# Patient Record
Sex: Male | Born: 1965 | Hispanic: No
Health system: Southern US, Community
[De-identification: ages and names within clinical notes are randomized; demographics above are authoritative.]

## PROBLEM LIST (undated history)

## (undated) DIAGNOSIS — F1910 Other psychoactive substance abuse, uncomplicated: Secondary | ICD-10-CM

## (undated) DIAGNOSIS — F319 Bipolar disorder, unspecified: Secondary | ICD-10-CM

## (undated) DIAGNOSIS — B1920 Unspecified viral hepatitis C without hepatic coma: Secondary | ICD-10-CM

## (undated) DIAGNOSIS — E039 Hypothyroidism, unspecified: Secondary | ICD-10-CM

## (undated) DIAGNOSIS — C73 Malignant neoplasm of thyroid gland: Secondary | ICD-10-CM

## (undated) DIAGNOSIS — E890 Postprocedural hypothyroidism: Secondary | ICD-10-CM

## (undated) HISTORY — DX: Other psychoactive substance abuse, uncomplicated: F19.10

## (undated) HISTORY — PX: ABDOMINAL SURGERY: SHX537

---

## 2000-12-03 ENCOUNTER — Ambulatory Visit (HOSPITAL_BASED_OUTPATIENT_CLINIC_OR_DEPARTMENT_OTHER): Admission: RE | Admit: 2000-12-03 | Discharge: 2000-12-03 | Payer: Self-pay | Admitting: Orthopedic Surgery

## 2006-07-18 ENCOUNTER — Ambulatory Visit: Payer: Self-pay | Admitting: Family Medicine

## 2006-07-25 ENCOUNTER — Ambulatory Visit: Payer: Self-pay | Admitting: Family Medicine

## 2006-08-11 ENCOUNTER — Ambulatory Visit: Payer: Self-pay | Admitting: Internal Medicine

## 2006-09-16 ENCOUNTER — Ambulatory Visit: Payer: Self-pay | Admitting: Gastroenterology

## 2006-11-06 ENCOUNTER — Ambulatory Visit: Payer: Self-pay | Admitting: Gastroenterology

## 2007-07-01 DIAGNOSIS — F319 Bipolar disorder, unspecified: Secondary | ICD-10-CM | POA: Insufficient documentation

## 2007-07-01 DIAGNOSIS — F329 Major depressive disorder, single episode, unspecified: Secondary | ICD-10-CM

## 2007-07-01 DIAGNOSIS — F411 Generalized anxiety disorder: Secondary | ICD-10-CM

## 2007-07-01 DIAGNOSIS — B1920 Unspecified viral hepatitis C without hepatic coma: Secondary | ICD-10-CM | POA: Insufficient documentation

## 2007-07-01 DIAGNOSIS — B1710 Acute hepatitis C without hepatic coma: Secondary | ICD-10-CM

## 2007-07-01 DIAGNOSIS — F4322 Adjustment disorder with anxiety: Secondary | ICD-10-CM | POA: Insufficient documentation

## 2009-05-19 ENCOUNTER — Emergency Department (HOSPITAL_COMMUNITY): Admission: EM | Admit: 2009-05-19 | Discharge: 2009-05-19 | Payer: Self-pay | Admitting: Emergency Medicine

## 2010-12-28 ENCOUNTER — Encounter: Payer: Self-pay | Admitting: Internal Medicine

## 2010-12-28 ENCOUNTER — Encounter (INDEPENDENT_AMBULATORY_CARE_PROVIDER_SITE_OTHER): Payer: Self-pay | Admitting: Internal Medicine

## 2011-01-15 NOTE — Assessment & Plan Note (Signed)
Summary: New Pt.    Vital Signs:  Patient profile:   45 year old male Height:      66.5 inches Weight:      168.25 pounds BMI:     26.85 Temp:     97.3 degrees F oral Pulse rate:   68 / minute Pulse rhythm:   regular Resp:     16 per minute BP sitting:   90 / 58  (left arm) Cuff size:   regular  Vitals Entered By: Hale Drone CMA (December 28, 2010 12:54 PM) CC: here to get established. Having right shoulder pain. Had an x-ray done last week. Needing dental referral.  Is Patient Diabetic? No Pain Assessment Patient in pain? yes     Location: right shoulder Intensity: 3 Type: aching/burning Onset of pain  Constant  Does patient need assistance? Functional Status Self care Ambulation Normal   CC:  here to get established. Having right shoulder pain. Had an x-ray done last week. Needing dental referral. .  History of Present Illness: Patient had to leave before could be seen by provider  Current Medications (verified): 1)  Wellbutrin Xl 150 Mg  Tb24 (Bupropion Hcl) .... Take 1 Tablet By Mouth Once A Day 2)  Xlapram 0.5mg  .... Take 1 Tablet By Mouth Two Times A Day 3)  Neurontin 300 Mg Caps (Gabapentin) .Marland Kitchen.. 1 Cap At Bedtime For 3 Days, Then 1 Twice A Day For 3 Days, Then 1 Cap 3 Times A Day [facility]) 4)  Trazodone Hcl 100 Mg Tabs (Trazodone Hcl) .... 1/2 To 1 Tab At Bedtime [facility]) 5)  Alprazolam 0.5 Mg Tabs (Alprazolam) .Marland Kitchen.. 1 Tab By Mouth Twice Daily [facility])  Allergies (verified): No Known Drug Allergies   Complete Medication List: 1)  Wellbutrin Xl 150 Mg Tb24 (Bupropion hcl) .... Take 1 tablet by mouth once a day 2)  Xlapram 0.5mg   .... Take 1 tablet by mouth two times a day 3)  Neurontin 300 Mg Caps (Gabapentin) .Marland Kitchen.. 1 cap at bedtime for 3 days, then 1 twice a day for 3 days, then 1 cap 3 times a day ([facility]) 4)  Trazodone Hcl 100 Mg Tabs (Trazodone hcl) .... 1/2 to 1 tab at bedtime ([facility]) 5)  Alprazolam 0.5 Mg  Tabs (Alprazolam) .Marland Kitchen.. 1 tab by mouth twice daily ([facility])    Not Administered:    Influenza Vaccine not given due to: declined
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
## 2011-04-05 NOTE — Assessment & Plan Note (Signed)
[facility]                           GASTROENTEROLOGY OFFICE NOTE   NAME:George Brady, George Brady                    MRN:          [id number]  DATE:08/11/2006                            DOB:          May 17, 1966    REASON FOR CONSULTATION:  Reflux disease.   HISTORY:  This is a 45 year old white male with recently diagnosed hepatitis  C and chronic problems with reflux disease and was referred through the  courtesy of Dr. Tawanna Cooler regarding the latter.  The patient reports a 15-year  history of problems with indigestion and heartburn.  More recently he has  had problems with early satiety over the past 6 months and about an 8-pound  weight loss.  He denies true dysphagia.  For his reflux, he is taking Pepcid  complete.  He states he has changed his diet as well as habits with some  improvement in symptoms.  He did fill a prescription for Prilosec but has  not begun the medication.   Review of Systems is unremarkable.  Rare problems with gas and constipation.  No melena or hematochezia.  He was recently establishing with Dr. Tawanna Cooler and  underwent blood work.  He was found to have abnormal hepatic transaminases  and requested hepatitis and HIV studies.  According to the patient, his  hepatitis C returned positive, and he is for additional evaluation.   Throughout the course of the interview, the patient reiterates that he does  not have health insurance and wishes not to incur any unnecessary costs  regarding his workup and treatment.  He also tells me that since changing  his eating habits, his symptoms have improved significant and wonders if he  might not just take some medication and forego any additional workup.   PAST MEDICAL HISTORY:  1. Recently diagnosed hepatitis C.  2. Anxiety and depression.  3. History of substance abuse.   PAST SURGICAL HISTORY:  1. Appendectomy.  2. Hernia repair.  3. Hemorrhoidectomy.   ALLERGIES:  No known drug  allergies.   CURRENT MEDICATIONS:  1. Wellbutrin XL 150 mg daily.  2. Alprazolam 0.5 mg b.i.d.  3. Motrin 600 mg b.i.d.  4. Multivitamins daily.  5. Fish oil daily.  6. Pepcid complete p.r.n.   FAMILY HISTORY:  No family history of gastrointestinal malignancy.   SOCIAL HISTORY:  The patient is single without children, lives with his  girlfriend.  He is a 12th grade graduate, self employed as a Music therapist.  He  smokes 1/2 pack of cigarettes per day, drinks a beer rarely, uses marijuana  daily, has used heroin in the past.   REVIEW OF SYSTEMS:  Per diagnostic evaluation form.   PHYSICAL EXAMINATION:  GENERAL: An anxious gentleman in no acute distress.  He is alert and oriented.  VITAL SIGNS: Blood pressure 100/60, heart rate 62 and regular, weight 156.6  pounds.  He is 5 feet 7-3/4 inches in height.  SKIN:  Exam reveals multiple tattoos on extremities.  HEENT:  Sclerae anicteric. Conjunctivae pink. Oral mucosa intact.  NECK:  No adenopathy.  LUNGS: Clear.  HEART: Regular.  ABDOMEN: Soft without tenderness,  mass, or hernia.  Good bowel sounds heard.  No organomegaly.  Prior surgical incision right lower quadrant is well  healed.  EXTREMITIES:  Without edema.   IMPRESSION:  1. Chronic gastroesophageal reflux disease.  GI symptoms of some concern      are early satiety and mild weight loss.  No true dysphagia.  He has not      initiated proton pump inhibitor therapy yet.  2. Recently diagnosed hepatitis C being evaluated with Dr. Tawanna Cooler.  3. History of depression and substance abuse.   RECOMMENDATIONS:  1. Initiate proton pump inhibitor therapy.  2. Discussion today regarding reflux disease and reflux precautions.      Literature provided.  3. Recommended upper endoscopy to exclude Barrett's esophagus, cancer, or      ulcer to account for some of his upper gastrointestinal symptoms.  The      patient did not wish to proceed due to cost concern.  We discussed      alternative  methods of diagnosis including upper GI series which may be      more cost effective but less sensitive.  He declined this as well and      really wanted to opt for empiric treatment of reflux disease with his      medication, ongoing adherence to reflux precautions, lifestyle      modifications, and address his issue of hepatitis with Dr. Tawanna Cooler.  I      respect his decision as he clearly understands underlying pathology      that could be overlooked.  I have given him additional literature      regarding Barrett's esophagus and upper endoscopy.  I have also      provided him with multiple samples of Prilosec 20 mg daily because of      his financial issues.   At this point, he will return to the care of Dr. Tawanna Cooler who I presume will  refer him onto the medical specialties clinic to address his hepatitis C.  He can feel free to return to this office at any time  should he be concerned about worsening or new symptoms or should he desire  screening endoscopy.                                   Wilhemina Bonito. Eda Keys., MD   JNP/MedQ  DD:  08/11/2006  DT:  08/13/2006  Job #:  [id number]   cc:   Tinnie Gens A. Tawanna Cooler, MD

## 2011-04-05 NOTE — Op Note (Signed)
[facility]  Patient:    George Brady, George Brady                    MRN: [id number] Proc. Date: 12/03/00 Attending:  Artist Pais. Mina Marble, M.D.                           Operative Report  PREOPERATIVE DIAGNOSIS:  Left index finger status post extensor tendon repair with retained suture and loss of terminal extension.  POSTOPERATIVE DIAGNOSIS:  Left index finger status post extensor tendon repair with retained suture and loss of terminal extension.  PROCEDURE:  Tenolysis extensor tendon left index finger as well as removal of suture left index finger.  SURGEON:  Artist Pais. Mina Marble, M.D.  ASSISTANT:  Junius Roads. Ireton, P.A.C.  ANESTHESIA:  General.  TOURNIQUET TIME:  30 minutes.  COMPLICATIONS:  None.  DRAINS:  None.  SPECIMENS:  None.  OPERATIVE REPORT:  Patient was taken to the operating room and after the induction of adequate general anesthesia the left upper extremity was prepped and draped in usual sterile fashion.  An Esmarch was used to exsanguinate the limb and the tourniquet was inflated to 250 mmHg.  At this point in time, using his old scar from his previous surgery, a large flap was elevated toward the ulnar side over the proximal interphalangeal joint where his extensor tendon repair had been performed.  Upon elevating the flap, there was significant amount of thick scar tissue over the extensor tendon and a sinus tract leading to the skin where some of the 4-0 Mersilene that had been used to repair his extensor tendon had worked its way toward the skin.  The sinus tract and the suture were carefully ellipsed out using the 15 blade and the sinus tract was dissected down to the repair site.  The remaining bits of 4-0 Mersilene were then carefully cut from the underlying extensor tendon.  At this point in time the tenolysis was performed using a 15 blade, excising the scar that was formed dorsally over the extensor tendon and then  over to the level of the PIP and NP joints subcutaneously.  Once this was done, hemostasis was achieved with bipolar cautery.  Irrigation was used to clean the wound and the wound was then closed with 5-0 nylon in a combination of simple and horizontal mattress sutures.  A sterile dressing of Xeroform, 4x4s, fluffs, and compressive dressing was applied.  The patient tolerated the procedure well, went to the recovery room in stable fashion. DD:  12/03/00 TD:  12/03/00 Job: [id number]

## 2011-10-22 ENCOUNTER — Emergency Department (HOSPITAL_BASED_OUTPATIENT_CLINIC_OR_DEPARTMENT_OTHER)
Admission: EM | Admit: 2011-10-22 | Discharge: 2011-10-22 | Disposition: A | Discharge status: Home / Self Care | Payer: Self-pay | Attending: Emergency Medicine | Admitting: Emergency Medicine

## 2011-10-22 DIAGNOSIS — F17200 Nicotine dependence, unspecified, uncomplicated: Secondary | ICD-10-CM | POA: Insufficient documentation

## 2011-10-22 DIAGNOSIS — K0381 Cracked tooth: Secondary | ICD-10-CM | POA: Insufficient documentation

## 2011-10-22 DIAGNOSIS — K089 Disorder of teeth and supporting structures, unspecified: Secondary | ICD-10-CM | POA: Insufficient documentation

## 2011-10-22 DIAGNOSIS — K0889 Other specified disorders of teeth and supporting structures: Secondary | ICD-10-CM

## 2011-10-22 MED ORDER — HYDROCODONE-ACETAMINOPHEN 5-325 MG PO TABS: 2.0000 | ORAL_TABLET | ORAL | Status: AC | PRN

## 2011-10-22 MED ORDER — PENICILLIN V POTASSIUM 500 MG PO TABS: 500.0000 mg | ORAL_TABLET | Freq: Four times a day (QID) | ORAL | Status: AC

## 2011-10-22 NOTE — ED Notes (Signed)
Pt reports chronic dental pain that worsened 2 days ago.

## 2011-10-22 NOTE — ED Provider Notes (Signed)
History     CSN: [id number] Arrival date & time: 10/22/2011  5:20 PM   First MD Initiated Contact with Patient 10/22/11 1738      Chief Complaint  Patient presents with  . Dental Pain    (Consider location/radiation/quality/duration/timing/severity/associated sxs/prior treatment) Patient is a 45 y.o. male presenting with tooth pain. The history is provided by the patient. No language interpreter was used.  Dental PainThe primary symptoms include mouth pain and dental injury. The symptoms began 2 days ago. The symptoms are worsening. The symptoms are new. The symptoms occur constantly.    History reviewed. No pertinent past medical history.  Past Surgical History  Procedure Date  . Abdominal surgery     No family history on file.  History  Substance Use Topics  . Smoking status: Current Everyday Smoker -- 0.5 packs/day  . Smokeless tobacco: Not on file  . Alcohol Use: Yes     occasional      Review of Systems  HENT: Positive for dental problem.   All other systems reviewed and are negative.    Allergies  Review of patient's allergies indicates no known allergies.  Home Medications   Current Outpatient Rx  Name Route Sig Dispense Refill  . BUPROPION HCL 100 MG PO TABS Oral Take by mouth 2 (two) times daily.        BP 103/42  Pulse 50  Temp(Src) 98.7 F (37.1 C) (Oral)  Resp 16  SpO2 98%  Physical Exam  Vitals reviewed. Constitutional: He appears well-developed and well-nourished.  HENT:  Head: Normocephalic.  Right Ear: External ear normal.  Left Ear: External ear normal.  Nose: Nose normal.  Mouth/Throat: Oropharynx is clear and moist.       Multiple broken teeth,    Eyes: Pupils are equal, round, and reactive to light.  Neck: Normal range of motion.  Cardiovascular: Normal rate and regular rhythm.   Pulmonary/Chest: Effort normal.  Abdominal: Soft.    ED Course  Procedures (including critical care time)  Labs Reviewed - No data to  display No results found.   No diagnosis found.    MDM  Pt given dental referral information        Langston Masker, [place] 10/22/11 4098

## 2011-10-23 NOTE — ED Provider Notes (Signed)
Medical screening examination/treatment/procedure(s) were performed by non-physician practitioner and as supervising physician I was immediately available for consultation/collaboration.   Nat Christen, MD 10/23/11 [phone]

## 2014-09-23 ENCOUNTER — Emergency Department (HOSPITAL_BASED_OUTPATIENT_CLINIC_OR_DEPARTMENT_OTHER): Payer: No Typology Code available for payment source

## 2014-09-23 ENCOUNTER — Emergency Department (HOSPITAL_BASED_OUTPATIENT_CLINIC_OR_DEPARTMENT_OTHER)
Admission: EM | Admit: 2014-09-23 | Discharge: 2014-09-23 | Disposition: A | Discharge status: Home / Self Care | Payer: No Typology Code available for payment source | Attending: Emergency Medicine | Admitting: Emergency Medicine

## 2014-09-23 ENCOUNTER — Encounter (HOSPITAL_BASED_OUTPATIENT_CLINIC_OR_DEPARTMENT_OTHER): Payer: Self-pay | Admitting: *Deleted

## 2014-09-23 DIAGNOSIS — F3110 Bipolar disorder, current episode manic without psychotic features, unspecified: Secondary | ICD-10-CM | POA: Insufficient documentation

## 2014-09-23 DIAGNOSIS — Y9389 Activity, other specified: Secondary | ICD-10-CM | POA: Insufficient documentation

## 2014-09-23 DIAGNOSIS — Y92410 Unspecified street and highway as the place of occurrence of the external cause: Secondary | ICD-10-CM | POA: Diagnosis not present

## 2014-09-23 DIAGNOSIS — M549 Dorsalgia, unspecified: Secondary | ICD-10-CM

## 2014-09-23 DIAGNOSIS — Z720 Tobacco use: Secondary | ICD-10-CM | POA: Insufficient documentation

## 2014-09-23 DIAGNOSIS — S3992XA Unspecified injury of lower back, initial encounter: Secondary | ICD-10-CM | POA: Insufficient documentation

## 2014-09-23 DIAGNOSIS — Z79899 Other long term (current) drug therapy: Secondary | ICD-10-CM | POA: Insufficient documentation

## 2014-09-23 HISTORY — DX: Bipolar disorder, unspecified: F31.9

## 2014-09-23 MED ORDER — IBUPROFEN 800 MG PO TABS
800.0000 mg | ORAL_TABLET | Freq: Once | ORAL | Status: AC
  Administered 2014-09-23: 800 mg via ORAL
  Filled 2014-09-23: qty 1

## 2014-09-23 MED ORDER — CYCLOBENZAPRINE HCL 10 MG PO TABS: 10.0000 mg | ORAL_TABLET | Freq: Two times a day (BID) | ORAL | Status: DC | PRN
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
## 2014-09-23 NOTE — ED Notes (Signed)
Unable to find pt in room. Pt left room to go outside and smoke he states when seen walking down hall by pharmacy.
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
## 2017-07-19 DIAGNOSIS — 419620001 Death: Secondary | SNOMED CT | POA: Diagnosis not present

## 2017-07-19 DEATH — deceased

## 2017-07-25 DIAGNOSIS — I10 Essential (primary) hypertension: Secondary | ICD-10-CM | POA: Diagnosis not present

## 2017-07-25 DIAGNOSIS — L908 Other atrophic disorders of skin: Secondary | ICD-10-CM | POA: Diagnosis not present

## 2017-07-25 DIAGNOSIS — H02834 Dermatochalasis of left upper eyelid: Secondary | ICD-10-CM | POA: Diagnosis not present

## 2017-07-25 DIAGNOSIS — H02831 Dermatochalasis of right upper eyelid: Secondary | ICD-10-CM | POA: Diagnosis not present

## 2017-08-18 DIAGNOSIS — 419620001 Death: Secondary | SNOMED CT | POA: Diagnosis not present

## 2017-08-18 DEATH — deceased

## 2017-09-18 DIAGNOSIS — 419620001 Death: Secondary | SNOMED CT | POA: Diagnosis not present

## 2017-09-18 DEATH — deceased

## 2017-10-18 DIAGNOSIS — 419620001 Death: Secondary | SNOMED CT | POA: Diagnosis not present

## 2017-10-18 DEATH — deceased

## 2017-11-18 DIAGNOSIS — 419620001 Death: Secondary | SNOMED CT | POA: Diagnosis not present

## 2017-11-18 DEATH — deceased

## 2017-12-12 DIAGNOSIS — J3089 Other allergic rhinitis: Secondary | ICD-10-CM | POA: Diagnosis not present

## 2017-12-12 DIAGNOSIS — J301 Allergic rhinitis due to pollen: Secondary | ICD-10-CM | POA: Diagnosis not present

## 2017-12-12 DIAGNOSIS — J3081 Allergic rhinitis due to animal (cat) (dog) hair and dander: Secondary | ICD-10-CM | POA: Diagnosis not present

## 2017-12-29 DIAGNOSIS — L97921 Non-pressure chronic ulcer of unspecified part of left lower leg limited to breakdown of skin: Secondary | ICD-10-CM | POA: Diagnosis not present

## 2017-12-29 DIAGNOSIS — I83228 Varicose veins of left lower extremity with both ulcer of other part of lower extremity and inflammation: Secondary | ICD-10-CM | POA: Diagnosis not present

## 2018-03-05 DIAGNOSIS — J3089 Other allergic rhinitis: Secondary | ICD-10-CM | POA: Diagnosis not present

## 2018-03-05 DIAGNOSIS — J301 Allergic rhinitis due to pollen: Secondary | ICD-10-CM | POA: Diagnosis not present

## 2018-03-05 DIAGNOSIS — J3081 Allergic rhinitis due to animal (cat) (dog) hair and dander: Secondary | ICD-10-CM | POA: Diagnosis not present

## 2018-03-16 DIAGNOSIS — H401123 Primary open-angle glaucoma, left eye, severe stage: Secondary | ICD-10-CM | POA: Diagnosis not present

## 2018-03-16 DIAGNOSIS — I10 Essential (primary) hypertension: Secondary | ICD-10-CM | POA: Diagnosis not present

## 2018-03-16 DIAGNOSIS — H2512 Age-related nuclear cataract, left eye: Secondary | ICD-10-CM | POA: Diagnosis not present

## 2018-03-18 DIAGNOSIS — K317 Polyp of stomach and duodenum: Secondary | ICD-10-CM | POA: Diagnosis not present

## 2018-03-30 DIAGNOSIS — I2510 Atherosclerotic heart disease of native coronary artery without angina pectoris: Secondary | ICD-10-CM | POA: Diagnosis not present

## 2018-03-30 DIAGNOSIS — I442 Atrioventricular block, complete: Secondary | ICD-10-CM | POA: Diagnosis not present

## 2018-03-30 DIAGNOSIS — I5023 Acute on chronic systolic (congestive) heart failure: Secondary | ICD-10-CM | POA: Diagnosis not present

## 2018-04-10 DIAGNOSIS — S8010XA Contusion of unspecified lower leg, initial encounter: Secondary | ICD-10-CM | POA: Diagnosis not present

## 2018-04-16 DIAGNOSIS — G40219 Localization-related (focal) (partial) symptomatic epilepsy and epileptic syndromes with complex partial seizures, intractable, without status epilepticus: Secondary | ICD-10-CM | POA: Diagnosis not present

## 2018-04-16 DIAGNOSIS — I2510 Atherosclerotic heart disease of native coronary artery without angina pectoris: Secondary | ICD-10-CM | POA: Diagnosis not present

## 2018-04-16 DIAGNOSIS — I481 Persistent atrial fibrillation: Secondary | ICD-10-CM | POA: Diagnosis not present

## 2018-04-16 DIAGNOSIS — E669 Obesity, unspecified: Secondary | ICD-10-CM | POA: Diagnosis not present

## 2018-04-16 DIAGNOSIS — I483 Typical atrial flutter: Secondary | ICD-10-CM | POA: Diagnosis not present

## 2018-04-21 DIAGNOSIS — I2510 Atherosclerotic heart disease of native coronary artery without angina pectoris: Secondary | ICD-10-CM | POA: Diagnosis not present

## 2018-04-21 DIAGNOSIS — I671 Cerebral aneurysm, nonruptured: Secondary | ICD-10-CM | POA: Diagnosis not present

## 2018-04-21 DIAGNOSIS — G40219 Localization-related (focal) (partial) symptomatic epilepsy and epileptic syndromes with complex partial seizures, intractable, without status epilepticus: Secondary | ICD-10-CM | POA: Diagnosis not present

## 2018-04-24 DIAGNOSIS — J3081 Allergic rhinitis due to animal (cat) (dog) hair and dander: Secondary | ICD-10-CM | POA: Diagnosis not present

## 2018-04-24 DIAGNOSIS — J3089 Other allergic rhinitis: Secondary | ICD-10-CM | POA: Diagnosis not present

## 2018-04-24 DIAGNOSIS — J301 Allergic rhinitis due to pollen: Secondary | ICD-10-CM | POA: Diagnosis not present

## 2018-04-27 DIAGNOSIS — H2511 Age-related nuclear cataract, right eye: Secondary | ICD-10-CM | POA: Diagnosis not present

## 2018-04-27 DIAGNOSIS — E1122 Type 2 diabetes mellitus with diabetic chronic kidney disease: Secondary | ICD-10-CM | POA: Diagnosis not present

## 2018-04-27 DIAGNOSIS — N182 Chronic kidney disease, stage 2 (mild): Secondary | ICD-10-CM | POA: Diagnosis not present

## 2018-04-27 DIAGNOSIS — Z7984 Long term (current) use of oral hypoglycemic drugs: Secondary | ICD-10-CM | POA: Diagnosis not present

## 2018-04-27 DIAGNOSIS — I10 Essential (primary) hypertension: Secondary | ICD-10-CM | POA: Diagnosis not present

## 2018-05-06 DIAGNOSIS — I480 Paroxysmal atrial fibrillation: Secondary | ICD-10-CM | POA: Diagnosis not present

## 2018-05-06 DIAGNOSIS — I5022 Chronic systolic (congestive) heart failure: Secondary | ICD-10-CM | POA: Diagnosis not present

## 2018-05-06 DIAGNOSIS — I110 Hypertensive heart disease with heart failure: Secondary | ICD-10-CM | POA: Diagnosis not present

## 2018-05-08 DIAGNOSIS — B9689 Other specified bacterial agents as the cause of diseases classified elsewhere: Secondary | ICD-10-CM | POA: Diagnosis not present

## 2018-05-08 DIAGNOSIS — Z944 Liver transplant status: Secondary | ICD-10-CM | POA: Diagnosis not present

## 2018-05-08 DIAGNOSIS — T380X5A Adverse effect of glucocorticoids and synthetic analogues, initial encounter: Secondary | ICD-10-CM | POA: Diagnosis not present

## 2018-05-14 DIAGNOSIS — M47897 Other spondylosis, lumbosacral region: Secondary | ICD-10-CM | POA: Diagnosis not present

## 2018-05-14 DIAGNOSIS — M48061 Spinal stenosis, lumbar region without neurogenic claudication: Secondary | ICD-10-CM | POA: Diagnosis not present

## 2018-05-14 DIAGNOSIS — J449 Chronic obstructive pulmonary disease, unspecified: Secondary | ICD-10-CM | POA: Diagnosis not present

## 2018-05-22 DIAGNOSIS — I272 Pulmonary hypertension, unspecified: Secondary | ICD-10-CM | POA: Diagnosis not present

## 2018-05-22 DIAGNOSIS — I110 Hypertensive heart disease with heart failure: Secondary | ICD-10-CM | POA: Diagnosis not present

## 2018-05-22 DIAGNOSIS — H268 Other specified cataract: Secondary | ICD-10-CM | POA: Diagnosis not present

## 2018-05-22 DIAGNOSIS — H52222 Regular astigmatism, left eye: Secondary | ICD-10-CM | POA: Diagnosis not present

## 2018-05-22 DIAGNOSIS — I509 Heart failure, unspecified: Secondary | ICD-10-CM | POA: Diagnosis not present

## 2018-05-26 DIAGNOSIS — T8453XD Infection and inflammatory reaction due to internal right knee prosthesis, subsequent encounter: Secondary | ICD-10-CM | POA: Diagnosis not present

## 2018-05-26 DIAGNOSIS — I5032 Chronic diastolic (congestive) heart failure: Secondary | ICD-10-CM | POA: Diagnosis not present

## 2018-05-26 DIAGNOSIS — M6281 Muscle weakness (generalized): Secondary | ICD-10-CM | POA: Diagnosis not present

## 2018-05-26 DIAGNOSIS — I959 Hypotension, unspecified: Secondary | ICD-10-CM | POA: Diagnosis not present

## 2018-05-26 DIAGNOSIS — I4891 Unspecified atrial fibrillation: Secondary | ICD-10-CM | POA: Diagnosis not present

## 2018-05-26 DIAGNOSIS — I25110 Atherosclerotic heart disease of native coronary artery with unstable angina pectoris: Secondary | ICD-10-CM | POA: Diagnosis not present

## 2018-05-26 DIAGNOSIS — E1165 Type 2 diabetes mellitus with hyperglycemia: Secondary | ICD-10-CM | POA: Diagnosis not present

## 2018-05-26 DIAGNOSIS — Z792 Long term (current) use of antibiotics: Secondary | ICD-10-CM | POA: Diagnosis not present

## 2018-05-26 DIAGNOSIS — D682 Hereditary deficiency of other clotting factors: Secondary | ICD-10-CM | POA: Diagnosis not present

## 2018-06-04 DIAGNOSIS — I10 Essential (primary) hypertension: Secondary | ICD-10-CM | POA: Diagnosis not present

## 2018-06-04 DIAGNOSIS — D125 Benign neoplasm of sigmoid colon: Secondary | ICD-10-CM | POA: Diagnosis not present

## 2018-06-04 DIAGNOSIS — K5730 Diverticulosis of large intestine without perforation or abscess without bleeding: Secondary | ICD-10-CM | POA: Diagnosis not present

## 2018-06-05 DIAGNOSIS — E039 Hypothyroidism, unspecified: Secondary | ICD-10-CM | POA: Diagnosis not present

## 2018-06-05 DIAGNOSIS — I2724 Chronic thromboembolic pulmonary hypertension: Secondary | ICD-10-CM | POA: Diagnosis not present

## 2018-06-05 DIAGNOSIS — G40219 Localization-related (focal) (partial) symptomatic epilepsy and epileptic syndromes with complex partial seizures, intractable, without status epilepticus: Secondary | ICD-10-CM | POA: Diagnosis not present

## 2018-06-05 DIAGNOSIS — G4733 Obstructive sleep apnea (adult) (pediatric): Secondary | ICD-10-CM | POA: Diagnosis not present

## 2018-06-05 DIAGNOSIS — R918 Other nonspecific abnormal finding of lung field: Secondary | ICD-10-CM | POA: Diagnosis not present

## 2018-06-08 DIAGNOSIS — I480 Paroxysmal atrial fibrillation: Secondary | ICD-10-CM | POA: Diagnosis not present

## 2018-06-10 DIAGNOSIS — I2510 Atherosclerotic heart disease of native coronary artery without angina pectoris: Secondary | ICD-10-CM | POA: Diagnosis not present

## 2018-06-10 DIAGNOSIS — I481 Persistent atrial fibrillation: Secondary | ICD-10-CM | POA: Diagnosis not present

## 2018-06-10 DIAGNOSIS — J449 Chronic obstructive pulmonary disease, unspecified: Secondary | ICD-10-CM | POA: Diagnosis not present

## 2018-06-23 DIAGNOSIS — I481 Persistent atrial fibrillation: Secondary | ICD-10-CM | POA: Diagnosis not present

## 2018-06-23 DIAGNOSIS — I2510 Atherosclerotic heart disease of native coronary artery without angina pectoris: Secondary | ICD-10-CM | POA: Diagnosis not present

## 2018-06-23 DIAGNOSIS — E785 Hyperlipidemia, unspecified: Secondary | ICD-10-CM | POA: Diagnosis not present

## 2018-07-06 DIAGNOSIS — C787 Secondary malignant neoplasm of liver and intrahepatic bile duct: Secondary | ICD-10-CM | POA: Diagnosis not present

## 2018-07-06 DIAGNOSIS — C3490 Malignant neoplasm of unspecified part of unspecified bronchus or lung: Secondary | ICD-10-CM | POA: Diagnosis not present

## 2018-07-07 DIAGNOSIS — K1321 Leukoplakia of oral mucosa, including tongue: Secondary | ICD-10-CM | POA: Diagnosis not present

## 2018-07-21 DIAGNOSIS — E785 Hyperlipidemia, unspecified: Secondary | ICD-10-CM | POA: Diagnosis not present

## 2018-07-21 DIAGNOSIS — I481 Persistent atrial fibrillation: Secondary | ICD-10-CM | POA: Diagnosis not present

## 2018-07-21 DIAGNOSIS — Z7901 Long term (current) use of anticoagulants: Secondary | ICD-10-CM | POA: Diagnosis not present

## 2018-07-22 DIAGNOSIS — J449 Chronic obstructive pulmonary disease, unspecified: Secondary | ICD-10-CM | POA: Diagnosis not present

## 2018-07-22 DIAGNOSIS — I471 Supraventricular tachycardia: Secondary | ICD-10-CM | POA: Diagnosis not present

## 2018-07-22 DIAGNOSIS — E782 Mixed hyperlipidemia: Secondary | ICD-10-CM | POA: Diagnosis not present

## 2018-07-22 DIAGNOSIS — I255 Ischemic cardiomyopathy: Secondary | ICD-10-CM | POA: Diagnosis not present

## 2018-08-06 DIAGNOSIS — K449 Diaphragmatic hernia without obstruction or gangrene: Secondary | ICD-10-CM | POA: Diagnosis not present

## 2018-08-06 DIAGNOSIS — K317 Polyp of stomach and duodenum: Secondary | ICD-10-CM | POA: Diagnosis not present

## 2018-08-06 DIAGNOSIS — K2940 Chronic atrophic gastritis without bleeding: Secondary | ICD-10-CM | POA: Diagnosis not present

## 2018-08-12 DIAGNOSIS — K5730 Diverticulosis of large intestine without perforation or abscess without bleeding: Secondary | ICD-10-CM | POA: Diagnosis not present

## 2018-08-12 DIAGNOSIS — K648 Other hemorrhoids: Secondary | ICD-10-CM | POA: Diagnosis not present

## 2018-08-12 DIAGNOSIS — K635 Polyp of colon: Secondary | ICD-10-CM | POA: Diagnosis not present

## 2018-08-24 DIAGNOSIS — H401423 Capsular glaucoma with pseudoexfoliation of lens, left eye, severe stage: Secondary | ICD-10-CM | POA: Diagnosis not present

## 2018-09-02 DIAGNOSIS — C3431 Malignant neoplasm of lower lobe, right bronchus or lung: Secondary | ICD-10-CM | POA: Diagnosis not present

## 2018-09-03 DIAGNOSIS — J449 Chronic obstructive pulmonary disease, unspecified: Secondary | ICD-10-CM | POA: Diagnosis not present

## 2018-09-10 DIAGNOSIS — K831 Obstruction of bile duct: Secondary | ICD-10-CM | POA: Diagnosis not present

## 2018-09-10 DIAGNOSIS — R17 Unspecified jaundice: Secondary | ICD-10-CM | POA: Diagnosis not present

## 2018-09-10 DIAGNOSIS — R935 Abnormal findings on diagnostic imaging of other abdominal regions, including retroperitoneum: Secondary | ICD-10-CM | POA: Diagnosis not present

## 2018-09-10 DIAGNOSIS — R932 Abnormal findings on diagnostic imaging of liver and biliary tract: Secondary | ICD-10-CM | POA: Diagnosis not present

## 2018-09-14 DIAGNOSIS — N201 Calculus of ureter: Secondary | ICD-10-CM | POA: Diagnosis not present

## 2018-09-14 DIAGNOSIS — I2510 Atherosclerotic heart disease of native coronary artery without angina pectoris: Secondary | ICD-10-CM | POA: Diagnosis not present

## 2018-09-16 DIAGNOSIS — I4819 Other persistent atrial fibrillation: Secondary | ICD-10-CM | POA: Diagnosis not present

## 2018-09-16 DIAGNOSIS — E785 Hyperlipidemia, unspecified: Secondary | ICD-10-CM | POA: Diagnosis not present

## 2018-09-16 DIAGNOSIS — I472 Ventricular tachycardia: Secondary | ICD-10-CM | POA: Diagnosis not present

## 2018-09-17 DIAGNOSIS — E119 Type 2 diabetes mellitus without complications: Secondary | ICD-10-CM | POA: Diagnosis not present

## 2018-09-17 DIAGNOSIS — I10 Essential (primary) hypertension: Secondary | ICD-10-CM | POA: Diagnosis not present

## 2018-09-17 DIAGNOSIS — I442 Atrioventricular block, complete: Secondary | ICD-10-CM | POA: Diagnosis not present

## 2018-09-18 DIAGNOSIS — K831 Obstruction of bile duct: Secondary | ICD-10-CM | POA: Diagnosis not present

## 2018-09-18 DIAGNOSIS — Z9689 Presence of other specified functional implants: Secondary | ICD-10-CM | POA: Diagnosis not present

## 2018-09-18 DIAGNOSIS — R933 Abnormal findings on diagnostic imaging of other parts of digestive tract: Secondary | ICD-10-CM | POA: Diagnosis not present

## 2018-09-18 DIAGNOSIS — K8689 Other specified diseases of pancreas: Secondary | ICD-10-CM | POA: Diagnosis not present

## 2018-09-24 DIAGNOSIS — I129 Hypertensive chronic kidney disease with stage 1 through stage 4 chronic kidney disease, or unspecified chronic kidney disease: Secondary | ICD-10-CM | POA: Diagnosis not present

## 2018-09-24 DIAGNOSIS — N189 Chronic kidney disease, unspecified: Secondary | ICD-10-CM | POA: Diagnosis not present

## 2018-09-24 DIAGNOSIS — I4811 Longstanding persistent atrial fibrillation: Secondary | ICD-10-CM | POA: Diagnosis not present

## 2018-09-24 DIAGNOSIS — I2510 Atherosclerotic heart disease of native coronary artery without angina pectoris: Secondary | ICD-10-CM | POA: Diagnosis not present

## 2018-09-25 DIAGNOSIS — Z9689 Presence of other specified functional implants: Secondary | ICD-10-CM | POA: Diagnosis not present

## 2018-09-25 DIAGNOSIS — R17 Unspecified jaundice: Secondary | ICD-10-CM | POA: Diagnosis not present

## 2018-09-30 DIAGNOSIS — E785 Hyperlipidemia, unspecified: Secondary | ICD-10-CM | POA: Diagnosis not present

## 2018-09-30 DIAGNOSIS — M5020 Other cervical disc displacement, unspecified cervical region: Secondary | ICD-10-CM | POA: Diagnosis not present

## 2018-10-04 DIAGNOSIS — R0989 Other specified symptoms and signs involving the circulatory and respiratory systems: Secondary | ICD-10-CM | POA: Diagnosis not present

## 2018-10-04 DIAGNOSIS — R51 Headache: Secondary | ICD-10-CM | POA: Diagnosis not present

## 2018-10-04 DIAGNOSIS — Z462 Encounter for fitting and adjustment of other devices related to nervous system and special senses: Secondary | ICD-10-CM | POA: Diagnosis not present

## 2018-10-19 DIAGNOSIS — I10 Essential (primary) hypertension: Secondary | ICD-10-CM | POA: Diagnosis not present

## 2018-10-19 DIAGNOSIS — H401113 Primary open-angle glaucoma, right eye, severe stage: Secondary | ICD-10-CM | POA: Diagnosis not present

## 2018-10-22 DIAGNOSIS — R5381 Other malaise: Secondary | ICD-10-CM | POA: Diagnosis not present

## 2018-10-22 DIAGNOSIS — W19XXXA Unspecified fall, initial encounter: Secondary | ICD-10-CM | POA: Diagnosis not present

## 2018-10-22 DIAGNOSIS — R279 Unspecified lack of coordination: Secondary | ICD-10-CM | POA: Diagnosis not present

## 2018-10-22 DIAGNOSIS — Z743 Need for continuous supervision: Secondary | ICD-10-CM | POA: Diagnosis not present

## 2018-11-04 DIAGNOSIS — R404 Transient alteration of awareness: Secondary | ICD-10-CM | POA: Diagnosis not present

## 2018-11-04 DIAGNOSIS — M2550 Pain in unspecified joint: Secondary | ICD-10-CM | POA: Diagnosis not present

## 2018-11-04 DIAGNOSIS — Z7401 Bed confinement status: Secondary | ICD-10-CM | POA: Diagnosis not present

## 2018-12-01 DIAGNOSIS — J449 Chronic obstructive pulmonary disease, unspecified: Secondary | ICD-10-CM | POA: Diagnosis not present

## 2018-12-05 DIAGNOSIS — M6281 Muscle weakness (generalized): Secondary | ICD-10-CM | POA: Diagnosis not present

## 2018-12-05 DIAGNOSIS — F411 Generalized anxiety disorder: Secondary | ICD-10-CM | POA: Diagnosis not present

## 2018-12-05 DIAGNOSIS — R269 Unspecified abnormalities of gait and mobility: Secondary | ICD-10-CM | POA: Diagnosis not present

## 2018-12-05 DIAGNOSIS — G20 Parkinson's disease: Secondary | ICD-10-CM | POA: Diagnosis not present

## 2018-12-05 DIAGNOSIS — I10 Essential (primary) hypertension: Secondary | ICD-10-CM | POA: Diagnosis not present

## 2018-12-05 DIAGNOSIS — R278 Other lack of coordination: Secondary | ICD-10-CM | POA: Diagnosis not present

## 2018-12-15 DIAGNOSIS — D631 Anemia in chronic kidney disease: Secondary | ICD-10-CM | POA: Diagnosis not present

## 2018-12-15 DIAGNOSIS — Z6827 Body mass index (BMI) 27.0-27.9, adult: Secondary | ICD-10-CM | POA: Diagnosis not present

## 2018-12-15 DIAGNOSIS — N2581 Secondary hyperparathyroidism of renal origin: Secondary | ICD-10-CM | POA: Diagnosis not present

## 2018-12-15 DIAGNOSIS — I129 Hypertensive chronic kidney disease with stage 1 through stage 4 chronic kidney disease, or unspecified chronic kidney disease: Secondary | ICD-10-CM | POA: Diagnosis not present

## 2018-12-15 DIAGNOSIS — Z1331 Encounter for screening for depression: Secondary | ICD-10-CM | POA: Diagnosis not present

## 2018-12-15 DIAGNOSIS — Z0000 Encounter for general adult medical examination without abnormal findings: Secondary | ICD-10-CM | POA: Diagnosis not present

## 2018-12-15 DIAGNOSIS — I4510 Unspecified right bundle-branch block: Secondary | ICD-10-CM | POA: Diagnosis not present

## 2018-12-15 DIAGNOSIS — Z1339 Encounter for screening examination for other mental health and behavioral disorders: Secondary | ICD-10-CM | POA: Diagnosis not present

## 2018-12-15 DIAGNOSIS — I10 Essential (primary) hypertension: Secondary | ICD-10-CM | POA: Diagnosis not present

## 2018-12-15 DIAGNOSIS — E559 Vitamin D deficiency, unspecified: Secondary | ICD-10-CM | POA: Diagnosis not present

## 2018-12-15 DIAGNOSIS — N184 Chronic kidney disease, stage 4 (severe): Secondary | ICD-10-CM | POA: Diagnosis not present

## 2018-12-22 DIAGNOSIS — Z0000 Encounter for general adult medical examination without abnormal findings: Secondary | ICD-10-CM | POA: Diagnosis not present

## 2018-12-22 DIAGNOSIS — L408 Other psoriasis: Secondary | ICD-10-CM | POA: Diagnosis not present

## 2018-12-22 DIAGNOSIS — Z1331 Encounter for screening for depression: Secondary | ICD-10-CM | POA: Diagnosis not present

## 2018-12-22 DIAGNOSIS — I10 Essential (primary) hypertension: Secondary | ICD-10-CM | POA: Diagnosis not present

## 2018-12-22 DIAGNOSIS — E7849 Other hyperlipidemia: Secondary | ICD-10-CM | POA: Diagnosis not present

## 2018-12-22 DIAGNOSIS — K900 Celiac disease: Secondary | ICD-10-CM | POA: Diagnosis not present

## 2018-12-22 DIAGNOSIS — J449 Chronic obstructive pulmonary disease, unspecified: Secondary | ICD-10-CM | POA: Diagnosis not present

## 2018-12-22 DIAGNOSIS — Z681 Body mass index (BMI) 19 or less, adult: Secondary | ICD-10-CM | POA: Diagnosis not present

## 2018-12-22 DIAGNOSIS — M859 Disorder of bone density and structure, unspecified: Secondary | ICD-10-CM | POA: Diagnosis not present

## 2018-12-22 DIAGNOSIS — N183 Chronic kidney disease, stage 3 (moderate): Secondary | ICD-10-CM | POA: Diagnosis not present

## 2018-12-22 DIAGNOSIS — I129 Hypertensive chronic kidney disease with stage 1 through stage 4 chronic kidney disease, or unspecified chronic kidney disease: Secondary | ICD-10-CM | POA: Diagnosis not present

## 2018-12-22 DIAGNOSIS — Z1339 Encounter for screening examination for other mental health and behavioral disorders: Secondary | ICD-10-CM | POA: Diagnosis not present

## 2018-12-23 DIAGNOSIS — R404 Transient alteration of awareness: Secondary | ICD-10-CM | POA: Diagnosis not present

## 2018-12-23 DIAGNOSIS — I469 Cardiac arrest, cause unspecified: Secondary | ICD-10-CM | POA: Diagnosis not present

## 2018-12-28 DIAGNOSIS — I872 Venous insufficiency (chronic) (peripheral): Secondary | ICD-10-CM | POA: Diagnosis not present

## 2018-12-28 DIAGNOSIS — Z6827 Body mass index (BMI) 27.0-27.9, adult: Secondary | ICD-10-CM | POA: Diagnosis not present

## 2018-12-28 DIAGNOSIS — N183 Chronic kidney disease, stage 3 (moderate): Secondary | ICD-10-CM | POA: Diagnosis not present

## 2018-12-28 DIAGNOSIS — Z1331 Encounter for screening for depression: Secondary | ICD-10-CM | POA: Diagnosis not present

## 2018-12-28 DIAGNOSIS — M1990 Unspecified osteoarthritis, unspecified site: Secondary | ICD-10-CM | POA: Diagnosis not present

## 2018-12-28 DIAGNOSIS — I129 Hypertensive chronic kidney disease with stage 1 through stage 4 chronic kidney disease, or unspecified chronic kidney disease: Secondary | ICD-10-CM | POA: Diagnosis not present

## 2018-12-28 DIAGNOSIS — Z1339 Encounter for screening examination for other mental health and behavioral disorders: Secondary | ICD-10-CM | POA: Diagnosis not present

## 2018-12-28 DIAGNOSIS — Z0000 Encounter for general adult medical examination without abnormal findings: Secondary | ICD-10-CM | POA: Diagnosis not present

## 2018-12-28 DIAGNOSIS — I8390 Asymptomatic varicose veins of unspecified lower extremity: Secondary | ICD-10-CM | POA: Diagnosis not present

## 2018-12-28 DIAGNOSIS — M419 Scoliosis, unspecified: Secondary | ICD-10-CM | POA: Diagnosis not present

## 2018-12-28 DIAGNOSIS — R5383 Other fatigue: Secondary | ICD-10-CM | POA: Diagnosis not present

## 2018-12-28 DIAGNOSIS — M545 Low back pain: Secondary | ICD-10-CM | POA: Diagnosis not present

## 2018-12-30 DIAGNOSIS — Z6824 Body mass index (BMI) 24.0-24.9, adult: Secondary | ICD-10-CM | POA: Diagnosis not present

## 2018-12-30 DIAGNOSIS — Z01419 Encounter for gynecological examination (general) (routine) without abnormal findings: Secondary | ICD-10-CM | POA: Diagnosis not present

## 2018-12-30 DIAGNOSIS — Z1211 Encounter for screening for malignant neoplasm of colon: Secondary | ICD-10-CM | POA: Diagnosis not present

## 2018-12-30 DIAGNOSIS — Z79890 Hormone replacement therapy (postmenopausal): Secondary | ICD-10-CM | POA: Diagnosis not present

## 2018-12-30 DIAGNOSIS — R928 Other abnormal and inconclusive findings on diagnostic imaging of breast: Secondary | ICD-10-CM | POA: Diagnosis not present

## 2018-12-30 DIAGNOSIS — Z09 Encounter for follow-up examination after completed treatment for conditions other than malignant neoplasm: Secondary | ICD-10-CM | POA: Diagnosis not present

## 2018-12-31 DIAGNOSIS — Z961 Presence of intraocular lens: Secondary | ICD-10-CM | POA: Diagnosis not present

## 2019-01-02 DIAGNOSIS — I739 Peripheral vascular disease, unspecified: Secondary | ICD-10-CM | POA: Diagnosis not present

## 2019-01-02 DIAGNOSIS — F0391 Unspecified dementia with behavioral disturbance: Secondary | ICD-10-CM | POA: Diagnosis not present

## 2019-01-02 DIAGNOSIS — R262 Difficulty in walking, not elsewhere classified: Secondary | ICD-10-CM | POA: Diagnosis not present

## 2019-01-02 DIAGNOSIS — E441 Mild protein-calorie malnutrition: Secondary | ICD-10-CM | POA: Diagnosis not present

## 2019-01-04 DIAGNOSIS — I499 Cardiac arrhythmia, unspecified: Secondary | ICD-10-CM | POA: Diagnosis not present

## 2019-01-04 DIAGNOSIS — R001 Bradycardia, unspecified: Secondary | ICD-10-CM | POA: Diagnosis not present

## 2019-01-04 DIAGNOSIS — R404 Transient alteration of awareness: Secondary | ICD-10-CM | POA: Diagnosis not present

## 2019-01-05 DIAGNOSIS — I499 Cardiac arrhythmia, unspecified: Secondary | ICD-10-CM | POA: Diagnosis not present

## 2019-01-05 DIAGNOSIS — R404 Transient alteration of awareness: Secondary | ICD-10-CM | POA: Diagnosis not present

## 2019-01-05 DIAGNOSIS — R001 Bradycardia, unspecified: Secondary | ICD-10-CM | POA: Diagnosis not present

## 2019-01-11 DIAGNOSIS — E538 Deficiency of other specified B group vitamins: Secondary | ICD-10-CM | POA: Diagnosis not present

## 2019-01-11 DIAGNOSIS — Z6822 Body mass index (BMI) 22.0-22.9, adult: Secondary | ICD-10-CM | POA: Diagnosis not present

## 2019-01-11 DIAGNOSIS — N401 Enlarged prostate with lower urinary tract symptoms: Secondary | ICD-10-CM | POA: Diagnosis not present

## 2019-01-11 DIAGNOSIS — Z1339 Encounter for screening examination for other mental health and behavioral disorders: Secondary | ICD-10-CM | POA: Diagnosis not present

## 2019-01-11 DIAGNOSIS — C189 Malignant neoplasm of colon, unspecified: Secondary | ICD-10-CM | POA: Diagnosis not present

## 2019-01-11 DIAGNOSIS — I10 Essential (primary) hypertension: Secondary | ICD-10-CM | POA: Diagnosis not present

## 2019-01-11 DIAGNOSIS — Z1331 Encounter for screening for depression: Secondary | ICD-10-CM | POA: Diagnosis not present

## 2019-01-11 DIAGNOSIS — D6489 Other specified anemias: Secondary | ICD-10-CM | POA: Diagnosis not present

## 2019-01-11 DIAGNOSIS — Z0000 Encounter for general adult medical examination without abnormal findings: Secondary | ICD-10-CM | POA: Diagnosis not present

## 2019-01-11 DIAGNOSIS — E7849 Other hyperlipidemia: Secondary | ICD-10-CM | POA: Diagnosis not present

## 2019-01-14 DIAGNOSIS — R079 Chest pain, unspecified: Secondary | ICD-10-CM | POA: Diagnosis not present

## 2019-01-16 DIAGNOSIS — S322XXA Fracture of coccyx, initial encounter for closed fracture: Secondary | ICD-10-CM | POA: Diagnosis not present

## 2019-01-16 DIAGNOSIS — S82421A Displaced transverse fracture of shaft of right fibula, initial encounter for closed fracture: Secondary | ICD-10-CM | POA: Diagnosis not present

## 2019-01-17 DIAGNOSIS — 419620001 Death: Secondary | SNOMED CT | POA: Diagnosis not present

## 2019-01-17 DEATH — deceased

## 2019-01-18 DIAGNOSIS — Z792 Long term (current) use of antibiotics: Secondary | ICD-10-CM | POA: Diagnosis not present

## 2019-01-18 DIAGNOSIS — Z791 Long term (current) use of non-steroidal anti-inflammatories (NSAID): Secondary | ICD-10-CM | POA: Diagnosis not present

## 2019-01-18 DIAGNOSIS — G20 Parkinson's disease: Secondary | ICD-10-CM | POA: Diagnosis not present

## 2019-01-18 DIAGNOSIS — G4709 Other insomnia: Secondary | ICD-10-CM | POA: Diagnosis not present

## 2019-01-18 DIAGNOSIS — Z96612 Presence of left artificial shoulder joint: Secondary | ICD-10-CM | POA: Diagnosis not present

## 2019-01-18 DIAGNOSIS — Z79891 Long term (current) use of opiate analgesic: Secondary | ICD-10-CM | POA: Diagnosis not present

## 2019-01-18 DIAGNOSIS — R471 Dysarthria and anarthria: Secondary | ICD-10-CM | POA: Diagnosis not present

## 2019-01-18 DIAGNOSIS — F419 Anxiety disorder, unspecified: Secondary | ICD-10-CM | POA: Diagnosis not present

## 2019-01-18 DIAGNOSIS — I10 Essential (primary) hypertension: Secondary | ICD-10-CM | POA: Diagnosis not present

## 2019-01-18 DIAGNOSIS — T8453XD Infection and inflammatory reaction due to internal right knee prosthesis, subsequent encounter: Secondary | ICD-10-CM | POA: Diagnosis not present

## 2019-01-18 DIAGNOSIS — Z87442 Personal history of urinary calculi: Secondary | ICD-10-CM | POA: Diagnosis not present

## 2019-01-18 DIAGNOSIS — H04123 Dry eye syndrome of bilateral lacrimal glands: Secondary | ICD-10-CM | POA: Diagnosis not present

## 2019-01-18 DIAGNOSIS — N3281 Overactive bladder: Secondary | ICD-10-CM | POA: Diagnosis not present

## 2019-01-18 DIAGNOSIS — Z96611 Presence of right artificial shoulder joint: Secondary | ICD-10-CM | POA: Diagnosis not present

## 2019-01-18 DIAGNOSIS — I129 Hypertensive chronic kidney disease with stage 1 through stage 4 chronic kidney disease, or unspecified chronic kidney disease: Secondary | ICD-10-CM | POA: Diagnosis not present

## 2019-01-18 DIAGNOSIS — M109 Gout, unspecified: Secondary | ICD-10-CM | POA: Diagnosis not present

## 2019-01-18 DIAGNOSIS — J309 Allergic rhinitis, unspecified: Secondary | ICD-10-CM | POA: Diagnosis not present

## 2019-01-18 DIAGNOSIS — B9689 Other specified bacterial agents as the cause of diseases classified elsewhere: Secondary | ICD-10-CM | POA: Diagnosis not present

## 2019-01-18 DIAGNOSIS — Z452 Encounter for adjustment and management of vascular access device: Secondary | ICD-10-CM | POA: Diagnosis not present

## 2019-01-18 DIAGNOSIS — Z96651 Presence of right artificial knee joint: Secondary | ICD-10-CM | POA: Diagnosis not present

## 2019-01-18 DIAGNOSIS — Z5181 Encounter for therapeutic drug level monitoring: Secondary | ICD-10-CM | POA: Diagnosis not present

## 2019-01-18 DIAGNOSIS — F329 Major depressive disorder, single episode, unspecified: Secondary | ICD-10-CM | POA: Diagnosis not present

## 2019-01-18 DIAGNOSIS — Z7982 Long term (current) use of aspirin: Secondary | ICD-10-CM | POA: Diagnosis not present

## 2019-01-18 DIAGNOSIS — K219 Gastro-esophageal reflux disease without esophagitis: Secondary | ICD-10-CM | POA: Diagnosis not present

## 2019-01-18 DIAGNOSIS — T8459XA Infection and inflammatory reaction due to other internal joint prosthesis, initial encounter: Secondary | ICD-10-CM | POA: Diagnosis not present

## 2019-01-18 DIAGNOSIS — Z8546 Personal history of malignant neoplasm of prostate: Secondary | ICD-10-CM | POA: Diagnosis not present

## 2019-01-18 DIAGNOSIS — N189 Chronic kidney disease, unspecified: Secondary | ICD-10-CM | POA: Diagnosis not present

## 2019-01-18 DIAGNOSIS — R32 Unspecified urinary incontinence: Secondary | ICD-10-CM | POA: Diagnosis not present

## 2019-01-18 DIAGNOSIS — M1990 Unspecified osteoarthritis, unspecified site: Secondary | ICD-10-CM | POA: Diagnosis not present

## 2019-01-18 DIAGNOSIS — Z96653 Presence of artificial knee joint, bilateral: Secondary | ICD-10-CM | POA: Diagnosis not present

## 2019-01-18 DIAGNOSIS — G2581 Restless legs syndrome: Secondary | ICD-10-CM | POA: Diagnosis not present

## 2019-01-19 DIAGNOSIS — Z5181 Encounter for therapeutic drug level monitoring: Secondary | ICD-10-CM | POA: Diagnosis not present

## 2019-01-19 DIAGNOSIS — A4101 Sepsis due to Methicillin susceptible Staphylococcus aureus: Secondary | ICD-10-CM | POA: Diagnosis not present

## 2019-01-19 DIAGNOSIS — F1320 Sedative, hypnotic or anxiolytic dependence, uncomplicated: Secondary | ICD-10-CM | POA: Diagnosis not present

## 2019-01-19 DIAGNOSIS — I731 Thromboangiitis obliterans [Buerger's disease]: Secondary | ICD-10-CM | POA: Diagnosis not present

## 2019-01-19 DIAGNOSIS — R339 Retention of urine, unspecified: Secondary | ICD-10-CM | POA: Diagnosis not present

## 2019-01-19 DIAGNOSIS — N136 Pyonephrosis: Secondary | ICD-10-CM | POA: Diagnosis not present

## 2019-01-19 DIAGNOSIS — M6281 Muscle weakness (generalized): Secondary | ICD-10-CM | POA: Diagnosis not present

## 2019-01-19 DIAGNOSIS — C786 Secondary malignant neoplasm of retroperitoneum and peritoneum: Secondary | ICD-10-CM | POA: Diagnosis not present

## 2019-01-19 DIAGNOSIS — J849 Interstitial pulmonary disease, unspecified: Secondary | ICD-10-CM | POA: Diagnosis not present

## 2019-01-19 DIAGNOSIS — R600 Localized edema: Secondary | ICD-10-CM | POA: Diagnosis not present

## 2019-01-19 DIAGNOSIS — Z9181 History of falling: Secondary | ICD-10-CM | POA: Diagnosis not present

## 2019-01-19 DIAGNOSIS — E1142 Type 2 diabetes mellitus with diabetic polyneuropathy: Secondary | ICD-10-CM | POA: Diagnosis not present

## 2019-01-19 DIAGNOSIS — G629 Polyneuropathy, unspecified: Secondary | ICD-10-CM | POA: Diagnosis not present

## 2019-01-19 DIAGNOSIS — I4510 Unspecified right bundle-branch block: Secondary | ICD-10-CM | POA: Diagnosis not present

## 2019-01-19 DIAGNOSIS — D61818 Other pancytopenia: Secondary | ICD-10-CM | POA: Diagnosis not present

## 2019-01-19 DIAGNOSIS — I081 Rheumatic disorders of both mitral and tricuspid valves: Secondary | ICD-10-CM | POA: Diagnosis not present

## 2019-01-19 DIAGNOSIS — R2681 Unsteadiness on feet: Secondary | ICD-10-CM | POA: Diagnosis not present

## 2019-01-19 DIAGNOSIS — Z7951 Long term (current) use of inhaled steroids: Secondary | ICD-10-CM | POA: Diagnosis not present

## 2019-01-19 DIAGNOSIS — E669 Obesity, unspecified: Secondary | ICD-10-CM | POA: Diagnosis not present

## 2019-01-19 DIAGNOSIS — I130 Hypertensive heart and chronic kidney disease with heart failure and stage 1 through stage 4 chronic kidney disease, or unspecified chronic kidney disease: Secondary | ICD-10-CM | POA: Diagnosis not present

## 2019-01-19 DIAGNOSIS — I69298 Other sequelae of other nontraumatic intracranial hemorrhage: Secondary | ICD-10-CM | POA: Diagnosis not present

## 2019-01-19 DIAGNOSIS — D709 Neutropenia, unspecified: Secondary | ICD-10-CM | POA: Diagnosis not present

## 2019-01-19 DIAGNOSIS — C50912 Malignant neoplasm of unspecified site of left female breast: Secondary | ICD-10-CM | POA: Diagnosis not present

## 2019-01-19 DIAGNOSIS — E1140 Type 2 diabetes mellitus with diabetic neuropathy, unspecified: Secondary | ICD-10-CM | POA: Diagnosis not present

## 2019-01-19 DIAGNOSIS — I252 Old myocardial infarction: Secondary | ICD-10-CM | POA: Diagnosis not present

## 2019-01-19 DIAGNOSIS — T8144XA Sepsis following a procedure, initial encounter: Secondary | ICD-10-CM | POA: Diagnosis not present

## 2019-01-19 DIAGNOSIS — S32402D Unspecified fracture of left acetabulum, subsequent encounter for fracture with routine healing: Secondary | ICD-10-CM | POA: Diagnosis not present

## 2019-01-19 DIAGNOSIS — C61 Malignant neoplasm of prostate: Secondary | ICD-10-CM | POA: Diagnosis not present

## 2019-01-19 DIAGNOSIS — K5792 Diverticulitis of intestine, part unspecified, without perforation or abscess without bleeding: Secondary | ICD-10-CM | POA: Diagnosis not present

## 2019-01-19 DIAGNOSIS — Z7952 Long term (current) use of systemic steroids: Secondary | ICD-10-CM | POA: Diagnosis not present

## 2019-01-19 DIAGNOSIS — R7303 Prediabetes: Secondary | ICD-10-CM | POA: Diagnosis not present

## 2019-01-19 DIAGNOSIS — C679 Malignant neoplasm of bladder, unspecified: Secondary | ICD-10-CM | POA: Diagnosis not present

## 2019-01-19 DIAGNOSIS — J449 Chronic obstructive pulmonary disease, unspecified: Secondary | ICD-10-CM | POA: Diagnosis not present

## 2019-01-19 DIAGNOSIS — I371 Nonrheumatic pulmonary valve insufficiency: Secondary | ICD-10-CM | POA: Diagnosis not present

## 2019-01-19 DIAGNOSIS — F419 Anxiety disorder, unspecified: Secondary | ICD-10-CM | POA: Diagnosis not present

## 2019-01-19 DIAGNOSIS — H409 Unspecified glaucoma: Secondary | ICD-10-CM | POA: Diagnosis not present

## 2019-01-19 DIAGNOSIS — J439 Emphysema, unspecified: Secondary | ICD-10-CM | POA: Diagnosis not present

## 2019-01-19 DIAGNOSIS — F329 Major depressive disorder, single episode, unspecified: Secondary | ICD-10-CM | POA: Diagnosis not present

## 2019-01-19 DIAGNOSIS — I5033 Acute on chronic diastolic (congestive) heart failure: Secondary | ICD-10-CM | POA: Diagnosis not present

## 2019-01-19 DIAGNOSIS — I110 Hypertensive heart disease with heart failure: Secondary | ICD-10-CM | POA: Diagnosis not present

## 2019-01-19 DIAGNOSIS — M109 Gout, unspecified: Secondary | ICD-10-CM | POA: Diagnosis not present

## 2019-01-19 DIAGNOSIS — R234 Changes in skin texture: Secondary | ICD-10-CM | POA: Diagnosis not present

## 2019-01-19 DIAGNOSIS — I5032 Chronic diastolic (congestive) heart failure: Secondary | ICD-10-CM | POA: Diagnosis not present

## 2019-01-19 DIAGNOSIS — Z48815 Encounter for surgical aftercare following surgery on the digestive system: Secondary | ICD-10-CM | POA: Diagnosis not present

## 2019-01-19 DIAGNOSIS — I444 Left anterior fascicular block: Secondary | ICD-10-CM | POA: Diagnosis not present

## 2019-01-19 DIAGNOSIS — C3412 Malignant neoplasm of upper lobe, left bronchus or lung: Secondary | ICD-10-CM | POA: Diagnosis not present

## 2019-01-19 DIAGNOSIS — Z955 Presence of coronary angioplasty implant and graft: Secondary | ICD-10-CM | POA: Diagnosis not present

## 2019-01-19 DIAGNOSIS — D630 Anemia in neoplastic disease: Secondary | ICD-10-CM | POA: Diagnosis not present

## 2019-01-19 DIAGNOSIS — Z8701 Personal history of pneumonia (recurrent): Secondary | ICD-10-CM | POA: Diagnosis not present

## 2019-01-19 DIAGNOSIS — N189 Chronic kidney disease, unspecified: Secondary | ICD-10-CM | POA: Diagnosis not present

## 2019-01-19 DIAGNOSIS — Z792 Long term (current) use of antibiotics: Secondary | ICD-10-CM | POA: Diagnosis not present

## 2019-01-19 DIAGNOSIS — Z87891 Personal history of nicotine dependence: Secondary | ICD-10-CM | POA: Diagnosis not present

## 2019-01-19 DIAGNOSIS — D6959 Other secondary thrombocytopenia: Secondary | ICD-10-CM | POA: Diagnosis not present

## 2019-01-19 DIAGNOSIS — J9611 Chronic respiratory failure with hypoxia: Secondary | ICD-10-CM | POA: Diagnosis not present

## 2019-01-19 DIAGNOSIS — E876 Hypokalemia: Secondary | ICD-10-CM | POA: Diagnosis not present

## 2019-01-19 DIAGNOSIS — B9562 Methicillin resistant Staphylococcus aureus infection as the cause of diseases classified elsewhere: Secondary | ICD-10-CM | POA: Diagnosis not present

## 2019-01-19 DIAGNOSIS — D649 Anemia, unspecified: Secondary | ICD-10-CM | POA: Diagnosis not present

## 2019-01-19 DIAGNOSIS — I2510 Atherosclerotic heart disease of native coronary artery without angina pectoris: Secondary | ICD-10-CM | POA: Diagnosis not present

## 2019-01-19 DIAGNOSIS — I951 Orthostatic hypotension: Secondary | ICD-10-CM | POA: Diagnosis not present

## 2019-01-19 DIAGNOSIS — K7460 Unspecified cirrhosis of liver: Secondary | ICD-10-CM | POA: Diagnosis not present

## 2019-01-19 DIAGNOSIS — Z7982 Long term (current) use of aspirin: Secondary | ICD-10-CM | POA: Diagnosis not present

## 2019-01-19 DIAGNOSIS — F39 Unspecified mood [affective] disorder: Secondary | ICD-10-CM | POA: Diagnosis not present

## 2019-01-19 DIAGNOSIS — R2689 Other abnormalities of gait and mobility: Secondary | ICD-10-CM | POA: Diagnosis not present

## 2019-01-19 DIAGNOSIS — G4733 Obstructive sleep apnea (adult) (pediatric): Secondary | ICD-10-CM | POA: Diagnosis not present

## 2019-01-19 DIAGNOSIS — Z483 Aftercare following surgery for neoplasm: Secondary | ICD-10-CM | POA: Diagnosis not present

## 2019-01-19 DIAGNOSIS — J90 Pleural effusion, not elsewhere classified: Secondary | ICD-10-CM | POA: Diagnosis not present

## 2019-01-19 DIAGNOSIS — M4145 Neuromuscular scoliosis, thoracolumbar region: Secondary | ICD-10-CM | POA: Diagnosis not present

## 2019-01-19 DIAGNOSIS — Z8679 Personal history of other diseases of the circulatory system: Secondary | ICD-10-CM | POA: Diagnosis not present

## 2019-01-19 DIAGNOSIS — I272 Pulmonary hypertension, unspecified: Secondary | ICD-10-CM | POA: Diagnosis not present

## 2019-01-19 DIAGNOSIS — Z9981 Dependence on supplemental oxygen: Secondary | ICD-10-CM | POA: Diagnosis not present

## 2019-01-19 DIAGNOSIS — T8131XA Disruption of external operation (surgical) wound, not elsewhere classified, initial encounter: Secondary | ICD-10-CM | POA: Diagnosis not present

## 2019-01-19 DIAGNOSIS — Z436 Encounter for attention to other artificial openings of urinary tract: Secondary | ICD-10-CM | POA: Diagnosis not present

## 2019-01-19 DIAGNOSIS — D696 Thrombocytopenia, unspecified: Secondary | ICD-10-CM | POA: Diagnosis not present

## 2019-01-19 DIAGNOSIS — E039 Hypothyroidism, unspecified: Secondary | ICD-10-CM | POA: Diagnosis not present

## 2019-01-19 DIAGNOSIS — I700 Atherosclerosis of aorta: Secondary | ICD-10-CM | POA: Diagnosis not present

## 2019-01-19 DIAGNOSIS — E785 Hyperlipidemia, unspecified: Secondary | ICD-10-CM | POA: Diagnosis not present

## 2019-01-19 DIAGNOSIS — M545 Low back pain: Secondary | ICD-10-CM | POA: Diagnosis not present

## 2019-01-19 DIAGNOSIS — I255 Ischemic cardiomyopathy: Secondary | ICD-10-CM | POA: Diagnosis not present

## 2019-01-19 DIAGNOSIS — G894 Chronic pain syndrome: Secondary | ICD-10-CM | POA: Diagnosis not present

## 2019-01-19 DIAGNOSIS — F17210 Nicotine dependence, cigarettes, uncomplicated: Secondary | ICD-10-CM | POA: Diagnosis not present

## 2019-01-19 DIAGNOSIS — H9190 Unspecified hearing loss, unspecified ear: Secondary | ICD-10-CM | POA: Diagnosis not present

## 2019-01-19 DIAGNOSIS — E854 Organ-limited amyloidosis: Secondary | ICD-10-CM | POA: Diagnosis not present

## 2019-01-19 DIAGNOSIS — R110 Nausea: Secondary | ICD-10-CM | POA: Diagnosis not present

## 2019-01-19 DIAGNOSIS — Z794 Long term (current) use of insulin: Secondary | ICD-10-CM | POA: Diagnosis not present

## 2019-01-19 DIAGNOSIS — C569 Malignant neoplasm of unspecified ovary: Secondary | ICD-10-CM | POA: Diagnosis not present

## 2019-01-19 DIAGNOSIS — Z7901 Long term (current) use of anticoagulants: Secondary | ICD-10-CM | POA: Diagnosis not present

## 2019-01-19 DIAGNOSIS — F0390 Unspecified dementia without behavioral disturbance: Secondary | ICD-10-CM | POA: Diagnosis not present

## 2019-01-19 DIAGNOSIS — F1120 Opioid dependence, uncomplicated: Secondary | ICD-10-CM | POA: Diagnosis not present

## 2019-01-19 DIAGNOSIS — I4891 Unspecified atrial fibrillation: Secondary | ICD-10-CM | POA: Diagnosis not present

## 2019-01-19 DIAGNOSIS — K7581 Nonalcoholic steatohepatitis (NASH): Secondary | ICD-10-CM | POA: Diagnosis not present

## 2019-01-19 DIAGNOSIS — Z452 Encounter for adjustment and management of vascular access device: Secondary | ICD-10-CM | POA: Diagnosis not present

## 2019-01-19 DIAGNOSIS — M5442 Lumbago with sciatica, left side: Secondary | ICD-10-CM | POA: Diagnosis not present

## 2019-01-19 DIAGNOSIS — H3530 Unspecified macular degeneration: Secondary | ICD-10-CM | POA: Diagnosis not present

## 2019-01-19 DIAGNOSIS — Z86711 Personal history of pulmonary embolism: Secondary | ICD-10-CM | POA: Diagnosis not present

## 2019-01-19 DIAGNOSIS — M1990 Unspecified osteoarthritis, unspecified site: Secondary | ICD-10-CM | POA: Diagnosis not present

## 2019-01-19 DIAGNOSIS — T8141XA Infection following a procedure, superficial incisional surgical site, initial encounter: Secondary | ICD-10-CM | POA: Diagnosis not present

## 2019-01-19 DIAGNOSIS — Z6835 Body mass index (BMI) 35.0-35.9, adult: Secondary | ICD-10-CM | POA: Diagnosis not present

## 2019-01-19 DIAGNOSIS — I10 Essential (primary) hypertension: Secondary | ICD-10-CM | POA: Diagnosis not present

## 2019-01-19 DIAGNOSIS — K219 Gastro-esophageal reflux disease without esophagitis: Secondary | ICD-10-CM | POA: Diagnosis not present

## 2019-01-19 DIAGNOSIS — D638 Anemia in other chronic diseases classified elsewhere: Secondary | ICD-10-CM | POA: Diagnosis not present

## 2019-01-20 DIAGNOSIS — R531 Weakness: Secondary | ICD-10-CM | POA: Diagnosis not present

## 2019-01-20 DIAGNOSIS — C7989 Secondary malignant neoplasm of other specified sites: Secondary | ICD-10-CM | POA: Diagnosis not present

## 2019-01-20 DIAGNOSIS — E785 Hyperlipidemia, unspecified: Secondary | ICD-10-CM | POA: Diagnosis not present

## 2019-01-20 DIAGNOSIS — E119 Type 2 diabetes mellitus without complications: Secondary | ICD-10-CM | POA: Diagnosis not present

## 2019-01-20 DIAGNOSIS — M5136 Other intervertebral disc degeneration, lumbar region: Secondary | ICD-10-CM | POA: Diagnosis not present

## 2019-01-20 DIAGNOSIS — I5020 Unspecified systolic (congestive) heart failure: Secondary | ICD-10-CM | POA: Diagnosis not present

## 2019-01-20 DIAGNOSIS — E1151 Type 2 diabetes mellitus with diabetic peripheral angiopathy without gangrene: Secondary | ICD-10-CM | POA: Diagnosis not present

## 2019-01-20 DIAGNOSIS — J441 Chronic obstructive pulmonary disease with (acute) exacerbation: Secondary | ICD-10-CM | POA: Diagnosis not present

## 2019-01-20 DIAGNOSIS — I480 Paroxysmal atrial fibrillation: Secondary | ICD-10-CM | POA: Diagnosis not present

## 2019-01-20 DIAGNOSIS — K219 Gastro-esophageal reflux disease without esophagitis: Secondary | ICD-10-CM | POA: Diagnosis not present

## 2019-01-20 DIAGNOSIS — N182 Chronic kidney disease, stage 2 (mild): Secondary | ICD-10-CM | POA: Diagnosis not present

## 2019-01-20 DIAGNOSIS — Z89422 Acquired absence of other left toe(s): Secondary | ICD-10-CM | POA: Diagnosis not present

## 2019-01-20 DIAGNOSIS — I495 Sick sinus syndrome: Secondary | ICD-10-CM | POA: Diagnosis not present

## 2019-01-20 DIAGNOSIS — Z8673 Personal history of transient ischemic attack (TIA), and cerebral infarction without residual deficits: Secondary | ICD-10-CM | POA: Diagnosis not present

## 2019-01-20 DIAGNOSIS — E1122 Type 2 diabetes mellitus with diabetic chronic kidney disease: Secondary | ICD-10-CM | POA: Diagnosis not present

## 2019-01-20 DIAGNOSIS — Z87891 Personal history of nicotine dependence: Secondary | ICD-10-CM | POA: Diagnosis not present

## 2019-01-20 DIAGNOSIS — Z7984 Long term (current) use of oral hypoglycemic drugs: Secondary | ICD-10-CM | POA: Diagnosis not present

## 2019-01-20 DIAGNOSIS — Z9981 Dependence on supplemental oxygen: Secondary | ICD-10-CM | POA: Diagnosis not present

## 2019-01-20 DIAGNOSIS — Z95810 Presence of automatic (implantable) cardiac defibrillator: Secondary | ICD-10-CM | POA: Diagnosis not present

## 2019-01-20 DIAGNOSIS — M5031 Other cervical disc degeneration,  high cervical region: Secondary | ICD-10-CM | POA: Diagnosis not present

## 2019-01-20 DIAGNOSIS — I130 Hypertensive heart and chronic kidney disease with heart failure and stage 1 through stage 4 chronic kidney disease, or unspecified chronic kidney disease: Secondary | ICD-10-CM | POA: Diagnosis not present

## 2019-01-20 DIAGNOSIS — Z7982 Long term (current) use of aspirin: Secondary | ICD-10-CM | POA: Diagnosis not present

## 2019-01-20 DIAGNOSIS — I509 Heart failure, unspecified: Secondary | ICD-10-CM | POA: Diagnosis not present

## 2019-01-20 DIAGNOSIS — Z6825 Body mass index (BMI) 25.0-25.9, adult: Secondary | ICD-10-CM | POA: Diagnosis not present

## 2019-01-20 DIAGNOSIS — I2510 Atherosclerotic heart disease of native coronary artery without angina pectoris: Secondary | ICD-10-CM | POA: Diagnosis not present

## 2019-01-20 DIAGNOSIS — Z7901 Long term (current) use of anticoagulants: Secondary | ICD-10-CM | POA: Diagnosis not present

## 2019-01-20 DIAGNOSIS — J439 Emphysema, unspecified: Secondary | ICD-10-CM | POA: Diagnosis not present

## 2019-01-20 DIAGNOSIS — I5033 Acute on chronic diastolic (congestive) heart failure: Secondary | ICD-10-CM | POA: Diagnosis not present

## 2019-01-20 DIAGNOSIS — I110 Hypertensive heart disease with heart failure: Secondary | ICD-10-CM | POA: Diagnosis not present

## 2019-01-20 DIAGNOSIS — F17210 Nicotine dependence, cigarettes, uncomplicated: Secondary | ICD-10-CM | POA: Diagnosis not present

## 2019-01-20 DIAGNOSIS — Z7902 Long term (current) use of antithrombotics/antiplatelets: Secondary | ICD-10-CM | POA: Diagnosis not present

## 2019-01-20 DIAGNOSIS — Z483 Aftercare following surgery for neoplasm: Secondary | ICD-10-CM | POA: Diagnosis not present

## 2019-01-20 DIAGNOSIS — I25118 Atherosclerotic heart disease of native coronary artery with other forms of angina pectoris: Secondary | ICD-10-CM | POA: Diagnosis not present

## 2019-01-20 DIAGNOSIS — F329 Major depressive disorder, single episode, unspecified: Secondary | ICD-10-CM | POA: Diagnosis not present

## 2019-01-20 DIAGNOSIS — Z950 Presence of cardiac pacemaker: Secondary | ICD-10-CM | POA: Diagnosis not present

## 2019-01-20 DIAGNOSIS — Z794 Long term (current) use of insulin: Secondary | ICD-10-CM | POA: Diagnosis not present

## 2019-01-20 DIAGNOSIS — Z8546 Personal history of malignant neoplasm of prostate: Secondary | ICD-10-CM | POA: Diagnosis not present

## 2019-01-20 DIAGNOSIS — I714 Abdominal aortic aneurysm, without rupture: Secondary | ICD-10-CM | POA: Diagnosis not present

## 2019-01-20 DIAGNOSIS — I252 Old myocardial infarction: Secondary | ICD-10-CM | POA: Diagnosis not present

## 2019-01-20 DIAGNOSIS — I5032 Chronic diastolic (congestive) heart failure: Secondary | ICD-10-CM | POA: Diagnosis not present

## 2019-01-20 DIAGNOSIS — M47813 Spondylosis without myelopathy or radiculopathy, cervicothoracic region: Secondary | ICD-10-CM | POA: Diagnosis not present

## 2019-01-20 DIAGNOSIS — F0150 Vascular dementia without behavioral disturbance: Secondary | ICD-10-CM | POA: Diagnosis not present

## 2019-01-20 DIAGNOSIS — Z9889 Other specified postprocedural states: Secondary | ICD-10-CM | POA: Diagnosis not present

## 2019-01-20 DIAGNOSIS — S32110D Nondisplaced Zone I fracture of sacrum, subsequent encounter for fracture with routine healing: Secondary | ICD-10-CM | POA: Diagnosis not present

## 2019-01-20 DIAGNOSIS — Z7951 Long term (current) use of inhaled steroids: Secondary | ICD-10-CM | POA: Diagnosis not present

## 2019-01-20 DIAGNOSIS — I872 Venous insufficiency (chronic) (peripheral): Secondary | ICD-10-CM | POA: Diagnosis not present

## 2019-01-20 DIAGNOSIS — N183 Chronic kidney disease, stage 3 (moderate): Secondary | ICD-10-CM | POA: Diagnosis not present

## 2019-01-20 DIAGNOSIS — Z5181 Encounter for therapeutic drug level monitoring: Secondary | ICD-10-CM | POA: Diagnosis not present

## 2019-01-20 DIAGNOSIS — M48061 Spinal stenosis, lumbar region without neurogenic claudication: Secondary | ICD-10-CM | POA: Diagnosis not present

## 2019-01-20 DIAGNOSIS — C679 Malignant neoplasm of bladder, unspecified: Secondary | ICD-10-CM | POA: Diagnosis not present

## 2019-01-20 DIAGNOSIS — S32511D Fracture of superior rim of right pubis, subsequent encounter for fracture with routine healing: Secondary | ICD-10-CM | POA: Diagnosis not present

## 2019-01-20 DIAGNOSIS — E663 Overweight: Secondary | ICD-10-CM | POA: Diagnosis not present

## 2019-01-20 DIAGNOSIS — N131 Hydronephrosis with ureteral stricture, not elsewhere classified: Secondary | ICD-10-CM | POA: Diagnosis not present

## 2019-01-20 DIAGNOSIS — I10 Essential (primary) hypertension: Secondary | ICD-10-CM | POA: Diagnosis not present

## 2019-01-20 DIAGNOSIS — M5442 Lumbago with sciatica, left side: Secondary | ICD-10-CM | POA: Diagnosis not present

## 2019-01-20 DIAGNOSIS — Z9181 History of falling: Secondary | ICD-10-CM | POA: Diagnosis not present

## 2019-01-20 DIAGNOSIS — Z436 Encounter for attention to other artificial openings of urinary tract: Secondary | ICD-10-CM | POA: Diagnosis not present

## 2019-01-20 DIAGNOSIS — F419 Anxiety disorder, unspecified: Secondary | ICD-10-CM | POA: Diagnosis not present

## 2019-01-20 DIAGNOSIS — Z4781 Encounter for orthopedic aftercare following surgical amputation: Secondary | ICD-10-CM | POA: Diagnosis not present

## 2019-01-20 DIAGNOSIS — R42 Dizziness and giddiness: Secondary | ICD-10-CM | POA: Diagnosis not present

## 2019-01-20 DIAGNOSIS — G8929 Other chronic pain: Secondary | ICD-10-CM | POA: Diagnosis not present

## 2019-01-21 DIAGNOSIS — I10 Essential (primary) hypertension: Secondary | ICD-10-CM | POA: Diagnosis not present

## 2019-01-21 DIAGNOSIS — J438 Other emphysema: Secondary | ICD-10-CM | POA: Diagnosis not present

## 2019-01-21 DIAGNOSIS — F988 Other specified behavioral and emotional disorders with onset usually occurring in childhood and adolescence: Secondary | ICD-10-CM | POA: Diagnosis not present

## 2019-01-21 DIAGNOSIS — M1990 Unspecified osteoarthritis, unspecified site: Secondary | ICD-10-CM | POA: Diagnosis not present

## 2019-01-21 DIAGNOSIS — I2510 Atherosclerotic heart disease of native coronary artery without angina pectoris: Secondary | ICD-10-CM | POA: Diagnosis not present

## 2019-01-21 DIAGNOSIS — F17210 Nicotine dependence, cigarettes, uncomplicated: Secondary | ICD-10-CM | POA: Diagnosis not present

## 2019-01-21 DIAGNOSIS — Z794 Long term (current) use of insulin: Secondary | ICD-10-CM | POA: Diagnosis not present

## 2019-01-21 DIAGNOSIS — Z96652 Presence of left artificial knee joint: Secondary | ICD-10-CM | POA: Diagnosis not present

## 2019-01-21 DIAGNOSIS — Z96642 Presence of left artificial hip joint: Secondary | ICD-10-CM | POA: Diagnosis not present

## 2019-01-21 DIAGNOSIS — Z79891 Long term (current) use of opiate analgesic: Secondary | ICD-10-CM | POA: Diagnosis not present

## 2019-01-21 DIAGNOSIS — Z7951 Long term (current) use of inhaled steroids: Secondary | ICD-10-CM | POA: Diagnosis not present

## 2019-01-21 DIAGNOSIS — E119 Type 2 diabetes mellitus without complications: Secondary | ICD-10-CM | POA: Diagnosis not present

## 2019-01-21 DIAGNOSIS — Z471 Aftercare following joint replacement surgery: Secondary | ICD-10-CM | POA: Diagnosis not present

## 2019-01-21 DIAGNOSIS — J45909 Unspecified asthma, uncomplicated: Secondary | ICD-10-CM | POA: Diagnosis not present

## 2019-01-21 DIAGNOSIS — Z87891 Personal history of nicotine dependence: Secondary | ICD-10-CM | POA: Diagnosis not present

## 2019-01-21 DIAGNOSIS — Z7982 Long term (current) use of aspirin: Secondary | ICD-10-CM | POA: Diagnosis not present

## 2019-01-21 DIAGNOSIS — G8918 Other acute postprocedural pain: Secondary | ICD-10-CM | POA: Diagnosis not present

## 2019-01-21 DIAGNOSIS — F329 Major depressive disorder, single episode, unspecified: Secondary | ICD-10-CM | POA: Diagnosis not present

## 2019-01-21 DIAGNOSIS — E785 Hyperlipidemia, unspecified: Secondary | ICD-10-CM | POA: Diagnosis not present

## 2019-01-21 DIAGNOSIS — Z96651 Presence of right artificial knee joint: Secondary | ICD-10-CM | POA: Diagnosis not present

## 2019-01-21 DIAGNOSIS — M48062 Spinal stenosis, lumbar region with neurogenic claudication: Secondary | ICD-10-CM | POA: Diagnosis not present

## 2019-01-21 DIAGNOSIS — Z981 Arthrodesis status: Secondary | ICD-10-CM | POA: Diagnosis not present

## 2019-01-21 DIAGNOSIS — Z4789 Encounter for other orthopedic aftercare: Secondary | ICD-10-CM | POA: Diagnosis not present

## 2019-01-22 DIAGNOSIS — I110 Hypertensive heart disease with heart failure: Secondary | ICD-10-CM | POA: Diagnosis not present

## 2019-01-22 DIAGNOSIS — M109 Gout, unspecified: Secondary | ICD-10-CM | POA: Diagnosis not present

## 2019-01-22 DIAGNOSIS — Z85810 Personal history of malignant neoplasm of tongue: Secondary | ICD-10-CM | POA: Diagnosis not present

## 2019-01-22 DIAGNOSIS — J432 Centrilobular emphysema: Secondary | ICD-10-CM | POA: Diagnosis not present

## 2019-01-22 DIAGNOSIS — Z9981 Dependence on supplemental oxygen: Secondary | ICD-10-CM | POA: Diagnosis not present

## 2019-01-22 DIAGNOSIS — K5730 Diverticulosis of large intestine without perforation or abscess without bleeding: Secondary | ICD-10-CM | POA: Diagnosis not present

## 2019-01-22 DIAGNOSIS — D500 Iron deficiency anemia secondary to blood loss (chronic): Secondary | ICD-10-CM | POA: Diagnosis not present

## 2019-01-22 DIAGNOSIS — M47816 Spondylosis without myelopathy or radiculopathy, lumbar region: Secondary | ICD-10-CM | POA: Diagnosis not present

## 2019-01-22 DIAGNOSIS — M48061 Spinal stenosis, lumbar region without neurogenic claudication: Secondary | ICD-10-CM | POA: Diagnosis not present

## 2019-01-22 DIAGNOSIS — G8929 Other chronic pain: Secondary | ICD-10-CM | POA: Diagnosis not present

## 2019-01-22 DIAGNOSIS — K769 Liver disease, unspecified: Secondary | ICD-10-CM | POA: Diagnosis not present

## 2019-01-22 DIAGNOSIS — Z86718 Personal history of other venous thrombosis and embolism: Secondary | ICD-10-CM | POA: Diagnosis not present

## 2019-01-22 DIAGNOSIS — Z452 Encounter for adjustment and management of vascular access device: Secondary | ICD-10-CM | POA: Diagnosis not present

## 2019-01-22 DIAGNOSIS — Z794 Long term (current) use of insulin: Secondary | ICD-10-CM | POA: Diagnosis not present

## 2019-01-22 DIAGNOSIS — G4733 Obstructive sleep apnea (adult) (pediatric): Secondary | ICD-10-CM | POA: Diagnosis not present

## 2019-01-22 DIAGNOSIS — J45909 Unspecified asthma, uncomplicated: Secondary | ICD-10-CM | POA: Diagnosis not present

## 2019-01-22 DIAGNOSIS — Z951 Presence of aortocoronary bypass graft: Secondary | ICD-10-CM | POA: Diagnosis not present

## 2019-01-22 DIAGNOSIS — M5117 Intervertebral disc disorders with radiculopathy, lumbosacral region: Secondary | ICD-10-CM | POA: Diagnosis not present

## 2019-01-22 DIAGNOSIS — C20 Malignant neoplasm of rectum: Secondary | ICD-10-CM | POA: Diagnosis not present

## 2019-01-22 DIAGNOSIS — J449 Chronic obstructive pulmonary disease, unspecified: Secondary | ICD-10-CM | POA: Diagnosis not present

## 2019-01-22 DIAGNOSIS — Z9181 History of falling: Secondary | ICD-10-CM | POA: Diagnosis not present

## 2019-01-22 DIAGNOSIS — Z79891 Long term (current) use of opiate analgesic: Secondary | ICD-10-CM | POA: Diagnosis not present

## 2019-01-22 DIAGNOSIS — I81 Portal vein thrombosis: Secondary | ICD-10-CM | POA: Diagnosis not present

## 2019-01-22 DIAGNOSIS — Z96642 Presence of left artificial hip joint: Secondary | ICD-10-CM | POA: Diagnosis not present

## 2019-01-22 DIAGNOSIS — D61818 Other pancytopenia: Secondary | ICD-10-CM | POA: Diagnosis not present

## 2019-01-22 DIAGNOSIS — G510 Bell's palsy: Secondary | ICD-10-CM | POA: Diagnosis not present

## 2019-01-22 DIAGNOSIS — Z7951 Long term (current) use of inhaled steroids: Secondary | ICD-10-CM | POA: Diagnosis not present

## 2019-01-22 DIAGNOSIS — M1990 Unspecified osteoarthritis, unspecified site: Secondary | ICD-10-CM | POA: Diagnosis not present

## 2019-01-22 DIAGNOSIS — G629 Polyneuropathy, unspecified: Secondary | ICD-10-CM | POA: Diagnosis not present

## 2019-01-22 DIAGNOSIS — I700 Atherosclerosis of aorta: Secondary | ICD-10-CM | POA: Diagnosis not present

## 2019-01-22 DIAGNOSIS — Z471 Aftercare following joint replacement surgery: Secondary | ICD-10-CM | POA: Diagnosis not present

## 2019-01-22 DIAGNOSIS — I5032 Chronic diastolic (congestive) heart failure: Secondary | ICD-10-CM | POA: Diagnosis not present

## 2019-01-22 DIAGNOSIS — I2510 Atherosclerotic heart disease of native coronary artery without angina pectoris: Secondary | ICD-10-CM | POA: Diagnosis not present

## 2019-01-22 DIAGNOSIS — G893 Neoplasm related pain (acute) (chronic): Secondary | ICD-10-CM | POA: Diagnosis not present

## 2019-01-22 DIAGNOSIS — F324 Major depressive disorder, single episode, in partial remission: Secondary | ICD-10-CM | POA: Diagnosis not present

## 2019-01-22 DIAGNOSIS — R911 Solitary pulmonary nodule: Secondary | ICD-10-CM | POA: Diagnosis not present

## 2019-01-22 DIAGNOSIS — E1151 Type 2 diabetes mellitus with diabetic peripheral angiopathy without gangrene: Secondary | ICD-10-CM | POA: Diagnosis not present

## 2019-01-22 DIAGNOSIS — I482 Chronic atrial fibrillation, unspecified: Secondary | ICD-10-CM | POA: Diagnosis not present

## 2019-01-22 DIAGNOSIS — I10 Essential (primary) hypertension: Secondary | ICD-10-CM | POA: Diagnosis not present

## 2019-01-22 DIAGNOSIS — Z7982 Long term (current) use of aspirin: Secondary | ICD-10-CM | POA: Diagnosis not present

## 2019-01-24 ENCOUNTER — Encounter (HOSPITAL_BASED_OUTPATIENT_CLINIC_OR_DEPARTMENT_OTHER): Payer: Self-pay | Admitting: Emergency Medicine

## 2019-01-24 ENCOUNTER — Other Ambulatory Visit: Payer: Self-pay

## 2019-01-24 ENCOUNTER — Emergency Department (HOSPITAL_BASED_OUTPATIENT_CLINIC_OR_DEPARTMENT_OTHER)
Admission: EM | Admit: 2019-01-24 | Discharge: 2019-01-24 | Disposition: A | Discharge status: Home / Self Care | Payer: Self-pay | Attending: Emergency Medicine | Admitting: Emergency Medicine

## 2019-01-24 DIAGNOSIS — F419 Anxiety disorder, unspecified: Secondary | ICD-10-CM | POA: Insufficient documentation

## 2019-01-24 DIAGNOSIS — F17200 Nicotine dependence, unspecified, uncomplicated: Secondary | ICD-10-CM | POA: Insufficient documentation

## 2019-01-24 DIAGNOSIS — K047 Periapical abscess without sinus: Secondary | ICD-10-CM | POA: Insufficient documentation

## 2019-01-24 DIAGNOSIS — F329 Major depressive disorder, single episode, unspecified: Secondary | ICD-10-CM | POA: Insufficient documentation

## 2019-01-24 DIAGNOSIS — Z79899 Other long term (current) drug therapy: Secondary | ICD-10-CM | POA: Insufficient documentation

## 2019-01-24 DIAGNOSIS — R404 Transient alteration of awareness: Secondary | ICD-10-CM | POA: Diagnosis not present

## 2019-01-24 MED ORDER — HYDROCODONE-ACETAMINOPHEN 5-325 MG PO TABS
2.0000 | ORAL_TABLET | Freq: Once | ORAL | Status: AC
  Administered 2019-01-24: 2 via ORAL
  Filled 2019-01-24: qty 2

## 2019-01-24 MED ORDER — CLINDAMYCIN HCL 300 MG PO CAPS: 300.0000 mg | ORAL_CAPSULE | Freq: Four times a day (QID) | ORAL | 0 refills | Status: AC

## 2019-01-24 MED ORDER — CLINDAMYCIN HCL 150 MG PO CAPS
300.0000 mg | ORAL_CAPSULE | Freq: Once | ORAL | Status: AC
  Administered 2019-01-24: 300 mg via ORAL
  Filled 2019-01-24: qty 2

## 2019-01-24 MED ORDER — HYDROCODONE-ACETAMINOPHEN 5-325 MG PO TABS: 1.0000 | ORAL_TABLET | Freq: Four times a day (QID) | ORAL | 0 refills | Status: DC | PRN

## 2019-01-24 NOTE — Discharge Instructions (Signed)
You can take Tylenol or Ibuprofen as directed for pain. You can alternate Tylenol and Ibuprofen every 4 hours. If you take Tylenol at 1pm, then you can take Ibuprofen at 5pm. Then you can take Tylenol again at 9pm.   Take pain medications as directed for break through pain. Do not drive or operate machinery while taking this medication.   Take antibiotics as directed. Please take all of your antibiotics until finished.  As we discussed, follow-up with 1 of the referred dentist listed in the paperwork.  Return to the emergency department tomorrow for reevaluation of your symptoms.  Return the emergency department for any worsening pain, redness or swelling of the jaw, fevers, inability to swallow your saliva, difficulty breathing, drooling, vomiting or any other worsening concerning symptoms.

## 2019-01-24 NOTE — ED Provider Notes (Signed)
[facility] EMERGENCY DEPARTMENT Provider Note   CSN: [id number] Arrival date & time: 01/24/19  4098    History   Chief Complaint Chief Complaint  Patient presents with  . Abscess    Dental    HPI George Brady is a 53 y.o. male who presents for evaluation of 3 days of facial swelling and pain. Symptoms have worsened over last 3 days.  He states that the face feels hot but he has not noticed any overlying erythema.  Patient states that he still able tolerate his secretions but does report difficulty eating secondary to pain.  Patient states he feels like the swelling has spread to his mouth, lips and tongue.  Patient states that he has been taking Goody's powder for symptoms.  He does not have a dentist he follows up with.  Patient denies any fevers, vomiting.     The history is provided by the patient.    Past Medical History:  Diagnosis Date  . Manic depressive disorder [facility])     Patient Active Problem List   Diagnosis Date Noted  . HEPATITIS C 07/01/2007  . ANXIETY 07/01/2007  . DEPRESSION 07/01/2007    Past Surgical History:  Procedure Laterality Date  . ABDOMINAL SURGERY          Home Medications    Prior to Admission medications   Medication Sig Start Date End Date Taking? Authorizing Provider  cariprazine (VRAYLAR) capsule Take by mouth.   Yes [provider]  gabapentin (NEURONTIN) 400 MG capsule Take 400 mg by mouth 3 (three) times daily.   Yes [provider]  lamoTRIgine (LAMICTAL) 100 MG tablet Take 100 mg by mouth daily.   Yes [provider]  traZODone (DESYREL) 100 MG tablet Take 100 mg by mouth at bedtime.   Yes [provider]  buPROPion (WELLBUTRIN) 100 MG tablet Take by mouth 2 (two) times daily.      [provider]  clindamycin (CLEOCIN) 300 MG capsule Take 1 capsule (300 mg total) by mouth every 6 (six) hours for 10 days. 01/24/19 02/03/19  Maxwell Caul, PA-C  cyclobenzaprine  (FLEXERIL) 10 MG tablet Take 1 tablet (10 mg total) by mouth 2 (two) times daily as needed for muscle spasms. 09/23/14   Margarita Grizzle, MD  HYDROcodone-acetaminophen (NORCO/VICODIN) 5-325 MG tablet Take 1-2 tablets by mouth every 6 (six) hours as needed. 01/24/19   Maxwell Caul, PA-C    Family History History reviewed. No pertinent family history.  Social History Social History   Tobacco Use  . Smoking status: Current Every Day Smoker    Packs/day: 0.50  . Smokeless tobacco: Never Used  Substance Use Topics  . Alcohol use: Yes    Comment: occasional  . Drug use: Never     Allergies   Patient has no known allergies.   Review of Systems Review of Systems  Constitutional: Negative for fever.  HENT: Positive for dental problem and facial swelling. Negative for drooling and trouble swallowing.   Respiratory: Negative for shortness of breath.   Gastrointestinal: Negative for vomiting.  All other systems reviewed and are negative.    Physical Exam Updated Vital Signs BP 130/83 (BP Location: Right Arm)   Pulse (!) 54   Temp (!) 97.5 F (36.4 C) (Oral)   Resp 18   Ht  (1.727 m)   Wt 68 kg   SpO2 99%   BMI 22.81 kg/m   Physical Exam Vitals signs and nursing note  reviewed.  Constitutional:      Appearance: He is well-developed.  HENT:     Head: Normocephalic and atraumatic.      Comments: Swelling is limited to right lower chin and into the right lower jaw.  No swelling noted to submandibular space.  No overlying warmth, erythema.    Mouth/Throat:     Mouth: No angioedema.     Dentition: Abnormal dentition. Gingival swelling and dental caries present. No dental abscesses.      Comments: Diffuse dental decay.  Gingival erythema and edema noted to the right lower dentition that starts beginning to spread to the submandibular space.  No oral angioedema of tongue or lips.  No obvious dental abscess.  No oral angioedema.  Airways patent, phonation is intact.  Uvula  is midline. Eyes:     General: No scleral icterus.       Right eye: No discharge.        Left eye: No discharge.     Conjunctiva/sclera: Conjunctivae normal.  Pulmonary:     Effort: Pulmonary effort is normal.  Skin:    General: Skin is warm and dry.  Neurological:     Mental Status: He is alert.  Psychiatric:        Speech: Speech normal.        Behavior: Behavior normal.      ED Treatments / Results  Labs (all labs ordered are listed, but only abnormal results are displayed) Labs Reviewed - No data to display  EKG None  Radiology No results found.  Procedures Procedures (including critical care time)  Medications Ordered in ED Medications  HYDROcodone-acetaminophen (NORCO/VICODIN) 5-325 MG per tablet 2 tablet (2 tablets Oral Given 01/24/19 1034)  clindamycin (CLEOCIN) capsule 300 mg (300 mg Oral Given 01/24/19 1035)     Initial Impression / Assessment and Plan / ED Course  I have reviewed the triage vital signs and the nursing notes.  Pertinent labs & imaging results that were available during my care of the patient were reviewed by me and considered in my medical decision making (see chart for details).        54 y.o. M who presents for evaluation of 3 days of dental abscess.  No fevers, vomiting, difficulty breathing, tolerating secretions.  Patient states he does not have a dentist. Patient is afebrile, non-toxic appearing, sitting comfortably on examination table. Vital signs reviewed and stable.  On exam, he has diffuse dental decay.  There is some gingival edema and erythema surrounding teeth numbers 2829.  No obvious area of fluctuance that would be amenable to I&D here in the ED.  He has some soft tissue swelling noted to the right lower jaw that extends into the chin.  No swelling noted to the neck, submandibular space.  No oral angioedema.  No swelling noted to the floor of mouth.  Suspect that this is periapical dental abscess.  History/physical exam not  concerning for Ludwig angina or peritonsillar abscess.  Will start patient on antibiotics.  Patient with no known drug allergies.  Patient given dental resource guide for follow-up.  Encourage follow-up within the next 24 hours or sooner if symptoms worsen. At this time, patient exhibits no emergent life-threatening condition that require further evaluation in ED or admission. Patient had ample opportunity for questions and discussion. All patient's questions were answered with full understanding. Strict return precautions discussed. Patient expresses understanding and agreement to plan.   Portions of this note were generated with Scientist, clinical (histocompatibility and immunogenetics). Dictation  errors may occur despite best attempts at proofreading.   Final Clinical Impressions(s) / ED Diagnoses   Final diagnoses:  Dental abscess    ED Discharge Orders         Ordered    clindamycin (CLEOCIN) 300 MG capsule  Every 6 hours     01/24/19 1038    HYDROcodone-acetaminophen (NORCO/VICODIN) 5-325 MG tablet  Every 6 hours PRN     01/24/19 1040           Maxwell Caul, PA-C 01/24/19 1100    Maia Plan, MD 01/24/19 2017

## 2019-01-24 NOTE — ED Triage Notes (Signed)
Patient states that he has had a dental abscess x 2 -3 days

## 2019-01-25 ENCOUNTER — Encounter (HOSPITAL_BASED_OUTPATIENT_CLINIC_OR_DEPARTMENT_OTHER): Payer: Self-pay | Admitting: *Deleted

## 2019-01-25 ENCOUNTER — Emergency Department (HOSPITAL_BASED_OUTPATIENT_CLINIC_OR_DEPARTMENT_OTHER)
Admission: EM | Admit: 2019-01-25 | Discharge: 2019-01-25 | Disposition: A | Discharge status: Home / Self Care | Payer: Self-pay | Attending: Emergency Medicine | Admitting: Emergency Medicine

## 2019-01-25 ENCOUNTER — Other Ambulatory Visit: Payer: Self-pay

## 2019-01-25 DIAGNOSIS — F17210 Nicotine dependence, cigarettes, uncomplicated: Secondary | ICD-10-CM | POA: Insufficient documentation

## 2019-01-25 DIAGNOSIS — K0889 Other specified disorders of teeth and supporting structures: Secondary | ICD-10-CM | POA: Insufficient documentation

## 2019-01-25 NOTE — ED Triage Notes (Signed)
Dental abscess recheck. States the swelling is improved but he will not be able to see a dentist.

## 2019-01-25 NOTE — ED Provider Notes (Signed)
Emergency Department Provider Note   I have reviewed the triage vital signs and the nursing notes.   HISTORY  Chief Complaint Dental Pain   HPI George Brady is a 53 y.o. male returns to the emergency department with dental swelling for reevaluation.  He was seen yesterday and evaluated and diagnosed with likely dental abscess.  He was given clindamycin which she has started taking and a list of dental resources.  He plans to make phone calls this morning in order to be seen by dentist locally.  Also discussed the dental school at [facility] as a possibility.  Patient has been taking his antibiotics and feels that the dental swelling is decreasing.  No shortness of breath, difficulty swallowing, or difficulty opening his mouth.    Past Medical History:  Diagnosis Date  . Manic depressive disorder [facility])     Patient Active Problem List   Diagnosis Date Noted  . HEPATITIS C 07/01/2007  . ANXIETY 07/01/2007  . DEPRESSION 07/01/2007    Past Surgical History:  Procedure Laterality Date  . ABDOMINAL SURGERY     Allergies Patient has no known allergies.  No family history on file.  Social History Social History   Tobacco Use  . Smoking status: Current Every Day Smoker    Packs/day: 0.50  . Smokeless tobacco: Never Used  Substance Use Topics  . Alcohol use: Yes    Comment: occasional  . Drug use: Never    Review of Systems  Constitutional: No fever/chills Eyes: No visual changes. ENT: No sore throat. Positive dental pain and swelling.  Cardiovascular: Denies chest pain. Respiratory: Denies shortness of breath. Gastrointestinal: No abdominal pain.  No nausea, no vomiting.  No diarrhea.  No constipation. Genitourinary: Negative for dysuria. Musculoskeletal: Negative for back pain. Skin: Negative for rash. Neurological: Negative for headaches, focal weakness or numbness.  10-point ROS otherwise  negative.  ____________________________________________   PHYSICAL EXAM:  VITAL SIGNS: ED Triage Vitals  Enc Vitals Group     BP 01/25/19 1116 119/88     Pulse Rate 01/25/19 1116 73     Resp 01/25/19 1116 18     Temp 01/25/19 1116 98.4 F (36.9 C)     Temp Source 01/25/19 1116 Oral     SpO2 01/25/19 1116 98 %     Weight 01/25/19 1114 150 lb 12.7 oz (68.4 kg)     Height 01/25/19 1114 5\' 8"  (1.727 m)     Pain Score 01/25/19 1113 2   Constitutional: Alert and oriented. Well appearing and in no acute distress. Eyes: Conjunctivae are normal.  Head: Atraumatic. Nose: No congestion/rhinnorhea. Mouth/Throat: Mucous membranes are moist.  Oropharynx non-erythematous. No trismus. Soft mandibular compartment. Mild mandibular swelling noted. No visible dental abscess. Well appearing and managing or secretions.  Neck: No stridor. Musculoskeletal: No gross deformities of extremities. Neurologic:  Normal speech and language. Skin:  Skin is warm, dry and intact. No rash noted.  ____________________________________________  RADIOLOGY  None ____________________________________________   PROCEDURES  Procedure(s) performed:   Procedures  None ____________________________________________   INITIAL IMPRESSION / ASSESSMENT AND PLAN / ED COURSE  Pertinent labs & imaging results that were available during my care of the patient were reviewed by me and considered in my medical decision making (see chart for details).  Patient returns to the emergency department for reevaluation at the request of the initial ED provider.  Swelling subjectively decreased according to patient.  He has mild area of swelling in the lower mandible without  concern for developing Ludwig's angina.  He has a list of dental resources from yesterday which she has brought with him.  He plans to call several dentists in the area who accept Medicaid.  Also discussed to the dental school at [facility].     ____________________________________________  FINAL CLINICAL IMPRESSION(S) / ED DIAGNOSES  Final diagnoses:  Pain, dental    Note:  This document was prepared using Dragon voice recognition software and may include unintentional dictation errors.  Alona Bene, MD Emergency Medicine    Savon Bordonaro, Arlyss Repress, MD 01/26/19 [phone]

## 2019-01-25 NOTE — Discharge Instructions (Signed)
Your swelling is looking better. Call the dentists listed on your paperwork from yesterday. Return to the ED with any new or worsening symptoms.

## 2019-01-27 DIAGNOSIS — I10 Essential (primary) hypertension: Secondary | ICD-10-CM | POA: Diagnosis not present

## 2019-01-27 DIAGNOSIS — M1612 Unilateral primary osteoarthritis, left hip: Secondary | ICD-10-CM | POA: Diagnosis not present

## 2019-01-27 DIAGNOSIS — Z7982 Long term (current) use of aspirin: Secondary | ICD-10-CM | POA: Diagnosis not present

## 2019-01-27 DIAGNOSIS — Z87891 Personal history of nicotine dependence: Secondary | ICD-10-CM | POA: Diagnosis not present

## 2019-01-27 DIAGNOSIS — Z471 Aftercare following joint replacement surgery: Secondary | ICD-10-CM | POA: Diagnosis not present

## 2019-01-27 DIAGNOSIS — Z96641 Presence of right artificial hip joint: Secondary | ICD-10-CM | POA: Diagnosis not present

## 2019-01-28 DIAGNOSIS — R4020 Unspecified coma: Secondary | ICD-10-CM | POA: Diagnosis not present

## 2019-01-28 DIAGNOSIS — R319 Hematuria, unspecified: Secondary | ICD-10-CM | POA: Diagnosis not present

## 2019-01-29 DIAGNOSIS — E11610 Type 2 diabetes mellitus with diabetic neuropathic arthropathy: Secondary | ICD-10-CM | POA: Diagnosis not present

## 2019-01-29 DIAGNOSIS — F17210 Nicotine dependence, cigarettes, uncomplicated: Secondary | ICD-10-CM | POA: Diagnosis not present

## 2019-01-29 DIAGNOSIS — E785 Hyperlipidemia, unspecified: Secondary | ICD-10-CM | POA: Diagnosis not present

## 2019-01-29 DIAGNOSIS — Z7901 Long term (current) use of anticoagulants: Secondary | ICD-10-CM | POA: Diagnosis not present

## 2019-01-29 DIAGNOSIS — E669 Obesity, unspecified: Secondary | ICD-10-CM | POA: Diagnosis not present

## 2019-01-29 DIAGNOSIS — M5416 Radiculopathy, lumbar region: Secondary | ICD-10-CM | POA: Diagnosis not present

## 2019-01-29 DIAGNOSIS — M5432 Sciatica, left side: Secondary | ICD-10-CM | POA: Diagnosis not present

## 2019-01-29 DIAGNOSIS — F419 Anxiety disorder, unspecified: Secondary | ICD-10-CM | POA: Diagnosis not present

## 2019-01-29 DIAGNOSIS — Z4789 Encounter for other orthopedic aftercare: Secondary | ICD-10-CM | POA: Diagnosis not present

## 2019-01-29 DIAGNOSIS — Z87891 Personal history of nicotine dependence: Secondary | ICD-10-CM | POA: Diagnosis not present

## 2019-01-29 DIAGNOSIS — Z86718 Personal history of other venous thrombosis and embolism: Secondary | ICD-10-CM | POA: Diagnosis not present

## 2019-01-29 DIAGNOSIS — D573 Sickle-cell trait: Secondary | ICD-10-CM | POA: Diagnosis not present

## 2019-01-29 DIAGNOSIS — I480 Paroxysmal atrial fibrillation: Secondary | ICD-10-CM | POA: Diagnosis not present

## 2019-01-29 DIAGNOSIS — Z96651 Presence of right artificial knee joint: Secondary | ICD-10-CM | POA: Diagnosis not present

## 2019-01-29 DIAGNOSIS — K922 Gastrointestinal hemorrhage, unspecified: Secondary | ICD-10-CM | POA: Diagnosis not present

## 2019-01-29 DIAGNOSIS — M069 Rheumatoid arthritis, unspecified: Secondary | ICD-10-CM | POA: Diagnosis not present

## 2019-01-29 DIAGNOSIS — D631 Anemia in chronic kidney disease: Secondary | ICD-10-CM | POA: Diagnosis not present

## 2019-01-29 DIAGNOSIS — R319 Hematuria, unspecified: Secondary | ICD-10-CM | POA: Diagnosis not present

## 2019-01-29 DIAGNOSIS — I872 Venous insufficiency (chronic) (peripheral): Secondary | ICD-10-CM | POA: Diagnosis not present

## 2019-01-29 DIAGNOSIS — R269 Unspecified abnormalities of gait and mobility: Secondary | ICD-10-CM | POA: Diagnosis not present

## 2019-01-29 DIAGNOSIS — E1151 Type 2 diabetes mellitus with diabetic peripheral angiopathy without gangrene: Secondary | ICD-10-CM | POA: Diagnosis not present

## 2019-01-29 DIAGNOSIS — Z7984 Long term (current) use of oral hypoglycemic drugs: Secondary | ICD-10-CM | POA: Diagnosis not present

## 2019-01-29 DIAGNOSIS — E1142 Type 2 diabetes mellitus with diabetic polyneuropathy: Secondary | ICD-10-CM | POA: Diagnosis not present

## 2019-01-29 DIAGNOSIS — N3000 Acute cystitis without hematuria: Secondary | ICD-10-CM | POA: Diagnosis not present

## 2019-01-29 DIAGNOSIS — E1122 Type 2 diabetes mellitus with diabetic chronic kidney disease: Secondary | ICD-10-CM | POA: Diagnosis not present

## 2019-01-29 DIAGNOSIS — M5431 Sciatica, right side: Secondary | ICD-10-CM | POA: Diagnosis not present

## 2019-01-29 DIAGNOSIS — E663 Overweight: Secondary | ICD-10-CM | POA: Diagnosis not present

## 2019-01-29 DIAGNOSIS — J9601 Acute respiratory failure with hypoxia: Secondary | ICD-10-CM | POA: Diagnosis not present

## 2019-01-29 DIAGNOSIS — I10 Essential (primary) hypertension: Secondary | ICD-10-CM | POA: Diagnosis not present

## 2019-01-29 DIAGNOSIS — I2510 Atherosclerotic heart disease of native coronary artery without angina pectoris: Secondary | ICD-10-CM | POA: Diagnosis not present

## 2019-01-29 DIAGNOSIS — J439 Emphysema, unspecified: Secondary | ICD-10-CM | POA: Diagnosis not present

## 2019-01-29 DIAGNOSIS — M1990 Unspecified osteoarthritis, unspecified site: Secondary | ICD-10-CM | POA: Diagnosis not present

## 2019-01-29 DIAGNOSIS — N183 Chronic kidney disease, stage 3 (moderate): Secondary | ICD-10-CM | POA: Diagnosis not present

## 2019-01-29 DIAGNOSIS — M1711 Unilateral primary osteoarthritis, right knee: Secondary | ICD-10-CM | POA: Diagnosis not present

## 2019-01-29 DIAGNOSIS — J189 Pneumonia, unspecified organism: Secondary | ICD-10-CM | POA: Diagnosis not present

## 2019-01-29 DIAGNOSIS — K219 Gastro-esophageal reflux disease without esophagitis: Secondary | ICD-10-CM | POA: Diagnosis not present

## 2019-01-29 DIAGNOSIS — K626 Ulcer of anus and rectum: Secondary | ICD-10-CM | POA: Diagnosis not present

## 2019-01-29 DIAGNOSIS — R4020 Unspecified coma: Secondary | ICD-10-CM | POA: Diagnosis not present

## 2019-01-29 DIAGNOSIS — I129 Hypertensive chronic kidney disease with stage 1 through stage 4 chronic kidney disease, or unspecified chronic kidney disease: Secondary | ICD-10-CM | POA: Diagnosis not present

## 2019-01-30 DIAGNOSIS — Z7982 Long term (current) use of aspirin: Secondary | ICD-10-CM | POA: Diagnosis not present

## 2019-01-30 DIAGNOSIS — I2510 Atherosclerotic heart disease of native coronary artery without angina pectoris: Secondary | ICD-10-CM | POA: Diagnosis not present

## 2019-01-30 DIAGNOSIS — M6281 Muscle weakness (generalized): Secondary | ICD-10-CM | POA: Diagnosis not present

## 2019-01-30 DIAGNOSIS — A4152 Sepsis due to Pseudomonas: Secondary | ICD-10-CM | POA: Diagnosis not present

## 2019-01-30 DIAGNOSIS — E039 Hypothyroidism, unspecified: Secondary | ICD-10-CM | POA: Diagnosis not present

## 2019-01-30 DIAGNOSIS — Z8673 Personal history of transient ischemic attack (TIA), and cerebral infarction without residual deficits: Secondary | ICD-10-CM | POA: Diagnosis not present

## 2019-01-30 DIAGNOSIS — N3281 Overactive bladder: Secondary | ICD-10-CM | POA: Diagnosis not present

## 2019-01-30 DIAGNOSIS — T8144XD Sepsis following a procedure, subsequent encounter: Secondary | ICD-10-CM | POA: Diagnosis not present

## 2019-01-30 DIAGNOSIS — R2689 Other abnormalities of gait and mobility: Secondary | ICD-10-CM | POA: Diagnosis not present

## 2019-01-30 DIAGNOSIS — Z96642 Presence of left artificial hip joint: Secondary | ICD-10-CM | POA: Diagnosis not present

## 2019-01-30 DIAGNOSIS — M4692 Unspecified inflammatory spondylopathy, cervical region: Secondary | ICD-10-CM | POA: Diagnosis not present

## 2019-01-30 DIAGNOSIS — D649 Anemia, unspecified: Secondary | ICD-10-CM | POA: Diagnosis not present

## 2019-01-30 DIAGNOSIS — M1990 Unspecified osteoarthritis, unspecified site: Secondary | ICD-10-CM | POA: Diagnosis not present

## 2019-01-30 DIAGNOSIS — Z9181 History of falling: Secondary | ICD-10-CM | POA: Diagnosis not present

## 2019-01-30 DIAGNOSIS — K219 Gastro-esophageal reflux disease without esophagitis: Secondary | ICD-10-CM | POA: Diagnosis not present

## 2019-01-30 DIAGNOSIS — R296 Repeated falls: Secondary | ICD-10-CM | POA: Diagnosis not present

## 2019-01-30 DIAGNOSIS — F0280 Dementia in other diseases classified elsewhere without behavioral disturbance: Secondary | ICD-10-CM | POA: Diagnosis not present

## 2019-01-30 DIAGNOSIS — G309 Alzheimer's disease, unspecified: Secondary | ICD-10-CM | POA: Diagnosis not present

## 2019-01-30 DIAGNOSIS — G609 Hereditary and idiopathic neuropathy, unspecified: Secondary | ICD-10-CM | POA: Diagnosis not present

## 2019-01-30 DIAGNOSIS — F339 Major depressive disorder, recurrent, unspecified: Secondary | ICD-10-CM | POA: Diagnosis not present

## 2019-01-30 DIAGNOSIS — I10 Essential (primary) hypertension: Secondary | ICD-10-CM | POA: Diagnosis not present

## 2019-01-30 DIAGNOSIS — N390 Urinary tract infection, site not specified: Secondary | ICD-10-CM | POA: Diagnosis not present

## 2019-01-30 DIAGNOSIS — N400 Enlarged prostate without lower urinary tract symptoms: Secondary | ICD-10-CM | POA: Diagnosis not present

## 2019-01-30 DIAGNOSIS — H9190 Unspecified hearing loss, unspecified ear: Secondary | ICD-10-CM | POA: Diagnosis not present

## 2019-02-01 DIAGNOSIS — R1312 Dysphagia, oropharyngeal phase: Secondary | ICD-10-CM | POA: Diagnosis not present

## 2019-02-01 DIAGNOSIS — E7800 Pure hypercholesterolemia, unspecified: Secondary | ICD-10-CM | POA: Diagnosis not present

## 2019-02-01 DIAGNOSIS — Z6824 Body mass index (BMI) 24.0-24.9, adult: Secondary | ICD-10-CM | POA: Diagnosis not present

## 2019-02-01 DIAGNOSIS — R000 Tachycardia, unspecified: Secondary | ICD-10-CM | POA: Diagnosis not present

## 2019-02-01 DIAGNOSIS — Z6826 Body mass index (BMI) 26.0-26.9, adult: Secondary | ICD-10-CM | POA: Diagnosis not present

## 2019-02-01 DIAGNOSIS — Z9181 History of falling: Secondary | ICD-10-CM | POA: Diagnosis not present

## 2019-02-01 DIAGNOSIS — I130 Hypertensive heart and chronic kidney disease with heart failure and stage 1 through stage 4 chronic kidney disease, or unspecified chronic kidney disease: Secondary | ICD-10-CM | POA: Diagnosis not present

## 2019-02-01 DIAGNOSIS — Z8553 Personal history of malignant neoplasm of renal pelvis: Secondary | ICD-10-CM | POA: Diagnosis not present

## 2019-02-01 DIAGNOSIS — F17220 Nicotine dependence, chewing tobacco, uncomplicated: Secondary | ICD-10-CM | POA: Diagnosis not present

## 2019-02-01 DIAGNOSIS — D500 Iron deficiency anemia secondary to blood loss (chronic): Secondary | ICD-10-CM | POA: Diagnosis not present

## 2019-02-01 DIAGNOSIS — C9110 Chronic lymphocytic leukemia of B-cell type not having achieved remission: Secondary | ICD-10-CM | POA: Diagnosis not present

## 2019-02-01 DIAGNOSIS — R339 Retention of urine, unspecified: Secondary | ICD-10-CM | POA: Diagnosis not present

## 2019-02-01 DIAGNOSIS — K5521 Angiodysplasia of colon with hemorrhage: Secondary | ICD-10-CM | POA: Diagnosis not present

## 2019-02-01 DIAGNOSIS — D692 Other nonthrombocytopenic purpura: Secondary | ICD-10-CM | POA: Diagnosis not present

## 2019-02-01 DIAGNOSIS — T730XXD Starvation, subsequent encounter: Secondary | ICD-10-CM | POA: Diagnosis not present

## 2019-02-01 DIAGNOSIS — J449 Chronic obstructive pulmonary disease, unspecified: Secondary | ICD-10-CM | POA: Diagnosis not present

## 2019-02-01 DIAGNOSIS — K5900 Constipation, unspecified: Secondary | ICD-10-CM | POA: Diagnosis not present

## 2019-02-01 DIAGNOSIS — Z8546 Personal history of malignant neoplasm of prostate: Secondary | ICD-10-CM | POA: Diagnosis not present

## 2019-02-01 DIAGNOSIS — M6281 Muscle weakness (generalized): Secondary | ICD-10-CM | POA: Diagnosis not present

## 2019-02-01 DIAGNOSIS — I482 Chronic atrial fibrillation, unspecified: Secondary | ICD-10-CM | POA: Diagnosis not present

## 2019-02-01 DIAGNOSIS — N183 Chronic kidney disease, stage 3 (moderate): Secondary | ICD-10-CM | POA: Diagnosis not present

## 2019-02-01 DIAGNOSIS — I69198 Other sequelae of nontraumatic intracerebral hemorrhage: Secondary | ICD-10-CM | POA: Diagnosis not present

## 2019-02-01 DIAGNOSIS — E7849 Other hyperlipidemia: Secondary | ICD-10-CM | POA: Diagnosis not present

## 2019-02-01 DIAGNOSIS — Z905 Acquired absence of kidney: Secondary | ICD-10-CM | POA: Diagnosis not present

## 2019-02-01 DIAGNOSIS — F05 Delirium due to known physiological condition: Secondary | ICD-10-CM | POA: Diagnosis not present

## 2019-02-01 DIAGNOSIS — I69118 Other symptoms and signs involving cognitive functions following nontraumatic intracerebral hemorrhage: Secondary | ICD-10-CM | POA: Diagnosis not present

## 2019-02-01 DIAGNOSIS — I69191 Dysphagia following nontraumatic intracerebral hemorrhage: Secondary | ICD-10-CM | POA: Diagnosis not present

## 2019-02-01 DIAGNOSIS — I5023 Acute on chronic systolic (congestive) heart failure: Secondary | ICD-10-CM | POA: Diagnosis not present

## 2019-02-03 DIAGNOSIS — F1020 Alcohol dependence, uncomplicated: Secondary | ICD-10-CM | POA: Diagnosis not present

## 2019-02-03 DIAGNOSIS — M19011 Primary osteoarthritis, right shoulder: Secondary | ICD-10-CM | POA: Diagnosis not present

## 2019-02-03 DIAGNOSIS — K219 Gastro-esophageal reflux disease without esophagitis: Secondary | ICD-10-CM | POA: Diagnosis not present

## 2019-02-03 DIAGNOSIS — I739 Peripheral vascular disease, unspecified: Secondary | ICD-10-CM | POA: Diagnosis not present

## 2019-02-03 DIAGNOSIS — I25810 Atherosclerosis of coronary artery bypass graft(s) without angina pectoris: Secondary | ICD-10-CM | POA: Diagnosis not present

## 2019-02-03 DIAGNOSIS — I252 Old myocardial infarction: Secondary | ICD-10-CM | POA: Diagnosis not present

## 2019-02-03 DIAGNOSIS — E782 Mixed hyperlipidemia: Secondary | ICD-10-CM | POA: Diagnosis not present

## 2019-02-03 DIAGNOSIS — J439 Emphysema, unspecified: Secondary | ICD-10-CM | POA: Diagnosis not present

## 2019-02-03 DIAGNOSIS — K852 Alcohol induced acute pancreatitis without necrosis or infection: Secondary | ICD-10-CM | POA: Diagnosis not present

## 2019-02-03 DIAGNOSIS — Z7982 Long term (current) use of aspirin: Secondary | ICD-10-CM | POA: Diagnosis not present

## 2019-02-03 DIAGNOSIS — Z8673 Personal history of transient ischemic attack (TIA), and cerebral infarction without residual deficits: Secondary | ICD-10-CM | POA: Diagnosis not present

## 2019-02-03 DIAGNOSIS — K5730 Diverticulosis of large intestine without perforation or abscess without bleeding: Secondary | ICD-10-CM | POA: Diagnosis not present

## 2019-02-03 DIAGNOSIS — G4733 Obstructive sleep apnea (adult) (pediatric): Secondary | ICD-10-CM | POA: Diagnosis not present

## 2019-02-03 DIAGNOSIS — I129 Hypertensive chronic kidney disease with stage 1 through stage 4 chronic kidney disease, or unspecified chronic kidney disease: Secondary | ICD-10-CM | POA: Diagnosis not present

## 2019-02-03 DIAGNOSIS — N189 Chronic kidney disease, unspecified: Secondary | ICD-10-CM | POA: Diagnosis not present

## 2019-02-03 DIAGNOSIS — Z95820 Peripheral vascular angioplasty status with implants and grafts: Secondary | ICD-10-CM | POA: Diagnosis not present

## 2019-02-03 DIAGNOSIS — M4800 Spinal stenosis, site unspecified: Secondary | ICD-10-CM | POA: Diagnosis not present

## 2019-02-03 DIAGNOSIS — M19012 Primary osteoarthritis, left shoulder: Secondary | ICD-10-CM | POA: Diagnosis not present

## 2019-02-03 DIAGNOSIS — F17210 Nicotine dependence, cigarettes, uncomplicated: Secondary | ICD-10-CM | POA: Diagnosis not present

## 2019-02-04 DIAGNOSIS — Z471 Aftercare following joint replacement surgery: Secondary | ICD-10-CM | POA: Diagnosis not present

## 2019-02-04 DIAGNOSIS — Z96611 Presence of right artificial shoulder joint: Secondary | ICD-10-CM | POA: Diagnosis not present

## 2019-02-04 DIAGNOSIS — Z7982 Long term (current) use of aspirin: Secondary | ICD-10-CM | POA: Diagnosis not present

## 2019-02-04 DIAGNOSIS — M75121 Complete rotator cuff tear or rupture of right shoulder, not specified as traumatic: Secondary | ICD-10-CM | POA: Diagnosis not present

## 2019-02-04 DIAGNOSIS — Z79891 Long term (current) use of opiate analgesic: Secondary | ICD-10-CM | POA: Diagnosis not present

## 2019-02-04 DIAGNOSIS — C61 Malignant neoplasm of prostate: Secondary | ICD-10-CM | POA: Diagnosis not present

## 2019-02-04 DIAGNOSIS — H9192 Unspecified hearing loss, left ear: Secondary | ICD-10-CM | POA: Diagnosis not present

## 2019-02-11 DIAGNOSIS — S82202A Unspecified fracture of shaft of left tibia, initial encounter for closed fracture: Secondary | ICD-10-CM | POA: Diagnosis not present

## 2019-02-13 DIAGNOSIS — E1122 Type 2 diabetes mellitus with diabetic chronic kidney disease: Secondary | ICD-10-CM | POA: Diagnosis not present

## 2019-02-13 DIAGNOSIS — E11319 Type 2 diabetes mellitus with unspecified diabetic retinopathy without macular edema: Secondary | ICD-10-CM | POA: Diagnosis not present

## 2019-02-13 DIAGNOSIS — N189 Chronic kidney disease, unspecified: Secondary | ICD-10-CM | POA: Diagnosis not present

## 2019-02-13 DIAGNOSIS — E1142 Type 2 diabetes mellitus with diabetic polyneuropathy: Secondary | ICD-10-CM | POA: Diagnosis not present

## 2019-02-13 DIAGNOSIS — I2510 Atherosclerotic heart disease of native coronary artery without angina pectoris: Secondary | ICD-10-CM | POA: Diagnosis not present

## 2019-02-13 DIAGNOSIS — G4733 Obstructive sleep apnea (adult) (pediatric): Secondary | ICD-10-CM | POA: Diagnosis not present

## 2019-02-13 DIAGNOSIS — Z9181 History of falling: Secondary | ICD-10-CM | POA: Diagnosis not present

## 2019-02-13 DIAGNOSIS — S32021D Stable burst fracture of second lumbar vertebra, subsequent encounter for fracture with routine healing: Secondary | ICD-10-CM | POA: Diagnosis not present

## 2019-02-13 DIAGNOSIS — J449 Chronic obstructive pulmonary disease, unspecified: Secondary | ICD-10-CM | POA: Diagnosis not present

## 2019-02-13 DIAGNOSIS — G250 Essential tremor: Secondary | ICD-10-CM | POA: Diagnosis not present

## 2019-02-13 DIAGNOSIS — Z794 Long term (current) use of insulin: Secondary | ICD-10-CM | POA: Diagnosis not present

## 2019-02-13 DIAGNOSIS — G8929 Other chronic pain: Secondary | ICD-10-CM | POA: Diagnosis not present

## 2019-02-13 DIAGNOSIS — Z7982 Long term (current) use of aspirin: Secondary | ICD-10-CM | POA: Diagnosis not present

## 2019-02-13 DIAGNOSIS — M1990 Unspecified osteoarthritis, unspecified site: Secondary | ICD-10-CM | POA: Diagnosis not present

## 2019-02-13 DIAGNOSIS — E785 Hyperlipidemia, unspecified: Secondary | ICD-10-CM | POA: Diagnosis not present

## 2019-02-13 DIAGNOSIS — M797 Fibromyalgia: Secondary | ICD-10-CM | POA: Diagnosis not present

## 2019-02-17 DIAGNOSIS — 419620001 Death: Secondary | SNOMED CT | POA: Diagnosis not present

## 2019-02-17 DEATH — deceased

## 2019-02-19 DIAGNOSIS — D500 Iron deficiency anemia secondary to blood loss (chronic): Secondary | ICD-10-CM | POA: Diagnosis not present

## 2019-02-19 DIAGNOSIS — R41841 Cognitive communication deficit: Secondary | ICD-10-CM | POA: Diagnosis not present

## 2019-02-19 DIAGNOSIS — Z483 Aftercare following surgery for neoplasm: Secondary | ICD-10-CM | POA: Diagnosis not present

## 2019-02-19 DIAGNOSIS — Z433 Encounter for attention to colostomy: Secondary | ICD-10-CM | POA: Diagnosis not present

## 2019-02-19 DIAGNOSIS — R262 Difficulty in walking, not elsewhere classified: Secondary | ICD-10-CM | POA: Diagnosis not present

## 2019-02-19 DIAGNOSIS — R279 Unspecified lack of coordination: Secondary | ICD-10-CM | POA: Diagnosis not present

## 2019-02-19 DIAGNOSIS — K5900 Constipation, unspecified: Secondary | ICD-10-CM | POA: Diagnosis not present

## 2019-02-19 DIAGNOSIS — E119 Type 2 diabetes mellitus without complications: Secondary | ICD-10-CM | POA: Diagnosis not present

## 2019-02-19 DIAGNOSIS — G3184 Mild cognitive impairment, so stated: Secondary | ICD-10-CM | POA: Diagnosis not present

## 2019-02-19 DIAGNOSIS — M6281 Muscle weakness (generalized): Secondary | ICD-10-CM | POA: Diagnosis not present

## 2019-02-19 DIAGNOSIS — E43 Unspecified severe protein-calorie malnutrition: Secondary | ICD-10-CM | POA: Diagnosis not present

## 2019-02-19 DIAGNOSIS — D6851 Activated protein C resistance: Secondary | ICD-10-CM | POA: Diagnosis not present

## 2019-02-19 DIAGNOSIS — S7012XD Contusion of left thigh, subsequent encounter: Secondary | ICD-10-CM | POA: Diagnosis not present

## 2019-02-19 DIAGNOSIS — E039 Hypothyroidism, unspecified: Secondary | ICD-10-CM | POA: Diagnosis not present

## 2019-02-19 DIAGNOSIS — C20 Malignant neoplasm of rectum: Secondary | ICD-10-CM | POA: Diagnosis not present

## 2019-02-23 DIAGNOSIS — Z452 Encounter for adjustment and management of vascular access device: Secondary | ICD-10-CM | POA: Diagnosis not present

## 2019-02-23 DIAGNOSIS — Z1211 Encounter for screening for malignant neoplasm of colon: Secondary | ICD-10-CM | POA: Diagnosis not present

## 2019-02-23 DIAGNOSIS — Z299 Encounter for prophylactic measures, unspecified: Secondary | ICD-10-CM | POA: Diagnosis not present

## 2019-02-23 DIAGNOSIS — Z6828 Body mass index (BMI) 28.0-28.9, adult: Secondary | ICD-10-CM | POA: Diagnosis not present

## 2019-02-23 DIAGNOSIS — D649 Anemia, unspecified: Secondary | ICD-10-CM | POA: Diagnosis not present

## 2019-02-23 DIAGNOSIS — L97811 Non-pressure chronic ulcer of other part of right lower leg limited to breakdown of skin: Secondary | ICD-10-CM | POA: Diagnosis not present

## 2019-02-23 DIAGNOSIS — Z1339 Encounter for screening examination for other mental health and behavioral disorders: Secondary | ICD-10-CM | POA: Diagnosis not present

## 2019-02-23 DIAGNOSIS — B9562 Methicillin resistant Staphylococcus aureus infection as the cause of diseases classified elsewhere: Secondary | ICD-10-CM | POA: Diagnosis not present

## 2019-02-23 DIAGNOSIS — I739 Peripheral vascular disease, unspecified: Secondary | ICD-10-CM | POA: Diagnosis not present

## 2019-02-23 DIAGNOSIS — I87311 Chronic venous hypertension (idiopathic) with ulcer of right lower extremity: Secondary | ICD-10-CM | POA: Diagnosis not present

## 2019-02-23 DIAGNOSIS — Z1331 Encounter for screening for depression: Secondary | ICD-10-CM | POA: Diagnosis not present

## 2019-02-23 DIAGNOSIS — R5383 Other fatigue: Secondary | ICD-10-CM | POA: Diagnosis not present

## 2019-02-23 DIAGNOSIS — Z0000 Encounter for general adult medical examination without abnormal findings: Secondary | ICD-10-CM | POA: Diagnosis not present

## 2019-02-23 DIAGNOSIS — B9689 Other specified bacterial agents as the cause of diseases classified elsewhere: Secondary | ICD-10-CM | POA: Diagnosis not present

## 2019-02-23 DIAGNOSIS — I10 Essential (primary) hypertension: Secondary | ICD-10-CM | POA: Diagnosis not present

## 2019-02-23 DIAGNOSIS — Z7189 Other specified counseling: Secondary | ICD-10-CM | POA: Diagnosis not present

## 2019-02-23 DIAGNOSIS — L03115 Cellulitis of right lower limb: Secondary | ICD-10-CM | POA: Diagnosis not present

## 2019-02-23 DIAGNOSIS — G629 Polyneuropathy, unspecified: Secondary | ICD-10-CM | POA: Diagnosis not present

## 2019-02-24 DIAGNOSIS — R197 Diarrhea, unspecified: Secondary | ICD-10-CM | POA: Diagnosis not present

## 2019-02-26 DIAGNOSIS — R002 Palpitations: Secondary | ICD-10-CM | POA: Diagnosis not present

## 2019-03-02 DIAGNOSIS — M1711 Unilateral primary osteoarthritis, right knee: Secondary | ICD-10-CM | POA: Diagnosis not present

## 2019-03-02 DIAGNOSIS — S82101A Unspecified fracture of upper end of right tibia, initial encounter for closed fracture: Secondary | ICD-10-CM | POA: Diagnosis not present

## 2019-03-02 DIAGNOSIS — J301 Allergic rhinitis due to pollen: Secondary | ICD-10-CM | POA: Diagnosis not present

## 2019-03-02 DIAGNOSIS — J3089 Other allergic rhinitis: Secondary | ICD-10-CM | POA: Diagnosis not present

## 2019-03-02 DIAGNOSIS — J3081 Allergic rhinitis due to animal (cat) (dog) hair and dander: Secondary | ICD-10-CM | POA: Diagnosis not present

## 2019-03-04 DIAGNOSIS — R350 Frequency of micturition: Secondary | ICD-10-CM | POA: Diagnosis not present

## 2019-03-06 DIAGNOSIS — M19071 Primary osteoarthritis, right ankle and foot: Secondary | ICD-10-CM | POA: Diagnosis not present

## 2019-03-06 DIAGNOSIS — Q525 Fusion of labia: Secondary | ICD-10-CM | POA: Diagnosis not present

## 2019-03-11 DIAGNOSIS — J449 Chronic obstructive pulmonary disease, unspecified: Secondary | ICD-10-CM | POA: Diagnosis not present

## 2019-03-12 ENCOUNTER — Encounter (HOSPITAL_BASED_OUTPATIENT_CLINIC_OR_DEPARTMENT_OTHER): Payer: Self-pay | Admitting: Adult Health

## 2019-03-12 ENCOUNTER — Other Ambulatory Visit: Payer: Self-pay

## 2019-03-12 ENCOUNTER — Emergency Department (HOSPITAL_BASED_OUTPATIENT_CLINIC_OR_DEPARTMENT_OTHER)
Admission: EM | Admit: 2019-03-12 | Discharge: 2019-03-12 | Disposition: A | Discharge status: Home / Self Care | Payer: Self-pay | Attending: Emergency Medicine | Admitting: Emergency Medicine

## 2019-03-12 DIAGNOSIS — N390 Urinary tract infection, site not specified: Secondary | ICD-10-CM | POA: Insufficient documentation

## 2019-03-12 DIAGNOSIS — Z79899 Other long term (current) drug therapy: Secondary | ICD-10-CM | POA: Insufficient documentation

## 2019-03-12 DIAGNOSIS — F17210 Nicotine dependence, cigarettes, uncomplicated: Secondary | ICD-10-CM | POA: Insufficient documentation

## 2019-03-12 LAB — CBC WITH DIFFERENTIAL/PLATELET
Abs Immature Granulocytes: 0.01 10*3/uL (ref 0.00–0.07)
Basophils Absolute: 0.1 10*3/uL (ref 0.0–0.1)
Basophils Relative: 1 %
Eosinophils Absolute: 0 10*3/uL (ref 0.0–0.5)
Eosinophils Relative: 0 %
HCT: 42.1 % (ref 39.0–52.0)
Hemoglobin: 14 g/dL (ref 13.0–17.0)
Immature Granulocytes: 0 %
Lymphocytes Relative: 36 %
Lymphs Abs: 2.5 10*3/uL (ref 0.7–4.0)
MCH: 30.4 pg (ref 26.0–34.0)
MCHC: 33.3 g/dL (ref 30.0–36.0)
MCV: 91.3 fL (ref 80.0–100.0)
Monocytes Absolute: 0.7 10*3/uL (ref 0.1–1.0)
Monocytes Relative: 10 %
Neutro Abs: 3.6 10*3/uL (ref 1.7–7.7)
Neutrophils Relative %: 53 %
Platelets: 277 10*3/uL (ref 150–400)
RBC: 4.61 MIL/uL (ref 4.22–5.81)
RDW: 13.3 % (ref 11.5–15.5)
WBC: 6.8 10*3/uL (ref 4.0–10.5)
nRBC: 0 % (ref 0.0–0.2)

## 2019-03-12 LAB — URINALYSIS, ROUTINE W REFLEX MICROSCOPIC
Bilirubin Urine: NEGATIVE
Glucose, UA: NEGATIVE mg/dL
Ketones, ur: NEGATIVE mg/dL
Nitrite: POSITIVE — AB
Protein, ur: NEGATIVE mg/dL
Specific Gravity, Urine: 1.02 (ref 1.005–1.030)
pH: 6.5 (ref 5.0–8.0)

## 2019-03-12 LAB — BASIC METABOLIC PANEL
Anion gap: 6 (ref 5–15)
BUN: 11 mg/dL (ref 6–20)
CO2: 22 mmol/L (ref 22–32)
Calcium: 8.6 mg/dL — ABNORMAL LOW (ref 8.9–10.3)
Chloride: 104 mmol/L (ref 98–111)
Creatinine, Ser: 0.77 mg/dL (ref 0.61–1.24)
GFR calc Af Amer: >60 mL/min (ref >60)
GFR calc non Af Amer: >60 mL/min (ref >60)
Glucose, Bld: 116 mg/dL — ABNORMAL HIGH (ref 70–99)
Potassium: 4.1 mmol/L (ref 3.5–5.1)
Sodium: 132 mmol/L — ABNORMAL LOW (ref 135–145)

## 2019-03-12 LAB — URINALYSIS, MICROSCOPIC (REFLEX): WBC, UA: >50 WBC/hpf (ref 0–5)

## 2019-03-12 MED ORDER — NITROFURANTOIN MONOHYD MACRO 100 MG PO CAPS: 100.0000 mg | ORAL_CAPSULE | Freq: Two times a day (BID) | ORAL | 0 refills | Status: DC

## 2019-03-12 MED FILL — NITROFURANTOIN MONO-MCR 100: 100 | 7 days supply | Qty: 14 | Fill #0

## 2019-03-12 NOTE — ED Provider Notes (Signed)
[facility] EMERGENCY DEPARTMENT Provider Note   CSN: [id number] Arrival date & time: 03/12/19  1203    History   Chief Complaint Chief Complaint  Patient presents with  . Hematuria    HPI George Brady is a 53 y.o. male.     Patient presents the emergency department today with complaint of dysuria, suprapubic pain, hematuria, foul odor to his urine.  No history of urinary problems or kidney stones.  Patient states that several days ago he developed a very sharp pain across his bilateral lower back that felt like someone was punching him.  He went home and had some shaking chills and noted his urine was light pink.  He did not check his temperature.  He got up the next morning and felt better but symptoms returned at night.  Since then his pain has resolved however he continues to have the symptoms listed above.  He denies any penile discharge.  He states that he has been sexually active with one partner over the past 9 years and is not concerned about sexually transmitted infection.  No nausea, vomiting.  No current flank pain or back pain.  No radiation to the lower extremities or weakness in the lower legs.     Past Medical History:  Diagnosis Date  . Manic depressive disorder [facility])     Patient Active Problem List   Diagnosis Date Noted  . HEPATITIS C 07/01/2007  . ANXIETY 07/01/2007  . DEPRESSION 07/01/2007    Past Surgical History:  Procedure Laterality Date  . ABDOMINAL SURGERY          Home Medications    Prior to Admission medications   Medication Sig Start Date End Date Taking? Authorizing Provider  buPROPion (WELLBUTRIN) 100 MG tablet Take by mouth 2 (two) times daily.      [provider]  cariprazine (VRAYLAR) capsule Take by mouth.    [provider]  cyclobenzaprine (FLEXERIL) 10 MG tablet Take 1 tablet (10 mg total) by mouth 2 (two) times daily as needed for muscle spasms. 09/23/14   Margarita Grizzle, MD  gabapentin  (NEURONTIN) 400 MG capsule Take 400 mg by mouth 3 (three) times daily.    [provider]  HYDROcodone-acetaminophen (NORCO/VICODIN) 5-325 MG tablet Take 1-2 tablets by mouth every 6 (six) hours as needed. 01/24/19   Maxwell Caul, PA-C  lamoTRIgine (LAMICTAL) 100 MG tablet Take 100 mg by mouth daily.    [provider]  traZODone (DESYREL) 100 MG tablet Take 100 mg by mouth at bedtime.    [provider]    Family History History reviewed. No pertinent family history.  Social History Social History   Tobacco Use  . Smoking status: Current Every Day Smoker    Packs/day: 0.50  . Smokeless tobacco: Never Used  Substance Use Topics  . Alcohol use: Yes    Comment: occasional  . Drug use: Never     Allergies   Patient has no known allergies.   Review of Systems Review of Systems  Constitutional: Positive for chills. Negative for fever.  HENT: Negative for rhinorrhea and sore throat.   Eyes: Negative for redness.  Respiratory: Negative for cough.   Cardiovascular: Negative for chest pain.  Gastrointestinal: Positive for abdominal pain. Negative for diarrhea, nausea and vomiting.  Genitourinary: Positive for hematuria. Negative for decreased urine volume, discharge, dysuria, flank pain, frequency and urgency.  Musculoskeletal: Positive for back pain. Negative for myalgias.  Skin: Negative for rash.  Neurological: Negative for headaches.     Physical Exam Updated Vital Signs BP 134/85 (BP Location: Right Arm)   Pulse 92   Resp 18   SpO2 98%   Physical Exam Vitals signs and nursing note reviewed.  Constitutional:      Appearance: He is well-developed.  HENT:     Head: Normocephalic and atraumatic.  Eyes:     General:        Right eye: No discharge.        Left eye: No discharge.     Conjunctiva/sclera: Conjunctivae normal.  Neck:     Musculoskeletal: Normal range of motion and neck supple.  Cardiovascular:     Rate and Rhythm: Normal  rate and regular rhythm.     Heart sounds: Normal heart sounds.  Pulmonary:     Effort: Pulmonary effort is normal.     Breath sounds: Normal breath sounds.  Abdominal:     Palpations: Abdomen is soft.     Tenderness: There is abdominal tenderness. There is no right CVA tenderness or left CVA tenderness.     Comments: Mild suprapubic pain.  Skin:    General: Skin is warm and dry.  Neurological:     Mental Status: He is alert.      ED Treatments / Results  Labs (all labs ordered are listed, but only abnormal results are displayed) Labs Reviewed  BASIC METABOLIC PANEL - Abnormal; Notable for the following components:      Result Value   Sodium 132 (*)    Glucose, Bld 116 (*)    Calcium 8.6 (*)    All other components within normal limits  URINALYSIS, ROUTINE W REFLEX MICROSCOPIC - Abnormal; Notable for the following components:   APPearance CLOUDY (*)    Hgb urine dipstick LARGE (*)    Nitrite POSITIVE (*)    Leukocytes,Ua LARGE (*)    All other components within normal limits  URINALYSIS, MICROSCOPIC (REFLEX) - Abnormal; Notable for the following components:   Bacteria, UA MANY (*)    All other components within normal limits  URINE CULTURE  CBC WITH DIFFERENTIAL/PLATELET    EKG None  Radiology No results found.  Procedures Procedures (including critical care time)  Medications Ordered in ED Medications - No data to display   Initial Impression / Assessment and Plan / ED Course  I have reviewed the triage vital signs and the nursing notes.  Pertinent labs & imaging results that were available during my care of the patient were reviewed by me and considered in my medical decision making (see chart for details).        Patient seen and examined. Work-up initiated.    Vital signs reviewed and are as follows: BP 134/85 (BP Location: Right Arm)   Pulse 92   Temp 98.3 F (36.8 C) (Oral)   Resp 18   SpO2 98%   1:00 PM urine test consistent with  infection.  Patient has greater than 50 white blood cells and positive nitrite.  Culture pending.  Blood tests are reassuring.  Will start on Macrobid.  Patient has not had any persistent fevers or chills and I have a lower suspicion for acute bacterial prostatitis.  I would prefer to avoid fluoroquinolones in this clinical presentation and patient will be given Macrobid twice daily x7 days.  We discussed signs and symptoms of pyelonephritis and signs and symptoms to return including severe abdominal pain or flank pain, fevers, vomiting.  Final Clinical Impressions(s) / ED Diagnoses  Final diagnoses:  Lower urinary tract infection    Patient with signs and symptoms of cystitis.  No convincing symptoms of pyelonephritis.  Lab work-up is reassuring.  Culture pending.  Low risk sexual history.  Will start on Macrobid and await urine cultures.  ED Discharge Orders         Ordered    nitrofurantoin, macrocrystal-monohydrate, (MACROBID) 100 MG capsule  2 times daily     03/12/19 1258           Renne Crigler, PA-C 03/12/19 1301    Geoffery Lyons, MD 03/12/19 1442

## 2019-03-12 NOTE — ED Triage Notes (Signed)
Pt reports that his lower back began to hurt a few days ago and yesterday he had hematuria and chills.

## 2019-03-12 NOTE — Discharge Instructions (Signed)
Please read and follow all provided instructions.  Your diagnoses today include:  1. Lower urinary tract infection     Tests performed today include:  Urine test - suggests that you have an infection in your bladder  Urine culture - pending results  Blood count and electrolytes - look good  Vital signs. See below for your results today.   Medications prescribed:   Macrobid - antibiotic for urinary tract infection  You have been prescribed an antibiotic medicine: take the entire course of medicine even if you are feeling better. Stopping early can cause the antibiotic not to work.  Home care instructions:  Follow any educational materials contained in this packet.  Follow-up instructions: Please follow-up with your primary care provider in 3 days if symptoms are not resolved for further evaluation of your symptoms.  Return instructions:   Please return to the Emergency Department if you experience worsening symptoms.   Return with fever, worsening pain, persistent vomiting, worsening pain in your back.   Please return if you have any other emergent concerns.  Additional Information:  Your vital signs today were: BP 134/85 (BP Location: Right Arm)    Pulse 92    Temp 98.3 F (36.8 C) (Oral)    Resp 18    SpO2 98%  If your blood pressure (BP) was elevated above 135/85 this visit, please have this repeated by your doctor within one month. --------------

## 2019-03-14 LAB — URINE CULTURE: Culture: >=100000 — AB

## 2019-03-15 ENCOUNTER — Telehealth: Payer: Self-pay | Admitting: *Deleted

## 2019-03-15 NOTE — Telephone Encounter (Signed)
Post ED Visit - Positive Culture Follow-up  Culture report reviewed by antimicrobial stewardship pharmacist: [facility] []  Enzo Bi, Pharm.D. []  Celedonio Miyamoto, [address].D., BCPS AQ-ID []  Garvin Fila, Pharm.D., BCPS []  Georgina Pillion, Pharm.D., BCPS []  [place], [address].D., BCPS, AAHIVP []  Estella Husk, Pharm.D., BCPS, AAHIVP [x]  Lysle Pearl, PharmD, BCPS []  Phillips Climes, PharmD, BCPS []  Agapito Games, PharmD, BCPS []  Verlan Friends, PharmD []  Mervyn Gay, PharmD, BCPS []  Vinnie Level, PharmD  [facility] []  Len Childs, PharmD []  Greer Pickerel, PharmD []  Adalberto Cole, PharmD []  Perlie Gold, Rph []  Lonell Face) Jean Rosenthal, PharmD []  Earl Many, PharmD []  Junita Push, PharmD []  Dorna Leitz, PharmD []  Terrilee Files, PharmD []  Lynann Beaver, PharmD []  Keturah Barre, PharmD []  Loralee Pacas, PharmD []  Bernadene Person, PharmD   Positive urine culture Treated with Nitrofurantoin Monohyd Macro, organism sensitive to the same and no further patient follow-up is required at this time.  Virl Axe [facility] 03/15/2019, 12:42 PM

## 2019-03-16 DIAGNOSIS — D122 Benign neoplasm of ascending colon: Secondary | ICD-10-CM | POA: Diagnosis not present

## 2019-03-16 DIAGNOSIS — K5140 Inflammatory polyps of colon without complications: Secondary | ICD-10-CM | POA: Diagnosis not present

## 2019-03-17 DIAGNOSIS — S72002A Fracture of unspecified part of neck of left femur, initial encounter for closed fracture: Secondary | ICD-10-CM | POA: Diagnosis not present

## 2019-03-17 DIAGNOSIS — L570 Actinic keratosis: Secondary | ICD-10-CM | POA: Diagnosis not present

## 2019-03-17 DIAGNOSIS — L814 Other melanin hyperpigmentation: Secondary | ICD-10-CM | POA: Diagnosis not present

## 2019-03-17 DIAGNOSIS — J449 Chronic obstructive pulmonary disease, unspecified: Secondary | ICD-10-CM | POA: Diagnosis not present

## 2019-03-17 DIAGNOSIS — E039 Hypothyroidism, unspecified: Secondary | ICD-10-CM | POA: Diagnosis not present

## 2019-03-19 DIAGNOSIS — M7138 Other bursal cyst, other site: Secondary | ICD-10-CM | POA: Diagnosis not present

## 2019-03-19 DIAGNOSIS — J449 Chronic obstructive pulmonary disease, unspecified: Secondary | ICD-10-CM | POA: Diagnosis not present

## 2019-03-19 DIAGNOSIS — F329 Major depressive disorder, single episode, unspecified: Secondary | ICD-10-CM | POA: Diagnosis not present

## 2019-03-19 DIAGNOSIS — M5416 Radiculopathy, lumbar region: Secondary | ICD-10-CM | POA: Diagnosis not present

## 2019-03-20 ENCOUNTER — Emergency Department (HOSPITAL_BASED_OUTPATIENT_CLINIC_OR_DEPARTMENT_OTHER)
Admission: EM | Admit: 2019-03-20 | Discharge: 2019-03-20 | Disposition: A | Discharge status: Home / Self Care | Payer: Self-pay | Attending: Emergency Medicine | Admitting: Emergency Medicine

## 2019-03-20 ENCOUNTER — Other Ambulatory Visit: Payer: Self-pay

## 2019-03-20 ENCOUNTER — Emergency Department (HOSPITAL_BASED_OUTPATIENT_CLINIC_OR_DEPARTMENT_OTHER): Payer: Self-pay

## 2019-03-20 ENCOUNTER — Encounter (HOSPITAL_BASED_OUTPATIENT_CLINIC_OR_DEPARTMENT_OTHER): Payer: Self-pay | Admitting: Emergency Medicine

## 2019-03-20 DIAGNOSIS — B9620 Unspecified Escherichia coli [E. coli] as the cause of diseases classified elsewhere: Secondary | ICD-10-CM | POA: Insufficient documentation

## 2019-03-20 DIAGNOSIS — F17210 Nicotine dependence, cigarettes, uncomplicated: Secondary | ICD-10-CM | POA: Insufficient documentation

## 2019-03-20 DIAGNOSIS — B182 Chronic viral hepatitis C: Secondary | ICD-10-CM | POA: Insufficient documentation

## 2019-03-20 DIAGNOSIS — N390 Urinary tract infection, site not specified: Secondary | ICD-10-CM | POA: Insufficient documentation

## 2019-03-20 DIAGNOSIS — Z79899 Other long term (current) drug therapy: Secondary | ICD-10-CM | POA: Insufficient documentation

## 2019-03-20 DIAGNOSIS — R109 Unspecified abdominal pain: Secondary | ICD-10-CM

## 2019-03-20 HISTORY — DX: Unspecified viral hepatitis C without hepatic coma: B19.20

## 2019-03-20 LAB — CBC WITH DIFFERENTIAL/PLATELET
Abs Immature Granulocytes: 0.04 10*3/uL (ref 0.00–0.07)
Basophils Absolute: 0.1 10*3/uL (ref 0.0–0.1)
Basophils Relative: 1 %
Eosinophils Absolute: 0.1 10*3/uL (ref 0.0–0.5)
Eosinophils Relative: 1 %
HCT: 42.5 % (ref 39.0–52.0)
Hemoglobin: 14.2 g/dL (ref 13.0–17.0)
Immature Granulocytes: 0 %
Lymphocytes Relative: 25 %
Lymphs Abs: 2.9 10*3/uL (ref 0.7–4.0)
MCH: 30.6 pg (ref 26.0–34.0)
MCHC: 33.4 g/dL (ref 30.0–36.0)
MCV: 91.6 fL (ref 80.0–100.0)
Monocytes Absolute: 0.9 10*3/uL (ref 0.1–1.0)
Monocytes Relative: 8 %
Neutro Abs: 7.7 10*3/uL (ref 1.7–7.7)
Neutrophils Relative %: 65 %
Platelets: 313 10*3/uL (ref 150–400)
RBC: 4.64 MIL/uL (ref 4.22–5.81)
RDW: 13.3 % (ref 11.5–15.5)
WBC: 11.8 10*3/uL — ABNORMAL HIGH (ref 4.0–10.5)
nRBC: 0 % (ref 0.0–0.2)

## 2019-03-20 LAB — COMPREHENSIVE METABOLIC PANEL
ALT: 79 U/L — ABNORMAL HIGH (ref 0–44)
AST: 57 U/L — ABNORMAL HIGH (ref 15–41)
Albumin: 4 g/dL (ref 3.5–5.0)
Alkaline Phosphatase: 106 U/L (ref 38–126)
Anion gap: 7 (ref 5–15)
BUN: 7 mg/dL (ref 6–20)
CO2: 25 mmol/L (ref 22–32)
Calcium: 9.2 mg/dL (ref 8.9–10.3)
Chloride: 101 mmol/L (ref 98–111)
Creatinine, Ser: 0.83 mg/dL (ref 0.61–1.24)
GFR calc Af Amer: >60 mL/min (ref >60)
GFR calc non Af Amer: >60 mL/min (ref >60)
Glucose, Bld: 94 mg/dL (ref 70–99)
Potassium: 4.3 mmol/L (ref 3.5–5.1)
Sodium: 133 mmol/L — ABNORMAL LOW (ref 135–145)
Total Bilirubin: 0.5 mg/dL (ref 0.3–1.2)
Total Protein: 8.4 g/dL — ABNORMAL HIGH (ref 6.5–8.1)

## 2019-03-20 LAB — URINALYSIS, MICROSCOPIC (REFLEX): WBC, UA: >50 WBC/hpf (ref 0–5)

## 2019-03-20 LAB — URINALYSIS, ROUTINE W REFLEX MICROSCOPIC
Bilirubin Urine: NEGATIVE
Glucose, UA: NEGATIVE mg/dL
Ketones, ur: NEGATIVE mg/dL
Nitrite: NEGATIVE
Protein, ur: NEGATIVE mg/dL
Specific Gravity, Urine: 1.01 (ref 1.005–1.030)
pH: 7 (ref 5.0–8.0)

## 2019-03-20 MED ORDER — CIPROFLOXACIN IN D5W 400 MG/200ML IV SOLN
400.0000 mg | Freq: Once | INTRAVENOUS | Status: AC
  Administered 2019-03-20: 400 mg via INTRAVENOUS
  Filled 2019-03-20: qty 200

## 2019-03-20 MED ORDER — NAPROXEN 250 MG PO TABS
500.0000 mg | ORAL_TABLET | Freq: Once | ORAL | Status: AC
  Administered 2019-03-20: 500 mg via ORAL
  Filled 2019-03-20: qty 2

## 2019-03-20 MED ORDER — CIPROFLOXACIN HCL 500 MG PO TABS: 500.0000 mg | ORAL_TABLET | Freq: Two times a day (BID) | ORAL | 0 refills | Status: AC

## 2019-03-20 MED ORDER — SODIUM CHLORIDE 0.9 % IV SOLN
INTRAVENOUS | Status: DC | PRN
  Administered 2019-03-20: 12:00:00 via INTRAVENOUS

## 2019-03-20 MED ORDER — ACETAMINOPHEN 500 MG PO TABS
1000.0000 mg | ORAL_TABLET | Freq: Once | ORAL | Status: AC
  Administered 2019-03-20: 1000 mg via ORAL
  Filled 2019-03-20: qty 2

## 2019-03-20 NOTE — Discharge Instructions (Addendum)
You were seen in the ED for persistent urinary symptoms and left flank pain.  Urinalysis continues to look infected.  Labs, kidney function, liver function and CT otherwise looked okay.  On exam you did not have pain in the prostate.  We will treat you for resistant UTI and possibly early kidney infection.  The antibiotic will also cover prostatitis.  Take ciprofloxacin for the next 14 days.  You can use naproxen every 12 hours and acetaminophen 500 to 1000 mg every 8 hours for pain control.  Return to the ED for fever greater than 100, vomiting, worsening abdominal or flank pain, difficulty with urination or unable to void, rectal pain.  Follow-up with urology in the next 2 weeks for persistent symptoms.  Go to the health department for management of your hepatitis C.  You should use a condom with every sexual encounter as Hep C can be transmitted via blood or bodily fluids to others

## 2019-03-20 NOTE — ED Notes (Signed)
Pt verbalized understanding of dc instructions.

## 2019-03-20 NOTE — ED Provider Notes (Signed)
[facility] EMERGENCY DEPARTMENT Provider Note   CSN: [id number] Arrival date & time: 03/20/19  4098    History   Chief Complaint Chief Complaint  Patient presents with  . Hematuria    HPI George Brady is a 53 y.o. male with history of remote opioid IV drug use, untreated hepatitis C diagnosed 2010 presents for persistent hematuria.  Associated with dysuria, frequency, left flank pain that is constant feeling like somebody punched him.  He is urinating blood clots.  He was seen in the ED on 4/24 and had positive nitrite UTI, he was discharged with Macrobid which she finished 2 days ago.  Initially felt like his symptoms were improving but 2 days ago they returned and now more severe with new left flank pain.  The pain is mild and he has not required any OTC medications for it.  Non-positional nonradiating.  He is in a long-term monogamous relationship with his wife and is "very" sexually active without condom use.  He is not concerned about STD.  His last prostate exam was 12 years ago and was told it was normal.  No changes to urine stream, urgency, leaking of urine. Of note, patient is very concerned about medical costs of today's visit.  He denies associated fever, nausea, vomiting, abdominal pain, penile discharge, testicular pain, rectal pain, perineal pain.  No anal receptive intercourse.  He has never had a UTI or kidney infection or renal stone in the past.  HPI  Past Medical History:  Diagnosis Date  . Hepatitis C   . Manic depressive disorder [facility])     Patient Active Problem List   Diagnosis Date Noted  . HEPATITIS C 07/01/2007  . ANXIETY 07/01/2007  . DEPRESSION 07/01/2007    Past Surgical History:  Procedure Laterality Date  . ABDOMINAL SURGERY          Home Medications    Prior to Admission medications   Medication Sig Start Date End Date Taking? Authorizing Provider  buPROPion (WELLBUTRIN) 100 MG tablet Take by mouth 2 (two) times daily.       [provider]  cariprazine (VRAYLAR) capsule Take by mouth.    [provider]  ciprofloxacin (CIPRO) 500 MG tablet Take 1 tablet (500 mg total) by mouth every 12 (twelve) hours for 14 days. 03/20/19 04/03/19  Liberty Handy, PA-C  cyclobenzaprine (FLEXERIL) 10 MG tablet Take 1 tablet (10 mg total) by mouth 2 (two) times daily as needed for muscle spasms. 09/23/14   Margarita Grizzle, MD  gabapentin (NEURONTIN) 400 MG capsule Take 400 mg by mouth 3 (three) times daily.    [provider]  HYDROcodone-acetaminophen (NORCO/VICODIN) 5-325 MG tablet Take 1-2 tablets by mouth every 6 (six) hours as needed. 01/24/19   Maxwell Caul, PA-C  lamoTRIgine (LAMICTAL) 100 MG tablet Take 100 mg by mouth daily.    [provider]  nitrofurantoin, macrocrystal-monohydrate, (MACROBID) 100 MG capsule Take 1 capsule (100 mg total) by mouth 2 (two) times daily. 03/12/19   Renne Crigler, PA-C  traZODone (DESYREL) 100 MG tablet Take 100 mg by mouth at bedtime.    [provider]    Family History No family history on file.  Social History Social History   Tobacco Use  . Smoking status: Current Every Day Smoker    Packs/day: 0.50  . Smokeless tobacco: Never Used  Substance Use Topics  . Alcohol use: Yes    Comment: occasional  . Drug use: Never  Allergies   Patient has no known allergies.   Review of Systems Review of Systems  Genitourinary: Positive for difficulty urinating, dysuria, flank pain and hematuria.  All other systems reviewed and are negative.    Physical Exam Updated Vital Signs BP 112/67 (BP Location: Right Arm)   Pulse 72   Temp 97.8 F (36.6 C) (Oral)   Resp 16   Ht 5\' 9"  (1.753 m)   Wt 68 kg   SpO2 99%   BMI 22.15 kg/m   Physical Exam Vitals signs and nursing note reviewed.  Constitutional:      Appearance: He is well-developed.     Comments: Non toxic.  HENT:     Head: Normocephalic and atraumatic.     Nose: Nose  normal.  Eyes:     Conjunctiva/sclera: Conjunctivae normal.  Neck:     Musculoskeletal: Normal range of motion.  Cardiovascular:     Rate and Rhythm: Normal rate and regular rhythm.     Heart sounds: Normal heart sounds.  Pulmonary:     Effort: Pulmonary effort is normal.     Breath sounds: Normal breath sounds.  Abdominal:     General: Bowel sounds are normal.     Palpations: Abdomen is soft.     Tenderness: There is abdominal tenderness. There is left CVA tenderness.     Comments: No G/R/R. No suprapubic tenderness. Negative Murphy's and McBurney's  Genitourinary:    Prostate: Normal.     Rectum: Normal.     Comments: Perianal skin normal.  No external hemorrhoids. Prostate is small without tenderness, prominence, fluctuance or obvious nodularity, mild discomfort with DRE.  Musculoskeletal: Normal range of motion.  Skin:    General: Skin is warm and dry.     Capillary Refill: Capillary refill takes less than 2 seconds.  Neurological:     Mental Status: He is alert.  Psychiatric:        Behavior: Behavior normal.      ED Treatments / Results  Labs (all labs ordered are listed, but only abnormal results are displayed) Labs Reviewed  URINALYSIS, ROUTINE W REFLEX MICROSCOPIC - Abnormal; Notable for the following components:      Result Value   Color, Urine STRAW (*)    APPearance CLOUDY (*)    Hgb urine dipstick LARGE (*)    Leukocytes,Ua LARGE (*)    All other components within normal limits  CBC WITH DIFFERENTIAL/PLATELET - Abnormal; Notable for the following components:   WBC 11.8 (*)    All other components within normal limits  COMPREHENSIVE METABOLIC PANEL - Abnormal; Notable for the following components:   Sodium 133 (*)    Total Protein 8.4 (*)    AST 57 (*)    ALT 79 (*)    All other components within normal limits  URINALYSIS, MICROSCOPIC (REFLEX) - Abnormal; Notable for the following components:   Bacteria, UA MANY (*)    All other components within  normal limits  URINE CULTURE  GC/CHLAMYDIA PROBE AMP ([facility]    EKG None  Radiology Ct Renal Stone Study  Result Date: 03/20/2019 CLINICAL DATA:  Left flank pain with gross hematuria. Refractory UTI symptoms. EXAM: CT ABDOMEN AND PELVIS WITHOUT CONTRAST TECHNIQUE: Multidetector CT imaging of the abdomen and pelvis was performed following the standard protocol without IV contrast. COMPARISON:  None. FINDINGS: Lower chest: Lung bases are clear. Hepatobiliary: Normal appearance of the liver. There appears to be a small decompressed gallbladder. No significant biliary  dilatation. Pancreas: Limited evaluation without gross abnormality. Spleen: Normal in size without focal abnormality. Adrenals/Urinary Tract: Normal adrenal glands. Normal appearance of both kidneys without stones or hydronephrosis. Moderate distention of the urinary bladder. Stomach/Bowel: The lack of oral contrast and minimal intra-abdominal fat limits evaluation of the bowel structures. There is no evidence to suggest bowel obstruction or inflammation. An appendix is not visualized. Vascular/Lymphatic: Atherosclerotic calcifications in the aorta and iliac arteries without aneurysm. No abdominopelvic lymphadenopathy. Reproductive: Prostate is unremarkable. Other: No ascites.  Negative for free air. Musculoskeletal: Old fracture of the right twelfth rib. Small foci of lucency in the posterior right ilium is probably an incidental finding. IMPRESSION: 1. Negative for kidney stones or hydronephrosis. 2. No acute abnormality in the abdomen or pelvis. 3. Moderate distention of the urinary bladder. Electronically Signed   By: Richarda Overlie M.D.   On: 03/20/2019 11:20    Procedures Procedures (including critical care time)  Medications Ordered in ED Medications  ciprofloxacin (CIPRO) IVPB 400 mg (400 mg Intravenous New Bag/Given 03/20/19 1144)  0.9 %  sodium chloride infusion ( Intravenous New Bag/Given 03/20/19 1142)   acetaminophen (TYLENOL) tablet 1,000 mg (1,000 mg Oral Given 03/20/19 1135)  naproxen (NAPROSYN) tablet 500 mg (500 mg Oral Given 03/20/19 1135)     Initial Impression / Assessment and Plan / ED Course  I have reviewed the triage vital signs and the nursing notes.  Pertinent labs & imaging results that were available during my care of the patient were reviewed by me and considered in my medical decision making (see chart for details).  Clinical Course as of Mar 20 1215  Sat Mar 20, 2019  1043 Bacteria, UA(!): MANY [CG]  1043 WBC, UA: >50 [CG]  1043 Leukocytes,Ua(!): LARGE [CG]  1043 Hgb urine dipstick(!): LARGE [CG]  1126 IMPRESSION: 1. Negative for kidney stones or hydronephrosis. 2. No acute abnormality in the abdomen or pelvis. 3. Moderate distention of the urinary bladder.  CT Renal Stone Study [CG]  1210 RBC / HPF: 6-10 [CG]  1211 WBC(!): 11.8 [CG]  1211 AST(!): 57 [CG]  1211 ALT(!): 79 [CG]  1211 Creatinine: 0.83 [CG]    Clinical Course User Index [CG] Liberty Handy, PA-C      Urine culture on 4/24 grew >100 K E.coli, sensitive to macrobid.  New onset L CVAT and persistent UTI now ddx includes pyelonephritis vs infected renal stone.  Less likely STD, prostatitis given symptoms. Pt ademanatly declined urethral swab for STD, will add gc/chlamydia to urine sample. Labs, CT renal pending. Consider rectal exam to evaluate prostate if work up is not revealing.     1210: Work up remarkable for UA concerning for ongoing infection despite abx.  Minimal leukocytosis.  Minimal LFT elevation in setting of hep C untreated.  CT shows distended bladder. Clinically patient has no suprapubic tenderness, distention. DRE unremarkable making acute prostatitis unlikely.  Will give NSAIDs and ciprofloxain in ED to cover early pyelonephritis and prostatitis.   Final Clinical Impressions(s) / ED Diagnoses   Dc with ciprofloxacin, naproxen, tylenol to cover early pyelonephritis and less likely  chronic prostatitis.  No signs of urinary retention, SIRS, renal stone.  STD testing pending.  Encouraged f/u with urology in 1 week if symptoms persist.  He has asymptomatic hep C having unprotected sexual intercourse with wife.  Discussed risks of this. Limited income, encouraged f/u with health department for treatment of hep C. Return precautions given.  Final diagnoses:  Recurrent urinary tract infection  E.  coli UTI  Left flank pain  Chronic hepatitis C without hepatic coma [facility])    ED Discharge Orders         Ordered    ciprofloxacin (CIPRO) 500 MG tablet  Every 12 hours     03/20/19 1216           Jerrell Mylar 03/20/19 1216    Terrilee Files, MD 03/20/19 1843

## 2019-03-20 NOTE — ED Notes (Signed)
ED Provider at bedside. 

## 2019-03-20 NOTE — ED Triage Notes (Addendum)
Hematuria this am, eval x 1 week ago for flank pain. States has been taking the antibiotics prescribed

## 2019-03-22 LAB — GC/CHLAMYDIA PROBE AMP (~~LOC~~) NOT AT ARMC
Chlamydia: NEGATIVE
Neisseria Gonorrhea: NEGATIVE

## 2019-03-22 LAB — URINE CULTURE: Culture: >=100000 — AB

## 2019-03-23 ENCOUNTER — Telehealth: Payer: Self-pay

## 2019-03-23 DIAGNOSIS — R079 Chest pain, unspecified: Secondary | ICD-10-CM | POA: Diagnosis not present

## 2019-03-23 NOTE — Telephone Encounter (Signed)
Post ED Visit - Positive Culture Follow-up  Culture report reviewed by antimicrobial stewardship pharmacist: [facility] []  Enzo Bi, Pharm.D. []  Celedonio Miyamoto, Pharm.D., BCPS AQ-ID []  Garvin Fila, Pharm.D., BCPS []  Georgina Pillion, Pharm.D., BCPS []  [place], [address].D., BCPS, AAHIVP []  Estella Husk, Pharm.D., BCPS, AAHIVP []  Lysle Pearl, PharmD, BCPS []  Phillips Climes, PharmD, BCPS []  Agapito Games, PharmD, BCPS []  Verlan Friends, PharmD []  Mervyn Gay, PharmD, BCPS []  Vinnie Level, PharmD [facility] []  Len Childs, PharmD []  Greer Pickerel, PharmD []  Adalberto Cole, PharmD []  Perlie Gold, Rph []  Lonell Face) Jean Rosenthal, PharmD []  Earl Many, PharmD []  Junita Push, PharmD []  Dorna Leitz, PharmD []  Terrilee Files, PharmD []  Lynann Beaver, PharmD []  Keturah Barre, PharmD []  Loralee Pacas, PharmD []  Bernadene Person, PharmD   Positive urine culture Treated with Cipro, organism sensitive to the same and no further patient follow-up is required at this time.  Jerry Caras 03/23/2019, 8:43 AM

## 2019-03-29 DIAGNOSIS — H903 Sensorineural hearing loss, bilateral: Secondary | ICD-10-CM | POA: Diagnosis not present

## 2019-04-02 DIAGNOSIS — R279 Unspecified lack of coordination: Secondary | ICD-10-CM | POA: Diagnosis not present

## 2019-04-02 DIAGNOSIS — R109 Unspecified abdominal pain: Secondary | ICD-10-CM | POA: Diagnosis not present

## 2019-04-02 DIAGNOSIS — Z743 Need for continuous supervision: Secondary | ICD-10-CM | POA: Diagnosis not present

## 2019-04-02 DIAGNOSIS — R5381 Other malaise: Secondary | ICD-10-CM | POA: Diagnosis not present

## 2019-04-07 DIAGNOSIS — Z471 Aftercare following joint replacement surgery: Secondary | ICD-10-CM | POA: Diagnosis not present

## 2019-04-07 DIAGNOSIS — Z96642 Presence of left artificial hip joint: Secondary | ICD-10-CM | POA: Diagnosis not present

## 2019-04-07 DIAGNOSIS — J45909 Unspecified asthma, uncomplicated: Secondary | ICD-10-CM | POA: Diagnosis not present

## 2019-04-08 DIAGNOSIS — J449 Chronic obstructive pulmonary disease, unspecified: Secondary | ICD-10-CM | POA: Diagnosis not present

## 2019-04-09 DIAGNOSIS — L0390 Cellulitis, unspecified: Secondary | ICD-10-CM | POA: Diagnosis not present

## 2019-04-09 DIAGNOSIS — I110 Hypertensive heart disease with heart failure: Secondary | ICD-10-CM | POA: Diagnosis not present

## 2019-04-09 DIAGNOSIS — E785 Hyperlipidemia, unspecified: Secondary | ICD-10-CM | POA: Diagnosis not present

## 2019-04-09 DIAGNOSIS — J3081 Allergic rhinitis due to animal (cat) (dog) hair and dander: Secondary | ICD-10-CM | POA: Diagnosis not present

## 2019-04-09 DIAGNOSIS — E11628 Type 2 diabetes mellitus with other skin complications: Secondary | ICD-10-CM | POA: Diagnosis not present

## 2019-04-09 DIAGNOSIS — M549 Dorsalgia, unspecified: Secondary | ICD-10-CM | POA: Diagnosis not present

## 2019-04-09 DIAGNOSIS — I509 Heart failure, unspecified: Secondary | ICD-10-CM | POA: Diagnosis not present

## 2019-04-09 DIAGNOSIS — J3089 Other allergic rhinitis: Secondary | ICD-10-CM | POA: Diagnosis not present

## 2019-04-09 DIAGNOSIS — I2510 Atherosclerotic heart disease of native coronary artery without angina pectoris: Secondary | ICD-10-CM | POA: Diagnosis not present

## 2019-04-09 DIAGNOSIS — J301 Allergic rhinitis due to pollen: Secondary | ICD-10-CM | POA: Diagnosis not present

## 2019-04-13 DIAGNOSIS — R972 Elevated prostate specific antigen [PSA]: Secondary | ICD-10-CM | POA: Diagnosis not present

## 2019-04-14 DIAGNOSIS — F209 Schizophrenia, unspecified: Secondary | ICD-10-CM | POA: Diagnosis not present

## 2019-04-16 DIAGNOSIS — Z7984 Long term (current) use of oral hypoglycemic drugs: Secondary | ICD-10-CM | POA: Diagnosis not present

## 2019-04-16 DIAGNOSIS — E1165 Type 2 diabetes mellitus with hyperglycemia: Secondary | ICD-10-CM | POA: Diagnosis not present

## 2019-04-16 DIAGNOSIS — F419 Anxiety disorder, unspecified: Secondary | ICD-10-CM | POA: Diagnosis not present

## 2019-04-16 DIAGNOSIS — I2510 Atherosclerotic heart disease of native coronary artery without angina pectoris: Secondary | ICD-10-CM | POA: Diagnosis not present

## 2019-04-16 DIAGNOSIS — K589 Irritable bowel syndrome without diarrhea: Secondary | ICD-10-CM | POA: Diagnosis not present

## 2019-04-16 DIAGNOSIS — M5412 Radiculopathy, cervical region: Secondary | ICD-10-CM | POA: Diagnosis not present

## 2019-04-16 DIAGNOSIS — F329 Major depressive disorder, single episode, unspecified: Secondary | ICD-10-CM | POA: Diagnosis not present

## 2019-04-16 DIAGNOSIS — I10 Essential (primary) hypertension: Secondary | ICD-10-CM | POA: Diagnosis not present

## 2019-04-16 DIAGNOSIS — Z20828 Contact with and (suspected) exposure to other viral communicable diseases: Secondary | ICD-10-CM | POA: Diagnosis not present

## 2019-04-16 DIAGNOSIS — E785 Hyperlipidemia, unspecified: Secondary | ICD-10-CM | POA: Diagnosis not present

## 2019-04-19 DIAGNOSIS — I10 Essential (primary) hypertension: Secondary | ICD-10-CM | POA: Diagnosis not present

## 2019-04-19 DIAGNOSIS — E1121 Type 2 diabetes mellitus with diabetic nephropathy: Secondary | ICD-10-CM | POA: Diagnosis not present

## 2019-04-19 DIAGNOSIS — E782 Mixed hyperlipidemia: Secondary | ICD-10-CM | POA: Diagnosis not present

## 2019-04-19 DIAGNOSIS — E1140 Type 2 diabetes mellitus with diabetic neuropathy, unspecified: Secondary | ICD-10-CM | POA: Diagnosis not present

## 2019-04-19 DIAGNOSIS — B351 Tinea unguium: Secondary | ICD-10-CM | POA: Diagnosis not present

## 2019-04-19 DIAGNOSIS — M79674 Pain in right toe(s): Secondary | ICD-10-CM | POA: Diagnosis not present

## 2019-04-19 DIAGNOSIS — M79675 Pain in left toe(s): Secondary | ICD-10-CM | POA: Diagnosis not present

## 2019-04-19 DIAGNOSIS — L851 Acquired keratosis [keratoderma] palmaris et plantaris: Secondary | ICD-10-CM | POA: Diagnosis not present

## 2019-04-19 DIAGNOSIS — I739 Peripheral vascular disease, unspecified: Secondary | ICD-10-CM | POA: Diagnosis not present

## 2019-04-19 DIAGNOSIS — Z20828 Contact with and (suspected) exposure to other viral communicable diseases: Secondary | ICD-10-CM | POA: Diagnosis not present

## 2019-04-19 DIAGNOSIS — E1165 Type 2 diabetes mellitus with hyperglycemia: Secondary | ICD-10-CM | POA: Diagnosis not present

## 2019-04-20 DIAGNOSIS — E669 Obesity, unspecified: Secondary | ICD-10-CM | POA: Diagnosis not present

## 2019-04-20 DIAGNOSIS — I130 Hypertensive heart and chronic kidney disease with heart failure and stage 1 through stage 4 chronic kidney disease, or unspecified chronic kidney disease: Secondary | ICD-10-CM | POA: Diagnosis not present

## 2019-04-20 DIAGNOSIS — Z87891 Personal history of nicotine dependence: Secondary | ICD-10-CM | POA: Diagnosis not present

## 2019-04-20 DIAGNOSIS — H43811 Vitreous degeneration, right eye: Secondary | ICD-10-CM | POA: Diagnosis not present

## 2019-04-20 DIAGNOSIS — E1151 Type 2 diabetes mellitus with diabetic peripheral angiopathy without gangrene: Secondary | ICD-10-CM | POA: Diagnosis not present

## 2019-04-20 DIAGNOSIS — K219 Gastro-esophageal reflux disease without esophagitis: Secondary | ICD-10-CM | POA: Diagnosis not present

## 2019-04-20 DIAGNOSIS — I5032 Chronic diastolic (congestive) heart failure: Secondary | ICD-10-CM | POA: Diagnosis not present

## 2019-04-20 DIAGNOSIS — F419 Anxiety disorder, unspecified: Secondary | ICD-10-CM | POA: Diagnosis not present

## 2019-04-20 DIAGNOSIS — E1122 Type 2 diabetes mellitus with diabetic chronic kidney disease: Secondary | ICD-10-CM | POA: Diagnosis not present

## 2019-04-20 DIAGNOSIS — N183 Chronic kidney disease, stage 3 (moderate): Secondary | ICD-10-CM | POA: Diagnosis not present

## 2019-04-20 DIAGNOSIS — I2510 Atherosclerotic heart disease of native coronary artery without angina pectoris: Secondary | ICD-10-CM | POA: Diagnosis not present

## 2019-04-20 DIAGNOSIS — F329 Major depressive disorder, single episode, unspecified: Secondary | ICD-10-CM | POA: Diagnosis not present

## 2019-04-20 DIAGNOSIS — Z9981 Dependence on supplemental oxygen: Secondary | ICD-10-CM | POA: Diagnosis not present

## 2019-04-20 DIAGNOSIS — Z6841 Body Mass Index (BMI) 40.0 and over, adult: Secondary | ICD-10-CM | POA: Diagnosis not present

## 2019-04-20 DIAGNOSIS — Z85038 Personal history of other malignant neoplasm of large intestine: Secondary | ICD-10-CM | POA: Diagnosis not present

## 2019-04-20 DIAGNOSIS — D631 Anemia in chronic kidney disease: Secondary | ICD-10-CM | POA: Diagnosis not present

## 2019-04-20 DIAGNOSIS — Z9049 Acquired absence of other specified parts of digestive tract: Secondary | ICD-10-CM | POA: Diagnosis not present

## 2019-04-20 DIAGNOSIS — Z466 Encounter for fitting and adjustment of urinary device: Secondary | ICD-10-CM | POA: Diagnosis not present

## 2019-04-20 DIAGNOSIS — Z96653 Presence of artificial knee joint, bilateral: Secondary | ICD-10-CM | POA: Diagnosis not present

## 2019-04-20 DIAGNOSIS — M810 Age-related osteoporosis without current pathological fracture: Secondary | ICD-10-CM | POA: Diagnosis not present

## 2019-04-20 DIAGNOSIS — J449 Chronic obstructive pulmonary disease, unspecified: Secondary | ICD-10-CM | POA: Diagnosis not present

## 2019-04-21 DIAGNOSIS — F3181 Bipolar II disorder: Secondary | ICD-10-CM | POA: Diagnosis not present

## 2019-04-22 DIAGNOSIS — Z952 Presence of prosthetic heart valve: Secondary | ICD-10-CM | POA: Diagnosis not present

## 2019-04-22 DIAGNOSIS — L97521 Non-pressure chronic ulcer of other part of left foot limited to breakdown of skin: Secondary | ICD-10-CM | POA: Diagnosis not present

## 2019-04-22 DIAGNOSIS — I872 Venous insufficiency (chronic) (peripheral): Secondary | ICD-10-CM | POA: Diagnosis not present

## 2019-04-22 DIAGNOSIS — H6123 Impacted cerumen, bilateral: Secondary | ICD-10-CM | POA: Diagnosis not present

## 2019-04-22 DIAGNOSIS — E039 Hypothyroidism, unspecified: Secondary | ICD-10-CM | POA: Diagnosis not present

## 2019-04-22 DIAGNOSIS — E785 Hyperlipidemia, unspecified: Secondary | ICD-10-CM | POA: Diagnosis not present

## 2019-04-22 DIAGNOSIS — Z7901 Long term (current) use of anticoagulants: Secondary | ICD-10-CM | POA: Diagnosis not present

## 2019-04-22 DIAGNOSIS — Z87891 Personal history of nicotine dependence: Secondary | ICD-10-CM | POA: Diagnosis not present

## 2019-04-22 DIAGNOSIS — L03116 Cellulitis of left lower limb: Secondary | ICD-10-CM | POA: Diagnosis not present

## 2019-04-23 DIAGNOSIS — E0590 Thyrotoxicosis, unspecified without thyrotoxic crisis or storm: Secondary | ICD-10-CM | POA: Diagnosis not present

## 2019-04-27 DIAGNOSIS — Z20828 Contact with and (suspected) exposure to other viral communicable diseases: Secondary | ICD-10-CM | POA: Diagnosis not present

## 2019-04-29 DIAGNOSIS — N184 Chronic kidney disease, stage 4 (severe): Secondary | ICD-10-CM | POA: Diagnosis not present

## 2019-04-29 DIAGNOSIS — Z789 Other specified health status: Secondary | ICD-10-CM | POA: Diagnosis not present

## 2019-04-29 DIAGNOSIS — I5032 Chronic diastolic (congestive) heart failure: Secondary | ICD-10-CM | POA: Diagnosis not present

## 2019-04-29 DIAGNOSIS — I4891 Unspecified atrial fibrillation: Secondary | ICD-10-CM | POA: Diagnosis not present

## 2019-04-30 DIAGNOSIS — Z20828 Contact with and (suspected) exposure to other viral communicable diseases: Secondary | ICD-10-CM | POA: Diagnosis not present

## 2019-05-04 DIAGNOSIS — K8689 Other specified diseases of pancreas: Secondary | ICD-10-CM | POA: Diagnosis not present

## 2019-05-04 DIAGNOSIS — K760 Fatty (change of) liver, not elsewhere classified: Secondary | ICD-10-CM | POA: Diagnosis not present

## 2019-05-04 DIAGNOSIS — K4020 Bilateral inguinal hernia, without obstruction or gangrene, not specified as recurrent: Secondary | ICD-10-CM | POA: Diagnosis not present

## 2019-05-04 DIAGNOSIS — N400 Enlarged prostate without lower urinary tract symptoms: Secondary | ICD-10-CM | POA: Diagnosis not present

## 2019-05-04 DIAGNOSIS — K5730 Diverticulosis of large intestine without perforation or abscess without bleeding: Secondary | ICD-10-CM | POA: Diagnosis not present

## 2019-05-06 DIAGNOSIS — J449 Chronic obstructive pulmonary disease, unspecified: Secondary | ICD-10-CM | POA: Diagnosis not present

## 2019-05-07 DIAGNOSIS — R0989 Other specified symptoms and signs involving the circulatory and respiratory systems: Secondary | ICD-10-CM | POA: Diagnosis not present

## 2019-05-07 DIAGNOSIS — I517 Cardiomegaly: Secondary | ICD-10-CM | POA: Diagnosis not present

## 2019-05-08 DIAGNOSIS — G894 Chronic pain syndrome: Secondary | ICD-10-CM | POA: Diagnosis not present

## 2019-05-08 DIAGNOSIS — R0602 Shortness of breath: Secondary | ICD-10-CM | POA: Diagnosis not present

## 2019-05-08 DIAGNOSIS — Z96652 Presence of left artificial knee joint: Secondary | ICD-10-CM | POA: Diagnosis not present

## 2019-05-08 DIAGNOSIS — M1712 Unilateral primary osteoarthritis, left knee: Secondary | ICD-10-CM | POA: Diagnosis not present

## 2019-05-08 DIAGNOSIS — R531 Weakness: Secondary | ICD-10-CM | POA: Diagnosis not present

## 2019-05-08 DIAGNOSIS — C50919 Malignant neoplasm of unspecified site of unspecified female breast: Secondary | ICD-10-CM | POA: Diagnosis not present

## 2019-05-10 DIAGNOSIS — I495 Sick sinus syndrome: Secondary | ICD-10-CM | POA: Diagnosis not present

## 2019-05-10 DIAGNOSIS — I2510 Atherosclerotic heart disease of native coronary artery without angina pectoris: Secondary | ICD-10-CM | POA: Diagnosis not present

## 2019-05-10 DIAGNOSIS — I255 Ischemic cardiomyopathy: Secondary | ICD-10-CM | POA: Diagnosis not present

## 2019-05-10 DIAGNOSIS — I110 Hypertensive heart disease with heart failure: Secondary | ICD-10-CM | POA: Diagnosis not present

## 2019-05-10 DIAGNOSIS — I472 Ventricular tachycardia: Secondary | ICD-10-CM | POA: Diagnosis not present

## 2019-05-10 DIAGNOSIS — D649 Anemia, unspecified: Secondary | ICD-10-CM | POA: Diagnosis not present

## 2019-05-10 DIAGNOSIS — I480 Paroxysmal atrial fibrillation: Secondary | ICD-10-CM | POA: Diagnosis not present

## 2019-05-10 DIAGNOSIS — I10 Essential (primary) hypertension: Secondary | ICD-10-CM | POA: Diagnosis not present

## 2019-05-10 DIAGNOSIS — Z7901 Long term (current) use of anticoagulants: Secondary | ICD-10-CM | POA: Diagnosis not present

## 2019-05-10 DIAGNOSIS — E785 Hyperlipidemia, unspecified: Secondary | ICD-10-CM | POA: Diagnosis not present

## 2019-05-10 DIAGNOSIS — I5023 Acute on chronic systolic (congestive) heart failure: Secondary | ICD-10-CM | POA: Diagnosis not present

## 2019-05-10 DIAGNOSIS — E119 Type 2 diabetes mellitus without complications: Secondary | ICD-10-CM | POA: Diagnosis not present

## 2019-05-10 DIAGNOSIS — I253 Aneurysm of heart: Secondary | ICD-10-CM | POA: Diagnosis not present

## 2019-05-10 DIAGNOSIS — Z79899 Other long term (current) drug therapy: Secondary | ICD-10-CM | POA: Diagnosis not present

## 2019-05-10 DIAGNOSIS — F17210 Nicotine dependence, cigarettes, uncomplicated: Secondary | ICD-10-CM | POA: Diagnosis not present

## 2019-05-10 DIAGNOSIS — R296 Repeated falls: Secondary | ICD-10-CM | POA: Diagnosis not present

## 2019-05-11 DIAGNOSIS — M1990 Unspecified osteoarthritis, unspecified site: Secondary | ICD-10-CM | POA: Diagnosis not present

## 2019-05-11 DIAGNOSIS — Z7409 Other reduced mobility: Secondary | ICD-10-CM | POA: Diagnosis not present

## 2019-05-13 DIAGNOSIS — J449 Chronic obstructive pulmonary disease, unspecified: Secondary | ICD-10-CM | POA: Diagnosis not present

## 2019-05-17 DIAGNOSIS — H02403 Unspecified ptosis of bilateral eyelids: Secondary | ICD-10-CM | POA: Diagnosis not present

## 2019-05-17 DIAGNOSIS — H02831 Dermatochalasis of right upper eyelid: Secondary | ICD-10-CM | POA: Diagnosis not present

## 2019-05-17 DIAGNOSIS — H02834 Dermatochalasis of left upper eyelid: Secondary | ICD-10-CM | POA: Diagnosis not present

## 2019-05-18 DIAGNOSIS — S12100A Unspecified displaced fracture of second cervical vertebra, initial encounter for closed fracture: Secondary | ICD-10-CM | POA: Diagnosis not present

## 2019-05-18 DIAGNOSIS — I10 Essential (primary) hypertension: Secondary | ICD-10-CM | POA: Diagnosis not present

## 2019-05-18 DIAGNOSIS — D7589 Other specified diseases of blood and blood-forming organs: Secondary | ICD-10-CM | POA: Diagnosis not present

## 2019-05-18 DIAGNOSIS — K635 Polyp of colon: Secondary | ICD-10-CM | POA: Diagnosis not present

## 2019-05-18 DIAGNOSIS — F0391 Unspecified dementia with behavioral disturbance: Secondary | ICD-10-CM | POA: Diagnosis not present

## 2019-05-18 DIAGNOSIS — I4891 Unspecified atrial fibrillation: Secondary | ICD-10-CM | POA: Diagnosis not present

## 2019-05-19 DIAGNOSIS — R262 Difficulty in walking, not elsewhere classified: Secondary | ICD-10-CM | POA: Diagnosis not present

## 2019-05-19 DIAGNOSIS — I4891 Unspecified atrial fibrillation: Secondary | ICD-10-CM | POA: Diagnosis not present

## 2019-05-19 DIAGNOSIS — F329 Major depressive disorder, single episode, unspecified: Secondary | ICD-10-CM | POA: Diagnosis not present

## 2019-05-19 DIAGNOSIS — F0391 Unspecified dementia with behavioral disturbance: Secondary | ICD-10-CM | POA: Diagnosis not present

## 2019-05-19 DIAGNOSIS — K589 Irritable bowel syndrome without diarrhea: Secondary | ICD-10-CM | POA: Diagnosis not present

## 2019-05-19 DIAGNOSIS — R633 Feeding difficulties: Secondary | ICD-10-CM | POA: Diagnosis not present

## 2019-05-19 DIAGNOSIS — I890 Lymphedema, not elsewhere classified: Secondary | ICD-10-CM | POA: Diagnosis not present

## 2019-05-19 DIAGNOSIS — N3281 Overactive bladder: Secondary | ICD-10-CM | POA: Diagnosis not present

## 2019-05-19 DIAGNOSIS — R0609 Other forms of dyspnea: Secondary | ICD-10-CM | POA: Diagnosis not present

## 2019-05-19 DIAGNOSIS — M1990 Unspecified osteoarthritis, unspecified site: Secondary | ICD-10-CM | POA: Diagnosis not present

## 2019-05-19 DIAGNOSIS — R278 Other lack of coordination: Secondary | ICD-10-CM | POA: Diagnosis not present

## 2019-05-19 DIAGNOSIS — E119 Type 2 diabetes mellitus without complications: Secondary | ICD-10-CM | POA: Diagnosis not present

## 2019-05-19 DIAGNOSIS — Z794 Long term (current) use of insulin: Secondary | ICD-10-CM | POA: Diagnosis not present

## 2019-05-19 DIAGNOSIS — R260 Ataxic gait: Secondary | ICD-10-CM | POA: Diagnosis not present

## 2019-05-19 DIAGNOSIS — G8929 Other chronic pain: Secondary | ICD-10-CM | POA: Diagnosis not present

## 2019-05-19 DIAGNOSIS — R531 Weakness: Secondary | ICD-10-CM | POA: Diagnosis not present

## 2019-05-19 DIAGNOSIS — C7931 Secondary malignant neoplasm of brain: Secondary | ICD-10-CM | POA: Diagnosis not present

## 2019-05-19 DIAGNOSIS — M4322 Fusion of spine, cervical region: Secondary | ICD-10-CM | POA: Diagnosis not present

## 2019-05-19 DIAGNOSIS — N2889 Other specified disorders of kidney and ureter: Secondary | ICD-10-CM | POA: Diagnosis not present

## 2019-05-19 DIAGNOSIS — G992 Myelopathy in diseases classified elsewhere: Secondary | ICD-10-CM | POA: Diagnosis not present

## 2019-05-19 DIAGNOSIS — M4802 Spinal stenosis, cervical region: Secondary | ICD-10-CM | POA: Diagnosis not present

## 2019-05-19 DIAGNOSIS — S12100A Unspecified displaced fracture of second cervical vertebra, initial encounter for closed fracture: Secondary | ICD-10-CM | POA: Diagnosis not present

## 2019-05-19 DIAGNOSIS — I10 Essential (primary) hypertension: Secondary | ICD-10-CM | POA: Diagnosis not present

## 2019-05-19 DIAGNOSIS — K219 Gastro-esophageal reflux disease without esophagitis: Secondary | ICD-10-CM | POA: Diagnosis not present

## 2019-05-20 DIAGNOSIS — D122 Benign neoplasm of ascending colon: Secondary | ICD-10-CM | POA: Diagnosis not present

## 2019-05-20 DIAGNOSIS — D61818 Other pancytopenia: Secondary | ICD-10-CM | POA: Diagnosis not present

## 2019-05-20 DIAGNOSIS — D123 Benign neoplasm of transverse colon: Secondary | ICD-10-CM | POA: Diagnosis not present

## 2019-05-20 DIAGNOSIS — R7303 Prediabetes: Secondary | ICD-10-CM | POA: Diagnosis not present

## 2019-05-20 DIAGNOSIS — D127 Benign neoplasm of rectosigmoid junction: Secondary | ICD-10-CM | POA: Diagnosis not present

## 2019-05-20 DIAGNOSIS — K8080 Other cholelithiasis without obstruction: Secondary | ICD-10-CM | POA: Diagnosis not present

## 2019-05-20 DIAGNOSIS — N186 End stage renal disease: Secondary | ICD-10-CM | POA: Diagnosis not present

## 2019-05-20 DIAGNOSIS — D0371 Melanoma in situ of right lower limb, including hip: Secondary | ICD-10-CM | POA: Diagnosis not present

## 2019-05-20 DIAGNOSIS — E038 Other specified hypothyroidism: Secondary | ICD-10-CM | POA: Diagnosis not present

## 2019-05-20 DIAGNOSIS — D124 Benign neoplasm of descending colon: Secondary | ICD-10-CM | POA: Diagnosis not present

## 2019-05-20 DIAGNOSIS — L905 Scar conditions and fibrosis of skin: Secondary | ICD-10-CM | POA: Diagnosis not present

## 2019-05-20 DIAGNOSIS — I10 Essential (primary) hypertension: Secondary | ICD-10-CM | POA: Diagnosis not present

## 2019-05-20 DIAGNOSIS — N2581 Secondary hyperparathyroidism of renal origin: Secondary | ICD-10-CM | POA: Diagnosis not present

## 2019-05-21 DIAGNOSIS — Z20828 Contact with and (suspected) exposure to other viral communicable diseases: Secondary | ICD-10-CM | POA: Diagnosis not present

## 2019-05-24 DIAGNOSIS — M546 Pain in thoracic spine: Secondary | ICD-10-CM | POA: Diagnosis not present

## 2019-05-24 DIAGNOSIS — C4442 Squamous cell carcinoma of skin of scalp and neck: Secondary | ICD-10-CM | POA: Diagnosis not present

## 2019-05-24 DIAGNOSIS — L570 Actinic keratosis: Secondary | ICD-10-CM | POA: Diagnosis not present

## 2019-05-24 DIAGNOSIS — D044 Carcinoma in situ of skin of scalp and neck: Secondary | ICD-10-CM | POA: Diagnosis not present

## 2019-05-25 DIAGNOSIS — J449 Chronic obstructive pulmonary disease, unspecified: Secondary | ICD-10-CM | POA: Diagnosis not present

## 2019-05-25 DIAGNOSIS — C44529 Squamous cell carcinoma of skin of other part of trunk: Secondary | ICD-10-CM | POA: Diagnosis not present

## 2019-05-25 DIAGNOSIS — C44519 Basal cell carcinoma of skin of other part of trunk: Secondary | ICD-10-CM | POA: Diagnosis not present

## 2019-05-25 DIAGNOSIS — C44319 Basal cell carcinoma of skin of other parts of face: Secondary | ICD-10-CM | POA: Diagnosis not present

## 2019-05-26 DIAGNOSIS — G4700 Insomnia, unspecified: Secondary | ICD-10-CM | POA: Diagnosis not present

## 2019-05-26 DIAGNOSIS — M79675 Pain in left toe(s): Secondary | ICD-10-CM | POA: Diagnosis not present

## 2019-05-26 DIAGNOSIS — B078 Other viral warts: Secondary | ICD-10-CM | POA: Diagnosis not present

## 2019-05-26 DIAGNOSIS — D128 Benign neoplasm of rectum: Secondary | ICD-10-CM | POA: Diagnosis not present

## 2019-05-26 DIAGNOSIS — D0439 Carcinoma in situ of skin of other parts of face: Secondary | ICD-10-CM | POA: Diagnosis not present

## 2019-05-26 DIAGNOSIS — L700 Acne vulgaris: Secondary | ICD-10-CM | POA: Diagnosis not present

## 2019-05-26 DIAGNOSIS — M79674 Pain in right toe(s): Secondary | ICD-10-CM | POA: Diagnosis not present

## 2019-05-26 DIAGNOSIS — J449 Chronic obstructive pulmonary disease, unspecified: Secondary | ICD-10-CM | POA: Diagnosis not present

## 2019-05-26 DIAGNOSIS — B351 Tinea unguium: Secondary | ICD-10-CM | POA: Diagnosis not present

## 2019-05-26 DIAGNOSIS — L919 Hypertrophic disorder of the skin, unspecified: Secondary | ICD-10-CM | POA: Diagnosis not present

## 2019-05-27 DIAGNOSIS — D2210 Melanocytic nevi of unspecified eyelid, including canthus: Secondary | ICD-10-CM | POA: Diagnosis not present

## 2019-05-27 DIAGNOSIS — L720 Epidermal cyst: Secondary | ICD-10-CM | POA: Diagnosis not present

## 2019-05-28 DIAGNOSIS — N891 Moderate vaginal dysplasia: Secondary | ICD-10-CM | POA: Diagnosis not present

## 2019-05-30 DIAGNOSIS — I69322 Dysarthria following cerebral infarction: Secondary | ICD-10-CM | POA: Diagnosis not present

## 2019-05-30 DIAGNOSIS — Z7901 Long term (current) use of anticoagulants: Secondary | ICD-10-CM | POA: Diagnosis not present

## 2019-05-30 DIAGNOSIS — I69311 Memory deficit following cerebral infarction: Secondary | ICD-10-CM | POA: Diagnosis not present

## 2019-05-30 DIAGNOSIS — I10 Essential (primary) hypertension: Secondary | ICD-10-CM | POA: Diagnosis not present

## 2019-05-30 DIAGNOSIS — Z945 Skin transplant status: Secondary | ICD-10-CM | POA: Diagnosis not present

## 2019-05-30 DIAGNOSIS — M62838 Other muscle spasm: Secondary | ICD-10-CM | POA: Diagnosis not present

## 2019-05-30 DIAGNOSIS — I69351 Hemiplegia and hemiparesis following cerebral infarction affecting right dominant side: Secondary | ICD-10-CM | POA: Diagnosis not present

## 2019-05-30 DIAGNOSIS — G40909 Epilepsy, unspecified, not intractable, without status epilepticus: Secondary | ICD-10-CM | POA: Diagnosis not present

## 2019-05-30 DIAGNOSIS — F17210 Nicotine dependence, cigarettes, uncomplicated: Secondary | ICD-10-CM | POA: Diagnosis not present

## 2019-05-30 DIAGNOSIS — E119 Type 2 diabetes mellitus without complications: Secondary | ICD-10-CM | POA: Diagnosis not present

## 2019-05-30 DIAGNOSIS — E785 Hyperlipidemia, unspecified: Secondary | ICD-10-CM | POA: Diagnosis not present

## 2019-05-30 DIAGNOSIS — I69320 Aphasia following cerebral infarction: Secondary | ICD-10-CM | POA: Diagnosis not present

## 2019-05-31 DIAGNOSIS — L820 Inflamed seborrheic keratosis: Secondary | ICD-10-CM | POA: Diagnosis not present

## 2019-05-31 DIAGNOSIS — C44622 Squamous cell carcinoma of skin of right upper limb, including shoulder: Secondary | ICD-10-CM | POA: Diagnosis not present

## 2019-05-31 DIAGNOSIS — Z779 Other contact with and (suspected) exposures hazardous to health: Secondary | ICD-10-CM | POA: Diagnosis not present

## 2019-06-02 DIAGNOSIS — G894 Chronic pain syndrome: Secondary | ICD-10-CM | POA: Diagnosis not present

## 2019-06-04 DIAGNOSIS — I10 Essential (primary) hypertension: Secondary | ICD-10-CM | POA: Diagnosis not present

## 2019-06-04 DIAGNOSIS — Z125 Encounter for screening for malignant neoplasm of prostate: Secondary | ICD-10-CM | POA: Diagnosis not present

## 2019-06-04 DIAGNOSIS — Z13220 Encounter for screening for lipoid disorders: Secondary | ICD-10-CM | POA: Diagnosis not present

## 2019-06-06 DIAGNOSIS — R0902 Hypoxemia: Secondary | ICD-10-CM | POA: Diagnosis not present

## 2019-06-06 DIAGNOSIS — R42 Dizziness and giddiness: Secondary | ICD-10-CM | POA: Diagnosis not present

## 2019-06-06 DIAGNOSIS — I10 Essential (primary) hypertension: Secondary | ICD-10-CM | POA: Diagnosis not present

## 2019-06-06 DIAGNOSIS — R29810 Facial weakness: Secondary | ICD-10-CM | POA: Diagnosis not present

## 2019-06-06 DIAGNOSIS — R531 Weakness: Secondary | ICD-10-CM | POA: Diagnosis not present

## 2019-06-07 DIAGNOSIS — F0280 Dementia in other diseases classified elsewhere without behavioral disturbance: Secondary | ICD-10-CM | POA: Diagnosis not present

## 2019-06-07 DIAGNOSIS — E039 Hypothyroidism, unspecified: Secondary | ICD-10-CM | POA: Diagnosis not present

## 2019-06-07 DIAGNOSIS — I739 Peripheral vascular disease, unspecified: Secondary | ICD-10-CM | POA: Diagnosis not present

## 2019-06-07 DIAGNOSIS — E7849 Other hyperlipidemia: Secondary | ICD-10-CM | POA: Diagnosis not present

## 2019-06-07 DIAGNOSIS — G311 Senile degeneration of brain, not elsewhere classified: Secondary | ICD-10-CM | POA: Diagnosis not present

## 2019-06-07 DIAGNOSIS — R600 Localized edema: Secondary | ICD-10-CM | POA: Diagnosis not present

## 2019-06-07 DIAGNOSIS — G4089 Other seizures: Secondary | ICD-10-CM | POA: Diagnosis not present

## 2019-06-07 DIAGNOSIS — D638 Anemia in other chronic diseases classified elsewhere: Secondary | ICD-10-CM | POA: Diagnosis not present

## 2019-06-07 DIAGNOSIS — R1907 Generalized intra-abdominal and pelvic swelling, mass and lump: Secondary | ICD-10-CM | POA: Diagnosis not present

## 2019-06-07 DIAGNOSIS — E119 Type 2 diabetes mellitus without complications: Secondary | ICD-10-CM | POA: Diagnosis not present

## 2019-06-07 DIAGNOSIS — K7460 Unspecified cirrhosis of liver: Secondary | ICD-10-CM | POA: Diagnosis not present

## 2019-06-07 DIAGNOSIS — R112 Nausea with vomiting, unspecified: Secondary | ICD-10-CM | POA: Diagnosis not present

## 2019-06-08 DIAGNOSIS — D696 Thrombocytopenia, unspecified: Secondary | ICD-10-CM | POA: Diagnosis not present

## 2019-06-08 DIAGNOSIS — W19XXXD Unspecified fall, subsequent encounter: Secondary | ICD-10-CM | POA: Diagnosis not present

## 2019-06-08 DIAGNOSIS — C787 Secondary malignant neoplasm of liver and intrahepatic bile duct: Secondary | ICD-10-CM | POA: Diagnosis not present

## 2019-06-08 DIAGNOSIS — Z8542 Personal history of malignant neoplasm of other parts of uterus: Secondary | ICD-10-CM | POA: Diagnosis not present

## 2019-06-08 DIAGNOSIS — Z7189 Other specified counseling: Secondary | ICD-10-CM | POA: Diagnosis not present

## 2019-06-08 DIAGNOSIS — K819 Cholecystitis, unspecified: Secondary | ICD-10-CM | POA: Diagnosis not present

## 2019-06-08 DIAGNOSIS — L97521 Non-pressure chronic ulcer of other part of left foot limited to breakdown of skin: Secondary | ICD-10-CM | POA: Diagnosis not present

## 2019-06-08 DIAGNOSIS — R918 Other nonspecific abnormal finding of lung field: Secondary | ICD-10-CM | POA: Diagnosis not present

## 2019-06-08 DIAGNOSIS — J449 Chronic obstructive pulmonary disease, unspecified: Secondary | ICD-10-CM | POA: Diagnosis not present

## 2019-06-08 DIAGNOSIS — Z0000 Encounter for general adult medical examination without abnormal findings: Secondary | ICD-10-CM | POA: Diagnosis not present

## 2019-06-08 DIAGNOSIS — I159 Secondary hypertension, unspecified: Secondary | ICD-10-CM | POA: Diagnosis not present

## 2019-06-08 DIAGNOSIS — R1907 Generalized intra-abdominal and pelvic swelling, mass and lump: Secondary | ICD-10-CM | POA: Diagnosis not present

## 2019-06-08 DIAGNOSIS — K7460 Unspecified cirrhosis of liver: Secondary | ICD-10-CM | POA: Diagnosis not present

## 2019-06-08 DIAGNOSIS — Z20828 Contact with and (suspected) exposure to other viral communicable diseases: Secondary | ICD-10-CM | POA: Diagnosis not present

## 2019-06-08 DIAGNOSIS — C7951 Secondary malignant neoplasm of bone: Secondary | ICD-10-CM | POA: Diagnosis not present

## 2019-06-08 DIAGNOSIS — M064 Inflammatory polyarthropathy: Secondary | ICD-10-CM | POA: Diagnosis not present

## 2019-06-08 DIAGNOSIS — R112 Nausea with vomiting, unspecified: Secondary | ICD-10-CM | POA: Diagnosis not present

## 2019-06-08 DIAGNOSIS — G629 Polyneuropathy, unspecified: Secondary | ICD-10-CM | POA: Diagnosis not present

## 2019-06-08 DIAGNOSIS — Z9181 History of falling: Secondary | ICD-10-CM | POA: Diagnosis not present

## 2019-06-08 DIAGNOSIS — E039 Hypothyroidism, unspecified: Secondary | ICD-10-CM | POA: Diagnosis not present

## 2019-06-08 DIAGNOSIS — E7800 Pure hypercholesterolemia, unspecified: Secondary | ICD-10-CM | POA: Diagnosis not present

## 2019-06-08 DIAGNOSIS — M1990 Unspecified osteoarthritis, unspecified site: Secondary | ICD-10-CM | POA: Diagnosis not present

## 2019-06-08 DIAGNOSIS — G893 Neoplasm related pain (acute) (chronic): Secondary | ICD-10-CM | POA: Diagnosis not present

## 2019-06-08 DIAGNOSIS — R600 Localized edema: Secondary | ICD-10-CM | POA: Diagnosis not present

## 2019-06-08 DIAGNOSIS — I890 Lymphedema, not elsewhere classified: Secondary | ICD-10-CM | POA: Diagnosis not present

## 2019-06-09 DIAGNOSIS — E7800 Pure hypercholesterolemia, unspecified: Secondary | ICD-10-CM | POA: Diagnosis not present

## 2019-06-09 DIAGNOSIS — E1165 Type 2 diabetes mellitus with hyperglycemia: Secondary | ICD-10-CM | POA: Diagnosis not present

## 2019-06-09 DIAGNOSIS — R55 Syncope and collapse: Secondary | ICD-10-CM | POA: Diagnosis not present

## 2019-06-09 DIAGNOSIS — I10 Essential (primary) hypertension: Secondary | ICD-10-CM | POA: Diagnosis not present

## 2019-06-09 DIAGNOSIS — Z79899 Other long term (current) drug therapy: Secondary | ICD-10-CM | POA: Diagnosis not present

## 2019-06-09 DIAGNOSIS — G459 Transient cerebral ischemic attack, unspecified: Secondary | ICD-10-CM | POA: Diagnosis not present

## 2019-06-09 DIAGNOSIS — E039 Hypothyroidism, unspecified: Secondary | ICD-10-CM | POA: Diagnosis not present

## 2019-06-11 DIAGNOSIS — B9681 Helicobacter pylori [H. pylori] as the cause of diseases classified elsewhere: Secondary | ICD-10-CM | POA: Diagnosis not present

## 2019-06-11 DIAGNOSIS — K2951 Unspecified chronic gastritis with bleeding: Secondary | ICD-10-CM | POA: Diagnosis not present

## 2019-06-12 DIAGNOSIS — Z1159 Encounter for screening for other viral diseases: Secondary | ICD-10-CM | POA: Diagnosis not present

## 2019-06-12 DIAGNOSIS — B379 Candidiasis, unspecified: Secondary | ICD-10-CM | POA: Diagnosis not present

## 2019-06-12 DIAGNOSIS — N390 Urinary tract infection, site not specified: Secondary | ICD-10-CM | POA: Diagnosis not present

## 2019-06-12 DIAGNOSIS — A499 Bacterial infection, unspecified: Secondary | ICD-10-CM | POA: Diagnosis not present

## 2019-06-12 DIAGNOSIS — R399 Unspecified symptoms and signs involving the genitourinary system: Secondary | ICD-10-CM | POA: Diagnosis not present

## 2019-06-13 DIAGNOSIS — S20219A Contusion of unspecified front wall of thorax, initial encounter: Secondary | ICD-10-CM | POA: Diagnosis not present

## 2019-06-13 DIAGNOSIS — F10921 Alcohol use, unspecified with intoxication delirium: Secondary | ICD-10-CM | POA: Diagnosis not present

## 2019-06-13 DIAGNOSIS — R410 Disorientation, unspecified: Secondary | ICD-10-CM | POA: Diagnosis not present

## 2019-06-15 DIAGNOSIS — M129 Arthropathy, unspecified: Secondary | ICD-10-CM | POA: Diagnosis not present

## 2019-06-15 DIAGNOSIS — G8929 Other chronic pain: Secondary | ICD-10-CM | POA: Diagnosis not present

## 2019-06-15 DIAGNOSIS — Z03818 Encounter for observation for suspected exposure to other biological agents ruled out: Secondary | ICD-10-CM | POA: Diagnosis not present

## 2019-06-15 DIAGNOSIS — M545 Low back pain: Secondary | ICD-10-CM | POA: Diagnosis not present

## 2019-06-15 DIAGNOSIS — M542 Cervicalgia: Secondary | ICD-10-CM | POA: Diagnosis not present

## 2019-06-16 DIAGNOSIS — K275 Chronic or unspecified peptic ulcer, site unspecified, with perforation: Secondary | ICD-10-CM | POA: Diagnosis not present

## 2019-06-16 DIAGNOSIS — K631 Perforation of intestine (nontraumatic): Secondary | ICD-10-CM | POA: Diagnosis not present

## 2019-06-16 DIAGNOSIS — C3492 Malignant neoplasm of unspecified part of left bronchus or lung: Secondary | ICD-10-CM | POA: Diagnosis not present

## 2019-06-16 DIAGNOSIS — S22080S Wedge compression fracture of T11-T12 vertebra, sequela: Secondary | ICD-10-CM | POA: Diagnosis not present

## 2019-06-16 DIAGNOSIS — R4182 Altered mental status, unspecified: Secondary | ICD-10-CM | POA: Diagnosis not present

## 2019-06-16 DIAGNOSIS — R530 Neoplastic (malignant) related fatigue: Secondary | ICD-10-CM | POA: Diagnosis not present

## 2019-06-16 DIAGNOSIS — R7989 Other specified abnormal findings of blood chemistry: Secondary | ICD-10-CM | POA: Diagnosis not present

## 2019-06-18 DIAGNOSIS — I10 Essential (primary) hypertension: Secondary | ICD-10-CM | POA: Diagnosis not present

## 2019-06-18 DIAGNOSIS — I200 Unstable angina: Secondary | ICD-10-CM | POA: Diagnosis not present

## 2019-06-18 DIAGNOSIS — I739 Peripheral vascular disease, unspecified: Secondary | ICD-10-CM | POA: Diagnosis not present

## 2019-06-18 DIAGNOSIS — I2510 Atherosclerotic heart disease of native coronary artery without angina pectoris: Secondary | ICD-10-CM | POA: Diagnosis not present

## 2019-06-19 DIAGNOSIS — M1990 Unspecified osteoarthritis, unspecified site: Secondary | ICD-10-CM | POA: Diagnosis not present

## 2019-06-19 DIAGNOSIS — K5903 Drug induced constipation: Secondary | ICD-10-CM | POA: Diagnosis not present

## 2019-06-19 DIAGNOSIS — F329 Major depressive disorder, single episode, unspecified: Secondary | ICD-10-CM | POA: Diagnosis not present

## 2019-06-19 DIAGNOSIS — F0280 Dementia in other diseases classified elsewhere without behavioral disturbance: Secondary | ICD-10-CM | POA: Diagnosis not present

## 2019-06-19 DIAGNOSIS — I739 Peripheral vascular disease, unspecified: Secondary | ICD-10-CM | POA: Diagnosis not present

## 2019-06-19 DIAGNOSIS — G4733 Obstructive sleep apnea (adult) (pediatric): Secondary | ICD-10-CM | POA: Diagnosis not present

## 2019-06-19 DIAGNOSIS — T402X5D Adverse effect of other opioids, subsequent encounter: Secondary | ICD-10-CM | POA: Diagnosis not present

## 2019-06-19 DIAGNOSIS — N390 Urinary tract infection, site not specified: Secondary | ICD-10-CM | POA: Diagnosis not present

## 2019-06-19 DIAGNOSIS — R2689 Other abnormalities of gait and mobility: Secondary | ICD-10-CM | POA: Diagnosis not present

## 2019-06-19 DIAGNOSIS — Z9181 History of falling: Secondary | ICD-10-CM | POA: Diagnosis not present

## 2019-06-19 DIAGNOSIS — I2510 Atherosclerotic heart disease of native coronary artery without angina pectoris: Secondary | ICD-10-CM | POA: Diagnosis not present

## 2019-06-19 DIAGNOSIS — G309 Alzheimer's disease, unspecified: Secondary | ICD-10-CM | POA: Diagnosis not present

## 2019-06-19 DIAGNOSIS — Z8673 Personal history of transient ischemic attack (TIA), and cerebral infarction without residual deficits: Secondary | ICD-10-CM | POA: Diagnosis not present

## 2019-06-19 DIAGNOSIS — R41841 Cognitive communication deficit: Secondary | ICD-10-CM | POA: Diagnosis not present

## 2019-06-19 DIAGNOSIS — M6281 Muscle weakness (generalized): Secondary | ICD-10-CM | POA: Diagnosis not present

## 2019-06-19 DIAGNOSIS — I10 Essential (primary) hypertension: Secondary | ICD-10-CM | POA: Diagnosis not present

## 2019-06-19 DIAGNOSIS — F0150 Vascular dementia without behavioral disturbance: Secondary | ICD-10-CM | POA: Diagnosis not present

## 2019-06-19 DIAGNOSIS — M80051D Age-related osteoporosis with current pathological fracture, right femur, subsequent encounter for fracture with routine healing: Secondary | ICD-10-CM | POA: Diagnosis not present

## 2019-06-19 DIAGNOSIS — Z7982 Long term (current) use of aspirin: Secondary | ICD-10-CM | POA: Diagnosis not present

## 2019-06-19 DIAGNOSIS — R339 Retention of urine, unspecified: Secondary | ICD-10-CM | POA: Diagnosis not present

## 2019-06-19 DIAGNOSIS — F418 Other specified anxiety disorders: Secondary | ICD-10-CM | POA: Diagnosis not present

## 2019-06-19 DIAGNOSIS — I200 Unstable angina: Secondary | ICD-10-CM | POA: Diagnosis not present

## 2019-06-20 DIAGNOSIS — D692 Other nonthrombocytopenic purpura: Secondary | ICD-10-CM | POA: Diagnosis not present

## 2019-06-20 DIAGNOSIS — K449 Diaphragmatic hernia without obstruction or gangrene: Secondary | ICD-10-CM | POA: Diagnosis not present

## 2019-06-20 DIAGNOSIS — G8929 Other chronic pain: Secondary | ICD-10-CM | POA: Diagnosis not present

## 2019-06-20 DIAGNOSIS — J189 Pneumonia, unspecified organism: Secondary | ICD-10-CM | POA: Diagnosis not present

## 2019-06-20 DIAGNOSIS — K2980 Duodenitis without bleeding: Secondary | ICD-10-CM | POA: Diagnosis not present

## 2019-06-20 DIAGNOSIS — I4891 Unspecified atrial fibrillation: Secondary | ICD-10-CM | POA: Diagnosis not present

## 2019-06-20 DIAGNOSIS — E039 Hypothyroidism, unspecified: Secondary | ICD-10-CM | POA: Diagnosis not present

## 2019-06-20 DIAGNOSIS — A419 Sepsis, unspecified organism: Secondary | ICD-10-CM | POA: Diagnosis not present

## 2019-06-20 DIAGNOSIS — N390 Urinary tract infection, site not specified: Secondary | ICD-10-CM | POA: Diagnosis not present

## 2019-06-20 DIAGNOSIS — E1122 Type 2 diabetes mellitus with diabetic chronic kidney disease: Secondary | ICD-10-CM | POA: Diagnosis not present

## 2019-06-20 DIAGNOSIS — K5720 Diverticulitis of large intestine with perforation and abscess without bleeding: Secondary | ICD-10-CM | POA: Diagnosis not present

## 2019-06-20 DIAGNOSIS — M1990 Unspecified osteoarthritis, unspecified site: Secondary | ICD-10-CM | POA: Diagnosis not present

## 2019-06-20 DIAGNOSIS — I10 Essential (primary) hypertension: Secondary | ICD-10-CM | POA: Diagnosis not present

## 2019-06-20 DIAGNOSIS — F0280 Dementia in other diseases classified elsewhere without behavioral disturbance: Secondary | ICD-10-CM | POA: Diagnosis not present

## 2019-06-20 DIAGNOSIS — N183 Chronic kidney disease, stage 3 (moderate): Secondary | ICD-10-CM | POA: Diagnosis not present

## 2019-06-20 DIAGNOSIS — D510 Vitamin B12 deficiency anemia due to intrinsic factor deficiency: Secondary | ICD-10-CM | POA: Diagnosis not present

## 2019-06-20 DIAGNOSIS — K589 Irritable bowel syndrome without diarrhea: Secondary | ICD-10-CM | POA: Diagnosis not present

## 2019-06-21 DIAGNOSIS — G9340 Encephalopathy, unspecified: Secondary | ICD-10-CM | POA: Diagnosis not present

## 2019-06-21 DIAGNOSIS — J189 Pneumonia, unspecified organism: Secondary | ICD-10-CM | POA: Diagnosis not present

## 2019-06-21 DIAGNOSIS — N390 Urinary tract infection, site not specified: Secondary | ICD-10-CM | POA: Diagnosis not present

## 2019-06-21 DIAGNOSIS — A419 Sepsis, unspecified organism: Secondary | ICD-10-CM | POA: Diagnosis not present

## 2019-06-21 DIAGNOSIS — L89159 Pressure ulcer of sacral region, unspecified stage: Secondary | ICD-10-CM | POA: Diagnosis not present

## 2019-06-22 DIAGNOSIS — J449 Chronic obstructive pulmonary disease, unspecified: Secondary | ICD-10-CM | POA: Diagnosis not present

## 2019-06-22 DIAGNOSIS — L89159 Pressure ulcer of sacral region, unspecified stage: Secondary | ICD-10-CM | POA: Diagnosis not present

## 2019-06-22 DIAGNOSIS — N390 Urinary tract infection, site not specified: Secondary | ICD-10-CM | POA: Diagnosis not present

## 2019-06-22 DIAGNOSIS — D229 Melanocytic nevi, unspecified: Secondary | ICD-10-CM | POA: Diagnosis not present

## 2019-06-22 DIAGNOSIS — J189 Pneumonia, unspecified organism: Secondary | ICD-10-CM | POA: Diagnosis not present

## 2019-06-22 DIAGNOSIS — G9340 Encephalopathy, unspecified: Secondary | ICD-10-CM | POA: Diagnosis not present

## 2019-06-22 DIAGNOSIS — A419 Sepsis, unspecified organism: Secondary | ICD-10-CM | POA: Diagnosis not present

## 2019-06-22 DIAGNOSIS — R42 Dizziness and giddiness: Secondary | ICD-10-CM | POA: Diagnosis not present

## 2019-06-23 DIAGNOSIS — N390 Urinary tract infection, site not specified: Secondary | ICD-10-CM | POA: Diagnosis not present

## 2019-06-23 DIAGNOSIS — G9340 Encephalopathy, unspecified: Secondary | ICD-10-CM | POA: Diagnosis not present

## 2019-06-23 DIAGNOSIS — A419 Sepsis, unspecified organism: Secondary | ICD-10-CM | POA: Diagnosis not present

## 2019-06-23 DIAGNOSIS — J189 Pneumonia, unspecified organism: Secondary | ICD-10-CM | POA: Diagnosis not present

## 2019-06-23 DIAGNOSIS — L89159 Pressure ulcer of sacral region, unspecified stage: Secondary | ICD-10-CM | POA: Diagnosis not present

## 2019-06-24 DIAGNOSIS — K7469 Other cirrhosis of liver: Secondary | ICD-10-CM | POA: Diagnosis not present

## 2019-06-24 DIAGNOSIS — D509 Iron deficiency anemia, unspecified: Secondary | ICD-10-CM | POA: Diagnosis not present

## 2019-06-24 DIAGNOSIS — L89159 Pressure ulcer of sacral region, unspecified stage: Secondary | ICD-10-CM | POA: Diagnosis not present

## 2019-06-24 DIAGNOSIS — I700 Atherosclerosis of aorta: Secondary | ICD-10-CM | POA: Diagnosis not present

## 2019-06-24 DIAGNOSIS — G9340 Encephalopathy, unspecified: Secondary | ICD-10-CM | POA: Diagnosis not present

## 2019-06-24 DIAGNOSIS — F0281 Dementia in other diseases classified elsewhere with behavioral disturbance: Secondary | ICD-10-CM | POA: Diagnosis not present

## 2019-06-24 DIAGNOSIS — C61 Malignant neoplasm of prostate: Secondary | ICD-10-CM | POA: Diagnosis not present

## 2019-06-24 DIAGNOSIS — J189 Pneumonia, unspecified organism: Secondary | ICD-10-CM | POA: Diagnosis not present

## 2019-06-24 DIAGNOSIS — I456 Pre-excitation syndrome: Secondary | ICD-10-CM | POA: Diagnosis not present

## 2019-06-24 DIAGNOSIS — E538 Deficiency of other specified B group vitamins: Secondary | ICD-10-CM | POA: Diagnosis not present

## 2019-06-24 DIAGNOSIS — C3431 Malignant neoplasm of lower lobe, right bronchus or lung: Secondary | ICD-10-CM | POA: Diagnosis not present

## 2019-06-24 DIAGNOSIS — R634 Abnormal weight loss: Secondary | ICD-10-CM | POA: Diagnosis not present

## 2019-06-24 DIAGNOSIS — E43 Unspecified severe protein-calorie malnutrition: Secondary | ICD-10-CM | POA: Diagnosis not present

## 2019-06-24 DIAGNOSIS — M810 Age-related osteoporosis without current pathological fracture: Secondary | ICD-10-CM | POA: Diagnosis not present

## 2019-06-24 DIAGNOSIS — R627 Adult failure to thrive: Secondary | ICD-10-CM | POA: Diagnosis not present

## 2019-06-24 DIAGNOSIS — G309 Alzheimer's disease, unspecified: Secondary | ICD-10-CM | POA: Diagnosis not present

## 2019-06-24 DIAGNOSIS — A419 Sepsis, unspecified organism: Secondary | ICD-10-CM | POA: Diagnosis not present

## 2019-06-24 DIAGNOSIS — E7800 Pure hypercholesterolemia, unspecified: Secondary | ICD-10-CM | POA: Diagnosis not present

## 2019-06-24 DIAGNOSIS — I119 Hypertensive heart disease without heart failure: Secondary | ICD-10-CM | POA: Diagnosis not present

## 2019-06-24 DIAGNOSIS — F0151 Vascular dementia with behavioral disturbance: Secondary | ICD-10-CM | POA: Diagnosis not present

## 2019-06-24 DIAGNOSIS — N390 Urinary tract infection, site not specified: Secondary | ICD-10-CM | POA: Diagnosis not present

## 2019-06-24 DIAGNOSIS — K5909 Other constipation: Secondary | ICD-10-CM | POA: Diagnosis not present

## 2019-06-25 DIAGNOSIS — J9611 Chronic respiratory failure with hypoxia: Secondary | ICD-10-CM | POA: Diagnosis not present

## 2019-06-25 DIAGNOSIS — G4733 Obstructive sleep apnea (adult) (pediatric): Secondary | ICD-10-CM | POA: Diagnosis not present

## 2019-06-25 DIAGNOSIS — J441 Chronic obstructive pulmonary disease with (acute) exacerbation: Secondary | ICD-10-CM | POA: Diagnosis not present

## 2019-06-25 DIAGNOSIS — E119 Type 2 diabetes mellitus without complications: Secondary | ICD-10-CM | POA: Diagnosis not present

## 2019-06-25 DIAGNOSIS — C3492 Malignant neoplasm of unspecified part of left bronchus or lung: Secondary | ICD-10-CM | POA: Diagnosis not present

## 2019-06-25 DIAGNOSIS — J9612 Chronic respiratory failure with hypercapnia: Secondary | ICD-10-CM | POA: Diagnosis not present

## 2019-06-25 DIAGNOSIS — N390 Urinary tract infection, site not specified: Secondary | ICD-10-CM | POA: Diagnosis not present

## 2019-06-25 DIAGNOSIS — I4891 Unspecified atrial fibrillation: Secondary | ICD-10-CM | POA: Diagnosis not present

## 2019-06-25 DIAGNOSIS — G9340 Encephalopathy, unspecified: Secondary | ICD-10-CM | POA: Diagnosis not present

## 2019-06-25 DIAGNOSIS — J189 Pneumonia, unspecified organism: Secondary | ICD-10-CM | POA: Diagnosis not present

## 2019-06-25 DIAGNOSIS — K5090 Crohn's disease, unspecified, without complications: Secondary | ICD-10-CM | POA: Diagnosis not present

## 2019-06-25 DIAGNOSIS — K219 Gastro-esophageal reflux disease without esophagitis: Secondary | ICD-10-CM | POA: Diagnosis not present

## 2019-06-25 DIAGNOSIS — I509 Heart failure, unspecified: Secondary | ICD-10-CM | POA: Diagnosis not present

## 2019-06-25 DIAGNOSIS — A419 Sepsis, unspecified organism: Secondary | ICD-10-CM | POA: Diagnosis not present

## 2019-06-25 DIAGNOSIS — I110 Hypertensive heart disease with heart failure: Secondary | ICD-10-CM | POA: Diagnosis not present

## 2019-06-25 DIAGNOSIS — Z6836 Body mass index (BMI) 36.0-36.9, adult: Secondary | ICD-10-CM | POA: Diagnosis not present

## 2019-06-25 DIAGNOSIS — L89159 Pressure ulcer of sacral region, unspecified stage: Secondary | ICD-10-CM | POA: Diagnosis not present

## 2019-06-26 DIAGNOSIS — E119 Type 2 diabetes mellitus without complications: Secondary | ICD-10-CM | POA: Diagnosis not present

## 2019-06-26 DIAGNOSIS — G4733 Obstructive sleep apnea (adult) (pediatric): Secondary | ICD-10-CM | POA: Diagnosis not present

## 2019-06-26 DIAGNOSIS — J9612 Chronic respiratory failure with hypercapnia: Secondary | ICD-10-CM | POA: Diagnosis not present

## 2019-06-26 DIAGNOSIS — K219 Gastro-esophageal reflux disease without esophagitis: Secondary | ICD-10-CM | POA: Diagnosis not present

## 2019-06-26 DIAGNOSIS — I110 Hypertensive heart disease with heart failure: Secondary | ICD-10-CM | POA: Diagnosis not present

## 2019-06-26 DIAGNOSIS — G9340 Encephalopathy, unspecified: Secondary | ICD-10-CM | POA: Diagnosis not present

## 2019-06-26 DIAGNOSIS — I4891 Unspecified atrial fibrillation: Secondary | ICD-10-CM | POA: Diagnosis not present

## 2019-06-26 DIAGNOSIS — Z6836 Body mass index (BMI) 36.0-36.9, adult: Secondary | ICD-10-CM | POA: Diagnosis not present

## 2019-06-26 DIAGNOSIS — C3492 Malignant neoplasm of unspecified part of left bronchus or lung: Secondary | ICD-10-CM | POA: Diagnosis not present

## 2019-06-26 DIAGNOSIS — J9611 Chronic respiratory failure with hypoxia: Secondary | ICD-10-CM | POA: Diagnosis not present

## 2019-06-26 DIAGNOSIS — I509 Heart failure, unspecified: Secondary | ICD-10-CM | POA: Diagnosis not present

## 2019-06-26 DIAGNOSIS — J189 Pneumonia, unspecified organism: Secondary | ICD-10-CM | POA: Diagnosis not present

## 2019-06-26 DIAGNOSIS — J441 Chronic obstructive pulmonary disease with (acute) exacerbation: Secondary | ICD-10-CM | POA: Diagnosis not present

## 2019-06-26 DIAGNOSIS — A419 Sepsis, unspecified organism: Secondary | ICD-10-CM | POA: Diagnosis not present

## 2019-06-26 DIAGNOSIS — K5090 Crohn's disease, unspecified, without complications: Secondary | ICD-10-CM | POA: Diagnosis not present

## 2019-06-26 DIAGNOSIS — L89159 Pressure ulcer of sacral region, unspecified stage: Secondary | ICD-10-CM | POA: Diagnosis not present

## 2019-06-27 DIAGNOSIS — A419 Sepsis, unspecified organism: Secondary | ICD-10-CM | POA: Diagnosis not present

## 2019-06-27 DIAGNOSIS — J189 Pneumonia, unspecified organism: Secondary | ICD-10-CM | POA: Diagnosis not present

## 2019-06-27 DIAGNOSIS — L89159 Pressure ulcer of sacral region, unspecified stage: Secondary | ICD-10-CM | POA: Diagnosis not present

## 2019-06-27 DIAGNOSIS — G9340 Encephalopathy, unspecified: Secondary | ICD-10-CM | POA: Diagnosis not present

## 2019-06-28 DIAGNOSIS — C3492 Malignant neoplasm of unspecified part of left bronchus or lung: Secondary | ICD-10-CM | POA: Diagnosis not present

## 2019-06-28 DIAGNOSIS — E039 Hypothyroidism, unspecified: Secondary | ICD-10-CM | POA: Diagnosis not present

## 2019-06-28 DIAGNOSIS — K449 Diaphragmatic hernia without obstruction or gangrene: Secondary | ICD-10-CM | POA: Diagnosis not present

## 2019-06-28 DIAGNOSIS — E1122 Type 2 diabetes mellitus with diabetic chronic kidney disease: Secondary | ICD-10-CM | POA: Diagnosis not present

## 2019-06-28 DIAGNOSIS — K219 Gastro-esophageal reflux disease without esophagitis: Secondary | ICD-10-CM | POA: Diagnosis not present

## 2019-06-28 DIAGNOSIS — J9612 Chronic respiratory failure with hypercapnia: Secondary | ICD-10-CM | POA: Diagnosis not present

## 2019-06-28 DIAGNOSIS — I10 Essential (primary) hypertension: Secondary | ICD-10-CM | POA: Diagnosis not present

## 2019-06-28 DIAGNOSIS — J9611 Chronic respiratory failure with hypoxia: Secondary | ICD-10-CM | POA: Diagnosis not present

## 2019-06-28 DIAGNOSIS — K5090 Crohn's disease, unspecified, without complications: Secondary | ICD-10-CM | POA: Diagnosis not present

## 2019-06-28 DIAGNOSIS — G8929 Other chronic pain: Secondary | ICD-10-CM | POA: Diagnosis not present

## 2019-06-28 DIAGNOSIS — G4733 Obstructive sleep apnea (adult) (pediatric): Secondary | ICD-10-CM | POA: Diagnosis not present

## 2019-06-28 DIAGNOSIS — E119 Type 2 diabetes mellitus without complications: Secondary | ICD-10-CM | POA: Diagnosis not present

## 2019-06-28 DIAGNOSIS — I110 Hypertensive heart disease with heart failure: Secondary | ICD-10-CM | POA: Diagnosis not present

## 2019-06-28 DIAGNOSIS — K2980 Duodenitis without bleeding: Secondary | ICD-10-CM | POA: Diagnosis not present

## 2019-06-28 DIAGNOSIS — D692 Other nonthrombocytopenic purpura: Secondary | ICD-10-CM | POA: Diagnosis not present

## 2019-06-28 DIAGNOSIS — F0280 Dementia in other diseases classified elsewhere without behavioral disturbance: Secondary | ICD-10-CM | POA: Diagnosis not present

## 2019-06-28 DIAGNOSIS — K589 Irritable bowel syndrome without diarrhea: Secondary | ICD-10-CM | POA: Diagnosis not present

## 2019-06-28 DIAGNOSIS — D510 Vitamin B12 deficiency anemia due to intrinsic factor deficiency: Secondary | ICD-10-CM | POA: Diagnosis not present

## 2019-06-28 DIAGNOSIS — M1990 Unspecified osteoarthritis, unspecified site: Secondary | ICD-10-CM | POA: Diagnosis not present

## 2019-06-28 DIAGNOSIS — J441 Chronic obstructive pulmonary disease with (acute) exacerbation: Secondary | ICD-10-CM | POA: Diagnosis not present

## 2019-06-28 DIAGNOSIS — N183 Chronic kidney disease, stage 3 (moderate): Secondary | ICD-10-CM | POA: Diagnosis not present

## 2019-06-28 DIAGNOSIS — I4891 Unspecified atrial fibrillation: Secondary | ICD-10-CM | POA: Diagnosis not present

## 2019-06-28 DIAGNOSIS — I509 Heart failure, unspecified: Secondary | ICD-10-CM | POA: Diagnosis not present

## 2019-06-28 DIAGNOSIS — Z6836 Body mass index (BMI) 36.0-36.9, adult: Secondary | ICD-10-CM | POA: Diagnosis not present

## 2019-06-28 DIAGNOSIS — K5720 Diverticulitis of large intestine with perforation and abscess without bleeding: Secondary | ICD-10-CM | POA: Diagnosis not present

## 2019-06-29 DIAGNOSIS — R233 Spontaneous ecchymoses: Secondary | ICD-10-CM | POA: Diagnosis not present

## 2019-06-29 DIAGNOSIS — F419 Anxiety disorder, unspecified: Secondary | ICD-10-CM | POA: Diagnosis not present

## 2019-06-29 DIAGNOSIS — N760 Acute vaginitis: Secondary | ICD-10-CM | POA: Diagnosis not present

## 2019-06-29 DIAGNOSIS — I10 Essential (primary) hypertension: Secondary | ICD-10-CM | POA: Diagnosis not present

## 2019-07-01 DIAGNOSIS — G309 Alzheimer's disease, unspecified: Secondary | ICD-10-CM | POA: Diagnosis not present

## 2019-07-01 DIAGNOSIS — F0281 Dementia in other diseases classified elsewhere with behavioral disturbance: Secondary | ICD-10-CM | POA: Diagnosis not present

## 2019-07-01 DIAGNOSIS — D509 Iron deficiency anemia, unspecified: Secondary | ICD-10-CM | POA: Diagnosis not present

## 2019-07-01 DIAGNOSIS — F0151 Vascular dementia with behavioral disturbance: Secondary | ICD-10-CM | POA: Diagnosis not present

## 2019-07-01 DIAGNOSIS — E7800 Pure hypercholesterolemia, unspecified: Secondary | ICD-10-CM | POA: Diagnosis not present

## 2019-07-01 DIAGNOSIS — C61 Malignant neoplasm of prostate: Secondary | ICD-10-CM | POA: Diagnosis not present

## 2019-07-01 DIAGNOSIS — K5909 Other constipation: Secondary | ICD-10-CM | POA: Diagnosis not present

## 2019-07-01 DIAGNOSIS — M810 Age-related osteoporosis without current pathological fracture: Secondary | ICD-10-CM | POA: Diagnosis not present

## 2019-07-01 DIAGNOSIS — R634 Abnormal weight loss: Secondary | ICD-10-CM | POA: Diagnosis not present

## 2019-07-01 DIAGNOSIS — E538 Deficiency of other specified B group vitamins: Secondary | ICD-10-CM | POA: Diagnosis not present

## 2019-07-01 DIAGNOSIS — I456 Pre-excitation syndrome: Secondary | ICD-10-CM | POA: Diagnosis not present

## 2019-07-01 DIAGNOSIS — I700 Atherosclerosis of aorta: Secondary | ICD-10-CM | POA: Diagnosis not present

## 2019-07-05 DIAGNOSIS — R531 Weakness: Secondary | ICD-10-CM | POA: Diagnosis not present

## 2019-07-05 DIAGNOSIS — N3941 Urge incontinence: Secondary | ICD-10-CM | POA: Diagnosis not present

## 2019-07-05 DIAGNOSIS — Z0000 Encounter for general adult medical examination without abnormal findings: Secondary | ICD-10-CM | POA: Diagnosis not present

## 2019-07-05 DIAGNOSIS — E1165 Type 2 diabetes mellitus with hyperglycemia: Secondary | ICD-10-CM | POA: Diagnosis not present

## 2019-07-05 DIAGNOSIS — Z1331 Encounter for screening for depression: Secondary | ICD-10-CM | POA: Diagnosis not present

## 2019-07-05 DIAGNOSIS — Z1211 Encounter for screening for malignant neoplasm of colon: Secondary | ICD-10-CM | POA: Diagnosis not present

## 2019-07-05 DIAGNOSIS — Z1339 Encounter for screening examination for other mental health and behavioral disorders: Secondary | ICD-10-CM | POA: Diagnosis not present

## 2019-07-05 DIAGNOSIS — R7303 Prediabetes: Secondary | ICD-10-CM | POA: Diagnosis not present

## 2019-07-05 DIAGNOSIS — R5383 Other fatigue: Secondary | ICD-10-CM | POA: Diagnosis not present

## 2019-07-05 DIAGNOSIS — E7800 Pure hypercholesterolemia, unspecified: Secondary | ICD-10-CM | POA: Diagnosis not present

## 2019-07-05 DIAGNOSIS — J449 Chronic obstructive pulmonary disease, unspecified: Secondary | ICD-10-CM | POA: Diagnosis not present

## 2019-07-05 DIAGNOSIS — Z79899 Other long term (current) drug therapy: Secondary | ICD-10-CM | POA: Diagnosis not present

## 2019-07-05 DIAGNOSIS — Z7189 Other specified counseling: Secondary | ICD-10-CM | POA: Diagnosis not present

## 2019-07-05 DIAGNOSIS — Z299 Encounter for prophylactic measures, unspecified: Secondary | ICD-10-CM | POA: Diagnosis not present

## 2019-07-05 DIAGNOSIS — R0602 Shortness of breath: Secondary | ICD-10-CM | POA: Diagnosis not present

## 2019-07-05 DIAGNOSIS — N201 Calculus of ureter: Secondary | ICD-10-CM | POA: Diagnosis not present

## 2019-07-05 DIAGNOSIS — Z6832 Body mass index (BMI) 32.0-32.9, adult: Secondary | ICD-10-CM | POA: Diagnosis not present

## 2019-07-05 DIAGNOSIS — R4020 Unspecified coma: Secondary | ICD-10-CM | POA: Diagnosis not present

## 2019-07-06 DIAGNOSIS — D485 Neoplasm of uncertain behavior of skin: Secondary | ICD-10-CM | POA: Diagnosis not present

## 2019-07-06 DIAGNOSIS — Z5181 Encounter for therapeutic drug level monitoring: Secondary | ICD-10-CM | POA: Diagnosis not present

## 2019-07-06 DIAGNOSIS — L821 Other seborrheic keratosis: Secondary | ICD-10-CM | POA: Diagnosis not present

## 2019-07-06 DIAGNOSIS — M19042 Primary osteoarthritis, left hand: Secondary | ICD-10-CM | POA: Diagnosis not present

## 2019-07-06 DIAGNOSIS — B9561 Methicillin susceptible Staphylococcus aureus infection as the cause of diseases classified elsewhere: Secondary | ICD-10-CM | POA: Diagnosis not present

## 2019-07-06 DIAGNOSIS — M48061 Spinal stenosis, lumbar region without neurogenic claudication: Secondary | ICD-10-CM | POA: Diagnosis not present

## 2019-07-06 DIAGNOSIS — Z6841 Body Mass Index (BMI) 40.0 and over, adult: Secondary | ICD-10-CM | POA: Diagnosis not present

## 2019-07-06 DIAGNOSIS — C44622 Squamous cell carcinoma of skin of right upper limb, including shoulder: Secondary | ICD-10-CM | POA: Diagnosis not present

## 2019-07-06 DIAGNOSIS — E1165 Type 2 diabetes mellitus with hyperglycemia: Secondary | ICD-10-CM | POA: Diagnosis not present

## 2019-07-06 DIAGNOSIS — C44722 Squamous cell carcinoma of skin of right lower limb, including hip: Secondary | ICD-10-CM | POA: Diagnosis not present

## 2019-07-06 DIAGNOSIS — M4626 Osteomyelitis of vertebra, lumbar region: Secondary | ICD-10-CM | POA: Diagnosis not present

## 2019-07-06 DIAGNOSIS — I10 Essential (primary) hypertension: Secondary | ICD-10-CM | POA: Diagnosis not present

## 2019-07-06 DIAGNOSIS — Z85828 Personal history of other malignant neoplasm of skin: Secondary | ICD-10-CM | POA: Diagnosis not present

## 2019-07-06 DIAGNOSIS — E785 Hyperlipidemia, unspecified: Secondary | ICD-10-CM | POA: Diagnosis not present

## 2019-07-06 DIAGNOSIS — E1169 Type 2 diabetes mellitus with other specified complication: Secondary | ICD-10-CM | POA: Diagnosis not present

## 2019-07-06 DIAGNOSIS — Z87891 Personal history of nicotine dependence: Secondary | ICD-10-CM | POA: Diagnosis not present

## 2019-07-06 DIAGNOSIS — M19041 Primary osteoarthritis, right hand: Secondary | ICD-10-CM | POA: Diagnosis not present

## 2019-07-06 DIAGNOSIS — L570 Actinic keratosis: Secondary | ICD-10-CM | POA: Diagnosis not present

## 2019-07-06 DIAGNOSIS — I509 Heart failure, unspecified: Secondary | ICD-10-CM | POA: Diagnosis not present

## 2019-07-06 DIAGNOSIS — Z792 Long term (current) use of antibiotics: Secondary | ICD-10-CM | POA: Diagnosis not present

## 2019-07-06 DIAGNOSIS — Z299 Encounter for prophylactic measures, unspecified: Secondary | ICD-10-CM | POA: Diagnosis not present

## 2019-07-06 DIAGNOSIS — Z452 Encounter for adjustment and management of vascular access device: Secondary | ICD-10-CM | POA: Diagnosis not present

## 2019-07-06 DIAGNOSIS — I4891 Unspecified atrial fibrillation: Secondary | ICD-10-CM | POA: Diagnosis not present

## 2019-07-06 DIAGNOSIS — M4726 Other spondylosis with radiculopathy, lumbar region: Secondary | ICD-10-CM | POA: Diagnosis not present

## 2019-07-06 DIAGNOSIS — D0471 Carcinoma in situ of skin of right lower limb, including hip: Secondary | ICD-10-CM | POA: Diagnosis not present

## 2019-07-07 DIAGNOSIS — N390 Urinary tract infection, site not specified: Secondary | ICD-10-CM | POA: Diagnosis not present

## 2019-07-07 DIAGNOSIS — I4891 Unspecified atrial fibrillation: Secondary | ICD-10-CM | POA: Diagnosis not present

## 2019-07-07 DIAGNOSIS — Z6836 Body mass index (BMI) 36.0-36.9, adult: Secondary | ICD-10-CM | POA: Diagnosis not present

## 2019-07-07 DIAGNOSIS — C3492 Malignant neoplasm of unspecified part of left bronchus or lung: Secondary | ICD-10-CM | POA: Diagnosis not present

## 2019-07-07 DIAGNOSIS — C61 Malignant neoplasm of prostate: Secondary | ICD-10-CM | POA: Diagnosis not present

## 2019-07-07 DIAGNOSIS — I700 Atherosclerosis of aorta: Secondary | ICD-10-CM | POA: Diagnosis not present

## 2019-07-07 DIAGNOSIS — K5909 Other constipation: Secondary | ICD-10-CM | POA: Diagnosis not present

## 2019-07-07 DIAGNOSIS — I456 Pre-excitation syndrome: Secondary | ICD-10-CM | POA: Diagnosis not present

## 2019-07-07 DIAGNOSIS — E538 Deficiency of other specified B group vitamins: Secondary | ICD-10-CM | POA: Diagnosis not present

## 2019-07-07 DIAGNOSIS — G4733 Obstructive sleep apnea (adult) (pediatric): Secondary | ICD-10-CM | POA: Diagnosis not present

## 2019-07-07 DIAGNOSIS — F0281 Dementia in other diseases classified elsewhere with behavioral disturbance: Secondary | ICD-10-CM | POA: Diagnosis not present

## 2019-07-07 DIAGNOSIS — I509 Heart failure, unspecified: Secondary | ICD-10-CM | POA: Diagnosis not present

## 2019-07-07 DIAGNOSIS — J441 Chronic obstructive pulmonary disease with (acute) exacerbation: Secondary | ICD-10-CM | POA: Diagnosis not present

## 2019-07-07 DIAGNOSIS — F0151 Vascular dementia with behavioral disturbance: Secondary | ICD-10-CM | POA: Diagnosis not present

## 2019-07-07 DIAGNOSIS — K219 Gastro-esophageal reflux disease without esophagitis: Secondary | ICD-10-CM | POA: Diagnosis not present

## 2019-07-07 DIAGNOSIS — Z1231 Encounter for screening mammogram for malignant neoplasm of breast: Secondary | ICD-10-CM | POA: Diagnosis not present

## 2019-07-07 DIAGNOSIS — D509 Iron deficiency anemia, unspecified: Secondary | ICD-10-CM | POA: Diagnosis not present

## 2019-07-07 DIAGNOSIS — E7800 Pure hypercholesterolemia, unspecified: Secondary | ICD-10-CM | POA: Diagnosis not present

## 2019-07-07 DIAGNOSIS — G309 Alzheimer's disease, unspecified: Secondary | ICD-10-CM | POA: Diagnosis not present

## 2019-07-07 DIAGNOSIS — E119 Type 2 diabetes mellitus without complications: Secondary | ICD-10-CM | POA: Diagnosis not present

## 2019-07-07 DIAGNOSIS — K5090 Crohn's disease, unspecified, without complications: Secondary | ICD-10-CM | POA: Diagnosis not present

## 2019-07-07 DIAGNOSIS — Z803 Family history of malignant neoplasm of breast: Secondary | ICD-10-CM | POA: Diagnosis not present

## 2019-07-07 DIAGNOSIS — S2249XA Multiple fractures of ribs, unspecified side, initial encounter for closed fracture: Secondary | ICD-10-CM | POA: Diagnosis not present

## 2019-07-07 DIAGNOSIS — E039 Hypothyroidism, unspecified: Secondary | ICD-10-CM | POA: Diagnosis not present

## 2019-07-07 DIAGNOSIS — J9612 Chronic respiratory failure with hypercapnia: Secondary | ICD-10-CM | POA: Diagnosis not present

## 2019-07-07 DIAGNOSIS — M810 Age-related osteoporosis without current pathological fracture: Secondary | ICD-10-CM | POA: Diagnosis not present

## 2019-07-07 DIAGNOSIS — R634 Abnormal weight loss: Secondary | ICD-10-CM | POA: Diagnosis not present

## 2019-07-07 DIAGNOSIS — Z20828 Contact with and (suspected) exposure to other viral communicable diseases: Secondary | ICD-10-CM | POA: Diagnosis not present

## 2019-07-07 DIAGNOSIS — J9611 Chronic respiratory failure with hypoxia: Secondary | ICD-10-CM | POA: Diagnosis not present

## 2019-07-07 DIAGNOSIS — I110 Hypertensive heart disease with heart failure: Secondary | ICD-10-CM | POA: Diagnosis not present

## 2019-07-08 DIAGNOSIS — Z743 Need for continuous supervision: Secondary | ICD-10-CM | POA: Diagnosis not present

## 2019-07-08 DIAGNOSIS — S2249XA Multiple fractures of ribs, unspecified side, initial encounter for closed fracture: Secondary | ICD-10-CM | POA: Diagnosis not present

## 2019-07-08 DIAGNOSIS — Z79899 Other long term (current) drug therapy: Secondary | ICD-10-CM | POA: Diagnosis not present

## 2019-07-08 DIAGNOSIS — E039 Hypothyroidism, unspecified: Secondary | ICD-10-CM | POA: Diagnosis not present

## 2019-07-08 DIAGNOSIS — R4182 Altered mental status, unspecified: Secondary | ICD-10-CM | POA: Diagnosis not present

## 2019-07-08 DIAGNOSIS — E559 Vitamin D deficiency, unspecified: Secondary | ICD-10-CM | POA: Diagnosis not present

## 2019-07-08 DIAGNOSIS — E785 Hyperlipidemia, unspecified: Secondary | ICD-10-CM | POA: Diagnosis not present

## 2019-07-08 DIAGNOSIS — I959 Hypotension, unspecified: Secondary | ICD-10-CM | POA: Diagnosis not present

## 2019-07-08 DIAGNOSIS — R279 Unspecified lack of coordination: Secondary | ICD-10-CM | POA: Diagnosis not present

## 2019-07-08 DIAGNOSIS — N390 Urinary tract infection, site not specified: Secondary | ICD-10-CM | POA: Diagnosis not present

## 2019-07-08 DIAGNOSIS — R52 Pain, unspecified: Secondary | ICD-10-CM | POA: Diagnosis not present

## 2019-07-08 DIAGNOSIS — D649 Anemia, unspecified: Secondary | ICD-10-CM | POA: Diagnosis not present

## 2019-07-08 DIAGNOSIS — D509 Iron deficiency anemia, unspecified: Secondary | ICD-10-CM | POA: Diagnosis not present

## 2019-07-09 DIAGNOSIS — I129 Hypertensive chronic kidney disease with stage 1 through stage 4 chronic kidney disease, or unspecified chronic kidney disease: Secondary | ICD-10-CM | POA: Diagnosis not present

## 2019-07-09 DIAGNOSIS — E039 Hypothyroidism, unspecified: Secondary | ICD-10-CM | POA: Diagnosis not present

## 2019-07-09 DIAGNOSIS — M1A30X0 Chronic gout due to renal impairment, unspecified site, without tophus (tophi): Secondary | ICD-10-CM | POA: Diagnosis not present

## 2019-07-09 DIAGNOSIS — N400 Enlarged prostate without lower urinary tract symptoms: Secondary | ICD-10-CM | POA: Diagnosis not present

## 2019-07-09 DIAGNOSIS — R1310 Dysphagia, unspecified: Secondary | ICD-10-CM | POA: Diagnosis not present

## 2019-07-09 DIAGNOSIS — M25811 Other specified joint disorders, right shoulder: Secondary | ICD-10-CM | POA: Diagnosis not present

## 2019-07-09 DIAGNOSIS — E782 Mixed hyperlipidemia: Secondary | ICD-10-CM | POA: Diagnosis not present

## 2019-07-09 DIAGNOSIS — I69318 Other symptoms and signs involving cognitive functions following cerebral infarction: Secondary | ICD-10-CM | POA: Diagnosis not present

## 2019-07-09 DIAGNOSIS — D509 Iron deficiency anemia, unspecified: Secondary | ICD-10-CM | POA: Diagnosis not present

## 2019-07-09 DIAGNOSIS — E785 Hyperlipidemia, unspecified: Secondary | ICD-10-CM | POA: Diagnosis not present

## 2019-07-09 DIAGNOSIS — I10 Essential (primary) hypertension: Secondary | ICD-10-CM | POA: Diagnosis not present

## 2019-07-09 DIAGNOSIS — E1122 Type 2 diabetes mellitus with diabetic chronic kidney disease: Secondary | ICD-10-CM | POA: Diagnosis not present

## 2019-07-09 DIAGNOSIS — M109 Gout, unspecified: Secondary | ICD-10-CM | POA: Diagnosis not present

## 2019-07-09 DIAGNOSIS — J439 Emphysema, unspecified: Secondary | ICD-10-CM | POA: Diagnosis not present

## 2019-07-09 DIAGNOSIS — G4733 Obstructive sleep apnea (adult) (pediatric): Secondary | ICD-10-CM | POA: Diagnosis not present

## 2019-07-09 DIAGNOSIS — N390 Urinary tract infection, site not specified: Secondary | ICD-10-CM | POA: Diagnosis not present

## 2019-07-09 DIAGNOSIS — K219 Gastro-esophageal reflux disease without esophagitis: Secondary | ICD-10-CM | POA: Diagnosis not present

## 2019-07-09 DIAGNOSIS — I2510 Atherosclerotic heart disease of native coronary artery without angina pectoris: Secondary | ICD-10-CM | POA: Diagnosis not present

## 2019-07-09 DIAGNOSIS — M75101 Unspecified rotator cuff tear or rupture of right shoulder, not specified as traumatic: Secondary | ICD-10-CM | POA: Diagnosis not present

## 2019-07-09 DIAGNOSIS — S43431A Superior glenoid labrum lesion of right shoulder, initial encounter: Secondary | ICD-10-CM | POA: Diagnosis not present

## 2019-07-09 DIAGNOSIS — Z87891 Personal history of nicotine dependence: Secondary | ICD-10-CM | POA: Diagnosis not present

## 2019-07-09 DIAGNOSIS — N183 Chronic kidney disease, stage 3 (moderate): Secondary | ICD-10-CM | POA: Diagnosis not present

## 2019-07-09 DIAGNOSIS — E119 Type 2 diabetes mellitus without complications: Secondary | ICD-10-CM | POA: Diagnosis not present

## 2019-07-09 DIAGNOSIS — M7521 Bicipital tendinitis, right shoulder: Secondary | ICD-10-CM | POA: Diagnosis not present

## 2019-07-09 DIAGNOSIS — I350 Nonrheumatic aortic (valve) stenosis: Secondary | ICD-10-CM | POA: Diagnosis not present

## 2019-07-09 DIAGNOSIS — I69391 Dysphagia following cerebral infarction: Secondary | ICD-10-CM | POA: Diagnosis not present

## 2019-07-09 DIAGNOSIS — S2249XA Multiple fractures of ribs, unspecified side, initial encounter for closed fracture: Secondary | ICD-10-CM | POA: Diagnosis not present

## 2019-07-09 DIAGNOSIS — F0150 Vascular dementia without behavioral disturbance: Secondary | ICD-10-CM | POA: Diagnosis not present

## 2019-07-10 DIAGNOSIS — I10 Essential (primary) hypertension: Secondary | ICD-10-CM | POA: Diagnosis not present

## 2019-07-12 DIAGNOSIS — B9561 Methicillin susceptible Staphylococcus aureus infection as the cause of diseases classified elsewhere: Secondary | ICD-10-CM | POA: Diagnosis not present

## 2019-07-12 DIAGNOSIS — M4726 Other spondylosis with radiculopathy, lumbar region: Secondary | ICD-10-CM | POA: Diagnosis not present

## 2019-07-12 DIAGNOSIS — M4626 Osteomyelitis of vertebra, lumbar region: Secondary | ICD-10-CM | POA: Diagnosis not present

## 2019-07-12 DIAGNOSIS — Z452 Encounter for adjustment and management of vascular access device: Secondary | ICD-10-CM | POA: Diagnosis not present

## 2019-07-12 DIAGNOSIS — M19042 Primary osteoarthritis, left hand: Secondary | ICD-10-CM | POA: Diagnosis not present

## 2019-07-12 DIAGNOSIS — M48061 Spinal stenosis, lumbar region without neurogenic claudication: Secondary | ICD-10-CM | POA: Diagnosis not present

## 2019-07-12 DIAGNOSIS — E1169 Type 2 diabetes mellitus with other specified complication: Secondary | ICD-10-CM | POA: Diagnosis not present

## 2019-07-12 DIAGNOSIS — Z87891 Personal history of nicotine dependence: Secondary | ICD-10-CM | POA: Diagnosis not present

## 2019-07-12 DIAGNOSIS — I10 Essential (primary) hypertension: Secondary | ICD-10-CM | POA: Diagnosis not present

## 2019-07-12 DIAGNOSIS — M19041 Primary osteoarthritis, right hand: Secondary | ICD-10-CM | POA: Diagnosis not present

## 2019-07-12 DIAGNOSIS — E785 Hyperlipidemia, unspecified: Secondary | ICD-10-CM | POA: Diagnosis not present

## 2019-07-12 DIAGNOSIS — Z5181 Encounter for therapeutic drug level monitoring: Secondary | ICD-10-CM | POA: Diagnosis not present

## 2019-07-12 DIAGNOSIS — Z1231 Encounter for screening mammogram for malignant neoplasm of breast: Secondary | ICD-10-CM | POA: Diagnosis not present

## 2019-07-12 DIAGNOSIS — Z792 Long term (current) use of antibiotics: Secondary | ICD-10-CM | POA: Diagnosis not present

## 2019-07-12 DIAGNOSIS — K449 Diaphragmatic hernia without obstruction or gangrene: Secondary | ICD-10-CM | POA: Diagnosis not present

## 2019-07-12 DIAGNOSIS — K5730 Diverticulosis of large intestine without perforation or abscess without bleeding: Secondary | ICD-10-CM | POA: Diagnosis not present

## 2019-07-13 DIAGNOSIS — M1711 Unilateral primary osteoarthritis, right knee: Secondary | ICD-10-CM | POA: Diagnosis not present

## 2019-07-13 DIAGNOSIS — Z96651 Presence of right artificial knee joint: Secondary | ICD-10-CM | POA: Diagnosis not present

## 2019-07-14 DIAGNOSIS — L98412 Non-pressure chronic ulcer of buttock with fat layer exposed: Secondary | ICD-10-CM | POA: Diagnosis not present

## 2019-07-14 DIAGNOSIS — I482 Chronic atrial fibrillation, unspecified: Secondary | ICD-10-CM | POA: Diagnosis not present

## 2019-07-14 DIAGNOSIS — D631 Anemia in chronic kidney disease: Secondary | ICD-10-CM | POA: Diagnosis not present

## 2019-07-14 DIAGNOSIS — E039 Hypothyroidism, unspecified: Secondary | ICD-10-CM | POA: Diagnosis not present

## 2019-07-14 DIAGNOSIS — E669 Obesity, unspecified: Secondary | ICD-10-CM | POA: Diagnosis not present

## 2019-07-14 DIAGNOSIS — I69391 Dysphagia following cerebral infarction: Secondary | ICD-10-CM | POA: Diagnosis not present

## 2019-07-14 DIAGNOSIS — Z794 Long term (current) use of insulin: Secondary | ICD-10-CM | POA: Diagnosis not present

## 2019-07-14 DIAGNOSIS — E8809 Other disorders of plasma-protein metabolism, not elsewhere classified: Secondary | ICD-10-CM | POA: Diagnosis not present

## 2019-07-14 DIAGNOSIS — M5030 Other cervical disc degeneration, unspecified cervical region: Secondary | ICD-10-CM | POA: Diagnosis not present

## 2019-07-14 DIAGNOSIS — J439 Emphysema, unspecified: Secondary | ICD-10-CM | POA: Diagnosis not present

## 2019-07-14 DIAGNOSIS — E785 Hyperlipidemia, unspecified: Secondary | ICD-10-CM | POA: Diagnosis not present

## 2019-07-14 DIAGNOSIS — K5900 Constipation, unspecified: Secondary | ICD-10-CM | POA: Diagnosis not present

## 2019-07-14 DIAGNOSIS — N183 Chronic kidney disease, stage 3 (moderate): Secondary | ICD-10-CM | POA: Diagnosis not present

## 2019-07-14 DIAGNOSIS — L0231 Cutaneous abscess of buttock: Secondary | ICD-10-CM | POA: Diagnosis not present

## 2019-07-14 DIAGNOSIS — M10061 Idiopathic gout, right knee: Secondary | ICD-10-CM | POA: Diagnosis not present

## 2019-07-14 DIAGNOSIS — E43 Unspecified severe protein-calorie malnutrition: Secondary | ICD-10-CM | POA: Diagnosis not present

## 2019-07-14 DIAGNOSIS — E1122 Type 2 diabetes mellitus with diabetic chronic kidney disease: Secondary | ICD-10-CM | POA: Diagnosis not present

## 2019-07-14 DIAGNOSIS — M1A30X0 Chronic gout due to renal impairment, unspecified site, without tophus (tophi): Secondary | ICD-10-CM | POA: Diagnosis not present

## 2019-07-14 DIAGNOSIS — N400 Enlarged prostate without lower urinary tract symptoms: Secondary | ICD-10-CM | POA: Diagnosis not present

## 2019-07-14 DIAGNOSIS — K219 Gastro-esophageal reflux disease without esophagitis: Secondary | ICD-10-CM | POA: Diagnosis not present

## 2019-07-14 DIAGNOSIS — R1310 Dysphagia, unspecified: Secondary | ICD-10-CM | POA: Diagnosis not present

## 2019-07-14 DIAGNOSIS — I69318 Other symptoms and signs involving cognitive functions following cerebral infarction: Secondary | ICD-10-CM | POA: Diagnosis not present

## 2019-07-14 DIAGNOSIS — N184 Chronic kidney disease, stage 4 (severe): Secondary | ICD-10-CM | POA: Diagnosis not present

## 2019-07-14 DIAGNOSIS — I129 Hypertensive chronic kidney disease with stage 1 through stage 4 chronic kidney disease, or unspecified chronic kidney disease: Secondary | ICD-10-CM | POA: Diagnosis not present

## 2019-07-14 DIAGNOSIS — I350 Nonrheumatic aortic (valve) stenosis: Secondary | ICD-10-CM | POA: Diagnosis not present

## 2019-07-14 DIAGNOSIS — F0150 Vascular dementia without behavioral disturbance: Secondary | ICD-10-CM | POA: Diagnosis not present

## 2019-07-14 DIAGNOSIS — I2510 Atherosclerotic heart disease of native coronary artery without angina pectoris: Secondary | ICD-10-CM | POA: Diagnosis not present

## 2019-07-15 DIAGNOSIS — H18413 Arcus senilis, bilateral: Secondary | ICD-10-CM | POA: Diagnosis not present

## 2019-07-15 DIAGNOSIS — Z961 Presence of intraocular lens: Secondary | ICD-10-CM | POA: Diagnosis not present

## 2019-07-15 DIAGNOSIS — I712 Thoracic aortic aneurysm, without rupture: Secondary | ICD-10-CM | POA: Diagnosis not present

## 2019-07-15 DIAGNOSIS — R531 Weakness: Secondary | ICD-10-CM | POA: Diagnosis not present

## 2019-07-15 DIAGNOSIS — M62838 Other muscle spasm: Secondary | ICD-10-CM | POA: Diagnosis not present

## 2019-07-15 DIAGNOSIS — H26491 Other secondary cataract, right eye: Secondary | ICD-10-CM | POA: Diagnosis not present

## 2019-07-15 DIAGNOSIS — H02831 Dermatochalasis of right upper eyelid: Secondary | ICD-10-CM | POA: Diagnosis not present

## 2019-07-16 DIAGNOSIS — Z1159 Encounter for screening for other viral diseases: Secondary | ICD-10-CM | POA: Diagnosis not present

## 2019-07-16 DIAGNOSIS — F411 Generalized anxiety disorder: Secondary | ICD-10-CM | POA: Diagnosis not present

## 2019-07-16 DIAGNOSIS — M47817 Spondylosis without myelopathy or radiculopathy, lumbosacral region: Secondary | ICD-10-CM | POA: Diagnosis not present

## 2019-07-16 DIAGNOSIS — M47812 Spondylosis without myelopathy or radiculopathy, cervical region: Secondary | ICD-10-CM | POA: Diagnosis not present

## 2019-07-16 DIAGNOSIS — M542 Cervicalgia: Secondary | ICD-10-CM | POA: Diagnosis not present

## 2019-07-16 DIAGNOSIS — J952 Acute pulmonary insufficiency following nonthoracic surgery: Secondary | ICD-10-CM | POA: Diagnosis not present

## 2019-07-16 DIAGNOSIS — M961 Postlaminectomy syndrome, not elsewhere classified: Secondary | ICD-10-CM | POA: Diagnosis not present

## 2019-07-16 DIAGNOSIS — I714 Abdominal aortic aneurysm, without rupture: Secondary | ICD-10-CM | POA: Diagnosis not present

## 2019-07-16 DIAGNOSIS — F331 Major depressive disorder, recurrent, moderate: Secondary | ICD-10-CM | POA: Diagnosis not present

## 2019-07-16 DIAGNOSIS — M47816 Spondylosis without myelopathy or radiculopathy, lumbar region: Secondary | ICD-10-CM | POA: Diagnosis not present

## 2019-07-17 DIAGNOSIS — R4182 Altered mental status, unspecified: Secondary | ICD-10-CM | POA: Diagnosis not present

## 2019-07-17 DIAGNOSIS — R530 Neoplastic (malignant) related fatigue: Secondary | ICD-10-CM | POA: Diagnosis not present

## 2019-07-17 DIAGNOSIS — C3492 Malignant neoplasm of unspecified part of left bronchus or lung: Secondary | ICD-10-CM | POA: Diagnosis not present

## 2019-07-17 DIAGNOSIS — S22080S Wedge compression fracture of T11-T12 vertebra, sequela: Secondary | ICD-10-CM | POA: Diagnosis not present

## 2019-07-18 DIAGNOSIS — K219 Gastro-esophageal reflux disease without esophagitis: Secondary | ICD-10-CM | POA: Diagnosis not present

## 2019-07-18 DIAGNOSIS — F329 Major depressive disorder, single episode, unspecified: Secondary | ICD-10-CM | POA: Diagnosis not present

## 2019-07-18 DIAGNOSIS — N401 Enlarged prostate with lower urinary tract symptoms: Secondary | ICD-10-CM | POA: Diagnosis not present

## 2019-07-18 DIAGNOSIS — E43 Unspecified severe protein-calorie malnutrition: Secondary | ICD-10-CM | POA: Diagnosis not present

## 2019-07-18 DIAGNOSIS — B9620 Unspecified Escherichia coli [E. coli] as the cause of diseases classified elsewhere: Secondary | ICD-10-CM | POA: Diagnosis not present

## 2019-07-18 DIAGNOSIS — R338 Other retention of urine: Secondary | ICD-10-CM | POA: Diagnosis not present

## 2019-07-18 DIAGNOSIS — N390 Urinary tract infection, site not specified: Secondary | ICD-10-CM | POA: Diagnosis not present

## 2019-07-18 DIAGNOSIS — L89152 Pressure ulcer of sacral region, stage 2: Secondary | ICD-10-CM | POA: Diagnosis not present

## 2019-07-18 DIAGNOSIS — J449 Chronic obstructive pulmonary disease, unspecified: Secondary | ICD-10-CM | POA: Diagnosis not present

## 2019-07-18 DIAGNOSIS — E785 Hyperlipidemia, unspecified: Secondary | ICD-10-CM | POA: Diagnosis not present

## 2019-07-18 DIAGNOSIS — I714 Abdominal aortic aneurysm, without rupture: Secondary | ICD-10-CM | POA: Diagnosis not present

## 2019-07-18 DIAGNOSIS — F0390 Unspecified dementia without behavioral disturbance: Secondary | ICD-10-CM | POA: Diagnosis not present

## 2019-07-18 DIAGNOSIS — D638 Anemia in other chronic diseases classified elsewhere: Secondary | ICD-10-CM | POA: Diagnosis not present

## 2019-07-19 DIAGNOSIS — E785 Hyperlipidemia, unspecified: Secondary | ICD-10-CM | POA: Diagnosis not present

## 2019-07-19 DIAGNOSIS — E7800 Pure hypercholesterolemia, unspecified: Secondary | ICD-10-CM | POA: Diagnosis not present

## 2019-07-19 DIAGNOSIS — Z792 Long term (current) use of antibiotics: Secondary | ICD-10-CM | POA: Diagnosis not present

## 2019-07-19 DIAGNOSIS — M19041 Primary osteoarthritis, right hand: Secondary | ICD-10-CM | POA: Diagnosis not present

## 2019-07-19 DIAGNOSIS — Z136 Encounter for screening for cardiovascular disorders: Secondary | ICD-10-CM | POA: Diagnosis not present

## 2019-07-19 DIAGNOSIS — Z452 Encounter for adjustment and management of vascular access device: Secondary | ICD-10-CM | POA: Diagnosis not present

## 2019-07-19 DIAGNOSIS — R7303 Prediabetes: Secondary | ICD-10-CM | POA: Diagnosis not present

## 2019-07-19 DIAGNOSIS — M810 Age-related osteoporosis without current pathological fracture: Secondary | ICD-10-CM | POA: Diagnosis not present

## 2019-07-19 DIAGNOSIS — H903 Sensorineural hearing loss, bilateral: Secondary | ICD-10-CM | POA: Diagnosis not present

## 2019-07-19 DIAGNOSIS — Z79899 Other long term (current) drug therapy: Secondary | ICD-10-CM | POA: Diagnosis not present

## 2019-07-19 DIAGNOSIS — M48061 Spinal stenosis, lumbar region without neurogenic claudication: Secondary | ICD-10-CM | POA: Diagnosis not present

## 2019-07-19 DIAGNOSIS — I10 Essential (primary) hypertension: Secondary | ICD-10-CM | POA: Diagnosis not present

## 2019-07-19 DIAGNOSIS — D649 Anemia, unspecified: Secondary | ICD-10-CM | POA: Diagnosis not present

## 2019-07-19 DIAGNOSIS — M19042 Primary osteoarthritis, left hand: Secondary | ICD-10-CM | POA: Diagnosis not present

## 2019-07-19 DIAGNOSIS — E1169 Type 2 diabetes mellitus with other specified complication: Secondary | ICD-10-CM | POA: Diagnosis not present

## 2019-07-19 DIAGNOSIS — B9561 Methicillin susceptible Staphylococcus aureus infection as the cause of diseases classified elsewhere: Secondary | ICD-10-CM | POA: Diagnosis not present

## 2019-07-19 DIAGNOSIS — Z5181 Encounter for therapeutic drug level monitoring: Secondary | ICD-10-CM | POA: Diagnosis not present

## 2019-07-19 DIAGNOSIS — Z1159 Encounter for screening for other viral diseases: Secondary | ICD-10-CM | POA: Diagnosis not present

## 2019-07-19 DIAGNOSIS — Z87891 Personal history of nicotine dependence: Secondary | ICD-10-CM | POA: Diagnosis not present

## 2019-07-19 DIAGNOSIS — M4626 Osteomyelitis of vertebra, lumbar region: Secondary | ICD-10-CM | POA: Diagnosis not present

## 2019-07-19 DIAGNOSIS — M4726 Other spondylosis with radiculopathy, lumbar region: Secondary | ICD-10-CM | POA: Diagnosis not present

## 2019-07-20 DIAGNOSIS — S32010A Wedge compression fracture of first lumbar vertebra, initial encounter for closed fracture: Secondary | ICD-10-CM | POA: Diagnosis not present

## 2019-07-20 DIAGNOSIS — M1A30X0 Chronic gout due to renal impairment, unspecified site, without tophus (tophi): Secondary | ICD-10-CM | POA: Diagnosis not present

## 2019-07-20 DIAGNOSIS — F17210 Nicotine dependence, cigarettes, uncomplicated: Secondary | ICD-10-CM | POA: Diagnosis not present

## 2019-07-20 DIAGNOSIS — M6281 Muscle weakness (generalized): Secondary | ICD-10-CM | POA: Diagnosis not present

## 2019-07-20 DIAGNOSIS — S7001XA Contusion of right hip, initial encounter: Secondary | ICD-10-CM | POA: Diagnosis not present

## 2019-07-20 DIAGNOSIS — Z4800 Encounter for change or removal of nonsurgical wound dressing: Secondary | ICD-10-CM | POA: Diagnosis not present

## 2019-07-20 DIAGNOSIS — I2510 Atherosclerotic heart disease of native coronary artery without angina pectoris: Secondary | ICD-10-CM | POA: Diagnosis not present

## 2019-07-20 DIAGNOSIS — I2699 Other pulmonary embolism without acute cor pulmonale: Secondary | ICD-10-CM | POA: Diagnosis not present

## 2019-07-20 DIAGNOSIS — Z87891 Personal history of nicotine dependence: Secondary | ICD-10-CM | POA: Diagnosis not present

## 2019-07-20 DIAGNOSIS — S32401D Unspecified fracture of right acetabulum, subsequent encounter for fracture with routine healing: Secondary | ICD-10-CM | POA: Diagnosis not present

## 2019-07-20 DIAGNOSIS — Z7901 Long term (current) use of anticoagulants: Secondary | ICD-10-CM | POA: Diagnosis not present

## 2019-07-20 DIAGNOSIS — I69391 Dysphagia following cerebral infarction: Secondary | ICD-10-CM | POA: Diagnosis not present

## 2019-07-20 DIAGNOSIS — I4891 Unspecified atrial fibrillation: Secondary | ICD-10-CM | POA: Diagnosis not present

## 2019-07-20 DIAGNOSIS — R41841 Cognitive communication deficit: Secondary | ICD-10-CM | POA: Diagnosis not present

## 2019-07-20 DIAGNOSIS — I119 Hypertensive heart disease without heart failure: Secondary | ICD-10-CM | POA: Diagnosis not present

## 2019-07-20 DIAGNOSIS — Z7984 Long term (current) use of oral hypoglycemic drugs: Secondary | ICD-10-CM | POA: Diagnosis not present

## 2019-07-20 DIAGNOSIS — R278 Other lack of coordination: Secondary | ICD-10-CM | POA: Diagnosis not present

## 2019-07-20 DIAGNOSIS — S22080A Wedge compression fracture of T11-T12 vertebra, initial encounter for closed fracture: Secondary | ICD-10-CM | POA: Diagnosis not present

## 2019-07-20 DIAGNOSIS — E119 Type 2 diabetes mellitus without complications: Secondary | ICD-10-CM | POA: Diagnosis not present

## 2019-07-20 DIAGNOSIS — F419 Anxiety disorder, unspecified: Secondary | ICD-10-CM | POA: Diagnosis not present

## 2019-07-20 DIAGNOSIS — C3490 Malignant neoplasm of unspecified part of unspecified bronchus or lung: Secondary | ICD-10-CM | POA: Diagnosis not present

## 2019-07-20 DIAGNOSIS — I25110 Atherosclerotic heart disease of native coronary artery with unstable angina pectoris: Secondary | ICD-10-CM | POA: Diagnosis not present

## 2019-07-20 DIAGNOSIS — N400 Enlarged prostate without lower urinary tract symptoms: Secondary | ICD-10-CM | POA: Diagnosis not present

## 2019-07-20 DIAGNOSIS — S22089D Unspecified fracture of T11-T12 vertebra, subsequent encounter for fracture with routine healing: Secondary | ICD-10-CM | POA: Diagnosis not present

## 2019-07-20 DIAGNOSIS — S32020A Wedge compression fracture of second lumbar vertebra, initial encounter for closed fracture: Secondary | ICD-10-CM | POA: Diagnosis not present

## 2019-07-20 DIAGNOSIS — E785 Hyperlipidemia, unspecified: Secondary | ICD-10-CM | POA: Diagnosis not present

## 2019-07-20 DIAGNOSIS — N183 Chronic kidney disease, stage 3 (moderate): Secondary | ICD-10-CM | POA: Diagnosis not present

## 2019-07-20 DIAGNOSIS — F329 Major depressive disorder, single episode, unspecified: Secondary | ICD-10-CM | POA: Diagnosis not present

## 2019-07-20 DIAGNOSIS — E1122 Type 2 diabetes mellitus with diabetic chronic kidney disease: Secondary | ICD-10-CM | POA: Diagnosis not present

## 2019-07-20 DIAGNOSIS — C7951 Secondary malignant neoplasm of bone: Secondary | ICD-10-CM | POA: Diagnosis not present

## 2019-07-20 DIAGNOSIS — I350 Nonrheumatic aortic (valve) stenosis: Secondary | ICD-10-CM | POA: Diagnosis not present

## 2019-07-20 DIAGNOSIS — R1310 Dysphagia, unspecified: Secondary | ICD-10-CM | POA: Diagnosis not present

## 2019-07-20 DIAGNOSIS — I69318 Other symptoms and signs involving cognitive functions following cerebral infarction: Secondary | ICD-10-CM | POA: Diagnosis not present

## 2019-07-20 DIAGNOSIS — J439 Emphysema, unspecified: Secondary | ICD-10-CM | POA: Diagnosis not present

## 2019-07-20 DIAGNOSIS — F0150 Vascular dementia without behavioral disturbance: Secondary | ICD-10-CM | POA: Diagnosis not present

## 2019-07-20 DIAGNOSIS — F0390 Unspecified dementia without behavioral disturbance: Secondary | ICD-10-CM | POA: Diagnosis not present

## 2019-07-20 DIAGNOSIS — I129 Hypertensive chronic kidney disease with stage 1 through stage 4 chronic kidney disease, or unspecified chronic kidney disease: Secondary | ICD-10-CM | POA: Diagnosis not present

## 2019-07-20 DIAGNOSIS — Z79899 Other long term (current) drug therapy: Secondary | ICD-10-CM | POA: Diagnosis not present

## 2019-07-20 DIAGNOSIS — K219 Gastro-esophageal reflux disease without esophagitis: Secondary | ICD-10-CM | POA: Diagnosis not present

## 2019-07-20 DIAGNOSIS — E079 Disorder of thyroid, unspecified: Secondary | ICD-10-CM | POA: Diagnosis not present

## 2019-07-21 DIAGNOSIS — E7800 Pure hypercholesterolemia, unspecified: Secondary | ICD-10-CM | POA: Diagnosis not present

## 2019-07-21 DIAGNOSIS — R351 Nocturia: Secondary | ICD-10-CM | POA: Diagnosis not present

## 2019-07-21 DIAGNOSIS — Z471 Aftercare following joint replacement surgery: Secondary | ICD-10-CM | POA: Diagnosis not present

## 2019-07-21 DIAGNOSIS — E781 Pure hyperglyceridemia: Secondary | ICD-10-CM | POA: Diagnosis not present

## 2019-07-21 DIAGNOSIS — M5441 Lumbago with sciatica, right side: Secondary | ICD-10-CM | POA: Diagnosis not present

## 2019-07-21 DIAGNOSIS — G8929 Other chronic pain: Secondary | ICD-10-CM | POA: Diagnosis not present

## 2019-07-21 DIAGNOSIS — E559 Vitamin D deficiency, unspecified: Secondary | ICD-10-CM | POA: Diagnosis not present

## 2019-07-21 DIAGNOSIS — D509 Iron deficiency anemia, unspecified: Secondary | ICD-10-CM | POA: Diagnosis not present

## 2019-07-21 DIAGNOSIS — I714 Abdominal aortic aneurysm, without rupture: Secondary | ICD-10-CM | POA: Diagnosis not present

## 2019-07-21 DIAGNOSIS — E785 Hyperlipidemia, unspecified: Secondary | ICD-10-CM | POA: Diagnosis not present

## 2019-07-21 DIAGNOSIS — K279 Peptic ulcer, site unspecified, unspecified as acute or chronic, without hemorrhage or perforation: Secondary | ICD-10-CM | POA: Diagnosis not present

## 2019-07-21 DIAGNOSIS — N401 Enlarged prostate with lower urinary tract symptoms: Secondary | ICD-10-CM | POA: Diagnosis not present

## 2019-07-21 DIAGNOSIS — I480 Paroxysmal atrial fibrillation: Secondary | ICD-10-CM | POA: Diagnosis not present

## 2019-07-21 DIAGNOSIS — J952 Acute pulmonary insufficiency following nonthoracic surgery: Secondary | ICD-10-CM | POA: Diagnosis not present

## 2019-07-21 DIAGNOSIS — I119 Hypertensive heart disease without heart failure: Secondary | ICD-10-CM | POA: Diagnosis not present

## 2019-07-21 DIAGNOSIS — I2510 Atherosclerotic heart disease of native coronary artery without angina pectoris: Secondary | ICD-10-CM | POA: Diagnosis not present

## 2019-07-21 DIAGNOSIS — M5442 Lumbago with sciatica, left side: Secondary | ICD-10-CM | POA: Diagnosis not present

## 2019-07-21 DIAGNOSIS — M109 Gout, unspecified: Secondary | ICD-10-CM | POA: Diagnosis not present

## 2019-07-22 DIAGNOSIS — R52 Pain, unspecified: Secondary | ICD-10-CM | POA: Diagnosis not present

## 2019-07-22 DIAGNOSIS — M545 Low back pain: Secondary | ICD-10-CM | POA: Diagnosis not present

## 2019-07-22 DIAGNOSIS — R279 Unspecified lack of coordination: Secondary | ICD-10-CM | POA: Diagnosis not present

## 2019-07-22 DIAGNOSIS — Z743 Need for continuous supervision: Secondary | ICD-10-CM | POA: Diagnosis not present

## 2019-07-22 DIAGNOSIS — F331 Major depressive disorder, recurrent, moderate: Secondary | ICD-10-CM | POA: Diagnosis not present

## 2019-07-22 DIAGNOSIS — Z79891 Long term (current) use of opiate analgesic: Secondary | ICD-10-CM | POA: Diagnosis not present

## 2019-07-22 DIAGNOSIS — F411 Generalized anxiety disorder: Secondary | ICD-10-CM | POA: Diagnosis not present

## 2019-07-22 DIAGNOSIS — R410 Disorientation, unspecified: Secondary | ICD-10-CM | POA: Diagnosis not present

## 2019-07-23 DIAGNOSIS — R6889 Other general symptoms and signs: Secondary | ICD-10-CM | POA: Diagnosis not present

## 2019-07-23 DIAGNOSIS — N200 Calculus of kidney: Secondary | ICD-10-CM | POA: Diagnosis not present

## 2019-07-23 DIAGNOSIS — G20 Parkinson's disease: Secondary | ICD-10-CM | POA: Diagnosis not present

## 2019-07-23 DIAGNOSIS — Z8673 Personal history of transient ischemic attack (TIA), and cerebral infarction without residual deficits: Secondary | ICD-10-CM | POA: Diagnosis not present

## 2019-07-25 DIAGNOSIS — D510 Vitamin B12 deficiency anemia due to intrinsic factor deficiency: Secondary | ICD-10-CM | POA: Diagnosis not present

## 2019-07-25 DIAGNOSIS — E039 Hypothyroidism, unspecified: Secondary | ICD-10-CM | POA: Diagnosis not present

## 2019-07-25 DIAGNOSIS — K449 Diaphragmatic hernia without obstruction or gangrene: Secondary | ICD-10-CM | POA: Diagnosis not present

## 2019-07-25 DIAGNOSIS — K589 Irritable bowel syndrome without diarrhea: Secondary | ICD-10-CM | POA: Diagnosis not present

## 2019-07-25 DIAGNOSIS — D692 Other nonthrombocytopenic purpura: Secondary | ICD-10-CM | POA: Diagnosis not present

## 2019-07-25 DIAGNOSIS — Z789 Other specified health status: Secondary | ICD-10-CM | POA: Diagnosis not present

## 2019-07-25 DIAGNOSIS — N390 Urinary tract infection, site not specified: Secondary | ICD-10-CM | POA: Diagnosis not present

## 2019-07-25 DIAGNOSIS — I4891 Unspecified atrial fibrillation: Secondary | ICD-10-CM | POA: Diagnosis not present

## 2019-07-25 DIAGNOSIS — N2889 Other specified disorders of kidney and ureter: Secondary | ICD-10-CM | POA: Diagnosis not present

## 2019-07-25 DIAGNOSIS — K2980 Duodenitis without bleeding: Secondary | ICD-10-CM | POA: Diagnosis not present

## 2019-07-25 DIAGNOSIS — G8929 Other chronic pain: Secondary | ICD-10-CM | POA: Diagnosis not present

## 2019-07-25 DIAGNOSIS — I10 Essential (primary) hypertension: Secondary | ICD-10-CM | POA: Diagnosis not present

## 2019-07-25 DIAGNOSIS — F0280 Dementia in other diseases classified elsewhere without behavioral disturbance: Secondary | ICD-10-CM | POA: Diagnosis not present

## 2019-07-25 DIAGNOSIS — E1122 Type 2 diabetes mellitus with diabetic chronic kidney disease: Secondary | ICD-10-CM | POA: Diagnosis not present

## 2019-07-25 DIAGNOSIS — M1990 Unspecified osteoarthritis, unspecified site: Secondary | ICD-10-CM | POA: Diagnosis not present

## 2019-07-25 DIAGNOSIS — N183 Chronic kidney disease, stage 3 (moderate): Secondary | ICD-10-CM | POA: Diagnosis not present

## 2019-07-25 DIAGNOSIS — K5720 Diverticulitis of large intestine with perforation and abscess without bleeding: Secondary | ICD-10-CM | POA: Diagnosis not present

## 2019-07-26 DIAGNOSIS — M19072 Primary osteoarthritis, left ankle and foot: Secondary | ICD-10-CM | POA: Diagnosis not present

## 2019-07-26 DIAGNOSIS — Z789 Other specified health status: Secondary | ICD-10-CM | POA: Diagnosis not present

## 2019-07-26 DIAGNOSIS — I4891 Unspecified atrial fibrillation: Secondary | ICD-10-CM | POA: Diagnosis not present

## 2019-07-26 DIAGNOSIS — M79675 Pain in left toe(s): Secondary | ICD-10-CM | POA: Diagnosis not present

## 2019-07-26 DIAGNOSIS — M1990 Unspecified osteoarthritis, unspecified site: Secondary | ICD-10-CM | POA: Diagnosis not present

## 2019-07-26 DIAGNOSIS — N2889 Other specified disorders of kidney and ureter: Secondary | ICD-10-CM | POA: Diagnosis not present

## 2019-07-26 DIAGNOSIS — N390 Urinary tract infection, site not specified: Secondary | ICD-10-CM | POA: Diagnosis not present

## 2019-07-27 DIAGNOSIS — Z79899 Other long term (current) drug therapy: Secondary | ICD-10-CM | POA: Diagnosis not present

## 2019-07-27 DIAGNOSIS — N289 Disorder of kidney and ureter, unspecified: Secondary | ICD-10-CM | POA: Diagnosis not present

## 2019-07-27 DIAGNOSIS — K279 Peptic ulcer, site unspecified, unspecified as acute or chronic, without hemorrhage or perforation: Secondary | ICD-10-CM | POA: Diagnosis not present

## 2019-07-27 DIAGNOSIS — Z5181 Encounter for therapeutic drug level monitoring: Secondary | ICD-10-CM | POA: Diagnosis not present

## 2019-07-27 DIAGNOSIS — M4626 Osteomyelitis of vertebra, lumbar region: Secondary | ICD-10-CM | POA: Diagnosis not present

## 2019-07-27 DIAGNOSIS — E876 Hypokalemia: Secondary | ICD-10-CM | POA: Diagnosis not present

## 2019-07-27 DIAGNOSIS — N390 Urinary tract infection, site not specified: Secondary | ICD-10-CM | POA: Diagnosis not present

## 2019-07-27 DIAGNOSIS — J9601 Acute respiratory failure with hypoxia: Secondary | ICD-10-CM | POA: Diagnosis not present

## 2019-07-27 DIAGNOSIS — M542 Cervicalgia: Secondary | ICD-10-CM | POA: Diagnosis not present

## 2019-07-27 DIAGNOSIS — M48061 Spinal stenosis, lumbar region without neurogenic claudication: Secondary | ICD-10-CM | POA: Diagnosis not present

## 2019-07-27 DIAGNOSIS — Z792 Long term (current) use of antibiotics: Secondary | ICD-10-CM | POA: Diagnosis not present

## 2019-07-27 DIAGNOSIS — G894 Chronic pain syndrome: Secondary | ICD-10-CM | POA: Diagnosis not present

## 2019-07-27 DIAGNOSIS — I4891 Unspecified atrial fibrillation: Secondary | ICD-10-CM | POA: Diagnosis not present

## 2019-07-27 DIAGNOSIS — D509 Iron deficiency anemia, unspecified: Secondary | ICD-10-CM | POA: Diagnosis not present

## 2019-07-27 DIAGNOSIS — I480 Paroxysmal atrial fibrillation: Secondary | ICD-10-CM | POA: Diagnosis not present

## 2019-07-27 DIAGNOSIS — M545 Low back pain: Secondary | ICD-10-CM | POA: Diagnosis not present

## 2019-07-27 DIAGNOSIS — E7800 Pure hypercholesterolemia, unspecified: Secondary | ICD-10-CM | POA: Diagnosis not present

## 2019-07-27 DIAGNOSIS — I119 Hypertensive heart disease without heart failure: Secondary | ICD-10-CM | POA: Diagnosis not present

## 2019-07-27 DIAGNOSIS — Z789 Other specified health status: Secondary | ICD-10-CM | POA: Diagnosis not present

## 2019-07-27 DIAGNOSIS — N401 Enlarged prostate with lower urinary tract symptoms: Secondary | ICD-10-CM | POA: Diagnosis not present

## 2019-07-27 DIAGNOSIS — Z87891 Personal history of nicotine dependence: Secondary | ICD-10-CM | POA: Diagnosis not present

## 2019-07-27 DIAGNOSIS — R351 Nocturia: Secondary | ICD-10-CM | POA: Diagnosis not present

## 2019-07-27 DIAGNOSIS — M4726 Other spondylosis with radiculopathy, lumbar region: Secondary | ICD-10-CM | POA: Diagnosis not present

## 2019-07-27 DIAGNOSIS — M129 Arthropathy, unspecified: Secondary | ICD-10-CM | POA: Diagnosis not present

## 2019-07-27 DIAGNOSIS — M19042 Primary osteoarthritis, left hand: Secondary | ICD-10-CM | POA: Diagnosis not present

## 2019-07-27 DIAGNOSIS — E1169 Type 2 diabetes mellitus with other specified complication: Secondary | ICD-10-CM | POA: Diagnosis not present

## 2019-07-27 DIAGNOSIS — E063 Autoimmune thyroiditis: Secondary | ICD-10-CM | POA: Diagnosis not present

## 2019-07-27 DIAGNOSIS — M109 Gout, unspecified: Secondary | ICD-10-CM | POA: Diagnosis not present

## 2019-07-27 DIAGNOSIS — Z452 Encounter for adjustment and management of vascular access device: Secondary | ICD-10-CM | POA: Diagnosis not present

## 2019-07-27 DIAGNOSIS — E559 Vitamin D deficiency, unspecified: Secondary | ICD-10-CM | POA: Diagnosis not present

## 2019-07-27 DIAGNOSIS — E781 Pure hyperglyceridemia: Secondary | ICD-10-CM | POA: Diagnosis not present

## 2019-07-27 DIAGNOSIS — B9561 Methicillin susceptible Staphylococcus aureus infection as the cause of diseases classified elsewhere: Secondary | ICD-10-CM | POA: Diagnosis not present

## 2019-07-27 DIAGNOSIS — I10 Essential (primary) hypertension: Secondary | ICD-10-CM | POA: Diagnosis not present

## 2019-07-27 DIAGNOSIS — U071 COVID-19: Secondary | ICD-10-CM | POA: Diagnosis not present

## 2019-07-27 DIAGNOSIS — G8929 Other chronic pain: Secondary | ICD-10-CM | POA: Diagnosis not present

## 2019-07-27 DIAGNOSIS — M19041 Primary osteoarthritis, right hand: Secondary | ICD-10-CM | POA: Diagnosis not present

## 2019-07-27 DIAGNOSIS — E871 Hypo-osmolality and hyponatremia: Secondary | ICD-10-CM | POA: Diagnosis not present

## 2019-07-27 DIAGNOSIS — Z1159 Encounter for screening for other viral diseases: Secondary | ICD-10-CM | POA: Diagnosis not present

## 2019-07-27 DIAGNOSIS — R809 Proteinuria, unspecified: Secondary | ICD-10-CM | POA: Diagnosis not present

## 2019-07-27 DIAGNOSIS — I2510 Atherosclerotic heart disease of native coronary artery without angina pectoris: Secondary | ICD-10-CM | POA: Diagnosis not present

## 2019-07-27 DIAGNOSIS — N2889 Other specified disorders of kidney and ureter: Secondary | ICD-10-CM | POA: Diagnosis not present

## 2019-07-27 DIAGNOSIS — E785 Hyperlipidemia, unspecified: Secondary | ICD-10-CM | POA: Diagnosis not present

## 2019-07-27 DIAGNOSIS — Z471 Aftercare following joint replacement surgery: Secondary | ICD-10-CM | POA: Diagnosis not present

## 2019-07-28 DIAGNOSIS — E876 Hypokalemia: Secondary | ICD-10-CM | POA: Diagnosis not present

## 2019-07-28 DIAGNOSIS — Z720 Tobacco use: Secondary | ICD-10-CM | POA: Diagnosis not present

## 2019-07-28 DIAGNOSIS — E119 Type 2 diabetes mellitus without complications: Secondary | ICD-10-CM | POA: Diagnosis not present

## 2019-07-28 DIAGNOSIS — Z789 Other specified health status: Secondary | ICD-10-CM | POA: Diagnosis not present

## 2019-07-28 DIAGNOSIS — K529 Noninfective gastroenteritis and colitis, unspecified: Secondary | ICD-10-CM | POA: Diagnosis not present

## 2019-07-28 DIAGNOSIS — C61 Malignant neoplasm of prostate: Secondary | ICD-10-CM | POA: Diagnosis not present

## 2019-07-28 DIAGNOSIS — N289 Disorder of kidney and ureter, unspecified: Secondary | ICD-10-CM | POA: Diagnosis not present

## 2019-07-28 DIAGNOSIS — F0281 Dementia in other diseases classified elsewhere with behavioral disturbance: Secondary | ICD-10-CM | POA: Diagnosis not present

## 2019-07-28 DIAGNOSIS — E7800 Pure hypercholesterolemia, unspecified: Secondary | ICD-10-CM | POA: Diagnosis not present

## 2019-07-28 DIAGNOSIS — E538 Deficiency of other specified B group vitamins: Secondary | ICD-10-CM | POA: Diagnosis not present

## 2019-07-28 DIAGNOSIS — I10 Essential (primary) hypertension: Secondary | ICD-10-CM | POA: Diagnosis not present

## 2019-07-28 DIAGNOSIS — R918 Other nonspecific abnormal finding of lung field: Secondary | ICD-10-CM | POA: Diagnosis not present

## 2019-07-28 DIAGNOSIS — I700 Atherosclerosis of aorta: Secondary | ICD-10-CM | POA: Diagnosis not present

## 2019-07-28 DIAGNOSIS — F0151 Vascular dementia with behavioral disturbance: Secondary | ICD-10-CM | POA: Diagnosis not present

## 2019-07-28 DIAGNOSIS — I456 Pre-excitation syndrome: Secondary | ICD-10-CM | POA: Diagnosis not present

## 2019-07-28 DIAGNOSIS — E039 Hypothyroidism, unspecified: Secondary | ICD-10-CM | POA: Diagnosis not present

## 2019-07-28 DIAGNOSIS — I2510 Atherosclerotic heart disease of native coronary artery without angina pectoris: Secondary | ICD-10-CM | POA: Diagnosis not present

## 2019-07-28 DIAGNOSIS — N189 Chronic kidney disease, unspecified: Secondary | ICD-10-CM | POA: Diagnosis not present

## 2019-07-28 DIAGNOSIS — I130 Hypertensive heart and chronic kidney disease with heart failure and stage 1 through stage 4 chronic kidney disease, or unspecified chronic kidney disease: Secondary | ICD-10-CM | POA: Diagnosis not present

## 2019-07-28 DIAGNOSIS — N2889 Other specified disorders of kidney and ureter: Secondary | ICD-10-CM | POA: Diagnosis not present

## 2019-07-28 DIAGNOSIS — J9611 Chronic respiratory failure with hypoxia: Secondary | ICD-10-CM | POA: Diagnosis not present

## 2019-07-28 DIAGNOSIS — U071 COVID-19: Secondary | ICD-10-CM | POA: Diagnosis not present

## 2019-07-28 DIAGNOSIS — J449 Chronic obstructive pulmonary disease, unspecified: Secondary | ICD-10-CM | POA: Diagnosis not present

## 2019-07-28 DIAGNOSIS — K5909 Other constipation: Secondary | ICD-10-CM | POA: Diagnosis not present

## 2019-07-28 DIAGNOSIS — R251 Tremor, unspecified: Secondary | ICD-10-CM | POA: Diagnosis not present

## 2019-07-28 DIAGNOSIS — J9601 Acute respiratory failure with hypoxia: Secondary | ICD-10-CM | POA: Diagnosis not present

## 2019-07-28 DIAGNOSIS — L988 Other specified disorders of the skin and subcutaneous tissue: Secondary | ICD-10-CM | POA: Diagnosis not present

## 2019-07-28 DIAGNOSIS — N390 Urinary tract infection, site not specified: Secondary | ICD-10-CM | POA: Diagnosis not present

## 2019-07-28 DIAGNOSIS — G309 Alzheimer's disease, unspecified: Secondary | ICD-10-CM | POA: Diagnosis not present

## 2019-07-28 DIAGNOSIS — D509 Iron deficiency anemia, unspecified: Secondary | ICD-10-CM | POA: Diagnosis not present

## 2019-07-28 DIAGNOSIS — R6889 Other general symptoms and signs: Secondary | ICD-10-CM | POA: Diagnosis not present

## 2019-07-28 DIAGNOSIS — M810 Age-related osteoporosis without current pathological fracture: Secondary | ICD-10-CM | POA: Diagnosis not present

## 2019-07-28 DIAGNOSIS — M1990 Unspecified osteoarthritis, unspecified site: Secondary | ICD-10-CM | POA: Diagnosis not present

## 2019-07-28 DIAGNOSIS — E871 Hypo-osmolality and hyponatremia: Secondary | ICD-10-CM | POA: Diagnosis not present

## 2019-07-28 DIAGNOSIS — I4891 Unspecified atrial fibrillation: Secondary | ICD-10-CM | POA: Diagnosis not present

## 2019-07-28 DIAGNOSIS — E785 Hyperlipidemia, unspecified: Secondary | ICD-10-CM | POA: Diagnosis not present

## 2019-07-28 DIAGNOSIS — Z9981 Dependence on supplemental oxygen: Secondary | ICD-10-CM | POA: Diagnosis not present

## 2019-07-28 DIAGNOSIS — I480 Paroxysmal atrial fibrillation: Secondary | ICD-10-CM | POA: Diagnosis not present

## 2019-07-28 DIAGNOSIS — R634 Abnormal weight loss: Secondary | ICD-10-CM | POA: Diagnosis not present

## 2019-07-28 DIAGNOSIS — I5022 Chronic systolic (congestive) heart failure: Secondary | ICD-10-CM | POA: Diagnosis not present

## 2019-07-29 DIAGNOSIS — J9601 Acute respiratory failure with hypoxia: Secondary | ICD-10-CM | POA: Diagnosis not present

## 2019-07-29 DIAGNOSIS — I69351 Hemiplegia and hemiparesis following cerebral infarction affecting right dominant side: Secondary | ICD-10-CM | POA: Diagnosis not present

## 2019-07-29 DIAGNOSIS — N289 Disorder of kidney and ureter, unspecified: Secondary | ICD-10-CM | POA: Diagnosis not present

## 2019-07-29 DIAGNOSIS — Z1211 Encounter for screening for malignant neoplasm of colon: Secondary | ICD-10-CM | POA: Diagnosis not present

## 2019-07-29 DIAGNOSIS — Z299 Encounter for prophylactic measures, unspecified: Secondary | ICD-10-CM | POA: Diagnosis not present

## 2019-07-29 DIAGNOSIS — I10 Essential (primary) hypertension: Secondary | ICD-10-CM | POA: Diagnosis not present

## 2019-07-29 DIAGNOSIS — Z1331 Encounter for screening for depression: Secondary | ICD-10-CM | POA: Diagnosis not present

## 2019-07-29 DIAGNOSIS — K529 Noninfective gastroenteritis and colitis, unspecified: Secondary | ICD-10-CM | POA: Diagnosis not present

## 2019-07-29 DIAGNOSIS — F17210 Nicotine dependence, cigarettes, uncomplicated: Secondary | ICD-10-CM | POA: Diagnosis not present

## 2019-07-29 DIAGNOSIS — U071 COVID-19: Secondary | ICD-10-CM | POA: Diagnosis not present

## 2019-07-29 DIAGNOSIS — G40909 Epilepsy, unspecified, not intractable, without status epilepticus: Secondary | ICD-10-CM | POA: Diagnosis not present

## 2019-07-29 DIAGNOSIS — E785 Hyperlipidemia, unspecified: Secondary | ICD-10-CM | POA: Diagnosis not present

## 2019-07-29 DIAGNOSIS — I69320 Aphasia following cerebral infarction: Secondary | ICD-10-CM | POA: Diagnosis not present

## 2019-07-29 DIAGNOSIS — J449 Chronic obstructive pulmonary disease, unspecified: Secondary | ICD-10-CM | POA: Diagnosis not present

## 2019-07-29 DIAGNOSIS — Z720 Tobacco use: Secondary | ICD-10-CM | POA: Diagnosis not present

## 2019-07-29 DIAGNOSIS — S43491A Other sprain of right shoulder joint, initial encounter: Secondary | ICD-10-CM | POA: Diagnosis not present

## 2019-07-29 DIAGNOSIS — Z0000 Encounter for general adult medical examination without abnormal findings: Secondary | ICD-10-CM | POA: Diagnosis not present

## 2019-07-29 DIAGNOSIS — I69322 Dysarthria following cerebral infarction: Secondary | ICD-10-CM | POA: Diagnosis not present

## 2019-07-29 DIAGNOSIS — Z1339 Encounter for screening examination for other mental health and behavioral disorders: Secondary | ICD-10-CM | POA: Diagnosis not present

## 2019-07-29 DIAGNOSIS — Z7189 Other specified counseling: Secondary | ICD-10-CM | POA: Diagnosis not present

## 2019-07-29 DIAGNOSIS — Z945 Skin transplant status: Secondary | ICD-10-CM | POA: Diagnosis not present

## 2019-07-29 DIAGNOSIS — Z7901 Long term (current) use of anticoagulants: Secondary | ICD-10-CM | POA: Diagnosis not present

## 2019-07-29 DIAGNOSIS — E119 Type 2 diabetes mellitus without complications: Secondary | ICD-10-CM | POA: Diagnosis not present

## 2019-07-29 DIAGNOSIS — M62838 Other muscle spasm: Secondary | ICD-10-CM | POA: Diagnosis not present

## 2019-07-29 DIAGNOSIS — I69311 Memory deficit following cerebral infarction: Secondary | ICD-10-CM | POA: Diagnosis not present

## 2019-07-29 DIAGNOSIS — Z6832 Body mass index (BMI) 32.0-32.9, adult: Secondary | ICD-10-CM | POA: Diagnosis not present

## 2019-07-30 DIAGNOSIS — N289 Disorder of kidney and ureter, unspecified: Secondary | ICD-10-CM | POA: Diagnosis not present

## 2019-07-30 DIAGNOSIS — M1712 Unilateral primary osteoarthritis, left knee: Secondary | ICD-10-CM | POA: Diagnosis not present

## 2019-07-30 DIAGNOSIS — I10 Essential (primary) hypertension: Secondary | ICD-10-CM | POA: Diagnosis not present

## 2019-07-30 DIAGNOSIS — S72001A Fracture of unspecified part of neck of right femur, initial encounter for closed fracture: Secondary | ICD-10-CM | POA: Diagnosis not present

## 2019-07-30 DIAGNOSIS — I2510 Atherosclerotic heart disease of native coronary artery without angina pectoris: Secondary | ICD-10-CM | POA: Diagnosis not present

## 2019-07-30 DIAGNOSIS — J9601 Acute respiratory failure with hypoxia: Secondary | ICD-10-CM | POA: Diagnosis not present

## 2019-07-30 DIAGNOSIS — E119 Type 2 diabetes mellitus without complications: Secondary | ICD-10-CM | POA: Diagnosis not present

## 2019-07-30 DIAGNOSIS — U071 COVID-19: Secondary | ICD-10-CM | POA: Diagnosis not present

## 2019-07-31 DIAGNOSIS — R739 Hyperglycemia, unspecified: Secondary | ICD-10-CM | POA: Diagnosis not present

## 2019-07-31 DIAGNOSIS — S72001A Fracture of unspecified part of neck of right femur, initial encounter for closed fracture: Secondary | ICD-10-CM | POA: Diagnosis not present

## 2019-07-31 DIAGNOSIS — E119 Type 2 diabetes mellitus without complications: Secondary | ICD-10-CM | POA: Diagnosis not present

## 2019-07-31 DIAGNOSIS — I2510 Atherosclerotic heart disease of native coronary artery without angina pectoris: Secondary | ICD-10-CM | POA: Diagnosis not present

## 2019-07-31 DIAGNOSIS — I10 Essential (primary) hypertension: Secondary | ICD-10-CM | POA: Diagnosis not present

## 2019-07-31 DIAGNOSIS — J9601 Acute respiratory failure with hypoxia: Secondary | ICD-10-CM | POA: Diagnosis not present

## 2019-07-31 DIAGNOSIS — K5732 Diverticulitis of large intestine without perforation or abscess without bleeding: Secondary | ICD-10-CM | POA: Diagnosis not present

## 2019-07-31 DIAGNOSIS — N390 Urinary tract infection, site not specified: Secondary | ICD-10-CM | POA: Diagnosis not present

## 2019-07-31 DIAGNOSIS — U071 COVID-19: Secondary | ICD-10-CM | POA: Diagnosis not present

## 2019-07-31 DIAGNOSIS — F419 Anxiety disorder, unspecified: Secondary | ICD-10-CM | POA: Diagnosis not present

## 2019-08-01 DIAGNOSIS — R279 Unspecified lack of coordination: Secondary | ICD-10-CM | POA: Diagnosis not present

## 2019-08-01 DIAGNOSIS — I10 Essential (primary) hypertension: Secondary | ICD-10-CM | POA: Diagnosis not present

## 2019-08-01 DIAGNOSIS — R739 Hyperglycemia, unspecified: Secondary | ICD-10-CM | POA: Diagnosis not present

## 2019-08-01 DIAGNOSIS — Z743 Need for continuous supervision: Secondary | ICD-10-CM | POA: Diagnosis not present

## 2019-08-01 DIAGNOSIS — S72001A Fracture of unspecified part of neck of right femur, initial encounter for closed fracture: Secondary | ICD-10-CM | POA: Diagnosis not present

## 2019-08-01 DIAGNOSIS — F419 Anxiety disorder, unspecified: Secondary | ICD-10-CM | POA: Diagnosis not present

## 2019-08-01 DIAGNOSIS — E119 Type 2 diabetes mellitus without complications: Secondary | ICD-10-CM | POA: Diagnosis not present

## 2019-08-01 DIAGNOSIS — I2510 Atherosclerotic heart disease of native coronary artery without angina pectoris: Secondary | ICD-10-CM | POA: Diagnosis not present

## 2019-08-01 DIAGNOSIS — U071 COVID-19: Secondary | ICD-10-CM | POA: Diagnosis not present

## 2019-08-01 DIAGNOSIS — J9601 Acute respiratory failure with hypoxia: Secondary | ICD-10-CM | POA: Diagnosis not present

## 2019-08-02 DIAGNOSIS — M19041 Primary osteoarthritis, right hand: Secondary | ICD-10-CM | POA: Diagnosis not present

## 2019-08-02 DIAGNOSIS — Z87891 Personal history of nicotine dependence: Secondary | ICD-10-CM | POA: Diagnosis not present

## 2019-08-02 DIAGNOSIS — U071 COVID-19: Secondary | ICD-10-CM | POA: Diagnosis not present

## 2019-08-02 DIAGNOSIS — Z5181 Encounter for therapeutic drug level monitoring: Secondary | ICD-10-CM | POA: Diagnosis not present

## 2019-08-02 DIAGNOSIS — M4626 Osteomyelitis of vertebra, lumbar region: Secondary | ICD-10-CM | POA: Diagnosis not present

## 2019-08-02 DIAGNOSIS — M4726 Other spondylosis with radiculopathy, lumbar region: Secondary | ICD-10-CM | POA: Diagnosis not present

## 2019-08-02 DIAGNOSIS — I10 Essential (primary) hypertension: Secondary | ICD-10-CM | POA: Diagnosis not present

## 2019-08-02 DIAGNOSIS — Z452 Encounter for adjustment and management of vascular access device: Secondary | ICD-10-CM | POA: Diagnosis not present

## 2019-08-02 DIAGNOSIS — J9601 Acute respiratory failure with hypoxia: Secondary | ICD-10-CM | POA: Diagnosis not present

## 2019-08-02 DIAGNOSIS — E1169 Type 2 diabetes mellitus with other specified complication: Secondary | ICD-10-CM | POA: Diagnosis not present

## 2019-08-02 DIAGNOSIS — I743 Embolism and thrombosis of arteries of the lower extremities: Secondary | ICD-10-CM | POA: Diagnosis not present

## 2019-08-02 DIAGNOSIS — Z792 Long term (current) use of antibiotics: Secondary | ICD-10-CM | POA: Diagnosis not present

## 2019-08-02 DIAGNOSIS — M19042 Primary osteoarthritis, left hand: Secondary | ICD-10-CM | POA: Diagnosis not present

## 2019-08-02 DIAGNOSIS — E785 Hyperlipidemia, unspecified: Secondary | ICD-10-CM | POA: Diagnosis not present

## 2019-08-02 DIAGNOSIS — I6602 Occlusion and stenosis of left middle cerebral artery: Secondary | ICD-10-CM | POA: Diagnosis not present

## 2019-08-02 DIAGNOSIS — I998 Other disorder of circulatory system: Secondary | ICD-10-CM | POA: Diagnosis not present

## 2019-08-02 DIAGNOSIS — M48061 Spinal stenosis, lumbar region without neurogenic claudication: Secondary | ICD-10-CM | POA: Diagnosis not present

## 2019-08-02 DIAGNOSIS — B9561 Methicillin susceptible Staphylococcus aureus infection as the cause of diseases classified elsewhere: Secondary | ICD-10-CM | POA: Diagnosis not present

## 2019-08-02 DIAGNOSIS — I9789 Other postprocedural complications and disorders of the circulatory system, not elsewhere classified: Secondary | ICD-10-CM | POA: Diagnosis not present

## 2019-08-02 DIAGNOSIS — Z933 Colostomy status: Secondary | ICD-10-CM | POA: Diagnosis not present

## 2019-08-03 DIAGNOSIS — M545 Low back pain: Secondary | ICD-10-CM | POA: Diagnosis not present

## 2019-08-03 DIAGNOSIS — J018 Other acute sinusitis: Secondary | ICD-10-CM | POA: Diagnosis not present

## 2019-08-03 DIAGNOSIS — M542 Cervicalgia: Secondary | ICD-10-CM | POA: Diagnosis not present

## 2019-08-03 DIAGNOSIS — R51 Headache: Secondary | ICD-10-CM | POA: Diagnosis not present

## 2019-08-03 DIAGNOSIS — M5137 Other intervertebral disc degeneration, lumbosacral region: Secondary | ICD-10-CM | POA: Diagnosis not present

## 2019-08-03 DIAGNOSIS — Z79891 Long term (current) use of opiate analgesic: Secondary | ICD-10-CM | POA: Diagnosis not present

## 2019-08-03 DIAGNOSIS — R29818 Other symptoms and signs involving the nervous system: Secondary | ICD-10-CM | POA: Diagnosis not present

## 2019-08-03 DIAGNOSIS — G8929 Other chronic pain: Secondary | ICD-10-CM | POA: Diagnosis not present

## 2019-08-03 DIAGNOSIS — I4891 Unspecified atrial fibrillation: Secondary | ICD-10-CM | POA: Diagnosis not present

## 2019-08-03 DIAGNOSIS — R079 Chest pain, unspecified: Secondary | ICD-10-CM | POA: Diagnosis not present

## 2019-08-04 DIAGNOSIS — D500 Iron deficiency anemia secondary to blood loss (chronic): Secondary | ICD-10-CM | POA: Diagnosis not present

## 2019-08-04 DIAGNOSIS — K5904 Chronic idiopathic constipation: Secondary | ICD-10-CM | POA: Diagnosis not present

## 2019-08-04 DIAGNOSIS — I119 Hypertensive heart disease without heart failure: Secondary | ICD-10-CM | POA: Diagnosis not present

## 2019-08-04 DIAGNOSIS — M810 Age-related osteoporosis without current pathological fracture: Secondary | ICD-10-CM | POA: Diagnosis not present

## 2019-08-04 DIAGNOSIS — C7931 Secondary malignant neoplasm of brain: Secondary | ICD-10-CM | POA: Diagnosis not present

## 2019-08-04 DIAGNOSIS — D509 Iron deficiency anemia, unspecified: Secondary | ICD-10-CM | POA: Diagnosis not present

## 2019-08-04 DIAGNOSIS — F331 Major depressive disorder, recurrent, moderate: Secondary | ICD-10-CM | POA: Diagnosis not present

## 2019-08-04 DIAGNOSIS — I480 Paroxysmal atrial fibrillation: Secondary | ICD-10-CM | POA: Diagnosis not present

## 2019-08-04 DIAGNOSIS — R351 Nocturia: Secondary | ICD-10-CM | POA: Diagnosis not present

## 2019-08-04 DIAGNOSIS — C3492 Malignant neoplasm of unspecified part of left bronchus or lung: Secondary | ICD-10-CM | POA: Diagnosis not present

## 2019-08-04 DIAGNOSIS — I2510 Atherosclerotic heart disease of native coronary artery without angina pectoris: Secondary | ICD-10-CM | POA: Diagnosis not present

## 2019-08-04 DIAGNOSIS — R000 Tachycardia, unspecified: Secondary | ICD-10-CM | POA: Diagnosis not present

## 2019-08-04 DIAGNOSIS — J4531 Mild persistent asthma with (acute) exacerbation: Secondary | ICD-10-CM | POA: Diagnosis not present

## 2019-08-04 DIAGNOSIS — M62838 Other muscle spasm: Secondary | ICD-10-CM | POA: Diagnosis not present

## 2019-08-04 DIAGNOSIS — Z961 Presence of intraocular lens: Secondary | ICD-10-CM | POA: Diagnosis not present

## 2019-08-04 DIAGNOSIS — E7800 Pure hypercholesterolemia, unspecified: Secondary | ICD-10-CM | POA: Diagnosis not present

## 2019-08-04 DIAGNOSIS — E781 Pure hyperglyceridemia: Secondary | ICD-10-CM | POA: Diagnosis not present

## 2019-08-04 DIAGNOSIS — E222 Syndrome of inappropriate secretion of antidiuretic hormone: Secondary | ICD-10-CM | POA: Diagnosis not present

## 2019-08-04 DIAGNOSIS — H43313 Vitreous membranes and strands, bilateral: Secondary | ICD-10-CM | POA: Diagnosis not present

## 2019-08-04 DIAGNOSIS — Z471 Aftercare following joint replacement surgery: Secondary | ICD-10-CM | POA: Diagnosis not present

## 2019-08-04 DIAGNOSIS — E559 Vitamin D deficiency, unspecified: Secondary | ICD-10-CM | POA: Diagnosis not present

## 2019-08-04 DIAGNOSIS — R51 Headache: Secondary | ICD-10-CM | POA: Diagnosis not present

## 2019-08-04 DIAGNOSIS — J439 Emphysema, unspecified: Secondary | ICD-10-CM | POA: Diagnosis not present

## 2019-08-04 DIAGNOSIS — E785 Hyperlipidemia, unspecified: Secondary | ICD-10-CM | POA: Diagnosis not present

## 2019-08-04 DIAGNOSIS — R0902 Hypoxemia: Secondary | ICD-10-CM | POA: Diagnosis not present

## 2019-08-04 DIAGNOSIS — H2512 Age-related nuclear cataract, left eye: Secondary | ICD-10-CM | POA: Diagnosis not present

## 2019-08-04 DIAGNOSIS — G500 Trigeminal neuralgia: Secondary | ICD-10-CM | POA: Diagnosis not present

## 2019-08-04 DIAGNOSIS — F411 Generalized anxiety disorder: Secondary | ICD-10-CM | POA: Diagnosis not present

## 2019-08-04 DIAGNOSIS — K279 Peptic ulcer, site unspecified, unspecified as acute or chronic, without hemorrhage or perforation: Secondary | ICD-10-CM | POA: Diagnosis not present

## 2019-08-04 DIAGNOSIS — M109 Gout, unspecified: Secondary | ICD-10-CM | POA: Diagnosis not present

## 2019-08-04 DIAGNOSIS — K449 Diaphragmatic hernia without obstruction or gangrene: Secondary | ICD-10-CM | POA: Diagnosis not present

## 2019-08-04 DIAGNOSIS — N401 Enlarged prostate with lower urinary tract symptoms: Secondary | ICD-10-CM | POA: Diagnosis not present

## 2019-08-04 DIAGNOSIS — M40209 Unspecified kyphosis, site unspecified: Secondary | ICD-10-CM | POA: Diagnosis not present

## 2019-08-05 DIAGNOSIS — J9621 Acute and chronic respiratory failure with hypoxia: Secondary | ICD-10-CM | POA: Diagnosis not present

## 2019-08-05 DIAGNOSIS — N189 Chronic kidney disease, unspecified: Secondary | ICD-10-CM | POA: Diagnosis not present

## 2019-08-05 DIAGNOSIS — N400 Enlarged prostate without lower urinary tract symptoms: Secondary | ICD-10-CM | POA: Diagnosis not present

## 2019-08-05 DIAGNOSIS — G4733 Obstructive sleep apnea (adult) (pediatric): Secondary | ICD-10-CM | POA: Diagnosis not present

## 2019-08-05 DIAGNOSIS — E1151 Type 2 diabetes mellitus with diabetic peripheral angiopathy without gangrene: Secondary | ICD-10-CM | POA: Diagnosis not present

## 2019-08-05 DIAGNOSIS — E039 Hypothyroidism, unspecified: Secondary | ICD-10-CM | POA: Diagnosis not present

## 2019-08-05 DIAGNOSIS — Z743 Need for continuous supervision: Secondary | ICD-10-CM | POA: Diagnosis not present

## 2019-08-05 DIAGNOSIS — C3491 Malignant neoplasm of unspecified part of right bronchus or lung: Secondary | ICD-10-CM | POA: Diagnosis not present

## 2019-08-05 DIAGNOSIS — J439 Emphysema, unspecified: Secondary | ICD-10-CM | POA: Diagnosis not present

## 2019-08-05 DIAGNOSIS — Z1211 Encounter for screening for malignant neoplasm of colon: Secondary | ICD-10-CM | POA: Diagnosis not present

## 2019-08-05 DIAGNOSIS — J90 Pleural effusion, not elsewhere classified: Secondary | ICD-10-CM | POA: Diagnosis not present

## 2019-08-05 DIAGNOSIS — R531 Weakness: Secondary | ICD-10-CM | POA: Diagnosis not present

## 2019-08-05 DIAGNOSIS — R918 Other nonspecific abnormal finding of lung field: Secondary | ICD-10-CM | POA: Diagnosis not present

## 2019-08-05 DIAGNOSIS — M170 Bilateral primary osteoarthritis of knee: Secondary | ICD-10-CM | POA: Diagnosis not present

## 2019-08-05 DIAGNOSIS — I480 Paroxysmal atrial fibrillation: Secondary | ICD-10-CM | POA: Diagnosis not present

## 2019-08-05 DIAGNOSIS — Z01419 Encounter for gynecological examination (general) (routine) without abnormal findings: Secondary | ICD-10-CM | POA: Diagnosis not present

## 2019-08-05 DIAGNOSIS — I110 Hypertensive heart disease with heart failure: Secondary | ICD-10-CM | POA: Diagnosis not present

## 2019-08-05 DIAGNOSIS — H04123 Dry eye syndrome of bilateral lacrimal glands: Secondary | ICD-10-CM | POA: Diagnosis not present

## 2019-08-05 DIAGNOSIS — M1990 Unspecified osteoarthritis, unspecified site: Secondary | ICD-10-CM | POA: Diagnosis not present

## 2019-08-05 DIAGNOSIS — J9611 Chronic respiratory failure with hypoxia: Secondary | ICD-10-CM | POA: Diagnosis not present

## 2019-08-05 DIAGNOSIS — I509 Heart failure, unspecified: Secondary | ICD-10-CM | POA: Diagnosis not present

## 2019-08-05 DIAGNOSIS — I5022 Chronic systolic (congestive) heart failure: Secondary | ICD-10-CM | POA: Diagnosis not present

## 2019-08-05 DIAGNOSIS — R6889 Other general symptoms and signs: Secondary | ICD-10-CM | POA: Diagnosis not present

## 2019-08-05 DIAGNOSIS — Z23 Encounter for immunization: Secondary | ICD-10-CM | POA: Diagnosis not present

## 2019-08-05 DIAGNOSIS — G2581 Restless legs syndrome: Secondary | ICD-10-CM | POA: Diagnosis not present

## 2019-08-05 DIAGNOSIS — E785 Hyperlipidemia, unspecified: Secondary | ICD-10-CM | POA: Diagnosis not present

## 2019-08-05 DIAGNOSIS — C7951 Secondary malignant neoplasm of bone: Secondary | ICD-10-CM | POA: Diagnosis not present

## 2019-08-05 DIAGNOSIS — Z9981 Dependence on supplemental oxygen: Secondary | ICD-10-CM | POA: Diagnosis not present

## 2019-08-05 DIAGNOSIS — H43812 Vitreous degeneration, left eye: Secondary | ICD-10-CM | POA: Diagnosis not present

## 2019-08-05 DIAGNOSIS — I4891 Unspecified atrial fibrillation: Secondary | ICD-10-CM | POA: Diagnosis not present

## 2019-08-05 DIAGNOSIS — J449 Chronic obstructive pulmonary disease, unspecified: Secondary | ICD-10-CM | POA: Diagnosis not present

## 2019-08-05 DIAGNOSIS — D3500 Benign neoplasm of unspecified adrenal gland: Secondary | ICD-10-CM | POA: Diagnosis not present

## 2019-08-05 DIAGNOSIS — I2510 Atherosclerotic heart disease of native coronary artery without angina pectoris: Secondary | ICD-10-CM | POA: Diagnosis not present

## 2019-08-05 DIAGNOSIS — D472 Monoclonal gammopathy: Secondary | ICD-10-CM | POA: Diagnosis not present

## 2019-08-05 DIAGNOSIS — R251 Tremor, unspecified: Secondary | ICD-10-CM | POA: Diagnosis not present

## 2019-08-05 DIAGNOSIS — F17200 Nicotine dependence, unspecified, uncomplicated: Secondary | ICD-10-CM | POA: Diagnosis not present

## 2019-08-05 DIAGNOSIS — K219 Gastro-esophageal reflux disease without esophagitis: Secondary | ICD-10-CM | POA: Diagnosis not present

## 2019-08-05 DIAGNOSIS — I130 Hypertensive heart and chronic kidney disease with heart failure and stage 1 through stage 4 chronic kidney disease, or unspecified chronic kidney disease: Secondary | ICD-10-CM | POA: Diagnosis not present

## 2019-08-05 DIAGNOSIS — E782 Mixed hyperlipidemia: Secondary | ICD-10-CM | POA: Diagnosis not present

## 2019-08-06 DIAGNOSIS — I350 Nonrheumatic aortic (valve) stenosis: Secondary | ICD-10-CM | POA: Diagnosis not present

## 2019-08-06 DIAGNOSIS — N400 Enlarged prostate without lower urinary tract symptoms: Secondary | ICD-10-CM | POA: Diagnosis not present

## 2019-08-06 DIAGNOSIS — D510 Vitamin B12 deficiency anemia due to intrinsic factor deficiency: Secondary | ICD-10-CM | POA: Diagnosis not present

## 2019-08-06 DIAGNOSIS — I69391 Dysphagia following cerebral infarction: Secondary | ICD-10-CM | POA: Diagnosis not present

## 2019-08-06 DIAGNOSIS — K449 Diaphragmatic hernia without obstruction or gangrene: Secondary | ICD-10-CM | POA: Diagnosis not present

## 2019-08-06 DIAGNOSIS — M1990 Unspecified osteoarthritis, unspecified site: Secondary | ICD-10-CM | POA: Diagnosis not present

## 2019-08-06 DIAGNOSIS — J439 Emphysema, unspecified: Secondary | ICD-10-CM | POA: Diagnosis not present

## 2019-08-06 DIAGNOSIS — R1310 Dysphagia, unspecified: Secondary | ICD-10-CM | POA: Diagnosis not present

## 2019-08-06 DIAGNOSIS — E785 Hyperlipidemia, unspecified: Secondary | ICD-10-CM | POA: Diagnosis not present

## 2019-08-06 DIAGNOSIS — I129 Hypertensive chronic kidney disease with stage 1 through stage 4 chronic kidney disease, or unspecified chronic kidney disease: Secondary | ICD-10-CM | POA: Diagnosis not present

## 2019-08-06 DIAGNOSIS — G8929 Other chronic pain: Secondary | ICD-10-CM | POA: Diagnosis not present

## 2019-08-06 DIAGNOSIS — K5720 Diverticulitis of large intestine with perforation and abscess without bleeding: Secondary | ICD-10-CM | POA: Diagnosis not present

## 2019-08-06 DIAGNOSIS — F0280 Dementia in other diseases classified elsewhere without behavioral disturbance: Secondary | ICD-10-CM | POA: Diagnosis not present

## 2019-08-06 DIAGNOSIS — N183 Chronic kidney disease, stage 3 (moderate): Secondary | ICD-10-CM | POA: Diagnosis not present

## 2019-08-06 DIAGNOSIS — M1A30X0 Chronic gout due to renal impairment, unspecified site, without tophus (tophi): Secondary | ICD-10-CM | POA: Diagnosis not present

## 2019-08-06 DIAGNOSIS — R6889 Other general symptoms and signs: Secondary | ICD-10-CM | POA: Diagnosis not present

## 2019-08-06 DIAGNOSIS — D692 Other nonthrombocytopenic purpura: Secondary | ICD-10-CM | POA: Diagnosis not present

## 2019-08-06 DIAGNOSIS — E039 Hypothyroidism, unspecified: Secondary | ICD-10-CM | POA: Diagnosis not present

## 2019-08-06 DIAGNOSIS — I69318 Other symptoms and signs involving cognitive functions following cerebral infarction: Secondary | ICD-10-CM | POA: Diagnosis not present

## 2019-08-06 DIAGNOSIS — I2510 Atherosclerotic heart disease of native coronary artery without angina pectoris: Secondary | ICD-10-CM | POA: Diagnosis not present

## 2019-08-06 DIAGNOSIS — E1122 Type 2 diabetes mellitus with diabetic chronic kidney disease: Secondary | ICD-10-CM | POA: Diagnosis not present

## 2019-08-06 DIAGNOSIS — K2980 Duodenitis without bleeding: Secondary | ICD-10-CM | POA: Diagnosis not present

## 2019-08-06 DIAGNOSIS — F0150 Vascular dementia without behavioral disturbance: Secondary | ICD-10-CM | POA: Diagnosis not present

## 2019-08-06 DIAGNOSIS — K589 Irritable bowel syndrome without diarrhea: Secondary | ICD-10-CM | POA: Diagnosis not present

## 2019-08-06 DIAGNOSIS — I10 Essential (primary) hypertension: Secondary | ICD-10-CM | POA: Diagnosis not present

## 2019-08-09 DIAGNOSIS — M545 Low back pain: Secondary | ICD-10-CM | POA: Diagnosis not present

## 2019-08-09 DIAGNOSIS — N400 Enlarged prostate without lower urinary tract symptoms: Secondary | ICD-10-CM | POA: Diagnosis not present

## 2019-08-09 DIAGNOSIS — K449 Diaphragmatic hernia without obstruction or gangrene: Secondary | ICD-10-CM | POA: Diagnosis not present

## 2019-08-09 DIAGNOSIS — K5720 Diverticulitis of large intestine with perforation and abscess without bleeding: Secondary | ICD-10-CM | POA: Diagnosis not present

## 2019-08-09 DIAGNOSIS — I129 Hypertensive chronic kidney disease with stage 1 through stage 4 chronic kidney disease, or unspecified chronic kidney disease: Secondary | ICD-10-CM | POA: Diagnosis not present

## 2019-08-09 DIAGNOSIS — I2692 Saddle embolus of pulmonary artery without acute cor pulmonale: Secondary | ICD-10-CM | POA: Diagnosis not present

## 2019-08-09 DIAGNOSIS — I82411 Acute embolism and thrombosis of right femoral vein: Secondary | ICD-10-CM | POA: Diagnosis not present

## 2019-08-09 DIAGNOSIS — D692 Other nonthrombocytopenic purpura: Secondary | ICD-10-CM | POA: Diagnosis not present

## 2019-08-09 DIAGNOSIS — G20 Parkinson's disease: Secondary | ICD-10-CM | POA: Diagnosis not present

## 2019-08-09 DIAGNOSIS — D510 Vitamin B12 deficiency anemia due to intrinsic factor deficiency: Secondary | ICD-10-CM | POA: Diagnosis not present

## 2019-08-09 DIAGNOSIS — R413 Other amnesia: Secondary | ICD-10-CM | POA: Diagnosis not present

## 2019-08-09 DIAGNOSIS — E039 Hypothyroidism, unspecified: Secondary | ICD-10-CM | POA: Diagnosis not present

## 2019-08-09 DIAGNOSIS — K2980 Duodenitis without bleeding: Secondary | ICD-10-CM | POA: Diagnosis not present

## 2019-08-09 DIAGNOSIS — G629 Polyneuropathy, unspecified: Secondary | ICD-10-CM | POA: Diagnosis not present

## 2019-08-09 DIAGNOSIS — E669 Obesity, unspecified: Secondary | ICD-10-CM | POA: Diagnosis not present

## 2019-08-09 DIAGNOSIS — R270 Ataxia, unspecified: Secondary | ICD-10-CM | POA: Diagnosis not present

## 2019-08-09 DIAGNOSIS — G8929 Other chronic pain: Secondary | ICD-10-CM | POA: Diagnosis not present

## 2019-08-09 DIAGNOSIS — N183 Chronic kidney disease, stage 3 (moderate): Secondary | ICD-10-CM | POA: Diagnosis not present

## 2019-08-09 DIAGNOSIS — M1990 Unspecified osteoarthritis, unspecified site: Secondary | ICD-10-CM | POA: Diagnosis not present

## 2019-08-09 DIAGNOSIS — R6889 Other general symptoms and signs: Secondary | ICD-10-CM | POA: Diagnosis not present

## 2019-08-09 DIAGNOSIS — F0280 Dementia in other diseases classified elsewhere without behavioral disturbance: Secondary | ICD-10-CM | POA: Diagnosis not present

## 2019-08-09 DIAGNOSIS — K589 Irritable bowel syndrome without diarrhea: Secondary | ICD-10-CM | POA: Diagnosis not present

## 2019-08-09 DIAGNOSIS — I10 Essential (primary) hypertension: Secondary | ICD-10-CM | POA: Diagnosis not present

## 2019-08-09 DIAGNOSIS — E1122 Type 2 diabetes mellitus with diabetic chronic kidney disease: Secondary | ICD-10-CM | POA: Diagnosis not present

## 2019-08-09 DIAGNOSIS — Z7901 Long term (current) use of anticoagulants: Secondary | ICD-10-CM | POA: Diagnosis not present

## 2019-08-10 DIAGNOSIS — L570 Actinic keratosis: Secondary | ICD-10-CM | POA: Diagnosis not present

## 2019-08-10 DIAGNOSIS — E1159 Type 2 diabetes mellitus with other circulatory complications: Secondary | ICD-10-CM | POA: Diagnosis not present

## 2019-08-10 DIAGNOSIS — M47812 Spondylosis without myelopathy or radiculopathy, cervical region: Secondary | ICD-10-CM | POA: Diagnosis not present

## 2019-08-10 DIAGNOSIS — K7460 Unspecified cirrhosis of liver: Secondary | ICD-10-CM | POA: Diagnosis not present

## 2019-08-10 DIAGNOSIS — R55 Syncope and collapse: Secondary | ICD-10-CM | POA: Diagnosis not present

## 2019-08-10 DIAGNOSIS — I480 Paroxysmal atrial fibrillation: Secondary | ICD-10-CM | POA: Diagnosis not present

## 2019-08-10 DIAGNOSIS — R6889 Other general symptoms and signs: Secondary | ICD-10-CM | POA: Diagnosis not present

## 2019-08-10 DIAGNOSIS — I5032 Chronic diastolic (congestive) heart failure: Secondary | ICD-10-CM | POA: Diagnosis not present

## 2019-08-10 DIAGNOSIS — M4802 Spinal stenosis, cervical region: Secondary | ICD-10-CM | POA: Diagnosis not present

## 2019-08-10 DIAGNOSIS — R509 Fever, unspecified: Secondary | ICD-10-CM | POA: Diagnosis not present

## 2019-08-10 DIAGNOSIS — K5720 Diverticulitis of large intestine with perforation and abscess without bleeding: Secondary | ICD-10-CM | POA: Diagnosis not present

## 2019-08-10 DIAGNOSIS — R140 Abdominal distension (gaseous): Secondary | ICD-10-CM | POA: Diagnosis not present

## 2019-08-10 DIAGNOSIS — I10 Essential (primary) hypertension: Secondary | ICD-10-CM | POA: Diagnosis not present

## 2019-08-10 DIAGNOSIS — M5021 Other cervical disc displacement,  high cervical region: Secondary | ICD-10-CM | POA: Diagnosis not present

## 2019-08-10 DIAGNOSIS — M5033 Other cervical disc degeneration, cervicothoracic region: Secondary | ICD-10-CM | POA: Diagnosis not present

## 2019-08-11 DIAGNOSIS — I10 Essential (primary) hypertension: Secondary | ICD-10-CM | POA: Diagnosis not present

## 2019-08-11 DIAGNOSIS — Z87891 Personal history of nicotine dependence: Secondary | ICD-10-CM | POA: Diagnosis not present

## 2019-08-11 DIAGNOSIS — M19042 Primary osteoarthritis, left hand: Secondary | ICD-10-CM | POA: Diagnosis not present

## 2019-08-11 DIAGNOSIS — F0280 Dementia in other diseases classified elsewhere without behavioral disturbance: Secondary | ICD-10-CM | POA: Diagnosis not present

## 2019-08-11 DIAGNOSIS — B9561 Methicillin susceptible Staphylococcus aureus infection as the cause of diseases classified elsewhere: Secondary | ICD-10-CM | POA: Diagnosis not present

## 2019-08-11 DIAGNOSIS — M1990 Unspecified osteoarthritis, unspecified site: Secondary | ICD-10-CM | POA: Diagnosis not present

## 2019-08-11 DIAGNOSIS — C3490 Malignant neoplasm of unspecified part of unspecified bronchus or lung: Secondary | ICD-10-CM | POA: Diagnosis not present

## 2019-08-11 DIAGNOSIS — Z452 Encounter for adjustment and management of vascular access device: Secondary | ICD-10-CM | POA: Diagnosis not present

## 2019-08-11 DIAGNOSIS — E872 Acidosis: Secondary | ICD-10-CM | POA: Diagnosis not present

## 2019-08-11 DIAGNOSIS — K5720 Diverticulitis of large intestine with perforation and abscess without bleeding: Secondary | ICD-10-CM | POA: Diagnosis not present

## 2019-08-11 DIAGNOSIS — K589 Irritable bowel syndrome without diarrhea: Secondary | ICD-10-CM | POA: Diagnosis not present

## 2019-08-11 DIAGNOSIS — E039 Hypothyroidism, unspecified: Secondary | ICD-10-CM | POA: Diagnosis not present

## 2019-08-11 DIAGNOSIS — K2980 Duodenitis without bleeding: Secondary | ICD-10-CM | POA: Diagnosis not present

## 2019-08-11 DIAGNOSIS — R6889 Other general symptoms and signs: Secondary | ICD-10-CM | POA: Diagnosis not present

## 2019-08-11 DIAGNOSIS — M1711 Unilateral primary osteoarthritis, right knee: Secondary | ICD-10-CM | POA: Diagnosis not present

## 2019-08-11 DIAGNOSIS — M4626 Osteomyelitis of vertebra, lumbar region: Secondary | ICD-10-CM | POA: Diagnosis not present

## 2019-08-11 DIAGNOSIS — Z5181 Encounter for therapeutic drug level monitoring: Secondary | ICD-10-CM | POA: Diagnosis not present

## 2019-08-11 DIAGNOSIS — M48061 Spinal stenosis, lumbar region without neurogenic claudication: Secondary | ICD-10-CM | POA: Diagnosis not present

## 2019-08-11 DIAGNOSIS — Z1231 Encounter for screening mammogram for malignant neoplasm of breast: Secondary | ICD-10-CM | POA: Diagnosis not present

## 2019-08-11 DIAGNOSIS — E119 Type 2 diabetes mellitus without complications: Secondary | ICD-10-CM | POA: Diagnosis not present

## 2019-08-11 DIAGNOSIS — N183 Chronic kidney disease, stage 3 (moderate): Secondary | ICD-10-CM | POA: Diagnosis not present

## 2019-08-11 DIAGNOSIS — F1010 Alcohol abuse, uncomplicated: Secondary | ICD-10-CM | POA: Diagnosis not present

## 2019-08-11 DIAGNOSIS — D510 Vitamin B12 deficiency anemia due to intrinsic factor deficiency: Secondary | ICD-10-CM | POA: Diagnosis not present

## 2019-08-11 DIAGNOSIS — E1122 Type 2 diabetes mellitus with diabetic chronic kidney disease: Secondary | ICD-10-CM | POA: Diagnosis not present

## 2019-08-11 DIAGNOSIS — D61818 Other pancytopenia: Secondary | ICD-10-CM | POA: Diagnosis not present

## 2019-08-11 DIAGNOSIS — Z792 Long term (current) use of antibiotics: Secondary | ICD-10-CM | POA: Diagnosis not present

## 2019-08-11 DIAGNOSIS — K7460 Unspecified cirrhosis of liver: Secondary | ICD-10-CM | POA: Diagnosis not present

## 2019-08-11 DIAGNOSIS — A419 Sepsis, unspecified organism: Secondary | ICD-10-CM | POA: Diagnosis not present

## 2019-08-11 DIAGNOSIS — E785 Hyperlipidemia, unspecified: Secondary | ICD-10-CM | POA: Diagnosis not present

## 2019-08-11 DIAGNOSIS — D649 Anemia, unspecified: Secondary | ICD-10-CM | POA: Diagnosis not present

## 2019-08-11 DIAGNOSIS — M19041 Primary osteoarthritis, right hand: Secondary | ICD-10-CM | POA: Diagnosis not present

## 2019-08-11 DIAGNOSIS — G8929 Other chronic pain: Secondary | ICD-10-CM | POA: Diagnosis not present

## 2019-08-11 DIAGNOSIS — D692 Other nonthrombocytopenic purpura: Secondary | ICD-10-CM | POA: Diagnosis not present

## 2019-08-11 DIAGNOSIS — R748 Abnormal levels of other serum enzymes: Secondary | ICD-10-CM | POA: Diagnosis not present

## 2019-08-11 DIAGNOSIS — E1169 Type 2 diabetes mellitus with other specified complication: Secondary | ICD-10-CM | POA: Diagnosis not present

## 2019-08-11 DIAGNOSIS — K449 Diaphragmatic hernia without obstruction or gangrene: Secondary | ICD-10-CM | POA: Diagnosis not present

## 2019-08-11 DIAGNOSIS — M4726 Other spondylosis with radiculopathy, lumbar region: Secondary | ICD-10-CM | POA: Diagnosis not present

## 2019-08-11 DIAGNOSIS — Z96651 Presence of right artificial knee joint: Secondary | ICD-10-CM | POA: Diagnosis not present

## 2019-08-12 DIAGNOSIS — H2513 Age-related nuclear cataract, bilateral: Secondary | ICD-10-CM | POA: Diagnosis not present

## 2019-08-12 DIAGNOSIS — A419 Sepsis, unspecified organism: Secondary | ICD-10-CM | POA: Diagnosis not present

## 2019-08-12 DIAGNOSIS — H52223 Regular astigmatism, bilateral: Secondary | ICD-10-CM | POA: Diagnosis not present

## 2019-08-12 DIAGNOSIS — R748 Abnormal levels of other serum enzymes: Secondary | ICD-10-CM | POA: Diagnosis not present

## 2019-08-12 DIAGNOSIS — H524 Presbyopia: Secondary | ICD-10-CM | POA: Diagnosis not present

## 2019-08-12 DIAGNOSIS — R768 Other specified abnormal immunological findings in serum: Secondary | ICD-10-CM | POA: Diagnosis not present

## 2019-08-12 DIAGNOSIS — D61818 Other pancytopenia: Secondary | ICD-10-CM | POA: Diagnosis not present

## 2019-08-12 DIAGNOSIS — M170 Bilateral primary osteoarthritis of knee: Secondary | ICD-10-CM | POA: Diagnosis not present

## 2019-08-12 DIAGNOSIS — H5213 Myopia, bilateral: Secondary | ICD-10-CM | POA: Diagnosis not present

## 2019-08-12 DIAGNOSIS — L820 Inflamed seborrheic keratosis: Secondary | ICD-10-CM | POA: Diagnosis not present

## 2019-08-12 DIAGNOSIS — C3490 Malignant neoplasm of unspecified part of unspecified bronchus or lung: Secondary | ICD-10-CM | POA: Diagnosis not present

## 2019-08-12 DIAGNOSIS — I82409 Acute embolism and thrombosis of unspecified deep veins of unspecified lower extremity: Secondary | ICD-10-CM | POA: Diagnosis not present

## 2019-08-12 DIAGNOSIS — H25013 Cortical age-related cataract, bilateral: Secondary | ICD-10-CM | POA: Diagnosis not present

## 2019-08-13 DIAGNOSIS — R6889 Other general symptoms and signs: Secondary | ICD-10-CM | POA: Diagnosis not present

## 2019-08-13 DIAGNOSIS — M1990 Unspecified osteoarthritis, unspecified site: Secondary | ICD-10-CM | POA: Diagnosis not present

## 2019-08-13 DIAGNOSIS — A419 Sepsis, unspecified organism: Secondary | ICD-10-CM | POA: Diagnosis not present

## 2019-08-13 DIAGNOSIS — D61818 Other pancytopenia: Secondary | ICD-10-CM | POA: Diagnosis not present

## 2019-08-13 DIAGNOSIS — G8222 Paraplegia, incomplete: Secondary | ICD-10-CM | POA: Diagnosis not present

## 2019-08-13 DIAGNOSIS — Z9861 Coronary angioplasty status: Secondary | ICD-10-CM | POA: Diagnosis not present

## 2019-08-13 DIAGNOSIS — Z0000 Encounter for general adult medical examination without abnormal findings: Secondary | ICD-10-CM | POA: Diagnosis not present

## 2019-08-13 DIAGNOSIS — R748 Abnormal levels of other serum enzymes: Secondary | ICD-10-CM | POA: Diagnosis not present

## 2019-08-13 DIAGNOSIS — E7800 Pure hypercholesterolemia, unspecified: Secondary | ICD-10-CM | POA: Diagnosis not present

## 2019-08-13 DIAGNOSIS — R569 Unspecified convulsions: Secondary | ICD-10-CM | POA: Diagnosis not present

## 2019-08-13 DIAGNOSIS — G809 Cerebral palsy, unspecified: Secondary | ICD-10-CM | POA: Diagnosis not present

## 2019-08-13 DIAGNOSIS — Z5181 Encounter for therapeutic drug level monitoring: Secondary | ICD-10-CM | POA: Diagnosis not present

## 2019-08-13 DIAGNOSIS — Z993 Dependence on wheelchair: Secondary | ICD-10-CM | POA: Diagnosis not present

## 2019-08-13 DIAGNOSIS — I119 Hypertensive heart disease without heart failure: Secondary | ICD-10-CM | POA: Diagnosis not present

## 2019-08-13 DIAGNOSIS — C3490 Malignant neoplasm of unspecified part of unspecified bronchus or lung: Secondary | ICD-10-CM | POA: Diagnosis not present

## 2019-08-13 DIAGNOSIS — K219 Gastro-esophageal reflux disease without esophagitis: Secondary | ICD-10-CM | POA: Diagnosis not present

## 2019-08-14 DIAGNOSIS — N179 Acute kidney failure, unspecified: Secondary | ICD-10-CM | POA: Diagnosis not present

## 2019-08-14 DIAGNOSIS — D61818 Other pancytopenia: Secondary | ICD-10-CM | POA: Diagnosis not present

## 2019-08-14 DIAGNOSIS — H538 Other visual disturbances: Secondary | ICD-10-CM | POA: Diagnosis not present

## 2019-08-14 DIAGNOSIS — D638 Anemia in other chronic diseases classified elsewhere: Secondary | ICD-10-CM | POA: Diagnosis not present

## 2019-08-14 DIAGNOSIS — I10 Essential (primary) hypertension: Secondary | ICD-10-CM | POA: Diagnosis not present

## 2019-08-14 DIAGNOSIS — I714 Abdominal aortic aneurysm, without rupture: Secondary | ICD-10-CM | POA: Diagnosis not present

## 2019-08-14 DIAGNOSIS — M5489 Other dorsalgia: Secondary | ICD-10-CM | POA: Diagnosis not present

## 2019-08-14 DIAGNOSIS — C3490 Malignant neoplasm of unspecified part of unspecified bronchus or lung: Secondary | ICD-10-CM | POA: Diagnosis not present

## 2019-08-14 DIAGNOSIS — R338 Other retention of urine: Secondary | ICD-10-CM | POA: Diagnosis not present

## 2019-08-14 DIAGNOSIS — M545 Low back pain: Secondary | ICD-10-CM | POA: Diagnosis not present

## 2019-08-14 DIAGNOSIS — E785 Hyperlipidemia, unspecified: Secondary | ICD-10-CM | POA: Diagnosis not present

## 2019-08-14 DIAGNOSIS — K219 Gastro-esophageal reflux disease without esophagitis: Secondary | ICD-10-CM | POA: Diagnosis not present

## 2019-08-14 DIAGNOSIS — A419 Sepsis, unspecified organism: Secondary | ICD-10-CM | POA: Diagnosis not present

## 2019-08-14 DIAGNOSIS — R51 Headache: Secondary | ICD-10-CM | POA: Diagnosis not present

## 2019-08-14 DIAGNOSIS — R42 Dizziness and giddiness: Secondary | ICD-10-CM | POA: Diagnosis not present

## 2019-08-14 DIAGNOSIS — L89152 Pressure ulcer of sacral region, stage 2: Secondary | ICD-10-CM | POA: Diagnosis not present

## 2019-08-14 DIAGNOSIS — F329 Major depressive disorder, single episode, unspecified: Secondary | ICD-10-CM | POA: Diagnosis not present

## 2019-08-14 DIAGNOSIS — R200 Anesthesia of skin: Secondary | ICD-10-CM | POA: Diagnosis not present

## 2019-08-14 DIAGNOSIS — B9620 Unspecified Escherichia coli [E. coli] as the cause of diseases classified elsewhere: Secondary | ICD-10-CM | POA: Diagnosis not present

## 2019-08-14 DIAGNOSIS — R58 Hemorrhage, not elsewhere classified: Secondary | ICD-10-CM | POA: Diagnosis not present

## 2019-08-14 DIAGNOSIS — F0390 Unspecified dementia without behavioral disturbance: Secondary | ICD-10-CM | POA: Diagnosis not present

## 2019-08-14 DIAGNOSIS — E43 Unspecified severe protein-calorie malnutrition: Secondary | ICD-10-CM | POA: Diagnosis not present

## 2019-08-14 DIAGNOSIS — J449 Chronic obstructive pulmonary disease, unspecified: Secondary | ICD-10-CM | POA: Diagnosis not present

## 2019-08-14 DIAGNOSIS — N401 Enlarged prostate with lower urinary tract symptoms: Secondary | ICD-10-CM | POA: Diagnosis not present

## 2019-08-14 DIAGNOSIS — N390 Urinary tract infection, site not specified: Secondary | ICD-10-CM | POA: Diagnosis not present

## 2019-08-14 DIAGNOSIS — R748 Abnormal levels of other serum enzymes: Secondary | ICD-10-CM | POA: Diagnosis not present

## 2019-08-15 DIAGNOSIS — E119 Type 2 diabetes mellitus without complications: Secondary | ICD-10-CM | POA: Diagnosis not present

## 2019-08-15 DIAGNOSIS — Z9181 History of falling: Secondary | ICD-10-CM | POA: Diagnosis not present

## 2019-08-15 DIAGNOSIS — Z7982 Long term (current) use of aspirin: Secondary | ICD-10-CM | POA: Diagnosis not present

## 2019-08-15 DIAGNOSIS — M1A0710 Idiopathic chronic gout, right ankle and foot, without tophus (tophi): Secondary | ICD-10-CM | POA: Diagnosis not present

## 2019-08-15 DIAGNOSIS — E11621 Type 2 diabetes mellitus with foot ulcer: Secondary | ICD-10-CM | POA: Diagnosis not present

## 2019-08-15 DIAGNOSIS — E785 Hyperlipidemia, unspecified: Secondary | ICD-10-CM | POA: Diagnosis not present

## 2019-08-15 DIAGNOSIS — D61818 Other pancytopenia: Secondary | ICD-10-CM | POA: Diagnosis not present

## 2019-08-15 DIAGNOSIS — W19XXXA Unspecified fall, initial encounter: Secondary | ICD-10-CM | POA: Diagnosis not present

## 2019-08-15 DIAGNOSIS — E1142 Type 2 diabetes mellitus with diabetic polyneuropathy: Secondary | ICD-10-CM | POA: Diagnosis not present

## 2019-08-15 DIAGNOSIS — R4182 Altered mental status, unspecified: Secondary | ICD-10-CM | POA: Diagnosis not present

## 2019-08-15 DIAGNOSIS — I10 Essential (primary) hypertension: Secondary | ICD-10-CM | POA: Diagnosis not present

## 2019-08-15 DIAGNOSIS — I2510 Atherosclerotic heart disease of native coronary artery without angina pectoris: Secondary | ICD-10-CM | POA: Diagnosis not present

## 2019-08-15 DIAGNOSIS — L089 Local infection of the skin and subcutaneous tissue, unspecified: Secondary | ICD-10-CM | POA: Diagnosis not present

## 2019-08-15 DIAGNOSIS — Z87891 Personal history of nicotine dependence: Secondary | ICD-10-CM | POA: Diagnosis not present

## 2019-08-15 DIAGNOSIS — R748 Abnormal levels of other serum enzymes: Secondary | ICD-10-CM | POA: Diagnosis not present

## 2019-08-15 DIAGNOSIS — D649 Anemia, unspecified: Secondary | ICD-10-CM | POA: Diagnosis not present

## 2019-08-15 DIAGNOSIS — K219 Gastro-esophageal reflux disease without esophagitis: Secondary | ICD-10-CM | POA: Diagnosis not present

## 2019-08-15 DIAGNOSIS — L97521 Non-pressure chronic ulcer of other part of left foot limited to breakdown of skin: Secondary | ICD-10-CM | POA: Diagnosis not present

## 2019-08-15 DIAGNOSIS — A419 Sepsis, unspecified organism: Secondary | ICD-10-CM | POA: Diagnosis not present

## 2019-08-15 DIAGNOSIS — C3490 Malignant neoplasm of unspecified part of unspecified bronchus or lung: Secondary | ICD-10-CM | POA: Diagnosis not present

## 2019-08-16 DIAGNOSIS — F329 Major depressive disorder, single episode, unspecified: Secondary | ICD-10-CM | POA: Diagnosis not present

## 2019-08-16 DIAGNOSIS — D638 Anemia in other chronic diseases classified elsewhere: Secondary | ICD-10-CM | POA: Diagnosis not present

## 2019-08-16 DIAGNOSIS — G8194 Hemiplegia, unspecified affecting left nondominant side: Secondary | ICD-10-CM | POA: Diagnosis not present

## 2019-08-16 DIAGNOSIS — M1990 Unspecified osteoarthritis, unspecified site: Secondary | ICD-10-CM | POA: Diagnosis not present

## 2019-08-16 DIAGNOSIS — K449 Diaphragmatic hernia without obstruction or gangrene: Secondary | ICD-10-CM | POA: Diagnosis not present

## 2019-08-16 DIAGNOSIS — H52203 Unspecified astigmatism, bilateral: Secondary | ICD-10-CM | POA: Diagnosis not present

## 2019-08-16 DIAGNOSIS — E43 Unspecified severe protein-calorie malnutrition: Secondary | ICD-10-CM | POA: Diagnosis not present

## 2019-08-16 DIAGNOSIS — R1314 Dysphagia, pharyngoesophageal phase: Secondary | ICD-10-CM | POA: Diagnosis not present

## 2019-08-16 DIAGNOSIS — H43813 Vitreous degeneration, bilateral: Secondary | ICD-10-CM | POA: Diagnosis not present

## 2019-08-16 DIAGNOSIS — M21372 Foot drop, left foot: Secondary | ICD-10-CM | POA: Diagnosis not present

## 2019-08-16 DIAGNOSIS — Z79891 Long term (current) use of opiate analgesic: Secondary | ICD-10-CM | POA: Diagnosis not present

## 2019-08-16 DIAGNOSIS — Q796 Ehlers-Danlos syndrome, unspecified: Secondary | ICD-10-CM | POA: Diagnosis not present

## 2019-08-16 DIAGNOSIS — D649 Anemia, unspecified: Secondary | ICD-10-CM | POA: Diagnosis not present

## 2019-08-16 DIAGNOSIS — J449 Chronic obstructive pulmonary disease, unspecified: Secondary | ICD-10-CM | POA: Diagnosis not present

## 2019-08-16 DIAGNOSIS — E039 Hypothyroidism, unspecified: Secondary | ICD-10-CM | POA: Diagnosis not present

## 2019-08-16 DIAGNOSIS — B9620 Unspecified Escherichia coli [E. coli] as the cause of diseases classified elsewhere: Secondary | ICD-10-CM | POA: Diagnosis not present

## 2019-08-16 DIAGNOSIS — K219 Gastro-esophageal reflux disease without esophagitis: Secondary | ICD-10-CM | POA: Diagnosis not present

## 2019-08-16 DIAGNOSIS — L89152 Pressure ulcer of sacral region, stage 2: Secondary | ICD-10-CM | POA: Diagnosis not present

## 2019-08-16 DIAGNOSIS — I7300 Raynaud's syndrome without gangrene: Secondary | ICD-10-CM | POA: Diagnosis not present

## 2019-08-16 DIAGNOSIS — Z9181 History of falling: Secondary | ICD-10-CM | POA: Diagnosis not present

## 2019-08-16 DIAGNOSIS — M47816 Spondylosis without myelopathy or radiculopathy, lumbar region: Secondary | ICD-10-CM | POA: Diagnosis not present

## 2019-08-16 DIAGNOSIS — I482 Chronic atrial fibrillation, unspecified: Secondary | ICD-10-CM | POA: Diagnosis not present

## 2019-08-16 DIAGNOSIS — I714 Abdominal aortic aneurysm, without rupture: Secondary | ICD-10-CM | POA: Diagnosis not present

## 2019-08-16 DIAGNOSIS — Z1159 Encounter for screening for other viral diseases: Secondary | ICD-10-CM | POA: Diagnosis not present

## 2019-08-16 DIAGNOSIS — N390 Urinary tract infection, site not specified: Secondary | ICD-10-CM | POA: Diagnosis not present

## 2019-08-16 DIAGNOSIS — R4182 Altered mental status, unspecified: Secondary | ICD-10-CM | POA: Diagnosis not present

## 2019-08-16 DIAGNOSIS — C3432 Malignant neoplasm of lower lobe, left bronchus or lung: Secondary | ICD-10-CM | POA: Diagnosis not present

## 2019-08-16 DIAGNOSIS — N183 Chronic kidney disease, stage 3 (moderate): Secondary | ICD-10-CM | POA: Diagnosis not present

## 2019-08-16 DIAGNOSIS — E785 Hyperlipidemia, unspecified: Secondary | ICD-10-CM | POA: Diagnosis not present

## 2019-08-16 DIAGNOSIS — W19XXXA Unspecified fall, initial encounter: Secondary | ICD-10-CM | POA: Diagnosis not present

## 2019-08-16 DIAGNOSIS — H524 Presbyopia: Secondary | ICD-10-CM | POA: Diagnosis not present

## 2019-08-16 DIAGNOSIS — K651 Peritoneal abscess: Secondary | ICD-10-CM | POA: Diagnosis not present

## 2019-08-16 DIAGNOSIS — C159 Malignant neoplasm of esophagus, unspecified: Secondary | ICD-10-CM | POA: Diagnosis not present

## 2019-08-16 DIAGNOSIS — R32 Unspecified urinary incontinence: Secondary | ICD-10-CM | POA: Diagnosis not present

## 2019-08-16 DIAGNOSIS — E1122 Type 2 diabetes mellitus with diabetic chronic kidney disease: Secondary | ICD-10-CM | POA: Diagnosis not present

## 2019-08-16 DIAGNOSIS — Z87440 Personal history of urinary (tract) infections: Secondary | ICD-10-CM | POA: Diagnosis not present

## 2019-08-16 DIAGNOSIS — S72452D Displaced supracondylar fracture without intracondylar extension of lower end of left femur, subsequent encounter for closed fracture with routine healing: Secondary | ICD-10-CM | POA: Diagnosis not present

## 2019-08-16 DIAGNOSIS — M6281 Muscle weakness (generalized): Secondary | ICD-10-CM | POA: Diagnosis not present

## 2019-08-16 DIAGNOSIS — D510 Vitamin B12 deficiency anemia due to intrinsic factor deficiency: Secondary | ICD-10-CM | POA: Diagnosis not present

## 2019-08-16 DIAGNOSIS — D692 Other nonthrombocytopenic purpura: Secondary | ICD-10-CM | POA: Diagnosis not present

## 2019-08-16 DIAGNOSIS — F0390 Unspecified dementia without behavioral disturbance: Secondary | ICD-10-CM | POA: Diagnosis not present

## 2019-08-16 DIAGNOSIS — K2980 Duodenitis without bleeding: Secondary | ICD-10-CM | POA: Diagnosis not present

## 2019-08-16 DIAGNOSIS — D62 Acute posthemorrhagic anemia: Secondary | ICD-10-CM | POA: Diagnosis not present

## 2019-08-16 DIAGNOSIS — F0280 Dementia in other diseases classified elsewhere without behavioral disturbance: Secondary | ICD-10-CM | POA: Diagnosis not present

## 2019-08-16 DIAGNOSIS — I10 Essential (primary) hypertension: Secondary | ICD-10-CM | POA: Diagnosis not present

## 2019-08-16 DIAGNOSIS — H5213 Myopia, bilateral: Secondary | ICD-10-CM | POA: Diagnosis not present

## 2019-08-16 DIAGNOSIS — N401 Enlarged prostate with lower urinary tract symptoms: Secondary | ICD-10-CM | POA: Diagnosis not present

## 2019-08-16 DIAGNOSIS — M542 Cervicalgia: Secondary | ICD-10-CM | POA: Diagnosis not present

## 2019-08-16 DIAGNOSIS — R338 Other retention of urine: Secondary | ICD-10-CM | POA: Diagnosis not present

## 2019-08-16 DIAGNOSIS — Z87891 Personal history of nicotine dependence: Secondary | ICD-10-CM | POA: Diagnosis not present

## 2019-08-16 DIAGNOSIS — G8929 Other chronic pain: Secondary | ICD-10-CM | POA: Diagnosis not present

## 2019-08-16 DIAGNOSIS — K5720 Diverticulitis of large intestine with perforation and abscess without bleeding: Secondary | ICD-10-CM | POA: Diagnosis not present

## 2019-08-16 DIAGNOSIS — E119 Type 2 diabetes mellitus without complications: Secondary | ICD-10-CM | POA: Diagnosis not present

## 2019-08-16 DIAGNOSIS — H43393 Other vitreous opacities, bilateral: Secondary | ICD-10-CM | POA: Diagnosis not present

## 2019-08-16 DIAGNOSIS — M9712XD Periprosthetic fracture around internal prosthetic left knee joint, subsequent encounter: Secondary | ICD-10-CM | POA: Diagnosis not present

## 2019-08-16 DIAGNOSIS — M47812 Spondylosis without myelopathy or radiculopathy, cervical region: Secondary | ICD-10-CM | POA: Diagnosis not present

## 2019-08-16 DIAGNOSIS — M961 Postlaminectomy syndrome, not elsewhere classified: Secondary | ICD-10-CM | POA: Diagnosis not present

## 2019-08-16 DIAGNOSIS — S14109S Unspecified injury at unspecified level of cervical spinal cord, sequela: Secondary | ICD-10-CM | POA: Diagnosis not present

## 2019-08-16 DIAGNOSIS — Z8711 Personal history of peptic ulcer disease: Secondary | ICD-10-CM | POA: Diagnosis not present

## 2019-08-16 DIAGNOSIS — Z7982 Long term (current) use of aspirin: Secondary | ICD-10-CM | POA: Diagnosis not present

## 2019-08-16 DIAGNOSIS — K922 Gastrointestinal hemorrhage, unspecified: Secondary | ICD-10-CM | POA: Diagnosis not present

## 2019-08-16 DIAGNOSIS — K224 Dyskinesia of esophagus: Secondary | ICD-10-CM | POA: Diagnosis not present

## 2019-08-16 DIAGNOSIS — K589 Irritable bowel syndrome without diarrhea: Secondary | ICD-10-CM | POA: Diagnosis not present

## 2019-08-16 DIAGNOSIS — H2513 Age-related nuclear cataract, bilateral: Secondary | ICD-10-CM | POA: Diagnosis not present

## 2019-08-16 DIAGNOSIS — H25012 Cortical age-related cataract, left eye: Secondary | ICD-10-CM | POA: Diagnosis not present

## 2019-08-17 DIAGNOSIS — S0990XA Unspecified injury of head, initial encounter: Secondary | ICD-10-CM | POA: Diagnosis not present

## 2019-08-17 DIAGNOSIS — R530 Neoplastic (malignant) related fatigue: Secondary | ICD-10-CM | POA: Diagnosis not present

## 2019-08-17 DIAGNOSIS — E785 Hyperlipidemia, unspecified: Secondary | ICD-10-CM | POA: Diagnosis not present

## 2019-08-17 DIAGNOSIS — B9561 Methicillin susceptible Staphylococcus aureus infection as the cause of diseases classified elsewhere: Secondary | ICD-10-CM | POA: Diagnosis not present

## 2019-08-17 DIAGNOSIS — M4726 Other spondylosis with radiculopathy, lumbar region: Secondary | ICD-10-CM | POA: Diagnosis not present

## 2019-08-17 DIAGNOSIS — Z5181 Encounter for therapeutic drug level monitoring: Secondary | ICD-10-CM | POA: Diagnosis not present

## 2019-08-17 DIAGNOSIS — I82402 Acute embolism and thrombosis of unspecified deep veins of left lower extremity: Secondary | ICD-10-CM | POA: Diagnosis not present

## 2019-08-17 DIAGNOSIS — M4626 Osteomyelitis of vertebra, lumbar region: Secondary | ICD-10-CM | POA: Diagnosis not present

## 2019-08-17 DIAGNOSIS — R4182 Altered mental status, unspecified: Secondary | ICD-10-CM | POA: Diagnosis not present

## 2019-08-17 DIAGNOSIS — R609 Edema, unspecified: Secondary | ICD-10-CM | POA: Diagnosis not present

## 2019-08-17 DIAGNOSIS — I890 Lymphedema, not elsewhere classified: Secondary | ICD-10-CM | POA: Diagnosis not present

## 2019-08-17 DIAGNOSIS — C3492 Malignant neoplasm of unspecified part of left bronchus or lung: Secondary | ICD-10-CM | POA: Diagnosis not present

## 2019-08-17 DIAGNOSIS — E1169 Type 2 diabetes mellitus with other specified complication: Secondary | ICD-10-CM | POA: Diagnosis not present

## 2019-08-17 DIAGNOSIS — M79606 Pain in leg, unspecified: Secondary | ICD-10-CM | POA: Diagnosis not present

## 2019-08-17 DIAGNOSIS — Z452 Encounter for adjustment and management of vascular access device: Secondary | ICD-10-CM | POA: Diagnosis not present

## 2019-08-17 DIAGNOSIS — R51 Headache: Secondary | ICD-10-CM | POA: Diagnosis not present

## 2019-08-17 DIAGNOSIS — M19041 Primary osteoarthritis, right hand: Secondary | ICD-10-CM | POA: Diagnosis not present

## 2019-08-17 DIAGNOSIS — I10 Essential (primary) hypertension: Secondary | ICD-10-CM | POA: Diagnosis not present

## 2019-08-17 DIAGNOSIS — S22080S Wedge compression fracture of T11-T12 vertebra, sequela: Secondary | ICD-10-CM | POA: Diagnosis not present

## 2019-08-17 DIAGNOSIS — M48061 Spinal stenosis, lumbar region without neurogenic claudication: Secondary | ICD-10-CM | POA: Diagnosis not present

## 2019-08-17 DIAGNOSIS — Z792 Long term (current) use of antibiotics: Secondary | ICD-10-CM | POA: Diagnosis not present

## 2019-08-17 DIAGNOSIS — Z87891 Personal history of nicotine dependence: Secondary | ICD-10-CM | POA: Diagnosis not present

## 2019-08-17 DIAGNOSIS — M19042 Primary osteoarthritis, left hand: Secondary | ICD-10-CM | POA: Diagnosis not present

## 2019-08-18 DIAGNOSIS — J449 Chronic obstructive pulmonary disease, unspecified: Secondary | ICD-10-CM | POA: Diagnosis not present

## 2019-08-18 DIAGNOSIS — E11621 Type 2 diabetes mellitus with foot ulcer: Secondary | ICD-10-CM | POA: Diagnosis not present

## 2019-08-18 DIAGNOSIS — I69351 Hemiplegia and hemiparesis following cerebral infarction affecting right dominant side: Secondary | ICD-10-CM | POA: Diagnosis not present

## 2019-08-18 DIAGNOSIS — E1142 Type 2 diabetes mellitus with diabetic polyneuropathy: Secondary | ICD-10-CM | POA: Diagnosis not present

## 2019-08-18 DIAGNOSIS — R05 Cough: Secondary | ICD-10-CM | POA: Diagnosis not present

## 2019-08-18 DIAGNOSIS — M1A0710 Idiopathic chronic gout, right ankle and foot, without tophus (tophi): Secondary | ICD-10-CM | POA: Diagnosis not present

## 2019-08-18 DIAGNOSIS — M4802 Spinal stenosis, cervical region: Secondary | ICD-10-CM | POA: Diagnosis not present

## 2019-08-18 DIAGNOSIS — E785 Hyperlipidemia, unspecified: Secondary | ICD-10-CM | POA: Diagnosis not present

## 2019-08-18 DIAGNOSIS — M5030 Other cervical disc degeneration, unspecified cervical region: Secondary | ICD-10-CM | POA: Diagnosis not present

## 2019-08-18 DIAGNOSIS — Z9181 History of falling: Secondary | ICD-10-CM | POA: Diagnosis not present

## 2019-08-18 DIAGNOSIS — Z853 Personal history of malignant neoplasm of breast: Secondary | ICD-10-CM | POA: Diagnosis not present

## 2019-08-18 DIAGNOSIS — Z7982 Long term (current) use of aspirin: Secondary | ICD-10-CM | POA: Diagnosis not present

## 2019-08-18 DIAGNOSIS — I2510 Atherosclerotic heart disease of native coronary artery without angina pectoris: Secondary | ICD-10-CM | POA: Diagnosis not present

## 2019-08-18 DIAGNOSIS — D649 Anemia, unspecified: Secondary | ICD-10-CM | POA: Diagnosis not present

## 2019-08-18 DIAGNOSIS — K279 Peptic ulcer, site unspecified, unspecified as acute or chronic, without hemorrhage or perforation: Secondary | ICD-10-CM | POA: Diagnosis not present

## 2019-08-18 DIAGNOSIS — K219 Gastro-esophageal reflux disease without esophagitis: Secondary | ICD-10-CM | POA: Diagnosis not present

## 2019-08-18 DIAGNOSIS — E782 Mixed hyperlipidemia: Secondary | ICD-10-CM | POA: Diagnosis not present

## 2019-08-18 DIAGNOSIS — M170 Bilateral primary osteoarthritis of knee: Secondary | ICD-10-CM | POA: Diagnosis not present

## 2019-08-18 DIAGNOSIS — Z9189 Other specified personal risk factors, not elsewhere classified: Secondary | ICD-10-CM | POA: Diagnosis not present

## 2019-08-18 DIAGNOSIS — Z87891 Personal history of nicotine dependence: Secondary | ICD-10-CM | POA: Diagnosis not present

## 2019-08-18 DIAGNOSIS — L089 Local infection of the skin and subcutaneous tissue, unspecified: Secondary | ICD-10-CM | POA: Diagnosis not present

## 2019-08-18 DIAGNOSIS — I10 Essential (primary) hypertension: Secondary | ICD-10-CM | POA: Diagnosis not present

## 2019-08-18 DIAGNOSIS — L97521 Non-pressure chronic ulcer of other part of left foot limited to breakdown of skin: Secondary | ICD-10-CM | POA: Diagnosis not present

## 2019-08-18 DIAGNOSIS — F329 Major depressive disorder, single episode, unspecified: Secondary | ICD-10-CM | POA: Diagnosis not present

## 2019-08-18 DIAGNOSIS — Z9012 Acquired absence of left breast and nipple: Secondary | ICD-10-CM | POA: Diagnosis not present

## 2019-08-18 DIAGNOSIS — D1803 Hemangioma of intra-abdominal structures: Secondary | ICD-10-CM | POA: Diagnosis not present

## 2019-08-19 DIAGNOSIS — K5904 Chronic idiopathic constipation: Secondary | ICD-10-CM | POA: Diagnosis not present

## 2019-08-19 DIAGNOSIS — F329 Major depressive disorder, single episode, unspecified: Secondary | ICD-10-CM | POA: Diagnosis not present

## 2019-08-19 DIAGNOSIS — M542 Cervicalgia: Secondary | ICD-10-CM | POA: Diagnosis not present

## 2019-08-19 DIAGNOSIS — I272 Pulmonary hypertension, unspecified: Secondary | ICD-10-CM | POA: Diagnosis not present

## 2019-08-19 DIAGNOSIS — R339 Retention of urine, unspecified: Secondary | ICD-10-CM | POA: Diagnosis not present

## 2019-08-19 DIAGNOSIS — R338 Other retention of urine: Secondary | ICD-10-CM | POA: Diagnosis not present

## 2019-08-19 DIAGNOSIS — M62838 Other muscle spasm: Secondary | ICD-10-CM | POA: Diagnosis not present

## 2019-08-19 DIAGNOSIS — Z1211 Encounter for screening for malignant neoplasm of colon: Secondary | ICD-10-CM | POA: Diagnosis not present

## 2019-08-19 DIAGNOSIS — E1165 Type 2 diabetes mellitus with hyperglycemia: Secondary | ICD-10-CM | POA: Diagnosis not present

## 2019-08-19 DIAGNOSIS — I5033 Acute on chronic diastolic (congestive) heart failure: Secondary | ICD-10-CM | POA: Diagnosis not present

## 2019-08-19 DIAGNOSIS — N189 Chronic kidney disease, unspecified: Secondary | ICD-10-CM | POA: Diagnosis not present

## 2019-08-19 DIAGNOSIS — Z1331 Encounter for screening for depression: Secondary | ICD-10-CM | POA: Diagnosis not present

## 2019-08-19 DIAGNOSIS — M545 Low back pain: Secondary | ICD-10-CM | POA: Diagnosis not present

## 2019-08-19 DIAGNOSIS — J181 Lobar pneumonia, unspecified organism: Secondary | ICD-10-CM | POA: Diagnosis not present

## 2019-08-19 DIAGNOSIS — M40209 Unspecified kyphosis, site unspecified: Secondary | ICD-10-CM | POA: Diagnosis not present

## 2019-08-19 DIAGNOSIS — J90 Pleural effusion, not elsewhere classified: Secondary | ICD-10-CM | POA: Diagnosis not present

## 2019-08-19 DIAGNOSIS — I480 Paroxysmal atrial fibrillation: Secondary | ICD-10-CM | POA: Diagnosis not present

## 2019-08-19 DIAGNOSIS — F0280 Dementia in other diseases classified elsewhere without behavioral disturbance: Secondary | ICD-10-CM | POA: Diagnosis not present

## 2019-08-19 DIAGNOSIS — I714 Abdominal aortic aneurysm, without rupture: Secondary | ICD-10-CM | POA: Diagnosis not present

## 2019-08-19 DIAGNOSIS — F0390 Unspecified dementia without behavioral disturbance: Secondary | ICD-10-CM | POA: Diagnosis not present

## 2019-08-19 DIAGNOSIS — G8194 Hemiplegia, unspecified affecting left nondominant side: Secondary | ICD-10-CM | POA: Diagnosis not present

## 2019-08-19 DIAGNOSIS — R000 Tachycardia, unspecified: Secondary | ICD-10-CM | POA: Diagnosis not present

## 2019-08-19 DIAGNOSIS — M21372 Foot drop, left foot: Secondary | ICD-10-CM | POA: Diagnosis not present

## 2019-08-19 DIAGNOSIS — Z794 Long term (current) use of insulin: Secondary | ICD-10-CM | POA: Diagnosis not present

## 2019-08-19 DIAGNOSIS — D638 Anemia in other chronic diseases classified elsewhere: Secondary | ICD-10-CM | POA: Diagnosis not present

## 2019-08-19 DIAGNOSIS — I69318 Other symptoms and signs involving cognitive functions following cerebral infarction: Secondary | ICD-10-CM | POA: Diagnosis not present

## 2019-08-19 DIAGNOSIS — M5416 Radiculopathy, lumbar region: Secondary | ICD-10-CM | POA: Diagnosis not present

## 2019-08-19 DIAGNOSIS — C3432 Malignant neoplasm of lower lobe, left bronchus or lung: Secondary | ICD-10-CM | POA: Diagnosis not present

## 2019-08-19 DIAGNOSIS — M170 Bilateral primary osteoarthritis of knee: Secondary | ICD-10-CM | POA: Diagnosis not present

## 2019-08-19 DIAGNOSIS — R531 Weakness: Secondary | ICD-10-CM | POA: Diagnosis not present

## 2019-08-19 DIAGNOSIS — R899 Unspecified abnormal finding in specimens from other organs, systems and tissues: Secondary | ICD-10-CM | POA: Diagnosis not present

## 2019-08-19 DIAGNOSIS — Z48815 Encounter for surgical aftercare following surgery on the digestive system: Secondary | ICD-10-CM | POA: Diagnosis not present

## 2019-08-19 DIAGNOSIS — J9811 Atelectasis: Secondary | ICD-10-CM | POA: Diagnosis not present

## 2019-08-19 DIAGNOSIS — I2510 Atherosclerotic heart disease of native coronary artery without angina pectoris: Secondary | ICD-10-CM | POA: Diagnosis not present

## 2019-08-19 DIAGNOSIS — N400 Enlarged prostate without lower urinary tract symptoms: Secondary | ICD-10-CM | POA: Diagnosis not present

## 2019-08-19 DIAGNOSIS — Z1339 Encounter for screening examination for other mental health and behavioral disorders: Secondary | ICD-10-CM | POA: Diagnosis not present

## 2019-08-19 DIAGNOSIS — Z299 Encounter for prophylactic measures, unspecified: Secondary | ICD-10-CM | POA: Diagnosis not present

## 2019-08-19 DIAGNOSIS — Z6826 Body mass index (BMI) 26.0-26.9, adult: Secondary | ICD-10-CM | POA: Diagnosis not present

## 2019-08-19 DIAGNOSIS — D500 Iron deficiency anemia secondary to blood loss (chronic): Secondary | ICD-10-CM | POA: Diagnosis not present

## 2019-08-19 DIAGNOSIS — Z515 Encounter for palliative care: Secondary | ICD-10-CM | POA: Diagnosis not present

## 2019-08-19 DIAGNOSIS — N390 Urinary tract infection, site not specified: Secondary | ICD-10-CM | POA: Diagnosis not present

## 2019-08-19 DIAGNOSIS — I129 Hypertensive chronic kidney disease with stage 1 through stage 4 chronic kidney disease, or unspecified chronic kidney disease: Secondary | ICD-10-CM | POA: Diagnosis not present

## 2019-08-19 DIAGNOSIS — I69391 Dysphagia following cerebral infarction: Secondary | ICD-10-CM | POA: Diagnosis not present

## 2019-08-19 DIAGNOSIS — M06041 Rheumatoid arthritis without rheumatoid factor, right hand: Secondary | ICD-10-CM | POA: Diagnosis not present

## 2019-08-19 DIAGNOSIS — K5720 Diverticulitis of large intestine with perforation and abscess without bleeding: Secondary | ICD-10-CM | POA: Diagnosis not present

## 2019-08-19 DIAGNOSIS — K449 Diaphragmatic hernia without obstruction or gangrene: Secondary | ICD-10-CM | POA: Diagnosis not present

## 2019-08-19 DIAGNOSIS — K222 Esophageal obstruction: Secondary | ICD-10-CM | POA: Diagnosis not present

## 2019-08-19 DIAGNOSIS — G8929 Other chronic pain: Secondary | ICD-10-CM | POA: Diagnosis not present

## 2019-08-19 DIAGNOSIS — J9611 Chronic respiratory failure with hypoxia: Secondary | ICD-10-CM | POA: Diagnosis not present

## 2019-08-19 DIAGNOSIS — M9712XD Periprosthetic fracture around internal prosthetic left knee joint, subsequent encounter: Secondary | ICD-10-CM | POA: Diagnosis not present

## 2019-08-19 DIAGNOSIS — M1990 Unspecified osteoarthritis, unspecified site: Secondary | ICD-10-CM | POA: Diagnosis not present

## 2019-08-19 DIAGNOSIS — L89152 Pressure ulcer of sacral region, stage 2: Secondary | ICD-10-CM | POA: Diagnosis not present

## 2019-08-19 DIAGNOSIS — C159 Malignant neoplasm of esophagus, unspecified: Secondary | ICD-10-CM | POA: Diagnosis not present

## 2019-08-19 DIAGNOSIS — E1122 Type 2 diabetes mellitus with diabetic chronic kidney disease: Secondary | ICD-10-CM | POA: Diagnosis not present

## 2019-08-19 DIAGNOSIS — N401 Enlarged prostate with lower urinary tract symptoms: Secondary | ICD-10-CM | POA: Diagnosis not present

## 2019-08-19 DIAGNOSIS — D631 Anemia in chronic kidney disease: Secondary | ICD-10-CM | POA: Diagnosis not present

## 2019-08-19 DIAGNOSIS — C7931 Secondary malignant neoplasm of brain: Secondary | ICD-10-CM | POA: Diagnosis not present

## 2019-08-19 DIAGNOSIS — J449 Chronic obstructive pulmonary disease, unspecified: Secondary | ICD-10-CM | POA: Diagnosis not present

## 2019-08-19 DIAGNOSIS — J439 Emphysema, unspecified: Secondary | ICD-10-CM | POA: Diagnosis not present

## 2019-08-19 DIAGNOSIS — G309 Alzheimer's disease, unspecified: Secondary | ICD-10-CM | POA: Diagnosis not present

## 2019-08-19 DIAGNOSIS — G500 Trigeminal neuralgia: Secondary | ICD-10-CM | POA: Diagnosis not present

## 2019-08-19 DIAGNOSIS — M810 Age-related osteoporosis without current pathological fracture: Secondary | ICD-10-CM | POA: Diagnosis not present

## 2019-08-19 DIAGNOSIS — E222 Syndrome of inappropriate secretion of antidiuretic hormone: Secondary | ICD-10-CM | POA: Diagnosis not present

## 2019-08-19 DIAGNOSIS — M1A30X0 Chronic gout due to renal impairment, unspecified site, without tophus (tophi): Secondary | ICD-10-CM | POA: Diagnosis not present

## 2019-08-19 DIAGNOSIS — Z0000 Encounter for general adult medical examination without abnormal findings: Secondary | ICD-10-CM | POA: Diagnosis not present

## 2019-08-19 DIAGNOSIS — Z9181 History of falling: Secondary | ICD-10-CM | POA: Diagnosis not present

## 2019-08-19 DIAGNOSIS — M6281 Muscle weakness (generalized): Secondary | ICD-10-CM | POA: Diagnosis not present

## 2019-08-19 DIAGNOSIS — Z23 Encounter for immunization: Secondary | ICD-10-CM | POA: Diagnosis not present

## 2019-08-19 DIAGNOSIS — N183 Chronic kidney disease, stage 3 unspecified: Secondary | ICD-10-CM | POA: Diagnosis not present

## 2019-08-19 DIAGNOSIS — E039 Hypothyroidism, unspecified: Secondary | ICD-10-CM | POA: Diagnosis not present

## 2019-08-19 DIAGNOSIS — M79604 Pain in right leg: Secondary | ICD-10-CM | POA: Diagnosis not present

## 2019-08-19 DIAGNOSIS — M21941 Unspecified acquired deformity of hand, right hand: Secondary | ICD-10-CM | POA: Diagnosis not present

## 2019-08-19 DIAGNOSIS — I350 Nonrheumatic aortic (valve) stenosis: Secondary | ICD-10-CM | POA: Diagnosis not present

## 2019-08-19 DIAGNOSIS — E559 Vitamin D deficiency, unspecified: Secondary | ICD-10-CM | POA: Diagnosis not present

## 2019-08-19 DIAGNOSIS — K219 Gastro-esophageal reflux disease without esophagitis: Secondary | ICD-10-CM | POA: Diagnosis not present

## 2019-08-19 DIAGNOSIS — I10 Essential (primary) hypertension: Secondary | ICD-10-CM | POA: Diagnosis not present

## 2019-08-19 DIAGNOSIS — G894 Chronic pain syndrome: Secondary | ICD-10-CM | POA: Diagnosis not present

## 2019-08-19 DIAGNOSIS — E7800 Pure hypercholesterolemia, unspecified: Secondary | ICD-10-CM | POA: Diagnosis not present

## 2019-08-19 DIAGNOSIS — R0902 Hypoxemia: Secondary | ICD-10-CM | POA: Diagnosis not present

## 2019-08-19 DIAGNOSIS — D649 Anemia, unspecified: Secondary | ICD-10-CM | POA: Diagnosis not present

## 2019-08-19 DIAGNOSIS — Z7982 Long term (current) use of aspirin: Secondary | ICD-10-CM | POA: Diagnosis not present

## 2019-08-19 DIAGNOSIS — R809 Proteinuria, unspecified: Secondary | ICD-10-CM | POA: Diagnosis not present

## 2019-08-19 DIAGNOSIS — E43 Unspecified severe protein-calorie malnutrition: Secondary | ICD-10-CM | POA: Diagnosis not present

## 2019-08-19 DIAGNOSIS — S14109S Unspecified injury at unspecified level of cervical spinal cord, sequela: Secondary | ICD-10-CM | POA: Diagnosis not present

## 2019-08-19 DIAGNOSIS — B9620 Unspecified Escherichia coli [E. coli] as the cause of diseases classified elsewhere: Secondary | ICD-10-CM | POA: Diagnosis not present

## 2019-08-19 DIAGNOSIS — R1310 Dysphagia, unspecified: Secondary | ICD-10-CM | POA: Diagnosis not present

## 2019-08-19 DIAGNOSIS — S72452D Displaced supracondylar fracture without intracondylar extension of lower end of left femur, subsequent encounter for closed fracture with routine healing: Secondary | ICD-10-CM | POA: Diagnosis not present

## 2019-08-19 DIAGNOSIS — F0150 Vascular dementia without behavioral disturbance: Secondary | ICD-10-CM | POA: Diagnosis not present

## 2019-08-19 DIAGNOSIS — C3492 Malignant neoplasm of unspecified part of left bronchus or lung: Secondary | ICD-10-CM | POA: Diagnosis not present

## 2019-08-19 DIAGNOSIS — E119 Type 2 diabetes mellitus without complications: Secondary | ICD-10-CM | POA: Diagnosis not present

## 2019-08-19 DIAGNOSIS — Z7189 Other specified counseling: Secondary | ICD-10-CM | POA: Diagnosis not present

## 2019-08-19 DIAGNOSIS — E785 Hyperlipidemia, unspecified: Secondary | ICD-10-CM | POA: Diagnosis not present

## 2019-08-20 DIAGNOSIS — F1499 Cocaine use, unspecified with unspecified cocaine-induced disorder: Secondary | ICD-10-CM | POA: Insufficient documentation

## 2019-08-20 DIAGNOSIS — F1199 Opioid use, unspecified with unspecified opioid-induced disorder: Secondary | ICD-10-CM | POA: Insufficient documentation

## 2019-08-20 DIAGNOSIS — F319 Bipolar disorder, unspecified: Secondary | ICD-10-CM | POA: Insufficient documentation

## 2019-08-20 DIAGNOSIS — F3132 Bipolar disorder, current episode depressed, moderate: Secondary | ICD-10-CM | POA: Insufficient documentation

## 2019-08-20 DIAGNOSIS — F17200 Nicotine dependence, unspecified, uncomplicated: Secondary | ICD-10-CM | POA: Insufficient documentation

## 2019-08-20 DIAGNOSIS — J9611 Chronic respiratory failure with hypoxia: Secondary | ICD-10-CM | POA: Diagnosis not present

## 2019-08-20 DIAGNOSIS — Z79899 Other long term (current) drug therapy: Secondary | ICD-10-CM | POA: Diagnosis not present

## 2019-08-20 DIAGNOSIS — I272 Pulmonary hypertension, unspecified: Secondary | ICD-10-CM | POA: Diagnosis not present

## 2019-08-20 DIAGNOSIS — I10 Essential (primary) hypertension: Secondary | ICD-10-CM | POA: Diagnosis not present

## 2019-08-20 DIAGNOSIS — I252 Old myocardial infarction: Secondary | ICD-10-CM | POA: Diagnosis not present

## 2019-08-20 DIAGNOSIS — I482 Chronic atrial fibrillation, unspecified: Secondary | ICD-10-CM | POA: Diagnosis not present

## 2019-08-20 DIAGNOSIS — Z9181 History of falling: Secondary | ICD-10-CM | POA: Diagnosis not present

## 2019-08-20 DIAGNOSIS — N183 Chronic kidney disease, stage 3 unspecified: Secondary | ICD-10-CM | POA: Diagnosis not present

## 2019-08-20 DIAGNOSIS — I130 Hypertensive heart and chronic kidney disease with heart failure and stage 1 through stage 4 chronic kidney disease, or unspecified chronic kidney disease: Secondary | ICD-10-CM | POA: Diagnosis not present

## 2019-08-20 DIAGNOSIS — E1122 Type 2 diabetes mellitus with diabetic chronic kidney disease: Secondary | ICD-10-CM | POA: Diagnosis not present

## 2019-08-20 DIAGNOSIS — D649 Anemia, unspecified: Secondary | ICD-10-CM | POA: Diagnosis not present

## 2019-08-20 DIAGNOSIS — F329 Major depressive disorder, single episode, unspecified: Secondary | ICD-10-CM | POA: Diagnosis not present

## 2019-08-20 DIAGNOSIS — R188 Other ascites: Secondary | ICD-10-CM | POA: Diagnosis not present

## 2019-08-20 DIAGNOSIS — Z7982 Long term (current) use of aspirin: Secondary | ICD-10-CM | POA: Diagnosis not present

## 2019-08-20 DIAGNOSIS — J90 Pleural effusion, not elsewhere classified: Secondary | ICD-10-CM | POA: Diagnosis not present

## 2019-08-20 DIAGNOSIS — J440 Chronic obstructive pulmonary disease with acute lower respiratory infection: Secondary | ICD-10-CM | POA: Diagnosis not present

## 2019-08-20 DIAGNOSIS — F0391 Unspecified dementia with behavioral disturbance: Secondary | ICD-10-CM | POA: Diagnosis not present

## 2019-08-20 DIAGNOSIS — N179 Acute kidney failure, unspecified: Secondary | ICD-10-CM | POA: Diagnosis not present

## 2019-08-20 DIAGNOSIS — J189 Pneumonia, unspecified organism: Secondary | ICD-10-CM | POA: Diagnosis not present

## 2019-08-20 DIAGNOSIS — I25119 Atherosclerotic heart disease of native coronary artery with unspecified angina pectoris: Secondary | ICD-10-CM | POA: Diagnosis not present

## 2019-08-20 DIAGNOSIS — H02052 Trichiasis without entropian right lower eyelid: Secondary | ICD-10-CM | POA: Diagnosis not present

## 2019-08-20 DIAGNOSIS — I5042 Chronic combined systolic (congestive) and diastolic (congestive) heart failure: Secondary | ICD-10-CM | POA: Diagnosis not present

## 2019-08-20 DIAGNOSIS — F410 Panic disorder [episodic paroxysmal anxiety] without agoraphobia: Secondary | ICD-10-CM | POA: Diagnosis not present

## 2019-08-20 DIAGNOSIS — L03116 Cellulitis of left lower limb: Secondary | ICD-10-CM | POA: Diagnosis not present

## 2019-08-20 DIAGNOSIS — G9341 Metabolic encephalopathy: Secondary | ICD-10-CM | POA: Diagnosis not present

## 2019-08-20 DIAGNOSIS — J690 Pneumonitis due to inhalation of food and vomit: Secondary | ICD-10-CM | POA: Diagnosis not present

## 2019-08-20 DIAGNOSIS — N189 Chronic kidney disease, unspecified: Secondary | ICD-10-CM | POA: Diagnosis not present

## 2019-08-20 DIAGNOSIS — E876 Hypokalemia: Secondary | ICD-10-CM | POA: Diagnosis not present

## 2019-08-20 DIAGNOSIS — I5033 Acute on chronic diastolic (congestive) heart failure: Secondary | ICD-10-CM | POA: Diagnosis not present

## 2019-08-20 DIAGNOSIS — H9190 Unspecified hearing loss, unspecified ear: Secondary | ICD-10-CM | POA: Diagnosis not present

## 2019-08-20 DIAGNOSIS — N390 Urinary tract infection, site not specified: Secondary | ICD-10-CM | POA: Diagnosis not present

## 2019-08-21 DIAGNOSIS — I272 Pulmonary hypertension, unspecified: Secondary | ICD-10-CM | POA: Diagnosis not present

## 2019-08-21 DIAGNOSIS — I10 Essential (primary) hypertension: Secondary | ICD-10-CM | POA: Diagnosis not present

## 2019-08-21 DIAGNOSIS — J9611 Chronic respiratory failure with hypoxia: Secondary | ICD-10-CM | POA: Diagnosis not present

## 2019-08-21 DIAGNOSIS — I5033 Acute on chronic diastolic (congestive) heart failure: Secondary | ICD-10-CM | POA: Diagnosis not present

## 2019-08-22 DIAGNOSIS — E1151 Type 2 diabetes mellitus with diabetic peripheral angiopathy without gangrene: Secondary | ICD-10-CM | POA: Diagnosis not present

## 2019-08-22 DIAGNOSIS — R519 Headache, unspecified: Secondary | ICD-10-CM | POA: Diagnosis not present

## 2019-08-22 DIAGNOSIS — Z79899 Other long term (current) drug therapy: Secondary | ICD-10-CM | POA: Diagnosis not present

## 2019-08-22 DIAGNOSIS — J3489 Other specified disorders of nose and nasal sinuses: Secondary | ICD-10-CM | POA: Diagnosis not present

## 2019-08-22 DIAGNOSIS — H9201 Otalgia, right ear: Secondary | ICD-10-CM | POA: Diagnosis not present

## 2019-08-22 DIAGNOSIS — E1169 Type 2 diabetes mellitus with other specified complication: Secondary | ICD-10-CM | POA: Diagnosis not present

## 2019-08-22 DIAGNOSIS — E1142 Type 2 diabetes mellitus with diabetic polyneuropathy: Secondary | ICD-10-CM | POA: Diagnosis not present

## 2019-08-22 DIAGNOSIS — Z7901 Long term (current) use of anticoagulants: Secondary | ICD-10-CM | POA: Diagnosis not present

## 2019-08-22 DIAGNOSIS — E1122 Type 2 diabetes mellitus with diabetic chronic kidney disease: Secondary | ICD-10-CM | POA: Diagnosis not present

## 2019-08-22 DIAGNOSIS — I5032 Chronic diastolic (congestive) heart failure: Secondary | ICD-10-CM | POA: Diagnosis not present

## 2019-08-22 DIAGNOSIS — E785 Hyperlipidemia, unspecified: Secondary | ICD-10-CM | POA: Diagnosis not present

## 2019-08-22 DIAGNOSIS — R200 Anesthesia of skin: Secondary | ICD-10-CM | POA: Diagnosis not present

## 2019-08-22 DIAGNOSIS — R5381 Other malaise: Secondary | ICD-10-CM | POA: Diagnosis not present

## 2019-08-22 DIAGNOSIS — N1832 Chronic kidney disease, stage 3b: Secondary | ICD-10-CM | POA: Diagnosis not present

## 2019-08-22 DIAGNOSIS — I130 Hypertensive heart and chronic kidney disease with heart failure and stage 1 through stage 4 chronic kidney disease, or unspecified chronic kidney disease: Secondary | ICD-10-CM | POA: Diagnosis not present

## 2019-08-22 DIAGNOSIS — Z6841 Body Mass Index (BMI) 40.0 and over, adult: Secondary | ICD-10-CM | POA: Diagnosis not present

## 2019-08-22 DIAGNOSIS — I2510 Atherosclerotic heart disease of native coronary artery without angina pectoris: Secondary | ICD-10-CM | POA: Diagnosis not present

## 2019-08-22 DIAGNOSIS — Z87891 Personal history of nicotine dependence: Secondary | ICD-10-CM | POA: Diagnosis not present

## 2019-08-22 DIAGNOSIS — I6782 Cerebral ischemia: Secondary | ICD-10-CM | POA: Diagnosis not present

## 2019-08-23 DIAGNOSIS — E559 Vitamin D deficiency, unspecified: Secondary | ICD-10-CM | POA: Diagnosis not present

## 2019-08-23 DIAGNOSIS — E782 Mixed hyperlipidemia: Secondary | ICD-10-CM | POA: Diagnosis not present

## 2019-08-23 DIAGNOSIS — Z720 Tobacco use: Secondary | ICD-10-CM | POA: Diagnosis not present

## 2019-08-23 DIAGNOSIS — R531 Weakness: Secondary | ICD-10-CM | POA: Diagnosis not present

## 2019-08-23 DIAGNOSIS — J449 Chronic obstructive pulmonary disease, unspecified: Secondary | ICD-10-CM | POA: Diagnosis not present

## 2019-08-23 DIAGNOSIS — D649 Anemia, unspecified: Secondary | ICD-10-CM | POA: Diagnosis not present

## 2019-08-23 DIAGNOSIS — E119 Type 2 diabetes mellitus without complications: Secondary | ICD-10-CM | POA: Diagnosis not present

## 2019-08-23 DIAGNOSIS — M8580 Other specified disorders of bone density and structure, unspecified site: Secondary | ICD-10-CM | POA: Diagnosis not present

## 2019-08-23 DIAGNOSIS — Z1231 Encounter for screening mammogram for malignant neoplasm of breast: Secondary | ICD-10-CM | POA: Diagnosis not present

## 2019-08-23 DIAGNOSIS — N179 Acute kidney failure, unspecified: Secondary | ICD-10-CM | POA: Diagnosis not present

## 2019-08-23 DIAGNOSIS — Z20828 Contact with and (suspected) exposure to other viral communicable diseases: Secondary | ICD-10-CM | POA: Diagnosis not present

## 2019-08-24 DIAGNOSIS — L97521 Non-pressure chronic ulcer of other part of left foot limited to breakdown of skin: Secondary | ICD-10-CM | POA: Diagnosis not present

## 2019-08-24 DIAGNOSIS — M150 Primary generalized (osteo)arthritis: Secondary | ICD-10-CM | POA: Diagnosis not present

## 2019-08-24 DIAGNOSIS — R531 Weakness: Secondary | ICD-10-CM | POA: Diagnosis not present

## 2019-08-24 DIAGNOSIS — E785 Hyperlipidemia, unspecified: Secondary | ICD-10-CM | POA: Diagnosis not present

## 2019-08-24 DIAGNOSIS — S82142D Displaced bicondylar fracture of left tibia, subsequent encounter for closed fracture with routine healing: Secondary | ICD-10-CM | POA: Diagnosis not present

## 2019-08-24 DIAGNOSIS — M1711 Unilateral primary osteoarthritis, right knee: Secondary | ICD-10-CM | POA: Diagnosis not present

## 2019-08-24 DIAGNOSIS — R262 Difficulty in walking, not elsewhere classified: Secondary | ICD-10-CM | POA: Diagnosis not present

## 2019-08-24 DIAGNOSIS — L089 Local infection of the skin and subcutaneous tissue, unspecified: Secondary | ICD-10-CM | POA: Diagnosis not present

## 2019-08-24 DIAGNOSIS — D649 Anemia, unspecified: Secondary | ICD-10-CM | POA: Diagnosis not present

## 2019-08-24 DIAGNOSIS — M479 Spondylosis, unspecified: Secondary | ICD-10-CM | POA: Diagnosis not present

## 2019-08-24 DIAGNOSIS — S72002A Fracture of unspecified part of neck of left femur, initial encounter for closed fracture: Secondary | ICD-10-CM | POA: Diagnosis not present

## 2019-08-24 DIAGNOSIS — R778 Other specified abnormalities of plasma proteins: Secondary | ICD-10-CM | POA: Diagnosis not present

## 2019-08-24 DIAGNOSIS — Z9181 History of falling: Secondary | ICD-10-CM | POA: Diagnosis not present

## 2019-08-24 DIAGNOSIS — Z7982 Long term (current) use of aspirin: Secondary | ICD-10-CM | POA: Diagnosis not present

## 2019-08-24 DIAGNOSIS — K219 Gastro-esophageal reflux disease without esophagitis: Secondary | ICD-10-CM | POA: Diagnosis not present

## 2019-08-24 DIAGNOSIS — E11621 Type 2 diabetes mellitus with foot ulcer: Secondary | ICD-10-CM | POA: Diagnosis not present

## 2019-08-24 DIAGNOSIS — M4800 Spinal stenosis, site unspecified: Secondary | ICD-10-CM | POA: Diagnosis not present

## 2019-08-24 DIAGNOSIS — I4891 Unspecified atrial fibrillation: Secondary | ICD-10-CM | POA: Diagnosis not present

## 2019-08-24 DIAGNOSIS — N189 Chronic kidney disease, unspecified: Secondary | ICD-10-CM | POA: Diagnosis not present

## 2019-08-24 DIAGNOSIS — I10 Essential (primary) hypertension: Secondary | ICD-10-CM | POA: Diagnosis not present

## 2019-08-24 DIAGNOSIS — N179 Acute kidney failure, unspecified: Secondary | ICD-10-CM | POA: Diagnosis not present

## 2019-08-24 DIAGNOSIS — E860 Dehydration: Secondary | ICD-10-CM | POA: Diagnosis not present

## 2019-08-24 DIAGNOSIS — Z20828 Contact with and (suspected) exposure to other viral communicable diseases: Secondary | ICD-10-CM | POA: Diagnosis not present

## 2019-08-24 DIAGNOSIS — M1A0710 Idiopathic chronic gout, right ankle and foot, without tophus (tophi): Secondary | ICD-10-CM | POA: Diagnosis not present

## 2019-08-24 DIAGNOSIS — E1142 Type 2 diabetes mellitus with diabetic polyneuropathy: Secondary | ICD-10-CM | POA: Diagnosis not present

## 2019-08-24 DIAGNOSIS — N390 Urinary tract infection, site not specified: Secondary | ICD-10-CM | POA: Diagnosis not present

## 2019-08-24 DIAGNOSIS — M419 Scoliosis, unspecified: Secondary | ICD-10-CM | POA: Diagnosis not present

## 2019-08-24 DIAGNOSIS — J479 Bronchiectasis, uncomplicated: Secondary | ICD-10-CM | POA: Diagnosis not present

## 2019-08-24 DIAGNOSIS — Z87891 Personal history of nicotine dependence: Secondary | ICD-10-CM | POA: Diagnosis not present

## 2019-08-24 DIAGNOSIS — E119 Type 2 diabetes mellitus without complications: Secondary | ICD-10-CM | POA: Diagnosis not present

## 2019-08-24 DIAGNOSIS — I2510 Atherosclerotic heart disease of native coronary artery without angina pectoris: Secondary | ICD-10-CM | POA: Diagnosis not present

## 2019-08-25 DIAGNOSIS — Z7189 Other specified counseling: Secondary | ICD-10-CM | POA: Diagnosis not present

## 2019-08-25 DIAGNOSIS — C50911 Malignant neoplasm of unspecified site of right female breast: Secondary | ICD-10-CM | POA: Diagnosis not present

## 2019-08-25 DIAGNOSIS — Z6829 Body mass index (BMI) 29.0-29.9, adult: Secondary | ICD-10-CM | POA: Diagnosis not present

## 2019-08-25 DIAGNOSIS — R262 Difficulty in walking, not elsewhere classified: Secondary | ICD-10-CM | POA: Diagnosis not present

## 2019-08-25 DIAGNOSIS — N390 Urinary tract infection, site not specified: Secondary | ICD-10-CM | POA: Diagnosis not present

## 2019-08-25 DIAGNOSIS — I5030 Unspecified diastolic (congestive) heart failure: Secondary | ICD-10-CM | POA: Diagnosis not present

## 2019-08-25 DIAGNOSIS — M170 Bilateral primary osteoarthritis of knee: Secondary | ICD-10-CM | POA: Diagnosis not present

## 2019-08-25 DIAGNOSIS — R778 Other specified abnormalities of plasma proteins: Secondary | ICD-10-CM | POA: Diagnosis not present

## 2019-08-25 DIAGNOSIS — S72002A Fracture of unspecified part of neck of left femur, initial encounter for closed fracture: Secondary | ICD-10-CM | POA: Diagnosis not present

## 2019-08-25 DIAGNOSIS — Z0000 Encounter for general adult medical examination without abnormal findings: Secondary | ICD-10-CM | POA: Diagnosis not present

## 2019-08-25 DIAGNOSIS — E860 Dehydration: Secondary | ICD-10-CM | POA: Diagnosis not present

## 2019-08-25 DIAGNOSIS — D649 Anemia, unspecified: Secondary | ICD-10-CM | POA: Diagnosis not present

## 2019-08-25 DIAGNOSIS — K921 Melena: Secondary | ICD-10-CM | POA: Diagnosis not present

## 2019-08-25 DIAGNOSIS — R5383 Other fatigue: Secondary | ICD-10-CM | POA: Diagnosis not present

## 2019-08-25 DIAGNOSIS — Z20828 Contact with and (suspected) exposure to other viral communicable diseases: Secondary | ICD-10-CM | POA: Diagnosis not present

## 2019-08-25 DIAGNOSIS — F419 Anxiety disorder, unspecified: Secondary | ICD-10-CM | POA: Diagnosis not present

## 2019-08-25 DIAGNOSIS — Z299 Encounter for prophylactic measures, unspecified: Secondary | ICD-10-CM | POA: Diagnosis not present

## 2019-08-25 DIAGNOSIS — I4891 Unspecified atrial fibrillation: Secondary | ICD-10-CM | POA: Diagnosis not present

## 2019-08-25 DIAGNOSIS — Z1331 Encounter for screening for depression: Secondary | ICD-10-CM | POA: Diagnosis not present

## 2019-08-25 DIAGNOSIS — Z1339 Encounter for screening examination for other mental health and behavioral disorders: Secondary | ICD-10-CM | POA: Diagnosis not present

## 2019-08-25 DIAGNOSIS — E119 Type 2 diabetes mellitus without complications: Secondary | ICD-10-CM | POA: Diagnosis not present

## 2019-08-25 DIAGNOSIS — E7800 Pure hypercholesterolemia, unspecified: Secondary | ICD-10-CM | POA: Diagnosis not present

## 2019-08-25 DIAGNOSIS — G4733 Obstructive sleep apnea (adult) (pediatric): Secondary | ICD-10-CM | POA: Diagnosis not present

## 2019-08-25 DIAGNOSIS — I10 Essential (primary) hypertension: Secondary | ICD-10-CM | POA: Diagnosis not present

## 2019-08-26 DIAGNOSIS — J9610 Chronic respiratory failure, unspecified whether with hypoxia or hypercapnia: Secondary | ICD-10-CM | POA: Diagnosis not present

## 2019-08-26 DIAGNOSIS — Z6830 Body mass index (BMI) 30.0-30.9, adult: Secondary | ICD-10-CM | POA: Diagnosis not present

## 2019-08-26 DIAGNOSIS — Z1331 Encounter for screening for depression: Secondary | ICD-10-CM | POA: Diagnosis not present

## 2019-08-26 DIAGNOSIS — M545 Low back pain: Secondary | ICD-10-CM | POA: Diagnosis not present

## 2019-08-26 DIAGNOSIS — Z0000 Encounter for general adult medical examination without abnormal findings: Secondary | ICD-10-CM | POA: Diagnosis not present

## 2019-08-26 DIAGNOSIS — R339 Retention of urine, unspecified: Secondary | ICD-10-CM | POA: Diagnosis not present

## 2019-08-26 DIAGNOSIS — M80031D Age-related osteoporosis with current pathological fracture, right forearm, subsequent encounter for fracture with routine healing: Secondary | ICD-10-CM | POA: Diagnosis not present

## 2019-08-26 DIAGNOSIS — N401 Enlarged prostate with lower urinary tract symptoms: Secondary | ICD-10-CM | POA: Diagnosis not present

## 2019-08-26 DIAGNOSIS — J189 Pneumonia, unspecified organism: Secondary | ICD-10-CM | POA: Diagnosis not present

## 2019-08-26 DIAGNOSIS — H5203 Hypermetropia, bilateral: Secondary | ICD-10-CM | POA: Diagnosis not present

## 2019-08-26 DIAGNOSIS — Z299 Encounter for prophylactic measures, unspecified: Secondary | ICD-10-CM | POA: Diagnosis not present

## 2019-08-26 DIAGNOSIS — Z9181 History of falling: Secondary | ICD-10-CM | POA: Diagnosis not present

## 2019-08-26 DIAGNOSIS — E7800 Pure hypercholesterolemia, unspecified: Secondary | ICD-10-CM | POA: Diagnosis not present

## 2019-08-26 DIAGNOSIS — I10 Essential (primary) hypertension: Secondary | ICD-10-CM | POA: Diagnosis not present

## 2019-08-26 DIAGNOSIS — G4700 Insomnia, unspecified: Secondary | ICD-10-CM | POA: Diagnosis not present

## 2019-08-26 DIAGNOSIS — M1990 Unspecified osteoarthritis, unspecified site: Secondary | ICD-10-CM | POA: Diagnosis not present

## 2019-08-26 DIAGNOSIS — R5383 Other fatigue: Secondary | ICD-10-CM | POA: Diagnosis not present

## 2019-08-26 DIAGNOSIS — S72001A Fracture of unspecified part of neck of right femur, initial encounter for closed fracture: Secondary | ICD-10-CM | POA: Diagnosis not present

## 2019-08-26 DIAGNOSIS — R350 Frequency of micturition: Secondary | ICD-10-CM | POA: Diagnosis not present

## 2019-08-26 DIAGNOSIS — Z20828 Contact with and (suspected) exposure to other viral communicable diseases: Secondary | ICD-10-CM | POA: Diagnosis not present

## 2019-08-26 DIAGNOSIS — H524 Presbyopia: Secondary | ICD-10-CM | POA: Diagnosis not present

## 2019-08-26 DIAGNOSIS — S32010A Wedge compression fracture of first lumbar vertebra, initial encounter for closed fracture: Secondary | ICD-10-CM | POA: Diagnosis not present

## 2019-08-26 DIAGNOSIS — J432 Centrilobular emphysema: Secondary | ICD-10-CM | POA: Diagnosis not present

## 2019-08-26 DIAGNOSIS — Z7189 Other specified counseling: Secondary | ICD-10-CM | POA: Diagnosis not present

## 2019-08-26 DIAGNOSIS — E1122 Type 2 diabetes mellitus with diabetic chronic kidney disease: Secondary | ICD-10-CM | POA: Diagnosis not present

## 2019-08-26 DIAGNOSIS — D62 Acute posthemorrhagic anemia: Secondary | ICD-10-CM | POA: Diagnosis not present

## 2019-08-26 DIAGNOSIS — J449 Chronic obstructive pulmonary disease, unspecified: Secondary | ICD-10-CM | POA: Diagnosis not present

## 2019-08-26 DIAGNOSIS — F411 Generalized anxiety disorder: Secondary | ICD-10-CM | POA: Diagnosis not present

## 2019-08-26 DIAGNOSIS — Z1339 Encounter for screening examination for other mental health and behavioral disorders: Secondary | ICD-10-CM | POA: Diagnosis not present

## 2019-08-26 DIAGNOSIS — F331 Major depressive disorder, recurrent, moderate: Secondary | ICD-10-CM | POA: Diagnosis not present

## 2019-08-26 DIAGNOSIS — K5730 Diverticulosis of large intestine without perforation or abscess without bleeding: Secondary | ICD-10-CM | POA: Diagnosis not present

## 2019-08-26 DIAGNOSIS — Z87891 Personal history of nicotine dependence: Secondary | ICD-10-CM | POA: Diagnosis not present

## 2019-08-26 DIAGNOSIS — M6281 Muscle weakness (generalized): Secondary | ICD-10-CM | POA: Diagnosis not present

## 2019-08-26 DIAGNOSIS — N183 Chronic kidney disease, stage 3 unspecified: Secondary | ICD-10-CM | POA: Diagnosis not present

## 2019-08-26 DIAGNOSIS — K219 Gastro-esophageal reflux disease without esophagitis: Secondary | ICD-10-CM | POA: Diagnosis not present

## 2019-08-26 DIAGNOSIS — C099 Malignant neoplasm of tonsil, unspecified: Secondary | ICD-10-CM | POA: Diagnosis not present

## 2019-08-26 DIAGNOSIS — Z9350 Unspecified cystostomy status: Secondary | ICD-10-CM | POA: Diagnosis not present

## 2019-08-26 DIAGNOSIS — G4733 Obstructive sleep apnea (adult) (pediatric): Secondary | ICD-10-CM | POA: Diagnosis not present

## 2019-08-26 DIAGNOSIS — G250 Essential tremor: Secondary | ICD-10-CM | POA: Diagnosis not present

## 2019-08-26 DIAGNOSIS — D510 Vitamin B12 deficiency anemia due to intrinsic factor deficiency: Secondary | ICD-10-CM | POA: Diagnosis not present

## 2019-08-26 DIAGNOSIS — I129 Hypertensive chronic kidney disease with stage 1 through stage 4 chronic kidney disease, or unspecified chronic kidney disease: Secondary | ICD-10-CM | POA: Diagnosis not present

## 2019-08-26 DIAGNOSIS — I4891 Unspecified atrial fibrillation: Secondary | ICD-10-CM | POA: Diagnosis not present

## 2019-08-26 DIAGNOSIS — E876 Hypokalemia: Secondary | ICD-10-CM | POA: Diagnosis not present

## 2019-08-26 DIAGNOSIS — Z79899 Other long term (current) drug therapy: Secondary | ICD-10-CM | POA: Diagnosis not present

## 2019-08-26 DIAGNOSIS — Z1211 Encounter for screening for malignant neoplasm of colon: Secondary | ICD-10-CM | POA: Diagnosis not present

## 2019-08-26 DIAGNOSIS — E039 Hypothyroidism, unspecified: Secondary | ICD-10-CM | POA: Diagnosis not present

## 2019-08-27 DIAGNOSIS — N183 Chronic kidney disease, stage 3 unspecified: Secondary | ICD-10-CM | POA: Diagnosis not present

## 2019-08-27 DIAGNOSIS — I2510 Atherosclerotic heart disease of native coronary artery without angina pectoris: Secondary | ICD-10-CM | POA: Diagnosis not present

## 2019-08-27 DIAGNOSIS — S32010A Wedge compression fracture of first lumbar vertebra, initial encounter for closed fracture: Secondary | ICD-10-CM | POA: Diagnosis not present

## 2019-08-27 DIAGNOSIS — Z9181 History of falling: Secondary | ICD-10-CM | POA: Diagnosis not present

## 2019-08-27 DIAGNOSIS — M1611 Unilateral primary osteoarthritis, right hip: Secondary | ICD-10-CM | POA: Diagnosis not present

## 2019-08-27 DIAGNOSIS — Z20828 Contact with and (suspected) exposure to other viral communicable diseases: Secondary | ICD-10-CM | POA: Diagnosis not present

## 2019-08-27 DIAGNOSIS — R1310 Dysphagia, unspecified: Secondary | ICD-10-CM | POA: Diagnosis not present

## 2019-08-27 DIAGNOSIS — D519 Vitamin B12 deficiency anemia, unspecified: Secondary | ICD-10-CM | POA: Diagnosis not present

## 2019-08-27 DIAGNOSIS — C7951 Secondary malignant neoplasm of bone: Secondary | ICD-10-CM | POA: Diagnosis not present

## 2019-08-27 DIAGNOSIS — E871 Hypo-osmolality and hyponatremia: Secondary | ICD-10-CM | POA: Diagnosis not present

## 2019-08-27 DIAGNOSIS — R7303 Prediabetes: Secondary | ICD-10-CM | POA: Diagnosis not present

## 2019-08-27 DIAGNOSIS — I110 Hypertensive heart disease with heart failure: Secondary | ICD-10-CM | POA: Diagnosis not present

## 2019-08-27 DIAGNOSIS — G20 Parkinson's disease: Secondary | ICD-10-CM | POA: Diagnosis not present

## 2019-08-27 DIAGNOSIS — D62 Acute posthemorrhagic anemia: Secondary | ICD-10-CM | POA: Diagnosis not present

## 2019-08-27 DIAGNOSIS — I10 Essential (primary) hypertension: Secondary | ICD-10-CM | POA: Diagnosis not present

## 2019-08-27 DIAGNOSIS — F419 Anxiety disorder, unspecified: Secondary | ICD-10-CM | POA: Diagnosis not present

## 2019-08-27 DIAGNOSIS — I493 Ventricular premature depolarization: Secondary | ICD-10-CM | POA: Diagnosis not present

## 2019-08-27 DIAGNOSIS — I255 Ischemic cardiomyopathy: Secondary | ICD-10-CM | POA: Diagnosis not present

## 2019-08-27 DIAGNOSIS — Z981 Arthrodesis status: Secondary | ICD-10-CM | POA: Diagnosis not present

## 2019-08-27 DIAGNOSIS — Z125 Encounter for screening for malignant neoplasm of prostate: Secondary | ICD-10-CM | POA: Diagnosis not present

## 2019-08-27 DIAGNOSIS — E559 Vitamin D deficiency, unspecified: Secondary | ICD-10-CM | POA: Diagnosis not present

## 2019-08-27 DIAGNOSIS — J9601 Acute respiratory failure with hypoxia: Secondary | ICD-10-CM | POA: Diagnosis not present

## 2019-08-27 DIAGNOSIS — R7301 Impaired fasting glucose: Secondary | ICD-10-CM | POA: Diagnosis not present

## 2019-08-27 DIAGNOSIS — S72001A Fracture of unspecified part of neck of right femur, initial encounter for closed fracture: Secondary | ICD-10-CM | POA: Diagnosis not present

## 2019-08-27 DIAGNOSIS — I5021 Acute systolic (congestive) heart failure: Secondary | ICD-10-CM | POA: Diagnosis not present

## 2019-08-27 DIAGNOSIS — H18593 Other hereditary corneal dystrophies, bilateral: Secondary | ICD-10-CM | POA: Diagnosis not present

## 2019-08-27 DIAGNOSIS — E782 Mixed hyperlipidemia: Secondary | ICD-10-CM | POA: Diagnosis not present

## 2019-08-27 DIAGNOSIS — Z7951 Long term (current) use of inhaled steroids: Secondary | ICD-10-CM | POA: Diagnosis not present

## 2019-08-27 DIAGNOSIS — M069 Rheumatoid arthritis, unspecified: Secondary | ICD-10-CM | POA: Diagnosis not present

## 2019-08-27 DIAGNOSIS — H401331 Pigmentary glaucoma, bilateral, mild stage: Secondary | ICD-10-CM | POA: Diagnosis not present

## 2019-08-27 DIAGNOSIS — E785 Hyperlipidemia, unspecified: Secondary | ICD-10-CM | POA: Diagnosis not present

## 2019-08-27 DIAGNOSIS — I4891 Unspecified atrial fibrillation: Secondary | ICD-10-CM | POA: Diagnosis not present

## 2019-08-27 DIAGNOSIS — C61 Malignant neoplasm of prostate: Secondary | ICD-10-CM | POA: Diagnosis not present

## 2019-08-27 DIAGNOSIS — N401 Enlarged prostate with lower urinary tract symptoms: Secondary | ICD-10-CM | POA: Diagnosis not present

## 2019-08-27 DIAGNOSIS — M533 Sacrococcygeal disorders, not elsewhere classified: Secondary | ICD-10-CM | POA: Diagnosis not present

## 2019-08-27 DIAGNOSIS — Z1329 Encounter for screening for other suspected endocrine disorder: Secondary | ICD-10-CM | POA: Diagnosis not present

## 2019-08-27 DIAGNOSIS — M5416 Radiculopathy, lumbar region: Secondary | ICD-10-CM | POA: Diagnosis not present

## 2019-08-27 DIAGNOSIS — E119 Type 2 diabetes mellitus without complications: Secondary | ICD-10-CM | POA: Diagnosis not present

## 2019-08-27 DIAGNOSIS — J189 Pneumonia, unspecified organism: Secondary | ICD-10-CM | POA: Diagnosis not present

## 2019-08-27 DIAGNOSIS — Z7984 Long term (current) use of oral hypoglycemic drugs: Secondary | ICD-10-CM | POA: Diagnosis not present

## 2019-08-27 DIAGNOSIS — E1140 Type 2 diabetes mellitus with diabetic neuropathy, unspecified: Secondary | ICD-10-CM | POA: Diagnosis not present

## 2019-08-27 DIAGNOSIS — Z7982 Long term (current) use of aspirin: Secondary | ICD-10-CM | POA: Diagnosis not present

## 2019-08-27 DIAGNOSIS — Z79899 Other long term (current) drug therapy: Secondary | ICD-10-CM | POA: Diagnosis not present

## 2019-08-27 DIAGNOSIS — H04123 Dry eye syndrome of bilateral lacrimal glands: Secondary | ICD-10-CM | POA: Diagnosis not present

## 2019-08-28 DIAGNOSIS — J189 Pneumonia, unspecified organism: Secondary | ICD-10-CM | POA: Diagnosis not present

## 2019-08-28 DIAGNOSIS — Z471 Aftercare following joint replacement surgery: Secondary | ICD-10-CM | POA: Diagnosis not present

## 2019-08-28 DIAGNOSIS — E1165 Type 2 diabetes mellitus with hyperglycemia: Secondary | ICD-10-CM | POA: Diagnosis not present

## 2019-08-28 DIAGNOSIS — F0280 Dementia in other diseases classified elsewhere without behavioral disturbance: Secondary | ICD-10-CM | POA: Diagnosis not present

## 2019-08-28 DIAGNOSIS — H9190 Unspecified hearing loss, unspecified ear: Secondary | ICD-10-CM | POA: Diagnosis not present

## 2019-08-28 DIAGNOSIS — K567 Ileus, unspecified: Secondary | ICD-10-CM | POA: Diagnosis not present

## 2019-08-28 DIAGNOSIS — E1169 Type 2 diabetes mellitus with other specified complication: Secondary | ICD-10-CM | POA: Diagnosis not present

## 2019-08-28 DIAGNOSIS — Z5309 Procedure and treatment not carried out because of other contraindication: Secondary | ICD-10-CM | POA: Diagnosis not present

## 2019-08-28 DIAGNOSIS — E8770 Fluid overload, unspecified: Secondary | ICD-10-CM | POA: Diagnosis not present

## 2019-08-28 DIAGNOSIS — Z79899 Other long term (current) drug therapy: Secondary | ICD-10-CM | POA: Diagnosis not present

## 2019-08-28 DIAGNOSIS — K660 Peritoneal adhesions (postprocedural) (postinfection): Secondary | ICD-10-CM | POA: Diagnosis not present

## 2019-08-28 DIAGNOSIS — K219 Gastro-esophageal reflux disease without esophagitis: Secondary | ICD-10-CM | POA: Diagnosis not present

## 2019-08-28 DIAGNOSIS — Z01812 Encounter for preprocedural laboratory examination: Secondary | ICD-10-CM | POA: Diagnosis not present

## 2019-08-28 DIAGNOSIS — I480 Paroxysmal atrial fibrillation: Secondary | ICD-10-CM | POA: Diagnosis not present

## 2019-08-28 DIAGNOSIS — G309 Alzheimer's disease, unspecified: Secondary | ICD-10-CM | POA: Diagnosis not present

## 2019-08-28 DIAGNOSIS — Z9181 History of falling: Secondary | ICD-10-CM | POA: Diagnosis not present

## 2019-08-28 DIAGNOSIS — K651 Peritoneal abscess: Secondary | ICD-10-CM | POA: Diagnosis not present

## 2019-08-28 DIAGNOSIS — Z8546 Personal history of malignant neoplasm of prostate: Secondary | ICD-10-CM | POA: Diagnosis not present

## 2019-08-28 DIAGNOSIS — E785 Hyperlipidemia, unspecified: Secondary | ICD-10-CM | POA: Diagnosis not present

## 2019-08-28 DIAGNOSIS — Z96651 Presence of right artificial knee joint: Secondary | ICD-10-CM | POA: Diagnosis not present

## 2019-08-28 DIAGNOSIS — K668 Other specified disorders of peritoneum: Secondary | ICD-10-CM | POA: Diagnosis not present

## 2019-08-28 DIAGNOSIS — R1310 Dysphagia, unspecified: Secondary | ICD-10-CM | POA: Diagnosis not present

## 2019-08-28 DIAGNOSIS — B954 Other streptococcus as the cause of diseases classified elsewhere: Secondary | ICD-10-CM | POA: Diagnosis not present

## 2019-08-28 DIAGNOSIS — K630 Abscess of intestine: Secondary | ICD-10-CM | POA: Diagnosis not present

## 2019-08-28 DIAGNOSIS — Z7982 Long term (current) use of aspirin: Secondary | ICD-10-CM | POA: Diagnosis not present

## 2019-08-28 DIAGNOSIS — M545 Low back pain: Secondary | ICD-10-CM | POA: Diagnosis not present

## 2019-08-28 DIAGNOSIS — E039 Hypothyroidism, unspecified: Secondary | ICD-10-CM | POA: Diagnosis not present

## 2019-08-28 DIAGNOSIS — Z85038 Personal history of other malignant neoplasm of large intestine: Secondary | ICD-10-CM | POA: Diagnosis not present

## 2019-08-28 DIAGNOSIS — Z20828 Contact with and (suspected) exposure to other viral communicable diseases: Secondary | ICD-10-CM | POA: Diagnosis not present

## 2019-08-28 DIAGNOSIS — S32010A Wedge compression fracture of first lumbar vertebra, initial encounter for closed fracture: Secondary | ICD-10-CM | POA: Diagnosis not present

## 2019-08-28 DIAGNOSIS — I4891 Unspecified atrial fibrillation: Secondary | ICD-10-CM | POA: Diagnosis not present

## 2019-08-28 DIAGNOSIS — R7881 Bacteremia: Secondary | ICD-10-CM | POA: Diagnosis not present

## 2019-08-28 DIAGNOSIS — I10 Essential (primary) hypertension: Secondary | ICD-10-CM | POA: Diagnosis not present

## 2019-08-28 DIAGNOSIS — F329 Major depressive disorder, single episode, unspecified: Secondary | ICD-10-CM | POA: Diagnosis not present

## 2019-08-28 DIAGNOSIS — K429 Umbilical hernia without obstruction or gangrene: Secondary | ICD-10-CM | POA: Diagnosis not present

## 2019-08-28 DIAGNOSIS — K631 Perforation of intestine (nontraumatic): Secondary | ICD-10-CM | POA: Diagnosis not present

## 2019-08-28 DIAGNOSIS — Z9049 Acquired absence of other specified parts of digestive tract: Secondary | ICD-10-CM | POA: Diagnosis not present

## 2019-08-29 DIAGNOSIS — R000 Tachycardia, unspecified: Secondary | ICD-10-CM | POA: Diagnosis not present

## 2019-08-29 DIAGNOSIS — J189 Pneumonia, unspecified organism: Secondary | ICD-10-CM | POA: Diagnosis not present

## 2019-08-29 DIAGNOSIS — R1310 Dysphagia, unspecified: Secondary | ICD-10-CM | POA: Diagnosis not present

## 2019-08-29 DIAGNOSIS — M7110 Other infective bursitis, unspecified site: Secondary | ICD-10-CM | POA: Diagnosis not present

## 2019-08-29 DIAGNOSIS — E876 Hypokalemia: Secondary | ICD-10-CM | POA: Diagnosis not present

## 2019-08-29 DIAGNOSIS — S32010A Wedge compression fracture of first lumbar vertebra, initial encounter for closed fracture: Secondary | ICD-10-CM | POA: Diagnosis not present

## 2019-08-29 DIAGNOSIS — A419 Sepsis, unspecified organism: Secondary | ICD-10-CM | POA: Diagnosis not present

## 2019-08-29 DIAGNOSIS — I10 Essential (primary) hypertension: Secondary | ICD-10-CM | POA: Diagnosis not present

## 2019-08-29 DIAGNOSIS — N3001 Acute cystitis with hematuria: Secondary | ICD-10-CM | POA: Diagnosis not present

## 2019-08-29 DIAGNOSIS — J449 Chronic obstructive pulmonary disease, unspecified: Secondary | ICD-10-CM | POA: Diagnosis not present

## 2019-08-29 DIAGNOSIS — I4891 Unspecified atrial fibrillation: Secondary | ICD-10-CM | POA: Diagnosis not present

## 2019-08-29 DIAGNOSIS — I5032 Chronic diastolic (congestive) heart failure: Secondary | ICD-10-CM | POA: Diagnosis not present

## 2019-08-30 DIAGNOSIS — Z20828 Contact with and (suspected) exposure to other viral communicable diseases: Secondary | ICD-10-CM | POA: Diagnosis not present

## 2019-08-30 DIAGNOSIS — E876 Hypokalemia: Secondary | ICD-10-CM | POA: Diagnosis not present

## 2019-08-30 DIAGNOSIS — I4891 Unspecified atrial fibrillation: Secondary | ICD-10-CM | POA: Diagnosis not present

## 2019-08-30 DIAGNOSIS — M7110 Other infective bursitis, unspecified site: Secondary | ICD-10-CM | POA: Diagnosis not present

## 2019-08-30 DIAGNOSIS — I10 Essential (primary) hypertension: Secondary | ICD-10-CM | POA: Diagnosis not present

## 2019-08-30 DIAGNOSIS — J189 Pneumonia, unspecified organism: Secondary | ICD-10-CM | POA: Diagnosis not present

## 2019-08-30 DIAGNOSIS — S32010A Wedge compression fracture of first lumbar vertebra, initial encounter for closed fracture: Secondary | ICD-10-CM | POA: Diagnosis not present

## 2019-08-30 DIAGNOSIS — S82191S Other fracture of upper end of right tibia, sequela: Secondary | ICD-10-CM | POA: Diagnosis not present

## 2019-08-30 DIAGNOSIS — I5032 Chronic diastolic (congestive) heart failure: Secondary | ICD-10-CM | POA: Diagnosis not present

## 2019-08-30 DIAGNOSIS — J449 Chronic obstructive pulmonary disease, unspecified: Secondary | ICD-10-CM | POA: Diagnosis not present

## 2019-08-30 DIAGNOSIS — Z4789 Encounter for other orthopedic aftercare: Secondary | ICD-10-CM | POA: Diagnosis not present

## 2019-08-30 DIAGNOSIS — R1310 Dysphagia, unspecified: Secondary | ICD-10-CM | POA: Diagnosis not present

## 2019-08-30 DIAGNOSIS — M6281 Muscle weakness (generalized): Secondary | ICD-10-CM | POA: Diagnosis not present

## 2019-08-30 DIAGNOSIS — A419 Sepsis, unspecified organism: Secondary | ICD-10-CM | POA: Diagnosis not present

## 2019-08-30 DIAGNOSIS — S82491S Other fracture of shaft of right fibula, sequela: Secondary | ICD-10-CM | POA: Diagnosis not present

## 2019-08-31 DIAGNOSIS — Z79899 Other long term (current) drug therapy: Secondary | ICD-10-CM | POA: Diagnosis not present

## 2019-08-31 DIAGNOSIS — Z6822 Body mass index (BMI) 22.0-22.9, adult: Secondary | ICD-10-CM | POA: Diagnosis not present

## 2019-08-31 DIAGNOSIS — I482 Chronic atrial fibrillation, unspecified: Secondary | ICD-10-CM | POA: Diagnosis not present

## 2019-08-31 DIAGNOSIS — S32010A Wedge compression fracture of first lumbar vertebra, initial encounter for closed fracture: Secondary | ICD-10-CM | POA: Diagnosis not present

## 2019-08-31 DIAGNOSIS — Z1331 Encounter for screening for depression: Secondary | ICD-10-CM | POA: Diagnosis not present

## 2019-08-31 DIAGNOSIS — B965 Pseudomonas (aeruginosa) (mallei) (pseudomallei) as the cause of diseases classified elsewhere: Secondary | ICD-10-CM | POA: Diagnosis not present

## 2019-08-31 DIAGNOSIS — I129 Hypertensive chronic kidney disease with stage 1 through stage 4 chronic kidney disease, or unspecified chronic kidney disease: Secondary | ICD-10-CM | POA: Diagnosis not present

## 2019-08-31 DIAGNOSIS — F322 Major depressive disorder, single episode, severe without psychotic features: Secondary | ICD-10-CM | POA: Diagnosis not present

## 2019-08-31 DIAGNOSIS — J9601 Acute respiratory failure with hypoxia: Secondary | ICD-10-CM | POA: Diagnosis not present

## 2019-08-31 DIAGNOSIS — I493 Ventricular premature depolarization: Secondary | ICD-10-CM | POA: Diagnosis not present

## 2019-08-31 DIAGNOSIS — E876 Hypokalemia: Secondary | ICD-10-CM | POA: Diagnosis not present

## 2019-08-31 DIAGNOSIS — N401 Enlarged prostate with lower urinary tract symptoms: Secondary | ICD-10-CM | POA: Diagnosis not present

## 2019-08-31 DIAGNOSIS — Z1339 Encounter for screening examination for other mental health and behavioral disorders: Secondary | ICD-10-CM | POA: Diagnosis not present

## 2019-08-31 DIAGNOSIS — N200 Calculus of kidney: Secondary | ICD-10-CM | POA: Diagnosis not present

## 2019-08-31 DIAGNOSIS — R338 Other retention of urine: Secondary | ICD-10-CM | POA: Diagnosis not present

## 2019-08-31 DIAGNOSIS — R310 Gross hematuria: Secondary | ICD-10-CM | POA: Diagnosis not present

## 2019-08-31 DIAGNOSIS — F0280 Dementia in other diseases classified elsewhere without behavioral disturbance: Secondary | ICD-10-CM | POA: Diagnosis not present

## 2019-08-31 DIAGNOSIS — R5383 Other fatigue: Secondary | ICD-10-CM | POA: Diagnosis not present

## 2019-08-31 DIAGNOSIS — I4891 Unspecified atrial fibrillation: Secondary | ICD-10-CM | POA: Diagnosis not present

## 2019-08-31 DIAGNOSIS — A419 Sepsis, unspecified organism: Secondary | ICD-10-CM | POA: Diagnosis not present

## 2019-08-31 DIAGNOSIS — R002 Palpitations: Secondary | ICD-10-CM | POA: Diagnosis not present

## 2019-08-31 DIAGNOSIS — U071 COVID-19: Secondary | ICD-10-CM | POA: Diagnosis not present

## 2019-08-31 DIAGNOSIS — Z0000 Encounter for general adult medical examination without abnormal findings: Secondary | ICD-10-CM | POA: Diagnosis not present

## 2019-08-31 DIAGNOSIS — N189 Chronic kidney disease, unspecified: Secondary | ICD-10-CM | POA: Diagnosis not present

## 2019-08-31 DIAGNOSIS — J449 Chronic obstructive pulmonary disease, unspecified: Secondary | ICD-10-CM | POA: Diagnosis not present

## 2019-08-31 DIAGNOSIS — I10 Essential (primary) hypertension: Secondary | ICD-10-CM | POA: Diagnosis not present

## 2019-08-31 DIAGNOSIS — N390 Urinary tract infection, site not specified: Secondary | ICD-10-CM | POA: Diagnosis not present

## 2019-08-31 DIAGNOSIS — Z23 Encounter for immunization: Secondary | ICD-10-CM | POA: Diagnosis not present

## 2019-08-31 DIAGNOSIS — Z299 Encounter for prophylactic measures, unspecified: Secondary | ICD-10-CM | POA: Diagnosis not present

## 2019-08-31 DIAGNOSIS — Z7189 Other specified counseling: Secondary | ICD-10-CM | POA: Diagnosis not present

## 2019-08-31 DIAGNOSIS — E782 Mixed hyperlipidemia: Secondary | ICD-10-CM | POA: Diagnosis not present

## 2019-08-31 DIAGNOSIS — E785 Hyperlipidemia, unspecified: Secondary | ICD-10-CM | POA: Diagnosis not present

## 2019-08-31 DIAGNOSIS — R1310 Dysphagia, unspecified: Secondary | ICD-10-CM | POA: Diagnosis not present

## 2019-08-31 DIAGNOSIS — G20 Parkinson's disease: Secondary | ICD-10-CM | POA: Diagnosis not present

## 2019-08-31 DIAGNOSIS — I5032 Chronic diastolic (congestive) heart failure: Secondary | ICD-10-CM | POA: Diagnosis not present

## 2019-08-31 DIAGNOSIS — E871 Hypo-osmolality and hyponatremia: Secondary | ICD-10-CM | POA: Diagnosis not present

## 2019-08-31 DIAGNOSIS — J189 Pneumonia, unspecified organism: Secondary | ICD-10-CM | POA: Diagnosis not present

## 2019-08-31 DIAGNOSIS — E119 Type 2 diabetes mellitus without complications: Secondary | ICD-10-CM | POA: Diagnosis not present

## 2019-08-31 DIAGNOSIS — N179 Acute kidney failure, unspecified: Secondary | ICD-10-CM | POA: Diagnosis not present

## 2019-08-31 DIAGNOSIS — Z20828 Contact with and (suspected) exposure to other viral communicable diseases: Secondary | ICD-10-CM | POA: Diagnosis not present

## 2019-08-31 DIAGNOSIS — K5090 Crohn's disease, unspecified, without complications: Secondary | ICD-10-CM | POA: Diagnosis not present

## 2019-08-31 DIAGNOSIS — M7110 Other infective bursitis, unspecified site: Secondary | ICD-10-CM | POA: Diagnosis not present

## 2019-08-31 DIAGNOSIS — Z1211 Encounter for screening for malignant neoplasm of colon: Secondary | ICD-10-CM | POA: Diagnosis not present

## 2019-09-01 DIAGNOSIS — E559 Vitamin D deficiency, unspecified: Secondary | ICD-10-CM | POA: Diagnosis not present

## 2019-09-01 DIAGNOSIS — J9610 Chronic respiratory failure, unspecified whether with hypoxia or hypercapnia: Secondary | ICD-10-CM | POA: Diagnosis not present

## 2019-09-01 DIAGNOSIS — M80031D Age-related osteoporosis with current pathological fracture, right forearm, subsequent encounter for fracture with routine healing: Secondary | ICD-10-CM | POA: Diagnosis not present

## 2019-09-01 DIAGNOSIS — I509 Heart failure, unspecified: Secondary | ICD-10-CM | POA: Diagnosis not present

## 2019-09-01 DIAGNOSIS — E119 Type 2 diabetes mellitus without complications: Secondary | ICD-10-CM | POA: Diagnosis not present

## 2019-09-01 DIAGNOSIS — I4891 Unspecified atrial fibrillation: Secondary | ICD-10-CM | POA: Diagnosis not present

## 2019-09-01 DIAGNOSIS — G4733 Obstructive sleep apnea (adult) (pediatric): Secondary | ICD-10-CM | POA: Diagnosis not present

## 2019-09-01 DIAGNOSIS — M1990 Unspecified osteoarthritis, unspecified site: Secondary | ICD-10-CM | POA: Diagnosis not present

## 2019-09-01 DIAGNOSIS — R809 Proteinuria, unspecified: Secondary | ICD-10-CM | POA: Diagnosis not present

## 2019-09-01 DIAGNOSIS — N200 Calculus of kidney: Secondary | ICD-10-CM | POA: Diagnosis not present

## 2019-09-01 DIAGNOSIS — N390 Urinary tract infection, site not specified: Secondary | ICD-10-CM | POA: Diagnosis not present

## 2019-09-01 DIAGNOSIS — M170 Bilateral primary osteoarthritis of knee: Secondary | ICD-10-CM | POA: Diagnosis not present

## 2019-09-01 DIAGNOSIS — K625 Hemorrhage of anus and rectum: Secondary | ICD-10-CM | POA: Diagnosis not present

## 2019-09-01 DIAGNOSIS — J432 Centrilobular emphysema: Secondary | ICD-10-CM | POA: Diagnosis not present

## 2019-09-01 DIAGNOSIS — J189 Pneumonia, unspecified organism: Secondary | ICD-10-CM | POA: Diagnosis not present

## 2019-09-01 DIAGNOSIS — M7110 Other infective bursitis, unspecified site: Secondary | ICD-10-CM | POA: Diagnosis not present

## 2019-09-01 DIAGNOSIS — G4700 Insomnia, unspecified: Secondary | ICD-10-CM | POA: Diagnosis not present

## 2019-09-01 DIAGNOSIS — Z87891 Personal history of nicotine dependence: Secondary | ICD-10-CM | POA: Diagnosis not present

## 2019-09-01 DIAGNOSIS — G250 Essential tremor: Secondary | ICD-10-CM | POA: Diagnosis not present

## 2019-09-01 DIAGNOSIS — K219 Gastro-esophageal reflux disease without esophagitis: Secondary | ICD-10-CM | POA: Diagnosis not present

## 2019-09-01 DIAGNOSIS — R1310 Dysphagia, unspecified: Secondary | ICD-10-CM | POA: Diagnosis not present

## 2019-09-01 DIAGNOSIS — K5730 Diverticulosis of large intestine without perforation or abscess without bleeding: Secondary | ICD-10-CM | POA: Diagnosis not present

## 2019-09-01 DIAGNOSIS — S32010A Wedge compression fracture of first lumbar vertebra, initial encounter for closed fracture: Secondary | ICD-10-CM | POA: Diagnosis not present

## 2019-09-01 DIAGNOSIS — I10 Essential (primary) hypertension: Secondary | ICD-10-CM | POA: Diagnosis not present

## 2019-09-01 DIAGNOSIS — Z20828 Contact with and (suspected) exposure to other viral communicable diseases: Secondary | ICD-10-CM | POA: Diagnosis not present

## 2019-09-01 DIAGNOSIS — N182 Chronic kidney disease, stage 2 (mild): Secondary | ICD-10-CM | POA: Diagnosis not present

## 2019-09-01 DIAGNOSIS — Z9181 History of falling: Secondary | ICD-10-CM | POA: Diagnosis not present

## 2019-09-01 DIAGNOSIS — Z79899 Other long term (current) drug therapy: Secondary | ICD-10-CM | POA: Diagnosis not present

## 2019-09-02 DIAGNOSIS — I10 Essential (primary) hypertension: Secondary | ICD-10-CM | POA: Diagnosis not present

## 2019-09-02 DIAGNOSIS — J449 Chronic obstructive pulmonary disease, unspecified: Secondary | ICD-10-CM | POA: Diagnosis not present

## 2019-09-02 DIAGNOSIS — Z0000 Encounter for general adult medical examination without abnormal findings: Secondary | ICD-10-CM | POA: Diagnosis not present

## 2019-09-02 DIAGNOSIS — F329 Major depressive disorder, single episode, unspecified: Secondary | ICD-10-CM | POA: Diagnosis not present

## 2019-09-02 DIAGNOSIS — E039 Hypothyroidism, unspecified: Secondary | ICD-10-CM | POA: Diagnosis not present

## 2019-09-02 DIAGNOSIS — G8929 Other chronic pain: Secondary | ICD-10-CM | POA: Diagnosis not present

## 2019-09-02 DIAGNOSIS — G894 Chronic pain syndrome: Secondary | ICD-10-CM | POA: Diagnosis not present

## 2019-09-02 DIAGNOSIS — Z681 Body mass index (BMI) 19 or less, adult: Secondary | ICD-10-CM | POA: Diagnosis not present

## 2019-09-02 DIAGNOSIS — M79671 Pain in right foot: Secondary | ICD-10-CM | POA: Diagnosis not present

## 2019-09-02 DIAGNOSIS — M542 Cervicalgia: Secondary | ICD-10-CM | POA: Diagnosis not present

## 2019-09-02 DIAGNOSIS — Z299 Encounter for prophylactic measures, unspecified: Secondary | ICD-10-CM | POA: Diagnosis not present

## 2019-09-02 DIAGNOSIS — F419 Anxiety disorder, unspecified: Secondary | ICD-10-CM | POA: Diagnosis not present

## 2019-09-02 DIAGNOSIS — C50911 Malignant neoplasm of unspecified site of right female breast: Secondary | ICD-10-CM | POA: Diagnosis not present

## 2019-09-02 DIAGNOSIS — J9601 Acute respiratory failure with hypoxia: Secondary | ICD-10-CM | POA: Diagnosis not present

## 2019-09-02 DIAGNOSIS — Z1211 Encounter for screening for malignant neoplasm of colon: Secondary | ICD-10-CM | POA: Diagnosis not present

## 2019-09-02 DIAGNOSIS — D649 Anemia, unspecified: Secondary | ICD-10-CM | POA: Diagnosis not present

## 2019-09-02 DIAGNOSIS — E785 Hyperlipidemia, unspecified: Secondary | ICD-10-CM | POA: Diagnosis not present

## 2019-09-02 DIAGNOSIS — E7800 Pure hypercholesterolemia, unspecified: Secondary | ICD-10-CM | POA: Diagnosis not present

## 2019-09-02 DIAGNOSIS — Z7189 Other specified counseling: Secondary | ICD-10-CM | POA: Diagnosis not present

## 2019-09-02 DIAGNOSIS — M67471 Ganglion, right ankle and foot: Secondary | ICD-10-CM | POA: Diagnosis not present

## 2019-09-02 DIAGNOSIS — M5136 Other intervertebral disc degeneration, lumbar region: Secondary | ICD-10-CM | POA: Diagnosis not present

## 2019-09-02 DIAGNOSIS — M5137 Other intervertebral disc degeneration, lumbosacral region: Secondary | ICD-10-CM | POA: Diagnosis not present

## 2019-09-02 DIAGNOSIS — Z1339 Encounter for screening examination for other mental health and behavioral disorders: Secondary | ICD-10-CM | POA: Diagnosis not present

## 2019-09-02 DIAGNOSIS — I509 Heart failure, unspecified: Secondary | ICD-10-CM | POA: Diagnosis not present

## 2019-09-02 DIAGNOSIS — Z7982 Long term (current) use of aspirin: Secondary | ICD-10-CM | POA: Diagnosis not present

## 2019-09-02 DIAGNOSIS — Z1331 Encounter for screening for depression: Secondary | ICD-10-CM | POA: Diagnosis not present

## 2019-09-02 DIAGNOSIS — I5032 Chronic diastolic (congestive) heart failure: Secondary | ICD-10-CM | POA: Diagnosis not present

## 2019-09-02 DIAGNOSIS — U071 COVID-19: Secondary | ICD-10-CM | POA: Diagnosis not present

## 2019-09-02 DIAGNOSIS — Z87891 Personal history of nicotine dependence: Secondary | ICD-10-CM | POA: Diagnosis not present

## 2019-09-02 DIAGNOSIS — I63411 Cerebral infarction due to embolism of right middle cerebral artery: Secondary | ICD-10-CM | POA: Diagnosis not present

## 2019-09-02 DIAGNOSIS — I110 Hypertensive heart disease with heart failure: Secondary | ICD-10-CM | POA: Diagnosis not present

## 2019-09-02 DIAGNOSIS — G629 Polyneuropathy, unspecified: Secondary | ICD-10-CM | POA: Diagnosis not present

## 2019-09-02 DIAGNOSIS — R778 Other specified abnormalities of plasma proteins: Secondary | ICD-10-CM | POA: Diagnosis not present

## 2019-09-02 DIAGNOSIS — M71112 Other infective bursitis, left shoulder: Secondary | ICD-10-CM | POA: Diagnosis not present

## 2019-09-02 DIAGNOSIS — H8111 Benign paroxysmal vertigo, right ear: Secondary | ICD-10-CM | POA: Diagnosis not present

## 2019-09-02 DIAGNOSIS — E876 Hypokalemia: Secondary | ICD-10-CM | POA: Diagnosis not present

## 2019-09-02 DIAGNOSIS — K625 Hemorrhage of anus and rectum: Secondary | ICD-10-CM | POA: Diagnosis not present

## 2019-09-02 DIAGNOSIS — E119 Type 2 diabetes mellitus without complications: Secondary | ICD-10-CM | POA: Diagnosis not present

## 2019-09-02 DIAGNOSIS — Z20828 Contact with and (suspected) exposure to other viral communicable diseases: Secondary | ICD-10-CM | POA: Diagnosis not present

## 2019-09-02 DIAGNOSIS — I639 Cerebral infarction, unspecified: Secondary | ICD-10-CM | POA: Diagnosis not present

## 2019-09-02 DIAGNOSIS — G4733 Obstructive sleep apnea (adult) (pediatric): Secondary | ICD-10-CM | POA: Diagnosis not present

## 2019-09-02 DIAGNOSIS — Z6826 Body mass index (BMI) 26.0-26.9, adult: Secondary | ICD-10-CM | POA: Diagnosis not present

## 2019-09-02 DIAGNOSIS — Z7952 Long term (current) use of systemic steroids: Secondary | ICD-10-CM | POA: Diagnosis not present

## 2019-09-02 DIAGNOSIS — Z79891 Long term (current) use of opiate analgesic: Secondary | ICD-10-CM | POA: Diagnosis not present

## 2019-09-03 DIAGNOSIS — Z515 Encounter for palliative care: Secondary | ICD-10-CM | POA: Diagnosis not present

## 2019-09-03 DIAGNOSIS — Z9012 Acquired absence of left breast and nipple: Secondary | ICD-10-CM | POA: Diagnosis not present

## 2019-09-03 DIAGNOSIS — I69351 Hemiplegia and hemiparesis following cerebral infarction affecting right dominant side: Secondary | ICD-10-CM | POA: Diagnosis not present

## 2019-09-03 DIAGNOSIS — Z87891 Personal history of nicotine dependence: Secondary | ICD-10-CM | POA: Diagnosis not present

## 2019-09-03 DIAGNOSIS — J3089 Other allergic rhinitis: Secondary | ICD-10-CM | POA: Diagnosis not present

## 2019-09-03 DIAGNOSIS — D1803 Hemangioma of intra-abdominal structures: Secondary | ICD-10-CM | POA: Diagnosis not present

## 2019-09-03 DIAGNOSIS — J3081 Allergic rhinitis due to animal (cat) (dog) hair and dander: Secondary | ICD-10-CM | POA: Diagnosis not present

## 2019-09-03 DIAGNOSIS — U071 COVID-19: Secondary | ICD-10-CM | POA: Diagnosis not present

## 2019-09-03 DIAGNOSIS — M1711 Unilateral primary osteoarthritis, right knee: Secondary | ICD-10-CM | POA: Diagnosis not present

## 2019-09-03 DIAGNOSIS — K279 Peptic ulcer, site unspecified, unspecified as acute or chronic, without hemorrhage or perforation: Secondary | ICD-10-CM | POA: Diagnosis not present

## 2019-09-03 DIAGNOSIS — E782 Mixed hyperlipidemia: Secondary | ICD-10-CM | POA: Diagnosis not present

## 2019-09-03 DIAGNOSIS — M5030 Other cervical disc degeneration, unspecified cervical region: Secondary | ICD-10-CM | POA: Diagnosis not present

## 2019-09-03 DIAGNOSIS — J301 Allergic rhinitis due to pollen: Secondary | ICD-10-CM | POA: Diagnosis not present

## 2019-09-03 DIAGNOSIS — Z9181 History of falling: Secondary | ICD-10-CM | POA: Diagnosis not present

## 2019-09-03 DIAGNOSIS — J9601 Acute respiratory failure with hypoxia: Secondary | ICD-10-CM | POA: Diagnosis not present

## 2019-09-03 DIAGNOSIS — F329 Major depressive disorder, single episode, unspecified: Secondary | ICD-10-CM | POA: Diagnosis not present

## 2019-09-03 DIAGNOSIS — G4733 Obstructive sleep apnea (adult) (pediatric): Secondary | ICD-10-CM | POA: Diagnosis not present

## 2019-09-03 DIAGNOSIS — S8001XD Contusion of right knee, subsequent encounter: Secondary | ICD-10-CM | POA: Diagnosis not present

## 2019-09-03 DIAGNOSIS — I10 Essential (primary) hypertension: Secondary | ICD-10-CM | POA: Diagnosis not present

## 2019-09-03 DIAGNOSIS — Z20828 Contact with and (suspected) exposure to other viral communicable diseases: Secondary | ICD-10-CM | POA: Diagnosis not present

## 2019-09-03 DIAGNOSIS — E785 Hyperlipidemia, unspecified: Secondary | ICD-10-CM | POA: Diagnosis not present

## 2019-09-03 DIAGNOSIS — M4802 Spinal stenosis, cervical region: Secondary | ICD-10-CM | POA: Diagnosis not present

## 2019-09-03 DIAGNOSIS — S83241A Other tear of medial meniscus, current injury, right knee, initial encounter: Secondary | ICD-10-CM | POA: Diagnosis not present

## 2019-09-03 DIAGNOSIS — R778 Other specified abnormalities of plasma proteins: Secondary | ICD-10-CM | POA: Diagnosis not present

## 2019-09-03 DIAGNOSIS — Z853 Personal history of malignant neoplasm of breast: Secondary | ICD-10-CM | POA: Diagnosis not present

## 2019-09-04 DIAGNOSIS — M869 Osteomyelitis, unspecified: Secondary | ICD-10-CM | POA: Diagnosis not present

## 2019-09-04 DIAGNOSIS — I6523 Occlusion and stenosis of bilateral carotid arteries: Secondary | ICD-10-CM | POA: Diagnosis not present

## 2019-09-04 DIAGNOSIS — I10 Essential (primary) hypertension: Secondary | ICD-10-CM | POA: Diagnosis not present

## 2019-09-04 DIAGNOSIS — I2510 Atherosclerotic heart disease of native coronary artery without angina pectoris: Secondary | ICD-10-CM | POA: Diagnosis not present

## 2019-09-04 DIAGNOSIS — R030 Elevated blood-pressure reading, without diagnosis of hypertension: Secondary | ICD-10-CM | POA: Diagnosis not present

## 2019-09-04 DIAGNOSIS — R778 Other specified abnormalities of plasma proteins: Secondary | ICD-10-CM | POA: Diagnosis not present

## 2019-09-04 DIAGNOSIS — L89159 Pressure ulcer of sacral region, unspecified stage: Secondary | ICD-10-CM | POA: Diagnosis not present

## 2019-09-04 DIAGNOSIS — E0590 Thyrotoxicosis, unspecified without thyrotoxic crisis or storm: Secondary | ICD-10-CM | POA: Diagnosis not present

## 2019-09-04 DIAGNOSIS — N179 Acute kidney failure, unspecified: Secondary | ICD-10-CM | POA: Diagnosis not present

## 2019-09-04 DIAGNOSIS — C4922 Malignant neoplasm of connective and soft tissue of left lower limb, including hip: Secondary | ICD-10-CM | POA: Diagnosis not present

## 2019-09-04 DIAGNOSIS — E785 Hyperlipidemia, unspecified: Secondary | ICD-10-CM | POA: Diagnosis not present

## 2019-09-04 DIAGNOSIS — I252 Old myocardial infarction: Secondary | ICD-10-CM | POA: Diagnosis not present

## 2019-09-04 DIAGNOSIS — Z452 Encounter for adjustment and management of vascular access device: Secondary | ICD-10-CM | POA: Diagnosis not present

## 2019-09-04 DIAGNOSIS — Z792 Long term (current) use of antibiotics: Secondary | ICD-10-CM | POA: Diagnosis not present

## 2019-09-04 DIAGNOSIS — J449 Chronic obstructive pulmonary disease, unspecified: Secondary | ICD-10-CM | POA: Diagnosis not present

## 2019-09-04 DIAGNOSIS — R9431 Abnormal electrocardiogram [ECG] [EKG]: Secondary | ICD-10-CM | POA: Diagnosis not present

## 2019-09-04 DIAGNOSIS — K5190 Ulcerative colitis, unspecified, without complications: Secondary | ICD-10-CM | POA: Diagnosis not present

## 2019-09-04 DIAGNOSIS — R531 Weakness: Secondary | ICD-10-CM | POA: Diagnosis not present

## 2019-09-04 DIAGNOSIS — U071 COVID-19: Secondary | ICD-10-CM | POA: Diagnosis not present

## 2019-09-04 DIAGNOSIS — T8452XA Infection and inflammatory reaction due to internal left hip prosthesis, initial encounter: Secondary | ICD-10-CM | POA: Diagnosis not present

## 2019-09-04 DIAGNOSIS — J9601 Acute respiratory failure with hypoxia: Secondary | ICD-10-CM | POA: Diagnosis not present

## 2019-09-04 DIAGNOSIS — D500 Iron deficiency anemia secondary to blood loss (chronic): Secondary | ICD-10-CM | POA: Diagnosis not present

## 2019-09-04 DIAGNOSIS — Z5181 Encounter for therapeutic drug level monitoring: Secondary | ICD-10-CM | POA: Diagnosis not present

## 2019-09-05 DIAGNOSIS — L89159 Pressure ulcer of sacral region, unspecified stage: Secondary | ICD-10-CM | POA: Diagnosis not present

## 2019-09-05 DIAGNOSIS — N179 Acute kidney failure, unspecified: Secondary | ICD-10-CM | POA: Diagnosis not present

## 2019-09-05 DIAGNOSIS — I10 Essential (primary) hypertension: Secondary | ICD-10-CM | POA: Diagnosis not present

## 2019-09-05 DIAGNOSIS — R531 Weakness: Secondary | ICD-10-CM | POA: Diagnosis not present

## 2019-09-06 DIAGNOSIS — M25562 Pain in left knee: Secondary | ICD-10-CM | POA: Diagnosis not present

## 2019-09-06 DIAGNOSIS — I6523 Occlusion and stenosis of bilateral carotid arteries: Secondary | ICD-10-CM | POA: Diagnosis not present

## 2019-09-06 DIAGNOSIS — A409 Streptococcal sepsis, unspecified: Secondary | ICD-10-CM | POA: Diagnosis not present

## 2019-09-06 DIAGNOSIS — Z96653 Presence of artificial knee joint, bilateral: Secondary | ICD-10-CM | POA: Diagnosis not present

## 2019-09-06 DIAGNOSIS — M25561 Pain in right knee: Secondary | ICD-10-CM | POA: Diagnosis not present

## 2019-09-06 DIAGNOSIS — Z7901 Long term (current) use of anticoagulants: Secondary | ICD-10-CM | POA: Diagnosis not present

## 2019-09-06 DIAGNOSIS — Z789 Other specified health status: Secondary | ICD-10-CM | POA: Diagnosis not present

## 2019-09-06 DIAGNOSIS — Z803 Family history of malignant neoplasm of breast: Secondary | ICD-10-CM | POA: Diagnosis not present

## 2019-09-06 DIAGNOSIS — K219 Gastro-esophageal reflux disease without esophagitis: Secondary | ICD-10-CM | POA: Diagnosis not present

## 2019-09-06 DIAGNOSIS — G9341 Metabolic encephalopathy: Secondary | ICD-10-CM | POA: Diagnosis not present

## 2019-09-06 DIAGNOSIS — Z96611 Presence of right artificial shoulder joint: Secondary | ICD-10-CM | POA: Diagnosis not present

## 2019-09-06 DIAGNOSIS — C772 Secondary and unspecified malignant neoplasm of intra-abdominal lymph nodes: Secondary | ICD-10-CM | POA: Diagnosis not present

## 2019-09-06 DIAGNOSIS — R531 Weakness: Secondary | ICD-10-CM | POA: Diagnosis not present

## 2019-09-06 DIAGNOSIS — Z8546 Personal history of malignant neoplasm of prostate: Secondary | ICD-10-CM | POA: Diagnosis not present

## 2019-09-06 DIAGNOSIS — Z8042 Family history of malignant neoplasm of prostate: Secondary | ICD-10-CM | POA: Diagnosis not present

## 2019-09-06 DIAGNOSIS — Z86718 Personal history of other venous thrombosis and embolism: Secondary | ICD-10-CM | POA: Diagnosis not present

## 2019-09-06 DIAGNOSIS — I492 Junctional premature depolarization: Secondary | ICD-10-CM | POA: Diagnosis not present

## 2019-09-06 DIAGNOSIS — K2100 Gastro-esophageal reflux disease with esophagitis, without bleeding: Secondary | ICD-10-CM | POA: Diagnosis not present

## 2019-09-06 DIAGNOSIS — Z95828 Presence of other vascular implants and grafts: Secondary | ICD-10-CM | POA: Diagnosis not present

## 2019-09-06 DIAGNOSIS — D62 Acute posthemorrhagic anemia: Secondary | ICD-10-CM | POA: Diagnosis not present

## 2019-09-06 DIAGNOSIS — D6859 Other primary thrombophilia: Secondary | ICD-10-CM | POA: Diagnosis not present

## 2019-09-06 DIAGNOSIS — L89159 Pressure ulcer of sacral region, unspecified stage: Secondary | ICD-10-CM | POA: Diagnosis not present

## 2019-09-06 DIAGNOSIS — Z885 Allergy status to narcotic agent status: Secondary | ICD-10-CM | POA: Diagnosis not present

## 2019-09-06 DIAGNOSIS — E785 Hyperlipidemia, unspecified: Secondary | ICD-10-CM | POA: Diagnosis not present

## 2019-09-06 DIAGNOSIS — R627 Adult failure to thrive: Secondary | ICD-10-CM | POA: Diagnosis not present

## 2019-09-06 DIAGNOSIS — I10 Essential (primary) hypertension: Secondary | ICD-10-CM | POA: Diagnosis not present

## 2019-09-06 DIAGNOSIS — N183 Chronic kidney disease, stage 3 unspecified: Secondary | ICD-10-CM | POA: Diagnosis not present

## 2019-09-06 DIAGNOSIS — C187 Malignant neoplasm of sigmoid colon: Secondary | ICD-10-CM | POA: Diagnosis not present

## 2019-09-06 DIAGNOSIS — M1990 Unspecified osteoarthritis, unspecified site: Secondary | ICD-10-CM | POA: Diagnosis not present

## 2019-09-06 DIAGNOSIS — Z87891 Personal history of nicotine dependence: Secondary | ICD-10-CM | POA: Diagnosis not present

## 2019-09-06 DIAGNOSIS — I4581 Long QT syndrome: Secondary | ICD-10-CM | POA: Diagnosis not present

## 2019-09-06 DIAGNOSIS — Z881 Allergy status to other antibiotic agents status: Secondary | ICD-10-CM | POA: Diagnosis not present

## 2019-09-06 DIAGNOSIS — N179 Acute kidney failure, unspecified: Secondary | ICD-10-CM | POA: Diagnosis not present

## 2019-09-06 DIAGNOSIS — M810 Age-related osteoporosis without current pathological fracture: Secondary | ICD-10-CM | POA: Diagnosis not present

## 2019-09-06 DIAGNOSIS — Z20828 Contact with and (suspected) exposure to other viral communicable diseases: Secondary | ICD-10-CM | POA: Diagnosis not present

## 2019-09-06 DIAGNOSIS — Z483 Aftercare following surgery for neoplasm: Secondary | ICD-10-CM | POA: Diagnosis not present

## 2019-09-06 DIAGNOSIS — Z452 Encounter for adjustment and management of vascular access device: Secondary | ICD-10-CM | POA: Diagnosis not present

## 2019-09-06 DIAGNOSIS — Z923 Personal history of irradiation: Secondary | ICD-10-CM | POA: Diagnosis not present

## 2019-09-06 DIAGNOSIS — M069 Rheumatoid arthritis, unspecified: Secondary | ICD-10-CM | POA: Diagnosis not present

## 2019-09-07 DIAGNOSIS — Z90710 Acquired absence of both cervix and uterus: Secondary | ICD-10-CM | POA: Diagnosis not present

## 2019-09-07 DIAGNOSIS — M549 Dorsalgia, unspecified: Secondary | ICD-10-CM | POA: Diagnosis not present

## 2019-09-07 DIAGNOSIS — R531 Weakness: Secondary | ICD-10-CM | POA: Diagnosis not present

## 2019-09-07 DIAGNOSIS — Z780 Asymptomatic menopausal state: Secondary | ICD-10-CM | POA: Diagnosis not present

## 2019-09-07 DIAGNOSIS — N390 Urinary tract infection, site not specified: Secondary | ICD-10-CM | POA: Diagnosis not present

## 2019-09-07 DIAGNOSIS — I110 Hypertensive heart disease with heart failure: Secondary | ICD-10-CM | POA: Diagnosis not present

## 2019-09-07 DIAGNOSIS — Z9189 Other specified personal risk factors, not elsewhere classified: Secondary | ICD-10-CM | POA: Diagnosis not present

## 2019-09-07 DIAGNOSIS — R627 Adult failure to thrive: Secondary | ICD-10-CM | POA: Diagnosis not present

## 2019-09-07 DIAGNOSIS — J9610 Chronic respiratory failure, unspecified whether with hypoxia or hypercapnia: Secondary | ICD-10-CM | POA: Diagnosis not present

## 2019-09-07 DIAGNOSIS — Z01419 Encounter for gynecological examination (general) (routine) without abnormal findings: Secondary | ICD-10-CM | POA: Diagnosis not present

## 2019-09-07 DIAGNOSIS — G4733 Obstructive sleep apnea (adult) (pediatric): Secondary | ICD-10-CM | POA: Diagnosis not present

## 2019-09-07 DIAGNOSIS — Z9981 Dependence on supplemental oxygen: Secondary | ICD-10-CM | POA: Diagnosis not present

## 2019-09-07 DIAGNOSIS — L89159 Pressure ulcer of sacral region, unspecified stage: Secondary | ICD-10-CM | POA: Diagnosis not present

## 2019-09-07 DIAGNOSIS — B9620 Unspecified Escherichia coli [E. coli] as the cause of diseases classified elsewhere: Secondary | ICD-10-CM | POA: Diagnosis not present

## 2019-09-07 DIAGNOSIS — M1990 Unspecified osteoarthritis, unspecified site: Secondary | ICD-10-CM | POA: Diagnosis not present

## 2019-09-07 DIAGNOSIS — M170 Bilateral primary osteoarthritis of knee: Secondary | ICD-10-CM | POA: Diagnosis not present

## 2019-09-07 DIAGNOSIS — L89152 Pressure ulcer of sacral region, stage 2: Secondary | ICD-10-CM | POA: Diagnosis not present

## 2019-09-07 DIAGNOSIS — M25551 Pain in right hip: Secondary | ICD-10-CM | POA: Diagnosis not present

## 2019-09-07 DIAGNOSIS — J449 Chronic obstructive pulmonary disease, unspecified: Secondary | ICD-10-CM | POA: Diagnosis not present

## 2019-09-07 DIAGNOSIS — N179 Acute kidney failure, unspecified: Secondary | ICD-10-CM | POA: Diagnosis not present

## 2019-09-07 DIAGNOSIS — C787 Secondary malignant neoplasm of liver and intrahepatic bile duct: Secondary | ICD-10-CM | POA: Diagnosis not present

## 2019-09-07 DIAGNOSIS — I5040 Unspecified combined systolic (congestive) and diastolic (congestive) heart failure: Secondary | ICD-10-CM | POA: Diagnosis not present

## 2019-09-07 DIAGNOSIS — I10 Essential (primary) hypertension: Secondary | ICD-10-CM | POA: Diagnosis not present

## 2019-09-07 DIAGNOSIS — R338 Other retention of urine: Secondary | ICD-10-CM | POA: Diagnosis not present

## 2019-09-07 DIAGNOSIS — Z803 Family history of malignant neoplasm of breast: Secondary | ICD-10-CM | POA: Diagnosis not present

## 2019-09-07 DIAGNOSIS — J441 Chronic obstructive pulmonary disease with (acute) exacerbation: Secondary | ICD-10-CM | POA: Diagnosis not present

## 2019-09-07 DIAGNOSIS — F0390 Unspecified dementia without behavioral disturbance: Secondary | ICD-10-CM | POA: Diagnosis not present

## 2019-09-07 DIAGNOSIS — I714 Abdominal aortic aneurysm, without rupture: Secondary | ICD-10-CM | POA: Diagnosis not present

## 2019-09-07 DIAGNOSIS — Z8701 Personal history of pneumonia (recurrent): Secondary | ICD-10-CM | POA: Diagnosis not present

## 2019-09-07 DIAGNOSIS — N401 Enlarged prostate with lower urinary tract symptoms: Secondary | ICD-10-CM | POA: Diagnosis not present

## 2019-09-07 DIAGNOSIS — H9190 Unspecified hearing loss, unspecified ear: Secondary | ICD-10-CM | POA: Diagnosis not present

## 2019-09-07 DIAGNOSIS — D638 Anemia in other chronic diseases classified elsewhere: Secondary | ICD-10-CM | POA: Diagnosis not present

## 2019-09-07 DIAGNOSIS — N8111 Cystocele, midline: Secondary | ICD-10-CM | POA: Diagnosis not present

## 2019-09-07 DIAGNOSIS — K219 Gastro-esophageal reflux disease without esophagitis: Secondary | ICD-10-CM | POA: Diagnosis not present

## 2019-09-07 DIAGNOSIS — K5900 Constipation, unspecified: Secondary | ICD-10-CM | POA: Diagnosis not present

## 2019-09-07 DIAGNOSIS — Z23 Encounter for immunization: Secondary | ICD-10-CM | POA: Diagnosis not present

## 2019-09-07 DIAGNOSIS — R911 Solitary pulmonary nodule: Secondary | ICD-10-CM | POA: Diagnosis not present

## 2019-09-07 DIAGNOSIS — E43 Unspecified severe protein-calorie malnutrition: Secondary | ICD-10-CM | POA: Diagnosis not present

## 2019-09-07 DIAGNOSIS — F329 Major depressive disorder, single episode, unspecified: Secondary | ICD-10-CM | POA: Diagnosis not present

## 2019-09-07 DIAGNOSIS — I509 Heart failure, unspecified: Secondary | ICD-10-CM | POA: Diagnosis not present

## 2019-09-07 DIAGNOSIS — N952 Postmenopausal atrophic vaginitis: Secondary | ICD-10-CM | POA: Diagnosis not present

## 2019-09-07 DIAGNOSIS — Z03818 Encounter for observation for suspected exposure to other biological agents ruled out: Secondary | ICD-10-CM | POA: Diagnosis not present

## 2019-09-07 DIAGNOSIS — G8929 Other chronic pain: Secondary | ICD-10-CM | POA: Diagnosis not present

## 2019-09-07 DIAGNOSIS — F419 Anxiety disorder, unspecified: Secondary | ICD-10-CM | POA: Diagnosis not present

## 2019-09-07 DIAGNOSIS — M19012 Primary osteoarthritis, left shoulder: Secondary | ICD-10-CM | POA: Diagnosis not present

## 2019-09-07 DIAGNOSIS — J019 Acute sinusitis, unspecified: Secondary | ICD-10-CM | POA: Diagnosis not present

## 2019-09-07 DIAGNOSIS — E876 Hypokalemia: Secondary | ICD-10-CM | POA: Diagnosis not present

## 2019-09-07 DIAGNOSIS — E785 Hyperlipidemia, unspecified: Secondary | ICD-10-CM | POA: Diagnosis not present

## 2019-09-08 DIAGNOSIS — M1990 Unspecified osteoarthritis, unspecified site: Secondary | ICD-10-CM | POA: Diagnosis not present

## 2019-09-08 DIAGNOSIS — I10 Essential (primary) hypertension: Secondary | ICD-10-CM | POA: Diagnosis not present

## 2019-09-08 DIAGNOSIS — Z9181 History of falling: Secondary | ICD-10-CM | POA: Diagnosis not present

## 2019-09-08 DIAGNOSIS — G250 Essential tremor: Secondary | ICD-10-CM | POA: Diagnosis not present

## 2019-09-08 DIAGNOSIS — K5730 Diverticulosis of large intestine without perforation or abscess without bleeding: Secondary | ICD-10-CM | POA: Diagnosis not present

## 2019-09-08 DIAGNOSIS — E1165 Type 2 diabetes mellitus with hyperglycemia: Secondary | ICD-10-CM | POA: Diagnosis not present

## 2019-09-08 DIAGNOSIS — G4700 Insomnia, unspecified: Secondary | ICD-10-CM | POA: Diagnosis not present

## 2019-09-08 DIAGNOSIS — G4733 Obstructive sleep apnea (adult) (pediatric): Secondary | ICD-10-CM | POA: Diagnosis not present

## 2019-09-08 DIAGNOSIS — N2889 Other specified disorders of kidney and ureter: Secondary | ICD-10-CM | POA: Diagnosis not present

## 2019-09-08 DIAGNOSIS — M80031D Age-related osteoporosis with current pathological fracture, right forearm, subsequent encounter for fracture with routine healing: Secondary | ICD-10-CM | POA: Diagnosis not present

## 2019-09-08 DIAGNOSIS — C73 Malignant neoplasm of thyroid gland: Secondary | ICD-10-CM | POA: Diagnosis not present

## 2019-09-08 DIAGNOSIS — K219 Gastro-esophageal reflux disease without esophagitis: Secondary | ICD-10-CM | POA: Diagnosis not present

## 2019-09-08 DIAGNOSIS — N183 Chronic kidney disease, stage 3 unspecified: Secondary | ICD-10-CM | POA: Diagnosis not present

## 2019-09-08 DIAGNOSIS — R030 Elevated blood-pressure reading, without diagnosis of hypertension: Secondary | ICD-10-CM | POA: Diagnosis not present

## 2019-09-08 DIAGNOSIS — H5203 Hypermetropia, bilateral: Secondary | ICD-10-CM | POA: Diagnosis not present

## 2019-09-08 DIAGNOSIS — R531 Weakness: Secondary | ICD-10-CM | POA: Diagnosis not present

## 2019-09-08 DIAGNOSIS — R0683 Snoring: Secondary | ICD-10-CM | POA: Diagnosis not present

## 2019-09-08 DIAGNOSIS — N2581 Secondary hyperparathyroidism of renal origin: Secondary | ICD-10-CM | POA: Diagnosis not present

## 2019-09-08 DIAGNOSIS — Z299 Encounter for prophylactic measures, unspecified: Secondary | ICD-10-CM | POA: Diagnosis not present

## 2019-09-08 DIAGNOSIS — Z23 Encounter for immunization: Secondary | ICD-10-CM | POA: Diagnosis not present

## 2019-09-08 DIAGNOSIS — Z791 Long term (current) use of non-steroidal anti-inflammatories (NSAID): Secondary | ICD-10-CM | POA: Diagnosis not present

## 2019-09-08 DIAGNOSIS — H60549 Acute eczematoid otitis externa, unspecified ear: Secondary | ICD-10-CM | POA: Diagnosis not present

## 2019-09-08 DIAGNOSIS — J9610 Chronic respiratory failure, unspecified whether with hypoxia or hypercapnia: Secondary | ICD-10-CM | POA: Diagnosis not present

## 2019-09-08 DIAGNOSIS — I480 Paroxysmal atrial fibrillation: Secondary | ICD-10-CM | POA: Diagnosis not present

## 2019-09-08 DIAGNOSIS — E7800 Pure hypercholesterolemia, unspecified: Secondary | ICD-10-CM | POA: Diagnosis not present

## 2019-09-08 DIAGNOSIS — Z87891 Personal history of nicotine dependence: Secondary | ICD-10-CM | POA: Diagnosis not present

## 2019-09-08 DIAGNOSIS — L89159 Pressure ulcer of sacral region, unspecified stage: Secondary | ICD-10-CM | POA: Diagnosis not present

## 2019-09-08 DIAGNOSIS — Z1339 Encounter for screening examination for other mental health and behavioral disorders: Secondary | ICD-10-CM | POA: Diagnosis not present

## 2019-09-08 DIAGNOSIS — Z7189 Other specified counseling: Secondary | ICD-10-CM | POA: Diagnosis not present

## 2019-09-08 DIAGNOSIS — F411 Generalized anxiety disorder: Secondary | ICD-10-CM | POA: Diagnosis not present

## 2019-09-08 DIAGNOSIS — F17210 Nicotine dependence, cigarettes, uncomplicated: Secondary | ICD-10-CM | POA: Diagnosis not present

## 2019-09-08 DIAGNOSIS — U071 COVID-19: Secondary | ICD-10-CM | POA: Diagnosis not present

## 2019-09-08 DIAGNOSIS — Z6822 Body mass index (BMI) 22.0-22.9, adult: Secondary | ICD-10-CM | POA: Diagnosis not present

## 2019-09-08 DIAGNOSIS — J309 Allergic rhinitis, unspecified: Secondary | ICD-10-CM | POA: Diagnosis not present

## 2019-09-08 DIAGNOSIS — E890 Postprocedural hypothyroidism: Secondary | ICD-10-CM | POA: Diagnosis not present

## 2019-09-08 DIAGNOSIS — N179 Acute kidney failure, unspecified: Secondary | ICD-10-CM | POA: Diagnosis not present

## 2019-09-08 DIAGNOSIS — Z0000 Encounter for general adult medical examination without abnormal findings: Secondary | ICD-10-CM | POA: Diagnosis not present

## 2019-09-08 DIAGNOSIS — Z1331 Encounter for screening for depression: Secondary | ICD-10-CM | POA: Diagnosis not present

## 2019-09-08 DIAGNOSIS — Z79899 Other long term (current) drug therapy: Secondary | ICD-10-CM | POA: Diagnosis not present

## 2019-09-08 DIAGNOSIS — J432 Centrilobular emphysema: Secondary | ICD-10-CM | POA: Diagnosis not present

## 2019-09-08 DIAGNOSIS — R627 Adult failure to thrive: Secondary | ICD-10-CM | POA: Diagnosis not present

## 2019-09-08 DIAGNOSIS — R5383 Other fatigue: Secondary | ICD-10-CM | POA: Diagnosis not present

## 2019-09-08 DIAGNOSIS — Z6820 Body mass index (BMI) 20.0-20.9, adult: Secondary | ICD-10-CM | POA: Diagnosis not present

## 2019-09-08 DIAGNOSIS — H3561 Retinal hemorrhage, right eye: Secondary | ICD-10-CM | POA: Diagnosis not present

## 2019-09-08 DIAGNOSIS — Z6841 Body Mass Index (BMI) 40.0 and over, adult: Secondary | ICD-10-CM | POA: Diagnosis not present

## 2019-09-09 DIAGNOSIS — Z7189 Other specified counseling: Secondary | ICD-10-CM | POA: Diagnosis not present

## 2019-09-09 DIAGNOSIS — R55 Syncope and collapse: Secondary | ICD-10-CM | POA: Diagnosis not present

## 2019-09-09 DIAGNOSIS — E1122 Type 2 diabetes mellitus with diabetic chronic kidney disease: Secondary | ICD-10-CM | POA: Diagnosis not present

## 2019-09-09 DIAGNOSIS — N401 Enlarged prostate with lower urinary tract symptoms: Secondary | ICD-10-CM | POA: Diagnosis not present

## 2019-09-09 DIAGNOSIS — I129 Hypertensive chronic kidney disease with stage 1 through stage 4 chronic kidney disease, or unspecified chronic kidney disease: Secondary | ICD-10-CM | POA: Diagnosis not present

## 2019-09-09 DIAGNOSIS — E7800 Pure hypercholesterolemia, unspecified: Secondary | ICD-10-CM | POA: Diagnosis not present

## 2019-09-09 DIAGNOSIS — M349 Systemic sclerosis, unspecified: Secondary | ICD-10-CM | POA: Diagnosis not present

## 2019-09-09 DIAGNOSIS — Z299 Encounter for prophylactic measures, unspecified: Secondary | ICD-10-CM | POA: Diagnosis not present

## 2019-09-09 DIAGNOSIS — R1310 Dysphagia, unspecified: Secondary | ICD-10-CM | POA: Diagnosis not present

## 2019-09-09 DIAGNOSIS — M3481 Systemic sclerosis with lung involvement: Secondary | ICD-10-CM | POA: Diagnosis not present

## 2019-09-09 DIAGNOSIS — G4733 Obstructive sleep apnea (adult) (pediatric): Secondary | ICD-10-CM | POA: Diagnosis not present

## 2019-09-09 DIAGNOSIS — I2609 Other pulmonary embolism with acute cor pulmonale: Secondary | ICD-10-CM | POA: Diagnosis not present

## 2019-09-09 DIAGNOSIS — Z471 Aftercare following joint replacement surgery: Secondary | ICD-10-CM | POA: Diagnosis not present

## 2019-09-09 DIAGNOSIS — Z1159 Encounter for screening for other viral diseases: Secondary | ICD-10-CM | POA: Diagnosis not present

## 2019-09-09 DIAGNOSIS — R011 Cardiac murmur, unspecified: Secondary | ICD-10-CM | POA: Diagnosis not present

## 2019-09-09 DIAGNOSIS — Z9181 History of falling: Secondary | ICD-10-CM | POA: Diagnosis not present

## 2019-09-09 DIAGNOSIS — M7061 Trochanteric bursitis, right hip: Secondary | ICD-10-CM | POA: Diagnosis not present

## 2019-09-09 DIAGNOSIS — I10 Essential (primary) hypertension: Secondary | ICD-10-CM | POA: Diagnosis not present

## 2019-09-09 DIAGNOSIS — Z0000 Encounter for general adult medical examination without abnormal findings: Secondary | ICD-10-CM | POA: Diagnosis not present

## 2019-09-09 DIAGNOSIS — R0609 Other forms of dyspnea: Secondary | ICD-10-CM | POA: Diagnosis not present

## 2019-09-09 DIAGNOSIS — Z23 Encounter for immunization: Secondary | ICD-10-CM | POA: Diagnosis not present

## 2019-09-09 DIAGNOSIS — M1611 Unilateral primary osteoarthritis, right hip: Secondary | ICD-10-CM | POA: Diagnosis not present

## 2019-09-09 DIAGNOSIS — K219 Gastro-esophageal reflux disease without esophagitis: Secondary | ICD-10-CM | POA: Diagnosis not present

## 2019-09-09 DIAGNOSIS — S72455D Nondisplaced supracondylar fracture without intracondylar extension of lower end of left femur, subsequent encounter for closed fracture with routine healing: Secondary | ICD-10-CM | POA: Diagnosis not present

## 2019-09-09 DIAGNOSIS — R531 Weakness: Secondary | ICD-10-CM | POA: Diagnosis not present

## 2019-09-09 DIAGNOSIS — I2510 Atherosclerotic heart disease of native coronary artery without angina pectoris: Secondary | ICD-10-CM | POA: Diagnosis not present

## 2019-09-09 DIAGNOSIS — I7300 Raynaud's syndrome without gangrene: Secondary | ICD-10-CM | POA: Diagnosis not present

## 2019-09-09 DIAGNOSIS — R627 Adult failure to thrive: Secondary | ICD-10-CM | POA: Diagnosis not present

## 2019-09-09 DIAGNOSIS — Z7982 Long term (current) use of aspirin: Secondary | ICD-10-CM | POA: Diagnosis not present

## 2019-09-09 DIAGNOSIS — Z6835 Body mass index (BMI) 35.0-35.9, adult: Secondary | ICD-10-CM | POA: Diagnosis not present

## 2019-09-09 DIAGNOSIS — J9601 Acute respiratory failure with hypoxia: Secondary | ICD-10-CM | POA: Diagnosis not present

## 2019-09-09 DIAGNOSIS — E1159 Type 2 diabetes mellitus with other circulatory complications: Secondary | ICD-10-CM | POA: Diagnosis not present

## 2019-09-09 DIAGNOSIS — E785 Hyperlipidemia, unspecified: Secondary | ICD-10-CM | POA: Diagnosis not present

## 2019-09-09 DIAGNOSIS — E039 Hypothyroidism, unspecified: Secondary | ICD-10-CM | POA: Diagnosis not present

## 2019-09-09 DIAGNOSIS — Z1339 Encounter for screening examination for other mental health and behavioral disorders: Secondary | ICD-10-CM | POA: Diagnosis not present

## 2019-09-09 DIAGNOSIS — Z6827 Body mass index (BMI) 27.0-27.9, adult: Secondary | ICD-10-CM | POA: Diagnosis not present

## 2019-09-09 DIAGNOSIS — Z1331 Encounter for screening for depression: Secondary | ICD-10-CM | POA: Diagnosis not present

## 2019-09-09 DIAGNOSIS — M545 Low back pain: Secondary | ICD-10-CM | POA: Diagnosis not present

## 2019-09-09 DIAGNOSIS — H9190 Unspecified hearing loss, unspecified ear: Secondary | ICD-10-CM | POA: Diagnosis not present

## 2019-09-09 DIAGNOSIS — J9611 Chronic respiratory failure with hypoxia: Secondary | ICD-10-CM | POA: Diagnosis not present

## 2019-09-09 DIAGNOSIS — R5383 Other fatigue: Secondary | ICD-10-CM | POA: Diagnosis not present

## 2019-09-09 DIAGNOSIS — I5032 Chronic diastolic (congestive) heart failure: Secondary | ICD-10-CM | POA: Diagnosis not present

## 2019-09-09 DIAGNOSIS — J9610 Chronic respiratory failure, unspecified whether with hypoxia or hypercapnia: Secondary | ICD-10-CM | POA: Diagnosis not present

## 2019-09-09 DIAGNOSIS — L89159 Pressure ulcer of sacral region, unspecified stage: Secondary | ICD-10-CM | POA: Diagnosis not present

## 2019-09-09 DIAGNOSIS — Z1211 Encounter for screening for malignant neoplasm of colon: Secondary | ICD-10-CM | POA: Diagnosis not present

## 2019-09-09 DIAGNOSIS — G4731 Primary central sleep apnea: Secondary | ICD-10-CM | POA: Diagnosis not present

## 2019-09-09 DIAGNOSIS — Z96651 Presence of right artificial knee joint: Secondary | ICD-10-CM | POA: Diagnosis not present

## 2019-09-09 DIAGNOSIS — N179 Acute kidney failure, unspecified: Secondary | ICD-10-CM | POA: Diagnosis not present

## 2019-09-09 DIAGNOSIS — J449 Chronic obstructive pulmonary disease, unspecified: Secondary | ICD-10-CM | POA: Diagnosis not present

## 2019-09-09 DIAGNOSIS — E782 Mixed hyperlipidemia: Secondary | ICD-10-CM | POA: Diagnosis not present

## 2019-09-09 DIAGNOSIS — M341 CR(E)ST syndrome: Secondary | ICD-10-CM | POA: Diagnosis not present

## 2019-09-10 DIAGNOSIS — J324 Chronic pansinusitis: Secondary | ICD-10-CM | POA: Diagnosis not present

## 2019-09-10 DIAGNOSIS — N179 Acute kidney failure, unspecified: Secondary | ICD-10-CM | POA: Diagnosis not present

## 2019-09-10 DIAGNOSIS — L89159 Pressure ulcer of sacral region, unspecified stage: Secondary | ICD-10-CM | POA: Diagnosis not present

## 2019-09-10 DIAGNOSIS — R531 Weakness: Secondary | ICD-10-CM | POA: Diagnosis not present

## 2019-09-10 DIAGNOSIS — C3490 Malignant neoplasm of unspecified part of unspecified bronchus or lung: Secondary | ICD-10-CM | POA: Diagnosis not present

## 2019-09-10 DIAGNOSIS — J189 Pneumonia, unspecified organism: Secondary | ICD-10-CM | POA: Diagnosis not present

## 2019-09-10 DIAGNOSIS — R627 Adult failure to thrive: Secondary | ICD-10-CM | POA: Diagnosis not present

## 2019-09-10 DIAGNOSIS — G4733 Obstructive sleep apnea (adult) (pediatric): Secondary | ICD-10-CM | POA: Diagnosis not present

## 2019-09-10 DIAGNOSIS — J441 Chronic obstructive pulmonary disease with (acute) exacerbation: Secondary | ICD-10-CM | POA: Diagnosis not present

## 2019-09-10 DIAGNOSIS — I10 Essential (primary) hypertension: Secondary | ICD-10-CM | POA: Diagnosis not present

## 2019-09-11 DIAGNOSIS — Z7902 Long term (current) use of antithrombotics/antiplatelets: Secondary | ICD-10-CM | POA: Diagnosis not present

## 2019-09-11 DIAGNOSIS — E1122 Type 2 diabetes mellitus with diabetic chronic kidney disease: Secondary | ICD-10-CM | POA: Diagnosis not present

## 2019-09-11 DIAGNOSIS — E869 Volume depletion, unspecified: Secondary | ICD-10-CM | POA: Diagnosis not present

## 2019-09-11 DIAGNOSIS — Z66 Do not resuscitate: Secondary | ICD-10-CM | POA: Diagnosis not present

## 2019-09-11 DIAGNOSIS — E1165 Type 2 diabetes mellitus with hyperglycemia: Secondary | ICD-10-CM | POA: Diagnosis not present

## 2019-09-11 DIAGNOSIS — E46 Unspecified protein-calorie malnutrition: Secondary | ICD-10-CM | POA: Diagnosis not present

## 2019-09-11 DIAGNOSIS — I69354 Hemiplegia and hemiparesis following cerebral infarction affecting left non-dominant side: Secondary | ICD-10-CM | POA: Diagnosis not present

## 2019-09-11 DIAGNOSIS — C3490 Malignant neoplasm of unspecified part of unspecified bronchus or lung: Secondary | ICD-10-CM | POA: Diagnosis not present

## 2019-09-11 DIAGNOSIS — J189 Pneumonia, unspecified organism: Secondary | ICD-10-CM | POA: Diagnosis not present

## 2019-09-11 DIAGNOSIS — L89159 Pressure ulcer of sacral region, unspecified stage: Secondary | ICD-10-CM | POA: Diagnosis not present

## 2019-09-11 DIAGNOSIS — E785 Hyperlipidemia, unspecified: Secondary | ICD-10-CM | POA: Diagnosis not present

## 2019-09-11 DIAGNOSIS — N183 Chronic kidney disease, stage 3 unspecified: Secondary | ICD-10-CM | POA: Diagnosis not present

## 2019-09-11 DIAGNOSIS — K219 Gastro-esophageal reflux disease without esophagitis: Secondary | ICD-10-CM | POA: Diagnosis not present

## 2019-09-11 DIAGNOSIS — E039 Hypothyroidism, unspecified: Secondary | ICD-10-CM | POA: Diagnosis not present

## 2019-09-11 DIAGNOSIS — I129 Hypertensive chronic kidney disease with stage 1 through stage 4 chronic kidney disease, or unspecified chronic kidney disease: Secondary | ICD-10-CM | POA: Diagnosis not present

## 2019-09-11 DIAGNOSIS — Z515 Encounter for palliative care: Secondary | ICD-10-CM | POA: Diagnosis not present

## 2019-09-11 DIAGNOSIS — R531 Weakness: Secondary | ICD-10-CM | POA: Diagnosis not present

## 2019-09-11 DIAGNOSIS — S72435A Nondisplaced fracture of medial condyle of left femur, initial encounter for closed fracture: Secondary | ICD-10-CM | POA: Diagnosis not present

## 2019-09-11 DIAGNOSIS — Z794 Long term (current) use of insulin: Secondary | ICD-10-CM | POA: Diagnosis not present

## 2019-09-11 DIAGNOSIS — I10 Essential (primary) hypertension: Secondary | ICD-10-CM | POA: Diagnosis not present

## 2019-09-11 DIAGNOSIS — J441 Chronic obstructive pulmonary disease with (acute) exacerbation: Secondary | ICD-10-CM | POA: Diagnosis not present

## 2019-09-11 DIAGNOSIS — Z993 Dependence on wheelchair: Secondary | ICD-10-CM | POA: Diagnosis not present

## 2019-09-11 DIAGNOSIS — Z7982 Long term (current) use of aspirin: Secondary | ICD-10-CM | POA: Diagnosis not present

## 2019-09-11 DIAGNOSIS — U071 COVID-19: Secondary | ICD-10-CM | POA: Diagnosis not present

## 2019-09-11 DIAGNOSIS — E871 Hypo-osmolality and hyponatremia: Secondary | ICD-10-CM | POA: Diagnosis not present

## 2019-09-11 DIAGNOSIS — E876 Hypokalemia: Secondary | ICD-10-CM | POA: Diagnosis not present

## 2019-09-11 DIAGNOSIS — I69322 Dysarthria following cerebral infarction: Secondary | ICD-10-CM | POA: Diagnosis not present

## 2019-09-11 DIAGNOSIS — N179 Acute kidney failure, unspecified: Secondary | ICD-10-CM | POA: Diagnosis not present

## 2019-09-11 DIAGNOSIS — I469 Cardiac arrest, cause unspecified: Secondary | ICD-10-CM | POA: Diagnosis not present

## 2019-09-11 DIAGNOSIS — Z20828 Contact with and (suspected) exposure to other viral communicable diseases: Secondary | ICD-10-CM | POA: Diagnosis not present

## 2019-09-11 DIAGNOSIS — R627 Adult failure to thrive: Secondary | ICD-10-CM | POA: Diagnosis not present

## 2019-09-11 DIAGNOSIS — E1151 Type 2 diabetes mellitus with diabetic peripheral angiopathy without gangrene: Secondary | ICD-10-CM | POA: Diagnosis not present

## 2019-09-11 DIAGNOSIS — S12000A Unspecified displaced fracture of first cervical vertebra, initial encounter for closed fracture: Secondary | ICD-10-CM | POA: Diagnosis not present

## 2019-09-12 DIAGNOSIS — I471 Supraventricular tachycardia: Secondary | ICD-10-CM | POA: Diagnosis not present

## 2019-09-13 DIAGNOSIS — E1151 Type 2 diabetes mellitus with diabetic peripheral angiopathy without gangrene: Secondary | ICD-10-CM | POA: Diagnosis not present

## 2019-09-13 DIAGNOSIS — H353221 Exudative age-related macular degeneration, left eye, with active choroidal neovascularization: Secondary | ICD-10-CM | POA: Diagnosis not present

## 2019-09-13 DIAGNOSIS — N179 Acute kidney failure, unspecified: Secondary | ICD-10-CM | POA: Diagnosis not present

## 2019-09-13 DIAGNOSIS — E11628 Type 2 diabetes mellitus with other skin complications: Secondary | ICD-10-CM | POA: Diagnosis not present

## 2019-09-13 DIAGNOSIS — R0600 Dyspnea, unspecified: Secondary | ICD-10-CM | POA: Diagnosis not present

## 2019-09-13 DIAGNOSIS — L89152 Pressure ulcer of sacral region, stage 2: Secondary | ICD-10-CM | POA: Diagnosis not present

## 2019-09-13 DIAGNOSIS — L89620 Pressure ulcer of left heel, unstageable: Secondary | ICD-10-CM | POA: Diagnosis not present

## 2019-09-13 DIAGNOSIS — M12811 Other specific arthropathies, not elsewhere classified, right shoulder: Secondary | ICD-10-CM | POA: Diagnosis not present

## 2019-09-13 DIAGNOSIS — J449 Chronic obstructive pulmonary disease, unspecified: Secondary | ICD-10-CM | POA: Diagnosis not present

## 2019-09-13 DIAGNOSIS — I872 Venous insufficiency (chronic) (peripheral): Secondary | ICD-10-CM | POA: Diagnosis not present

## 2019-09-13 DIAGNOSIS — R0902 Hypoxemia: Secondary | ICD-10-CM | POA: Diagnosis not present

## 2019-09-13 DIAGNOSIS — I5032 Chronic diastolic (congestive) heart failure: Secondary | ICD-10-CM | POA: Diagnosis not present

## 2019-09-13 DIAGNOSIS — Z4800 Encounter for change or removal of nonsurgical wound dressing: Secondary | ICD-10-CM | POA: Diagnosis not present

## 2019-09-13 DIAGNOSIS — E1122 Type 2 diabetes mellitus with diabetic chronic kidney disease: Secondary | ICD-10-CM | POA: Diagnosis not present

## 2019-09-13 DIAGNOSIS — C3492 Malignant neoplasm of unspecified part of left bronchus or lung: Secondary | ICD-10-CM | POA: Diagnosis not present

## 2019-09-13 DIAGNOSIS — I5043 Acute on chronic combined systolic (congestive) and diastolic (congestive) heart failure: Secondary | ICD-10-CM | POA: Diagnosis not present

## 2019-09-13 DIAGNOSIS — K921 Melena: Secondary | ICD-10-CM | POA: Diagnosis not present

## 2019-09-13 DIAGNOSIS — E039 Hypothyroidism, unspecified: Secondary | ICD-10-CM | POA: Diagnosis not present

## 2019-09-13 DIAGNOSIS — E119 Type 2 diabetes mellitus without complications: Secondary | ICD-10-CM | POA: Diagnosis not present

## 2019-09-13 DIAGNOSIS — E1165 Type 2 diabetes mellitus with hyperglycemia: Secondary | ICD-10-CM | POA: Diagnosis not present

## 2019-09-13 DIAGNOSIS — I771 Stricture of artery: Secondary | ICD-10-CM | POA: Diagnosis not present

## 2019-09-13 DIAGNOSIS — I130 Hypertensive heart and chronic kidney disease with heart failure and stage 1 through stage 4 chronic kidney disease, or unspecified chronic kidney disease: Secondary | ICD-10-CM | POA: Diagnosis not present

## 2019-09-13 DIAGNOSIS — L97829 Non-pressure chronic ulcer of other part of left lower leg with unspecified severity: Secondary | ICD-10-CM | POA: Diagnosis not present

## 2019-09-13 DIAGNOSIS — C189 Malignant neoplasm of colon, unspecified: Secondary | ICD-10-CM | POA: Diagnosis not present

## 2019-09-13 DIAGNOSIS — N183 Chronic kidney disease, stage 3 unspecified: Secondary | ICD-10-CM | POA: Diagnosis not present

## 2019-09-13 DIAGNOSIS — H353112 Nonexudative age-related macular degeneration, right eye, intermediate dry stage: Secondary | ICD-10-CM | POA: Diagnosis not present

## 2019-09-14 DIAGNOSIS — I714 Abdominal aortic aneurysm, without rupture: Secondary | ICD-10-CM | POA: Diagnosis not present

## 2019-09-14 DIAGNOSIS — E7800 Pure hypercholesterolemia, unspecified: Secondary | ICD-10-CM | POA: Diagnosis not present

## 2019-09-14 DIAGNOSIS — S20212D Contusion of left front wall of thorax, subsequent encounter: Secondary | ICD-10-CM | POA: Diagnosis not present

## 2019-09-14 DIAGNOSIS — E876 Hypokalemia: Secondary | ICD-10-CM | POA: Diagnosis not present

## 2019-09-14 DIAGNOSIS — B9620 Unspecified Escherichia coli [E. coli] as the cause of diseases classified elsewhere: Secondary | ICD-10-CM | POA: Diagnosis not present

## 2019-09-14 DIAGNOSIS — N189 Chronic kidney disease, unspecified: Secondary | ICD-10-CM | POA: Diagnosis not present

## 2019-09-14 DIAGNOSIS — D638 Anemia in other chronic diseases classified elsewhere: Secondary | ICD-10-CM | POA: Diagnosis not present

## 2019-09-14 DIAGNOSIS — E559 Vitamin D deficiency, unspecified: Secondary | ICD-10-CM | POA: Diagnosis not present

## 2019-09-14 DIAGNOSIS — H02403 Unspecified ptosis of bilateral eyelids: Secondary | ICD-10-CM | POA: Diagnosis not present

## 2019-09-14 DIAGNOSIS — I480 Paroxysmal atrial fibrillation: Secondary | ICD-10-CM | POA: Diagnosis not present

## 2019-09-14 DIAGNOSIS — I130 Hypertensive heart and chronic kidney disease with heart failure and stage 1 through stage 4 chronic kidney disease, or unspecified chronic kidney disease: Secondary | ICD-10-CM | POA: Diagnosis not present

## 2019-09-14 DIAGNOSIS — I5032 Chronic diastolic (congestive) heart failure: Secondary | ICD-10-CM | POA: Diagnosis not present

## 2019-09-14 DIAGNOSIS — B373 Candidiasis of vulva and vagina: Secondary | ICD-10-CM | POA: Diagnosis not present

## 2019-09-14 DIAGNOSIS — H35352 Cystoid macular degeneration, left eye: Secondary | ICD-10-CM | POA: Diagnosis not present

## 2019-09-14 DIAGNOSIS — F329 Major depressive disorder, single episode, unspecified: Secondary | ICD-10-CM | POA: Diagnosis not present

## 2019-09-14 DIAGNOSIS — Z8673 Personal history of transient ischemic attack (TIA), and cerebral infarction without residual deficits: Secondary | ICD-10-CM | POA: Diagnosis not present

## 2019-09-14 DIAGNOSIS — C642 Malignant neoplasm of left kidney, except renal pelvis: Secondary | ICD-10-CM | POA: Diagnosis not present

## 2019-09-14 DIAGNOSIS — Z933 Colostomy status: Secondary | ICD-10-CM | POA: Diagnosis not present

## 2019-09-14 DIAGNOSIS — Z87891 Personal history of nicotine dependence: Secondary | ICD-10-CM | POA: Diagnosis not present

## 2019-09-14 DIAGNOSIS — M5090 Cervical disc disorder, unspecified, unspecified cervical region: Secondary | ICD-10-CM | POA: Diagnosis not present

## 2019-09-14 DIAGNOSIS — G4733 Obstructive sleep apnea (adult) (pediatric): Secondary | ICD-10-CM | POA: Diagnosis not present

## 2019-09-14 DIAGNOSIS — M170 Bilateral primary osteoarthritis of knee: Secondary | ICD-10-CM | POA: Diagnosis not present

## 2019-09-14 DIAGNOSIS — M1990 Unspecified osteoarthritis, unspecified site: Secondary | ICD-10-CM | POA: Diagnosis not present

## 2019-09-14 DIAGNOSIS — J449 Chronic obstructive pulmonary disease, unspecified: Secondary | ICD-10-CM | POA: Diagnosis not present

## 2019-09-14 DIAGNOSIS — Z20828 Contact with and (suspected) exposure to other viral communicable diseases: Secondary | ICD-10-CM | POA: Diagnosis not present

## 2019-09-14 DIAGNOSIS — M7541 Impingement syndrome of right shoulder: Secondary | ICD-10-CM | POA: Diagnosis not present

## 2019-09-14 DIAGNOSIS — L89152 Pressure ulcer of sacral region, stage 2: Secondary | ICD-10-CM | POA: Diagnosis not present

## 2019-09-14 DIAGNOSIS — K8681 Exocrine pancreatic insufficiency: Secondary | ICD-10-CM | POA: Diagnosis not present

## 2019-09-14 DIAGNOSIS — G250 Essential tremor: Secondary | ICD-10-CM | POA: Diagnosis not present

## 2019-09-14 DIAGNOSIS — I110 Hypertensive heart disease with heart failure: Secondary | ICD-10-CM | POA: Diagnosis not present

## 2019-09-14 DIAGNOSIS — R250 Abnormal head movements: Secondary | ICD-10-CM | POA: Diagnosis not present

## 2019-09-14 DIAGNOSIS — M12811 Other specific arthropathies, not elsewhere classified, right shoulder: Secondary | ICD-10-CM | POA: Diagnosis not present

## 2019-09-14 DIAGNOSIS — M47817 Spondylosis without myelopathy or radiculopathy, lumbosacral region: Secondary | ICD-10-CM | POA: Diagnosis not present

## 2019-09-14 DIAGNOSIS — I2510 Atherosclerotic heart disease of native coronary artery without angina pectoris: Secondary | ICD-10-CM | POA: Diagnosis not present

## 2019-09-14 DIAGNOSIS — N400 Enlarged prostate without lower urinary tract symptoms: Secondary | ICD-10-CM | POA: Diagnosis not present

## 2019-09-14 DIAGNOSIS — C7889 Secondary malignant neoplasm of other digestive organs: Secondary | ICD-10-CM | POA: Diagnosis not present

## 2019-09-14 DIAGNOSIS — Z952 Presence of prosthetic heart valve: Secondary | ICD-10-CM | POA: Diagnosis not present

## 2019-09-14 DIAGNOSIS — I10 Essential (primary) hypertension: Secondary | ICD-10-CM | POA: Diagnosis not present

## 2019-09-14 DIAGNOSIS — N390 Urinary tract infection, site not specified: Secondary | ICD-10-CM | POA: Diagnosis not present

## 2019-09-14 DIAGNOSIS — E43 Unspecified severe protein-calorie malnutrition: Secondary | ICD-10-CM | POA: Diagnosis not present

## 2019-09-14 DIAGNOSIS — Z7902 Long term (current) use of antithrombotics/antiplatelets: Secondary | ICD-10-CM | POA: Diagnosis not present

## 2019-09-14 DIAGNOSIS — R55 Syncope and collapse: Secondary | ICD-10-CM | POA: Diagnosis not present

## 2019-09-14 DIAGNOSIS — E273 Drug-induced adrenocortical insufficiency: Secondary | ICD-10-CM | POA: Diagnosis not present

## 2019-09-14 DIAGNOSIS — G8929 Other chronic pain: Secondary | ICD-10-CM | POA: Diagnosis not present

## 2019-09-14 DIAGNOSIS — Z9181 History of falling: Secondary | ICD-10-CM | POA: Diagnosis not present

## 2019-09-14 DIAGNOSIS — E785 Hyperlipidemia, unspecified: Secondary | ICD-10-CM | POA: Diagnosis not present

## 2019-09-14 DIAGNOSIS — H33012 Retinal detachment with single break, left eye: Secondary | ICD-10-CM | POA: Diagnosis not present

## 2019-09-14 DIAGNOSIS — M961 Postlaminectomy syndrome, not elsewhere classified: Secondary | ICD-10-CM | POA: Diagnosis not present

## 2019-09-14 DIAGNOSIS — Z794 Long term (current) use of insulin: Secondary | ICD-10-CM | POA: Diagnosis not present

## 2019-09-14 DIAGNOSIS — N401 Enlarged prostate with lower urinary tract symptoms: Secondary | ICD-10-CM | POA: Diagnosis not present

## 2019-09-14 DIAGNOSIS — E1165 Type 2 diabetes mellitus with hyperglycemia: Secondary | ICD-10-CM | POA: Diagnosis not present

## 2019-09-14 DIAGNOSIS — K219 Gastro-esophageal reflux disease without esophagitis: Secondary | ICD-10-CM | POA: Diagnosis not present

## 2019-09-14 DIAGNOSIS — Z1231 Encounter for screening mammogram for malignant neoplasm of breast: Secondary | ICD-10-CM | POA: Diagnosis not present

## 2019-09-14 DIAGNOSIS — K861 Other chronic pancreatitis: Secondary | ICD-10-CM | POA: Diagnosis not present

## 2019-09-14 DIAGNOSIS — I5030 Unspecified diastolic (congestive) heart failure: Secondary | ICD-10-CM | POA: Diagnosis not present

## 2019-09-14 DIAGNOSIS — R600 Localized edema: Secondary | ICD-10-CM | POA: Diagnosis not present

## 2019-09-14 DIAGNOSIS — M5412 Radiculopathy, cervical region: Secondary | ICD-10-CM | POA: Diagnosis not present

## 2019-09-14 DIAGNOSIS — Z8546 Personal history of malignant neoplasm of prostate: Secondary | ICD-10-CM | POA: Diagnosis not present

## 2019-09-14 DIAGNOSIS — F0390 Unspecified dementia without behavioral disturbance: Secondary | ICD-10-CM | POA: Diagnosis not present

## 2019-09-14 DIAGNOSIS — G7000 Myasthenia gravis without (acute) exacerbation: Secondary | ICD-10-CM | POA: Diagnosis not present

## 2019-09-14 DIAGNOSIS — E0500 Thyrotoxicosis with diffuse goiter without thyrotoxic crisis or storm: Secondary | ICD-10-CM | POA: Diagnosis not present

## 2019-09-14 DIAGNOSIS — T380X5D Adverse effect of glucocorticoids and synthetic analogues, subsequent encounter: Secondary | ICD-10-CM | POA: Diagnosis not present

## 2019-09-14 DIAGNOSIS — M069 Rheumatoid arthritis, unspecified: Secondary | ICD-10-CM | POA: Diagnosis not present

## 2019-09-14 DIAGNOSIS — M47812 Spondylosis without myelopathy or radiculopathy, cervical region: Secondary | ICD-10-CM | POA: Diagnosis not present

## 2019-09-14 DIAGNOSIS — E042 Nontoxic multinodular goiter: Secondary | ICD-10-CM | POA: Diagnosis not present

## 2019-09-14 DIAGNOSIS — R338 Other retention of urine: Secondary | ICD-10-CM | POA: Diagnosis not present

## 2019-09-14 DIAGNOSIS — M797 Fibromyalgia: Secondary | ICD-10-CM | POA: Diagnosis not present

## 2019-09-14 DIAGNOSIS — Z1159 Encounter for screening for other viral diseases: Secondary | ICD-10-CM | POA: Diagnosis not present

## 2019-09-14 DIAGNOSIS — R001 Bradycardia, unspecified: Secondary | ICD-10-CM | POA: Diagnosis not present

## 2019-09-15 DIAGNOSIS — M25512 Pain in left shoulder: Secondary | ICD-10-CM | POA: Diagnosis not present

## 2019-09-15 DIAGNOSIS — R0781 Pleurodynia: Secondary | ICD-10-CM | POA: Diagnosis not present

## 2019-09-15 DIAGNOSIS — Z299 Encounter for prophylactic measures, unspecified: Secondary | ICD-10-CM | POA: Diagnosis not present

## 2019-09-15 DIAGNOSIS — R5383 Other fatigue: Secondary | ICD-10-CM | POA: Diagnosis not present

## 2019-09-15 DIAGNOSIS — E7800 Pure hypercholesterolemia, unspecified: Secondary | ICD-10-CM | POA: Diagnosis not present

## 2019-09-15 DIAGNOSIS — E039 Hypothyroidism, unspecified: Secondary | ICD-10-CM | POA: Diagnosis not present

## 2019-09-15 DIAGNOSIS — Z6824 Body mass index (BMI) 24.0-24.9, adult: Secondary | ICD-10-CM | POA: Diagnosis not present

## 2019-09-15 DIAGNOSIS — Z7189 Other specified counseling: Secondary | ICD-10-CM | POA: Diagnosis not present

## 2019-09-15 DIAGNOSIS — N390 Urinary tract infection, site not specified: Secondary | ICD-10-CM | POA: Diagnosis not present

## 2019-09-15 DIAGNOSIS — E43 Unspecified severe protein-calorie malnutrition: Secondary | ICD-10-CM | POA: Diagnosis not present

## 2019-09-15 DIAGNOSIS — J449 Chronic obstructive pulmonary disease, unspecified: Secondary | ICD-10-CM | POA: Diagnosis not present

## 2019-09-15 DIAGNOSIS — G4733 Obstructive sleep apnea (adult) (pediatric): Secondary | ICD-10-CM | POA: Diagnosis not present

## 2019-09-15 DIAGNOSIS — I10 Essential (primary) hypertension: Secondary | ICD-10-CM | POA: Diagnosis not present

## 2019-09-15 DIAGNOSIS — E559 Vitamin D deficiency, unspecified: Secondary | ICD-10-CM | POA: Diagnosis not present

## 2019-09-15 DIAGNOSIS — E538 Deficiency of other specified B group vitamins: Secondary | ICD-10-CM | POA: Diagnosis not present

## 2019-09-15 DIAGNOSIS — Z0000 Encounter for general adult medical examination without abnormal findings: Secondary | ICD-10-CM | POA: Diagnosis not present

## 2019-09-15 DIAGNOSIS — R338 Other retention of urine: Secondary | ICD-10-CM | POA: Diagnosis not present

## 2019-09-15 DIAGNOSIS — I714 Abdominal aortic aneurysm, without rupture: Secondary | ICD-10-CM | POA: Diagnosis not present

## 2019-09-15 DIAGNOSIS — D638 Anemia in other chronic diseases classified elsewhere: Secondary | ICD-10-CM | POA: Diagnosis not present

## 2019-09-15 DIAGNOSIS — Z9889 Other specified postprocedural states: Secondary | ICD-10-CM | POA: Diagnosis not present

## 2019-09-15 DIAGNOSIS — Z1211 Encounter for screening for malignant neoplasm of colon: Secondary | ICD-10-CM | POA: Diagnosis not present

## 2019-09-15 DIAGNOSIS — Z1339 Encounter for screening examination for other mental health and behavioral disorders: Secondary | ICD-10-CM | POA: Diagnosis not present

## 2019-09-15 DIAGNOSIS — E7210 Disorders of sulfur-bearing amino-acid metabolism, unspecified: Secondary | ICD-10-CM | POA: Diagnosis not present

## 2019-09-15 DIAGNOSIS — N401 Enlarged prostate with lower urinary tract symptoms: Secondary | ICD-10-CM | POA: Diagnosis not present

## 2019-09-15 DIAGNOSIS — Z1331 Encounter for screening for depression: Secondary | ICD-10-CM | POA: Diagnosis not present

## 2019-09-15 DIAGNOSIS — L89152 Pressure ulcer of sacral region, stage 2: Secondary | ICD-10-CM | POA: Diagnosis not present

## 2019-09-15 DIAGNOSIS — K219 Gastro-esophageal reflux disease without esophagitis: Secondary | ICD-10-CM | POA: Diagnosis not present

## 2019-09-15 DIAGNOSIS — M810 Age-related osteoporosis without current pathological fracture: Secondary | ICD-10-CM | POA: Diagnosis not present

## 2019-09-15 DIAGNOSIS — B9620 Unspecified Escherichia coli [E. coli] as the cause of diseases classified elsewhere: Secondary | ICD-10-CM | POA: Diagnosis not present

## 2019-09-15 DIAGNOSIS — F329 Major depressive disorder, single episode, unspecified: Secondary | ICD-10-CM | POA: Diagnosis not present

## 2019-09-15 DIAGNOSIS — F0390 Unspecified dementia without behavioral disturbance: Secondary | ICD-10-CM | POA: Diagnosis not present

## 2019-09-15 DIAGNOSIS — N951 Menopausal and female climacteric states: Secondary | ICD-10-CM | POA: Diagnosis not present

## 2019-09-15 DIAGNOSIS — E279 Disorder of adrenal gland, unspecified: Secondary | ICD-10-CM | POA: Diagnosis not present

## 2019-09-15 DIAGNOSIS — E785 Hyperlipidemia, unspecified: Secondary | ICD-10-CM | POA: Diagnosis not present

## 2019-09-16 DIAGNOSIS — R530 Neoplastic (malignant) related fatigue: Secondary | ICD-10-CM | POA: Diagnosis not present

## 2019-09-16 DIAGNOSIS — M1990 Unspecified osteoarthritis, unspecified site: Secondary | ICD-10-CM | POA: Diagnosis not present

## 2019-09-16 DIAGNOSIS — G8194 Hemiplegia, unspecified affecting left nondominant side: Secondary | ICD-10-CM | POA: Diagnosis not present

## 2019-09-16 DIAGNOSIS — M21372 Foot drop, left foot: Secondary | ICD-10-CM | POA: Diagnosis not present

## 2019-09-16 DIAGNOSIS — C3431 Malignant neoplasm of lower lobe, right bronchus or lung: Secondary | ICD-10-CM | POA: Diagnosis not present

## 2019-09-16 DIAGNOSIS — C3492 Malignant neoplasm of unspecified part of left bronchus or lung: Secondary | ICD-10-CM | POA: Diagnosis not present

## 2019-09-16 DIAGNOSIS — D649 Anemia, unspecified: Secondary | ICD-10-CM | POA: Diagnosis not present

## 2019-09-16 DIAGNOSIS — Z20828 Contact with and (suspected) exposure to other viral communicable diseases: Secondary | ICD-10-CM | POA: Diagnosis not present

## 2019-09-16 DIAGNOSIS — S72452D Displaced supracondylar fracture without intracondylar extension of lower end of left femur, subsequent encounter for closed fracture with routine healing: Secondary | ICD-10-CM | POA: Diagnosis not present

## 2019-09-16 DIAGNOSIS — R4182 Altered mental status, unspecified: Secondary | ICD-10-CM | POA: Diagnosis not present

## 2019-09-16 DIAGNOSIS — S22080S Wedge compression fracture of T11-T12 vertebra, sequela: Secondary | ICD-10-CM | POA: Diagnosis not present

## 2019-09-16 DIAGNOSIS — J449 Chronic obstructive pulmonary disease, unspecified: Secondary | ICD-10-CM | POA: Diagnosis not present

## 2019-09-16 DIAGNOSIS — I10 Essential (primary) hypertension: Secondary | ICD-10-CM | POA: Diagnosis not present

## 2019-09-16 DIAGNOSIS — G4733 Obstructive sleep apnea (adult) (pediatric): Secondary | ICD-10-CM | POA: Diagnosis not present

## 2019-09-16 DIAGNOSIS — Z9181 History of falling: Secondary | ICD-10-CM | POA: Diagnosis not present

## 2019-09-16 DIAGNOSIS — S14109S Unspecified injury at unspecified level of cervical spinal cord, sequela: Secondary | ICD-10-CM | POA: Diagnosis not present

## 2019-09-16 DIAGNOSIS — C159 Malignant neoplasm of esophagus, unspecified: Secondary | ICD-10-CM | POA: Diagnosis not present

## 2019-09-16 DIAGNOSIS — C3432 Malignant neoplasm of lower lobe, left bronchus or lung: Secondary | ICD-10-CM | POA: Diagnosis not present

## 2019-09-16 DIAGNOSIS — M6281 Muscle weakness (generalized): Secondary | ICD-10-CM | POA: Diagnosis not present

## 2019-09-16 DIAGNOSIS — K219 Gastro-esophageal reflux disease without esophagitis: Secondary | ICD-10-CM | POA: Diagnosis not present

## 2019-09-16 DIAGNOSIS — Z483 Aftercare following surgery for neoplasm: Secondary | ICD-10-CM | POA: Diagnosis not present

## 2019-09-16 DIAGNOSIS — F331 Major depressive disorder, recurrent, moderate: Secondary | ICD-10-CM | POA: Diagnosis not present

## 2019-09-16 DIAGNOSIS — F17210 Nicotine dependence, cigarettes, uncomplicated: Secondary | ICD-10-CM | POA: Diagnosis not present

## 2019-09-16 DIAGNOSIS — R82998 Other abnormal findings in urine: Secondary | ICD-10-CM | POA: Diagnosis not present

## 2019-09-16 DIAGNOSIS — M9712XD Periprosthetic fracture around internal prosthetic left knee joint, subsequent encounter: Secondary | ICD-10-CM | POA: Diagnosis not present

## 2019-09-17 DIAGNOSIS — Z87891 Personal history of nicotine dependence: Secondary | ICD-10-CM | POA: Diagnosis not present

## 2019-09-17 DIAGNOSIS — I10 Essential (primary) hypertension: Secondary | ICD-10-CM | POA: Diagnosis not present

## 2019-09-17 DIAGNOSIS — G250 Essential tremor: Secondary | ICD-10-CM | POA: Diagnosis not present

## 2019-09-17 DIAGNOSIS — G4700 Insomnia, unspecified: Secondary | ICD-10-CM | POA: Diagnosis not present

## 2019-09-17 DIAGNOSIS — K219 Gastro-esophageal reflux disease without esophagitis: Secondary | ICD-10-CM | POA: Diagnosis not present

## 2019-09-17 DIAGNOSIS — Z9181 History of falling: Secondary | ICD-10-CM | POA: Diagnosis not present

## 2019-09-17 DIAGNOSIS — M1990 Unspecified osteoarthritis, unspecified site: Secondary | ICD-10-CM | POA: Diagnosis not present

## 2019-09-17 DIAGNOSIS — M80031D Age-related osteoporosis with current pathological fracture, right forearm, subsequent encounter for fracture with routine healing: Secondary | ICD-10-CM | POA: Diagnosis not present

## 2019-09-17 DIAGNOSIS — K5730 Diverticulosis of large intestine without perforation or abscess without bleeding: Secondary | ICD-10-CM | POA: Diagnosis not present

## 2019-09-17 DIAGNOSIS — G4733 Obstructive sleep apnea (adult) (pediatric): Secondary | ICD-10-CM | POA: Diagnosis not present

## 2019-09-17 DIAGNOSIS — J432 Centrilobular emphysema: Secondary | ICD-10-CM | POA: Diagnosis not present

## 2019-09-17 DIAGNOSIS — M25562 Pain in left knee: Secondary | ICD-10-CM | POA: Diagnosis not present

## 2019-09-17 DIAGNOSIS — J9610 Chronic respiratory failure, unspecified whether with hypoxia or hypercapnia: Secondary | ICD-10-CM | POA: Diagnosis not present

## 2019-09-18 DIAGNOSIS — I340 Nonrheumatic mitral (valve) insufficiency: Secondary | ICD-10-CM | POA: Diagnosis not present

## 2019-09-18 DIAGNOSIS — I70213 Atherosclerosis of native arteries of extremities with intermittent claudication, bilateral legs: Secondary | ICD-10-CM | POA: Diagnosis not present

## 2019-09-18 DIAGNOSIS — N183 Chronic kidney disease, stage 3 unspecified: Secondary | ICD-10-CM | POA: Diagnosis not present

## 2019-09-18 DIAGNOSIS — E1122 Type 2 diabetes mellitus with diabetic chronic kidney disease: Secondary | ICD-10-CM | POA: Diagnosis not present

## 2019-09-18 DIAGNOSIS — E785 Hyperlipidemia, unspecified: Secondary | ICD-10-CM | POA: Diagnosis not present

## 2019-09-18 DIAGNOSIS — I252 Old myocardial infarction: Secondary | ICD-10-CM | POA: Diagnosis not present

## 2019-09-18 DIAGNOSIS — I130 Hypertensive heart and chronic kidney disease with heart failure and stage 1 through stage 4 chronic kidney disease, or unspecified chronic kidney disease: Secondary | ICD-10-CM | POA: Diagnosis not present

## 2019-09-18 DIAGNOSIS — F329 Major depressive disorder, single episode, unspecified: Secondary | ICD-10-CM | POA: Diagnosis not present

## 2019-09-18 DIAGNOSIS — I5022 Chronic systolic (congestive) heart failure: Secondary | ICD-10-CM | POA: Diagnosis not present

## 2019-09-18 DIAGNOSIS — I472 Ventricular tachycardia: Secondary | ICD-10-CM | POA: Diagnosis not present

## 2019-09-18 DIAGNOSIS — I2510 Atherosclerotic heart disease of native coronary artery without angina pectoris: Secondary | ICD-10-CM | POA: Diagnosis not present

## 2019-09-18 DIAGNOSIS — E1151 Type 2 diabetes mellitus with diabetic peripheral angiopathy without gangrene: Secondary | ICD-10-CM | POA: Diagnosis not present

## 2019-09-18 DIAGNOSIS — J449 Chronic obstructive pulmonary disease, unspecified: Secondary | ICD-10-CM | POA: Diagnosis not present

## 2019-09-19 DIAGNOSIS — M549 Dorsalgia, unspecified: Secondary | ICD-10-CM | POA: Diagnosis not present

## 2019-09-19 DIAGNOSIS — G7000 Myasthenia gravis without (acute) exacerbation: Secondary | ICD-10-CM | POA: Diagnosis not present

## 2019-09-19 DIAGNOSIS — Z6841 Body Mass Index (BMI) 40.0 and over, adult: Secondary | ICD-10-CM | POA: Diagnosis not present

## 2019-09-19 DIAGNOSIS — N200 Calculus of kidney: Secondary | ICD-10-CM | POA: Diagnosis not present

## 2019-09-19 DIAGNOSIS — A409 Streptococcal sepsis, unspecified: Secondary | ICD-10-CM | POA: Diagnosis not present

## 2019-09-19 DIAGNOSIS — G20 Parkinson's disease: Secondary | ICD-10-CM | POA: Diagnosis not present

## 2019-09-19 DIAGNOSIS — J449 Chronic obstructive pulmonary disease, unspecified: Secondary | ICD-10-CM | POA: Diagnosis not present

## 2019-09-19 DIAGNOSIS — I480 Paroxysmal atrial fibrillation: Secondary | ICD-10-CM | POA: Diagnosis not present

## 2019-09-19 DIAGNOSIS — I350 Nonrheumatic aortic (valve) stenosis: Secondary | ICD-10-CM | POA: Diagnosis not present

## 2019-09-19 DIAGNOSIS — Z7952 Long term (current) use of systemic steroids: Secondary | ICD-10-CM | POA: Diagnosis not present

## 2019-09-19 DIAGNOSIS — I890 Lymphedema, not elsewhere classified: Secondary | ICD-10-CM | POA: Diagnosis not present

## 2019-09-19 DIAGNOSIS — H02403 Unspecified ptosis of bilateral eyelids: Secondary | ICD-10-CM | POA: Diagnosis not present

## 2019-09-19 DIAGNOSIS — Z7982 Long term (current) use of aspirin: Secondary | ICD-10-CM | POA: Diagnosis not present

## 2019-09-19 DIAGNOSIS — I509 Heart failure, unspecified: Secondary | ICD-10-CM | POA: Diagnosis not present

## 2019-09-19 DIAGNOSIS — R339 Retention of urine, unspecified: Secondary | ICD-10-CM | POA: Diagnosis not present

## 2019-09-19 DIAGNOSIS — I130 Hypertensive heart and chronic kidney disease with heart failure and stage 1 through stage 4 chronic kidney disease, or unspecified chronic kidney disease: Secondary | ICD-10-CM | POA: Diagnosis not present

## 2019-09-19 DIAGNOSIS — I10 Essential (primary) hypertension: Secondary | ICD-10-CM | POA: Diagnosis not present

## 2019-09-19 DIAGNOSIS — K861 Other chronic pancreatitis: Secondary | ICD-10-CM | POA: Diagnosis not present

## 2019-09-19 DIAGNOSIS — Z7984 Long term (current) use of oral hypoglycemic drugs: Secondary | ICD-10-CM | POA: Diagnosis not present

## 2019-09-19 DIAGNOSIS — I272 Pulmonary hypertension, unspecified: Secondary | ICD-10-CM | POA: Diagnosis not present

## 2019-09-19 DIAGNOSIS — J9621 Acute and chronic respiratory failure with hypoxia: Secondary | ICD-10-CM | POA: Diagnosis not present

## 2019-09-19 DIAGNOSIS — I2510 Atherosclerotic heart disease of native coronary artery without angina pectoris: Secondary | ICD-10-CM | POA: Diagnosis not present

## 2019-09-19 DIAGNOSIS — Z9181 History of falling: Secondary | ICD-10-CM | POA: Diagnosis not present

## 2019-09-19 DIAGNOSIS — I110 Hypertensive heart disease with heart failure: Secondary | ICD-10-CM | POA: Diagnosis not present

## 2019-09-19 DIAGNOSIS — J189 Pneumonia, unspecified organism: Secondary | ICD-10-CM | POA: Diagnosis not present

## 2019-09-19 DIAGNOSIS — I129 Hypertensive chronic kidney disease with stage 1 through stage 4 chronic kidney disease, or unspecified chronic kidney disease: Secondary | ICD-10-CM | POA: Diagnosis not present

## 2019-09-19 DIAGNOSIS — I132 Hypertensive heart and chronic kidney disease with heart failure and with stage 5 chronic kidney disease, or end stage renal disease: Secondary | ICD-10-CM | POA: Diagnosis not present

## 2019-09-19 DIAGNOSIS — D649 Anemia, unspecified: Secondary | ICD-10-CM | POA: Diagnosis not present

## 2019-09-19 DIAGNOSIS — T380X5D Adverse effect of glucocorticoids and synthetic analogues, subsequent encounter: Secondary | ICD-10-CM | POA: Diagnosis not present

## 2019-09-19 DIAGNOSIS — N186 End stage renal disease: Secondary | ICD-10-CM | POA: Diagnosis not present

## 2019-09-19 DIAGNOSIS — E119 Type 2 diabetes mellitus without complications: Secondary | ICD-10-CM | POA: Diagnosis not present

## 2019-09-19 DIAGNOSIS — Z452 Encounter for adjustment and management of vascular access device: Secondary | ICD-10-CM | POA: Diagnosis not present

## 2019-09-19 DIAGNOSIS — M5136 Other intervertebral disc degeneration, lumbar region: Secondary | ICD-10-CM | POA: Diagnosis not present

## 2019-09-19 DIAGNOSIS — I5030 Unspecified diastolic (congestive) heart failure: Secondary | ICD-10-CM | POA: Diagnosis not present

## 2019-09-19 DIAGNOSIS — I82402 Acute embolism and thrombosis of unspecified deep veins of left lower extremity: Secondary | ICD-10-CM | POA: Diagnosis not present

## 2019-09-19 DIAGNOSIS — I482 Chronic atrial fibrillation, unspecified: Secondary | ICD-10-CM | POA: Diagnosis not present

## 2019-09-19 DIAGNOSIS — Z87891 Personal history of nicotine dependence: Secondary | ICD-10-CM | POA: Diagnosis not present

## 2019-09-19 DIAGNOSIS — M109 Gout, unspecified: Secondary | ICD-10-CM | POA: Diagnosis not present

## 2019-09-19 DIAGNOSIS — E1151 Type 2 diabetes mellitus with diabetic peripheral angiopathy without gangrene: Secondary | ICD-10-CM | POA: Diagnosis not present

## 2019-09-19 DIAGNOSIS — I5032 Chronic diastolic (congestive) heart failure: Secondary | ICD-10-CM | POA: Diagnosis not present

## 2019-09-19 DIAGNOSIS — C642 Malignant neoplasm of left kidney, except renal pelvis: Secondary | ICD-10-CM | POA: Diagnosis not present

## 2019-09-19 DIAGNOSIS — R250 Abnormal head movements: Secondary | ICD-10-CM | POA: Diagnosis not present

## 2019-09-19 DIAGNOSIS — B965 Pseudomonas (aeruginosa) (mallei) (pseudomallei) as the cause of diseases classified elsewhere: Secondary | ICD-10-CM | POA: Diagnosis not present

## 2019-09-19 DIAGNOSIS — U071 COVID-19: Secondary | ICD-10-CM | POA: Diagnosis not present

## 2019-09-19 DIAGNOSIS — Z79891 Long term (current) use of opiate analgesic: Secondary | ICD-10-CM | POA: Diagnosis not present

## 2019-09-19 DIAGNOSIS — J440 Chronic obstructive pulmonary disease with acute lower respiratory infection: Secondary | ICD-10-CM | POA: Diagnosis not present

## 2019-09-19 DIAGNOSIS — B9620 Unspecified Escherichia coli [E. coli] as the cause of diseases classified elsewhere: Secondary | ICD-10-CM | POA: Diagnosis not present

## 2019-09-19 DIAGNOSIS — E1122 Type 2 diabetes mellitus with diabetic chronic kidney disease: Secondary | ICD-10-CM | POA: Diagnosis not present

## 2019-09-19 DIAGNOSIS — E0500 Thyrotoxicosis with diffuse goiter without thyrotoxic crisis or storm: Secondary | ICD-10-CM | POA: Diagnosis not present

## 2019-09-19 DIAGNOSIS — C3431 Malignant neoplasm of lower lobe, right bronchus or lung: Secondary | ICD-10-CM | POA: Diagnosis not present

## 2019-09-19 DIAGNOSIS — E042 Nontoxic multinodular goiter: Secondary | ICD-10-CM | POA: Diagnosis not present

## 2019-09-19 DIAGNOSIS — E785 Hyperlipidemia, unspecified: Secondary | ICD-10-CM | POA: Diagnosis not present

## 2019-09-19 DIAGNOSIS — J441 Chronic obstructive pulmonary disease with (acute) exacerbation: Secondary | ICD-10-CM | POA: Diagnosis not present

## 2019-09-19 DIAGNOSIS — I5033 Acute on chronic diastolic (congestive) heart failure: Secondary | ICD-10-CM | POA: Diagnosis not present

## 2019-09-19 DIAGNOSIS — C7889 Secondary malignant neoplasm of other digestive organs: Secondary | ICD-10-CM | POA: Diagnosis not present

## 2019-09-19 DIAGNOSIS — E1140 Type 2 diabetes mellitus with diabetic neuropathy, unspecified: Secondary | ICD-10-CM | POA: Diagnosis not present

## 2019-09-19 DIAGNOSIS — R310 Gross hematuria: Secondary | ICD-10-CM | POA: Diagnosis not present

## 2019-09-19 DIAGNOSIS — Z483 Aftercare following surgery for neoplasm: Secondary | ICD-10-CM | POA: Diagnosis not present

## 2019-09-19 DIAGNOSIS — D6859 Other primary thrombophilia: Secondary | ICD-10-CM | POA: Diagnosis not present

## 2019-09-19 DIAGNOSIS — I4581 Long QT syndrome: Secondary | ICD-10-CM | POA: Diagnosis not present

## 2019-09-19 DIAGNOSIS — Z48815 Encounter for surgical aftercare following surgery on the digestive system: Secondary | ICD-10-CM | POA: Diagnosis not present

## 2019-09-19 DIAGNOSIS — E273 Drug-induced adrenocortical insufficiency: Secondary | ICD-10-CM | POA: Diagnosis not present

## 2019-09-19 DIAGNOSIS — N189 Chronic kidney disease, unspecified: Secondary | ICD-10-CM | POA: Diagnosis not present

## 2019-09-19 DIAGNOSIS — C187 Malignant neoplasm of sigmoid colon: Secondary | ICD-10-CM | POA: Diagnosis not present

## 2019-09-19 DIAGNOSIS — J9601 Acute respiratory failure with hypoxia: Secondary | ICD-10-CM | POA: Diagnosis not present

## 2019-09-19 DIAGNOSIS — K219 Gastro-esophageal reflux disease without esophagitis: Secondary | ICD-10-CM | POA: Diagnosis not present

## 2019-09-19 DIAGNOSIS — F0280 Dementia in other diseases classified elsewhere without behavioral disturbance: Secondary | ICD-10-CM | POA: Diagnosis not present

## 2019-09-19 DIAGNOSIS — C189 Malignant neoplasm of colon, unspecified: Secondary | ICD-10-CM | POA: Diagnosis not present

## 2019-09-19 DIAGNOSIS — M86171 Other acute osteomyelitis, right ankle and foot: Secondary | ICD-10-CM | POA: Diagnosis not present

## 2019-09-19 DIAGNOSIS — G8929 Other chronic pain: Secondary | ICD-10-CM | POA: Diagnosis not present

## 2019-09-19 DIAGNOSIS — D510 Vitamin B12 deficiency anemia due to intrinsic factor deficiency: Secondary | ICD-10-CM | POA: Diagnosis not present

## 2019-09-19 DIAGNOSIS — D631 Anemia in chronic kidney disease: Secondary | ICD-10-CM | POA: Diagnosis not present

## 2019-09-19 DIAGNOSIS — I872 Venous insufficiency (chronic) (peripheral): Secondary | ICD-10-CM | POA: Diagnosis not present

## 2019-09-19 DIAGNOSIS — J849 Interstitial pulmonary disease, unspecified: Secondary | ICD-10-CM | POA: Diagnosis not present

## 2019-09-19 DIAGNOSIS — Z7951 Long term (current) use of inhaled steroids: Secondary | ICD-10-CM | POA: Diagnosis not present

## 2019-09-19 DIAGNOSIS — F418 Other specified anxiety disorders: Secondary | ICD-10-CM | POA: Diagnosis not present

## 2019-09-19 DIAGNOSIS — G629 Polyneuropathy, unspecified: Secondary | ICD-10-CM | POA: Diagnosis not present

## 2019-09-19 DIAGNOSIS — E1121 Type 2 diabetes mellitus with diabetic nephropathy: Secondary | ICD-10-CM | POA: Diagnosis not present

## 2019-09-19 DIAGNOSIS — D696 Thrombocytopenia, unspecified: Secondary | ICD-10-CM | POA: Diagnosis not present

## 2019-09-19 DIAGNOSIS — H9190 Unspecified hearing loss, unspecified ear: Secondary | ICD-10-CM | POA: Diagnosis not present

## 2019-09-19 DIAGNOSIS — F419 Anxiety disorder, unspecified: Secondary | ICD-10-CM | POA: Diagnosis not present

## 2019-09-19 DIAGNOSIS — F329 Major depressive disorder, single episode, unspecified: Secondary | ICD-10-CM | POA: Diagnosis not present

## 2019-09-19 DIAGNOSIS — D469 Myelodysplastic syndrome, unspecified: Secondary | ICD-10-CM | POA: Diagnosis not present

## 2019-09-19 DIAGNOSIS — Z4800 Encounter for change or removal of nonsurgical wound dressing: Secondary | ICD-10-CM | POA: Diagnosis not present

## 2019-09-19 DIAGNOSIS — M1A9XX0 Chronic gout, unspecified, without tophus (tophi): Secondary | ICD-10-CM | POA: Diagnosis not present

## 2019-09-19 DIAGNOSIS — G9341 Metabolic encephalopathy: Secondary | ICD-10-CM | POA: Diagnosis not present

## 2019-09-19 DIAGNOSIS — K8681 Exocrine pancreatic insufficiency: Secondary | ICD-10-CM | POA: Diagnosis not present

## 2019-09-19 DIAGNOSIS — R338 Other retention of urine: Secondary | ICD-10-CM | POA: Diagnosis not present

## 2019-09-19 DIAGNOSIS — M1990 Unspecified osteoarthritis, unspecified site: Secondary | ICD-10-CM | POA: Diagnosis not present

## 2019-09-19 DIAGNOSIS — N179 Acute kidney failure, unspecified: Secondary | ICD-10-CM | POA: Diagnosis not present

## 2019-09-19 DIAGNOSIS — K388 Other specified diseases of appendix: Secondary | ICD-10-CM | POA: Diagnosis not present

## 2019-09-19 DIAGNOSIS — M71112 Other infective bursitis, left shoulder: Secondary | ICD-10-CM | POA: Diagnosis not present

## 2019-09-19 DIAGNOSIS — N401 Enlarged prostate with lower urinary tract symptoms: Secondary | ICD-10-CM | POA: Diagnosis not present

## 2019-09-19 DIAGNOSIS — F17210 Nicotine dependence, cigarettes, uncomplicated: Secondary | ICD-10-CM | POA: Diagnosis not present

## 2019-09-19 DIAGNOSIS — N1832 Chronic kidney disease, stage 3b: Secondary | ICD-10-CM | POA: Diagnosis not present

## 2019-09-19 DIAGNOSIS — G4730 Sleep apnea, unspecified: Secondary | ICD-10-CM | POA: Diagnosis not present

## 2019-09-19 DIAGNOSIS — K5720 Diverticulitis of large intestine with perforation and abscess without bleeding: Secondary | ICD-10-CM | POA: Diagnosis not present

## 2019-09-19 DIAGNOSIS — I492 Junctional premature depolarization: Secondary | ICD-10-CM | POA: Diagnosis not present

## 2019-09-19 DIAGNOSIS — E039 Hypothyroidism, unspecified: Secondary | ICD-10-CM | POA: Diagnosis not present

## 2019-09-19 DIAGNOSIS — L03115 Cellulitis of right lower limb: Secondary | ICD-10-CM | POA: Diagnosis not present

## 2019-09-19 DIAGNOSIS — E876 Hypokalemia: Secondary | ICD-10-CM | POA: Diagnosis not present

## 2019-09-19 DIAGNOSIS — H548 Legal blindness, as defined in USA: Secondary | ICD-10-CM | POA: Diagnosis not present

## 2019-09-19 DIAGNOSIS — Z8701 Personal history of pneumonia (recurrent): Secondary | ICD-10-CM | POA: Diagnosis not present

## 2019-09-19 DIAGNOSIS — N390 Urinary tract infection, site not specified: Secondary | ICD-10-CM | POA: Diagnosis not present

## 2019-09-19 DIAGNOSIS — N183 Chronic kidney disease, stage 3 unspecified: Secondary | ICD-10-CM | POA: Diagnosis not present

## 2019-09-19 DIAGNOSIS — I471 Supraventricular tachycardia: Secondary | ICD-10-CM | POA: Diagnosis not present

## 2019-09-19 DIAGNOSIS — J9622 Acute and chronic respiratory failure with hypercapnia: Secondary | ICD-10-CM | POA: Diagnosis not present

## 2019-09-19 DIAGNOSIS — K222 Esophageal obstruction: Secondary | ICD-10-CM | POA: Diagnosis not present

## 2019-09-19 DIAGNOSIS — K5900 Constipation, unspecified: Secondary | ICD-10-CM | POA: Diagnosis not present

## 2019-09-19 DIAGNOSIS — K921 Melena: Secondary | ICD-10-CM | POA: Diagnosis not present

## 2019-09-19 DIAGNOSIS — E1165 Type 2 diabetes mellitus with hyperglycemia: Secondary | ICD-10-CM | POA: Diagnosis not present

## 2019-09-19 DIAGNOSIS — Z9981 Dependence on supplemental oxygen: Secondary | ICD-10-CM | POA: Diagnosis not present

## 2019-09-19 DIAGNOSIS — L97829 Non-pressure chronic ulcer of other part of left lower leg with unspecified severity: Secondary | ICD-10-CM | POA: Diagnosis not present

## 2019-09-19 DIAGNOSIS — I6523 Occlusion and stenosis of bilateral carotid arteries: Secondary | ICD-10-CM | POA: Diagnosis not present

## 2019-09-20 DIAGNOSIS — E785 Hyperlipidemia, unspecified: Secondary | ICD-10-CM | POA: Diagnosis not present

## 2019-09-20 DIAGNOSIS — M6281 Muscle weakness (generalized): Secondary | ICD-10-CM | POA: Diagnosis not present

## 2019-09-20 DIAGNOSIS — Z961 Presence of intraocular lens: Secondary | ICD-10-CM | POA: Diagnosis not present

## 2019-09-20 DIAGNOSIS — E43 Unspecified severe protein-calorie malnutrition: Secondary | ICD-10-CM | POA: Diagnosis not present

## 2019-09-20 DIAGNOSIS — Z96611 Presence of right artificial shoulder joint: Secondary | ICD-10-CM | POA: Diagnosis not present

## 2019-09-20 DIAGNOSIS — J449 Chronic obstructive pulmonary disease, unspecified: Secondary | ICD-10-CM | POA: Diagnosis not present

## 2019-09-20 DIAGNOSIS — F329 Major depressive disorder, single episode, unspecified: Secondary | ICD-10-CM | POA: Diagnosis not present

## 2019-09-20 DIAGNOSIS — M80031D Age-related osteoporosis with current pathological fracture, right forearm, subsequent encounter for fracture with routine healing: Secondary | ICD-10-CM | POA: Diagnosis not present

## 2019-09-20 DIAGNOSIS — G4700 Insomnia, unspecified: Secondary | ICD-10-CM | POA: Diagnosis not present

## 2019-09-20 DIAGNOSIS — M1990 Unspecified osteoarthritis, unspecified site: Secondary | ICD-10-CM | POA: Diagnosis not present

## 2019-09-20 DIAGNOSIS — K5730 Diverticulosis of large intestine without perforation or abscess without bleeding: Secondary | ICD-10-CM | POA: Diagnosis not present

## 2019-09-20 DIAGNOSIS — F0391 Unspecified dementia with behavioral disturbance: Secondary | ICD-10-CM | POA: Diagnosis not present

## 2019-09-20 DIAGNOSIS — R338 Other retention of urine: Secondary | ICD-10-CM | POA: Diagnosis not present

## 2019-09-20 DIAGNOSIS — M12811 Other specific arthropathies, not elsewhere classified, right shoulder: Secondary | ICD-10-CM | POA: Diagnosis not present

## 2019-09-20 DIAGNOSIS — D638 Anemia in other chronic diseases classified elsewhere: Secondary | ICD-10-CM | POA: Diagnosis not present

## 2019-09-20 DIAGNOSIS — R2689 Other abnormalities of gait and mobility: Secondary | ICD-10-CM | POA: Diagnosis not present

## 2019-09-20 DIAGNOSIS — Z0000 Encounter for general adult medical examination without abnormal findings: Secondary | ICD-10-CM | POA: Diagnosis not present

## 2019-09-20 DIAGNOSIS — M86472 Chronic osteomyelitis with draining sinus, left ankle and foot: Secondary | ICD-10-CM | POA: Diagnosis not present

## 2019-09-20 DIAGNOSIS — Z471 Aftercare following joint replacement surgery: Secondary | ICD-10-CM | POA: Diagnosis not present

## 2019-09-20 DIAGNOSIS — M25511 Pain in right shoulder: Secondary | ICD-10-CM | POA: Diagnosis not present

## 2019-09-20 DIAGNOSIS — L89152 Pressure ulcer of sacral region, stage 2: Secondary | ICD-10-CM | POA: Diagnosis not present

## 2019-09-20 DIAGNOSIS — G4733 Obstructive sleep apnea (adult) (pediatric): Secondary | ICD-10-CM | POA: Diagnosis not present

## 2019-09-20 DIAGNOSIS — N401 Enlarged prostate with lower urinary tract symptoms: Secondary | ICD-10-CM | POA: Diagnosis not present

## 2019-09-20 DIAGNOSIS — J9610 Chronic respiratory failure, unspecified whether with hypoxia or hypercapnia: Secondary | ICD-10-CM | POA: Diagnosis not present

## 2019-09-20 DIAGNOSIS — J432 Centrilobular emphysema: Secondary | ICD-10-CM | POA: Diagnosis not present

## 2019-09-20 DIAGNOSIS — R2681 Unsteadiness on feet: Secondary | ICD-10-CM | POA: Diagnosis not present

## 2019-09-20 DIAGNOSIS — K219 Gastro-esophageal reflux disease without esophagitis: Secondary | ICD-10-CM | POA: Diagnosis not present

## 2019-09-20 DIAGNOSIS — Z466 Encounter for fitting and adjustment of urinary device: Secondary | ICD-10-CM | POA: Diagnosis not present

## 2019-09-20 DIAGNOSIS — I714 Abdominal aortic aneurysm, without rupture: Secondary | ICD-10-CM | POA: Diagnosis not present

## 2019-09-20 DIAGNOSIS — I951 Orthostatic hypotension: Secondary | ICD-10-CM | POA: Diagnosis not present

## 2019-09-20 DIAGNOSIS — G250 Essential tremor: Secondary | ICD-10-CM | POA: Diagnosis not present

## 2019-09-20 DIAGNOSIS — I10 Essential (primary) hypertension: Secondary | ICD-10-CM | POA: Diagnosis not present

## 2019-09-20 DIAGNOSIS — M86272 Subacute osteomyelitis, left ankle and foot: Secondary | ICD-10-CM | POA: Diagnosis not present

## 2019-09-20 DIAGNOSIS — Z87891 Personal history of nicotine dependence: Secondary | ICD-10-CM | POA: Diagnosis not present

## 2019-09-20 DIAGNOSIS — Z9181 History of falling: Secondary | ICD-10-CM | POA: Diagnosis not present

## 2019-09-21 DIAGNOSIS — Z23 Encounter for immunization: Secondary | ICD-10-CM | POA: Diagnosis not present

## 2019-09-21 DIAGNOSIS — Z96652 Presence of left artificial knee joint: Secondary | ICD-10-CM | POA: Diagnosis not present

## 2019-09-21 DIAGNOSIS — I110 Hypertensive heart disease with heart failure: Secondary | ICD-10-CM | POA: Diagnosis not present

## 2019-09-21 DIAGNOSIS — S52532D Colles' fracture of left radius, subsequent encounter for closed fracture with routine healing: Secondary | ICD-10-CM | POA: Diagnosis not present

## 2019-09-21 DIAGNOSIS — I340 Nonrheumatic mitral (valve) insufficiency: Secondary | ICD-10-CM | POA: Diagnosis not present

## 2019-09-21 DIAGNOSIS — F4310 Post-traumatic stress disorder, unspecified: Secondary | ICD-10-CM | POA: Diagnosis not present

## 2019-09-21 DIAGNOSIS — F419 Anxiety disorder, unspecified: Secondary | ICD-10-CM | POA: Diagnosis not present

## 2019-09-21 DIAGNOSIS — N183 Chronic kidney disease, stage 3 unspecified: Secondary | ICD-10-CM | POA: Diagnosis not present

## 2019-09-21 DIAGNOSIS — Z8546 Personal history of malignant neoplasm of prostate: Secondary | ICD-10-CM | POA: Diagnosis not present

## 2019-09-21 DIAGNOSIS — N400 Enlarged prostate without lower urinary tract symptoms: Secondary | ICD-10-CM | POA: Diagnosis not present

## 2019-09-21 DIAGNOSIS — E890 Postprocedural hypothyroidism: Secondary | ICD-10-CM | POA: Diagnosis not present

## 2019-09-21 DIAGNOSIS — E1165 Type 2 diabetes mellitus with hyperglycemia: Secondary | ICD-10-CM | POA: Diagnosis not present

## 2019-09-21 DIAGNOSIS — Z7901 Long term (current) use of anticoagulants: Secondary | ICD-10-CM | POA: Diagnosis not present

## 2019-09-21 DIAGNOSIS — I472 Ventricular tachycardia: Secondary | ICD-10-CM | POA: Diagnosis not present

## 2019-09-21 DIAGNOSIS — C3431 Malignant neoplasm of lower lobe, right bronchus or lung: Secondary | ICD-10-CM | POA: Diagnosis not present

## 2019-09-21 DIAGNOSIS — Z8673 Personal history of transient ischemic attack (TIA), and cerebral infarction without residual deficits: Secondary | ICD-10-CM | POA: Diagnosis not present

## 2019-09-21 DIAGNOSIS — Z952 Presence of prosthetic heart valve: Secondary | ICD-10-CM | POA: Diagnosis not present

## 2019-09-21 DIAGNOSIS — J440 Chronic obstructive pulmonary disease with acute lower respiratory infection: Secondary | ICD-10-CM | POA: Diagnosis not present

## 2019-09-21 DIAGNOSIS — Z7902 Long term (current) use of antithrombotics/antiplatelets: Secondary | ICD-10-CM | POA: Diagnosis not present

## 2019-09-21 DIAGNOSIS — I480 Paroxysmal atrial fibrillation: Secondary | ICD-10-CM | POA: Diagnosis not present

## 2019-09-21 DIAGNOSIS — E785 Hyperlipidemia, unspecified: Secondary | ICD-10-CM | POA: Diagnosis not present

## 2019-09-21 DIAGNOSIS — R32 Unspecified urinary incontinence: Secondary | ICD-10-CM | POA: Diagnosis not present

## 2019-09-21 DIAGNOSIS — Z794 Long term (current) use of insulin: Secondary | ICD-10-CM | POA: Diagnosis not present

## 2019-09-21 DIAGNOSIS — I700 Atherosclerosis of aorta: Secondary | ICD-10-CM | POA: Diagnosis not present

## 2019-09-21 DIAGNOSIS — J449 Chronic obstructive pulmonary disease, unspecified: Secondary | ICD-10-CM | POA: Diagnosis not present

## 2019-09-21 DIAGNOSIS — F17210 Nicotine dependence, cigarettes, uncomplicated: Secondary | ICD-10-CM | POA: Diagnosis not present

## 2019-09-21 DIAGNOSIS — G4733 Obstructive sleep apnea (adult) (pediatric): Secondary | ICD-10-CM | POA: Diagnosis not present

## 2019-09-21 DIAGNOSIS — Z9181 History of falling: Secondary | ICD-10-CM | POA: Diagnosis not present

## 2019-09-21 DIAGNOSIS — I5022 Chronic systolic (congestive) heart failure: Secondary | ICD-10-CM | POA: Diagnosis not present

## 2019-09-21 DIAGNOSIS — R002 Palpitations: Secondary | ICD-10-CM | POA: Diagnosis not present

## 2019-09-21 DIAGNOSIS — E1151 Type 2 diabetes mellitus with diabetic peripheral angiopathy without gangrene: Secondary | ICD-10-CM | POA: Diagnosis not present

## 2019-09-21 DIAGNOSIS — M1990 Unspecified osteoarthritis, unspecified site: Secondary | ICD-10-CM | POA: Diagnosis not present

## 2019-09-21 DIAGNOSIS — Z96651 Presence of right artificial knee joint: Secondary | ICD-10-CM | POA: Diagnosis not present

## 2019-09-21 DIAGNOSIS — Z87891 Personal history of nicotine dependence: Secondary | ICD-10-CM | POA: Diagnosis not present

## 2019-09-21 DIAGNOSIS — F329 Major depressive disorder, single episode, unspecified: Secondary | ICD-10-CM | POA: Diagnosis not present

## 2019-09-21 DIAGNOSIS — J4520 Mild intermittent asthma, uncomplicated: Secondary | ICD-10-CM | POA: Diagnosis not present

## 2019-09-21 DIAGNOSIS — I252 Old myocardial infarction: Secondary | ICD-10-CM | POA: Diagnosis not present

## 2019-09-21 DIAGNOSIS — I5032 Chronic diastolic (congestive) heart failure: Secondary | ICD-10-CM | POA: Diagnosis not present

## 2019-09-21 DIAGNOSIS — Z7951 Long term (current) use of inhaled steroids: Secondary | ICD-10-CM | POA: Diagnosis not present

## 2019-09-21 DIAGNOSIS — E1122 Type 2 diabetes mellitus with diabetic chronic kidney disease: Secondary | ICD-10-CM | POA: Diagnosis not present

## 2019-09-21 DIAGNOSIS — I70213 Atherosclerosis of native arteries of extremities with intermittent claudication, bilateral legs: Secondary | ICD-10-CM | POA: Diagnosis not present

## 2019-09-21 DIAGNOSIS — Z483 Aftercare following surgery for neoplasm: Secondary | ICD-10-CM | POA: Diagnosis not present

## 2019-09-21 DIAGNOSIS — Z887 Allergy status to serum and vaccine status: Secondary | ICD-10-CM | POA: Diagnosis not present

## 2019-09-21 DIAGNOSIS — J181 Lobar pneumonia, unspecified organism: Secondary | ICD-10-CM | POA: Diagnosis not present

## 2019-09-21 DIAGNOSIS — J302 Other seasonal allergic rhinitis: Secondary | ICD-10-CM | POA: Diagnosis not present

## 2019-09-21 DIAGNOSIS — M1711 Unilateral primary osteoarthritis, right knee: Secondary | ICD-10-CM | POA: Diagnosis not present

## 2019-09-21 DIAGNOSIS — F331 Major depressive disorder, recurrent, moderate: Secondary | ICD-10-CM | POA: Diagnosis not present

## 2019-09-21 DIAGNOSIS — R7309 Other abnormal glucose: Secondary | ICD-10-CM | POA: Diagnosis not present

## 2019-09-21 DIAGNOSIS — I10 Essential (primary) hypertension: Secondary | ICD-10-CM | POA: Diagnosis not present

## 2019-09-21 DIAGNOSIS — I2510 Atherosclerotic heart disease of native coronary artery without angina pectoris: Secondary | ICD-10-CM | POA: Diagnosis not present

## 2019-09-21 DIAGNOSIS — M1712 Unilateral primary osteoarthritis, left knee: Secondary | ICD-10-CM | POA: Diagnosis not present

## 2019-09-21 DIAGNOSIS — Z9981 Dependence on supplemental oxygen: Secondary | ICD-10-CM | POA: Diagnosis not present

## 2019-09-21 DIAGNOSIS — I130 Hypertensive heart and chronic kidney disease with heart failure and stage 1 through stage 4 chronic kidney disease, or unspecified chronic kidney disease: Secondary | ICD-10-CM | POA: Diagnosis not present

## 2019-09-22 DIAGNOSIS — F0390 Unspecified dementia without behavioral disturbance: Secondary | ICD-10-CM | POA: Diagnosis not present

## 2019-09-22 DIAGNOSIS — C450 Mesothelioma of pleura: Secondary | ICD-10-CM | POA: Diagnosis not present

## 2019-09-22 DIAGNOSIS — Z792 Long term (current) use of antibiotics: Secondary | ICD-10-CM | POA: Diagnosis not present

## 2019-09-22 DIAGNOSIS — J432 Centrilobular emphysema: Secondary | ICD-10-CM | POA: Diagnosis not present

## 2019-09-22 DIAGNOSIS — I10 Essential (primary) hypertension: Secondary | ICD-10-CM | POA: Diagnosis not present

## 2019-09-22 DIAGNOSIS — R569 Unspecified convulsions: Secondary | ICD-10-CM | POA: Diagnosis not present

## 2019-09-22 DIAGNOSIS — J9610 Chronic respiratory failure, unspecified whether with hypoxia or hypercapnia: Secondary | ICD-10-CM | POA: Diagnosis not present

## 2019-09-22 DIAGNOSIS — Z452 Encounter for adjustment and management of vascular access device: Secondary | ICD-10-CM | POA: Diagnosis not present

## 2019-09-22 DIAGNOSIS — Z7982 Long term (current) use of aspirin: Secondary | ICD-10-CM | POA: Diagnosis not present

## 2019-09-22 DIAGNOSIS — G1220 Motor neuron disease, unspecified: Secondary | ICD-10-CM | POA: Diagnosis not present

## 2019-09-22 DIAGNOSIS — E0590 Thyrotoxicosis, unspecified without thyrotoxic crisis or storm: Secondary | ICD-10-CM | POA: Diagnosis not present

## 2019-09-22 DIAGNOSIS — M170 Bilateral primary osteoarthritis of knee: Secondary | ICD-10-CM | POA: Diagnosis not present

## 2019-09-22 DIAGNOSIS — Z85850 Personal history of malignant neoplasm of thyroid: Secondary | ICD-10-CM | POA: Diagnosis not present

## 2019-09-22 DIAGNOSIS — M6281 Muscle weakness (generalized): Secondary | ICD-10-CM | POA: Diagnosis not present

## 2019-09-22 DIAGNOSIS — Z5181 Encounter for therapeutic drug level monitoring: Secondary | ICD-10-CM | POA: Diagnosis not present

## 2019-09-22 DIAGNOSIS — T8452XA Infection and inflammatory reaction due to internal left hip prosthesis, initial encounter: Secondary | ICD-10-CM | POA: Diagnosis not present

## 2019-09-22 DIAGNOSIS — Z87891 Personal history of nicotine dependence: Secondary | ICD-10-CM | POA: Diagnosis not present

## 2019-09-22 DIAGNOSIS — G4733 Obstructive sleep apnea (adult) (pediatric): Secondary | ICD-10-CM | POA: Diagnosis not present

## 2019-09-22 DIAGNOSIS — K5190 Ulcerative colitis, unspecified, without complications: Secondary | ICD-10-CM | POA: Diagnosis not present

## 2019-09-22 DIAGNOSIS — Z9181 History of falling: Secondary | ICD-10-CM | POA: Diagnosis not present

## 2019-09-22 DIAGNOSIS — E039 Hypothyroidism, unspecified: Secondary | ICD-10-CM | POA: Diagnosis not present

## 2019-09-22 DIAGNOSIS — Z8673 Personal history of transient ischemic attack (TIA), and cerebral infarction without residual deficits: Secondary | ICD-10-CM | POA: Diagnosis not present

## 2019-09-22 DIAGNOSIS — I6523 Occlusion and stenosis of bilateral carotid arteries: Secondary | ICD-10-CM | POA: Diagnosis not present

## 2019-09-22 DIAGNOSIS — M25512 Pain in left shoulder: Secondary | ICD-10-CM | POA: Diagnosis not present

## 2019-09-22 DIAGNOSIS — M1990 Unspecified osteoarthritis, unspecified site: Secondary | ICD-10-CM | POA: Diagnosis not present

## 2019-09-22 DIAGNOSIS — M869 Osteomyelitis, unspecified: Secondary | ICD-10-CM | POA: Diagnosis not present

## 2019-09-22 DIAGNOSIS — R2681 Unsteadiness on feet: Secondary | ICD-10-CM | POA: Diagnosis not present

## 2019-09-22 DIAGNOSIS — Z79899 Other long term (current) drug therapy: Secondary | ICD-10-CM | POA: Diagnosis not present

## 2019-09-22 DIAGNOSIS — E119 Type 2 diabetes mellitus without complications: Secondary | ICD-10-CM | POA: Diagnosis not present

## 2019-09-22 DIAGNOSIS — I2510 Atherosclerotic heart disease of native coronary artery without angina pectoris: Secondary | ICD-10-CM | POA: Diagnosis not present

## 2019-09-22 DIAGNOSIS — K5730 Diverticulosis of large intestine without perforation or abscess without bleeding: Secondary | ICD-10-CM | POA: Diagnosis not present

## 2019-09-22 DIAGNOSIS — C4922 Malignant neoplasm of connective and soft tissue of left lower limb, including hip: Secondary | ICD-10-CM | POA: Diagnosis not present

## 2019-09-22 DIAGNOSIS — G4700 Insomnia, unspecified: Secondary | ICD-10-CM | POA: Diagnosis not present

## 2019-09-22 DIAGNOSIS — K219 Gastro-esophageal reflux disease without esophagitis: Secondary | ICD-10-CM | POA: Diagnosis not present

## 2019-09-22 DIAGNOSIS — I252 Old myocardial infarction: Secondary | ICD-10-CM | POA: Diagnosis not present

## 2019-09-22 DIAGNOSIS — G250 Essential tremor: Secondary | ICD-10-CM | POA: Diagnosis not present

## 2019-09-22 DIAGNOSIS — M80031D Age-related osteoporosis with current pathological fracture, right forearm, subsequent encounter for fracture with routine healing: Secondary | ICD-10-CM | POA: Diagnosis not present

## 2019-09-22 DIAGNOSIS — D500 Iron deficiency anemia secondary to blood loss (chronic): Secondary | ICD-10-CM | POA: Diagnosis not present

## 2019-09-23 DIAGNOSIS — U071 COVID-19: Secondary | ICD-10-CM | POA: Diagnosis not present

## 2019-09-23 DIAGNOSIS — F329 Major depressive disorder, single episode, unspecified: Secondary | ICD-10-CM | POA: Diagnosis not present

## 2019-09-23 DIAGNOSIS — R4189 Other symptoms and signs involving cognitive functions and awareness: Secondary | ICD-10-CM | POA: Diagnosis not present

## 2019-09-23 DIAGNOSIS — Z9181 History of falling: Secondary | ICD-10-CM | POA: Diagnosis not present

## 2019-09-23 DIAGNOSIS — R001 Bradycardia, unspecified: Secondary | ICD-10-CM | POA: Diagnosis not present

## 2019-09-23 DIAGNOSIS — I5030 Unspecified diastolic (congestive) heart failure: Secondary | ICD-10-CM | POA: Diagnosis not present

## 2019-09-23 DIAGNOSIS — E876 Hypokalemia: Secondary | ICD-10-CM | POA: Diagnosis not present

## 2019-09-23 DIAGNOSIS — E0590 Thyrotoxicosis, unspecified without thyrotoxic crisis or storm: Secondary | ICD-10-CM | POA: Diagnosis not present

## 2019-09-23 DIAGNOSIS — G250 Essential tremor: Secondary | ICD-10-CM | POA: Diagnosis not present

## 2019-09-23 DIAGNOSIS — G4733 Obstructive sleep apnea (adult) (pediatric): Secondary | ICD-10-CM | POA: Diagnosis not present

## 2019-09-23 DIAGNOSIS — M069 Rheumatoid arthritis, unspecified: Secondary | ICD-10-CM | POA: Diagnosis not present

## 2019-09-23 DIAGNOSIS — N323 Diverticulum of bladder: Secondary | ICD-10-CM | POA: Diagnosis not present

## 2019-09-23 DIAGNOSIS — I2510 Atherosclerotic heart disease of native coronary artery without angina pectoris: Secondary | ICD-10-CM | POA: Diagnosis not present

## 2019-09-23 DIAGNOSIS — Z720 Tobacco use: Secondary | ICD-10-CM | POA: Diagnosis not present

## 2019-09-23 DIAGNOSIS — S72001A Fracture of unspecified part of neck of right femur, initial encounter for closed fracture: Secondary | ICD-10-CM | POA: Diagnosis not present

## 2019-09-23 DIAGNOSIS — N189 Chronic kidney disease, unspecified: Secondary | ICD-10-CM | POA: Diagnosis not present

## 2019-09-23 DIAGNOSIS — G4719 Other hypersomnia: Secondary | ICD-10-CM | POA: Diagnosis not present

## 2019-09-23 DIAGNOSIS — M545 Low back pain: Secondary | ICD-10-CM | POA: Diagnosis not present

## 2019-09-23 DIAGNOSIS — E668 Other obesity: Secondary | ICD-10-CM | POA: Diagnosis not present

## 2019-09-23 DIAGNOSIS — M47812 Spondylosis without myelopathy or radiculopathy, cervical region: Secondary | ICD-10-CM | POA: Diagnosis not present

## 2019-09-23 DIAGNOSIS — I10 Essential (primary) hypertension: Secondary | ICD-10-CM | POA: Diagnosis not present

## 2019-09-23 DIAGNOSIS — Z23 Encounter for immunization: Secondary | ICD-10-CM | POA: Diagnosis not present

## 2019-09-23 DIAGNOSIS — G8929 Other chronic pain: Secondary | ICD-10-CM | POA: Diagnosis not present

## 2019-09-23 DIAGNOSIS — J449 Chronic obstructive pulmonary disease, unspecified: Secondary | ICD-10-CM | POA: Diagnosis not present

## 2019-09-23 DIAGNOSIS — G4734 Idiopathic sleep related nonobstructive alveolar hypoventilation: Secondary | ICD-10-CM | POA: Diagnosis not present

## 2019-09-23 DIAGNOSIS — M4802 Spinal stenosis, cervical region: Secondary | ICD-10-CM | POA: Diagnosis not present

## 2019-09-23 DIAGNOSIS — I69351 Hemiplegia and hemiparesis following cerebral infarction affecting right dominant side: Secondary | ICD-10-CM | POA: Diagnosis not present

## 2019-09-23 DIAGNOSIS — I5032 Chronic diastolic (congestive) heart failure: Secondary | ICD-10-CM | POA: Diagnosis not present

## 2019-09-23 DIAGNOSIS — F418 Other specified anxiety disorders: Secondary | ICD-10-CM | POA: Diagnosis not present

## 2019-09-23 DIAGNOSIS — R195 Other fecal abnormalities: Secondary | ICD-10-CM | POA: Diagnosis not present

## 2019-09-23 DIAGNOSIS — Z87891 Personal history of nicotine dependence: Secondary | ICD-10-CM | POA: Diagnosis not present

## 2019-09-23 DIAGNOSIS — K279 Peptic ulcer, site unspecified, unspecified as acute or chronic, without hemorrhage or perforation: Secondary | ICD-10-CM | POA: Diagnosis not present

## 2019-09-23 DIAGNOSIS — R55 Syncope and collapse: Secondary | ICD-10-CM | POA: Diagnosis not present

## 2019-09-23 DIAGNOSIS — D1803 Hemangioma of intra-abdominal structures: Secondary | ICD-10-CM | POA: Diagnosis not present

## 2019-09-23 DIAGNOSIS — Z853 Personal history of malignant neoplasm of breast: Secondary | ICD-10-CM | POA: Diagnosis not present

## 2019-09-23 DIAGNOSIS — M2550 Pain in unspecified joint: Secondary | ICD-10-CM | POA: Diagnosis not present

## 2019-09-23 DIAGNOSIS — N3000 Acute cystitis without hematuria: Secondary | ICD-10-CM | POA: Diagnosis not present

## 2019-09-23 DIAGNOSIS — I130 Hypertensive heart and chronic kidney disease with heart failure and stage 1 through stage 4 chronic kidney disease, or unspecified chronic kidney disease: Secondary | ICD-10-CM | POA: Diagnosis not present

## 2019-09-23 DIAGNOSIS — R0602 Shortness of breath: Secondary | ICD-10-CM | POA: Diagnosis not present

## 2019-09-23 DIAGNOSIS — C50411 Malignant neoplasm of upper-outer quadrant of right female breast: Secondary | ICD-10-CM | POA: Diagnosis not present

## 2019-09-23 DIAGNOSIS — E782 Mixed hyperlipidemia: Secondary | ICD-10-CM | POA: Diagnosis not present

## 2019-09-23 DIAGNOSIS — Z7401 Bed confinement status: Secondary | ICD-10-CM | POA: Diagnosis not present

## 2019-09-23 DIAGNOSIS — Z9012 Acquired absence of left breast and nipple: Secondary | ICD-10-CM | POA: Diagnosis not present

## 2019-09-23 DIAGNOSIS — E039 Hypothyroidism, unspecified: Secondary | ICD-10-CM | POA: Diagnosis not present

## 2019-09-23 DIAGNOSIS — M5030 Other cervical disc degeneration, unspecified cervical region: Secondary | ICD-10-CM | POA: Diagnosis not present

## 2019-09-23 DIAGNOSIS — S72042A Displaced fracture of base of neck of left femur, initial encounter for closed fracture: Secondary | ICD-10-CM | POA: Diagnosis not present

## 2019-09-23 DIAGNOSIS — Z0000 Encounter for general adult medical examination without abnormal findings: Secondary | ICD-10-CM | POA: Diagnosis not present

## 2019-09-23 DIAGNOSIS — M797 Fibromyalgia: Secondary | ICD-10-CM | POA: Diagnosis not present

## 2019-09-24 DIAGNOSIS — E785 Hyperlipidemia, unspecified: Secondary | ICD-10-CM | POA: Diagnosis not present

## 2019-09-24 DIAGNOSIS — C3431 Malignant neoplasm of lower lobe, right bronchus or lung: Secondary | ICD-10-CM | POA: Diagnosis not present

## 2019-09-24 DIAGNOSIS — Z466 Encounter for fitting and adjustment of urinary device: Secondary | ICD-10-CM | POA: Diagnosis not present

## 2019-09-24 DIAGNOSIS — R2681 Unsteadiness on feet: Secondary | ICD-10-CM | POA: Diagnosis not present

## 2019-09-24 DIAGNOSIS — I10 Essential (primary) hypertension: Secondary | ICD-10-CM | POA: Diagnosis not present

## 2019-09-24 DIAGNOSIS — E1151 Type 2 diabetes mellitus with diabetic peripheral angiopathy without gangrene: Secondary | ICD-10-CM | POA: Diagnosis not present

## 2019-09-24 DIAGNOSIS — M5116 Intervertebral disc disorders with radiculopathy, lumbar region: Secondary | ICD-10-CM | POA: Diagnosis not present

## 2019-09-24 DIAGNOSIS — Z483 Aftercare following surgery for neoplasm: Secondary | ICD-10-CM | POA: Diagnosis not present

## 2019-09-24 DIAGNOSIS — M9712XD Periprosthetic fracture around internal prosthetic left knee joint, subsequent encounter: Secondary | ICD-10-CM | POA: Diagnosis not present

## 2019-09-24 DIAGNOSIS — I083 Combined rheumatic disorders of mitral, aortic and tricuspid valves: Secondary | ICD-10-CM | POA: Diagnosis not present

## 2019-09-24 DIAGNOSIS — I2510 Atherosclerotic heart disease of native coronary artery without angina pectoris: Secondary | ICD-10-CM | POA: Diagnosis not present

## 2019-09-24 DIAGNOSIS — Z20828 Contact with and (suspected) exposure to other viral communicable diseases: Secondary | ICD-10-CM | POA: Diagnosis not present

## 2019-09-24 DIAGNOSIS — K219 Gastro-esophageal reflux disease without esophagitis: Secondary | ICD-10-CM | POA: Diagnosis not present

## 2019-09-24 DIAGNOSIS — F332 Major depressive disorder, recurrent severe without psychotic features: Secondary | ICD-10-CM | POA: Diagnosis not present

## 2019-09-24 DIAGNOSIS — N8111 Cystocele, midline: Secondary | ICD-10-CM | POA: Diagnosis not present

## 2019-09-24 DIAGNOSIS — C159 Malignant neoplasm of esophagus, unspecified: Secondary | ICD-10-CM | POA: Diagnosis not present

## 2019-09-24 DIAGNOSIS — Z03818 Encounter for observation for suspected exposure to other biological agents ruled out: Secondary | ICD-10-CM | POA: Diagnosis not present

## 2019-09-24 DIAGNOSIS — G4733 Obstructive sleep apnea (adult) (pediatric): Secondary | ICD-10-CM | POA: Diagnosis not present

## 2019-09-24 DIAGNOSIS — C3432 Malignant neoplasm of lower lobe, left bronchus or lung: Secondary | ICD-10-CM | POA: Diagnosis not present

## 2019-09-24 DIAGNOSIS — S14109S Unspecified injury at unspecified level of cervical spinal cord, sequela: Secondary | ICD-10-CM | POA: Diagnosis not present

## 2019-09-24 DIAGNOSIS — R3914 Feeling of incomplete bladder emptying: Secondary | ICD-10-CM | POA: Diagnosis not present

## 2019-09-24 DIAGNOSIS — M1990 Unspecified osteoarthritis, unspecified site: Secondary | ICD-10-CM | POA: Diagnosis not present

## 2019-09-24 DIAGNOSIS — I480 Paroxysmal atrial fibrillation: Secondary | ICD-10-CM | POA: Diagnosis not present

## 2019-09-24 DIAGNOSIS — I890 Lymphedema, not elsewhere classified: Secondary | ICD-10-CM | POA: Diagnosis not present

## 2019-09-24 DIAGNOSIS — R339 Retention of urine, unspecified: Secondary | ICD-10-CM | POA: Diagnosis not present

## 2019-09-24 DIAGNOSIS — J449 Chronic obstructive pulmonary disease, unspecified: Secondary | ICD-10-CM | POA: Diagnosis not present

## 2019-09-24 DIAGNOSIS — D649 Anemia, unspecified: Secondary | ICD-10-CM | POA: Diagnosis not present

## 2019-09-24 DIAGNOSIS — F17210 Nicotine dependence, cigarettes, uncomplicated: Secondary | ICD-10-CM | POA: Diagnosis not present

## 2019-09-24 DIAGNOSIS — S72452D Displaced supracondylar fracture without intracondylar extension of lower end of left femur, subsequent encounter for closed fracture with routine healing: Secondary | ICD-10-CM | POA: Diagnosis not present

## 2019-09-24 DIAGNOSIS — K5790 Diverticulosis of intestine, part unspecified, without perforation or abscess without bleeding: Secondary | ICD-10-CM | POA: Diagnosis not present

## 2019-09-24 DIAGNOSIS — Z9181 History of falling: Secondary | ICD-10-CM | POA: Diagnosis not present

## 2019-09-24 DIAGNOSIS — M6281 Muscle weakness (generalized): Secondary | ICD-10-CM | POA: Diagnosis not present

## 2019-09-24 DIAGNOSIS — G8918 Other acute postprocedural pain: Secondary | ICD-10-CM | POA: Diagnosis not present

## 2019-09-24 DIAGNOSIS — M21372 Foot drop, left foot: Secondary | ICD-10-CM | POA: Diagnosis not present

## 2019-09-24 DIAGNOSIS — G8194 Hemiplegia, unspecified affecting left nondominant side: Secondary | ICD-10-CM | POA: Diagnosis not present

## 2019-09-24 DIAGNOSIS — Z125 Encounter for screening for malignant neoplasm of prostate: Secondary | ICD-10-CM | POA: Diagnosis not present

## 2019-09-24 DIAGNOSIS — E1142 Type 2 diabetes mellitus with diabetic polyneuropathy: Secondary | ICD-10-CM | POA: Diagnosis not present

## 2019-09-26 DIAGNOSIS — I10 Essential (primary) hypertension: Secondary | ICD-10-CM | POA: Diagnosis not present

## 2019-09-26 DIAGNOSIS — G459 Transient cerebral ischemic attack, unspecified: Secondary | ICD-10-CM | POA: Diagnosis not present

## 2019-09-26 DIAGNOSIS — M6281 Muscle weakness (generalized): Secondary | ICD-10-CM | POA: Diagnosis not present

## 2019-09-26 DIAGNOSIS — R2689 Other abnormalities of gait and mobility: Secondary | ICD-10-CM | POA: Diagnosis not present

## 2019-09-27 DIAGNOSIS — M810 Age-related osteoporosis without current pathological fracture: Secondary | ICD-10-CM | POA: Diagnosis not present

## 2019-09-27 DIAGNOSIS — E119 Type 2 diabetes mellitus without complications: Secondary | ICD-10-CM | POA: Diagnosis not present

## 2019-09-27 DIAGNOSIS — R001 Bradycardia, unspecified: Secondary | ICD-10-CM | POA: Diagnosis not present

## 2019-09-27 DIAGNOSIS — R531 Weakness: Secondary | ICD-10-CM | POA: Diagnosis not present

## 2019-09-27 DIAGNOSIS — M12811 Other specific arthropathies, not elsewhere classified, right shoulder: Secondary | ICD-10-CM | POA: Diagnosis not present

## 2019-09-27 DIAGNOSIS — R0600 Dyspnea, unspecified: Secondary | ICD-10-CM | POA: Diagnosis not present

## 2019-09-27 DIAGNOSIS — M62838 Other muscle spasm: Secondary | ICD-10-CM | POA: Diagnosis not present

## 2019-09-27 DIAGNOSIS — I350 Nonrheumatic aortic (valve) stenosis: Secondary | ICD-10-CM | POA: Diagnosis not present

## 2019-09-27 DIAGNOSIS — L89312 Pressure ulcer of right buttock, stage 2: Secondary | ICD-10-CM | POA: Diagnosis not present

## 2019-09-27 DIAGNOSIS — I110 Hypertensive heart disease with heart failure: Secondary | ICD-10-CM | POA: Diagnosis not present

## 2019-09-27 DIAGNOSIS — I5032 Chronic diastolic (congestive) heart failure: Secondary | ICD-10-CM | POA: Diagnosis not present

## 2019-09-27 DIAGNOSIS — M109 Gout, unspecified: Secondary | ICD-10-CM | POA: Diagnosis not present

## 2019-09-27 DIAGNOSIS — N390 Urinary tract infection, site not specified: Secondary | ICD-10-CM | POA: Diagnosis not present

## 2019-09-27 DIAGNOSIS — I4821 Permanent atrial fibrillation: Secondary | ICD-10-CM | POA: Diagnosis not present

## 2019-09-27 DIAGNOSIS — J449 Chronic obstructive pulmonary disease, unspecified: Secondary | ICD-10-CM | POA: Diagnosis not present

## 2019-09-27 DIAGNOSIS — G4733 Obstructive sleep apnea (adult) (pediatric): Secondary | ICD-10-CM | POA: Diagnosis not present

## 2019-09-27 DIAGNOSIS — M25561 Pain in right knee: Secondary | ICD-10-CM | POA: Diagnosis not present

## 2019-09-27 DIAGNOSIS — Z96651 Presence of right artificial knee joint: Secondary | ICD-10-CM | POA: Diagnosis not present

## 2019-09-27 DIAGNOSIS — K5902 Outlet dysfunction constipation: Secondary | ICD-10-CM | POA: Diagnosis not present

## 2019-09-27 DIAGNOSIS — F332 Major depressive disorder, recurrent severe without psychotic features: Secondary | ICD-10-CM | POA: Diagnosis not present

## 2019-09-27 DIAGNOSIS — M545 Low back pain: Secondary | ICD-10-CM | POA: Diagnosis not present

## 2019-09-27 DIAGNOSIS — G8929 Other chronic pain: Secondary | ICD-10-CM | POA: Diagnosis not present

## 2019-09-27 DIAGNOSIS — Z4800 Encounter for change or removal of nonsurgical wound dressing: Secondary | ICD-10-CM | POA: Diagnosis not present

## 2019-09-27 DIAGNOSIS — M6289 Other specified disorders of muscle: Secondary | ICD-10-CM | POA: Diagnosis not present

## 2019-09-27 DIAGNOSIS — M1990 Unspecified osteoarthritis, unspecified site: Secondary | ICD-10-CM | POA: Diagnosis not present

## 2019-09-28 DIAGNOSIS — F329 Major depressive disorder, single episode, unspecified: Secondary | ICD-10-CM | POA: Diagnosis not present

## 2019-09-28 DIAGNOSIS — F419 Anxiety disorder, unspecified: Secondary | ICD-10-CM | POA: Diagnosis not present

## 2019-09-28 DIAGNOSIS — M7061 Trochanteric bursitis, right hip: Secondary | ICD-10-CM | POA: Diagnosis not present

## 2019-09-28 DIAGNOSIS — G8384 Todd's paralysis (postepileptic): Secondary | ICD-10-CM | POA: Diagnosis not present

## 2019-09-28 DIAGNOSIS — R2681 Unsteadiness on feet: Secondary | ICD-10-CM | POA: Diagnosis not present

## 2019-09-28 DIAGNOSIS — M4800 Spinal stenosis, site unspecified: Secondary | ICD-10-CM | POA: Diagnosis not present

## 2019-09-28 DIAGNOSIS — M12811 Other specific arthropathies, not elsewhere classified, right shoulder: Secondary | ICD-10-CM | POA: Diagnosis not present

## 2019-09-28 DIAGNOSIS — E663 Overweight: Secondary | ICD-10-CM | POA: Diagnosis not present

## 2019-09-28 DIAGNOSIS — M7582 Other shoulder lesions, left shoulder: Secondary | ICD-10-CM | POA: Diagnosis not present

## 2019-09-28 DIAGNOSIS — I25119 Atherosclerotic heart disease of native coronary artery with unspecified angina pectoris: Secondary | ICD-10-CM | POA: Diagnosis not present

## 2019-09-28 DIAGNOSIS — M5030 Other cervical disc degeneration, unspecified cervical region: Secondary | ICD-10-CM | POA: Diagnosis not present

## 2019-09-28 DIAGNOSIS — I10 Essential (primary) hypertension: Secondary | ICD-10-CM | POA: Diagnosis not present

## 2019-09-28 DIAGNOSIS — D649 Anemia, unspecified: Secondary | ICD-10-CM | POA: Diagnosis not present

## 2019-09-28 DIAGNOSIS — M5412 Radiculopathy, cervical region: Secondary | ICD-10-CM | POA: Diagnosis not present

## 2019-09-28 DIAGNOSIS — M4802 Spinal stenosis, cervical region: Secondary | ICD-10-CM | POA: Diagnosis not present

## 2019-09-28 DIAGNOSIS — Z853 Personal history of malignant neoplasm of breast: Secondary | ICD-10-CM | POA: Diagnosis not present

## 2019-09-28 DIAGNOSIS — E782 Mixed hyperlipidemia: Secondary | ICD-10-CM | POA: Diagnosis not present

## 2019-09-28 DIAGNOSIS — F0280 Dementia in other diseases classified elsewhere without behavioral disturbance: Secondary | ICD-10-CM | POA: Diagnosis not present

## 2019-09-28 DIAGNOSIS — Z9012 Acquired absence of left breast and nipple: Secondary | ICD-10-CM | POA: Diagnosis not present

## 2019-09-28 DIAGNOSIS — G629 Polyneuropathy, unspecified: Secondary | ICD-10-CM | POA: Diagnosis not present

## 2019-09-28 DIAGNOSIS — E039 Hypothyroidism, unspecified: Secondary | ICD-10-CM | POA: Diagnosis not present

## 2019-09-28 DIAGNOSIS — M7542 Impingement syndrome of left shoulder: Secondary | ICD-10-CM | POA: Diagnosis not present

## 2019-09-28 DIAGNOSIS — R7989 Other specified abnormal findings of blood chemistry: Secondary | ICD-10-CM | POA: Diagnosis not present

## 2019-09-28 DIAGNOSIS — Z20828 Contact with and (suspected) exposure to other viral communicable diseases: Secondary | ICD-10-CM | POA: Diagnosis not present

## 2019-09-28 DIAGNOSIS — Z9181 History of falling: Secondary | ICD-10-CM | POA: Diagnosis not present

## 2019-09-28 DIAGNOSIS — I700 Atherosclerosis of aorta: Secondary | ICD-10-CM | POA: Diagnosis not present

## 2019-09-28 DIAGNOSIS — M6281 Muscle weakness (generalized): Secondary | ICD-10-CM | POA: Diagnosis not present

## 2019-09-28 DIAGNOSIS — Z833 Family history of diabetes mellitus: Secondary | ICD-10-CM | POA: Diagnosis not present

## 2019-09-28 DIAGNOSIS — N3000 Acute cystitis without hematuria: Secondary | ICD-10-CM | POA: Diagnosis not present

## 2019-09-28 DIAGNOSIS — G4733 Obstructive sleep apnea (adult) (pediatric): Secondary | ICD-10-CM | POA: Diagnosis not present

## 2019-09-28 DIAGNOSIS — E538 Deficiency of other specified B group vitamins: Secondary | ICD-10-CM | POA: Diagnosis not present

## 2019-09-28 DIAGNOSIS — G301 Alzheimer's disease with late onset: Secondary | ICD-10-CM | POA: Diagnosis not present

## 2019-09-28 DIAGNOSIS — Z7982 Long term (current) use of aspirin: Secondary | ICD-10-CM | POA: Diagnosis not present

## 2019-09-28 DIAGNOSIS — D1803 Hemangioma of intra-abdominal structures: Secondary | ICD-10-CM | POA: Diagnosis not present

## 2019-09-28 DIAGNOSIS — N179 Acute kidney failure, unspecified: Secondary | ICD-10-CM | POA: Diagnosis not present

## 2019-09-28 DIAGNOSIS — E119 Type 2 diabetes mellitus without complications: Secondary | ICD-10-CM | POA: Diagnosis not present

## 2019-09-28 DIAGNOSIS — B820 Intestinal helminthiasis, unspecified: Secondary | ICD-10-CM | POA: Diagnosis not present

## 2019-09-28 DIAGNOSIS — E079 Disorder of thyroid, unspecified: Secondary | ICD-10-CM | POA: Diagnosis not present

## 2019-09-28 DIAGNOSIS — Z79890 Hormone replacement therapy (postmenopausal): Secondary | ICD-10-CM | POA: Diagnosis not present

## 2019-09-28 DIAGNOSIS — R110 Nausea: Secondary | ICD-10-CM | POA: Diagnosis not present

## 2019-09-28 DIAGNOSIS — Z800 Family history of malignant neoplasm of digestive organs: Secondary | ICD-10-CM | POA: Diagnosis not present

## 2019-09-28 DIAGNOSIS — M1990 Unspecified osteoarthritis, unspecified site: Secondary | ICD-10-CM | POA: Diagnosis not present

## 2019-09-28 DIAGNOSIS — G250 Essential tremor: Secondary | ICD-10-CM | POA: Diagnosis not present

## 2019-09-28 DIAGNOSIS — D539 Nutritional anemia, unspecified: Secondary | ICD-10-CM | POA: Diagnosis not present

## 2019-09-28 DIAGNOSIS — Z6829 Body mass index (BMI) 29.0-29.9, adult: Secondary | ICD-10-CM | POA: Diagnosis not present

## 2019-09-28 DIAGNOSIS — R7303 Prediabetes: Secondary | ICD-10-CM | POA: Diagnosis not present

## 2019-09-28 DIAGNOSIS — K279 Peptic ulcer, site unspecified, unspecified as acute or chronic, without hemorrhage or perforation: Secondary | ICD-10-CM | POA: Diagnosis not present

## 2019-09-28 DIAGNOSIS — R918 Other nonspecific abnormal finding of lung field: Secondary | ICD-10-CM | POA: Diagnosis not present

## 2019-09-28 DIAGNOSIS — F332 Major depressive disorder, recurrent severe without psychotic features: Secondary | ICD-10-CM | POA: Diagnosis not present

## 2019-09-28 DIAGNOSIS — E871 Hypo-osmolality and hyponatremia: Secondary | ICD-10-CM | POA: Diagnosis not present

## 2019-09-28 DIAGNOSIS — Z87891 Personal history of nicotine dependence: Secondary | ICD-10-CM | POA: Diagnosis not present

## 2019-09-28 DIAGNOSIS — R457 State of emotional shock and stress, unspecified: Secondary | ICD-10-CM | POA: Diagnosis not present

## 2019-09-28 DIAGNOSIS — J449 Chronic obstructive pulmonary disease, unspecified: Secondary | ICD-10-CM | POA: Diagnosis not present

## 2019-09-28 DIAGNOSIS — I69351 Hemiplegia and hemiparesis following cerebral infarction affecting right dominant side: Secondary | ICD-10-CM | POA: Diagnosis not present

## 2019-09-28 DIAGNOSIS — E785 Hyperlipidemia, unspecified: Secondary | ICD-10-CM | POA: Diagnosis not present

## 2019-09-28 DIAGNOSIS — G43909 Migraine, unspecified, not intractable, without status migrainosus: Secondary | ICD-10-CM | POA: Diagnosis not present

## 2019-09-28 DIAGNOSIS — G40409 Other generalized epilepsy and epileptic syndromes, not intractable, without status epilepticus: Secondary | ICD-10-CM | POA: Diagnosis not present

## 2019-09-28 DIAGNOSIS — Z8249 Family history of ischemic heart disease and other diseases of the circulatory system: Secondary | ICD-10-CM | POA: Diagnosis not present

## 2019-09-28 DIAGNOSIS — Z79899 Other long term (current) drug therapy: Secondary | ICD-10-CM | POA: Diagnosis not present

## 2019-09-29 DIAGNOSIS — Z1231 Encounter for screening mammogram for malignant neoplasm of breast: Secondary | ICD-10-CM | POA: Diagnosis not present

## 2019-09-29 DIAGNOSIS — K635 Polyp of colon: Secondary | ICD-10-CM | POA: Diagnosis not present

## 2019-09-29 DIAGNOSIS — R809 Proteinuria, unspecified: Secondary | ICD-10-CM | POA: Diagnosis not present

## 2019-09-29 DIAGNOSIS — E1129 Type 2 diabetes mellitus with other diabetic kidney complication: Secondary | ICD-10-CM | POA: Diagnosis not present

## 2019-09-29 DIAGNOSIS — E559 Vitamin D deficiency, unspecified: Secondary | ICD-10-CM | POA: Diagnosis not present

## 2019-09-29 DIAGNOSIS — E1122 Type 2 diabetes mellitus with diabetic chronic kidney disease: Secondary | ICD-10-CM | POA: Diagnosis not present

## 2019-09-29 DIAGNOSIS — R7303 Prediabetes: Secondary | ICD-10-CM | POA: Diagnosis not present

## 2019-09-29 DIAGNOSIS — E7800 Pure hypercholesterolemia, unspecified: Secondary | ICD-10-CM | POA: Diagnosis not present

## 2019-09-29 DIAGNOSIS — G43009 Migraine without aura, not intractable, without status migrainosus: Secondary | ICD-10-CM | POA: Diagnosis not present

## 2019-09-29 DIAGNOSIS — E038 Other specified hypothyroidism: Secondary | ICD-10-CM | POA: Diagnosis not present

## 2019-09-29 DIAGNOSIS — E875 Hyperkalemia: Secondary | ICD-10-CM | POA: Diagnosis not present

## 2019-09-29 DIAGNOSIS — D132 Benign neoplasm of duodenum: Secondary | ICD-10-CM | POA: Diagnosis not present

## 2019-09-29 DIAGNOSIS — E785 Hyperlipidemia, unspecified: Secondary | ICD-10-CM | POA: Diagnosis not present

## 2019-09-29 DIAGNOSIS — Z20828 Contact with and (suspected) exposure to other viral communicable diseases: Secondary | ICD-10-CM | POA: Diagnosis not present

## 2019-09-29 DIAGNOSIS — E282 Polycystic ovarian syndrome: Secondary | ICD-10-CM | POA: Diagnosis not present

## 2019-09-29 DIAGNOSIS — D126 Benign neoplasm of colon, unspecified: Secondary | ICD-10-CM | POA: Diagnosis not present

## 2019-09-29 DIAGNOSIS — R1310 Dysphagia, unspecified: Secondary | ICD-10-CM | POA: Diagnosis not present

## 2019-09-29 DIAGNOSIS — K2970 Gastritis, unspecified, without bleeding: Secondary | ICD-10-CM | POA: Diagnosis not present

## 2019-09-29 DIAGNOSIS — R5382 Chronic fatigue, unspecified: Secondary | ICD-10-CM | POA: Diagnosis not present

## 2019-09-29 DIAGNOSIS — Q402 Other specified congenital malformations of stomach: Secondary | ICD-10-CM | POA: Diagnosis not present

## 2019-09-29 DIAGNOSIS — E782 Mixed hyperlipidemia: Secondary | ICD-10-CM | POA: Diagnosis not present

## 2019-09-29 DIAGNOSIS — D649 Anemia, unspecified: Secondary | ICD-10-CM | POA: Diagnosis not present

## 2019-09-29 DIAGNOSIS — I10 Essential (primary) hypertension: Secondary | ICD-10-CM | POA: Diagnosis not present

## 2019-09-29 DIAGNOSIS — D72828 Other elevated white blood cell count: Secondary | ICD-10-CM | POA: Diagnosis not present

## 2019-09-29 DIAGNOSIS — E041 Nontoxic single thyroid nodule: Secondary | ICD-10-CM | POA: Diagnosis not present

## 2019-09-29 DIAGNOSIS — Z79899 Other long term (current) drug therapy: Secondary | ICD-10-CM | POA: Diagnosis not present

## 2019-09-29 DIAGNOSIS — K5140 Inflammatory polyps of colon without complications: Secondary | ICD-10-CM | POA: Diagnosis not present

## 2019-09-29 DIAGNOSIS — Z1159 Encounter for screening for other viral diseases: Secondary | ICD-10-CM | POA: Diagnosis not present

## 2019-09-29 DIAGNOSIS — E039 Hypothyroidism, unspecified: Secondary | ICD-10-CM | POA: Diagnosis not present

## 2019-09-29 DIAGNOSIS — E119 Type 2 diabetes mellitus without complications: Secondary | ICD-10-CM | POA: Diagnosis not present

## 2019-09-29 DIAGNOSIS — L089 Local infection of the skin and subcutaneous tissue, unspecified: Secondary | ICD-10-CM | POA: Diagnosis not present

## 2019-09-29 DIAGNOSIS — R2681 Unsteadiness on feet: Secondary | ICD-10-CM | POA: Diagnosis not present

## 2019-09-29 DIAGNOSIS — M6281 Muscle weakness (generalized): Secondary | ICD-10-CM | POA: Diagnosis not present

## 2019-09-29 DIAGNOSIS — F332 Major depressive disorder, recurrent severe without psychotic features: Secondary | ICD-10-CM | POA: Diagnosis not present

## 2019-09-29 DIAGNOSIS — Z1389 Encounter for screening for other disorder: Secondary | ICD-10-CM | POA: Diagnosis not present

## 2019-09-29 DIAGNOSIS — G4733 Obstructive sleep apnea (adult) (pediatric): Secondary | ICD-10-CM | POA: Diagnosis not present

## 2019-09-29 DIAGNOSIS — K5730 Diverticulosis of large intestine without perforation or abscess without bleeding: Secondary | ICD-10-CM | POA: Diagnosis not present

## 2019-09-29 DIAGNOSIS — Z7689 Persons encountering health services in other specified circumstances: Secondary | ICD-10-CM | POA: Diagnosis not present

## 2019-09-29 DIAGNOSIS — E063 Autoimmune thyroiditis: Secondary | ICD-10-CM | POA: Diagnosis not present

## 2019-09-29 DIAGNOSIS — I129 Hypertensive chronic kidney disease with stage 1 through stage 4 chronic kidney disease, or unspecified chronic kidney disease: Secondary | ICD-10-CM | POA: Diagnosis not present

## 2019-09-29 DIAGNOSIS — G40309 Generalized idiopathic epilepsy and epileptic syndromes, not intractable, without status epilepticus: Secondary | ICD-10-CM | POA: Diagnosis not present

## 2019-09-29 DIAGNOSIS — S0993XA Unspecified injury of face, initial encounter: Secondary | ICD-10-CM | POA: Diagnosis not present

## 2019-09-29 DIAGNOSIS — D638 Anemia in other chronic diseases classified elsewhere: Secondary | ICD-10-CM | POA: Diagnosis not present

## 2019-09-29 DIAGNOSIS — S0083XA Contusion of other part of head, initial encounter: Secondary | ICD-10-CM | POA: Diagnosis not present

## 2019-09-30 DIAGNOSIS — F17210 Nicotine dependence, cigarettes, uncomplicated: Secondary | ICD-10-CM | POA: Diagnosis not present

## 2019-09-30 DIAGNOSIS — N400 Enlarged prostate without lower urinary tract symptoms: Secondary | ICD-10-CM | POA: Diagnosis not present

## 2019-09-30 DIAGNOSIS — Z4789 Encounter for other orthopedic aftercare: Secondary | ICD-10-CM | POA: Diagnosis not present

## 2019-09-30 DIAGNOSIS — I2510 Atherosclerotic heart disease of native coronary artery without angina pectoris: Secondary | ICD-10-CM | POA: Diagnosis not present

## 2019-09-30 DIAGNOSIS — N200 Calculus of kidney: Secondary | ICD-10-CM | POA: Diagnosis not present

## 2019-09-30 DIAGNOSIS — E1165 Type 2 diabetes mellitus with hyperglycemia: Secondary | ICD-10-CM | POA: Diagnosis not present

## 2019-09-30 DIAGNOSIS — N401 Enlarged prostate with lower urinary tract symptoms: Secondary | ICD-10-CM | POA: Diagnosis not present

## 2019-09-30 DIAGNOSIS — T8189XA Other complications of procedures, not elsewhere classified, initial encounter: Secondary | ICD-10-CM | POA: Diagnosis not present

## 2019-09-30 DIAGNOSIS — N390 Urinary tract infection, site not specified: Secondary | ICD-10-CM | POA: Diagnosis not present

## 2019-09-30 DIAGNOSIS — I10 Essential (primary) hypertension: Secondary | ICD-10-CM | POA: Diagnosis not present

## 2019-09-30 DIAGNOSIS — R8290 Unspecified abnormal findings in urine: Secondary | ICD-10-CM | POA: Diagnosis not present

## 2019-09-30 DIAGNOSIS — F332 Major depressive disorder, recurrent severe without psychotic features: Secondary | ICD-10-CM | POA: Diagnosis not present

## 2019-09-30 DIAGNOSIS — R2233 Localized swelling, mass and lump, upper limb, bilateral: Secondary | ICD-10-CM | POA: Diagnosis not present

## 2019-09-30 DIAGNOSIS — Z7689 Persons encountering health services in other specified circumstances: Secondary | ICD-10-CM | POA: Diagnosis not present

## 2019-09-30 DIAGNOSIS — E785 Hyperlipidemia, unspecified: Secondary | ICD-10-CM | POA: Diagnosis not present

## 2019-09-30 DIAGNOSIS — M810 Age-related osteoporosis without current pathological fracture: Secondary | ICD-10-CM | POA: Diagnosis not present

## 2019-09-30 DIAGNOSIS — R351 Nocturia: Secondary | ICD-10-CM | POA: Diagnosis not present

## 2019-09-30 DIAGNOSIS — Z125 Encounter for screening for malignant neoplasm of prostate: Secondary | ICD-10-CM | POA: Diagnosis not present

## 2019-09-30 DIAGNOSIS — G4733 Obstructive sleep apnea (adult) (pediatric): Secondary | ICD-10-CM | POA: Diagnosis not present

## 2019-09-30 DIAGNOSIS — N5201 Erectile dysfunction due to arterial insufficiency: Secondary | ICD-10-CM | POA: Diagnosis not present

## 2019-09-30 DIAGNOSIS — I110 Hypertensive heart disease with heart failure: Secondary | ICD-10-CM | POA: Diagnosis not present

## 2019-09-30 DIAGNOSIS — R7871 Abnormal lead level in blood: Secondary | ICD-10-CM | POA: Diagnosis not present

## 2019-09-30 DIAGNOSIS — J849 Interstitial pulmonary disease, unspecified: Secondary | ICD-10-CM | POA: Diagnosis not present

## 2019-09-30 DIAGNOSIS — I5032 Chronic diastolic (congestive) heart failure: Secondary | ICD-10-CM | POA: Diagnosis not present

## 2019-09-30 DIAGNOSIS — L02429 Furuncle of limb, unspecified: Secondary | ICD-10-CM | POA: Diagnosis not present

## 2019-09-30 DIAGNOSIS — Z7902 Long term (current) use of antithrombotics/antiplatelets: Secondary | ICD-10-CM | POA: Diagnosis not present

## 2019-09-30 DIAGNOSIS — Z8546 Personal history of malignant neoplasm of prostate: Secondary | ICD-10-CM | POA: Diagnosis not present

## 2019-09-30 DIAGNOSIS — S82491S Other fracture of shaft of right fibula, sequela: Secondary | ICD-10-CM | POA: Diagnosis not present

## 2019-09-30 DIAGNOSIS — J449 Chronic obstructive pulmonary disease, unspecified: Secondary | ICD-10-CM | POA: Diagnosis not present

## 2019-09-30 DIAGNOSIS — Z952 Presence of prosthetic heart valve: Secondary | ICD-10-CM | POA: Diagnosis not present

## 2019-09-30 DIAGNOSIS — Z794 Long term (current) use of insulin: Secondary | ICD-10-CM | POA: Diagnosis not present

## 2019-09-30 DIAGNOSIS — Z8673 Personal history of transient ischemic attack (TIA), and cerebral infarction without residual deficits: Secondary | ICD-10-CM | POA: Diagnosis not present

## 2019-09-30 DIAGNOSIS — S82191S Other fracture of upper end of right tibia, sequela: Secondary | ICD-10-CM | POA: Diagnosis not present

## 2019-09-30 DIAGNOSIS — Z87891 Personal history of nicotine dependence: Secondary | ICD-10-CM | POA: Diagnosis not present

## 2019-09-30 DIAGNOSIS — N3000 Acute cystitis without hematuria: Secondary | ICD-10-CM | POA: Diagnosis not present

## 2019-09-30 DIAGNOSIS — M6281 Muscle weakness (generalized): Secondary | ICD-10-CM | POA: Diagnosis not present

## 2019-09-30 DIAGNOSIS — M1990 Unspecified osteoarthritis, unspecified site: Secondary | ICD-10-CM | POA: Diagnosis not present

## 2019-10-01 DIAGNOSIS — F17210 Nicotine dependence, cigarettes, uncomplicated: Secondary | ICD-10-CM | POA: Diagnosis not present

## 2019-10-01 DIAGNOSIS — Z20828 Contact with and (suspected) exposure to other viral communicable diseases: Secondary | ICD-10-CM | POA: Diagnosis not present

## 2019-10-01 DIAGNOSIS — E785 Hyperlipidemia, unspecified: Secondary | ICD-10-CM | POA: Diagnosis not present

## 2019-10-01 DIAGNOSIS — E1129 Type 2 diabetes mellitus with other diabetic kidney complication: Secondary | ICD-10-CM | POA: Diagnosis not present

## 2019-10-01 DIAGNOSIS — F332 Major depressive disorder, recurrent severe without psychotic features: Secondary | ICD-10-CM | POA: Diagnosis not present

## 2019-10-01 DIAGNOSIS — R300 Dysuria: Secondary | ICD-10-CM | POA: Diagnosis not present

## 2019-10-01 DIAGNOSIS — M2550 Pain in unspecified joint: Secondary | ICD-10-CM | POA: Diagnosis not present

## 2019-10-01 DIAGNOSIS — M5137 Other intervertebral disc degeneration, lumbosacral region: Secondary | ICD-10-CM | POA: Diagnosis not present

## 2019-10-01 DIAGNOSIS — R404 Transient alteration of awareness: Secondary | ICD-10-CM | POA: Diagnosis not present

## 2019-10-01 DIAGNOSIS — Z23 Encounter for immunization: Secondary | ICD-10-CM | POA: Diagnosis not present

## 2019-10-01 DIAGNOSIS — F419 Anxiety disorder, unspecified: Secondary | ICD-10-CM | POA: Diagnosis not present

## 2019-10-01 DIAGNOSIS — R456 Violent behavior: Secondary | ICD-10-CM | POA: Diagnosis not present

## 2019-10-01 DIAGNOSIS — I2510 Atherosclerotic heart disease of native coronary artery without angina pectoris: Secondary | ICD-10-CM | POA: Diagnosis not present

## 2019-10-01 DIAGNOSIS — R079 Chest pain, unspecified: Secondary | ICD-10-CM | POA: Diagnosis not present

## 2019-10-01 DIAGNOSIS — R2681 Unsteadiness on feet: Secondary | ICD-10-CM | POA: Diagnosis not present

## 2019-10-01 DIAGNOSIS — G4733 Obstructive sleep apnea (adult) (pediatric): Secondary | ICD-10-CM | POA: Diagnosis not present

## 2019-10-01 DIAGNOSIS — G894 Chronic pain syndrome: Secondary | ICD-10-CM | POA: Diagnosis not present

## 2019-10-01 DIAGNOSIS — J019 Acute sinusitis, unspecified: Secondary | ICD-10-CM | POA: Diagnosis not present

## 2019-10-01 DIAGNOSIS — M5126 Other intervertebral disc displacement, lumbar region: Secondary | ICD-10-CM | POA: Diagnosis not present

## 2019-10-01 DIAGNOSIS — Z6824 Body mass index (BMI) 24.0-24.9, adult: Secondary | ICD-10-CM | POA: Diagnosis not present

## 2019-10-01 DIAGNOSIS — F329 Major depressive disorder, single episode, unspecified: Secondary | ICD-10-CM | POA: Diagnosis not present

## 2019-10-01 DIAGNOSIS — R202 Paresthesia of skin: Secondary | ICD-10-CM | POA: Diagnosis not present

## 2019-10-01 DIAGNOSIS — G893 Neoplasm related pain (acute) (chronic): Secondary | ICD-10-CM | POA: Diagnosis not present

## 2019-10-01 DIAGNOSIS — Z743 Need for continuous supervision: Secondary | ICD-10-CM | POA: Diagnosis not present

## 2019-10-01 DIAGNOSIS — W19XXXA Unspecified fall, initial encounter: Secondary | ICD-10-CM | POA: Diagnosis not present

## 2019-10-01 DIAGNOSIS — R58 Hemorrhage, not elsewhere classified: Secondary | ICD-10-CM | POA: Diagnosis not present

## 2019-10-01 DIAGNOSIS — D649 Anemia, unspecified: Secondary | ICD-10-CM | POA: Diagnosis not present

## 2019-10-01 DIAGNOSIS — I480 Paroxysmal atrial fibrillation: Secondary | ICD-10-CM | POA: Diagnosis not present

## 2019-10-01 DIAGNOSIS — R0981 Nasal congestion: Secondary | ICD-10-CM | POA: Diagnosis not present

## 2019-10-01 DIAGNOSIS — Z9484 Stem cells transplant status: Secondary | ICD-10-CM | POA: Diagnosis not present

## 2019-10-01 DIAGNOSIS — E118 Type 2 diabetes mellitus with unspecified complications: Secondary | ICD-10-CM | POA: Diagnosis not present

## 2019-10-01 DIAGNOSIS — I350 Nonrheumatic aortic (valve) stenosis: Secondary | ICD-10-CM | POA: Diagnosis not present

## 2019-10-01 DIAGNOSIS — Z720 Tobacco use: Secondary | ICD-10-CM | POA: Diagnosis not present

## 2019-10-01 DIAGNOSIS — M797 Fibromyalgia: Secondary | ICD-10-CM | POA: Diagnosis not present

## 2019-10-01 DIAGNOSIS — E039 Hypothyroidism, unspecified: Secondary | ICD-10-CM | POA: Diagnosis not present

## 2019-10-01 DIAGNOSIS — E519 Thiamine deficiency, unspecified: Secondary | ICD-10-CM | POA: Diagnosis not present

## 2019-10-01 DIAGNOSIS — Z794 Long term (current) use of insulin: Secondary | ICD-10-CM | POA: Diagnosis not present

## 2019-10-01 DIAGNOSIS — H6123 Impacted cerumen, bilateral: Secondary | ICD-10-CM | POA: Diagnosis not present

## 2019-10-01 DIAGNOSIS — I499 Cardiac arrhythmia, unspecified: Secondary | ICD-10-CM | POA: Diagnosis not present

## 2019-10-01 DIAGNOSIS — E782 Mixed hyperlipidemia: Secondary | ICD-10-CM | POA: Diagnosis not present

## 2019-10-01 DIAGNOSIS — M542 Cervicalgia: Secondary | ICD-10-CM | POA: Diagnosis not present

## 2019-10-01 DIAGNOSIS — I959 Hypotension, unspecified: Secondary | ICD-10-CM | POA: Diagnosis not present

## 2019-10-01 DIAGNOSIS — H6121 Impacted cerumen, right ear: Secondary | ICD-10-CM | POA: Diagnosis not present

## 2019-10-01 DIAGNOSIS — H6122 Impacted cerumen, left ear: Secondary | ICD-10-CM | POA: Diagnosis not present

## 2019-10-01 DIAGNOSIS — Z09 Encounter for follow-up examination after completed treatment for conditions other than malignant neoplasm: Secondary | ICD-10-CM | POA: Diagnosis not present

## 2019-10-01 DIAGNOSIS — D509 Iron deficiency anemia, unspecified: Secondary | ICD-10-CM | POA: Diagnosis not present

## 2019-10-01 DIAGNOSIS — R1310 Dysphagia, unspecified: Secondary | ICD-10-CM | POA: Diagnosis not present

## 2019-10-01 DIAGNOSIS — M6281 Muscle weakness (generalized): Secondary | ICD-10-CM | POA: Diagnosis not present

## 2019-10-01 DIAGNOSIS — R200 Anesthesia of skin: Secondary | ICD-10-CM | POA: Diagnosis not present

## 2019-10-01 DIAGNOSIS — E559 Vitamin D deficiency, unspecified: Secondary | ICD-10-CM | POA: Diagnosis not present

## 2019-10-01 DIAGNOSIS — F331 Major depressive disorder, recurrent, moderate: Secondary | ICD-10-CM | POA: Diagnosis not present

## 2019-10-01 DIAGNOSIS — E119 Type 2 diabetes mellitus without complications: Secondary | ICD-10-CM | POA: Diagnosis not present

## 2019-10-01 DIAGNOSIS — E291 Testicular hypofunction: Secondary | ICD-10-CM | POA: Diagnosis not present

## 2019-10-01 DIAGNOSIS — R05 Cough: Secondary | ICD-10-CM | POA: Diagnosis not present

## 2019-10-01 DIAGNOSIS — I110 Hypertensive heart disease with heart failure: Secondary | ICD-10-CM | POA: Diagnosis not present

## 2019-10-01 DIAGNOSIS — I5033 Acute on chronic diastolic (congestive) heart failure: Secondary | ICD-10-CM | POA: Diagnosis not present

## 2019-10-01 DIAGNOSIS — E531 Pyridoxine deficiency: Secondary | ICD-10-CM | POA: Diagnosis not present

## 2019-10-01 DIAGNOSIS — M2650 Dentofacial functional abnormalities, unspecified: Secondary | ICD-10-CM | POA: Diagnosis not present

## 2019-10-01 DIAGNOSIS — I6522 Occlusion and stenosis of left carotid artery: Secondary | ICD-10-CM | POA: Diagnosis not present

## 2019-10-01 DIAGNOSIS — S36892A Contusion of other intra-abdominal organs, initial encounter: Secondary | ICD-10-CM | POA: Diagnosis not present

## 2019-10-01 DIAGNOSIS — R5383 Other fatigue: Secondary | ICD-10-CM | POA: Diagnosis not present

## 2019-10-01 DIAGNOSIS — Z0000 Encounter for general adult medical examination without abnormal findings: Secondary | ICD-10-CM | POA: Diagnosis not present

## 2019-10-01 DIAGNOSIS — R279 Unspecified lack of coordination: Secondary | ICD-10-CM | POA: Diagnosis not present

## 2019-10-01 DIAGNOSIS — E038 Other specified hypothyroidism: Secondary | ICD-10-CM | POA: Diagnosis not present

## 2019-10-01 DIAGNOSIS — Z79899 Other long term (current) drug therapy: Secondary | ICD-10-CM | POA: Diagnosis not present

## 2019-10-01 DIAGNOSIS — R531 Weakness: Secondary | ICD-10-CM | POA: Diagnosis not present

## 2019-10-01 DIAGNOSIS — R7301 Impaired fasting glucose: Secondary | ICD-10-CM | POA: Diagnosis not present

## 2019-10-01 DIAGNOSIS — C9002 Multiple myeloma in relapse: Secondary | ICD-10-CM | POA: Diagnosis not present

## 2019-10-02 DIAGNOSIS — M19041 Primary osteoarthritis, right hand: Secondary | ICD-10-CM | POA: Diagnosis not present

## 2019-10-02 DIAGNOSIS — B349 Viral infection, unspecified: Secondary | ICD-10-CM | POA: Diagnosis not present

## 2019-10-02 DIAGNOSIS — N390 Urinary tract infection, site not specified: Secondary | ICD-10-CM | POA: Diagnosis not present

## 2019-10-02 DIAGNOSIS — F0390 Unspecified dementia without behavioral disturbance: Secondary | ICD-10-CM | POA: Diagnosis not present

## 2019-10-02 DIAGNOSIS — Z1159 Encounter for screening for other viral diseases: Secondary | ICD-10-CM | POA: Diagnosis not present

## 2019-10-02 DIAGNOSIS — Z20828 Contact with and (suspected) exposure to other viral communicable diseases: Secondary | ICD-10-CM | POA: Diagnosis not present

## 2019-10-03 DIAGNOSIS — R05 Cough: Secondary | ICD-10-CM | POA: Diagnosis not present

## 2019-10-03 DIAGNOSIS — Z20828 Contact with and (suspected) exposure to other viral communicable diseases: Secondary | ICD-10-CM | POA: Diagnosis not present

## 2019-10-03 DIAGNOSIS — R531 Weakness: Secondary | ICD-10-CM | POA: Diagnosis not present

## 2019-10-03 DIAGNOSIS — M549 Dorsalgia, unspecified: Secondary | ICD-10-CM | POA: Diagnosis not present

## 2019-10-03 DIAGNOSIS — S29012A Strain of muscle and tendon of back wall of thorax, initial encounter: Secondary | ICD-10-CM | POA: Diagnosis not present

## 2019-10-03 DIAGNOSIS — R0689 Other abnormalities of breathing: Secondary | ICD-10-CM | POA: Diagnosis not present

## 2019-10-04 DIAGNOSIS — S32414A Nondisplaced fracture of anterior wall of right acetabulum, initial encounter for closed fracture: Secondary | ICD-10-CM | POA: Diagnosis not present

## 2019-10-04 DIAGNOSIS — E559 Vitamin D deficiency, unspecified: Secondary | ICD-10-CM | POA: Diagnosis not present

## 2019-10-04 DIAGNOSIS — E291 Testicular hypofunction: Secondary | ICD-10-CM | POA: Diagnosis not present

## 2019-10-04 DIAGNOSIS — E7800 Pure hypercholesterolemia, unspecified: Secondary | ICD-10-CM | POA: Diagnosis not present

## 2019-10-04 DIAGNOSIS — R2681 Unsteadiness on feet: Secondary | ICD-10-CM | POA: Diagnosis not present

## 2019-10-04 DIAGNOSIS — M064 Inflammatory polyarthropathy: Secondary | ICD-10-CM | POA: Diagnosis not present

## 2019-10-04 DIAGNOSIS — E1122 Type 2 diabetes mellitus with diabetic chronic kidney disease: Secondary | ICD-10-CM | POA: Diagnosis not present

## 2019-10-04 DIAGNOSIS — R972 Elevated prostate specific antigen [PSA]: Secondary | ICD-10-CM | POA: Diagnosis not present

## 2019-10-04 DIAGNOSIS — R739 Hyperglycemia, unspecified: Secondary | ICD-10-CM | POA: Diagnosis not present

## 2019-10-04 DIAGNOSIS — E7849 Other hyperlipidemia: Secondary | ICD-10-CM | POA: Diagnosis not present

## 2019-10-04 DIAGNOSIS — E892 Postprocedural hypoparathyroidism: Secondary | ICD-10-CM | POA: Diagnosis not present

## 2019-10-04 DIAGNOSIS — F332 Major depressive disorder, recurrent severe without psychotic features: Secondary | ICD-10-CM | POA: Diagnosis not present

## 2019-10-04 DIAGNOSIS — I69041 Monoplegia of lower limb following nontraumatic subarachnoid hemorrhage affecting right dominant side: Secondary | ICD-10-CM | POA: Diagnosis not present

## 2019-10-04 DIAGNOSIS — M6281 Muscle weakness (generalized): Secondary | ICD-10-CM | POA: Diagnosis not present

## 2019-10-04 DIAGNOSIS — Z1159 Encounter for screening for other viral diseases: Secondary | ICD-10-CM | POA: Diagnosis not present

## 2019-10-04 DIAGNOSIS — Z0000 Encounter for general adult medical examination without abnormal findings: Secondary | ICD-10-CM | POA: Diagnosis not present

## 2019-10-04 DIAGNOSIS — Q208 Other congenital malformations of cardiac chambers and connections: Secondary | ICD-10-CM | POA: Diagnosis not present

## 2019-10-04 DIAGNOSIS — C569 Malignant neoplasm of unspecified ovary: Secondary | ICD-10-CM | POA: Diagnosis not present

## 2019-10-04 DIAGNOSIS — R82998 Other abnormal findings in urine: Secondary | ICD-10-CM | POA: Diagnosis not present

## 2019-10-04 DIAGNOSIS — R230 Cyanosis: Secondary | ICD-10-CM | POA: Diagnosis not present

## 2019-10-04 DIAGNOSIS — M79642 Pain in left hand: Secondary | ICD-10-CM | POA: Diagnosis not present

## 2019-10-04 DIAGNOSIS — M545 Low back pain: Secondary | ICD-10-CM | POA: Diagnosis not present

## 2019-10-04 DIAGNOSIS — D473 Essential (hemorrhagic) thrombocythemia: Secondary | ICD-10-CM | POA: Diagnosis not present

## 2019-10-04 DIAGNOSIS — A78 Q fever: Secondary | ICD-10-CM | POA: Diagnosis not present

## 2019-10-04 DIAGNOSIS — C61 Malignant neoplasm of prostate: Secondary | ICD-10-CM | POA: Diagnosis not present

## 2019-10-04 DIAGNOSIS — R1032 Left lower quadrant pain: Secondary | ICD-10-CM | POA: Diagnosis not present

## 2019-10-04 DIAGNOSIS — S42294S Other nondisplaced fracture of upper end of right humerus, sequela: Secondary | ICD-10-CM | POA: Diagnosis not present

## 2019-10-04 DIAGNOSIS — Z8673 Personal history of transient ischemic attack (TIA), and cerebral infarction without residual deficits: Secondary | ICD-10-CM | POA: Diagnosis not present

## 2019-10-04 DIAGNOSIS — N182 Chronic kidney disease, stage 2 (mild): Secondary | ICD-10-CM | POA: Diagnosis not present

## 2019-10-04 DIAGNOSIS — N009 Acute nephritic syndrome with unspecified morphologic changes: Secondary | ICD-10-CM | POA: Diagnosis not present

## 2019-10-04 DIAGNOSIS — R600 Localized edema: Secondary | ICD-10-CM | POA: Diagnosis not present

## 2019-10-04 DIAGNOSIS — M79641 Pain in right hand: Secondary | ICD-10-CM | POA: Diagnosis not present

## 2019-10-04 DIAGNOSIS — M0609 Rheumatoid arthritis without rheumatoid factor, multiple sites: Secondary | ICD-10-CM | POA: Diagnosis not present

## 2019-10-04 DIAGNOSIS — M2550 Pain in unspecified joint: Secondary | ICD-10-CM | POA: Diagnosis not present

## 2019-10-04 DIAGNOSIS — E8881 Metabolic syndrome: Secondary | ICD-10-CM | POA: Diagnosis not present

## 2019-10-04 DIAGNOSIS — E0590 Thyrotoxicosis, unspecified without thyrotoxic crisis or storm: Secondary | ICD-10-CM | POA: Diagnosis not present

## 2019-10-04 DIAGNOSIS — R0602 Shortness of breath: Secondary | ICD-10-CM | POA: Diagnosis not present

## 2019-10-04 DIAGNOSIS — D472 Monoclonal gammopathy: Secondary | ICD-10-CM | POA: Diagnosis not present

## 2019-10-04 DIAGNOSIS — N183 Chronic kidney disease, stage 3 unspecified: Secondary | ICD-10-CM | POA: Diagnosis not present

## 2019-10-04 DIAGNOSIS — J320 Chronic maxillary sinusitis: Secondary | ICD-10-CM | POA: Diagnosis not present

## 2019-10-04 DIAGNOSIS — R519 Headache, unspecified: Secondary | ICD-10-CM | POA: Diagnosis not present

## 2019-10-04 DIAGNOSIS — Q224 Congenital tricuspid stenosis: Secondary | ICD-10-CM | POA: Diagnosis not present

## 2019-10-04 DIAGNOSIS — R5383 Other fatigue: Secondary | ICD-10-CM | POA: Diagnosis not present

## 2019-10-04 DIAGNOSIS — M353 Polymyalgia rheumatica: Secondary | ICD-10-CM | POA: Diagnosis not present

## 2019-10-04 DIAGNOSIS — R001 Bradycardia, unspecified: Secondary | ICD-10-CM | POA: Diagnosis not present

## 2019-10-04 DIAGNOSIS — G4730 Sleep apnea, unspecified: Secondary | ICD-10-CM | POA: Diagnosis not present

## 2019-10-04 DIAGNOSIS — M810 Age-related osteoporosis without current pathological fracture: Secondary | ICD-10-CM | POA: Diagnosis not present

## 2019-10-04 DIAGNOSIS — L89312 Pressure ulcer of right buttock, stage 2: Secondary | ICD-10-CM | POA: Diagnosis not present

## 2019-10-04 DIAGNOSIS — R531 Weakness: Secondary | ICD-10-CM | POA: Diagnosis not present

## 2019-10-04 DIAGNOSIS — I480 Paroxysmal atrial fibrillation: Secondary | ICD-10-CM | POA: Diagnosis not present

## 2019-10-04 DIAGNOSIS — R05 Cough: Secondary | ICD-10-CM | POA: Diagnosis not present

## 2019-10-04 DIAGNOSIS — I4891 Unspecified atrial fibrillation: Secondary | ICD-10-CM | POA: Diagnosis not present

## 2019-10-04 DIAGNOSIS — Z87440 Personal history of urinary (tract) infections: Secondary | ICD-10-CM | POA: Diagnosis not present

## 2019-10-04 DIAGNOSIS — N3001 Acute cystitis with hematuria: Secondary | ICD-10-CM | POA: Diagnosis not present

## 2019-10-04 DIAGNOSIS — G8929 Other chronic pain: Secondary | ICD-10-CM | POA: Diagnosis not present

## 2019-10-04 DIAGNOSIS — K2100 Gastro-esophageal reflux disease with esophagitis, without bleeding: Secondary | ICD-10-CM | POA: Diagnosis not present

## 2019-10-04 DIAGNOSIS — Z9889 Other specified postprocedural states: Secondary | ICD-10-CM | POA: Diagnosis not present

## 2019-10-04 DIAGNOSIS — R945 Abnormal results of liver function studies: Secondary | ICD-10-CM | POA: Diagnosis not present

## 2019-10-04 DIAGNOSIS — I5022 Chronic systolic (congestive) heart failure: Secondary | ICD-10-CM | POA: Diagnosis not present

## 2019-10-04 DIAGNOSIS — R0981 Nasal congestion: Secondary | ICD-10-CM | POA: Diagnosis not present

## 2019-10-04 DIAGNOSIS — Z23 Encounter for immunization: Secondary | ICD-10-CM | POA: Diagnosis not present

## 2019-10-04 DIAGNOSIS — E039 Hypothyroidism, unspecified: Secondary | ICD-10-CM | POA: Diagnosis not present

## 2019-10-04 DIAGNOSIS — R0600 Dyspnea, unspecified: Secondary | ICD-10-CM | POA: Diagnosis not present

## 2019-10-04 DIAGNOSIS — J449 Chronic obstructive pulmonary disease, unspecified: Secondary | ICD-10-CM | POA: Diagnosis not present

## 2019-10-04 DIAGNOSIS — M791 Myalgia, unspecified site: Secondary | ICD-10-CM | POA: Diagnosis not present

## 2019-10-04 DIAGNOSIS — D631 Anemia in chronic kidney disease: Secondary | ICD-10-CM | POA: Diagnosis not present

## 2019-10-04 DIAGNOSIS — I350 Nonrheumatic aortic (valve) stenosis: Secondary | ICD-10-CM | POA: Diagnosis not present

## 2019-10-04 DIAGNOSIS — E782 Mixed hyperlipidemia: Secondary | ICD-10-CM | POA: Diagnosis not present

## 2019-10-04 DIAGNOSIS — N401 Enlarged prostate with lower urinary tract symptoms: Secondary | ICD-10-CM | POA: Diagnosis not present

## 2019-10-04 DIAGNOSIS — R7303 Prediabetes: Secondary | ICD-10-CM | POA: Diagnosis not present

## 2019-10-04 DIAGNOSIS — I10 Essential (primary) hypertension: Secondary | ICD-10-CM | POA: Diagnosis not present

## 2019-10-04 DIAGNOSIS — D649 Anemia, unspecified: Secondary | ICD-10-CM | POA: Diagnosis not present

## 2019-10-04 DIAGNOSIS — J849 Interstitial pulmonary disease, unspecified: Secondary | ICD-10-CM | POA: Diagnosis not present

## 2019-10-04 DIAGNOSIS — I2510 Atherosclerotic heart disease of native coronary artery without angina pectoris: Secondary | ICD-10-CM | POA: Diagnosis not present

## 2019-10-04 DIAGNOSIS — N390 Urinary tract infection, site not specified: Secondary | ICD-10-CM | POA: Diagnosis not present

## 2019-10-04 DIAGNOSIS — Z1211 Encounter for screening for malignant neoplasm of colon: Secondary | ICD-10-CM | POA: Diagnosis not present

## 2019-10-04 DIAGNOSIS — R7989 Other specified abnormal findings of blood chemistry: Secondary | ICD-10-CM | POA: Diagnosis not present

## 2019-10-04 DIAGNOSIS — M25511 Pain in right shoulder: Secondary | ICD-10-CM | POA: Diagnosis not present

## 2019-10-04 DIAGNOSIS — I951 Orthostatic hypotension: Secondary | ICD-10-CM | POA: Diagnosis not present

## 2019-10-04 DIAGNOSIS — I4821 Permanent atrial fibrillation: Secondary | ICD-10-CM | POA: Diagnosis not present

## 2019-10-04 DIAGNOSIS — J069 Acute upper respiratory infection, unspecified: Secondary | ICD-10-CM | POA: Diagnosis not present

## 2019-10-04 DIAGNOSIS — Z79899 Other long term (current) drug therapy: Secondary | ICD-10-CM | POA: Diagnosis not present

## 2019-10-04 DIAGNOSIS — J3489 Other specified disorders of nose and nasal sinuses: Secondary | ICD-10-CM | POA: Diagnosis not present

## 2019-10-04 DIAGNOSIS — E46 Unspecified protein-calorie malnutrition: Secondary | ICD-10-CM | POA: Diagnosis not present

## 2019-10-04 DIAGNOSIS — R319 Hematuria, unspecified: Secondary | ICD-10-CM | POA: Diagnosis not present

## 2019-10-04 DIAGNOSIS — N189 Chronic kidney disease, unspecified: Secondary | ICD-10-CM | POA: Diagnosis not present

## 2019-10-04 DIAGNOSIS — R197 Diarrhea, unspecified: Secondary | ICD-10-CM | POA: Diagnosis not present

## 2019-10-04 DIAGNOSIS — E119 Type 2 diabetes mellitus without complications: Secondary | ICD-10-CM | POA: Diagnosis not present

## 2019-10-04 DIAGNOSIS — I69098 Other sequelae following nontraumatic subarachnoid hemorrhage: Secondary | ICD-10-CM | POA: Diagnosis not present

## 2019-10-04 DIAGNOSIS — E785 Hyperlipidemia, unspecified: Secondary | ICD-10-CM | POA: Diagnosis not present

## 2019-10-04 DIAGNOSIS — D72823 Leukemoid reaction: Secondary | ICD-10-CM | POA: Diagnosis not present

## 2019-10-04 DIAGNOSIS — R7309 Other abnormal glucose: Secondary | ICD-10-CM | POA: Diagnosis not present

## 2019-10-04 DIAGNOSIS — E8809 Other disorders of plasma-protein metabolism, not elsewhere classified: Secondary | ICD-10-CM | POA: Diagnosis not present

## 2019-10-04 DIAGNOSIS — M1990 Unspecified osteoarthritis, unspecified site: Secondary | ICD-10-CM | POA: Diagnosis not present

## 2019-10-04 DIAGNOSIS — Z7901 Long term (current) use of anticoagulants: Secondary | ICD-10-CM | POA: Diagnosis not present

## 2019-10-04 DIAGNOSIS — N289 Disorder of kidney and ureter, unspecified: Secondary | ICD-10-CM | POA: Diagnosis not present

## 2019-10-04 DIAGNOSIS — K811 Chronic cholecystitis: Secondary | ICD-10-CM | POA: Diagnosis not present

## 2019-10-04 DIAGNOSIS — R8290 Unspecified abnormal findings in urine: Secondary | ICD-10-CM | POA: Diagnosis not present

## 2019-10-04 DIAGNOSIS — I5032 Chronic diastolic (congestive) heart failure: Secondary | ICD-10-CM | POA: Diagnosis not present

## 2019-10-04 DIAGNOSIS — D400 Neoplasm of uncertain behavior of prostate: Secondary | ICD-10-CM | POA: Diagnosis not present

## 2019-10-04 DIAGNOSIS — I509 Heart failure, unspecified: Secondary | ICD-10-CM | POA: Diagnosis not present

## 2019-10-04 DIAGNOSIS — Z5181 Encounter for therapeutic drug level monitoring: Secondary | ICD-10-CM | POA: Diagnosis not present

## 2019-10-04 DIAGNOSIS — Z712 Person consulting for explanation of examination or test findings: Secondary | ICD-10-CM | POA: Diagnosis not present

## 2019-10-04 DIAGNOSIS — Z952 Presence of prosthetic heart valve: Secondary | ICD-10-CM | POA: Diagnosis not present

## 2019-10-04 DIAGNOSIS — D62 Acute posthemorrhagic anemia: Secondary | ICD-10-CM | POA: Diagnosis not present

## 2019-10-04 DIAGNOSIS — M109 Gout, unspecified: Secondary | ICD-10-CM | POA: Diagnosis not present

## 2019-10-04 DIAGNOSIS — R6889 Other general symptoms and signs: Secondary | ICD-10-CM | POA: Diagnosis not present

## 2019-10-04 DIAGNOSIS — N83209 Unspecified ovarian cyst, unspecified side: Secondary | ICD-10-CM | POA: Diagnosis not present

## 2019-10-04 DIAGNOSIS — D519 Vitamin B12 deficiency anemia, unspecified: Secondary | ICD-10-CM | POA: Diagnosis not present

## 2019-10-04 DIAGNOSIS — I482 Chronic atrial fibrillation, unspecified: Secondary | ICD-10-CM | POA: Diagnosis not present

## 2019-10-04 DIAGNOSIS — I25810 Atherosclerosis of coronary artery bypass graft(s) without angina pectoris: Secondary | ICD-10-CM | POA: Diagnosis not present

## 2019-10-04 DIAGNOSIS — K769 Liver disease, unspecified: Secondary | ICD-10-CM | POA: Diagnosis not present

## 2019-10-04 DIAGNOSIS — R300 Dysuria: Secondary | ICD-10-CM | POA: Diagnosis not present

## 2019-10-04 DIAGNOSIS — I493 Ventricular premature depolarization: Secondary | ICD-10-CM | POA: Diagnosis not present

## 2019-10-04 DIAGNOSIS — Z4800 Encounter for change or removal of nonsurgical wound dressing: Secondary | ICD-10-CM | POA: Diagnosis not present

## 2019-10-04 DIAGNOSIS — Z20828 Contact with and (suspected) exposure to other viral communicable diseases: Secondary | ICD-10-CM | POA: Diagnosis not present

## 2019-10-04 DIAGNOSIS — E038 Other specified hypothyroidism: Secondary | ICD-10-CM | POA: Diagnosis not present

## 2019-10-04 DIAGNOSIS — K5732 Diverticulitis of large intestine without perforation or abscess without bleeding: Secondary | ICD-10-CM | POA: Diagnosis not present

## 2019-10-04 DIAGNOSIS — Z20818 Contact with and (suspected) exposure to other bacterial communicable diseases: Secondary | ICD-10-CM | POA: Diagnosis not present

## 2019-10-04 DIAGNOSIS — N1831 Chronic kidney disease, stage 3a: Secondary | ICD-10-CM | POA: Diagnosis not present

## 2019-10-04 DIAGNOSIS — I110 Hypertensive heart disease with heart failure: Secondary | ICD-10-CM | POA: Diagnosis not present

## 2019-10-04 DIAGNOSIS — R944 Abnormal results of kidney function studies: Secondary | ICD-10-CM | POA: Diagnosis not present

## 2019-10-04 DIAGNOSIS — I69018 Other symptoms and signs involving cognitive functions following nontraumatic subarachnoid hemorrhage: Secondary | ICD-10-CM | POA: Diagnosis not present

## 2019-10-05 DIAGNOSIS — N898 Other specified noninflammatory disorders of vagina: Secondary | ICD-10-CM | POA: Diagnosis not present

## 2019-10-05 DIAGNOSIS — J301 Allergic rhinitis due to pollen: Secondary | ICD-10-CM | POA: Diagnosis not present

## 2019-10-05 DIAGNOSIS — Z1159 Encounter for screening for other viral diseases: Secondary | ICD-10-CM | POA: Diagnosis not present

## 2019-10-05 DIAGNOSIS — Z79891 Long term (current) use of opiate analgesic: Secondary | ICD-10-CM | POA: Diagnosis not present

## 2019-10-05 DIAGNOSIS — G894 Chronic pain syndrome: Secondary | ICD-10-CM | POA: Diagnosis not present

## 2019-10-05 DIAGNOSIS — R001 Bradycardia, unspecified: Secondary | ICD-10-CM | POA: Diagnosis not present

## 2019-10-05 DIAGNOSIS — R519 Headache, unspecified: Secondary | ICD-10-CM | POA: Diagnosis not present

## 2019-10-05 DIAGNOSIS — D693 Immune thrombocytopenic purpura: Secondary | ICD-10-CM | POA: Diagnosis not present

## 2019-10-05 DIAGNOSIS — M1990 Unspecified osteoarthritis, unspecified site: Secondary | ICD-10-CM | POA: Diagnosis not present

## 2019-10-05 DIAGNOSIS — I5033 Acute on chronic diastolic (congestive) heart failure: Secondary | ICD-10-CM | POA: Diagnosis not present

## 2019-10-05 DIAGNOSIS — K219 Gastro-esophageal reflux disease without esophagitis: Secondary | ICD-10-CM | POA: Diagnosis not present

## 2019-10-05 DIAGNOSIS — T68XXXA Hypothermia, initial encounter: Secondary | ICD-10-CM | POA: Diagnosis not present

## 2019-10-05 DIAGNOSIS — I6529 Occlusion and stenosis of unspecified carotid artery: Secondary | ICD-10-CM | POA: Diagnosis not present

## 2019-10-05 DIAGNOSIS — Z136 Encounter for screening for cardiovascular disorders: Secondary | ICD-10-CM | POA: Diagnosis not present

## 2019-10-05 DIAGNOSIS — R05 Cough: Secondary | ICD-10-CM | POA: Diagnosis not present

## 2019-10-05 DIAGNOSIS — E039 Hypothyroidism, unspecified: Secondary | ICD-10-CM | POA: Diagnosis not present

## 2019-10-05 DIAGNOSIS — I350 Nonrheumatic aortic (valve) stenosis: Secondary | ICD-10-CM | POA: Diagnosis not present

## 2019-10-05 DIAGNOSIS — R1013 Epigastric pain: Secondary | ICD-10-CM | POA: Diagnosis not present

## 2019-10-05 DIAGNOSIS — L03116 Cellulitis of left lower limb: Secondary | ICD-10-CM | POA: Diagnosis not present

## 2019-10-05 DIAGNOSIS — I4891 Unspecified atrial fibrillation: Secondary | ICD-10-CM | POA: Diagnosis not present

## 2019-10-05 DIAGNOSIS — G4733 Obstructive sleep apnea (adult) (pediatric): Secondary | ICD-10-CM | POA: Diagnosis not present

## 2019-10-05 DIAGNOSIS — R7302 Impaired glucose tolerance (oral): Secondary | ICD-10-CM | POA: Diagnosis not present

## 2019-10-05 DIAGNOSIS — N289 Disorder of kidney and ureter, unspecified: Secondary | ICD-10-CM | POA: Diagnosis not present

## 2019-10-05 DIAGNOSIS — H539 Unspecified visual disturbance: Secondary | ICD-10-CM | POA: Diagnosis not present

## 2019-10-05 DIAGNOSIS — Z7401 Bed confinement status: Secondary | ICD-10-CM | POA: Diagnosis not present

## 2019-10-05 DIAGNOSIS — Z113 Encounter for screening for infections with a predominantly sexual mode of transmission: Secondary | ICD-10-CM | POA: Diagnosis not present

## 2019-10-05 DIAGNOSIS — R3915 Urgency of urination: Secondary | ICD-10-CM | POA: Diagnosis not present

## 2019-10-05 DIAGNOSIS — D72829 Elevated white blood cell count, unspecified: Secondary | ICD-10-CM | POA: Diagnosis not present

## 2019-10-05 DIAGNOSIS — R188 Other ascites: Secondary | ICD-10-CM | POA: Diagnosis not present

## 2019-10-05 DIAGNOSIS — J449 Chronic obstructive pulmonary disease, unspecified: Secondary | ICD-10-CM | POA: Diagnosis not present

## 2019-10-05 DIAGNOSIS — M2550 Pain in unspecified joint: Secondary | ICD-10-CM | POA: Diagnosis not present

## 2019-10-05 DIAGNOSIS — Z1329 Encounter for screening for other suspected endocrine disorder: Secondary | ICD-10-CM | POA: Diagnosis not present

## 2019-10-05 DIAGNOSIS — E538 Deficiency of other specified B group vitamins: Secondary | ICD-10-CM | POA: Diagnosis not present

## 2019-10-05 DIAGNOSIS — N3000 Acute cystitis without hematuria: Secondary | ICD-10-CM | POA: Diagnosis not present

## 2019-10-05 DIAGNOSIS — Z741 Need for assistance with personal care: Secondary | ICD-10-CM | POA: Diagnosis not present

## 2019-10-05 DIAGNOSIS — I340 Nonrheumatic mitral (valve) insufficiency: Secondary | ICD-10-CM | POA: Diagnosis not present

## 2019-10-05 DIAGNOSIS — K769 Liver disease, unspecified: Secondary | ICD-10-CM | POA: Diagnosis not present

## 2019-10-05 DIAGNOSIS — R509 Fever, unspecified: Secondary | ICD-10-CM | POA: Diagnosis not present

## 2019-10-05 DIAGNOSIS — E1142 Type 2 diabetes mellitus with diabetic polyneuropathy: Secondary | ICD-10-CM | POA: Diagnosis not present

## 2019-10-05 DIAGNOSIS — R002 Palpitations: Secondary | ICD-10-CM | POA: Diagnosis not present

## 2019-10-05 DIAGNOSIS — K7460 Unspecified cirrhosis of liver: Secondary | ICD-10-CM | POA: Diagnosis not present

## 2019-10-05 DIAGNOSIS — E559 Vitamin D deficiency, unspecified: Secondary | ICD-10-CM | POA: Diagnosis not present

## 2019-10-05 DIAGNOSIS — Z96652 Presence of left artificial knee joint: Secondary | ICD-10-CM | POA: Diagnosis not present

## 2019-10-05 DIAGNOSIS — R8290 Unspecified abnormal findings in urine: Secondary | ICD-10-CM | POA: Diagnosis not present

## 2019-10-05 DIAGNOSIS — R809 Proteinuria, unspecified: Secondary | ICD-10-CM | POA: Diagnosis not present

## 2019-10-05 DIAGNOSIS — D509 Iron deficiency anemia, unspecified: Secondary | ICD-10-CM | POA: Diagnosis not present

## 2019-10-05 DIAGNOSIS — I5020 Unspecified systolic (congestive) heart failure: Secondary | ICD-10-CM | POA: Diagnosis not present

## 2019-10-05 DIAGNOSIS — M19072 Primary osteoarthritis, left ankle and foot: Secondary | ICD-10-CM | POA: Diagnosis not present

## 2019-10-05 DIAGNOSIS — M129 Arthropathy, unspecified: Secondary | ICD-10-CM | POA: Diagnosis not present

## 2019-10-05 DIAGNOSIS — R82998 Other abnormal findings in urine: Secondary | ICD-10-CM | POA: Diagnosis not present

## 2019-10-05 DIAGNOSIS — Z0000 Encounter for general adult medical examination without abnormal findings: Secondary | ICD-10-CM | POA: Diagnosis not present

## 2019-10-05 DIAGNOSIS — C7B8 Other secondary neuroendocrine tumors: Secondary | ICD-10-CM | POA: Diagnosis not present

## 2019-10-05 DIAGNOSIS — M109 Gout, unspecified: Secondary | ICD-10-CM | POA: Diagnosis not present

## 2019-10-05 DIAGNOSIS — Z9989 Dependence on other enabling machines and devices: Secondary | ICD-10-CM | POA: Diagnosis not present

## 2019-10-05 DIAGNOSIS — M7701 Medial epicondylitis, right elbow: Secondary | ICD-10-CM | POA: Diagnosis not present

## 2019-10-05 DIAGNOSIS — R2681 Unsteadiness on feet: Secondary | ICD-10-CM | POA: Diagnosis not present

## 2019-10-05 DIAGNOSIS — R531 Weakness: Secondary | ICD-10-CM | POA: Diagnosis not present

## 2019-10-05 DIAGNOSIS — Z9189 Other specified personal risk factors, not elsewhere classified: Secondary | ICD-10-CM | POA: Diagnosis not present

## 2019-10-05 DIAGNOSIS — F332 Major depressive disorder, recurrent severe without psychotic features: Secondary | ICD-10-CM | POA: Diagnosis not present

## 2019-10-05 DIAGNOSIS — U071 COVID-19: Secondary | ICD-10-CM | POA: Diagnosis not present

## 2019-10-05 DIAGNOSIS — D696 Thrombocytopenia, unspecified: Secondary | ICD-10-CM | POA: Diagnosis not present

## 2019-10-05 DIAGNOSIS — R972 Elevated prostate specific antigen [PSA]: Secondary | ICD-10-CM | POA: Diagnosis not present

## 2019-10-05 DIAGNOSIS — I509 Heart failure, unspecified: Secondary | ICD-10-CM | POA: Diagnosis not present

## 2019-10-05 DIAGNOSIS — D61818 Other pancytopenia: Secondary | ICD-10-CM | POA: Diagnosis not present

## 2019-10-05 DIAGNOSIS — D519 Vitamin B12 deficiency anemia, unspecified: Secondary | ICD-10-CM | POA: Diagnosis not present

## 2019-10-05 DIAGNOSIS — K754 Autoimmune hepatitis: Secondary | ICD-10-CM | POA: Diagnosis not present

## 2019-10-05 DIAGNOSIS — M0579 Rheumatoid arthritis with rheumatoid factor of multiple sites without organ or systems involvement: Secondary | ICD-10-CM | POA: Diagnosis not present

## 2019-10-05 DIAGNOSIS — M5410 Radiculopathy, site unspecified: Secondary | ICD-10-CM | POA: Diagnosis not present

## 2019-10-05 DIAGNOSIS — E663 Overweight: Secondary | ICD-10-CM | POA: Diagnosis not present

## 2019-10-05 DIAGNOSIS — N390 Urinary tract infection, site not specified: Secondary | ICD-10-CM | POA: Diagnosis not present

## 2019-10-05 DIAGNOSIS — R1310 Dysphagia, unspecified: Secondary | ICD-10-CM | POA: Diagnosis not present

## 2019-10-05 DIAGNOSIS — M19041 Primary osteoarthritis, right hand: Secondary | ICD-10-CM | POA: Diagnosis not present

## 2019-10-05 DIAGNOSIS — Z951 Presence of aortocoronary bypass graft: Secondary | ICD-10-CM | POA: Diagnosis not present

## 2019-10-05 DIAGNOSIS — K7030 Alcoholic cirrhosis of liver without ascites: Secondary | ICD-10-CM | POA: Diagnosis not present

## 2019-10-05 DIAGNOSIS — Z5181 Encounter for therapeutic drug level monitoring: Secondary | ICD-10-CM | POA: Diagnosis not present

## 2019-10-05 DIAGNOSIS — Z13220 Encounter for screening for lipoid disorders: Secondary | ICD-10-CM | POA: Diagnosis not present

## 2019-10-05 DIAGNOSIS — M3390 Dermatopolymyositis, unspecified, organ involvement unspecified: Secondary | ICD-10-CM | POA: Diagnosis not present

## 2019-10-05 DIAGNOSIS — E785 Hyperlipidemia, unspecified: Secondary | ICD-10-CM | POA: Diagnosis not present

## 2019-10-05 DIAGNOSIS — R4182 Altered mental status, unspecified: Secondary | ICD-10-CM | POA: Diagnosis not present

## 2019-10-05 DIAGNOSIS — E1143 Type 2 diabetes mellitus with diabetic autonomic (poly)neuropathy: Secondary | ICD-10-CM | POA: Diagnosis not present

## 2019-10-05 DIAGNOSIS — R7301 Impaired fasting glucose: Secondary | ICD-10-CM | POA: Diagnosis not present

## 2019-10-05 DIAGNOSIS — M1380 Other specified arthritis, unspecified site: Secondary | ICD-10-CM | POA: Diagnosis not present

## 2019-10-05 DIAGNOSIS — Z6824 Body mass index (BMI) 24.0-24.9, adult: Secondary | ICD-10-CM | POA: Diagnosis not present

## 2019-10-05 DIAGNOSIS — R600 Localized edema: Secondary | ICD-10-CM | POA: Diagnosis not present

## 2019-10-05 DIAGNOSIS — Z87448 Personal history of other diseases of urinary system: Secondary | ICD-10-CM | POA: Diagnosis not present

## 2019-10-05 DIAGNOSIS — M19071 Primary osteoarthritis, right ankle and foot: Secondary | ICD-10-CM | POA: Diagnosis not present

## 2019-10-05 DIAGNOSIS — B349 Viral infection, unspecified: Secondary | ICD-10-CM | POA: Diagnosis not present

## 2019-10-05 DIAGNOSIS — I70242 Atherosclerosis of native arteries of left leg with ulceration of calf: Secondary | ICD-10-CM | POA: Diagnosis not present

## 2019-10-05 DIAGNOSIS — Z85068 Personal history of other malignant neoplasm of small intestine: Secondary | ICD-10-CM | POA: Diagnosis not present

## 2019-10-05 DIAGNOSIS — J9601 Acute respiratory failure with hypoxia: Secondary | ICD-10-CM | POA: Diagnosis not present

## 2019-10-05 DIAGNOSIS — E038 Other specified hypothyroidism: Secondary | ICD-10-CM | POA: Diagnosis not present

## 2019-10-05 DIAGNOSIS — I2510 Atherosclerotic heart disease of native coronary artery without angina pectoris: Secondary | ICD-10-CM | POA: Diagnosis not present

## 2019-10-05 DIAGNOSIS — Z79899 Other long term (current) drug therapy: Secondary | ICD-10-CM | POA: Diagnosis not present

## 2019-10-05 DIAGNOSIS — R7989 Other specified abnormal findings of blood chemistry: Secondary | ICD-10-CM | POA: Diagnosis not present

## 2019-10-05 DIAGNOSIS — R197 Diarrhea, unspecified: Secondary | ICD-10-CM | POA: Diagnosis not present

## 2019-10-05 DIAGNOSIS — L03115 Cellulitis of right lower limb: Secondary | ICD-10-CM | POA: Diagnosis not present

## 2019-10-05 DIAGNOSIS — M810 Age-related osteoporosis without current pathological fracture: Secondary | ICD-10-CM | POA: Diagnosis not present

## 2019-10-05 DIAGNOSIS — Z1211 Encounter for screening for malignant neoplasm of colon: Secondary | ICD-10-CM | POA: Diagnosis not present

## 2019-10-05 DIAGNOSIS — R911 Solitary pulmonary nodule: Secondary | ICD-10-CM | POA: Diagnosis not present

## 2019-10-05 DIAGNOSIS — R5383 Other fatigue: Secondary | ICD-10-CM | POA: Diagnosis not present

## 2019-10-05 DIAGNOSIS — R300 Dysuria: Secondary | ICD-10-CM | POA: Diagnosis not present

## 2019-10-05 DIAGNOSIS — N183 Chronic kidney disease, stage 3 unspecified: Secondary | ICD-10-CM | POA: Diagnosis not present

## 2019-10-05 DIAGNOSIS — I2699 Other pulmonary embolism without acute cor pulmonale: Secondary | ICD-10-CM | POA: Diagnosis not present

## 2019-10-05 DIAGNOSIS — E1165 Type 2 diabetes mellitus with hyperglycemia: Secondary | ICD-10-CM | POA: Diagnosis not present

## 2019-10-05 DIAGNOSIS — R7401 Elevation of levels of liver transaminase levels: Secondary | ICD-10-CM | POA: Diagnosis not present

## 2019-10-05 DIAGNOSIS — M0609 Rheumatoid arthritis without rheumatoid factor, multiple sites: Secondary | ICD-10-CM | POA: Diagnosis not present

## 2019-10-05 DIAGNOSIS — D649 Anemia, unspecified: Secondary | ICD-10-CM | POA: Diagnosis not present

## 2019-10-05 DIAGNOSIS — E119 Type 2 diabetes mellitus without complications: Secondary | ICD-10-CM | POA: Diagnosis not present

## 2019-10-05 DIAGNOSIS — C439 Malignant melanoma of skin, unspecified: Secondary | ICD-10-CM | POA: Diagnosis not present

## 2019-10-05 DIAGNOSIS — J431 Panlobular emphysema: Secondary | ICD-10-CM | POA: Diagnosis not present

## 2019-10-05 DIAGNOSIS — Z7189 Other specified counseling: Secondary | ICD-10-CM | POA: Diagnosis not present

## 2019-10-05 DIAGNOSIS — Z20828 Contact with and (suspected) exposure to other viral communicable diseases: Secondary | ICD-10-CM | POA: Diagnosis not present

## 2019-10-05 DIAGNOSIS — E782 Mixed hyperlipidemia: Secondary | ICD-10-CM | POA: Diagnosis not present

## 2019-10-05 DIAGNOSIS — Z125 Encounter for screening for malignant neoplasm of prostate: Secondary | ICD-10-CM | POA: Diagnosis not present

## 2019-10-05 DIAGNOSIS — R0602 Shortness of breath: Secondary | ICD-10-CM | POA: Diagnosis not present

## 2019-10-05 DIAGNOSIS — R52 Pain, unspecified: Secondary | ICD-10-CM | POA: Diagnosis not present

## 2019-10-05 DIAGNOSIS — Z6828 Body mass index (BMI) 28.0-28.9, adult: Secondary | ICD-10-CM | POA: Diagnosis not present

## 2019-10-05 DIAGNOSIS — Z23 Encounter for immunization: Secondary | ICD-10-CM | POA: Diagnosis not present

## 2019-10-05 DIAGNOSIS — R739 Hyperglycemia, unspecified: Secondary | ICD-10-CM | POA: Diagnosis not present

## 2019-10-05 DIAGNOSIS — E1169 Type 2 diabetes mellitus with other specified complication: Secondary | ICD-10-CM | POA: Diagnosis not present

## 2019-10-05 DIAGNOSIS — F209 Schizophrenia, unspecified: Secondary | ICD-10-CM | POA: Diagnosis not present

## 2019-10-05 DIAGNOSIS — K7031 Alcoholic cirrhosis of liver with ascites: Secondary | ICD-10-CM | POA: Diagnosis not present

## 2019-10-05 DIAGNOSIS — M6281 Muscle weakness (generalized): Secondary | ICD-10-CM | POA: Diagnosis not present

## 2019-10-05 DIAGNOSIS — M19042 Primary osteoarthritis, left hand: Secondary | ICD-10-CM | POA: Diagnosis not present

## 2019-10-05 DIAGNOSIS — I10 Essential (primary) hypertension: Secondary | ICD-10-CM | POA: Diagnosis not present

## 2019-10-05 DIAGNOSIS — Z9181 History of falling: Secondary | ICD-10-CM | POA: Diagnosis not present

## 2019-10-05 DIAGNOSIS — R350 Frequency of micturition: Secondary | ICD-10-CM | POA: Diagnosis not present

## 2019-10-05 DIAGNOSIS — R7303 Prediabetes: Secondary | ICD-10-CM | POA: Diagnosis not present

## 2019-10-05 DIAGNOSIS — Z8659 Personal history of other mental and behavioral disorders: Secondary | ICD-10-CM | POA: Diagnosis not present

## 2019-10-05 DIAGNOSIS — K589 Irritable bowel syndrome without diarrhea: Secondary | ICD-10-CM | POA: Diagnosis not present

## 2019-10-05 DIAGNOSIS — M1712 Unilateral primary osteoarthritis, left knee: Secondary | ICD-10-CM | POA: Diagnosis not present

## 2019-10-05 DIAGNOSIS — E7800 Pure hypercholesterolemia, unspecified: Secondary | ICD-10-CM | POA: Diagnosis not present

## 2019-10-05 DIAGNOSIS — Z8719 Personal history of other diseases of the digestive system: Secondary | ICD-10-CM | POA: Diagnosis not present

## 2019-10-05 DIAGNOSIS — E669 Obesity, unspecified: Secondary | ICD-10-CM | POA: Diagnosis not present

## 2019-10-05 DIAGNOSIS — M47816 Spondylosis without myelopathy or radiculopathy, lumbar region: Secondary | ICD-10-CM | POA: Diagnosis not present

## 2019-10-05 DIAGNOSIS — Z01810 Encounter for preprocedural cardiovascular examination: Secondary | ICD-10-CM | POA: Diagnosis not present

## 2019-10-05 DIAGNOSIS — F0390 Unspecified dementia without behavioral disturbance: Secondary | ICD-10-CM | POA: Diagnosis not present

## 2019-10-05 DIAGNOSIS — S72142D Displaced intertrochanteric fracture of left femur, subsequent encounter for closed fracture with routine healing: Secondary | ICD-10-CM | POA: Diagnosis not present

## 2019-10-05 DIAGNOSIS — E1122 Type 2 diabetes mellitus with diabetic chronic kidney disease: Secondary | ICD-10-CM | POA: Diagnosis not present

## 2019-10-05 DIAGNOSIS — R293 Abnormal posture: Secondary | ICD-10-CM | POA: Diagnosis not present

## 2019-10-05 DIAGNOSIS — Z8673 Personal history of transient ischemic attack (TIA), and cerebral infarction without residual deficits: Secondary | ICD-10-CM | POA: Diagnosis not present

## 2019-10-05 DIAGNOSIS — G9009 Other idiopathic peripheral autonomic neuropathy: Secondary | ICD-10-CM | POA: Diagnosis not present

## 2019-10-05 DIAGNOSIS — I480 Paroxysmal atrial fibrillation: Secondary | ICD-10-CM | POA: Diagnosis not present

## 2019-10-05 DIAGNOSIS — R0600 Dyspnea, unspecified: Secondary | ICD-10-CM | POA: Diagnosis not present

## 2019-10-05 DIAGNOSIS — M47812 Spondylosis without myelopathy or radiculopathy, cervical region: Secondary | ICD-10-CM | POA: Diagnosis not present

## 2019-10-06 DIAGNOSIS — Z8262 Family history of osteoporosis: Secondary | ICD-10-CM | POA: Diagnosis not present

## 2019-10-06 DIAGNOSIS — M8580 Other specified disorders of bone density and structure, unspecified site: Secondary | ICD-10-CM | POA: Diagnosis not present

## 2019-10-06 DIAGNOSIS — E782 Mixed hyperlipidemia: Secondary | ICD-10-CM | POA: Diagnosis not present

## 2019-10-06 DIAGNOSIS — Z0000 Encounter for general adult medical examination without abnormal findings: Secondary | ICD-10-CM | POA: Diagnosis not present

## 2019-10-06 DIAGNOSIS — F329 Major depressive disorder, single episode, unspecified: Secondary | ICD-10-CM | POA: Diagnosis not present

## 2019-10-06 DIAGNOSIS — M4316 Spondylolisthesis, lumbar region: Secondary | ICD-10-CM | POA: Diagnosis not present

## 2019-10-06 DIAGNOSIS — G629 Polyneuropathy, unspecified: Secondary | ICD-10-CM | POA: Diagnosis not present

## 2019-10-06 DIAGNOSIS — M7542 Impingement syndrome of left shoulder: Secondary | ICD-10-CM | POA: Diagnosis not present

## 2019-10-06 DIAGNOSIS — I429 Cardiomyopathy, unspecified: Secondary | ICD-10-CM | POA: Diagnosis not present

## 2019-10-06 DIAGNOSIS — E519 Thiamine deficiency, unspecified: Secondary | ICD-10-CM | POA: Diagnosis not present

## 2019-10-06 DIAGNOSIS — Z8673 Personal history of transient ischemic attack (TIA), and cerebral infarction without residual deficits: Secondary | ICD-10-CM | POA: Diagnosis not present

## 2019-10-06 DIAGNOSIS — M7582 Other shoulder lesions, left shoulder: Secondary | ICD-10-CM | POA: Diagnosis not present

## 2019-10-06 DIAGNOSIS — Z1159 Encounter for screening for other viral diseases: Secondary | ICD-10-CM | POA: Diagnosis not present

## 2019-10-06 DIAGNOSIS — R0602 Shortness of breath: Secondary | ICD-10-CM | POA: Diagnosis not present

## 2019-10-06 DIAGNOSIS — M3500 Sicca syndrome, unspecified: Secondary | ICD-10-CM | POA: Diagnosis not present

## 2019-10-06 DIAGNOSIS — E7800 Pure hypercholesterolemia, unspecified: Secondary | ICD-10-CM | POA: Diagnosis not present

## 2019-10-06 DIAGNOSIS — F332 Major depressive disorder, recurrent severe without psychotic features: Secondary | ICD-10-CM | POA: Diagnosis not present

## 2019-10-06 DIAGNOSIS — Z7689 Persons encountering health services in other specified circumstances: Secondary | ICD-10-CM | POA: Diagnosis not present

## 2019-10-06 DIAGNOSIS — D72819 Decreased white blood cell count, unspecified: Secondary | ICD-10-CM | POA: Diagnosis not present

## 2019-10-06 DIAGNOSIS — E786 Lipoprotein deficiency: Secondary | ICD-10-CM | POA: Diagnosis not present

## 2019-10-06 DIAGNOSIS — M48061 Spinal stenosis, lumbar region without neurogenic claudication: Secondary | ICD-10-CM | POA: Diagnosis not present

## 2019-10-06 DIAGNOSIS — N1832 Chronic kidney disease, stage 3b: Secondary | ICD-10-CM | POA: Diagnosis not present

## 2019-10-06 DIAGNOSIS — D696 Thrombocytopenia, unspecified: Secondary | ICD-10-CM | POA: Diagnosis not present

## 2019-10-06 DIAGNOSIS — M129 Arthropathy, unspecified: Secondary | ICD-10-CM | POA: Diagnosis not present

## 2019-10-06 DIAGNOSIS — Z6829 Body mass index (BMI) 29.0-29.9, adult: Secondary | ICD-10-CM | POA: Diagnosis not present

## 2019-10-06 DIAGNOSIS — R200 Anesthesia of skin: Secondary | ICD-10-CM | POA: Diagnosis not present

## 2019-10-06 DIAGNOSIS — I420 Dilated cardiomyopathy: Secondary | ICD-10-CM | POA: Diagnosis not present

## 2019-10-06 DIAGNOSIS — Z96652 Presence of left artificial knee joint: Secondary | ICD-10-CM | POA: Diagnosis not present

## 2019-10-06 DIAGNOSIS — I5032 Chronic diastolic (congestive) heart failure: Secondary | ICD-10-CM | POA: Diagnosis not present

## 2019-10-06 DIAGNOSIS — Z794 Long term (current) use of insulin: Secondary | ICD-10-CM | POA: Diagnosis not present

## 2019-10-06 DIAGNOSIS — R7303 Prediabetes: Secondary | ICD-10-CM | POA: Diagnosis not present

## 2019-10-06 DIAGNOSIS — E1142 Type 2 diabetes mellitus with diabetic polyneuropathy: Secondary | ICD-10-CM | POA: Diagnosis not present

## 2019-10-06 DIAGNOSIS — R5383 Other fatigue: Secondary | ICD-10-CM | POA: Diagnosis not present

## 2019-10-06 DIAGNOSIS — Z01818 Encounter for other preprocedural examination: Secondary | ICD-10-CM | POA: Diagnosis not present

## 2019-10-06 DIAGNOSIS — M069 Rheumatoid arthritis, unspecified: Secondary | ICD-10-CM | POA: Diagnosis not present

## 2019-10-06 DIAGNOSIS — N183 Chronic kidney disease, stage 3 unspecified: Secondary | ICD-10-CM | POA: Diagnosis not present

## 2019-10-06 DIAGNOSIS — E038 Other specified hypothyroidism: Secondary | ICD-10-CM | POA: Diagnosis not present

## 2019-10-06 DIAGNOSIS — Z951 Presence of aortocoronary bypass graft: Secondary | ICD-10-CM | POA: Diagnosis not present

## 2019-10-06 DIAGNOSIS — F331 Major depressive disorder, recurrent, moderate: Secondary | ICD-10-CM | POA: Diagnosis not present

## 2019-10-06 DIAGNOSIS — I110 Hypertensive heart disease with heart failure: Secondary | ICD-10-CM | POA: Diagnosis not present

## 2019-10-06 DIAGNOSIS — N179 Acute kidney failure, unspecified: Secondary | ICD-10-CM | POA: Diagnosis not present

## 2019-10-06 DIAGNOSIS — L02429 Furuncle of limb, unspecified: Secondary | ICD-10-CM | POA: Diagnosis not present

## 2019-10-06 DIAGNOSIS — I509 Heart failure, unspecified: Secondary | ICD-10-CM | POA: Diagnosis not present

## 2019-10-06 DIAGNOSIS — N200 Calculus of kidney: Secondary | ICD-10-CM | POA: Diagnosis not present

## 2019-10-06 DIAGNOSIS — M532X6 Spinal instabilities, lumbar region: Secondary | ICD-10-CM | POA: Diagnosis not present

## 2019-10-06 DIAGNOSIS — M7061 Trochanteric bursitis, right hip: Secondary | ICD-10-CM | POA: Diagnosis not present

## 2019-10-06 DIAGNOSIS — S12500D Unspecified displaced fracture of sixth cervical vertebra, subsequent encounter for fracture with routine healing: Secondary | ICD-10-CM | POA: Diagnosis not present

## 2019-10-06 DIAGNOSIS — D500 Iron deficiency anemia secondary to blood loss (chronic): Secondary | ICD-10-CM | POA: Diagnosis not present

## 2019-10-06 DIAGNOSIS — R748 Abnormal levels of other serum enzymes: Secondary | ICD-10-CM | POA: Diagnosis not present

## 2019-10-06 DIAGNOSIS — Z03818 Encounter for observation for suspected exposure to other biological agents ruled out: Secondary | ICD-10-CM | POA: Diagnosis not present

## 2019-10-06 DIAGNOSIS — E531 Pyridoxine deficiency: Secondary | ICD-10-CM | POA: Diagnosis not present

## 2019-10-06 DIAGNOSIS — M816 Localized osteoporosis [Lequesne]: Secondary | ICD-10-CM | POA: Diagnosis not present

## 2019-10-06 DIAGNOSIS — R2233 Localized swelling, mass and lump, upper limb, bilateral: Secondary | ICD-10-CM | POA: Diagnosis not present

## 2019-10-06 DIAGNOSIS — E039 Hypothyroidism, unspecified: Secondary | ICD-10-CM | POA: Diagnosis not present

## 2019-10-06 DIAGNOSIS — Z8679 Personal history of other diseases of the circulatory system: Secondary | ICD-10-CM | POA: Diagnosis not present

## 2019-10-06 DIAGNOSIS — D649 Anemia, unspecified: Secondary | ICD-10-CM | POA: Diagnosis not present

## 2019-10-06 DIAGNOSIS — N3001 Acute cystitis with hematuria: Secondary | ICD-10-CM | POA: Diagnosis not present

## 2019-10-06 DIAGNOSIS — E663 Overweight: Secondary | ICD-10-CM | POA: Diagnosis not present

## 2019-10-06 DIAGNOSIS — E1165 Type 2 diabetes mellitus with hyperglycemia: Secondary | ICD-10-CM | POA: Diagnosis not present

## 2019-10-06 DIAGNOSIS — D509 Iron deficiency anemia, unspecified: Secondary | ICD-10-CM | POA: Diagnosis not present

## 2019-10-06 DIAGNOSIS — M353 Polymyalgia rheumatica: Secondary | ICD-10-CM | POA: Diagnosis not present

## 2019-10-06 DIAGNOSIS — R202 Paresthesia of skin: Secondary | ICD-10-CM | POA: Diagnosis not present

## 2019-10-06 DIAGNOSIS — Z114 Encounter for screening for human immunodeficiency virus [HIV]: Secondary | ICD-10-CM | POA: Diagnosis not present

## 2019-10-06 DIAGNOSIS — L0101 Non-bullous impetigo: Secondary | ICD-10-CM | POA: Diagnosis not present

## 2019-10-06 DIAGNOSIS — S2232XD Fracture of one rib, left side, subsequent encounter for fracture with routine healing: Secondary | ICD-10-CM | POA: Diagnosis not present

## 2019-10-06 DIAGNOSIS — R399 Unspecified symptoms and signs involving the genitourinary system: Secondary | ICD-10-CM | POA: Diagnosis not present

## 2019-10-06 DIAGNOSIS — I25119 Atherosclerotic heart disease of native coronary artery with unspecified angina pectoris: Secondary | ICD-10-CM | POA: Diagnosis not present

## 2019-10-06 DIAGNOSIS — E1129 Type 2 diabetes mellitus with other diabetic kidney complication: Secondary | ICD-10-CM | POA: Diagnosis not present

## 2019-10-06 DIAGNOSIS — E785 Hyperlipidemia, unspecified: Secondary | ICD-10-CM | POA: Diagnosis not present

## 2019-10-06 DIAGNOSIS — S15102D Unspecified injury of left vertebral artery, subsequent encounter: Secondary | ICD-10-CM | POA: Diagnosis not present

## 2019-10-06 DIAGNOSIS — M1990 Unspecified osteoarthritis, unspecified site: Secondary | ICD-10-CM | POA: Diagnosis not present

## 2019-10-06 DIAGNOSIS — Z20828 Contact with and (suspected) exposure to other viral communicable diseases: Secondary | ICD-10-CM | POA: Diagnosis not present

## 2019-10-06 DIAGNOSIS — G2581 Restless legs syndrome: Secondary | ICD-10-CM | POA: Diagnosis not present

## 2019-10-06 DIAGNOSIS — J029 Acute pharyngitis, unspecified: Secondary | ICD-10-CM | POA: Diagnosis not present

## 2019-10-06 DIAGNOSIS — J439 Emphysema, unspecified: Secondary | ICD-10-CM | POA: Diagnosis not present

## 2019-10-06 DIAGNOSIS — F419 Anxiety disorder, unspecified: Secondary | ICD-10-CM | POA: Diagnosis not present

## 2019-10-06 DIAGNOSIS — E079 Disorder of thyroid, unspecified: Secondary | ICD-10-CM | POA: Diagnosis not present

## 2019-10-06 DIAGNOSIS — Z7952 Long term (current) use of systemic steroids: Secondary | ICD-10-CM | POA: Diagnosis not present

## 2019-10-06 DIAGNOSIS — R7301 Impaired fasting glucose: Secondary | ICD-10-CM | POA: Diagnosis not present

## 2019-10-06 DIAGNOSIS — Z79899 Other long term (current) drug therapy: Secondary | ICD-10-CM | POA: Diagnosis not present

## 2019-10-06 DIAGNOSIS — E119 Type 2 diabetes mellitus without complications: Secondary | ICD-10-CM | POA: Diagnosis not present

## 2019-10-06 DIAGNOSIS — I10 Essential (primary) hypertension: Secondary | ICD-10-CM | POA: Diagnosis not present

## 2019-10-06 DIAGNOSIS — N390 Urinary tract infection, site not specified: Secondary | ICD-10-CM | POA: Diagnosis not present

## 2019-10-06 DIAGNOSIS — M810 Age-related osteoporosis without current pathological fracture: Secondary | ICD-10-CM | POA: Diagnosis not present

## 2019-10-06 DIAGNOSIS — I270 Primary pulmonary hypertension: Secondary | ICD-10-CM | POA: Diagnosis not present

## 2019-10-06 DIAGNOSIS — E1169 Type 2 diabetes mellitus with other specified complication: Secondary | ICD-10-CM | POA: Diagnosis not present

## 2019-10-06 DIAGNOSIS — R188 Other ascites: Secondary | ICD-10-CM | POA: Diagnosis not present

## 2019-10-06 DIAGNOSIS — M4854XD Collapsed vertebra, not elsewhere classified, thoracic region, subsequent encounter for fracture with routine healing: Secondary | ICD-10-CM | POA: Diagnosis not present

## 2019-10-06 DIAGNOSIS — Z952 Presence of prosthetic heart valve: Secondary | ICD-10-CM | POA: Diagnosis not present

## 2019-10-06 DIAGNOSIS — N400 Enlarged prostate without lower urinary tract symptoms: Secondary | ICD-10-CM | POA: Diagnosis not present

## 2019-10-06 DIAGNOSIS — M4800 Spinal stenosis, site unspecified: Secondary | ICD-10-CM | POA: Diagnosis not present

## 2019-10-06 DIAGNOSIS — M545 Low back pain: Secondary | ICD-10-CM | POA: Diagnosis not present

## 2019-10-06 DIAGNOSIS — S12600D Unspecified displaced fracture of seventh cervical vertebra, subsequent encounter for fracture with routine healing: Secondary | ICD-10-CM | POA: Diagnosis not present

## 2019-10-06 DIAGNOSIS — C3491 Malignant neoplasm of unspecified part of right bronchus or lung: Secondary | ICD-10-CM | POA: Diagnosis not present

## 2019-10-06 DIAGNOSIS — R252 Cramp and spasm: Secondary | ICD-10-CM | POA: Diagnosis not present

## 2019-10-06 DIAGNOSIS — R29890 Loss of height: Secondary | ICD-10-CM | POA: Diagnosis not present

## 2019-10-06 DIAGNOSIS — I350 Nonrheumatic aortic (valve) stenosis: Secondary | ICD-10-CM | POA: Diagnosis not present

## 2019-10-06 DIAGNOSIS — R82998 Other abnormal findings in urine: Secondary | ICD-10-CM | POA: Diagnosis not present

## 2019-10-06 DIAGNOSIS — L658 Other specified nonscarring hair loss: Secondary | ICD-10-CM | POA: Diagnosis not present

## 2019-10-06 DIAGNOSIS — J441 Chronic obstructive pulmonary disease with (acute) exacerbation: Secondary | ICD-10-CM | POA: Diagnosis not present

## 2019-10-06 DIAGNOSIS — R8290 Unspecified abnormal findings in urine: Secondary | ICD-10-CM | POA: Diagnosis not present

## 2019-10-06 DIAGNOSIS — Z7982 Long term (current) use of aspirin: Secondary | ICD-10-CM | POA: Diagnosis not present

## 2019-10-06 DIAGNOSIS — E875 Hyperkalemia: Secondary | ICD-10-CM | POA: Diagnosis not present

## 2019-10-06 DIAGNOSIS — E559 Vitamin D deficiency, unspecified: Secondary | ICD-10-CM | POA: Diagnosis not present

## 2019-10-06 DIAGNOSIS — R799 Abnormal finding of blood chemistry, unspecified: Secondary | ICD-10-CM | POA: Diagnosis not present

## 2019-10-06 DIAGNOSIS — R946 Abnormal results of thyroid function studies: Secondary | ICD-10-CM | POA: Diagnosis not present

## 2019-10-06 DIAGNOSIS — U071 COVID-19: Secondary | ICD-10-CM | POA: Diagnosis not present

## 2019-10-06 DIAGNOSIS — Z981 Arthrodesis status: Secondary | ICD-10-CM | POA: Diagnosis not present

## 2019-10-07 DIAGNOSIS — F419 Anxiety disorder, unspecified: Secondary | ICD-10-CM | POA: Diagnosis not present

## 2019-10-07 DIAGNOSIS — R0782 Intercostal pain: Secondary | ICD-10-CM | POA: Diagnosis not present

## 2019-10-07 DIAGNOSIS — I723 Aneurysm of iliac artery: Secondary | ICD-10-CM | POA: Diagnosis not present

## 2019-10-07 DIAGNOSIS — K5730 Diverticulosis of large intestine without perforation or abscess without bleeding: Secondary | ICD-10-CM | POA: Diagnosis not present

## 2019-10-07 DIAGNOSIS — Z885 Allergy status to narcotic agent status: Secondary | ICD-10-CM | POA: Diagnosis not present

## 2019-10-07 DIAGNOSIS — I2510 Atherosclerotic heart disease of native coronary artery without angina pectoris: Secondary | ICD-10-CM | POA: Diagnosis not present

## 2019-10-07 DIAGNOSIS — R0989 Other specified symptoms and signs involving the circulatory and respiratory systems: Secondary | ICD-10-CM | POA: Diagnosis not present

## 2019-10-07 DIAGNOSIS — G4700 Insomnia, unspecified: Secondary | ICD-10-CM | POA: Diagnosis not present

## 2019-10-07 DIAGNOSIS — F411 Generalized anxiety disorder: Secondary | ICD-10-CM | POA: Diagnosis not present

## 2019-10-07 DIAGNOSIS — Z13220 Encounter for screening for lipoid disorders: Secondary | ICD-10-CM | POA: Diagnosis not present

## 2019-10-07 DIAGNOSIS — E7800 Pure hypercholesterolemia, unspecified: Secondary | ICD-10-CM | POA: Diagnosis not present

## 2019-10-07 DIAGNOSIS — M310 Hypersensitivity angiitis: Secondary | ICD-10-CM | POA: Diagnosis not present

## 2019-10-07 DIAGNOSIS — Z03818 Encounter for observation for suspected exposure to other biological agents ruled out: Secondary | ICD-10-CM | POA: Diagnosis not present

## 2019-10-07 DIAGNOSIS — M545 Low back pain: Secondary | ICD-10-CM | POA: Diagnosis not present

## 2019-10-07 DIAGNOSIS — R82998 Other abnormal findings in urine: Secondary | ICD-10-CM | POA: Diagnosis not present

## 2019-10-07 DIAGNOSIS — E1140 Type 2 diabetes mellitus with diabetic neuropathy, unspecified: Secondary | ICD-10-CM | POA: Diagnosis not present

## 2019-10-07 DIAGNOSIS — R197 Diarrhea, unspecified: Secondary | ICD-10-CM | POA: Diagnosis not present

## 2019-10-07 DIAGNOSIS — E038 Other specified hypothyroidism: Secondary | ICD-10-CM | POA: Diagnosis not present

## 2019-10-07 DIAGNOSIS — M8589 Other specified disorders of bone density and structure, multiple sites: Secondary | ICD-10-CM | POA: Diagnosis not present

## 2019-10-07 DIAGNOSIS — B029 Zoster without complications: Secondary | ICD-10-CM | POA: Diagnosis not present

## 2019-10-07 DIAGNOSIS — E1142 Type 2 diabetes mellitus with diabetic polyneuropathy: Secondary | ICD-10-CM | POA: Diagnosis not present

## 2019-10-07 DIAGNOSIS — R0600 Dyspnea, unspecified: Secondary | ICD-10-CM | POA: Diagnosis not present

## 2019-10-07 DIAGNOSIS — K862 Cyst of pancreas: Secondary | ICD-10-CM | POA: Diagnosis not present

## 2019-10-07 DIAGNOSIS — E1169 Type 2 diabetes mellitus with other specified complication: Secondary | ICD-10-CM | POA: Diagnosis not present

## 2019-10-07 DIAGNOSIS — M5416 Radiculopathy, lumbar region: Secondary | ICD-10-CM | POA: Diagnosis not present

## 2019-10-07 DIAGNOSIS — G40009 Localization-related (focal) (partial) idiopathic epilepsy and epileptic syndromes with seizures of localized onset, not intractable, without status epilepticus: Secondary | ICD-10-CM | POA: Diagnosis not present

## 2019-10-07 DIAGNOSIS — E1129 Type 2 diabetes mellitus with other diabetic kidney complication: Secondary | ICD-10-CM | POA: Diagnosis not present

## 2019-10-07 DIAGNOSIS — M25552 Pain in left hip: Secondary | ICD-10-CM | POA: Diagnosis not present

## 2019-10-07 DIAGNOSIS — Z880 Allergy status to penicillin: Secondary | ICD-10-CM | POA: Diagnosis not present

## 2019-10-07 DIAGNOSIS — M797 Fibromyalgia: Secondary | ICD-10-CM | POA: Diagnosis not present

## 2019-10-07 DIAGNOSIS — K30 Functional dyspepsia: Secondary | ICD-10-CM | POA: Diagnosis not present

## 2019-10-07 DIAGNOSIS — H1133 Conjunctival hemorrhage, bilateral: Secondary | ICD-10-CM | POA: Diagnosis not present

## 2019-10-07 DIAGNOSIS — M1990 Unspecified osteoarthritis, unspecified site: Secondary | ICD-10-CM | POA: Diagnosis not present

## 2019-10-07 DIAGNOSIS — I482 Chronic atrial fibrillation, unspecified: Secondary | ICD-10-CM | POA: Diagnosis not present

## 2019-10-07 DIAGNOSIS — M79672 Pain in left foot: Secondary | ICD-10-CM | POA: Diagnosis not present

## 2019-10-07 DIAGNOSIS — E559 Vitamin D deficiency, unspecified: Secondary | ICD-10-CM | POA: Diagnosis not present

## 2019-10-07 DIAGNOSIS — Z20828 Contact with and (suspected) exposure to other viral communicable diseases: Secondary | ICD-10-CM | POA: Diagnosis not present

## 2019-10-07 DIAGNOSIS — E291 Testicular hypofunction: Secondary | ICD-10-CM | POA: Diagnosis not present

## 2019-10-07 DIAGNOSIS — K219 Gastro-esophageal reflux disease without esophagitis: Secondary | ICD-10-CM | POA: Diagnosis not present

## 2019-10-07 DIAGNOSIS — Z125 Encounter for screening for malignant neoplasm of prostate: Secondary | ICD-10-CM | POA: Diagnosis not present

## 2019-10-07 DIAGNOSIS — E119 Type 2 diabetes mellitus without complications: Secondary | ICD-10-CM | POA: Diagnosis not present

## 2019-10-07 DIAGNOSIS — Z7901 Long term (current) use of anticoagulants: Secondary | ICD-10-CM | POA: Diagnosis not present

## 2019-10-07 DIAGNOSIS — B182 Chronic viral hepatitis C: Secondary | ICD-10-CM | POA: Diagnosis not present

## 2019-10-07 DIAGNOSIS — G44319 Acute post-traumatic headache, not intractable: Secondary | ICD-10-CM | POA: Diagnosis not present

## 2019-10-07 DIAGNOSIS — Z8249 Family history of ischemic heart disease and other diseases of the circulatory system: Secondary | ICD-10-CM | POA: Diagnosis not present

## 2019-10-07 DIAGNOSIS — J441 Chronic obstructive pulmonary disease with (acute) exacerbation: Secondary | ICD-10-CM | POA: Diagnosis not present

## 2019-10-07 DIAGNOSIS — M25562 Pain in left knee: Secondary | ICD-10-CM | POA: Diagnosis not present

## 2019-10-07 DIAGNOSIS — N184 Chronic kidney disease, stage 4 (severe): Secondary | ICD-10-CM | POA: Diagnosis not present

## 2019-10-07 DIAGNOSIS — Z48815 Encounter for surgical aftercare following surgery on the digestive system: Secondary | ICD-10-CM | POA: Diagnosis not present

## 2019-10-07 DIAGNOSIS — R6883 Chills (without fever): Secondary | ICD-10-CM | POA: Diagnosis not present

## 2019-10-07 DIAGNOSIS — R05 Cough: Secondary | ICD-10-CM | POA: Diagnosis not present

## 2019-10-07 DIAGNOSIS — R7301 Impaired fasting glucose: Secondary | ICD-10-CM | POA: Diagnosis not present

## 2019-10-07 DIAGNOSIS — M5116 Intervertebral disc disorders with radiculopathy, lumbar region: Secondary | ICD-10-CM | POA: Diagnosis not present

## 2019-10-07 DIAGNOSIS — N261 Atrophy of kidney (terminal): Secondary | ICD-10-CM | POA: Diagnosis not present

## 2019-10-07 DIAGNOSIS — D510 Vitamin B12 deficiency anemia due to intrinsic factor deficiency: Secondary | ICD-10-CM | POA: Diagnosis not present

## 2019-10-07 DIAGNOSIS — R519 Headache, unspecified: Secondary | ICD-10-CM | POA: Diagnosis not present

## 2019-10-07 DIAGNOSIS — Z8719 Personal history of other diseases of the digestive system: Secondary | ICD-10-CM | POA: Diagnosis not present

## 2019-10-07 DIAGNOSIS — Z789 Other specified health status: Secondary | ICD-10-CM | POA: Diagnosis not present

## 2019-10-07 DIAGNOSIS — M129 Arthropathy, unspecified: Secondary | ICD-10-CM | POA: Diagnosis not present

## 2019-10-07 DIAGNOSIS — K5651 Intestinal adhesions [bands], with partial obstruction: Secondary | ICD-10-CM | POA: Diagnosis not present

## 2019-10-07 DIAGNOSIS — G3184 Mild cognitive impairment, so stated: Secondary | ICD-10-CM | POA: Diagnosis not present

## 2019-10-07 DIAGNOSIS — E785 Hyperlipidemia, unspecified: Secondary | ICD-10-CM | POA: Diagnosis not present

## 2019-10-07 DIAGNOSIS — E079 Disorder of thyroid, unspecified: Secondary | ICD-10-CM | POA: Diagnosis not present

## 2019-10-07 DIAGNOSIS — Z6822 Body mass index (BMI) 22.0-22.9, adult: Secondary | ICD-10-CM | POA: Diagnosis not present

## 2019-10-07 DIAGNOSIS — M25551 Pain in right hip: Secondary | ICD-10-CM | POA: Diagnosis not present

## 2019-10-07 DIAGNOSIS — F410 Panic disorder [episodic paroxysmal anxiety] without agoraphobia: Secondary | ICD-10-CM | POA: Diagnosis not present

## 2019-10-07 DIAGNOSIS — M2550 Pain in unspecified joint: Secondary | ICD-10-CM | POA: Diagnosis not present

## 2019-10-07 DIAGNOSIS — F518 Other sleep disorders not due to a substance or known physiological condition: Secondary | ICD-10-CM | POA: Diagnosis not present

## 2019-10-07 DIAGNOSIS — N1832 Chronic kidney disease, stage 3b: Secondary | ICD-10-CM | POA: Diagnosis not present

## 2019-10-07 DIAGNOSIS — I10 Essential (primary) hypertension: Secondary | ICD-10-CM | POA: Diagnosis not present

## 2019-10-07 DIAGNOSIS — R7881 Bacteremia: Secondary | ICD-10-CM | POA: Diagnosis not present

## 2019-10-07 DIAGNOSIS — E8881 Metabolic syndrome: Secondary | ICD-10-CM | POA: Diagnosis not present

## 2019-10-07 DIAGNOSIS — R109 Unspecified abdominal pain: Secondary | ICD-10-CM | POA: Diagnosis not present

## 2019-10-07 DIAGNOSIS — R300 Dysuria: Secondary | ICD-10-CM | POA: Diagnosis not present

## 2019-10-07 DIAGNOSIS — M25561 Pain in right knee: Secondary | ICD-10-CM | POA: Diagnosis not present

## 2019-10-07 DIAGNOSIS — I700 Atherosclerosis of aorta: Secondary | ICD-10-CM | POA: Diagnosis not present

## 2019-10-07 DIAGNOSIS — I4891 Unspecified atrial fibrillation: Secondary | ICD-10-CM | POA: Diagnosis not present

## 2019-10-07 DIAGNOSIS — K279 Peptic ulcer, site unspecified, unspecified as acute or chronic, without hemorrhage or perforation: Secondary | ICD-10-CM | POA: Diagnosis not present

## 2019-10-07 DIAGNOSIS — R5383 Other fatigue: Secondary | ICD-10-CM | POA: Diagnosis not present

## 2019-10-07 DIAGNOSIS — Z1211 Encounter for screening for malignant neoplasm of colon: Secondary | ICD-10-CM | POA: Diagnosis not present

## 2019-10-07 DIAGNOSIS — N183 Chronic kidney disease, stage 3 unspecified: Secondary | ICD-10-CM | POA: Diagnosis not present

## 2019-10-07 DIAGNOSIS — Z6821 Body mass index (BMI) 21.0-21.9, adult: Secondary | ICD-10-CM | POA: Diagnosis not present

## 2019-10-07 DIAGNOSIS — K863 Pseudocyst of pancreas: Secondary | ICD-10-CM | POA: Diagnosis not present

## 2019-10-07 DIAGNOSIS — E1165 Type 2 diabetes mellitus with hyperglycemia: Secondary | ICD-10-CM | POA: Diagnosis not present

## 2019-10-07 DIAGNOSIS — I69398 Other sequelae of cerebral infarction: Secondary | ICD-10-CM | POA: Diagnosis not present

## 2019-10-07 DIAGNOSIS — M810 Age-related osteoporosis without current pathological fracture: Secondary | ICD-10-CM | POA: Diagnosis not present

## 2019-10-07 DIAGNOSIS — F332 Major depressive disorder, recurrent severe without psychotic features: Secondary | ICD-10-CM | POA: Diagnosis not present

## 2019-10-07 DIAGNOSIS — Z792 Long term (current) use of antibiotics: Secondary | ICD-10-CM | POA: Diagnosis not present

## 2019-10-07 DIAGNOSIS — F331 Major depressive disorder, recurrent, moderate: Secondary | ICD-10-CM | POA: Diagnosis not present

## 2019-10-07 DIAGNOSIS — Z1212 Encounter for screening for malignant neoplasm of rectum: Secondary | ICD-10-CM | POA: Diagnosis not present

## 2019-10-07 DIAGNOSIS — E1122 Type 2 diabetes mellitus with diabetic chronic kidney disease: Secondary | ICD-10-CM | POA: Diagnosis not present

## 2019-10-07 DIAGNOSIS — E039 Hypothyroidism, unspecified: Secondary | ICD-10-CM | POA: Diagnosis not present

## 2019-10-07 DIAGNOSIS — Z111 Encounter for screening for respiratory tuberculosis: Secondary | ICD-10-CM | POA: Diagnosis not present

## 2019-10-07 DIAGNOSIS — Z7982 Long term (current) use of aspirin: Secondary | ICD-10-CM | POA: Diagnosis not present

## 2019-10-07 DIAGNOSIS — H9190 Unspecified hearing loss, unspecified ear: Secondary | ICD-10-CM | POA: Diagnosis not present

## 2019-10-07 DIAGNOSIS — R110 Nausea: Secondary | ICD-10-CM | POA: Diagnosis not present

## 2019-10-07 DIAGNOSIS — Z8042 Family history of malignant neoplasm of prostate: Secondary | ICD-10-CM | POA: Diagnosis not present

## 2019-10-07 DIAGNOSIS — Z452 Encounter for adjustment and management of vascular access device: Secondary | ICD-10-CM | POA: Diagnosis not present

## 2019-10-07 DIAGNOSIS — E783 Hyperchylomicronemia: Secondary | ICD-10-CM | POA: Diagnosis not present

## 2019-10-07 DIAGNOSIS — G35 Multiple sclerosis: Secondary | ICD-10-CM | POA: Diagnosis not present

## 2019-10-07 DIAGNOSIS — B9561 Methicillin susceptible Staphylococcus aureus infection as the cause of diseases classified elsewhere: Secondary | ICD-10-CM | POA: Diagnosis not present

## 2019-10-07 DIAGNOSIS — M25572 Pain in left ankle and joints of left foot: Secondary | ICD-10-CM | POA: Diagnosis not present

## 2019-10-07 DIAGNOSIS — M6281 Muscle weakness (generalized): Secondary | ICD-10-CM | POA: Diagnosis not present

## 2019-10-07 DIAGNOSIS — E782 Mixed hyperlipidemia: Secondary | ICD-10-CM | POA: Diagnosis not present

## 2019-10-07 DIAGNOSIS — Z131 Encounter for screening for diabetes mellitus: Secondary | ICD-10-CM | POA: Diagnosis not present

## 2019-10-07 DIAGNOSIS — E0590 Thyrotoxicosis, unspecified without thyrotoxic crisis or storm: Secondary | ICD-10-CM | POA: Diagnosis not present

## 2019-10-07 DIAGNOSIS — R7303 Prediabetes: Secondary | ICD-10-CM | POA: Diagnosis not present

## 2019-10-07 DIAGNOSIS — R269 Unspecified abnormalities of gait and mobility: Secondary | ICD-10-CM | POA: Diagnosis not present

## 2019-10-07 DIAGNOSIS — F0280 Dementia in other diseases classified elsewhere without behavioral disturbance: Secondary | ICD-10-CM | POA: Diagnosis not present

## 2019-10-07 DIAGNOSIS — D3000 Benign neoplasm of unspecified kidney: Secondary | ICD-10-CM | POA: Diagnosis not present

## 2019-10-07 DIAGNOSIS — S72012D Unspecified intracapsular fracture of left femur, subsequent encounter for closed fracture with routine healing: Secondary | ICD-10-CM | POA: Diagnosis not present

## 2019-10-07 DIAGNOSIS — I252 Old myocardial infarction: Secondary | ICD-10-CM | POA: Diagnosis not present

## 2019-10-07 DIAGNOSIS — D509 Iron deficiency anemia, unspecified: Secondary | ICD-10-CM | POA: Diagnosis not present

## 2019-10-07 DIAGNOSIS — Z0000 Encounter for general adult medical examination without abnormal findings: Secondary | ICD-10-CM | POA: Diagnosis not present

## 2019-10-07 DIAGNOSIS — G300 Alzheimer's disease with early onset: Secondary | ICD-10-CM | POA: Diagnosis not present

## 2019-10-07 DIAGNOSIS — M0609 Rheumatoid arthritis without rheumatoid factor, multiple sites: Secondary | ICD-10-CM | POA: Diagnosis not present

## 2019-10-07 DIAGNOSIS — S2232XD Fracture of one rib, left side, subsequent encounter for fracture with routine healing: Secondary | ICD-10-CM | POA: Diagnosis not present

## 2019-10-07 DIAGNOSIS — Z8673 Personal history of transient ischemic attack (TIA), and cerebral infarction without residual deficits: Secondary | ICD-10-CM | POA: Diagnosis not present

## 2019-10-07 DIAGNOSIS — I83019 Varicose veins of right lower extremity with ulcer of unspecified site: Secondary | ICD-10-CM | POA: Diagnosis not present

## 2019-10-07 DIAGNOSIS — R7989 Other specified abnormal findings of blood chemistry: Secondary | ICD-10-CM | POA: Diagnosis not present

## 2019-10-07 DIAGNOSIS — Z8041 Family history of malignant neoplasm of ovary: Secondary | ICD-10-CM | POA: Diagnosis not present

## 2019-10-07 DIAGNOSIS — N289 Disorder of kidney and ureter, unspecified: Secondary | ICD-10-CM | POA: Diagnosis not present

## 2019-10-07 DIAGNOSIS — K8020 Calculus of gallbladder without cholecystitis without obstruction: Secondary | ICD-10-CM | POA: Diagnosis not present

## 2019-10-07 DIAGNOSIS — R399 Unspecified symptoms and signs involving the genitourinary system: Secondary | ICD-10-CM | POA: Diagnosis not present

## 2019-10-07 DIAGNOSIS — Z0289 Encounter for other administrative examinations: Secondary | ICD-10-CM | POA: Diagnosis not present

## 2019-10-07 DIAGNOSIS — N390 Urinary tract infection, site not specified: Secondary | ICD-10-CM | POA: Diagnosis not present

## 2019-10-07 DIAGNOSIS — I69354 Hemiplegia and hemiparesis following cerebral infarction affecting left non-dominant side: Secondary | ICD-10-CM | POA: Diagnosis not present

## 2019-10-07 DIAGNOSIS — M48061 Spinal stenosis, lumbar region without neurogenic claudication: Secondary | ICD-10-CM | POA: Diagnosis not present

## 2019-10-07 DIAGNOSIS — I714 Abdominal aortic aneurysm, without rupture: Secondary | ICD-10-CM | POA: Diagnosis not present

## 2019-10-07 DIAGNOSIS — Z79899 Other long term (current) drug therapy: Secondary | ICD-10-CM | POA: Diagnosis not present

## 2019-10-07 DIAGNOSIS — M0579 Rheumatoid arthritis with rheumatoid factor of multiple sites without organ or systems involvement: Secondary | ICD-10-CM | POA: Diagnosis not present

## 2019-10-07 DIAGNOSIS — Z841 Family history of disorders of kidney and ureter: Secondary | ICD-10-CM | POA: Diagnosis not present

## 2019-10-07 DIAGNOSIS — E10649 Type 1 diabetes mellitus with hypoglycemia without coma: Secondary | ICD-10-CM | POA: Diagnosis not present

## 2019-10-07 DIAGNOSIS — G8929 Other chronic pain: Secondary | ICD-10-CM | POA: Diagnosis not present

## 2019-10-07 DIAGNOSIS — B0229 Other postherpetic nervous system involvement: Secondary | ICD-10-CM | POA: Diagnosis not present

## 2019-10-07 DIAGNOSIS — Z881 Allergy status to other antibiotic agents status: Secondary | ICD-10-CM | POA: Diagnosis not present

## 2019-10-07 DIAGNOSIS — I4819 Other persistent atrial fibrillation: Secondary | ICD-10-CM | POA: Diagnosis not present

## 2019-10-07 DIAGNOSIS — M5430 Sciatica, unspecified side: Secondary | ICD-10-CM | POA: Diagnosis not present

## 2019-10-07 DIAGNOSIS — E538 Deficiency of other specified B group vitamins: Secondary | ICD-10-CM | POA: Diagnosis not present

## 2019-10-07 DIAGNOSIS — R2681 Unsteadiness on feet: Secondary | ICD-10-CM | POA: Diagnosis not present

## 2019-10-07 DIAGNOSIS — I350 Nonrheumatic aortic (valve) stenosis: Secondary | ICD-10-CM | POA: Diagnosis not present

## 2019-10-07 DIAGNOSIS — E1151 Type 2 diabetes mellitus with diabetic peripheral angiopathy without gangrene: Secondary | ICD-10-CM | POA: Diagnosis not present

## 2019-10-07 DIAGNOSIS — I119 Hypertensive heart disease without heart failure: Secondary | ICD-10-CM | POA: Diagnosis not present

## 2019-10-07 DIAGNOSIS — I480 Paroxysmal atrial fibrillation: Secondary | ICD-10-CM | POA: Diagnosis not present

## 2019-10-07 DIAGNOSIS — R079 Chest pain, unspecified: Secondary | ICD-10-CM | POA: Diagnosis not present

## 2019-10-07 DIAGNOSIS — G9009 Other idiopathic peripheral autonomic neuropathy: Secondary | ICD-10-CM | POA: Diagnosis not present

## 2019-10-08 DIAGNOSIS — R310 Gross hematuria: Secondary | ICD-10-CM | POA: Diagnosis not present

## 2019-10-08 DIAGNOSIS — M48062 Spinal stenosis, lumbar region with neurogenic claudication: Secondary | ICD-10-CM | POA: Diagnosis not present

## 2019-10-08 DIAGNOSIS — F3341 Major depressive disorder, recurrent, in partial remission: Secondary | ICD-10-CM | POA: Diagnosis not present

## 2019-10-08 DIAGNOSIS — I129 Hypertensive chronic kidney disease with stage 1 through stage 4 chronic kidney disease, or unspecified chronic kidney disease: Secondary | ICD-10-CM | POA: Diagnosis not present

## 2019-10-08 DIAGNOSIS — E038 Other specified hypothyroidism: Secondary | ICD-10-CM | POA: Diagnosis not present

## 2019-10-08 DIAGNOSIS — R300 Dysuria: Secondary | ICD-10-CM | POA: Diagnosis not present

## 2019-10-08 DIAGNOSIS — I359 Nonrheumatic aortic valve disorder, unspecified: Secondary | ICD-10-CM | POA: Diagnosis not present

## 2019-10-08 DIAGNOSIS — D518 Other vitamin B12 deficiency anemias: Secondary | ICD-10-CM | POA: Diagnosis not present

## 2019-10-08 DIAGNOSIS — Z125 Encounter for screening for malignant neoplasm of prostate: Secondary | ICD-10-CM | POA: Diagnosis not present

## 2019-10-08 DIAGNOSIS — E559 Vitamin D deficiency, unspecified: Secondary | ICD-10-CM | POA: Diagnosis not present

## 2019-10-08 DIAGNOSIS — K219 Gastro-esophageal reflux disease without esophagitis: Secondary | ICD-10-CM | POA: Diagnosis not present

## 2019-10-08 DIAGNOSIS — R6889 Other general symptoms and signs: Secondary | ICD-10-CM | POA: Diagnosis not present

## 2019-10-08 DIAGNOSIS — E669 Obesity, unspecified: Secondary | ICD-10-CM | POA: Diagnosis not present

## 2019-10-08 DIAGNOSIS — Z8546 Personal history of malignant neoplasm of prostate: Secondary | ICD-10-CM | POA: Diagnosis not present

## 2019-10-08 DIAGNOSIS — Z1159 Encounter for screening for other viral diseases: Secondary | ICD-10-CM | POA: Diagnosis not present

## 2019-10-08 DIAGNOSIS — G250 Essential tremor: Secondary | ICD-10-CM | POA: Diagnosis not present

## 2019-10-08 DIAGNOSIS — R1084 Generalized abdominal pain: Secondary | ICD-10-CM | POA: Diagnosis not present

## 2019-10-08 DIAGNOSIS — R7303 Prediabetes: Secondary | ICD-10-CM | POA: Diagnosis not present

## 2019-10-08 DIAGNOSIS — E7849 Other hyperlipidemia: Secondary | ICD-10-CM | POA: Diagnosis not present

## 2019-10-08 DIAGNOSIS — J432 Centrilobular emphysema: Secondary | ICD-10-CM | POA: Diagnosis not present

## 2019-10-08 DIAGNOSIS — E782 Mixed hyperlipidemia: Secondary | ICD-10-CM | POA: Diagnosis not present

## 2019-10-08 DIAGNOSIS — Z961 Presence of intraocular lens: Secondary | ICD-10-CM | POA: Diagnosis not present

## 2019-10-08 DIAGNOSIS — I5040 Unspecified combined systolic (congestive) and diastolic (congestive) heart failure: Secondary | ICD-10-CM | POA: Diagnosis not present

## 2019-10-08 DIAGNOSIS — I2510 Atherosclerotic heart disease of native coronary artery without angina pectoris: Secondary | ICD-10-CM | POA: Diagnosis not present

## 2019-10-08 DIAGNOSIS — Z1329 Encounter for screening for other suspected endocrine disorder: Secondary | ICD-10-CM | POA: Diagnosis not present

## 2019-10-08 DIAGNOSIS — E43 Unspecified severe protein-calorie malnutrition: Secondary | ICD-10-CM | POA: Diagnosis not present

## 2019-10-08 DIAGNOSIS — K5730 Diverticulosis of large intestine without perforation or abscess without bleeding: Secondary | ICD-10-CM | POA: Diagnosis not present

## 2019-10-08 DIAGNOSIS — R768 Other specified abnormal immunological findings in serum: Secondary | ICD-10-CM | POA: Diagnosis not present

## 2019-10-08 DIAGNOSIS — D638 Anemia in other chronic diseases classified elsewhere: Secondary | ICD-10-CM | POA: Diagnosis not present

## 2019-10-08 DIAGNOSIS — S81801A Unspecified open wound, right lower leg, initial encounter: Secondary | ICD-10-CM | POA: Diagnosis not present

## 2019-10-08 DIAGNOSIS — R350 Frequency of micturition: Secondary | ICD-10-CM | POA: Diagnosis not present

## 2019-10-08 DIAGNOSIS — M359 Systemic involvement of connective tissue, unspecified: Secondary | ICD-10-CM | POA: Diagnosis not present

## 2019-10-08 DIAGNOSIS — G629 Polyneuropathy, unspecified: Secondary | ICD-10-CM | POA: Diagnosis not present

## 2019-10-08 DIAGNOSIS — R338 Other retention of urine: Secondary | ICD-10-CM | POA: Diagnosis not present

## 2019-10-08 DIAGNOSIS — I10 Essential (primary) hypertension: Secondary | ICD-10-CM | POA: Diagnosis not present

## 2019-10-08 DIAGNOSIS — M5030 Other cervical disc degeneration, unspecified cervical region: Secondary | ICD-10-CM | POA: Diagnosis not present

## 2019-10-08 DIAGNOSIS — D62 Acute posthemorrhagic anemia: Secondary | ICD-10-CM | POA: Diagnosis not present

## 2019-10-08 DIAGNOSIS — M5417 Radiculopathy, lumbosacral region: Secondary | ICD-10-CM | POA: Diagnosis not present

## 2019-10-08 DIAGNOSIS — M059 Rheumatoid arthritis with rheumatoid factor, unspecified: Secondary | ICD-10-CM | POA: Diagnosis not present

## 2019-10-08 DIAGNOSIS — I714 Abdominal aortic aneurysm, without rupture: Secondary | ICD-10-CM | POA: Diagnosis not present

## 2019-10-08 DIAGNOSIS — R5381 Other malaise: Secondary | ICD-10-CM | POA: Diagnosis not present

## 2019-10-08 DIAGNOSIS — R7301 Impaired fasting glucose: Secondary | ICD-10-CM | POA: Diagnosis not present

## 2019-10-08 DIAGNOSIS — G4700 Insomnia, unspecified: Secondary | ICD-10-CM | POA: Diagnosis not present

## 2019-10-08 DIAGNOSIS — Z0000 Encounter for general adult medical examination without abnormal findings: Secondary | ICD-10-CM | POA: Diagnosis not present

## 2019-10-08 DIAGNOSIS — N184 Chronic kidney disease, stage 4 (severe): Secondary | ICD-10-CM | POA: Diagnosis not present

## 2019-10-08 DIAGNOSIS — N189 Chronic kidney disease, unspecified: Secondary | ICD-10-CM | POA: Diagnosis not present

## 2019-10-08 DIAGNOSIS — G4733 Obstructive sleep apnea (adult) (pediatric): Secondary | ICD-10-CM | POA: Diagnosis not present

## 2019-10-08 DIAGNOSIS — Z79899 Other long term (current) drug therapy: Secondary | ICD-10-CM | POA: Diagnosis not present

## 2019-10-08 DIAGNOSIS — Z7689 Persons encountering health services in other specified circumstances: Secondary | ICD-10-CM | POA: Diagnosis not present

## 2019-10-08 DIAGNOSIS — Z466 Encounter for fitting and adjustment of urinary device: Secondary | ICD-10-CM | POA: Diagnosis not present

## 2019-10-08 DIAGNOSIS — L89152 Pressure ulcer of sacral region, stage 2: Secondary | ICD-10-CM | POA: Diagnosis not present

## 2019-10-08 DIAGNOSIS — Z5181 Encounter for therapeutic drug level monitoring: Secondary | ICD-10-CM | POA: Diagnosis not present

## 2019-10-08 DIAGNOSIS — M1990 Unspecified osteoarthritis, unspecified site: Secondary | ICD-10-CM | POA: Diagnosis not present

## 2019-10-08 DIAGNOSIS — D509 Iron deficiency anemia, unspecified: Secondary | ICD-10-CM | POA: Diagnosis not present

## 2019-10-08 DIAGNOSIS — Z13220 Encounter for screening for lipoid disorders: Secondary | ICD-10-CM | POA: Diagnosis not present

## 2019-10-08 DIAGNOSIS — R931 Abnormal findings on diagnostic imaging of heart and coronary circulation: Secondary | ICD-10-CM | POA: Diagnosis not present

## 2019-10-08 DIAGNOSIS — J9610 Chronic respiratory failure, unspecified whether with hypoxia or hypercapnia: Secondary | ICD-10-CM | POA: Diagnosis not present

## 2019-10-08 DIAGNOSIS — E1169 Type 2 diabetes mellitus with other specified complication: Secondary | ICD-10-CM | POA: Diagnosis not present

## 2019-10-08 DIAGNOSIS — R600 Localized edema: Secondary | ICD-10-CM | POA: Diagnosis not present

## 2019-10-08 DIAGNOSIS — Z87891 Personal history of nicotine dependence: Secondary | ICD-10-CM | POA: Diagnosis not present

## 2019-10-08 DIAGNOSIS — E0590 Thyrotoxicosis, unspecified without thyrotoxic crisis or storm: Secondary | ICD-10-CM | POA: Diagnosis not present

## 2019-10-08 DIAGNOSIS — K7200 Acute and subacute hepatic failure without coma: Secondary | ICD-10-CM | POA: Diagnosis not present

## 2019-10-08 DIAGNOSIS — M5416 Radiculopathy, lumbar region: Secondary | ICD-10-CM | POA: Diagnosis not present

## 2019-10-08 DIAGNOSIS — I252 Old myocardial infarction: Secondary | ICD-10-CM | POA: Diagnosis not present

## 2019-10-08 DIAGNOSIS — R0789 Other chest pain: Secondary | ICD-10-CM | POA: Diagnosis not present

## 2019-10-08 DIAGNOSIS — M109 Gout, unspecified: Secondary | ICD-10-CM | POA: Diagnosis not present

## 2019-10-08 DIAGNOSIS — J449 Chronic obstructive pulmonary disease, unspecified: Secondary | ICD-10-CM | POA: Diagnosis not present

## 2019-10-08 DIAGNOSIS — E063 Autoimmune thyroiditis: Secondary | ICD-10-CM | POA: Diagnosis not present

## 2019-10-08 DIAGNOSIS — Z20828 Contact with and (suspected) exposure to other viral communicable diseases: Secondary | ICD-10-CM | POA: Diagnosis not present

## 2019-10-08 DIAGNOSIS — Z1211 Encounter for screening for malignant neoplasm of colon: Secondary | ICD-10-CM | POA: Diagnosis not present

## 2019-10-08 DIAGNOSIS — E291 Testicular hypofunction: Secondary | ICD-10-CM | POA: Diagnosis not present

## 2019-10-08 DIAGNOSIS — E785 Hyperlipidemia, unspecified: Secondary | ICD-10-CM | POA: Diagnosis not present

## 2019-10-08 DIAGNOSIS — M791 Myalgia, unspecified site: Secondary | ICD-10-CM | POA: Diagnosis not present

## 2019-10-08 DIAGNOSIS — E1165 Type 2 diabetes mellitus with hyperglycemia: Secondary | ICD-10-CM | POA: Diagnosis not present

## 2019-10-08 DIAGNOSIS — F329 Major depressive disorder, single episode, unspecified: Secondary | ICD-10-CM | POA: Diagnosis not present

## 2019-10-08 DIAGNOSIS — F332 Major depressive disorder, recurrent severe without psychotic features: Secondary | ICD-10-CM | POA: Diagnosis not present

## 2019-10-08 DIAGNOSIS — L509 Urticaria, unspecified: Secondary | ICD-10-CM | POA: Diagnosis not present

## 2019-10-08 DIAGNOSIS — K743 Primary biliary cirrhosis: Secondary | ICD-10-CM | POA: Diagnosis not present

## 2019-10-08 DIAGNOSIS — Z9181 History of falling: Secondary | ICD-10-CM | POA: Diagnosis not present

## 2019-10-08 DIAGNOSIS — E611 Iron deficiency: Secondary | ICD-10-CM | POA: Diagnosis not present

## 2019-10-08 DIAGNOSIS — E1140 Type 2 diabetes mellitus with diabetic neuropathy, unspecified: Secondary | ICD-10-CM | POA: Diagnosis not present

## 2019-10-08 DIAGNOSIS — E7800 Pure hypercholesterolemia, unspecified: Secondary | ICD-10-CM | POA: Diagnosis not present

## 2019-10-08 DIAGNOSIS — J42 Unspecified chronic bronchitis: Secondary | ICD-10-CM | POA: Diagnosis not present

## 2019-10-08 DIAGNOSIS — M7062 Trochanteric bursitis, left hip: Secondary | ICD-10-CM | POA: Diagnosis not present

## 2019-10-08 DIAGNOSIS — E871 Hypo-osmolality and hyponatremia: Secondary | ICD-10-CM | POA: Diagnosis not present

## 2019-10-08 DIAGNOSIS — N401 Enlarged prostate with lower urinary tract symptoms: Secondary | ICD-10-CM | POA: Diagnosis not present

## 2019-10-08 DIAGNOSIS — M80031D Age-related osteoporosis with current pathological fracture, right forearm, subsequent encounter for fracture with routine healing: Secondary | ICD-10-CM | POA: Diagnosis not present

## 2019-10-08 DIAGNOSIS — D751 Secondary polycythemia: Secondary | ICD-10-CM | POA: Diagnosis not present

## 2019-10-08 DIAGNOSIS — Z6834 Body mass index (BMI) 34.0-34.9, adult: Secondary | ICD-10-CM | POA: Diagnosis not present

## 2019-10-08 DIAGNOSIS — E1121 Type 2 diabetes mellitus with diabetic nephropathy: Secondary | ICD-10-CM | POA: Diagnosis not present

## 2019-10-08 DIAGNOSIS — M5136 Other intervertebral disc degeneration, lumbar region: Secondary | ICD-10-CM | POA: Diagnosis not present

## 2019-10-08 DIAGNOSIS — E890 Postprocedural hypothyroidism: Secondary | ICD-10-CM | POA: Diagnosis not present

## 2019-10-08 DIAGNOSIS — I951 Orthostatic hypotension: Secondary | ICD-10-CM | POA: Diagnosis not present

## 2019-10-08 DIAGNOSIS — M810 Age-related osteoporosis without current pathological fracture: Secondary | ICD-10-CM | POA: Diagnosis not present

## 2019-10-08 DIAGNOSIS — E119 Type 2 diabetes mellitus without complications: Secondary | ICD-10-CM | POA: Diagnosis not present

## 2019-10-08 DIAGNOSIS — M5412 Radiculopathy, cervical region: Secondary | ICD-10-CM | POA: Diagnosis not present

## 2019-10-08 DIAGNOSIS — R3914 Feeling of incomplete bladder emptying: Secondary | ICD-10-CM | POA: Diagnosis not present

## 2019-10-08 DIAGNOSIS — D649 Anemia, unspecified: Secondary | ICD-10-CM | POA: Diagnosis not present

## 2019-10-08 DIAGNOSIS — F0391 Unspecified dementia with behavioral disturbance: Secondary | ICD-10-CM | POA: Diagnosis not present

## 2019-10-08 DIAGNOSIS — L659 Nonscarring hair loss, unspecified: Secondary | ICD-10-CM | POA: Diagnosis not present

## 2019-10-08 DIAGNOSIS — E118 Type 2 diabetes mellitus with unspecified complications: Secondary | ICD-10-CM | POA: Diagnosis not present

## 2019-10-08 DIAGNOSIS — R001 Bradycardia, unspecified: Secondary | ICD-10-CM | POA: Diagnosis not present

## 2019-10-08 DIAGNOSIS — N183 Chronic kidney disease, stage 3 unspecified: Secondary | ICD-10-CM | POA: Diagnosis not present

## 2019-10-08 DIAGNOSIS — R8290 Unspecified abnormal findings in urine: Secondary | ICD-10-CM | POA: Diagnosis not present

## 2019-10-08 DIAGNOSIS — M0579 Rheumatoid arthritis with rheumatoid factor of multiple sites without organ or systems involvement: Secondary | ICD-10-CM | POA: Diagnosis not present

## 2019-10-09 DIAGNOSIS — Z96653 Presence of artificial knee joint, bilateral: Secondary | ICD-10-CM | POA: Diagnosis not present

## 2019-10-09 DIAGNOSIS — Z981 Arthrodesis status: Secondary | ICD-10-CM | POA: Diagnosis not present

## 2019-10-09 DIAGNOSIS — L409 Psoriasis, unspecified: Secondary | ICD-10-CM | POA: Diagnosis not present

## 2019-10-09 DIAGNOSIS — D696 Thrombocytopenia, unspecified: Secondary | ICD-10-CM | POA: Diagnosis not present

## 2019-10-09 DIAGNOSIS — G43919 Migraine, unspecified, intractable, without status migrainosus: Secondary | ICD-10-CM | POA: Diagnosis not present

## 2019-10-09 DIAGNOSIS — Z825 Family history of asthma and other chronic lower respiratory diseases: Secondary | ICD-10-CM | POA: Diagnosis not present

## 2019-10-09 DIAGNOSIS — Z9981 Dependence on supplemental oxygen: Secondary | ICD-10-CM | POA: Diagnosis not present

## 2019-10-09 DIAGNOSIS — J9621 Acute and chronic respiratory failure with hypoxia: Secondary | ICD-10-CM | POA: Diagnosis not present

## 2019-10-09 DIAGNOSIS — Z96612 Presence of left artificial shoulder joint: Secondary | ICD-10-CM | POA: Diagnosis not present

## 2019-10-09 DIAGNOSIS — R945 Abnormal results of liver function studies: Secondary | ICD-10-CM | POA: Diagnosis not present

## 2019-10-09 DIAGNOSIS — G4733 Obstructive sleep apnea (adult) (pediatric): Secondary | ICD-10-CM | POA: Diagnosis not present

## 2019-10-09 DIAGNOSIS — E871 Hypo-osmolality and hyponatremia: Secondary | ICD-10-CM | POA: Diagnosis not present

## 2019-10-09 DIAGNOSIS — F329 Major depressive disorder, single episode, unspecified: Secondary | ICD-10-CM | POA: Diagnosis not present

## 2019-10-09 DIAGNOSIS — R05 Cough: Secondary | ICD-10-CM | POA: Diagnosis not present

## 2019-10-09 DIAGNOSIS — Z79899 Other long term (current) drug therapy: Secondary | ICD-10-CM | POA: Diagnosis not present

## 2019-10-09 DIAGNOSIS — F419 Anxiety disorder, unspecified: Secondary | ICD-10-CM | POA: Diagnosis not present

## 2019-10-09 DIAGNOSIS — D72819 Decreased white blood cell count, unspecified: Secondary | ICD-10-CM | POA: Diagnosis not present

## 2019-10-09 DIAGNOSIS — U071 COVID-19: Secondary | ICD-10-CM | POA: Diagnosis not present

## 2019-10-09 DIAGNOSIS — K219 Gastro-esophageal reflux disease without esophagitis: Secondary | ICD-10-CM | POA: Diagnosis not present

## 2019-10-09 DIAGNOSIS — Z96611 Presence of right artificial shoulder joint: Secondary | ICD-10-CM | POA: Diagnosis not present

## 2019-10-09 DIAGNOSIS — J439 Emphysema, unspecified: Secondary | ICD-10-CM | POA: Diagnosis not present

## 2019-10-09 DIAGNOSIS — E861 Hypovolemia: Secondary | ICD-10-CM | POA: Diagnosis not present

## 2019-10-09 DIAGNOSIS — Z87891 Personal history of nicotine dependence: Secondary | ICD-10-CM | POA: Diagnosis not present

## 2019-10-09 DIAGNOSIS — J1289 Other viral pneumonia: Secondary | ICD-10-CM | POA: Diagnosis not present

## 2019-10-09 DIAGNOSIS — R7401 Elevation of levels of liver transaminase levels: Secondary | ICD-10-CM | POA: Diagnosis not present

## 2019-10-10 DIAGNOSIS — I2510 Atherosclerotic heart disease of native coronary artery without angina pectoris: Secondary | ICD-10-CM | POA: Diagnosis not present

## 2019-10-10 DIAGNOSIS — F988 Other specified behavioral and emotional disorders with onset usually occurring in childhood and adolescence: Secondary | ICD-10-CM | POA: Diagnosis not present

## 2019-10-10 DIAGNOSIS — R7303 Prediabetes: Secondary | ICD-10-CM | POA: Diagnosis not present

## 2019-10-10 DIAGNOSIS — M150 Primary generalized (osteo)arthritis: Secondary | ICD-10-CM | POA: Diagnosis not present

## 2019-10-10 DIAGNOSIS — M5416 Radiculopathy, lumbar region: Secondary | ICD-10-CM | POA: Diagnosis not present

## 2019-10-10 DIAGNOSIS — M353 Polymyalgia rheumatica: Secondary | ICD-10-CM | POA: Diagnosis not present

## 2019-10-10 DIAGNOSIS — E785 Hyperlipidemia, unspecified: Secondary | ICD-10-CM | POA: Diagnosis not present

## 2019-10-10 DIAGNOSIS — G7281 Critical illness myopathy: Secondary | ICD-10-CM | POA: Diagnosis not present

## 2019-10-10 DIAGNOSIS — E46 Unspecified protein-calorie malnutrition: Secondary | ICD-10-CM | POA: Diagnosis not present

## 2019-10-10 DIAGNOSIS — I252 Old myocardial infarction: Secondary | ICD-10-CM | POA: Diagnosis not present

## 2019-10-10 DIAGNOSIS — F411 Generalized anxiety disorder: Secondary | ICD-10-CM | POA: Diagnosis not present

## 2019-10-10 DIAGNOSIS — I10 Essential (primary) hypertension: Secondary | ICD-10-CM | POA: Diagnosis not present

## 2019-10-10 DIAGNOSIS — Z20828 Contact with and (suspected) exposure to other viral communicable diseases: Secondary | ICD-10-CM | POA: Diagnosis not present

## 2019-10-10 DIAGNOSIS — J9611 Chronic respiratory failure with hypoxia: Secondary | ICD-10-CM | POA: Diagnosis not present

## 2019-10-11 DIAGNOSIS — M19019 Primary osteoarthritis, unspecified shoulder: Secondary | ICD-10-CM | POA: Diagnosis not present

## 2019-10-11 DIAGNOSIS — M7050 Other bursitis of knee, unspecified knee: Secondary | ICD-10-CM | POA: Diagnosis not present

## 2019-10-11 DIAGNOSIS — D126 Benign neoplasm of colon, unspecified: Secondary | ICD-10-CM | POA: Diagnosis not present

## 2019-10-11 DIAGNOSIS — Z0000 Encounter for general adult medical examination without abnormal findings: Secondary | ICD-10-CM | POA: Diagnosis not present

## 2019-10-11 DIAGNOSIS — M79675 Pain in left toe(s): Secondary | ICD-10-CM | POA: Diagnosis not present

## 2019-10-11 DIAGNOSIS — M79674 Pain in right toe(s): Secondary | ICD-10-CM | POA: Diagnosis not present

## 2019-10-11 DIAGNOSIS — M170 Bilateral primary osteoarthritis of knee: Secondary | ICD-10-CM | POA: Diagnosis not present

## 2019-10-11 DIAGNOSIS — K5902 Outlet dysfunction constipation: Secondary | ICD-10-CM | POA: Diagnosis not present

## 2019-10-11 DIAGNOSIS — R451 Restlessness and agitation: Secondary | ICD-10-CM | POA: Diagnosis not present

## 2019-10-11 DIAGNOSIS — M6289 Other specified disorders of muscle: Secondary | ICD-10-CM | POA: Diagnosis not present

## 2019-10-11 DIAGNOSIS — E789 Disorder of lipoprotein metabolism, unspecified: Secondary | ICD-10-CM | POA: Diagnosis not present

## 2019-10-11 DIAGNOSIS — D649 Anemia, unspecified: Secondary | ICD-10-CM | POA: Diagnosis not present

## 2019-10-11 DIAGNOSIS — N184 Chronic kidney disease, stage 4 (severe): Secondary | ICD-10-CM | POA: Diagnosis not present

## 2019-10-11 DIAGNOSIS — Z743 Need for continuous supervision: Secondary | ICD-10-CM | POA: Diagnosis not present

## 2019-10-11 DIAGNOSIS — F0391 Unspecified dementia with behavioral disturbance: Secondary | ICD-10-CM | POA: Diagnosis not present

## 2019-10-11 DIAGNOSIS — Z791 Long term (current) use of non-steroidal anti-inflammatories (NSAID): Secondary | ICD-10-CM | POA: Diagnosis not present

## 2019-10-11 DIAGNOSIS — Z6841 Body Mass Index (BMI) 40.0 and over, adult: Secondary | ICD-10-CM | POA: Diagnosis not present

## 2019-10-11 DIAGNOSIS — E1165 Type 2 diabetes mellitus with hyperglycemia: Secondary | ICD-10-CM | POA: Diagnosis not present

## 2019-10-11 DIAGNOSIS — E039 Hypothyroidism, unspecified: Secondary | ICD-10-CM | POA: Diagnosis not present

## 2019-10-11 DIAGNOSIS — B9681 Helicobacter pylori [H. pylori] as the cause of diseases classified elsewhere: Secondary | ICD-10-CM | POA: Diagnosis not present

## 2019-10-11 DIAGNOSIS — K279 Peptic ulcer, site unspecified, unspecified as acute or chronic, without hemorrhage or perforation: Secondary | ICD-10-CM | POA: Diagnosis not present

## 2019-10-11 DIAGNOSIS — J4520 Mild intermittent asthma, uncomplicated: Secondary | ICD-10-CM | POA: Diagnosis not present

## 2019-10-11 DIAGNOSIS — S9031XA Contusion of right foot, initial encounter: Secondary | ICD-10-CM | POA: Diagnosis not present

## 2019-10-11 DIAGNOSIS — Z7984 Long term (current) use of oral hypoglycemic drugs: Secondary | ICD-10-CM | POA: Diagnosis not present

## 2019-10-11 DIAGNOSIS — I5032 Chronic diastolic (congestive) heart failure: Secondary | ICD-10-CM | POA: Diagnosis not present

## 2019-10-11 DIAGNOSIS — E785 Hyperlipidemia, unspecified: Secondary | ICD-10-CM | POA: Diagnosis not present

## 2019-10-11 DIAGNOSIS — Z87440 Personal history of urinary (tract) infections: Secondary | ICD-10-CM | POA: Diagnosis not present

## 2019-10-11 DIAGNOSIS — N816 Rectocele: Secondary | ICD-10-CM | POA: Diagnosis not present

## 2019-10-11 DIAGNOSIS — I482 Chronic atrial fibrillation, unspecified: Secondary | ICD-10-CM | POA: Diagnosis not present

## 2019-10-11 DIAGNOSIS — R319 Hematuria, unspecified: Secondary | ICD-10-CM | POA: Diagnosis not present

## 2019-10-11 DIAGNOSIS — F329 Major depressive disorder, single episode, unspecified: Secondary | ICD-10-CM | POA: Diagnosis not present

## 2019-10-11 DIAGNOSIS — F411 Generalized anxiety disorder: Secondary | ICD-10-CM | POA: Diagnosis not present

## 2019-10-11 DIAGNOSIS — Z23 Encounter for immunization: Secondary | ICD-10-CM | POA: Diagnosis not present

## 2019-10-11 DIAGNOSIS — U071 COVID-19: Secondary | ICD-10-CM | POA: Diagnosis not present

## 2019-10-11 DIAGNOSIS — N201 Calculus of ureter: Secondary | ICD-10-CM | POA: Diagnosis not present

## 2019-10-11 DIAGNOSIS — N183 Chronic kidney disease, stage 3 unspecified: Secondary | ICD-10-CM | POA: Diagnosis not present

## 2019-10-11 DIAGNOSIS — E119 Type 2 diabetes mellitus without complications: Secondary | ICD-10-CM | POA: Diagnosis not present

## 2019-10-11 DIAGNOSIS — M545 Low back pain: Secondary | ICD-10-CM | POA: Diagnosis not present

## 2019-10-11 DIAGNOSIS — I5033 Acute on chronic diastolic (congestive) heart failure: Secondary | ICD-10-CM | POA: Diagnosis not present

## 2019-10-11 DIAGNOSIS — G3184 Mild cognitive impairment, so stated: Secondary | ICD-10-CM | POA: Diagnosis not present

## 2019-10-11 DIAGNOSIS — R279 Unspecified lack of coordination: Secondary | ICD-10-CM | POA: Diagnosis not present

## 2019-10-11 DIAGNOSIS — J1289 Other viral pneumonia: Secondary | ICD-10-CM | POA: Diagnosis not present

## 2019-10-11 DIAGNOSIS — Z7901 Long term (current) use of anticoagulants: Secondary | ICD-10-CM | POA: Diagnosis not present

## 2019-10-11 DIAGNOSIS — F332 Major depressive disorder, recurrent severe without psychotic features: Secondary | ICD-10-CM | POA: Diagnosis not present

## 2019-10-11 DIAGNOSIS — M1711 Unilateral primary osteoarthritis, right knee: Secondary | ICD-10-CM | POA: Diagnosis not present

## 2019-10-11 DIAGNOSIS — N3941 Urge incontinence: Secondary | ICD-10-CM | POA: Diagnosis not present

## 2019-10-11 DIAGNOSIS — I130 Hypertensive heart and chronic kidney disease with heart failure and stage 1 through stage 4 chronic kidney disease, or unspecified chronic kidney disease: Secondary | ICD-10-CM | POA: Diagnosis not present

## 2019-10-11 DIAGNOSIS — J9601 Acute respiratory failure with hypoxia: Secondary | ICD-10-CM | POA: Diagnosis not present

## 2019-10-11 DIAGNOSIS — E11649 Type 2 diabetes mellitus with hypoglycemia without coma: Secondary | ICD-10-CM | POA: Diagnosis not present

## 2019-10-11 DIAGNOSIS — G4733 Obstructive sleep apnea (adult) (pediatric): Secondary | ICD-10-CM | POA: Diagnosis not present

## 2019-10-11 DIAGNOSIS — Z5181 Encounter for therapeutic drug level monitoring: Secondary | ICD-10-CM | POA: Diagnosis not present

## 2019-10-11 DIAGNOSIS — I10 Essential (primary) hypertension: Secondary | ICD-10-CM | POA: Diagnosis not present

## 2019-10-11 DIAGNOSIS — B351 Tinea unguium: Secondary | ICD-10-CM | POA: Diagnosis not present

## 2019-10-11 DIAGNOSIS — Z79899 Other long term (current) drug therapy: Secondary | ICD-10-CM | POA: Diagnosis not present

## 2019-10-11 DIAGNOSIS — Z833 Family history of diabetes mellitus: Secondary | ICD-10-CM | POA: Diagnosis not present

## 2019-10-11 DIAGNOSIS — R2681 Unsteadiness on feet: Secondary | ICD-10-CM | POA: Diagnosis not present

## 2019-10-11 DIAGNOSIS — M1712 Unilateral primary osteoarthritis, left knee: Secondary | ICD-10-CM | POA: Diagnosis not present

## 2019-10-11 DIAGNOSIS — R350 Frequency of micturition: Secondary | ICD-10-CM | POA: Diagnosis not present

## 2019-10-11 DIAGNOSIS — R5381 Other malaise: Secondary | ICD-10-CM | POA: Diagnosis not present

## 2019-10-11 DIAGNOSIS — N1831 Chronic kidney disease, stage 3a: Secondary | ICD-10-CM | POA: Diagnosis not present

## 2019-10-11 DIAGNOSIS — K623 Rectal prolapse: Secondary | ICD-10-CM | POA: Diagnosis not present

## 2019-10-11 DIAGNOSIS — M6281 Muscle weakness (generalized): Secondary | ICD-10-CM | POA: Diagnosis not present

## 2019-10-11 DIAGNOSIS — I890 Lymphedema, not elsewhere classified: Secondary | ICD-10-CM | POA: Diagnosis not present

## 2019-10-12 DIAGNOSIS — Z96651 Presence of right artificial knee joint: Secondary | ICD-10-CM | POA: Diagnosis not present

## 2019-10-12 DIAGNOSIS — L851 Acquired keratosis [keratoderma] palmaris et plantaris: Secondary | ICD-10-CM | POA: Diagnosis not present

## 2019-10-12 DIAGNOSIS — F329 Major depressive disorder, single episode, unspecified: Secondary | ICD-10-CM | POA: Diagnosis not present

## 2019-10-12 DIAGNOSIS — I951 Orthostatic hypotension: Secondary | ICD-10-CM | POA: Diagnosis not present

## 2019-10-12 DIAGNOSIS — E1143 Type 2 diabetes mellitus with diabetic autonomic (poly)neuropathy: Secondary | ICD-10-CM | POA: Diagnosis not present

## 2019-10-12 DIAGNOSIS — E1142 Type 2 diabetes mellitus with diabetic polyneuropathy: Secondary | ICD-10-CM | POA: Diagnosis not present

## 2019-10-12 DIAGNOSIS — F17210 Nicotine dependence, cigarettes, uncomplicated: Secondary | ICD-10-CM | POA: Diagnosis not present

## 2019-10-12 DIAGNOSIS — I480 Paroxysmal atrial fibrillation: Secondary | ICD-10-CM | POA: Diagnosis not present

## 2019-10-12 DIAGNOSIS — I10 Essential (primary) hypertension: Secondary | ICD-10-CM | POA: Diagnosis not present

## 2019-10-12 DIAGNOSIS — M1711 Unilateral primary osteoarthritis, right knee: Secondary | ICD-10-CM | POA: Diagnosis not present

## 2019-10-12 DIAGNOSIS — E43 Unspecified severe protein-calorie malnutrition: Secondary | ICD-10-CM | POA: Diagnosis not present

## 2019-10-12 DIAGNOSIS — Z5181 Encounter for therapeutic drug level monitoring: Secondary | ICD-10-CM | POA: Diagnosis not present

## 2019-10-12 DIAGNOSIS — S72142D Displaced intertrochanteric fracture of left femur, subsequent encounter for closed fracture with routine healing: Secondary | ICD-10-CM | POA: Diagnosis not present

## 2019-10-12 DIAGNOSIS — D638 Anemia in other chronic diseases classified elsewhere: Secondary | ICD-10-CM | POA: Diagnosis not present

## 2019-10-12 DIAGNOSIS — K3184 Gastroparesis: Secondary | ICD-10-CM | POA: Diagnosis not present

## 2019-10-12 DIAGNOSIS — L97521 Non-pressure chronic ulcer of other part of left foot limited to breakdown of skin: Secondary | ICD-10-CM | POA: Diagnosis not present

## 2019-10-12 DIAGNOSIS — I350 Nonrheumatic aortic (valve) stenosis: Secondary | ICD-10-CM | POA: Diagnosis not present

## 2019-10-12 DIAGNOSIS — G894 Chronic pain syndrome: Secondary | ICD-10-CM | POA: Diagnosis not present

## 2019-10-12 DIAGNOSIS — J181 Lobar pneumonia, unspecified organism: Secondary | ICD-10-CM | POA: Diagnosis not present

## 2019-10-12 DIAGNOSIS — Z466 Encounter for fitting and adjustment of urinary device: Secondary | ICD-10-CM | POA: Diagnosis not present

## 2019-10-12 DIAGNOSIS — K2270 Barrett's esophagus without dysplasia: Secondary | ICD-10-CM | POA: Diagnosis not present

## 2019-10-12 DIAGNOSIS — E785 Hyperlipidemia, unspecified: Secondary | ICD-10-CM | POA: Diagnosis not present

## 2019-10-12 DIAGNOSIS — I5032 Chronic diastolic (congestive) heart failure: Secondary | ICD-10-CM | POA: Diagnosis not present

## 2019-10-12 DIAGNOSIS — R319 Hematuria, unspecified: Secondary | ICD-10-CM | POA: Diagnosis not present

## 2019-10-12 DIAGNOSIS — R338 Other retention of urine: Secondary | ICD-10-CM | POA: Diagnosis not present

## 2019-10-12 DIAGNOSIS — B351 Tinea unguium: Secondary | ICD-10-CM | POA: Diagnosis not present

## 2019-10-12 DIAGNOSIS — F0391 Unspecified dementia with behavioral disturbance: Secondary | ICD-10-CM | POA: Diagnosis not present

## 2019-10-12 DIAGNOSIS — F332 Major depressive disorder, recurrent severe without psychotic features: Secondary | ICD-10-CM | POA: Diagnosis not present

## 2019-10-12 DIAGNOSIS — M6281 Muscle weakness (generalized): Secondary | ICD-10-CM | POA: Diagnosis not present

## 2019-10-12 DIAGNOSIS — L89152 Pressure ulcer of sacral region, stage 2: Secondary | ICD-10-CM | POA: Diagnosis not present

## 2019-10-12 DIAGNOSIS — I714 Abdominal aortic aneurysm, without rupture: Secondary | ICD-10-CM | POA: Diagnosis not present

## 2019-10-12 DIAGNOSIS — R2681 Unsteadiness on feet: Secondary | ICD-10-CM | POA: Diagnosis not present

## 2019-10-12 DIAGNOSIS — K219 Gastro-esophageal reflux disease without esophagitis: Secondary | ICD-10-CM | POA: Diagnosis not present

## 2019-10-12 DIAGNOSIS — J449 Chronic obstructive pulmonary disease, unspecified: Secondary | ICD-10-CM | POA: Diagnosis not present

## 2019-10-12 DIAGNOSIS — F411 Generalized anxiety disorder: Secondary | ICD-10-CM | POA: Diagnosis not present

## 2019-10-12 DIAGNOSIS — N401 Enlarged prostate with lower urinary tract symptoms: Secondary | ICD-10-CM | POA: Diagnosis not present

## 2019-10-12 DIAGNOSIS — K769 Liver disease, unspecified: Secondary | ICD-10-CM | POA: Diagnosis not present

## 2019-10-12 DIAGNOSIS — E1100 Type 2 diabetes mellitus with hyperosmolarity without nonketotic hyperglycemic-hyperosmolar coma (NKHHC): Secondary | ICD-10-CM | POA: Diagnosis not present

## 2019-10-12 DIAGNOSIS — N390 Urinary tract infection, site not specified: Secondary | ICD-10-CM | POA: Diagnosis not present

## 2019-10-12 DIAGNOSIS — G4733 Obstructive sleep apnea (adult) (pediatric): Secondary | ICD-10-CM | POA: Diagnosis not present

## 2019-10-12 DIAGNOSIS — S81802A Unspecified open wound, left lower leg, initial encounter: Secondary | ICD-10-CM | POA: Diagnosis not present

## 2019-10-12 DIAGNOSIS — Z20828 Contact with and (suspected) exposure to other viral communicable diseases: Secondary | ICD-10-CM | POA: Diagnosis not present

## 2019-10-12 DIAGNOSIS — N939 Abnormal uterine and vaginal bleeding, unspecified: Secondary | ICD-10-CM | POA: Diagnosis not present

## 2019-10-12 DIAGNOSIS — I2510 Atherosclerotic heart disease of native coronary artery without angina pectoris: Secondary | ICD-10-CM | POA: Diagnosis not present

## 2019-10-12 DIAGNOSIS — Z79891 Long term (current) use of opiate analgesic: Secondary | ICD-10-CM | POA: Diagnosis not present

## 2019-10-12 DIAGNOSIS — Z9181 History of falling: Secondary | ICD-10-CM | POA: Diagnosis not present

## 2019-10-12 DIAGNOSIS — Z125 Encounter for screening for malignant neoplasm of prostate: Secondary | ICD-10-CM | POA: Diagnosis not present

## 2019-10-13 DIAGNOSIS — Z01818 Encounter for other preprocedural examination: Secondary | ICD-10-CM | POA: Diagnosis not present

## 2019-10-13 DIAGNOSIS — M25562 Pain in left knee: Secondary | ICD-10-CM | POA: Diagnosis not present

## 2019-10-13 DIAGNOSIS — I252 Old myocardial infarction: Secondary | ICD-10-CM | POA: Diagnosis not present

## 2019-10-13 DIAGNOSIS — S80211D Abrasion, right knee, subsequent encounter: Secondary | ICD-10-CM | POA: Diagnosis not present

## 2019-10-13 DIAGNOSIS — I5032 Chronic diastolic (congestive) heart failure: Secondary | ICD-10-CM | POA: Diagnosis not present

## 2019-10-13 DIAGNOSIS — Z01810 Encounter for preprocedural cardiovascular examination: Secondary | ICD-10-CM | POA: Diagnosis not present

## 2019-10-13 DIAGNOSIS — R269 Unspecified abnormalities of gait and mobility: Secondary | ICD-10-CM | POA: Diagnosis not present

## 2019-10-13 DIAGNOSIS — G20 Parkinson's disease: Secondary | ICD-10-CM | POA: Diagnosis not present

## 2019-10-13 DIAGNOSIS — F0280 Dementia in other diseases classified elsewhere without behavioral disturbance: Secondary | ICD-10-CM | POA: Diagnosis not present

## 2019-10-13 DIAGNOSIS — I2510 Atherosclerotic heart disease of native coronary artery without angina pectoris: Secondary | ICD-10-CM | POA: Diagnosis not present

## 2019-10-13 DIAGNOSIS — E559 Vitamin D deficiency, unspecified: Secondary | ICD-10-CM | POA: Diagnosis not present

## 2019-10-13 DIAGNOSIS — N183 Chronic kidney disease, stage 3 unspecified: Secondary | ICD-10-CM | POA: Diagnosis not present

## 2019-10-13 DIAGNOSIS — N179 Acute kidney failure, unspecified: Secondary | ICD-10-CM | POA: Diagnosis not present

## 2019-10-13 DIAGNOSIS — S72012D Unspecified intracapsular fracture of left femur, subsequent encounter for closed fracture with routine healing: Secondary | ICD-10-CM | POA: Diagnosis not present

## 2019-10-13 DIAGNOSIS — K5730 Diverticulosis of large intestine without perforation or abscess without bleeding: Secondary | ICD-10-CM | POA: Diagnosis not present

## 2019-10-13 DIAGNOSIS — I4819 Other persistent atrial fibrillation: Secondary | ICD-10-CM | POA: Diagnosis not present

## 2019-10-13 DIAGNOSIS — I1310 Hypertensive heart and chronic kidney disease without heart failure, with stage 1 through stage 4 chronic kidney disease, or unspecified chronic kidney disease: Secondary | ICD-10-CM | POA: Diagnosis not present

## 2019-10-13 DIAGNOSIS — Z961 Presence of intraocular lens: Secondary | ICD-10-CM | POA: Diagnosis not present

## 2019-10-13 DIAGNOSIS — R809 Proteinuria, unspecified: Secondary | ICD-10-CM | POA: Diagnosis not present

## 2019-10-13 DIAGNOSIS — Z79899 Other long term (current) drug therapy: Secondary | ICD-10-CM | POA: Diagnosis not present

## 2019-10-13 DIAGNOSIS — E1129 Type 2 diabetes mellitus with other diabetic kidney complication: Secondary | ICD-10-CM | POA: Diagnosis not present

## 2019-10-13 DIAGNOSIS — E782 Mixed hyperlipidemia: Secondary | ICD-10-CM | POA: Diagnosis not present

## 2019-10-13 DIAGNOSIS — I69354 Hemiplegia and hemiparesis following cerebral infarction affecting left non-dominant side: Secondary | ICD-10-CM | POA: Diagnosis not present

## 2019-10-13 DIAGNOSIS — M545 Low back pain: Secondary | ICD-10-CM | POA: Diagnosis not present

## 2019-10-13 DIAGNOSIS — G894 Chronic pain syndrome: Secondary | ICD-10-CM | POA: Diagnosis not present

## 2019-10-13 DIAGNOSIS — I10 Essential (primary) hypertension: Secondary | ICD-10-CM | POA: Diagnosis not present

## 2019-10-13 DIAGNOSIS — E1151 Type 2 diabetes mellitus with diabetic peripheral angiopathy without gangrene: Secondary | ICD-10-CM | POA: Diagnosis not present

## 2019-10-13 DIAGNOSIS — Z20828 Contact with and (suspected) exposure to other viral communicable diseases: Secondary | ICD-10-CM | POA: Diagnosis not present

## 2019-10-13 DIAGNOSIS — Z8546 Personal history of malignant neoplasm of prostate: Secondary | ICD-10-CM | POA: Diagnosis not present

## 2019-10-13 DIAGNOSIS — D3000 Benign neoplasm of unspecified kidney: Secondary | ICD-10-CM | POA: Diagnosis not present

## 2019-10-13 DIAGNOSIS — J449 Chronic obstructive pulmonary disease, unspecified: Secondary | ICD-10-CM | POA: Diagnosis not present

## 2019-10-13 DIAGNOSIS — I69398 Other sequelae of cerebral infarction: Secondary | ICD-10-CM | POA: Diagnosis not present

## 2019-10-13 DIAGNOSIS — A419 Sepsis, unspecified organism: Secondary | ICD-10-CM | POA: Diagnosis not present

## 2019-10-13 DIAGNOSIS — G300 Alzheimer's disease with early onset: Secondary | ICD-10-CM | POA: Diagnosis not present

## 2019-10-13 DIAGNOSIS — R54 Age-related physical debility: Secondary | ICD-10-CM | POA: Diagnosis not present

## 2019-10-13 DIAGNOSIS — M1712 Unilateral primary osteoarthritis, left knee: Secondary | ICD-10-CM | POA: Diagnosis not present

## 2019-10-13 DIAGNOSIS — E1122 Type 2 diabetes mellitus with diabetic chronic kidney disease: Secondary | ICD-10-CM | POA: Diagnosis not present

## 2019-10-13 DIAGNOSIS — F0281 Dementia in other diseases classified elsewhere with behavioral disturbance: Secondary | ICD-10-CM | POA: Diagnosis not present

## 2019-10-13 DIAGNOSIS — Z1159 Encounter for screening for other viral diseases: Secondary | ICD-10-CM | POA: Diagnosis not present

## 2019-10-13 DIAGNOSIS — J188 Other pneumonia, unspecified organism: Secondary | ICD-10-CM | POA: Diagnosis not present

## 2019-10-13 DIAGNOSIS — H903 Sensorineural hearing loss, bilateral: Secondary | ICD-10-CM | POA: Diagnosis not present

## 2019-10-13 DIAGNOSIS — E1142 Type 2 diabetes mellitus with diabetic polyneuropathy: Secondary | ICD-10-CM | POA: Diagnosis not present

## 2019-10-13 DIAGNOSIS — E1165 Type 2 diabetes mellitus with hyperglycemia: Secondary | ICD-10-CM | POA: Diagnosis not present

## 2019-10-14 DIAGNOSIS — E119 Type 2 diabetes mellitus without complications: Secondary | ICD-10-CM | POA: Diagnosis not present

## 2019-10-14 DIAGNOSIS — E7849 Other hyperlipidemia: Secondary | ICD-10-CM | POA: Diagnosis not present

## 2019-10-14 DIAGNOSIS — K219 Gastro-esophageal reflux disease without esophagitis: Secondary | ICD-10-CM | POA: Diagnosis not present

## 2019-10-14 DIAGNOSIS — M1990 Unspecified osteoarthritis, unspecified site: Secondary | ICD-10-CM | POA: Diagnosis not present

## 2019-10-14 DIAGNOSIS — M1030 Gout due to renal impairment, unspecified site: Secondary | ICD-10-CM | POA: Diagnosis not present

## 2019-10-14 DIAGNOSIS — G4730 Sleep apnea, unspecified: Secondary | ICD-10-CM | POA: Diagnosis not present

## 2019-10-14 DIAGNOSIS — Z1211 Encounter for screening for malignant neoplasm of colon: Secondary | ICD-10-CM | POA: Diagnosis not present

## 2019-10-14 DIAGNOSIS — I10 Essential (primary) hypertension: Secondary | ICD-10-CM | POA: Diagnosis not present

## 2019-10-14 DIAGNOSIS — J42 Unspecified chronic bronchitis: Secondary | ICD-10-CM | POA: Diagnosis not present

## 2019-10-14 DIAGNOSIS — K5732 Diverticulitis of large intestine without perforation or abscess without bleeding: Secondary | ICD-10-CM | POA: Diagnosis not present

## 2019-10-14 DIAGNOSIS — D509 Iron deficiency anemia, unspecified: Secondary | ICD-10-CM | POA: Diagnosis not present

## 2019-10-14 DIAGNOSIS — T8131XA Disruption of external operation (surgical) wound, not elsewhere classified, initial encounter: Secondary | ICD-10-CM | POA: Diagnosis not present

## 2019-10-14 DIAGNOSIS — I129 Hypertensive chronic kidney disease with stage 1 through stage 4 chronic kidney disease, or unspecified chronic kidney disease: Secondary | ICD-10-CM | POA: Diagnosis not present

## 2019-10-14 DIAGNOSIS — I4821 Permanent atrial fibrillation: Secondary | ICD-10-CM | POA: Diagnosis not present

## 2019-10-14 DIAGNOSIS — E1142 Type 2 diabetes mellitus with diabetic polyneuropathy: Secondary | ICD-10-CM | POA: Diagnosis not present

## 2019-10-14 DIAGNOSIS — I890 Lymphedema, not elsewhere classified: Secondary | ICD-10-CM | POA: Diagnosis not present

## 2019-10-14 DIAGNOSIS — L97211 Non-pressure chronic ulcer of right calf limited to breakdown of skin: Secondary | ICD-10-CM | POA: Diagnosis not present

## 2019-10-14 DIAGNOSIS — I83012 Varicose veins of right lower extremity with ulcer of calf: Secondary | ICD-10-CM | POA: Diagnosis not present

## 2019-10-14 DIAGNOSIS — K5901 Slow transit constipation: Secondary | ICD-10-CM | POA: Diagnosis not present

## 2019-10-14 DIAGNOSIS — E785 Hyperlipidemia, unspecified: Secondary | ICD-10-CM | POA: Diagnosis not present

## 2019-10-14 DIAGNOSIS — I872 Venous insufficiency (chronic) (peripheral): Secondary | ICD-10-CM | POA: Diagnosis not present

## 2019-10-14 DIAGNOSIS — N189 Chronic kidney disease, unspecified: Secondary | ICD-10-CM | POA: Diagnosis not present

## 2019-10-14 DIAGNOSIS — M109 Gout, unspecified: Secondary | ICD-10-CM | POA: Diagnosis not present

## 2019-10-14 DIAGNOSIS — E1151 Type 2 diabetes mellitus with diabetic peripheral angiopathy without gangrene: Secondary | ICD-10-CM | POA: Diagnosis not present

## 2019-10-14 DIAGNOSIS — E1122 Type 2 diabetes mellitus with diabetic chronic kidney disease: Secondary | ICD-10-CM | POA: Diagnosis not present

## 2019-10-14 DIAGNOSIS — G43909 Migraine, unspecified, not intractable, without status migrainosus: Secondary | ICD-10-CM | POA: Diagnosis not present

## 2019-10-15 DIAGNOSIS — M961 Postlaminectomy syndrome, not elsewhere classified: Secondary | ICD-10-CM | POA: Diagnosis not present

## 2019-10-15 DIAGNOSIS — S12500D Unspecified displaced fracture of sixth cervical vertebra, subsequent encounter for fracture with routine healing: Secondary | ICD-10-CM | POA: Diagnosis not present

## 2019-10-15 DIAGNOSIS — S12600D Unspecified displaced fracture of seventh cervical vertebra, subsequent encounter for fracture with routine healing: Secondary | ICD-10-CM | POA: Diagnosis not present

## 2019-10-15 DIAGNOSIS — M47812 Spondylosis without myelopathy or radiculopathy, cervical region: Secondary | ICD-10-CM | POA: Diagnosis not present

## 2019-10-15 DIAGNOSIS — M542 Cervicalgia: Secondary | ICD-10-CM | POA: Diagnosis not present

## 2019-10-15 DIAGNOSIS — F331 Major depressive disorder, recurrent, moderate: Secondary | ICD-10-CM | POA: Diagnosis not present

## 2019-10-15 DIAGNOSIS — M47817 Spondylosis without myelopathy or radiculopathy, lumbosacral region: Secondary | ICD-10-CM | POA: Diagnosis not present

## 2019-10-15 DIAGNOSIS — I350 Nonrheumatic aortic (valve) stenosis: Secondary | ICD-10-CM | POA: Diagnosis not present

## 2019-10-15 DIAGNOSIS — Z20828 Contact with and (suspected) exposure to other viral communicable diseases: Secondary | ICD-10-CM | POA: Diagnosis not present

## 2019-10-15 DIAGNOSIS — F419 Anxiety disorder, unspecified: Secondary | ICD-10-CM | POA: Diagnosis not present

## 2019-10-15 DIAGNOSIS — M8580 Other specified disorders of bone density and structure, unspecified site: Secondary | ICD-10-CM | POA: Diagnosis not present

## 2019-10-15 DIAGNOSIS — R945 Abnormal results of liver function studies: Secondary | ICD-10-CM | POA: Diagnosis not present

## 2019-10-15 DIAGNOSIS — S2232XD Fracture of one rib, left side, subsequent encounter for fracture with routine healing: Secondary | ICD-10-CM | POA: Diagnosis not present

## 2019-10-15 DIAGNOSIS — J690 Pneumonitis due to inhalation of food and vomit: Secondary | ICD-10-CM | POA: Diagnosis not present

## 2019-10-15 DIAGNOSIS — I429 Cardiomyopathy, unspecified: Secondary | ICD-10-CM | POA: Diagnosis not present

## 2019-10-15 DIAGNOSIS — D649 Anemia, unspecified: Secondary | ICD-10-CM | POA: Diagnosis not present

## 2019-10-15 DIAGNOSIS — M4854XD Collapsed vertebra, not elsewhere classified, thoracic region, subsequent encounter for fracture with routine healing: Secondary | ICD-10-CM | POA: Diagnosis not present

## 2019-10-15 DIAGNOSIS — Z952 Presence of prosthetic heart valve: Secondary | ICD-10-CM | POA: Diagnosis not present

## 2019-10-15 DIAGNOSIS — E782 Mixed hyperlipidemia: Secondary | ICD-10-CM | POA: Diagnosis not present

## 2019-10-15 DIAGNOSIS — S15102D Unspecified injury of left vertebral artery, subsequent encounter: Secondary | ICD-10-CM | POA: Diagnosis not present

## 2019-10-16 DIAGNOSIS — Z1211 Encounter for screening for malignant neoplasm of colon: Secondary | ICD-10-CM | POA: Diagnosis not present

## 2019-10-16 DIAGNOSIS — Z20828 Contact with and (suspected) exposure to other viral communicable diseases: Secondary | ICD-10-CM | POA: Diagnosis not present

## 2019-10-17 DIAGNOSIS — E785 Hyperlipidemia, unspecified: Secondary | ICD-10-CM | POA: Diagnosis not present

## 2019-10-17 DIAGNOSIS — I890 Lymphedema, not elsewhere classified: Secondary | ICD-10-CM | POA: Diagnosis not present

## 2019-10-17 DIAGNOSIS — C3492 Malignant neoplasm of unspecified part of left bronchus or lung: Secondary | ICD-10-CM | POA: Diagnosis not present

## 2019-10-17 DIAGNOSIS — R609 Edema, unspecified: Secondary | ICD-10-CM | POA: Diagnosis not present

## 2019-10-17 DIAGNOSIS — N1831 Chronic kidney disease, stage 3a: Secondary | ICD-10-CM | POA: Diagnosis not present

## 2019-10-17 DIAGNOSIS — I129 Hypertensive chronic kidney disease with stage 1 through stage 4 chronic kidney disease, or unspecified chronic kidney disease: Secondary | ICD-10-CM | POA: Diagnosis not present

## 2019-10-17 DIAGNOSIS — Z20828 Contact with and (suspected) exposure to other viral communicable diseases: Secondary | ICD-10-CM | POA: Diagnosis not present

## 2019-10-17 DIAGNOSIS — R530 Neoplastic (malignant) related fatigue: Secondary | ICD-10-CM | POA: Diagnosis not present

## 2019-10-17 DIAGNOSIS — R4182 Altered mental status, unspecified: Secondary | ICD-10-CM | POA: Diagnosis not present

## 2019-10-17 DIAGNOSIS — Z87891 Personal history of nicotine dependence: Secondary | ICD-10-CM | POA: Diagnosis not present

## 2019-10-17 DIAGNOSIS — K589 Irritable bowel syndrome without diarrhea: Secondary | ICD-10-CM | POA: Diagnosis not present

## 2019-10-17 DIAGNOSIS — Z7901 Long term (current) use of anticoagulants: Secondary | ICD-10-CM | POA: Diagnosis not present

## 2019-10-17 DIAGNOSIS — E039 Hypothyroidism, unspecified: Secondary | ICD-10-CM | POA: Diagnosis not present

## 2019-10-17 DIAGNOSIS — Z79899 Other long term (current) drug therapy: Secondary | ICD-10-CM | POA: Diagnosis not present

## 2019-10-17 DIAGNOSIS — M109 Gout, unspecified: Secondary | ICD-10-CM | POA: Diagnosis not present

## 2019-10-17 DIAGNOSIS — L439 Lichen planus, unspecified: Secondary | ICD-10-CM | POA: Diagnosis not present

## 2019-10-17 DIAGNOSIS — S22080S Wedge compression fracture of T11-T12 vertebra, sequela: Secondary | ICD-10-CM | POA: Diagnosis not present

## 2019-10-17 DIAGNOSIS — R55 Syncope and collapse: Secondary | ICD-10-CM | POA: Diagnosis not present

## 2019-10-17 DIAGNOSIS — F0390 Unspecified dementia without behavioral disturbance: Secondary | ICD-10-CM | POA: Diagnosis not present

## 2019-10-17 DIAGNOSIS — K219 Gastro-esophageal reflux disease without esophagitis: Secondary | ICD-10-CM | POA: Diagnosis not present

## 2019-10-17 DIAGNOSIS — F329 Major depressive disorder, single episode, unspecified: Secondary | ICD-10-CM | POA: Diagnosis not present

## 2019-10-17 DIAGNOSIS — R001 Bradycardia, unspecified: Secondary | ICD-10-CM | POA: Diagnosis not present

## 2019-10-17 DIAGNOSIS — R0789 Other chest pain: Secondary | ICD-10-CM | POA: Diagnosis not present

## 2019-10-17 DIAGNOSIS — I2510 Atherosclerotic heart disease of native coronary artery without angina pectoris: Secondary | ICD-10-CM | POA: Diagnosis not present

## 2019-10-17 DIAGNOSIS — I4891 Unspecified atrial fibrillation: Secondary | ICD-10-CM | POA: Diagnosis not present

## 2019-10-17 DIAGNOSIS — I340 Nonrheumatic mitral (valve) insufficiency: Secondary | ICD-10-CM | POA: Diagnosis not present

## 2019-10-17 DIAGNOSIS — I700 Atherosclerosis of aorta: Secondary | ICD-10-CM | POA: Diagnosis not present

## 2019-10-17 DIAGNOSIS — M79606 Pain in leg, unspecified: Secondary | ICD-10-CM | POA: Diagnosis not present

## 2019-10-17 DIAGNOSIS — I82402 Acute embolism and thrombosis of unspecified deep veins of left lower extremity: Secondary | ICD-10-CM | POA: Diagnosis not present

## 2019-10-17 DIAGNOSIS — M1990 Unspecified osteoarthritis, unspecified site: Secondary | ICD-10-CM | POA: Diagnosis not present

## 2019-10-17 DIAGNOSIS — I872 Venous insufficiency (chronic) (peripheral): Secondary | ICD-10-CM | POA: Diagnosis not present

## 2019-10-17 DIAGNOSIS — N3592 Unspecified urethral stricture, female: Secondary | ICD-10-CM | POA: Diagnosis not present

## 2019-10-18 DIAGNOSIS — F0390 Unspecified dementia without behavioral disturbance: Secondary | ICD-10-CM | POA: Diagnosis not present

## 2019-10-18 DIAGNOSIS — R319 Hematuria, unspecified: Secondary | ICD-10-CM | POA: Diagnosis not present

## 2019-10-18 DIAGNOSIS — N390 Urinary tract infection, site not specified: Secondary | ICD-10-CM | POA: Diagnosis not present

## 2019-10-18 DIAGNOSIS — F329 Major depressive disorder, single episode, unspecified: Secondary | ICD-10-CM | POA: Diagnosis not present

## 2019-10-18 DIAGNOSIS — E7800 Pure hypercholesterolemia, unspecified: Secondary | ICD-10-CM | POA: Diagnosis not present

## 2019-10-18 DIAGNOSIS — Z941 Heart transplant status: Secondary | ICD-10-CM | POA: Diagnosis not present

## 2019-10-18 DIAGNOSIS — I129 Hypertensive chronic kidney disease with stage 1 through stage 4 chronic kidney disease, or unspecified chronic kidney disease: Secondary | ICD-10-CM | POA: Diagnosis not present

## 2019-10-18 DIAGNOSIS — N189 Chronic kidney disease, unspecified: Secondary | ICD-10-CM | POA: Diagnosis not present

## 2019-10-18 DIAGNOSIS — N3592 Unspecified urethral stricture, female: Secondary | ICD-10-CM | POA: Diagnosis not present

## 2019-10-18 DIAGNOSIS — I2699 Other pulmonary embolism without acute cor pulmonale: Secondary | ICD-10-CM | POA: Diagnosis not present

## 2019-10-18 DIAGNOSIS — R7881 Bacteremia: Secondary | ICD-10-CM | POA: Diagnosis not present

## 2019-10-18 DIAGNOSIS — E039 Hypothyroidism, unspecified: Secondary | ICD-10-CM | POA: Diagnosis not present

## 2019-10-18 DIAGNOSIS — R001 Bradycardia, unspecified: Secondary | ICD-10-CM | POA: Diagnosis not present

## 2019-10-18 DIAGNOSIS — L439 Lichen planus, unspecified: Secondary | ICD-10-CM | POA: Diagnosis not present

## 2019-10-18 DIAGNOSIS — R55 Syncope and collapse: Secondary | ICD-10-CM | POA: Diagnosis not present

## 2019-10-18 DIAGNOSIS — Z7901 Long term (current) use of anticoagulants: Secondary | ICD-10-CM | POA: Diagnosis not present

## 2019-10-18 DIAGNOSIS — I9589 Other hypotension: Secondary | ICD-10-CM | POA: Diagnosis not present

## 2019-10-18 DIAGNOSIS — D649 Anemia, unspecified: Secondary | ICD-10-CM | POA: Diagnosis not present

## 2019-10-18 DIAGNOSIS — M6281 Muscle weakness (generalized): Secondary | ICD-10-CM | POA: Diagnosis not present

## 2019-10-18 DIAGNOSIS — E119 Type 2 diabetes mellitus without complications: Secondary | ICD-10-CM | POA: Diagnosis not present

## 2019-10-18 DIAGNOSIS — E782 Mixed hyperlipidemia: Secondary | ICD-10-CM | POA: Diagnosis not present

## 2019-10-18 DIAGNOSIS — Z20828 Contact with and (suspected) exposure to other viral communicable diseases: Secondary | ICD-10-CM | POA: Diagnosis not present

## 2019-10-18 DIAGNOSIS — N1831 Chronic kidney disease, stage 3a: Secondary | ICD-10-CM | POA: Diagnosis not present

## 2019-10-18 DIAGNOSIS — R451 Restlessness and agitation: Secondary | ICD-10-CM | POA: Diagnosis not present

## 2019-10-18 DIAGNOSIS — E559 Vitamin D deficiency, unspecified: Secondary | ICD-10-CM | POA: Diagnosis not present

## 2019-10-18 DIAGNOSIS — E860 Dehydration: Secondary | ICD-10-CM | POA: Diagnosis not present

## 2019-10-18 DIAGNOSIS — E785 Hyperlipidemia, unspecified: Secondary | ICD-10-CM | POA: Diagnosis not present

## 2019-10-18 DIAGNOSIS — R2681 Unsteadiness on feet: Secondary | ICD-10-CM | POA: Diagnosis not present

## 2019-10-18 DIAGNOSIS — D518 Other vitamin B12 deficiency anemias: Secondary | ICD-10-CM | POA: Diagnosis not present

## 2019-10-18 DIAGNOSIS — I872 Venous insufficiency (chronic) (peripheral): Secondary | ICD-10-CM | POA: Diagnosis not present

## 2019-10-18 DIAGNOSIS — R300 Dysuria: Secondary | ICD-10-CM | POA: Diagnosis not present

## 2019-10-19 DIAGNOSIS — S72142D Displaced intertrochanteric fracture of left femur, subsequent encounter for closed fracture with routine healing: Secondary | ICD-10-CM | POA: Diagnosis not present

## 2019-10-19 DIAGNOSIS — Z5181 Encounter for therapeutic drug level monitoring: Secondary | ICD-10-CM | POA: Diagnosis not present

## 2019-10-19 DIAGNOSIS — R1310 Dysphagia, unspecified: Secondary | ICD-10-CM | POA: Diagnosis not present

## 2019-10-19 DIAGNOSIS — K5730 Diverticulosis of large intestine without perforation or abscess without bleeding: Secondary | ICD-10-CM | POA: Diagnosis not present

## 2019-10-19 DIAGNOSIS — M869 Osteomyelitis, unspecified: Secondary | ICD-10-CM | POA: Diagnosis not present

## 2019-10-19 DIAGNOSIS — I110 Hypertensive heart disease with heart failure: Secondary | ICD-10-CM | POA: Diagnosis not present

## 2019-10-19 DIAGNOSIS — I482 Chronic atrial fibrillation, unspecified: Secondary | ICD-10-CM | POA: Diagnosis not present

## 2019-10-19 DIAGNOSIS — E1142 Type 2 diabetes mellitus with diabetic polyneuropathy: Secondary | ICD-10-CM | POA: Diagnosis not present

## 2019-10-19 DIAGNOSIS — Z79899 Other long term (current) drug therapy: Secondary | ICD-10-CM | POA: Diagnosis not present

## 2019-10-19 DIAGNOSIS — I69098 Other sequelae following nontraumatic subarachnoid hemorrhage: Secondary | ICD-10-CM | POA: Diagnosis not present

## 2019-10-19 DIAGNOSIS — E43 Unspecified severe protein-calorie malnutrition: Secondary | ICD-10-CM | POA: Diagnosis not present

## 2019-10-19 DIAGNOSIS — I132 Hypertensive heart and chronic kidney disease with heart failure and with stage 5 chronic kidney disease, or end stage renal disease: Secondary | ICD-10-CM | POA: Diagnosis not present

## 2019-10-19 DIAGNOSIS — N179 Acute kidney failure, unspecified: Secondary | ICD-10-CM | POA: Diagnosis not present

## 2019-10-19 DIAGNOSIS — I69018 Other symptoms and signs involving cognitive functions following nontraumatic subarachnoid hemorrhage: Secondary | ICD-10-CM | POA: Diagnosis not present

## 2019-10-19 DIAGNOSIS — R21 Rash and other nonspecific skin eruption: Secondary | ICD-10-CM | POA: Diagnosis not present

## 2019-10-19 DIAGNOSIS — M1380 Other specified arthritis, unspecified site: Secondary | ICD-10-CM | POA: Diagnosis not present

## 2019-10-19 DIAGNOSIS — N186 End stage renal disease: Secondary | ICD-10-CM | POA: Diagnosis not present

## 2019-10-19 DIAGNOSIS — N401 Enlarged prostate with lower urinary tract symptoms: Secondary | ICD-10-CM | POA: Diagnosis not present

## 2019-10-19 DIAGNOSIS — E7800 Pure hypercholesterolemia, unspecified: Secondary | ICD-10-CM | POA: Diagnosis not present

## 2019-10-19 DIAGNOSIS — D62 Acute posthemorrhagic anemia: Secondary | ICD-10-CM | POA: Diagnosis not present

## 2019-10-19 DIAGNOSIS — K388 Other specified diseases of appendix: Secondary | ICD-10-CM | POA: Diagnosis not present

## 2019-10-19 DIAGNOSIS — G629 Polyneuropathy, unspecified: Secondary | ICD-10-CM | POA: Diagnosis not present

## 2019-10-19 DIAGNOSIS — N1831 Chronic kidney disease, stage 3a: Secondary | ICD-10-CM | POA: Diagnosis not present

## 2019-10-19 DIAGNOSIS — M109 Gout, unspecified: Secondary | ICD-10-CM | POA: Diagnosis not present

## 2019-10-19 DIAGNOSIS — D1803 Hemangioma of intra-abdominal structures: Secondary | ICD-10-CM | POA: Diagnosis not present

## 2019-10-19 DIAGNOSIS — S72012D Unspecified intracapsular fracture of left femur, subsequent encounter for closed fracture with routine healing: Secondary | ICD-10-CM | POA: Diagnosis not present

## 2019-10-19 DIAGNOSIS — R7303 Prediabetes: Secondary | ICD-10-CM | POA: Diagnosis not present

## 2019-10-19 DIAGNOSIS — N390 Urinary tract infection, site not specified: Secondary | ICD-10-CM | POA: Diagnosis not present

## 2019-10-19 DIAGNOSIS — D649 Anemia, unspecified: Secondary | ICD-10-CM | POA: Diagnosis not present

## 2019-10-19 DIAGNOSIS — A419 Sepsis, unspecified organism: Secondary | ICD-10-CM | POA: Diagnosis not present

## 2019-10-19 DIAGNOSIS — L89152 Pressure ulcer of sacral region, stage 2: Secondary | ICD-10-CM | POA: Diagnosis not present

## 2019-10-19 DIAGNOSIS — J9610 Chronic respiratory failure, unspecified whether with hypoxia or hypercapnia: Secondary | ICD-10-CM | POA: Diagnosis not present

## 2019-10-19 DIAGNOSIS — I69351 Hemiplegia and hemiparesis following cerebral infarction affecting right dominant side: Secondary | ICD-10-CM | POA: Diagnosis not present

## 2019-10-19 DIAGNOSIS — I5033 Acute on chronic diastolic (congestive) heart failure: Secondary | ICD-10-CM | POA: Diagnosis not present

## 2019-10-19 DIAGNOSIS — H6691 Otitis media, unspecified, right ear: Secondary | ICD-10-CM | POA: Diagnosis not present

## 2019-10-19 DIAGNOSIS — Z952 Presence of prosthetic heart valve: Secondary | ICD-10-CM | POA: Diagnosis not present

## 2019-10-19 DIAGNOSIS — K279 Peptic ulcer, site unspecified, unspecified as acute or chronic, without hemorrhage or perforation: Secondary | ICD-10-CM | POA: Diagnosis not present

## 2019-10-19 DIAGNOSIS — G40824 Epileptic spasms, intractable, without status epilepticus: Secondary | ICD-10-CM | POA: Diagnosis not present

## 2019-10-19 DIAGNOSIS — E663 Overweight: Secondary | ICD-10-CM | POA: Diagnosis not present

## 2019-10-19 DIAGNOSIS — Z9012 Acquired absence of left breast and nipple: Secondary | ICD-10-CM | POA: Diagnosis not present

## 2019-10-19 DIAGNOSIS — R531 Weakness: Secondary | ICD-10-CM | POA: Diagnosis not present

## 2019-10-19 DIAGNOSIS — J302 Other seasonal allergic rhinitis: Secondary | ICD-10-CM | POA: Diagnosis not present

## 2019-10-19 DIAGNOSIS — M810 Age-related osteoporosis without current pathological fracture: Secondary | ICD-10-CM | POA: Diagnosis not present

## 2019-10-19 DIAGNOSIS — Z794 Long term (current) use of insulin: Secondary | ICD-10-CM | POA: Diagnosis not present

## 2019-10-19 DIAGNOSIS — R339 Retention of urine, unspecified: Secondary | ICD-10-CM | POA: Diagnosis not present

## 2019-10-19 DIAGNOSIS — L03115 Cellulitis of right lower limb: Secondary | ICD-10-CM | POA: Diagnosis not present

## 2019-10-19 DIAGNOSIS — E8809 Other disorders of plasma-protein metabolism, not elsewhere classified: Secondary | ICD-10-CM | POA: Diagnosis not present

## 2019-10-19 DIAGNOSIS — F064 Anxiety disorder due to known physiological condition: Secondary | ICD-10-CM | POA: Diagnosis not present

## 2019-10-19 DIAGNOSIS — Z853 Personal history of malignant neoplasm of breast: Secondary | ICD-10-CM | POA: Diagnosis not present

## 2019-10-19 DIAGNOSIS — C189 Malignant neoplasm of colon, unspecified: Secondary | ICD-10-CM | POA: Diagnosis not present

## 2019-10-19 DIAGNOSIS — I714 Abdominal aortic aneurysm, without rupture: Secondary | ICD-10-CM | POA: Diagnosis not present

## 2019-10-19 DIAGNOSIS — Z955 Presence of coronary angioplasty implant and graft: Secondary | ICD-10-CM | POA: Diagnosis not present

## 2019-10-19 DIAGNOSIS — J189 Pneumonia, unspecified organism: Secondary | ICD-10-CM | POA: Diagnosis not present

## 2019-10-19 DIAGNOSIS — K5901 Slow transit constipation: Secondary | ICD-10-CM | POA: Diagnosis not present

## 2019-10-19 DIAGNOSIS — K5790 Diverticulosis of intestine, part unspecified, without perforation or abscess without bleeding: Secondary | ICD-10-CM | POA: Diagnosis not present

## 2019-10-19 DIAGNOSIS — Z741 Need for assistance with personal care: Secondary | ICD-10-CM | POA: Diagnosis not present

## 2019-10-19 DIAGNOSIS — Z7952 Long term (current) use of systemic steroids: Secondary | ICD-10-CM | POA: Diagnosis not present

## 2019-10-19 DIAGNOSIS — I429 Cardiomyopathy, unspecified: Secondary | ICD-10-CM | POA: Diagnosis not present

## 2019-10-19 DIAGNOSIS — M7542 Impingement syndrome of left shoulder: Secondary | ICD-10-CM | POA: Diagnosis not present

## 2019-10-19 DIAGNOSIS — G4700 Insomnia, unspecified: Secondary | ICD-10-CM | POA: Diagnosis not present

## 2019-10-19 DIAGNOSIS — M1030 Gout due to renal impairment, unspecified site: Secondary | ICD-10-CM | POA: Diagnosis not present

## 2019-10-19 DIAGNOSIS — I82402 Acute embolism and thrombosis of unspecified deep veins of left lower extremity: Secondary | ICD-10-CM | POA: Diagnosis not present

## 2019-10-19 DIAGNOSIS — K5732 Diverticulitis of large intestine without perforation or abscess without bleeding: Secondary | ICD-10-CM | POA: Diagnosis not present

## 2019-10-19 DIAGNOSIS — I471 Supraventricular tachycardia: Secondary | ICD-10-CM | POA: Diagnosis not present

## 2019-10-19 DIAGNOSIS — E782 Mixed hyperlipidemia: Secondary | ICD-10-CM | POA: Diagnosis not present

## 2019-10-19 DIAGNOSIS — M47812 Spondylosis without myelopathy or radiculopathy, cervical region: Secondary | ICD-10-CM | POA: Diagnosis not present

## 2019-10-19 DIAGNOSIS — Z9181 History of falling: Secondary | ICD-10-CM | POA: Diagnosis not present

## 2019-10-19 DIAGNOSIS — L97829 Non-pressure chronic ulcer of other part of left lower leg with unspecified severity: Secondary | ICD-10-CM | POA: Diagnosis not present

## 2019-10-19 DIAGNOSIS — I252 Old myocardial infarction: Secondary | ICD-10-CM | POA: Diagnosis not present

## 2019-10-19 DIAGNOSIS — I890 Lymphedema, not elsewhere classified: Secondary | ICD-10-CM | POA: Diagnosis not present

## 2019-10-19 DIAGNOSIS — R338 Other retention of urine: Secondary | ICD-10-CM | POA: Diagnosis not present

## 2019-10-19 DIAGNOSIS — I509 Heart failure, unspecified: Secondary | ICD-10-CM | POA: Diagnosis not present

## 2019-10-19 DIAGNOSIS — I69041 Monoplegia of lower limb following nontraumatic subarachnoid hemorrhage affecting right dominant side: Secondary | ICD-10-CM | POA: Diagnosis not present

## 2019-10-19 DIAGNOSIS — Z7982 Long term (current) use of aspirin: Secondary | ICD-10-CM | POA: Diagnosis not present

## 2019-10-19 DIAGNOSIS — F0391 Unspecified dementia with behavioral disturbance: Secondary | ICD-10-CM | POA: Diagnosis not present

## 2019-10-19 DIAGNOSIS — G3184 Mild cognitive impairment, so stated: Secondary | ICD-10-CM | POA: Diagnosis not present

## 2019-10-19 DIAGNOSIS — S42302D Unspecified fracture of shaft of humerus, left arm, subsequent encounter for fracture with routine healing: Secondary | ICD-10-CM | POA: Diagnosis not present

## 2019-10-19 DIAGNOSIS — E876 Hypokalemia: Secondary | ICD-10-CM | POA: Diagnosis not present

## 2019-10-19 DIAGNOSIS — I1310 Hypertensive heart and chronic kidney disease without heart failure, with stage 1 through stage 4 chronic kidney disease, or unspecified chronic kidney disease: Secondary | ICD-10-CM | POA: Diagnosis not present

## 2019-10-19 DIAGNOSIS — D72823 Leukemoid reaction: Secondary | ICD-10-CM | POA: Diagnosis not present

## 2019-10-19 DIAGNOSIS — I83012 Varicose veins of right lower extremity with ulcer of calf: Secondary | ICD-10-CM | POA: Diagnosis not present

## 2019-10-19 DIAGNOSIS — M7582 Other shoulder lesions, left shoulder: Secondary | ICD-10-CM | POA: Diagnosis not present

## 2019-10-19 DIAGNOSIS — G300 Alzheimer's disease with early onset: Secondary | ICD-10-CM | POA: Diagnosis not present

## 2019-10-19 DIAGNOSIS — M4800 Spinal stenosis, site unspecified: Secondary | ICD-10-CM | POA: Diagnosis not present

## 2019-10-19 DIAGNOSIS — I10 Essential (primary) hypertension: Secondary | ICD-10-CM | POA: Diagnosis not present

## 2019-10-19 DIAGNOSIS — E7849 Other hyperlipidemia: Secondary | ICD-10-CM | POA: Diagnosis not present

## 2019-10-19 DIAGNOSIS — F419 Anxiety disorder, unspecified: Secondary | ICD-10-CM | POA: Diagnosis not present

## 2019-10-19 DIAGNOSIS — N8111 Cystocele, midline: Secondary | ICD-10-CM | POA: Diagnosis not present

## 2019-10-19 DIAGNOSIS — T603X4S Toxic effect of herbicides and fungicides, undetermined, sequela: Secondary | ICD-10-CM | POA: Diagnosis not present

## 2019-10-19 DIAGNOSIS — M6281 Muscle weakness (generalized): Secondary | ICD-10-CM | POA: Diagnosis not present

## 2019-10-19 DIAGNOSIS — Z1159 Encounter for screening for other viral diseases: Secondary | ICD-10-CM | POA: Diagnosis not present

## 2019-10-19 DIAGNOSIS — I4821 Permanent atrial fibrillation: Secondary | ICD-10-CM | POA: Diagnosis not present

## 2019-10-19 DIAGNOSIS — N3090 Cystitis, unspecified without hematuria: Secondary | ICD-10-CM | POA: Diagnosis not present

## 2019-10-19 DIAGNOSIS — I69398 Other sequelae of cerebral infarction: Secondary | ICD-10-CM | POA: Diagnosis not present

## 2019-10-19 DIAGNOSIS — G43909 Migraine, unspecified, not intractable, without status migrainosus: Secondary | ICD-10-CM | POA: Diagnosis not present

## 2019-10-19 DIAGNOSIS — I872 Venous insufficiency (chronic) (peripheral): Secondary | ICD-10-CM | POA: Diagnosis not present

## 2019-10-19 DIAGNOSIS — E1169 Type 2 diabetes mellitus with other specified complication: Secondary | ICD-10-CM | POA: Diagnosis not present

## 2019-10-19 DIAGNOSIS — Z7984 Long term (current) use of oral hypoglycemic drugs: Secondary | ICD-10-CM | POA: Diagnosis not present

## 2019-10-19 DIAGNOSIS — Z7951 Long term (current) use of inhaled steroids: Secondary | ICD-10-CM | POA: Diagnosis not present

## 2019-10-19 DIAGNOSIS — G250 Essential tremor: Secondary | ICD-10-CM | POA: Diagnosis not present

## 2019-10-19 DIAGNOSIS — L97211 Non-pressure chronic ulcer of right calf limited to breakdown of skin: Secondary | ICD-10-CM | POA: Diagnosis not present

## 2019-10-19 DIAGNOSIS — M8580 Other specified disorders of bone density and structure, unspecified site: Secondary | ICD-10-CM | POA: Diagnosis not present

## 2019-10-19 DIAGNOSIS — S12600D Unspecified displaced fracture of seventh cervical vertebra, subsequent encounter for fracture with routine healing: Secondary | ICD-10-CM | POA: Diagnosis not present

## 2019-10-19 DIAGNOSIS — E1151 Type 2 diabetes mellitus with diabetic peripheral angiopathy without gangrene: Secondary | ICD-10-CM | POA: Diagnosis not present

## 2019-10-19 DIAGNOSIS — E569 Vitamin deficiency, unspecified: Secondary | ICD-10-CM | POA: Diagnosis not present

## 2019-10-19 DIAGNOSIS — I5032 Chronic diastolic (congestive) heart failure: Secondary | ICD-10-CM | POA: Diagnosis not present

## 2019-10-19 DIAGNOSIS — D509 Iron deficiency anemia, unspecified: Secondary | ICD-10-CM | POA: Diagnosis not present

## 2019-10-19 DIAGNOSIS — F329 Major depressive disorder, single episode, unspecified: Secondary | ICD-10-CM | POA: Diagnosis not present

## 2019-10-19 DIAGNOSIS — Z87440 Personal history of urinary (tract) infections: Secondary | ICD-10-CM | POA: Diagnosis not present

## 2019-10-19 DIAGNOSIS — F0280 Dementia in other diseases classified elsewhere without behavioral disturbance: Secondary | ICD-10-CM | POA: Diagnosis not present

## 2019-10-19 DIAGNOSIS — M7061 Trochanteric bursitis, right hip: Secondary | ICD-10-CM | POA: Diagnosis not present

## 2019-10-19 DIAGNOSIS — M4802 Spinal stenosis, cervical region: Secondary | ICD-10-CM | POA: Diagnosis not present

## 2019-10-19 DIAGNOSIS — M71112 Other infective bursitis, left shoulder: Secondary | ICD-10-CM | POA: Diagnosis not present

## 2019-10-19 DIAGNOSIS — H548 Legal blindness, as defined in USA: Secondary | ICD-10-CM | POA: Diagnosis not present

## 2019-10-19 DIAGNOSIS — M1A9XX0 Chronic gout, unspecified, without tophus (tophi): Secondary | ICD-10-CM | POA: Diagnosis not present

## 2019-10-19 DIAGNOSIS — C3412 Malignant neoplasm of upper lobe, left bronchus or lung: Secondary | ICD-10-CM | POA: Diagnosis not present

## 2019-10-19 DIAGNOSIS — M150 Primary generalized (osteo)arthritis: Secondary | ICD-10-CM | POA: Diagnosis not present

## 2019-10-19 DIAGNOSIS — R293 Abnormal posture: Secondary | ICD-10-CM | POA: Diagnosis not present

## 2019-10-19 DIAGNOSIS — M86171 Other acute osteomyelitis, right ankle and foot: Secondary | ICD-10-CM | POA: Diagnosis not present

## 2019-10-19 DIAGNOSIS — M797 Fibromyalgia: Secondary | ICD-10-CM | POA: Diagnosis not present

## 2019-10-19 DIAGNOSIS — C7931 Secondary malignant neoplasm of brain: Secondary | ICD-10-CM | POA: Diagnosis not present

## 2019-10-19 DIAGNOSIS — Z20828 Contact with and (suspected) exposure to other viral communicable diseases: Secondary | ICD-10-CM | POA: Diagnosis not present

## 2019-10-19 DIAGNOSIS — F331 Major depressive disorder, recurrent, moderate: Secondary | ICD-10-CM | POA: Diagnosis not present

## 2019-10-19 DIAGNOSIS — K2100 Gastro-esophageal reflux disease with esophagitis, without bleeding: Secondary | ICD-10-CM | POA: Diagnosis not present

## 2019-10-19 DIAGNOSIS — D631 Anemia in chronic kidney disease: Secondary | ICD-10-CM | POA: Diagnosis not present

## 2019-10-19 DIAGNOSIS — D510 Vitamin B12 deficiency anemia due to intrinsic factor deficiency: Secondary | ICD-10-CM | POA: Diagnosis not present

## 2019-10-19 DIAGNOSIS — G20 Parkinson's disease: Secondary | ICD-10-CM | POA: Diagnosis not present

## 2019-10-19 DIAGNOSIS — G309 Alzheimer's disease, unspecified: Secondary | ICD-10-CM | POA: Diagnosis not present

## 2019-10-19 DIAGNOSIS — E1140 Type 2 diabetes mellitus with diabetic neuropathy, unspecified: Secondary | ICD-10-CM | POA: Diagnosis not present

## 2019-10-19 DIAGNOSIS — T8131XA Disruption of external operation (surgical) wound, not elsewhere classified, initial encounter: Secondary | ICD-10-CM | POA: Diagnosis not present

## 2019-10-19 DIAGNOSIS — E079 Disorder of thyroid, unspecified: Secondary | ICD-10-CM | POA: Diagnosis not present

## 2019-10-19 DIAGNOSIS — K219 Gastro-esophageal reflux disease without esophagitis: Secondary | ICD-10-CM | POA: Diagnosis not present

## 2019-10-19 DIAGNOSIS — H04129 Dry eye syndrome of unspecified lacrimal gland: Secondary | ICD-10-CM | POA: Diagnosis not present

## 2019-10-19 DIAGNOSIS — M069 Rheumatoid arthritis, unspecified: Secondary | ICD-10-CM | POA: Diagnosis not present

## 2019-10-19 DIAGNOSIS — I083 Combined rheumatic disorders of mitral, aortic and tricuspid valves: Secondary | ICD-10-CM | POA: Diagnosis not present

## 2019-10-19 DIAGNOSIS — I129 Hypertensive chronic kidney disease with stage 1 through stage 4 chronic kidney disease, or unspecified chronic kidney disease: Secondary | ICD-10-CM | POA: Diagnosis not present

## 2019-10-19 DIAGNOSIS — M5116 Intervertebral disc disorders with radiculopathy, lumbar region: Secondary | ICD-10-CM | POA: Diagnosis not present

## 2019-10-19 DIAGNOSIS — I951 Orthostatic hypotension: Secondary | ICD-10-CM | POA: Diagnosis not present

## 2019-10-19 DIAGNOSIS — R52 Pain, unspecified: Secondary | ICD-10-CM | POA: Diagnosis not present

## 2019-10-19 DIAGNOSIS — Z466 Encounter for fitting and adjustment of urinary device: Secondary | ICD-10-CM | POA: Diagnosis not present

## 2019-10-19 DIAGNOSIS — J188 Other pneumonia, unspecified organism: Secondary | ICD-10-CM | POA: Diagnosis not present

## 2019-10-19 DIAGNOSIS — Z87891 Personal history of nicotine dependence: Secondary | ICD-10-CM | POA: Diagnosis not present

## 2019-10-19 DIAGNOSIS — S80211D Abrasion, right knee, subsequent encounter: Secondary | ICD-10-CM | POA: Diagnosis not present

## 2019-10-19 DIAGNOSIS — E039 Hypothyroidism, unspecified: Secondary | ICD-10-CM | POA: Diagnosis not present

## 2019-10-19 DIAGNOSIS — E1122 Type 2 diabetes mellitus with diabetic chronic kidney disease: Secondary | ICD-10-CM | POA: Diagnosis not present

## 2019-10-19 DIAGNOSIS — N183 Chronic kidney disease, stage 3 unspecified: Secondary | ICD-10-CM | POA: Diagnosis not present

## 2019-10-19 DIAGNOSIS — Z6841 Body Mass Index (BMI) 40.0 and over, adult: Secondary | ICD-10-CM | POA: Diagnosis not present

## 2019-10-19 DIAGNOSIS — M1990 Unspecified osteoarthritis, unspecified site: Secondary | ICD-10-CM | POA: Diagnosis not present

## 2019-10-19 DIAGNOSIS — J42 Unspecified chronic bronchitis: Secondary | ICD-10-CM | POA: Diagnosis not present

## 2019-10-19 DIAGNOSIS — K5900 Constipation, unspecified: Secondary | ICD-10-CM | POA: Diagnosis not present

## 2019-10-19 DIAGNOSIS — E11649 Type 2 diabetes mellitus with hypoglycemia without coma: Secondary | ICD-10-CM | POA: Diagnosis not present

## 2019-10-19 DIAGNOSIS — I480 Paroxysmal atrial fibrillation: Secondary | ICD-10-CM | POA: Diagnosis not present

## 2019-10-19 DIAGNOSIS — G4733 Obstructive sleep apnea (adult) (pediatric): Secondary | ICD-10-CM | POA: Diagnosis not present

## 2019-10-19 DIAGNOSIS — F988 Other specified behavioral and emotional disorders with onset usually occurring in childhood and adolescence: Secondary | ICD-10-CM | POA: Diagnosis not present

## 2019-10-19 DIAGNOSIS — F0281 Dementia in other diseases classified elsewhere with behavioral disturbance: Secondary | ICD-10-CM | POA: Diagnosis not present

## 2019-10-19 DIAGNOSIS — I120 Hypertensive chronic kidney disease with stage 5 chronic kidney disease or end stage renal disease: Secondary | ICD-10-CM | POA: Diagnosis not present

## 2019-10-19 DIAGNOSIS — C3491 Malignant neoplasm of unspecified part of right bronchus or lung: Secondary | ICD-10-CM | POA: Diagnosis not present

## 2019-10-19 DIAGNOSIS — N189 Chronic kidney disease, unspecified: Secondary | ICD-10-CM | POA: Diagnosis not present

## 2019-10-19 DIAGNOSIS — L22 Diaper dermatitis: Secondary | ICD-10-CM | POA: Diagnosis not present

## 2019-10-19 DIAGNOSIS — L97519 Non-pressure chronic ulcer of other part of right foot with unspecified severity: Secondary | ICD-10-CM | POA: Diagnosis not present

## 2019-10-19 DIAGNOSIS — B9620 Unspecified Escherichia coli [E. coli] as the cause of diseases classified elsewhere: Secondary | ICD-10-CM | POA: Diagnosis not present

## 2019-10-19 DIAGNOSIS — E46 Unspecified protein-calorie malnutrition: Secondary | ICD-10-CM | POA: Diagnosis not present

## 2019-10-19 DIAGNOSIS — Z03818 Encounter for observation for suspected exposure to other biological agents ruled out: Secondary | ICD-10-CM | POA: Diagnosis not present

## 2019-10-19 DIAGNOSIS — Z7901 Long term (current) use of anticoagulants: Secondary | ICD-10-CM | POA: Diagnosis not present

## 2019-10-19 DIAGNOSIS — I69354 Hemiplegia and hemiparesis following cerebral infarction affecting left non-dominant side: Secondary | ICD-10-CM | POA: Diagnosis not present

## 2019-10-19 DIAGNOSIS — G7281 Critical illness myopathy: Secondary | ICD-10-CM | POA: Diagnosis not present

## 2019-10-19 DIAGNOSIS — R3914 Feeling of incomplete bladder emptying: Secondary | ICD-10-CM | POA: Diagnosis not present

## 2019-10-19 DIAGNOSIS — E119 Type 2 diabetes mellitus without complications: Secondary | ICD-10-CM | POA: Diagnosis not present

## 2019-10-19 DIAGNOSIS — N138 Other obstructive and reflux uropathy: Secondary | ICD-10-CM | POA: Diagnosis not present

## 2019-10-19 DIAGNOSIS — F3289 Other specified depressive episodes: Secondary | ICD-10-CM | POA: Diagnosis not present

## 2019-10-19 DIAGNOSIS — L723 Sebaceous cyst: Secondary | ICD-10-CM | POA: Diagnosis not present

## 2019-10-19 DIAGNOSIS — S52532D Colles' fracture of left radius, subsequent encounter for closed fracture with routine healing: Secondary | ICD-10-CM | POA: Diagnosis not present

## 2019-10-19 DIAGNOSIS — S32028D Other fracture of second lumbar vertebra, subsequent encounter for fracture with routine healing: Secondary | ICD-10-CM | POA: Diagnosis not present

## 2019-10-19 DIAGNOSIS — D3000 Benign neoplasm of unspecified kidney: Secondary | ICD-10-CM | POA: Diagnosis not present

## 2019-10-19 DIAGNOSIS — I350 Nonrheumatic aortic (valve) stenosis: Secondary | ICD-10-CM | POA: Diagnosis not present

## 2019-10-19 DIAGNOSIS — D638 Anemia in other chronic diseases classified elsewhere: Secondary | ICD-10-CM | POA: Diagnosis not present

## 2019-10-19 DIAGNOSIS — R269 Unspecified abnormalities of gait and mobility: Secondary | ICD-10-CM | POA: Diagnosis not present

## 2019-10-19 DIAGNOSIS — I4891 Unspecified atrial fibrillation: Secondary | ICD-10-CM | POA: Diagnosis not present

## 2019-10-19 DIAGNOSIS — H3530 Unspecified macular degeneration: Secondary | ICD-10-CM | POA: Diagnosis not present

## 2019-10-19 DIAGNOSIS — M170 Bilateral primary osteoarthritis of knee: Secondary | ICD-10-CM | POA: Diagnosis not present

## 2019-10-19 DIAGNOSIS — I130 Hypertensive heart and chronic kidney disease with heart failure and stage 1 through stage 4 chronic kidney disease, or unspecified chronic kidney disease: Secondary | ICD-10-CM | POA: Diagnosis not present

## 2019-10-19 DIAGNOSIS — J449 Chronic obstructive pulmonary disease, unspecified: Secondary | ICD-10-CM | POA: Diagnosis not present

## 2019-10-19 DIAGNOSIS — F411 Generalized anxiety disorder: Secondary | ICD-10-CM | POA: Diagnosis not present

## 2019-10-19 DIAGNOSIS — S2232XD Fracture of one rib, left side, subsequent encounter for fracture with routine healing: Secondary | ICD-10-CM | POA: Diagnosis not present

## 2019-10-19 DIAGNOSIS — L720 Epidermal cyst: Secondary | ICD-10-CM | POA: Diagnosis not present

## 2019-10-19 DIAGNOSIS — M5136 Other intervertebral disc degeneration, lumbar region: Secondary | ICD-10-CM | POA: Diagnosis not present

## 2019-10-19 DIAGNOSIS — R110 Nausea: Secondary | ICD-10-CM | POA: Diagnosis not present

## 2019-10-19 DIAGNOSIS — S32018D Other fracture of first lumbar vertebra, subsequent encounter for fracture with routine healing: Secondary | ICD-10-CM | POA: Diagnosis not present

## 2019-10-19 DIAGNOSIS — J432 Centrilobular emphysema: Secondary | ICD-10-CM | POA: Diagnosis not present

## 2019-10-19 DIAGNOSIS — G4730 Sleep apnea, unspecified: Secondary | ICD-10-CM | POA: Diagnosis not present

## 2019-10-19 DIAGNOSIS — S15102D Unspecified injury of left vertebral artery, subsequent encounter: Secondary | ICD-10-CM | POA: Diagnosis not present

## 2019-10-19 DIAGNOSIS — R569 Unspecified convulsions: Secondary | ICD-10-CM | POA: Diagnosis not present

## 2019-10-19 DIAGNOSIS — Z6829 Body mass index (BMI) 29.0-29.9, adult: Secondary | ICD-10-CM | POA: Diagnosis not present

## 2019-10-19 DIAGNOSIS — Z4800 Encounter for change or removal of nonsurgical wound dressing: Secondary | ICD-10-CM | POA: Diagnosis not present

## 2019-10-19 DIAGNOSIS — R278 Other lack of coordination: Secondary | ICD-10-CM | POA: Diagnosis not present

## 2019-10-19 DIAGNOSIS — M545 Low back pain: Secondary | ICD-10-CM | POA: Diagnosis not present

## 2019-10-19 DIAGNOSIS — K921 Melena: Secondary | ICD-10-CM | POA: Diagnosis not present

## 2019-10-19 DIAGNOSIS — I2510 Atherosclerotic heart disease of native coronary artery without angina pectoris: Secondary | ICD-10-CM | POA: Diagnosis not present

## 2019-10-19 DIAGNOSIS — M353 Polymyalgia rheumatica: Secondary | ICD-10-CM | POA: Diagnosis not present

## 2019-10-19 DIAGNOSIS — M19019 Primary osteoarthritis, unspecified shoulder: Secondary | ICD-10-CM | POA: Diagnosis not present

## 2019-10-19 DIAGNOSIS — M5416 Radiculopathy, lumbar region: Secondary | ICD-10-CM | POA: Diagnosis not present

## 2019-10-19 DIAGNOSIS — M5030 Other cervical disc degeneration, unspecified cervical region: Secondary | ICD-10-CM | POA: Diagnosis not present

## 2019-10-19 DIAGNOSIS — E785 Hyperlipidemia, unspecified: Secondary | ICD-10-CM | POA: Diagnosis not present

## 2019-10-19 DIAGNOSIS — Z79891 Long term (current) use of opiate analgesic: Secondary | ICD-10-CM | POA: Diagnosis not present

## 2019-10-19 DIAGNOSIS — M47817 Spondylosis without myelopathy or radiculopathy, lumbosacral region: Secondary | ICD-10-CM | POA: Diagnosis not present

## 2019-10-19 DIAGNOSIS — E11621 Type 2 diabetes mellitus with foot ulcer: Secondary | ICD-10-CM | POA: Diagnosis not present

## 2019-10-19 DIAGNOSIS — J849 Interstitial pulmonary disease, unspecified: Secondary | ICD-10-CM | POA: Diagnosis not present

## 2019-10-19 DIAGNOSIS — S12500D Unspecified displaced fracture of sixth cervical vertebra, subsequent encounter for fracture with routine healing: Secondary | ICD-10-CM | POA: Diagnosis not present

## 2019-10-19 DIAGNOSIS — G8929 Other chronic pain: Secondary | ICD-10-CM | POA: Diagnosis not present

## 2019-10-19 DIAGNOSIS — R2681 Unsteadiness on feet: Secondary | ICD-10-CM | POA: Diagnosis not present

## 2019-10-19 DIAGNOSIS — M4854XD Collapsed vertebra, not elsewhere classified, thoracic region, subsequent encounter for fracture with routine healing: Secondary | ICD-10-CM | POA: Diagnosis not present

## 2019-10-19 DIAGNOSIS — E1165 Type 2 diabetes mellitus with hyperglycemia: Secondary | ICD-10-CM | POA: Diagnosis not present

## 2019-10-19 DIAGNOSIS — J9611 Chronic respiratory failure with hypoxia: Secondary | ICD-10-CM | POA: Diagnosis not present

## 2019-10-19 DIAGNOSIS — M80031D Age-related osteoporosis with current pathological fracture, right forearm, subsequent encounter for fracture with routine healing: Secondary | ICD-10-CM | POA: Diagnosis not present

## 2019-10-20 DIAGNOSIS — M6281 Muscle weakness (generalized): Secondary | ICD-10-CM | POA: Diagnosis not present

## 2019-10-20 DIAGNOSIS — J9383 Other pneumothorax: Secondary | ICD-10-CM | POA: Diagnosis not present

## 2019-10-20 DIAGNOSIS — Z299 Encounter for prophylactic measures, unspecified: Secondary | ICD-10-CM | POA: Diagnosis not present

## 2019-10-20 DIAGNOSIS — E039 Hypothyroidism, unspecified: Secondary | ICD-10-CM | POA: Diagnosis not present

## 2019-10-20 DIAGNOSIS — I110 Hypertensive heart disease with heart failure: Secondary | ICD-10-CM | POA: Diagnosis not present

## 2019-10-20 DIAGNOSIS — G8929 Other chronic pain: Secondary | ICD-10-CM | POA: Diagnosis not present

## 2019-10-20 DIAGNOSIS — I6930 Unspecified sequelae of cerebral infarction: Secondary | ICD-10-CM | POA: Diagnosis not present

## 2019-10-20 DIAGNOSIS — J441 Chronic obstructive pulmonary disease with (acute) exacerbation: Secondary | ICD-10-CM | POA: Diagnosis not present

## 2019-10-20 DIAGNOSIS — M1990 Unspecified osteoarthritis, unspecified site: Secondary | ICD-10-CM | POA: Diagnosis not present

## 2019-10-20 DIAGNOSIS — I350 Nonrheumatic aortic (valve) stenosis: Secondary | ICD-10-CM | POA: Diagnosis not present

## 2019-10-20 DIAGNOSIS — Z7189 Other specified counseling: Secondary | ICD-10-CM | POA: Diagnosis not present

## 2019-10-20 DIAGNOSIS — E291 Testicular hypofunction: Secondary | ICD-10-CM | POA: Diagnosis not present

## 2019-10-20 DIAGNOSIS — R54 Age-related physical debility: Secondary | ICD-10-CM | POA: Diagnosis not present

## 2019-10-20 DIAGNOSIS — L89312 Pressure ulcer of right buttock, stage 2: Secondary | ICD-10-CM | POA: Diagnosis not present

## 2019-10-20 DIAGNOSIS — R918 Other nonspecific abnormal finding of lung field: Secondary | ICD-10-CM | POA: Diagnosis not present

## 2019-10-20 DIAGNOSIS — I69354 Hemiplegia and hemiparesis following cerebral infarction affecting left non-dominant side: Secondary | ICD-10-CM | POA: Diagnosis not present

## 2019-10-20 DIAGNOSIS — J9612 Chronic respiratory failure with hypercapnia: Secondary | ICD-10-CM | POA: Diagnosis not present

## 2019-10-20 DIAGNOSIS — R5383 Other fatigue: Secondary | ICD-10-CM | POA: Diagnosis not present

## 2019-10-20 DIAGNOSIS — R001 Bradycardia, unspecified: Secondary | ICD-10-CM | POA: Diagnosis not present

## 2019-10-20 DIAGNOSIS — M1712 Unilateral primary osteoarthritis, left knee: Secondary | ICD-10-CM | POA: Diagnosis not present

## 2019-10-20 DIAGNOSIS — Z20828 Contact with and (suspected) exposure to other viral communicable diseases: Secondary | ICD-10-CM | POA: Diagnosis not present

## 2019-10-20 DIAGNOSIS — E1159 Type 2 diabetes mellitus with other circulatory complications: Secondary | ICD-10-CM | POA: Diagnosis not present

## 2019-10-20 DIAGNOSIS — R1312 Dysphagia, oropharyngeal phase: Secondary | ICD-10-CM | POA: Diagnosis not present

## 2019-10-20 DIAGNOSIS — R21 Rash and other nonspecific skin eruption: Secondary | ICD-10-CM | POA: Diagnosis not present

## 2019-10-20 DIAGNOSIS — I10 Essential (primary) hypertension: Secondary | ICD-10-CM | POA: Diagnosis not present

## 2019-10-20 DIAGNOSIS — M545 Low back pain: Secondary | ICD-10-CM | POA: Diagnosis not present

## 2019-10-20 DIAGNOSIS — I4821 Permanent atrial fibrillation: Secondary | ICD-10-CM | POA: Diagnosis not present

## 2019-10-20 DIAGNOSIS — E559 Vitamin D deficiency, unspecified: Secondary | ICD-10-CM | POA: Diagnosis not present

## 2019-10-20 DIAGNOSIS — I5032 Chronic diastolic (congestive) heart failure: Secondary | ICD-10-CM | POA: Diagnosis not present

## 2019-10-20 DIAGNOSIS — Z4800 Encounter for change or removal of nonsurgical wound dressing: Secondary | ICD-10-CM | POA: Diagnosis not present

## 2019-10-20 DIAGNOSIS — E785 Hyperlipidemia, unspecified: Secondary | ICD-10-CM | POA: Diagnosis not present

## 2019-10-20 DIAGNOSIS — M109 Gout, unspecified: Secondary | ICD-10-CM | POA: Diagnosis not present

## 2019-10-20 DIAGNOSIS — G8194 Hemiplegia, unspecified affecting left nondominant side: Secondary | ICD-10-CM | POA: Diagnosis not present

## 2019-10-20 DIAGNOSIS — Z1339 Encounter for screening examination for other mental health and behavioral disorders: Secondary | ICD-10-CM | POA: Diagnosis not present

## 2019-10-20 DIAGNOSIS — I69954 Hemiplegia and hemiparesis following unspecified cerebrovascular disease affecting left non-dominant side: Secondary | ICD-10-CM | POA: Diagnosis not present

## 2019-10-20 DIAGNOSIS — L89620 Pressure ulcer of left heel, unstageable: Secondary | ICD-10-CM | POA: Diagnosis not present

## 2019-10-20 DIAGNOSIS — E1165 Type 2 diabetes mellitus with hyperglycemia: Secondary | ICD-10-CM | POA: Diagnosis not present

## 2019-10-20 DIAGNOSIS — Z6829 Body mass index (BMI) 29.0-29.9, adult: Secondary | ICD-10-CM | POA: Diagnosis not present

## 2019-10-20 DIAGNOSIS — M810 Age-related osteoporosis without current pathological fracture: Secondary | ICD-10-CM | POA: Diagnosis not present

## 2019-10-20 DIAGNOSIS — L89152 Pressure ulcer of sacral region, stage 2: Secondary | ICD-10-CM | POA: Diagnosis not present

## 2019-10-20 DIAGNOSIS — E0500 Thyrotoxicosis with diffuse goiter without thyrotoxic crisis or storm: Secondary | ICD-10-CM | POA: Diagnosis not present

## 2019-10-20 DIAGNOSIS — Z0000 Encounter for general adult medical examination without abnormal findings: Secondary | ICD-10-CM | POA: Diagnosis not present

## 2019-10-20 DIAGNOSIS — E7800 Pure hypercholesterolemia, unspecified: Secondary | ICD-10-CM | POA: Diagnosis not present

## 2019-10-20 DIAGNOSIS — R0602 Shortness of breath: Secondary | ICD-10-CM | POA: Diagnosis not present

## 2019-10-20 DIAGNOSIS — J449 Chronic obstructive pulmonary disease, unspecified: Secondary | ICD-10-CM | POA: Diagnosis not present

## 2019-10-20 DIAGNOSIS — L89610 Pressure ulcer of right heel, unstageable: Secondary | ICD-10-CM | POA: Diagnosis not present

## 2019-10-20 DIAGNOSIS — Z1211 Encounter for screening for malignant neoplasm of colon: Secondary | ICD-10-CM | POA: Diagnosis not present

## 2019-10-20 DIAGNOSIS — Z1331 Encounter for screening for depression: Secondary | ICD-10-CM | POA: Diagnosis not present

## 2019-10-20 DIAGNOSIS — E890 Postprocedural hypothyroidism: Secondary | ICD-10-CM | POA: Diagnosis not present

## 2019-10-20 DIAGNOSIS — M0589 Other rheumatoid arthritis with rheumatoid factor of multiple sites: Secondary | ICD-10-CM | POA: Diagnosis not present

## 2019-10-20 DIAGNOSIS — J9611 Chronic respiratory failure with hypoxia: Secondary | ICD-10-CM | POA: Diagnosis not present

## 2019-10-21 DIAGNOSIS — E1122 Type 2 diabetes mellitus with diabetic chronic kidney disease: Secondary | ICD-10-CM | POA: Diagnosis not present

## 2019-10-21 DIAGNOSIS — M1990 Unspecified osteoarthritis, unspecified site: Secondary | ICD-10-CM | POA: Diagnosis not present

## 2019-10-21 DIAGNOSIS — M6281 Muscle weakness (generalized): Secondary | ICD-10-CM | POA: Diagnosis not present

## 2019-10-21 DIAGNOSIS — L820 Inflamed seborrheic keratosis: Secondary | ICD-10-CM | POA: Diagnosis not present

## 2019-10-21 DIAGNOSIS — Z4789 Encounter for other orthopedic aftercare: Secondary | ICD-10-CM | POA: Diagnosis not present

## 2019-10-21 DIAGNOSIS — E782 Mixed hyperlipidemia: Secondary | ICD-10-CM | POA: Diagnosis not present

## 2019-10-21 DIAGNOSIS — M4316 Spondylolisthesis, lumbar region: Secondary | ICD-10-CM | POA: Diagnosis not present

## 2019-10-21 DIAGNOSIS — I25118 Atherosclerotic heart disease of native coronary artery with other forms of angina pectoris: Secondary | ICD-10-CM | POA: Diagnosis not present

## 2019-10-21 DIAGNOSIS — K589 Irritable bowel syndrome without diarrhea: Secondary | ICD-10-CM | POA: Diagnosis not present

## 2019-10-21 DIAGNOSIS — Z96652 Presence of left artificial knee joint: Secondary | ICD-10-CM | POA: Diagnosis not present

## 2019-10-21 DIAGNOSIS — I480 Paroxysmal atrial fibrillation: Secondary | ICD-10-CM | POA: Diagnosis not present

## 2019-10-21 DIAGNOSIS — Z299 Encounter for prophylactic measures, unspecified: Secondary | ICD-10-CM | POA: Diagnosis not present

## 2019-10-21 DIAGNOSIS — Z0000 Encounter for general adult medical examination without abnormal findings: Secondary | ICD-10-CM | POA: Diagnosis not present

## 2019-10-21 DIAGNOSIS — I10 Essential (primary) hypertension: Secondary | ICD-10-CM | POA: Diagnosis not present

## 2019-10-21 DIAGNOSIS — K7469 Other cirrhosis of liver: Secondary | ICD-10-CM | POA: Diagnosis not present

## 2019-10-21 DIAGNOSIS — Z1339 Encounter for screening examination for other mental health and behavioral disorders: Secondary | ICD-10-CM | POA: Diagnosis not present

## 2019-10-21 DIAGNOSIS — N390 Urinary tract infection, site not specified: Secondary | ICD-10-CM | POA: Diagnosis not present

## 2019-10-21 DIAGNOSIS — N189 Chronic kidney disease, unspecified: Secondary | ICD-10-CM | POA: Diagnosis not present

## 2019-10-21 DIAGNOSIS — Z1331 Encounter for screening for depression: Secondary | ICD-10-CM | POA: Diagnosis not present

## 2019-10-21 DIAGNOSIS — R112 Nausea with vomiting, unspecified: Secondary | ICD-10-CM | POA: Diagnosis not present

## 2019-10-21 DIAGNOSIS — E1121 Type 2 diabetes mellitus with diabetic nephropathy: Secondary | ICD-10-CM | POA: Diagnosis not present

## 2019-10-21 DIAGNOSIS — R05 Cough: Secondary | ICD-10-CM | POA: Diagnosis not present

## 2019-10-21 DIAGNOSIS — M109 Gout, unspecified: Secondary | ICD-10-CM | POA: Diagnosis not present

## 2019-10-21 DIAGNOSIS — I129 Hypertensive chronic kidney disease with stage 1 through stage 4 chronic kidney disease, or unspecified chronic kidney disease: Secondary | ICD-10-CM | POA: Diagnosis not present

## 2019-10-21 DIAGNOSIS — Z1231 Encounter for screening mammogram for malignant neoplasm of breast: Secondary | ICD-10-CM | POA: Diagnosis not present

## 2019-10-21 DIAGNOSIS — N1832 Chronic kidney disease, stage 3b: Secondary | ICD-10-CM | POA: Diagnosis not present

## 2019-10-21 DIAGNOSIS — K2270 Barrett's esophagus without dysplasia: Secondary | ICD-10-CM | POA: Diagnosis not present

## 2019-10-21 DIAGNOSIS — R5381 Other malaise: Secondary | ICD-10-CM | POA: Diagnosis not present

## 2019-10-21 DIAGNOSIS — K5730 Diverticulosis of large intestine without perforation or abscess without bleeding: Secondary | ICD-10-CM | POA: Diagnosis not present

## 2019-10-21 DIAGNOSIS — L538 Other specified erythematous conditions: Secondary | ICD-10-CM | POA: Diagnosis not present

## 2019-10-21 DIAGNOSIS — Z1211 Encounter for screening for malignant neoplasm of colon: Secondary | ICD-10-CM | POA: Diagnosis not present

## 2019-10-21 DIAGNOSIS — H209 Unspecified iridocyclitis: Secondary | ICD-10-CM | POA: Diagnosis not present

## 2019-10-21 DIAGNOSIS — L570 Actinic keratosis: Secondary | ICD-10-CM | POA: Diagnosis not present

## 2019-10-21 DIAGNOSIS — R278 Other lack of coordination: Secondary | ICD-10-CM | POA: Diagnosis not present

## 2019-10-21 DIAGNOSIS — R1033 Periumbilical pain: Secondary | ICD-10-CM | POA: Diagnosis not present

## 2019-10-21 DIAGNOSIS — M797 Fibromyalgia: Secondary | ICD-10-CM | POA: Diagnosis not present

## 2019-10-21 DIAGNOSIS — E7800 Pure hypercholesterolemia, unspecified: Secondary | ICD-10-CM | POA: Diagnosis not present

## 2019-10-21 DIAGNOSIS — M47816 Spondylosis without myelopathy or radiculopathy, lumbar region: Secondary | ICD-10-CM | POA: Diagnosis not present

## 2019-10-21 DIAGNOSIS — R5383 Other fatigue: Secondary | ICD-10-CM | POA: Diagnosis not present

## 2019-10-21 DIAGNOSIS — G894 Chronic pain syndrome: Secondary | ICD-10-CM | POA: Diagnosis not present

## 2019-10-21 DIAGNOSIS — E039 Hypothyroidism, unspecified: Secondary | ICD-10-CM | POA: Diagnosis not present

## 2019-10-21 DIAGNOSIS — D235 Other benign neoplasm of skin of trunk: Secondary | ICD-10-CM | POA: Diagnosis not present

## 2019-10-21 DIAGNOSIS — L728 Other follicular cysts of the skin and subcutaneous tissue: Secondary | ICD-10-CM | POA: Diagnosis not present

## 2019-10-21 DIAGNOSIS — Z6832 Body mass index (BMI) 32.0-32.9, adult: Secondary | ICD-10-CM | POA: Diagnosis not present

## 2019-10-21 DIAGNOSIS — R531 Weakness: Secondary | ICD-10-CM | POA: Diagnosis not present

## 2019-10-21 DIAGNOSIS — G4733 Obstructive sleep apnea (adult) (pediatric): Secondary | ICD-10-CM | POA: Diagnosis not present

## 2019-10-21 DIAGNOSIS — Z7189 Other specified counseling: Secondary | ICD-10-CM | POA: Diagnosis not present

## 2019-10-21 DIAGNOSIS — R188 Other ascites: Secondary | ICD-10-CM | POA: Diagnosis not present

## 2019-10-21 DIAGNOSIS — R0602 Shortness of breath: Secondary | ICD-10-CM | POA: Diagnosis not present

## 2019-10-21 DIAGNOSIS — E1165 Type 2 diabetes mellitus with hyperglycemia: Secondary | ICD-10-CM | POA: Diagnosis not present

## 2019-10-21 DIAGNOSIS — Z6827 Body mass index (BMI) 27.0-27.9, adult: Secondary | ICD-10-CM | POA: Diagnosis not present

## 2019-10-21 DIAGNOSIS — J209 Acute bronchitis, unspecified: Secondary | ICD-10-CM | POA: Diagnosis not present

## 2019-10-22 DIAGNOSIS — F17210 Nicotine dependence, cigarettes, uncomplicated: Secondary | ICD-10-CM | POA: Diagnosis not present

## 2019-10-22 DIAGNOSIS — Z20828 Contact with and (suspected) exposure to other viral communicable diseases: Secondary | ICD-10-CM | POA: Diagnosis not present

## 2019-10-22 DIAGNOSIS — Z79899 Other long term (current) drug therapy: Secondary | ICD-10-CM | POA: Diagnosis not present

## 2019-10-22 DIAGNOSIS — B0082 Herpes simplex myelitis: Secondary | ICD-10-CM | POA: Diagnosis not present

## 2019-10-22 DIAGNOSIS — G894 Chronic pain syndrome: Secondary | ICD-10-CM | POA: Diagnosis not present

## 2019-10-22 DIAGNOSIS — M1812 Unilateral primary osteoarthritis of first carpometacarpal joint, left hand: Secondary | ICD-10-CM | POA: Diagnosis not present

## 2019-10-22 DIAGNOSIS — N390 Urinary tract infection, site not specified: Secondary | ICD-10-CM | POA: Diagnosis not present

## 2019-10-22 DIAGNOSIS — R519 Headache, unspecified: Secondary | ICD-10-CM | POA: Diagnosis not present

## 2019-10-22 DIAGNOSIS — M1811 Unilateral primary osteoarthritis of first carpometacarpal joint, right hand: Secondary | ICD-10-CM | POA: Diagnosis not present

## 2019-10-22 DIAGNOSIS — E876 Hypokalemia: Secondary | ICD-10-CM | POA: Diagnosis not present

## 2019-10-22 DIAGNOSIS — M79642 Pain in left hand: Secondary | ICD-10-CM | POA: Diagnosis not present

## 2019-10-22 DIAGNOSIS — M549 Dorsalgia, unspecified: Secondary | ICD-10-CM | POA: Diagnosis not present

## 2019-10-22 DIAGNOSIS — Z1339 Encounter for screening examination for other mental health and behavioral disorders: Secondary | ICD-10-CM | POA: Diagnosis not present

## 2019-10-22 DIAGNOSIS — R569 Unspecified convulsions: Secondary | ICD-10-CM | POA: Diagnosis not present

## 2019-10-22 DIAGNOSIS — I129 Hypertensive chronic kidney disease with stage 1 through stage 4 chronic kidney disease, or unspecified chronic kidney disease: Secondary | ICD-10-CM | POA: Diagnosis not present

## 2019-10-22 DIAGNOSIS — R590 Localized enlarged lymph nodes: Secondary | ICD-10-CM | POA: Diagnosis not present

## 2019-10-22 DIAGNOSIS — R002 Palpitations: Secondary | ICD-10-CM | POA: Diagnosis not present

## 2019-10-22 DIAGNOSIS — I10 Essential (primary) hypertension: Secondary | ICD-10-CM | POA: Diagnosis not present

## 2019-10-22 DIAGNOSIS — G629 Polyneuropathy, unspecified: Secondary | ICD-10-CM | POA: Diagnosis not present

## 2019-10-22 DIAGNOSIS — F419 Anxiety disorder, unspecified: Secondary | ICD-10-CM | POA: Diagnosis not present

## 2019-10-22 DIAGNOSIS — Z0000 Encounter for general adult medical examination without abnormal findings: Secondary | ICD-10-CM | POA: Diagnosis not present

## 2019-10-22 DIAGNOSIS — I4891 Unspecified atrial fibrillation: Secondary | ICD-10-CM | POA: Diagnosis not present

## 2019-10-22 DIAGNOSIS — Z1211 Encounter for screening for malignant neoplasm of colon: Secondary | ICD-10-CM | POA: Diagnosis not present

## 2019-10-22 DIAGNOSIS — Z9181 History of falling: Secondary | ICD-10-CM | POA: Diagnosis not present

## 2019-10-22 DIAGNOSIS — R6520 Severe sepsis without septic shock: Secondary | ICD-10-CM | POA: Diagnosis not present

## 2019-10-22 DIAGNOSIS — Z7189 Other specified counseling: Secondary | ICD-10-CM | POA: Diagnosis not present

## 2019-10-22 DIAGNOSIS — Z299 Encounter for prophylactic measures, unspecified: Secondary | ICD-10-CM | POA: Diagnosis not present

## 2019-10-22 DIAGNOSIS — Z6828 Body mass index (BMI) 28.0-28.9, adult: Secondary | ICD-10-CM | POA: Diagnosis not present

## 2019-10-22 DIAGNOSIS — K5792 Diverticulitis of intestine, part unspecified, without perforation or abscess without bleeding: Secondary | ICD-10-CM | POA: Diagnosis not present

## 2019-10-22 DIAGNOSIS — R269 Unspecified abnormalities of gait and mobility: Secondary | ICD-10-CM | POA: Diagnosis not present

## 2019-10-22 DIAGNOSIS — D649 Anemia, unspecified: Secondary | ICD-10-CM | POA: Diagnosis not present

## 2019-10-22 DIAGNOSIS — E1159 Type 2 diabetes mellitus with other circulatory complications: Secondary | ICD-10-CM | POA: Diagnosis not present

## 2019-10-22 DIAGNOSIS — R404 Transient alteration of awareness: Secondary | ICD-10-CM | POA: Diagnosis not present

## 2019-10-22 DIAGNOSIS — G8929 Other chronic pain: Secondary | ICD-10-CM | POA: Diagnosis not present

## 2019-10-22 DIAGNOSIS — R4182 Altered mental status, unspecified: Secondary | ICD-10-CM | POA: Diagnosis not present

## 2019-10-22 DIAGNOSIS — G43009 Migraine without aura, not intractable, without status migrainosus: Secondary | ICD-10-CM | POA: Diagnosis not present

## 2019-10-22 DIAGNOSIS — R561 Post traumatic seizures: Secondary | ICD-10-CM | POA: Diagnosis not present

## 2019-10-22 DIAGNOSIS — Z743 Need for continuous supervision: Secondary | ICD-10-CM | POA: Diagnosis not present

## 2019-10-22 DIAGNOSIS — J9601 Acute respiratory failure with hypoxia: Secondary | ICD-10-CM | POA: Diagnosis not present

## 2019-10-22 DIAGNOSIS — A419 Sepsis, unspecified organism: Secondary | ICD-10-CM | POA: Diagnosis not present

## 2019-10-22 DIAGNOSIS — R531 Weakness: Secondary | ICD-10-CM | POA: Diagnosis not present

## 2019-10-22 DIAGNOSIS — E782 Mixed hyperlipidemia: Secondary | ICD-10-CM | POA: Diagnosis not present

## 2019-10-22 DIAGNOSIS — M79641 Pain in right hand: Secondary | ICD-10-CM | POA: Diagnosis not present

## 2019-10-22 DIAGNOSIS — M65312 Trigger thumb, left thumb: Secondary | ICD-10-CM | POA: Diagnosis not present

## 2019-10-22 DIAGNOSIS — Z1331 Encounter for screening for depression: Secondary | ICD-10-CM | POA: Diagnosis not present

## 2019-10-22 DIAGNOSIS — R279 Unspecified lack of coordination: Secondary | ICD-10-CM | POA: Diagnosis not present

## 2019-10-23 DIAGNOSIS — U071 COVID-19: Secondary | ICD-10-CM | POA: Diagnosis not present

## 2019-10-23 DIAGNOSIS — I2694 Multiple subsegmental pulmonary emboli without acute cor pulmonale: Secondary | ICD-10-CM | POA: Diagnosis not present

## 2019-10-23 DIAGNOSIS — Z8701 Personal history of pneumonia (recurrent): Secondary | ICD-10-CM | POA: Diagnosis not present

## 2019-10-23 DIAGNOSIS — M1990 Unspecified osteoarthritis, unspecified site: Secondary | ICD-10-CM | POA: Diagnosis not present

## 2019-10-23 DIAGNOSIS — Z6830 Body mass index (BMI) 30.0-30.9, adult: Secondary | ICD-10-CM | POA: Diagnosis not present

## 2019-10-23 DIAGNOSIS — R569 Unspecified convulsions: Secondary | ICD-10-CM | POA: Diagnosis not present

## 2019-10-23 DIAGNOSIS — E876 Hypokalemia: Secondary | ICD-10-CM | POA: Diagnosis not present

## 2019-10-23 DIAGNOSIS — Z885 Allergy status to narcotic agent status: Secondary | ICD-10-CM | POA: Diagnosis not present

## 2019-10-23 DIAGNOSIS — Z66 Do not resuscitate: Secondary | ICD-10-CM | POA: Diagnosis not present

## 2019-10-23 DIAGNOSIS — E785 Hyperlipidemia, unspecified: Secondary | ICD-10-CM | POA: Diagnosis not present

## 2019-10-23 DIAGNOSIS — J1289 Other viral pneumonia: Secondary | ICD-10-CM | POA: Diagnosis not present

## 2019-10-23 DIAGNOSIS — Z79890 Hormone replacement therapy (postmenopausal): Secondary | ICD-10-CM | POA: Diagnosis not present

## 2019-10-23 DIAGNOSIS — E669 Obesity, unspecified: Secondary | ICD-10-CM | POA: Diagnosis not present

## 2019-10-23 DIAGNOSIS — E039 Hypothyroidism, unspecified: Secondary | ICD-10-CM | POA: Diagnosis not present

## 2019-10-23 DIAGNOSIS — Z79899 Other long term (current) drug therapy: Secondary | ICD-10-CM | POA: Diagnosis not present

## 2019-10-23 DIAGNOSIS — J9601 Acute respiratory failure with hypoxia: Secondary | ICD-10-CM | POA: Diagnosis not present

## 2019-10-23 DIAGNOSIS — I119 Hypertensive heart disease without heart failure: Secondary | ICD-10-CM | POA: Diagnosis not present

## 2019-10-24 DIAGNOSIS — I130 Hypertensive heart and chronic kidney disease with heart failure and stage 1 through stage 4 chronic kidney disease, or unspecified chronic kidney disease: Secondary | ICD-10-CM | POA: Diagnosis not present

## 2019-10-24 DIAGNOSIS — J1289 Other viral pneumonia: Secondary | ICD-10-CM | POA: Diagnosis not present

## 2019-10-24 DIAGNOSIS — H5704 Mydriasis: Secondary | ICD-10-CM | POA: Diagnosis not present

## 2019-10-24 DIAGNOSIS — Z9181 History of falling: Secondary | ICD-10-CM | POA: Diagnosis not present

## 2019-10-24 DIAGNOSIS — U071 COVID-19: Secondary | ICD-10-CM | POA: Diagnosis not present

## 2019-10-24 DIAGNOSIS — I480 Paroxysmal atrial fibrillation: Secondary | ICD-10-CM | POA: Diagnosis not present

## 2019-10-24 DIAGNOSIS — Z79891 Long term (current) use of opiate analgesic: Secondary | ICD-10-CM | POA: Diagnosis not present

## 2019-10-24 DIAGNOSIS — J9621 Acute and chronic respiratory failure with hypoxia: Secondary | ICD-10-CM | POA: Diagnosis not present

## 2019-10-24 DIAGNOSIS — E038 Other specified hypothyroidism: Secondary | ICD-10-CM | POA: Diagnosis not present

## 2019-10-24 DIAGNOSIS — I2510 Atherosclerotic heart disease of native coronary artery without angina pectoris: Secondary | ICD-10-CM | POA: Diagnosis not present

## 2019-10-24 DIAGNOSIS — I509 Heart failure, unspecified: Secondary | ICD-10-CM | POA: Diagnosis not present

## 2019-10-24 DIAGNOSIS — G8929 Other chronic pain: Secondary | ICD-10-CM | POA: Diagnosis not present

## 2019-10-24 DIAGNOSIS — J449 Chronic obstructive pulmonary disease, unspecified: Secondary | ICD-10-CM | POA: Diagnosis not present

## 2019-10-24 DIAGNOSIS — M6281 Muscle weakness (generalized): Secondary | ICD-10-CM | POA: Diagnosis not present

## 2019-10-24 DIAGNOSIS — M1A30X0 Chronic gout due to renal impairment, unspecified site, without tophus (tophi): Secondary | ICD-10-CM | POA: Diagnosis not present

## 2019-10-24 DIAGNOSIS — N1832 Chronic kidney disease, stage 3b: Secondary | ICD-10-CM | POA: Diagnosis not present

## 2019-10-24 DIAGNOSIS — D631 Anemia in chronic kidney disease: Secondary | ICD-10-CM | POA: Diagnosis not present

## 2019-10-24 DIAGNOSIS — I5032 Chronic diastolic (congestive) heart failure: Secondary | ICD-10-CM | POA: Diagnosis not present

## 2019-10-24 DIAGNOSIS — E1122 Type 2 diabetes mellitus with diabetic chronic kidney disease: Secondary | ICD-10-CM | POA: Diagnosis not present

## 2019-10-24 DIAGNOSIS — R270 Ataxia, unspecified: Secondary | ICD-10-CM | POA: Diagnosis not present

## 2019-10-24 DIAGNOSIS — Z791 Long term (current) use of non-steroidal anti-inflammatories (NSAID): Secondary | ICD-10-CM | POA: Diagnosis not present

## 2019-10-24 DIAGNOSIS — E1151 Type 2 diabetes mellitus with diabetic peripheral angiopathy without gangrene: Secondary | ICD-10-CM | POA: Diagnosis not present

## 2019-10-24 DIAGNOSIS — J439 Emphysema, unspecified: Secondary | ICD-10-CM | POA: Diagnosis not present

## 2019-10-24 DIAGNOSIS — N183 Chronic kidney disease, stage 3 unspecified: Secondary | ICD-10-CM | POA: Diagnosis not present

## 2019-10-24 DIAGNOSIS — M810 Age-related osteoporosis without current pathological fracture: Secondary | ICD-10-CM | POA: Diagnosis not present

## 2019-10-25 DIAGNOSIS — G40009 Localization-related (focal) (partial) idiopathic epilepsy and epileptic syndromes with seizures of localized onset, not intractable, without status epilepticus: Secondary | ICD-10-CM | POA: Diagnosis not present

## 2019-10-25 DIAGNOSIS — E785 Hyperlipidemia, unspecified: Secondary | ICD-10-CM | POA: Diagnosis not present

## 2019-10-25 DIAGNOSIS — I350 Nonrheumatic aortic (valve) stenosis: Secondary | ICD-10-CM | POA: Diagnosis not present

## 2019-10-25 DIAGNOSIS — E039 Hypothyroidism, unspecified: Secondary | ICD-10-CM | POA: Diagnosis not present

## 2019-10-25 DIAGNOSIS — F329 Major depressive disorder, single episode, unspecified: Secondary | ICD-10-CM | POA: Diagnosis not present

## 2019-10-25 DIAGNOSIS — K219 Gastro-esophageal reflux disease without esophagitis: Secondary | ICD-10-CM | POA: Diagnosis not present

## 2019-10-25 DIAGNOSIS — I509 Heart failure, unspecified: Secondary | ICD-10-CM | POA: Diagnosis not present

## 2019-10-25 DIAGNOSIS — G5701 Lesion of sciatic nerve, right lower limb: Secondary | ICD-10-CM | POA: Diagnosis not present

## 2019-10-25 DIAGNOSIS — Z48815 Encounter for surgical aftercare following surgery on the digestive system: Secondary | ICD-10-CM | POA: Diagnosis not present

## 2019-10-25 DIAGNOSIS — I130 Hypertensive heart and chronic kidney disease with heart failure and stage 1 through stage 4 chronic kidney disease, or unspecified chronic kidney disease: Secondary | ICD-10-CM | POA: Diagnosis not present

## 2019-10-25 DIAGNOSIS — M1990 Unspecified osteoarthritis, unspecified site: Secondary | ICD-10-CM | POA: Diagnosis not present

## 2019-10-25 DIAGNOSIS — K5651 Intestinal adhesions [bands], with partial obstruction: Secondary | ICD-10-CM | POA: Diagnosis not present

## 2019-10-25 DIAGNOSIS — D509 Iron deficiency anemia, unspecified: Secondary | ICD-10-CM | POA: Diagnosis not present

## 2019-10-25 DIAGNOSIS — Z452 Encounter for adjustment and management of vascular access device: Secondary | ICD-10-CM | POA: Diagnosis not present

## 2019-10-25 DIAGNOSIS — Z7984 Long term (current) use of oral hypoglycemic drugs: Secondary | ICD-10-CM | POA: Diagnosis not present

## 2019-10-25 DIAGNOSIS — M150 Primary generalized (osteo)arthritis: Secondary | ICD-10-CM | POA: Diagnosis not present

## 2019-10-25 DIAGNOSIS — N179 Acute kidney failure, unspecified: Secondary | ICD-10-CM | POA: Diagnosis not present

## 2019-10-25 DIAGNOSIS — Z9181 History of falling: Secondary | ICD-10-CM | POA: Diagnosis not present

## 2019-10-25 DIAGNOSIS — I69351 Hemiplegia and hemiparesis following cerebral infarction affecting right dominant side: Secondary | ICD-10-CM | POA: Diagnosis not present

## 2019-10-25 DIAGNOSIS — R4182 Altered mental status, unspecified: Secondary | ICD-10-CM | POA: Diagnosis not present

## 2019-10-25 DIAGNOSIS — I129 Hypertensive chronic kidney disease with stage 1 through stage 4 chronic kidney disease, or unspecified chronic kidney disease: Secondary | ICD-10-CM | POA: Diagnosis not present

## 2019-10-25 DIAGNOSIS — R471 Dysarthria and anarthria: Secondary | ICD-10-CM | POA: Diagnosis not present

## 2019-10-25 DIAGNOSIS — Z79891 Long term (current) use of opiate analgesic: Secondary | ICD-10-CM | POA: Diagnosis not present

## 2019-10-25 DIAGNOSIS — Z7982 Long term (current) use of aspirin: Secondary | ICD-10-CM | POA: Diagnosis not present

## 2019-10-25 DIAGNOSIS — I2510 Atherosclerotic heart disease of native coronary artery without angina pectoris: Secondary | ICD-10-CM | POA: Diagnosis not present

## 2019-10-25 DIAGNOSIS — B9561 Methicillin susceptible Staphylococcus aureus infection as the cause of diseases classified elsewhere: Secondary | ICD-10-CM | POA: Diagnosis not present

## 2019-10-25 DIAGNOSIS — I252 Old myocardial infarction: Secondary | ICD-10-CM | POA: Diagnosis not present

## 2019-10-25 DIAGNOSIS — M797 Fibromyalgia: Secondary | ICD-10-CM | POA: Diagnosis not present

## 2019-10-25 DIAGNOSIS — R7881 Bacteremia: Secondary | ICD-10-CM | POA: Diagnosis not present

## 2019-10-25 DIAGNOSIS — E1122 Type 2 diabetes mellitus with diabetic chronic kidney disease: Secondary | ICD-10-CM | POA: Diagnosis not present

## 2019-10-25 DIAGNOSIS — E7800 Pure hypercholesterolemia, unspecified: Secondary | ICD-10-CM | POA: Diagnosis not present

## 2019-10-25 DIAGNOSIS — Z03818 Encounter for observation for suspected exposure to other biological agents ruled out: Secondary | ICD-10-CM | POA: Diagnosis not present

## 2019-10-25 DIAGNOSIS — N182 Chronic kidney disease, stage 2 (mild): Secondary | ICD-10-CM | POA: Diagnosis not present

## 2019-10-25 DIAGNOSIS — E119 Type 2 diabetes mellitus without complications: Secondary | ICD-10-CM | POA: Diagnosis not present

## 2019-10-25 DIAGNOSIS — Z792 Long term (current) use of antibiotics: Secondary | ICD-10-CM | POA: Diagnosis not present

## 2019-10-25 DIAGNOSIS — Z20828 Contact with and (suspected) exposure to other viral communicable diseases: Secondary | ICD-10-CM | POA: Diagnosis not present

## 2019-10-25 DIAGNOSIS — N189 Chronic kidney disease, unspecified: Secondary | ICD-10-CM | POA: Diagnosis not present

## 2019-10-25 DIAGNOSIS — K279 Peptic ulcer, site unspecified, unspecified as acute or chronic, without hemorrhage or perforation: Secondary | ICD-10-CM | POA: Diagnosis not present

## 2019-10-25 DIAGNOSIS — I723 Aneurysm of iliac artery: Secondary | ICD-10-CM | POA: Diagnosis not present

## 2019-10-25 DIAGNOSIS — F419 Anxiety disorder, unspecified: Secondary | ICD-10-CM | POA: Diagnosis not present

## 2019-10-25 DIAGNOSIS — N261 Atrophy of kidney (terminal): Secondary | ICD-10-CM | POA: Diagnosis not present

## 2019-10-25 DIAGNOSIS — E1142 Type 2 diabetes mellitus with diabetic polyneuropathy: Secondary | ICD-10-CM | POA: Diagnosis not present

## 2019-10-25 DIAGNOSIS — E1151 Type 2 diabetes mellitus with diabetic peripheral angiopathy without gangrene: Secondary | ICD-10-CM | POA: Diagnosis not present

## 2019-10-25 DIAGNOSIS — I714 Abdominal aortic aneurysm, without rupture: Secondary | ICD-10-CM | POA: Diagnosis not present

## 2019-10-25 DIAGNOSIS — M5116 Intervertebral disc disorders with radiculopathy, lumbar region: Secondary | ICD-10-CM | POA: Diagnosis not present

## 2019-10-25 DIAGNOSIS — K8020 Calculus of gallbladder without cholecystitis without obstruction: Secondary | ICD-10-CM | POA: Diagnosis not present

## 2019-10-25 DIAGNOSIS — R509 Fever, unspecified: Secondary | ICD-10-CM | POA: Diagnosis not present

## 2019-10-25 DIAGNOSIS — Z96651 Presence of right artificial knee joint: Secondary | ICD-10-CM | POA: Diagnosis not present

## 2019-10-25 DIAGNOSIS — I10 Essential (primary) hypertension: Secondary | ICD-10-CM | POA: Diagnosis not present

## 2019-10-26 DIAGNOSIS — E1121 Type 2 diabetes mellitus with diabetic nephropathy: Secondary | ICD-10-CM | POA: Diagnosis not present

## 2019-10-26 DIAGNOSIS — J470 Bronchiectasis with acute lower respiratory infection: Secondary | ICD-10-CM | POA: Diagnosis not present

## 2019-10-26 DIAGNOSIS — J439 Emphysema, unspecified: Secondary | ICD-10-CM | POA: Diagnosis not present

## 2019-10-26 DIAGNOSIS — R278 Other lack of coordination: Secondary | ICD-10-CM | POA: Diagnosis not present

## 2019-10-26 DIAGNOSIS — M129 Arthropathy, unspecified: Secondary | ICD-10-CM | POA: Diagnosis not present

## 2019-10-26 DIAGNOSIS — Z1331 Encounter for screening for depression: Secondary | ICD-10-CM | POA: Diagnosis not present

## 2019-10-26 DIAGNOSIS — I10 Essential (primary) hypertension: Secondary | ICD-10-CM | POA: Diagnosis not present

## 2019-10-26 DIAGNOSIS — I4821 Permanent atrial fibrillation: Secondary | ICD-10-CM | POA: Diagnosis not present

## 2019-10-26 DIAGNOSIS — R1013 Epigastric pain: Secondary | ICD-10-CM | POA: Diagnosis not present

## 2019-10-26 DIAGNOSIS — R188 Other ascites: Secondary | ICD-10-CM | POA: Diagnosis not present

## 2019-10-26 DIAGNOSIS — F419 Anxiety disorder, unspecified: Secondary | ICD-10-CM | POA: Diagnosis not present

## 2019-10-26 DIAGNOSIS — J45909 Unspecified asthma, uncomplicated: Secondary | ICD-10-CM | POA: Diagnosis not present

## 2019-10-26 DIAGNOSIS — Z20828 Contact with and (suspected) exposure to other viral communicable diseases: Secondary | ICD-10-CM | POA: Diagnosis not present

## 2019-10-26 DIAGNOSIS — K7469 Other cirrhosis of liver: Secondary | ICD-10-CM | POA: Diagnosis not present

## 2019-10-26 DIAGNOSIS — J80 Acute respiratory distress syndrome: Secondary | ICD-10-CM | POA: Diagnosis not present

## 2019-10-26 DIAGNOSIS — L03116 Cellulitis of left lower limb: Secondary | ICD-10-CM | POA: Diagnosis not present

## 2019-10-26 DIAGNOSIS — I4891 Unspecified atrial fibrillation: Secondary | ICD-10-CM | POA: Diagnosis not present

## 2019-10-26 DIAGNOSIS — G8929 Other chronic pain: Secondary | ICD-10-CM | POA: Diagnosis not present

## 2019-10-26 DIAGNOSIS — Z7952 Long term (current) use of systemic steroids: Secondary | ICD-10-CM | POA: Diagnosis not present

## 2019-10-26 DIAGNOSIS — Z96651 Presence of right artificial knee joint: Secondary | ICD-10-CM | POA: Diagnosis not present

## 2019-10-26 DIAGNOSIS — Z1159 Encounter for screening for other viral diseases: Secondary | ICD-10-CM | POA: Diagnosis not present

## 2019-10-26 DIAGNOSIS — E876 Hypokalemia: Secondary | ICD-10-CM | POA: Diagnosis not present

## 2019-10-26 DIAGNOSIS — E7849 Other hyperlipidemia: Secondary | ICD-10-CM | POA: Diagnosis not present

## 2019-10-26 DIAGNOSIS — C189 Malignant neoplasm of colon, unspecified: Secondary | ICD-10-CM | POA: Diagnosis not present

## 2019-10-26 DIAGNOSIS — Z9181 History of falling: Secondary | ICD-10-CM | POA: Diagnosis not present

## 2019-10-26 DIAGNOSIS — S32010A Wedge compression fracture of first lumbar vertebra, initial encounter for closed fracture: Secondary | ICD-10-CM | POA: Diagnosis not present

## 2019-10-26 DIAGNOSIS — E871 Hypo-osmolality and hyponatremia: Secondary | ICD-10-CM | POA: Diagnosis not present

## 2019-10-26 DIAGNOSIS — E538 Deficiency of other specified B group vitamins: Secondary | ICD-10-CM | POA: Diagnosis not present

## 2019-10-26 DIAGNOSIS — Z9981 Dependence on supplemental oxygen: Secondary | ICD-10-CM | POA: Diagnosis not present

## 2019-10-26 DIAGNOSIS — J189 Pneumonia, unspecified organism: Secondary | ICD-10-CM | POA: Diagnosis not present

## 2019-10-26 DIAGNOSIS — I5022 Chronic systolic (congestive) heart failure: Secondary | ICD-10-CM | POA: Diagnosis not present

## 2019-10-26 DIAGNOSIS — E7800 Pure hypercholesterolemia, unspecified: Secondary | ICD-10-CM | POA: Diagnosis not present

## 2019-10-26 DIAGNOSIS — I110 Hypertensive heart disease with heart failure: Secondary | ICD-10-CM | POA: Diagnosis not present

## 2019-10-26 DIAGNOSIS — Z1339 Encounter for screening examination for other mental health and behavioral disorders: Secondary | ICD-10-CM | POA: Diagnosis not present

## 2019-10-26 DIAGNOSIS — Z79899 Other long term (current) drug therapy: Secondary | ICD-10-CM | POA: Diagnosis not present

## 2019-10-26 DIAGNOSIS — N184 Chronic kidney disease, stage 4 (severe): Secondary | ICD-10-CM | POA: Diagnosis not present

## 2019-10-26 DIAGNOSIS — M5136 Other intervertebral disc degeneration, lumbar region: Secondary | ICD-10-CM | POA: Diagnosis not present

## 2019-10-26 DIAGNOSIS — I509 Heart failure, unspecified: Secondary | ICD-10-CM | POA: Diagnosis not present

## 2019-10-26 DIAGNOSIS — D631 Anemia in chronic kidney disease: Secondary | ICD-10-CM | POA: Diagnosis not present

## 2019-10-26 DIAGNOSIS — J1289 Other viral pneumonia: Secondary | ICD-10-CM | POA: Diagnosis not present

## 2019-10-26 DIAGNOSIS — I129 Hypertensive chronic kidney disease with stage 1 through stage 4 chronic kidney disease, or unspecified chronic kidney disease: Secondary | ICD-10-CM | POA: Diagnosis not present

## 2019-10-26 DIAGNOSIS — I2510 Atherosclerotic heart disease of native coronary artery without angina pectoris: Secondary | ICD-10-CM | POA: Diagnosis not present

## 2019-10-26 DIAGNOSIS — I252 Old myocardial infarction: Secondary | ICD-10-CM | POA: Diagnosis not present

## 2019-10-26 DIAGNOSIS — E1142 Type 2 diabetes mellitus with diabetic polyneuropathy: Secondary | ICD-10-CM | POA: Diagnosis not present

## 2019-10-26 DIAGNOSIS — Z794 Long term (current) use of insulin: Secondary | ICD-10-CM | POA: Diagnosis not present

## 2019-10-26 DIAGNOSIS — J8410 Pulmonary fibrosis, unspecified: Secondary | ICD-10-CM | POA: Diagnosis not present

## 2019-10-26 DIAGNOSIS — Z96652 Presence of left artificial knee joint: Secondary | ICD-10-CM | POA: Diagnosis not present

## 2019-10-26 DIAGNOSIS — J849 Interstitial pulmonary disease, unspecified: Secondary | ICD-10-CM | POA: Diagnosis not present

## 2019-10-26 DIAGNOSIS — I495 Sick sinus syndrome: Secondary | ICD-10-CM | POA: Diagnosis not present

## 2019-10-26 DIAGNOSIS — L251 Unspecified contact dermatitis due to drugs in contact with skin: Secondary | ICD-10-CM | POA: Diagnosis not present

## 2019-10-26 DIAGNOSIS — M1712 Unilateral primary osteoarthritis, left knee: Secondary | ICD-10-CM | POA: Diagnosis not present

## 2019-10-26 DIAGNOSIS — I428 Other cardiomyopathies: Secondary | ICD-10-CM | POA: Diagnosis not present

## 2019-10-26 DIAGNOSIS — E785 Hyperlipidemia, unspecified: Secondary | ICD-10-CM | POA: Diagnosis not present

## 2019-10-26 DIAGNOSIS — Z7982 Long term (current) use of aspirin: Secondary | ICD-10-CM | POA: Diagnosis not present

## 2019-10-26 DIAGNOSIS — Z792 Long term (current) use of antibiotics: Secondary | ICD-10-CM | POA: Diagnosis not present

## 2019-10-26 DIAGNOSIS — U071 COVID-19: Secondary | ICD-10-CM | POA: Diagnosis not present

## 2019-10-26 DIAGNOSIS — I959 Hypotension, unspecified: Secondary | ICD-10-CM | POA: Diagnosis not present

## 2019-10-26 DIAGNOSIS — N1831 Chronic kidney disease, stage 3a: Secondary | ICD-10-CM | POA: Diagnosis not present

## 2019-10-26 DIAGNOSIS — Z299 Encounter for prophylactic measures, unspecified: Secondary | ICD-10-CM | POA: Diagnosis not present

## 2019-10-26 DIAGNOSIS — Z6825 Body mass index (BMI) 25.0-25.9, adult: Secondary | ICD-10-CM | POA: Diagnosis not present

## 2019-10-26 DIAGNOSIS — S91002D Unspecified open wound, left ankle, subsequent encounter: Secondary | ICD-10-CM | POA: Diagnosis not present

## 2019-10-26 DIAGNOSIS — Z800 Family history of malignant neoplasm of digestive organs: Secondary | ICD-10-CM | POA: Diagnosis not present

## 2019-10-26 DIAGNOSIS — E559 Vitamin D deficiency, unspecified: Secondary | ICD-10-CM | POA: Diagnosis not present

## 2019-10-26 DIAGNOSIS — N183 Chronic kidney disease, stage 3 unspecified: Secondary | ICD-10-CM | POA: Diagnosis not present

## 2019-10-26 DIAGNOSIS — N400 Enlarged prostate without lower urinary tract symptoms: Secondary | ICD-10-CM | POA: Diagnosis not present

## 2019-10-26 DIAGNOSIS — I214 Non-ST elevation (NSTEMI) myocardial infarction: Secondary | ICD-10-CM | POA: Diagnosis not present

## 2019-10-26 DIAGNOSIS — Z7189 Other specified counseling: Secondary | ICD-10-CM | POA: Diagnosis not present

## 2019-10-26 DIAGNOSIS — M1711 Unilateral primary osteoarthritis, right knee: Secondary | ICD-10-CM | POA: Diagnosis not present

## 2019-10-26 DIAGNOSIS — L7211 Pilar cyst: Secondary | ICD-10-CM | POA: Diagnosis not present

## 2019-10-26 DIAGNOSIS — K219 Gastro-esophageal reflux disease without esophagitis: Secondary | ICD-10-CM | POA: Diagnosis not present

## 2019-10-26 DIAGNOSIS — I130 Hypertensive heart and chronic kidney disease with heart failure and stage 1 through stage 4 chronic kidney disease, or unspecified chronic kidney disease: Secondary | ICD-10-CM | POA: Diagnosis not present

## 2019-10-26 DIAGNOSIS — E1122 Type 2 diabetes mellitus with diabetic chronic kidney disease: Secondary | ICD-10-CM | POA: Diagnosis not present

## 2019-10-26 DIAGNOSIS — Z0000 Encounter for general adult medical examination without abnormal findings: Secondary | ICD-10-CM | POA: Diagnosis not present

## 2019-10-26 DIAGNOSIS — D229 Melanocytic nevi, unspecified: Secondary | ICD-10-CM | POA: Diagnosis not present

## 2019-10-26 DIAGNOSIS — Z1211 Encounter for screening for malignant neoplasm of colon: Secondary | ICD-10-CM | POA: Diagnosis not present

## 2019-10-26 DIAGNOSIS — J918 Pleural effusion in other conditions classified elsewhere: Secondary | ICD-10-CM | POA: Diagnosis not present

## 2019-10-26 DIAGNOSIS — R1310 Dysphagia, unspecified: Secondary | ICD-10-CM | POA: Diagnosis not present

## 2019-10-26 DIAGNOSIS — Z5111 Encounter for antineoplastic chemotherapy: Secondary | ICD-10-CM | POA: Diagnosis not present

## 2019-10-26 DIAGNOSIS — L853 Xerosis cutis: Secondary | ICD-10-CM | POA: Diagnosis not present

## 2019-10-26 DIAGNOSIS — Z952 Presence of prosthetic heart valve: Secondary | ICD-10-CM | POA: Diagnosis not present

## 2019-10-26 DIAGNOSIS — E039 Hypothyroidism, unspecified: Secondary | ICD-10-CM | POA: Diagnosis not present

## 2019-10-26 DIAGNOSIS — M6281 Muscle weakness (generalized): Secondary | ICD-10-CM | POA: Diagnosis not present

## 2019-10-26 DIAGNOSIS — Z7901 Long term (current) use of anticoagulants: Secondary | ICD-10-CM | POA: Diagnosis not present

## 2019-10-26 DIAGNOSIS — L821 Other seborrheic keratosis: Secondary | ICD-10-CM | POA: Diagnosis not present

## 2019-10-26 DIAGNOSIS — J449 Chronic obstructive pulmonary disease, unspecified: Secondary | ICD-10-CM | POA: Diagnosis not present

## 2019-10-27 DIAGNOSIS — Z79899 Other long term (current) drug therapy: Secondary | ICD-10-CM | POA: Diagnosis not present

## 2019-10-27 DIAGNOSIS — E1122 Type 2 diabetes mellitus with diabetic chronic kidney disease: Secondary | ICD-10-CM | POA: Diagnosis not present

## 2019-10-27 DIAGNOSIS — K31819 Angiodysplasia of stomach and duodenum without bleeding: Secondary | ICD-10-CM | POA: Diagnosis not present

## 2019-10-27 DIAGNOSIS — E1140 Type 2 diabetes mellitus with diabetic neuropathy, unspecified: Secondary | ICD-10-CM | POA: Diagnosis not present

## 2019-10-27 DIAGNOSIS — M1030 Gout due to renal impairment, unspecified site: Secondary | ICD-10-CM | POA: Diagnosis not present

## 2019-10-27 DIAGNOSIS — I252 Old myocardial infarction: Secondary | ICD-10-CM | POA: Diagnosis not present

## 2019-10-27 DIAGNOSIS — I2510 Atherosclerotic heart disease of native coronary artery without angina pectoris: Secondary | ICD-10-CM | POA: Diagnosis not present

## 2019-10-27 DIAGNOSIS — E7800 Pure hypercholesterolemia, unspecified: Secondary | ICD-10-CM | POA: Diagnosis not present

## 2019-10-27 DIAGNOSIS — J449 Chronic obstructive pulmonary disease, unspecified: Secondary | ICD-10-CM | POA: Diagnosis not present

## 2019-10-27 DIAGNOSIS — M1990 Unspecified osteoarthritis, unspecified site: Secondary | ICD-10-CM | POA: Diagnosis not present

## 2019-10-27 DIAGNOSIS — M353 Polymyalgia rheumatica: Secondary | ICD-10-CM | POA: Diagnosis not present

## 2019-10-27 DIAGNOSIS — F322 Major depressive disorder, single episode, severe without psychotic features: Secondary | ICD-10-CM | POA: Diagnosis not present

## 2019-10-27 DIAGNOSIS — E785 Hyperlipidemia, unspecified: Secondary | ICD-10-CM | POA: Diagnosis not present

## 2019-10-27 DIAGNOSIS — F419 Anxiety disorder, unspecified: Secondary | ICD-10-CM | POA: Diagnosis not present

## 2019-10-27 DIAGNOSIS — E1159 Type 2 diabetes mellitus with other circulatory complications: Secondary | ICD-10-CM | POA: Diagnosis not present

## 2019-10-27 DIAGNOSIS — M1710 Unilateral primary osteoarthritis, unspecified knee: Secondary | ICD-10-CM | POA: Diagnosis not present

## 2019-10-27 DIAGNOSIS — G894 Chronic pain syndrome: Secondary | ICD-10-CM | POA: Diagnosis not present

## 2019-10-27 DIAGNOSIS — I10 Essential (primary) hypertension: Secondary | ICD-10-CM | POA: Diagnosis not present

## 2019-10-27 DIAGNOSIS — E039 Hypothyroidism, unspecified: Secondary | ICD-10-CM | POA: Diagnosis not present

## 2019-10-27 DIAGNOSIS — D61818 Other pancytopenia: Secondary | ICD-10-CM | POA: Diagnosis not present

## 2019-10-27 DIAGNOSIS — J690 Pneumonitis due to inhalation of food and vomit: Secondary | ICD-10-CM | POA: Diagnosis not present

## 2019-10-27 DIAGNOSIS — Z9181 History of falling: Secondary | ICD-10-CM | POA: Diagnosis not present

## 2019-10-27 DIAGNOSIS — E1169 Type 2 diabetes mellitus with other specified complication: Secondary | ICD-10-CM | POA: Diagnosis not present

## 2019-10-27 DIAGNOSIS — N184 Chronic kidney disease, stage 4 (severe): Secondary | ICD-10-CM | POA: Diagnosis not present

## 2019-10-27 DIAGNOSIS — R296 Repeated falls: Secondary | ICD-10-CM | POA: Diagnosis not present

## 2019-10-27 DIAGNOSIS — Z951 Presence of aortocoronary bypass graft: Secondary | ICD-10-CM | POA: Diagnosis not present

## 2019-10-27 DIAGNOSIS — I130 Hypertensive heart and chronic kidney disease with heart failure and stage 1 through stage 4 chronic kidney disease, or unspecified chronic kidney disease: Secondary | ICD-10-CM | POA: Diagnosis not present

## 2019-10-27 DIAGNOSIS — N183 Chronic kidney disease, stage 3 unspecified: Secondary | ICD-10-CM | POA: Diagnosis not present

## 2019-10-27 DIAGNOSIS — N289 Disorder of kidney and ureter, unspecified: Secondary | ICD-10-CM | POA: Diagnosis not present

## 2019-10-27 DIAGNOSIS — M810 Age-related osteoporosis without current pathological fracture: Secondary | ICD-10-CM | POA: Diagnosis not present

## 2019-10-27 DIAGNOSIS — Z794 Long term (current) use of insulin: Secondary | ICD-10-CM | POA: Diagnosis not present

## 2019-10-27 DIAGNOSIS — T8131XD Disruption of external operation (surgical) wound, not elsewhere classified, subsequent encounter: Secondary | ICD-10-CM | POA: Diagnosis not present

## 2019-10-27 DIAGNOSIS — Z1211 Encounter for screening for malignant neoplasm of colon: Secondary | ICD-10-CM | POA: Diagnosis not present

## 2019-10-27 DIAGNOSIS — M25551 Pain in right hip: Secondary | ICD-10-CM | POA: Diagnosis not present

## 2019-10-27 DIAGNOSIS — F0281 Dementia in other diseases classified elsewhere with behavioral disturbance: Secondary | ICD-10-CM | POA: Diagnosis not present

## 2019-10-27 DIAGNOSIS — J9601 Acute respiratory failure with hypoxia: Secondary | ICD-10-CM | POA: Diagnosis not present

## 2019-10-27 DIAGNOSIS — G20 Parkinson's disease: Secondary | ICD-10-CM | POA: Diagnosis not present

## 2019-10-27 DIAGNOSIS — Z125 Encounter for screening for malignant neoplasm of prostate: Secondary | ICD-10-CM | POA: Diagnosis not present

## 2019-10-27 DIAGNOSIS — K529 Noninfective gastroenteritis and colitis, unspecified: Secondary | ICD-10-CM | POA: Diagnosis not present

## 2019-10-27 DIAGNOSIS — I5032 Chronic diastolic (congestive) heart failure: Secondary | ICD-10-CM | POA: Diagnosis not present

## 2019-10-27 DIAGNOSIS — Z87891 Personal history of nicotine dependence: Secondary | ICD-10-CM | POA: Diagnosis not present

## 2019-10-27 DIAGNOSIS — E119 Type 2 diabetes mellitus without complications: Secondary | ICD-10-CM | POA: Diagnosis not present

## 2019-10-27 DIAGNOSIS — F329 Major depressive disorder, single episode, unspecified: Secondary | ICD-10-CM | POA: Diagnosis not present

## 2019-10-27 DIAGNOSIS — G4733 Obstructive sleep apnea (adult) (pediatric): Secondary | ICD-10-CM | POA: Diagnosis not present

## 2019-10-27 DIAGNOSIS — R441 Visual hallucinations: Secondary | ICD-10-CM | POA: Diagnosis not present

## 2019-10-27 DIAGNOSIS — Z86718 Personal history of other venous thrombosis and embolism: Secondary | ICD-10-CM | POA: Diagnosis not present

## 2019-10-27 DIAGNOSIS — Z96659 Presence of unspecified artificial knee joint: Secondary | ICD-10-CM | POA: Diagnosis not present

## 2019-10-27 DIAGNOSIS — S21209A Unspecified open wound of unspecified back wall of thorax without penetration into thoracic cavity, initial encounter: Secondary | ICD-10-CM | POA: Diagnosis not present

## 2019-10-28 DIAGNOSIS — R05 Cough: Secondary | ICD-10-CM | POA: Diagnosis not present

## 2019-10-28 DIAGNOSIS — R0982 Postnasal drip: Secondary | ICD-10-CM | POA: Diagnosis not present

## 2019-10-28 DIAGNOSIS — Z7189 Other specified counseling: Secondary | ICD-10-CM | POA: Diagnosis not present

## 2019-10-28 DIAGNOSIS — D649 Anemia, unspecified: Secondary | ICD-10-CM | POA: Diagnosis not present

## 2019-10-28 DIAGNOSIS — L400 Psoriasis vulgaris: Secondary | ICD-10-CM | POA: Diagnosis not present

## 2019-10-28 DIAGNOSIS — K219 Gastro-esophageal reflux disease without esophagitis: Secondary | ICD-10-CM | POA: Diagnosis not present

## 2019-10-28 DIAGNOSIS — Z6838 Body mass index (BMI) 38.0-38.9, adult: Secondary | ICD-10-CM | POA: Diagnosis not present

## 2019-10-28 DIAGNOSIS — N3946 Mixed incontinence: Secondary | ICD-10-CM | POA: Diagnosis not present

## 2019-10-28 DIAGNOSIS — G92 Toxic encephalopathy: Secondary | ICD-10-CM | POA: Diagnosis not present

## 2019-10-28 DIAGNOSIS — R300 Dysuria: Secondary | ICD-10-CM | POA: Diagnosis not present

## 2019-10-28 DIAGNOSIS — E7849 Other hyperlipidemia: Secondary | ICD-10-CM | POA: Diagnosis not present

## 2019-10-28 DIAGNOSIS — F419 Anxiety disorder, unspecified: Secondary | ICD-10-CM | POA: Diagnosis not present

## 2019-10-28 DIAGNOSIS — C678 Malignant neoplasm of overlapping sites of bladder: Secondary | ICD-10-CM | POA: Diagnosis not present

## 2019-10-28 DIAGNOSIS — J449 Chronic obstructive pulmonary disease, unspecified: Secondary | ICD-10-CM | POA: Diagnosis not present

## 2019-10-28 DIAGNOSIS — M25561 Pain in right knee: Secondary | ICD-10-CM | POA: Diagnosis not present

## 2019-10-28 DIAGNOSIS — Z1211 Encounter for screening for malignant neoplasm of colon: Secondary | ICD-10-CM | POA: Diagnosis not present

## 2019-10-28 DIAGNOSIS — I10 Essential (primary) hypertension: Secondary | ICD-10-CM | POA: Diagnosis not present

## 2019-10-28 DIAGNOSIS — R278 Other lack of coordination: Secondary | ICD-10-CM | POA: Diagnosis not present

## 2019-10-28 DIAGNOSIS — E119 Type 2 diabetes mellitus without complications: Secondary | ICD-10-CM | POA: Diagnosis not present

## 2019-10-28 DIAGNOSIS — R531 Weakness: Secondary | ICD-10-CM | POA: Diagnosis not present

## 2019-10-28 DIAGNOSIS — D509 Iron deficiency anemia, unspecified: Secondary | ICD-10-CM | POA: Diagnosis not present

## 2019-10-28 DIAGNOSIS — Z299 Encounter for prophylactic measures, unspecified: Secondary | ICD-10-CM | POA: Diagnosis not present

## 2019-10-28 DIAGNOSIS — M1711 Unilateral primary osteoarthritis, right knee: Secondary | ICD-10-CM | POA: Diagnosis not present

## 2019-10-28 DIAGNOSIS — D696 Thrombocytopenia, unspecified: Secondary | ICD-10-CM | POA: Diagnosis not present

## 2019-10-28 DIAGNOSIS — N390 Urinary tract infection, site not specified: Secondary | ICD-10-CM | POA: Diagnosis not present

## 2019-10-28 DIAGNOSIS — Z1331 Encounter for screening for depression: Secondary | ICD-10-CM | POA: Diagnosis not present

## 2019-10-28 DIAGNOSIS — U071 COVID-19: Secondary | ICD-10-CM | POA: Diagnosis not present

## 2019-10-28 DIAGNOSIS — I700 Atherosclerosis of aorta: Secondary | ICD-10-CM | POA: Diagnosis not present

## 2019-10-28 DIAGNOSIS — Z96651 Presence of right artificial knee joint: Secondary | ICD-10-CM | POA: Diagnosis not present

## 2019-10-28 DIAGNOSIS — M6281 Muscle weakness (generalized): Secondary | ICD-10-CM | POA: Diagnosis not present

## 2019-10-28 DIAGNOSIS — H40013 Open angle with borderline findings, low risk, bilateral: Secondary | ICD-10-CM | POA: Diagnosis not present

## 2019-10-28 DIAGNOSIS — E1142 Type 2 diabetes mellitus with diabetic polyneuropathy: Secondary | ICD-10-CM | POA: Diagnosis not present

## 2019-10-28 DIAGNOSIS — Z1339 Encounter for screening examination for other mental health and behavioral disorders: Secondary | ICD-10-CM | POA: Diagnosis not present

## 2019-10-28 DIAGNOSIS — J9601 Acute respiratory failure with hypoxia: Secondary | ICD-10-CM | POA: Diagnosis not present

## 2019-10-28 DIAGNOSIS — E1143 Type 2 diabetes mellitus with diabetic autonomic (poly)neuropathy: Secondary | ICD-10-CM | POA: Diagnosis not present

## 2019-10-28 DIAGNOSIS — K3184 Gastroparesis: Secondary | ICD-10-CM | POA: Diagnosis not present

## 2019-10-28 DIAGNOSIS — E1165 Type 2 diabetes mellitus with hyperglycemia: Secondary | ICD-10-CM | POA: Diagnosis not present

## 2019-10-28 DIAGNOSIS — Z0000 Encounter for general adult medical examination without abnormal findings: Secondary | ICD-10-CM | POA: Diagnosis not present

## 2019-10-28 DIAGNOSIS — I480 Paroxysmal atrial fibrillation: Secondary | ICD-10-CM | POA: Diagnosis not present

## 2019-10-28 DIAGNOSIS — E039 Hypothyroidism, unspecified: Secondary | ICD-10-CM | POA: Diagnosis not present

## 2019-10-28 DIAGNOSIS — N179 Acute kidney failure, unspecified: Secondary | ICD-10-CM | POA: Diagnosis not present

## 2019-10-29 DIAGNOSIS — M2042 Other hammer toe(s) (acquired), left foot: Secondary | ICD-10-CM | POA: Diagnosis not present

## 2019-10-29 DIAGNOSIS — N183 Chronic kidney disease, stage 3 unspecified: Secondary | ICD-10-CM | POA: Diagnosis not present

## 2019-10-29 DIAGNOSIS — E785 Hyperlipidemia, unspecified: Secondary | ICD-10-CM | POA: Diagnosis not present

## 2019-10-29 DIAGNOSIS — E871 Hypo-osmolality and hyponatremia: Secondary | ICD-10-CM | POA: Diagnosis not present

## 2019-10-29 DIAGNOSIS — G894 Chronic pain syndrome: Secondary | ICD-10-CM | POA: Diagnosis not present

## 2019-10-29 DIAGNOSIS — K2270 Barrett's esophagus without dysplasia: Secondary | ICD-10-CM | POA: Diagnosis not present

## 2019-10-29 DIAGNOSIS — I10 Essential (primary) hypertension: Secondary | ICD-10-CM | POA: Diagnosis not present

## 2019-10-29 DIAGNOSIS — E1100 Type 2 diabetes mellitus with hyperosmolarity without nonketotic hyperglycemic-hyperosmolar coma (NKHHC): Secondary | ICD-10-CM | POA: Diagnosis not present

## 2019-10-29 DIAGNOSIS — E1143 Type 2 diabetes mellitus with diabetic autonomic (poly)neuropathy: Secondary | ICD-10-CM | POA: Diagnosis not present

## 2019-10-29 DIAGNOSIS — I69344 Monoplegia of lower limb following cerebral infarction affecting left non-dominant side: Secondary | ICD-10-CM | POA: Diagnosis not present

## 2019-10-29 DIAGNOSIS — K3184 Gastroparesis: Secondary | ICD-10-CM | POA: Diagnosis not present

## 2019-10-29 DIAGNOSIS — Z87891 Personal history of nicotine dependence: Secondary | ICD-10-CM | POA: Diagnosis not present

## 2019-10-29 DIAGNOSIS — M170 Bilateral primary osteoarthritis of knee: Secondary | ICD-10-CM | POA: Diagnosis not present

## 2019-10-29 DIAGNOSIS — F411 Generalized anxiety disorder: Secondary | ICD-10-CM | POA: Diagnosis not present

## 2019-10-29 DIAGNOSIS — F17210 Nicotine dependence, cigarettes, uncomplicated: Secondary | ICD-10-CM | POA: Diagnosis not present

## 2019-10-29 DIAGNOSIS — M2041 Other hammer toe(s) (acquired), right foot: Secondary | ICD-10-CM | POA: Diagnosis not present

## 2019-10-29 DIAGNOSIS — I5023 Acute on chronic systolic (congestive) heart failure: Secondary | ICD-10-CM | POA: Diagnosis not present

## 2019-10-29 DIAGNOSIS — Z7982 Long term (current) use of aspirin: Secondary | ICD-10-CM | POA: Diagnosis not present

## 2019-10-29 DIAGNOSIS — K219 Gastro-esophageal reflux disease without esophagitis: Secondary | ICD-10-CM | POA: Diagnosis not present

## 2019-10-29 DIAGNOSIS — M542 Cervicalgia: Secondary | ICD-10-CM | POA: Diagnosis not present

## 2019-10-29 DIAGNOSIS — R42 Dizziness and giddiness: Secondary | ICD-10-CM | POA: Diagnosis not present

## 2019-10-29 DIAGNOSIS — H409 Unspecified glaucoma: Secondary | ICD-10-CM | POA: Diagnosis not present

## 2019-10-29 DIAGNOSIS — Z79899 Other long term (current) drug therapy: Secondary | ICD-10-CM | POA: Diagnosis not present

## 2019-10-29 DIAGNOSIS — E1142 Type 2 diabetes mellitus with diabetic polyneuropathy: Secondary | ICD-10-CM | POA: Diagnosis not present

## 2019-10-29 DIAGNOSIS — Z9181 History of falling: Secondary | ICD-10-CM | POA: Diagnosis not present

## 2019-10-29 DIAGNOSIS — F329 Major depressive disorder, single episode, unspecified: Secondary | ICD-10-CM | POA: Diagnosis not present

## 2019-10-29 DIAGNOSIS — Z1159 Encounter for screening for other viral diseases: Secondary | ICD-10-CM | POA: Diagnosis not present

## 2019-10-29 DIAGNOSIS — G8929 Other chronic pain: Secondary | ICD-10-CM | POA: Diagnosis not present

## 2019-10-29 DIAGNOSIS — K769 Liver disease, unspecified: Secondary | ICD-10-CM | POA: Diagnosis not present

## 2019-10-30 DIAGNOSIS — I10 Essential (primary) hypertension: Secondary | ICD-10-CM | POA: Diagnosis not present

## 2019-10-30 DIAGNOSIS — U071 COVID-19: Secondary | ICD-10-CM | POA: Diagnosis not present

## 2019-10-30 DIAGNOSIS — F419 Anxiety disorder, unspecified: Secondary | ICD-10-CM | POA: Diagnosis not present

## 2019-10-30 DIAGNOSIS — G2581 Restless legs syndrome: Secondary | ICD-10-CM | POA: Diagnosis not present

## 2019-10-30 DIAGNOSIS — M519 Unspecified thoracic, thoracolumbar and lumbosacral intervertebral disc disorder: Secondary | ICD-10-CM | POA: Diagnosis not present

## 2019-10-30 DIAGNOSIS — G4733 Obstructive sleep apnea (adult) (pediatric): Secondary | ICD-10-CM | POA: Diagnosis not present

## 2019-10-30 DIAGNOSIS — I69398 Other sequelae of cerebral infarction: Secondary | ICD-10-CM | POA: Diagnosis not present

## 2019-10-30 DIAGNOSIS — M2550 Pain in unspecified joint: Secondary | ICD-10-CM | POA: Diagnosis not present

## 2019-10-30 DIAGNOSIS — I5040 Unspecified combined systolic (congestive) and diastolic (congestive) heart failure: Secondary | ICD-10-CM | POA: Diagnosis not present

## 2019-10-30 DIAGNOSIS — G629 Polyneuropathy, unspecified: Secondary | ICD-10-CM | POA: Diagnosis not present

## 2019-10-30 DIAGNOSIS — J449 Chronic obstructive pulmonary disease, unspecified: Secondary | ICD-10-CM | POA: Diagnosis not present

## 2019-10-30 DIAGNOSIS — H9190 Unspecified hearing loss, unspecified ear: Secondary | ICD-10-CM | POA: Diagnosis not present

## 2019-10-30 DIAGNOSIS — Z7401 Bed confinement status: Secondary | ICD-10-CM | POA: Diagnosis not present

## 2019-10-30 DIAGNOSIS — Z20828 Contact with and (suspected) exposure to other viral communicable diseases: Secondary | ICD-10-CM | POA: Diagnosis not present

## 2019-10-30 DIAGNOSIS — R32 Unspecified urinary incontinence: Secondary | ICD-10-CM | POA: Diagnosis not present

## 2019-10-30 DIAGNOSIS — R519 Headache, unspecified: Secondary | ICD-10-CM | POA: Diagnosis not present

## 2019-10-30 DIAGNOSIS — I255 Ischemic cardiomyopathy: Secondary | ICD-10-CM | POA: Diagnosis not present

## 2019-10-30 DIAGNOSIS — N182 Chronic kidney disease, stage 2 (mild): Secondary | ICD-10-CM | POA: Diagnosis not present

## 2019-10-30 DIAGNOSIS — M8580 Other specified disorders of bone density and structure, unspecified site: Secondary | ICD-10-CM | POA: Diagnosis not present

## 2019-10-30 DIAGNOSIS — E785 Hyperlipidemia, unspecified: Secondary | ICD-10-CM | POA: Diagnosis not present

## 2019-10-30 DIAGNOSIS — I2510 Atherosclerotic heart disease of native coronary artery without angina pectoris: Secondary | ICD-10-CM | POA: Diagnosis not present

## 2019-10-30 DIAGNOSIS — K219 Gastro-esophageal reflux disease without esophagitis: Secondary | ICD-10-CM | POA: Diagnosis not present

## 2019-10-30 DIAGNOSIS — I469 Cardiac arrest, cause unspecified: Secondary | ICD-10-CM | POA: Diagnosis not present

## 2019-10-30 DIAGNOSIS — R1310 Dysphagia, unspecified: Secondary | ICD-10-CM | POA: Diagnosis not present

## 2019-10-30 DIAGNOSIS — I110 Hypertensive heart disease with heart failure: Secondary | ICD-10-CM | POA: Diagnosis not present

## 2019-10-30 DIAGNOSIS — R2689 Other abnormalities of gait and mobility: Secondary | ICD-10-CM | POA: Diagnosis not present

## 2019-10-30 DIAGNOSIS — I4891 Unspecified atrial fibrillation: Secondary | ICD-10-CM | POA: Diagnosis not present

## 2019-10-30 DIAGNOSIS — R296 Repeated falls: Secondary | ICD-10-CM | POA: Diagnosis not present

## 2019-10-30 DIAGNOSIS — R52 Pain, unspecified: Secondary | ICD-10-CM | POA: Diagnosis not present

## 2019-10-30 DIAGNOSIS — E782 Mixed hyperlipidemia: Secondary | ICD-10-CM | POA: Diagnosis not present

## 2019-10-30 DIAGNOSIS — E1122 Type 2 diabetes mellitus with diabetic chronic kidney disease: Secondary | ICD-10-CM | POA: Diagnosis not present

## 2019-10-30 DIAGNOSIS — F331 Major depressive disorder, recurrent, moderate: Secondary | ICD-10-CM | POA: Diagnosis not present

## 2019-10-31 DIAGNOSIS — N390 Urinary tract infection, site not specified: Secondary | ICD-10-CM | POA: Diagnosis not present

## 2019-10-31 DIAGNOSIS — R300 Dysuria: Secondary | ICD-10-CM | POA: Diagnosis not present

## 2019-11-01 DIAGNOSIS — I5032 Chronic diastolic (congestive) heart failure: Secondary | ICD-10-CM | POA: Diagnosis not present

## 2019-11-01 DIAGNOSIS — J449 Chronic obstructive pulmonary disease, unspecified: Secondary | ICD-10-CM | POA: Diagnosis not present

## 2019-11-01 DIAGNOSIS — E876 Hypokalemia: Secondary | ICD-10-CM | POA: Diagnosis not present

## 2019-11-01 DIAGNOSIS — Z7982 Long term (current) use of aspirin: Secondary | ICD-10-CM | POA: Diagnosis not present

## 2019-11-01 DIAGNOSIS — R319 Hematuria, unspecified: Secondary | ICD-10-CM | POA: Diagnosis not present

## 2019-11-01 DIAGNOSIS — S99921A Unspecified injury of right foot, initial encounter: Secondary | ICD-10-CM | POA: Diagnosis not present

## 2019-11-01 DIAGNOSIS — M5136 Other intervertebral disc degeneration, lumbar region: Secondary | ICD-10-CM | POA: Diagnosis not present

## 2019-11-01 DIAGNOSIS — I110 Hypertensive heart disease with heart failure: Secondary | ICD-10-CM | POA: Diagnosis not present

## 2019-11-01 DIAGNOSIS — R0602 Shortness of breath: Secondary | ICD-10-CM | POA: Diagnosis not present

## 2019-11-01 DIAGNOSIS — R05 Cough: Secondary | ICD-10-CM | POA: Diagnosis not present

## 2019-11-01 DIAGNOSIS — D649 Anemia, unspecified: Secondary | ICD-10-CM | POA: Diagnosis not present

## 2019-11-01 DIAGNOSIS — I10 Essential (primary) hypertension: Secondary | ICD-10-CM | POA: Diagnosis not present

## 2019-11-01 DIAGNOSIS — M2550 Pain in unspecified joint: Secondary | ICD-10-CM | POA: Diagnosis not present

## 2019-11-01 DIAGNOSIS — D320 Benign neoplasm of cerebral meninges: Secondary | ICD-10-CM | POA: Diagnosis not present

## 2019-11-01 DIAGNOSIS — J019 Acute sinusitis, unspecified: Secondary | ICD-10-CM | POA: Diagnosis not present

## 2019-11-01 DIAGNOSIS — Z79891 Long term (current) use of opiate analgesic: Secondary | ICD-10-CM | POA: Diagnosis not present

## 2019-11-01 DIAGNOSIS — D492 Neoplasm of unspecified behavior of bone, soft tissue, and skin: Secondary | ICD-10-CM | POA: Diagnosis not present

## 2019-11-01 DIAGNOSIS — R972 Elevated prostate specific antigen [PSA]: Secondary | ICD-10-CM | POA: Diagnosis not present

## 2019-11-01 DIAGNOSIS — E039 Hypothyroidism, unspecified: Secondary | ICD-10-CM | POA: Diagnosis not present

## 2019-11-01 DIAGNOSIS — G629 Polyneuropathy, unspecified: Secondary | ICD-10-CM | POA: Diagnosis not present

## 2019-11-01 DIAGNOSIS — C7A090 Malignant carcinoid tumor of the bronchus and lung: Secondary | ICD-10-CM | POA: Diagnosis not present

## 2019-11-01 DIAGNOSIS — Z9181 History of falling: Secondary | ICD-10-CM | POA: Diagnosis not present

## 2019-11-01 DIAGNOSIS — R5383 Other fatigue: Secondary | ICD-10-CM | POA: Diagnosis not present

## 2019-11-01 DIAGNOSIS — E7849 Other hyperlipidemia: Secondary | ICD-10-CM | POA: Diagnosis not present

## 2019-11-01 DIAGNOSIS — R3911 Hesitancy of micturition: Secondary | ICD-10-CM | POA: Diagnosis not present

## 2019-11-01 DIAGNOSIS — Z7952 Long term (current) use of systemic steroids: Secondary | ICD-10-CM | POA: Diagnosis not present

## 2019-11-01 DIAGNOSIS — N390 Urinary tract infection, site not specified: Secondary | ICD-10-CM | POA: Diagnosis not present

## 2019-11-01 DIAGNOSIS — Z79899 Other long term (current) drug therapy: Secondary | ICD-10-CM | POA: Diagnosis not present

## 2019-11-01 DIAGNOSIS — D696 Thrombocytopenia, unspecified: Secondary | ICD-10-CM | POA: Diagnosis not present

## 2019-11-01 DIAGNOSIS — Z87891 Personal history of nicotine dependence: Secondary | ICD-10-CM | POA: Diagnosis not present

## 2019-11-02 DIAGNOSIS — G459 Transient cerebral ischemic attack, unspecified: Secondary | ICD-10-CM | POA: Diagnosis not present

## 2019-11-02 DIAGNOSIS — M6281 Muscle weakness (generalized): Secondary | ICD-10-CM | POA: Diagnosis not present

## 2019-11-02 DIAGNOSIS — S2231XD Fracture of one rib, right side, subsequent encounter for fracture with routine healing: Secondary | ICD-10-CM | POA: Diagnosis not present

## 2019-11-02 DIAGNOSIS — I6529 Occlusion and stenosis of unspecified carotid artery: Secondary | ICD-10-CM | POA: Diagnosis not present

## 2019-11-02 DIAGNOSIS — F419 Anxiety disorder, unspecified: Secondary | ICD-10-CM | POA: Diagnosis not present

## 2019-11-02 DIAGNOSIS — Z96651 Presence of right artificial knee joint: Secondary | ICD-10-CM | POA: Diagnosis not present

## 2019-11-02 DIAGNOSIS — I10 Essential (primary) hypertension: Secondary | ICD-10-CM | POA: Diagnosis not present

## 2019-11-02 DIAGNOSIS — I4821 Permanent atrial fibrillation: Secondary | ICD-10-CM | POA: Diagnosis not present

## 2019-11-02 DIAGNOSIS — I2510 Atherosclerotic heart disease of native coronary artery without angina pectoris: Secondary | ICD-10-CM | POA: Diagnosis not present

## 2019-11-02 DIAGNOSIS — I442 Atrioventricular block, complete: Secondary | ICD-10-CM | POA: Diagnosis not present

## 2019-11-02 DIAGNOSIS — M1990 Unspecified osteoarthritis, unspecified site: Secondary | ICD-10-CM | POA: Diagnosis not present

## 2019-11-02 DIAGNOSIS — E1165 Type 2 diabetes mellitus with hyperglycemia: Secondary | ICD-10-CM | POA: Diagnosis not present

## 2019-11-02 DIAGNOSIS — J432 Centrilobular emphysema: Secondary | ICD-10-CM | POA: Diagnosis not present

## 2019-11-02 DIAGNOSIS — N401 Enlarged prostate with lower urinary tract symptoms: Secondary | ICD-10-CM | POA: Diagnosis not present

## 2019-11-02 DIAGNOSIS — E119 Type 2 diabetes mellitus without complications: Secondary | ICD-10-CM | POA: Diagnosis not present

## 2019-11-02 DIAGNOSIS — G250 Essential tremor: Secondary | ICD-10-CM | POA: Diagnosis not present

## 2019-11-02 DIAGNOSIS — M7702 Medial epicondylitis, left elbow: Secondary | ICD-10-CM | POA: Diagnosis not present

## 2019-11-02 DIAGNOSIS — K219 Gastro-esophageal reflux disease without esophagitis: Secondary | ICD-10-CM | POA: Diagnosis not present

## 2019-11-02 DIAGNOSIS — E079 Disorder of thyroid, unspecified: Secondary | ICD-10-CM | POA: Diagnosis not present

## 2019-11-02 DIAGNOSIS — R278 Other lack of coordination: Secondary | ICD-10-CM | POA: Diagnosis not present

## 2019-11-02 DIAGNOSIS — I110 Hypertensive heart disease with heart failure: Secondary | ICD-10-CM | POA: Diagnosis not present

## 2019-11-02 DIAGNOSIS — G5622 Lesion of ulnar nerve, left upper limb: Secondary | ICD-10-CM | POA: Diagnosis not present

## 2019-11-02 DIAGNOSIS — I2721 Secondary pulmonary arterial hypertension: Secondary | ICD-10-CM | POA: Diagnosis not present

## 2019-11-02 DIAGNOSIS — I482 Chronic atrial fibrillation, unspecified: Secondary | ICD-10-CM | POA: Diagnosis not present

## 2019-11-02 DIAGNOSIS — G4733 Obstructive sleep apnea (adult) (pediatric): Secondary | ICD-10-CM | POA: Diagnosis not present

## 2019-11-02 DIAGNOSIS — I5022 Chronic systolic (congestive) heart failure: Secondary | ICD-10-CM | POA: Diagnosis not present

## 2019-11-02 DIAGNOSIS — I700 Atherosclerosis of aorta: Secondary | ICD-10-CM | POA: Diagnosis not present

## 2019-11-02 DIAGNOSIS — E7849 Other hyperlipidemia: Secondary | ICD-10-CM | POA: Diagnosis not present

## 2019-11-02 DIAGNOSIS — M1711 Unilateral primary osteoarthritis, right knee: Secondary | ICD-10-CM | POA: Diagnosis not present

## 2019-11-02 DIAGNOSIS — I509 Heart failure, unspecified: Secondary | ICD-10-CM | POA: Diagnosis not present

## 2019-11-02 DIAGNOSIS — T827XXD Infection and inflammatory reaction due to other cardiac and vascular devices, implants and grafts, subsequent encounter: Secondary | ICD-10-CM | POA: Diagnosis not present

## 2019-11-02 DIAGNOSIS — I428 Other cardiomyopathies: Secondary | ICD-10-CM | POA: Diagnosis not present

## 2019-11-03 DIAGNOSIS — Z7189 Other specified counseling: Secondary | ICD-10-CM | POA: Diagnosis not present

## 2019-11-03 DIAGNOSIS — Z1331 Encounter for screening for depression: Secondary | ICD-10-CM | POA: Diagnosis not present

## 2019-11-03 DIAGNOSIS — R42 Dizziness and giddiness: Secondary | ICD-10-CM | POA: Diagnosis not present

## 2019-11-03 DIAGNOSIS — M1712 Unilateral primary osteoarthritis, left knee: Secondary | ICD-10-CM | POA: Diagnosis not present

## 2019-11-03 DIAGNOSIS — Z1211 Encounter for screening for malignant neoplasm of colon: Secondary | ICD-10-CM | POA: Diagnosis not present

## 2019-11-03 DIAGNOSIS — E559 Vitamin D deficiency, unspecified: Secondary | ICD-10-CM | POA: Diagnosis not present

## 2019-11-03 DIAGNOSIS — Z20828 Contact with and (suspected) exposure to other viral communicable diseases: Secondary | ICD-10-CM | POA: Diagnosis not present

## 2019-11-03 DIAGNOSIS — Z6841 Body Mass Index (BMI) 40.0 and over, adult: Secondary | ICD-10-CM | POA: Diagnosis not present

## 2019-11-03 DIAGNOSIS — E7800 Pure hypercholesterolemia, unspecified: Secondary | ICD-10-CM | POA: Diagnosis not present

## 2019-11-03 DIAGNOSIS — Z1339 Encounter for screening examination for other mental health and behavioral disorders: Secondary | ICD-10-CM | POA: Diagnosis not present

## 2019-11-03 DIAGNOSIS — R0789 Other chest pain: Secondary | ICD-10-CM | POA: Diagnosis not present

## 2019-11-03 DIAGNOSIS — Z299 Encounter for prophylactic measures, unspecified: Secondary | ICD-10-CM | POA: Diagnosis not present

## 2019-11-03 DIAGNOSIS — S299XXA Unspecified injury of thorax, initial encounter: Secondary | ICD-10-CM | POA: Diagnosis not present

## 2019-11-03 DIAGNOSIS — J449 Chronic obstructive pulmonary disease, unspecified: Secondary | ICD-10-CM | POA: Diagnosis not present

## 2019-11-03 DIAGNOSIS — C9001 Multiple myeloma in remission: Secondary | ICD-10-CM | POA: Diagnosis not present

## 2019-11-03 DIAGNOSIS — Z0000 Encounter for general adult medical examination without abnormal findings: Secondary | ICD-10-CM | POA: Diagnosis not present

## 2019-11-03 DIAGNOSIS — R5383 Other fatigue: Secondary | ICD-10-CM | POA: Diagnosis not present

## 2019-11-03 DIAGNOSIS — Z6828 Body mass index (BMI) 28.0-28.9, adult: Secondary | ICD-10-CM | POA: Diagnosis not present

## 2019-11-03 DIAGNOSIS — H9312 Tinnitus, left ear: Secondary | ICD-10-CM | POA: Diagnosis not present

## 2019-11-03 DIAGNOSIS — N186 End stage renal disease: Secondary | ICD-10-CM | POA: Diagnosis not present

## 2019-11-04 DIAGNOSIS — M79604 Pain in right leg: Secondary | ICD-10-CM | POA: Diagnosis not present

## 2019-11-04 DIAGNOSIS — M79605 Pain in left leg: Secondary | ICD-10-CM | POA: Diagnosis not present

## 2019-11-04 DIAGNOSIS — J1289 Other viral pneumonia: Secondary | ICD-10-CM | POA: Diagnosis not present

## 2019-11-04 DIAGNOSIS — I10 Essential (primary) hypertension: Secondary | ICD-10-CM | POA: Diagnosis not present

## 2019-11-04 DIAGNOSIS — R5383 Other fatigue: Secondary | ICD-10-CM | POA: Diagnosis not present

## 2019-11-04 DIAGNOSIS — Z7189 Other specified counseling: Secondary | ICD-10-CM | POA: Diagnosis not present

## 2019-11-04 DIAGNOSIS — E119 Type 2 diabetes mellitus without complications: Secondary | ICD-10-CM | POA: Diagnosis not present

## 2019-11-04 DIAGNOSIS — J309 Allergic rhinitis, unspecified: Secondary | ICD-10-CM | POA: Diagnosis not present

## 2019-11-04 DIAGNOSIS — R9431 Abnormal electrocardiogram [ECG] [EKG]: Secondary | ICD-10-CM | POA: Diagnosis not present

## 2019-11-04 DIAGNOSIS — K7689 Other specified diseases of liver: Secondary | ICD-10-CM | POA: Diagnosis not present

## 2019-11-04 DIAGNOSIS — I77810 Thoracic aortic ectasia: Secondary | ICD-10-CM | POA: Diagnosis not present

## 2019-11-04 DIAGNOSIS — Z79899 Other long term (current) drug therapy: Secondary | ICD-10-CM | POA: Diagnosis not present

## 2019-11-04 DIAGNOSIS — I252 Old myocardial infarction: Secondary | ICD-10-CM | POA: Diagnosis not present

## 2019-11-04 DIAGNOSIS — Z01810 Encounter for preprocedural cardiovascular examination: Secondary | ICD-10-CM | POA: Diagnosis not present

## 2019-11-04 DIAGNOSIS — Z1211 Encounter for screening for malignant neoplasm of colon: Secondary | ICD-10-CM | POA: Diagnosis not present

## 2019-11-04 DIAGNOSIS — R319 Hematuria, unspecified: Secondary | ICD-10-CM | POA: Diagnosis not present

## 2019-11-04 DIAGNOSIS — C44712 Basal cell carcinoma of skin of right lower limb, including hip: Secondary | ICD-10-CM | POA: Diagnosis not present

## 2019-11-04 DIAGNOSIS — M1711 Unilateral primary osteoarthritis, right knee: Secondary | ICD-10-CM | POA: Diagnosis not present

## 2019-11-04 DIAGNOSIS — I639 Cerebral infarction, unspecified: Secondary | ICD-10-CM | POA: Diagnosis not present

## 2019-11-04 DIAGNOSIS — F419 Anxiety disorder, unspecified: Secondary | ICD-10-CM | POA: Diagnosis not present

## 2019-11-04 DIAGNOSIS — C44719 Basal cell carcinoma of skin of left lower limb, including hip: Secondary | ICD-10-CM | POA: Diagnosis not present

## 2019-11-04 DIAGNOSIS — T8484XA Pain due to internal orthopedic prosthetic devices, implants and grafts, initial encounter: Secondary | ICD-10-CM | POA: Diagnosis not present

## 2019-11-04 DIAGNOSIS — C44519 Basal cell carcinoma of skin of other part of trunk: Secondary | ICD-10-CM | POA: Diagnosis not present

## 2019-11-04 DIAGNOSIS — E785 Hyperlipidemia, unspecified: Secondary | ICD-10-CM | POA: Diagnosis not present

## 2019-11-04 DIAGNOSIS — I5023 Acute on chronic systolic (congestive) heart failure: Secondary | ICD-10-CM | POA: Diagnosis not present

## 2019-11-04 DIAGNOSIS — I2510 Atherosclerotic heart disease of native coronary artery without angina pectoris: Secondary | ICD-10-CM | POA: Diagnosis not present

## 2019-11-04 DIAGNOSIS — R1030 Lower abdominal pain, unspecified: Secondary | ICD-10-CM | POA: Diagnosis not present

## 2019-11-04 DIAGNOSIS — Z1339 Encounter for screening examination for other mental health and behavioral disorders: Secondary | ICD-10-CM | POA: Diagnosis not present

## 2019-11-04 DIAGNOSIS — Z1331 Encounter for screening for depression: Secondary | ICD-10-CM | POA: Diagnosis not present

## 2019-11-04 DIAGNOSIS — Z20828 Contact with and (suspected) exposure to other viral communicable diseases: Secondary | ICD-10-CM | POA: Diagnosis not present

## 2019-11-04 DIAGNOSIS — E669 Obesity, unspecified: Secondary | ICD-10-CM | POA: Diagnosis not present

## 2019-11-04 DIAGNOSIS — Z9181 History of falling: Secondary | ICD-10-CM | POA: Diagnosis not present

## 2019-11-04 DIAGNOSIS — U071 COVID-19: Secondary | ICD-10-CM | POA: Diagnosis not present

## 2019-11-04 DIAGNOSIS — Z0000 Encounter for general adult medical examination without abnormal findings: Secondary | ICD-10-CM | POA: Diagnosis not present

## 2019-11-04 DIAGNOSIS — Z955 Presence of coronary angioplasty implant and graft: Secondary | ICD-10-CM | POA: Diagnosis not present

## 2019-11-04 DIAGNOSIS — M5030 Other cervical disc degeneration, unspecified cervical region: Secondary | ICD-10-CM | POA: Diagnosis not present

## 2019-11-04 DIAGNOSIS — I509 Heart failure, unspecified: Secondary | ICD-10-CM | POA: Diagnosis not present

## 2019-11-04 DIAGNOSIS — Z6830 Body mass index (BMI) 30.0-30.9, adult: Secondary | ICD-10-CM | POA: Diagnosis not present

## 2019-11-04 DIAGNOSIS — N390 Urinary tract infection, site not specified: Secondary | ICD-10-CM | POA: Diagnosis not present

## 2019-11-04 DIAGNOSIS — N179 Acute kidney failure, unspecified: Secondary | ICD-10-CM | POA: Diagnosis not present

## 2019-11-04 DIAGNOSIS — M545 Low back pain: Secondary | ICD-10-CM | POA: Diagnosis not present

## 2019-11-04 DIAGNOSIS — M519 Unspecified thoracic, thoracolumbar and lumbosacral intervertebral disc disorder: Secondary | ICD-10-CM | POA: Diagnosis not present

## 2019-11-04 DIAGNOSIS — A419 Sepsis, unspecified organism: Secondary | ICD-10-CM | POA: Diagnosis not present

## 2019-11-04 DIAGNOSIS — Z6827 Body mass index (BMI) 27.0-27.9, adult: Secondary | ICD-10-CM | POA: Diagnosis not present

## 2019-11-04 DIAGNOSIS — K449 Diaphragmatic hernia without obstruction or gangrene: Secondary | ICD-10-CM | POA: Diagnosis not present

## 2019-11-04 DIAGNOSIS — Z7952 Long term (current) use of systemic steroids: Secondary | ICD-10-CM | POA: Diagnosis not present

## 2019-11-04 DIAGNOSIS — Z01812 Encounter for preprocedural laboratory examination: Secondary | ICD-10-CM | POA: Diagnosis not present

## 2019-11-04 DIAGNOSIS — I779 Disorder of arteries and arterioles, unspecified: Secondary | ICD-10-CM | POA: Diagnosis not present

## 2019-11-04 DIAGNOSIS — Z299 Encounter for prophylactic measures, unspecified: Secondary | ICD-10-CM | POA: Diagnosis not present

## 2019-11-04 DIAGNOSIS — R350 Frequency of micturition: Secondary | ICD-10-CM | POA: Diagnosis not present

## 2019-11-04 DIAGNOSIS — R278 Other lack of coordination: Secondary | ICD-10-CM | POA: Diagnosis not present

## 2019-11-04 DIAGNOSIS — M6281 Muscle weakness (generalized): Secondary | ICD-10-CM | POA: Diagnosis not present

## 2019-11-04 DIAGNOSIS — I4891 Unspecified atrial fibrillation: Secondary | ICD-10-CM | POA: Diagnosis not present

## 2019-11-04 DIAGNOSIS — R102 Pelvic and perineal pain: Secondary | ICD-10-CM | POA: Diagnosis not present

## 2019-11-04 DIAGNOSIS — I739 Peripheral vascular disease, unspecified: Secondary | ICD-10-CM | POA: Diagnosis not present

## 2019-11-04 DIAGNOSIS — I700 Atherosclerosis of aorta: Secondary | ICD-10-CM | POA: Diagnosis not present

## 2019-11-04 DIAGNOSIS — Z96651 Presence of right artificial knee joint: Secondary | ICD-10-CM | POA: Diagnosis not present

## 2019-11-04 DIAGNOSIS — J9601 Acute respiratory failure with hypoxia: Secondary | ICD-10-CM | POA: Diagnosis not present

## 2019-11-04 DIAGNOSIS — Z6829 Body mass index (BMI) 29.0-29.9, adult: Secondary | ICD-10-CM | POA: Diagnosis not present

## 2019-11-05 DIAGNOSIS — Z7952 Long term (current) use of systemic steroids: Secondary | ICD-10-CM | POA: Diagnosis not present

## 2019-11-05 DIAGNOSIS — R279 Unspecified lack of coordination: Secondary | ICD-10-CM | POA: Diagnosis not present

## 2019-11-05 DIAGNOSIS — Z9981 Dependence on supplemental oxygen: Secondary | ICD-10-CM | POA: Diagnosis not present

## 2019-11-05 DIAGNOSIS — Z0000 Encounter for general adult medical examination without abnormal findings: Secondary | ICD-10-CM | POA: Diagnosis not present

## 2019-11-05 DIAGNOSIS — Z7984 Long term (current) use of oral hypoglycemic drugs: Secondary | ICD-10-CM | POA: Diagnosis not present

## 2019-11-05 DIAGNOSIS — Z743 Need for continuous supervision: Secondary | ICD-10-CM | POA: Diagnosis not present

## 2019-11-05 DIAGNOSIS — Z7982 Long term (current) use of aspirin: Secondary | ICD-10-CM | POA: Diagnosis not present

## 2019-11-05 DIAGNOSIS — Z9181 History of falling: Secondary | ICD-10-CM | POA: Diagnosis not present

## 2019-11-05 DIAGNOSIS — J1289 Other viral pneumonia: Secondary | ICD-10-CM | POA: Diagnosis not present

## 2019-11-05 DIAGNOSIS — J80 Acute respiratory distress syndrome: Secondary | ICD-10-CM | POA: Diagnosis not present

## 2019-11-05 DIAGNOSIS — I129 Hypertensive chronic kidney disease with stage 1 through stage 4 chronic kidney disease, or unspecified chronic kidney disease: Secondary | ICD-10-CM | POA: Diagnosis not present

## 2019-11-05 DIAGNOSIS — G7109 Other specified muscular dystrophies: Secondary | ICD-10-CM | POA: Diagnosis not present

## 2019-11-05 DIAGNOSIS — N400 Enlarged prostate without lower urinary tract symptoms: Secondary | ICD-10-CM | POA: Diagnosis not present

## 2019-11-05 DIAGNOSIS — R404 Transient alteration of awareness: Secondary | ICD-10-CM | POA: Diagnosis not present

## 2019-11-05 DIAGNOSIS — I509 Heart failure, unspecified: Secondary | ICD-10-CM | POA: Diagnosis not present

## 2019-11-05 DIAGNOSIS — N814 Uterovaginal prolapse, unspecified: Secondary | ICD-10-CM | POA: Diagnosis not present

## 2019-11-05 DIAGNOSIS — R942 Abnormal results of pulmonary function studies: Secondary | ICD-10-CM | POA: Diagnosis not present

## 2019-11-05 DIAGNOSIS — J8410 Pulmonary fibrosis, unspecified: Secondary | ICD-10-CM | POA: Diagnosis not present

## 2019-11-05 DIAGNOSIS — U071 COVID-19: Secondary | ICD-10-CM | POA: Diagnosis not present

## 2019-11-05 DIAGNOSIS — R296 Repeated falls: Secondary | ICD-10-CM | POA: Diagnosis not present

## 2019-11-05 DIAGNOSIS — N179 Acute kidney failure, unspecified: Secondary | ICD-10-CM | POA: Diagnosis not present

## 2019-11-05 DIAGNOSIS — J470 Bronchiectasis with acute lower respiratory infection: Secondary | ICD-10-CM | POA: Diagnosis not present

## 2019-11-05 DIAGNOSIS — E038 Other specified hypothyroidism: Secondary | ICD-10-CM | POA: Diagnosis not present

## 2019-11-05 DIAGNOSIS — Z1211 Encounter for screening for malignant neoplasm of colon: Secondary | ICD-10-CM | POA: Diagnosis not present

## 2019-11-05 DIAGNOSIS — Z7189 Other specified counseling: Secondary | ICD-10-CM | POA: Diagnosis not present

## 2019-11-05 DIAGNOSIS — Z792 Long term (current) use of antibiotics: Secondary | ICD-10-CM | POA: Diagnosis not present

## 2019-11-05 DIAGNOSIS — Z1389 Encounter for screening for other disorder: Secondary | ICD-10-CM | POA: Diagnosis not present

## 2019-11-05 DIAGNOSIS — D649 Anemia, unspecified: Secondary | ICD-10-CM | POA: Diagnosis not present

## 2019-11-05 DIAGNOSIS — I959 Hypotension, unspecified: Secondary | ICD-10-CM | POA: Diagnosis not present

## 2019-11-05 DIAGNOSIS — R0789 Other chest pain: Secondary | ICD-10-CM | POA: Diagnosis not present

## 2019-11-05 DIAGNOSIS — R5383 Other fatigue: Secondary | ICD-10-CM | POA: Diagnosis not present

## 2019-11-05 DIAGNOSIS — Z6839 Body mass index (BMI) 39.0-39.9, adult: Secondary | ICD-10-CM | POA: Diagnosis not present

## 2019-11-05 DIAGNOSIS — E1122 Type 2 diabetes mellitus with diabetic chronic kidney disease: Secondary | ICD-10-CM | POA: Diagnosis not present

## 2019-11-05 DIAGNOSIS — R6889 Other general symptoms and signs: Secondary | ICD-10-CM | POA: Diagnosis not present

## 2019-11-05 DIAGNOSIS — E119 Type 2 diabetes mellitus without complications: Secondary | ICD-10-CM | POA: Diagnosis not present

## 2019-11-05 DIAGNOSIS — N183 Chronic kidney disease, stage 3 unspecified: Secondary | ICD-10-CM | POA: Diagnosis not present

## 2019-11-05 DIAGNOSIS — I4891 Unspecified atrial fibrillation: Secondary | ICD-10-CM | POA: Diagnosis not present

## 2019-11-05 DIAGNOSIS — D279 Benign neoplasm of unspecified ovary: Secondary | ICD-10-CM | POA: Diagnosis not present

## 2019-11-05 DIAGNOSIS — E785 Hyperlipidemia, unspecified: Secondary | ICD-10-CM | POA: Diagnosis not present

## 2019-11-05 DIAGNOSIS — A419 Sepsis, unspecified organism: Secondary | ICD-10-CM | POA: Diagnosis not present

## 2019-11-05 DIAGNOSIS — Z1339 Encounter for screening examination for other mental health and behavioral disorders: Secondary | ICD-10-CM | POA: Diagnosis not present

## 2019-11-05 DIAGNOSIS — J439 Emphysema, unspecified: Secondary | ICD-10-CM | POA: Diagnosis not present

## 2019-11-05 DIAGNOSIS — Z794 Long term (current) use of insulin: Secondary | ICD-10-CM | POA: Diagnosis not present

## 2019-11-05 DIAGNOSIS — I10 Essential (primary) hypertension: Secondary | ICD-10-CM | POA: Diagnosis not present

## 2019-11-05 DIAGNOSIS — R8271 Bacteriuria: Secondary | ICD-10-CM | POA: Diagnosis not present

## 2019-11-05 DIAGNOSIS — G709 Myoneural disorder, unspecified: Secondary | ICD-10-CM | POA: Diagnosis not present

## 2019-11-05 DIAGNOSIS — I214 Non-ST elevation (NSTEMI) myocardial infarction: Secondary | ICD-10-CM | POA: Diagnosis not present

## 2019-11-05 DIAGNOSIS — N390 Urinary tract infection, site not specified: Secondary | ICD-10-CM | POA: Diagnosis not present

## 2019-11-05 DIAGNOSIS — I110 Hypertensive heart disease with heart failure: Secondary | ICD-10-CM | POA: Diagnosis not present

## 2019-11-05 DIAGNOSIS — S0003XD Contusion of scalp, subsequent encounter: Secondary | ICD-10-CM | POA: Diagnosis not present

## 2019-11-05 DIAGNOSIS — G4736 Sleep related hypoventilation in conditions classified elsewhere: Secondary | ICD-10-CM | POA: Diagnosis not present

## 2019-11-05 DIAGNOSIS — Z299 Encounter for prophylactic measures, unspecified: Secondary | ICD-10-CM | POA: Diagnosis not present

## 2019-11-05 DIAGNOSIS — Z7901 Long term (current) use of anticoagulants: Secondary | ICD-10-CM | POA: Diagnosis not present

## 2019-11-05 DIAGNOSIS — E1142 Type 2 diabetes mellitus with diabetic polyneuropathy: Secondary | ICD-10-CM | POA: Diagnosis not present

## 2019-11-05 DIAGNOSIS — E1165 Type 2 diabetes mellitus with hyperglycemia: Secondary | ICD-10-CM | POA: Diagnosis not present

## 2019-11-05 DIAGNOSIS — Z1331 Encounter for screening for depression: Secondary | ICD-10-CM | POA: Diagnosis not present

## 2019-11-05 DIAGNOSIS — K219 Gastro-esophageal reflux disease without esophagitis: Secondary | ICD-10-CM | POA: Diagnosis not present

## 2019-11-05 DIAGNOSIS — Z7689 Persons encountering health services in other specified circumstances: Secondary | ICD-10-CM | POA: Diagnosis not present

## 2019-11-05 DIAGNOSIS — Z6833 Body mass index (BMI) 33.0-33.9, adult: Secondary | ICD-10-CM | POA: Diagnosis not present

## 2019-11-05 DIAGNOSIS — E7800 Pure hypercholesterolemia, unspecified: Secondary | ICD-10-CM | POA: Diagnosis not present

## 2019-11-06 DIAGNOSIS — I4821 Permanent atrial fibrillation: Secondary | ICD-10-CM | POA: Diagnosis not present

## 2019-11-06 DIAGNOSIS — I10 Essential (primary) hypertension: Secondary | ICD-10-CM | POA: Diagnosis not present

## 2019-11-06 DIAGNOSIS — Z79899 Other long term (current) drug therapy: Secondary | ICD-10-CM | POA: Diagnosis not present

## 2019-11-06 DIAGNOSIS — M25561 Pain in right knee: Secondary | ICD-10-CM | POA: Diagnosis not present

## 2019-11-06 DIAGNOSIS — J449 Chronic obstructive pulmonary disease, unspecified: Secondary | ICD-10-CM | POA: Diagnosis not present

## 2019-11-06 DIAGNOSIS — N183 Chronic kidney disease, stage 3 unspecified: Secondary | ICD-10-CM | POA: Diagnosis not present

## 2019-11-06 DIAGNOSIS — Z888 Allergy status to other drugs, medicaments and biological substances status: Secondary | ICD-10-CM | POA: Diagnosis not present

## 2019-11-06 DIAGNOSIS — Z8543 Personal history of malignant neoplasm of ovary: Secondary | ICD-10-CM | POA: Diagnosis not present

## 2019-11-06 DIAGNOSIS — I129 Hypertensive chronic kidney disease with stage 1 through stage 4 chronic kidney disease, or unspecified chronic kidney disease: Secondary | ICD-10-CM | POA: Diagnosis not present

## 2019-11-06 DIAGNOSIS — N3289 Other specified disorders of bladder: Secondary | ICD-10-CM | POA: Diagnosis not present

## 2019-11-06 DIAGNOSIS — S2241XD Multiple fractures of ribs, right side, subsequent encounter for fracture with routine healing: Secondary | ICD-10-CM | POA: Diagnosis not present

## 2019-11-06 DIAGNOSIS — Z886 Allergy status to analgesic agent status: Secondary | ICD-10-CM | POA: Diagnosis not present

## 2019-11-06 DIAGNOSIS — Z79891 Long term (current) use of opiate analgesic: Secondary | ICD-10-CM | POA: Diagnosis not present

## 2019-11-06 DIAGNOSIS — E1165 Type 2 diabetes mellitus with hyperglycemia: Secondary | ICD-10-CM | POA: Diagnosis not present

## 2019-11-06 DIAGNOSIS — D461 Refractory anemia with ring sideroblasts: Secondary | ICD-10-CM | POA: Diagnosis not present

## 2019-11-06 DIAGNOSIS — Z7901 Long term (current) use of anticoagulants: Secondary | ICD-10-CM | POA: Diagnosis not present

## 2019-11-06 DIAGNOSIS — I350 Nonrheumatic aortic (valve) stenosis: Secondary | ICD-10-CM | POA: Diagnosis not present

## 2019-11-06 DIAGNOSIS — E039 Hypothyroidism, unspecified: Secondary | ICD-10-CM | POA: Diagnosis not present

## 2019-11-06 DIAGNOSIS — H3530 Unspecified macular degeneration: Secondary | ICD-10-CM | POA: Diagnosis not present

## 2019-11-06 DIAGNOSIS — I2723 Pulmonary hypertension due to lung diseases and hypoxia: Secondary | ICD-10-CM | POA: Diagnosis not present

## 2019-11-06 DIAGNOSIS — N189 Chronic kidney disease, unspecified: Secondary | ICD-10-CM | POA: Diagnosis not present

## 2019-11-06 DIAGNOSIS — J9601 Acute respiratory failure with hypoxia: Secondary | ICD-10-CM | POA: Diagnosis not present

## 2019-11-06 DIAGNOSIS — M25562 Pain in left knee: Secondary | ICD-10-CM | POA: Diagnosis not present

## 2019-11-06 DIAGNOSIS — N179 Acute kidney failure, unspecified: Secondary | ICD-10-CM | POA: Diagnosis not present

## 2019-11-06 DIAGNOSIS — I5033 Acute on chronic diastolic (congestive) heart failure: Secondary | ICD-10-CM | POA: Diagnosis not present

## 2019-11-06 DIAGNOSIS — I2510 Atherosclerotic heart disease of native coronary artery without angina pectoris: Secondary | ICD-10-CM | POA: Diagnosis not present

## 2019-11-06 DIAGNOSIS — T462X5S Adverse effect of other antidysrhythmic drugs, sequela: Secondary | ICD-10-CM | POA: Diagnosis not present

## 2019-11-06 DIAGNOSIS — H905 Unspecified sensorineural hearing loss: Secondary | ICD-10-CM | POA: Diagnosis not present

## 2019-11-06 DIAGNOSIS — E1151 Type 2 diabetes mellitus with diabetic peripheral angiopathy without gangrene: Secondary | ICD-10-CM | POA: Diagnosis not present

## 2019-11-06 DIAGNOSIS — L97519 Non-pressure chronic ulcer of other part of right foot with unspecified severity: Secondary | ICD-10-CM | POA: Diagnosis not present

## 2019-11-06 DIAGNOSIS — Z20828 Contact with and (suspected) exposure to other viral communicable diseases: Secondary | ICD-10-CM | POA: Diagnosis not present

## 2019-11-06 DIAGNOSIS — M1990 Unspecified osteoarthritis, unspecified site: Secondary | ICD-10-CM | POA: Diagnosis not present

## 2019-11-06 DIAGNOSIS — I4891 Unspecified atrial fibrillation: Secondary | ICD-10-CM | POA: Diagnosis not present

## 2019-11-06 DIAGNOSIS — E785 Hyperlipidemia, unspecified: Secondary | ICD-10-CM | POA: Diagnosis not present

## 2019-11-06 DIAGNOSIS — M4800 Spinal stenosis, site unspecified: Secondary | ICD-10-CM | POA: Diagnosis not present

## 2019-11-06 DIAGNOSIS — Z9181 History of falling: Secondary | ICD-10-CM | POA: Diagnosis not present

## 2019-11-06 DIAGNOSIS — J849 Interstitial pulmonary disease, unspecified: Secondary | ICD-10-CM | POA: Diagnosis not present

## 2019-11-06 DIAGNOSIS — K219 Gastro-esophageal reflux disease without esophagitis: Secondary | ICD-10-CM | POA: Diagnosis not present

## 2019-11-06 DIAGNOSIS — Z96641 Presence of right artificial hip joint: Secondary | ICD-10-CM | POA: Diagnosis not present

## 2019-11-06 DIAGNOSIS — J703 Chronic drug-induced interstitial lung disorders: Secondary | ICD-10-CM | POA: Diagnosis not present

## 2019-11-06 DIAGNOSIS — Z789 Other specified health status: Secondary | ICD-10-CM | POA: Diagnosis not present

## 2019-11-06 DIAGNOSIS — E11621 Type 2 diabetes mellitus with foot ulcer: Secondary | ICD-10-CM | POA: Diagnosis not present

## 2019-11-06 DIAGNOSIS — M810 Age-related osteoporosis without current pathological fracture: Secondary | ICD-10-CM | POA: Diagnosis not present

## 2019-11-06 DIAGNOSIS — A419 Sepsis, unspecified organism: Secondary | ICD-10-CM | POA: Diagnosis not present

## 2019-11-06 DIAGNOSIS — D126 Benign neoplasm of colon, unspecified: Secondary | ICD-10-CM | POA: Diagnosis not present

## 2019-11-06 DIAGNOSIS — J9600 Acute respiratory failure, unspecified whether with hypoxia or hypercapnia: Secondary | ICD-10-CM | POA: Diagnosis not present

## 2019-11-06 DIAGNOSIS — E1122 Type 2 diabetes mellitus with diabetic chronic kidney disease: Secondary | ICD-10-CM | POA: Diagnosis not present

## 2019-11-06 DIAGNOSIS — I214 Non-ST elevation (NSTEMI) myocardial infarction: Secondary | ICD-10-CM | POA: Diagnosis not present

## 2019-11-06 DIAGNOSIS — Z794 Long term (current) use of insulin: Secondary | ICD-10-CM | POA: Diagnosis not present

## 2019-11-06 DIAGNOSIS — E119 Type 2 diabetes mellitus without complications: Secondary | ICD-10-CM | POA: Diagnosis not present

## 2019-11-06 DIAGNOSIS — E559 Vitamin D deficiency, unspecified: Secondary | ICD-10-CM | POA: Diagnosis not present

## 2019-11-07 DIAGNOSIS — Z9181 History of falling: Secondary | ICD-10-CM | POA: Diagnosis not present

## 2019-11-07 DIAGNOSIS — N179 Acute kidney failure, unspecified: Secondary | ICD-10-CM | POA: Diagnosis not present

## 2019-11-07 DIAGNOSIS — Z7982 Long term (current) use of aspirin: Secondary | ICD-10-CM | POA: Diagnosis not present

## 2019-11-07 DIAGNOSIS — I5040 Unspecified combined systolic (congestive) and diastolic (congestive) heart failure: Secondary | ICD-10-CM | POA: Diagnosis not present

## 2019-11-07 DIAGNOSIS — Z471 Aftercare following joint replacement surgery: Secondary | ICD-10-CM | POA: Diagnosis not present

## 2019-11-07 DIAGNOSIS — Z79891 Long term (current) use of opiate analgesic: Secondary | ICD-10-CM | POA: Diagnosis not present

## 2019-11-07 DIAGNOSIS — Z96652 Presence of left artificial knee joint: Secondary | ICD-10-CM | POA: Diagnosis not present

## 2019-11-07 DIAGNOSIS — I10 Essential (primary) hypertension: Secondary | ICD-10-CM | POA: Diagnosis not present

## 2019-11-07 DIAGNOSIS — A419 Sepsis, unspecified organism: Secondary | ICD-10-CM | POA: Diagnosis not present

## 2019-11-07 DIAGNOSIS — N189 Chronic kidney disease, unspecified: Secondary | ICD-10-CM | POA: Diagnosis not present

## 2019-11-07 DIAGNOSIS — I214 Non-ST elevation (NSTEMI) myocardial infarction: Secondary | ICD-10-CM | POA: Diagnosis not present

## 2019-11-07 DIAGNOSIS — E119 Type 2 diabetes mellitus without complications: Secondary | ICD-10-CM | POA: Diagnosis not present

## 2019-11-07 DIAGNOSIS — N3289 Other specified disorders of bladder: Secondary | ICD-10-CM | POA: Diagnosis not present

## 2019-11-08 DIAGNOSIS — I723 Aneurysm of iliac artery: Secondary | ICD-10-CM | POA: Diagnosis not present

## 2019-11-08 DIAGNOSIS — I714 Abdominal aortic aneurysm, without rupture: Secondary | ICD-10-CM | POA: Diagnosis not present

## 2019-11-08 DIAGNOSIS — L719 Rosacea, unspecified: Secondary | ICD-10-CM | POA: Diagnosis not present

## 2019-11-08 DIAGNOSIS — G4733 Obstructive sleep apnea (adult) (pediatric): Secondary | ICD-10-CM | POA: Diagnosis not present

## 2019-11-08 DIAGNOSIS — E785 Hyperlipidemia, unspecified: Secondary | ICD-10-CM | POA: Diagnosis not present

## 2019-11-08 DIAGNOSIS — B9561 Methicillin susceptible Staphylococcus aureus infection as the cause of diseases classified elsewhere: Secondary | ICD-10-CM | POA: Diagnosis not present

## 2019-11-08 DIAGNOSIS — G40009 Localization-related (focal) (partial) idiopathic epilepsy and epileptic syndromes with seizures of localized onset, not intractable, without status epilepticus: Secondary | ICD-10-CM | POA: Diagnosis not present

## 2019-11-08 DIAGNOSIS — R928 Other abnormal and inconclusive findings on diagnostic imaging of breast: Secondary | ICD-10-CM | POA: Diagnosis not present

## 2019-11-08 DIAGNOSIS — N261 Atrophy of kidney (terminal): Secondary | ICD-10-CM | POA: Diagnosis not present

## 2019-11-08 DIAGNOSIS — K219 Gastro-esophageal reflux disease without esophagitis: Secondary | ICD-10-CM | POA: Diagnosis not present

## 2019-11-08 DIAGNOSIS — F320 Major depressive disorder, single episode, mild: Secondary | ICD-10-CM | POA: Diagnosis not present

## 2019-11-08 DIAGNOSIS — N179 Acute kidney failure, unspecified: Secondary | ICD-10-CM | POA: Diagnosis not present

## 2019-11-08 DIAGNOSIS — I252 Old myocardial infarction: Secondary | ICD-10-CM | POA: Diagnosis not present

## 2019-11-08 DIAGNOSIS — K5651 Intestinal adhesions [bands], with partial obstruction: Secondary | ICD-10-CM | POA: Diagnosis not present

## 2019-11-08 DIAGNOSIS — I2699 Other pulmonary embolism without acute cor pulmonale: Secondary | ICD-10-CM | POA: Diagnosis not present

## 2019-11-08 DIAGNOSIS — Z7982 Long term (current) use of aspirin: Secondary | ICD-10-CM | POA: Diagnosis not present

## 2019-11-08 DIAGNOSIS — E559 Vitamin D deficiency, unspecified: Secondary | ICD-10-CM | POA: Diagnosis not present

## 2019-11-08 DIAGNOSIS — M5116 Intervertebral disc disorders with radiculopathy, lumbar region: Secondary | ICD-10-CM | POA: Diagnosis not present

## 2019-11-08 DIAGNOSIS — F419 Anxiety disorder, unspecified: Secondary | ICD-10-CM | POA: Diagnosis not present

## 2019-11-08 DIAGNOSIS — M1990 Unspecified osteoarthritis, unspecified site: Secondary | ICD-10-CM | POA: Diagnosis not present

## 2019-11-08 DIAGNOSIS — S21209A Unspecified open wound of unspecified back wall of thorax without penetration into thoracic cavity, initial encounter: Secondary | ICD-10-CM | POA: Diagnosis not present

## 2019-11-08 DIAGNOSIS — N401 Enlarged prostate with lower urinary tract symptoms: Secondary | ICD-10-CM | POA: Diagnosis not present

## 2019-11-08 DIAGNOSIS — K279 Peptic ulcer, site unspecified, unspecified as acute or chronic, without hemorrhage or perforation: Secondary | ICD-10-CM | POA: Diagnosis not present

## 2019-11-08 DIAGNOSIS — I10 Essential (primary) hypertension: Secondary | ICD-10-CM | POA: Diagnosis not present

## 2019-11-08 DIAGNOSIS — T8131XD Disruption of external operation (surgical) wound, not elsewhere classified, subsequent encounter: Secondary | ICD-10-CM | POA: Diagnosis not present

## 2019-11-08 DIAGNOSIS — U071 COVID-19: Secondary | ICD-10-CM | POA: Diagnosis not present

## 2019-11-08 DIAGNOSIS — Z792 Long term (current) use of antibiotics: Secondary | ICD-10-CM | POA: Diagnosis not present

## 2019-11-08 DIAGNOSIS — M069 Rheumatoid arthritis, unspecified: Secondary | ICD-10-CM | POA: Diagnosis not present

## 2019-11-08 DIAGNOSIS — R7881 Bacteremia: Secondary | ICD-10-CM | POA: Diagnosis not present

## 2019-11-08 DIAGNOSIS — Z452 Encounter for adjustment and management of vascular access device: Secondary | ICD-10-CM | POA: Diagnosis not present

## 2019-11-08 DIAGNOSIS — M1711 Unilateral primary osteoarthritis, right knee: Secondary | ICD-10-CM | POA: Diagnosis not present

## 2019-11-08 DIAGNOSIS — M2550 Pain in unspecified joint: Secondary | ICD-10-CM | POA: Diagnosis not present

## 2019-11-08 DIAGNOSIS — Z9189 Other specified personal risk factors, not elsewhere classified: Secondary | ICD-10-CM | POA: Diagnosis not present

## 2019-11-08 DIAGNOSIS — A419 Sepsis, unspecified organism: Secondary | ICD-10-CM | POA: Diagnosis not present

## 2019-11-08 DIAGNOSIS — Z743 Need for continuous supervision: Secondary | ICD-10-CM | POA: Diagnosis not present

## 2019-11-08 DIAGNOSIS — R279 Unspecified lack of coordination: Secondary | ICD-10-CM | POA: Diagnosis not present

## 2019-11-08 DIAGNOSIS — E7800 Pure hypercholesterolemia, unspecified: Secondary | ICD-10-CM | POA: Diagnosis not present

## 2019-11-08 DIAGNOSIS — G8114 Spastic hemiplegia affecting left nondominant side: Secondary | ICD-10-CM | POA: Diagnosis not present

## 2019-11-08 DIAGNOSIS — N3946 Mixed incontinence: Secondary | ICD-10-CM | POA: Diagnosis not present

## 2019-11-08 DIAGNOSIS — Z03818 Encounter for observation for suspected exposure to other biological agents ruled out: Secondary | ICD-10-CM | POA: Diagnosis not present

## 2019-11-08 DIAGNOSIS — M1712 Unilateral primary osteoarthritis, left knee: Secondary | ICD-10-CM | POA: Diagnosis not present

## 2019-11-08 DIAGNOSIS — M797 Fibromyalgia: Secondary | ICD-10-CM | POA: Diagnosis not present

## 2019-11-08 DIAGNOSIS — S52572P Other intraarticular fracture of lower end of left radius, subsequent encounter for closed fracture with malunion: Secondary | ICD-10-CM | POA: Diagnosis not present

## 2019-11-08 DIAGNOSIS — Z96652 Presence of left artificial knee joint: Secondary | ICD-10-CM | POA: Diagnosis not present

## 2019-11-08 DIAGNOSIS — I2510 Atherosclerotic heart disease of native coronary artery without angina pectoris: Secondary | ICD-10-CM | POA: Diagnosis not present

## 2019-11-08 DIAGNOSIS — Z48815 Encounter for surgical aftercare following surgery on the digestive system: Secondary | ICD-10-CM | POA: Diagnosis not present

## 2019-11-08 DIAGNOSIS — F0390 Unspecified dementia without behavioral disturbance: Secondary | ICD-10-CM | POA: Diagnosis not present

## 2019-11-08 DIAGNOSIS — R5381 Other malaise: Secondary | ICD-10-CM | POA: Diagnosis not present

## 2019-11-08 DIAGNOSIS — E119 Type 2 diabetes mellitus without complications: Secondary | ICD-10-CM | POA: Diagnosis not present

## 2019-11-08 DIAGNOSIS — Z20828 Contact with and (suspected) exposure to other viral communicable diseases: Secondary | ICD-10-CM | POA: Diagnosis not present

## 2019-11-08 DIAGNOSIS — K8020 Calculus of gallbladder without cholecystitis without obstruction: Secondary | ICD-10-CM | POA: Diagnosis not present

## 2019-11-08 DIAGNOSIS — I350 Nonrheumatic aortic (valve) stenosis: Secondary | ICD-10-CM | POA: Diagnosis not present

## 2019-11-09 DIAGNOSIS — K7581 Nonalcoholic steatohepatitis (NASH): Secondary | ICD-10-CM | POA: Diagnosis not present

## 2019-11-09 DIAGNOSIS — K7469 Other cirrhosis of liver: Secondary | ICD-10-CM | POA: Diagnosis not present

## 2019-11-09 DIAGNOSIS — R42 Dizziness and giddiness: Secondary | ICD-10-CM | POA: Diagnosis not present

## 2019-11-09 DIAGNOSIS — L218 Other seborrheic dermatitis: Secondary | ICD-10-CM | POA: Diagnosis not present

## 2019-11-09 DIAGNOSIS — F329 Major depressive disorder, single episode, unspecified: Secondary | ICD-10-CM | POA: Diagnosis not present

## 2019-11-09 DIAGNOSIS — D631 Anemia in chronic kidney disease: Secondary | ICD-10-CM | POA: Diagnosis not present

## 2019-11-09 DIAGNOSIS — E1169 Type 2 diabetes mellitus with other specified complication: Secondary | ICD-10-CM | POA: Diagnosis not present

## 2019-11-09 DIAGNOSIS — Z87891 Personal history of nicotine dependence: Secondary | ICD-10-CM | POA: Diagnosis not present

## 2019-11-09 DIAGNOSIS — I8500 Esophageal varices without bleeding: Secondary | ICD-10-CM | POA: Diagnosis not present

## 2019-11-09 DIAGNOSIS — Z20828 Contact with and (suspected) exposure to other viral communicable diseases: Secondary | ICD-10-CM | POA: Diagnosis not present

## 2019-11-09 DIAGNOSIS — J9611 Chronic respiratory failure with hypoxia: Secondary | ICD-10-CM | POA: Diagnosis not present

## 2019-11-09 DIAGNOSIS — Z0000 Encounter for general adult medical examination without abnormal findings: Secondary | ICD-10-CM | POA: Diagnosis not present

## 2019-11-09 DIAGNOSIS — F17210 Nicotine dependence, cigarettes, uncomplicated: Secondary | ICD-10-CM | POA: Diagnosis not present

## 2019-11-09 DIAGNOSIS — R350 Frequency of micturition: Secondary | ICD-10-CM | POA: Diagnosis not present

## 2019-11-09 DIAGNOSIS — Z299 Encounter for prophylactic measures, unspecified: Secondary | ICD-10-CM | POA: Diagnosis not present

## 2019-11-09 DIAGNOSIS — K219 Gastro-esophageal reflux disease without esophagitis: Secondary | ICD-10-CM | POA: Diagnosis not present

## 2019-11-09 DIAGNOSIS — Z1331 Encounter for screening for depression: Secondary | ICD-10-CM | POA: Diagnosis not present

## 2019-11-09 DIAGNOSIS — E039 Hypothyroidism, unspecified: Secondary | ICD-10-CM | POA: Diagnosis not present

## 2019-11-09 DIAGNOSIS — M5136 Other intervertebral disc degeneration, lumbar region: Secondary | ICD-10-CM | POA: Diagnosis not present

## 2019-11-09 DIAGNOSIS — R200 Anesthesia of skin: Secondary | ICD-10-CM | POA: Diagnosis not present

## 2019-11-09 DIAGNOSIS — Z1339 Encounter for screening examination for other mental health and behavioral disorders: Secondary | ICD-10-CM | POA: Diagnosis not present

## 2019-11-09 DIAGNOSIS — Z7982 Long term (current) use of aspirin: Secondary | ICD-10-CM | POA: Diagnosis not present

## 2019-11-09 DIAGNOSIS — U071 COVID-19: Secondary | ICD-10-CM | POA: Diagnosis not present

## 2019-11-09 DIAGNOSIS — F419 Anxiety disorder, unspecified: Secondary | ICD-10-CM | POA: Diagnosis not present

## 2019-11-09 DIAGNOSIS — I132 Hypertensive heart and chronic kidney disease with heart failure and with stage 5 chronic kidney disease, or end stage renal disease: Secondary | ICD-10-CM | POA: Diagnosis not present

## 2019-11-09 DIAGNOSIS — M9904 Segmental and somatic dysfunction of sacral region: Secondary | ICD-10-CM | POA: Diagnosis not present

## 2019-11-09 DIAGNOSIS — J4520 Mild intermittent asthma, uncomplicated: Secondary | ICD-10-CM | POA: Diagnosis not present

## 2019-11-09 DIAGNOSIS — Z79899 Other long term (current) drug therapy: Secondary | ICD-10-CM | POA: Diagnosis not present

## 2019-11-09 DIAGNOSIS — Z6827 Body mass index (BMI) 27.0-27.9, adult: Secondary | ICD-10-CM | POA: Diagnosis not present

## 2019-11-09 DIAGNOSIS — Z9181 History of falling: Secondary | ICD-10-CM | POA: Diagnosis not present

## 2019-11-09 DIAGNOSIS — E871 Hypo-osmolality and hyponatremia: Secondary | ICD-10-CM | POA: Diagnosis not present

## 2019-11-09 DIAGNOSIS — I5030 Unspecified diastolic (congestive) heart failure: Secondary | ICD-10-CM | POA: Diagnosis not present

## 2019-11-09 DIAGNOSIS — Z6825 Body mass index (BMI) 25.0-25.9, adult: Secondary | ICD-10-CM | POA: Diagnosis not present

## 2019-11-09 DIAGNOSIS — M5137 Other intervertebral disc degeneration, lumbosacral region: Secondary | ICD-10-CM | POA: Diagnosis not present

## 2019-11-09 DIAGNOSIS — I340 Nonrheumatic mitral (valve) insufficiency: Secondary | ICD-10-CM | POA: Diagnosis not present

## 2019-11-09 DIAGNOSIS — L853 Xerosis cutis: Secondary | ICD-10-CM | POA: Diagnosis not present

## 2019-11-09 DIAGNOSIS — R278 Other lack of coordination: Secondary | ICD-10-CM | POA: Diagnosis not present

## 2019-11-09 DIAGNOSIS — Z6833 Body mass index (BMI) 33.0-33.9, adult: Secondary | ICD-10-CM | POA: Diagnosis not present

## 2019-11-09 DIAGNOSIS — R7309 Other abnormal glucose: Secondary | ICD-10-CM | POA: Diagnosis not present

## 2019-11-09 DIAGNOSIS — J189 Pneumonia, unspecified organism: Secondary | ICD-10-CM | POA: Diagnosis not present

## 2019-11-09 DIAGNOSIS — R5383 Other fatigue: Secondary | ICD-10-CM | POA: Diagnosis not present

## 2019-11-09 DIAGNOSIS — I69344 Monoplegia of lower limb following cerebral infarction affecting left non-dominant side: Secondary | ICD-10-CM | POA: Diagnosis not present

## 2019-11-09 DIAGNOSIS — Z1211 Encounter for screening for malignant neoplasm of colon: Secondary | ICD-10-CM | POA: Diagnosis not present

## 2019-11-09 DIAGNOSIS — E119 Type 2 diabetes mellitus without complications: Secondary | ICD-10-CM | POA: Diagnosis not present

## 2019-11-09 DIAGNOSIS — E1122 Type 2 diabetes mellitus with diabetic chronic kidney disease: Secondary | ICD-10-CM | POA: Diagnosis not present

## 2019-11-09 DIAGNOSIS — B0089 Other herpesviral infection: Secondary | ICD-10-CM | POA: Diagnosis not present

## 2019-11-09 DIAGNOSIS — M47817 Spondylosis without myelopathy or radiculopathy, lumbosacral region: Secondary | ICD-10-CM | POA: Diagnosis not present

## 2019-11-09 DIAGNOSIS — Z03818 Encounter for observation for suspected exposure to other biological agents ruled out: Secondary | ICD-10-CM | POA: Diagnosis not present

## 2019-11-09 DIAGNOSIS — I447 Left bundle-branch block, unspecified: Secondary | ICD-10-CM | POA: Diagnosis not present

## 2019-11-09 DIAGNOSIS — M1990 Unspecified osteoarthritis, unspecified site: Secondary | ICD-10-CM | POA: Diagnosis not present

## 2019-11-09 DIAGNOSIS — E559 Vitamin D deficiency, unspecified: Secondary | ICD-10-CM | POA: Diagnosis not present

## 2019-11-09 DIAGNOSIS — M65862 Other synovitis and tenosynovitis, left lower leg: Secondary | ICD-10-CM | POA: Diagnosis not present

## 2019-11-09 DIAGNOSIS — I10 Essential (primary) hypertension: Secondary | ICD-10-CM | POA: Diagnosis not present

## 2019-11-09 DIAGNOSIS — H409 Unspecified glaucoma: Secondary | ICD-10-CM | POA: Diagnosis not present

## 2019-11-09 DIAGNOSIS — Z2821 Immunization not carried out because of patient refusal: Secondary | ICD-10-CM | POA: Diagnosis not present

## 2019-11-09 DIAGNOSIS — S62502A Fracture of unspecified phalanx of left thumb, initial encounter for closed fracture: Secondary | ICD-10-CM | POA: Diagnosis not present

## 2019-11-09 DIAGNOSIS — E7800 Pure hypercholesterolemia, unspecified: Secondary | ICD-10-CM | POA: Diagnosis not present

## 2019-11-09 DIAGNOSIS — K7290 Hepatic failure, unspecified without coma: Secondary | ICD-10-CM | POA: Diagnosis not present

## 2019-11-09 DIAGNOSIS — I480 Paroxysmal atrial fibrillation: Secondary | ICD-10-CM | POA: Diagnosis not present

## 2019-11-09 DIAGNOSIS — N185 Chronic kidney disease, stage 5: Secondary | ICD-10-CM | POA: Diagnosis not present

## 2019-11-09 DIAGNOSIS — Z7189 Other specified counseling: Secondary | ICD-10-CM | POA: Diagnosis not present

## 2019-11-09 DIAGNOSIS — Z6823 Body mass index (BMI) 23.0-23.9, adult: Secondary | ICD-10-CM | POA: Diagnosis not present

## 2019-11-09 DIAGNOSIS — M4316 Spondylolisthesis, lumbar region: Secondary | ICD-10-CM | POA: Diagnosis not present

## 2019-11-09 DIAGNOSIS — M1712 Unilateral primary osteoarthritis, left knee: Secondary | ICD-10-CM | POA: Diagnosis not present

## 2019-11-09 DIAGNOSIS — G4733 Obstructive sleep apnea (adult) (pediatric): Secondary | ICD-10-CM | POA: Diagnosis not present

## 2019-11-09 DIAGNOSIS — E785 Hyperlipidemia, unspecified: Secondary | ICD-10-CM | POA: Diagnosis not present

## 2019-11-09 DIAGNOSIS — J069 Acute upper respiratory infection, unspecified: Secondary | ICD-10-CM | POA: Diagnosis not present

## 2019-11-09 DIAGNOSIS — E1165 Type 2 diabetes mellitus with hyperglycemia: Secondary | ICD-10-CM | POA: Diagnosis not present

## 2019-11-09 DIAGNOSIS — J019 Acute sinusitis, unspecified: Secondary | ICD-10-CM | POA: Diagnosis not present

## 2019-11-09 DIAGNOSIS — M9903 Segmental and somatic dysfunction of lumbar region: Secondary | ICD-10-CM | POA: Diagnosis not present

## 2019-11-09 DIAGNOSIS — E876 Hypokalemia: Secondary | ICD-10-CM | POA: Diagnosis not present

## 2019-11-09 DIAGNOSIS — D649 Anemia, unspecified: Secondary | ICD-10-CM | POA: Diagnosis not present

## 2019-11-09 DIAGNOSIS — M6281 Muscle weakness (generalized): Secondary | ICD-10-CM | POA: Diagnosis not present

## 2019-11-10 DIAGNOSIS — Z1339 Encounter for screening examination for other mental health and behavioral disorders: Secondary | ICD-10-CM | POA: Diagnosis not present

## 2019-11-10 DIAGNOSIS — Z1211 Encounter for screening for malignant neoplasm of colon: Secondary | ICD-10-CM | POA: Diagnosis not present

## 2019-11-10 DIAGNOSIS — Z0000 Encounter for general adult medical examination without abnormal findings: Secondary | ICD-10-CM | POA: Diagnosis not present

## 2019-11-10 DIAGNOSIS — N390 Urinary tract infection, site not specified: Secondary | ICD-10-CM | POA: Diagnosis not present

## 2019-11-10 DIAGNOSIS — E7800 Pure hypercholesterolemia, unspecified: Secondary | ICD-10-CM | POA: Diagnosis not present

## 2019-11-10 DIAGNOSIS — Z952 Presence of prosthetic heart valve: Secondary | ICD-10-CM | POA: Diagnosis not present

## 2019-11-10 DIAGNOSIS — I4891 Unspecified atrial fibrillation: Secondary | ICD-10-CM | POA: Diagnosis not present

## 2019-11-10 DIAGNOSIS — Z7901 Long term (current) use of anticoagulants: Secondary | ICD-10-CM | POA: Diagnosis not present

## 2019-11-10 DIAGNOSIS — K56609 Unspecified intestinal obstruction, unspecified as to partial versus complete obstruction: Secondary | ICD-10-CM | POA: Diagnosis not present

## 2019-11-10 DIAGNOSIS — Z20822 Contact with and (suspected) exposure to covid-19: Secondary | ICD-10-CM | POA: Diagnosis not present

## 2019-11-10 DIAGNOSIS — E876 Hypokalemia: Secondary | ICD-10-CM | POA: Diagnosis not present

## 2019-11-10 DIAGNOSIS — E559 Vitamin D deficiency, unspecified: Secondary | ICD-10-CM | POA: Diagnosis not present

## 2019-11-10 DIAGNOSIS — M1812 Unilateral primary osteoarthritis of first carpometacarpal joint, left hand: Secondary | ICD-10-CM | POA: Diagnosis not present

## 2019-11-10 DIAGNOSIS — Z299 Encounter for prophylactic measures, unspecified: Secondary | ICD-10-CM | POA: Diagnosis not present

## 2019-11-10 DIAGNOSIS — K36 Other appendicitis: Secondary | ICD-10-CM | POA: Diagnosis not present

## 2019-11-10 DIAGNOSIS — E872 Acidosis: Secondary | ICD-10-CM | POA: Diagnosis not present

## 2019-11-10 DIAGNOSIS — E669 Obesity, unspecified: Secondary | ICD-10-CM | POA: Diagnosis not present

## 2019-11-10 DIAGNOSIS — Z1159 Encounter for screening for other viral diseases: Secondary | ICD-10-CM | POA: Diagnosis not present

## 2019-11-10 DIAGNOSIS — M419 Scoliosis, unspecified: Secondary | ICD-10-CM | POA: Diagnosis not present

## 2019-11-10 DIAGNOSIS — M7918 Myalgia, other site: Secondary | ICD-10-CM | POA: Diagnosis not present

## 2019-11-10 DIAGNOSIS — M545 Low back pain: Secondary | ICD-10-CM | POA: Diagnosis not present

## 2019-11-10 DIAGNOSIS — R062 Wheezing: Secondary | ICD-10-CM | POA: Diagnosis not present

## 2019-11-10 DIAGNOSIS — Z6825 Body mass index (BMI) 25.0-25.9, adult: Secondary | ICD-10-CM | POA: Diagnosis not present

## 2019-11-10 DIAGNOSIS — A419 Sepsis, unspecified organism: Secondary | ICD-10-CM | POA: Diagnosis not present

## 2019-11-10 DIAGNOSIS — N179 Acute kidney failure, unspecified: Secondary | ICD-10-CM | POA: Diagnosis not present

## 2019-11-10 DIAGNOSIS — Z79899 Other long term (current) drug therapy: Secondary | ICD-10-CM | POA: Diagnosis not present

## 2019-11-10 DIAGNOSIS — M542 Cervicalgia: Secondary | ICD-10-CM | POA: Diagnosis not present

## 2019-11-10 DIAGNOSIS — I10 Essential (primary) hypertension: Secondary | ICD-10-CM | POA: Diagnosis not present

## 2019-11-10 DIAGNOSIS — M25559 Pain in unspecified hip: Secondary | ICD-10-CM | POA: Diagnosis not present

## 2019-11-10 DIAGNOSIS — D649 Anemia, unspecified: Secondary | ICD-10-CM | POA: Diagnosis not present

## 2019-11-10 DIAGNOSIS — Z20828 Contact with and (suspected) exposure to other viral communicable diseases: Secondary | ICD-10-CM | POA: Diagnosis not present

## 2019-11-10 DIAGNOSIS — Z6831 Body mass index (BMI) 31.0-31.9, adult: Secondary | ICD-10-CM | POA: Diagnosis not present

## 2019-11-10 DIAGNOSIS — E785 Hyperlipidemia, unspecified: Secondary | ICD-10-CM | POA: Diagnosis not present

## 2019-11-10 DIAGNOSIS — G8929 Other chronic pain: Secondary | ICD-10-CM | POA: Diagnosis not present

## 2019-11-10 DIAGNOSIS — R5383 Other fatigue: Secondary | ICD-10-CM | POA: Diagnosis not present

## 2019-11-10 DIAGNOSIS — Z7189 Other specified counseling: Secondary | ICD-10-CM | POA: Diagnosis not present

## 2019-11-10 DIAGNOSIS — Z1331 Encounter for screening for depression: Secondary | ICD-10-CM | POA: Diagnosis not present

## 2019-11-10 DIAGNOSIS — Z951 Presence of aortocoronary bypass graft: Secondary | ICD-10-CM | POA: Diagnosis not present

## 2019-11-10 DIAGNOSIS — I5041 Acute combined systolic (congestive) and diastolic (congestive) heart failure: Secondary | ICD-10-CM | POA: Diagnosis not present

## 2019-11-10 DIAGNOSIS — M810 Age-related osteoporosis without current pathological fracture: Secondary | ICD-10-CM | POA: Diagnosis not present

## 2019-11-10 DIAGNOSIS — I38 Endocarditis, valve unspecified: Secondary | ICD-10-CM | POA: Diagnosis not present

## 2019-11-10 DIAGNOSIS — E119 Type 2 diabetes mellitus without complications: Secondary | ICD-10-CM | POA: Diagnosis not present

## 2019-11-10 DIAGNOSIS — K7689 Other specified diseases of liver: Secondary | ICD-10-CM | POA: Diagnosis not present

## 2019-11-12 DIAGNOSIS — N183 Chronic kidney disease, stage 3 unspecified: Secondary | ICD-10-CM | POA: Diagnosis not present

## 2019-11-12 DIAGNOSIS — Z8673 Personal history of transient ischemic attack (TIA), and cerebral infarction without residual deficits: Secondary | ICD-10-CM | POA: Diagnosis not present

## 2019-11-12 DIAGNOSIS — E1165 Type 2 diabetes mellitus with hyperglycemia: Secondary | ICD-10-CM | POA: Diagnosis not present

## 2019-11-12 DIAGNOSIS — N179 Acute kidney failure, unspecified: Secondary | ICD-10-CM | POA: Diagnosis not present

## 2019-11-12 DIAGNOSIS — E1122 Type 2 diabetes mellitus with diabetic chronic kidney disease: Secondary | ICD-10-CM | POA: Diagnosis not present

## 2019-11-12 DIAGNOSIS — I872 Venous insufficiency (chronic) (peripheral): Secondary | ICD-10-CM | POA: Diagnosis not present

## 2019-11-12 DIAGNOSIS — K921 Melena: Secondary | ICD-10-CM | POA: Diagnosis not present

## 2019-11-12 DIAGNOSIS — I130 Hypertensive heart and chronic kidney disease with heart failure and stage 1 through stage 4 chronic kidney disease, or unspecified chronic kidney disease: Secondary | ICD-10-CM | POA: Diagnosis not present

## 2019-11-12 DIAGNOSIS — I5032 Chronic diastolic (congestive) heart failure: Secondary | ICD-10-CM | POA: Diagnosis not present

## 2019-11-12 DIAGNOSIS — E1151 Type 2 diabetes mellitus with diabetic peripheral angiopathy without gangrene: Secondary | ICD-10-CM | POA: Diagnosis not present

## 2019-11-12 DIAGNOSIS — E039 Hypothyroidism, unspecified: Secondary | ICD-10-CM | POA: Diagnosis not present

## 2019-11-12 DIAGNOSIS — E785 Hyperlipidemia, unspecified: Secondary | ICD-10-CM | POA: Diagnosis not present

## 2019-11-12 DIAGNOSIS — C189 Malignant neoplasm of colon, unspecified: Secondary | ICD-10-CM | POA: Diagnosis not present

## 2019-11-13 DIAGNOSIS — S0990XA Unspecified injury of head, initial encounter: Secondary | ICD-10-CM | POA: Diagnosis not present

## 2019-11-13 DIAGNOSIS — I255 Ischemic cardiomyopathy: Secondary | ICD-10-CM | POA: Diagnosis not present

## 2019-11-13 DIAGNOSIS — J1282 Pneumonia due to coronavirus disease 2019: Secondary | ICD-10-CM | POA: Diagnosis not present

## 2019-11-13 DIAGNOSIS — A4189 Other specified sepsis: Secondary | ICD-10-CM | POA: Diagnosis not present

## 2019-11-13 DIAGNOSIS — Z7982 Long term (current) use of aspirin: Secondary | ICD-10-CM | POA: Diagnosis not present

## 2019-11-13 DIAGNOSIS — S022XXA Fracture of nasal bones, initial encounter for closed fracture: Secondary | ICD-10-CM | POA: Diagnosis not present

## 2019-11-13 DIAGNOSIS — E782 Mixed hyperlipidemia: Secondary | ICD-10-CM | POA: Diagnosis not present

## 2019-11-13 DIAGNOSIS — K219 Gastro-esophageal reflux disease without esophagitis: Secondary | ICD-10-CM | POA: Diagnosis not present

## 2019-11-13 DIAGNOSIS — Z781 Physical restraint status: Secondary | ICD-10-CM | POA: Diagnosis not present

## 2019-11-13 DIAGNOSIS — E1165 Type 2 diabetes mellitus with hyperglycemia: Secondary | ICD-10-CM | POA: Diagnosis not present

## 2019-11-13 DIAGNOSIS — E861 Hypovolemia: Secondary | ICD-10-CM | POA: Diagnosis not present

## 2019-11-13 DIAGNOSIS — Z79899 Other long term (current) drug therapy: Secondary | ICD-10-CM | POA: Diagnosis not present

## 2019-11-13 DIAGNOSIS — I10 Essential (primary) hypertension: Secondary | ICD-10-CM | POA: Diagnosis not present

## 2019-11-13 DIAGNOSIS — W182XXA Fall in (into) shower or empty bathtub, initial encounter: Secondary | ICD-10-CM | POA: Diagnosis not present

## 2019-11-13 DIAGNOSIS — G9341 Metabolic encephalopathy: Secondary | ICD-10-CM | POA: Diagnosis not present

## 2019-11-13 DIAGNOSIS — U071 COVID-19: Secondary | ICD-10-CM | POA: Diagnosis not present

## 2019-11-13 DIAGNOSIS — Z794 Long term (current) use of insulin: Secondary | ICD-10-CM | POA: Diagnosis not present

## 2019-11-13 DIAGNOSIS — E871 Hypo-osmolality and hyponatremia: Secondary | ICD-10-CM | POA: Diagnosis not present

## 2019-11-13 DIAGNOSIS — I2510 Atherosclerotic heart disease of native coronary artery without angina pectoris: Secondary | ICD-10-CM | POA: Diagnosis not present

## 2019-11-13 DIAGNOSIS — E1140 Type 2 diabetes mellitus with diabetic neuropathy, unspecified: Secondary | ICD-10-CM | POA: Diagnosis not present

## 2019-11-13 DIAGNOSIS — B9620 Unspecified Escherichia coli [E. coli] as the cause of diseases classified elsewhere: Secondary | ICD-10-CM | POA: Diagnosis not present

## 2019-11-13 DIAGNOSIS — E876 Hypokalemia: Secondary | ICD-10-CM | POA: Diagnosis not present

## 2019-11-13 DIAGNOSIS — J9601 Acute respiratory failure with hypoxia: Secondary | ICD-10-CM | POA: Diagnosis not present

## 2019-11-13 DIAGNOSIS — R6520 Severe sepsis without septic shock: Secondary | ICD-10-CM | POA: Diagnosis not present

## 2019-11-13 DIAGNOSIS — N390 Urinary tract infection, site not specified: Secondary | ICD-10-CM | POA: Diagnosis not present

## 2019-11-14 DIAGNOSIS — R578 Other shock: Secondary | ICD-10-CM | POA: Diagnosis not present

## 2019-11-14 DIAGNOSIS — R350 Frequency of micturition: Secondary | ICD-10-CM | POA: Diagnosis not present

## 2019-11-14 DIAGNOSIS — U071 COVID-19: Secondary | ICD-10-CM | POA: Diagnosis not present

## 2019-11-15 DIAGNOSIS — Z0000 Encounter for general adult medical examination without abnormal findings: Secondary | ICD-10-CM | POA: Diagnosis not present

## 2019-11-15 DIAGNOSIS — R5383 Other fatigue: Secondary | ICD-10-CM | POA: Diagnosis not present

## 2019-11-15 DIAGNOSIS — Z6837 Body mass index (BMI) 37.0-37.9, adult: Secondary | ICD-10-CM | POA: Diagnosis not present

## 2019-11-15 DIAGNOSIS — Z1339 Encounter for screening examination for other mental health and behavioral disorders: Secondary | ICD-10-CM | POA: Diagnosis not present

## 2019-11-15 DIAGNOSIS — E7800 Pure hypercholesterolemia, unspecified: Secondary | ICD-10-CM | POA: Diagnosis not present

## 2019-11-15 DIAGNOSIS — Z1159 Encounter for screening for other viral diseases: Secondary | ICD-10-CM | POA: Diagnosis not present

## 2019-11-15 DIAGNOSIS — I10 Essential (primary) hypertension: Secondary | ICD-10-CM | POA: Diagnosis not present

## 2019-11-15 DIAGNOSIS — G4700 Insomnia, unspecified: Secondary | ICD-10-CM | POA: Diagnosis not present

## 2019-11-15 DIAGNOSIS — U071 COVID-19: Secondary | ICD-10-CM | POA: Diagnosis not present

## 2019-11-15 DIAGNOSIS — R278 Other lack of coordination: Secondary | ICD-10-CM | POA: Diagnosis not present

## 2019-11-15 DIAGNOSIS — G4733 Obstructive sleep apnea (adult) (pediatric): Secondary | ICD-10-CM | POA: Diagnosis not present

## 2019-11-15 DIAGNOSIS — Z1211 Encounter for screening for malignant neoplasm of colon: Secondary | ICD-10-CM | POA: Diagnosis not present

## 2019-11-15 DIAGNOSIS — M316 Other giant cell arteritis: Secondary | ICD-10-CM | POA: Diagnosis not present

## 2019-11-15 DIAGNOSIS — C50412 Malignant neoplasm of upper-outer quadrant of left female breast: Secondary | ICD-10-CM | POA: Diagnosis not present

## 2019-11-15 DIAGNOSIS — Z2821 Immunization not carried out because of patient refusal: Secondary | ICD-10-CM | POA: Diagnosis not present

## 2019-11-15 DIAGNOSIS — Z9189 Other specified personal risk factors, not elsewhere classified: Secondary | ICD-10-CM | POA: Diagnosis not present

## 2019-11-15 DIAGNOSIS — Z6827 Body mass index (BMI) 27.0-27.9, adult: Secondary | ICD-10-CM | POA: Diagnosis not present

## 2019-11-15 DIAGNOSIS — Z20828 Contact with and (suspected) exposure to other viral communicable diseases: Secondary | ICD-10-CM | POA: Diagnosis not present

## 2019-11-15 DIAGNOSIS — Z299 Encounter for prophylactic measures, unspecified: Secondary | ICD-10-CM | POA: Diagnosis not present

## 2019-11-15 DIAGNOSIS — Z1331 Encounter for screening for depression: Secondary | ICD-10-CM | POA: Diagnosis not present

## 2019-11-15 DIAGNOSIS — Z7189 Other specified counseling: Secondary | ICD-10-CM | POA: Diagnosis not present

## 2019-11-15 DIAGNOSIS — M6281 Muscle weakness (generalized): Secondary | ICD-10-CM | POA: Diagnosis not present

## 2019-11-15 DIAGNOSIS — Z6832 Body mass index (BMI) 32.0-32.9, adult: Secondary | ICD-10-CM | POA: Diagnosis not present

## 2019-11-15 DIAGNOSIS — Z03818 Encounter for observation for suspected exposure to other biological agents ruled out: Secondary | ICD-10-CM | POA: Diagnosis not present

## 2019-11-16 DIAGNOSIS — E876 Hypokalemia: Secondary | ICD-10-CM | POA: Diagnosis not present

## 2019-11-16 DIAGNOSIS — Z6841 Body Mass Index (BMI) 40.0 and over, adult: Secondary | ICD-10-CM | POA: Diagnosis not present

## 2019-11-16 DIAGNOSIS — E860 Dehydration: Secondary | ICD-10-CM | POA: Diagnosis not present

## 2019-11-16 DIAGNOSIS — Z6835 Body mass index (BMI) 35.0-35.9, adult: Secondary | ICD-10-CM | POA: Diagnosis not present

## 2019-11-16 DIAGNOSIS — Z9981 Dependence on supplemental oxygen: Secondary | ICD-10-CM | POA: Diagnosis not present

## 2019-11-16 DIAGNOSIS — E1122 Type 2 diabetes mellitus with diabetic chronic kidney disease: Secondary | ICD-10-CM | POA: Diagnosis not present

## 2019-11-16 DIAGNOSIS — Z7189 Other specified counseling: Secondary | ICD-10-CM | POA: Diagnosis not present

## 2019-11-16 DIAGNOSIS — Z1339 Encounter for screening examination for other mental health and behavioral disorders: Secondary | ICD-10-CM | POA: Diagnosis not present

## 2019-11-16 DIAGNOSIS — Z79899 Other long term (current) drug therapy: Secondary | ICD-10-CM | POA: Diagnosis not present

## 2019-11-16 DIAGNOSIS — R4182 Altered mental status, unspecified: Secondary | ICD-10-CM | POA: Diagnosis not present

## 2019-11-16 DIAGNOSIS — E1165 Type 2 diabetes mellitus with hyperglycemia: Secondary | ICD-10-CM | POA: Diagnosis not present

## 2019-11-16 DIAGNOSIS — R2681 Unsteadiness on feet: Secondary | ICD-10-CM | POA: Diagnosis not present

## 2019-11-16 DIAGNOSIS — E039 Hypothyroidism, unspecified: Secondary | ICD-10-CM | POA: Diagnosis not present

## 2019-11-16 DIAGNOSIS — I5022 Chronic systolic (congestive) heart failure: Secondary | ICD-10-CM | POA: Diagnosis not present

## 2019-11-16 DIAGNOSIS — F329 Major depressive disorder, single episode, unspecified: Secondary | ICD-10-CM | POA: Diagnosis not present

## 2019-11-16 DIAGNOSIS — E7800 Pure hypercholesterolemia, unspecified: Secondary | ICD-10-CM | POA: Diagnosis not present

## 2019-11-16 DIAGNOSIS — I10 Essential (primary) hypertension: Secondary | ICD-10-CM | POA: Diagnosis not present

## 2019-11-16 DIAGNOSIS — Z1211 Encounter for screening for malignant neoplasm of colon: Secondary | ICD-10-CM | POA: Diagnosis not present

## 2019-11-16 DIAGNOSIS — F419 Anxiety disorder, unspecified: Secondary | ICD-10-CM | POA: Diagnosis not present

## 2019-11-16 DIAGNOSIS — J9383 Other pneumothorax: Secondary | ICD-10-CM | POA: Diagnosis not present

## 2019-11-16 DIAGNOSIS — C3492 Malignant neoplasm of unspecified part of left bronchus or lung: Secondary | ICD-10-CM | POA: Diagnosis not present

## 2019-11-16 DIAGNOSIS — E872 Acidosis: Secondary | ICD-10-CM | POA: Diagnosis not present

## 2019-11-16 DIAGNOSIS — U071 COVID-19: Secondary | ICD-10-CM | POA: Diagnosis not present

## 2019-11-16 DIAGNOSIS — J440 Chronic obstructive pulmonary disease with acute lower respiratory infection: Secondary | ICD-10-CM | POA: Diagnosis not present

## 2019-11-16 DIAGNOSIS — J9612 Chronic respiratory failure with hypercapnia: Secondary | ICD-10-CM | POA: Diagnosis not present

## 2019-11-16 DIAGNOSIS — T500X6A Underdosing of mineralocorticoids and their antagonists, initial encounter: Secondary | ICD-10-CM | POA: Diagnosis not present

## 2019-11-16 DIAGNOSIS — R609 Edema, unspecified: Secondary | ICD-10-CM | POA: Diagnosis not present

## 2019-11-16 DIAGNOSIS — A4189 Other specified sepsis: Secondary | ICD-10-CM | POA: Diagnosis not present

## 2019-11-16 DIAGNOSIS — Z8249 Family history of ischemic heart disease and other diseases of the circulatory system: Secondary | ICD-10-CM | POA: Diagnosis not present

## 2019-11-16 DIAGNOSIS — C50412 Malignant neoplasm of upper-outer quadrant of left female breast: Secondary | ICD-10-CM | POA: Diagnosis not present

## 2019-11-16 DIAGNOSIS — Z1331 Encounter for screening for depression: Secondary | ICD-10-CM | POA: Diagnosis not present

## 2019-11-16 DIAGNOSIS — I639 Cerebral infarction, unspecified: Secondary | ICD-10-CM | POA: Diagnosis not present

## 2019-11-16 DIAGNOSIS — R6889 Other general symptoms and signs: Secondary | ICD-10-CM | POA: Diagnosis not present

## 2019-11-16 DIAGNOSIS — R5383 Other fatigue: Secondary | ICD-10-CM | POA: Diagnosis not present

## 2019-11-16 DIAGNOSIS — Z20828 Contact with and (suspected) exposure to other viral communicable diseases: Secondary | ICD-10-CM | POA: Diagnosis not present

## 2019-11-16 DIAGNOSIS — I5032 Chronic diastolic (congestive) heart failure: Secondary | ICD-10-CM | POA: Diagnosis not present

## 2019-11-16 DIAGNOSIS — R0789 Other chest pain: Secondary | ICD-10-CM | POA: Diagnosis not present

## 2019-11-16 DIAGNOSIS — M1990 Unspecified osteoarthritis, unspecified site: Secondary | ICD-10-CM | POA: Diagnosis not present

## 2019-11-16 DIAGNOSIS — J1289 Other viral pneumonia: Secondary | ICD-10-CM | POA: Diagnosis not present

## 2019-11-16 DIAGNOSIS — M47817 Spondylosis without myelopathy or radiculopathy, lumbosacral region: Secondary | ICD-10-CM | POA: Diagnosis not present

## 2019-11-16 DIAGNOSIS — I480 Paroxysmal atrial fibrillation: Secondary | ICD-10-CM | POA: Diagnosis not present

## 2019-11-16 DIAGNOSIS — M5137 Other intervertebral disc degeneration, lumbosacral region: Secondary | ICD-10-CM | POA: Diagnosis not present

## 2019-11-16 DIAGNOSIS — Z0000 Encounter for general adult medical examination without abnormal findings: Secondary | ICD-10-CM | POA: Diagnosis not present

## 2019-11-16 DIAGNOSIS — E559 Vitamin D deficiency, unspecified: Secondary | ICD-10-CM | POA: Diagnosis not present

## 2019-11-16 DIAGNOSIS — Z800 Family history of malignant neoplasm of digestive organs: Secondary | ICD-10-CM | POA: Diagnosis not present

## 2019-11-16 DIAGNOSIS — Z9114 Patient's other noncompliance with medication regimen: Secondary | ICD-10-CM | POA: Diagnosis not present

## 2019-11-16 DIAGNOSIS — E869 Volume depletion, unspecified: Secondary | ICD-10-CM | POA: Diagnosis not present

## 2019-11-16 DIAGNOSIS — N139 Obstructive and reflux uropathy, unspecified: Secondary | ICD-10-CM | POA: Diagnosis not present

## 2019-11-16 DIAGNOSIS — I959 Hypotension, unspecified: Secondary | ICD-10-CM | POA: Diagnosis not present

## 2019-11-16 DIAGNOSIS — R6520 Severe sepsis without septic shock: Secondary | ICD-10-CM | POA: Diagnosis not present

## 2019-11-16 DIAGNOSIS — R32 Unspecified urinary incontinence: Secondary | ICD-10-CM | POA: Diagnosis not present

## 2019-11-16 DIAGNOSIS — I2694 Multiple subsegmental pulmonary emboli without acute cor pulmonale: Secondary | ICD-10-CM | POA: Diagnosis not present

## 2019-11-16 DIAGNOSIS — I482 Chronic atrial fibrillation, unspecified: Secondary | ICD-10-CM | POA: Diagnosis not present

## 2019-11-16 DIAGNOSIS — Z20822 Contact with and (suspected) exposure to covid-19: Secondary | ICD-10-CM | POA: Diagnosis not present

## 2019-11-16 DIAGNOSIS — N186 End stage renal disease: Secondary | ICD-10-CM | POA: Diagnosis not present

## 2019-11-16 DIAGNOSIS — I890 Lymphedema, not elsewhere classified: Secondary | ICD-10-CM | POA: Diagnosis not present

## 2019-11-16 DIAGNOSIS — N1831 Chronic kidney disease, stage 3a: Secondary | ICD-10-CM | POA: Diagnosis not present

## 2019-11-16 DIAGNOSIS — Z96651 Presence of right artificial knee joint: Secondary | ICD-10-CM | POA: Diagnosis not present

## 2019-11-16 DIAGNOSIS — J449 Chronic obstructive pulmonary disease, unspecified: Secondary | ICD-10-CM | POA: Diagnosis not present

## 2019-11-16 DIAGNOSIS — N179 Acute kidney failure, unspecified: Secondary | ICD-10-CM | POA: Diagnosis not present

## 2019-11-16 DIAGNOSIS — Z6827 Body mass index (BMI) 27.0-27.9, adult: Secondary | ICD-10-CM | POA: Diagnosis not present

## 2019-11-16 DIAGNOSIS — M1712 Unilateral primary osteoarthritis, left knee: Secondary | ICD-10-CM | POA: Diagnosis not present

## 2019-11-16 DIAGNOSIS — M5136 Other intervertebral disc degeneration, lumbar region: Secondary | ICD-10-CM | POA: Diagnosis not present

## 2019-11-16 DIAGNOSIS — E669 Obesity, unspecified: Secondary | ICD-10-CM | POA: Diagnosis not present

## 2019-11-16 DIAGNOSIS — J9601 Acute respiratory failure with hypoxia: Secondary | ICD-10-CM | POA: Diagnosis not present

## 2019-11-16 DIAGNOSIS — Z7901 Long term (current) use of anticoagulants: Secondary | ICD-10-CM | POA: Diagnosis not present

## 2019-11-16 DIAGNOSIS — L89310 Pressure ulcer of right buttock, unstageable: Secondary | ICD-10-CM | POA: Diagnosis not present

## 2019-11-16 DIAGNOSIS — M1711 Unilateral primary osteoarthritis, right knee: Secondary | ICD-10-CM | POA: Diagnosis not present

## 2019-11-16 DIAGNOSIS — M9904 Segmental and somatic dysfunction of sacral region: Secondary | ICD-10-CM | POA: Diagnosis not present

## 2019-11-16 DIAGNOSIS — I110 Hypertensive heart disease with heart failure: Secondary | ICD-10-CM | POA: Diagnosis not present

## 2019-11-16 DIAGNOSIS — R0902 Hypoxemia: Secondary | ICD-10-CM | POA: Diagnosis not present

## 2019-11-16 DIAGNOSIS — M6281 Muscle weakness (generalized): Secondary | ICD-10-CM | POA: Diagnosis not present

## 2019-11-16 DIAGNOSIS — R278 Other lack of coordination: Secondary | ICD-10-CM | POA: Diagnosis not present

## 2019-11-16 DIAGNOSIS — M4316 Spondylolisthesis, lumbar region: Secondary | ICD-10-CM | POA: Diagnosis not present

## 2019-11-16 DIAGNOSIS — R6881 Early satiety: Secondary | ICD-10-CM | POA: Diagnosis not present

## 2019-11-16 DIAGNOSIS — N189 Chronic kidney disease, unspecified: Secondary | ICD-10-CM | POA: Diagnosis not present

## 2019-11-16 DIAGNOSIS — I130 Hypertensive heart and chronic kidney disease with heart failure and stage 1 through stage 4 chronic kidney disease, or unspecified chronic kidney disease: Secondary | ICD-10-CM | POA: Diagnosis not present

## 2019-11-16 DIAGNOSIS — E785 Hyperlipidemia, unspecified: Secondary | ICD-10-CM | POA: Diagnosis not present

## 2019-11-16 DIAGNOSIS — M9903 Segmental and somatic dysfunction of lumbar region: Secondary | ICD-10-CM | POA: Diagnosis not present

## 2019-11-16 DIAGNOSIS — S22080S Wedge compression fracture of T11-T12 vertebra, sequela: Secondary | ICD-10-CM | POA: Diagnosis not present

## 2019-11-16 DIAGNOSIS — Z299 Encounter for prophylactic measures, unspecified: Secondary | ICD-10-CM | POA: Diagnosis not present

## 2019-11-16 DIAGNOSIS — Z96652 Presence of left artificial knee joint: Secondary | ICD-10-CM | POA: Diagnosis not present

## 2019-11-16 DIAGNOSIS — I5042 Chronic combined systolic (congestive) and diastolic (congestive) heart failure: Secondary | ICD-10-CM | POA: Diagnosis not present

## 2019-11-16 DIAGNOSIS — M797 Fibromyalgia: Secondary | ICD-10-CM | POA: Diagnosis not present

## 2019-11-16 DIAGNOSIS — I132 Hypertensive heart and chronic kidney disease with heart failure and with stage 5 chronic kidney disease, or end stage renal disease: Secondary | ICD-10-CM | POA: Diagnosis not present

## 2019-11-16 DIAGNOSIS — F17210 Nicotine dependence, cigarettes, uncomplicated: Secondary | ICD-10-CM | POA: Diagnosis not present

## 2019-11-16 DIAGNOSIS — Z66 Do not resuscitate: Secondary | ICD-10-CM | POA: Diagnosis not present

## 2019-11-16 DIAGNOSIS — M79606 Pain in leg, unspecified: Secondary | ICD-10-CM | POA: Diagnosis not present

## 2019-11-16 DIAGNOSIS — J1282 Pneumonia due to coronavirus disease 2019: Secondary | ICD-10-CM | POA: Diagnosis not present

## 2019-11-16 DIAGNOSIS — I4891 Unspecified atrial fibrillation: Secondary | ICD-10-CM | POA: Diagnosis not present

## 2019-11-16 DIAGNOSIS — I82402 Acute embolism and thrombosis of unspecified deep veins of left lower extremity: Secondary | ICD-10-CM | POA: Diagnosis not present

## 2019-11-16 DIAGNOSIS — R530 Neoplastic (malignant) related fatigue: Secondary | ICD-10-CM | POA: Diagnosis not present

## 2019-11-16 DIAGNOSIS — D649 Anemia, unspecified: Secondary | ICD-10-CM | POA: Diagnosis not present

## 2019-11-16 DIAGNOSIS — G4733 Obstructive sleep apnea (adult) (pediatric): Secondary | ICD-10-CM | POA: Diagnosis not present

## 2019-11-16 DIAGNOSIS — E119 Type 2 diabetes mellitus without complications: Secondary | ICD-10-CM | POA: Diagnosis not present

## 2019-11-16 DIAGNOSIS — K5900 Constipation, unspecified: Secondary | ICD-10-CM | POA: Diagnosis not present

## 2019-11-16 DIAGNOSIS — I252 Old myocardial infarction: Secondary | ICD-10-CM | POA: Diagnosis not present

## 2019-11-16 DIAGNOSIS — J9611 Chronic respiratory failure with hypoxia: Secondary | ICD-10-CM | POA: Diagnosis not present

## 2019-11-16 DIAGNOSIS — R0602 Shortness of breath: Secondary | ICD-10-CM | POA: Diagnosis not present

## 2019-11-16 DIAGNOSIS — Z91128 Patient's intentional underdosing of medication regimen for other reason: Secondary | ICD-10-CM | POA: Diagnosis not present

## 2019-11-16 DIAGNOSIS — I5021 Acute systolic (congestive) heart failure: Secondary | ICD-10-CM | POA: Diagnosis not present

## 2019-11-16 DIAGNOSIS — I69311 Memory deficit following cerebral infarction: Secondary | ICD-10-CM | POA: Diagnosis not present

## 2019-11-16 DIAGNOSIS — J9621 Acute and chronic respiratory failure with hypoxia: Secondary | ICD-10-CM | POA: Diagnosis not present

## 2019-11-17 DIAGNOSIS — I248 Other forms of acute ischemic heart disease: Secondary | ICD-10-CM | POA: Diagnosis not present

## 2019-11-17 DIAGNOSIS — K264 Chronic or unspecified duodenal ulcer with hemorrhage: Secondary | ICD-10-CM | POA: Diagnosis not present

## 2019-11-17 DIAGNOSIS — I82403 Acute embolism and thrombosis of unspecified deep veins of lower extremity, bilateral: Secondary | ICD-10-CM | POA: Diagnosis not present

## 2019-11-17 DIAGNOSIS — M1711 Unilateral primary osteoarthritis, right knee: Secondary | ICD-10-CM | POA: Diagnosis not present

## 2019-11-17 DIAGNOSIS — J45909 Unspecified asthma, uncomplicated: Secondary | ICD-10-CM | POA: Diagnosis not present

## 2019-11-17 DIAGNOSIS — E1121 Type 2 diabetes mellitus with diabetic nephropathy: Secondary | ICD-10-CM | POA: Diagnosis not present

## 2019-11-17 DIAGNOSIS — I2699 Other pulmonary embolism without acute cor pulmonale: Secondary | ICD-10-CM | POA: Diagnosis not present

## 2019-11-17 DIAGNOSIS — I2510 Atherosclerotic heart disease of native coronary artery without angina pectoris: Secondary | ICD-10-CM | POA: Diagnosis not present

## 2019-11-17 DIAGNOSIS — N179 Acute kidney failure, unspecified: Secondary | ICD-10-CM | POA: Diagnosis not present

## 2019-11-17 DIAGNOSIS — Z20828 Contact with and (suspected) exposure to other viral communicable diseases: Secondary | ICD-10-CM | POA: Diagnosis not present

## 2019-11-17 DIAGNOSIS — J9621 Acute and chronic respiratory failure with hypoxia: Secondary | ICD-10-CM | POA: Diagnosis not present

## 2019-11-17 DIAGNOSIS — I5043 Acute on chronic combined systolic (congestive) and diastolic (congestive) heart failure: Secondary | ICD-10-CM | POA: Diagnosis not present

## 2019-11-17 DIAGNOSIS — M1990 Unspecified osteoarthritis, unspecified site: Secondary | ICD-10-CM | POA: Diagnosis not present

## 2019-11-17 DIAGNOSIS — E876 Hypokalemia: Secondary | ICD-10-CM | POA: Diagnosis not present

## 2019-11-17 DIAGNOSIS — R52 Pain, unspecified: Secondary | ICD-10-CM | POA: Diagnosis not present

## 2019-11-17 DIAGNOSIS — I110 Hypertensive heart disease with heart failure: Secondary | ICD-10-CM | POA: Diagnosis not present

## 2019-11-17 DIAGNOSIS — Z96651 Presence of right artificial knee joint: Secondary | ICD-10-CM | POA: Diagnosis not present

## 2019-11-17 DIAGNOSIS — L570 Actinic keratosis: Secondary | ICD-10-CM | POA: Diagnosis not present

## 2019-11-17 DIAGNOSIS — D62 Acute posthemorrhagic anemia: Secondary | ICD-10-CM | POA: Diagnosis not present

## 2019-11-17 DIAGNOSIS — M109 Gout, unspecified: Secondary | ICD-10-CM | POA: Diagnosis not present

## 2019-11-17 DIAGNOSIS — I252 Old myocardial infarction: Secondary | ICD-10-CM | POA: Diagnosis not present

## 2019-11-17 DIAGNOSIS — I5021 Acute systolic (congestive) heart failure: Secondary | ICD-10-CM | POA: Diagnosis not present

## 2019-11-17 DIAGNOSIS — J181 Lobar pneumonia, unspecified organism: Secondary | ICD-10-CM | POA: Diagnosis not present

## 2019-11-17 DIAGNOSIS — A419 Sepsis, unspecified organism: Secondary | ICD-10-CM | POA: Diagnosis not present

## 2019-11-17 DIAGNOSIS — Z48812 Encounter for surgical aftercare following surgery on the circulatory system: Secondary | ICD-10-CM | POA: Diagnosis not present

## 2019-11-17 DIAGNOSIS — J189 Pneumonia, unspecified organism: Secondary | ICD-10-CM | POA: Diagnosis not present

## 2019-11-17 DIAGNOSIS — J9601 Acute respiratory failure with hypoxia: Secondary | ICD-10-CM | POA: Diagnosis not present

## 2019-11-17 DIAGNOSIS — J8489 Other specified interstitial pulmonary diseases: Secondary | ICD-10-CM | POA: Diagnosis not present

## 2019-11-17 DIAGNOSIS — E1169 Type 2 diabetes mellitus with other specified complication: Secondary | ICD-10-CM | POA: Diagnosis not present

## 2019-11-17 DIAGNOSIS — I739 Peripheral vascular disease, unspecified: Secondary | ICD-10-CM | POA: Diagnosis not present

## 2019-11-17 DIAGNOSIS — J690 Pneumonitis due to inhalation of food and vomit: Secondary | ICD-10-CM | POA: Diagnosis not present

## 2019-11-17 DIAGNOSIS — E119 Type 2 diabetes mellitus without complications: Secondary | ICD-10-CM | POA: Diagnosis not present

## 2019-11-17 DIAGNOSIS — I255 Ischemic cardiomyopathy: Secondary | ICD-10-CM | POA: Diagnosis not present

## 2019-11-17 DIAGNOSIS — M62838 Other muscle spasm: Secondary | ICD-10-CM | POA: Diagnosis not present

## 2019-11-17 DIAGNOSIS — E785 Hyperlipidemia, unspecified: Secondary | ICD-10-CM | POA: Diagnosis not present

## 2019-11-17 DIAGNOSIS — D649 Anemia, unspecified: Secondary | ICD-10-CM | POA: Diagnosis not present

## 2019-11-17 DIAGNOSIS — Z79891 Long term (current) use of opiate analgesic: Secondary | ICD-10-CM | POA: Diagnosis not present

## 2019-11-17 DIAGNOSIS — R6520 Severe sepsis without septic shock: Secondary | ICD-10-CM | POA: Diagnosis not present

## 2019-11-17 DIAGNOSIS — C3492 Malignant neoplasm of unspecified part of left bronchus or lung: Secondary | ICD-10-CM | POA: Diagnosis not present

## 2019-11-17 DIAGNOSIS — F419 Anxiety disorder, unspecified: Secondary | ICD-10-CM | POA: Diagnosis not present

## 2019-11-17 DIAGNOSIS — E872 Acidosis: Secondary | ICD-10-CM | POA: Diagnosis not present

## 2019-11-17 DIAGNOSIS — K922 Gastrointestinal hemorrhage, unspecified: Secondary | ICD-10-CM | POA: Diagnosis not present

## 2019-11-17 DIAGNOSIS — R6510 Systemic inflammatory response syndrome (SIRS) of non-infectious origin without acute organ dysfunction: Secondary | ICD-10-CM | POA: Diagnosis not present

## 2019-11-17 DIAGNOSIS — U071 COVID-19: Secondary | ICD-10-CM | POA: Diagnosis not present

## 2019-11-17 DIAGNOSIS — I272 Pulmonary hypertension, unspecified: Secondary | ICD-10-CM | POA: Diagnosis not present

## 2019-11-17 DIAGNOSIS — T17908S Unspecified foreign body in respiratory tract, part unspecified causing other injury, sequela: Secondary | ICD-10-CM | POA: Diagnosis not present

## 2019-11-17 DIAGNOSIS — Z03818 Encounter for observation for suspected exposure to other biological agents ruled out: Secondary | ICD-10-CM | POA: Diagnosis not present

## 2019-11-17 DIAGNOSIS — M542 Cervicalgia: Secondary | ICD-10-CM | POA: Diagnosis not present

## 2019-11-17 DIAGNOSIS — N1831 Chronic kidney disease, stage 3a: Secondary | ICD-10-CM | POA: Diagnosis not present

## 2019-11-17 DIAGNOSIS — I959 Hypotension, unspecified: Secondary | ICD-10-CM | POA: Diagnosis not present

## 2019-11-18 DIAGNOSIS — M6281 Muscle weakness (generalized): Secondary | ICD-10-CM | POA: Diagnosis not present

## 2019-11-18 DIAGNOSIS — M7502 Adhesive capsulitis of left shoulder: Secondary | ICD-10-CM | POA: Diagnosis not present

## 2019-11-18 DIAGNOSIS — G8929 Other chronic pain: Secondary | ICD-10-CM | POA: Diagnosis not present

## 2019-11-18 DIAGNOSIS — F329 Major depressive disorder, single episode, unspecified: Secondary | ICD-10-CM | POA: Diagnosis not present

## 2019-11-18 DIAGNOSIS — Z515 Encounter for palliative care: Secondary | ICD-10-CM | POA: Diagnosis not present

## 2019-11-18 DIAGNOSIS — J9601 Acute respiratory failure with hypoxia: Secondary | ICD-10-CM | POA: Diagnosis not present

## 2019-11-18 DIAGNOSIS — K922 Gastrointestinal hemorrhage, unspecified: Secondary | ICD-10-CM | POA: Diagnosis not present

## 2019-11-18 DIAGNOSIS — R188 Other ascites: Secondary | ICD-10-CM | POA: Diagnosis not present

## 2019-11-18 DIAGNOSIS — R5381 Other malaise: Secondary | ICD-10-CM | POA: Diagnosis not present

## 2019-11-18 DIAGNOSIS — M11262 Other chondrocalcinosis, left knee: Secondary | ICD-10-CM | POA: Diagnosis not present

## 2019-11-18 DIAGNOSIS — M129 Arthropathy, unspecified: Secondary | ICD-10-CM | POA: Diagnosis not present

## 2019-11-18 DIAGNOSIS — F419 Anxiety disorder, unspecified: Secondary | ICD-10-CM | POA: Diagnosis not present

## 2019-11-18 DIAGNOSIS — F3189 Other bipolar disorder: Secondary | ICD-10-CM | POA: Diagnosis not present

## 2019-11-18 DIAGNOSIS — R278 Other lack of coordination: Secondary | ICD-10-CM | POA: Diagnosis not present

## 2019-11-18 DIAGNOSIS — A419 Sepsis, unspecified organism: Secondary | ICD-10-CM | POA: Diagnosis not present

## 2019-11-18 DIAGNOSIS — Z20828 Contact with and (suspected) exposure to other viral communicable diseases: Secondary | ICD-10-CM | POA: Diagnosis not present

## 2019-11-18 DIAGNOSIS — R5383 Other fatigue: Secondary | ICD-10-CM | POA: Diagnosis not present

## 2019-11-18 DIAGNOSIS — M170 Bilateral primary osteoarthritis of knee: Secondary | ICD-10-CM | POA: Diagnosis not present

## 2019-11-18 DIAGNOSIS — E785 Hyperlipidemia, unspecified: Secondary | ICD-10-CM | POA: Diagnosis not present

## 2019-11-18 DIAGNOSIS — I5021 Acute systolic (congestive) heart failure: Secondary | ICD-10-CM | POA: Diagnosis not present

## 2019-11-18 DIAGNOSIS — R55 Syncope and collapse: Secondary | ICD-10-CM | POA: Diagnosis not present

## 2019-11-18 DIAGNOSIS — Y92009 Unspecified place in unspecified non-institutional (private) residence as the place of occurrence of the external cause: Secondary | ICD-10-CM | POA: Diagnosis not present

## 2019-11-18 DIAGNOSIS — J309 Allergic rhinitis, unspecified: Secondary | ICD-10-CM | POA: Diagnosis not present

## 2019-11-18 DIAGNOSIS — R0602 Shortness of breath: Secondary | ICD-10-CM | POA: Diagnosis not present

## 2019-11-18 DIAGNOSIS — E1151 Type 2 diabetes mellitus with diabetic peripheral angiopathy without gangrene: Secondary | ICD-10-CM | POA: Diagnosis not present

## 2019-11-18 DIAGNOSIS — I87323 Chronic venous hypertension (idiopathic) with inflammation of bilateral lower extremity: Secondary | ICD-10-CM | POA: Diagnosis not present

## 2019-11-18 DIAGNOSIS — L89156 Pressure-induced deep tissue damage of sacral region: Secondary | ICD-10-CM | POA: Diagnosis not present

## 2019-11-18 DIAGNOSIS — I82220 Acute embolism and thrombosis of inferior vena cava: Secondary | ICD-10-CM | POA: Diagnosis not present

## 2019-11-18 DIAGNOSIS — C3492 Malignant neoplasm of unspecified part of left bronchus or lung: Secondary | ICD-10-CM | POA: Diagnosis not present

## 2019-11-18 DIAGNOSIS — R519 Headache, unspecified: Secondary | ICD-10-CM | POA: Diagnosis not present

## 2019-11-18 DIAGNOSIS — T17908S Unspecified foreign body in respiratory tract, part unspecified causing other injury, sequela: Secondary | ICD-10-CM | POA: Diagnosis not present

## 2019-11-18 DIAGNOSIS — U071 COVID-19: Secondary | ICD-10-CM | POA: Diagnosis not present

## 2019-11-18 DIAGNOSIS — W19XXXA Unspecified fall, initial encounter: Secondary | ICD-10-CM | POA: Diagnosis not present

## 2019-11-18 DIAGNOSIS — J90 Pleural effusion, not elsewhere classified: Secondary | ICD-10-CM | POA: Diagnosis not present

## 2019-11-18 DIAGNOSIS — J45909 Unspecified asthma, uncomplicated: Secondary | ICD-10-CM | POA: Diagnosis not present

## 2019-11-18 DIAGNOSIS — R0600 Dyspnea, unspecified: Secondary | ICD-10-CM | POA: Diagnosis not present

## 2019-11-18 DIAGNOSIS — M1990 Unspecified osteoarthritis, unspecified site: Secondary | ICD-10-CM | POA: Diagnosis not present

## 2019-11-18 DIAGNOSIS — E119 Type 2 diabetes mellitus without complications: Secondary | ICD-10-CM | POA: Diagnosis not present

## 2019-11-18 DIAGNOSIS — J9621 Acute and chronic respiratory failure with hypoxia: Secondary | ICD-10-CM | POA: Diagnosis not present

## 2019-11-18 DIAGNOSIS — I2699 Other pulmonary embolism without acute cor pulmonale: Secondary | ICD-10-CM | POA: Diagnosis not present

## 2019-11-18 DIAGNOSIS — K7460 Unspecified cirrhosis of liver: Secondary | ICD-10-CM | POA: Diagnosis not present

## 2019-11-18 DIAGNOSIS — R404 Transient alteration of awareness: Secondary | ICD-10-CM | POA: Diagnosis not present

## 2019-11-18 DIAGNOSIS — E1165 Type 2 diabetes mellitus with hyperglycemia: Secondary | ICD-10-CM | POA: Diagnosis not present

## 2019-11-18 DIAGNOSIS — R627 Adult failure to thrive: Secondary | ICD-10-CM | POA: Diagnosis not present

## 2019-11-18 DIAGNOSIS — M810 Age-related osteoporosis without current pathological fracture: Secondary | ICD-10-CM | POA: Diagnosis not present

## 2019-11-18 DIAGNOSIS — E872 Acidosis: Secondary | ICD-10-CM | POA: Diagnosis not present

## 2019-11-18 DIAGNOSIS — I82431 Acute embolism and thrombosis of right popliteal vein: Secondary | ICD-10-CM | POA: Diagnosis not present

## 2019-11-18 DIAGNOSIS — I129 Hypertensive chronic kidney disease with stage 1 through stage 4 chronic kidney disease, or unspecified chronic kidney disease: Secondary | ICD-10-CM | POA: Diagnosis not present

## 2019-11-18 DIAGNOSIS — R05 Cough: Secondary | ICD-10-CM | POA: Diagnosis not present

## 2019-11-18 DIAGNOSIS — M25561 Pain in right knee: Secondary | ICD-10-CM | POA: Diagnosis not present

## 2019-11-18 DIAGNOSIS — I82403 Acute embolism and thrombosis of unspecified deep veins of lower extremity, bilateral: Secondary | ICD-10-CM | POA: Diagnosis not present

## 2019-11-18 DIAGNOSIS — F0280 Dementia in other diseases classified elsewhere without behavioral disturbance: Secondary | ICD-10-CM | POA: Diagnosis not present

## 2019-11-18 DIAGNOSIS — I82421 Acute embolism and thrombosis of right iliac vein: Secondary | ICD-10-CM | POA: Diagnosis not present

## 2019-11-18 DIAGNOSIS — R6520 Severe sepsis without septic shock: Secondary | ICD-10-CM | POA: Diagnosis not present

## 2019-11-18 DIAGNOSIS — E782 Mixed hyperlipidemia: Secondary | ICD-10-CM | POA: Diagnosis not present

## 2019-11-18 DIAGNOSIS — I739 Peripheral vascular disease, unspecified: Secondary | ICD-10-CM | POA: Diagnosis not present

## 2019-11-18 DIAGNOSIS — J690 Pneumonitis due to inhalation of food and vomit: Secondary | ICD-10-CM | POA: Diagnosis not present

## 2019-11-18 DIAGNOSIS — N179 Acute kidney failure, unspecified: Secondary | ICD-10-CM | POA: Diagnosis not present

## 2019-11-18 DIAGNOSIS — R069 Unspecified abnormalities of breathing: Secondary | ICD-10-CM | POA: Diagnosis not present

## 2019-11-18 DIAGNOSIS — M791 Myalgia, unspecified site: Secondary | ICD-10-CM | POA: Diagnosis not present

## 2019-11-18 DIAGNOSIS — Z7189 Other specified counseling: Secondary | ICD-10-CM | POA: Diagnosis not present

## 2019-11-18 DIAGNOSIS — E1140 Type 2 diabetes mellitus with diabetic neuropathy, unspecified: Secondary | ICD-10-CM | POA: Diagnosis not present

## 2019-11-18 DIAGNOSIS — K219 Gastro-esophageal reflux disease without esophagitis: Secondary | ICD-10-CM | POA: Diagnosis not present

## 2019-11-18 DIAGNOSIS — G301 Alzheimer's disease with late onset: Secondary | ICD-10-CM | POA: Diagnosis not present

## 2019-11-18 DIAGNOSIS — E1159 Type 2 diabetes mellitus with other circulatory complications: Secondary | ICD-10-CM | POA: Diagnosis not present

## 2019-11-18 DIAGNOSIS — I248 Other forms of acute ischemic heart disease: Secondary | ICD-10-CM | POA: Diagnosis not present

## 2019-11-18 DIAGNOSIS — M7501 Adhesive capsulitis of right shoulder: Secondary | ICD-10-CM | POA: Diagnosis not present

## 2019-11-18 DIAGNOSIS — I82432 Acute embolism and thrombosis of left popliteal vein: Secondary | ICD-10-CM | POA: Diagnosis not present

## 2019-11-18 DIAGNOSIS — J8489 Other specified interstitial pulmonary diseases: Secondary | ICD-10-CM | POA: Diagnosis not present

## 2019-11-18 DIAGNOSIS — Z9889 Other specified postprocedural states: Secondary | ICD-10-CM | POA: Diagnosis not present

## 2019-11-18 DIAGNOSIS — J189 Pneumonia, unspecified organism: Secondary | ICD-10-CM | POA: Diagnosis not present

## 2019-11-18 DIAGNOSIS — G4730 Sleep apnea, unspecified: Secondary | ICD-10-CM | POA: Diagnosis not present

## 2019-11-18 DIAGNOSIS — R6510 Systemic inflammatory response syndrome (SIRS) of non-infectious origin without acute organ dysfunction: Secondary | ICD-10-CM | POA: Diagnosis not present

## 2019-11-18 DIAGNOSIS — I4891 Unspecified atrial fibrillation: Secondary | ICD-10-CM | POA: Diagnosis not present

## 2019-11-18 DIAGNOSIS — D631 Anemia in chronic kidney disease: Secondary | ICD-10-CM | POA: Diagnosis not present

## 2019-11-18 DIAGNOSIS — D62 Acute posthemorrhagic anemia: Secondary | ICD-10-CM | POA: Diagnosis not present

## 2019-11-18 DIAGNOSIS — M2550 Pain in unspecified joint: Secondary | ICD-10-CM | POA: Diagnosis not present

## 2019-11-18 DIAGNOSIS — E876 Hypokalemia: Secondary | ICD-10-CM | POA: Diagnosis not present

## 2019-11-18 DIAGNOSIS — R0902 Hypoxemia: Secondary | ICD-10-CM | POA: Diagnosis not present

## 2019-11-18 DIAGNOSIS — I10 Essential (primary) hypertension: Secondary | ICD-10-CM | POA: Diagnosis not present

## 2019-11-18 DIAGNOSIS — J181 Lobar pneumonia, unspecified organism: Secondary | ICD-10-CM | POA: Diagnosis not present

## 2019-11-18 DIAGNOSIS — N3001 Acute cystitis with hematuria: Secondary | ICD-10-CM | POA: Diagnosis not present

## 2019-11-18 DIAGNOSIS — I82422 Acute embolism and thrombosis of left iliac vein: Secondary | ICD-10-CM | POA: Diagnosis not present

## 2019-11-18 DIAGNOSIS — J449 Chronic obstructive pulmonary disease, unspecified: Secondary | ICD-10-CM | POA: Diagnosis not present

## 2019-11-18 DIAGNOSIS — N183 Chronic kidney disease, stage 3 unspecified: Secondary | ICD-10-CM | POA: Diagnosis not present

## 2019-11-18 DIAGNOSIS — Z7401 Bed confinement status: Secondary | ICD-10-CM | POA: Diagnosis not present

## 2019-11-19 DIAGNOSIS — J432 Centrilobular emphysema: Secondary | ICD-10-CM | POA: Diagnosis not present

## 2019-11-19 DIAGNOSIS — R5381 Other malaise: Secondary | ICD-10-CM | POA: Diagnosis not present

## 2019-11-19 DIAGNOSIS — R42 Dizziness and giddiness: Secondary | ICD-10-CM | POA: Diagnosis not present

## 2019-11-19 DIAGNOSIS — F4321 Adjustment disorder with depressed mood: Secondary | ICD-10-CM | POA: Diagnosis not present

## 2019-11-19 DIAGNOSIS — L03116 Cellulitis of left lower limb: Secondary | ICD-10-CM | POA: Diagnosis not present

## 2019-11-19 DIAGNOSIS — I739 Peripheral vascular disease, unspecified: Secondary | ICD-10-CM | POA: Diagnosis not present

## 2019-11-19 DIAGNOSIS — R278 Other lack of coordination: Secondary | ICD-10-CM | POA: Diagnosis not present

## 2019-11-19 DIAGNOSIS — M1712 Unilateral primary osteoarthritis, left knee: Secondary | ICD-10-CM | POA: Diagnosis not present

## 2019-11-19 DIAGNOSIS — I5033 Acute on chronic diastolic (congestive) heart failure: Secondary | ICD-10-CM | POA: Diagnosis not present

## 2019-11-19 DIAGNOSIS — F0391 Unspecified dementia with behavioral disturbance: Secondary | ICD-10-CM | POA: Diagnosis not present

## 2019-11-19 DIAGNOSIS — I495 Sick sinus syndrome: Secondary | ICD-10-CM | POA: Diagnosis not present

## 2019-11-19 DIAGNOSIS — S2241XD Multiple fractures of ribs, right side, subsequent encounter for fracture with routine healing: Secondary | ICD-10-CM | POA: Diagnosis not present

## 2019-11-19 DIAGNOSIS — M76819 Anterior tibial syndrome, unspecified leg: Secondary | ICD-10-CM | POA: Diagnosis not present

## 2019-11-19 DIAGNOSIS — K264 Chronic or unspecified duodenal ulcer with hemorrhage: Secondary | ICD-10-CM | POA: Diagnosis not present

## 2019-11-19 DIAGNOSIS — Z7982 Long term (current) use of aspirin: Secondary | ICD-10-CM | POA: Diagnosis not present

## 2019-11-19 DIAGNOSIS — N3944 Nocturnal enuresis: Secondary | ICD-10-CM | POA: Diagnosis not present

## 2019-11-19 DIAGNOSIS — E46 Unspecified protein-calorie malnutrition: Secondary | ICD-10-CM | POA: Diagnosis not present

## 2019-11-19 DIAGNOSIS — I5022 Chronic systolic (congestive) heart failure: Secondary | ICD-10-CM | POA: Diagnosis not present

## 2019-11-19 DIAGNOSIS — M5136 Other intervertebral disc degeneration, lumbar region: Secondary | ICD-10-CM | POA: Diagnosis not present

## 2019-11-19 DIAGNOSIS — I442 Atrioventricular block, complete: Secondary | ICD-10-CM | POA: Diagnosis not present

## 2019-11-19 DIAGNOSIS — Z7952 Long term (current) use of systemic steroids: Secondary | ICD-10-CM | POA: Diagnosis not present

## 2019-11-19 DIAGNOSIS — Z86718 Personal history of other venous thrombosis and embolism: Secondary | ICD-10-CM | POA: Diagnosis not present

## 2019-11-19 DIAGNOSIS — H409 Unspecified glaucoma: Secondary | ICD-10-CM | POA: Diagnosis not present

## 2019-11-19 DIAGNOSIS — R2689 Other abnormalities of gait and mobility: Secondary | ICD-10-CM | POA: Diagnosis not present

## 2019-11-19 DIAGNOSIS — N3001 Acute cystitis with hematuria: Secondary | ICD-10-CM | POA: Diagnosis not present

## 2019-11-19 DIAGNOSIS — R0600 Dyspnea, unspecified: Secondary | ICD-10-CM | POA: Diagnosis not present

## 2019-11-19 DIAGNOSIS — I959 Hypotension, unspecified: Secondary | ICD-10-CM | POA: Diagnosis not present

## 2019-11-19 DIAGNOSIS — J9600 Acute respiratory failure, unspecified whether with hypoxia or hypercapnia: Secondary | ICD-10-CM | POA: Diagnosis not present

## 2019-11-19 DIAGNOSIS — Z992 Dependence on renal dialysis: Secondary | ICD-10-CM | POA: Diagnosis not present

## 2019-11-19 DIAGNOSIS — M1711 Unilateral primary osteoarthritis, right knee: Secondary | ICD-10-CM | POA: Diagnosis not present

## 2019-11-19 DIAGNOSIS — J690 Pneumonitis due to inhalation of food and vomit: Secondary | ICD-10-CM | POA: Diagnosis not present

## 2019-11-19 DIAGNOSIS — R197 Diarrhea, unspecified: Secondary | ICD-10-CM | POA: Diagnosis not present

## 2019-11-19 DIAGNOSIS — Z791 Long term (current) use of non-steroidal anti-inflammatories (NSAID): Secondary | ICD-10-CM | POA: Diagnosis not present

## 2019-11-19 DIAGNOSIS — M069 Rheumatoid arthritis, unspecified: Secondary | ICD-10-CM | POA: Diagnosis not present

## 2019-11-19 DIAGNOSIS — E1122 Type 2 diabetes mellitus with diabetic chronic kidney disease: Secondary | ICD-10-CM | POA: Diagnosis not present

## 2019-11-19 DIAGNOSIS — E872 Acidosis: Secondary | ICD-10-CM | POA: Diagnosis not present

## 2019-11-19 DIAGNOSIS — I5040 Unspecified combined systolic (congestive) and diastolic (congestive) heart failure: Secondary | ICD-10-CM | POA: Diagnosis not present

## 2019-11-19 DIAGNOSIS — E1142 Type 2 diabetes mellitus with diabetic polyneuropathy: Secondary | ICD-10-CM | POA: Diagnosis not present

## 2019-11-19 DIAGNOSIS — I447 Left bundle-branch block, unspecified: Secondary | ICD-10-CM | POA: Diagnosis not present

## 2019-11-19 DIAGNOSIS — J181 Lobar pneumonia, unspecified organism: Secondary | ICD-10-CM | POA: Diagnosis not present

## 2019-11-19 DIAGNOSIS — G43909 Migraine, unspecified, not intractable, without status migrainosus: Secondary | ICD-10-CM | POA: Diagnosis not present

## 2019-11-19 DIAGNOSIS — R0902 Hypoxemia: Secondary | ICD-10-CM | POA: Diagnosis not present

## 2019-11-19 DIAGNOSIS — J42 Unspecified chronic bronchitis: Secondary | ICD-10-CM | POA: Diagnosis not present

## 2019-11-19 DIAGNOSIS — C3491 Malignant neoplasm of unspecified part of right bronchus or lung: Secondary | ICD-10-CM | POA: Diagnosis not present

## 2019-11-19 DIAGNOSIS — Z87891 Personal history of nicotine dependence: Secondary | ICD-10-CM | POA: Diagnosis not present

## 2019-11-19 DIAGNOSIS — N1832 Chronic kidney disease, stage 3b: Secondary | ICD-10-CM | POA: Diagnosis not present

## 2019-11-19 DIAGNOSIS — N179 Acute kidney failure, unspecified: Secondary | ICD-10-CM | POA: Diagnosis not present

## 2019-11-19 DIAGNOSIS — I5030 Unspecified diastolic (congestive) heart failure: Secondary | ICD-10-CM | POA: Diagnosis not present

## 2019-11-19 DIAGNOSIS — N184 Chronic kidney disease, stage 4 (severe): Secondary | ICD-10-CM | POA: Diagnosis not present

## 2019-11-19 DIAGNOSIS — Z794 Long term (current) use of insulin: Secondary | ICD-10-CM | POA: Diagnosis not present

## 2019-11-19 DIAGNOSIS — J439 Emphysema, unspecified: Secondary | ICD-10-CM | POA: Diagnosis not present

## 2019-11-19 DIAGNOSIS — K7460 Unspecified cirrhosis of liver: Secondary | ICD-10-CM | POA: Diagnosis not present

## 2019-11-19 DIAGNOSIS — H5704 Mydriasis: Secondary | ICD-10-CM | POA: Diagnosis not present

## 2019-11-19 DIAGNOSIS — G629 Polyneuropathy, unspecified: Secondary | ICD-10-CM | POA: Diagnosis not present

## 2019-11-19 DIAGNOSIS — R7303 Prediabetes: Secondary | ICD-10-CM | POA: Diagnosis not present

## 2019-11-19 DIAGNOSIS — Z8673 Personal history of transient ischemic attack (TIA), and cerebral infarction without residual deficits: Secondary | ICD-10-CM | POA: Diagnosis not present

## 2019-11-19 DIAGNOSIS — E038 Other specified hypothyroidism: Secondary | ICD-10-CM | POA: Diagnosis not present

## 2019-11-19 DIAGNOSIS — E162 Hypoglycemia, unspecified: Secondary | ICD-10-CM | POA: Diagnosis not present

## 2019-11-19 DIAGNOSIS — T827XXD Infection and inflammatory reaction due to other cardiac and vascular devices, implants and grafts, subsequent encounter: Secondary | ICD-10-CM | POA: Diagnosis not present

## 2019-11-19 DIAGNOSIS — L89310 Pressure ulcer of right buttock, unstageable: Secondary | ICD-10-CM | POA: Diagnosis not present

## 2019-11-19 DIAGNOSIS — D62 Acute posthemorrhagic anemia: Secondary | ICD-10-CM | POA: Diagnosis not present

## 2019-11-19 DIAGNOSIS — I5043 Acute on chronic combined systolic (congestive) and diastolic (congestive) heart failure: Secondary | ICD-10-CM | POA: Diagnosis not present

## 2019-11-19 DIAGNOSIS — Z79899 Other long term (current) drug therapy: Secondary | ICD-10-CM | POA: Diagnosis not present

## 2019-11-19 DIAGNOSIS — E669 Obesity, unspecified: Secondary | ICD-10-CM | POA: Diagnosis not present

## 2019-11-19 DIAGNOSIS — I69398 Other sequelae of cerebral infarction: Secondary | ICD-10-CM | POA: Diagnosis not present

## 2019-11-19 DIAGNOSIS — K56609 Unspecified intestinal obstruction, unspecified as to partial versus complete obstruction: Secondary | ICD-10-CM | POA: Diagnosis not present

## 2019-11-19 DIAGNOSIS — I130 Hypertensive heart and chronic kidney disease with heart failure and stage 1 through stage 4 chronic kidney disease, or unspecified chronic kidney disease: Secondary | ICD-10-CM | POA: Diagnosis not present

## 2019-11-19 DIAGNOSIS — I2699 Other pulmonary embolism without acute cor pulmonale: Secondary | ICD-10-CM | POA: Diagnosis not present

## 2019-11-19 DIAGNOSIS — M150 Primary generalized (osteo)arthritis: Secondary | ICD-10-CM | POA: Diagnosis not present

## 2019-11-19 DIAGNOSIS — J4520 Mild intermittent asthma, uncomplicated: Secondary | ICD-10-CM | POA: Diagnosis not present

## 2019-11-19 DIAGNOSIS — Z20822 Contact with and (suspected) exposure to covid-19: Secondary | ICD-10-CM | POA: Diagnosis not present

## 2019-11-19 DIAGNOSIS — I2510 Atherosclerotic heart disease of native coronary artery without angina pectoris: Secondary | ICD-10-CM | POA: Diagnosis not present

## 2019-11-19 DIAGNOSIS — I132 Hypertensive heart and chronic kidney disease with heart failure and with stage 5 chronic kidney disease, or end stage renal disease: Secondary | ICD-10-CM | POA: Diagnosis not present

## 2019-11-19 DIAGNOSIS — Z743 Need for continuous supervision: Secondary | ICD-10-CM | POA: Diagnosis not present

## 2019-11-19 DIAGNOSIS — R339 Retention of urine, unspecified: Secondary | ICD-10-CM | POA: Diagnosis not present

## 2019-11-19 DIAGNOSIS — I272 Pulmonary hypertension, unspecified: Secondary | ICD-10-CM | POA: Diagnosis not present

## 2019-11-19 DIAGNOSIS — I10 Essential (primary) hypertension: Secondary | ICD-10-CM | POA: Diagnosis not present

## 2019-11-19 DIAGNOSIS — R188 Other ascites: Secondary | ICD-10-CM | POA: Diagnosis not present

## 2019-11-19 DIAGNOSIS — C189 Malignant neoplasm of colon, unspecified: Secondary | ICD-10-CM | POA: Diagnosis not present

## 2019-11-19 DIAGNOSIS — D509 Iron deficiency anemia, unspecified: Secondary | ICD-10-CM | POA: Diagnosis not present

## 2019-11-19 DIAGNOSIS — I69344 Monoplegia of lower limb following cerebral infarction affecting left non-dominant side: Secondary | ICD-10-CM | POA: Diagnosis not present

## 2019-11-19 DIAGNOSIS — J9601 Acute respiratory failure with hypoxia: Secondary | ICD-10-CM | POA: Diagnosis not present

## 2019-11-19 DIAGNOSIS — I700 Atherosclerosis of aorta: Secondary | ICD-10-CM | POA: Diagnosis not present

## 2019-11-19 DIAGNOSIS — Z853 Personal history of malignant neoplasm of breast: Secondary | ICD-10-CM | POA: Diagnosis not present

## 2019-11-19 DIAGNOSIS — J449 Chronic obstructive pulmonary disease, unspecified: Secondary | ICD-10-CM | POA: Diagnosis not present

## 2019-11-19 DIAGNOSIS — G7281 Critical illness myopathy: Secondary | ICD-10-CM | POA: Diagnosis not present

## 2019-11-19 DIAGNOSIS — E782 Mixed hyperlipidemia: Secondary | ICD-10-CM | POA: Diagnosis not present

## 2019-11-19 DIAGNOSIS — I4821 Permanent atrial fibrillation: Secondary | ICD-10-CM | POA: Diagnosis not present

## 2019-11-19 DIAGNOSIS — I82403 Acute embolism and thrombosis of unspecified deep veins of lower extremity, bilateral: Secondary | ICD-10-CM | POA: Diagnosis not present

## 2019-11-19 DIAGNOSIS — K5901 Slow transit constipation: Secondary | ICD-10-CM | POA: Diagnosis not present

## 2019-11-19 DIAGNOSIS — M797 Fibromyalgia: Secondary | ICD-10-CM | POA: Diagnosis not present

## 2019-11-19 DIAGNOSIS — F331 Major depressive disorder, recurrent, moderate: Secondary | ICD-10-CM | POA: Diagnosis not present

## 2019-11-19 DIAGNOSIS — R45851 Suicidal ideations: Secondary | ICD-10-CM | POA: Diagnosis not present

## 2019-11-19 DIAGNOSIS — K31819 Angiodysplasia of stomach and duodenum without bleeding: Secondary | ICD-10-CM | POA: Diagnosis not present

## 2019-11-19 DIAGNOSIS — R269 Unspecified abnormalities of gait and mobility: Secondary | ICD-10-CM | POA: Diagnosis not present

## 2019-11-19 DIAGNOSIS — M62552 Muscle wasting and atrophy, not elsewhere classified, left thigh: Secondary | ICD-10-CM | POA: Diagnosis not present

## 2019-11-19 DIAGNOSIS — J9611 Chronic respiratory failure with hypoxia: Secondary | ICD-10-CM | POA: Diagnosis not present

## 2019-11-19 DIAGNOSIS — R6520 Severe sepsis without septic shock: Secondary | ICD-10-CM | POA: Diagnosis not present

## 2019-11-19 DIAGNOSIS — I340 Nonrheumatic mitral (valve) insufficiency: Secondary | ICD-10-CM | POA: Diagnosis not present

## 2019-11-19 DIAGNOSIS — Z96652 Presence of left artificial knee joint: Secondary | ICD-10-CM | POA: Diagnosis not present

## 2019-11-19 DIAGNOSIS — N170 Acute kidney failure with tubular necrosis: Secondary | ICD-10-CM | POA: Diagnosis not present

## 2019-11-19 DIAGNOSIS — F322 Major depressive disorder, single episode, severe without psychotic features: Secondary | ICD-10-CM | POA: Diagnosis not present

## 2019-11-19 DIAGNOSIS — M4802 Spinal stenosis, cervical region: Secondary | ICD-10-CM | POA: Diagnosis not present

## 2019-11-19 DIAGNOSIS — I110 Hypertensive heart disease with heart failure: Secondary | ICD-10-CM | POA: Diagnosis not present

## 2019-11-19 DIAGNOSIS — Z9012 Acquired absence of left breast and nipple: Secondary | ICD-10-CM | POA: Diagnosis not present

## 2019-11-19 DIAGNOSIS — Z9181 History of falling: Secondary | ICD-10-CM | POA: Diagnosis not present

## 2019-11-19 DIAGNOSIS — I129 Hypertensive chronic kidney disease with stage 1 through stage 4 chronic kidney disease, or unspecified chronic kidney disease: Secondary | ICD-10-CM | POA: Diagnosis not present

## 2019-11-19 DIAGNOSIS — E876 Hypokalemia: Secondary | ICD-10-CM | POA: Diagnosis not present

## 2019-11-19 DIAGNOSIS — N185 Chronic kidney disease, stage 5: Secondary | ICD-10-CM | POA: Diagnosis not present

## 2019-11-19 DIAGNOSIS — D3000 Benign neoplasm of unspecified kidney: Secondary | ICD-10-CM | POA: Diagnosis not present

## 2019-11-19 DIAGNOSIS — E7849 Other hyperlipidemia: Secondary | ICD-10-CM | POA: Diagnosis not present

## 2019-11-19 DIAGNOSIS — S72012D Unspecified intracapsular fracture of left femur, subsequent encounter for closed fracture with routine healing: Secondary | ICD-10-CM | POA: Diagnosis not present

## 2019-11-19 DIAGNOSIS — M5416 Radiculopathy, lumbar region: Secondary | ICD-10-CM | POA: Diagnosis not present

## 2019-11-19 DIAGNOSIS — D1803 Hemangioma of intra-abdominal structures: Secondary | ICD-10-CM | POA: Diagnosis not present

## 2019-11-19 DIAGNOSIS — D631 Anemia in chronic kidney disease: Secondary | ICD-10-CM | POA: Diagnosis not present

## 2019-11-19 DIAGNOSIS — J1289 Other viral pneumonia: Secondary | ICD-10-CM | POA: Diagnosis not present

## 2019-11-19 DIAGNOSIS — E1140 Type 2 diabetes mellitus with diabetic neuropathy, unspecified: Secondary | ICD-10-CM | POA: Diagnosis not present

## 2019-11-19 DIAGNOSIS — M5116 Intervertebral disc disorders with radiculopathy, lumbar region: Secondary | ICD-10-CM | POA: Diagnosis not present

## 2019-11-19 DIAGNOSIS — I872 Venous insufficiency (chronic) (peripheral): Secondary | ICD-10-CM | POA: Diagnosis not present

## 2019-11-19 DIAGNOSIS — K922 Gastrointestinal hemorrhage, unspecified: Secondary | ICD-10-CM | POA: Diagnosis not present

## 2019-11-19 DIAGNOSIS — F988 Other specified behavioral and emotional disorders with onset usually occurring in childhood and adolescence: Secondary | ICD-10-CM | POA: Diagnosis not present

## 2019-11-19 DIAGNOSIS — A419 Sepsis, unspecified organism: Secondary | ICD-10-CM | POA: Diagnosis not present

## 2019-11-19 DIAGNOSIS — D126 Benign neoplasm of colon, unspecified: Secondary | ICD-10-CM | POA: Diagnosis not present

## 2019-11-19 DIAGNOSIS — G8929 Other chronic pain: Secondary | ICD-10-CM | POA: Diagnosis not present

## 2019-11-19 DIAGNOSIS — K219 Gastro-esophageal reflux disease without esophagitis: Secondary | ICD-10-CM | POA: Diagnosis not present

## 2019-11-19 DIAGNOSIS — Z466 Encounter for fitting and adjustment of urinary device: Secondary | ICD-10-CM | POA: Diagnosis not present

## 2019-11-19 DIAGNOSIS — Z23 Encounter for immunization: Secondary | ICD-10-CM | POA: Diagnosis not present

## 2019-11-19 DIAGNOSIS — E1165 Type 2 diabetes mellitus with hyperglycemia: Secondary | ICD-10-CM | POA: Diagnosis not present

## 2019-11-19 DIAGNOSIS — K5732 Diverticulitis of large intestine without perforation or abscess without bleeding: Secondary | ICD-10-CM | POA: Diagnosis not present

## 2019-11-19 DIAGNOSIS — E871 Hypo-osmolality and hyponatremia: Secondary | ICD-10-CM | POA: Diagnosis not present

## 2019-11-19 DIAGNOSIS — M1990 Unspecified osteoarthritis, unspecified site: Secondary | ICD-10-CM | POA: Diagnosis not present

## 2019-11-19 DIAGNOSIS — E119 Type 2 diabetes mellitus without complications: Secondary | ICD-10-CM | POA: Diagnosis not present

## 2019-11-19 DIAGNOSIS — J90 Pleural effusion, not elsewhere classified: Secondary | ICD-10-CM | POA: Diagnosis not present

## 2019-11-19 DIAGNOSIS — I5032 Chronic diastolic (congestive) heart failure: Secondary | ICD-10-CM | POA: Diagnosis not present

## 2019-11-19 DIAGNOSIS — R270 Ataxia, unspecified: Secondary | ICD-10-CM | POA: Diagnosis not present

## 2019-11-19 DIAGNOSIS — G2581 Restless legs syndrome: Secondary | ICD-10-CM | POA: Diagnosis not present

## 2019-11-19 DIAGNOSIS — I509 Heart failure, unspecified: Secondary | ICD-10-CM | POA: Diagnosis not present

## 2019-11-19 DIAGNOSIS — N8111 Cystocele, midline: Secondary | ICD-10-CM | POA: Diagnosis not present

## 2019-11-19 DIAGNOSIS — R3914 Feeling of incomplete bladder emptying: Secondary | ICD-10-CM | POA: Diagnosis not present

## 2019-11-19 DIAGNOSIS — R2681 Unsteadiness on feet: Secondary | ICD-10-CM | POA: Diagnosis not present

## 2019-11-19 DIAGNOSIS — K279 Peptic ulcer, site unspecified, unspecified as acute or chronic, without hemorrhage or perforation: Secondary | ICD-10-CM | POA: Diagnosis not present

## 2019-11-19 DIAGNOSIS — J45909 Unspecified asthma, uncomplicated: Secondary | ICD-10-CM | POA: Diagnosis not present

## 2019-11-19 DIAGNOSIS — I083 Combined rheumatic disorders of mitral, aortic and tricuspid valves: Secondary | ICD-10-CM | POA: Diagnosis not present

## 2019-11-19 DIAGNOSIS — I69351 Hemiplegia and hemiparesis following cerebral infarction affecting right dominant side: Secondary | ICD-10-CM | POA: Diagnosis not present

## 2019-11-19 DIAGNOSIS — I69354 Hemiplegia and hemiparesis following cerebral infarction affecting left non-dominant side: Secondary | ICD-10-CM | POA: Diagnosis not present

## 2019-11-19 DIAGNOSIS — M353 Polymyalgia rheumatica: Secondary | ICD-10-CM | POA: Diagnosis not present

## 2019-11-19 DIAGNOSIS — R296 Repeated falls: Secondary | ICD-10-CM | POA: Diagnosis not present

## 2019-11-19 DIAGNOSIS — M1030 Gout due to renal impairment, unspecified site: Secondary | ICD-10-CM | POA: Diagnosis not present

## 2019-11-19 DIAGNOSIS — Z9889 Other specified postprocedural states: Secondary | ICD-10-CM | POA: Diagnosis not present

## 2019-11-19 DIAGNOSIS — J9621 Acute and chronic respiratory failure with hypoxia: Secondary | ICD-10-CM | POA: Diagnosis not present

## 2019-11-19 DIAGNOSIS — F329 Major depressive disorder, single episode, unspecified: Secondary | ICD-10-CM | POA: Diagnosis not present

## 2019-11-19 DIAGNOSIS — K5790 Diverticulosis of intestine, part unspecified, without perforation or abscess without bleeding: Secondary | ICD-10-CM | POA: Diagnosis not present

## 2019-11-19 DIAGNOSIS — M1A30X0 Chronic gout due to renal impairment, unspecified site, without tophus (tophi): Secondary | ICD-10-CM | POA: Diagnosis not present

## 2019-11-19 DIAGNOSIS — G894 Chronic pain syndrome: Secondary | ICD-10-CM | POA: Diagnosis not present

## 2019-11-19 DIAGNOSIS — G300 Alzheimer's disease with early onset: Secondary | ICD-10-CM | POA: Diagnosis not present

## 2019-11-19 DIAGNOSIS — E1151 Type 2 diabetes mellitus with diabetic peripheral angiopathy without gangrene: Secondary | ICD-10-CM | POA: Diagnosis not present

## 2019-11-19 DIAGNOSIS — R293 Abnormal posture: Secondary | ICD-10-CM | POA: Diagnosis not present

## 2019-11-19 DIAGNOSIS — N182 Chronic kidney disease, stage 2 (mild): Secondary | ICD-10-CM | POA: Diagnosis not present

## 2019-11-19 DIAGNOSIS — I480 Paroxysmal atrial fibrillation: Secondary | ICD-10-CM | POA: Diagnosis not present

## 2019-11-19 DIAGNOSIS — F0280 Dementia in other diseases classified elsewhere without behavioral disturbance: Secondary | ICD-10-CM | POA: Diagnosis not present

## 2019-11-19 DIAGNOSIS — U071 COVID-19: Secondary | ICD-10-CM | POA: Diagnosis not present

## 2019-11-19 DIAGNOSIS — Z79891 Long term (current) use of opiate analgesic: Secondary | ICD-10-CM | POA: Diagnosis not present

## 2019-11-19 DIAGNOSIS — M109 Gout, unspecified: Secondary | ICD-10-CM | POA: Diagnosis not present

## 2019-11-19 DIAGNOSIS — M5030 Other cervical disc degeneration, unspecified cervical region: Secondary | ICD-10-CM | POA: Diagnosis not present

## 2019-11-19 DIAGNOSIS — M810 Age-related osteoporosis without current pathological fracture: Secondary | ICD-10-CM | POA: Diagnosis not present

## 2019-11-19 DIAGNOSIS — R1310 Dysphagia, unspecified: Secondary | ICD-10-CM | POA: Diagnosis not present

## 2019-11-19 DIAGNOSIS — E1169 Type 2 diabetes mellitus with other specified complication: Secondary | ICD-10-CM | POA: Diagnosis not present

## 2019-11-19 DIAGNOSIS — J189 Pneumonia, unspecified organism: Secondary | ICD-10-CM | POA: Diagnosis not present

## 2019-11-19 DIAGNOSIS — K5730 Diverticulosis of large intestine without perforation or abscess without bleeding: Secondary | ICD-10-CM | POA: Diagnosis not present

## 2019-11-19 DIAGNOSIS — R1013 Epigastric pain: Secondary | ICD-10-CM | POA: Diagnosis not present

## 2019-11-19 DIAGNOSIS — J849 Interstitial pulmonary disease, unspecified: Secondary | ICD-10-CM | POA: Diagnosis not present

## 2019-11-19 DIAGNOSIS — R6510 Systemic inflammatory response syndrome (SIRS) of non-infectious origin without acute organ dysfunction: Secondary | ICD-10-CM | POA: Diagnosis not present

## 2019-11-19 DIAGNOSIS — G4730 Sleep apnea, unspecified: Secondary | ICD-10-CM | POA: Diagnosis not present

## 2019-11-19 DIAGNOSIS — E039 Hypothyroidism, unspecified: Secondary | ICD-10-CM | POA: Diagnosis not present

## 2019-11-19 DIAGNOSIS — K529 Noninfective gastroenteritis and colitis, unspecified: Secondary | ICD-10-CM | POA: Diagnosis not present

## 2019-11-19 DIAGNOSIS — W19XXXA Unspecified fall, initial encounter: Secondary | ICD-10-CM | POA: Diagnosis not present

## 2019-11-19 DIAGNOSIS — M1710 Unilateral primary osteoarthritis, unspecified knee: Secondary | ICD-10-CM | POA: Diagnosis not present

## 2019-11-19 DIAGNOSIS — T8131XA Disruption of external operation (surgical) wound, not elsewhere classified, initial encounter: Secondary | ICD-10-CM | POA: Diagnosis not present

## 2019-11-19 DIAGNOSIS — E0842 Diabetes mellitus due to underlying condition with diabetic polyneuropathy: Secondary | ICD-10-CM | POA: Diagnosis not present

## 2019-11-19 DIAGNOSIS — E785 Hyperlipidemia, unspecified: Secondary | ICD-10-CM | POA: Diagnosis not present

## 2019-11-19 DIAGNOSIS — R0689 Other abnormalities of breathing: Secondary | ICD-10-CM | POA: Diagnosis not present

## 2019-11-19 DIAGNOSIS — Z96651 Presence of right artificial knee joint: Secondary | ICD-10-CM | POA: Diagnosis not present

## 2019-11-19 DIAGNOSIS — I4891 Unspecified atrial fibrillation: Secondary | ICD-10-CM | POA: Diagnosis not present

## 2019-11-19 DIAGNOSIS — N2581 Secondary hyperparathyroidism of renal origin: Secondary | ICD-10-CM | POA: Diagnosis not present

## 2019-11-19 DIAGNOSIS — I252 Old myocardial infarction: Secondary | ICD-10-CM | POA: Diagnosis not present

## 2019-11-19 DIAGNOSIS — K5900 Constipation, unspecified: Secondary | ICD-10-CM | POA: Diagnosis not present

## 2019-11-19 DIAGNOSIS — N183 Chronic kidney disease, stage 3 unspecified: Secondary | ICD-10-CM | POA: Diagnosis not present

## 2019-11-19 DIAGNOSIS — R471 Dysarthria and anarthria: Secondary | ICD-10-CM | POA: Diagnosis not present

## 2019-11-19 DIAGNOSIS — F419 Anxiety disorder, unspecified: Secondary | ICD-10-CM | POA: Diagnosis not present

## 2019-11-19 DIAGNOSIS — M25551 Pain in right hip: Secondary | ICD-10-CM | POA: Diagnosis not present

## 2019-11-19 DIAGNOSIS — I2721 Secondary pulmonary arterial hypertension: Secondary | ICD-10-CM | POA: Diagnosis not present

## 2019-11-19 DIAGNOSIS — Z48812 Encounter for surgical aftercare following surgery on the circulatory system: Secondary | ICD-10-CM | POA: Diagnosis not present

## 2019-11-19 DIAGNOSIS — N186 End stage renal disease: Secondary | ICD-10-CM | POA: Diagnosis not present

## 2019-11-19 DIAGNOSIS — G4733 Obstructive sleep apnea (adult) (pediatric): Secondary | ICD-10-CM | POA: Diagnosis not present

## 2019-11-19 DIAGNOSIS — M6281 Muscle weakness (generalized): Secondary | ICD-10-CM | POA: Diagnosis not present

## 2019-11-19 DIAGNOSIS — G301 Alzheimer's disease with late onset: Secondary | ICD-10-CM | POA: Diagnosis not present

## 2019-11-19 DIAGNOSIS — E079 Disorder of thyroid, unspecified: Secondary | ICD-10-CM | POA: Diagnosis not present

## 2019-11-19 DIAGNOSIS — I255 Ischemic cardiomyopathy: Secondary | ICD-10-CM | POA: Diagnosis not present

## 2019-11-19 DIAGNOSIS — I428 Other cardiomyopathies: Secondary | ICD-10-CM | POA: Diagnosis not present

## 2019-11-19 DIAGNOSIS — D649 Anemia, unspecified: Secondary | ICD-10-CM | POA: Diagnosis not present

## 2019-11-19 DIAGNOSIS — F411 Generalized anxiety disorder: Secondary | ICD-10-CM | POA: Diagnosis not present

## 2019-11-19 DIAGNOSIS — Z471 Aftercare following joint replacement surgery: Secondary | ICD-10-CM | POA: Diagnosis not present

## 2019-11-19 DIAGNOSIS — D61818 Other pancytopenia: Secondary | ICD-10-CM | POA: Diagnosis not present

## 2019-11-19 DIAGNOSIS — K921 Melena: Secondary | ICD-10-CM | POA: Diagnosis not present

## 2019-11-20 DIAGNOSIS — E860 Dehydration: Secondary | ICD-10-CM | POA: Diagnosis not present

## 2019-11-20 DIAGNOSIS — D62 Acute posthemorrhagic anemia: Secondary | ICD-10-CM | POA: Diagnosis not present

## 2019-11-20 DIAGNOSIS — F17210 Nicotine dependence, cigarettes, uncomplicated: Secondary | ICD-10-CM | POA: Diagnosis not present

## 2019-11-20 DIAGNOSIS — N186 End stage renal disease: Secondary | ICD-10-CM | POA: Diagnosis not present

## 2019-11-20 DIAGNOSIS — E669 Obesity, unspecified: Secondary | ICD-10-CM | POA: Diagnosis not present

## 2019-11-20 DIAGNOSIS — R6510 Systemic inflammatory response syndrome (SIRS) of non-infectious origin without acute organ dysfunction: Secondary | ICD-10-CM | POA: Diagnosis not present

## 2019-11-20 DIAGNOSIS — Z853 Personal history of malignant neoplasm of breast: Secondary | ICD-10-CM | POA: Diagnosis not present

## 2019-11-20 DIAGNOSIS — D689 Coagulation defect, unspecified: Secondary | ICD-10-CM | POA: Diagnosis not present

## 2019-11-20 DIAGNOSIS — N179 Acute kidney failure, unspecified: Secondary | ICD-10-CM | POA: Diagnosis not present

## 2019-11-20 DIAGNOSIS — Z1211 Encounter for screening for malignant neoplasm of colon: Secondary | ICD-10-CM | POA: Diagnosis not present

## 2019-11-20 DIAGNOSIS — U071 COVID-19: Secondary | ICD-10-CM | POA: Diagnosis not present

## 2019-11-20 DIAGNOSIS — Z794 Long term (current) use of insulin: Secondary | ICD-10-CM | POA: Diagnosis not present

## 2019-11-20 DIAGNOSIS — Z20828 Contact with and (suspected) exposure to other viral communicable diseases: Secondary | ICD-10-CM | POA: Diagnosis not present

## 2019-11-20 DIAGNOSIS — K7460 Unspecified cirrhosis of liver: Secondary | ICD-10-CM | POA: Diagnosis not present

## 2019-11-20 DIAGNOSIS — F0280 Dementia in other diseases classified elsewhere without behavioral disturbance: Secondary | ICD-10-CM | POA: Diagnosis not present

## 2019-11-20 DIAGNOSIS — N3000 Acute cystitis without hematuria: Secondary | ICD-10-CM | POA: Diagnosis not present

## 2019-11-20 DIAGNOSIS — E876 Hypokalemia: Secondary | ICD-10-CM | POA: Diagnosis not present

## 2019-11-20 DIAGNOSIS — J9601 Acute respiratory failure with hypoxia: Secondary | ICD-10-CM | POA: Diagnosis not present

## 2019-11-20 DIAGNOSIS — Z7982 Long term (current) use of aspirin: Secondary | ICD-10-CM | POA: Diagnosis not present

## 2019-11-20 DIAGNOSIS — D649 Anemia, unspecified: Secondary | ICD-10-CM | POA: Diagnosis not present

## 2019-11-20 DIAGNOSIS — M5136 Other intervertebral disc degeneration, lumbar region: Secondary | ICD-10-CM | POA: Diagnosis not present

## 2019-11-20 DIAGNOSIS — Z23 Encounter for immunization: Secondary | ICD-10-CM | POA: Diagnosis not present

## 2019-11-20 DIAGNOSIS — E039 Hypothyroidism, unspecified: Secondary | ICD-10-CM | POA: Diagnosis not present

## 2019-11-20 DIAGNOSIS — K8309 Other cholangitis: Secondary | ICD-10-CM | POA: Diagnosis not present

## 2019-11-20 DIAGNOSIS — I82403 Acute embolism and thrombosis of unspecified deep veins of lower extremity, bilateral: Secondary | ICD-10-CM | POA: Diagnosis not present

## 2019-11-20 DIAGNOSIS — E162 Hypoglycemia, unspecified: Secondary | ICD-10-CM | POA: Diagnosis not present

## 2019-11-20 DIAGNOSIS — I480 Paroxysmal atrial fibrillation: Secondary | ICD-10-CM | POA: Diagnosis not present

## 2019-11-20 DIAGNOSIS — K56609 Unspecified intestinal obstruction, unspecified as to partial versus complete obstruction: Secondary | ICD-10-CM | POA: Diagnosis not present

## 2019-11-20 DIAGNOSIS — E559 Vitamin D deficiency, unspecified: Secondary | ICD-10-CM | POA: Diagnosis not present

## 2019-11-20 DIAGNOSIS — I4891 Unspecified atrial fibrillation: Secondary | ICD-10-CM | POA: Diagnosis not present

## 2019-11-20 DIAGNOSIS — E871 Hypo-osmolality and hyponatremia: Secondary | ICD-10-CM | POA: Diagnosis not present

## 2019-11-20 DIAGNOSIS — Z9889 Other specified postprocedural states: Secondary | ICD-10-CM | POA: Diagnosis not present

## 2019-11-20 DIAGNOSIS — I6389 Other cerebral infarction: Secondary | ICD-10-CM | POA: Diagnosis not present

## 2019-11-20 DIAGNOSIS — E872 Acidosis: Secondary | ICD-10-CM | POA: Diagnosis not present

## 2019-11-20 DIAGNOSIS — Z9181 History of falling: Secondary | ICD-10-CM | POA: Diagnosis not present

## 2019-11-20 DIAGNOSIS — G3189 Other specified degenerative diseases of nervous system: Secondary | ICD-10-CM | POA: Diagnosis not present

## 2019-11-20 DIAGNOSIS — Z87891 Personal history of nicotine dependence: Secondary | ICD-10-CM | POA: Diagnosis not present

## 2019-11-20 DIAGNOSIS — R9082 White matter disease, unspecified: Secondary | ICD-10-CM | POA: Diagnosis not present

## 2019-11-20 DIAGNOSIS — R1310 Dysphagia, unspecified: Secondary | ICD-10-CM | POA: Diagnosis not present

## 2019-11-20 DIAGNOSIS — J181 Lobar pneumonia, unspecified organism: Secondary | ICD-10-CM | POA: Diagnosis not present

## 2019-11-20 DIAGNOSIS — N189 Chronic kidney disease, unspecified: Secondary | ICD-10-CM | POA: Diagnosis not present

## 2019-11-20 DIAGNOSIS — R197 Diarrhea, unspecified: Secondary | ICD-10-CM | POA: Diagnosis not present

## 2019-11-20 DIAGNOSIS — E1169 Type 2 diabetes mellitus with other specified complication: Secondary | ICD-10-CM | POA: Diagnosis not present

## 2019-11-20 DIAGNOSIS — N2581 Secondary hyperparathyroidism of renal origin: Secondary | ICD-10-CM | POA: Diagnosis not present

## 2019-11-20 DIAGNOSIS — A4150 Gram-negative sepsis, unspecified: Secondary | ICD-10-CM | POA: Diagnosis not present

## 2019-11-20 DIAGNOSIS — A419 Sepsis, unspecified organism: Secondary | ICD-10-CM | POA: Diagnosis not present

## 2019-11-20 DIAGNOSIS — J90 Pleural effusion, not elsewhere classified: Secondary | ICD-10-CM | POA: Diagnosis not present

## 2019-11-20 DIAGNOSIS — J9621 Acute and chronic respiratory failure with hypoxia: Secondary | ICD-10-CM | POA: Diagnosis not present

## 2019-11-20 DIAGNOSIS — Z471 Aftercare following joint replacement surgery: Secondary | ICD-10-CM | POA: Diagnosis not present

## 2019-11-20 DIAGNOSIS — N170 Acute kidney failure with tubular necrosis: Secondary | ICD-10-CM | POA: Diagnosis not present

## 2019-11-20 DIAGNOSIS — J189 Pneumonia, unspecified organism: Secondary | ICD-10-CM | POA: Diagnosis not present

## 2019-11-20 DIAGNOSIS — Z992 Dependence on renal dialysis: Secondary | ICD-10-CM | POA: Diagnosis not present

## 2019-11-20 DIAGNOSIS — I63512 Cerebral infarction due to unspecified occlusion or stenosis of left middle cerebral artery: Secondary | ICD-10-CM | POA: Diagnosis not present

## 2019-11-20 DIAGNOSIS — J690 Pneumonitis due to inhalation of food and vomit: Secondary | ICD-10-CM | POA: Diagnosis not present

## 2019-11-20 DIAGNOSIS — I2699 Other pulmonary embolism without acute cor pulmonale: Secondary | ICD-10-CM | POA: Diagnosis not present

## 2019-11-20 DIAGNOSIS — R6520 Severe sepsis without septic shock: Secondary | ICD-10-CM | POA: Diagnosis not present

## 2019-11-20 DIAGNOSIS — N1832 Chronic kidney disease, stage 3b: Secondary | ICD-10-CM | POA: Diagnosis not present

## 2019-11-20 DIAGNOSIS — Z03818 Encounter for observation for suspected exposure to other biological agents ruled out: Secondary | ICD-10-CM | POA: Diagnosis not present

## 2019-11-20 DIAGNOSIS — I10 Essential (primary) hypertension: Secondary | ICD-10-CM | POA: Diagnosis not present

## 2019-11-20 DIAGNOSIS — Z8249 Family history of ischemic heart disease and other diseases of the circulatory system: Secondary | ICD-10-CM | POA: Diagnosis not present

## 2019-11-20 DIAGNOSIS — E1140 Type 2 diabetes mellitus with diabetic neuropathy, unspecified: Secondary | ICD-10-CM | POA: Diagnosis not present

## 2019-11-20 DIAGNOSIS — D631 Anemia in chronic kidney disease: Secondary | ICD-10-CM | POA: Diagnosis not present

## 2019-11-20 DIAGNOSIS — J45909 Unspecified asthma, uncomplicated: Secondary | ICD-10-CM | POA: Diagnosis not present

## 2019-11-20 DIAGNOSIS — R0600 Dyspnea, unspecified: Secondary | ICD-10-CM | POA: Diagnosis not present

## 2019-11-20 DIAGNOSIS — Z8781 Personal history of (healed) traumatic fracture: Secondary | ICD-10-CM | POA: Diagnosis not present

## 2019-11-20 DIAGNOSIS — Z79890 Hormone replacement therapy (postmenopausal): Secondary | ICD-10-CM | POA: Diagnosis not present

## 2019-11-20 DIAGNOSIS — I5033 Acute on chronic diastolic (congestive) heart failure: Secondary | ICD-10-CM | POA: Diagnosis not present

## 2019-11-20 DIAGNOSIS — R911 Solitary pulmonary nodule: Secondary | ICD-10-CM | POA: Diagnosis not present

## 2019-11-20 DIAGNOSIS — F419 Anxiety disorder, unspecified: Secondary | ICD-10-CM | POA: Diagnosis not present

## 2019-11-20 DIAGNOSIS — G301 Alzheimer's disease with late onset: Secondary | ICD-10-CM | POA: Diagnosis not present

## 2019-11-20 DIAGNOSIS — M810 Age-related osteoporosis without current pathological fracture: Secondary | ICD-10-CM | POA: Diagnosis not present

## 2019-11-20 DIAGNOSIS — I509 Heart failure, unspecified: Secondary | ICD-10-CM | POA: Diagnosis not present

## 2019-11-20 DIAGNOSIS — E119 Type 2 diabetes mellitus without complications: Secondary | ICD-10-CM | POA: Diagnosis not present

## 2019-11-20 DIAGNOSIS — D509 Iron deficiency anemia, unspecified: Secondary | ICD-10-CM | POA: Diagnosis not present

## 2019-11-20 DIAGNOSIS — K922 Gastrointestinal hemorrhage, unspecified: Secondary | ICD-10-CM | POA: Diagnosis not present

## 2019-11-20 DIAGNOSIS — H748X2 Other specified disorders of left middle ear and mastoid: Secondary | ICD-10-CM | POA: Diagnosis not present

## 2019-11-20 DIAGNOSIS — R188 Other ascites: Secondary | ICD-10-CM | POA: Diagnosis not present

## 2019-11-20 DIAGNOSIS — Z9189 Other specified personal risk factors, not elsewhere classified: Secondary | ICD-10-CM | POA: Diagnosis not present

## 2019-11-20 DIAGNOSIS — R079 Chest pain, unspecified: Secondary | ICD-10-CM | POA: Diagnosis not present

## 2019-11-20 DIAGNOSIS — K529 Noninfective gastroenteritis and colitis, unspecified: Secondary | ICD-10-CM | POA: Diagnosis not present

## 2019-11-20 DIAGNOSIS — I63233 Cerebral infarction due to unspecified occlusion or stenosis of bilateral carotid arteries: Secondary | ICD-10-CM | POA: Diagnosis not present

## 2019-11-20 DIAGNOSIS — Z96641 Presence of right artificial hip joint: Secondary | ICD-10-CM | POA: Diagnosis not present

## 2019-11-20 DIAGNOSIS — Z20822 Contact with and (suspected) exposure to covid-19: Secondary | ICD-10-CM | POA: Diagnosis not present

## 2019-11-20 DIAGNOSIS — G9341 Metabolic encephalopathy: Secondary | ICD-10-CM | POA: Diagnosis not present

## 2019-11-20 DIAGNOSIS — R0902 Hypoxemia: Secondary | ICD-10-CM | POA: Diagnosis not present

## 2019-11-20 DIAGNOSIS — N3001 Acute cystitis with hematuria: Secondary | ICD-10-CM | POA: Diagnosis not present

## 2019-11-21 DIAGNOSIS — R9082 White matter disease, unspecified: Secondary | ICD-10-CM | POA: Diagnosis not present

## 2019-11-21 DIAGNOSIS — R6510 Systemic inflammatory response syndrome (SIRS) of non-infectious origin without acute organ dysfunction: Secondary | ICD-10-CM | POA: Diagnosis not present

## 2019-11-21 DIAGNOSIS — J181 Lobar pneumonia, unspecified organism: Secondary | ICD-10-CM | POA: Diagnosis not present

## 2019-11-21 DIAGNOSIS — I2699 Other pulmonary embolism without acute cor pulmonale: Secondary | ICD-10-CM | POA: Diagnosis not present

## 2019-11-21 DIAGNOSIS — R1310 Dysphagia, unspecified: Secondary | ICD-10-CM | POA: Diagnosis not present

## 2019-11-21 DIAGNOSIS — I63512 Cerebral infarction due to unspecified occlusion or stenosis of left middle cerebral artery: Secondary | ICD-10-CM | POA: Diagnosis not present

## 2019-11-21 DIAGNOSIS — I4891 Unspecified atrial fibrillation: Secondary | ICD-10-CM | POA: Diagnosis not present

## 2019-11-21 DIAGNOSIS — G301 Alzheimer's disease with late onset: Secondary | ICD-10-CM | POA: Diagnosis not present

## 2019-11-21 DIAGNOSIS — E871 Hypo-osmolality and hyponatremia: Secondary | ICD-10-CM | POA: Diagnosis not present

## 2019-11-21 DIAGNOSIS — Z9889 Other specified postprocedural states: Secondary | ICD-10-CM | POA: Diagnosis not present

## 2019-11-21 DIAGNOSIS — K7460 Unspecified cirrhosis of liver: Secondary | ICD-10-CM | POA: Diagnosis not present

## 2019-11-21 DIAGNOSIS — R188 Other ascites: Secondary | ICD-10-CM | POA: Diagnosis not present

## 2019-11-21 DIAGNOSIS — I63233 Cerebral infarction due to unspecified occlusion or stenosis of bilateral carotid arteries: Secondary | ICD-10-CM | POA: Diagnosis not present

## 2019-11-21 DIAGNOSIS — N179 Acute kidney failure, unspecified: Secondary | ICD-10-CM | POA: Diagnosis not present

## 2019-11-21 DIAGNOSIS — A419 Sepsis, unspecified organism: Secondary | ICD-10-CM | POA: Diagnosis not present

## 2019-11-21 DIAGNOSIS — I82403 Acute embolism and thrombosis of unspecified deep veins of lower extremity, bilateral: Secondary | ICD-10-CM | POA: Diagnosis not present

## 2019-11-21 DIAGNOSIS — N3001 Acute cystitis with hematuria: Secondary | ICD-10-CM | POA: Diagnosis not present

## 2019-11-21 DIAGNOSIS — R6520 Severe sepsis without septic shock: Secondary | ICD-10-CM | POA: Diagnosis not present

## 2019-11-21 DIAGNOSIS — E162 Hypoglycemia, unspecified: Secondary | ICD-10-CM | POA: Diagnosis not present

## 2019-11-21 DIAGNOSIS — Z23 Encounter for immunization: Secondary | ICD-10-CM | POA: Diagnosis not present

## 2019-11-21 DIAGNOSIS — E119 Type 2 diabetes mellitus without complications: Secondary | ICD-10-CM | POA: Diagnosis not present

## 2019-11-21 DIAGNOSIS — J45909 Unspecified asthma, uncomplicated: Secondary | ICD-10-CM | POA: Diagnosis not present

## 2019-11-21 DIAGNOSIS — G459 Transient cerebral ischemic attack, unspecified: Secondary | ICD-10-CM | POA: Diagnosis not present

## 2019-11-21 DIAGNOSIS — K56609 Unspecified intestinal obstruction, unspecified as to partial versus complete obstruction: Secondary | ICD-10-CM | POA: Diagnosis not present

## 2019-11-21 DIAGNOSIS — I5033 Acute on chronic diastolic (congestive) heart failure: Secondary | ICD-10-CM | POA: Diagnosis not present

## 2019-11-21 DIAGNOSIS — N1832 Chronic kidney disease, stage 3b: Secondary | ICD-10-CM | POA: Diagnosis not present

## 2019-11-21 DIAGNOSIS — J90 Pleural effusion, not elsewhere classified: Secondary | ICD-10-CM | POA: Diagnosis not present

## 2019-11-21 DIAGNOSIS — D62 Acute posthemorrhagic anemia: Secondary | ICD-10-CM | POA: Diagnosis not present

## 2019-11-21 DIAGNOSIS — K529 Noninfective gastroenteritis and colitis, unspecified: Secondary | ICD-10-CM | POA: Diagnosis not present

## 2019-11-21 DIAGNOSIS — I480 Paroxysmal atrial fibrillation: Secondary | ICD-10-CM | POA: Diagnosis not present

## 2019-11-21 DIAGNOSIS — I6389 Other cerebral infarction: Secondary | ICD-10-CM | POA: Diagnosis not present

## 2019-11-21 DIAGNOSIS — E872 Acidosis: Secondary | ICD-10-CM | POA: Diagnosis not present

## 2019-11-21 DIAGNOSIS — J441 Chronic obstructive pulmonary disease with (acute) exacerbation: Secondary | ICD-10-CM | POA: Diagnosis not present

## 2019-11-21 DIAGNOSIS — Z8619 Personal history of other infectious and parasitic diseases: Secondary | ICD-10-CM | POA: Diagnosis not present

## 2019-11-21 DIAGNOSIS — R0600 Dyspnea, unspecified: Secondary | ICD-10-CM | POA: Diagnosis not present

## 2019-11-21 DIAGNOSIS — R197 Diarrhea, unspecified: Secondary | ICD-10-CM | POA: Diagnosis not present

## 2019-11-21 DIAGNOSIS — I214 Non-ST elevation (NSTEMI) myocardial infarction: Secondary | ICD-10-CM | POA: Diagnosis not present

## 2019-11-21 DIAGNOSIS — F0280 Dementia in other diseases classified elsewhere without behavioral disturbance: Secondary | ICD-10-CM | POA: Diagnosis not present

## 2019-11-21 DIAGNOSIS — E876 Hypokalemia: Secondary | ICD-10-CM | POA: Diagnosis not present

## 2019-11-21 DIAGNOSIS — F419 Anxiety disorder, unspecified: Secondary | ICD-10-CM | POA: Diagnosis not present

## 2019-11-21 DIAGNOSIS — T82855A Stenosis of coronary artery stent, initial encounter: Secondary | ICD-10-CM | POA: Diagnosis not present

## 2019-11-21 DIAGNOSIS — I5043 Acute on chronic combined systolic (congestive) and diastolic (congestive) heart failure: Secondary | ICD-10-CM | POA: Diagnosis not present

## 2019-11-21 DIAGNOSIS — Z20822 Contact with and (suspected) exposure to covid-19: Secondary | ICD-10-CM | POA: Diagnosis not present

## 2019-11-21 DIAGNOSIS — J9621 Acute and chronic respiratory failure with hypoxia: Secondary | ICD-10-CM | POA: Diagnosis not present

## 2019-11-21 DIAGNOSIS — D689 Coagulation defect, unspecified: Secondary | ICD-10-CM | POA: Diagnosis not present

## 2019-11-21 DIAGNOSIS — J189 Pneumonia, unspecified organism: Secondary | ICD-10-CM | POA: Diagnosis not present

## 2019-11-21 DIAGNOSIS — J9811 Atelectasis: Secondary | ICD-10-CM | POA: Diagnosis not present

## 2019-11-21 DIAGNOSIS — R0902 Hypoxemia: Secondary | ICD-10-CM | POA: Diagnosis not present

## 2019-11-21 DIAGNOSIS — H748X2 Other specified disorders of left middle ear and mastoid: Secondary | ICD-10-CM | POA: Diagnosis not present

## 2019-11-21 DIAGNOSIS — N170 Acute kidney failure with tubular necrosis: Secondary | ICD-10-CM | POA: Diagnosis not present

## 2019-11-21 DIAGNOSIS — G3189 Other specified degenerative diseases of nervous system: Secondary | ICD-10-CM | POA: Diagnosis not present

## 2019-11-22 DIAGNOSIS — E113293 Type 2 diabetes mellitus with mild nonproliferative diabetic retinopathy without macular edema, bilateral: Secondary | ICD-10-CM | POA: Diagnosis not present

## 2019-11-22 DIAGNOSIS — N170 Acute kidney failure with tubular necrosis: Secondary | ICD-10-CM | POA: Diagnosis not present

## 2019-11-22 DIAGNOSIS — E039 Hypothyroidism, unspecified: Secondary | ICD-10-CM | POA: Diagnosis not present

## 2019-11-22 DIAGNOSIS — N400 Enlarged prostate without lower urinary tract symptoms: Secondary | ICD-10-CM | POA: Diagnosis not present

## 2019-11-22 DIAGNOSIS — I4811 Longstanding persistent atrial fibrillation: Secondary | ICD-10-CM | POA: Diagnosis not present

## 2019-11-22 DIAGNOSIS — R0902 Hypoxemia: Secondary | ICD-10-CM | POA: Diagnosis not present

## 2019-11-22 DIAGNOSIS — J302 Other seasonal allergic rhinitis: Secondary | ICD-10-CM | POA: Diagnosis not present

## 2019-11-22 DIAGNOSIS — E162 Hypoglycemia, unspecified: Secondary | ICD-10-CM | POA: Diagnosis not present

## 2019-11-22 DIAGNOSIS — R0602 Shortness of breath: Secondary | ICD-10-CM | POA: Diagnosis not present

## 2019-11-22 DIAGNOSIS — E1165 Type 2 diabetes mellitus with hyperglycemia: Secondary | ICD-10-CM | POA: Diagnosis not present

## 2019-11-22 DIAGNOSIS — E871 Hypo-osmolality and hyponatremia: Secondary | ICD-10-CM | POA: Diagnosis not present

## 2019-11-22 DIAGNOSIS — I480 Paroxysmal atrial fibrillation: Secondary | ICD-10-CM | POA: Diagnosis not present

## 2019-11-22 DIAGNOSIS — Z7189 Other specified counseling: Secondary | ICD-10-CM | POA: Diagnosis not present

## 2019-11-22 DIAGNOSIS — E1122 Type 2 diabetes mellitus with diabetic chronic kidney disease: Secondary | ICD-10-CM | POA: Diagnosis not present

## 2019-11-22 DIAGNOSIS — Z1211 Encounter for screening for malignant neoplasm of colon: Secondary | ICD-10-CM | POA: Diagnosis not present

## 2019-11-22 DIAGNOSIS — A419 Sepsis, unspecified organism: Secondary | ICD-10-CM | POA: Diagnosis not present

## 2019-11-22 DIAGNOSIS — Z9189 Other specified personal risk factors, not elsewhere classified: Secondary | ICD-10-CM | POA: Diagnosis not present

## 2019-11-22 DIAGNOSIS — Z20822 Contact with and (suspected) exposure to covid-19: Secondary | ICD-10-CM | POA: Diagnosis not present

## 2019-11-22 DIAGNOSIS — E669 Obesity, unspecified: Secondary | ICD-10-CM | POA: Diagnosis not present

## 2019-11-22 DIAGNOSIS — I4891 Unspecified atrial fibrillation: Secondary | ICD-10-CM | POA: Diagnosis not present

## 2019-11-22 DIAGNOSIS — Z9889 Other specified postprocedural states: Secondary | ICD-10-CM | POA: Diagnosis not present

## 2019-11-22 DIAGNOSIS — I959 Hypotension, unspecified: Secondary | ICD-10-CM | POA: Diagnosis not present

## 2019-11-22 DIAGNOSIS — M1990 Unspecified osteoarthritis, unspecified site: Secondary | ICD-10-CM | POA: Diagnosis not present

## 2019-11-22 DIAGNOSIS — K7460 Unspecified cirrhosis of liver: Secondary | ICD-10-CM | POA: Diagnosis not present

## 2019-11-22 DIAGNOSIS — K529 Noninfective gastroenteritis and colitis, unspecified: Secondary | ICD-10-CM | POA: Diagnosis not present

## 2019-11-22 DIAGNOSIS — C4442 Squamous cell carcinoma of skin of scalp and neck: Secondary | ICD-10-CM | POA: Diagnosis not present

## 2019-11-22 DIAGNOSIS — J9601 Acute respiratory failure with hypoxia: Secondary | ICD-10-CM | POA: Diagnosis not present

## 2019-11-22 DIAGNOSIS — S61412D Laceration without foreign body of left hand, subsequent encounter: Secondary | ICD-10-CM | POA: Diagnosis not present

## 2019-11-22 DIAGNOSIS — M6281 Muscle weakness (generalized): Secondary | ICD-10-CM | POA: Diagnosis not present

## 2019-11-22 DIAGNOSIS — E785 Hyperlipidemia, unspecified: Secondary | ICD-10-CM | POA: Diagnosis not present

## 2019-11-22 DIAGNOSIS — J90 Pleural effusion, not elsewhere classified: Secondary | ICD-10-CM | POA: Diagnosis not present

## 2019-11-22 DIAGNOSIS — D509 Iron deficiency anemia, unspecified: Secondary | ICD-10-CM | POA: Diagnosis not present

## 2019-11-22 DIAGNOSIS — M5137 Other intervertebral disc degeneration, lumbosacral region: Secondary | ICD-10-CM | POA: Diagnosis not present

## 2019-11-22 DIAGNOSIS — Z1159 Encounter for screening for other viral diseases: Secondary | ICD-10-CM | POA: Diagnosis not present

## 2019-11-22 DIAGNOSIS — N183 Chronic kidney disease, stage 3 unspecified: Secondary | ICD-10-CM | POA: Diagnosis not present

## 2019-11-22 DIAGNOSIS — I70245 Atherosclerosis of native arteries of left leg with ulceration of other part of foot: Secondary | ICD-10-CM | POA: Diagnosis not present

## 2019-11-22 DIAGNOSIS — N189 Chronic kidney disease, unspecified: Secondary | ICD-10-CM | POA: Diagnosis not present

## 2019-11-22 DIAGNOSIS — E872 Acidosis: Secondary | ICD-10-CM | POA: Diagnosis not present

## 2019-11-22 DIAGNOSIS — D46A Refractory cytopenia with multilineage dysplasia: Secondary | ICD-10-CM | POA: Diagnosis not present

## 2019-11-22 DIAGNOSIS — S91101S Unspecified open wound of right great toe without damage to nail, sequela: Secondary | ICD-10-CM | POA: Diagnosis not present

## 2019-11-22 DIAGNOSIS — H04129 Dry eye syndrome of unspecified lacrimal gland: Secondary | ICD-10-CM | POA: Diagnosis not present

## 2019-11-22 DIAGNOSIS — U071 COVID-19: Secondary | ICD-10-CM | POA: Diagnosis not present

## 2019-11-22 DIAGNOSIS — M4316 Spondylolisthesis, lumbar region: Secondary | ICD-10-CM | POA: Diagnosis not present

## 2019-11-22 DIAGNOSIS — N390 Urinary tract infection, site not specified: Secondary | ICD-10-CM | POA: Diagnosis not present

## 2019-11-22 DIAGNOSIS — I313 Pericardial effusion (noninflammatory): Secondary | ICD-10-CM | POA: Diagnosis not present

## 2019-11-22 DIAGNOSIS — R410 Disorientation, unspecified: Secondary | ICD-10-CM | POA: Diagnosis not present

## 2019-11-22 DIAGNOSIS — H43811 Vitreous degeneration, right eye: Secondary | ICD-10-CM | POA: Diagnosis not present

## 2019-11-22 DIAGNOSIS — R52 Pain, unspecified: Secondary | ICD-10-CM | POA: Diagnosis not present

## 2019-11-22 DIAGNOSIS — J181 Lobar pneumonia, unspecified organism: Secondary | ICD-10-CM | POA: Diagnosis not present

## 2019-11-22 DIAGNOSIS — R1310 Dysphagia, unspecified: Secondary | ICD-10-CM | POA: Diagnosis not present

## 2019-11-22 DIAGNOSIS — M2550 Pain in unspecified joint: Secondary | ICD-10-CM | POA: Diagnosis not present

## 2019-11-22 DIAGNOSIS — R300 Dysuria: Secondary | ICD-10-CM | POA: Diagnosis not present

## 2019-11-22 DIAGNOSIS — N2889 Other specified disorders of kidney and ureter: Secondary | ICD-10-CM | POA: Diagnosis not present

## 2019-11-22 DIAGNOSIS — M5136 Other intervertebral disc degeneration, lumbar region: Secondary | ICD-10-CM | POA: Diagnosis not present

## 2019-11-22 DIAGNOSIS — I739 Peripheral vascular disease, unspecified: Secondary | ICD-10-CM | POA: Diagnosis not present

## 2019-11-22 DIAGNOSIS — I2510 Atherosclerotic heart disease of native coronary artery without angina pectoris: Secondary | ICD-10-CM | POA: Diagnosis not present

## 2019-11-22 DIAGNOSIS — F419 Anxiety disorder, unspecified: Secondary | ICD-10-CM | POA: Diagnosis not present

## 2019-11-22 DIAGNOSIS — K589 Irritable bowel syndrome without diarrhea: Secondary | ICD-10-CM | POA: Diagnosis not present

## 2019-11-22 DIAGNOSIS — R188 Other ascites: Secondary | ICD-10-CM | POA: Diagnosis not present

## 2019-11-22 DIAGNOSIS — Z515 Encounter for palliative care: Secondary | ICD-10-CM | POA: Diagnosis not present

## 2019-11-22 DIAGNOSIS — M9904 Segmental and somatic dysfunction of sacral region: Secondary | ICD-10-CM | POA: Diagnosis not present

## 2019-11-22 DIAGNOSIS — E6609 Other obesity due to excess calories: Secondary | ICD-10-CM | POA: Diagnosis not present

## 2019-11-22 DIAGNOSIS — K7011 Alcoholic hepatitis with ascites: Secondary | ICD-10-CM | POA: Diagnosis not present

## 2019-11-22 DIAGNOSIS — M9905 Segmental and somatic dysfunction of pelvic region: Secondary | ICD-10-CM | POA: Diagnosis not present

## 2019-11-22 DIAGNOSIS — Z79899 Other long term (current) drug therapy: Secondary | ICD-10-CM | POA: Diagnosis not present

## 2019-11-22 DIAGNOSIS — Z7401 Bed confinement status: Secondary | ICD-10-CM | POA: Diagnosis not present

## 2019-11-22 DIAGNOSIS — M9903 Segmental and somatic dysfunction of lumbar region: Secondary | ICD-10-CM | POA: Diagnosis not present

## 2019-11-22 DIAGNOSIS — I10 Essential (primary) hypertension: Secondary | ICD-10-CM | POA: Diagnosis not present

## 2019-11-22 DIAGNOSIS — N1832 Chronic kidney disease, stage 3b: Secondary | ICD-10-CM | POA: Diagnosis not present

## 2019-11-22 DIAGNOSIS — I69351 Hemiplegia and hemiparesis following cerebral infarction affecting right dominant side: Secondary | ICD-10-CM | POA: Diagnosis not present

## 2019-11-22 DIAGNOSIS — Z4931 Encounter for adequacy testing for hemodialysis: Secondary | ICD-10-CM | POA: Diagnosis not present

## 2019-11-22 DIAGNOSIS — E7800 Pure hypercholesterolemia, unspecified: Secondary | ICD-10-CM | POA: Diagnosis not present

## 2019-11-22 DIAGNOSIS — D649 Anemia, unspecified: Secondary | ICD-10-CM | POA: Diagnosis not present

## 2019-11-22 DIAGNOSIS — R6510 Systemic inflammatory response syndrome (SIRS) of non-infectious origin without acute organ dysfunction: Secondary | ICD-10-CM | POA: Diagnosis not present

## 2019-11-22 DIAGNOSIS — I2699 Other pulmonary embolism without acute cor pulmonale: Secondary | ICD-10-CM | POA: Diagnosis not present

## 2019-11-22 DIAGNOSIS — E1151 Type 2 diabetes mellitus with diabetic peripheral angiopathy without gangrene: Secondary | ICD-10-CM | POA: Diagnosis not present

## 2019-11-22 DIAGNOSIS — N179 Acute kidney failure, unspecified: Secondary | ICD-10-CM | POA: Diagnosis not present

## 2019-11-22 DIAGNOSIS — R6520 Severe sepsis without septic shock: Secondary | ICD-10-CM | POA: Diagnosis not present

## 2019-11-22 DIAGNOSIS — M62551 Muscle wasting and atrophy, not elsewhere classified, right thigh: Secondary | ICD-10-CM | POA: Diagnosis not present

## 2019-11-22 DIAGNOSIS — D689 Coagulation defect, unspecified: Secondary | ICD-10-CM | POA: Diagnosis not present

## 2019-11-22 DIAGNOSIS — I129 Hypertensive chronic kidney disease with stage 1 through stage 4 chronic kidney disease, or unspecified chronic kidney disease: Secondary | ICD-10-CM | POA: Diagnosis not present

## 2019-11-22 DIAGNOSIS — M792 Neuralgia and neuritis, unspecified: Secondary | ICD-10-CM | POA: Diagnosis not present

## 2019-11-22 DIAGNOSIS — D500 Iron deficiency anemia secondary to blood loss (chronic): Secondary | ICD-10-CM | POA: Diagnosis not present

## 2019-11-22 DIAGNOSIS — R069 Unspecified abnormalities of breathing: Secondary | ICD-10-CM | POA: Diagnosis not present

## 2019-11-22 DIAGNOSIS — I25119 Atherosclerotic heart disease of native coronary artery with unspecified angina pectoris: Secondary | ICD-10-CM | POA: Diagnosis not present

## 2019-11-22 DIAGNOSIS — S0181XD Laceration without foreign body of other part of head, subsequent encounter: Secondary | ICD-10-CM | POA: Diagnosis not present

## 2019-11-22 DIAGNOSIS — Z2089 Contact with and (suspected) exposure to other communicable diseases: Secondary | ICD-10-CM | POA: Diagnosis not present

## 2019-11-22 DIAGNOSIS — Z8673 Personal history of transient ischemic attack (TIA), and cerebral infarction without residual deficits: Secondary | ICD-10-CM | POA: Diagnosis not present

## 2019-11-22 DIAGNOSIS — Z86711 Personal history of pulmonary embolism: Secondary | ICD-10-CM | POA: Diagnosis not present

## 2019-11-22 DIAGNOSIS — M79605 Pain in left leg: Secondary | ICD-10-CM | POA: Diagnosis not present

## 2019-11-22 DIAGNOSIS — E876 Hypokalemia: Secondary | ICD-10-CM | POA: Diagnosis not present

## 2019-11-22 DIAGNOSIS — F1020 Alcohol dependence, uncomplicated: Secondary | ICD-10-CM | POA: Diagnosis not present

## 2019-11-22 DIAGNOSIS — Z6833 Body mass index (BMI) 33.0-33.9, adult: Secondary | ICD-10-CM | POA: Diagnosis not present

## 2019-11-22 DIAGNOSIS — M62830 Muscle spasm of back: Secondary | ICD-10-CM | POA: Diagnosis not present

## 2019-11-22 DIAGNOSIS — R0600 Dyspnea, unspecified: Secondary | ICD-10-CM | POA: Diagnosis not present

## 2019-11-22 DIAGNOSIS — I440 Atrioventricular block, first degree: Secondary | ICD-10-CM | POA: Diagnosis not present

## 2019-11-22 DIAGNOSIS — G43001 Migraine without aura, not intractable, with status migrainosus: Secondary | ICD-10-CM | POA: Diagnosis not present

## 2019-11-22 DIAGNOSIS — D631 Anemia in chronic kidney disease: Secondary | ICD-10-CM | POA: Diagnosis not present

## 2019-11-22 DIAGNOSIS — F0280 Dementia in other diseases classified elsewhere without behavioral disturbance: Secondary | ICD-10-CM | POA: Diagnosis not present

## 2019-11-22 DIAGNOSIS — I201 Angina pectoris with documented spasm: Secondary | ICD-10-CM | POA: Diagnosis not present

## 2019-11-22 DIAGNOSIS — J1282 Pneumonia due to coronavirus disease 2019: Secondary | ICD-10-CM | POA: Diagnosis not present

## 2019-11-22 DIAGNOSIS — J441 Chronic obstructive pulmonary disease with (acute) exacerbation: Secondary | ICD-10-CM | POA: Diagnosis not present

## 2019-11-22 DIAGNOSIS — I700 Atherosclerosis of aorta: Secondary | ICD-10-CM | POA: Diagnosis not present

## 2019-11-22 DIAGNOSIS — K2980 Duodenitis without bleeding: Secondary | ICD-10-CM | POA: Diagnosis not present

## 2019-11-22 DIAGNOSIS — M47816 Spondylosis without myelopathy or radiculopathy, lumbar region: Secondary | ICD-10-CM | POA: Diagnosis not present

## 2019-11-22 DIAGNOSIS — J189 Pneumonia, unspecified organism: Secondary | ICD-10-CM | POA: Diagnosis not present

## 2019-11-22 DIAGNOSIS — R197 Diarrhea, unspecified: Secondary | ICD-10-CM | POA: Diagnosis not present

## 2019-11-22 DIAGNOSIS — Z87891 Personal history of nicotine dependence: Secondary | ICD-10-CM | POA: Diagnosis not present

## 2019-11-22 DIAGNOSIS — R634 Abnormal weight loss: Secondary | ICD-10-CM | POA: Diagnosis not present

## 2019-11-22 DIAGNOSIS — W19XXXD Unspecified fall, subsequent encounter: Secondary | ICD-10-CM | POA: Diagnosis not present

## 2019-11-22 DIAGNOSIS — G301 Alzheimer's disease with late onset: Secondary | ICD-10-CM | POA: Diagnosis not present

## 2019-11-22 DIAGNOSIS — N2581 Secondary hyperparathyroidism of renal origin: Secondary | ICD-10-CM | POA: Diagnosis not present

## 2019-11-22 DIAGNOSIS — R2681 Unsteadiness on feet: Secondary | ICD-10-CM | POA: Diagnosis not present

## 2019-11-22 DIAGNOSIS — H40023 Open angle with borderline findings, high risk, bilateral: Secondary | ICD-10-CM | POA: Diagnosis not present

## 2019-11-22 DIAGNOSIS — K3189 Other diseases of stomach and duodenum: Secondary | ICD-10-CM | POA: Diagnosis not present

## 2019-11-22 DIAGNOSIS — F17210 Nicotine dependence, cigarettes, uncomplicated: Secondary | ICD-10-CM | POA: Diagnosis not present

## 2019-11-22 DIAGNOSIS — F3130 Bipolar disorder, current episode depressed, mild or moderate severity, unspecified: Secondary | ICD-10-CM | POA: Diagnosis not present

## 2019-11-22 DIAGNOSIS — D696 Thrombocytopenia, unspecified: Secondary | ICD-10-CM | POA: Diagnosis not present

## 2019-11-22 DIAGNOSIS — I82403 Acute embolism and thrombosis of unspecified deep veins of lower extremity, bilateral: Secondary | ICD-10-CM | POA: Diagnosis not present

## 2019-11-22 DIAGNOSIS — F29 Unspecified psychosis not due to a substance or known physiological condition: Secondary | ICD-10-CM | POA: Diagnosis not present

## 2019-11-22 DIAGNOSIS — M25572 Pain in left ankle and joints of left foot: Secondary | ICD-10-CM | POA: Diagnosis not present

## 2019-11-22 DIAGNOSIS — Z5111 Encounter for antineoplastic chemotherapy: Secondary | ICD-10-CM | POA: Diagnosis not present

## 2019-11-22 DIAGNOSIS — L739 Follicular disorder, unspecified: Secondary | ICD-10-CM | POA: Diagnosis not present

## 2019-11-22 DIAGNOSIS — N3001 Acute cystitis with hematuria: Secondary | ICD-10-CM | POA: Diagnosis not present

## 2019-11-22 DIAGNOSIS — Z89612 Acquired absence of left leg above knee: Secondary | ICD-10-CM | POA: Diagnosis not present

## 2019-11-22 DIAGNOSIS — R05 Cough: Secondary | ICD-10-CM | POA: Diagnosis not present

## 2019-11-22 DIAGNOSIS — Z7982 Long term (current) use of aspirin: Secondary | ICD-10-CM | POA: Diagnosis not present

## 2019-11-22 DIAGNOSIS — R41841 Cognitive communication deficit: Secondary | ICD-10-CM | POA: Diagnosis not present

## 2019-11-22 DIAGNOSIS — K766 Portal hypertension: Secondary | ICD-10-CM | POA: Diagnosis not present

## 2019-11-22 DIAGNOSIS — M62552 Muscle wasting and atrophy, not elsewhere classified, left thigh: Secondary | ICD-10-CM | POA: Diagnosis not present

## 2019-11-22 DIAGNOSIS — G629 Polyneuropathy, unspecified: Secondary | ICD-10-CM | POA: Diagnosis not present

## 2019-11-22 DIAGNOSIS — F319 Bipolar disorder, unspecified: Secondary | ICD-10-CM | POA: Diagnosis not present

## 2019-11-22 DIAGNOSIS — Z992 Dependence on renal dialysis: Secondary | ICD-10-CM | POA: Diagnosis not present

## 2019-11-22 DIAGNOSIS — I998 Other disorder of circulatory system: Secondary | ICD-10-CM | POA: Diagnosis not present

## 2019-11-22 DIAGNOSIS — Z6825 Body mass index (BMI) 25.0-25.9, adult: Secondary | ICD-10-CM | POA: Diagnosis not present

## 2019-11-22 DIAGNOSIS — Z85828 Personal history of other malignant neoplasm of skin: Secondary | ICD-10-CM | POA: Diagnosis not present

## 2019-11-22 DIAGNOSIS — R82998 Other abnormal findings in urine: Secondary | ICD-10-CM | POA: Diagnosis not present

## 2019-11-22 DIAGNOSIS — F1010 Alcohol abuse, uncomplicated: Secondary | ICD-10-CM | POA: Diagnosis not present

## 2019-11-22 DIAGNOSIS — E119 Type 2 diabetes mellitus without complications: Secondary | ICD-10-CM | POA: Diagnosis not present

## 2019-11-22 DIAGNOSIS — F329 Major depressive disorder, single episode, unspecified: Secondary | ICD-10-CM | POA: Diagnosis not present

## 2019-11-22 DIAGNOSIS — R404 Transient alteration of awareness: Secondary | ICD-10-CM | POA: Diagnosis not present

## 2019-11-22 DIAGNOSIS — I5033 Acute on chronic diastolic (congestive) heart failure: Secondary | ICD-10-CM | POA: Diagnosis not present

## 2019-11-22 DIAGNOSIS — Z20828 Contact with and (suspected) exposure to other viral communicable diseases: Secondary | ICD-10-CM | POA: Diagnosis not present

## 2019-11-22 DIAGNOSIS — I70248 Atherosclerosis of native arteries of left leg with ulceration of other part of lower left leg: Secondary | ICD-10-CM | POA: Diagnosis not present

## 2019-11-22 DIAGNOSIS — K219 Gastro-esophageal reflux disease without esophagitis: Secondary | ICD-10-CM | POA: Diagnosis not present

## 2019-11-22 DIAGNOSIS — J45909 Unspecified asthma, uncomplicated: Secondary | ICD-10-CM | POA: Diagnosis not present

## 2019-11-22 DIAGNOSIS — L821 Other seborrheic keratosis: Secondary | ICD-10-CM | POA: Diagnosis not present

## 2019-11-22 DIAGNOSIS — N186 End stage renal disease: Secondary | ICD-10-CM | POA: Diagnosis not present

## 2019-11-22 DIAGNOSIS — L820 Inflamed seborrheic keratosis: Secondary | ICD-10-CM | POA: Diagnosis not present

## 2019-11-23 DIAGNOSIS — N179 Acute kidney failure, unspecified: Secondary | ICD-10-CM | POA: Diagnosis not present

## 2019-11-23 DIAGNOSIS — G301 Alzheimer's disease with late onset: Secondary | ICD-10-CM | POA: Diagnosis not present

## 2019-11-23 DIAGNOSIS — R5383 Other fatigue: Secondary | ICD-10-CM | POA: Diagnosis not present

## 2019-11-23 DIAGNOSIS — R2681 Unsteadiness on feet: Secondary | ICD-10-CM | POA: Diagnosis not present

## 2019-11-23 DIAGNOSIS — M6281 Muscle weakness (generalized): Secondary | ICD-10-CM | POA: Diagnosis not present

## 2019-11-23 DIAGNOSIS — R6520 Severe sepsis without septic shock: Secondary | ICD-10-CM | POA: Diagnosis not present

## 2019-11-23 DIAGNOSIS — Z48813 Encounter for surgical aftercare following surgery on the respiratory system: Secondary | ICD-10-CM | POA: Diagnosis not present

## 2019-11-23 DIAGNOSIS — K7460 Unspecified cirrhosis of liver: Secondary | ICD-10-CM | POA: Diagnosis not present

## 2019-11-23 DIAGNOSIS — E870 Hyperosmolality and hypernatremia: Secondary | ICD-10-CM | POA: Diagnosis not present

## 2019-11-23 DIAGNOSIS — I87322 Chronic venous hypertension (idiopathic) with inflammation of left lower extremity: Secondary | ICD-10-CM | POA: Diagnosis not present

## 2019-11-23 DIAGNOSIS — K219 Gastro-esophageal reflux disease without esophagitis: Secondary | ICD-10-CM | POA: Diagnosis not present

## 2019-11-23 DIAGNOSIS — I2699 Other pulmonary embolism without acute cor pulmonale: Secondary | ICD-10-CM | POA: Diagnosis not present

## 2019-11-23 DIAGNOSIS — M654 Radial styloid tenosynovitis [de Quervain]: Secondary | ICD-10-CM | POA: Diagnosis not present

## 2019-11-23 DIAGNOSIS — Z20822 Contact with and (suspected) exposure to covid-19: Secondary | ICD-10-CM | POA: Diagnosis not present

## 2019-11-23 DIAGNOSIS — C3492 Malignant neoplasm of unspecified part of left bronchus or lung: Secondary | ICD-10-CM | POA: Diagnosis not present

## 2019-11-23 DIAGNOSIS — T83192A Other mechanical complication of urinary stent, initial encounter: Secondary | ICD-10-CM | POA: Diagnosis not present

## 2019-11-23 DIAGNOSIS — J45909 Unspecified asthma, uncomplicated: Secondary | ICD-10-CM | POA: Diagnosis not present

## 2019-11-23 DIAGNOSIS — I87323 Chronic venous hypertension (idiopathic) with inflammation of bilateral lower extremity: Secondary | ICD-10-CM | POA: Diagnosis not present

## 2019-11-23 DIAGNOSIS — I480 Paroxysmal atrial fibrillation: Secondary | ICD-10-CM | POA: Diagnosis not present

## 2019-11-23 DIAGNOSIS — J189 Pneumonia, unspecified organism: Secondary | ICD-10-CM | POA: Diagnosis not present

## 2019-11-23 DIAGNOSIS — E559 Vitamin D deficiency, unspecified: Secondary | ICD-10-CM | POA: Diagnosis not present

## 2019-11-23 DIAGNOSIS — I129 Hypertensive chronic kidney disease with stage 1 through stage 4 chronic kidney disease, or unspecified chronic kidney disease: Secondary | ICD-10-CM | POA: Diagnosis not present

## 2019-11-23 DIAGNOSIS — Z7689 Persons encountering health services in other specified circumstances: Secondary | ICD-10-CM | POA: Diagnosis not present

## 2019-11-23 DIAGNOSIS — Z86010 Personal history of colonic polyps: Secondary | ICD-10-CM | POA: Diagnosis not present

## 2019-11-23 DIAGNOSIS — G20 Parkinson's disease: Secondary | ICD-10-CM | POA: Diagnosis not present

## 2019-11-23 DIAGNOSIS — R3915 Urgency of urination: Secondary | ICD-10-CM | POA: Diagnosis not present

## 2019-11-23 DIAGNOSIS — I2510 Atherosclerotic heart disease of native coronary artery without angina pectoris: Secondary | ICD-10-CM | POA: Diagnosis not present

## 2019-11-23 DIAGNOSIS — D649 Anemia, unspecified: Secondary | ICD-10-CM | POA: Diagnosis not present

## 2019-11-23 DIAGNOSIS — R809 Proteinuria, unspecified: Secondary | ICD-10-CM | POA: Diagnosis not present

## 2019-11-23 DIAGNOSIS — M064 Inflammatory polyarthropathy: Secondary | ICD-10-CM | POA: Diagnosis not present

## 2019-11-23 DIAGNOSIS — K529 Noninfective gastroenteritis and colitis, unspecified: Secondary | ICD-10-CM | POA: Diagnosis not present

## 2019-11-23 DIAGNOSIS — Z9889 Other specified postprocedural states: Secondary | ICD-10-CM | POA: Diagnosis not present

## 2019-11-23 DIAGNOSIS — J9601 Acute respiratory failure with hypoxia: Secondary | ICD-10-CM | POA: Diagnosis not present

## 2019-11-23 DIAGNOSIS — R42 Dizziness and giddiness: Secondary | ICD-10-CM | POA: Diagnosis not present

## 2019-11-23 DIAGNOSIS — R82998 Other abnormal findings in urine: Secondary | ICD-10-CM | POA: Diagnosis not present

## 2019-11-23 DIAGNOSIS — F0280 Dementia in other diseases classified elsewhere without behavioral disturbance: Secondary | ICD-10-CM | POA: Diagnosis not present

## 2019-11-23 DIAGNOSIS — C61 Malignant neoplasm of prostate: Secondary | ICD-10-CM | POA: Diagnosis not present

## 2019-11-23 DIAGNOSIS — R2689 Other abnormalities of gait and mobility: Secondary | ICD-10-CM | POA: Diagnosis not present

## 2019-11-23 DIAGNOSIS — I872 Venous insufficiency (chronic) (peripheral): Secondary | ICD-10-CM | POA: Diagnosis not present

## 2019-11-23 DIAGNOSIS — R6510 Systemic inflammatory response syndrome (SIRS) of non-infectious origin without acute organ dysfunction: Secondary | ICD-10-CM | POA: Diagnosis not present

## 2019-11-23 DIAGNOSIS — J449 Chronic obstructive pulmonary disease, unspecified: Secondary | ICD-10-CM | POA: Diagnosis not present

## 2019-11-23 DIAGNOSIS — R188 Other ascites: Secondary | ICD-10-CM | POA: Diagnosis not present

## 2019-11-23 DIAGNOSIS — Z20828 Contact with and (suspected) exposure to other viral communicable diseases: Secondary | ICD-10-CM | POA: Diagnosis not present

## 2019-11-23 DIAGNOSIS — J1289 Other viral pneumonia: Secondary | ICD-10-CM | POA: Diagnosis not present

## 2019-11-23 DIAGNOSIS — I2583 Coronary atherosclerosis due to lipid rich plaque: Secondary | ICD-10-CM | POA: Diagnosis not present

## 2019-11-23 DIAGNOSIS — D473 Essential (hemorrhagic) thrombocythemia: Secondary | ICD-10-CM | POA: Diagnosis not present

## 2019-11-23 DIAGNOSIS — Z7389 Other problems related to life management difficulty: Secondary | ICD-10-CM | POA: Diagnosis not present

## 2019-11-23 DIAGNOSIS — J181 Lobar pneumonia, unspecified organism: Secondary | ICD-10-CM | POA: Diagnosis not present

## 2019-11-23 DIAGNOSIS — E1142 Type 2 diabetes mellitus with diabetic polyneuropathy: Secondary | ICD-10-CM | POA: Diagnosis not present

## 2019-11-23 DIAGNOSIS — E876 Hypokalemia: Secondary | ICD-10-CM | POA: Diagnosis not present

## 2019-11-23 DIAGNOSIS — R630 Anorexia: Secondary | ICD-10-CM | POA: Diagnosis not present

## 2019-11-23 DIAGNOSIS — Z7189 Other specified counseling: Secondary | ICD-10-CM | POA: Diagnosis not present

## 2019-11-23 DIAGNOSIS — M5417 Radiculopathy, lumbosacral region: Secondary | ICD-10-CM | POA: Diagnosis not present

## 2019-11-23 DIAGNOSIS — N170 Acute kidney failure with tubular necrosis: Secondary | ICD-10-CM | POA: Diagnosis not present

## 2019-11-23 DIAGNOSIS — M9903 Segmental and somatic dysfunction of lumbar region: Secondary | ICD-10-CM | POA: Diagnosis not present

## 2019-11-23 DIAGNOSIS — G9341 Metabolic encephalopathy: Secondary | ICD-10-CM | POA: Diagnosis not present

## 2019-11-23 DIAGNOSIS — F332 Major depressive disorder, recurrent severe without psychotic features: Secondary | ICD-10-CM | POA: Diagnosis not present

## 2019-11-23 DIAGNOSIS — R0600 Dyspnea, unspecified: Secondary | ICD-10-CM | POA: Diagnosis not present

## 2019-11-23 DIAGNOSIS — I422 Other hypertrophic cardiomyopathy: Secondary | ICD-10-CM | POA: Diagnosis not present

## 2019-11-23 DIAGNOSIS — R002 Palpitations: Secondary | ICD-10-CM | POA: Diagnosis not present

## 2019-11-23 DIAGNOSIS — Z7901 Long term (current) use of anticoagulants: Secondary | ICD-10-CM | POA: Diagnosis not present

## 2019-11-23 DIAGNOSIS — H9311 Tinnitus, right ear: Secondary | ICD-10-CM | POA: Diagnosis not present

## 2019-11-23 DIAGNOSIS — A419 Sepsis, unspecified organism: Secondary | ICD-10-CM | POA: Diagnosis not present

## 2019-11-23 DIAGNOSIS — R972 Elevated prostate specific antigen [PSA]: Secondary | ICD-10-CM | POA: Diagnosis not present

## 2019-11-23 DIAGNOSIS — R4701 Aphasia: Secondary | ICD-10-CM | POA: Diagnosis not present

## 2019-11-23 DIAGNOSIS — N202 Calculus of kidney with calculus of ureter: Secondary | ICD-10-CM | POA: Diagnosis not present

## 2019-11-23 DIAGNOSIS — Z515 Encounter for palliative care: Secondary | ICD-10-CM | POA: Diagnosis not present

## 2019-11-23 DIAGNOSIS — I10 Essential (primary) hypertension: Secondary | ICD-10-CM | POA: Diagnosis not present

## 2019-11-23 DIAGNOSIS — J90 Pleural effusion, not elsewhere classified: Secondary | ICD-10-CM | POA: Diagnosis not present

## 2019-11-23 DIAGNOSIS — R079 Chest pain, unspecified: Secondary | ICD-10-CM | POA: Diagnosis not present

## 2019-11-23 DIAGNOSIS — N1832 Chronic kidney disease, stage 3b: Secondary | ICD-10-CM | POA: Diagnosis not present

## 2019-11-23 DIAGNOSIS — E1110 Type 2 diabetes mellitus with ketoacidosis without coma: Secondary | ICD-10-CM | POA: Diagnosis not present

## 2019-11-23 DIAGNOSIS — I82403 Acute embolism and thrombosis of unspecified deep veins of lower extremity, bilateral: Secondary | ICD-10-CM | POA: Diagnosis not present

## 2019-11-23 DIAGNOSIS — E871 Hypo-osmolality and hyponatremia: Secondary | ICD-10-CM | POA: Diagnosis not present

## 2019-11-23 DIAGNOSIS — M12811 Other specific arthropathies, not elsewhere classified, right shoulder: Secondary | ICD-10-CM | POA: Diagnosis not present

## 2019-11-23 DIAGNOSIS — F419 Anxiety disorder, unspecified: Secondary | ICD-10-CM | POA: Diagnosis not present

## 2019-11-23 DIAGNOSIS — R1312 Dysphagia, oropharyngeal phase: Secondary | ICD-10-CM | POA: Diagnosis not present

## 2019-11-23 DIAGNOSIS — M25512 Pain in left shoulder: Secondary | ICD-10-CM | POA: Diagnosis not present

## 2019-11-23 DIAGNOSIS — N1831 Chronic kidney disease, stage 3a: Secondary | ICD-10-CM | POA: Diagnosis not present

## 2019-11-23 DIAGNOSIS — J329 Chronic sinusitis, unspecified: Secondary | ICD-10-CM | POA: Diagnosis not present

## 2019-11-23 DIAGNOSIS — E1169 Type 2 diabetes mellitus with other specified complication: Secondary | ICD-10-CM | POA: Diagnosis not present

## 2019-11-23 DIAGNOSIS — Z87891 Personal history of nicotine dependence: Secondary | ICD-10-CM | POA: Diagnosis not present

## 2019-11-23 DIAGNOSIS — N3001 Acute cystitis with hematuria: Secondary | ICD-10-CM | POA: Diagnosis not present

## 2019-11-23 DIAGNOSIS — D631 Anemia in chronic kidney disease: Secondary | ICD-10-CM | POA: Diagnosis not present

## 2019-11-23 DIAGNOSIS — I87321 Chronic venous hypertension (idiopathic) with inflammation of right lower extremity: Secondary | ICD-10-CM | POA: Diagnosis not present

## 2019-11-23 DIAGNOSIS — E538 Deficiency of other specified B group vitamins: Secondary | ICD-10-CM | POA: Diagnosis not present

## 2019-11-23 DIAGNOSIS — R519 Headache, unspecified: Secondary | ICD-10-CM | POA: Diagnosis not present

## 2019-11-23 DIAGNOSIS — N189 Chronic kidney disease, unspecified: Secondary | ICD-10-CM | POA: Diagnosis not present

## 2019-11-23 DIAGNOSIS — E872 Acidosis: Secondary | ICD-10-CM | POA: Diagnosis not present

## 2019-11-23 DIAGNOSIS — Z96611 Presence of right artificial shoulder joint: Secondary | ICD-10-CM | POA: Diagnosis not present

## 2019-11-23 DIAGNOSIS — U071 COVID-19: Secondary | ICD-10-CM | POA: Diagnosis not present

## 2019-11-23 DIAGNOSIS — E162 Hypoglycemia, unspecified: Secondary | ICD-10-CM | POA: Diagnosis not present

## 2019-11-23 DIAGNOSIS — R05 Cough: Secondary | ICD-10-CM | POA: Diagnosis not present

## 2019-11-23 DIAGNOSIS — Z79899 Other long term (current) drug therapy: Secondary | ICD-10-CM | POA: Diagnosis not present

## 2019-11-23 DIAGNOSIS — M159 Polyosteoarthritis, unspecified: Secondary | ICD-10-CM | POA: Diagnosis not present

## 2019-11-23 DIAGNOSIS — Z87442 Personal history of urinary calculi: Secondary | ICD-10-CM | POA: Diagnosis not present

## 2019-11-23 DIAGNOSIS — Z471 Aftercare following joint replacement surgery: Secondary | ICD-10-CM | POA: Diagnosis not present

## 2019-11-23 DIAGNOSIS — M545 Low back pain: Secondary | ICD-10-CM | POA: Diagnosis not present

## 2019-11-23 DIAGNOSIS — M9904 Segmental and somatic dysfunction of sacral region: Secondary | ICD-10-CM | POA: Diagnosis not present

## 2019-11-23 DIAGNOSIS — R1314 Dysphagia, pharyngoesophageal phase: Secondary | ICD-10-CM | POA: Diagnosis not present

## 2019-11-23 DIAGNOSIS — H40013 Open angle with borderline findings, low risk, bilateral: Secondary | ICD-10-CM | POA: Diagnosis not present

## 2019-11-23 DIAGNOSIS — I110 Hypertensive heart disease with heart failure: Secondary | ICD-10-CM | POA: Diagnosis not present

## 2019-11-23 DIAGNOSIS — E119 Type 2 diabetes mellitus without complications: Secondary | ICD-10-CM | POA: Diagnosis not present

## 2019-11-23 DIAGNOSIS — I5033 Acute on chronic diastolic (congestive) heart failure: Secondary | ICD-10-CM | POA: Diagnosis not present

## 2019-11-23 DIAGNOSIS — R0902 Hypoxemia: Secondary | ICD-10-CM | POA: Diagnosis not present

## 2019-11-23 DIAGNOSIS — M25511 Pain in right shoulder: Secondary | ICD-10-CM | POA: Diagnosis not present

## 2019-11-23 DIAGNOSIS — R1310 Dysphagia, unspecified: Secondary | ICD-10-CM | POA: Diagnosis not present

## 2019-11-23 DIAGNOSIS — I4891 Unspecified atrial fibrillation: Secondary | ICD-10-CM | POA: Diagnosis not present

## 2019-11-23 DIAGNOSIS — D62 Acute posthemorrhagic anemia: Secondary | ICD-10-CM | POA: Diagnosis not present

## 2019-11-23 DIAGNOSIS — R197 Diarrhea, unspecified: Secondary | ICD-10-CM | POA: Diagnosis not present

## 2019-11-23 DIAGNOSIS — M1009 Idiopathic gout, multiple sites: Secondary | ICD-10-CM | POA: Diagnosis not present

## 2019-11-23 DIAGNOSIS — G8929 Other chronic pain: Secondary | ICD-10-CM | POA: Diagnosis not present

## 2019-11-24 DIAGNOSIS — M47816 Spondylosis without myelopathy or radiculopathy, lumbar region: Secondary | ICD-10-CM | POA: Diagnosis not present

## 2019-11-24 DIAGNOSIS — K649 Unspecified hemorrhoids: Secondary | ICD-10-CM | POA: Diagnosis not present

## 2019-11-24 DIAGNOSIS — Z950 Presence of cardiac pacemaker: Secondary | ICD-10-CM | POA: Diagnosis not present

## 2019-11-24 DIAGNOSIS — M62830 Muscle spasm of back: Secondary | ICD-10-CM | POA: Diagnosis not present

## 2019-11-24 DIAGNOSIS — M5137 Other intervertebral disc degeneration, lumbosacral region: Secondary | ICD-10-CM | POA: Diagnosis not present

## 2019-11-24 DIAGNOSIS — E559 Vitamin D deficiency, unspecified: Secondary | ICD-10-CM | POA: Diagnosis not present

## 2019-11-24 DIAGNOSIS — N3000 Acute cystitis without hematuria: Secondary | ICD-10-CM | POA: Diagnosis not present

## 2019-11-24 DIAGNOSIS — R1312 Dysphagia, oropharyngeal phase: Secondary | ICD-10-CM | POA: Diagnosis not present

## 2019-11-24 DIAGNOSIS — C61 Malignant neoplasm of prostate: Secondary | ICD-10-CM | POA: Diagnosis not present

## 2019-11-24 DIAGNOSIS — I480 Paroxysmal atrial fibrillation: Secondary | ICD-10-CM | POA: Diagnosis not present

## 2019-11-24 DIAGNOSIS — E042 Nontoxic multinodular goiter: Secondary | ICD-10-CM | POA: Diagnosis not present

## 2019-11-24 DIAGNOSIS — Z9189 Other specified personal risk factors, not elsewhere classified: Secondary | ICD-10-CM | POA: Diagnosis not present

## 2019-11-24 DIAGNOSIS — Z515 Encounter for palliative care: Secondary | ICD-10-CM | POA: Diagnosis not present

## 2019-11-24 DIAGNOSIS — C3492 Malignant neoplasm of unspecified part of left bronchus or lung: Secondary | ICD-10-CM | POA: Diagnosis not present

## 2019-11-24 DIAGNOSIS — R351 Nocturia: Secondary | ICD-10-CM | POA: Diagnosis not present

## 2019-11-24 DIAGNOSIS — I739 Peripheral vascular disease, unspecified: Secondary | ICD-10-CM | POA: Diagnosis not present

## 2019-11-24 DIAGNOSIS — Z9181 History of falling: Secondary | ICD-10-CM | POA: Diagnosis not present

## 2019-11-24 DIAGNOSIS — Z23 Encounter for immunization: Secondary | ICD-10-CM | POA: Diagnosis not present

## 2019-11-24 DIAGNOSIS — Z03818 Encounter for observation for suspected exposure to other biological agents ruled out: Secondary | ICD-10-CM | POA: Diagnosis not present

## 2019-11-24 DIAGNOSIS — R41841 Cognitive communication deficit: Secondary | ICD-10-CM | POA: Diagnosis not present

## 2019-11-24 DIAGNOSIS — H409 Unspecified glaucoma: Secondary | ICD-10-CM | POA: Diagnosis not present

## 2019-11-24 DIAGNOSIS — Z20828 Contact with and (suspected) exposure to other viral communicable diseases: Secondary | ICD-10-CM | POA: Diagnosis not present

## 2019-11-24 DIAGNOSIS — I361 Nonrheumatic tricuspid (valve) insufficiency: Secondary | ICD-10-CM | POA: Diagnosis not present

## 2019-11-24 DIAGNOSIS — R310 Gross hematuria: Secondary | ICD-10-CM | POA: Diagnosis not present

## 2019-11-24 DIAGNOSIS — G4489 Other headache syndrome: Secondary | ICD-10-CM | POA: Diagnosis not present

## 2019-11-24 DIAGNOSIS — R410 Disorientation, unspecified: Secondary | ICD-10-CM | POA: Diagnosis not present

## 2019-11-24 DIAGNOSIS — I272 Pulmonary hypertension, unspecified: Secondary | ICD-10-CM | POA: Diagnosis not present

## 2019-11-24 DIAGNOSIS — M25571 Pain in right ankle and joints of right foot: Secondary | ICD-10-CM | POA: Diagnosis not present

## 2019-11-24 DIAGNOSIS — Z1211 Encounter for screening for malignant neoplasm of colon: Secondary | ICD-10-CM | POA: Diagnosis not present

## 2019-11-24 DIAGNOSIS — E079 Disorder of thyroid, unspecified: Secondary | ICD-10-CM | POA: Diagnosis not present

## 2019-11-24 DIAGNOSIS — U071 COVID-19: Secondary | ICD-10-CM | POA: Diagnosis not present

## 2019-11-24 DIAGNOSIS — M533 Sacrococcygeal disorders, not elsewhere classified: Secondary | ICD-10-CM | POA: Diagnosis not present

## 2019-11-24 DIAGNOSIS — I10 Essential (primary) hypertension: Secondary | ICD-10-CM | POA: Diagnosis not present

## 2019-11-24 DIAGNOSIS — N401 Enlarged prostate with lower urinary tract symptoms: Secondary | ICD-10-CM | POA: Diagnosis not present

## 2019-11-24 DIAGNOSIS — E785 Hyperlipidemia, unspecified: Secondary | ICD-10-CM | POA: Diagnosis not present

## 2019-11-24 DIAGNOSIS — E119 Type 2 diabetes mellitus without complications: Secondary | ICD-10-CM | POA: Diagnosis not present

## 2019-11-24 DIAGNOSIS — R319 Hematuria, unspecified: Secondary | ICD-10-CM | POA: Diagnosis not present

## 2019-11-24 DIAGNOSIS — M9903 Segmental and somatic dysfunction of lumbar region: Secondary | ICD-10-CM | POA: Diagnosis not present

## 2019-11-24 DIAGNOSIS — Z8249 Family history of ischemic heart disease and other diseases of the circulatory system: Secondary | ICD-10-CM | POA: Diagnosis not present

## 2019-11-24 DIAGNOSIS — I4891 Unspecified atrial fibrillation: Secondary | ICD-10-CM | POA: Diagnosis not present

## 2019-11-24 DIAGNOSIS — K219 Gastro-esophageal reflux disease without esophagitis: Secondary | ICD-10-CM | POA: Diagnosis not present

## 2019-11-24 DIAGNOSIS — D125 Benign neoplasm of sigmoid colon: Secondary | ICD-10-CM | POA: Diagnosis not present

## 2019-11-24 DIAGNOSIS — M488X6 Other specified spondylopathies, lumbar region: Secondary | ICD-10-CM | POA: Diagnosis not present

## 2019-11-24 DIAGNOSIS — M48062 Spinal stenosis, lumbar region with neurogenic claudication: Secondary | ICD-10-CM | POA: Diagnosis not present

## 2019-11-24 DIAGNOSIS — N3090 Cystitis, unspecified without hematuria: Secondary | ICD-10-CM | POA: Diagnosis not present

## 2019-11-24 DIAGNOSIS — M9904 Segmental and somatic dysfunction of sacral region: Secondary | ICD-10-CM | POA: Diagnosis not present

## 2019-11-24 DIAGNOSIS — M9905 Segmental and somatic dysfunction of pelvic region: Secondary | ICD-10-CM | POA: Diagnosis not present

## 2019-11-24 DIAGNOSIS — Z79891 Long term (current) use of opiate analgesic: Secondary | ICD-10-CM | POA: Diagnosis not present

## 2019-11-24 DIAGNOSIS — M1000 Idiopathic gout, unspecified site: Secondary | ICD-10-CM | POA: Diagnosis not present

## 2019-11-24 DIAGNOSIS — G4700 Insomnia, unspecified: Secondary | ICD-10-CM | POA: Diagnosis not present

## 2019-11-24 DIAGNOSIS — Z20822 Contact with and (suspected) exposure to covid-19: Secondary | ICD-10-CM | POA: Diagnosis not present

## 2019-11-24 DIAGNOSIS — M7061 Trochanteric bursitis, right hip: Secondary | ICD-10-CM | POA: Diagnosis not present

## 2019-11-24 DIAGNOSIS — J1282 Pneumonia due to coronavirus disease 2019: Secondary | ICD-10-CM | POA: Diagnosis not present

## 2019-11-24 DIAGNOSIS — M4316 Spondylolisthesis, lumbar region: Secondary | ICD-10-CM | POA: Diagnosis not present

## 2019-11-24 DIAGNOSIS — E876 Hypokalemia: Secondary | ICD-10-CM | POA: Diagnosis not present

## 2019-11-24 DIAGNOSIS — R002 Palpitations: Secondary | ICD-10-CM | POA: Diagnosis not present

## 2019-11-24 DIAGNOSIS — G9341 Metabolic encephalopathy: Secondary | ICD-10-CM | POA: Diagnosis not present

## 2019-11-24 DIAGNOSIS — J9691 Respiratory failure, unspecified with hypoxia: Secondary | ICD-10-CM | POA: Diagnosis not present

## 2019-11-24 DIAGNOSIS — R0600 Dyspnea, unspecified: Secondary | ICD-10-CM | POA: Diagnosis not present

## 2019-11-24 DIAGNOSIS — N1832 Chronic kidney disease, stage 3b: Secondary | ICD-10-CM | POA: Diagnosis not present

## 2019-11-24 DIAGNOSIS — M5136 Other intervertebral disc degeneration, lumbar region: Secondary | ICD-10-CM | POA: Diagnosis not present

## 2019-11-24 DIAGNOSIS — S022XXA Fracture of nasal bones, initial encounter for closed fracture: Secondary | ICD-10-CM | POA: Diagnosis not present

## 2019-11-24 DIAGNOSIS — M810 Age-related osteoporosis without current pathological fracture: Secondary | ICD-10-CM | POA: Diagnosis not present

## 2019-11-24 DIAGNOSIS — A419 Sepsis, unspecified organism: Secondary | ICD-10-CM | POA: Diagnosis not present

## 2019-11-24 DIAGNOSIS — J189 Pneumonia, unspecified organism: Secondary | ICD-10-CM | POA: Diagnosis not present

## 2019-11-24 DIAGNOSIS — M6281 Muscle weakness (generalized): Secondary | ICD-10-CM | POA: Diagnosis not present

## 2019-11-24 DIAGNOSIS — J849 Interstitial pulmonary disease, unspecified: Secondary | ICD-10-CM | POA: Diagnosis not present

## 2019-11-24 DIAGNOSIS — R972 Elevated prostate specific antigen [PSA]: Secondary | ICD-10-CM | POA: Diagnosis not present

## 2019-11-24 DIAGNOSIS — Z8546 Personal history of malignant neoplasm of prostate: Secondary | ICD-10-CM | POA: Diagnosis not present

## 2019-11-24 DIAGNOSIS — B20 Human immunodeficiency virus [HIV] disease: Secondary | ICD-10-CM | POA: Diagnosis not present

## 2019-11-24 DIAGNOSIS — N183 Chronic kidney disease, stage 3 unspecified: Secondary | ICD-10-CM | POA: Diagnosis not present

## 2019-11-24 DIAGNOSIS — Z7189 Other specified counseling: Secondary | ICD-10-CM | POA: Diagnosis not present

## 2019-11-24 DIAGNOSIS — F419 Anxiety disorder, unspecified: Secondary | ICD-10-CM | POA: Diagnosis not present

## 2019-11-24 DIAGNOSIS — C44319 Basal cell carcinoma of skin of other parts of face: Secondary | ICD-10-CM | POA: Diagnosis not present

## 2019-11-24 DIAGNOSIS — I70219 Atherosclerosis of native arteries of extremities with intermittent claudication, unspecified extremity: Secondary | ICD-10-CM | POA: Diagnosis not present

## 2019-11-24 DIAGNOSIS — E0520 Thyrotoxicosis with toxic multinodular goiter without thyrotoxic crisis or storm: Secondary | ICD-10-CM | POA: Diagnosis not present

## 2019-11-24 DIAGNOSIS — R2681 Unsteadiness on feet: Secondary | ICD-10-CM | POA: Diagnosis not present

## 2019-11-24 DIAGNOSIS — M79671 Pain in right foot: Secondary | ICD-10-CM | POA: Diagnosis not present

## 2019-11-24 DIAGNOSIS — I5033 Acute on chronic diastolic (congestive) heart failure: Secondary | ICD-10-CM | POA: Diagnosis not present

## 2019-11-24 DIAGNOSIS — R2689 Other abnormalities of gait and mobility: Secondary | ICD-10-CM | POA: Diagnosis not present

## 2019-11-24 DIAGNOSIS — E1110 Type 2 diabetes mellitus with ketoacidosis without coma: Secondary | ICD-10-CM | POA: Diagnosis not present

## 2019-11-24 DIAGNOSIS — J9601 Acute respiratory failure with hypoxia: Secondary | ICD-10-CM | POA: Diagnosis not present

## 2019-11-25 DIAGNOSIS — Z1329 Encounter for screening for other suspected endocrine disorder: Secondary | ICD-10-CM | POA: Diagnosis not present

## 2019-11-25 DIAGNOSIS — E113291 Type 2 diabetes mellitus with mild nonproliferative diabetic retinopathy without macular edema, right eye: Secondary | ICD-10-CM | POA: Diagnosis not present

## 2019-11-25 DIAGNOSIS — Z794 Long term (current) use of insulin: Secondary | ICD-10-CM | POA: Diagnosis not present

## 2019-11-25 DIAGNOSIS — Z7902 Long term (current) use of antithrombotics/antiplatelets: Secondary | ICD-10-CM | POA: Diagnosis not present

## 2019-11-25 DIAGNOSIS — D649 Anemia, unspecified: Secondary | ICD-10-CM | POA: Diagnosis not present

## 2019-11-25 DIAGNOSIS — R300 Dysuria: Secondary | ICD-10-CM | POA: Diagnosis not present

## 2019-11-25 DIAGNOSIS — G8929 Other chronic pain: Secondary | ICD-10-CM | POA: Diagnosis not present

## 2019-11-25 DIAGNOSIS — N390 Urinary tract infection, site not specified: Secondary | ICD-10-CM | POA: Diagnosis not present

## 2019-11-25 DIAGNOSIS — T82855A Stenosis of coronary artery stent, initial encounter: Secondary | ICD-10-CM | POA: Diagnosis not present

## 2019-11-25 DIAGNOSIS — E1142 Type 2 diabetes mellitus with diabetic polyneuropathy: Secondary | ICD-10-CM | POA: Diagnosis not present

## 2019-11-25 DIAGNOSIS — Z23 Encounter for immunization: Secondary | ICD-10-CM | POA: Diagnosis not present

## 2019-11-25 DIAGNOSIS — I5043 Acute on chronic combined systolic (congestive) and diastolic (congestive) heart failure: Secondary | ICD-10-CM | POA: Diagnosis not present

## 2019-11-25 DIAGNOSIS — D62 Acute posthemorrhagic anemia: Secondary | ICD-10-CM | POA: Diagnosis not present

## 2019-11-25 DIAGNOSIS — I2510 Atherosclerotic heart disease of native coronary artery without angina pectoris: Secondary | ICD-10-CM | POA: Diagnosis not present

## 2019-11-25 DIAGNOSIS — I214 Non-ST elevation (NSTEMI) myocardial infarction: Secondary | ICD-10-CM | POA: Diagnosis not present

## 2019-11-25 DIAGNOSIS — E890 Postprocedural hypothyroidism: Secondary | ICD-10-CM | POA: Diagnosis not present

## 2019-11-25 DIAGNOSIS — R9439 Abnormal result of other cardiovascular function study: Secondary | ICD-10-CM | POA: Diagnosis not present

## 2019-11-25 DIAGNOSIS — R2681 Unsteadiness on feet: Secondary | ICD-10-CM | POA: Diagnosis not present

## 2019-11-25 DIAGNOSIS — R600 Localized edema: Secondary | ICD-10-CM | POA: Diagnosis not present

## 2019-11-25 DIAGNOSIS — Z79891 Long term (current) use of opiate analgesic: Secondary | ICD-10-CM | POA: Diagnosis not present

## 2019-11-25 DIAGNOSIS — R338 Other retention of urine: Secondary | ICD-10-CM | POA: Diagnosis not present

## 2019-11-25 DIAGNOSIS — G20 Parkinson's disease: Secondary | ICD-10-CM | POA: Diagnosis not present

## 2019-11-25 DIAGNOSIS — G459 Transient cerebral ischemic attack, unspecified: Secondary | ICD-10-CM | POA: Diagnosis not present

## 2019-11-25 DIAGNOSIS — N200 Calculus of kidney: Secondary | ICD-10-CM | POA: Diagnosis not present

## 2019-11-25 DIAGNOSIS — E1122 Type 2 diabetes mellitus with diabetic chronic kidney disease: Secondary | ICD-10-CM | POA: Diagnosis not present

## 2019-11-25 DIAGNOSIS — J9621 Acute and chronic respiratory failure with hypoxia: Secondary | ICD-10-CM | POA: Diagnosis not present

## 2019-11-25 DIAGNOSIS — N3000 Acute cystitis without hematuria: Secondary | ICD-10-CM | POA: Diagnosis not present

## 2019-11-25 DIAGNOSIS — J849 Interstitial pulmonary disease, unspecified: Secondary | ICD-10-CM | POA: Diagnosis not present

## 2019-11-25 DIAGNOSIS — T1491XA Suicide attempt, initial encounter: Secondary | ICD-10-CM | POA: Diagnosis not present

## 2019-11-25 DIAGNOSIS — M79604 Pain in right leg: Secondary | ICD-10-CM | POA: Diagnosis not present

## 2019-11-25 DIAGNOSIS — J9602 Acute respiratory failure with hypercapnia: Secondary | ICD-10-CM | POA: Diagnosis not present

## 2019-11-25 DIAGNOSIS — E871 Hypo-osmolality and hyponatremia: Secondary | ICD-10-CM | POA: Diagnosis not present

## 2019-11-25 DIAGNOSIS — E875 Hyperkalemia: Secondary | ICD-10-CM | POA: Diagnosis not present

## 2019-11-25 DIAGNOSIS — I129 Hypertensive chronic kidney disease with stage 1 through stage 4 chronic kidney disease, or unspecified chronic kidney disease: Secondary | ICD-10-CM | POA: Diagnosis not present

## 2019-11-25 DIAGNOSIS — Z5181 Encounter for therapeutic drug level monitoring: Secondary | ICD-10-CM | POA: Diagnosis not present

## 2019-11-25 DIAGNOSIS — N1832 Chronic kidney disease, stage 3b: Secondary | ICD-10-CM | POA: Diagnosis not present

## 2019-11-25 DIAGNOSIS — M5136 Other intervertebral disc degeneration, lumbar region: Secondary | ICD-10-CM | POA: Diagnosis not present

## 2019-11-25 DIAGNOSIS — I252 Old myocardial infarction: Secondary | ICD-10-CM | POA: Diagnosis not present

## 2019-11-25 DIAGNOSIS — M47816 Spondylosis without myelopathy or radiculopathy, lumbar region: Secondary | ICD-10-CM | POA: Diagnosis not present

## 2019-11-25 DIAGNOSIS — M129 Arthropathy, unspecified: Secondary | ICD-10-CM | POA: Diagnosis not present

## 2019-11-25 DIAGNOSIS — R2689 Other abnormalities of gait and mobility: Secondary | ICD-10-CM | POA: Diagnosis not present

## 2019-11-25 DIAGNOSIS — G894 Chronic pain syndrome: Secondary | ICD-10-CM | POA: Diagnosis not present

## 2019-11-25 DIAGNOSIS — Z20828 Contact with and (suspected) exposure to other viral communicable diseases: Secondary | ICD-10-CM | POA: Diagnosis not present

## 2019-11-25 DIAGNOSIS — R399 Unspecified symptoms and signs involving the genitourinary system: Secondary | ICD-10-CM | POA: Diagnosis not present

## 2019-11-25 DIAGNOSIS — R42 Dizziness and giddiness: Secondary | ICD-10-CM | POA: Diagnosis not present

## 2019-11-25 DIAGNOSIS — K912 Postsurgical malabsorption, not elsewhere classified: Secondary | ICD-10-CM | POA: Diagnosis not present

## 2019-11-25 DIAGNOSIS — D689 Coagulation defect, unspecified: Secondary | ICD-10-CM | POA: Diagnosis not present

## 2019-11-25 DIAGNOSIS — Z1389 Encounter for screening for other disorder: Secondary | ICD-10-CM | POA: Diagnosis not present

## 2019-11-25 DIAGNOSIS — F419 Anxiety disorder, unspecified: Secondary | ICD-10-CM | POA: Diagnosis not present

## 2019-11-25 DIAGNOSIS — R3129 Other microscopic hematuria: Secondary | ICD-10-CM | POA: Diagnosis not present

## 2019-11-25 DIAGNOSIS — M1711 Unilateral primary osteoarthritis, right knee: Secondary | ICD-10-CM | POA: Diagnosis not present

## 2019-11-25 DIAGNOSIS — E119 Type 2 diabetes mellitus without complications: Secondary | ICD-10-CM | POA: Diagnosis not present

## 2019-11-25 DIAGNOSIS — M160 Bilateral primary osteoarthritis of hip: Secondary | ICD-10-CM | POA: Diagnosis not present

## 2019-11-25 DIAGNOSIS — D509 Iron deficiency anemia, unspecified: Secondary | ICD-10-CM | POA: Diagnosis not present

## 2019-11-25 DIAGNOSIS — K861 Other chronic pancreatitis: Secondary | ICD-10-CM | POA: Diagnosis not present

## 2019-11-25 DIAGNOSIS — G301 Alzheimer's disease with late onset: Secondary | ICD-10-CM | POA: Diagnosis not present

## 2019-11-25 DIAGNOSIS — R45851 Suicidal ideations: Secondary | ICD-10-CM | POA: Diagnosis not present

## 2019-11-25 DIAGNOSIS — I119 Hypertensive heart disease without heart failure: Secondary | ICD-10-CM | POA: Diagnosis not present

## 2019-11-25 DIAGNOSIS — Z20822 Contact with and (suspected) exposure to covid-19: Secondary | ICD-10-CM | POA: Diagnosis not present

## 2019-11-25 DIAGNOSIS — Z91038 Other insect allergy status: Secondary | ICD-10-CM | POA: Diagnosis not present

## 2019-11-25 DIAGNOSIS — R390 Extravasation of urine: Secondary | ICD-10-CM | POA: Diagnosis not present

## 2019-11-25 DIAGNOSIS — R972 Elevated prostate specific antigen [PSA]: Secondary | ICD-10-CM | POA: Diagnosis not present

## 2019-11-25 DIAGNOSIS — W19XXXA Unspecified fall, initial encounter: Secondary | ICD-10-CM | POA: Diagnosis not present

## 2019-11-25 DIAGNOSIS — Z7982 Long term (current) use of aspirin: Secondary | ICD-10-CM | POA: Diagnosis not present

## 2019-11-25 DIAGNOSIS — E559 Vitamin D deficiency, unspecified: Secondary | ICD-10-CM | POA: Diagnosis not present

## 2019-11-25 DIAGNOSIS — M6281 Muscle weakness (generalized): Secondary | ICD-10-CM | POA: Diagnosis not present

## 2019-11-25 DIAGNOSIS — I10 Essential (primary) hypertension: Secondary | ICD-10-CM | POA: Diagnosis not present

## 2019-11-25 DIAGNOSIS — Z8619 Personal history of other infectious and parasitic diseases: Secondary | ICD-10-CM | POA: Diagnosis not present

## 2019-11-25 DIAGNOSIS — C679 Malignant neoplasm of bladder, unspecified: Secondary | ICD-10-CM | POA: Diagnosis not present

## 2019-11-25 DIAGNOSIS — E1021 Type 1 diabetes mellitus with diabetic nephropathy: Secondary | ICD-10-CM | POA: Diagnosis not present

## 2019-11-25 DIAGNOSIS — N183 Chronic kidney disease, stage 3 unspecified: Secondary | ICD-10-CM | POA: Diagnosis not present

## 2019-11-25 DIAGNOSIS — F333 Major depressive disorder, recurrent, severe with psychotic symptoms: Secondary | ICD-10-CM | POA: Diagnosis not present

## 2019-11-25 DIAGNOSIS — R8290 Unspecified abnormal findings in urine: Secondary | ICD-10-CM | POA: Diagnosis not present

## 2019-11-25 DIAGNOSIS — E876 Hypokalemia: Secondary | ICD-10-CM | POA: Diagnosis not present

## 2019-11-25 DIAGNOSIS — Z0000 Encounter for general adult medical examination without abnormal findings: Secondary | ICD-10-CM | POA: Diagnosis not present

## 2019-11-25 DIAGNOSIS — U071 COVID-19: Secondary | ICD-10-CM | POA: Diagnosis not present

## 2019-11-25 DIAGNOSIS — J441 Chronic obstructive pulmonary disease with (acute) exacerbation: Secondary | ICD-10-CM | POA: Diagnosis not present

## 2019-11-25 DIAGNOSIS — J9811 Atelectasis: Secondary | ICD-10-CM | POA: Diagnosis not present

## 2019-11-25 DIAGNOSIS — J189 Pneumonia, unspecified organism: Secondary | ICD-10-CM | POA: Diagnosis not present

## 2019-11-25 DIAGNOSIS — R809 Proteinuria, unspecified: Secondary | ICD-10-CM | POA: Diagnosis not present

## 2019-11-25 DIAGNOSIS — R739 Hyperglycemia, unspecified: Secondary | ICD-10-CM | POA: Diagnosis not present

## 2019-11-25 DIAGNOSIS — M62551 Muscle wasting and atrophy, not elsewhere classified, right thigh: Secondary | ICD-10-CM | POA: Diagnosis not present

## 2019-11-25 DIAGNOSIS — G3281 Cerebellar ataxia in diseases classified elsewhere: Secondary | ICD-10-CM | POA: Diagnosis not present

## 2019-11-25 DIAGNOSIS — Z79899 Other long term (current) drug therapy: Secondary | ICD-10-CM | POA: Diagnosis not present

## 2019-11-25 DIAGNOSIS — E6609 Other obesity due to excess calories: Secondary | ICD-10-CM | POA: Diagnosis not present

## 2019-11-25 DIAGNOSIS — N3946 Mixed incontinence: Secondary | ICD-10-CM | POA: Diagnosis not present

## 2019-11-25 DIAGNOSIS — E210 Primary hyperparathyroidism: Secondary | ICD-10-CM | POA: Diagnosis not present

## 2019-11-25 DIAGNOSIS — M25512 Pain in left shoulder: Secondary | ICD-10-CM | POA: Diagnosis not present

## 2019-11-25 DIAGNOSIS — J9601 Acute respiratory failure with hypoxia: Secondary | ICD-10-CM | POA: Diagnosis not present

## 2019-11-25 DIAGNOSIS — Z8673 Personal history of transient ischemic attack (TIA), and cerebral infarction without residual deficits: Secondary | ICD-10-CM | POA: Diagnosis not present

## 2019-11-25 DIAGNOSIS — Z7984 Long term (current) use of oral hypoglycemic drugs: Secondary | ICD-10-CM | POA: Diagnosis not present

## 2019-11-25 DIAGNOSIS — N343 Urethral syndrome, unspecified: Secondary | ICD-10-CM | POA: Diagnosis not present

## 2019-11-25 DIAGNOSIS — N3001 Acute cystitis with hematuria: Secondary | ICD-10-CM | POA: Diagnosis not present

## 2019-11-25 DIAGNOSIS — G4733 Obstructive sleep apnea (adult) (pediatric): Secondary | ICD-10-CM | POA: Diagnosis not present

## 2019-11-25 DIAGNOSIS — K219 Gastro-esophageal reflux disease without esophagitis: Secondary | ICD-10-CM | POA: Diagnosis not present

## 2019-11-25 DIAGNOSIS — Z1272 Encounter for screening for malignant neoplasm of vagina: Secondary | ICD-10-CM | POA: Diagnosis not present

## 2019-11-25 DIAGNOSIS — Z131 Encounter for screening for diabetes mellitus: Secondary | ICD-10-CM | POA: Diagnosis not present

## 2019-11-25 DIAGNOSIS — R7989 Other specified abnormal findings of blood chemistry: Secondary | ICD-10-CM | POA: Diagnosis not present

## 2019-11-25 DIAGNOSIS — Z1211 Encounter for screening for malignant neoplasm of colon: Secondary | ICD-10-CM | POA: Diagnosis not present

## 2019-11-25 DIAGNOSIS — F0280 Dementia in other diseases classified elsewhere without behavioral disturbance: Secondary | ICD-10-CM | POA: Diagnosis not present

## 2019-11-25 DIAGNOSIS — N1831 Chronic kidney disease, stage 3a: Secondary | ICD-10-CM | POA: Diagnosis not present

## 2019-11-25 DIAGNOSIS — I1310 Hypertensive heart and chronic kidney disease without heart failure, with stage 1 through stage 4 chronic kidney disease, or unspecified chronic kidney disease: Secondary | ICD-10-CM | POA: Diagnosis not present

## 2019-11-25 DIAGNOSIS — Z9181 History of falling: Secondary | ICD-10-CM | POA: Diagnosis not present

## 2019-11-25 DIAGNOSIS — M1990 Unspecified osteoarthritis, unspecified site: Secondary | ICD-10-CM | POA: Diagnosis not present

## 2019-11-25 DIAGNOSIS — N179 Acute kidney failure, unspecified: Secondary | ICD-10-CM | POA: Diagnosis not present

## 2019-11-25 DIAGNOSIS — M62552 Muscle wasting and atrophy, not elsewhere classified, left thigh: Secondary | ICD-10-CM | POA: Diagnosis not present

## 2019-11-26 DIAGNOSIS — Z9981 Dependence on supplemental oxygen: Secondary | ICD-10-CM | POA: Diagnosis not present

## 2019-11-26 DIAGNOSIS — D509 Iron deficiency anemia, unspecified: Secondary | ICD-10-CM | POA: Diagnosis not present

## 2019-11-26 DIAGNOSIS — Z6828 Body mass index (BMI) 28.0-28.9, adult: Secondary | ICD-10-CM | POA: Diagnosis not present

## 2019-11-26 DIAGNOSIS — E1159 Type 2 diabetes mellitus with other circulatory complications: Secondary | ICD-10-CM | POA: Diagnosis not present

## 2019-11-26 DIAGNOSIS — E113512 Type 2 diabetes mellitus with proliferative diabetic retinopathy with macular edema, left eye: Secondary | ICD-10-CM | POA: Diagnosis not present

## 2019-11-26 DIAGNOSIS — R339 Retention of urine, unspecified: Secondary | ICD-10-CM | POA: Diagnosis not present

## 2019-11-26 DIAGNOSIS — R8290 Unspecified abnormal findings in urine: Secondary | ICD-10-CM | POA: Diagnosis not present

## 2019-11-26 DIAGNOSIS — E10319 Type 1 diabetes mellitus with unspecified diabetic retinopathy without macular edema: Secondary | ICD-10-CM | POA: Diagnosis not present

## 2019-11-26 DIAGNOSIS — H401331 Pigmentary glaucoma, bilateral, mild stage: Secondary | ICD-10-CM | POA: Diagnosis not present

## 2019-11-26 DIAGNOSIS — H3582 Retinal ischemia: Secondary | ICD-10-CM | POA: Diagnosis not present

## 2019-11-26 DIAGNOSIS — H35373 Puckering of macula, bilateral: Secondary | ICD-10-CM | POA: Diagnosis not present

## 2019-11-26 DIAGNOSIS — J342 Deviated nasal septum: Secondary | ICD-10-CM | POA: Diagnosis not present

## 2019-11-26 DIAGNOSIS — G459 Transient cerebral ischemic attack, unspecified: Secondary | ICD-10-CM | POA: Diagnosis not present

## 2019-11-26 DIAGNOSIS — S065X0D Traumatic subdural hemorrhage without loss of consciousness, subsequent encounter: Secondary | ICD-10-CM | POA: Diagnosis not present

## 2019-11-26 DIAGNOSIS — E1022 Type 1 diabetes mellitus with diabetic chronic kidney disease: Secondary | ICD-10-CM | POA: Diagnosis not present

## 2019-11-26 DIAGNOSIS — R7303 Prediabetes: Secondary | ICD-10-CM | POA: Diagnosis not present

## 2019-11-26 DIAGNOSIS — N419 Inflammatory disease of prostate, unspecified: Secondary | ICD-10-CM | POA: Diagnosis not present

## 2019-11-26 DIAGNOSIS — E669 Obesity, unspecified: Secondary | ICD-10-CM | POA: Diagnosis not present

## 2019-11-26 DIAGNOSIS — M7918 Myalgia, other site: Secondary | ICD-10-CM | POA: Diagnosis not present

## 2019-11-26 DIAGNOSIS — E1065 Type 1 diabetes mellitus with hyperglycemia: Secondary | ICD-10-CM | POA: Diagnosis not present

## 2019-11-26 DIAGNOSIS — I480 Paroxysmal atrial fibrillation: Secondary | ICD-10-CM | POA: Diagnosis not present

## 2019-11-26 DIAGNOSIS — Z961 Presence of intraocular lens: Secondary | ICD-10-CM | POA: Diagnosis not present

## 2019-11-26 DIAGNOSIS — I110 Hypertensive heart disease with heart failure: Secondary | ICD-10-CM | POA: Diagnosis not present

## 2019-11-26 DIAGNOSIS — U071 COVID-19: Secondary | ICD-10-CM | POA: Diagnosis not present

## 2019-11-26 DIAGNOSIS — I872 Venous insufficiency (chronic) (peripheral): Secondary | ICD-10-CM | POA: Diagnosis not present

## 2019-11-26 DIAGNOSIS — Z86718 Personal history of other venous thrombosis and embolism: Secondary | ICD-10-CM | POA: Diagnosis not present

## 2019-11-26 DIAGNOSIS — Z20822 Contact with and (suspected) exposure to covid-19: Secondary | ICD-10-CM | POA: Diagnosis not present

## 2019-11-26 DIAGNOSIS — M545 Low back pain: Secondary | ICD-10-CM | POA: Diagnosis not present

## 2019-11-26 DIAGNOSIS — I739 Peripheral vascular disease, unspecified: Secondary | ICD-10-CM | POA: Diagnosis not present

## 2019-11-26 DIAGNOSIS — C61 Malignant neoplasm of prostate: Secondary | ICD-10-CM | POA: Diagnosis not present

## 2019-11-26 DIAGNOSIS — R300 Dysuria: Secondary | ICD-10-CM | POA: Diagnosis not present

## 2019-11-26 DIAGNOSIS — Z6831 Body mass index (BMI) 31.0-31.9, adult: Secondary | ICD-10-CM | POA: Diagnosis not present

## 2019-11-26 DIAGNOSIS — Z87891 Personal history of nicotine dependence: Secondary | ICD-10-CM | POA: Diagnosis not present

## 2019-11-26 DIAGNOSIS — J9601 Acute respiratory failure with hypoxia: Secondary | ICD-10-CM | POA: Diagnosis not present

## 2019-11-26 DIAGNOSIS — E039 Hypothyroidism, unspecified: Secondary | ICD-10-CM | POA: Diagnosis not present

## 2019-11-26 DIAGNOSIS — Z951 Presence of aortocoronary bypass graft: Secondary | ICD-10-CM | POA: Diagnosis not present

## 2019-11-26 DIAGNOSIS — F0280 Dementia in other diseases classified elsewhere without behavioral disturbance: Secondary | ICD-10-CM | POA: Diagnosis not present

## 2019-11-26 DIAGNOSIS — E785 Hyperlipidemia, unspecified: Secondary | ICD-10-CM | POA: Diagnosis not present

## 2019-11-26 DIAGNOSIS — B9620 Unspecified Escherichia coli [E. coli] as the cause of diseases classified elsewhere: Secondary | ICD-10-CM | POA: Diagnosis not present

## 2019-11-26 DIAGNOSIS — Z7984 Long term (current) use of oral hypoglycemic drugs: Secondary | ICD-10-CM | POA: Diagnosis not present

## 2019-11-26 DIAGNOSIS — I129 Hypertensive chronic kidney disease with stage 1 through stage 4 chronic kidney disease, or unspecified chronic kidney disease: Secondary | ICD-10-CM | POA: Diagnosis not present

## 2019-11-26 DIAGNOSIS — L97522 Non-pressure chronic ulcer of other part of left foot with fat layer exposed: Secondary | ICD-10-CM | POA: Diagnosis not present

## 2019-11-26 DIAGNOSIS — S42031D Displaced fracture of lateral end of right clavicle, subsequent encounter for fracture with routine healing: Secondary | ICD-10-CM | POA: Diagnosis not present

## 2019-11-26 DIAGNOSIS — N400 Enlarged prostate without lower urinary tract symptoms: Secondary | ICD-10-CM | POA: Diagnosis not present

## 2019-11-26 DIAGNOSIS — R109 Unspecified abdominal pain: Secondary | ICD-10-CM | POA: Diagnosis not present

## 2019-11-26 DIAGNOSIS — E113511 Type 2 diabetes mellitus with proliferative diabetic retinopathy with macular edema, right eye: Secondary | ICD-10-CM | POA: Diagnosis not present

## 2019-11-26 DIAGNOSIS — E118 Type 2 diabetes mellitus with unspecified complications: Secondary | ICD-10-CM | POA: Diagnosis not present

## 2019-11-26 DIAGNOSIS — E875 Hyperkalemia: Secondary | ICD-10-CM | POA: Diagnosis not present

## 2019-11-26 DIAGNOSIS — R296 Repeated falls: Secondary | ICD-10-CM | POA: Diagnosis not present

## 2019-11-26 DIAGNOSIS — Z87442 Personal history of urinary calculi: Secondary | ICD-10-CM | POA: Diagnosis not present

## 2019-11-26 DIAGNOSIS — I2510 Atherosclerotic heart disease of native coronary artery without angina pectoris: Secondary | ICD-10-CM | POA: Diagnosis not present

## 2019-11-26 DIAGNOSIS — E1122 Type 2 diabetes mellitus with diabetic chronic kidney disease: Secondary | ICD-10-CM | POA: Diagnosis not present

## 2019-11-26 DIAGNOSIS — E876 Hypokalemia: Secondary | ICD-10-CM | POA: Diagnosis not present

## 2019-11-26 DIAGNOSIS — R6889 Other general symptoms and signs: Secondary | ICD-10-CM | POA: Diagnosis not present

## 2019-11-26 DIAGNOSIS — E1165 Type 2 diabetes mellitus with hyperglycemia: Secondary | ICD-10-CM | POA: Diagnosis not present

## 2019-11-26 DIAGNOSIS — J440 Chronic obstructive pulmonary disease with acute lower respiratory infection: Secondary | ICD-10-CM | POA: Diagnosis not present

## 2019-11-26 DIAGNOSIS — E890 Postprocedural hypothyroidism: Secondary | ICD-10-CM | POA: Diagnosis not present

## 2019-11-26 DIAGNOSIS — G4733 Obstructive sleep apnea (adult) (pediatric): Secondary | ICD-10-CM | POA: Diagnosis not present

## 2019-11-26 DIAGNOSIS — G8929 Other chronic pain: Secondary | ICD-10-CM | POA: Diagnosis not present

## 2019-11-26 DIAGNOSIS — S82142D Displaced bicondylar fracture of left tibia, subsequent encounter for closed fracture with routine healing: Secondary | ICD-10-CM | POA: Diagnosis not present

## 2019-11-26 DIAGNOSIS — M0579 Rheumatoid arthritis with rheumatoid factor of multiple sites without organ or systems involvement: Secondary | ICD-10-CM | POA: Diagnosis not present

## 2019-11-26 DIAGNOSIS — J3489 Other specified disorders of nose and nasal sinuses: Secondary | ICD-10-CM | POA: Diagnosis not present

## 2019-11-26 DIAGNOSIS — Z6836 Body mass index (BMI) 36.0-36.9, adult: Secondary | ICD-10-CM | POA: Diagnosis not present

## 2019-11-26 DIAGNOSIS — L03116 Cellulitis of left lower limb: Secondary | ICD-10-CM | POA: Diagnosis not present

## 2019-11-26 DIAGNOSIS — R509 Fever, unspecified: Secondary | ICD-10-CM | POA: Diagnosis not present

## 2019-11-26 DIAGNOSIS — R2681 Unsteadiness on feet: Secondary | ICD-10-CM | POA: Diagnosis not present

## 2019-11-26 DIAGNOSIS — M6281 Muscle weakness (generalized): Secondary | ICD-10-CM | POA: Diagnosis not present

## 2019-11-26 DIAGNOSIS — E8809 Other disorders of plasma-protein metabolism, not elsewhere classified: Secondary | ICD-10-CM | POA: Diagnosis not present

## 2019-11-26 DIAGNOSIS — S42021D Displaced fracture of shaft of right clavicle, subsequent encounter for fracture with routine healing: Secondary | ICD-10-CM | POA: Diagnosis not present

## 2019-11-26 DIAGNOSIS — E1143 Type 2 diabetes mellitus with diabetic autonomic (poly)neuropathy: Secondary | ICD-10-CM | POA: Diagnosis not present

## 2019-11-26 DIAGNOSIS — N390 Urinary tract infection, site not specified: Secondary | ICD-10-CM | POA: Diagnosis not present

## 2019-11-26 DIAGNOSIS — Z79899 Other long term (current) drug therapy: Secondary | ICD-10-CM | POA: Diagnosis not present

## 2019-11-26 DIAGNOSIS — I10 Essential (primary) hypertension: Secondary | ICD-10-CM | POA: Diagnosis not present

## 2019-11-26 DIAGNOSIS — D62 Acute posthemorrhagic anemia: Secondary | ICD-10-CM | POA: Diagnosis not present

## 2019-11-26 DIAGNOSIS — H35033 Hypertensive retinopathy, bilateral: Secondary | ICD-10-CM | POA: Diagnosis not present

## 2019-11-26 DIAGNOSIS — Z9181 History of falling: Secondary | ICD-10-CM | POA: Diagnosis not present

## 2019-11-26 DIAGNOSIS — N179 Acute kidney failure, unspecified: Secondary | ICD-10-CM | POA: Diagnosis not present

## 2019-11-26 DIAGNOSIS — M1990 Unspecified osteoarthritis, unspecified site: Secondary | ICD-10-CM | POA: Diagnosis not present

## 2019-11-26 DIAGNOSIS — Z8673 Personal history of transient ischemic attack (TIA), and cerebral infarction without residual deficits: Secondary | ICD-10-CM | POA: Diagnosis not present

## 2019-11-26 DIAGNOSIS — D649 Anemia, unspecified: Secondary | ICD-10-CM | POA: Diagnosis not present

## 2019-11-26 DIAGNOSIS — F419 Anxiety disorder, unspecified: Secondary | ICD-10-CM | POA: Diagnosis not present

## 2019-11-26 DIAGNOSIS — Z9641 Presence of insulin pump (external) (internal): Secondary | ICD-10-CM | POA: Diagnosis not present

## 2019-11-26 DIAGNOSIS — Z20828 Contact with and (suspected) exposure to other viral communicable diseases: Secondary | ICD-10-CM | POA: Diagnosis not present

## 2019-11-26 DIAGNOSIS — H04123 Dry eye syndrome of bilateral lacrimal glands: Secondary | ICD-10-CM | POA: Diagnosis not present

## 2019-11-26 DIAGNOSIS — R002 Palpitations: Secondary | ICD-10-CM | POA: Diagnosis not present

## 2019-11-26 DIAGNOSIS — N401 Enlarged prostate with lower urinary tract symptoms: Secondary | ICD-10-CM | POA: Diagnosis not present

## 2019-11-26 DIAGNOSIS — R233 Spontaneous ecchymoses: Secondary | ICD-10-CM | POA: Diagnosis not present

## 2019-11-26 DIAGNOSIS — J9811 Atelectasis: Secondary | ICD-10-CM | POA: Diagnosis not present

## 2019-11-26 DIAGNOSIS — S8012XD Contusion of left lower leg, subsequent encounter: Secondary | ICD-10-CM | POA: Diagnosis not present

## 2019-11-26 DIAGNOSIS — G2581 Restless legs syndrome: Secondary | ICD-10-CM | POA: Diagnosis not present

## 2019-11-26 DIAGNOSIS — F329 Major depressive disorder, single episode, unspecified: Secondary | ICD-10-CM | POA: Diagnosis not present

## 2019-11-26 DIAGNOSIS — E559 Vitamin D deficiency, unspecified: Secondary | ICD-10-CM | POA: Diagnosis not present

## 2019-11-26 DIAGNOSIS — Z794 Long term (current) use of insulin: Secondary | ICD-10-CM | POA: Diagnosis not present

## 2019-11-26 DIAGNOSIS — E1169 Type 2 diabetes mellitus with other specified complication: Secondary | ICD-10-CM | POA: Diagnosis not present

## 2019-11-26 DIAGNOSIS — J1282 Pneumonia due to coronavirus disease 2019: Secondary | ICD-10-CM | POA: Diagnosis not present

## 2019-11-26 DIAGNOSIS — Z96612 Presence of left artificial shoulder joint: Secondary | ICD-10-CM | POA: Diagnosis not present

## 2019-11-26 DIAGNOSIS — E538 Deficiency of other specified B group vitamins: Secondary | ICD-10-CM | POA: Diagnosis not present

## 2019-11-26 DIAGNOSIS — R413 Other amnesia: Secondary | ICD-10-CM | POA: Diagnosis not present

## 2019-11-26 DIAGNOSIS — G43109 Migraine with aura, not intractable, without status migrainosus: Secondary | ICD-10-CM | POA: Diagnosis not present

## 2019-11-26 DIAGNOSIS — R9721 Rising PSA following treatment for malignant neoplasm of prostate: Secondary | ICD-10-CM | POA: Diagnosis not present

## 2019-11-26 DIAGNOSIS — I700 Atherosclerosis of aorta: Secondary | ICD-10-CM | POA: Diagnosis not present

## 2019-11-26 DIAGNOSIS — E1142 Type 2 diabetes mellitus with diabetic polyneuropathy: Secondary | ICD-10-CM | POA: Diagnosis not present

## 2019-11-26 DIAGNOSIS — G894 Chronic pain syndrome: Secondary | ICD-10-CM | POA: Diagnosis not present

## 2019-11-26 DIAGNOSIS — R197 Diarrhea, unspecified: Secondary | ICD-10-CM | POA: Diagnosis not present

## 2019-11-26 DIAGNOSIS — R0602 Shortness of breath: Secondary | ICD-10-CM | POA: Diagnosis not present

## 2019-11-26 DIAGNOSIS — Z96653 Presence of artificial knee joint, bilateral: Secondary | ICD-10-CM | POA: Diagnosis not present

## 2019-11-26 DIAGNOSIS — R82998 Other abnormal findings in urine: Secondary | ICD-10-CM | POA: Diagnosis not present

## 2019-11-26 DIAGNOSIS — E519 Thiamine deficiency, unspecified: Secondary | ICD-10-CM | POA: Diagnosis not present

## 2019-11-26 DIAGNOSIS — K219 Gastro-esophageal reflux disease without esophagitis: Secondary | ICD-10-CM | POA: Diagnosis not present

## 2019-11-26 DIAGNOSIS — Z7982 Long term (current) use of aspirin: Secondary | ICD-10-CM | POA: Diagnosis not present

## 2019-11-26 DIAGNOSIS — E668 Other obesity: Secondary | ICD-10-CM | POA: Diagnosis not present

## 2019-11-26 DIAGNOSIS — Z125 Encounter for screening for malignant neoplasm of prostate: Secondary | ICD-10-CM | POA: Diagnosis not present

## 2019-11-26 DIAGNOSIS — R4701 Aphasia: Secondary | ICD-10-CM | POA: Diagnosis not present

## 2019-11-26 DIAGNOSIS — H31093 Other chorioretinal scars, bilateral: Secondary | ICD-10-CM | POA: Diagnosis not present

## 2019-11-26 DIAGNOSIS — N183 Chronic kidney disease, stage 3 unspecified: Secondary | ICD-10-CM | POA: Diagnosis not present

## 2019-11-26 DIAGNOSIS — N3090 Cystitis, unspecified without hematuria: Secondary | ICD-10-CM | POA: Diagnosis not present

## 2019-11-26 DIAGNOSIS — Z6826 Body mass index (BMI) 26.0-26.9, adult: Secondary | ICD-10-CM | POA: Diagnosis not present

## 2019-11-26 DIAGNOSIS — A4189 Other specified sepsis: Secondary | ICD-10-CM | POA: Diagnosis not present

## 2019-11-26 DIAGNOSIS — E782 Mixed hyperlipidemia: Secondary | ICD-10-CM | POA: Diagnosis not present

## 2019-11-26 DIAGNOSIS — M1712 Unilateral primary osteoarthritis, left knee: Secondary | ICD-10-CM | POA: Diagnosis not present

## 2019-11-27 DIAGNOSIS — N390 Urinary tract infection, site not specified: Secondary | ICD-10-CM | POA: Diagnosis not present

## 2019-11-27 DIAGNOSIS — J9621 Acute and chronic respiratory failure with hypoxia: Secondary | ICD-10-CM | POA: Diagnosis not present

## 2019-11-27 DIAGNOSIS — E870 Hyperosmolality and hypernatremia: Secondary | ICD-10-CM | POA: Diagnosis not present

## 2019-11-27 DIAGNOSIS — F0280 Dementia in other diseases classified elsewhere without behavioral disturbance: Secondary | ICD-10-CM | POA: Diagnosis not present

## 2019-11-27 DIAGNOSIS — R6520 Severe sepsis without septic shock: Secondary | ICD-10-CM | POA: Diagnosis not present

## 2019-11-27 DIAGNOSIS — Z8673 Personal history of transient ischemic attack (TIA), and cerebral infarction without residual deficits: Secondary | ICD-10-CM | POA: Diagnosis not present

## 2019-11-27 DIAGNOSIS — U071 COVID-19: Secondary | ICD-10-CM | POA: Diagnosis not present

## 2019-11-27 DIAGNOSIS — E860 Dehydration: Secondary | ICD-10-CM | POA: Diagnosis not present

## 2019-11-27 DIAGNOSIS — R300 Dysuria: Secondary | ICD-10-CM | POA: Diagnosis not present

## 2019-11-27 DIAGNOSIS — E876 Hypokalemia: Secondary | ICD-10-CM | POA: Diagnosis not present

## 2019-11-27 DIAGNOSIS — M79671 Pain in right foot: Secondary | ICD-10-CM | POA: Diagnosis not present

## 2019-11-27 DIAGNOSIS — N179 Acute kidney failure, unspecified: Secondary | ICD-10-CM | POA: Diagnosis not present

## 2019-11-27 DIAGNOSIS — Z825 Family history of asthma and other chronic lower respiratory diseases: Secondary | ICD-10-CM | POA: Diagnosis not present

## 2019-11-27 DIAGNOSIS — Z79899 Other long term (current) drug therapy: Secondary | ICD-10-CM | POA: Diagnosis not present

## 2019-11-27 DIAGNOSIS — A4189 Other specified sepsis: Secondary | ICD-10-CM | POA: Diagnosis not present

## 2019-11-27 DIAGNOSIS — E039 Hypothyroidism, unspecified: Secondary | ICD-10-CM | POA: Diagnosis not present

## 2019-11-27 DIAGNOSIS — Z03818 Encounter for observation for suspected exposure to other biological agents ruled out: Secondary | ICD-10-CM | POA: Diagnosis not present

## 2019-11-27 DIAGNOSIS — F0390 Unspecified dementia without behavioral disturbance: Secondary | ICD-10-CM | POA: Diagnosis not present

## 2019-11-27 DIAGNOSIS — Z79890 Hormone replacement therapy (postmenopausal): Secondary | ICD-10-CM | POA: Diagnosis not present

## 2019-11-27 DIAGNOSIS — E785 Hyperlipidemia, unspecified: Secondary | ICD-10-CM | POA: Diagnosis not present

## 2019-11-27 DIAGNOSIS — G9341 Metabolic encephalopathy: Secondary | ICD-10-CM | POA: Diagnosis not present

## 2019-11-27 DIAGNOSIS — Z20828 Contact with and (suspected) exposure to other viral communicable diseases: Secondary | ICD-10-CM | POA: Diagnosis not present

## 2019-11-27 DIAGNOSIS — Z66 Do not resuscitate: Secondary | ICD-10-CM | POA: Diagnosis not present

## 2019-11-27 DIAGNOSIS — Z7901 Long term (current) use of anticoagulants: Secondary | ICD-10-CM | POA: Diagnosis not present

## 2019-11-27 DIAGNOSIS — Z515 Encounter for palliative care: Secondary | ICD-10-CM | POA: Diagnosis not present

## 2019-11-27 DIAGNOSIS — R627 Adult failure to thrive: Secondary | ICD-10-CM | POA: Diagnosis not present

## 2019-11-27 DIAGNOSIS — M79672 Pain in left foot: Secondary | ICD-10-CM | POA: Diagnosis not present

## 2019-11-27 DIAGNOSIS — Z8546 Personal history of malignant neoplasm of prostate: Secondary | ICD-10-CM | POA: Diagnosis not present

## 2019-11-27 DIAGNOSIS — R1310 Dysphagia, unspecified: Secondary | ICD-10-CM | POA: Diagnosis not present

## 2019-11-27 DIAGNOSIS — J1282 Pneumonia due to coronavirus disease 2019: Secondary | ICD-10-CM | POA: Diagnosis not present

## 2019-11-27 DIAGNOSIS — Z86718 Personal history of other venous thrombosis and embolism: Secondary | ICD-10-CM | POA: Diagnosis not present

## 2019-11-28 DIAGNOSIS — M1990 Unspecified osteoarthritis, unspecified site: Secondary | ICD-10-CM | POA: Diagnosis not present

## 2019-11-28 DIAGNOSIS — E7800 Pure hypercholesterolemia, unspecified: Secondary | ICD-10-CM | POA: Diagnosis not present

## 2019-11-28 DIAGNOSIS — Z743 Need for continuous supervision: Secondary | ICD-10-CM | POA: Diagnosis not present

## 2019-11-28 DIAGNOSIS — R279 Unspecified lack of coordination: Secondary | ICD-10-CM | POA: Diagnosis not present

## 2019-11-28 DIAGNOSIS — I447 Left bundle-branch block, unspecified: Secondary | ICD-10-CM | POA: Diagnosis not present

## 2019-11-28 DIAGNOSIS — M810 Age-related osteoporosis without current pathological fracture: Secondary | ICD-10-CM | POA: Diagnosis not present

## 2019-11-28 DIAGNOSIS — E876 Hypokalemia: Secondary | ICD-10-CM | POA: Diagnosis not present

## 2019-11-28 DIAGNOSIS — R239 Unspecified skin changes: Secondary | ICD-10-CM | POA: Diagnosis not present

## 2019-11-28 DIAGNOSIS — E119 Type 2 diabetes mellitus without complications: Secondary | ICD-10-CM | POA: Diagnosis not present

## 2019-11-28 DIAGNOSIS — N6019 Diffuse cystic mastopathy of unspecified breast: Secondary | ICD-10-CM | POA: Diagnosis not present

## 2019-11-28 DIAGNOSIS — R0981 Nasal congestion: Secondary | ICD-10-CM | POA: Diagnosis not present

## 2019-11-28 DIAGNOSIS — R0902 Hypoxemia: Secondary | ICD-10-CM | POA: Diagnosis not present

## 2019-11-28 DIAGNOSIS — J45998 Other asthma: Secondary | ICD-10-CM | POA: Diagnosis not present

## 2019-11-28 DIAGNOSIS — I10 Essential (primary) hypertension: Secondary | ICD-10-CM | POA: Diagnosis not present

## 2019-11-28 DIAGNOSIS — U071 COVID-19: Secondary | ICD-10-CM | POA: Diagnosis not present

## 2019-11-28 DIAGNOSIS — R4020 Unspecified coma: Secondary | ICD-10-CM | POA: Diagnosis not present

## 2019-11-28 DIAGNOSIS — F329 Major depressive disorder, single episode, unspecified: Secondary | ICD-10-CM | POA: Diagnosis not present

## 2019-11-28 DIAGNOSIS — R8290 Unspecified abnormal findings in urine: Secondary | ICD-10-CM | POA: Diagnosis not present

## 2019-11-28 DIAGNOSIS — Z20822 Contact with and (suspected) exposure to covid-19: Secondary | ICD-10-CM | POA: Diagnosis not present

## 2019-11-28 DIAGNOSIS — E781 Pure hyperglyceridemia: Secondary | ICD-10-CM | POA: Diagnosis not present

## 2019-11-29 DIAGNOSIS — K760 Fatty (change of) liver, not elsewhere classified: Secondary | ICD-10-CM | POA: Diagnosis not present

## 2019-11-29 DIAGNOSIS — R0602 Shortness of breath: Secondary | ICD-10-CM | POA: Diagnosis not present

## 2019-11-29 DIAGNOSIS — D519 Vitamin B12 deficiency anemia, unspecified: Secondary | ICD-10-CM | POA: Diagnosis not present

## 2019-11-29 DIAGNOSIS — Z905 Acquired absence of kidney: Secondary | ICD-10-CM | POA: Diagnosis not present

## 2019-11-29 DIAGNOSIS — M5440 Lumbago with sciatica, unspecified side: Secondary | ICD-10-CM | POA: Diagnosis not present

## 2019-11-29 DIAGNOSIS — J449 Chronic obstructive pulmonary disease, unspecified: Secondary | ICD-10-CM | POA: Diagnosis not present

## 2019-11-29 DIAGNOSIS — I5022 Chronic systolic (congestive) heart failure: Secondary | ICD-10-CM | POA: Diagnosis not present

## 2019-11-29 DIAGNOSIS — R42 Dizziness and giddiness: Secondary | ICD-10-CM | POA: Diagnosis not present

## 2019-11-29 DIAGNOSIS — Z125 Encounter for screening for malignant neoplasm of prostate: Secondary | ICD-10-CM | POA: Diagnosis not present

## 2019-11-29 DIAGNOSIS — Z8679 Personal history of other diseases of the circulatory system: Secondary | ICD-10-CM | POA: Diagnosis not present

## 2019-11-29 DIAGNOSIS — I25119 Atherosclerotic heart disease of native coronary artery with unspecified angina pectoris: Secondary | ICD-10-CM | POA: Diagnosis not present

## 2019-11-29 DIAGNOSIS — J159 Unspecified bacterial pneumonia: Secondary | ICD-10-CM | POA: Diagnosis not present

## 2019-11-29 DIAGNOSIS — E209 Hypoparathyroidism, unspecified: Secondary | ICD-10-CM | POA: Diagnosis not present

## 2019-11-29 DIAGNOSIS — Z5181 Encounter for therapeutic drug level monitoring: Secondary | ICD-10-CM | POA: Diagnosis not present

## 2019-11-29 DIAGNOSIS — C61 Malignant neoplasm of prostate: Secondary | ICD-10-CM | POA: Diagnosis not present

## 2019-11-29 DIAGNOSIS — Z981 Arthrodesis status: Secondary | ICD-10-CM | POA: Diagnosis not present

## 2019-11-29 DIAGNOSIS — R310 Gross hematuria: Secondary | ICD-10-CM | POA: Diagnosis not present

## 2019-11-29 DIAGNOSIS — Z0001 Encounter for general adult medical examination with abnormal findings: Secondary | ICD-10-CM | POA: Diagnosis not present

## 2019-11-29 DIAGNOSIS — R2681 Unsteadiness on feet: Secondary | ICD-10-CM | POA: Diagnosis not present

## 2019-11-29 DIAGNOSIS — Z6827 Body mass index (BMI) 27.0-27.9, adult: Secondary | ICD-10-CM | POA: Diagnosis not present

## 2019-11-29 DIAGNOSIS — R82998 Other abnormal findings in urine: Secondary | ICD-10-CM | POA: Diagnosis not present

## 2019-11-29 DIAGNOSIS — U071 COVID-19: Secondary | ICD-10-CM | POA: Diagnosis not present

## 2019-11-29 DIAGNOSIS — I358 Other nonrheumatic aortic valve disorders: Secondary | ICD-10-CM | POA: Diagnosis not present

## 2019-11-29 DIAGNOSIS — Z882 Allergy status to sulfonamides status: Secondary | ICD-10-CM | POA: Diagnosis not present

## 2019-11-29 DIAGNOSIS — G930 Cerebral cysts: Secondary | ICD-10-CM | POA: Diagnosis not present

## 2019-11-29 DIAGNOSIS — H40011 Open angle with borderline findings, low risk, right eye: Secondary | ICD-10-CM | POA: Diagnosis not present

## 2019-11-29 DIAGNOSIS — M47816 Spondylosis without myelopathy or radiculopathy, lumbar region: Secondary | ICD-10-CM | POA: Diagnosis not present

## 2019-11-29 DIAGNOSIS — F17210 Nicotine dependence, cigarettes, uncomplicated: Secondary | ICD-10-CM | POA: Diagnosis not present

## 2019-11-29 DIAGNOSIS — N1832 Chronic kidney disease, stage 3b: Secondary | ICD-10-CM | POA: Diagnosis not present

## 2019-11-29 DIAGNOSIS — E785 Hyperlipidemia, unspecified: Secondary | ICD-10-CM | POA: Diagnosis not present

## 2019-11-29 DIAGNOSIS — E039 Hypothyroidism, unspecified: Secondary | ICD-10-CM | POA: Diagnosis not present

## 2019-11-29 DIAGNOSIS — N390 Urinary tract infection, site not specified: Secondary | ICD-10-CM | POA: Diagnosis not present

## 2019-11-29 DIAGNOSIS — E538 Deficiency of other specified B group vitamins: Secondary | ICD-10-CM | POA: Diagnosis not present

## 2019-11-29 DIAGNOSIS — Z124 Encounter for screening for malignant neoplasm of cervix: Secondary | ICD-10-CM | POA: Diagnosis not present

## 2019-11-29 DIAGNOSIS — E509 Vitamin A deficiency, unspecified: Secondary | ICD-10-CM | POA: Diagnosis not present

## 2019-11-29 DIAGNOSIS — R05 Cough: Secondary | ICD-10-CM | POA: Diagnosis not present

## 2019-11-29 DIAGNOSIS — Z951 Presence of aortocoronary bypass graft: Secondary | ICD-10-CM | POA: Diagnosis not present

## 2019-11-29 DIAGNOSIS — F419 Anxiety disorder, unspecified: Secondary | ICD-10-CM | POA: Diagnosis not present

## 2019-11-29 DIAGNOSIS — E569 Vitamin deficiency, unspecified: Secondary | ICD-10-CM | POA: Diagnosis not present

## 2019-11-29 DIAGNOSIS — I9589 Other hypotension: Secondary | ICD-10-CM | POA: Diagnosis not present

## 2019-11-29 DIAGNOSIS — N201 Calculus of ureter: Secondary | ICD-10-CM | POA: Diagnosis not present

## 2019-11-29 DIAGNOSIS — C7931 Secondary malignant neoplasm of brain: Secondary | ICD-10-CM | POA: Diagnosis not present

## 2019-11-29 DIAGNOSIS — N3090 Cystitis, unspecified without hematuria: Secondary | ICD-10-CM | POA: Diagnosis not present

## 2019-11-29 DIAGNOSIS — N3001 Acute cystitis with hematuria: Secondary | ICD-10-CM | POA: Diagnosis not present

## 2019-11-29 DIAGNOSIS — M5137 Other intervertebral disc degeneration, lumbosacral region: Secondary | ICD-10-CM | POA: Diagnosis not present

## 2019-11-29 DIAGNOSIS — Z7689 Persons encountering health services in other specified circumstances: Secondary | ICD-10-CM | POA: Diagnosis not present

## 2019-11-29 DIAGNOSIS — M4620 Osteomyelitis of vertebra, site unspecified: Secondary | ICD-10-CM | POA: Diagnosis not present

## 2019-11-29 DIAGNOSIS — M5136 Other intervertebral disc degeneration, lumbar region: Secondary | ICD-10-CM | POA: Diagnosis not present

## 2019-11-29 DIAGNOSIS — M1990 Unspecified osteoarthritis, unspecified site: Secondary | ICD-10-CM | POA: Diagnosis not present

## 2019-11-29 DIAGNOSIS — R0902 Hypoxemia: Secondary | ICD-10-CM | POA: Diagnosis not present

## 2019-11-29 DIAGNOSIS — N3000 Acute cystitis without hematuria: Secondary | ICD-10-CM | POA: Diagnosis not present

## 2019-11-29 DIAGNOSIS — G936 Cerebral edema: Secondary | ICD-10-CM | POA: Diagnosis not present

## 2019-11-29 DIAGNOSIS — M2550 Pain in unspecified joint: Secondary | ICD-10-CM | POA: Diagnosis not present

## 2019-11-29 DIAGNOSIS — E063 Autoimmune thyroiditis: Secondary | ICD-10-CM | POA: Diagnosis not present

## 2019-11-29 DIAGNOSIS — R8290 Unspecified abnormal findings in urine: Secondary | ICD-10-CM | POA: Diagnosis not present

## 2019-11-29 DIAGNOSIS — J80 Acute respiratory distress syndrome: Secondary | ICD-10-CM | POA: Diagnosis not present

## 2019-11-29 DIAGNOSIS — R509 Fever, unspecified: Secondary | ICD-10-CM | POA: Diagnosis not present

## 2019-11-29 DIAGNOSIS — E669 Obesity, unspecified: Secondary | ICD-10-CM | POA: Diagnosis not present

## 2019-11-29 DIAGNOSIS — M5020 Other cervical disc displacement, unspecified cervical region: Secondary | ICD-10-CM | POA: Diagnosis not present

## 2019-11-29 DIAGNOSIS — C7800 Secondary malignant neoplasm of unspecified lung: Secondary | ICD-10-CM | POA: Diagnosis not present

## 2019-11-29 DIAGNOSIS — D696 Thrombocytopenia, unspecified: Secondary | ICD-10-CM | POA: Diagnosis not present

## 2019-11-29 DIAGNOSIS — J939 Pneumothorax, unspecified: Secondary | ICD-10-CM | POA: Diagnosis not present

## 2019-11-29 DIAGNOSIS — D126 Benign neoplasm of colon, unspecified: Secondary | ICD-10-CM | POA: Diagnosis not present

## 2019-11-29 DIAGNOSIS — D518 Other vitamin B12 deficiency anemias: Secondary | ICD-10-CM | POA: Diagnosis not present

## 2019-11-29 DIAGNOSIS — L97522 Non-pressure chronic ulcer of other part of left foot with fat layer exposed: Secondary | ICD-10-CM | POA: Diagnosis not present

## 2019-11-29 DIAGNOSIS — E1121 Type 2 diabetes mellitus with diabetic nephropathy: Secondary | ICD-10-CM | POA: Diagnosis not present

## 2019-11-29 DIAGNOSIS — Z96651 Presence of right artificial knee joint: Secondary | ICD-10-CM | POA: Diagnosis not present

## 2019-11-29 DIAGNOSIS — E162 Hypoglycemia, unspecified: Secondary | ICD-10-CM | POA: Diagnosis not present

## 2019-11-29 DIAGNOSIS — I472 Ventricular tachycardia: Secondary | ICD-10-CM | POA: Diagnosis not present

## 2019-11-29 DIAGNOSIS — E872 Acidosis: Secondary | ICD-10-CM | POA: Diagnosis not present

## 2019-11-29 DIAGNOSIS — D7589 Other specified diseases of blood and blood-forming organs: Secondary | ICD-10-CM | POA: Diagnosis not present

## 2019-11-29 DIAGNOSIS — Z515 Encounter for palliative care: Secondary | ICD-10-CM | POA: Diagnosis not present

## 2019-11-29 DIAGNOSIS — F10239 Alcohol dependence with withdrawal, unspecified: Secondary | ICD-10-CM | POA: Diagnosis not present

## 2019-11-29 DIAGNOSIS — E1140 Type 2 diabetes mellitus with diabetic neuropathy, unspecified: Secondary | ICD-10-CM | POA: Diagnosis not present

## 2019-11-29 DIAGNOSIS — Z8739 Personal history of other diseases of the musculoskeletal system and connective tissue: Secondary | ICD-10-CM | POA: Diagnosis not present

## 2019-11-29 DIAGNOSIS — K582 Mixed irritable bowel syndrome: Secondary | ICD-10-CM | POA: Diagnosis not present

## 2019-11-29 DIAGNOSIS — D649 Anemia, unspecified: Secondary | ICD-10-CM | POA: Diagnosis not present

## 2019-11-29 DIAGNOSIS — G894 Chronic pain syndrome: Secondary | ICD-10-CM | POA: Diagnosis not present

## 2019-11-29 DIAGNOSIS — R2689 Other abnormalities of gait and mobility: Secondary | ICD-10-CM | POA: Diagnosis not present

## 2019-11-29 DIAGNOSIS — Z888 Allergy status to other drugs, medicaments and biological substances status: Secondary | ICD-10-CM | POA: Diagnosis not present

## 2019-11-29 DIAGNOSIS — R7303 Prediabetes: Secondary | ICD-10-CM | POA: Diagnosis not present

## 2019-11-29 DIAGNOSIS — K5080 Crohn's disease of both small and large intestine without complications: Secondary | ICD-10-CM | POA: Diagnosis not present

## 2019-11-29 DIAGNOSIS — E781 Pure hyperglyceridemia: Secondary | ICD-10-CM | POA: Diagnosis not present

## 2019-11-29 DIAGNOSIS — E119 Type 2 diabetes mellitus without complications: Secondary | ICD-10-CM | POA: Diagnosis not present

## 2019-11-29 DIAGNOSIS — E7212 Methylenetetrahydrofolate reductase deficiency: Secondary | ICD-10-CM | POA: Diagnosis not present

## 2019-11-29 DIAGNOSIS — F329 Major depressive disorder, single episode, unspecified: Secondary | ICD-10-CM | POA: Diagnosis not present

## 2019-11-29 DIAGNOSIS — E279 Disorder of adrenal gland, unspecified: Secondary | ICD-10-CM | POA: Diagnosis not present

## 2019-11-29 DIAGNOSIS — F1121 Opioid dependence, in remission: Secondary | ICD-10-CM | POA: Diagnosis not present

## 2019-11-29 DIAGNOSIS — G5601 Carpal tunnel syndrome, right upper limb: Secondary | ICD-10-CM | POA: Diagnosis not present

## 2019-11-29 DIAGNOSIS — N400 Enlarged prostate without lower urinary tract symptoms: Secondary | ICD-10-CM | POA: Diagnosis not present

## 2019-11-29 DIAGNOSIS — E1142 Type 2 diabetes mellitus with diabetic polyneuropathy: Secondary | ICD-10-CM | POA: Diagnosis not present

## 2019-11-29 DIAGNOSIS — M5134 Other intervertebral disc degeneration, thoracic region: Secondary | ICD-10-CM | POA: Diagnosis not present

## 2019-11-29 DIAGNOSIS — R87610 Atypical squamous cells of undetermined significance on cytologic smear of cervix (ASC-US): Secondary | ICD-10-CM | POA: Diagnosis not present

## 2019-11-29 DIAGNOSIS — J029 Acute pharyngitis, unspecified: Secondary | ICD-10-CM | POA: Diagnosis not present

## 2019-11-29 DIAGNOSIS — Q245 Malformation of coronary vessels: Secondary | ICD-10-CM | POA: Diagnosis not present

## 2019-11-29 DIAGNOSIS — R9721 Rising PSA following treatment for malignant neoplasm of prostate: Secondary | ICD-10-CM | POA: Diagnosis not present

## 2019-11-29 DIAGNOSIS — E559 Vitamin D deficiency, unspecified: Secondary | ICD-10-CM | POA: Diagnosis not present

## 2019-11-29 DIAGNOSIS — R972 Elevated prostate specific antigen [PSA]: Secondary | ICD-10-CM | POA: Diagnosis not present

## 2019-11-29 DIAGNOSIS — Z03818 Encounter for observation for suspected exposure to other biological agents ruled out: Secondary | ICD-10-CM | POA: Diagnosis not present

## 2019-11-29 DIAGNOSIS — N170 Acute kidney failure with tubular necrosis: Secondary | ICD-10-CM | POA: Diagnosis not present

## 2019-11-29 DIAGNOSIS — H43811 Vitreous degeneration, right eye: Secondary | ICD-10-CM | POA: Diagnosis not present

## 2019-11-29 DIAGNOSIS — M7918 Myalgia, other site: Secondary | ICD-10-CM | POA: Diagnosis not present

## 2019-11-29 DIAGNOSIS — Z113 Encounter for screening for infections with a predominantly sexual mode of transmission: Secondary | ICD-10-CM | POA: Diagnosis not present

## 2019-11-29 DIAGNOSIS — M47814 Spondylosis without myelopathy or radiculopathy, thoracic region: Secondary | ICD-10-CM | POA: Diagnosis not present

## 2019-11-29 DIAGNOSIS — Z87891 Personal history of nicotine dependence: Secondary | ICD-10-CM | POA: Diagnosis not present

## 2019-11-29 DIAGNOSIS — Z7984 Long term (current) use of oral hypoglycemic drugs: Secondary | ICD-10-CM | POA: Diagnosis not present

## 2019-11-29 DIAGNOSIS — R600 Localized edema: Secondary | ICD-10-CM | POA: Diagnosis not present

## 2019-11-29 DIAGNOSIS — E7800 Pure hypercholesterolemia, unspecified: Secondary | ICD-10-CM | POA: Diagnosis not present

## 2019-11-29 DIAGNOSIS — I2510 Atherosclerotic heart disease of native coronary artery without angina pectoris: Secondary | ICD-10-CM | POA: Diagnosis not present

## 2019-11-29 DIAGNOSIS — I480 Paroxysmal atrial fibrillation: Secondary | ICD-10-CM | POA: Diagnosis not present

## 2019-11-29 DIAGNOSIS — E1122 Type 2 diabetes mellitus with diabetic chronic kidney disease: Secondary | ICD-10-CM | POA: Diagnosis not present

## 2019-11-29 DIAGNOSIS — D61818 Other pancytopenia: Secondary | ICD-10-CM | POA: Diagnosis not present

## 2019-11-29 DIAGNOSIS — G959 Disease of spinal cord, unspecified: Secondary | ICD-10-CM | POA: Diagnosis not present

## 2019-11-29 DIAGNOSIS — R5382 Chronic fatigue, unspecified: Secondary | ICD-10-CM | POA: Diagnosis not present

## 2019-11-29 DIAGNOSIS — N401 Enlarged prostate with lower urinary tract symptoms: Secondary | ICD-10-CM | POA: Diagnosis not present

## 2019-11-29 DIAGNOSIS — M5412 Radiculopathy, cervical region: Secondary | ICD-10-CM | POA: Diagnosis not present

## 2019-11-29 DIAGNOSIS — Z79891 Long term (current) use of opiate analgesic: Secondary | ICD-10-CM | POA: Diagnosis not present

## 2019-11-29 DIAGNOSIS — Z9181 History of falling: Secondary | ICD-10-CM | POA: Diagnosis not present

## 2019-11-29 DIAGNOSIS — R350 Frequency of micturition: Secondary | ICD-10-CM | POA: Diagnosis not present

## 2019-11-29 DIAGNOSIS — I119 Hypertensive heart disease without heart failure: Secondary | ICD-10-CM | POA: Diagnosis not present

## 2019-11-29 DIAGNOSIS — G40909 Epilepsy, unspecified, not intractable, without status epilepticus: Secondary | ICD-10-CM | POA: Diagnosis not present

## 2019-11-29 DIAGNOSIS — Z79899 Other long term (current) drug therapy: Secondary | ICD-10-CM | POA: Diagnosis not present

## 2019-11-29 DIAGNOSIS — J1282 Pneumonia due to coronavirus disease 2019: Secondary | ICD-10-CM | POA: Diagnosis not present

## 2019-11-29 DIAGNOSIS — E11649 Type 2 diabetes mellitus with hypoglycemia without coma: Secondary | ICD-10-CM | POA: Diagnosis not present

## 2019-11-29 DIAGNOSIS — R413 Other amnesia: Secondary | ICD-10-CM | POA: Diagnosis not present

## 2019-11-29 DIAGNOSIS — I471 Supraventricular tachycardia: Secondary | ICD-10-CM | POA: Diagnosis not present

## 2019-11-29 DIAGNOSIS — R7301 Impaired fasting glucose: Secondary | ICD-10-CM | POA: Diagnosis not present

## 2019-11-29 DIAGNOSIS — G92 Toxic encephalopathy: Secondary | ICD-10-CM | POA: Diagnosis not present

## 2019-11-29 DIAGNOSIS — N138 Other obstructive and reflux uropathy: Secondary | ICD-10-CM | POA: Diagnosis not present

## 2019-11-29 DIAGNOSIS — R799 Abnormal finding of blood chemistry, unspecified: Secondary | ICD-10-CM | POA: Diagnosis not present

## 2019-11-29 DIAGNOSIS — A4189 Other specified sepsis: Secondary | ICD-10-CM | POA: Diagnosis not present

## 2019-11-29 DIAGNOSIS — D509 Iron deficiency anemia, unspecified: Secondary | ICD-10-CM | POA: Diagnosis not present

## 2019-11-29 DIAGNOSIS — M50123 Cervical disc disorder at C6-C7 level with radiculopathy: Secondary | ICD-10-CM | POA: Diagnosis not present

## 2019-11-29 DIAGNOSIS — Z13220 Encounter for screening for lipoid disorders: Secondary | ICD-10-CM | POA: Diagnosis not present

## 2019-11-29 DIAGNOSIS — Z87442 Personal history of urinary calculi: Secondary | ICD-10-CM | POA: Diagnosis not present

## 2019-11-29 DIAGNOSIS — C50412 Malignant neoplasm of upper-outer quadrant of left female breast: Secondary | ICD-10-CM | POA: Diagnosis not present

## 2019-11-29 DIAGNOSIS — M109 Gout, unspecified: Secondary | ICD-10-CM | POA: Diagnosis not present

## 2019-11-29 DIAGNOSIS — L97521 Non-pressure chronic ulcer of other part of left foot limited to breakdown of skin: Secondary | ICD-10-CM | POA: Diagnosis not present

## 2019-11-29 DIAGNOSIS — Z85528 Personal history of other malignant neoplasm of kidney: Secondary | ICD-10-CM | POA: Diagnosis not present

## 2019-11-29 DIAGNOSIS — R8271 Bacteriuria: Secondary | ICD-10-CM | POA: Diagnosis not present

## 2019-11-29 DIAGNOSIS — M6281 Muscle weakness (generalized): Secondary | ICD-10-CM | POA: Diagnosis not present

## 2019-11-29 DIAGNOSIS — Z1211 Encounter for screening for malignant neoplasm of colon: Secondary | ICD-10-CM | POA: Diagnosis not present

## 2019-11-29 DIAGNOSIS — N3020 Other chronic cystitis without hematuria: Secondary | ICD-10-CM | POA: Diagnosis not present

## 2019-11-29 DIAGNOSIS — R7982 Elevated C-reactive protein (CRP): Secondary | ICD-10-CM | POA: Diagnosis not present

## 2019-11-29 DIAGNOSIS — Z95810 Presence of automatic (implantable) cardiac defibrillator: Secondary | ICD-10-CM | POA: Diagnosis not present

## 2019-11-29 DIAGNOSIS — I70211 Atherosclerosis of native arteries of extremities with intermittent claudication, right leg: Secondary | ICD-10-CM | POA: Diagnosis not present

## 2019-11-29 DIAGNOSIS — Z6821 Body mass index (BMI) 21.0-21.9, adult: Secondary | ICD-10-CM | POA: Diagnosis not present

## 2019-11-29 DIAGNOSIS — Z20822 Contact with and (suspected) exposure to covid-19: Secondary | ICD-10-CM | POA: Diagnosis not present

## 2019-11-29 DIAGNOSIS — N1831 Chronic kidney disease, stage 3a: Secondary | ICD-10-CM | POA: Diagnosis not present

## 2019-11-29 DIAGNOSIS — Z66 Do not resuscitate: Secondary | ICD-10-CM | POA: Diagnosis not present

## 2019-11-29 DIAGNOSIS — D538 Other specified nutritional anemias: Secondary | ICD-10-CM | POA: Diagnosis not present

## 2019-11-29 DIAGNOSIS — Z9189 Other specified personal risk factors, not elsewhere classified: Secondary | ICD-10-CM | POA: Diagnosis not present

## 2019-11-29 DIAGNOSIS — N5201 Erectile dysfunction due to arterial insufficiency: Secondary | ICD-10-CM | POA: Diagnosis not present

## 2019-11-29 DIAGNOSIS — Z9889 Other specified postprocedural states: Secondary | ICD-10-CM | POA: Diagnosis not present

## 2019-11-29 DIAGNOSIS — G2581 Restless legs syndrome: Secondary | ICD-10-CM | POA: Diagnosis not present

## 2019-11-29 DIAGNOSIS — K219 Gastro-esophageal reflux disease without esophagitis: Secondary | ICD-10-CM | POA: Diagnosis not present

## 2019-11-29 DIAGNOSIS — I739 Peripheral vascular disease, unspecified: Secondary | ICD-10-CM | POA: Diagnosis not present

## 2019-11-29 DIAGNOSIS — G250 Essential tremor: Secondary | ICD-10-CM | POA: Diagnosis not present

## 2019-11-29 DIAGNOSIS — R5383 Other fatigue: Secondary | ICD-10-CM | POA: Diagnosis not present

## 2019-11-29 DIAGNOSIS — I129 Hypertensive chronic kidney disease with stage 1 through stage 4 chronic kidney disease, or unspecified chronic kidney disease: Secondary | ICD-10-CM | POA: Diagnosis not present

## 2019-11-29 DIAGNOSIS — Z8249 Family history of ischemic heart disease and other diseases of the circulatory system: Secondary | ICD-10-CM | POA: Diagnosis not present

## 2019-11-29 DIAGNOSIS — I130 Hypertensive heart and chronic kidney disease with heart failure and stage 1 through stage 4 chronic kidney disease, or unspecified chronic kidney disease: Secondary | ICD-10-CM | POA: Diagnosis not present

## 2019-11-29 DIAGNOSIS — Z6828 Body mass index (BMI) 28.0-28.9, adult: Secondary | ICD-10-CM | POA: Diagnosis not present

## 2019-11-29 DIAGNOSIS — E663 Overweight: Secondary | ICD-10-CM | POA: Diagnosis not present

## 2019-11-29 DIAGNOSIS — G5621 Lesion of ulnar nerve, right upper limb: Secondary | ICD-10-CM | POA: Diagnosis not present

## 2019-11-29 DIAGNOSIS — E539 Vitamin B deficiency, unspecified: Secondary | ICD-10-CM | POA: Diagnosis not present

## 2019-11-29 DIAGNOSIS — R293 Abnormal posture: Secondary | ICD-10-CM | POA: Diagnosis not present

## 2019-11-29 DIAGNOSIS — I6381 Other cerebral infarction due to occlusion or stenosis of small artery: Secondary | ICD-10-CM | POA: Diagnosis not present

## 2019-11-29 DIAGNOSIS — F1010 Alcohol abuse, uncomplicated: Secondary | ICD-10-CM | POA: Diagnosis not present

## 2019-11-29 DIAGNOSIS — R1030 Lower abdominal pain, unspecified: Secondary | ICD-10-CM | POA: Diagnosis not present

## 2019-11-29 DIAGNOSIS — Z720 Tobacco use: Secondary | ICD-10-CM | POA: Diagnosis not present

## 2019-11-29 DIAGNOSIS — E1165 Type 2 diabetes mellitus with hyperglycemia: Secondary | ICD-10-CM | POA: Diagnosis not present

## 2019-11-29 DIAGNOSIS — Z20828 Contact with and (suspected) exposure to other viral communicable diseases: Secondary | ICD-10-CM | POA: Diagnosis not present

## 2019-11-29 DIAGNOSIS — I4891 Unspecified atrial fibrillation: Secondary | ICD-10-CM | POA: Diagnosis not present

## 2019-11-29 DIAGNOSIS — I255 Ischemic cardiomyopathy: Secondary | ICD-10-CM | POA: Diagnosis not present

## 2019-11-29 DIAGNOSIS — K743 Primary biliary cirrhosis: Secondary | ICD-10-CM | POA: Diagnosis not present

## 2019-11-29 DIAGNOSIS — N2889 Other specified disorders of kidney and ureter: Secondary | ICD-10-CM | POA: Diagnosis not present

## 2019-11-29 DIAGNOSIS — E782 Mixed hyperlipidemia: Secondary | ICD-10-CM | POA: Diagnosis not present

## 2019-11-29 DIAGNOSIS — H25813 Combined forms of age-related cataract, bilateral: Secondary | ICD-10-CM | POA: Diagnosis not present

## 2019-11-29 DIAGNOSIS — Z8673 Personal history of transient ischemic attack (TIA), and cerebral infarction without residual deficits: Secondary | ICD-10-CM | POA: Diagnosis not present

## 2019-11-29 DIAGNOSIS — E118 Type 2 diabetes mellitus with unspecified complications: Secondary | ICD-10-CM | POA: Diagnosis not present

## 2019-11-29 DIAGNOSIS — R011 Cardiac murmur, unspecified: Secondary | ICD-10-CM | POA: Diagnosis not present

## 2019-11-29 DIAGNOSIS — M21622 Bunionette of left foot: Secondary | ICD-10-CM | POA: Diagnosis not present

## 2019-11-29 DIAGNOSIS — R339 Retention of urine, unspecified: Secondary | ICD-10-CM | POA: Diagnosis not present

## 2019-11-29 DIAGNOSIS — R100 Acute abdomen: Secondary | ICD-10-CM | POA: Diagnosis not present

## 2019-11-29 DIAGNOSIS — M48061 Spinal stenosis, lumbar region without neurogenic claudication: Secondary | ICD-10-CM | POA: Diagnosis not present

## 2019-11-29 DIAGNOSIS — R7989 Other specified abnormal findings of blood chemistry: Secondary | ICD-10-CM | POA: Diagnosis not present

## 2019-11-29 DIAGNOSIS — F4310 Post-traumatic stress disorder, unspecified: Secondary | ICD-10-CM | POA: Diagnosis not present

## 2019-11-29 DIAGNOSIS — E0590 Thyrotoxicosis, unspecified without thyrotoxic crisis or storm: Secondary | ICD-10-CM | POA: Diagnosis not present

## 2019-11-29 DIAGNOSIS — S72001S Fracture of unspecified part of neck of right femur, sequela: Secondary | ICD-10-CM | POA: Diagnosis not present

## 2019-11-29 DIAGNOSIS — G931 Anoxic brain damage, not elsewhere classified: Secondary | ICD-10-CM | POA: Diagnosis not present

## 2019-11-29 DIAGNOSIS — Z1389 Encounter for screening for other disorder: Secondary | ICD-10-CM | POA: Diagnosis not present

## 2019-11-29 DIAGNOSIS — Z1159 Encounter for screening for other viral diseases: Secondary | ICD-10-CM | POA: Diagnosis not present

## 2019-11-29 DIAGNOSIS — R296 Repeated falls: Secondary | ICD-10-CM | POA: Diagnosis not present

## 2019-11-29 DIAGNOSIS — Z1152 Encounter for screening for COVID-19: Secondary | ICD-10-CM | POA: Diagnosis not present

## 2019-11-29 DIAGNOSIS — D071 Carcinoma in situ of vulva: Secondary | ICD-10-CM | POA: Diagnosis not present

## 2019-11-29 DIAGNOSIS — Z01812 Encounter for preprocedural laboratory examination: Secondary | ICD-10-CM | POA: Diagnosis not present

## 2019-11-29 DIAGNOSIS — E860 Dehydration: Secondary | ICD-10-CM | POA: Diagnosis not present

## 2019-11-29 DIAGNOSIS — Z96612 Presence of left artificial shoulder joint: Secondary | ICD-10-CM | POA: Diagnosis not present

## 2019-11-29 DIAGNOSIS — R319 Hematuria, unspecified: Secondary | ICD-10-CM | POA: Diagnosis not present

## 2019-11-29 DIAGNOSIS — Z0000 Encounter for general adult medical examination without abnormal findings: Secondary | ICD-10-CM | POA: Diagnosis not present

## 2019-11-29 DIAGNOSIS — M545 Low back pain: Secondary | ICD-10-CM | POA: Diagnosis not present

## 2019-11-29 DIAGNOSIS — I10 Essential (primary) hypertension: Secondary | ICD-10-CM | POA: Diagnosis not present

## 2019-11-29 DIAGNOSIS — Z9049 Acquired absence of other specified parts of digestive tract: Secondary | ICD-10-CM | POA: Diagnosis not present

## 2019-11-29 DIAGNOSIS — B351 Tinea unguium: Secondary | ICD-10-CM | POA: Diagnosis not present

## 2019-11-29 DIAGNOSIS — N951 Menopausal and female climacteric states: Secondary | ICD-10-CM | POA: Diagnosis not present

## 2019-11-29 DIAGNOSIS — M810 Age-related osteoporosis without current pathological fracture: Secondary | ICD-10-CM | POA: Diagnosis not present

## 2019-11-30 DIAGNOSIS — E1165 Type 2 diabetes mellitus with hyperglycemia: Secondary | ICD-10-CM | POA: Diagnosis not present

## 2019-11-30 DIAGNOSIS — R070 Pain in throat: Secondary | ICD-10-CM | POA: Diagnosis not present

## 2019-11-30 DIAGNOSIS — Z6838 Body mass index (BMI) 38.0-38.9, adult: Secondary | ICD-10-CM | POA: Diagnosis not present

## 2019-11-30 DIAGNOSIS — G459 Transient cerebral ischemic attack, unspecified: Secondary | ICD-10-CM | POA: Diagnosis not present

## 2019-11-30 DIAGNOSIS — R05 Cough: Secondary | ICD-10-CM | POA: Diagnosis not present

## 2019-11-30 DIAGNOSIS — I4891 Unspecified atrial fibrillation: Secondary | ICD-10-CM | POA: Diagnosis not present

## 2019-11-30 DIAGNOSIS — I472 Ventricular tachycardia: Secondary | ICD-10-CM | POA: Diagnosis not present

## 2019-11-30 DIAGNOSIS — N183 Chronic kidney disease, stage 3 unspecified: Secondary | ICD-10-CM | POA: Diagnosis not present

## 2019-11-30 DIAGNOSIS — I10 Essential (primary) hypertension: Secondary | ICD-10-CM | POA: Diagnosis not present

## 2019-11-30 DIAGNOSIS — Z131 Encounter for screening for diabetes mellitus: Secondary | ICD-10-CM | POA: Diagnosis not present

## 2019-11-30 DIAGNOSIS — J9600 Acute respiratory failure, unspecified whether with hypoxia or hypercapnia: Secondary | ICD-10-CM | POA: Diagnosis not present

## 2019-11-30 DIAGNOSIS — R509 Fever, unspecified: Secondary | ICD-10-CM | POA: Diagnosis not present

## 2019-11-30 DIAGNOSIS — I5032 Chronic diastolic (congestive) heart failure: Secondary | ICD-10-CM | POA: Diagnosis not present

## 2019-11-30 DIAGNOSIS — M797 Fibromyalgia: Secondary | ICD-10-CM | POA: Diagnosis not present

## 2019-11-30 DIAGNOSIS — Z20828 Contact with and (suspected) exposure to other viral communicable diseases: Secondary | ICD-10-CM | POA: Diagnosis not present

## 2019-11-30 DIAGNOSIS — M25559 Pain in unspecified hip: Secondary | ICD-10-CM | POA: Diagnosis not present

## 2019-11-30 DIAGNOSIS — E1122 Type 2 diabetes mellitus with diabetic chronic kidney disease: Secondary | ICD-10-CM | POA: Diagnosis not present

## 2019-11-30 DIAGNOSIS — L720 Epidermal cyst: Secondary | ICD-10-CM | POA: Diagnosis not present

## 2019-11-30 DIAGNOSIS — Z741 Need for assistance with personal care: Secondary | ICD-10-CM | POA: Diagnosis not present

## 2019-11-30 DIAGNOSIS — M359 Systemic involvement of connective tissue, unspecified: Secondary | ICD-10-CM | POA: Diagnosis not present

## 2019-11-30 DIAGNOSIS — Z79899 Other long term (current) drug therapy: Secondary | ICD-10-CM | POA: Diagnosis not present

## 2019-11-30 DIAGNOSIS — E1169 Type 2 diabetes mellitus with other specified complication: Secondary | ICD-10-CM | POA: Diagnosis not present

## 2019-11-30 DIAGNOSIS — Z1389 Encounter for screening for other disorder: Secondary | ICD-10-CM | POA: Diagnosis not present

## 2019-11-30 DIAGNOSIS — E7849 Other hyperlipidemia: Secondary | ICD-10-CM | POA: Diagnosis not present

## 2019-11-30 DIAGNOSIS — K625 Hemorrhage of anus and rectum: Secondary | ICD-10-CM | POA: Diagnosis not present

## 2019-11-30 DIAGNOSIS — Z125 Encounter for screening for malignant neoplasm of prostate: Secondary | ICD-10-CM | POA: Diagnosis not present

## 2019-11-30 DIAGNOSIS — R1310 Dysphagia, unspecified: Secondary | ICD-10-CM | POA: Diagnosis not present

## 2019-11-30 DIAGNOSIS — R062 Wheezing: Secondary | ICD-10-CM | POA: Diagnosis not present

## 2019-11-30 DIAGNOSIS — M353 Polymyalgia rheumatica: Secondary | ICD-10-CM | POA: Diagnosis not present

## 2019-11-30 DIAGNOSIS — Z953 Presence of xenogenic heart valve: Secondary | ICD-10-CM | POA: Diagnosis not present

## 2019-11-30 DIAGNOSIS — R7303 Prediabetes: Secondary | ICD-10-CM | POA: Diagnosis not present

## 2019-11-30 DIAGNOSIS — I129 Hypertensive chronic kidney disease with stage 1 through stage 4 chronic kidney disease, or unspecified chronic kidney disease: Secondary | ICD-10-CM | POA: Diagnosis not present

## 2019-11-30 DIAGNOSIS — E559 Vitamin D deficiency, unspecified: Secondary | ICD-10-CM | POA: Diagnosis not present

## 2019-11-30 DIAGNOSIS — R5383 Other fatigue: Secondary | ICD-10-CM | POA: Diagnosis not present

## 2019-11-30 DIAGNOSIS — Z7952 Long term (current) use of systemic steroids: Secondary | ICD-10-CM | POA: Diagnosis not present

## 2019-11-30 DIAGNOSIS — Z1211 Encounter for screening for malignant neoplasm of colon: Secondary | ICD-10-CM | POA: Diagnosis not present

## 2019-11-30 DIAGNOSIS — I428 Other cardiomyopathies: Secondary | ICD-10-CM | POA: Diagnosis not present

## 2019-11-30 DIAGNOSIS — N179 Acute kidney failure, unspecified: Secondary | ICD-10-CM | POA: Diagnosis not present

## 2019-11-30 DIAGNOSIS — R0602 Shortness of breath: Secondary | ICD-10-CM | POA: Diagnosis not present

## 2019-11-30 DIAGNOSIS — R748 Abnormal levels of other serum enzymes: Secondary | ICD-10-CM | POA: Diagnosis not present

## 2019-11-30 DIAGNOSIS — N181 Chronic kidney disease, stage 1: Secondary | ICD-10-CM | POA: Diagnosis not present

## 2019-11-30 DIAGNOSIS — E039 Hypothyroidism, unspecified: Secondary | ICD-10-CM | POA: Diagnosis not present

## 2019-11-30 DIAGNOSIS — I420 Dilated cardiomyopathy: Secondary | ICD-10-CM | POA: Diagnosis not present

## 2019-11-30 DIAGNOSIS — R972 Elevated prostate specific antigen [PSA]: Secondary | ICD-10-CM | POA: Diagnosis not present

## 2019-11-30 DIAGNOSIS — F0390 Unspecified dementia without behavioral disturbance: Secondary | ICD-10-CM | POA: Diagnosis not present

## 2019-11-30 DIAGNOSIS — Z0000 Encounter for general adult medical examination without abnormal findings: Secondary | ICD-10-CM | POA: Diagnosis not present

## 2019-11-30 DIAGNOSIS — Z01818 Encounter for other preprocedural examination: Secondary | ICD-10-CM | POA: Diagnosis not present

## 2019-11-30 DIAGNOSIS — R8290 Unspecified abnormal findings in urine: Secondary | ICD-10-CM | POA: Diagnosis not present

## 2019-11-30 DIAGNOSIS — S22039D Unspecified fracture of third thoracic vertebra, subsequent encounter for fracture with routine healing: Secondary | ICD-10-CM | POA: Diagnosis not present

## 2019-11-30 DIAGNOSIS — E1129 Type 2 diabetes mellitus with other diabetic kidney complication: Secondary | ICD-10-CM | POA: Diagnosis not present

## 2019-11-30 DIAGNOSIS — E785 Hyperlipidemia, unspecified: Secondary | ICD-10-CM | POA: Diagnosis not present

## 2019-11-30 DIAGNOSIS — R110 Nausea: Secondary | ICD-10-CM | POA: Diagnosis not present

## 2019-11-30 DIAGNOSIS — E1159 Type 2 diabetes mellitus with other circulatory complications: Secondary | ICD-10-CM | POA: Diagnosis not present

## 2019-11-30 DIAGNOSIS — M109 Gout, unspecified: Secondary | ICD-10-CM | POA: Diagnosis not present

## 2019-11-30 DIAGNOSIS — I4819 Other persistent atrial fibrillation: Secondary | ICD-10-CM | POA: Diagnosis not present

## 2019-11-30 DIAGNOSIS — G4733 Obstructive sleep apnea (adult) (pediatric): Secondary | ICD-10-CM | POA: Diagnosis not present

## 2019-11-30 DIAGNOSIS — R768 Other specified abnormal immunological findings in serum: Secondary | ICD-10-CM | POA: Diagnosis not present

## 2019-11-30 DIAGNOSIS — K5730 Diverticulosis of large intestine without perforation or abscess without bleeding: Secondary | ICD-10-CM | POA: Diagnosis not present

## 2019-11-30 DIAGNOSIS — I5022 Chronic systolic (congestive) heart failure: Secondary | ICD-10-CM | POA: Diagnosis not present

## 2019-11-30 DIAGNOSIS — D400 Neoplasm of uncertain behavior of prostate: Secondary | ICD-10-CM | POA: Diagnosis not present

## 2019-11-30 DIAGNOSIS — R2681 Unsteadiness on feet: Secondary | ICD-10-CM | POA: Diagnosis not present

## 2019-11-30 DIAGNOSIS — N3000 Acute cystitis without hematuria: Secondary | ICD-10-CM | POA: Diagnosis not present

## 2019-11-30 DIAGNOSIS — R300 Dysuria: Secondary | ICD-10-CM | POA: Diagnosis not present

## 2019-11-30 DIAGNOSIS — I4892 Unspecified atrial flutter: Secondary | ICD-10-CM | POA: Diagnosis not present

## 2019-11-30 DIAGNOSIS — M7060 Trochanteric bursitis, unspecified hip: Secondary | ICD-10-CM | POA: Diagnosis not present

## 2019-11-30 DIAGNOSIS — M25551 Pain in right hip: Secondary | ICD-10-CM | POA: Diagnosis not present

## 2019-11-30 DIAGNOSIS — F325 Major depressive disorder, single episode, in full remission: Secondary | ICD-10-CM | POA: Diagnosis not present

## 2019-11-30 DIAGNOSIS — R1110 Vomiting, unspecified: Secondary | ICD-10-CM | POA: Diagnosis not present

## 2019-11-30 DIAGNOSIS — R82998 Other abnormal findings in urine: Secondary | ICD-10-CM | POA: Diagnosis not present

## 2019-11-30 DIAGNOSIS — R0902 Hypoxemia: Secondary | ICD-10-CM | POA: Diagnosis not present

## 2019-11-30 DIAGNOSIS — E6609 Other obesity due to excess calories: Secondary | ICD-10-CM | POA: Diagnosis not present

## 2019-11-30 DIAGNOSIS — C73 Malignant neoplasm of thyroid gland: Secondary | ICD-10-CM | POA: Diagnosis not present

## 2019-11-30 DIAGNOSIS — E119 Type 2 diabetes mellitus without complications: Secondary | ICD-10-CM | POA: Diagnosis not present

## 2019-11-30 DIAGNOSIS — Z20822 Contact with and (suspected) exposure to covid-19: Secondary | ICD-10-CM | POA: Diagnosis not present

## 2019-11-30 DIAGNOSIS — M5417 Radiculopathy, lumbosacral region: Secondary | ICD-10-CM | POA: Diagnosis not present

## 2019-11-30 DIAGNOSIS — R931 Abnormal findings on diagnostic imaging of heart and coronary circulation: Secondary | ICD-10-CM | POA: Diagnosis not present

## 2019-11-30 DIAGNOSIS — Z87891 Personal history of nicotine dependence: Secondary | ICD-10-CM | POA: Diagnosis not present

## 2019-11-30 DIAGNOSIS — M5137 Other intervertebral disc degeneration, lumbosacral region: Secondary | ICD-10-CM | POA: Diagnosis not present

## 2019-11-30 DIAGNOSIS — G7000 Myasthenia gravis without (acute) exacerbation: Secondary | ICD-10-CM | POA: Diagnosis not present

## 2019-11-30 DIAGNOSIS — R3915 Urgency of urination: Secondary | ICD-10-CM | POA: Diagnosis not present

## 2019-11-30 DIAGNOSIS — Z951 Presence of aortocoronary bypass graft: Secondary | ICD-10-CM | POA: Diagnosis not present

## 2019-11-30 DIAGNOSIS — N184 Chronic kidney disease, stage 4 (severe): Secondary | ICD-10-CM | POA: Diagnosis not present

## 2019-11-30 DIAGNOSIS — Z7689 Persons encountering health services in other specified circumstances: Secondary | ICD-10-CM | POA: Diagnosis not present

## 2019-11-30 DIAGNOSIS — D571 Sickle-cell disease without crisis: Secondary | ICD-10-CM | POA: Diagnosis not present

## 2019-11-30 DIAGNOSIS — Z79891 Long term (current) use of opiate analgesic: Secondary | ICD-10-CM | POA: Diagnosis not present

## 2019-11-30 DIAGNOSIS — Z8614 Personal history of Methicillin resistant Staphylococcus aureus infection: Secondary | ICD-10-CM | POA: Diagnosis not present

## 2019-11-30 DIAGNOSIS — G35 Multiple sclerosis: Secondary | ICD-10-CM | POA: Diagnosis not present

## 2019-11-30 DIAGNOSIS — M9904 Segmental and somatic dysfunction of sacral region: Secondary | ICD-10-CM | POA: Diagnosis not present

## 2019-11-30 DIAGNOSIS — N951 Menopausal and female climacteric states: Secondary | ICD-10-CM | POA: Diagnosis not present

## 2019-11-30 DIAGNOSIS — M6281 Muscle weakness (generalized): Secondary | ICD-10-CM | POA: Diagnosis not present

## 2019-11-30 DIAGNOSIS — K219 Gastro-esophageal reflux disease without esophagitis: Secondary | ICD-10-CM | POA: Diagnosis not present

## 2019-11-30 DIAGNOSIS — Z85038 Personal history of other malignant neoplasm of large intestine: Secondary | ICD-10-CM | POA: Diagnosis not present

## 2019-11-30 DIAGNOSIS — M545 Low back pain: Secondary | ICD-10-CM | POA: Diagnosis not present

## 2019-11-30 DIAGNOSIS — Z8546 Personal history of malignant neoplasm of prostate: Secondary | ICD-10-CM | POA: Diagnosis not present

## 2019-11-30 DIAGNOSIS — E7800 Pure hypercholesterolemia, unspecified: Secondary | ICD-10-CM | POA: Diagnosis not present

## 2019-11-30 DIAGNOSIS — I130 Hypertensive heart and chronic kidney disease with heart failure and stage 1 through stage 4 chronic kidney disease, or unspecified chronic kidney disease: Secondary | ICD-10-CM | POA: Diagnosis not present

## 2019-11-30 DIAGNOSIS — W57XXXA Bitten or stung by nonvenomous insect and other nonvenomous arthropods, initial encounter: Secondary | ICD-10-CM | POA: Diagnosis not present

## 2019-11-30 DIAGNOSIS — M5136 Other intervertebral disc degeneration, lumbar region: Secondary | ICD-10-CM | POA: Diagnosis not present

## 2019-11-30 DIAGNOSIS — R109 Unspecified abdominal pain: Secondary | ICD-10-CM | POA: Diagnosis not present

## 2019-11-30 DIAGNOSIS — G40209 Localization-related (focal) (partial) symptomatic epilepsy and epileptic syndromes with complex partial seizures, not intractable, without status epilepticus: Secondary | ICD-10-CM | POA: Diagnosis not present

## 2019-11-30 DIAGNOSIS — M0600 Rheumatoid arthritis without rheumatoid factor, unspecified site: Secondary | ICD-10-CM | POA: Diagnosis not present

## 2019-11-30 DIAGNOSIS — Z1329 Encounter for screening for other suspected endocrine disorder: Secondary | ICD-10-CM | POA: Diagnosis not present

## 2019-11-30 DIAGNOSIS — N189 Chronic kidney disease, unspecified: Secondary | ICD-10-CM | POA: Diagnosis not present

## 2019-11-30 DIAGNOSIS — Z79890 Hormone replacement therapy (postmenopausal): Secondary | ICD-10-CM | POA: Diagnosis not present

## 2019-11-30 DIAGNOSIS — R197 Diarrhea, unspecified: Secondary | ICD-10-CM | POA: Diagnosis not present

## 2019-11-30 DIAGNOSIS — D751 Secondary polycythemia: Secondary | ICD-10-CM | POA: Diagnosis not present

## 2019-11-30 DIAGNOSIS — N3281 Overactive bladder: Secondary | ICD-10-CM | POA: Diagnosis not present

## 2019-11-30 DIAGNOSIS — R7989 Other specified abnormal findings of blood chemistry: Secondary | ICD-10-CM | POA: Diagnosis not present

## 2019-11-30 DIAGNOSIS — M9903 Segmental and somatic dysfunction of lumbar region: Secondary | ICD-10-CM | POA: Diagnosis not present

## 2019-11-30 DIAGNOSIS — E291 Testicular hypofunction: Secondary | ICD-10-CM | POA: Diagnosis not present

## 2019-11-30 DIAGNOSIS — Z01812 Encounter for preprocedural laboratory examination: Secondary | ICD-10-CM | POA: Diagnosis not present

## 2019-11-30 DIAGNOSIS — I25119 Atherosclerotic heart disease of native coronary artery with unspecified angina pectoris: Secondary | ICD-10-CM | POA: Diagnosis not present

## 2019-11-30 DIAGNOSIS — E669 Obesity, unspecified: Secondary | ICD-10-CM | POA: Diagnosis not present

## 2019-11-30 DIAGNOSIS — Z87440 Personal history of urinary (tract) infections: Secondary | ICD-10-CM | POA: Diagnosis not present

## 2019-11-30 DIAGNOSIS — R0981 Nasal congestion: Secondary | ICD-10-CM | POA: Diagnosis not present

## 2019-11-30 DIAGNOSIS — I7300 Raynaud's syndrome without gangrene: Secondary | ICD-10-CM | POA: Diagnosis not present

## 2019-11-30 DIAGNOSIS — R6889 Other general symptoms and signs: Secondary | ICD-10-CM | POA: Diagnosis not present

## 2019-11-30 DIAGNOSIS — C61 Malignant neoplasm of prostate: Secondary | ICD-10-CM | POA: Diagnosis not present

## 2019-11-30 DIAGNOSIS — J45909 Unspecified asthma, uncomplicated: Secondary | ICD-10-CM | POA: Diagnosis not present

## 2019-11-30 DIAGNOSIS — W010XXD Fall on same level from slipping, tripping and stumbling without subsequent striking against object, subsequent encounter: Secondary | ICD-10-CM | POA: Diagnosis not present

## 2019-11-30 DIAGNOSIS — M47817 Spondylosis without myelopathy or radiculopathy, lumbosacral region: Secondary | ICD-10-CM | POA: Diagnosis not present

## 2019-11-30 DIAGNOSIS — R609 Edema, unspecified: Secondary | ICD-10-CM | POA: Diagnosis not present

## 2019-11-30 DIAGNOSIS — M25561 Pain in right knee: Secondary | ICD-10-CM | POA: Diagnosis not present

## 2019-11-30 DIAGNOSIS — R809 Proteinuria, unspecified: Secondary | ICD-10-CM | POA: Diagnosis not present

## 2019-11-30 DIAGNOSIS — U071 COVID-19: Secondary | ICD-10-CM | POA: Diagnosis not present

## 2019-11-30 DIAGNOSIS — I480 Paroxysmal atrial fibrillation: Secondary | ICD-10-CM | POA: Diagnosis not present

## 2019-11-30 DIAGNOSIS — S30861A Insect bite (nonvenomous) of abdominal wall, initial encounter: Secondary | ICD-10-CM | POA: Diagnosis not present

## 2019-11-30 DIAGNOSIS — Z1159 Encounter for screening for other viral diseases: Secondary | ICD-10-CM | POA: Diagnosis not present

## 2019-11-30 DIAGNOSIS — Z13220 Encounter for screening for lipoid disorders: Secondary | ICD-10-CM | POA: Diagnosis not present

## 2019-11-30 DIAGNOSIS — M4316 Spondylolisthesis, lumbar region: Secondary | ICD-10-CM | POA: Diagnosis not present

## 2019-11-30 DIAGNOSIS — S3210XD Unspecified fracture of sacrum, subsequent encounter for fracture with routine healing: Secondary | ICD-10-CM | POA: Diagnosis not present

## 2019-11-30 DIAGNOSIS — L02211 Cutaneous abscess of abdominal wall: Secondary | ICD-10-CM | POA: Diagnosis not present

## 2019-11-30 DIAGNOSIS — J189 Pneumonia, unspecified organism: Secondary | ICD-10-CM | POA: Diagnosis not present

## 2019-11-30 DIAGNOSIS — R7302 Impaired glucose tolerance (oral): Secondary | ICD-10-CM | POA: Diagnosis not present

## 2019-11-30 DIAGNOSIS — N3001 Acute cystitis with hematuria: Secondary | ICD-10-CM | POA: Diagnosis not present

## 2019-11-30 DIAGNOSIS — S066X0D Traumatic subarachnoid hemorrhage without loss of consciousness, subsequent encounter: Secondary | ICD-10-CM | POA: Diagnosis not present

## 2019-11-30 DIAGNOSIS — R55 Syncope and collapse: Secondary | ICD-10-CM | POA: Diagnosis not present

## 2019-11-30 DIAGNOSIS — R52 Pain, unspecified: Secondary | ICD-10-CM | POA: Diagnosis not present

## 2019-11-30 DIAGNOSIS — E782 Mixed hyperlipidemia: Secondary | ICD-10-CM | POA: Diagnosis not present

## 2019-11-30 DIAGNOSIS — Z8639 Personal history of other endocrine, nutritional and metabolic disease: Secondary | ICD-10-CM | POA: Diagnosis not present

## 2019-11-30 DIAGNOSIS — Z7901 Long term (current) use of anticoagulants: Secondary | ICD-10-CM | POA: Diagnosis not present

## 2019-11-30 DIAGNOSIS — Z8616 Personal history of COVID-19: Secondary | ICD-10-CM | POA: Diagnosis not present

## 2019-11-30 DIAGNOSIS — N529 Male erectile dysfunction, unspecified: Secondary | ICD-10-CM | POA: Diagnosis not present

## 2019-11-30 DIAGNOSIS — N401 Enlarged prostate with lower urinary tract symptoms: Secondary | ICD-10-CM | POA: Diagnosis not present

## 2019-12-01 DIAGNOSIS — N1832 Chronic kidney disease, stage 3b: Secondary | ICD-10-CM | POA: Diagnosis not present

## 2019-12-01 DIAGNOSIS — N390 Urinary tract infection, site not specified: Secondary | ICD-10-CM | POA: Diagnosis not present

## 2019-12-01 DIAGNOSIS — E039 Hypothyroidism, unspecified: Secondary | ICD-10-CM | POA: Diagnosis not present

## 2019-12-01 DIAGNOSIS — Z1329 Encounter for screening for other suspected endocrine disorder: Secondary | ICD-10-CM | POA: Diagnosis not present

## 2019-12-01 DIAGNOSIS — N401 Enlarged prostate with lower urinary tract symptoms: Secondary | ICD-10-CM | POA: Diagnosis not present

## 2019-12-01 DIAGNOSIS — Z0001 Encounter for general adult medical examination with abnormal findings: Secondary | ICD-10-CM | POA: Diagnosis not present

## 2019-12-01 DIAGNOSIS — R7309 Other abnormal glucose: Secondary | ICD-10-CM | POA: Diagnosis not present

## 2019-12-01 DIAGNOSIS — D122 Benign neoplasm of ascending colon: Secondary | ICD-10-CM | POA: Diagnosis not present

## 2019-12-01 DIAGNOSIS — R310 Gross hematuria: Secondary | ICD-10-CM | POA: Diagnosis not present

## 2019-12-01 DIAGNOSIS — I482 Chronic atrial fibrillation, unspecified: Secondary | ICD-10-CM | POA: Diagnosis not present

## 2019-12-01 DIAGNOSIS — Z944 Liver transplant status: Secondary | ICD-10-CM | POA: Diagnosis not present

## 2019-12-01 DIAGNOSIS — R7989 Other specified abnormal findings of blood chemistry: Secondary | ICD-10-CM | POA: Diagnosis not present

## 2019-12-01 DIAGNOSIS — F315 Bipolar disorder, current episode depressed, severe, with psychotic features: Secondary | ICD-10-CM | POA: Diagnosis not present

## 2019-12-01 DIAGNOSIS — R82998 Other abnormal findings in urine: Secondary | ICD-10-CM | POA: Diagnosis not present

## 2019-12-01 DIAGNOSIS — N182 Chronic kidney disease, stage 2 (mild): Secondary | ICD-10-CM | POA: Diagnosis not present

## 2019-12-01 DIAGNOSIS — R2681 Unsteadiness on feet: Secondary | ICD-10-CM | POA: Diagnosis not present

## 2019-12-01 DIAGNOSIS — C61 Malignant neoplasm of prostate: Secondary | ICD-10-CM | POA: Diagnosis not present

## 2019-12-01 DIAGNOSIS — Z1211 Encounter for screening for malignant neoplasm of colon: Secondary | ICD-10-CM | POA: Diagnosis not present

## 2019-12-01 DIAGNOSIS — G479 Sleep disorder, unspecified: Secondary | ICD-10-CM | POA: Diagnosis not present

## 2019-12-01 DIAGNOSIS — R509 Fever, unspecified: Secondary | ICD-10-CM | POA: Diagnosis not present

## 2019-12-01 DIAGNOSIS — M8580 Other specified disorders of bone density and structure, unspecified site: Secondary | ICD-10-CM | POA: Diagnosis not present

## 2019-12-01 DIAGNOSIS — M0579 Rheumatoid arthritis with rheumatoid factor of multiple sites without organ or systems involvement: Secondary | ICD-10-CM | POA: Diagnosis not present

## 2019-12-01 DIAGNOSIS — D509 Iron deficiency anemia, unspecified: Secondary | ICD-10-CM | POA: Diagnosis not present

## 2019-12-01 DIAGNOSIS — R42 Dizziness and giddiness: Secondary | ICD-10-CM | POA: Diagnosis not present

## 2019-12-01 DIAGNOSIS — R7303 Prediabetes: Secondary | ICD-10-CM | POA: Diagnosis not present

## 2019-12-01 DIAGNOSIS — E213 Hyperparathyroidism, unspecified: Secondary | ICD-10-CM | POA: Diagnosis not present

## 2019-12-01 DIAGNOSIS — Z950 Presence of cardiac pacemaker: Secondary | ICD-10-CM | POA: Diagnosis not present

## 2019-12-01 DIAGNOSIS — G894 Chronic pain syndrome: Secondary | ICD-10-CM | POA: Diagnosis not present

## 2019-12-01 DIAGNOSIS — H9209 Otalgia, unspecified ear: Secondary | ICD-10-CM | POA: Diagnosis not present

## 2019-12-01 DIAGNOSIS — R739 Hyperglycemia, unspecified: Secondary | ICD-10-CM | POA: Diagnosis not present

## 2019-12-01 DIAGNOSIS — M21372 Foot drop, left foot: Secondary | ICD-10-CM | POA: Diagnosis not present

## 2019-12-01 DIAGNOSIS — I110 Hypertensive heart disease with heart failure: Secondary | ICD-10-CM | POA: Diagnosis not present

## 2019-12-01 DIAGNOSIS — D72820 Lymphocytosis (symptomatic): Secondary | ICD-10-CM | POA: Diagnosis not present

## 2019-12-01 DIAGNOSIS — R0902 Hypoxemia: Secondary | ICD-10-CM | POA: Diagnosis not present

## 2019-12-01 DIAGNOSIS — N184 Chronic kidney disease, stage 4 (severe): Secondary | ICD-10-CM | POA: Diagnosis not present

## 2019-12-01 DIAGNOSIS — E034 Atrophy of thyroid (acquired): Secondary | ICD-10-CM | POA: Diagnosis not present

## 2019-12-01 DIAGNOSIS — D692 Other nonthrombocytopenic purpura: Secondary | ICD-10-CM | POA: Diagnosis not present

## 2019-12-01 DIAGNOSIS — R944 Abnormal results of kidney function studies: Secondary | ICD-10-CM | POA: Diagnosis not present

## 2019-12-01 DIAGNOSIS — E1142 Type 2 diabetes mellitus with diabetic polyneuropathy: Secondary | ICD-10-CM | POA: Diagnosis not present

## 2019-12-01 DIAGNOSIS — Z96652 Presence of left artificial knee joint: Secondary | ICD-10-CM | POA: Diagnosis not present

## 2019-12-01 DIAGNOSIS — E538 Deficiency of other specified B group vitamins: Secondary | ICD-10-CM | POA: Diagnosis not present

## 2019-12-01 DIAGNOSIS — D649 Anemia, unspecified: Secondary | ICD-10-CM | POA: Diagnosis not present

## 2019-12-01 DIAGNOSIS — Z298 Encounter for other specified prophylactic measures: Secondary | ICD-10-CM | POA: Diagnosis not present

## 2019-12-01 DIAGNOSIS — M955 Acquired deformity of pelvis: Secondary | ICD-10-CM | POA: Diagnosis not present

## 2019-12-01 DIAGNOSIS — E782 Mixed hyperlipidemia: Secondary | ICD-10-CM | POA: Diagnosis not present

## 2019-12-01 DIAGNOSIS — R109 Unspecified abdominal pain: Secondary | ICD-10-CM | POA: Diagnosis not present

## 2019-12-01 DIAGNOSIS — K219 Gastro-esophageal reflux disease without esophagitis: Secondary | ICD-10-CM | POA: Diagnosis not present

## 2019-12-01 DIAGNOSIS — R319 Hematuria, unspecified: Secondary | ICD-10-CM | POA: Diagnosis not present

## 2019-12-01 DIAGNOSIS — E663 Overweight: Secondary | ICD-10-CM | POA: Diagnosis not present

## 2019-12-01 DIAGNOSIS — E7849 Other hyperlipidemia: Secondary | ICD-10-CM | POA: Diagnosis not present

## 2019-12-01 DIAGNOSIS — Z23 Encounter for immunization: Secondary | ICD-10-CM | POA: Diagnosis not present

## 2019-12-01 DIAGNOSIS — Q8909 Congenital malformations of spleen: Secondary | ICD-10-CM | POA: Diagnosis not present

## 2019-12-01 DIAGNOSIS — E559 Vitamin D deficiency, unspecified: Secondary | ICD-10-CM | POA: Diagnosis not present

## 2019-12-01 DIAGNOSIS — E781 Pure hyperglyceridemia: Secondary | ICD-10-CM | POA: Diagnosis not present

## 2019-12-01 DIAGNOSIS — M6281 Muscle weakness (generalized): Secondary | ICD-10-CM | POA: Diagnosis not present

## 2019-12-01 DIAGNOSIS — I69354 Hemiplegia and hemiparesis following cerebral infarction affecting left non-dominant side: Secondary | ICD-10-CM | POA: Diagnosis not present

## 2019-12-01 DIAGNOSIS — B9721 SARS-associated coronavirus as the cause of diseases classified elsewhere: Secondary | ICD-10-CM | POA: Diagnosis not present

## 2019-12-01 DIAGNOSIS — E6609 Other obesity due to excess calories: Secondary | ICD-10-CM | POA: Diagnosis not present

## 2019-12-01 DIAGNOSIS — M9905 Segmental and somatic dysfunction of pelvic region: Secondary | ICD-10-CM | POA: Diagnosis not present

## 2019-12-01 DIAGNOSIS — M47816 Spondylosis without myelopathy or radiculopathy, lumbar region: Secondary | ICD-10-CM | POA: Diagnosis not present

## 2019-12-01 DIAGNOSIS — I2510 Atherosclerotic heart disease of native coronary artery without angina pectoris: Secondary | ICD-10-CM | POA: Diagnosis not present

## 2019-12-01 DIAGNOSIS — M1711 Unilateral primary osteoarthritis, right knee: Secondary | ICD-10-CM | POA: Diagnosis not present

## 2019-12-01 DIAGNOSIS — R809 Proteinuria, unspecified: Secondary | ICD-10-CM | POA: Diagnosis not present

## 2019-12-01 DIAGNOSIS — H0102A Squamous blepharitis right eye, upper and lower eyelids: Secondary | ICD-10-CM | POA: Diagnosis not present

## 2019-12-01 DIAGNOSIS — M25519 Pain in unspecified shoulder: Secondary | ICD-10-CM | POA: Diagnosis not present

## 2019-12-01 DIAGNOSIS — M069 Rheumatoid arthritis, unspecified: Secondary | ICD-10-CM | POA: Diagnosis not present

## 2019-12-01 DIAGNOSIS — K9089 Other intestinal malabsorption: Secondary | ICD-10-CM | POA: Diagnosis not present

## 2019-12-01 DIAGNOSIS — I252 Old myocardial infarction: Secondary | ICD-10-CM | POA: Diagnosis not present

## 2019-12-01 DIAGNOSIS — E1165 Type 2 diabetes mellitus with hyperglycemia: Secondary | ICD-10-CM | POA: Diagnosis not present

## 2019-12-01 DIAGNOSIS — H0102B Squamous blepharitis left eye, upper and lower eyelids: Secondary | ICD-10-CM | POA: Diagnosis not present

## 2019-12-01 DIAGNOSIS — U071 COVID-19: Secondary | ICD-10-CM | POA: Diagnosis not present

## 2019-12-01 DIAGNOSIS — H04123 Dry eye syndrome of bilateral lacrimal glands: Secondary | ICD-10-CM | POA: Diagnosis not present

## 2019-12-01 DIAGNOSIS — I129 Hypertensive chronic kidney disease with stage 1 through stage 4 chronic kidney disease, or unspecified chronic kidney disease: Secondary | ICD-10-CM | POA: Diagnosis not present

## 2019-12-01 DIAGNOSIS — D631 Anemia in chronic kidney disease: Secondary | ICD-10-CM | POA: Diagnosis not present

## 2019-12-01 DIAGNOSIS — G250 Essential tremor: Secondary | ICD-10-CM | POA: Diagnosis not present

## 2019-12-01 DIAGNOSIS — J3489 Other specified disorders of nose and nasal sinuses: Secondary | ICD-10-CM | POA: Diagnosis not present

## 2019-12-01 DIAGNOSIS — I493 Ventricular premature depolarization: Secondary | ICD-10-CM | POA: Diagnosis not present

## 2019-12-01 DIAGNOSIS — G2581 Restless legs syndrome: Secondary | ICD-10-CM | POA: Diagnosis not present

## 2019-12-01 DIAGNOSIS — M1712 Unilateral primary osteoarthritis, left knee: Secondary | ICD-10-CM | POA: Diagnosis not present

## 2019-12-01 DIAGNOSIS — G629 Polyneuropathy, unspecified: Secondary | ICD-10-CM | POA: Diagnosis not present

## 2019-12-01 DIAGNOSIS — E890 Postprocedural hypothyroidism: Secondary | ICD-10-CM | POA: Diagnosis not present

## 2019-12-01 DIAGNOSIS — M531 Cervicobrachial syndrome: Secondary | ICD-10-CM | POA: Diagnosis not present

## 2019-12-01 DIAGNOSIS — R5383 Other fatigue: Secondary | ICD-10-CM | POA: Diagnosis not present

## 2019-12-01 DIAGNOSIS — L0291 Cutaneous abscess, unspecified: Secondary | ICD-10-CM | POA: Diagnosis not present

## 2019-12-01 DIAGNOSIS — R945 Abnormal results of liver function studies: Secondary | ICD-10-CM | POA: Diagnosis not present

## 2019-12-01 DIAGNOSIS — Z20822 Contact with and (suspected) exposure to covid-19: Secondary | ICD-10-CM | POA: Diagnosis not present

## 2019-12-01 DIAGNOSIS — M810 Age-related osteoporosis without current pathological fracture: Secondary | ICD-10-CM | POA: Diagnosis not present

## 2019-12-01 DIAGNOSIS — B0052 Herpesviral keratitis: Secondary | ICD-10-CM | POA: Diagnosis not present

## 2019-12-01 DIAGNOSIS — I429 Cardiomyopathy, unspecified: Secondary | ICD-10-CM | POA: Diagnosis not present

## 2019-12-01 DIAGNOSIS — Z20828 Contact with and (suspected) exposure to other viral communicable diseases: Secondary | ICD-10-CM | POA: Diagnosis not present

## 2019-12-01 DIAGNOSIS — Z6839 Body mass index (BMI) 39.0-39.9, adult: Secondary | ICD-10-CM | POA: Diagnosis not present

## 2019-12-01 DIAGNOSIS — M79606 Pain in leg, unspecified: Secondary | ICD-10-CM | POA: Diagnosis not present

## 2019-12-01 DIAGNOSIS — Z6830 Body mass index (BMI) 30.0-30.9, adult: Secondary | ICD-10-CM | POA: Diagnosis not present

## 2019-12-01 DIAGNOSIS — F330 Major depressive disorder, recurrent, mild: Secondary | ICD-10-CM | POA: Diagnosis not present

## 2019-12-01 DIAGNOSIS — E291 Testicular hypofunction: Secondary | ICD-10-CM | POA: Diagnosis not present

## 2019-12-01 DIAGNOSIS — I5020 Unspecified systolic (congestive) heart failure: Secondary | ICD-10-CM | POA: Diagnosis not present

## 2019-12-01 DIAGNOSIS — H2511 Age-related nuclear cataract, right eye: Secondary | ICD-10-CM | POA: Diagnosis not present

## 2019-12-01 DIAGNOSIS — R6889 Other general symptoms and signs: Secondary | ICD-10-CM | POA: Diagnosis not present

## 2019-12-01 DIAGNOSIS — F418 Other specified anxiety disorders: Secondary | ICD-10-CM | POA: Diagnosis not present

## 2019-12-01 DIAGNOSIS — E7800 Pure hypercholesterolemia, unspecified: Secondary | ICD-10-CM | POA: Diagnosis not present

## 2019-12-01 DIAGNOSIS — Z1231 Encounter for screening mammogram for malignant neoplasm of breast: Secondary | ICD-10-CM | POA: Diagnosis not present

## 2019-12-01 DIAGNOSIS — F0281 Dementia in other diseases classified elsewhere with behavioral disturbance: Secondary | ICD-10-CM | POA: Diagnosis not present

## 2019-12-01 DIAGNOSIS — I10 Essential (primary) hypertension: Secondary | ICD-10-CM | POA: Diagnosis not present

## 2019-12-01 DIAGNOSIS — M9903 Segmental and somatic dysfunction of lumbar region: Secondary | ICD-10-CM | POA: Diagnosis not present

## 2019-12-01 DIAGNOSIS — Z1159 Encounter for screening for other viral diseases: Secondary | ICD-10-CM | POA: Diagnosis not present

## 2019-12-01 DIAGNOSIS — E119 Type 2 diabetes mellitus without complications: Secondary | ICD-10-CM | POA: Diagnosis not present

## 2019-12-01 DIAGNOSIS — M2042 Other hammer toe(s) (acquired), left foot: Secondary | ICD-10-CM | POA: Diagnosis not present

## 2019-12-01 DIAGNOSIS — N183 Chronic kidney disease, stage 3 unspecified: Secondary | ICD-10-CM | POA: Diagnosis not present

## 2019-12-01 DIAGNOSIS — Z1389 Encounter for screening for other disorder: Secondary | ICD-10-CM | POA: Diagnosis not present

## 2019-12-01 DIAGNOSIS — N4231 Prostatic intraepithelial neoplasia: Secondary | ICD-10-CM | POA: Diagnosis not present

## 2019-12-01 DIAGNOSIS — I272 Pulmonary hypertension, unspecified: Secondary | ICD-10-CM | POA: Diagnosis not present

## 2019-12-01 DIAGNOSIS — E669 Obesity, unspecified: Secondary | ICD-10-CM | POA: Diagnosis not present

## 2019-12-01 DIAGNOSIS — N3001 Acute cystitis with hematuria: Secondary | ICD-10-CM | POA: Diagnosis not present

## 2019-12-01 DIAGNOSIS — L03116 Cellulitis of left lower limb: Secondary | ICD-10-CM | POA: Diagnosis not present

## 2019-12-01 DIAGNOSIS — R7301 Impaired fasting glucose: Secondary | ICD-10-CM | POA: Diagnosis not present

## 2019-12-01 DIAGNOSIS — I255 Ischemic cardiomyopathy: Secondary | ICD-10-CM | POA: Diagnosis not present

## 2019-12-01 DIAGNOSIS — M5442 Lumbago with sciatica, left side: Secondary | ICD-10-CM | POA: Diagnosis not present

## 2019-12-01 DIAGNOSIS — I700 Atherosclerosis of aorta: Secondary | ICD-10-CM | POA: Diagnosis not present

## 2019-12-01 DIAGNOSIS — Z7689 Persons encountering health services in other specified circumstances: Secondary | ICD-10-CM | POA: Diagnosis not present

## 2019-12-01 DIAGNOSIS — N1831 Chronic kidney disease, stage 3a: Secondary | ICD-10-CM | POA: Diagnosis not present

## 2019-12-01 DIAGNOSIS — R972 Elevated prostate specific antigen [PSA]: Secondary | ICD-10-CM | POA: Diagnosis not present

## 2019-12-01 DIAGNOSIS — Z125 Encounter for screening for malignant neoplasm of prostate: Secondary | ICD-10-CM | POA: Diagnosis not present

## 2019-12-01 DIAGNOSIS — D849 Immunodeficiency, unspecified: Secondary | ICD-10-CM | POA: Diagnosis not present

## 2019-12-01 DIAGNOSIS — Z7901 Long term (current) use of anticoagulants: Secondary | ICD-10-CM | POA: Diagnosis not present

## 2019-12-01 DIAGNOSIS — Z6831 Body mass index (BMI) 31.0-31.9, adult: Secondary | ICD-10-CM | POA: Diagnosis not present

## 2019-12-01 DIAGNOSIS — Z681 Body mass index (BMI) 19 or less, adult: Secondary | ICD-10-CM | POA: Diagnosis not present

## 2019-12-01 DIAGNOSIS — I5042 Chronic combined systolic (congestive) and diastolic (congestive) heart failure: Secondary | ICD-10-CM | POA: Diagnosis not present

## 2019-12-01 DIAGNOSIS — E1122 Type 2 diabetes mellitus with diabetic chronic kidney disease: Secondary | ICD-10-CM | POA: Diagnosis not present

## 2019-12-01 DIAGNOSIS — I208 Other forms of angina pectoris: Secondary | ICD-10-CM | POA: Diagnosis not present

## 2019-12-01 DIAGNOSIS — N950 Postmenopausal bleeding: Secondary | ICD-10-CM | POA: Diagnosis not present

## 2019-12-01 DIAGNOSIS — E1140 Type 2 diabetes mellitus with diabetic neuropathy, unspecified: Secondary | ICD-10-CM | POA: Diagnosis not present

## 2019-12-01 DIAGNOSIS — Z79899 Other long term (current) drug therapy: Secondary | ICD-10-CM | POA: Diagnosis not present

## 2019-12-01 DIAGNOSIS — R8271 Bacteriuria: Secondary | ICD-10-CM | POA: Diagnosis not present

## 2019-12-01 DIAGNOSIS — H2512 Age-related nuclear cataract, left eye: Secondary | ICD-10-CM | POA: Diagnosis not present

## 2019-12-01 DIAGNOSIS — I480 Paroxysmal atrial fibrillation: Secondary | ICD-10-CM | POA: Diagnosis not present

## 2019-12-01 DIAGNOSIS — R7302 Impaired glucose tolerance (oral): Secondary | ICD-10-CM | POA: Diagnosis not present

## 2019-12-01 DIAGNOSIS — R05 Cough: Secondary | ICD-10-CM | POA: Diagnosis not present

## 2019-12-01 DIAGNOSIS — M2041 Other hammer toe(s) (acquired), right foot: Secondary | ICD-10-CM | POA: Diagnosis not present

## 2019-12-01 DIAGNOSIS — G63 Polyneuropathy in diseases classified elsewhere: Secondary | ICD-10-CM | POA: Diagnosis not present

## 2019-12-01 DIAGNOSIS — M5416 Radiculopathy, lumbar region: Secondary | ICD-10-CM | POA: Diagnosis not present

## 2019-12-01 DIAGNOSIS — D638 Anemia in other chronic diseases classified elsewhere: Secondary | ICD-10-CM | POA: Diagnosis not present

## 2019-12-01 DIAGNOSIS — N400 Enlarged prostate without lower urinary tract symptoms: Secondary | ICD-10-CM | POA: Diagnosis not present

## 2019-12-01 DIAGNOSIS — Z6826 Body mass index (BMI) 26.0-26.9, adult: Secondary | ICD-10-CM | POA: Diagnosis not present

## 2019-12-01 DIAGNOSIS — M542 Cervicalgia: Secondary | ICD-10-CM | POA: Diagnosis not present

## 2019-12-01 DIAGNOSIS — R350 Frequency of micturition: Secondary | ICD-10-CM | POA: Diagnosis not present

## 2019-12-01 DIAGNOSIS — E785 Hyperlipidemia, unspecified: Secondary | ICD-10-CM | POA: Diagnosis not present

## 2019-12-01 DIAGNOSIS — I5032 Chronic diastolic (congestive) heart failure: Secondary | ICD-10-CM | POA: Diagnosis not present

## 2019-12-01 DIAGNOSIS — Z01812 Encounter for preprocedural laboratory examination: Secondary | ICD-10-CM | POA: Diagnosis not present

## 2019-12-01 DIAGNOSIS — I2583 Coronary atherosclerosis due to lipid rich plaque: Secondary | ICD-10-CM | POA: Diagnosis not present

## 2019-12-01 DIAGNOSIS — R002 Palpitations: Secondary | ICD-10-CM | POA: Diagnosis not present

## 2019-12-01 DIAGNOSIS — H1033 Unspecified acute conjunctivitis, bilateral: Secondary | ICD-10-CM | POA: Diagnosis not present

## 2019-12-01 DIAGNOSIS — R300 Dysuria: Secondary | ICD-10-CM | POA: Diagnosis not present

## 2019-12-01 DIAGNOSIS — I4819 Other persistent atrial fibrillation: Secondary | ICD-10-CM | POA: Diagnosis not present

## 2019-12-01 DIAGNOSIS — M9901 Segmental and somatic dysfunction of cervical region: Secondary | ICD-10-CM | POA: Diagnosis not present

## 2019-12-01 DIAGNOSIS — S0083XA Contusion of other part of head, initial encounter: Secondary | ICD-10-CM | POA: Diagnosis not present

## 2019-12-01 DIAGNOSIS — M79603 Pain in arm, unspecified: Secondary | ICD-10-CM | POA: Diagnosis not present

## 2019-12-01 DIAGNOSIS — I4821 Permanent atrial fibrillation: Secondary | ICD-10-CM | POA: Diagnosis not present

## 2019-12-01 DIAGNOSIS — H40012 Open angle with borderline findings, low risk, left eye: Secondary | ICD-10-CM | POA: Diagnosis not present

## 2019-12-01 DIAGNOSIS — I7101 Dissection of thoracic aorta: Secondary | ICD-10-CM | POA: Diagnosis not present

## 2019-12-01 DIAGNOSIS — R590 Localized enlarged lymph nodes: Secondary | ICD-10-CM | POA: Diagnosis not present

## 2019-12-01 DIAGNOSIS — M129 Arthropathy, unspecified: Secondary | ICD-10-CM | POA: Diagnosis not present

## 2019-12-01 DIAGNOSIS — N3289 Other specified disorders of bladder: Secondary | ICD-10-CM | POA: Diagnosis not present

## 2019-12-01 DIAGNOSIS — E1169 Type 2 diabetes mellitus with other specified complication: Secondary | ICD-10-CM | POA: Diagnosis not present

## 2019-12-01 DIAGNOSIS — R519 Headache, unspecified: Secondary | ICD-10-CM | POA: Diagnosis not present

## 2019-12-01 DIAGNOSIS — L0100 Impetigo, unspecified: Secondary | ICD-10-CM | POA: Diagnosis not present

## 2019-12-02 DIAGNOSIS — H15832 Staphyloma posticum, left eye: Secondary | ICD-10-CM | POA: Diagnosis not present

## 2019-12-02 DIAGNOSIS — H35453 Secondary pigmentary degeneration, bilateral: Secondary | ICD-10-CM | POA: Diagnosis not present

## 2019-12-02 DIAGNOSIS — R933 Abnormal findings on diagnostic imaging of other parts of digestive tract: Secondary | ICD-10-CM | POA: Diagnosis not present

## 2019-12-02 DIAGNOSIS — Z515 Encounter for palliative care: Secondary | ICD-10-CM | POA: Diagnosis not present

## 2019-12-02 DIAGNOSIS — Z8616 Personal history of COVID-19: Secondary | ICD-10-CM | POA: Diagnosis not present

## 2019-12-02 DIAGNOSIS — E039 Hypothyroidism, unspecified: Secondary | ICD-10-CM | POA: Diagnosis not present

## 2019-12-02 DIAGNOSIS — G20 Parkinson's disease: Secondary | ICD-10-CM | POA: Diagnosis not present

## 2019-12-02 DIAGNOSIS — N3000 Acute cystitis without hematuria: Secondary | ICD-10-CM | POA: Diagnosis not present

## 2019-12-02 DIAGNOSIS — R2681 Unsteadiness on feet: Secondary | ICD-10-CM | POA: Diagnosis not present

## 2019-12-02 DIAGNOSIS — H9201 Otalgia, right ear: Secondary | ICD-10-CM | POA: Diagnosis not present

## 2019-12-02 DIAGNOSIS — H40013 Open angle with borderline findings, low risk, bilateral: Secondary | ICD-10-CM | POA: Diagnosis not present

## 2019-12-02 DIAGNOSIS — Z131 Encounter for screening for diabetes mellitus: Secondary | ICD-10-CM | POA: Diagnosis not present

## 2019-12-02 DIAGNOSIS — R05 Cough: Secondary | ICD-10-CM | POA: Diagnosis not present

## 2019-12-02 DIAGNOSIS — R7401 Elevation of levels of liver transaminase levels: Secondary | ICD-10-CM | POA: Diagnosis not present

## 2019-12-02 DIAGNOSIS — R188 Other ascites: Secondary | ICD-10-CM | POA: Diagnosis not present

## 2019-12-02 DIAGNOSIS — R451 Restlessness and agitation: Secondary | ICD-10-CM | POA: Diagnosis not present

## 2019-12-02 DIAGNOSIS — I70213 Atherosclerosis of native arteries of extremities with intermittent claudication, bilateral legs: Secondary | ICD-10-CM | POA: Diagnosis not present

## 2019-12-02 DIAGNOSIS — Z952 Presence of prosthetic heart valve: Secondary | ICD-10-CM | POA: Diagnosis not present

## 2019-12-02 DIAGNOSIS — R7989 Other specified abnormal findings of blood chemistry: Secondary | ICD-10-CM | POA: Diagnosis not present

## 2019-12-02 DIAGNOSIS — K754 Autoimmune hepatitis: Secondary | ICD-10-CM | POA: Diagnosis not present

## 2019-12-02 DIAGNOSIS — Z1159 Encounter for screening for other viral diseases: Secondary | ICD-10-CM | POA: Diagnosis not present

## 2019-12-02 DIAGNOSIS — N390 Urinary tract infection, site not specified: Secondary | ICD-10-CM | POA: Diagnosis not present

## 2019-12-02 DIAGNOSIS — M129 Arthropathy, unspecified: Secondary | ICD-10-CM | POA: Diagnosis not present

## 2019-12-02 DIAGNOSIS — N401 Enlarged prostate with lower urinary tract symptoms: Secondary | ICD-10-CM | POA: Diagnosis not present

## 2019-12-02 DIAGNOSIS — E860 Dehydration: Secondary | ICD-10-CM | POA: Diagnosis not present

## 2019-12-02 DIAGNOSIS — M961 Postlaminectomy syndrome, not elsewhere classified: Secondary | ICD-10-CM | POA: Diagnosis not present

## 2019-12-02 DIAGNOSIS — M160 Bilateral primary osteoarthritis of hip: Secondary | ICD-10-CM | POA: Diagnosis not present

## 2019-12-02 DIAGNOSIS — R339 Retention of urine, unspecified: Secondary | ICD-10-CM | POA: Diagnosis not present

## 2019-12-02 DIAGNOSIS — M5030 Other cervical disc degeneration, unspecified cervical region: Secondary | ICD-10-CM | POA: Diagnosis not present

## 2019-12-02 DIAGNOSIS — A0472 Enterocolitis due to Clostridium difficile, not specified as recurrent: Secondary | ICD-10-CM | POA: Diagnosis not present

## 2019-12-02 DIAGNOSIS — J984 Other disorders of lung: Secondary | ICD-10-CM | POA: Diagnosis not present

## 2019-12-02 DIAGNOSIS — Z79899 Other long term (current) drug therapy: Secondary | ICD-10-CM | POA: Diagnosis not present

## 2019-12-02 DIAGNOSIS — E890 Postprocedural hypothyroidism: Secondary | ICD-10-CM | POA: Diagnosis not present

## 2019-12-02 DIAGNOSIS — G9341 Metabolic encephalopathy: Secondary | ICD-10-CM | POA: Diagnosis not present

## 2019-12-02 DIAGNOSIS — M47812 Spondylosis without myelopathy or radiculopathy, cervical region: Secondary | ICD-10-CM | POA: Diagnosis not present

## 2019-12-02 DIAGNOSIS — H2522 Age-related cataract, morgagnian type, left eye: Secondary | ICD-10-CM | POA: Diagnosis not present

## 2019-12-02 DIAGNOSIS — R0981 Nasal congestion: Secondary | ICD-10-CM | POA: Diagnosis not present

## 2019-12-02 DIAGNOSIS — D7589 Other specified diseases of blood and blood-forming organs: Secondary | ICD-10-CM | POA: Diagnosis not present

## 2019-12-02 DIAGNOSIS — R600 Localized edema: Secondary | ICD-10-CM | POA: Diagnosis not present

## 2019-12-02 DIAGNOSIS — D509 Iron deficiency anemia, unspecified: Secondary | ICD-10-CM | POA: Diagnosis not present

## 2019-12-02 DIAGNOSIS — E870 Hyperosmolality and hypernatremia: Secondary | ICD-10-CM | POA: Diagnosis not present

## 2019-12-02 DIAGNOSIS — J189 Pneumonia, unspecified organism: Secondary | ICD-10-CM | POA: Diagnosis not present

## 2019-12-02 DIAGNOSIS — Z87891 Personal history of nicotine dependence: Secondary | ICD-10-CM | POA: Diagnosis not present

## 2019-12-02 DIAGNOSIS — I10 Essential (primary) hypertension: Secondary | ICD-10-CM | POA: Diagnosis not present

## 2019-12-02 DIAGNOSIS — F321 Major depressive disorder, single episode, moderate: Secondary | ICD-10-CM | POA: Diagnosis not present

## 2019-12-02 DIAGNOSIS — Z0000 Encounter for general adult medical examination without abnormal findings: Secondary | ICD-10-CM | POA: Diagnosis not present

## 2019-12-02 DIAGNOSIS — M810 Age-related osteoporosis without current pathological fracture: Secondary | ICD-10-CM | POA: Diagnosis not present

## 2019-12-02 DIAGNOSIS — E1169 Type 2 diabetes mellitus with other specified complication: Secondary | ICD-10-CM | POA: Diagnosis not present

## 2019-12-02 DIAGNOSIS — G4761 Periodic limb movement disorder: Secondary | ICD-10-CM | POA: Diagnosis not present

## 2019-12-02 DIAGNOSIS — C61 Malignant neoplasm of prostate: Secondary | ICD-10-CM | POA: Diagnosis not present

## 2019-12-02 DIAGNOSIS — T83511A Infection and inflammatory reaction due to indwelling urethral catheter, initial encounter: Secondary | ICD-10-CM | POA: Diagnosis not present

## 2019-12-02 DIAGNOSIS — M955 Acquired deformity of pelvis: Secondary | ICD-10-CM | POA: Diagnosis not present

## 2019-12-02 DIAGNOSIS — J449 Chronic obstructive pulmonary disease, unspecified: Secondary | ICD-10-CM | POA: Diagnosis not present

## 2019-12-02 DIAGNOSIS — H3561 Retinal hemorrhage, right eye: Secondary | ICD-10-CM | POA: Diagnosis not present

## 2019-12-02 DIAGNOSIS — S22000A Wedge compression fracture of unspecified thoracic vertebra, initial encounter for closed fracture: Secondary | ICD-10-CM | POA: Diagnosis not present

## 2019-12-02 DIAGNOSIS — R0602 Shortness of breath: Secondary | ICD-10-CM | POA: Diagnosis not present

## 2019-12-02 DIAGNOSIS — D090 Carcinoma in situ of bladder: Secondary | ICD-10-CM | POA: Diagnosis not present

## 2019-12-02 DIAGNOSIS — M9903 Segmental and somatic dysfunction of lumbar region: Secondary | ICD-10-CM | POA: Diagnosis not present

## 2019-12-02 DIAGNOSIS — M720 Palmar fascial fibromatosis [Dupuytren]: Secondary | ICD-10-CM | POA: Diagnosis not present

## 2019-12-02 DIAGNOSIS — I5022 Chronic systolic (congestive) heart failure: Secondary | ICD-10-CM | POA: Diagnosis not present

## 2019-12-02 DIAGNOSIS — H25811 Combined forms of age-related cataract, right eye: Secondary | ICD-10-CM | POA: Diagnosis not present

## 2019-12-02 DIAGNOSIS — N3020 Other chronic cystitis without hematuria: Secondary | ICD-10-CM | POA: Diagnosis not present

## 2019-12-02 DIAGNOSIS — J440 Chronic obstructive pulmonary disease with acute lower respiratory infection: Secondary | ICD-10-CM | POA: Diagnosis not present

## 2019-12-02 DIAGNOSIS — E785 Hyperlipidemia, unspecified: Secondary | ICD-10-CM | POA: Diagnosis not present

## 2019-12-02 DIAGNOSIS — E1165 Type 2 diabetes mellitus with hyperglycemia: Secondary | ICD-10-CM | POA: Diagnosis not present

## 2019-12-02 DIAGNOSIS — K2100 Gastro-esophageal reflux disease with esophagitis, without bleeding: Secondary | ICD-10-CM | POA: Diagnosis not present

## 2019-12-02 DIAGNOSIS — R1084 Generalized abdominal pain: Secondary | ICD-10-CM | POA: Diagnosis not present

## 2019-12-02 DIAGNOSIS — G894 Chronic pain syndrome: Secondary | ICD-10-CM | POA: Diagnosis not present

## 2019-12-02 DIAGNOSIS — E782 Mixed hyperlipidemia: Secondary | ICD-10-CM | POA: Diagnosis not present

## 2019-12-02 DIAGNOSIS — K7460 Unspecified cirrhosis of liver: Secondary | ICD-10-CM | POA: Diagnosis not present

## 2019-12-02 DIAGNOSIS — E1159 Type 2 diabetes mellitus with other circulatory complications: Secondary | ICD-10-CM | POA: Diagnosis not present

## 2019-12-02 DIAGNOSIS — R4182 Altered mental status, unspecified: Secondary | ICD-10-CM | POA: Diagnosis not present

## 2019-12-02 DIAGNOSIS — N179 Acute kidney failure, unspecified: Secondary | ICD-10-CM | POA: Diagnosis not present

## 2019-12-02 DIAGNOSIS — M1990 Unspecified osteoarthritis, unspecified site: Secondary | ICD-10-CM | POA: Diagnosis not present

## 2019-12-02 DIAGNOSIS — E118 Type 2 diabetes mellitus with unspecified complications: Secondary | ICD-10-CM | POA: Diagnosis not present

## 2019-12-02 DIAGNOSIS — M1711 Unilateral primary osteoarthritis, right knee: Secondary | ICD-10-CM | POA: Diagnosis not present

## 2019-12-02 DIAGNOSIS — F05 Delirium due to known physiological condition: Secondary | ICD-10-CM | POA: Diagnosis not present

## 2019-12-02 DIAGNOSIS — D696 Thrombocytopenia, unspecified: Secondary | ICD-10-CM | POA: Diagnosis not present

## 2019-12-02 DIAGNOSIS — N183 Chronic kidney disease, stage 3 unspecified: Secondary | ICD-10-CM | POA: Diagnosis not present

## 2019-12-02 DIAGNOSIS — F0390 Unspecified dementia without behavioral disturbance: Secondary | ICD-10-CM | POA: Diagnosis not present

## 2019-12-02 DIAGNOSIS — Z79891 Long term (current) use of opiate analgesic: Secondary | ICD-10-CM | POA: Diagnosis not present

## 2019-12-02 DIAGNOSIS — Z01810 Encounter for preprocedural cardiovascular examination: Secondary | ICD-10-CM | POA: Diagnosis not present

## 2019-12-02 DIAGNOSIS — M9905 Segmental and somatic dysfunction of pelvic region: Secondary | ICD-10-CM | POA: Diagnosis not present

## 2019-12-02 DIAGNOSIS — R531 Weakness: Secondary | ICD-10-CM | POA: Diagnosis not present

## 2019-12-02 DIAGNOSIS — M549 Dorsalgia, unspecified: Secondary | ICD-10-CM | POA: Diagnosis not present

## 2019-12-02 DIAGNOSIS — Z7689 Persons encountering health services in other specified circumstances: Secondary | ICD-10-CM | POA: Diagnosis not present

## 2019-12-02 DIAGNOSIS — K5100 Ulcerative (chronic) pancolitis without complications: Secondary | ICD-10-CM | POA: Diagnosis not present

## 2019-12-02 DIAGNOSIS — K219 Gastro-esophageal reflux disease without esophagitis: Secondary | ICD-10-CM | POA: Diagnosis not present

## 2019-12-02 DIAGNOSIS — M064 Inflammatory polyarthropathy: Secondary | ICD-10-CM | POA: Diagnosis not present

## 2019-12-02 DIAGNOSIS — E559 Vitamin D deficiency, unspecified: Secondary | ICD-10-CM | POA: Diagnosis not present

## 2019-12-02 DIAGNOSIS — Z125 Encounter for screening for malignant neoplasm of prostate: Secondary | ICD-10-CM | POA: Diagnosis not present

## 2019-12-02 DIAGNOSIS — E119 Type 2 diabetes mellitus without complications: Secondary | ICD-10-CM | POA: Diagnosis not present

## 2019-12-02 DIAGNOSIS — H353121 Nonexudative age-related macular degeneration, left eye, early dry stage: Secondary | ICD-10-CM | POA: Diagnosis not present

## 2019-12-02 DIAGNOSIS — E1122 Type 2 diabetes mellitus with diabetic chronic kidney disease: Secondary | ICD-10-CM | POA: Diagnosis not present

## 2019-12-02 DIAGNOSIS — Z794 Long term (current) use of insulin: Secondary | ICD-10-CM | POA: Diagnosis not present

## 2019-12-02 DIAGNOSIS — E7800 Pure hypercholesterolemia, unspecified: Secondary | ICD-10-CM | POA: Diagnosis not present

## 2019-12-02 DIAGNOSIS — R2689 Other abnormalities of gait and mobility: Secondary | ICD-10-CM | POA: Diagnosis not present

## 2019-12-02 DIAGNOSIS — H35363 Drusen (degenerative) of macula, bilateral: Secondary | ICD-10-CM | POA: Diagnosis not present

## 2019-12-02 DIAGNOSIS — M1009 Idiopathic gout, multiple sites: Secondary | ICD-10-CM | POA: Diagnosis not present

## 2019-12-02 DIAGNOSIS — F3131 Bipolar disorder, current episode depressed, mild: Secondary | ICD-10-CM | POA: Diagnosis not present

## 2019-12-02 DIAGNOSIS — E038 Other specified hypothyroidism: Secondary | ICD-10-CM | POA: Diagnosis not present

## 2019-12-02 DIAGNOSIS — R6520 Severe sepsis without septic shock: Secondary | ICD-10-CM | POA: Diagnosis not present

## 2019-12-02 DIAGNOSIS — I480 Paroxysmal atrial fibrillation: Secondary | ICD-10-CM | POA: Diagnosis not present

## 2019-12-02 DIAGNOSIS — M47816 Spondylosis without myelopathy or radiculopathy, lumbar region: Secondary | ICD-10-CM | POA: Diagnosis not present

## 2019-12-02 DIAGNOSIS — E538 Deficiency of other specified B group vitamins: Secondary | ICD-10-CM | POA: Diagnosis not present

## 2019-12-02 DIAGNOSIS — Z961 Presence of intraocular lens: Secondary | ICD-10-CM | POA: Diagnosis not present

## 2019-12-02 DIAGNOSIS — Q120 Congenital cataract: Secondary | ICD-10-CM | POA: Diagnosis not present

## 2019-12-02 DIAGNOSIS — I5032 Chronic diastolic (congestive) heart failure: Secondary | ICD-10-CM | POA: Diagnosis not present

## 2019-12-02 DIAGNOSIS — M9901 Segmental and somatic dysfunction of cervical region: Secondary | ICD-10-CM | POA: Diagnosis not present

## 2019-12-02 DIAGNOSIS — H353211 Exudative age-related macular degeneration, right eye, with active choroidal neovascularization: Secondary | ICD-10-CM | POA: Diagnosis not present

## 2019-12-02 DIAGNOSIS — R0902 Hypoxemia: Secondary | ICD-10-CM | POA: Diagnosis not present

## 2019-12-02 DIAGNOSIS — Z03818 Encounter for observation for suspected exposure to other biological agents ruled out: Secondary | ICD-10-CM | POA: Diagnosis not present

## 2019-12-02 DIAGNOSIS — Z1231 Encounter for screening mammogram for malignant neoplasm of breast: Secondary | ICD-10-CM | POA: Diagnosis not present

## 2019-12-02 DIAGNOSIS — Z1331 Encounter for screening for depression: Secondary | ICD-10-CM | POA: Diagnosis not present

## 2019-12-02 DIAGNOSIS — R972 Elevated prostate specific antigen [PSA]: Secondary | ICD-10-CM | POA: Diagnosis not present

## 2019-12-02 DIAGNOSIS — M4802 Spinal stenosis, cervical region: Secondary | ICD-10-CM | POA: Diagnosis not present

## 2019-12-02 DIAGNOSIS — Z139 Encounter for screening, unspecified: Secondary | ICD-10-CM | POA: Diagnosis not present

## 2019-12-02 DIAGNOSIS — U071 COVID-19: Secondary | ICD-10-CM | POA: Diagnosis not present

## 2019-12-02 DIAGNOSIS — A419 Sepsis, unspecified organism: Secondary | ICD-10-CM | POA: Diagnosis not present

## 2019-12-02 DIAGNOSIS — R300 Dysuria: Secondary | ICD-10-CM | POA: Diagnosis not present

## 2019-12-02 DIAGNOSIS — D649 Anemia, unspecified: Secondary | ICD-10-CM | POA: Diagnosis not present

## 2019-12-02 DIAGNOSIS — K3184 Gastroparesis: Secondary | ICD-10-CM | POA: Diagnosis not present

## 2019-12-02 DIAGNOSIS — E0590 Thyrotoxicosis, unspecified without thyrotoxic crisis or storm: Secondary | ICD-10-CM | POA: Diagnosis not present

## 2019-12-02 DIAGNOSIS — B9689 Other specified bacterial agents as the cause of diseases classified elsewhere: Secondary | ICD-10-CM | POA: Diagnosis not present

## 2019-12-02 DIAGNOSIS — L4050 Arthropathic psoriasis, unspecified: Secondary | ICD-10-CM | POA: Diagnosis not present

## 2019-12-02 DIAGNOSIS — Z20828 Contact with and (suspected) exposure to other viral communicable diseases: Secondary | ICD-10-CM | POA: Diagnosis not present

## 2019-12-02 DIAGNOSIS — R0600 Dyspnea, unspecified: Secondary | ICD-10-CM | POA: Diagnosis not present

## 2019-12-02 DIAGNOSIS — J9611 Chronic respiratory failure with hypoxia: Secondary | ICD-10-CM | POA: Diagnosis not present

## 2019-12-02 DIAGNOSIS — Z7189 Other specified counseling: Secondary | ICD-10-CM | POA: Diagnosis not present

## 2019-12-02 DIAGNOSIS — G629 Polyneuropathy, unspecified: Secondary | ICD-10-CM | POA: Diagnosis not present

## 2019-12-02 DIAGNOSIS — Z0001 Encounter for general adult medical examination with abnormal findings: Secondary | ICD-10-CM | POA: Diagnosis not present

## 2019-12-02 DIAGNOSIS — E669 Obesity, unspecified: Secondary | ICD-10-CM | POA: Diagnosis not present

## 2019-12-02 DIAGNOSIS — M531 Cervicobrachial syndrome: Secondary | ICD-10-CM | POA: Diagnosis not present

## 2019-12-02 DIAGNOSIS — C73 Malignant neoplasm of thyroid gland: Secondary | ICD-10-CM | POA: Diagnosis not present

## 2019-12-02 DIAGNOSIS — I129 Hypertensive chronic kidney disease with stage 1 through stage 4 chronic kidney disease, or unspecified chronic kidney disease: Secondary | ICD-10-CM | POA: Diagnosis not present

## 2019-12-02 DIAGNOSIS — R82998 Other abnormal findings in urine: Secondary | ICD-10-CM | POA: Diagnosis not present

## 2019-12-02 DIAGNOSIS — M25572 Pain in left ankle and joints of left foot: Secondary | ICD-10-CM | POA: Diagnosis not present

## 2019-12-02 DIAGNOSIS — H401113 Primary open-angle glaucoma, right eye, severe stage: Secondary | ICD-10-CM | POA: Diagnosis not present

## 2019-12-02 DIAGNOSIS — R262 Difficulty in walking, not elsewhere classified: Secondary | ICD-10-CM | POA: Diagnosis not present

## 2019-12-02 DIAGNOSIS — R000 Tachycardia, unspecified: Secondary | ICD-10-CM | POA: Diagnosis not present

## 2019-12-02 DIAGNOSIS — Z7952 Long term (current) use of systemic steroids: Secondary | ICD-10-CM | POA: Diagnosis not present

## 2019-12-02 DIAGNOSIS — M6281 Muscle weakness (generalized): Secondary | ICD-10-CM | POA: Diagnosis not present

## 2019-12-02 DIAGNOSIS — R739 Hyperglycemia, unspecified: Secondary | ICD-10-CM | POA: Diagnosis not present

## 2019-12-02 DIAGNOSIS — M5442 Lumbago with sciatica, left side: Secondary | ICD-10-CM | POA: Diagnosis not present

## 2019-12-02 DIAGNOSIS — E0789 Other specified disorders of thyroid: Secondary | ICD-10-CM | POA: Diagnosis not present

## 2019-12-02 DIAGNOSIS — M5136 Other intervertebral disc degeneration, lumbar region: Secondary | ICD-10-CM | POA: Diagnosis not present

## 2019-12-02 DIAGNOSIS — Z20822 Contact with and (suspected) exposure to covid-19: Secondary | ICD-10-CM | POA: Diagnosis not present

## 2019-12-02 DIAGNOSIS — M79671 Pain in right foot: Secondary | ICD-10-CM | POA: Diagnosis not present

## 2019-12-02 DIAGNOSIS — H43392 Other vitreous opacities, left eye: Secondary | ICD-10-CM | POA: Diagnosis not present

## 2019-12-02 DIAGNOSIS — Z85828 Personal history of other malignant neoplasm of skin: Secondary | ICD-10-CM | POA: Diagnosis not present

## 2019-12-02 DIAGNOSIS — L409 Psoriasis, unspecified: Secondary | ICD-10-CM | POA: Diagnosis not present

## 2019-12-03 DIAGNOSIS — N3281 Overactive bladder: Secondary | ICD-10-CM | POA: Diagnosis not present

## 2019-12-03 DIAGNOSIS — L821 Other seborrheic keratosis: Secondary | ICD-10-CM | POA: Diagnosis not present

## 2019-12-03 DIAGNOSIS — M542 Cervicalgia: Secondary | ICD-10-CM | POA: Diagnosis not present

## 2019-12-03 DIAGNOSIS — Z1211 Encounter for screening for malignant neoplasm of colon: Secondary | ICD-10-CM | POA: Diagnosis not present

## 2019-12-03 DIAGNOSIS — C159 Malignant neoplasm of esophagus, unspecified: Secondary | ICD-10-CM | POA: Diagnosis not present

## 2019-12-03 DIAGNOSIS — M2550 Pain in unspecified joint: Secondary | ICD-10-CM | POA: Diagnosis not present

## 2019-12-03 DIAGNOSIS — M0630 Rheumatoid nodule, unspecified site: Secondary | ICD-10-CM | POA: Diagnosis not present

## 2019-12-03 DIAGNOSIS — N189 Chronic kidney disease, unspecified: Secondary | ICD-10-CM | POA: Diagnosis not present

## 2019-12-03 DIAGNOSIS — Z515 Encounter for palliative care: Secondary | ICD-10-CM | POA: Diagnosis not present

## 2019-12-03 DIAGNOSIS — J019 Acute sinusitis, unspecified: Secondary | ICD-10-CM | POA: Diagnosis not present

## 2019-12-03 DIAGNOSIS — I495 Sick sinus syndrome: Secondary | ICD-10-CM | POA: Diagnosis not present

## 2019-12-03 DIAGNOSIS — Z993 Dependence on wheelchair: Secondary | ICD-10-CM | POA: Diagnosis not present

## 2019-12-03 DIAGNOSIS — J9602 Acute respiratory failure with hypercapnia: Secondary | ICD-10-CM | POA: Diagnosis not present

## 2019-12-03 DIAGNOSIS — R300 Dysuria: Secondary | ICD-10-CM | POA: Diagnosis not present

## 2019-12-03 DIAGNOSIS — H33021 Retinal detachment with multiple breaks, right eye: Secondary | ICD-10-CM | POA: Diagnosis not present

## 2019-12-03 DIAGNOSIS — E611 Iron deficiency: Secondary | ICD-10-CM | POA: Diagnosis not present

## 2019-12-03 DIAGNOSIS — H40013 Open angle with borderline findings, low risk, bilateral: Secondary | ICD-10-CM | POA: Diagnosis not present

## 2019-12-03 DIAGNOSIS — G893 Neoplasm related pain (acute) (chronic): Secondary | ICD-10-CM | POA: Diagnosis not present

## 2019-12-03 DIAGNOSIS — R4182 Altered mental status, unspecified: Secondary | ICD-10-CM | POA: Diagnosis not present

## 2019-12-03 DIAGNOSIS — K285 Chronic or unspecified gastrojejunal ulcer with perforation: Secondary | ICD-10-CM | POA: Diagnosis not present

## 2019-12-03 DIAGNOSIS — C9002 Multiple myeloma in relapse: Secondary | ICD-10-CM | POA: Diagnosis not present

## 2019-12-03 DIAGNOSIS — N179 Acute kidney failure, unspecified: Secondary | ICD-10-CM | POA: Diagnosis not present

## 2019-12-03 DIAGNOSIS — E785 Hyperlipidemia, unspecified: Secondary | ICD-10-CM | POA: Diagnosis not present

## 2019-12-03 DIAGNOSIS — S81802D Unspecified open wound, left lower leg, subsequent encounter: Secondary | ICD-10-CM | POA: Diagnosis not present

## 2019-12-03 DIAGNOSIS — R8290 Unspecified abnormal findings in urine: Secondary | ICD-10-CM | POA: Diagnosis not present

## 2019-12-03 DIAGNOSIS — M1712 Unilateral primary osteoarthritis, left knee: Secondary | ICD-10-CM | POA: Diagnosis not present

## 2019-12-03 DIAGNOSIS — R680 Hypothermia, not associated with low environmental temperature: Secondary | ICD-10-CM | POA: Diagnosis not present

## 2019-12-03 DIAGNOSIS — I10 Essential (primary) hypertension: Secondary | ICD-10-CM | POA: Diagnosis not present

## 2019-12-03 DIAGNOSIS — F329 Major depressive disorder, single episode, unspecified: Secondary | ICD-10-CM | POA: Diagnosis not present

## 2019-12-03 DIAGNOSIS — D485 Neoplasm of uncertain behavior of skin: Secondary | ICD-10-CM | POA: Diagnosis not present

## 2019-12-03 DIAGNOSIS — M6281 Muscle weakness (generalized): Secondary | ICD-10-CM | POA: Diagnosis not present

## 2019-12-03 DIAGNOSIS — D1801 Hemangioma of skin and subcutaneous tissue: Secondary | ICD-10-CM | POA: Diagnosis not present

## 2019-12-03 DIAGNOSIS — Z6839 Body mass index (BMI) 39.0-39.9, adult: Secondary | ICD-10-CM | POA: Diagnosis not present

## 2019-12-03 DIAGNOSIS — Z6827 Body mass index (BMI) 27.0-27.9, adult: Secondary | ICD-10-CM | POA: Diagnosis not present

## 2019-12-03 DIAGNOSIS — M0579 Rheumatoid arthritis with rheumatoid factor of multiple sites without organ or systems involvement: Secondary | ICD-10-CM | POA: Diagnosis not present

## 2019-12-03 DIAGNOSIS — I129 Hypertensive chronic kidney disease with stage 1 through stage 4 chronic kidney disease, or unspecified chronic kidney disease: Secondary | ICD-10-CM | POA: Diagnosis not present

## 2019-12-03 DIAGNOSIS — E1169 Type 2 diabetes mellitus with other specified complication: Secondary | ICD-10-CM | POA: Diagnosis not present

## 2019-12-03 DIAGNOSIS — N390 Urinary tract infection, site not specified: Secondary | ICD-10-CM | POA: Diagnosis not present

## 2019-12-03 DIAGNOSIS — M5414 Radiculopathy, thoracic region: Secondary | ICD-10-CM | POA: Diagnosis not present

## 2019-12-03 DIAGNOSIS — E119 Type 2 diabetes mellitus without complications: Secondary | ICD-10-CM | POA: Diagnosis not present

## 2019-12-03 DIAGNOSIS — R0902 Hypoxemia: Secondary | ICD-10-CM | POA: Diagnosis not present

## 2019-12-03 DIAGNOSIS — R6521 Severe sepsis with septic shock: Secondary | ICD-10-CM | POA: Diagnosis not present

## 2019-12-03 DIAGNOSIS — Z03818 Encounter for observation for suspected exposure to other biological agents ruled out: Secondary | ICD-10-CM | POA: Diagnosis not present

## 2019-12-03 DIAGNOSIS — A419 Sepsis, unspecified organism: Secondary | ICD-10-CM | POA: Diagnosis not present

## 2019-12-03 DIAGNOSIS — I2510 Atherosclerotic heart disease of native coronary artery without angina pectoris: Secondary | ICD-10-CM | POA: Diagnosis not present

## 2019-12-03 DIAGNOSIS — R2681 Unsteadiness on feet: Secondary | ICD-10-CM | POA: Diagnosis not present

## 2019-12-03 DIAGNOSIS — D6859 Other primary thrombophilia: Secondary | ICD-10-CM | POA: Diagnosis not present

## 2019-12-03 DIAGNOSIS — F419 Anxiety disorder, unspecified: Secondary | ICD-10-CM | POA: Diagnosis not present

## 2019-12-03 DIAGNOSIS — E038 Other specified hypothyroidism: Secondary | ICD-10-CM | POA: Diagnosis not present

## 2019-12-03 DIAGNOSIS — I214 Non-ST elevation (NSTEMI) myocardial infarction: Secondary | ICD-10-CM | POA: Diagnosis not present

## 2019-12-03 DIAGNOSIS — E7849 Other hyperlipidemia: Secondary | ICD-10-CM | POA: Diagnosis not present

## 2019-12-03 DIAGNOSIS — R05 Cough: Secondary | ICD-10-CM | POA: Diagnosis not present

## 2019-12-03 DIAGNOSIS — Z9841 Cataract extraction status, right eye: Secondary | ICD-10-CM | POA: Diagnosis not present

## 2019-12-03 DIAGNOSIS — E039 Hypothyroidism, unspecified: Secondary | ICD-10-CM | POA: Diagnosis not present

## 2019-12-03 DIAGNOSIS — R100 Acute abdomen: Secondary | ICD-10-CM | POA: Diagnosis not present

## 2019-12-03 DIAGNOSIS — N200 Calculus of kidney: Secondary | ICD-10-CM | POA: Diagnosis not present

## 2019-12-03 DIAGNOSIS — Z950 Presence of cardiac pacemaker: Secondary | ICD-10-CM | POA: Diagnosis not present

## 2019-12-03 DIAGNOSIS — M059 Rheumatoid arthritis with rheumatoid factor, unspecified: Secondary | ICD-10-CM | POA: Diagnosis not present

## 2019-12-03 DIAGNOSIS — F0391 Unspecified dementia with behavioral disturbance: Secondary | ICD-10-CM | POA: Diagnosis not present

## 2019-12-03 DIAGNOSIS — E871 Hypo-osmolality and hyponatremia: Secondary | ICD-10-CM | POA: Diagnosis not present

## 2019-12-03 DIAGNOSIS — R7309 Other abnormal glucose: Secondary | ICD-10-CM | POA: Diagnosis not present

## 2019-12-03 DIAGNOSIS — E1129 Type 2 diabetes mellitus with other diabetic kidney complication: Secondary | ICD-10-CM | POA: Diagnosis not present

## 2019-12-03 DIAGNOSIS — U071 COVID-19: Secondary | ICD-10-CM | POA: Diagnosis not present

## 2019-12-03 DIAGNOSIS — K668 Other specified disorders of peritoneum: Secondary | ICD-10-CM | POA: Diagnosis not present

## 2019-12-03 DIAGNOSIS — M25551 Pain in right hip: Secondary | ICD-10-CM | POA: Diagnosis not present

## 2019-12-03 DIAGNOSIS — G4730 Sleep apnea, unspecified: Secondary | ICD-10-CM | POA: Diagnosis not present

## 2019-12-03 DIAGNOSIS — G8929 Other chronic pain: Secondary | ICD-10-CM | POA: Diagnosis not present

## 2019-12-03 DIAGNOSIS — E781 Pure hyperglyceridemia: Secondary | ICD-10-CM | POA: Diagnosis not present

## 2019-12-03 DIAGNOSIS — D518 Other vitamin B12 deficiency anemias: Secondary | ICD-10-CM | POA: Diagnosis not present

## 2019-12-03 DIAGNOSIS — J9601 Acute respiratory failure with hypoxia: Secondary | ICD-10-CM | POA: Diagnosis not present

## 2019-12-03 DIAGNOSIS — Z9989 Dependence on other enabling machines and devices: Secondary | ICD-10-CM | POA: Diagnosis not present

## 2019-12-03 DIAGNOSIS — I248 Other forms of acute ischemic heart disease: Secondary | ICD-10-CM | POA: Diagnosis not present

## 2019-12-03 DIAGNOSIS — Z79891 Long term (current) use of opiate analgesic: Secondary | ICD-10-CM | POA: Diagnosis not present

## 2019-12-03 DIAGNOSIS — R7303 Prediabetes: Secondary | ICD-10-CM | POA: Diagnosis not present

## 2019-12-03 DIAGNOSIS — L570 Actinic keratosis: Secondary | ICD-10-CM | POA: Diagnosis not present

## 2019-12-03 DIAGNOSIS — D539 Nutritional anemia, unspecified: Secondary | ICD-10-CM | POA: Diagnosis not present

## 2019-12-03 DIAGNOSIS — R197 Diarrhea, unspecified: Secondary | ICD-10-CM | POA: Diagnosis not present

## 2019-12-03 DIAGNOSIS — N1832 Chronic kidney disease, stage 3b: Secondary | ICD-10-CM | POA: Diagnosis not present

## 2019-12-03 DIAGNOSIS — Z0000 Encounter for general adult medical examination without abnormal findings: Secondary | ICD-10-CM | POA: Diagnosis not present

## 2019-12-03 DIAGNOSIS — E041 Nontoxic single thyroid nodule: Secondary | ICD-10-CM | POA: Diagnosis not present

## 2019-12-03 DIAGNOSIS — F1911 Other psychoactive substance abuse, in remission: Secondary | ICD-10-CM | POA: Diagnosis not present

## 2019-12-03 DIAGNOSIS — R768 Other specified abnormal immunological findings in serum: Secondary | ICD-10-CM | POA: Diagnosis not present

## 2019-12-03 DIAGNOSIS — B9689 Other specified bacterial agents as the cause of diseases classified elsewhere: Secondary | ICD-10-CM | POA: Diagnosis not present

## 2019-12-03 DIAGNOSIS — F17210 Nicotine dependence, cigarettes, uncomplicated: Secondary | ICD-10-CM | POA: Diagnosis not present

## 2019-12-03 DIAGNOSIS — M549 Dorsalgia, unspecified: Secondary | ICD-10-CM | POA: Diagnosis not present

## 2019-12-03 DIAGNOSIS — K219 Gastro-esophageal reflux disease without esophagitis: Secondary | ICD-10-CM | POA: Diagnosis not present

## 2019-12-03 DIAGNOSIS — Z09 Encounter for follow-up examination after completed treatment for conditions other than malignant neoplasm: Secondary | ICD-10-CM | POA: Diagnosis not present

## 2019-12-03 DIAGNOSIS — I252 Old myocardial infarction: Secondary | ICD-10-CM | POA: Diagnosis not present

## 2019-12-03 DIAGNOSIS — R82998 Other abnormal findings in urine: Secondary | ICD-10-CM | POA: Diagnosis not present

## 2019-12-03 DIAGNOSIS — E440 Moderate protein-calorie malnutrition: Secondary | ICD-10-CM | POA: Diagnosis not present

## 2019-12-03 DIAGNOSIS — E782 Mixed hyperlipidemia: Secondary | ICD-10-CM | POA: Diagnosis not present

## 2019-12-03 DIAGNOSIS — R809 Proteinuria, unspecified: Secondary | ICD-10-CM | POA: Diagnosis not present

## 2019-12-03 DIAGNOSIS — E034 Atrophy of thyroid (acquired): Secondary | ICD-10-CM | POA: Diagnosis not present

## 2019-12-03 DIAGNOSIS — R350 Frequency of micturition: Secondary | ICD-10-CM | POA: Diagnosis not present

## 2019-12-03 DIAGNOSIS — I509 Heart failure, unspecified: Secondary | ICD-10-CM | POA: Diagnosis not present

## 2019-12-03 DIAGNOSIS — M5134 Other intervertebral disc degeneration, thoracic region: Secondary | ICD-10-CM | POA: Diagnosis not present

## 2019-12-03 DIAGNOSIS — Z20828 Contact with and (suspected) exposure to other viral communicable diseases: Secondary | ICD-10-CM | POA: Diagnosis not present

## 2019-12-03 DIAGNOSIS — N3941 Urge incontinence: Secondary | ICD-10-CM | POA: Diagnosis not present

## 2019-12-03 DIAGNOSIS — E668 Other obesity: Secondary | ICD-10-CM | POA: Diagnosis not present

## 2019-12-03 DIAGNOSIS — R5383 Other fatigue: Secondary | ICD-10-CM | POA: Diagnosis not present

## 2019-12-03 DIAGNOSIS — Z13220 Encounter for screening for lipoid disorders: Secondary | ICD-10-CM | POA: Diagnosis not present

## 2019-12-03 DIAGNOSIS — E559 Vitamin D deficiency, unspecified: Secondary | ICD-10-CM | POA: Diagnosis not present

## 2019-12-03 DIAGNOSIS — D519 Vitamin B12 deficiency anemia, unspecified: Secondary | ICD-10-CM | POA: Diagnosis not present

## 2019-12-03 DIAGNOSIS — I4891 Unspecified atrial fibrillation: Secondary | ICD-10-CM | POA: Diagnosis not present

## 2019-12-03 DIAGNOSIS — R7301 Impaired fasting glucose: Secondary | ICD-10-CM | POA: Diagnosis not present

## 2019-12-03 DIAGNOSIS — Z9181 History of falling: Secondary | ICD-10-CM | POA: Diagnosis not present

## 2019-12-03 DIAGNOSIS — Z2821 Immunization not carried out because of patient refusal: Secondary | ICD-10-CM | POA: Diagnosis not present

## 2019-12-03 DIAGNOSIS — Z6841 Body Mass Index (BMI) 40.0 and over, adult: Secondary | ICD-10-CM | POA: Diagnosis not present

## 2019-12-03 DIAGNOSIS — E1122 Type 2 diabetes mellitus with diabetic chronic kidney disease: Secondary | ICD-10-CM | POA: Diagnosis not present

## 2019-12-03 DIAGNOSIS — M316 Other giant cell arteritis: Secondary | ICD-10-CM | POA: Diagnosis not present

## 2019-12-03 DIAGNOSIS — Z79899 Other long term (current) drug therapy: Secondary | ICD-10-CM | POA: Diagnosis not present

## 2019-12-03 DIAGNOSIS — Z7689 Persons encountering health services in other specified circumstances: Secondary | ICD-10-CM | POA: Diagnosis not present

## 2019-12-03 DIAGNOSIS — N3001 Acute cystitis with hematuria: Secondary | ICD-10-CM | POA: Diagnosis not present

## 2019-12-03 DIAGNOSIS — Z66 Do not resuscitate: Secondary | ICD-10-CM | POA: Diagnosis not present

## 2019-12-03 DIAGNOSIS — J1282 Pneumonia due to coronavirus disease 2019: Secondary | ICD-10-CM | POA: Diagnosis not present

## 2019-12-03 DIAGNOSIS — E872 Acidosis: Secondary | ICD-10-CM | POA: Diagnosis not present

## 2019-12-04 DIAGNOSIS — U071 COVID-19: Secondary | ICD-10-CM | POA: Diagnosis not present

## 2019-12-04 DIAGNOSIS — D6859 Other primary thrombophilia: Secondary | ICD-10-CM | POA: Diagnosis not present

## 2019-12-04 DIAGNOSIS — Z20828 Contact with and (suspected) exposure to other viral communicable diseases: Secondary | ICD-10-CM | POA: Diagnosis not present

## 2019-12-04 DIAGNOSIS — A0472 Enterocolitis due to Clostridium difficile, not specified as recurrent: Secondary | ICD-10-CM | POA: Diagnosis not present

## 2019-12-04 DIAGNOSIS — J9601 Acute respiratory failure with hypoxia: Secondary | ICD-10-CM | POA: Diagnosis not present

## 2019-12-04 DIAGNOSIS — D649 Anemia, unspecified: Secondary | ICD-10-CM | POA: Diagnosis not present

## 2019-12-04 DIAGNOSIS — R531 Weakness: Secondary | ICD-10-CM | POA: Diagnosis not present

## 2019-12-04 DIAGNOSIS — J449 Chronic obstructive pulmonary disease, unspecified: Secondary | ICD-10-CM | POA: Diagnosis not present

## 2019-12-04 DIAGNOSIS — Z9981 Dependence on supplemental oxygen: Secondary | ICD-10-CM | POA: Diagnosis not present

## 2019-12-04 DIAGNOSIS — S72002A Fracture of unspecified part of neck of left femur, initial encounter for closed fracture: Secondary | ICD-10-CM | POA: Diagnosis not present

## 2019-12-04 DIAGNOSIS — I10 Essential (primary) hypertension: Secondary | ICD-10-CM | POA: Diagnosis not present

## 2019-12-04 DIAGNOSIS — Z03818 Encounter for observation for suspected exposure to other biological agents ruled out: Secondary | ICD-10-CM | POA: Diagnosis not present

## 2019-12-04 DIAGNOSIS — E785 Hyperlipidemia, unspecified: Secondary | ICD-10-CM | POA: Diagnosis not present

## 2019-12-05 DIAGNOSIS — J189 Pneumonia, unspecified organism: Secondary | ICD-10-CM | POA: Diagnosis not present

## 2019-12-05 DIAGNOSIS — R0602 Shortness of breath: Secondary | ICD-10-CM | POA: Diagnosis not present

## 2019-12-05 DIAGNOSIS — Z20822 Contact with and (suspected) exposure to covid-19: Secondary | ICD-10-CM | POA: Diagnosis not present

## 2019-12-05 DIAGNOSIS — I959 Hypotension, unspecified: Secondary | ICD-10-CM | POA: Diagnosis not present

## 2019-12-05 DIAGNOSIS — R300 Dysuria: Secondary | ICD-10-CM | POA: Diagnosis not present

## 2019-12-05 DIAGNOSIS — M791 Myalgia, unspecified site: Secondary | ICD-10-CM | POA: Diagnosis not present

## 2019-12-05 DIAGNOSIS — R519 Headache, unspecified: Secondary | ICD-10-CM | POA: Diagnosis not present

## 2019-12-05 DIAGNOSIS — G9340 Encephalopathy, unspecified: Secondary | ICD-10-CM | POA: Diagnosis not present

## 2019-12-05 DIAGNOSIS — I509 Heart failure, unspecified: Secondary | ICD-10-CM | POA: Diagnosis not present

## 2019-12-05 DIAGNOSIS — J9601 Acute respiratory failure with hypoxia: Secondary | ICD-10-CM | POA: Diagnosis not present

## 2019-12-05 DIAGNOSIS — Z20828 Contact with and (suspected) exposure to other viral communicable diseases: Secondary | ICD-10-CM | POA: Diagnosis not present

## 2019-12-05 DIAGNOSIS — Z1211 Encounter for screening for malignant neoplasm of colon: Secondary | ICD-10-CM | POA: Diagnosis not present

## 2019-12-05 DIAGNOSIS — R197 Diarrhea, unspecified: Secondary | ICD-10-CM | POA: Diagnosis not present

## 2019-12-05 DIAGNOSIS — I4891 Unspecified atrial fibrillation: Secondary | ICD-10-CM | POA: Diagnosis not present

## 2019-12-06 DIAGNOSIS — Z20822 Contact with and (suspected) exposure to covid-19: Secondary | ICD-10-CM | POA: Diagnosis not present

## 2019-12-06 DIAGNOSIS — N201 Calculus of ureter: Secondary | ICD-10-CM | POA: Diagnosis not present

## 2019-12-06 DIAGNOSIS — H9201 Otalgia, right ear: Secondary | ICD-10-CM | POA: Diagnosis not present

## 2019-12-06 DIAGNOSIS — H35363 Drusen (degenerative) of macula, bilateral: Secondary | ICD-10-CM | POA: Diagnosis not present

## 2019-12-06 DIAGNOSIS — I130 Hypertensive heart and chronic kidney disease with heart failure and stage 1 through stage 4 chronic kidney disease, or unspecified chronic kidney disease: Secondary | ICD-10-CM | POA: Diagnosis not present

## 2019-12-06 DIAGNOSIS — F1120 Opioid dependence, uncomplicated: Secondary | ICD-10-CM | POA: Diagnosis not present

## 2019-12-06 DIAGNOSIS — U071 COVID-19: Secondary | ICD-10-CM | POA: Diagnosis not present

## 2019-12-06 DIAGNOSIS — M5387 Other specified dorsopathies, lumbosacral region: Secondary | ICD-10-CM | POA: Diagnosis not present

## 2019-12-06 DIAGNOSIS — M6281 Muscle weakness (generalized): Secondary | ICD-10-CM | POA: Diagnosis not present

## 2019-12-06 DIAGNOSIS — R5382 Chronic fatigue, unspecified: Secondary | ICD-10-CM | POA: Diagnosis not present

## 2019-12-06 DIAGNOSIS — N289 Disorder of kidney and ureter, unspecified: Secondary | ICD-10-CM | POA: Diagnosis not present

## 2019-12-06 DIAGNOSIS — R070 Pain in throat: Secondary | ICD-10-CM | POA: Diagnosis not present

## 2019-12-06 DIAGNOSIS — R112 Nausea with vomiting, unspecified: Secondary | ICD-10-CM | POA: Diagnosis not present

## 2019-12-06 DIAGNOSIS — I4891 Unspecified atrial fibrillation: Secondary | ICD-10-CM | POA: Diagnosis not present

## 2019-12-06 DIAGNOSIS — M797 Fibromyalgia: Secondary | ICD-10-CM | POA: Diagnosis not present

## 2019-12-06 DIAGNOSIS — Z09 Encounter for follow-up examination after completed treatment for conditions other than malignant neoplasm: Secondary | ICD-10-CM | POA: Diagnosis not present

## 2019-12-06 DIAGNOSIS — Z7952 Long term (current) use of systemic steroids: Secondary | ICD-10-CM | POA: Diagnosis not present

## 2019-12-06 DIAGNOSIS — E663 Overweight: Secondary | ICD-10-CM | POA: Diagnosis not present

## 2019-12-06 DIAGNOSIS — R634 Abnormal weight loss: Secondary | ICD-10-CM | POA: Diagnosis not present

## 2019-12-06 DIAGNOSIS — H25812 Combined forms of age-related cataract, left eye: Secondary | ICD-10-CM | POA: Diagnosis not present

## 2019-12-06 DIAGNOSIS — H353211 Exudative age-related macular degeneration, right eye, with active choroidal neovascularization: Secondary | ICD-10-CM | POA: Diagnosis not present

## 2019-12-06 DIAGNOSIS — N390 Urinary tract infection, site not specified: Secondary | ICD-10-CM | POA: Diagnosis not present

## 2019-12-06 DIAGNOSIS — Z6835 Body mass index (BMI) 35.0-35.9, adult: Secondary | ICD-10-CM | POA: Diagnosis not present

## 2019-12-06 DIAGNOSIS — Q675 Congenital deformity of spine: Secondary | ICD-10-CM | POA: Diagnosis not present

## 2019-12-06 DIAGNOSIS — E1165 Type 2 diabetes mellitus with hyperglycemia: Secondary | ICD-10-CM | POA: Diagnosis not present

## 2019-12-06 DIAGNOSIS — R197 Diarrhea, unspecified: Secondary | ICD-10-CM | POA: Diagnosis not present

## 2019-12-06 DIAGNOSIS — Z9189 Other specified personal risk factors, not elsewhere classified: Secondary | ICD-10-CM | POA: Diagnosis not present

## 2019-12-06 DIAGNOSIS — E669 Obesity, unspecified: Secondary | ICD-10-CM | POA: Diagnosis not present

## 2019-12-06 DIAGNOSIS — N951 Menopausal and female climacteric states: Secondary | ICD-10-CM | POA: Diagnosis not present

## 2019-12-06 DIAGNOSIS — I2699 Other pulmonary embolism without acute cor pulmonale: Secondary | ICD-10-CM | POA: Diagnosis not present

## 2019-12-06 DIAGNOSIS — Z0000 Encounter for general adult medical examination without abnormal findings: Secondary | ICD-10-CM | POA: Diagnosis not present

## 2019-12-06 DIAGNOSIS — F1320 Sedative, hypnotic or anxiolytic dependence, uncomplicated: Secondary | ICD-10-CM | POA: Diagnosis not present

## 2019-12-06 DIAGNOSIS — M0609 Rheumatoid arthritis without rheumatoid factor, multiple sites: Secondary | ICD-10-CM | POA: Diagnosis not present

## 2019-12-06 DIAGNOSIS — Z125 Encounter for screening for malignant neoplasm of prostate: Secondary | ICD-10-CM | POA: Diagnosis not present

## 2019-12-06 DIAGNOSIS — R279 Unspecified lack of coordination: Secondary | ICD-10-CM | POA: Diagnosis not present

## 2019-12-06 DIAGNOSIS — N1832 Chronic kidney disease, stage 3b: Secondary | ICD-10-CM | POA: Diagnosis not present

## 2019-12-06 DIAGNOSIS — G933 Postviral fatigue syndrome: Secondary | ICD-10-CM | POA: Diagnosis not present

## 2019-12-06 DIAGNOSIS — R42 Dizziness and giddiness: Secondary | ICD-10-CM | POA: Diagnosis not present

## 2019-12-06 DIAGNOSIS — R718 Other abnormality of red blood cells: Secondary | ICD-10-CM | POA: Diagnosis not present

## 2019-12-06 DIAGNOSIS — M25552 Pain in left hip: Secondary | ICD-10-CM | POA: Diagnosis not present

## 2019-12-06 DIAGNOSIS — E890 Postprocedural hypothyroidism: Secondary | ICD-10-CM | POA: Diagnosis not present

## 2019-12-06 DIAGNOSIS — Z1212 Encounter for screening for malignant neoplasm of rectum: Secondary | ICD-10-CM | POA: Diagnosis not present

## 2019-12-06 DIAGNOSIS — G894 Chronic pain syndrome: Secondary | ICD-10-CM | POA: Diagnosis not present

## 2019-12-06 DIAGNOSIS — Z79899 Other long term (current) drug therapy: Secondary | ICD-10-CM | POA: Diagnosis not present

## 2019-12-06 DIAGNOSIS — Z7401 Bed confinement status: Secondary | ICD-10-CM | POA: Diagnosis not present

## 2019-12-06 DIAGNOSIS — S72012D Unspecified intracapsular fracture of left femur, subsequent encounter for closed fracture with routine healing: Secondary | ICD-10-CM | POA: Diagnosis not present

## 2019-12-06 DIAGNOSIS — N952 Postmenopausal atrophic vaginitis: Secondary | ICD-10-CM | POA: Diagnosis not present

## 2019-12-06 DIAGNOSIS — N730 Acute parametritis and pelvic cellulitis: Secondary | ICD-10-CM | POA: Diagnosis not present

## 2019-12-06 DIAGNOSIS — K760 Fatty (change of) liver, not elsewhere classified: Secondary | ICD-10-CM | POA: Diagnosis not present

## 2019-12-06 DIAGNOSIS — R972 Elevated prostate specific antigen [PSA]: Secondary | ICD-10-CM | POA: Diagnosis not present

## 2019-12-06 DIAGNOSIS — M7989 Other specified soft tissue disorders: Secondary | ICD-10-CM | POA: Diagnosis not present

## 2019-12-06 DIAGNOSIS — N1831 Chronic kidney disease, stage 3a: Secondary | ICD-10-CM | POA: Diagnosis not present

## 2019-12-06 DIAGNOSIS — D122 Benign neoplasm of ascending colon: Secondary | ICD-10-CM | POA: Diagnosis not present

## 2019-12-06 DIAGNOSIS — M25551 Pain in right hip: Secondary | ICD-10-CM | POA: Diagnosis not present

## 2019-12-06 DIAGNOSIS — N179 Acute kidney failure, unspecified: Secondary | ICD-10-CM | POA: Diagnosis not present

## 2019-12-06 DIAGNOSIS — R739 Hyperglycemia, unspecified: Secondary | ICD-10-CM | POA: Diagnosis not present

## 2019-12-06 DIAGNOSIS — Z0001 Encounter for general adult medical examination with abnormal findings: Secondary | ICD-10-CM | POA: Diagnosis not present

## 2019-12-06 DIAGNOSIS — H43813 Vitreous degeneration, bilateral: Secondary | ICD-10-CM | POA: Diagnosis not present

## 2019-12-06 DIAGNOSIS — N3021 Other chronic cystitis with hematuria: Secondary | ICD-10-CM | POA: Diagnosis not present

## 2019-12-06 DIAGNOSIS — H40051 Ocular hypertension, right eye: Secondary | ICD-10-CM | POA: Diagnosis not present

## 2019-12-06 DIAGNOSIS — K921 Melena: Secondary | ICD-10-CM | POA: Diagnosis not present

## 2019-12-06 DIAGNOSIS — I69398 Other sequelae of cerebral infarction: Secondary | ICD-10-CM | POA: Diagnosis not present

## 2019-12-06 DIAGNOSIS — M329 Systemic lupus erythematosus, unspecified: Secondary | ICD-10-CM | POA: Diagnosis not present

## 2019-12-06 DIAGNOSIS — M1990 Unspecified osteoarthritis, unspecified site: Secondary | ICD-10-CM | POA: Diagnosis not present

## 2019-12-06 DIAGNOSIS — F900 Attention-deficit hyperactivity disorder, predominantly inattentive type: Secondary | ICD-10-CM | POA: Diagnosis not present

## 2019-12-06 DIAGNOSIS — I10 Essential (primary) hypertension: Secondary | ICD-10-CM | POA: Diagnosis not present

## 2019-12-06 DIAGNOSIS — E1122 Type 2 diabetes mellitus with diabetic chronic kidney disease: Secondary | ICD-10-CM | POA: Diagnosis not present

## 2019-12-06 DIAGNOSIS — M9902 Segmental and somatic dysfunction of thoracic region: Secondary | ICD-10-CM | POA: Diagnosis not present

## 2019-12-06 DIAGNOSIS — M5090 Cervical disc disorder, unspecified, unspecified cervical region: Secondary | ICD-10-CM | POA: Diagnosis not present

## 2019-12-06 DIAGNOSIS — M79642 Pain in left hand: Secondary | ICD-10-CM | POA: Diagnosis not present

## 2019-12-06 DIAGNOSIS — K219 Gastro-esophageal reflux disease without esophagitis: Secondary | ICD-10-CM | POA: Diagnosis not present

## 2019-12-06 DIAGNOSIS — H34831 Tributary (branch) retinal vein occlusion, right eye, with macular edema: Secondary | ICD-10-CM | POA: Diagnosis not present

## 2019-12-06 DIAGNOSIS — Z96641 Presence of right artificial hip joint: Secondary | ICD-10-CM | POA: Diagnosis not present

## 2019-12-06 DIAGNOSIS — M129 Arthropathy, unspecified: Secondary | ICD-10-CM | POA: Diagnosis not present

## 2019-12-06 DIAGNOSIS — Z9229 Personal history of other drug therapy: Secondary | ICD-10-CM | POA: Diagnosis not present

## 2019-12-06 DIAGNOSIS — Z124 Encounter for screening for malignant neoplasm of cervix: Secondary | ICD-10-CM | POA: Diagnosis not present

## 2019-12-06 DIAGNOSIS — F429 Obsessive-compulsive disorder, unspecified: Secondary | ICD-10-CM | POA: Diagnosis not present

## 2019-12-06 DIAGNOSIS — I2510 Atherosclerotic heart disease of native coronary artery without angina pectoris: Secondary | ICD-10-CM | POA: Diagnosis not present

## 2019-12-06 DIAGNOSIS — Z6823 Body mass index (BMI) 23.0-23.9, adult: Secondary | ICD-10-CM | POA: Diagnosis not present

## 2019-12-06 DIAGNOSIS — R8290 Unspecified abnormal findings in urine: Secondary | ICD-10-CM | POA: Diagnosis not present

## 2019-12-06 DIAGNOSIS — F411 Generalized anxiety disorder: Secondary | ICD-10-CM | POA: Diagnosis not present

## 2019-12-06 DIAGNOSIS — M9904 Segmental and somatic dysfunction of sacral region: Secondary | ICD-10-CM | POA: Diagnosis not present

## 2019-12-06 DIAGNOSIS — D509 Iron deficiency anemia, unspecified: Secondary | ICD-10-CM | POA: Diagnosis not present

## 2019-12-06 DIAGNOSIS — M5416 Radiculopathy, lumbar region: Secondary | ICD-10-CM | POA: Diagnosis not present

## 2019-12-06 DIAGNOSIS — F4312 Post-traumatic stress disorder, chronic: Secondary | ICD-10-CM | POA: Diagnosis not present

## 2019-12-06 DIAGNOSIS — R109 Unspecified abdominal pain: Secondary | ICD-10-CM | POA: Diagnosis not present

## 2019-12-06 DIAGNOSIS — E860 Dehydration: Secondary | ICD-10-CM | POA: Diagnosis not present

## 2019-12-06 DIAGNOSIS — H35371 Puckering of macula, right eye: Secondary | ICD-10-CM | POA: Diagnosis not present

## 2019-12-06 DIAGNOSIS — E042 Nontoxic multinodular goiter: Secondary | ICD-10-CM | POA: Diagnosis not present

## 2019-12-06 DIAGNOSIS — E119 Type 2 diabetes mellitus without complications: Secondary | ICD-10-CM | POA: Diagnosis not present

## 2019-12-06 DIAGNOSIS — E6609 Other obesity due to excess calories: Secondary | ICD-10-CM | POA: Diagnosis not present

## 2019-12-06 DIAGNOSIS — M5412 Radiculopathy, cervical region: Secondary | ICD-10-CM | POA: Diagnosis not present

## 2019-12-06 DIAGNOSIS — R319 Hematuria, unspecified: Secondary | ICD-10-CM | POA: Diagnosis not present

## 2019-12-06 DIAGNOSIS — R269 Unspecified abnormalities of gait and mobility: Secondary | ICD-10-CM | POA: Diagnosis not present

## 2019-12-06 DIAGNOSIS — Z923 Personal history of irradiation: Secondary | ICD-10-CM | POA: Diagnosis not present

## 2019-12-06 DIAGNOSIS — E785 Hyperlipidemia, unspecified: Secondary | ICD-10-CM | POA: Diagnosis not present

## 2019-12-06 DIAGNOSIS — N184 Chronic kidney disease, stage 4 (severe): Secondary | ICD-10-CM | POA: Diagnosis not present

## 2019-12-06 DIAGNOSIS — E7800 Pure hypercholesterolemia, unspecified: Secondary | ICD-10-CM | POA: Diagnosis not present

## 2019-12-06 DIAGNOSIS — J441 Chronic obstructive pulmonary disease with (acute) exacerbation: Secondary | ICD-10-CM | POA: Diagnosis not present

## 2019-12-06 DIAGNOSIS — F419 Anxiety disorder, unspecified: Secondary | ICD-10-CM | POA: Diagnosis not present

## 2019-12-06 DIAGNOSIS — Z9981 Dependence on supplemental oxygen: Secondary | ICD-10-CM | POA: Diagnosis not present

## 2019-12-06 DIAGNOSIS — E1169 Type 2 diabetes mellitus with other specified complication: Secondary | ICD-10-CM | POA: Diagnosis not present

## 2019-12-06 DIAGNOSIS — E1151 Type 2 diabetes mellitus with diabetic peripheral angiopathy without gangrene: Secondary | ICD-10-CM | POA: Diagnosis not present

## 2019-12-06 DIAGNOSIS — D518 Other vitamin B12 deficiency anemias: Secondary | ICD-10-CM | POA: Diagnosis not present

## 2019-12-06 DIAGNOSIS — N183 Chronic kidney disease, stage 3 unspecified: Secondary | ICD-10-CM | POA: Diagnosis not present

## 2019-12-06 DIAGNOSIS — I959 Hypotension, unspecified: Secondary | ICD-10-CM | POA: Diagnosis not present

## 2019-12-06 DIAGNOSIS — E049 Nontoxic goiter, unspecified: Secondary | ICD-10-CM | POA: Diagnosis not present

## 2019-12-06 DIAGNOSIS — D631 Anemia in chronic kidney disease: Secondary | ICD-10-CM | POA: Diagnosis not present

## 2019-12-06 DIAGNOSIS — D3000 Benign neoplasm of unspecified kidney: Secondary | ICD-10-CM | POA: Diagnosis not present

## 2019-12-06 DIAGNOSIS — F0280 Dementia in other diseases classified elsewhere without behavioral disturbance: Secondary | ICD-10-CM | POA: Diagnosis not present

## 2019-12-06 DIAGNOSIS — R7989 Other specified abnormal findings of blood chemistry: Secondary | ICD-10-CM | POA: Diagnosis not present

## 2019-12-06 DIAGNOSIS — J029 Acute pharyngitis, unspecified: Secondary | ICD-10-CM | POA: Diagnosis not present

## 2019-12-06 DIAGNOSIS — N401 Enlarged prostate with lower urinary tract symptoms: Secondary | ICD-10-CM | POA: Diagnosis not present

## 2019-12-06 DIAGNOSIS — H35372 Puckering of macula, left eye: Secondary | ICD-10-CM | POA: Diagnosis not present

## 2019-12-06 DIAGNOSIS — Z86711 Personal history of pulmonary embolism: Secondary | ICD-10-CM | POA: Diagnosis not present

## 2019-12-06 DIAGNOSIS — Z8546 Personal history of malignant neoplasm of prostate: Secondary | ICD-10-CM | POA: Diagnosis not present

## 2019-12-06 DIAGNOSIS — M531 Cervicobrachial syndrome: Secondary | ICD-10-CM | POA: Diagnosis not present

## 2019-12-06 DIAGNOSIS — H33191 Other retinoschisis and retinal cysts, right eye: Secondary | ICD-10-CM | POA: Diagnosis not present

## 2019-12-06 DIAGNOSIS — J440 Chronic obstructive pulmonary disease with acute lower respiratory infection: Secondary | ICD-10-CM | POA: Diagnosis not present

## 2019-12-06 DIAGNOSIS — Z13220 Encounter for screening for lipoid disorders: Secondary | ICD-10-CM | POA: Diagnosis not present

## 2019-12-06 DIAGNOSIS — E0590 Thyrotoxicosis, unspecified without thyrotoxic crisis or storm: Secondary | ICD-10-CM | POA: Diagnosis not present

## 2019-12-06 DIAGNOSIS — F331 Major depressive disorder, recurrent, moderate: Secondary | ICD-10-CM | POA: Diagnosis not present

## 2019-12-06 DIAGNOSIS — Z743 Need for continuous supervision: Secondary | ICD-10-CM | POA: Diagnosis not present

## 2019-12-06 DIAGNOSIS — L659 Nonscarring hair loss, unspecified: Secondary | ICD-10-CM | POA: Diagnosis not present

## 2019-12-06 DIAGNOSIS — F3173 Bipolar disorder, in partial remission, most recent episode manic: Secondary | ICD-10-CM | POA: Diagnosis not present

## 2019-12-06 DIAGNOSIS — H6011 Cellulitis of right external ear: Secondary | ICD-10-CM | POA: Diagnosis not present

## 2019-12-06 DIAGNOSIS — G300 Alzheimer's disease with early onset: Secondary | ICD-10-CM | POA: Diagnosis not present

## 2019-12-06 DIAGNOSIS — R7303 Prediabetes: Secondary | ICD-10-CM | POA: Diagnosis not present

## 2019-12-06 DIAGNOSIS — I69354 Hemiplegia and hemiparesis following cerebral infarction affecting left non-dominant side: Secondary | ICD-10-CM | POA: Diagnosis not present

## 2019-12-06 DIAGNOSIS — R300 Dysuria: Secondary | ICD-10-CM | POA: Diagnosis not present

## 2019-12-06 DIAGNOSIS — M7582 Other shoulder lesions, left shoulder: Secondary | ICD-10-CM | POA: Diagnosis not present

## 2019-12-06 DIAGNOSIS — J9601 Acute respiratory failure with hypoxia: Secondary | ICD-10-CM | POA: Diagnosis not present

## 2019-12-06 DIAGNOSIS — M545 Low back pain: Secondary | ICD-10-CM | POA: Diagnosis not present

## 2019-12-06 DIAGNOSIS — M19012 Primary osteoarthritis, left shoulder: Secondary | ICD-10-CM | POA: Diagnosis not present

## 2019-12-06 DIAGNOSIS — R5383 Other fatigue: Secondary | ICD-10-CM | POA: Diagnosis not present

## 2019-12-06 DIAGNOSIS — N2581 Secondary hyperparathyroidism of renal origin: Secondary | ICD-10-CM | POA: Diagnosis not present

## 2019-12-06 DIAGNOSIS — D513 Other dietary vitamin B12 deficiency anemia: Secondary | ICD-10-CM | POA: Diagnosis not present

## 2019-12-06 DIAGNOSIS — C61 Malignant neoplasm of prostate: Secondary | ICD-10-CM | POA: Diagnosis not present

## 2019-12-06 DIAGNOSIS — I998 Other disorder of circulatory system: Secondary | ICD-10-CM | POA: Diagnosis not present

## 2019-12-06 DIAGNOSIS — M5030 Other cervical disc degeneration, unspecified cervical region: Secondary | ICD-10-CM | POA: Diagnosis not present

## 2019-12-06 DIAGNOSIS — R2681 Unsteadiness on feet: Secondary | ICD-10-CM | POA: Diagnosis not present

## 2019-12-06 DIAGNOSIS — E1142 Type 2 diabetes mellitus with diabetic polyneuropathy: Secondary | ICD-10-CM | POA: Diagnosis not present

## 2019-12-06 DIAGNOSIS — D72829 Elevated white blood cell count, unspecified: Secondary | ICD-10-CM | POA: Diagnosis not present

## 2019-12-06 DIAGNOSIS — Z7689 Persons encountering health services in other specified circumstances: Secondary | ICD-10-CM | POA: Diagnosis not present

## 2019-12-06 DIAGNOSIS — M2550 Pain in unspecified joint: Secondary | ICD-10-CM | POA: Diagnosis not present

## 2019-12-06 DIAGNOSIS — R82998 Other abnormal findings in urine: Secondary | ICD-10-CM | POA: Diagnosis not present

## 2019-12-06 DIAGNOSIS — G4700 Insomnia, unspecified: Secondary | ICD-10-CM | POA: Diagnosis not present

## 2019-12-06 DIAGNOSIS — Z8719 Personal history of other diseases of the digestive system: Secondary | ICD-10-CM | POA: Diagnosis not present

## 2019-12-06 DIAGNOSIS — M8580 Other specified disorders of bone density and structure, unspecified site: Secondary | ICD-10-CM | POA: Diagnosis not present

## 2019-12-06 DIAGNOSIS — E876 Hypokalemia: Secondary | ICD-10-CM | POA: Diagnosis not present

## 2019-12-06 DIAGNOSIS — K626 Ulcer of anus and rectum: Secondary | ICD-10-CM | POA: Diagnosis not present

## 2019-12-06 DIAGNOSIS — Z8619 Personal history of other infectious and parasitic diseases: Secondary | ICD-10-CM | POA: Diagnosis not present

## 2019-12-06 DIAGNOSIS — Z96612 Presence of left artificial shoulder joint: Secondary | ICD-10-CM | POA: Diagnosis not present

## 2019-12-06 DIAGNOSIS — F3113 Bipolar disorder, current episode manic without psychotic features, severe: Secondary | ICD-10-CM | POA: Diagnosis not present

## 2019-12-06 DIAGNOSIS — Z6826 Body mass index (BMI) 26.0-26.9, adult: Secondary | ICD-10-CM | POA: Diagnosis not present

## 2019-12-06 DIAGNOSIS — D6851 Activated protein C resistance: Secondary | ICD-10-CM | POA: Diagnosis not present

## 2019-12-06 DIAGNOSIS — I82412 Acute embolism and thrombosis of left femoral vein: Secondary | ICD-10-CM | POA: Diagnosis not present

## 2019-12-06 DIAGNOSIS — M9903 Segmental and somatic dysfunction of lumbar region: Secondary | ICD-10-CM | POA: Diagnosis not present

## 2019-12-06 DIAGNOSIS — E538 Deficiency of other specified B group vitamins: Secondary | ICD-10-CM | POA: Diagnosis not present

## 2019-12-06 DIAGNOSIS — E559 Vitamin D deficiency, unspecified: Secondary | ICD-10-CM | POA: Diagnosis not present

## 2019-12-06 DIAGNOSIS — E039 Hypothyroidism, unspecified: Secondary | ICD-10-CM | POA: Diagnosis not present

## 2019-12-06 DIAGNOSIS — R000 Tachycardia, unspecified: Secondary | ICD-10-CM | POA: Diagnosis not present

## 2019-12-06 DIAGNOSIS — M75112 Incomplete rotator cuff tear or rupture of left shoulder, not specified as traumatic: Secondary | ICD-10-CM | POA: Diagnosis not present

## 2019-12-06 DIAGNOSIS — M818 Other osteoporosis without current pathological fracture: Secondary | ICD-10-CM | POA: Diagnosis not present

## 2019-12-06 DIAGNOSIS — M9901 Segmental and somatic dysfunction of cervical region: Secondary | ICD-10-CM | POA: Diagnosis not present

## 2019-12-06 DIAGNOSIS — Z0184 Encounter for antibody response examination: Secondary | ICD-10-CM | POA: Diagnosis not present

## 2019-12-06 DIAGNOSIS — R0600 Dyspnea, unspecified: Secondary | ICD-10-CM | POA: Diagnosis not present

## 2019-12-06 DIAGNOSIS — Z1159 Encounter for screening for other viral diseases: Secondary | ICD-10-CM | POA: Diagnosis not present

## 2019-12-06 DIAGNOSIS — R350 Frequency of micturition: Secondary | ICD-10-CM | POA: Diagnosis not present

## 2019-12-06 DIAGNOSIS — E1121 Type 2 diabetes mellitus with diabetic nephropathy: Secondary | ICD-10-CM | POA: Diagnosis not present

## 2019-12-06 DIAGNOSIS — M47816 Spondylosis without myelopathy or radiculopathy, lumbar region: Secondary | ICD-10-CM | POA: Diagnosis not present

## 2019-12-06 DIAGNOSIS — Z5181 Encounter for therapeutic drug level monitoring: Secondary | ICD-10-CM | POA: Diagnosis not present

## 2019-12-06 DIAGNOSIS — E1129 Type 2 diabetes mellitus with other diabetic kidney complication: Secondary | ICD-10-CM | POA: Diagnosis not present

## 2019-12-06 DIAGNOSIS — Z79891 Long term (current) use of opiate analgesic: Secondary | ICD-10-CM | POA: Diagnosis not present

## 2019-12-06 DIAGNOSIS — R351 Nocturia: Secondary | ICD-10-CM | POA: Diagnosis not present

## 2019-12-06 DIAGNOSIS — Z981 Arthrodesis status: Secondary | ICD-10-CM | POA: Diagnosis not present

## 2019-12-06 DIAGNOSIS — Z23 Encounter for immunization: Secondary | ICD-10-CM | POA: Diagnosis not present

## 2019-12-06 DIAGNOSIS — N3001 Acute cystitis with hematuria: Secondary | ICD-10-CM | POA: Diagnosis not present

## 2019-12-06 DIAGNOSIS — M546 Pain in thoracic spine: Secondary | ICD-10-CM | POA: Diagnosis not present

## 2019-12-06 DIAGNOSIS — I5020 Unspecified systolic (congestive) heart failure: Secondary | ICD-10-CM | POA: Diagnosis not present

## 2019-12-06 DIAGNOSIS — D6859 Other primary thrombophilia: Secondary | ICD-10-CM | POA: Diagnosis not present

## 2019-12-06 DIAGNOSIS — R945 Abnormal results of liver function studies: Secondary | ICD-10-CM | POA: Diagnosis not present

## 2019-12-06 DIAGNOSIS — E782 Mixed hyperlipidemia: Secondary | ICD-10-CM | POA: Diagnosis not present

## 2019-12-06 DIAGNOSIS — R05 Cough: Secondary | ICD-10-CM | POA: Diagnosis not present

## 2019-12-06 DIAGNOSIS — H2511 Age-related nuclear cataract, right eye: Secondary | ICD-10-CM | POA: Diagnosis not present

## 2019-12-06 DIAGNOSIS — C50412 Malignant neoplasm of upper-outer quadrant of left female breast: Secondary | ICD-10-CM | POA: Diagnosis not present

## 2019-12-06 DIAGNOSIS — D124 Benign neoplasm of descending colon: Secondary | ICD-10-CM | POA: Diagnosis not present

## 2019-12-06 DIAGNOSIS — M791 Myalgia, unspecified site: Secondary | ICD-10-CM | POA: Diagnosis not present

## 2019-12-06 DIAGNOSIS — Z9079 Acquired absence of other genital organ(s): Secondary | ICD-10-CM | POA: Diagnosis not present

## 2019-12-06 DIAGNOSIS — M5136 Other intervertebral disc degeneration, lumbar region: Secondary | ICD-10-CM | POA: Diagnosis not present

## 2019-12-06 DIAGNOSIS — M47812 Spondylosis without myelopathy or radiculopathy, cervical region: Secondary | ICD-10-CM | POA: Diagnosis not present

## 2019-12-06 DIAGNOSIS — D649 Anemia, unspecified: Secondary | ICD-10-CM | POA: Diagnosis not present

## 2019-12-06 DIAGNOSIS — R609 Edema, unspecified: Secondary | ICD-10-CM | POA: Diagnosis not present

## 2019-12-06 DIAGNOSIS — K627 Radiation proctitis: Secondary | ICD-10-CM | POA: Diagnosis not present

## 2019-12-06 DIAGNOSIS — G4733 Obstructive sleep apnea (adult) (pediatric): Secondary | ICD-10-CM | POA: Diagnosis not present

## 2019-12-06 DIAGNOSIS — M48061 Spinal stenosis, lumbar region without neurogenic claudication: Secondary | ICD-10-CM | POA: Diagnosis not present

## 2019-12-06 DIAGNOSIS — M353 Polymyalgia rheumatica: Secondary | ICD-10-CM | POA: Diagnosis not present

## 2019-12-06 DIAGNOSIS — Z9889 Other specified postprocedural states: Secondary | ICD-10-CM | POA: Diagnosis not present

## 2019-12-06 DIAGNOSIS — K5100 Ulcerative (chronic) pancolitis without complications: Secondary | ICD-10-CM | POA: Diagnosis not present

## 2019-12-06 DIAGNOSIS — Z1389 Encounter for screening for other disorder: Secondary | ICD-10-CM | POA: Diagnosis not present

## 2019-12-06 DIAGNOSIS — Z20828 Contact with and (suspected) exposure to other viral communicable diseases: Secondary | ICD-10-CM | POA: Diagnosis not present

## 2019-12-06 DIAGNOSIS — J449 Chronic obstructive pulmonary disease, unspecified: Secondary | ICD-10-CM | POA: Diagnosis not present

## 2019-12-06 DIAGNOSIS — M4004 Postural kyphosis, thoracic region: Secondary | ICD-10-CM | POA: Diagnosis not present

## 2019-12-06 DIAGNOSIS — M79672 Pain in left foot: Secondary | ICD-10-CM | POA: Diagnosis not present

## 2019-12-07 DIAGNOSIS — H9190 Unspecified hearing loss, unspecified ear: Secondary | ICD-10-CM | POA: Diagnosis not present

## 2019-12-07 DIAGNOSIS — M810 Age-related osteoporosis without current pathological fracture: Secondary | ICD-10-CM | POA: Diagnosis not present

## 2019-12-07 DIAGNOSIS — C61 Malignant neoplasm of prostate: Secondary | ICD-10-CM | POA: Diagnosis not present

## 2019-12-07 DIAGNOSIS — L03115 Cellulitis of right lower limb: Secondary | ICD-10-CM | POA: Diagnosis not present

## 2019-12-07 DIAGNOSIS — I4891 Unspecified atrial fibrillation: Secondary | ICD-10-CM | POA: Diagnosis not present

## 2019-12-07 DIAGNOSIS — R918 Other nonspecific abnormal finding of lung field: Secondary | ICD-10-CM | POA: Diagnosis not present

## 2019-12-07 DIAGNOSIS — G459 Transient cerebral ischemic attack, unspecified: Secondary | ICD-10-CM | POA: Diagnosis not present

## 2019-12-07 DIAGNOSIS — D593 Hemolytic-uremic syndrome: Secondary | ICD-10-CM | POA: Diagnosis not present

## 2019-12-07 DIAGNOSIS — I872 Venous insufficiency (chronic) (peripheral): Secondary | ICD-10-CM | POA: Diagnosis not present

## 2019-12-07 DIAGNOSIS — I110 Hypertensive heart disease with heart failure: Secondary | ICD-10-CM | POA: Diagnosis not present

## 2019-12-07 DIAGNOSIS — Z13220 Encounter for screening for lipoid disorders: Secondary | ICD-10-CM | POA: Diagnosis not present

## 2019-12-07 DIAGNOSIS — E1169 Type 2 diabetes mellitus with other specified complication: Secondary | ICD-10-CM | POA: Diagnosis not present

## 2019-12-07 DIAGNOSIS — R7301 Impaired fasting glucose: Secondary | ICD-10-CM | POA: Diagnosis not present

## 2019-12-07 DIAGNOSIS — Z6841 Body Mass Index (BMI) 40.0 and over, adult: Secondary | ICD-10-CM | POA: Diagnosis not present

## 2019-12-07 DIAGNOSIS — F419 Anxiety disorder, unspecified: Secondary | ICD-10-CM | POA: Diagnosis not present

## 2019-12-07 DIAGNOSIS — L680 Hirsutism: Secondary | ICD-10-CM | POA: Diagnosis not present

## 2019-12-07 DIAGNOSIS — E1165 Type 2 diabetes mellitus with hyperglycemia: Secondary | ICD-10-CM | POA: Diagnosis not present

## 2019-12-07 DIAGNOSIS — E1140 Type 2 diabetes mellitus with diabetic neuropathy, unspecified: Secondary | ICD-10-CM | POA: Diagnosis not present

## 2019-12-07 DIAGNOSIS — C182 Malignant neoplasm of ascending colon: Secondary | ICD-10-CM | POA: Diagnosis not present

## 2019-12-07 DIAGNOSIS — I739 Peripheral vascular disease, unspecified: Secondary | ICD-10-CM | POA: Diagnosis not present

## 2019-12-07 DIAGNOSIS — E669 Obesity, unspecified: Secondary | ICD-10-CM | POA: Diagnosis not present

## 2019-12-07 DIAGNOSIS — R82998 Other abnormal findings in urine: Secondary | ICD-10-CM | POA: Diagnosis not present

## 2019-12-07 DIAGNOSIS — R498 Other voice and resonance disorders: Secondary | ICD-10-CM | POA: Diagnosis not present

## 2019-12-07 DIAGNOSIS — M109 Gout, unspecified: Secondary | ICD-10-CM | POA: Diagnosis not present

## 2019-12-07 DIAGNOSIS — R5382 Chronic fatigue, unspecified: Secondary | ICD-10-CM | POA: Diagnosis not present

## 2019-12-07 DIAGNOSIS — D582 Other hemoglobinopathies: Secondary | ICD-10-CM | POA: Diagnosis not present

## 2019-12-07 DIAGNOSIS — Z03818 Encounter for observation for suspected exposure to other biological agents ruled out: Secondary | ICD-10-CM | POA: Diagnosis not present

## 2019-12-07 DIAGNOSIS — R0602 Shortness of breath: Secondary | ICD-10-CM | POA: Diagnosis not present

## 2019-12-07 DIAGNOSIS — E876 Hypokalemia: Secondary | ICD-10-CM | POA: Diagnosis not present

## 2019-12-07 DIAGNOSIS — T7840XA Allergy, unspecified, initial encounter: Secondary | ICD-10-CM | POA: Diagnosis not present

## 2019-12-07 DIAGNOSIS — D689 Coagulation defect, unspecified: Secondary | ICD-10-CM | POA: Diagnosis not present

## 2019-12-07 DIAGNOSIS — H26493 Other secondary cataract, bilateral: Secondary | ICD-10-CM | POA: Diagnosis not present

## 2019-12-07 DIAGNOSIS — D071 Carcinoma in situ of vulva: Secondary | ICD-10-CM | POA: Diagnosis not present

## 2019-12-07 DIAGNOSIS — R42 Dizziness and giddiness: Secondary | ICD-10-CM | POA: Diagnosis not present

## 2019-12-07 DIAGNOSIS — Z0184 Encounter for antibody response examination: Secondary | ICD-10-CM | POA: Diagnosis not present

## 2019-12-07 DIAGNOSIS — Z944 Liver transplant status: Secondary | ICD-10-CM | POA: Diagnosis not present

## 2019-12-07 DIAGNOSIS — G9341 Metabolic encephalopathy: Secondary | ICD-10-CM | POA: Diagnosis not present

## 2019-12-07 DIAGNOSIS — R5381 Other malaise: Secondary | ICD-10-CM | POA: Diagnosis not present

## 2019-12-07 DIAGNOSIS — E871 Hypo-osmolality and hyponatremia: Secondary | ICD-10-CM | POA: Diagnosis not present

## 2019-12-07 DIAGNOSIS — I482 Chronic atrial fibrillation, unspecified: Secondary | ICD-10-CM | POA: Diagnosis not present

## 2019-12-07 DIAGNOSIS — Z96651 Presence of right artificial knee joint: Secondary | ICD-10-CM | POA: Diagnosis not present

## 2019-12-07 DIAGNOSIS — R1011 Right upper quadrant pain: Secondary | ICD-10-CM | POA: Diagnosis not present

## 2019-12-07 DIAGNOSIS — Z6825 Body mass index (BMI) 25.0-25.9, adult: Secondary | ICD-10-CM | POA: Diagnosis not present

## 2019-12-07 DIAGNOSIS — J989 Respiratory disorder, unspecified: Secondary | ICD-10-CM | POA: Diagnosis not present

## 2019-12-07 DIAGNOSIS — I2584 Coronary atherosclerosis due to calcified coronary lesion: Secondary | ICD-10-CM | POA: Diagnosis not present

## 2019-12-07 DIAGNOSIS — M79662 Pain in left lower leg: Secondary | ICD-10-CM | POA: Diagnosis not present

## 2019-12-07 DIAGNOSIS — L03116 Cellulitis of left lower limb: Secondary | ICD-10-CM | POA: Diagnosis not present

## 2019-12-07 DIAGNOSIS — D519 Vitamin B12 deficiency anemia, unspecified: Secondary | ICD-10-CM | POA: Diagnosis not present

## 2019-12-07 DIAGNOSIS — M5417 Radiculopathy, lumbosacral region: Secondary | ICD-10-CM | POA: Diagnosis not present

## 2019-12-07 DIAGNOSIS — E785 Hyperlipidemia, unspecified: Secondary | ICD-10-CM | POA: Diagnosis not present

## 2019-12-07 DIAGNOSIS — Z6839 Body mass index (BMI) 39.0-39.9, adult: Secondary | ICD-10-CM | POA: Diagnosis not present

## 2019-12-07 DIAGNOSIS — N183 Chronic kidney disease, stage 3 unspecified: Secondary | ICD-10-CM | POA: Diagnosis not present

## 2019-12-07 DIAGNOSIS — R972 Elevated prostate specific antigen [PSA]: Secondary | ICD-10-CM | POA: Diagnosis not present

## 2019-12-07 DIAGNOSIS — E538 Deficiency of other specified B group vitamins: Secondary | ICD-10-CM | POA: Diagnosis not present

## 2019-12-07 DIAGNOSIS — E860 Dehydration: Secondary | ICD-10-CM | POA: Diagnosis not present

## 2019-12-07 DIAGNOSIS — M6281 Muscle weakness (generalized): Secondary | ICD-10-CM | POA: Diagnosis not present

## 2019-12-07 DIAGNOSIS — J449 Chronic obstructive pulmonary disease, unspecified: Secondary | ICD-10-CM | POA: Diagnosis not present

## 2019-12-07 DIAGNOSIS — D62 Acute posthemorrhagic anemia: Secondary | ICD-10-CM | POA: Diagnosis not present

## 2019-12-07 DIAGNOSIS — Z23 Encounter for immunization: Secondary | ICD-10-CM | POA: Diagnosis not present

## 2019-12-07 DIAGNOSIS — J9621 Acute and chronic respiratory failure with hypoxia: Secondary | ICD-10-CM | POA: Diagnosis not present

## 2019-12-07 DIAGNOSIS — R7989 Other specified abnormal findings of blood chemistry: Secondary | ICD-10-CM | POA: Diagnosis not present

## 2019-12-07 DIAGNOSIS — N179 Acute kidney failure, unspecified: Secondary | ICD-10-CM | POA: Diagnosis not present

## 2019-12-07 DIAGNOSIS — Z7901 Long term (current) use of anticoagulants: Secondary | ICD-10-CM | POA: Diagnosis not present

## 2019-12-07 DIAGNOSIS — N939 Abnormal uterine and vaginal bleeding, unspecified: Secondary | ICD-10-CM | POA: Diagnosis not present

## 2019-12-07 DIAGNOSIS — Z1159 Encounter for screening for other viral diseases: Secondary | ICD-10-CM | POA: Diagnosis not present

## 2019-12-07 DIAGNOSIS — S7223XA Displaced subtrochanteric fracture of unspecified femur, initial encounter for closed fracture: Secondary | ICD-10-CM | POA: Diagnosis not present

## 2019-12-07 DIAGNOSIS — N184 Chronic kidney disease, stage 4 (severe): Secondary | ICD-10-CM | POA: Diagnosis not present

## 2019-12-07 DIAGNOSIS — Z13228 Encounter for screening for other metabolic disorders: Secondary | ICD-10-CM | POA: Diagnosis not present

## 2019-12-07 DIAGNOSIS — Z951 Presence of aortocoronary bypass graft: Secondary | ICD-10-CM | POA: Diagnosis not present

## 2019-12-07 DIAGNOSIS — M9904 Segmental and somatic dysfunction of sacral region: Secondary | ICD-10-CM | POA: Diagnosis not present

## 2019-12-07 DIAGNOSIS — L4050 Arthropathic psoriasis, unspecified: Secondary | ICD-10-CM | POA: Diagnosis not present

## 2019-12-07 DIAGNOSIS — K5900 Constipation, unspecified: Secondary | ICD-10-CM | POA: Diagnosis not present

## 2019-12-07 DIAGNOSIS — N538 Other male sexual dysfunction: Secondary | ICD-10-CM | POA: Diagnosis not present

## 2019-12-07 DIAGNOSIS — Z20828 Contact with and (suspected) exposure to other viral communicable diseases: Secondary | ICD-10-CM | POA: Diagnosis not present

## 2019-12-07 DIAGNOSIS — I200 Unstable angina: Secondary | ICD-10-CM | POA: Diagnosis not present

## 2019-12-07 DIAGNOSIS — U071 COVID-19: Secondary | ICD-10-CM | POA: Diagnosis not present

## 2019-12-07 DIAGNOSIS — M0579 Rheumatoid arthritis with rheumatoid factor of multiple sites without organ or systems involvement: Secondary | ICD-10-CM | POA: Diagnosis not present

## 2019-12-07 DIAGNOSIS — E039 Hypothyroidism, unspecified: Secondary | ICD-10-CM | POA: Diagnosis not present

## 2019-12-07 DIAGNOSIS — J441 Chronic obstructive pulmonary disease with (acute) exacerbation: Secondary | ICD-10-CM | POA: Diagnosis not present

## 2019-12-07 DIAGNOSIS — I2699 Other pulmonary embolism without acute cor pulmonale: Secondary | ICD-10-CM | POA: Diagnosis not present

## 2019-12-07 DIAGNOSIS — R3915 Urgency of urination: Secondary | ICD-10-CM | POA: Diagnosis not present

## 2019-12-07 DIAGNOSIS — D6959 Other secondary thrombocytopenia: Secondary | ICD-10-CM | POA: Diagnosis not present

## 2019-12-07 DIAGNOSIS — Z48298 Encounter for aftercare following other organ transplant: Secondary | ICD-10-CM | POA: Diagnosis not present

## 2019-12-07 DIAGNOSIS — R8281 Pyuria: Secondary | ICD-10-CM | POA: Diagnosis not present

## 2019-12-07 DIAGNOSIS — I5043 Acute on chronic combined systolic (congestive) and diastolic (congestive) heart failure: Secondary | ICD-10-CM | POA: Diagnosis not present

## 2019-12-07 DIAGNOSIS — M47816 Spondylosis without myelopathy or radiculopathy, lumbar region: Secondary | ICD-10-CM | POA: Diagnosis not present

## 2019-12-07 DIAGNOSIS — Z91030 Bee allergy status: Secondary | ICD-10-CM | POA: Diagnosis not present

## 2019-12-07 DIAGNOSIS — Z131 Encounter for screening for diabetes mellitus: Secondary | ICD-10-CM | POA: Diagnosis not present

## 2019-12-07 DIAGNOSIS — M9903 Segmental and somatic dysfunction of lumbar region: Secondary | ICD-10-CM | POA: Diagnosis not present

## 2019-12-07 DIAGNOSIS — E210 Primary hyperparathyroidism: Secondary | ICD-10-CM | POA: Diagnosis not present

## 2019-12-07 DIAGNOSIS — M25562 Pain in left knee: Secondary | ICD-10-CM | POA: Diagnosis not present

## 2019-12-07 DIAGNOSIS — N951 Menopausal and female climacteric states: Secondary | ICD-10-CM | POA: Diagnosis not present

## 2019-12-07 DIAGNOSIS — M4307 Spondylolysis, lumbosacral region: Secondary | ICD-10-CM | POA: Diagnosis not present

## 2019-12-07 DIAGNOSIS — R351 Nocturia: Secondary | ICD-10-CM | POA: Diagnosis not present

## 2019-12-07 DIAGNOSIS — E049 Nontoxic goiter, unspecified: Secondary | ICD-10-CM | POA: Diagnosis not present

## 2019-12-07 DIAGNOSIS — E559 Vitamin D deficiency, unspecified: Secondary | ICD-10-CM | POA: Diagnosis not present

## 2019-12-07 DIAGNOSIS — N893 Dysplasia of vagina, unspecified: Secondary | ICD-10-CM | POA: Diagnosis not present

## 2019-12-07 DIAGNOSIS — F909 Attention-deficit hyperactivity disorder, unspecified type: Secondary | ICD-10-CM | POA: Diagnosis not present

## 2019-12-07 DIAGNOSIS — R413 Other amnesia: Secondary | ICD-10-CM | POA: Diagnosis not present

## 2019-12-07 DIAGNOSIS — J189 Pneumonia, unspecified organism: Secondary | ICD-10-CM | POA: Diagnosis not present

## 2019-12-07 DIAGNOSIS — N3020 Other chronic cystitis without hematuria: Secondary | ICD-10-CM | POA: Diagnosis not present

## 2019-12-07 DIAGNOSIS — R531 Weakness: Secondary | ICD-10-CM | POA: Diagnosis not present

## 2019-12-07 DIAGNOSIS — Z1212 Encounter for screening for malignant neoplasm of rectum: Secondary | ICD-10-CM | POA: Diagnosis not present

## 2019-12-07 DIAGNOSIS — J9601 Acute respiratory failure with hypoxia: Secondary | ICD-10-CM | POA: Diagnosis not present

## 2019-12-07 DIAGNOSIS — R579 Shock, unspecified: Secondary | ICD-10-CM | POA: Diagnosis not present

## 2019-12-07 DIAGNOSIS — N189 Chronic kidney disease, unspecified: Secondary | ICD-10-CM | POA: Diagnosis not present

## 2019-12-07 DIAGNOSIS — R2681 Unsteadiness on feet: Secondary | ICD-10-CM | POA: Diagnosis not present

## 2019-12-07 DIAGNOSIS — R300 Dysuria: Secondary | ICD-10-CM | POA: Diagnosis not present

## 2019-12-07 DIAGNOSIS — R8290 Unspecified abnormal findings in urine: Secondary | ICD-10-CM | POA: Diagnosis not present

## 2019-12-07 DIAGNOSIS — D509 Iron deficiency anemia, unspecified: Secondary | ICD-10-CM | POA: Diagnosis not present

## 2019-12-07 DIAGNOSIS — J1282 Pneumonia due to coronavirus disease 2019: Secondary | ICD-10-CM | POA: Diagnosis not present

## 2019-12-07 DIAGNOSIS — T82855A Stenosis of coronary artery stent, initial encounter: Secondary | ICD-10-CM | POA: Diagnosis not present

## 2019-12-07 DIAGNOSIS — N3946 Mixed incontinence: Secondary | ICD-10-CM | POA: Diagnosis not present

## 2019-12-07 DIAGNOSIS — E46 Unspecified protein-calorie malnutrition: Secondary | ICD-10-CM | POA: Diagnosis not present

## 2019-12-07 DIAGNOSIS — R609 Edema, unspecified: Secondary | ICD-10-CM | POA: Diagnosis not present

## 2019-12-07 DIAGNOSIS — R030 Elevated blood-pressure reading, without diagnosis of hypertension: Secondary | ICD-10-CM | POA: Diagnosis not present

## 2019-12-07 DIAGNOSIS — D7589 Other specified diseases of blood and blood-forming organs: Secondary | ICD-10-CM | POA: Diagnosis not present

## 2019-12-07 DIAGNOSIS — I5022 Chronic systolic (congestive) heart failure: Secondary | ICD-10-CM | POA: Diagnosis not present

## 2019-12-07 DIAGNOSIS — Z789 Other specified health status: Secondary | ICD-10-CM | POA: Diagnosis not present

## 2019-12-07 DIAGNOSIS — I129 Hypertensive chronic kidney disease with stage 1 through stage 4 chronic kidney disease, or unspecified chronic kidney disease: Secondary | ICD-10-CM | POA: Diagnosis not present

## 2019-12-07 DIAGNOSIS — Z1389 Encounter for screening for other disorder: Secondary | ICD-10-CM | POA: Diagnosis not present

## 2019-12-07 DIAGNOSIS — E663 Overweight: Secondary | ICD-10-CM | POA: Diagnosis not present

## 2019-12-07 DIAGNOSIS — D649 Anemia, unspecified: Secondary | ICD-10-CM | POA: Diagnosis not present

## 2019-12-07 DIAGNOSIS — N3000 Acute cystitis without hematuria: Secondary | ICD-10-CM | POA: Diagnosis not present

## 2019-12-07 DIAGNOSIS — M1711 Unilateral primary osteoarthritis, right knee: Secondary | ICD-10-CM | POA: Diagnosis not present

## 2019-12-07 DIAGNOSIS — Z87440 Personal history of urinary (tract) infections: Secondary | ICD-10-CM | POA: Diagnosis not present

## 2019-12-07 DIAGNOSIS — N390 Urinary tract infection, site not specified: Secondary | ICD-10-CM | POA: Diagnosis not present

## 2019-12-07 DIAGNOSIS — I25118 Atherosclerotic heart disease of native coronary artery with other forms of angina pectoris: Secondary | ICD-10-CM | POA: Diagnosis not present

## 2019-12-07 DIAGNOSIS — I252 Old myocardial infarction: Secondary | ICD-10-CM | POA: Diagnosis not present

## 2019-12-07 DIAGNOSIS — N289 Disorder of kidney and ureter, unspecified: Secondary | ICD-10-CM | POA: Diagnosis not present

## 2019-12-07 DIAGNOSIS — F0390 Unspecified dementia without behavioral disturbance: Secondary | ICD-10-CM | POA: Diagnosis not present

## 2019-12-07 DIAGNOSIS — I25810 Atherosclerosis of coronary artery bypass graft(s) without angina pectoris: Secondary | ICD-10-CM | POA: Diagnosis not present

## 2019-12-07 DIAGNOSIS — S86911A Strain of unspecified muscle(s) and tendon(s) at lower leg level, right leg, initial encounter: Secondary | ICD-10-CM | POA: Diagnosis not present

## 2019-12-07 DIAGNOSIS — S72141A Displaced intertrochanteric fracture of right femur, initial encounter for closed fracture: Secondary | ICD-10-CM | POA: Diagnosis not present

## 2019-12-07 DIAGNOSIS — Z79899 Other long term (current) drug therapy: Secondary | ICD-10-CM | POA: Diagnosis not present

## 2019-12-07 DIAGNOSIS — E118 Type 2 diabetes mellitus with unspecified complications: Secondary | ICD-10-CM | POA: Diagnosis not present

## 2019-12-07 DIAGNOSIS — N1832 Chronic kidney disease, stage 3b: Secondary | ICD-10-CM | POA: Diagnosis not present

## 2019-12-07 DIAGNOSIS — I2510 Atherosclerotic heart disease of native coronary artery without angina pectoris: Secondary | ICD-10-CM | POA: Diagnosis not present

## 2019-12-07 DIAGNOSIS — Z9181 History of falling: Secondary | ICD-10-CM | POA: Diagnosis not present

## 2019-12-07 DIAGNOSIS — N138 Other obstructive and reflux uropathy: Secondary | ICD-10-CM | POA: Diagnosis not present

## 2019-12-07 DIAGNOSIS — Z9289 Personal history of other medical treatment: Secondary | ICD-10-CM | POA: Diagnosis not present

## 2019-12-07 DIAGNOSIS — Z01818 Encounter for other preprocedural examination: Secondary | ICD-10-CM | POA: Diagnosis not present

## 2019-12-07 DIAGNOSIS — J9811 Atelectasis: Secondary | ICD-10-CM | POA: Diagnosis not present

## 2019-12-07 DIAGNOSIS — T797XXA Traumatic subcutaneous emphysema, initial encounter: Secondary | ICD-10-CM | POA: Diagnosis not present

## 2019-12-07 DIAGNOSIS — N2581 Secondary hyperparathyroidism of renal origin: Secondary | ICD-10-CM | POA: Diagnosis not present

## 2019-12-07 DIAGNOSIS — I214 Non-ST elevation (NSTEMI) myocardial infarction: Secondary | ICD-10-CM | POA: Diagnosis not present

## 2019-12-07 DIAGNOSIS — A0472 Enterocolitis due to Clostridium difficile, not specified as recurrent: Secondary | ICD-10-CM | POA: Diagnosis not present

## 2019-12-07 DIAGNOSIS — Z9981 Dependence on supplemental oxygen: Secondary | ICD-10-CM | POA: Diagnosis not present

## 2019-12-07 DIAGNOSIS — R87610 Atypical squamous cells of undetermined significance on cytologic smear of cervix (ASC-US): Secondary | ICD-10-CM | POA: Diagnosis not present

## 2019-12-07 DIAGNOSIS — D61818 Other pancytopenia: Secondary | ICD-10-CM | POA: Diagnosis not present

## 2019-12-07 DIAGNOSIS — N201 Calculus of ureter: Secondary | ICD-10-CM | POA: Diagnosis not present

## 2019-12-07 DIAGNOSIS — E1122 Type 2 diabetes mellitus with diabetic chronic kidney disease: Secondary | ICD-10-CM | POA: Diagnosis not present

## 2019-12-07 DIAGNOSIS — R1013 Epigastric pain: Secondary | ICD-10-CM | POA: Diagnosis not present

## 2019-12-07 DIAGNOSIS — E782 Mixed hyperlipidemia: Secondary | ICD-10-CM | POA: Diagnosis not present

## 2019-12-07 DIAGNOSIS — T7802XD Anaphylactic reaction due to shellfish (crustaceans), subsequent encounter: Secondary | ICD-10-CM | POA: Diagnosis not present

## 2019-12-07 DIAGNOSIS — J9383 Other pneumothorax: Secondary | ICD-10-CM | POA: Diagnosis not present

## 2019-12-07 DIAGNOSIS — W19XXXA Unspecified fall, initial encounter: Secondary | ICD-10-CM | POA: Diagnosis not present

## 2019-12-07 DIAGNOSIS — N182 Chronic kidney disease, stage 2 (mild): Secondary | ICD-10-CM | POA: Diagnosis not present

## 2019-12-07 DIAGNOSIS — I4892 Unspecified atrial flutter: Secondary | ICD-10-CM | POA: Diagnosis not present

## 2019-12-07 DIAGNOSIS — R739 Hyperglycemia, unspecified: Secondary | ICD-10-CM | POA: Diagnosis not present

## 2019-12-07 DIAGNOSIS — R109 Unspecified abdominal pain: Secondary | ICD-10-CM | POA: Diagnosis not present

## 2019-12-07 DIAGNOSIS — D6489 Other specified anemias: Secondary | ICD-10-CM | POA: Diagnosis not present

## 2019-12-07 DIAGNOSIS — Z7689 Persons encountering health services in other specified circumstances: Secondary | ICD-10-CM | POA: Diagnosis not present

## 2019-12-07 DIAGNOSIS — E875 Hyperkalemia: Secondary | ICD-10-CM | POA: Diagnosis not present

## 2019-12-07 DIAGNOSIS — Z8619 Personal history of other infectious and parasitic diseases: Secondary | ICD-10-CM | POA: Diagnosis not present

## 2019-12-07 DIAGNOSIS — S8991XA Unspecified injury of right lower leg, initial encounter: Secondary | ICD-10-CM | POA: Diagnosis not present

## 2019-12-07 DIAGNOSIS — R601 Generalized edema: Secondary | ICD-10-CM | POA: Diagnosis not present

## 2019-12-07 DIAGNOSIS — Z940 Kidney transplant status: Secondary | ICD-10-CM | POA: Diagnosis not present

## 2019-12-07 DIAGNOSIS — Z1211 Encounter for screening for malignant neoplasm of colon: Secondary | ICD-10-CM | POA: Diagnosis not present

## 2019-12-07 DIAGNOSIS — G6289 Other specified polyneuropathies: Secondary | ICD-10-CM | POA: Diagnosis not present

## 2019-12-07 DIAGNOSIS — M545 Low back pain: Secondary | ICD-10-CM | POA: Diagnosis not present

## 2019-12-07 DIAGNOSIS — Z0000 Encounter for general adult medical examination without abnormal findings: Secondary | ICD-10-CM | POA: Diagnosis not present

## 2019-12-07 DIAGNOSIS — R809 Proteinuria, unspecified: Secondary | ICD-10-CM | POA: Diagnosis not present

## 2019-12-07 DIAGNOSIS — I480 Paroxysmal atrial fibrillation: Secondary | ICD-10-CM | POA: Diagnosis not present

## 2019-12-07 DIAGNOSIS — F064 Anxiety disorder due to known physiological condition: Secondary | ICD-10-CM | POA: Diagnosis not present

## 2019-12-07 DIAGNOSIS — R0600 Dyspnea, unspecified: Secondary | ICD-10-CM | POA: Diagnosis not present

## 2019-12-07 DIAGNOSIS — F341 Dysthymic disorder: Secondary | ICD-10-CM | POA: Diagnosis not present

## 2019-12-07 DIAGNOSIS — E7800 Pure hypercholesterolemia, unspecified: Secondary | ICD-10-CM | POA: Diagnosis not present

## 2019-12-07 DIAGNOSIS — T84011A Broken internal left hip prosthesis, initial encounter: Secondary | ICD-10-CM | POA: Diagnosis not present

## 2019-12-07 DIAGNOSIS — D631 Anemia in chronic kidney disease: Secondary | ICD-10-CM | POA: Diagnosis not present

## 2019-12-07 DIAGNOSIS — M25561 Pain in right knee: Secondary | ICD-10-CM | POA: Diagnosis not present

## 2019-12-07 DIAGNOSIS — I10 Essential (primary) hypertension: Secondary | ICD-10-CM | POA: Diagnosis not present

## 2019-12-07 DIAGNOSIS — K219 Gastro-esophageal reflux disease without esophagitis: Secondary | ICD-10-CM | POA: Diagnosis not present

## 2019-12-07 DIAGNOSIS — G2581 Restless legs syndrome: Secondary | ICD-10-CM | POA: Diagnosis not present

## 2019-12-07 DIAGNOSIS — F3340 Major depressive disorder, recurrent, in remission, unspecified: Secondary | ICD-10-CM | POA: Diagnosis not present

## 2019-12-07 DIAGNOSIS — F329 Major depressive disorder, single episode, unspecified: Secondary | ICD-10-CM | POA: Diagnosis not present

## 2019-12-07 DIAGNOSIS — E119 Type 2 diabetes mellitus without complications: Secondary | ICD-10-CM | POA: Diagnosis not present

## 2019-12-07 DIAGNOSIS — N401 Enlarged prostate with lower urinary tract symptoms: Secondary | ICD-10-CM | POA: Diagnosis not present

## 2019-12-07 DIAGNOSIS — Z20822 Contact with and (suspected) exposure to covid-19: Secondary | ICD-10-CM | POA: Diagnosis not present

## 2019-12-07 DIAGNOSIS — L239 Allergic contact dermatitis, unspecified cause: Secondary | ICD-10-CM | POA: Diagnosis not present

## 2019-12-07 DIAGNOSIS — K8020 Calculus of gallbladder without cholecystitis without obstruction: Secondary | ICD-10-CM | POA: Diagnosis not present

## 2019-12-07 DIAGNOSIS — M179 Osteoarthritis of knee, unspecified: Secondary | ICD-10-CM | POA: Diagnosis not present

## 2019-12-07 DIAGNOSIS — K580 Irritable bowel syndrome with diarrhea: Secondary | ICD-10-CM | POA: Diagnosis not present

## 2019-12-07 DIAGNOSIS — A4101 Sepsis due to Methicillin susceptible Staphylococcus aureus: Secondary | ICD-10-CM | POA: Diagnosis not present

## 2019-12-07 DIAGNOSIS — N3001 Acute cystitis with hematuria: Secondary | ICD-10-CM | POA: Diagnosis not present

## 2019-12-07 DIAGNOSIS — M0609 Rheumatoid arthritis without rheumatoid factor, multiple sites: Secondary | ICD-10-CM | POA: Diagnosis not present

## 2019-12-08 DIAGNOSIS — R8281 Pyuria: Secondary | ICD-10-CM | POA: Diagnosis not present

## 2019-12-08 DIAGNOSIS — R972 Elevated prostate specific antigen [PSA]: Secondary | ICD-10-CM | POA: Diagnosis not present

## 2019-12-08 DIAGNOSIS — Z853 Personal history of malignant neoplasm of breast: Secondary | ICD-10-CM | POA: Diagnosis not present

## 2019-12-08 DIAGNOSIS — R197 Diarrhea, unspecified: Secondary | ICD-10-CM | POA: Diagnosis not present

## 2019-12-08 DIAGNOSIS — E781 Pure hyperglyceridemia: Secondary | ICD-10-CM | POA: Diagnosis not present

## 2019-12-08 DIAGNOSIS — E669 Obesity, unspecified: Secondary | ICD-10-CM | POA: Diagnosis not present

## 2019-12-08 DIAGNOSIS — R739 Hyperglycemia, unspecified: Secondary | ICD-10-CM | POA: Diagnosis not present

## 2019-12-08 DIAGNOSIS — D120 Benign neoplasm of cecum: Secondary | ICD-10-CM | POA: Diagnosis not present

## 2019-12-08 DIAGNOSIS — Z794 Long term (current) use of insulin: Secondary | ICD-10-CM | POA: Diagnosis not present

## 2019-12-08 DIAGNOSIS — M129 Arthropathy, unspecified: Secondary | ICD-10-CM | POA: Diagnosis not present

## 2019-12-08 DIAGNOSIS — Z1159 Encounter for screening for other viral diseases: Secondary | ICD-10-CM | POA: Diagnosis not present

## 2019-12-08 DIAGNOSIS — M1711 Unilateral primary osteoarthritis, right knee: Secondary | ICD-10-CM | POA: Diagnosis not present

## 2019-12-08 DIAGNOSIS — B079 Viral wart, unspecified: Secondary | ICD-10-CM | POA: Diagnosis not present

## 2019-12-08 DIAGNOSIS — M6281 Muscle weakness (generalized): Secondary | ICD-10-CM | POA: Diagnosis not present

## 2019-12-08 DIAGNOSIS — R413 Other amnesia: Secondary | ICD-10-CM | POA: Diagnosis not present

## 2019-12-08 DIAGNOSIS — Z952 Presence of prosthetic heart valve: Secondary | ICD-10-CM | POA: Diagnosis not present

## 2019-12-08 DIAGNOSIS — M9901 Segmental and somatic dysfunction of cervical region: Secondary | ICD-10-CM | POA: Diagnosis not present

## 2019-12-08 DIAGNOSIS — E291 Testicular hypofunction: Secondary | ICD-10-CM | POA: Diagnosis not present

## 2019-12-08 DIAGNOSIS — R0602 Shortness of breath: Secondary | ICD-10-CM | POA: Diagnosis not present

## 2019-12-08 DIAGNOSIS — D0462 Carcinoma in situ of skin of left upper limb, including shoulder: Secondary | ICD-10-CM | POA: Diagnosis not present

## 2019-12-08 DIAGNOSIS — E785 Hyperlipidemia, unspecified: Secondary | ICD-10-CM | POA: Diagnosis not present

## 2019-12-08 DIAGNOSIS — M80052A Age-related osteoporosis with current pathological fracture, left femur, initial encounter for fracture: Secondary | ICD-10-CM | POA: Diagnosis not present

## 2019-12-08 DIAGNOSIS — D519 Vitamin B12 deficiency anemia, unspecified: Secondary | ICD-10-CM | POA: Diagnosis not present

## 2019-12-08 DIAGNOSIS — E1122 Type 2 diabetes mellitus with diabetic chronic kidney disease: Secondary | ICD-10-CM | POA: Diagnosis not present

## 2019-12-08 DIAGNOSIS — R635 Abnormal weight gain: Secondary | ICD-10-CM | POA: Diagnosis not present

## 2019-12-08 DIAGNOSIS — Z8546 Personal history of malignant neoplasm of prostate: Secondary | ICD-10-CM | POA: Diagnosis not present

## 2019-12-08 DIAGNOSIS — Z4789 Encounter for other orthopedic aftercare: Secondary | ICD-10-CM | POA: Diagnosis not present

## 2019-12-08 DIAGNOSIS — I313 Pericardial effusion (noninflammatory): Secondary | ICD-10-CM | POA: Diagnosis not present

## 2019-12-08 DIAGNOSIS — R531 Weakness: Secondary | ICD-10-CM | POA: Diagnosis not present

## 2019-12-08 DIAGNOSIS — R768 Other specified abnormal immunological findings in serum: Secondary | ICD-10-CM | POA: Diagnosis not present

## 2019-12-08 DIAGNOSIS — M0609 Rheumatoid arthritis without rheumatoid factor, multiple sites: Secondary | ICD-10-CM | POA: Diagnosis not present

## 2019-12-08 DIAGNOSIS — I6523 Occlusion and stenosis of bilateral carotid arteries: Secondary | ICD-10-CM | POA: Diagnosis not present

## 2019-12-08 DIAGNOSIS — M0589 Other rheumatoid arthritis with rheumatoid factor of multiple sites: Secondary | ICD-10-CM | POA: Diagnosis not present

## 2019-12-08 DIAGNOSIS — R310 Gross hematuria: Secondary | ICD-10-CM | POA: Diagnosis not present

## 2019-12-08 DIAGNOSIS — E119 Type 2 diabetes mellitus without complications: Secondary | ICD-10-CM | POA: Diagnosis not present

## 2019-12-08 DIAGNOSIS — G4733 Obstructive sleep apnea (adult) (pediatric): Secondary | ICD-10-CM | POA: Diagnosis not present

## 2019-12-08 DIAGNOSIS — M9905 Segmental and somatic dysfunction of pelvic region: Secondary | ICD-10-CM | POA: Diagnosis not present

## 2019-12-08 DIAGNOSIS — N500 Atrophy of testis: Secondary | ICD-10-CM | POA: Diagnosis not present

## 2019-12-08 DIAGNOSIS — R300 Dysuria: Secondary | ICD-10-CM | POA: Diagnosis not present

## 2019-12-08 DIAGNOSIS — F0280 Dementia in other diseases classified elsewhere without behavioral disturbance: Secondary | ICD-10-CM | POA: Diagnosis not present

## 2019-12-08 DIAGNOSIS — N183 Chronic kidney disease, stage 3 unspecified: Secondary | ICD-10-CM | POA: Diagnosis not present

## 2019-12-08 DIAGNOSIS — H04123 Dry eye syndrome of bilateral lacrimal glands: Secondary | ICD-10-CM | POA: Diagnosis not present

## 2019-12-08 DIAGNOSIS — R350 Frequency of micturition: Secondary | ICD-10-CM | POA: Diagnosis not present

## 2019-12-08 DIAGNOSIS — Z1321 Encounter for screening for nutritional disorder: Secondary | ICD-10-CM | POA: Diagnosis not present

## 2019-12-08 DIAGNOSIS — K2100 Gastro-esophageal reflux disease with esophagitis, without bleeding: Secondary | ICD-10-CM | POA: Diagnosis not present

## 2019-12-08 DIAGNOSIS — G894 Chronic pain syndrome: Secondary | ICD-10-CM | POA: Diagnosis not present

## 2019-12-08 DIAGNOSIS — N182 Chronic kidney disease, stage 2 (mild): Secondary | ICD-10-CM | POA: Diagnosis not present

## 2019-12-08 DIAGNOSIS — Z0001 Encounter for general adult medical examination with abnormal findings: Secondary | ICD-10-CM | POA: Diagnosis not present

## 2019-12-08 DIAGNOSIS — M7702 Medial epicondylitis, left elbow: Secondary | ICD-10-CM | POA: Diagnosis not present

## 2019-12-08 DIAGNOSIS — I83019 Varicose veins of right lower extremity with ulcer of unspecified site: Secondary | ICD-10-CM | POA: Diagnosis not present

## 2019-12-08 DIAGNOSIS — Z131 Encounter for screening for diabetes mellitus: Secondary | ICD-10-CM | POA: Diagnosis not present

## 2019-12-08 DIAGNOSIS — R41841 Cognitive communication deficit: Secondary | ICD-10-CM | POA: Diagnosis not present

## 2019-12-08 DIAGNOSIS — I872 Venous insufficiency (chronic) (peripheral): Secondary | ICD-10-CM | POA: Diagnosis not present

## 2019-12-08 DIAGNOSIS — M1712 Unilateral primary osteoarthritis, left knee: Secondary | ICD-10-CM | POA: Diagnosis not present

## 2019-12-08 DIAGNOSIS — R8290 Unspecified abnormal findings in urine: Secondary | ICD-10-CM | POA: Diagnosis not present

## 2019-12-08 DIAGNOSIS — H35453 Secondary pigmentary degeneration, bilateral: Secondary | ICD-10-CM | POA: Diagnosis not present

## 2019-12-08 DIAGNOSIS — H353211 Exudative age-related macular degeneration, right eye, with active choroidal neovascularization: Secondary | ICD-10-CM | POA: Diagnosis not present

## 2019-12-08 DIAGNOSIS — E1121 Type 2 diabetes mellitus with diabetic nephropathy: Secondary | ICD-10-CM | POA: Diagnosis not present

## 2019-12-08 DIAGNOSIS — H35363 Drusen (degenerative) of macula, bilateral: Secondary | ICD-10-CM | POA: Diagnosis not present

## 2019-12-08 DIAGNOSIS — H353122 Nonexudative age-related macular degeneration, left eye, intermediate dry stage: Secondary | ICD-10-CM | POA: Diagnosis not present

## 2019-12-08 DIAGNOSIS — E1142 Type 2 diabetes mellitus with diabetic polyneuropathy: Secondary | ICD-10-CM | POA: Diagnosis not present

## 2019-12-08 DIAGNOSIS — I779 Disorder of arteries and arterioles, unspecified: Secondary | ICD-10-CM | POA: Diagnosis not present

## 2019-12-08 DIAGNOSIS — M955 Acquired deformity of pelvis: Secondary | ICD-10-CM | POA: Diagnosis not present

## 2019-12-08 DIAGNOSIS — G9340 Encephalopathy, unspecified: Secondary | ICD-10-CM | POA: Diagnosis not present

## 2019-12-08 DIAGNOSIS — Z7689 Persons encountering health services in other specified circumstances: Secondary | ICD-10-CM | POA: Diagnosis not present

## 2019-12-08 DIAGNOSIS — M5442 Lumbago with sciatica, left side: Secondary | ICD-10-CM | POA: Diagnosis not present

## 2019-12-08 DIAGNOSIS — D649 Anemia, unspecified: Secondary | ICD-10-CM | POA: Diagnosis not present

## 2019-12-08 DIAGNOSIS — N184 Chronic kidney disease, stage 4 (severe): Secondary | ICD-10-CM | POA: Diagnosis not present

## 2019-12-08 DIAGNOSIS — I10 Essential (primary) hypertension: Secondary | ICD-10-CM | POA: Diagnosis not present

## 2019-12-08 DIAGNOSIS — J479 Bronchiectasis, uncomplicated: Secondary | ICD-10-CM | POA: Diagnosis not present

## 2019-12-08 DIAGNOSIS — R82998 Other abnormal findings in urine: Secondary | ICD-10-CM | POA: Diagnosis not present

## 2019-12-08 DIAGNOSIS — I300 Acute nonspecific idiopathic pericarditis: Secondary | ICD-10-CM | POA: Diagnosis not present

## 2019-12-08 DIAGNOSIS — R7303 Prediabetes: Secondary | ICD-10-CM | POA: Diagnosis not present

## 2019-12-08 DIAGNOSIS — E782 Mixed hyperlipidemia: Secondary | ICD-10-CM | POA: Diagnosis not present

## 2019-12-08 DIAGNOSIS — Z1329 Encounter for screening for other suspected endocrine disorder: Secondary | ICD-10-CM | POA: Diagnosis not present

## 2019-12-08 DIAGNOSIS — L4050 Arthropathic psoriasis, unspecified: Secondary | ICD-10-CM | POA: Diagnosis not present

## 2019-12-08 DIAGNOSIS — E1169 Type 2 diabetes mellitus with other specified complication: Secondary | ICD-10-CM | POA: Diagnosis not present

## 2019-12-08 DIAGNOSIS — I700 Atherosclerosis of aorta: Secondary | ICD-10-CM | POA: Diagnosis not present

## 2019-12-08 DIAGNOSIS — I351 Nonrheumatic aortic (valve) insufficiency: Secondary | ICD-10-CM | POA: Diagnosis not present

## 2019-12-08 DIAGNOSIS — F419 Anxiety disorder, unspecified: Secondary | ICD-10-CM | POA: Diagnosis not present

## 2019-12-08 DIAGNOSIS — Z79899 Other long term (current) drug therapy: Secondary | ICD-10-CM | POA: Diagnosis not present

## 2019-12-08 DIAGNOSIS — R946 Abnormal results of thyroid function studies: Secondary | ICD-10-CM | POA: Diagnosis not present

## 2019-12-08 DIAGNOSIS — S90511A Abrasion, right ankle, initial encounter: Secondary | ICD-10-CM | POA: Diagnosis not present

## 2019-12-08 DIAGNOSIS — R7982 Elevated C-reactive protein (CRP): Secondary | ICD-10-CM | POA: Diagnosis not present

## 2019-12-08 DIAGNOSIS — R1013 Epigastric pain: Secondary | ICD-10-CM | POA: Diagnosis not present

## 2019-12-08 DIAGNOSIS — M069 Rheumatoid arthritis, unspecified: Secondary | ICD-10-CM | POA: Diagnosis not present

## 2019-12-08 DIAGNOSIS — F4310 Post-traumatic stress disorder, unspecified: Secondary | ICD-10-CM | POA: Diagnosis not present

## 2019-12-08 DIAGNOSIS — J449 Chronic obstructive pulmonary disease, unspecified: Secondary | ICD-10-CM | POA: Diagnosis not present

## 2019-12-08 DIAGNOSIS — I2510 Atherosclerotic heart disease of native coronary artery without angina pectoris: Secondary | ICD-10-CM | POA: Diagnosis not present

## 2019-12-08 DIAGNOSIS — R5383 Other fatigue: Secondary | ICD-10-CM | POA: Diagnosis not present

## 2019-12-08 DIAGNOSIS — H25813 Combined forms of age-related cataract, bilateral: Secondary | ICD-10-CM | POA: Diagnosis not present

## 2019-12-08 DIAGNOSIS — Z125 Encounter for screening for malignant neoplasm of prostate: Secondary | ICD-10-CM | POA: Diagnosis not present

## 2019-12-08 DIAGNOSIS — I350 Nonrheumatic aortic (valve) stenosis: Secondary | ICD-10-CM | POA: Diagnosis not present

## 2019-12-08 DIAGNOSIS — Z7189 Other specified counseling: Secondary | ICD-10-CM | POA: Diagnosis not present

## 2019-12-08 DIAGNOSIS — I4891 Unspecified atrial fibrillation: Secondary | ICD-10-CM | POA: Diagnosis not present

## 2019-12-08 DIAGNOSIS — R2681 Unsteadiness on feet: Secondary | ICD-10-CM | POA: Diagnosis not present

## 2019-12-08 DIAGNOSIS — R7301 Impaired fasting glucose: Secondary | ICD-10-CM | POA: Diagnosis not present

## 2019-12-08 DIAGNOSIS — I4510 Unspecified right bundle-branch block: Secondary | ICD-10-CM | POA: Diagnosis not present

## 2019-12-08 DIAGNOSIS — M9903 Segmental and somatic dysfunction of lumbar region: Secondary | ICD-10-CM | POA: Diagnosis not present

## 2019-12-08 DIAGNOSIS — K219 Gastro-esophageal reflux disease without esophagitis: Secondary | ICD-10-CM | POA: Diagnosis not present

## 2019-12-08 DIAGNOSIS — C61 Malignant neoplasm of prostate: Secondary | ICD-10-CM | POA: Diagnosis not present

## 2019-12-08 DIAGNOSIS — L989 Disorder of the skin and subcutaneous tissue, unspecified: Secondary | ICD-10-CM | POA: Diagnosis not present

## 2019-12-08 DIAGNOSIS — R42 Dizziness and giddiness: Secondary | ICD-10-CM | POA: Diagnosis not present

## 2019-12-08 DIAGNOSIS — Z13220 Encounter for screening for lipoid disorders: Secondary | ICD-10-CM | POA: Diagnosis not present

## 2019-12-08 DIAGNOSIS — A0472 Enterocolitis due to Clostridium difficile, not specified as recurrent: Secondary | ICD-10-CM | POA: Diagnosis not present

## 2019-12-08 DIAGNOSIS — N1832 Chronic kidney disease, stage 3b: Secondary | ICD-10-CM | POA: Diagnosis not present

## 2019-12-08 DIAGNOSIS — D509 Iron deficiency anemia, unspecified: Secondary | ICD-10-CM | POA: Diagnosis not present

## 2019-12-08 DIAGNOSIS — Z6826 Body mass index (BMI) 26.0-26.9, adult: Secondary | ICD-10-CM | POA: Diagnosis not present

## 2019-12-08 DIAGNOSIS — D131 Benign neoplasm of stomach: Secondary | ICD-10-CM | POA: Diagnosis not present

## 2019-12-08 DIAGNOSIS — D500 Iron deficiency anemia secondary to blood loss (chronic): Secondary | ICD-10-CM | POA: Diagnosis not present

## 2019-12-08 DIAGNOSIS — J432 Centrilobular emphysema: Secondary | ICD-10-CM | POA: Diagnosis not present

## 2019-12-08 DIAGNOSIS — R5381 Other malaise: Secondary | ICD-10-CM | POA: Diagnosis not present

## 2019-12-08 DIAGNOSIS — I5040 Unspecified combined systolic (congestive) and diastolic (congestive) heart failure: Secondary | ICD-10-CM | POA: Diagnosis not present

## 2019-12-08 DIAGNOSIS — E559 Vitamin D deficiency, unspecified: Secondary | ICD-10-CM | POA: Diagnosis not present

## 2019-12-08 DIAGNOSIS — E1165 Type 2 diabetes mellitus with hyperglycemia: Secondary | ICD-10-CM | POA: Diagnosis not present

## 2019-12-08 DIAGNOSIS — G629 Polyneuropathy, unspecified: Secondary | ICD-10-CM | POA: Diagnosis not present

## 2019-12-08 DIAGNOSIS — R202 Paresthesia of skin: Secondary | ICD-10-CM | POA: Diagnosis not present

## 2019-12-08 DIAGNOSIS — M531 Cervicobrachial syndrome: Secondary | ICD-10-CM | POA: Diagnosis not present

## 2019-12-08 DIAGNOSIS — E7849 Other hyperlipidemia: Secondary | ICD-10-CM | POA: Diagnosis not present

## 2019-12-08 DIAGNOSIS — J90 Pleural effusion, not elsewhere classified: Secondary | ICD-10-CM | POA: Diagnosis not present

## 2019-12-08 DIAGNOSIS — M109 Gout, unspecified: Secondary | ICD-10-CM | POA: Diagnosis not present

## 2019-12-08 DIAGNOSIS — E569 Vitamin deficiency, unspecified: Secondary | ICD-10-CM | POA: Diagnosis not present

## 2019-12-08 DIAGNOSIS — R072 Precordial pain: Secondary | ICD-10-CM | POA: Diagnosis not present

## 2019-12-08 DIAGNOSIS — Z1389 Encounter for screening for other disorder: Secondary | ICD-10-CM | POA: Diagnosis not present

## 2019-12-08 DIAGNOSIS — R112 Nausea with vomiting, unspecified: Secondary | ICD-10-CM | POA: Diagnosis not present

## 2019-12-08 DIAGNOSIS — E538 Deficiency of other specified B group vitamins: Secondary | ICD-10-CM | POA: Diagnosis not present

## 2019-12-08 DIAGNOSIS — U071 COVID-19: Secondary | ICD-10-CM | POA: Diagnosis not present

## 2019-12-08 DIAGNOSIS — D171 Benign lipomatous neoplasm of skin and subcutaneous tissue of trunk: Secondary | ICD-10-CM | POA: Diagnosis not present

## 2019-12-08 DIAGNOSIS — K625 Hemorrhage of anus and rectum: Secondary | ICD-10-CM | POA: Diagnosis not present

## 2019-12-08 DIAGNOSIS — S72002D Fracture of unspecified part of neck of left femur, subsequent encounter for closed fracture with routine healing: Secondary | ICD-10-CM | POA: Diagnosis not present

## 2019-12-08 DIAGNOSIS — Z6823 Body mass index (BMI) 23.0-23.9, adult: Secondary | ICD-10-CM | POA: Diagnosis not present

## 2019-12-08 DIAGNOSIS — K5100 Ulcerative (chronic) pancolitis without complications: Secondary | ICD-10-CM | POA: Diagnosis not present

## 2019-12-08 DIAGNOSIS — E7800 Pure hypercholesterolemia, unspecified: Secondary | ICD-10-CM | POA: Diagnosis not present

## 2019-12-08 DIAGNOSIS — Z0000 Encounter for general adult medical examination without abnormal findings: Secondary | ICD-10-CM | POA: Diagnosis not present

## 2019-12-08 DIAGNOSIS — Z1331 Encounter for screening for depression: Secondary | ICD-10-CM | POA: Diagnosis not present

## 2019-12-08 DIAGNOSIS — D123 Benign neoplasm of transverse colon: Secondary | ICD-10-CM | POA: Diagnosis not present

## 2019-12-08 DIAGNOSIS — R05 Cough: Secondary | ICD-10-CM | POA: Diagnosis not present

## 2019-12-08 DIAGNOSIS — I25119 Atherosclerotic heart disease of native coronary artery with unspecified angina pectoris: Secondary | ICD-10-CM | POA: Diagnosis not present

## 2019-12-08 DIAGNOSIS — E039 Hypothyroidism, unspecified: Secondary | ICD-10-CM | POA: Diagnosis not present

## 2019-12-08 DIAGNOSIS — I119 Hypertensive heart disease without heart failure: Secondary | ICD-10-CM | POA: Diagnosis not present

## 2019-12-08 DIAGNOSIS — N289 Disorder of kidney and ureter, unspecified: Secondary | ICD-10-CM | POA: Diagnosis not present

## 2019-12-08 DIAGNOSIS — E063 Autoimmune thyroiditis: Secondary | ICD-10-CM | POA: Diagnosis not present

## 2019-12-08 DIAGNOSIS — E663 Overweight: Secondary | ICD-10-CM | POA: Diagnosis not present

## 2019-12-08 DIAGNOSIS — C50311 Malignant neoplasm of lower-inner quadrant of right female breast: Secondary | ICD-10-CM | POA: Diagnosis not present

## 2019-12-08 DIAGNOSIS — D0439 Carcinoma in situ of skin of other parts of face: Secondary | ICD-10-CM | POA: Diagnosis not present

## 2019-12-08 DIAGNOSIS — Z836 Family history of other diseases of the respiratory system: Secondary | ICD-10-CM | POA: Diagnosis not present

## 2019-12-08 DIAGNOSIS — I480 Paroxysmal atrial fibrillation: Secondary | ICD-10-CM | POA: Diagnosis not present

## 2019-12-08 DIAGNOSIS — M810 Age-related osteoporosis without current pathological fracture: Secondary | ICD-10-CM | POA: Diagnosis not present

## 2019-12-09 DIAGNOSIS — H532 Diplopia: Secondary | ICD-10-CM | POA: Diagnosis not present

## 2019-12-09 DIAGNOSIS — R748 Abnormal levels of other serum enzymes: Secondary | ICD-10-CM | POA: Diagnosis not present

## 2019-12-09 DIAGNOSIS — M25551 Pain in right hip: Secondary | ICD-10-CM | POA: Diagnosis not present

## 2019-12-09 DIAGNOSIS — M109 Gout, unspecified: Secondary | ICD-10-CM | POA: Diagnosis not present

## 2019-12-09 DIAGNOSIS — Z85038 Personal history of other malignant neoplasm of large intestine: Secondary | ICD-10-CM | POA: Diagnosis not present

## 2019-12-09 DIAGNOSIS — Z111 Encounter for screening for respiratory tuberculosis: Secondary | ICD-10-CM | POA: Diagnosis not present

## 2019-12-09 DIAGNOSIS — L8990 Pressure ulcer of unspecified site, unspecified stage: Secondary | ICD-10-CM | POA: Diagnosis not present

## 2019-12-09 DIAGNOSIS — R8290 Unspecified abnormal findings in urine: Secondary | ICD-10-CM | POA: Diagnosis not present

## 2019-12-09 DIAGNOSIS — F411 Generalized anxiety disorder: Secondary | ICD-10-CM | POA: Diagnosis not present

## 2019-12-09 DIAGNOSIS — N183 Chronic kidney disease, stage 3 unspecified: Secondary | ICD-10-CM | POA: Diagnosis not present

## 2019-12-09 DIAGNOSIS — Z0000 Encounter for general adult medical examination without abnormal findings: Secondary | ICD-10-CM | POA: Diagnosis not present

## 2019-12-09 DIAGNOSIS — E875 Hyperkalemia: Secondary | ICD-10-CM | POA: Diagnosis not present

## 2019-12-09 DIAGNOSIS — M6281 Muscle weakness (generalized): Secondary | ICD-10-CM | POA: Diagnosis not present

## 2019-12-09 DIAGNOSIS — R3915 Urgency of urination: Secondary | ICD-10-CM | POA: Diagnosis not present

## 2019-12-09 DIAGNOSIS — N189 Chronic kidney disease, unspecified: Secondary | ICD-10-CM | POA: Diagnosis not present

## 2019-12-09 DIAGNOSIS — T148XXA Other injury of unspecified body region, initial encounter: Secondary | ICD-10-CM | POA: Diagnosis not present

## 2019-12-09 DIAGNOSIS — Z01419 Encounter for gynecological examination (general) (routine) without abnormal findings: Secondary | ICD-10-CM | POA: Diagnosis not present

## 2019-12-09 DIAGNOSIS — R0789 Other chest pain: Secondary | ICD-10-CM | POA: Diagnosis not present

## 2019-12-09 DIAGNOSIS — M1990 Unspecified osteoarthritis, unspecified site: Secondary | ICD-10-CM | POA: Diagnosis not present

## 2019-12-09 DIAGNOSIS — N184 Chronic kidney disease, stage 4 (severe): Secondary | ICD-10-CM | POA: Diagnosis not present

## 2019-12-09 DIAGNOSIS — M545 Low back pain: Secondary | ICD-10-CM | POA: Diagnosis not present

## 2019-12-09 DIAGNOSIS — I5033 Acute on chronic diastolic (congestive) heart failure: Secondary | ICD-10-CM | POA: Diagnosis not present

## 2019-12-09 DIAGNOSIS — Z794 Long term (current) use of insulin: Secondary | ICD-10-CM | POA: Diagnosis not present

## 2019-12-09 DIAGNOSIS — E039 Hypothyroidism, unspecified: Secondary | ICD-10-CM | POA: Diagnosis not present

## 2019-12-09 DIAGNOSIS — E7800 Pure hypercholesterolemia, unspecified: Secondary | ICD-10-CM | POA: Diagnosis not present

## 2019-12-09 DIAGNOSIS — I129 Hypertensive chronic kidney disease with stage 1 through stage 4 chronic kidney disease, or unspecified chronic kidney disease: Secondary | ICD-10-CM | POA: Diagnosis not present

## 2019-12-09 DIAGNOSIS — G4761 Periodic limb movement disorder: Secondary | ICD-10-CM | POA: Diagnosis not present

## 2019-12-09 DIAGNOSIS — E559 Vitamin D deficiency, unspecified: Secondary | ICD-10-CM | POA: Diagnosis not present

## 2019-12-09 DIAGNOSIS — Z940 Kidney transplant status: Secondary | ICD-10-CM | POA: Diagnosis not present

## 2019-12-09 DIAGNOSIS — E1140 Type 2 diabetes mellitus with diabetic neuropathy, unspecified: Secondary | ICD-10-CM | POA: Diagnosis not present

## 2019-12-09 DIAGNOSIS — I420 Dilated cardiomyopathy: Secondary | ICD-10-CM | POA: Diagnosis not present

## 2019-12-09 DIAGNOSIS — N3000 Acute cystitis without hematuria: Secondary | ICD-10-CM | POA: Diagnosis not present

## 2019-12-09 DIAGNOSIS — R2681 Unsteadiness on feet: Secondary | ICD-10-CM | POA: Diagnosis not present

## 2019-12-09 DIAGNOSIS — L89312 Pressure ulcer of right buttock, stage 2: Secondary | ICD-10-CM | POA: Diagnosis not present

## 2019-12-09 DIAGNOSIS — R609 Edema, unspecified: Secondary | ICD-10-CM | POA: Diagnosis not present

## 2019-12-09 DIAGNOSIS — R1013 Epigastric pain: Secondary | ICD-10-CM | POA: Diagnosis not present

## 2019-12-09 DIAGNOSIS — E876 Hypokalemia: Secondary | ICD-10-CM | POA: Diagnosis not present

## 2019-12-09 DIAGNOSIS — R0902 Hypoxemia: Secondary | ICD-10-CM | POA: Diagnosis not present

## 2019-12-09 DIAGNOSIS — I10 Essential (primary) hypertension: Secondary | ICD-10-CM | POA: Diagnosis not present

## 2019-12-09 DIAGNOSIS — R32 Unspecified urinary incontinence: Secondary | ICD-10-CM | POA: Diagnosis not present

## 2019-12-09 DIAGNOSIS — R5381 Other malaise: Secondary | ICD-10-CM | POA: Diagnosis not present

## 2019-12-09 DIAGNOSIS — M19042 Primary osteoarthritis, left hand: Secondary | ICD-10-CM | POA: Diagnosis not present

## 2019-12-09 DIAGNOSIS — N186 End stage renal disease: Secondary | ICD-10-CM | POA: Diagnosis not present

## 2019-12-09 DIAGNOSIS — R079 Chest pain, unspecified: Secondary | ICD-10-CM | POA: Diagnosis not present

## 2019-12-09 DIAGNOSIS — Z125 Encounter for screening for malignant neoplasm of prostate: Secondary | ICD-10-CM | POA: Diagnosis not present

## 2019-12-09 DIAGNOSIS — E1129 Type 2 diabetes mellitus with other diabetic kidney complication: Secondary | ICD-10-CM | POA: Diagnosis not present

## 2019-12-09 DIAGNOSIS — G4733 Obstructive sleep apnea (adult) (pediatric): Secondary | ICD-10-CM | POA: Diagnosis not present

## 2019-12-09 DIAGNOSIS — E119 Type 2 diabetes mellitus without complications: Secondary | ICD-10-CM | POA: Diagnosis not present

## 2019-12-09 DIAGNOSIS — E211 Secondary hyperparathyroidism, not elsewhere classified: Secondary | ICD-10-CM | POA: Diagnosis not present

## 2019-12-09 DIAGNOSIS — M159 Polyosteoarthritis, unspecified: Secondary | ICD-10-CM | POA: Diagnosis not present

## 2019-12-09 DIAGNOSIS — D123 Benign neoplasm of transverse colon: Secondary | ICD-10-CM | POA: Diagnosis not present

## 2019-12-09 DIAGNOSIS — E038 Other specified hypothyroidism: Secondary | ICD-10-CM | POA: Diagnosis not present

## 2019-12-09 DIAGNOSIS — M19041 Primary osteoarthritis, right hand: Secondary | ICD-10-CM | POA: Diagnosis not present

## 2019-12-09 DIAGNOSIS — R180 Malignant ascites: Secondary | ICD-10-CM | POA: Diagnosis not present

## 2019-12-09 DIAGNOSIS — R350 Frequency of micturition: Secondary | ICD-10-CM | POA: Diagnosis not present

## 2019-12-09 DIAGNOSIS — I25118 Atherosclerotic heart disease of native coronary artery with other forms of angina pectoris: Secondary | ICD-10-CM | POA: Diagnosis not present

## 2019-12-09 DIAGNOSIS — D638 Anemia in other chronic diseases classified elsewhere: Secondary | ICD-10-CM | POA: Diagnosis not present

## 2019-12-09 DIAGNOSIS — N185 Chronic kidney disease, stage 5: Secondary | ICD-10-CM | POA: Diagnosis not present

## 2019-12-09 DIAGNOSIS — R112 Nausea with vomiting, unspecified: Secondary | ICD-10-CM | POA: Diagnosis not present

## 2019-12-09 DIAGNOSIS — E538 Deficiency of other specified B group vitamins: Secondary | ICD-10-CM | POA: Diagnosis not present

## 2019-12-09 DIAGNOSIS — N390 Urinary tract infection, site not specified: Secondary | ICD-10-CM | POA: Diagnosis not present

## 2019-12-09 DIAGNOSIS — R809 Proteinuria, unspecified: Secondary | ICD-10-CM | POA: Diagnosis not present

## 2019-12-09 DIAGNOSIS — E568 Deficiency of other vitamins: Secondary | ICD-10-CM | POA: Diagnosis not present

## 2019-12-09 DIAGNOSIS — E1149 Type 2 diabetes mellitus with other diabetic neurological complication: Secondary | ICD-10-CM | POA: Diagnosis not present

## 2019-12-09 DIAGNOSIS — I2510 Atherosclerotic heart disease of native coronary artery without angina pectoris: Secondary | ICD-10-CM | POA: Diagnosis not present

## 2019-12-09 DIAGNOSIS — C250 Malignant neoplasm of head of pancreas: Secondary | ICD-10-CM | POA: Diagnosis not present

## 2019-12-09 DIAGNOSIS — E7801 Familial hypercholesterolemia: Secondary | ICD-10-CM | POA: Diagnosis not present

## 2019-12-09 DIAGNOSIS — R7989 Other specified abnormal findings of blood chemistry: Secondary | ICD-10-CM | POA: Diagnosis not present

## 2019-12-09 DIAGNOSIS — D72829 Elevated white blood cell count, unspecified: Secondary | ICD-10-CM | POA: Diagnosis not present

## 2019-12-09 DIAGNOSIS — C61 Malignant neoplasm of prostate: Secondary | ICD-10-CM | POA: Diagnosis not present

## 2019-12-09 DIAGNOSIS — C9110 Chronic lymphocytic leukemia of B-cell type not having achieved remission: Secondary | ICD-10-CM | POA: Diagnosis not present

## 2019-12-09 DIAGNOSIS — Z79899 Other long term (current) drug therapy: Secondary | ICD-10-CM | POA: Diagnosis not present

## 2019-12-09 DIAGNOSIS — R5382 Chronic fatigue, unspecified: Secondary | ICD-10-CM | POA: Diagnosis not present

## 2019-12-09 DIAGNOSIS — R399 Unspecified symptoms and signs involving the genitourinary system: Secondary | ICD-10-CM | POA: Diagnosis not present

## 2019-12-09 DIAGNOSIS — N3020 Other chronic cystitis without hematuria: Secondary | ICD-10-CM | POA: Diagnosis not present

## 2019-12-09 DIAGNOSIS — I5032 Chronic diastolic (congestive) heart failure: Secondary | ICD-10-CM | POA: Diagnosis not present

## 2019-12-09 DIAGNOSIS — R0600 Dyspnea, unspecified: Secondary | ICD-10-CM | POA: Diagnosis not present

## 2019-12-09 DIAGNOSIS — E871 Hypo-osmolality and hyponatremia: Secondary | ICD-10-CM | POA: Diagnosis not present

## 2019-12-09 DIAGNOSIS — R7303 Prediabetes: Secondary | ICD-10-CM | POA: Diagnosis not present

## 2019-12-09 DIAGNOSIS — M79605 Pain in left leg: Secondary | ICD-10-CM | POA: Diagnosis not present

## 2019-12-09 DIAGNOSIS — R5383 Other fatigue: Secondary | ICD-10-CM | POA: Diagnosis not present

## 2019-12-09 DIAGNOSIS — Z5181 Encounter for therapeutic drug level monitoring: Secondary | ICD-10-CM | POA: Diagnosis not present

## 2019-12-09 DIAGNOSIS — K2100 Gastro-esophageal reflux disease with esophagitis, without bleeding: Secondary | ICD-10-CM | POA: Diagnosis not present

## 2019-12-09 DIAGNOSIS — N2581 Secondary hyperparathyroidism of renal origin: Secondary | ICD-10-CM | POA: Diagnosis not present

## 2019-12-09 DIAGNOSIS — M25511 Pain in right shoulder: Secondary | ICD-10-CM | POA: Diagnosis not present

## 2019-12-09 DIAGNOSIS — G894 Chronic pain syndrome: Secondary | ICD-10-CM | POA: Diagnosis not present

## 2019-12-09 DIAGNOSIS — D539 Nutritional anemia, unspecified: Secondary | ICD-10-CM | POA: Diagnosis not present

## 2019-12-09 DIAGNOSIS — F1310 Sedative, hypnotic or anxiolytic abuse, uncomplicated: Secondary | ICD-10-CM | POA: Diagnosis not present

## 2019-12-09 DIAGNOSIS — J90 Pleural effusion, not elsewhere classified: Secondary | ICD-10-CM | POA: Diagnosis not present

## 2019-12-09 DIAGNOSIS — M179 Osteoarthritis of knee, unspecified: Secondary | ICD-10-CM | POA: Diagnosis not present

## 2019-12-09 DIAGNOSIS — R070 Pain in throat: Secondary | ICD-10-CM | POA: Diagnosis not present

## 2019-12-09 DIAGNOSIS — Z79891 Long term (current) use of opiate analgesic: Secondary | ICD-10-CM | POA: Diagnosis not present

## 2019-12-09 DIAGNOSIS — E785 Hyperlipidemia, unspecified: Secondary | ICD-10-CM | POA: Diagnosis not present

## 2019-12-09 DIAGNOSIS — E1169 Type 2 diabetes mellitus with other specified complication: Secondary | ICD-10-CM | POA: Diagnosis not present

## 2019-12-09 DIAGNOSIS — H539 Unspecified visual disturbance: Secondary | ICD-10-CM | POA: Diagnosis not present

## 2019-12-09 DIAGNOSIS — R05 Cough: Secondary | ICD-10-CM | POA: Diagnosis not present

## 2019-12-09 DIAGNOSIS — M810 Age-related osteoporosis without current pathological fracture: Secondary | ICD-10-CM | POA: Diagnosis not present

## 2019-12-09 DIAGNOSIS — R1312 Dysphagia, oropharyngeal phase: Secondary | ICD-10-CM | POA: Diagnosis not present

## 2019-12-09 DIAGNOSIS — F0390 Unspecified dementia without behavioral disturbance: Secondary | ICD-10-CM | POA: Diagnosis not present

## 2019-12-09 DIAGNOSIS — Z20822 Contact with and (suspected) exposure to covid-19: Secondary | ICD-10-CM | POA: Diagnosis not present

## 2019-12-09 DIAGNOSIS — R29898 Other symptoms and signs involving the musculoskeletal system: Secondary | ICD-10-CM | POA: Diagnosis not present

## 2019-12-09 DIAGNOSIS — E639 Nutritional deficiency, unspecified: Secondary | ICD-10-CM | POA: Diagnosis not present

## 2019-12-09 DIAGNOSIS — M2550 Pain in unspecified joint: Secondary | ICD-10-CM | POA: Diagnosis not present

## 2019-12-09 DIAGNOSIS — M0589 Other rheumatoid arthritis with rheumatoid factor of multiple sites: Secondary | ICD-10-CM | POA: Diagnosis not present

## 2019-12-09 DIAGNOSIS — R1011 Right upper quadrant pain: Secondary | ICD-10-CM | POA: Diagnosis not present

## 2019-12-09 DIAGNOSIS — M0579 Rheumatoid arthritis with rheumatoid factor of multiple sites without organ or systems involvement: Secondary | ICD-10-CM | POA: Diagnosis not present

## 2019-12-09 DIAGNOSIS — E782 Mixed hyperlipidemia: Secondary | ICD-10-CM | POA: Diagnosis not present

## 2019-12-09 DIAGNOSIS — E1122 Type 2 diabetes mellitus with diabetic chronic kidney disease: Secondary | ICD-10-CM | POA: Diagnosis not present

## 2019-12-09 DIAGNOSIS — Z20828 Contact with and (suspected) exposure to other viral communicable diseases: Secondary | ICD-10-CM | POA: Diagnosis not present

## 2019-12-09 DIAGNOSIS — R3989 Other symptoms and signs involving the genitourinary system: Secondary | ICD-10-CM | POA: Diagnosis not present

## 2019-12-09 DIAGNOSIS — Z1211 Encounter for screening for malignant neoplasm of colon: Secondary | ICD-10-CM | POA: Diagnosis not present

## 2019-12-09 DIAGNOSIS — R7301 Impaired fasting glucose: Secondary | ICD-10-CM | POA: Diagnosis not present

## 2019-12-09 DIAGNOSIS — R972 Elevated prostate specific antigen [PSA]: Secondary | ICD-10-CM | POA: Diagnosis not present

## 2019-12-09 DIAGNOSIS — E1165 Type 2 diabetes mellitus with hyperglycemia: Secondary | ICD-10-CM | POA: Diagnosis not present

## 2019-12-09 DIAGNOSIS — J449 Chronic obstructive pulmonary disease, unspecified: Secondary | ICD-10-CM | POA: Diagnosis not present

## 2019-12-09 DIAGNOSIS — K219 Gastro-esophageal reflux disease without esophagitis: Secondary | ICD-10-CM | POA: Diagnosis not present

## 2019-12-09 DIAGNOSIS — E7849 Other hyperlipidemia: Secondary | ICD-10-CM | POA: Diagnosis not present

## 2019-12-09 DIAGNOSIS — I4891 Unspecified atrial fibrillation: Secondary | ICD-10-CM | POA: Diagnosis not present

## 2019-12-09 DIAGNOSIS — S72001D Fracture of unspecified part of neck of right femur, subsequent encounter for closed fracture with routine healing: Secondary | ICD-10-CM | POA: Diagnosis not present

## 2019-12-09 DIAGNOSIS — R293 Abnormal posture: Secondary | ICD-10-CM | POA: Diagnosis not present

## 2019-12-10 DIAGNOSIS — Z87440 Personal history of urinary (tract) infections: Secondary | ICD-10-CM | POA: Diagnosis not present

## 2019-12-10 DIAGNOSIS — I10 Essential (primary) hypertension: Secondary | ICD-10-CM | POA: Diagnosis not present

## 2019-12-10 DIAGNOSIS — Z20822 Contact with and (suspected) exposure to covid-19: Secondary | ICD-10-CM | POA: Diagnosis not present

## 2019-12-10 DIAGNOSIS — R55 Syncope and collapse: Secondary | ICD-10-CM | POA: Diagnosis not present

## 2019-12-10 DIAGNOSIS — J439 Emphysema, unspecified: Secondary | ICD-10-CM | POA: Diagnosis not present

## 2019-12-10 DIAGNOSIS — R8290 Unspecified abnormal findings in urine: Secondary | ICD-10-CM | POA: Diagnosis not present

## 2019-12-10 DIAGNOSIS — R82998 Other abnormal findings in urine: Secondary | ICD-10-CM | POA: Diagnosis not present

## 2019-12-10 DIAGNOSIS — E669 Obesity, unspecified: Secondary | ICD-10-CM | POA: Diagnosis not present

## 2019-12-10 DIAGNOSIS — R310 Gross hematuria: Secondary | ICD-10-CM | POA: Diagnosis not present

## 2019-12-10 DIAGNOSIS — Z96652 Presence of left artificial knee joint: Secondary | ICD-10-CM | POA: Diagnosis not present

## 2019-12-10 DIAGNOSIS — E871 Hypo-osmolality and hyponatremia: Secondary | ICD-10-CM | POA: Diagnosis not present

## 2019-12-10 DIAGNOSIS — Z6832 Body mass index (BMI) 32.0-32.9, adult: Secondary | ICD-10-CM | POA: Diagnosis not present

## 2019-12-10 DIAGNOSIS — B9562 Methicillin resistant Staphylococcus aureus infection as the cause of diseases classified elsewhere: Secondary | ICD-10-CM | POA: Diagnosis not present

## 2019-12-10 DIAGNOSIS — M47816 Spondylosis without myelopathy or radiculopathy, lumbar region: Secondary | ICD-10-CM | POA: Diagnosis not present

## 2019-12-10 DIAGNOSIS — Z0000 Encounter for general adult medical examination without abnormal findings: Secondary | ICD-10-CM | POA: Diagnosis not present

## 2019-12-10 DIAGNOSIS — R519 Headache, unspecified: Secondary | ICD-10-CM | POA: Diagnosis not present

## 2019-12-10 DIAGNOSIS — Z471 Aftercare following joint replacement surgery: Secondary | ICD-10-CM | POA: Diagnosis not present

## 2019-12-10 DIAGNOSIS — M9903 Segmental and somatic dysfunction of lumbar region: Secondary | ICD-10-CM | POA: Diagnosis not present

## 2019-12-10 DIAGNOSIS — E039 Hypothyroidism, unspecified: Secondary | ICD-10-CM | POA: Diagnosis not present

## 2019-12-10 DIAGNOSIS — M9905 Segmental and somatic dysfunction of pelvic region: Secondary | ICD-10-CM | POA: Diagnosis not present

## 2019-12-10 DIAGNOSIS — E781 Pure hyperglyceridemia: Secondary | ICD-10-CM | POA: Diagnosis not present

## 2019-12-10 DIAGNOSIS — E663 Overweight: Secondary | ICD-10-CM | POA: Diagnosis not present

## 2019-12-10 DIAGNOSIS — E210 Primary hyperparathyroidism: Secondary | ICD-10-CM | POA: Diagnosis not present

## 2019-12-10 DIAGNOSIS — B961 Klebsiella pneumoniae [K. pneumoniae] as the cause of diseases classified elsewhere: Secondary | ICD-10-CM | POA: Diagnosis not present

## 2019-12-10 DIAGNOSIS — T8383XA Hemorrhage of genitourinary prosthetic devices, implants and grafts, initial encounter: Secondary | ICD-10-CM | POA: Diagnosis not present

## 2019-12-10 DIAGNOSIS — E349 Endocrine disorder, unspecified: Secondary | ICD-10-CM | POA: Diagnosis not present

## 2019-12-10 DIAGNOSIS — R7303 Prediabetes: Secondary | ICD-10-CM | POA: Diagnosis not present

## 2019-12-10 DIAGNOSIS — C07 Malignant neoplasm of parotid gland: Secondary | ICD-10-CM | POA: Diagnosis not present

## 2019-12-10 DIAGNOSIS — S46312A Strain of muscle, fascia and tendon of triceps, left arm, initial encounter: Secondary | ICD-10-CM | POA: Diagnosis not present

## 2019-12-10 DIAGNOSIS — E1143 Type 2 diabetes mellitus with diabetic autonomic (poly)neuropathy: Secondary | ICD-10-CM | POA: Diagnosis not present

## 2019-12-10 DIAGNOSIS — N183 Chronic kidney disease, stage 3 unspecified: Secondary | ICD-10-CM | POA: Diagnosis not present

## 2019-12-10 DIAGNOSIS — E6609 Other obesity due to excess calories: Secondary | ICD-10-CM | POA: Diagnosis not present

## 2019-12-10 DIAGNOSIS — M6281 Muscle weakness (generalized): Secondary | ICD-10-CM | POA: Diagnosis not present

## 2019-12-10 DIAGNOSIS — C9210 Chronic myeloid leukemia, BCR/ABL-positive, not having achieved remission: Secondary | ICD-10-CM | POA: Diagnosis not present

## 2019-12-10 DIAGNOSIS — L0390 Cellulitis, unspecified: Secondary | ICD-10-CM | POA: Diagnosis not present

## 2019-12-10 DIAGNOSIS — E785 Hyperlipidemia, unspecified: Secondary | ICD-10-CM | POA: Diagnosis not present

## 2019-12-10 DIAGNOSIS — M5442 Lumbago with sciatica, left side: Secondary | ICD-10-CM | POA: Diagnosis not present

## 2019-12-10 DIAGNOSIS — D751 Secondary polycythemia: Secondary | ICD-10-CM | POA: Diagnosis not present

## 2019-12-10 DIAGNOSIS — Z6835 Body mass index (BMI) 35.0-35.9, adult: Secondary | ICD-10-CM | POA: Diagnosis not present

## 2019-12-10 DIAGNOSIS — Z6826 Body mass index (BMI) 26.0-26.9, adult: Secondary | ICD-10-CM | POA: Diagnosis not present

## 2019-12-10 DIAGNOSIS — M955 Acquired deformity of pelvis: Secondary | ICD-10-CM | POA: Diagnosis not present

## 2019-12-10 DIAGNOSIS — M129 Arthropathy, unspecified: Secondary | ICD-10-CM | POA: Diagnosis not present

## 2019-12-10 DIAGNOSIS — N390 Urinary tract infection, site not specified: Secondary | ICD-10-CM | POA: Diagnosis not present

## 2019-12-10 DIAGNOSIS — M109 Gout, unspecified: Secondary | ICD-10-CM | POA: Diagnosis not present

## 2019-12-10 DIAGNOSIS — E7849 Other hyperlipidemia: Secondary | ICD-10-CM | POA: Diagnosis not present

## 2019-12-10 DIAGNOSIS — I4892 Unspecified atrial flutter: Secondary | ICD-10-CM | POA: Diagnosis not present

## 2019-12-10 DIAGNOSIS — D62 Acute posthemorrhagic anemia: Secondary | ICD-10-CM | POA: Diagnosis not present

## 2019-12-10 DIAGNOSIS — M791 Myalgia, unspecified site: Secondary | ICD-10-CM | POA: Diagnosis not present

## 2019-12-10 DIAGNOSIS — M79669 Pain in unspecified lower leg: Secondary | ICD-10-CM | POA: Diagnosis not present

## 2019-12-10 DIAGNOSIS — I4821 Permanent atrial fibrillation: Secondary | ICD-10-CM | POA: Diagnosis not present

## 2019-12-10 DIAGNOSIS — E1129 Type 2 diabetes mellitus with other diabetic kidney complication: Secondary | ICD-10-CM | POA: Diagnosis not present

## 2019-12-10 DIAGNOSIS — Z1159 Encounter for screening for other viral diseases: Secondary | ICD-10-CM | POA: Diagnosis not present

## 2019-12-10 DIAGNOSIS — Z08 Encounter for follow-up examination after completed treatment for malignant neoplasm: Secondary | ICD-10-CM | POA: Diagnosis not present

## 2019-12-10 DIAGNOSIS — M1990 Unspecified osteoarthritis, unspecified site: Secondary | ICD-10-CM | POA: Diagnosis not present

## 2019-12-10 DIAGNOSIS — R079 Chest pain, unspecified: Secondary | ICD-10-CM | POA: Diagnosis not present

## 2019-12-10 DIAGNOSIS — Z22322 Carrier or suspected carrier of Methicillin resistant Staphylococcus aureus: Secondary | ICD-10-CM | POA: Diagnosis not present

## 2019-12-10 DIAGNOSIS — I6529 Occlusion and stenosis of unspecified carotid artery: Secondary | ICD-10-CM | POA: Diagnosis not present

## 2019-12-10 DIAGNOSIS — D696 Thrombocytopenia, unspecified: Secondary | ICD-10-CM | POA: Diagnosis not present

## 2019-12-10 DIAGNOSIS — Z1211 Encounter for screening for malignant neoplasm of colon: Secondary | ICD-10-CM | POA: Diagnosis not present

## 2019-12-10 DIAGNOSIS — R300 Dysuria: Secondary | ICD-10-CM | POA: Diagnosis not present

## 2019-12-10 DIAGNOSIS — J9601 Acute respiratory failure with hypoxia: Secondary | ICD-10-CM | POA: Diagnosis not present

## 2019-12-10 DIAGNOSIS — R9439 Abnormal result of other cardiovascular function study: Secondary | ICD-10-CM | POA: Diagnosis not present

## 2019-12-10 DIAGNOSIS — F411 Generalized anxiety disorder: Secondary | ICD-10-CM | POA: Diagnosis not present

## 2019-12-10 DIAGNOSIS — M8589 Other specified disorders of bone density and structure, multiple sites: Secondary | ICD-10-CM | POA: Diagnosis not present

## 2019-12-10 DIAGNOSIS — Z6830 Body mass index (BMI) 30.0-30.9, adult: Secondary | ICD-10-CM | POA: Diagnosis not present

## 2019-12-10 DIAGNOSIS — D649 Anemia, unspecified: Secondary | ICD-10-CM | POA: Diagnosis not present

## 2019-12-10 DIAGNOSIS — H43813 Vitreous degeneration, bilateral: Secondary | ICD-10-CM | POA: Diagnosis not present

## 2019-12-10 DIAGNOSIS — N3289 Other specified disorders of bladder: Secondary | ICD-10-CM | POA: Diagnosis not present

## 2019-12-10 DIAGNOSIS — N320 Bladder-neck obstruction: Secondary | ICD-10-CM | POA: Diagnosis not present

## 2019-12-10 DIAGNOSIS — Z7189 Other specified counseling: Secondary | ICD-10-CM | POA: Diagnosis not present

## 2019-12-10 DIAGNOSIS — E875 Hyperkalemia: Secondary | ICD-10-CM | POA: Diagnosis not present

## 2019-12-10 DIAGNOSIS — D518 Other vitamin B12 deficiency anemias: Secondary | ICD-10-CM | POA: Diagnosis not present

## 2019-12-10 DIAGNOSIS — Z8616 Personal history of COVID-19: Secondary | ICD-10-CM | POA: Diagnosis not present

## 2019-12-10 DIAGNOSIS — R928 Other abnormal and inconclusive findings on diagnostic imaging of breast: Secondary | ICD-10-CM | POA: Diagnosis not present

## 2019-12-10 DIAGNOSIS — R4701 Aphasia: Secondary | ICD-10-CM | POA: Diagnosis not present

## 2019-12-10 DIAGNOSIS — R972 Elevated prostate specific antigen [PSA]: Secondary | ICD-10-CM | POA: Diagnosis not present

## 2019-12-10 DIAGNOSIS — Z87898 Personal history of other specified conditions: Secondary | ICD-10-CM | POA: Diagnosis not present

## 2019-12-10 DIAGNOSIS — Z79899 Other long term (current) drug therapy: Secondary | ICD-10-CM | POA: Diagnosis not present

## 2019-12-10 DIAGNOSIS — Z6841 Body Mass Index (BMI) 40.0 and over, adult: Secondary | ICD-10-CM | POA: Diagnosis not present

## 2019-12-10 DIAGNOSIS — H26491 Other secondary cataract, right eye: Secondary | ICD-10-CM | POA: Diagnosis not present

## 2019-12-10 DIAGNOSIS — L97512 Non-pressure chronic ulcer of other part of right foot with fat layer exposed: Secondary | ICD-10-CM | POA: Diagnosis not present

## 2019-12-10 DIAGNOSIS — I480 Paroxysmal atrial fibrillation: Secondary | ICD-10-CM | POA: Diagnosis not present

## 2019-12-10 DIAGNOSIS — D519 Vitamin B12 deficiency anemia, unspecified: Secondary | ICD-10-CM | POA: Diagnosis not present

## 2019-12-10 DIAGNOSIS — K648 Other hemorrhoids: Secondary | ICD-10-CM | POA: Diagnosis not present

## 2019-12-10 DIAGNOSIS — Z794 Long term (current) use of insulin: Secondary | ICD-10-CM | POA: Diagnosis not present

## 2019-12-10 DIAGNOSIS — E0590 Thyrotoxicosis, unspecified without thyrotoxic crisis or storm: Secondary | ICD-10-CM | POA: Diagnosis not present

## 2019-12-10 DIAGNOSIS — J019 Acute sinusitis, unspecified: Secondary | ICD-10-CM | POA: Diagnosis not present

## 2019-12-10 DIAGNOSIS — D0511 Intraductal carcinoma in situ of right breast: Secondary | ICD-10-CM | POA: Diagnosis not present

## 2019-12-10 DIAGNOSIS — R319 Hematuria, unspecified: Secondary | ICD-10-CM | POA: Diagnosis not present

## 2019-12-10 DIAGNOSIS — K430 Incisional hernia with obstruction, without gangrene: Secondary | ICD-10-CM | POA: Diagnosis not present

## 2019-12-10 DIAGNOSIS — M19049 Primary osteoarthritis, unspecified hand: Secondary | ICD-10-CM | POA: Diagnosis not present

## 2019-12-10 DIAGNOSIS — N400 Enlarged prostate without lower urinary tract symptoms: Secondary | ICD-10-CM | POA: Diagnosis not present

## 2019-12-10 DIAGNOSIS — I2510 Atherosclerotic heart disease of native coronary artery without angina pectoris: Secondary | ICD-10-CM | POA: Diagnosis not present

## 2019-12-10 DIAGNOSIS — I350 Nonrheumatic aortic (valve) stenosis: Secondary | ICD-10-CM | POA: Diagnosis not present

## 2019-12-10 DIAGNOSIS — I252 Old myocardial infarction: Secondary | ICD-10-CM | POA: Diagnosis not present

## 2019-12-10 DIAGNOSIS — E291 Testicular hypofunction: Secondary | ICD-10-CM | POA: Diagnosis not present

## 2019-12-10 DIAGNOSIS — N182 Chronic kidney disease, stage 2 (mild): Secondary | ICD-10-CM | POA: Diagnosis not present

## 2019-12-10 DIAGNOSIS — H9319 Tinnitus, unspecified ear: Secondary | ICD-10-CM | POA: Diagnosis not present

## 2019-12-10 DIAGNOSIS — I5020 Unspecified systolic (congestive) heart failure: Secondary | ICD-10-CM | POA: Diagnosis not present

## 2019-12-10 DIAGNOSIS — M810 Age-related osteoporosis without current pathological fracture: Secondary | ICD-10-CM | POA: Diagnosis not present

## 2019-12-10 DIAGNOSIS — J45909 Unspecified asthma, uncomplicated: Secondary | ICD-10-CM | POA: Diagnosis not present

## 2019-12-10 DIAGNOSIS — N1831 Chronic kidney disease, stage 3a: Secondary | ICD-10-CM | POA: Diagnosis not present

## 2019-12-10 DIAGNOSIS — Z85048 Personal history of other malignant neoplasm of rectum, rectosigmoid junction, and anus: Secondary | ICD-10-CM | POA: Diagnosis not present

## 2019-12-10 DIAGNOSIS — I4891 Unspecified atrial fibrillation: Secondary | ICD-10-CM | POA: Diagnosis not present

## 2019-12-10 DIAGNOSIS — Z0001 Encounter for general adult medical examination with abnormal findings: Secondary | ICD-10-CM | POA: Diagnosis not present

## 2019-12-10 DIAGNOSIS — C661 Malignant neoplasm of right ureter: Secondary | ICD-10-CM | POA: Diagnosis not present

## 2019-12-10 DIAGNOSIS — E782 Mixed hyperlipidemia: Secondary | ICD-10-CM | POA: Diagnosis not present

## 2019-12-10 DIAGNOSIS — E569 Vitamin deficiency, unspecified: Secondary | ICD-10-CM | POA: Diagnosis not present

## 2019-12-10 DIAGNOSIS — R7989 Other specified abnormal findings of blood chemistry: Secondary | ICD-10-CM | POA: Diagnosis not present

## 2019-12-10 DIAGNOSIS — Z9181 History of falling: Secondary | ICD-10-CM | POA: Diagnosis not present

## 2019-12-10 DIAGNOSIS — R946 Abnormal results of thyroid function studies: Secondary | ICD-10-CM | POA: Diagnosis not present

## 2019-12-10 DIAGNOSIS — M545 Low back pain: Secondary | ICD-10-CM | POA: Diagnosis not present

## 2019-12-10 DIAGNOSIS — N9089 Other specified noninflammatory disorders of vulva and perineum: Secondary | ICD-10-CM | POA: Diagnosis not present

## 2019-12-10 DIAGNOSIS — R5383 Other fatigue: Secondary | ICD-10-CM | POA: Diagnosis not present

## 2019-12-10 DIAGNOSIS — E038 Other specified hypothyroidism: Secondary | ICD-10-CM | POA: Diagnosis not present

## 2019-12-10 DIAGNOSIS — M9901 Segmental and somatic dysfunction of cervical region: Secondary | ICD-10-CM | POA: Diagnosis not present

## 2019-12-10 DIAGNOSIS — E7800 Pure hypercholesterolemia, unspecified: Secondary | ICD-10-CM | POA: Diagnosis not present

## 2019-12-10 DIAGNOSIS — L259 Unspecified contact dermatitis, unspecified cause: Secondary | ICD-10-CM | POA: Diagnosis not present

## 2019-12-10 DIAGNOSIS — Z66 Do not resuscitate: Secondary | ICD-10-CM | POA: Diagnosis not present

## 2019-12-10 DIAGNOSIS — R600 Localized edema: Secondary | ICD-10-CM | POA: Diagnosis not present

## 2019-12-10 DIAGNOSIS — M5116 Intervertebral disc disorders with radiculopathy, lumbar region: Secondary | ICD-10-CM | POA: Diagnosis not present

## 2019-12-10 DIAGNOSIS — F329 Major depressive disorder, single episode, unspecified: Secondary | ICD-10-CM | POA: Diagnosis not present

## 2019-12-10 DIAGNOSIS — Z96651 Presence of right artificial knee joint: Secondary | ICD-10-CM | POA: Diagnosis not present

## 2019-12-10 DIAGNOSIS — J069 Acute upper respiratory infection, unspecified: Secondary | ICD-10-CM | POA: Diagnosis not present

## 2019-12-10 DIAGNOSIS — I129 Hypertensive chronic kidney disease with stage 1 through stage 4 chronic kidney disease, or unspecified chronic kidney disease: Secondary | ICD-10-CM | POA: Diagnosis not present

## 2019-12-10 DIAGNOSIS — K219 Gastro-esophageal reflux disease without esophagitis: Secondary | ICD-10-CM | POA: Diagnosis not present

## 2019-12-10 DIAGNOSIS — M1712 Unilateral primary osteoarthritis, left knee: Secondary | ICD-10-CM | POA: Diagnosis not present

## 2019-12-10 DIAGNOSIS — Z87891 Personal history of nicotine dependence: Secondary | ICD-10-CM | POA: Diagnosis not present

## 2019-12-10 DIAGNOSIS — N179 Acute kidney failure, unspecified: Secondary | ICD-10-CM | POA: Diagnosis not present

## 2019-12-10 DIAGNOSIS — Z952 Presence of prosthetic heart valve: Secondary | ICD-10-CM | POA: Diagnosis not present

## 2019-12-10 DIAGNOSIS — N5201 Erectile dysfunction due to arterial insufficiency: Secondary | ICD-10-CM | POA: Diagnosis not present

## 2019-12-10 DIAGNOSIS — E876 Hypokalemia: Secondary | ICD-10-CM | POA: Diagnosis not present

## 2019-12-10 DIAGNOSIS — M531 Cervicobrachial syndrome: Secondary | ICD-10-CM | POA: Diagnosis not present

## 2019-12-10 DIAGNOSIS — Z7689 Persons encountering health services in other specified circumstances: Secondary | ICD-10-CM | POA: Diagnosis not present

## 2019-12-10 DIAGNOSIS — Z7901 Long term (current) use of anticoagulants: Secondary | ICD-10-CM | POA: Diagnosis not present

## 2019-12-10 DIAGNOSIS — L03115 Cellulitis of right lower limb: Secondary | ICD-10-CM | POA: Diagnosis not present

## 2019-12-10 DIAGNOSIS — R7301 Impaired fasting glucose: Secondary | ICD-10-CM | POA: Diagnosis not present

## 2019-12-10 DIAGNOSIS — I25119 Atherosclerotic heart disease of native coronary artery with unspecified angina pectoris: Secondary | ICD-10-CM | POA: Diagnosis not present

## 2019-12-10 DIAGNOSIS — E559 Vitamin D deficiency, unspecified: Secondary | ICD-10-CM | POA: Diagnosis not present

## 2019-12-10 DIAGNOSIS — R935 Abnormal findings on diagnostic imaging of other abdominal regions, including retroperitoneum: Secondary | ICD-10-CM | POA: Diagnosis not present

## 2019-12-10 DIAGNOSIS — R002 Palpitations: Secondary | ICD-10-CM | POA: Diagnosis not present

## 2019-12-10 DIAGNOSIS — J189 Pneumonia, unspecified organism: Secondary | ICD-10-CM | POA: Diagnosis not present

## 2019-12-10 DIAGNOSIS — R739 Hyperglycemia, unspecified: Secondary | ICD-10-CM | POA: Diagnosis not present

## 2019-12-10 DIAGNOSIS — J060 Acute laryngopharyngitis: Secondary | ICD-10-CM | POA: Diagnosis not present

## 2019-12-10 DIAGNOSIS — I482 Chronic atrial fibrillation, unspecified: Secondary | ICD-10-CM | POA: Diagnosis not present

## 2019-12-10 DIAGNOSIS — N189 Chronic kidney disease, unspecified: Secondary | ICD-10-CM | POA: Diagnosis not present

## 2019-12-10 DIAGNOSIS — I209 Angina pectoris, unspecified: Secondary | ICD-10-CM | POA: Diagnosis not present

## 2019-12-10 DIAGNOSIS — N939 Abnormal uterine and vaginal bleeding, unspecified: Secondary | ICD-10-CM | POA: Diagnosis not present

## 2019-12-10 DIAGNOSIS — R42 Dizziness and giddiness: Secondary | ICD-10-CM | POA: Diagnosis not present

## 2019-12-10 DIAGNOSIS — K5651 Intestinal adhesions [bands], with partial obstruction: Secondary | ICD-10-CM | POA: Diagnosis not present

## 2019-12-10 DIAGNOSIS — R2681 Unsteadiness on feet: Secondary | ICD-10-CM | POA: Diagnosis not present

## 2019-12-10 DIAGNOSIS — C9002 Multiple myeloma in relapse: Secondary | ICD-10-CM | POA: Diagnosis not present

## 2019-12-10 DIAGNOSIS — E119 Type 2 diabetes mellitus without complications: Secondary | ICD-10-CM | POA: Diagnosis not present

## 2019-12-10 DIAGNOSIS — R1013 Epigastric pain: Secondary | ICD-10-CM | POA: Diagnosis not present

## 2019-12-10 DIAGNOSIS — M25569 Pain in unspecified knee: Secondary | ICD-10-CM | POA: Diagnosis not present

## 2019-12-11 DIAGNOSIS — I452 Bifascicular block: Secondary | ICD-10-CM | POA: Diagnosis not present

## 2019-12-11 DIAGNOSIS — M549 Dorsalgia, unspecified: Secondary | ICD-10-CM | POA: Diagnosis not present

## 2019-12-11 DIAGNOSIS — R918 Other nonspecific abnormal finding of lung field: Secondary | ICD-10-CM | POA: Diagnosis not present

## 2019-12-11 DIAGNOSIS — R519 Headache, unspecified: Secondary | ICD-10-CM | POA: Diagnosis not present

## 2019-12-11 DIAGNOSIS — I2109 ST elevation (STEMI) myocardial infarction involving other coronary artery of anterior wall: Secondary | ICD-10-CM | POA: Diagnosis not present

## 2019-12-11 DIAGNOSIS — J9811 Atelectasis: Secondary | ICD-10-CM | POA: Diagnosis not present

## 2019-12-11 DIAGNOSIS — N281 Cyst of kidney, acquired: Secondary | ICD-10-CM | POA: Diagnosis not present

## 2019-12-11 DIAGNOSIS — Z5321 Procedure and treatment not carried out due to patient leaving prior to being seen by health care provider: Secondary | ICD-10-CM | POA: Diagnosis not present

## 2019-12-11 DIAGNOSIS — R300 Dysuria: Secondary | ICD-10-CM | POA: Diagnosis not present

## 2019-12-11 DIAGNOSIS — R531 Weakness: Secondary | ICD-10-CM | POA: Diagnosis not present

## 2019-12-11 DIAGNOSIS — I2119 ST elevation (STEMI) myocardial infarction involving other coronary artery of inferior wall: Secondary | ICD-10-CM | POA: Diagnosis not present

## 2019-12-11 DIAGNOSIS — K449 Diaphragmatic hernia without obstruction or gangrene: Secondary | ICD-10-CM | POA: Diagnosis not present

## 2019-12-11 DIAGNOSIS — I4510 Unspecified right bundle-branch block: Secondary | ICD-10-CM | POA: Diagnosis not present

## 2019-12-11 DIAGNOSIS — K8020 Calculus of gallbladder without cholecystitis without obstruction: Secondary | ICD-10-CM | POA: Diagnosis not present

## 2019-12-11 DIAGNOSIS — R109 Unspecified abdominal pain: Secondary | ICD-10-CM | POA: Diagnosis not present

## 2019-12-11 DIAGNOSIS — I444 Left anterior fascicular block: Secondary | ICD-10-CM | POA: Diagnosis not present

## 2019-12-11 DIAGNOSIS — R4182 Altered mental status, unspecified: Secondary | ICD-10-CM | POA: Diagnosis not present

## 2019-12-11 DIAGNOSIS — H11121 Conjunctival concretions, right eye: Secondary | ICD-10-CM | POA: Diagnosis not present

## 2019-12-11 DIAGNOSIS — R3989 Other symptoms and signs involving the genitourinary system: Secondary | ICD-10-CM | POA: Diagnosis not present

## 2019-12-12 DIAGNOSIS — R519 Headache, unspecified: Secondary | ICD-10-CM | POA: Diagnosis not present

## 2019-12-12 DIAGNOSIS — Z20822 Contact with and (suspected) exposure to covid-19: Secondary | ICD-10-CM | POA: Diagnosis not present

## 2019-12-12 DIAGNOSIS — J479 Bronchiectasis, uncomplicated: Secondary | ICD-10-CM | POA: Diagnosis not present

## 2019-12-13 DIAGNOSIS — Z86718 Personal history of other venous thrombosis and embolism: Secondary | ICD-10-CM | POA: Diagnosis not present

## 2019-12-13 DIAGNOSIS — L97521 Non-pressure chronic ulcer of other part of left foot limited to breakdown of skin: Secondary | ICD-10-CM | POA: Diagnosis not present

## 2019-12-13 DIAGNOSIS — J1282 Pneumonia due to coronavirus disease 2019: Secondary | ICD-10-CM | POA: Diagnosis not present

## 2019-12-13 DIAGNOSIS — R413 Other amnesia: Secondary | ICD-10-CM | POA: Diagnosis not present

## 2019-12-13 DIAGNOSIS — E211 Secondary hyperparathyroidism, not elsewhere classified: Secondary | ICD-10-CM | POA: Diagnosis not present

## 2019-12-13 DIAGNOSIS — E559 Vitamin D deficiency, unspecified: Secondary | ICD-10-CM | POA: Diagnosis not present

## 2019-12-13 DIAGNOSIS — H5213 Myopia, bilateral: Secondary | ICD-10-CM | POA: Diagnosis not present

## 2019-12-13 DIAGNOSIS — I4819 Other persistent atrial fibrillation: Secondary | ICD-10-CM | POA: Diagnosis not present

## 2019-12-13 DIAGNOSIS — M7989 Other specified soft tissue disorders: Secondary | ICD-10-CM | POA: Diagnosis not present

## 2019-12-13 DIAGNOSIS — E782 Mixed hyperlipidemia: Secondary | ICD-10-CM | POA: Diagnosis not present

## 2019-12-13 DIAGNOSIS — Z96611 Presence of right artificial shoulder joint: Secondary | ICD-10-CM | POA: Diagnosis not present

## 2019-12-13 DIAGNOSIS — K5901 Slow transit constipation: Secondary | ICD-10-CM | POA: Diagnosis not present

## 2019-12-13 DIAGNOSIS — M4620 Osteomyelitis of vertebra, site unspecified: Secondary | ICD-10-CM | POA: Diagnosis not present

## 2019-12-13 DIAGNOSIS — H52223 Regular astigmatism, bilateral: Secondary | ICD-10-CM | POA: Diagnosis not present

## 2019-12-13 DIAGNOSIS — I651 Occlusion and stenosis of basilar artery: Secondary | ICD-10-CM | POA: Diagnosis not present

## 2019-12-13 DIAGNOSIS — R739 Hyperglycemia, unspecified: Secondary | ICD-10-CM | POA: Diagnosis not present

## 2019-12-13 DIAGNOSIS — D631 Anemia in chronic kidney disease: Secondary | ICD-10-CM | POA: Diagnosis not present

## 2019-12-13 DIAGNOSIS — E663 Overweight: Secondary | ICD-10-CM | POA: Diagnosis not present

## 2019-12-13 DIAGNOSIS — M810 Age-related osteoporosis without current pathological fracture: Secondary | ICD-10-CM | POA: Diagnosis not present

## 2019-12-13 DIAGNOSIS — Z9989 Dependence on other enabling machines and devices: Secondary | ICD-10-CM | POA: Diagnosis not present

## 2019-12-13 DIAGNOSIS — D649 Anemia, unspecified: Secondary | ICD-10-CM | POA: Diagnosis not present

## 2019-12-13 DIAGNOSIS — J9621 Acute and chronic respiratory failure with hypoxia: Secondary | ICD-10-CM | POA: Diagnosis not present

## 2019-12-13 DIAGNOSIS — Z1159 Encounter for screening for other viral diseases: Secondary | ICD-10-CM | POA: Diagnosis not present

## 2019-12-13 DIAGNOSIS — M109 Gout, unspecified: Secondary | ICD-10-CM | POA: Diagnosis not present

## 2019-12-13 DIAGNOSIS — S3992XA Unspecified injury of lower back, initial encounter: Secondary | ICD-10-CM | POA: Diagnosis not present

## 2019-12-13 DIAGNOSIS — E875 Hyperkalemia: Secondary | ICD-10-CM | POA: Diagnosis not present

## 2019-12-13 DIAGNOSIS — H2511 Age-related nuclear cataract, right eye: Secondary | ICD-10-CM | POA: Diagnosis not present

## 2019-12-13 DIAGNOSIS — J42 Unspecified chronic bronchitis: Secondary | ICD-10-CM | POA: Diagnosis not present

## 2019-12-13 DIAGNOSIS — I25110 Atherosclerotic heart disease of native coronary artery with unstable angina pectoris: Secondary | ICD-10-CM | POA: Diagnosis not present

## 2019-12-13 DIAGNOSIS — M47812 Spondylosis without myelopathy or radiculopathy, cervical region: Secondary | ICD-10-CM | POA: Diagnosis not present

## 2019-12-13 DIAGNOSIS — N1330 Unspecified hydronephrosis: Secondary | ICD-10-CM | POA: Diagnosis not present

## 2019-12-13 DIAGNOSIS — Z1329 Encounter for screening for other suspected endocrine disorder: Secondary | ICD-10-CM | POA: Diagnosis not present

## 2019-12-13 DIAGNOSIS — Z96653 Presence of artificial knee joint, bilateral: Secondary | ICD-10-CM | POA: Diagnosis not present

## 2019-12-13 DIAGNOSIS — Z9114 Patient's other noncompliance with medication regimen: Secondary | ICD-10-CM | POA: Diagnosis not present

## 2019-12-13 DIAGNOSIS — Z23 Encounter for immunization: Secondary | ICD-10-CM | POA: Diagnosis not present

## 2019-12-13 DIAGNOSIS — M76821 Posterior tibial tendinitis, right leg: Secondary | ICD-10-CM | POA: Diagnosis not present

## 2019-12-13 DIAGNOSIS — T8189XA Other complications of procedures, not elsewhere classified, initial encounter: Secondary | ICD-10-CM | POA: Diagnosis not present

## 2019-12-13 DIAGNOSIS — N3090 Cystitis, unspecified without hematuria: Secondary | ICD-10-CM | POA: Diagnosis not present

## 2019-12-13 DIAGNOSIS — R3129 Other microscopic hematuria: Secondary | ICD-10-CM | POA: Diagnosis not present

## 2019-12-13 DIAGNOSIS — E789 Disorder of lipoprotein metabolism, unspecified: Secondary | ICD-10-CM | POA: Diagnosis not present

## 2019-12-13 DIAGNOSIS — Z885 Allergy status to narcotic agent status: Secondary | ICD-10-CM | POA: Diagnosis not present

## 2019-12-13 DIAGNOSIS — R627 Adult failure to thrive: Secondary | ICD-10-CM | POA: Diagnosis not present

## 2019-12-13 DIAGNOSIS — I25118 Atherosclerotic heart disease of native coronary artery with other forms of angina pectoris: Secondary | ICD-10-CM | POA: Diagnosis not present

## 2019-12-13 DIAGNOSIS — E038 Other specified hypothyroidism: Secondary | ICD-10-CM | POA: Diagnosis not present

## 2019-12-13 DIAGNOSIS — R600 Localized edema: Secondary | ICD-10-CM | POA: Diagnosis not present

## 2019-12-13 DIAGNOSIS — N390 Urinary tract infection, site not specified: Secondary | ICD-10-CM | POA: Diagnosis not present

## 2019-12-13 DIAGNOSIS — N183 Chronic kidney disease, stage 3 unspecified: Secondary | ICD-10-CM | POA: Diagnosis not present

## 2019-12-13 DIAGNOSIS — E1140 Type 2 diabetes mellitus with diabetic neuropathy, unspecified: Secondary | ICD-10-CM | POA: Diagnosis not present

## 2019-12-13 DIAGNOSIS — R319 Hematuria, unspecified: Secondary | ICD-10-CM | POA: Diagnosis not present

## 2019-12-13 DIAGNOSIS — M1712 Unilateral primary osteoarthritis, left knee: Secondary | ICD-10-CM | POA: Diagnosis not present

## 2019-12-13 DIAGNOSIS — E210 Primary hyperparathyroidism: Secondary | ICD-10-CM | POA: Diagnosis not present

## 2019-12-13 DIAGNOSIS — G4730 Sleep apnea, unspecified: Secondary | ICD-10-CM | POA: Diagnosis not present

## 2019-12-13 DIAGNOSIS — G4733 Obstructive sleep apnea (adult) (pediatric): Secondary | ICD-10-CM | POA: Diagnosis not present

## 2019-12-13 DIAGNOSIS — R82998 Other abnormal findings in urine: Secondary | ICD-10-CM | POA: Diagnosis not present

## 2019-12-13 DIAGNOSIS — E119 Type 2 diabetes mellitus without complications: Secondary | ICD-10-CM | POA: Diagnosis not present

## 2019-12-13 DIAGNOSIS — K5904 Chronic idiopathic constipation: Secondary | ICD-10-CM | POA: Diagnosis not present

## 2019-12-13 DIAGNOSIS — M1711 Unilateral primary osteoarthritis, right knee: Secondary | ICD-10-CM | POA: Diagnosis not present

## 2019-12-13 DIAGNOSIS — D6859 Other primary thrombophilia: Secondary | ICD-10-CM | POA: Diagnosis not present

## 2019-12-13 DIAGNOSIS — N1832 Chronic kidney disease, stage 3b: Secondary | ICD-10-CM | POA: Diagnosis not present

## 2019-12-13 DIAGNOSIS — J9622 Acute and chronic respiratory failure with hypercapnia: Secondary | ICD-10-CM | POA: Diagnosis not present

## 2019-12-13 DIAGNOSIS — F329 Major depressive disorder, single episode, unspecified: Secondary | ICD-10-CM | POA: Diagnosis not present

## 2019-12-13 DIAGNOSIS — D630 Anemia in neoplastic disease: Secondary | ICD-10-CM | POA: Diagnosis not present

## 2019-12-13 DIAGNOSIS — M1000 Idiopathic gout, unspecified site: Secondary | ICD-10-CM | POA: Diagnosis not present

## 2019-12-13 DIAGNOSIS — M2550 Pain in unspecified joint: Secondary | ICD-10-CM | POA: Diagnosis not present

## 2019-12-13 DIAGNOSIS — R197 Diarrhea, unspecified: Secondary | ICD-10-CM | POA: Diagnosis not present

## 2019-12-13 DIAGNOSIS — E872 Acidosis: Secondary | ICD-10-CM | POA: Diagnosis not present

## 2019-12-13 DIAGNOSIS — Z01810 Encounter for preprocedural cardiovascular examination: Secondary | ICD-10-CM | POA: Diagnosis not present

## 2019-12-13 DIAGNOSIS — J189 Pneumonia, unspecified organism: Secondary | ICD-10-CM | POA: Diagnosis not present

## 2019-12-13 DIAGNOSIS — M8580 Other specified disorders of bone density and structure, unspecified site: Secondary | ICD-10-CM | POA: Diagnosis not present

## 2019-12-13 DIAGNOSIS — Z720 Tobacco use: Secondary | ICD-10-CM | POA: Diagnosis not present

## 2019-12-13 DIAGNOSIS — D518 Other vitamin B12 deficiency anemias: Secondary | ICD-10-CM | POA: Diagnosis not present

## 2019-12-13 DIAGNOSIS — Z8616 Personal history of COVID-19: Secondary | ICD-10-CM | POA: Diagnosis not present

## 2019-12-13 DIAGNOSIS — T380X5A Adverse effect of glucocorticoids and synthetic analogues, initial encounter: Secondary | ICD-10-CM | POA: Diagnosis not present

## 2019-12-13 DIAGNOSIS — Z888 Allergy status to other drugs, medicaments and biological substances status: Secondary | ICD-10-CM | POA: Diagnosis not present

## 2019-12-13 DIAGNOSIS — I5033 Acute on chronic diastolic (congestive) heart failure: Secondary | ICD-10-CM | POA: Diagnosis not present

## 2019-12-13 DIAGNOSIS — E1129 Type 2 diabetes mellitus with other diabetic kidney complication: Secondary | ICD-10-CM | POA: Diagnosis not present

## 2019-12-13 DIAGNOSIS — R768 Other specified abnormal immunological findings in serum: Secondary | ICD-10-CM | POA: Diagnosis not present

## 2019-12-13 DIAGNOSIS — N3020 Other chronic cystitis without hematuria: Secondary | ICD-10-CM | POA: Diagnosis not present

## 2019-12-13 DIAGNOSIS — E871 Hypo-osmolality and hyponatremia: Secondary | ICD-10-CM | POA: Diagnosis not present

## 2019-12-13 DIAGNOSIS — I482 Chronic atrial fibrillation, unspecified: Secondary | ICD-10-CM | POA: Diagnosis not present

## 2019-12-13 DIAGNOSIS — I5032 Chronic diastolic (congestive) heart failure: Secondary | ICD-10-CM | POA: Diagnosis not present

## 2019-12-13 DIAGNOSIS — Z8739 Personal history of other diseases of the musculoskeletal system and connective tissue: Secondary | ICD-10-CM | POA: Diagnosis not present

## 2019-12-13 DIAGNOSIS — R042 Hemoptysis: Secondary | ICD-10-CM | POA: Diagnosis not present

## 2019-12-13 DIAGNOSIS — F0150 Vascular dementia without behavioral disturbance: Secondary | ICD-10-CM | POA: Diagnosis not present

## 2019-12-13 DIAGNOSIS — Z6826 Body mass index (BMI) 26.0-26.9, adult: Secondary | ICD-10-CM | POA: Diagnosis not present

## 2019-12-13 DIAGNOSIS — N182 Chronic kidney disease, stage 2 (mild): Secondary | ICD-10-CM | POA: Diagnosis not present

## 2019-12-13 DIAGNOSIS — R011 Cardiac murmur, unspecified: Secondary | ICD-10-CM | POA: Diagnosis not present

## 2019-12-13 DIAGNOSIS — R918 Other nonspecific abnormal finding of lung field: Secondary | ICD-10-CM | POA: Diagnosis not present

## 2019-12-13 DIAGNOSIS — K5900 Constipation, unspecified: Secondary | ICD-10-CM | POA: Diagnosis not present

## 2019-12-13 DIAGNOSIS — J40 Bronchitis, not specified as acute or chronic: Secondary | ICD-10-CM | POA: Diagnosis not present

## 2019-12-13 DIAGNOSIS — N281 Cyst of kidney, acquired: Secondary | ICD-10-CM | POA: Diagnosis not present

## 2019-12-13 DIAGNOSIS — Z6830 Body mass index (BMI) 30.0-30.9, adult: Secondary | ICD-10-CM | POA: Diagnosis not present

## 2019-12-13 DIAGNOSIS — K120 Recurrent oral aphthae: Secondary | ICD-10-CM | POA: Diagnosis not present

## 2019-12-13 DIAGNOSIS — T501X6A Underdosing of loop [high-ceiling] diuretics, initial encounter: Secondary | ICD-10-CM | POA: Diagnosis not present

## 2019-12-13 DIAGNOSIS — U071 COVID-19: Secondary | ICD-10-CM | POA: Diagnosis not present

## 2019-12-13 DIAGNOSIS — G931 Anoxic brain damage, not elsewhere classified: Secondary | ICD-10-CM | POA: Diagnosis not present

## 2019-12-13 DIAGNOSIS — M25561 Pain in right knee: Secondary | ICD-10-CM | POA: Diagnosis not present

## 2019-12-13 DIAGNOSIS — Z712 Person consulting for explanation of examination or test findings: Secondary | ICD-10-CM | POA: Diagnosis not present

## 2019-12-13 DIAGNOSIS — K641 Second degree hemorrhoids: Secondary | ICD-10-CM | POA: Diagnosis not present

## 2019-12-13 DIAGNOSIS — M549 Dorsalgia, unspecified: Secondary | ICD-10-CM | POA: Diagnosis not present

## 2019-12-13 DIAGNOSIS — N186 End stage renal disease: Secondary | ICD-10-CM | POA: Diagnosis not present

## 2019-12-13 DIAGNOSIS — I700 Atherosclerosis of aorta: Secondary | ICD-10-CM | POA: Diagnosis not present

## 2019-12-13 DIAGNOSIS — E1121 Type 2 diabetes mellitus with diabetic nephropathy: Secondary | ICD-10-CM | POA: Diagnosis not present

## 2019-12-13 DIAGNOSIS — L89310 Pressure ulcer of right buttock, unstageable: Secondary | ICD-10-CM | POA: Diagnosis not present

## 2019-12-13 DIAGNOSIS — G43909 Migraine, unspecified, not intractable, without status migrainosus: Secondary | ICD-10-CM | POA: Diagnosis not present

## 2019-12-13 DIAGNOSIS — E1159 Type 2 diabetes mellitus with other circulatory complications: Secondary | ICD-10-CM | POA: Diagnosis not present

## 2019-12-13 DIAGNOSIS — Z940 Kidney transplant status: Secondary | ICD-10-CM | POA: Diagnosis not present

## 2019-12-13 DIAGNOSIS — E1165 Type 2 diabetes mellitus with hyperglycemia: Secondary | ICD-10-CM | POA: Diagnosis not present

## 2019-12-13 DIAGNOSIS — I252 Old myocardial infarction: Secondary | ICD-10-CM | POA: Diagnosis not present

## 2019-12-13 DIAGNOSIS — Z96651 Presence of right artificial knee joint: Secondary | ICD-10-CM | POA: Diagnosis not present

## 2019-12-13 DIAGNOSIS — N184 Chronic kidney disease, stage 4 (severe): Secondary | ICD-10-CM | POA: Diagnosis not present

## 2019-12-13 DIAGNOSIS — R2681 Unsteadiness on feet: Secondary | ICD-10-CM | POA: Diagnosis not present

## 2019-12-13 DIAGNOSIS — E7849 Other hyperlipidemia: Secondary | ICD-10-CM | POA: Diagnosis not present

## 2019-12-13 DIAGNOSIS — M898X9 Other specified disorders of bone, unspecified site: Secondary | ICD-10-CM | POA: Diagnosis not present

## 2019-12-13 DIAGNOSIS — M25562 Pain in left knee: Secondary | ICD-10-CM | POA: Diagnosis not present

## 2019-12-13 DIAGNOSIS — M47816 Spondylosis without myelopathy or radiculopathy, lumbar region: Secondary | ICD-10-CM | POA: Diagnosis not present

## 2019-12-13 DIAGNOSIS — R6884 Jaw pain: Secondary | ICD-10-CM | POA: Diagnosis not present

## 2019-12-13 DIAGNOSIS — K5732 Diverticulitis of large intestine without perforation or abscess without bleeding: Secondary | ICD-10-CM | POA: Diagnosis not present

## 2019-12-13 DIAGNOSIS — K219 Gastro-esophageal reflux disease without esophagitis: Secondary | ICD-10-CM | POA: Diagnosis not present

## 2019-12-13 DIAGNOSIS — E876 Hypokalemia: Secondary | ICD-10-CM | POA: Diagnosis not present

## 2019-12-13 DIAGNOSIS — D500 Iron deficiency anemia secondary to blood loss (chronic): Secondary | ICD-10-CM | POA: Diagnosis not present

## 2019-12-13 DIAGNOSIS — E7800 Pure hypercholesterolemia, unspecified: Secondary | ICD-10-CM | POA: Diagnosis not present

## 2019-12-13 DIAGNOSIS — J441 Chronic obstructive pulmonary disease with (acute) exacerbation: Secondary | ICD-10-CM | POA: Diagnosis not present

## 2019-12-13 DIAGNOSIS — R21 Rash and other nonspecific skin eruption: Secondary | ICD-10-CM | POA: Diagnosis not present

## 2019-12-13 DIAGNOSIS — R0989 Other specified symptoms and signs involving the circulatory and respiratory systems: Secondary | ICD-10-CM | POA: Diagnosis not present

## 2019-12-13 DIAGNOSIS — R410 Disorientation, unspecified: Secondary | ICD-10-CM | POA: Diagnosis not present

## 2019-12-13 DIAGNOSIS — M0589 Other rheumatoid arthritis with rheumatoid factor of multiple sites: Secondary | ICD-10-CM | POA: Diagnosis not present

## 2019-12-13 DIAGNOSIS — Z6833 Body mass index (BMI) 33.0-33.9, adult: Secondary | ICD-10-CM | POA: Diagnosis not present

## 2019-12-13 DIAGNOSIS — Z8546 Personal history of malignant neoplasm of prostate: Secondary | ICD-10-CM | POA: Diagnosis not present

## 2019-12-13 DIAGNOSIS — Z20828 Contact with and (suspected) exposure to other viral communicable diseases: Secondary | ICD-10-CM | POA: Diagnosis not present

## 2019-12-13 DIAGNOSIS — R509 Fever, unspecified: Secondary | ICD-10-CM | POA: Diagnosis not present

## 2019-12-13 DIAGNOSIS — I132 Hypertensive heart and chronic kidney disease with heart failure and with stage 5 chronic kidney disease, or end stage renal disease: Secondary | ICD-10-CM | POA: Diagnosis not present

## 2019-12-13 DIAGNOSIS — E441 Mild protein-calorie malnutrition: Secondary | ICD-10-CM | POA: Diagnosis not present

## 2019-12-13 DIAGNOSIS — G9009 Other idiopathic peripheral autonomic neuropathy: Secondary | ICD-10-CM | POA: Diagnosis not present

## 2019-12-13 DIAGNOSIS — C7A090 Malignant carcinoid tumor of the bronchus and lung: Secondary | ICD-10-CM | POA: Diagnosis not present

## 2019-12-13 DIAGNOSIS — M797 Fibromyalgia: Secondary | ICD-10-CM | POA: Diagnosis not present

## 2019-12-13 DIAGNOSIS — E890 Postprocedural hypothyroidism: Secondary | ICD-10-CM | POA: Diagnosis not present

## 2019-12-13 DIAGNOSIS — N200 Calculus of kidney: Secondary | ICD-10-CM | POA: Diagnosis not present

## 2019-12-13 DIAGNOSIS — D519 Vitamin B12 deficiency anemia, unspecified: Secondary | ICD-10-CM | POA: Diagnosis not present

## 2019-12-13 DIAGNOSIS — C61 Malignant neoplasm of prostate: Secondary | ICD-10-CM | POA: Diagnosis not present

## 2019-12-13 DIAGNOSIS — K582 Mixed irritable bowel syndrome: Secondary | ICD-10-CM | POA: Diagnosis not present

## 2019-12-13 DIAGNOSIS — Z1389 Encounter for screening for other disorder: Secondary | ICD-10-CM | POA: Diagnosis not present

## 2019-12-13 DIAGNOSIS — I499 Cardiac arrhythmia, unspecified: Secondary | ICD-10-CM | POA: Diagnosis not present

## 2019-12-13 DIAGNOSIS — A4189 Other specified sepsis: Secondary | ICD-10-CM | POA: Diagnosis not present

## 2019-12-13 DIAGNOSIS — Z79899 Other long term (current) drug therapy: Secondary | ICD-10-CM | POA: Diagnosis not present

## 2019-12-13 DIAGNOSIS — Z90721 Acquired absence of ovaries, unilateral: Secondary | ICD-10-CM | POA: Diagnosis not present

## 2019-12-13 DIAGNOSIS — M1990 Unspecified osteoarthritis, unspecified site: Secondary | ICD-10-CM | POA: Diagnosis not present

## 2019-12-13 DIAGNOSIS — N189 Chronic kidney disease, unspecified: Secondary | ICD-10-CM | POA: Diagnosis not present

## 2019-12-13 DIAGNOSIS — N3943 Post-void dribbling: Secondary | ICD-10-CM | POA: Diagnosis not present

## 2019-12-13 DIAGNOSIS — D638 Anemia in other chronic diseases classified elsewhere: Secondary | ICD-10-CM | POA: Diagnosis not present

## 2019-12-13 DIAGNOSIS — Z7952 Long term (current) use of systemic steroids: Secondary | ICD-10-CM | POA: Diagnosis not present

## 2019-12-13 DIAGNOSIS — S299XXA Unspecified injury of thorax, initial encounter: Secondary | ICD-10-CM | POA: Diagnosis not present

## 2019-12-13 DIAGNOSIS — K5730 Diverticulosis of large intestine without perforation or abscess without bleeding: Secondary | ICD-10-CM | POA: Diagnosis not present

## 2019-12-13 DIAGNOSIS — Z0001 Encounter for general adult medical examination with abnormal findings: Secondary | ICD-10-CM | POA: Diagnosis not present

## 2019-12-13 DIAGNOSIS — Z09 Encounter for follow-up examination after completed treatment for conditions other than malignant neoplasm: Secondary | ICD-10-CM | POA: Diagnosis not present

## 2019-12-13 DIAGNOSIS — R944 Abnormal results of kidney function studies: Secondary | ICD-10-CM | POA: Diagnosis not present

## 2019-12-13 DIAGNOSIS — N1831 Chronic kidney disease, stage 3a: Secondary | ICD-10-CM | POA: Diagnosis not present

## 2019-12-13 DIAGNOSIS — Z96652 Presence of left artificial knee joint: Secondary | ICD-10-CM | POA: Diagnosis not present

## 2019-12-13 DIAGNOSIS — R7989 Other specified abnormal findings of blood chemistry: Secondary | ICD-10-CM | POA: Diagnosis not present

## 2019-12-13 DIAGNOSIS — J440 Chronic obstructive pulmonary disease with acute lower respiratory infection: Secondary | ICD-10-CM | POA: Diagnosis not present

## 2019-12-13 DIAGNOSIS — C9110 Chronic lymphocytic leukemia of B-cell type not having achieved remission: Secondary | ICD-10-CM | POA: Diagnosis not present

## 2019-12-13 DIAGNOSIS — M8588 Other specified disorders of bone density and structure, other site: Secondary | ICD-10-CM | POA: Diagnosis not present

## 2019-12-13 DIAGNOSIS — D696 Thrombocytopenia, unspecified: Secondary | ICD-10-CM | POA: Diagnosis not present

## 2019-12-13 DIAGNOSIS — N3001 Acute cystitis with hematuria: Secondary | ICD-10-CM | POA: Diagnosis not present

## 2019-12-13 DIAGNOSIS — I110 Hypertensive heart disease with heart failure: Secondary | ICD-10-CM | POA: Diagnosis not present

## 2019-12-13 DIAGNOSIS — I739 Peripheral vascular disease, unspecified: Secondary | ICD-10-CM | POA: Diagnosis not present

## 2019-12-13 DIAGNOSIS — I9589 Other hypotension: Secondary | ICD-10-CM | POA: Diagnosis not present

## 2019-12-13 DIAGNOSIS — I493 Ventricular premature depolarization: Secondary | ICD-10-CM | POA: Diagnosis not present

## 2019-12-13 DIAGNOSIS — T8131XA Disruption of external operation (surgical) wound, not elsewhere classified, initial encounter: Secondary | ICD-10-CM | POA: Diagnosis not present

## 2019-12-13 DIAGNOSIS — C50412 Malignant neoplasm of upper-outer quadrant of left female breast: Secondary | ICD-10-CM | POA: Diagnosis not present

## 2019-12-13 DIAGNOSIS — D530 Protein deficiency anemia: Secondary | ICD-10-CM | POA: Diagnosis not present

## 2019-12-13 DIAGNOSIS — R5383 Other fatigue: Secondary | ICD-10-CM | POA: Diagnosis not present

## 2019-12-13 DIAGNOSIS — J449 Chronic obstructive pulmonary disease, unspecified: Secondary | ICD-10-CM | POA: Diagnosis not present

## 2019-12-13 DIAGNOSIS — N179 Acute kidney failure, unspecified: Secondary | ICD-10-CM | POA: Diagnosis not present

## 2019-12-13 DIAGNOSIS — G4700 Insomnia, unspecified: Secondary | ICD-10-CM | POA: Diagnosis not present

## 2019-12-13 DIAGNOSIS — Z20822 Contact with and (suspected) exposure to covid-19: Secondary | ICD-10-CM | POA: Diagnosis not present

## 2019-12-13 DIAGNOSIS — R42 Dizziness and giddiness: Secondary | ICD-10-CM | POA: Diagnosis not present

## 2019-12-13 DIAGNOSIS — D509 Iron deficiency anemia, unspecified: Secondary | ICD-10-CM | POA: Diagnosis not present

## 2019-12-13 DIAGNOSIS — K633 Ulcer of intestine: Secondary | ICD-10-CM | POA: Diagnosis not present

## 2019-12-13 DIAGNOSIS — Z0189 Encounter for other specified special examinations: Secondary | ICD-10-CM | POA: Diagnosis not present

## 2019-12-13 DIAGNOSIS — J309 Allergic rhinitis, unspecified: Secondary | ICD-10-CM | POA: Diagnosis not present

## 2019-12-13 DIAGNOSIS — I10 Essential (primary) hypertension: Secondary | ICD-10-CM | POA: Diagnosis not present

## 2019-12-13 DIAGNOSIS — I272 Pulmonary hypertension, unspecified: Secondary | ICD-10-CM | POA: Diagnosis not present

## 2019-12-13 DIAGNOSIS — I2510 Atherosclerotic heart disease of native coronary artery without angina pectoris: Secondary | ICD-10-CM | POA: Diagnosis not present

## 2019-12-13 DIAGNOSIS — N400 Enlarged prostate without lower urinary tract symptoms: Secondary | ICD-10-CM | POA: Diagnosis not present

## 2019-12-13 DIAGNOSIS — K5720 Diverticulitis of large intestine with perforation and abscess without bleeding: Secondary | ICD-10-CM | POA: Diagnosis not present

## 2019-12-13 DIAGNOSIS — Z881 Allergy status to other antibiotic agents status: Secondary | ICD-10-CM | POA: Diagnosis not present

## 2019-12-13 DIAGNOSIS — R972 Elevated prostate specific antigen [PSA]: Secondary | ICD-10-CM | POA: Diagnosis not present

## 2019-12-13 DIAGNOSIS — Z1321 Encounter for screening for nutritional disorder: Secondary | ICD-10-CM | POA: Diagnosis not present

## 2019-12-13 DIAGNOSIS — E1122 Type 2 diabetes mellitus with diabetic chronic kidney disease: Secondary | ICD-10-CM | POA: Diagnosis not present

## 2019-12-13 DIAGNOSIS — N401 Enlarged prostate with lower urinary tract symptoms: Secondary | ICD-10-CM | POA: Diagnosis not present

## 2019-12-13 DIAGNOSIS — Z6829 Body mass index (BMI) 29.0-29.9, adult: Secondary | ICD-10-CM | POA: Diagnosis not present

## 2019-12-13 DIAGNOSIS — Z87891 Personal history of nicotine dependence: Secondary | ICD-10-CM | POA: Diagnosis not present

## 2019-12-13 DIAGNOSIS — I6350 Cerebral infarction due to unspecified occlusion or stenosis of unspecified cerebral artery: Secondary | ICD-10-CM | POA: Diagnosis not present

## 2019-12-13 DIAGNOSIS — E039 Hypothyroidism, unspecified: Secondary | ICD-10-CM | POA: Diagnosis not present

## 2019-12-13 DIAGNOSIS — Z136 Encounter for screening for cardiovascular disorders: Secondary | ICD-10-CM | POA: Diagnosis not present

## 2019-12-13 DIAGNOSIS — R7302 Impaired glucose tolerance (oral): Secondary | ICD-10-CM | POA: Diagnosis not present

## 2019-12-13 DIAGNOSIS — I498 Other specified cardiac arrhythmias: Secondary | ICD-10-CM | POA: Diagnosis not present

## 2019-12-13 DIAGNOSIS — R809 Proteinuria, unspecified: Secondary | ICD-10-CM | POA: Diagnosis not present

## 2019-12-13 DIAGNOSIS — I4891 Unspecified atrial fibrillation: Secondary | ICD-10-CM | POA: Diagnosis not present

## 2019-12-13 DIAGNOSIS — R4782 Fluency disorder in conditions classified elsewhere: Secondary | ICD-10-CM | POA: Diagnosis not present

## 2019-12-13 DIAGNOSIS — R7301 Impaired fasting glucose: Secondary | ICD-10-CM | POA: Diagnosis not present

## 2019-12-13 DIAGNOSIS — R519 Headache, unspecified: Secondary | ICD-10-CM | POA: Diagnosis not present

## 2019-12-13 DIAGNOSIS — I480 Paroxysmal atrial fibrillation: Secondary | ICD-10-CM | POA: Diagnosis not present

## 2019-12-13 DIAGNOSIS — K2100 Gastro-esophageal reflux disease with esophagitis, without bleeding: Secondary | ICD-10-CM | POA: Diagnosis not present

## 2019-12-13 DIAGNOSIS — K529 Noninfective gastroenteritis and colitis, unspecified: Secondary | ICD-10-CM | POA: Diagnosis not present

## 2019-12-13 DIAGNOSIS — R7303 Prediabetes: Secondary | ICD-10-CM | POA: Diagnosis not present

## 2019-12-13 DIAGNOSIS — K521 Toxic gastroenteritis and colitis: Secondary | ICD-10-CM | POA: Diagnosis not present

## 2019-12-13 DIAGNOSIS — Z0000 Encounter for general adult medical examination without abnormal findings: Secondary | ICD-10-CM | POA: Diagnosis not present

## 2019-12-13 DIAGNOSIS — I129 Hypertensive chronic kidney disease with stage 1 through stage 4 chronic kidney disease, or unspecified chronic kidney disease: Secondary | ICD-10-CM | POA: Diagnosis not present

## 2019-12-13 DIAGNOSIS — H524 Presbyopia: Secondary | ICD-10-CM | POA: Diagnosis not present

## 2019-12-13 DIAGNOSIS — M542 Cervicalgia: Secondary | ICD-10-CM | POA: Diagnosis not present

## 2019-12-13 DIAGNOSIS — Z125 Encounter for screening for malignant neoplasm of prostate: Secondary | ICD-10-CM | POA: Diagnosis not present

## 2019-12-13 DIAGNOSIS — M6281 Muscle weakness (generalized): Secondary | ICD-10-CM | POA: Diagnosis not present

## 2019-12-13 DIAGNOSIS — R7309 Other abnormal glucose: Secondary | ICD-10-CM | POA: Diagnosis not present

## 2019-12-13 DIAGNOSIS — F0280 Dementia in other diseases classified elsewhere without behavioral disturbance: Secondary | ICD-10-CM | POA: Diagnosis not present

## 2019-12-13 DIAGNOSIS — E1169 Type 2 diabetes mellitus with other specified complication: Secondary | ICD-10-CM | POA: Diagnosis not present

## 2019-12-13 DIAGNOSIS — Z08 Encounter for follow-up examination after completed treatment for malignant neoplasm: Secondary | ICD-10-CM | POA: Diagnosis not present

## 2019-12-13 DIAGNOSIS — Z6832 Body mass index (BMI) 32.0-32.9, adult: Secondary | ICD-10-CM | POA: Diagnosis not present

## 2019-12-13 DIAGNOSIS — R05 Cough: Secondary | ICD-10-CM | POA: Diagnosis not present

## 2019-12-13 DIAGNOSIS — Z13220 Encounter for screening for lipoid disorders: Secondary | ICD-10-CM | POA: Diagnosis not present

## 2019-12-13 DIAGNOSIS — C187 Malignant neoplasm of sigmoid colon: Secondary | ICD-10-CM | POA: Diagnosis not present

## 2019-12-13 DIAGNOSIS — M170 Bilateral primary osteoarthritis of knee: Secondary | ICD-10-CM | POA: Diagnosis not present

## 2019-12-13 DIAGNOSIS — Z853 Personal history of malignant neoplasm of breast: Secondary | ICD-10-CM | POA: Diagnosis not present

## 2019-12-13 DIAGNOSIS — R6520 Severe sepsis without septic shock: Secondary | ICD-10-CM | POA: Diagnosis not present

## 2019-12-13 DIAGNOSIS — M961 Postlaminectomy syndrome, not elsewhere classified: Secondary | ICD-10-CM | POA: Diagnosis not present

## 2019-12-13 DIAGNOSIS — N23 Unspecified renal colic: Secondary | ICD-10-CM | POA: Diagnosis not present

## 2019-12-13 DIAGNOSIS — E860 Dehydration: Secondary | ICD-10-CM | POA: Diagnosis not present

## 2019-12-13 DIAGNOSIS — F419 Anxiety disorder, unspecified: Secondary | ICD-10-CM | POA: Diagnosis not present

## 2019-12-13 DIAGNOSIS — E785 Hyperlipidemia, unspecified: Secondary | ICD-10-CM | POA: Diagnosis not present

## 2019-12-13 DIAGNOSIS — Z7982 Long term (current) use of aspirin: Secondary | ICD-10-CM | POA: Diagnosis not present

## 2019-12-14 DIAGNOSIS — J9601 Acute respiratory failure with hypoxia: Secondary | ICD-10-CM | POA: Diagnosis not present

## 2019-12-14 DIAGNOSIS — I5032 Chronic diastolic (congestive) heart failure: Secondary | ICD-10-CM | POA: Diagnosis not present

## 2019-12-14 DIAGNOSIS — Z125 Encounter for screening for malignant neoplasm of prostate: Secondary | ICD-10-CM | POA: Diagnosis not present

## 2019-12-14 DIAGNOSIS — Z0001 Encounter for general adult medical examination with abnormal findings: Secondary | ICD-10-CM | POA: Diagnosis not present

## 2019-12-14 DIAGNOSIS — E1149 Type 2 diabetes mellitus with other diabetic neurological complication: Secondary | ICD-10-CM | POA: Diagnosis not present

## 2019-12-14 DIAGNOSIS — E11610 Type 2 diabetes mellitus with diabetic neuropathic arthropathy: Secondary | ICD-10-CM | POA: Diagnosis not present

## 2019-12-14 DIAGNOSIS — R350 Frequency of micturition: Secondary | ICD-10-CM | POA: Diagnosis not present

## 2019-12-14 DIAGNOSIS — N1831 Chronic kidney disease, stage 3a: Secondary | ICD-10-CM | POA: Diagnosis not present

## 2019-12-14 DIAGNOSIS — R338 Other retention of urine: Secondary | ICD-10-CM | POA: Diagnosis not present

## 2019-12-14 DIAGNOSIS — H25812 Combined forms of age-related cataract, left eye: Secondary | ICD-10-CM | POA: Diagnosis not present

## 2019-12-14 DIAGNOSIS — E1169 Type 2 diabetes mellitus with other specified complication: Secondary | ICD-10-CM | POA: Diagnosis not present

## 2019-12-14 DIAGNOSIS — L400 Psoriasis vulgaris: Secondary | ICD-10-CM | POA: Diagnosis not present

## 2019-12-14 DIAGNOSIS — K219 Gastro-esophageal reflux disease without esophagitis: Secondary | ICD-10-CM | POA: Diagnosis not present

## 2019-12-14 DIAGNOSIS — I10 Essential (primary) hypertension: Secondary | ICD-10-CM | POA: Diagnosis not present

## 2019-12-14 DIAGNOSIS — Z6824 Body mass index (BMI) 24.0-24.9, adult: Secondary | ICD-10-CM | POA: Diagnosis not present

## 2019-12-14 DIAGNOSIS — Z8349 Family history of other endocrine, nutritional and metabolic diseases: Secondary | ICD-10-CM | POA: Diagnosis not present

## 2019-12-14 DIAGNOSIS — J189 Pneumonia, unspecified organism: Secondary | ICD-10-CM | POA: Diagnosis not present

## 2019-12-14 DIAGNOSIS — I213 ST elevation (STEMI) myocardial infarction of unspecified site: Secondary | ICD-10-CM | POA: Diagnosis not present

## 2019-12-14 DIAGNOSIS — E538 Deficiency of other specified B group vitamins: Secondary | ICD-10-CM | POA: Diagnosis not present

## 2019-12-14 DIAGNOSIS — M9902 Segmental and somatic dysfunction of thoracic region: Secondary | ICD-10-CM | POA: Diagnosis not present

## 2019-12-14 DIAGNOSIS — Z5181 Encounter for therapeutic drug level monitoring: Secondary | ICD-10-CM | POA: Diagnosis not present

## 2019-12-14 DIAGNOSIS — Z1389 Encounter for screening for other disorder: Secondary | ICD-10-CM | POA: Diagnosis not present

## 2019-12-14 DIAGNOSIS — Z03818 Encounter for observation for suspected exposure to other biological agents ruled out: Secondary | ICD-10-CM | POA: Diagnosis not present

## 2019-12-14 DIAGNOSIS — Z951 Presence of aortocoronary bypass graft: Secondary | ICD-10-CM | POA: Diagnosis not present

## 2019-12-14 DIAGNOSIS — M129 Arthropathy, unspecified: Secondary | ICD-10-CM | POA: Diagnosis not present

## 2019-12-14 DIAGNOSIS — Z8673 Personal history of transient ischemic attack (TIA), and cerebral infarction without residual deficits: Secondary | ICD-10-CM | POA: Diagnosis not present

## 2019-12-14 DIAGNOSIS — F324 Major depressive disorder, single episode, in partial remission: Secondary | ICD-10-CM | POA: Diagnosis not present

## 2019-12-14 DIAGNOSIS — N189 Chronic kidney disease, unspecified: Secondary | ICD-10-CM | POA: Diagnosis not present

## 2019-12-14 DIAGNOSIS — E663 Overweight: Secondary | ICD-10-CM | POA: Diagnosis not present

## 2019-12-14 DIAGNOSIS — E781 Pure hyperglyceridemia: Secondary | ICD-10-CM | POA: Diagnosis not present

## 2019-12-14 DIAGNOSIS — R399 Unspecified symptoms and signs involving the genitourinary system: Secondary | ICD-10-CM | POA: Diagnosis not present

## 2019-12-14 DIAGNOSIS — M9903 Segmental and somatic dysfunction of lumbar region: Secondary | ICD-10-CM | POA: Diagnosis not present

## 2019-12-14 DIAGNOSIS — Z1159 Encounter for screening for other viral diseases: Secondary | ICD-10-CM | POA: Diagnosis not present

## 2019-12-14 DIAGNOSIS — D539 Nutritional anemia, unspecified: Secondary | ICD-10-CM | POA: Diagnosis not present

## 2019-12-14 DIAGNOSIS — R635 Abnormal weight gain: Secondary | ICD-10-CM | POA: Diagnosis not present

## 2019-12-14 DIAGNOSIS — M955 Acquired deformity of pelvis: Secondary | ICD-10-CM | POA: Diagnosis not present

## 2019-12-14 DIAGNOSIS — M064 Inflammatory polyarthropathy: Secondary | ICD-10-CM | POA: Diagnosis not present

## 2019-12-14 DIAGNOSIS — E039 Hypothyroidism, unspecified: Secondary | ICD-10-CM | POA: Diagnosis not present

## 2019-12-14 DIAGNOSIS — I480 Paroxysmal atrial fibrillation: Secondary | ICD-10-CM | POA: Diagnosis not present

## 2019-12-14 DIAGNOSIS — M810 Age-related osteoporosis without current pathological fracture: Secondary | ICD-10-CM | POA: Diagnosis not present

## 2019-12-14 DIAGNOSIS — K5190 Ulcerative colitis, unspecified, without complications: Secondary | ICD-10-CM | POA: Diagnosis not present

## 2019-12-14 DIAGNOSIS — M9901 Segmental and somatic dysfunction of cervical region: Secondary | ICD-10-CM | POA: Diagnosis not present

## 2019-12-14 DIAGNOSIS — R87610 Atypical squamous cells of undetermined significance on cytologic smear of cervix (ASC-US): Secondary | ICD-10-CM | POA: Diagnosis not present

## 2019-12-14 DIAGNOSIS — I2510 Atherosclerotic heart disease of native coronary artery without angina pectoris: Secondary | ICD-10-CM | POA: Diagnosis not present

## 2019-12-14 DIAGNOSIS — R600 Localized edema: Secondary | ICD-10-CM | POA: Diagnosis not present

## 2019-12-14 DIAGNOSIS — R799 Abnormal finding of blood chemistry, unspecified: Secondary | ICD-10-CM | POA: Diagnosis not present

## 2019-12-14 DIAGNOSIS — R7303 Prediabetes: Secondary | ICD-10-CM | POA: Diagnosis not present

## 2019-12-14 DIAGNOSIS — R079 Chest pain, unspecified: Secondary | ICD-10-CM | POA: Diagnosis not present

## 2019-12-14 DIAGNOSIS — N183 Chronic kidney disease, stage 3 unspecified: Secondary | ICD-10-CM | POA: Diagnosis not present

## 2019-12-14 DIAGNOSIS — R519 Headache, unspecified: Secondary | ICD-10-CM | POA: Diagnosis not present

## 2019-12-14 DIAGNOSIS — R4189 Other symptoms and signs involving cognitive functions and awareness: Secondary | ICD-10-CM | POA: Diagnosis not present

## 2019-12-14 DIAGNOSIS — D519 Vitamin B12 deficiency anemia, unspecified: Secondary | ICD-10-CM | POA: Diagnosis not present

## 2019-12-14 DIAGNOSIS — D649 Anemia, unspecified: Secondary | ICD-10-CM | POA: Diagnosis not present

## 2019-12-14 DIAGNOSIS — K2970 Gastritis, unspecified, without bleeding: Secondary | ICD-10-CM | POA: Diagnosis not present

## 2019-12-14 DIAGNOSIS — E782 Mixed hyperlipidemia: Secondary | ICD-10-CM | POA: Diagnosis not present

## 2019-12-14 DIAGNOSIS — E1142 Type 2 diabetes mellitus with diabetic polyneuropathy: Secondary | ICD-10-CM | POA: Diagnosis not present

## 2019-12-14 DIAGNOSIS — I252 Old myocardial infarction: Secondary | ICD-10-CM | POA: Diagnosis not present

## 2019-12-14 DIAGNOSIS — E7800 Pure hypercholesterolemia, unspecified: Secondary | ICD-10-CM | POA: Diagnosis not present

## 2019-12-14 DIAGNOSIS — N1832 Chronic kidney disease, stage 3b: Secondary | ICD-10-CM | POA: Diagnosis not present

## 2019-12-14 DIAGNOSIS — E519 Thiamine deficiency, unspecified: Secondary | ICD-10-CM | POA: Diagnosis not present

## 2019-12-14 DIAGNOSIS — N898 Other specified noninflammatory disorders of vagina: Secondary | ICD-10-CM | POA: Diagnosis not present

## 2019-12-14 DIAGNOSIS — E1121 Type 2 diabetes mellitus with diabetic nephropathy: Secondary | ICD-10-CM | POA: Diagnosis not present

## 2019-12-14 DIAGNOSIS — D62 Acute posthemorrhagic anemia: Secondary | ICD-10-CM | POA: Diagnosis not present

## 2019-12-14 DIAGNOSIS — C61 Malignant neoplasm of prostate: Secondary | ICD-10-CM | POA: Diagnosis not present

## 2019-12-14 DIAGNOSIS — D631 Anemia in chronic kidney disease: Secondary | ICD-10-CM | POA: Diagnosis not present

## 2019-12-14 DIAGNOSIS — C50911 Malignant neoplasm of unspecified site of right female breast: Secondary | ICD-10-CM | POA: Diagnosis not present

## 2019-12-14 DIAGNOSIS — I5033 Acute on chronic diastolic (congestive) heart failure: Secondary | ICD-10-CM | POA: Diagnosis not present

## 2019-12-14 DIAGNOSIS — I110 Hypertensive heart disease with heart failure: Secondary | ICD-10-CM | POA: Diagnosis not present

## 2019-12-14 DIAGNOSIS — R319 Hematuria, unspecified: Secondary | ICD-10-CM | POA: Diagnosis not present

## 2019-12-14 DIAGNOSIS — M109 Gout, unspecified: Secondary | ICD-10-CM | POA: Diagnosis not present

## 2019-12-14 DIAGNOSIS — R3915 Urgency of urination: Secondary | ICD-10-CM | POA: Diagnosis not present

## 2019-12-14 DIAGNOSIS — R7301 Impaired fasting glucose: Secondary | ICD-10-CM | POA: Diagnosis not present

## 2019-12-14 DIAGNOSIS — M5417 Radiculopathy, lumbosacral region: Secondary | ICD-10-CM | POA: Diagnosis not present

## 2019-12-14 DIAGNOSIS — I499 Cardiac arrhythmia, unspecified: Secondary | ICD-10-CM | POA: Diagnosis not present

## 2019-12-14 DIAGNOSIS — I255 Ischemic cardiomyopathy: Secondary | ICD-10-CM | POA: Diagnosis not present

## 2019-12-14 DIAGNOSIS — Z79899 Other long term (current) drug therapy: Secondary | ICD-10-CM | POA: Diagnosis not present

## 2019-12-14 DIAGNOSIS — I130 Hypertensive heart and chronic kidney disease with heart failure and stage 1 through stage 4 chronic kidney disease, or unspecified chronic kidney disease: Secondary | ICD-10-CM | POA: Diagnosis not present

## 2019-12-14 DIAGNOSIS — I959 Hypotension, unspecified: Secondary | ICD-10-CM | POA: Diagnosis not present

## 2019-12-14 DIAGNOSIS — Z131 Encounter for screening for diabetes mellitus: Secondary | ICD-10-CM | POA: Diagnosis not present

## 2019-12-14 DIAGNOSIS — F332 Major depressive disorder, recurrent severe without psychotic features: Secondary | ICD-10-CM | POA: Diagnosis not present

## 2019-12-14 DIAGNOSIS — Z808 Family history of malignant neoplasm of other organs or systems: Secondary | ICD-10-CM | POA: Diagnosis not present

## 2019-12-14 DIAGNOSIS — I5022 Chronic systolic (congestive) heart failure: Secondary | ICD-10-CM | POA: Diagnosis not present

## 2019-12-14 DIAGNOSIS — E785 Hyperlipidemia, unspecified: Secondary | ICD-10-CM | POA: Diagnosis not present

## 2019-12-14 DIAGNOSIS — G40909 Epilepsy, unspecified, not intractable, without status epilepticus: Secondary | ICD-10-CM | POA: Diagnosis not present

## 2019-12-14 DIAGNOSIS — R2681 Unsteadiness on feet: Secondary | ICD-10-CM | POA: Diagnosis not present

## 2019-12-14 DIAGNOSIS — Z9229 Personal history of other drug therapy: Secondary | ICD-10-CM | POA: Diagnosis not present

## 2019-12-14 DIAGNOSIS — M316 Other giant cell arteritis: Secondary | ICD-10-CM | POA: Diagnosis not present

## 2019-12-14 DIAGNOSIS — M9905 Segmental and somatic dysfunction of pelvic region: Secondary | ICD-10-CM | POA: Diagnosis not present

## 2019-12-14 DIAGNOSIS — Z20828 Contact with and (suspected) exposure to other viral communicable diseases: Secondary | ICD-10-CM | POA: Diagnosis not present

## 2019-12-14 DIAGNOSIS — I4892 Unspecified atrial flutter: Secondary | ICD-10-CM | POA: Diagnosis not present

## 2019-12-14 DIAGNOSIS — M531 Cervicobrachial syndrome: Secondary | ICD-10-CM | POA: Diagnosis not present

## 2019-12-14 DIAGNOSIS — D509 Iron deficiency anemia, unspecified: Secondary | ICD-10-CM | POA: Diagnosis not present

## 2019-12-14 DIAGNOSIS — Z01419 Encounter for gynecological examination (general) (routine) without abnormal findings: Secondary | ICD-10-CM | POA: Diagnosis not present

## 2019-12-14 DIAGNOSIS — Z1152 Encounter for screening for COVID-19: Secondary | ICD-10-CM | POA: Diagnosis not present

## 2019-12-14 DIAGNOSIS — Z7982 Long term (current) use of aspirin: Secondary | ICD-10-CM | POA: Diagnosis not present

## 2019-12-14 DIAGNOSIS — N3090 Cystitis, unspecified without hematuria: Secondary | ICD-10-CM | POA: Diagnosis not present

## 2019-12-14 DIAGNOSIS — Z20822 Contact with and (suspected) exposure to covid-19: Secondary | ICD-10-CM | POA: Diagnosis not present

## 2019-12-14 DIAGNOSIS — M6281 Muscle weakness (generalized): Secondary | ICD-10-CM | POA: Diagnosis not present

## 2019-12-14 DIAGNOSIS — F419 Anxiety disorder, unspecified: Secondary | ICD-10-CM | POA: Diagnosis not present

## 2019-12-14 DIAGNOSIS — J449 Chronic obstructive pulmonary disease, unspecified: Secondary | ICD-10-CM | POA: Diagnosis not present

## 2019-12-14 DIAGNOSIS — Z794 Long term (current) use of insulin: Secondary | ICD-10-CM | POA: Diagnosis not present

## 2019-12-14 DIAGNOSIS — M0579 Rheumatoid arthritis with rheumatoid factor of multiple sites without organ or systems involvement: Secondary | ICD-10-CM | POA: Diagnosis not present

## 2019-12-14 DIAGNOSIS — G35 Multiple sclerosis: Secondary | ICD-10-CM | POA: Diagnosis not present

## 2019-12-14 DIAGNOSIS — E119 Type 2 diabetes mellitus without complications: Secondary | ICD-10-CM | POA: Diagnosis not present

## 2019-12-14 DIAGNOSIS — Z803 Family history of malignant neoplasm of breast: Secondary | ICD-10-CM | POA: Diagnosis not present

## 2019-12-14 DIAGNOSIS — Z8711 Personal history of peptic ulcer disease: Secondary | ICD-10-CM | POA: Diagnosis not present

## 2019-12-14 DIAGNOSIS — E875 Hyperkalemia: Secondary | ICD-10-CM | POA: Diagnosis not present

## 2019-12-14 DIAGNOSIS — N2581 Secondary hyperparathyroidism of renal origin: Secondary | ICD-10-CM | POA: Diagnosis not present

## 2019-12-14 DIAGNOSIS — M9904 Segmental and somatic dysfunction of sacral region: Secondary | ICD-10-CM | POA: Diagnosis not present

## 2019-12-14 DIAGNOSIS — Z1329 Encounter for screening for other suspected endocrine disorder: Secondary | ICD-10-CM | POA: Diagnosis not present

## 2019-12-14 DIAGNOSIS — R0789 Other chest pain: Secondary | ICD-10-CM | POA: Diagnosis not present

## 2019-12-14 DIAGNOSIS — N390 Urinary tract infection, site not specified: Secondary | ICD-10-CM | POA: Diagnosis not present

## 2019-12-14 DIAGNOSIS — E1165 Type 2 diabetes mellitus with hyperglycemia: Secondary | ICD-10-CM | POA: Diagnosis not present

## 2019-12-14 DIAGNOSIS — R209 Unspecified disturbances of skin sensation: Secondary | ICD-10-CM | POA: Diagnosis not present

## 2019-12-14 DIAGNOSIS — E1129 Type 2 diabetes mellitus with other diabetic kidney complication: Secondary | ICD-10-CM | POA: Diagnosis not present

## 2019-12-14 DIAGNOSIS — M80851A Other osteoporosis with current pathological fracture, right femur, initial encounter for fracture: Secondary | ICD-10-CM | POA: Diagnosis not present

## 2019-12-14 DIAGNOSIS — R32 Unspecified urinary incontinence: Secondary | ICD-10-CM | POA: Diagnosis not present

## 2019-12-14 DIAGNOSIS — R52 Pain, unspecified: Secondary | ICD-10-CM | POA: Diagnosis not present

## 2019-12-14 DIAGNOSIS — R809 Proteinuria, unspecified: Secondary | ICD-10-CM | POA: Diagnosis not present

## 2019-12-14 DIAGNOSIS — E118 Type 2 diabetes mellitus with unspecified complications: Secondary | ICD-10-CM | POA: Diagnosis not present

## 2019-12-14 DIAGNOSIS — R197 Diarrhea, unspecified: Secondary | ICD-10-CM | POA: Diagnosis not present

## 2019-12-14 DIAGNOSIS — E871 Hypo-osmolality and hyponatremia: Secondary | ICD-10-CM | POA: Diagnosis not present

## 2019-12-14 DIAGNOSIS — E1159 Type 2 diabetes mellitus with other circulatory complications: Secondary | ICD-10-CM | POA: Diagnosis not present

## 2019-12-14 DIAGNOSIS — M25561 Pain in right knee: Secondary | ICD-10-CM | POA: Diagnosis not present

## 2019-12-14 DIAGNOSIS — Z9181 History of falling: Secondary | ICD-10-CM | POA: Diagnosis not present

## 2019-12-14 DIAGNOSIS — L02414 Cutaneous abscess of left upper limb: Secondary | ICD-10-CM | POA: Diagnosis not present

## 2019-12-14 DIAGNOSIS — R300 Dysuria: Secondary | ICD-10-CM | POA: Diagnosis not present

## 2019-12-14 DIAGNOSIS — M545 Low back pain: Secondary | ICD-10-CM | POA: Diagnosis not present

## 2019-12-14 DIAGNOSIS — F0390 Unspecified dementia without behavioral disturbance: Secondary | ICD-10-CM | POA: Diagnosis not present

## 2019-12-14 DIAGNOSIS — G4733 Obstructive sleep apnea (adult) (pediatric): Secondary | ICD-10-CM | POA: Diagnosis not present

## 2019-12-14 DIAGNOSIS — C73 Malignant neoplasm of thyroid gland: Secondary | ICD-10-CM | POA: Diagnosis not present

## 2019-12-14 DIAGNOSIS — N182 Chronic kidney disease, stage 2 (mild): Secondary | ICD-10-CM | POA: Diagnosis not present

## 2019-12-14 DIAGNOSIS — K625 Hemorrhage of anus and rectum: Secondary | ICD-10-CM | POA: Diagnosis not present

## 2019-12-14 DIAGNOSIS — E559 Vitamin D deficiency, unspecified: Secondary | ICD-10-CM | POA: Diagnosis not present

## 2019-12-14 DIAGNOSIS — R259 Unspecified abnormal involuntary movements: Secondary | ICD-10-CM | POA: Diagnosis not present

## 2019-12-14 DIAGNOSIS — E1140 Type 2 diabetes mellitus with diabetic neuropathy, unspecified: Secondary | ICD-10-CM | POA: Diagnosis not present

## 2019-12-14 DIAGNOSIS — Z7901 Long term (current) use of anticoagulants: Secondary | ICD-10-CM | POA: Diagnosis not present

## 2019-12-14 DIAGNOSIS — M5442 Lumbago with sciatica, left side: Secondary | ICD-10-CM | POA: Diagnosis not present

## 2019-12-14 DIAGNOSIS — Z8249 Family history of ischemic heart disease and other diseases of the circulatory system: Secondary | ICD-10-CM | POA: Diagnosis not present

## 2019-12-14 DIAGNOSIS — I6381 Other cerebral infarction due to occlusion or stenosis of small artery: Secondary | ICD-10-CM | POA: Diagnosis not present

## 2019-12-14 DIAGNOSIS — R82998 Other abnormal findings in urine: Secondary | ICD-10-CM | POA: Diagnosis not present

## 2019-12-14 DIAGNOSIS — W19XXXA Unspecified fall, initial encounter: Secondary | ICD-10-CM | POA: Diagnosis not present

## 2019-12-14 DIAGNOSIS — Z136 Encounter for screening for cardiovascular disorders: Secondary | ICD-10-CM | POA: Diagnosis not present

## 2019-12-14 DIAGNOSIS — R972 Elevated prostate specific antigen [PSA]: Secondary | ICD-10-CM | POA: Diagnosis not present

## 2019-12-14 DIAGNOSIS — Z833 Family history of diabetes mellitus: Secondary | ICD-10-CM | POA: Diagnosis not present

## 2019-12-14 DIAGNOSIS — R05 Cough: Secondary | ICD-10-CM | POA: Diagnosis not present

## 2019-12-14 DIAGNOSIS — Z13220 Encounter for screening for lipoid disorders: Secondary | ICD-10-CM | POA: Diagnosis not present

## 2019-12-14 DIAGNOSIS — I639 Cerebral infarction, unspecified: Secondary | ICD-10-CM | POA: Diagnosis not present

## 2019-12-14 DIAGNOSIS — Z87891 Personal history of nicotine dependence: Secondary | ICD-10-CM | POA: Diagnosis not present

## 2019-12-14 DIAGNOSIS — S066X9A Traumatic subarachnoid hemorrhage with loss of consciousness of unspecified duration, initial encounter: Secondary | ICD-10-CM | POA: Diagnosis not present

## 2019-12-14 DIAGNOSIS — J988 Other specified respiratory disorders: Secondary | ICD-10-CM | POA: Diagnosis not present

## 2019-12-14 DIAGNOSIS — N200 Calculus of kidney: Secondary | ICD-10-CM | POA: Diagnosis not present

## 2019-12-14 DIAGNOSIS — R5383 Other fatigue: Secondary | ICD-10-CM | POA: Diagnosis not present

## 2019-12-14 DIAGNOSIS — R1013 Epigastric pain: Secondary | ICD-10-CM | POA: Diagnosis not present

## 2019-12-14 DIAGNOSIS — M8589 Other specified disorders of bone density and structure, multiple sites: Secondary | ICD-10-CM | POA: Diagnosis not present

## 2019-12-14 DIAGNOSIS — Z113 Encounter for screening for infections with a predominantly sexual mode of transmission: Secondary | ICD-10-CM | POA: Diagnosis not present

## 2019-12-15 DIAGNOSIS — M0609 Rheumatoid arthritis without rheumatoid factor, multiple sites: Secondary | ICD-10-CM | POA: Diagnosis not present

## 2019-12-15 DIAGNOSIS — Z87898 Personal history of other specified conditions: Secondary | ICD-10-CM | POA: Diagnosis not present

## 2019-12-15 DIAGNOSIS — Z6832 Body mass index (BMI) 32.0-32.9, adult: Secondary | ICD-10-CM | POA: Diagnosis not present

## 2019-12-15 DIAGNOSIS — H15831 Staphyloma posticum, right eye: Secondary | ICD-10-CM | POA: Diagnosis not present

## 2019-12-15 DIAGNOSIS — E119 Type 2 diabetes mellitus without complications: Secondary | ICD-10-CM | POA: Diagnosis not present

## 2019-12-15 DIAGNOSIS — Z1389 Encounter for screening for other disorder: Secondary | ICD-10-CM | POA: Diagnosis not present

## 2019-12-15 DIAGNOSIS — Z87891 Personal history of nicotine dependence: Secondary | ICD-10-CM | POA: Diagnosis not present

## 2019-12-15 DIAGNOSIS — M1712 Unilateral primary osteoarthritis, left knee: Secondary | ICD-10-CM | POA: Diagnosis not present

## 2019-12-15 DIAGNOSIS — H35372 Puckering of macula, left eye: Secondary | ICD-10-CM | POA: Diagnosis not present

## 2019-12-15 DIAGNOSIS — Z794 Long term (current) use of insulin: Secondary | ICD-10-CM | POA: Diagnosis not present

## 2019-12-15 DIAGNOSIS — R0602 Shortness of breath: Secondary | ICD-10-CM | POA: Diagnosis not present

## 2019-12-15 DIAGNOSIS — K219 Gastro-esophageal reflux disease without esophagitis: Secondary | ICD-10-CM | POA: Diagnosis not present

## 2019-12-15 DIAGNOSIS — R972 Elevated prostate specific antigen [PSA]: Secondary | ICD-10-CM | POA: Diagnosis not present

## 2019-12-15 DIAGNOSIS — N529 Male erectile dysfunction, unspecified: Secondary | ICD-10-CM | POA: Diagnosis not present

## 2019-12-15 DIAGNOSIS — Z0001 Encounter for general adult medical examination with abnormal findings: Secondary | ICD-10-CM | POA: Diagnosis not present

## 2019-12-15 DIAGNOSIS — E890 Postprocedural hypothyroidism: Secondary | ICD-10-CM | POA: Diagnosis not present

## 2019-12-15 DIAGNOSIS — N1831 Chronic kidney disease, stage 3a: Secondary | ICD-10-CM | POA: Diagnosis not present

## 2019-12-15 DIAGNOSIS — E291 Testicular hypofunction: Secondary | ICD-10-CM | POA: Diagnosis not present

## 2019-12-15 DIAGNOSIS — Z9189 Other specified personal risk factors, not elsewhere classified: Secondary | ICD-10-CM | POA: Diagnosis not present

## 2019-12-15 DIAGNOSIS — Z961 Presence of intraocular lens: Secondary | ICD-10-CM | POA: Diagnosis not present

## 2019-12-15 DIAGNOSIS — E6609 Other obesity due to excess calories: Secondary | ICD-10-CM | POA: Diagnosis not present

## 2019-12-15 DIAGNOSIS — R079 Chest pain, unspecified: Secondary | ICD-10-CM | POA: Diagnosis not present

## 2019-12-15 DIAGNOSIS — H04123 Dry eye syndrome of bilateral lacrimal glands: Secondary | ICD-10-CM | POA: Diagnosis not present

## 2019-12-15 DIAGNOSIS — H35371 Puckering of macula, right eye: Secondary | ICD-10-CM | POA: Diagnosis not present

## 2019-12-15 DIAGNOSIS — R569 Unspecified convulsions: Secondary | ICD-10-CM | POA: Diagnosis not present

## 2019-12-15 DIAGNOSIS — E785 Hyperlipidemia, unspecified: Secondary | ICD-10-CM | POA: Diagnosis not present

## 2019-12-15 DIAGNOSIS — Z79891 Long term (current) use of opiate analgesic: Secondary | ICD-10-CM | POA: Diagnosis not present

## 2019-12-15 DIAGNOSIS — Q165 Congenital malformation of inner ear: Secondary | ICD-10-CM | POA: Diagnosis not present

## 2019-12-15 DIAGNOSIS — E7841 Elevated Lipoprotein(a): Secondary | ICD-10-CM | POA: Diagnosis not present

## 2019-12-15 DIAGNOSIS — I341 Nonrheumatic mitral (valve) prolapse: Secondary | ICD-10-CM | POA: Diagnosis not present

## 2019-12-15 DIAGNOSIS — H353112 Nonexudative age-related macular degeneration, right eye, intermediate dry stage: Secondary | ICD-10-CM | POA: Diagnosis not present

## 2019-12-15 DIAGNOSIS — H4421 Degenerative myopia, right eye: Secondary | ICD-10-CM | POA: Diagnosis not present

## 2019-12-15 DIAGNOSIS — R0789 Other chest pain: Secondary | ICD-10-CM | POA: Diagnosis not present

## 2019-12-15 DIAGNOSIS — Z950 Presence of cardiac pacemaker: Secondary | ICD-10-CM | POA: Diagnosis not present

## 2019-12-15 DIAGNOSIS — R300 Dysuria: Secondary | ICD-10-CM | POA: Diagnosis not present

## 2019-12-15 DIAGNOSIS — F1120 Opioid dependence, uncomplicated: Secondary | ICD-10-CM | POA: Diagnosis not present

## 2019-12-15 DIAGNOSIS — M62838 Other muscle spasm: Secondary | ICD-10-CM | POA: Diagnosis not present

## 2019-12-15 DIAGNOSIS — I25110 Atherosclerotic heart disease of native coronary artery with unstable angina pectoris: Secondary | ICD-10-CM | POA: Diagnosis not present

## 2019-12-15 DIAGNOSIS — Z5181 Encounter for therapeutic drug level monitoring: Secondary | ICD-10-CM | POA: Diagnosis not present

## 2019-12-15 DIAGNOSIS — R948 Abnormal results of function studies of other organs and systems: Secondary | ICD-10-CM | POA: Diagnosis not present

## 2019-12-15 DIAGNOSIS — M1711 Unilateral primary osteoarthritis, right knee: Secondary | ICD-10-CM | POA: Diagnosis not present

## 2019-12-15 DIAGNOSIS — E038 Other specified hypothyroidism: Secondary | ICD-10-CM | POA: Diagnosis not present

## 2019-12-15 DIAGNOSIS — R5381 Other malaise: Secondary | ICD-10-CM | POA: Diagnosis not present

## 2019-12-15 DIAGNOSIS — L570 Actinic keratosis: Secondary | ICD-10-CM | POA: Diagnosis not present

## 2019-12-15 DIAGNOSIS — R509 Fever, unspecified: Secondary | ICD-10-CM | POA: Diagnosis not present

## 2019-12-15 DIAGNOSIS — Z7689 Persons encountering health services in other specified circumstances: Secondary | ICD-10-CM | POA: Diagnosis not present

## 2019-12-15 DIAGNOSIS — I10 Essential (primary) hypertension: Secondary | ICD-10-CM | POA: Diagnosis not present

## 2019-12-15 DIAGNOSIS — J449 Chronic obstructive pulmonary disease, unspecified: Secondary | ICD-10-CM | POA: Diagnosis not present

## 2019-12-15 DIAGNOSIS — R195 Other fecal abnormalities: Secondary | ICD-10-CM | POA: Diagnosis not present

## 2019-12-15 DIAGNOSIS — R399 Unspecified symptoms and signs involving the genitourinary system: Secondary | ICD-10-CM | POA: Diagnosis not present

## 2019-12-15 DIAGNOSIS — R748 Abnormal levels of other serum enzymes: Secondary | ICD-10-CM | POA: Diagnosis not present

## 2019-12-15 DIAGNOSIS — R069 Unspecified abnormalities of breathing: Secondary | ICD-10-CM | POA: Diagnosis not present

## 2019-12-15 DIAGNOSIS — Q825 Congenital non-neoplastic nevus: Secondary | ICD-10-CM | POA: Diagnosis not present

## 2019-12-15 DIAGNOSIS — D529 Folate deficiency anemia, unspecified: Secondary | ICD-10-CM | POA: Diagnosis not present

## 2019-12-15 DIAGNOSIS — E7800 Pure hypercholesterolemia, unspecified: Secondary | ICD-10-CM | POA: Diagnosis not present

## 2019-12-15 DIAGNOSIS — Z20822 Contact with and (suspected) exposure to covid-19: Secondary | ICD-10-CM | POA: Diagnosis not present

## 2019-12-15 DIAGNOSIS — M9904 Segmental and somatic dysfunction of sacral region: Secondary | ICD-10-CM | POA: Diagnosis not present

## 2019-12-15 DIAGNOSIS — D649 Anemia, unspecified: Secondary | ICD-10-CM | POA: Diagnosis not present

## 2019-12-15 DIAGNOSIS — I493 Ventricular premature depolarization: Secondary | ICD-10-CM | POA: Diagnosis not present

## 2019-12-15 DIAGNOSIS — M47817 Spondylosis without myelopathy or radiculopathy, lumbosacral region: Secondary | ICD-10-CM | POA: Diagnosis not present

## 2019-12-15 DIAGNOSIS — E782 Mixed hyperlipidemia: Secondary | ICD-10-CM | POA: Diagnosis not present

## 2019-12-15 DIAGNOSIS — E1151 Type 2 diabetes mellitus with diabetic peripheral angiopathy without gangrene: Secondary | ICD-10-CM | POA: Diagnosis not present

## 2019-12-15 DIAGNOSIS — D519 Vitamin B12 deficiency anemia, unspecified: Secondary | ICD-10-CM | POA: Diagnosis not present

## 2019-12-15 DIAGNOSIS — M791 Myalgia, unspecified site: Secondary | ICD-10-CM | POA: Diagnosis not present

## 2019-12-15 DIAGNOSIS — R0689 Other abnormalities of breathing: Secondary | ICD-10-CM | POA: Diagnosis not present

## 2019-12-15 DIAGNOSIS — N184 Chronic kidney disease, stage 4 (severe): Secondary | ICD-10-CM | POA: Diagnosis not present

## 2019-12-15 DIAGNOSIS — E118 Type 2 diabetes mellitus with unspecified complications: Secondary | ICD-10-CM | POA: Diagnosis not present

## 2019-12-15 DIAGNOSIS — H35363 Drusen (degenerative) of macula, bilateral: Secondary | ICD-10-CM | POA: Diagnosis not present

## 2019-12-15 DIAGNOSIS — M9903 Segmental and somatic dysfunction of lumbar region: Secondary | ICD-10-CM | POA: Diagnosis not present

## 2019-12-15 DIAGNOSIS — K8020 Calculus of gallbladder without cholecystitis without obstruction: Secondary | ICD-10-CM | POA: Diagnosis not present

## 2019-12-15 DIAGNOSIS — Z20828 Contact with and (suspected) exposure to other viral communicable diseases: Secondary | ICD-10-CM | POA: Diagnosis not present

## 2019-12-15 DIAGNOSIS — R82998 Other abnormal findings in urine: Secondary | ICD-10-CM | POA: Diagnosis not present

## 2019-12-15 DIAGNOSIS — E1169 Type 2 diabetes mellitus with other specified complication: Secondary | ICD-10-CM | POA: Diagnosis not present

## 2019-12-15 DIAGNOSIS — M542 Cervicalgia: Secondary | ICD-10-CM | POA: Diagnosis not present

## 2019-12-15 DIAGNOSIS — I2510 Atherosclerotic heart disease of native coronary artery without angina pectoris: Secondary | ICD-10-CM | POA: Diagnosis not present

## 2019-12-15 DIAGNOSIS — G43009 Migraine without aura, not intractable, without status migrainosus: Secondary | ICD-10-CM | POA: Diagnosis not present

## 2019-12-15 DIAGNOSIS — Z6829 Body mass index (BMI) 29.0-29.9, adult: Secondary | ICD-10-CM | POA: Diagnosis not present

## 2019-12-15 DIAGNOSIS — E0590 Thyrotoxicosis, unspecified without thyrotoxic crisis or storm: Secondary | ICD-10-CM | POA: Diagnosis not present

## 2019-12-15 DIAGNOSIS — D0422 Carcinoma in situ of skin of left ear and external auricular canal: Secondary | ICD-10-CM | POA: Diagnosis not present

## 2019-12-15 DIAGNOSIS — N1330 Unspecified hydronephrosis: Secondary | ICD-10-CM | POA: Diagnosis not present

## 2019-12-15 DIAGNOSIS — I4891 Unspecified atrial fibrillation: Secondary | ICD-10-CM | POA: Diagnosis not present

## 2019-12-15 DIAGNOSIS — D225 Melanocytic nevi of trunk: Secondary | ICD-10-CM | POA: Diagnosis not present

## 2019-12-15 DIAGNOSIS — K2970 Gastritis, unspecified, without bleeding: Secondary | ICD-10-CM | POA: Diagnosis not present

## 2019-12-15 DIAGNOSIS — R0989 Other specified symptoms and signs involving the circulatory and respiratory systems: Secondary | ICD-10-CM | POA: Diagnosis not present

## 2019-12-15 DIAGNOSIS — Z1159 Encounter for screening for other viral diseases: Secondary | ICD-10-CM | POA: Diagnosis not present

## 2019-12-15 DIAGNOSIS — Z886 Allergy status to analgesic agent status: Secondary | ICD-10-CM | POA: Diagnosis not present

## 2019-12-15 DIAGNOSIS — Z01419 Encounter for gynecological examination (general) (routine) without abnormal findings: Secondary | ICD-10-CM | POA: Diagnosis not present

## 2019-12-15 DIAGNOSIS — M5136 Other intervertebral disc degeneration, lumbar region: Secondary | ICD-10-CM | POA: Diagnosis not present

## 2019-12-15 DIAGNOSIS — I129 Hypertensive chronic kidney disease with stage 1 through stage 4 chronic kidney disease, or unspecified chronic kidney disease: Secondary | ICD-10-CM | POA: Diagnosis not present

## 2019-12-15 DIAGNOSIS — H30032 Focal chorioretinal inflammation, peripheral, left eye: Secondary | ICD-10-CM | POA: Diagnosis not present

## 2019-12-15 DIAGNOSIS — Z6826 Body mass index (BMI) 26.0-26.9, adult: Secondary | ICD-10-CM | POA: Diagnosis not present

## 2019-12-15 DIAGNOSIS — B078 Other viral warts: Secondary | ICD-10-CM | POA: Diagnosis not present

## 2019-12-15 DIAGNOSIS — F3173 Bipolar disorder, in partial remission, most recent episode manic: Secondary | ICD-10-CM | POA: Diagnosis not present

## 2019-12-15 DIAGNOSIS — Z85850 Personal history of malignant neoplasm of thyroid: Secondary | ICD-10-CM | POA: Diagnosis not present

## 2019-12-15 DIAGNOSIS — D2262 Melanocytic nevi of left upper limb, including shoulder: Secondary | ICD-10-CM | POA: Diagnosis not present

## 2019-12-15 DIAGNOSIS — Z8551 Personal history of malignant neoplasm of bladder: Secondary | ICD-10-CM | POA: Diagnosis not present

## 2019-12-15 DIAGNOSIS — Z0000 Encounter for general adult medical examination without abnormal findings: Secondary | ICD-10-CM | POA: Diagnosis not present

## 2019-12-15 DIAGNOSIS — D518 Other vitamin B12 deficiency anemias: Secondary | ICD-10-CM | POA: Diagnosis not present

## 2019-12-15 DIAGNOSIS — R05 Cough: Secondary | ICD-10-CM | POA: Diagnosis not present

## 2019-12-15 DIAGNOSIS — M25511 Pain in right shoulder: Secondary | ICD-10-CM | POA: Diagnosis not present

## 2019-12-15 DIAGNOSIS — N390 Urinary tract infection, site not specified: Secondary | ICD-10-CM | POA: Diagnosis not present

## 2019-12-15 DIAGNOSIS — N183 Chronic kidney disease, stage 3 unspecified: Secondary | ICD-10-CM | POA: Diagnosis not present

## 2019-12-15 DIAGNOSIS — E559 Vitamin D deficiency, unspecified: Secondary | ICD-10-CM | POA: Diagnosis not present

## 2019-12-15 DIAGNOSIS — E663 Overweight: Secondary | ICD-10-CM | POA: Diagnosis not present

## 2019-12-15 DIAGNOSIS — D120 Benign neoplasm of cecum: Secondary | ICD-10-CM | POA: Diagnosis not present

## 2019-12-15 DIAGNOSIS — J3489 Other specified disorders of nose and nasal sinuses: Secondary | ICD-10-CM | POA: Diagnosis not present

## 2019-12-15 DIAGNOSIS — E042 Nontoxic multinodular goiter: Secondary | ICD-10-CM | POA: Diagnosis not present

## 2019-12-15 DIAGNOSIS — R001 Bradycardia, unspecified: Secondary | ICD-10-CM | POA: Diagnosis not present

## 2019-12-15 DIAGNOSIS — F1020 Alcohol dependence, uncomplicated: Secondary | ICD-10-CM | POA: Diagnosis not present

## 2019-12-15 DIAGNOSIS — Z125 Encounter for screening for malignant neoplasm of prostate: Secondary | ICD-10-CM | POA: Diagnosis not present

## 2019-12-15 DIAGNOSIS — D3702 Neoplasm of uncertain behavior of tongue: Secondary | ICD-10-CM | POA: Diagnosis not present

## 2019-12-15 DIAGNOSIS — L858 Other specified epidermal thickening: Secondary | ICD-10-CM | POA: Diagnosis not present

## 2019-12-15 DIAGNOSIS — I700 Atherosclerosis of aorta: Secondary | ICD-10-CM | POA: Diagnosis not present

## 2019-12-15 DIAGNOSIS — D485 Neoplasm of uncertain behavior of skin: Secondary | ICD-10-CM | POA: Diagnosis not present

## 2019-12-15 DIAGNOSIS — L918 Other hypertrophic disorders of the skin: Secondary | ICD-10-CM | POA: Diagnosis not present

## 2019-12-15 DIAGNOSIS — L821 Other seborrheic keratosis: Secondary | ICD-10-CM | POA: Diagnosis not present

## 2019-12-15 DIAGNOSIS — I781 Nevus, non-neoplastic: Secondary | ICD-10-CM | POA: Diagnosis not present

## 2019-12-15 DIAGNOSIS — E039 Hypothyroidism, unspecified: Secondary | ICD-10-CM | POA: Diagnosis not present

## 2019-12-15 DIAGNOSIS — I4819 Other persistent atrial fibrillation: Secondary | ICD-10-CM | POA: Diagnosis not present

## 2019-12-15 DIAGNOSIS — N200 Calculus of kidney: Secondary | ICD-10-CM | POA: Diagnosis not present

## 2019-12-15 DIAGNOSIS — R002 Palpitations: Secondary | ICD-10-CM | POA: Diagnosis not present

## 2019-12-15 DIAGNOSIS — G4730 Sleep apnea, unspecified: Secondary | ICD-10-CM | POA: Diagnosis not present

## 2019-12-15 DIAGNOSIS — R8290 Unspecified abnormal findings in urine: Secondary | ICD-10-CM | POA: Diagnosis not present

## 2019-12-15 DIAGNOSIS — N189 Chronic kidney disease, unspecified: Secondary | ICD-10-CM | POA: Diagnosis not present

## 2019-12-15 DIAGNOSIS — M4316 Spondylolisthesis, lumbar region: Secondary | ICD-10-CM | POA: Diagnosis not present

## 2019-12-15 DIAGNOSIS — H353121 Nonexudative age-related macular degeneration, left eye, early dry stage: Secondary | ICD-10-CM | POA: Diagnosis not present

## 2019-12-15 DIAGNOSIS — K5730 Diverticulosis of large intestine without perforation or abscess without bleeding: Secondary | ICD-10-CM | POA: Diagnosis not present

## 2019-12-15 DIAGNOSIS — Z9049 Acquired absence of other specified parts of digestive tract: Secondary | ICD-10-CM | POA: Diagnosis not present

## 2019-12-15 DIAGNOSIS — E049 Nontoxic goiter, unspecified: Secondary | ICD-10-CM | POA: Diagnosis not present

## 2019-12-15 DIAGNOSIS — Z79899 Other long term (current) drug therapy: Secondary | ICD-10-CM | POA: Diagnosis not present

## 2019-12-15 DIAGNOSIS — M0579 Rheumatoid arthritis with rheumatoid factor of multiple sites without organ or systems involvement: Secondary | ICD-10-CM | POA: Diagnosis not present

## 2019-12-15 DIAGNOSIS — R197 Diarrhea, unspecified: Secondary | ICD-10-CM | POA: Diagnosis not present

## 2019-12-15 DIAGNOSIS — N179 Acute kidney failure, unspecified: Secondary | ICD-10-CM | POA: Diagnosis not present

## 2019-12-15 DIAGNOSIS — Z7901 Long term (current) use of anticoagulants: Secondary | ICD-10-CM | POA: Diagnosis not present

## 2019-12-15 DIAGNOSIS — H35453 Secondary pigmentary degeneration, bilateral: Secondary | ICD-10-CM | POA: Diagnosis not present

## 2019-12-15 DIAGNOSIS — R5383 Other fatigue: Secondary | ICD-10-CM | POA: Diagnosis not present

## 2019-12-15 DIAGNOSIS — M1990 Unspecified osteoarthritis, unspecified site: Secondary | ICD-10-CM | POA: Diagnosis not present

## 2019-12-15 DIAGNOSIS — H26223 Cataract secondary to ocular disorders (degenerative) (inflammatory), bilateral: Secondary | ICD-10-CM | POA: Diagnosis not present

## 2019-12-15 DIAGNOSIS — Z1211 Encounter for screening for malignant neoplasm of colon: Secondary | ICD-10-CM | POA: Diagnosis not present

## 2019-12-15 DIAGNOSIS — R9431 Abnormal electrocardiogram [ECG] [EKG]: Secondary | ICD-10-CM | POA: Diagnosis not present

## 2019-12-15 DIAGNOSIS — I480 Paroxysmal atrial fibrillation: Secondary | ICD-10-CM | POA: Diagnosis not present

## 2019-12-15 DIAGNOSIS — Z96612 Presence of left artificial shoulder joint: Secondary | ICD-10-CM | POA: Diagnosis not present

## 2019-12-15 DIAGNOSIS — D128 Benign neoplasm of rectum: Secondary | ICD-10-CM | POA: Diagnosis not present

## 2019-12-15 DIAGNOSIS — R519 Headache, unspecified: Secondary | ICD-10-CM | POA: Diagnosis not present

## 2019-12-15 DIAGNOSIS — E1165 Type 2 diabetes mellitus with hyperglycemia: Secondary | ICD-10-CM | POA: Diagnosis not present

## 2019-12-15 DIAGNOSIS — R739 Hyperglycemia, unspecified: Secondary | ICD-10-CM | POA: Diagnosis not present

## 2019-12-15 DIAGNOSIS — J4520 Mild intermittent asthma, uncomplicated: Secondary | ICD-10-CM | POA: Diagnosis not present

## 2019-12-15 DIAGNOSIS — Z03818 Encounter for observation for suspected exposure to other biological agents ruled out: Secondary | ICD-10-CM | POA: Diagnosis not present

## 2019-12-15 DIAGNOSIS — E1122 Type 2 diabetes mellitus with diabetic chronic kidney disease: Secondary | ICD-10-CM | POA: Diagnosis not present

## 2019-12-15 DIAGNOSIS — R7301 Impaired fasting glucose: Secondary | ICD-10-CM | POA: Diagnosis not present

## 2019-12-15 DIAGNOSIS — M5137 Other intervertebral disc degeneration, lumbosacral region: Secondary | ICD-10-CM | POA: Diagnosis not present

## 2019-12-16 DIAGNOSIS — R7889 Finding of other specified substances, not normally found in blood: Secondary | ICD-10-CM | POA: Diagnosis not present

## 2019-12-16 DIAGNOSIS — I4821 Permanent atrial fibrillation: Secondary | ICD-10-CM | POA: Diagnosis not present

## 2019-12-16 DIAGNOSIS — Z01812 Encounter for preprocedural laboratory examination: Secondary | ICD-10-CM | POA: Diagnosis not present

## 2019-12-16 DIAGNOSIS — Z96651 Presence of right artificial knee joint: Secondary | ICD-10-CM | POA: Diagnosis not present

## 2019-12-16 DIAGNOSIS — E0590 Thyrotoxicosis, unspecified without thyrotoxic crisis or storm: Secondary | ICD-10-CM | POA: Diagnosis not present

## 2019-12-16 DIAGNOSIS — N1832 Chronic kidney disease, stage 3b: Secondary | ICD-10-CM | POA: Diagnosis not present

## 2019-12-16 DIAGNOSIS — I480 Paroxysmal atrial fibrillation: Secondary | ICD-10-CM | POA: Diagnosis not present

## 2019-12-16 DIAGNOSIS — R7302 Impaired glucose tolerance (oral): Secondary | ICD-10-CM | POA: Diagnosis not present

## 2019-12-16 DIAGNOSIS — Z1211 Encounter for screening for malignant neoplasm of colon: Secondary | ICD-10-CM | POA: Diagnosis not present

## 2019-12-16 DIAGNOSIS — F17210 Nicotine dependence, cigarettes, uncomplicated: Secondary | ICD-10-CM | POA: Diagnosis not present

## 2019-12-16 DIAGNOSIS — R05 Cough: Secondary | ICD-10-CM | POA: Diagnosis not present

## 2019-12-16 DIAGNOSIS — M960 Pseudarthrosis after fusion or arthrodesis: Secondary | ICD-10-CM | POA: Diagnosis not present

## 2019-12-16 DIAGNOSIS — E870 Hyperosmolality and hypernatremia: Secondary | ICD-10-CM | POA: Diagnosis not present

## 2019-12-16 DIAGNOSIS — R6881 Early satiety: Secondary | ICD-10-CM | POA: Diagnosis not present

## 2019-12-16 DIAGNOSIS — I4901 Ventricular fibrillation: Secondary | ICD-10-CM | POA: Diagnosis not present

## 2019-12-16 DIAGNOSIS — Z7401 Bed confinement status: Secondary | ICD-10-CM | POA: Diagnosis not present

## 2019-12-16 DIAGNOSIS — M129 Arthropathy, unspecified: Secondary | ICD-10-CM | POA: Diagnosis not present

## 2019-12-16 DIAGNOSIS — N182 Chronic kidney disease, stage 2 (mild): Secondary | ICD-10-CM | POA: Diagnosis not present

## 2019-12-16 DIAGNOSIS — G309 Alzheimer's disease, unspecified: Secondary | ICD-10-CM | POA: Diagnosis not present

## 2019-12-16 DIAGNOSIS — Z6832 Body mass index (BMI) 32.0-32.9, adult: Secondary | ICD-10-CM | POA: Diagnosis not present

## 2019-12-16 DIAGNOSIS — Z794 Long term (current) use of insulin: Secondary | ICD-10-CM | POA: Diagnosis not present

## 2019-12-16 DIAGNOSIS — E519 Thiamine deficiency, unspecified: Secondary | ICD-10-CM | POA: Diagnosis not present

## 2019-12-16 DIAGNOSIS — R7989 Other specified abnormal findings of blood chemistry: Secondary | ICD-10-CM | POA: Diagnosis not present

## 2019-12-16 DIAGNOSIS — R0902 Hypoxemia: Secondary | ICD-10-CM | POA: Diagnosis not present

## 2019-12-16 DIAGNOSIS — I468 Cardiac arrest due to other underlying condition: Secondary | ICD-10-CM | POA: Diagnosis not present

## 2019-12-16 DIAGNOSIS — M9901 Segmental and somatic dysfunction of cervical region: Secondary | ICD-10-CM | POA: Diagnosis not present

## 2019-12-16 DIAGNOSIS — E890 Postprocedural hypothyroidism: Secondary | ICD-10-CM | POA: Diagnosis not present

## 2019-12-16 DIAGNOSIS — R300 Dysuria: Secondary | ICD-10-CM | POA: Diagnosis not present

## 2019-12-16 DIAGNOSIS — R82998 Other abnormal findings in urine: Secondary | ICD-10-CM | POA: Diagnosis not present

## 2019-12-16 DIAGNOSIS — I482 Chronic atrial fibrillation, unspecified: Secondary | ICD-10-CM | POA: Diagnosis not present

## 2019-12-16 DIAGNOSIS — D519 Vitamin B12 deficiency anemia, unspecified: Secondary | ICD-10-CM | POA: Diagnosis not present

## 2019-12-16 DIAGNOSIS — R6883 Chills (without fever): Secondary | ICD-10-CM | POA: Diagnosis not present

## 2019-12-16 DIAGNOSIS — N179 Acute kidney failure, unspecified: Secondary | ICD-10-CM | POA: Diagnosis not present

## 2019-12-16 DIAGNOSIS — Z8041 Family history of malignant neoplasm of ovary: Secondary | ICD-10-CM | POA: Diagnosis not present

## 2019-12-16 DIAGNOSIS — M955 Acquired deformity of pelvis: Secondary | ICD-10-CM | POA: Diagnosis not present

## 2019-12-16 DIAGNOSIS — E872 Acidosis: Secondary | ICD-10-CM | POA: Diagnosis not present

## 2019-12-16 DIAGNOSIS — R402432 Glasgow coma scale score 3-8, at arrival to emergency department: Secondary | ICD-10-CM | POA: Diagnosis not present

## 2019-12-16 DIAGNOSIS — I959 Hypotension, unspecified: Secondary | ICD-10-CM | POA: Diagnosis not present

## 2019-12-16 DIAGNOSIS — D7589 Other specified diseases of blood and blood-forming organs: Secondary | ICD-10-CM | POA: Diagnosis not present

## 2019-12-16 DIAGNOSIS — I2510 Atherosclerotic heart disease of native coronary artery without angina pectoris: Secondary | ICD-10-CM | POA: Diagnosis not present

## 2019-12-16 DIAGNOSIS — M1990 Unspecified osteoarthritis, unspecified site: Secondary | ICD-10-CM | POA: Diagnosis not present

## 2019-12-16 DIAGNOSIS — E538 Deficiency of other specified B group vitamins: Secondary | ICD-10-CM | POA: Diagnosis not present

## 2019-12-16 DIAGNOSIS — N289 Disorder of kidney and ureter, unspecified: Secondary | ICD-10-CM | POA: Diagnosis not present

## 2019-12-16 DIAGNOSIS — E291 Testicular hypofunction: Secondary | ICD-10-CM | POA: Diagnosis not present

## 2019-12-16 DIAGNOSIS — Z823 Family history of stroke: Secondary | ICD-10-CM | POA: Diagnosis not present

## 2019-12-16 DIAGNOSIS — E041 Nontoxic single thyroid nodule: Secondary | ICD-10-CM | POA: Diagnosis not present

## 2019-12-16 DIAGNOSIS — E7800 Pure hypercholesterolemia, unspecified: Secondary | ICD-10-CM | POA: Diagnosis not present

## 2019-12-16 DIAGNOSIS — E559 Vitamin D deficiency, unspecified: Secondary | ICD-10-CM | POA: Diagnosis not present

## 2019-12-16 DIAGNOSIS — E1122 Type 2 diabetes mellitus with diabetic chronic kidney disease: Secondary | ICD-10-CM | POA: Diagnosis not present

## 2019-12-16 DIAGNOSIS — E1169 Type 2 diabetes mellitus with other specified complication: Secondary | ICD-10-CM | POA: Diagnosis not present

## 2019-12-16 DIAGNOSIS — R309 Painful micturition, unspecified: Secondary | ICD-10-CM | POA: Diagnosis not present

## 2019-12-16 DIAGNOSIS — Z6823 Body mass index (BMI) 23.0-23.9, adult: Secondary | ICD-10-CM | POA: Diagnosis not present

## 2019-12-16 DIAGNOSIS — K219 Gastro-esophageal reflux disease without esophagitis: Secondary | ICD-10-CM | POA: Diagnosis not present

## 2019-12-16 DIAGNOSIS — R5383 Other fatigue: Secondary | ICD-10-CM | POA: Diagnosis not present

## 2019-12-16 DIAGNOSIS — Z79899 Other long term (current) drug therapy: Secondary | ICD-10-CM | POA: Diagnosis not present

## 2019-12-16 DIAGNOSIS — R7303 Prediabetes: Secondary | ICD-10-CM | POA: Diagnosis not present

## 2019-12-16 DIAGNOSIS — M545 Low back pain: Secondary | ICD-10-CM | POA: Diagnosis not present

## 2019-12-16 DIAGNOSIS — R768 Other specified abnormal immunological findings in serum: Secondary | ICD-10-CM | POA: Diagnosis not present

## 2019-12-16 DIAGNOSIS — C61 Malignant neoplasm of prostate: Secondary | ICD-10-CM | POA: Diagnosis not present

## 2019-12-16 DIAGNOSIS — T8489XA Other specified complication of internal orthopedic prosthetic devices, implants and grafts, initial encounter: Secondary | ICD-10-CM | POA: Diagnosis not present

## 2019-12-16 DIAGNOSIS — Z1389 Encounter for screening for other disorder: Secondary | ICD-10-CM | POA: Diagnosis not present

## 2019-12-16 DIAGNOSIS — Z1152 Encounter for screening for COVID-19: Secondary | ICD-10-CM | POA: Diagnosis not present

## 2019-12-16 DIAGNOSIS — Z1159 Encounter for screening for other viral diseases: Secondary | ICD-10-CM | POA: Diagnosis not present

## 2019-12-16 DIAGNOSIS — N6082 Other benign mammary dysplasias of left breast: Secondary | ICD-10-CM | POA: Diagnosis not present

## 2019-12-16 DIAGNOSIS — K922 Gastrointestinal hemorrhage, unspecified: Secondary | ICD-10-CM | POA: Diagnosis not present

## 2019-12-16 DIAGNOSIS — C73 Malignant neoplasm of thyroid gland: Secondary | ICD-10-CM | POA: Diagnosis not present

## 2019-12-16 DIAGNOSIS — M531 Cervicobrachial syndrome: Secondary | ICD-10-CM | POA: Diagnosis not present

## 2019-12-16 DIAGNOSIS — Z95810 Presence of automatic (implantable) cardiac defibrillator: Secondary | ICD-10-CM | POA: Diagnosis not present

## 2019-12-16 DIAGNOSIS — E782 Mixed hyperlipidemia: Secondary | ICD-10-CM | POA: Diagnosis not present

## 2019-12-16 DIAGNOSIS — Z125 Encounter for screening for malignant neoplasm of prostate: Secondary | ICD-10-CM | POA: Diagnosis not present

## 2019-12-16 DIAGNOSIS — E063 Autoimmune thyroiditis: Secondary | ICD-10-CM | POA: Diagnosis not present

## 2019-12-16 DIAGNOSIS — I5032 Chronic diastolic (congestive) heart failure: Secondary | ICD-10-CM | POA: Diagnosis not present

## 2019-12-16 DIAGNOSIS — G3184 Mild cognitive impairment, so stated: Secondary | ICD-10-CM | POA: Diagnosis not present

## 2019-12-16 DIAGNOSIS — Z23 Encounter for immunization: Secondary | ICD-10-CM | POA: Diagnosis not present

## 2019-12-16 DIAGNOSIS — I428 Other cardiomyopathies: Secondary | ICD-10-CM | POA: Diagnosis not present

## 2019-12-16 DIAGNOSIS — M109 Gout, unspecified: Secondary | ICD-10-CM | POA: Diagnosis not present

## 2019-12-16 DIAGNOSIS — R413 Other amnesia: Secondary | ICD-10-CM | POA: Diagnosis not present

## 2019-12-16 DIAGNOSIS — M9903 Segmental and somatic dysfunction of lumbar region: Secondary | ICD-10-CM | POA: Diagnosis not present

## 2019-12-16 DIAGNOSIS — E038 Other specified hypothyroidism: Secondary | ICD-10-CM | POA: Diagnosis not present

## 2019-12-16 DIAGNOSIS — L659 Nonscarring hair loss, unspecified: Secondary | ICD-10-CM | POA: Diagnosis not present

## 2019-12-16 DIAGNOSIS — R8290 Unspecified abnormal findings in urine: Secondary | ICD-10-CM | POA: Diagnosis not present

## 2019-12-16 DIAGNOSIS — E11319 Type 2 diabetes mellitus with unspecified diabetic retinopathy without macular edema: Secondary | ICD-10-CM | POA: Diagnosis not present

## 2019-12-16 DIAGNOSIS — E039 Hypothyroidism, unspecified: Secondary | ICD-10-CM | POA: Diagnosis not present

## 2019-12-16 DIAGNOSIS — M9905 Segmental and somatic dysfunction of pelvic region: Secondary | ICD-10-CM | POA: Diagnosis not present

## 2019-12-16 DIAGNOSIS — G4701 Insomnia due to medical condition: Secondary | ICD-10-CM | POA: Diagnosis not present

## 2019-12-16 DIAGNOSIS — Z20822 Contact with and (suspected) exposure to covid-19: Secondary | ICD-10-CM | POA: Diagnosis not present

## 2019-12-16 DIAGNOSIS — E118 Type 2 diabetes mellitus with unspecified complications: Secondary | ICD-10-CM | POA: Diagnosis not present

## 2019-12-16 DIAGNOSIS — U071 COVID-19: Secondary | ICD-10-CM | POA: Diagnosis not present

## 2019-12-16 DIAGNOSIS — E860 Dehydration: Secondary | ICD-10-CM | POA: Diagnosis not present

## 2019-12-16 DIAGNOSIS — Z0000 Encounter for general adult medical examination without abnormal findings: Secondary | ICD-10-CM | POA: Diagnosis not present

## 2019-12-16 DIAGNOSIS — N183 Chronic kidney disease, stage 3 unspecified: Secondary | ICD-10-CM | POA: Diagnosis not present

## 2019-12-16 DIAGNOSIS — Z8619 Personal history of other infectious and parasitic diseases: Secondary | ICD-10-CM | POA: Diagnosis not present

## 2019-12-16 DIAGNOSIS — M810 Age-related osteoporosis without current pathological fracture: Secondary | ICD-10-CM | POA: Diagnosis not present

## 2019-12-16 DIAGNOSIS — R0600 Dyspnea, unspecified: Secondary | ICD-10-CM | POA: Diagnosis not present

## 2019-12-16 DIAGNOSIS — E1165 Type 2 diabetes mellitus with hyperglycemia: Secondary | ICD-10-CM | POA: Diagnosis not present

## 2019-12-16 DIAGNOSIS — M818 Other osteoporosis without current pathological fracture: Secondary | ICD-10-CM | POA: Diagnosis not present

## 2019-12-16 DIAGNOSIS — I129 Hypertensive chronic kidney disease with stage 1 through stage 4 chronic kidney disease, or unspecified chronic kidney disease: Secondary | ICD-10-CM | POA: Diagnosis not present

## 2019-12-16 DIAGNOSIS — R570 Cardiogenic shock: Secondary | ICD-10-CM | POA: Diagnosis not present

## 2019-12-16 DIAGNOSIS — J029 Acute pharyngitis, unspecified: Secondary | ICD-10-CM | POA: Diagnosis not present

## 2019-12-16 DIAGNOSIS — R531 Weakness: Secondary | ICD-10-CM | POA: Diagnosis not present

## 2019-12-16 DIAGNOSIS — R310 Gross hematuria: Secondary | ICD-10-CM | POA: Diagnosis not present

## 2019-12-16 DIAGNOSIS — I495 Sick sinus syndrome: Secondary | ICD-10-CM | POA: Diagnosis not present

## 2019-12-16 DIAGNOSIS — R3129 Other microscopic hematuria: Secondary | ICD-10-CM | POA: Diagnosis not present

## 2019-12-16 DIAGNOSIS — R319 Hematuria, unspecified: Secondary | ICD-10-CM | POA: Diagnosis not present

## 2019-12-16 DIAGNOSIS — I998 Other disorder of circulatory system: Secondary | ICD-10-CM | POA: Diagnosis not present

## 2019-12-16 DIAGNOSIS — N400 Enlarged prostate without lower urinary tract symptoms: Secondary | ICD-10-CM | POA: Diagnosis not present

## 2019-12-16 DIAGNOSIS — M2550 Pain in unspecified joint: Secondary | ICD-10-CM | POA: Diagnosis not present

## 2019-12-16 DIAGNOSIS — R5382 Chronic fatigue, unspecified: Secondary | ICD-10-CM | POA: Diagnosis not present

## 2019-12-16 DIAGNOSIS — Z0001 Encounter for general adult medical examination with abnormal findings: Secondary | ICD-10-CM | POA: Diagnosis not present

## 2019-12-16 DIAGNOSIS — M5442 Lumbago with sciatica, left side: Secondary | ICD-10-CM | POA: Diagnosis not present

## 2019-12-16 DIAGNOSIS — F0281 Dementia in other diseases classified elsewhere with behavioral disturbance: Secondary | ICD-10-CM | POA: Diagnosis not present

## 2019-12-16 DIAGNOSIS — N184 Chronic kidney disease, stage 4 (severe): Secondary | ICD-10-CM | POA: Diagnosis not present

## 2019-12-16 DIAGNOSIS — Z124 Encounter for screening for malignant neoplasm of cervix: Secondary | ICD-10-CM | POA: Diagnosis not present

## 2019-12-16 DIAGNOSIS — R519 Headache, unspecified: Secondary | ICD-10-CM | POA: Diagnosis not present

## 2019-12-16 DIAGNOSIS — N950 Postmenopausal bleeding: Secondary | ICD-10-CM | POA: Diagnosis not present

## 2019-12-16 DIAGNOSIS — I4891 Unspecified atrial fibrillation: Secondary | ICD-10-CM | POA: Diagnosis not present

## 2019-12-16 DIAGNOSIS — R809 Proteinuria, unspecified: Secondary | ICD-10-CM | POA: Diagnosis not present

## 2019-12-16 DIAGNOSIS — R7401 Elevation of levels of liver transaminase levels: Secondary | ICD-10-CM | POA: Diagnosis not present

## 2019-12-16 DIAGNOSIS — E119 Type 2 diabetes mellitus without complications: Secondary | ICD-10-CM | POA: Diagnosis not present

## 2019-12-16 DIAGNOSIS — I10 Essential (primary) hypertension: Secondary | ICD-10-CM | POA: Diagnosis not present

## 2019-12-16 DIAGNOSIS — Z20828 Contact with and (suspected) exposure to other viral communicable diseases: Secondary | ICD-10-CM | POA: Diagnosis not present

## 2019-12-16 DIAGNOSIS — Z6822 Body mass index (BMI) 22.0-22.9, adult: Secondary | ICD-10-CM | POA: Diagnosis not present

## 2019-12-16 DIAGNOSIS — M79672 Pain in left foot: Secondary | ICD-10-CM | POA: Diagnosis not present

## 2019-12-16 DIAGNOSIS — R002 Palpitations: Secondary | ICD-10-CM | POA: Diagnosis not present

## 2019-12-16 DIAGNOSIS — N840 Polyp of corpus uteri: Secondary | ICD-10-CM | POA: Diagnosis not present

## 2019-12-16 DIAGNOSIS — Z981 Arthrodesis status: Secondary | ICD-10-CM | POA: Diagnosis not present

## 2019-12-16 DIAGNOSIS — E785 Hyperlipidemia, unspecified: Secondary | ICD-10-CM | POA: Diagnosis not present

## 2019-12-16 DIAGNOSIS — M1711 Unilateral primary osteoarthritis, right knee: Secondary | ICD-10-CM | POA: Diagnosis not present

## 2019-12-16 DIAGNOSIS — D689 Coagulation defect, unspecified: Secondary | ICD-10-CM | POA: Diagnosis not present

## 2019-12-16 DIAGNOSIS — R739 Hyperglycemia, unspecified: Secondary | ICD-10-CM | POA: Diagnosis not present

## 2019-12-17 DIAGNOSIS — N3001 Acute cystitis with hematuria: Secondary | ICD-10-CM | POA: Diagnosis not present

## 2019-12-17 DIAGNOSIS — Z791 Long term (current) use of non-steroidal anti-inflammatories (NSAID): Secondary | ICD-10-CM | POA: Diagnosis not present

## 2019-12-17 DIAGNOSIS — H5704 Mydriasis: Secondary | ICD-10-CM | POA: Diagnosis not present

## 2019-12-17 DIAGNOSIS — R8290 Unspecified abnormal findings in urine: Secondary | ICD-10-CM | POA: Diagnosis not present

## 2019-12-17 DIAGNOSIS — E038 Other specified hypothyroidism: Secondary | ICD-10-CM | POA: Diagnosis not present

## 2019-12-17 DIAGNOSIS — R350 Frequency of micturition: Secondary | ICD-10-CM | POA: Diagnosis not present

## 2019-12-17 DIAGNOSIS — I129 Hypertensive chronic kidney disease with stage 1 through stage 4 chronic kidney disease, or unspecified chronic kidney disease: Secondary | ICD-10-CM | POA: Diagnosis not present

## 2019-12-17 DIAGNOSIS — H25813 Combined forms of age-related cataract, bilateral: Secondary | ICD-10-CM | POA: Diagnosis not present

## 2019-12-17 DIAGNOSIS — Z13220 Encounter for screening for lipoid disorders: Secondary | ICD-10-CM | POA: Diagnosis not present

## 2019-12-17 DIAGNOSIS — E7800 Pure hypercholesterolemia, unspecified: Secondary | ICD-10-CM | POA: Diagnosis not present

## 2019-12-17 DIAGNOSIS — R270 Ataxia, unspecified: Secondary | ICD-10-CM | POA: Diagnosis not present

## 2019-12-17 DIAGNOSIS — Z130 Encounter for screening for diseases of the blood and blood-forming organs and certain disorders involving the immune mechanism: Secondary | ICD-10-CM | POA: Diagnosis not present

## 2019-12-17 DIAGNOSIS — E875 Hyperkalemia: Secondary | ICD-10-CM | POA: Diagnosis not present

## 2019-12-17 DIAGNOSIS — R972 Elevated prostate specific antigen [PSA]: Secondary | ICD-10-CM | POA: Diagnosis not present

## 2019-12-17 DIAGNOSIS — Z79891 Long term (current) use of opiate analgesic: Secondary | ICD-10-CM | POA: Diagnosis not present

## 2019-12-17 DIAGNOSIS — Z13228 Encounter for screening for other metabolic disorders: Secondary | ICD-10-CM | POA: Diagnosis not present

## 2019-12-17 DIAGNOSIS — R300 Dysuria: Secondary | ICD-10-CM | POA: Diagnosis not present

## 2019-12-17 DIAGNOSIS — C9002 Multiple myeloma in relapse: Secondary | ICD-10-CM | POA: Diagnosis not present

## 2019-12-17 DIAGNOSIS — N179 Acute kidney failure, unspecified: Secondary | ICD-10-CM | POA: Diagnosis not present

## 2019-12-17 DIAGNOSIS — I4819 Other persistent atrial fibrillation: Secondary | ICD-10-CM | POA: Diagnosis not present

## 2019-12-17 DIAGNOSIS — C50412 Malignant neoplasm of upper-outer quadrant of left female breast: Secondary | ICD-10-CM | POA: Diagnosis not present

## 2019-12-17 DIAGNOSIS — E1140 Type 2 diabetes mellitus with diabetic neuropathy, unspecified: Secondary | ICD-10-CM | POA: Diagnosis not present

## 2019-12-17 DIAGNOSIS — M4316 Spondylolisthesis, lumbar region: Secondary | ICD-10-CM | POA: Diagnosis not present

## 2019-12-17 DIAGNOSIS — L03116 Cellulitis of left lower limb: Secondary | ICD-10-CM | POA: Diagnosis not present

## 2019-12-17 DIAGNOSIS — H53413 Scotoma involving central area, bilateral: Secondary | ICD-10-CM | POA: Diagnosis not present

## 2019-12-17 DIAGNOSIS — R739 Hyperglycemia, unspecified: Secondary | ICD-10-CM | POA: Diagnosis not present

## 2019-12-17 DIAGNOSIS — M1711 Unilateral primary osteoarthritis, right knee: Secondary | ICD-10-CM | POA: Diagnosis not present

## 2019-12-17 DIAGNOSIS — I480 Paroxysmal atrial fibrillation: Secondary | ICD-10-CM | POA: Diagnosis not present

## 2019-12-17 DIAGNOSIS — J449 Chronic obstructive pulmonary disease, unspecified: Secondary | ICD-10-CM | POA: Diagnosis not present

## 2019-12-17 DIAGNOSIS — K219 Gastro-esophageal reflux disease without esophagitis: Secondary | ICD-10-CM | POA: Diagnosis not present

## 2019-12-17 DIAGNOSIS — E119 Type 2 diabetes mellitus without complications: Secondary | ICD-10-CM | POA: Diagnosis not present

## 2019-12-17 DIAGNOSIS — Z9181 History of falling: Secondary | ICD-10-CM | POA: Diagnosis not present

## 2019-12-17 DIAGNOSIS — R197 Diarrhea, unspecified: Secondary | ICD-10-CM | POA: Diagnosis not present

## 2019-12-17 DIAGNOSIS — R609 Edema, unspecified: Secondary | ICD-10-CM | POA: Diagnosis not present

## 2019-12-17 DIAGNOSIS — Z124 Encounter for screening for malignant neoplasm of cervix: Secondary | ICD-10-CM | POA: Diagnosis not present

## 2019-12-17 DIAGNOSIS — G8929 Other chronic pain: Secondary | ICD-10-CM | POA: Diagnosis not present

## 2019-12-17 DIAGNOSIS — I499 Cardiac arrhythmia, unspecified: Secondary | ICD-10-CM | POA: Diagnosis not present

## 2019-12-17 DIAGNOSIS — Z0000 Encounter for general adult medical examination without abnormal findings: Secondary | ICD-10-CM | POA: Diagnosis not present

## 2019-12-17 DIAGNOSIS — M859 Disorder of bone density and structure, unspecified: Secondary | ICD-10-CM | POA: Diagnosis not present

## 2019-12-17 DIAGNOSIS — M25562 Pain in left knee: Secondary | ICD-10-CM | POA: Diagnosis not present

## 2019-12-17 DIAGNOSIS — R0600 Dyspnea, unspecified: Secondary | ICD-10-CM | POA: Diagnosis not present

## 2019-12-17 DIAGNOSIS — G894 Chronic pain syndrome: Secondary | ICD-10-CM | POA: Diagnosis not present

## 2019-12-17 DIAGNOSIS — E782 Mixed hyperlipidemia: Secondary | ICD-10-CM | POA: Diagnosis not present

## 2019-12-17 DIAGNOSIS — M6281 Muscle weakness (generalized): Secondary | ICD-10-CM | POA: Diagnosis not present

## 2019-12-17 DIAGNOSIS — H31093 Other chorioretinal scars, bilateral: Secondary | ICD-10-CM | POA: Diagnosis not present

## 2019-12-17 DIAGNOSIS — H35363 Drusen (degenerative) of macula, bilateral: Secondary | ICD-10-CM | POA: Diagnosis not present

## 2019-12-17 DIAGNOSIS — E559 Vitamin D deficiency, unspecified: Secondary | ICD-10-CM | POA: Diagnosis not present

## 2019-12-17 DIAGNOSIS — R0989 Other specified symptoms and signs involving the circulatory and respiratory systems: Secondary | ICD-10-CM | POA: Diagnosis not present

## 2019-12-17 DIAGNOSIS — L659 Nonscarring hair loss, unspecified: Secondary | ICD-10-CM | POA: Diagnosis not present

## 2019-12-17 DIAGNOSIS — C4491 Basal cell carcinoma of skin, unspecified: Secondary | ICD-10-CM | POA: Diagnosis not present

## 2019-12-17 DIAGNOSIS — R944 Abnormal results of kidney function studies: Secondary | ICD-10-CM | POA: Diagnosis not present

## 2019-12-17 DIAGNOSIS — M129 Arthropathy, unspecified: Secondary | ICD-10-CM | POA: Diagnosis not present

## 2019-12-17 DIAGNOSIS — E1142 Type 2 diabetes mellitus with diabetic polyneuropathy: Secondary | ICD-10-CM | POA: Diagnosis not present

## 2019-12-17 DIAGNOSIS — Z20828 Contact with and (suspected) exposure to other viral communicable diseases: Secondary | ICD-10-CM | POA: Diagnosis not present

## 2019-12-17 DIAGNOSIS — I440 Atrioventricular block, first degree: Secondary | ICD-10-CM | POA: Diagnosis not present

## 2019-12-17 DIAGNOSIS — M7062 Trochanteric bursitis, left hip: Secondary | ICD-10-CM | POA: Diagnosis not present

## 2019-12-17 DIAGNOSIS — I350 Nonrheumatic aortic (valve) stenosis: Secondary | ICD-10-CM | POA: Diagnosis not present

## 2019-12-17 DIAGNOSIS — N390 Urinary tract infection, site not specified: Secondary | ICD-10-CM | POA: Diagnosis not present

## 2019-12-17 DIAGNOSIS — M25551 Pain in right hip: Secondary | ICD-10-CM | POA: Diagnosis not present

## 2019-12-17 DIAGNOSIS — I130 Hypertensive heart and chronic kidney disease with heart failure and stage 1 through stage 4 chronic kidney disease, or unspecified chronic kidney disease: Secondary | ICD-10-CM | POA: Diagnosis not present

## 2019-12-17 DIAGNOSIS — Z20822 Contact with and (suspected) exposure to covid-19: Secondary | ICD-10-CM | POA: Diagnosis not present

## 2019-12-17 DIAGNOSIS — D500 Iron deficiency anemia secondary to blood loss (chronic): Secondary | ICD-10-CM | POA: Diagnosis not present

## 2019-12-17 DIAGNOSIS — H35342 Macular cyst, hole, or pseudohole, left eye: Secondary | ICD-10-CM | POA: Diagnosis not present

## 2019-12-17 DIAGNOSIS — E1159 Type 2 diabetes mellitus with other circulatory complications: Secondary | ICD-10-CM | POA: Diagnosis not present

## 2019-12-17 DIAGNOSIS — J439 Emphysema, unspecified: Secondary | ICD-10-CM | POA: Diagnosis not present

## 2019-12-17 DIAGNOSIS — N189 Chronic kidney disease, unspecified: Secondary | ICD-10-CM | POA: Diagnosis not present

## 2019-12-17 DIAGNOSIS — Z7689 Persons encountering health services in other specified circumstances: Secondary | ICD-10-CM | POA: Diagnosis not present

## 2019-12-17 DIAGNOSIS — D518 Other vitamin B12 deficiency anemias: Secondary | ICD-10-CM | POA: Diagnosis not present

## 2019-12-17 DIAGNOSIS — E7801 Familial hypercholesterolemia: Secondary | ICD-10-CM | POA: Diagnosis not present

## 2019-12-17 DIAGNOSIS — E039 Hypothyroidism, unspecified: Secondary | ICD-10-CM | POA: Diagnosis not present

## 2019-12-17 DIAGNOSIS — C61 Malignant neoplasm of prostate: Secondary | ICD-10-CM | POA: Diagnosis not present

## 2019-12-17 DIAGNOSIS — N2581 Secondary hyperparathyroidism of renal origin: Secondary | ICD-10-CM | POA: Diagnosis not present

## 2019-12-17 DIAGNOSIS — R634 Abnormal weight loss: Secondary | ICD-10-CM | POA: Diagnosis not present

## 2019-12-17 DIAGNOSIS — I509 Heart failure, unspecified: Secondary | ICD-10-CM | POA: Diagnosis not present

## 2019-12-17 DIAGNOSIS — R002 Palpitations: Secondary | ICD-10-CM | POA: Diagnosis not present

## 2019-12-17 DIAGNOSIS — N051 Unspecified nephritic syndrome with focal and segmental glomerular lesions: Secondary | ICD-10-CM | POA: Diagnosis not present

## 2019-12-17 DIAGNOSIS — N401 Enlarged prostate with lower urinary tract symptoms: Secondary | ICD-10-CM | POA: Diagnosis not present

## 2019-12-17 DIAGNOSIS — F329 Major depressive disorder, single episode, unspecified: Secondary | ICD-10-CM | POA: Diagnosis not present

## 2019-12-17 DIAGNOSIS — R5383 Other fatigue: Secondary | ICD-10-CM | POA: Diagnosis not present

## 2019-12-17 DIAGNOSIS — M25541 Pain in joints of right hand: Secondary | ICD-10-CM | POA: Diagnosis not present

## 2019-12-17 DIAGNOSIS — E785 Hyperlipidemia, unspecified: Secondary | ICD-10-CM | POA: Diagnosis not present

## 2019-12-17 DIAGNOSIS — Z125 Encounter for screening for malignant neoplasm of prostate: Secondary | ICD-10-CM | POA: Diagnosis not present

## 2019-12-17 DIAGNOSIS — R7301 Impaired fasting glucose: Secondary | ICD-10-CM | POA: Diagnosis not present

## 2019-12-17 DIAGNOSIS — G3184 Mild cognitive impairment, so stated: Secondary | ICD-10-CM | POA: Diagnosis not present

## 2019-12-17 DIAGNOSIS — Z1329 Encounter for screening for other suspected endocrine disorder: Secondary | ICD-10-CM | POA: Diagnosis not present

## 2019-12-17 DIAGNOSIS — E1169 Type 2 diabetes mellitus with other specified complication: Secondary | ICD-10-CM | POA: Diagnosis not present

## 2019-12-17 DIAGNOSIS — U071 COVID-19: Secondary | ICD-10-CM | POA: Diagnosis not present

## 2019-12-17 DIAGNOSIS — Z79899 Other long term (current) drug therapy: Secondary | ICD-10-CM | POA: Diagnosis not present

## 2019-12-17 DIAGNOSIS — M5136 Other intervertebral disc degeneration, lumbar region: Secondary | ICD-10-CM | POA: Diagnosis not present

## 2019-12-17 DIAGNOSIS — I10 Essential (primary) hypertension: Secondary | ICD-10-CM | POA: Diagnosis not present

## 2019-12-17 DIAGNOSIS — D72820 Lymphocytosis (symptomatic): Secondary | ICD-10-CM | POA: Diagnosis not present

## 2019-12-17 DIAGNOSIS — M1712 Unilateral primary osteoarthritis, left knee: Secondary | ICD-10-CM | POA: Diagnosis not present

## 2019-12-17 DIAGNOSIS — M7061 Trochanteric bursitis, right hip: Secondary | ICD-10-CM | POA: Diagnosis not present

## 2019-12-17 DIAGNOSIS — M545 Low back pain: Secondary | ICD-10-CM | POA: Diagnosis not present

## 2019-12-17 DIAGNOSIS — K5150 Left sided colitis without complications: Secondary | ICD-10-CM | POA: Diagnosis not present

## 2019-12-17 DIAGNOSIS — Z6841 Body Mass Index (BMI) 40.0 and over, adult: Secondary | ICD-10-CM | POA: Diagnosis not present

## 2019-12-17 DIAGNOSIS — N1832 Chronic kidney disease, stage 3b: Secondary | ICD-10-CM | POA: Diagnosis not present

## 2019-12-17 DIAGNOSIS — R7303 Prediabetes: Secondary | ICD-10-CM | POA: Diagnosis not present

## 2019-12-17 DIAGNOSIS — Z6836 Body mass index (BMI) 36.0-36.9, adult: Secondary | ICD-10-CM | POA: Diagnosis not present

## 2019-12-17 DIAGNOSIS — Z1331 Encounter for screening for depression: Secondary | ICD-10-CM | POA: Diagnosis not present

## 2019-12-17 DIAGNOSIS — I25118 Atherosclerotic heart disease of native coronary artery with other forms of angina pectoris: Secondary | ICD-10-CM | POA: Diagnosis not present

## 2019-12-17 DIAGNOSIS — M810 Age-related osteoporosis without current pathological fracture: Secondary | ICD-10-CM | POA: Diagnosis not present

## 2019-12-17 DIAGNOSIS — R82998 Other abnormal findings in urine: Secondary | ICD-10-CM | POA: Diagnosis not present

## 2019-12-17 DIAGNOSIS — I2510 Atherosclerotic heart disease of native coronary artery without angina pectoris: Secondary | ICD-10-CM | POA: Diagnosis not present

## 2019-12-17 DIAGNOSIS — Z7251 High risk heterosexual behavior: Secondary | ICD-10-CM | POA: Diagnosis not present

## 2019-12-17 DIAGNOSIS — Z1211 Encounter for screening for malignant neoplasm of colon: Secondary | ICD-10-CM | POA: Diagnosis not present

## 2019-12-17 DIAGNOSIS — R799 Abnormal finding of blood chemistry, unspecified: Secondary | ICD-10-CM | POA: Diagnosis not present

## 2019-12-18 DIAGNOSIS — R519 Headache, unspecified: Secondary | ICD-10-CM | POA: Diagnosis not present

## 2019-12-18 DIAGNOSIS — R05 Cough: Secondary | ICD-10-CM | POA: Diagnosis not present

## 2019-12-18 DIAGNOSIS — E782 Mixed hyperlipidemia: Secondary | ICD-10-CM | POA: Diagnosis not present

## 2019-12-18 DIAGNOSIS — R0602 Shortness of breath: Secondary | ICD-10-CM | POA: Diagnosis not present

## 2019-12-18 DIAGNOSIS — I10 Essential (primary) hypertension: Secondary | ICD-10-CM | POA: Diagnosis not present

## 2019-12-18 DIAGNOSIS — Z20828 Contact with and (suspected) exposure to other viral communicable diseases: Secondary | ICD-10-CM | POA: Diagnosis not present

## 2019-12-18 DIAGNOSIS — M109 Gout, unspecified: Secondary | ICD-10-CM | POA: Diagnosis not present

## 2019-12-18 DIAGNOSIS — J0140 Acute pansinusitis, unspecified: Secondary | ICD-10-CM | POA: Diagnosis not present

## 2019-12-18 DIAGNOSIS — R1031 Right lower quadrant pain: Secondary | ICD-10-CM | POA: Diagnosis not present

## 2019-12-19 DIAGNOSIS — I480 Paroxysmal atrial fibrillation: Secondary | ICD-10-CM | POA: Diagnosis not present

## 2019-12-19 DIAGNOSIS — R509 Fever, unspecified: Secondary | ICD-10-CM | POA: Diagnosis not present

## 2019-12-19 DIAGNOSIS — F0280 Dementia in other diseases classified elsewhere without behavioral disturbance: Secondary | ICD-10-CM | POA: Diagnosis not present

## 2019-12-19 DIAGNOSIS — I509 Heart failure, unspecified: Secondary | ICD-10-CM | POA: Diagnosis not present

## 2019-12-19 DIAGNOSIS — Z880 Allergy status to penicillin: Secondary | ICD-10-CM | POA: Diagnosis not present

## 2019-12-19 DIAGNOSIS — R0981 Nasal congestion: Secondary | ICD-10-CM | POA: Diagnosis not present

## 2019-12-19 DIAGNOSIS — K5900 Constipation, unspecified: Secondary | ICD-10-CM | POA: Diagnosis not present

## 2019-12-19 DIAGNOSIS — Z713 Dietary counseling and surveillance: Secondary | ICD-10-CM | POA: Diagnosis not present

## 2019-12-19 DIAGNOSIS — F419 Anxiety disorder, unspecified: Secondary | ICD-10-CM | POA: Diagnosis not present

## 2019-12-19 DIAGNOSIS — E1165 Type 2 diabetes mellitus with hyperglycemia: Secondary | ICD-10-CM | POA: Diagnosis not present

## 2019-12-19 DIAGNOSIS — I10 Essential (primary) hypertension: Secondary | ICD-10-CM | POA: Diagnosis not present

## 2019-12-19 DIAGNOSIS — I129 Hypertensive chronic kidney disease with stage 1 through stage 4 chronic kidney disease, or unspecified chronic kidney disease: Secondary | ICD-10-CM | POA: Diagnosis not present

## 2019-12-19 DIAGNOSIS — Z7401 Bed confinement status: Secondary | ICD-10-CM | POA: Diagnosis not present

## 2019-12-19 DIAGNOSIS — G459 Transient cerebral ischemic attack, unspecified: Secondary | ICD-10-CM | POA: Diagnosis not present

## 2019-12-19 DIAGNOSIS — Z66 Do not resuscitate: Secondary | ICD-10-CM | POA: Diagnosis not present

## 2019-12-19 DIAGNOSIS — U071 COVID-19: Secondary | ICD-10-CM | POA: Diagnosis not present

## 2019-12-19 DIAGNOSIS — F1520 Other stimulant dependence, uncomplicated: Secondary | ICD-10-CM | POA: Diagnosis not present

## 2019-12-19 DIAGNOSIS — N183 Chronic kidney disease, stage 3 unspecified: Secondary | ICD-10-CM | POA: Diagnosis not present

## 2019-12-19 DIAGNOSIS — J329 Chronic sinusitis, unspecified: Secondary | ICD-10-CM | POA: Diagnosis not present

## 2019-12-19 DIAGNOSIS — N1831 Chronic kidney disease, stage 3a: Secondary | ICD-10-CM | POA: Diagnosis not present

## 2019-12-19 DIAGNOSIS — Z993 Dependence on wheelchair: Secondary | ICD-10-CM | POA: Diagnosis not present

## 2019-12-19 DIAGNOSIS — I4891 Unspecified atrial fibrillation: Secondary | ICD-10-CM | POA: Diagnosis not present

## 2019-12-19 DIAGNOSIS — Z85828 Personal history of other malignant neoplasm of skin: Secondary | ICD-10-CM | POA: Diagnosis not present

## 2019-12-19 DIAGNOSIS — E11628 Type 2 diabetes mellitus with other skin complications: Secondary | ICD-10-CM | POA: Diagnosis not present

## 2019-12-19 DIAGNOSIS — R1310 Dysphagia, unspecified: Secondary | ICD-10-CM | POA: Diagnosis not present

## 2019-12-19 DIAGNOSIS — I5181 Takotsubo syndrome: Secondary | ICD-10-CM | POA: Diagnosis not present

## 2019-12-19 DIAGNOSIS — Z8673 Personal history of transient ischemic attack (TIA), and cerebral infarction without residual deficits: Secondary | ICD-10-CM | POA: Diagnosis not present

## 2019-12-19 DIAGNOSIS — M1009 Idiopathic gout, multiple sites: Secondary | ICD-10-CM | POA: Diagnosis not present

## 2019-12-19 DIAGNOSIS — I248 Other forms of acute ischemic heart disease: Secondary | ICD-10-CM | POA: Diagnosis not present

## 2019-12-19 DIAGNOSIS — E7800 Pure hypercholesterolemia, unspecified: Secondary | ICD-10-CM | POA: Diagnosis not present

## 2019-12-19 DIAGNOSIS — F0150 Vascular dementia without behavioral disturbance: Secondary | ICD-10-CM | POA: Diagnosis not present

## 2019-12-19 DIAGNOSIS — Z79899 Other long term (current) drug therapy: Secondary | ICD-10-CM | POA: Diagnosis not present

## 2019-12-19 DIAGNOSIS — E785 Hyperlipidemia, unspecified: Secondary | ICD-10-CM | POA: Diagnosis not present

## 2019-12-19 DIAGNOSIS — I422 Other hypertrophic cardiomyopathy: Secondary | ICD-10-CM | POA: Diagnosis not present

## 2019-12-19 DIAGNOSIS — E876 Hypokalemia: Secondary | ICD-10-CM | POA: Diagnosis not present

## 2019-12-19 DIAGNOSIS — J9601 Acute respiratory failure with hypoxia: Secondary | ICD-10-CM | POA: Diagnosis not present

## 2019-12-19 DIAGNOSIS — A4189 Other specified sepsis: Secondary | ICD-10-CM | POA: Diagnosis not present

## 2019-12-19 DIAGNOSIS — N390 Urinary tract infection, site not specified: Secondary | ICD-10-CM | POA: Diagnosis not present

## 2019-12-19 DIAGNOSIS — F329 Major depressive disorder, single episode, unspecified: Secondary | ICD-10-CM | POA: Diagnosis not present

## 2019-12-19 DIAGNOSIS — E1169 Type 2 diabetes mellitus with other specified complication: Secondary | ICD-10-CM | POA: Diagnosis not present

## 2019-12-19 DIAGNOSIS — G309 Alzheimer's disease, unspecified: Secondary | ICD-10-CM | POA: Diagnosis not present

## 2019-12-20 DIAGNOSIS — Z791 Long term (current) use of non-steroidal anti-inflammatories (NSAID): Secondary | ICD-10-CM | POA: Diagnosis not present

## 2019-12-20 DIAGNOSIS — E1129 Type 2 diabetes mellitus with other diabetic kidney complication: Secondary | ICD-10-CM | POA: Diagnosis not present

## 2019-12-20 DIAGNOSIS — R55 Syncope and collapse: Secondary | ICD-10-CM | POA: Diagnosis not present

## 2019-12-20 DIAGNOSIS — I714 Abdominal aortic aneurysm, without rupture: Secondary | ICD-10-CM | POA: Diagnosis not present

## 2019-12-20 DIAGNOSIS — R531 Weakness: Secondary | ICD-10-CM | POA: Diagnosis not present

## 2019-12-20 DIAGNOSIS — R748 Abnormal levels of other serum enzymes: Secondary | ICD-10-CM | POA: Diagnosis not present

## 2019-12-20 DIAGNOSIS — R3914 Feeling of incomplete bladder emptying: Secondary | ICD-10-CM | POA: Diagnosis not present

## 2019-12-20 DIAGNOSIS — Z20828 Contact with and (suspected) exposure to other viral communicable diseases: Secondary | ICD-10-CM | POA: Diagnosis not present

## 2019-12-20 DIAGNOSIS — M5442 Lumbago with sciatica, left side: Secondary | ICD-10-CM | POA: Diagnosis not present

## 2019-12-20 DIAGNOSIS — N1831 Chronic kidney disease, stage 3a: Secondary | ICD-10-CM | POA: Diagnosis not present

## 2019-12-20 DIAGNOSIS — N186 End stage renal disease: Secondary | ICD-10-CM | POA: Diagnosis not present

## 2019-12-20 DIAGNOSIS — R82998 Other abnormal findings in urine: Secondary | ICD-10-CM | POA: Diagnosis not present

## 2019-12-20 DIAGNOSIS — M47816 Spondylosis without myelopathy or radiculopathy, lumbar region: Secondary | ICD-10-CM | POA: Diagnosis not present

## 2019-12-20 DIAGNOSIS — M47817 Spondylosis without myelopathy or radiculopathy, lumbosacral region: Secondary | ICD-10-CM | POA: Diagnosis not present

## 2019-12-20 DIAGNOSIS — Z952 Presence of prosthetic heart valve: Secondary | ICD-10-CM | POA: Diagnosis not present

## 2019-12-20 DIAGNOSIS — F419 Anxiety disorder, unspecified: Secondary | ICD-10-CM | POA: Diagnosis not present

## 2019-12-20 DIAGNOSIS — R270 Ataxia, unspecified: Secondary | ICD-10-CM | POA: Diagnosis not present

## 2019-12-20 DIAGNOSIS — D696 Thrombocytopenia, unspecified: Secondary | ICD-10-CM | POA: Diagnosis not present

## 2019-12-20 DIAGNOSIS — G40909 Epilepsy, unspecified, not intractable, without status epilepticus: Secondary | ICD-10-CM | POA: Diagnosis not present

## 2019-12-20 DIAGNOSIS — I129 Hypertensive chronic kidney disease with stage 1 through stage 4 chronic kidney disease, or unspecified chronic kidney disease: Secondary | ICD-10-CM | POA: Diagnosis not present

## 2019-12-20 DIAGNOSIS — J449 Chronic obstructive pulmonary disease, unspecified: Secondary | ICD-10-CM | POA: Diagnosis not present

## 2019-12-20 DIAGNOSIS — L97919 Non-pressure chronic ulcer of unspecified part of right lower leg with unspecified severity: Secondary | ICD-10-CM | POA: Diagnosis not present

## 2019-12-20 DIAGNOSIS — Z298 Encounter for other specified prophylactic measures: Secondary | ICD-10-CM | POA: Diagnosis not present

## 2019-12-20 DIAGNOSIS — N2581 Secondary hyperparathyroidism of renal origin: Secondary | ICD-10-CM | POA: Diagnosis not present

## 2019-12-20 DIAGNOSIS — H548 Legal blindness, as defined in USA: Secondary | ICD-10-CM | POA: Diagnosis not present

## 2019-12-20 DIAGNOSIS — F0280 Dementia in other diseases classified elsewhere without behavioral disturbance: Secondary | ICD-10-CM | POA: Diagnosis not present

## 2019-12-20 DIAGNOSIS — I472 Ventricular tachycardia: Secondary | ICD-10-CM | POA: Diagnosis not present

## 2019-12-20 DIAGNOSIS — E785 Hyperlipidemia, unspecified: Secondary | ICD-10-CM | POA: Diagnosis not present

## 2019-12-20 DIAGNOSIS — Z7982 Long term (current) use of aspirin: Secondary | ICD-10-CM | POA: Diagnosis not present

## 2019-12-20 DIAGNOSIS — I10 Essential (primary) hypertension: Secondary | ICD-10-CM | POA: Diagnosis not present

## 2019-12-20 DIAGNOSIS — Z125 Encounter for screening for malignant neoplasm of prostate: Secondary | ICD-10-CM | POA: Diagnosis not present

## 2019-12-20 DIAGNOSIS — E1121 Type 2 diabetes mellitus with diabetic nephropathy: Secondary | ICD-10-CM | POA: Diagnosis not present

## 2019-12-20 DIAGNOSIS — K219 Gastro-esophageal reflux disease without esophagitis: Secondary | ICD-10-CM | POA: Diagnosis not present

## 2019-12-20 DIAGNOSIS — R3981 Functional urinary incontinence: Secondary | ICD-10-CM | POA: Diagnosis not present

## 2019-12-20 DIAGNOSIS — R296 Repeated falls: Secondary | ICD-10-CM | POA: Diagnosis not present

## 2019-12-20 DIAGNOSIS — Z992 Dependence on renal dialysis: Secondary | ICD-10-CM | POA: Diagnosis not present

## 2019-12-20 DIAGNOSIS — K5792 Diverticulitis of intestine, part unspecified, without perforation or abscess without bleeding: Secondary | ICD-10-CM | POA: Diagnosis not present

## 2019-12-20 DIAGNOSIS — L89320 Pressure ulcer of left buttock, unstageable: Secondary | ICD-10-CM | POA: Diagnosis not present

## 2019-12-20 DIAGNOSIS — L89312 Pressure ulcer of right buttock, stage 2: Secondary | ICD-10-CM | POA: Diagnosis not present

## 2019-12-20 DIAGNOSIS — M9901 Segmental and somatic dysfunction of cervical region: Secondary | ICD-10-CM | POA: Diagnosis not present

## 2019-12-20 DIAGNOSIS — Z6828 Body mass index (BMI) 28.0-28.9, adult: Secondary | ICD-10-CM | POA: Diagnosis not present

## 2019-12-20 DIAGNOSIS — E3450 Androgen insensitivity syndrome, unspecified: Secondary | ICD-10-CM | POA: Diagnosis not present

## 2019-12-20 DIAGNOSIS — G43909 Migraine, unspecified, not intractable, without status migrainosus: Secondary | ICD-10-CM | POA: Diagnosis not present

## 2019-12-20 DIAGNOSIS — I4811 Longstanding persistent atrial fibrillation: Secondary | ICD-10-CM | POA: Diagnosis not present

## 2019-12-20 DIAGNOSIS — F0390 Unspecified dementia without behavioral disturbance: Secondary | ICD-10-CM | POA: Diagnosis not present

## 2019-12-20 DIAGNOSIS — C50412 Malignant neoplasm of upper-outer quadrant of left female breast: Secondary | ICD-10-CM | POA: Diagnosis not present

## 2019-12-20 DIAGNOSIS — M5136 Other intervertebral disc degeneration, lumbar region: Secondary | ICD-10-CM | POA: Diagnosis not present

## 2019-12-20 DIAGNOSIS — M810 Age-related osteoporosis without current pathological fracture: Secondary | ICD-10-CM | POA: Diagnosis not present

## 2019-12-20 DIAGNOSIS — E1159 Type 2 diabetes mellitus with other circulatory complications: Secondary | ICD-10-CM | POA: Diagnosis not present

## 2019-12-20 DIAGNOSIS — Z96612 Presence of left artificial shoulder joint: Secondary | ICD-10-CM | POA: Diagnosis not present

## 2019-12-20 DIAGNOSIS — R87612 Low grade squamous intraepithelial lesion on cytologic smear of cervix (LGSIL): Secondary | ICD-10-CM | POA: Diagnosis not present

## 2019-12-20 DIAGNOSIS — M869 Osteomyelitis, unspecified: Secondary | ICD-10-CM | POA: Diagnosis not present

## 2019-12-20 DIAGNOSIS — K5900 Constipation, unspecified: Secondary | ICD-10-CM | POA: Diagnosis not present

## 2019-12-20 DIAGNOSIS — E871 Hypo-osmolality and hyponatremia: Secondary | ICD-10-CM | POA: Diagnosis not present

## 2019-12-20 DIAGNOSIS — R42 Dizziness and giddiness: Secondary | ICD-10-CM | POA: Diagnosis not present

## 2019-12-20 DIAGNOSIS — M1711 Unilateral primary osteoarthritis, right knee: Secondary | ICD-10-CM | POA: Diagnosis not present

## 2019-12-20 DIAGNOSIS — G894 Chronic pain syndrome: Secondary | ICD-10-CM | POA: Diagnosis not present

## 2019-12-20 DIAGNOSIS — Z4781 Encounter for orthopedic aftercare following surgical amputation: Secondary | ICD-10-CM | POA: Diagnosis not present

## 2019-12-20 DIAGNOSIS — D72829 Elevated white blood cell count, unspecified: Secondary | ICD-10-CM | POA: Diagnosis not present

## 2019-12-20 DIAGNOSIS — I69328 Other speech and language deficits following cerebral infarction: Secondary | ICD-10-CM | POA: Diagnosis not present

## 2019-12-20 DIAGNOSIS — I5032 Chronic diastolic (congestive) heart failure: Secondary | ICD-10-CM | POA: Diagnosis not present

## 2019-12-20 DIAGNOSIS — I25119 Atherosclerotic heart disease of native coronary artery with unspecified angina pectoris: Secondary | ICD-10-CM | POA: Diagnosis not present

## 2019-12-20 DIAGNOSIS — N184 Chronic kidney disease, stage 4 (severe): Secondary | ICD-10-CM | POA: Diagnosis not present

## 2019-12-20 DIAGNOSIS — E8881 Metabolic syndrome: Secondary | ICD-10-CM | POA: Diagnosis not present

## 2019-12-20 DIAGNOSIS — G3184 Mild cognitive impairment, so stated: Secondary | ICD-10-CM | POA: Diagnosis not present

## 2019-12-20 DIAGNOSIS — F17210 Nicotine dependence, cigarettes, uncomplicated: Secondary | ICD-10-CM | POA: Diagnosis not present

## 2019-12-20 DIAGNOSIS — F17200 Nicotine dependence, unspecified, uncomplicated: Secondary | ICD-10-CM | POA: Diagnosis not present

## 2019-12-20 DIAGNOSIS — R413 Other amnesia: Secondary | ICD-10-CM | POA: Diagnosis not present

## 2019-12-20 DIAGNOSIS — I132 Hypertensive heart and chronic kidney disease with heart failure and with stage 5 chronic kidney disease, or end stage renal disease: Secondary | ICD-10-CM | POA: Diagnosis not present

## 2019-12-20 DIAGNOSIS — G459 Transient cerebral ischemic attack, unspecified: Secondary | ICD-10-CM | POA: Diagnosis not present

## 2019-12-20 DIAGNOSIS — K921 Melena: Secondary | ICD-10-CM | POA: Diagnosis not present

## 2019-12-20 DIAGNOSIS — R799 Abnormal finding of blood chemistry, unspecified: Secondary | ICD-10-CM | POA: Diagnosis not present

## 2019-12-20 DIAGNOSIS — Z1389 Encounter for screening for other disorder: Secondary | ICD-10-CM | POA: Diagnosis not present

## 2019-12-20 DIAGNOSIS — I69351 Hemiplegia and hemiparesis following cerebral infarction affecting right dominant side: Secondary | ICD-10-CM | POA: Diagnosis not present

## 2019-12-20 DIAGNOSIS — R079 Chest pain, unspecified: Secondary | ICD-10-CM | POA: Diagnosis not present

## 2019-12-20 DIAGNOSIS — E7849 Other hyperlipidemia: Secondary | ICD-10-CM | POA: Diagnosis not present

## 2019-12-20 DIAGNOSIS — S72012D Unspecified intracapsular fracture of left femur, subsequent encounter for closed fracture with routine healing: Secondary | ICD-10-CM | POA: Diagnosis not present

## 2019-12-20 DIAGNOSIS — Z794 Long term (current) use of insulin: Secondary | ICD-10-CM | POA: Diagnosis not present

## 2019-12-20 DIAGNOSIS — I824Z2 Acute embolism and thrombosis of unspecified deep veins of left distal lower extremity: Secondary | ICD-10-CM | POA: Diagnosis not present

## 2019-12-20 DIAGNOSIS — R471 Dysarthria and anarthria: Secondary | ICD-10-CM | POA: Diagnosis not present

## 2019-12-20 DIAGNOSIS — S81802D Unspecified open wound, left lower leg, subsequent encounter: Secondary | ICD-10-CM | POA: Diagnosis not present

## 2019-12-20 DIAGNOSIS — G300 Alzheimer's disease with early onset: Secondary | ICD-10-CM | POA: Diagnosis not present

## 2019-12-20 DIAGNOSIS — I130 Hypertensive heart and chronic kidney disease with heart failure and stage 1 through stage 4 chronic kidney disease, or unspecified chronic kidney disease: Secondary | ICD-10-CM | POA: Diagnosis not present

## 2019-12-20 DIAGNOSIS — E1142 Type 2 diabetes mellitus with diabetic polyneuropathy: Secondary | ICD-10-CM | POA: Diagnosis not present

## 2019-12-20 DIAGNOSIS — Z13220 Encounter for screening for lipoid disorders: Secondary | ICD-10-CM | POA: Diagnosis not present

## 2019-12-20 DIAGNOSIS — G9341 Metabolic encephalopathy: Secondary | ICD-10-CM | POA: Diagnosis not present

## 2019-12-20 DIAGNOSIS — K5790 Diverticulosis of intestine, part unspecified, without perforation or abscess without bleeding: Secondary | ICD-10-CM | POA: Diagnosis not present

## 2019-12-20 DIAGNOSIS — L89150 Pressure ulcer of sacral region, unstageable: Secondary | ICD-10-CM | POA: Diagnosis not present

## 2019-12-20 DIAGNOSIS — E860 Dehydration: Secondary | ICD-10-CM | POA: Diagnosis not present

## 2019-12-20 DIAGNOSIS — E1169 Type 2 diabetes mellitus with other specified complication: Secondary | ICD-10-CM | POA: Diagnosis not present

## 2019-12-20 DIAGNOSIS — I252 Old myocardial infarction: Secondary | ICD-10-CM | POA: Diagnosis not present

## 2019-12-20 DIAGNOSIS — M0600 Rheumatoid arthritis without rheumatoid factor, unspecified site: Secondary | ICD-10-CM | POA: Diagnosis not present

## 2019-12-20 DIAGNOSIS — N8111 Cystocele, midline: Secondary | ICD-10-CM | POA: Diagnosis not present

## 2019-12-20 DIAGNOSIS — D849 Immunodeficiency, unspecified: Secondary | ICD-10-CM | POA: Diagnosis not present

## 2019-12-20 DIAGNOSIS — Z7689 Persons encountering health services in other specified circumstances: Secondary | ICD-10-CM | POA: Diagnosis not present

## 2019-12-20 DIAGNOSIS — M48062 Spinal stenosis, lumbar region with neurogenic claudication: Secondary | ICD-10-CM | POA: Diagnosis not present

## 2019-12-20 DIAGNOSIS — R600 Localized edema: Secondary | ICD-10-CM | POA: Diagnosis not present

## 2019-12-20 DIAGNOSIS — Z1159 Encounter for screening for other viral diseases: Secondary | ICD-10-CM | POA: Diagnosis not present

## 2019-12-20 DIAGNOSIS — I480 Paroxysmal atrial fibrillation: Secondary | ICD-10-CM | POA: Diagnosis not present

## 2019-12-20 DIAGNOSIS — J45909 Unspecified asthma, uncomplicated: Secondary | ICD-10-CM | POA: Diagnosis not present

## 2019-12-20 DIAGNOSIS — D649 Anemia, unspecified: Secondary | ICD-10-CM | POA: Diagnosis not present

## 2019-12-20 DIAGNOSIS — Z20822 Contact with and (suspected) exposure to covid-19: Secondary | ICD-10-CM | POA: Diagnosis not present

## 2019-12-20 DIAGNOSIS — M0579 Rheumatoid arthritis with rheumatoid factor of multiple sites without organ or systems involvement: Secondary | ICD-10-CM | POA: Diagnosis not present

## 2019-12-20 DIAGNOSIS — Z7189 Other specified counseling: Secondary | ICD-10-CM | POA: Diagnosis not present

## 2019-12-20 DIAGNOSIS — R2689 Other abnormalities of gait and mobility: Secondary | ICD-10-CM | POA: Diagnosis not present

## 2019-12-20 DIAGNOSIS — E1122 Type 2 diabetes mellitus with diabetic chronic kidney disease: Secondary | ICD-10-CM | POA: Diagnosis not present

## 2019-12-20 DIAGNOSIS — M5116 Intervertebral disc disorders with radiculopathy, lumbar region: Secondary | ICD-10-CM | POA: Diagnosis not present

## 2019-12-20 DIAGNOSIS — L89152 Pressure ulcer of sacral region, stage 2: Secondary | ICD-10-CM | POA: Diagnosis not present

## 2019-12-20 DIAGNOSIS — Z7984 Long term (current) use of oral hypoglycemic drugs: Secondary | ICD-10-CM | POA: Diagnosis not present

## 2019-12-20 DIAGNOSIS — R339 Retention of urine, unspecified: Secondary | ICD-10-CM | POA: Diagnosis not present

## 2019-12-20 DIAGNOSIS — I872 Venous insufficiency (chronic) (peripheral): Secondary | ICD-10-CM | POA: Diagnosis not present

## 2019-12-20 DIAGNOSIS — R7303 Prediabetes: Secondary | ICD-10-CM | POA: Diagnosis not present

## 2019-12-20 DIAGNOSIS — D539 Nutritional anemia, unspecified: Secondary | ICD-10-CM | POA: Diagnosis not present

## 2019-12-20 DIAGNOSIS — I4891 Unspecified atrial fibrillation: Secondary | ICD-10-CM | POA: Diagnosis not present

## 2019-12-20 DIAGNOSIS — M9903 Segmental and somatic dysfunction of lumbar region: Secondary | ICD-10-CM | POA: Diagnosis not present

## 2019-12-20 DIAGNOSIS — N390 Urinary tract infection, site not specified: Secondary | ICD-10-CM | POA: Diagnosis not present

## 2019-12-20 DIAGNOSIS — I4821 Permanent atrial fibrillation: Secondary | ICD-10-CM | POA: Diagnosis not present

## 2019-12-20 DIAGNOSIS — R7989 Other specified abnormal findings of blood chemistry: Secondary | ICD-10-CM | POA: Diagnosis not present

## 2019-12-20 DIAGNOSIS — M5416 Radiculopathy, lumbar region: Secondary | ICD-10-CM | POA: Diagnosis not present

## 2019-12-20 DIAGNOSIS — C3490 Malignant neoplasm of unspecified part of unspecified bronchus or lung: Secondary | ICD-10-CM | POA: Diagnosis not present

## 2019-12-20 DIAGNOSIS — Z1231 Encounter for screening mammogram for malignant neoplasm of breast: Secondary | ICD-10-CM | POA: Diagnosis not present

## 2019-12-20 DIAGNOSIS — N1832 Chronic kidney disease, stage 3b: Secondary | ICD-10-CM | POA: Diagnosis not present

## 2019-12-20 DIAGNOSIS — Z9981 Dependence on supplemental oxygen: Secondary | ICD-10-CM | POA: Diagnosis not present

## 2019-12-20 DIAGNOSIS — M5134 Other intervertebral disc degeneration, thoracic region: Secondary | ICD-10-CM | POA: Diagnosis not present

## 2019-12-20 DIAGNOSIS — I5040 Unspecified combined systolic (congestive) and diastolic (congestive) heart failure: Secondary | ICD-10-CM | POA: Diagnosis not present

## 2019-12-20 DIAGNOSIS — R739 Hyperglycemia, unspecified: Secondary | ICD-10-CM | POA: Diagnosis not present

## 2019-12-20 DIAGNOSIS — Z0000 Encounter for general adult medical examination without abnormal findings: Secondary | ICD-10-CM | POA: Diagnosis not present

## 2019-12-20 DIAGNOSIS — G4700 Insomnia, unspecified: Secondary | ICD-10-CM | POA: Diagnosis not present

## 2019-12-20 DIAGNOSIS — L03116 Cellulitis of left lower limb: Secondary | ICD-10-CM | POA: Diagnosis not present

## 2019-12-20 DIAGNOSIS — I428 Other cardiomyopathies: Secondary | ICD-10-CM | POA: Diagnosis not present

## 2019-12-20 DIAGNOSIS — M8580 Other specified disorders of bone density and structure, unspecified site: Secondary | ICD-10-CM | POA: Diagnosis not present

## 2019-12-20 DIAGNOSIS — E1165 Type 2 diabetes mellitus with hyperglycemia: Secondary | ICD-10-CM | POA: Diagnosis not present

## 2019-12-20 DIAGNOSIS — Z9181 History of falling: Secondary | ICD-10-CM | POA: Diagnosis not present

## 2019-12-20 DIAGNOSIS — Z8673 Personal history of transient ischemic attack (TIA), and cerebral infarction without residual deficits: Secondary | ICD-10-CM | POA: Diagnosis not present

## 2019-12-20 DIAGNOSIS — N400 Enlarged prostate without lower urinary tract symptoms: Secondary | ICD-10-CM | POA: Diagnosis not present

## 2019-12-20 DIAGNOSIS — Z951 Presence of aortocoronary bypass graft: Secondary | ICD-10-CM | POA: Diagnosis not present

## 2019-12-20 DIAGNOSIS — L89310 Pressure ulcer of right buttock, unstageable: Secondary | ICD-10-CM | POA: Diagnosis not present

## 2019-12-20 DIAGNOSIS — R41841 Cognitive communication deficit: Secondary | ICD-10-CM | POA: Diagnosis not present

## 2019-12-20 DIAGNOSIS — Z955 Presence of coronary angioplasty implant and graft: Secondary | ICD-10-CM | POA: Diagnosis not present

## 2019-12-20 DIAGNOSIS — Z113 Encounter for screening for infections with a predominantly sexual mode of transmission: Secondary | ICD-10-CM | POA: Diagnosis not present

## 2019-12-20 DIAGNOSIS — J42 Unspecified chronic bronchitis: Secondary | ICD-10-CM | POA: Diagnosis not present

## 2019-12-20 DIAGNOSIS — I5042 Chronic combined systolic (congestive) and diastolic (congestive) heart failure: Secondary | ICD-10-CM | POA: Diagnosis not present

## 2019-12-20 DIAGNOSIS — D509 Iron deficiency anemia, unspecified: Secondary | ICD-10-CM | POA: Diagnosis not present

## 2019-12-20 DIAGNOSIS — Z23 Encounter for immunization: Secondary | ICD-10-CM | POA: Diagnosis not present

## 2019-12-20 DIAGNOSIS — E079 Disorder of thyroid, unspecified: Secondary | ICD-10-CM | POA: Diagnosis not present

## 2019-12-20 DIAGNOSIS — R609 Edema, unspecified: Secondary | ICD-10-CM | POA: Diagnosis not present

## 2019-12-20 DIAGNOSIS — E119 Type 2 diabetes mellitus without complications: Secondary | ICD-10-CM | POA: Diagnosis not present

## 2019-12-20 DIAGNOSIS — R7309 Other abnormal glucose: Secondary | ICD-10-CM | POA: Diagnosis not present

## 2019-12-20 DIAGNOSIS — I2510 Atherosclerotic heart disease of native coronary artery without angina pectoris: Secondary | ICD-10-CM | POA: Diagnosis not present

## 2019-12-20 DIAGNOSIS — I340 Nonrheumatic mitral (valve) insufficiency: Secondary | ICD-10-CM | POA: Diagnosis not present

## 2019-12-20 DIAGNOSIS — D631 Anemia in chronic kidney disease: Secondary | ICD-10-CM | POA: Diagnosis not present

## 2019-12-20 DIAGNOSIS — B351 Tinea unguium: Secondary | ICD-10-CM | POA: Diagnosis not present

## 2019-12-20 DIAGNOSIS — R300 Dysuria: Secondary | ICD-10-CM | POA: Diagnosis not present

## 2019-12-20 DIAGNOSIS — R1310 Dysphagia, unspecified: Secondary | ICD-10-CM | POA: Diagnosis not present

## 2019-12-20 DIAGNOSIS — C61 Malignant neoplasm of prostate: Secondary | ICD-10-CM | POA: Diagnosis not present

## 2019-12-20 DIAGNOSIS — Z944 Liver transplant status: Secondary | ICD-10-CM | POA: Diagnosis not present

## 2019-12-20 DIAGNOSIS — E782 Mixed hyperlipidemia: Secondary | ICD-10-CM | POA: Diagnosis not present

## 2019-12-20 DIAGNOSIS — G4730 Sleep apnea, unspecified: Secondary | ICD-10-CM | POA: Diagnosis not present

## 2019-12-20 DIAGNOSIS — M9905 Segmental and somatic dysfunction of pelvic region: Secondary | ICD-10-CM | POA: Diagnosis not present

## 2019-12-20 DIAGNOSIS — R5383 Other fatigue: Secondary | ICD-10-CM | POA: Diagnosis not present

## 2019-12-20 DIAGNOSIS — N3946 Mixed incontinence: Secondary | ICD-10-CM | POA: Diagnosis not present

## 2019-12-20 DIAGNOSIS — R2681 Unsteadiness on feet: Secondary | ICD-10-CM | POA: Diagnosis not present

## 2019-12-20 DIAGNOSIS — E663 Overweight: Secondary | ICD-10-CM | POA: Diagnosis not present

## 2019-12-20 DIAGNOSIS — J849 Interstitial pulmonary disease, unspecified: Secondary | ICD-10-CM | POA: Diagnosis not present

## 2019-12-20 DIAGNOSIS — R52 Pain, unspecified: Secondary | ICD-10-CM | POA: Diagnosis not present

## 2019-12-20 DIAGNOSIS — E1140 Type 2 diabetes mellitus with diabetic neuropathy, unspecified: Secondary | ICD-10-CM | POA: Diagnosis not present

## 2019-12-20 DIAGNOSIS — I495 Sick sinus syndrome: Secondary | ICD-10-CM | POA: Diagnosis not present

## 2019-12-20 DIAGNOSIS — I69398 Other sequelae of cerebral infarction: Secondary | ICD-10-CM | POA: Diagnosis not present

## 2019-12-20 DIAGNOSIS — R4701 Aphasia: Secondary | ICD-10-CM | POA: Diagnosis not present

## 2019-12-20 DIAGNOSIS — F1120 Opioid dependence, uncomplicated: Secondary | ICD-10-CM | POA: Diagnosis not present

## 2019-12-20 DIAGNOSIS — M961 Postlaminectomy syndrome, not elsewhere classified: Secondary | ICD-10-CM | POA: Diagnosis not present

## 2019-12-20 DIAGNOSIS — E1151 Type 2 diabetes mellitus with diabetic peripheral angiopathy without gangrene: Secondary | ICD-10-CM | POA: Diagnosis not present

## 2019-12-20 DIAGNOSIS — I69354 Hemiplegia and hemiparesis following cerebral infarction affecting left non-dominant side: Secondary | ICD-10-CM | POA: Diagnosis not present

## 2019-12-20 DIAGNOSIS — E11319 Type 2 diabetes mellitus with unspecified diabetic retinopathy without macular edema: Secondary | ICD-10-CM | POA: Diagnosis not present

## 2019-12-20 DIAGNOSIS — Z01812 Encounter for preprocedural laboratory examination: Secondary | ICD-10-CM | POA: Diagnosis not present

## 2019-12-20 DIAGNOSIS — Z0001 Encounter for general adult medical examination with abnormal findings: Secondary | ICD-10-CM | POA: Diagnosis not present

## 2019-12-20 DIAGNOSIS — R1013 Epigastric pain: Secondary | ICD-10-CM | POA: Diagnosis not present

## 2019-12-20 DIAGNOSIS — G629 Polyneuropathy, unspecified: Secondary | ICD-10-CM | POA: Diagnosis not present

## 2019-12-20 DIAGNOSIS — G4733 Obstructive sleep apnea (adult) (pediatric): Secondary | ICD-10-CM | POA: Diagnosis not present

## 2019-12-20 DIAGNOSIS — R293 Abnormal posture: Secondary | ICD-10-CM | POA: Diagnosis not present

## 2019-12-20 DIAGNOSIS — I69928 Other speech and language deficits following unspecified cerebrovascular disease: Secondary | ICD-10-CM | POA: Diagnosis not present

## 2019-12-20 DIAGNOSIS — M064 Inflammatory polyarthropathy: Secondary | ICD-10-CM | POA: Diagnosis not present

## 2019-12-20 DIAGNOSIS — Z95810 Presence of automatic (implantable) cardiac defibrillator: Secondary | ICD-10-CM | POA: Diagnosis not present

## 2019-12-20 DIAGNOSIS — Z6829 Body mass index (BMI) 29.0-29.9, adult: Secondary | ICD-10-CM | POA: Diagnosis not present

## 2019-12-20 DIAGNOSIS — G8929 Other chronic pain: Secondary | ICD-10-CM | POA: Diagnosis not present

## 2019-12-20 DIAGNOSIS — L03115 Cellulitis of right lower limb: Secondary | ICD-10-CM | POA: Diagnosis not present

## 2019-12-20 DIAGNOSIS — Z87891 Personal history of nicotine dependence: Secondary | ICD-10-CM | POA: Diagnosis not present

## 2019-12-20 DIAGNOSIS — N179 Acute kidney failure, unspecified: Secondary | ICD-10-CM | POA: Diagnosis not present

## 2019-12-20 DIAGNOSIS — F5101 Primary insomnia: Secondary | ICD-10-CM | POA: Diagnosis not present

## 2019-12-20 DIAGNOSIS — Z0189 Encounter for other specified special examinations: Secondary | ICD-10-CM | POA: Diagnosis not present

## 2019-12-20 DIAGNOSIS — M6281 Muscle weakness (generalized): Secondary | ICD-10-CM | POA: Diagnosis not present

## 2019-12-20 DIAGNOSIS — M792 Neuralgia and neuritis, unspecified: Secondary | ICD-10-CM | POA: Diagnosis not present

## 2019-12-20 DIAGNOSIS — R5382 Chronic fatigue, unspecified: Secondary | ICD-10-CM | POA: Diagnosis not present

## 2019-12-20 DIAGNOSIS — D689 Coagulation defect, unspecified: Secondary | ICD-10-CM | POA: Diagnosis not present

## 2019-12-20 DIAGNOSIS — T8131XA Disruption of external operation (surgical) wound, not elsewhere classified, initial encounter: Secondary | ICD-10-CM | POA: Diagnosis not present

## 2019-12-20 DIAGNOSIS — I110 Hypertensive heart disease with heart failure: Secondary | ICD-10-CM | POA: Diagnosis not present

## 2019-12-20 DIAGNOSIS — R790 Abnormal level of blood mineral: Secondary | ICD-10-CM | POA: Diagnosis not present

## 2019-12-20 DIAGNOSIS — M160 Bilateral primary osteoarthritis of hip: Secondary | ICD-10-CM | POA: Diagnosis not present

## 2019-12-20 DIAGNOSIS — R972 Elevated prostate specific antigen [PSA]: Secondary | ICD-10-CM | POA: Diagnosis not present

## 2019-12-20 DIAGNOSIS — M955 Acquired deformity of pelvis: Secondary | ICD-10-CM | POA: Diagnosis not present

## 2019-12-20 DIAGNOSIS — E038 Other specified hypothyroidism: Secondary | ICD-10-CM | POA: Diagnosis not present

## 2019-12-20 DIAGNOSIS — F331 Major depressive disorder, recurrent, moderate: Secondary | ICD-10-CM | POA: Diagnosis not present

## 2019-12-20 DIAGNOSIS — M797 Fibromyalgia: Secondary | ICD-10-CM | POA: Diagnosis not present

## 2019-12-20 DIAGNOSIS — M109 Gout, unspecified: Secondary | ICD-10-CM | POA: Diagnosis not present

## 2019-12-20 DIAGNOSIS — I4819 Other persistent atrial fibrillation: Secondary | ICD-10-CM | POA: Diagnosis not present

## 2019-12-20 DIAGNOSIS — Z471 Aftercare following joint replacement surgery: Secondary | ICD-10-CM | POA: Diagnosis not present

## 2019-12-20 DIAGNOSIS — R3129 Other microscopic hematuria: Secondary | ICD-10-CM | POA: Diagnosis not present

## 2019-12-20 DIAGNOSIS — F329 Major depressive disorder, single episode, unspecified: Secondary | ICD-10-CM | POA: Diagnosis not present

## 2019-12-20 DIAGNOSIS — I509 Heart failure, unspecified: Secondary | ICD-10-CM | POA: Diagnosis not present

## 2019-12-20 DIAGNOSIS — E559 Vitamin D deficiency, unspecified: Secondary | ICD-10-CM | POA: Diagnosis not present

## 2019-12-20 DIAGNOSIS — M0609 Rheumatoid arthritis without rheumatoid factor, multiple sites: Secondary | ICD-10-CM | POA: Diagnosis not present

## 2019-12-20 DIAGNOSIS — E876 Hypokalemia: Secondary | ICD-10-CM | POA: Diagnosis not present

## 2019-12-20 DIAGNOSIS — Z7901 Long term (current) use of anticoagulants: Secondary | ICD-10-CM | POA: Diagnosis not present

## 2019-12-20 DIAGNOSIS — N182 Chronic kidney disease, stage 2 (mild): Secondary | ICD-10-CM | POA: Diagnosis not present

## 2019-12-20 DIAGNOSIS — M1990 Unspecified osteoarthritis, unspecified site: Secondary | ICD-10-CM | POA: Diagnosis not present

## 2019-12-20 DIAGNOSIS — K2100 Gastro-esophageal reflux disease with esophagitis, without bleeding: Secondary | ICD-10-CM | POA: Diagnosis not present

## 2019-12-20 DIAGNOSIS — J439 Emphysema, unspecified: Secondary | ICD-10-CM | POA: Diagnosis not present

## 2019-12-20 DIAGNOSIS — Z96651 Presence of right artificial knee joint: Secondary | ICD-10-CM | POA: Diagnosis not present

## 2019-12-20 DIAGNOSIS — N529 Male erectile dysfunction, unspecified: Secondary | ICD-10-CM | POA: Diagnosis not present

## 2019-12-20 DIAGNOSIS — I441 Atrioventricular block, second degree: Secondary | ICD-10-CM | POA: Diagnosis not present

## 2019-12-20 DIAGNOSIS — H5704 Mydriasis: Secondary | ICD-10-CM | POA: Diagnosis not present

## 2019-12-20 DIAGNOSIS — J9601 Acute respiratory failure with hypoxia: Secondary | ICD-10-CM | POA: Diagnosis not present

## 2019-12-20 DIAGNOSIS — E890 Postprocedural hypothyroidism: Secondary | ICD-10-CM | POA: Diagnosis not present

## 2019-12-20 DIAGNOSIS — M1712 Unilateral primary osteoarthritis, left knee: Secondary | ICD-10-CM | POA: Diagnosis not present

## 2019-12-20 DIAGNOSIS — R8290 Unspecified abnormal findings in urine: Secondary | ICD-10-CM | POA: Diagnosis not present

## 2019-12-20 DIAGNOSIS — A4901 Methicillin susceptible Staphylococcus aureus infection, unspecified site: Secondary | ICD-10-CM | POA: Diagnosis not present

## 2019-12-20 DIAGNOSIS — U071 COVID-19: Secondary | ICD-10-CM | POA: Diagnosis not present

## 2019-12-20 DIAGNOSIS — H9190 Unspecified hearing loss, unspecified ear: Secondary | ICD-10-CM | POA: Diagnosis not present

## 2019-12-20 DIAGNOSIS — M531 Cervicobrachial syndrome: Secondary | ICD-10-CM | POA: Diagnosis not present

## 2019-12-20 DIAGNOSIS — M545 Low back pain: Secondary | ICD-10-CM | POA: Diagnosis not present

## 2019-12-20 DIAGNOSIS — R269 Unspecified abnormalities of gait and mobility: Secondary | ICD-10-CM | POA: Diagnosis not present

## 2019-12-20 DIAGNOSIS — C9110 Chronic lymphocytic leukemia of B-cell type not having achieved remission: Secondary | ICD-10-CM | POA: Diagnosis not present

## 2019-12-20 DIAGNOSIS — J1282 Pneumonia due to coronavirus disease 2019: Secondary | ICD-10-CM | POA: Diagnosis not present

## 2019-12-20 DIAGNOSIS — E7800 Pure hypercholesterolemia, unspecified: Secondary | ICD-10-CM | POA: Diagnosis not present

## 2019-12-20 DIAGNOSIS — Z79899 Other long term (current) drug therapy: Secondary | ICD-10-CM | POA: Diagnosis not present

## 2019-12-20 DIAGNOSIS — N183 Chronic kidney disease, stage 3 unspecified: Secondary | ICD-10-CM | POA: Diagnosis not present

## 2019-12-20 DIAGNOSIS — Z466 Encounter for fitting and adjustment of urinary device: Secondary | ICD-10-CM | POA: Diagnosis not present

## 2019-12-20 DIAGNOSIS — K5901 Slow transit constipation: Secondary | ICD-10-CM | POA: Diagnosis not present

## 2019-12-20 DIAGNOSIS — S31821D Laceration without foreign body of left buttock, subsequent encounter: Secondary | ICD-10-CM | POA: Diagnosis not present

## 2019-12-20 DIAGNOSIS — C787 Secondary malignant neoplasm of liver and intrahepatic bile duct: Secondary | ICD-10-CM | POA: Diagnosis not present

## 2019-12-20 DIAGNOSIS — M150 Primary generalized (osteo)arthritis: Secondary | ICD-10-CM | POA: Diagnosis not present

## 2019-12-20 DIAGNOSIS — I5022 Chronic systolic (congestive) heart failure: Secondary | ICD-10-CM | POA: Diagnosis not present

## 2019-12-20 DIAGNOSIS — Z8546 Personal history of malignant neoplasm of prostate: Secondary | ICD-10-CM | POA: Diagnosis not present

## 2019-12-20 DIAGNOSIS — I639 Cerebral infarction, unspecified: Secondary | ICD-10-CM | POA: Diagnosis not present

## 2019-12-20 DIAGNOSIS — D3000 Benign neoplasm of unspecified kidney: Secondary | ICD-10-CM | POA: Diagnosis not present

## 2019-12-20 DIAGNOSIS — K5732 Diverticulitis of large intestine without perforation or abscess without bleeding: Secondary | ICD-10-CM | POA: Diagnosis not present

## 2019-12-20 DIAGNOSIS — Z9861 Coronary angioplasty status: Secondary | ICD-10-CM | POA: Diagnosis not present

## 2019-12-20 DIAGNOSIS — Z6826 Body mass index (BMI) 26.0-26.9, adult: Secondary | ICD-10-CM | POA: Diagnosis not present

## 2019-12-20 DIAGNOSIS — I739 Peripheral vascular disease, unspecified: Secondary | ICD-10-CM | POA: Diagnosis not present

## 2019-12-20 DIAGNOSIS — G9009 Other idiopathic peripheral autonomic neuropathy: Secondary | ICD-10-CM | POA: Diagnosis not present

## 2019-12-20 DIAGNOSIS — I2119 ST elevation (STEMI) myocardial infarction involving other coronary artery of inferior wall: Secondary | ICD-10-CM | POA: Diagnosis not present

## 2019-12-20 DIAGNOSIS — E118 Type 2 diabetes mellitus with unspecified complications: Secondary | ICD-10-CM | POA: Diagnosis not present

## 2019-12-20 DIAGNOSIS — I083 Combined rheumatic disorders of mitral, aortic and tricuspid valves: Secondary | ICD-10-CM | POA: Diagnosis not present

## 2019-12-20 DIAGNOSIS — E039 Hypothyroidism, unspecified: Secondary | ICD-10-CM | POA: Diagnosis not present

## 2019-12-20 DIAGNOSIS — C189 Malignant neoplasm of colon, unspecified: Secondary | ICD-10-CM | POA: Diagnosis not present

## 2019-12-20 DIAGNOSIS — K859 Acute pancreatitis without necrosis or infection, unspecified: Secondary | ICD-10-CM | POA: Diagnosis not present

## 2019-12-20 DIAGNOSIS — I255 Ischemic cardiomyopathy: Secondary | ICD-10-CM | POA: Diagnosis not present

## 2019-12-20 DIAGNOSIS — M47812 Spondylosis without myelopathy or radiculopathy, cervical region: Secondary | ICD-10-CM | POA: Diagnosis not present

## 2019-12-20 DIAGNOSIS — R0902 Hypoxemia: Secondary | ICD-10-CM | POA: Diagnosis not present

## 2019-12-20 DIAGNOSIS — K769 Liver disease, unspecified: Secondary | ICD-10-CM | POA: Diagnosis not present

## 2019-12-20 DIAGNOSIS — Z79891 Long term (current) use of opiate analgesic: Secondary | ICD-10-CM | POA: Diagnosis not present

## 2019-12-21 DIAGNOSIS — Z741 Need for assistance with personal care: Secondary | ICD-10-CM | POA: Diagnosis not present

## 2019-12-21 DIAGNOSIS — R519 Headache, unspecified: Secondary | ICD-10-CM | POA: Diagnosis not present

## 2019-12-21 DIAGNOSIS — J209 Acute bronchitis, unspecified: Secondary | ICD-10-CM | POA: Diagnosis not present

## 2019-12-21 DIAGNOSIS — I272 Pulmonary hypertension, unspecified: Secondary | ICD-10-CM | POA: Diagnosis not present

## 2019-12-21 DIAGNOSIS — E538 Deficiency of other specified B group vitamins: Secondary | ICD-10-CM | POA: Diagnosis not present

## 2019-12-21 DIAGNOSIS — D487 Neoplasm of uncertain behavior of other specified sites: Secondary | ICD-10-CM | POA: Diagnosis not present

## 2019-12-21 DIAGNOSIS — G4700 Insomnia, unspecified: Secondary | ICD-10-CM | POA: Diagnosis not present

## 2019-12-21 DIAGNOSIS — R413 Other amnesia: Secondary | ICD-10-CM | POA: Diagnosis not present

## 2019-12-21 DIAGNOSIS — E7849 Other hyperlipidemia: Secondary | ICD-10-CM | POA: Diagnosis not present

## 2019-12-21 DIAGNOSIS — I455 Other specified heart block: Secondary | ICD-10-CM | POA: Diagnosis not present

## 2019-12-21 DIAGNOSIS — R0600 Dyspnea, unspecified: Secondary | ICD-10-CM | POA: Diagnosis not present

## 2019-12-21 DIAGNOSIS — M792 Neuralgia and neuritis, unspecified: Secondary | ICD-10-CM | POA: Diagnosis not present

## 2019-12-21 DIAGNOSIS — D500 Iron deficiency anemia secondary to blood loss (chronic): Secondary | ICD-10-CM | POA: Diagnosis not present

## 2019-12-21 DIAGNOSIS — E079 Disorder of thyroid, unspecified: Secondary | ICD-10-CM | POA: Diagnosis not present

## 2019-12-21 DIAGNOSIS — G9341 Metabolic encephalopathy: Secondary | ICD-10-CM | POA: Diagnosis not present

## 2019-12-21 DIAGNOSIS — R5383 Other fatigue: Secondary | ICD-10-CM | POA: Diagnosis not present

## 2019-12-21 DIAGNOSIS — L409 Psoriasis, unspecified: Secondary | ICD-10-CM | POA: Diagnosis not present

## 2019-12-21 DIAGNOSIS — Z853 Personal history of malignant neoplasm of breast: Secondary | ICD-10-CM | POA: Diagnosis not present

## 2019-12-21 DIAGNOSIS — R350 Frequency of micturition: Secondary | ICD-10-CM | POA: Diagnosis not present

## 2019-12-21 DIAGNOSIS — S91101S Unspecified open wound of right great toe without damage to nail, sequela: Secondary | ICD-10-CM | POA: Diagnosis not present

## 2019-12-21 DIAGNOSIS — M170 Bilateral primary osteoarthritis of knee: Secondary | ICD-10-CM | POA: Diagnosis not present

## 2019-12-21 DIAGNOSIS — Z125 Encounter for screening for malignant neoplasm of prostate: Secondary | ICD-10-CM | POA: Diagnosis not present

## 2019-12-21 DIAGNOSIS — E875 Hyperkalemia: Secondary | ICD-10-CM | POA: Diagnosis not present

## 2019-12-21 DIAGNOSIS — M1A00X0 Idiopathic chronic gout, unspecified site, without tophus (tophi): Secondary | ICD-10-CM | POA: Diagnosis not present

## 2019-12-21 DIAGNOSIS — Z0289 Encounter for other administrative examinations: Secondary | ICD-10-CM | POA: Diagnosis not present

## 2019-12-21 DIAGNOSIS — F419 Anxiety disorder, unspecified: Secondary | ICD-10-CM | POA: Diagnosis not present

## 2019-12-21 DIAGNOSIS — I071 Rheumatic tricuspid insufficiency: Secondary | ICD-10-CM | POA: Diagnosis not present

## 2019-12-21 DIAGNOSIS — R42 Dizziness and giddiness: Secondary | ICD-10-CM | POA: Diagnosis not present

## 2019-12-21 DIAGNOSIS — E1169 Type 2 diabetes mellitus with other specified complication: Secondary | ICD-10-CM | POA: Diagnosis not present

## 2019-12-21 DIAGNOSIS — B965 Pseudomonas (aeruginosa) (mallei) (pseudomallei) as the cause of diseases classified elsewhere: Secondary | ICD-10-CM | POA: Diagnosis not present

## 2019-12-21 DIAGNOSIS — I509 Heart failure, unspecified: Secondary | ICD-10-CM | POA: Diagnosis not present

## 2019-12-21 DIAGNOSIS — R7302 Impaired glucose tolerance (oral): Secondary | ICD-10-CM | POA: Diagnosis not present

## 2019-12-21 DIAGNOSIS — R590 Localized enlarged lymph nodes: Secondary | ICD-10-CM | POA: Diagnosis not present

## 2019-12-21 DIAGNOSIS — Z87440 Personal history of urinary (tract) infections: Secondary | ICD-10-CM | POA: Diagnosis not present

## 2019-12-21 DIAGNOSIS — R300 Dysuria: Secondary | ICD-10-CM | POA: Diagnosis not present

## 2019-12-21 DIAGNOSIS — I4891 Unspecified atrial fibrillation: Secondary | ICD-10-CM | POA: Diagnosis not present

## 2019-12-21 DIAGNOSIS — R311 Benign essential microscopic hematuria: Secondary | ICD-10-CM | POA: Diagnosis not present

## 2019-12-21 DIAGNOSIS — I671 Cerebral aneurysm, nonruptured: Secondary | ICD-10-CM | POA: Diagnosis not present

## 2019-12-21 DIAGNOSIS — R1110 Vomiting, unspecified: Secondary | ICD-10-CM | POA: Diagnosis not present

## 2019-12-21 DIAGNOSIS — N182 Chronic kidney disease, stage 2 (mild): Secondary | ICD-10-CM | POA: Diagnosis not present

## 2019-12-21 DIAGNOSIS — E782 Mixed hyperlipidemia: Secondary | ICD-10-CM | POA: Diagnosis not present

## 2019-12-21 DIAGNOSIS — N2581 Secondary hyperparathyroidism of renal origin: Secondary | ICD-10-CM | POA: Diagnosis not present

## 2019-12-21 DIAGNOSIS — R5382 Chronic fatigue, unspecified: Secondary | ICD-10-CM | POA: Diagnosis not present

## 2019-12-21 DIAGNOSIS — M25631 Stiffness of right wrist, not elsewhere classified: Secondary | ICD-10-CM | POA: Diagnosis not present

## 2019-12-21 DIAGNOSIS — Z20828 Contact with and (suspected) exposure to other viral communicable diseases: Secondary | ICD-10-CM | POA: Diagnosis not present

## 2019-12-21 DIAGNOSIS — Z1331 Encounter for screening for depression: Secondary | ICD-10-CM | POA: Diagnosis not present

## 2019-12-21 DIAGNOSIS — M75121 Complete rotator cuff tear or rupture of right shoulder, not specified as traumatic: Secondary | ICD-10-CM | POA: Diagnosis not present

## 2019-12-21 DIAGNOSIS — R931 Abnormal findings on diagnostic imaging of heart and coronary circulation: Secondary | ICD-10-CM | POA: Diagnosis not present

## 2019-12-21 DIAGNOSIS — J1282 Pneumonia due to coronavirus disease 2019: Secondary | ICD-10-CM | POA: Diagnosis not present

## 2019-12-21 DIAGNOSIS — E663 Overweight: Secondary | ICD-10-CM | POA: Diagnosis not present

## 2019-12-21 DIAGNOSIS — M79602 Pain in left arm: Secondary | ICD-10-CM | POA: Diagnosis not present

## 2019-12-21 DIAGNOSIS — R310 Gross hematuria: Secondary | ICD-10-CM | POA: Diagnosis not present

## 2019-12-21 DIAGNOSIS — I1310 Hypertensive heart and chronic kidney disease without heart failure, with stage 1 through stage 4 chronic kidney disease, or unspecified chronic kidney disease: Secondary | ICD-10-CM | POA: Diagnosis not present

## 2019-12-21 DIAGNOSIS — N189 Chronic kidney disease, unspecified: Secondary | ICD-10-CM | POA: Diagnosis not present

## 2019-12-21 DIAGNOSIS — Z01812 Encounter for preprocedural laboratory examination: Secondary | ICD-10-CM | POA: Diagnosis not present

## 2019-12-21 DIAGNOSIS — D519 Vitamin B12 deficiency anemia, unspecified: Secondary | ICD-10-CM | POA: Diagnosis not present

## 2019-12-21 DIAGNOSIS — J9601 Acute respiratory failure with hypoxia: Secondary | ICD-10-CM | POA: Diagnosis not present

## 2019-12-21 DIAGNOSIS — J45998 Other asthma: Secondary | ICD-10-CM | POA: Diagnosis not present

## 2019-12-21 DIAGNOSIS — G7289 Other specified myopathies: Secondary | ICD-10-CM | POA: Diagnosis not present

## 2019-12-21 DIAGNOSIS — I110 Hypertensive heart disease with heart failure: Secondary | ICD-10-CM | POA: Diagnosis not present

## 2019-12-21 DIAGNOSIS — N184 Chronic kidney disease, stage 4 (severe): Secondary | ICD-10-CM | POA: Diagnosis not present

## 2019-12-21 DIAGNOSIS — R399 Unspecified symptoms and signs involving the genitourinary system: Secondary | ICD-10-CM | POA: Diagnosis not present

## 2019-12-21 DIAGNOSIS — E1122 Type 2 diabetes mellitus with diabetic chronic kidney disease: Secondary | ICD-10-CM | POA: Diagnosis not present

## 2019-12-21 DIAGNOSIS — E039 Hypothyroidism, unspecified: Secondary | ICD-10-CM | POA: Diagnosis not present

## 2019-12-21 DIAGNOSIS — Z7902 Long term (current) use of antithrombotics/antiplatelets: Secondary | ICD-10-CM | POA: Diagnosis not present

## 2019-12-21 DIAGNOSIS — I4819 Other persistent atrial fibrillation: Secondary | ICD-10-CM | POA: Diagnosis not present

## 2019-12-21 DIAGNOSIS — M1990 Unspecified osteoarthritis, unspecified site: Secondary | ICD-10-CM | POA: Diagnosis not present

## 2019-12-21 DIAGNOSIS — E669 Obesity, unspecified: Secondary | ICD-10-CM | POA: Diagnosis not present

## 2019-12-21 DIAGNOSIS — F330 Major depressive disorder, recurrent, mild: Secondary | ICD-10-CM | POA: Diagnosis not present

## 2019-12-21 DIAGNOSIS — R3915 Urgency of urination: Secondary | ICD-10-CM | POA: Diagnosis not present

## 2019-12-21 DIAGNOSIS — F09 Unspecified mental disorder due to known physiological condition: Secondary | ICD-10-CM | POA: Diagnosis not present

## 2019-12-21 DIAGNOSIS — Z992 Dependence on renal dialysis: Secondary | ICD-10-CM | POA: Diagnosis not present

## 2019-12-21 DIAGNOSIS — M545 Low back pain: Secondary | ICD-10-CM | POA: Diagnosis not present

## 2019-12-21 DIAGNOSIS — Z87898 Personal history of other specified conditions: Secondary | ICD-10-CM | POA: Diagnosis not present

## 2019-12-21 DIAGNOSIS — Z20822 Contact with and (suspected) exposure to covid-19: Secondary | ICD-10-CM | POA: Diagnosis not present

## 2019-12-21 DIAGNOSIS — J302 Other seasonal allergic rhinitis: Secondary | ICD-10-CM | POA: Diagnosis not present

## 2019-12-21 DIAGNOSIS — D539 Nutritional anemia, unspecified: Secondary | ICD-10-CM | POA: Diagnosis not present

## 2019-12-21 DIAGNOSIS — Z993 Dependence on wheelchair: Secondary | ICD-10-CM | POA: Diagnosis not present

## 2019-12-21 DIAGNOSIS — M0589 Other rheumatoid arthritis with rheumatoid factor of multiple sites: Secondary | ICD-10-CM | POA: Diagnosis not present

## 2019-12-21 DIAGNOSIS — S42293D Other displaced fracture of upper end of unspecified humerus, subsequent encounter for fracture with routine healing: Secondary | ICD-10-CM | POA: Diagnosis not present

## 2019-12-21 DIAGNOSIS — E559 Vitamin D deficiency, unspecified: Secondary | ICD-10-CM | POA: Diagnosis not present

## 2019-12-21 DIAGNOSIS — R011 Cardiac murmur, unspecified: Secondary | ICD-10-CM | POA: Diagnosis not present

## 2019-12-21 DIAGNOSIS — C7911 Secondary malignant neoplasm of bladder: Secondary | ICD-10-CM | POA: Diagnosis not present

## 2019-12-21 DIAGNOSIS — I480 Paroxysmal atrial fibrillation: Secondary | ICD-10-CM | POA: Diagnosis not present

## 2019-12-21 DIAGNOSIS — Z1152 Encounter for screening for COVID-19: Secondary | ICD-10-CM | POA: Diagnosis not present

## 2019-12-21 DIAGNOSIS — M0579 Rheumatoid arthritis with rheumatoid factor of multiple sites without organ or systems involvement: Secondary | ICD-10-CM | POA: Diagnosis not present

## 2019-12-21 DIAGNOSIS — H04129 Dry eye syndrome of unspecified lacrimal gland: Secondary | ICD-10-CM | POA: Diagnosis not present

## 2019-12-21 DIAGNOSIS — Z8639 Personal history of other endocrine, nutritional and metabolic disease: Secondary | ICD-10-CM | POA: Diagnosis not present

## 2019-12-21 DIAGNOSIS — Z2089 Contact with and (suspected) exposure to other communicable diseases: Secondary | ICD-10-CM | POA: Diagnosis not present

## 2019-12-21 DIAGNOSIS — I739 Peripheral vascular disease, unspecified: Secondary | ICD-10-CM | POA: Diagnosis not present

## 2019-12-21 DIAGNOSIS — Z1159 Encounter for screening for other viral diseases: Secondary | ICD-10-CM | POA: Diagnosis not present

## 2019-12-21 DIAGNOSIS — N3941 Urge incontinence: Secondary | ICD-10-CM | POA: Diagnosis not present

## 2019-12-21 DIAGNOSIS — I2729 Other secondary pulmonary hypertension: Secondary | ICD-10-CM | POA: Diagnosis not present

## 2019-12-21 DIAGNOSIS — Z96653 Presence of artificial knee joint, bilateral: Secondary | ICD-10-CM | POA: Diagnosis not present

## 2019-12-21 DIAGNOSIS — R293 Abnormal posture: Secondary | ICD-10-CM | POA: Diagnosis not present

## 2019-12-21 DIAGNOSIS — Z6827 Body mass index (BMI) 27.0-27.9, adult: Secondary | ICD-10-CM | POA: Diagnosis not present

## 2019-12-21 DIAGNOSIS — G4733 Obstructive sleep apnea (adult) (pediatric): Secondary | ICD-10-CM | POA: Diagnosis not present

## 2019-12-21 DIAGNOSIS — E1151 Type 2 diabetes mellitus with diabetic peripheral angiopathy without gangrene: Secondary | ICD-10-CM | POA: Diagnosis not present

## 2019-12-21 DIAGNOSIS — N183 Chronic kidney disease, stage 3 unspecified: Secondary | ICD-10-CM | POA: Diagnosis not present

## 2019-12-21 DIAGNOSIS — I700 Atherosclerosis of aorta: Secondary | ICD-10-CM | POA: Diagnosis not present

## 2019-12-21 DIAGNOSIS — N1832 Chronic kidney disease, stage 3b: Secondary | ICD-10-CM | POA: Diagnosis not present

## 2019-12-21 DIAGNOSIS — E1149 Type 2 diabetes mellitus with other diabetic neurological complication: Secondary | ICD-10-CM | POA: Diagnosis not present

## 2019-12-21 DIAGNOSIS — F322 Major depressive disorder, single episode, severe without psychotic features: Secondary | ICD-10-CM | POA: Diagnosis not present

## 2019-12-21 DIAGNOSIS — Z7952 Long term (current) use of systemic steroids: Secondary | ICD-10-CM | POA: Diagnosis not present

## 2019-12-21 DIAGNOSIS — R51 Headache: Secondary | ICD-10-CM | POA: Diagnosis not present

## 2019-12-21 DIAGNOSIS — T8249XA Other complication of vascular dialysis catheter, initial encounter: Secondary | ICD-10-CM | POA: Diagnosis not present

## 2019-12-21 DIAGNOSIS — E860 Dehydration: Secondary | ICD-10-CM | POA: Diagnosis not present

## 2019-12-21 DIAGNOSIS — R41841 Cognitive communication deficit: Secondary | ICD-10-CM | POA: Diagnosis not present

## 2019-12-21 DIAGNOSIS — R4701 Aphasia: Secondary | ICD-10-CM | POA: Diagnosis not present

## 2019-12-21 DIAGNOSIS — R7303 Prediabetes: Secondary | ICD-10-CM | POA: Diagnosis not present

## 2019-12-21 DIAGNOSIS — J9691 Respiratory failure, unspecified with hypoxia: Secondary | ICD-10-CM | POA: Diagnosis not present

## 2019-12-21 DIAGNOSIS — Z9049 Acquired absence of other specified parts of digestive tract: Secondary | ICD-10-CM | POA: Diagnosis not present

## 2019-12-21 DIAGNOSIS — N186 End stage renal disease: Secondary | ICD-10-CM | POA: Diagnosis not present

## 2019-12-21 DIAGNOSIS — N390 Urinary tract infection, site not specified: Secondary | ICD-10-CM | POA: Diagnosis not present

## 2019-12-21 DIAGNOSIS — N3281 Overactive bladder: Secondary | ICD-10-CM | POA: Diagnosis not present

## 2019-12-21 DIAGNOSIS — K219 Gastro-esophageal reflux disease without esophagitis: Secondary | ICD-10-CM | POA: Diagnosis not present

## 2019-12-21 DIAGNOSIS — Z452 Encounter for adjustment and management of vascular access device: Secondary | ICD-10-CM | POA: Diagnosis not present

## 2019-12-21 DIAGNOSIS — D751 Secondary polycythemia: Secondary | ICD-10-CM | POA: Diagnosis not present

## 2019-12-21 DIAGNOSIS — I5020 Unspecified systolic (congestive) heart failure: Secondary | ICD-10-CM | POA: Diagnosis not present

## 2019-12-21 DIAGNOSIS — K5900 Constipation, unspecified: Secondary | ICD-10-CM | POA: Diagnosis not present

## 2019-12-21 DIAGNOSIS — Z4781 Encounter for orthopedic aftercare following surgical amputation: Secondary | ICD-10-CM | POA: Diagnosis not present

## 2019-12-21 DIAGNOSIS — Z0000 Encounter for general adult medical examination without abnormal findings: Secondary | ICD-10-CM | POA: Diagnosis not present

## 2019-12-21 DIAGNOSIS — Z0001 Encounter for general adult medical examination with abnormal findings: Secondary | ICD-10-CM | POA: Diagnosis not present

## 2019-12-21 DIAGNOSIS — R609 Edema, unspecified: Secondary | ICD-10-CM | POA: Diagnosis not present

## 2019-12-21 DIAGNOSIS — R1032 Left lower quadrant pain: Secondary | ICD-10-CM | POA: Diagnosis not present

## 2019-12-21 DIAGNOSIS — Z825 Family history of asthma and other chronic lower respiratory diseases: Secondary | ICD-10-CM | POA: Diagnosis not present

## 2019-12-21 DIAGNOSIS — I214 Non-ST elevation (NSTEMI) myocardial infarction: Secondary | ICD-10-CM | POA: Diagnosis not present

## 2019-12-21 DIAGNOSIS — R110 Nausea: Secondary | ICD-10-CM | POA: Diagnosis not present

## 2019-12-21 DIAGNOSIS — K449 Diaphragmatic hernia without obstruction or gangrene: Secondary | ICD-10-CM | POA: Diagnosis not present

## 2019-12-21 DIAGNOSIS — R05 Cough: Secondary | ICD-10-CM | POA: Diagnosis not present

## 2019-12-21 DIAGNOSIS — R2681 Unsteadiness on feet: Secondary | ICD-10-CM | POA: Diagnosis not present

## 2019-12-21 DIAGNOSIS — M6281 Muscle weakness (generalized): Secondary | ICD-10-CM | POA: Diagnosis not present

## 2019-12-21 DIAGNOSIS — Z7951 Long term (current) use of inhaled steroids: Secondary | ICD-10-CM | POA: Diagnosis not present

## 2019-12-21 DIAGNOSIS — R1310 Dysphagia, unspecified: Secondary | ICD-10-CM | POA: Diagnosis not present

## 2019-12-21 DIAGNOSIS — Z96652 Presence of left artificial knee joint: Secondary | ICD-10-CM | POA: Diagnosis not present

## 2019-12-21 DIAGNOSIS — E871 Hypo-osmolality and hyponatremia: Secondary | ICD-10-CM | POA: Diagnosis not present

## 2019-12-21 DIAGNOSIS — N3001 Acute cystitis with hematuria: Secondary | ICD-10-CM | POA: Diagnosis not present

## 2019-12-21 DIAGNOSIS — K2100 Gastro-esophageal reflux disease with esophagitis, without bleeding: Secondary | ICD-10-CM | POA: Diagnosis not present

## 2019-12-21 DIAGNOSIS — D6489 Other specified anemias: Secondary | ICD-10-CM | POA: Diagnosis not present

## 2019-12-21 DIAGNOSIS — Z89612 Acquired absence of left leg above knee: Secondary | ICD-10-CM | POA: Diagnosis not present

## 2019-12-21 DIAGNOSIS — F1010 Alcohol abuse, uncomplicated: Secondary | ICD-10-CM | POA: Diagnosis not present

## 2019-12-21 DIAGNOSIS — E119 Type 2 diabetes mellitus without complications: Secondary | ICD-10-CM | POA: Diagnosis not present

## 2019-12-21 DIAGNOSIS — Z800 Family history of malignant neoplasm of digestive organs: Secondary | ICD-10-CM | POA: Diagnosis not present

## 2019-12-21 DIAGNOSIS — R634 Abnormal weight loss: Secondary | ICD-10-CM | POA: Diagnosis not present

## 2019-12-21 DIAGNOSIS — C61 Malignant neoplasm of prostate: Secondary | ICD-10-CM | POA: Diagnosis not present

## 2019-12-21 DIAGNOSIS — Z7982 Long term (current) use of aspirin: Secondary | ICD-10-CM | POA: Diagnosis not present

## 2019-12-21 DIAGNOSIS — Z79891 Long term (current) use of opiate analgesic: Secondary | ICD-10-CM | POA: Diagnosis not present

## 2019-12-21 DIAGNOSIS — I5033 Acute on chronic diastolic (congestive) heart failure: Secondary | ICD-10-CM | POA: Diagnosis not present

## 2019-12-21 DIAGNOSIS — N401 Enlarged prostate with lower urinary tract symptoms: Secondary | ICD-10-CM | POA: Diagnosis not present

## 2019-12-21 DIAGNOSIS — Z950 Presence of cardiac pacemaker: Secondary | ICD-10-CM | POA: Diagnosis not present

## 2019-12-21 DIAGNOSIS — H9319 Tinnitus, unspecified ear: Secondary | ICD-10-CM | POA: Diagnosis not present

## 2019-12-21 DIAGNOSIS — G8929 Other chronic pain: Secondary | ICD-10-CM | POA: Diagnosis not present

## 2019-12-21 DIAGNOSIS — M25512 Pain in left shoulder: Secondary | ICD-10-CM | POA: Diagnosis not present

## 2019-12-21 DIAGNOSIS — G2581 Restless legs syndrome: Secondary | ICD-10-CM | POA: Diagnosis not present

## 2019-12-21 DIAGNOSIS — N429 Disorder of prostate, unspecified: Secondary | ICD-10-CM | POA: Diagnosis not present

## 2019-12-21 DIAGNOSIS — E1140 Type 2 diabetes mellitus with diabetic neuropathy, unspecified: Secondary | ICD-10-CM | POA: Diagnosis not present

## 2019-12-21 DIAGNOSIS — R7989 Other specified abnormal findings of blood chemistry: Secondary | ICD-10-CM | POA: Diagnosis not present

## 2019-12-21 DIAGNOSIS — M353 Polymyalgia rheumatica: Secondary | ICD-10-CM | POA: Diagnosis not present

## 2019-12-21 DIAGNOSIS — R918 Other nonspecific abnormal finding of lung field: Secondary | ICD-10-CM | POA: Diagnosis not present

## 2019-12-21 DIAGNOSIS — G35 Multiple sclerosis: Secondary | ICD-10-CM | POA: Diagnosis not present

## 2019-12-21 DIAGNOSIS — Z79899 Other long term (current) drug therapy: Secondary | ICD-10-CM | POA: Diagnosis not present

## 2019-12-21 DIAGNOSIS — M818 Other osteoporosis without current pathological fracture: Secondary | ICD-10-CM | POA: Diagnosis not present

## 2019-12-21 DIAGNOSIS — I50810 Right heart failure, unspecified: Secondary | ICD-10-CM | POA: Diagnosis not present

## 2019-12-21 DIAGNOSIS — Z1211 Encounter for screening for malignant neoplasm of colon: Secondary | ICD-10-CM | POA: Diagnosis not present

## 2019-12-21 DIAGNOSIS — Z23 Encounter for immunization: Secondary | ICD-10-CM | POA: Diagnosis not present

## 2019-12-21 DIAGNOSIS — Z8673 Personal history of transient ischemic attack (TIA), and cerebral infarction without residual deficits: Secondary | ICD-10-CM | POA: Diagnosis not present

## 2019-12-21 DIAGNOSIS — M4310 Spondylolisthesis, site unspecified: Secondary | ICD-10-CM | POA: Diagnosis not present

## 2019-12-21 DIAGNOSIS — M25611 Stiffness of right shoulder, not elsewhere classified: Secondary | ICD-10-CM | POA: Diagnosis not present

## 2019-12-21 DIAGNOSIS — Z7189 Other specified counseling: Secondary | ICD-10-CM | POA: Diagnosis not present

## 2019-12-21 DIAGNOSIS — Z9981 Dependence on supplemental oxygen: Secondary | ICD-10-CM | POA: Diagnosis not present

## 2019-12-21 DIAGNOSIS — F1021 Alcohol dependence, in remission: Secondary | ICD-10-CM | POA: Diagnosis not present

## 2019-12-21 DIAGNOSIS — I252 Old myocardial infarction: Secondary | ICD-10-CM | POA: Diagnosis not present

## 2019-12-21 DIAGNOSIS — E1165 Type 2 diabetes mellitus with hyperglycemia: Secondary | ICD-10-CM | POA: Diagnosis not present

## 2019-12-21 DIAGNOSIS — R262 Difficulty in walking, not elsewhere classified: Secondary | ICD-10-CM | POA: Diagnosis not present

## 2019-12-21 DIAGNOSIS — R7301 Impaired fasting glucose: Secondary | ICD-10-CM | POA: Diagnosis not present

## 2019-12-21 DIAGNOSIS — D509 Iron deficiency anemia, unspecified: Secondary | ICD-10-CM | POA: Diagnosis not present

## 2019-12-21 DIAGNOSIS — Z8249 Family history of ischemic heart disease and other diseases of the circulatory system: Secondary | ICD-10-CM | POA: Diagnosis not present

## 2019-12-21 DIAGNOSIS — R000 Tachycardia, unspecified: Secondary | ICD-10-CM | POA: Diagnosis not present

## 2019-12-21 DIAGNOSIS — R944 Abnormal results of kidney function studies: Secondary | ICD-10-CM | POA: Diagnosis not present

## 2019-12-21 DIAGNOSIS — Z792 Long term (current) use of antibiotics: Secondary | ICD-10-CM | POA: Diagnosis not present

## 2019-12-21 DIAGNOSIS — Z87891 Personal history of nicotine dependence: Secondary | ICD-10-CM | POA: Diagnosis not present

## 2019-12-21 DIAGNOSIS — G8221 Paraplegia, complete: Secondary | ICD-10-CM | POA: Diagnosis not present

## 2019-12-21 DIAGNOSIS — E7800 Pure hypercholesterolemia, unspecified: Secondary | ICD-10-CM | POA: Diagnosis not present

## 2019-12-21 DIAGNOSIS — I10 Essential (primary) hypertension: Secondary | ICD-10-CM | POA: Diagnosis not present

## 2019-12-21 DIAGNOSIS — Z9181 History of falling: Secondary | ICD-10-CM | POA: Diagnosis not present

## 2019-12-21 DIAGNOSIS — F4310 Post-traumatic stress disorder, unspecified: Secondary | ICD-10-CM | POA: Diagnosis not present

## 2019-12-21 DIAGNOSIS — C50912 Malignant neoplasm of unspecified site of left female breast: Secondary | ICD-10-CM | POA: Diagnosis not present

## 2019-12-21 DIAGNOSIS — K529 Noninfective gastroenteritis and colitis, unspecified: Secondary | ICD-10-CM | POA: Diagnosis not present

## 2019-12-21 DIAGNOSIS — R278 Other lack of coordination: Secondary | ICD-10-CM | POA: Diagnosis not present

## 2019-12-21 DIAGNOSIS — E876 Hypokalemia: Secondary | ICD-10-CM | POA: Diagnosis not present

## 2019-12-21 DIAGNOSIS — F329 Major depressive disorder, single episode, unspecified: Secondary | ICD-10-CM | POA: Diagnosis not present

## 2019-12-21 DIAGNOSIS — I25119 Atherosclerotic heart disease of native coronary artery with unspecified angina pectoris: Secondary | ICD-10-CM | POA: Diagnosis not present

## 2019-12-21 DIAGNOSIS — Z5181 Encounter for therapeutic drug level monitoring: Secondary | ICD-10-CM | POA: Diagnosis not present

## 2019-12-21 DIAGNOSIS — E785 Hyperlipidemia, unspecified: Secondary | ICD-10-CM | POA: Diagnosis not present

## 2019-12-21 DIAGNOSIS — I495 Sick sinus syndrome: Secondary | ICD-10-CM | POA: Diagnosis not present

## 2019-12-21 DIAGNOSIS — R201 Hypoesthesia of skin: Secondary | ICD-10-CM | POA: Diagnosis not present

## 2019-12-21 DIAGNOSIS — R197 Diarrhea, unspecified: Secondary | ICD-10-CM | POA: Diagnosis not present

## 2019-12-21 DIAGNOSIS — E1049 Type 1 diabetes mellitus with other diabetic neurological complication: Secondary | ICD-10-CM | POA: Diagnosis not present

## 2019-12-21 DIAGNOSIS — R1031 Right lower quadrant pain: Secondary | ICD-10-CM | POA: Diagnosis not present

## 2019-12-21 DIAGNOSIS — R739 Hyperglycemia, unspecified: Secondary | ICD-10-CM | POA: Diagnosis not present

## 2019-12-21 DIAGNOSIS — F332 Major depressive disorder, recurrent severe without psychotic features: Secondary | ICD-10-CM | POA: Diagnosis not present

## 2019-12-21 DIAGNOSIS — I2510 Atherosclerotic heart disease of native coronary artery without angina pectoris: Secondary | ICD-10-CM | POA: Diagnosis not present

## 2019-12-21 DIAGNOSIS — I69959 Hemiplegia and hemiparesis following unspecified cerebrovascular disease affecting unspecified side: Secondary | ICD-10-CM | POA: Diagnosis not present

## 2019-12-21 DIAGNOSIS — M25661 Stiffness of right knee, not elsewhere classified: Secondary | ICD-10-CM | POA: Diagnosis not present

## 2019-12-21 DIAGNOSIS — R809 Proteinuria, unspecified: Secondary | ICD-10-CM | POA: Diagnosis not present

## 2019-12-21 DIAGNOSIS — U071 COVID-19: Secondary | ICD-10-CM | POA: Diagnosis not present

## 2019-12-21 DIAGNOSIS — I4821 Permanent atrial fibrillation: Secondary | ICD-10-CM | POA: Diagnosis not present

## 2019-12-21 DIAGNOSIS — R41840 Attention and concentration deficit: Secondary | ICD-10-CM | POA: Diagnosis not present

## 2019-12-21 DIAGNOSIS — N179 Acute kidney failure, unspecified: Secondary | ICD-10-CM | POA: Diagnosis not present

## 2019-12-21 DIAGNOSIS — F1120 Opioid dependence, uncomplicated: Secondary | ICD-10-CM | POA: Diagnosis not present

## 2019-12-21 DIAGNOSIS — D631 Anemia in chronic kidney disease: Secondary | ICD-10-CM | POA: Diagnosis not present

## 2019-12-21 DIAGNOSIS — Z85828 Personal history of other malignant neoplasm of skin: Secondary | ICD-10-CM | POA: Diagnosis not present

## 2019-12-21 DIAGNOSIS — Z791 Long term (current) use of non-steroidal anti-inflammatories (NSAID): Secondary | ICD-10-CM | POA: Diagnosis not present

## 2019-12-21 DIAGNOSIS — K523 Indeterminate colitis: Secondary | ICD-10-CM | POA: Diagnosis not present

## 2019-12-21 DIAGNOSIS — D696 Thrombocytopenia, unspecified: Secondary | ICD-10-CM | POA: Diagnosis not present

## 2019-12-21 DIAGNOSIS — M79605 Pain in left leg: Secondary | ICD-10-CM | POA: Diagnosis not present

## 2019-12-21 DIAGNOSIS — I129 Hypertensive chronic kidney disease with stage 1 through stage 4 chronic kidney disease, or unspecified chronic kidney disease: Secondary | ICD-10-CM | POA: Diagnosis not present

## 2019-12-21 DIAGNOSIS — D638 Anemia in other chronic diseases classified elsewhere: Secondary | ICD-10-CM | POA: Diagnosis not present

## 2019-12-21 DIAGNOSIS — I0981 Rheumatic heart failure: Secondary | ICD-10-CM | POA: Diagnosis not present

## 2019-12-21 DIAGNOSIS — I2694 Multiple subsegmental pulmonary emboli without acute cor pulmonale: Secondary | ICD-10-CM | POA: Diagnosis not present

## 2019-12-21 DIAGNOSIS — M810 Age-related osteoporosis without current pathological fracture: Secondary | ICD-10-CM | POA: Diagnosis not present

## 2019-12-21 DIAGNOSIS — J80 Acute respiratory distress syndrome: Secondary | ICD-10-CM | POA: Diagnosis not present

## 2019-12-21 DIAGNOSIS — N400 Enlarged prostate without lower urinary tract symptoms: Secondary | ICD-10-CM | POA: Diagnosis not present

## 2019-12-21 DIAGNOSIS — R636 Underweight: Secondary | ICD-10-CM | POA: Diagnosis not present

## 2019-12-21 DIAGNOSIS — R600 Localized edema: Secondary | ICD-10-CM | POA: Diagnosis not present

## 2019-12-21 DIAGNOSIS — Z130 Encounter for screening for diseases of the blood and blood-forming organs and certain disorders involving the immune mechanism: Secondary | ICD-10-CM | POA: Diagnosis not present

## 2019-12-21 DIAGNOSIS — R82998 Other abnormal findings in urine: Secondary | ICD-10-CM | POA: Diagnosis not present

## 2019-12-21 DIAGNOSIS — J449 Chronic obstructive pulmonary disease, unspecified: Secondary | ICD-10-CM | POA: Diagnosis not present

## 2019-12-21 DIAGNOSIS — R2689 Other abnormalities of gait and mobility: Secondary | ICD-10-CM | POA: Diagnosis not present

## 2019-12-21 DIAGNOSIS — Z823 Family history of stroke: Secondary | ICD-10-CM | POA: Diagnosis not present

## 2019-12-21 DIAGNOSIS — J019 Acute sinusitis, unspecified: Secondary | ICD-10-CM | POA: Diagnosis not present

## 2019-12-21 DIAGNOSIS — I998 Other disorder of circulatory system: Secondary | ICD-10-CM | POA: Diagnosis not present

## 2019-12-21 DIAGNOSIS — J9621 Acute and chronic respiratory failure with hypoxia: Secondary | ICD-10-CM | POA: Diagnosis not present

## 2019-12-21 DIAGNOSIS — R29898 Other symptoms and signs involving the musculoskeletal system: Secondary | ICD-10-CM | POA: Diagnosis not present

## 2019-12-21 DIAGNOSIS — I639 Cerebral infarction, unspecified: Secondary | ICD-10-CM | POA: Diagnosis not present

## 2019-12-21 DIAGNOSIS — R0609 Other forms of dyspnea: Secondary | ICD-10-CM | POA: Diagnosis not present

## 2019-12-21 DIAGNOSIS — M1712 Unilateral primary osteoarthritis, left knee: Secondary | ICD-10-CM | POA: Diagnosis not present

## 2019-12-21 DIAGNOSIS — D649 Anemia, unspecified: Secondary | ICD-10-CM | POA: Diagnosis not present

## 2019-12-21 DIAGNOSIS — I4811 Longstanding persistent atrial fibrillation: Secondary | ICD-10-CM | POA: Diagnosis not present

## 2019-12-21 DIAGNOSIS — R0602 Shortness of breath: Secondary | ICD-10-CM | POA: Diagnosis not present

## 2019-12-21 DIAGNOSIS — S72001D Fracture of unspecified part of neck of right femur, subsequent encounter for closed fracture with routine healing: Secondary | ICD-10-CM | POA: Diagnosis not present

## 2019-12-21 DIAGNOSIS — D689 Coagulation defect, unspecified: Secondary | ICD-10-CM | POA: Diagnosis not present

## 2019-12-21 DIAGNOSIS — M109 Gout, unspecified: Secondary | ICD-10-CM | POA: Diagnosis not present

## 2019-12-21 DIAGNOSIS — K30 Functional dyspepsia: Secondary | ICD-10-CM | POA: Diagnosis not present

## 2019-12-21 DIAGNOSIS — Z6838 Body mass index (BMI) 38.0-38.9, adult: Secondary | ICD-10-CM | POA: Diagnosis not present

## 2019-12-21 DIAGNOSIS — C7951 Secondary malignant neoplasm of bone: Secondary | ICD-10-CM | POA: Diagnosis not present

## 2019-12-21 DIAGNOSIS — R55 Syncope and collapse: Secondary | ICD-10-CM | POA: Diagnosis not present

## 2019-12-21 DIAGNOSIS — E1129 Type 2 diabetes mellitus with other diabetic kidney complication: Secondary | ICD-10-CM | POA: Diagnosis not present

## 2019-12-21 DIAGNOSIS — Z955 Presence of coronary angioplasty implant and graft: Secondary | ICD-10-CM | POA: Diagnosis not present

## 2019-12-21 DIAGNOSIS — R279 Unspecified lack of coordination: Secondary | ICD-10-CM | POA: Diagnosis not present

## 2019-12-21 DIAGNOSIS — Z1389 Encounter for screening for other disorder: Secondary | ICD-10-CM | POA: Diagnosis not present

## 2019-12-21 DIAGNOSIS — Z6841 Body Mass Index (BMI) 40.0 and over, adult: Secondary | ICD-10-CM | POA: Diagnosis not present

## 2019-12-22 DIAGNOSIS — H25013 Cortical age-related cataract, bilateral: Secondary | ICD-10-CM | POA: Diagnosis not present

## 2019-12-22 DIAGNOSIS — Z20828 Contact with and (suspected) exposure to other viral communicable diseases: Secondary | ICD-10-CM | POA: Diagnosis not present

## 2019-12-22 DIAGNOSIS — H43811 Vitreous degeneration, right eye: Secondary | ICD-10-CM | POA: Diagnosis not present

## 2019-12-22 DIAGNOSIS — N3090 Cystitis, unspecified without hematuria: Secondary | ICD-10-CM | POA: Diagnosis not present

## 2019-12-22 DIAGNOSIS — M1711 Unilateral primary osteoarthritis, right knee: Secondary | ICD-10-CM | POA: Diagnosis not present

## 2019-12-22 DIAGNOSIS — E559 Vitamin D deficiency, unspecified: Secondary | ICD-10-CM | POA: Diagnosis not present

## 2019-12-22 DIAGNOSIS — D696 Thrombocytopenia, unspecified: Secondary | ICD-10-CM | POA: Diagnosis not present

## 2019-12-22 DIAGNOSIS — I6522 Occlusion and stenosis of left carotid artery: Secondary | ICD-10-CM | POA: Diagnosis not present

## 2019-12-22 DIAGNOSIS — D649 Anemia, unspecified: Secondary | ICD-10-CM | POA: Diagnosis not present

## 2019-12-22 DIAGNOSIS — R0602 Shortness of breath: Secondary | ICD-10-CM | POA: Diagnosis not present

## 2019-12-22 DIAGNOSIS — M1710 Unilateral primary osteoarthritis, unspecified knee: Secondary | ICD-10-CM | POA: Diagnosis not present

## 2019-12-22 DIAGNOSIS — R410 Disorientation, unspecified: Secondary | ICD-10-CM | POA: Diagnosis not present

## 2019-12-22 DIAGNOSIS — M9902 Segmental and somatic dysfunction of thoracic region: Secondary | ICD-10-CM | POA: Diagnosis not present

## 2019-12-22 DIAGNOSIS — E1122 Type 2 diabetes mellitus with diabetic chronic kidney disease: Secondary | ICD-10-CM | POA: Diagnosis not present

## 2019-12-22 DIAGNOSIS — E7849 Other hyperlipidemia: Secondary | ICD-10-CM | POA: Diagnosis not present

## 2019-12-22 DIAGNOSIS — F17210 Nicotine dependence, cigarettes, uncomplicated: Secondary | ICD-10-CM | POA: Diagnosis not present

## 2019-12-22 DIAGNOSIS — J069 Acute upper respiratory infection, unspecified: Secondary | ICD-10-CM | POA: Diagnosis not present

## 2019-12-22 DIAGNOSIS — J449 Chronic obstructive pulmonary disease, unspecified: Secondary | ICD-10-CM | POA: Diagnosis not present

## 2019-12-22 DIAGNOSIS — Z79891 Long term (current) use of opiate analgesic: Secondary | ICD-10-CM | POA: Diagnosis not present

## 2019-12-22 DIAGNOSIS — R6889 Other general symptoms and signs: Secondary | ICD-10-CM | POA: Diagnosis not present

## 2019-12-22 DIAGNOSIS — M546 Pain in thoracic spine: Secondary | ICD-10-CM | POA: Diagnosis not present

## 2019-12-22 DIAGNOSIS — N2581 Secondary hyperparathyroidism of renal origin: Secondary | ICD-10-CM | POA: Diagnosis not present

## 2019-12-22 DIAGNOSIS — H01132 Eczematous dermatitis of right lower eyelid: Secondary | ICD-10-CM | POA: Diagnosis not present

## 2019-12-22 DIAGNOSIS — M0609 Rheumatoid arthritis without rheumatoid factor, multiple sites: Secondary | ICD-10-CM | POA: Diagnosis not present

## 2019-12-22 DIAGNOSIS — L98411 Non-pressure chronic ulcer of buttock limited to breakdown of skin: Secondary | ICD-10-CM | POA: Diagnosis not present

## 2019-12-22 DIAGNOSIS — I4819 Other persistent atrial fibrillation: Secondary | ICD-10-CM | POA: Diagnosis not present

## 2019-12-22 DIAGNOSIS — N200 Calculus of kidney: Secondary | ICD-10-CM | POA: Diagnosis not present

## 2019-12-22 DIAGNOSIS — M818 Other osteoporosis without current pathological fracture: Secondary | ICD-10-CM | POA: Diagnosis not present

## 2019-12-22 DIAGNOSIS — K5900 Constipation, unspecified: Secondary | ICD-10-CM | POA: Diagnosis not present

## 2019-12-22 DIAGNOSIS — E663 Overweight: Secondary | ICD-10-CM | POA: Diagnosis not present

## 2019-12-22 DIAGNOSIS — M069 Rheumatoid arthritis, unspecified: Secondary | ICD-10-CM | POA: Diagnosis not present

## 2019-12-22 DIAGNOSIS — Z794 Long term (current) use of insulin: Secondary | ICD-10-CM | POA: Diagnosis not present

## 2019-12-22 DIAGNOSIS — Z8679 Personal history of other diseases of the circulatory system: Secondary | ICD-10-CM | POA: Diagnosis not present

## 2019-12-22 DIAGNOSIS — I2510 Atherosclerotic heart disease of native coronary artery without angina pectoris: Secondary | ICD-10-CM | POA: Diagnosis not present

## 2019-12-22 DIAGNOSIS — H52223 Regular astigmatism, bilateral: Secondary | ICD-10-CM | POA: Diagnosis not present

## 2019-12-22 DIAGNOSIS — R300 Dysuria: Secondary | ICD-10-CM | POA: Diagnosis not present

## 2019-12-22 DIAGNOSIS — H2513 Age-related nuclear cataract, bilateral: Secondary | ICD-10-CM | POA: Diagnosis not present

## 2019-12-22 DIAGNOSIS — R799 Abnormal finding of blood chemistry, unspecified: Secondary | ICD-10-CM | POA: Diagnosis not present

## 2019-12-22 DIAGNOSIS — H401231 Low-tension glaucoma, bilateral, mild stage: Secondary | ICD-10-CM | POA: Diagnosis not present

## 2019-12-22 DIAGNOSIS — Z9181 History of falling: Secondary | ICD-10-CM | POA: Diagnosis not present

## 2019-12-22 DIAGNOSIS — B0052 Herpesviral keratitis: Secondary | ICD-10-CM | POA: Diagnosis not present

## 2019-12-22 DIAGNOSIS — I5023 Acute on chronic systolic (congestive) heart failure: Secondary | ICD-10-CM | POA: Diagnosis not present

## 2019-12-22 DIAGNOSIS — H04123 Dry eye syndrome of bilateral lacrimal glands: Secondary | ICD-10-CM | POA: Diagnosis not present

## 2019-12-22 DIAGNOSIS — Z6833 Body mass index (BMI) 33.0-33.9, adult: Secondary | ICD-10-CM | POA: Diagnosis not present

## 2019-12-22 DIAGNOSIS — Z09 Encounter for follow-up examination after completed treatment for conditions other than malignant neoplasm: Secondary | ICD-10-CM | POA: Diagnosis not present

## 2019-12-22 DIAGNOSIS — M25562 Pain in left knee: Secondary | ICD-10-CM | POA: Diagnosis not present

## 2019-12-22 DIAGNOSIS — I4891 Unspecified atrial fibrillation: Secondary | ICD-10-CM | POA: Diagnosis not present

## 2019-12-22 DIAGNOSIS — L570 Actinic keratosis: Secondary | ICD-10-CM | POA: Diagnosis not present

## 2019-12-22 DIAGNOSIS — N052 Unspecified nephritic syndrome with diffuse membranous glomerulonephritis: Secondary | ICD-10-CM | POA: Diagnosis not present

## 2019-12-22 DIAGNOSIS — U071 COVID-19: Secondary | ICD-10-CM | POA: Diagnosis not present

## 2019-12-22 DIAGNOSIS — E790 Hyperuricemia without signs of inflammatory arthritis and tophaceous disease: Secondary | ICD-10-CM | POA: Diagnosis not present

## 2019-12-22 DIAGNOSIS — E876 Hypokalemia: Secondary | ICD-10-CM | POA: Diagnosis not present

## 2019-12-22 DIAGNOSIS — I441 Atrioventricular block, second degree: Secondary | ICD-10-CM | POA: Diagnosis not present

## 2019-12-22 DIAGNOSIS — R1904 Left lower quadrant abdominal swelling, mass and lump: Secondary | ICD-10-CM | POA: Diagnosis not present

## 2019-12-22 DIAGNOSIS — M109 Gout, unspecified: Secondary | ICD-10-CM | POA: Diagnosis not present

## 2019-12-22 DIAGNOSIS — R2689 Other abnormalities of gait and mobility: Secondary | ICD-10-CM | POA: Diagnosis not present

## 2019-12-22 DIAGNOSIS — R531 Weakness: Secondary | ICD-10-CM | POA: Diagnosis not present

## 2019-12-22 DIAGNOSIS — Z0001 Encounter for general adult medical examination with abnormal findings: Secondary | ICD-10-CM | POA: Diagnosis not present

## 2019-12-22 DIAGNOSIS — D508 Other iron deficiency anemias: Secondary | ICD-10-CM | POA: Diagnosis not present

## 2019-12-22 DIAGNOSIS — E118 Type 2 diabetes mellitus with unspecified complications: Secondary | ICD-10-CM | POA: Diagnosis not present

## 2019-12-22 DIAGNOSIS — R739 Hyperglycemia, unspecified: Secondary | ICD-10-CM | POA: Diagnosis not present

## 2019-12-22 DIAGNOSIS — I651 Occlusion and stenosis of basilar artery: Secondary | ICD-10-CM | POA: Diagnosis not present

## 2019-12-22 DIAGNOSIS — Z9010 Acquired absence of unspecified breast and nipple: Secondary | ICD-10-CM | POA: Diagnosis not present

## 2019-12-22 DIAGNOSIS — N183 Chronic kidney disease, stage 3 unspecified: Secondary | ICD-10-CM | POA: Diagnosis not present

## 2019-12-22 DIAGNOSIS — R072 Precordial pain: Secondary | ICD-10-CM | POA: Diagnosis not present

## 2019-12-22 DIAGNOSIS — I700 Atherosclerosis of aorta: Secondary | ICD-10-CM | POA: Diagnosis not present

## 2019-12-22 DIAGNOSIS — R41841 Cognitive communication deficit: Secondary | ICD-10-CM | POA: Diagnosis not present

## 2019-12-22 DIAGNOSIS — F419 Anxiety disorder, unspecified: Secondary | ICD-10-CM | POA: Diagnosis not present

## 2019-12-22 DIAGNOSIS — I739 Peripheral vascular disease, unspecified: Secondary | ICD-10-CM | POA: Diagnosis not present

## 2019-12-22 DIAGNOSIS — Z7984 Long term (current) use of oral hypoglycemic drugs: Secondary | ICD-10-CM | POA: Diagnosis not present

## 2019-12-22 DIAGNOSIS — K219 Gastro-esophageal reflux disease without esophagitis: Secondary | ICD-10-CM | POA: Diagnosis not present

## 2019-12-22 DIAGNOSIS — R82998 Other abnormal findings in urine: Secondary | ICD-10-CM | POA: Diagnosis not present

## 2019-12-22 DIAGNOSIS — L821 Other seborrheic keratosis: Secondary | ICD-10-CM | POA: Diagnosis not present

## 2019-12-22 DIAGNOSIS — M7989 Other specified soft tissue disorders: Secondary | ICD-10-CM | POA: Diagnosis not present

## 2019-12-22 DIAGNOSIS — Z961 Presence of intraocular lens: Secondary | ICD-10-CM | POA: Diagnosis not present

## 2019-12-22 DIAGNOSIS — C73 Malignant neoplasm of thyroid gland: Secondary | ICD-10-CM | POA: Diagnosis not present

## 2019-12-22 DIAGNOSIS — C069 Malignant neoplasm of mouth, unspecified: Secondary | ICD-10-CM | POA: Diagnosis not present

## 2019-12-22 DIAGNOSIS — I25119 Atherosclerotic heart disease of native coronary artery with unspecified angina pectoris: Secondary | ICD-10-CM | POA: Diagnosis not present

## 2019-12-22 DIAGNOSIS — I472 Ventricular tachycardia: Secondary | ICD-10-CM | POA: Diagnosis not present

## 2019-12-22 DIAGNOSIS — I110 Hypertensive heart disease with heart failure: Secondary | ICD-10-CM | POA: Diagnosis not present

## 2019-12-22 DIAGNOSIS — N400 Enlarged prostate without lower urinary tract symptoms: Secondary | ICD-10-CM | POA: Diagnosis not present

## 2019-12-22 DIAGNOSIS — R0981 Nasal congestion: Secondary | ICD-10-CM | POA: Diagnosis not present

## 2019-12-22 DIAGNOSIS — M791 Myalgia, unspecified site: Secondary | ICD-10-CM | POA: Diagnosis not present

## 2019-12-22 DIAGNOSIS — R5382 Chronic fatigue, unspecified: Secondary | ICD-10-CM | POA: Diagnosis not present

## 2019-12-22 DIAGNOSIS — R002 Palpitations: Secondary | ICD-10-CM | POA: Diagnosis not present

## 2019-12-22 DIAGNOSIS — M955 Acquired deformity of pelvis: Secondary | ICD-10-CM | POA: Diagnosis not present

## 2019-12-22 DIAGNOSIS — M150 Primary generalized (osteo)arthritis: Secondary | ICD-10-CM | POA: Diagnosis not present

## 2019-12-22 DIAGNOSIS — H5203 Hypermetropia, bilateral: Secondary | ICD-10-CM | POA: Diagnosis not present

## 2019-12-22 DIAGNOSIS — R519 Headache, unspecified: Secondary | ICD-10-CM | POA: Diagnosis not present

## 2019-12-22 DIAGNOSIS — I313 Pericardial effusion (noninflammatory): Secondary | ICD-10-CM | POA: Diagnosis not present

## 2019-12-22 DIAGNOSIS — N1832 Chronic kidney disease, stage 3b: Secondary | ICD-10-CM | POA: Diagnosis not present

## 2019-12-22 DIAGNOSIS — E538 Deficiency of other specified B group vitamins: Secondary | ICD-10-CM | POA: Diagnosis not present

## 2019-12-22 DIAGNOSIS — E1121 Type 2 diabetes mellitus with diabetic nephropathy: Secondary | ICD-10-CM | POA: Diagnosis not present

## 2019-12-22 DIAGNOSIS — Z7982 Long term (current) use of aspirin: Secondary | ICD-10-CM | POA: Diagnosis not present

## 2019-12-22 DIAGNOSIS — E781 Pure hyperglyceridemia: Secondary | ICD-10-CM | POA: Diagnosis not present

## 2019-12-22 DIAGNOSIS — I447 Left bundle-branch block, unspecified: Secondary | ICD-10-CM | POA: Diagnosis not present

## 2019-12-22 DIAGNOSIS — I5033 Acute on chronic diastolic (congestive) heart failure: Secondary | ICD-10-CM | POA: Diagnosis not present

## 2019-12-22 DIAGNOSIS — G441 Vascular headache, not elsewhere classified: Secondary | ICD-10-CM | POA: Diagnosis not present

## 2019-12-22 DIAGNOSIS — W19XXXD Unspecified fall, subsequent encounter: Secondary | ICD-10-CM | POA: Diagnosis not present

## 2019-12-22 DIAGNOSIS — M5441 Lumbago with sciatica, right side: Secondary | ICD-10-CM | POA: Diagnosis not present

## 2019-12-22 DIAGNOSIS — J90 Pleural effusion, not elsewhere classified: Secondary | ICD-10-CM | POA: Diagnosis not present

## 2019-12-22 DIAGNOSIS — M810 Age-related osteoporosis without current pathological fracture: Secondary | ICD-10-CM | POA: Diagnosis not present

## 2019-12-22 DIAGNOSIS — Z0000 Encounter for general adult medical examination without abnormal findings: Secondary | ICD-10-CM | POA: Diagnosis not present

## 2019-12-22 DIAGNOSIS — E119 Type 2 diabetes mellitus without complications: Secondary | ICD-10-CM | POA: Diagnosis not present

## 2019-12-22 DIAGNOSIS — E875 Hyperkalemia: Secondary | ICD-10-CM | POA: Diagnosis not present

## 2019-12-22 DIAGNOSIS — H5713 Ocular pain, bilateral: Secondary | ICD-10-CM | POA: Diagnosis not present

## 2019-12-22 DIAGNOSIS — F17220 Nicotine dependence, chewing tobacco, uncomplicated: Secondary | ICD-10-CM | POA: Diagnosis not present

## 2019-12-22 DIAGNOSIS — G4730 Sleep apnea, unspecified: Secondary | ICD-10-CM | POA: Diagnosis not present

## 2019-12-22 DIAGNOSIS — R9721 Rising PSA following treatment for malignant neoplasm of prostate: Secondary | ICD-10-CM | POA: Diagnosis not present

## 2019-12-22 DIAGNOSIS — Z1389 Encounter for screening for other disorder: Secondary | ICD-10-CM | POA: Diagnosis not present

## 2019-12-22 DIAGNOSIS — M79645 Pain in left finger(s): Secondary | ICD-10-CM | POA: Diagnosis not present

## 2019-12-22 DIAGNOSIS — R351 Nocturia: Secondary | ICD-10-CM | POA: Diagnosis not present

## 2019-12-22 DIAGNOSIS — G35 Multiple sclerosis: Secondary | ICD-10-CM | POA: Diagnosis not present

## 2019-12-22 DIAGNOSIS — M1812 Unilateral primary osteoarthritis of first carpometacarpal joint, left hand: Secondary | ICD-10-CM | POA: Diagnosis not present

## 2019-12-22 DIAGNOSIS — I77819 Aortic ectasia, unspecified site: Secondary | ICD-10-CM | POA: Diagnosis not present

## 2019-12-22 DIAGNOSIS — E7800 Pure hypercholesterolemia, unspecified: Secondary | ICD-10-CM | POA: Diagnosis not present

## 2019-12-22 DIAGNOSIS — Z20822 Contact with and (suspected) exposure to covid-19: Secondary | ICD-10-CM | POA: Diagnosis not present

## 2019-12-22 DIAGNOSIS — Z21 Asymptomatic human immunodeficiency virus [HIV] infection status: Secondary | ICD-10-CM | POA: Diagnosis not present

## 2019-12-22 DIAGNOSIS — R413 Other amnesia: Secondary | ICD-10-CM | POA: Diagnosis not present

## 2019-12-22 DIAGNOSIS — I480 Paroxysmal atrial fibrillation: Secondary | ICD-10-CM | POA: Diagnosis not present

## 2019-12-22 DIAGNOSIS — R7303 Prediabetes: Secondary | ICD-10-CM | POA: Diagnosis not present

## 2019-12-22 DIAGNOSIS — H524 Presbyopia: Secondary | ICD-10-CM | POA: Diagnosis not present

## 2019-12-22 DIAGNOSIS — E291 Testicular hypofunction: Secondary | ICD-10-CM | POA: Diagnosis not present

## 2019-12-22 DIAGNOSIS — H539 Unspecified visual disturbance: Secondary | ICD-10-CM | POA: Diagnosis not present

## 2019-12-22 DIAGNOSIS — M1A00X0 Idiopathic chronic gout, unspecified site, without tophus (tophi): Secondary | ICD-10-CM | POA: Diagnosis not present

## 2019-12-22 DIAGNOSIS — M9905 Segmental and somatic dysfunction of pelvic region: Secondary | ICD-10-CM | POA: Diagnosis not present

## 2019-12-22 DIAGNOSIS — E039 Hypothyroidism, unspecified: Secondary | ICD-10-CM | POA: Diagnosis not present

## 2019-12-22 DIAGNOSIS — R069 Unspecified abnormalities of breathing: Secondary | ICD-10-CM | POA: Diagnosis not present

## 2019-12-22 DIAGNOSIS — J939 Pneumothorax, unspecified: Secondary | ICD-10-CM | POA: Diagnosis not present

## 2019-12-22 DIAGNOSIS — R5381 Other malaise: Secondary | ICD-10-CM | POA: Diagnosis not present

## 2019-12-22 DIAGNOSIS — R350 Frequency of micturition: Secondary | ICD-10-CM | POA: Diagnosis not present

## 2019-12-22 DIAGNOSIS — I509 Heart failure, unspecified: Secondary | ICD-10-CM | POA: Diagnosis not present

## 2019-12-22 DIAGNOSIS — F411 Generalized anxiety disorder: Secondary | ICD-10-CM | POA: Diagnosis not present

## 2019-12-22 DIAGNOSIS — Z4800 Encounter for change or removal of nonsurgical wound dressing: Secondary | ICD-10-CM | POA: Diagnosis not present

## 2019-12-22 DIAGNOSIS — H81399 Other peripheral vertigo, unspecified ear: Secondary | ICD-10-CM | POA: Diagnosis not present

## 2019-12-22 DIAGNOSIS — Z7689 Persons encountering health services in other specified circumstances: Secondary | ICD-10-CM | POA: Diagnosis not present

## 2019-12-22 DIAGNOSIS — N319 Neuromuscular dysfunction of bladder, unspecified: Secondary | ICD-10-CM | POA: Diagnosis not present

## 2019-12-22 DIAGNOSIS — I10 Essential (primary) hypertension: Secondary | ICD-10-CM | POA: Diagnosis not present

## 2019-12-22 DIAGNOSIS — J9601 Acute respiratory failure with hypoxia: Secondary | ICD-10-CM | POA: Diagnosis not present

## 2019-12-22 DIAGNOSIS — I5032 Chronic diastolic (congestive) heart failure: Secondary | ICD-10-CM | POA: Diagnosis not present

## 2019-12-22 DIAGNOSIS — Z853 Personal history of malignant neoplasm of breast: Secondary | ICD-10-CM | POA: Diagnosis not present

## 2019-12-22 DIAGNOSIS — E782 Mixed hyperlipidemia: Secondary | ICD-10-CM | POA: Diagnosis not present

## 2019-12-22 DIAGNOSIS — R972 Elevated prostate specific antigen [PSA]: Secondary | ICD-10-CM | POA: Diagnosis not present

## 2019-12-22 DIAGNOSIS — E088 Diabetes mellitus due to underlying condition with unspecified complications: Secondary | ICD-10-CM | POA: Diagnosis not present

## 2019-12-22 DIAGNOSIS — Z1331 Encounter for screening for depression: Secondary | ICD-10-CM | POA: Diagnosis not present

## 2019-12-22 DIAGNOSIS — N390 Urinary tract infection, site not specified: Secondary | ICD-10-CM | POA: Diagnosis not present

## 2019-12-22 DIAGNOSIS — H34831 Tributary (branch) retinal vein occlusion, right eye, with macular edema: Secondary | ICD-10-CM | POA: Diagnosis not present

## 2019-12-22 DIAGNOSIS — Z86718 Personal history of other venous thrombosis and embolism: Secondary | ICD-10-CM | POA: Diagnosis not present

## 2019-12-22 DIAGNOSIS — I300 Acute nonspecific idiopathic pericarditis: Secondary | ICD-10-CM | POA: Diagnosis not present

## 2019-12-22 DIAGNOSIS — R05 Cough: Secondary | ICD-10-CM | POA: Diagnosis not present

## 2019-12-22 DIAGNOSIS — M9903 Segmental and somatic dysfunction of lumbar region: Secondary | ICD-10-CM | POA: Diagnosis not present

## 2019-12-22 DIAGNOSIS — Z79899 Other long term (current) drug therapy: Secondary | ICD-10-CM | POA: Diagnosis not present

## 2019-12-22 DIAGNOSIS — I4811 Longstanding persistent atrial fibrillation: Secondary | ICD-10-CM | POA: Diagnosis not present

## 2019-12-22 DIAGNOSIS — Z125 Encounter for screening for malignant neoplasm of prostate: Secondary | ICD-10-CM | POA: Diagnosis not present

## 2019-12-22 DIAGNOSIS — Z136 Encounter for screening for cardiovascular disorders: Secondary | ICD-10-CM | POA: Diagnosis not present

## 2019-12-22 DIAGNOSIS — Z13220 Encounter for screening for lipoid disorders: Secondary | ICD-10-CM | POA: Diagnosis not present

## 2019-12-22 DIAGNOSIS — M797 Fibromyalgia: Secondary | ICD-10-CM | POA: Diagnosis not present

## 2019-12-22 DIAGNOSIS — E049 Nontoxic goiter, unspecified: Secondary | ICD-10-CM | POA: Diagnosis not present

## 2019-12-22 DIAGNOSIS — Z1159 Encounter for screening for other viral diseases: Secondary | ICD-10-CM | POA: Diagnosis not present

## 2019-12-22 DIAGNOSIS — R51 Headache with orthostatic component, not elsewhere classified: Secondary | ICD-10-CM | POA: Diagnosis not present

## 2019-12-22 DIAGNOSIS — I255 Ischemic cardiomyopathy: Secondary | ICD-10-CM | POA: Diagnosis not present

## 2019-12-22 DIAGNOSIS — R7302 Impaired glucose tolerance (oral): Secondary | ICD-10-CM | POA: Diagnosis not present

## 2019-12-22 DIAGNOSIS — I421 Obstructive hypertrophic cardiomyopathy: Secondary | ICD-10-CM | POA: Diagnosis not present

## 2019-12-22 DIAGNOSIS — Z7901 Long term (current) use of anticoagulants: Secondary | ICD-10-CM | POA: Diagnosis not present

## 2019-12-22 DIAGNOSIS — N189 Chronic kidney disease, unspecified: Secondary | ICD-10-CM | POA: Diagnosis not present

## 2019-12-22 DIAGNOSIS — E1142 Type 2 diabetes mellitus with diabetic polyneuropathy: Secondary | ICD-10-CM | POA: Diagnosis not present

## 2019-12-22 DIAGNOSIS — R768 Other specified abnormal immunological findings in serum: Secondary | ICD-10-CM | POA: Diagnosis not present

## 2019-12-22 DIAGNOSIS — I714 Abdominal aortic aneurysm, without rupture: Secondary | ICD-10-CM | POA: Diagnosis not present

## 2019-12-22 DIAGNOSIS — I5022 Chronic systolic (congestive) heart failure: Secondary | ICD-10-CM | POA: Diagnosis not present

## 2019-12-22 DIAGNOSIS — I130 Hypertensive heart and chronic kidney disease with heart failure and stage 1 through stage 4 chronic kidney disease, or unspecified chronic kidney disease: Secondary | ICD-10-CM | POA: Diagnosis not present

## 2019-12-22 DIAGNOSIS — H35033 Hypertensive retinopathy, bilateral: Secondary | ICD-10-CM | POA: Diagnosis not present

## 2019-12-22 DIAGNOSIS — F4310 Post-traumatic stress disorder, unspecified: Secondary | ICD-10-CM | POA: Diagnosis not present

## 2019-12-22 DIAGNOSIS — D509 Iron deficiency anemia, unspecified: Secondary | ICD-10-CM | POA: Diagnosis not present

## 2019-12-22 DIAGNOSIS — R55 Syncope and collapse: Secondary | ICD-10-CM | POA: Diagnosis not present

## 2019-12-22 DIAGNOSIS — R0789 Other chest pain: Secondary | ICD-10-CM | POA: Diagnosis not present

## 2019-12-22 DIAGNOSIS — E1165 Type 2 diabetes mellitus with hyperglycemia: Secondary | ICD-10-CM | POA: Diagnosis not present

## 2019-12-22 DIAGNOSIS — H3582 Retinal ischemia: Secondary | ICD-10-CM | POA: Diagnosis not present

## 2019-12-22 DIAGNOSIS — G8929 Other chronic pain: Secondary | ICD-10-CM | POA: Diagnosis not present

## 2019-12-22 DIAGNOSIS — M7540 Impingement syndrome of unspecified shoulder: Secondary | ICD-10-CM | POA: Diagnosis not present

## 2019-12-22 DIAGNOSIS — I5042 Chronic combined systolic (congestive) and diastolic (congestive) heart failure: Secondary | ICD-10-CM | POA: Diagnosis not present

## 2019-12-22 DIAGNOSIS — I129 Hypertensive chronic kidney disease with stage 1 through stage 4 chronic kidney disease, or unspecified chronic kidney disease: Secondary | ICD-10-CM | POA: Diagnosis not present

## 2019-12-22 DIAGNOSIS — I779 Disorder of arteries and arterioles, unspecified: Secondary | ICD-10-CM | POA: Diagnosis not present

## 2019-12-22 DIAGNOSIS — R079 Chest pain, unspecified: Secondary | ICD-10-CM | POA: Diagnosis not present

## 2019-12-22 DIAGNOSIS — M80022D Age-related osteoporosis with current pathological fracture, left humerus, subsequent encounter for fracture with routine healing: Secondary | ICD-10-CM | POA: Diagnosis not present

## 2019-12-22 DIAGNOSIS — Z01818 Encounter for other preprocedural examination: Secondary | ICD-10-CM | POA: Diagnosis not present

## 2019-12-22 DIAGNOSIS — Z1211 Encounter for screening for malignant neoplasm of colon: Secondary | ICD-10-CM | POA: Diagnosis not present

## 2019-12-22 DIAGNOSIS — Z951 Presence of aortocoronary bypass graft: Secondary | ICD-10-CM | POA: Diagnosis not present

## 2019-12-22 DIAGNOSIS — R339 Retention of urine, unspecified: Secondary | ICD-10-CM | POA: Diagnosis not present

## 2019-12-22 DIAGNOSIS — R5383 Other fatigue: Secondary | ICD-10-CM | POA: Diagnosis not present

## 2019-12-22 DIAGNOSIS — E785 Hyperlipidemia, unspecified: Secondary | ICD-10-CM | POA: Diagnosis not present

## 2019-12-22 DIAGNOSIS — I6350 Cerebral infarction due to unspecified occlusion or stenosis of unspecified cerebral artery: Secondary | ICD-10-CM | POA: Diagnosis not present

## 2019-12-22 DIAGNOSIS — Z953 Presence of xenogenic heart valve: Secondary | ICD-10-CM | POA: Diagnosis not present

## 2019-12-22 DIAGNOSIS — H5213 Myopia, bilateral: Secondary | ICD-10-CM | POA: Diagnosis not present

## 2019-12-22 DIAGNOSIS — F5101 Primary insomnia: Secondary | ICD-10-CM | POA: Diagnosis not present

## 2019-12-22 DIAGNOSIS — M0579 Rheumatoid arthritis with rheumatoid factor of multiple sites without organ or systems involvement: Secondary | ICD-10-CM | POA: Diagnosis not present

## 2019-12-23 DIAGNOSIS — E088 Diabetes mellitus due to underlying condition with unspecified complications: Secondary | ICD-10-CM | POA: Diagnosis not present

## 2019-12-23 DIAGNOSIS — I425 Other restrictive cardiomyopathy: Secondary | ICD-10-CM | POA: Diagnosis not present

## 2019-12-23 DIAGNOSIS — M545 Low back pain: Secondary | ICD-10-CM | POA: Diagnosis not present

## 2019-12-23 DIAGNOSIS — I499 Cardiac arrhythmia, unspecified: Secondary | ICD-10-CM | POA: Diagnosis not present

## 2019-12-23 DIAGNOSIS — I110 Hypertensive heart disease with heart failure: Secondary | ICD-10-CM | POA: Diagnosis not present

## 2019-12-23 DIAGNOSIS — E876 Hypokalemia: Secondary | ICD-10-CM | POA: Diagnosis not present

## 2019-12-23 DIAGNOSIS — R109 Unspecified abdominal pain: Secondary | ICD-10-CM | POA: Diagnosis not present

## 2019-12-23 DIAGNOSIS — Z01810 Encounter for preprocedural cardiovascular examination: Secondary | ICD-10-CM | POA: Diagnosis not present

## 2019-12-23 DIAGNOSIS — R0600 Dyspnea, unspecified: Secondary | ICD-10-CM | POA: Diagnosis not present

## 2019-12-23 DIAGNOSIS — I2729 Other secondary pulmonary hypertension: Secondary | ICD-10-CM | POA: Diagnosis not present

## 2019-12-23 DIAGNOSIS — C61 Malignant neoplasm of prostate: Secondary | ICD-10-CM | POA: Diagnosis not present

## 2019-12-23 DIAGNOSIS — D519 Vitamin B12 deficiency anemia, unspecified: Secondary | ICD-10-CM | POA: Diagnosis not present

## 2019-12-23 DIAGNOSIS — H269 Unspecified cataract: Secondary | ICD-10-CM | POA: Diagnosis not present

## 2019-12-23 DIAGNOSIS — R944 Abnormal results of kidney function studies: Secondary | ICD-10-CM | POA: Diagnosis not present

## 2019-12-23 DIAGNOSIS — R300 Dysuria: Secondary | ICD-10-CM | POA: Diagnosis not present

## 2019-12-23 DIAGNOSIS — M9903 Segmental and somatic dysfunction of lumbar region: Secondary | ICD-10-CM | POA: Diagnosis not present

## 2019-12-23 DIAGNOSIS — E871 Hypo-osmolality and hyponatremia: Secondary | ICD-10-CM | POA: Diagnosis not present

## 2019-12-23 DIAGNOSIS — E1129 Type 2 diabetes mellitus with other diabetic kidney complication: Secondary | ICD-10-CM | POA: Diagnosis not present

## 2019-12-23 DIAGNOSIS — E611 Iron deficiency: Secondary | ICD-10-CM | POA: Diagnosis not present

## 2019-12-23 DIAGNOSIS — Z7901 Long term (current) use of anticoagulants: Secondary | ICD-10-CM | POA: Diagnosis not present

## 2019-12-23 DIAGNOSIS — L03115 Cellulitis of right lower limb: Secondary | ICD-10-CM | POA: Diagnosis not present

## 2019-12-23 DIAGNOSIS — Z951 Presence of aortocoronary bypass graft: Secondary | ICD-10-CM | POA: Diagnosis not present

## 2019-12-23 DIAGNOSIS — Z6832 Body mass index (BMI) 32.0-32.9, adult: Secondary | ICD-10-CM | POA: Diagnosis not present

## 2019-12-23 DIAGNOSIS — E890 Postprocedural hypothyroidism: Secondary | ICD-10-CM | POA: Diagnosis not present

## 2019-12-23 DIAGNOSIS — D485 Neoplasm of uncertain behavior of skin: Secondary | ICD-10-CM | POA: Diagnosis not present

## 2019-12-23 DIAGNOSIS — E039 Hypothyroidism, unspecified: Secondary | ICD-10-CM | POA: Diagnosis not present

## 2019-12-23 DIAGNOSIS — D72824 Basophilia: Secondary | ICD-10-CM | POA: Diagnosis not present

## 2019-12-23 DIAGNOSIS — I43 Cardiomyopathy in diseases classified elsewhere: Secondary | ICD-10-CM | POA: Diagnosis not present

## 2019-12-23 DIAGNOSIS — M810 Age-related osteoporosis without current pathological fracture: Secondary | ICD-10-CM | POA: Diagnosis not present

## 2019-12-23 DIAGNOSIS — Z7984 Long term (current) use of oral hypoglycemic drugs: Secondary | ICD-10-CM | POA: Diagnosis not present

## 2019-12-23 DIAGNOSIS — F324 Major depressive disorder, single episode, in partial remission: Secondary | ICD-10-CM | POA: Diagnosis not present

## 2019-12-23 DIAGNOSIS — G894 Chronic pain syndrome: Secondary | ICD-10-CM | POA: Diagnosis not present

## 2019-12-23 DIAGNOSIS — E7800 Pure hypercholesterolemia, unspecified: Secondary | ICD-10-CM | POA: Diagnosis not present

## 2019-12-23 DIAGNOSIS — G4733 Obstructive sleep apnea (adult) (pediatric): Secondary | ICD-10-CM | POA: Diagnosis not present

## 2019-12-23 DIAGNOSIS — E042 Nontoxic multinodular goiter: Secondary | ICD-10-CM | POA: Diagnosis not present

## 2019-12-23 DIAGNOSIS — M1711 Unilateral primary osteoarthritis, right knee: Secondary | ICD-10-CM | POA: Diagnosis not present

## 2019-12-23 DIAGNOSIS — E785 Hyperlipidemia, unspecified: Secondary | ICD-10-CM | POA: Diagnosis not present

## 2019-12-23 DIAGNOSIS — F419 Anxiety disorder, unspecified: Secondary | ICD-10-CM | POA: Diagnosis not present

## 2019-12-23 DIAGNOSIS — N200 Calculus of kidney: Secondary | ICD-10-CM | POA: Diagnosis not present

## 2019-12-23 DIAGNOSIS — Z9981 Dependence on supplemental oxygen: Secondary | ICD-10-CM | POA: Diagnosis not present

## 2019-12-23 DIAGNOSIS — M5442 Lumbago with sciatica, left side: Secondary | ICD-10-CM | POA: Diagnosis not present

## 2019-12-23 DIAGNOSIS — R7303 Prediabetes: Secondary | ICD-10-CM | POA: Diagnosis not present

## 2019-12-23 DIAGNOSIS — R5383 Other fatigue: Secondary | ICD-10-CM | POA: Diagnosis not present

## 2019-12-23 DIAGNOSIS — I5032 Chronic diastolic (congestive) heart failure: Secondary | ICD-10-CM | POA: Diagnosis not present

## 2019-12-23 DIAGNOSIS — Z515 Encounter for palliative care: Secondary | ICD-10-CM | POA: Diagnosis not present

## 2019-12-23 DIAGNOSIS — E113291 Type 2 diabetes mellitus with mild nonproliferative diabetic retinopathy without macular edema, right eye: Secondary | ICD-10-CM | POA: Diagnosis not present

## 2019-12-23 DIAGNOSIS — F424 Excoriation (skin-picking) disorder: Secondary | ICD-10-CM | POA: Diagnosis not present

## 2019-12-23 DIAGNOSIS — Z0000 Encounter for general adult medical examination without abnormal findings: Secondary | ICD-10-CM | POA: Diagnosis not present

## 2019-12-23 DIAGNOSIS — I722 Aneurysm of renal artery: Secondary | ICD-10-CM | POA: Diagnosis not present

## 2019-12-23 DIAGNOSIS — E1169 Type 2 diabetes mellitus with other specified complication: Secondary | ICD-10-CM | POA: Diagnosis not present

## 2019-12-23 DIAGNOSIS — M955 Acquired deformity of pelvis: Secondary | ICD-10-CM | POA: Diagnosis not present

## 2019-12-23 DIAGNOSIS — N401 Enlarged prostate with lower urinary tract symptoms: Secondary | ICD-10-CM | POA: Diagnosis not present

## 2019-12-23 DIAGNOSIS — E211 Secondary hyperparathyroidism, not elsewhere classified: Secondary | ICD-10-CM | POA: Diagnosis not present

## 2019-12-23 DIAGNOSIS — I480 Paroxysmal atrial fibrillation: Secondary | ICD-10-CM | POA: Diagnosis not present

## 2019-12-23 DIAGNOSIS — Z03818 Encounter for observation for suspected exposure to other biological agents ruled out: Secondary | ICD-10-CM | POA: Diagnosis not present

## 2019-12-23 DIAGNOSIS — Z6831 Body mass index (BMI) 31.0-31.9, adult: Secondary | ICD-10-CM | POA: Diagnosis not present

## 2019-12-23 DIAGNOSIS — R5381 Other malaise: Secondary | ICD-10-CM | POA: Diagnosis not present

## 2019-12-23 DIAGNOSIS — N390 Urinary tract infection, site not specified: Secondary | ICD-10-CM | POA: Diagnosis not present

## 2019-12-23 DIAGNOSIS — H2513 Age-related nuclear cataract, bilateral: Secondary | ICD-10-CM | POA: Diagnosis not present

## 2019-12-23 DIAGNOSIS — I639 Cerebral infarction, unspecified: Secondary | ICD-10-CM | POA: Diagnosis not present

## 2019-12-23 DIAGNOSIS — J439 Emphysema, unspecified: Secondary | ICD-10-CM | POA: Diagnosis not present

## 2019-12-23 DIAGNOSIS — C250 Malignant neoplasm of head of pancreas: Secondary | ICD-10-CM | POA: Diagnosis not present

## 2019-12-23 DIAGNOSIS — G3184 Mild cognitive impairment, so stated: Secondary | ICD-10-CM | POA: Diagnosis not present

## 2019-12-23 DIAGNOSIS — F411 Generalized anxiety disorder: Secondary | ICD-10-CM | POA: Diagnosis not present

## 2019-12-23 DIAGNOSIS — R399 Unspecified symptoms and signs involving the genitourinary system: Secondary | ICD-10-CM | POA: Diagnosis not present

## 2019-12-23 DIAGNOSIS — Z9889 Other specified postprocedural states: Secondary | ICD-10-CM | POA: Diagnosis not present

## 2019-12-23 DIAGNOSIS — H40033 Anatomical narrow angle, bilateral: Secondary | ICD-10-CM | POA: Diagnosis not present

## 2019-12-23 DIAGNOSIS — F338 Other recurrent depressive disorders: Secondary | ICD-10-CM | POA: Diagnosis not present

## 2019-12-23 DIAGNOSIS — E441 Mild protein-calorie malnutrition: Secondary | ICD-10-CM | POA: Diagnosis not present

## 2019-12-23 DIAGNOSIS — I2510 Atherosclerotic heart disease of native coronary artery without angina pectoris: Secondary | ICD-10-CM | POA: Diagnosis not present

## 2019-12-23 DIAGNOSIS — M531 Cervicobrachial syndrome: Secondary | ICD-10-CM | POA: Diagnosis not present

## 2019-12-23 DIAGNOSIS — L97919 Non-pressure chronic ulcer of unspecified part of right lower leg with unspecified severity: Secondary | ICD-10-CM | POA: Diagnosis not present

## 2019-12-23 DIAGNOSIS — C9210 Chronic myeloid leukemia, BCR/ABL-positive, not having achieved remission: Secondary | ICD-10-CM | POA: Diagnosis not present

## 2019-12-23 DIAGNOSIS — Z48812 Encounter for surgical aftercare following surgery on the circulatory system: Secondary | ICD-10-CM | POA: Diagnosis not present

## 2019-12-23 DIAGNOSIS — M109 Gout, unspecified: Secondary | ICD-10-CM | POA: Diagnosis not present

## 2019-12-23 DIAGNOSIS — Z9181 History of falling: Secondary | ICD-10-CM | POA: Diagnosis not present

## 2019-12-23 DIAGNOSIS — I10 Essential (primary) hypertension: Secondary | ICD-10-CM | POA: Diagnosis not present

## 2019-12-23 DIAGNOSIS — B948 Sequelae of other specified infectious and parasitic diseases: Secondary | ICD-10-CM | POA: Diagnosis not present

## 2019-12-23 DIAGNOSIS — R809 Proteinuria, unspecified: Secondary | ICD-10-CM | POA: Diagnosis not present

## 2019-12-23 DIAGNOSIS — E034 Atrophy of thyroid (acquired): Secondary | ICD-10-CM | POA: Diagnosis not present

## 2019-12-23 DIAGNOSIS — Z79891 Long term (current) use of opiate analgesic: Secondary | ICD-10-CM | POA: Diagnosis not present

## 2019-12-23 DIAGNOSIS — Z8673 Personal history of transient ischemic attack (TIA), and cerebral infarction without residual deficits: Secondary | ICD-10-CM | POA: Diagnosis not present

## 2019-12-23 DIAGNOSIS — F0281 Dementia in other diseases classified elsewhere with behavioral disturbance: Secondary | ICD-10-CM | POA: Diagnosis not present

## 2019-12-23 DIAGNOSIS — K219 Gastro-esophageal reflux disease without esophagitis: Secondary | ICD-10-CM | POA: Diagnosis not present

## 2019-12-23 DIAGNOSIS — Z20822 Contact with and (suspected) exposure to covid-19: Secondary | ICD-10-CM | POA: Diagnosis not present

## 2019-12-23 DIAGNOSIS — Z79899 Other long term (current) drug therapy: Secondary | ICD-10-CM | POA: Diagnosis not present

## 2019-12-23 DIAGNOSIS — R6889 Other general symptoms and signs: Secondary | ICD-10-CM | POA: Diagnosis not present

## 2019-12-23 DIAGNOSIS — N184 Chronic kidney disease, stage 4 (severe): Secondary | ICD-10-CM | POA: Diagnosis not present

## 2019-12-23 DIAGNOSIS — E1121 Type 2 diabetes mellitus with diabetic nephropathy: Secondary | ICD-10-CM | POA: Diagnosis not present

## 2019-12-23 DIAGNOSIS — R739 Hyperglycemia, unspecified: Secondary | ICD-10-CM | POA: Diagnosis not present

## 2019-12-23 DIAGNOSIS — E854 Organ-limited amyloidosis: Secondary | ICD-10-CM | POA: Diagnosis not present

## 2019-12-23 DIAGNOSIS — E538 Deficiency of other specified B group vitamins: Secondary | ICD-10-CM | POA: Diagnosis not present

## 2019-12-23 DIAGNOSIS — I700 Atherosclerosis of aorta: Secondary | ICD-10-CM | POA: Diagnosis not present

## 2019-12-23 DIAGNOSIS — E559 Vitamin D deficiency, unspecified: Secondary | ICD-10-CM | POA: Diagnosis not present

## 2019-12-23 DIAGNOSIS — R7302 Impaired glucose tolerance (oral): Secondary | ICD-10-CM | POA: Diagnosis not present

## 2019-12-23 DIAGNOSIS — E8881 Metabolic syndrome: Secondary | ICD-10-CM | POA: Diagnosis not present

## 2019-12-23 DIAGNOSIS — E1122 Type 2 diabetes mellitus with diabetic chronic kidney disease: Secondary | ICD-10-CM | POA: Diagnosis not present

## 2019-12-23 DIAGNOSIS — F319 Bipolar disorder, unspecified: Secondary | ICD-10-CM | POA: Diagnosis not present

## 2019-12-23 DIAGNOSIS — Z1329 Encounter for screening for other suspected endocrine disorder: Secondary | ICD-10-CM | POA: Diagnosis not present

## 2019-12-23 DIAGNOSIS — D631 Anemia in chronic kidney disease: Secondary | ICD-10-CM | POA: Diagnosis not present

## 2019-12-23 DIAGNOSIS — I6529 Occlusion and stenosis of unspecified carotid artery: Secondary | ICD-10-CM | POA: Diagnosis not present

## 2019-12-23 DIAGNOSIS — Z1159 Encounter for screening for other viral diseases: Secondary | ICD-10-CM | POA: Diagnosis not present

## 2019-12-23 DIAGNOSIS — D649 Anemia, unspecified: Secondary | ICD-10-CM | POA: Diagnosis not present

## 2019-12-23 DIAGNOSIS — M47816 Spondylosis without myelopathy or radiculopathy, lumbar region: Secondary | ICD-10-CM | POA: Diagnosis not present

## 2019-12-23 DIAGNOSIS — E119 Type 2 diabetes mellitus without complications: Secondary | ICD-10-CM | POA: Diagnosis not present

## 2019-12-23 DIAGNOSIS — E1165 Type 2 diabetes mellitus with hyperglycemia: Secondary | ICD-10-CM | POA: Diagnosis not present

## 2019-12-23 DIAGNOSIS — N189 Chronic kidney disease, unspecified: Secondary | ICD-10-CM | POA: Diagnosis not present

## 2019-12-23 DIAGNOSIS — R05 Cough: Secondary | ICD-10-CM | POA: Diagnosis not present

## 2019-12-23 DIAGNOSIS — M47812 Spondylosis without myelopathy or radiculopathy, cervical region: Secondary | ICD-10-CM | POA: Diagnosis not present

## 2019-12-23 DIAGNOSIS — N1832 Chronic kidney disease, stage 3b: Secondary | ICD-10-CM | POA: Diagnosis not present

## 2019-12-23 DIAGNOSIS — Z6841 Body Mass Index (BMI) 40.0 and over, adult: Secondary | ICD-10-CM | POA: Diagnosis not present

## 2019-12-23 DIAGNOSIS — R972 Elevated prostate specific antigen [PSA]: Secondary | ICD-10-CM | POA: Diagnosis not present

## 2019-12-23 DIAGNOSIS — E43 Unspecified severe protein-calorie malnutrition: Secondary | ICD-10-CM | POA: Diagnosis not present

## 2019-12-23 DIAGNOSIS — Z136 Encounter for screening for cardiovascular disorders: Secondary | ICD-10-CM | POA: Diagnosis not present

## 2019-12-23 DIAGNOSIS — M353 Polymyalgia rheumatica: Secondary | ICD-10-CM | POA: Diagnosis not present

## 2019-12-23 DIAGNOSIS — E1141 Type 2 diabetes mellitus with diabetic mononeuropathy: Secondary | ICD-10-CM | POA: Diagnosis not present

## 2019-12-23 DIAGNOSIS — M0579 Rheumatoid arthritis with rheumatoid factor of multiple sites without organ or systems involvement: Secondary | ICD-10-CM | POA: Diagnosis not present

## 2019-12-23 DIAGNOSIS — Z1389 Encounter for screening for other disorder: Secondary | ICD-10-CM | POA: Diagnosis not present

## 2019-12-23 DIAGNOSIS — Z45018 Encounter for adjustment and management of other part of cardiac pacemaker: Secondary | ICD-10-CM | POA: Diagnosis not present

## 2019-12-23 DIAGNOSIS — L97929 Non-pressure chronic ulcer of unspecified part of left lower leg with unspecified severity: Secondary | ICD-10-CM | POA: Diagnosis not present

## 2019-12-23 DIAGNOSIS — I2699 Other pulmonary embolism without acute cor pulmonale: Secondary | ICD-10-CM | POA: Diagnosis not present

## 2019-12-23 DIAGNOSIS — J9621 Acute and chronic respiratory failure with hypoxia: Secondary | ICD-10-CM | POA: Diagnosis not present

## 2019-12-23 DIAGNOSIS — R7301 Impaired fasting glucose: Secondary | ICD-10-CM | POA: Diagnosis not present

## 2019-12-23 DIAGNOSIS — N179 Acute kidney failure, unspecified: Secondary | ICD-10-CM | POA: Diagnosis not present

## 2019-12-23 DIAGNOSIS — F329 Major depressive disorder, single episode, unspecified: Secondary | ICD-10-CM | POA: Diagnosis not present

## 2019-12-23 DIAGNOSIS — E038 Other specified hypothyroidism: Secondary | ICD-10-CM | POA: Diagnosis not present

## 2019-12-23 DIAGNOSIS — R899 Unspecified abnormal finding in specimens from other organs, systems and tissues: Secondary | ICD-10-CM | POA: Diagnosis not present

## 2019-12-23 DIAGNOSIS — M170 Bilateral primary osteoarthritis of knee: Secondary | ICD-10-CM | POA: Diagnosis not present

## 2019-12-23 DIAGNOSIS — Z8639 Personal history of other endocrine, nutritional and metabolic disease: Secondary | ICD-10-CM | POA: Diagnosis not present

## 2019-12-23 DIAGNOSIS — E782 Mixed hyperlipidemia: Secondary | ICD-10-CM | POA: Diagnosis not present

## 2019-12-23 DIAGNOSIS — N1330 Unspecified hydronephrosis: Secondary | ICD-10-CM | POA: Diagnosis not present

## 2019-12-23 DIAGNOSIS — I5041 Acute combined systolic (congestive) and diastolic (congestive) heart failure: Secondary | ICD-10-CM | POA: Diagnosis not present

## 2019-12-23 DIAGNOSIS — M1990 Unspecified osteoarthritis, unspecified site: Secondary | ICD-10-CM | POA: Diagnosis not present

## 2019-12-23 DIAGNOSIS — H5704 Mydriasis: Secondary | ICD-10-CM | POA: Diagnosis not present

## 2019-12-23 DIAGNOSIS — I25110 Atherosclerotic heart disease of native coronary artery with unstable angina pectoris: Secondary | ICD-10-CM | POA: Diagnosis not present

## 2019-12-23 DIAGNOSIS — M5136 Other intervertebral disc degeneration, lumbar region: Secondary | ICD-10-CM | POA: Diagnosis not present

## 2019-12-23 DIAGNOSIS — M9901 Segmental and somatic dysfunction of cervical region: Secondary | ICD-10-CM | POA: Diagnosis not present

## 2019-12-23 DIAGNOSIS — J440 Chronic obstructive pulmonary disease with acute lower respiratory infection: Secondary | ICD-10-CM | POA: Diagnosis not present

## 2019-12-23 DIAGNOSIS — J1282 Pneumonia due to coronavirus disease 2019: Secondary | ICD-10-CM | POA: Diagnosis not present

## 2019-12-23 DIAGNOSIS — R635 Abnormal weight gain: Secondary | ICD-10-CM | POA: Diagnosis not present

## 2019-12-23 DIAGNOSIS — E1159 Type 2 diabetes mellitus with other circulatory complications: Secondary | ICD-10-CM | POA: Diagnosis not present

## 2019-12-23 DIAGNOSIS — J45909 Unspecified asthma, uncomplicated: Secondary | ICD-10-CM | POA: Diagnosis not present

## 2019-12-23 DIAGNOSIS — F39 Unspecified mood [affective] disorder: Secondary | ICD-10-CM | POA: Diagnosis not present

## 2019-12-23 DIAGNOSIS — E519 Thiamine deficiency, unspecified: Secondary | ICD-10-CM | POA: Diagnosis not present

## 2019-12-23 DIAGNOSIS — Z87891 Personal history of nicotine dependence: Secondary | ICD-10-CM | POA: Diagnosis not present

## 2019-12-23 DIAGNOSIS — J9601 Acute respiratory failure with hypoxia: Secondary | ICD-10-CM | POA: Diagnosis not present

## 2019-12-23 DIAGNOSIS — E872 Acidosis: Secondary | ICD-10-CM | POA: Diagnosis not present

## 2019-12-23 DIAGNOSIS — R45851 Suicidal ideations: Secondary | ICD-10-CM | POA: Diagnosis not present

## 2019-12-23 DIAGNOSIS — I509 Heart failure, unspecified: Secondary | ICD-10-CM | POA: Diagnosis not present

## 2019-12-23 DIAGNOSIS — J449 Chronic obstructive pulmonary disease, unspecified: Secondary | ICD-10-CM | POA: Diagnosis not present

## 2019-12-23 DIAGNOSIS — N400 Enlarged prostate without lower urinary tract symptoms: Secondary | ICD-10-CM | POA: Diagnosis not present

## 2019-12-23 DIAGNOSIS — E1149 Type 2 diabetes mellitus with other diabetic neurological complication: Secondary | ICD-10-CM | POA: Diagnosis not present

## 2019-12-23 DIAGNOSIS — I4891 Unspecified atrial fibrillation: Secondary | ICD-10-CM | POA: Diagnosis not present

## 2019-12-23 DIAGNOSIS — I472 Ventricular tachycardia: Secondary | ICD-10-CM | POA: Diagnosis not present

## 2019-12-23 DIAGNOSIS — Z125 Encounter for screening for malignant neoplasm of prostate: Secondary | ICD-10-CM | POA: Diagnosis not present

## 2019-12-23 DIAGNOSIS — Z5181 Encounter for therapeutic drug level monitoring: Secondary | ICD-10-CM | POA: Diagnosis not present

## 2019-12-23 DIAGNOSIS — M9905 Segmental and somatic dysfunction of pelvic region: Secondary | ICD-10-CM | POA: Diagnosis not present

## 2019-12-23 DIAGNOSIS — E291 Testicular hypofunction: Secondary | ICD-10-CM | POA: Diagnosis not present

## 2019-12-23 DIAGNOSIS — G8929 Other chronic pain: Secondary | ICD-10-CM | POA: Diagnosis not present

## 2019-12-23 DIAGNOSIS — M5430 Sciatica, unspecified side: Secondary | ICD-10-CM | POA: Diagnosis not present

## 2019-12-23 DIAGNOSIS — I130 Hypertensive heart and chronic kidney disease with heart failure and stage 1 through stage 4 chronic kidney disease, or unspecified chronic kidney disease: Secondary | ICD-10-CM | POA: Diagnosis not present

## 2019-12-23 DIAGNOSIS — D509 Iron deficiency anemia, unspecified: Secondary | ICD-10-CM | POA: Diagnosis not present

## 2019-12-23 DIAGNOSIS — E0821 Diabetes mellitus due to underlying condition with diabetic nephropathy: Secondary | ICD-10-CM | POA: Diagnosis not present

## 2019-12-23 DIAGNOSIS — G629 Polyneuropathy, unspecified: Secondary | ICD-10-CM | POA: Diagnosis not present

## 2019-12-23 DIAGNOSIS — Z6830 Body mass index (BMI) 30.0-30.9, adult: Secondary | ICD-10-CM | POA: Diagnosis not present

## 2019-12-23 DIAGNOSIS — S32509D Unspecified fracture of unspecified pubis, subsequent encounter for fracture with routine healing: Secondary | ICD-10-CM | POA: Diagnosis not present

## 2019-12-23 DIAGNOSIS — R531 Weakness: Secondary | ICD-10-CM | POA: Diagnosis not present

## 2019-12-23 DIAGNOSIS — F321 Major depressive disorder, single episode, moderate: Secondary | ICD-10-CM | POA: Diagnosis not present

## 2019-12-23 DIAGNOSIS — E063 Autoimmune thyroiditis: Secondary | ICD-10-CM | POA: Diagnosis not present

## 2019-12-23 DIAGNOSIS — M794 Hypertrophy of (infrapatellar) fat pad: Secondary | ICD-10-CM | POA: Diagnosis not present

## 2019-12-23 DIAGNOSIS — L570 Actinic keratosis: Secondary | ICD-10-CM | POA: Diagnosis not present

## 2019-12-23 DIAGNOSIS — R002 Palpitations: Secondary | ICD-10-CM | POA: Diagnosis not present

## 2019-12-23 DIAGNOSIS — I5031 Acute diastolic (congestive) heart failure: Secondary | ICD-10-CM | POA: Diagnosis not present

## 2019-12-23 DIAGNOSIS — Z9114 Patient's other noncompliance with medication regimen: Secondary | ICD-10-CM | POA: Diagnosis not present

## 2019-12-23 DIAGNOSIS — M4800 Spinal stenosis, site unspecified: Secondary | ICD-10-CM | POA: Diagnosis not present

## 2019-12-23 DIAGNOSIS — K439 Ventral hernia without obstruction or gangrene: Secondary | ICD-10-CM | POA: Diagnosis not present

## 2019-12-23 DIAGNOSIS — Z20828 Contact with and (suspected) exposure to other viral communicable diseases: Secondary | ICD-10-CM | POA: Diagnosis not present

## 2019-12-23 DIAGNOSIS — E7849 Other hyperlipidemia: Secondary | ICD-10-CM | POA: Diagnosis not present

## 2019-12-23 DIAGNOSIS — R64 Cachexia: Secondary | ICD-10-CM | POA: Diagnosis not present

## 2019-12-23 DIAGNOSIS — E118 Type 2 diabetes mellitus with unspecified complications: Secondary | ICD-10-CM | POA: Diagnosis not present

## 2019-12-23 DIAGNOSIS — Z66 Do not resuscitate: Secondary | ICD-10-CM | POA: Diagnosis not present

## 2019-12-23 DIAGNOSIS — R519 Headache, unspecified: Secondary | ICD-10-CM | POA: Diagnosis not present

## 2019-12-23 DIAGNOSIS — M19019 Primary osteoarthritis, unspecified shoulder: Secondary | ICD-10-CM | POA: Diagnosis not present

## 2019-12-23 DIAGNOSIS — M2550 Pain in unspecified joint: Secondary | ICD-10-CM | POA: Diagnosis not present

## 2019-12-23 DIAGNOSIS — R079 Chest pain, unspecified: Secondary | ICD-10-CM | POA: Diagnosis not present

## 2019-12-23 DIAGNOSIS — Z7902 Long term (current) use of antithrombotics/antiplatelets: Secondary | ICD-10-CM | POA: Diagnosis not present

## 2019-12-23 DIAGNOSIS — I482 Chronic atrial fibrillation, unspecified: Secondary | ICD-10-CM | POA: Diagnosis not present

## 2019-12-23 DIAGNOSIS — N183 Chronic kidney disease, stage 3 unspecified: Secondary | ICD-10-CM | POA: Diagnosis not present

## 2019-12-23 DIAGNOSIS — U071 COVID-19: Secondary | ICD-10-CM | POA: Diagnosis not present

## 2019-12-23 DIAGNOSIS — N170 Acute kidney failure with tubular necrosis: Secondary | ICD-10-CM | POA: Diagnosis not present

## 2019-12-23 DIAGNOSIS — J8283 Eosinophilic asthma: Secondary | ICD-10-CM | POA: Diagnosis not present

## 2019-12-23 DIAGNOSIS — C9000 Multiple myeloma not having achieved remission: Secondary | ICD-10-CM | POA: Diagnosis not present

## 2019-12-23 DIAGNOSIS — L03116 Cellulitis of left lower limb: Secondary | ICD-10-CM | POA: Diagnosis not present

## 2019-12-23 DIAGNOSIS — Z13220 Encounter for screening for lipoid disorders: Secondary | ICD-10-CM | POA: Diagnosis not present

## 2019-12-23 DIAGNOSIS — E669 Obesity, unspecified: Secondary | ICD-10-CM | POA: Diagnosis not present

## 2019-12-23 DIAGNOSIS — N1831 Chronic kidney disease, stage 3a: Secondary | ICD-10-CM | POA: Diagnosis not present

## 2019-12-24 DIAGNOSIS — E039 Hypothyroidism, unspecified: Secondary | ICD-10-CM | POA: Diagnosis not present

## 2019-12-24 DIAGNOSIS — R5383 Other fatigue: Secondary | ICD-10-CM | POA: Diagnosis not present

## 2019-12-24 DIAGNOSIS — N390 Urinary tract infection, site not specified: Secondary | ICD-10-CM | POA: Diagnosis not present

## 2019-12-24 DIAGNOSIS — N400 Enlarged prostate without lower urinary tract symptoms: Secondary | ICD-10-CM | POA: Diagnosis not present

## 2019-12-24 DIAGNOSIS — G4733 Obstructive sleep apnea (adult) (pediatric): Secondary | ICD-10-CM | POA: Diagnosis not present

## 2019-12-24 DIAGNOSIS — R82998 Other abnormal findings in urine: Secondary | ICD-10-CM | POA: Diagnosis not present

## 2019-12-24 DIAGNOSIS — M79601 Pain in right arm: Secondary | ICD-10-CM | POA: Diagnosis not present

## 2019-12-24 DIAGNOSIS — F419 Anxiety disorder, unspecified: Secondary | ICD-10-CM | POA: Diagnosis not present

## 2019-12-24 DIAGNOSIS — I4891 Unspecified atrial fibrillation: Secondary | ICD-10-CM | POA: Diagnosis not present

## 2019-12-24 DIAGNOSIS — E119 Type 2 diabetes mellitus without complications: Secondary | ICD-10-CM | POA: Diagnosis not present

## 2019-12-24 DIAGNOSIS — I2699 Other pulmonary embolism without acute cor pulmonale: Secondary | ICD-10-CM | POA: Diagnosis not present

## 2019-12-24 DIAGNOSIS — J9601 Acute respiratory failure with hypoxia: Secondary | ICD-10-CM | POA: Diagnosis not present

## 2019-12-24 DIAGNOSIS — G255 Other chorea: Secondary | ICD-10-CM | POA: Diagnosis not present

## 2019-12-24 DIAGNOSIS — Z6820 Body mass index (BMI) 20.0-20.9, adult: Secondary | ICD-10-CM | POA: Diagnosis not present

## 2019-12-24 DIAGNOSIS — R7303 Prediabetes: Secondary | ICD-10-CM | POA: Diagnosis not present

## 2019-12-24 DIAGNOSIS — I129 Hypertensive chronic kidney disease with stage 1 through stage 4 chronic kidney disease, or unspecified chronic kidney disease: Secondary | ICD-10-CM | POA: Diagnosis not present

## 2019-12-24 DIAGNOSIS — E7800 Pure hypercholesterolemia, unspecified: Secondary | ICD-10-CM | POA: Diagnosis not present

## 2019-12-24 DIAGNOSIS — I2510 Atherosclerotic heart disease of native coronary artery without angina pectoris: Secondary | ICD-10-CM | POA: Diagnosis not present

## 2019-12-24 DIAGNOSIS — G894 Chronic pain syndrome: Secondary | ICD-10-CM | POA: Diagnosis not present

## 2019-12-24 DIAGNOSIS — L309 Dermatitis, unspecified: Secondary | ICD-10-CM | POA: Diagnosis not present

## 2019-12-24 DIAGNOSIS — E1122 Type 2 diabetes mellitus with diabetic chronic kidney disease: Secondary | ICD-10-CM | POA: Diagnosis not present

## 2019-12-24 DIAGNOSIS — Z0189 Encounter for other specified special examinations: Secondary | ICD-10-CM | POA: Diagnosis not present

## 2019-12-24 DIAGNOSIS — N189 Chronic kidney disease, unspecified: Secondary | ICD-10-CM | POA: Diagnosis not present

## 2019-12-24 DIAGNOSIS — Z48815 Encounter for surgical aftercare following surgery on the digestive system: Secondary | ICD-10-CM | POA: Diagnosis not present

## 2019-12-24 DIAGNOSIS — D649 Anemia, unspecified: Secondary | ICD-10-CM | POA: Diagnosis not present

## 2019-12-24 DIAGNOSIS — Z20828 Contact with and (suspected) exposure to other viral communicable diseases: Secondary | ICD-10-CM | POA: Diagnosis not present

## 2019-12-24 DIAGNOSIS — I272 Pulmonary hypertension, unspecified: Secondary | ICD-10-CM | POA: Diagnosis not present

## 2019-12-24 DIAGNOSIS — Z7984 Long term (current) use of oral hypoglycemic drugs: Secondary | ICD-10-CM | POA: Diagnosis not present

## 2019-12-24 DIAGNOSIS — Z125 Encounter for screening for malignant neoplasm of prostate: Secondary | ICD-10-CM | POA: Diagnosis not present

## 2019-12-24 DIAGNOSIS — M150 Primary generalized (osteo)arthritis: Secondary | ICD-10-CM | POA: Diagnosis not present

## 2019-12-24 DIAGNOSIS — J449 Chronic obstructive pulmonary disease, unspecified: Secondary | ICD-10-CM | POA: Diagnosis not present

## 2019-12-24 DIAGNOSIS — U071 COVID-19: Secondary | ICD-10-CM | POA: Diagnosis not present

## 2019-12-24 DIAGNOSIS — I110 Hypertensive heart disease with heart failure: Secondary | ICD-10-CM | POA: Diagnosis not present

## 2019-12-24 DIAGNOSIS — D509 Iron deficiency anemia, unspecified: Secondary | ICD-10-CM | POA: Diagnosis not present

## 2019-12-24 DIAGNOSIS — Z7901 Long term (current) use of anticoagulants: Secondary | ICD-10-CM | POA: Diagnosis not present

## 2019-12-24 DIAGNOSIS — I480 Paroxysmal atrial fibrillation: Secondary | ICD-10-CM | POA: Diagnosis not present

## 2019-12-24 DIAGNOSIS — E1142 Type 2 diabetes mellitus with diabetic polyneuropathy: Secondary | ICD-10-CM | POA: Diagnosis not present

## 2019-12-24 DIAGNOSIS — R42 Dizziness and giddiness: Secondary | ICD-10-CM | POA: Diagnosis not present

## 2019-12-24 DIAGNOSIS — C50412 Malignant neoplasm of upper-outer quadrant of left female breast: Secondary | ICD-10-CM | POA: Diagnosis not present

## 2019-12-24 DIAGNOSIS — B9681 Helicobacter pylori [H. pylori] as the cause of diseases classified elsewhere: Secondary | ICD-10-CM | POA: Diagnosis not present

## 2019-12-24 DIAGNOSIS — H40033 Anatomical narrow angle, bilateral: Secondary | ICD-10-CM | POA: Diagnosis not present

## 2019-12-24 DIAGNOSIS — Z0000 Encounter for general adult medical examination without abnormal findings: Secondary | ICD-10-CM | POA: Diagnosis not present

## 2019-12-24 DIAGNOSIS — E1151 Type 2 diabetes mellitus with diabetic peripheral angiopathy without gangrene: Secondary | ICD-10-CM | POA: Diagnosis not present

## 2019-12-24 DIAGNOSIS — M1990 Unspecified osteoarthritis, unspecified site: Secondary | ICD-10-CM | POA: Diagnosis not present

## 2019-12-24 DIAGNOSIS — Z03818 Encounter for observation for suspected exposure to other biological agents ruled out: Secondary | ICD-10-CM | POA: Diagnosis not present

## 2019-12-24 DIAGNOSIS — Z933 Colostomy status: Secondary | ICD-10-CM | POA: Diagnosis not present

## 2019-12-24 DIAGNOSIS — S8265XA Nondisplaced fracture of lateral malleolus of left fibula, initial encounter for closed fracture: Secondary | ICD-10-CM | POA: Diagnosis not present

## 2019-12-24 DIAGNOSIS — G309 Alzheimer's disease, unspecified: Secondary | ICD-10-CM | POA: Diagnosis not present

## 2019-12-24 DIAGNOSIS — M0570 Rheumatoid arthritis with rheumatoid factor of unspecified site without organ or systems involvement: Secondary | ICD-10-CM | POA: Diagnosis not present

## 2019-12-24 DIAGNOSIS — E782 Mixed hyperlipidemia: Secondary | ICD-10-CM | POA: Diagnosis not present

## 2019-12-24 DIAGNOSIS — I4819 Other persistent atrial fibrillation: Secondary | ICD-10-CM | POA: Diagnosis not present

## 2019-12-24 DIAGNOSIS — F329 Major depressive disorder, single episode, unspecified: Secondary | ICD-10-CM | POA: Diagnosis not present

## 2019-12-24 DIAGNOSIS — F324 Major depressive disorder, single episode, in partial remission: Secondary | ICD-10-CM | POA: Diagnosis not present

## 2019-12-24 DIAGNOSIS — E109 Type 1 diabetes mellitus without complications: Secondary | ICD-10-CM | POA: Diagnosis not present

## 2019-12-24 DIAGNOSIS — Z23 Encounter for immunization: Secondary | ICD-10-CM | POA: Diagnosis not present

## 2019-12-24 DIAGNOSIS — K5732 Diverticulitis of large intestine without perforation or abscess without bleeding: Secondary | ICD-10-CM | POA: Diagnosis not present

## 2019-12-24 DIAGNOSIS — I340 Nonrheumatic mitral (valve) insufficiency: Secondary | ICD-10-CM | POA: Diagnosis not present

## 2019-12-24 DIAGNOSIS — M810 Age-related osteoporosis without current pathological fracture: Secondary | ICD-10-CM | POA: Diagnosis not present

## 2019-12-24 DIAGNOSIS — I482 Chronic atrial fibrillation, unspecified: Secondary | ICD-10-CM | POA: Diagnosis not present

## 2019-12-24 DIAGNOSIS — Z87891 Personal history of nicotine dependence: Secondary | ICD-10-CM | POA: Diagnosis not present

## 2019-12-24 DIAGNOSIS — I69351 Hemiplegia and hemiparesis following cerebral infarction affecting right dominant side: Secondary | ICD-10-CM | POA: Diagnosis not present

## 2019-12-24 DIAGNOSIS — I25118 Atherosclerotic heart disease of native coronary artery with other forms of angina pectoris: Secondary | ICD-10-CM | POA: Diagnosis not present

## 2019-12-24 DIAGNOSIS — I701 Atherosclerosis of renal artery: Secondary | ICD-10-CM | POA: Diagnosis not present

## 2019-12-24 DIAGNOSIS — N184 Chronic kidney disease, stage 4 (severe): Secondary | ICD-10-CM | POA: Diagnosis not present

## 2019-12-24 DIAGNOSIS — K50014 Crohn's disease of small intestine with abscess: Secondary | ICD-10-CM | POA: Diagnosis not present

## 2019-12-24 DIAGNOSIS — D6481 Anemia due to antineoplastic chemotherapy: Secondary | ICD-10-CM | POA: Diagnosis not present

## 2019-12-24 DIAGNOSIS — I509 Heart failure, unspecified: Secondary | ICD-10-CM | POA: Diagnosis not present

## 2019-12-24 DIAGNOSIS — K2270 Barrett's esophagus without dysplasia: Secondary | ICD-10-CM | POA: Diagnosis not present

## 2019-12-24 DIAGNOSIS — R471 Dysarthria and anarthria: Secondary | ICD-10-CM | POA: Diagnosis not present

## 2019-12-24 DIAGNOSIS — R52 Pain, unspecified: Secondary | ICD-10-CM | POA: Diagnosis not present

## 2019-12-24 DIAGNOSIS — R140 Abdominal distension (gaseous): Secondary | ICD-10-CM | POA: Diagnosis not present

## 2019-12-24 DIAGNOSIS — R1084 Generalized abdominal pain: Secondary | ICD-10-CM | POA: Diagnosis not present

## 2019-12-24 DIAGNOSIS — R079 Chest pain, unspecified: Secondary | ICD-10-CM | POA: Diagnosis not present

## 2019-12-24 DIAGNOSIS — Z8616 Personal history of COVID-19: Secondary | ICD-10-CM | POA: Diagnosis not present

## 2019-12-24 DIAGNOSIS — I10 Essential (primary) hypertension: Secondary | ICD-10-CM | POA: Diagnosis not present

## 2019-12-24 DIAGNOSIS — S0083XA Contusion of other part of head, initial encounter: Secondary | ICD-10-CM | POA: Diagnosis not present

## 2019-12-24 DIAGNOSIS — N521 Erectile dysfunction due to diseases classified elsewhere: Secondary | ICD-10-CM | POA: Diagnosis not present

## 2019-12-24 DIAGNOSIS — K219 Gastro-esophageal reflux disease without esophagitis: Secondary | ICD-10-CM | POA: Diagnosis not present

## 2019-12-24 DIAGNOSIS — D6959 Other secondary thrombocytopenia: Secondary | ICD-10-CM | POA: Diagnosis not present

## 2019-12-24 DIAGNOSIS — E785 Hyperlipidemia, unspecified: Secondary | ICD-10-CM | POA: Diagnosis not present

## 2019-12-24 DIAGNOSIS — R002 Palpitations: Secondary | ICD-10-CM | POA: Diagnosis not present

## 2019-12-24 DIAGNOSIS — Z79899 Other long term (current) drug therapy: Secondary | ICD-10-CM | POA: Diagnosis not present

## 2019-12-24 DIAGNOSIS — N401 Enlarged prostate with lower urinary tract symptoms: Secondary | ICD-10-CM | POA: Diagnosis not present

## 2019-12-24 DIAGNOSIS — J4520 Mild intermittent asthma, uncomplicated: Secondary | ICD-10-CM | POA: Diagnosis not present

## 2019-12-24 DIAGNOSIS — N179 Acute kidney failure, unspecified: Secondary | ICD-10-CM | POA: Diagnosis not present

## 2019-12-24 DIAGNOSIS — F0280 Dementia in other diseases classified elsewhere without behavioral disturbance: Secondary | ICD-10-CM | POA: Diagnosis not present

## 2019-12-24 DIAGNOSIS — N182 Chronic kidney disease, stage 2 (mild): Secondary | ICD-10-CM | POA: Diagnosis not present

## 2019-12-24 DIAGNOSIS — C679 Malignant neoplasm of bladder, unspecified: Secondary | ICD-10-CM | POA: Diagnosis not present

## 2019-12-24 DIAGNOSIS — M25562 Pain in left knee: Secondary | ICD-10-CM | POA: Diagnosis not present

## 2019-12-25 DIAGNOSIS — M4800 Spinal stenosis, site unspecified: Secondary | ICD-10-CM | POA: Diagnosis not present

## 2019-12-25 DIAGNOSIS — E876 Hypokalemia: Secondary | ICD-10-CM | POA: Diagnosis not present

## 2019-12-25 DIAGNOSIS — I428 Other cardiomyopathies: Secondary | ICD-10-CM | POA: Diagnosis not present

## 2019-12-25 DIAGNOSIS — I495 Sick sinus syndrome: Secondary | ICD-10-CM | POA: Diagnosis not present

## 2019-12-25 DIAGNOSIS — E039 Hypothyroidism, unspecified: Secondary | ICD-10-CM | POA: Diagnosis not present

## 2019-12-25 DIAGNOSIS — N319 Neuromuscular dysfunction of bladder, unspecified: Secondary | ICD-10-CM | POA: Diagnosis not present

## 2019-12-25 DIAGNOSIS — I2699 Other pulmonary embolism without acute cor pulmonale: Secondary | ICD-10-CM | POA: Diagnosis not present

## 2019-12-25 DIAGNOSIS — M1711 Unilateral primary osteoarthritis, right knee: Secondary | ICD-10-CM | POA: Diagnosis not present

## 2019-12-25 DIAGNOSIS — Z4801 Encounter for change or removal of surgical wound dressing: Secondary | ICD-10-CM | POA: Diagnosis not present

## 2019-12-25 DIAGNOSIS — M545 Low back pain: Secondary | ICD-10-CM | POA: Diagnosis not present

## 2019-12-25 DIAGNOSIS — D649 Anemia, unspecified: Secondary | ICD-10-CM | POA: Diagnosis not present

## 2019-12-25 DIAGNOSIS — E46 Unspecified protein-calorie malnutrition: Secondary | ICD-10-CM | POA: Diagnosis not present

## 2019-12-25 DIAGNOSIS — R7401 Elevation of levels of liver transaminase levels: Secondary | ICD-10-CM | POA: Diagnosis not present

## 2019-12-25 DIAGNOSIS — E538 Deficiency of other specified B group vitamins: Secondary | ICD-10-CM | POA: Diagnosis not present

## 2019-12-25 DIAGNOSIS — I110 Hypertensive heart disease with heart failure: Secondary | ICD-10-CM | POA: Diagnosis not present

## 2019-12-25 DIAGNOSIS — J849 Interstitial pulmonary disease, unspecified: Secondary | ICD-10-CM | POA: Diagnosis not present

## 2019-12-25 DIAGNOSIS — L03116 Cellulitis of left lower limb: Secondary | ICD-10-CM | POA: Diagnosis not present

## 2019-12-25 DIAGNOSIS — S42111D Displaced fracture of body of scapula, right shoulder, subsequent encounter for fracture with routine healing: Secondary | ICD-10-CM | POA: Diagnosis not present

## 2019-12-25 DIAGNOSIS — R7989 Other specified abnormal findings of blood chemistry: Secondary | ICD-10-CM | POA: Diagnosis not present

## 2019-12-25 DIAGNOSIS — G8222 Paraplegia, incomplete: Secondary | ICD-10-CM | POA: Diagnosis not present

## 2019-12-25 DIAGNOSIS — J9601 Acute respiratory failure with hypoxia: Secondary | ICD-10-CM | POA: Diagnosis not present

## 2019-12-25 DIAGNOSIS — U071 COVID-19: Secondary | ICD-10-CM | POA: Diagnosis not present

## 2019-12-25 DIAGNOSIS — N179 Acute kidney failure, unspecified: Secondary | ICD-10-CM | POA: Diagnosis not present

## 2019-12-25 DIAGNOSIS — F330 Major depressive disorder, recurrent, mild: Secondary | ICD-10-CM | POA: Diagnosis not present

## 2019-12-25 DIAGNOSIS — G8929 Other chronic pain: Secondary | ICD-10-CM | POA: Diagnosis not present

## 2019-12-25 DIAGNOSIS — I2510 Atherosclerotic heart disease of native coronary artery without angina pectoris: Secondary | ICD-10-CM | POA: Diagnosis not present

## 2019-12-25 DIAGNOSIS — Z4789 Encounter for other orthopedic aftercare: Secondary | ICD-10-CM | POA: Diagnosis not present

## 2019-12-25 DIAGNOSIS — S81802D Unspecified open wound, left lower leg, subsequent encounter: Secondary | ICD-10-CM | POA: Diagnosis not present

## 2019-12-25 DIAGNOSIS — I10 Essential (primary) hypertension: Secondary | ICD-10-CM | POA: Diagnosis not present

## 2019-12-25 DIAGNOSIS — Z79899 Other long term (current) drug therapy: Secondary | ICD-10-CM | POA: Diagnosis not present

## 2019-12-25 DIAGNOSIS — E559 Vitamin D deficiency, unspecified: Secondary | ICD-10-CM | POA: Diagnosis not present

## 2019-12-25 DIAGNOSIS — G959 Disease of spinal cord, unspecified: Secondary | ICD-10-CM | POA: Diagnosis not present

## 2019-12-25 DIAGNOSIS — I5022 Chronic systolic (congestive) heart failure: Secondary | ICD-10-CM | POA: Diagnosis not present

## 2019-12-25 DIAGNOSIS — E871 Hypo-osmolality and hyponatremia: Secondary | ICD-10-CM | POA: Diagnosis not present

## 2019-12-25 DIAGNOSIS — D62 Acute posthemorrhagic anemia: Secondary | ICD-10-CM | POA: Diagnosis not present

## 2019-12-25 DIAGNOSIS — J449 Chronic obstructive pulmonary disease, unspecified: Secondary | ICD-10-CM | POA: Diagnosis not present

## 2019-12-25 DIAGNOSIS — I4821 Permanent atrial fibrillation: Secondary | ICD-10-CM | POA: Diagnosis not present

## 2019-12-26 DIAGNOSIS — I340 Nonrheumatic mitral (valve) insufficiency: Secondary | ICD-10-CM | POA: Diagnosis not present

## 2019-12-26 DIAGNOSIS — R002 Palpitations: Secondary | ICD-10-CM | POA: Diagnosis not present

## 2019-12-26 DIAGNOSIS — M545 Low back pain: Secondary | ICD-10-CM | POA: Diagnosis not present

## 2019-12-26 DIAGNOSIS — I2699 Other pulmonary embolism without acute cor pulmonale: Secondary | ICD-10-CM | POA: Diagnosis not present

## 2019-12-26 DIAGNOSIS — R5383 Other fatigue: Secondary | ICD-10-CM | POA: Diagnosis not present

## 2019-12-26 DIAGNOSIS — Z743 Need for continuous supervision: Secondary | ICD-10-CM | POA: Diagnosis not present

## 2019-12-26 DIAGNOSIS — G8252 Quadriplegia, C1-C4 incomplete: Secondary | ICD-10-CM | POA: Diagnosis not present

## 2019-12-26 DIAGNOSIS — I4821 Permanent atrial fibrillation: Secondary | ICD-10-CM | POA: Diagnosis not present

## 2019-12-26 DIAGNOSIS — W19XXXA Unspecified fall, initial encounter: Secondary | ICD-10-CM | POA: Diagnosis not present

## 2019-12-26 DIAGNOSIS — D510 Vitamin B12 deficiency anemia due to intrinsic factor deficiency: Secondary | ICD-10-CM | POA: Diagnosis not present

## 2019-12-26 DIAGNOSIS — E039 Hypothyroidism, unspecified: Secondary | ICD-10-CM | POA: Diagnosis not present

## 2019-12-26 DIAGNOSIS — E871 Hypo-osmolality and hyponatremia: Secondary | ICD-10-CM | POA: Diagnosis not present

## 2019-12-26 DIAGNOSIS — I5021 Acute systolic (congestive) heart failure: Secondary | ICD-10-CM | POA: Diagnosis not present

## 2019-12-26 DIAGNOSIS — I499 Cardiac arrhythmia, unspecified: Secondary | ICD-10-CM | POA: Diagnosis not present

## 2019-12-26 DIAGNOSIS — Z79899 Other long term (current) drug therapy: Secondary | ICD-10-CM | POA: Diagnosis not present

## 2019-12-26 DIAGNOSIS — R05 Cough: Secondary | ICD-10-CM | POA: Diagnosis not present

## 2019-12-26 DIAGNOSIS — S14102S Unspecified injury at C2 level of cervical spinal cord, sequela: Secondary | ICD-10-CM | POA: Diagnosis not present

## 2019-12-26 DIAGNOSIS — R55 Syncope and collapse: Secondary | ICD-10-CM | POA: Diagnosis not present

## 2019-12-26 DIAGNOSIS — R0600 Dyspnea, unspecified: Secondary | ICD-10-CM | POA: Diagnosis not present

## 2019-12-26 DIAGNOSIS — K5904 Chronic idiopathic constipation: Secondary | ICD-10-CM | POA: Diagnosis not present

## 2019-12-26 DIAGNOSIS — Z9181 History of falling: Secondary | ICD-10-CM | POA: Diagnosis not present

## 2019-12-26 DIAGNOSIS — U071 COVID-19: Secondary | ICD-10-CM | POA: Diagnosis not present

## 2019-12-26 DIAGNOSIS — R279 Unspecified lack of coordination: Secondary | ICD-10-CM | POA: Diagnosis not present

## 2019-12-26 DIAGNOSIS — Z466 Encounter for fitting and adjustment of urinary device: Secondary | ICD-10-CM | POA: Diagnosis not present

## 2019-12-26 DIAGNOSIS — R7989 Other specified abnormal findings of blood chemistry: Secondary | ICD-10-CM | POA: Diagnosis not present

## 2019-12-26 DIAGNOSIS — Z993 Dependence on wheelchair: Secondary | ICD-10-CM | POA: Diagnosis not present

## 2019-12-26 DIAGNOSIS — I420 Dilated cardiomyopathy: Secondary | ICD-10-CM | POA: Diagnosis not present

## 2019-12-26 DIAGNOSIS — N179 Acute kidney failure, unspecified: Secondary | ICD-10-CM | POA: Diagnosis not present

## 2019-12-26 DIAGNOSIS — Z7952 Long term (current) use of systemic steroids: Secondary | ICD-10-CM | POA: Diagnosis not present

## 2019-12-26 DIAGNOSIS — H6591 Unspecified nonsuppurative otitis media, right ear: Secondary | ICD-10-CM | POA: Diagnosis not present

## 2019-12-26 DIAGNOSIS — G4733 Obstructive sleep apnea (adult) (pediatric): Secondary | ICD-10-CM | POA: Diagnosis not present

## 2019-12-26 DIAGNOSIS — R001 Bradycardia, unspecified: Secondary | ICD-10-CM | POA: Diagnosis not present

## 2019-12-26 DIAGNOSIS — I10 Essential (primary) hypertension: Secondary | ICD-10-CM | POA: Diagnosis not present

## 2019-12-26 DIAGNOSIS — R0902 Hypoxemia: Secondary | ICD-10-CM | POA: Diagnosis not present

## 2019-12-26 DIAGNOSIS — H00012 Hordeolum externum right lower eyelid: Secondary | ICD-10-CM | POA: Diagnosis not present

## 2019-12-26 DIAGNOSIS — H5789 Other specified disorders of eye and adnexa: Secondary | ICD-10-CM | POA: Diagnosis not present

## 2019-12-26 DIAGNOSIS — J9601 Acute respiratory failure with hypoxia: Secondary | ICD-10-CM | POA: Diagnosis not present

## 2019-12-27 DIAGNOSIS — E7800 Pure hypercholesterolemia, unspecified: Secondary | ICD-10-CM | POA: Diagnosis not present

## 2019-12-27 DIAGNOSIS — E785 Hyperlipidemia, unspecified: Secondary | ICD-10-CM | POA: Diagnosis not present

## 2019-12-27 DIAGNOSIS — C182 Malignant neoplasm of ascending colon: Secondary | ICD-10-CM | POA: Diagnosis not present

## 2019-12-27 DIAGNOSIS — C184 Malignant neoplasm of transverse colon: Secondary | ICD-10-CM | POA: Diagnosis not present

## 2019-12-27 DIAGNOSIS — M1990 Unspecified osteoarthritis, unspecified site: Secondary | ICD-10-CM | POA: Diagnosis not present

## 2019-12-27 DIAGNOSIS — Z01818 Encounter for other preprocedural examination: Secondary | ICD-10-CM | POA: Diagnosis not present

## 2019-12-27 DIAGNOSIS — E1165 Type 2 diabetes mellitus with hyperglycemia: Secondary | ICD-10-CM | POA: Diagnosis not present

## 2019-12-27 DIAGNOSIS — E782 Mixed hyperlipidemia: Secondary | ICD-10-CM | POA: Diagnosis not present

## 2019-12-27 DIAGNOSIS — I959 Hypotension, unspecified: Secondary | ICD-10-CM | POA: Diagnosis not present

## 2019-12-27 DIAGNOSIS — M1712 Unilateral primary osteoarthritis, left knee: Secondary | ICD-10-CM | POA: Diagnosis not present

## 2019-12-27 DIAGNOSIS — I340 Nonrheumatic mitral (valve) insufficiency: Secondary | ICD-10-CM | POA: Diagnosis not present

## 2019-12-27 DIAGNOSIS — I480 Paroxysmal atrial fibrillation: Secondary | ICD-10-CM | POA: Diagnosis not present

## 2019-12-27 DIAGNOSIS — Z794 Long term (current) use of insulin: Secondary | ICD-10-CM | POA: Diagnosis not present

## 2019-12-27 DIAGNOSIS — I2510 Atherosclerotic heart disease of native coronary artery without angina pectoris: Secondary | ICD-10-CM | POA: Diagnosis not present

## 2019-12-27 DIAGNOSIS — Z119 Encounter for screening for infectious and parasitic diseases, unspecified: Secondary | ICD-10-CM | POA: Diagnosis not present

## 2019-12-27 DIAGNOSIS — K660 Peritoneal adhesions (postprocedural) (postinfection): Secondary | ICD-10-CM | POA: Diagnosis not present

## 2019-12-27 DIAGNOSIS — I495 Sick sinus syndrome: Secondary | ICD-10-CM | POA: Diagnosis not present

## 2019-12-27 DIAGNOSIS — E559 Vitamin D deficiency, unspecified: Secondary | ICD-10-CM | POA: Diagnosis not present

## 2019-12-27 DIAGNOSIS — R040 Epistaxis: Secondary | ICD-10-CM | POA: Diagnosis not present

## 2019-12-27 DIAGNOSIS — J4530 Mild persistent asthma, uncomplicated: Secondary | ICD-10-CM | POA: Diagnosis not present

## 2019-12-27 DIAGNOSIS — N401 Enlarged prostate with lower urinary tract symptoms: Secondary | ICD-10-CM | POA: Diagnosis not present

## 2019-12-27 DIAGNOSIS — Z4789 Encounter for other orthopedic aftercare: Secondary | ICD-10-CM | POA: Diagnosis not present

## 2019-12-27 DIAGNOSIS — D649 Anemia, unspecified: Secondary | ICD-10-CM | POA: Diagnosis not present

## 2019-12-27 DIAGNOSIS — I509 Heart failure, unspecified: Secondary | ICD-10-CM | POA: Diagnosis not present

## 2019-12-27 DIAGNOSIS — M1711 Unilateral primary osteoarthritis, right knee: Secondary | ICD-10-CM | POA: Diagnosis not present

## 2019-12-27 DIAGNOSIS — D6959 Other secondary thrombocytopenia: Secondary | ICD-10-CM | POA: Diagnosis not present

## 2019-12-27 DIAGNOSIS — Z5111 Encounter for antineoplastic chemotherapy: Secondary | ICD-10-CM | POA: Diagnosis not present

## 2019-12-27 DIAGNOSIS — H25812 Combined forms of age-related cataract, left eye: Secondary | ICD-10-CM | POA: Diagnosis not present

## 2019-12-27 DIAGNOSIS — H2511 Age-related nuclear cataract, right eye: Secondary | ICD-10-CM | POA: Diagnosis not present

## 2019-12-27 DIAGNOSIS — E669 Obesity, unspecified: Secondary | ICD-10-CM | POA: Diagnosis not present

## 2019-12-27 DIAGNOSIS — H25811 Combined forms of age-related cataract, right eye: Secondary | ICD-10-CM | POA: Diagnosis not present

## 2019-12-27 DIAGNOSIS — I4821 Permanent atrial fibrillation: Secondary | ICD-10-CM | POA: Diagnosis not present

## 2019-12-27 DIAGNOSIS — Z5181 Encounter for therapeutic drug level monitoring: Secondary | ICD-10-CM | POA: Diagnosis not present

## 2019-12-27 DIAGNOSIS — I420 Dilated cardiomyopathy: Secondary | ICD-10-CM | POA: Diagnosis not present

## 2019-12-27 DIAGNOSIS — N179 Acute kidney failure, unspecified: Secondary | ICD-10-CM | POA: Diagnosis not present

## 2019-12-27 DIAGNOSIS — I25810 Atherosclerosis of coronary artery bypass graft(s) without angina pectoris: Secondary | ICD-10-CM | POA: Diagnosis not present

## 2019-12-27 DIAGNOSIS — C50911 Malignant neoplasm of unspecified site of right female breast: Secondary | ICD-10-CM | POA: Diagnosis not present

## 2019-12-27 DIAGNOSIS — F419 Anxiety disorder, unspecified: Secondary | ICD-10-CM | POA: Diagnosis not present

## 2019-12-27 DIAGNOSIS — Z9884 Bariatric surgery status: Secondary | ICD-10-CM | POA: Diagnosis not present

## 2019-12-27 DIAGNOSIS — F329 Major depressive disorder, single episode, unspecified: Secondary | ICD-10-CM | POA: Diagnosis not present

## 2019-12-27 DIAGNOSIS — M9903 Segmental and somatic dysfunction of lumbar region: Secondary | ICD-10-CM | POA: Diagnosis not present

## 2019-12-27 DIAGNOSIS — Z471 Aftercare following joint replacement surgery: Secondary | ICD-10-CM | POA: Diagnosis not present

## 2019-12-27 DIAGNOSIS — R000 Tachycardia, unspecified: Secondary | ICD-10-CM | POA: Diagnosis not present

## 2019-12-27 DIAGNOSIS — E871 Hypo-osmolality and hyponatremia: Secondary | ICD-10-CM | POA: Diagnosis not present

## 2019-12-27 DIAGNOSIS — E1142 Type 2 diabetes mellitus with diabetic polyneuropathy: Secondary | ICD-10-CM | POA: Diagnosis not present

## 2019-12-27 DIAGNOSIS — E113293 Type 2 diabetes mellitus with mild nonproliferative diabetic retinopathy without macular edema, bilateral: Secondary | ICD-10-CM | POA: Diagnosis not present

## 2019-12-27 DIAGNOSIS — C50412 Malignant neoplasm of upper-outer quadrant of left female breast: Secondary | ICD-10-CM | POA: Diagnosis not present

## 2019-12-27 DIAGNOSIS — I2699 Other pulmonary embolism without acute cor pulmonale: Secondary | ICD-10-CM | POA: Diagnosis not present

## 2019-12-27 DIAGNOSIS — T451X5A Adverse effect of antineoplastic and immunosuppressive drugs, initial encounter: Secondary | ICD-10-CM | POA: Diagnosis not present

## 2019-12-27 DIAGNOSIS — R300 Dysuria: Secondary | ICD-10-CM | POA: Diagnosis not present

## 2019-12-27 DIAGNOSIS — I10 Essential (primary) hypertension: Secondary | ICD-10-CM | POA: Diagnosis not present

## 2019-12-27 DIAGNOSIS — Z006 Encounter for examination for normal comparison and control in clinical research program: Secondary | ICD-10-CM | POA: Diagnosis not present

## 2019-12-27 DIAGNOSIS — N200 Calculus of kidney: Secondary | ICD-10-CM | POA: Diagnosis not present

## 2019-12-27 DIAGNOSIS — M9902 Segmental and somatic dysfunction of thoracic region: Secondary | ICD-10-CM | POA: Diagnosis not present

## 2019-12-27 DIAGNOSIS — M546 Pain in thoracic spine: Secondary | ICD-10-CM | POA: Diagnosis not present

## 2019-12-27 DIAGNOSIS — N760 Acute vaginitis: Secondary | ICD-10-CM | POA: Diagnosis not present

## 2019-12-27 DIAGNOSIS — Z7901 Long term (current) use of anticoagulants: Secondary | ICD-10-CM | POA: Diagnosis not present

## 2019-12-27 DIAGNOSIS — M5441 Lumbago with sciatica, right side: Secondary | ICD-10-CM | POA: Diagnosis not present

## 2019-12-27 DIAGNOSIS — Z79899 Other long term (current) drug therapy: Secondary | ICD-10-CM | POA: Diagnosis not present

## 2019-12-27 DIAGNOSIS — J449 Chronic obstructive pulmonary disease, unspecified: Secondary | ICD-10-CM | POA: Diagnosis not present

## 2019-12-27 DIAGNOSIS — Z961 Presence of intraocular lens: Secondary | ICD-10-CM | POA: Diagnosis not present

## 2019-12-27 DIAGNOSIS — F0390 Unspecified dementia without behavioral disturbance: Secondary | ICD-10-CM | POA: Diagnosis not present

## 2019-12-27 DIAGNOSIS — H35033 Hypertensive retinopathy, bilateral: Secondary | ICD-10-CM | POA: Diagnosis not present

## 2019-12-27 DIAGNOSIS — J9601 Acute respiratory failure with hypoxia: Secondary | ICD-10-CM | POA: Diagnosis not present

## 2019-12-27 DIAGNOSIS — K219 Gastro-esophageal reflux disease without esophagitis: Secondary | ICD-10-CM | POA: Diagnosis not present

## 2019-12-27 DIAGNOSIS — R7989 Other specified abnormal findings of blood chemistry: Secondary | ICD-10-CM | POA: Diagnosis not present

## 2019-12-27 DIAGNOSIS — M955 Acquired deformity of pelvis: Secondary | ICD-10-CM | POA: Diagnosis not present

## 2019-12-27 DIAGNOSIS — R972 Elevated prostate specific antigen [PSA]: Secondary | ICD-10-CM | POA: Diagnosis not present

## 2019-12-27 DIAGNOSIS — R21 Rash and other nonspecific skin eruption: Secondary | ICD-10-CM | POA: Diagnosis not present

## 2019-12-27 DIAGNOSIS — E119 Type 2 diabetes mellitus without complications: Secondary | ICD-10-CM | POA: Diagnosis not present

## 2019-12-27 DIAGNOSIS — R079 Chest pain, unspecified: Secondary | ICD-10-CM | POA: Diagnosis not present

## 2019-12-27 DIAGNOSIS — Z6841 Body Mass Index (BMI) 40.0 and over, adult: Secondary | ICD-10-CM | POA: Diagnosis not present

## 2019-12-27 DIAGNOSIS — M9905 Segmental and somatic dysfunction of pelvic region: Secondary | ICD-10-CM | POA: Diagnosis not present

## 2019-12-27 DIAGNOSIS — U071 COVID-19: Secondary | ICD-10-CM | POA: Diagnosis not present

## 2019-12-27 DIAGNOSIS — I5021 Acute systolic (congestive) heart failure: Secondary | ICD-10-CM | POA: Diagnosis not present

## 2019-12-27 DIAGNOSIS — Z950 Presence of cardiac pacemaker: Secondary | ICD-10-CM | POA: Diagnosis not present

## 2019-12-27 DIAGNOSIS — I081 Rheumatic disorders of both mitral and tricuspid valves: Secondary | ICD-10-CM | POA: Diagnosis not present

## 2019-12-27 DIAGNOSIS — E213 Hyperparathyroidism, unspecified: Secondary | ICD-10-CM | POA: Diagnosis not present

## 2019-12-27 DIAGNOSIS — G4733 Obstructive sleep apnea (adult) (pediatric): Secondary | ICD-10-CM | POA: Diagnosis not present

## 2019-12-27 DIAGNOSIS — S41111D Laceration without foreign body of right upper arm, subsequent encounter: Secondary | ICD-10-CM | POA: Diagnosis not present

## 2019-12-27 DIAGNOSIS — I4891 Unspecified atrial fibrillation: Secondary | ICD-10-CM | POA: Diagnosis not present

## 2019-12-28 DIAGNOSIS — Z951 Presence of aortocoronary bypass graft: Secondary | ICD-10-CM | POA: Diagnosis not present

## 2019-12-28 DIAGNOSIS — I6381 Other cerebral infarction due to occlusion or stenosis of small artery: Secondary | ICD-10-CM | POA: Diagnosis not present

## 2019-12-28 DIAGNOSIS — N1330 Unspecified hydronephrosis: Secondary | ICD-10-CM | POA: Diagnosis not present

## 2019-12-28 DIAGNOSIS — Z9889 Other specified postprocedural states: Secondary | ICD-10-CM | POA: Diagnosis not present

## 2019-12-28 DIAGNOSIS — Z6841 Body Mass Index (BMI) 40.0 and over, adult: Secondary | ICD-10-CM | POA: Diagnosis not present

## 2019-12-28 DIAGNOSIS — E782 Mixed hyperlipidemia: Secondary | ICD-10-CM | POA: Diagnosis not present

## 2019-12-28 DIAGNOSIS — M4802 Spinal stenosis, cervical region: Secondary | ICD-10-CM | POA: Diagnosis not present

## 2019-12-28 DIAGNOSIS — Z4824 Encounter for aftercare following lung transplant: Secondary | ICD-10-CM | POA: Diagnosis not present

## 2019-12-28 DIAGNOSIS — G8929 Other chronic pain: Secondary | ICD-10-CM | POA: Diagnosis not present

## 2019-12-28 DIAGNOSIS — I4510 Unspecified right bundle-branch block: Secondary | ICD-10-CM | POA: Diagnosis not present

## 2019-12-28 DIAGNOSIS — Z9861 Coronary angioplasty status: Secondary | ICD-10-CM | POA: Diagnosis not present

## 2019-12-28 DIAGNOSIS — I10 Essential (primary) hypertension: Secondary | ICD-10-CM | POA: Diagnosis not present

## 2019-12-28 DIAGNOSIS — M810 Age-related osteoporosis without current pathological fracture: Secondary | ICD-10-CM | POA: Diagnosis not present

## 2019-12-28 DIAGNOSIS — Z1159 Encounter for screening for other viral diseases: Secondary | ICD-10-CM | POA: Diagnosis not present

## 2019-12-28 DIAGNOSIS — J439 Emphysema, unspecified: Secondary | ICD-10-CM | POA: Diagnosis not present

## 2019-12-28 DIAGNOSIS — R0981 Nasal congestion: Secondary | ICD-10-CM | POA: Diagnosis not present

## 2019-12-28 DIAGNOSIS — K219 Gastro-esophageal reflux disease without esophagitis: Secondary | ICD-10-CM | POA: Diagnosis not present

## 2019-12-28 DIAGNOSIS — H9190 Unspecified hearing loss, unspecified ear: Secondary | ICD-10-CM | POA: Diagnosis not present

## 2019-12-28 DIAGNOSIS — R482 Apraxia: Secondary | ICD-10-CM | POA: Diagnosis not present

## 2019-12-28 DIAGNOSIS — Z8616 Personal history of COVID-19: Secondary | ICD-10-CM | POA: Diagnosis not present

## 2019-12-28 DIAGNOSIS — E785 Hyperlipidemia, unspecified: Secondary | ICD-10-CM | POA: Diagnosis not present

## 2019-12-28 DIAGNOSIS — E291 Testicular hypofunction: Secondary | ICD-10-CM | POA: Diagnosis not present

## 2019-12-28 DIAGNOSIS — R402142 Coma scale, eyes open, spontaneous, at arrival to emergency department: Secondary | ICD-10-CM | POA: Diagnosis not present

## 2019-12-28 DIAGNOSIS — I519 Heart disease, unspecified: Secondary | ICD-10-CM | POA: Diagnosis not present

## 2019-12-28 DIAGNOSIS — M79642 Pain in left hand: Secondary | ICD-10-CM | POA: Diagnosis not present

## 2019-12-28 DIAGNOSIS — D0439 Carcinoma in situ of skin of other parts of face: Secondary | ICD-10-CM | POA: Diagnosis not present

## 2019-12-28 DIAGNOSIS — Z7189 Other specified counseling: Secondary | ICD-10-CM | POA: Diagnosis not present

## 2019-12-28 DIAGNOSIS — M47817 Spondylosis without myelopathy or radiculopathy, lumbosacral region: Secondary | ICD-10-CM | POA: Diagnosis not present

## 2019-12-28 DIAGNOSIS — Z20828 Contact with and (suspected) exposure to other viral communicable diseases: Secondary | ICD-10-CM | POA: Diagnosis not present

## 2019-12-28 DIAGNOSIS — B354 Tinea corporis: Secondary | ICD-10-CM | POA: Diagnosis not present

## 2019-12-28 DIAGNOSIS — F419 Anxiety disorder, unspecified: Secondary | ICD-10-CM | POA: Diagnosis not present

## 2019-12-28 DIAGNOSIS — Z955 Presence of coronary angioplasty implant and graft: Secondary | ICD-10-CM | POA: Diagnosis not present

## 2019-12-28 DIAGNOSIS — Z79899 Other long term (current) drug therapy: Secondary | ICD-10-CM | POA: Diagnosis not present

## 2019-12-28 DIAGNOSIS — M13132 Monoarthritis, not elsewhere classified, left wrist: Secondary | ICD-10-CM | POA: Diagnosis not present

## 2019-12-28 DIAGNOSIS — N401 Enlarged prostate with lower urinary tract symptoms: Secondary | ICD-10-CM | POA: Diagnosis not present

## 2019-12-28 DIAGNOSIS — G4734 Idiopathic sleep related nonobstructive alveolar hypoventilation: Secondary | ICD-10-CM | POA: Diagnosis not present

## 2019-12-28 DIAGNOSIS — M5442 Lumbago with sciatica, left side: Secondary | ICD-10-CM | POA: Diagnosis not present

## 2019-12-28 DIAGNOSIS — I700 Atherosclerosis of aorta: Secondary | ICD-10-CM | POA: Diagnosis not present

## 2019-12-28 DIAGNOSIS — F329 Major depressive disorder, single episode, unspecified: Secondary | ICD-10-CM | POA: Diagnosis not present

## 2019-12-28 DIAGNOSIS — I4891 Unspecified atrial fibrillation: Secondary | ICD-10-CM | POA: Diagnosis not present

## 2019-12-28 DIAGNOSIS — N184 Chronic kidney disease, stage 4 (severe): Secondary | ICD-10-CM | POA: Diagnosis not present

## 2019-12-28 DIAGNOSIS — J984 Other disorders of lung: Secondary | ICD-10-CM | POA: Diagnosis not present

## 2019-12-28 DIAGNOSIS — M5136 Other intervertebral disc degeneration, lumbar region: Secondary | ICD-10-CM | POA: Diagnosis not present

## 2019-12-28 DIAGNOSIS — J9611 Chronic respiratory failure with hypoxia: Secondary | ICD-10-CM | POA: Diagnosis not present

## 2019-12-28 DIAGNOSIS — M1990 Unspecified osteoarthritis, unspecified site: Secondary | ICD-10-CM | POA: Diagnosis not present

## 2019-12-28 DIAGNOSIS — L03116 Cellulitis of left lower limb: Secondary | ICD-10-CM | POA: Diagnosis not present

## 2019-12-28 DIAGNOSIS — Z20822 Contact with and (suspected) exposure to covid-19: Secondary | ICD-10-CM | POA: Diagnosis not present

## 2019-12-28 DIAGNOSIS — D801 Nonfamilial hypogammaglobulinemia: Secondary | ICD-10-CM | POA: Diagnosis not present

## 2019-12-28 DIAGNOSIS — I209 Angina pectoris, unspecified: Secondary | ICD-10-CM | POA: Diagnosis not present

## 2019-12-28 DIAGNOSIS — I739 Peripheral vascular disease, unspecified: Secondary | ICD-10-CM | POA: Diagnosis not present

## 2019-12-28 DIAGNOSIS — J449 Chronic obstructive pulmonary disease, unspecified: Secondary | ICD-10-CM | POA: Diagnosis not present

## 2019-12-28 DIAGNOSIS — I110 Hypertensive heart disease with heart failure: Secondary | ICD-10-CM | POA: Diagnosis not present

## 2019-12-28 DIAGNOSIS — E7800 Pure hypercholesterolemia, unspecified: Secondary | ICD-10-CM | POA: Diagnosis not present

## 2019-12-28 DIAGNOSIS — E1029 Type 1 diabetes mellitus with other diabetic kidney complication: Secondary | ICD-10-CM | POA: Diagnosis not present

## 2019-12-28 DIAGNOSIS — I5021 Acute systolic (congestive) heart failure: Secondary | ICD-10-CM | POA: Diagnosis not present

## 2019-12-28 DIAGNOSIS — G20 Parkinson's disease: Secondary | ICD-10-CM | POA: Diagnosis not present

## 2019-12-28 DIAGNOSIS — Z01411 Encounter for gynecological examination (general) (routine) with abnormal findings: Secondary | ICD-10-CM | POA: Diagnosis not present

## 2019-12-28 DIAGNOSIS — N189 Chronic kidney disease, unspecified: Secondary | ICD-10-CM | POA: Diagnosis not present

## 2019-12-28 DIAGNOSIS — I872 Venous insufficiency (chronic) (peripheral): Secondary | ICD-10-CM | POA: Diagnosis not present

## 2019-12-28 DIAGNOSIS — R29703 NIHSS score 3: Secondary | ICD-10-CM | POA: Diagnosis not present

## 2019-12-28 DIAGNOSIS — I69322 Dysarthria following cerebral infarction: Secondary | ICD-10-CM | POA: Diagnosis not present

## 2019-12-28 DIAGNOSIS — Z789 Other specified health status: Secondary | ICD-10-CM | POA: Diagnosis not present

## 2019-12-28 DIAGNOSIS — Z8543 Personal history of malignant neoplasm of ovary: Secondary | ICD-10-CM | POA: Diagnosis not present

## 2019-12-28 DIAGNOSIS — I272 Pulmonary hypertension, unspecified: Secondary | ICD-10-CM | POA: Diagnosis not present

## 2019-12-28 DIAGNOSIS — E119 Type 2 diabetes mellitus without complications: Secondary | ICD-10-CM | POA: Diagnosis not present

## 2019-12-28 DIAGNOSIS — M50022 Cervical disc disorder at C5-C6 level with myelopathy: Secondary | ICD-10-CM | POA: Diagnosis not present

## 2019-12-28 DIAGNOSIS — F17210 Nicotine dependence, cigarettes, uncomplicated: Secondary | ICD-10-CM | POA: Diagnosis not present

## 2019-12-28 DIAGNOSIS — G8191 Hemiplegia, unspecified affecting right dominant side: Secondary | ICD-10-CM | POA: Diagnosis not present

## 2019-12-28 DIAGNOSIS — I447 Left bundle-branch block, unspecified: Secondary | ICD-10-CM | POA: Diagnosis not present

## 2019-12-28 DIAGNOSIS — I482 Chronic atrial fibrillation, unspecified: Secondary | ICD-10-CM | POA: Diagnosis not present

## 2019-12-28 DIAGNOSIS — M955 Acquired deformity of pelvis: Secondary | ICD-10-CM | POA: Diagnosis not present

## 2019-12-28 DIAGNOSIS — C61 Malignant neoplasm of prostate: Secondary | ICD-10-CM | POA: Diagnosis not present

## 2019-12-28 DIAGNOSIS — F0390 Unspecified dementia without behavioral disturbance: Secondary | ICD-10-CM | POA: Diagnosis not present

## 2019-12-28 DIAGNOSIS — Z7902 Long term (current) use of antithrombotics/antiplatelets: Secondary | ICD-10-CM | POA: Diagnosis not present

## 2019-12-28 DIAGNOSIS — I5042 Chronic combined systolic (congestive) and diastolic (congestive) heart failure: Secondary | ICD-10-CM | POA: Diagnosis not present

## 2019-12-28 DIAGNOSIS — R402252 Coma scale, best verbal response, oriented, at arrival to emergency department: Secondary | ICD-10-CM | POA: Diagnosis not present

## 2019-12-28 DIAGNOSIS — R7989 Other specified abnormal findings of blood chemistry: Secondary | ICD-10-CM | POA: Diagnosis not present

## 2019-12-28 DIAGNOSIS — Z6829 Body mass index (BMI) 29.0-29.9, adult: Secondary | ICD-10-CM | POA: Diagnosis not present

## 2019-12-28 DIAGNOSIS — R918 Other nonspecific abnormal finding of lung field: Secondary | ICD-10-CM | POA: Diagnosis not present

## 2019-12-28 DIAGNOSIS — I129 Hypertensive chronic kidney disease with stage 1 through stage 4 chronic kidney disease, or unspecified chronic kidney disease: Secondary | ICD-10-CM | POA: Diagnosis not present

## 2019-12-28 DIAGNOSIS — N8110 Cystocele, unspecified: Secondary | ICD-10-CM | POA: Diagnosis not present

## 2019-12-28 DIAGNOSIS — M9902 Segmental and somatic dysfunction of thoracic region: Secondary | ICD-10-CM | POA: Diagnosis not present

## 2019-12-28 DIAGNOSIS — M7071 Other bursitis of hip, right hip: Secondary | ICD-10-CM | POA: Diagnosis not present

## 2019-12-28 DIAGNOSIS — D692 Other nonthrombocytopenic purpura: Secondary | ICD-10-CM | POA: Diagnosis not present

## 2019-12-28 DIAGNOSIS — E1140 Type 2 diabetes mellitus with diabetic neuropathy, unspecified: Secondary | ICD-10-CM | POA: Diagnosis not present

## 2019-12-28 DIAGNOSIS — N1831 Chronic kidney disease, stage 3a: Secondary | ICD-10-CM | POA: Diagnosis not present

## 2019-12-28 DIAGNOSIS — Z6822 Body mass index (BMI) 22.0-22.9, adult: Secondary | ICD-10-CM | POA: Diagnosis not present

## 2019-12-28 DIAGNOSIS — M545 Low back pain: Secondary | ICD-10-CM | POA: Diagnosis not present

## 2019-12-28 DIAGNOSIS — M5417 Radiculopathy, lumbosacral region: Secondary | ICD-10-CM | POA: Diagnosis not present

## 2019-12-28 DIAGNOSIS — S065X9A Traumatic subdural hemorrhage with loss of consciousness of unspecified duration, initial encounter: Secondary | ICD-10-CM | POA: Diagnosis not present

## 2019-12-28 DIAGNOSIS — R600 Localized edema: Secondary | ICD-10-CM | POA: Diagnosis not present

## 2019-12-28 DIAGNOSIS — N179 Acute kidney failure, unspecified: Secondary | ICD-10-CM | POA: Diagnosis not present

## 2019-12-28 DIAGNOSIS — R7302 Impaired glucose tolerance (oral): Secondary | ICD-10-CM | POA: Diagnosis not present

## 2019-12-28 DIAGNOSIS — Z950 Presence of cardiac pacemaker: Secondary | ICD-10-CM | POA: Diagnosis not present

## 2019-12-28 DIAGNOSIS — Z79890 Hormone replacement therapy (postmenopausal): Secondary | ICD-10-CM | POA: Diagnosis not present

## 2019-12-28 DIAGNOSIS — I420 Dilated cardiomyopathy: Secondary | ICD-10-CM | POA: Diagnosis not present

## 2019-12-28 DIAGNOSIS — M6281 Muscle weakness (generalized): Secondary | ICD-10-CM | POA: Diagnosis not present

## 2019-12-28 DIAGNOSIS — I130 Hypertensive heart and chronic kidney disease with heart failure and stage 1 through stage 4 chronic kidney disease, or unspecified chronic kidney disease: Secondary | ICD-10-CM | POA: Diagnosis not present

## 2019-12-28 DIAGNOSIS — I160 Hypertensive urgency: Secondary | ICD-10-CM | POA: Diagnosis not present

## 2019-12-28 DIAGNOSIS — M531 Cervicobrachial syndrome: Secondary | ICD-10-CM | POA: Diagnosis not present

## 2019-12-28 DIAGNOSIS — R270 Ataxia, unspecified: Secondary | ICD-10-CM | POA: Diagnosis not present

## 2019-12-28 DIAGNOSIS — B0229 Other postherpetic nervous system involvement: Secondary | ICD-10-CM | POA: Diagnosis not present

## 2019-12-28 DIAGNOSIS — J929 Pleural plaque without asbestos: Secondary | ICD-10-CM | POA: Diagnosis not present

## 2019-12-28 DIAGNOSIS — E1122 Type 2 diabetes mellitus with diabetic chronic kidney disease: Secondary | ICD-10-CM | POA: Diagnosis not present

## 2019-12-28 DIAGNOSIS — M79673 Pain in unspecified foot: Secondary | ICD-10-CM | POA: Diagnosis not present

## 2019-12-28 DIAGNOSIS — I4821 Permanent atrial fibrillation: Secondary | ICD-10-CM | POA: Diagnosis not present

## 2019-12-28 DIAGNOSIS — Z5181 Encounter for therapeutic drug level monitoring: Secondary | ICD-10-CM | POA: Diagnosis not present

## 2019-12-28 DIAGNOSIS — I2699 Other pulmonary embolism without acute cor pulmonale: Secondary | ICD-10-CM | POA: Diagnosis not present

## 2019-12-28 DIAGNOSIS — R7301 Impaired fasting glucose: Secondary | ICD-10-CM | POA: Diagnosis not present

## 2019-12-28 DIAGNOSIS — M9903 Segmental and somatic dysfunction of lumbar region: Secondary | ICD-10-CM | POA: Diagnosis not present

## 2019-12-28 DIAGNOSIS — M542 Cervicalgia: Secondary | ICD-10-CM | POA: Diagnosis not present

## 2019-12-28 DIAGNOSIS — M9901 Segmental and somatic dysfunction of cervical region: Secondary | ICD-10-CM | POA: Diagnosis not present

## 2019-12-28 DIAGNOSIS — N183 Chronic kidney disease, stage 3 unspecified: Secondary | ICD-10-CM | POA: Diagnosis not present

## 2019-12-28 DIAGNOSIS — F5101 Primary insomnia: Secondary | ICD-10-CM | POA: Diagnosis not present

## 2019-12-28 DIAGNOSIS — R402362 Coma scale, best motor response, obeys commands, at arrival to emergency department: Secondary | ICD-10-CM | POA: Diagnosis not present

## 2019-12-28 DIAGNOSIS — I69351 Hemiplegia and hemiparesis following cerebral infarction affecting right dominant side: Secondary | ICD-10-CM | POA: Diagnosis not present

## 2019-12-28 DIAGNOSIS — Z7982 Long term (current) use of aspirin: Secondary | ICD-10-CM | POA: Diagnosis not present

## 2019-12-28 DIAGNOSIS — M5137 Other intervertebral disc degeneration, lumbosacral region: Secondary | ICD-10-CM | POA: Diagnosis not present

## 2019-12-28 DIAGNOSIS — J40 Bronchitis, not specified as acute or chronic: Secondary | ICD-10-CM | POA: Diagnosis not present

## 2019-12-28 DIAGNOSIS — N611 Abscess of the breast and nipple: Secondary | ICD-10-CM | POA: Diagnosis not present

## 2019-12-28 DIAGNOSIS — Z882 Allergy status to sulfonamides status: Secondary | ICD-10-CM | POA: Diagnosis not present

## 2019-12-28 DIAGNOSIS — M9905 Segmental and somatic dysfunction of pelvic region: Secondary | ICD-10-CM | POA: Diagnosis not present

## 2019-12-28 DIAGNOSIS — E039 Hypothyroidism, unspecified: Secondary | ICD-10-CM | POA: Diagnosis not present

## 2019-12-28 DIAGNOSIS — I255 Ischemic cardiomyopathy: Secondary | ICD-10-CM | POA: Diagnosis not present

## 2019-12-28 DIAGNOSIS — R197 Diarrhea, unspecified: Secondary | ICD-10-CM | POA: Diagnosis not present

## 2019-12-28 DIAGNOSIS — Z8551 Personal history of malignant neoplasm of bladder: Secondary | ICD-10-CM | POA: Diagnosis not present

## 2019-12-28 DIAGNOSIS — E781 Pure hyperglyceridemia: Secondary | ICD-10-CM | POA: Diagnosis not present

## 2019-12-28 DIAGNOSIS — I2510 Atherosclerotic heart disease of native coronary artery without angina pectoris: Secondary | ICD-10-CM | POA: Diagnosis not present

## 2019-12-28 DIAGNOSIS — E871 Hypo-osmolality and hyponatremia: Secondary | ICD-10-CM | POA: Diagnosis not present

## 2019-12-28 DIAGNOSIS — M50121 Cervical disc disorder at C4-C5 level with radiculopathy: Secondary | ICD-10-CM | POA: Diagnosis not present

## 2019-12-28 DIAGNOSIS — Z0000 Encounter for general adult medical examination without abnormal findings: Secondary | ICD-10-CM | POA: Diagnosis not present

## 2019-12-28 DIAGNOSIS — I495 Sick sinus syndrome: Secondary | ICD-10-CM | POA: Diagnosis not present

## 2019-12-28 DIAGNOSIS — Z8249 Family history of ischemic heart disease and other diseases of the circulatory system: Secondary | ICD-10-CM | POA: Diagnosis not present

## 2019-12-28 DIAGNOSIS — Z7901 Long term (current) use of anticoagulants: Secondary | ICD-10-CM | POA: Diagnosis not present

## 2019-12-28 DIAGNOSIS — K5900 Constipation, unspecified: Secondary | ICD-10-CM | POA: Diagnosis not present

## 2019-12-28 DIAGNOSIS — I480 Paroxysmal atrial fibrillation: Secondary | ICD-10-CM | POA: Diagnosis not present

## 2019-12-28 DIAGNOSIS — C44319 Basal cell carcinoma of skin of other parts of face: Secondary | ICD-10-CM | POA: Diagnosis not present

## 2019-12-28 DIAGNOSIS — I509 Heart failure, unspecified: Secondary | ICD-10-CM | POA: Diagnosis not present

## 2019-12-28 DIAGNOSIS — E1040 Type 1 diabetes mellitus with diabetic neuropathy, unspecified: Secondary | ICD-10-CM | POA: Diagnosis not present

## 2019-12-28 DIAGNOSIS — Z952 Presence of prosthetic heart valve: Secondary | ICD-10-CM | POA: Diagnosis not present

## 2019-12-28 DIAGNOSIS — T501X5A Adverse effect of loop [high-ceiling] diuretics, initial encounter: Secondary | ICD-10-CM | POA: Diagnosis not present

## 2019-12-28 DIAGNOSIS — U071 COVID-19: Secondary | ICD-10-CM | POA: Diagnosis not present

## 2019-12-28 DIAGNOSIS — R471 Dysarthria and anarthria: Secondary | ICD-10-CM | POA: Diagnosis not present

## 2019-12-28 DIAGNOSIS — Z8673 Personal history of transient ischemic attack (TIA), and cerebral infarction without residual deficits: Secondary | ICD-10-CM | POA: Diagnosis not present

## 2019-12-28 DIAGNOSIS — M9904 Segmental and somatic dysfunction of sacral region: Secondary | ICD-10-CM | POA: Diagnosis not present

## 2019-12-28 DIAGNOSIS — Z9981 Dependence on supplemental oxygen: Secondary | ICD-10-CM | POA: Diagnosis not present

## 2019-12-28 DIAGNOSIS — Z791 Long term (current) use of non-steroidal anti-inflammatories (NSAID): Secondary | ICD-10-CM | POA: Diagnosis not present

## 2019-12-28 DIAGNOSIS — M4316 Spondylolisthesis, lumbar region: Secondary | ICD-10-CM | POA: Diagnosis not present

## 2019-12-28 DIAGNOSIS — N3941 Urge incontinence: Secondary | ICD-10-CM | POA: Diagnosis not present

## 2019-12-28 DIAGNOSIS — Z7952 Long term (current) use of systemic steroids: Secondary | ICD-10-CM | POA: Diagnosis not present

## 2019-12-28 DIAGNOSIS — I340 Nonrheumatic mitral (valve) insufficiency: Secondary | ICD-10-CM | POA: Diagnosis not present

## 2019-12-28 DIAGNOSIS — Z87891 Personal history of nicotine dependence: Secondary | ICD-10-CM | POA: Diagnosis not present

## 2019-12-28 DIAGNOSIS — R030 Elevated blood-pressure reading, without diagnosis of hypertension: Secondary | ICD-10-CM | POA: Diagnosis not present

## 2019-12-28 DIAGNOSIS — M546 Pain in thoracic spine: Secondary | ICD-10-CM | POA: Diagnosis not present

## 2019-12-28 DIAGNOSIS — J9601 Acute respiratory failure with hypoxia: Secondary | ICD-10-CM | POA: Diagnosis not present

## 2019-12-28 DIAGNOSIS — M5441 Lumbago with sciatica, right side: Secondary | ICD-10-CM | POA: Diagnosis not present

## 2019-12-28 DIAGNOSIS — G4700 Insomnia, unspecified: Secondary | ICD-10-CM | POA: Diagnosis not present

## 2019-12-28 DIAGNOSIS — Z803 Family history of malignant neoplasm of breast: Secondary | ICD-10-CM | POA: Diagnosis not present

## 2019-12-29 DIAGNOSIS — J9601 Acute respiratory failure with hypoxia: Secondary | ICD-10-CM | POA: Diagnosis not present

## 2019-12-29 DIAGNOSIS — R002 Palpitations: Secondary | ICD-10-CM | POA: Diagnosis not present

## 2019-12-29 DIAGNOSIS — C9110 Chronic lymphocytic leukemia of B-cell type not having achieved remission: Secondary | ICD-10-CM | POA: Diagnosis not present

## 2019-12-29 DIAGNOSIS — E871 Hypo-osmolality and hyponatremia: Secondary | ICD-10-CM | POA: Diagnosis not present

## 2019-12-29 DIAGNOSIS — Z791 Long term (current) use of non-steroidal anti-inflammatories (NSAID): Secondary | ICD-10-CM | POA: Diagnosis not present

## 2019-12-29 DIAGNOSIS — E1142 Type 2 diabetes mellitus with diabetic polyneuropathy: Secondary | ICD-10-CM | POA: Diagnosis not present

## 2019-12-29 DIAGNOSIS — R918 Other nonspecific abnormal finding of lung field: Secondary | ICD-10-CM | POA: Diagnosis not present

## 2019-12-29 DIAGNOSIS — I714 Abdominal aortic aneurysm, without rupture: Secondary | ICD-10-CM | POA: Diagnosis not present

## 2019-12-29 DIAGNOSIS — E785 Hyperlipidemia, unspecified: Secondary | ICD-10-CM | POA: Diagnosis not present

## 2019-12-29 DIAGNOSIS — E782 Mixed hyperlipidemia: Secondary | ICD-10-CM | POA: Diagnosis not present

## 2019-12-29 DIAGNOSIS — N183 Chronic kidney disease, stage 3 unspecified: Secondary | ICD-10-CM | POA: Diagnosis not present

## 2019-12-29 DIAGNOSIS — M13862 Other specified arthritis, left knee: Secondary | ICD-10-CM | POA: Diagnosis not present

## 2019-12-29 DIAGNOSIS — R5383 Other fatigue: Secondary | ICD-10-CM | POA: Diagnosis not present

## 2019-12-29 DIAGNOSIS — I25119 Atherosclerotic heart disease of native coronary artery with unspecified angina pectoris: Secondary | ICD-10-CM | POA: Diagnosis not present

## 2019-12-29 DIAGNOSIS — K7011 Alcoholic hepatitis with ascites: Secondary | ICD-10-CM | POA: Diagnosis not present

## 2019-12-29 DIAGNOSIS — M810 Age-related osteoporosis without current pathological fracture: Secondary | ICD-10-CM | POA: Diagnosis not present

## 2019-12-29 DIAGNOSIS — I2510 Atherosclerotic heart disease of native coronary artery without angina pectoris: Secondary | ICD-10-CM | POA: Diagnosis not present

## 2019-12-29 DIAGNOSIS — Z5111 Encounter for antineoplastic chemotherapy: Secondary | ICD-10-CM | POA: Diagnosis not present

## 2019-12-29 DIAGNOSIS — I480 Paroxysmal atrial fibrillation: Secondary | ICD-10-CM | POA: Diagnosis not present

## 2019-12-29 DIAGNOSIS — R0902 Hypoxemia: Secondary | ICD-10-CM | POA: Diagnosis not present

## 2019-12-29 DIAGNOSIS — E875 Hyperkalemia: Secondary | ICD-10-CM | POA: Diagnosis not present

## 2019-12-29 DIAGNOSIS — E119 Type 2 diabetes mellitus without complications: Secondary | ICD-10-CM | POA: Diagnosis not present

## 2019-12-29 DIAGNOSIS — M1612 Unilateral primary osteoarthritis, left hip: Secondary | ICD-10-CM | POA: Diagnosis not present

## 2019-12-29 DIAGNOSIS — Z01818 Encounter for other preprocedural examination: Secondary | ICD-10-CM | POA: Diagnosis not present

## 2019-12-29 DIAGNOSIS — I5032 Chronic diastolic (congestive) heart failure: Secondary | ICD-10-CM | POA: Diagnosis not present

## 2019-12-29 DIAGNOSIS — R634 Abnormal weight loss: Secondary | ICD-10-CM | POA: Diagnosis not present

## 2019-12-29 DIAGNOSIS — F17218 Nicotine dependence, cigarettes, with other nicotine-induced disorders: Secondary | ICD-10-CM | POA: Diagnosis not present

## 2019-12-29 DIAGNOSIS — D649 Anemia, unspecified: Secondary | ICD-10-CM | POA: Diagnosis not present

## 2019-12-29 DIAGNOSIS — M533 Sacrococcygeal disorders, not elsewhere classified: Secondary | ICD-10-CM | POA: Diagnosis not present

## 2019-12-29 DIAGNOSIS — Z96641 Presence of right artificial hip joint: Secondary | ICD-10-CM | POA: Diagnosis not present

## 2019-12-29 DIAGNOSIS — Z8673 Personal history of transient ischemic attack (TIA), and cerebral infarction without residual deficits: Secondary | ICD-10-CM | POA: Diagnosis not present

## 2019-12-29 DIAGNOSIS — R188 Other ascites: Secondary | ICD-10-CM | POA: Diagnosis not present

## 2019-12-29 DIAGNOSIS — J849 Interstitial pulmonary disease, unspecified: Secondary | ICD-10-CM | POA: Diagnosis not present

## 2019-12-29 DIAGNOSIS — I5021 Acute systolic (congestive) heart failure: Secondary | ICD-10-CM | POA: Diagnosis not present

## 2019-12-29 DIAGNOSIS — Z23 Encounter for immunization: Secondary | ICD-10-CM | POA: Diagnosis not present

## 2019-12-29 DIAGNOSIS — M542 Cervicalgia: Secondary | ICD-10-CM | POA: Diagnosis not present

## 2019-12-29 DIAGNOSIS — M25571 Pain in right ankle and joints of right foot: Secondary | ICD-10-CM | POA: Diagnosis not present

## 2019-12-29 DIAGNOSIS — N189 Chronic kidney disease, unspecified: Secondary | ICD-10-CM | POA: Diagnosis not present

## 2019-12-29 DIAGNOSIS — G4733 Obstructive sleep apnea (adult) (pediatric): Secondary | ICD-10-CM | POA: Diagnosis not present

## 2019-12-29 DIAGNOSIS — R29810 Facial weakness: Secondary | ICD-10-CM | POA: Diagnosis not present

## 2019-12-29 DIAGNOSIS — Z0001 Encounter for general adult medical examination with abnormal findings: Secondary | ICD-10-CM | POA: Diagnosis not present

## 2019-12-29 DIAGNOSIS — M48061 Spinal stenosis, lumbar region without neurogenic claudication: Secondary | ICD-10-CM | POA: Diagnosis not present

## 2019-12-29 DIAGNOSIS — C7972 Secondary malignant neoplasm of left adrenal gland: Secondary | ICD-10-CM | POA: Diagnosis not present

## 2019-12-29 DIAGNOSIS — I340 Nonrheumatic mitral (valve) insufficiency: Secondary | ICD-10-CM | POA: Diagnosis not present

## 2019-12-29 DIAGNOSIS — Z87891 Personal history of nicotine dependence: Secondary | ICD-10-CM | POA: Diagnosis not present

## 2019-12-29 DIAGNOSIS — I739 Peripheral vascular disease, unspecified: Secondary | ICD-10-CM | POA: Diagnosis not present

## 2019-12-29 DIAGNOSIS — E559 Vitamin D deficiency, unspecified: Secondary | ICD-10-CM | POA: Diagnosis not present

## 2019-12-29 DIAGNOSIS — I420 Dilated cardiomyopathy: Secondary | ICD-10-CM | POA: Diagnosis not present

## 2019-12-29 DIAGNOSIS — R5381 Other malaise: Secondary | ICD-10-CM | POA: Diagnosis not present

## 2019-12-29 DIAGNOSIS — M170 Bilateral primary osteoarthritis of knee: Secondary | ICD-10-CM | POA: Diagnosis not present

## 2019-12-29 DIAGNOSIS — Z7401 Bed confinement status: Secondary | ICD-10-CM | POA: Diagnosis not present

## 2019-12-29 DIAGNOSIS — F319 Bipolar disorder, unspecified: Secondary | ICD-10-CM | POA: Diagnosis not present

## 2019-12-29 DIAGNOSIS — U071 COVID-19: Secondary | ICD-10-CM | POA: Diagnosis not present

## 2019-12-29 DIAGNOSIS — C61 Malignant neoplasm of prostate: Secondary | ICD-10-CM | POA: Diagnosis not present

## 2019-12-29 DIAGNOSIS — E1165 Type 2 diabetes mellitus with hyperglycemia: Secondary | ICD-10-CM | POA: Diagnosis not present

## 2019-12-29 DIAGNOSIS — R8290 Unspecified abnormal findings in urine: Secondary | ICD-10-CM | POA: Diagnosis not present

## 2019-12-29 DIAGNOSIS — R4020 Unspecified coma: Secondary | ICD-10-CM | POA: Diagnosis not present

## 2019-12-29 DIAGNOSIS — K529 Noninfective gastroenteritis and colitis, unspecified: Secondary | ICD-10-CM | POA: Diagnosis not present

## 2019-12-29 DIAGNOSIS — I129 Hypertensive chronic kidney disease with stage 1 through stage 4 chronic kidney disease, or unspecified chronic kidney disease: Secondary | ICD-10-CM | POA: Diagnosis not present

## 2019-12-29 DIAGNOSIS — R7989 Other specified abnormal findings of blood chemistry: Secondary | ICD-10-CM | POA: Diagnosis not present

## 2019-12-29 DIAGNOSIS — I341 Nonrheumatic mitral (valve) prolapse: Secondary | ICD-10-CM | POA: Diagnosis not present

## 2019-12-29 DIAGNOSIS — K0889 Other specified disorders of teeth and supporting structures: Secondary | ICD-10-CM | POA: Diagnosis not present

## 2019-12-29 DIAGNOSIS — R531 Weakness: Secondary | ICD-10-CM | POA: Diagnosis not present

## 2019-12-29 DIAGNOSIS — R0600 Dyspnea, unspecified: Secondary | ICD-10-CM | POA: Diagnosis not present

## 2019-12-29 DIAGNOSIS — R591 Generalized enlarged lymph nodes: Secondary | ICD-10-CM | POA: Diagnosis not present

## 2019-12-29 DIAGNOSIS — S93491A Sprain of other ligament of right ankle, initial encounter: Secondary | ICD-10-CM | POA: Diagnosis not present

## 2019-12-29 DIAGNOSIS — I10 Essential (primary) hypertension: Secondary | ICD-10-CM | POA: Diagnosis not present

## 2019-12-29 DIAGNOSIS — I209 Angina pectoris, unspecified: Secondary | ICD-10-CM | POA: Diagnosis not present

## 2019-12-29 DIAGNOSIS — E860 Dehydration: Secondary | ICD-10-CM | POA: Diagnosis not present

## 2019-12-29 DIAGNOSIS — Z9849 Cataract extraction status, unspecified eye: Secondary | ICD-10-CM | POA: Diagnosis not present

## 2019-12-29 DIAGNOSIS — I4891 Unspecified atrial fibrillation: Secondary | ICD-10-CM | POA: Diagnosis not present

## 2019-12-29 DIAGNOSIS — E7800 Pure hypercholesterolemia, unspecified: Secondary | ICD-10-CM | POA: Diagnosis not present

## 2019-12-29 DIAGNOSIS — Z5181 Encounter for therapeutic drug level monitoring: Secondary | ICD-10-CM | POA: Diagnosis not present

## 2019-12-29 DIAGNOSIS — Z9181 History of falling: Secondary | ICD-10-CM | POA: Diagnosis not present

## 2019-12-29 DIAGNOSIS — Z86711 Personal history of pulmonary embolism: Secondary | ICD-10-CM | POA: Diagnosis not present

## 2019-12-29 DIAGNOSIS — G610 Guillain-Barre syndrome: Secondary | ICD-10-CM | POA: Diagnosis not present

## 2019-12-29 DIAGNOSIS — Z7902 Long term (current) use of antithrombotics/antiplatelets: Secondary | ICD-10-CM | POA: Diagnosis not present

## 2019-12-29 DIAGNOSIS — K641 Second degree hemorrhoids: Secondary | ICD-10-CM | POA: Diagnosis not present

## 2019-12-29 DIAGNOSIS — I4821 Permanent atrial fibrillation: Secondary | ICD-10-CM | POA: Diagnosis not present

## 2019-12-29 DIAGNOSIS — L309 Dermatitis, unspecified: Secondary | ICD-10-CM | POA: Diagnosis not present

## 2019-12-29 DIAGNOSIS — F4310 Post-traumatic stress disorder, unspecified: Secondary | ICD-10-CM | POA: Diagnosis not present

## 2019-12-29 DIAGNOSIS — Z720 Tobacco use: Secondary | ICD-10-CM | POA: Diagnosis not present

## 2019-12-29 DIAGNOSIS — R339 Retention of urine, unspecified: Secondary | ICD-10-CM | POA: Diagnosis not present

## 2019-12-29 DIAGNOSIS — I5022 Chronic systolic (congestive) heart failure: Secondary | ICD-10-CM | POA: Diagnosis not present

## 2019-12-29 DIAGNOSIS — K6289 Other specified diseases of anus and rectum: Secondary | ICD-10-CM | POA: Diagnosis not present

## 2019-12-29 DIAGNOSIS — M6281 Muscle weakness (generalized): Secondary | ICD-10-CM | POA: Diagnosis not present

## 2019-12-29 DIAGNOSIS — C187 Malignant neoplasm of sigmoid colon: Secondary | ICD-10-CM | POA: Diagnosis not present

## 2019-12-29 DIAGNOSIS — R52 Pain, unspecified: Secondary | ICD-10-CM | POA: Diagnosis not present

## 2019-12-29 DIAGNOSIS — M2550 Pain in unspecified joint: Secondary | ICD-10-CM | POA: Diagnosis not present

## 2019-12-29 DIAGNOSIS — F17210 Nicotine dependence, cigarettes, uncomplicated: Secondary | ICD-10-CM | POA: Diagnosis not present

## 2019-12-29 DIAGNOSIS — J939 Pneumothorax, unspecified: Secondary | ICD-10-CM | POA: Diagnosis not present

## 2019-12-29 DIAGNOSIS — K219 Gastro-esophageal reflux disease without esophagitis: Secondary | ICD-10-CM | POA: Diagnosis not present

## 2019-12-29 DIAGNOSIS — C3412 Malignant neoplasm of upper lobe, left bronchus or lung: Secondary | ICD-10-CM | POA: Diagnosis not present

## 2019-12-29 DIAGNOSIS — Z792 Long term (current) use of antibiotics: Secondary | ICD-10-CM | POA: Diagnosis not present

## 2019-12-29 DIAGNOSIS — I214 Non-ST elevation (NSTEMI) myocardial infarction: Secondary | ICD-10-CM | POA: Diagnosis not present

## 2019-12-29 DIAGNOSIS — Z79899 Other long term (current) drug therapy: Secondary | ICD-10-CM | POA: Diagnosis not present

## 2019-12-29 DIAGNOSIS — G9009 Other idiopathic peripheral autonomic neuropathy: Secondary | ICD-10-CM | POA: Diagnosis not present

## 2019-12-29 DIAGNOSIS — L97509 Non-pressure chronic ulcer of other part of unspecified foot with unspecified severity: Secondary | ICD-10-CM | POA: Diagnosis not present

## 2019-12-29 DIAGNOSIS — R310 Gross hematuria: Secondary | ICD-10-CM | POA: Diagnosis not present

## 2019-12-29 DIAGNOSIS — F325 Major depressive disorder, single episode, in full remission: Secondary | ICD-10-CM | POA: Diagnosis not present

## 2019-12-29 DIAGNOSIS — R6884 Jaw pain: Secondary | ICD-10-CM | POA: Diagnosis not present

## 2019-12-29 DIAGNOSIS — D6859 Other primary thrombophilia: Secondary | ICD-10-CM | POA: Diagnosis not present

## 2019-12-29 DIAGNOSIS — Z5112 Encounter for antineoplastic immunotherapy: Secondary | ICD-10-CM | POA: Diagnosis not present

## 2019-12-29 DIAGNOSIS — Z9049 Acquired absence of other specified parts of digestive tract: Secondary | ICD-10-CM | POA: Diagnosis not present

## 2019-12-29 DIAGNOSIS — N390 Urinary tract infection, site not specified: Secondary | ICD-10-CM | POA: Diagnosis not present

## 2019-12-29 DIAGNOSIS — E1121 Type 2 diabetes mellitus with diabetic nephropathy: Secondary | ICD-10-CM | POA: Diagnosis not present

## 2019-12-29 DIAGNOSIS — K4091 Unilateral inguinal hernia, without obstruction or gangrene, recurrent: Secondary | ICD-10-CM | POA: Diagnosis not present

## 2019-12-29 DIAGNOSIS — R7301 Impaired fasting glucose: Secondary | ICD-10-CM | POA: Diagnosis not present

## 2019-12-29 DIAGNOSIS — E7849 Other hyperlipidemia: Secondary | ICD-10-CM | POA: Diagnosis not present

## 2019-12-29 DIAGNOSIS — I259 Chronic ischemic heart disease, unspecified: Secondary | ICD-10-CM | POA: Diagnosis not present

## 2019-12-29 DIAGNOSIS — E291 Testicular hypofunction: Secondary | ICD-10-CM | POA: Diagnosis not present

## 2019-12-29 DIAGNOSIS — E1151 Type 2 diabetes mellitus with diabetic peripheral angiopathy without gangrene: Secondary | ICD-10-CM | POA: Diagnosis not present

## 2019-12-29 DIAGNOSIS — D696 Thrombocytopenia, unspecified: Secondary | ICD-10-CM | POA: Diagnosis not present

## 2019-12-29 DIAGNOSIS — M1990 Unspecified osteoarthritis, unspecified site: Secondary | ICD-10-CM | POA: Diagnosis not present

## 2019-12-29 DIAGNOSIS — F329 Major depressive disorder, single episode, unspecified: Secondary | ICD-10-CM | POA: Diagnosis not present

## 2019-12-30 DIAGNOSIS — M546 Pain in thoracic spine: Secondary | ICD-10-CM | POA: Diagnosis not present

## 2019-12-30 DIAGNOSIS — M1710 Unilateral primary osteoarthritis, unspecified knee: Secondary | ICD-10-CM | POA: Diagnosis not present

## 2019-12-30 DIAGNOSIS — M9903 Segmental and somatic dysfunction of lumbar region: Secondary | ICD-10-CM | POA: Diagnosis not present

## 2019-12-30 DIAGNOSIS — G609 Hereditary and idiopathic neuropathy, unspecified: Secondary | ICD-10-CM | POA: Diagnosis not present

## 2019-12-30 DIAGNOSIS — J939 Pneumothorax, unspecified: Secondary | ICD-10-CM | POA: Diagnosis not present

## 2019-12-30 DIAGNOSIS — M9905 Segmental and somatic dysfunction of pelvic region: Secondary | ICD-10-CM | POA: Diagnosis not present

## 2019-12-30 DIAGNOSIS — M5412 Radiculopathy, cervical region: Secondary | ICD-10-CM | POA: Diagnosis not present

## 2019-12-30 DIAGNOSIS — G959 Disease of spinal cord, unspecified: Secondary | ICD-10-CM | POA: Diagnosis not present

## 2019-12-30 DIAGNOSIS — M955 Acquired deformity of pelvis: Secondary | ICD-10-CM | POA: Diagnosis not present

## 2019-12-30 DIAGNOSIS — N83202 Unspecified ovarian cyst, left side: Secondary | ICD-10-CM | POA: Diagnosis not present

## 2019-12-30 DIAGNOSIS — Z7689 Persons encountering health services in other specified circumstances: Secondary | ICD-10-CM | POA: Diagnosis not present

## 2019-12-30 DIAGNOSIS — I130 Hypertensive heart and chronic kidney disease with heart failure and stage 1 through stage 4 chronic kidney disease, or unspecified chronic kidney disease: Secondary | ICD-10-CM | POA: Diagnosis not present

## 2019-12-30 DIAGNOSIS — M1712 Unilateral primary osteoarthritis, left knee: Secondary | ICD-10-CM | POA: Diagnosis not present

## 2019-12-30 DIAGNOSIS — R112 Nausea with vomiting, unspecified: Secondary | ICD-10-CM | POA: Diagnosis not present

## 2019-12-30 DIAGNOSIS — M953 Acquired deformity of neck: Secondary | ICD-10-CM | POA: Diagnosis not present

## 2019-12-30 DIAGNOSIS — I0981 Rheumatic heart failure: Secondary | ICD-10-CM | POA: Diagnosis not present

## 2019-12-30 DIAGNOSIS — R634 Abnormal weight loss: Secondary | ICD-10-CM | POA: Diagnosis not present

## 2019-12-30 DIAGNOSIS — K219 Gastro-esophageal reflux disease without esophagitis: Secondary | ICD-10-CM | POA: Diagnosis not present

## 2019-12-30 DIAGNOSIS — D689 Coagulation defect, unspecified: Secondary | ICD-10-CM | POA: Diagnosis not present

## 2019-12-30 DIAGNOSIS — E1122 Type 2 diabetes mellitus with diabetic chronic kidney disease: Secondary | ICD-10-CM | POA: Diagnosis not present

## 2019-12-30 DIAGNOSIS — E039 Hypothyroidism, unspecified: Secondary | ICD-10-CM | POA: Diagnosis not present

## 2019-12-30 DIAGNOSIS — M545 Low back pain: Secondary | ICD-10-CM | POA: Diagnosis not present

## 2019-12-30 DIAGNOSIS — R0902 Hypoxemia: Secondary | ICD-10-CM | POA: Diagnosis not present

## 2019-12-30 DIAGNOSIS — R200 Anesthesia of skin: Secondary | ICD-10-CM | POA: Diagnosis not present

## 2019-12-30 DIAGNOSIS — I2510 Atherosclerotic heart disease of native coronary artery without angina pectoris: Secondary | ICD-10-CM | POA: Diagnosis not present

## 2019-12-30 DIAGNOSIS — M4802 Spinal stenosis, cervical region: Secondary | ICD-10-CM | POA: Diagnosis not present

## 2019-12-30 DIAGNOSIS — E1169 Type 2 diabetes mellitus with other specified complication: Secondary | ICD-10-CM | POA: Diagnosis not present

## 2019-12-30 DIAGNOSIS — M79676 Pain in unspecified toe(s): Secondary | ICD-10-CM | POA: Diagnosis not present

## 2019-12-30 DIAGNOSIS — M9902 Segmental and somatic dysfunction of thoracic region: Secondary | ICD-10-CM | POA: Diagnosis not present

## 2019-12-30 DIAGNOSIS — M4147 Neuromuscular scoliosis, lumbosacral region: Secondary | ICD-10-CM | POA: Diagnosis not present

## 2019-12-30 DIAGNOSIS — M12811 Other specific arthropathies, not elsewhere classified, right shoulder: Secondary | ICD-10-CM | POA: Diagnosis not present

## 2019-12-30 DIAGNOSIS — N189 Chronic kidney disease, unspecified: Secondary | ICD-10-CM | POA: Diagnosis not present

## 2019-12-30 DIAGNOSIS — L89620 Pressure ulcer of left heel, unstageable: Secondary | ICD-10-CM | POA: Diagnosis not present

## 2019-12-30 DIAGNOSIS — Z87442 Personal history of urinary calculi: Secondary | ICD-10-CM | POA: Diagnosis not present

## 2019-12-30 DIAGNOSIS — R252 Cramp and spasm: Secondary | ICD-10-CM | POA: Diagnosis not present

## 2019-12-30 DIAGNOSIS — M47816 Spondylosis without myelopathy or radiculopathy, lumbar region: Secondary | ICD-10-CM | POA: Diagnosis not present

## 2019-12-30 DIAGNOSIS — D500 Iron deficiency anemia secondary to blood loss (chronic): Secondary | ICD-10-CM | POA: Diagnosis not present

## 2019-12-30 DIAGNOSIS — L989 Disorder of the skin and subcutaneous tissue, unspecified: Secondary | ICD-10-CM | POA: Diagnosis not present

## 2019-12-30 DIAGNOSIS — G309 Alzheimer's disease, unspecified: Secondary | ICD-10-CM | POA: Diagnosis not present

## 2019-12-30 DIAGNOSIS — Z79899 Other long term (current) drug therapy: Secondary | ICD-10-CM | POA: Diagnosis not present

## 2019-12-30 DIAGNOSIS — K51819 Other ulcerative colitis with unspecified complications: Secondary | ICD-10-CM | POA: Diagnosis not present

## 2019-12-30 DIAGNOSIS — M4807 Spinal stenosis, lumbosacral region: Secondary | ICD-10-CM | POA: Diagnosis not present

## 2019-12-30 DIAGNOSIS — I4821 Permanent atrial fibrillation: Secondary | ICD-10-CM | POA: Diagnosis not present

## 2019-12-30 DIAGNOSIS — G459 Transient cerebral ischemic attack, unspecified: Secondary | ICD-10-CM | POA: Diagnosis not present

## 2019-12-30 DIAGNOSIS — I428 Other cardiomyopathies: Secondary | ICD-10-CM | POA: Diagnosis not present

## 2019-12-30 DIAGNOSIS — M960 Pseudarthrosis after fusion or arthrodesis: Secondary | ICD-10-CM | POA: Diagnosis not present

## 2019-12-30 DIAGNOSIS — K259 Gastric ulcer, unspecified as acute or chronic, without hemorrhage or perforation: Secondary | ICD-10-CM | POA: Diagnosis not present

## 2019-12-30 DIAGNOSIS — M810 Age-related osteoporosis without current pathological fracture: Secondary | ICD-10-CM | POA: Diagnosis not present

## 2019-12-30 DIAGNOSIS — K922 Gastrointestinal hemorrhage, unspecified: Secondary | ICD-10-CM | POA: Diagnosis not present

## 2019-12-30 DIAGNOSIS — I5042 Chronic combined systolic (congestive) and diastolic (congestive) heart failure: Secondary | ICD-10-CM | POA: Diagnosis not present

## 2019-12-30 DIAGNOSIS — N186 End stage renal disease: Secondary | ICD-10-CM | POA: Diagnosis not present

## 2019-12-30 DIAGNOSIS — M5441 Lumbago with sciatica, right side: Secondary | ICD-10-CM | POA: Diagnosis not present

## 2019-12-30 DIAGNOSIS — K047 Periapical abscess without sinus: Secondary | ICD-10-CM | POA: Diagnosis not present

## 2019-12-30 DIAGNOSIS — E559 Vitamin D deficiency, unspecified: Secondary | ICD-10-CM | POA: Diagnosis not present

## 2019-12-30 DIAGNOSIS — K920 Hematemesis: Secondary | ICD-10-CM | POA: Diagnosis not present

## 2019-12-30 DIAGNOSIS — M79605 Pain in left leg: Secondary | ICD-10-CM | POA: Diagnosis not present

## 2019-12-30 DIAGNOSIS — F419 Anxiety disorder, unspecified: Secondary | ICD-10-CM | POA: Diagnosis not present

## 2019-12-30 DIAGNOSIS — I340 Nonrheumatic mitral (valve) insufficiency: Secondary | ICD-10-CM | POA: Diagnosis not present

## 2019-12-30 DIAGNOSIS — M47812 Spondylosis without myelopathy or radiculopathy, cervical region: Secondary | ICD-10-CM | POA: Diagnosis not present

## 2019-12-30 DIAGNOSIS — C61 Malignant neoplasm of prostate: Secondary | ICD-10-CM | POA: Diagnosis not present

## 2019-12-30 DIAGNOSIS — Z992 Dependence on renal dialysis: Secondary | ICD-10-CM | POA: Diagnosis not present

## 2019-12-30 DIAGNOSIS — N183 Chronic kidney disease, stage 3 unspecified: Secondary | ICD-10-CM | POA: Diagnosis not present

## 2019-12-30 DIAGNOSIS — L89150 Pressure ulcer of sacral region, unstageable: Secondary | ICD-10-CM | POA: Diagnosis not present

## 2019-12-30 DIAGNOSIS — R300 Dysuria: Secondary | ICD-10-CM | POA: Diagnosis not present

## 2019-12-30 DIAGNOSIS — F0390 Unspecified dementia without behavioral disturbance: Secondary | ICD-10-CM | POA: Diagnosis not present

## 2019-12-30 DIAGNOSIS — M5430 Sciatica, unspecified side: Secondary | ICD-10-CM | POA: Diagnosis not present

## 2019-12-30 DIAGNOSIS — G4709 Other insomnia: Secondary | ICD-10-CM | POA: Diagnosis not present

## 2019-12-30 DIAGNOSIS — I10 Essential (primary) hypertension: Secondary | ICD-10-CM | POA: Diagnosis not present

## 2019-12-30 DIAGNOSIS — E782 Mixed hyperlipidemia: Secondary | ICD-10-CM | POA: Diagnosis not present

## 2019-12-30 DIAGNOSIS — I4891 Unspecified atrial fibrillation: Secondary | ICD-10-CM | POA: Diagnosis not present

## 2019-12-30 DIAGNOSIS — K50919 Crohn's disease, unspecified, with unspecified complications: Secondary | ICD-10-CM | POA: Diagnosis not present

## 2019-12-30 DIAGNOSIS — Z20822 Contact with and (suspected) exposure to covid-19: Secondary | ICD-10-CM | POA: Diagnosis not present

## 2019-12-30 DIAGNOSIS — I129 Hypertensive chronic kidney disease with stage 1 through stage 4 chronic kidney disease, or unspecified chronic kidney disease: Secondary | ICD-10-CM | POA: Diagnosis not present

## 2019-12-30 DIAGNOSIS — M40292 Other kyphosis, cervical region: Secondary | ICD-10-CM | POA: Diagnosis not present

## 2019-12-30 DIAGNOSIS — I447 Left bundle-branch block, unspecified: Secondary | ICD-10-CM | POA: Diagnosis not present

## 2019-12-30 DIAGNOSIS — Z96651 Presence of right artificial knee joint: Secondary | ICD-10-CM | POA: Diagnosis not present

## 2019-12-30 DIAGNOSIS — F17201 Nicotine dependence, unspecified, in remission: Secondary | ICD-10-CM | POA: Diagnosis not present

## 2019-12-30 DIAGNOSIS — R809 Proteinuria, unspecified: Secondary | ICD-10-CM | POA: Diagnosis not present

## 2019-12-30 DIAGNOSIS — Z87440 Personal history of urinary (tract) infections: Secondary | ICD-10-CM | POA: Diagnosis not present

## 2019-12-30 DIAGNOSIS — F4321 Adjustment disorder with depressed mood: Secondary | ICD-10-CM | POA: Diagnosis not present

## 2019-12-30 DIAGNOSIS — K921 Melena: Secondary | ICD-10-CM | POA: Diagnosis not present

## 2019-12-30 DIAGNOSIS — G894 Chronic pain syndrome: Secondary | ICD-10-CM | POA: Diagnosis not present

## 2019-12-30 DIAGNOSIS — N179 Acute kidney failure, unspecified: Secondary | ICD-10-CM | POA: Diagnosis not present

## 2019-12-30 DIAGNOSIS — B351 Tinea unguium: Secondary | ICD-10-CM | POA: Diagnosis not present

## 2019-12-30 DIAGNOSIS — D692 Other nonthrombocytopenic purpura: Secondary | ICD-10-CM | POA: Diagnosis not present

## 2019-12-30 DIAGNOSIS — R5383 Other fatigue: Secondary | ICD-10-CM | POA: Diagnosis not present

## 2019-12-30 DIAGNOSIS — N83201 Unspecified ovarian cyst, right side: Secondary | ICD-10-CM | POA: Diagnosis not present

## 2019-12-30 DIAGNOSIS — S83231A Complex tear of medial meniscus, current injury, right knee, initial encounter: Secondary | ICD-10-CM | POA: Diagnosis not present

## 2019-12-30 DIAGNOSIS — E7800 Pure hypercholesterolemia, unspecified: Secondary | ICD-10-CM | POA: Diagnosis not present

## 2019-12-30 DIAGNOSIS — L89610 Pressure ulcer of right heel, unstageable: Secondary | ICD-10-CM | POA: Diagnosis not present

## 2019-12-30 DIAGNOSIS — M961 Postlaminectomy syndrome, not elsewhere classified: Secondary | ICD-10-CM | POA: Diagnosis not present

## 2019-12-30 DIAGNOSIS — R32 Unspecified urinary incontinence: Secondary | ICD-10-CM | POA: Diagnosis not present

## 2019-12-30 DIAGNOSIS — N2581 Secondary hyperparathyroidism of renal origin: Secondary | ICD-10-CM | POA: Diagnosis not present

## 2019-12-30 DIAGNOSIS — M5030 Other cervical disc degeneration, unspecified cervical region: Secondary | ICD-10-CM | POA: Diagnosis not present

## 2019-12-30 DIAGNOSIS — Z0000 Encounter for general adult medical examination without abnormal findings: Secondary | ICD-10-CM | POA: Diagnosis not present

## 2019-12-30 DIAGNOSIS — M1711 Unilateral primary osteoarthritis, right knee: Secondary | ICD-10-CM | POA: Diagnosis not present

## 2019-12-30 DIAGNOSIS — M160 Bilateral primary osteoarthritis of hip: Secondary | ICD-10-CM | POA: Diagnosis not present

## 2019-12-30 DIAGNOSIS — I5021 Acute systolic (congestive) heart failure: Secondary | ICD-10-CM | POA: Diagnosis not present

## 2019-12-30 DIAGNOSIS — R42 Dizziness and giddiness: Secondary | ICD-10-CM | POA: Diagnosis not present

## 2019-12-30 DIAGNOSIS — I420 Dilated cardiomyopathy: Secondary | ICD-10-CM | POA: Diagnosis not present

## 2019-12-30 DIAGNOSIS — K254 Chronic or unspecified gastric ulcer with hemorrhage: Secondary | ICD-10-CM | POA: Diagnosis not present

## 2019-12-30 DIAGNOSIS — I69351 Hemiplegia and hemiparesis following cerebral infarction affecting right dominant side: Secondary | ICD-10-CM | POA: Diagnosis not present

## 2019-12-30 DIAGNOSIS — M79641 Pain in right hand: Secondary | ICD-10-CM | POA: Diagnosis not present

## 2019-12-30 DIAGNOSIS — Z79891 Long term (current) use of opiate analgesic: Secondary | ICD-10-CM | POA: Diagnosis not present

## 2019-12-30 DIAGNOSIS — J449 Chronic obstructive pulmonary disease, unspecified: Secondary | ICD-10-CM | POA: Diagnosis not present

## 2019-12-30 DIAGNOSIS — R519 Headache, unspecified: Secondary | ICD-10-CM | POA: Diagnosis not present

## 2019-12-30 DIAGNOSIS — U071 COVID-19: Secondary | ICD-10-CM | POA: Diagnosis not present

## 2019-12-30 DIAGNOSIS — Z1231 Encounter for screening mammogram for malignant neoplasm of breast: Secondary | ICD-10-CM | POA: Diagnosis not present

## 2019-12-30 DIAGNOSIS — Z1389 Encounter for screening for other disorder: Secondary | ICD-10-CM | POA: Diagnosis not present

## 2019-12-30 DIAGNOSIS — J9601 Acute respiratory failure with hypoxia: Secondary | ICD-10-CM | POA: Diagnosis not present

## 2019-12-30 DIAGNOSIS — H35033 Hypertensive retinopathy, bilateral: Secondary | ICD-10-CM | POA: Diagnosis not present

## 2019-12-30 DIAGNOSIS — M5136 Other intervertebral disc degeneration, lumbar region: Secondary | ICD-10-CM | POA: Diagnosis not present

## 2019-12-31 DIAGNOSIS — Z961 Presence of intraocular lens: Secondary | ICD-10-CM | POA: Diagnosis not present

## 2019-12-31 DIAGNOSIS — I4891 Unspecified atrial fibrillation: Secondary | ICD-10-CM | POA: Diagnosis not present

## 2019-12-31 DIAGNOSIS — Z0000 Encounter for general adult medical examination without abnormal findings: Secondary | ICD-10-CM | POA: Diagnosis not present

## 2019-12-31 DIAGNOSIS — U071 COVID-19: Secondary | ICD-10-CM | POA: Diagnosis not present

## 2019-12-31 DIAGNOSIS — G20 Parkinson's disease: Secondary | ICD-10-CM | POA: Diagnosis not present

## 2019-12-31 DIAGNOSIS — I69322 Dysarthria following cerebral infarction: Secondary | ICD-10-CM | POA: Diagnosis not present

## 2019-12-31 DIAGNOSIS — E1140 Type 2 diabetes mellitus with diabetic neuropathy, unspecified: Secondary | ICD-10-CM | POA: Diagnosis not present

## 2019-12-31 DIAGNOSIS — Z853 Personal history of malignant neoplasm of breast: Secondary | ICD-10-CM | POA: Diagnosis not present

## 2019-12-31 DIAGNOSIS — G4700 Insomnia, unspecified: Secondary | ICD-10-CM | POA: Diagnosis not present

## 2019-12-31 DIAGNOSIS — M19039 Primary osteoarthritis, unspecified wrist: Secondary | ICD-10-CM | POA: Diagnosis not present

## 2019-12-31 DIAGNOSIS — H35033 Hypertensive retinopathy, bilateral: Secondary | ICD-10-CM | POA: Diagnosis not present

## 2019-12-31 DIAGNOSIS — I5040 Unspecified combined systolic (congestive) and diastolic (congestive) heart failure: Secondary | ICD-10-CM | POA: Diagnosis not present

## 2019-12-31 DIAGNOSIS — E782 Mixed hyperlipidemia: Secondary | ICD-10-CM | POA: Diagnosis not present

## 2019-12-31 DIAGNOSIS — F332 Major depressive disorder, recurrent severe without psychotic features: Secondary | ICD-10-CM | POA: Diagnosis not present

## 2019-12-31 DIAGNOSIS — Z01812 Encounter for preprocedural laboratory examination: Secondary | ICD-10-CM | POA: Diagnosis not present

## 2019-12-31 DIAGNOSIS — D631 Anemia in chronic kidney disease: Secondary | ICD-10-CM | POA: Diagnosis not present

## 2019-12-31 DIAGNOSIS — Z79899 Other long term (current) drug therapy: Secondary | ICD-10-CM | POA: Diagnosis not present

## 2019-12-31 DIAGNOSIS — M955 Acquired deformity of pelvis: Secondary | ICD-10-CM | POA: Diagnosis not present

## 2019-12-31 DIAGNOSIS — E118 Type 2 diabetes mellitus with unspecified complications: Secondary | ICD-10-CM | POA: Diagnosis not present

## 2019-12-31 DIAGNOSIS — J45909 Unspecified asthma, uncomplicated: Secondary | ICD-10-CM | POA: Diagnosis not present

## 2019-12-31 DIAGNOSIS — H18413 Arcus senilis, bilateral: Secondary | ICD-10-CM | POA: Diagnosis not present

## 2019-12-31 DIAGNOSIS — Z950 Presence of cardiac pacemaker: Secondary | ICD-10-CM | POA: Diagnosis not present

## 2019-12-31 DIAGNOSIS — Z8546 Personal history of malignant neoplasm of prostate: Secondary | ICD-10-CM | POA: Diagnosis not present

## 2019-12-31 DIAGNOSIS — M25552 Pain in left hip: Secondary | ICD-10-CM | POA: Diagnosis not present

## 2019-12-31 DIAGNOSIS — E559 Vitamin D deficiency, unspecified: Secondary | ICD-10-CM | POA: Diagnosis not present

## 2019-12-31 DIAGNOSIS — I739 Peripheral vascular disease, unspecified: Secondary | ICD-10-CM | POA: Diagnosis not present

## 2019-12-31 DIAGNOSIS — M5442 Lumbago with sciatica, left side: Secondary | ICD-10-CM | POA: Diagnosis not present

## 2019-12-31 DIAGNOSIS — J441 Chronic obstructive pulmonary disease with (acute) exacerbation: Secondary | ICD-10-CM | POA: Diagnosis not present

## 2019-12-31 DIAGNOSIS — Z01818 Encounter for other preprocedural examination: Secondary | ICD-10-CM | POA: Diagnosis not present

## 2019-12-31 DIAGNOSIS — H11823 Conjunctivochalasis, bilateral: Secondary | ICD-10-CM | POA: Diagnosis not present

## 2019-12-31 DIAGNOSIS — Z139 Encounter for screening, unspecified: Secondary | ICD-10-CM | POA: Diagnosis not present

## 2019-12-31 DIAGNOSIS — J9601 Acute respiratory failure with hypoxia: Secondary | ICD-10-CM | POA: Diagnosis not present

## 2019-12-31 DIAGNOSIS — Z7902 Long term (current) use of antithrombotics/antiplatelets: Secondary | ICD-10-CM | POA: Diagnosis not present

## 2019-12-31 DIAGNOSIS — J449 Chronic obstructive pulmonary disease, unspecified: Secondary | ICD-10-CM | POA: Diagnosis not present

## 2019-12-31 DIAGNOSIS — I10 Essential (primary) hypertension: Secondary | ICD-10-CM | POA: Diagnosis not present

## 2019-12-31 DIAGNOSIS — E7800 Pure hypercholesterolemia, unspecified: Secondary | ICD-10-CM | POA: Diagnosis not present

## 2019-12-31 DIAGNOSIS — E1159 Type 2 diabetes mellitus with other circulatory complications: Secondary | ICD-10-CM | POA: Diagnosis not present

## 2019-12-31 DIAGNOSIS — M9901 Segmental and somatic dysfunction of cervical region: Secondary | ICD-10-CM | POA: Diagnosis not present

## 2019-12-31 DIAGNOSIS — M531 Cervicobrachial syndrome: Secondary | ICD-10-CM | POA: Diagnosis not present

## 2019-12-31 DIAGNOSIS — Z6822 Body mass index (BMI) 22.0-22.9, adult: Secondary | ICD-10-CM | POA: Diagnosis not present

## 2019-12-31 DIAGNOSIS — E1165 Type 2 diabetes mellitus with hyperglycemia: Secondary | ICD-10-CM | POA: Diagnosis not present

## 2019-12-31 DIAGNOSIS — I5023 Acute on chronic systolic (congestive) heart failure: Secondary | ICD-10-CM | POA: Diagnosis not present

## 2019-12-31 DIAGNOSIS — I420 Dilated cardiomyopathy: Secondary | ICD-10-CM | POA: Diagnosis not present

## 2019-12-31 DIAGNOSIS — N400 Enlarged prostate without lower urinary tract symptoms: Secondary | ICD-10-CM | POA: Diagnosis not present

## 2019-12-31 DIAGNOSIS — H02834 Dermatochalasis of left upper eyelid: Secondary | ICD-10-CM | POA: Diagnosis not present

## 2019-12-31 DIAGNOSIS — R270 Ataxia, unspecified: Secondary | ICD-10-CM | POA: Diagnosis not present

## 2019-12-31 DIAGNOSIS — I5021 Acute systolic (congestive) heart failure: Secondary | ICD-10-CM | POA: Diagnosis not present

## 2019-12-31 DIAGNOSIS — F319 Bipolar disorder, unspecified: Secondary | ICD-10-CM | POA: Diagnosis not present

## 2019-12-31 DIAGNOSIS — J3489 Other specified disorders of nose and nasal sinuses: Secondary | ICD-10-CM | POA: Diagnosis not present

## 2019-12-31 DIAGNOSIS — E119 Type 2 diabetes mellitus without complications: Secondary | ICD-10-CM | POA: Diagnosis not present

## 2019-12-31 DIAGNOSIS — K5900 Constipation, unspecified: Secondary | ICD-10-CM | POA: Diagnosis not present

## 2019-12-31 DIAGNOSIS — D500 Iron deficiency anemia secondary to blood loss (chronic): Secondary | ICD-10-CM | POA: Diagnosis not present

## 2019-12-31 DIAGNOSIS — M9903 Segmental and somatic dysfunction of lumbar region: Secondary | ICD-10-CM | POA: Diagnosis not present

## 2019-12-31 DIAGNOSIS — N179 Acute kidney failure, unspecified: Secondary | ICD-10-CM | POA: Diagnosis not present

## 2019-12-31 DIAGNOSIS — Z20822 Contact with and (suspected) exposure to covid-19: Secondary | ICD-10-CM | POA: Diagnosis not present

## 2019-12-31 DIAGNOSIS — R809 Proteinuria, unspecified: Secondary | ICD-10-CM | POA: Diagnosis not present

## 2019-12-31 DIAGNOSIS — Z125 Encounter for screening for malignant neoplasm of prostate: Secondary | ICD-10-CM | POA: Diagnosis not present

## 2019-12-31 DIAGNOSIS — Z7982 Long term (current) use of aspirin: Secondary | ICD-10-CM | POA: Diagnosis not present

## 2019-12-31 DIAGNOSIS — I480 Paroxysmal atrial fibrillation: Secondary | ICD-10-CM | POA: Diagnosis not present

## 2019-12-31 DIAGNOSIS — E1142 Type 2 diabetes mellitus with diabetic polyneuropathy: Secondary | ICD-10-CM | POA: Diagnosis not present

## 2019-12-31 DIAGNOSIS — F325 Major depressive disorder, single episode, in full remission: Secondary | ICD-10-CM | POA: Diagnosis not present

## 2019-12-31 DIAGNOSIS — E1169 Type 2 diabetes mellitus with other specified complication: Secondary | ICD-10-CM | POA: Diagnosis not present

## 2019-12-31 DIAGNOSIS — R482 Apraxia: Secondary | ICD-10-CM | POA: Diagnosis not present

## 2019-12-31 DIAGNOSIS — H903 Sensorineural hearing loss, bilateral: Secondary | ICD-10-CM | POA: Diagnosis not present

## 2019-12-31 DIAGNOSIS — N3941 Urge incontinence: Secondary | ICD-10-CM | POA: Diagnosis not present

## 2019-12-31 DIAGNOSIS — D638 Anemia in other chronic diseases classified elsewhere: Secondary | ICD-10-CM | POA: Diagnosis not present

## 2019-12-31 DIAGNOSIS — T8189XA Other complications of procedures, not elsewhere classified, initial encounter: Secondary | ICD-10-CM | POA: Diagnosis not present

## 2019-12-31 DIAGNOSIS — K7460 Unspecified cirrhosis of liver: Secondary | ICD-10-CM | POA: Diagnosis not present

## 2019-12-31 DIAGNOSIS — M9905 Segmental and somatic dysfunction of pelvic region: Secondary | ICD-10-CM | POA: Diagnosis not present

## 2019-12-31 DIAGNOSIS — M79605 Pain in left leg: Secondary | ICD-10-CM | POA: Diagnosis not present

## 2019-12-31 DIAGNOSIS — D649 Anemia, unspecified: Secondary | ICD-10-CM | POA: Diagnosis not present

## 2019-12-31 DIAGNOSIS — R945 Abnormal results of liver function studies: Secondary | ICD-10-CM | POA: Diagnosis not present

## 2019-12-31 DIAGNOSIS — M1711 Unilateral primary osteoarthritis, right knee: Secondary | ICD-10-CM | POA: Diagnosis not present

## 2019-12-31 DIAGNOSIS — J439 Emphysema, unspecified: Secondary | ICD-10-CM | POA: Diagnosis not present

## 2019-12-31 DIAGNOSIS — H25812 Combined forms of age-related cataract, left eye: Secondary | ICD-10-CM | POA: Diagnosis not present

## 2019-12-31 DIAGNOSIS — E785 Hyperlipidemia, unspecified: Secondary | ICD-10-CM | POA: Diagnosis not present

## 2019-12-31 DIAGNOSIS — I340 Nonrheumatic mitral (valve) insufficiency: Secondary | ICD-10-CM | POA: Diagnosis not present

## 2019-12-31 DIAGNOSIS — I69351 Hemiplegia and hemiparesis following cerebral infarction affecting right dominant side: Secondary | ICD-10-CM | POA: Diagnosis not present

## 2019-12-31 DIAGNOSIS — M47812 Spondylosis without myelopathy or radiculopathy, cervical region: Secondary | ICD-10-CM | POA: Diagnosis not present

## 2019-12-31 DIAGNOSIS — Z8543 Personal history of malignant neoplasm of ovary: Secondary | ICD-10-CM | POA: Diagnosis not present

## 2019-12-31 DIAGNOSIS — H40012 Open angle with borderline findings, low risk, left eye: Secondary | ICD-10-CM | POA: Diagnosis not present

## 2019-12-31 DIAGNOSIS — M316 Other giant cell arteritis: Secondary | ICD-10-CM | POA: Diagnosis not present

## 2019-12-31 DIAGNOSIS — N1831 Chronic kidney disease, stage 3a: Secondary | ICD-10-CM | POA: Diagnosis not present

## 2019-12-31 DIAGNOSIS — J939 Pneumothorax, unspecified: Secondary | ICD-10-CM | POA: Diagnosis not present

## 2019-12-31 DIAGNOSIS — H0288B Meibomian gland dysfunction left eye, upper and lower eyelids: Secondary | ICD-10-CM | POA: Diagnosis not present

## 2019-12-31 DIAGNOSIS — F424 Excoriation (skin-picking) disorder: Secondary | ICD-10-CM | POA: Diagnosis not present

## 2019-12-31 DIAGNOSIS — Z20828 Contact with and (suspected) exposure to other viral communicable diseases: Secondary | ICD-10-CM | POA: Diagnosis not present

## 2019-12-31 DIAGNOSIS — R399 Unspecified symptoms and signs involving the genitourinary system: Secondary | ICD-10-CM | POA: Diagnosis not present

## 2019-12-31 DIAGNOSIS — M8580 Other specified disorders of bone density and structure, unspecified site: Secondary | ICD-10-CM | POA: Diagnosis not present

## 2019-12-31 DIAGNOSIS — J189 Pneumonia, unspecified organism: Secondary | ICD-10-CM | POA: Diagnosis not present

## 2019-12-31 DIAGNOSIS — H0288A Meibomian gland dysfunction right eye, upper and lower eyelids: Secondary | ICD-10-CM | POA: Diagnosis not present

## 2019-12-31 DIAGNOSIS — Z8616 Personal history of COVID-19: Secondary | ICD-10-CM | POA: Diagnosis not present

## 2019-12-31 DIAGNOSIS — R0902 Hypoxemia: Secondary | ICD-10-CM | POA: Diagnosis not present

## 2019-12-31 DIAGNOSIS — E039 Hypothyroidism, unspecified: Secondary | ICD-10-CM | POA: Diagnosis not present

## 2019-12-31 DIAGNOSIS — G720 Drug-induced myopathy: Secondary | ICD-10-CM | POA: Diagnosis not present

## 2019-12-31 DIAGNOSIS — C61 Malignant neoplasm of prostate: Secondary | ICD-10-CM | POA: Diagnosis not present

## 2019-12-31 DIAGNOSIS — K254 Chronic or unspecified gastric ulcer with hemorrhage: Secondary | ICD-10-CM | POA: Diagnosis not present

## 2019-12-31 DIAGNOSIS — R570 Cardiogenic shock: Secondary | ICD-10-CM | POA: Diagnosis not present

## 2019-12-31 DIAGNOSIS — G4733 Obstructive sleep apnea (adult) (pediatric): Secondary | ICD-10-CM | POA: Diagnosis not present

## 2019-12-31 DIAGNOSIS — I4821 Permanent atrial fibrillation: Secondary | ICD-10-CM | POA: Diagnosis not present

## 2019-12-31 DIAGNOSIS — H02831 Dermatochalasis of right upper eyelid: Secondary | ICD-10-CM | POA: Diagnosis not present

## 2019-12-31 DIAGNOSIS — M189 Osteoarthritis of first carpometacarpal joint, unspecified: Secondary | ICD-10-CM | POA: Diagnosis not present

## 2019-12-31 DIAGNOSIS — K921 Melena: Secondary | ICD-10-CM | POA: Diagnosis not present

## 2019-12-31 DIAGNOSIS — I2510 Atherosclerotic heart disease of native coronary artery without angina pectoris: Secondary | ICD-10-CM | POA: Diagnosis not present

## 2019-12-31 DIAGNOSIS — H16223 Keratoconjunctivitis sicca, not specified as Sjogren's, bilateral: Secondary | ICD-10-CM | POA: Diagnosis not present

## 2019-12-31 DIAGNOSIS — N200 Calculus of kidney: Secondary | ICD-10-CM | POA: Diagnosis not present

## 2020-01-01 DIAGNOSIS — J939 Pneumothorax, unspecified: Secondary | ICD-10-CM | POA: Diagnosis not present

## 2020-01-01 DIAGNOSIS — K219 Gastro-esophageal reflux disease without esophagitis: Secondary | ICD-10-CM | POA: Diagnosis not present

## 2020-01-01 DIAGNOSIS — Z794 Long term (current) use of insulin: Secondary | ICD-10-CM | POA: Diagnosis not present

## 2020-01-01 DIAGNOSIS — M109 Gout, unspecified: Secondary | ICD-10-CM | POA: Diagnosis not present

## 2020-01-01 DIAGNOSIS — U071 COVID-19: Secondary | ICD-10-CM | POA: Diagnosis not present

## 2020-01-01 DIAGNOSIS — I2111 ST elevation (STEMI) myocardial infarction involving right coronary artery: Secondary | ICD-10-CM | POA: Diagnosis not present

## 2020-01-01 DIAGNOSIS — N179 Acute kidney failure, unspecified: Secondary | ICD-10-CM | POA: Diagnosis not present

## 2020-01-01 DIAGNOSIS — I82409 Acute embolism and thrombosis of unspecified deep veins of unspecified lower extremity: Secondary | ICD-10-CM | POA: Diagnosis not present

## 2020-01-01 DIAGNOSIS — I5023 Acute on chronic systolic (congestive) heart failure: Secondary | ICD-10-CM | POA: Diagnosis not present

## 2020-01-01 DIAGNOSIS — K922 Gastrointestinal hemorrhage, unspecified: Secondary | ICD-10-CM | POA: Diagnosis not present

## 2020-01-01 DIAGNOSIS — F1011 Alcohol abuse, in remission: Secondary | ICD-10-CM | POA: Diagnosis not present

## 2020-01-01 DIAGNOSIS — I10 Essential (primary) hypertension: Secondary | ICD-10-CM | POA: Diagnosis not present

## 2020-01-01 DIAGNOSIS — Z7901 Long term (current) use of anticoagulants: Secondary | ICD-10-CM | POA: Diagnosis not present

## 2020-01-01 DIAGNOSIS — I4891 Unspecified atrial fibrillation: Secondary | ICD-10-CM | POA: Diagnosis not present

## 2020-01-01 DIAGNOSIS — R404 Transient alteration of awareness: Secondary | ICD-10-CM | POA: Diagnosis not present

## 2020-01-01 DIAGNOSIS — Z79899 Other long term (current) drug therapy: Secondary | ICD-10-CM | POA: Diagnosis not present

## 2020-01-01 DIAGNOSIS — J9601 Acute respiratory failure with hypoxia: Secondary | ICD-10-CM | POA: Diagnosis not present

## 2020-01-01 DIAGNOSIS — R1312 Dysphagia, oropharyngeal phase: Secondary | ICD-10-CM | POA: Diagnosis not present

## 2020-01-01 DIAGNOSIS — R2681 Unsteadiness on feet: Secondary | ICD-10-CM | POA: Diagnosis not present

## 2020-01-01 DIAGNOSIS — R6889 Other general symptoms and signs: Secondary | ICD-10-CM | POA: Diagnosis not present

## 2020-01-01 DIAGNOSIS — E46 Unspecified protein-calorie malnutrition: Secondary | ICD-10-CM | POA: Diagnosis not present

## 2020-01-01 DIAGNOSIS — F419 Anxiety disorder, unspecified: Secondary | ICD-10-CM | POA: Diagnosis not present

## 2020-01-01 DIAGNOSIS — L97919 Non-pressure chronic ulcer of unspecified part of right lower leg with unspecified severity: Secondary | ICD-10-CM | POA: Diagnosis not present

## 2020-01-01 DIAGNOSIS — M6281 Muscle weakness (generalized): Secondary | ICD-10-CM | POA: Diagnosis not present

## 2020-01-01 DIAGNOSIS — I071 Rheumatic tricuspid insufficiency: Secondary | ICD-10-CM | POA: Diagnosis not present

## 2020-01-01 DIAGNOSIS — I495 Sick sinus syndrome: Secondary | ICD-10-CM | POA: Diagnosis not present

## 2020-01-01 DIAGNOSIS — I447 Left bundle-branch block, unspecified: Secondary | ICD-10-CM | POA: Diagnosis not present

## 2020-01-01 DIAGNOSIS — I2510 Atherosclerotic heart disease of native coronary artery without angina pectoris: Secondary | ICD-10-CM | POA: Diagnosis not present

## 2020-01-01 DIAGNOSIS — R531 Weakness: Secondary | ICD-10-CM | POA: Diagnosis not present

## 2020-01-01 DIAGNOSIS — I499 Cardiac arrhythmia, unspecified: Secondary | ICD-10-CM | POA: Diagnosis not present

## 2020-01-01 DIAGNOSIS — R0902 Hypoxemia: Secondary | ICD-10-CM | POA: Diagnosis not present

## 2020-01-01 DIAGNOSIS — S72142A Displaced intertrochanteric fracture of left femur, initial encounter for closed fracture: Secondary | ICD-10-CM | POA: Diagnosis not present

## 2020-01-01 DIAGNOSIS — E1122 Type 2 diabetes mellitus with diabetic chronic kidney disease: Secondary | ICD-10-CM | POA: Diagnosis not present

## 2020-01-01 DIAGNOSIS — R4182 Altered mental status, unspecified: Secondary | ICD-10-CM | POA: Diagnosis not present

## 2020-01-01 DIAGNOSIS — I671 Cerebral aneurysm, nonruptured: Secondary | ICD-10-CM | POA: Diagnosis not present

## 2020-01-01 DIAGNOSIS — E039 Hypothyroidism, unspecified: Secondary | ICD-10-CM | POA: Diagnosis not present

## 2020-01-01 DIAGNOSIS — J449 Chronic obstructive pulmonary disease, unspecified: Secondary | ICD-10-CM | POA: Diagnosis not present

## 2020-01-01 DIAGNOSIS — I130 Hypertensive heart and chronic kidney disease with heart failure and stage 1 through stage 4 chronic kidney disease, or unspecified chronic kidney disease: Secondary | ICD-10-CM | POA: Diagnosis not present

## 2020-01-01 DIAGNOSIS — E1140 Type 2 diabetes mellitus with diabetic neuropathy, unspecified: Secondary | ICD-10-CM | POA: Diagnosis not present

## 2020-01-01 DIAGNOSIS — I50810 Right heart failure, unspecified: Secondary | ICD-10-CM | POA: Diagnosis not present

## 2020-01-01 DIAGNOSIS — D649 Anemia, unspecified: Secondary | ICD-10-CM | POA: Diagnosis not present

## 2020-01-01 DIAGNOSIS — F4323 Adjustment disorder with mixed anxiety and depressed mood: Secondary | ICD-10-CM | POA: Diagnosis not present

## 2020-01-01 DIAGNOSIS — Z7982 Long term (current) use of aspirin: Secondary | ICD-10-CM | POA: Diagnosis not present

## 2020-01-01 DIAGNOSIS — R197 Diarrhea, unspecified: Secondary | ICD-10-CM | POA: Diagnosis not present

## 2020-01-01 DIAGNOSIS — I714 Abdominal aortic aneurysm, without rupture: Secondary | ICD-10-CM | POA: Diagnosis not present

## 2020-01-01 DIAGNOSIS — F329 Major depressive disorder, single episode, unspecified: Secondary | ICD-10-CM | POA: Diagnosis not present

## 2020-01-01 DIAGNOSIS — D638 Anemia in other chronic diseases classified elsewhere: Secondary | ICD-10-CM | POA: Diagnosis not present

## 2020-01-01 DIAGNOSIS — N390 Urinary tract infection, site not specified: Secondary | ICD-10-CM | POA: Diagnosis not present

## 2020-01-01 DIAGNOSIS — M4626 Osteomyelitis of vertebra, lumbar region: Secondary | ICD-10-CM | POA: Diagnosis not present

## 2020-01-01 DIAGNOSIS — N186 End stage renal disease: Secondary | ICD-10-CM | POA: Diagnosis not present

## 2020-01-02 DIAGNOSIS — K922 Gastrointestinal hemorrhage, unspecified: Secondary | ICD-10-CM | POA: Diagnosis not present

## 2020-01-02 DIAGNOSIS — M1990 Unspecified osteoarthritis, unspecified site: Secondary | ICD-10-CM | POA: Diagnosis not present

## 2020-01-02 DIAGNOSIS — R58 Hemorrhage, not elsewhere classified: Secondary | ICD-10-CM | POA: Diagnosis not present

## 2020-01-02 DIAGNOSIS — Z20822 Contact with and (suspected) exposure to covid-19: Secondary | ICD-10-CM | POA: Diagnosis not present

## 2020-01-02 DIAGNOSIS — J9601 Acute respiratory failure with hypoxia: Secondary | ICD-10-CM | POA: Diagnosis not present

## 2020-01-02 DIAGNOSIS — G629 Polyneuropathy, unspecified: Secondary | ICD-10-CM | POA: Diagnosis not present

## 2020-01-02 DIAGNOSIS — D869 Sarcoidosis, unspecified: Secondary | ICD-10-CM | POA: Diagnosis not present

## 2020-01-02 DIAGNOSIS — R42 Dizziness and giddiness: Secondary | ICD-10-CM | POA: Diagnosis not present

## 2020-01-02 DIAGNOSIS — I82409 Acute embolism and thrombosis of unspecified deep veins of unspecified lower extremity: Secondary | ICD-10-CM | POA: Diagnosis not present

## 2020-01-02 DIAGNOSIS — E119 Type 2 diabetes mellitus without complications: Secondary | ICD-10-CM | POA: Diagnosis not present

## 2020-01-02 DIAGNOSIS — K529 Noninfective gastroenteritis and colitis, unspecified: Secondary | ICD-10-CM | POA: Diagnosis not present

## 2020-01-02 DIAGNOSIS — J939 Pneumothorax, unspecified: Secondary | ICD-10-CM | POA: Diagnosis not present

## 2020-01-02 DIAGNOSIS — I10 Essential (primary) hypertension: Secondary | ICD-10-CM | POA: Diagnosis not present

## 2020-01-02 DIAGNOSIS — I9589 Other hypotension: Secondary | ICD-10-CM | POA: Diagnosis not present

## 2020-01-02 DIAGNOSIS — Z20828 Contact with and (suspected) exposure to other viral communicable diseases: Secondary | ICD-10-CM | POA: Diagnosis not present

## 2020-01-02 DIAGNOSIS — G8190 Hemiplegia, unspecified affecting unspecified side: Secondary | ICD-10-CM | POA: Diagnosis not present

## 2020-01-02 DIAGNOSIS — R0902 Hypoxemia: Secondary | ICD-10-CM | POA: Diagnosis not present

## 2020-01-02 DIAGNOSIS — K219 Gastro-esophageal reflux disease without esophagitis: Secondary | ICD-10-CM | POA: Diagnosis not present

## 2020-01-02 DIAGNOSIS — I2510 Atherosclerotic heart disease of native coronary artery without angina pectoris: Secondary | ICD-10-CM | POA: Diagnosis not present

## 2020-01-02 DIAGNOSIS — M79603 Pain in arm, unspecified: Secondary | ICD-10-CM | POA: Diagnosis not present

## 2020-01-02 DIAGNOSIS — Z681 Body mass index (BMI) 19 or less, adult: Secondary | ICD-10-CM | POA: Diagnosis not present

## 2020-01-02 DIAGNOSIS — R2689 Other abnormalities of gait and mobility: Secondary | ICD-10-CM | POA: Diagnosis not present

## 2020-01-02 DIAGNOSIS — R442 Other hallucinations: Secondary | ICD-10-CM | POA: Diagnosis not present

## 2020-01-02 DIAGNOSIS — Z209 Contact with and (suspected) exposure to unspecified communicable disease: Secondary | ICD-10-CM | POA: Diagnosis not present

## 2020-01-02 DIAGNOSIS — R000 Tachycardia, unspecified: Secondary | ICD-10-CM | POA: Diagnosis not present

## 2020-01-02 DIAGNOSIS — R64 Cachexia: Secondary | ICD-10-CM | POA: Diagnosis not present

## 2020-01-02 DIAGNOSIS — E271 Primary adrenocortical insufficiency: Secondary | ICD-10-CM | POA: Diagnosis not present

## 2020-01-02 DIAGNOSIS — G4700 Insomnia, unspecified: Secondary | ICD-10-CM | POA: Diagnosis not present

## 2020-01-02 DIAGNOSIS — R52 Pain, unspecified: Secondary | ICD-10-CM | POA: Diagnosis not present

## 2020-01-02 DIAGNOSIS — E860 Dehydration: Secondary | ICD-10-CM | POA: Diagnosis not present

## 2020-01-02 DIAGNOSIS — G894 Chronic pain syndrome: Secondary | ICD-10-CM | POA: Diagnosis not present

## 2020-01-02 DIAGNOSIS — I50810 Right heart failure, unspecified: Secondary | ICD-10-CM | POA: Diagnosis not present

## 2020-01-02 DIAGNOSIS — E871 Hypo-osmolality and hyponatremia: Secondary | ICD-10-CM | POA: Diagnosis not present

## 2020-01-02 DIAGNOSIS — U071 COVID-19: Secondary | ICD-10-CM | POA: Diagnosis not present

## 2020-01-02 DIAGNOSIS — I4510 Unspecified right bundle-branch block: Secondary | ICD-10-CM | POA: Diagnosis not present

## 2020-01-02 DIAGNOSIS — E876 Hypokalemia: Secondary | ICD-10-CM | POA: Diagnosis not present

## 2020-01-03 DIAGNOSIS — Z0001 Encounter for general adult medical examination with abnormal findings: Secondary | ICD-10-CM | POA: Diagnosis not present

## 2020-01-03 DIAGNOSIS — N1832 Chronic kidney disease, stage 3b: Secondary | ICD-10-CM | POA: Diagnosis not present

## 2020-01-03 DIAGNOSIS — E0842 Diabetes mellitus due to underlying condition with diabetic polyneuropathy: Secondary | ICD-10-CM | POA: Diagnosis not present

## 2020-01-03 DIAGNOSIS — J939 Pneumothorax, unspecified: Secondary | ICD-10-CM | POA: Diagnosis not present

## 2020-01-03 DIAGNOSIS — Z9889 Other specified postprocedural states: Secondary | ICD-10-CM | POA: Diagnosis not present

## 2020-01-03 DIAGNOSIS — M9903 Segmental and somatic dysfunction of lumbar region: Secondary | ICD-10-CM | POA: Diagnosis not present

## 2020-01-03 DIAGNOSIS — E1142 Type 2 diabetes mellitus with diabetic polyneuropathy: Secondary | ICD-10-CM | POA: Diagnosis not present

## 2020-01-03 DIAGNOSIS — K317 Polyp of stomach and duodenum: Secondary | ICD-10-CM | POA: Diagnosis not present

## 2020-01-03 DIAGNOSIS — G8921 Chronic pain due to trauma: Secondary | ICD-10-CM | POA: Diagnosis not present

## 2020-01-03 DIAGNOSIS — Z79899 Other long term (current) drug therapy: Secondary | ICD-10-CM | POA: Diagnosis not present

## 2020-01-03 DIAGNOSIS — I340 Nonrheumatic mitral (valve) insufficiency: Secondary | ICD-10-CM | POA: Diagnosis not present

## 2020-01-03 DIAGNOSIS — N8110 Cystocele, unspecified: Secondary | ICD-10-CM | POA: Diagnosis not present

## 2020-01-03 DIAGNOSIS — R399 Unspecified symptoms and signs involving the genitourinary system: Secondary | ICD-10-CM | POA: Diagnosis not present

## 2020-01-03 DIAGNOSIS — E119 Type 2 diabetes mellitus without complications: Secondary | ICD-10-CM | POA: Diagnosis not present

## 2020-01-03 DIAGNOSIS — I429 Cardiomyopathy, unspecified: Secondary | ICD-10-CM | POA: Diagnosis not present

## 2020-01-03 DIAGNOSIS — J9601 Acute respiratory failure with hypoxia: Secondary | ICD-10-CM | POA: Diagnosis not present

## 2020-01-03 DIAGNOSIS — J1282 Pneumonia due to coronavirus disease 2019: Secondary | ICD-10-CM | POA: Diagnosis not present

## 2020-01-03 DIAGNOSIS — E782 Mixed hyperlipidemia: Secondary | ICD-10-CM | POA: Diagnosis not present

## 2020-01-03 DIAGNOSIS — H8112 Benign paroxysmal vertigo, left ear: Secondary | ICD-10-CM | POA: Diagnosis not present

## 2020-01-03 DIAGNOSIS — E1159 Type 2 diabetes mellitus with other circulatory complications: Secondary | ICD-10-CM | POA: Diagnosis not present

## 2020-01-03 DIAGNOSIS — M5134 Other intervertebral disc degeneration, thoracic region: Secondary | ICD-10-CM | POA: Diagnosis not present

## 2020-01-03 DIAGNOSIS — I10 Essential (primary) hypertension: Secondary | ICD-10-CM | POA: Diagnosis not present

## 2020-01-03 DIAGNOSIS — R404 Transient alteration of awareness: Secondary | ICD-10-CM | POA: Diagnosis not present

## 2020-01-03 DIAGNOSIS — Q675 Congenital deformity of spine: Secondary | ICD-10-CM | POA: Diagnosis not present

## 2020-01-03 DIAGNOSIS — R8290 Unspecified abnormal findings in urine: Secondary | ICD-10-CM | POA: Diagnosis not present

## 2020-01-03 DIAGNOSIS — F419 Anxiety disorder, unspecified: Secondary | ICD-10-CM | POA: Diagnosis not present

## 2020-01-03 DIAGNOSIS — E039 Hypothyroidism, unspecified: Secondary | ICD-10-CM | POA: Diagnosis not present

## 2020-01-03 DIAGNOSIS — I5042 Chronic combined systolic (congestive) and diastolic (congestive) heart failure: Secondary | ICD-10-CM | POA: Diagnosis not present

## 2020-01-03 DIAGNOSIS — I428 Other cardiomyopathies: Secondary | ICD-10-CM | POA: Diagnosis not present

## 2020-01-03 DIAGNOSIS — E1165 Type 2 diabetes mellitus with hyperglycemia: Secondary | ICD-10-CM | POA: Diagnosis not present

## 2020-01-03 DIAGNOSIS — E1169 Type 2 diabetes mellitus with other specified complication: Secondary | ICD-10-CM | POA: Diagnosis not present

## 2020-01-03 DIAGNOSIS — D649 Anemia, unspecified: Secondary | ICD-10-CM | POA: Diagnosis not present

## 2020-01-03 DIAGNOSIS — Z1389 Encounter for screening for other disorder: Secondary | ICD-10-CM | POA: Diagnosis not present

## 2020-01-03 DIAGNOSIS — E7800 Pure hypercholesterolemia, unspecified: Secondary | ICD-10-CM | POA: Diagnosis not present

## 2020-01-03 DIAGNOSIS — R32 Unspecified urinary incontinence: Secondary | ICD-10-CM | POA: Diagnosis not present

## 2020-01-03 DIAGNOSIS — G214 Vascular parkinsonism: Secondary | ICD-10-CM | POA: Diagnosis not present

## 2020-01-03 DIAGNOSIS — K0510 Chronic gingivitis, plaque induced: Secondary | ICD-10-CM | POA: Diagnosis not present

## 2020-01-03 DIAGNOSIS — Z23 Encounter for immunization: Secondary | ICD-10-CM | POA: Diagnosis not present

## 2020-01-03 DIAGNOSIS — F3341 Major depressive disorder, recurrent, in partial remission: Secondary | ICD-10-CM | POA: Diagnosis not present

## 2020-01-03 DIAGNOSIS — M7662 Achilles tendinitis, left leg: Secondary | ICD-10-CM | POA: Diagnosis not present

## 2020-01-03 DIAGNOSIS — Z716 Tobacco abuse counseling: Secondary | ICD-10-CM | POA: Diagnosis not present

## 2020-01-03 DIAGNOSIS — Z743 Need for continuous supervision: Secondary | ICD-10-CM | POA: Diagnosis not present

## 2020-01-03 DIAGNOSIS — Z6826 Body mass index (BMI) 26.0-26.9, adult: Secondary | ICD-10-CM | POA: Diagnosis not present

## 2020-01-03 DIAGNOSIS — R319 Hematuria, unspecified: Secondary | ICD-10-CM | POA: Diagnosis not present

## 2020-01-03 DIAGNOSIS — D509 Iron deficiency anemia, unspecified: Secondary | ICD-10-CM | POA: Diagnosis not present

## 2020-01-03 DIAGNOSIS — G9341 Metabolic encephalopathy: Secondary | ICD-10-CM | POA: Diagnosis not present

## 2020-01-03 DIAGNOSIS — E663 Overweight: Secondary | ICD-10-CM | POA: Diagnosis not present

## 2020-01-03 DIAGNOSIS — M9905 Segmental and somatic dysfunction of pelvic region: Secondary | ICD-10-CM | POA: Diagnosis not present

## 2020-01-03 DIAGNOSIS — Z125 Encounter for screening for malignant neoplasm of prostate: Secondary | ICD-10-CM | POA: Diagnosis not present

## 2020-01-03 DIAGNOSIS — Z7901 Long term (current) use of anticoagulants: Secondary | ICD-10-CM | POA: Diagnosis not present

## 2020-01-03 DIAGNOSIS — M720 Palmar fascial fibromatosis [Dupuytren]: Secondary | ICD-10-CM | POA: Diagnosis not present

## 2020-01-03 DIAGNOSIS — E669 Obesity, unspecified: Secondary | ICD-10-CM | POA: Diagnosis not present

## 2020-01-03 DIAGNOSIS — M1712 Unilateral primary osteoarthritis, left knee: Secondary | ICD-10-CM | POA: Diagnosis not present

## 2020-01-03 DIAGNOSIS — R972 Elevated prostate specific antigen [PSA]: Secondary | ICD-10-CM | POA: Diagnosis not present

## 2020-01-03 DIAGNOSIS — I420 Dilated cardiomyopathy: Secondary | ICD-10-CM | POA: Diagnosis not present

## 2020-01-03 DIAGNOSIS — K7290 Hepatic failure, unspecified without coma: Secondary | ICD-10-CM | POA: Diagnosis not present

## 2020-01-03 DIAGNOSIS — R531 Weakness: Secondary | ICD-10-CM | POA: Diagnosis not present

## 2020-01-03 DIAGNOSIS — T8453XS Infection and inflammatory reaction due to internal right knee prosthesis, sequela: Secondary | ICD-10-CM | POA: Diagnosis not present

## 2020-01-03 DIAGNOSIS — I4821 Permanent atrial fibrillation: Secondary | ICD-10-CM | POA: Diagnosis not present

## 2020-01-03 DIAGNOSIS — E1151 Type 2 diabetes mellitus with diabetic peripheral angiopathy without gangrene: Secondary | ICD-10-CM | POA: Diagnosis not present

## 2020-01-03 DIAGNOSIS — R5382 Chronic fatigue, unspecified: Secondary | ICD-10-CM | POA: Diagnosis not present

## 2020-01-03 DIAGNOSIS — J90 Pleural effusion, not elsewhere classified: Secondary | ICD-10-CM | POA: Diagnosis not present

## 2020-01-03 DIAGNOSIS — T8453XA Infection and inflammatory reaction due to internal right knee prosthesis, initial encounter: Secondary | ICD-10-CM | POA: Diagnosis not present

## 2020-01-03 DIAGNOSIS — M546 Pain in thoracic spine: Secondary | ICD-10-CM | POA: Diagnosis not present

## 2020-01-03 DIAGNOSIS — M955 Acquired deformity of pelvis: Secondary | ICD-10-CM | POA: Diagnosis not present

## 2020-01-03 DIAGNOSIS — J449 Chronic obstructive pulmonary disease, unspecified: Secondary | ICD-10-CM | POA: Diagnosis not present

## 2020-01-03 DIAGNOSIS — M5136 Other intervertebral disc degeneration, lumbar region: Secondary | ICD-10-CM | POA: Diagnosis not present

## 2020-01-03 DIAGNOSIS — S62101S Fracture of unspecified carpal bone, right wrist, sequela: Secondary | ICD-10-CM | POA: Diagnosis not present

## 2020-01-03 DIAGNOSIS — M47816 Spondylosis without myelopathy or radiculopathy, lumbar region: Secondary | ICD-10-CM | POA: Diagnosis not present

## 2020-01-03 DIAGNOSIS — H524 Presbyopia: Secondary | ICD-10-CM | POA: Diagnosis not present

## 2020-01-03 DIAGNOSIS — I2510 Atherosclerotic heart disease of native coronary artery without angina pectoris: Secondary | ICD-10-CM | POA: Diagnosis not present

## 2020-01-03 DIAGNOSIS — M170 Bilateral primary osteoarthritis of knee: Secondary | ICD-10-CM | POA: Diagnosis not present

## 2020-01-03 DIAGNOSIS — R0789 Other chest pain: Secondary | ICD-10-CM | POA: Diagnosis not present

## 2020-01-03 DIAGNOSIS — M79671 Pain in right foot: Secondary | ICD-10-CM | POA: Diagnosis not present

## 2020-01-03 DIAGNOSIS — M5412 Radiculopathy, cervical region: Secondary | ICD-10-CM | POA: Diagnosis not present

## 2020-01-03 DIAGNOSIS — F54 Psychological and behavioral factors associated with disorders or diseases classified elsewhere: Secondary | ICD-10-CM | POA: Diagnosis not present

## 2020-01-03 DIAGNOSIS — N390 Urinary tract infection, site not specified: Secondary | ICD-10-CM | POA: Diagnosis not present

## 2020-01-03 DIAGNOSIS — M5441 Lumbago with sciatica, right side: Secondary | ICD-10-CM | POA: Diagnosis not present

## 2020-01-03 DIAGNOSIS — Z8659 Personal history of other mental and behavioral disorders: Secondary | ICD-10-CM | POA: Diagnosis not present

## 2020-01-03 DIAGNOSIS — U071 COVID-19: Secondary | ICD-10-CM | POA: Diagnosis not present

## 2020-01-03 DIAGNOSIS — E113291 Type 2 diabetes mellitus with mild nonproliferative diabetic retinopathy without macular edema, right eye: Secondary | ICD-10-CM | POA: Diagnosis not present

## 2020-01-03 DIAGNOSIS — K219 Gastro-esophageal reflux disease without esophagitis: Secondary | ICD-10-CM | POA: Diagnosis not present

## 2020-01-03 DIAGNOSIS — I712 Thoracic aortic aneurysm, without rupture: Secondary | ICD-10-CM | POA: Diagnosis not present

## 2020-01-03 DIAGNOSIS — M47814 Spondylosis without myelopathy or radiculopathy, thoracic region: Secondary | ICD-10-CM | POA: Diagnosis not present

## 2020-01-03 DIAGNOSIS — R7301 Impaired fasting glucose: Secondary | ICD-10-CM | POA: Diagnosis not present

## 2020-01-03 DIAGNOSIS — Z0000 Encounter for general adult medical examination without abnormal findings: Secondary | ICD-10-CM | POA: Diagnosis not present

## 2020-01-03 DIAGNOSIS — R54 Age-related physical debility: Secondary | ICD-10-CM | POA: Diagnosis not present

## 2020-01-03 DIAGNOSIS — G894 Chronic pain syndrome: Secondary | ICD-10-CM | POA: Diagnosis not present

## 2020-01-03 DIAGNOSIS — I5043 Acute on chronic combined systolic (congestive) and diastolic (congestive) heart failure: Secondary | ICD-10-CM | POA: Diagnosis not present

## 2020-01-03 DIAGNOSIS — F325 Major depressive disorder, single episode, in full remission: Secondary | ICD-10-CM | POA: Diagnosis not present

## 2020-01-03 DIAGNOSIS — R739 Hyperglycemia, unspecified: Secondary | ICD-10-CM | POA: Diagnosis not present

## 2020-01-03 DIAGNOSIS — E1122 Type 2 diabetes mellitus with diabetic chronic kidney disease: Secondary | ICD-10-CM | POA: Diagnosis not present

## 2020-01-03 DIAGNOSIS — G300 Alzheimer's disease with early onset: Secondary | ICD-10-CM | POA: Diagnosis not present

## 2020-01-03 DIAGNOSIS — C61 Malignant neoplasm of prostate: Secondary | ICD-10-CM | POA: Diagnosis not present

## 2020-01-03 DIAGNOSIS — M47812 Spondylosis without myelopathy or radiculopathy, cervical region: Secondary | ICD-10-CM | POA: Diagnosis not present

## 2020-01-03 DIAGNOSIS — F411 Generalized anxiety disorder: Secondary | ICD-10-CM | POA: Diagnosis not present

## 2020-01-03 DIAGNOSIS — M48061 Spinal stenosis, lumbar region without neurogenic claudication: Secondary | ICD-10-CM | POA: Diagnosis not present

## 2020-01-03 DIAGNOSIS — N400 Enlarged prostate without lower urinary tract symptoms: Secondary | ICD-10-CM | POA: Diagnosis not present

## 2020-01-03 DIAGNOSIS — M9902 Segmental and somatic dysfunction of thoracic region: Secondary | ICD-10-CM | POA: Diagnosis not present

## 2020-01-03 DIAGNOSIS — M5416 Radiculopathy, lumbar region: Secondary | ICD-10-CM | POA: Diagnosis not present

## 2020-01-03 DIAGNOSIS — Z87891 Personal history of nicotine dependence: Secondary | ICD-10-CM | POA: Diagnosis not present

## 2020-01-03 DIAGNOSIS — N182 Chronic kidney disease, stage 2 (mild): Secondary | ICD-10-CM | POA: Diagnosis not present

## 2020-01-03 DIAGNOSIS — M5090 Cervical disc disorder, unspecified, unspecified cervical region: Secondary | ICD-10-CM | POA: Diagnosis not present

## 2020-01-03 DIAGNOSIS — Z79891 Long term (current) use of opiate analgesic: Secondary | ICD-10-CM | POA: Diagnosis not present

## 2020-01-03 DIAGNOSIS — I5021 Acute systolic (congestive) heart failure: Secondary | ICD-10-CM | POA: Diagnosis not present

## 2020-01-03 DIAGNOSIS — H353130 Nonexudative age-related macular degeneration, bilateral, stage unspecified: Secondary | ICD-10-CM | POA: Diagnosis not present

## 2020-01-03 DIAGNOSIS — Z794 Long term (current) use of insulin: Secondary | ICD-10-CM | POA: Diagnosis not present

## 2020-01-03 DIAGNOSIS — N179 Acute kidney failure, unspecified: Secondary | ICD-10-CM | POA: Diagnosis not present

## 2020-01-03 DIAGNOSIS — E785 Hyperlipidemia, unspecified: Secondary | ICD-10-CM | POA: Diagnosis not present

## 2020-01-03 DIAGNOSIS — I252 Old myocardial infarction: Secondary | ICD-10-CM | POA: Diagnosis not present

## 2020-01-03 DIAGNOSIS — I480 Paroxysmal atrial fibrillation: Secondary | ICD-10-CM | POA: Diagnosis not present

## 2020-01-03 DIAGNOSIS — Z1159 Encounter for screening for other viral diseases: Secondary | ICD-10-CM | POA: Diagnosis not present

## 2020-01-03 DIAGNOSIS — R0902 Hypoxemia: Secondary | ICD-10-CM | POA: Diagnosis not present

## 2020-01-03 DIAGNOSIS — Z20822 Contact with and (suspected) exposure to covid-19: Secondary | ICD-10-CM | POA: Diagnosis not present

## 2020-01-03 DIAGNOSIS — M1990 Unspecified osteoarthritis, unspecified site: Secondary | ICD-10-CM | POA: Diagnosis not present

## 2020-01-03 DIAGNOSIS — R279 Unspecified lack of coordination: Secondary | ICD-10-CM | POA: Diagnosis not present

## 2020-01-03 DIAGNOSIS — R0982 Postnasal drip: Secondary | ICD-10-CM | POA: Diagnosis not present

## 2020-01-03 DIAGNOSIS — C50412 Malignant neoplasm of upper-outer quadrant of left female breast: Secondary | ICD-10-CM | POA: Diagnosis not present

## 2020-01-03 DIAGNOSIS — Z5321 Procedure and treatment not carried out due to patient leaving prior to being seen by health care provider: Secondary | ICD-10-CM | POA: Diagnosis not present

## 2020-01-03 DIAGNOSIS — F17200 Nicotine dependence, unspecified, uncomplicated: Secondary | ICD-10-CM | POA: Diagnosis not present

## 2020-01-03 DIAGNOSIS — Z713 Dietary counseling and surveillance: Secondary | ICD-10-CM | POA: Diagnosis not present

## 2020-01-03 DIAGNOSIS — N184 Chronic kidney disease, stage 4 (severe): Secondary | ICD-10-CM | POA: Diagnosis not present

## 2020-01-03 DIAGNOSIS — E0590 Thyrotoxicosis, unspecified without thyrotoxic crisis or storm: Secondary | ICD-10-CM | POA: Diagnosis not present

## 2020-01-03 DIAGNOSIS — N183 Chronic kidney disease, stage 3 unspecified: Secondary | ICD-10-CM | POA: Diagnosis not present

## 2020-01-03 DIAGNOSIS — I130 Hypertensive heart and chronic kidney disease with heart failure and stage 1 through stage 4 chronic kidney disease, or unspecified chronic kidney disease: Secondary | ICD-10-CM | POA: Diagnosis not present

## 2020-01-03 DIAGNOSIS — Z720 Tobacco use: Secondary | ICD-10-CM | POA: Diagnosis not present

## 2020-01-03 DIAGNOSIS — F0151 Vascular dementia with behavioral disturbance: Secondary | ICD-10-CM | POA: Diagnosis not present

## 2020-01-03 DIAGNOSIS — M25511 Pain in right shoulder: Secondary | ICD-10-CM | POA: Diagnosis not present

## 2020-01-04 DIAGNOSIS — Z6841 Body Mass Index (BMI) 40.0 and over, adult: Secondary | ICD-10-CM | POA: Diagnosis not present

## 2020-01-04 DIAGNOSIS — R079 Chest pain, unspecified: Secondary | ICD-10-CM | POA: Diagnosis not present

## 2020-01-04 DIAGNOSIS — Z833 Family history of diabetes mellitus: Secondary | ICD-10-CM | POA: Diagnosis not present

## 2020-01-04 DIAGNOSIS — R52 Pain, unspecified: Secondary | ICD-10-CM | POA: Diagnosis not present

## 2020-01-04 DIAGNOSIS — Z1331 Encounter for screening for depression: Secondary | ICD-10-CM | POA: Diagnosis not present

## 2020-01-04 DIAGNOSIS — E039 Hypothyroidism, unspecified: Secondary | ICD-10-CM | POA: Diagnosis not present

## 2020-01-04 DIAGNOSIS — R110 Nausea: Secondary | ICD-10-CM | POA: Diagnosis not present

## 2020-01-04 DIAGNOSIS — Z20828 Contact with and (suspected) exposure to other viral communicable diseases: Secondary | ICD-10-CM | POA: Diagnosis not present

## 2020-01-04 DIAGNOSIS — M2578 Osteophyte, vertebrae: Secondary | ICD-10-CM | POA: Diagnosis not present

## 2020-01-04 DIAGNOSIS — E1151 Type 2 diabetes mellitus with diabetic peripheral angiopathy without gangrene: Secondary | ICD-10-CM | POA: Diagnosis not present

## 2020-01-04 DIAGNOSIS — M5116 Intervertebral disc disorders with radiculopathy, lumbar region: Secondary | ICD-10-CM | POA: Diagnosis not present

## 2020-01-04 DIAGNOSIS — F419 Anxiety disorder, unspecified: Secondary | ICD-10-CM | POA: Diagnosis not present

## 2020-01-04 DIAGNOSIS — Z7982 Long term (current) use of aspirin: Secondary | ICD-10-CM | POA: Diagnosis not present

## 2020-01-04 DIAGNOSIS — Z6832 Body mass index (BMI) 32.0-32.9, adult: Secondary | ICD-10-CM | POA: Diagnosis not present

## 2020-01-04 DIAGNOSIS — S0993XA Unspecified injury of face, initial encounter: Secondary | ICD-10-CM | POA: Diagnosis not present

## 2020-01-04 DIAGNOSIS — R2681 Unsteadiness on feet: Secondary | ICD-10-CM | POA: Diagnosis not present

## 2020-01-04 DIAGNOSIS — K219 Gastro-esophageal reflux disease without esophagitis: Secondary | ICD-10-CM | POA: Diagnosis not present

## 2020-01-04 DIAGNOSIS — I083 Combined rheumatic disorders of mitral, aortic and tricuspid valves: Secondary | ICD-10-CM | POA: Diagnosis not present

## 2020-01-04 DIAGNOSIS — I5022 Chronic systolic (congestive) heart failure: Secondary | ICD-10-CM | POA: Diagnosis not present

## 2020-01-04 DIAGNOSIS — Z1339 Encounter for screening examination for other mental health and behavioral disorders: Secondary | ICD-10-CM | POA: Diagnosis not present

## 2020-01-04 DIAGNOSIS — F325 Major depressive disorder, single episode, in full remission: Secondary | ICD-10-CM | POA: Diagnosis not present

## 2020-01-04 DIAGNOSIS — Z79891 Long term (current) use of opiate analgesic: Secondary | ICD-10-CM | POA: Diagnosis not present

## 2020-01-04 DIAGNOSIS — J189 Pneumonia, unspecified organism: Secondary | ICD-10-CM | POA: Diagnosis not present

## 2020-01-04 DIAGNOSIS — R3914 Feeling of incomplete bladder emptying: Secondary | ICD-10-CM | POA: Diagnosis not present

## 2020-01-04 DIAGNOSIS — M439 Deforming dorsopathy, unspecified: Secondary | ICD-10-CM | POA: Diagnosis not present

## 2020-01-04 DIAGNOSIS — C3491 Malignant neoplasm of unspecified part of right bronchus or lung: Secondary | ICD-10-CM | POA: Diagnosis not present

## 2020-01-04 DIAGNOSIS — R300 Dysuria: Secondary | ICD-10-CM | POA: Diagnosis not present

## 2020-01-04 DIAGNOSIS — D62 Acute posthemorrhagic anemia: Secondary | ICD-10-CM | POA: Diagnosis not present

## 2020-01-04 DIAGNOSIS — Z5321 Procedure and treatment not carried out due to patient leaving prior to being seen by health care provider: Secondary | ICD-10-CM | POA: Diagnosis not present

## 2020-01-04 DIAGNOSIS — I509 Heart failure, unspecified: Secondary | ICD-10-CM | POA: Diagnosis not present

## 2020-01-04 DIAGNOSIS — E119 Type 2 diabetes mellitus without complications: Secondary | ICD-10-CM | POA: Diagnosis not present

## 2020-01-04 DIAGNOSIS — R404 Transient alteration of awareness: Secondary | ICD-10-CM | POA: Diagnosis not present

## 2020-01-04 DIAGNOSIS — Z96653 Presence of artificial knee joint, bilateral: Secondary | ICD-10-CM | POA: Diagnosis not present

## 2020-01-04 DIAGNOSIS — D6869 Other thrombophilia: Secondary | ICD-10-CM | POA: Diagnosis not present

## 2020-01-04 DIAGNOSIS — I63511 Cerebral infarction due to unspecified occlusion or stenosis of right middle cerebral artery: Secondary | ICD-10-CM | POA: Diagnosis not present

## 2020-01-04 DIAGNOSIS — M4180 Other forms of scoliosis, site unspecified: Secondary | ICD-10-CM | POA: Diagnosis not present

## 2020-01-04 DIAGNOSIS — Z0001 Encounter for general adult medical examination with abnormal findings: Secondary | ICD-10-CM | POA: Diagnosis not present

## 2020-01-04 DIAGNOSIS — R471 Dysarthria and anarthria: Secondary | ICD-10-CM | POA: Diagnosis not present

## 2020-01-04 DIAGNOSIS — S0990XA Unspecified injury of head, initial encounter: Secondary | ICD-10-CM | POA: Diagnosis not present

## 2020-01-04 DIAGNOSIS — M542 Cervicalgia: Secondary | ICD-10-CM | POA: Diagnosis not present

## 2020-01-04 DIAGNOSIS — R509 Fever, unspecified: Secondary | ICD-10-CM | POA: Diagnosis not present

## 2020-01-04 DIAGNOSIS — M9905 Segmental and somatic dysfunction of pelvic region: Secondary | ICD-10-CM | POA: Diagnosis not present

## 2020-01-04 DIAGNOSIS — Z79899 Other long term (current) drug therapy: Secondary | ICD-10-CM | POA: Diagnosis not present

## 2020-01-04 DIAGNOSIS — G8194 Hemiplegia, unspecified affecting left nondominant side: Secondary | ICD-10-CM | POA: Diagnosis not present

## 2020-01-04 DIAGNOSIS — R040 Epistaxis: Secondary | ICD-10-CM | POA: Diagnosis not present

## 2020-01-04 DIAGNOSIS — E118 Type 2 diabetes mellitus with unspecified complications: Secondary | ICD-10-CM | POA: Diagnosis not present

## 2020-01-04 DIAGNOSIS — S90511A Abrasion, right ankle, initial encounter: Secondary | ICD-10-CM | POA: Diagnosis not present

## 2020-01-04 DIAGNOSIS — R296 Repeated falls: Secondary | ICD-10-CM | POA: Diagnosis not present

## 2020-01-04 DIAGNOSIS — N390 Urinary tract infection, site not specified: Secondary | ICD-10-CM | POA: Diagnosis not present

## 2020-01-04 DIAGNOSIS — M5136 Other intervertebral disc degeneration, lumbar region: Secondary | ICD-10-CM | POA: Diagnosis not present

## 2020-01-04 DIAGNOSIS — M5412 Radiculopathy, cervical region: Secondary | ICD-10-CM | POA: Diagnosis not present

## 2020-01-04 DIAGNOSIS — R0902 Hypoxemia: Secondary | ICD-10-CM | POA: Diagnosis not present

## 2020-01-04 DIAGNOSIS — Z86711 Personal history of pulmonary embolism: Secondary | ICD-10-CM | POA: Diagnosis not present

## 2020-01-04 DIAGNOSIS — N400 Enlarged prostate without lower urinary tract symptoms: Secondary | ICD-10-CM | POA: Diagnosis not present

## 2020-01-04 DIAGNOSIS — Z125 Encounter for screening for malignant neoplasm of prostate: Secondary | ICD-10-CM | POA: Diagnosis not present

## 2020-01-04 DIAGNOSIS — D696 Thrombocytopenia, unspecified: Secondary | ICD-10-CM | POA: Diagnosis not present

## 2020-01-04 DIAGNOSIS — N182 Chronic kidney disease, stage 2 (mild): Secondary | ICD-10-CM | POA: Diagnosis not present

## 2020-01-04 DIAGNOSIS — M1712 Unilateral primary osteoarthritis, left knee: Secondary | ICD-10-CM | POA: Diagnosis not present

## 2020-01-04 DIAGNOSIS — Z809 Family history of malignant neoplasm, unspecified: Secondary | ICD-10-CM | POA: Diagnosis not present

## 2020-01-04 DIAGNOSIS — Z87891 Personal history of nicotine dependence: Secondary | ICD-10-CM | POA: Diagnosis not present

## 2020-01-04 DIAGNOSIS — I2699 Other pulmonary embolism without acute cor pulmonale: Secondary | ICD-10-CM | POA: Diagnosis not present

## 2020-01-04 DIAGNOSIS — M25551 Pain in right hip: Secondary | ICD-10-CM | POA: Diagnosis not present

## 2020-01-04 DIAGNOSIS — R0602 Shortness of breath: Secondary | ICD-10-CM | POA: Diagnosis not present

## 2020-01-04 DIAGNOSIS — Z8776 Personal history of (corrected) congenital malformations of integument, limbs and musculoskeletal system: Secondary | ICD-10-CM | POA: Diagnosis not present

## 2020-01-04 DIAGNOSIS — G600 Hereditary motor and sensory neuropathy: Secondary | ICD-10-CM | POA: Diagnosis not present

## 2020-01-04 DIAGNOSIS — E871 Hypo-osmolality and hyponatremia: Secondary | ICD-10-CM | POA: Diagnosis not present

## 2020-01-04 DIAGNOSIS — J939 Pneumothorax, unspecified: Secondary | ICD-10-CM | POA: Diagnosis not present

## 2020-01-04 DIAGNOSIS — I340 Nonrheumatic mitral (valve) insufficiency: Secondary | ICD-10-CM | POA: Diagnosis not present

## 2020-01-04 DIAGNOSIS — I501 Left ventricular failure: Secondary | ICD-10-CM | POA: Diagnosis not present

## 2020-01-04 DIAGNOSIS — S8001XD Contusion of right knee, subsequent encounter: Secondary | ICD-10-CM | POA: Diagnosis not present

## 2020-01-04 DIAGNOSIS — I10 Essential (primary) hypertension: Secondary | ICD-10-CM | POA: Diagnosis not present

## 2020-01-04 DIAGNOSIS — Z794 Long term (current) use of insulin: Secondary | ICD-10-CM | POA: Diagnosis not present

## 2020-01-04 DIAGNOSIS — M79642 Pain in left hand: Secondary | ICD-10-CM | POA: Diagnosis not present

## 2020-01-04 DIAGNOSIS — H6123 Impacted cerumen, bilateral: Secondary | ICD-10-CM | POA: Diagnosis not present

## 2020-01-04 DIAGNOSIS — I472 Ventricular tachycardia: Secondary | ICD-10-CM | POA: Diagnosis not present

## 2020-01-04 DIAGNOSIS — I672 Cerebral atherosclerosis: Secondary | ICD-10-CM | POA: Diagnosis not present

## 2020-01-04 DIAGNOSIS — M6281 Muscle weakness (generalized): Secondary | ICD-10-CM | POA: Diagnosis not present

## 2020-01-04 DIAGNOSIS — I4821 Permanent atrial fibrillation: Secondary | ICD-10-CM | POA: Diagnosis not present

## 2020-01-04 DIAGNOSIS — I4811 Longstanding persistent atrial fibrillation: Secondary | ICD-10-CM | POA: Diagnosis not present

## 2020-01-04 DIAGNOSIS — M1711 Unilateral primary osteoarthritis, right knee: Secondary | ICD-10-CM | POA: Diagnosis not present

## 2020-01-04 DIAGNOSIS — M4316 Spondylolisthesis, lumbar region: Secondary | ICD-10-CM | POA: Diagnosis not present

## 2020-01-04 DIAGNOSIS — G4733 Obstructive sleep apnea (adult) (pediatric): Secondary | ICD-10-CM | POA: Diagnosis not present

## 2020-01-04 DIAGNOSIS — K5010 Crohn's disease of large intestine without complications: Secondary | ICD-10-CM | POA: Diagnosis not present

## 2020-01-04 DIAGNOSIS — M9904 Segmental and somatic dysfunction of sacral region: Secondary | ICD-10-CM | POA: Diagnosis not present

## 2020-01-04 DIAGNOSIS — K5790 Diverticulosis of intestine, part unspecified, without perforation or abscess without bleeding: Secondary | ICD-10-CM | POA: Diagnosis not present

## 2020-01-04 DIAGNOSIS — M545 Low back pain: Secondary | ICD-10-CM | POA: Diagnosis not present

## 2020-01-04 DIAGNOSIS — Z20822 Contact with and (suspected) exposure to covid-19: Secondary | ICD-10-CM | POA: Diagnosis not present

## 2020-01-04 DIAGNOSIS — I5030 Unspecified diastolic (congestive) heart failure: Secondary | ICD-10-CM | POA: Diagnosis not present

## 2020-01-04 DIAGNOSIS — C3492 Malignant neoplasm of unspecified part of left bronchus or lung: Secondary | ICD-10-CM | POA: Diagnosis not present

## 2020-01-04 DIAGNOSIS — M797 Fibromyalgia: Secondary | ICD-10-CM | POA: Diagnosis not present

## 2020-01-04 DIAGNOSIS — C61 Malignant neoplasm of prostate: Secondary | ICD-10-CM | POA: Diagnosis not present

## 2020-01-04 DIAGNOSIS — M5417 Radiculopathy, lumbosacral region: Secondary | ICD-10-CM | POA: Diagnosis not present

## 2020-01-04 DIAGNOSIS — Z96643 Presence of artificial hip joint, bilateral: Secondary | ICD-10-CM | POA: Diagnosis not present

## 2020-01-04 DIAGNOSIS — M76821 Posterior tibial tendinitis, right leg: Secondary | ICD-10-CM | POA: Diagnosis not present

## 2020-01-04 DIAGNOSIS — E7800 Pure hypercholesterolemia, unspecified: Secondary | ICD-10-CM | POA: Diagnosis not present

## 2020-01-04 DIAGNOSIS — I5021 Acute systolic (congestive) heart failure: Secondary | ICD-10-CM | POA: Diagnosis not present

## 2020-01-04 DIAGNOSIS — M47816 Spondylosis without myelopathy or radiculopathy, lumbar region: Secondary | ICD-10-CM | POA: Diagnosis not present

## 2020-01-04 DIAGNOSIS — M549 Dorsalgia, unspecified: Secondary | ICD-10-CM | POA: Diagnosis not present

## 2020-01-04 DIAGNOSIS — F988 Other specified behavioral and emotional disorders with onset usually occurring in childhood and adolescence: Secondary | ICD-10-CM | POA: Diagnosis not present

## 2020-01-04 DIAGNOSIS — I5042 Chronic combined systolic (congestive) and diastolic (congestive) heart failure: Secondary | ICD-10-CM | POA: Diagnosis not present

## 2020-01-04 DIAGNOSIS — G8929 Other chronic pain: Secondary | ICD-10-CM | POA: Diagnosis not present

## 2020-01-04 DIAGNOSIS — J9601 Acute respiratory failure with hypoxia: Secondary | ICD-10-CM | POA: Diagnosis not present

## 2020-01-04 DIAGNOSIS — E1122 Type 2 diabetes mellitus with diabetic chronic kidney disease: Secondary | ICD-10-CM | POA: Diagnosis not present

## 2020-01-04 DIAGNOSIS — I129 Hypertensive chronic kidney disease with stage 1 through stage 4 chronic kidney disease, or unspecified chronic kidney disease: Secondary | ICD-10-CM | POA: Diagnosis not present

## 2020-01-04 DIAGNOSIS — N183 Chronic kidney disease, stage 3 unspecified: Secondary | ICD-10-CM | POA: Diagnosis not present

## 2020-01-04 DIAGNOSIS — E860 Dehydration: Secondary | ICD-10-CM | POA: Diagnosis not present

## 2020-01-04 DIAGNOSIS — M4186 Other forms of scoliosis, lumbar region: Secondary | ICD-10-CM | POA: Diagnosis not present

## 2020-01-04 DIAGNOSIS — R29704 NIHSS score 4: Secondary | ICD-10-CM | POA: Diagnosis not present

## 2020-01-04 DIAGNOSIS — Z9181 History of falling: Secondary | ICD-10-CM | POA: Diagnosis not present

## 2020-01-04 DIAGNOSIS — Z8616 Personal history of COVID-19: Secondary | ICD-10-CM | POA: Diagnosis not present

## 2020-01-04 DIAGNOSIS — Z96651 Presence of right artificial knee joint: Secondary | ICD-10-CM | POA: Diagnosis not present

## 2020-01-04 DIAGNOSIS — M9903 Segmental and somatic dysfunction of lumbar region: Secondary | ICD-10-CM | POA: Diagnosis not present

## 2020-01-04 DIAGNOSIS — M9901 Segmental and somatic dysfunction of cervical region: Secondary | ICD-10-CM | POA: Diagnosis not present

## 2020-01-04 DIAGNOSIS — E1165 Type 2 diabetes mellitus with hyperglycemia: Secondary | ICD-10-CM | POA: Diagnosis not present

## 2020-01-04 DIAGNOSIS — E785 Hyperlipidemia, unspecified: Secondary | ICD-10-CM | POA: Diagnosis not present

## 2020-01-04 DIAGNOSIS — S59911A Unspecified injury of right forearm, initial encounter: Secondary | ICD-10-CM | POA: Diagnosis not present

## 2020-01-04 DIAGNOSIS — F17210 Nicotine dependence, cigarettes, uncomplicated: Secondary | ICD-10-CM | POA: Diagnosis not present

## 2020-01-04 DIAGNOSIS — R5383 Other fatigue: Secondary | ICD-10-CM | POA: Diagnosis not present

## 2020-01-04 DIAGNOSIS — M25561 Pain in right knee: Secondary | ICD-10-CM | POA: Diagnosis not present

## 2020-01-04 DIAGNOSIS — M531 Cervicobrachial syndrome: Secondary | ICD-10-CM | POA: Diagnosis not present

## 2020-01-04 DIAGNOSIS — C50912 Malignant neoplasm of unspecified site of left female breast: Secondary | ICD-10-CM | POA: Diagnosis not present

## 2020-01-04 DIAGNOSIS — I9589 Other hypotension: Secondary | ICD-10-CM | POA: Diagnosis not present

## 2020-01-04 DIAGNOSIS — N8111 Cystocele, midline: Secondary | ICD-10-CM | POA: Diagnosis not present

## 2020-01-04 DIAGNOSIS — R1013 Epigastric pain: Secondary | ICD-10-CM | POA: Diagnosis not present

## 2020-01-04 DIAGNOSIS — M5032 Other cervical disc degeneration, mid-cervical region, unspecified level: Secondary | ICD-10-CM | POA: Diagnosis not present

## 2020-01-04 DIAGNOSIS — R05 Cough: Secondary | ICD-10-CM | POA: Diagnosis not present

## 2020-01-04 DIAGNOSIS — E782 Mixed hyperlipidemia: Secondary | ICD-10-CM | POA: Diagnosis not present

## 2020-01-04 DIAGNOSIS — R262 Difficulty in walking, not elsewhere classified: Secondary | ICD-10-CM | POA: Diagnosis not present

## 2020-01-04 DIAGNOSIS — E1169 Type 2 diabetes mellitus with other specified complication: Secondary | ICD-10-CM | POA: Diagnosis not present

## 2020-01-04 DIAGNOSIS — I420 Dilated cardiomyopathy: Secondary | ICD-10-CM | POA: Diagnosis not present

## 2020-01-04 DIAGNOSIS — Z713 Dietary counseling and surveillance: Secondary | ICD-10-CM | POA: Diagnosis not present

## 2020-01-04 DIAGNOSIS — J449 Chronic obstructive pulmonary disease, unspecified: Secondary | ICD-10-CM | POA: Diagnosis not present

## 2020-01-04 DIAGNOSIS — U071 COVID-19: Secondary | ICD-10-CM | POA: Diagnosis not present

## 2020-01-04 DIAGNOSIS — J22 Unspecified acute lower respiratory infection: Secondary | ICD-10-CM | POA: Diagnosis not present

## 2020-01-04 DIAGNOSIS — I480 Paroxysmal atrial fibrillation: Secondary | ICD-10-CM | POA: Diagnosis not present

## 2020-01-04 DIAGNOSIS — M79669 Pain in unspecified lower leg: Secondary | ICD-10-CM | POA: Diagnosis not present

## 2020-01-04 DIAGNOSIS — R000 Tachycardia, unspecified: Secondary | ICD-10-CM | POA: Diagnosis not present

## 2020-01-04 DIAGNOSIS — J80 Acute respiratory distress syndrome: Secondary | ICD-10-CM | POA: Diagnosis not present

## 2020-01-04 DIAGNOSIS — F329 Major depressive disorder, single episode, unspecified: Secondary | ICD-10-CM | POA: Diagnosis not present

## 2020-01-04 DIAGNOSIS — M955 Acquired deformity of pelvis: Secondary | ICD-10-CM | POA: Diagnosis not present

## 2020-01-04 DIAGNOSIS — Z87440 Personal history of urinary (tract) infections: Secondary | ICD-10-CM | POA: Diagnosis not present

## 2020-01-04 DIAGNOSIS — T50Z95A Adverse effect of other vaccines and biological substances, initial encounter: Secondary | ICD-10-CM | POA: Diagnosis not present

## 2020-01-04 DIAGNOSIS — S79911A Unspecified injury of right hip, initial encounter: Secondary | ICD-10-CM | POA: Diagnosis not present

## 2020-01-04 DIAGNOSIS — M25562 Pain in left knee: Secondary | ICD-10-CM | POA: Diagnosis not present

## 2020-01-04 DIAGNOSIS — Z89422 Acquired absence of other left toe(s): Secondary | ICD-10-CM | POA: Diagnosis not present

## 2020-01-04 DIAGNOSIS — R58 Hemorrhage, not elsewhere classified: Secondary | ICD-10-CM | POA: Diagnosis not present

## 2020-01-04 DIAGNOSIS — E1142 Type 2 diabetes mellitus with diabetic polyneuropathy: Secondary | ICD-10-CM | POA: Diagnosis not present

## 2020-01-04 DIAGNOSIS — Z466 Encounter for fitting and adjustment of urinary device: Secondary | ICD-10-CM | POA: Diagnosis not present

## 2020-01-04 DIAGNOSIS — Z299 Encounter for prophylactic measures, unspecified: Secondary | ICD-10-CM | POA: Diagnosis not present

## 2020-01-04 DIAGNOSIS — A6920 Lyme disease, unspecified: Secondary | ICD-10-CM | POA: Diagnosis not present

## 2020-01-04 DIAGNOSIS — R339 Retention of urine, unspecified: Secondary | ICD-10-CM | POA: Diagnosis not present

## 2020-01-04 DIAGNOSIS — N181 Chronic kidney disease, stage 1: Secondary | ICD-10-CM | POA: Diagnosis not present

## 2020-01-04 DIAGNOSIS — Z951 Presence of aortocoronary bypass graft: Secondary | ICD-10-CM | POA: Diagnosis not present

## 2020-01-04 DIAGNOSIS — R6883 Chills (without fever): Secondary | ICD-10-CM | POA: Diagnosis not present

## 2020-01-04 DIAGNOSIS — I2510 Atherosclerotic heart disease of native coronary artery without angina pectoris: Secondary | ICD-10-CM | POA: Diagnosis not present

## 2020-01-04 DIAGNOSIS — R0789 Other chest pain: Secondary | ICD-10-CM | POA: Diagnosis not present

## 2020-01-04 DIAGNOSIS — G20 Parkinson's disease: Secondary | ICD-10-CM | POA: Diagnosis not present

## 2020-01-04 DIAGNOSIS — M5442 Lumbago with sciatica, left side: Secondary | ICD-10-CM | POA: Diagnosis not present

## 2020-01-04 DIAGNOSIS — E559 Vitamin D deficiency, unspecified: Secondary | ICD-10-CM | POA: Diagnosis not present

## 2020-01-05 DIAGNOSIS — Z853 Personal history of malignant neoplasm of breast: Secondary | ICD-10-CM | POA: Diagnosis not present

## 2020-01-05 DIAGNOSIS — Z961 Presence of intraocular lens: Secondary | ICD-10-CM | POA: Diagnosis not present

## 2020-01-05 DIAGNOSIS — R251 Tremor, unspecified: Secondary | ICD-10-CM | POA: Diagnosis not present

## 2020-01-05 DIAGNOSIS — R0602 Shortness of breath: Secondary | ICD-10-CM | POA: Diagnosis not present

## 2020-01-05 DIAGNOSIS — E1140 Type 2 diabetes mellitus with diabetic neuropathy, unspecified: Secondary | ICD-10-CM | POA: Diagnosis not present

## 2020-01-05 DIAGNOSIS — H401112 Primary open-angle glaucoma, right eye, moderate stage: Secondary | ICD-10-CM | POA: Diagnosis not present

## 2020-01-05 DIAGNOSIS — R52 Pain, unspecified: Secondary | ICD-10-CM | POA: Diagnosis not present

## 2020-01-05 DIAGNOSIS — C61 Malignant neoplasm of prostate: Secondary | ICD-10-CM | POA: Diagnosis not present

## 2020-01-05 DIAGNOSIS — E782 Mixed hyperlipidemia: Secondary | ICD-10-CM | POA: Diagnosis not present

## 2020-01-05 DIAGNOSIS — M2550 Pain in unspecified joint: Secondary | ICD-10-CM | POA: Diagnosis not present

## 2020-01-05 DIAGNOSIS — M955 Acquired deformity of pelvis: Secondary | ICD-10-CM | POA: Diagnosis not present

## 2020-01-05 DIAGNOSIS — H40051 Ocular hypertension, right eye: Secondary | ICD-10-CM | POA: Diagnosis not present

## 2020-01-05 DIAGNOSIS — N12 Tubulo-interstitial nephritis, not specified as acute or chronic: Secondary | ICD-10-CM | POA: Diagnosis not present

## 2020-01-05 DIAGNOSIS — D6832 Hemorrhagic disorder due to extrinsic circulating anticoagulants: Secondary | ICD-10-CM | POA: Diagnosis not present

## 2020-01-05 DIAGNOSIS — M7521 Bicipital tendinitis, right shoulder: Secondary | ICD-10-CM | POA: Diagnosis not present

## 2020-01-05 DIAGNOSIS — Z7982 Long term (current) use of aspirin: Secondary | ICD-10-CM | POA: Diagnosis not present

## 2020-01-05 DIAGNOSIS — N182 Chronic kidney disease, stage 2 (mild): Secondary | ICD-10-CM | POA: Diagnosis not present

## 2020-01-05 DIAGNOSIS — N1832 Chronic kidney disease, stage 3b: Secondary | ICD-10-CM | POA: Diagnosis not present

## 2020-01-05 DIAGNOSIS — L929 Granulomatous disorder of the skin and subcutaneous tissue, unspecified: Secondary | ICD-10-CM | POA: Diagnosis not present

## 2020-01-05 DIAGNOSIS — E1122 Type 2 diabetes mellitus with diabetic chronic kidney disease: Secondary | ICD-10-CM | POA: Diagnosis not present

## 2020-01-05 DIAGNOSIS — R339 Retention of urine, unspecified: Secondary | ICD-10-CM | POA: Diagnosis not present

## 2020-01-05 DIAGNOSIS — M19039 Primary osteoarthritis, unspecified wrist: Secondary | ICD-10-CM | POA: Diagnosis not present

## 2020-01-05 DIAGNOSIS — J939 Pneumothorax, unspecified: Secondary | ICD-10-CM | POA: Diagnosis not present

## 2020-01-05 DIAGNOSIS — Z170 Estrogen receptor positive status [ER+]: Secondary | ICD-10-CM | POA: Diagnosis not present

## 2020-01-05 DIAGNOSIS — R140 Abdominal distension (gaseous): Secondary | ICD-10-CM | POA: Diagnosis not present

## 2020-01-05 DIAGNOSIS — M810 Age-related osteoporosis without current pathological fracture: Secondary | ICD-10-CM | POA: Diagnosis not present

## 2020-01-05 DIAGNOSIS — N179 Acute kidney failure, unspecified: Secondary | ICD-10-CM | POA: Diagnosis not present

## 2020-01-05 DIAGNOSIS — J449 Chronic obstructive pulmonary disease, unspecified: Secondary | ICD-10-CM | POA: Diagnosis not present

## 2020-01-05 DIAGNOSIS — C189 Malignant neoplasm of colon, unspecified: Secondary | ICD-10-CM | POA: Diagnosis not present

## 2020-01-05 DIAGNOSIS — M7581 Other shoulder lesions, right shoulder: Secondary | ICD-10-CM | POA: Diagnosis not present

## 2020-01-05 DIAGNOSIS — Q675 Congenital deformity of spine: Secondary | ICD-10-CM | POA: Diagnosis not present

## 2020-01-05 DIAGNOSIS — G9341 Metabolic encephalopathy: Secondary | ICD-10-CM | POA: Diagnosis not present

## 2020-01-05 DIAGNOSIS — M5441 Lumbago with sciatica, right side: Secondary | ICD-10-CM | POA: Diagnosis not present

## 2020-01-05 DIAGNOSIS — M9903 Segmental and somatic dysfunction of lumbar region: Secondary | ICD-10-CM | POA: Diagnosis not present

## 2020-01-05 DIAGNOSIS — M25511 Pain in right shoulder: Secondary | ICD-10-CM | POA: Diagnosis not present

## 2020-01-05 DIAGNOSIS — G459 Transient cerebral ischemic attack, unspecified: Secondary | ICD-10-CM | POA: Diagnosis not present

## 2020-01-05 DIAGNOSIS — M546 Pain in thoracic spine: Secondary | ICD-10-CM | POA: Diagnosis not present

## 2020-01-05 DIAGNOSIS — I5021 Acute systolic (congestive) heart failure: Secondary | ICD-10-CM | POA: Diagnosis not present

## 2020-01-05 DIAGNOSIS — Z87891 Personal history of nicotine dependence: Secondary | ICD-10-CM | POA: Diagnosis not present

## 2020-01-05 DIAGNOSIS — F319 Bipolar disorder, unspecified: Secondary | ICD-10-CM | POA: Diagnosis not present

## 2020-01-05 DIAGNOSIS — K5900 Constipation, unspecified: Secondary | ICD-10-CM | POA: Diagnosis not present

## 2020-01-05 DIAGNOSIS — K921 Melena: Secondary | ICD-10-CM | POA: Diagnosis not present

## 2020-01-05 DIAGNOSIS — I129 Hypertensive chronic kidney disease with stage 1 through stage 4 chronic kidney disease, or unspecified chronic kidney disease: Secondary | ICD-10-CM | POA: Diagnosis not present

## 2020-01-05 DIAGNOSIS — Z820 Family history of epilepsy and other diseases of the nervous system: Secondary | ICD-10-CM | POA: Diagnosis not present

## 2020-01-05 DIAGNOSIS — I38 Endocarditis, valve unspecified: Secondary | ICD-10-CM | POA: Diagnosis not present

## 2020-01-05 DIAGNOSIS — Z8673 Personal history of transient ischemic attack (TIA), and cerebral infarction without residual deficits: Secondary | ICD-10-CM | POA: Diagnosis not present

## 2020-01-05 DIAGNOSIS — Z01419 Encounter for gynecological examination (general) (routine) without abnormal findings: Secondary | ICD-10-CM | POA: Diagnosis not present

## 2020-01-05 DIAGNOSIS — T45515A Adverse effect of anticoagulants, initial encounter: Secondary | ICD-10-CM | POA: Diagnosis not present

## 2020-01-05 DIAGNOSIS — Z79891 Long term (current) use of opiate analgesic: Secondary | ICD-10-CM | POA: Diagnosis not present

## 2020-01-05 DIAGNOSIS — R030 Elevated blood-pressure reading, without diagnosis of hypertension: Secondary | ICD-10-CM | POA: Diagnosis not present

## 2020-01-05 DIAGNOSIS — E7849 Other hyperlipidemia: Secondary | ICD-10-CM | POA: Diagnosis not present

## 2020-01-05 DIAGNOSIS — J9601 Acute respiratory failure with hypoxia: Secondary | ICD-10-CM | POA: Diagnosis not present

## 2020-01-05 DIAGNOSIS — G8190 Hemiplegia, unspecified affecting unspecified side: Secondary | ICD-10-CM | POA: Diagnosis not present

## 2020-01-05 DIAGNOSIS — F17200 Nicotine dependence, unspecified, uncomplicated: Secondary | ICD-10-CM | POA: Diagnosis not present

## 2020-01-05 DIAGNOSIS — I5032 Chronic diastolic (congestive) heart failure: Secondary | ICD-10-CM | POA: Diagnosis not present

## 2020-01-05 DIAGNOSIS — Z9884 Bariatric surgery status: Secondary | ICD-10-CM | POA: Diagnosis not present

## 2020-01-05 DIAGNOSIS — I83018 Varicose veins of right lower extremity with ulcer other part of lower leg: Secondary | ICD-10-CM | POA: Diagnosis not present

## 2020-01-05 DIAGNOSIS — E139 Other specified diabetes mellitus without complications: Secondary | ICD-10-CM | POA: Diagnosis not present

## 2020-01-05 DIAGNOSIS — I493 Ventricular premature depolarization: Secondary | ICD-10-CM | POA: Diagnosis not present

## 2020-01-05 DIAGNOSIS — I340 Nonrheumatic mitral (valve) insufficiency: Secondary | ICD-10-CM | POA: Diagnosis not present

## 2020-01-05 DIAGNOSIS — I25810 Atherosclerosis of coronary artery bypass graft(s) without angina pectoris: Secondary | ICD-10-CM | POA: Diagnosis not present

## 2020-01-05 DIAGNOSIS — I959 Hypotension, unspecified: Secondary | ICD-10-CM | POA: Diagnosis not present

## 2020-01-05 DIAGNOSIS — R58 Hemorrhage, not elsewhere classified: Secondary | ICD-10-CM | POA: Diagnosis not present

## 2020-01-05 DIAGNOSIS — R1012 Left upper quadrant pain: Secondary | ICD-10-CM | POA: Diagnosis not present

## 2020-01-05 DIAGNOSIS — I2510 Atherosclerotic heart disease of native coronary artery without angina pectoris: Secondary | ICD-10-CM | POA: Diagnosis not present

## 2020-01-05 DIAGNOSIS — R6889 Other general symptoms and signs: Secondary | ICD-10-CM | POA: Diagnosis not present

## 2020-01-05 DIAGNOSIS — Z79899 Other long term (current) drug therapy: Secondary | ICD-10-CM | POA: Diagnosis not present

## 2020-01-05 DIAGNOSIS — E119 Type 2 diabetes mellitus without complications: Secondary | ICD-10-CM | POA: Diagnosis not present

## 2020-01-05 DIAGNOSIS — Z66 Do not resuscitate: Secondary | ICD-10-CM | POA: Diagnosis not present

## 2020-01-05 DIAGNOSIS — I639 Cerebral infarction, unspecified: Secondary | ICD-10-CM | POA: Diagnosis not present

## 2020-01-05 DIAGNOSIS — E559 Vitamin D deficiency, unspecified: Secondary | ICD-10-CM | POA: Diagnosis not present

## 2020-01-05 DIAGNOSIS — R7301 Impaired fasting glucose: Secondary | ICD-10-CM | POA: Diagnosis not present

## 2020-01-05 DIAGNOSIS — K219 Gastro-esophageal reflux disease without esophagitis: Secondary | ICD-10-CM | POA: Diagnosis not present

## 2020-01-05 DIAGNOSIS — E039 Hypothyroidism, unspecified: Secondary | ICD-10-CM | POA: Diagnosis not present

## 2020-01-05 DIAGNOSIS — R944 Abnormal results of kidney function studies: Secondary | ICD-10-CM | POA: Diagnosis not present

## 2020-01-05 DIAGNOSIS — D62 Acute posthemorrhagic anemia: Secondary | ICD-10-CM | POA: Diagnosis not present

## 2020-01-05 DIAGNOSIS — M75112 Incomplete rotator cuff tear or rupture of left shoulder, not specified as traumatic: Secondary | ICD-10-CM | POA: Diagnosis not present

## 2020-01-05 DIAGNOSIS — H2511 Age-related nuclear cataract, right eye: Secondary | ICD-10-CM | POA: Diagnosis not present

## 2020-01-05 DIAGNOSIS — E7800 Pure hypercholesterolemia, unspecified: Secondary | ICD-10-CM | POA: Diagnosis not present

## 2020-01-05 DIAGNOSIS — R1013 Epigastric pain: Secondary | ICD-10-CM | POA: Diagnosis not present

## 2020-01-05 DIAGNOSIS — R1084 Generalized abdominal pain: Secondary | ICD-10-CM | POA: Diagnosis not present

## 2020-01-05 DIAGNOSIS — M9902 Segmental and somatic dysfunction of thoracic region: Secondary | ICD-10-CM | POA: Diagnosis not present

## 2020-01-05 DIAGNOSIS — I4821 Permanent atrial fibrillation: Secondary | ICD-10-CM | POA: Diagnosis not present

## 2020-01-05 DIAGNOSIS — G894 Chronic pain syndrome: Secondary | ICD-10-CM | POA: Diagnosis not present

## 2020-01-05 DIAGNOSIS — R350 Frequency of micturition: Secondary | ICD-10-CM | POA: Diagnosis not present

## 2020-01-05 DIAGNOSIS — F329 Major depressive disorder, single episode, unspecified: Secondary | ICD-10-CM | POA: Diagnosis not present

## 2020-01-05 DIAGNOSIS — E785 Hyperlipidemia, unspecified: Secondary | ICD-10-CM | POA: Diagnosis not present

## 2020-01-05 DIAGNOSIS — Z23 Encounter for immunization: Secondary | ICD-10-CM | POA: Diagnosis not present

## 2020-01-05 DIAGNOSIS — I509 Heart failure, unspecified: Secondary | ICD-10-CM | POA: Diagnosis not present

## 2020-01-05 DIAGNOSIS — Z7901 Long term (current) use of anticoagulants: Secondary | ICD-10-CM | POA: Diagnosis not present

## 2020-01-05 DIAGNOSIS — Z7902 Long term (current) use of antithrombotics/antiplatelets: Secondary | ICD-10-CM | POA: Diagnosis not present

## 2020-01-05 DIAGNOSIS — R112 Nausea with vomiting, unspecified: Secondary | ICD-10-CM | POA: Diagnosis not present

## 2020-01-05 DIAGNOSIS — M9905 Segmental and somatic dysfunction of pelvic region: Secondary | ICD-10-CM | POA: Diagnosis not present

## 2020-01-05 DIAGNOSIS — R000 Tachycardia, unspecified: Secondary | ICD-10-CM | POA: Diagnosis not present

## 2020-01-05 DIAGNOSIS — U071 COVID-19: Secondary | ICD-10-CM | POA: Diagnosis not present

## 2020-01-05 DIAGNOSIS — I420 Dilated cardiomyopathy: Secondary | ICD-10-CM | POA: Diagnosis not present

## 2020-01-05 DIAGNOSIS — Z794 Long term (current) use of insulin: Secondary | ICD-10-CM | POA: Diagnosis not present

## 2020-01-05 DIAGNOSIS — I110 Hypertensive heart disease with heart failure: Secondary | ICD-10-CM | POA: Diagnosis not present

## 2020-01-05 DIAGNOSIS — E118 Type 2 diabetes mellitus with unspecified complications: Secondary | ICD-10-CM | POA: Diagnosis not present

## 2020-01-05 DIAGNOSIS — I82402 Acute embolism and thrombosis of unspecified deep veins of left lower extremity: Secondary | ICD-10-CM | POA: Diagnosis not present

## 2020-01-05 DIAGNOSIS — I214 Non-ST elevation (NSTEMI) myocardial infarction: Secondary | ICD-10-CM | POA: Diagnosis not present

## 2020-01-05 DIAGNOSIS — L723 Sebaceous cyst: Secondary | ICD-10-CM | POA: Diagnosis not present

## 2020-01-05 DIAGNOSIS — C07 Malignant neoplasm of parotid gland: Secondary | ICD-10-CM | POA: Diagnosis not present

## 2020-01-05 DIAGNOSIS — Z20828 Contact with and (suspected) exposure to other viral communicable diseases: Secondary | ICD-10-CM | POA: Diagnosis not present

## 2020-01-05 DIAGNOSIS — I10 Essential (primary) hypertension: Secondary | ICD-10-CM | POA: Diagnosis not present

## 2020-01-05 DIAGNOSIS — Z7984 Long term (current) use of oral hypoglycemic drugs: Secondary | ICD-10-CM | POA: Diagnosis not present

## 2020-01-05 DIAGNOSIS — R0600 Dyspnea, unspecified: Secondary | ICD-10-CM | POA: Diagnosis not present

## 2020-01-05 DIAGNOSIS — Z7401 Bed confinement status: Secondary | ICD-10-CM | POA: Diagnosis not present

## 2020-01-05 DIAGNOSIS — D649 Anemia, unspecified: Secondary | ICD-10-CM | POA: Diagnosis not present

## 2020-01-05 DIAGNOSIS — R17 Unspecified jaundice: Secondary | ICD-10-CM | POA: Diagnosis not present

## 2020-01-05 DIAGNOSIS — R7881 Bacteremia: Secondary | ICD-10-CM | POA: Diagnosis not present

## 2020-01-05 DIAGNOSIS — I252 Old myocardial infarction: Secondary | ICD-10-CM | POA: Diagnosis not present

## 2020-01-05 DIAGNOSIS — R079 Chest pain, unspecified: Secondary | ICD-10-CM | POA: Diagnosis not present

## 2020-01-05 DIAGNOSIS — J441 Chronic obstructive pulmonary disease with (acute) exacerbation: Secondary | ICD-10-CM | POA: Diagnosis not present

## 2020-01-05 DIAGNOSIS — H34831 Tributary (branch) retinal vein occlusion, right eye, with macular edema: Secondary | ICD-10-CM | POA: Diagnosis not present

## 2020-01-05 DIAGNOSIS — G35 Multiple sclerosis: Secondary | ICD-10-CM | POA: Diagnosis not present

## 2020-01-05 DIAGNOSIS — B9561 Methicillin susceptible Staphylococcus aureus infection as the cause of diseases classified elsewhere: Secondary | ICD-10-CM | POA: Diagnosis not present

## 2020-01-05 DIAGNOSIS — R200 Anesthesia of skin: Secondary | ICD-10-CM | POA: Diagnosis not present

## 2020-01-05 DIAGNOSIS — F419 Anxiety disorder, unspecified: Secondary | ICD-10-CM | POA: Diagnosis not present

## 2020-01-05 DIAGNOSIS — C50012 Malignant neoplasm of nipple and areola, left female breast: Secondary | ICD-10-CM | POA: Diagnosis not present

## 2020-01-05 DIAGNOSIS — R0789 Other chest pain: Secondary | ICD-10-CM | POA: Diagnosis not present

## 2020-01-05 DIAGNOSIS — E1022 Type 1 diabetes mellitus with diabetic chronic kidney disease: Secondary | ICD-10-CM | POA: Diagnosis not present

## 2020-01-05 DIAGNOSIS — Z885 Allergy status to narcotic agent status: Secondary | ICD-10-CM | POA: Diagnosis not present

## 2020-01-05 DIAGNOSIS — R748 Abnormal levels of other serum enzymes: Secondary | ICD-10-CM | POA: Diagnosis not present

## 2020-01-05 DIAGNOSIS — R8290 Unspecified abnormal findings in urine: Secondary | ICD-10-CM | POA: Diagnosis not present

## 2020-01-05 DIAGNOSIS — F3341 Major depressive disorder, recurrent, in partial remission: Secondary | ICD-10-CM | POA: Diagnosis not present

## 2020-01-05 DIAGNOSIS — N183 Chronic kidney disease, stage 3 unspecified: Secondary | ICD-10-CM | POA: Diagnosis not present

## 2020-01-05 DIAGNOSIS — I4891 Unspecified atrial fibrillation: Secondary | ICD-10-CM | POA: Diagnosis not present

## 2020-01-06 DIAGNOSIS — I951 Orthostatic hypotension: Secondary | ICD-10-CM | POA: Diagnosis not present

## 2020-01-06 DIAGNOSIS — N401 Enlarged prostate with lower urinary tract symptoms: Secondary | ICD-10-CM | POA: Diagnosis not present

## 2020-01-06 DIAGNOSIS — M546 Pain in thoracic spine: Secondary | ICD-10-CM | POA: Diagnosis not present

## 2020-01-06 DIAGNOSIS — R29712 NIHSS score 12: Secondary | ICD-10-CM | POA: Diagnosis not present

## 2020-01-06 DIAGNOSIS — R339 Retention of urine, unspecified: Secondary | ICD-10-CM | POA: Diagnosis not present

## 2020-01-06 DIAGNOSIS — J9601 Acute respiratory failure with hypoxia: Secondary | ICD-10-CM | POA: Diagnosis not present

## 2020-01-06 DIAGNOSIS — E7800 Pure hypercholesterolemia, unspecified: Secondary | ICD-10-CM | POA: Diagnosis not present

## 2020-01-06 DIAGNOSIS — N179 Acute kidney failure, unspecified: Secondary | ICD-10-CM | POA: Diagnosis not present

## 2020-01-06 DIAGNOSIS — Z85820 Personal history of malignant melanoma of skin: Secondary | ICD-10-CM | POA: Diagnosis not present

## 2020-01-06 DIAGNOSIS — E785 Hyperlipidemia, unspecified: Secondary | ICD-10-CM | POA: Diagnosis not present

## 2020-01-06 DIAGNOSIS — J45998 Other asthma: Secondary | ICD-10-CM | POA: Diagnosis not present

## 2020-01-06 DIAGNOSIS — Z87891 Personal history of nicotine dependence: Secondary | ICD-10-CM | POA: Diagnosis not present

## 2020-01-06 DIAGNOSIS — C61 Malignant neoplasm of prostate: Secondary | ICD-10-CM | POA: Diagnosis not present

## 2020-01-06 DIAGNOSIS — Z905 Acquired absence of kidney: Secondary | ICD-10-CM | POA: Diagnosis not present

## 2020-01-06 DIAGNOSIS — F3341 Major depressive disorder, recurrent, in partial remission: Secondary | ICD-10-CM | POA: Diagnosis not present

## 2020-01-06 DIAGNOSIS — M152 Bouchard's nodes (with arthropathy): Secondary | ICD-10-CM | POA: Diagnosis not present

## 2020-01-06 DIAGNOSIS — M25552 Pain in left hip: Secondary | ICD-10-CM | POA: Diagnosis not present

## 2020-01-06 DIAGNOSIS — M79604 Pain in right leg: Secondary | ICD-10-CM | POA: Diagnosis not present

## 2020-01-06 DIAGNOSIS — Z96652 Presence of left artificial knee joint: Secondary | ICD-10-CM | POA: Diagnosis not present

## 2020-01-06 DIAGNOSIS — G9389 Other specified disorders of brain: Secondary | ICD-10-CM | POA: Diagnosis not present

## 2020-01-06 DIAGNOSIS — R4781 Slurred speech: Secondary | ICD-10-CM | POA: Diagnosis not present

## 2020-01-06 DIAGNOSIS — I4821 Permanent atrial fibrillation: Secondary | ICD-10-CM | POA: Diagnosis not present

## 2020-01-06 DIAGNOSIS — R29729 NIHSS score 29: Secondary | ICD-10-CM | POA: Diagnosis not present

## 2020-01-06 DIAGNOSIS — M810 Age-related osteoporosis without current pathological fracture: Secondary | ICD-10-CM | POA: Diagnosis not present

## 2020-01-06 DIAGNOSIS — Z9181 History of falling: Secondary | ICD-10-CM | POA: Diagnosis not present

## 2020-01-06 DIAGNOSIS — R4189 Other symptoms and signs involving cognitive functions and awareness: Secondary | ICD-10-CM | POA: Diagnosis not present

## 2020-01-06 DIAGNOSIS — Z96641 Presence of right artificial hip joint: Secondary | ICD-10-CM | POA: Diagnosis not present

## 2020-01-06 DIAGNOSIS — M21371 Foot drop, right foot: Secondary | ICD-10-CM | POA: Diagnosis not present

## 2020-01-06 DIAGNOSIS — R519 Headache, unspecified: Secondary | ICD-10-CM | POA: Diagnosis not present

## 2020-01-06 DIAGNOSIS — I129 Hypertensive chronic kidney disease with stage 1 through stage 4 chronic kidney disease, or unspecified chronic kidney disease: Secondary | ICD-10-CM | POA: Diagnosis not present

## 2020-01-06 DIAGNOSIS — M79605 Pain in left leg: Secondary | ICD-10-CM | POA: Diagnosis not present

## 2020-01-06 DIAGNOSIS — Z8546 Personal history of malignant neoplasm of prostate: Secondary | ICD-10-CM | POA: Diagnosis not present

## 2020-01-06 DIAGNOSIS — U071 COVID-19: Secondary | ICD-10-CM | POA: Diagnosis not present

## 2020-01-06 DIAGNOSIS — M1712 Unilateral primary osteoarthritis, left knee: Secondary | ICD-10-CM | POA: Diagnosis not present

## 2020-01-06 DIAGNOSIS — R29810 Facial weakness: Secondary | ICD-10-CM | POA: Diagnosis not present

## 2020-01-06 DIAGNOSIS — I10 Essential (primary) hypertension: Secondary | ICD-10-CM | POA: Diagnosis not present

## 2020-01-06 DIAGNOSIS — I5021 Acute systolic (congestive) heart failure: Secondary | ICD-10-CM | POA: Diagnosis not present

## 2020-01-06 DIAGNOSIS — R338 Other retention of urine: Secondary | ICD-10-CM | POA: Diagnosis not present

## 2020-01-06 DIAGNOSIS — E1022 Type 1 diabetes mellitus with diabetic chronic kidney disease: Secondary | ICD-10-CM | POA: Diagnosis not present

## 2020-01-06 DIAGNOSIS — R609 Edema, unspecified: Secondary | ICD-10-CM | POA: Diagnosis not present

## 2020-01-06 DIAGNOSIS — I340 Nonrheumatic mitral (valve) insufficiency: Secondary | ICD-10-CM | POA: Diagnosis not present

## 2020-01-06 DIAGNOSIS — E782 Mixed hyperlipidemia: Secondary | ICD-10-CM | POA: Diagnosis not present

## 2020-01-06 DIAGNOSIS — G250 Essential tremor: Secondary | ICD-10-CM | POA: Diagnosis not present

## 2020-01-06 DIAGNOSIS — G4733 Obstructive sleep apnea (adult) (pediatric): Secondary | ICD-10-CM | POA: Diagnosis not present

## 2020-01-06 DIAGNOSIS — Z20822 Contact with and (suspected) exposure to covid-19: Secondary | ICD-10-CM | POA: Diagnosis not present

## 2020-01-06 DIAGNOSIS — I160 Hypertensive urgency: Secondary | ICD-10-CM | POA: Diagnosis not present

## 2020-01-06 DIAGNOSIS — N1832 Chronic kidney disease, stage 3b: Secondary | ICD-10-CM | POA: Diagnosis not present

## 2020-01-06 DIAGNOSIS — R29711 NIHSS score 11: Secondary | ICD-10-CM | POA: Diagnosis not present

## 2020-01-06 DIAGNOSIS — F325 Major depressive disorder, single episode, in full remission: Secondary | ICD-10-CM | POA: Diagnosis not present

## 2020-01-06 DIAGNOSIS — Z466 Encounter for fitting and adjustment of urinary device: Secondary | ICD-10-CM | POA: Diagnosis not present

## 2020-01-06 DIAGNOSIS — C719 Malignant neoplasm of brain, unspecified: Secondary | ICD-10-CM | POA: Diagnosis not present

## 2020-01-06 DIAGNOSIS — C250 Malignant neoplasm of head of pancreas: Secondary | ICD-10-CM | POA: Diagnosis not present

## 2020-01-06 DIAGNOSIS — I420 Dilated cardiomyopathy: Secondary | ICD-10-CM | POA: Diagnosis not present

## 2020-01-06 DIAGNOSIS — G8194 Hemiplegia, unspecified affecting left nondominant side: Secondary | ICD-10-CM | POA: Diagnosis not present

## 2020-01-06 DIAGNOSIS — C649 Malignant neoplasm of unspecified kidney, except renal pelvis: Secondary | ICD-10-CM | POA: Diagnosis not present

## 2020-01-06 DIAGNOSIS — R29723 NIHSS score 23: Secondary | ICD-10-CM | POA: Diagnosis not present

## 2020-01-06 DIAGNOSIS — J939 Pneumothorax, unspecified: Secondary | ICD-10-CM | POA: Diagnosis not present

## 2020-01-06 DIAGNOSIS — R55 Syncope and collapse: Secondary | ICD-10-CM | POA: Diagnosis not present

## 2020-01-06 DIAGNOSIS — I63411 Cerebral infarction due to embolism of right middle cerebral artery: Secondary | ICD-10-CM | POA: Diagnosis not present

## 2020-01-06 DIAGNOSIS — G9349 Other encephalopathy: Secondary | ICD-10-CM | POA: Diagnosis not present

## 2020-01-06 DIAGNOSIS — H5340 Unspecified visual field defects: Secondary | ICD-10-CM | POA: Diagnosis not present

## 2020-01-06 DIAGNOSIS — E876 Hypokalemia: Secondary | ICD-10-CM | POA: Diagnosis not present

## 2020-01-06 DIAGNOSIS — N184 Chronic kidney disease, stage 4 (severe): Secondary | ICD-10-CM | POA: Diagnosis not present

## 2020-01-06 DIAGNOSIS — G9341 Metabolic encephalopathy: Secondary | ICD-10-CM | POA: Diagnosis not present

## 2020-01-06 DIAGNOSIS — R29716 NIHSS score 16: Secondary | ICD-10-CM | POA: Diagnosis not present

## 2020-01-06 DIAGNOSIS — R5383 Other fatigue: Secondary | ICD-10-CM | POA: Diagnosis not present

## 2020-01-06 DIAGNOSIS — N390 Urinary tract infection, site not specified: Secondary | ICD-10-CM | POA: Diagnosis not present

## 2020-01-06 DIAGNOSIS — R471 Dysarthria and anarthria: Secondary | ICD-10-CM | POA: Diagnosis not present

## 2020-01-07 DIAGNOSIS — J4540 Moderate persistent asthma, uncomplicated: Secondary | ICD-10-CM | POA: Diagnosis not present

## 2020-01-07 DIAGNOSIS — G4700 Insomnia, unspecified: Secondary | ICD-10-CM | POA: Diagnosis not present

## 2020-01-07 DIAGNOSIS — J9602 Acute respiratory failure with hypercapnia: Secondary | ICD-10-CM | POA: Diagnosis not present

## 2020-01-07 DIAGNOSIS — Q87410 Marfan's syndrome with aortic dilation: Secondary | ICD-10-CM | POA: Diagnosis not present

## 2020-01-07 DIAGNOSIS — F339 Major depressive disorder, recurrent, unspecified: Secondary | ICD-10-CM | POA: Diagnosis not present

## 2020-01-07 DIAGNOSIS — I2510 Atherosclerotic heart disease of native coronary artery without angina pectoris: Secondary | ICD-10-CM | POA: Diagnosis not present

## 2020-01-07 DIAGNOSIS — I10 Essential (primary) hypertension: Secondary | ICD-10-CM | POA: Diagnosis not present

## 2020-01-07 DIAGNOSIS — Z993 Dependence on wheelchair: Secondary | ICD-10-CM | POA: Diagnosis not present

## 2020-01-07 DIAGNOSIS — D649 Anemia, unspecified: Secondary | ICD-10-CM | POA: Diagnosis not present

## 2020-01-07 DIAGNOSIS — E785 Hyperlipidemia, unspecified: Secondary | ICD-10-CM | POA: Diagnosis not present

## 2020-01-07 DIAGNOSIS — I70234 Atherosclerosis of native arteries of right leg with ulceration of heel and midfoot: Secondary | ICD-10-CM | POA: Diagnosis not present

## 2020-01-07 DIAGNOSIS — R1313 Dysphagia, pharyngeal phase: Secondary | ICD-10-CM | POA: Diagnosis not present

## 2020-01-07 DIAGNOSIS — I83015 Varicose veins of right lower extremity with ulcer other part of foot: Secondary | ICD-10-CM | POA: Diagnosis not present

## 2020-01-07 DIAGNOSIS — E039 Hypothyroidism, unspecified: Secondary | ICD-10-CM | POA: Diagnosis not present

## 2020-01-07 DIAGNOSIS — Z7902 Long term (current) use of antithrombotics/antiplatelets: Secondary | ICD-10-CM | POA: Diagnosis not present

## 2020-01-07 DIAGNOSIS — G8929 Other chronic pain: Secondary | ICD-10-CM | POA: Diagnosis not present

## 2020-01-07 DIAGNOSIS — S92322A Displaced fracture of second metatarsal bone, left foot, initial encounter for closed fracture: Secondary | ICD-10-CM | POA: Diagnosis not present

## 2020-01-07 DIAGNOSIS — I129 Hypertensive chronic kidney disease with stage 1 through stage 4 chronic kidney disease, or unspecified chronic kidney disease: Secondary | ICD-10-CM | POA: Diagnosis not present

## 2020-01-07 DIAGNOSIS — J939 Pneumothorax, unspecified: Secondary | ICD-10-CM | POA: Diagnosis not present

## 2020-01-07 DIAGNOSIS — J439 Emphysema, unspecified: Secondary | ICD-10-CM | POA: Diagnosis not present

## 2020-01-07 DIAGNOSIS — F0390 Unspecified dementia without behavioral disturbance: Secondary | ICD-10-CM | POA: Diagnosis not present

## 2020-01-07 DIAGNOSIS — M25532 Pain in left wrist: Secondary | ICD-10-CM | POA: Diagnosis not present

## 2020-01-07 DIAGNOSIS — E559 Vitamin D deficiency, unspecified: Secondary | ICD-10-CM | POA: Diagnosis not present

## 2020-01-07 DIAGNOSIS — E786 Lipoprotein deficiency: Secondary | ICD-10-CM | POA: Diagnosis not present

## 2020-01-07 DIAGNOSIS — M542 Cervicalgia: Secondary | ICD-10-CM | POA: Diagnosis not present

## 2020-01-07 DIAGNOSIS — G629 Polyneuropathy, unspecified: Secondary | ICD-10-CM | POA: Diagnosis not present

## 2020-01-07 DIAGNOSIS — E1169 Type 2 diabetes mellitus with other specified complication: Secondary | ICD-10-CM | POA: Diagnosis not present

## 2020-01-07 DIAGNOSIS — E1140 Type 2 diabetes mellitus with diabetic neuropathy, unspecified: Secondary | ICD-10-CM | POA: Diagnosis not present

## 2020-01-07 DIAGNOSIS — L89150 Pressure ulcer of sacral region, unstageable: Secondary | ICD-10-CM | POA: Diagnosis not present

## 2020-01-07 DIAGNOSIS — E1065 Type 1 diabetes mellitus with hyperglycemia: Secondary | ICD-10-CM | POA: Diagnosis not present

## 2020-01-07 DIAGNOSIS — D509 Iron deficiency anemia, unspecified: Secondary | ICD-10-CM | POA: Diagnosis not present

## 2020-01-07 DIAGNOSIS — U071 COVID-19: Secondary | ICD-10-CM | POA: Diagnosis not present

## 2020-01-07 DIAGNOSIS — I5032 Chronic diastolic (congestive) heart failure: Secondary | ICD-10-CM | POA: Diagnosis not present

## 2020-01-07 DIAGNOSIS — M25561 Pain in right knee: Secondary | ICD-10-CM | POA: Diagnosis not present

## 2020-01-07 DIAGNOSIS — J189 Pneumonia, unspecified organism: Secondary | ICD-10-CM | POA: Diagnosis not present

## 2020-01-07 DIAGNOSIS — I350 Nonrheumatic aortic (valve) stenosis: Secondary | ICD-10-CM | POA: Diagnosis not present

## 2020-01-07 DIAGNOSIS — E119 Type 2 diabetes mellitus without complications: Secondary | ICD-10-CM | POA: Diagnosis not present

## 2020-01-07 DIAGNOSIS — S161XXA Strain of muscle, fascia and tendon at neck level, initial encounter: Secondary | ICD-10-CM | POA: Diagnosis not present

## 2020-01-07 DIAGNOSIS — M79672 Pain in left foot: Secondary | ICD-10-CM | POA: Diagnosis not present

## 2020-01-07 DIAGNOSIS — K5900 Constipation, unspecified: Secondary | ICD-10-CM | POA: Diagnosis not present

## 2020-01-07 DIAGNOSIS — N184 Chronic kidney disease, stage 4 (severe): Secondary | ICD-10-CM | POA: Diagnosis not present

## 2020-01-07 DIAGNOSIS — Z96641 Presence of right artificial hip joint: Secondary | ICD-10-CM | POA: Diagnosis not present

## 2020-01-07 DIAGNOSIS — Z03818 Encounter for observation for suspected exposure to other biological agents ruled out: Secondary | ICD-10-CM | POA: Diagnosis not present

## 2020-01-07 DIAGNOSIS — I739 Peripheral vascular disease, unspecified: Secondary | ICD-10-CM | POA: Diagnosis not present

## 2020-01-07 DIAGNOSIS — N401 Enlarged prostate with lower urinary tract symptoms: Secondary | ICD-10-CM | POA: Diagnosis not present

## 2020-01-07 DIAGNOSIS — N183 Chronic kidney disease, stage 3 unspecified: Secondary | ICD-10-CM | POA: Diagnosis not present

## 2020-01-07 DIAGNOSIS — Z79891 Long term (current) use of opiate analgesic: Secondary | ICD-10-CM | POA: Diagnosis not present

## 2020-01-07 DIAGNOSIS — E2839 Other primary ovarian failure: Secondary | ICD-10-CM | POA: Diagnosis not present

## 2020-01-07 DIAGNOSIS — Z954 Presence of other heart-valve replacement: Secondary | ICD-10-CM | POA: Diagnosis not present

## 2020-01-07 DIAGNOSIS — I4891 Unspecified atrial fibrillation: Secondary | ICD-10-CM | POA: Diagnosis not present

## 2020-01-07 DIAGNOSIS — S7292XD Unspecified fracture of left femur, subsequent encounter for closed fracture with routine healing: Secondary | ICD-10-CM | POA: Diagnosis not present

## 2020-01-07 DIAGNOSIS — H54415A Blindness right eye category 5, normal vision left eye: Secondary | ICD-10-CM | POA: Diagnosis not present

## 2020-01-07 DIAGNOSIS — E782 Mixed hyperlipidemia: Secondary | ICD-10-CM | POA: Diagnosis not present

## 2020-01-07 DIAGNOSIS — M1712 Unilateral primary osteoarthritis, left knee: Secondary | ICD-10-CM | POA: Diagnosis not present

## 2020-01-07 DIAGNOSIS — Z9012 Acquired absence of left breast and nipple: Secondary | ICD-10-CM | POA: Diagnosis not present

## 2020-01-07 DIAGNOSIS — Z794 Long term (current) use of insulin: Secondary | ICD-10-CM | POA: Diagnosis not present

## 2020-01-07 DIAGNOSIS — I214 Non-ST elevation (NSTEMI) myocardial infarction: Secondary | ICD-10-CM | POA: Diagnosis not present

## 2020-01-07 DIAGNOSIS — J449 Chronic obstructive pulmonary disease, unspecified: Secondary | ICD-10-CM | POA: Diagnosis not present

## 2020-01-07 DIAGNOSIS — G9341 Metabolic encephalopathy: Secondary | ICD-10-CM | POA: Diagnosis not present

## 2020-01-07 DIAGNOSIS — Z789 Other specified health status: Secondary | ICD-10-CM | POA: Diagnosis not present

## 2020-01-07 DIAGNOSIS — C61 Malignant neoplasm of prostate: Secondary | ICD-10-CM | POA: Diagnosis not present

## 2020-01-07 DIAGNOSIS — N1832 Chronic kidney disease, stage 3b: Secondary | ICD-10-CM | POA: Diagnosis not present

## 2020-01-07 DIAGNOSIS — E46 Unspecified protein-calorie malnutrition: Secondary | ICD-10-CM | POA: Diagnosis not present

## 2020-01-07 DIAGNOSIS — R05 Cough: Secondary | ICD-10-CM | POA: Diagnosis not present

## 2020-01-07 DIAGNOSIS — I110 Hypertensive heart disease with heart failure: Secondary | ICD-10-CM | POA: Diagnosis not present

## 2020-01-07 DIAGNOSIS — Z7984 Long term (current) use of oral hypoglycemic drugs: Secondary | ICD-10-CM | POA: Diagnosis not present

## 2020-01-07 DIAGNOSIS — L89890 Pressure ulcer of other site, unstageable: Secondary | ICD-10-CM | POA: Diagnosis not present

## 2020-01-07 DIAGNOSIS — Z20828 Contact with and (suspected) exposure to other viral communicable diseases: Secondary | ICD-10-CM | POA: Diagnosis not present

## 2020-01-07 DIAGNOSIS — I442 Atrioventricular block, complete: Secondary | ICD-10-CM | POA: Diagnosis not present

## 2020-01-07 DIAGNOSIS — I70244 Atherosclerosis of native arteries of left leg with ulceration of heel and midfoot: Secondary | ICD-10-CM | POA: Diagnosis not present

## 2020-01-07 DIAGNOSIS — E781 Pure hyperglyceridemia: Secondary | ICD-10-CM | POA: Diagnosis not present

## 2020-01-07 DIAGNOSIS — I5031 Acute diastolic (congestive) heart failure: Secondary | ICD-10-CM | POA: Diagnosis not present

## 2020-01-07 DIAGNOSIS — N179 Acute kidney failure, unspecified: Secondary | ICD-10-CM | POA: Diagnosis not present

## 2020-01-07 DIAGNOSIS — M25562 Pain in left knee: Secondary | ICD-10-CM | POA: Diagnosis not present

## 2020-01-07 DIAGNOSIS — C3412 Malignant neoplasm of upper lobe, left bronchus or lung: Secondary | ICD-10-CM | POA: Diagnosis not present

## 2020-01-07 DIAGNOSIS — E7800 Pure hypercholesterolemia, unspecified: Secondary | ICD-10-CM | POA: Diagnosis not present

## 2020-01-07 DIAGNOSIS — Z1231 Encounter for screening mammogram for malignant neoplasm of breast: Secondary | ICD-10-CM | POA: Diagnosis not present

## 2020-01-07 DIAGNOSIS — H53021 Refractive amblyopia, right eye: Secondary | ICD-10-CM | POA: Diagnosis not present

## 2020-01-07 DIAGNOSIS — J9601 Acute respiratory failure with hypoxia: Secondary | ICD-10-CM | POA: Diagnosis not present

## 2020-01-07 DIAGNOSIS — R300 Dysuria: Secondary | ICD-10-CM | POA: Diagnosis not present

## 2020-01-07 DIAGNOSIS — H2512 Age-related nuclear cataract, left eye: Secondary | ICD-10-CM | POA: Diagnosis not present

## 2020-01-07 DIAGNOSIS — E1122 Type 2 diabetes mellitus with diabetic chronic kidney disease: Secondary | ICD-10-CM | POA: Diagnosis not present

## 2020-01-07 DIAGNOSIS — M5441 Lumbago with sciatica, right side: Secondary | ICD-10-CM | POA: Diagnosis not present

## 2020-01-07 DIAGNOSIS — F329 Major depressive disorder, single episode, unspecified: Secondary | ICD-10-CM | POA: Diagnosis not present

## 2020-01-07 DIAGNOSIS — J45901 Unspecified asthma with (acute) exacerbation: Secondary | ICD-10-CM | POA: Diagnosis not present

## 2020-01-07 DIAGNOSIS — J441 Chronic obstructive pulmonary disease with (acute) exacerbation: Secondary | ICD-10-CM | POA: Diagnosis not present

## 2020-01-07 DIAGNOSIS — G894 Chronic pain syndrome: Secondary | ICD-10-CM | POA: Diagnosis not present

## 2020-01-07 DIAGNOSIS — Z9181 History of falling: Secondary | ICD-10-CM | POA: Diagnosis not present

## 2020-01-07 DIAGNOSIS — R918 Other nonspecific abnormal finding of lung field: Secondary | ICD-10-CM | POA: Diagnosis not present

## 2020-01-07 DIAGNOSIS — L89210 Pressure ulcer of right hip, unstageable: Secondary | ICD-10-CM | POA: Diagnosis not present

## 2020-01-07 DIAGNOSIS — I200 Unstable angina: Secondary | ICD-10-CM | POA: Diagnosis not present

## 2020-01-07 DIAGNOSIS — I509 Heart failure, unspecified: Secondary | ICD-10-CM | POA: Diagnosis not present

## 2020-01-07 DIAGNOSIS — M7989 Other specified soft tissue disorders: Secondary | ICD-10-CM | POA: Diagnosis not present

## 2020-01-07 DIAGNOSIS — F325 Major depressive disorder, single episode, in full remission: Secondary | ICD-10-CM | POA: Diagnosis not present

## 2020-01-07 DIAGNOSIS — Z720 Tobacco use: Secondary | ICD-10-CM | POA: Diagnosis not present

## 2020-01-07 DIAGNOSIS — S82112D Displaced fracture of left tibial spine, subsequent encounter for closed fracture with routine healing: Secondary | ICD-10-CM | POA: Diagnosis not present

## 2020-01-07 DIAGNOSIS — E1022 Type 1 diabetes mellitus with diabetic chronic kidney disease: Secondary | ICD-10-CM | POA: Diagnosis not present

## 2020-01-07 DIAGNOSIS — Z7901 Long term (current) use of anticoagulants: Secondary | ICD-10-CM | POA: Diagnosis not present

## 2020-01-07 DIAGNOSIS — Z952 Presence of prosthetic heart valve: Secondary | ICD-10-CM | POA: Diagnosis not present

## 2020-01-08 DIAGNOSIS — I5030 Unspecified diastolic (congestive) heart failure: Secondary | ICD-10-CM | POA: Diagnosis not present

## 2020-01-08 DIAGNOSIS — R5383 Other fatigue: Secondary | ICD-10-CM | POA: Diagnosis not present

## 2020-01-08 DIAGNOSIS — Z955 Presence of coronary angioplasty implant and graft: Secondary | ICD-10-CM | POA: Diagnosis not present

## 2020-01-08 DIAGNOSIS — J9621 Acute and chronic respiratory failure with hypoxia: Secondary | ICD-10-CM | POA: Diagnosis not present

## 2020-01-08 DIAGNOSIS — Z90710 Acquired absence of both cervix and uterus: Secondary | ICD-10-CM | POA: Diagnosis not present

## 2020-01-08 DIAGNOSIS — E119 Type 2 diabetes mellitus without complications: Secondary | ICD-10-CM | POA: Diagnosis not present

## 2020-01-08 DIAGNOSIS — F424 Excoriation (skin-picking) disorder: Secondary | ICD-10-CM | POA: Diagnosis not present

## 2020-01-08 DIAGNOSIS — U071 COVID-19: Secondary | ICD-10-CM | POA: Diagnosis not present

## 2020-01-08 DIAGNOSIS — R1030 Lower abdominal pain, unspecified: Secondary | ICD-10-CM | POA: Diagnosis not present

## 2020-01-08 DIAGNOSIS — Z66 Do not resuscitate: Secondary | ICD-10-CM | POA: Diagnosis not present

## 2020-01-08 DIAGNOSIS — I4891 Unspecified atrial fibrillation: Secondary | ICD-10-CM | POA: Diagnosis not present

## 2020-01-08 DIAGNOSIS — N400 Enlarged prostate without lower urinary tract symptoms: Secondary | ICD-10-CM | POA: Diagnosis not present

## 2020-01-08 DIAGNOSIS — B9561 Methicillin susceptible Staphylococcus aureus infection as the cause of diseases classified elsewhere: Secondary | ICD-10-CM | POA: Diagnosis not present

## 2020-01-08 DIAGNOSIS — J1282 Pneumonia due to coronavirus disease 2019: Secondary | ICD-10-CM | POA: Diagnosis not present

## 2020-01-08 DIAGNOSIS — G8194 Hemiplegia, unspecified affecting left nondominant side: Secondary | ICD-10-CM | POA: Diagnosis not present

## 2020-01-08 DIAGNOSIS — Z981 Arthrodesis status: Secondary | ICD-10-CM | POA: Diagnosis not present

## 2020-01-08 DIAGNOSIS — R519 Headache, unspecified: Secondary | ICD-10-CM | POA: Diagnosis not present

## 2020-01-08 DIAGNOSIS — I82432 Acute embolism and thrombosis of left popliteal vein: Secondary | ICD-10-CM | POA: Diagnosis not present

## 2020-01-08 DIAGNOSIS — N179 Acute kidney failure, unspecified: Secondary | ICD-10-CM | POA: Diagnosis not present

## 2020-01-08 DIAGNOSIS — F09 Unspecified mental disorder due to known physiological condition: Secondary | ICD-10-CM | POA: Diagnosis not present

## 2020-01-08 DIAGNOSIS — E11649 Type 2 diabetes mellitus with hypoglycemia without coma: Secondary | ICD-10-CM | POA: Diagnosis not present

## 2020-01-08 DIAGNOSIS — G992 Myelopathy in diseases classified elsewhere: Secondary | ICD-10-CM | POA: Diagnosis not present

## 2020-01-08 DIAGNOSIS — L89153 Pressure ulcer of sacral region, stage 3: Secondary | ICD-10-CM | POA: Diagnosis not present

## 2020-01-08 DIAGNOSIS — I5033 Acute on chronic diastolic (congestive) heart failure: Secondary | ICD-10-CM | POA: Diagnosis not present

## 2020-01-08 DIAGNOSIS — J449 Chronic obstructive pulmonary disease, unspecified: Secondary | ICD-10-CM | POA: Diagnosis not present

## 2020-01-08 DIAGNOSIS — T8131XA Disruption of external operation (surgical) wound, not elsewhere classified, initial encounter: Secondary | ICD-10-CM | POA: Diagnosis not present

## 2020-01-08 DIAGNOSIS — E876 Hypokalemia: Secondary | ICD-10-CM | POA: Diagnosis not present

## 2020-01-08 DIAGNOSIS — C799 Secondary malignant neoplasm of unspecified site: Secondary | ICD-10-CM | POA: Diagnosis not present

## 2020-01-08 DIAGNOSIS — K219 Gastro-esophageal reflux disease without esophagitis: Secondary | ICD-10-CM | POA: Diagnosis not present

## 2020-01-08 DIAGNOSIS — I82412 Acute embolism and thrombosis of left femoral vein: Secondary | ICD-10-CM | POA: Diagnosis not present

## 2020-01-08 DIAGNOSIS — N1832 Chronic kidney disease, stage 3b: Secondary | ICD-10-CM | POA: Diagnosis not present

## 2020-01-08 DIAGNOSIS — E785 Hyperlipidemia, unspecified: Secondary | ICD-10-CM | POA: Diagnosis not present

## 2020-01-08 DIAGNOSIS — Z794 Long term (current) use of insulin: Secondary | ICD-10-CM | POA: Diagnosis not present

## 2020-01-08 DIAGNOSIS — E663 Overweight: Secondary | ICD-10-CM | POA: Diagnosis not present

## 2020-01-08 DIAGNOSIS — F331 Major depressive disorder, recurrent, moderate: Secondary | ICD-10-CM | POA: Diagnosis not present

## 2020-01-08 DIAGNOSIS — M6281 Muscle weakness (generalized): Secondary | ICD-10-CM | POA: Diagnosis not present

## 2020-01-08 DIAGNOSIS — J9601 Acute respiratory failure with hypoxia: Secondary | ICD-10-CM | POA: Diagnosis not present

## 2020-01-08 DIAGNOSIS — F319 Bipolar disorder, unspecified: Secondary | ICD-10-CM | POA: Diagnosis not present

## 2020-01-08 DIAGNOSIS — I25110 Atherosclerotic heart disease of native coronary artery with unstable angina pectoris: Secondary | ICD-10-CM | POA: Diagnosis not present

## 2020-01-08 DIAGNOSIS — K922 Gastrointestinal hemorrhage, unspecified: Secondary | ICD-10-CM | POA: Diagnosis not present

## 2020-01-08 DIAGNOSIS — E875 Hyperkalemia: Secondary | ICD-10-CM | POA: Diagnosis not present

## 2020-01-08 DIAGNOSIS — K580 Irritable bowel syndrome with diarrhea: Secondary | ICD-10-CM | POA: Diagnosis not present

## 2020-01-08 DIAGNOSIS — E782 Mixed hyperlipidemia: Secondary | ICD-10-CM | POA: Diagnosis not present

## 2020-01-08 DIAGNOSIS — Z7982 Long term (current) use of aspirin: Secondary | ICD-10-CM | POA: Diagnosis not present

## 2020-01-08 DIAGNOSIS — K921 Melena: Secondary | ICD-10-CM | POA: Diagnosis not present

## 2020-01-08 DIAGNOSIS — R05 Cough: Secondary | ICD-10-CM | POA: Diagnosis not present

## 2020-01-08 DIAGNOSIS — S72001D Fracture of unspecified part of neck of right femur, subsequent encounter for closed fracture with routine healing: Secondary | ICD-10-CM | POA: Diagnosis not present

## 2020-01-08 DIAGNOSIS — I214 Non-ST elevation (NSTEMI) myocardial infarction: Secondary | ICD-10-CM | POA: Diagnosis not present

## 2020-01-08 DIAGNOSIS — R202 Paresthesia of skin: Secondary | ICD-10-CM | POA: Diagnosis not present

## 2020-01-08 DIAGNOSIS — Z87891 Personal history of nicotine dependence: Secondary | ICD-10-CM | POA: Diagnosis not present

## 2020-01-08 DIAGNOSIS — R0609 Other forms of dyspnea: Secondary | ICD-10-CM | POA: Diagnosis not present

## 2020-01-08 DIAGNOSIS — E43 Unspecified severe protein-calorie malnutrition: Secondary | ICD-10-CM | POA: Diagnosis not present

## 2020-01-08 DIAGNOSIS — Z9181 History of falling: Secondary | ICD-10-CM | POA: Diagnosis not present

## 2020-01-08 DIAGNOSIS — I10 Essential (primary) hypertension: Secondary | ICD-10-CM | POA: Diagnosis not present

## 2020-01-08 DIAGNOSIS — E039 Hypothyroidism, unspecified: Secondary | ICD-10-CM | POA: Diagnosis not present

## 2020-01-08 DIAGNOSIS — R262 Difficulty in walking, not elsewhere classified: Secondary | ICD-10-CM | POA: Diagnosis not present

## 2020-01-08 DIAGNOSIS — G4733 Obstructive sleep apnea (adult) (pediatric): Secondary | ICD-10-CM | POA: Diagnosis not present

## 2020-01-08 DIAGNOSIS — I739 Peripheral vascular disease, unspecified: Secondary | ICD-10-CM | POA: Diagnosis not present

## 2020-01-08 DIAGNOSIS — I471 Supraventricular tachycardia: Secondary | ICD-10-CM | POA: Diagnosis not present

## 2020-01-08 DIAGNOSIS — K7460 Unspecified cirrhosis of liver: Secondary | ICD-10-CM | POA: Diagnosis not present

## 2020-01-08 DIAGNOSIS — J9383 Other pneumothorax: Secondary | ICD-10-CM | POA: Diagnosis not present

## 2020-01-08 DIAGNOSIS — J9311 Primary spontaneous pneumothorax: Secondary | ICD-10-CM | POA: Diagnosis not present

## 2020-01-08 DIAGNOSIS — Z20828 Contact with and (suspected) exposure to other viral communicable diseases: Secondary | ICD-10-CM | POA: Diagnosis not present

## 2020-01-08 DIAGNOSIS — Z9889 Other specified postprocedural states: Secondary | ICD-10-CM | POA: Diagnosis not present

## 2020-01-08 DIAGNOSIS — Z8616 Personal history of COVID-19: Secondary | ICD-10-CM | POA: Diagnosis not present

## 2020-01-08 DIAGNOSIS — I639 Cerebral infarction, unspecified: Secondary | ICD-10-CM | POA: Diagnosis not present

## 2020-01-08 DIAGNOSIS — F419 Anxiety disorder, unspecified: Secondary | ICD-10-CM | POA: Diagnosis not present

## 2020-01-08 DIAGNOSIS — Z7902 Long term (current) use of antithrombotics/antiplatelets: Secondary | ICD-10-CM | POA: Diagnosis not present

## 2020-01-08 DIAGNOSIS — C801 Malignant (primary) neoplasm, unspecified: Secondary | ICD-10-CM | POA: Diagnosis not present

## 2020-01-08 DIAGNOSIS — J440 Chronic obstructive pulmonary disease with acute lower respiratory infection: Secondary | ICD-10-CM | POA: Diagnosis not present

## 2020-01-08 DIAGNOSIS — Z8673 Personal history of transient ischemic attack (TIA), and cerebral infarction without residual deficits: Secondary | ICD-10-CM | POA: Diagnosis not present

## 2020-01-08 DIAGNOSIS — I110 Hypertensive heart disease with heart failure: Secondary | ICD-10-CM | POA: Diagnosis not present

## 2020-01-08 DIAGNOSIS — R0981 Nasal congestion: Secondary | ICD-10-CM | POA: Diagnosis not present

## 2020-01-08 DIAGNOSIS — E1142 Type 2 diabetes mellitus with diabetic polyneuropathy: Secondary | ICD-10-CM | POA: Diagnosis not present

## 2020-01-08 DIAGNOSIS — Z86718 Personal history of other venous thrombosis and embolism: Secondary | ICD-10-CM | POA: Diagnosis not present

## 2020-01-08 DIAGNOSIS — M5412 Radiculopathy, cervical region: Secondary | ICD-10-CM | POA: Diagnosis not present

## 2020-01-08 DIAGNOSIS — I5040 Unspecified combined systolic (congestive) and diastolic (congestive) heart failure: Secondary | ICD-10-CM | POA: Diagnosis not present

## 2020-01-08 DIAGNOSIS — D62 Acute posthemorrhagic anemia: Secondary | ICD-10-CM | POA: Diagnosis not present

## 2020-01-08 DIAGNOSIS — G9340 Encephalopathy, unspecified: Secondary | ICD-10-CM | POA: Diagnosis not present

## 2020-01-08 DIAGNOSIS — M4802 Spinal stenosis, cervical region: Secondary | ICD-10-CM | POA: Diagnosis not present

## 2020-01-08 DIAGNOSIS — I82409 Acute embolism and thrombosis of unspecified deep veins of unspecified lower extremity: Secondary | ICD-10-CM | POA: Diagnosis not present

## 2020-01-08 DIAGNOSIS — I4811 Longstanding persistent atrial fibrillation: Secondary | ICD-10-CM | POA: Diagnosis not present

## 2020-01-08 DIAGNOSIS — M791 Myalgia, unspecified site: Secondary | ICD-10-CM | POA: Diagnosis not present

## 2020-01-08 DIAGNOSIS — T82898A Other specified complication of vascular prosthetic devices, implants and grafts, initial encounter: Secondary | ICD-10-CM | POA: Diagnosis not present

## 2020-01-08 DIAGNOSIS — Z6829 Body mass index (BMI) 29.0-29.9, adult: Secondary | ICD-10-CM | POA: Diagnosis not present

## 2020-01-09 DIAGNOSIS — Z79899 Other long term (current) drug therapy: Secondary | ICD-10-CM | POA: Diagnosis not present

## 2020-01-09 DIAGNOSIS — N179 Acute kidney failure, unspecified: Secondary | ICD-10-CM | POA: Diagnosis not present

## 2020-01-09 DIAGNOSIS — E785 Hyperlipidemia, unspecified: Secondary | ICD-10-CM | POA: Diagnosis not present

## 2020-01-09 DIAGNOSIS — I2699 Other pulmonary embolism without acute cor pulmonale: Secondary | ICD-10-CM | POA: Diagnosis not present

## 2020-01-09 DIAGNOSIS — Z8774 Personal history of (corrected) congenital malformations of heart and circulatory system: Secondary | ICD-10-CM | POA: Diagnosis not present

## 2020-01-09 DIAGNOSIS — N183 Chronic kidney disease, stage 3 unspecified: Secondary | ICD-10-CM | POA: Diagnosis not present

## 2020-01-09 DIAGNOSIS — Z6832 Body mass index (BMI) 32.0-32.9, adult: Secondary | ICD-10-CM | POA: Diagnosis not present

## 2020-01-09 DIAGNOSIS — Z85820 Personal history of malignant melanoma of skin: Secondary | ICD-10-CM | POA: Diagnosis not present

## 2020-01-09 DIAGNOSIS — T82857A Stenosis of cardiac prosthetic devices, implants and grafts, initial encounter: Secondary | ICD-10-CM | POA: Diagnosis not present

## 2020-01-09 DIAGNOSIS — I484 Atypical atrial flutter: Secondary | ICD-10-CM | POA: Diagnosis not present

## 2020-01-09 DIAGNOSIS — I421 Obstructive hypertrophic cardiomyopathy: Secondary | ICD-10-CM | POA: Diagnosis not present

## 2020-01-09 DIAGNOSIS — J1282 Pneumonia due to coronavirus disease 2019: Secondary | ICD-10-CM | POA: Diagnosis not present

## 2020-01-09 DIAGNOSIS — R42 Dizziness and giddiness: Secondary | ICD-10-CM | POA: Diagnosis not present

## 2020-01-09 DIAGNOSIS — I080 Rheumatic disorders of both mitral and aortic valves: Secondary | ICD-10-CM | POA: Diagnosis not present

## 2020-01-09 DIAGNOSIS — Z006 Encounter for examination for normal comparison and control in clinical research program: Secondary | ICD-10-CM | POA: Diagnosis not present

## 2020-01-09 DIAGNOSIS — H66002 Acute suppurative otitis media without spontaneous rupture of ear drum, left ear: Secondary | ICD-10-CM | POA: Diagnosis not present

## 2020-01-09 DIAGNOSIS — I824Y9 Acute embolism and thrombosis of unspecified deep veins of unspecified proximal lower extremity: Secondary | ICD-10-CM | POA: Diagnosis not present

## 2020-01-09 DIAGNOSIS — J9311 Primary spontaneous pneumothorax: Secondary | ICD-10-CM | POA: Diagnosis not present

## 2020-01-09 DIAGNOSIS — I4819 Other persistent atrial fibrillation: Secondary | ICD-10-CM | POA: Diagnosis not present

## 2020-01-09 DIAGNOSIS — I130 Hypertensive heart and chronic kidney disease with heart failure and stage 1 through stage 4 chronic kidney disease, or unspecified chronic kidney disease: Secondary | ICD-10-CM | POA: Diagnosis not present

## 2020-01-09 DIAGNOSIS — Z96651 Presence of right artificial knee joint: Secondary | ICD-10-CM | POA: Diagnosis not present

## 2020-01-09 DIAGNOSIS — K219 Gastro-esophageal reflux disease without esophagitis: Secondary | ICD-10-CM | POA: Diagnosis not present

## 2020-01-09 DIAGNOSIS — R112 Nausea with vomiting, unspecified: Secondary | ICD-10-CM | POA: Diagnosis not present

## 2020-01-09 DIAGNOSIS — E669 Obesity, unspecified: Secondary | ICD-10-CM | POA: Diagnosis not present

## 2020-01-09 DIAGNOSIS — Z7901 Long term (current) use of anticoagulants: Secondary | ICD-10-CM | POA: Diagnosis not present

## 2020-01-09 DIAGNOSIS — R002 Palpitations: Secondary | ICD-10-CM | POA: Diagnosis not present

## 2020-01-09 DIAGNOSIS — D631 Anemia in chronic kidney disease: Secondary | ICD-10-CM | POA: Diagnosis not present

## 2020-01-09 DIAGNOSIS — E7800 Pure hypercholesterolemia, unspecified: Secondary | ICD-10-CM | POA: Diagnosis not present

## 2020-01-09 DIAGNOSIS — I10 Essential (primary) hypertension: Secondary | ICD-10-CM | POA: Diagnosis not present

## 2020-01-09 DIAGNOSIS — Z8249 Family history of ischemic heart disease and other diseases of the circulatory system: Secondary | ICD-10-CM | POA: Diagnosis not present

## 2020-01-09 DIAGNOSIS — I5033 Acute on chronic diastolic (congestive) heart failure: Secondary | ICD-10-CM | POA: Diagnosis not present

## 2020-01-09 DIAGNOSIS — R1110 Vomiting, unspecified: Secondary | ICD-10-CM | POA: Diagnosis not present

## 2020-01-09 DIAGNOSIS — U071 COVID-19: Secondary | ICD-10-CM | POA: Diagnosis not present

## 2020-01-09 DIAGNOSIS — I272 Pulmonary hypertension, unspecified: Secondary | ICD-10-CM | POA: Diagnosis not present

## 2020-01-10 DIAGNOSIS — I4821 Permanent atrial fibrillation: Secondary | ICD-10-CM | POA: Diagnosis not present

## 2020-01-10 DIAGNOSIS — C50412 Malignant neoplasm of upper-outer quadrant of left female breast: Secondary | ICD-10-CM | POA: Diagnosis not present

## 2020-01-10 DIAGNOSIS — I2510 Atherosclerotic heart disease of native coronary artery without angina pectoris: Secondary | ICD-10-CM | POA: Diagnosis not present

## 2020-01-10 DIAGNOSIS — J3481 Nasal mucositis (ulcerative): Secondary | ICD-10-CM | POA: Diagnosis not present

## 2020-01-10 DIAGNOSIS — J449 Chronic obstructive pulmonary disease, unspecified: Secondary | ICD-10-CM | POA: Diagnosis not present

## 2020-01-10 DIAGNOSIS — R0602 Shortness of breath: Secondary | ICD-10-CM | POA: Diagnosis not present

## 2020-01-10 DIAGNOSIS — H2512 Age-related nuclear cataract, left eye: Secondary | ICD-10-CM | POA: Diagnosis not present

## 2020-01-10 DIAGNOSIS — C50912 Malignant neoplasm of unspecified site of left female breast: Secondary | ICD-10-CM | POA: Diagnosis not present

## 2020-01-10 DIAGNOSIS — J1289 Other viral pneumonia: Secondary | ICD-10-CM | POA: Diagnosis not present

## 2020-01-10 DIAGNOSIS — R0902 Hypoxemia: Secondary | ICD-10-CM | POA: Diagnosis not present

## 2020-01-10 DIAGNOSIS — K5790 Diverticulosis of intestine, part unspecified, without perforation or abscess without bleeding: Secondary | ICD-10-CM | POA: Diagnosis not present

## 2020-01-10 DIAGNOSIS — E1159 Type 2 diabetes mellitus with other circulatory complications: Secondary | ICD-10-CM | POA: Diagnosis not present

## 2020-01-10 DIAGNOSIS — Z6821 Body mass index (BMI) 21.0-21.9, adult: Secondary | ICD-10-CM | POA: Diagnosis not present

## 2020-01-10 DIAGNOSIS — R1084 Generalized abdominal pain: Secondary | ICD-10-CM | POA: Diagnosis not present

## 2020-01-10 DIAGNOSIS — I441 Atrioventricular block, second degree: Secondary | ICD-10-CM | POA: Diagnosis not present

## 2020-01-10 DIAGNOSIS — G894 Chronic pain syndrome: Secondary | ICD-10-CM | POA: Diagnosis not present

## 2020-01-10 DIAGNOSIS — E1165 Type 2 diabetes mellitus with hyperglycemia: Secondary | ICD-10-CM | POA: Diagnosis not present

## 2020-01-10 DIAGNOSIS — R002 Palpitations: Secondary | ICD-10-CM | POA: Diagnosis not present

## 2020-01-10 DIAGNOSIS — E559 Vitamin D deficiency, unspecified: Secondary | ICD-10-CM | POA: Diagnosis not present

## 2020-01-10 DIAGNOSIS — Z954 Presence of other heart-valve replacement: Secondary | ICD-10-CM | POA: Diagnosis not present

## 2020-01-10 DIAGNOSIS — E782 Mixed hyperlipidemia: Secondary | ICD-10-CM | POA: Diagnosis not present

## 2020-01-10 DIAGNOSIS — M8580 Other specified disorders of bone density and structure, unspecified site: Secondary | ICD-10-CM | POA: Diagnosis not present

## 2020-01-10 DIAGNOSIS — N184 Chronic kidney disease, stage 4 (severe): Secondary | ICD-10-CM | POA: Diagnosis not present

## 2020-01-10 DIAGNOSIS — C50112 Malignant neoplasm of central portion of left female breast: Secondary | ICD-10-CM | POA: Diagnosis not present

## 2020-01-10 DIAGNOSIS — I5042 Chronic combined systolic (congestive) and diastolic (congestive) heart failure: Secondary | ICD-10-CM | POA: Diagnosis not present

## 2020-01-10 DIAGNOSIS — I480 Paroxysmal atrial fibrillation: Secondary | ICD-10-CM | POA: Diagnosis not present

## 2020-01-10 DIAGNOSIS — G40909 Epilepsy, unspecified, not intractable, without status epilepticus: Secondary | ICD-10-CM | POA: Diagnosis not present

## 2020-01-10 DIAGNOSIS — Z23 Encounter for immunization: Secondary | ICD-10-CM | POA: Diagnosis not present

## 2020-01-10 DIAGNOSIS — M5489 Other dorsalgia: Secondary | ICD-10-CM | POA: Diagnosis not present

## 2020-01-10 DIAGNOSIS — K5900 Constipation, unspecified: Secondary | ICD-10-CM | POA: Diagnosis not present

## 2020-01-10 DIAGNOSIS — Z87891 Personal history of nicotine dependence: Secondary | ICD-10-CM | POA: Diagnosis not present

## 2020-01-10 DIAGNOSIS — F332 Major depressive disorder, recurrent severe without psychotic features: Secondary | ICD-10-CM | POA: Diagnosis not present

## 2020-01-10 DIAGNOSIS — Z7401 Bed confinement status: Secondary | ICD-10-CM | POA: Diagnosis not present

## 2020-01-10 DIAGNOSIS — C8510 Unspecified B-cell lymphoma, unspecified site: Secondary | ICD-10-CM | POA: Diagnosis not present

## 2020-01-10 DIAGNOSIS — G20 Parkinson's disease: Secondary | ICD-10-CM | POA: Diagnosis not present

## 2020-01-10 DIAGNOSIS — E46 Unspecified protein-calorie malnutrition: Secondary | ICD-10-CM | POA: Diagnosis not present

## 2020-01-10 DIAGNOSIS — G4700 Insomnia, unspecified: Secondary | ICD-10-CM | POA: Diagnosis not present

## 2020-01-10 DIAGNOSIS — F325 Major depressive disorder, single episode, in full remission: Secondary | ICD-10-CM | POA: Diagnosis not present

## 2020-01-10 DIAGNOSIS — R3989 Other symptoms and signs involving the genitourinary system: Secondary | ICD-10-CM | POA: Diagnosis not present

## 2020-01-10 DIAGNOSIS — M722 Plantar fascial fibromatosis: Secondary | ICD-10-CM | POA: Diagnosis not present

## 2020-01-10 DIAGNOSIS — G4733 Obstructive sleep apnea (adult) (pediatric): Secondary | ICD-10-CM | POA: Diagnosis not present

## 2020-01-10 DIAGNOSIS — M170 Bilateral primary osteoarthritis of knee: Secondary | ICD-10-CM | POA: Diagnosis not present

## 2020-01-10 DIAGNOSIS — E1129 Type 2 diabetes mellitus with other diabetic kidney complication: Secondary | ICD-10-CM | POA: Diagnosis not present

## 2020-01-10 DIAGNOSIS — M79671 Pain in right foot: Secondary | ICD-10-CM | POA: Diagnosis not present

## 2020-01-10 DIAGNOSIS — C182 Malignant neoplasm of ascending colon: Secondary | ICD-10-CM | POA: Diagnosis not present

## 2020-01-10 DIAGNOSIS — I255 Ischemic cardiomyopathy: Secondary | ICD-10-CM | POA: Diagnosis not present

## 2020-01-10 DIAGNOSIS — E538 Deficiency of other specified B group vitamins: Secondary | ICD-10-CM | POA: Diagnosis not present

## 2020-01-10 DIAGNOSIS — Z006 Encounter for examination for normal comparison and control in clinical research program: Secondary | ICD-10-CM | POA: Diagnosis not present

## 2020-01-10 DIAGNOSIS — G4730 Sleep apnea, unspecified: Secondary | ICD-10-CM | POA: Diagnosis not present

## 2020-01-10 DIAGNOSIS — T82898D Other specified complication of vascular prosthetic devices, implants and grafts, subsequent encounter: Secondary | ICD-10-CM | POA: Diagnosis not present

## 2020-01-10 DIAGNOSIS — I252 Old myocardial infarction: Secondary | ICD-10-CM | POA: Diagnosis not present

## 2020-01-10 DIAGNOSIS — J939 Pneumothorax, unspecified: Secondary | ICD-10-CM | POA: Diagnosis not present

## 2020-01-10 DIAGNOSIS — R52 Pain, unspecified: Secondary | ICD-10-CM | POA: Diagnosis not present

## 2020-01-10 DIAGNOSIS — I5022 Chronic systolic (congestive) heart failure: Secondary | ICD-10-CM | POA: Diagnosis not present

## 2020-01-10 DIAGNOSIS — E785 Hyperlipidemia, unspecified: Secondary | ICD-10-CM | POA: Diagnosis not present

## 2020-01-10 DIAGNOSIS — I350 Nonrheumatic aortic (valve) stenosis: Secondary | ICD-10-CM | POA: Diagnosis not present

## 2020-01-10 DIAGNOSIS — M79606 Pain in leg, unspecified: Secondary | ICD-10-CM | POA: Diagnosis not present

## 2020-01-10 DIAGNOSIS — J209 Acute bronchitis, unspecified: Secondary | ICD-10-CM | POA: Diagnosis not present

## 2020-01-10 DIAGNOSIS — N183 Chronic kidney disease, stage 3 unspecified: Secondary | ICD-10-CM | POA: Diagnosis not present

## 2020-01-10 DIAGNOSIS — K219 Gastro-esophageal reflux disease without esophagitis: Secondary | ICD-10-CM | POA: Diagnosis not present

## 2020-01-10 DIAGNOSIS — R269 Unspecified abnormalities of gait and mobility: Secondary | ICD-10-CM | POA: Diagnosis not present

## 2020-01-10 DIAGNOSIS — K8081 Other cholelithiasis with obstruction: Secondary | ICD-10-CM | POA: Diagnosis not present

## 2020-01-10 DIAGNOSIS — Z452 Encounter for adjustment and management of vascular access device: Secondary | ICD-10-CM | POA: Diagnosis not present

## 2020-01-10 DIAGNOSIS — C160 Malignant neoplasm of cardia: Secondary | ICD-10-CM | POA: Diagnosis not present

## 2020-01-10 DIAGNOSIS — Z8616 Personal history of COVID-19: Secondary | ICD-10-CM | POA: Diagnosis not present

## 2020-01-10 DIAGNOSIS — E440 Moderate protein-calorie malnutrition: Secondary | ICD-10-CM | POA: Diagnosis not present

## 2020-01-10 DIAGNOSIS — I4510 Unspecified right bundle-branch block: Secondary | ICD-10-CM | POA: Diagnosis not present

## 2020-01-10 DIAGNOSIS — I442 Atrioventricular block, complete: Secondary | ICD-10-CM | POA: Diagnosis not present

## 2020-01-10 DIAGNOSIS — Z48815 Encounter for surgical aftercare following surgery on the digestive system: Secondary | ICD-10-CM | POA: Diagnosis not present

## 2020-01-10 DIAGNOSIS — H25812 Combined forms of age-related cataract, left eye: Secondary | ICD-10-CM | POA: Diagnosis not present

## 2020-01-10 DIAGNOSIS — R7301 Impaired fasting glucose: Secondary | ICD-10-CM | POA: Diagnosis not present

## 2020-01-10 DIAGNOSIS — Z955 Presence of coronary angioplasty implant and graft: Secondary | ICD-10-CM | POA: Diagnosis not present

## 2020-01-10 DIAGNOSIS — E1122 Type 2 diabetes mellitus with diabetic chronic kidney disease: Secondary | ICD-10-CM | POA: Diagnosis not present

## 2020-01-10 DIAGNOSIS — I248 Other forms of acute ischemic heart disease: Secondary | ICD-10-CM | POA: Diagnosis not present

## 2020-01-10 DIAGNOSIS — C155 Malignant neoplasm of lower third of esophagus: Secondary | ICD-10-CM | POA: Diagnosis not present

## 2020-01-10 DIAGNOSIS — E7800 Pure hypercholesterolemia, unspecified: Secondary | ICD-10-CM | POA: Diagnosis not present

## 2020-01-10 DIAGNOSIS — I10 Essential (primary) hypertension: Secondary | ICD-10-CM | POA: Diagnosis not present

## 2020-01-10 DIAGNOSIS — I509 Heart failure, unspecified: Secondary | ICD-10-CM | POA: Diagnosis not present

## 2020-01-10 DIAGNOSIS — R410 Disorientation, unspecified: Secondary | ICD-10-CM | POA: Diagnosis not present

## 2020-01-10 DIAGNOSIS — I110 Hypertensive heart disease with heart failure: Secondary | ICD-10-CM | POA: Diagnosis not present

## 2020-01-10 DIAGNOSIS — M1991 Primary osteoarthritis, unspecified site: Secondary | ICD-10-CM | POA: Diagnosis not present

## 2020-01-10 DIAGNOSIS — S82112D Displaced fracture of left tibial spine, subsequent encounter for closed fracture with routine healing: Secondary | ICD-10-CM | POA: Diagnosis not present

## 2020-01-10 DIAGNOSIS — Z9181 History of falling: Secondary | ICD-10-CM | POA: Diagnosis not present

## 2020-01-10 DIAGNOSIS — E1151 Type 2 diabetes mellitus with diabetic peripheral angiopathy without gangrene: Secondary | ICD-10-CM | POA: Diagnosis not present

## 2020-01-10 DIAGNOSIS — I5032 Chronic diastolic (congestive) heart failure: Secondary | ICD-10-CM | POA: Diagnosis not present

## 2020-01-10 DIAGNOSIS — H401123 Primary open-angle glaucoma, left eye, severe stage: Secondary | ICD-10-CM | POA: Diagnosis not present

## 2020-01-10 DIAGNOSIS — Z950 Presence of cardiac pacemaker: Secondary | ICD-10-CM | POA: Diagnosis not present

## 2020-01-10 DIAGNOSIS — J90 Pleural effusion, not elsewhere classified: Secondary | ICD-10-CM | POA: Diagnosis not present

## 2020-01-10 DIAGNOSIS — F0151 Vascular dementia with behavioral disturbance: Secondary | ICD-10-CM | POA: Diagnosis not present

## 2020-01-10 DIAGNOSIS — J9601 Acute respiratory failure with hypoxia: Secondary | ICD-10-CM | POA: Diagnosis not present

## 2020-01-10 DIAGNOSIS — I420 Dilated cardiomyopathy: Secondary | ICD-10-CM | POA: Diagnosis not present

## 2020-01-10 DIAGNOSIS — R634 Abnormal weight loss: Secondary | ICD-10-CM | POA: Diagnosis not present

## 2020-01-10 DIAGNOSIS — N1832 Chronic kidney disease, stage 3b: Secondary | ICD-10-CM | POA: Diagnosis not present

## 2020-01-10 DIAGNOSIS — R0781 Pleurodynia: Secondary | ICD-10-CM | POA: Diagnosis not present

## 2020-01-10 DIAGNOSIS — T8189XA Other complications of procedures, not elsewhere classified, initial encounter: Secondary | ICD-10-CM | POA: Diagnosis not present

## 2020-01-10 DIAGNOSIS — H9190 Unspecified hearing loss, unspecified ear: Secondary | ICD-10-CM | POA: Diagnosis not present

## 2020-01-10 DIAGNOSIS — D510 Vitamin B12 deficiency anemia due to intrinsic factor deficiency: Secondary | ICD-10-CM | POA: Diagnosis not present

## 2020-01-10 DIAGNOSIS — J1282 Pneumonia due to coronavirus disease 2019: Secondary | ICD-10-CM | POA: Diagnosis not present

## 2020-01-10 DIAGNOSIS — Z9012 Acquired absence of left breast and nipple: Secondary | ICD-10-CM | POA: Diagnosis not present

## 2020-01-10 DIAGNOSIS — G8222 Paraplegia, incomplete: Secondary | ICD-10-CM | POA: Diagnosis not present

## 2020-01-10 DIAGNOSIS — I130 Hypertensive heart and chronic kidney disease with heart failure and stage 1 through stage 4 chronic kidney disease, or unspecified chronic kidney disease: Secondary | ICD-10-CM | POA: Diagnosis not present

## 2020-01-10 DIAGNOSIS — C61 Malignant neoplasm of prostate: Secondary | ICD-10-CM | POA: Diagnosis not present

## 2020-01-10 DIAGNOSIS — Z951 Presence of aortocoronary bypass graft: Secondary | ICD-10-CM | POA: Diagnosis not present

## 2020-01-10 DIAGNOSIS — M1990 Unspecified osteoarthritis, unspecified site: Secondary | ICD-10-CM | POA: Diagnosis not present

## 2020-01-10 DIAGNOSIS — H409 Unspecified glaucoma: Secondary | ICD-10-CM | POA: Diagnosis not present

## 2020-01-10 DIAGNOSIS — I340 Nonrheumatic mitral (valve) insufficiency: Secondary | ICD-10-CM | POA: Diagnosis not present

## 2020-01-10 DIAGNOSIS — H54415A Blindness right eye category 5, normal vision left eye: Secondary | ICD-10-CM | POA: Diagnosis not present

## 2020-01-10 DIAGNOSIS — Z952 Presence of prosthetic heart valve: Secondary | ICD-10-CM | POA: Diagnosis not present

## 2020-01-10 DIAGNOSIS — J441 Chronic obstructive pulmonary disease with (acute) exacerbation: Secondary | ICD-10-CM | POA: Diagnosis not present

## 2020-01-10 DIAGNOSIS — I7789 Other specified disorders of arteries and arterioles: Secondary | ICD-10-CM | POA: Diagnosis not present

## 2020-01-10 DIAGNOSIS — I5023 Acute on chronic systolic (congestive) heart failure: Secondary | ICD-10-CM | POA: Diagnosis not present

## 2020-01-10 DIAGNOSIS — F329 Major depressive disorder, single episode, unspecified: Secondary | ICD-10-CM | POA: Diagnosis not present

## 2020-01-10 DIAGNOSIS — Z6823 Body mass index (BMI) 23.0-23.9, adult: Secondary | ICD-10-CM | POA: Diagnosis not present

## 2020-01-10 DIAGNOSIS — Q87410 Marfan's syndrome with aortic dilation: Secondary | ICD-10-CM | POA: Diagnosis not present

## 2020-01-10 DIAGNOSIS — N182 Chronic kidney disease, stage 2 (mild): Secondary | ICD-10-CM | POA: Diagnosis not present

## 2020-01-10 DIAGNOSIS — Z9114 Patient's other noncompliance with medication regimen: Secondary | ICD-10-CM | POA: Diagnosis not present

## 2020-01-10 DIAGNOSIS — I447 Left bundle-branch block, unspecified: Secondary | ICD-10-CM | POA: Diagnosis not present

## 2020-01-10 DIAGNOSIS — L97519 Non-pressure chronic ulcer of other part of right foot with unspecified severity: Secondary | ICD-10-CM | POA: Diagnosis not present

## 2020-01-10 DIAGNOSIS — C9110 Chronic lymphocytic leukemia of B-cell type not having achieved remission: Secondary | ICD-10-CM | POA: Diagnosis not present

## 2020-01-10 DIAGNOSIS — R1313 Dysphagia, pharyngeal phase: Secondary | ICD-10-CM | POA: Diagnosis not present

## 2020-01-10 DIAGNOSIS — K760 Fatty (change of) liver, not elsewhere classified: Secondary | ICD-10-CM | POA: Diagnosis not present

## 2020-01-10 DIAGNOSIS — F17210 Nicotine dependence, cigarettes, uncomplicated: Secondary | ICD-10-CM | POA: Diagnosis not present

## 2020-01-10 DIAGNOSIS — E11649 Type 2 diabetes mellitus with hypoglycemia without coma: Secondary | ICD-10-CM | POA: Diagnosis not present

## 2020-01-10 DIAGNOSIS — M2550 Pain in unspecified joint: Secondary | ICD-10-CM | POA: Diagnosis not present

## 2020-01-10 DIAGNOSIS — Z8673 Personal history of transient ischemic attack (TIA), and cerebral infarction without residual deficits: Secondary | ICD-10-CM | POA: Diagnosis not present

## 2020-01-10 DIAGNOSIS — F411 Generalized anxiety disorder: Secondary | ICD-10-CM | POA: Diagnosis not present

## 2020-01-10 DIAGNOSIS — I25119 Atherosclerotic heart disease of native coronary artery with unspecified angina pectoris: Secondary | ICD-10-CM | POA: Diagnosis not present

## 2020-01-10 DIAGNOSIS — I739 Peripheral vascular disease, unspecified: Secondary | ICD-10-CM | POA: Diagnosis not present

## 2020-01-10 DIAGNOSIS — Z992 Dependence on renal dialysis: Secondary | ICD-10-CM | POA: Diagnosis not present

## 2020-01-10 DIAGNOSIS — I161 Hypertensive emergency: Secondary | ICD-10-CM | POA: Diagnosis not present

## 2020-01-10 DIAGNOSIS — Z9089 Acquired absence of other organs: Secondary | ICD-10-CM | POA: Diagnosis not present

## 2020-01-10 DIAGNOSIS — Z96641 Presence of right artificial hip joint: Secondary | ICD-10-CM | POA: Diagnosis not present

## 2020-01-10 DIAGNOSIS — I4891 Unspecified atrial fibrillation: Secondary | ICD-10-CM | POA: Diagnosis not present

## 2020-01-10 DIAGNOSIS — U071 COVID-19: Secondary | ICD-10-CM | POA: Diagnosis not present

## 2020-01-10 DIAGNOSIS — J9311 Primary spontaneous pneumothorax: Secondary | ICD-10-CM | POA: Diagnosis not present

## 2020-01-10 DIAGNOSIS — E119 Type 2 diabetes mellitus without complications: Secondary | ICD-10-CM | POA: Diagnosis not present

## 2020-01-10 DIAGNOSIS — Z8719 Personal history of other diseases of the digestive system: Secondary | ICD-10-CM | POA: Diagnosis not present

## 2020-01-10 DIAGNOSIS — K8020 Calculus of gallbladder without cholecystitis without obstruction: Secondary | ICD-10-CM | POA: Diagnosis not present

## 2020-01-10 DIAGNOSIS — R4182 Altered mental status, unspecified: Secondary | ICD-10-CM | POA: Diagnosis not present

## 2020-01-10 DIAGNOSIS — F209 Schizophrenia, unspecified: Secondary | ICD-10-CM | POA: Diagnosis not present

## 2020-01-10 DIAGNOSIS — E1142 Type 2 diabetes mellitus with diabetic polyneuropathy: Secondary | ICD-10-CM | POA: Diagnosis not present

## 2020-01-10 DIAGNOSIS — M069 Rheumatoid arthritis, unspecified: Secondary | ICD-10-CM | POA: Diagnosis not present

## 2020-01-10 DIAGNOSIS — H401113 Primary open-angle glaucoma, right eye, severe stage: Secondary | ICD-10-CM | POA: Diagnosis not present

## 2020-01-10 DIAGNOSIS — E871 Hypo-osmolality and hyponatremia: Secondary | ICD-10-CM | POA: Diagnosis not present

## 2020-01-10 DIAGNOSIS — H25811 Combined forms of age-related cataract, right eye: Secondary | ICD-10-CM | POA: Diagnosis not present

## 2020-01-10 DIAGNOSIS — B9561 Methicillin susceptible Staphylococcus aureus infection as the cause of diseases classified elsewhere: Secondary | ICD-10-CM | POA: Diagnosis not present

## 2020-01-10 DIAGNOSIS — G629 Polyneuropathy, unspecified: Secondary | ICD-10-CM | POA: Diagnosis not present

## 2020-01-10 DIAGNOSIS — T7840XA Allergy, unspecified, initial encounter: Secondary | ICD-10-CM | POA: Diagnosis not present

## 2020-01-10 DIAGNOSIS — R5382 Chronic fatigue, unspecified: Secondary | ICD-10-CM | POA: Diagnosis not present

## 2020-01-10 DIAGNOSIS — S7292XD Unspecified fracture of left femur, subsequent encounter for closed fracture with routine healing: Secondary | ICD-10-CM | POA: Diagnosis not present

## 2020-01-10 DIAGNOSIS — Z6825 Body mass index (BMI) 25.0-25.9, adult: Secondary | ICD-10-CM | POA: Diagnosis not present

## 2020-01-10 DIAGNOSIS — Z993 Dependence on wheelchair: Secondary | ICD-10-CM | POA: Diagnosis not present

## 2020-01-10 DIAGNOSIS — D649 Anemia, unspecified: Secondary | ICD-10-CM | POA: Diagnosis not present

## 2020-01-10 DIAGNOSIS — Z7901 Long term (current) use of anticoagulants: Secondary | ICD-10-CM | POA: Diagnosis not present

## 2020-01-10 DIAGNOSIS — Z7982 Long term (current) use of aspirin: Secondary | ICD-10-CM | POA: Diagnosis not present

## 2020-01-10 DIAGNOSIS — R5381 Other malaise: Secondary | ICD-10-CM | POA: Diagnosis not present

## 2020-01-10 DIAGNOSIS — H2511 Age-related nuclear cataract, right eye: Secondary | ICD-10-CM | POA: Diagnosis not present

## 2020-01-10 DIAGNOSIS — I129 Hypertensive chronic kidney disease with stage 1 through stage 4 chronic kidney disease, or unspecified chronic kidney disease: Secondary | ICD-10-CM | POA: Diagnosis not present

## 2020-01-10 DIAGNOSIS — M47816 Spondylosis without myelopathy or radiculopathy, lumbar region: Secondary | ICD-10-CM | POA: Diagnosis not present

## 2020-01-11 DIAGNOSIS — K5904 Chronic idiopathic constipation: Secondary | ICD-10-CM | POA: Diagnosis not present

## 2020-01-11 DIAGNOSIS — F419 Anxiety disorder, unspecified: Secondary | ICD-10-CM | POA: Diagnosis not present

## 2020-01-11 DIAGNOSIS — M5417 Radiculopathy, lumbosacral region: Secondary | ICD-10-CM | POA: Diagnosis not present

## 2020-01-11 DIAGNOSIS — M151 Heberden's nodes (with arthropathy): Secondary | ICD-10-CM | POA: Diagnosis not present

## 2020-01-11 DIAGNOSIS — Z4800 Encounter for change or removal of nonsurgical wound dressing: Secondary | ICD-10-CM | POA: Diagnosis not present

## 2020-01-11 DIAGNOSIS — G1221 Amyotrophic lateral sclerosis: Secondary | ICD-10-CM | POA: Diagnosis not present

## 2020-01-11 DIAGNOSIS — M4722 Other spondylosis with radiculopathy, cervical region: Secondary | ICD-10-CM | POA: Diagnosis not present

## 2020-01-11 DIAGNOSIS — M5442 Lumbago with sciatica, left side: Secondary | ICD-10-CM | POA: Diagnosis not present

## 2020-01-11 DIAGNOSIS — M545 Low back pain: Secondary | ICD-10-CM | POA: Diagnosis not present

## 2020-01-11 DIAGNOSIS — M179 Osteoarthritis of knee, unspecified: Secondary | ICD-10-CM | POA: Diagnosis not present

## 2020-01-11 DIAGNOSIS — I10 Essential (primary) hypertension: Secondary | ICD-10-CM | POA: Diagnosis not present

## 2020-01-11 DIAGNOSIS — R079 Chest pain, unspecified: Secondary | ICD-10-CM | POA: Diagnosis not present

## 2020-01-11 DIAGNOSIS — S299XXA Unspecified injury of thorax, initial encounter: Secondary | ICD-10-CM | POA: Diagnosis not present

## 2020-01-11 DIAGNOSIS — J9610 Chronic respiratory failure, unspecified whether with hypoxia or hypercapnia: Secondary | ICD-10-CM | POA: Diagnosis not present

## 2020-01-11 DIAGNOSIS — I2510 Atherosclerotic heart disease of native coronary artery without angina pectoris: Secondary | ICD-10-CM | POA: Diagnosis not present

## 2020-01-11 DIAGNOSIS — R7989 Other specified abnormal findings of blood chemistry: Secondary | ICD-10-CM | POA: Diagnosis not present

## 2020-01-11 DIAGNOSIS — J9311 Primary spontaneous pneumothorax: Secondary | ICD-10-CM | POA: Diagnosis not present

## 2020-01-11 DIAGNOSIS — Z993 Dependence on wheelchair: Secondary | ICD-10-CM | POA: Diagnosis not present

## 2020-01-11 DIAGNOSIS — Z853 Personal history of malignant neoplasm of breast: Secondary | ICD-10-CM | POA: Diagnosis not present

## 2020-01-11 DIAGNOSIS — Z1211 Encounter for screening for malignant neoplasm of colon: Secondary | ICD-10-CM | POA: Diagnosis not present

## 2020-01-11 DIAGNOSIS — M810 Age-related osteoporosis without current pathological fracture: Secondary | ICD-10-CM | POA: Diagnosis not present

## 2020-01-11 DIAGNOSIS — W57XXXS Bitten or stung by nonvenomous insect and other nonvenomous arthropods, sequela: Secondary | ICD-10-CM | POA: Diagnosis not present

## 2020-01-11 DIAGNOSIS — R5381 Other malaise: Secondary | ICD-10-CM | POA: Diagnosis not present

## 2020-01-11 DIAGNOSIS — R54 Age-related physical debility: Secondary | ICD-10-CM | POA: Diagnosis not present

## 2020-01-11 DIAGNOSIS — Z8546 Personal history of malignant neoplasm of prostate: Secondary | ICD-10-CM | POA: Diagnosis not present

## 2020-01-11 DIAGNOSIS — E1151 Type 2 diabetes mellitus with diabetic peripheral angiopathy without gangrene: Secondary | ICD-10-CM | POA: Diagnosis not present

## 2020-01-11 DIAGNOSIS — Z950 Presence of cardiac pacemaker: Secondary | ICD-10-CM | POA: Diagnosis not present

## 2020-01-11 DIAGNOSIS — I82431 Acute embolism and thrombosis of right popliteal vein: Secondary | ICD-10-CM | POA: Diagnosis not present

## 2020-01-11 DIAGNOSIS — F331 Major depressive disorder, recurrent, moderate: Secondary | ICD-10-CM | POA: Diagnosis not present

## 2020-01-11 DIAGNOSIS — G300 Alzheimer's disease with early onset: Secondary | ICD-10-CM | POA: Diagnosis not present

## 2020-01-11 DIAGNOSIS — Z6832 Body mass index (BMI) 32.0-32.9, adult: Secondary | ICD-10-CM | POA: Diagnosis not present

## 2020-01-11 DIAGNOSIS — E119 Type 2 diabetes mellitus without complications: Secondary | ICD-10-CM | POA: Diagnosis not present

## 2020-01-11 DIAGNOSIS — S46012A Strain of muscle(s) and tendon(s) of the rotator cuff of left shoulder, initial encounter: Secondary | ICD-10-CM | POA: Diagnosis not present

## 2020-01-11 DIAGNOSIS — M1611 Unilateral primary osteoarthritis, right hip: Secondary | ICD-10-CM | POA: Diagnosis not present

## 2020-01-11 DIAGNOSIS — R1310 Dysphagia, unspecified: Secondary | ICD-10-CM | POA: Diagnosis not present

## 2020-01-11 DIAGNOSIS — M9901 Segmental and somatic dysfunction of cervical region: Secondary | ICD-10-CM | POA: Diagnosis not present

## 2020-01-11 DIAGNOSIS — F329 Major depressive disorder, single episode, unspecified: Secondary | ICD-10-CM | POA: Diagnosis not present

## 2020-01-11 DIAGNOSIS — I9589 Other hypotension: Secondary | ICD-10-CM | POA: Diagnosis not present

## 2020-01-11 DIAGNOSIS — M76821 Posterior tibial tendinitis, right leg: Secondary | ICD-10-CM | POA: Diagnosis not present

## 2020-01-11 DIAGNOSIS — F3341 Major depressive disorder, recurrent, in partial remission: Secondary | ICD-10-CM | POA: Diagnosis not present

## 2020-01-11 DIAGNOSIS — R899 Unspecified abnormal finding in specimens from other organs, systems and tissues: Secondary | ICD-10-CM | POA: Diagnosis not present

## 2020-01-11 DIAGNOSIS — D510 Vitamin B12 deficiency anemia due to intrinsic factor deficiency: Secondary | ICD-10-CM | POA: Diagnosis not present

## 2020-01-11 DIAGNOSIS — Z9181 History of falling: Secondary | ICD-10-CM | POA: Diagnosis not present

## 2020-01-11 DIAGNOSIS — Z7952 Long term (current) use of systemic steroids: Secondary | ICD-10-CM | POA: Diagnosis not present

## 2020-01-11 DIAGNOSIS — N184 Chronic kidney disease, stage 4 (severe): Secondary | ICD-10-CM | POA: Diagnosis not present

## 2020-01-11 DIAGNOSIS — I120 Hypertensive chronic kidney disease with stage 5 chronic kidney disease or end stage renal disease: Secondary | ICD-10-CM | POA: Diagnosis not present

## 2020-01-11 DIAGNOSIS — N182 Chronic kidney disease, stage 2 (mild): Secondary | ICD-10-CM | POA: Diagnosis not present

## 2020-01-11 DIAGNOSIS — M531 Cervicobrachial syndrome: Secondary | ICD-10-CM | POA: Diagnosis not present

## 2020-01-11 DIAGNOSIS — Z96652 Presence of left artificial knee joint: Secondary | ICD-10-CM | POA: Diagnosis not present

## 2020-01-11 DIAGNOSIS — E039 Hypothyroidism, unspecified: Secondary | ICD-10-CM | POA: Diagnosis not present

## 2020-01-11 DIAGNOSIS — I5022 Chronic systolic (congestive) heart failure: Secondary | ICD-10-CM | POA: Diagnosis not present

## 2020-01-11 DIAGNOSIS — E782 Mixed hyperlipidemia: Secondary | ICD-10-CM | POA: Diagnosis not present

## 2020-01-11 DIAGNOSIS — M818 Other osteoporosis without current pathological fracture: Secondary | ICD-10-CM | POA: Diagnosis not present

## 2020-01-11 DIAGNOSIS — J449 Chronic obstructive pulmonary disease, unspecified: Secondary | ICD-10-CM | POA: Diagnosis not present

## 2020-01-11 DIAGNOSIS — L97929 Non-pressure chronic ulcer of unspecified part of left lower leg with unspecified severity: Secondary | ICD-10-CM | POA: Diagnosis not present

## 2020-01-11 DIAGNOSIS — Z8616 Personal history of COVID-19: Secondary | ICD-10-CM | POA: Diagnosis not present

## 2020-01-11 DIAGNOSIS — I110 Hypertensive heart disease with heart failure: Secondary | ICD-10-CM | POA: Diagnosis not present

## 2020-01-11 DIAGNOSIS — N401 Enlarged prostate with lower urinary tract symptoms: Secondary | ICD-10-CM | POA: Diagnosis not present

## 2020-01-11 DIAGNOSIS — I5032 Chronic diastolic (congestive) heart failure: Secondary | ICD-10-CM | POA: Diagnosis not present

## 2020-01-11 DIAGNOSIS — I130 Hypertensive heart and chronic kidney disease with heart failure and stage 1 through stage 4 chronic kidney disease, or unspecified chronic kidney disease: Secondary | ICD-10-CM | POA: Diagnosis not present

## 2020-01-11 DIAGNOSIS — J42 Unspecified chronic bronchitis: Secondary | ICD-10-CM | POA: Diagnosis not present

## 2020-01-11 DIAGNOSIS — L03116 Cellulitis of left lower limb: Secondary | ICD-10-CM | POA: Diagnosis not present

## 2020-01-11 DIAGNOSIS — S82142D Displaced bicondylar fracture of left tibia, subsequent encounter for closed fracture with routine healing: Secondary | ICD-10-CM | POA: Diagnosis not present

## 2020-01-11 DIAGNOSIS — Z466 Encounter for fitting and adjustment of urinary device: Secondary | ICD-10-CM | POA: Diagnosis not present

## 2020-01-11 DIAGNOSIS — N400 Enlarged prostate without lower urinary tract symptoms: Secondary | ICD-10-CM | POA: Diagnosis not present

## 2020-01-11 DIAGNOSIS — I350 Nonrheumatic aortic (valve) stenosis: Secondary | ICD-10-CM | POA: Diagnosis not present

## 2020-01-11 DIAGNOSIS — R5383 Other fatigue: Secondary | ICD-10-CM | POA: Diagnosis not present

## 2020-01-11 DIAGNOSIS — J440 Chronic obstructive pulmonary disease with acute lower respiratory infection: Secondary | ICD-10-CM | POA: Diagnosis not present

## 2020-01-11 DIAGNOSIS — E890 Postprocedural hypothyroidism: Secondary | ICD-10-CM | POA: Diagnosis not present

## 2020-01-11 DIAGNOSIS — I639 Cerebral infarction, unspecified: Secondary | ICD-10-CM | POA: Diagnosis not present

## 2020-01-11 DIAGNOSIS — E1142 Type 2 diabetes mellitus with diabetic polyneuropathy: Secondary | ICD-10-CM | POA: Diagnosis not present

## 2020-01-11 DIAGNOSIS — R0902 Hypoxemia: Secondary | ICD-10-CM | POA: Diagnosis not present

## 2020-01-11 DIAGNOSIS — K219 Gastro-esophageal reflux disease without esophagitis: Secondary | ICD-10-CM | POA: Diagnosis not present

## 2020-01-11 DIAGNOSIS — M5387 Other specified dorsopathies, lumbosacral region: Secondary | ICD-10-CM | POA: Diagnosis not present

## 2020-01-11 DIAGNOSIS — Z8719 Personal history of other diseases of the digestive system: Secondary | ICD-10-CM | POA: Diagnosis not present

## 2020-01-11 DIAGNOSIS — I252 Old myocardial infarction: Secondary | ICD-10-CM | POA: Diagnosis not present

## 2020-01-11 DIAGNOSIS — R0789 Other chest pain: Secondary | ICD-10-CM | POA: Diagnosis not present

## 2020-01-11 DIAGNOSIS — F39 Unspecified mood [affective] disorder: Secondary | ICD-10-CM | POA: Diagnosis not present

## 2020-01-11 DIAGNOSIS — N2581 Secondary hyperparathyroidism of renal origin: Secondary | ICD-10-CM | POA: Diagnosis not present

## 2020-01-11 DIAGNOSIS — Z0000 Encounter for general adult medical examination without abnormal findings: Secondary | ICD-10-CM | POA: Diagnosis not present

## 2020-01-11 DIAGNOSIS — M5441 Lumbago with sciatica, right side: Secondary | ICD-10-CM | POA: Diagnosis not present

## 2020-01-11 DIAGNOSIS — I4821 Permanent atrial fibrillation: Secondary | ICD-10-CM | POA: Diagnosis not present

## 2020-01-11 DIAGNOSIS — I872 Venous insufficiency (chronic) (peripheral): Secondary | ICD-10-CM | POA: Diagnosis not present

## 2020-01-11 DIAGNOSIS — M1712 Unilateral primary osteoarthritis, left knee: Secondary | ICD-10-CM | POA: Diagnosis not present

## 2020-01-11 DIAGNOSIS — M1990 Unspecified osteoarthritis, unspecified site: Secondary | ICD-10-CM | POA: Diagnosis not present

## 2020-01-11 DIAGNOSIS — M9904 Segmental and somatic dysfunction of sacral region: Secondary | ICD-10-CM | POA: Diagnosis not present

## 2020-01-11 DIAGNOSIS — M955 Acquired deformity of pelvis: Secondary | ICD-10-CM | POA: Diagnosis not present

## 2020-01-11 DIAGNOSIS — L97919 Non-pressure chronic ulcer of unspecified part of right lower leg with unspecified severity: Secondary | ICD-10-CM | POA: Diagnosis not present

## 2020-01-11 DIAGNOSIS — I255 Ischemic cardiomyopathy: Secondary | ICD-10-CM | POA: Diagnosis not present

## 2020-01-11 DIAGNOSIS — E860 Dehydration: Secondary | ICD-10-CM | POA: Diagnosis not present

## 2020-01-11 DIAGNOSIS — E7849 Other hyperlipidemia: Secondary | ICD-10-CM | POA: Diagnosis not present

## 2020-01-11 DIAGNOSIS — I129 Hypertensive chronic kidney disease with stage 1 through stage 4 chronic kidney disease, or unspecified chronic kidney disease: Secondary | ICD-10-CM | POA: Diagnosis not present

## 2020-01-11 DIAGNOSIS — D72829 Elevated white blood cell count, unspecified: Secondary | ICD-10-CM | POA: Diagnosis not present

## 2020-01-11 DIAGNOSIS — I5033 Acute on chronic diastolic (congestive) heart failure: Secondary | ICD-10-CM | POA: Diagnosis not present

## 2020-01-11 DIAGNOSIS — I482 Chronic atrial fibrillation, unspecified: Secondary | ICD-10-CM | POA: Diagnosis not present

## 2020-01-11 DIAGNOSIS — M170 Bilateral primary osteoarthritis of knee: Secondary | ICD-10-CM | POA: Diagnosis not present

## 2020-01-11 DIAGNOSIS — Z125 Encounter for screening for malignant neoplasm of prostate: Secondary | ICD-10-CM | POA: Diagnosis not present

## 2020-01-11 DIAGNOSIS — M25511 Pain in right shoulder: Secondary | ICD-10-CM | POA: Diagnosis not present

## 2020-01-11 DIAGNOSIS — D473 Essential (hemorrhagic) thrombocythemia: Secondary | ICD-10-CM | POA: Diagnosis not present

## 2020-01-11 DIAGNOSIS — M9903 Segmental and somatic dysfunction of lumbar region: Secondary | ICD-10-CM | POA: Diagnosis not present

## 2020-01-11 DIAGNOSIS — U071 COVID-19: Secondary | ICD-10-CM | POA: Diagnosis not present

## 2020-01-11 DIAGNOSIS — M2550 Pain in unspecified joint: Secondary | ICD-10-CM | POA: Diagnosis not present

## 2020-01-11 DIAGNOSIS — F330 Major depressive disorder, recurrent, mild: Secondary | ICD-10-CM | POA: Diagnosis not present

## 2020-01-11 DIAGNOSIS — M9902 Segmental and somatic dysfunction of thoracic region: Secondary | ICD-10-CM | POA: Diagnosis not present

## 2020-01-11 DIAGNOSIS — N186 End stage renal disease: Secondary | ICD-10-CM | POA: Diagnosis not present

## 2020-01-11 DIAGNOSIS — B999 Unspecified infectious disease: Secondary | ICD-10-CM | POA: Diagnosis not present

## 2020-01-11 DIAGNOSIS — I69311 Memory deficit following cerebral infarction: Secondary | ICD-10-CM | POA: Diagnosis not present

## 2020-01-11 DIAGNOSIS — Z96653 Presence of artificial knee joint, bilateral: Secondary | ICD-10-CM | POA: Diagnosis not present

## 2020-01-11 DIAGNOSIS — D0439 Carcinoma in situ of skin of other parts of face: Secondary | ICD-10-CM | POA: Diagnosis not present

## 2020-01-11 DIAGNOSIS — G20 Parkinson's disease: Secondary | ICD-10-CM | POA: Diagnosis not present

## 2020-01-11 DIAGNOSIS — R238 Other skin changes: Secondary | ICD-10-CM | POA: Diagnosis not present

## 2020-01-11 DIAGNOSIS — C61 Malignant neoplasm of prostate: Secondary | ICD-10-CM | POA: Diagnosis not present

## 2020-01-11 DIAGNOSIS — M859 Disorder of bone density and structure, unspecified: Secondary | ICD-10-CM | POA: Diagnosis not present

## 2020-01-11 DIAGNOSIS — E1165 Type 2 diabetes mellitus with hyperglycemia: Secondary | ICD-10-CM | POA: Diagnosis not present

## 2020-01-11 DIAGNOSIS — S82141D Displaced bicondylar fracture of right tibia, subsequent encounter for closed fracture with routine healing: Secondary | ICD-10-CM | POA: Diagnosis not present

## 2020-01-11 DIAGNOSIS — I480 Paroxysmal atrial fibrillation: Secondary | ICD-10-CM | POA: Diagnosis not present

## 2020-01-11 DIAGNOSIS — R300 Dysuria: Secondary | ICD-10-CM | POA: Diagnosis not present

## 2020-01-11 DIAGNOSIS — F0151 Vascular dementia with behavioral disturbance: Secondary | ICD-10-CM | POA: Diagnosis not present

## 2020-01-11 DIAGNOSIS — M4004 Postural kyphosis, thoracic region: Secondary | ICD-10-CM | POA: Diagnosis not present

## 2020-01-11 DIAGNOSIS — J9601 Acute respiratory failure with hypoxia: Secondary | ICD-10-CM | POA: Diagnosis not present

## 2020-01-11 DIAGNOSIS — J4521 Mild intermittent asthma with (acute) exacerbation: Secondary | ICD-10-CM | POA: Diagnosis not present

## 2020-01-11 DIAGNOSIS — G8252 Quadriplegia, C1-C4 incomplete: Secondary | ICD-10-CM | POA: Diagnosis not present

## 2020-01-11 DIAGNOSIS — S14102S Unspecified injury at C2 level of cervical spinal cord, sequela: Secondary | ICD-10-CM | POA: Diagnosis not present

## 2020-01-11 DIAGNOSIS — I5043 Acute on chronic combined systolic (congestive) and diastolic (congestive) heart failure: Secondary | ICD-10-CM | POA: Diagnosis not present

## 2020-01-11 DIAGNOSIS — D631 Anemia in chronic kidney disease: Secondary | ICD-10-CM | POA: Diagnosis not present

## 2020-01-11 DIAGNOSIS — A048 Other specified bacterial intestinal infections: Secondary | ICD-10-CM | POA: Diagnosis not present

## 2020-01-11 DIAGNOSIS — L409 Psoriasis, unspecified: Secondary | ICD-10-CM | POA: Diagnosis not present

## 2020-01-11 DIAGNOSIS — D649 Anemia, unspecified: Secondary | ICD-10-CM | POA: Diagnosis not present

## 2020-01-11 DIAGNOSIS — J1282 Pneumonia due to coronavirus disease 2019: Secondary | ICD-10-CM | POA: Diagnosis not present

## 2020-01-11 DIAGNOSIS — F322 Major depressive disorder, single episode, severe without psychotic features: Secondary | ICD-10-CM | POA: Diagnosis not present

## 2020-01-11 DIAGNOSIS — F321 Major depressive disorder, single episode, moderate: Secondary | ICD-10-CM | POA: Diagnosis not present

## 2020-01-11 DIAGNOSIS — G4733 Obstructive sleep apnea (adult) (pediatric): Secondary | ICD-10-CM | POA: Diagnosis not present

## 2020-01-11 DIAGNOSIS — R202 Paresthesia of skin: Secondary | ICD-10-CM | POA: Diagnosis not present

## 2020-01-11 DIAGNOSIS — E538 Deficiency of other specified B group vitamins: Secondary | ICD-10-CM | POA: Diagnosis not present

## 2020-01-11 DIAGNOSIS — Z5321 Procedure and treatment not carried out due to patient leaving prior to being seen by health care provider: Secondary | ICD-10-CM | POA: Diagnosis not present

## 2020-01-11 DIAGNOSIS — G9341 Metabolic encephalopathy: Secondary | ICD-10-CM | POA: Diagnosis not present

## 2020-01-11 DIAGNOSIS — N1832 Chronic kidney disease, stage 3b: Secondary | ICD-10-CM | POA: Diagnosis not present

## 2020-01-11 DIAGNOSIS — H35033 Hypertensive retinopathy, bilateral: Secondary | ICD-10-CM | POA: Diagnosis not present

## 2020-01-11 DIAGNOSIS — E1169 Type 2 diabetes mellitus with other specified complication: Secondary | ICD-10-CM | POA: Diagnosis not present

## 2020-01-11 DIAGNOSIS — J45909 Unspecified asthma, uncomplicated: Secondary | ICD-10-CM | POA: Diagnosis not present

## 2020-01-11 DIAGNOSIS — I495 Sick sinus syndrome: Secondary | ICD-10-CM | POA: Diagnosis not present

## 2020-01-11 DIAGNOSIS — F4321 Adjustment disorder with depressed mood: Secondary | ICD-10-CM | POA: Diagnosis not present

## 2020-01-11 DIAGNOSIS — E7800 Pure hypercholesterolemia, unspecified: Secondary | ICD-10-CM | POA: Diagnosis not present

## 2020-01-11 DIAGNOSIS — R42 Dizziness and giddiness: Secondary | ICD-10-CM | POA: Diagnosis not present

## 2020-01-11 DIAGNOSIS — C4922 Malignant neoplasm of connective and soft tissue of left lower limb, including hip: Secondary | ICD-10-CM | POA: Diagnosis not present

## 2020-01-11 DIAGNOSIS — N6031 Fibrosclerosis of right breast: Secondary | ICD-10-CM | POA: Diagnosis not present

## 2020-01-11 DIAGNOSIS — E669 Obesity, unspecified: Secondary | ICD-10-CM | POA: Diagnosis not present

## 2020-01-11 DIAGNOSIS — I498 Other specified cardiac arrhythmias: Secondary | ICD-10-CM | POA: Diagnosis not present

## 2020-01-11 DIAGNOSIS — Z20828 Contact with and (suspected) exposure to other viral communicable diseases: Secondary | ICD-10-CM | POA: Diagnosis not present

## 2020-01-11 DIAGNOSIS — M546 Pain in thoracic spine: Secondary | ICD-10-CM | POA: Diagnosis not present

## 2020-01-11 DIAGNOSIS — Z9229 Personal history of other drug therapy: Secondary | ICD-10-CM | POA: Diagnosis not present

## 2020-01-11 DIAGNOSIS — Z79899 Other long term (current) drug therapy: Secondary | ICD-10-CM | POA: Diagnosis not present

## 2020-01-11 DIAGNOSIS — E1140 Type 2 diabetes mellitus with diabetic neuropathy, unspecified: Secondary | ICD-10-CM | POA: Diagnosis not present

## 2020-01-11 DIAGNOSIS — G464 Cerebellar stroke syndrome: Secondary | ICD-10-CM | POA: Diagnosis not present

## 2020-01-11 DIAGNOSIS — M79671 Pain in right foot: Secondary | ICD-10-CM | POA: Diagnosis not present

## 2020-01-11 DIAGNOSIS — E781 Pure hyperglyceridemia: Secondary | ICD-10-CM | POA: Diagnosis not present

## 2020-01-11 DIAGNOSIS — F324 Major depressive disorder, single episode, in partial remission: Secondary | ICD-10-CM | POA: Diagnosis not present

## 2020-01-11 DIAGNOSIS — I4891 Unspecified atrial fibrillation: Secondary | ICD-10-CM | POA: Diagnosis not present

## 2020-01-11 DIAGNOSIS — E1122 Type 2 diabetes mellitus with diabetic chronic kidney disease: Secondary | ICD-10-CM | POA: Diagnosis not present

## 2020-01-11 DIAGNOSIS — M9905 Segmental and somatic dysfunction of pelvic region: Secondary | ICD-10-CM | POA: Diagnosis not present

## 2020-01-11 DIAGNOSIS — E785 Hyperlipidemia, unspecified: Secondary | ICD-10-CM | POA: Diagnosis not present

## 2020-01-12 DIAGNOSIS — R0602 Shortness of breath: Secondary | ICD-10-CM | POA: Diagnosis not present

## 2020-01-12 DIAGNOSIS — Z03818 Encounter for observation for suspected exposure to other biological agents ruled out: Secondary | ICD-10-CM | POA: Diagnosis not present

## 2020-01-12 DIAGNOSIS — Z79899 Other long term (current) drug therapy: Secondary | ICD-10-CM | POA: Diagnosis not present

## 2020-01-12 DIAGNOSIS — E782 Mixed hyperlipidemia: Secondary | ICD-10-CM | POA: Diagnosis not present

## 2020-01-12 DIAGNOSIS — E119 Type 2 diabetes mellitus without complications: Secondary | ICD-10-CM | POA: Diagnosis not present

## 2020-01-12 DIAGNOSIS — I129 Hypertensive chronic kidney disease with stage 1 through stage 4 chronic kidney disease, or unspecified chronic kidney disease: Secondary | ICD-10-CM | POA: Diagnosis not present

## 2020-01-12 DIAGNOSIS — S42144A Nondisplaced fracture of glenoid cavity of scapula, right shoulder, initial encounter for closed fracture: Secondary | ICD-10-CM | POA: Diagnosis not present

## 2020-01-12 DIAGNOSIS — R112 Nausea with vomiting, unspecified: Secondary | ICD-10-CM | POA: Diagnosis not present

## 2020-01-12 DIAGNOSIS — N400 Enlarged prostate without lower urinary tract symptoms: Secondary | ICD-10-CM | POA: Diagnosis not present

## 2020-01-12 DIAGNOSIS — R600 Localized edema: Secondary | ICD-10-CM | POA: Diagnosis not present

## 2020-01-12 DIAGNOSIS — M47816 Spondylosis without myelopathy or radiculopathy, lumbar region: Secondary | ICD-10-CM | POA: Diagnosis not present

## 2020-01-12 DIAGNOSIS — I5033 Acute on chronic diastolic (congestive) heart failure: Secondary | ICD-10-CM | POA: Diagnosis not present

## 2020-01-12 DIAGNOSIS — Z961 Presence of intraocular lens: Secondary | ICD-10-CM | POA: Diagnosis not present

## 2020-01-12 DIAGNOSIS — E1129 Type 2 diabetes mellitus with other diabetic kidney complication: Secondary | ICD-10-CM | POA: Diagnosis not present

## 2020-01-12 DIAGNOSIS — Z9181 History of falling: Secondary | ICD-10-CM | POA: Diagnosis not present

## 2020-01-12 DIAGNOSIS — J449 Chronic obstructive pulmonary disease, unspecified: Secondary | ICD-10-CM | POA: Diagnosis not present

## 2020-01-12 DIAGNOSIS — J9601 Acute respiratory failure with hypoxia: Secondary | ICD-10-CM | POA: Diagnosis not present

## 2020-01-12 DIAGNOSIS — F331 Major depressive disorder, recurrent, moderate: Secondary | ICD-10-CM | POA: Diagnosis not present

## 2020-01-12 DIAGNOSIS — Z7984 Long term (current) use of oral hypoglycemic drugs: Secondary | ICD-10-CM | POA: Diagnosis not present

## 2020-01-12 DIAGNOSIS — G8114 Spastic hemiplegia affecting left nondominant side: Secondary | ICD-10-CM | POA: Diagnosis not present

## 2020-01-12 DIAGNOSIS — H353113 Nonexudative age-related macular degeneration, right eye, advanced atrophic without subfoveal involvement: Secondary | ICD-10-CM | POA: Diagnosis not present

## 2020-01-12 DIAGNOSIS — I4891 Unspecified atrial fibrillation: Secondary | ICD-10-CM | POA: Diagnosis not present

## 2020-01-12 DIAGNOSIS — R0681 Apnea, not elsewhere classified: Secondary | ICD-10-CM | POA: Diagnosis not present

## 2020-01-12 DIAGNOSIS — M179 Osteoarthritis of knee, unspecified: Secondary | ICD-10-CM | POA: Diagnosis not present

## 2020-01-12 DIAGNOSIS — Z79891 Long term (current) use of opiate analgesic: Secondary | ICD-10-CM | POA: Diagnosis not present

## 2020-01-12 DIAGNOSIS — J9311 Primary spontaneous pneumothorax: Secondary | ICD-10-CM | POA: Diagnosis not present

## 2020-01-12 DIAGNOSIS — H04123 Dry eye syndrome of bilateral lacrimal glands: Secondary | ICD-10-CM | POA: Diagnosis not present

## 2020-01-12 DIAGNOSIS — K209 Esophagitis, unspecified without bleeding: Secondary | ICD-10-CM | POA: Diagnosis not present

## 2020-01-12 DIAGNOSIS — I2510 Atherosclerotic heart disease of native coronary artery without angina pectoris: Secondary | ICD-10-CM | POA: Diagnosis not present

## 2020-01-12 DIAGNOSIS — G9341 Metabolic encephalopathy: Secondary | ICD-10-CM | POA: Diagnosis not present

## 2020-01-12 DIAGNOSIS — R161 Splenomegaly, not elsewhere classified: Secondary | ICD-10-CM | POA: Diagnosis not present

## 2020-01-12 DIAGNOSIS — M79641 Pain in right hand: Secondary | ICD-10-CM | POA: Diagnosis not present

## 2020-01-12 DIAGNOSIS — Z85038 Personal history of other malignant neoplasm of large intestine: Secondary | ICD-10-CM | POA: Diagnosis not present

## 2020-01-12 DIAGNOSIS — R079 Chest pain, unspecified: Secondary | ICD-10-CM | POA: Diagnosis not present

## 2020-01-12 DIAGNOSIS — G4700 Insomnia, unspecified: Secondary | ICD-10-CM | POA: Diagnosis not present

## 2020-01-12 DIAGNOSIS — I5032 Chronic diastolic (congestive) heart failure: Secondary | ICD-10-CM | POA: Diagnosis not present

## 2020-01-12 DIAGNOSIS — F3170 Bipolar disorder, currently in remission, most recent episode unspecified: Secondary | ICD-10-CM | POA: Diagnosis not present

## 2020-01-12 DIAGNOSIS — E7800 Pure hypercholesterolemia, unspecified: Secondary | ICD-10-CM | POA: Diagnosis not present

## 2020-01-12 DIAGNOSIS — Z6832 Body mass index (BMI) 32.0-32.9, adult: Secondary | ICD-10-CM | POA: Diagnosis not present

## 2020-01-12 DIAGNOSIS — M542 Cervicalgia: Secondary | ICD-10-CM | POA: Diagnosis not present

## 2020-01-12 DIAGNOSIS — E1169 Type 2 diabetes mellitus with other specified complication: Secondary | ICD-10-CM | POA: Diagnosis not present

## 2020-01-12 DIAGNOSIS — E1165 Type 2 diabetes mellitus with hyperglycemia: Secondary | ICD-10-CM | POA: Diagnosis not present

## 2020-01-12 DIAGNOSIS — Z87891 Personal history of nicotine dependence: Secondary | ICD-10-CM | POA: Diagnosis not present

## 2020-01-12 DIAGNOSIS — I130 Hypertensive heart and chronic kidney disease with heart failure and stage 1 through stage 4 chronic kidney disease, or unspecified chronic kidney disease: Secondary | ICD-10-CM | POA: Diagnosis not present

## 2020-01-12 DIAGNOSIS — I25119 Atherosclerotic heart disease of native coronary artery with unspecified angina pectoris: Secondary | ICD-10-CM | POA: Diagnosis not present

## 2020-01-12 DIAGNOSIS — D801 Nonfamilial hypogammaglobulinemia: Secondary | ICD-10-CM | POA: Diagnosis not present

## 2020-01-12 DIAGNOSIS — E1140 Type 2 diabetes mellitus with diabetic neuropathy, unspecified: Secondary | ICD-10-CM | POA: Diagnosis not present

## 2020-01-12 DIAGNOSIS — F325 Major depressive disorder, single episode, in full remission: Secondary | ICD-10-CM | POA: Diagnosis not present

## 2020-01-12 DIAGNOSIS — Z7901 Long term (current) use of anticoagulants: Secondary | ICD-10-CM | POA: Diagnosis not present

## 2020-01-12 DIAGNOSIS — R809 Proteinuria, unspecified: Secondary | ICD-10-CM | POA: Diagnosis not present

## 2020-01-12 DIAGNOSIS — G4730 Sleep apnea, unspecified: Secondary | ICD-10-CM | POA: Diagnosis not present

## 2020-01-12 DIAGNOSIS — N189 Chronic kidney disease, unspecified: Secondary | ICD-10-CM | POA: Diagnosis not present

## 2020-01-12 DIAGNOSIS — I429 Cardiomyopathy, unspecified: Secondary | ICD-10-CM | POA: Diagnosis not present

## 2020-01-12 DIAGNOSIS — M1991 Primary osteoarthritis, unspecified site: Secondary | ICD-10-CM | POA: Diagnosis not present

## 2020-01-12 DIAGNOSIS — N170 Acute kidney failure with tubular necrosis: Secondary | ICD-10-CM | POA: Diagnosis not present

## 2020-01-12 DIAGNOSIS — D649 Anemia, unspecified: Secondary | ICD-10-CM | POA: Diagnosis not present

## 2020-01-12 DIAGNOSIS — Z7409 Other reduced mobility: Secondary | ICD-10-CM | POA: Diagnosis not present

## 2020-01-12 DIAGNOSIS — M152 Bouchard's nodes (with arthropathy): Secondary | ICD-10-CM | POA: Diagnosis not present

## 2020-01-12 DIAGNOSIS — G4733 Obstructive sleep apnea (adult) (pediatric): Secondary | ICD-10-CM | POA: Diagnosis not present

## 2020-01-12 DIAGNOSIS — E46 Unspecified protein-calorie malnutrition: Secondary | ICD-10-CM | POA: Diagnosis not present

## 2020-01-12 DIAGNOSIS — C61 Malignant neoplasm of prostate: Secondary | ICD-10-CM | POA: Diagnosis not present

## 2020-01-12 DIAGNOSIS — N183 Chronic kidney disease, stage 3 unspecified: Secondary | ICD-10-CM | POA: Diagnosis not present

## 2020-01-12 DIAGNOSIS — Z853 Personal history of malignant neoplasm of breast: Secondary | ICD-10-CM | POA: Diagnosis not present

## 2020-01-12 DIAGNOSIS — I480 Paroxysmal atrial fibrillation: Secondary | ICD-10-CM | POA: Diagnosis not present

## 2020-01-12 DIAGNOSIS — I5020 Unspecified systolic (congestive) heart failure: Secondary | ICD-10-CM | POA: Diagnosis not present

## 2020-01-12 DIAGNOSIS — D510 Vitamin B12 deficiency anemia due to intrinsic factor deficiency: Secondary | ICD-10-CM | POA: Diagnosis not present

## 2020-01-12 DIAGNOSIS — F322 Major depressive disorder, single episode, severe without psychotic features: Secondary | ICD-10-CM | POA: Diagnosis not present

## 2020-01-12 DIAGNOSIS — I7300 Raynaud's syndrome without gangrene: Secondary | ICD-10-CM | POA: Diagnosis not present

## 2020-01-12 DIAGNOSIS — I10 Essential (primary) hypertension: Secondary | ICD-10-CM | POA: Diagnosis not present

## 2020-01-12 DIAGNOSIS — I82409 Acute embolism and thrombosis of unspecified deep veins of unspecified lower extremity: Secondary | ICD-10-CM | POA: Diagnosis not present

## 2020-01-12 DIAGNOSIS — K208 Other esophagitis without bleeding: Secondary | ICD-10-CM | POA: Diagnosis not present

## 2020-01-12 DIAGNOSIS — B3781 Candidal esophagitis: Secondary | ICD-10-CM | POA: Diagnosis not present

## 2020-01-12 DIAGNOSIS — Z8673 Personal history of transient ischemic attack (TIA), and cerebral infarction without residual deficits: Secondary | ICD-10-CM | POA: Diagnosis not present

## 2020-01-12 DIAGNOSIS — B0089 Other herpesviral infection: Secondary | ICD-10-CM | POA: Diagnosis not present

## 2020-01-12 DIAGNOSIS — R0989 Other specified symptoms and signs involving the circulatory and respiratory systems: Secondary | ICD-10-CM | POA: Diagnosis not present

## 2020-01-12 DIAGNOSIS — R262 Difficulty in walking, not elsewhere classified: Secondary | ICD-10-CM | POA: Diagnosis not present

## 2020-01-12 DIAGNOSIS — M1712 Unilateral primary osteoarthritis, left knee: Secondary | ICD-10-CM | POA: Diagnosis not present

## 2020-01-12 DIAGNOSIS — K30 Functional dyspepsia: Secondary | ICD-10-CM | POA: Diagnosis not present

## 2020-01-12 DIAGNOSIS — E211 Secondary hyperparathyroidism, not elsewhere classified: Secondary | ICD-10-CM | POA: Diagnosis not present

## 2020-01-12 DIAGNOSIS — M9902 Segmental and somatic dysfunction of thoracic region: Secondary | ICD-10-CM | POA: Diagnosis not present

## 2020-01-12 DIAGNOSIS — Z733 Stress, not elsewhere classified: Secondary | ICD-10-CM | POA: Diagnosis not present

## 2020-01-12 DIAGNOSIS — K219 Gastro-esophageal reflux disease without esophagitis: Secondary | ICD-10-CM | POA: Diagnosis not present

## 2020-01-12 DIAGNOSIS — Z23 Encounter for immunization: Secondary | ICD-10-CM | POA: Diagnosis not present

## 2020-01-12 DIAGNOSIS — J4520 Mild intermittent asthma, uncomplicated: Secondary | ICD-10-CM | POA: Diagnosis not present

## 2020-01-12 DIAGNOSIS — M6281 Muscle weakness (generalized): Secondary | ICD-10-CM | POA: Diagnosis not present

## 2020-01-12 DIAGNOSIS — I110 Hypertensive heart disease with heart failure: Secondary | ICD-10-CM | POA: Diagnosis not present

## 2020-01-12 DIAGNOSIS — Z471 Aftercare following joint replacement surgery: Secondary | ICD-10-CM | POA: Diagnosis not present

## 2020-01-12 DIAGNOSIS — K7460 Unspecified cirrhosis of liver: Secondary | ICD-10-CM | POA: Diagnosis not present

## 2020-01-12 DIAGNOSIS — R5381 Other malaise: Secondary | ICD-10-CM | POA: Diagnosis not present

## 2020-01-12 DIAGNOSIS — M79642 Pain in left hand: Secondary | ICD-10-CM | POA: Diagnosis not present

## 2020-01-12 DIAGNOSIS — G4721 Circadian rhythm sleep disorder, delayed sleep phase type: Secondary | ICD-10-CM | POA: Diagnosis not present

## 2020-01-12 DIAGNOSIS — M1990 Unspecified osteoarthritis, unspecified site: Secondary | ICD-10-CM | POA: Diagnosis not present

## 2020-01-12 DIAGNOSIS — Z7982 Long term (current) use of aspirin: Secondary | ICD-10-CM | POA: Diagnosis not present

## 2020-01-12 DIAGNOSIS — M65311 Trigger thumb, right thumb: Secondary | ICD-10-CM | POA: Diagnosis not present

## 2020-01-12 DIAGNOSIS — I509 Heart failure, unspecified: Secondary | ICD-10-CM | POA: Diagnosis not present

## 2020-01-12 DIAGNOSIS — R7989 Other specified abnormal findings of blood chemistry: Secondary | ICD-10-CM | POA: Diagnosis not present

## 2020-01-12 DIAGNOSIS — K649 Unspecified hemorrhoids: Secondary | ICD-10-CM | POA: Diagnosis not present

## 2020-01-12 DIAGNOSIS — R972 Elevated prostate specific antigen [PSA]: Secondary | ICD-10-CM | POA: Diagnosis not present

## 2020-01-12 DIAGNOSIS — E559 Vitamin D deficiency, unspecified: Secondary | ICD-10-CM | POA: Diagnosis not present

## 2020-01-12 DIAGNOSIS — D696 Thrombocytopenia, unspecified: Secondary | ICD-10-CM | POA: Diagnosis not present

## 2020-01-12 DIAGNOSIS — K449 Diaphragmatic hernia without obstruction or gangrene: Secondary | ICD-10-CM | POA: Diagnosis not present

## 2020-01-12 DIAGNOSIS — M79672 Pain in left foot: Secondary | ICD-10-CM | POA: Diagnosis not present

## 2020-01-12 DIAGNOSIS — J90 Pleural effusion, not elsewhere classified: Secondary | ICD-10-CM | POA: Diagnosis not present

## 2020-01-12 DIAGNOSIS — Z20828 Contact with and (suspected) exposure to other viral communicable diseases: Secondary | ICD-10-CM | POA: Diagnosis not present

## 2020-01-12 DIAGNOSIS — Z45018 Encounter for adjustment and management of other part of cardiac pacemaker: Secondary | ICD-10-CM | POA: Diagnosis not present

## 2020-01-12 DIAGNOSIS — M5134 Other intervertebral disc degeneration, thoracic region: Secondary | ICD-10-CM | POA: Diagnosis not present

## 2020-01-12 DIAGNOSIS — E1122 Type 2 diabetes mellitus with diabetic chronic kidney disease: Secondary | ICD-10-CM | POA: Diagnosis not present

## 2020-01-12 DIAGNOSIS — I428 Other cardiomyopathies: Secondary | ICD-10-CM | POA: Diagnosis not present

## 2020-01-12 DIAGNOSIS — F411 Generalized anxiety disorder: Secondary | ICD-10-CM | POA: Diagnosis not present

## 2020-01-12 DIAGNOSIS — M8580 Other specified disorders of bone density and structure, unspecified site: Secondary | ICD-10-CM | POA: Diagnosis not present

## 2020-01-12 DIAGNOSIS — M79671 Pain in right foot: Secondary | ICD-10-CM | POA: Diagnosis not present

## 2020-01-12 DIAGNOSIS — M25562 Pain in left knee: Secondary | ICD-10-CM | POA: Diagnosis not present

## 2020-01-12 DIAGNOSIS — E785 Hyperlipidemia, unspecified: Secondary | ICD-10-CM | POA: Diagnosis not present

## 2020-01-12 DIAGNOSIS — N184 Chronic kidney disease, stage 4 (severe): Secondary | ICD-10-CM | POA: Diagnosis not present

## 2020-01-12 DIAGNOSIS — J440 Chronic obstructive pulmonary disease with acute lower respiratory infection: Secondary | ICD-10-CM | POA: Diagnosis not present

## 2020-01-12 DIAGNOSIS — Z9884 Bariatric surgery status: Secondary | ICD-10-CM | POA: Diagnosis not present

## 2020-01-12 DIAGNOSIS — Z96651 Presence of right artificial knee joint: Secondary | ICD-10-CM | POA: Diagnosis not present

## 2020-01-12 DIAGNOSIS — K5790 Diverticulosis of intestine, part unspecified, without perforation or abscess without bleeding: Secondary | ICD-10-CM | POA: Diagnosis not present

## 2020-01-12 DIAGNOSIS — M19011 Primary osteoarthritis, right shoulder: Secondary | ICD-10-CM | POA: Diagnosis not present

## 2020-01-12 DIAGNOSIS — M170 Bilateral primary osteoarthritis of knee: Secondary | ICD-10-CM | POA: Diagnosis not present

## 2020-01-12 DIAGNOSIS — U071 COVID-19: Secondary | ICD-10-CM | POA: Diagnosis not present

## 2020-01-12 DIAGNOSIS — M25561 Pain in right knee: Secondary | ICD-10-CM | POA: Diagnosis not present

## 2020-01-12 DIAGNOSIS — K5900 Constipation, unspecified: Secondary | ICD-10-CM | POA: Diagnosis not present

## 2020-01-12 DIAGNOSIS — D631 Anemia in chronic kidney disease: Secondary | ICD-10-CM | POA: Diagnosis not present

## 2020-01-12 DIAGNOSIS — H353131 Nonexudative age-related macular degeneration, bilateral, early dry stage: Secondary | ICD-10-CM | POA: Diagnosis not present

## 2020-01-12 DIAGNOSIS — W19XXXA Unspecified fall, initial encounter: Secondary | ICD-10-CM | POA: Diagnosis not present

## 2020-01-12 DIAGNOSIS — R0683 Snoring: Secondary | ICD-10-CM | POA: Diagnosis not present

## 2020-01-12 DIAGNOSIS — R300 Dysuria: Secondary | ICD-10-CM | POA: Diagnosis not present

## 2020-01-12 DIAGNOSIS — R29898 Other symptoms and signs involving the musculoskeletal system: Secondary | ICD-10-CM | POA: Diagnosis not present

## 2020-01-12 DIAGNOSIS — F0390 Unspecified dementia without behavioral disturbance: Secondary | ICD-10-CM | POA: Diagnosis not present

## 2020-01-12 DIAGNOSIS — Z8546 Personal history of malignant neoplasm of prostate: Secondary | ICD-10-CM | POA: Diagnosis not present

## 2020-01-12 DIAGNOSIS — F329 Major depressive disorder, single episode, unspecified: Secondary | ICD-10-CM | POA: Diagnosis not present

## 2020-01-12 DIAGNOSIS — J188 Other pneumonia, unspecified organism: Secondary | ICD-10-CM | POA: Diagnosis not present

## 2020-01-12 DIAGNOSIS — D509 Iron deficiency anemia, unspecified: Secondary | ICD-10-CM | POA: Diagnosis not present

## 2020-01-12 DIAGNOSIS — Z1331 Encounter for screening for depression: Secondary | ICD-10-CM | POA: Diagnosis not present

## 2020-01-12 DIAGNOSIS — I4892 Unspecified atrial flutter: Secondary | ICD-10-CM | POA: Diagnosis not present

## 2020-01-12 DIAGNOSIS — J1282 Pneumonia due to coronavirus disease 2019: Secondary | ICD-10-CM | POA: Diagnosis not present

## 2020-01-12 DIAGNOSIS — Z6831 Body mass index (BMI) 31.0-31.9, adult: Secondary | ICD-10-CM | POA: Diagnosis not present

## 2020-01-12 DIAGNOSIS — E441 Mild protein-calorie malnutrition: Secondary | ICD-10-CM | POA: Diagnosis not present

## 2020-01-12 DIAGNOSIS — I83812 Varicose veins of left lower extremities with pain: Secondary | ICD-10-CM | POA: Diagnosis not present

## 2020-01-12 DIAGNOSIS — K7469 Other cirrhosis of liver: Secondary | ICD-10-CM | POA: Diagnosis not present

## 2020-01-12 DIAGNOSIS — R441 Visual hallucinations: Secondary | ICD-10-CM | POA: Diagnosis not present

## 2020-01-13 DIAGNOSIS — E7800 Pure hypercholesterolemia, unspecified: Secondary | ICD-10-CM | POA: Diagnosis not present

## 2020-01-13 DIAGNOSIS — J45909 Unspecified asthma, uncomplicated: Secondary | ICD-10-CM | POA: Diagnosis not present

## 2020-01-13 DIAGNOSIS — I82409 Acute embolism and thrombosis of unspecified deep veins of unspecified lower extremity: Secondary | ICD-10-CM | POA: Diagnosis not present

## 2020-01-13 DIAGNOSIS — E1142 Type 2 diabetes mellitus with diabetic polyneuropathy: Secondary | ICD-10-CM | POA: Diagnosis not present

## 2020-01-13 DIAGNOSIS — Z79899 Other long term (current) drug therapy: Secondary | ICD-10-CM | POA: Diagnosis not present

## 2020-01-13 DIAGNOSIS — J410 Simple chronic bronchitis: Secondary | ICD-10-CM | POA: Diagnosis not present

## 2020-01-13 DIAGNOSIS — M1990 Unspecified osteoarthritis, unspecified site: Secondary | ICD-10-CM | POA: Diagnosis not present

## 2020-01-13 DIAGNOSIS — S72001D Fracture of unspecified part of neck of right femur, subsequent encounter for closed fracture with routine healing: Secondary | ICD-10-CM | POA: Diagnosis not present

## 2020-01-13 DIAGNOSIS — R0789 Other chest pain: Secondary | ICD-10-CM | POA: Diagnosis not present

## 2020-01-13 DIAGNOSIS — E785 Hyperlipidemia, unspecified: Secondary | ICD-10-CM | POA: Diagnosis not present

## 2020-01-13 DIAGNOSIS — I4811 Longstanding persistent atrial fibrillation: Secondary | ICD-10-CM | POA: Diagnosis not present

## 2020-01-13 DIAGNOSIS — C61 Malignant neoplasm of prostate: Secondary | ICD-10-CM | POA: Diagnosis not present

## 2020-01-13 DIAGNOSIS — F3340 Major depressive disorder, recurrent, in remission, unspecified: Secondary | ICD-10-CM | POA: Diagnosis not present

## 2020-01-13 DIAGNOSIS — D509 Iron deficiency anemia, unspecified: Secondary | ICD-10-CM | POA: Diagnosis not present

## 2020-01-13 DIAGNOSIS — N1832 Chronic kidney disease, stage 3b: Secondary | ICD-10-CM | POA: Diagnosis not present

## 2020-01-13 DIAGNOSIS — F322 Major depressive disorder, single episode, severe without psychotic features: Secondary | ICD-10-CM | POA: Diagnosis not present

## 2020-01-13 DIAGNOSIS — J40 Bronchitis, not specified as acute or chronic: Secondary | ICD-10-CM | POA: Diagnosis not present

## 2020-01-13 DIAGNOSIS — M2351 Chronic instability of knee, right knee: Secondary | ICD-10-CM | POA: Diagnosis not present

## 2020-01-13 DIAGNOSIS — R6889 Other general symptoms and signs: Secondary | ICD-10-CM | POA: Diagnosis not present

## 2020-01-13 DIAGNOSIS — R0602 Shortness of breath: Secondary | ICD-10-CM | POA: Diagnosis not present

## 2020-01-13 DIAGNOSIS — E1165 Type 2 diabetes mellitus with hyperglycemia: Secondary | ICD-10-CM | POA: Diagnosis not present

## 2020-01-13 DIAGNOSIS — I82431 Acute embolism and thrombosis of right popliteal vein: Secondary | ICD-10-CM | POA: Diagnosis not present

## 2020-01-13 DIAGNOSIS — C652 Malignant neoplasm of left renal pelvis: Secondary | ICD-10-CM | POA: Diagnosis not present

## 2020-01-13 DIAGNOSIS — M179 Osteoarthritis of knee, unspecified: Secondary | ICD-10-CM | POA: Diagnosis not present

## 2020-01-13 DIAGNOSIS — R05 Cough: Secondary | ICD-10-CM | POA: Diagnosis not present

## 2020-01-13 DIAGNOSIS — I7101 Dissection of thoracic aorta: Secondary | ICD-10-CM | POA: Diagnosis not present

## 2020-01-13 DIAGNOSIS — E7849 Other hyperlipidemia: Secondary | ICD-10-CM | POA: Diagnosis not present

## 2020-01-13 DIAGNOSIS — N184 Chronic kidney disease, stage 4 (severe): Secondary | ICD-10-CM | POA: Diagnosis not present

## 2020-01-13 DIAGNOSIS — I70244 Atherosclerosis of native arteries of left leg with ulceration of heel and midfoot: Secondary | ICD-10-CM | POA: Diagnosis not present

## 2020-01-13 DIAGNOSIS — I129 Hypertensive chronic kidney disease with stage 1 through stage 4 chronic kidney disease, or unspecified chronic kidney disease: Secondary | ICD-10-CM | POA: Diagnosis not present

## 2020-01-13 DIAGNOSIS — L89150 Pressure ulcer of sacral region, unstageable: Secondary | ICD-10-CM | POA: Diagnosis not present

## 2020-01-13 DIAGNOSIS — I350 Nonrheumatic aortic (valve) stenosis: Secondary | ICD-10-CM | POA: Diagnosis not present

## 2020-01-13 DIAGNOSIS — M542 Cervicalgia: Secondary | ICD-10-CM | POA: Diagnosis not present

## 2020-01-13 DIAGNOSIS — B351 Tinea unguium: Secondary | ICD-10-CM | POA: Diagnosis not present

## 2020-01-13 DIAGNOSIS — I639 Cerebral infarction, unspecified: Secondary | ICD-10-CM | POA: Diagnosis not present

## 2020-01-13 DIAGNOSIS — R509 Fever, unspecified: Secondary | ICD-10-CM | POA: Diagnosis not present

## 2020-01-13 DIAGNOSIS — M545 Low back pain: Secondary | ICD-10-CM | POA: Diagnosis not present

## 2020-01-13 DIAGNOSIS — H26492 Other secondary cataract, left eye: Secondary | ICD-10-CM | POA: Diagnosis not present

## 2020-01-13 DIAGNOSIS — M1812 Unilateral primary osteoarthritis of first carpometacarpal joint, left hand: Secondary | ICD-10-CM | POA: Diagnosis not present

## 2020-01-13 DIAGNOSIS — R109 Unspecified abdominal pain: Secondary | ICD-10-CM | POA: Diagnosis not present

## 2020-01-13 DIAGNOSIS — K922 Gastrointestinal hemorrhage, unspecified: Secondary | ICD-10-CM | POA: Diagnosis not present

## 2020-01-13 DIAGNOSIS — H18413 Arcus senilis, bilateral: Secondary | ICD-10-CM | POA: Diagnosis not present

## 2020-01-13 DIAGNOSIS — C7951 Secondary malignant neoplasm of bone: Secondary | ICD-10-CM | POA: Diagnosis not present

## 2020-01-13 DIAGNOSIS — I712 Thoracic aortic aneurysm, without rupture: Secondary | ICD-10-CM | POA: Diagnosis not present

## 2020-01-13 DIAGNOSIS — J1282 Pneumonia due to coronavirus disease 2019: Secondary | ICD-10-CM | POA: Diagnosis not present

## 2020-01-13 DIAGNOSIS — E1122 Type 2 diabetes mellitus with diabetic chronic kidney disease: Secondary | ICD-10-CM | POA: Diagnosis not present

## 2020-01-13 DIAGNOSIS — Z20828 Contact with and (suspected) exposure to other viral communicable diseases: Secondary | ICD-10-CM | POA: Diagnosis not present

## 2020-01-13 DIAGNOSIS — Z950 Presence of cardiac pacemaker: Secondary | ICD-10-CM | POA: Diagnosis not present

## 2020-01-13 DIAGNOSIS — R531 Weakness: Secondary | ICD-10-CM | POA: Diagnosis not present

## 2020-01-13 DIAGNOSIS — I5033 Acute on chronic diastolic (congestive) heart failure: Secondary | ICD-10-CM | POA: Diagnosis not present

## 2020-01-13 DIAGNOSIS — Z96652 Presence of left artificial knee joint: Secondary | ICD-10-CM | POA: Diagnosis not present

## 2020-01-13 DIAGNOSIS — E0590 Thyrotoxicosis, unspecified without thyrotoxic crisis or storm: Secondary | ICD-10-CM | POA: Diagnosis not present

## 2020-01-13 DIAGNOSIS — Z794 Long term (current) use of insulin: Secondary | ICD-10-CM | POA: Diagnosis not present

## 2020-01-13 DIAGNOSIS — I2510 Atherosclerotic heart disease of native coronary artery without angina pectoris: Secondary | ICD-10-CM | POA: Diagnosis not present

## 2020-01-13 DIAGNOSIS — Z96653 Presence of artificial knee joint, bilateral: Secondary | ICD-10-CM | POA: Diagnosis not present

## 2020-01-13 DIAGNOSIS — I83015 Varicose veins of right lower extremity with ulcer other part of foot: Secondary | ICD-10-CM | POA: Diagnosis not present

## 2020-01-13 DIAGNOSIS — Z0000 Encounter for general adult medical examination without abnormal findings: Secondary | ICD-10-CM | POA: Diagnosis not present

## 2020-01-13 DIAGNOSIS — C772 Secondary and unspecified malignant neoplasm of intra-abdominal lymph nodes: Secondary | ICD-10-CM | POA: Diagnosis not present

## 2020-01-13 DIAGNOSIS — N183 Chronic kidney disease, stage 3 unspecified: Secondary | ICD-10-CM | POA: Diagnosis not present

## 2020-01-13 DIAGNOSIS — J449 Chronic obstructive pulmonary disease, unspecified: Secondary | ICD-10-CM | POA: Diagnosis not present

## 2020-01-13 DIAGNOSIS — I7300 Raynaud's syndrome without gangrene: Secondary | ICD-10-CM | POA: Diagnosis not present

## 2020-01-13 DIAGNOSIS — M7989 Other specified soft tissue disorders: Secondary | ICD-10-CM | POA: Diagnosis not present

## 2020-01-13 DIAGNOSIS — M6281 Muscle weakness (generalized): Secondary | ICD-10-CM | POA: Diagnosis not present

## 2020-01-13 DIAGNOSIS — M5136 Other intervertebral disc degeneration, lumbar region: Secondary | ICD-10-CM | POA: Diagnosis not present

## 2020-01-13 DIAGNOSIS — E1141 Type 2 diabetes mellitus with diabetic mononeuropathy: Secondary | ICD-10-CM | POA: Diagnosis not present

## 2020-01-13 DIAGNOSIS — N400 Enlarged prostate without lower urinary tract symptoms: Secondary | ICD-10-CM | POA: Diagnosis not present

## 2020-01-13 DIAGNOSIS — Z7984 Long term (current) use of oral hypoglycemic drugs: Secondary | ICD-10-CM | POA: Diagnosis not present

## 2020-01-13 DIAGNOSIS — F330 Major depressive disorder, recurrent, mild: Secondary | ICD-10-CM | POA: Diagnosis not present

## 2020-01-13 DIAGNOSIS — Z01810 Encounter for preprocedural cardiovascular examination: Secondary | ICD-10-CM | POA: Diagnosis not present

## 2020-01-13 DIAGNOSIS — N189 Chronic kidney disease, unspecified: Secondary | ICD-10-CM | POA: Diagnosis not present

## 2020-01-13 DIAGNOSIS — R262 Difficulty in walking, not elsewhere classified: Secondary | ICD-10-CM | POA: Diagnosis not present

## 2020-01-13 DIAGNOSIS — N390 Urinary tract infection, site not specified: Secondary | ICD-10-CM | POA: Diagnosis not present

## 2020-01-13 DIAGNOSIS — J90 Pleural effusion, not elsewhere classified: Secondary | ICD-10-CM | POA: Diagnosis not present

## 2020-01-13 DIAGNOSIS — J9811 Atelectasis: Secondary | ICD-10-CM | POA: Diagnosis not present

## 2020-01-13 DIAGNOSIS — H269 Unspecified cataract: Secondary | ICD-10-CM | POA: Diagnosis not present

## 2020-01-13 DIAGNOSIS — M79661 Pain in right lower leg: Secondary | ICD-10-CM | POA: Diagnosis not present

## 2020-01-13 DIAGNOSIS — I82411 Acute embolism and thrombosis of right femoral vein: Secondary | ICD-10-CM | POA: Diagnosis not present

## 2020-01-13 DIAGNOSIS — N179 Acute kidney failure, unspecified: Secondary | ICD-10-CM | POA: Diagnosis not present

## 2020-01-13 DIAGNOSIS — F09 Unspecified mental disorder due to known physiological condition: Secondary | ICD-10-CM | POA: Diagnosis not present

## 2020-01-13 DIAGNOSIS — I5031 Acute diastolic (congestive) heart failure: Secondary | ICD-10-CM | POA: Diagnosis not present

## 2020-01-13 DIAGNOSIS — J9621 Acute and chronic respiratory failure with hypoxia: Secondary | ICD-10-CM | POA: Diagnosis not present

## 2020-01-13 DIAGNOSIS — M170 Bilateral primary osteoarthritis of knee: Secondary | ICD-10-CM | POA: Diagnosis not present

## 2020-01-13 DIAGNOSIS — R921 Mammographic calcification found on diagnostic imaging of breast: Secondary | ICD-10-CM | POA: Diagnosis not present

## 2020-01-13 DIAGNOSIS — E119 Type 2 diabetes mellitus without complications: Secondary | ICD-10-CM | POA: Diagnosis not present

## 2020-01-13 DIAGNOSIS — I70234 Atherosclerosis of native arteries of right leg with ulceration of heel and midfoot: Secondary | ICD-10-CM | POA: Diagnosis not present

## 2020-01-13 DIAGNOSIS — R0600 Dyspnea, unspecified: Secondary | ICD-10-CM | POA: Diagnosis not present

## 2020-01-13 DIAGNOSIS — M4857XS Collapsed vertebra, not elsewhere classified, lumbosacral region, sequela of fracture: Secondary | ICD-10-CM | POA: Diagnosis not present

## 2020-01-13 DIAGNOSIS — J9311 Primary spontaneous pneumothorax: Secondary | ICD-10-CM | POA: Diagnosis not present

## 2020-01-13 DIAGNOSIS — L89210 Pressure ulcer of right hip, unstageable: Secondary | ICD-10-CM | POA: Diagnosis not present

## 2020-01-13 DIAGNOSIS — G459 Transient cerebral ischemic attack, unspecified: Secondary | ICD-10-CM | POA: Diagnosis not present

## 2020-01-13 DIAGNOSIS — J9601 Acute respiratory failure with hypoxia: Secondary | ICD-10-CM | POA: Diagnosis not present

## 2020-01-13 DIAGNOSIS — Z006 Encounter for examination for normal comparison and control in clinical research program: Secondary | ICD-10-CM | POA: Diagnosis not present

## 2020-01-13 DIAGNOSIS — M79676 Pain in unspecified toe(s): Secondary | ICD-10-CM | POA: Diagnosis not present

## 2020-01-13 DIAGNOSIS — Z961 Presence of intraocular lens: Secondary | ICD-10-CM | POA: Diagnosis not present

## 2020-01-13 DIAGNOSIS — J189 Pneumonia, unspecified organism: Secondary | ICD-10-CM | POA: Diagnosis not present

## 2020-01-13 DIAGNOSIS — M5442 Lumbago with sciatica, left side: Secondary | ICD-10-CM | POA: Diagnosis not present

## 2020-01-13 DIAGNOSIS — F4321 Adjustment disorder with depressed mood: Secondary | ICD-10-CM | POA: Diagnosis not present

## 2020-01-13 DIAGNOSIS — R4182 Altered mental status, unspecified: Secondary | ICD-10-CM | POA: Diagnosis not present

## 2020-01-13 DIAGNOSIS — M79641 Pain in right hand: Secondary | ICD-10-CM | POA: Diagnosis not present

## 2020-01-13 DIAGNOSIS — Z96651 Presence of right artificial knee joint: Secondary | ICD-10-CM | POA: Diagnosis not present

## 2020-01-13 DIAGNOSIS — F17210 Nicotine dependence, cigarettes, uncomplicated: Secondary | ICD-10-CM | POA: Diagnosis not present

## 2020-01-13 DIAGNOSIS — I4430 Unspecified atrioventricular block: Secondary | ICD-10-CM | POA: Diagnosis not present

## 2020-01-13 DIAGNOSIS — D649 Anemia, unspecified: Secondary | ICD-10-CM | POA: Diagnosis not present

## 2020-01-13 DIAGNOSIS — I959 Hypotension, unspecified: Secondary | ICD-10-CM | POA: Diagnosis not present

## 2020-01-13 DIAGNOSIS — N35911 Unspecified urethral stricture, male, meatal: Secondary | ICD-10-CM | POA: Diagnosis not present

## 2020-01-13 DIAGNOSIS — H25813 Combined forms of age-related cataract, bilateral: Secondary | ICD-10-CM | POA: Diagnosis not present

## 2020-01-13 DIAGNOSIS — C8332 Diffuse large B-cell lymphoma, intrathoracic lymph nodes: Secondary | ICD-10-CM | POA: Diagnosis not present

## 2020-01-13 DIAGNOSIS — E782 Mixed hyperlipidemia: Secondary | ICD-10-CM | POA: Diagnosis not present

## 2020-01-13 DIAGNOSIS — M069 Rheumatoid arthritis, unspecified: Secondary | ICD-10-CM | POA: Diagnosis not present

## 2020-01-13 DIAGNOSIS — I130 Hypertensive heart and chronic kidney disease with heart failure and stage 1 through stage 4 chronic kidney disease, or unspecified chronic kidney disease: Secondary | ICD-10-CM | POA: Diagnosis not present

## 2020-01-13 DIAGNOSIS — E039 Hypothyroidism, unspecified: Secondary | ICD-10-CM | POA: Diagnosis not present

## 2020-01-13 DIAGNOSIS — G4733 Obstructive sleep apnea (adult) (pediatric): Secondary | ICD-10-CM | POA: Diagnosis not present

## 2020-01-13 DIAGNOSIS — I2699 Other pulmonary embolism without acute cor pulmonale: Secondary | ICD-10-CM | POA: Diagnosis not present

## 2020-01-13 DIAGNOSIS — F324 Major depressive disorder, single episode, in partial remission: Secondary | ICD-10-CM | POA: Diagnosis not present

## 2020-01-13 DIAGNOSIS — M9903 Segmental and somatic dysfunction of lumbar region: Secondary | ICD-10-CM | POA: Diagnosis not present

## 2020-01-13 DIAGNOSIS — E876 Hypokalemia: Secondary | ICD-10-CM | POA: Diagnosis not present

## 2020-01-13 DIAGNOSIS — R768 Other specified abnormal immunological findings in serum: Secondary | ICD-10-CM | POA: Diagnosis not present

## 2020-01-13 DIAGNOSIS — M5137 Other intervertebral disc degeneration, lumbosacral region: Secondary | ICD-10-CM | POA: Diagnosis not present

## 2020-01-13 DIAGNOSIS — M9904 Segmental and somatic dysfunction of sacral region: Secondary | ICD-10-CM | POA: Diagnosis not present

## 2020-01-13 DIAGNOSIS — I4891 Unspecified atrial fibrillation: Secondary | ICD-10-CM | POA: Diagnosis not present

## 2020-01-13 DIAGNOSIS — I82421 Acute embolism and thrombosis of right iliac vein: Secondary | ICD-10-CM | POA: Diagnosis not present

## 2020-01-13 DIAGNOSIS — D8989 Other specified disorders involving the immune mechanism, not elsewhere classified: Secondary | ICD-10-CM | POA: Diagnosis not present

## 2020-01-13 DIAGNOSIS — Z9989 Dependence on other enabling machines and devices: Secondary | ICD-10-CM | POA: Diagnosis not present

## 2020-01-13 DIAGNOSIS — U071 COVID-19: Secondary | ICD-10-CM | POA: Diagnosis not present

## 2020-01-13 DIAGNOSIS — I10 Essential (primary) hypertension: Secondary | ICD-10-CM | POA: Diagnosis not present

## 2020-01-13 DIAGNOSIS — L89890 Pressure ulcer of other site, unstageable: Secondary | ICD-10-CM | POA: Diagnosis not present

## 2020-01-13 DIAGNOSIS — Z471 Aftercare following joint replacement surgery: Secondary | ICD-10-CM | POA: Diagnosis not present

## 2020-01-13 DIAGNOSIS — I83018 Varicose veins of right lower extremity with ulcer other part of lower leg: Secondary | ICD-10-CM | POA: Diagnosis not present

## 2020-01-13 DIAGNOSIS — E11319 Type 2 diabetes mellitus with unspecified diabetic retinopathy without macular edema: Secondary | ICD-10-CM | POA: Diagnosis not present

## 2020-01-14 DIAGNOSIS — M170 Bilateral primary osteoarthritis of knee: Secondary | ICD-10-CM | POA: Diagnosis not present

## 2020-01-14 DIAGNOSIS — Z9181 History of falling: Secondary | ICD-10-CM | POA: Diagnosis not present

## 2020-01-14 DIAGNOSIS — J3081 Allergic rhinitis due to animal (cat) (dog) hair and dander: Secondary | ICD-10-CM | POA: Diagnosis not present

## 2020-01-14 DIAGNOSIS — F3342 Major depressive disorder, recurrent, in full remission: Secondary | ICD-10-CM | POA: Diagnosis not present

## 2020-01-14 DIAGNOSIS — Z993 Dependence on wheelchair: Secondary | ICD-10-CM | POA: Diagnosis not present

## 2020-01-14 DIAGNOSIS — M1990 Unspecified osteoarthritis, unspecified site: Secondary | ICD-10-CM | POA: Diagnosis not present

## 2020-01-14 DIAGNOSIS — E668 Other obesity: Secondary | ICD-10-CM | POA: Diagnosis not present

## 2020-01-14 DIAGNOSIS — S82112D Displaced fracture of left tibial spine, subsequent encounter for closed fracture with routine healing: Secondary | ICD-10-CM | POA: Diagnosis not present

## 2020-01-14 DIAGNOSIS — M19041 Primary osteoarthritis, right hand: Secondary | ICD-10-CM | POA: Diagnosis not present

## 2020-01-14 DIAGNOSIS — C61 Malignant neoplasm of prostate: Secondary | ICD-10-CM | POA: Diagnosis not present

## 2020-01-14 DIAGNOSIS — Z79891 Long term (current) use of opiate analgesic: Secondary | ICD-10-CM | POA: Diagnosis not present

## 2020-01-14 DIAGNOSIS — G629 Polyneuropathy, unspecified: Secondary | ICD-10-CM | POA: Diagnosis not present

## 2020-01-14 DIAGNOSIS — J1282 Pneumonia due to coronavirus disease 2019: Secondary | ICD-10-CM | POA: Diagnosis not present

## 2020-01-14 DIAGNOSIS — I110 Hypertensive heart disease with heart failure: Secondary | ICD-10-CM | POA: Diagnosis not present

## 2020-01-14 DIAGNOSIS — N390 Urinary tract infection, site not specified: Secondary | ICD-10-CM | POA: Diagnosis not present

## 2020-01-14 DIAGNOSIS — M545 Low back pain: Secondary | ICD-10-CM | POA: Diagnosis not present

## 2020-01-14 DIAGNOSIS — K449 Diaphragmatic hernia without obstruction or gangrene: Secondary | ICD-10-CM | POA: Diagnosis not present

## 2020-01-14 DIAGNOSIS — R2689 Other abnormalities of gait and mobility: Secondary | ICD-10-CM | POA: Diagnosis not present

## 2020-01-14 DIAGNOSIS — M810 Age-related osteoporosis without current pathological fracture: Secondary | ICD-10-CM | POA: Diagnosis not present

## 2020-01-14 DIAGNOSIS — Z9861 Coronary angioplasty status: Secondary | ICD-10-CM | POA: Diagnosis not present

## 2020-01-14 DIAGNOSIS — G8929 Other chronic pain: Secondary | ICD-10-CM | POA: Diagnosis not present

## 2020-01-14 DIAGNOSIS — R4189 Other symptoms and signs involving cognitive functions and awareness: Secondary | ICD-10-CM | POA: Diagnosis not present

## 2020-01-14 DIAGNOSIS — Z85841 Personal history of malignant neoplasm of brain: Secondary | ICD-10-CM | POA: Diagnosis not present

## 2020-01-14 DIAGNOSIS — E440 Moderate protein-calorie malnutrition: Secondary | ICD-10-CM | POA: Diagnosis not present

## 2020-01-14 DIAGNOSIS — F329 Major depressive disorder, single episode, unspecified: Secondary | ICD-10-CM | POA: Diagnosis not present

## 2020-01-14 DIAGNOSIS — F5102 Adjustment insomnia: Secondary | ICD-10-CM | POA: Diagnosis not present

## 2020-01-14 DIAGNOSIS — Z7982 Long term (current) use of aspirin: Secondary | ICD-10-CM | POA: Diagnosis not present

## 2020-01-14 DIAGNOSIS — I5033 Acute on chronic diastolic (congestive) heart failure: Secondary | ICD-10-CM | POA: Diagnosis not present

## 2020-01-14 DIAGNOSIS — I160 Hypertensive urgency: Secondary | ICD-10-CM | POA: Diagnosis not present

## 2020-01-14 DIAGNOSIS — I712 Thoracic aortic aneurysm, without rupture: Secondary | ICD-10-CM | POA: Diagnosis not present

## 2020-01-14 DIAGNOSIS — I5022 Chronic systolic (congestive) heart failure: Secondary | ICD-10-CM | POA: Diagnosis not present

## 2020-01-14 DIAGNOSIS — Z452 Encounter for adjustment and management of vascular access device: Secondary | ICD-10-CM | POA: Diagnosis not present

## 2020-01-14 DIAGNOSIS — I10 Essential (primary) hypertension: Secondary | ICD-10-CM | POA: Diagnosis not present

## 2020-01-14 DIAGNOSIS — R2681 Unsteadiness on feet: Secondary | ICD-10-CM | POA: Diagnosis not present

## 2020-01-14 DIAGNOSIS — N879 Dysplasia of cervix uteri, unspecified: Secondary | ICD-10-CM | POA: Diagnosis not present

## 2020-01-14 DIAGNOSIS — R278 Other lack of coordination: Secondary | ICD-10-CM | POA: Diagnosis not present

## 2020-01-14 DIAGNOSIS — I69398 Other sequelae of cerebral infarction: Secondary | ICD-10-CM | POA: Diagnosis not present

## 2020-01-14 DIAGNOSIS — M1611 Unilateral primary osteoarthritis, right hip: Secondary | ICD-10-CM | POA: Diagnosis not present

## 2020-01-14 DIAGNOSIS — K5900 Constipation, unspecified: Secondary | ICD-10-CM | POA: Diagnosis not present

## 2020-01-14 DIAGNOSIS — E6609 Other obesity due to excess calories: Secondary | ICD-10-CM | POA: Diagnosis not present

## 2020-01-14 DIAGNOSIS — Z1159 Encounter for screening for other viral diseases: Secondary | ICD-10-CM | POA: Diagnosis not present

## 2020-01-14 DIAGNOSIS — Z7901 Long term (current) use of anticoagulants: Secondary | ICD-10-CM | POA: Diagnosis not present

## 2020-01-14 DIAGNOSIS — I129 Hypertensive chronic kidney disease with stage 1 through stage 4 chronic kidney disease, or unspecified chronic kidney disease: Secondary | ICD-10-CM | POA: Diagnosis not present

## 2020-01-14 DIAGNOSIS — Z820 Family history of epilepsy and other diseases of the nervous system: Secondary | ICD-10-CM | POA: Diagnosis not present

## 2020-01-14 DIAGNOSIS — K254 Chronic or unspecified gastric ulcer with hemorrhage: Secondary | ICD-10-CM | POA: Diagnosis not present

## 2020-01-14 DIAGNOSIS — Z86010 Personal history of colonic polyps: Secondary | ICD-10-CM | POA: Diagnosis not present

## 2020-01-14 DIAGNOSIS — R079 Chest pain, unspecified: Secondary | ICD-10-CM | POA: Diagnosis not present

## 2020-01-14 DIAGNOSIS — I130 Hypertensive heart and chronic kidney disease with heart failure and stage 1 through stage 4 chronic kidney disease, or unspecified chronic kidney disease: Secondary | ICD-10-CM | POA: Diagnosis not present

## 2020-01-14 DIAGNOSIS — R0602 Shortness of breath: Secondary | ICD-10-CM | POA: Diagnosis not present

## 2020-01-14 DIAGNOSIS — M50323 Other cervical disc degeneration at C6-C7 level: Secondary | ICD-10-CM | POA: Diagnosis not present

## 2020-01-14 DIAGNOSIS — K5730 Diverticulosis of large intestine without perforation or abscess without bleeding: Secondary | ICD-10-CM | POA: Diagnosis not present

## 2020-01-14 DIAGNOSIS — M5136 Other intervertebral disc degeneration, lumbar region: Secondary | ICD-10-CM | POA: Diagnosis not present

## 2020-01-14 DIAGNOSIS — M5137 Other intervertebral disc degeneration, lumbosacral region: Secondary | ICD-10-CM | POA: Diagnosis not present

## 2020-01-14 DIAGNOSIS — C719 Malignant neoplasm of brain, unspecified: Secondary | ICD-10-CM | POA: Diagnosis not present

## 2020-01-14 DIAGNOSIS — M791 Myalgia, unspecified site: Secondary | ICD-10-CM | POA: Diagnosis not present

## 2020-01-14 DIAGNOSIS — F325 Major depressive disorder, single episode, in full remission: Secondary | ICD-10-CM | POA: Diagnosis not present

## 2020-01-14 DIAGNOSIS — G9341 Metabolic encephalopathy: Secondary | ICD-10-CM | POA: Diagnosis not present

## 2020-01-14 DIAGNOSIS — N400 Enlarged prostate without lower urinary tract symptoms: Secondary | ICD-10-CM | POA: Diagnosis not present

## 2020-01-14 DIAGNOSIS — N800 Endometriosis of uterus: Secondary | ICD-10-CM | POA: Diagnosis not present

## 2020-01-14 DIAGNOSIS — M5416 Radiculopathy, lumbar region: Secondary | ICD-10-CM | POA: Diagnosis not present

## 2020-01-14 DIAGNOSIS — B9562 Methicillin resistant Staphylococcus aureus infection as the cause of diseases classified elsewhere: Secondary | ICD-10-CM | POA: Diagnosis not present

## 2020-01-14 DIAGNOSIS — J301 Allergic rhinitis due to pollen: Secondary | ICD-10-CM | POA: Diagnosis not present

## 2020-01-14 DIAGNOSIS — M9902 Segmental and somatic dysfunction of thoracic region: Secondary | ICD-10-CM | POA: Diagnosis not present

## 2020-01-14 DIAGNOSIS — M955 Acquired deformity of pelvis: Secondary | ICD-10-CM | POA: Diagnosis not present

## 2020-01-14 DIAGNOSIS — M9905 Segmental and somatic dysfunction of pelvic region: Secondary | ICD-10-CM | POA: Diagnosis not present

## 2020-01-14 DIAGNOSIS — Z9862 Peripheral vascular angioplasty status: Secondary | ICD-10-CM | POA: Diagnosis not present

## 2020-01-14 DIAGNOSIS — D62 Acute posthemorrhagic anemia: Secondary | ICD-10-CM | POA: Diagnosis not present

## 2020-01-14 DIAGNOSIS — J40 Bronchitis, not specified as acute or chronic: Secondary | ICD-10-CM | POA: Diagnosis not present

## 2020-01-14 DIAGNOSIS — E7849 Other hyperlipidemia: Secondary | ICD-10-CM | POA: Diagnosis not present

## 2020-01-14 DIAGNOSIS — S52125D Nondisplaced fracture of head of left radius, subsequent encounter for closed fracture with routine healing: Secondary | ICD-10-CM | POA: Diagnosis not present

## 2020-01-14 DIAGNOSIS — Z7984 Long term (current) use of oral hypoglycemic drugs: Secondary | ICD-10-CM | POA: Diagnosis not present

## 2020-01-14 DIAGNOSIS — N993 Prolapse of vaginal vault after hysterectomy: Secondary | ICD-10-CM | POA: Diagnosis not present

## 2020-01-14 DIAGNOSIS — N813 Complete uterovaginal prolapse: Secondary | ICD-10-CM | POA: Diagnosis not present

## 2020-01-14 DIAGNOSIS — Z743 Need for continuous supervision: Secondary | ICD-10-CM | POA: Diagnosis not present

## 2020-01-14 DIAGNOSIS — E785 Hyperlipidemia, unspecified: Secondary | ICD-10-CM | POA: Diagnosis not present

## 2020-01-14 DIAGNOSIS — M9904 Segmental and somatic dysfunction of sacral region: Secondary | ICD-10-CM | POA: Diagnosis not present

## 2020-01-14 DIAGNOSIS — M5413 Radiculopathy, cervicothoracic region: Secondary | ICD-10-CM | POA: Diagnosis not present

## 2020-01-14 DIAGNOSIS — R4702 Dysphasia: Secondary | ICD-10-CM | POA: Diagnosis not present

## 2020-01-14 DIAGNOSIS — Z8616 Personal history of COVID-19: Secondary | ICD-10-CM | POA: Diagnosis not present

## 2020-01-14 DIAGNOSIS — E119 Type 2 diabetes mellitus without complications: Secondary | ICD-10-CM | POA: Diagnosis not present

## 2020-01-14 DIAGNOSIS — B955 Unspecified streptococcus as the cause of diseases classified elsewhere: Secondary | ICD-10-CM | POA: Diagnosis not present

## 2020-01-14 DIAGNOSIS — Z8249 Family history of ischemic heart disease and other diseases of the circulatory system: Secondary | ICD-10-CM | POA: Diagnosis not present

## 2020-01-14 DIAGNOSIS — E039 Hypothyroidism, unspecified: Secondary | ICD-10-CM | POA: Diagnosis not present

## 2020-01-14 DIAGNOSIS — R1319 Other dysphagia: Secondary | ICD-10-CM | POA: Diagnosis not present

## 2020-01-14 DIAGNOSIS — K635 Polyp of colon: Secondary | ICD-10-CM | POA: Diagnosis not present

## 2020-01-14 DIAGNOSIS — N401 Enlarged prostate with lower urinary tract symptoms: Secondary | ICD-10-CM | POA: Diagnosis not present

## 2020-01-14 DIAGNOSIS — I2510 Atherosclerotic heart disease of native coronary artery without angina pectoris: Secondary | ICD-10-CM | POA: Diagnosis not present

## 2020-01-14 DIAGNOSIS — M6281 Muscle weakness (generalized): Secondary | ICD-10-CM | POA: Diagnosis not present

## 2020-01-14 DIAGNOSIS — J45909 Unspecified asthma, uncomplicated: Secondary | ICD-10-CM | POA: Diagnosis not present

## 2020-01-14 DIAGNOSIS — K319 Disease of stomach and duodenum, unspecified: Secondary | ICD-10-CM | POA: Diagnosis not present

## 2020-01-14 DIAGNOSIS — R5383 Other fatigue: Secondary | ICD-10-CM | POA: Diagnosis not present

## 2020-01-14 DIAGNOSIS — I70242 Atherosclerosis of native arteries of left leg with ulceration of calf: Secondary | ICD-10-CM | POA: Diagnosis not present

## 2020-01-14 DIAGNOSIS — Z9889 Other specified postprocedural states: Secondary | ICD-10-CM | POA: Diagnosis not present

## 2020-01-14 DIAGNOSIS — N179 Acute kidney failure, unspecified: Secondary | ICD-10-CM | POA: Diagnosis not present

## 2020-01-14 DIAGNOSIS — M546 Pain in thoracic spine: Secondary | ICD-10-CM | POA: Diagnosis not present

## 2020-01-14 DIAGNOSIS — E876 Hypokalemia: Secondary | ICD-10-CM | POA: Diagnosis not present

## 2020-01-14 DIAGNOSIS — M62830 Muscle spasm of back: Secondary | ICD-10-CM | POA: Diagnosis not present

## 2020-01-14 DIAGNOSIS — Z941 Heart transplant status: Secondary | ICD-10-CM | POA: Diagnosis not present

## 2020-01-14 DIAGNOSIS — Z7902 Long term (current) use of antithrombotics/antiplatelets: Secondary | ICD-10-CM | POA: Diagnosis not present

## 2020-01-14 DIAGNOSIS — E1140 Type 2 diabetes mellitus with diabetic neuropathy, unspecified: Secondary | ICD-10-CM | POA: Diagnosis not present

## 2020-01-14 DIAGNOSIS — M47816 Spondylosis without myelopathy or radiculopathy, lumbar region: Secondary | ICD-10-CM | POA: Diagnosis not present

## 2020-01-14 DIAGNOSIS — E1122 Type 2 diabetes mellitus with diabetic chronic kidney disease: Secondary | ICD-10-CM | POA: Diagnosis not present

## 2020-01-14 DIAGNOSIS — E1151 Type 2 diabetes mellitus with diabetic peripheral angiopathy without gangrene: Secondary | ICD-10-CM | POA: Diagnosis not present

## 2020-01-14 DIAGNOSIS — R0789 Other chest pain: Secondary | ICD-10-CM | POA: Diagnosis not present

## 2020-01-14 DIAGNOSIS — Z466 Encounter for fitting and adjustment of urinary device: Secondary | ICD-10-CM | POA: Diagnosis not present

## 2020-01-14 DIAGNOSIS — F341 Dysthymic disorder: Secondary | ICD-10-CM | POA: Diagnosis not present

## 2020-01-14 DIAGNOSIS — Z66 Do not resuscitate: Secondary | ICD-10-CM | POA: Diagnosis not present

## 2020-01-14 DIAGNOSIS — D045 Carcinoma in situ of skin of trunk: Secondary | ICD-10-CM | POA: Diagnosis not present

## 2020-01-14 DIAGNOSIS — I480 Paroxysmal atrial fibrillation: Secondary | ICD-10-CM | POA: Diagnosis not present

## 2020-01-14 DIAGNOSIS — C50911 Malignant neoplasm of unspecified site of right female breast: Secondary | ICD-10-CM | POA: Diagnosis not present

## 2020-01-14 DIAGNOSIS — S065X0D Traumatic subdural hemorrhage without loss of consciousness, subsequent encounter: Secondary | ICD-10-CM | POA: Diagnosis not present

## 2020-01-14 DIAGNOSIS — Z006 Encounter for examination for normal comparison and control in clinical research program: Secondary | ICD-10-CM | POA: Diagnosis not present

## 2020-01-14 DIAGNOSIS — H54415A Blindness right eye category 5, normal vision left eye: Secondary | ICD-10-CM | POA: Diagnosis not present

## 2020-01-14 DIAGNOSIS — J3089 Other allergic rhinitis: Secondary | ICD-10-CM | POA: Diagnosis not present

## 2020-01-14 DIAGNOSIS — R1313 Dysphagia, pharyngeal phase: Secondary | ICD-10-CM | POA: Diagnosis not present

## 2020-01-14 DIAGNOSIS — F0390 Unspecified dementia without behavioral disturbance: Secondary | ICD-10-CM | POA: Diagnosis not present

## 2020-01-14 DIAGNOSIS — A400 Sepsis due to streptococcus, group A: Secondary | ICD-10-CM | POA: Diagnosis not present

## 2020-01-14 DIAGNOSIS — Q87410 Marfan's syndrome with aortic dilation: Secondary | ICD-10-CM | POA: Diagnosis not present

## 2020-01-14 DIAGNOSIS — I4891 Unspecified atrial fibrillation: Secondary | ICD-10-CM | POA: Diagnosis not present

## 2020-01-14 DIAGNOSIS — R001 Bradycardia, unspecified: Secondary | ICD-10-CM | POA: Diagnosis not present

## 2020-01-14 DIAGNOSIS — L97221 Non-pressure chronic ulcer of left calf limited to breakdown of skin: Secondary | ICD-10-CM | POA: Diagnosis not present

## 2020-01-14 DIAGNOSIS — N323 Diverticulum of bladder: Secondary | ICD-10-CM | POA: Diagnosis not present

## 2020-01-14 DIAGNOSIS — Z6835 Body mass index (BMI) 35.0-35.9, adult: Secondary | ICD-10-CM | POA: Diagnosis not present

## 2020-01-14 DIAGNOSIS — Z23 Encounter for immunization: Secondary | ICD-10-CM | POA: Diagnosis not present

## 2020-01-14 DIAGNOSIS — R64 Cachexia: Secondary | ICD-10-CM | POA: Diagnosis not present

## 2020-01-14 DIAGNOSIS — E7801 Familial hypercholesterolemia: Secondary | ICD-10-CM | POA: Diagnosis not present

## 2020-01-14 DIAGNOSIS — M1711 Unilateral primary osteoarthritis, right knee: Secondary | ICD-10-CM | POA: Diagnosis not present

## 2020-01-14 DIAGNOSIS — I998 Other disorder of circulatory system: Secondary | ICD-10-CM | POA: Diagnosis not present

## 2020-01-14 DIAGNOSIS — T39395A Adverse effect of other nonsteroidal anti-inflammatory drugs [NSAID], initial encounter: Secondary | ICD-10-CM | POA: Diagnosis not present

## 2020-01-14 DIAGNOSIS — J019 Acute sinusitis, unspecified: Secondary | ICD-10-CM | POA: Diagnosis not present

## 2020-01-14 DIAGNOSIS — G4700 Insomnia, unspecified: Secondary | ICD-10-CM | POA: Diagnosis not present

## 2020-01-14 DIAGNOSIS — E1165 Type 2 diabetes mellitus with hyperglycemia: Secondary | ICD-10-CM | POA: Diagnosis not present

## 2020-01-14 DIAGNOSIS — K769 Liver disease, unspecified: Secondary | ICD-10-CM | POA: Diagnosis not present

## 2020-01-14 DIAGNOSIS — E1142 Type 2 diabetes mellitus with diabetic polyneuropathy: Secondary | ICD-10-CM | POA: Diagnosis not present

## 2020-01-14 DIAGNOSIS — D638 Anemia in other chronic diseases classified elsewhere: Secondary | ICD-10-CM | POA: Diagnosis not present

## 2020-01-14 DIAGNOSIS — Z87891 Personal history of nicotine dependence: Secondary | ICD-10-CM | POA: Diagnosis not present

## 2020-01-14 DIAGNOSIS — N183 Chronic kidney disease, stage 3 unspecified: Secondary | ICD-10-CM | POA: Diagnosis not present

## 2020-01-14 DIAGNOSIS — Z96641 Presence of right artificial hip joint: Secondary | ICD-10-CM | POA: Diagnosis not present

## 2020-01-14 DIAGNOSIS — D696 Thrombocytopenia, unspecified: Secondary | ICD-10-CM | POA: Diagnosis not present

## 2020-01-14 DIAGNOSIS — E782 Mixed hyperlipidemia: Secondary | ICD-10-CM | POA: Diagnosis not present

## 2020-01-14 DIAGNOSIS — F332 Major depressive disorder, recurrent severe without psychotic features: Secondary | ICD-10-CM | POA: Diagnosis not present

## 2020-01-14 DIAGNOSIS — J9311 Primary spontaneous pneumothorax: Secondary | ICD-10-CM | POA: Diagnosis not present

## 2020-01-14 DIAGNOSIS — M159 Polyosteoarthritis, unspecified: Secondary | ICD-10-CM | POA: Diagnosis not present

## 2020-01-14 DIAGNOSIS — I050 Rheumatic mitral stenosis: Secondary | ICD-10-CM | POA: Diagnosis not present

## 2020-01-14 DIAGNOSIS — U071 COVID-19: Secondary | ICD-10-CM | POA: Diagnosis not present

## 2020-01-14 DIAGNOSIS — K122 Cellulitis and abscess of mouth: Secondary | ICD-10-CM | POA: Diagnosis not present

## 2020-01-14 DIAGNOSIS — E1129 Type 2 diabetes mellitus with other diabetic kidney complication: Secondary | ICD-10-CM | POA: Diagnosis not present

## 2020-01-14 DIAGNOSIS — J90 Pleural effusion, not elsewhere classified: Secondary | ICD-10-CM | POA: Diagnosis not present

## 2020-01-14 DIAGNOSIS — M25552 Pain in left hip: Secondary | ICD-10-CM | POA: Diagnosis not present

## 2020-01-14 DIAGNOSIS — M9903 Segmental and somatic dysfunction of lumbar region: Secondary | ICD-10-CM | POA: Diagnosis not present

## 2020-01-14 DIAGNOSIS — M8580 Other specified disorders of bone density and structure, unspecified site: Secondary | ICD-10-CM | POA: Diagnosis not present

## 2020-01-14 DIAGNOSIS — N210 Calculus in bladder: Secondary | ICD-10-CM | POA: Diagnosis not present

## 2020-01-14 DIAGNOSIS — Z5181 Encounter for therapeutic drug level monitoring: Secondary | ICD-10-CM | POA: Diagnosis not present

## 2020-01-14 DIAGNOSIS — L988 Other specified disorders of the skin and subcutaneous tissue: Secondary | ICD-10-CM | POA: Diagnosis not present

## 2020-01-14 DIAGNOSIS — E441 Mild protein-calorie malnutrition: Secondary | ICD-10-CM | POA: Diagnosis not present

## 2020-01-14 DIAGNOSIS — N816 Rectocele: Secondary | ICD-10-CM | POA: Diagnosis not present

## 2020-01-14 DIAGNOSIS — I482 Chronic atrial fibrillation, unspecified: Secondary | ICD-10-CM | POA: Diagnosis not present

## 2020-01-14 DIAGNOSIS — I82409 Acute embolism and thrombosis of unspecified deep veins of unspecified lower extremity: Secondary | ICD-10-CM | POA: Diagnosis not present

## 2020-01-14 DIAGNOSIS — Z1389 Encounter for screening for other disorder: Secondary | ICD-10-CM | POA: Diagnosis not present

## 2020-01-14 DIAGNOSIS — Z736 Limitation of activities due to disability: Secondary | ICD-10-CM | POA: Diagnosis not present

## 2020-01-14 DIAGNOSIS — D509 Iron deficiency anemia, unspecified: Secondary | ICD-10-CM | POA: Diagnosis not present

## 2020-01-14 DIAGNOSIS — J449 Chronic obstructive pulmonary disease, unspecified: Secondary | ICD-10-CM | POA: Diagnosis not present

## 2020-01-14 DIAGNOSIS — G936 Cerebral edema: Secondary | ICD-10-CM | POA: Diagnosis not present

## 2020-01-14 DIAGNOSIS — Z9012 Acquired absence of left breast and nipple: Secondary | ICD-10-CM | POA: Diagnosis not present

## 2020-01-14 DIAGNOSIS — R972 Elevated prostate specific antigen [PSA]: Secondary | ICD-10-CM | POA: Diagnosis not present

## 2020-01-14 DIAGNOSIS — E7800 Pure hypercholesterolemia, unspecified: Secondary | ICD-10-CM | POA: Diagnosis not present

## 2020-01-14 DIAGNOSIS — J9601 Acute respiratory failure with hypoxia: Secondary | ICD-10-CM | POA: Diagnosis not present

## 2020-01-14 DIAGNOSIS — Z681 Body mass index (BMI) 19 or less, adult: Secondary | ICD-10-CM | POA: Diagnosis not present

## 2020-01-14 DIAGNOSIS — E8809 Other disorders of plasma-protein metabolism, not elsewhere classified: Secondary | ICD-10-CM | POA: Diagnosis not present

## 2020-01-14 DIAGNOSIS — N159 Renal tubulo-interstitial disease, unspecified: Secondary | ICD-10-CM | POA: Diagnosis not present

## 2020-01-14 DIAGNOSIS — D649 Anemia, unspecified: Secondary | ICD-10-CM | POA: Diagnosis not present

## 2020-01-14 DIAGNOSIS — Z9981 Dependence on supplemental oxygen: Secondary | ICD-10-CM | POA: Diagnosis not present

## 2020-01-14 DIAGNOSIS — Z954 Presence of other heart-valve replacement: Secondary | ICD-10-CM | POA: Diagnosis not present

## 2020-01-14 DIAGNOSIS — G3184 Mild cognitive impairment, so stated: Secondary | ICD-10-CM | POA: Diagnosis not present

## 2020-01-14 DIAGNOSIS — R05 Cough: Secondary | ICD-10-CM | POA: Diagnosis not present

## 2020-01-14 DIAGNOSIS — M9901 Segmental and somatic dysfunction of cervical region: Secondary | ICD-10-CM | POA: Diagnosis not present

## 2020-01-14 DIAGNOSIS — S7292XD Unspecified fracture of left femur, subsequent encounter for closed fracture with routine healing: Secondary | ICD-10-CM | POA: Diagnosis not present

## 2020-01-14 DIAGNOSIS — E46 Unspecified protein-calorie malnutrition: Secondary | ICD-10-CM | POA: Diagnosis not present

## 2020-01-14 DIAGNOSIS — M4626 Osteomyelitis of vertebra, lumbar region: Secondary | ICD-10-CM | POA: Diagnosis not present

## 2020-01-14 DIAGNOSIS — C50919 Malignant neoplasm of unspecified site of unspecified female breast: Secondary | ICD-10-CM | POA: Diagnosis not present

## 2020-01-14 DIAGNOSIS — Z48811 Encounter for surgical aftercare following surgery on the nervous system: Secondary | ICD-10-CM | POA: Diagnosis not present

## 2020-01-14 DIAGNOSIS — Z79899 Other long term (current) drug therapy: Secondary | ICD-10-CM | POA: Diagnosis not present

## 2020-01-14 DIAGNOSIS — Z0000 Encounter for general adult medical examination without abnormal findings: Secondary | ICD-10-CM | POA: Diagnosis not present

## 2020-01-14 DIAGNOSIS — M5441 Lumbago with sciatica, right side: Secondary | ICD-10-CM | POA: Diagnosis not present

## 2020-01-14 DIAGNOSIS — I5032 Chronic diastolic (congestive) heart failure: Secondary | ICD-10-CM | POA: Diagnosis not present

## 2020-01-14 DIAGNOSIS — E1169 Type 2 diabetes mellitus with other specified complication: Secondary | ICD-10-CM | POA: Diagnosis not present

## 2020-01-14 DIAGNOSIS — E538 Deficiency of other specified B group vitamins: Secondary | ICD-10-CM | POA: Diagnosis not present

## 2020-01-14 DIAGNOSIS — G061 Intraspinal abscess and granuloma: Secondary | ICD-10-CM | POA: Diagnosis not present

## 2020-01-14 DIAGNOSIS — R1310 Dysphagia, unspecified: Secondary | ICD-10-CM | POA: Diagnosis not present

## 2020-01-14 DIAGNOSIS — D508 Other iron deficiency anemias: Secondary | ICD-10-CM | POA: Diagnosis not present

## 2020-01-14 DIAGNOSIS — Z20822 Contact with and (suspected) exposure to covid-19: Secondary | ICD-10-CM | POA: Diagnosis not present

## 2020-01-14 DIAGNOSIS — E1121 Type 2 diabetes mellitus with diabetic nephropathy: Secondary | ICD-10-CM | POA: Diagnosis not present

## 2020-01-15 DIAGNOSIS — L97812 Non-pressure chronic ulcer of other part of right lower leg with fat layer exposed: Secondary | ICD-10-CM | POA: Diagnosis not present

## 2020-01-15 DIAGNOSIS — I739 Peripheral vascular disease, unspecified: Secondary | ICD-10-CM | POA: Diagnosis not present

## 2020-01-15 DIAGNOSIS — Z48812 Encounter for surgical aftercare following surgery on the circulatory system: Secondary | ICD-10-CM | POA: Diagnosis not present

## 2020-01-15 DIAGNOSIS — R0902 Hypoxemia: Secondary | ICD-10-CM | POA: Diagnosis not present

## 2020-01-15 DIAGNOSIS — I609 Nontraumatic subarachnoid hemorrhage, unspecified: Secondary | ICD-10-CM | POA: Diagnosis not present

## 2020-01-15 DIAGNOSIS — R4020 Unspecified coma: Secondary | ICD-10-CM | POA: Diagnosis not present

## 2020-01-15 DIAGNOSIS — Z03818 Encounter for observation for suspected exposure to other biological agents ruled out: Secondary | ICD-10-CM | POA: Diagnosis not present

## 2020-01-15 DIAGNOSIS — E039 Hypothyroidism, unspecified: Secondary | ICD-10-CM | POA: Diagnosis not present

## 2020-01-15 DIAGNOSIS — E0520 Thyrotoxicosis with toxic multinodular goiter without thyrotoxic crisis or storm: Secondary | ICD-10-CM | POA: Diagnosis not present

## 2020-01-15 DIAGNOSIS — S065X0A Traumatic subdural hemorrhage without loss of consciousness, initial encounter: Secondary | ICD-10-CM | POA: Diagnosis not present

## 2020-01-15 DIAGNOSIS — R310 Gross hematuria: Secondary | ICD-10-CM | POA: Diagnosis not present

## 2020-01-15 DIAGNOSIS — T7840XA Allergy, unspecified, initial encounter: Secondary | ICD-10-CM | POA: Diagnosis not present

## 2020-01-15 DIAGNOSIS — W19XXXD Unspecified fall, subsequent encounter: Secondary | ICD-10-CM | POA: Diagnosis not present

## 2020-01-15 DIAGNOSIS — I951 Orthostatic hypotension: Secondary | ICD-10-CM | POA: Diagnosis not present

## 2020-01-15 DIAGNOSIS — N200 Calculus of kidney: Secondary | ICD-10-CM | POA: Diagnosis not present

## 2020-01-15 DIAGNOSIS — E1151 Type 2 diabetes mellitus with diabetic peripheral angiopathy without gangrene: Secondary | ICD-10-CM | POA: Diagnosis not present

## 2020-01-15 DIAGNOSIS — Z79899 Other long term (current) drug therapy: Secondary | ICD-10-CM | POA: Diagnosis not present

## 2020-01-15 DIAGNOSIS — Z8701 Personal history of pneumonia (recurrent): Secondary | ICD-10-CM | POA: Diagnosis not present

## 2020-01-15 DIAGNOSIS — R339 Retention of urine, unspecified: Secondary | ICD-10-CM | POA: Diagnosis not present

## 2020-01-15 DIAGNOSIS — N183 Chronic kidney disease, stage 3 unspecified: Secondary | ICD-10-CM | POA: Diagnosis not present

## 2020-01-15 DIAGNOSIS — Z6838 Body mass index (BMI) 38.0-38.9, adult: Secondary | ICD-10-CM | POA: Diagnosis not present

## 2020-01-15 DIAGNOSIS — I428 Other cardiomyopathies: Secondary | ICD-10-CM | POA: Diagnosis not present

## 2020-01-15 DIAGNOSIS — E1142 Type 2 diabetes mellitus with diabetic polyneuropathy: Secondary | ICD-10-CM | POA: Diagnosis not present

## 2020-01-15 DIAGNOSIS — I70201 Unspecified atherosclerosis of native arteries of extremities, right leg: Secondary | ICD-10-CM | POA: Diagnosis not present

## 2020-01-15 DIAGNOSIS — R52 Pain, unspecified: Secondary | ICD-10-CM | POA: Diagnosis not present

## 2020-01-15 DIAGNOSIS — Z8546 Personal history of malignant neoplasm of prostate: Secondary | ICD-10-CM | POA: Diagnosis not present

## 2020-01-15 DIAGNOSIS — M1990 Unspecified osteoarthritis, unspecified site: Secondary | ICD-10-CM | POA: Diagnosis not present

## 2020-01-15 DIAGNOSIS — R001 Bradycardia, unspecified: Secondary | ICD-10-CM | POA: Diagnosis not present

## 2020-01-15 DIAGNOSIS — N390 Urinary tract infection, site not specified: Secondary | ICD-10-CM | POA: Diagnosis not present

## 2020-01-15 DIAGNOSIS — I890 Lymphedema, not elsewhere classified: Secondary | ICD-10-CM | POA: Diagnosis not present

## 2020-01-15 DIAGNOSIS — R21 Rash and other nonspecific skin eruption: Secondary | ICD-10-CM | POA: Diagnosis not present

## 2020-01-15 DIAGNOSIS — Z85038 Personal history of other malignant neoplasm of large intestine: Secondary | ICD-10-CM | POA: Diagnosis not present

## 2020-01-15 DIAGNOSIS — I959 Hypotension, unspecified: Secondary | ICD-10-CM | POA: Diagnosis not present

## 2020-01-15 DIAGNOSIS — Z7902 Long term (current) use of antithrombotics/antiplatelets: Secondary | ICD-10-CM | POA: Diagnosis not present

## 2020-01-15 DIAGNOSIS — I87311 Chronic venous hypertension (idiopathic) with ulcer of right lower extremity: Secondary | ICD-10-CM | POA: Diagnosis not present

## 2020-01-15 DIAGNOSIS — E1122 Type 2 diabetes mellitus with diabetic chronic kidney disease: Secondary | ICD-10-CM | POA: Diagnosis not present

## 2020-01-15 DIAGNOSIS — J4520 Mild intermittent asthma, uncomplicated: Secondary | ICD-10-CM | POA: Diagnosis not present

## 2020-01-15 DIAGNOSIS — H9190 Unspecified hearing loss, unspecified ear: Secondary | ICD-10-CM | POA: Diagnosis not present

## 2020-01-15 DIAGNOSIS — I0981 Rheumatic heart failure: Secondary | ICD-10-CM | POA: Diagnosis not present

## 2020-01-15 DIAGNOSIS — R55 Syncope and collapse: Secondary | ICD-10-CM | POA: Diagnosis not present

## 2020-01-15 DIAGNOSIS — I081 Rheumatic disorders of both mitral and tricuspid valves: Secondary | ICD-10-CM | POA: Diagnosis not present

## 2020-01-15 DIAGNOSIS — Z4801 Encounter for change or removal of surgical wound dressing: Secondary | ICD-10-CM | POA: Diagnosis not present

## 2020-01-15 DIAGNOSIS — Z452 Encounter for adjustment and management of vascular access device: Secondary | ICD-10-CM | POA: Diagnosis not present

## 2020-01-15 DIAGNOSIS — C9210 Chronic myeloid leukemia, BCR/ABL-positive, not having achieved remission: Secondary | ICD-10-CM | POA: Diagnosis not present

## 2020-01-15 DIAGNOSIS — E11622 Type 2 diabetes mellitus with other skin ulcer: Secondary | ICD-10-CM | POA: Diagnosis not present

## 2020-01-15 DIAGNOSIS — J9311 Primary spontaneous pneumothorax: Secondary | ICD-10-CM | POA: Diagnosis not present

## 2020-01-15 DIAGNOSIS — R1111 Vomiting without nausea: Secondary | ICD-10-CM | POA: Diagnosis not present

## 2020-01-15 DIAGNOSIS — E1169 Type 2 diabetes mellitus with other specified complication: Secondary | ICD-10-CM | POA: Diagnosis not present

## 2020-01-15 DIAGNOSIS — Z95820 Peripheral vascular angioplasty status with implants and grafts: Secondary | ICD-10-CM | POA: Diagnosis not present

## 2020-01-15 DIAGNOSIS — I130 Hypertensive heart and chronic kidney disease with heart failure and stage 1 through stage 4 chronic kidney disease, or unspecified chronic kidney disease: Secondary | ICD-10-CM | POA: Diagnosis not present

## 2020-01-15 DIAGNOSIS — I482 Chronic atrial fibrillation, unspecified: Secondary | ICD-10-CM | POA: Diagnosis not present

## 2020-01-15 DIAGNOSIS — Z9181 History of falling: Secondary | ICD-10-CM | POA: Diagnosis not present

## 2020-01-15 DIAGNOSIS — R062 Wheezing: Secondary | ICD-10-CM | POA: Diagnosis not present

## 2020-01-15 DIAGNOSIS — F17210 Nicotine dependence, cigarettes, uncomplicated: Secondary | ICD-10-CM | POA: Diagnosis not present

## 2020-01-15 DIAGNOSIS — S0101XD Laceration without foreign body of scalp, subsequent encounter: Secondary | ICD-10-CM | POA: Diagnosis not present

## 2020-01-15 DIAGNOSIS — I10 Essential (primary) hypertension: Secondary | ICD-10-CM | POA: Diagnosis not present

## 2020-01-15 DIAGNOSIS — I5022 Chronic systolic (congestive) heart failure: Secondary | ICD-10-CM | POA: Diagnosis not present

## 2020-01-15 DIAGNOSIS — Z87891 Personal history of nicotine dependence: Secondary | ICD-10-CM | POA: Diagnosis not present

## 2020-01-15 DIAGNOSIS — Z7984 Long term (current) use of oral hypoglycemic drugs: Secondary | ICD-10-CM | POA: Diagnosis not present

## 2020-01-15 DIAGNOSIS — T148XXA Other injury of unspecified body region, initial encounter: Secondary | ICD-10-CM | POA: Diagnosis not present

## 2020-01-16 DIAGNOSIS — C50919 Malignant neoplasm of unspecified site of unspecified female breast: Secondary | ICD-10-CM | POA: Diagnosis not present

## 2020-01-16 DIAGNOSIS — M96840 Postprocedural hematoma of a musculoskeletal structure following a musculoskeletal system procedure: Secondary | ICD-10-CM | POA: Diagnosis not present

## 2020-01-16 DIAGNOSIS — G459 Transient cerebral ischemic attack, unspecified: Secondary | ICD-10-CM | POA: Diagnosis not present

## 2020-01-16 DIAGNOSIS — E1142 Type 2 diabetes mellitus with diabetic polyneuropathy: Secondary | ICD-10-CM | POA: Diagnosis not present

## 2020-01-16 DIAGNOSIS — I509 Heart failure, unspecified: Secondary | ICD-10-CM | POA: Diagnosis not present

## 2020-01-16 DIAGNOSIS — M542 Cervicalgia: Secondary | ICD-10-CM | POA: Diagnosis not present

## 2020-01-16 DIAGNOSIS — D519 Vitamin B12 deficiency anemia, unspecified: Secondary | ICD-10-CM | POA: Diagnosis not present

## 2020-01-16 DIAGNOSIS — F418 Other specified anxiety disorders: Secondary | ICD-10-CM | POA: Diagnosis not present

## 2020-01-16 DIAGNOSIS — I4891 Unspecified atrial fibrillation: Secondary | ICD-10-CM | POA: Diagnosis not present

## 2020-01-16 DIAGNOSIS — R609 Edema, unspecified: Secondary | ICD-10-CM | POA: Diagnosis not present

## 2020-01-16 DIAGNOSIS — I129 Hypertensive chronic kidney disease with stage 1 through stage 4 chronic kidney disease, or unspecified chronic kidney disease: Secondary | ICD-10-CM | POA: Diagnosis not present

## 2020-01-16 DIAGNOSIS — N183 Chronic kidney disease, stage 3 unspecified: Secondary | ICD-10-CM | POA: Diagnosis not present

## 2020-01-16 DIAGNOSIS — R1012 Left upper quadrant pain: Secondary | ICD-10-CM | POA: Diagnosis not present

## 2020-01-16 DIAGNOSIS — R32 Unspecified urinary incontinence: Secondary | ICD-10-CM | POA: Diagnosis not present

## 2020-01-16 DIAGNOSIS — F21 Schizotypal disorder: Secondary | ICD-10-CM | POA: Diagnosis not present

## 2020-01-16 DIAGNOSIS — J4530 Mild persistent asthma, uncomplicated: Secondary | ICD-10-CM | POA: Diagnosis not present

## 2020-01-16 DIAGNOSIS — H409 Unspecified glaucoma: Secondary | ICD-10-CM | POA: Diagnosis not present

## 2020-01-16 DIAGNOSIS — C61 Malignant neoplasm of prostate: Secondary | ICD-10-CM | POA: Diagnosis not present

## 2020-01-16 DIAGNOSIS — Z8719 Personal history of other diseases of the digestive system: Secondary | ICD-10-CM | POA: Diagnosis not present

## 2020-01-16 DIAGNOSIS — R7881 Bacteremia: Secondary | ICD-10-CM | POA: Diagnosis not present

## 2020-01-16 DIAGNOSIS — R972 Elevated prostate specific antigen [PSA]: Secondary | ICD-10-CM | POA: Diagnosis not present

## 2020-01-16 DIAGNOSIS — M545 Low back pain: Secondary | ICD-10-CM | POA: Diagnosis not present

## 2020-01-16 DIAGNOSIS — K567 Ileus, unspecified: Secondary | ICD-10-CM | POA: Diagnosis not present

## 2020-01-16 DIAGNOSIS — R570 Cardiogenic shock: Secondary | ICD-10-CM | POA: Diagnosis not present

## 2020-01-16 DIAGNOSIS — M1991 Primary osteoarthritis, unspecified site: Secondary | ICD-10-CM | POA: Diagnosis not present

## 2020-01-16 DIAGNOSIS — M109 Gout, unspecified: Secondary | ICD-10-CM | POA: Diagnosis not present

## 2020-01-16 DIAGNOSIS — I959 Hypotension, unspecified: Secondary | ICD-10-CM | POA: Diagnosis not present

## 2020-01-16 DIAGNOSIS — K5090 Crohn's disease, unspecified, without complications: Secondary | ICD-10-CM | POA: Diagnosis not present

## 2020-01-16 DIAGNOSIS — I480 Paroxysmal atrial fibrillation: Secondary | ICD-10-CM | POA: Diagnosis not present

## 2020-01-16 DIAGNOSIS — E860 Dehydration: Secondary | ICD-10-CM | POA: Diagnosis not present

## 2020-01-16 DIAGNOSIS — M179 Osteoarthritis of knee, unspecified: Secondary | ICD-10-CM | POA: Diagnosis not present

## 2020-01-16 DIAGNOSIS — I471 Supraventricular tachycardia: Secondary | ICD-10-CM | POA: Diagnosis not present

## 2020-01-16 DIAGNOSIS — D649 Anemia, unspecified: Secondary | ICD-10-CM | POA: Diagnosis not present

## 2020-01-16 DIAGNOSIS — Z96643 Presence of artificial hip joint, bilateral: Secondary | ICD-10-CM | POA: Diagnosis not present

## 2020-01-16 DIAGNOSIS — R0789 Other chest pain: Secondary | ICD-10-CM | POA: Diagnosis not present

## 2020-01-16 DIAGNOSIS — G250 Essential tremor: Secondary | ICD-10-CM | POA: Diagnosis not present

## 2020-01-16 DIAGNOSIS — L74519 Primary focal hyperhidrosis, unspecified: Secondary | ICD-10-CM | POA: Diagnosis not present

## 2020-01-16 DIAGNOSIS — M13841 Other specified arthritis, right hand: Secondary | ICD-10-CM | POA: Diagnosis not present

## 2020-01-16 DIAGNOSIS — N390 Urinary tract infection, site not specified: Secondary | ICD-10-CM | POA: Diagnosis not present

## 2020-01-16 DIAGNOSIS — J9601 Acute respiratory failure with hypoxia: Secondary | ICD-10-CM | POA: Diagnosis not present

## 2020-01-16 DIAGNOSIS — E039 Hypothyroidism, unspecified: Secondary | ICD-10-CM | POA: Diagnosis not present

## 2020-01-16 DIAGNOSIS — E785 Hyperlipidemia, unspecified: Secondary | ICD-10-CM | POA: Diagnosis not present

## 2020-01-16 DIAGNOSIS — T80212D Local infection due to central venous catheter, subsequent encounter: Secondary | ICD-10-CM | POA: Diagnosis not present

## 2020-01-16 DIAGNOSIS — J439 Emphysema, unspecified: Secondary | ICD-10-CM | POA: Diagnosis not present

## 2020-01-16 DIAGNOSIS — I130 Hypertensive heart and chronic kidney disease with heart failure and stage 1 through stage 4 chronic kidney disease, or unspecified chronic kidney disease: Secondary | ICD-10-CM | POA: Diagnosis not present

## 2020-01-16 DIAGNOSIS — J45909 Unspecified asthma, uncomplicated: Secondary | ICD-10-CM | POA: Diagnosis not present

## 2020-01-16 DIAGNOSIS — E559 Vitamin D deficiency, unspecified: Secondary | ICD-10-CM | POA: Diagnosis not present

## 2020-01-16 DIAGNOSIS — J449 Chronic obstructive pulmonary disease, unspecified: Secondary | ICD-10-CM | POA: Diagnosis not present

## 2020-01-16 DIAGNOSIS — F330 Major depressive disorder, recurrent, mild: Secondary | ICD-10-CM | POA: Diagnosis not present

## 2020-01-16 DIAGNOSIS — J441 Chronic obstructive pulmonary disease with (acute) exacerbation: Secondary | ICD-10-CM | POA: Diagnosis not present

## 2020-01-16 DIAGNOSIS — I10 Essential (primary) hypertension: Secondary | ICD-10-CM | POA: Diagnosis not present

## 2020-01-16 DIAGNOSIS — G64 Other disorders of peripheral nervous system: Secondary | ICD-10-CM | POA: Diagnosis not present

## 2020-01-16 DIAGNOSIS — E1129 Type 2 diabetes mellitus with other diabetic kidney complication: Secondary | ICD-10-CM | POA: Diagnosis not present

## 2020-01-16 DIAGNOSIS — R7309 Other abnormal glucose: Secondary | ICD-10-CM | POA: Diagnosis not present

## 2020-01-16 DIAGNOSIS — Z862 Personal history of diseases of the blood and blood-forming organs and certain disorders involving the immune mechanism: Secondary | ICD-10-CM | POA: Diagnosis not present

## 2020-01-16 DIAGNOSIS — I739 Peripheral vascular disease, unspecified: Secondary | ICD-10-CM | POA: Diagnosis not present

## 2020-01-16 DIAGNOSIS — M8580 Other specified disorders of bone density and structure, unspecified site: Secondary | ICD-10-CM | POA: Diagnosis not present

## 2020-01-16 DIAGNOSIS — F329 Major depressive disorder, single episode, unspecified: Secondary | ICD-10-CM | POA: Diagnosis not present

## 2020-01-16 DIAGNOSIS — G8929 Other chronic pain: Secondary | ICD-10-CM | POA: Diagnosis not present

## 2020-01-16 DIAGNOSIS — J301 Allergic rhinitis due to pollen: Secondary | ICD-10-CM | POA: Diagnosis not present

## 2020-01-16 DIAGNOSIS — M1990 Unspecified osteoarthritis, unspecified site: Secondary | ICD-10-CM | POA: Diagnosis not present

## 2020-01-16 DIAGNOSIS — N179 Acute kidney failure, unspecified: Secondary | ICD-10-CM | POA: Diagnosis not present

## 2020-01-16 DIAGNOSIS — Z888 Allergy status to other drugs, medicaments and biological substances status: Secondary | ICD-10-CM | POA: Diagnosis not present

## 2020-01-16 DIAGNOSIS — M519 Unspecified thoracic, thoracolumbar and lumbosacral intervertebral disc disorder: Secondary | ICD-10-CM | POA: Diagnosis not present

## 2020-01-16 DIAGNOSIS — C182 Malignant neoplasm of ascending colon: Secondary | ICD-10-CM | POA: Diagnosis not present

## 2020-01-16 DIAGNOSIS — Z87891 Personal history of nicotine dependence: Secondary | ICD-10-CM | POA: Diagnosis not present

## 2020-01-16 DIAGNOSIS — J9311 Primary spontaneous pneumothorax: Secondary | ICD-10-CM | POA: Diagnosis not present

## 2020-01-16 DIAGNOSIS — M4726 Other spondylosis with radiculopathy, lumbar region: Secondary | ICD-10-CM | POA: Diagnosis not present

## 2020-01-16 DIAGNOSIS — Z20822 Contact with and (suspected) exposure to covid-19: Secondary | ICD-10-CM | POA: Diagnosis not present

## 2020-01-16 DIAGNOSIS — I119 Hypertensive heart disease without heart failure: Secondary | ICD-10-CM | POA: Diagnosis not present

## 2020-01-16 DIAGNOSIS — K9189 Other postprocedural complications and disorders of digestive system: Secondary | ICD-10-CM | POA: Diagnosis not present

## 2020-01-16 DIAGNOSIS — M159 Polyosteoarthritis, unspecified: Secondary | ICD-10-CM | POA: Diagnosis not present

## 2020-01-16 DIAGNOSIS — D509 Iron deficiency anemia, unspecified: Secondary | ICD-10-CM | POA: Diagnosis not present

## 2020-01-16 DIAGNOSIS — S36039A Unspecified laceration of spleen, initial encounter: Secondary | ICD-10-CM | POA: Diagnosis not present

## 2020-01-16 DIAGNOSIS — E7800 Pure hypercholesterolemia, unspecified: Secondary | ICD-10-CM | POA: Diagnosis not present

## 2020-01-16 DIAGNOSIS — E7849 Other hyperlipidemia: Secondary | ICD-10-CM | POA: Diagnosis not present

## 2020-01-16 DIAGNOSIS — I110 Hypertensive heart disease with heart failure: Secondary | ICD-10-CM | POA: Diagnosis not present

## 2020-01-16 DIAGNOSIS — E1165 Type 2 diabetes mellitus with hyperglycemia: Secondary | ICD-10-CM | POA: Diagnosis not present

## 2020-01-16 DIAGNOSIS — I722 Aneurysm of renal artery: Secondary | ICD-10-CM | POA: Diagnosis not present

## 2020-01-16 DIAGNOSIS — Z79899 Other long term (current) drug therapy: Secondary | ICD-10-CM | POA: Diagnosis not present

## 2020-01-16 DIAGNOSIS — Z7952 Long term (current) use of systemic steroids: Secondary | ICD-10-CM | POA: Diagnosis not present

## 2020-01-16 DIAGNOSIS — R1013 Epigastric pain: Secondary | ICD-10-CM | POA: Diagnosis not present

## 2020-01-16 DIAGNOSIS — R42 Dizziness and giddiness: Secondary | ICD-10-CM | POA: Diagnosis not present

## 2020-01-16 DIAGNOSIS — E1169 Type 2 diabetes mellitus with other specified complication: Secondary | ICD-10-CM | POA: Diagnosis not present

## 2020-01-16 DIAGNOSIS — G620 Drug-induced polyneuropathy: Secondary | ICD-10-CM | POA: Diagnosis not present

## 2020-01-16 DIAGNOSIS — M5431 Sciatica, right side: Secondary | ICD-10-CM | POA: Diagnosis not present

## 2020-01-16 DIAGNOSIS — D179 Benign lipomatous neoplasm, unspecified: Secondary | ICD-10-CM | POA: Diagnosis not present

## 2020-01-16 DIAGNOSIS — G894 Chronic pain syndrome: Secondary | ICD-10-CM | POA: Diagnosis not present

## 2020-01-16 DIAGNOSIS — I824Y9 Acute embolism and thrombosis of unspecified deep veins of unspecified proximal lower extremity: Secondary | ICD-10-CM | POA: Diagnosis not present

## 2020-01-16 DIAGNOSIS — M791 Myalgia, unspecified site: Secondary | ICD-10-CM | POA: Diagnosis not present

## 2020-01-16 DIAGNOSIS — K281 Acute gastrojejunal ulcer with perforation: Secondary | ICD-10-CM | POA: Diagnosis not present

## 2020-01-16 DIAGNOSIS — I25709 Atherosclerosis of coronary artery bypass graft(s), unspecified, with unspecified angina pectoris: Secondary | ICD-10-CM | POA: Diagnosis not present

## 2020-01-16 DIAGNOSIS — R079 Chest pain, unspecified: Secondary | ICD-10-CM | POA: Diagnosis not present

## 2020-01-16 DIAGNOSIS — R5383 Other fatigue: Secondary | ICD-10-CM | POA: Diagnosis not present

## 2020-01-16 DIAGNOSIS — U071 COVID-19: Secondary | ICD-10-CM | POA: Diagnosis not present

## 2020-01-16 DIAGNOSIS — K219 Gastro-esophageal reflux disease without esophagitis: Secondary | ICD-10-CM | POA: Diagnosis not present

## 2020-01-16 DIAGNOSIS — I469 Cardiac arrest, cause unspecified: Secondary | ICD-10-CM | POA: Diagnosis not present

## 2020-01-16 DIAGNOSIS — N184 Chronic kidney disease, stage 4 (severe): Secondary | ICD-10-CM | POA: Diagnosis not present

## 2020-01-16 DIAGNOSIS — D508 Other iron deficiency anemias: Secondary | ICD-10-CM | POA: Diagnosis not present

## 2020-01-16 DIAGNOSIS — N400 Enlarged prostate without lower urinary tract symptoms: Secondary | ICD-10-CM | POA: Diagnosis not present

## 2020-01-16 DIAGNOSIS — K651 Peritoneal abscess: Secondary | ICD-10-CM | POA: Diagnosis not present

## 2020-01-16 DIAGNOSIS — G309 Alzheimer's disease, unspecified: Secondary | ICD-10-CM | POA: Diagnosis not present

## 2020-01-16 DIAGNOSIS — J810 Acute pulmonary edema: Secondary | ICD-10-CM | POA: Diagnosis not present

## 2020-01-16 DIAGNOSIS — J1282 Pneumonia due to coronavirus disease 2019: Secondary | ICD-10-CM | POA: Diagnosis not present

## 2020-01-16 DIAGNOSIS — J42 Unspecified chronic bronchitis: Secondary | ICD-10-CM | POA: Diagnosis not present

## 2020-01-16 DIAGNOSIS — E1122 Type 2 diabetes mellitus with diabetic chronic kidney disease: Secondary | ICD-10-CM | POA: Diagnosis not present

## 2020-01-16 DIAGNOSIS — M329 Systemic lupus erythematosus, unspecified: Secondary | ICD-10-CM | POA: Diagnosis not present

## 2020-01-16 DIAGNOSIS — G4700 Insomnia, unspecified: Secondary | ICD-10-CM | POA: Diagnosis not present

## 2020-01-16 DIAGNOSIS — G629 Polyneuropathy, unspecified: Secondary | ICD-10-CM | POA: Diagnosis not present

## 2020-01-16 DIAGNOSIS — F321 Major depressive disorder, single episode, moderate: Secondary | ICD-10-CM | POA: Diagnosis not present

## 2020-01-16 DIAGNOSIS — M549 Dorsalgia, unspecified: Secondary | ICD-10-CM | POA: Diagnosis not present

## 2020-01-16 DIAGNOSIS — I482 Chronic atrial fibrillation, unspecified: Secondary | ICD-10-CM | POA: Diagnosis not present

## 2020-01-16 DIAGNOSIS — N1832 Chronic kidney disease, stage 3b: Secondary | ICD-10-CM | POA: Diagnosis not present

## 2020-01-16 DIAGNOSIS — I2510 Atherosclerotic heart disease of native coronary artery without angina pectoris: Secondary | ICD-10-CM | POA: Diagnosis not present

## 2020-01-16 DIAGNOSIS — E782 Mixed hyperlipidemia: Secondary | ICD-10-CM | POA: Diagnosis not present

## 2020-01-16 DIAGNOSIS — E0500 Thyrotoxicosis with diffuse goiter without thyrotoxic crisis or storm: Secondary | ICD-10-CM | POA: Diagnosis not present

## 2020-01-16 DIAGNOSIS — I200 Unstable angina: Secondary | ICD-10-CM | POA: Diagnosis not present

## 2020-01-16 DIAGNOSIS — F419 Anxiety disorder, unspecified: Secondary | ICD-10-CM | POA: Diagnosis not present

## 2020-01-16 DIAGNOSIS — L281 Prurigo nodularis: Secondary | ICD-10-CM | POA: Diagnosis not present

## 2020-01-16 DIAGNOSIS — N189 Chronic kidney disease, unspecified: Secondary | ICD-10-CM | POA: Diagnosis not present

## 2020-01-16 DIAGNOSIS — I5022 Chronic systolic (congestive) heart failure: Secondary | ICD-10-CM | POA: Diagnosis not present

## 2020-01-16 DIAGNOSIS — E119 Type 2 diabetes mellitus without complications: Secondary | ICD-10-CM | POA: Diagnosis not present

## 2020-01-16 DIAGNOSIS — F4542 Pain disorder with related psychological factors: Secondary | ICD-10-CM | POA: Diagnosis not present

## 2020-01-16 DIAGNOSIS — G5700 Lesion of sciatic nerve, unspecified lower limb: Secondary | ICD-10-CM | POA: Diagnosis not present

## 2020-01-16 DIAGNOSIS — M13842 Other specified arthritis, left hand: Secondary | ICD-10-CM | POA: Diagnosis not present

## 2020-01-16 DIAGNOSIS — N182 Chronic kidney disease, stage 2 (mild): Secondary | ICD-10-CM | POA: Diagnosis not present

## 2020-01-16 DIAGNOSIS — E063 Autoimmune thyroiditis: Secondary | ICD-10-CM | POA: Diagnosis not present

## 2020-01-16 DIAGNOSIS — M1710 Unilateral primary osteoarthritis, unspecified knee: Secondary | ICD-10-CM | POA: Diagnosis not present

## 2020-01-17 DIAGNOSIS — K529 Noninfective gastroenteritis and colitis, unspecified: Secondary | ICD-10-CM | POA: Diagnosis not present

## 2020-01-17 DIAGNOSIS — F1190 Opioid use, unspecified, uncomplicated: Secondary | ICD-10-CM | POA: Diagnosis not present

## 2020-01-17 DIAGNOSIS — R05 Cough: Secondary | ICD-10-CM | POA: Diagnosis not present

## 2020-01-17 DIAGNOSIS — Z20828 Contact with and (suspected) exposure to other viral communicable diseases: Secondary | ICD-10-CM | POA: Diagnosis not present

## 2020-01-17 DIAGNOSIS — H832X9 Labyrinthine dysfunction, unspecified ear: Secondary | ICD-10-CM | POA: Diagnosis not present

## 2020-01-17 DIAGNOSIS — R4189 Other symptoms and signs involving cognitive functions and awareness: Secondary | ICD-10-CM | POA: Diagnosis not present

## 2020-01-17 DIAGNOSIS — Z170 Estrogen receptor positive status [ER+]: Secondary | ICD-10-CM | POA: Diagnosis not present

## 2020-01-17 DIAGNOSIS — M25542 Pain in joints of left hand: Secondary | ICD-10-CM | POA: Diagnosis not present

## 2020-01-17 DIAGNOSIS — I10 Essential (primary) hypertension: Secondary | ICD-10-CM | POA: Diagnosis not present

## 2020-01-17 DIAGNOSIS — S72011D Unspecified intracapsular fracture of right femur, subsequent encounter for closed fracture with routine healing: Secondary | ICD-10-CM | POA: Diagnosis not present

## 2020-01-17 DIAGNOSIS — M4800 Spinal stenosis, site unspecified: Secondary | ICD-10-CM | POA: Diagnosis not present

## 2020-01-17 DIAGNOSIS — S82034A Nondisplaced transverse fracture of right patella, initial encounter for closed fracture: Secondary | ICD-10-CM | POA: Diagnosis not present

## 2020-01-17 DIAGNOSIS — N2581 Secondary hyperparathyroidism of renal origin: Secondary | ICD-10-CM | POA: Diagnosis not present

## 2020-01-17 DIAGNOSIS — I509 Heart failure, unspecified: Secondary | ICD-10-CM | POA: Diagnosis not present

## 2020-01-17 DIAGNOSIS — R269 Unspecified abnormalities of gait and mobility: Secondary | ICD-10-CM | POA: Diagnosis not present

## 2020-01-17 DIAGNOSIS — I130 Hypertensive heart and chronic kidney disease with heart failure and stage 1 through stage 4 chronic kidney disease, or unspecified chronic kidney disease: Secondary | ICD-10-CM | POA: Diagnosis not present

## 2020-01-17 DIAGNOSIS — B370 Candidal stomatitis: Secondary | ICD-10-CM | POA: Diagnosis not present

## 2020-01-17 DIAGNOSIS — G8222 Paraplegia, incomplete: Secondary | ICD-10-CM | POA: Diagnosis not present

## 2020-01-17 DIAGNOSIS — I5032 Chronic diastolic (congestive) heart failure: Secondary | ICD-10-CM | POA: Diagnosis not present

## 2020-01-17 DIAGNOSIS — M86671 Other chronic osteomyelitis, right ankle and foot: Secondary | ICD-10-CM | POA: Diagnosis not present

## 2020-01-17 DIAGNOSIS — L03115 Cellulitis of right lower limb: Secondary | ICD-10-CM | POA: Diagnosis not present

## 2020-01-17 DIAGNOSIS — S42302D Unspecified fracture of shaft of humerus, left arm, subsequent encounter for fracture with routine healing: Secondary | ICD-10-CM | POA: Diagnosis not present

## 2020-01-17 DIAGNOSIS — M797 Fibromyalgia: Secondary | ICD-10-CM | POA: Diagnosis not present

## 2020-01-17 DIAGNOSIS — N401 Enlarged prostate with lower urinary tract symptoms: Secondary | ICD-10-CM | POA: Diagnosis not present

## 2020-01-17 DIAGNOSIS — M25662 Stiffness of left knee, not elsewhere classified: Secondary | ICD-10-CM | POA: Diagnosis not present

## 2020-01-17 DIAGNOSIS — E538 Deficiency of other specified B group vitamins: Secondary | ICD-10-CM | POA: Diagnosis not present

## 2020-01-17 DIAGNOSIS — R0981 Nasal congestion: Secondary | ICD-10-CM | POA: Diagnosis not present

## 2020-01-17 DIAGNOSIS — K7581 Nonalcoholic steatohepatitis (NASH): Secondary | ICD-10-CM | POA: Diagnosis not present

## 2020-01-17 DIAGNOSIS — J849 Interstitial pulmonary disease, unspecified: Secondary | ICD-10-CM | POA: Diagnosis not present

## 2020-01-17 DIAGNOSIS — G218 Other secondary parkinsonism: Secondary | ICD-10-CM | POA: Diagnosis not present

## 2020-01-17 DIAGNOSIS — L97919 Non-pressure chronic ulcer of unspecified part of right lower leg with unspecified severity: Secondary | ICD-10-CM | POA: Diagnosis not present

## 2020-01-17 DIAGNOSIS — M14671 Charcot's joint, right ankle and foot: Secondary | ICD-10-CM | POA: Diagnosis not present

## 2020-01-17 DIAGNOSIS — J3089 Other allergic rhinitis: Secondary | ICD-10-CM | POA: Diagnosis not present

## 2020-01-17 DIAGNOSIS — E11622 Type 2 diabetes mellitus with other skin ulcer: Secondary | ICD-10-CM | POA: Diagnosis not present

## 2020-01-17 DIAGNOSIS — J9311 Primary spontaneous pneumothorax: Secondary | ICD-10-CM | POA: Diagnosis not present

## 2020-01-17 DIAGNOSIS — I428 Other cardiomyopathies: Secondary | ICD-10-CM | POA: Diagnosis not present

## 2020-01-17 DIAGNOSIS — R279 Unspecified lack of coordination: Secondary | ICD-10-CM | POA: Diagnosis not present

## 2020-01-17 DIAGNOSIS — L03317 Cellulitis of buttock: Secondary | ICD-10-CM | POA: Diagnosis not present

## 2020-01-17 DIAGNOSIS — J9621 Acute and chronic respiratory failure with hypoxia: Secondary | ICD-10-CM | POA: Diagnosis not present

## 2020-01-17 DIAGNOSIS — I82512 Chronic embolism and thrombosis of left femoral vein: Secondary | ICD-10-CM | POA: Diagnosis not present

## 2020-01-17 DIAGNOSIS — E871 Hypo-osmolality and hyponatremia: Secondary | ICD-10-CM | POA: Diagnosis not present

## 2020-01-17 DIAGNOSIS — G459 Transient cerebral ischemic attack, unspecified: Secondary | ICD-10-CM | POA: Diagnosis not present

## 2020-01-17 DIAGNOSIS — Z510 Encounter for antineoplastic radiation therapy: Secondary | ICD-10-CM | POA: Diagnosis not present

## 2020-01-17 DIAGNOSIS — M86171 Other acute osteomyelitis, right ankle and foot: Secondary | ICD-10-CM | POA: Diagnosis not present

## 2020-01-17 DIAGNOSIS — I701 Atherosclerosis of renal artery: Secondary | ICD-10-CM | POA: Diagnosis not present

## 2020-01-17 DIAGNOSIS — E1151 Type 2 diabetes mellitus with diabetic peripheral angiopathy without gangrene: Secondary | ICD-10-CM | POA: Diagnosis not present

## 2020-01-17 DIAGNOSIS — E569 Vitamin deficiency, unspecified: Secondary | ICD-10-CM | POA: Diagnosis not present

## 2020-01-17 DIAGNOSIS — C187 Malignant neoplasm of sigmoid colon: Secondary | ICD-10-CM | POA: Diagnosis not present

## 2020-01-17 DIAGNOSIS — Z4789 Encounter for other orthopedic aftercare: Secondary | ICD-10-CM | POA: Diagnosis not present

## 2020-01-17 DIAGNOSIS — Z95820 Peripheral vascular angioplasty status with implants and grafts: Secondary | ICD-10-CM | POA: Diagnosis not present

## 2020-01-17 DIAGNOSIS — M5441 Lumbago with sciatica, right side: Secondary | ICD-10-CM | POA: Diagnosis not present

## 2020-01-17 DIAGNOSIS — E1142 Type 2 diabetes mellitus with diabetic polyneuropathy: Secondary | ICD-10-CM | POA: Diagnosis not present

## 2020-01-17 DIAGNOSIS — K5903 Drug induced constipation: Secondary | ICD-10-CM | POA: Diagnosis not present

## 2020-01-17 DIAGNOSIS — I82442 Acute embolism and thrombosis of left tibial vein: Secondary | ICD-10-CM | POA: Diagnosis not present

## 2020-01-17 DIAGNOSIS — Z7982 Long term (current) use of aspirin: Secondary | ICD-10-CM | POA: Diagnosis not present

## 2020-01-17 DIAGNOSIS — M65311 Trigger thumb, right thumb: Secondary | ICD-10-CM | POA: Diagnosis not present

## 2020-01-17 DIAGNOSIS — D6859 Other primary thrombophilia: Secondary | ICD-10-CM | POA: Diagnosis not present

## 2020-01-17 DIAGNOSIS — R7301 Impaired fasting glucose: Secondary | ICD-10-CM | POA: Diagnosis not present

## 2020-01-17 DIAGNOSIS — I82441 Acute embolism and thrombosis of right tibial vein: Secondary | ICD-10-CM | POA: Diagnosis not present

## 2020-01-17 DIAGNOSIS — G5622 Lesion of ulnar nerve, left upper limb: Secondary | ICD-10-CM | POA: Diagnosis not present

## 2020-01-17 DIAGNOSIS — N644 Mastodynia: Secondary | ICD-10-CM | POA: Diagnosis not present

## 2020-01-17 DIAGNOSIS — Z4800 Encounter for change or removal of nonsurgical wound dressing: Secondary | ICD-10-CM | POA: Diagnosis not present

## 2020-01-17 DIAGNOSIS — Z1231 Encounter for screening mammogram for malignant neoplasm of breast: Secondary | ICD-10-CM | POA: Diagnosis not present

## 2020-01-17 DIAGNOSIS — R0902 Hypoxemia: Secondary | ICD-10-CM | POA: Diagnosis not present

## 2020-01-17 DIAGNOSIS — R001 Bradycardia, unspecified: Secondary | ICD-10-CM | POA: Diagnosis not present

## 2020-01-17 DIAGNOSIS — I890 Lymphedema, not elsewhere classified: Secondary | ICD-10-CM | POA: Diagnosis not present

## 2020-01-17 DIAGNOSIS — I4821 Permanent atrial fibrillation: Secondary | ICD-10-CM | POA: Diagnosis not present

## 2020-01-17 DIAGNOSIS — N1831 Chronic kidney disease, stage 3a: Secondary | ICD-10-CM | POA: Diagnosis not present

## 2020-01-17 DIAGNOSIS — E43 Unspecified severe protein-calorie malnutrition: Secondary | ICD-10-CM | POA: Diagnosis not present

## 2020-01-17 DIAGNOSIS — E039 Hypothyroidism, unspecified: Secondary | ICD-10-CM | POA: Diagnosis not present

## 2020-01-17 DIAGNOSIS — F0390 Unspecified dementia without behavioral disturbance: Secondary | ICD-10-CM | POA: Diagnosis not present

## 2020-01-17 DIAGNOSIS — R030 Elevated blood-pressure reading, without diagnosis of hypertension: Secondary | ICD-10-CM | POA: Diagnosis not present

## 2020-01-17 DIAGNOSIS — E781 Pure hyperglyceridemia: Secondary | ICD-10-CM | POA: Diagnosis not present

## 2020-01-17 DIAGNOSIS — N179 Acute kidney failure, unspecified: Secondary | ICD-10-CM | POA: Diagnosis not present

## 2020-01-17 DIAGNOSIS — M25611 Stiffness of right shoulder, not elsewhere classified: Secondary | ICD-10-CM | POA: Diagnosis not present

## 2020-01-17 DIAGNOSIS — I69311 Memory deficit following cerebral infarction: Secondary | ICD-10-CM | POA: Diagnosis not present

## 2020-01-17 DIAGNOSIS — N189 Chronic kidney disease, unspecified: Secondary | ICD-10-CM | POA: Diagnosis not present

## 2020-01-17 DIAGNOSIS — E782 Mixed hyperlipidemia: Secondary | ICD-10-CM | POA: Diagnosis not present

## 2020-01-17 DIAGNOSIS — M7741 Metatarsalgia, right foot: Secondary | ICD-10-CM | POA: Diagnosis not present

## 2020-01-17 DIAGNOSIS — I951 Orthostatic hypotension: Secondary | ICD-10-CM | POA: Diagnosis not present

## 2020-01-17 DIAGNOSIS — M109 Gout, unspecified: Secondary | ICD-10-CM | POA: Diagnosis not present

## 2020-01-17 DIAGNOSIS — M150 Primary generalized (osteo)arthritis: Secondary | ICD-10-CM | POA: Diagnosis not present

## 2020-01-17 DIAGNOSIS — I959 Hypotension, unspecified: Secondary | ICD-10-CM | POA: Diagnosis not present

## 2020-01-17 DIAGNOSIS — R2689 Other abnormalities of gait and mobility: Secondary | ICD-10-CM | POA: Diagnosis not present

## 2020-01-17 DIAGNOSIS — M21622 Bunionette of left foot: Secondary | ICD-10-CM | POA: Diagnosis not present

## 2020-01-17 DIAGNOSIS — Z8546 Personal history of malignant neoplasm of prostate: Secondary | ICD-10-CM | POA: Diagnosis not present

## 2020-01-17 DIAGNOSIS — N390 Urinary tract infection, site not specified: Secondary | ICD-10-CM | POA: Diagnosis not present

## 2020-01-17 DIAGNOSIS — M2569 Stiffness of other specified joint, not elsewhere classified: Secondary | ICD-10-CM | POA: Diagnosis not present

## 2020-01-17 DIAGNOSIS — M79641 Pain in right hand: Secondary | ICD-10-CM | POA: Diagnosis not present

## 2020-01-17 DIAGNOSIS — Z951 Presence of aortocoronary bypass graft: Secondary | ICD-10-CM | POA: Diagnosis not present

## 2020-01-17 DIAGNOSIS — Z6836 Body mass index (BMI) 36.0-36.9, adult: Secondary | ICD-10-CM | POA: Diagnosis not present

## 2020-01-17 DIAGNOSIS — E663 Overweight: Secondary | ICD-10-CM | POA: Diagnosis not present

## 2020-01-17 DIAGNOSIS — E7800 Pure hypercholesterolemia, unspecified: Secondary | ICD-10-CM | POA: Diagnosis not present

## 2020-01-17 DIAGNOSIS — I739 Peripheral vascular disease, unspecified: Secondary | ICD-10-CM | POA: Diagnosis not present

## 2020-01-17 DIAGNOSIS — Z794 Long term (current) use of insulin: Secondary | ICD-10-CM | POA: Diagnosis not present

## 2020-01-17 DIAGNOSIS — F640 Transsexualism: Secondary | ICD-10-CM | POA: Diagnosis not present

## 2020-01-17 DIAGNOSIS — M546 Pain in thoracic spine: Secondary | ICD-10-CM | POA: Diagnosis not present

## 2020-01-17 DIAGNOSIS — M19049 Primary osteoarthritis, unspecified hand: Secondary | ICD-10-CM | POA: Diagnosis not present

## 2020-01-17 DIAGNOSIS — I700 Atherosclerosis of aorta: Secondary | ICD-10-CM | POA: Diagnosis not present

## 2020-01-17 DIAGNOSIS — K651 Peritoneal abscess: Secondary | ICD-10-CM | POA: Diagnosis not present

## 2020-01-17 DIAGNOSIS — Z87898 Personal history of other specified conditions: Secondary | ICD-10-CM | POA: Diagnosis not present

## 2020-01-17 DIAGNOSIS — R1310 Dysphagia, unspecified: Secondary | ICD-10-CM | POA: Diagnosis not present

## 2020-01-17 DIAGNOSIS — R778 Other specified abnormalities of plasma proteins: Secondary | ICD-10-CM | POA: Diagnosis not present

## 2020-01-17 DIAGNOSIS — M9901 Segmental and somatic dysfunction of cervical region: Secondary | ICD-10-CM | POA: Diagnosis not present

## 2020-01-17 DIAGNOSIS — R471 Dysarthria and anarthria: Secondary | ICD-10-CM | POA: Diagnosis not present

## 2020-01-17 DIAGNOSIS — Z9114 Patient's other noncompliance with medication regimen: Secondary | ICD-10-CM | POA: Diagnosis not present

## 2020-01-17 DIAGNOSIS — M79672 Pain in left foot: Secondary | ICD-10-CM | POA: Diagnosis not present

## 2020-01-17 DIAGNOSIS — R5381 Other malaise: Secondary | ICD-10-CM | POA: Diagnosis not present

## 2020-01-17 DIAGNOSIS — M25631 Stiffness of right wrist, not elsewhere classified: Secondary | ICD-10-CM | POA: Diagnosis not present

## 2020-01-17 DIAGNOSIS — L03116 Cellulitis of left lower limb: Secondary | ICD-10-CM | POA: Diagnosis not present

## 2020-01-17 DIAGNOSIS — R64 Cachexia: Secondary | ICD-10-CM | POA: Diagnosis not present

## 2020-01-17 DIAGNOSIS — N8111 Cystocele, midline: Secondary | ICD-10-CM | POA: Diagnosis not present

## 2020-01-17 DIAGNOSIS — S72141D Displaced intertrochanteric fracture of right femur, subsequent encounter for closed fracture with routine healing: Secondary | ICD-10-CM | POA: Diagnosis not present

## 2020-01-17 DIAGNOSIS — S32010D Wedge compression fracture of first lumbar vertebra, subsequent encounter for fracture with routine healing: Secondary | ICD-10-CM | POA: Diagnosis not present

## 2020-01-17 DIAGNOSIS — I2510 Atherosclerotic heart disease of native coronary artery without angina pectoris: Secondary | ICD-10-CM | POA: Diagnosis not present

## 2020-01-17 DIAGNOSIS — C7A090 Malignant carcinoid tumor of the bronchus and lung: Secondary | ICD-10-CM | POA: Diagnosis not present

## 2020-01-17 DIAGNOSIS — M5116 Intervertebral disc disorders with radiculopathy, lumbar region: Secondary | ICD-10-CM | POA: Diagnosis not present

## 2020-01-17 DIAGNOSIS — Z7901 Long term (current) use of anticoagulants: Secondary | ICD-10-CM | POA: Diagnosis not present

## 2020-01-17 DIAGNOSIS — R42 Dizziness and giddiness: Secondary | ICD-10-CM | POA: Diagnosis not present

## 2020-01-17 DIAGNOSIS — R002 Palpitations: Secondary | ICD-10-CM | POA: Diagnosis not present

## 2020-01-17 DIAGNOSIS — Z6829 Body mass index (BMI) 29.0-29.9, adult: Secondary | ICD-10-CM | POA: Diagnosis not present

## 2020-01-17 DIAGNOSIS — Z954 Presence of other heart-valve replacement: Secondary | ICD-10-CM | POA: Diagnosis not present

## 2020-01-17 DIAGNOSIS — E11319 Type 2 diabetes mellitus with unspecified diabetic retinopathy without macular edema: Secondary | ICD-10-CM | POA: Diagnosis not present

## 2020-01-17 DIAGNOSIS — Z79899 Other long term (current) drug therapy: Secondary | ICD-10-CM | POA: Diagnosis not present

## 2020-01-17 DIAGNOSIS — Z950 Presence of cardiac pacemaker: Secondary | ICD-10-CM | POA: Diagnosis not present

## 2020-01-17 DIAGNOSIS — I6523 Occlusion and stenosis of bilateral carotid arteries: Secondary | ICD-10-CM | POA: Diagnosis not present

## 2020-01-17 DIAGNOSIS — L98411 Non-pressure chronic ulcer of buttock limited to breakdown of skin: Secondary | ICD-10-CM | POA: Diagnosis not present

## 2020-01-17 DIAGNOSIS — I5021 Acute systolic (congestive) heart failure: Secondary | ICD-10-CM | POA: Diagnosis not present

## 2020-01-17 DIAGNOSIS — L2089 Other atopic dermatitis: Secondary | ICD-10-CM | POA: Diagnosis not present

## 2020-01-17 DIAGNOSIS — E119 Type 2 diabetes mellitus without complications: Secondary | ICD-10-CM | POA: Diagnosis not present

## 2020-01-17 DIAGNOSIS — R110 Nausea: Secondary | ICD-10-CM | POA: Diagnosis not present

## 2020-01-17 DIAGNOSIS — M1612 Unilateral primary osteoarthritis, left hip: Secondary | ICD-10-CM | POA: Diagnosis not present

## 2020-01-17 DIAGNOSIS — S0101XD Laceration without foreign body of scalp, subsequent encounter: Secondary | ICD-10-CM | POA: Diagnosis not present

## 2020-01-17 DIAGNOSIS — M779 Enthesopathy, unspecified: Secondary | ICD-10-CM | POA: Diagnosis not present

## 2020-01-17 DIAGNOSIS — F329 Major depressive disorder, single episode, unspecified: Secondary | ICD-10-CM | POA: Diagnosis not present

## 2020-01-17 DIAGNOSIS — N1832 Chronic kidney disease, stage 3b: Secondary | ICD-10-CM | POA: Diagnosis not present

## 2020-01-17 DIAGNOSIS — M5126 Other intervertebral disc displacement, lumbar region: Secondary | ICD-10-CM | POA: Diagnosis not present

## 2020-01-17 DIAGNOSIS — F319 Bipolar disorder, unspecified: Secondary | ICD-10-CM | POA: Diagnosis not present

## 2020-01-17 DIAGNOSIS — M5442 Lumbago with sciatica, left side: Secondary | ICD-10-CM | POA: Diagnosis not present

## 2020-01-17 DIAGNOSIS — F0280 Dementia in other diseases classified elsewhere without behavioral disturbance: Secondary | ICD-10-CM | POA: Diagnosis not present

## 2020-01-17 DIAGNOSIS — M25552 Pain in left hip: Secondary | ICD-10-CM | POA: Diagnosis not present

## 2020-01-17 DIAGNOSIS — Z86711 Personal history of pulmonary embolism: Secondary | ICD-10-CM | POA: Diagnosis not present

## 2020-01-17 DIAGNOSIS — M25661 Stiffness of right knee, not elsewhere classified: Secondary | ICD-10-CM | POA: Diagnosis not present

## 2020-01-17 DIAGNOSIS — R7401 Elevation of levels of liver transaminase levels: Secondary | ICD-10-CM | POA: Diagnosis not present

## 2020-01-17 DIAGNOSIS — K2950 Unspecified chronic gastritis without bleeding: Secondary | ICD-10-CM | POA: Diagnosis not present

## 2020-01-17 DIAGNOSIS — G3184 Mild cognitive impairment, so stated: Secondary | ICD-10-CM | POA: Diagnosis not present

## 2020-01-17 DIAGNOSIS — D62 Acute posthemorrhagic anemia: Secondary | ICD-10-CM | POA: Diagnosis not present

## 2020-01-17 DIAGNOSIS — S32592D Other specified fracture of left pubis, subsequent encounter for fracture with routine healing: Secondary | ICD-10-CM | POA: Diagnosis not present

## 2020-01-17 DIAGNOSIS — S82025K Nondisplaced longitudinal fracture of left patella, subsequent encounter for closed fracture with nonunion: Secondary | ICD-10-CM | POA: Diagnosis not present

## 2020-01-17 DIAGNOSIS — I5022 Chronic systolic (congestive) heart failure: Secondary | ICD-10-CM | POA: Diagnosis not present

## 2020-01-17 DIAGNOSIS — I6350 Cerebral infarction due to unspecified occlusion or stenosis of unspecified cerebral artery: Secondary | ICD-10-CM | POA: Diagnosis not present

## 2020-01-17 DIAGNOSIS — G96819 Other intracranial hypotension: Secondary | ICD-10-CM | POA: Diagnosis not present

## 2020-01-17 DIAGNOSIS — R6889 Other general symptoms and signs: Secondary | ICD-10-CM | POA: Diagnosis not present

## 2020-01-17 DIAGNOSIS — Z4801 Encounter for change or removal of surgical wound dressing: Secondary | ICD-10-CM | POA: Diagnosis not present

## 2020-01-17 DIAGNOSIS — B351 Tinea unguium: Secondary | ICD-10-CM | POA: Diagnosis not present

## 2020-01-17 DIAGNOSIS — Z7401 Bed confinement status: Secondary | ICD-10-CM | POA: Diagnosis not present

## 2020-01-17 DIAGNOSIS — I69959 Hemiplegia and hemiparesis following unspecified cerebrovascular disease affecting unspecified side: Secondary | ICD-10-CM | POA: Diagnosis not present

## 2020-01-17 DIAGNOSIS — C3432 Malignant neoplasm of lower lobe, left bronchus or lung: Secondary | ICD-10-CM | POA: Diagnosis not present

## 2020-01-17 DIAGNOSIS — M25551 Pain in right hip: Secondary | ICD-10-CM | POA: Diagnosis not present

## 2020-01-17 DIAGNOSIS — R41840 Attention and concentration deficit: Secondary | ICD-10-CM | POA: Diagnosis not present

## 2020-01-17 DIAGNOSIS — F418 Other specified anxiety disorders: Secondary | ICD-10-CM | POA: Diagnosis not present

## 2020-01-17 DIAGNOSIS — Z992 Dependence on renal dialysis: Secondary | ICD-10-CM | POA: Diagnosis not present

## 2020-01-17 DIAGNOSIS — J45909 Unspecified asthma, uncomplicated: Secondary | ICD-10-CM | POA: Diagnosis not present

## 2020-01-17 DIAGNOSIS — D509 Iron deficiency anemia, unspecified: Secondary | ICD-10-CM | POA: Diagnosis not present

## 2020-01-17 DIAGNOSIS — G959 Disease of spinal cord, unspecified: Secondary | ICD-10-CM | POA: Diagnosis not present

## 2020-01-17 DIAGNOSIS — W19XXXS Unspecified fall, sequela: Secondary | ICD-10-CM | POA: Diagnosis not present

## 2020-01-17 DIAGNOSIS — J208 Acute bronchitis due to other specified organisms: Secondary | ICD-10-CM | POA: Diagnosis not present

## 2020-01-17 DIAGNOSIS — D649 Anemia, unspecified: Secondary | ICD-10-CM | POA: Diagnosis not present

## 2020-01-17 DIAGNOSIS — M549 Dorsalgia, unspecified: Secondary | ICD-10-CM | POA: Diagnosis not present

## 2020-01-17 DIAGNOSIS — E872 Acidosis: Secondary | ICD-10-CM | POA: Diagnosis not present

## 2020-01-17 DIAGNOSIS — S2241XD Multiple fractures of ribs, right side, subsequent encounter for fracture with routine healing: Secondary | ICD-10-CM | POA: Diagnosis not present

## 2020-01-17 DIAGNOSIS — R251 Tremor, unspecified: Secondary | ICD-10-CM | POA: Diagnosis not present

## 2020-01-17 DIAGNOSIS — F411 Generalized anxiety disorder: Secondary | ICD-10-CM | POA: Diagnosis not present

## 2020-01-17 DIAGNOSIS — E1122 Type 2 diabetes mellitus with diabetic chronic kidney disease: Secondary | ICD-10-CM | POA: Diagnosis not present

## 2020-01-17 DIAGNOSIS — R1312 Dysphagia, oropharyngeal phase: Secondary | ICD-10-CM | POA: Diagnosis not present

## 2020-01-17 DIAGNOSIS — A419 Sepsis, unspecified organism: Secondary | ICD-10-CM | POA: Diagnosis not present

## 2020-01-17 DIAGNOSIS — Z8673 Personal history of transient ischemic attack (TIA), and cerebral infarction without residual deficits: Secondary | ICD-10-CM | POA: Diagnosis not present

## 2020-01-17 DIAGNOSIS — E785 Hyperlipidemia, unspecified: Secondary | ICD-10-CM | POA: Diagnosis not present

## 2020-01-17 DIAGNOSIS — T402X5D Adverse effect of other opioids, subsequent encounter: Secondary | ICD-10-CM | POA: Diagnosis not present

## 2020-01-17 DIAGNOSIS — R443 Hallucinations, unspecified: Secondary | ICD-10-CM | POA: Diagnosis not present

## 2020-01-17 DIAGNOSIS — E11621 Type 2 diabetes mellitus with foot ulcer: Secondary | ICD-10-CM | POA: Diagnosis not present

## 2020-01-17 DIAGNOSIS — J440 Chronic obstructive pulmonary disease with acute lower respiratory infection: Secondary | ICD-10-CM | POA: Diagnosis not present

## 2020-01-17 DIAGNOSIS — G8929 Other chronic pain: Secondary | ICD-10-CM | POA: Diagnosis not present

## 2020-01-17 DIAGNOSIS — E876 Hypokalemia: Secondary | ICD-10-CM | POA: Diagnosis not present

## 2020-01-17 DIAGNOSIS — J1282 Pneumonia due to coronavirus disease 2019: Secondary | ICD-10-CM | POA: Diagnosis not present

## 2020-01-17 DIAGNOSIS — I824Y9 Acute embolism and thrombosis of unspecified deep veins of unspecified proximal lower extremity: Secondary | ICD-10-CM | POA: Diagnosis not present

## 2020-01-17 DIAGNOSIS — Z9981 Dependence on supplemental oxygen: Secondary | ICD-10-CM | POA: Diagnosis not present

## 2020-01-17 DIAGNOSIS — Z125 Encounter for screening for malignant neoplasm of prostate: Secondary | ICD-10-CM | POA: Diagnosis not present

## 2020-01-17 DIAGNOSIS — M069 Rheumatoid arthritis, unspecified: Secondary | ICD-10-CM | POA: Diagnosis not present

## 2020-01-17 DIAGNOSIS — I4891 Unspecified atrial fibrillation: Secondary | ICD-10-CM | POA: Diagnosis not present

## 2020-01-17 DIAGNOSIS — G629 Polyneuropathy, unspecified: Secondary | ICD-10-CM | POA: Diagnosis not present

## 2020-01-17 DIAGNOSIS — M62830 Muscle spasm of back: Secondary | ICD-10-CM | POA: Diagnosis not present

## 2020-01-17 DIAGNOSIS — I69351 Hemiplegia and hemiparesis following cerebral infarction affecting right dominant side: Secondary | ICD-10-CM | POA: Diagnosis not present

## 2020-01-17 DIAGNOSIS — R21 Rash and other nonspecific skin eruption: Secondary | ICD-10-CM | POA: Diagnosis not present

## 2020-01-17 DIAGNOSIS — J90 Pleural effusion, not elsewhere classified: Secondary | ICD-10-CM | POA: Diagnosis not present

## 2020-01-17 DIAGNOSIS — J9611 Chronic respiratory failure with hypoxia: Secondary | ICD-10-CM | POA: Diagnosis not present

## 2020-01-17 DIAGNOSIS — Z85038 Personal history of other malignant neoplasm of large intestine: Secondary | ICD-10-CM | POA: Diagnosis not present

## 2020-01-17 DIAGNOSIS — N399 Disorder of urinary system, unspecified: Secondary | ICD-10-CM | POA: Diagnosis not present

## 2020-01-17 DIAGNOSIS — R278 Other lack of coordination: Secondary | ICD-10-CM | POA: Diagnosis not present

## 2020-01-17 DIAGNOSIS — H8112 Benign paroxysmal vertigo, left ear: Secondary | ICD-10-CM | POA: Diagnosis not present

## 2020-01-17 DIAGNOSIS — C44622 Squamous cell carcinoma of skin of right upper limb, including shoulder: Secondary | ICD-10-CM | POA: Diagnosis not present

## 2020-01-17 DIAGNOSIS — W540XXA Bitten by dog, initial encounter: Secondary | ICD-10-CM | POA: Diagnosis not present

## 2020-01-17 DIAGNOSIS — E669 Obesity, unspecified: Secondary | ICD-10-CM | POA: Diagnosis not present

## 2020-01-17 DIAGNOSIS — I5030 Unspecified diastolic (congestive) heart failure: Secondary | ICD-10-CM | POA: Diagnosis not present

## 2020-01-17 DIAGNOSIS — I82452 Acute embolism and thrombosis of left peroneal vein: Secondary | ICD-10-CM | POA: Diagnosis not present

## 2020-01-17 DIAGNOSIS — R41841 Cognitive communication deficit: Secondary | ICD-10-CM | POA: Diagnosis not present

## 2020-01-17 DIAGNOSIS — M6281 Muscle weakness (generalized): Secondary | ICD-10-CM | POA: Diagnosis not present

## 2020-01-17 DIAGNOSIS — M810 Age-related osteoporosis without current pathological fracture: Secondary | ICD-10-CM | POA: Diagnosis not present

## 2020-01-17 DIAGNOSIS — J4520 Mild intermittent asthma, uncomplicated: Secondary | ICD-10-CM | POA: Diagnosis not present

## 2020-01-17 DIAGNOSIS — E860 Dehydration: Secondary | ICD-10-CM | POA: Diagnosis not present

## 2020-01-17 DIAGNOSIS — R6510 Systemic inflammatory response syndrome (SIRS) of non-infectious origin without acute organ dysfunction: Secondary | ICD-10-CM | POA: Diagnosis not present

## 2020-01-17 DIAGNOSIS — R600 Localized edema: Secondary | ICD-10-CM | POA: Diagnosis not present

## 2020-01-17 DIAGNOSIS — G4733 Obstructive sleep apnea (adult) (pediatric): Secondary | ICD-10-CM | POA: Diagnosis not present

## 2020-01-17 DIAGNOSIS — L97822 Non-pressure chronic ulcer of other part of left lower leg with fat layer exposed: Secondary | ICD-10-CM | POA: Diagnosis not present

## 2020-01-17 DIAGNOSIS — K279 Peptic ulcer, site unspecified, unspecified as acute or chronic, without hemorrhage or perforation: Secondary | ICD-10-CM | POA: Diagnosis not present

## 2020-01-17 DIAGNOSIS — R350 Frequency of micturition: Secondary | ICD-10-CM | POA: Diagnosis not present

## 2020-01-17 DIAGNOSIS — R7989 Other specified abnormal findings of blood chemistry: Secondary | ICD-10-CM | POA: Diagnosis not present

## 2020-01-17 DIAGNOSIS — L89620 Pressure ulcer of left heel, unstageable: Secondary | ICD-10-CM | POA: Diagnosis not present

## 2020-01-17 DIAGNOSIS — Z5181 Encounter for therapeutic drug level monitoring: Secondary | ICD-10-CM | POA: Diagnosis not present

## 2020-01-17 DIAGNOSIS — E875 Hyperkalemia: Secondary | ICD-10-CM | POA: Diagnosis not present

## 2020-01-17 DIAGNOSIS — D638 Anemia in other chronic diseases classified elsewhere: Secondary | ICD-10-CM | POA: Diagnosis not present

## 2020-01-17 DIAGNOSIS — R293 Abnormal posture: Secondary | ICD-10-CM | POA: Diagnosis not present

## 2020-01-17 DIAGNOSIS — M7062 Trochanteric bursitis, left hip: Secondary | ICD-10-CM | POA: Diagnosis not present

## 2020-01-17 DIAGNOSIS — M25561 Pain in right knee: Secondary | ICD-10-CM | POA: Diagnosis not present

## 2020-01-17 DIAGNOSIS — W19XXXD Unspecified fall, subsequent encounter: Secondary | ICD-10-CM | POA: Diagnosis not present

## 2020-01-17 DIAGNOSIS — I77819 Aortic ectasia, unspecified site: Secondary | ICD-10-CM | POA: Diagnosis not present

## 2020-01-17 DIAGNOSIS — K219 Gastro-esophageal reflux disease without esophagitis: Secondary | ICD-10-CM | POA: Diagnosis not present

## 2020-01-17 DIAGNOSIS — Z791 Long term (current) use of non-steroidal anti-inflammatories (NSAID): Secondary | ICD-10-CM | POA: Diagnosis not present

## 2020-01-17 DIAGNOSIS — I259 Chronic ischemic heart disease, unspecified: Secondary | ICD-10-CM | POA: Diagnosis not present

## 2020-01-17 DIAGNOSIS — E7849 Other hyperlipidemia: Secondary | ICD-10-CM | POA: Diagnosis not present

## 2020-01-17 DIAGNOSIS — C641 Malignant neoplasm of right kidney, except renal pelvis: Secondary | ICD-10-CM | POA: Diagnosis not present

## 2020-01-17 DIAGNOSIS — M19041 Primary osteoarthritis, right hand: Secondary | ICD-10-CM | POA: Diagnosis not present

## 2020-01-17 DIAGNOSIS — J9610 Chronic respiratory failure, unspecified whether with hypoxia or hypercapnia: Secondary | ICD-10-CM | POA: Diagnosis not present

## 2020-01-17 DIAGNOSIS — R2681 Unsteadiness on feet: Secondary | ICD-10-CM | POA: Diagnosis not present

## 2020-01-17 DIAGNOSIS — S81802D Unspecified open wound, left lower leg, subsequent encounter: Secondary | ICD-10-CM | POA: Diagnosis not present

## 2020-01-17 DIAGNOSIS — R197 Diarrhea, unspecified: Secondary | ICD-10-CM | POA: Diagnosis not present

## 2020-01-17 DIAGNOSIS — I5043 Acute on chronic combined systolic (congestive) and diastolic (congestive) heart failure: Secondary | ICD-10-CM | POA: Diagnosis not present

## 2020-01-17 DIAGNOSIS — R1311 Dysphagia, oral phase: Secondary | ICD-10-CM | POA: Diagnosis not present

## 2020-01-17 DIAGNOSIS — G3183 Dementia with Lewy bodies: Secondary | ICD-10-CM | POA: Diagnosis not present

## 2020-01-17 DIAGNOSIS — R339 Retention of urine, unspecified: Secondary | ICD-10-CM | POA: Diagnosis not present

## 2020-01-17 DIAGNOSIS — R0602 Shortness of breath: Secondary | ICD-10-CM | POA: Diagnosis not present

## 2020-01-17 DIAGNOSIS — J301 Allergic rhinitis due to pollen: Secondary | ICD-10-CM | POA: Diagnosis not present

## 2020-01-17 DIAGNOSIS — Z6823 Body mass index (BMI) 23.0-23.9, adult: Secondary | ICD-10-CM | POA: Diagnosis not present

## 2020-01-17 DIAGNOSIS — I5042 Chronic combined systolic (congestive) and diastolic (congestive) heart failure: Secondary | ICD-10-CM | POA: Diagnosis not present

## 2020-01-17 DIAGNOSIS — I651 Occlusion and stenosis of basilar artery: Secondary | ICD-10-CM | POA: Diagnosis not present

## 2020-01-17 DIAGNOSIS — M5136 Other intervertebral disc degeneration, lumbar region: Secondary | ICD-10-CM | POA: Diagnosis not present

## 2020-01-17 DIAGNOSIS — D72829 Elevated white blood cell count, unspecified: Secondary | ICD-10-CM | POA: Diagnosis not present

## 2020-01-17 DIAGNOSIS — Z5189 Encounter for other specified aftercare: Secondary | ICD-10-CM | POA: Diagnosis not present

## 2020-01-17 DIAGNOSIS — F17210 Nicotine dependence, cigarettes, uncomplicated: Secondary | ICD-10-CM | POA: Diagnosis not present

## 2020-01-17 DIAGNOSIS — M9903 Segmental and somatic dysfunction of lumbar region: Secondary | ICD-10-CM | POA: Diagnosis not present

## 2020-01-17 DIAGNOSIS — M5137 Other intervertebral disc degeneration, lumbosacral region: Secondary | ICD-10-CM | POA: Diagnosis not present

## 2020-01-17 DIAGNOSIS — H9190 Unspecified hearing loss, unspecified ear: Secondary | ICD-10-CM | POA: Diagnosis not present

## 2020-01-17 DIAGNOSIS — J431 Panlobular emphysema: Secondary | ICD-10-CM | POA: Diagnosis not present

## 2020-01-17 DIAGNOSIS — A499 Bacterial infection, unspecified: Secondary | ICD-10-CM | POA: Diagnosis not present

## 2020-01-17 DIAGNOSIS — R413 Other amnesia: Secondary | ICD-10-CM | POA: Diagnosis not present

## 2020-01-17 DIAGNOSIS — N182 Chronic kidney disease, stage 2 (mild): Secondary | ICD-10-CM | POA: Diagnosis not present

## 2020-01-17 DIAGNOSIS — G2581 Restless legs syndrome: Secondary | ICD-10-CM | POA: Diagnosis not present

## 2020-01-17 DIAGNOSIS — N200 Calculus of kidney: Secondary | ICD-10-CM | POA: Diagnosis not present

## 2020-01-17 DIAGNOSIS — M86172 Other acute osteomyelitis, left ankle and foot: Secondary | ICD-10-CM | POA: Diagnosis not present

## 2020-01-17 DIAGNOSIS — N186 End stage renal disease: Secondary | ICD-10-CM | POA: Diagnosis not present

## 2020-01-17 DIAGNOSIS — Z955 Presence of coronary angioplasty implant and graft: Secondary | ICD-10-CM | POA: Diagnosis not present

## 2020-01-17 DIAGNOSIS — I255 Ischemic cardiomyopathy: Secondary | ICD-10-CM | POA: Diagnosis not present

## 2020-01-17 DIAGNOSIS — M9904 Segmental and somatic dysfunction of sacral region: Secondary | ICD-10-CM | POA: Diagnosis not present

## 2020-01-17 DIAGNOSIS — Z86718 Personal history of other venous thrombosis and embolism: Secondary | ICD-10-CM | POA: Diagnosis not present

## 2020-01-17 DIAGNOSIS — Z96651 Presence of right artificial knee joint: Secondary | ICD-10-CM | POA: Diagnosis not present

## 2020-01-17 DIAGNOSIS — M2550 Pain in unspecified joint: Secondary | ICD-10-CM | POA: Diagnosis not present

## 2020-01-17 DIAGNOSIS — I7300 Raynaud's syndrome without gangrene: Secondary | ICD-10-CM | POA: Diagnosis not present

## 2020-01-17 DIAGNOSIS — E1165 Type 2 diabetes mellitus with hyperglycemia: Secondary | ICD-10-CM | POA: Diagnosis not present

## 2020-01-17 DIAGNOSIS — M159 Polyosteoarthritis, unspecified: Secondary | ICD-10-CM | POA: Diagnosis not present

## 2020-01-17 DIAGNOSIS — L97812 Non-pressure chronic ulcer of other part of right lower leg with fat layer exposed: Secondary | ICD-10-CM | POA: Diagnosis not present

## 2020-01-17 DIAGNOSIS — C61 Malignant neoplasm of prostate: Secondary | ICD-10-CM | POA: Diagnosis not present

## 2020-01-17 DIAGNOSIS — K5790 Diverticulosis of intestine, part unspecified, without perforation or abscess without bleeding: Secondary | ICD-10-CM | POA: Diagnosis not present

## 2020-01-17 DIAGNOSIS — M2042 Other hammer toe(s) (acquired), left foot: Secondary | ICD-10-CM | POA: Diagnosis not present

## 2020-01-17 DIAGNOSIS — J449 Chronic obstructive pulmonary disease, unspecified: Secondary | ICD-10-CM | POA: Diagnosis not present

## 2020-01-17 DIAGNOSIS — M7712 Lateral epicondylitis, left elbow: Secondary | ICD-10-CM | POA: Diagnosis not present

## 2020-01-17 DIAGNOSIS — M50323 Other cervical disc degeneration at C6-C7 level: Secondary | ICD-10-CM | POA: Diagnosis not present

## 2020-01-17 DIAGNOSIS — K50014 Crohn's disease of small intestine with abscess: Secondary | ICD-10-CM | POA: Diagnosis not present

## 2020-01-17 DIAGNOSIS — L89611 Pressure ulcer of right heel, stage 1: Secondary | ICD-10-CM | POA: Diagnosis not present

## 2020-01-17 DIAGNOSIS — I129 Hypertensive chronic kidney disease with stage 1 through stage 4 chronic kidney disease, or unspecified chronic kidney disease: Secondary | ICD-10-CM | POA: Diagnosis not present

## 2020-01-17 DIAGNOSIS — M9902 Segmental and somatic dysfunction of thoracic region: Secondary | ICD-10-CM | POA: Diagnosis not present

## 2020-01-17 DIAGNOSIS — M1732 Unilateral post-traumatic osteoarthritis, left knee: Secondary | ICD-10-CM | POA: Diagnosis not present

## 2020-01-17 DIAGNOSIS — Z466 Encounter for fitting and adjustment of urinary device: Secondary | ICD-10-CM | POA: Diagnosis not present

## 2020-01-17 DIAGNOSIS — I69828 Other speech and language deficits following other cerebrovascular disease: Secondary | ICD-10-CM | POA: Diagnosis not present

## 2020-01-17 DIAGNOSIS — C8510 Unspecified B-cell lymphoma, unspecified site: Secondary | ICD-10-CM | POA: Diagnosis not present

## 2020-01-17 DIAGNOSIS — I252 Old myocardial infarction: Secondary | ICD-10-CM | POA: Diagnosis not present

## 2020-01-17 DIAGNOSIS — M7742 Metatarsalgia, left foot: Secondary | ICD-10-CM | POA: Diagnosis not present

## 2020-01-17 DIAGNOSIS — M1990 Unspecified osteoarthritis, unspecified site: Secondary | ICD-10-CM | POA: Diagnosis not present

## 2020-01-17 DIAGNOSIS — F5101 Primary insomnia: Secondary | ICD-10-CM | POA: Diagnosis not present

## 2020-01-17 DIAGNOSIS — R55 Syncope and collapse: Secondary | ICD-10-CM | POA: Diagnosis not present

## 2020-01-17 DIAGNOSIS — I480 Paroxysmal atrial fibrillation: Secondary | ICD-10-CM | POA: Diagnosis not present

## 2020-01-17 DIAGNOSIS — M25652 Stiffness of left hip, not elsewhere classified: Secondary | ICD-10-CM | POA: Diagnosis not present

## 2020-01-17 DIAGNOSIS — L89152 Pressure ulcer of sacral region, stage 2: Secondary | ICD-10-CM | POA: Diagnosis not present

## 2020-01-17 DIAGNOSIS — Z96642 Presence of left artificial hip joint: Secondary | ICD-10-CM | POA: Diagnosis not present

## 2020-01-17 DIAGNOSIS — I872 Venous insufficiency (chronic) (peripheral): Secondary | ICD-10-CM | POA: Diagnosis not present

## 2020-01-17 DIAGNOSIS — H409 Unspecified glaucoma: Secondary | ICD-10-CM | POA: Diagnosis not present

## 2020-01-17 DIAGNOSIS — K56609 Unspecified intestinal obstruction, unspecified as to partial versus complete obstruction: Secondary | ICD-10-CM | POA: Diagnosis not present

## 2020-01-17 DIAGNOSIS — Z8616 Personal history of COVID-19: Secondary | ICD-10-CM | POA: Diagnosis not present

## 2020-01-17 DIAGNOSIS — N400 Enlarged prostate without lower urinary tract symptoms: Secondary | ICD-10-CM | POA: Diagnosis not present

## 2020-01-17 DIAGNOSIS — Z5111 Encounter for antineoplastic chemotherapy: Secondary | ICD-10-CM | POA: Diagnosis not present

## 2020-01-17 DIAGNOSIS — K222 Esophageal obstruction: Secondary | ICD-10-CM | POA: Diagnosis not present

## 2020-01-17 DIAGNOSIS — M79602 Pain in left arm: Secondary | ICD-10-CM | POA: Diagnosis not present

## 2020-01-17 DIAGNOSIS — G309 Alzheimer's disease, unspecified: Secondary | ICD-10-CM | POA: Diagnosis not present

## 2020-01-17 DIAGNOSIS — R499 Unspecified voice and resonance disorder: Secondary | ICD-10-CM | POA: Diagnosis not present

## 2020-01-17 DIAGNOSIS — C4922 Malignant neoplasm of connective and soft tissue of left lower limb, including hip: Secondary | ICD-10-CM | POA: Diagnosis not present

## 2020-01-17 DIAGNOSIS — G20 Parkinson's disease: Secondary | ICD-10-CM | POA: Diagnosis not present

## 2020-01-17 DIAGNOSIS — N183 Chronic kidney disease, stage 3 unspecified: Secondary | ICD-10-CM | POA: Diagnosis not present

## 2020-01-17 DIAGNOSIS — L97412 Non-pressure chronic ulcer of right heel and midfoot with fat layer exposed: Secondary | ICD-10-CM | POA: Diagnosis not present

## 2020-01-17 DIAGNOSIS — I70219 Atherosclerosis of native arteries of extremities with intermittent claudication, unspecified extremity: Secondary | ICD-10-CM | POA: Diagnosis not present

## 2020-01-17 DIAGNOSIS — Z743 Need for continuous supervision: Secondary | ICD-10-CM | POA: Diagnosis not present

## 2020-01-17 DIAGNOSIS — M5413 Radiculopathy, cervicothoracic region: Secondary | ICD-10-CM | POA: Diagnosis not present

## 2020-01-17 DIAGNOSIS — Z66 Do not resuscitate: Secondary | ICD-10-CM | POA: Diagnosis not present

## 2020-01-17 DIAGNOSIS — R262 Difficulty in walking, not elsewhere classified: Secondary | ICD-10-CM | POA: Diagnosis not present

## 2020-01-17 DIAGNOSIS — U071 COVID-19: Secondary | ICD-10-CM | POA: Diagnosis not present

## 2020-01-17 DIAGNOSIS — R0789 Other chest pain: Secondary | ICD-10-CM | POA: Diagnosis not present

## 2020-01-17 DIAGNOSIS — I5033 Acute on chronic diastolic (congestive) heart failure: Secondary | ICD-10-CM | POA: Diagnosis not present

## 2020-01-17 DIAGNOSIS — M5416 Radiculopathy, lumbar region: Secondary | ICD-10-CM | POA: Diagnosis not present

## 2020-01-17 DIAGNOSIS — R238 Other skin changes: Secondary | ICD-10-CM | POA: Diagnosis not present

## 2020-01-17 DIAGNOSIS — J188 Other pneumonia, unspecified organism: Secondary | ICD-10-CM | POA: Diagnosis not present

## 2020-01-17 DIAGNOSIS — D500 Iron deficiency anemia secondary to blood loss (chronic): Secondary | ICD-10-CM | POA: Diagnosis not present

## 2020-01-17 DIAGNOSIS — G8101 Flaccid hemiplegia affecting right dominant side: Secondary | ICD-10-CM | POA: Diagnosis not present

## 2020-01-17 DIAGNOSIS — E6609 Other obesity due to excess calories: Secondary | ICD-10-CM | POA: Diagnosis not present

## 2020-01-17 DIAGNOSIS — C50811 Malignant neoplasm of overlapping sites of right female breast: Secondary | ICD-10-CM | POA: Diagnosis not present

## 2020-01-17 DIAGNOSIS — R296 Repeated falls: Secondary | ICD-10-CM | POA: Diagnosis not present

## 2020-01-17 DIAGNOSIS — Z48815 Encounter for surgical aftercare following surgery on the digestive system: Secondary | ICD-10-CM | POA: Diagnosis not present

## 2020-01-17 DIAGNOSIS — L84 Corns and callosities: Secondary | ICD-10-CM | POA: Diagnosis not present

## 2020-01-17 DIAGNOSIS — C160 Malignant neoplasm of cardia: Secondary | ICD-10-CM | POA: Diagnosis not present

## 2020-01-17 DIAGNOSIS — F0151 Vascular dementia with behavioral disturbance: Secondary | ICD-10-CM | POA: Diagnosis not present

## 2020-01-17 DIAGNOSIS — I495 Sick sinus syndrome: Secondary | ICD-10-CM | POA: Diagnosis not present

## 2020-01-17 DIAGNOSIS — J4551 Severe persistent asthma with (acute) exacerbation: Secondary | ICD-10-CM | POA: Diagnosis not present

## 2020-01-17 DIAGNOSIS — E1140 Type 2 diabetes mellitus with diabetic neuropathy, unspecified: Secondary | ICD-10-CM | POA: Diagnosis not present

## 2020-01-17 DIAGNOSIS — Z0001 Encounter for general adult medical examination with abnormal findings: Secondary | ICD-10-CM | POA: Diagnosis not present

## 2020-01-17 DIAGNOSIS — N319 Neuromuscular dysfunction of bladder, unspecified: Secondary | ICD-10-CM | POA: Diagnosis not present

## 2020-01-17 DIAGNOSIS — J9601 Acute respiratory failure with hypoxia: Secondary | ICD-10-CM | POA: Diagnosis not present

## 2020-01-17 DIAGNOSIS — I110 Hypertensive heart disease with heart failure: Secondary | ICD-10-CM | POA: Diagnosis not present

## 2020-01-17 DIAGNOSIS — R58 Hemorrhage, not elsewhere classified: Secondary | ICD-10-CM | POA: Diagnosis not present

## 2020-01-17 DIAGNOSIS — M722 Plantar fascial fibromatosis: Secondary | ICD-10-CM | POA: Diagnosis not present

## 2020-01-17 DIAGNOSIS — N3000 Acute cystitis without hematuria: Secondary | ICD-10-CM | POA: Diagnosis not present

## 2020-01-17 DIAGNOSIS — Z87891 Personal history of nicotine dependence: Secondary | ICD-10-CM | POA: Diagnosis not present

## 2020-01-17 DIAGNOSIS — M25511 Pain in right shoulder: Secondary | ICD-10-CM | POA: Diagnosis not present

## 2020-01-17 DIAGNOSIS — Z20822 Contact with and (suspected) exposure to covid-19: Secondary | ICD-10-CM | POA: Diagnosis not present

## 2020-01-17 DIAGNOSIS — Z9181 History of falling: Secondary | ICD-10-CM | POA: Diagnosis not present

## 2020-01-17 DIAGNOSIS — C7971 Secondary malignant neoplasm of right adrenal gland: Secondary | ICD-10-CM | POA: Diagnosis not present

## 2020-01-17 DIAGNOSIS — M1711 Unilateral primary osteoarthritis, right knee: Secondary | ICD-10-CM | POA: Diagnosis not present

## 2020-01-17 DIAGNOSIS — I083 Combined rheumatic disorders of mitral, aortic and tricuspid valves: Secondary | ICD-10-CM | POA: Diagnosis not present

## 2020-01-17 DIAGNOSIS — N184 Chronic kidney disease, stage 4 (severe): Secondary | ICD-10-CM | POA: Diagnosis not present

## 2020-01-17 DIAGNOSIS — R944 Abnormal results of kidney function studies: Secondary | ICD-10-CM | POA: Diagnosis not present

## 2020-01-17 DIAGNOSIS — D631 Anemia in chronic kidney disease: Secondary | ICD-10-CM | POA: Diagnosis not present

## 2020-01-17 DIAGNOSIS — N138 Other obstructive and reflux uropathy: Secondary | ICD-10-CM | POA: Diagnosis not present

## 2020-01-17 DIAGNOSIS — E1169 Type 2 diabetes mellitus with other specified complication: Secondary | ICD-10-CM | POA: Diagnosis not present

## 2020-01-17 DIAGNOSIS — F410 Panic disorder [episodic paroxysmal anxiety] without agoraphobia: Secondary | ICD-10-CM | POA: Diagnosis not present

## 2020-01-17 DIAGNOSIS — K2270 Barrett's esophagus without dysplasia: Secondary | ICD-10-CM | POA: Diagnosis not present

## 2020-01-17 DIAGNOSIS — I87331 Chronic venous hypertension (idiopathic) with ulcer and inflammation of right lower extremity: Secondary | ICD-10-CM | POA: Diagnosis not present

## 2020-01-17 DIAGNOSIS — R1314 Dysphagia, pharyngoesophageal phase: Secondary | ICD-10-CM | POA: Diagnosis not present

## 2020-01-17 DIAGNOSIS — E559 Vitamin D deficiency, unspecified: Secondary | ICD-10-CM | POA: Diagnosis not present

## 2020-01-17 DIAGNOSIS — N3281 Overactive bladder: Secondary | ICD-10-CM | POA: Diagnosis not present

## 2020-01-17 DIAGNOSIS — F419 Anxiety disorder, unspecified: Secondary | ICD-10-CM | POA: Diagnosis not present

## 2020-01-17 DIAGNOSIS — R627 Adult failure to thrive: Secondary | ICD-10-CM | POA: Diagnosis not present

## 2020-01-17 DIAGNOSIS — F39 Unspecified mood [affective] disorder: Secondary | ICD-10-CM | POA: Diagnosis not present

## 2020-01-17 DIAGNOSIS — R531 Weakness: Secondary | ICD-10-CM | POA: Diagnosis not present

## 2020-01-17 DIAGNOSIS — M545 Low back pain: Secondary | ICD-10-CM | POA: Diagnosis not present

## 2020-01-17 DIAGNOSIS — R3914 Feeling of incomplete bladder emptying: Secondary | ICD-10-CM | POA: Diagnosis not present

## 2020-01-17 DIAGNOSIS — R5383 Other fatigue: Secondary | ICD-10-CM | POA: Diagnosis not present

## 2020-01-17 DIAGNOSIS — R29898 Other symptoms and signs involving the musculoskeletal system: Secondary | ICD-10-CM | POA: Diagnosis not present

## 2020-01-18 DIAGNOSIS — R001 Bradycardia, unspecified: Secondary | ICD-10-CM | POA: Diagnosis not present

## 2020-01-18 DIAGNOSIS — M150 Primary generalized (osteo)arthritis: Secondary | ICD-10-CM | POA: Diagnosis not present

## 2020-01-18 DIAGNOSIS — M5136 Other intervertebral disc degeneration, lumbar region: Secondary | ICD-10-CM | POA: Diagnosis not present

## 2020-01-18 DIAGNOSIS — F419 Anxiety disorder, unspecified: Secondary | ICD-10-CM | POA: Diagnosis not present

## 2020-01-18 DIAGNOSIS — D631 Anemia in chronic kidney disease: Secondary | ICD-10-CM | POA: Diagnosis not present

## 2020-01-18 DIAGNOSIS — Z806 Family history of leukemia: Secondary | ICD-10-CM | POA: Diagnosis not present

## 2020-01-18 DIAGNOSIS — L89312 Pressure ulcer of right buttock, stage 2: Secondary | ICD-10-CM | POA: Diagnosis not present

## 2020-01-18 DIAGNOSIS — Z85828 Personal history of other malignant neoplasm of skin: Secondary | ICD-10-CM | POA: Diagnosis not present

## 2020-01-18 DIAGNOSIS — Z992 Dependence on renal dialysis: Secondary | ICD-10-CM | POA: Diagnosis not present

## 2020-01-18 DIAGNOSIS — Z792 Long term (current) use of antibiotics: Secondary | ICD-10-CM | POA: Diagnosis not present

## 2020-01-18 DIAGNOSIS — U071 COVID-19: Secondary | ICD-10-CM | POA: Diagnosis not present

## 2020-01-18 DIAGNOSIS — Z961 Presence of intraocular lens: Secondary | ICD-10-CM | POA: Diagnosis not present

## 2020-01-18 DIAGNOSIS — E669 Obesity, unspecified: Secondary | ICD-10-CM | POA: Diagnosis not present

## 2020-01-18 DIAGNOSIS — G4700 Insomnia, unspecified: Secondary | ICD-10-CM | POA: Diagnosis not present

## 2020-01-18 DIAGNOSIS — J189 Pneumonia, unspecified organism: Secondary | ICD-10-CM | POA: Diagnosis not present

## 2020-01-18 DIAGNOSIS — M5442 Lumbago with sciatica, left side: Secondary | ICD-10-CM | POA: Diagnosis not present

## 2020-01-18 DIAGNOSIS — I2510 Atherosclerotic heart disease of native coronary artery without angina pectoris: Secondary | ICD-10-CM | POA: Diagnosis not present

## 2020-01-18 DIAGNOSIS — R2689 Other abnormalities of gait and mobility: Secondary | ICD-10-CM | POA: Diagnosis not present

## 2020-01-18 DIAGNOSIS — K648 Other hemorrhoids: Secondary | ICD-10-CM | POA: Diagnosis not present

## 2020-01-18 DIAGNOSIS — Z9012 Acquired absence of left breast and nipple: Secondary | ICD-10-CM | POA: Diagnosis not present

## 2020-01-18 DIAGNOSIS — D485 Neoplasm of uncertain behavior of skin: Secondary | ICD-10-CM | POA: Diagnosis not present

## 2020-01-18 DIAGNOSIS — Z5111 Encounter for antineoplastic chemotherapy: Secondary | ICD-10-CM | POA: Diagnosis not present

## 2020-01-18 DIAGNOSIS — Z853 Personal history of malignant neoplasm of breast: Secondary | ICD-10-CM | POA: Diagnosis not present

## 2020-01-18 DIAGNOSIS — Z136 Encounter for screening for cardiovascular disorders: Secondary | ICD-10-CM | POA: Diagnosis not present

## 2020-01-18 DIAGNOSIS — M4155 Other secondary scoliosis, thoracolumbar region: Secondary | ICD-10-CM | POA: Diagnosis not present

## 2020-01-18 DIAGNOSIS — E785 Hyperlipidemia, unspecified: Secondary | ICD-10-CM | POA: Diagnosis not present

## 2020-01-18 DIAGNOSIS — C9210 Chronic myeloid leukemia, BCR/ABL-positive, not having achieved remission: Secondary | ICD-10-CM | POA: Diagnosis not present

## 2020-01-18 DIAGNOSIS — N3001 Acute cystitis with hematuria: Secondary | ICD-10-CM | POA: Diagnosis not present

## 2020-01-18 DIAGNOSIS — R296 Repeated falls: Secondary | ICD-10-CM | POA: Diagnosis not present

## 2020-01-18 DIAGNOSIS — Z03818 Encounter for observation for suspected exposure to other biological agents ruled out: Secondary | ICD-10-CM | POA: Diagnosis not present

## 2020-01-18 DIAGNOSIS — R5383 Other fatigue: Secondary | ICD-10-CM | POA: Diagnosis not present

## 2020-01-18 DIAGNOSIS — M179 Osteoarthritis of knee, unspecified: Secondary | ICD-10-CM | POA: Diagnosis not present

## 2020-01-18 DIAGNOSIS — D2262 Melanocytic nevi of left upper limb, including shoulder: Secondary | ICD-10-CM | POA: Diagnosis not present

## 2020-01-18 DIAGNOSIS — M1710 Unilateral primary osteoarthritis, unspecified knee: Secondary | ICD-10-CM | POA: Diagnosis not present

## 2020-01-18 DIAGNOSIS — Z8546 Personal history of malignant neoplasm of prostate: Secondary | ICD-10-CM | POA: Diagnosis not present

## 2020-01-18 DIAGNOSIS — Z13228 Encounter for screening for other metabolic disorders: Secondary | ICD-10-CM | POA: Diagnosis not present

## 2020-01-18 DIAGNOSIS — M545 Low back pain: Secondary | ICD-10-CM | POA: Diagnosis not present

## 2020-01-18 DIAGNOSIS — N951 Menopausal and female climacteric states: Secondary | ICD-10-CM | POA: Diagnosis not present

## 2020-01-18 DIAGNOSIS — E1169 Type 2 diabetes mellitus with other specified complication: Secondary | ICD-10-CM | POA: Diagnosis not present

## 2020-01-18 DIAGNOSIS — Z803 Family history of malignant neoplasm of breast: Secondary | ICD-10-CM | POA: Diagnosis not present

## 2020-01-18 DIAGNOSIS — G301 Alzheimer's disease with late onset: Secondary | ICD-10-CM | POA: Diagnosis not present

## 2020-01-18 DIAGNOSIS — I890 Lymphedema, not elsewhere classified: Secondary | ICD-10-CM | POA: Diagnosis not present

## 2020-01-18 DIAGNOSIS — R339 Retention of urine, unspecified: Secondary | ICD-10-CM | POA: Diagnosis not present

## 2020-01-18 DIAGNOSIS — T451X5A Adverse effect of antineoplastic and immunosuppressive drugs, initial encounter: Secondary | ICD-10-CM | POA: Diagnosis not present

## 2020-01-18 DIAGNOSIS — D6481 Anemia due to antineoplastic chemotherapy: Secondary | ICD-10-CM | POA: Diagnosis not present

## 2020-01-18 DIAGNOSIS — Z7189 Other specified counseling: Secondary | ICD-10-CM | POA: Diagnosis not present

## 2020-01-18 DIAGNOSIS — K625 Hemorrhage of anus and rectum: Secondary | ICD-10-CM | POA: Diagnosis not present

## 2020-01-18 DIAGNOSIS — A419 Sepsis, unspecified organism: Secondary | ICD-10-CM | POA: Diagnosis not present

## 2020-01-18 DIAGNOSIS — M4316 Spondylolisthesis, lumbar region: Secondary | ICD-10-CM | POA: Diagnosis not present

## 2020-01-18 DIAGNOSIS — G4730 Sleep apnea, unspecified: Secondary | ICD-10-CM | POA: Diagnosis not present

## 2020-01-18 DIAGNOSIS — D508 Other iron deficiency anemias: Secondary | ICD-10-CM | POA: Diagnosis not present

## 2020-01-18 DIAGNOSIS — I69354 Hemiplegia and hemiparesis following cerebral infarction affecting left non-dominant side: Secondary | ICD-10-CM | POA: Diagnosis not present

## 2020-01-18 DIAGNOSIS — R6520 Severe sepsis without septic shock: Secondary | ICD-10-CM | POA: Diagnosis not present

## 2020-01-18 DIAGNOSIS — Z602 Problems related to living alone: Secondary | ICD-10-CM | POA: Diagnosis not present

## 2020-01-18 DIAGNOSIS — Z130 Encounter for screening for diseases of the blood and blood-forming organs and certain disorders involving the immune mechanism: Secondary | ICD-10-CM | POA: Diagnosis not present

## 2020-01-18 DIAGNOSIS — C44229 Squamous cell carcinoma of skin of left ear and external auricular canal: Secondary | ICD-10-CM | POA: Diagnosis not present

## 2020-01-18 DIAGNOSIS — N183 Chronic kidney disease, stage 3 unspecified: Secondary | ICD-10-CM | POA: Diagnosis not present

## 2020-01-18 DIAGNOSIS — T8249XA Other complication of vascular dialysis catheter, initial encounter: Secondary | ICD-10-CM | POA: Diagnosis not present

## 2020-01-18 DIAGNOSIS — N4231 Prostatic intraepithelial neoplasia: Secondary | ICD-10-CM | POA: Diagnosis not present

## 2020-01-18 DIAGNOSIS — Z86010 Personal history of colonic polyps: Secondary | ICD-10-CM | POA: Diagnosis not present

## 2020-01-18 DIAGNOSIS — R7303 Prediabetes: Secondary | ICD-10-CM | POA: Diagnosis not present

## 2020-01-18 DIAGNOSIS — G43009 Migraine without aura, not intractable, without status migrainosus: Secondary | ICD-10-CM | POA: Diagnosis not present

## 2020-01-18 DIAGNOSIS — Z1639 Resistance to other specified antimicrobial drug: Secondary | ICD-10-CM | POA: Diagnosis not present

## 2020-01-18 DIAGNOSIS — I739 Peripheral vascular disease, unspecified: Secondary | ICD-10-CM | POA: Diagnosis not present

## 2020-01-18 DIAGNOSIS — N401 Enlarged prostate with lower urinary tract symptoms: Secondary | ICD-10-CM | POA: Diagnosis not present

## 2020-01-18 DIAGNOSIS — F331 Major depressive disorder, recurrent, moderate: Secondary | ICD-10-CM | POA: Diagnosis not present

## 2020-01-18 DIAGNOSIS — M531 Cervicobrachial syndrome: Secondary | ICD-10-CM | POA: Diagnosis not present

## 2020-01-18 DIAGNOSIS — R197 Diarrhea, unspecified: Secondary | ICD-10-CM | POA: Diagnosis not present

## 2020-01-18 DIAGNOSIS — R3915 Urgency of urination: Secondary | ICD-10-CM | POA: Diagnosis not present

## 2020-01-18 DIAGNOSIS — N289 Disorder of kidney and ureter, unspecified: Secondary | ICD-10-CM | POA: Diagnosis not present

## 2020-01-18 DIAGNOSIS — I69854 Hemiplegia and hemiparesis following other cerebrovascular disease affecting left non-dominant side: Secondary | ICD-10-CM | POA: Diagnosis not present

## 2020-01-18 DIAGNOSIS — I509 Heart failure, unspecified: Secondary | ICD-10-CM | POA: Diagnosis not present

## 2020-01-18 DIAGNOSIS — N4289 Other specified disorders of prostate: Secondary | ICD-10-CM | POA: Diagnosis not present

## 2020-01-18 DIAGNOSIS — C44319 Basal cell carcinoma of skin of other parts of face: Secondary | ICD-10-CM | POA: Diagnosis not present

## 2020-01-18 DIAGNOSIS — M25562 Pain in left knee: Secondary | ICD-10-CM | POA: Diagnosis not present

## 2020-01-18 DIAGNOSIS — L918 Other hypertrophic disorders of the skin: Secondary | ICD-10-CM | POA: Diagnosis not present

## 2020-01-18 DIAGNOSIS — F3341 Major depressive disorder, recurrent, in partial remission: Secondary | ICD-10-CM | POA: Diagnosis not present

## 2020-01-18 DIAGNOSIS — C44311 Basal cell carcinoma of skin of nose: Secondary | ICD-10-CM | POA: Diagnosis not present

## 2020-01-18 DIAGNOSIS — N2581 Secondary hyperparathyroidism of renal origin: Secondary | ICD-10-CM | POA: Diagnosis not present

## 2020-01-18 DIAGNOSIS — M1612 Unilateral primary osteoarthritis, left hip: Secondary | ICD-10-CM | POA: Diagnosis not present

## 2020-01-18 DIAGNOSIS — Z1159 Encounter for screening for other viral diseases: Secondary | ICD-10-CM | POA: Diagnosis not present

## 2020-01-18 DIAGNOSIS — I4510 Unspecified right bundle-branch block: Secondary | ICD-10-CM | POA: Diagnosis not present

## 2020-01-18 DIAGNOSIS — R030 Elevated blood-pressure reading, without diagnosis of hypertension: Secondary | ICD-10-CM | POA: Diagnosis not present

## 2020-01-18 DIAGNOSIS — Z794 Long term (current) use of insulin: Secondary | ICD-10-CM | POA: Diagnosis not present

## 2020-01-18 DIAGNOSIS — M79605 Pain in left leg: Secondary | ICD-10-CM | POA: Diagnosis not present

## 2020-01-18 DIAGNOSIS — M170 Bilateral primary osteoarthritis of knee: Secondary | ICD-10-CM | POA: Diagnosis not present

## 2020-01-18 DIAGNOSIS — E039 Hypothyroidism, unspecified: Secondary | ICD-10-CM | POA: Diagnosis not present

## 2020-01-18 DIAGNOSIS — N189 Chronic kidney disease, unspecified: Secondary | ICD-10-CM | POA: Diagnosis not present

## 2020-01-18 DIAGNOSIS — N390 Urinary tract infection, site not specified: Secondary | ICD-10-CM | POA: Diagnosis not present

## 2020-01-18 DIAGNOSIS — R109 Unspecified abdominal pain: Secondary | ICD-10-CM | POA: Diagnosis not present

## 2020-01-18 DIAGNOSIS — I129 Hypertensive chronic kidney disease with stage 1 through stage 4 chronic kidney disease, or unspecified chronic kidney disease: Secondary | ICD-10-CM | POA: Diagnosis not present

## 2020-01-18 DIAGNOSIS — E1165 Type 2 diabetes mellitus with hyperglycemia: Secondary | ICD-10-CM | POA: Diagnosis not present

## 2020-01-18 DIAGNOSIS — I4819 Other persistent atrial fibrillation: Secondary | ICD-10-CM | POA: Diagnosis not present

## 2020-01-18 DIAGNOSIS — C3411 Malignant neoplasm of upper lobe, right bronchus or lung: Secondary | ICD-10-CM | POA: Diagnosis not present

## 2020-01-18 DIAGNOSIS — R4182 Altered mental status, unspecified: Secondary | ICD-10-CM | POA: Diagnosis not present

## 2020-01-18 DIAGNOSIS — N179 Acute kidney failure, unspecified: Secondary | ICD-10-CM | POA: Diagnosis not present

## 2020-01-18 DIAGNOSIS — Z20828 Contact with and (suspected) exposure to other viral communicable diseases: Secondary | ICD-10-CM | POA: Diagnosis not present

## 2020-01-18 DIAGNOSIS — F339 Major depressive disorder, recurrent, unspecified: Secondary | ICD-10-CM | POA: Diagnosis not present

## 2020-01-18 DIAGNOSIS — C9110 Chronic lymphocytic leukemia of B-cell type not having achieved remission: Secondary | ICD-10-CM | POA: Diagnosis not present

## 2020-01-18 DIAGNOSIS — G610 Guillain-Barre syndrome: Secondary | ICD-10-CM | POA: Diagnosis not present

## 2020-01-18 DIAGNOSIS — Z79811 Long term (current) use of aromatase inhibitors: Secondary | ICD-10-CM | POA: Diagnosis not present

## 2020-01-18 DIAGNOSIS — D689 Coagulation defect, unspecified: Secondary | ICD-10-CM | POA: Diagnosis not present

## 2020-01-18 DIAGNOSIS — R7301 Impaired fasting glucose: Secondary | ICD-10-CM | POA: Diagnosis not present

## 2020-01-18 DIAGNOSIS — Z7902 Long term (current) use of antithrombotics/antiplatelets: Secondary | ICD-10-CM | POA: Diagnosis not present

## 2020-01-18 DIAGNOSIS — H35341 Macular cyst, hole, or pseudohole, right eye: Secondary | ICD-10-CM | POA: Diagnosis not present

## 2020-01-18 DIAGNOSIS — J8410 Pulmonary fibrosis, unspecified: Secondary | ICD-10-CM | POA: Diagnosis not present

## 2020-01-18 DIAGNOSIS — J449 Chronic obstructive pulmonary disease, unspecified: Secondary | ICD-10-CM | POA: Diagnosis not present

## 2020-01-18 DIAGNOSIS — R29898 Other symptoms and signs involving the musculoskeletal system: Secondary | ICD-10-CM | POA: Diagnosis not present

## 2020-01-18 DIAGNOSIS — L438 Other lichen planus: Secondary | ICD-10-CM | POA: Diagnosis not present

## 2020-01-18 DIAGNOSIS — M1711 Unilateral primary osteoarthritis, right knee: Secondary | ICD-10-CM | POA: Diagnosis not present

## 2020-01-18 DIAGNOSIS — Z7984 Long term (current) use of oral hypoglycemic drugs: Secondary | ICD-10-CM | POA: Diagnosis not present

## 2020-01-18 DIAGNOSIS — Z96652 Presence of left artificial knee joint: Secondary | ICD-10-CM | POA: Diagnosis not present

## 2020-01-18 DIAGNOSIS — M25512 Pain in left shoulder: Secondary | ICD-10-CM | POA: Diagnosis not present

## 2020-01-18 DIAGNOSIS — E559 Vitamin D deficiency, unspecified: Secondary | ICD-10-CM | POA: Diagnosis not present

## 2020-01-18 DIAGNOSIS — E113299 Type 2 diabetes mellitus with mild nonproliferative diabetic retinopathy without macular edema, unspecified eye: Secondary | ICD-10-CM | POA: Diagnosis not present

## 2020-01-18 DIAGNOSIS — M961 Postlaminectomy syndrome, not elsewhere classified: Secondary | ICD-10-CM | POA: Diagnosis not present

## 2020-01-18 DIAGNOSIS — M9905 Segmental and somatic dysfunction of pelvic region: Secondary | ICD-10-CM | POA: Diagnosis not present

## 2020-01-18 DIAGNOSIS — E1151 Type 2 diabetes mellitus with diabetic peripheral angiopathy without gangrene: Secondary | ICD-10-CM | POA: Diagnosis not present

## 2020-01-18 DIAGNOSIS — M5417 Radiculopathy, lumbosacral region: Secondary | ICD-10-CM | POA: Diagnosis not present

## 2020-01-18 DIAGNOSIS — Z01818 Encounter for other preprocedural examination: Secondary | ICD-10-CM | POA: Diagnosis not present

## 2020-01-18 DIAGNOSIS — Z96653 Presence of artificial knee joint, bilateral: Secondary | ICD-10-CM | POA: Diagnosis not present

## 2020-01-18 DIAGNOSIS — M47816 Spondylosis without myelopathy or radiculopathy, lumbar region: Secondary | ICD-10-CM | POA: Diagnosis not present

## 2020-01-18 DIAGNOSIS — F329 Major depressive disorder, single episode, unspecified: Secondary | ICD-10-CM | POA: Diagnosis not present

## 2020-01-18 DIAGNOSIS — Z79899 Other long term (current) drug therapy: Secondary | ICD-10-CM | POA: Diagnosis not present

## 2020-01-18 DIAGNOSIS — R1013 Epigastric pain: Secondary | ICD-10-CM | POA: Diagnosis not present

## 2020-01-18 DIAGNOSIS — J42 Unspecified chronic bronchitis: Secondary | ICD-10-CM | POA: Diagnosis not present

## 2020-01-18 DIAGNOSIS — I6523 Occlusion and stenosis of bilateral carotid arteries: Secondary | ICD-10-CM | POA: Diagnosis not present

## 2020-01-18 DIAGNOSIS — D649 Anemia, unspecified: Secondary | ICD-10-CM | POA: Diagnosis not present

## 2020-01-18 DIAGNOSIS — R7989 Other specified abnormal findings of blood chemistry: Secondary | ICD-10-CM | POA: Diagnosis not present

## 2020-01-18 DIAGNOSIS — M810 Age-related osteoporosis without current pathological fracture: Secondary | ICD-10-CM | POA: Diagnosis not present

## 2020-01-18 DIAGNOSIS — M542 Cervicalgia: Secondary | ICD-10-CM | POA: Diagnosis not present

## 2020-01-18 DIAGNOSIS — R002 Palpitations: Secondary | ICD-10-CM | POA: Diagnosis not present

## 2020-01-18 DIAGNOSIS — Z8349 Family history of other endocrine, nutritional and metabolic diseases: Secondary | ICD-10-CM | POA: Diagnosis not present

## 2020-01-18 DIAGNOSIS — H353131 Nonexudative age-related macular degeneration, bilateral, early dry stage: Secondary | ICD-10-CM | POA: Diagnosis not present

## 2020-01-18 DIAGNOSIS — J84112 Idiopathic pulmonary fibrosis: Secondary | ICD-10-CM | POA: Diagnosis not present

## 2020-01-18 DIAGNOSIS — N400 Enlarged prostate without lower urinary tract symptoms: Secondary | ICD-10-CM | POA: Diagnosis not present

## 2020-01-18 DIAGNOSIS — Z952 Presence of prosthetic heart valve: Secondary | ICD-10-CM | POA: Diagnosis not present

## 2020-01-18 DIAGNOSIS — L821 Other seborrheic keratosis: Secondary | ICD-10-CM | POA: Diagnosis not present

## 2020-01-18 DIAGNOSIS — R7309 Other abnormal glucose: Secondary | ICD-10-CM | POA: Diagnosis not present

## 2020-01-18 DIAGNOSIS — N402 Nodular prostate without lower urinary tract symptoms: Secondary | ICD-10-CM | POA: Diagnosis not present

## 2020-01-18 DIAGNOSIS — Z20822 Contact with and (suspected) exposure to covid-19: Secondary | ICD-10-CM | POA: Diagnosis not present

## 2020-01-18 DIAGNOSIS — Z5189 Encounter for other specified aftercare: Secondary | ICD-10-CM | POA: Diagnosis not present

## 2020-01-18 DIAGNOSIS — R29818 Other symptoms and signs involving the nervous system: Secondary | ICD-10-CM | POA: Diagnosis not present

## 2020-01-18 DIAGNOSIS — Z48816 Encounter for surgical aftercare following surgery on the genitourinary system: Secondary | ICD-10-CM | POA: Diagnosis not present

## 2020-01-18 DIAGNOSIS — Z125 Encounter for screening for malignant neoplasm of prostate: Secondary | ICD-10-CM | POA: Diagnosis not present

## 2020-01-18 DIAGNOSIS — N138 Other obstructive and reflux uropathy: Secondary | ICD-10-CM | POA: Diagnosis not present

## 2020-01-18 DIAGNOSIS — Z170 Estrogen receptor positive status [ER+]: Secondary | ICD-10-CM | POA: Diagnosis not present

## 2020-01-18 DIAGNOSIS — K5720 Diverticulitis of large intestine with perforation and abscess without bleeding: Secondary | ICD-10-CM | POA: Diagnosis not present

## 2020-01-18 DIAGNOSIS — D2272 Melanocytic nevi of left lower limb, including hip: Secondary | ICD-10-CM | POA: Diagnosis not present

## 2020-01-18 DIAGNOSIS — R972 Elevated prostate specific antigen [PSA]: Secondary | ICD-10-CM | POA: Diagnosis not present

## 2020-01-18 DIAGNOSIS — L820 Inflamed seborrheic keratosis: Secondary | ICD-10-CM | POA: Diagnosis not present

## 2020-01-18 DIAGNOSIS — Z114 Encounter for screening for human immunodeficiency virus [HIV]: Secondary | ICD-10-CM | POA: Diagnosis not present

## 2020-01-18 DIAGNOSIS — M1712 Unilateral primary osteoarthritis, left knee: Secondary | ICD-10-CM | POA: Diagnosis not present

## 2020-01-18 DIAGNOSIS — K5090 Crohn's disease, unspecified, without complications: Secondary | ICD-10-CM | POA: Diagnosis not present

## 2020-01-18 DIAGNOSIS — L408 Other psoriasis: Secondary | ICD-10-CM | POA: Diagnosis not present

## 2020-01-18 DIAGNOSIS — R531 Weakness: Secondary | ICD-10-CM | POA: Diagnosis not present

## 2020-01-18 DIAGNOSIS — M25552 Pain in left hip: Secondary | ICD-10-CM | POA: Diagnosis not present

## 2020-01-18 DIAGNOSIS — M48061 Spinal stenosis, lumbar region without neurogenic claudication: Secondary | ICD-10-CM | POA: Diagnosis not present

## 2020-01-18 DIAGNOSIS — I69318 Other symptoms and signs involving cognitive functions following cerebral infarction: Secondary | ICD-10-CM | POA: Diagnosis not present

## 2020-01-18 DIAGNOSIS — L578 Other skin changes due to chronic exposure to nonionizing radiation: Secondary | ICD-10-CM | POA: Diagnosis not present

## 2020-01-18 DIAGNOSIS — N319 Neuromuscular dysfunction of bladder, unspecified: Secondary | ICD-10-CM | POA: Diagnosis not present

## 2020-01-18 DIAGNOSIS — H9313 Tinnitus, bilateral: Secondary | ICD-10-CM | POA: Diagnosis not present

## 2020-01-18 DIAGNOSIS — E0821 Diabetes mellitus due to underlying condition with diabetic nephropathy: Secondary | ICD-10-CM | POA: Diagnosis not present

## 2020-01-18 DIAGNOSIS — J1282 Pneumonia due to coronavirus disease 2019: Secondary | ICD-10-CM | POA: Diagnosis not present

## 2020-01-18 DIAGNOSIS — M955 Acquired deformity of pelvis: Secondary | ICD-10-CM | POA: Diagnosis not present

## 2020-01-18 DIAGNOSIS — F324 Major depressive disorder, single episode, in partial remission: Secondary | ICD-10-CM | POA: Diagnosis not present

## 2020-01-18 DIAGNOSIS — S61401A Unspecified open wound of right hand, initial encounter: Secondary | ICD-10-CM | POA: Diagnosis not present

## 2020-01-18 DIAGNOSIS — Z4789 Encounter for other orthopedic aftercare: Secondary | ICD-10-CM | POA: Diagnosis not present

## 2020-01-18 DIAGNOSIS — M436 Torticollis: Secondary | ICD-10-CM | POA: Diagnosis not present

## 2020-01-18 DIAGNOSIS — M25511 Pain in right shoulder: Secondary | ICD-10-CM | POA: Diagnosis not present

## 2020-01-18 DIAGNOSIS — M47896 Other spondylosis, lumbar region: Secondary | ICD-10-CM | POA: Diagnosis not present

## 2020-01-18 DIAGNOSIS — C50311 Malignant neoplasm of lower-inner quadrant of right female breast: Secondary | ICD-10-CM | POA: Diagnosis not present

## 2020-01-18 DIAGNOSIS — I480 Paroxysmal atrial fibrillation: Secondary | ICD-10-CM | POA: Diagnosis not present

## 2020-01-18 DIAGNOSIS — E291 Testicular hypofunction: Secondary | ICD-10-CM | POA: Diagnosis not present

## 2020-01-18 DIAGNOSIS — R293 Abnormal posture: Secondary | ICD-10-CM | POA: Diagnosis not present

## 2020-01-18 DIAGNOSIS — J9601 Acute respiratory failure with hypoxia: Secondary | ICD-10-CM | POA: Diagnosis not present

## 2020-01-18 DIAGNOSIS — M5432 Sciatica, left side: Secondary | ICD-10-CM | POA: Diagnosis not present

## 2020-01-18 DIAGNOSIS — E890 Postprocedural hypothyroidism: Secondary | ICD-10-CM | POA: Diagnosis not present

## 2020-01-18 DIAGNOSIS — R1312 Dysphagia, oropharyngeal phase: Secondary | ICD-10-CM | POA: Diagnosis not present

## 2020-01-18 DIAGNOSIS — L570 Actinic keratosis: Secondary | ICD-10-CM | POA: Diagnosis not present

## 2020-01-18 DIAGNOSIS — S41101A Unspecified open wound of right upper arm, initial encounter: Secondary | ICD-10-CM | POA: Diagnosis not present

## 2020-01-18 DIAGNOSIS — G43909 Migraine, unspecified, not intractable, without status migrainosus: Secondary | ICD-10-CM | POA: Diagnosis not present

## 2020-01-18 DIAGNOSIS — R0902 Hypoxemia: Secondary | ICD-10-CM | POA: Diagnosis not present

## 2020-01-18 DIAGNOSIS — Z833 Family history of diabetes mellitus: Secondary | ICD-10-CM | POA: Diagnosis not present

## 2020-01-18 DIAGNOSIS — Z1283 Encounter for screening for malignant neoplasm of skin: Secondary | ICD-10-CM | POA: Diagnosis not present

## 2020-01-18 DIAGNOSIS — Z87891 Personal history of nicotine dependence: Secondary | ICD-10-CM | POA: Diagnosis not present

## 2020-01-18 DIAGNOSIS — R3129 Other microscopic hematuria: Secondary | ICD-10-CM | POA: Diagnosis not present

## 2020-01-18 DIAGNOSIS — R944 Abnormal results of kidney function studies: Secondary | ICD-10-CM | POA: Diagnosis not present

## 2020-01-18 DIAGNOSIS — Z0000 Encounter for general adult medical examination without abnormal findings: Secondary | ICD-10-CM | POA: Diagnosis not present

## 2020-01-18 DIAGNOSIS — E7800 Pure hypercholesterolemia, unspecified: Secondary | ICD-10-CM | POA: Diagnosis not present

## 2020-01-18 DIAGNOSIS — M9903 Segmental and somatic dysfunction of lumbar region: Secondary | ICD-10-CM | POA: Diagnosis not present

## 2020-01-18 DIAGNOSIS — K635 Polyp of colon: Secondary | ICD-10-CM | POA: Diagnosis not present

## 2020-01-18 DIAGNOSIS — N3000 Acute cystitis without hematuria: Secondary | ICD-10-CM | POA: Diagnosis not present

## 2020-01-18 DIAGNOSIS — J029 Acute pharyngitis, unspecified: Secondary | ICD-10-CM | POA: Diagnosis not present

## 2020-01-18 DIAGNOSIS — E782 Mixed hyperlipidemia: Secondary | ICD-10-CM | POA: Diagnosis not present

## 2020-01-18 DIAGNOSIS — E1159 Type 2 diabetes mellitus with other circulatory complications: Secondary | ICD-10-CM | POA: Diagnosis not present

## 2020-01-18 DIAGNOSIS — I493 Ventricular premature depolarization: Secondary | ICD-10-CM | POA: Diagnosis not present

## 2020-01-18 DIAGNOSIS — Z7409 Other reduced mobility: Secondary | ICD-10-CM | POA: Diagnosis not present

## 2020-01-18 DIAGNOSIS — M47817 Spondylosis without myelopathy or radiculopathy, lumbosacral region: Secondary | ICD-10-CM | POA: Diagnosis not present

## 2020-01-18 DIAGNOSIS — I5042 Chronic combined systolic (congestive) and diastolic (congestive) heart failure: Secondary | ICD-10-CM | POA: Diagnosis not present

## 2020-01-18 DIAGNOSIS — G4733 Obstructive sleep apnea (adult) (pediatric): Secondary | ICD-10-CM | POA: Diagnosis not present

## 2020-01-18 DIAGNOSIS — F05 Delirium due to known physiological condition: Secondary | ICD-10-CM | POA: Diagnosis not present

## 2020-01-18 DIAGNOSIS — G8929 Other chronic pain: Secondary | ICD-10-CM | POA: Diagnosis not present

## 2020-01-18 DIAGNOSIS — F0390 Unspecified dementia without behavioral disturbance: Secondary | ICD-10-CM | POA: Diagnosis not present

## 2020-01-18 DIAGNOSIS — D500 Iron deficiency anemia secondary to blood loss (chronic): Secondary | ICD-10-CM | POA: Diagnosis not present

## 2020-01-18 DIAGNOSIS — F4310 Post-traumatic stress disorder, unspecified: Secondary | ICD-10-CM | POA: Diagnosis not present

## 2020-01-18 DIAGNOSIS — I10 Essential (primary) hypertension: Secondary | ICD-10-CM | POA: Diagnosis not present

## 2020-01-18 DIAGNOSIS — M6281 Muscle weakness (generalized): Secondary | ICD-10-CM | POA: Diagnosis not present

## 2020-01-18 DIAGNOSIS — Z801 Family history of malignant neoplasm of trachea, bronchus and lung: Secondary | ICD-10-CM | POA: Diagnosis not present

## 2020-01-18 DIAGNOSIS — M5388 Other specified dorsopathies, sacral and sacrococcygeal region: Secondary | ICD-10-CM | POA: Diagnosis not present

## 2020-01-18 DIAGNOSIS — K589 Irritable bowel syndrome without diarrhea: Secondary | ICD-10-CM | POA: Diagnosis not present

## 2020-01-18 DIAGNOSIS — F0391 Unspecified dementia with behavioral disturbance: Secondary | ICD-10-CM | POA: Diagnosis not present

## 2020-01-18 DIAGNOSIS — M9904 Segmental and somatic dysfunction of sacral region: Secondary | ICD-10-CM | POA: Diagnosis not present

## 2020-01-18 DIAGNOSIS — R2681 Unsteadiness on feet: Secondary | ICD-10-CM | POA: Diagnosis not present

## 2020-01-18 DIAGNOSIS — K219 Gastro-esophageal reflux disease without esophagitis: Secondary | ICD-10-CM | POA: Diagnosis not present

## 2020-01-18 DIAGNOSIS — D225 Melanocytic nevi of trunk: Secondary | ICD-10-CM | POA: Diagnosis not present

## 2020-01-18 DIAGNOSIS — S72001A Fracture of unspecified part of neck of right femur, initial encounter for closed fracture: Secondary | ICD-10-CM | POA: Diagnosis not present

## 2020-01-18 DIAGNOSIS — T839XXD Unspecified complication of genitourinary prosthetic device, implant and graft, subsequent encounter: Secondary | ICD-10-CM | POA: Diagnosis not present

## 2020-01-18 DIAGNOSIS — C4361 Malignant melanoma of right upper limb, including shoulder: Secondary | ICD-10-CM | POA: Diagnosis not present

## 2020-01-18 DIAGNOSIS — M25861 Other specified joint disorders, right knee: Secondary | ICD-10-CM | POA: Diagnosis not present

## 2020-01-18 DIAGNOSIS — D513 Other dietary vitamin B12 deficiency anemia: Secondary | ICD-10-CM | POA: Diagnosis not present

## 2020-01-18 DIAGNOSIS — M25862 Other specified joint disorders, left knee: Secondary | ICD-10-CM | POA: Diagnosis not present

## 2020-01-18 DIAGNOSIS — M47812 Spondylosis without myelopathy or radiculopathy, cervical region: Secondary | ICD-10-CM | POA: Diagnosis not present

## 2020-01-18 DIAGNOSIS — M25532 Pain in left wrist: Secondary | ICD-10-CM | POA: Diagnosis not present

## 2020-01-18 DIAGNOSIS — H409 Unspecified glaucoma: Secondary | ICD-10-CM | POA: Diagnosis not present

## 2020-01-18 DIAGNOSIS — C61 Malignant neoplasm of prostate: Secondary | ICD-10-CM | POA: Diagnosis not present

## 2020-01-18 DIAGNOSIS — D72829 Elevated white blood cell count, unspecified: Secondary | ICD-10-CM | POA: Diagnosis not present

## 2020-01-18 DIAGNOSIS — Z955 Presence of coronary angioplasty implant and graft: Secondary | ICD-10-CM | POA: Diagnosis not present

## 2020-01-18 DIAGNOSIS — E860 Dehydration: Secondary | ICD-10-CM | POA: Diagnosis not present

## 2020-01-18 DIAGNOSIS — R0602 Shortness of breath: Secondary | ICD-10-CM | POA: Diagnosis not present

## 2020-01-18 DIAGNOSIS — F411 Generalized anxiety disorder: Secondary | ICD-10-CM | POA: Diagnosis not present

## 2020-01-18 DIAGNOSIS — Z87442 Personal history of urinary calculi: Secondary | ICD-10-CM | POA: Diagnosis not present

## 2020-01-18 DIAGNOSIS — M25661 Stiffness of right knee, not elsewhere classified: Secondary | ICD-10-CM | POA: Diagnosis not present

## 2020-01-18 DIAGNOSIS — R414 Neurologic neglect syndrome: Secondary | ICD-10-CM | POA: Diagnosis not present

## 2020-01-18 DIAGNOSIS — G894 Chronic pain syndrome: Secondary | ICD-10-CM | POA: Diagnosis not present

## 2020-01-18 DIAGNOSIS — M25612 Stiffness of left shoulder, not elsewhere classified: Secondary | ICD-10-CM | POA: Diagnosis not present

## 2020-01-18 DIAGNOSIS — N184 Chronic kidney disease, stage 4 (severe): Secondary | ICD-10-CM | POA: Diagnosis not present

## 2020-01-18 DIAGNOSIS — J479 Bronchiectasis, uncomplicated: Secondary | ICD-10-CM | POA: Diagnosis not present

## 2020-01-18 DIAGNOSIS — Z880 Allergy status to penicillin: Secondary | ICD-10-CM | POA: Diagnosis not present

## 2020-01-18 DIAGNOSIS — K4090 Unilateral inguinal hernia, without obstruction or gangrene, not specified as recurrent: Secondary | ICD-10-CM | POA: Diagnosis not present

## 2020-01-18 DIAGNOSIS — Z8673 Personal history of transient ischemic attack (TIA), and cerebral infarction without residual deficits: Secondary | ICD-10-CM | POA: Diagnosis not present

## 2020-01-18 DIAGNOSIS — C44729 Squamous cell carcinoma of skin of left lower limb, including hip: Secondary | ICD-10-CM | POA: Diagnosis not present

## 2020-01-18 DIAGNOSIS — C801 Malignant (primary) neoplasm, unspecified: Secondary | ICD-10-CM | POA: Diagnosis not present

## 2020-01-18 DIAGNOSIS — F330 Major depressive disorder, recurrent, mild: Secondary | ICD-10-CM | POA: Diagnosis not present

## 2020-01-18 DIAGNOSIS — R351 Nocturia: Secondary | ICD-10-CM | POA: Diagnosis not present

## 2020-01-18 DIAGNOSIS — E538 Deficiency of other specified B group vitamins: Secondary | ICD-10-CM | POA: Diagnosis not present

## 2020-01-18 DIAGNOSIS — M7631 Iliotibial band syndrome, right leg: Secondary | ICD-10-CM | POA: Diagnosis not present

## 2020-01-18 DIAGNOSIS — M5137 Other intervertebral disc degeneration, lumbosacral region: Secondary | ICD-10-CM | POA: Diagnosis not present

## 2020-01-18 DIAGNOSIS — C9002 Multiple myeloma in relapse: Secondary | ICD-10-CM | POA: Diagnosis not present

## 2020-01-18 DIAGNOSIS — C50812 Malignant neoplasm of overlapping sites of left female breast: Secondary | ICD-10-CM | POA: Diagnosis not present

## 2020-01-18 DIAGNOSIS — D509 Iron deficiency anemia, unspecified: Secondary | ICD-10-CM | POA: Diagnosis not present

## 2020-01-18 DIAGNOSIS — Z7982 Long term (current) use of aspirin: Secondary | ICD-10-CM | POA: Diagnosis not present

## 2020-01-18 DIAGNOSIS — Z1329 Encounter for screening for other suspected endocrine disorder: Secondary | ICD-10-CM | POA: Diagnosis not present

## 2020-01-18 DIAGNOSIS — M9901 Segmental and somatic dysfunction of cervical region: Secondary | ICD-10-CM | POA: Diagnosis not present

## 2020-01-18 DIAGNOSIS — M5412 Radiculopathy, cervical region: Secondary | ICD-10-CM | POA: Diagnosis not present

## 2020-01-18 DIAGNOSIS — M5431 Sciatica, right side: Secondary | ICD-10-CM | POA: Diagnosis not present

## 2020-01-18 DIAGNOSIS — I214 Non-ST elevation (NSTEMI) myocardial infarction: Secondary | ICD-10-CM | POA: Diagnosis not present

## 2020-01-18 DIAGNOSIS — N186 End stage renal disease: Secondary | ICD-10-CM | POA: Diagnosis not present

## 2020-01-18 DIAGNOSIS — S32592A Other specified fracture of left pubis, initial encounter for closed fracture: Secondary | ICD-10-CM | POA: Diagnosis not present

## 2020-01-18 DIAGNOSIS — G20 Parkinson's disease: Secondary | ICD-10-CM | POA: Diagnosis not present

## 2020-01-18 DIAGNOSIS — I4891 Unspecified atrial fibrillation: Secondary | ICD-10-CM | POA: Diagnosis not present

## 2020-01-18 DIAGNOSIS — K529 Noninfective gastroenteritis and colitis, unspecified: Secondary | ICD-10-CM | POA: Diagnosis not present

## 2020-01-18 DIAGNOSIS — N1831 Chronic kidney disease, stage 3a: Secondary | ICD-10-CM | POA: Diagnosis not present

## 2020-01-18 DIAGNOSIS — Z1211 Encounter for screening for malignant neoplasm of colon: Secondary | ICD-10-CM | POA: Diagnosis not present

## 2020-01-18 DIAGNOSIS — D519 Vitamin B12 deficiency anemia, unspecified: Secondary | ICD-10-CM | POA: Diagnosis not present

## 2020-01-18 DIAGNOSIS — L814 Other melanin hyperpigmentation: Secondary | ICD-10-CM | POA: Diagnosis not present

## 2020-01-18 DIAGNOSIS — Z8249 Family history of ischemic heart disease and other diseases of the circulatory system: Secondary | ICD-10-CM | POA: Diagnosis not present

## 2020-01-18 DIAGNOSIS — E119 Type 2 diabetes mellitus without complications: Secondary | ICD-10-CM | POA: Diagnosis not present

## 2020-01-18 DIAGNOSIS — I9589 Other hypotension: Secondary | ICD-10-CM | POA: Diagnosis not present

## 2020-01-18 DIAGNOSIS — H2512 Age-related nuclear cataract, left eye: Secondary | ICD-10-CM | POA: Diagnosis not present

## 2020-01-18 DIAGNOSIS — M21752 Unequal limb length (acquired), left femur: Secondary | ICD-10-CM | POA: Diagnosis not present

## 2020-01-18 DIAGNOSIS — G9009 Other idiopathic peripheral autonomic neuropathy: Secondary | ICD-10-CM | POA: Diagnosis not present

## 2020-01-18 DIAGNOSIS — E279 Disorder of adrenal gland, unspecified: Secondary | ICD-10-CM | POA: Diagnosis not present

## 2020-01-18 DIAGNOSIS — Z85038 Personal history of other malignant neoplasm of large intestine: Secondary | ICD-10-CM | POA: Diagnosis not present

## 2020-01-18 DIAGNOSIS — J45909 Unspecified asthma, uncomplicated: Secondary | ICD-10-CM | POA: Diagnosis not present

## 2020-01-19 DIAGNOSIS — M25551 Pain in right hip: Secondary | ICD-10-CM | POA: Diagnosis not present

## 2020-01-19 DIAGNOSIS — G8929 Other chronic pain: Secondary | ICD-10-CM | POA: Diagnosis not present

## 2020-01-19 DIAGNOSIS — I341 Nonrheumatic mitral (valve) prolapse: Secondary | ICD-10-CM | POA: Diagnosis not present

## 2020-01-19 DIAGNOSIS — M9903 Segmental and somatic dysfunction of lumbar region: Secondary | ICD-10-CM | POA: Diagnosis not present

## 2020-01-19 DIAGNOSIS — C8338 Diffuse large B-cell lymphoma, lymph nodes of multiple sites: Secondary | ICD-10-CM | POA: Diagnosis not present

## 2020-01-19 DIAGNOSIS — H353121 Nonexudative age-related macular degeneration, left eye, early dry stage: Secondary | ICD-10-CM | POA: Diagnosis not present

## 2020-01-19 DIAGNOSIS — D701 Agranulocytosis secondary to cancer chemotherapy: Secondary | ICD-10-CM | POA: Diagnosis not present

## 2020-01-19 DIAGNOSIS — M898X9 Other specified disorders of bone, unspecified site: Secondary | ICD-10-CM | POA: Diagnosis not present

## 2020-01-19 DIAGNOSIS — Z510 Encounter for antineoplastic radiation therapy: Secondary | ICD-10-CM | POA: Diagnosis not present

## 2020-01-19 DIAGNOSIS — Z170 Estrogen receptor positive status [ER+]: Secondary | ICD-10-CM | POA: Diagnosis not present

## 2020-01-19 DIAGNOSIS — R8290 Unspecified abnormal findings in urine: Secondary | ICD-10-CM | POA: Diagnosis not present

## 2020-01-19 DIAGNOSIS — L97822 Non-pressure chronic ulcer of other part of left lower leg with fat layer exposed: Secondary | ICD-10-CM | POA: Diagnosis not present

## 2020-01-19 DIAGNOSIS — F0391 Unspecified dementia with behavioral disturbance: Secondary | ICD-10-CM | POA: Diagnosis not present

## 2020-01-19 DIAGNOSIS — R278 Other lack of coordination: Secondary | ICD-10-CM | POA: Diagnosis not present

## 2020-01-19 DIAGNOSIS — I480 Paroxysmal atrial fibrillation: Secondary | ICD-10-CM | POA: Diagnosis not present

## 2020-01-19 DIAGNOSIS — G43909 Migraine, unspecified, not intractable, without status migrainosus: Secondary | ICD-10-CM | POA: Diagnosis not present

## 2020-01-19 DIAGNOSIS — M7702 Medial epicondylitis, left elbow: Secondary | ICD-10-CM | POA: Diagnosis not present

## 2020-01-19 DIAGNOSIS — Z8051 Family history of malignant neoplasm of kidney: Secondary | ICD-10-CM | POA: Diagnosis not present

## 2020-01-19 DIAGNOSIS — Z79899 Other long term (current) drug therapy: Secondary | ICD-10-CM | POA: Diagnosis not present

## 2020-01-19 DIAGNOSIS — T8452XA Infection and inflammatory reaction due to internal left hip prosthesis, initial encounter: Secondary | ICD-10-CM | POA: Diagnosis not present

## 2020-01-19 DIAGNOSIS — R5381 Other malaise: Secondary | ICD-10-CM | POA: Diagnosis not present

## 2020-01-19 DIAGNOSIS — R978 Other abnormal tumor markers: Secondary | ICD-10-CM | POA: Diagnosis not present

## 2020-01-19 DIAGNOSIS — D696 Thrombocytopenia, unspecified: Secondary | ICD-10-CM | POA: Diagnosis not present

## 2020-01-19 DIAGNOSIS — E1169 Type 2 diabetes mellitus with other specified complication: Secondary | ICD-10-CM | POA: Diagnosis not present

## 2020-01-19 DIAGNOSIS — I4891 Unspecified atrial fibrillation: Secondary | ICD-10-CM | POA: Diagnosis not present

## 2020-01-19 DIAGNOSIS — D472 Monoclonal gammopathy: Secondary | ICD-10-CM | POA: Diagnosis not present

## 2020-01-19 DIAGNOSIS — H903 Sensorineural hearing loss, bilateral: Secondary | ICD-10-CM | POA: Diagnosis not present

## 2020-01-19 DIAGNOSIS — R0602 Shortness of breath: Secondary | ICD-10-CM | POA: Diagnosis not present

## 2020-01-19 DIAGNOSIS — J449 Chronic obstructive pulmonary disease, unspecified: Secondary | ICD-10-CM | POA: Diagnosis not present

## 2020-01-19 DIAGNOSIS — K7031 Alcoholic cirrhosis of liver with ascites: Secondary | ICD-10-CM | POA: Diagnosis not present

## 2020-01-19 DIAGNOSIS — I82452 Acute embolism and thrombosis of left peroneal vein: Secondary | ICD-10-CM | POA: Diagnosis not present

## 2020-01-19 DIAGNOSIS — C3490 Malignant neoplasm of unspecified part of unspecified bronchus or lung: Secondary | ICD-10-CM | POA: Diagnosis not present

## 2020-01-19 DIAGNOSIS — I129 Hypertensive chronic kidney disease with stage 1 through stage 4 chronic kidney disease, or unspecified chronic kidney disease: Secondary | ICD-10-CM | POA: Diagnosis not present

## 2020-01-19 DIAGNOSIS — C7951 Secondary malignant neoplasm of bone: Secondary | ICD-10-CM | POA: Diagnosis not present

## 2020-01-19 DIAGNOSIS — K760 Fatty (change of) liver, not elsewhere classified: Secondary | ICD-10-CM | POA: Diagnosis not present

## 2020-01-19 DIAGNOSIS — I651 Occlusion and stenosis of basilar artery: Secondary | ICD-10-CM | POA: Diagnosis not present

## 2020-01-19 DIAGNOSIS — Z6830 Body mass index (BMI) 30.0-30.9, adult: Secondary | ICD-10-CM | POA: Diagnosis not present

## 2020-01-19 DIAGNOSIS — R197 Diarrhea, unspecified: Secondary | ICD-10-CM | POA: Diagnosis not present

## 2020-01-19 DIAGNOSIS — Z6837 Body mass index (BMI) 37.0-37.9, adult: Secondary | ICD-10-CM | POA: Diagnosis not present

## 2020-01-19 DIAGNOSIS — M2241 Chondromalacia patellae, right knee: Secondary | ICD-10-CM | POA: Diagnosis not present

## 2020-01-19 DIAGNOSIS — L97812 Non-pressure chronic ulcer of other part of right lower leg with fat layer exposed: Secondary | ICD-10-CM | POA: Diagnosis not present

## 2020-01-19 DIAGNOSIS — E7800 Pure hypercholesterolemia, unspecified: Secondary | ICD-10-CM | POA: Diagnosis not present

## 2020-01-19 DIAGNOSIS — Z808 Family history of malignant neoplasm of other organs or systems: Secondary | ICD-10-CM | POA: Diagnosis not present

## 2020-01-19 DIAGNOSIS — N952 Postmenopausal atrophic vaginitis: Secondary | ICD-10-CM | POA: Diagnosis not present

## 2020-01-19 DIAGNOSIS — I25810 Atherosclerosis of coronary artery bypass graft(s) without angina pectoris: Secondary | ICD-10-CM | POA: Diagnosis not present

## 2020-01-19 DIAGNOSIS — Z7901 Long term (current) use of anticoagulants: Secondary | ICD-10-CM | POA: Diagnosis not present

## 2020-01-19 DIAGNOSIS — J9601 Acute respiratory failure with hypoxia: Secondary | ICD-10-CM | POA: Diagnosis not present

## 2020-01-19 DIAGNOSIS — C50511 Malignant neoplasm of lower-outer quadrant of right female breast: Secondary | ICD-10-CM | POA: Diagnosis not present

## 2020-01-19 DIAGNOSIS — Z8052 Family history of malignant neoplasm of bladder: Secondary | ICD-10-CM | POA: Diagnosis not present

## 2020-01-19 DIAGNOSIS — E860 Dehydration: Secondary | ICD-10-CM | POA: Diagnosis not present

## 2020-01-19 DIAGNOSIS — Z7984 Long term (current) use of oral hypoglycemic drugs: Secondary | ICD-10-CM | POA: Diagnosis not present

## 2020-01-19 DIAGNOSIS — E669 Obesity, unspecified: Secondary | ICD-10-CM | POA: Diagnosis not present

## 2020-01-19 DIAGNOSIS — Z806 Family history of leukemia: Secondary | ICD-10-CM | POA: Diagnosis not present

## 2020-01-19 DIAGNOSIS — D0439 Carcinoma in situ of skin of other parts of face: Secondary | ICD-10-CM | POA: Diagnosis not present

## 2020-01-19 DIAGNOSIS — C159 Malignant neoplasm of esophagus, unspecified: Secondary | ICD-10-CM | POA: Diagnosis not present

## 2020-01-19 DIAGNOSIS — Z902 Acquired absence of lung [part of]: Secondary | ICD-10-CM | POA: Diagnosis not present

## 2020-01-19 DIAGNOSIS — E039 Hypothyroidism, unspecified: Secondary | ICD-10-CM | POA: Diagnosis not present

## 2020-01-19 DIAGNOSIS — K5720 Diverticulitis of large intestine with perforation and abscess without bleeding: Secondary | ICD-10-CM | POA: Diagnosis not present

## 2020-01-19 DIAGNOSIS — Z801 Family history of malignant neoplasm of trachea, bronchus and lung: Secondary | ICD-10-CM | POA: Diagnosis not present

## 2020-01-19 DIAGNOSIS — E871 Hypo-osmolality and hyponatremia: Secondary | ICD-10-CM | POA: Diagnosis not present

## 2020-01-19 DIAGNOSIS — I890 Lymphedema, not elsewhere classified: Secondary | ICD-10-CM | POA: Diagnosis not present

## 2020-01-19 DIAGNOSIS — Z8543 Personal history of malignant neoplasm of ovary: Secondary | ICD-10-CM | POA: Diagnosis not present

## 2020-01-19 DIAGNOSIS — I25119 Atherosclerotic heart disease of native coronary artery with unspecified angina pectoris: Secondary | ICD-10-CM | POA: Diagnosis not present

## 2020-01-19 DIAGNOSIS — I679 Cerebrovascular disease, unspecified: Secondary | ICD-10-CM | POA: Diagnosis not present

## 2020-01-19 DIAGNOSIS — M1009 Idiopathic gout, multiple sites: Secondary | ICD-10-CM | POA: Diagnosis not present

## 2020-01-19 DIAGNOSIS — Z20828 Contact with and (suspected) exposure to other viral communicable diseases: Secondary | ICD-10-CM | POA: Diagnosis not present

## 2020-01-19 DIAGNOSIS — I509 Heart failure, unspecified: Secondary | ICD-10-CM | POA: Diagnosis not present

## 2020-01-19 DIAGNOSIS — K449 Diaphragmatic hernia without obstruction or gangrene: Secondary | ICD-10-CM | POA: Diagnosis not present

## 2020-01-19 DIAGNOSIS — I82441 Acute embolism and thrombosis of right tibial vein: Secondary | ICD-10-CM | POA: Diagnosis not present

## 2020-01-19 DIAGNOSIS — C61 Malignant neoplasm of prostate: Secondary | ICD-10-CM | POA: Diagnosis not present

## 2020-01-19 DIAGNOSIS — E1151 Type 2 diabetes mellitus with diabetic peripheral angiopathy without gangrene: Secondary | ICD-10-CM | POA: Diagnosis not present

## 2020-01-19 DIAGNOSIS — I350 Nonrheumatic aortic (valve) stenosis: Secondary | ICD-10-CM | POA: Diagnosis not present

## 2020-01-19 DIAGNOSIS — M6281 Muscle weakness (generalized): Secondary | ICD-10-CM | POA: Diagnosis not present

## 2020-01-19 DIAGNOSIS — N200 Calculus of kidney: Secondary | ICD-10-CM | POA: Diagnosis not present

## 2020-01-19 DIAGNOSIS — K7460 Unspecified cirrhosis of liver: Secondary | ICD-10-CM | POA: Diagnosis not present

## 2020-01-19 DIAGNOSIS — Z23 Encounter for immunization: Secondary | ICD-10-CM | POA: Diagnosis not present

## 2020-01-19 DIAGNOSIS — L97511 Non-pressure chronic ulcer of other part of right foot limited to breakdown of skin: Secondary | ICD-10-CM | POA: Diagnosis not present

## 2020-01-19 DIAGNOSIS — C786 Secondary malignant neoplasm of retroperitoneum and peritoneum: Secondary | ICD-10-CM | POA: Diagnosis not present

## 2020-01-19 DIAGNOSIS — Z90710 Acquired absence of both cervix and uterus: Secondary | ICD-10-CM | POA: Diagnosis not present

## 2020-01-19 DIAGNOSIS — G9341 Metabolic encephalopathy: Secondary | ICD-10-CM | POA: Diagnosis not present

## 2020-01-19 DIAGNOSIS — R3914 Feeling of incomplete bladder emptying: Secondary | ICD-10-CM | POA: Diagnosis not present

## 2020-01-19 DIAGNOSIS — Z7689 Persons encountering health services in other specified circumstances: Secondary | ICD-10-CM | POA: Diagnosis not present

## 2020-01-19 DIAGNOSIS — G4733 Obstructive sleep apnea (adult) (pediatric): Secondary | ICD-10-CM | POA: Diagnosis not present

## 2020-01-19 DIAGNOSIS — Z86718 Personal history of other venous thrombosis and embolism: Secondary | ICD-10-CM | POA: Diagnosis not present

## 2020-01-19 DIAGNOSIS — I6350 Cerebral infarction due to unspecified occlusion or stenosis of unspecified cerebral artery: Secondary | ICD-10-CM | POA: Diagnosis not present

## 2020-01-19 DIAGNOSIS — G5621 Lesion of ulnar nerve, right upper limb: Secondary | ICD-10-CM | POA: Diagnosis not present

## 2020-01-19 DIAGNOSIS — R188 Other ascites: Secondary | ICD-10-CM | POA: Diagnosis not present

## 2020-01-19 DIAGNOSIS — R918 Other nonspecific abnormal finding of lung field: Secondary | ICD-10-CM | POA: Diagnosis not present

## 2020-01-19 DIAGNOSIS — R600 Localized edema: Secondary | ICD-10-CM | POA: Diagnosis not present

## 2020-01-19 DIAGNOSIS — G4730 Sleep apnea, unspecified: Secondary | ICD-10-CM | POA: Diagnosis not present

## 2020-01-19 DIAGNOSIS — R5383 Other fatigue: Secondary | ICD-10-CM | POA: Diagnosis not present

## 2020-01-19 DIAGNOSIS — T8489XA Other specified complication of internal orthopedic prosthetic devices, implants and grafts, initial encounter: Secondary | ICD-10-CM | POA: Diagnosis not present

## 2020-01-19 DIAGNOSIS — G4700 Insomnia, unspecified: Secondary | ICD-10-CM | POA: Diagnosis not present

## 2020-01-19 DIAGNOSIS — M79606 Pain in leg, unspecified: Secondary | ICD-10-CM | POA: Diagnosis not present

## 2020-01-19 DIAGNOSIS — I110 Hypertensive heart disease with heart failure: Secondary | ICD-10-CM | POA: Diagnosis not present

## 2020-01-19 DIAGNOSIS — D649 Anemia, unspecified: Secondary | ICD-10-CM | POA: Diagnosis not present

## 2020-01-19 DIAGNOSIS — D0462 Carcinoma in situ of skin of left upper limb, including shoulder: Secondary | ICD-10-CM | POA: Diagnosis not present

## 2020-01-19 DIAGNOSIS — I25118 Atherosclerotic heart disease of native coronary artery with other forms of angina pectoris: Secondary | ICD-10-CM | POA: Diagnosis not present

## 2020-01-19 DIAGNOSIS — Z5112 Encounter for antineoplastic immunotherapy: Secondary | ICD-10-CM | POA: Diagnosis not present

## 2020-01-19 DIAGNOSIS — E119 Type 2 diabetes mellitus without complications: Secondary | ICD-10-CM | POA: Diagnosis not present

## 2020-01-19 DIAGNOSIS — R300 Dysuria: Secondary | ICD-10-CM | POA: Diagnosis not present

## 2020-01-19 DIAGNOSIS — Z85118 Personal history of other malignant neoplasm of bronchus and lung: Secondary | ICD-10-CM | POA: Diagnosis not present

## 2020-01-19 DIAGNOSIS — Z803 Family history of malignant neoplasm of breast: Secondary | ICD-10-CM | POA: Diagnosis not present

## 2020-01-19 DIAGNOSIS — F900 Attention-deficit hyperactivity disorder, predominantly inattentive type: Secondary | ICD-10-CM | POA: Diagnosis not present

## 2020-01-19 DIAGNOSIS — C3411 Malignant neoplasm of upper lobe, right bronchus or lung: Secondary | ICD-10-CM | POA: Diagnosis not present

## 2020-01-19 DIAGNOSIS — M810 Age-related osteoporosis without current pathological fracture: Secondary | ICD-10-CM | POA: Diagnosis not present

## 2020-01-19 DIAGNOSIS — H25813 Combined forms of age-related cataract, bilateral: Secondary | ICD-10-CM | POA: Diagnosis not present

## 2020-01-19 DIAGNOSIS — Z8049 Family history of malignant neoplasm of other genital organs: Secondary | ICD-10-CM | POA: Diagnosis not present

## 2020-01-19 DIAGNOSIS — I2699 Other pulmonary embolism without acute cor pulmonale: Secondary | ICD-10-CM | POA: Diagnosis not present

## 2020-01-19 DIAGNOSIS — Z9882 Breast implant status: Secondary | ICD-10-CM | POA: Diagnosis not present

## 2020-01-19 DIAGNOSIS — N401 Enlarged prostate with lower urinary tract symptoms: Secondary | ICD-10-CM | POA: Diagnosis not present

## 2020-01-19 DIAGNOSIS — T451X5A Adverse effect of antineoplastic and immunosuppressive drugs, initial encounter: Secondary | ICD-10-CM | POA: Diagnosis not present

## 2020-01-19 DIAGNOSIS — Z8551 Personal history of malignant neoplasm of bladder: Secondary | ICD-10-CM | POA: Diagnosis not present

## 2020-01-19 DIAGNOSIS — M5136 Other intervertebral disc degeneration, lumbar region: Secondary | ICD-10-CM | POA: Diagnosis not present

## 2020-01-19 DIAGNOSIS — J80 Acute respiratory distress syndrome: Secondary | ICD-10-CM | POA: Diagnosis not present

## 2020-01-19 DIAGNOSIS — M154 Erosive (osteo)arthritis: Secondary | ICD-10-CM | POA: Diagnosis not present

## 2020-01-19 DIAGNOSIS — G629 Polyneuropathy, unspecified: Secondary | ICD-10-CM | POA: Diagnosis not present

## 2020-01-19 DIAGNOSIS — M1612 Unilateral primary osteoarthritis, left hip: Secondary | ICD-10-CM | POA: Diagnosis not present

## 2020-01-19 DIAGNOSIS — M2550 Pain in unspecified joint: Secondary | ICD-10-CM | POA: Diagnosis not present

## 2020-01-19 DIAGNOSIS — Z20822 Contact with and (suspected) exposure to covid-19: Secondary | ICD-10-CM | POA: Diagnosis not present

## 2020-01-19 DIAGNOSIS — Z5189 Encounter for other specified aftercare: Secondary | ICD-10-CM | POA: Diagnosis not present

## 2020-01-19 DIAGNOSIS — R0789 Other chest pain: Secondary | ICD-10-CM | POA: Diagnosis not present

## 2020-01-19 DIAGNOSIS — M4726 Other spondylosis with radiculopathy, lumbar region: Secondary | ICD-10-CM | POA: Diagnosis not present

## 2020-01-19 DIAGNOSIS — N189 Chronic kidney disease, unspecified: Secondary | ICD-10-CM | POA: Diagnosis not present

## 2020-01-19 DIAGNOSIS — I213 ST elevation (STEMI) myocardial infarction of unspecified site: Secondary | ICD-10-CM | POA: Diagnosis not present

## 2020-01-19 DIAGNOSIS — M5442 Lumbago with sciatica, left side: Secondary | ICD-10-CM | POA: Diagnosis not present

## 2020-01-19 DIAGNOSIS — R1310 Dysphagia, unspecified: Secondary | ICD-10-CM | POA: Diagnosis not present

## 2020-01-19 DIAGNOSIS — Z7982 Long term (current) use of aspirin: Secondary | ICD-10-CM | POA: Diagnosis not present

## 2020-01-19 DIAGNOSIS — R7989 Other specified abnormal findings of blood chemistry: Secondary | ICD-10-CM | POA: Diagnosis not present

## 2020-01-19 DIAGNOSIS — R310 Gross hematuria: Secondary | ICD-10-CM | POA: Diagnosis not present

## 2020-01-19 DIAGNOSIS — M899 Disorder of bone, unspecified: Secondary | ICD-10-CM | POA: Diagnosis not present

## 2020-01-19 DIAGNOSIS — E1136 Type 2 diabetes mellitus with diabetic cataract: Secondary | ICD-10-CM | POA: Diagnosis not present

## 2020-01-19 DIAGNOSIS — Z9889 Other specified postprocedural states: Secondary | ICD-10-CM | POA: Diagnosis not present

## 2020-01-19 DIAGNOSIS — G459 Transient cerebral ischemic attack, unspecified: Secondary | ICD-10-CM | POA: Diagnosis not present

## 2020-01-19 DIAGNOSIS — Z8546 Personal history of malignant neoplasm of prostate: Secondary | ICD-10-CM | POA: Diagnosis not present

## 2020-01-19 DIAGNOSIS — E1159 Type 2 diabetes mellitus with other circulatory complications: Secondary | ICD-10-CM | POA: Diagnosis not present

## 2020-01-19 DIAGNOSIS — Z96652 Presence of left artificial knee joint: Secondary | ICD-10-CM | POA: Diagnosis not present

## 2020-01-19 DIAGNOSIS — Z4789 Encounter for other orthopedic aftercare: Secondary | ICD-10-CM | POA: Diagnosis not present

## 2020-01-19 DIAGNOSIS — N321 Vesicointestinal fistula: Secondary | ICD-10-CM | POA: Diagnosis not present

## 2020-01-19 DIAGNOSIS — Z6841 Body Mass Index (BMI) 40.0 and over, adult: Secondary | ICD-10-CM | POA: Diagnosis not present

## 2020-01-19 DIAGNOSIS — T82594A Other mechanical complication of infusion catheter, initial encounter: Secondary | ICD-10-CM | POA: Diagnosis not present

## 2020-01-19 DIAGNOSIS — J939 Pneumothorax, unspecified: Secondary | ICD-10-CM | POA: Diagnosis not present

## 2020-01-19 DIAGNOSIS — J438 Other emphysema: Secondary | ICD-10-CM | POA: Diagnosis not present

## 2020-01-19 DIAGNOSIS — Z171 Estrogen receptor negative status [ER-]: Secondary | ICD-10-CM | POA: Diagnosis not present

## 2020-01-19 DIAGNOSIS — M797 Fibromyalgia: Secondary | ICD-10-CM | POA: Diagnosis not present

## 2020-01-19 DIAGNOSIS — Z125 Encounter for screening for malignant neoplasm of prostate: Secondary | ICD-10-CM | POA: Diagnosis not present

## 2020-01-19 DIAGNOSIS — C50912 Malignant neoplasm of unspecified site of left female breast: Secondary | ICD-10-CM | POA: Diagnosis not present

## 2020-01-19 DIAGNOSIS — R030 Elevated blood-pressure reading, without diagnosis of hypertension: Secondary | ICD-10-CM | POA: Diagnosis not present

## 2020-01-19 DIAGNOSIS — E785 Hyperlipidemia, unspecified: Secondary | ICD-10-CM | POA: Diagnosis not present

## 2020-01-19 DIAGNOSIS — Z79890 Hormone replacement therapy (postmenopausal): Secondary | ICD-10-CM | POA: Diagnosis not present

## 2020-01-19 DIAGNOSIS — J1282 Pneumonia due to coronavirus disease 2019: Secondary | ICD-10-CM | POA: Diagnosis not present

## 2020-01-19 DIAGNOSIS — I2510 Atherosclerotic heart disease of native coronary artery without angina pectoris: Secondary | ICD-10-CM | POA: Diagnosis not present

## 2020-01-19 DIAGNOSIS — I249 Acute ischemic heart disease, unspecified: Secondary | ICD-10-CM | POA: Diagnosis not present

## 2020-01-19 DIAGNOSIS — M9702XD Periprosthetic fracture around internal prosthetic left hip joint, subsequent encounter: Secondary | ICD-10-CM | POA: Diagnosis not present

## 2020-01-19 DIAGNOSIS — D519 Vitamin B12 deficiency anemia, unspecified: Secondary | ICD-10-CM | POA: Diagnosis not present

## 2020-01-19 DIAGNOSIS — C787 Secondary malignant neoplasm of liver and intrahepatic bile duct: Secondary | ICD-10-CM | POA: Diagnosis not present

## 2020-01-19 DIAGNOSIS — S72001D Fracture of unspecified part of neck of right femur, subsequent encounter for closed fracture with routine healing: Secondary | ICD-10-CM | POA: Diagnosis not present

## 2020-01-19 DIAGNOSIS — I82451 Acute embolism and thrombosis of right peroneal vein: Secondary | ICD-10-CM | POA: Diagnosis not present

## 2020-01-19 DIAGNOSIS — Z6825 Body mass index (BMI) 25.0-25.9, adult: Secondary | ICD-10-CM | POA: Diagnosis not present

## 2020-01-19 DIAGNOSIS — M25561 Pain in right knee: Secondary | ICD-10-CM | POA: Diagnosis not present

## 2020-01-19 DIAGNOSIS — R142 Eructation: Secondary | ICD-10-CM | POA: Diagnosis not present

## 2020-01-19 DIAGNOSIS — M9905 Segmental and somatic dysfunction of pelvic region: Secondary | ICD-10-CM | POA: Diagnosis not present

## 2020-01-19 DIAGNOSIS — F418 Other specified anxiety disorders: Secondary | ICD-10-CM | POA: Diagnosis not present

## 2020-01-19 DIAGNOSIS — N410 Acute prostatitis: Secondary | ICD-10-CM | POA: Diagnosis not present

## 2020-01-19 DIAGNOSIS — R590 Localized enlarged lymph nodes: Secondary | ICD-10-CM | POA: Diagnosis not present

## 2020-01-19 DIAGNOSIS — M9901 Segmental and somatic dysfunction of cervical region: Secondary | ICD-10-CM | POA: Diagnosis not present

## 2020-01-19 DIAGNOSIS — C155 Malignant neoplasm of lower third of esophagus: Secondary | ICD-10-CM | POA: Diagnosis not present

## 2020-01-19 DIAGNOSIS — M546 Pain in thoracic spine: Secondary | ICD-10-CM | POA: Diagnosis not present

## 2020-01-19 DIAGNOSIS — H35363 Drusen (degenerative) of macula, bilateral: Secondary | ICD-10-CM | POA: Diagnosis not present

## 2020-01-19 DIAGNOSIS — C9110 Chronic lymphocytic leukemia of B-cell type not having achieved remission: Secondary | ICD-10-CM | POA: Diagnosis not present

## 2020-01-19 DIAGNOSIS — E1165 Type 2 diabetes mellitus with hyperglycemia: Secondary | ICD-10-CM | POA: Diagnosis not present

## 2020-01-19 DIAGNOSIS — M5441 Lumbago with sciatica, right side: Secondary | ICD-10-CM | POA: Diagnosis not present

## 2020-01-19 DIAGNOSIS — I10 Essential (primary) hypertension: Secondary | ICD-10-CM | POA: Diagnosis not present

## 2020-01-19 DIAGNOSIS — I499 Cardiac arrhythmia, unspecified: Secondary | ICD-10-CM | POA: Diagnosis not present

## 2020-01-19 DIAGNOSIS — Z9013 Acquired absence of bilateral breasts and nipples: Secondary | ICD-10-CM | POA: Diagnosis not present

## 2020-01-19 DIAGNOSIS — D500 Iron deficiency anemia secondary to blood loss (chronic): Secondary | ICD-10-CM | POA: Diagnosis not present

## 2020-01-19 DIAGNOSIS — J4541 Moderate persistent asthma with (acute) exacerbation: Secondary | ICD-10-CM | POA: Diagnosis not present

## 2020-01-19 DIAGNOSIS — M5137 Other intervertebral disc degeneration, lumbosacral region: Secondary | ICD-10-CM | POA: Diagnosis not present

## 2020-01-19 DIAGNOSIS — F329 Major depressive disorder, single episode, unspecified: Secondary | ICD-10-CM | POA: Diagnosis not present

## 2020-01-19 DIAGNOSIS — Z7952 Long term (current) use of systemic steroids: Secondary | ICD-10-CM | POA: Diagnosis not present

## 2020-01-19 DIAGNOSIS — R180 Malignant ascites: Secondary | ICD-10-CM | POA: Diagnosis not present

## 2020-01-19 DIAGNOSIS — E782 Mixed hyperlipidemia: Secondary | ICD-10-CM | POA: Diagnosis not present

## 2020-01-19 DIAGNOSIS — D6481 Anemia due to antineoplastic chemotherapy: Secondary | ICD-10-CM | POA: Diagnosis not present

## 2020-01-19 DIAGNOSIS — I82442 Acute embolism and thrombosis of left tibial vein: Secondary | ICD-10-CM | POA: Diagnosis not present

## 2020-01-19 DIAGNOSIS — S41101D Unspecified open wound of right upper arm, subsequent encounter: Secondary | ICD-10-CM | POA: Diagnosis not present

## 2020-01-19 DIAGNOSIS — S61401D Unspecified open wound of right hand, subsequent encounter: Secondary | ICD-10-CM | POA: Diagnosis not present

## 2020-01-19 DIAGNOSIS — L02429 Furuncle of limb, unspecified: Secondary | ICD-10-CM | POA: Diagnosis not present

## 2020-01-19 DIAGNOSIS — R21 Rash and other nonspecific skin eruption: Secondary | ICD-10-CM | POA: Diagnosis not present

## 2020-01-19 DIAGNOSIS — Z89611 Acquired absence of right leg above knee: Secondary | ICD-10-CM | POA: Diagnosis not present

## 2020-01-19 DIAGNOSIS — I872 Venous insufficiency (chronic) (peripheral): Secondary | ICD-10-CM | POA: Diagnosis not present

## 2020-01-19 DIAGNOSIS — H35453 Secondary pigmentary degeneration, bilateral: Secondary | ICD-10-CM | POA: Diagnosis not present

## 2020-01-19 DIAGNOSIS — I5032 Chronic diastolic (congestive) heart failure: Secondary | ICD-10-CM | POA: Diagnosis not present

## 2020-01-19 DIAGNOSIS — L409 Psoriasis, unspecified: Secondary | ICD-10-CM | POA: Diagnosis not present

## 2020-01-19 DIAGNOSIS — F332 Major depressive disorder, recurrent severe without psychotic features: Secondary | ICD-10-CM | POA: Diagnosis not present

## 2020-01-19 DIAGNOSIS — E876 Hypokalemia: Secondary | ICD-10-CM | POA: Diagnosis not present

## 2020-01-19 DIAGNOSIS — M545 Low back pain: Secondary | ICD-10-CM | POA: Diagnosis not present

## 2020-01-19 DIAGNOSIS — C541 Malignant neoplasm of endometrium: Secondary | ICD-10-CM | POA: Diagnosis not present

## 2020-01-19 DIAGNOSIS — G510 Bell's palsy: Secondary | ICD-10-CM | POA: Diagnosis not present

## 2020-01-19 DIAGNOSIS — R69 Illness, unspecified: Secondary | ICD-10-CM | POA: Diagnosis not present

## 2020-01-19 DIAGNOSIS — F251 Schizoaffective disorder, depressive type: Secondary | ICD-10-CM | POA: Diagnosis not present

## 2020-01-19 DIAGNOSIS — Z743 Need for continuous supervision: Secondary | ICD-10-CM | POA: Diagnosis not present

## 2020-01-19 DIAGNOSIS — J90 Pleural effusion, not elsewhere classified: Secondary | ICD-10-CM | POA: Diagnosis not present

## 2020-01-19 DIAGNOSIS — N1832 Chronic kidney disease, stage 3b: Secondary | ICD-10-CM | POA: Diagnosis not present

## 2020-01-19 DIAGNOSIS — C50212 Malignant neoplasm of upper-inner quadrant of left female breast: Secondary | ICD-10-CM | POA: Diagnosis not present

## 2020-01-19 DIAGNOSIS — M9902 Segmental and somatic dysfunction of thoracic region: Secondary | ICD-10-CM | POA: Diagnosis not present

## 2020-01-19 DIAGNOSIS — R630 Anorexia: Secondary | ICD-10-CM | POA: Diagnosis not present

## 2020-01-19 DIAGNOSIS — D509 Iron deficiency anemia, unspecified: Secondary | ICD-10-CM | POA: Diagnosis not present

## 2020-01-19 DIAGNOSIS — Z800 Family history of malignant neoplasm of digestive organs: Secondary | ICD-10-CM | POA: Diagnosis not present

## 2020-01-19 DIAGNOSIS — Z5111 Encounter for antineoplastic chemotherapy: Secondary | ICD-10-CM | POA: Diagnosis not present

## 2020-01-19 DIAGNOSIS — M549 Dorsalgia, unspecified: Secondary | ICD-10-CM | POA: Diagnosis not present

## 2020-01-19 DIAGNOSIS — C4359 Malignant melanoma of other part of trunk: Secondary | ICD-10-CM | POA: Diagnosis not present

## 2020-01-19 DIAGNOSIS — L89621 Pressure ulcer of left heel, stage 1: Secondary | ICD-10-CM | POA: Diagnosis not present

## 2020-01-19 DIAGNOSIS — Z9861 Coronary angioplasty status: Secondary | ICD-10-CM | POA: Diagnosis not present

## 2020-01-19 DIAGNOSIS — E213 Hyperparathyroidism, unspecified: Secondary | ICD-10-CM | POA: Diagnosis not present

## 2020-01-19 DIAGNOSIS — C37 Malignant neoplasm of thymus: Secondary | ICD-10-CM | POA: Diagnosis not present

## 2020-01-19 DIAGNOSIS — N183 Chronic kidney disease, stage 3 unspecified: Secondary | ICD-10-CM | POA: Diagnosis not present

## 2020-01-19 DIAGNOSIS — R279 Unspecified lack of coordination: Secondary | ICD-10-CM | POA: Diagnosis not present

## 2020-01-19 DIAGNOSIS — J42 Unspecified chronic bronchitis: Secondary | ICD-10-CM | POA: Diagnosis not present

## 2020-01-19 DIAGNOSIS — E1140 Type 2 diabetes mellitus with diabetic neuropathy, unspecified: Secondary | ICD-10-CM | POA: Diagnosis not present

## 2020-01-19 DIAGNOSIS — E1122 Type 2 diabetes mellitus with diabetic chronic kidney disease: Secondary | ICD-10-CM | POA: Diagnosis not present

## 2020-01-19 DIAGNOSIS — U071 COVID-19: Secondary | ICD-10-CM | POA: Diagnosis not present

## 2020-01-19 DIAGNOSIS — Z452 Encounter for adjustment and management of vascular access device: Secondary | ICD-10-CM | POA: Diagnosis not present

## 2020-01-19 DIAGNOSIS — A400 Sepsis due to streptococcus, group A: Secondary | ICD-10-CM | POA: Diagnosis not present

## 2020-01-19 DIAGNOSIS — E7849 Other hyperlipidemia: Secondary | ICD-10-CM | POA: Diagnosis not present

## 2020-01-19 DIAGNOSIS — N390 Urinary tract infection, site not specified: Secondary | ICD-10-CM | POA: Diagnosis not present

## 2020-01-19 DIAGNOSIS — Z86010 Personal history of colonic polyps: Secondary | ICD-10-CM | POA: Diagnosis not present

## 2020-01-19 DIAGNOSIS — Z85820 Personal history of malignant melanoma of skin: Secondary | ICD-10-CM | POA: Diagnosis not present

## 2020-01-19 DIAGNOSIS — I272 Pulmonary hypertension, unspecified: Secondary | ICD-10-CM | POA: Diagnosis not present

## 2020-01-19 DIAGNOSIS — M170 Bilateral primary osteoarthritis of knee: Secondary | ICD-10-CM | POA: Diagnosis not present

## 2020-01-19 DIAGNOSIS — E441 Mild protein-calorie malnutrition: Secondary | ICD-10-CM | POA: Diagnosis not present

## 2020-01-19 DIAGNOSIS — F17210 Nicotine dependence, cigarettes, uncomplicated: Secondary | ICD-10-CM | POA: Diagnosis not present

## 2020-01-19 DIAGNOSIS — M069 Rheumatoid arthritis, unspecified: Secondary | ICD-10-CM | POA: Diagnosis not present

## 2020-01-19 DIAGNOSIS — Z79891 Long term (current) use of opiate analgesic: Secondary | ICD-10-CM | POA: Diagnosis not present

## 2020-01-19 DIAGNOSIS — Z1509 Genetic susceptibility to other malignant neoplasm: Secondary | ICD-10-CM | POA: Diagnosis not present

## 2020-01-19 DIAGNOSIS — L02416 Cutaneous abscess of left lower limb: Secondary | ICD-10-CM | POA: Diagnosis not present

## 2020-01-19 DIAGNOSIS — R110 Nausea: Secondary | ICD-10-CM | POA: Diagnosis not present

## 2020-01-19 DIAGNOSIS — C642 Malignant neoplasm of left kidney, except renal pelvis: Secondary | ICD-10-CM | POA: Diagnosis not present

## 2020-01-19 DIAGNOSIS — N907 Vulvar cyst: Secondary | ICD-10-CM | POA: Diagnosis not present

## 2020-01-19 DIAGNOSIS — M25461 Effusion, right knee: Secondary | ICD-10-CM | POA: Diagnosis not present

## 2020-01-19 DIAGNOSIS — M25552 Pain in left hip: Secondary | ICD-10-CM | POA: Diagnosis not present

## 2020-01-19 DIAGNOSIS — G7000 Myasthenia gravis without (acute) exacerbation: Secondary | ICD-10-CM | POA: Diagnosis not present

## 2020-01-19 DIAGNOSIS — F0390 Unspecified dementia without behavioral disturbance: Secondary | ICD-10-CM | POA: Diagnosis not present

## 2020-01-19 DIAGNOSIS — M955 Acquired deformity of pelvis: Secondary | ICD-10-CM | POA: Diagnosis not present

## 2020-01-19 DIAGNOSIS — M531 Cervicobrachial syndrome: Secondary | ICD-10-CM | POA: Diagnosis not present

## 2020-01-19 DIAGNOSIS — R251 Tremor, unspecified: Secondary | ICD-10-CM | POA: Diagnosis not present

## 2020-01-19 DIAGNOSIS — Z809 Family history of malignant neoplasm, unspecified: Secondary | ICD-10-CM | POA: Diagnosis not present

## 2020-01-19 DIAGNOSIS — H02105 Unspecified ectropion of left lower eyelid: Secondary | ICD-10-CM | POA: Diagnosis not present

## 2020-01-19 DIAGNOSIS — M1990 Unspecified osteoarthritis, unspecified site: Secondary | ICD-10-CM | POA: Diagnosis not present

## 2020-01-19 DIAGNOSIS — Z9181 History of falling: Secondary | ICD-10-CM | POA: Diagnosis not present

## 2020-01-19 DIAGNOSIS — K5909 Other constipation: Secondary | ICD-10-CM | POA: Diagnosis not present

## 2020-01-19 DIAGNOSIS — M25562 Pain in left knee: Secondary | ICD-10-CM | POA: Diagnosis not present

## 2020-01-19 DIAGNOSIS — R293 Abnormal posture: Secondary | ICD-10-CM | POA: Diagnosis not present

## 2020-01-19 DIAGNOSIS — H02204 Unspecified lagophthalmos left upper eyelid: Secondary | ICD-10-CM | POA: Diagnosis not present

## 2020-01-19 DIAGNOSIS — Z5321 Procedure and treatment not carried out due to patient leaving prior to being seen by health care provider: Secondary | ICD-10-CM | POA: Diagnosis not present

## 2020-01-19 DIAGNOSIS — R2681 Unsteadiness on feet: Secondary | ICD-10-CM | POA: Diagnosis not present

## 2020-01-19 DIAGNOSIS — N179 Acute kidney failure, unspecified: Secondary | ICD-10-CM | POA: Diagnosis not present

## 2020-01-19 DIAGNOSIS — L03116 Cellulitis of left lower limb: Secondary | ICD-10-CM | POA: Diagnosis not present

## 2020-01-19 DIAGNOSIS — R079 Chest pain, unspecified: Secondary | ICD-10-CM | POA: Diagnosis not present

## 2020-01-19 DIAGNOSIS — Z1331 Encounter for screening for depression: Secondary | ICD-10-CM | POA: Diagnosis not present

## 2020-01-19 DIAGNOSIS — H353211 Exudative age-related macular degeneration, right eye, with active choroidal neovascularization: Secondary | ICD-10-CM | POA: Diagnosis not present

## 2020-01-19 DIAGNOSIS — Z6834 Body mass index (BMI) 34.0-34.9, adult: Secondary | ICD-10-CM | POA: Diagnosis not present

## 2020-01-19 DIAGNOSIS — Z86711 Personal history of pulmonary embolism: Secondary | ICD-10-CM | POA: Diagnosis not present

## 2020-01-19 DIAGNOSIS — C801 Malignant (primary) neoplasm, unspecified: Secondary | ICD-10-CM | POA: Diagnosis not present

## 2020-01-19 DIAGNOSIS — M542 Cervicalgia: Secondary | ICD-10-CM | POA: Diagnosis not present

## 2020-01-19 DIAGNOSIS — I252 Old myocardial infarction: Secondary | ICD-10-CM | POA: Diagnosis not present

## 2020-01-19 DIAGNOSIS — Z780 Asymptomatic menopausal state: Secondary | ICD-10-CM | POA: Diagnosis not present

## 2020-01-19 DIAGNOSIS — K219 Gastro-esophageal reflux disease without esophagitis: Secondary | ICD-10-CM | POA: Diagnosis not present

## 2020-01-19 DIAGNOSIS — Z961 Presence of intraocular lens: Secondary | ICD-10-CM | POA: Diagnosis not present

## 2020-01-19 DIAGNOSIS — H539 Unspecified visual disturbance: Secondary | ICD-10-CM | POA: Diagnosis not present

## 2020-01-19 DIAGNOSIS — Z471 Aftercare following joint replacement surgery: Secondary | ICD-10-CM | POA: Diagnosis not present

## 2020-01-19 DIAGNOSIS — G893 Neoplasm related pain (acute) (chronic): Secondary | ICD-10-CM | POA: Diagnosis not present

## 2020-01-19 DIAGNOSIS — R809 Proteinuria, unspecified: Secondary | ICD-10-CM | POA: Diagnosis not present

## 2020-01-19 DIAGNOSIS — C3432 Malignant neoplasm of lower lobe, left bronchus or lung: Secondary | ICD-10-CM | POA: Diagnosis not present

## 2020-01-19 DIAGNOSIS — N1831 Chronic kidney disease, stage 3a: Secondary | ICD-10-CM | POA: Diagnosis not present

## 2020-01-19 DIAGNOSIS — G620 Drug-induced polyneuropathy: Secondary | ICD-10-CM | POA: Diagnosis not present

## 2020-01-19 DIAGNOSIS — Z0000 Encounter for general adult medical examination without abnormal findings: Secondary | ICD-10-CM | POA: Diagnosis not present

## 2020-01-19 DIAGNOSIS — Z87891 Personal history of nicotine dependence: Secondary | ICD-10-CM | POA: Diagnosis not present

## 2020-01-19 DIAGNOSIS — F419 Anxiety disorder, unspecified: Secondary | ICD-10-CM | POA: Diagnosis not present

## 2020-01-19 DIAGNOSIS — D801 Nonfamilial hypogammaglobulinemia: Secondary | ICD-10-CM | POA: Diagnosis not present

## 2020-01-19 DIAGNOSIS — N824 Other female intestinal-genital tract fistulae: Secondary | ICD-10-CM | POA: Diagnosis not present

## 2020-01-19 DIAGNOSIS — E118 Type 2 diabetes mellitus with unspecified complications: Secondary | ICD-10-CM | POA: Diagnosis not present

## 2020-01-19 DIAGNOSIS — Z8249 Family history of ischemic heart disease and other diseases of the circulatory system: Secondary | ICD-10-CM | POA: Diagnosis not present

## 2020-01-19 DIAGNOSIS — M25651 Stiffness of right hip, not elsewhere classified: Secondary | ICD-10-CM | POA: Diagnosis not present

## 2020-01-20 DIAGNOSIS — E118 Type 2 diabetes mellitus with unspecified complications: Secondary | ICD-10-CM | POA: Diagnosis not present

## 2020-01-20 DIAGNOSIS — Z791 Long term (current) use of non-steroidal anti-inflammatories (NSAID): Secondary | ICD-10-CM | POA: Diagnosis not present

## 2020-01-20 DIAGNOSIS — I872 Venous insufficiency (chronic) (peripheral): Secondary | ICD-10-CM | POA: Diagnosis not present

## 2020-01-20 DIAGNOSIS — C7931 Secondary malignant neoplasm of brain: Secondary | ICD-10-CM | POA: Diagnosis not present

## 2020-01-20 DIAGNOSIS — Z20828 Contact with and (suspected) exposure to other viral communicable diseases: Secondary | ICD-10-CM | POA: Diagnosis not present

## 2020-01-20 DIAGNOSIS — I10 Essential (primary) hypertension: Secondary | ICD-10-CM | POA: Diagnosis not present

## 2020-01-20 DIAGNOSIS — H409 Unspecified glaucoma: Secondary | ICD-10-CM | POA: Diagnosis not present

## 2020-01-20 DIAGNOSIS — M5431 Sciatica, right side: Secondary | ICD-10-CM | POA: Diagnosis not present

## 2020-01-20 DIAGNOSIS — G4733 Obstructive sleep apnea (adult) (pediatric): Secondary | ICD-10-CM | POA: Diagnosis not present

## 2020-01-20 DIAGNOSIS — M5412 Radiculopathy, cervical region: Secondary | ICD-10-CM | POA: Diagnosis not present

## 2020-01-20 DIAGNOSIS — D693 Immune thrombocytopenic purpura: Secondary | ICD-10-CM | POA: Diagnosis not present

## 2020-01-20 DIAGNOSIS — N390 Urinary tract infection, site not specified: Secondary | ICD-10-CM | POA: Diagnosis not present

## 2020-01-20 DIAGNOSIS — G40909 Epilepsy, unspecified, not intractable, without status epilepticus: Secondary | ICD-10-CM | POA: Diagnosis not present

## 2020-01-20 DIAGNOSIS — Z853 Personal history of malignant neoplasm of breast: Secondary | ICD-10-CM | POA: Diagnosis not present

## 2020-01-20 DIAGNOSIS — I119 Hypertensive heart disease without heart failure: Secondary | ICD-10-CM | POA: Diagnosis not present

## 2020-01-20 DIAGNOSIS — C44622 Squamous cell carcinoma of skin of right upper limb, including shoulder: Secondary | ICD-10-CM | POA: Diagnosis not present

## 2020-01-20 DIAGNOSIS — M6281 Muscle weakness (generalized): Secondary | ICD-10-CM | POA: Diagnosis not present

## 2020-01-20 DIAGNOSIS — Z79811 Long term (current) use of aromatase inhibitors: Secondary | ICD-10-CM | POA: Diagnosis not present

## 2020-01-20 DIAGNOSIS — R269 Unspecified abnormalities of gait and mobility: Secondary | ICD-10-CM | POA: Diagnosis not present

## 2020-01-20 DIAGNOSIS — Z992 Dependence on renal dialysis: Secondary | ICD-10-CM | POA: Diagnosis not present

## 2020-01-20 DIAGNOSIS — Z5111 Encounter for antineoplastic chemotherapy: Secondary | ICD-10-CM | POA: Diagnosis not present

## 2020-01-20 DIAGNOSIS — I5032 Chronic diastolic (congestive) heart failure: Secondary | ICD-10-CM | POA: Diagnosis not present

## 2020-01-20 DIAGNOSIS — E11622 Type 2 diabetes mellitus with other skin ulcer: Secondary | ICD-10-CM | POA: Diagnosis not present

## 2020-01-20 DIAGNOSIS — Z170 Estrogen receptor positive status [ER+]: Secondary | ICD-10-CM | POA: Diagnosis not present

## 2020-01-20 DIAGNOSIS — I208 Other forms of angina pectoris: Secondary | ICD-10-CM | POA: Diagnosis not present

## 2020-01-20 DIAGNOSIS — H16041 Marginal corneal ulcer, right eye: Secondary | ICD-10-CM | POA: Diagnosis not present

## 2020-01-20 DIAGNOSIS — L97522 Non-pressure chronic ulcer of other part of left foot with fat layer exposed: Secondary | ICD-10-CM | POA: Diagnosis not present

## 2020-01-20 DIAGNOSIS — I509 Heart failure, unspecified: Secondary | ICD-10-CM | POA: Diagnosis not present

## 2020-01-20 DIAGNOSIS — E039 Hypothyroidism, unspecified: Secondary | ICD-10-CM | POA: Diagnosis not present

## 2020-01-20 DIAGNOSIS — E785 Hyperlipidemia, unspecified: Secondary | ICD-10-CM | POA: Diagnosis not present

## 2020-01-20 DIAGNOSIS — M1990 Unspecified osteoarthritis, unspecified site: Secondary | ICD-10-CM | POA: Diagnosis not present

## 2020-01-20 DIAGNOSIS — E782 Mixed hyperlipidemia: Secondary | ICD-10-CM | POA: Diagnosis not present

## 2020-01-20 DIAGNOSIS — E1149 Type 2 diabetes mellitus with other diabetic neurological complication: Secondary | ICD-10-CM | POA: Diagnosis not present

## 2020-01-20 DIAGNOSIS — Z8579 Personal history of other malignant neoplasms of lymphoid, hematopoietic and related tissues: Secondary | ICD-10-CM | POA: Diagnosis not present

## 2020-01-20 DIAGNOSIS — R1011 Right upper quadrant pain: Secondary | ICD-10-CM | POA: Diagnosis not present

## 2020-01-20 DIAGNOSIS — M109 Gout, unspecified: Secondary | ICD-10-CM | POA: Diagnosis not present

## 2020-01-20 DIAGNOSIS — M48061 Spinal stenosis, lumbar region without neurogenic claudication: Secondary | ICD-10-CM | POA: Diagnosis not present

## 2020-01-20 DIAGNOSIS — R2689 Other abnormalities of gait and mobility: Secondary | ICD-10-CM | POA: Diagnosis not present

## 2020-01-20 DIAGNOSIS — Z923 Personal history of irradiation: Secondary | ICD-10-CM | POA: Diagnosis not present

## 2020-01-20 DIAGNOSIS — M1000 Idiopathic gout, unspecified site: Secondary | ICD-10-CM | POA: Diagnosis not present

## 2020-01-20 DIAGNOSIS — Z7952 Long term (current) use of systemic steroids: Secondary | ICD-10-CM | POA: Diagnosis not present

## 2020-01-20 DIAGNOSIS — H35033 Hypertensive retinopathy, bilateral: Secondary | ICD-10-CM | POA: Diagnosis not present

## 2020-01-20 DIAGNOSIS — M549 Dorsalgia, unspecified: Secondary | ICD-10-CM | POA: Diagnosis not present

## 2020-01-20 DIAGNOSIS — C3411 Malignant neoplasm of upper lobe, right bronchus or lung: Secondary | ICD-10-CM | POA: Diagnosis not present

## 2020-01-20 DIAGNOSIS — D631 Anemia in chronic kidney disease: Secondary | ICD-10-CM | POA: Diagnosis not present

## 2020-01-20 DIAGNOSIS — C50812 Malignant neoplasm of overlapping sites of left female breast: Secondary | ICD-10-CM | POA: Diagnosis not present

## 2020-01-20 DIAGNOSIS — Z6825 Body mass index (BMI) 25.0-25.9, adult: Secondary | ICD-10-CM | POA: Diagnosis not present

## 2020-01-20 DIAGNOSIS — G5601 Carpal tunnel syndrome, right upper limb: Secondary | ICD-10-CM | POA: Diagnosis not present

## 2020-01-20 DIAGNOSIS — I110 Hypertensive heart disease with heart failure: Secondary | ICD-10-CM | POA: Diagnosis not present

## 2020-01-20 DIAGNOSIS — F17210 Nicotine dependence, cigarettes, uncomplicated: Secondary | ICD-10-CM | POA: Diagnosis not present

## 2020-01-20 DIAGNOSIS — I132 Hypertensive heart and chronic kidney disease with heart failure and with stage 5 chronic kidney disease, or end stage renal disease: Secondary | ICD-10-CM | POA: Diagnosis not present

## 2020-01-20 DIAGNOSIS — J9602 Acute respiratory failure with hypercapnia: Secondary | ICD-10-CM | POA: Diagnosis not present

## 2020-01-20 DIAGNOSIS — R531 Weakness: Secondary | ICD-10-CM | POA: Diagnosis not present

## 2020-01-20 DIAGNOSIS — C9002 Multiple myeloma in relapse: Secondary | ICD-10-CM | POA: Diagnosis not present

## 2020-01-20 DIAGNOSIS — Z87891 Personal history of nicotine dependence: Secondary | ICD-10-CM | POA: Diagnosis not present

## 2020-01-20 DIAGNOSIS — Z23 Encounter for immunization: Secondary | ICD-10-CM | POA: Diagnosis not present

## 2020-01-20 DIAGNOSIS — G9341 Metabolic encephalopathy: Secondary | ICD-10-CM | POA: Diagnosis not present

## 2020-01-20 DIAGNOSIS — I82612 Acute embolism and thrombosis of superficial veins of left upper extremity: Secondary | ICD-10-CM | POA: Diagnosis not present

## 2020-01-20 DIAGNOSIS — M62551 Muscle wasting and atrophy, not elsewhere classified, right thigh: Secondary | ICD-10-CM | POA: Diagnosis not present

## 2020-01-20 DIAGNOSIS — R222 Localized swelling, mass and lump, trunk: Secondary | ICD-10-CM | POA: Diagnosis not present

## 2020-01-20 DIAGNOSIS — S8992XD Unspecified injury of left lower leg, subsequent encounter: Secondary | ICD-10-CM | POA: Diagnosis not present

## 2020-01-20 DIAGNOSIS — F0390 Unspecified dementia without behavioral disturbance: Secondary | ICD-10-CM | POA: Diagnosis not present

## 2020-01-20 DIAGNOSIS — R4189 Other symptoms and signs involving cognitive functions and awareness: Secondary | ICD-10-CM | POA: Diagnosis not present

## 2020-01-20 DIAGNOSIS — R590 Localized enlarged lymph nodes: Secondary | ICD-10-CM | POA: Diagnosis not present

## 2020-01-20 DIAGNOSIS — I712 Thoracic aortic aneurysm, without rupture: Secondary | ICD-10-CM | POA: Diagnosis not present

## 2020-01-20 DIAGNOSIS — C44319 Basal cell carcinoma of skin of other parts of face: Secondary | ICD-10-CM | POA: Diagnosis not present

## 2020-01-20 DIAGNOSIS — Z9221 Personal history of antineoplastic chemotherapy: Secondary | ICD-10-CM | POA: Diagnosis not present

## 2020-01-20 DIAGNOSIS — E222 Syndrome of inappropriate secretion of antidiuretic hormone: Secondary | ICD-10-CM | POA: Diagnosis not present

## 2020-01-20 DIAGNOSIS — C3412 Malignant neoplasm of upper lobe, left bronchus or lung: Secondary | ICD-10-CM | POA: Diagnosis not present

## 2020-01-20 DIAGNOSIS — Z7984 Long term (current) use of oral hypoglycemic drugs: Secondary | ICD-10-CM | POA: Diagnosis not present

## 2020-01-20 DIAGNOSIS — Z950 Presence of cardiac pacemaker: Secondary | ICD-10-CM | POA: Diagnosis not present

## 2020-01-20 DIAGNOSIS — E538 Deficiency of other specified B group vitamins: Secondary | ICD-10-CM | POA: Diagnosis not present

## 2020-01-20 DIAGNOSIS — Z7902 Long term (current) use of antithrombotics/antiplatelets: Secondary | ICD-10-CM | POA: Diagnosis not present

## 2020-01-20 DIAGNOSIS — Z452 Encounter for adjustment and management of vascular access device: Secondary | ICD-10-CM | POA: Diagnosis not present

## 2020-01-20 DIAGNOSIS — R6889 Other general symptoms and signs: Secondary | ICD-10-CM | POA: Diagnosis not present

## 2020-01-20 DIAGNOSIS — L308 Other specified dermatitis: Secondary | ICD-10-CM | POA: Diagnosis not present

## 2020-01-20 DIAGNOSIS — E11621 Type 2 diabetes mellitus with foot ulcer: Secondary | ICD-10-CM | POA: Diagnosis not present

## 2020-01-20 DIAGNOSIS — E1121 Type 2 diabetes mellitus with diabetic nephropathy: Secondary | ICD-10-CM | POA: Diagnosis not present

## 2020-01-20 DIAGNOSIS — I890 Lymphedema, not elsewhere classified: Secondary | ICD-10-CM | POA: Diagnosis not present

## 2020-01-20 DIAGNOSIS — J449 Chronic obstructive pulmonary disease, unspecified: Secondary | ICD-10-CM | POA: Diagnosis not present

## 2020-01-20 DIAGNOSIS — D649 Anemia, unspecified: Secondary | ICD-10-CM | POA: Diagnosis not present

## 2020-01-20 DIAGNOSIS — Z85818 Personal history of malignant neoplasm of other sites of lip, oral cavity, and pharynx: Secondary | ICD-10-CM | POA: Diagnosis not present

## 2020-01-20 DIAGNOSIS — Z09 Encounter for follow-up examination after completed treatment for conditions other than malignant neoplasm: Secondary | ICD-10-CM | POA: Diagnosis not present

## 2020-01-20 DIAGNOSIS — E1165 Type 2 diabetes mellitus with hyperglycemia: Secondary | ICD-10-CM | POA: Diagnosis not present

## 2020-01-20 DIAGNOSIS — F1010 Alcohol abuse, uncomplicated: Secondary | ICD-10-CM | POA: Diagnosis not present

## 2020-01-20 DIAGNOSIS — E7849 Other hyperlipidemia: Secondary | ICD-10-CM | POA: Diagnosis not present

## 2020-01-20 DIAGNOSIS — Z85828 Personal history of other malignant neoplasm of skin: Secondary | ICD-10-CM | POA: Diagnosis not present

## 2020-01-20 DIAGNOSIS — K5720 Diverticulitis of large intestine with perforation and abscess without bleeding: Secondary | ICD-10-CM | POA: Diagnosis not present

## 2020-01-20 DIAGNOSIS — I2510 Atherosclerotic heart disease of native coronary artery without angina pectoris: Secondary | ICD-10-CM | POA: Diagnosis not present

## 2020-01-20 DIAGNOSIS — L97821 Non-pressure chronic ulcer of other part of left lower leg limited to breakdown of skin: Secondary | ICD-10-CM | POA: Diagnosis not present

## 2020-01-20 DIAGNOSIS — M25512 Pain in left shoulder: Secondary | ICD-10-CM | POA: Diagnosis not present

## 2020-01-20 DIAGNOSIS — I714 Abdominal aortic aneurysm, without rupture: Secondary | ICD-10-CM | POA: Diagnosis not present

## 2020-01-20 DIAGNOSIS — N183 Chronic kidney disease, stage 3 unspecified: Secondary | ICD-10-CM | POA: Diagnosis not present

## 2020-01-20 DIAGNOSIS — D509 Iron deficiency anemia, unspecified: Secondary | ICD-10-CM | POA: Diagnosis not present

## 2020-01-20 DIAGNOSIS — I129 Hypertensive chronic kidney disease with stage 1 through stage 4 chronic kidney disease, or unspecified chronic kidney disease: Secondary | ICD-10-CM | POA: Diagnosis not present

## 2020-01-20 DIAGNOSIS — R0902 Hypoxemia: Secondary | ICD-10-CM | POA: Diagnosis not present

## 2020-01-20 DIAGNOSIS — H35042 Retinal micro-aneurysms, unspecified, left eye: Secondary | ICD-10-CM | POA: Diagnosis not present

## 2020-01-20 DIAGNOSIS — Z7901 Long term (current) use of anticoagulants: Secondary | ICD-10-CM | POA: Diagnosis not present

## 2020-01-20 DIAGNOSIS — I87302 Chronic venous hypertension (idiopathic) without complications of left lower extremity: Secondary | ICD-10-CM | POA: Diagnosis not present

## 2020-01-20 DIAGNOSIS — G20 Parkinson's disease: Secondary | ICD-10-CM | POA: Diagnosis not present

## 2020-01-20 DIAGNOSIS — C210 Malignant neoplasm of anus, unspecified: Secondary | ICD-10-CM | POA: Diagnosis not present

## 2020-01-20 DIAGNOSIS — R4182 Altered mental status, unspecified: Secondary | ICD-10-CM | POA: Diagnosis not present

## 2020-01-20 DIAGNOSIS — Z801 Family history of malignant neoplasm of trachea, bronchus and lung: Secondary | ICD-10-CM | POA: Diagnosis not present

## 2020-01-20 DIAGNOSIS — E1140 Type 2 diabetes mellitus with diabetic neuropathy, unspecified: Secondary | ICD-10-CM | POA: Diagnosis not present

## 2020-01-20 DIAGNOSIS — L400 Psoriasis vulgaris: Secondary | ICD-10-CM | POA: Diagnosis not present

## 2020-01-20 DIAGNOSIS — R600 Localized edema: Secondary | ICD-10-CM | POA: Diagnosis not present

## 2020-01-20 DIAGNOSIS — N529 Male erectile dysfunction, unspecified: Secondary | ICD-10-CM | POA: Diagnosis not present

## 2020-01-20 DIAGNOSIS — M1712 Unilateral primary osteoarthritis, left knee: Secondary | ICD-10-CM | POA: Diagnosis not present

## 2020-01-20 DIAGNOSIS — N182 Chronic kidney disease, stage 2 (mild): Secondary | ICD-10-CM | POA: Diagnosis not present

## 2020-01-20 DIAGNOSIS — G8220 Paraplegia, unspecified: Secondary | ICD-10-CM | POA: Diagnosis not present

## 2020-01-20 DIAGNOSIS — Z510 Encounter for antineoplastic radiation therapy: Secondary | ICD-10-CM | POA: Diagnosis not present

## 2020-01-20 DIAGNOSIS — Z8673 Personal history of transient ischemic attack (TIA), and cerebral infarction without residual deficits: Secondary | ICD-10-CM | POA: Diagnosis not present

## 2020-01-20 DIAGNOSIS — Z794 Long term (current) use of insulin: Secondary | ICD-10-CM | POA: Diagnosis not present

## 2020-01-20 DIAGNOSIS — I252 Old myocardial infarction: Secondary | ICD-10-CM | POA: Diagnosis not present

## 2020-01-20 DIAGNOSIS — J441 Chronic obstructive pulmonary disease with (acute) exacerbation: Secondary | ICD-10-CM | POA: Diagnosis not present

## 2020-01-20 DIAGNOSIS — R82998 Other abnormal findings in urine: Secondary | ICD-10-CM | POA: Diagnosis not present

## 2020-01-20 DIAGNOSIS — I4891 Unspecified atrial fibrillation: Secondary | ICD-10-CM | POA: Diagnosis not present

## 2020-01-20 DIAGNOSIS — I130 Hypertensive heart and chronic kidney disease with heart failure and stage 1 through stage 4 chronic kidney disease, or unspecified chronic kidney disease: Secondary | ICD-10-CM | POA: Diagnosis not present

## 2020-01-20 DIAGNOSIS — C434 Malignant melanoma of scalp and neck: Secondary | ICD-10-CM | POA: Diagnosis not present

## 2020-01-20 DIAGNOSIS — R55 Syncope and collapse: Secondary | ICD-10-CM | POA: Diagnosis not present

## 2020-01-20 DIAGNOSIS — I5042 Chronic combined systolic (congestive) and diastolic (congestive) heart failure: Secondary | ICD-10-CM | POA: Diagnosis not present

## 2020-01-20 DIAGNOSIS — E1122 Type 2 diabetes mellitus with diabetic chronic kidney disease: Secondary | ICD-10-CM | POA: Diagnosis not present

## 2020-01-20 DIAGNOSIS — C9310 Chronic myelomonocytic leukemia not having achieved remission: Secondary | ICD-10-CM | POA: Diagnosis not present

## 2020-01-20 DIAGNOSIS — Z5112 Encounter for antineoplastic immunotherapy: Secondary | ICD-10-CM | POA: Diagnosis not present

## 2020-01-20 DIAGNOSIS — M810 Age-related osteoporosis without current pathological fracture: Secondary | ICD-10-CM | POA: Diagnosis not present

## 2020-01-20 DIAGNOSIS — R5383 Other fatigue: Secondary | ICD-10-CM | POA: Diagnosis not present

## 2020-01-20 DIAGNOSIS — D0511 Intraductal carcinoma in situ of right breast: Secondary | ICD-10-CM | POA: Diagnosis not present

## 2020-01-20 DIAGNOSIS — J9610 Chronic respiratory failure, unspecified whether with hypoxia or hypercapnia: Secondary | ICD-10-CM | POA: Diagnosis not present

## 2020-01-20 DIAGNOSIS — C4922 Malignant neoplasm of connective and soft tissue of left lower limb, including hip: Secondary | ICD-10-CM | POA: Diagnosis not present

## 2020-01-20 DIAGNOSIS — M5090 Cervical disc disorder, unspecified, unspecified cervical region: Secondary | ICD-10-CM | POA: Diagnosis not present

## 2020-01-20 DIAGNOSIS — R918 Other nonspecific abnormal finding of lung field: Secondary | ICD-10-CM | POA: Diagnosis not present

## 2020-01-20 DIAGNOSIS — G250 Essential tremor: Secondary | ICD-10-CM | POA: Diagnosis not present

## 2020-01-20 DIAGNOSIS — Z01812 Encounter for preprocedural laboratory examination: Secondary | ICD-10-CM | POA: Diagnosis not present

## 2020-01-20 DIAGNOSIS — Z7951 Long term (current) use of inhaled steroids: Secondary | ICD-10-CM | POA: Diagnosis not present

## 2020-01-20 DIAGNOSIS — Z955 Presence of coronary angioplasty implant and graft: Secondary | ICD-10-CM | POA: Diagnosis not present

## 2020-01-20 DIAGNOSIS — J432 Centrilobular emphysema: Secondary | ICD-10-CM | POA: Diagnosis not present

## 2020-01-20 DIAGNOSIS — K589 Irritable bowel syndrome without diarrhea: Secondary | ICD-10-CM | POA: Diagnosis not present

## 2020-01-20 DIAGNOSIS — E876 Hypokalemia: Secondary | ICD-10-CM | POA: Diagnosis not present

## 2020-01-20 DIAGNOSIS — Z7409 Other reduced mobility: Secondary | ICD-10-CM | POA: Diagnosis not present

## 2020-01-20 DIAGNOSIS — C44222 Squamous cell carcinoma of skin of right ear and external auricular canal: Secondary | ICD-10-CM | POA: Diagnosis not present

## 2020-01-20 DIAGNOSIS — L820 Inflamed seborrheic keratosis: Secondary | ICD-10-CM | POA: Diagnosis not present

## 2020-01-20 DIAGNOSIS — M25612 Stiffness of left shoulder, not elsewhere classified: Secondary | ICD-10-CM | POA: Diagnosis not present

## 2020-01-20 DIAGNOSIS — K219 Gastro-esophageal reflux disease without esophagitis: Secondary | ICD-10-CM | POA: Diagnosis not present

## 2020-01-20 DIAGNOSIS — Z7982 Long term (current) use of aspirin: Secondary | ICD-10-CM | POA: Diagnosis not present

## 2020-01-20 DIAGNOSIS — C50919 Malignant neoplasm of unspecified site of unspecified female breast: Secondary | ICD-10-CM | POA: Diagnosis not present

## 2020-01-20 DIAGNOSIS — Z86718 Personal history of other venous thrombosis and embolism: Secondary | ICD-10-CM | POA: Diagnosis not present

## 2020-01-20 DIAGNOSIS — Z006 Encounter for examination for normal comparison and control in clinical research program: Secondary | ICD-10-CM | POA: Diagnosis not present

## 2020-01-20 DIAGNOSIS — C50512 Malignant neoplasm of lower-outer quadrant of left female breast: Secondary | ICD-10-CM | POA: Diagnosis not present

## 2020-01-20 DIAGNOSIS — M8580 Other specified disorders of bone density and structure, unspecified site: Secondary | ICD-10-CM | POA: Diagnosis not present

## 2020-01-20 DIAGNOSIS — R609 Edema, unspecified: Secondary | ICD-10-CM | POA: Diagnosis not present

## 2020-01-20 DIAGNOSIS — Z79899 Other long term (current) drug therapy: Secondary | ICD-10-CM | POA: Diagnosis not present

## 2020-01-20 DIAGNOSIS — H2513 Age-related nuclear cataract, bilateral: Secondary | ICD-10-CM | POA: Diagnosis not present

## 2020-01-20 DIAGNOSIS — E7800 Pure hypercholesterolemia, unspecified: Secondary | ICD-10-CM | POA: Diagnosis not present

## 2020-01-20 DIAGNOSIS — C250 Malignant neoplasm of head of pancreas: Secondary | ICD-10-CM | POA: Diagnosis not present

## 2020-01-20 DIAGNOSIS — C259 Malignant neoplasm of pancreas, unspecified: Secondary | ICD-10-CM | POA: Diagnosis not present

## 2020-01-20 DIAGNOSIS — L97912 Non-pressure chronic ulcer of unspecified part of right lower leg with fat layer exposed: Secondary | ICD-10-CM | POA: Diagnosis not present

## 2020-01-20 DIAGNOSIS — F419 Anxiety disorder, unspecified: Secondary | ICD-10-CM | POA: Diagnosis not present

## 2020-01-20 DIAGNOSIS — D473 Essential (hemorrhagic) thrombocythemia: Secondary | ICD-10-CM | POA: Diagnosis not present

## 2020-01-20 DIAGNOSIS — M4722 Other spondylosis with radiculopathy, cervical region: Secondary | ICD-10-CM | POA: Diagnosis not present

## 2020-01-20 DIAGNOSIS — M47812 Spondylosis without myelopathy or radiculopathy, cervical region: Secondary | ICD-10-CM | POA: Diagnosis not present

## 2020-01-20 DIAGNOSIS — C50411 Malignant neoplasm of upper-outer quadrant of right female breast: Secondary | ICD-10-CM | POA: Diagnosis not present

## 2020-01-20 DIAGNOSIS — C61 Malignant neoplasm of prostate: Secondary | ICD-10-CM | POA: Diagnosis not present

## 2020-01-20 DIAGNOSIS — Z0000 Encounter for general adult medical examination without abnormal findings: Secondary | ICD-10-CM | POA: Diagnosis not present

## 2020-01-20 DIAGNOSIS — Z4689 Encounter for fitting and adjustment of other specified devices: Secondary | ICD-10-CM | POA: Diagnosis not present

## 2020-01-20 DIAGNOSIS — F329 Major depressive disorder, single episode, unspecified: Secondary | ICD-10-CM | POA: Diagnosis not present

## 2020-01-20 DIAGNOSIS — M899 Disorder of bone, unspecified: Secondary | ICD-10-CM | POA: Diagnosis not present

## 2020-01-20 DIAGNOSIS — R0602 Shortness of breath: Secondary | ICD-10-CM | POA: Diagnosis not present

## 2020-01-20 DIAGNOSIS — C9110 Chronic lymphocytic leukemia of B-cell type not having achieved remission: Secondary | ICD-10-CM | POA: Diagnosis not present

## 2020-01-20 DIAGNOSIS — R251 Tremor, unspecified: Secondary | ICD-10-CM | POA: Diagnosis not present

## 2020-01-20 DIAGNOSIS — E119 Type 2 diabetes mellitus without complications: Secondary | ICD-10-CM | POA: Diagnosis not present

## 2020-01-20 DIAGNOSIS — I5043 Acute on chronic combined systolic (congestive) and diastolic (congestive) heart failure: Secondary | ICD-10-CM | POA: Diagnosis not present

## 2020-01-20 DIAGNOSIS — G8929 Other chronic pain: Secondary | ICD-10-CM | POA: Diagnosis not present

## 2020-01-20 DIAGNOSIS — R451 Restlessness and agitation: Secondary | ICD-10-CM | POA: Diagnosis not present

## 2020-01-20 DIAGNOSIS — N189 Chronic kidney disease, unspecified: Secondary | ICD-10-CM | POA: Diagnosis not present

## 2020-01-20 DIAGNOSIS — Z1159 Encounter for screening for other viral diseases: Secondary | ICD-10-CM | POA: Diagnosis not present

## 2020-01-20 DIAGNOSIS — C672 Malignant neoplasm of lateral wall of bladder: Secondary | ICD-10-CM | POA: Diagnosis not present

## 2020-01-20 DIAGNOSIS — M62552 Muscle wasting and atrophy, not elsewhere classified, left thigh: Secondary | ICD-10-CM | POA: Diagnosis not present

## 2020-01-20 DIAGNOSIS — M545 Low back pain: Secondary | ICD-10-CM | POA: Diagnosis not present

## 2020-01-20 DIAGNOSIS — E11311 Type 2 diabetes mellitus with unspecified diabetic retinopathy with macular edema: Secondary | ICD-10-CM | POA: Diagnosis not present

## 2020-01-20 DIAGNOSIS — R6883 Chills (without fever): Secondary | ICD-10-CM | POA: Diagnosis not present

## 2020-01-20 DIAGNOSIS — J4520 Mild intermittent asthma, uncomplicated: Secondary | ICD-10-CM | POA: Diagnosis not present

## 2020-01-20 DIAGNOSIS — R7982 Elevated C-reactive protein (CRP): Secondary | ICD-10-CM | POA: Diagnosis not present

## 2020-01-20 DIAGNOSIS — Z515 Encounter for palliative care: Secondary | ICD-10-CM | POA: Diagnosis not present

## 2020-01-20 DIAGNOSIS — F339 Major depressive disorder, recurrent, unspecified: Secondary | ICD-10-CM | POA: Diagnosis not present

## 2020-01-20 DIAGNOSIS — N184 Chronic kidney disease, stage 4 (severe): Secondary | ICD-10-CM | POA: Diagnosis not present

## 2020-01-20 DIAGNOSIS — Z8249 Family history of ischemic heart disease and other diseases of the circulatory system: Secondary | ICD-10-CM | POA: Diagnosis not present

## 2020-01-20 DIAGNOSIS — I639 Cerebral infarction, unspecified: Secondary | ICD-10-CM | POA: Diagnosis not present

## 2020-01-20 DIAGNOSIS — R5381 Other malaise: Secondary | ICD-10-CM | POA: Diagnosis not present

## 2020-01-20 DIAGNOSIS — N179 Acute kidney failure, unspecified: Secondary | ICD-10-CM | POA: Diagnosis not present

## 2020-01-20 DIAGNOSIS — K828 Other specified diseases of gallbladder: Secondary | ICD-10-CM | POA: Diagnosis not present

## 2020-01-20 DIAGNOSIS — R339 Retention of urine, unspecified: Secondary | ICD-10-CM | POA: Diagnosis not present

## 2020-01-20 DIAGNOSIS — Z951 Presence of aortocoronary bypass graft: Secondary | ICD-10-CM | POA: Diagnosis not present

## 2020-01-20 DIAGNOSIS — Z171 Estrogen receptor negative status [ER-]: Secondary | ICD-10-CM | POA: Diagnosis not present

## 2020-01-20 DIAGNOSIS — C50312 Malignant neoplasm of lower-inner quadrant of left female breast: Secondary | ICD-10-CM | POA: Diagnosis not present

## 2020-01-20 DIAGNOSIS — D72825 Bandemia: Secondary | ICD-10-CM | POA: Diagnosis not present

## 2020-01-20 DIAGNOSIS — I351 Nonrheumatic aortic (valve) insufficiency: Secondary | ICD-10-CM | POA: Diagnosis not present

## 2020-01-20 DIAGNOSIS — E1169 Type 2 diabetes mellitus with other specified complication: Secondary | ICD-10-CM | POA: Diagnosis not present

## 2020-01-20 DIAGNOSIS — J189 Pneumonia, unspecified organism: Secondary | ICD-10-CM | POA: Diagnosis not present

## 2020-01-20 DIAGNOSIS — C786 Secondary malignant neoplasm of retroperitoneum and peritoneum: Secondary | ICD-10-CM | POA: Diagnosis not present

## 2020-01-20 DIAGNOSIS — Z20822 Contact with and (suspected) exposure to covid-19: Secondary | ICD-10-CM | POA: Diagnosis not present

## 2020-01-20 DIAGNOSIS — N186 End stage renal disease: Secondary | ICD-10-CM | POA: Diagnosis not present

## 2020-01-20 DIAGNOSIS — J9601 Acute respiratory failure with hypoxia: Secondary | ICD-10-CM | POA: Diagnosis not present

## 2020-01-21 DIAGNOSIS — N182 Chronic kidney disease, stage 2 (mild): Secondary | ICD-10-CM | POA: Diagnosis not present

## 2020-01-21 DIAGNOSIS — R600 Localized edema: Secondary | ICD-10-CM | POA: Diagnosis not present

## 2020-01-21 DIAGNOSIS — E538 Deficiency of other specified B group vitamins: Secondary | ICD-10-CM | POA: Diagnosis not present

## 2020-01-21 DIAGNOSIS — Z7984 Long term (current) use of oral hypoglycemic drugs: Secondary | ICD-10-CM | POA: Diagnosis not present

## 2020-01-21 DIAGNOSIS — I25119 Atherosclerotic heart disease of native coronary artery with unspecified angina pectoris: Secondary | ICD-10-CM | POA: Diagnosis not present

## 2020-01-21 DIAGNOSIS — F068 Other specified mental disorders due to known physiological condition: Secondary | ICD-10-CM | POA: Diagnosis not present

## 2020-01-21 DIAGNOSIS — L03311 Cellulitis of abdominal wall: Secondary | ICD-10-CM | POA: Diagnosis not present

## 2020-01-21 DIAGNOSIS — L89899 Pressure ulcer of other site, unspecified stage: Secondary | ICD-10-CM | POA: Diagnosis not present

## 2020-01-21 DIAGNOSIS — Z8701 Personal history of pneumonia (recurrent): Secondary | ICD-10-CM | POA: Diagnosis not present

## 2020-01-21 DIAGNOSIS — D696 Thrombocytopenia, unspecified: Secondary | ICD-10-CM | POA: Diagnosis not present

## 2020-01-21 DIAGNOSIS — K760 Fatty (change of) liver, not elsewhere classified: Secondary | ICD-10-CM | POA: Diagnosis not present

## 2020-01-21 DIAGNOSIS — Z825 Family history of asthma and other chronic lower respiratory diseases: Secondary | ICD-10-CM | POA: Diagnosis not present

## 2020-01-21 DIAGNOSIS — Z03818 Encounter for observation for suspected exposure to other biological agents ruled out: Secondary | ICD-10-CM | POA: Diagnosis not present

## 2020-01-21 DIAGNOSIS — L98492 Non-pressure chronic ulcer of skin of other sites with fat layer exposed: Secondary | ICD-10-CM | POA: Diagnosis not present

## 2020-01-21 DIAGNOSIS — R1310 Dysphagia, unspecified: Secondary | ICD-10-CM | POA: Diagnosis not present

## 2020-01-21 DIAGNOSIS — E8881 Metabolic syndrome: Secondary | ICD-10-CM | POA: Diagnosis not present

## 2020-01-21 DIAGNOSIS — F17210 Nicotine dependence, cigarettes, uncomplicated: Secondary | ICD-10-CM | POA: Diagnosis not present

## 2020-01-21 DIAGNOSIS — Z7982 Long term (current) use of aspirin: Secondary | ICD-10-CM | POA: Diagnosis not present

## 2020-01-21 DIAGNOSIS — Z8673 Personal history of transient ischemic attack (TIA), and cerebral infarction without residual deficits: Secondary | ICD-10-CM | POA: Diagnosis not present

## 2020-01-21 DIAGNOSIS — Z515 Encounter for palliative care: Secondary | ICD-10-CM | POA: Diagnosis not present

## 2020-01-21 DIAGNOSIS — R000 Tachycardia, unspecified: Secondary | ICD-10-CM | POA: Diagnosis not present

## 2020-01-21 DIAGNOSIS — E1122 Type 2 diabetes mellitus with diabetic chronic kidney disease: Secondary | ICD-10-CM | POA: Diagnosis not present

## 2020-01-21 DIAGNOSIS — R590 Localized enlarged lymph nodes: Secondary | ICD-10-CM | POA: Diagnosis not present

## 2020-01-21 DIAGNOSIS — E871 Hypo-osmolality and hyponatremia: Secondary | ICD-10-CM | POA: Diagnosis not present

## 2020-01-21 DIAGNOSIS — R599 Enlarged lymph nodes, unspecified: Secondary | ICD-10-CM | POA: Diagnosis not present

## 2020-01-21 DIAGNOSIS — R946 Abnormal results of thyroid function studies: Secondary | ICD-10-CM | POA: Diagnosis not present

## 2020-01-21 DIAGNOSIS — F329 Major depressive disorder, single episode, unspecified: Secondary | ICD-10-CM | POA: Diagnosis not present

## 2020-01-21 DIAGNOSIS — R0609 Other forms of dyspnea: Secondary | ICD-10-CM | POA: Diagnosis not present

## 2020-01-21 DIAGNOSIS — E232 Diabetes insipidus: Secondary | ICD-10-CM | POA: Diagnosis not present

## 2020-01-21 DIAGNOSIS — N1831 Chronic kidney disease, stage 3a: Secondary | ICD-10-CM | POA: Diagnosis not present

## 2020-01-21 DIAGNOSIS — N611 Abscess of the breast and nipple: Secondary | ICD-10-CM | POA: Diagnosis not present

## 2020-01-21 DIAGNOSIS — D235 Other benign neoplasm of skin of trunk: Secondary | ICD-10-CM | POA: Diagnosis not present

## 2020-01-21 DIAGNOSIS — Z8546 Personal history of malignant neoplasm of prostate: Secondary | ICD-10-CM | POA: Diagnosis not present

## 2020-01-21 DIAGNOSIS — I10 Essential (primary) hypertension: Secondary | ICD-10-CM | POA: Diagnosis not present

## 2020-01-21 DIAGNOSIS — C50812 Malignant neoplasm of overlapping sites of left female breast: Secondary | ICD-10-CM | POA: Diagnosis not present

## 2020-01-21 DIAGNOSIS — N400 Enlarged prostate without lower urinary tract symptoms: Secondary | ICD-10-CM | POA: Diagnosis not present

## 2020-01-21 DIAGNOSIS — M899 Disorder of bone, unspecified: Secondary | ICD-10-CM | POA: Diagnosis not present

## 2020-01-21 DIAGNOSIS — Z9181 History of falling: Secondary | ICD-10-CM | POA: Diagnosis not present

## 2020-01-21 DIAGNOSIS — E785 Hyperlipidemia, unspecified: Secondary | ICD-10-CM | POA: Diagnosis not present

## 2020-01-21 DIAGNOSIS — D649 Anemia, unspecified: Secondary | ICD-10-CM | POA: Diagnosis not present

## 2020-01-21 DIAGNOSIS — H9193 Unspecified hearing loss, bilateral: Secondary | ICD-10-CM | POA: Diagnosis not present

## 2020-01-21 DIAGNOSIS — M170 Bilateral primary osteoarthritis of knee: Secondary | ICD-10-CM | POA: Diagnosis not present

## 2020-01-21 DIAGNOSIS — I4891 Unspecified atrial fibrillation: Secondary | ICD-10-CM | POA: Diagnosis not present

## 2020-01-21 DIAGNOSIS — C7951 Secondary malignant neoplasm of bone: Secondary | ICD-10-CM | POA: Diagnosis not present

## 2020-01-21 DIAGNOSIS — S2241XA Multiple fractures of ribs, right side, initial encounter for closed fracture: Secondary | ICD-10-CM | POA: Diagnosis not present

## 2020-01-21 DIAGNOSIS — Z85038 Personal history of other malignant neoplasm of large intestine: Secondary | ICD-10-CM | POA: Diagnosis not present

## 2020-01-21 DIAGNOSIS — E7800 Pure hypercholesterolemia, unspecified: Secondary | ICD-10-CM | POA: Diagnosis not present

## 2020-01-21 DIAGNOSIS — Z79890 Hormone replacement therapy (postmenopausal): Secondary | ICD-10-CM | POA: Diagnosis not present

## 2020-01-21 DIAGNOSIS — I2510 Atherosclerotic heart disease of native coronary artery without angina pectoris: Secondary | ICD-10-CM | POA: Diagnosis not present

## 2020-01-21 DIAGNOSIS — Z955 Presence of coronary angioplasty implant and graft: Secondary | ICD-10-CM | POA: Diagnosis not present

## 2020-01-21 DIAGNOSIS — D61818 Other pancytopenia: Secondary | ICD-10-CM | POA: Diagnosis not present

## 2020-01-21 DIAGNOSIS — E1169 Type 2 diabetes mellitus with other specified complication: Secondary | ICD-10-CM | POA: Diagnosis not present

## 2020-01-21 DIAGNOSIS — Z743 Need for continuous supervision: Secondary | ICD-10-CM | POA: Diagnosis not present

## 2020-01-21 DIAGNOSIS — R5383 Other fatigue: Secondary | ICD-10-CM | POA: Diagnosis not present

## 2020-01-21 DIAGNOSIS — F0390 Unspecified dementia without behavioral disturbance: Secondary | ICD-10-CM | POA: Diagnosis not present

## 2020-01-21 DIAGNOSIS — G8220 Paraplegia, unspecified: Secondary | ICD-10-CM | POA: Diagnosis not present

## 2020-01-21 DIAGNOSIS — J1282 Pneumonia due to coronavirus disease 2019: Secondary | ICD-10-CM | POA: Diagnosis not present

## 2020-01-21 DIAGNOSIS — J471 Bronchiectasis with (acute) exacerbation: Secondary | ICD-10-CM | POA: Diagnosis not present

## 2020-01-21 DIAGNOSIS — H6123 Impacted cerumen, bilateral: Secondary | ICD-10-CM | POA: Diagnosis not present

## 2020-01-21 DIAGNOSIS — Z85828 Personal history of other malignant neoplasm of skin: Secondary | ICD-10-CM | POA: Diagnosis not present

## 2020-01-21 DIAGNOSIS — Z8719 Personal history of other diseases of the digestive system: Secondary | ICD-10-CM | POA: Diagnosis not present

## 2020-01-21 DIAGNOSIS — Z5112 Encounter for antineoplastic immunotherapy: Secondary | ICD-10-CM | POA: Diagnosis not present

## 2020-01-21 DIAGNOSIS — I4821 Permanent atrial fibrillation: Secondary | ICD-10-CM | POA: Diagnosis not present

## 2020-01-21 DIAGNOSIS — S8265XA Nondisplaced fracture of lateral malleolus of left fibula, initial encounter for closed fracture: Secondary | ICD-10-CM | POA: Diagnosis not present

## 2020-01-21 DIAGNOSIS — Z87440 Personal history of urinary (tract) infections: Secondary | ICD-10-CM | POA: Diagnosis not present

## 2020-01-21 DIAGNOSIS — C3492 Malignant neoplasm of unspecified part of left bronchus or lung: Secondary | ICD-10-CM | POA: Diagnosis not present

## 2020-01-21 DIAGNOSIS — J910 Malignant pleural effusion: Secondary | ICD-10-CM | POA: Diagnosis not present

## 2020-01-21 DIAGNOSIS — I5023 Acute on chronic systolic (congestive) heart failure: Secondary | ICD-10-CM | POA: Diagnosis not present

## 2020-01-21 DIAGNOSIS — I444 Left anterior fascicular block: Secondary | ICD-10-CM | POA: Diagnosis not present

## 2020-01-21 DIAGNOSIS — M0609 Rheumatoid arthritis without rheumatoid factor, multiple sites: Secondary | ICD-10-CM | POA: Diagnosis not present

## 2020-01-21 DIAGNOSIS — H2513 Age-related nuclear cataract, bilateral: Secondary | ICD-10-CM | POA: Diagnosis not present

## 2020-01-21 DIAGNOSIS — H5203 Hypermetropia, bilateral: Secondary | ICD-10-CM | POA: Diagnosis not present

## 2020-01-21 DIAGNOSIS — R5382 Chronic fatigue, unspecified: Secondary | ICD-10-CM | POA: Diagnosis not present

## 2020-01-21 DIAGNOSIS — R2689 Other abnormalities of gait and mobility: Secondary | ICD-10-CM | POA: Diagnosis not present

## 2020-01-21 DIAGNOSIS — E1142 Type 2 diabetes mellitus with diabetic polyneuropathy: Secondary | ICD-10-CM | POA: Diagnosis not present

## 2020-01-21 DIAGNOSIS — N401 Enlarged prostate with lower urinary tract symptoms: Secondary | ICD-10-CM | POA: Diagnosis not present

## 2020-01-21 DIAGNOSIS — M150 Primary generalized (osteo)arthritis: Secondary | ICD-10-CM | POA: Diagnosis not present

## 2020-01-21 DIAGNOSIS — Z170 Estrogen receptor positive status [ER+]: Secondary | ICD-10-CM | POA: Diagnosis not present

## 2020-01-21 DIAGNOSIS — N179 Acute kidney failure, unspecified: Secondary | ICD-10-CM | POA: Diagnosis not present

## 2020-01-21 DIAGNOSIS — Z853 Personal history of malignant neoplasm of breast: Secondary | ICD-10-CM | POA: Diagnosis not present

## 2020-01-21 DIAGNOSIS — R2681 Unsteadiness on feet: Secondary | ICD-10-CM | POA: Diagnosis not present

## 2020-01-21 DIAGNOSIS — L89159 Pressure ulcer of sacral region, unspecified stage: Secondary | ICD-10-CM | POA: Diagnosis not present

## 2020-01-21 DIAGNOSIS — M2550 Pain in unspecified joint: Secondary | ICD-10-CM | POA: Diagnosis not present

## 2020-01-21 DIAGNOSIS — D508 Other iron deficiency anemias: Secondary | ICD-10-CM | POA: Diagnosis not present

## 2020-01-21 DIAGNOSIS — I119 Hypertensive heart disease without heart failure: Secondary | ICD-10-CM | POA: Diagnosis not present

## 2020-01-21 DIAGNOSIS — Z1151 Encounter for screening for human papillomavirus (HPV): Secondary | ICD-10-CM | POA: Diagnosis not present

## 2020-01-21 DIAGNOSIS — N390 Urinary tract infection, site not specified: Secondary | ICD-10-CM | POA: Diagnosis not present

## 2020-01-21 DIAGNOSIS — E781 Pure hyperglyceridemia: Secondary | ICD-10-CM | POA: Diagnosis not present

## 2020-01-21 DIAGNOSIS — I480 Paroxysmal atrial fibrillation: Secondary | ICD-10-CM | POA: Diagnosis not present

## 2020-01-21 DIAGNOSIS — Z8679 Personal history of other diseases of the circulatory system: Secondary | ICD-10-CM | POA: Diagnosis not present

## 2020-01-21 DIAGNOSIS — H52223 Regular astigmatism, bilateral: Secondary | ICD-10-CM | POA: Diagnosis not present

## 2020-01-21 DIAGNOSIS — R05 Cough: Secondary | ICD-10-CM | POA: Diagnosis not present

## 2020-01-21 DIAGNOSIS — F325 Major depressive disorder, single episode, in full remission: Secondary | ICD-10-CM | POA: Diagnosis not present

## 2020-01-21 DIAGNOSIS — N319 Neuromuscular dysfunction of bladder, unspecified: Secondary | ICD-10-CM | POA: Diagnosis not present

## 2020-01-21 DIAGNOSIS — C792 Secondary malignant neoplasm of skin: Secondary | ICD-10-CM | POA: Diagnosis not present

## 2020-01-21 DIAGNOSIS — C61 Malignant neoplasm of prostate: Secondary | ICD-10-CM | POA: Diagnosis not present

## 2020-01-21 DIAGNOSIS — Z79899 Other long term (current) drug therapy: Secondary | ICD-10-CM | POA: Diagnosis not present

## 2020-01-21 DIAGNOSIS — D6489 Other specified anemias: Secondary | ICD-10-CM | POA: Diagnosis not present

## 2020-01-21 DIAGNOSIS — I7090 Unspecified atherosclerosis: Secondary | ICD-10-CM | POA: Diagnosis not present

## 2020-01-21 DIAGNOSIS — F324 Major depressive disorder, single episode, in partial remission: Secondary | ICD-10-CM | POA: Diagnosis not present

## 2020-01-21 DIAGNOSIS — G459 Transient cerebral ischemic attack, unspecified: Secondary | ICD-10-CM | POA: Diagnosis not present

## 2020-01-21 DIAGNOSIS — I255 Ischemic cardiomyopathy: Secondary | ICD-10-CM | POA: Diagnosis not present

## 2020-01-21 DIAGNOSIS — D494 Neoplasm of unspecified behavior of bladder: Secondary | ICD-10-CM | POA: Diagnosis not present

## 2020-01-21 DIAGNOSIS — M069 Rheumatoid arthritis, unspecified: Secondary | ICD-10-CM | POA: Diagnosis not present

## 2020-01-21 DIAGNOSIS — F330 Major depressive disorder, recurrent, mild: Secondary | ICD-10-CM | POA: Diagnosis not present

## 2020-01-21 DIAGNOSIS — F339 Major depressive disorder, recurrent, unspecified: Secondary | ICD-10-CM | POA: Diagnosis not present

## 2020-01-21 DIAGNOSIS — I129 Hypertensive chronic kidney disease with stage 1 through stage 4 chronic kidney disease, or unspecified chronic kidney disease: Secondary | ICD-10-CM | POA: Diagnosis not present

## 2020-01-21 DIAGNOSIS — H409 Unspecified glaucoma: Secondary | ICD-10-CM | POA: Diagnosis not present

## 2020-01-21 DIAGNOSIS — Z794 Long term (current) use of insulin: Secondary | ICD-10-CM | POA: Diagnosis not present

## 2020-01-21 DIAGNOSIS — I700 Atherosclerosis of aorta: Secondary | ICD-10-CM | POA: Diagnosis not present

## 2020-01-21 DIAGNOSIS — R918 Other nonspecific abnormal finding of lung field: Secondary | ICD-10-CM | POA: Diagnosis not present

## 2020-01-21 DIAGNOSIS — D638 Anemia in other chronic diseases classified elsewhere: Secondary | ICD-10-CM | POA: Diagnosis not present

## 2020-01-21 DIAGNOSIS — R338 Other retention of urine: Secondary | ICD-10-CM | POA: Diagnosis not present

## 2020-01-21 DIAGNOSIS — R310 Gross hematuria: Secondary | ICD-10-CM | POA: Diagnosis not present

## 2020-01-21 DIAGNOSIS — D631 Anemia in chronic kidney disease: Secondary | ICD-10-CM | POA: Diagnosis not present

## 2020-01-21 DIAGNOSIS — R4182 Altered mental status, unspecified: Secondary | ICD-10-CM | POA: Diagnosis not present

## 2020-01-21 DIAGNOSIS — N1832 Chronic kidney disease, stage 3b: Secondary | ICD-10-CM | POA: Diagnosis not present

## 2020-01-21 DIAGNOSIS — R0602 Shortness of breath: Secondary | ICD-10-CM | POA: Diagnosis not present

## 2020-01-21 DIAGNOSIS — N3941 Urge incontinence: Secondary | ICD-10-CM | POA: Diagnosis not present

## 2020-01-21 DIAGNOSIS — Z6830 Body mass index (BMI) 30.0-30.9, adult: Secondary | ICD-10-CM | POA: Diagnosis not present

## 2020-01-21 DIAGNOSIS — I6350 Cerebral infarction due to unspecified occlusion or stenosis of unspecified cerebral artery: Secondary | ICD-10-CM | POA: Diagnosis not present

## 2020-01-21 DIAGNOSIS — M549 Dorsalgia, unspecified: Secondary | ICD-10-CM | POA: Diagnosis not present

## 2020-01-21 DIAGNOSIS — M810 Age-related osteoporosis without current pathological fracture: Secondary | ICD-10-CM | POA: Diagnosis not present

## 2020-01-21 DIAGNOSIS — R079 Chest pain, unspecified: Secondary | ICD-10-CM | POA: Diagnosis not present

## 2020-01-21 DIAGNOSIS — L89310 Pressure ulcer of right buttock, unstageable: Secondary | ICD-10-CM | POA: Diagnosis not present

## 2020-01-21 DIAGNOSIS — S81801D Unspecified open wound, right lower leg, subsequent encounter: Secondary | ICD-10-CM | POA: Diagnosis not present

## 2020-01-21 DIAGNOSIS — R278 Other lack of coordination: Secondary | ICD-10-CM | POA: Diagnosis not present

## 2020-01-21 DIAGNOSIS — R0989 Other specified symptoms and signs involving the circulatory and respiratory systems: Secondary | ICD-10-CM | POA: Diagnosis not present

## 2020-01-21 DIAGNOSIS — D62 Acute posthemorrhagic anemia: Secondary | ICD-10-CM | POA: Diagnosis not present

## 2020-01-21 DIAGNOSIS — F320 Major depressive disorder, single episode, mild: Secondary | ICD-10-CM | POA: Diagnosis not present

## 2020-01-21 DIAGNOSIS — N184 Chronic kidney disease, stage 4 (severe): Secondary | ICD-10-CM | POA: Diagnosis not present

## 2020-01-21 DIAGNOSIS — E119 Type 2 diabetes mellitus without complications: Secondary | ICD-10-CM | POA: Diagnosis not present

## 2020-01-21 DIAGNOSIS — E669 Obesity, unspecified: Secondary | ICD-10-CM | POA: Diagnosis not present

## 2020-01-21 DIAGNOSIS — I739 Peripheral vascular disease, unspecified: Secondary | ICD-10-CM | POA: Diagnosis not present

## 2020-01-21 DIAGNOSIS — I5043 Acute on chronic combined systolic (congestive) and diastolic (congestive) heart failure: Secondary | ICD-10-CM | POA: Diagnosis not present

## 2020-01-21 DIAGNOSIS — E663 Overweight: Secondary | ICD-10-CM | POA: Diagnosis not present

## 2020-01-21 DIAGNOSIS — R319 Hematuria, unspecified: Secondary | ICD-10-CM | POA: Diagnosis not present

## 2020-01-21 DIAGNOSIS — K7460 Unspecified cirrhosis of liver: Secondary | ICD-10-CM | POA: Diagnosis not present

## 2020-01-21 DIAGNOSIS — D509 Iron deficiency anemia, unspecified: Secondary | ICD-10-CM | POA: Diagnosis not present

## 2020-01-21 DIAGNOSIS — R1319 Other dysphagia: Secondary | ICD-10-CM | POA: Diagnosis not present

## 2020-01-21 DIAGNOSIS — K281 Acute gastrojejunal ulcer with perforation: Secondary | ICD-10-CM | POA: Diagnosis not present

## 2020-01-21 DIAGNOSIS — W19XXXA Unspecified fall, initial encounter: Secondary | ICD-10-CM | POA: Diagnosis not present

## 2020-01-21 DIAGNOSIS — G20 Parkinson's disease: Secondary | ICD-10-CM | POA: Diagnosis not present

## 2020-01-21 DIAGNOSIS — I959 Hypotension, unspecified: Secondary | ICD-10-CM | POA: Diagnosis not present

## 2020-01-21 DIAGNOSIS — Z7902 Long term (current) use of antithrombotics/antiplatelets: Secondary | ICD-10-CM | POA: Diagnosis not present

## 2020-01-21 DIAGNOSIS — Z96651 Presence of right artificial knee joint: Secondary | ICD-10-CM | POA: Diagnosis not present

## 2020-01-21 DIAGNOSIS — J45909 Unspecified asthma, uncomplicated: Secondary | ICD-10-CM | POA: Diagnosis not present

## 2020-01-21 DIAGNOSIS — M25562 Pain in left knee: Secondary | ICD-10-CM | POA: Diagnosis not present

## 2020-01-21 DIAGNOSIS — E11622 Type 2 diabetes mellitus with other skin ulcer: Secondary | ICD-10-CM | POA: Diagnosis not present

## 2020-01-21 DIAGNOSIS — I5032 Chronic diastolic (congestive) heart failure: Secondary | ICD-10-CM | POA: Diagnosis not present

## 2020-01-21 DIAGNOSIS — J449 Chronic obstructive pulmonary disease, unspecified: Secondary | ICD-10-CM | POA: Diagnosis not present

## 2020-01-21 DIAGNOSIS — Z01818 Encounter for other preprocedural examination: Secondary | ICD-10-CM | POA: Diagnosis not present

## 2020-01-21 DIAGNOSIS — Z87891 Personal history of nicotine dependence: Secondary | ICD-10-CM | POA: Diagnosis not present

## 2020-01-21 DIAGNOSIS — F331 Major depressive disorder, recurrent, moderate: Secondary | ICD-10-CM | POA: Diagnosis not present

## 2020-01-21 DIAGNOSIS — M791 Myalgia, unspecified site: Secondary | ICD-10-CM | POA: Diagnosis not present

## 2020-01-21 DIAGNOSIS — L905 Scar conditions and fibrosis of skin: Secondary | ICD-10-CM | POA: Diagnosis not present

## 2020-01-21 DIAGNOSIS — R339 Retention of urine, unspecified: Secondary | ICD-10-CM | POA: Diagnosis not present

## 2020-01-21 DIAGNOSIS — R0789 Other chest pain: Secondary | ICD-10-CM | POA: Diagnosis not present

## 2020-01-21 DIAGNOSIS — Z66 Do not resuscitate: Secondary | ICD-10-CM | POA: Diagnosis not present

## 2020-01-21 DIAGNOSIS — M6281 Muscle weakness (generalized): Secondary | ICD-10-CM | POA: Diagnosis not present

## 2020-01-21 DIAGNOSIS — R748 Abnormal levels of other serum enzymes: Secondary | ICD-10-CM | POA: Diagnosis not present

## 2020-01-21 DIAGNOSIS — R609 Edema, unspecified: Secondary | ICD-10-CM | POA: Diagnosis not present

## 2020-01-21 DIAGNOSIS — I083 Combined rheumatic disorders of mitral, aortic and tricuspid valves: Secondary | ICD-10-CM | POA: Diagnosis not present

## 2020-01-21 DIAGNOSIS — R52 Pain, unspecified: Secondary | ICD-10-CM | POA: Diagnosis not present

## 2020-01-21 DIAGNOSIS — R4189 Other symptoms and signs involving cognitive functions and awareness: Secondary | ICD-10-CM | POA: Diagnosis not present

## 2020-01-21 DIAGNOSIS — E1151 Type 2 diabetes mellitus with diabetic peripheral angiopathy without gangrene: Secondary | ICD-10-CM | POA: Diagnosis not present

## 2020-01-21 DIAGNOSIS — R7989 Other specified abnormal findings of blood chemistry: Secondary | ICD-10-CM | POA: Diagnosis not present

## 2020-01-21 DIAGNOSIS — J9601 Acute respiratory failure with hypoxia: Secondary | ICD-10-CM | POA: Diagnosis not present

## 2020-01-21 DIAGNOSIS — K219 Gastro-esophageal reflux disease without esophagitis: Secondary | ICD-10-CM | POA: Diagnosis not present

## 2020-01-21 DIAGNOSIS — I872 Venous insufficiency (chronic) (peripheral): Secondary | ICD-10-CM | POA: Diagnosis not present

## 2020-01-21 DIAGNOSIS — G4700 Insomnia, unspecified: Secondary | ICD-10-CM | POA: Diagnosis not present

## 2020-01-21 DIAGNOSIS — E039 Hypothyroidism, unspecified: Secondary | ICD-10-CM | POA: Diagnosis not present

## 2020-01-21 DIAGNOSIS — J189 Pneumonia, unspecified organism: Secondary | ICD-10-CM | POA: Diagnosis not present

## 2020-01-21 DIAGNOSIS — C3411 Malignant neoplasm of upper lobe, right bronchus or lung: Secondary | ICD-10-CM | POA: Diagnosis not present

## 2020-01-21 DIAGNOSIS — K651 Peritoneal abscess: Secondary | ICD-10-CM | POA: Diagnosis not present

## 2020-01-21 DIAGNOSIS — D500 Iron deficiency anemia secondary to blood loss (chronic): Secondary | ICD-10-CM | POA: Diagnosis not present

## 2020-01-21 DIAGNOSIS — M179 Osteoarthritis of knee, unspecified: Secondary | ICD-10-CM | POA: Diagnosis not present

## 2020-01-21 DIAGNOSIS — I998 Other disorder of circulatory system: Secondary | ICD-10-CM | POA: Diagnosis not present

## 2020-01-21 DIAGNOSIS — F3341 Major depressive disorder, recurrent, in partial remission: Secondary | ICD-10-CM | POA: Diagnosis not present

## 2020-01-21 DIAGNOSIS — R634 Abnormal weight loss: Secondary | ICD-10-CM | POA: Diagnosis not present

## 2020-01-21 DIAGNOSIS — E782 Mixed hyperlipidemia: Secondary | ICD-10-CM | POA: Diagnosis not present

## 2020-01-21 DIAGNOSIS — C50412 Malignant neoplasm of upper-outer quadrant of left female breast: Secondary | ICD-10-CM | POA: Diagnosis not present

## 2020-01-21 DIAGNOSIS — Z951 Presence of aortocoronary bypass graft: Secondary | ICD-10-CM | POA: Diagnosis not present

## 2020-01-21 DIAGNOSIS — H524 Presbyopia: Secondary | ICD-10-CM | POA: Diagnosis not present

## 2020-01-21 DIAGNOSIS — Z510 Encounter for antineoplastic radiation therapy: Secondary | ICD-10-CM | POA: Diagnosis not present

## 2020-01-21 DIAGNOSIS — G4733 Obstructive sleep apnea (adult) (pediatric): Secondary | ICD-10-CM | POA: Diagnosis not present

## 2020-01-21 DIAGNOSIS — U071 COVID-19: Secondary | ICD-10-CM | POA: Diagnosis not present

## 2020-01-21 DIAGNOSIS — Z736 Limitation of activities due to disability: Secondary | ICD-10-CM | POA: Diagnosis not present

## 2020-01-21 DIAGNOSIS — J4520 Mild intermittent asthma, uncomplicated: Secondary | ICD-10-CM | POA: Diagnosis not present

## 2020-01-21 DIAGNOSIS — M109 Gout, unspecified: Secondary | ICD-10-CM | POA: Diagnosis not present

## 2020-01-21 DIAGNOSIS — I130 Hypertensive heart and chronic kidney disease with heart failure and stage 1 through stage 4 chronic kidney disease, or unspecified chronic kidney disease: Secondary | ICD-10-CM | POA: Diagnosis not present

## 2020-01-21 DIAGNOSIS — C642 Malignant neoplasm of left kidney, except renal pelvis: Secondary | ICD-10-CM | POA: Diagnosis not present

## 2020-01-21 DIAGNOSIS — F488 Other specified nonpsychotic mental disorders: Secondary | ICD-10-CM | POA: Diagnosis not present

## 2020-01-21 DIAGNOSIS — Z6825 Body mass index (BMI) 25.0-25.9, adult: Secondary | ICD-10-CM | POA: Diagnosis not present

## 2020-01-21 DIAGNOSIS — N183 Chronic kidney disease, stage 3 unspecified: Secondary | ICD-10-CM | POA: Diagnosis not present

## 2020-01-21 DIAGNOSIS — I441 Atrioventricular block, second degree: Secondary | ICD-10-CM | POA: Diagnosis not present

## 2020-01-21 DIAGNOSIS — I340 Nonrheumatic mitral (valve) insufficiency: Secondary | ICD-10-CM | POA: Diagnosis not present

## 2020-01-21 DIAGNOSIS — Z95810 Presence of automatic (implantable) cardiac defibrillator: Secondary | ICD-10-CM | POA: Diagnosis not present

## 2020-01-21 DIAGNOSIS — M84454A Pathological fracture, pelvis, initial encounter for fracture: Secondary | ICD-10-CM | POA: Diagnosis not present

## 2020-01-21 DIAGNOSIS — M1990 Unspecified osteoarthritis, unspecified site: Secondary | ICD-10-CM | POA: Diagnosis not present

## 2020-01-21 DIAGNOSIS — Z8249 Family history of ischemic heart disease and other diseases of the circulatory system: Secondary | ICD-10-CM | POA: Diagnosis not present

## 2020-01-21 DIAGNOSIS — Z8616 Personal history of COVID-19: Secondary | ICD-10-CM | POA: Diagnosis not present

## 2020-01-21 DIAGNOSIS — C50419 Malignant neoplasm of upper-outer quadrant of unspecified female breast: Secondary | ICD-10-CM | POA: Diagnosis not present

## 2020-01-21 DIAGNOSIS — I252 Old myocardial infarction: Secondary | ICD-10-CM | POA: Diagnosis not present

## 2020-01-21 DIAGNOSIS — J439 Emphysema, unspecified: Secondary | ICD-10-CM | POA: Diagnosis not present

## 2020-01-21 DIAGNOSIS — I200 Unstable angina: Secondary | ICD-10-CM | POA: Diagnosis not present

## 2020-01-21 DIAGNOSIS — Z09 Encounter for follow-up examination after completed treatment for conditions other than malignant neoplasm: Secondary | ICD-10-CM | POA: Diagnosis not present

## 2020-01-21 DIAGNOSIS — R0902 Hypoxemia: Secondary | ICD-10-CM | POA: Diagnosis not present

## 2020-01-21 DIAGNOSIS — Z20822 Contact with and (suspected) exposure to covid-19: Secondary | ICD-10-CM | POA: Diagnosis not present

## 2020-01-21 DIAGNOSIS — M1711 Unilateral primary osteoarthritis, right knee: Secondary | ICD-10-CM | POA: Diagnosis not present

## 2020-01-22 DIAGNOSIS — G459 Transient cerebral ischemic attack, unspecified: Secondary | ICD-10-CM | POA: Diagnosis not present

## 2020-01-22 DIAGNOSIS — F424 Excoriation (skin-picking) disorder: Secondary | ICD-10-CM | POA: Diagnosis not present

## 2020-01-22 DIAGNOSIS — R1013 Epigastric pain: Secondary | ICD-10-CM | POA: Diagnosis not present

## 2020-01-22 DIAGNOSIS — N390 Urinary tract infection, site not specified: Secondary | ICD-10-CM | POA: Diagnosis not present

## 2020-01-22 DIAGNOSIS — R457 State of emotional shock and stress, unspecified: Secondary | ICD-10-CM | POA: Diagnosis not present

## 2020-01-22 DIAGNOSIS — I444 Left anterior fascicular block: Secondary | ICD-10-CM | POA: Diagnosis not present

## 2020-01-22 DIAGNOSIS — R279 Unspecified lack of coordination: Secondary | ICD-10-CM | POA: Diagnosis not present

## 2020-01-22 DIAGNOSIS — F319 Bipolar disorder, unspecified: Secondary | ICD-10-CM | POA: Diagnosis not present

## 2020-01-22 DIAGNOSIS — Z20828 Contact with and (suspected) exposure to other viral communicable diseases: Secondary | ICD-10-CM | POA: Diagnosis not present

## 2020-01-22 DIAGNOSIS — R10817 Generalized abdominal tenderness: Secondary | ICD-10-CM | POA: Diagnosis not present

## 2020-01-22 DIAGNOSIS — R69 Illness, unspecified: Secondary | ICD-10-CM | POA: Diagnosis not present

## 2020-01-22 DIAGNOSIS — R000 Tachycardia, unspecified: Secondary | ICD-10-CM | POA: Diagnosis not present

## 2020-01-22 DIAGNOSIS — R52 Pain, unspecified: Secondary | ICD-10-CM | POA: Diagnosis not present

## 2020-01-22 DIAGNOSIS — Z743 Need for continuous supervision: Secondary | ICD-10-CM | POA: Diagnosis not present

## 2020-01-22 DIAGNOSIS — R0602 Shortness of breath: Secondary | ICD-10-CM | POA: Diagnosis not present

## 2020-01-22 DIAGNOSIS — Z20822 Contact with and (suspected) exposure to covid-19: Secondary | ICD-10-CM | POA: Diagnosis not present

## 2020-01-22 DIAGNOSIS — N201 Calculus of ureter: Secondary | ICD-10-CM | POA: Diagnosis not present

## 2020-01-22 DIAGNOSIS — R001 Bradycardia, unspecified: Secondary | ICD-10-CM | POA: Diagnosis not present

## 2020-01-22 DIAGNOSIS — R0789 Other chest pain: Secondary | ICD-10-CM | POA: Diagnosis not present

## 2020-01-22 DIAGNOSIS — U071 COVID-19: Secondary | ICD-10-CM | POA: Diagnosis not present

## 2020-01-22 DIAGNOSIS — I10 Essential (primary) hypertension: Secondary | ICD-10-CM | POA: Diagnosis not present

## 2020-01-22 DIAGNOSIS — D649 Anemia, unspecified: Secondary | ICD-10-CM | POA: Diagnosis not present

## 2020-01-22 DIAGNOSIS — R55 Syncope and collapse: Secondary | ICD-10-CM | POA: Diagnosis not present

## 2020-01-22 DIAGNOSIS — M25561 Pain in right knee: Secondary | ICD-10-CM | POA: Diagnosis not present

## 2020-01-23 DIAGNOSIS — B955 Unspecified streptococcus as the cause of diseases classified elsewhere: Secondary | ICD-10-CM | POA: Diagnosis not present

## 2020-01-23 DIAGNOSIS — G8929 Other chronic pain: Secondary | ICD-10-CM | POA: Diagnosis not present

## 2020-01-23 DIAGNOSIS — L0201 Cutaneous abscess of face: Secondary | ICD-10-CM | POA: Diagnosis not present

## 2020-01-23 DIAGNOSIS — R7881 Bacteremia: Secondary | ICD-10-CM | POA: Diagnosis not present

## 2020-01-23 DIAGNOSIS — E559 Vitamin D deficiency, unspecified: Secondary | ICD-10-CM | POA: Diagnosis not present

## 2020-01-23 DIAGNOSIS — B9562 Methicillin resistant Staphylococcus aureus infection as the cause of diseases classified elsewhere: Secondary | ICD-10-CM | POA: Diagnosis not present

## 2020-01-23 DIAGNOSIS — J449 Chronic obstructive pulmonary disease, unspecified: Secondary | ICD-10-CM | POA: Diagnosis not present

## 2020-01-23 DIAGNOSIS — F0390 Unspecified dementia without behavioral disturbance: Secondary | ICD-10-CM | POA: Diagnosis not present

## 2020-01-23 DIAGNOSIS — A4902 Methicillin resistant Staphylococcus aureus infection, unspecified site: Secondary | ICD-10-CM | POA: Diagnosis not present

## 2020-01-23 DIAGNOSIS — I129 Hypertensive chronic kidney disease with stage 1 through stage 4 chronic kidney disease, or unspecified chronic kidney disease: Secondary | ICD-10-CM | POA: Diagnosis not present

## 2020-01-23 DIAGNOSIS — N390 Urinary tract infection, site not specified: Secondary | ICD-10-CM | POA: Diagnosis not present

## 2020-01-23 DIAGNOSIS — K122 Cellulitis and abscess of mouth: Secondary | ICD-10-CM | POA: Diagnosis not present

## 2020-01-23 DIAGNOSIS — Z7982 Long term (current) use of aspirin: Secondary | ICD-10-CM | POA: Diagnosis not present

## 2020-01-23 DIAGNOSIS — M47816 Spondylosis without myelopathy or radiculopathy, lumbar region: Secondary | ICD-10-CM | POA: Diagnosis not present

## 2020-01-23 DIAGNOSIS — E782 Mixed hyperlipidemia: Secondary | ICD-10-CM | POA: Diagnosis not present

## 2020-01-23 DIAGNOSIS — M62551 Muscle wasting and atrophy, not elsewhere classified, right thigh: Secondary | ICD-10-CM | POA: Diagnosis not present

## 2020-01-23 DIAGNOSIS — G629 Polyneuropathy, unspecified: Secondary | ICD-10-CM | POA: Diagnosis not present

## 2020-01-23 DIAGNOSIS — M1990 Unspecified osteoarthritis, unspecified site: Secondary | ICD-10-CM | POA: Diagnosis not present

## 2020-01-23 DIAGNOSIS — E441 Mild protein-calorie malnutrition: Secondary | ICD-10-CM | POA: Diagnosis not present

## 2020-01-23 DIAGNOSIS — E785 Hyperlipidemia, unspecified: Secondary | ICD-10-CM | POA: Diagnosis not present

## 2020-01-23 DIAGNOSIS — W19XXXA Unspecified fall, initial encounter: Secondary | ICD-10-CM | POA: Diagnosis not present

## 2020-01-23 DIAGNOSIS — I10 Essential (primary) hypertension: Secondary | ICD-10-CM | POA: Diagnosis not present

## 2020-01-23 DIAGNOSIS — G4733 Obstructive sleep apnea (adult) (pediatric): Secondary | ICD-10-CM | POA: Diagnosis not present

## 2020-01-23 DIAGNOSIS — Z8616 Personal history of COVID-19: Secondary | ICD-10-CM | POA: Diagnosis not present

## 2020-01-23 DIAGNOSIS — L270 Generalized skin eruption due to drugs and medicaments taken internally: Secondary | ICD-10-CM | POA: Diagnosis not present

## 2020-01-23 DIAGNOSIS — R0789 Other chest pain: Secondary | ICD-10-CM | POA: Diagnosis not present

## 2020-01-23 DIAGNOSIS — F339 Major depressive disorder, recurrent, unspecified: Secondary | ICD-10-CM | POA: Diagnosis not present

## 2020-01-23 DIAGNOSIS — E119 Type 2 diabetes mellitus without complications: Secondary | ICD-10-CM | POA: Diagnosis not present

## 2020-01-23 DIAGNOSIS — R410 Disorientation, unspecified: Secondary | ICD-10-CM | POA: Diagnosis not present

## 2020-01-23 DIAGNOSIS — E1165 Type 2 diabetes mellitus with hyperglycemia: Secondary | ICD-10-CM | POA: Diagnosis not present

## 2020-01-23 DIAGNOSIS — E8809 Other disorders of plasma-protein metabolism, not elsewhere classified: Secondary | ICD-10-CM | POA: Diagnosis not present

## 2020-01-23 DIAGNOSIS — T68XXXA Hypothermia, initial encounter: Secondary | ICD-10-CM | POA: Diagnosis not present

## 2020-01-23 DIAGNOSIS — N189 Chronic kidney disease, unspecified: Secondary | ICD-10-CM | POA: Diagnosis not present

## 2020-01-23 DIAGNOSIS — K219 Gastro-esophageal reflux disease without esophagitis: Secondary | ICD-10-CM | POA: Diagnosis not present

## 2020-01-23 DIAGNOSIS — M1611 Unilateral primary osteoarthritis, right hip: Secondary | ICD-10-CM | POA: Diagnosis not present

## 2020-01-23 DIAGNOSIS — D509 Iron deficiency anemia, unspecified: Secondary | ICD-10-CM | POA: Diagnosis not present

## 2020-01-23 DIAGNOSIS — E876 Hypokalemia: Secondary | ICD-10-CM | POA: Diagnosis not present

## 2020-01-23 DIAGNOSIS — R42 Dizziness and giddiness: Secondary | ICD-10-CM | POA: Diagnosis not present

## 2020-01-23 DIAGNOSIS — F411 Generalized anxiety disorder: Secondary | ICD-10-CM | POA: Diagnosis not present

## 2020-01-23 DIAGNOSIS — Z8673 Personal history of transient ischemic attack (TIA), and cerebral infarction without residual deficits: Secondary | ICD-10-CM | POA: Diagnosis not present

## 2020-01-23 DIAGNOSIS — F1120 Opioid dependence, uncomplicated: Secondary | ICD-10-CM | POA: Diagnosis not present

## 2020-01-23 DIAGNOSIS — Z9181 History of falling: Secondary | ICD-10-CM | POA: Diagnosis not present

## 2020-01-23 DIAGNOSIS — Z466 Encounter for fitting and adjustment of urinary device: Secondary | ICD-10-CM | POA: Diagnosis not present

## 2020-01-23 DIAGNOSIS — M4854XD Collapsed vertebra, not elsewhere classified, thoracic region, subsequent encounter for fracture with routine healing: Secondary | ICD-10-CM | POA: Diagnosis not present

## 2020-01-23 DIAGNOSIS — M549 Dorsalgia, unspecified: Secondary | ICD-10-CM | POA: Diagnosis not present

## 2020-01-23 DIAGNOSIS — Z7901 Long term (current) use of anticoagulants: Secondary | ICD-10-CM | POA: Diagnosis not present

## 2020-01-23 DIAGNOSIS — M4856XD Collapsed vertebra, not elsewhere classified, lumbar region, subsequent encounter for fracture with routine healing: Secondary | ICD-10-CM | POA: Diagnosis not present

## 2020-01-23 DIAGNOSIS — Z452 Encounter for adjustment and management of vascular access device: Secondary | ICD-10-CM | POA: Diagnosis not present

## 2020-01-23 DIAGNOSIS — I2510 Atherosclerotic heart disease of native coronary artery without angina pectoris: Secondary | ICD-10-CM | POA: Diagnosis not present

## 2020-01-23 DIAGNOSIS — D638 Anemia in other chronic diseases classified elsewhere: Secondary | ICD-10-CM | POA: Diagnosis not present

## 2020-01-23 DIAGNOSIS — M62552 Muscle wasting and atrophy, not elsewhere classified, left thigh: Secondary | ICD-10-CM | POA: Diagnosis not present

## 2020-01-23 DIAGNOSIS — M4626 Osteomyelitis of vertebra, lumbar region: Secondary | ICD-10-CM | POA: Diagnosis not present

## 2020-01-23 DIAGNOSIS — G2581 Restless legs syndrome: Secondary | ICD-10-CM | POA: Diagnosis not present

## 2020-01-24 DIAGNOSIS — M1990 Unspecified osteoarthritis, unspecified site: Secondary | ICD-10-CM | POA: Diagnosis not present

## 2020-01-24 DIAGNOSIS — R279 Unspecified lack of coordination: Secondary | ICD-10-CM | POA: Diagnosis not present

## 2020-01-24 DIAGNOSIS — Z955 Presence of coronary angioplasty implant and graft: Secondary | ICD-10-CM | POA: Diagnosis not present

## 2020-01-24 DIAGNOSIS — M810 Age-related osteoporosis without current pathological fracture: Secondary | ICD-10-CM | POA: Diagnosis not present

## 2020-01-24 DIAGNOSIS — I482 Chronic atrial fibrillation, unspecified: Secondary | ICD-10-CM | POA: Diagnosis not present

## 2020-01-24 DIAGNOSIS — M170 Bilateral primary osteoarthritis of knee: Secondary | ICD-10-CM | POA: Diagnosis not present

## 2020-01-24 DIAGNOSIS — E039 Hypothyroidism, unspecified: Secondary | ICD-10-CM | POA: Diagnosis not present

## 2020-01-24 DIAGNOSIS — G35 Multiple sclerosis: Secondary | ICD-10-CM | POA: Diagnosis not present

## 2020-01-24 DIAGNOSIS — I2721 Secondary pulmonary arterial hypertension: Secondary | ICD-10-CM | POA: Diagnosis not present

## 2020-01-24 DIAGNOSIS — M189 Osteoarthritis of first carpometacarpal joint, unspecified: Secondary | ICD-10-CM | POA: Diagnosis not present

## 2020-01-24 DIAGNOSIS — N400 Enlarged prostate without lower urinary tract symptoms: Secondary | ICD-10-CM | POA: Diagnosis not present

## 2020-01-24 DIAGNOSIS — Z79899 Other long term (current) drug therapy: Secondary | ICD-10-CM | POA: Diagnosis not present

## 2020-01-24 DIAGNOSIS — R4182 Altered mental status, unspecified: Secondary | ICD-10-CM | POA: Diagnosis not present

## 2020-01-24 DIAGNOSIS — Z833 Family history of diabetes mellitus: Secondary | ICD-10-CM | POA: Diagnosis not present

## 2020-01-24 DIAGNOSIS — I509 Heart failure, unspecified: Secondary | ICD-10-CM | POA: Diagnosis not present

## 2020-01-24 DIAGNOSIS — M79675 Pain in left toe(s): Secondary | ICD-10-CM | POA: Diagnosis not present

## 2020-01-24 DIAGNOSIS — Z7409 Other reduced mobility: Secondary | ICD-10-CM | POA: Diagnosis not present

## 2020-01-24 DIAGNOSIS — F1020 Alcohol dependence, uncomplicated: Secondary | ICD-10-CM | POA: Diagnosis not present

## 2020-01-24 DIAGNOSIS — T451X5A Adverse effect of antineoplastic and immunosuppressive drugs, initial encounter: Secondary | ICD-10-CM | POA: Diagnosis not present

## 2020-01-24 DIAGNOSIS — I425 Other restrictive cardiomyopathy: Secondary | ICD-10-CM | POA: Diagnosis not present

## 2020-01-24 DIAGNOSIS — E871 Hypo-osmolality and hyponatremia: Secondary | ICD-10-CM | POA: Diagnosis not present

## 2020-01-24 DIAGNOSIS — E1142 Type 2 diabetes mellitus with diabetic polyneuropathy: Secondary | ICD-10-CM | POA: Diagnosis not present

## 2020-01-24 DIAGNOSIS — F4323 Adjustment disorder with mixed anxiety and depressed mood: Secondary | ICD-10-CM | POA: Diagnosis not present

## 2020-01-24 DIAGNOSIS — N189 Chronic kidney disease, unspecified: Secondary | ICD-10-CM | POA: Diagnosis not present

## 2020-01-24 DIAGNOSIS — M79641 Pain in right hand: Secondary | ICD-10-CM | POA: Diagnosis not present

## 2020-01-24 DIAGNOSIS — S43491D Other sprain of right shoulder joint, subsequent encounter: Secondary | ICD-10-CM | POA: Diagnosis not present

## 2020-01-24 DIAGNOSIS — R918 Other nonspecific abnormal finding of lung field: Secondary | ICD-10-CM | POA: Diagnosis not present

## 2020-01-24 DIAGNOSIS — Z8546 Personal history of malignant neoplasm of prostate: Secondary | ICD-10-CM | POA: Diagnosis not present

## 2020-01-24 DIAGNOSIS — Z23 Encounter for immunization: Secondary | ICD-10-CM | POA: Diagnosis not present

## 2020-01-24 DIAGNOSIS — Z207 Contact with and (suspected) exposure to pediculosis, acariasis and other infestations: Secondary | ICD-10-CM | POA: Diagnosis not present

## 2020-01-24 DIAGNOSIS — G4700 Insomnia, unspecified: Secondary | ICD-10-CM | POA: Diagnosis not present

## 2020-01-24 DIAGNOSIS — R262 Difficulty in walking, not elsewhere classified: Secondary | ICD-10-CM | POA: Diagnosis not present

## 2020-01-24 DIAGNOSIS — L539 Erythematous condition, unspecified: Secondary | ICD-10-CM | POA: Diagnosis not present

## 2020-01-24 DIAGNOSIS — G20 Parkinson's disease: Secondary | ICD-10-CM | POA: Diagnosis not present

## 2020-01-24 DIAGNOSIS — I110 Hypertensive heart disease with heart failure: Secondary | ICD-10-CM | POA: Diagnosis not present

## 2020-01-24 DIAGNOSIS — M5388 Other specified dorsopathies, sacral and sacrococcygeal region: Secondary | ICD-10-CM | POA: Diagnosis not present

## 2020-01-24 DIAGNOSIS — Z1211 Encounter for screening for malignant neoplasm of colon: Secondary | ICD-10-CM | POA: Diagnosis not present

## 2020-01-24 DIAGNOSIS — M4723 Other spondylosis with radiculopathy, cervicothoracic region: Secondary | ICD-10-CM | POA: Diagnosis not present

## 2020-01-24 DIAGNOSIS — F0390 Unspecified dementia without behavioral disturbance: Secondary | ICD-10-CM | POA: Diagnosis not present

## 2020-01-24 DIAGNOSIS — I2510 Atherosclerotic heart disease of native coronary artery without angina pectoris: Secondary | ICD-10-CM | POA: Diagnosis not present

## 2020-01-24 DIAGNOSIS — M25542 Pain in joints of left hand: Secondary | ICD-10-CM | POA: Diagnosis not present

## 2020-01-24 DIAGNOSIS — G309 Alzheimer's disease, unspecified: Secondary | ICD-10-CM | POA: Diagnosis not present

## 2020-01-24 DIAGNOSIS — Z1159 Encounter for screening for other viral diseases: Secondary | ICD-10-CM | POA: Diagnosis not present

## 2020-01-24 DIAGNOSIS — R410 Disorientation, unspecified: Secondary | ICD-10-CM | POA: Diagnosis not present

## 2020-01-24 DIAGNOSIS — I119 Hypertensive heart disease without heart failure: Secondary | ICD-10-CM | POA: Diagnosis not present

## 2020-01-24 DIAGNOSIS — I701 Atherosclerosis of renal artery: Secondary | ICD-10-CM | POA: Diagnosis not present

## 2020-01-24 DIAGNOSIS — I2699 Other pulmonary embolism without acute cor pulmonale: Secondary | ICD-10-CM | POA: Diagnosis not present

## 2020-01-24 DIAGNOSIS — J189 Pneumonia, unspecified organism: Secondary | ICD-10-CM | POA: Diagnosis not present

## 2020-01-24 DIAGNOSIS — C210 Malignant neoplasm of anus, unspecified: Secondary | ICD-10-CM | POA: Diagnosis not present

## 2020-01-24 DIAGNOSIS — Z006 Encounter for examination for normal comparison and control in clinical research program: Secondary | ICD-10-CM | POA: Diagnosis not present

## 2020-01-24 DIAGNOSIS — E7800 Pure hypercholesterolemia, unspecified: Secondary | ICD-10-CM | POA: Diagnosis not present

## 2020-01-24 DIAGNOSIS — J470 Bronchiectasis with acute lower respiratory infection: Secondary | ICD-10-CM | POA: Diagnosis not present

## 2020-01-24 DIAGNOSIS — M2550 Pain in unspecified joint: Secondary | ICD-10-CM | POA: Diagnosis not present

## 2020-01-24 DIAGNOSIS — I499 Cardiac arrhythmia, unspecified: Secondary | ICD-10-CM | POA: Diagnosis not present

## 2020-01-24 DIAGNOSIS — C50211 Malignant neoplasm of upper-inner quadrant of right female breast: Secondary | ICD-10-CM | POA: Diagnosis not present

## 2020-01-24 DIAGNOSIS — E538 Deficiency of other specified B group vitamins: Secondary | ICD-10-CM | POA: Diagnosis not present

## 2020-01-24 DIAGNOSIS — J40 Bronchitis, not specified as acute or chronic: Secondary | ICD-10-CM | POA: Diagnosis not present

## 2020-01-24 DIAGNOSIS — Z794 Long term (current) use of insulin: Secondary | ICD-10-CM | POA: Diagnosis not present

## 2020-01-24 DIAGNOSIS — E119 Type 2 diabetes mellitus without complications: Secondary | ICD-10-CM | POA: Diagnosis not present

## 2020-01-24 DIAGNOSIS — R350 Frequency of micturition: Secondary | ICD-10-CM | POA: Diagnosis not present

## 2020-01-24 DIAGNOSIS — E1151 Type 2 diabetes mellitus with diabetic peripheral angiopathy without gangrene: Secondary | ICD-10-CM | POA: Diagnosis not present

## 2020-01-24 DIAGNOSIS — R42 Dizziness and giddiness: Secondary | ICD-10-CM | POA: Diagnosis not present

## 2020-01-24 DIAGNOSIS — I2111 ST elevation (STEMI) myocardial infarction involving right coronary artery: Secondary | ICD-10-CM | POA: Diagnosis not present

## 2020-01-24 DIAGNOSIS — F331 Major depressive disorder, recurrent, moderate: Secondary | ICD-10-CM | POA: Diagnosis not present

## 2020-01-24 DIAGNOSIS — I890 Lymphedema, not elsewhere classified: Secondary | ICD-10-CM | POA: Diagnosis not present

## 2020-01-24 DIAGNOSIS — R579 Shock, unspecified: Secondary | ICD-10-CM | POA: Diagnosis not present

## 2020-01-24 DIAGNOSIS — R600 Localized edema: Secondary | ICD-10-CM | POA: Diagnosis not present

## 2020-01-24 DIAGNOSIS — R338 Other retention of urine: Secondary | ICD-10-CM | POA: Diagnosis not present

## 2020-01-24 DIAGNOSIS — G92 Toxic encephalopathy: Secondary | ICD-10-CM | POA: Diagnosis not present

## 2020-01-24 DIAGNOSIS — S2242XD Multiple fractures of ribs, left side, subsequent encounter for fracture with routine healing: Secondary | ICD-10-CM | POA: Diagnosis not present

## 2020-01-24 DIAGNOSIS — I129 Hypertensive chronic kidney disease with stage 1 through stage 4 chronic kidney disease, or unspecified chronic kidney disease: Secondary | ICD-10-CM | POA: Diagnosis not present

## 2020-01-24 DIAGNOSIS — R001 Bradycardia, unspecified: Secondary | ICD-10-CM | POA: Diagnosis not present

## 2020-01-24 DIAGNOSIS — M9904 Segmental and somatic dysfunction of sacral region: Secondary | ICD-10-CM | POA: Diagnosis not present

## 2020-01-24 DIAGNOSIS — N390 Urinary tract infection, site not specified: Secondary | ICD-10-CM | POA: Diagnosis not present

## 2020-01-24 DIAGNOSIS — C679 Malignant neoplasm of bladder, unspecified: Secondary | ICD-10-CM | POA: Diagnosis not present

## 2020-01-24 DIAGNOSIS — M79674 Pain in right toe(s): Secondary | ICD-10-CM | POA: Diagnosis not present

## 2020-01-24 DIAGNOSIS — H409 Unspecified glaucoma: Secondary | ICD-10-CM | POA: Diagnosis not present

## 2020-01-24 DIAGNOSIS — Z85820 Personal history of malignant melanoma of skin: Secondary | ICD-10-CM | POA: Diagnosis not present

## 2020-01-24 DIAGNOSIS — I252 Old myocardial infarction: Secondary | ICD-10-CM | POA: Diagnosis not present

## 2020-01-24 DIAGNOSIS — M79676 Pain in unspecified toe(s): Secondary | ICD-10-CM | POA: Diagnosis not present

## 2020-01-24 DIAGNOSIS — R1310 Dysphagia, unspecified: Secondary | ICD-10-CM | POA: Diagnosis not present

## 2020-01-24 DIAGNOSIS — E440 Moderate protein-calorie malnutrition: Secondary | ICD-10-CM | POA: Diagnosis not present

## 2020-01-24 DIAGNOSIS — L570 Actinic keratosis: Secondary | ICD-10-CM | POA: Diagnosis not present

## 2020-01-24 DIAGNOSIS — M7732 Calcaneal spur, left foot: Secondary | ICD-10-CM | POA: Diagnosis not present

## 2020-01-24 DIAGNOSIS — D649 Anemia, unspecified: Secondary | ICD-10-CM | POA: Diagnosis not present

## 2020-01-24 DIAGNOSIS — K5720 Diverticulitis of large intestine with perforation and abscess without bleeding: Secondary | ICD-10-CM | POA: Diagnosis not present

## 2020-01-24 DIAGNOSIS — N1832 Chronic kidney disease, stage 3b: Secondary | ICD-10-CM | POA: Diagnosis not present

## 2020-01-24 DIAGNOSIS — M7731 Calcaneal spur, right foot: Secondary | ICD-10-CM | POA: Diagnosis not present

## 2020-01-24 DIAGNOSIS — I10 Essential (primary) hypertension: Secondary | ICD-10-CM | POA: Diagnosis not present

## 2020-01-24 DIAGNOSIS — E213 Hyperparathyroidism, unspecified: Secondary | ICD-10-CM | POA: Diagnosis not present

## 2020-01-24 DIAGNOSIS — M6281 Muscle weakness (generalized): Secondary | ICD-10-CM | POA: Diagnosis not present

## 2020-01-24 DIAGNOSIS — Z7984 Long term (current) use of oral hypoglycemic drugs: Secondary | ICD-10-CM | POA: Diagnosis not present

## 2020-01-24 DIAGNOSIS — K651 Peritoneal abscess: Secondary | ICD-10-CM | POA: Diagnosis not present

## 2020-01-24 DIAGNOSIS — M7121 Synovial cyst of popliteal space [Baker], right knee: Secondary | ICD-10-CM | POA: Diagnosis not present

## 2020-01-24 DIAGNOSIS — S72142A Displaced intertrochanteric fracture of left femur, initial encounter for closed fracture: Secondary | ICD-10-CM | POA: Diagnosis not present

## 2020-01-24 DIAGNOSIS — E0869 Diabetes mellitus due to underlying condition with other specified complication: Secondary | ICD-10-CM | POA: Diagnosis not present

## 2020-01-24 DIAGNOSIS — I714 Abdominal aortic aneurysm, without rupture: Secondary | ICD-10-CM | POA: Diagnosis not present

## 2020-01-24 DIAGNOSIS — Z4789 Encounter for other orthopedic aftercare: Secondary | ICD-10-CM | POA: Diagnosis not present

## 2020-01-24 DIAGNOSIS — C182 Malignant neoplasm of ascending colon: Secondary | ICD-10-CM | POA: Diagnosis not present

## 2020-01-24 DIAGNOSIS — Z1231 Encounter for screening mammogram for malignant neoplasm of breast: Secondary | ICD-10-CM | POA: Diagnosis not present

## 2020-01-24 DIAGNOSIS — E782 Mixed hyperlipidemia: Secondary | ICD-10-CM | POA: Diagnosis not present

## 2020-01-24 DIAGNOSIS — E1165 Type 2 diabetes mellitus with hyperglycemia: Secondary | ICD-10-CM | POA: Diagnosis not present

## 2020-01-24 DIAGNOSIS — Z743 Need for continuous supervision: Secondary | ICD-10-CM | POA: Diagnosis not present

## 2020-01-24 DIAGNOSIS — E785 Hyperlipidemia, unspecified: Secondary | ICD-10-CM | POA: Diagnosis not present

## 2020-01-24 DIAGNOSIS — M4722 Other spondylosis with radiculopathy, cervical region: Secondary | ICD-10-CM | POA: Diagnosis not present

## 2020-01-24 DIAGNOSIS — M159 Polyosteoarthritis, unspecified: Secondary | ICD-10-CM | POA: Diagnosis not present

## 2020-01-24 DIAGNOSIS — M9901 Segmental and somatic dysfunction of cervical region: Secondary | ICD-10-CM | POA: Diagnosis not present

## 2020-01-24 DIAGNOSIS — Z79891 Long term (current) use of opiate analgesic: Secondary | ICD-10-CM | POA: Diagnosis not present

## 2020-01-24 DIAGNOSIS — F3341 Major depressive disorder, recurrent, in partial remission: Secondary | ICD-10-CM | POA: Diagnosis not present

## 2020-01-24 DIAGNOSIS — Z48812 Encounter for surgical aftercare following surgery on the circulatory system: Secondary | ICD-10-CM | POA: Diagnosis not present

## 2020-01-24 DIAGNOSIS — Z7902 Long term (current) use of antithrombotics/antiplatelets: Secondary | ICD-10-CM | POA: Diagnosis not present

## 2020-01-24 DIAGNOSIS — J45909 Unspecified asthma, uncomplicated: Secondary | ICD-10-CM | POA: Diagnosis not present

## 2020-01-24 DIAGNOSIS — S83281A Other tear of lateral meniscus, current injury, right knee, initial encounter: Secondary | ICD-10-CM | POA: Diagnosis not present

## 2020-01-24 DIAGNOSIS — F4321 Adjustment disorder with depressed mood: Secondary | ICD-10-CM | POA: Diagnosis not present

## 2020-01-24 DIAGNOSIS — Z0000 Encounter for general adult medical examination without abnormal findings: Secondary | ICD-10-CM | POA: Diagnosis not present

## 2020-01-24 DIAGNOSIS — E611 Iron deficiency: Secondary | ICD-10-CM | POA: Diagnosis not present

## 2020-01-24 DIAGNOSIS — R748 Abnormal levels of other serum enzymes: Secondary | ICD-10-CM | POA: Diagnosis not present

## 2020-01-24 DIAGNOSIS — M25541 Pain in joints of right hand: Secondary | ICD-10-CM | POA: Diagnosis not present

## 2020-01-24 DIAGNOSIS — D500 Iron deficiency anemia secondary to blood loss (chronic): Secondary | ICD-10-CM | POA: Diagnosis not present

## 2020-01-24 DIAGNOSIS — J1282 Pneumonia due to coronavirus disease 2019: Secondary | ICD-10-CM | POA: Diagnosis not present

## 2020-01-24 DIAGNOSIS — E1122 Type 2 diabetes mellitus with diabetic chronic kidney disease: Secondary | ICD-10-CM | POA: Diagnosis not present

## 2020-01-24 DIAGNOSIS — N184 Chronic kidney disease, stage 4 (severe): Secondary | ICD-10-CM | POA: Diagnosis not present

## 2020-01-24 DIAGNOSIS — E559 Vitamin D deficiency, unspecified: Secondary | ICD-10-CM | POA: Diagnosis not present

## 2020-01-24 DIAGNOSIS — C44319 Basal cell carcinoma of skin of other parts of face: Secondary | ICD-10-CM | POA: Diagnosis not present

## 2020-01-24 DIAGNOSIS — E6609 Other obesity due to excess calories: Secondary | ICD-10-CM | POA: Diagnosis not present

## 2020-01-24 DIAGNOSIS — E890 Postprocedural hypothyroidism: Secondary | ICD-10-CM | POA: Diagnosis not present

## 2020-01-24 DIAGNOSIS — I248 Other forms of acute ischemic heart disease: Secondary | ICD-10-CM | POA: Diagnosis not present

## 2020-01-24 DIAGNOSIS — R05 Cough: Secondary | ICD-10-CM | POA: Diagnosis not present

## 2020-01-24 DIAGNOSIS — M25651 Stiffness of right hip, not elsewhere classified: Secondary | ICD-10-CM | POA: Diagnosis not present

## 2020-01-24 DIAGNOSIS — K754 Autoimmune hepatitis: Secondary | ICD-10-CM | POA: Diagnosis not present

## 2020-01-24 DIAGNOSIS — Z6837 Body mass index (BMI) 37.0-37.9, adult: Secondary | ICD-10-CM | POA: Diagnosis not present

## 2020-01-24 DIAGNOSIS — M25511 Pain in right shoulder: Secondary | ICD-10-CM | POA: Diagnosis not present

## 2020-01-24 DIAGNOSIS — Z6830 Body mass index (BMI) 30.0-30.9, adult: Secondary | ICD-10-CM | POA: Diagnosis not present

## 2020-01-24 DIAGNOSIS — I4891 Unspecified atrial fibrillation: Secondary | ICD-10-CM | POA: Diagnosis not present

## 2020-01-24 DIAGNOSIS — M546 Pain in thoracic spine: Secondary | ICD-10-CM | POA: Diagnosis not present

## 2020-01-24 DIAGNOSIS — M179 Osteoarthritis of knee, unspecified: Secondary | ICD-10-CM | POA: Diagnosis not present

## 2020-01-24 DIAGNOSIS — J80 Acute respiratory distress syndrome: Secondary | ICD-10-CM | POA: Diagnosis not present

## 2020-01-24 DIAGNOSIS — E1121 Type 2 diabetes mellitus with diabetic nephropathy: Secondary | ICD-10-CM | POA: Diagnosis not present

## 2020-01-24 DIAGNOSIS — I130 Hypertensive heart and chronic kidney disease with heart failure and stage 1 through stage 4 chronic kidney disease, or unspecified chronic kidney disease: Secondary | ICD-10-CM | POA: Diagnosis not present

## 2020-01-24 DIAGNOSIS — C946 Myelodysplastic disease, not classified: Secondary | ICD-10-CM | POA: Diagnosis not present

## 2020-01-24 DIAGNOSIS — M1712 Unilateral primary osteoarthritis, left knee: Secondary | ICD-10-CM | POA: Diagnosis not present

## 2020-01-24 DIAGNOSIS — I82443 Acute embolism and thrombosis of tibial vein, bilateral: Secondary | ICD-10-CM | POA: Diagnosis not present

## 2020-01-24 DIAGNOSIS — R911 Solitary pulmonary nodule: Secondary | ICD-10-CM | POA: Diagnosis not present

## 2020-01-24 DIAGNOSIS — Z9181 History of falling: Secondary | ICD-10-CM | POA: Diagnosis not present

## 2020-01-24 DIAGNOSIS — I493 Ventricular premature depolarization: Secondary | ICD-10-CM | POA: Diagnosis not present

## 2020-01-24 DIAGNOSIS — E2740 Unspecified adrenocortical insufficiency: Secondary | ICD-10-CM | POA: Diagnosis not present

## 2020-01-24 DIAGNOSIS — R0902 Hypoxemia: Secondary | ICD-10-CM | POA: Diagnosis not present

## 2020-01-24 DIAGNOSIS — K760 Fatty (change of) liver, not elsewhere classified: Secondary | ICD-10-CM | POA: Diagnosis not present

## 2020-01-24 DIAGNOSIS — R339 Retention of urine, unspecified: Secondary | ICD-10-CM | POA: Diagnosis not present

## 2020-01-24 DIAGNOSIS — E781 Pure hyperglyceridemia: Secondary | ICD-10-CM | POA: Diagnosis not present

## 2020-01-24 DIAGNOSIS — R2689 Other abnormalities of gait and mobility: Secondary | ICD-10-CM | POA: Diagnosis not present

## 2020-01-24 DIAGNOSIS — R5381 Other malaise: Secondary | ICD-10-CM | POA: Diagnosis not present

## 2020-01-24 DIAGNOSIS — C439 Malignant melanoma of skin, unspecified: Secondary | ICD-10-CM | POA: Diagnosis not present

## 2020-01-24 DIAGNOSIS — M25561 Pain in right knee: Secondary | ICD-10-CM | POA: Diagnosis not present

## 2020-01-24 DIAGNOSIS — E872 Acidosis: Secondary | ICD-10-CM | POA: Diagnosis not present

## 2020-01-24 DIAGNOSIS — E860 Dehydration: Secondary | ICD-10-CM | POA: Diagnosis not present

## 2020-01-24 DIAGNOSIS — R296 Repeated falls: Secondary | ICD-10-CM | POA: Diagnosis not present

## 2020-01-24 DIAGNOSIS — D696 Thrombocytopenia, unspecified: Secondary | ICD-10-CM | POA: Diagnosis not present

## 2020-01-24 DIAGNOSIS — G8929 Other chronic pain: Secondary | ICD-10-CM | POA: Diagnosis not present

## 2020-01-24 DIAGNOSIS — R55 Syncope and collapse: Secondary | ICD-10-CM | POA: Diagnosis not present

## 2020-01-24 DIAGNOSIS — Z823 Family history of stroke: Secondary | ICD-10-CM | POA: Diagnosis not present

## 2020-01-24 DIAGNOSIS — I447 Left bundle-branch block, unspecified: Secondary | ICD-10-CM | POA: Diagnosis not present

## 2020-01-24 DIAGNOSIS — D539 Nutritional anemia, unspecified: Secondary | ICD-10-CM | POA: Diagnosis not present

## 2020-01-24 DIAGNOSIS — R5383 Other fatigue: Secondary | ICD-10-CM | POA: Diagnosis not present

## 2020-01-24 DIAGNOSIS — R790 Abnormal level of blood mineral: Secondary | ICD-10-CM | POA: Diagnosis not present

## 2020-01-24 DIAGNOSIS — B351 Tinea unguium: Secondary | ICD-10-CM | POA: Diagnosis not present

## 2020-01-24 DIAGNOSIS — I4819 Other persistent atrial fibrillation: Secondary | ICD-10-CM | POA: Diagnosis not present

## 2020-01-24 DIAGNOSIS — I69151 Hemiplegia and hemiparesis following nontraumatic intracerebral hemorrhage affecting right dominant side: Secondary | ICD-10-CM | POA: Diagnosis not present

## 2020-01-24 DIAGNOSIS — R079 Chest pain, unspecified: Secondary | ICD-10-CM | POA: Diagnosis not present

## 2020-01-24 DIAGNOSIS — M8580 Other specified disorders of bone density and structure, unspecified site: Secondary | ICD-10-CM | POA: Diagnosis not present

## 2020-01-24 DIAGNOSIS — F419 Anxiety disorder, unspecified: Secondary | ICD-10-CM | POA: Diagnosis not present

## 2020-01-24 DIAGNOSIS — N1831 Chronic kidney disease, stage 3a: Secondary | ICD-10-CM | POA: Diagnosis not present

## 2020-01-24 DIAGNOSIS — Z825 Family history of asthma and other chronic lower respiratory diseases: Secondary | ICD-10-CM | POA: Diagnosis not present

## 2020-01-24 DIAGNOSIS — L820 Inflamed seborrheic keratosis: Secondary | ICD-10-CM | POA: Diagnosis not present

## 2020-01-24 DIAGNOSIS — E11319 Type 2 diabetes mellitus with unspecified diabetic retinopathy without macular edema: Secondary | ICD-10-CM | POA: Diagnosis not present

## 2020-01-24 DIAGNOSIS — I700 Atherosclerosis of aorta: Secondary | ICD-10-CM | POA: Diagnosis not present

## 2020-01-24 DIAGNOSIS — C50411 Malignant neoplasm of upper-outer quadrant of right female breast: Secondary | ICD-10-CM | POA: Diagnosis not present

## 2020-01-24 DIAGNOSIS — G620 Drug-induced polyneuropathy: Secondary | ICD-10-CM | POA: Diagnosis not present

## 2020-01-24 DIAGNOSIS — Z87440 Personal history of urinary (tract) infections: Secondary | ICD-10-CM | POA: Diagnosis not present

## 2020-01-24 DIAGNOSIS — I70213 Atherosclerosis of native arteries of extremities with intermittent claudication, bilateral legs: Secondary | ICD-10-CM | POA: Diagnosis not present

## 2020-01-24 DIAGNOSIS — D494 Neoplasm of unspecified behavior of bladder: Secondary | ICD-10-CM | POA: Diagnosis not present

## 2020-01-24 DIAGNOSIS — I4892 Unspecified atrial flutter: Secondary | ICD-10-CM | POA: Diagnosis not present

## 2020-01-24 DIAGNOSIS — H259 Unspecified age-related cataract: Secondary | ICD-10-CM | POA: Diagnosis not present

## 2020-01-24 DIAGNOSIS — R0602 Shortness of breath: Secondary | ICD-10-CM | POA: Diagnosis not present

## 2020-01-24 DIAGNOSIS — Z9049 Acquired absence of other specified parts of digestive tract: Secondary | ICD-10-CM | POA: Diagnosis not present

## 2020-01-24 DIAGNOSIS — M26602 Left temporomandibular joint disorder, unspecified: Secondary | ICD-10-CM | POA: Diagnosis not present

## 2020-01-24 DIAGNOSIS — E1169 Type 2 diabetes mellitus with other specified complication: Secondary | ICD-10-CM | POA: Diagnosis not present

## 2020-01-24 DIAGNOSIS — R7989 Other specified abnormal findings of blood chemistry: Secondary | ICD-10-CM | POA: Diagnosis not present

## 2020-01-24 DIAGNOSIS — M19041 Primary osteoarthritis, right hand: Secondary | ICD-10-CM | POA: Diagnosis not present

## 2020-01-24 DIAGNOSIS — M1711 Unilateral primary osteoarthritis, right knee: Secondary | ICD-10-CM | POA: Diagnosis not present

## 2020-01-24 DIAGNOSIS — E0821 Diabetes mellitus due to underlying condition with diabetic nephropathy: Secondary | ICD-10-CM | POA: Diagnosis not present

## 2020-01-24 DIAGNOSIS — I77811 Abdominal aortic ectasia: Secondary | ICD-10-CM | POA: Diagnosis not present

## 2020-01-24 DIAGNOSIS — I5033 Acute on chronic diastolic (congestive) heart failure: Secondary | ICD-10-CM | POA: Diagnosis not present

## 2020-01-24 DIAGNOSIS — Z9862 Peripheral vascular angioplasty status: Secondary | ICD-10-CM | POA: Diagnosis not present

## 2020-01-24 DIAGNOSIS — G8918 Other acute postprocedural pain: Secondary | ICD-10-CM | POA: Diagnosis not present

## 2020-01-24 DIAGNOSIS — J449 Chronic obstructive pulmonary disease, unspecified: Secondary | ICD-10-CM | POA: Diagnosis not present

## 2020-01-24 DIAGNOSIS — F329 Major depressive disorder, single episode, unspecified: Secondary | ICD-10-CM | POA: Diagnosis not present

## 2020-01-24 DIAGNOSIS — S83241A Other tear of medial meniscus, current injury, right knee, initial encounter: Secondary | ICD-10-CM | POA: Diagnosis not present

## 2020-01-24 DIAGNOSIS — C50412 Malignant neoplasm of upper-outer quadrant of left female breast: Secondary | ICD-10-CM | POA: Diagnosis not present

## 2020-01-24 DIAGNOSIS — I639 Cerebral infarction, unspecified: Secondary | ICD-10-CM | POA: Diagnosis not present

## 2020-01-24 DIAGNOSIS — J4530 Mild persistent asthma, uncomplicated: Secondary | ICD-10-CM | POA: Diagnosis not present

## 2020-01-24 DIAGNOSIS — Z780 Asymptomatic menopausal state: Secondary | ICD-10-CM | POA: Diagnosis not present

## 2020-01-24 DIAGNOSIS — I440 Atrioventricular block, first degree: Secondary | ICD-10-CM | POA: Diagnosis not present

## 2020-01-24 DIAGNOSIS — E569 Vitamin deficiency, unspecified: Secondary | ICD-10-CM | POA: Diagnosis not present

## 2020-01-24 DIAGNOSIS — E079 Disorder of thyroid, unspecified: Secondary | ICD-10-CM | POA: Diagnosis not present

## 2020-01-24 DIAGNOSIS — J90 Pleural effusion, not elsewhere classified: Secondary | ICD-10-CM | POA: Diagnosis not present

## 2020-01-24 DIAGNOSIS — Z974 Presence of external hearing-aid: Secondary | ICD-10-CM | POA: Diagnosis not present

## 2020-01-24 DIAGNOSIS — A419 Sepsis, unspecified organism: Secondary | ICD-10-CM | POA: Diagnosis not present

## 2020-01-24 DIAGNOSIS — G8252 Quadriplegia, C1-C4 incomplete: Secondary | ICD-10-CM | POA: Diagnosis not present

## 2020-01-24 DIAGNOSIS — Z8616 Personal history of COVID-19: Secondary | ICD-10-CM | POA: Diagnosis not present

## 2020-01-24 DIAGNOSIS — U071 COVID-19: Secondary | ICD-10-CM | POA: Diagnosis not present

## 2020-01-24 DIAGNOSIS — I2583 Coronary atherosclerosis due to lipid rich plaque: Secondary | ICD-10-CM | POA: Diagnosis not present

## 2020-01-24 DIAGNOSIS — Z86010 Personal history of colonic polyps: Secondary | ICD-10-CM | POA: Diagnosis not present

## 2020-01-24 DIAGNOSIS — S72142D Displaced intertrochanteric fracture of left femur, subsequent encounter for closed fracture with routine healing: Secondary | ICD-10-CM | POA: Diagnosis not present

## 2020-01-24 DIAGNOSIS — N183 Chronic kidney disease, stage 3 unspecified: Secondary | ICD-10-CM | POA: Diagnosis not present

## 2020-01-24 DIAGNOSIS — S72002D Fracture of unspecified part of neck of left femur, subsequent encounter for closed fracture with routine healing: Secondary | ICD-10-CM | POA: Diagnosis not present

## 2020-01-24 DIAGNOSIS — I959 Hypotension, unspecified: Secondary | ICD-10-CM | POA: Diagnosis not present

## 2020-01-24 DIAGNOSIS — R531 Weakness: Secondary | ICD-10-CM | POA: Diagnosis not present

## 2020-01-24 DIAGNOSIS — D631 Anemia in chronic kidney disease: Secondary | ICD-10-CM | POA: Diagnosis not present

## 2020-01-24 DIAGNOSIS — R54 Age-related physical debility: Secondary | ICD-10-CM | POA: Diagnosis not present

## 2020-01-24 DIAGNOSIS — M0680 Other specified rheumatoid arthritis, unspecified site: Secondary | ICD-10-CM | POA: Diagnosis not present

## 2020-01-24 DIAGNOSIS — Z87891 Personal history of nicotine dependence: Secondary | ICD-10-CM | POA: Diagnosis not present

## 2020-01-24 DIAGNOSIS — J441 Chronic obstructive pulmonary disease with (acute) exacerbation: Secondary | ICD-10-CM | POA: Diagnosis not present

## 2020-01-24 DIAGNOSIS — Z7982 Long term (current) use of aspirin: Secondary | ICD-10-CM | POA: Diagnosis not present

## 2020-01-24 DIAGNOSIS — M109 Gout, unspecified: Secondary | ICD-10-CM | POA: Diagnosis not present

## 2020-01-24 DIAGNOSIS — E669 Obesity, unspecified: Secondary | ICD-10-CM | POA: Diagnosis not present

## 2020-01-24 DIAGNOSIS — E11649 Type 2 diabetes mellitus with hypoglycemia without coma: Secondary | ICD-10-CM | POA: Diagnosis not present

## 2020-01-24 DIAGNOSIS — I872 Venous insufficiency (chronic) (peripheral): Secondary | ICD-10-CM | POA: Diagnosis not present

## 2020-01-24 DIAGNOSIS — C7889 Secondary malignant neoplasm of other digestive organs: Secondary | ICD-10-CM | POA: Diagnosis not present

## 2020-01-24 DIAGNOSIS — C61 Malignant neoplasm of prostate: Secondary | ICD-10-CM | POA: Diagnosis not present

## 2020-01-24 DIAGNOSIS — M79642 Pain in left hand: Secondary | ICD-10-CM | POA: Diagnosis not present

## 2020-01-24 DIAGNOSIS — Z86711 Personal history of pulmonary embolism: Secondary | ICD-10-CM | POA: Diagnosis not present

## 2020-01-24 DIAGNOSIS — J9601 Acute respiratory failure with hypoxia: Secondary | ICD-10-CM | POA: Diagnosis not present

## 2020-01-24 DIAGNOSIS — M47816 Spondylosis without myelopathy or radiculopathy, lumbar region: Secondary | ICD-10-CM | POA: Diagnosis not present

## 2020-01-24 DIAGNOSIS — F0280 Dementia in other diseases classified elsewhere without behavioral disturbance: Secondary | ICD-10-CM | POA: Diagnosis not present

## 2020-01-24 DIAGNOSIS — I5032 Chronic diastolic (congestive) heart failure: Secondary | ICD-10-CM | POA: Diagnosis not present

## 2020-01-24 DIAGNOSIS — M25551 Pain in right hip: Secondary | ICD-10-CM | POA: Diagnosis not present

## 2020-01-24 DIAGNOSIS — Z7401 Bed confinement status: Secondary | ICD-10-CM | POA: Diagnosis not present

## 2020-01-24 DIAGNOSIS — F3340 Major depressive disorder, recurrent, in remission, unspecified: Secondary | ICD-10-CM | POA: Diagnosis not present

## 2020-01-24 DIAGNOSIS — I429 Cardiomyopathy, unspecified: Secondary | ICD-10-CM | POA: Diagnosis not present

## 2020-01-24 DIAGNOSIS — I5021 Acute systolic (congestive) heart failure: Secondary | ICD-10-CM | POA: Diagnosis not present

## 2020-01-24 DIAGNOSIS — R7303 Prediabetes: Secondary | ICD-10-CM | POA: Diagnosis not present

## 2020-01-24 DIAGNOSIS — K5730 Diverticulosis of large intestine without perforation or abscess without bleeding: Secondary | ICD-10-CM | POA: Diagnosis not present

## 2020-01-24 DIAGNOSIS — Z20828 Contact with and (suspected) exposure to other viral communicable diseases: Secondary | ICD-10-CM | POA: Diagnosis not present

## 2020-01-24 DIAGNOSIS — R2681 Unsteadiness on feet: Secondary | ICD-10-CM | POA: Diagnosis not present

## 2020-01-24 DIAGNOSIS — F322 Major depressive disorder, single episode, severe without psychotic features: Secondary | ICD-10-CM | POA: Diagnosis not present

## 2020-01-24 DIAGNOSIS — R41841 Cognitive communication deficit: Secondary | ICD-10-CM | POA: Diagnosis not present

## 2020-01-24 DIAGNOSIS — I480 Paroxysmal atrial fibrillation: Secondary | ICD-10-CM | POA: Diagnosis not present

## 2020-01-24 DIAGNOSIS — S51001A Unspecified open wound of right elbow, initial encounter: Secondary | ICD-10-CM | POA: Diagnosis not present

## 2020-01-24 DIAGNOSIS — M7122 Synovial cyst of popliteal space [Baker], left knee: Secondary | ICD-10-CM | POA: Diagnosis not present

## 2020-01-24 DIAGNOSIS — Z20822 Contact with and (suspected) exposure to covid-19: Secondary | ICD-10-CM | POA: Diagnosis not present

## 2020-01-24 DIAGNOSIS — K219 Gastro-esophageal reflux disease without esophagitis: Secondary | ICD-10-CM | POA: Diagnosis not present

## 2020-01-24 DIAGNOSIS — M25562 Pain in left knee: Secondary | ICD-10-CM | POA: Diagnosis not present

## 2020-01-24 DIAGNOSIS — G453 Amaurosis fugax: Secondary | ICD-10-CM | POA: Diagnosis not present

## 2020-01-24 DIAGNOSIS — F411 Generalized anxiety disorder: Secondary | ICD-10-CM | POA: Diagnosis not present

## 2020-01-24 DIAGNOSIS — R29810 Facial weakness: Secondary | ICD-10-CM | POA: Diagnosis not present

## 2020-01-24 DIAGNOSIS — Z681 Body mass index (BMI) 19 or less, adult: Secondary | ICD-10-CM | POA: Diagnosis not present

## 2020-01-24 DIAGNOSIS — Z7901 Long term (current) use of anticoagulants: Secondary | ICD-10-CM | POA: Diagnosis not present

## 2020-01-24 DIAGNOSIS — R6520 Severe sepsis without septic shock: Secondary | ICD-10-CM | POA: Diagnosis not present

## 2020-01-24 DIAGNOSIS — S14109D Unspecified injury at unspecified level of cervical spinal cord, subsequent encounter: Secondary | ICD-10-CM | POA: Diagnosis not present

## 2020-01-24 DIAGNOSIS — F0391 Unspecified dementia with behavioral disturbance: Secondary | ICD-10-CM | POA: Diagnosis not present

## 2020-01-24 DIAGNOSIS — H6123 Impacted cerumen, bilateral: Secondary | ICD-10-CM | POA: Diagnosis not present

## 2020-01-24 DIAGNOSIS — M9902 Segmental and somatic dysfunction of thoracic region: Secondary | ICD-10-CM | POA: Diagnosis not present

## 2020-01-24 DIAGNOSIS — D849 Immunodeficiency, unspecified: Secondary | ICD-10-CM | POA: Diagnosis not present

## 2020-01-24 DIAGNOSIS — N179 Acute kidney failure, unspecified: Secondary | ICD-10-CM | POA: Diagnosis not present

## 2020-01-24 DIAGNOSIS — Z5111 Encounter for antineoplastic chemotherapy: Secondary | ICD-10-CM | POA: Diagnosis not present

## 2020-01-24 DIAGNOSIS — I69193 Ataxia following nontraumatic intracerebral hemorrhage: Secondary | ICD-10-CM | POA: Diagnosis not present

## 2020-01-24 DIAGNOSIS — H8102 Meniere's disease, left ear: Secondary | ICD-10-CM | POA: Diagnosis not present

## 2020-01-24 DIAGNOSIS — C8400 Mycosis fungoides, unspecified site: Secondary | ICD-10-CM | POA: Diagnosis not present

## 2020-01-24 DIAGNOSIS — H903 Sensorineural hearing loss, bilateral: Secondary | ICD-10-CM | POA: Diagnosis not present

## 2020-01-24 DIAGNOSIS — M4724 Other spondylosis with radiculopathy, thoracic region: Secondary | ICD-10-CM | POA: Diagnosis not present

## 2020-01-24 DIAGNOSIS — I214 Non-ST elevation (NSTEMI) myocardial infarction: Secondary | ICD-10-CM | POA: Diagnosis not present

## 2020-01-24 DIAGNOSIS — D0462 Carcinoma in situ of skin of left upper limb, including shoulder: Secondary | ICD-10-CM | POA: Diagnosis not present

## 2020-01-24 DIAGNOSIS — Z87442 Personal history of urinary calculi: Secondary | ICD-10-CM | POA: Diagnosis not present

## 2020-01-24 DIAGNOSIS — I25118 Atherosclerotic heart disease of native coronary artery with other forms of angina pectoris: Secondary | ICD-10-CM | POA: Diagnosis not present

## 2020-01-24 DIAGNOSIS — M545 Low back pain: Secondary | ICD-10-CM | POA: Diagnosis not present

## 2020-01-24 DIAGNOSIS — C3412 Malignant neoplasm of upper lobe, left bronchus or lung: Secondary | ICD-10-CM | POA: Diagnosis not present

## 2020-01-24 DIAGNOSIS — Z8679 Personal history of other diseases of the circulatory system: Secondary | ICD-10-CM | POA: Diagnosis not present

## 2020-01-24 DIAGNOSIS — L97812 Non-pressure chronic ulcer of other part of right lower leg with fat layer exposed: Secondary | ICD-10-CM | POA: Diagnosis not present

## 2020-01-24 DIAGNOSIS — M1612 Unilateral primary osteoarthritis, left hip: Secondary | ICD-10-CM | POA: Diagnosis not present

## 2020-01-24 DIAGNOSIS — F3342 Major depressive disorder, recurrent, in full remission: Secondary | ICD-10-CM | POA: Diagnosis not present

## 2020-01-24 DIAGNOSIS — Z5189 Encounter for other specified aftercare: Secondary | ICD-10-CM | POA: Diagnosis not present

## 2020-01-24 DIAGNOSIS — H9190 Unspecified hearing loss, unspecified ear: Secondary | ICD-10-CM | POA: Diagnosis not present

## 2020-01-24 DIAGNOSIS — Z923 Personal history of irradiation: Secondary | ICD-10-CM | POA: Diagnosis not present

## 2020-01-24 DIAGNOSIS — F339 Major depressive disorder, recurrent, unspecified: Secondary | ICD-10-CM | POA: Diagnosis not present

## 2020-01-24 DIAGNOSIS — Z950 Presence of cardiac pacemaker: Secondary | ICD-10-CM | POA: Diagnosis not present

## 2020-01-24 DIAGNOSIS — K648 Other hemorrhoids: Secondary | ICD-10-CM | POA: Diagnosis not present

## 2020-01-25 DIAGNOSIS — G20 Parkinson's disease: Secondary | ICD-10-CM | POA: Diagnosis not present

## 2020-01-25 DIAGNOSIS — Z01812 Encounter for preprocedural laboratory examination: Secondary | ICD-10-CM | POA: Diagnosis not present

## 2020-01-25 DIAGNOSIS — I2510 Atherosclerotic heart disease of native coronary artery without angina pectoris: Secondary | ICD-10-CM | POA: Diagnosis not present

## 2020-01-25 DIAGNOSIS — M797 Fibromyalgia: Secondary | ICD-10-CM | POA: Diagnosis not present

## 2020-01-25 DIAGNOSIS — I130 Hypertensive heart and chronic kidney disease with heart failure and stage 1 through stage 4 chronic kidney disease, or unspecified chronic kidney disease: Secondary | ICD-10-CM | POA: Diagnosis not present

## 2020-01-25 DIAGNOSIS — M1611 Unilateral primary osteoarthritis, right hip: Secondary | ICD-10-CM | POA: Diagnosis not present

## 2020-01-25 DIAGNOSIS — E669 Obesity, unspecified: Secondary | ICD-10-CM | POA: Diagnosis not present

## 2020-01-25 DIAGNOSIS — C61 Malignant neoplasm of prostate: Secondary | ICD-10-CM | POA: Diagnosis not present

## 2020-01-25 DIAGNOSIS — N1831 Chronic kidney disease, stage 3a: Secondary | ICD-10-CM | POA: Diagnosis not present

## 2020-01-25 DIAGNOSIS — Z8669 Personal history of other diseases of the nervous system and sense organs: Secondary | ICD-10-CM | POA: Diagnosis not present

## 2020-01-25 DIAGNOSIS — M47816 Spondylosis without myelopathy or radiculopathy, lumbar region: Secondary | ICD-10-CM | POA: Diagnosis not present

## 2020-01-25 DIAGNOSIS — R1013 Epigastric pain: Secondary | ICD-10-CM | POA: Diagnosis not present

## 2020-01-25 DIAGNOSIS — J449 Chronic obstructive pulmonary disease, unspecified: Secondary | ICD-10-CM | POA: Diagnosis not present

## 2020-01-25 DIAGNOSIS — M25562 Pain in left knee: Secondary | ICD-10-CM | POA: Diagnosis not present

## 2020-01-25 DIAGNOSIS — S82261D Displaced segmental fracture of shaft of right tibia, subsequent encounter for closed fracture with routine healing: Secondary | ICD-10-CM | POA: Diagnosis not present

## 2020-01-25 DIAGNOSIS — Z1329 Encounter for screening for other suspected endocrine disorder: Secondary | ICD-10-CM | POA: Diagnosis not present

## 2020-01-25 DIAGNOSIS — K589 Irritable bowel syndrome without diarrhea: Secondary | ICD-10-CM | POA: Diagnosis not present

## 2020-01-25 DIAGNOSIS — S72002D Fracture of unspecified part of neck of left femur, subsequent encounter for closed fracture with routine healing: Secondary | ICD-10-CM | POA: Diagnosis not present

## 2020-01-25 DIAGNOSIS — F322 Major depressive disorder, single episode, severe without psychotic features: Secondary | ICD-10-CM | POA: Diagnosis not present

## 2020-01-25 DIAGNOSIS — I6529 Occlusion and stenosis of unspecified carotid artery: Secondary | ICD-10-CM | POA: Diagnosis not present

## 2020-01-25 DIAGNOSIS — Z978 Presence of other specified devices: Secondary | ICD-10-CM | POA: Diagnosis not present

## 2020-01-25 DIAGNOSIS — D485 Neoplasm of uncertain behavior of skin: Secondary | ICD-10-CM | POA: Diagnosis not present

## 2020-01-25 DIAGNOSIS — K7460 Unspecified cirrhosis of liver: Secondary | ICD-10-CM | POA: Diagnosis not present

## 2020-01-25 DIAGNOSIS — K861 Other chronic pancreatitis: Secondary | ICD-10-CM | POA: Diagnosis not present

## 2020-01-25 DIAGNOSIS — N289 Disorder of kidney and ureter, unspecified: Secondary | ICD-10-CM | POA: Diagnosis not present

## 2020-01-25 DIAGNOSIS — H43813 Vitreous degeneration, bilateral: Secondary | ICD-10-CM | POA: Diagnosis not present

## 2020-01-25 DIAGNOSIS — L578 Other skin changes due to chronic exposure to nonionizing radiation: Secondary | ICD-10-CM | POA: Diagnosis not present

## 2020-01-25 DIAGNOSIS — S42211A Unspecified displaced fracture of surgical neck of right humerus, initial encounter for closed fracture: Secondary | ICD-10-CM | POA: Diagnosis not present

## 2020-01-25 DIAGNOSIS — D692 Other nonthrombocytopenic purpura: Secondary | ICD-10-CM | POA: Diagnosis not present

## 2020-01-25 DIAGNOSIS — B351 Tinea unguium: Secondary | ICD-10-CM | POA: Diagnosis not present

## 2020-01-25 DIAGNOSIS — J441 Chronic obstructive pulmonary disease with (acute) exacerbation: Secondary | ICD-10-CM | POA: Diagnosis not present

## 2020-01-25 DIAGNOSIS — T451X5A Adverse effect of antineoplastic and immunosuppressive drugs, initial encounter: Secondary | ICD-10-CM | POA: Diagnosis not present

## 2020-01-25 DIAGNOSIS — F319 Bipolar disorder, unspecified: Secondary | ICD-10-CM | POA: Diagnosis not present

## 2020-01-25 DIAGNOSIS — N184 Chronic kidney disease, stage 4 (severe): Secondary | ICD-10-CM | POA: Diagnosis not present

## 2020-01-25 DIAGNOSIS — E559 Vitamin D deficiency, unspecified: Secondary | ICD-10-CM | POA: Diagnosis not present

## 2020-01-25 DIAGNOSIS — S01511A Laceration without foreign body of lip, initial encounter: Secondary | ICD-10-CM | POA: Diagnosis not present

## 2020-01-25 DIAGNOSIS — F324 Major depressive disorder, single episode, in partial remission: Secondary | ICD-10-CM | POA: Diagnosis not present

## 2020-01-25 DIAGNOSIS — D649 Anemia, unspecified: Secondary | ICD-10-CM | POA: Diagnosis not present

## 2020-01-25 DIAGNOSIS — Z7409 Other reduced mobility: Secondary | ICD-10-CM | POA: Diagnosis not present

## 2020-01-25 DIAGNOSIS — E781 Pure hyperglyceridemia: Secondary | ICD-10-CM | POA: Diagnosis not present

## 2020-01-25 DIAGNOSIS — K8020 Calculus of gallbladder without cholecystitis without obstruction: Secondary | ICD-10-CM | POA: Diagnosis not present

## 2020-01-25 DIAGNOSIS — I723 Aneurysm of iliac artery: Secondary | ICD-10-CM | POA: Diagnosis not present

## 2020-01-25 DIAGNOSIS — D72829 Elevated white blood cell count, unspecified: Secondary | ICD-10-CM | POA: Diagnosis not present

## 2020-01-25 DIAGNOSIS — Z170 Estrogen receptor positive status [ER+]: Secondary | ICD-10-CM | POA: Diagnosis not present

## 2020-01-25 DIAGNOSIS — M5136 Other intervertebral disc degeneration, lumbar region: Secondary | ICD-10-CM | POA: Diagnosis not present

## 2020-01-25 DIAGNOSIS — Z87442 Personal history of urinary calculi: Secondary | ICD-10-CM | POA: Diagnosis not present

## 2020-01-25 DIAGNOSIS — Z961 Presence of intraocular lens: Secondary | ICD-10-CM | POA: Diagnosis not present

## 2020-01-25 DIAGNOSIS — K5720 Diverticulitis of large intestine with perforation and abscess without bleeding: Secondary | ICD-10-CM | POA: Diagnosis not present

## 2020-01-25 DIAGNOSIS — H353112 Nonexudative age-related macular degeneration, right eye, intermediate dry stage: Secondary | ICD-10-CM | POA: Diagnosis not present

## 2020-01-25 DIAGNOSIS — J470 Bronchiectasis with acute lower respiratory infection: Secondary | ICD-10-CM | POA: Diagnosis not present

## 2020-01-25 DIAGNOSIS — N898 Other specified noninflammatory disorders of vagina: Secondary | ICD-10-CM | POA: Diagnosis not present

## 2020-01-25 DIAGNOSIS — H35363 Drusen (degenerative) of macula, bilateral: Secondary | ICD-10-CM | POA: Diagnosis not present

## 2020-01-25 DIAGNOSIS — M25672 Stiffness of left ankle, not elsewhere classified: Secondary | ICD-10-CM | POA: Diagnosis not present

## 2020-01-25 DIAGNOSIS — G629 Polyneuropathy, unspecified: Secondary | ICD-10-CM | POA: Diagnosis not present

## 2020-01-25 DIAGNOSIS — S40011A Contusion of right shoulder, initial encounter: Secondary | ICD-10-CM | POA: Diagnosis not present

## 2020-01-25 DIAGNOSIS — Z1283 Encounter for screening for malignant neoplasm of skin: Secondary | ICD-10-CM | POA: Diagnosis not present

## 2020-01-25 DIAGNOSIS — R410 Disorientation, unspecified: Secondary | ICD-10-CM | POA: Diagnosis not present

## 2020-01-25 DIAGNOSIS — I5040 Unspecified combined systolic (congestive) and diastolic (congestive) heart failure: Secondary | ICD-10-CM | POA: Diagnosis not present

## 2020-01-25 DIAGNOSIS — I509 Heart failure, unspecified: Secondary | ICD-10-CM | POA: Diagnosis not present

## 2020-01-25 DIAGNOSIS — K8689 Other specified diseases of pancreas: Secondary | ICD-10-CM | POA: Diagnosis not present

## 2020-01-25 DIAGNOSIS — C50912 Malignant neoplasm of unspecified site of left female breast: Secondary | ICD-10-CM | POA: Diagnosis not present

## 2020-01-25 DIAGNOSIS — R262 Difficulty in walking, not elsewhere classified: Secondary | ICD-10-CM | POA: Diagnosis not present

## 2020-01-25 DIAGNOSIS — R296 Repeated falls: Secondary | ICD-10-CM | POA: Diagnosis not present

## 2020-01-25 DIAGNOSIS — Z79810 Long term (current) use of selective estrogen receptor modulators (SERMs): Secondary | ICD-10-CM | POA: Diagnosis not present

## 2020-01-25 DIAGNOSIS — M8580 Other specified disorders of bone density and structure, unspecified site: Secondary | ICD-10-CM | POA: Diagnosis not present

## 2020-01-25 DIAGNOSIS — E871 Hypo-osmolality and hyponatremia: Secondary | ICD-10-CM | POA: Diagnosis not present

## 2020-01-25 DIAGNOSIS — F329 Major depressive disorder, single episode, unspecified: Secondary | ICD-10-CM | POA: Diagnosis not present

## 2020-01-25 DIAGNOSIS — Z743 Need for continuous supervision: Secondary | ICD-10-CM | POA: Diagnosis not present

## 2020-01-25 DIAGNOSIS — Z006 Encounter for examination for normal comparison and control in clinical research program: Secondary | ICD-10-CM | POA: Diagnosis not present

## 2020-01-25 DIAGNOSIS — I350 Nonrheumatic aortic (valve) stenosis: Secondary | ICD-10-CM | POA: Diagnosis not present

## 2020-01-25 DIAGNOSIS — Z951 Presence of aortocoronary bypass graft: Secondary | ICD-10-CM | POA: Diagnosis not present

## 2020-01-25 DIAGNOSIS — Z5181 Encounter for therapeutic drug level monitoring: Secondary | ICD-10-CM | POA: Diagnosis not present

## 2020-01-25 DIAGNOSIS — Z20822 Contact with and (suspected) exposure to covid-19: Secondary | ICD-10-CM | POA: Diagnosis not present

## 2020-01-25 DIAGNOSIS — I639 Cerebral infarction, unspecified: Secondary | ICD-10-CM | POA: Diagnosis not present

## 2020-01-25 DIAGNOSIS — R251 Tremor, unspecified: Secondary | ICD-10-CM | POA: Diagnosis not present

## 2020-01-25 DIAGNOSIS — M1990 Unspecified osteoarthritis, unspecified site: Secondary | ICD-10-CM | POA: Diagnosis not present

## 2020-01-25 DIAGNOSIS — N3000 Acute cystitis without hematuria: Secondary | ICD-10-CM | POA: Diagnosis not present

## 2020-01-25 DIAGNOSIS — H35453 Secondary pigmentary degeneration, bilateral: Secondary | ICD-10-CM | POA: Diagnosis not present

## 2020-01-25 DIAGNOSIS — M542 Cervicalgia: Secondary | ICD-10-CM | POA: Diagnosis not present

## 2020-01-25 DIAGNOSIS — Z7984 Long term (current) use of oral hypoglycemic drugs: Secondary | ICD-10-CM | POA: Diagnosis not present

## 2020-01-25 DIAGNOSIS — Z0001 Encounter for general adult medical examination with abnormal findings: Secondary | ICD-10-CM | POA: Diagnosis not present

## 2020-01-25 DIAGNOSIS — Z823 Family history of stroke: Secondary | ICD-10-CM | POA: Diagnosis not present

## 2020-01-25 DIAGNOSIS — D2372 Other benign neoplasm of skin of left lower limb, including hip: Secondary | ICD-10-CM | POA: Diagnosis not present

## 2020-01-25 DIAGNOSIS — G4730 Sleep apnea, unspecified: Secondary | ICD-10-CM | POA: Diagnosis not present

## 2020-01-25 DIAGNOSIS — M5116 Intervertebral disc disorders with radiculopathy, lumbar region: Secondary | ICD-10-CM | POA: Diagnosis not present

## 2020-01-25 DIAGNOSIS — D1801 Hemangioma of skin and subcutaneous tissue: Secondary | ICD-10-CM | POA: Diagnosis not present

## 2020-01-25 DIAGNOSIS — Z96651 Presence of right artificial knee joint: Secondary | ICD-10-CM | POA: Diagnosis not present

## 2020-01-25 DIAGNOSIS — R5382 Chronic fatigue, unspecified: Secondary | ICD-10-CM | POA: Diagnosis not present

## 2020-01-25 DIAGNOSIS — I2582 Chronic total occlusion of coronary artery: Secondary | ICD-10-CM | POA: Diagnosis not present

## 2020-01-25 DIAGNOSIS — J3081 Allergic rhinitis due to animal (cat) (dog) hair and dander: Secondary | ICD-10-CM | POA: Diagnosis not present

## 2020-01-25 DIAGNOSIS — Z955 Presence of coronary angioplasty implant and graft: Secondary | ICD-10-CM | POA: Diagnosis not present

## 2020-01-25 DIAGNOSIS — N390 Urinary tract infection, site not specified: Secondary | ICD-10-CM | POA: Diagnosis not present

## 2020-01-25 DIAGNOSIS — K581 Irritable bowel syndrome with constipation: Secondary | ICD-10-CM | POA: Diagnosis not present

## 2020-01-25 DIAGNOSIS — R55 Syncope and collapse: Secondary | ICD-10-CM | POA: Diagnosis not present

## 2020-01-25 DIAGNOSIS — D696 Thrombocytopenia, unspecified: Secondary | ICD-10-CM | POA: Diagnosis not present

## 2020-01-25 DIAGNOSIS — R05 Cough: Secondary | ICD-10-CM | POA: Diagnosis not present

## 2020-01-25 DIAGNOSIS — E119 Type 2 diabetes mellitus without complications: Secondary | ICD-10-CM | POA: Diagnosis not present

## 2020-01-25 DIAGNOSIS — E860 Dehydration: Secondary | ICD-10-CM | POA: Diagnosis not present

## 2020-01-25 DIAGNOSIS — I480 Paroxysmal atrial fibrillation: Secondary | ICD-10-CM | POA: Diagnosis not present

## 2020-01-25 DIAGNOSIS — E1149 Type 2 diabetes mellitus with other diabetic neurological complication: Secondary | ICD-10-CM | POA: Diagnosis not present

## 2020-01-25 DIAGNOSIS — Z86010 Personal history of colonic polyps: Secondary | ICD-10-CM | POA: Diagnosis not present

## 2020-01-25 DIAGNOSIS — L853 Xerosis cutis: Secondary | ICD-10-CM | POA: Diagnosis not present

## 2020-01-25 DIAGNOSIS — J45909 Unspecified asthma, uncomplicated: Secondary | ICD-10-CM | POA: Diagnosis not present

## 2020-01-25 DIAGNOSIS — D863 Sarcoidosis of skin: Secondary | ICD-10-CM | POA: Diagnosis not present

## 2020-01-25 DIAGNOSIS — N281 Cyst of kidney, acquired: Secondary | ICD-10-CM | POA: Diagnosis not present

## 2020-01-25 DIAGNOSIS — F0280 Dementia in other diseases classified elsewhere without behavioral disturbance: Secondary | ICD-10-CM | POA: Diagnosis not present

## 2020-01-25 DIAGNOSIS — K862 Cyst of pancreas: Secondary | ICD-10-CM | POA: Diagnosis not present

## 2020-01-25 DIAGNOSIS — M25612 Stiffness of left shoulder, not elsewhere classified: Secondary | ICD-10-CM | POA: Diagnosis not present

## 2020-01-25 DIAGNOSIS — I6523 Occlusion and stenosis of bilateral carotid arteries: Secondary | ICD-10-CM | POA: Diagnosis not present

## 2020-01-25 DIAGNOSIS — L570 Actinic keratosis: Secondary | ICD-10-CM | POA: Diagnosis not present

## 2020-01-25 DIAGNOSIS — L821 Other seborrheic keratosis: Secondary | ICD-10-CM | POA: Diagnosis not present

## 2020-01-25 DIAGNOSIS — I129 Hypertensive chronic kidney disease with stage 1 through stage 4 chronic kidney disease, or unspecified chronic kidney disease: Secondary | ICD-10-CM | POA: Diagnosis not present

## 2020-01-25 DIAGNOSIS — G43109 Migraine with aura, not intractable, without status migrainosus: Secondary | ICD-10-CM | POA: Diagnosis not present

## 2020-01-25 DIAGNOSIS — G709 Myoneural disorder, unspecified: Secondary | ICD-10-CM | POA: Diagnosis not present

## 2020-01-25 DIAGNOSIS — K922 Gastrointestinal hemorrhage, unspecified: Secondary | ICD-10-CM | POA: Diagnosis not present

## 2020-01-25 DIAGNOSIS — Z86718 Personal history of other venous thrombosis and embolism: Secondary | ICD-10-CM | POA: Diagnosis not present

## 2020-01-25 DIAGNOSIS — M25512 Pain in left shoulder: Secondary | ICD-10-CM | POA: Diagnosis not present

## 2020-01-25 DIAGNOSIS — M1711 Unilateral primary osteoarthritis, right knee: Secondary | ICD-10-CM | POA: Diagnosis not present

## 2020-01-25 DIAGNOSIS — M9904 Segmental and somatic dysfunction of sacral region: Secondary | ICD-10-CM | POA: Diagnosis not present

## 2020-01-25 DIAGNOSIS — D1771 Benign lipomatous neoplasm of kidney: Secondary | ICD-10-CM | POA: Diagnosis not present

## 2020-01-25 DIAGNOSIS — H34833 Tributary (branch) retinal vein occlusion, bilateral, with macular edema: Secondary | ICD-10-CM | POA: Diagnosis not present

## 2020-01-25 DIAGNOSIS — Z6837 Body mass index (BMI) 37.0-37.9, adult: Secondary | ICD-10-CM | POA: Diagnosis not present

## 2020-01-25 DIAGNOSIS — L219 Seborrheic dermatitis, unspecified: Secondary | ICD-10-CM | POA: Diagnosis not present

## 2020-01-25 DIAGNOSIS — R0689 Other abnormalities of breathing: Secondary | ICD-10-CM | POA: Diagnosis not present

## 2020-01-25 DIAGNOSIS — C50412 Malignant neoplasm of upper-outer quadrant of left female breast: Secondary | ICD-10-CM | POA: Diagnosis not present

## 2020-01-25 DIAGNOSIS — I252 Old myocardial infarction: Secondary | ICD-10-CM | POA: Diagnosis not present

## 2020-01-25 DIAGNOSIS — R918 Other nonspecific abnormal finding of lung field: Secondary | ICD-10-CM | POA: Diagnosis not present

## 2020-01-25 DIAGNOSIS — M9903 Segmental and somatic dysfunction of lumbar region: Secondary | ICD-10-CM | POA: Diagnosis not present

## 2020-01-25 DIAGNOSIS — R2689 Other abnormalities of gait and mobility: Secondary | ICD-10-CM | POA: Diagnosis not present

## 2020-01-25 DIAGNOSIS — J849 Interstitial pulmonary disease, unspecified: Secondary | ICD-10-CM | POA: Diagnosis not present

## 2020-01-25 DIAGNOSIS — M791 Myalgia, unspecified site: Secondary | ICD-10-CM | POA: Diagnosis not present

## 2020-01-25 DIAGNOSIS — M4316 Spondylolisthesis, lumbar region: Secondary | ICD-10-CM | POA: Diagnosis not present

## 2020-01-25 DIAGNOSIS — R279 Unspecified lack of coordination: Secondary | ICD-10-CM | POA: Diagnosis not present

## 2020-01-25 DIAGNOSIS — Z8249 Family history of ischemic heart disease and other diseases of the circulatory system: Secondary | ICD-10-CM | POA: Diagnosis not present

## 2020-01-25 DIAGNOSIS — M25531 Pain in right wrist: Secondary | ICD-10-CM | POA: Diagnosis not present

## 2020-01-25 DIAGNOSIS — E039 Hypothyroidism, unspecified: Secondary | ICD-10-CM | POA: Diagnosis not present

## 2020-01-25 DIAGNOSIS — M48061 Spinal stenosis, lumbar region without neurogenic claudication: Secondary | ICD-10-CM | POA: Diagnosis not present

## 2020-01-25 DIAGNOSIS — G301 Alzheimer's disease with late onset: Secondary | ICD-10-CM | POA: Diagnosis not present

## 2020-01-25 DIAGNOSIS — I4819 Other persistent atrial fibrillation: Secondary | ICD-10-CM | POA: Diagnosis not present

## 2020-01-25 DIAGNOSIS — M1712 Unilateral primary osteoarthritis, left knee: Secondary | ICD-10-CM | POA: Diagnosis not present

## 2020-01-25 DIAGNOSIS — M5417 Radiculopathy, lumbosacral region: Secondary | ICD-10-CM | POA: Diagnosis not present

## 2020-01-25 DIAGNOSIS — F209 Schizophrenia, unspecified: Secondary | ICD-10-CM | POA: Diagnosis not present

## 2020-01-25 DIAGNOSIS — N323 Diverticulum of bladder: Secondary | ICD-10-CM | POA: Diagnosis not present

## 2020-01-25 DIAGNOSIS — J3089 Other allergic rhinitis: Secondary | ICD-10-CM | POA: Diagnosis not present

## 2020-01-25 DIAGNOSIS — D045 Carcinoma in situ of skin of trunk: Secondary | ICD-10-CM | POA: Diagnosis not present

## 2020-01-25 DIAGNOSIS — M7751 Other enthesopathy of right foot: Secondary | ICD-10-CM | POA: Diagnosis not present

## 2020-01-25 DIAGNOSIS — E785 Hyperlipidemia, unspecified: Secondary | ICD-10-CM | POA: Diagnosis not present

## 2020-01-25 DIAGNOSIS — R569 Unspecified convulsions: Secondary | ICD-10-CM | POA: Diagnosis not present

## 2020-01-25 DIAGNOSIS — M7070 Other bursitis of hip, unspecified hip: Secondary | ICD-10-CM | POA: Diagnosis not present

## 2020-01-25 DIAGNOSIS — M170 Bilateral primary osteoarthritis of knee: Secondary | ICD-10-CM | POA: Diagnosis not present

## 2020-01-25 DIAGNOSIS — M4802 Spinal stenosis, cervical region: Secondary | ICD-10-CM | POA: Diagnosis not present

## 2020-01-25 DIAGNOSIS — M6281 Muscle weakness (generalized): Secondary | ICD-10-CM | POA: Diagnosis not present

## 2020-01-25 DIAGNOSIS — E1122 Type 2 diabetes mellitus with diabetic chronic kidney disease: Secondary | ICD-10-CM | POA: Diagnosis not present

## 2020-01-25 DIAGNOSIS — Z905 Acquired absence of kidney: Secondary | ICD-10-CM | POA: Diagnosis not present

## 2020-01-25 DIAGNOSIS — K635 Polyp of colon: Secondary | ICD-10-CM | POA: Diagnosis not present

## 2020-01-25 DIAGNOSIS — D6859 Other primary thrombophilia: Secondary | ICD-10-CM | POA: Diagnosis not present

## 2020-01-25 DIAGNOSIS — G2581 Restless legs syndrome: Secondary | ICD-10-CM | POA: Diagnosis not present

## 2020-01-25 DIAGNOSIS — M25561 Pain in right knee: Secondary | ICD-10-CM | POA: Diagnosis not present

## 2020-01-25 DIAGNOSIS — M438X6 Other specified deforming dorsopathies, lumbar region: Secondary | ICD-10-CM | POA: Diagnosis not present

## 2020-01-25 DIAGNOSIS — N182 Chronic kidney disease, stage 2 (mild): Secondary | ICD-10-CM | POA: Diagnosis not present

## 2020-01-25 DIAGNOSIS — C7951 Secondary malignant neoplasm of bone: Secondary | ICD-10-CM | POA: Diagnosis not present

## 2020-01-25 DIAGNOSIS — G8929 Other chronic pain: Secondary | ICD-10-CM | POA: Diagnosis not present

## 2020-01-25 DIAGNOSIS — I10 Essential (primary) hypertension: Secondary | ICD-10-CM | POA: Diagnosis not present

## 2020-01-25 DIAGNOSIS — R21 Rash and other nonspecific skin eruption: Secondary | ICD-10-CM | POA: Diagnosis not present

## 2020-01-25 DIAGNOSIS — H259 Unspecified age-related cataract: Secondary | ICD-10-CM | POA: Diagnosis not present

## 2020-01-25 DIAGNOSIS — I160 Hypertensive urgency: Secondary | ICD-10-CM | POA: Diagnosis not present

## 2020-01-25 DIAGNOSIS — C50911 Malignant neoplasm of unspecified site of right female breast: Secondary | ICD-10-CM | POA: Diagnosis not present

## 2020-01-25 DIAGNOSIS — Z7952 Long term (current) use of systemic steroids: Secondary | ICD-10-CM | POA: Diagnosis not present

## 2020-01-25 DIAGNOSIS — Z885 Allergy status to narcotic agent status: Secondary | ICD-10-CM | POA: Diagnosis not present

## 2020-01-25 DIAGNOSIS — I5032 Chronic diastolic (congestive) heart failure: Secondary | ICD-10-CM | POA: Diagnosis not present

## 2020-01-25 DIAGNOSIS — E1165 Type 2 diabetes mellitus with hyperglycemia: Secondary | ICD-10-CM | POA: Diagnosis not present

## 2020-01-25 DIAGNOSIS — H409 Unspecified glaucoma: Secondary | ICD-10-CM | POA: Diagnosis not present

## 2020-01-25 DIAGNOSIS — Z7901 Long term (current) use of anticoagulants: Secondary | ICD-10-CM | POA: Diagnosis not present

## 2020-01-25 DIAGNOSIS — M179 Osteoarthritis of knee, unspecified: Secondary | ICD-10-CM | POA: Diagnosis not present

## 2020-01-25 DIAGNOSIS — E1142 Type 2 diabetes mellitus with diabetic polyneuropathy: Secondary | ICD-10-CM | POA: Diagnosis not present

## 2020-01-25 DIAGNOSIS — K5730 Diverticulosis of large intestine without perforation or abscess without bleeding: Secondary | ICD-10-CM | POA: Diagnosis not present

## 2020-01-25 DIAGNOSIS — N400 Enlarged prostate without lower urinary tract symptoms: Secondary | ICD-10-CM | POA: Diagnosis not present

## 2020-01-25 DIAGNOSIS — K828 Other specified diseases of gallbladder: Secondary | ICD-10-CM | POA: Diagnosis not present

## 2020-01-25 DIAGNOSIS — F3342 Major depressive disorder, recurrent, in full remission: Secondary | ICD-10-CM | POA: Diagnosis not present

## 2020-01-25 DIAGNOSIS — D0461 Carcinoma in situ of skin of right upper limb, including shoulder: Secondary | ICD-10-CM | POA: Diagnosis not present

## 2020-01-25 DIAGNOSIS — Z8719 Personal history of other diseases of the digestive system: Secondary | ICD-10-CM | POA: Diagnosis not present

## 2020-01-25 DIAGNOSIS — L271 Localized skin eruption due to drugs and medicaments taken internally: Secondary | ICD-10-CM | POA: Diagnosis not present

## 2020-01-25 DIAGNOSIS — I5043 Acute on chronic combined systolic (congestive) and diastolic (congestive) heart failure: Secondary | ICD-10-CM | POA: Diagnosis not present

## 2020-01-25 DIAGNOSIS — Z23 Encounter for immunization: Secondary | ICD-10-CM | POA: Diagnosis not present

## 2020-01-25 DIAGNOSIS — C680 Malignant neoplasm of urethra: Secondary | ICD-10-CM | POA: Diagnosis not present

## 2020-01-25 DIAGNOSIS — F17210 Nicotine dependence, cigarettes, uncomplicated: Secondary | ICD-10-CM | POA: Diagnosis not present

## 2020-01-25 DIAGNOSIS — E876 Hypokalemia: Secondary | ICD-10-CM | POA: Diagnosis not present

## 2020-01-25 DIAGNOSIS — M25662 Stiffness of left knee, not elsewhere classified: Secondary | ICD-10-CM | POA: Diagnosis not present

## 2020-01-25 DIAGNOSIS — Z8546 Personal history of malignant neoplasm of prostate: Secondary | ICD-10-CM | POA: Diagnosis not present

## 2020-01-25 DIAGNOSIS — I77810 Thoracic aortic ectasia: Secondary | ICD-10-CM | POA: Diagnosis not present

## 2020-01-25 DIAGNOSIS — G459 Transient cerebral ischemic attack, unspecified: Secondary | ICD-10-CM | POA: Diagnosis not present

## 2020-01-25 DIAGNOSIS — C7931 Secondary malignant neoplasm of brain: Secondary | ICD-10-CM | POA: Diagnosis not present

## 2020-01-25 DIAGNOSIS — M19041 Primary osteoarthritis, right hand: Secondary | ICD-10-CM | POA: Diagnosis not present

## 2020-01-25 DIAGNOSIS — K5900 Constipation, unspecified: Secondary | ICD-10-CM | POA: Diagnosis not present

## 2020-01-25 DIAGNOSIS — Z1159 Encounter for screening for other viral diseases: Secondary | ICD-10-CM | POA: Diagnosis not present

## 2020-01-25 DIAGNOSIS — L300 Nummular dermatitis: Secondary | ICD-10-CM | POA: Diagnosis not present

## 2020-01-25 DIAGNOSIS — E1169 Type 2 diabetes mellitus with other specified complication: Secondary | ICD-10-CM | POA: Diagnosis not present

## 2020-01-25 DIAGNOSIS — M79676 Pain in unspecified toe(s): Secondary | ICD-10-CM | POA: Diagnosis not present

## 2020-01-25 DIAGNOSIS — J431 Panlobular emphysema: Secondary | ICD-10-CM | POA: Diagnosis not present

## 2020-01-25 DIAGNOSIS — D638 Anemia in other chronic diseases classified elsewhere: Secondary | ICD-10-CM | POA: Diagnosis not present

## 2020-01-25 DIAGNOSIS — E7849 Other hyperlipidemia: Secondary | ICD-10-CM | POA: Diagnosis not present

## 2020-01-25 DIAGNOSIS — G894 Chronic pain syndrome: Secondary | ICD-10-CM | POA: Diagnosis not present

## 2020-01-25 DIAGNOSIS — Z85828 Personal history of other malignant neoplasm of skin: Secondary | ICD-10-CM | POA: Diagnosis not present

## 2020-01-25 DIAGNOSIS — C779 Secondary and unspecified malignant neoplasm of lymph node, unspecified: Secondary | ICD-10-CM | POA: Diagnosis not present

## 2020-01-25 DIAGNOSIS — M5416 Radiculopathy, lumbar region: Secondary | ICD-10-CM | POA: Diagnosis not present

## 2020-01-25 DIAGNOSIS — M545 Low back pain: Secondary | ICD-10-CM | POA: Diagnosis not present

## 2020-01-25 DIAGNOSIS — M5117 Intervertebral disc disorders with radiculopathy, lumbosacral region: Secondary | ICD-10-CM | POA: Diagnosis not present

## 2020-01-25 DIAGNOSIS — R413 Other amnesia: Secondary | ICD-10-CM | POA: Diagnosis not present

## 2020-01-25 DIAGNOSIS — I4821 Permanent atrial fibrillation: Secondary | ICD-10-CM | POA: Diagnosis not present

## 2020-01-25 DIAGNOSIS — Z1231 Encounter for screening mammogram for malignant neoplasm of breast: Secondary | ICD-10-CM | POA: Diagnosis not present

## 2020-01-25 DIAGNOSIS — R7989 Other specified abnormal findings of blood chemistry: Secondary | ICD-10-CM | POA: Diagnosis not present

## 2020-01-25 DIAGNOSIS — Z79899 Other long term (current) drug therapy: Secondary | ICD-10-CM | POA: Diagnosis not present

## 2020-01-25 DIAGNOSIS — F419 Anxiety disorder, unspecified: Secondary | ICD-10-CM | POA: Diagnosis not present

## 2020-01-25 DIAGNOSIS — C773 Secondary and unspecified malignant neoplasm of axilla and upper limb lymph nodes: Secondary | ICD-10-CM | POA: Diagnosis not present

## 2020-01-25 DIAGNOSIS — G4700 Insomnia, unspecified: Secondary | ICD-10-CM | POA: Diagnosis not present

## 2020-01-25 DIAGNOSIS — N183 Chronic kidney disease, stage 3 unspecified: Secondary | ICD-10-CM | POA: Diagnosis not present

## 2020-01-25 DIAGNOSIS — G7109 Other specified muscular dystrophies: Secondary | ICD-10-CM | POA: Diagnosis not present

## 2020-01-25 DIAGNOSIS — R519 Headache, unspecified: Secondary | ICD-10-CM | POA: Diagnosis not present

## 2020-01-25 DIAGNOSIS — E440 Moderate protein-calorie malnutrition: Secondary | ICD-10-CM | POA: Diagnosis not present

## 2020-01-25 DIAGNOSIS — M159 Polyosteoarthritis, unspecified: Secondary | ICD-10-CM | POA: Diagnosis not present

## 2020-01-25 DIAGNOSIS — Z9181 History of falling: Secondary | ICD-10-CM | POA: Diagnosis not present

## 2020-01-25 DIAGNOSIS — F411 Generalized anxiety disorder: Secondary | ICD-10-CM | POA: Diagnosis not present

## 2020-01-25 DIAGNOSIS — H43392 Other vitreous opacities, left eye: Secondary | ICD-10-CM | POA: Diagnosis not present

## 2020-01-25 DIAGNOSIS — I700 Atherosclerosis of aorta: Secondary | ICD-10-CM | POA: Diagnosis not present

## 2020-01-25 DIAGNOSIS — Z0000 Encounter for general adult medical examination without abnormal findings: Secondary | ICD-10-CM | POA: Diagnosis not present

## 2020-01-25 DIAGNOSIS — M4696 Unspecified inflammatory spondylopathy, lumbar region: Secondary | ICD-10-CM | POA: Diagnosis not present

## 2020-01-25 DIAGNOSIS — M1610 Unilateral primary osteoarthritis, unspecified hip: Secondary | ICD-10-CM | POA: Diagnosis not present

## 2020-01-25 DIAGNOSIS — Z471 Aftercare following joint replacement surgery: Secondary | ICD-10-CM | POA: Diagnosis not present

## 2020-01-25 DIAGNOSIS — R1110 Vomiting, unspecified: Secondary | ICD-10-CM | POA: Diagnosis not present

## 2020-01-25 DIAGNOSIS — R42 Dizziness and giddiness: Secondary | ICD-10-CM | POA: Diagnosis not present

## 2020-01-25 DIAGNOSIS — I4891 Unspecified atrial fibrillation: Secondary | ICD-10-CM | POA: Diagnosis not present

## 2020-01-25 DIAGNOSIS — C44629 Squamous cell carcinoma of skin of left upper limb, including shoulder: Secondary | ICD-10-CM | POA: Diagnosis not present

## 2020-01-25 DIAGNOSIS — Z01810 Encounter for preprocedural cardiovascular examination: Secondary | ICD-10-CM | POA: Diagnosis not present

## 2020-01-25 DIAGNOSIS — C44319 Basal cell carcinoma of skin of other parts of face: Secondary | ICD-10-CM | POA: Diagnosis not present

## 2020-01-25 DIAGNOSIS — S82832D Other fracture of upper and lower end of left fibula, subsequent encounter for closed fracture with routine healing: Secondary | ICD-10-CM | POA: Diagnosis not present

## 2020-01-25 DIAGNOSIS — I482 Chronic atrial fibrillation, unspecified: Secondary | ICD-10-CM | POA: Diagnosis not present

## 2020-01-25 DIAGNOSIS — Z882 Allergy status to sulfonamides status: Secondary | ICD-10-CM | POA: Diagnosis not present

## 2020-01-25 DIAGNOSIS — Z85820 Personal history of malignant melanoma of skin: Secondary | ICD-10-CM | POA: Diagnosis not present

## 2020-01-25 DIAGNOSIS — Q828 Other specified congenital malformations of skin: Secondary | ICD-10-CM | POA: Diagnosis not present

## 2020-01-25 DIAGNOSIS — M1612 Unilateral primary osteoarthritis, left hip: Secondary | ICD-10-CM | POA: Diagnosis not present

## 2020-01-25 DIAGNOSIS — U071 COVID-19: Secondary | ICD-10-CM | POA: Diagnosis not present

## 2020-01-25 DIAGNOSIS — E2839 Other primary ovarian failure: Secondary | ICD-10-CM | POA: Diagnosis not present

## 2020-01-25 DIAGNOSIS — M25511 Pain in right shoulder: Secondary | ICD-10-CM | POA: Diagnosis not present

## 2020-01-25 DIAGNOSIS — I9589 Other hypotension: Secondary | ICD-10-CM | POA: Diagnosis not present

## 2020-01-25 DIAGNOSIS — J939 Pneumothorax, unspecified: Secondary | ICD-10-CM | POA: Diagnosis not present

## 2020-01-25 DIAGNOSIS — M5440 Lumbago with sciatica, unspecified side: Secondary | ICD-10-CM | POA: Diagnosis not present

## 2020-01-25 DIAGNOSIS — D509 Iron deficiency anemia, unspecified: Secondary | ICD-10-CM | POA: Diagnosis not present

## 2020-01-25 DIAGNOSIS — Z1331 Encounter for screening for depression: Secondary | ICD-10-CM | POA: Diagnosis not present

## 2020-01-25 DIAGNOSIS — E041 Nontoxic single thyroid nodule: Secondary | ICD-10-CM | POA: Diagnosis not present

## 2020-01-25 DIAGNOSIS — R220 Localized swelling, mass and lump, head: Secondary | ICD-10-CM | POA: Diagnosis not present

## 2020-01-25 DIAGNOSIS — M810 Age-related osteoporosis without current pathological fracture: Secondary | ICD-10-CM | POA: Diagnosis not present

## 2020-01-25 DIAGNOSIS — F325 Major depressive disorder, single episode, in full remission: Secondary | ICD-10-CM | POA: Diagnosis not present

## 2020-01-25 DIAGNOSIS — Z125 Encounter for screening for malignant neoplasm of prostate: Secondary | ICD-10-CM | POA: Diagnosis not present

## 2020-01-25 DIAGNOSIS — E782 Mixed hyperlipidemia: Secondary | ICD-10-CM | POA: Diagnosis not present

## 2020-01-25 DIAGNOSIS — E1140 Type 2 diabetes mellitus with diabetic neuropathy, unspecified: Secondary | ICD-10-CM | POA: Diagnosis not present

## 2020-01-25 DIAGNOSIS — L989 Disorder of the skin and subcutaneous tissue, unspecified: Secondary | ICD-10-CM | POA: Diagnosis not present

## 2020-01-25 DIAGNOSIS — M2550 Pain in unspecified joint: Secondary | ICD-10-CM | POA: Diagnosis not present

## 2020-01-25 DIAGNOSIS — L309 Dermatitis, unspecified: Secondary | ICD-10-CM | POA: Diagnosis not present

## 2020-01-25 DIAGNOSIS — L820 Inflamed seborrheic keratosis: Secondary | ICD-10-CM | POA: Diagnosis not present

## 2020-01-25 DIAGNOSIS — K219 Gastro-esophageal reflux disease without esophagitis: Secondary | ICD-10-CM | POA: Diagnosis not present

## 2020-01-25 DIAGNOSIS — G8918 Other acute postprocedural pain: Secondary | ICD-10-CM | POA: Diagnosis not present

## 2020-01-25 DIAGNOSIS — M549 Dorsalgia, unspecified: Secondary | ICD-10-CM | POA: Diagnosis not present

## 2020-01-25 DIAGNOSIS — I25810 Atherosclerosis of coronary artery bypass graft(s) without angina pectoris: Secondary | ICD-10-CM | POA: Diagnosis not present

## 2020-01-25 DIAGNOSIS — E538 Deficiency of other specified B group vitamins: Secondary | ICD-10-CM | POA: Diagnosis not present

## 2020-01-25 DIAGNOSIS — J984 Other disorders of lung: Secondary | ICD-10-CM | POA: Diagnosis not present

## 2020-01-25 DIAGNOSIS — R5383 Other fatigue: Secondary | ICD-10-CM | POA: Diagnosis not present

## 2020-01-25 DIAGNOSIS — C801 Malignant (primary) neoplasm, unspecified: Secondary | ICD-10-CM | POA: Diagnosis not present

## 2020-01-25 DIAGNOSIS — J4520 Mild intermittent asthma, uncomplicated: Secondary | ICD-10-CM | POA: Diagnosis not present

## 2020-01-25 DIAGNOSIS — M4807 Spinal stenosis, lumbosacral region: Secondary | ICD-10-CM | POA: Diagnosis not present

## 2020-01-25 DIAGNOSIS — Z7951 Long term (current) use of inhaled steroids: Secondary | ICD-10-CM | POA: Diagnosis not present

## 2020-01-25 DIAGNOSIS — R531 Weakness: Secondary | ICD-10-CM | POA: Diagnosis not present

## 2020-01-25 DIAGNOSIS — J3481 Nasal mucositis (ulcerative): Secondary | ICD-10-CM | POA: Diagnosis not present

## 2020-01-25 DIAGNOSIS — S46011A Strain of muscle(s) and tendon(s) of the rotator cuff of right shoulder, initial encounter: Secondary | ICD-10-CM | POA: Diagnosis not present

## 2020-01-25 DIAGNOSIS — J301 Allergic rhinitis due to pollen: Secondary | ICD-10-CM | POA: Diagnosis not present

## 2020-01-25 DIAGNOSIS — I272 Pulmonary hypertension, unspecified: Secondary | ICD-10-CM | POA: Diagnosis not present

## 2020-01-25 DIAGNOSIS — H35372 Puckering of macula, left eye: Secondary | ICD-10-CM | POA: Diagnosis not present

## 2020-01-25 DIAGNOSIS — G959 Disease of spinal cord, unspecified: Secondary | ICD-10-CM | POA: Diagnosis not present

## 2020-01-25 DIAGNOSIS — M9973 Connective tissue and disc stenosis of intervertebral foramina of lumbar region: Secondary | ICD-10-CM | POA: Diagnosis not present

## 2020-01-25 DIAGNOSIS — M19042 Primary osteoarthritis, left hand: Secondary | ICD-10-CM | POA: Diagnosis not present

## 2020-01-25 DIAGNOSIS — L270 Generalized skin eruption due to drugs and medicaments taken internally: Secondary | ICD-10-CM | POA: Diagnosis not present

## 2020-01-25 DIAGNOSIS — D519 Vitamin B12 deficiency anemia, unspecified: Secondary | ICD-10-CM | POA: Diagnosis not present

## 2020-01-25 DIAGNOSIS — S72361D Displaced segmental fracture of shaft of right femur, subsequent encounter for closed fracture with routine healing: Secondary | ICD-10-CM | POA: Diagnosis not present

## 2020-01-25 DIAGNOSIS — M5137 Other intervertebral disc degeneration, lumbosacral region: Secondary | ICD-10-CM | POA: Diagnosis not present

## 2020-01-25 DIAGNOSIS — R0989 Other specified symptoms and signs involving the circulatory and respiratory systems: Secondary | ICD-10-CM | POA: Diagnosis not present

## 2020-01-25 DIAGNOSIS — R2681 Unsteadiness on feet: Secondary | ICD-10-CM | POA: Diagnosis not present

## 2020-01-25 DIAGNOSIS — N189 Chronic kidney disease, unspecified: Secondary | ICD-10-CM | POA: Diagnosis not present

## 2020-01-25 DIAGNOSIS — I420 Dilated cardiomyopathy: Secondary | ICD-10-CM | POA: Diagnosis not present

## 2020-01-25 DIAGNOSIS — E7800 Pure hypercholesterolemia, unspecified: Secondary | ICD-10-CM | POA: Diagnosis not present

## 2020-01-26 DIAGNOSIS — C37 Malignant neoplasm of thymus: Secondary | ICD-10-CM | POA: Diagnosis not present

## 2020-01-26 DIAGNOSIS — Z96641 Presence of right artificial hip joint: Secondary | ICD-10-CM | POA: Diagnosis not present

## 2020-01-26 DIAGNOSIS — I82409 Acute embolism and thrombosis of unspecified deep veins of unspecified lower extremity: Secondary | ICD-10-CM | POA: Diagnosis not present

## 2020-01-26 DIAGNOSIS — A498 Other bacterial infections of unspecified site: Secondary | ICD-10-CM | POA: Diagnosis not present

## 2020-01-26 DIAGNOSIS — M25561 Pain in right knee: Secondary | ICD-10-CM | POA: Diagnosis not present

## 2020-01-26 DIAGNOSIS — R7301 Impaired fasting glucose: Secondary | ICD-10-CM | POA: Diagnosis not present

## 2020-01-26 DIAGNOSIS — E1122 Type 2 diabetes mellitus with diabetic chronic kidney disease: Secondary | ICD-10-CM | POA: Diagnosis not present

## 2020-01-26 DIAGNOSIS — E7849 Other hyperlipidemia: Secondary | ICD-10-CM | POA: Diagnosis not present

## 2020-01-26 DIAGNOSIS — F0150 Vascular dementia without behavioral disturbance: Secondary | ICD-10-CM | POA: Diagnosis not present

## 2020-01-26 DIAGNOSIS — D62 Acute posthemorrhagic anemia: Secondary | ICD-10-CM | POA: Diagnosis not present

## 2020-01-26 DIAGNOSIS — S069X9A Unspecified intracranial injury with loss of consciousness of unspecified duration, initial encounter: Secondary | ICD-10-CM | POA: Diagnosis not present

## 2020-01-26 DIAGNOSIS — R946 Abnormal results of thyroid function studies: Secondary | ICD-10-CM | POA: Diagnosis not present

## 2020-01-26 DIAGNOSIS — M79676 Pain in unspecified toe(s): Secondary | ICD-10-CM | POA: Diagnosis not present

## 2020-01-26 DIAGNOSIS — C259 Malignant neoplasm of pancreas, unspecified: Secondary | ICD-10-CM | POA: Diagnosis not present

## 2020-01-26 DIAGNOSIS — K5720 Diverticulitis of large intestine with perforation and abscess without bleeding: Secondary | ICD-10-CM | POA: Diagnosis not present

## 2020-01-26 DIAGNOSIS — I959 Hypotension, unspecified: Secondary | ICD-10-CM | POA: Diagnosis not present

## 2020-01-26 DIAGNOSIS — I129 Hypertensive chronic kidney disease with stage 1 through stage 4 chronic kidney disease, or unspecified chronic kidney disease: Secondary | ICD-10-CM | POA: Diagnosis not present

## 2020-01-26 DIAGNOSIS — E559 Vitamin D deficiency, unspecified: Secondary | ICD-10-CM | POA: Diagnosis not present

## 2020-01-26 DIAGNOSIS — Z13820 Encounter for screening for osteoporosis: Secondary | ICD-10-CM | POA: Diagnosis not present

## 2020-01-26 DIAGNOSIS — F411 Generalized anxiety disorder: Secondary | ICD-10-CM | POA: Diagnosis not present

## 2020-01-26 DIAGNOSIS — E611 Iron deficiency: Secondary | ICD-10-CM | POA: Diagnosis not present

## 2020-01-26 DIAGNOSIS — G309 Alzheimer's disease, unspecified: Secondary | ICD-10-CM | POA: Diagnosis not present

## 2020-01-26 DIAGNOSIS — Z09 Encounter for follow-up examination after completed treatment for conditions other than malignant neoplasm: Secondary | ICD-10-CM | POA: Diagnosis not present

## 2020-01-26 DIAGNOSIS — Z96652 Presence of left artificial knee joint: Secondary | ICD-10-CM | POA: Diagnosis not present

## 2020-01-26 DIAGNOSIS — L97522 Non-pressure chronic ulcer of other part of left foot with fat layer exposed: Secondary | ICD-10-CM | POA: Diagnosis not present

## 2020-01-26 DIAGNOSIS — D631 Anemia in chronic kidney disease: Secondary | ICD-10-CM | POA: Diagnosis not present

## 2020-01-26 DIAGNOSIS — M205X9 Other deformities of toe(s) (acquired), unspecified foot: Secondary | ICD-10-CM | POA: Diagnosis not present

## 2020-01-26 DIAGNOSIS — I519 Heart disease, unspecified: Secondary | ICD-10-CM | POA: Diagnosis not present

## 2020-01-26 DIAGNOSIS — M353 Polymyalgia rheumatica: Secondary | ICD-10-CM | POA: Diagnosis not present

## 2020-01-26 DIAGNOSIS — R410 Disorientation, unspecified: Secondary | ICD-10-CM | POA: Diagnosis not present

## 2020-01-26 DIAGNOSIS — Z800 Family history of malignant neoplasm of digestive organs: Secondary | ICD-10-CM | POA: Diagnosis not present

## 2020-01-26 DIAGNOSIS — K648 Other hemorrhoids: Secondary | ICD-10-CM | POA: Diagnosis not present

## 2020-01-26 DIAGNOSIS — N186 End stage renal disease: Secondary | ICD-10-CM | POA: Diagnosis not present

## 2020-01-26 DIAGNOSIS — N184 Chronic kidney disease, stage 4 (severe): Secondary | ICD-10-CM | POA: Diagnosis not present

## 2020-01-26 DIAGNOSIS — I6302 Cerebral infarction due to thrombosis of basilar artery: Secondary | ICD-10-CM | POA: Diagnosis not present

## 2020-01-26 DIAGNOSIS — Z7982 Long term (current) use of aspirin: Secondary | ICD-10-CM | POA: Diagnosis not present

## 2020-01-26 DIAGNOSIS — H25812 Combined forms of age-related cataract, left eye: Secondary | ICD-10-CM | POA: Diagnosis not present

## 2020-01-26 DIAGNOSIS — Z48812 Encounter for surgical aftercare following surgery on the circulatory system: Secondary | ICD-10-CM | POA: Diagnosis not present

## 2020-01-26 DIAGNOSIS — M1990 Unspecified osteoarthritis, unspecified site: Secondary | ICD-10-CM | POA: Diagnosis not present

## 2020-01-26 DIAGNOSIS — N3281 Overactive bladder: Secondary | ICD-10-CM | POA: Diagnosis not present

## 2020-01-26 DIAGNOSIS — N3000 Acute cystitis without hematuria: Secondary | ICD-10-CM | POA: Diagnosis not present

## 2020-01-26 DIAGNOSIS — S52032E Displaced fracture of olecranon process with intraarticular extension of left ulna, subsequent encounter for open fracture type I or II with routine healing: Secondary | ICD-10-CM | POA: Diagnosis not present

## 2020-01-26 DIAGNOSIS — K8020 Calculus of gallbladder without cholecystitis without obstruction: Secondary | ICD-10-CM | POA: Diagnosis not present

## 2020-01-26 DIAGNOSIS — Z7901 Long term (current) use of anticoagulants: Secondary | ICD-10-CM | POA: Diagnosis not present

## 2020-01-26 DIAGNOSIS — D801 Nonfamilial hypogammaglobulinemia: Secondary | ICD-10-CM | POA: Diagnosis not present

## 2020-01-26 DIAGNOSIS — F324 Major depressive disorder, single episode, in partial remission: Secondary | ICD-10-CM | POA: Diagnosis not present

## 2020-01-26 DIAGNOSIS — R29898 Other symptoms and signs involving the musculoskeletal system: Secondary | ICD-10-CM | POA: Diagnosis not present

## 2020-01-26 DIAGNOSIS — I5032 Chronic diastolic (congestive) heart failure: Secondary | ICD-10-CM | POA: Diagnosis not present

## 2020-01-26 DIAGNOSIS — E1165 Type 2 diabetes mellitus with hyperglycemia: Secondary | ICD-10-CM | POA: Diagnosis not present

## 2020-01-26 DIAGNOSIS — H2511 Age-related nuclear cataract, right eye: Secondary | ICD-10-CM | POA: Diagnosis not present

## 2020-01-26 DIAGNOSIS — N189 Chronic kidney disease, unspecified: Secondary | ICD-10-CM | POA: Diagnosis not present

## 2020-01-26 DIAGNOSIS — M25551 Pain in right hip: Secondary | ICD-10-CM | POA: Diagnosis not present

## 2020-01-26 DIAGNOSIS — K219 Gastro-esophageal reflux disease without esophagitis: Secondary | ICD-10-CM | POA: Diagnosis not present

## 2020-01-26 DIAGNOSIS — Z20822 Contact with and (suspected) exposure to covid-19: Secondary | ICD-10-CM | POA: Diagnosis not present

## 2020-01-26 DIAGNOSIS — I447 Left bundle-branch block, unspecified: Secondary | ICD-10-CM | POA: Diagnosis not present

## 2020-01-26 DIAGNOSIS — Z8673 Personal history of transient ischemic attack (TIA), and cerebral infarction without residual deficits: Secondary | ICD-10-CM | POA: Diagnosis not present

## 2020-01-26 DIAGNOSIS — J439 Emphysema, unspecified: Secondary | ICD-10-CM | POA: Diagnosis not present

## 2020-01-26 DIAGNOSIS — N2889 Other specified disorders of kidney and ureter: Secondary | ICD-10-CM | POA: Diagnosis not present

## 2020-01-26 DIAGNOSIS — F0280 Dementia in other diseases classified elsewhere without behavioral disturbance: Secondary | ICD-10-CM | POA: Diagnosis not present

## 2020-01-26 DIAGNOSIS — Z8719 Personal history of other diseases of the digestive system: Secondary | ICD-10-CM | POA: Diagnosis not present

## 2020-01-26 DIAGNOSIS — G459 Transient cerebral ischemic attack, unspecified: Secondary | ICD-10-CM | POA: Diagnosis not present

## 2020-01-26 DIAGNOSIS — F0390 Unspecified dementia without behavioral disturbance: Secondary | ICD-10-CM | POA: Diagnosis not present

## 2020-01-26 DIAGNOSIS — Z96653 Presence of artificial knee joint, bilateral: Secondary | ICD-10-CM | POA: Diagnosis not present

## 2020-01-26 DIAGNOSIS — I252 Old myocardial infarction: Secondary | ICD-10-CM | POA: Diagnosis not present

## 2020-01-26 DIAGNOSIS — Z03818 Encounter for observation for suspected exposure to other biological agents ruled out: Secondary | ICD-10-CM | POA: Diagnosis not present

## 2020-01-26 DIAGNOSIS — L570 Actinic keratosis: Secondary | ICD-10-CM | POA: Diagnosis not present

## 2020-01-26 DIAGNOSIS — J4520 Mild intermittent asthma, uncomplicated: Secondary | ICD-10-CM | POA: Diagnosis not present

## 2020-01-26 DIAGNOSIS — F322 Major depressive disorder, single episode, severe without psychotic features: Secondary | ICD-10-CM | POA: Diagnosis not present

## 2020-01-26 DIAGNOSIS — Z0000 Encounter for general adult medical examination without abnormal findings: Secondary | ICD-10-CM | POA: Diagnosis not present

## 2020-01-26 DIAGNOSIS — G4733 Obstructive sleep apnea (adult) (pediatric): Secondary | ICD-10-CM | POA: Diagnosis not present

## 2020-01-26 DIAGNOSIS — M25541 Pain in joints of right hand: Secondary | ICD-10-CM | POA: Diagnosis not present

## 2020-01-26 DIAGNOSIS — K5792 Diverticulitis of intestine, part unspecified, without perforation or abscess without bleeding: Secondary | ICD-10-CM | POA: Diagnosis not present

## 2020-01-26 DIAGNOSIS — M8588 Other specified disorders of bone density and structure, other site: Secondary | ICD-10-CM | POA: Diagnosis not present

## 2020-01-26 DIAGNOSIS — R279 Unspecified lack of coordination: Secondary | ICD-10-CM | POA: Diagnosis not present

## 2020-01-26 DIAGNOSIS — M179 Osteoarthritis of knee, unspecified: Secondary | ICD-10-CM | POA: Diagnosis not present

## 2020-01-26 DIAGNOSIS — D509 Iron deficiency anemia, unspecified: Secondary | ICD-10-CM | POA: Diagnosis not present

## 2020-01-26 DIAGNOSIS — J441 Chronic obstructive pulmonary disease with (acute) exacerbation: Secondary | ICD-10-CM | POA: Diagnosis not present

## 2020-01-26 DIAGNOSIS — B351 Tinea unguium: Secondary | ICD-10-CM | POA: Diagnosis not present

## 2020-01-26 DIAGNOSIS — Z87891 Personal history of nicotine dependence: Secondary | ICD-10-CM | POA: Diagnosis not present

## 2020-01-26 DIAGNOSIS — M79674 Pain in right toe(s): Secondary | ICD-10-CM | POA: Diagnosis not present

## 2020-01-26 DIAGNOSIS — E119 Type 2 diabetes mellitus without complications: Secondary | ICD-10-CM | POA: Diagnosis not present

## 2020-01-26 DIAGNOSIS — M25512 Pain in left shoulder: Secondary | ICD-10-CM | POA: Diagnosis not present

## 2020-01-26 DIAGNOSIS — F330 Major depressive disorder, recurrent, mild: Secondary | ICD-10-CM | POA: Diagnosis not present

## 2020-01-26 DIAGNOSIS — G255 Other chorea: Secondary | ICD-10-CM | POA: Diagnosis not present

## 2020-01-26 DIAGNOSIS — C7951 Secondary malignant neoplasm of bone: Secondary | ICD-10-CM | POA: Diagnosis not present

## 2020-01-26 DIAGNOSIS — Z85820 Personal history of malignant melanoma of skin: Secondary | ICD-10-CM | POA: Diagnosis not present

## 2020-01-26 DIAGNOSIS — I701 Atherosclerosis of renal artery: Secondary | ICD-10-CM | POA: Diagnosis not present

## 2020-01-26 DIAGNOSIS — Z955 Presence of coronary angioplasty implant and graft: Secondary | ICD-10-CM | POA: Diagnosis not present

## 2020-01-26 DIAGNOSIS — L97521 Non-pressure chronic ulcer of other part of left foot limited to breakdown of skin: Secondary | ICD-10-CM | POA: Diagnosis not present

## 2020-01-26 DIAGNOSIS — I6521 Occlusion and stenosis of right carotid artery: Secondary | ICD-10-CM | POA: Diagnosis not present

## 2020-01-26 DIAGNOSIS — R4020 Unspecified coma: Secondary | ICD-10-CM | POA: Diagnosis not present

## 2020-01-26 DIAGNOSIS — I872 Venous insufficiency (chronic) (peripheral): Secondary | ICD-10-CM | POA: Diagnosis not present

## 2020-01-26 DIAGNOSIS — J189 Pneumonia, unspecified organism: Secondary | ICD-10-CM | POA: Diagnosis not present

## 2020-01-26 DIAGNOSIS — D500 Iron deficiency anemia secondary to blood loss (chronic): Secondary | ICD-10-CM | POA: Diagnosis not present

## 2020-01-26 DIAGNOSIS — J449 Chronic obstructive pulmonary disease, unspecified: Secondary | ICD-10-CM | POA: Diagnosis not present

## 2020-01-26 DIAGNOSIS — E7800 Pure hypercholesterolemia, unspecified: Secondary | ICD-10-CM | POA: Diagnosis not present

## 2020-01-26 DIAGNOSIS — L578 Other skin changes due to chronic exposure to nonionizing radiation: Secondary | ICD-10-CM | POA: Diagnosis not present

## 2020-01-26 DIAGNOSIS — M12811 Other specific arthropathies, not elsewhere classified, right shoulder: Secondary | ICD-10-CM | POA: Diagnosis not present

## 2020-01-26 DIAGNOSIS — R1032 Left lower quadrant pain: Secondary | ICD-10-CM | POA: Diagnosis not present

## 2020-01-26 DIAGNOSIS — M8580 Other specified disorders of bone density and structure, unspecified site: Secondary | ICD-10-CM | POA: Diagnosis not present

## 2020-01-26 DIAGNOSIS — Z9181 History of falling: Secondary | ICD-10-CM | POA: Diagnosis not present

## 2020-01-26 DIAGNOSIS — E1143 Type 2 diabetes mellitus with diabetic autonomic (poly)neuropathy: Secondary | ICD-10-CM | POA: Diagnosis not present

## 2020-01-26 DIAGNOSIS — Z01810 Encounter for preprocedural cardiovascular examination: Secondary | ICD-10-CM | POA: Diagnosis not present

## 2020-01-26 DIAGNOSIS — E876 Hypokalemia: Secondary | ICD-10-CM | POA: Diagnosis not present

## 2020-01-26 DIAGNOSIS — M5136 Other intervertebral disc degeneration, lumbar region: Secondary | ICD-10-CM | POA: Diagnosis not present

## 2020-01-26 DIAGNOSIS — F419 Anxiety disorder, unspecified: Secondary | ICD-10-CM | POA: Diagnosis not present

## 2020-01-26 DIAGNOSIS — M7061 Trochanteric bursitis, right hip: Secondary | ICD-10-CM | POA: Diagnosis not present

## 2020-01-26 DIAGNOSIS — E875 Hyperkalemia: Secondary | ICD-10-CM | POA: Diagnosis not present

## 2020-01-26 DIAGNOSIS — K922 Gastrointestinal hemorrhage, unspecified: Secondary | ICD-10-CM | POA: Diagnosis not present

## 2020-01-26 DIAGNOSIS — E041 Nontoxic single thyroid nodule: Secondary | ICD-10-CM | POA: Diagnosis not present

## 2020-01-26 DIAGNOSIS — M7062 Trochanteric bursitis, left hip: Secondary | ICD-10-CM | POA: Diagnosis not present

## 2020-01-26 DIAGNOSIS — J9601 Acute respiratory failure with hypoxia: Secondary | ICD-10-CM | POA: Diagnosis not present

## 2020-01-26 DIAGNOSIS — N131 Hydronephrosis with ureteral stricture, not elsewhere classified: Secondary | ICD-10-CM | POA: Diagnosis not present

## 2020-01-26 DIAGNOSIS — L84 Corns and callosities: Secondary | ICD-10-CM | POA: Diagnosis not present

## 2020-01-26 DIAGNOSIS — R41841 Cognitive communication deficit: Secondary | ICD-10-CM | POA: Diagnosis not present

## 2020-01-26 DIAGNOSIS — Z85828 Personal history of other malignant neoplasm of skin: Secondary | ICD-10-CM | POA: Diagnosis not present

## 2020-01-26 DIAGNOSIS — F329 Major depressive disorder, single episode, unspecified: Secondary | ICD-10-CM | POA: Diagnosis not present

## 2020-01-26 DIAGNOSIS — F4321 Adjustment disorder with depressed mood: Secondary | ICD-10-CM | POA: Diagnosis not present

## 2020-01-26 DIAGNOSIS — N3941 Urge incontinence: Secondary | ICD-10-CM | POA: Diagnosis not present

## 2020-01-26 DIAGNOSIS — R404 Transient alteration of awareness: Secondary | ICD-10-CM | POA: Diagnosis not present

## 2020-01-26 DIAGNOSIS — Z510 Encounter for antineoplastic radiation therapy: Secondary | ICD-10-CM | POA: Diagnosis not present

## 2020-01-26 DIAGNOSIS — Z1211 Encounter for screening for malignant neoplasm of colon: Secondary | ICD-10-CM | POA: Diagnosis not present

## 2020-01-26 DIAGNOSIS — R5382 Chronic fatigue, unspecified: Secondary | ICD-10-CM | POA: Diagnosis not present

## 2020-01-26 DIAGNOSIS — M1711 Unilateral primary osteoarthritis, right knee: Secondary | ICD-10-CM | POA: Diagnosis not present

## 2020-01-26 DIAGNOSIS — R0600 Dyspnea, unspecified: Secondary | ICD-10-CM | POA: Diagnosis not present

## 2020-01-26 DIAGNOSIS — R339 Retention of urine, unspecified: Secondary | ICD-10-CM | POA: Diagnosis not present

## 2020-01-26 DIAGNOSIS — E1121 Type 2 diabetes mellitus with diabetic nephropathy: Secondary | ICD-10-CM | POA: Diagnosis not present

## 2020-01-26 DIAGNOSIS — Z20828 Contact with and (suspected) exposure to other viral communicable diseases: Secondary | ICD-10-CM | POA: Diagnosis not present

## 2020-01-26 DIAGNOSIS — K635 Polyp of colon: Secondary | ICD-10-CM | POA: Diagnosis not present

## 2020-01-26 DIAGNOSIS — M79645 Pain in left finger(s): Secondary | ICD-10-CM | POA: Diagnosis not present

## 2020-01-26 DIAGNOSIS — E782 Mixed hyperlipidemia: Secondary | ICD-10-CM | POA: Diagnosis not present

## 2020-01-26 DIAGNOSIS — J309 Allergic rhinitis, unspecified: Secondary | ICD-10-CM | POA: Diagnosis not present

## 2020-01-26 DIAGNOSIS — R338 Other retention of urine: Secondary | ICD-10-CM | POA: Diagnosis not present

## 2020-01-26 DIAGNOSIS — Z1389 Encounter for screening for other disorder: Secondary | ICD-10-CM | POA: Diagnosis not present

## 2020-01-26 DIAGNOSIS — R0902 Hypoxemia: Secondary | ICD-10-CM | POA: Diagnosis not present

## 2020-01-26 DIAGNOSIS — M545 Low back pain: Secondary | ICD-10-CM | POA: Diagnosis not present

## 2020-01-26 DIAGNOSIS — I4891 Unspecified atrial fibrillation: Secondary | ICD-10-CM | POA: Diagnosis not present

## 2020-01-26 DIAGNOSIS — M546 Pain in thoracic spine: Secondary | ICD-10-CM | POA: Diagnosis not present

## 2020-01-26 DIAGNOSIS — D0512 Intraductal carcinoma in situ of left breast: Secondary | ICD-10-CM | POA: Diagnosis not present

## 2020-01-26 DIAGNOSIS — N390 Urinary tract infection, site not specified: Secondary | ICD-10-CM | POA: Diagnosis not present

## 2020-01-26 DIAGNOSIS — S43491D Other sprain of right shoulder joint, subsequent encounter: Secondary | ICD-10-CM | POA: Diagnosis not present

## 2020-01-26 DIAGNOSIS — F319 Bipolar disorder, unspecified: Secondary | ICD-10-CM | POA: Diagnosis not present

## 2020-01-26 DIAGNOSIS — S82261F Displaced segmental fracture of shaft of right tibia, subsequent encounter for open fracture type IIIA, IIIB, or IIIC with routine healing: Secondary | ICD-10-CM | POA: Diagnosis not present

## 2020-01-26 DIAGNOSIS — M6281 Muscle weakness (generalized): Secondary | ICD-10-CM | POA: Diagnosis not present

## 2020-01-26 DIAGNOSIS — M12812 Other specific arthropathies, not elsewhere classified, left shoulder: Secondary | ICD-10-CM | POA: Diagnosis not present

## 2020-01-26 DIAGNOSIS — Z8546 Personal history of malignant neoplasm of prostate: Secondary | ICD-10-CM | POA: Diagnosis not present

## 2020-01-26 DIAGNOSIS — Z1159 Encounter for screening for other viral diseases: Secondary | ICD-10-CM | POA: Diagnosis not present

## 2020-01-26 DIAGNOSIS — I10 Essential (primary) hypertension: Secondary | ICD-10-CM | POA: Diagnosis not present

## 2020-01-26 DIAGNOSIS — G894 Chronic pain syndrome: Secondary | ICD-10-CM | POA: Diagnosis not present

## 2020-01-26 DIAGNOSIS — C9000 Multiple myeloma not having achieved remission: Secondary | ICD-10-CM | POA: Diagnosis not present

## 2020-01-26 DIAGNOSIS — U071 COVID-19: Secondary | ICD-10-CM | POA: Diagnosis not present

## 2020-01-26 DIAGNOSIS — I712 Thoracic aortic aneurysm, without rupture: Secondary | ICD-10-CM | POA: Diagnosis not present

## 2020-01-26 DIAGNOSIS — R05 Cough: Secondary | ICD-10-CM | POA: Diagnosis not present

## 2020-01-26 DIAGNOSIS — E039 Hypothyroidism, unspecified: Secondary | ICD-10-CM | POA: Diagnosis not present

## 2020-01-26 DIAGNOSIS — E871 Hypo-osmolality and hyponatremia: Secondary | ICD-10-CM | POA: Diagnosis not present

## 2020-01-26 DIAGNOSIS — J479 Bronchiectasis, uncomplicated: Secondary | ICD-10-CM | POA: Diagnosis not present

## 2020-01-26 DIAGNOSIS — M25562 Pain in left knee: Secondary | ICD-10-CM | POA: Diagnosis not present

## 2020-01-26 DIAGNOSIS — G629 Polyneuropathy, unspecified: Secondary | ICD-10-CM | POA: Diagnosis not present

## 2020-01-26 DIAGNOSIS — Z87440 Personal history of urinary (tract) infections: Secondary | ICD-10-CM | POA: Diagnosis not present

## 2020-01-26 DIAGNOSIS — C44629 Squamous cell carcinoma of skin of left upper limb, including shoulder: Secondary | ICD-10-CM | POA: Diagnosis not present

## 2020-01-26 DIAGNOSIS — Z6830 Body mass index (BMI) 30.0-30.9, adult: Secondary | ICD-10-CM | POA: Diagnosis not present

## 2020-01-26 DIAGNOSIS — Z86711 Personal history of pulmonary embolism: Secondary | ICD-10-CM | POA: Diagnosis not present

## 2020-01-26 DIAGNOSIS — K5909 Other constipation: Secondary | ICD-10-CM | POA: Diagnosis not present

## 2020-01-26 DIAGNOSIS — J019 Acute sinusitis, unspecified: Secondary | ICD-10-CM | POA: Diagnosis not present

## 2020-01-26 DIAGNOSIS — R42 Dizziness and giddiness: Secondary | ICD-10-CM | POA: Diagnosis not present

## 2020-01-26 DIAGNOSIS — J849 Interstitial pulmonary disease, unspecified: Secondary | ICD-10-CM | POA: Diagnosis not present

## 2020-01-26 DIAGNOSIS — L720 Epidermal cyst: Secondary | ICD-10-CM | POA: Diagnosis not present

## 2020-01-26 DIAGNOSIS — H2512 Age-related nuclear cataract, left eye: Secondary | ICD-10-CM | POA: Diagnosis not present

## 2020-01-26 DIAGNOSIS — R0602 Shortness of breath: Secondary | ICD-10-CM | POA: Diagnosis not present

## 2020-01-26 DIAGNOSIS — M25511 Pain in right shoulder: Secondary | ICD-10-CM | POA: Diagnosis not present

## 2020-01-26 DIAGNOSIS — I69354 Hemiplegia and hemiparesis following cerebral infarction affecting left non-dominant side: Secondary | ICD-10-CM | POA: Diagnosis not present

## 2020-01-26 DIAGNOSIS — E1140 Type 2 diabetes mellitus with diabetic neuropathy, unspecified: Secondary | ICD-10-CM | POA: Diagnosis not present

## 2020-01-26 DIAGNOSIS — E11621 Type 2 diabetes mellitus with foot ulcer: Secondary | ICD-10-CM | POA: Diagnosis not present

## 2020-01-26 DIAGNOSIS — R1312 Dysphagia, oropharyngeal phase: Secondary | ICD-10-CM | POA: Diagnosis not present

## 2020-01-26 DIAGNOSIS — J45909 Unspecified asthma, uncomplicated: Secondary | ICD-10-CM | POA: Diagnosis not present

## 2020-01-26 DIAGNOSIS — M86262 Subacute osteomyelitis, left tibia and fibula: Secondary | ICD-10-CM | POA: Diagnosis not present

## 2020-01-26 DIAGNOSIS — J810 Acute pulmonary edema: Secondary | ICD-10-CM | POA: Diagnosis not present

## 2020-01-26 DIAGNOSIS — F39 Unspecified mood [affective] disorder: Secondary | ICD-10-CM | POA: Diagnosis not present

## 2020-01-26 DIAGNOSIS — C50511 Malignant neoplasm of lower-outer quadrant of right female breast: Secondary | ICD-10-CM | POA: Diagnosis not present

## 2020-01-26 DIAGNOSIS — Z7689 Persons encountering health services in other specified circumstances: Secondary | ICD-10-CM | POA: Diagnosis not present

## 2020-01-26 DIAGNOSIS — E1100 Type 2 diabetes mellitus with hyperosmolarity without nonketotic hyperglycemic-hyperosmolar coma (NKHHC): Secondary | ICD-10-CM | POA: Diagnosis not present

## 2020-01-26 DIAGNOSIS — Z7902 Long term (current) use of antithrombotics/antiplatelets: Secondary | ICD-10-CM | POA: Diagnosis not present

## 2020-01-26 DIAGNOSIS — Z794 Long term (current) use of insulin: Secondary | ICD-10-CM | POA: Diagnosis not present

## 2020-01-26 DIAGNOSIS — K5090 Crohn's disease, unspecified, without complications: Secondary | ICD-10-CM | POA: Diagnosis not present

## 2020-01-26 DIAGNOSIS — R601 Generalized edema: Secondary | ICD-10-CM | POA: Diagnosis not present

## 2020-01-26 DIAGNOSIS — M80051D Age-related osteoporosis with current pathological fracture, right femur, subsequent encounter for fracture with routine healing: Secondary | ICD-10-CM | POA: Diagnosis not present

## 2020-01-26 DIAGNOSIS — Z7401 Bed confinement status: Secondary | ICD-10-CM | POA: Diagnosis not present

## 2020-01-26 DIAGNOSIS — Z1231 Encounter for screening mammogram for malignant neoplasm of breast: Secondary | ICD-10-CM | POA: Diagnosis not present

## 2020-01-26 DIAGNOSIS — R2689 Other abnormalities of gait and mobility: Secondary | ICD-10-CM | POA: Diagnosis not present

## 2020-01-26 DIAGNOSIS — I5022 Chronic systolic (congestive) heart failure: Secondary | ICD-10-CM | POA: Diagnosis not present

## 2020-01-26 DIAGNOSIS — J9621 Acute and chronic respiratory failure with hypoxia: Secondary | ICD-10-CM | POA: Diagnosis not present

## 2020-01-26 DIAGNOSIS — I5030 Unspecified diastolic (congestive) heart failure: Secondary | ICD-10-CM | POA: Diagnosis not present

## 2020-01-26 DIAGNOSIS — N401 Enlarged prostate with lower urinary tract symptoms: Secondary | ICD-10-CM | POA: Diagnosis not present

## 2020-01-26 DIAGNOSIS — C61 Malignant neoplasm of prostate: Secondary | ICD-10-CM | POA: Diagnosis not present

## 2020-01-26 DIAGNOSIS — F331 Major depressive disorder, recurrent, moderate: Secondary | ICD-10-CM | POA: Diagnosis not present

## 2020-01-26 DIAGNOSIS — R5383 Other fatigue: Secondary | ICD-10-CM | POA: Diagnosis not present

## 2020-01-26 DIAGNOSIS — M7502 Adhesive capsulitis of left shoulder: Secondary | ICD-10-CM | POA: Diagnosis not present

## 2020-01-26 DIAGNOSIS — A419 Sepsis, unspecified organism: Secondary | ICD-10-CM | POA: Diagnosis not present

## 2020-01-26 DIAGNOSIS — M1611 Unilateral primary osteoarthritis, right hip: Secondary | ICD-10-CM | POA: Diagnosis not present

## 2020-01-26 DIAGNOSIS — R002 Palpitations: Secondary | ICD-10-CM | POA: Diagnosis not present

## 2020-01-26 DIAGNOSIS — R531 Weakness: Secondary | ICD-10-CM | POA: Diagnosis not present

## 2020-01-26 DIAGNOSIS — D329 Benign neoplasm of meninges, unspecified: Secondary | ICD-10-CM | POA: Diagnosis not present

## 2020-01-26 DIAGNOSIS — M25552 Pain in left hip: Secondary | ICD-10-CM | POA: Diagnosis not present

## 2020-01-26 DIAGNOSIS — M79675 Pain in left toe(s): Secondary | ICD-10-CM | POA: Diagnosis not present

## 2020-01-26 DIAGNOSIS — I700 Atherosclerosis of aorta: Secondary | ICD-10-CM | POA: Diagnosis not present

## 2020-01-26 DIAGNOSIS — E1159 Type 2 diabetes mellitus with other circulatory complications: Secondary | ICD-10-CM | POA: Diagnosis not present

## 2020-01-26 DIAGNOSIS — I50810 Right heart failure, unspecified: Secondary | ICD-10-CM | POA: Diagnosis not present

## 2020-01-26 DIAGNOSIS — M810 Age-related osteoporosis without current pathological fracture: Secondary | ICD-10-CM | POA: Diagnosis not present

## 2020-01-26 DIAGNOSIS — M47816 Spondylosis without myelopathy or radiculopathy, lumbar region: Secondary | ICD-10-CM | POA: Diagnosis not present

## 2020-01-26 DIAGNOSIS — S82261K Displaced segmental fracture of shaft of right tibia, subsequent encounter for closed fracture with nonunion: Secondary | ICD-10-CM | POA: Diagnosis not present

## 2020-01-26 DIAGNOSIS — I5043 Acute on chronic combined systolic (congestive) and diastolic (congestive) heart failure: Secondary | ICD-10-CM | POA: Diagnosis not present

## 2020-01-26 DIAGNOSIS — M25662 Stiffness of left knee, not elsewhere classified: Secondary | ICD-10-CM | POA: Diagnosis not present

## 2020-01-26 DIAGNOSIS — C182 Malignant neoplasm of ascending colon: Secondary | ICD-10-CM | POA: Diagnosis not present

## 2020-01-26 DIAGNOSIS — Z01812 Encounter for preprocedural laboratory examination: Secondary | ICD-10-CM | POA: Diagnosis not present

## 2020-01-26 DIAGNOSIS — D649 Anemia, unspecified: Secondary | ICD-10-CM | POA: Diagnosis not present

## 2020-01-26 DIAGNOSIS — Z79899 Other long term (current) drug therapy: Secondary | ICD-10-CM | POA: Diagnosis not present

## 2020-01-26 DIAGNOSIS — Z90710 Acquired absence of both cervix and uterus: Secondary | ICD-10-CM | POA: Diagnosis not present

## 2020-01-26 DIAGNOSIS — D61818 Other pancytopenia: Secondary | ICD-10-CM | POA: Diagnosis not present

## 2020-01-26 DIAGNOSIS — H409 Unspecified glaucoma: Secondary | ICD-10-CM | POA: Diagnosis not present

## 2020-01-26 DIAGNOSIS — I739 Peripheral vascular disease, unspecified: Secondary | ICD-10-CM | POA: Diagnosis not present

## 2020-01-26 DIAGNOSIS — G8929 Other chronic pain: Secondary | ICD-10-CM | POA: Diagnosis not present

## 2020-01-26 DIAGNOSIS — Z992 Dependence on renal dialysis: Secondary | ICD-10-CM | POA: Diagnosis not present

## 2020-01-26 DIAGNOSIS — R000 Tachycardia, unspecified: Secondary | ICD-10-CM | POA: Diagnosis not present

## 2020-01-26 DIAGNOSIS — R6889 Other general symptoms and signs: Secondary | ICD-10-CM | POA: Diagnosis not present

## 2020-01-26 DIAGNOSIS — G4700 Insomnia, unspecified: Secondary | ICD-10-CM | POA: Diagnosis not present

## 2020-01-26 DIAGNOSIS — E669 Obesity, unspecified: Secondary | ICD-10-CM | POA: Diagnosis not present

## 2020-01-26 DIAGNOSIS — F1120 Opioid dependence, uncomplicated: Secondary | ICD-10-CM | POA: Diagnosis not present

## 2020-01-26 DIAGNOSIS — I2510 Atherosclerotic heart disease of native coronary artery without angina pectoris: Secondary | ICD-10-CM | POA: Diagnosis not present

## 2020-01-26 DIAGNOSIS — M19012 Primary osteoarthritis, left shoulder: Secondary | ICD-10-CM | POA: Diagnosis not present

## 2020-01-26 DIAGNOSIS — M109 Gout, unspecified: Secondary | ICD-10-CM | POA: Diagnosis not present

## 2020-01-26 DIAGNOSIS — M25542 Pain in joints of left hand: Secondary | ICD-10-CM | POA: Diagnosis not present

## 2020-01-26 DIAGNOSIS — I130 Hypertensive heart and chronic kidney disease with heart failure and stage 1 through stage 4 chronic kidney disease, or unspecified chronic kidney disease: Secondary | ICD-10-CM | POA: Diagnosis not present

## 2020-01-26 DIAGNOSIS — I110 Hypertensive heart disease with heart failure: Secondary | ICD-10-CM | POA: Diagnosis not present

## 2020-01-26 DIAGNOSIS — M65312 Trigger thumb, left thumb: Secondary | ICD-10-CM | POA: Diagnosis not present

## 2020-01-26 DIAGNOSIS — R519 Headache, unspecified: Secondary | ICD-10-CM | POA: Diagnosis not present

## 2020-01-26 DIAGNOSIS — R030 Elevated blood-pressure reading, without diagnosis of hypertension: Secondary | ICD-10-CM | POA: Diagnosis not present

## 2020-01-26 DIAGNOSIS — D518 Other vitamin B12 deficiency anemias: Secondary | ICD-10-CM | POA: Diagnosis not present

## 2020-01-26 DIAGNOSIS — E7089 Other disorders of aromatic amino-acid metabolism: Secondary | ICD-10-CM | POA: Diagnosis not present

## 2020-01-26 DIAGNOSIS — J9602 Acute respiratory failure with hypercapnia: Secondary | ICD-10-CM | POA: Diagnosis not present

## 2020-01-26 DIAGNOSIS — D6859 Other primary thrombophilia: Secondary | ICD-10-CM | POA: Diagnosis not present

## 2020-01-26 DIAGNOSIS — E785 Hyperlipidemia, unspecified: Secondary | ICD-10-CM | POA: Diagnosis not present

## 2020-01-26 DIAGNOSIS — N183 Chronic kidney disease, stage 3 unspecified: Secondary | ICD-10-CM | POA: Diagnosis not present

## 2020-01-26 DIAGNOSIS — I480 Paroxysmal atrial fibrillation: Secondary | ICD-10-CM | POA: Diagnosis not present

## 2020-01-26 DIAGNOSIS — W19XXXD Unspecified fall, subsequent encounter: Secondary | ICD-10-CM | POA: Diagnosis not present

## 2020-01-27 DIAGNOSIS — M1611 Unilateral primary osteoarthritis, right hip: Secondary | ICD-10-CM | POA: Diagnosis not present

## 2020-01-27 DIAGNOSIS — E1122 Type 2 diabetes mellitus with diabetic chronic kidney disease: Secondary | ICD-10-CM | POA: Diagnosis not present

## 2020-01-27 DIAGNOSIS — M25512 Pain in left shoulder: Secondary | ICD-10-CM | POA: Diagnosis not present

## 2020-01-27 DIAGNOSIS — M5136 Other intervertebral disc degeneration, lumbar region: Secondary | ICD-10-CM | POA: Diagnosis not present

## 2020-01-27 DIAGNOSIS — D631 Anemia in chronic kidney disease: Secondary | ICD-10-CM | POA: Diagnosis not present

## 2020-01-27 DIAGNOSIS — M5412 Radiculopathy, cervical region: Secondary | ICD-10-CM | POA: Diagnosis not present

## 2020-01-27 DIAGNOSIS — J9811 Atelectasis: Secondary | ICD-10-CM | POA: Diagnosis not present

## 2020-01-27 DIAGNOSIS — C44319 Basal cell carcinoma of skin of other parts of face: Secondary | ICD-10-CM | POA: Diagnosis not present

## 2020-01-27 DIAGNOSIS — F329 Major depressive disorder, single episode, unspecified: Secondary | ICD-10-CM | POA: Diagnosis not present

## 2020-01-27 DIAGNOSIS — D0462 Carcinoma in situ of skin of left upper limb, including shoulder: Secondary | ICD-10-CM | POA: Diagnosis not present

## 2020-01-27 DIAGNOSIS — C44229 Squamous cell carcinoma of skin of left ear and external auricular canal: Secondary | ICD-10-CM | POA: Diagnosis not present

## 2020-01-27 DIAGNOSIS — R293 Abnormal posture: Secondary | ICD-10-CM | POA: Diagnosis not present

## 2020-01-27 DIAGNOSIS — G4700 Insomnia, unspecified: Secondary | ICD-10-CM | POA: Diagnosis not present

## 2020-01-27 DIAGNOSIS — K567 Ileus, unspecified: Secondary | ICD-10-CM | POA: Diagnosis not present

## 2020-01-27 DIAGNOSIS — D62 Acute posthemorrhagic anemia: Secondary | ICD-10-CM | POA: Diagnosis not present

## 2020-01-27 DIAGNOSIS — H43813 Vitreous degeneration, bilateral: Secondary | ICD-10-CM | POA: Diagnosis not present

## 2020-01-27 DIAGNOSIS — N400 Enlarged prostate without lower urinary tract symptoms: Secondary | ICD-10-CM | POA: Diagnosis not present

## 2020-01-27 DIAGNOSIS — I110 Hypertensive heart disease with heart failure: Secondary | ICD-10-CM | POA: Diagnosis not present

## 2020-01-27 DIAGNOSIS — Z808 Family history of malignant neoplasm of other organs or systems: Secondary | ICD-10-CM | POA: Diagnosis not present

## 2020-01-27 DIAGNOSIS — J95821 Acute postprocedural respiratory failure: Secondary | ICD-10-CM | POA: Diagnosis not present

## 2020-01-27 DIAGNOSIS — G459 Transient cerebral ischemic attack, unspecified: Secondary | ICD-10-CM | POA: Diagnosis not present

## 2020-01-27 DIAGNOSIS — M189 Osteoarthritis of first carpometacarpal joint, unspecified: Secondary | ICD-10-CM | POA: Diagnosis not present

## 2020-01-27 DIAGNOSIS — Z96651 Presence of right artificial knee joint: Secondary | ICD-10-CM | POA: Diagnosis not present

## 2020-01-27 DIAGNOSIS — E7849 Other hyperlipidemia: Secondary | ICD-10-CM | POA: Diagnosis not present

## 2020-01-27 DIAGNOSIS — C8510 Unspecified B-cell lymphoma, unspecified site: Secondary | ICD-10-CM | POA: Diagnosis not present

## 2020-01-27 DIAGNOSIS — D2262 Melanocytic nevi of left upper limb, including shoulder: Secondary | ICD-10-CM | POA: Diagnosis not present

## 2020-01-27 DIAGNOSIS — E782 Mixed hyperlipidemia: Secondary | ICD-10-CM | POA: Diagnosis not present

## 2020-01-27 DIAGNOSIS — Z7952 Long term (current) use of systemic steroids: Secondary | ICD-10-CM | POA: Diagnosis not present

## 2020-01-27 DIAGNOSIS — R002 Palpitations: Secondary | ICD-10-CM | POA: Diagnosis not present

## 2020-01-27 DIAGNOSIS — I2510 Atherosclerotic heart disease of native coronary artery without angina pectoris: Secondary | ICD-10-CM | POA: Diagnosis not present

## 2020-01-27 DIAGNOSIS — E119 Type 2 diabetes mellitus without complications: Secondary | ICD-10-CM | POA: Diagnosis not present

## 2020-01-27 DIAGNOSIS — M1711 Unilateral primary osteoarthritis, right knee: Secondary | ICD-10-CM | POA: Diagnosis not present

## 2020-01-27 DIAGNOSIS — Q211 Atrial septal defect: Secondary | ICD-10-CM | POA: Diagnosis not present

## 2020-01-27 DIAGNOSIS — C50511 Malignant neoplasm of lower-outer quadrant of right female breast: Secondary | ICD-10-CM | POA: Diagnosis not present

## 2020-01-27 DIAGNOSIS — Z807 Family history of other malignant neoplasms of lymphoid, hematopoietic and related tissues: Secondary | ICD-10-CM | POA: Diagnosis not present

## 2020-01-27 DIAGNOSIS — S72142D Displaced intertrochanteric fracture of left femur, subsequent encounter for closed fracture with routine healing: Secondary | ICD-10-CM | POA: Diagnosis not present

## 2020-01-27 DIAGNOSIS — G939 Disorder of brain, unspecified: Secondary | ICD-10-CM | POA: Diagnosis not present

## 2020-01-27 DIAGNOSIS — R079 Chest pain, unspecified: Secondary | ICD-10-CM | POA: Diagnosis not present

## 2020-01-27 DIAGNOSIS — M5430 Sciatica, unspecified side: Secondary | ICD-10-CM | POA: Diagnosis not present

## 2020-01-27 DIAGNOSIS — M47812 Spondylosis without myelopathy or radiculopathy, cervical region: Secondary | ICD-10-CM | POA: Diagnosis not present

## 2020-01-27 DIAGNOSIS — M9901 Segmental and somatic dysfunction of cervical region: Secondary | ICD-10-CM | POA: Diagnosis not present

## 2020-01-27 DIAGNOSIS — H409 Unspecified glaucoma: Secondary | ICD-10-CM | POA: Diagnosis not present

## 2020-01-27 DIAGNOSIS — Z7901 Long term (current) use of anticoagulants: Secondary | ICD-10-CM | POA: Diagnosis not present

## 2020-01-27 DIAGNOSIS — N184 Chronic kidney disease, stage 4 (severe): Secondary | ICD-10-CM | POA: Diagnosis not present

## 2020-01-27 DIAGNOSIS — M1811 Unilateral primary osteoarthritis of first carpometacarpal joint, right hand: Secondary | ICD-10-CM | POA: Diagnosis not present

## 2020-01-27 DIAGNOSIS — M810 Age-related osteoporosis without current pathological fracture: Secondary | ICD-10-CM | POA: Diagnosis not present

## 2020-01-27 DIAGNOSIS — M79645 Pain in left finger(s): Secondary | ICD-10-CM | POA: Diagnosis not present

## 2020-01-27 DIAGNOSIS — N179 Acute kidney failure, unspecified: Secondary | ICD-10-CM | POA: Diagnosis not present

## 2020-01-27 DIAGNOSIS — N289 Disorder of kidney and ureter, unspecified: Secondary | ICD-10-CM | POA: Diagnosis not present

## 2020-01-27 DIAGNOSIS — L570 Actinic keratosis: Secondary | ICD-10-CM | POA: Diagnosis not present

## 2020-01-27 DIAGNOSIS — L97812 Non-pressure chronic ulcer of other part of right lower leg with fat layer exposed: Secondary | ICD-10-CM | POA: Diagnosis not present

## 2020-01-27 DIAGNOSIS — Z79811 Long term (current) use of aromatase inhibitors: Secondary | ICD-10-CM | POA: Diagnosis not present

## 2020-01-27 DIAGNOSIS — M8580 Other specified disorders of bone density and structure, unspecified site: Secondary | ICD-10-CM | POA: Diagnosis not present

## 2020-01-27 DIAGNOSIS — Z803 Family history of malignant neoplasm of breast: Secondary | ICD-10-CM | POA: Diagnosis not present

## 2020-01-27 DIAGNOSIS — S82141D Displaced bicondylar fracture of right tibia, subsequent encounter for closed fracture with routine healing: Secondary | ICD-10-CM | POA: Diagnosis not present

## 2020-01-27 DIAGNOSIS — I252 Old myocardial infarction: Secondary | ICD-10-CM | POA: Diagnosis not present

## 2020-01-27 DIAGNOSIS — I83028 Varicose veins of left lower extremity with ulcer other part of lower leg: Secondary | ICD-10-CM | POA: Diagnosis not present

## 2020-01-27 DIAGNOSIS — C541 Malignant neoplasm of endometrium: Secondary | ICD-10-CM | POA: Diagnosis not present

## 2020-01-27 DIAGNOSIS — J411 Mucopurulent chronic bronchitis: Secondary | ICD-10-CM | POA: Diagnosis not present

## 2020-01-27 DIAGNOSIS — Z8262 Family history of osteoporosis: Secondary | ICD-10-CM | POA: Diagnosis not present

## 2020-01-27 DIAGNOSIS — I5032 Chronic diastolic (congestive) heart failure: Secondary | ICD-10-CM | POA: Diagnosis not present

## 2020-01-27 DIAGNOSIS — M79662 Pain in left lower leg: Secondary | ICD-10-CM | POA: Diagnosis not present

## 2020-01-27 DIAGNOSIS — H35013 Changes in retinal vascular appearance, bilateral: Secondary | ICD-10-CM | POA: Diagnosis not present

## 2020-01-27 DIAGNOSIS — Z5112 Encounter for antineoplastic immunotherapy: Secondary | ICD-10-CM | POA: Diagnosis not present

## 2020-01-27 DIAGNOSIS — Z833 Family history of diabetes mellitus: Secondary | ICD-10-CM | POA: Diagnosis not present

## 2020-01-27 DIAGNOSIS — E103512 Type 1 diabetes mellitus with proliferative diabetic retinopathy with macular edema, left eye: Secondary | ICD-10-CM | POA: Diagnosis not present

## 2020-01-27 DIAGNOSIS — E781 Pure hyperglyceridemia: Secondary | ICD-10-CM | POA: Diagnosis not present

## 2020-01-27 DIAGNOSIS — I4891 Unspecified atrial fibrillation: Secondary | ICD-10-CM | POA: Diagnosis not present

## 2020-01-27 DIAGNOSIS — S82899A Other fracture of unspecified lower leg, initial encounter for closed fracture: Secondary | ICD-10-CM | POA: Diagnosis not present

## 2020-01-27 DIAGNOSIS — J849 Interstitial pulmonary disease, unspecified: Secondary | ICD-10-CM | POA: Diagnosis not present

## 2020-01-27 DIAGNOSIS — M9903 Segmental and somatic dysfunction of lumbar region: Secondary | ICD-10-CM | POA: Diagnosis not present

## 2020-01-27 DIAGNOSIS — L600 Ingrowing nail: Secondary | ICD-10-CM | POA: Diagnosis not present

## 2020-01-27 DIAGNOSIS — M65841 Other synovitis and tenosynovitis, right hand: Secondary | ICD-10-CM | POA: Diagnosis not present

## 2020-01-27 DIAGNOSIS — N1832 Chronic kidney disease, stage 3b: Secondary | ICD-10-CM | POA: Diagnosis not present

## 2020-01-27 DIAGNOSIS — M25561 Pain in right knee: Secondary | ICD-10-CM | POA: Diagnosis not present

## 2020-01-27 DIAGNOSIS — L820 Inflamed seborrheic keratosis: Secondary | ICD-10-CM | POA: Diagnosis not present

## 2020-01-27 DIAGNOSIS — I639 Cerebral infarction, unspecified: Secondary | ICD-10-CM | POA: Diagnosis not present

## 2020-01-27 DIAGNOSIS — M9902 Segmental and somatic dysfunction of thoracic region: Secondary | ICD-10-CM | POA: Diagnosis not present

## 2020-01-27 DIAGNOSIS — R928 Other abnormal and inconclusive findings on diagnostic imaging of breast: Secondary | ICD-10-CM | POA: Diagnosis not present

## 2020-01-27 DIAGNOSIS — K2930 Chronic superficial gastritis without bleeding: Secondary | ICD-10-CM | POA: Diagnosis not present

## 2020-01-27 DIAGNOSIS — M25511 Pain in right shoulder: Secondary | ICD-10-CM | POA: Diagnosis not present

## 2020-01-27 DIAGNOSIS — M1380 Other specified arthritis, unspecified site: Secondary | ICD-10-CM | POA: Diagnosis not present

## 2020-01-27 DIAGNOSIS — L89153 Pressure ulcer of sacral region, stage 3: Secondary | ICD-10-CM | POA: Diagnosis not present

## 2020-01-27 DIAGNOSIS — Z8679 Personal history of other diseases of the circulatory system: Secondary | ICD-10-CM | POA: Diagnosis not present

## 2020-01-27 DIAGNOSIS — Z20822 Contact with and (suspected) exposure to covid-19: Secondary | ICD-10-CM | POA: Diagnosis not present

## 2020-01-27 DIAGNOSIS — C7800 Secondary malignant neoplasm of unspecified lung: Secondary | ICD-10-CM | POA: Diagnosis not present

## 2020-01-27 DIAGNOSIS — M1990 Unspecified osteoarthritis, unspecified site: Secondary | ICD-10-CM | POA: Diagnosis not present

## 2020-01-27 DIAGNOSIS — S069X9A Unspecified intracranial injury with loss of consciousness of unspecified duration, initial encounter: Secondary | ICD-10-CM | POA: Diagnosis not present

## 2020-01-27 DIAGNOSIS — E1136 Type 2 diabetes mellitus with diabetic cataract: Secondary | ICD-10-CM | POA: Diagnosis not present

## 2020-01-27 DIAGNOSIS — Z87891 Personal history of nicotine dependence: Secondary | ICD-10-CM | POA: Diagnosis not present

## 2020-01-27 DIAGNOSIS — C8330 Diffuse large B-cell lymphoma, unspecified site: Secondary | ICD-10-CM | POA: Diagnosis not present

## 2020-01-27 DIAGNOSIS — E1159 Type 2 diabetes mellitus with other circulatory complications: Secondary | ICD-10-CM | POA: Diagnosis not present

## 2020-01-27 DIAGNOSIS — R922 Inconclusive mammogram: Secondary | ICD-10-CM | POA: Diagnosis not present

## 2020-01-27 DIAGNOSIS — Z8719 Personal history of other diseases of the digestive system: Secondary | ICD-10-CM | POA: Diagnosis not present

## 2020-01-27 DIAGNOSIS — F419 Anxiety disorder, unspecified: Secondary | ICD-10-CM | POA: Diagnosis not present

## 2020-01-27 DIAGNOSIS — I25118 Atherosclerotic heart disease of native coronary artery with other forms of angina pectoris: Secondary | ICD-10-CM | POA: Diagnosis not present

## 2020-01-27 DIAGNOSIS — K228 Other specified diseases of esophagus: Secondary | ICD-10-CM | POA: Diagnosis not present

## 2020-01-27 DIAGNOSIS — D2271 Melanocytic nevi of right lower limb, including hip: Secondary | ICD-10-CM | POA: Diagnosis not present

## 2020-01-27 DIAGNOSIS — H5203 Hypermetropia, bilateral: Secondary | ICD-10-CM | POA: Diagnosis not present

## 2020-01-27 DIAGNOSIS — R279 Unspecified lack of coordination: Secondary | ICD-10-CM | POA: Diagnosis not present

## 2020-01-27 DIAGNOSIS — T8189XA Other complications of procedures, not elsewhere classified, initial encounter: Secondary | ICD-10-CM | POA: Diagnosis not present

## 2020-01-27 DIAGNOSIS — H2513 Age-related nuclear cataract, bilateral: Secondary | ICD-10-CM | POA: Diagnosis not present

## 2020-01-27 DIAGNOSIS — K651 Peritoneal abscess: Secondary | ICD-10-CM | POA: Diagnosis not present

## 2020-01-27 DIAGNOSIS — M5441 Lumbago with sciatica, right side: Secondary | ICD-10-CM | POA: Diagnosis not present

## 2020-01-27 DIAGNOSIS — S82142D Displaced bicondylar fracture of left tibia, subsequent encounter for closed fracture with routine healing: Secondary | ICD-10-CM | POA: Diagnosis not present

## 2020-01-27 DIAGNOSIS — D630 Anemia in neoplastic disease: Secondary | ICD-10-CM | POA: Diagnosis not present

## 2020-01-27 DIAGNOSIS — D693 Immune thrombocytopenic purpura: Secondary | ICD-10-CM | POA: Diagnosis not present

## 2020-01-27 DIAGNOSIS — R1084 Generalized abdominal pain: Secondary | ICD-10-CM | POA: Diagnosis not present

## 2020-01-27 DIAGNOSIS — L821 Other seborrheic keratosis: Secondary | ICD-10-CM | POA: Diagnosis not present

## 2020-01-27 DIAGNOSIS — C61 Malignant neoplasm of prostate: Secondary | ICD-10-CM | POA: Diagnosis not present

## 2020-01-27 DIAGNOSIS — E103551 Type 1 diabetes mellitus with stable proliferative diabetic retinopathy, right eye: Secondary | ICD-10-CM | POA: Diagnosis not present

## 2020-01-27 DIAGNOSIS — Z299 Encounter for prophylactic measures, unspecified: Secondary | ICD-10-CM | POA: Diagnosis not present

## 2020-01-27 DIAGNOSIS — C672 Malignant neoplasm of lateral wall of bladder: Secondary | ICD-10-CM | POA: Diagnosis not present

## 2020-01-27 DIAGNOSIS — M479 Spondylosis, unspecified: Secondary | ICD-10-CM | POA: Diagnosis not present

## 2020-01-27 DIAGNOSIS — T50905A Adverse effect of unspecified drugs, medicaments and biological substances, initial encounter: Secondary | ICD-10-CM | POA: Diagnosis not present

## 2020-01-27 DIAGNOSIS — D485 Neoplasm of uncertain behavior of skin: Secondary | ICD-10-CM | POA: Diagnosis not present

## 2020-01-27 DIAGNOSIS — K5720 Diverticulitis of large intestine with perforation and abscess without bleeding: Secondary | ICD-10-CM | POA: Diagnosis not present

## 2020-01-27 DIAGNOSIS — R109 Unspecified abdominal pain: Secondary | ICD-10-CM | POA: Diagnosis not present

## 2020-01-27 DIAGNOSIS — G8111 Spastic hemiplegia affecting right dominant side: Secondary | ICD-10-CM | POA: Diagnosis not present

## 2020-01-27 DIAGNOSIS — Z6841 Body Mass Index (BMI) 40.0 and over, adult: Secondary | ICD-10-CM | POA: Diagnosis not present

## 2020-01-27 DIAGNOSIS — R490 Dysphonia: Secondary | ICD-10-CM | POA: Diagnosis not present

## 2020-01-27 DIAGNOSIS — E1169 Type 2 diabetes mellitus with other specified complication: Secondary | ICD-10-CM | POA: Diagnosis not present

## 2020-01-27 DIAGNOSIS — Z713 Dietary counseling and surveillance: Secondary | ICD-10-CM | POA: Diagnosis not present

## 2020-01-27 DIAGNOSIS — Z170 Estrogen receptor positive status [ER+]: Secondary | ICD-10-CM | POA: Diagnosis not present

## 2020-01-27 DIAGNOSIS — R4701 Aphasia: Secondary | ICD-10-CM | POA: Diagnosis not present

## 2020-01-27 DIAGNOSIS — M778 Other enthesopathies, not elsewhere classified: Secondary | ICD-10-CM | POA: Diagnosis not present

## 2020-01-27 DIAGNOSIS — M4807 Spinal stenosis, lumbosacral region: Secondary | ICD-10-CM | POA: Diagnosis not present

## 2020-01-27 DIAGNOSIS — I129 Hypertensive chronic kidney disease with stage 1 through stage 4 chronic kidney disease, or unspecified chronic kidney disease: Secondary | ICD-10-CM | POA: Diagnosis not present

## 2020-01-27 DIAGNOSIS — R2689 Other abnormalities of gait and mobility: Secondary | ICD-10-CM | POA: Diagnosis not present

## 2020-01-27 DIAGNOSIS — R82998 Other abnormal findings in urine: Secondary | ICD-10-CM | POA: Diagnosis not present

## 2020-01-27 DIAGNOSIS — R319 Hematuria, unspecified: Secondary | ICD-10-CM | POA: Diagnosis not present

## 2020-01-27 DIAGNOSIS — M47816 Spondylosis without myelopathy or radiculopathy, lumbar region: Secondary | ICD-10-CM | POA: Diagnosis not present

## 2020-01-27 DIAGNOSIS — R531 Weakness: Secondary | ICD-10-CM | POA: Diagnosis not present

## 2020-01-27 DIAGNOSIS — R2681 Unsteadiness on feet: Secondary | ICD-10-CM | POA: Diagnosis not present

## 2020-01-27 DIAGNOSIS — E890 Postprocedural hypothyroidism: Secondary | ICD-10-CM | POA: Diagnosis not present

## 2020-01-27 DIAGNOSIS — K219 Gastro-esophageal reflux disease without esophagitis: Secondary | ICD-10-CM | POA: Diagnosis not present

## 2020-01-27 DIAGNOSIS — M542 Cervicalgia: Secondary | ICD-10-CM | POA: Diagnosis not present

## 2020-01-27 DIAGNOSIS — F341 Dysthymic disorder: Secondary | ICD-10-CM | POA: Diagnosis not present

## 2020-01-27 DIAGNOSIS — R161 Splenomegaly, not elsewhere classified: Secondary | ICD-10-CM | POA: Diagnosis not present

## 2020-01-27 DIAGNOSIS — U071 COVID-19: Secondary | ICD-10-CM | POA: Diagnosis not present

## 2020-01-27 DIAGNOSIS — M5126 Other intervertebral disc displacement, lumbar region: Secondary | ICD-10-CM | POA: Diagnosis not present

## 2020-01-27 DIAGNOSIS — Z823 Family history of stroke: Secondary | ICD-10-CM | POA: Diagnosis not present

## 2020-01-27 DIAGNOSIS — K3189 Other diseases of stomach and duodenum: Secondary | ICD-10-CM | POA: Diagnosis not present

## 2020-01-27 DIAGNOSIS — R4189 Other symptoms and signs involving cognitive functions and awareness: Secondary | ICD-10-CM | POA: Diagnosis not present

## 2020-01-27 DIAGNOSIS — N189 Chronic kidney disease, unspecified: Secondary | ICD-10-CM | POA: Diagnosis not present

## 2020-01-27 DIAGNOSIS — M9905 Segmental and somatic dysfunction of pelvic region: Secondary | ICD-10-CM | POA: Diagnosis not present

## 2020-01-27 DIAGNOSIS — N049 Nephrotic syndrome with unspecified morphologic changes: Secondary | ICD-10-CM | POA: Diagnosis not present

## 2020-01-27 DIAGNOSIS — M955 Acquired deformity of pelvis: Secondary | ICD-10-CM | POA: Diagnosis not present

## 2020-01-27 DIAGNOSIS — C7931 Secondary malignant neoplasm of brain: Secondary | ICD-10-CM | POA: Diagnosis not present

## 2020-01-27 DIAGNOSIS — C4442 Squamous cell carcinoma of skin of scalp and neck: Secondary | ICD-10-CM | POA: Diagnosis not present

## 2020-01-27 DIAGNOSIS — I776 Arteritis, unspecified: Secondary | ICD-10-CM | POA: Diagnosis not present

## 2020-01-27 DIAGNOSIS — Z7409 Other reduced mobility: Secondary | ICD-10-CM | POA: Diagnosis not present

## 2020-01-27 DIAGNOSIS — M5416 Radiculopathy, lumbar region: Secondary | ICD-10-CM | POA: Diagnosis not present

## 2020-01-27 DIAGNOSIS — M818 Other osteoporosis without current pathological fracture: Secondary | ICD-10-CM | POA: Diagnosis not present

## 2020-01-27 DIAGNOSIS — Z5111 Encounter for antineoplastic chemotherapy: Secondary | ICD-10-CM | POA: Diagnosis not present

## 2020-01-27 DIAGNOSIS — M545 Low back pain: Secondary | ICD-10-CM | POA: Diagnosis not present

## 2020-01-27 DIAGNOSIS — R0600 Dyspnea, unspecified: Secondary | ICD-10-CM | POA: Diagnosis not present

## 2020-01-27 DIAGNOSIS — G4733 Obstructive sleep apnea (adult) (pediatric): Secondary | ICD-10-CM | POA: Diagnosis not present

## 2020-01-27 DIAGNOSIS — F0390 Unspecified dementia without behavioral disturbance: Secondary | ICD-10-CM | POA: Diagnosis not present

## 2020-01-27 DIAGNOSIS — Z923 Personal history of irradiation: Secondary | ICD-10-CM | POA: Diagnosis not present

## 2020-01-27 DIAGNOSIS — I63412 Cerebral infarction due to embolism of left middle cerebral artery: Secondary | ICD-10-CM | POA: Diagnosis not present

## 2020-01-27 DIAGNOSIS — M25462 Effusion, left knee: Secondary | ICD-10-CM | POA: Diagnosis not present

## 2020-01-27 DIAGNOSIS — J9601 Acute respiratory failure with hypoxia: Secondary | ICD-10-CM | POA: Diagnosis not present

## 2020-01-27 DIAGNOSIS — Z1389 Encounter for screening for other disorder: Secondary | ICD-10-CM | POA: Diagnosis not present

## 2020-01-27 DIAGNOSIS — G4701 Insomnia due to medical condition: Secondary | ICD-10-CM | POA: Diagnosis not present

## 2020-01-27 DIAGNOSIS — Z85828 Personal history of other malignant neoplasm of skin: Secondary | ICD-10-CM | POA: Diagnosis not present

## 2020-01-27 DIAGNOSIS — N390 Urinary tract infection, site not specified: Secondary | ICD-10-CM | POA: Diagnosis not present

## 2020-01-27 DIAGNOSIS — R5383 Other fatigue: Secondary | ICD-10-CM | POA: Diagnosis not present

## 2020-01-27 DIAGNOSIS — I97821 Postprocedural cerebrovascular infarction during other surgery: Secondary | ICD-10-CM | POA: Diagnosis not present

## 2020-01-27 DIAGNOSIS — I69954 Hemiplegia and hemiparesis following unspecified cerebrovascular disease affecting left non-dominant side: Secondary | ICD-10-CM | POA: Diagnosis not present

## 2020-01-27 DIAGNOSIS — F411 Generalized anxiety disorder: Secondary | ICD-10-CM | POA: Diagnosis not present

## 2020-01-27 DIAGNOSIS — E7800 Pure hypercholesterolemia, unspecified: Secondary | ICD-10-CM | POA: Diagnosis not present

## 2020-01-27 DIAGNOSIS — N3000 Acute cystitis without hematuria: Secondary | ICD-10-CM | POA: Diagnosis not present

## 2020-01-27 DIAGNOSIS — Z79890 Hormone replacement therapy (postmenopausal): Secondary | ICD-10-CM | POA: Diagnosis not present

## 2020-01-27 DIAGNOSIS — I2693 Single subsegmental pulmonary embolism without acute cor pulmonale: Secondary | ICD-10-CM | POA: Diagnosis not present

## 2020-01-27 DIAGNOSIS — X32XXXA Exposure to sunlight, initial encounter: Secondary | ICD-10-CM | POA: Diagnosis not present

## 2020-01-27 DIAGNOSIS — E0590 Thyrotoxicosis, unspecified without thyrotoxic crisis or storm: Secondary | ICD-10-CM | POA: Diagnosis not present

## 2020-01-27 DIAGNOSIS — S46112A Strain of muscle, fascia and tendon of long head of biceps, left arm, initial encounter: Secondary | ICD-10-CM | POA: Diagnosis not present

## 2020-01-27 DIAGNOSIS — M961 Postlaminectomy syndrome, not elsewhere classified: Secondary | ICD-10-CM | POA: Diagnosis not present

## 2020-01-27 DIAGNOSIS — R0989 Other specified symptoms and signs involving the circulatory and respiratory systems: Secondary | ICD-10-CM | POA: Diagnosis not present

## 2020-01-27 DIAGNOSIS — Z853 Personal history of malignant neoplasm of breast: Secondary | ICD-10-CM | POA: Diagnosis not present

## 2020-01-27 DIAGNOSIS — E1165 Type 2 diabetes mellitus with hyperglycemia: Secondary | ICD-10-CM | POA: Diagnosis not present

## 2020-01-27 DIAGNOSIS — Z471 Aftercare following joint replacement surgery: Secondary | ICD-10-CM | POA: Diagnosis not present

## 2020-01-27 DIAGNOSIS — N486 Induration penis plastica: Secondary | ICD-10-CM | POA: Diagnosis not present

## 2020-01-27 DIAGNOSIS — K861 Other chronic pancreatitis: Secondary | ICD-10-CM | POA: Diagnosis not present

## 2020-01-27 DIAGNOSIS — G8191 Hemiplegia, unspecified affecting right dominant side: Secondary | ICD-10-CM | POA: Diagnosis not present

## 2020-01-27 DIAGNOSIS — N3281 Overactive bladder: Secondary | ICD-10-CM | POA: Diagnosis not present

## 2020-01-27 DIAGNOSIS — R339 Retention of urine, unspecified: Secondary | ICD-10-CM | POA: Diagnosis not present

## 2020-01-27 DIAGNOSIS — D509 Iron deficiency anemia, unspecified: Secondary | ICD-10-CM | POA: Diagnosis not present

## 2020-01-27 DIAGNOSIS — S8001XA Contusion of right knee, initial encounter: Secondary | ICD-10-CM | POA: Diagnosis not present

## 2020-01-27 DIAGNOSIS — I67848 Other cerebrovascular vasospasm and vasoconstriction: Secondary | ICD-10-CM | POA: Diagnosis not present

## 2020-01-27 DIAGNOSIS — F324 Major depressive disorder, single episode, in partial remission: Secondary | ICD-10-CM | POA: Diagnosis not present

## 2020-01-27 DIAGNOSIS — F0281 Dementia in other diseases classified elsewhere with behavioral disturbance: Secondary | ICD-10-CM | POA: Diagnosis not present

## 2020-01-27 DIAGNOSIS — D0461 Carcinoma in situ of skin of right upper limb, including shoulder: Secondary | ICD-10-CM | POA: Diagnosis not present

## 2020-01-27 DIAGNOSIS — F17200 Nicotine dependence, unspecified, uncomplicated: Secondary | ICD-10-CM | POA: Diagnosis not present

## 2020-01-27 DIAGNOSIS — G8929 Other chronic pain: Secondary | ICD-10-CM | POA: Diagnosis not present

## 2020-01-27 DIAGNOSIS — J90 Pleural effusion, not elsewhere classified: Secondary | ICD-10-CM | POA: Diagnosis not present

## 2020-01-27 DIAGNOSIS — Z9049 Acquired absence of other specified parts of digestive tract: Secondary | ICD-10-CM | POA: Diagnosis not present

## 2020-01-27 DIAGNOSIS — Z90722 Acquired absence of ovaries, bilateral: Secondary | ICD-10-CM | POA: Diagnosis not present

## 2020-01-27 DIAGNOSIS — M546 Pain in thoracic spine: Secondary | ICD-10-CM | POA: Diagnosis not present

## 2020-01-27 DIAGNOSIS — M6281 Muscle weakness (generalized): Secondary | ICD-10-CM | POA: Diagnosis not present

## 2020-01-27 DIAGNOSIS — M25761 Osteophyte, right knee: Secondary | ICD-10-CM | POA: Diagnosis not present

## 2020-01-27 DIAGNOSIS — M179 Osteoarthritis of knee, unspecified: Secondary | ICD-10-CM | POA: Diagnosis not present

## 2020-01-27 DIAGNOSIS — I480 Paroxysmal atrial fibrillation: Secondary | ICD-10-CM | POA: Diagnosis not present

## 2020-01-27 DIAGNOSIS — F322 Major depressive disorder, single episode, severe without psychotic features: Secondary | ICD-10-CM | POA: Diagnosis not present

## 2020-01-27 DIAGNOSIS — C642 Malignant neoplasm of left kidney, except renal pelvis: Secondary | ICD-10-CM | POA: Diagnosis not present

## 2020-01-27 DIAGNOSIS — I959 Hypotension, unspecified: Secondary | ICD-10-CM | POA: Diagnosis not present

## 2020-01-27 DIAGNOSIS — G40909 Epilepsy, unspecified, not intractable, without status epilepticus: Secondary | ICD-10-CM | POA: Diagnosis not present

## 2020-01-27 DIAGNOSIS — E1121 Type 2 diabetes mellitus with diabetic nephropathy: Secondary | ICD-10-CM | POA: Diagnosis not present

## 2020-01-27 DIAGNOSIS — J439 Emphysema, unspecified: Secondary | ICD-10-CM | POA: Diagnosis not present

## 2020-01-27 DIAGNOSIS — D692 Other nonthrombocytopenic purpura: Secondary | ICD-10-CM | POA: Diagnosis not present

## 2020-01-27 DIAGNOSIS — D500 Iron deficiency anemia secondary to blood loss (chronic): Secondary | ICD-10-CM | POA: Diagnosis not present

## 2020-01-27 DIAGNOSIS — D225 Melanocytic nevi of trunk: Secondary | ICD-10-CM | POA: Diagnosis not present

## 2020-01-27 DIAGNOSIS — M436 Torticollis: Secondary | ICD-10-CM | POA: Diagnosis not present

## 2020-01-27 DIAGNOSIS — S32415D Nondisplaced fracture of anterior wall of left acetabulum, subsequent encounter for fracture with routine healing: Secondary | ICD-10-CM | POA: Diagnosis not present

## 2020-01-27 DIAGNOSIS — E785 Hyperlipidemia, unspecified: Secondary | ICD-10-CM | POA: Diagnosis not present

## 2020-01-27 DIAGNOSIS — Z9221 Personal history of antineoplastic chemotherapy: Secondary | ICD-10-CM | POA: Diagnosis not present

## 2020-01-27 DIAGNOSIS — E039 Hypothyroidism, unspecified: Secondary | ICD-10-CM | POA: Diagnosis not present

## 2020-01-27 DIAGNOSIS — C7951 Secondary malignant neoplasm of bone: Secondary | ICD-10-CM | POA: Diagnosis not present

## 2020-01-27 DIAGNOSIS — N200 Calculus of kidney: Secondary | ICD-10-CM | POA: Diagnosis not present

## 2020-01-27 DIAGNOSIS — C641 Malignant neoplasm of right kidney, except renal pelvis: Secondary | ICD-10-CM | POA: Diagnosis not present

## 2020-01-27 DIAGNOSIS — M25461 Effusion, right knee: Secondary | ICD-10-CM | POA: Diagnosis not present

## 2020-01-27 DIAGNOSIS — H2512 Age-related nuclear cataract, left eye: Secondary | ICD-10-CM | POA: Diagnosis not present

## 2020-01-27 DIAGNOSIS — Z01812 Encounter for preprocedural laboratory examination: Secondary | ICD-10-CM | POA: Diagnosis not present

## 2020-01-27 DIAGNOSIS — M5031 Other cervical disc degeneration,  high cervical region: Secondary | ICD-10-CM | POA: Diagnosis not present

## 2020-01-27 DIAGNOSIS — I5021 Acute systolic (congestive) heart failure: Secondary | ICD-10-CM | POA: Diagnosis not present

## 2020-01-27 DIAGNOSIS — Z96641 Presence of right artificial hip joint: Secondary | ICD-10-CM | POA: Diagnosis not present

## 2020-01-27 DIAGNOSIS — D2261 Melanocytic nevi of right upper limb, including shoulder: Secondary | ICD-10-CM | POA: Diagnosis not present

## 2020-01-27 DIAGNOSIS — M25562 Pain in left knee: Secondary | ICD-10-CM | POA: Diagnosis not present

## 2020-01-27 DIAGNOSIS — J449 Chronic obstructive pulmonary disease, unspecified: Secondary | ICD-10-CM | POA: Diagnosis not present

## 2020-01-27 DIAGNOSIS — Z96652 Presence of left artificial knee joint: Secondary | ICD-10-CM | POA: Diagnosis not present

## 2020-01-27 DIAGNOSIS — N183 Chronic kidney disease, stage 3 unspecified: Secondary | ICD-10-CM | POA: Diagnosis not present

## 2020-01-27 DIAGNOSIS — M531 Cervicobrachial syndrome: Secondary | ICD-10-CM | POA: Diagnosis not present

## 2020-01-27 DIAGNOSIS — I10 Essential (primary) hypertension: Secondary | ICD-10-CM | POA: Diagnosis not present

## 2020-01-27 DIAGNOSIS — Z9013 Acquired absence of bilateral breasts and nipples: Secondary | ICD-10-CM | POA: Diagnosis not present

## 2020-01-27 DIAGNOSIS — S0121XD Laceration without foreign body of nose, subsequent encounter: Secondary | ICD-10-CM | POA: Diagnosis not present

## 2020-01-27 DIAGNOSIS — Z23 Encounter for immunization: Secondary | ICD-10-CM | POA: Diagnosis not present

## 2020-01-27 DIAGNOSIS — L271 Localized skin eruption due to drugs and medicaments taken internally: Secondary | ICD-10-CM | POA: Diagnosis not present

## 2020-01-27 DIAGNOSIS — C9000 Multiple myeloma not having achieved remission: Secondary | ICD-10-CM | POA: Diagnosis not present

## 2020-01-27 DIAGNOSIS — R278 Other lack of coordination: Secondary | ICD-10-CM | POA: Diagnosis not present

## 2020-01-27 DIAGNOSIS — Z0000 Encounter for general adult medical examination without abnormal findings: Secondary | ICD-10-CM | POA: Diagnosis not present

## 2020-01-27 DIAGNOSIS — D649 Anemia, unspecified: Secondary | ICD-10-CM | POA: Diagnosis not present

## 2020-01-27 DIAGNOSIS — Z9181 History of falling: Secondary | ICD-10-CM | POA: Diagnosis not present

## 2020-01-27 DIAGNOSIS — F3177 Bipolar disorder, in partial remission, most recent episode mixed: Secondary | ICD-10-CM | POA: Diagnosis not present

## 2020-01-27 DIAGNOSIS — Z13228 Encounter for screening for other metabolic disorders: Secondary | ICD-10-CM | POA: Diagnosis not present

## 2020-01-27 DIAGNOSIS — R0602 Shortness of breath: Secondary | ICD-10-CM | POA: Diagnosis not present

## 2020-01-27 DIAGNOSIS — C9110 Chronic lymphocytic leukemia of B-cell type not having achieved remission: Secondary | ICD-10-CM | POA: Diagnosis not present

## 2020-01-27 DIAGNOSIS — J910 Malignant pleural effusion: Secondary | ICD-10-CM | POA: Diagnosis not present

## 2020-01-27 DIAGNOSIS — R739 Hyperglycemia, unspecified: Secondary | ICD-10-CM | POA: Diagnosis not present

## 2020-01-27 DIAGNOSIS — K5900 Constipation, unspecified: Secondary | ICD-10-CM | POA: Diagnosis not present

## 2020-01-27 DIAGNOSIS — E559 Vitamin D deficiency, unspecified: Secondary | ICD-10-CM | POA: Diagnosis not present

## 2020-01-27 DIAGNOSIS — S82832D Other fracture of upper and lower end of left fibula, subsequent encounter for closed fracture with routine healing: Secondary | ICD-10-CM | POA: Diagnosis not present

## 2020-01-27 DIAGNOSIS — Z806 Family history of leukemia: Secondary | ICD-10-CM | POA: Diagnosis not present

## 2020-01-27 DIAGNOSIS — E871 Hypo-osmolality and hyponatremia: Secondary | ICD-10-CM | POA: Diagnosis not present

## 2020-01-27 DIAGNOSIS — R21 Rash and other nonspecific skin eruption: Secondary | ICD-10-CM | POA: Diagnosis not present

## 2020-01-27 DIAGNOSIS — F410 Panic disorder [episodic paroxysmal anxiety] without agoraphobia: Secondary | ICD-10-CM | POA: Diagnosis not present

## 2020-01-27 DIAGNOSIS — Z880 Allergy status to penicillin: Secondary | ICD-10-CM | POA: Diagnosis not present

## 2020-01-27 DIAGNOSIS — M5442 Lumbago with sciatica, left side: Secondary | ICD-10-CM | POA: Diagnosis not present

## 2020-01-27 DIAGNOSIS — G301 Alzheimer's disease with late onset: Secondary | ICD-10-CM | POA: Diagnosis not present

## 2020-01-27 DIAGNOSIS — N3001 Acute cystitis with hematuria: Secondary | ICD-10-CM | POA: Diagnosis not present

## 2020-01-27 DIAGNOSIS — G500 Trigeminal neuralgia: Secondary | ICD-10-CM | POA: Diagnosis not present

## 2020-01-27 DIAGNOSIS — M222X1 Patellofemoral disorders, right knee: Secondary | ICD-10-CM | POA: Diagnosis not present

## 2020-01-27 DIAGNOSIS — I82442 Acute embolism and thrombosis of left tibial vein: Secondary | ICD-10-CM | POA: Diagnosis not present

## 2020-01-27 DIAGNOSIS — M4722 Other spondylosis with radiculopathy, cervical region: Secondary | ICD-10-CM | POA: Diagnosis not present

## 2020-01-27 DIAGNOSIS — M069 Rheumatoid arthritis, unspecified: Secondary | ICD-10-CM | POA: Diagnosis not present

## 2020-01-27 DIAGNOSIS — Z7182 Exercise counseling: Secondary | ICD-10-CM | POA: Diagnosis not present

## 2020-01-27 DIAGNOSIS — I158 Other secondary hypertension: Secondary | ICD-10-CM | POA: Diagnosis not present

## 2020-01-27 DIAGNOSIS — S82891D Other fracture of right lower leg, subsequent encounter for closed fracture with routine healing: Secondary | ICD-10-CM | POA: Diagnosis not present

## 2020-01-27 DIAGNOSIS — I83018 Varicose veins of right lower extremity with ulcer other part of lower leg: Secondary | ICD-10-CM | POA: Diagnosis not present

## 2020-01-27 DIAGNOSIS — M25551 Pain in right hip: Secondary | ICD-10-CM | POA: Diagnosis not present

## 2020-01-27 DIAGNOSIS — F331 Major depressive disorder, recurrent, moderate: Secondary | ICD-10-CM | POA: Diagnosis not present

## 2020-01-27 DIAGNOSIS — F0391 Unspecified dementia with behavioral disturbance: Secondary | ICD-10-CM | POA: Diagnosis not present

## 2020-01-27 DIAGNOSIS — L89152 Pressure ulcer of sacral region, stage 2: Secondary | ICD-10-CM | POA: Diagnosis not present

## 2020-01-27 DIAGNOSIS — Q059 Spina bifida, unspecified: Secondary | ICD-10-CM | POA: Diagnosis not present

## 2020-01-27 DIAGNOSIS — Z7982 Long term (current) use of aspirin: Secondary | ICD-10-CM | POA: Diagnosis not present

## 2020-01-27 DIAGNOSIS — N182 Chronic kidney disease, stage 2 (mild): Secondary | ICD-10-CM | POA: Diagnosis not present

## 2020-01-27 DIAGNOSIS — Z125 Encounter for screening for malignant neoplasm of prostate: Secondary | ICD-10-CM | POA: Diagnosis not present

## 2020-01-27 DIAGNOSIS — M62552 Muscle wasting and atrophy, not elsewhere classified, left thigh: Secondary | ICD-10-CM | POA: Diagnosis not present

## 2020-01-27 DIAGNOSIS — L538 Other specified erythematous conditions: Secondary | ICD-10-CM | POA: Diagnosis not present

## 2020-01-27 DIAGNOSIS — M1712 Unilateral primary osteoarthritis, left knee: Secondary | ICD-10-CM | POA: Diagnosis not present

## 2020-01-27 DIAGNOSIS — E538 Deficiency of other specified B group vitamins: Secondary | ICD-10-CM | POA: Diagnosis not present

## 2020-01-27 DIAGNOSIS — M65331 Trigger finger, right middle finger: Secondary | ICD-10-CM | POA: Diagnosis not present

## 2020-01-27 DIAGNOSIS — M25672 Stiffness of left ankle, not elsewhere classified: Secondary | ICD-10-CM | POA: Diagnosis not present

## 2020-01-27 DIAGNOSIS — R296 Repeated falls: Secondary | ICD-10-CM | POA: Diagnosis not present

## 2020-01-27 DIAGNOSIS — C9001 Multiple myeloma in remission: Secondary | ICD-10-CM | POA: Diagnosis not present

## 2020-01-27 DIAGNOSIS — E291 Testicular hypofunction: Secondary | ICD-10-CM | POA: Diagnosis not present

## 2020-01-27 DIAGNOSIS — M8588 Other specified disorders of bone density and structure, other site: Secondary | ICD-10-CM | POA: Diagnosis not present

## 2020-01-27 DIAGNOSIS — L97822 Non-pressure chronic ulcer of other part of left lower leg with fat layer exposed: Secondary | ICD-10-CM | POA: Diagnosis not present

## 2020-01-27 DIAGNOSIS — M170 Bilateral primary osteoarthritis of knee: Secondary | ICD-10-CM | POA: Diagnosis not present

## 2020-01-27 DIAGNOSIS — I6602 Occlusion and stenosis of left middle cerebral artery: Secondary | ICD-10-CM | POA: Diagnosis not present

## 2020-01-27 DIAGNOSIS — C50112 Malignant neoplasm of central portion of left female breast: Secondary | ICD-10-CM | POA: Diagnosis not present

## 2020-01-27 DIAGNOSIS — Z79899 Other long term (current) drug therapy: Secondary | ICD-10-CM | POA: Diagnosis not present

## 2020-01-27 DIAGNOSIS — R55 Syncope and collapse: Secondary | ICD-10-CM | POA: Diagnosis not present

## 2020-01-27 DIAGNOSIS — R911 Solitary pulmonary nodule: Secondary | ICD-10-CM | POA: Diagnosis not present

## 2020-01-27 DIAGNOSIS — G20 Parkinson's disease: Secondary | ICD-10-CM | POA: Diagnosis not present

## 2020-01-27 DIAGNOSIS — M47814 Spondylosis without myelopathy or radiculopathy, thoracic region: Secondary | ICD-10-CM | POA: Diagnosis not present

## 2020-01-27 DIAGNOSIS — M47817 Spondylosis without myelopathy or radiculopathy, lumbosacral region: Secondary | ICD-10-CM | POA: Diagnosis not present

## 2020-01-28 DIAGNOSIS — Z8673 Personal history of transient ischemic attack (TIA), and cerebral infarction without residual deficits: Secondary | ICD-10-CM | POA: Diagnosis not present

## 2020-01-28 DIAGNOSIS — F322 Major depressive disorder, single episode, severe without psychotic features: Secondary | ICD-10-CM | POA: Diagnosis not present

## 2020-01-28 DIAGNOSIS — L814 Other melanin hyperpigmentation: Secondary | ICD-10-CM | POA: Diagnosis not present

## 2020-01-28 DIAGNOSIS — M19019 Primary osteoarthritis, unspecified shoulder: Secondary | ICD-10-CM | POA: Diagnosis not present

## 2020-01-28 DIAGNOSIS — H401132 Primary open-angle glaucoma, bilateral, moderate stage: Secondary | ICD-10-CM | POA: Diagnosis not present

## 2020-01-28 DIAGNOSIS — Z510 Encounter for antineoplastic radiation therapy: Secondary | ICD-10-CM | POA: Diagnosis not present

## 2020-01-28 DIAGNOSIS — N1831 Chronic kidney disease, stage 3a: Secondary | ICD-10-CM | POA: Diagnosis not present

## 2020-01-28 DIAGNOSIS — R279 Unspecified lack of coordination: Secondary | ICD-10-CM | POA: Diagnosis not present

## 2020-01-28 DIAGNOSIS — C44529 Squamous cell carcinoma of skin of other part of trunk: Secondary | ICD-10-CM | POA: Diagnosis not present

## 2020-01-28 DIAGNOSIS — C189 Malignant neoplasm of colon, unspecified: Secondary | ICD-10-CM | POA: Diagnosis not present

## 2020-01-28 DIAGNOSIS — Z8739 Personal history of other diseases of the musculoskeletal system and connective tissue: Secondary | ICD-10-CM | POA: Diagnosis not present

## 2020-01-28 DIAGNOSIS — H5213 Myopia, bilateral: Secondary | ICD-10-CM | POA: Diagnosis not present

## 2020-01-28 DIAGNOSIS — K432 Incisional hernia without obstruction or gangrene: Secondary | ICD-10-CM | POA: Diagnosis not present

## 2020-01-28 DIAGNOSIS — L570 Actinic keratosis: Secondary | ICD-10-CM | POA: Diagnosis not present

## 2020-01-28 DIAGNOSIS — R944 Abnormal results of kidney function studies: Secondary | ICD-10-CM | POA: Diagnosis not present

## 2020-01-28 DIAGNOSIS — U071 COVID-19: Secondary | ICD-10-CM | POA: Diagnosis not present

## 2020-01-28 DIAGNOSIS — R770 Abnormality of albumin: Secondary | ICD-10-CM | POA: Diagnosis not present

## 2020-01-28 DIAGNOSIS — R2689 Other abnormalities of gait and mobility: Secondary | ICD-10-CM | POA: Diagnosis not present

## 2020-01-28 DIAGNOSIS — R5383 Other fatigue: Secondary | ICD-10-CM | POA: Diagnosis not present

## 2020-01-28 DIAGNOSIS — M79672 Pain in left foot: Secondary | ICD-10-CM | POA: Diagnosis not present

## 2020-01-28 DIAGNOSIS — Z7982 Long term (current) use of aspirin: Secondary | ICD-10-CM | POA: Diagnosis not present

## 2020-01-28 DIAGNOSIS — N400 Enlarged prostate without lower urinary tract symptoms: Secondary | ICD-10-CM | POA: Diagnosis not present

## 2020-01-28 DIAGNOSIS — D472 Monoclonal gammopathy: Secondary | ICD-10-CM | POA: Diagnosis not present

## 2020-01-28 DIAGNOSIS — E559 Vitamin D deficiency, unspecified: Secondary | ICD-10-CM | POA: Diagnosis not present

## 2020-01-28 DIAGNOSIS — H52203 Unspecified astigmatism, bilateral: Secondary | ICD-10-CM | POA: Diagnosis not present

## 2020-01-28 DIAGNOSIS — J441 Chronic obstructive pulmonary disease with (acute) exacerbation: Secondary | ICD-10-CM | POA: Diagnosis not present

## 2020-01-28 DIAGNOSIS — E7849 Other hyperlipidemia: Secondary | ICD-10-CM | POA: Diagnosis not present

## 2020-01-28 DIAGNOSIS — R519 Headache, unspecified: Secondary | ICD-10-CM | POA: Diagnosis not present

## 2020-01-28 DIAGNOSIS — M6281 Muscle weakness (generalized): Secondary | ICD-10-CM | POA: Diagnosis not present

## 2020-01-28 DIAGNOSIS — Z09 Encounter for follow-up examination after completed treatment for conditions other than malignant neoplasm: Secondary | ICD-10-CM | POA: Diagnosis not present

## 2020-01-28 DIAGNOSIS — E1169 Type 2 diabetes mellitus with other specified complication: Secondary | ICD-10-CM | POA: Diagnosis not present

## 2020-01-28 DIAGNOSIS — B965 Pseudomonas (aeruginosa) (mallei) (pseudomallei) as the cause of diseases classified elsewhere: Secondary | ICD-10-CM | POA: Diagnosis not present

## 2020-01-28 DIAGNOSIS — L298 Other pruritus: Secondary | ICD-10-CM | POA: Diagnosis not present

## 2020-01-28 DIAGNOSIS — I129 Hypertensive chronic kidney disease with stage 1 through stage 4 chronic kidney disease, or unspecified chronic kidney disease: Secondary | ICD-10-CM | POA: Diagnosis not present

## 2020-01-28 DIAGNOSIS — J4540 Moderate persistent asthma, uncomplicated: Secondary | ICD-10-CM | POA: Diagnosis not present

## 2020-01-28 DIAGNOSIS — M549 Dorsalgia, unspecified: Secondary | ICD-10-CM | POA: Diagnosis not present

## 2020-01-28 DIAGNOSIS — G8112 Spastic hemiplegia affecting left dominant side: Secondary | ICD-10-CM | POA: Diagnosis not present

## 2020-01-28 DIAGNOSIS — H35372 Puckering of macula, left eye: Secondary | ICD-10-CM | POA: Diagnosis not present

## 2020-01-28 DIAGNOSIS — M810 Age-related osteoporosis without current pathological fracture: Secondary | ICD-10-CM | POA: Diagnosis not present

## 2020-01-28 DIAGNOSIS — R000 Tachycardia, unspecified: Secondary | ICD-10-CM | POA: Diagnosis not present

## 2020-01-28 DIAGNOSIS — M25611 Stiffness of right shoulder, not elsewhere classified: Secondary | ICD-10-CM | POA: Diagnosis not present

## 2020-01-28 DIAGNOSIS — H5203 Hypermetropia, bilateral: Secondary | ICD-10-CM | POA: Diagnosis not present

## 2020-01-28 DIAGNOSIS — J3089 Other allergic rhinitis: Secondary | ICD-10-CM | POA: Diagnosis not present

## 2020-01-28 DIAGNOSIS — D631 Anemia in chronic kidney disease: Secondary | ICD-10-CM | POA: Diagnosis not present

## 2020-01-28 DIAGNOSIS — J45909 Unspecified asthma, uncomplicated: Secondary | ICD-10-CM | POA: Diagnosis not present

## 2020-01-28 DIAGNOSIS — H409 Unspecified glaucoma: Secondary | ICD-10-CM | POA: Diagnosis not present

## 2020-01-28 DIAGNOSIS — Z6835 Body mass index (BMI) 35.0-35.9, adult: Secondary | ICD-10-CM | POA: Diagnosis not present

## 2020-01-28 DIAGNOSIS — E11319 Type 2 diabetes mellitus with unspecified diabetic retinopathy without macular edema: Secondary | ICD-10-CM | POA: Diagnosis not present

## 2020-01-28 DIAGNOSIS — R5381 Other malaise: Secondary | ICD-10-CM | POA: Diagnosis not present

## 2020-01-28 DIAGNOSIS — I110 Hypertensive heart disease with heart failure: Secondary | ICD-10-CM | POA: Diagnosis not present

## 2020-01-28 DIAGNOSIS — Z7409 Other reduced mobility: Secondary | ICD-10-CM | POA: Diagnosis not present

## 2020-01-28 DIAGNOSIS — E1122 Type 2 diabetes mellitus with diabetic chronic kidney disease: Secondary | ICD-10-CM | POA: Diagnosis not present

## 2020-01-28 DIAGNOSIS — J309 Allergic rhinitis, unspecified: Secondary | ICD-10-CM | POA: Diagnosis not present

## 2020-01-28 DIAGNOSIS — F332 Major depressive disorder, recurrent severe without psychotic features: Secondary | ICD-10-CM | POA: Diagnosis not present

## 2020-01-28 DIAGNOSIS — G5602 Carpal tunnel syndrome, left upper limb: Secondary | ICD-10-CM | POA: Diagnosis not present

## 2020-01-28 DIAGNOSIS — M25662 Stiffness of left knee, not elsewhere classified: Secondary | ICD-10-CM | POA: Diagnosis not present

## 2020-01-28 DIAGNOSIS — M86172 Other acute osteomyelitis, left ankle and foot: Secondary | ICD-10-CM | POA: Diagnosis not present

## 2020-01-28 DIAGNOSIS — R251 Tremor, unspecified: Secondary | ICD-10-CM | POA: Diagnosis not present

## 2020-01-28 DIAGNOSIS — S82491S Other fracture of shaft of right fibula, sequela: Secondary | ICD-10-CM | POA: Diagnosis not present

## 2020-01-28 DIAGNOSIS — N12 Tubulo-interstitial nephritis, not specified as acute or chronic: Secondary | ICD-10-CM | POA: Diagnosis not present

## 2020-01-28 DIAGNOSIS — M25561 Pain in right knee: Secondary | ICD-10-CM | POA: Diagnosis not present

## 2020-01-28 DIAGNOSIS — J301 Allergic rhinitis due to pollen: Secondary | ICD-10-CM | POA: Diagnosis not present

## 2020-01-28 DIAGNOSIS — H34831 Tributary (branch) retinal vein occlusion, right eye, with macular edema: Secondary | ICD-10-CM | POA: Diagnosis not present

## 2020-01-28 DIAGNOSIS — Z933 Colostomy status: Secondary | ICD-10-CM | POA: Diagnosis not present

## 2020-01-28 DIAGNOSIS — L720 Epidermal cyst: Secondary | ICD-10-CM | POA: Diagnosis not present

## 2020-01-28 DIAGNOSIS — F410 Panic disorder [episodic paroxysmal anxiety] without agoraphobia: Secondary | ICD-10-CM | POA: Diagnosis not present

## 2020-01-28 DIAGNOSIS — Z836 Family history of other diseases of the respiratory system: Secondary | ICD-10-CM | POA: Diagnosis not present

## 2020-01-28 DIAGNOSIS — H401134 Primary open-angle glaucoma, bilateral, indeterminate stage: Secondary | ICD-10-CM | POA: Diagnosis not present

## 2020-01-28 DIAGNOSIS — C61 Malignant neoplasm of prostate: Secondary | ICD-10-CM | POA: Diagnosis not present

## 2020-01-28 DIAGNOSIS — N401 Enlarged prostate with lower urinary tract symptoms: Secondary | ICD-10-CM | POA: Diagnosis not present

## 2020-01-28 DIAGNOSIS — C300 Malignant neoplasm of nasal cavity: Secondary | ICD-10-CM | POA: Diagnosis not present

## 2020-01-28 DIAGNOSIS — H25012 Cortical age-related cataract, left eye: Secondary | ICD-10-CM | POA: Diagnosis not present

## 2020-01-28 DIAGNOSIS — M25511 Pain in right shoulder: Secondary | ICD-10-CM | POA: Diagnosis not present

## 2020-01-28 DIAGNOSIS — R82998 Other abnormal findings in urine: Secondary | ICD-10-CM | POA: Diagnosis not present

## 2020-01-28 DIAGNOSIS — Z125 Encounter for screening for malignant neoplasm of prostate: Secondary | ICD-10-CM | POA: Diagnosis not present

## 2020-01-28 DIAGNOSIS — E7800 Pure hypercholesterolemia, unspecified: Secondary | ICD-10-CM | POA: Diagnosis not present

## 2020-01-28 DIAGNOSIS — Z4789 Encounter for other orthopedic aftercare: Secondary | ICD-10-CM | POA: Diagnosis not present

## 2020-01-28 DIAGNOSIS — M0579 Rheumatoid arthritis with rheumatoid factor of multiple sites without organ or systems involvement: Secondary | ICD-10-CM | POA: Diagnosis not present

## 2020-01-28 DIAGNOSIS — D469 Myelodysplastic syndrome, unspecified: Secondary | ICD-10-CM | POA: Diagnosis not present

## 2020-01-28 DIAGNOSIS — D649 Anemia, unspecified: Secondary | ICD-10-CM | POA: Diagnosis not present

## 2020-01-28 DIAGNOSIS — M1990 Unspecified osteoarthritis, unspecified site: Secondary | ICD-10-CM | POA: Diagnosis not present

## 2020-01-28 DIAGNOSIS — M170 Bilateral primary osteoarthritis of knee: Secondary | ICD-10-CM | POA: Diagnosis not present

## 2020-01-28 DIAGNOSIS — Z87891 Personal history of nicotine dependence: Secondary | ICD-10-CM | POA: Diagnosis not present

## 2020-01-28 DIAGNOSIS — J3081 Allergic rhinitis due to animal (cat) (dog) hair and dander: Secondary | ICD-10-CM | POA: Diagnosis not present

## 2020-01-28 DIAGNOSIS — E782 Mixed hyperlipidemia: Secondary | ICD-10-CM | POA: Diagnosis not present

## 2020-01-28 DIAGNOSIS — M19071 Primary osteoarthritis, right ankle and foot: Secondary | ICD-10-CM | POA: Diagnosis not present

## 2020-01-28 DIAGNOSIS — G4733 Obstructive sleep apnea (adult) (pediatric): Secondary | ICD-10-CM | POA: Diagnosis not present

## 2020-01-28 DIAGNOSIS — S0101XA Laceration without foreign body of scalp, initial encounter: Secondary | ICD-10-CM | POA: Diagnosis not present

## 2020-01-28 DIAGNOSIS — Z23 Encounter for immunization: Secondary | ICD-10-CM | POA: Diagnosis not present

## 2020-01-28 DIAGNOSIS — D225 Melanocytic nevi of trunk: Secondary | ICD-10-CM | POA: Diagnosis not present

## 2020-01-28 DIAGNOSIS — Z96642 Presence of left artificial hip joint: Secondary | ICD-10-CM | POA: Diagnosis not present

## 2020-01-28 DIAGNOSIS — J9611 Chronic respiratory failure with hypoxia: Secondary | ICD-10-CM | POA: Diagnosis not present

## 2020-01-28 DIAGNOSIS — M069 Rheumatoid arthritis, unspecified: Secondary | ICD-10-CM | POA: Diagnosis not present

## 2020-01-28 DIAGNOSIS — R531 Weakness: Secondary | ICD-10-CM | POA: Diagnosis not present

## 2020-01-28 DIAGNOSIS — D696 Thrombocytopenia, unspecified: Secondary | ICD-10-CM | POA: Diagnosis not present

## 2020-01-28 DIAGNOSIS — M25572 Pain in left ankle and joints of left foot: Secondary | ICD-10-CM | POA: Diagnosis not present

## 2020-01-28 DIAGNOSIS — R269 Unspecified abnormalities of gait and mobility: Secondary | ICD-10-CM | POA: Diagnosis not present

## 2020-01-28 DIAGNOSIS — D1722 Benign lipomatous neoplasm of skin and subcutaneous tissue of left arm: Secondary | ICD-10-CM | POA: Diagnosis not present

## 2020-01-28 DIAGNOSIS — I2510 Atherosclerotic heart disease of native coronary artery without angina pectoris: Secondary | ICD-10-CM | POA: Diagnosis not present

## 2020-01-28 DIAGNOSIS — E785 Hyperlipidemia, unspecified: Secondary | ICD-10-CM | POA: Diagnosis not present

## 2020-01-28 DIAGNOSIS — Z0000 Encounter for general adult medical examination without abnormal findings: Secondary | ICD-10-CM | POA: Diagnosis not present

## 2020-01-28 DIAGNOSIS — H26491 Other secondary cataract, right eye: Secondary | ICD-10-CM | POA: Diagnosis not present

## 2020-01-28 DIAGNOSIS — M3500 Sicca syndrome, unspecified: Secondary | ICD-10-CM | POA: Diagnosis not present

## 2020-01-28 DIAGNOSIS — C9002 Multiple myeloma in relapse: Secondary | ICD-10-CM | POA: Diagnosis not present

## 2020-01-28 DIAGNOSIS — H2512 Age-related nuclear cataract, left eye: Secondary | ICD-10-CM | POA: Diagnosis not present

## 2020-01-28 DIAGNOSIS — M25562 Pain in left knee: Secondary | ICD-10-CM | POA: Diagnosis not present

## 2020-01-28 DIAGNOSIS — Z789 Other specified health status: Secondary | ICD-10-CM | POA: Diagnosis not present

## 2020-01-28 DIAGNOSIS — I214 Non-ST elevation (NSTEMI) myocardial infarction: Secondary | ICD-10-CM | POA: Diagnosis not present

## 2020-01-28 DIAGNOSIS — C7989 Secondary malignant neoplasm of other specified sites: Secondary | ICD-10-CM | POA: Diagnosis not present

## 2020-01-28 DIAGNOSIS — Z961 Presence of intraocular lens: Secondary | ICD-10-CM | POA: Diagnosis not present

## 2020-01-28 DIAGNOSIS — Z96651 Presence of right artificial knee joint: Secondary | ICD-10-CM | POA: Diagnosis not present

## 2020-01-28 DIAGNOSIS — I824Y2 Acute embolism and thrombosis of unspecified deep veins of left proximal lower extremity: Secondary | ICD-10-CM | POA: Diagnosis not present

## 2020-01-28 DIAGNOSIS — F411 Generalized anxiety disorder: Secondary | ICD-10-CM | POA: Diagnosis not present

## 2020-01-28 DIAGNOSIS — C7642 Malignant neoplasm of left upper limb: Secondary | ICD-10-CM | POA: Diagnosis not present

## 2020-01-28 DIAGNOSIS — S069X9A Unspecified intracranial injury with loss of consciousness of unspecified duration, initial encounter: Secondary | ICD-10-CM | POA: Diagnosis not present

## 2020-01-28 DIAGNOSIS — H401131 Primary open-angle glaucoma, bilateral, mild stage: Secondary | ICD-10-CM | POA: Diagnosis not present

## 2020-01-28 DIAGNOSIS — K219 Gastro-esophageal reflux disease without esophagitis: Secondary | ICD-10-CM | POA: Diagnosis not present

## 2020-01-28 DIAGNOSIS — Z03818 Encounter for observation for suspected exposure to other biological agents ruled out: Secondary | ICD-10-CM | POA: Diagnosis not present

## 2020-01-28 DIAGNOSIS — Z951 Presence of aortocoronary bypass graft: Secondary | ICD-10-CM | POA: Diagnosis not present

## 2020-01-28 DIAGNOSIS — E875 Hyperkalemia: Secondary | ICD-10-CM | POA: Diagnosis not present

## 2020-01-28 DIAGNOSIS — R109 Unspecified abdominal pain: Secondary | ICD-10-CM | POA: Diagnosis not present

## 2020-01-28 DIAGNOSIS — L918 Other hypertrophic disorders of the skin: Secondary | ICD-10-CM | POA: Diagnosis not present

## 2020-01-28 DIAGNOSIS — L821 Other seborrheic keratosis: Secondary | ICD-10-CM | POA: Diagnosis not present

## 2020-01-28 DIAGNOSIS — Z8249 Family history of ischemic heart disease and other diseases of the circulatory system: Secondary | ICD-10-CM | POA: Diagnosis not present

## 2020-01-28 DIAGNOSIS — M5416 Radiculopathy, lumbar region: Secondary | ICD-10-CM | POA: Diagnosis not present

## 2020-01-28 DIAGNOSIS — L820 Inflamed seborrheic keratosis: Secondary | ICD-10-CM | POA: Diagnosis not present

## 2020-01-28 DIAGNOSIS — M79652 Pain in left thigh: Secondary | ICD-10-CM | POA: Diagnosis not present

## 2020-01-28 DIAGNOSIS — H6121 Impacted cerumen, right ear: Secondary | ICD-10-CM | POA: Diagnosis not present

## 2020-01-28 DIAGNOSIS — Z8261 Family history of arthritis: Secondary | ICD-10-CM | POA: Diagnosis not present

## 2020-01-28 DIAGNOSIS — M189 Osteoarthritis of first carpometacarpal joint, unspecified: Secondary | ICD-10-CM | POA: Diagnosis not present

## 2020-01-28 DIAGNOSIS — M1991 Primary osteoarthritis, unspecified site: Secondary | ICD-10-CM | POA: Diagnosis not present

## 2020-01-28 DIAGNOSIS — E1142 Type 2 diabetes mellitus with diabetic polyneuropathy: Secondary | ICD-10-CM | POA: Diagnosis not present

## 2020-01-28 DIAGNOSIS — K5900 Constipation, unspecified: Secondary | ICD-10-CM | POA: Diagnosis not present

## 2020-01-28 DIAGNOSIS — F331 Major depressive disorder, recurrent, moderate: Secondary | ICD-10-CM | POA: Diagnosis not present

## 2020-01-28 DIAGNOSIS — C7951 Secondary malignant neoplasm of bone: Secondary | ICD-10-CM | POA: Diagnosis not present

## 2020-01-28 DIAGNOSIS — M25552 Pain in left hip: Secondary | ICD-10-CM | POA: Diagnosis not present

## 2020-01-28 DIAGNOSIS — A419 Sepsis, unspecified organism: Secondary | ICD-10-CM | POA: Diagnosis not present

## 2020-01-28 DIAGNOSIS — M152 Bouchard's nodes (with arthropathy): Secondary | ICD-10-CM | POA: Diagnosis not present

## 2020-01-28 DIAGNOSIS — E1165 Type 2 diabetes mellitus with hyperglycemia: Secondary | ICD-10-CM | POA: Diagnosis not present

## 2020-01-28 DIAGNOSIS — H53002 Unspecified amblyopia, left eye: Secondary | ICD-10-CM | POA: Diagnosis not present

## 2020-01-28 DIAGNOSIS — H524 Presbyopia: Secondary | ICD-10-CM | POA: Diagnosis not present

## 2020-01-28 DIAGNOSIS — M47812 Spondylosis without myelopathy or radiculopathy, cervical region: Secondary | ICD-10-CM | POA: Diagnosis not present

## 2020-01-28 DIAGNOSIS — M6259 Muscle wasting and atrophy, not elsewhere classified, multiple sites: Secondary | ICD-10-CM | POA: Diagnosis not present

## 2020-01-28 DIAGNOSIS — M79605 Pain in left leg: Secondary | ICD-10-CM | POA: Diagnosis not present

## 2020-01-28 DIAGNOSIS — G8929 Other chronic pain: Secondary | ICD-10-CM | POA: Diagnosis not present

## 2020-01-28 DIAGNOSIS — Z01812 Encounter for preprocedural laboratory examination: Secondary | ICD-10-CM | POA: Diagnosis not present

## 2020-01-28 DIAGNOSIS — M546 Pain in thoracic spine: Secondary | ICD-10-CM | POA: Diagnosis not present

## 2020-01-28 DIAGNOSIS — I69354 Hemiplegia and hemiparesis following cerebral infarction affecting left non-dominant side: Secondary | ICD-10-CM | POA: Diagnosis not present

## 2020-01-28 DIAGNOSIS — G4700 Insomnia, unspecified: Secondary | ICD-10-CM | POA: Diagnosis not present

## 2020-01-28 DIAGNOSIS — T8189XA Other complications of procedures, not elsewhere classified, initial encounter: Secondary | ICD-10-CM | POA: Diagnosis not present

## 2020-01-28 DIAGNOSIS — L111 Transient acantholytic dermatosis [Grover]: Secondary | ICD-10-CM | POA: Diagnosis not present

## 2020-01-28 DIAGNOSIS — M48062 Spinal stenosis, lumbar region with neurogenic claudication: Secondary | ICD-10-CM | POA: Diagnosis not present

## 2020-01-28 DIAGNOSIS — J449 Chronic obstructive pulmonary disease, unspecified: Secondary | ICD-10-CM | POA: Diagnosis not present

## 2020-01-28 DIAGNOSIS — I2699 Other pulmonary embolism without acute cor pulmonale: Secondary | ICD-10-CM | POA: Diagnosis not present

## 2020-01-28 DIAGNOSIS — D3709 Neoplasm of uncertain behavior of other specified sites of the oral cavity: Secondary | ICD-10-CM | POA: Diagnosis not present

## 2020-01-28 DIAGNOSIS — I5040 Unspecified combined systolic (congestive) and diastolic (congestive) heart failure: Secondary | ICD-10-CM | POA: Diagnosis not present

## 2020-01-28 DIAGNOSIS — L97522 Non-pressure chronic ulcer of other part of left foot with fat layer exposed: Secondary | ICD-10-CM | POA: Diagnosis not present

## 2020-01-28 DIAGNOSIS — Z8616 Personal history of COVID-19: Secondary | ICD-10-CM | POA: Diagnosis not present

## 2020-01-28 DIAGNOSIS — R310 Gross hematuria: Secondary | ICD-10-CM | POA: Diagnosis not present

## 2020-01-28 DIAGNOSIS — E11621 Type 2 diabetes mellitus with foot ulcer: Secondary | ICD-10-CM | POA: Diagnosis not present

## 2020-01-28 DIAGNOSIS — E0842 Diabetes mellitus due to underlying condition with diabetic polyneuropathy: Secondary | ICD-10-CM | POA: Diagnosis not present

## 2020-01-28 DIAGNOSIS — R293 Abnormal posture: Secondary | ICD-10-CM | POA: Diagnosis not present

## 2020-01-28 DIAGNOSIS — Z471 Aftercare following joint replacement surgery: Secondary | ICD-10-CM | POA: Diagnosis not present

## 2020-01-28 DIAGNOSIS — E039 Hypothyroidism, unspecified: Secondary | ICD-10-CM | POA: Diagnosis not present

## 2020-01-28 DIAGNOSIS — M5481 Occipital neuralgia: Secondary | ICD-10-CM | POA: Diagnosis not present

## 2020-01-28 DIAGNOSIS — N183 Chronic kidney disease, stage 3 unspecified: Secondary | ICD-10-CM | POA: Diagnosis not present

## 2020-01-28 DIAGNOSIS — K651 Peritoneal abscess: Secondary | ICD-10-CM | POA: Diagnosis not present

## 2020-01-28 DIAGNOSIS — Z20828 Contact with and (suspected) exposure to other viral communicable diseases: Secondary | ICD-10-CM | POA: Diagnosis not present

## 2020-01-28 DIAGNOSIS — M25512 Pain in left shoulder: Secondary | ICD-10-CM | POA: Diagnosis not present

## 2020-01-28 DIAGNOSIS — G5603 Carpal tunnel syndrome, bilateral upper limbs: Secondary | ICD-10-CM | POA: Diagnosis not present

## 2020-01-28 DIAGNOSIS — F339 Major depressive disorder, recurrent, unspecified: Secondary | ICD-10-CM | POA: Diagnosis not present

## 2020-01-28 DIAGNOSIS — D2271 Melanocytic nevi of right lower limb, including hip: Secondary | ICD-10-CM | POA: Diagnosis not present

## 2020-01-28 DIAGNOSIS — N131 Hydronephrosis with ureteral stricture, not elsewhere classified: Secondary | ICD-10-CM | POA: Diagnosis not present

## 2020-01-28 DIAGNOSIS — I82402 Acute embolism and thrombosis of unspecified deep veins of left lower extremity: Secondary | ICD-10-CM | POA: Diagnosis not present

## 2020-01-28 DIAGNOSIS — R600 Localized edema: Secondary | ICD-10-CM | POA: Diagnosis not present

## 2020-01-28 DIAGNOSIS — H401112 Primary open-angle glaucoma, right eye, moderate stage: Secondary | ICD-10-CM | POA: Diagnosis not present

## 2020-01-28 DIAGNOSIS — D045 Carcinoma in situ of skin of trunk: Secondary | ICD-10-CM | POA: Diagnosis not present

## 2020-01-28 DIAGNOSIS — Z85820 Personal history of malignant melanoma of skin: Secondary | ICD-10-CM | POA: Diagnosis not present

## 2020-01-28 DIAGNOSIS — D485 Neoplasm of uncertain behavior of skin: Secondary | ICD-10-CM | POA: Diagnosis not present

## 2020-01-28 DIAGNOSIS — R911 Solitary pulmonary nodule: Secondary | ICD-10-CM | POA: Diagnosis not present

## 2020-01-28 DIAGNOSIS — M6289 Other specified disorders of muscle: Secondary | ICD-10-CM | POA: Diagnosis not present

## 2020-01-28 DIAGNOSIS — Z13220 Encounter for screening for lipoid disorders: Secondary | ICD-10-CM | POA: Diagnosis not present

## 2020-01-28 DIAGNOSIS — R202 Paresthesia of skin: Secondary | ICD-10-CM | POA: Diagnosis not present

## 2020-01-28 DIAGNOSIS — H401111 Primary open-angle glaucoma, right eye, mild stage: Secondary | ICD-10-CM | POA: Diagnosis not present

## 2020-01-28 DIAGNOSIS — Z923 Personal history of irradiation: Secondary | ICD-10-CM | POA: Diagnosis not present

## 2020-01-28 DIAGNOSIS — R079 Chest pain, unspecified: Secondary | ICD-10-CM | POA: Diagnosis not present

## 2020-01-28 DIAGNOSIS — S066X0D Traumatic subarachnoid hemorrhage without loss of consciousness, subsequent encounter: Secondary | ICD-10-CM | POA: Diagnosis not present

## 2020-01-28 DIAGNOSIS — H2513 Age-related nuclear cataract, bilateral: Secondary | ICD-10-CM | POA: Diagnosis not present

## 2020-01-28 DIAGNOSIS — Z720 Tobacco use: Secondary | ICD-10-CM | POA: Diagnosis not present

## 2020-01-28 DIAGNOSIS — I5032 Chronic diastolic (congestive) heart failure: Secondary | ICD-10-CM | POA: Diagnosis not present

## 2020-01-28 DIAGNOSIS — L03116 Cellulitis of left lower limb: Secondary | ICD-10-CM | POA: Diagnosis not present

## 2020-01-28 DIAGNOSIS — D1801 Hemangioma of skin and subcutaneous tissue: Secondary | ICD-10-CM | POA: Diagnosis not present

## 2020-01-28 DIAGNOSIS — F419 Anxiety disorder, unspecified: Secondary | ICD-10-CM | POA: Diagnosis not present

## 2020-01-28 DIAGNOSIS — Z87442 Personal history of urinary calculi: Secondary | ICD-10-CM | POA: Diagnosis not present

## 2020-01-28 DIAGNOSIS — D692 Other nonthrombocytopenic purpura: Secondary | ICD-10-CM | POA: Diagnosis not present

## 2020-01-28 DIAGNOSIS — F325 Major depressive disorder, single episode, in full remission: Secondary | ICD-10-CM | POA: Diagnosis not present

## 2020-01-28 DIAGNOSIS — M25642 Stiffness of left hand, not elsewhere classified: Secondary | ICD-10-CM | POA: Diagnosis not present

## 2020-01-28 DIAGNOSIS — H3582 Retinal ischemia: Secondary | ICD-10-CM | POA: Diagnosis not present

## 2020-01-28 DIAGNOSIS — R0602 Shortness of breath: Secondary | ICD-10-CM | POA: Diagnosis not present

## 2020-01-28 DIAGNOSIS — D2272 Melanocytic nevi of left lower limb, including hip: Secondary | ICD-10-CM | POA: Diagnosis not present

## 2020-01-28 DIAGNOSIS — R42 Dizziness and giddiness: Secondary | ICD-10-CM | POA: Diagnosis not present

## 2020-01-28 DIAGNOSIS — M542 Cervicalgia: Secondary | ICD-10-CM | POA: Diagnosis not present

## 2020-01-28 DIAGNOSIS — H35033 Hypertensive retinopathy, bilateral: Secondary | ICD-10-CM | POA: Diagnosis not present

## 2020-01-28 DIAGNOSIS — H3320 Serous retinal detachment, unspecified eye: Secondary | ICD-10-CM | POA: Diagnosis not present

## 2020-01-28 DIAGNOSIS — Z1331 Encounter for screening for depression: Secondary | ICD-10-CM | POA: Diagnosis not present

## 2020-01-28 DIAGNOSIS — Z96652 Presence of left artificial knee joint: Secondary | ICD-10-CM | POA: Diagnosis not present

## 2020-01-28 DIAGNOSIS — I25119 Atherosclerotic heart disease of native coronary artery with unspecified angina pectoris: Secondary | ICD-10-CM | POA: Diagnosis not present

## 2020-01-28 DIAGNOSIS — N1832 Chronic kidney disease, stage 3b: Secondary | ICD-10-CM | POA: Diagnosis not present

## 2020-01-28 DIAGNOSIS — D61818 Other pancytopenia: Secondary | ICD-10-CM | POA: Diagnosis not present

## 2020-01-28 DIAGNOSIS — H02135 Senile ectropion of left lower eyelid: Secondary | ICD-10-CM | POA: Diagnosis not present

## 2020-01-28 DIAGNOSIS — S42212D Unspecified displaced fracture of surgical neck of left humerus, subsequent encounter for fracture with routine healing: Secondary | ICD-10-CM | POA: Diagnosis not present

## 2020-01-28 DIAGNOSIS — C7911 Secondary malignant neoplasm of bladder: Secondary | ICD-10-CM | POA: Diagnosis not present

## 2020-01-28 DIAGNOSIS — N184 Chronic kidney disease, stage 4 (severe): Secondary | ICD-10-CM | POA: Diagnosis not present

## 2020-01-28 DIAGNOSIS — M5442 Lumbago with sciatica, left side: Secondary | ICD-10-CM | POA: Diagnosis not present

## 2020-01-28 DIAGNOSIS — Z1339 Encounter for screening examination for other mental health and behavioral disorders: Secondary | ICD-10-CM | POA: Diagnosis not present

## 2020-01-28 DIAGNOSIS — I10 Essential (primary) hypertension: Secondary | ICD-10-CM | POA: Diagnosis not present

## 2020-01-28 DIAGNOSIS — M8580 Other specified disorders of bone density and structure, unspecified site: Secondary | ICD-10-CM | POA: Diagnosis not present

## 2020-01-28 DIAGNOSIS — H04123 Dry eye syndrome of bilateral lacrimal glands: Secondary | ICD-10-CM | POA: Diagnosis not present

## 2020-01-28 DIAGNOSIS — R2681 Unsteadiness on feet: Secondary | ICD-10-CM | POA: Diagnosis not present

## 2020-01-28 DIAGNOSIS — R41841 Cognitive communication deficit: Secondary | ICD-10-CM | POA: Diagnosis not present

## 2020-01-28 DIAGNOSIS — F3341 Major depressive disorder, recurrent, in partial remission: Secondary | ICD-10-CM | POA: Diagnosis not present

## 2020-01-28 DIAGNOSIS — E538 Deficiency of other specified B group vitamins: Secondary | ICD-10-CM | POA: Diagnosis not present

## 2020-01-28 DIAGNOSIS — R5382 Chronic fatigue, unspecified: Secondary | ICD-10-CM | POA: Diagnosis not present

## 2020-01-28 DIAGNOSIS — Z79899 Other long term (current) drug therapy: Secondary | ICD-10-CM | POA: Diagnosis not present

## 2020-01-28 DIAGNOSIS — M62830 Muscle spasm of back: Secondary | ICD-10-CM | POA: Diagnosis not present

## 2020-01-28 DIAGNOSIS — C649 Malignant neoplasm of unspecified kidney, except renal pelvis: Secondary | ICD-10-CM | POA: Diagnosis not present

## 2020-01-28 DIAGNOSIS — N182 Chronic kidney disease, stage 2 (mild): Secondary | ICD-10-CM | POA: Diagnosis not present

## 2020-01-28 DIAGNOSIS — M25571 Pain in right ankle and joints of right foot: Secondary | ICD-10-CM | POA: Diagnosis not present

## 2020-01-28 DIAGNOSIS — L97514 Non-pressure chronic ulcer of other part of right foot with necrosis of bone: Secondary | ICD-10-CM | POA: Diagnosis not present

## 2020-01-28 DIAGNOSIS — M545 Low back pain: Secondary | ICD-10-CM | POA: Diagnosis not present

## 2020-01-28 DIAGNOSIS — F0390 Unspecified dementia without behavioral disturbance: Secondary | ICD-10-CM | POA: Diagnosis not present

## 2020-01-28 DIAGNOSIS — I739 Peripheral vascular disease, unspecified: Secondary | ICD-10-CM | POA: Diagnosis not present

## 2020-01-28 DIAGNOSIS — M5441 Lumbago with sciatica, right side: Secondary | ICD-10-CM | POA: Diagnosis not present

## 2020-01-28 DIAGNOSIS — Z85828 Personal history of other malignant neoplasm of skin: Secondary | ICD-10-CM | POA: Diagnosis not present

## 2020-01-28 DIAGNOSIS — R296 Repeated falls: Secondary | ICD-10-CM | POA: Diagnosis not present

## 2020-01-28 DIAGNOSIS — Z741 Need for assistance with personal care: Secondary | ICD-10-CM | POA: Diagnosis not present

## 2020-01-28 DIAGNOSIS — S134XXD Sprain of ligaments of cervical spine, subsequent encounter: Secondary | ICD-10-CM | POA: Diagnosis not present

## 2020-01-28 DIAGNOSIS — Z713 Dietary counseling and surveillance: Secondary | ICD-10-CM | POA: Diagnosis not present

## 2020-01-28 DIAGNOSIS — R1030 Lower abdominal pain, unspecified: Secondary | ICD-10-CM | POA: Diagnosis not present

## 2020-01-28 DIAGNOSIS — Z6841 Body Mass Index (BMI) 40.0 and over, adult: Secondary | ICD-10-CM | POA: Diagnosis not present

## 2020-01-28 DIAGNOSIS — Z794 Long term (current) use of insulin: Secondary | ICD-10-CM | POA: Diagnosis not present

## 2020-01-28 DIAGNOSIS — E119 Type 2 diabetes mellitus without complications: Secondary | ICD-10-CM | POA: Diagnosis not present

## 2020-01-28 DIAGNOSIS — S82191S Other fracture of upper end of right tibia, sequela: Secondary | ICD-10-CM | POA: Diagnosis not present

## 2020-01-28 DIAGNOSIS — J40 Bronchitis, not specified as acute or chronic: Secondary | ICD-10-CM | POA: Diagnosis not present

## 2020-01-28 DIAGNOSIS — Z124 Encounter for screening for malignant neoplasm of cervix: Secondary | ICD-10-CM | POA: Diagnosis not present

## 2020-01-28 DIAGNOSIS — E781 Pure hyperglyceridemia: Secondary | ICD-10-CM | POA: Diagnosis not present

## 2020-01-28 DIAGNOSIS — M25652 Stiffness of left hip, not elsewhere classified: Secondary | ICD-10-CM | POA: Diagnosis not present

## 2020-01-28 DIAGNOSIS — F4320 Adjustment disorder, unspecified: Secondary | ICD-10-CM | POA: Diagnosis not present

## 2020-01-28 DIAGNOSIS — C44612 Basal cell carcinoma of skin of right upper limb, including shoulder: Secondary | ICD-10-CM | POA: Diagnosis not present

## 2020-01-28 DIAGNOSIS — N390 Urinary tract infection, site not specified: Secondary | ICD-10-CM | POA: Diagnosis not present

## 2020-01-28 DIAGNOSIS — C787 Secondary malignant neoplasm of liver and intrahepatic bile duct: Secondary | ICD-10-CM | POA: Diagnosis not present

## 2020-01-28 DIAGNOSIS — R262 Difficulty in walking, not elsewhere classified: Secondary | ICD-10-CM | POA: Diagnosis not present

## 2020-01-28 DIAGNOSIS — M79602 Pain in left arm: Secondary | ICD-10-CM | POA: Diagnosis not present

## 2020-01-28 DIAGNOSIS — R6889 Other general symptoms and signs: Secondary | ICD-10-CM | POA: Diagnosis not present

## 2020-01-28 DIAGNOSIS — Z20822 Contact with and (suspected) exposure to covid-19: Secondary | ICD-10-CM | POA: Diagnosis not present

## 2020-01-28 DIAGNOSIS — K5720 Diverticulitis of large intestine with perforation and abscess without bleeding: Secondary | ICD-10-CM | POA: Diagnosis not present

## 2020-01-29 DIAGNOSIS — K219 Gastro-esophageal reflux disease without esophagitis: Secondary | ICD-10-CM | POA: Diagnosis not present

## 2020-01-29 DIAGNOSIS — F0391 Unspecified dementia with behavioral disturbance: Secondary | ICD-10-CM | POA: Diagnosis not present

## 2020-01-29 DIAGNOSIS — E079 Disorder of thyroid, unspecified: Secondary | ICD-10-CM | POA: Diagnosis not present

## 2020-01-29 DIAGNOSIS — Z046 Encounter for general psychiatric examination, requested by authority: Secondary | ICD-10-CM | POA: Diagnosis not present

## 2020-01-29 DIAGNOSIS — I10 Essential (primary) hypertension: Secondary | ICD-10-CM | POA: Diagnosis not present

## 2020-01-29 DIAGNOSIS — E876 Hypokalemia: Secondary | ICD-10-CM | POA: Diagnosis not present

## 2020-01-29 DIAGNOSIS — M6259 Muscle wasting and atrophy, not elsewhere classified, multiple sites: Secondary | ICD-10-CM | POA: Diagnosis not present

## 2020-01-29 DIAGNOSIS — R2689 Other abnormalities of gait and mobility: Secondary | ICD-10-CM | POA: Diagnosis not present

## 2020-01-29 DIAGNOSIS — I480 Paroxysmal atrial fibrillation: Secondary | ICD-10-CM | POA: Diagnosis not present

## 2020-01-29 DIAGNOSIS — M19041 Primary osteoarthritis, right hand: Secondary | ICD-10-CM | POA: Diagnosis not present

## 2020-01-29 DIAGNOSIS — S32592D Other specified fracture of left pubis, subsequent encounter for fracture with routine healing: Secondary | ICD-10-CM | POA: Diagnosis not present

## 2020-01-29 DIAGNOSIS — R296 Repeated falls: Secondary | ICD-10-CM | POA: Diagnosis not present

## 2020-01-29 DIAGNOSIS — R1084 Generalized abdominal pain: Secondary | ICD-10-CM | POA: Diagnosis not present

## 2020-01-29 DIAGNOSIS — G453 Amaurosis fugax: Secondary | ICD-10-CM | POA: Diagnosis not present

## 2020-01-29 DIAGNOSIS — E785 Hyperlipidemia, unspecified: Secondary | ICD-10-CM | POA: Diagnosis not present

## 2020-01-29 DIAGNOSIS — I255 Ischemic cardiomyopathy: Secondary | ICD-10-CM | POA: Diagnosis not present

## 2020-01-29 DIAGNOSIS — U071 COVID-19: Secondary | ICD-10-CM | POA: Diagnosis not present

## 2020-01-29 DIAGNOSIS — G309 Alzheimer's disease, unspecified: Secondary | ICD-10-CM | POA: Diagnosis not present

## 2020-01-29 DIAGNOSIS — M5416 Radiculopathy, lumbar region: Secondary | ICD-10-CM | POA: Diagnosis not present

## 2020-01-29 DIAGNOSIS — R262 Difficulty in walking, not elsewhere classified: Secondary | ICD-10-CM | POA: Diagnosis not present

## 2020-01-29 DIAGNOSIS — Z20822 Contact with and (suspected) exposure to covid-19: Secondary | ICD-10-CM | POA: Diagnosis not present

## 2020-01-29 DIAGNOSIS — R41841 Cognitive communication deficit: Secondary | ICD-10-CM | POA: Diagnosis not present

## 2020-01-29 DIAGNOSIS — G629 Polyneuropathy, unspecified: Secondary | ICD-10-CM | POA: Diagnosis not present

## 2020-01-29 DIAGNOSIS — J449 Chronic obstructive pulmonary disease, unspecified: Secondary | ICD-10-CM | POA: Diagnosis not present

## 2020-01-29 DIAGNOSIS — R410 Disorientation, unspecified: Secondary | ICD-10-CM | POA: Diagnosis not present

## 2020-01-29 DIAGNOSIS — E871 Hypo-osmolality and hyponatremia: Secondary | ICD-10-CM | POA: Diagnosis not present

## 2020-01-29 DIAGNOSIS — N183 Chronic kidney disease, stage 3 unspecified: Secondary | ICD-10-CM | POA: Diagnosis not present

## 2020-01-29 DIAGNOSIS — R531 Weakness: Secondary | ICD-10-CM | POA: Diagnosis not present

## 2020-01-29 DIAGNOSIS — M48061 Spinal stenosis, lumbar region without neurogenic claudication: Secondary | ICD-10-CM | POA: Diagnosis not present

## 2020-01-29 DIAGNOSIS — E860 Dehydration: Secondary | ICD-10-CM | POA: Diagnosis not present

## 2020-01-29 DIAGNOSIS — E039 Hypothyroidism, unspecified: Secondary | ICD-10-CM | POA: Diagnosis not present

## 2020-01-29 DIAGNOSIS — F0280 Dementia in other diseases classified elsewhere without behavioral disturbance: Secondary | ICD-10-CM | POA: Diagnosis not present

## 2020-01-29 DIAGNOSIS — M810 Age-related osteoporosis without current pathological fracture: Secondary | ICD-10-CM | POA: Diagnosis not present

## 2020-01-29 DIAGNOSIS — C187 Malignant neoplasm of sigmoid colon: Secondary | ICD-10-CM | POA: Diagnosis not present

## 2020-01-29 DIAGNOSIS — S2242XD Multiple fractures of ribs, left side, subsequent encounter for fracture with routine healing: Secondary | ICD-10-CM | POA: Diagnosis not present

## 2020-01-29 DIAGNOSIS — Z9181 History of falling: Secondary | ICD-10-CM | POA: Diagnosis not present

## 2020-01-29 DIAGNOSIS — I4891 Unspecified atrial fibrillation: Secondary | ICD-10-CM | POA: Diagnosis not present

## 2020-01-29 DIAGNOSIS — R0902 Hypoxemia: Secondary | ICD-10-CM | POA: Diagnosis not present

## 2020-01-29 DIAGNOSIS — M12811 Other specific arthropathies, not elsewhere classified, right shoulder: Secondary | ICD-10-CM | POA: Diagnosis not present

## 2020-01-29 DIAGNOSIS — F4323 Adjustment disorder with mixed anxiety and depressed mood: Secondary | ICD-10-CM | POA: Diagnosis not present

## 2020-01-29 DIAGNOSIS — Z7401 Bed confinement status: Secondary | ICD-10-CM | POA: Diagnosis not present

## 2020-01-29 DIAGNOSIS — R1013 Epigastric pain: Secondary | ICD-10-CM | POA: Diagnosis not present

## 2020-01-29 DIAGNOSIS — S0990XA Unspecified injury of head, initial encounter: Secondary | ICD-10-CM | POA: Diagnosis not present

## 2020-01-29 DIAGNOSIS — Z4789 Encounter for other orthopedic aftercare: Secondary | ICD-10-CM | POA: Diagnosis not present

## 2020-01-29 DIAGNOSIS — S0181XA Laceration without foreign body of other part of head, initial encounter: Secondary | ICD-10-CM | POA: Diagnosis not present

## 2020-01-29 DIAGNOSIS — Z48811 Encounter for surgical aftercare following surgery on the nervous system: Secondary | ICD-10-CM | POA: Diagnosis not present

## 2020-01-29 DIAGNOSIS — Z79899 Other long term (current) drug therapy: Secondary | ICD-10-CM | POA: Diagnosis not present

## 2020-01-29 DIAGNOSIS — F329 Major depressive disorder, single episode, unspecified: Secondary | ICD-10-CM | POA: Diagnosis not present

## 2020-01-29 DIAGNOSIS — I69151 Hemiplegia and hemiparesis following nontraumatic intracerebral hemorrhage affecting right dominant side: Secondary | ICD-10-CM | POA: Diagnosis not present

## 2020-01-29 DIAGNOSIS — N179 Acute kidney failure, unspecified: Secondary | ICD-10-CM | POA: Diagnosis not present

## 2020-01-29 DIAGNOSIS — R7989 Other specified abnormal findings of blood chemistry: Secondary | ICD-10-CM | POA: Diagnosis not present

## 2020-01-29 DIAGNOSIS — I2510 Atherosclerotic heart disease of native coronary artery without angina pectoris: Secondary | ICD-10-CM | POA: Diagnosis not present

## 2020-01-29 DIAGNOSIS — S0001XA Abrasion of scalp, initial encounter: Secondary | ICD-10-CM | POA: Diagnosis not present

## 2020-01-29 DIAGNOSIS — Z23 Encounter for immunization: Secondary | ICD-10-CM | POA: Diagnosis not present

## 2020-01-29 DIAGNOSIS — Z742 Need for assistance at home and no other household member able to render care: Secondary | ICD-10-CM | POA: Diagnosis not present

## 2020-01-29 DIAGNOSIS — K5641 Fecal impaction: Secondary | ICD-10-CM | POA: Diagnosis not present

## 2020-01-29 DIAGNOSIS — F339 Major depressive disorder, recurrent, unspecified: Secondary | ICD-10-CM | POA: Diagnosis not present

## 2020-01-29 DIAGNOSIS — Z951 Presence of aortocoronary bypass graft: Secondary | ICD-10-CM | POA: Diagnosis not present

## 2020-01-29 DIAGNOSIS — R339 Retention of urine, unspecified: Secondary | ICD-10-CM | POA: Diagnosis not present

## 2020-01-29 DIAGNOSIS — I6523 Occlusion and stenosis of bilateral carotid arteries: Secondary | ICD-10-CM | POA: Diagnosis not present

## 2020-01-29 DIAGNOSIS — Z96653 Presence of artificial knee joint, bilateral: Secondary | ICD-10-CM | POA: Diagnosis not present

## 2020-01-29 DIAGNOSIS — M25561 Pain in right knee: Secondary | ICD-10-CM | POA: Diagnosis not present

## 2020-01-29 DIAGNOSIS — R4182 Altered mental status, unspecified: Secondary | ICD-10-CM | POA: Diagnosis not present

## 2020-01-29 DIAGNOSIS — D6859 Other primary thrombophilia: Secondary | ICD-10-CM | POA: Diagnosis not present

## 2020-01-29 DIAGNOSIS — M170 Bilateral primary osteoarthritis of knee: Secondary | ICD-10-CM | POA: Diagnosis not present

## 2020-01-29 DIAGNOSIS — R110 Nausea: Secondary | ICD-10-CM | POA: Diagnosis not present

## 2020-01-29 DIAGNOSIS — F419 Anxiety disorder, unspecified: Secondary | ICD-10-CM | POA: Diagnosis not present

## 2020-01-29 DIAGNOSIS — R9431 Abnormal electrocardiogram [ECG] [EKG]: Secondary | ICD-10-CM | POA: Diagnosis not present

## 2020-01-29 DIAGNOSIS — S0003XA Contusion of scalp, initial encounter: Secondary | ICD-10-CM | POA: Diagnosis not present

## 2020-01-29 DIAGNOSIS — Z01812 Encounter for preprocedural laboratory examination: Secondary | ICD-10-CM | POA: Diagnosis not present

## 2020-01-29 DIAGNOSIS — M5136 Other intervertebral disc degeneration, lumbar region: Secondary | ICD-10-CM | POA: Diagnosis not present

## 2020-01-29 DIAGNOSIS — Z794 Long term (current) use of insulin: Secondary | ICD-10-CM | POA: Diagnosis not present

## 2020-01-29 DIAGNOSIS — I129 Hypertensive chronic kidney disease with stage 1 through stage 4 chronic kidney disease, or unspecified chronic kidney disease: Secondary | ICD-10-CM | POA: Diagnosis not present

## 2020-01-29 DIAGNOSIS — M25562 Pain in left knee: Secondary | ICD-10-CM | POA: Diagnosis not present

## 2020-01-29 DIAGNOSIS — R079 Chest pain, unspecified: Secondary | ICD-10-CM | POA: Diagnosis not present

## 2020-01-29 DIAGNOSIS — G20 Parkinson's disease: Secondary | ICD-10-CM | POA: Diagnosis not present

## 2020-01-29 DIAGNOSIS — F0281 Dementia in other diseases classified elsewhere with behavioral disturbance: Secondary | ICD-10-CM | POA: Diagnosis not present

## 2020-01-29 DIAGNOSIS — I499 Cardiac arrhythmia, unspecified: Secondary | ICD-10-CM | POA: Diagnosis not present

## 2020-01-29 DIAGNOSIS — E119 Type 2 diabetes mellitus without complications: Secondary | ICD-10-CM | POA: Diagnosis not present

## 2020-01-29 DIAGNOSIS — I69193 Ataxia following nontraumatic intracerebral hemorrhage: Secondary | ICD-10-CM | POA: Diagnosis not present

## 2020-01-29 DIAGNOSIS — S069X9A Unspecified intracranial injury with loss of consciousness of unspecified duration, initial encounter: Secondary | ICD-10-CM | POA: Diagnosis not present

## 2020-01-29 DIAGNOSIS — M47816 Spondylosis without myelopathy or radiculopathy, lumbar region: Secondary | ICD-10-CM | POA: Diagnosis not present

## 2020-01-29 DIAGNOSIS — R112 Nausea with vomiting, unspecified: Secondary | ICD-10-CM | POA: Diagnosis not present

## 2020-01-29 DIAGNOSIS — R0781 Pleurodynia: Secondary | ICD-10-CM | POA: Diagnosis not present

## 2020-01-29 DIAGNOSIS — N1831 Chronic kidney disease, stage 3a: Secondary | ICD-10-CM | POA: Diagnosis not present

## 2020-01-29 DIAGNOSIS — E1169 Type 2 diabetes mellitus with other specified complication: Secondary | ICD-10-CM | POA: Diagnosis not present

## 2020-01-29 DIAGNOSIS — I951 Orthostatic hypotension: Secondary | ICD-10-CM | POA: Diagnosis not present

## 2020-01-29 DIAGNOSIS — M6281 Muscle weakness (generalized): Secondary | ICD-10-CM | POA: Diagnosis not present

## 2020-01-29 DIAGNOSIS — E1065 Type 1 diabetes mellitus with hyperglycemia: Secondary | ICD-10-CM | POA: Diagnosis not present

## 2020-01-29 DIAGNOSIS — R2681 Unsteadiness on feet: Secondary | ICD-10-CM | POA: Diagnosis not present

## 2020-01-29 DIAGNOSIS — R52 Pain, unspecified: Secondary | ICD-10-CM | POA: Diagnosis not present

## 2020-01-29 DIAGNOSIS — R000 Tachycardia, unspecified: Secondary | ICD-10-CM | POA: Diagnosis not present

## 2020-01-29 DIAGNOSIS — R0789 Other chest pain: Secondary | ICD-10-CM | POA: Diagnosis not present

## 2020-01-29 DIAGNOSIS — D72829 Elevated white blood cell count, unspecified: Secondary | ICD-10-CM | POA: Diagnosis not present

## 2020-01-29 DIAGNOSIS — G8918 Other acute postprocedural pain: Secondary | ICD-10-CM | POA: Diagnosis not present

## 2020-01-30 DIAGNOSIS — I517 Cardiomegaly: Secondary | ICD-10-CM | POA: Diagnosis not present

## 2020-01-30 DIAGNOSIS — U071 COVID-19: Secondary | ICD-10-CM | POA: Diagnosis not present

## 2020-01-30 DIAGNOSIS — K56609 Unspecified intestinal obstruction, unspecified as to partial versus complete obstruction: Secondary | ICD-10-CM | POA: Diagnosis not present

## 2020-01-30 DIAGNOSIS — C182 Malignant neoplasm of ascending colon: Secondary | ICD-10-CM | POA: Diagnosis not present

## 2020-01-30 DIAGNOSIS — E876 Hypokalemia: Secondary | ICD-10-CM | POA: Diagnosis not present

## 2020-01-30 DIAGNOSIS — A419 Sepsis, unspecified organism: Secondary | ICD-10-CM | POA: Diagnosis not present

## 2020-01-30 DIAGNOSIS — R2689 Other abnormalities of gait and mobility: Secondary | ICD-10-CM | POA: Diagnosis not present

## 2020-01-30 DIAGNOSIS — R1010 Upper abdominal pain, unspecified: Secondary | ICD-10-CM | POA: Diagnosis not present

## 2020-01-30 DIAGNOSIS — E1122 Type 2 diabetes mellitus with diabetic chronic kidney disease: Secondary | ICD-10-CM | POA: Diagnosis not present

## 2020-01-30 DIAGNOSIS — I959 Hypotension, unspecified: Secondary | ICD-10-CM | POA: Diagnosis not present

## 2020-01-30 DIAGNOSIS — S069X9A Unspecified intracranial injury with loss of consciousness of unspecified duration, initial encounter: Secondary | ICD-10-CM | POA: Diagnosis not present

## 2020-01-30 DIAGNOSIS — D696 Thrombocytopenia, unspecified: Secondary | ICD-10-CM | POA: Diagnosis not present

## 2020-01-30 DIAGNOSIS — K30 Functional dyspepsia: Secondary | ICD-10-CM | POA: Diagnosis not present

## 2020-01-30 DIAGNOSIS — J1282 Pneumonia due to coronavirus disease 2019: Secondary | ICD-10-CM | POA: Diagnosis not present

## 2020-01-30 DIAGNOSIS — E1165 Type 2 diabetes mellitus with hyperglycemia: Secondary | ICD-10-CM | POA: Diagnosis not present

## 2020-01-30 DIAGNOSIS — Z90710 Acquired absence of both cervix and uterus: Secondary | ICD-10-CM | POA: Diagnosis not present

## 2020-01-30 DIAGNOSIS — R8271 Bacteriuria: Secondary | ICD-10-CM | POA: Diagnosis not present

## 2020-01-30 DIAGNOSIS — Z79891 Long term (current) use of opiate analgesic: Secondary | ICD-10-CM | POA: Diagnosis not present

## 2020-01-30 DIAGNOSIS — F329 Major depressive disorder, single episode, unspecified: Secondary | ICD-10-CM | POA: Diagnosis not present

## 2020-01-30 DIAGNOSIS — E872 Acidosis: Secondary | ICD-10-CM | POA: Diagnosis not present

## 2020-01-30 DIAGNOSIS — K760 Fatty (change of) liver, not elsewhere classified: Secondary | ICD-10-CM | POA: Diagnosis not present

## 2020-01-30 DIAGNOSIS — I700 Atherosclerosis of aorta: Secondary | ICD-10-CM | POA: Diagnosis not present

## 2020-01-30 DIAGNOSIS — G8929 Other chronic pain: Secondary | ICD-10-CM | POA: Diagnosis not present

## 2020-01-30 DIAGNOSIS — K5651 Intestinal adhesions [bands], with partial obstruction: Secondary | ICD-10-CM | POA: Diagnosis not present

## 2020-01-30 DIAGNOSIS — G629 Polyneuropathy, unspecified: Secondary | ICD-10-CM | POA: Diagnosis not present

## 2020-01-30 DIAGNOSIS — E860 Dehydration: Secondary | ICD-10-CM | POA: Diagnosis not present

## 2020-01-30 DIAGNOSIS — J449 Chronic obstructive pulmonary disease, unspecified: Secondary | ICD-10-CM | POA: Diagnosis not present

## 2020-01-30 DIAGNOSIS — B370 Candidal stomatitis: Secondary | ICD-10-CM | POA: Diagnosis not present

## 2020-01-30 DIAGNOSIS — R000 Tachycardia, unspecified: Secondary | ICD-10-CM | POA: Diagnosis not present

## 2020-01-30 DIAGNOSIS — J9601 Acute respiratory failure with hypoxia: Secondary | ICD-10-CM | POA: Diagnosis not present

## 2020-01-30 DIAGNOSIS — N179 Acute kidney failure, unspecified: Secondary | ICD-10-CM | POA: Diagnosis not present

## 2020-01-30 DIAGNOSIS — R161 Splenomegaly, not elsewhere classified: Secondary | ICD-10-CM | POA: Diagnosis not present

## 2020-01-30 DIAGNOSIS — R079 Chest pain, unspecified: Secondary | ICD-10-CM | POA: Diagnosis not present

## 2020-01-30 DIAGNOSIS — G4700 Insomnia, unspecified: Secondary | ICD-10-CM | POA: Diagnosis not present

## 2020-01-30 DIAGNOSIS — S301XXD Contusion of abdominal wall, subsequent encounter: Secondary | ICD-10-CM | POA: Diagnosis not present

## 2020-01-30 DIAGNOSIS — K56600 Partial intestinal obstruction, unspecified as to cause: Secondary | ICD-10-CM | POA: Diagnosis not present

## 2020-01-30 DIAGNOSIS — M1990 Unspecified osteoarthritis, unspecified site: Secondary | ICD-10-CM | POA: Diagnosis not present

## 2020-01-30 DIAGNOSIS — K449 Diaphragmatic hernia without obstruction or gangrene: Secondary | ICD-10-CM | POA: Diagnosis not present

## 2020-01-30 DIAGNOSIS — D6959 Other secondary thrombocytopenia: Secondary | ICD-10-CM | POA: Diagnosis not present

## 2020-01-30 DIAGNOSIS — K7460 Unspecified cirrhosis of liver: Secondary | ICD-10-CM | POA: Diagnosis not present

## 2020-01-30 DIAGNOSIS — R109 Unspecified abdominal pain: Secondary | ICD-10-CM | POA: Diagnosis not present

## 2020-01-30 DIAGNOSIS — J40 Bronchitis, not specified as acute or chronic: Secondary | ICD-10-CM | POA: Diagnosis not present

## 2020-01-30 DIAGNOSIS — K529 Noninfective gastroenteritis and colitis, unspecified: Secondary | ICD-10-CM | POA: Diagnosis not present

## 2020-01-30 DIAGNOSIS — S301XXA Contusion of abdominal wall, initial encounter: Secondary | ICD-10-CM | POA: Diagnosis not present

## 2020-01-30 DIAGNOSIS — F411 Generalized anxiety disorder: Secondary | ICD-10-CM | POA: Diagnosis not present

## 2020-01-30 DIAGNOSIS — I639 Cerebral infarction, unspecified: Secondary | ICD-10-CM | POA: Diagnosis not present

## 2020-01-30 DIAGNOSIS — R739 Hyperglycemia, unspecified: Secondary | ICD-10-CM | POA: Diagnosis not present

## 2020-01-30 DIAGNOSIS — E871 Hypo-osmolality and hyponatremia: Secondary | ICD-10-CM | POA: Diagnosis not present

## 2020-01-30 DIAGNOSIS — R1030 Lower abdominal pain, unspecified: Secondary | ICD-10-CM | POA: Diagnosis not present

## 2020-01-30 DIAGNOSIS — I5042 Chronic combined systolic (congestive) and diastolic (congestive) heart failure: Secondary | ICD-10-CM | POA: Diagnosis not present

## 2020-01-30 DIAGNOSIS — T380X5A Adverse effect of glucocorticoids and synthetic analogues, initial encounter: Secondary | ICD-10-CM | POA: Diagnosis not present

## 2020-01-30 DIAGNOSIS — J069 Acute upper respiratory infection, unspecified: Secondary | ICD-10-CM | POA: Diagnosis not present

## 2020-01-30 DIAGNOSIS — F332 Major depressive disorder, recurrent severe without psychotic features: Secondary | ICD-10-CM | POA: Diagnosis not present

## 2020-01-30 DIAGNOSIS — R7989 Other specified abnormal findings of blood chemistry: Secondary | ICD-10-CM | POA: Diagnosis not present

## 2020-01-30 DIAGNOSIS — R197 Diarrhea, unspecified: Secondary | ICD-10-CM | POA: Diagnosis not present

## 2020-01-30 DIAGNOSIS — R188 Other ascites: Secondary | ICD-10-CM | POA: Diagnosis not present

## 2020-01-30 DIAGNOSIS — J9600 Acute respiratory failure, unspecified whether with hypoxia or hypercapnia: Secondary | ICD-10-CM | POA: Diagnosis not present

## 2020-01-30 DIAGNOSIS — I10 Essential (primary) hypertension: Secondary | ICD-10-CM | POA: Diagnosis not present

## 2020-01-30 DIAGNOSIS — J189 Pneumonia, unspecified organism: Secondary | ICD-10-CM | POA: Diagnosis not present

## 2020-01-30 DIAGNOSIS — K5720 Diverticulitis of large intestine with perforation and abscess without bleeding: Secondary | ICD-10-CM | POA: Diagnosis not present

## 2020-01-30 DIAGNOSIS — K219 Gastro-esophageal reflux disease without esophagitis: Secondary | ICD-10-CM | POA: Diagnosis not present

## 2020-01-30 DIAGNOSIS — I4510 Unspecified right bundle-branch block: Secondary | ICD-10-CM | POA: Diagnosis not present

## 2020-01-30 DIAGNOSIS — R296 Repeated falls: Secondary | ICD-10-CM | POA: Diagnosis not present

## 2020-01-30 DIAGNOSIS — Z9884 Bariatric surgery status: Secondary | ICD-10-CM | POA: Diagnosis not present

## 2020-01-30 DIAGNOSIS — Z6841 Body Mass Index (BMI) 40.0 and over, adult: Secondary | ICD-10-CM | POA: Diagnosis not present

## 2020-01-30 DIAGNOSIS — D61818 Other pancytopenia: Secondary | ICD-10-CM | POA: Diagnosis not present

## 2020-01-30 DIAGNOSIS — D62 Acute posthemorrhagic anemia: Secondary | ICD-10-CM | POA: Diagnosis not present

## 2020-01-30 DIAGNOSIS — I2510 Atherosclerotic heart disease of native coronary artery without angina pectoris: Secondary | ICD-10-CM | POA: Diagnosis not present

## 2020-01-30 DIAGNOSIS — A4189 Other specified sepsis: Secondary | ICD-10-CM | POA: Diagnosis not present

## 2020-01-30 DIAGNOSIS — R0602 Shortness of breath: Secondary | ICD-10-CM | POA: Diagnosis not present

## 2020-01-30 DIAGNOSIS — M5136 Other intervertebral disc degeneration, lumbar region: Secondary | ICD-10-CM | POA: Diagnosis not present

## 2020-01-30 DIAGNOSIS — I130 Hypertensive heart and chronic kidney disease with heart failure and stage 1 through stage 4 chronic kidney disease, or unspecified chronic kidney disease: Secondary | ICD-10-CM | POA: Diagnosis not present

## 2020-01-30 DIAGNOSIS — Z20822 Contact with and (suspected) exposure to covid-19: Secondary | ICD-10-CM | POA: Diagnosis not present

## 2020-01-30 DIAGNOSIS — N184 Chronic kidney disease, stage 4 (severe): Secondary | ICD-10-CM | POA: Diagnosis not present

## 2020-01-30 DIAGNOSIS — N390 Urinary tract infection, site not specified: Secondary | ICD-10-CM | POA: Diagnosis not present

## 2020-01-30 DIAGNOSIS — E1140 Type 2 diabetes mellitus with diabetic neuropathy, unspecified: Secondary | ICD-10-CM | POA: Diagnosis not present

## 2020-01-30 DIAGNOSIS — F419 Anxiety disorder, unspecified: Secondary | ICD-10-CM | POA: Diagnosis not present

## 2020-01-31 DIAGNOSIS — I69398 Other sequelae of cerebral infarction: Secondary | ICD-10-CM | POA: Diagnosis not present

## 2020-01-31 DIAGNOSIS — D500 Iron deficiency anemia secondary to blood loss (chronic): Secondary | ICD-10-CM | POA: Diagnosis not present

## 2020-01-31 DIAGNOSIS — K5700 Diverticulitis of small intestine with perforation and abscess without bleeding: Secondary | ICD-10-CM | POA: Diagnosis not present

## 2020-01-31 DIAGNOSIS — I951 Orthostatic hypotension: Secondary | ICD-10-CM | POA: Diagnosis not present

## 2020-01-31 DIAGNOSIS — M7581 Other shoulder lesions, right shoulder: Secondary | ICD-10-CM | POA: Diagnosis not present

## 2020-01-31 DIAGNOSIS — I4819 Other persistent atrial fibrillation: Secondary | ICD-10-CM | POA: Diagnosis not present

## 2020-01-31 DIAGNOSIS — M25541 Pain in joints of right hand: Secondary | ICD-10-CM | POA: Diagnosis not present

## 2020-01-31 DIAGNOSIS — E8809 Other disorders of plasma-protein metabolism, not elsewhere classified: Secondary | ICD-10-CM | POA: Diagnosis not present

## 2020-01-31 DIAGNOSIS — R002 Palpitations: Secondary | ICD-10-CM | POA: Diagnosis not present

## 2020-01-31 DIAGNOSIS — L608 Other nail disorders: Secondary | ICD-10-CM | POA: Diagnosis not present

## 2020-01-31 DIAGNOSIS — I5023 Acute on chronic systolic (congestive) heart failure: Secondary | ICD-10-CM | POA: Diagnosis not present

## 2020-01-31 DIAGNOSIS — M6281 Muscle weakness (generalized): Secondary | ICD-10-CM | POA: Diagnosis not present

## 2020-01-31 DIAGNOSIS — M5431 Sciatica, right side: Secondary | ICD-10-CM | POA: Diagnosis not present

## 2020-01-31 DIAGNOSIS — Z23 Encounter for immunization: Secondary | ICD-10-CM | POA: Diagnosis not present

## 2020-01-31 DIAGNOSIS — Z888 Allergy status to other drugs, medicaments and biological substances status: Secondary | ICD-10-CM | POA: Diagnosis not present

## 2020-01-31 DIAGNOSIS — J3089 Other allergic rhinitis: Secondary | ICD-10-CM | POA: Diagnosis not present

## 2020-01-31 DIAGNOSIS — M5432 Sciatica, left side: Secondary | ICD-10-CM | POA: Diagnosis not present

## 2020-01-31 DIAGNOSIS — G459 Transient cerebral ischemic attack, unspecified: Secondary | ICD-10-CM | POA: Diagnosis not present

## 2020-01-31 DIAGNOSIS — L738 Other specified follicular disorders: Secondary | ICD-10-CM | POA: Diagnosis not present

## 2020-01-31 DIAGNOSIS — M50123 Cervical disc disorder at C6-C7 level with radiculopathy: Secondary | ICD-10-CM | POA: Diagnosis not present

## 2020-01-31 DIAGNOSIS — L821 Other seborrheic keratosis: Secondary | ICD-10-CM | POA: Diagnosis not present

## 2020-01-31 DIAGNOSIS — I5032 Chronic diastolic (congestive) heart failure: Secondary | ICD-10-CM | POA: Diagnosis not present

## 2020-01-31 DIAGNOSIS — A4181 Sepsis due to Enterococcus: Secondary | ICD-10-CM | POA: Diagnosis not present

## 2020-01-31 DIAGNOSIS — M810 Age-related osteoporosis without current pathological fracture: Secondary | ICD-10-CM | POA: Diagnosis not present

## 2020-01-31 DIAGNOSIS — R32 Unspecified urinary incontinence: Secondary | ICD-10-CM | POA: Diagnosis not present

## 2020-01-31 DIAGNOSIS — W19XXXA Unspecified fall, initial encounter: Secondary | ICD-10-CM | POA: Diagnosis not present

## 2020-01-31 DIAGNOSIS — R040 Epistaxis: Secondary | ICD-10-CM | POA: Diagnosis not present

## 2020-01-31 DIAGNOSIS — J449 Chronic obstructive pulmonary disease, unspecified: Secondary | ICD-10-CM | POA: Diagnosis not present

## 2020-01-31 DIAGNOSIS — M5134 Other intervertebral disc degeneration, thoracic region: Secondary | ICD-10-CM | POA: Diagnosis not present

## 2020-01-31 DIAGNOSIS — C44219 Basal cell carcinoma of skin of left ear and external auricular canal: Secondary | ICD-10-CM | POA: Diagnosis not present

## 2020-01-31 DIAGNOSIS — Z86010 Personal history of colonic polyps: Secondary | ICD-10-CM | POA: Diagnosis not present

## 2020-01-31 DIAGNOSIS — M62521 Muscle wasting and atrophy, not elsewhere classified, right upper arm: Secondary | ICD-10-CM | POA: Diagnosis not present

## 2020-01-31 DIAGNOSIS — M4716 Other spondylosis with myelopathy, lumbar region: Secondary | ICD-10-CM | POA: Diagnosis not present

## 2020-01-31 DIAGNOSIS — R509 Fever, unspecified: Secondary | ICD-10-CM | POA: Diagnosis not present

## 2020-01-31 DIAGNOSIS — Z96652 Presence of left artificial knee joint: Secondary | ICD-10-CM | POA: Diagnosis not present

## 2020-01-31 DIAGNOSIS — Z03818 Encounter for observation for suspected exposure to other biological agents ruled out: Secondary | ICD-10-CM | POA: Diagnosis not present

## 2020-01-31 DIAGNOSIS — L719 Rosacea, unspecified: Secondary | ICD-10-CM | POA: Diagnosis not present

## 2020-01-31 DIAGNOSIS — Z743 Need for continuous supervision: Secondary | ICD-10-CM | POA: Diagnosis not present

## 2020-01-31 DIAGNOSIS — L720 Epidermal cyst: Secondary | ICD-10-CM | POA: Diagnosis not present

## 2020-01-31 DIAGNOSIS — J84112 Idiopathic pulmonary fibrosis: Secondary | ICD-10-CM | POA: Diagnosis not present

## 2020-01-31 DIAGNOSIS — R600 Localized edema: Secondary | ICD-10-CM | POA: Diagnosis not present

## 2020-01-31 DIAGNOSIS — S52125D Nondisplaced fracture of head of left radius, subsequent encounter for closed fracture with routine healing: Secondary | ICD-10-CM | POA: Diagnosis not present

## 2020-01-31 DIAGNOSIS — R1033 Periumbilical pain: Secondary | ICD-10-CM | POA: Diagnosis not present

## 2020-01-31 DIAGNOSIS — N200 Calculus of kidney: Secondary | ICD-10-CM | POA: Diagnosis not present

## 2020-01-31 DIAGNOSIS — D225 Melanocytic nevi of trunk: Secondary | ICD-10-CM | POA: Diagnosis not present

## 2020-01-31 DIAGNOSIS — F0280 Dementia in other diseases classified elsewhere without behavioral disturbance: Secondary | ICD-10-CM | POA: Diagnosis not present

## 2020-01-31 DIAGNOSIS — F5105 Insomnia due to other mental disorder: Secondary | ICD-10-CM | POA: Diagnosis not present

## 2020-01-31 DIAGNOSIS — I330 Acute and subacute infective endocarditis: Secondary | ICD-10-CM | POA: Diagnosis not present

## 2020-01-31 DIAGNOSIS — S0003XA Contusion of scalp, initial encounter: Secondary | ICD-10-CM | POA: Diagnosis not present

## 2020-01-31 DIAGNOSIS — J4520 Mild intermittent asthma, uncomplicated: Secondary | ICD-10-CM | POA: Diagnosis not present

## 2020-01-31 DIAGNOSIS — R7989 Other specified abnormal findings of blood chemistry: Secondary | ICD-10-CM | POA: Diagnosis not present

## 2020-01-31 DIAGNOSIS — R260 Ataxic gait: Secondary | ICD-10-CM | POA: Diagnosis not present

## 2020-01-31 DIAGNOSIS — F331 Major depressive disorder, recurrent, moderate: Secondary | ICD-10-CM | POA: Diagnosis not present

## 2020-01-31 DIAGNOSIS — Z85828 Personal history of other malignant neoplasm of skin: Secondary | ICD-10-CM | POA: Diagnosis not present

## 2020-01-31 DIAGNOSIS — M1991 Primary osteoarthritis, unspecified site: Secondary | ICD-10-CM | POA: Diagnosis not present

## 2020-01-31 DIAGNOSIS — Z7982 Long term (current) use of aspirin: Secondary | ICD-10-CM | POA: Diagnosis not present

## 2020-01-31 DIAGNOSIS — M25461 Effusion, right knee: Secondary | ICD-10-CM | POA: Diagnosis not present

## 2020-01-31 DIAGNOSIS — Z9181 History of falling: Secondary | ICD-10-CM | POA: Diagnosis not present

## 2020-01-31 DIAGNOSIS — F17210 Nicotine dependence, cigarettes, uncomplicated: Secondary | ICD-10-CM | POA: Diagnosis not present

## 2020-01-31 DIAGNOSIS — M816 Localized osteoporosis [Lequesne]: Secondary | ICD-10-CM | POA: Diagnosis not present

## 2020-01-31 DIAGNOSIS — D485 Neoplasm of uncertain behavior of skin: Secondary | ICD-10-CM | POA: Diagnosis not present

## 2020-01-31 DIAGNOSIS — Z7901 Long term (current) use of anticoagulants: Secondary | ICD-10-CM | POA: Diagnosis not present

## 2020-01-31 DIAGNOSIS — H4311 Vitreous hemorrhage, right eye: Secondary | ICD-10-CM | POA: Diagnosis not present

## 2020-01-31 DIAGNOSIS — D2271 Melanocytic nevi of right lower limb, including hip: Secondary | ICD-10-CM | POA: Diagnosis not present

## 2020-01-31 DIAGNOSIS — I472 Ventricular tachycardia: Secondary | ICD-10-CM | POA: Diagnosis not present

## 2020-01-31 DIAGNOSIS — S82852A Displaced trimalleolar fracture of left lower leg, initial encounter for closed fracture: Secondary | ICD-10-CM | POA: Diagnosis not present

## 2020-01-31 DIAGNOSIS — Z1211 Encounter for screening for malignant neoplasm of colon: Secondary | ICD-10-CM | POA: Diagnosis not present

## 2020-01-31 DIAGNOSIS — N138 Other obstructive and reflux uropathy: Secondary | ICD-10-CM | POA: Diagnosis not present

## 2020-01-31 DIAGNOSIS — S8991XA Unspecified injury of right lower leg, initial encounter: Secondary | ICD-10-CM | POA: Diagnosis not present

## 2020-01-31 DIAGNOSIS — G43019 Migraine without aura, intractable, without status migrainosus: Secondary | ICD-10-CM | POA: Diagnosis not present

## 2020-01-31 DIAGNOSIS — F4321 Adjustment disorder with depressed mood: Secondary | ICD-10-CM | POA: Diagnosis not present

## 2020-01-31 DIAGNOSIS — M1711 Unilateral primary osteoarthritis, right knee: Secondary | ICD-10-CM | POA: Diagnosis not present

## 2020-01-31 DIAGNOSIS — G629 Polyneuropathy, unspecified: Secondary | ICD-10-CM | POA: Diagnosis not present

## 2020-01-31 DIAGNOSIS — D2261 Melanocytic nevi of right upper limb, including shoulder: Secondary | ICD-10-CM | POA: Diagnosis not present

## 2020-01-31 DIAGNOSIS — M25561 Pain in right knee: Secondary | ICD-10-CM | POA: Diagnosis not present

## 2020-01-31 DIAGNOSIS — K9589 Other complications of other bariatric procedure: Secondary | ICD-10-CM | POA: Diagnosis not present

## 2020-01-31 DIAGNOSIS — M79652 Pain in left thigh: Secondary | ICD-10-CM | POA: Diagnosis not present

## 2020-01-31 DIAGNOSIS — L309 Dermatitis, unspecified: Secondary | ICD-10-CM | POA: Diagnosis not present

## 2020-01-31 DIAGNOSIS — D72829 Elevated white blood cell count, unspecified: Secondary | ICD-10-CM | POA: Diagnosis not present

## 2020-01-31 DIAGNOSIS — Q613 Polycystic kidney, unspecified: Secondary | ICD-10-CM | POA: Diagnosis not present

## 2020-01-31 DIAGNOSIS — Z0000 Encounter for general adult medical examination without abnormal findings: Secondary | ICD-10-CM | POA: Diagnosis not present

## 2020-01-31 DIAGNOSIS — Z9884 Bariatric surgery status: Secondary | ICD-10-CM | POA: Diagnosis not present

## 2020-01-31 DIAGNOSIS — Z6838 Body mass index (BMI) 38.0-38.9, adult: Secondary | ICD-10-CM | POA: Diagnosis not present

## 2020-01-31 DIAGNOSIS — M797 Fibromyalgia: Secondary | ICD-10-CM | POA: Diagnosis not present

## 2020-01-31 DIAGNOSIS — C61 Malignant neoplasm of prostate: Secondary | ICD-10-CM | POA: Diagnosis not present

## 2020-01-31 DIAGNOSIS — Z08 Encounter for follow-up examination after completed treatment for malignant neoplasm: Secondary | ICD-10-CM | POA: Diagnosis not present

## 2020-01-31 DIAGNOSIS — R296 Repeated falls: Secondary | ICD-10-CM | POA: Diagnosis not present

## 2020-01-31 DIAGNOSIS — J8410 Pulmonary fibrosis, unspecified: Secondary | ICD-10-CM | POA: Diagnosis not present

## 2020-01-31 DIAGNOSIS — D2272 Melanocytic nevi of left lower limb, including hip: Secondary | ICD-10-CM | POA: Diagnosis not present

## 2020-01-31 DIAGNOSIS — R5383 Other fatigue: Secondary | ICD-10-CM | POA: Diagnosis not present

## 2020-01-31 DIAGNOSIS — R030 Elevated blood-pressure reading, without diagnosis of hypertension: Secondary | ICD-10-CM | POA: Diagnosis not present

## 2020-01-31 DIAGNOSIS — M542 Cervicalgia: Secondary | ICD-10-CM | POA: Diagnosis not present

## 2020-01-31 DIAGNOSIS — Z1331 Encounter for screening for depression: Secondary | ICD-10-CM | POA: Diagnosis not present

## 2020-01-31 DIAGNOSIS — G8929 Other chronic pain: Secondary | ICD-10-CM | POA: Diagnosis not present

## 2020-01-31 DIAGNOSIS — F5101 Primary insomnia: Secondary | ICD-10-CM | POA: Diagnosis not present

## 2020-01-31 DIAGNOSIS — N3281 Overactive bladder: Secondary | ICD-10-CM | POA: Diagnosis not present

## 2020-01-31 DIAGNOSIS — M170 Bilateral primary osteoarthritis of knee: Secondary | ICD-10-CM | POA: Diagnosis not present

## 2020-01-31 DIAGNOSIS — J9601 Acute respiratory failure with hypoxia: Secondary | ICD-10-CM | POA: Diagnosis not present

## 2020-01-31 DIAGNOSIS — M25542 Pain in joints of left hand: Secondary | ICD-10-CM | POA: Diagnosis not present

## 2020-01-31 DIAGNOSIS — I071 Rheumatic tricuspid insufficiency: Secondary | ICD-10-CM | POA: Diagnosis not present

## 2020-01-31 DIAGNOSIS — E1169 Type 2 diabetes mellitus with other specified complication: Secondary | ICD-10-CM | POA: Diagnosis not present

## 2020-01-31 DIAGNOSIS — N186 End stage renal disease: Secondary | ICD-10-CM | POA: Diagnosis not present

## 2020-01-31 DIAGNOSIS — R2689 Other abnormalities of gait and mobility: Secondary | ICD-10-CM | POA: Diagnosis not present

## 2020-01-31 DIAGNOSIS — R6884 Jaw pain: Secondary | ICD-10-CM | POA: Diagnosis not present

## 2020-01-31 DIAGNOSIS — I160 Hypertensive urgency: Secondary | ICD-10-CM | POA: Diagnosis not present

## 2020-01-31 DIAGNOSIS — N951 Menopausal and female climacteric states: Secondary | ICD-10-CM | POA: Diagnosis not present

## 2020-01-31 DIAGNOSIS — Z792 Long term (current) use of antibiotics: Secondary | ICD-10-CM | POA: Diagnosis not present

## 2020-01-31 DIAGNOSIS — M47812 Spondylosis without myelopathy or radiculopathy, cervical region: Secondary | ICD-10-CM | POA: Diagnosis not present

## 2020-01-31 DIAGNOSIS — C44319 Basal cell carcinoma of skin of other parts of face: Secondary | ICD-10-CM | POA: Diagnosis not present

## 2020-01-31 DIAGNOSIS — S52122D Displaced fracture of head of left radius, subsequent encounter for closed fracture with routine healing: Secondary | ICD-10-CM | POA: Diagnosis not present

## 2020-01-31 DIAGNOSIS — M545 Low back pain: Secondary | ICD-10-CM | POA: Diagnosis not present

## 2020-01-31 DIAGNOSIS — R739 Hyperglycemia, unspecified: Secondary | ICD-10-CM | POA: Diagnosis not present

## 2020-01-31 DIAGNOSIS — K660 Peritoneal adhesions (postprocedural) (postinfection): Secondary | ICD-10-CM | POA: Diagnosis not present

## 2020-01-31 DIAGNOSIS — R339 Retention of urine, unspecified: Secondary | ICD-10-CM | POA: Diagnosis not present

## 2020-01-31 DIAGNOSIS — K2101 Gastro-esophageal reflux disease with esophagitis, with bleeding: Secondary | ICD-10-CM | POA: Diagnosis not present

## 2020-01-31 DIAGNOSIS — E11649 Type 2 diabetes mellitus with hypoglycemia without coma: Secondary | ICD-10-CM | POA: Diagnosis not present

## 2020-01-31 DIAGNOSIS — H401131 Primary open-angle glaucoma, bilateral, mild stage: Secondary | ICD-10-CM | POA: Diagnosis not present

## 2020-01-31 DIAGNOSIS — N1831 Chronic kidney disease, stage 3a: Secondary | ICD-10-CM | POA: Diagnosis not present

## 2020-01-31 DIAGNOSIS — Z86718 Personal history of other venous thrombosis and embolism: Secondary | ICD-10-CM | POA: Diagnosis not present

## 2020-01-31 DIAGNOSIS — E782 Mixed hyperlipidemia: Secondary | ICD-10-CM | POA: Diagnosis not present

## 2020-01-31 DIAGNOSIS — D692 Other nonthrombocytopenic purpura: Secondary | ICD-10-CM | POA: Diagnosis not present

## 2020-01-31 DIAGNOSIS — H409 Unspecified glaucoma: Secondary | ICD-10-CM | POA: Diagnosis not present

## 2020-01-31 DIAGNOSIS — C50912 Malignant neoplasm of unspecified site of left female breast: Secondary | ICD-10-CM | POA: Diagnosis not present

## 2020-01-31 DIAGNOSIS — I25110 Atherosclerotic heart disease of native coronary artery with unstable angina pectoris: Secondary | ICD-10-CM | POA: Diagnosis not present

## 2020-01-31 DIAGNOSIS — M25562 Pain in left knee: Secondary | ICD-10-CM | POA: Diagnosis not present

## 2020-01-31 DIAGNOSIS — J329 Chronic sinusitis, unspecified: Secondary | ICD-10-CM | POA: Diagnosis not present

## 2020-01-31 DIAGNOSIS — M4326 Fusion of spine, lumbar region: Secondary | ICD-10-CM | POA: Diagnosis not present

## 2020-01-31 DIAGNOSIS — M7051 Other bursitis of knee, right knee: Secondary | ICD-10-CM | POA: Diagnosis not present

## 2020-01-31 DIAGNOSIS — E1165 Type 2 diabetes mellitus with hyperglycemia: Secondary | ICD-10-CM | POA: Diagnosis not present

## 2020-01-31 DIAGNOSIS — J470 Bronchiectasis with acute lower respiratory infection: Secondary | ICD-10-CM | POA: Diagnosis not present

## 2020-01-31 DIAGNOSIS — K219 Gastro-esophageal reflux disease without esophagitis: Secondary | ICD-10-CM | POA: Diagnosis not present

## 2020-01-31 DIAGNOSIS — H814 Vertigo of central origin: Secondary | ICD-10-CM | POA: Diagnosis not present

## 2020-01-31 DIAGNOSIS — R350 Frequency of micturition: Secondary | ICD-10-CM | POA: Diagnosis not present

## 2020-01-31 DIAGNOSIS — Z85820 Personal history of malignant melanoma of skin: Secondary | ICD-10-CM | POA: Diagnosis not present

## 2020-01-31 DIAGNOSIS — C50919 Malignant neoplasm of unspecified site of unspecified female breast: Secondary | ICD-10-CM | POA: Diagnosis not present

## 2020-01-31 DIAGNOSIS — F419 Anxiety disorder, unspecified: Secondary | ICD-10-CM | POA: Diagnosis not present

## 2020-01-31 DIAGNOSIS — J069 Acute upper respiratory infection, unspecified: Secondary | ICD-10-CM | POA: Diagnosis not present

## 2020-01-31 DIAGNOSIS — T8484XA Pain due to internal orthopedic prosthetic devices, implants and grafts, initial encounter: Secondary | ICD-10-CM | POA: Diagnosis not present

## 2020-01-31 DIAGNOSIS — Z96651 Presence of right artificial knee joint: Secondary | ICD-10-CM | POA: Diagnosis not present

## 2020-01-31 DIAGNOSIS — C220 Liver cell carcinoma: Secondary | ICD-10-CM | POA: Diagnosis not present

## 2020-01-31 DIAGNOSIS — F209 Schizophrenia, unspecified: Secondary | ICD-10-CM | POA: Diagnosis not present

## 2020-01-31 DIAGNOSIS — R58 Hemorrhage, not elsewhere classified: Secondary | ICD-10-CM | POA: Diagnosis not present

## 2020-01-31 DIAGNOSIS — B965 Pseudomonas (aeruginosa) (mallei) (pseudomallei) as the cause of diseases classified elsewhere: Secondary | ICD-10-CM | POA: Diagnosis not present

## 2020-01-31 DIAGNOSIS — Z1159 Encounter for screening for other viral diseases: Secondary | ICD-10-CM | POA: Diagnosis not present

## 2020-01-31 DIAGNOSIS — N401 Enlarged prostate with lower urinary tract symptoms: Secondary | ICD-10-CM | POA: Diagnosis not present

## 2020-01-31 DIAGNOSIS — I129 Hypertensive chronic kidney disease with stage 1 through stage 4 chronic kidney disease, or unspecified chronic kidney disease: Secondary | ICD-10-CM | POA: Diagnosis not present

## 2020-01-31 DIAGNOSIS — N182 Chronic kidney disease, stage 2 (mild): Secondary | ICD-10-CM | POA: Diagnosis not present

## 2020-01-31 DIAGNOSIS — I259 Chronic ischemic heart disease, unspecified: Secondary | ICD-10-CM | POA: Diagnosis not present

## 2020-01-31 DIAGNOSIS — S42201A Unspecified fracture of upper end of right humerus, initial encounter for closed fracture: Secondary | ICD-10-CM | POA: Diagnosis not present

## 2020-01-31 DIAGNOSIS — Z9889 Other specified postprocedural states: Secondary | ICD-10-CM | POA: Diagnosis not present

## 2020-01-31 DIAGNOSIS — K811 Chronic cholecystitis: Secondary | ICD-10-CM | POA: Diagnosis not present

## 2020-01-31 DIAGNOSIS — R278 Other lack of coordination: Secondary | ICD-10-CM | POA: Diagnosis not present

## 2020-01-31 DIAGNOSIS — J302 Other seasonal allergic rhinitis: Secondary | ICD-10-CM | POA: Diagnosis not present

## 2020-01-31 DIAGNOSIS — D869 Sarcoidosis, unspecified: Secondary | ICD-10-CM | POA: Diagnosis not present

## 2020-01-31 DIAGNOSIS — Z170 Estrogen receptor positive status [ER+]: Secondary | ICD-10-CM | POA: Diagnosis not present

## 2020-01-31 DIAGNOSIS — Z794 Long term (current) use of insulin: Secondary | ICD-10-CM | POA: Diagnosis not present

## 2020-01-31 DIAGNOSIS — N50819 Testicular pain, unspecified: Secondary | ICD-10-CM | POA: Diagnosis not present

## 2020-01-31 DIAGNOSIS — N179 Acute kidney failure, unspecified: Secondary | ICD-10-CM | POA: Diagnosis not present

## 2020-01-31 DIAGNOSIS — D2339 Other benign neoplasm of skin of other parts of face: Secondary | ICD-10-CM | POA: Diagnosis not present

## 2020-01-31 DIAGNOSIS — Z1212 Encounter for screening for malignant neoplasm of rectum: Secondary | ICD-10-CM | POA: Diagnosis not present

## 2020-01-31 DIAGNOSIS — M25511 Pain in right shoulder: Secondary | ICD-10-CM | POA: Diagnosis not present

## 2020-01-31 DIAGNOSIS — D638 Anemia in other chronic diseases classified elsewhere: Secondary | ICD-10-CM | POA: Diagnosis not present

## 2020-01-31 DIAGNOSIS — I739 Peripheral vascular disease, unspecified: Secondary | ICD-10-CM | POA: Diagnosis not present

## 2020-01-31 DIAGNOSIS — Z950 Presence of cardiac pacemaker: Secondary | ICD-10-CM | POA: Diagnosis not present

## 2020-01-31 DIAGNOSIS — I447 Left bundle-branch block, unspecified: Secondary | ICD-10-CM | POA: Diagnosis not present

## 2020-01-31 DIAGNOSIS — M5489 Other dorsalgia: Secondary | ICD-10-CM | POA: Diagnosis not present

## 2020-01-31 DIAGNOSIS — Z6831 Body mass index (BMI) 31.0-31.9, adult: Secondary | ICD-10-CM | POA: Diagnosis not present

## 2020-01-31 DIAGNOSIS — R319 Hematuria, unspecified: Secondary | ICD-10-CM | POA: Diagnosis not present

## 2020-01-31 DIAGNOSIS — M25661 Stiffness of right knee, not elsewhere classified: Secondary | ICD-10-CM | POA: Diagnosis not present

## 2020-01-31 DIAGNOSIS — I6389 Other cerebral infarction: Secondary | ICD-10-CM | POA: Diagnosis not present

## 2020-01-31 DIAGNOSIS — B353 Tinea pedis: Secondary | ICD-10-CM | POA: Diagnosis not present

## 2020-01-31 DIAGNOSIS — M4856XD Collapsed vertebra, not elsewhere classified, lumbar region, subsequent encounter for fracture with routine healing: Secondary | ICD-10-CM | POA: Diagnosis not present

## 2020-01-31 DIAGNOSIS — R52 Pain, unspecified: Secondary | ICD-10-CM | POA: Diagnosis not present

## 2020-01-31 DIAGNOSIS — R944 Abnormal results of kidney function studies: Secondary | ICD-10-CM | POA: Diagnosis not present

## 2020-01-31 DIAGNOSIS — H40013 Open angle with borderline findings, low risk, bilateral: Secondary | ICD-10-CM | POA: Diagnosis not present

## 2020-01-31 DIAGNOSIS — R531 Weakness: Secondary | ICD-10-CM | POA: Diagnosis not present

## 2020-01-31 DIAGNOSIS — E1140 Type 2 diabetes mellitus with diabetic neuropathy, unspecified: Secondary | ICD-10-CM | POA: Diagnosis not present

## 2020-01-31 DIAGNOSIS — N898 Other specified noninflammatory disorders of vagina: Secondary | ICD-10-CM | POA: Diagnosis not present

## 2020-01-31 DIAGNOSIS — M48062 Spinal stenosis, lumbar region with neurogenic claudication: Secondary | ICD-10-CM | POA: Diagnosis not present

## 2020-01-31 DIAGNOSIS — E538 Deficiency of other specified B group vitamins: Secondary | ICD-10-CM | POA: Diagnosis not present

## 2020-01-31 DIAGNOSIS — N400 Enlarged prostate without lower urinary tract symptoms: Secondary | ICD-10-CM | POA: Diagnosis not present

## 2020-01-31 DIAGNOSIS — G894 Chronic pain syndrome: Secondary | ICD-10-CM | POA: Diagnosis not present

## 2020-01-31 DIAGNOSIS — C50212 Malignant neoplasm of upper-inner quadrant of left female breast: Secondary | ICD-10-CM | POA: Diagnosis not present

## 2020-01-31 DIAGNOSIS — C44311 Basal cell carcinoma of skin of nose: Secondary | ICD-10-CM | POA: Diagnosis not present

## 2020-01-31 DIAGNOSIS — S93432A Sprain of tibiofibular ligament of left ankle, initial encounter: Secondary | ICD-10-CM | POA: Diagnosis not present

## 2020-01-31 DIAGNOSIS — Z79899 Other long term (current) drug therapy: Secondary | ICD-10-CM | POA: Diagnosis not present

## 2020-01-31 DIAGNOSIS — M5116 Intervertebral disc disorders with radiculopathy, lumbar region: Secondary | ICD-10-CM | POA: Diagnosis not present

## 2020-01-31 DIAGNOSIS — M1000 Idiopathic gout, unspecified site: Secondary | ICD-10-CM | POA: Diagnosis not present

## 2020-01-31 DIAGNOSIS — Z125 Encounter for screening for malignant neoplasm of prostate: Secondary | ICD-10-CM | POA: Diagnosis not present

## 2020-01-31 DIAGNOSIS — Z87891 Personal history of nicotine dependence: Secondary | ICD-10-CM | POA: Diagnosis not present

## 2020-01-31 DIAGNOSIS — D589 Hereditary hemolytic anemia, unspecified: Secondary | ICD-10-CM | POA: Diagnosis not present

## 2020-01-31 DIAGNOSIS — Z13220 Encounter for screening for lipoid disorders: Secondary | ICD-10-CM | POA: Diagnosis not present

## 2020-01-31 DIAGNOSIS — I671 Cerebral aneurysm, nonruptured: Secondary | ICD-10-CM | POA: Diagnosis not present

## 2020-01-31 DIAGNOSIS — H538 Other visual disturbances: Secondary | ICD-10-CM | POA: Diagnosis not present

## 2020-01-31 DIAGNOSIS — N3943 Post-void dribbling: Secondary | ICD-10-CM | POA: Diagnosis not present

## 2020-01-31 DIAGNOSIS — M5442 Lumbago with sciatica, left side: Secondary | ICD-10-CM | POA: Diagnosis not present

## 2020-01-31 DIAGNOSIS — F330 Major depressive disorder, recurrent, mild: Secondary | ICD-10-CM | POA: Diagnosis not present

## 2020-01-31 DIAGNOSIS — T84021D Dislocation of internal left hip prosthesis, subsequent encounter: Secondary | ICD-10-CM | POA: Diagnosis not present

## 2020-01-31 DIAGNOSIS — Z119 Encounter for screening for infectious and parasitic diseases, unspecified: Secondary | ICD-10-CM | POA: Diagnosis not present

## 2020-01-31 DIAGNOSIS — J45909 Unspecified asthma, uncomplicated: Secondary | ICD-10-CM | POA: Diagnosis not present

## 2020-01-31 DIAGNOSIS — Z885 Allergy status to narcotic agent status: Secondary | ICD-10-CM | POA: Diagnosis not present

## 2020-01-31 DIAGNOSIS — R3915 Urgency of urination: Secondary | ICD-10-CM | POA: Diagnosis not present

## 2020-01-31 DIAGNOSIS — K648 Other hemorrhoids: Secondary | ICD-10-CM | POA: Diagnosis not present

## 2020-01-31 DIAGNOSIS — G20 Parkinson's disease: Secondary | ICD-10-CM | POA: Diagnosis not present

## 2020-01-31 DIAGNOSIS — D62 Acute posthemorrhagic anemia: Secondary | ICD-10-CM | POA: Diagnosis not present

## 2020-01-31 DIAGNOSIS — D519 Vitamin B12 deficiency anemia, unspecified: Secondary | ICD-10-CM | POA: Diagnosis not present

## 2020-01-31 DIAGNOSIS — D693 Immune thrombocytopenic purpura: Secondary | ICD-10-CM | POA: Diagnosis not present

## 2020-01-31 DIAGNOSIS — M47816 Spondylosis without myelopathy or radiculopathy, lumbar region: Secondary | ICD-10-CM | POA: Diagnosis not present

## 2020-01-31 DIAGNOSIS — G6181 Chronic inflammatory demyelinating polyneuritis: Secondary | ICD-10-CM | POA: Diagnosis not present

## 2020-01-31 DIAGNOSIS — F0390 Unspecified dementia without behavioral disturbance: Secondary | ICD-10-CM | POA: Diagnosis not present

## 2020-01-31 DIAGNOSIS — B0233 Zoster keratitis: Secondary | ICD-10-CM | POA: Diagnosis not present

## 2020-01-31 DIAGNOSIS — Z6828 Body mass index (BMI) 28.0-28.9, adult: Secondary | ICD-10-CM | POA: Diagnosis not present

## 2020-01-31 DIAGNOSIS — T7800XA Anaphylactic reaction due to unspecified food, initial encounter: Secondary | ICD-10-CM | POA: Diagnosis not present

## 2020-01-31 DIAGNOSIS — D0421 Carcinoma in situ of skin of right ear and external auricular canal: Secondary | ICD-10-CM | POA: Diagnosis not present

## 2020-01-31 DIAGNOSIS — F0281 Dementia in other diseases classified elsewhere with behavioral disturbance: Secondary | ICD-10-CM | POA: Diagnosis not present

## 2020-01-31 DIAGNOSIS — M19011 Primary osteoarthritis, right shoulder: Secondary | ICD-10-CM | POA: Diagnosis not present

## 2020-01-31 DIAGNOSIS — Z8542 Personal history of malignant neoplasm of other parts of uterus: Secondary | ICD-10-CM | POA: Diagnosis not present

## 2020-01-31 DIAGNOSIS — C7989 Secondary malignant neoplasm of other specified sites: Secondary | ICD-10-CM | POA: Diagnosis not present

## 2020-01-31 DIAGNOSIS — G9009 Other idiopathic peripheral autonomic neuropathy: Secondary | ICD-10-CM | POA: Diagnosis not present

## 2020-01-31 DIAGNOSIS — L814 Other melanin hyperpigmentation: Secondary | ICD-10-CM | POA: Diagnosis not present

## 2020-01-31 DIAGNOSIS — G4730 Sleep apnea, unspecified: Secondary | ICD-10-CM | POA: Diagnosis not present

## 2020-01-31 DIAGNOSIS — R072 Precordial pain: Secondary | ICD-10-CM | POA: Diagnosis not present

## 2020-01-31 DIAGNOSIS — Z20822 Contact with and (suspected) exposure to covid-19: Secondary | ICD-10-CM | POA: Diagnosis not present

## 2020-01-31 DIAGNOSIS — Z880 Allergy status to penicillin: Secondary | ICD-10-CM | POA: Diagnosis not present

## 2020-01-31 DIAGNOSIS — E669 Obesity, unspecified: Secondary | ICD-10-CM | POA: Diagnosis not present

## 2020-01-31 DIAGNOSIS — S0181XD Laceration without foreign body of other part of head, subsequent encounter: Secondary | ICD-10-CM | POA: Diagnosis not present

## 2020-01-31 DIAGNOSIS — E440 Moderate protein-calorie malnutrition: Secondary | ICD-10-CM | POA: Diagnosis not present

## 2020-01-31 DIAGNOSIS — M25662 Stiffness of left knee, not elsewhere classified: Secondary | ICD-10-CM | POA: Diagnosis not present

## 2020-01-31 DIAGNOSIS — Z90710 Acquired absence of both cervix and uterus: Secondary | ICD-10-CM | POA: Diagnosis not present

## 2020-01-31 DIAGNOSIS — D3612 Benign neoplasm of peripheral nerves and autonomic nervous system, upper limb, including shoulder: Secondary | ICD-10-CM | POA: Diagnosis not present

## 2020-01-31 DIAGNOSIS — S065X0D Traumatic subdural hemorrhage without loss of consciousness, subsequent encounter: Secondary | ICD-10-CM | POA: Diagnosis not present

## 2020-01-31 DIAGNOSIS — R197 Diarrhea, unspecified: Secondary | ICD-10-CM | POA: Diagnosis not present

## 2020-01-31 DIAGNOSIS — D2262 Melanocytic nevi of left upper limb, including shoulder: Secondary | ICD-10-CM | POA: Diagnosis not present

## 2020-01-31 DIAGNOSIS — I712 Thoracic aortic aneurysm, without rupture: Secondary | ICD-10-CM | POA: Diagnosis not present

## 2020-01-31 DIAGNOSIS — E785 Hyperlipidemia, unspecified: Secondary | ICD-10-CM | POA: Diagnosis not present

## 2020-01-31 DIAGNOSIS — R42 Dizziness and giddiness: Secondary | ICD-10-CM | POA: Diagnosis not present

## 2020-01-31 DIAGNOSIS — N183 Chronic kidney disease, stage 3 unspecified: Secondary | ICD-10-CM | POA: Diagnosis not present

## 2020-01-31 DIAGNOSIS — C50911 Malignant neoplasm of unspecified site of right female breast: Secondary | ICD-10-CM | POA: Diagnosis not present

## 2020-01-31 DIAGNOSIS — E1122 Type 2 diabetes mellitus with diabetic chronic kidney disease: Secondary | ICD-10-CM | POA: Diagnosis not present

## 2020-01-31 DIAGNOSIS — I719 Aortic aneurysm of unspecified site, without rupture: Secondary | ICD-10-CM | POA: Diagnosis not present

## 2020-01-31 DIAGNOSIS — E860 Dehydration: Secondary | ICD-10-CM | POA: Diagnosis not present

## 2020-01-31 DIAGNOSIS — M5126 Other intervertebral disc displacement, lumbar region: Secondary | ICD-10-CM | POA: Diagnosis not present

## 2020-01-31 DIAGNOSIS — R29898 Other symptoms and signs involving the musculoskeletal system: Secondary | ICD-10-CM | POA: Diagnosis not present

## 2020-01-31 DIAGNOSIS — L601 Onycholysis: Secondary | ICD-10-CM | POA: Diagnosis not present

## 2020-01-31 DIAGNOSIS — E875 Hyperkalemia: Secondary | ICD-10-CM | POA: Diagnosis not present

## 2020-01-31 DIAGNOSIS — F411 Generalized anxiety disorder: Secondary | ICD-10-CM | POA: Diagnosis not present

## 2020-01-31 DIAGNOSIS — M4316 Spondylolisthesis, lumbar region: Secondary | ICD-10-CM | POA: Diagnosis not present

## 2020-01-31 DIAGNOSIS — Z01812 Encounter for preprocedural laboratory examination: Secondary | ICD-10-CM | POA: Diagnosis not present

## 2020-01-31 DIAGNOSIS — R279 Unspecified lack of coordination: Secondary | ICD-10-CM | POA: Diagnosis not present

## 2020-01-31 DIAGNOSIS — I440 Atrioventricular block, first degree: Secondary | ICD-10-CM | POA: Diagnosis not present

## 2020-01-31 DIAGNOSIS — I639 Cerebral infarction, unspecified: Secondary | ICD-10-CM | POA: Diagnosis not present

## 2020-01-31 DIAGNOSIS — X32XXXD Exposure to sunlight, subsequent encounter: Secondary | ICD-10-CM | POA: Diagnosis not present

## 2020-01-31 DIAGNOSIS — L538 Other specified erythematous conditions: Secondary | ICD-10-CM | POA: Diagnosis not present

## 2020-01-31 DIAGNOSIS — M75112 Incomplete rotator cuff tear or rupture of left shoulder, not specified as traumatic: Secondary | ICD-10-CM | POA: Diagnosis not present

## 2020-01-31 DIAGNOSIS — I495 Sick sinus syndrome: Secondary | ICD-10-CM | POA: Diagnosis not present

## 2020-01-31 DIAGNOSIS — L820 Inflamed seborrheic keratosis: Secondary | ICD-10-CM | POA: Diagnosis not present

## 2020-01-31 DIAGNOSIS — R000 Tachycardia, unspecified: Secondary | ICD-10-CM | POA: Diagnosis not present

## 2020-01-31 DIAGNOSIS — I10 Essential (primary) hypertension: Secondary | ICD-10-CM | POA: Diagnosis not present

## 2020-01-31 DIAGNOSIS — E039 Hypothyroidism, unspecified: Secondary | ICD-10-CM | POA: Diagnosis not present

## 2020-01-31 DIAGNOSIS — R2681 Unsteadiness on feet: Secondary | ICD-10-CM | POA: Diagnosis not present

## 2020-01-31 DIAGNOSIS — Z20828 Contact with and (suspected) exposure to other viral communicable diseases: Secondary | ICD-10-CM | POA: Diagnosis not present

## 2020-01-31 DIAGNOSIS — H2101 Hyphema, right eye: Secondary | ICD-10-CM | POA: Diagnosis not present

## 2020-01-31 DIAGNOSIS — D1801 Hemangioma of skin and subcutaneous tissue: Secondary | ICD-10-CM | POA: Diagnosis not present

## 2020-01-31 DIAGNOSIS — I255 Ischemic cardiomyopathy: Secondary | ICD-10-CM | POA: Diagnosis not present

## 2020-01-31 DIAGNOSIS — M62838 Other muscle spasm: Secondary | ICD-10-CM | POA: Diagnosis not present

## 2020-01-31 DIAGNOSIS — S069X9A Unspecified intracranial injury with loss of consciousness of unspecified duration, initial encounter: Secondary | ICD-10-CM | POA: Diagnosis not present

## 2020-01-31 DIAGNOSIS — R7309 Other abnormal glucose: Secondary | ICD-10-CM | POA: Diagnosis not present

## 2020-01-31 DIAGNOSIS — B182 Chronic viral hepatitis C: Secondary | ICD-10-CM | POA: Diagnosis not present

## 2020-01-31 DIAGNOSIS — M549 Dorsalgia, unspecified: Secondary | ICD-10-CM | POA: Diagnosis not present

## 2020-01-31 DIAGNOSIS — K591 Functional diarrhea: Secondary | ICD-10-CM | POA: Diagnosis not present

## 2020-01-31 DIAGNOSIS — F329 Major depressive disorder, single episode, unspecified: Secondary | ICD-10-CM | POA: Diagnosis not present

## 2020-01-31 DIAGNOSIS — R1310 Dysphagia, unspecified: Secondary | ICD-10-CM | POA: Diagnosis not present

## 2020-01-31 DIAGNOSIS — N393 Stress incontinence (female) (male): Secondary | ICD-10-CM | POA: Diagnosis not present

## 2020-01-31 DIAGNOSIS — N390 Urinary tract infection, site not specified: Secondary | ICD-10-CM | POA: Diagnosis not present

## 2020-01-31 DIAGNOSIS — K640 First degree hemorrhoids: Secondary | ICD-10-CM | POA: Diagnosis not present

## 2020-01-31 DIAGNOSIS — S43491D Other sprain of right shoulder joint, subsequent encounter: Secondary | ICD-10-CM | POA: Diagnosis not present

## 2020-01-31 DIAGNOSIS — I2510 Atherosclerotic heart disease of native coronary artery without angina pectoris: Secondary | ICD-10-CM | POA: Diagnosis not present

## 2020-01-31 DIAGNOSIS — S42211A Unspecified displaced fracture of surgical neck of right humerus, initial encounter for closed fracture: Secondary | ICD-10-CM | POA: Diagnosis not present

## 2020-01-31 DIAGNOSIS — Z1283 Encounter for screening for malignant neoplasm of skin: Secondary | ICD-10-CM | POA: Diagnosis not present

## 2020-01-31 DIAGNOSIS — W01198A Fall on same level from slipping, tripping and stumbling with subsequent striking against other object, initial encounter: Secondary | ICD-10-CM | POA: Diagnosis not present

## 2020-01-31 DIAGNOSIS — Z471 Aftercare following joint replacement surgery: Secondary | ICD-10-CM | POA: Diagnosis not present

## 2020-01-31 DIAGNOSIS — U071 COVID-19: Secondary | ICD-10-CM | POA: Diagnosis not present

## 2020-01-31 DIAGNOSIS — R410 Disorientation, unspecified: Secondary | ICD-10-CM | POA: Diagnosis not present

## 2020-01-31 DIAGNOSIS — R338 Other retention of urine: Secondary | ICD-10-CM | POA: Diagnosis not present

## 2020-01-31 DIAGNOSIS — H26492 Other secondary cataract, left eye: Secondary | ICD-10-CM | POA: Diagnosis not present

## 2020-01-31 DIAGNOSIS — S0083XA Contusion of other part of head, initial encounter: Secondary | ICD-10-CM | POA: Diagnosis not present

## 2020-01-31 DIAGNOSIS — J8482 Adult pulmonary Langerhans cell histiocytosis: Secondary | ICD-10-CM | POA: Diagnosis not present

## 2020-01-31 DIAGNOSIS — C44729 Squamous cell carcinoma of skin of left lower limb, including hip: Secondary | ICD-10-CM | POA: Diagnosis not present

## 2020-01-31 DIAGNOSIS — L570 Actinic keratosis: Secondary | ICD-10-CM | POA: Diagnosis not present

## 2020-01-31 DIAGNOSIS — E7800 Pure hypercholesterolemia, unspecified: Secondary | ICD-10-CM | POA: Diagnosis not present

## 2020-01-31 DIAGNOSIS — E1159 Type 2 diabetes mellitus with other circulatory complications: Secondary | ICD-10-CM | POA: Diagnosis not present

## 2020-01-31 DIAGNOSIS — E559 Vitamin D deficiency, unspecified: Secondary | ICD-10-CM | POA: Diagnosis not present

## 2020-01-31 DIAGNOSIS — F1190 Opioid use, unspecified, uncomplicated: Secondary | ICD-10-CM | POA: Diagnosis not present

## 2020-01-31 DIAGNOSIS — Z6834 Body mass index (BMI) 34.0-34.9, adult: Secondary | ICD-10-CM | POA: Diagnosis not present

## 2020-01-31 DIAGNOSIS — Z452 Encounter for adjustment and management of vascular access device: Secondary | ICD-10-CM | POA: Diagnosis not present

## 2020-01-31 DIAGNOSIS — Z8249 Family history of ischemic heart disease and other diseases of the circulatory system: Secondary | ICD-10-CM | POA: Diagnosis not present

## 2020-01-31 DIAGNOSIS — Z79811 Long term (current) use of aromatase inhibitors: Secondary | ICD-10-CM | POA: Diagnosis not present

## 2020-01-31 DIAGNOSIS — M8589 Other specified disorders of bone density and structure, multiple sites: Secondary | ICD-10-CM | POA: Diagnosis not present

## 2020-01-31 DIAGNOSIS — R7303 Prediabetes: Secondary | ICD-10-CM | POA: Diagnosis not present

## 2020-01-31 DIAGNOSIS — F17200 Nicotine dependence, unspecified, uncomplicated: Secondary | ICD-10-CM | POA: Diagnosis not present

## 2020-01-31 DIAGNOSIS — I130 Hypertensive heart and chronic kidney disease with heart failure and stage 1 through stage 4 chronic kidney disease, or unspecified chronic kidney disease: Secondary | ICD-10-CM | POA: Diagnosis not present

## 2020-01-31 DIAGNOSIS — Z7984 Long term (current) use of oral hypoglycemic drugs: Secondary | ICD-10-CM | POA: Diagnosis not present

## 2020-01-31 DIAGNOSIS — K449 Diaphragmatic hernia without obstruction or gangrene: Secondary | ICD-10-CM | POA: Diagnosis not present

## 2020-01-31 DIAGNOSIS — R7301 Impaired fasting glucose: Secondary | ICD-10-CM | POA: Diagnosis not present

## 2020-01-31 DIAGNOSIS — E1142 Type 2 diabetes mellitus with diabetic polyneuropathy: Secondary | ICD-10-CM | POA: Diagnosis not present

## 2020-01-31 DIAGNOSIS — M5136 Other intervertebral disc degeneration, lumbar region: Secondary | ICD-10-CM | POA: Diagnosis not present

## 2020-01-31 DIAGNOSIS — E041 Nontoxic single thyroid nodule: Secondary | ICD-10-CM | POA: Diagnosis not present

## 2020-01-31 DIAGNOSIS — R110 Nausea: Secondary | ICD-10-CM | POA: Diagnosis not present

## 2020-01-31 DIAGNOSIS — I714 Abdominal aortic aneurysm, without rupture: Secondary | ICD-10-CM | POA: Diagnosis not present

## 2020-01-31 DIAGNOSIS — M109 Gout, unspecified: Secondary | ICD-10-CM | POA: Diagnosis not present

## 2020-01-31 DIAGNOSIS — Z95810 Presence of automatic (implantable) cardiac defibrillator: Secondary | ICD-10-CM | POA: Diagnosis not present

## 2020-01-31 DIAGNOSIS — I4891 Unspecified atrial fibrillation: Secondary | ICD-10-CM | POA: Diagnosis not present

## 2020-01-31 DIAGNOSIS — Z833 Family history of diabetes mellitus: Secondary | ICD-10-CM | POA: Diagnosis not present

## 2020-01-31 DIAGNOSIS — M25512 Pain in left shoulder: Secondary | ICD-10-CM | POA: Diagnosis not present

## 2020-01-31 DIAGNOSIS — K5909 Other constipation: Secondary | ICD-10-CM | POA: Diagnosis not present

## 2020-01-31 DIAGNOSIS — L603 Nail dystrophy: Secondary | ICD-10-CM | POA: Diagnosis not present

## 2020-01-31 DIAGNOSIS — M1990 Unspecified osteoarthritis, unspecified site: Secondary | ICD-10-CM | POA: Diagnosis not present

## 2020-01-31 DIAGNOSIS — I959 Hypotension, unspecified: Secondary | ICD-10-CM | POA: Diagnosis not present

## 2020-01-31 DIAGNOSIS — E119 Type 2 diabetes mellitus without complications: Secondary | ICD-10-CM | POA: Diagnosis not present

## 2020-01-31 DIAGNOSIS — E042 Nontoxic multinodular goiter: Secondary | ICD-10-CM | POA: Diagnosis not present

## 2020-01-31 DIAGNOSIS — M62551 Muscle wasting and atrophy, not elsewhere classified, right thigh: Secondary | ICD-10-CM | POA: Diagnosis not present

## 2020-01-31 DIAGNOSIS — M4726 Other spondylosis with radiculopathy, lumbar region: Secondary | ICD-10-CM | POA: Diagnosis not present

## 2020-01-31 DIAGNOSIS — K921 Melena: Secondary | ICD-10-CM | POA: Diagnosis not present

## 2020-01-31 DIAGNOSIS — M069 Rheumatoid arthritis, unspecified: Secondary | ICD-10-CM | POA: Diagnosis not present

## 2020-01-31 DIAGNOSIS — G4733 Obstructive sleep apnea (adult) (pediatric): Secondary | ICD-10-CM | POA: Diagnosis not present

## 2020-01-31 DIAGNOSIS — H90A31 Mixed conductive and sensorineural hearing loss, unilateral, right ear with restricted hearing on the contralateral side: Secondary | ICD-10-CM | POA: Diagnosis not present

## 2020-02-01 DIAGNOSIS — M859 Disorder of bone density and structure, unspecified: Secondary | ICD-10-CM | POA: Diagnosis not present

## 2020-02-01 DIAGNOSIS — G894 Chronic pain syndrome: Secondary | ICD-10-CM | POA: Diagnosis not present

## 2020-02-01 DIAGNOSIS — Z8546 Personal history of malignant neoplasm of prostate: Secondary | ICD-10-CM | POA: Diagnosis not present

## 2020-02-01 DIAGNOSIS — R1013 Epigastric pain: Secondary | ICD-10-CM | POA: Diagnosis not present

## 2020-02-01 DIAGNOSIS — S72032D Displaced midcervical fracture of left femur, subsequent encounter for closed fracture with routine healing: Secondary | ICD-10-CM | POA: Diagnosis not present

## 2020-02-01 DIAGNOSIS — S72001A Fracture of unspecified part of neck of right femur, initial encounter for closed fracture: Secondary | ICD-10-CM | POA: Diagnosis not present

## 2020-02-01 DIAGNOSIS — K760 Fatty (change of) liver, not elsewhere classified: Secondary | ICD-10-CM | POA: Diagnosis not present

## 2020-02-01 DIAGNOSIS — I1310 Hypertensive heart and chronic kidney disease without heart failure, with stage 1 through stage 4 chronic kidney disease, or unspecified chronic kidney disease: Secondary | ICD-10-CM | POA: Diagnosis not present

## 2020-02-01 DIAGNOSIS — M62552 Muscle wasting and atrophy, not elsewhere classified, left thigh: Secondary | ICD-10-CM | POA: Diagnosis not present

## 2020-02-01 DIAGNOSIS — N816 Rectocele: Secondary | ICD-10-CM | POA: Diagnosis not present

## 2020-02-01 DIAGNOSIS — F419 Anxiety disorder, unspecified: Secondary | ICD-10-CM | POA: Diagnosis not present

## 2020-02-01 DIAGNOSIS — K621 Rectal polyp: Secondary | ICD-10-CM | POA: Diagnosis not present

## 2020-02-01 DIAGNOSIS — M4326 Fusion of spine, lumbar region: Secondary | ICD-10-CM | POA: Diagnosis not present

## 2020-02-01 DIAGNOSIS — I5030 Unspecified diastolic (congestive) heart failure: Secondary | ICD-10-CM | POA: Diagnosis not present

## 2020-02-01 DIAGNOSIS — G9009 Other idiopathic peripheral autonomic neuropathy: Secondary | ICD-10-CM | POA: Diagnosis not present

## 2020-02-01 DIAGNOSIS — J9622 Acute and chronic respiratory failure with hypercapnia: Secondary | ICD-10-CM | POA: Diagnosis not present

## 2020-02-01 DIAGNOSIS — K5730 Diverticulosis of large intestine without perforation or abscess without bleeding: Secondary | ICD-10-CM | POA: Diagnosis not present

## 2020-02-01 DIAGNOSIS — Z20822 Contact with and (suspected) exposure to covid-19: Secondary | ICD-10-CM | POA: Diagnosis not present

## 2020-02-01 DIAGNOSIS — C50821 Malignant neoplasm of overlapping sites of right male breast: Secondary | ICD-10-CM | POA: Diagnosis not present

## 2020-02-01 DIAGNOSIS — N1330 Unspecified hydronephrosis: Secondary | ICD-10-CM | POA: Diagnosis not present

## 2020-02-01 DIAGNOSIS — Z8542 Personal history of malignant neoplasm of other parts of uterus: Secondary | ICD-10-CM | POA: Diagnosis not present

## 2020-02-01 DIAGNOSIS — N1832 Chronic kidney disease, stage 3b: Secondary | ICD-10-CM | POA: Diagnosis not present

## 2020-02-01 DIAGNOSIS — C7911 Secondary malignant neoplasm of bladder: Secondary | ICD-10-CM | POA: Diagnosis not present

## 2020-02-01 DIAGNOSIS — D1801 Hemangioma of skin and subcutaneous tissue: Secondary | ICD-10-CM | POA: Diagnosis not present

## 2020-02-01 DIAGNOSIS — M1611 Unilateral primary osteoarthritis, right hip: Secondary | ICD-10-CM | POA: Diagnosis not present

## 2020-02-01 DIAGNOSIS — G620 Drug-induced polyneuropathy: Secondary | ICD-10-CM | POA: Diagnosis not present

## 2020-02-01 DIAGNOSIS — T383X5A Adverse effect of insulin and oral hypoglycemic [antidiabetic] drugs, initial encounter: Secondary | ICD-10-CM | POA: Diagnosis not present

## 2020-02-01 DIAGNOSIS — Z7952 Long term (current) use of systemic steroids: Secondary | ICD-10-CM | POA: Diagnosis not present

## 2020-02-01 DIAGNOSIS — R319 Hematuria, unspecified: Secondary | ICD-10-CM | POA: Diagnosis not present

## 2020-02-01 DIAGNOSIS — G5793 Unspecified mononeuropathy of bilateral lower limbs: Secondary | ICD-10-CM | POA: Diagnosis not present

## 2020-02-01 DIAGNOSIS — E1122 Type 2 diabetes mellitus with diabetic chronic kidney disease: Secondary | ICD-10-CM | POA: Diagnosis not present

## 2020-02-01 DIAGNOSIS — M25512 Pain in left shoulder: Secondary | ICD-10-CM | POA: Diagnosis not present

## 2020-02-01 DIAGNOSIS — D509 Iron deficiency anemia, unspecified: Secondary | ICD-10-CM | POA: Diagnosis not present

## 2020-02-01 DIAGNOSIS — J439 Emphysema, unspecified: Secondary | ICD-10-CM | POA: Diagnosis not present

## 2020-02-01 DIAGNOSIS — R001 Bradycardia, unspecified: Secondary | ICD-10-CM | POA: Diagnosis not present

## 2020-02-01 DIAGNOSIS — M792 Neuralgia and neuritis, unspecified: Secondary | ICD-10-CM | POA: Diagnosis not present

## 2020-02-01 DIAGNOSIS — R008 Other abnormalities of heart beat: Secondary | ICD-10-CM | POA: Diagnosis not present

## 2020-02-01 DIAGNOSIS — E11621 Type 2 diabetes mellitus with foot ulcer: Secondary | ICD-10-CM | POA: Diagnosis not present

## 2020-02-01 DIAGNOSIS — E538 Deficiency of other specified B group vitamins: Secondary | ICD-10-CM | POA: Diagnosis not present

## 2020-02-01 DIAGNOSIS — R0789 Other chest pain: Secondary | ICD-10-CM | POA: Diagnosis not present

## 2020-02-01 DIAGNOSIS — L932 Other local lupus erythematosus: Secondary | ICD-10-CM | POA: Diagnosis not present

## 2020-02-01 DIAGNOSIS — E7800 Pure hypercholesterolemia, unspecified: Secondary | ICD-10-CM | POA: Diagnosis not present

## 2020-02-01 DIAGNOSIS — Z992 Dependence on renal dialysis: Secondary | ICD-10-CM | POA: Diagnosis not present

## 2020-02-01 DIAGNOSIS — L408 Other psoriasis: Secondary | ICD-10-CM | POA: Diagnosis not present

## 2020-02-01 DIAGNOSIS — R260 Ataxic gait: Secondary | ICD-10-CM | POA: Diagnosis not present

## 2020-02-01 DIAGNOSIS — M7061 Trochanteric bursitis, right hip: Secondary | ICD-10-CM | POA: Diagnosis not present

## 2020-02-01 DIAGNOSIS — L309 Dermatitis, unspecified: Secondary | ICD-10-CM | POA: Diagnosis not present

## 2020-02-01 DIAGNOSIS — D2239 Melanocytic nevi of other parts of face: Secondary | ICD-10-CM | POA: Diagnosis not present

## 2020-02-01 DIAGNOSIS — N400 Enlarged prostate without lower urinary tract symptoms: Secondary | ICD-10-CM | POA: Diagnosis not present

## 2020-02-01 DIAGNOSIS — G459 Transient cerebral ischemic attack, unspecified: Secondary | ICD-10-CM | POA: Diagnosis not present

## 2020-02-01 DIAGNOSIS — Z933 Colostomy status: Secondary | ICD-10-CM | POA: Diagnosis not present

## 2020-02-01 DIAGNOSIS — R590 Localized enlarged lymph nodes: Secondary | ICD-10-CM | POA: Diagnosis not present

## 2020-02-01 DIAGNOSIS — C538 Malignant neoplasm of overlapping sites of cervix uteri: Secondary | ICD-10-CM | POA: Diagnosis not present

## 2020-02-01 DIAGNOSIS — D485 Neoplasm of uncertain behavior of skin: Secondary | ICD-10-CM | POA: Diagnosis not present

## 2020-02-01 DIAGNOSIS — M1990 Unspecified osteoarthritis, unspecified site: Secondary | ICD-10-CM | POA: Diagnosis not present

## 2020-02-01 DIAGNOSIS — Z8616 Personal history of COVID-19: Secondary | ICD-10-CM | POA: Diagnosis not present

## 2020-02-01 DIAGNOSIS — M170 Bilateral primary osteoarthritis of knee: Secondary | ICD-10-CM | POA: Diagnosis not present

## 2020-02-01 DIAGNOSIS — G629 Polyneuropathy, unspecified: Secondary | ICD-10-CM | POA: Diagnosis not present

## 2020-02-01 DIAGNOSIS — M5412 Radiculopathy, cervical region: Secondary | ICD-10-CM | POA: Diagnosis not present

## 2020-02-01 DIAGNOSIS — L89311 Pressure ulcer of right buttock, stage 1: Secondary | ICD-10-CM | POA: Diagnosis not present

## 2020-02-01 DIAGNOSIS — I420 Dilated cardiomyopathy: Secondary | ICD-10-CM | POA: Diagnosis not present

## 2020-02-01 DIAGNOSIS — G9340 Encephalopathy, unspecified: Secondary | ICD-10-CM | POA: Diagnosis not present

## 2020-02-01 DIAGNOSIS — K5900 Constipation, unspecified: Secondary | ICD-10-CM | POA: Diagnosis not present

## 2020-02-01 DIAGNOSIS — M4726 Other spondylosis with radiculopathy, lumbar region: Secondary | ICD-10-CM | POA: Diagnosis not present

## 2020-02-01 DIAGNOSIS — M810 Age-related osteoporosis without current pathological fracture: Secondary | ICD-10-CM | POA: Diagnosis not present

## 2020-02-01 DIAGNOSIS — K222 Esophageal obstruction: Secondary | ICD-10-CM | POA: Diagnosis not present

## 2020-02-01 DIAGNOSIS — R1030 Lower abdominal pain, unspecified: Secondary | ICD-10-CM | POA: Diagnosis not present

## 2020-02-01 DIAGNOSIS — C44311 Basal cell carcinoma of skin of nose: Secondary | ICD-10-CM | POA: Diagnosis not present

## 2020-02-01 DIAGNOSIS — R599 Enlarged lymph nodes, unspecified: Secondary | ICD-10-CM | POA: Diagnosis not present

## 2020-02-01 DIAGNOSIS — C44719 Basal cell carcinoma of skin of left lower limb, including hip: Secondary | ICD-10-CM | POA: Diagnosis not present

## 2020-02-01 DIAGNOSIS — Z0000 Encounter for general adult medical examination without abnormal findings: Secondary | ICD-10-CM | POA: Diagnosis not present

## 2020-02-01 DIAGNOSIS — Z510 Encounter for antineoplastic radiation therapy: Secondary | ICD-10-CM | POA: Diagnosis not present

## 2020-02-01 DIAGNOSIS — N186 End stage renal disease: Secondary | ICD-10-CM | POA: Diagnosis not present

## 2020-02-01 DIAGNOSIS — L249 Irritant contact dermatitis, unspecified cause: Secondary | ICD-10-CM | POA: Diagnosis not present

## 2020-02-01 DIAGNOSIS — M25462 Effusion, left knee: Secondary | ICD-10-CM | POA: Diagnosis not present

## 2020-02-01 DIAGNOSIS — I10 Essential (primary) hypertension: Secondary | ICD-10-CM | POA: Diagnosis not present

## 2020-02-01 DIAGNOSIS — C50411 Malignant neoplasm of upper-outer quadrant of right female breast: Secondary | ICD-10-CM | POA: Diagnosis not present

## 2020-02-01 DIAGNOSIS — N183 Chronic kidney disease, stage 3 unspecified: Secondary | ICD-10-CM | POA: Diagnosis not present

## 2020-02-01 DIAGNOSIS — M179 Osteoarthritis of knee, unspecified: Secondary | ICD-10-CM | POA: Diagnosis not present

## 2020-02-01 DIAGNOSIS — C7A012 Malignant carcinoid tumor of the ileum: Secondary | ICD-10-CM | POA: Diagnosis not present

## 2020-02-01 DIAGNOSIS — Z87891 Personal history of nicotine dependence: Secondary | ICD-10-CM | POA: Diagnosis not present

## 2020-02-01 DIAGNOSIS — R55 Syncope and collapse: Secondary | ICD-10-CM | POA: Diagnosis not present

## 2020-02-01 DIAGNOSIS — S72041D Displaced fracture of base of neck of right femur, subsequent encounter for closed fracture with routine healing: Secondary | ICD-10-CM | POA: Diagnosis not present

## 2020-02-01 DIAGNOSIS — M25652 Stiffness of left hip, not elsewhere classified: Secondary | ICD-10-CM | POA: Diagnosis not present

## 2020-02-01 DIAGNOSIS — R0989 Other specified symptoms and signs involving the circulatory and respiratory systems: Secondary | ICD-10-CM | POA: Diagnosis not present

## 2020-02-01 DIAGNOSIS — M4802 Spinal stenosis, cervical region: Secondary | ICD-10-CM | POA: Diagnosis not present

## 2020-02-01 DIAGNOSIS — M79675 Pain in left toe(s): Secondary | ICD-10-CM | POA: Diagnosis not present

## 2020-02-01 DIAGNOSIS — N1831 Chronic kidney disease, stage 3a: Secondary | ICD-10-CM | POA: Diagnosis not present

## 2020-02-01 DIAGNOSIS — Z8701 Personal history of pneumonia (recurrent): Secondary | ICD-10-CM | POA: Diagnosis not present

## 2020-02-01 DIAGNOSIS — M4804 Spinal stenosis, thoracic region: Secondary | ICD-10-CM | POA: Diagnosis not present

## 2020-02-01 DIAGNOSIS — Z471 Aftercare following joint replacement surgery: Secondary | ICD-10-CM | POA: Diagnosis not present

## 2020-02-01 DIAGNOSIS — Z9889 Other specified postprocedural states: Secondary | ICD-10-CM | POA: Diagnosis not present

## 2020-02-01 DIAGNOSIS — D2372 Other benign neoplasm of skin of left lower limb, including hip: Secondary | ICD-10-CM | POA: Diagnosis not present

## 2020-02-01 DIAGNOSIS — Z950 Presence of cardiac pacemaker: Secondary | ICD-10-CM | POA: Diagnosis not present

## 2020-02-01 DIAGNOSIS — E11649 Type 2 diabetes mellitus with hypoglycemia without coma: Secondary | ICD-10-CM | POA: Diagnosis not present

## 2020-02-01 DIAGNOSIS — D2271 Melanocytic nevi of right lower limb, including hip: Secondary | ICD-10-CM | POA: Diagnosis not present

## 2020-02-01 DIAGNOSIS — N393 Stress incontinence (female) (male): Secondary | ICD-10-CM | POA: Diagnosis not present

## 2020-02-01 DIAGNOSIS — I480 Paroxysmal atrial fibrillation: Secondary | ICD-10-CM | POA: Diagnosis not present

## 2020-02-01 DIAGNOSIS — Z9641 Presence of insulin pump (external) (internal): Secondary | ICD-10-CM | POA: Diagnosis not present

## 2020-02-01 DIAGNOSIS — C160 Malignant neoplasm of cardia: Secondary | ICD-10-CM | POA: Diagnosis not present

## 2020-02-01 DIAGNOSIS — M549 Dorsalgia, unspecified: Secondary | ICD-10-CM | POA: Diagnosis not present

## 2020-02-01 DIAGNOSIS — I5023 Acute on chronic systolic (congestive) heart failure: Secondary | ICD-10-CM | POA: Diagnosis not present

## 2020-02-01 DIAGNOSIS — Z8041 Family history of malignant neoplasm of ovary: Secondary | ICD-10-CM | POA: Diagnosis not present

## 2020-02-01 DIAGNOSIS — Z7901 Long term (current) use of anticoagulants: Secondary | ICD-10-CM | POA: Diagnosis not present

## 2020-02-01 DIAGNOSIS — R300 Dysuria: Secondary | ICD-10-CM | POA: Diagnosis not present

## 2020-02-01 DIAGNOSIS — J449 Chronic obstructive pulmonary disease, unspecified: Secondary | ICD-10-CM | POA: Diagnosis not present

## 2020-02-01 DIAGNOSIS — M79661 Pain in right lower leg: Secondary | ICD-10-CM | POA: Diagnosis not present

## 2020-02-01 DIAGNOSIS — D461 Refractory anemia with ring sideroblasts: Secondary | ICD-10-CM | POA: Diagnosis not present

## 2020-02-01 DIAGNOSIS — E119 Type 2 diabetes mellitus without complications: Secondary | ICD-10-CM | POA: Diagnosis not present

## 2020-02-01 DIAGNOSIS — M2041 Other hammer toe(s) (acquired), right foot: Secondary | ICD-10-CM | POA: Diagnosis not present

## 2020-02-01 DIAGNOSIS — K4090 Unilateral inguinal hernia, without obstruction or gangrene, not specified as recurrent: Secondary | ICD-10-CM | POA: Diagnosis not present

## 2020-02-01 DIAGNOSIS — M159 Polyosteoarthritis, unspecified: Secondary | ICD-10-CM | POA: Diagnosis not present

## 2020-02-01 DIAGNOSIS — C50212 Malignant neoplasm of upper-inner quadrant of left female breast: Secondary | ICD-10-CM | POA: Diagnosis not present

## 2020-02-01 DIAGNOSIS — I4821 Permanent atrial fibrillation: Secondary | ICD-10-CM | POA: Diagnosis not present

## 2020-02-01 DIAGNOSIS — C7B02 Secondary carcinoid tumors of liver: Secondary | ICD-10-CM | POA: Diagnosis not present

## 2020-02-01 DIAGNOSIS — Z96652 Presence of left artificial knee joint: Secondary | ICD-10-CM | POA: Diagnosis not present

## 2020-02-01 DIAGNOSIS — M180 Bilateral primary osteoarthritis of first carpometacarpal joints: Secondary | ICD-10-CM | POA: Diagnosis not present

## 2020-02-01 DIAGNOSIS — I97820 Postprocedural cerebrovascular infarction during cardiac surgery: Secondary | ICD-10-CM | POA: Diagnosis not present

## 2020-02-01 DIAGNOSIS — I160 Hypertensive urgency: Secondary | ICD-10-CM | POA: Diagnosis not present

## 2020-02-01 DIAGNOSIS — K5520 Angiodysplasia of colon without hemorrhage: Secondary | ICD-10-CM | POA: Diagnosis not present

## 2020-02-01 DIAGNOSIS — M25572 Pain in left ankle and joints of left foot: Secondary | ICD-10-CM | POA: Diagnosis not present

## 2020-02-01 DIAGNOSIS — Z85828 Personal history of other malignant neoplasm of skin: Secondary | ICD-10-CM | POA: Diagnosis not present

## 2020-02-01 DIAGNOSIS — N10 Acute tubulo-interstitial nephritis: Secondary | ICD-10-CM | POA: Diagnosis not present

## 2020-02-01 DIAGNOSIS — H409 Unspecified glaucoma: Secondary | ICD-10-CM | POA: Diagnosis not present

## 2020-02-01 DIAGNOSIS — H6122 Impacted cerumen, left ear: Secondary | ICD-10-CM | POA: Diagnosis not present

## 2020-02-01 DIAGNOSIS — D649 Anemia, unspecified: Secondary | ICD-10-CM | POA: Diagnosis not present

## 2020-02-01 DIAGNOSIS — E1169 Type 2 diabetes mellitus with other specified complication: Secondary | ICD-10-CM | POA: Diagnosis not present

## 2020-02-01 DIAGNOSIS — M7542 Impingement syndrome of left shoulder: Secondary | ICD-10-CM | POA: Diagnosis not present

## 2020-02-01 DIAGNOSIS — Z794 Long term (current) use of insulin: Secondary | ICD-10-CM | POA: Diagnosis not present

## 2020-02-01 DIAGNOSIS — M19039 Primary osteoarthritis, unspecified wrist: Secondary | ICD-10-CM | POA: Diagnosis not present

## 2020-02-01 DIAGNOSIS — M25531 Pain in right wrist: Secondary | ICD-10-CM | POA: Diagnosis not present

## 2020-02-01 DIAGNOSIS — J441 Chronic obstructive pulmonary disease with (acute) exacerbation: Secondary | ICD-10-CM | POA: Diagnosis not present

## 2020-02-01 DIAGNOSIS — G8191 Hemiplegia, unspecified affecting right dominant side: Secondary | ICD-10-CM | POA: Diagnosis not present

## 2020-02-01 DIAGNOSIS — Z01812 Encounter for preprocedural laboratory examination: Secondary | ICD-10-CM | POA: Diagnosis not present

## 2020-02-01 DIAGNOSIS — J9621 Acute and chronic respiratory failure with hypoxia: Secondary | ICD-10-CM | POA: Diagnosis not present

## 2020-02-01 DIAGNOSIS — Z171 Estrogen receptor negative status [ER-]: Secondary | ICD-10-CM | POA: Diagnosis not present

## 2020-02-01 DIAGNOSIS — N3010 Interstitial cystitis (chronic) without hematuria: Secondary | ICD-10-CM | POA: Diagnosis not present

## 2020-02-01 DIAGNOSIS — Z880 Allergy status to penicillin: Secondary | ICD-10-CM | POA: Diagnosis not present

## 2020-02-01 DIAGNOSIS — M0579 Rheumatoid arthritis with rheumatoid factor of multiple sites without organ or systems involvement: Secondary | ICD-10-CM | POA: Diagnosis not present

## 2020-02-01 DIAGNOSIS — Z96642 Presence of left artificial hip joint: Secondary | ICD-10-CM | POA: Diagnosis not present

## 2020-02-01 DIAGNOSIS — S2241XD Multiple fractures of ribs, right side, subsequent encounter for fracture with routine healing: Secondary | ICD-10-CM | POA: Diagnosis not present

## 2020-02-01 DIAGNOSIS — R29898 Other symptoms and signs involving the musculoskeletal system: Secondary | ICD-10-CM | POA: Diagnosis not present

## 2020-02-01 DIAGNOSIS — M62551 Muscle wasting and atrophy, not elsewhere classified, right thigh: Secondary | ICD-10-CM | POA: Diagnosis not present

## 2020-02-01 DIAGNOSIS — M25559 Pain in unspecified hip: Secondary | ICD-10-CM | POA: Diagnosis not present

## 2020-02-01 DIAGNOSIS — F331 Major depressive disorder, recurrent, moderate: Secondary | ICD-10-CM | POA: Diagnosis not present

## 2020-02-01 DIAGNOSIS — N401 Enlarged prostate with lower urinary tract symptoms: Secondary | ICD-10-CM | POA: Diagnosis not present

## 2020-02-01 DIAGNOSIS — M9903 Segmental and somatic dysfunction of lumbar region: Secondary | ICD-10-CM | POA: Diagnosis not present

## 2020-02-01 DIAGNOSIS — I350 Nonrheumatic aortic (valve) stenosis: Secondary | ICD-10-CM | POA: Diagnosis not present

## 2020-02-01 DIAGNOSIS — C561 Malignant neoplasm of right ovary: Secondary | ICD-10-CM | POA: Diagnosis not present

## 2020-02-01 DIAGNOSIS — Z954 Presence of other heart-valve replacement: Secondary | ICD-10-CM | POA: Diagnosis not present

## 2020-02-01 DIAGNOSIS — M5116 Intervertebral disc disorders with radiculopathy, lumbar region: Secondary | ICD-10-CM | POA: Diagnosis not present

## 2020-02-01 DIAGNOSIS — M1712 Unilateral primary osteoarthritis, left knee: Secondary | ICD-10-CM | POA: Diagnosis not present

## 2020-02-01 DIAGNOSIS — S46812A Strain of other muscles, fascia and tendons at shoulder and upper arm level, left arm, initial encounter: Secondary | ICD-10-CM | POA: Diagnosis not present

## 2020-02-01 DIAGNOSIS — M25552 Pain in left hip: Secondary | ICD-10-CM | POA: Diagnosis not present

## 2020-02-01 DIAGNOSIS — M48061 Spinal stenosis, lumbar region without neurogenic claudication: Secondary | ICD-10-CM | POA: Diagnosis not present

## 2020-02-01 DIAGNOSIS — L814 Other melanin hyperpigmentation: Secondary | ICD-10-CM | POA: Diagnosis not present

## 2020-02-01 DIAGNOSIS — Z0001 Encounter for general adult medical examination with abnormal findings: Secondary | ICD-10-CM | POA: Diagnosis not present

## 2020-02-01 DIAGNOSIS — C7951 Secondary malignant neoplasm of bone: Secondary | ICD-10-CM | POA: Diagnosis not present

## 2020-02-01 DIAGNOSIS — E1151 Type 2 diabetes mellitus with diabetic peripheral angiopathy without gangrene: Secondary | ICD-10-CM | POA: Diagnosis not present

## 2020-02-01 DIAGNOSIS — R4781 Slurred speech: Secondary | ICD-10-CM | POA: Diagnosis not present

## 2020-02-01 DIAGNOSIS — Z820 Family history of epilepsy and other diseases of the nervous system: Secondary | ICD-10-CM | POA: Diagnosis not present

## 2020-02-01 DIAGNOSIS — I255 Ischemic cardiomyopathy: Secondary | ICD-10-CM | POA: Diagnosis not present

## 2020-02-01 DIAGNOSIS — B351 Tinea unguium: Secondary | ICD-10-CM | POA: Diagnosis not present

## 2020-02-01 DIAGNOSIS — F325 Major depressive disorder, single episode, in full remission: Secondary | ICD-10-CM | POA: Diagnosis not present

## 2020-02-01 DIAGNOSIS — D0472 Carcinoma in situ of skin of left lower limb, including hip: Secondary | ICD-10-CM | POA: Diagnosis not present

## 2020-02-01 DIAGNOSIS — I110 Hypertensive heart disease with heart failure: Secondary | ICD-10-CM | POA: Diagnosis not present

## 2020-02-01 DIAGNOSIS — M545 Low back pain: Secondary | ICD-10-CM | POA: Diagnosis not present

## 2020-02-01 DIAGNOSIS — L578 Other skin changes due to chronic exposure to nonionizing radiation: Secondary | ICD-10-CM | POA: Diagnosis not present

## 2020-02-01 DIAGNOSIS — M79605 Pain in left leg: Secondary | ICD-10-CM | POA: Diagnosis not present

## 2020-02-01 DIAGNOSIS — E782 Mixed hyperlipidemia: Secondary | ICD-10-CM | POA: Diagnosis not present

## 2020-02-01 DIAGNOSIS — Z961 Presence of intraocular lens: Secondary | ICD-10-CM | POA: Diagnosis not present

## 2020-02-01 DIAGNOSIS — Z136 Encounter for screening for cardiovascular disorders: Secondary | ICD-10-CM | POA: Diagnosis not present

## 2020-02-01 DIAGNOSIS — L853 Xerosis cutis: Secondary | ICD-10-CM | POA: Diagnosis not present

## 2020-02-01 DIAGNOSIS — N907 Vulvar cyst: Secondary | ICD-10-CM | POA: Diagnosis not present

## 2020-02-01 DIAGNOSIS — M1711 Unilateral primary osteoarthritis, right knee: Secondary | ICD-10-CM | POA: Diagnosis not present

## 2020-02-01 DIAGNOSIS — Z79890 Hormone replacement therapy (postmenopausal): Secondary | ICD-10-CM | POA: Diagnosis not present

## 2020-02-01 DIAGNOSIS — D689 Coagulation defect, unspecified: Secondary | ICD-10-CM | POA: Diagnosis not present

## 2020-02-01 DIAGNOSIS — L308 Other specified dermatitis: Secondary | ICD-10-CM | POA: Diagnosis not present

## 2020-02-01 DIAGNOSIS — R197 Diarrhea, unspecified: Secondary | ICD-10-CM | POA: Diagnosis not present

## 2020-02-01 DIAGNOSIS — C801 Malignant (primary) neoplasm, unspecified: Secondary | ICD-10-CM | POA: Diagnosis not present

## 2020-02-01 DIAGNOSIS — Z7689 Persons encountering health services in other specified circumstances: Secondary | ICD-10-CM | POA: Diagnosis not present

## 2020-02-01 DIAGNOSIS — R404 Transient alteration of awareness: Secondary | ICD-10-CM | POA: Diagnosis not present

## 2020-02-01 DIAGNOSIS — Z6841 Body Mass Index (BMI) 40.0 and over, adult: Secondary | ICD-10-CM | POA: Diagnosis not present

## 2020-02-01 DIAGNOSIS — N281 Cyst of kidney, acquired: Secondary | ICD-10-CM | POA: Diagnosis not present

## 2020-02-01 DIAGNOSIS — M65341 Trigger finger, right ring finger: Secondary | ICD-10-CM | POA: Diagnosis not present

## 2020-02-01 DIAGNOSIS — F411 Generalized anxiety disorder: Secondary | ICD-10-CM | POA: Diagnosis not present

## 2020-02-01 DIAGNOSIS — Z89512 Acquired absence of left leg below knee: Secondary | ICD-10-CM | POA: Diagnosis not present

## 2020-02-01 DIAGNOSIS — M1710 Unilateral primary osteoarthritis, unspecified knee: Secondary | ICD-10-CM | POA: Diagnosis not present

## 2020-02-01 DIAGNOSIS — M25532 Pain in left wrist: Secondary | ICD-10-CM | POA: Diagnosis not present

## 2020-02-01 DIAGNOSIS — M25611 Stiffness of right shoulder, not elsewhere classified: Secondary | ICD-10-CM | POA: Diagnosis not present

## 2020-02-01 DIAGNOSIS — R262 Difficulty in walking, not elsewhere classified: Secondary | ICD-10-CM | POA: Diagnosis not present

## 2020-02-01 DIAGNOSIS — M5136 Other intervertebral disc degeneration, lumbar region: Secondary | ICD-10-CM | POA: Diagnosis not present

## 2020-02-01 DIAGNOSIS — S42202D Unspecified fracture of upper end of left humerus, subsequent encounter for fracture with routine healing: Secondary | ICD-10-CM | POA: Diagnosis not present

## 2020-02-01 DIAGNOSIS — C44319 Basal cell carcinoma of skin of other parts of face: Secondary | ICD-10-CM | POA: Diagnosis not present

## 2020-02-01 DIAGNOSIS — C773 Secondary and unspecified malignant neoplasm of axilla and upper limb lymph nodes: Secondary | ICD-10-CM | POA: Diagnosis not present

## 2020-02-01 DIAGNOSIS — E669 Obesity, unspecified: Secondary | ICD-10-CM | POA: Diagnosis not present

## 2020-02-01 DIAGNOSIS — J45909 Unspecified asthma, uncomplicated: Secondary | ICD-10-CM | POA: Diagnosis not present

## 2020-02-01 DIAGNOSIS — S46012D Strain of muscle(s) and tendon(s) of the rotator cuff of left shoulder, subsequent encounter: Secondary | ICD-10-CM | POA: Diagnosis not present

## 2020-02-01 DIAGNOSIS — L89322 Pressure ulcer of left buttock, stage 2: Secondary | ICD-10-CM | POA: Diagnosis not present

## 2020-02-01 DIAGNOSIS — J4540 Moderate persistent asthma, uncomplicated: Secondary | ICD-10-CM | POA: Diagnosis not present

## 2020-02-01 DIAGNOSIS — M4712 Other spondylosis with myelopathy, cervical region: Secondary | ICD-10-CM | POA: Diagnosis not present

## 2020-02-01 DIAGNOSIS — M79674 Pain in right toe(s): Secondary | ICD-10-CM | POA: Diagnosis not present

## 2020-02-01 DIAGNOSIS — I719 Aortic aneurysm of unspecified site, without rupture: Secondary | ICD-10-CM | POA: Diagnosis not present

## 2020-02-01 DIAGNOSIS — E11610 Type 2 diabetes mellitus with diabetic neuropathic arthropathy: Secondary | ICD-10-CM | POA: Diagnosis not present

## 2020-02-01 DIAGNOSIS — D508 Other iron deficiency anemias: Secondary | ICD-10-CM | POA: Diagnosis not present

## 2020-02-01 DIAGNOSIS — M4317 Spondylolisthesis, lumbosacral region: Secondary | ICD-10-CM | POA: Diagnosis not present

## 2020-02-01 DIAGNOSIS — R2990 Unspecified symptoms and signs involving the nervous system: Secondary | ICD-10-CM | POA: Diagnosis not present

## 2020-02-01 DIAGNOSIS — K648 Other hemorrhoids: Secondary | ICD-10-CM | POA: Diagnosis not present

## 2020-02-01 DIAGNOSIS — Z1211 Encounter for screening for malignant neoplasm of colon: Secondary | ICD-10-CM | POA: Diagnosis not present

## 2020-02-01 DIAGNOSIS — I739 Peripheral vascular disease, unspecified: Secondary | ICD-10-CM | POA: Diagnosis not present

## 2020-02-01 DIAGNOSIS — E46 Unspecified protein-calorie malnutrition: Secondary | ICD-10-CM | POA: Diagnosis not present

## 2020-02-01 DIAGNOSIS — I509 Heart failure, unspecified: Secondary | ICD-10-CM | POA: Diagnosis not present

## 2020-02-01 DIAGNOSIS — D0462 Carcinoma in situ of skin of left upper limb, including shoulder: Secondary | ICD-10-CM | POA: Diagnosis not present

## 2020-02-01 DIAGNOSIS — F4323 Adjustment disorder with mixed anxiety and depressed mood: Secondary | ICD-10-CM | POA: Diagnosis not present

## 2020-02-01 DIAGNOSIS — Z5111 Encounter for antineoplastic chemotherapy: Secondary | ICD-10-CM | POA: Diagnosis not present

## 2020-02-01 DIAGNOSIS — M6289 Other specified disorders of muscle: Secondary | ICD-10-CM | POA: Diagnosis not present

## 2020-02-01 DIAGNOSIS — I272 Pulmonary hypertension, unspecified: Secondary | ICD-10-CM | POA: Diagnosis not present

## 2020-02-01 DIAGNOSIS — F17210 Nicotine dependence, cigarettes, uncomplicated: Secondary | ICD-10-CM | POA: Diagnosis not present

## 2020-02-01 DIAGNOSIS — K2930 Chronic superficial gastritis without bleeding: Secondary | ICD-10-CM | POA: Diagnosis not present

## 2020-02-01 DIAGNOSIS — M79604 Pain in right leg: Secondary | ICD-10-CM | POA: Diagnosis not present

## 2020-02-01 DIAGNOSIS — H524 Presbyopia: Secondary | ICD-10-CM | POA: Diagnosis not present

## 2020-02-01 DIAGNOSIS — H903 Sensorineural hearing loss, bilateral: Secondary | ICD-10-CM | POA: Diagnosis not present

## 2020-02-01 DIAGNOSIS — R413 Other amnesia: Secondary | ICD-10-CM | POA: Diagnosis not present

## 2020-02-01 DIAGNOSIS — M205X1 Other deformities of toe(s) (acquired), right foot: Secondary | ICD-10-CM | POA: Diagnosis not present

## 2020-02-01 DIAGNOSIS — R946 Abnormal results of thyroid function studies: Secondary | ICD-10-CM | POA: Diagnosis not present

## 2020-02-01 DIAGNOSIS — M25561 Pain in right knee: Secondary | ICD-10-CM | POA: Diagnosis not present

## 2020-02-01 DIAGNOSIS — K625 Hemorrhage of anus and rectum: Secondary | ICD-10-CM | POA: Diagnosis not present

## 2020-02-01 DIAGNOSIS — Z1329 Encounter for screening for other suspected endocrine disorder: Secondary | ICD-10-CM | POA: Diagnosis not present

## 2020-02-01 DIAGNOSIS — Z8673 Personal history of transient ischemic attack (TIA), and cerebral infarction without residual deficits: Secondary | ICD-10-CM | POA: Diagnosis not present

## 2020-02-01 DIAGNOSIS — Z86718 Personal history of other venous thrombosis and embolism: Secondary | ICD-10-CM | POA: Diagnosis not present

## 2020-02-01 DIAGNOSIS — N138 Other obstructive and reflux uropathy: Secondary | ICD-10-CM | POA: Diagnosis not present

## 2020-02-01 DIAGNOSIS — I639 Cerebral infarction, unspecified: Secondary | ICD-10-CM | POA: Diagnosis not present

## 2020-02-01 DIAGNOSIS — M9904 Segmental and somatic dysfunction of sacral region: Secondary | ICD-10-CM | POA: Diagnosis not present

## 2020-02-01 DIAGNOSIS — D472 Monoclonal gammopathy: Secondary | ICD-10-CM | POA: Diagnosis not present

## 2020-02-01 DIAGNOSIS — M5030 Other cervical disc degeneration, unspecified cervical region: Secondary | ICD-10-CM | POA: Diagnosis not present

## 2020-02-01 DIAGNOSIS — K635 Polyp of colon: Secondary | ICD-10-CM | POA: Diagnosis not present

## 2020-02-01 DIAGNOSIS — L89613 Pressure ulcer of right heel, stage 3: Secondary | ICD-10-CM | POA: Diagnosis not present

## 2020-02-01 DIAGNOSIS — Z125 Encounter for screening for malignant neoplasm of prostate: Secondary | ICD-10-CM | POA: Diagnosis not present

## 2020-02-01 DIAGNOSIS — E860 Dehydration: Secondary | ICD-10-CM | POA: Diagnosis not present

## 2020-02-01 DIAGNOSIS — M65331 Trigger finger, right middle finger: Secondary | ICD-10-CM | POA: Diagnosis not present

## 2020-02-01 DIAGNOSIS — R195 Other fecal abnormalities: Secondary | ICD-10-CM | POA: Diagnosis not present

## 2020-02-01 DIAGNOSIS — Z170 Estrogen receptor positive status [ER+]: Secondary | ICD-10-CM | POA: Diagnosis not present

## 2020-02-01 DIAGNOSIS — M25631 Stiffness of right wrist, not elsewhere classified: Secondary | ICD-10-CM | POA: Diagnosis not present

## 2020-02-01 DIAGNOSIS — D500 Iron deficiency anemia secondary to blood loss (chronic): Secondary | ICD-10-CM | POA: Diagnosis not present

## 2020-02-01 DIAGNOSIS — M069 Rheumatoid arthritis, unspecified: Secondary | ICD-10-CM | POA: Diagnosis not present

## 2020-02-01 DIAGNOSIS — Z87820 Personal history of traumatic brain injury: Secondary | ICD-10-CM | POA: Diagnosis not present

## 2020-02-01 DIAGNOSIS — E1159 Type 2 diabetes mellitus with other circulatory complications: Secondary | ICD-10-CM | POA: Diagnosis not present

## 2020-02-01 DIAGNOSIS — I5043 Acute on chronic combined systolic (congestive) and diastolic (congestive) heart failure: Secondary | ICD-10-CM | POA: Diagnosis not present

## 2020-02-01 DIAGNOSIS — M2011 Hallux valgus (acquired), right foot: Secondary | ICD-10-CM | POA: Diagnosis not present

## 2020-02-01 DIAGNOSIS — D225 Melanocytic nevi of trunk: Secondary | ICD-10-CM | POA: Diagnosis not present

## 2020-02-01 DIAGNOSIS — I421 Obstructive hypertrophic cardiomyopathy: Secondary | ICD-10-CM | POA: Diagnosis not present

## 2020-02-01 DIAGNOSIS — G8929 Other chronic pain: Secondary | ICD-10-CM | POA: Diagnosis not present

## 2020-02-01 DIAGNOSIS — K589 Irritable bowel syndrome without diarrhea: Secondary | ICD-10-CM | POA: Diagnosis not present

## 2020-02-01 DIAGNOSIS — M62838 Other muscle spasm: Secondary | ICD-10-CM | POA: Diagnosis not present

## 2020-02-01 DIAGNOSIS — I129 Hypertensive chronic kidney disease with stage 1 through stage 4 chronic kidney disease, or unspecified chronic kidney disease: Secondary | ICD-10-CM | POA: Diagnosis not present

## 2020-02-01 DIAGNOSIS — I493 Ventricular premature depolarization: Secondary | ICD-10-CM | POA: Diagnosis not present

## 2020-02-01 DIAGNOSIS — I4819 Other persistent atrial fibrillation: Secondary | ICD-10-CM | POA: Diagnosis not present

## 2020-02-01 DIAGNOSIS — E1121 Type 2 diabetes mellitus with diabetic nephropathy: Secondary | ICD-10-CM | POA: Diagnosis not present

## 2020-02-01 DIAGNOSIS — Z466 Encounter for fitting and adjustment of urinary device: Secondary | ICD-10-CM | POA: Diagnosis not present

## 2020-02-01 DIAGNOSIS — J4521 Mild intermittent asthma with (acute) exacerbation: Secondary | ICD-10-CM | POA: Diagnosis not present

## 2020-02-01 DIAGNOSIS — Z8551 Personal history of malignant neoplasm of bladder: Secondary | ICD-10-CM | POA: Diagnosis not present

## 2020-02-01 DIAGNOSIS — L89623 Pressure ulcer of left heel, stage 3: Secondary | ICD-10-CM | POA: Diagnosis not present

## 2020-02-01 DIAGNOSIS — E039 Hypothyroidism, unspecified: Secondary | ICD-10-CM | POA: Diagnosis not present

## 2020-02-01 DIAGNOSIS — S81802A Unspecified open wound, left lower leg, initial encounter: Secondary | ICD-10-CM | POA: Diagnosis not present

## 2020-02-01 DIAGNOSIS — M150 Primary generalized (osteo)arthritis: Secondary | ICD-10-CM | POA: Diagnosis not present

## 2020-02-01 DIAGNOSIS — I872 Venous insufficiency (chronic) (peripheral): Secondary | ICD-10-CM | POA: Diagnosis not present

## 2020-02-01 DIAGNOSIS — E559 Vitamin D deficiency, unspecified: Secondary | ICD-10-CM | POA: Diagnosis not present

## 2020-02-01 DIAGNOSIS — Z79899 Other long term (current) drug therapy: Secondary | ICD-10-CM | POA: Diagnosis not present

## 2020-02-01 DIAGNOSIS — Z87442 Personal history of urinary calculi: Secondary | ICD-10-CM | POA: Diagnosis not present

## 2020-02-01 DIAGNOSIS — R296 Repeated falls: Secondary | ICD-10-CM | POA: Diagnosis not present

## 2020-02-01 DIAGNOSIS — F319 Bipolar disorder, unspecified: Secondary | ICD-10-CM | POA: Diagnosis not present

## 2020-02-01 DIAGNOSIS — I5033 Acute on chronic diastolic (congestive) heart failure: Secondary | ICD-10-CM | POA: Diagnosis not present

## 2020-02-01 DIAGNOSIS — M25461 Effusion, right knee: Secondary | ICD-10-CM | POA: Diagnosis not present

## 2020-02-01 DIAGNOSIS — I209 Angina pectoris, unspecified: Secondary | ICD-10-CM | POA: Diagnosis not present

## 2020-02-01 DIAGNOSIS — M25612 Stiffness of left shoulder, not elsewhere classified: Secondary | ICD-10-CM | POA: Diagnosis not present

## 2020-02-01 DIAGNOSIS — N390 Urinary tract infection, site not specified: Secondary | ICD-10-CM | POA: Diagnosis not present

## 2020-02-01 DIAGNOSIS — Z936 Other artificial openings of urinary tract status: Secondary | ICD-10-CM | POA: Diagnosis not present

## 2020-02-01 DIAGNOSIS — S069X9A Unspecified intracranial injury with loss of consciousness of unspecified duration, initial encounter: Secondary | ICD-10-CM | POA: Diagnosis not present

## 2020-02-01 DIAGNOSIS — Z96641 Presence of right artificial hip joint: Secondary | ICD-10-CM | POA: Diagnosis not present

## 2020-02-01 DIAGNOSIS — Z86711 Personal history of pulmonary embolism: Secondary | ICD-10-CM | POA: Diagnosis not present

## 2020-02-01 DIAGNOSIS — R4182 Altered mental status, unspecified: Secondary | ICD-10-CM | POA: Diagnosis not present

## 2020-02-01 DIAGNOSIS — Z6837 Body mass index (BMI) 37.0-37.9, adult: Secondary | ICD-10-CM | POA: Diagnosis not present

## 2020-02-01 DIAGNOSIS — I2510 Atherosclerotic heart disease of native coronary artery without angina pectoris: Secondary | ICD-10-CM | POA: Diagnosis not present

## 2020-02-01 DIAGNOSIS — Z538 Procedure and treatment not carried out for other reasons: Secondary | ICD-10-CM | POA: Diagnosis not present

## 2020-02-01 DIAGNOSIS — J40 Bronchitis, not specified as acute or chronic: Secondary | ICD-10-CM | POA: Diagnosis not present

## 2020-02-01 DIAGNOSIS — M189 Osteoarthritis of first carpometacarpal joint, unspecified: Secondary | ICD-10-CM | POA: Diagnosis not present

## 2020-02-01 DIAGNOSIS — R5383 Other fatigue: Secondary | ICD-10-CM | POA: Diagnosis not present

## 2020-02-01 DIAGNOSIS — F1010 Alcohol abuse, uncomplicated: Secondary | ICD-10-CM | POA: Diagnosis not present

## 2020-02-01 DIAGNOSIS — R1110 Vomiting, unspecified: Secondary | ICD-10-CM | POA: Diagnosis not present

## 2020-02-01 DIAGNOSIS — E785 Hyperlipidemia, unspecified: Secondary | ICD-10-CM | POA: Diagnosis not present

## 2020-02-01 DIAGNOSIS — L821 Other seborrheic keratosis: Secondary | ICD-10-CM | POA: Diagnosis not present

## 2020-02-01 DIAGNOSIS — M5417 Radiculopathy, lumbosacral region: Secondary | ICD-10-CM | POA: Diagnosis not present

## 2020-02-01 DIAGNOSIS — I4891 Unspecified atrial fibrillation: Secondary | ICD-10-CM | POA: Diagnosis not present

## 2020-02-01 DIAGNOSIS — N289 Disorder of kidney and ureter, unspecified: Secondary | ICD-10-CM | POA: Diagnosis not present

## 2020-02-01 DIAGNOSIS — R9431 Abnormal electrocardiogram [ECG] [EKG]: Secondary | ICD-10-CM | POA: Diagnosis not present

## 2020-02-01 DIAGNOSIS — R600 Localized edema: Secondary | ICD-10-CM | POA: Diagnosis not present

## 2020-02-01 DIAGNOSIS — Q828 Other specified congenital malformations of skin: Secondary | ICD-10-CM | POA: Diagnosis not present

## 2020-02-01 DIAGNOSIS — J42 Unspecified chronic bronchitis: Secondary | ICD-10-CM | POA: Diagnosis not present

## 2020-02-01 DIAGNOSIS — R41841 Cognitive communication deficit: Secondary | ICD-10-CM | POA: Diagnosis not present

## 2020-02-01 DIAGNOSIS — F322 Major depressive disorder, single episode, severe without psychotic features: Secondary | ICD-10-CM | POA: Diagnosis not present

## 2020-02-01 DIAGNOSIS — Z90710 Acquired absence of both cervix and uterus: Secondary | ICD-10-CM | POA: Diagnosis not present

## 2020-02-01 DIAGNOSIS — I6330 Cerebral infarction due to thrombosis of unspecified cerebral artery: Secondary | ICD-10-CM | POA: Diagnosis not present

## 2020-02-01 DIAGNOSIS — G4733 Obstructive sleep apnea (adult) (pediatric): Secondary | ICD-10-CM | POA: Diagnosis not present

## 2020-02-01 DIAGNOSIS — R7401 Elevation of levels of liver transaminase levels: Secondary | ICD-10-CM | POA: Diagnosis not present

## 2020-02-01 DIAGNOSIS — R2689 Other abnormalities of gait and mobility: Secondary | ICD-10-CM | POA: Diagnosis not present

## 2020-02-01 DIAGNOSIS — M542 Cervicalgia: Secondary | ICD-10-CM | POA: Diagnosis not present

## 2020-02-01 DIAGNOSIS — M4722 Other spondylosis with radiculopathy, cervical region: Secondary | ICD-10-CM | POA: Diagnosis not present

## 2020-02-01 DIAGNOSIS — Z96612 Presence of left artificial shoulder joint: Secondary | ICD-10-CM | POA: Diagnosis not present

## 2020-02-01 DIAGNOSIS — M25562 Pain in left knee: Secondary | ICD-10-CM | POA: Diagnosis not present

## 2020-02-01 DIAGNOSIS — M109 Gout, unspecified: Secondary | ICD-10-CM | POA: Diagnosis not present

## 2020-02-01 DIAGNOSIS — I9589 Other hypotension: Secondary | ICD-10-CM | POA: Diagnosis not present

## 2020-02-01 DIAGNOSIS — M6281 Muscle weakness (generalized): Secondary | ICD-10-CM | POA: Diagnosis not present

## 2020-02-01 DIAGNOSIS — Z9221 Personal history of antineoplastic chemotherapy: Secondary | ICD-10-CM | POA: Diagnosis not present

## 2020-02-01 DIAGNOSIS — Z7982 Long term (current) use of aspirin: Secondary | ICD-10-CM | POA: Diagnosis not present

## 2020-02-01 DIAGNOSIS — F17200 Nicotine dependence, unspecified, uncomplicated: Secondary | ICD-10-CM | POA: Diagnosis not present

## 2020-02-01 DIAGNOSIS — Z6830 Body mass index (BMI) 30.0-30.9, adult: Secondary | ICD-10-CM | POA: Diagnosis not present

## 2020-02-01 DIAGNOSIS — S42141D Displaced fracture of glenoid cavity of scapula, right shoulder, subsequent encounter for fracture with routine healing: Secondary | ICD-10-CM | POA: Diagnosis not present

## 2020-02-01 DIAGNOSIS — K4020 Bilateral inguinal hernia, without obstruction or gangrene, not specified as recurrent: Secondary | ICD-10-CM | POA: Diagnosis not present

## 2020-02-01 DIAGNOSIS — D0471 Carcinoma in situ of skin of right lower limb, including hip: Secondary | ICD-10-CM | POA: Diagnosis not present

## 2020-02-01 DIAGNOSIS — I2699 Other pulmonary embolism without acute cor pulmonale: Secondary | ICD-10-CM | POA: Diagnosis not present

## 2020-02-01 DIAGNOSIS — R519 Headache, unspecified: Secondary | ICD-10-CM | POA: Diagnosis not present

## 2020-02-01 DIAGNOSIS — E662 Morbid (severe) obesity with alveolar hypoventilation: Secondary | ICD-10-CM | POA: Diagnosis not present

## 2020-02-01 DIAGNOSIS — R278 Other lack of coordination: Secondary | ICD-10-CM | POA: Diagnosis not present

## 2020-02-01 DIAGNOSIS — M25511 Pain in right shoulder: Secondary | ICD-10-CM | POA: Diagnosis not present

## 2020-02-01 DIAGNOSIS — L570 Actinic keratosis: Secondary | ICD-10-CM | POA: Diagnosis not present

## 2020-02-01 DIAGNOSIS — Z66 Do not resuscitate: Secondary | ICD-10-CM | POA: Diagnosis not present

## 2020-02-01 DIAGNOSIS — M461 Sacroiliitis, not elsewhere classified: Secondary | ICD-10-CM | POA: Diagnosis not present

## 2020-02-01 DIAGNOSIS — F329 Major depressive disorder, single episode, unspecified: Secondary | ICD-10-CM | POA: Diagnosis not present

## 2020-02-01 DIAGNOSIS — E1165 Type 2 diabetes mellitus with hyperglycemia: Secondary | ICD-10-CM | POA: Diagnosis not present

## 2020-02-01 DIAGNOSIS — Z7401 Bed confinement status: Secondary | ICD-10-CM | POA: Diagnosis not present

## 2020-02-01 DIAGNOSIS — Z9181 History of falling: Secondary | ICD-10-CM | POA: Diagnosis not present

## 2020-02-01 DIAGNOSIS — K317 Polyp of stomach and duodenum: Secondary | ICD-10-CM | POA: Diagnosis not present

## 2020-02-01 DIAGNOSIS — I5022 Chronic systolic (congestive) heart failure: Secondary | ICD-10-CM | POA: Diagnosis not present

## 2020-02-01 DIAGNOSIS — M2010 Hallux valgus (acquired), unspecified foot: Secondary | ICD-10-CM | POA: Diagnosis not present

## 2020-02-01 DIAGNOSIS — Z5321 Procedure and treatment not carried out due to patient leaving prior to being seen by health care provider: Secondary | ICD-10-CM | POA: Diagnosis not present

## 2020-02-01 DIAGNOSIS — I700 Atherosclerosis of aorta: Secondary | ICD-10-CM | POA: Diagnosis not present

## 2020-02-01 DIAGNOSIS — F321 Major depressive disorder, single episode, moderate: Secondary | ICD-10-CM | POA: Diagnosis not present

## 2020-02-01 DIAGNOSIS — N200 Calculus of kidney: Secondary | ICD-10-CM | POA: Diagnosis not present

## 2020-02-01 DIAGNOSIS — M25642 Stiffness of left hand, not elsewhere classified: Secondary | ICD-10-CM | POA: Diagnosis not present

## 2020-02-01 DIAGNOSIS — Z8379 Family history of other diseases of the digestive system: Secondary | ICD-10-CM | POA: Diagnosis not present

## 2020-02-01 DIAGNOSIS — R7989 Other specified abnormal findings of blood chemistry: Secondary | ICD-10-CM | POA: Diagnosis not present

## 2020-02-01 DIAGNOSIS — Z85820 Personal history of malignant melanoma of skin: Secondary | ICD-10-CM | POA: Diagnosis not present

## 2020-02-01 DIAGNOSIS — Z90722 Acquired absence of ovaries, bilateral: Secondary | ICD-10-CM | POA: Diagnosis not present

## 2020-02-01 DIAGNOSIS — M71341 Other bursal cyst, right hand: Secondary | ICD-10-CM | POA: Diagnosis not present

## 2020-02-01 DIAGNOSIS — M5416 Radiculopathy, lumbar region: Secondary | ICD-10-CM | POA: Diagnosis not present

## 2020-02-01 DIAGNOSIS — Z03818 Encounter for observation for suspected exposure to other biological agents ruled out: Secondary | ICD-10-CM | POA: Diagnosis not present

## 2020-02-01 DIAGNOSIS — J189 Pneumonia, unspecified organism: Secondary | ICD-10-CM | POA: Diagnosis not present

## 2020-02-01 DIAGNOSIS — S92352A Displaced fracture of fifth metatarsal bone, left foot, initial encounter for closed fracture: Secondary | ICD-10-CM | POA: Diagnosis not present

## 2020-02-01 DIAGNOSIS — U071 COVID-19: Secondary | ICD-10-CM | POA: Diagnosis not present

## 2020-02-01 DIAGNOSIS — I517 Cardiomegaly: Secondary | ICD-10-CM | POA: Diagnosis not present

## 2020-02-01 DIAGNOSIS — K219 Gastro-esophageal reflux disease without esophagitis: Secondary | ICD-10-CM | POA: Diagnosis not present

## 2020-02-01 DIAGNOSIS — Z96651 Presence of right artificial knee joint: Secondary | ICD-10-CM | POA: Diagnosis not present

## 2020-02-01 DIAGNOSIS — S76011D Strain of muscle, fascia and tendon of right hip, subsequent encounter: Secondary | ICD-10-CM | POA: Diagnosis not present

## 2020-02-01 DIAGNOSIS — M479 Spondylosis, unspecified: Secondary | ICD-10-CM | POA: Diagnosis not present

## 2020-02-01 DIAGNOSIS — M47816 Spondylosis without myelopathy or radiculopathy, lumbar region: Secondary | ICD-10-CM | POA: Diagnosis not present

## 2020-02-01 DIAGNOSIS — Z124 Encounter for screening for malignant neoplasm of cervix: Secondary | ICD-10-CM | POA: Diagnosis not present

## 2020-02-01 DIAGNOSIS — C679 Malignant neoplasm of bladder, unspecified: Secondary | ICD-10-CM | POA: Diagnosis not present

## 2020-02-01 DIAGNOSIS — R0602 Shortness of breath: Secondary | ICD-10-CM | POA: Diagnosis not present

## 2020-02-01 DIAGNOSIS — G8918 Other acute postprocedural pain: Secondary | ICD-10-CM | POA: Diagnosis not present

## 2020-02-01 DIAGNOSIS — R110 Nausea: Secondary | ICD-10-CM | POA: Diagnosis not present

## 2020-02-01 DIAGNOSIS — R1310 Dysphagia, unspecified: Secondary | ICD-10-CM | POA: Diagnosis not present

## 2020-02-01 DIAGNOSIS — M25551 Pain in right hip: Secondary | ICD-10-CM | POA: Diagnosis not present

## 2020-02-01 DIAGNOSIS — R079 Chest pain, unspecified: Secondary | ICD-10-CM | POA: Diagnosis not present

## 2020-02-01 DIAGNOSIS — I252 Old myocardial infarction: Secondary | ICD-10-CM | POA: Diagnosis not present

## 2020-02-01 DIAGNOSIS — N941 Unspecified dyspareunia: Secondary | ICD-10-CM | POA: Diagnosis not present

## 2020-02-01 DIAGNOSIS — S143XXD Injury of brachial plexus, subsequent encounter: Secondary | ICD-10-CM | POA: Diagnosis not present

## 2020-02-01 DIAGNOSIS — K922 Gastrointestinal hemorrhage, unspecified: Secondary | ICD-10-CM | POA: Diagnosis not present

## 2020-02-01 DIAGNOSIS — I120 Hypertensive chronic kidney disease with stage 5 chronic kidney disease or end stage renal disease: Secondary | ICD-10-CM | POA: Diagnosis not present

## 2020-02-01 DIAGNOSIS — R531 Weakness: Secondary | ICD-10-CM | POA: Diagnosis not present

## 2020-02-01 DIAGNOSIS — R140 Abdominal distension (gaseous): Secondary | ICD-10-CM | POA: Diagnosis not present

## 2020-02-01 DIAGNOSIS — M62561 Muscle wasting and atrophy, not elsewhere classified, right lower leg: Secondary | ICD-10-CM | POA: Diagnosis not present

## 2020-02-01 DIAGNOSIS — M25622 Stiffness of left elbow, not elsewhere classified: Secondary | ICD-10-CM | POA: Diagnosis not present

## 2020-02-01 DIAGNOSIS — S91302A Unspecified open wound, left foot, initial encounter: Secondary | ICD-10-CM | POA: Diagnosis not present

## 2020-02-01 DIAGNOSIS — R509 Fever, unspecified: Secondary | ICD-10-CM | POA: Diagnosis not present

## 2020-02-01 DIAGNOSIS — Z006 Encounter for examination for normal comparison and control in clinical research program: Secondary | ICD-10-CM | POA: Diagnosis not present

## 2020-02-01 DIAGNOSIS — R29701 NIHSS score 1: Secondary | ICD-10-CM | POA: Diagnosis not present

## 2020-02-01 DIAGNOSIS — R339 Retention of urine, unspecified: Secondary | ICD-10-CM | POA: Diagnosis not present

## 2020-02-01 DIAGNOSIS — G992 Myelopathy in diseases classified elsewhere: Secondary | ICD-10-CM | POA: Diagnosis not present

## 2020-02-01 DIAGNOSIS — K449 Diaphragmatic hernia without obstruction or gangrene: Secondary | ICD-10-CM | POA: Diagnosis not present

## 2020-02-01 DIAGNOSIS — Z8249 Family history of ischemic heart disease and other diseases of the circulatory system: Secondary | ICD-10-CM | POA: Diagnosis not present

## 2020-02-01 DIAGNOSIS — I5032 Chronic diastolic (congestive) heart failure: Secondary | ICD-10-CM | POA: Diagnosis not present

## 2020-02-01 DIAGNOSIS — R7309 Other abnormal glucose: Secondary | ICD-10-CM | POA: Diagnosis not present

## 2020-02-01 DIAGNOSIS — S32591D Other specified fracture of right pubis, subsequent encounter for fracture with routine healing: Secondary | ICD-10-CM | POA: Diagnosis not present

## 2020-02-01 DIAGNOSIS — L820 Inflamed seborrheic keratosis: Secondary | ICD-10-CM | POA: Diagnosis not present

## 2020-02-01 DIAGNOSIS — M25662 Stiffness of left knee, not elsewhere classified: Secondary | ICD-10-CM | POA: Diagnosis not present

## 2020-02-01 DIAGNOSIS — M25651 Stiffness of right hip, not elsewhere classified: Secondary | ICD-10-CM | POA: Diagnosis not present

## 2020-02-01 DIAGNOSIS — Z430 Encounter for attention to tracheostomy: Secondary | ICD-10-CM | POA: Diagnosis not present

## 2020-02-01 DIAGNOSIS — E890 Postprocedural hypothyroidism: Secondary | ICD-10-CM | POA: Diagnosis not present

## 2020-02-01 DIAGNOSIS — C61 Malignant neoplasm of prostate: Secondary | ICD-10-CM | POA: Diagnosis not present

## 2020-02-01 DIAGNOSIS — G43009 Migraine without aura, not intractable, without status migrainosus: Secondary | ICD-10-CM | POA: Diagnosis not present

## 2020-02-01 DIAGNOSIS — I513 Intracardiac thrombosis, not elsewhere classified: Secondary | ICD-10-CM | POA: Diagnosis not present

## 2020-02-01 DIAGNOSIS — J4520 Mild intermittent asthma, uncomplicated: Secondary | ICD-10-CM | POA: Diagnosis not present

## 2020-02-01 DIAGNOSIS — K3189 Other diseases of stomach and duodenum: Secondary | ICD-10-CM | POA: Diagnosis not present

## 2020-02-01 DIAGNOSIS — R252 Cramp and spasm: Secondary | ICD-10-CM | POA: Diagnosis not present

## 2020-02-01 DIAGNOSIS — I82412 Acute embolism and thrombosis of left femoral vein: Secondary | ICD-10-CM | POA: Diagnosis not present

## 2020-02-01 DIAGNOSIS — Z8042 Family history of malignant neoplasm of prostate: Secondary | ICD-10-CM | POA: Diagnosis not present

## 2020-02-01 DIAGNOSIS — C50912 Malignant neoplasm of unspecified site of left female breast: Secondary | ICD-10-CM | POA: Diagnosis not present

## 2020-02-01 DIAGNOSIS — E1140 Type 2 diabetes mellitus with diabetic neuropathy, unspecified: Secondary | ICD-10-CM | POA: Diagnosis not present

## 2020-02-01 DIAGNOSIS — Z7984 Long term (current) use of oral hypoglycemic drugs: Secondary | ICD-10-CM | POA: Diagnosis not present

## 2020-02-01 DIAGNOSIS — E1142 Type 2 diabetes mellitus with diabetic polyneuropathy: Secondary | ICD-10-CM | POA: Diagnosis not present

## 2020-02-01 DIAGNOSIS — F418 Other specified anxiety disorders: Secondary | ICD-10-CM | POA: Diagnosis not present

## 2020-02-01 DIAGNOSIS — R0609 Other forms of dyspnea: Secondary | ICD-10-CM | POA: Diagnosis not present

## 2020-02-01 DIAGNOSIS — Z951 Presence of aortocoronary bypass graft: Secondary | ICD-10-CM | POA: Diagnosis not present

## 2020-02-01 DIAGNOSIS — F410 Panic disorder [episodic paroxysmal anxiety] without agoraphobia: Secondary | ICD-10-CM | POA: Diagnosis not present

## 2020-02-01 DIAGNOSIS — Z48816 Encounter for surgical aftercare following surgery on the genitourinary system: Secondary | ICD-10-CM | POA: Diagnosis not present

## 2020-02-01 DIAGNOSIS — N179 Acute kidney failure, unspecified: Secondary | ICD-10-CM | POA: Diagnosis not present

## 2020-02-01 DIAGNOSIS — Z9119 Patient's noncompliance with other medical treatment and regimen: Secondary | ICD-10-CM | POA: Diagnosis not present

## 2020-02-01 DIAGNOSIS — M7541 Impingement syndrome of right shoulder: Secondary | ICD-10-CM | POA: Diagnosis not present

## 2020-02-01 DIAGNOSIS — E781 Pure hyperglyceridemia: Secondary | ICD-10-CM | POA: Diagnosis not present

## 2020-02-01 DIAGNOSIS — F4310 Post-traumatic stress disorder, unspecified: Secondary | ICD-10-CM | POA: Diagnosis not present

## 2020-02-02 DIAGNOSIS — R252 Cramp and spasm: Secondary | ICD-10-CM | POA: Diagnosis not present

## 2020-02-02 DIAGNOSIS — M169 Osteoarthritis of hip, unspecified: Secondary | ICD-10-CM | POA: Diagnosis not present

## 2020-02-02 DIAGNOSIS — E038 Other specified hypothyroidism: Secondary | ICD-10-CM | POA: Diagnosis not present

## 2020-02-02 DIAGNOSIS — I6602 Occlusion and stenosis of left middle cerebral artery: Secondary | ICD-10-CM | POA: Diagnosis not present

## 2020-02-02 DIAGNOSIS — R296 Repeated falls: Secondary | ICD-10-CM | POA: Diagnosis not present

## 2020-02-02 DIAGNOSIS — D6862 Lupus anticoagulant syndrome: Secondary | ICD-10-CM | POA: Diagnosis not present

## 2020-02-02 DIAGNOSIS — K2100 Gastro-esophageal reflux disease with esophagitis, without bleeding: Secondary | ICD-10-CM | POA: Diagnosis not present

## 2020-02-02 DIAGNOSIS — M4326 Fusion of spine, lumbar region: Secondary | ICD-10-CM | POA: Diagnosis not present

## 2020-02-02 DIAGNOSIS — H2511 Age-related nuclear cataract, right eye: Secondary | ICD-10-CM | POA: Diagnosis not present

## 2020-02-02 DIAGNOSIS — S30860D Insect bite (nonvenomous) of lower back and pelvis, subsequent encounter: Secondary | ICD-10-CM | POA: Diagnosis not present

## 2020-02-02 DIAGNOSIS — C50511 Malignant neoplasm of lower-outer quadrant of right female breast: Secondary | ICD-10-CM | POA: Diagnosis not present

## 2020-02-02 DIAGNOSIS — C884 Extranodal marginal zone B-cell lymphoma of mucosa-associated lymphoid tissue [MALT-lymphoma]: Secondary | ICD-10-CM | POA: Diagnosis not present

## 2020-02-02 DIAGNOSIS — A400 Sepsis due to streptococcus, group A: Secondary | ICD-10-CM | POA: Diagnosis not present

## 2020-02-02 DIAGNOSIS — N189 Chronic kidney disease, unspecified: Secondary | ICD-10-CM | POA: Diagnosis not present

## 2020-02-02 DIAGNOSIS — E559 Vitamin D deficiency, unspecified: Secondary | ICD-10-CM | POA: Diagnosis not present

## 2020-02-02 DIAGNOSIS — N10 Acute tubulo-interstitial nephritis: Secondary | ICD-10-CM | POA: Diagnosis not present

## 2020-02-02 DIAGNOSIS — M79604 Pain in right leg: Secondary | ICD-10-CM | POA: Diagnosis not present

## 2020-02-02 DIAGNOSIS — E1129 Type 2 diabetes mellitus with other diabetic kidney complication: Secondary | ICD-10-CM | POA: Diagnosis not present

## 2020-02-02 DIAGNOSIS — R339 Retention of urine, unspecified: Secondary | ICD-10-CM | POA: Diagnosis not present

## 2020-02-02 DIAGNOSIS — R062 Wheezing: Secondary | ICD-10-CM | POA: Diagnosis not present

## 2020-02-02 DIAGNOSIS — C786 Secondary malignant neoplasm of retroperitoneum and peritoneum: Secondary | ICD-10-CM | POA: Diagnosis not present

## 2020-02-02 DIAGNOSIS — R0789 Other chest pain: Secondary | ICD-10-CM | POA: Diagnosis not present

## 2020-02-02 DIAGNOSIS — D509 Iron deficiency anemia, unspecified: Secondary | ICD-10-CM | POA: Diagnosis not present

## 2020-02-02 DIAGNOSIS — E7849 Other hyperlipidemia: Secondary | ICD-10-CM | POA: Diagnosis not present

## 2020-02-02 DIAGNOSIS — Z86718 Personal history of other venous thrombosis and embolism: Secondary | ICD-10-CM | POA: Diagnosis not present

## 2020-02-02 DIAGNOSIS — S069X9A Unspecified intracranial injury with loss of consciousness of unspecified duration, initial encounter: Secondary | ICD-10-CM | POA: Diagnosis not present

## 2020-02-02 DIAGNOSIS — M25411 Effusion, right shoulder: Secondary | ICD-10-CM | POA: Diagnosis not present

## 2020-02-02 DIAGNOSIS — E11311 Type 2 diabetes mellitus with unspecified diabetic retinopathy with macular edema: Secondary | ICD-10-CM | POA: Diagnosis not present

## 2020-02-02 DIAGNOSIS — E063 Autoimmune thyroiditis: Secondary | ICD-10-CM | POA: Diagnosis not present

## 2020-02-02 DIAGNOSIS — D631 Anemia in chronic kidney disease: Secondary | ICD-10-CM | POA: Diagnosis not present

## 2020-02-02 DIAGNOSIS — K759 Inflammatory liver disease, unspecified: Secondary | ICD-10-CM | POA: Diagnosis not present

## 2020-02-02 DIAGNOSIS — B353 Tinea pedis: Secondary | ICD-10-CM | POA: Diagnosis not present

## 2020-02-02 DIAGNOSIS — R41841 Cognitive communication deficit: Secondary | ICD-10-CM | POA: Diagnosis not present

## 2020-02-02 DIAGNOSIS — H43811 Vitreous degeneration, right eye: Secondary | ICD-10-CM | POA: Diagnosis not present

## 2020-02-02 DIAGNOSIS — H02403 Unspecified ptosis of bilateral eyelids: Secondary | ICD-10-CM | POA: Diagnosis not present

## 2020-02-02 DIAGNOSIS — T8489XA Other specified complication of internal orthopedic prosthetic devices, implants and grafts, initial encounter: Secondary | ICD-10-CM | POA: Diagnosis not present

## 2020-02-02 DIAGNOSIS — R2231 Localized swelling, mass and lump, right upper limb: Secondary | ICD-10-CM | POA: Diagnosis not present

## 2020-02-02 DIAGNOSIS — F419 Anxiety disorder, unspecified: Secondary | ICD-10-CM | POA: Diagnosis not present

## 2020-02-02 DIAGNOSIS — Z853 Personal history of malignant neoplasm of breast: Secondary | ICD-10-CM | POA: Diagnosis not present

## 2020-02-02 DIAGNOSIS — M25572 Pain in left ankle and joints of left foot: Secondary | ICD-10-CM | POA: Diagnosis not present

## 2020-02-02 DIAGNOSIS — F3342 Major depressive disorder, recurrent, in full remission: Secondary | ICD-10-CM | POA: Diagnosis not present

## 2020-02-02 DIAGNOSIS — F319 Bipolar disorder, unspecified: Secondary | ICD-10-CM | POA: Diagnosis not present

## 2020-02-02 DIAGNOSIS — J4531 Mild persistent asthma with (acute) exacerbation: Secondary | ICD-10-CM | POA: Diagnosis not present

## 2020-02-02 DIAGNOSIS — Z125 Encounter for screening for malignant neoplasm of prostate: Secondary | ICD-10-CM | POA: Diagnosis not present

## 2020-02-02 DIAGNOSIS — Z85820 Personal history of malignant melanoma of skin: Secondary | ICD-10-CM | POA: Diagnosis not present

## 2020-02-02 DIAGNOSIS — F1120 Opioid dependence, uncomplicated: Secondary | ICD-10-CM | POA: Diagnosis not present

## 2020-02-02 DIAGNOSIS — Z950 Presence of cardiac pacemaker: Secondary | ICD-10-CM | POA: Diagnosis not present

## 2020-02-02 DIAGNOSIS — M19012 Primary osteoarthritis, left shoulder: Secondary | ICD-10-CM | POA: Diagnosis not present

## 2020-02-02 DIAGNOSIS — Z8674 Personal history of sudden cardiac arrest: Secondary | ICD-10-CM | POA: Diagnosis not present

## 2020-02-02 DIAGNOSIS — E1122 Type 2 diabetes mellitus with diabetic chronic kidney disease: Secondary | ICD-10-CM | POA: Diagnosis not present

## 2020-02-02 DIAGNOSIS — G5602 Carpal tunnel syndrome, left upper limb: Secondary | ICD-10-CM | POA: Diagnosis not present

## 2020-02-02 DIAGNOSIS — M25511 Pain in right shoulder: Secondary | ICD-10-CM | POA: Diagnosis not present

## 2020-02-02 DIAGNOSIS — C44729 Squamous cell carcinoma of skin of left lower limb, including hip: Secondary | ICD-10-CM | POA: Diagnosis not present

## 2020-02-02 DIAGNOSIS — Z803 Family history of malignant neoplasm of breast: Secondary | ICD-10-CM | POA: Diagnosis not present

## 2020-02-02 DIAGNOSIS — D839 Common variable immunodeficiency, unspecified: Secondary | ICD-10-CM | POA: Diagnosis not present

## 2020-02-02 DIAGNOSIS — Z9181 History of falling: Secondary | ICD-10-CM | POA: Diagnosis not present

## 2020-02-02 DIAGNOSIS — M66862 Spontaneous rupture of other tendons, left lower leg: Secondary | ICD-10-CM | POA: Diagnosis not present

## 2020-02-02 DIAGNOSIS — Z712 Person consulting for explanation of examination or test findings: Secondary | ICD-10-CM | POA: Diagnosis not present

## 2020-02-02 DIAGNOSIS — M4722 Other spondylosis with radiculopathy, cervical region: Secondary | ICD-10-CM | POA: Diagnosis not present

## 2020-02-02 DIAGNOSIS — M9904 Segmental and somatic dysfunction of sacral region: Secondary | ICD-10-CM | POA: Diagnosis not present

## 2020-02-02 DIAGNOSIS — I2510 Atherosclerotic heart disease of native coronary artery without angina pectoris: Secondary | ICD-10-CM | POA: Diagnosis not present

## 2020-02-02 DIAGNOSIS — M79605 Pain in left leg: Secondary | ICD-10-CM | POA: Diagnosis not present

## 2020-02-02 DIAGNOSIS — I482 Chronic atrial fibrillation, unspecified: Secondary | ICD-10-CM | POA: Diagnosis not present

## 2020-02-02 DIAGNOSIS — Z7982 Long term (current) use of aspirin: Secondary | ICD-10-CM | POA: Diagnosis not present

## 2020-02-02 DIAGNOSIS — Z9049 Acquired absence of other specified parts of digestive tract: Secondary | ICD-10-CM | POA: Diagnosis not present

## 2020-02-02 DIAGNOSIS — N131 Hydronephrosis with ureteral stricture, not elsewhere classified: Secondary | ICD-10-CM | POA: Diagnosis not present

## 2020-02-02 DIAGNOSIS — D539 Nutritional anemia, unspecified: Secondary | ICD-10-CM | POA: Diagnosis not present

## 2020-02-02 DIAGNOSIS — E789 Disorder of lipoprotein metabolism, unspecified: Secondary | ICD-10-CM | POA: Diagnosis not present

## 2020-02-02 DIAGNOSIS — S40022A Contusion of left upper arm, initial encounter: Secondary | ICD-10-CM | POA: Diagnosis not present

## 2020-02-02 DIAGNOSIS — M545 Low back pain: Secondary | ICD-10-CM | POA: Diagnosis not present

## 2020-02-02 DIAGNOSIS — D2272 Melanocytic nevi of left lower limb, including hip: Secondary | ICD-10-CM | POA: Diagnosis not present

## 2020-02-02 DIAGNOSIS — Z7984 Long term (current) use of oral hypoglycemic drugs: Secondary | ICD-10-CM | POA: Diagnosis not present

## 2020-02-02 DIAGNOSIS — R55 Syncope and collapse: Secondary | ICD-10-CM | POA: Diagnosis not present

## 2020-02-02 DIAGNOSIS — L309 Dermatitis, unspecified: Secondary | ICD-10-CM | POA: Diagnosis not present

## 2020-02-02 DIAGNOSIS — Z7401 Bed confinement status: Secondary | ICD-10-CM | POA: Diagnosis not present

## 2020-02-02 DIAGNOSIS — Z954 Presence of other heart-valve replacement: Secondary | ICD-10-CM | POA: Diagnosis not present

## 2020-02-02 DIAGNOSIS — M25611 Stiffness of right shoulder, not elsewhere classified: Secondary | ICD-10-CM | POA: Diagnosis not present

## 2020-02-02 DIAGNOSIS — M25571 Pain in right ankle and joints of right foot: Secondary | ICD-10-CM | POA: Diagnosis not present

## 2020-02-02 DIAGNOSIS — M7062 Trochanteric bursitis, left hip: Secondary | ICD-10-CM | POA: Diagnosis not present

## 2020-02-02 DIAGNOSIS — H5319 Other subjective visual disturbances: Secondary | ICD-10-CM | POA: Diagnosis not present

## 2020-02-02 DIAGNOSIS — I639 Cerebral infarction, unspecified: Secondary | ICD-10-CM | POA: Diagnosis not present

## 2020-02-02 DIAGNOSIS — I129 Hypertensive chronic kidney disease with stage 1 through stage 4 chronic kidney disease, or unspecified chronic kidney disease: Secondary | ICD-10-CM | POA: Diagnosis not present

## 2020-02-02 DIAGNOSIS — K429 Umbilical hernia without obstruction or gangrene: Secondary | ICD-10-CM | POA: Diagnosis not present

## 2020-02-02 DIAGNOSIS — M6281 Muscle weakness (generalized): Secondary | ICD-10-CM | POA: Diagnosis not present

## 2020-02-02 DIAGNOSIS — G3184 Mild cognitive impairment, so stated: Secondary | ICD-10-CM | POA: Diagnosis not present

## 2020-02-02 DIAGNOSIS — Z03818 Encounter for observation for suspected exposure to other biological agents ruled out: Secondary | ICD-10-CM | POA: Diagnosis not present

## 2020-02-02 DIAGNOSIS — Z86010 Personal history of colonic polyps: Secondary | ICD-10-CM | POA: Diagnosis not present

## 2020-02-02 DIAGNOSIS — H832X9 Labyrinthine dysfunction, unspecified ear: Secondary | ICD-10-CM | POA: Diagnosis not present

## 2020-02-02 DIAGNOSIS — Z8673 Personal history of transient ischemic attack (TIA), and cerebral infarction without residual deficits: Secondary | ICD-10-CM | POA: Diagnosis not present

## 2020-02-02 DIAGNOSIS — L817 Pigmented purpuric dermatosis: Secondary | ICD-10-CM | POA: Diagnosis not present

## 2020-02-02 DIAGNOSIS — D638 Anemia in other chronic diseases classified elsewhere: Secondary | ICD-10-CM | POA: Diagnosis not present

## 2020-02-02 DIAGNOSIS — R42 Dizziness and giddiness: Secondary | ICD-10-CM | POA: Diagnosis not present

## 2020-02-02 DIAGNOSIS — Z801 Family history of malignant neoplasm of trachea, bronchus and lung: Secondary | ICD-10-CM | POA: Diagnosis not present

## 2020-02-02 DIAGNOSIS — M47816 Spondylosis without myelopathy or radiculopathy, lumbar region: Secondary | ICD-10-CM | POA: Diagnosis not present

## 2020-02-02 DIAGNOSIS — I272 Pulmonary hypertension, unspecified: Secondary | ICD-10-CM | POA: Diagnosis not present

## 2020-02-02 DIAGNOSIS — R5382 Chronic fatigue, unspecified: Secondary | ICD-10-CM | POA: Diagnosis not present

## 2020-02-02 DIAGNOSIS — H9193 Unspecified hearing loss, bilateral: Secondary | ICD-10-CM | POA: Diagnosis not present

## 2020-02-02 DIAGNOSIS — E118 Type 2 diabetes mellitus with unspecified complications: Secondary | ICD-10-CM | POA: Diagnosis not present

## 2020-02-02 DIAGNOSIS — F325 Major depressive disorder, single episode, in full remission: Secondary | ICD-10-CM | POA: Diagnosis not present

## 2020-02-02 DIAGNOSIS — I83893 Varicose veins of bilateral lower extremities with other complications: Secondary | ICD-10-CM | POA: Diagnosis not present

## 2020-02-02 DIAGNOSIS — Z794 Long term (current) use of insulin: Secondary | ICD-10-CM | POA: Diagnosis not present

## 2020-02-02 DIAGNOSIS — N183 Chronic kidney disease, stage 3 unspecified: Secondary | ICD-10-CM | POA: Diagnosis not present

## 2020-02-02 DIAGNOSIS — Z951 Presence of aortocoronary bypass graft: Secondary | ICD-10-CM | POA: Diagnosis not present

## 2020-02-02 DIAGNOSIS — N182 Chronic kidney disease, stage 2 (mild): Secondary | ICD-10-CM | POA: Diagnosis not present

## 2020-02-02 DIAGNOSIS — G4721 Circadian rhythm sleep disorder, delayed sleep phase type: Secondary | ICD-10-CM | POA: Diagnosis not present

## 2020-02-02 DIAGNOSIS — H43391 Other vitreous opacities, right eye: Secondary | ICD-10-CM | POA: Diagnosis not present

## 2020-02-02 DIAGNOSIS — A4151 Sepsis due to Escherichia coli [E. coli]: Secondary | ICD-10-CM | POA: Diagnosis not present

## 2020-02-02 DIAGNOSIS — S92344A Nondisplaced fracture of fourth metatarsal bone, right foot, initial encounter for closed fracture: Secondary | ICD-10-CM | POA: Diagnosis not present

## 2020-02-02 DIAGNOSIS — M79621 Pain in right upper arm: Secondary | ICD-10-CM | POA: Diagnosis not present

## 2020-02-02 DIAGNOSIS — N184 Chronic kidney disease, stage 4 (severe): Secondary | ICD-10-CM | POA: Diagnosis not present

## 2020-02-02 DIAGNOSIS — Z85038 Personal history of other malignant neoplasm of large intestine: Secondary | ICD-10-CM | POA: Diagnosis not present

## 2020-02-02 DIAGNOSIS — M79671 Pain in right foot: Secondary | ICD-10-CM | POA: Diagnosis not present

## 2020-02-02 DIAGNOSIS — Z8619 Personal history of other infectious and parasitic diseases: Secondary | ICD-10-CM | POA: Diagnosis not present

## 2020-02-02 DIAGNOSIS — G43009 Migraine without aura, not intractable, without status migrainosus: Secondary | ICD-10-CM | POA: Diagnosis not present

## 2020-02-02 DIAGNOSIS — Z8249 Family history of ischemic heart disease and other diseases of the circulatory system: Secondary | ICD-10-CM | POA: Diagnosis not present

## 2020-02-02 DIAGNOSIS — C649 Malignant neoplasm of unspecified kidney, except renal pelvis: Secondary | ICD-10-CM | POA: Diagnosis not present

## 2020-02-02 DIAGNOSIS — Z5309 Procedure and treatment not carried out because of other contraindication: Secondary | ICD-10-CM | POA: Diagnosis not present

## 2020-02-02 DIAGNOSIS — D72829 Elevated white blood cell count, unspecified: Secondary | ICD-10-CM | POA: Diagnosis not present

## 2020-02-02 DIAGNOSIS — Z01419 Encounter for gynecological examination (general) (routine) without abnormal findings: Secondary | ICD-10-CM | POA: Diagnosis not present

## 2020-02-02 DIAGNOSIS — I5042 Chronic combined systolic (congestive) and diastolic (congestive) heart failure: Secondary | ICD-10-CM | POA: Diagnosis not present

## 2020-02-02 DIAGNOSIS — Z9981 Dependence on supplemental oxygen: Secondary | ICD-10-CM | POA: Diagnosis not present

## 2020-02-02 DIAGNOSIS — Z96652 Presence of left artificial knee joint: Secondary | ICD-10-CM | POA: Diagnosis not present

## 2020-02-02 DIAGNOSIS — M25551 Pain in right hip: Secondary | ICD-10-CM | POA: Diagnosis not present

## 2020-02-02 DIAGNOSIS — M179 Osteoarthritis of knee, unspecified: Secondary | ICD-10-CM | POA: Diagnosis not present

## 2020-02-02 DIAGNOSIS — M5441 Lumbago with sciatica, right side: Secondary | ICD-10-CM | POA: Diagnosis not present

## 2020-02-02 DIAGNOSIS — Z4789 Encounter for other orthopedic aftercare: Secondary | ICD-10-CM | POA: Diagnosis not present

## 2020-02-02 DIAGNOSIS — C44519 Basal cell carcinoma of skin of other part of trunk: Secondary | ICD-10-CM | POA: Diagnosis not present

## 2020-02-02 DIAGNOSIS — C799 Secondary malignant neoplasm of unspecified site: Secondary | ICD-10-CM | POA: Diagnosis not present

## 2020-02-02 DIAGNOSIS — Z170 Estrogen receptor positive status [ER+]: Secondary | ICD-10-CM | POA: Diagnosis not present

## 2020-02-02 DIAGNOSIS — Z823 Family history of stroke: Secondary | ICD-10-CM | POA: Diagnosis not present

## 2020-02-02 DIAGNOSIS — Z01810 Encounter for preprocedural cardiovascular examination: Secondary | ICD-10-CM | POA: Diagnosis not present

## 2020-02-02 DIAGNOSIS — M25531 Pain in right wrist: Secondary | ICD-10-CM | POA: Diagnosis not present

## 2020-02-02 DIAGNOSIS — M86271 Subacute osteomyelitis, right ankle and foot: Secondary | ICD-10-CM | POA: Diagnosis not present

## 2020-02-02 DIAGNOSIS — R1030 Lower abdominal pain, unspecified: Secondary | ICD-10-CM | POA: Diagnosis not present

## 2020-02-02 DIAGNOSIS — R000 Tachycardia, unspecified: Secondary | ICD-10-CM | POA: Diagnosis not present

## 2020-02-02 DIAGNOSIS — I878 Other specified disorders of veins: Secondary | ICD-10-CM | POA: Diagnosis not present

## 2020-02-02 DIAGNOSIS — Z885 Allergy status to narcotic agent status: Secondary | ICD-10-CM | POA: Diagnosis not present

## 2020-02-02 DIAGNOSIS — N1832 Chronic kidney disease, stage 3b: Secondary | ICD-10-CM | POA: Diagnosis not present

## 2020-02-02 DIAGNOSIS — G43909 Migraine, unspecified, not intractable, without status migrainosus: Secondary | ICD-10-CM | POA: Diagnosis not present

## 2020-02-02 DIAGNOSIS — R809 Proteinuria, unspecified: Secondary | ICD-10-CM | POA: Diagnosis not present

## 2020-02-02 DIAGNOSIS — Q394 Esophageal web: Secondary | ICD-10-CM | POA: Diagnosis not present

## 2020-02-02 DIAGNOSIS — M25461 Effusion, right knee: Secondary | ICD-10-CM | POA: Diagnosis not present

## 2020-02-02 DIAGNOSIS — M5136 Other intervertebral disc degeneration, lumbar region: Secondary | ICD-10-CM | POA: Diagnosis not present

## 2020-02-02 DIAGNOSIS — N390 Urinary tract infection, site not specified: Secondary | ICD-10-CM | POA: Diagnosis not present

## 2020-02-02 DIAGNOSIS — Z993 Dependence on wheelchair: Secondary | ICD-10-CM | POA: Diagnosis not present

## 2020-02-02 DIAGNOSIS — E871 Hypo-osmolality and hyponatremia: Secondary | ICD-10-CM | POA: Diagnosis not present

## 2020-02-02 DIAGNOSIS — I442 Atrioventricular block, complete: Secondary | ICD-10-CM | POA: Diagnosis not present

## 2020-02-02 DIAGNOSIS — M810 Age-related osteoporosis without current pathological fracture: Secondary | ICD-10-CM | POA: Diagnosis not present

## 2020-02-02 DIAGNOSIS — I061 Rheumatic aortic insufficiency: Secondary | ICD-10-CM | POA: Diagnosis not present

## 2020-02-02 DIAGNOSIS — N402 Nodular prostate without lower urinary tract symptoms: Secondary | ICD-10-CM | POA: Diagnosis not present

## 2020-02-02 DIAGNOSIS — Z79811 Long term (current) use of aromatase inhibitors: Secondary | ICD-10-CM | POA: Diagnosis not present

## 2020-02-02 DIAGNOSIS — Z9221 Personal history of antineoplastic chemotherapy: Secondary | ICD-10-CM | POA: Diagnosis not present

## 2020-02-02 DIAGNOSIS — Z7901 Long term (current) use of anticoagulants: Secondary | ICD-10-CM | POA: Diagnosis not present

## 2020-02-02 DIAGNOSIS — F4323 Adjustment disorder with mixed anxiety and depressed mood: Secondary | ICD-10-CM | POA: Diagnosis not present

## 2020-02-02 DIAGNOSIS — D2261 Melanocytic nevi of right upper limb, including shoulder: Secondary | ICD-10-CM | POA: Diagnosis not present

## 2020-02-02 DIAGNOSIS — Z1211 Encounter for screening for malignant neoplasm of colon: Secondary | ICD-10-CM | POA: Diagnosis not present

## 2020-02-02 DIAGNOSIS — E785 Hyperlipidemia, unspecified: Secondary | ICD-10-CM | POA: Diagnosis not present

## 2020-02-02 DIAGNOSIS — R072 Precordial pain: Secondary | ICD-10-CM | POA: Diagnosis not present

## 2020-02-02 DIAGNOSIS — Z681 Body mass index (BMI) 19 or less, adult: Secondary | ICD-10-CM | POA: Diagnosis not present

## 2020-02-02 DIAGNOSIS — R7881 Bacteremia: Secondary | ICD-10-CM | POA: Diagnosis not present

## 2020-02-02 DIAGNOSIS — C189 Malignant neoplasm of colon, unspecified: Secondary | ICD-10-CM | POA: Diagnosis not present

## 2020-02-02 DIAGNOSIS — M8580 Other specified disorders of bone density and structure, unspecified site: Secondary | ICD-10-CM | POA: Diagnosis not present

## 2020-02-02 DIAGNOSIS — L03116 Cellulitis of left lower limb: Secondary | ICD-10-CM | POA: Diagnosis not present

## 2020-02-02 DIAGNOSIS — S72141D Displaced intertrochanteric fracture of right femur, subsequent encounter for closed fracture with routine healing: Secondary | ICD-10-CM | POA: Diagnosis not present

## 2020-02-02 DIAGNOSIS — G720 Drug-induced myopathy: Secondary | ICD-10-CM | POA: Diagnosis not present

## 2020-02-02 DIAGNOSIS — H8112 Benign paroxysmal vertigo, left ear: Secondary | ICD-10-CM | POA: Diagnosis not present

## 2020-02-02 DIAGNOSIS — I4892 Unspecified atrial flutter: Secondary | ICD-10-CM | POA: Diagnosis not present

## 2020-02-02 DIAGNOSIS — F3341 Major depressive disorder, recurrent, in partial remission: Secondary | ICD-10-CM | POA: Diagnosis not present

## 2020-02-02 DIAGNOSIS — D508 Other iron deficiency anemias: Secondary | ICD-10-CM | POA: Diagnosis not present

## 2020-02-02 DIAGNOSIS — E1169 Type 2 diabetes mellitus with other specified complication: Secondary | ICD-10-CM | POA: Diagnosis not present

## 2020-02-02 DIAGNOSIS — Z01812 Encounter for preprocedural laboratory examination: Secondary | ICD-10-CM | POA: Diagnosis not present

## 2020-02-02 DIAGNOSIS — M25651 Stiffness of right hip, not elsewhere classified: Secondary | ICD-10-CM | POA: Diagnosis not present

## 2020-02-02 DIAGNOSIS — K760 Fatty (change of) liver, not elsewhere classified: Secondary | ICD-10-CM | POA: Diagnosis not present

## 2020-02-02 DIAGNOSIS — C4442 Squamous cell carcinoma of skin of scalp and neck: Secondary | ICD-10-CM | POA: Diagnosis not present

## 2020-02-02 DIAGNOSIS — L02416 Cutaneous abscess of left lower limb: Secondary | ICD-10-CM | POA: Diagnosis not present

## 2020-02-02 DIAGNOSIS — M6289 Other specified disorders of muscle: Secondary | ICD-10-CM | POA: Diagnosis not present

## 2020-02-02 DIAGNOSIS — M25661 Stiffness of right knee, not elsewhere classified: Secondary | ICD-10-CM | POA: Diagnosis not present

## 2020-02-02 DIAGNOSIS — E782 Mixed hyperlipidemia: Secondary | ICD-10-CM | POA: Diagnosis not present

## 2020-02-02 DIAGNOSIS — M25552 Pain in left hip: Secondary | ICD-10-CM | POA: Diagnosis not present

## 2020-02-02 DIAGNOSIS — R569 Unspecified convulsions: Secondary | ICD-10-CM | POA: Diagnosis not present

## 2020-02-02 DIAGNOSIS — F3131 Bipolar disorder, current episode depressed, mild: Secondary | ICD-10-CM | POA: Diagnosis not present

## 2020-02-02 DIAGNOSIS — S60455A Superficial foreign body of left ring finger, initial encounter: Secondary | ICD-10-CM | POA: Diagnosis not present

## 2020-02-02 DIAGNOSIS — H6983 Other specified disorders of Eustachian tube, bilateral: Secondary | ICD-10-CM | POA: Diagnosis not present

## 2020-02-02 DIAGNOSIS — Z8042 Family history of malignant neoplasm of prostate: Secondary | ICD-10-CM | POA: Diagnosis not present

## 2020-02-02 DIAGNOSIS — Z9011 Acquired absence of right breast and nipple: Secondary | ICD-10-CM | POA: Diagnosis not present

## 2020-02-02 DIAGNOSIS — Z743 Need for continuous supervision: Secondary | ICD-10-CM | POA: Diagnosis not present

## 2020-02-02 DIAGNOSIS — F1190 Opioid use, unspecified, uncomplicated: Secondary | ICD-10-CM | POA: Diagnosis not present

## 2020-02-02 DIAGNOSIS — R972 Elevated prostate specific antigen [PSA]: Secondary | ICD-10-CM | POA: Diagnosis not present

## 2020-02-02 DIAGNOSIS — M2550 Pain in unspecified joint: Secondary | ICD-10-CM | POA: Diagnosis not present

## 2020-02-02 DIAGNOSIS — K5900 Constipation, unspecified: Secondary | ICD-10-CM | POA: Diagnosis not present

## 2020-02-02 DIAGNOSIS — G47419 Narcolepsy without cataplexy: Secondary | ICD-10-CM | POA: Diagnosis not present

## 2020-02-02 DIAGNOSIS — D126 Benign neoplasm of colon, unspecified: Secondary | ICD-10-CM | POA: Diagnosis not present

## 2020-02-02 DIAGNOSIS — L578 Other skin changes due to chronic exposure to nonionizing radiation: Secondary | ICD-10-CM | POA: Diagnosis not present

## 2020-02-02 DIAGNOSIS — I4819 Other persistent atrial fibrillation: Secondary | ICD-10-CM | POA: Diagnosis not present

## 2020-02-02 DIAGNOSIS — I351 Nonrheumatic aortic (valve) insufficiency: Secondary | ICD-10-CM | POA: Diagnosis not present

## 2020-02-02 DIAGNOSIS — L509 Urticaria, unspecified: Secondary | ICD-10-CM | POA: Diagnosis not present

## 2020-02-02 DIAGNOSIS — G459 Transient cerebral ischemic attack, unspecified: Secondary | ICD-10-CM | POA: Diagnosis not present

## 2020-02-02 DIAGNOSIS — N3001 Acute cystitis with hematuria: Secondary | ICD-10-CM | POA: Diagnosis not present

## 2020-02-02 DIAGNOSIS — G4730 Sleep apnea, unspecified: Secondary | ICD-10-CM | POA: Diagnosis not present

## 2020-02-02 DIAGNOSIS — M62552 Muscle wasting and atrophy, not elsewhere classified, left thigh: Secondary | ICD-10-CM | POA: Diagnosis not present

## 2020-02-02 DIAGNOSIS — J9601 Acute respiratory failure with hypoxia: Secondary | ICD-10-CM | POA: Diagnosis not present

## 2020-02-02 DIAGNOSIS — D801 Nonfamilial hypogammaglobulinemia: Secondary | ICD-10-CM | POA: Diagnosis not present

## 2020-02-02 DIAGNOSIS — S43491D Other sprain of right shoulder joint, subsequent encounter: Secondary | ICD-10-CM | POA: Diagnosis not present

## 2020-02-02 DIAGNOSIS — C946 Myelodysplastic disease, not classified: Secondary | ICD-10-CM | POA: Diagnosis not present

## 2020-02-02 DIAGNOSIS — R21 Rash and other nonspecific skin eruption: Secondary | ICD-10-CM | POA: Diagnosis not present

## 2020-02-02 DIAGNOSIS — S72011A Unspecified intracapsular fracture of right femur, initial encounter for closed fracture: Secondary | ICD-10-CM | POA: Diagnosis not present

## 2020-02-02 DIAGNOSIS — R1011 Right upper quadrant pain: Secondary | ICD-10-CM | POA: Diagnosis not present

## 2020-02-02 DIAGNOSIS — Z856 Personal history of leukemia: Secondary | ICD-10-CM | POA: Diagnosis not present

## 2020-02-02 DIAGNOSIS — Z791 Long term (current) use of non-steroidal anti-inflammatories (NSAID): Secondary | ICD-10-CM | POA: Diagnosis not present

## 2020-02-02 DIAGNOSIS — Z0000 Encounter for general adult medical examination without abnormal findings: Secondary | ICD-10-CM | POA: Diagnosis not present

## 2020-02-02 DIAGNOSIS — Z8349 Family history of other endocrine, nutritional and metabolic diseases: Secondary | ICD-10-CM | POA: Diagnosis not present

## 2020-02-02 DIAGNOSIS — Z825 Family history of asthma and other chronic lower respiratory diseases: Secondary | ICD-10-CM | POA: Diagnosis not present

## 2020-02-02 DIAGNOSIS — G309 Alzheimer's disease, unspecified: Secondary | ICD-10-CM | POA: Diagnosis not present

## 2020-02-02 DIAGNOSIS — Z419 Encounter for procedure for purposes other than remedying health state, unspecified: Secondary | ICD-10-CM | POA: Diagnosis not present

## 2020-02-02 DIAGNOSIS — I719 Aortic aneurysm of unspecified site, without rupture: Secondary | ICD-10-CM | POA: Diagnosis not present

## 2020-02-02 DIAGNOSIS — Z87891 Personal history of nicotine dependence: Secondary | ICD-10-CM | POA: Diagnosis not present

## 2020-02-02 DIAGNOSIS — H43813 Vitreous degeneration, bilateral: Secondary | ICD-10-CM | POA: Diagnosis not present

## 2020-02-02 DIAGNOSIS — H9313 Tinnitus, bilateral: Secondary | ICD-10-CM | POA: Diagnosis not present

## 2020-02-02 DIAGNOSIS — I700 Atherosclerosis of aorta: Secondary | ICD-10-CM | POA: Diagnosis not present

## 2020-02-02 DIAGNOSIS — D649 Anemia, unspecified: Secondary | ICD-10-CM | POA: Diagnosis not present

## 2020-02-02 DIAGNOSIS — Z0001 Encounter for general adult medical examination with abnormal findings: Secondary | ICD-10-CM | POA: Diagnosis not present

## 2020-02-02 DIAGNOSIS — R600 Localized edema: Secondary | ICD-10-CM | POA: Diagnosis not present

## 2020-02-02 DIAGNOSIS — E1121 Type 2 diabetes mellitus with diabetic nephropathy: Secondary | ICD-10-CM | POA: Diagnosis not present

## 2020-02-02 DIAGNOSIS — M170 Bilateral primary osteoarthritis of knee: Secondary | ICD-10-CM | POA: Diagnosis not present

## 2020-02-02 DIAGNOSIS — R1013 Epigastric pain: Secondary | ICD-10-CM | POA: Diagnosis not present

## 2020-02-02 DIAGNOSIS — C9000 Multiple myeloma not having achieved remission: Secondary | ICD-10-CM | POA: Diagnosis not present

## 2020-02-02 DIAGNOSIS — J8410 Pulmonary fibrosis, unspecified: Secondary | ICD-10-CM | POA: Diagnosis not present

## 2020-02-02 DIAGNOSIS — M79661 Pain in right lower leg: Secondary | ICD-10-CM | POA: Diagnosis not present

## 2020-02-02 DIAGNOSIS — G250 Essential tremor: Secondary | ICD-10-CM | POA: Diagnosis not present

## 2020-02-02 DIAGNOSIS — R200 Anesthesia of skin: Secondary | ICD-10-CM | POA: Diagnosis not present

## 2020-02-02 DIAGNOSIS — H02831 Dermatochalasis of right upper eyelid: Secondary | ICD-10-CM | POA: Diagnosis not present

## 2020-02-02 DIAGNOSIS — R011 Cardiac murmur, unspecified: Secondary | ICD-10-CM | POA: Diagnosis not present

## 2020-02-02 DIAGNOSIS — M48061 Spinal stenosis, lumbar region without neurogenic claudication: Secondary | ICD-10-CM | POA: Diagnosis not present

## 2020-02-02 DIAGNOSIS — M9903 Segmental and somatic dysfunction of lumbar region: Secondary | ICD-10-CM | POA: Diagnosis not present

## 2020-02-02 DIAGNOSIS — F331 Major depressive disorder, recurrent, moderate: Secondary | ICD-10-CM | POA: Diagnosis not present

## 2020-02-02 DIAGNOSIS — B379 Candidiasis, unspecified: Secondary | ICD-10-CM | POA: Diagnosis not present

## 2020-02-02 DIAGNOSIS — R2681 Unsteadiness on feet: Secondary | ICD-10-CM | POA: Diagnosis not present

## 2020-02-02 DIAGNOSIS — D6489 Other specified anemias: Secondary | ICD-10-CM | POA: Diagnosis not present

## 2020-02-02 DIAGNOSIS — M8008XD Age-related osteoporosis with current pathological fracture, vertebra(e), subsequent encounter for fracture with routine healing: Secondary | ICD-10-CM | POA: Diagnosis not present

## 2020-02-02 DIAGNOSIS — R7301 Impaired fasting glucose: Secondary | ICD-10-CM | POA: Diagnosis not present

## 2020-02-02 DIAGNOSIS — I110 Hypertensive heart disease with heart failure: Secondary | ICD-10-CM | POA: Diagnosis not present

## 2020-02-02 DIAGNOSIS — M069 Rheumatoid arthritis, unspecified: Secondary | ICD-10-CM | POA: Diagnosis not present

## 2020-02-02 DIAGNOSIS — M109 Gout, unspecified: Secondary | ICD-10-CM | POA: Diagnosis not present

## 2020-02-02 DIAGNOSIS — I350 Nonrheumatic aortic (valve) stenosis: Secondary | ICD-10-CM | POA: Diagnosis not present

## 2020-02-02 DIAGNOSIS — Z818 Family history of other mental and behavioral disorders: Secondary | ICD-10-CM | POA: Diagnosis not present

## 2020-02-02 DIAGNOSIS — C50919 Malignant neoplasm of unspecified site of unspecified female breast: Secondary | ICD-10-CM | POA: Diagnosis not present

## 2020-02-02 DIAGNOSIS — Z79899 Other long term (current) drug therapy: Secondary | ICD-10-CM | POA: Diagnosis not present

## 2020-02-02 DIAGNOSIS — M75112 Incomplete rotator cuff tear or rupture of left shoulder, not specified as traumatic: Secondary | ICD-10-CM | POA: Diagnosis not present

## 2020-02-02 DIAGNOSIS — Z96653 Presence of artificial knee joint, bilateral: Secondary | ICD-10-CM | POA: Diagnosis not present

## 2020-02-02 DIAGNOSIS — I10 Essential (primary) hypertension: Secondary | ICD-10-CM | POA: Diagnosis not present

## 2020-02-02 DIAGNOSIS — N3000 Acute cystitis without hematuria: Secondary | ICD-10-CM | POA: Diagnosis not present

## 2020-02-02 DIAGNOSIS — G479 Sleep disorder, unspecified: Secondary | ICD-10-CM | POA: Diagnosis not present

## 2020-02-02 DIAGNOSIS — Z923 Personal history of irradiation: Secondary | ICD-10-CM | POA: Diagnosis not present

## 2020-02-02 DIAGNOSIS — M19011 Primary osteoarthritis, right shoulder: Secondary | ICD-10-CM | POA: Diagnosis not present

## 2020-02-02 DIAGNOSIS — M25512 Pain in left shoulder: Secondary | ICD-10-CM | POA: Diagnosis not present

## 2020-02-02 DIAGNOSIS — M75111 Incomplete rotator cuff tear or rupture of right shoulder, not specified as traumatic: Secondary | ICD-10-CM | POA: Diagnosis not present

## 2020-02-02 DIAGNOSIS — E1136 Type 2 diabetes mellitus with diabetic cataract: Secondary | ICD-10-CM | POA: Diagnosis not present

## 2020-02-02 DIAGNOSIS — F411 Generalized anxiety disorder: Secondary | ICD-10-CM | POA: Diagnosis not present

## 2020-02-02 DIAGNOSIS — R7303 Prediabetes: Secondary | ICD-10-CM | POA: Diagnosis not present

## 2020-02-02 DIAGNOSIS — L439 Lichen planus, unspecified: Secondary | ICD-10-CM | POA: Diagnosis not present

## 2020-02-02 DIAGNOSIS — E876 Hypokalemia: Secondary | ICD-10-CM | POA: Diagnosis not present

## 2020-02-02 DIAGNOSIS — H02834 Dermatochalasis of left upper eyelid: Secondary | ICD-10-CM | POA: Diagnosis not present

## 2020-02-02 DIAGNOSIS — M65341 Trigger finger, right ring finger: Secondary | ICD-10-CM | POA: Diagnosis not present

## 2020-02-02 DIAGNOSIS — J9383 Other pneumothorax: Secondary | ICD-10-CM | POA: Diagnosis not present

## 2020-02-02 DIAGNOSIS — R5383 Other fatigue: Secondary | ICD-10-CM | POA: Diagnosis not present

## 2020-02-02 DIAGNOSIS — Z20822 Contact with and (suspected) exposure to covid-19: Secondary | ICD-10-CM | POA: Diagnosis not present

## 2020-02-02 DIAGNOSIS — G894 Chronic pain syndrome: Secondary | ICD-10-CM | POA: Diagnosis not present

## 2020-02-02 DIAGNOSIS — Z452 Encounter for adjustment and management of vascular access device: Secondary | ICD-10-CM | POA: Diagnosis not present

## 2020-02-02 DIAGNOSIS — J9621 Acute and chronic respiratory failure with hypoxia: Secondary | ICD-10-CM | POA: Diagnosis not present

## 2020-02-02 DIAGNOSIS — Z9013 Acquired absence of bilateral breasts and nipples: Secondary | ICD-10-CM | POA: Diagnosis not present

## 2020-02-02 DIAGNOSIS — I2699 Other pulmonary embolism without acute cor pulmonale: Secondary | ICD-10-CM | POA: Diagnosis not present

## 2020-02-02 DIAGNOSIS — Z20818 Contact with and (suspected) exposure to other bacterial communicable diseases: Secondary | ICD-10-CM | POA: Diagnosis not present

## 2020-02-02 DIAGNOSIS — H539 Unspecified visual disturbance: Secondary | ICD-10-CM | POA: Diagnosis not present

## 2020-02-02 DIAGNOSIS — R899 Unspecified abnormal finding in specimens from other organs, systems and tissues: Secondary | ICD-10-CM | POA: Diagnosis not present

## 2020-02-02 DIAGNOSIS — M7661 Achilles tendinitis, right leg: Secondary | ICD-10-CM | POA: Diagnosis not present

## 2020-02-02 DIAGNOSIS — F3181 Bipolar II disorder: Secondary | ICD-10-CM | POA: Diagnosis not present

## 2020-02-02 DIAGNOSIS — I472 Ventricular tachycardia: Secondary | ICD-10-CM | POA: Diagnosis not present

## 2020-02-02 DIAGNOSIS — E039 Hypothyroidism, unspecified: Secondary | ICD-10-CM | POA: Diagnosis not present

## 2020-02-02 DIAGNOSIS — D696 Thrombocytopenia, unspecified: Secondary | ICD-10-CM | POA: Diagnosis not present

## 2020-02-02 DIAGNOSIS — N1831 Chronic kidney disease, stage 3a: Secondary | ICD-10-CM | POA: Diagnosis not present

## 2020-02-02 DIAGNOSIS — R2689 Other abnormalities of gait and mobility: Secondary | ICD-10-CM | POA: Diagnosis not present

## 2020-02-02 DIAGNOSIS — U071 COVID-19: Secondary | ICD-10-CM | POA: Diagnosis not present

## 2020-02-02 DIAGNOSIS — H8190 Unspecified disorder of vestibular function, unspecified ear: Secondary | ICD-10-CM | POA: Diagnosis not present

## 2020-02-02 DIAGNOSIS — K50818 Crohn's disease of both small and large intestine with other complication: Secondary | ICD-10-CM | POA: Diagnosis not present

## 2020-02-02 DIAGNOSIS — G40909 Epilepsy, unspecified, not intractable, without status epilepticus: Secondary | ICD-10-CM | POA: Diagnosis not present

## 2020-02-02 DIAGNOSIS — C8338 Diffuse large B-cell lymphoma, lymph nodes of multiple sites: Secondary | ICD-10-CM | POA: Diagnosis not present

## 2020-02-02 DIAGNOSIS — M79652 Pain in left thigh: Secondary | ICD-10-CM | POA: Diagnosis not present

## 2020-02-02 DIAGNOSIS — M47817 Spondylosis without myelopathy or radiculopathy, lumbosacral region: Secondary | ICD-10-CM | POA: Diagnosis not present

## 2020-02-02 DIAGNOSIS — Z23 Encounter for immunization: Secondary | ICD-10-CM | POA: Diagnosis not present

## 2020-02-02 DIAGNOSIS — M4726 Other spondylosis with radiculopathy, lumbar region: Secondary | ICD-10-CM | POA: Diagnosis not present

## 2020-02-02 DIAGNOSIS — I4891 Unspecified atrial fibrillation: Secondary | ICD-10-CM | POA: Diagnosis not present

## 2020-02-02 DIAGNOSIS — C7951 Secondary malignant neoplasm of bone: Secondary | ICD-10-CM | POA: Diagnosis not present

## 2020-02-02 DIAGNOSIS — R5381 Other malaise: Secondary | ICD-10-CM | POA: Diagnosis not present

## 2020-02-02 DIAGNOSIS — N952 Postmenopausal atrophic vaginitis: Secondary | ICD-10-CM | POA: Diagnosis not present

## 2020-02-02 DIAGNOSIS — R945 Abnormal results of liver function studies: Secondary | ICD-10-CM | POA: Diagnosis not present

## 2020-02-02 DIAGNOSIS — Z713 Dietary counseling and surveillance: Secondary | ICD-10-CM | POA: Diagnosis not present

## 2020-02-02 DIAGNOSIS — Z1331 Encounter for screening for depression: Secondary | ICD-10-CM | POA: Diagnosis not present

## 2020-02-02 DIAGNOSIS — D1801 Hemangioma of skin and subcutaneous tissue: Secondary | ICD-10-CM | POA: Diagnosis not present

## 2020-02-02 DIAGNOSIS — I252 Old myocardial infarction: Secondary | ICD-10-CM | POA: Diagnosis not present

## 2020-02-02 DIAGNOSIS — C61 Malignant neoplasm of prostate: Secondary | ICD-10-CM | POA: Diagnosis not present

## 2020-02-02 DIAGNOSIS — Z85828 Personal history of other malignant neoplasm of skin: Secondary | ICD-10-CM | POA: Diagnosis not present

## 2020-02-02 DIAGNOSIS — Z888 Allergy status to other drugs, medicaments and biological substances status: Secondary | ICD-10-CM | POA: Diagnosis not present

## 2020-02-02 DIAGNOSIS — N816 Rectocele: Secondary | ICD-10-CM | POA: Diagnosis not present

## 2020-02-02 DIAGNOSIS — I340 Nonrheumatic mitral (valve) insufficiency: Secondary | ICD-10-CM | POA: Diagnosis not present

## 2020-02-02 DIAGNOSIS — C50412 Malignant neoplasm of upper-outer quadrant of left female breast: Secondary | ICD-10-CM | POA: Diagnosis not present

## 2020-02-02 DIAGNOSIS — R0602 Shortness of breath: Secondary | ICD-10-CM | POA: Diagnosis not present

## 2020-02-02 DIAGNOSIS — I209 Angina pectoris, unspecified: Secondary | ICD-10-CM | POA: Diagnosis not present

## 2020-02-02 DIAGNOSIS — I25119 Atherosclerotic heart disease of native coronary artery with unspecified angina pectoris: Secondary | ICD-10-CM | POA: Diagnosis not present

## 2020-02-02 DIAGNOSIS — Z800 Family history of malignant neoplasm of digestive organs: Secondary | ICD-10-CM | POA: Diagnosis not present

## 2020-02-02 DIAGNOSIS — K635 Polyp of colon: Secondary | ICD-10-CM | POA: Diagnosis not present

## 2020-02-02 DIAGNOSIS — R0689 Other abnormalities of breathing: Secondary | ICD-10-CM | POA: Diagnosis not present

## 2020-02-02 DIAGNOSIS — E139 Other specified diabetes mellitus without complications: Secondary | ICD-10-CM | POA: Diagnosis not present

## 2020-02-02 DIAGNOSIS — E890 Postprocedural hypothyroidism: Secondary | ICD-10-CM | POA: Diagnosis not present

## 2020-02-02 DIAGNOSIS — H9190 Unspecified hearing loss, unspecified ear: Secondary | ICD-10-CM | POA: Diagnosis not present

## 2020-02-02 DIAGNOSIS — L814 Other melanin hyperpigmentation: Secondary | ICD-10-CM | POA: Diagnosis not present

## 2020-02-02 DIAGNOSIS — R451 Restlessness and agitation: Secondary | ICD-10-CM | POA: Diagnosis not present

## 2020-02-02 DIAGNOSIS — N400 Enlarged prostate without lower urinary tract symptoms: Secondary | ICD-10-CM | POA: Diagnosis not present

## 2020-02-02 DIAGNOSIS — E113313 Type 2 diabetes mellitus with moderate nonproliferative diabetic retinopathy with macular edema, bilateral: Secondary | ICD-10-CM | POA: Diagnosis not present

## 2020-02-02 DIAGNOSIS — J45909 Unspecified asthma, uncomplicated: Secondary | ICD-10-CM | POA: Diagnosis not present

## 2020-02-02 DIAGNOSIS — G4733 Obstructive sleep apnea (adult) (pediatric): Secondary | ICD-10-CM | POA: Diagnosis not present

## 2020-02-02 DIAGNOSIS — M797 Fibromyalgia: Secondary | ICD-10-CM | POA: Diagnosis not present

## 2020-02-02 DIAGNOSIS — E041 Nontoxic single thyroid nodule: Secondary | ICD-10-CM | POA: Diagnosis not present

## 2020-02-02 DIAGNOSIS — D500 Iron deficiency anemia secondary to blood loss (chronic): Secondary | ICD-10-CM | POA: Diagnosis not present

## 2020-02-02 DIAGNOSIS — R351 Nocturia: Secondary | ICD-10-CM | POA: Diagnosis not present

## 2020-02-02 DIAGNOSIS — G2581 Restless legs syndrome: Secondary | ICD-10-CM | POA: Diagnosis not present

## 2020-02-02 DIAGNOSIS — R279 Unspecified lack of coordination: Secondary | ICD-10-CM | POA: Diagnosis not present

## 2020-02-02 DIAGNOSIS — Z91048 Other nonmedicinal substance allergy status: Secondary | ICD-10-CM | POA: Diagnosis not present

## 2020-02-02 DIAGNOSIS — A419 Sepsis, unspecified organism: Secondary | ICD-10-CM | POA: Diagnosis not present

## 2020-02-02 DIAGNOSIS — Z299 Encounter for prophylactic measures, unspecified: Secondary | ICD-10-CM | POA: Diagnosis not present

## 2020-02-02 DIAGNOSIS — M62551 Muscle wasting and atrophy, not elsewhere classified, right thigh: Secondary | ICD-10-CM | POA: Diagnosis not present

## 2020-02-02 DIAGNOSIS — N401 Enlarged prostate with lower urinary tract symptoms: Secondary | ICD-10-CM | POA: Diagnosis not present

## 2020-02-02 DIAGNOSIS — E1142 Type 2 diabetes mellitus with diabetic polyneuropathy: Secondary | ICD-10-CM | POA: Diagnosis not present

## 2020-02-02 DIAGNOSIS — M4800 Spinal stenosis, site unspecified: Secondary | ICD-10-CM | POA: Diagnosis not present

## 2020-02-02 DIAGNOSIS — L821 Other seborrheic keratosis: Secondary | ICD-10-CM | POA: Diagnosis not present

## 2020-02-02 DIAGNOSIS — F5101 Primary insomnia: Secondary | ICD-10-CM | POA: Diagnosis not present

## 2020-02-02 DIAGNOSIS — R208 Other disturbances of skin sensation: Secondary | ICD-10-CM | POA: Diagnosis not present

## 2020-02-02 DIAGNOSIS — W57XXXA Bitten or stung by nonvenomous insect and other nonvenomous arthropods, initial encounter: Secondary | ICD-10-CM | POA: Diagnosis not present

## 2020-02-02 DIAGNOSIS — Z1212 Encounter for screening for malignant neoplasm of rectum: Secondary | ICD-10-CM | POA: Diagnosis not present

## 2020-02-02 DIAGNOSIS — G5603 Carpal tunnel syndrome, bilateral upper limbs: Secondary | ICD-10-CM | POA: Diagnosis not present

## 2020-02-02 DIAGNOSIS — I5022 Chronic systolic (congestive) heart failure: Secondary | ICD-10-CM | POA: Diagnosis not present

## 2020-02-02 DIAGNOSIS — M48062 Spinal stenosis, lumbar region with neurogenic claudication: Secondary | ICD-10-CM | POA: Diagnosis not present

## 2020-02-02 DIAGNOSIS — J4520 Mild intermittent asthma, uncomplicated: Secondary | ICD-10-CM | POA: Diagnosis not present

## 2020-02-02 DIAGNOSIS — Z602 Problems related to living alone: Secondary | ICD-10-CM | POA: Diagnosis not present

## 2020-02-02 DIAGNOSIS — E7800 Pure hypercholesterolemia, unspecified: Secondary | ICD-10-CM | POA: Diagnosis not present

## 2020-02-02 DIAGNOSIS — D6851 Activated protein C resistance: Secondary | ICD-10-CM | POA: Diagnosis not present

## 2020-02-02 DIAGNOSIS — H409 Unspecified glaucoma: Secondary | ICD-10-CM | POA: Diagnosis not present

## 2020-02-02 DIAGNOSIS — M9905 Segmental and somatic dysfunction of pelvic region: Secondary | ICD-10-CM | POA: Diagnosis not present

## 2020-02-02 DIAGNOSIS — H35033 Hypertensive retinopathy, bilateral: Secondary | ICD-10-CM | POA: Diagnosis not present

## 2020-02-02 DIAGNOSIS — I951 Orthostatic hypotension: Secondary | ICD-10-CM | POA: Diagnosis not present

## 2020-02-02 DIAGNOSIS — I130 Hypertensive heart and chronic kidney disease with heart failure and stage 1 through stage 4 chronic kidney disease, or unspecified chronic kidney disease: Secondary | ICD-10-CM | POA: Diagnosis not present

## 2020-02-02 DIAGNOSIS — T8452XA Infection and inflammatory reaction due to internal left hip prosthesis, initial encounter: Secondary | ICD-10-CM | POA: Diagnosis not present

## 2020-02-02 DIAGNOSIS — J441 Chronic obstructive pulmonary disease with (acute) exacerbation: Secondary | ICD-10-CM | POA: Diagnosis not present

## 2020-02-02 DIAGNOSIS — I959 Hypotension, unspecified: Secondary | ICD-10-CM | POA: Diagnosis not present

## 2020-02-02 DIAGNOSIS — J302 Other seasonal allergic rhinitis: Secondary | ICD-10-CM | POA: Diagnosis not present

## 2020-02-02 DIAGNOSIS — R112 Nausea with vomiting, unspecified: Secondary | ICD-10-CM | POA: Diagnosis not present

## 2020-02-02 DIAGNOSIS — T82898D Other specified complication of vascular prosthetic devices, implants and grafts, subsequent encounter: Secondary | ICD-10-CM | POA: Diagnosis not present

## 2020-02-02 DIAGNOSIS — M25662 Stiffness of left knee, not elsewhere classified: Secondary | ICD-10-CM | POA: Diagnosis not present

## 2020-02-02 DIAGNOSIS — Z881 Allergy status to other antibiotic agents status: Secondary | ICD-10-CM | POA: Diagnosis not present

## 2020-02-02 DIAGNOSIS — I739 Peripheral vascular disease, unspecified: Secondary | ICD-10-CM | POA: Diagnosis not present

## 2020-02-02 DIAGNOSIS — I248 Other forms of acute ischemic heart disease: Secondary | ICD-10-CM | POA: Diagnosis not present

## 2020-02-02 DIAGNOSIS — Z6841 Body Mass Index (BMI) 40.0 and over, adult: Secondary | ICD-10-CM | POA: Diagnosis not present

## 2020-02-02 DIAGNOSIS — Z1159 Encounter for screening for other viral diseases: Secondary | ICD-10-CM | POA: Diagnosis not present

## 2020-02-02 DIAGNOSIS — E860 Dehydration: Secondary | ICD-10-CM | POA: Diagnosis not present

## 2020-02-02 DIAGNOSIS — M542 Cervicalgia: Secondary | ICD-10-CM | POA: Diagnosis not present

## 2020-02-02 DIAGNOSIS — S72001A Fracture of unspecified part of neck of right femur, initial encounter for closed fracture: Secondary | ICD-10-CM | POA: Diagnosis not present

## 2020-02-02 DIAGNOSIS — M4802 Spinal stenosis, cervical region: Secondary | ICD-10-CM | POA: Diagnosis not present

## 2020-02-02 DIAGNOSIS — L909 Atrophic disorder of skin, unspecified: Secondary | ICD-10-CM | POA: Diagnosis not present

## 2020-02-02 DIAGNOSIS — D0462 Carcinoma in situ of skin of left upper limb, including shoulder: Secondary | ICD-10-CM | POA: Diagnosis not present

## 2020-02-02 DIAGNOSIS — N179 Acute kidney failure, unspecified: Secondary | ICD-10-CM | POA: Diagnosis not present

## 2020-02-02 DIAGNOSIS — F330 Major depressive disorder, recurrent, mild: Secondary | ICD-10-CM | POA: Diagnosis not present

## 2020-02-02 DIAGNOSIS — R079 Chest pain, unspecified: Secondary | ICD-10-CM | POA: Diagnosis not present

## 2020-02-02 DIAGNOSIS — L905 Scar conditions and fibrosis of skin: Secondary | ICD-10-CM | POA: Diagnosis not present

## 2020-02-02 DIAGNOSIS — D510 Vitamin B12 deficiency anemia due to intrinsic factor deficiency: Secondary | ICD-10-CM | POA: Diagnosis not present

## 2020-02-02 DIAGNOSIS — M1990 Unspecified osteoarthritis, unspecified site: Secondary | ICD-10-CM | POA: Diagnosis not present

## 2020-02-02 DIAGNOSIS — R0989 Other specified symptoms and signs involving the circulatory and respiratory systems: Secondary | ICD-10-CM | POA: Diagnosis not present

## 2020-02-02 DIAGNOSIS — M25532 Pain in left wrist: Secondary | ICD-10-CM | POA: Diagnosis not present

## 2020-02-02 DIAGNOSIS — I679 Cerebrovascular disease, unspecified: Secondary | ICD-10-CM | POA: Diagnosis not present

## 2020-02-02 DIAGNOSIS — Z20828 Contact with and (suspected) exposure to other viral communicable diseases: Secondary | ICD-10-CM | POA: Diagnosis not present

## 2020-02-02 DIAGNOSIS — G629 Polyneuropathy, unspecified: Secondary | ICD-10-CM | POA: Diagnosis not present

## 2020-02-02 DIAGNOSIS — J1282 Pneumonia due to coronavirus disease 2019: Secondary | ICD-10-CM | POA: Diagnosis not present

## 2020-02-02 DIAGNOSIS — M4316 Spondylolisthesis, lumbar region: Secondary | ICD-10-CM | POA: Diagnosis not present

## 2020-02-02 DIAGNOSIS — L820 Inflamed seborrheic keratosis: Secondary | ICD-10-CM | POA: Diagnosis not present

## 2020-02-02 DIAGNOSIS — M7671 Peroneal tendinitis, right leg: Secondary | ICD-10-CM | POA: Diagnosis not present

## 2020-02-02 DIAGNOSIS — K660 Peritoneal adhesions (postprocedural) (postinfection): Secondary | ICD-10-CM | POA: Diagnosis not present

## 2020-02-02 DIAGNOSIS — D225 Melanocytic nevi of trunk: Secondary | ICD-10-CM | POA: Diagnosis not present

## 2020-02-02 DIAGNOSIS — E1165 Type 2 diabetes mellitus with hyperglycemia: Secondary | ICD-10-CM | POA: Diagnosis not present

## 2020-02-02 DIAGNOSIS — Z7951 Long term (current) use of inhaled steroids: Secondary | ICD-10-CM | POA: Diagnosis not present

## 2020-02-02 DIAGNOSIS — J90 Pleural effusion, not elsewhere classified: Secondary | ICD-10-CM | POA: Diagnosis not present

## 2020-02-02 DIAGNOSIS — M25612 Stiffness of left shoulder, not elsewhere classified: Secondary | ICD-10-CM | POA: Diagnosis not present

## 2020-02-02 DIAGNOSIS — I471 Supraventricular tachycardia: Secondary | ICD-10-CM | POA: Diagnosis not present

## 2020-02-02 DIAGNOSIS — R519 Headache, unspecified: Secondary | ICD-10-CM | POA: Diagnosis not present

## 2020-02-02 DIAGNOSIS — E291 Testicular hypofunction: Secondary | ICD-10-CM | POA: Diagnosis not present

## 2020-02-02 DIAGNOSIS — C4492 Squamous cell carcinoma of skin, unspecified: Secondary | ICD-10-CM | POA: Diagnosis not present

## 2020-02-02 DIAGNOSIS — R739 Hyperglycemia, unspecified: Secondary | ICD-10-CM | POA: Diagnosis not present

## 2020-02-02 DIAGNOSIS — I2721 Secondary pulmonary arterial hypertension: Secondary | ICD-10-CM | POA: Diagnosis not present

## 2020-02-02 DIAGNOSIS — Z8701 Personal history of pneumonia (recurrent): Secondary | ICD-10-CM | POA: Diagnosis not present

## 2020-02-02 DIAGNOSIS — G8929 Other chronic pain: Secondary | ICD-10-CM | POA: Diagnosis not present

## 2020-02-02 DIAGNOSIS — C541 Malignant neoplasm of endometrium: Secondary | ICD-10-CM | POA: Diagnosis not present

## 2020-02-02 DIAGNOSIS — T466X5A Adverse effect of antihyperlipidemic and antiarteriosclerotic drugs, initial encounter: Secondary | ICD-10-CM | POA: Diagnosis not present

## 2020-02-02 DIAGNOSIS — I5030 Unspecified diastolic (congestive) heart failure: Secondary | ICD-10-CM | POA: Diagnosis not present

## 2020-02-02 DIAGNOSIS — R262 Difficulty in walking, not elsewhere classified: Secondary | ICD-10-CM | POA: Diagnosis not present

## 2020-02-02 DIAGNOSIS — F329 Major depressive disorder, single episode, unspecified: Secondary | ICD-10-CM | POA: Diagnosis not present

## 2020-02-02 DIAGNOSIS — Z96651 Presence of right artificial knee joint: Secondary | ICD-10-CM | POA: Diagnosis not present

## 2020-02-02 DIAGNOSIS — I6621 Occlusion and stenosis of right posterior cerebral artery: Secondary | ICD-10-CM | POA: Diagnosis not present

## 2020-02-02 DIAGNOSIS — R202 Paresthesia of skin: Secondary | ICD-10-CM | POA: Diagnosis not present

## 2020-02-02 DIAGNOSIS — R58 Hemorrhage, not elsewhere classified: Secondary | ICD-10-CM | POA: Diagnosis not present

## 2020-02-02 DIAGNOSIS — E875 Hyperkalemia: Secondary | ICD-10-CM | POA: Diagnosis not present

## 2020-02-02 DIAGNOSIS — D2239 Melanocytic nevi of other parts of face: Secondary | ICD-10-CM | POA: Diagnosis not present

## 2020-02-02 DIAGNOSIS — Z8719 Personal history of other diseases of the digestive system: Secondary | ICD-10-CM | POA: Diagnosis not present

## 2020-02-02 DIAGNOSIS — R1310 Dysphagia, unspecified: Secondary | ICD-10-CM | POA: Diagnosis not present

## 2020-02-02 DIAGNOSIS — Z90710 Acquired absence of both cervix and uterus: Secondary | ICD-10-CM | POA: Diagnosis not present

## 2020-02-02 DIAGNOSIS — F0150 Vascular dementia without behavioral disturbance: Secondary | ICD-10-CM | POA: Diagnosis not present

## 2020-02-02 DIAGNOSIS — Z471 Aftercare following joint replacement surgery: Secondary | ICD-10-CM | POA: Diagnosis not present

## 2020-02-02 DIAGNOSIS — J449 Chronic obstructive pulmonary disease, unspecified: Secondary | ICD-10-CM | POA: Diagnosis not present

## 2020-02-02 DIAGNOSIS — R531 Weakness: Secondary | ICD-10-CM | POA: Diagnosis not present

## 2020-02-02 DIAGNOSIS — Z96643 Presence of artificial hip joint, bilateral: Secondary | ICD-10-CM | POA: Diagnosis not present

## 2020-02-02 DIAGNOSIS — G4700 Insomnia, unspecified: Secondary | ICD-10-CM | POA: Diagnosis not present

## 2020-02-02 DIAGNOSIS — C44712 Basal cell carcinoma of skin of right lower limb, including hip: Secondary | ICD-10-CM | POA: Diagnosis not present

## 2020-02-02 DIAGNOSIS — M4807 Spinal stenosis, lumbosacral region: Secondary | ICD-10-CM | POA: Diagnosis not present

## 2020-02-02 DIAGNOSIS — M86171 Other acute osteomyelitis, right ankle and foot: Secondary | ICD-10-CM | POA: Diagnosis not present

## 2020-02-02 DIAGNOSIS — E119 Type 2 diabetes mellitus without complications: Secondary | ICD-10-CM | POA: Diagnosis not present

## 2020-02-02 DIAGNOSIS — I480 Paroxysmal atrial fibrillation: Secondary | ICD-10-CM | POA: Diagnosis not present

## 2020-02-02 DIAGNOSIS — Z808 Family history of malignant neoplasm of other organs or systems: Secondary | ICD-10-CM | POA: Diagnosis not present

## 2020-02-02 DIAGNOSIS — I5031 Acute diastolic (congestive) heart failure: Secondary | ICD-10-CM | POA: Diagnosis not present

## 2020-02-02 DIAGNOSIS — E669 Obesity, unspecified: Secondary | ICD-10-CM | POA: Diagnosis not present

## 2020-02-02 DIAGNOSIS — Z5112 Encounter for antineoplastic immunotherapy: Secondary | ICD-10-CM | POA: Diagnosis not present

## 2020-02-02 DIAGNOSIS — D485 Neoplasm of uncertain behavior of skin: Secondary | ICD-10-CM | POA: Diagnosis not present

## 2020-02-02 DIAGNOSIS — M25562 Pain in left knee: Secondary | ICD-10-CM | POA: Diagnosis not present

## 2020-02-02 DIAGNOSIS — S92331A Displaced fracture of third metatarsal bone, right foot, initial encounter for closed fracture: Secondary | ICD-10-CM | POA: Diagnosis not present

## 2020-02-02 DIAGNOSIS — R1311 Dysphagia, oral phase: Secondary | ICD-10-CM | POA: Diagnosis not present

## 2020-02-02 DIAGNOSIS — F17210 Nicotine dependence, cigarettes, uncomplicated: Secondary | ICD-10-CM | POA: Diagnosis not present

## 2020-02-02 DIAGNOSIS — K219 Gastro-esophageal reflux disease without esophagitis: Secondary | ICD-10-CM | POA: Diagnosis not present

## 2020-02-02 DIAGNOSIS — M7662 Achilles tendinitis, left leg: Secondary | ICD-10-CM | POA: Diagnosis not present

## 2020-02-02 DIAGNOSIS — R030 Elevated blood-pressure reading, without diagnosis of hypertension: Secondary | ICD-10-CM | POA: Diagnosis not present

## 2020-02-02 DIAGNOSIS — R001 Bradycardia, unspecified: Secondary | ICD-10-CM | POA: Diagnosis not present

## 2020-02-02 DIAGNOSIS — R29898 Other symptoms and signs involving the musculoskeletal system: Secondary | ICD-10-CM | POA: Diagnosis not present

## 2020-02-02 DIAGNOSIS — I5032 Chronic diastolic (congestive) heart failure: Secondary | ICD-10-CM | POA: Diagnosis not present

## 2020-02-02 DIAGNOSIS — M791 Myalgia, unspecified site: Secondary | ICD-10-CM | POA: Diagnosis not present

## 2020-02-02 DIAGNOSIS — K5730 Diverticulosis of large intestine without perforation or abscess without bleeding: Secondary | ICD-10-CM | POA: Diagnosis not present

## 2020-02-02 DIAGNOSIS — C50912 Malignant neoplasm of unspecified site of left female breast: Secondary | ICD-10-CM | POA: Diagnosis not present

## 2020-02-02 DIAGNOSIS — J986 Disorders of diaphragm: Secondary | ICD-10-CM | POA: Diagnosis not present

## 2020-02-02 DIAGNOSIS — I509 Heart failure, unspecified: Secondary | ICD-10-CM | POA: Diagnosis not present

## 2020-02-02 DIAGNOSIS — L02512 Cutaneous abscess of left hand: Secondary | ICD-10-CM | POA: Diagnosis not present

## 2020-02-02 DIAGNOSIS — L728 Other follicular cysts of the skin and subcutaneous tissue: Secondary | ICD-10-CM | POA: Diagnosis not present

## 2020-02-02 DIAGNOSIS — M25561 Pain in right knee: Secondary | ICD-10-CM | POA: Diagnosis not present

## 2020-02-02 DIAGNOSIS — R9431 Abnormal electrocardiogram [ECG] [EKG]: Secondary | ICD-10-CM | POA: Diagnosis not present

## 2020-02-02 DIAGNOSIS — R7989 Other specified abnormal findings of blood chemistry: Secondary | ICD-10-CM | POA: Diagnosis not present

## 2020-02-02 DIAGNOSIS — L570 Actinic keratosis: Secondary | ICD-10-CM | POA: Diagnosis not present

## 2020-02-02 DIAGNOSIS — J4530 Mild persistent asthma, uncomplicated: Secondary | ICD-10-CM | POA: Diagnosis not present

## 2020-02-02 DIAGNOSIS — H16223 Keratoconjunctivitis sicca, not specified as Sjogren's, bilateral: Secondary | ICD-10-CM | POA: Diagnosis not present

## 2020-02-03 DIAGNOSIS — C44319 Basal cell carcinoma of skin of other parts of face: Secondary | ICD-10-CM | POA: Diagnosis not present

## 2020-02-03 DIAGNOSIS — L821 Other seborrheic keratosis: Secondary | ICD-10-CM | POA: Diagnosis not present

## 2020-02-03 DIAGNOSIS — K5790 Diverticulosis of intestine, part unspecified, without perforation or abscess without bleeding: Secondary | ICD-10-CM | POA: Diagnosis not present

## 2020-02-03 DIAGNOSIS — F4321 Adjustment disorder with depressed mood: Secondary | ICD-10-CM | POA: Diagnosis not present

## 2020-02-03 DIAGNOSIS — L570 Actinic keratosis: Secondary | ICD-10-CM | POA: Diagnosis not present

## 2020-02-03 DIAGNOSIS — Q828 Other specified congenital malformations of skin: Secondary | ICD-10-CM | POA: Diagnosis not present

## 2020-02-03 DIAGNOSIS — G43909 Migraine, unspecified, not intractable, without status migrainosus: Secondary | ICD-10-CM | POA: Diagnosis not present

## 2020-02-03 DIAGNOSIS — H43813 Vitreous degeneration, bilateral: Secondary | ICD-10-CM | POA: Diagnosis not present

## 2020-02-03 DIAGNOSIS — M5489 Other dorsalgia: Secondary | ICD-10-CM | POA: Diagnosis not present

## 2020-02-03 DIAGNOSIS — R339 Retention of urine, unspecified: Secondary | ICD-10-CM | POA: Diagnosis not present

## 2020-02-03 DIAGNOSIS — M8088XD Other osteoporosis with current pathological fracture, vertebra(e), subsequent encounter for fracture with routine healing: Secondary | ICD-10-CM | POA: Diagnosis not present

## 2020-02-03 DIAGNOSIS — E559 Vitamin D deficiency, unspecified: Secondary | ICD-10-CM | POA: Diagnosis not present

## 2020-02-03 DIAGNOSIS — F17201 Nicotine dependence, unspecified, in remission: Secondary | ICD-10-CM | POA: Diagnosis not present

## 2020-02-03 DIAGNOSIS — Z85820 Personal history of malignant melanoma of skin: Secondary | ICD-10-CM | POA: Diagnosis not present

## 2020-02-03 DIAGNOSIS — I739 Peripheral vascular disease, unspecified: Secondary | ICD-10-CM | POA: Diagnosis not present

## 2020-02-03 DIAGNOSIS — J9601 Acute respiratory failure with hypoxia: Secondary | ICD-10-CM | POA: Diagnosis not present

## 2020-02-03 DIAGNOSIS — M1711 Unilateral primary osteoarthritis, right knee: Secondary | ICD-10-CM | POA: Diagnosis not present

## 2020-02-03 DIAGNOSIS — E1129 Type 2 diabetes mellitus with other diabetic kidney complication: Secondary | ICD-10-CM | POA: Diagnosis not present

## 2020-02-03 DIAGNOSIS — M79674 Pain in right toe(s): Secondary | ICD-10-CM | POA: Diagnosis not present

## 2020-02-03 DIAGNOSIS — I5043 Acute on chronic combined systolic (congestive) and diastolic (congestive) heart failure: Secondary | ICD-10-CM | POA: Diagnosis not present

## 2020-02-03 DIAGNOSIS — M25511 Pain in right shoulder: Secondary | ICD-10-CM | POA: Diagnosis not present

## 2020-02-03 DIAGNOSIS — R2681 Unsteadiness on feet: Secondary | ICD-10-CM | POA: Diagnosis not present

## 2020-02-03 DIAGNOSIS — E7849 Other hyperlipidemia: Secondary | ICD-10-CM | POA: Diagnosis not present

## 2020-02-03 DIAGNOSIS — D72819 Decreased white blood cell count, unspecified: Secondary | ICD-10-CM | POA: Diagnosis not present

## 2020-02-03 DIAGNOSIS — Z7189 Other specified counseling: Secondary | ICD-10-CM | POA: Diagnosis not present

## 2020-02-03 DIAGNOSIS — Z9889 Other specified postprocedural states: Secondary | ICD-10-CM | POA: Diagnosis not present

## 2020-02-03 DIAGNOSIS — E1165 Type 2 diabetes mellitus with hyperglycemia: Secondary | ICD-10-CM | POA: Diagnosis not present

## 2020-02-03 DIAGNOSIS — F17200 Nicotine dependence, unspecified, uncomplicated: Secondary | ICD-10-CM | POA: Diagnosis not present

## 2020-02-03 DIAGNOSIS — H35033 Hypertensive retinopathy, bilateral: Secondary | ICD-10-CM | POA: Diagnosis not present

## 2020-02-03 DIAGNOSIS — M47812 Spondylosis without myelopathy or radiculopathy, cervical region: Secondary | ICD-10-CM | POA: Diagnosis not present

## 2020-02-03 DIAGNOSIS — F329 Major depressive disorder, single episode, unspecified: Secondary | ICD-10-CM | POA: Diagnosis not present

## 2020-02-03 DIAGNOSIS — M48062 Spinal stenosis, lumbar region with neurogenic claudication: Secondary | ICD-10-CM | POA: Diagnosis not present

## 2020-02-03 DIAGNOSIS — N390 Urinary tract infection, site not specified: Secondary | ICD-10-CM | POA: Diagnosis not present

## 2020-02-03 DIAGNOSIS — M62552 Muscle wasting and atrophy, not elsewhere classified, left thigh: Secondary | ICD-10-CM | POA: Diagnosis not present

## 2020-02-03 DIAGNOSIS — I69318 Other symptoms and signs involving cognitive functions following cerebral infarction: Secondary | ICD-10-CM | POA: Diagnosis not present

## 2020-02-03 DIAGNOSIS — N184 Chronic kidney disease, stage 4 (severe): Secondary | ICD-10-CM | POA: Diagnosis not present

## 2020-02-03 DIAGNOSIS — N261 Atrophy of kidney (terminal): Secondary | ICD-10-CM | POA: Diagnosis not present

## 2020-02-03 DIAGNOSIS — F1021 Alcohol dependence, in remission: Secondary | ICD-10-CM | POA: Diagnosis not present

## 2020-02-03 DIAGNOSIS — G43001 Migraine without aura, not intractable, with status migrainosus: Secondary | ICD-10-CM | POA: Diagnosis not present

## 2020-02-03 DIAGNOSIS — Z5181 Encounter for therapeutic drug level monitoring: Secondary | ICD-10-CM | POA: Diagnosis not present

## 2020-02-03 DIAGNOSIS — Z23 Encounter for immunization: Secondary | ICD-10-CM | POA: Diagnosis not present

## 2020-02-03 DIAGNOSIS — F319 Bipolar disorder, unspecified: Secondary | ICD-10-CM | POA: Diagnosis not present

## 2020-02-03 DIAGNOSIS — Z791 Long term (current) use of non-steroidal anti-inflammatories (NSAID): Secondary | ICD-10-CM | POA: Diagnosis not present

## 2020-02-03 DIAGNOSIS — M7542 Impingement syndrome of left shoulder: Secondary | ICD-10-CM | POA: Diagnosis not present

## 2020-02-03 DIAGNOSIS — N189 Chronic kidney disease, unspecified: Secondary | ICD-10-CM | POA: Diagnosis not present

## 2020-02-03 DIAGNOSIS — S022XXA Fracture of nasal bones, initial encounter for closed fracture: Secondary | ICD-10-CM | POA: Diagnosis not present

## 2020-02-03 DIAGNOSIS — R570 Cardiogenic shock: Secondary | ICD-10-CM | POA: Diagnosis not present

## 2020-02-03 DIAGNOSIS — I444 Left anterior fascicular block: Secondary | ICD-10-CM | POA: Diagnosis not present

## 2020-02-03 DIAGNOSIS — Z7984 Long term (current) use of oral hypoglycemic drugs: Secondary | ICD-10-CM | POA: Diagnosis not present

## 2020-02-03 DIAGNOSIS — Z4901 Encounter for fitting and adjustment of extracorporeal dialysis catheter: Secondary | ICD-10-CM | POA: Diagnosis not present

## 2020-02-03 DIAGNOSIS — M4722 Other spondylosis with radiculopathy, cervical region: Secondary | ICD-10-CM | POA: Diagnosis not present

## 2020-02-03 DIAGNOSIS — F0280 Dementia in other diseases classified elsewhere without behavioral disturbance: Secondary | ICD-10-CM | POA: Diagnosis not present

## 2020-02-03 DIAGNOSIS — G4700 Insomnia, unspecified: Secondary | ICD-10-CM | POA: Diagnosis not present

## 2020-02-03 DIAGNOSIS — L03211 Cellulitis of face: Secondary | ICD-10-CM | POA: Diagnosis not present

## 2020-02-03 DIAGNOSIS — E1151 Type 2 diabetes mellitus with diabetic peripheral angiopathy without gangrene: Secondary | ICD-10-CM | POA: Diagnosis not present

## 2020-02-03 DIAGNOSIS — J301 Allergic rhinitis due to pollen: Secondary | ICD-10-CM | POA: Diagnosis not present

## 2020-02-03 DIAGNOSIS — Z96652 Presence of left artificial knee joint: Secondary | ICD-10-CM | POA: Diagnosis not present

## 2020-02-03 DIAGNOSIS — R410 Disorientation, unspecified: Secondary | ICD-10-CM | POA: Diagnosis not present

## 2020-02-03 DIAGNOSIS — H353231 Exudative age-related macular degeneration, bilateral, with active choroidal neovascularization: Secondary | ICD-10-CM | POA: Diagnosis not present

## 2020-02-03 DIAGNOSIS — K2960 Other gastritis without bleeding: Secondary | ICD-10-CM | POA: Diagnosis not present

## 2020-02-03 DIAGNOSIS — R278 Other lack of coordination: Secondary | ICD-10-CM | POA: Diagnosis not present

## 2020-02-03 DIAGNOSIS — E8881 Metabolic syndrome: Secondary | ICD-10-CM | POA: Diagnosis not present

## 2020-02-03 DIAGNOSIS — N764 Abscess of vulva: Secondary | ICD-10-CM | POA: Diagnosis not present

## 2020-02-03 DIAGNOSIS — C711 Malignant neoplasm of frontal lobe: Secondary | ICD-10-CM | POA: Diagnosis not present

## 2020-02-03 DIAGNOSIS — I442 Atrioventricular block, complete: Secondary | ICD-10-CM | POA: Diagnosis not present

## 2020-02-03 DIAGNOSIS — G894 Chronic pain syndrome: Secondary | ICD-10-CM | POA: Diagnosis not present

## 2020-02-03 DIAGNOSIS — K929 Disease of digestive system, unspecified: Secondary | ICD-10-CM | POA: Diagnosis not present

## 2020-02-03 DIAGNOSIS — H5213 Myopia, bilateral: Secondary | ICD-10-CM | POA: Diagnosis not present

## 2020-02-03 DIAGNOSIS — B351 Tinea unguium: Secondary | ICD-10-CM | POA: Diagnosis not present

## 2020-02-03 DIAGNOSIS — R262 Difficulty in walking, not elsewhere classified: Secondary | ICD-10-CM | POA: Diagnosis not present

## 2020-02-03 DIAGNOSIS — M5416 Radiculopathy, lumbar region: Secondary | ICD-10-CM | POA: Diagnosis not present

## 2020-02-03 DIAGNOSIS — Z8546 Personal history of malignant neoplasm of prostate: Secondary | ICD-10-CM | POA: Diagnosis not present

## 2020-02-03 DIAGNOSIS — S82832D Other fracture of upper and lower end of left fibula, subsequent encounter for closed fracture with routine healing: Secondary | ICD-10-CM | POA: Diagnosis not present

## 2020-02-03 DIAGNOSIS — M25531 Pain in right wrist: Secondary | ICD-10-CM | POA: Diagnosis not present

## 2020-02-03 DIAGNOSIS — K5900 Constipation, unspecified: Secondary | ICD-10-CM | POA: Diagnosis not present

## 2020-02-03 DIAGNOSIS — J948 Other specified pleural conditions: Secondary | ICD-10-CM | POA: Diagnosis not present

## 2020-02-03 DIAGNOSIS — E1121 Type 2 diabetes mellitus with diabetic nephropathy: Secondary | ICD-10-CM | POA: Diagnosis not present

## 2020-02-03 DIAGNOSIS — Z86718 Personal history of other venous thrombosis and embolism: Secondary | ICD-10-CM | POA: Diagnosis not present

## 2020-02-03 DIAGNOSIS — H269 Unspecified cataract: Secondary | ICD-10-CM | POA: Diagnosis not present

## 2020-02-03 DIAGNOSIS — I119 Hypertensive heart disease without heart failure: Secondary | ICD-10-CM | POA: Diagnosis not present

## 2020-02-03 DIAGNOSIS — E7801 Familial hypercholesterolemia: Secondary | ICD-10-CM | POA: Diagnosis not present

## 2020-02-03 DIAGNOSIS — D508 Other iron deficiency anemias: Secondary | ICD-10-CM | POA: Diagnosis not present

## 2020-02-03 DIAGNOSIS — Z7901 Long term (current) use of anticoagulants: Secondary | ICD-10-CM | POA: Diagnosis not present

## 2020-02-03 DIAGNOSIS — G4734 Idiopathic sleep related nonobstructive alveolar hypoventilation: Secondary | ICD-10-CM | POA: Diagnosis not present

## 2020-02-03 DIAGNOSIS — M48061 Spinal stenosis, lumbar region without neurogenic claudication: Secondary | ICD-10-CM | POA: Diagnosis not present

## 2020-02-03 DIAGNOSIS — R6520 Severe sepsis without septic shock: Secondary | ICD-10-CM | POA: Diagnosis not present

## 2020-02-03 DIAGNOSIS — Z79891 Long term (current) use of opiate analgesic: Secondary | ICD-10-CM | POA: Diagnosis not present

## 2020-02-03 DIAGNOSIS — R350 Frequency of micturition: Secondary | ICD-10-CM | POA: Diagnosis not present

## 2020-02-03 DIAGNOSIS — E538 Deficiency of other specified B group vitamins: Secondary | ICD-10-CM | POA: Diagnosis not present

## 2020-02-03 DIAGNOSIS — E1169 Type 2 diabetes mellitus with other specified complication: Secondary | ICD-10-CM | POA: Diagnosis not present

## 2020-02-03 DIAGNOSIS — S60221A Contusion of right hand, initial encounter: Secondary | ICD-10-CM | POA: Diagnosis not present

## 2020-02-03 DIAGNOSIS — G2581 Restless legs syndrome: Secondary | ICD-10-CM | POA: Diagnosis not present

## 2020-02-03 DIAGNOSIS — L0390 Cellulitis, unspecified: Secondary | ICD-10-CM | POA: Diagnosis not present

## 2020-02-03 DIAGNOSIS — F4310 Post-traumatic stress disorder, unspecified: Secondary | ICD-10-CM | POA: Diagnosis not present

## 2020-02-03 DIAGNOSIS — W1789XA Other fall from one level to another, initial encounter: Secondary | ICD-10-CM | POA: Diagnosis not present

## 2020-02-03 DIAGNOSIS — J3089 Other allergic rhinitis: Secondary | ICD-10-CM | POA: Diagnosis not present

## 2020-02-03 DIAGNOSIS — L814 Other melanin hyperpigmentation: Secondary | ICD-10-CM | POA: Diagnosis not present

## 2020-02-03 DIAGNOSIS — C787 Secondary malignant neoplasm of liver and intrahepatic bile duct: Secondary | ICD-10-CM | POA: Diagnosis not present

## 2020-02-03 DIAGNOSIS — R972 Elevated prostate specific antigen [PSA]: Secondary | ICD-10-CM | POA: Diagnosis not present

## 2020-02-03 DIAGNOSIS — K219 Gastro-esophageal reflux disease without esophagitis: Secondary | ICD-10-CM | POA: Diagnosis not present

## 2020-02-03 DIAGNOSIS — Z1211 Encounter for screening for malignant neoplasm of colon: Secondary | ICD-10-CM | POA: Diagnosis not present

## 2020-02-03 DIAGNOSIS — R1310 Dysphagia, unspecified: Secondary | ICD-10-CM | POA: Diagnosis not present

## 2020-02-03 DIAGNOSIS — I890 Lymphedema, not elsewhere classified: Secondary | ICD-10-CM | POA: Diagnosis not present

## 2020-02-03 DIAGNOSIS — N183 Chronic kidney disease, stage 3 unspecified: Secondary | ICD-10-CM | POA: Diagnosis not present

## 2020-02-03 DIAGNOSIS — Z3169 Encounter for other general counseling and advice on procreation: Secondary | ICD-10-CM | POA: Diagnosis not present

## 2020-02-03 DIAGNOSIS — Z87448 Personal history of other diseases of urinary system: Secondary | ICD-10-CM | POA: Diagnosis not present

## 2020-02-03 DIAGNOSIS — C884 Extranodal marginal zone B-cell lymphoma of mucosa-associated lymphoid tissue [MALT-lymphoma]: Secondary | ICD-10-CM | POA: Diagnosis not present

## 2020-02-03 DIAGNOSIS — N2581 Secondary hyperparathyroidism of renal origin: Secondary | ICD-10-CM | POA: Diagnosis not present

## 2020-02-03 DIAGNOSIS — M436 Torticollis: Secondary | ICD-10-CM | POA: Diagnosis not present

## 2020-02-03 DIAGNOSIS — E1149 Type 2 diabetes mellitus with other diabetic neurological complication: Secondary | ICD-10-CM | POA: Diagnosis not present

## 2020-02-03 DIAGNOSIS — Z79811 Long term (current) use of aromatase inhibitors: Secondary | ICD-10-CM | POA: Diagnosis not present

## 2020-02-03 DIAGNOSIS — J189 Pneumonia, unspecified organism: Secondary | ICD-10-CM | POA: Diagnosis not present

## 2020-02-03 DIAGNOSIS — I69398 Other sequelae of cerebral infarction: Secondary | ICD-10-CM | POA: Diagnosis not present

## 2020-02-03 DIAGNOSIS — W19XXXA Unspecified fall, initial encounter: Secondary | ICD-10-CM | POA: Diagnosis not present

## 2020-02-03 DIAGNOSIS — Z4682 Encounter for fitting and adjustment of non-vascular catheter: Secondary | ICD-10-CM | POA: Diagnosis not present

## 2020-02-03 DIAGNOSIS — Z7409 Other reduced mobility: Secondary | ICD-10-CM | POA: Diagnosis not present

## 2020-02-03 DIAGNOSIS — Z905 Acquired absence of kidney: Secondary | ICD-10-CM | POA: Diagnosis not present

## 2020-02-03 DIAGNOSIS — Z5112 Encounter for antineoplastic immunotherapy: Secondary | ICD-10-CM | POA: Diagnosis not present

## 2020-02-03 DIAGNOSIS — Z952 Presence of prosthetic heart valve: Secondary | ICD-10-CM | POA: Diagnosis not present

## 2020-02-03 DIAGNOSIS — I480 Paroxysmal atrial fibrillation: Secondary | ICD-10-CM | POA: Diagnosis not present

## 2020-02-03 DIAGNOSIS — Z992 Dependence on renal dialysis: Secondary | ICD-10-CM | POA: Diagnosis not present

## 2020-02-03 DIAGNOSIS — Z471 Aftercare following joint replacement surgery: Secondary | ICD-10-CM | POA: Diagnosis not present

## 2020-02-03 DIAGNOSIS — E875 Hyperkalemia: Secondary | ICD-10-CM | POA: Diagnosis not present

## 2020-02-03 DIAGNOSIS — I428 Other cardiomyopathies: Secondary | ICD-10-CM | POA: Diagnosis not present

## 2020-02-03 DIAGNOSIS — S46112A Strain of muscle, fascia and tendon of long head of biceps, left arm, initial encounter: Secondary | ICD-10-CM | POA: Diagnosis not present

## 2020-02-03 DIAGNOSIS — J9811 Atelectasis: Secondary | ICD-10-CM | POA: Diagnosis not present

## 2020-02-03 DIAGNOSIS — Z125 Encounter for screening for malignant neoplasm of prostate: Secondary | ICD-10-CM | POA: Diagnosis not present

## 2020-02-03 DIAGNOSIS — J029 Acute pharyngitis, unspecified: Secondary | ICD-10-CM | POA: Diagnosis not present

## 2020-02-03 DIAGNOSIS — G9341 Metabolic encephalopathy: Secondary | ICD-10-CM | POA: Diagnosis not present

## 2020-02-03 DIAGNOSIS — K649 Unspecified hemorrhoids: Secondary | ICD-10-CM | POA: Diagnosis not present

## 2020-02-03 DIAGNOSIS — I129 Hypertensive chronic kidney disease with stage 1 through stage 4 chronic kidney disease, or unspecified chronic kidney disease: Secondary | ICD-10-CM | POA: Diagnosis not present

## 2020-02-03 DIAGNOSIS — Z0000 Encounter for general adult medical examination without abnormal findings: Secondary | ICD-10-CM | POA: Diagnosis not present

## 2020-02-03 DIAGNOSIS — Z85528 Personal history of other malignant neoplasm of kidney: Secondary | ICD-10-CM | POA: Diagnosis not present

## 2020-02-03 DIAGNOSIS — S76011D Strain of muscle, fascia and tendon of right hip, subsequent encounter: Secondary | ICD-10-CM | POA: Diagnosis not present

## 2020-02-03 DIAGNOSIS — M5441 Lumbago with sciatica, right side: Secondary | ICD-10-CM | POA: Diagnosis not present

## 2020-02-03 DIAGNOSIS — J449 Chronic obstructive pulmonary disease, unspecified: Secondary | ICD-10-CM | POA: Diagnosis not present

## 2020-02-03 DIAGNOSIS — R55 Syncope and collapse: Secondary | ICD-10-CM | POA: Diagnosis not present

## 2020-02-03 DIAGNOSIS — S199XXA Unspecified injury of neck, initial encounter: Secondary | ICD-10-CM | POA: Diagnosis not present

## 2020-02-03 DIAGNOSIS — Z111 Encounter for screening for respiratory tuberculosis: Secondary | ICD-10-CM | POA: Diagnosis not present

## 2020-02-03 DIAGNOSIS — M6281 Muscle weakness (generalized): Secondary | ICD-10-CM | POA: Diagnosis not present

## 2020-02-03 DIAGNOSIS — M25462 Effusion, left knee: Secondary | ICD-10-CM | POA: Diagnosis not present

## 2020-02-03 DIAGNOSIS — R1111 Vomiting without nausea: Secondary | ICD-10-CM | POA: Diagnosis not present

## 2020-02-03 DIAGNOSIS — T7800XA Anaphylactic reaction due to unspecified food, initial encounter: Secondary | ICD-10-CM | POA: Diagnosis not present

## 2020-02-03 DIAGNOSIS — L97222 Non-pressure chronic ulcer of left calf with fat layer exposed: Secondary | ICD-10-CM | POA: Diagnosis not present

## 2020-02-03 DIAGNOSIS — M25562 Pain in left knee: Secondary | ICD-10-CM | POA: Diagnosis not present

## 2020-02-03 DIAGNOSIS — R509 Fever, unspecified: Secondary | ICD-10-CM | POA: Diagnosis not present

## 2020-02-03 DIAGNOSIS — R4189 Other symptoms and signs involving cognitive functions and awareness: Secondary | ICD-10-CM | POA: Diagnosis not present

## 2020-02-03 DIAGNOSIS — G35 Multiple sclerosis: Secondary | ICD-10-CM | POA: Diagnosis not present

## 2020-02-03 DIAGNOSIS — D72829 Elevated white blood cell count, unspecified: Secondary | ICD-10-CM | POA: Diagnosis not present

## 2020-02-03 DIAGNOSIS — F339 Major depressive disorder, recurrent, unspecified: Secondary | ICD-10-CM | POA: Diagnosis not present

## 2020-02-03 DIAGNOSIS — R41841 Cognitive communication deficit: Secondary | ICD-10-CM | POA: Diagnosis not present

## 2020-02-03 DIAGNOSIS — F331 Major depressive disorder, recurrent, moderate: Secondary | ICD-10-CM | POA: Diagnosis not present

## 2020-02-03 DIAGNOSIS — Z9981 Dependence on supplemental oxygen: Secondary | ICD-10-CM | POA: Diagnosis not present

## 2020-02-03 DIAGNOSIS — Z124 Encounter for screening for malignant neoplasm of cervix: Secondary | ICD-10-CM | POA: Diagnosis not present

## 2020-02-03 DIAGNOSIS — R2689 Other abnormalities of gait and mobility: Secondary | ICD-10-CM | POA: Diagnosis not present

## 2020-02-03 DIAGNOSIS — A411 Sepsis due to other specified staphylococcus: Secondary | ICD-10-CM | POA: Diagnosis not present

## 2020-02-03 DIAGNOSIS — J90 Pleural effusion, not elsewhere classified: Secondary | ICD-10-CM | POA: Diagnosis not present

## 2020-02-03 DIAGNOSIS — F3130 Bipolar disorder, current episode depressed, mild or moderate severity, unspecified: Secondary | ICD-10-CM | POA: Diagnosis not present

## 2020-02-03 DIAGNOSIS — Z170 Estrogen receptor positive status [ER+]: Secondary | ICD-10-CM | POA: Diagnosis not present

## 2020-02-03 DIAGNOSIS — K651 Peritoneal abscess: Secondary | ICD-10-CM | POA: Diagnosis not present

## 2020-02-03 DIAGNOSIS — K5909 Other constipation: Secondary | ICD-10-CM | POA: Diagnosis not present

## 2020-02-03 DIAGNOSIS — Z96653 Presence of artificial knee joint, bilateral: Secondary | ICD-10-CM | POA: Diagnosis not present

## 2020-02-03 DIAGNOSIS — M7541 Impingement syndrome of right shoulder: Secondary | ICD-10-CM | POA: Diagnosis not present

## 2020-02-03 DIAGNOSIS — E1140 Type 2 diabetes mellitus with diabetic neuropathy, unspecified: Secondary | ICD-10-CM | POA: Diagnosis not present

## 2020-02-03 DIAGNOSIS — D229 Melanocytic nevi, unspecified: Secondary | ICD-10-CM | POA: Diagnosis not present

## 2020-02-03 DIAGNOSIS — Z79899 Other long term (current) drug therapy: Secondary | ICD-10-CM | POA: Diagnosis not present

## 2020-02-03 DIAGNOSIS — R413 Other amnesia: Secondary | ICD-10-CM | POA: Diagnosis not present

## 2020-02-03 DIAGNOSIS — S63501A Unspecified sprain of right wrist, initial encounter: Secondary | ICD-10-CM | POA: Diagnosis not present

## 2020-02-03 DIAGNOSIS — Z713 Dietary counseling and surveillance: Secondary | ICD-10-CM | POA: Diagnosis not present

## 2020-02-03 DIAGNOSIS — I4891 Unspecified atrial fibrillation: Secondary | ICD-10-CM | POA: Diagnosis not present

## 2020-02-03 DIAGNOSIS — D519 Vitamin B12 deficiency anemia, unspecified: Secondary | ICD-10-CM | POA: Diagnosis not present

## 2020-02-03 DIAGNOSIS — L820 Inflamed seborrheic keratosis: Secondary | ICD-10-CM | POA: Diagnosis not present

## 2020-02-03 DIAGNOSIS — J302 Other seasonal allergic rhinitis: Secondary | ICD-10-CM | POA: Diagnosis not present

## 2020-02-03 DIAGNOSIS — Z634 Disappearance and death of family member: Secondary | ICD-10-CM | POA: Diagnosis not present

## 2020-02-03 DIAGNOSIS — N3943 Post-void dribbling: Secondary | ICD-10-CM | POA: Diagnosis not present

## 2020-02-03 DIAGNOSIS — M1990 Unspecified osteoarthritis, unspecified site: Secondary | ICD-10-CM | POA: Diagnosis not present

## 2020-02-03 DIAGNOSIS — H9193 Unspecified hearing loss, bilateral: Secondary | ICD-10-CM | POA: Diagnosis not present

## 2020-02-03 DIAGNOSIS — M810 Age-related osteoporosis without current pathological fracture: Secondary | ICD-10-CM | POA: Diagnosis not present

## 2020-02-03 DIAGNOSIS — M25551 Pain in right hip: Secondary | ICD-10-CM | POA: Diagnosis not present

## 2020-02-03 DIAGNOSIS — H8109 Meniere's disease, unspecified ear: Secondary | ICD-10-CM | POA: Diagnosis not present

## 2020-02-03 DIAGNOSIS — Z96612 Presence of left artificial shoulder joint: Secondary | ICD-10-CM | POA: Diagnosis not present

## 2020-02-03 DIAGNOSIS — F0391 Unspecified dementia with behavioral disturbance: Secondary | ICD-10-CM | POA: Diagnosis not present

## 2020-02-03 DIAGNOSIS — I82453 Acute embolism and thrombosis of peroneal vein, bilateral: Secondary | ICD-10-CM | POA: Diagnosis not present

## 2020-02-03 DIAGNOSIS — R5381 Other malaise: Secondary | ICD-10-CM | POA: Diagnosis not present

## 2020-02-03 DIAGNOSIS — C641 Malignant neoplasm of right kidney, except renal pelvis: Secondary | ICD-10-CM | POA: Diagnosis not present

## 2020-02-03 DIAGNOSIS — M19042 Primary osteoarthritis, left hand: Secondary | ICD-10-CM | POA: Diagnosis not present

## 2020-02-03 DIAGNOSIS — Z85828 Personal history of other malignant neoplasm of skin: Secondary | ICD-10-CM | POA: Diagnosis not present

## 2020-02-03 DIAGNOSIS — R748 Abnormal levels of other serum enzymes: Secondary | ICD-10-CM | POA: Diagnosis not present

## 2020-02-03 DIAGNOSIS — G4730 Sleep apnea, unspecified: Secondary | ICD-10-CM | POA: Diagnosis not present

## 2020-02-03 DIAGNOSIS — M7062 Trochanteric bursitis, left hip: Secondary | ICD-10-CM | POA: Diagnosis not present

## 2020-02-03 DIAGNOSIS — K668 Other specified disorders of peritoneum: Secondary | ICD-10-CM | POA: Diagnosis not present

## 2020-02-03 DIAGNOSIS — C159 Malignant neoplasm of esophagus, unspecified: Secondary | ICD-10-CM | POA: Diagnosis not present

## 2020-02-03 DIAGNOSIS — R911 Solitary pulmonary nodule: Secondary | ICD-10-CM | POA: Diagnosis not present

## 2020-02-03 DIAGNOSIS — M0589 Other rheumatoid arthritis with rheumatoid factor of multiple sites: Secondary | ICD-10-CM | POA: Diagnosis not present

## 2020-02-03 DIAGNOSIS — M5136 Other intervertebral disc degeneration, lumbar region: Secondary | ICD-10-CM | POA: Diagnosis not present

## 2020-02-03 DIAGNOSIS — N179 Acute kidney failure, unspecified: Secondary | ICD-10-CM | POA: Diagnosis not present

## 2020-02-03 DIAGNOSIS — I4821 Permanent atrial fibrillation: Secondary | ICD-10-CM | POA: Diagnosis not present

## 2020-02-03 DIAGNOSIS — I5042 Chronic combined systolic (congestive) and diastolic (congestive) heart failure: Secondary | ICD-10-CM | POA: Diagnosis not present

## 2020-02-03 DIAGNOSIS — F419 Anxiety disorder, unspecified: Secondary | ICD-10-CM | POA: Diagnosis not present

## 2020-02-03 DIAGNOSIS — C44712 Basal cell carcinoma of skin of right lower limb, including hip: Secondary | ICD-10-CM | POA: Diagnosis not present

## 2020-02-03 DIAGNOSIS — M9901 Segmental and somatic dysfunction of cervical region: Secondary | ICD-10-CM | POA: Diagnosis not present

## 2020-02-03 DIAGNOSIS — I4892 Unspecified atrial flutter: Secondary | ICD-10-CM | POA: Diagnosis not present

## 2020-02-03 DIAGNOSIS — M4326 Fusion of spine, lumbar region: Secondary | ICD-10-CM | POA: Diagnosis not present

## 2020-02-03 DIAGNOSIS — F411 Generalized anxiety disorder: Secondary | ICD-10-CM | POA: Diagnosis not present

## 2020-02-03 DIAGNOSIS — J45909 Unspecified asthma, uncomplicated: Secondary | ICD-10-CM | POA: Diagnosis not present

## 2020-02-03 DIAGNOSIS — U071 COVID-19: Secondary | ICD-10-CM | POA: Diagnosis not present

## 2020-02-03 DIAGNOSIS — Z7982 Long term (current) use of aspirin: Secondary | ICD-10-CM | POA: Diagnosis not present

## 2020-02-03 DIAGNOSIS — D631 Anemia in chronic kidney disease: Secondary | ICD-10-CM | POA: Diagnosis not present

## 2020-02-03 DIAGNOSIS — N393 Stress incontinence (female) (male): Secondary | ICD-10-CM | POA: Diagnosis not present

## 2020-02-03 DIAGNOSIS — K631 Perforation of intestine (nontraumatic): Secondary | ICD-10-CM | POA: Diagnosis not present

## 2020-02-03 DIAGNOSIS — R531 Weakness: Secondary | ICD-10-CM | POA: Diagnosis not present

## 2020-02-03 DIAGNOSIS — F5101 Primary insomnia: Secondary | ICD-10-CM | POA: Diagnosis not present

## 2020-02-03 DIAGNOSIS — M25561 Pain in right knee: Secondary | ICD-10-CM | POA: Diagnosis not present

## 2020-02-03 DIAGNOSIS — Z4803 Encounter for change or removal of drains: Secondary | ICD-10-CM | POA: Diagnosis not present

## 2020-02-03 DIAGNOSIS — M542 Cervicalgia: Secondary | ICD-10-CM | POA: Diagnosis not present

## 2020-02-03 DIAGNOSIS — S52572A Other intraarticular fracture of lower end of left radius, initial encounter for closed fracture: Secondary | ICD-10-CM | POA: Diagnosis not present

## 2020-02-03 DIAGNOSIS — S0990XA Unspecified injury of head, initial encounter: Secondary | ICD-10-CM | POA: Diagnosis not present

## 2020-02-03 DIAGNOSIS — H52223 Regular astigmatism, bilateral: Secondary | ICD-10-CM | POA: Diagnosis not present

## 2020-02-03 DIAGNOSIS — C250 Malignant neoplasm of head of pancreas: Secondary | ICD-10-CM | POA: Diagnosis not present

## 2020-02-03 DIAGNOSIS — E782 Mixed hyperlipidemia: Secondary | ICD-10-CM | POA: Diagnosis not present

## 2020-02-03 DIAGNOSIS — Z90710 Acquired absence of both cervix and uterus: Secondary | ICD-10-CM | POA: Diagnosis not present

## 2020-02-03 DIAGNOSIS — L97812 Non-pressure chronic ulcer of other part of right lower leg with fat layer exposed: Secondary | ICD-10-CM | POA: Diagnosis not present

## 2020-02-03 DIAGNOSIS — M5442 Lumbago with sciatica, left side: Secondary | ICD-10-CM | POA: Diagnosis not present

## 2020-02-03 DIAGNOSIS — M25612 Stiffness of left shoulder, not elsewhere classified: Secondary | ICD-10-CM | POA: Diagnosis not present

## 2020-02-03 DIAGNOSIS — I83028 Varicose veins of left lower extremity with ulcer other part of lower leg: Secondary | ICD-10-CM | POA: Diagnosis not present

## 2020-02-03 DIAGNOSIS — G20 Parkinson's disease: Secondary | ICD-10-CM | POA: Diagnosis not present

## 2020-02-03 DIAGNOSIS — R627 Adult failure to thrive: Secondary | ICD-10-CM | POA: Diagnosis not present

## 2020-02-03 DIAGNOSIS — F330 Major depressive disorder, recurrent, mild: Secondary | ICD-10-CM | POA: Diagnosis not present

## 2020-02-03 DIAGNOSIS — M79652 Pain in left thigh: Secondary | ICD-10-CM | POA: Diagnosis not present

## 2020-02-03 DIAGNOSIS — G8929 Other chronic pain: Secondary | ICD-10-CM | POA: Diagnosis not present

## 2020-02-03 DIAGNOSIS — I2609 Other pulmonary embolism with acute cor pulmonale: Secondary | ICD-10-CM | POA: Diagnosis not present

## 2020-02-03 DIAGNOSIS — G629 Polyneuropathy, unspecified: Secondary | ICD-10-CM | POA: Diagnosis not present

## 2020-02-03 DIAGNOSIS — N1831 Chronic kidney disease, stage 3a: Secondary | ICD-10-CM | POA: Diagnosis not present

## 2020-02-03 DIAGNOSIS — I120 Hypertensive chronic kidney disease with stage 5 chronic kidney disease or end stage renal disease: Secondary | ICD-10-CM | POA: Diagnosis not present

## 2020-02-03 DIAGNOSIS — R112 Nausea with vomiting, unspecified: Secondary | ICD-10-CM | POA: Diagnosis not present

## 2020-02-03 DIAGNOSIS — R140 Abdominal distension (gaseous): Secondary | ICD-10-CM | POA: Diagnosis not present

## 2020-02-03 DIAGNOSIS — M545 Low back pain: Secondary | ICD-10-CM | POA: Diagnosis not present

## 2020-02-03 DIAGNOSIS — K567 Ileus, unspecified: Secondary | ICD-10-CM | POA: Diagnosis not present

## 2020-02-03 DIAGNOSIS — C9001 Multiple myeloma in remission: Secondary | ICD-10-CM | POA: Diagnosis not present

## 2020-02-03 DIAGNOSIS — N824 Other female intestinal-genital tract fistulae: Secondary | ICD-10-CM | POA: Diagnosis not present

## 2020-02-03 DIAGNOSIS — M47816 Spondylosis without myelopathy or radiculopathy, lumbar region: Secondary | ICD-10-CM | POA: Diagnosis not present

## 2020-02-03 DIAGNOSIS — Z20822 Contact with and (suspected) exposure to covid-19: Secondary | ICD-10-CM | POA: Diagnosis not present

## 2020-02-03 DIAGNOSIS — I472 Ventricular tachycardia: Secondary | ICD-10-CM | POA: Diagnosis not present

## 2020-02-03 DIAGNOSIS — R0902 Hypoxemia: Secondary | ICD-10-CM | POA: Diagnosis not present

## 2020-02-03 DIAGNOSIS — Z8551 Personal history of malignant neoplasm of bladder: Secondary | ICD-10-CM | POA: Diagnosis not present

## 2020-02-03 DIAGNOSIS — D751 Secondary polycythemia: Secondary | ICD-10-CM | POA: Diagnosis not present

## 2020-02-03 DIAGNOSIS — M62838 Other muscle spasm: Secondary | ICD-10-CM | POA: Diagnosis not present

## 2020-02-03 DIAGNOSIS — D649 Anemia, unspecified: Secondary | ICD-10-CM | POA: Diagnosis not present

## 2020-02-03 DIAGNOSIS — M79675 Pain in left toe(s): Secondary | ICD-10-CM | POA: Diagnosis not present

## 2020-02-03 DIAGNOSIS — Z794 Long term (current) use of insulin: Secondary | ICD-10-CM | POA: Diagnosis not present

## 2020-02-03 DIAGNOSIS — E7800 Pure hypercholesterolemia, unspecified: Secondary | ICD-10-CM | POA: Diagnosis not present

## 2020-02-03 DIAGNOSIS — Z01812 Encounter for preprocedural laboratory examination: Secondary | ICD-10-CM | POA: Diagnosis not present

## 2020-02-03 DIAGNOSIS — M62551 Muscle wasting and atrophy, not elsewhere classified, right thigh: Secondary | ICD-10-CM | POA: Diagnosis not present

## 2020-02-03 DIAGNOSIS — M25672 Stiffness of left ankle, not elsewhere classified: Secondary | ICD-10-CM | POA: Diagnosis not present

## 2020-02-03 DIAGNOSIS — Z131 Encounter for screening for diabetes mellitus: Secondary | ICD-10-CM | POA: Diagnosis not present

## 2020-02-03 DIAGNOSIS — Z880 Allergy status to penicillin: Secondary | ICD-10-CM | POA: Diagnosis not present

## 2020-02-03 DIAGNOSIS — R260 Ataxic gait: Secondary | ICD-10-CM | POA: Diagnosis not present

## 2020-02-03 DIAGNOSIS — G4733 Obstructive sleep apnea (adult) (pediatric): Secondary | ICD-10-CM | POA: Diagnosis not present

## 2020-02-03 DIAGNOSIS — M531 Cervicobrachial syndrome: Secondary | ICD-10-CM | POA: Diagnosis not present

## 2020-02-03 DIAGNOSIS — M25762 Osteophyte, left knee: Secondary | ICD-10-CM | POA: Diagnosis not present

## 2020-02-03 DIAGNOSIS — T25322A Burn of third degree of left foot, initial encounter: Secondary | ICD-10-CM | POA: Diagnosis not present

## 2020-02-03 DIAGNOSIS — I350 Nonrheumatic aortic (valve) stenosis: Secondary | ICD-10-CM | POA: Diagnosis not present

## 2020-02-03 DIAGNOSIS — F1011 Alcohol abuse, in remission: Secondary | ICD-10-CM | POA: Diagnosis not present

## 2020-02-03 DIAGNOSIS — H3530 Unspecified macular degeneration: Secondary | ICD-10-CM | POA: Diagnosis not present

## 2020-02-03 DIAGNOSIS — R54 Age-related physical debility: Secondary | ICD-10-CM | POA: Diagnosis not present

## 2020-02-03 DIAGNOSIS — I2699 Other pulmonary embolism without acute cor pulmonale: Secondary | ICD-10-CM | POA: Diagnosis not present

## 2020-02-03 DIAGNOSIS — I440 Atrioventricular block, first degree: Secondary | ICD-10-CM | POA: Diagnosis not present

## 2020-02-03 DIAGNOSIS — R0602 Shortness of breath: Secondary | ICD-10-CM | POA: Diagnosis not present

## 2020-02-03 DIAGNOSIS — D61818 Other pancytopenia: Secondary | ICD-10-CM | POA: Diagnosis not present

## 2020-02-03 DIAGNOSIS — K5721 Diverticulitis of large intestine with perforation and abscess with bleeding: Secondary | ICD-10-CM | POA: Diagnosis not present

## 2020-02-03 DIAGNOSIS — M19041 Primary osteoarthritis, right hand: Secondary | ICD-10-CM | POA: Diagnosis not present

## 2020-02-03 DIAGNOSIS — K259 Gastric ulcer, unspecified as acute or chronic, without hemorrhage or perforation: Secondary | ICD-10-CM | POA: Diagnosis not present

## 2020-02-03 DIAGNOSIS — Z87891 Personal history of nicotine dependence: Secondary | ICD-10-CM | POA: Diagnosis not present

## 2020-02-03 DIAGNOSIS — Z743 Need for continuous supervision: Secondary | ICD-10-CM | POA: Diagnosis not present

## 2020-02-03 DIAGNOSIS — F0390 Unspecified dementia without behavioral disturbance: Secondary | ICD-10-CM | POA: Diagnosis not present

## 2020-02-03 DIAGNOSIS — N200 Calculus of kidney: Secondary | ICD-10-CM | POA: Diagnosis not present

## 2020-02-03 DIAGNOSIS — B009 Herpesviral infection, unspecified: Secondary | ICD-10-CM | POA: Diagnosis not present

## 2020-02-03 DIAGNOSIS — R351 Nocturia: Secondary | ICD-10-CM | POA: Diagnosis not present

## 2020-02-03 DIAGNOSIS — C50112 Malignant neoplasm of central portion of left female breast: Secondary | ICD-10-CM | POA: Diagnosis not present

## 2020-02-03 DIAGNOSIS — F324 Major depressive disorder, single episode, in partial remission: Secondary | ICD-10-CM | POA: Diagnosis not present

## 2020-02-03 DIAGNOSIS — M4802 Spinal stenosis, cervical region: Secondary | ICD-10-CM | POA: Diagnosis not present

## 2020-02-03 DIAGNOSIS — T8571XA Infection and inflammatory reaction due to peritoneal dialysis catheter, initial encounter: Secondary | ICD-10-CM | POA: Diagnosis not present

## 2020-02-03 DIAGNOSIS — Z96651 Presence of right artificial knee joint: Secondary | ICD-10-CM | POA: Diagnosis not present

## 2020-02-03 DIAGNOSIS — F332 Major depressive disorder, recurrent severe without psychotic features: Secondary | ICD-10-CM | POA: Diagnosis not present

## 2020-02-03 DIAGNOSIS — N3000 Acute cystitis without hematuria: Secondary | ICD-10-CM | POA: Diagnosis not present

## 2020-02-03 DIAGNOSIS — M94262 Chondromalacia, left knee: Secondary | ICD-10-CM | POA: Diagnosis not present

## 2020-02-03 DIAGNOSIS — R4182 Altered mental status, unspecified: Secondary | ICD-10-CM | POA: Diagnosis not present

## 2020-02-03 DIAGNOSIS — Z8739 Personal history of other diseases of the musculoskeletal system and connective tissue: Secondary | ICD-10-CM | POA: Diagnosis not present

## 2020-02-03 DIAGNOSIS — R002 Palpitations: Secondary | ICD-10-CM | POA: Diagnosis not present

## 2020-02-03 DIAGNOSIS — E11628 Type 2 diabetes mellitus with other skin complications: Secondary | ICD-10-CM | POA: Diagnosis not present

## 2020-02-03 DIAGNOSIS — L578 Other skin changes due to chronic exposure to nonionizing radiation: Secondary | ICD-10-CM | POA: Diagnosis not present

## 2020-02-03 DIAGNOSIS — S42202D Unspecified fracture of upper end of left humerus, subsequent encounter for fracture with routine healing: Secondary | ICD-10-CM | POA: Diagnosis not present

## 2020-02-03 DIAGNOSIS — M25652 Stiffness of left hip, not elsewhere classified: Secondary | ICD-10-CM | POA: Diagnosis not present

## 2020-02-03 DIAGNOSIS — E11319 Type 2 diabetes mellitus with unspecified diabetic retinopathy without macular edema: Secondary | ICD-10-CM | POA: Diagnosis not present

## 2020-02-03 DIAGNOSIS — D509 Iron deficiency anemia, unspecified: Secondary | ICD-10-CM | POA: Diagnosis not present

## 2020-02-03 DIAGNOSIS — L03116 Cellulitis of left lower limb: Secondary | ICD-10-CM | POA: Diagnosis not present

## 2020-02-03 DIAGNOSIS — C3411 Malignant neoplasm of upper lobe, right bronchus or lung: Secondary | ICD-10-CM | POA: Diagnosis not present

## 2020-02-03 DIAGNOSIS — E1152 Type 2 diabetes mellitus with diabetic peripheral angiopathy with gangrene: Secondary | ICD-10-CM | POA: Diagnosis not present

## 2020-02-03 DIAGNOSIS — G3184 Mild cognitive impairment, so stated: Secondary | ICD-10-CM | POA: Diagnosis not present

## 2020-02-03 DIAGNOSIS — S32000D Wedge compression fracture of unspecified lumbar vertebra, subsequent encounter for fracture with routine healing: Secondary | ICD-10-CM | POA: Diagnosis not present

## 2020-02-03 DIAGNOSIS — L97509 Non-pressure chronic ulcer of other part of unspecified foot with unspecified severity: Secondary | ICD-10-CM | POA: Diagnosis not present

## 2020-02-03 DIAGNOSIS — I63342 Cerebral infarction due to thrombosis of left cerebellar artery: Secondary | ICD-10-CM | POA: Diagnosis not present

## 2020-02-03 DIAGNOSIS — R918 Other nonspecific abnormal finding of lung field: Secondary | ICD-10-CM | POA: Diagnosis not present

## 2020-02-03 DIAGNOSIS — I8290 Acute embolism and thrombosis of unspecified vein: Secondary | ICD-10-CM | POA: Diagnosis not present

## 2020-02-03 DIAGNOSIS — J1282 Pneumonia due to coronavirus disease 2019: Secondary | ICD-10-CM | POA: Diagnosis not present

## 2020-02-03 DIAGNOSIS — Z7401 Bed confinement status: Secondary | ICD-10-CM | POA: Diagnosis not present

## 2020-02-03 DIAGNOSIS — M222X2 Patellofemoral disorders, left knee: Secondary | ICD-10-CM | POA: Diagnosis not present

## 2020-02-03 DIAGNOSIS — M47896 Other spondylosis, lumbar region: Secondary | ICD-10-CM | POA: Diagnosis not present

## 2020-02-03 DIAGNOSIS — Z96641 Presence of right artificial hip joint: Secondary | ICD-10-CM | POA: Diagnosis not present

## 2020-02-03 DIAGNOSIS — N828 Other female genital tract fistulae: Secondary | ICD-10-CM | POA: Diagnosis not present

## 2020-02-03 DIAGNOSIS — M25622 Stiffness of left elbow, not elsewhere classified: Secondary | ICD-10-CM | POA: Diagnosis not present

## 2020-02-03 DIAGNOSIS — K625 Hemorrhage of anus and rectum: Secondary | ICD-10-CM | POA: Diagnosis not present

## 2020-02-03 DIAGNOSIS — I5022 Chronic systolic (congestive) heart failure: Secondary | ICD-10-CM | POA: Diagnosis not present

## 2020-02-03 DIAGNOSIS — D696 Thrombocytopenia, unspecified: Secondary | ICD-10-CM | POA: Diagnosis not present

## 2020-02-03 DIAGNOSIS — E1142 Type 2 diabetes mellitus with diabetic polyneuropathy: Secondary | ICD-10-CM | POA: Diagnosis not present

## 2020-02-03 DIAGNOSIS — R279 Unspecified lack of coordination: Secondary | ICD-10-CM | POA: Diagnosis not present

## 2020-02-03 DIAGNOSIS — S46012D Strain of muscle(s) and tendon(s) of the rotator cuff of left shoulder, subsequent encounter: Secondary | ICD-10-CM | POA: Diagnosis not present

## 2020-02-03 DIAGNOSIS — I10 Essential (primary) hypertension: Secondary | ICD-10-CM | POA: Diagnosis not present

## 2020-02-03 DIAGNOSIS — M86171 Other acute osteomyelitis, right ankle and foot: Secondary | ICD-10-CM | POA: Diagnosis not present

## 2020-02-03 DIAGNOSIS — C189 Malignant neoplasm of colon, unspecified: Secondary | ICD-10-CM | POA: Diagnosis not present

## 2020-02-03 DIAGNOSIS — F17210 Nicotine dependence, cigarettes, uncomplicated: Secondary | ICD-10-CM | POA: Diagnosis not present

## 2020-02-03 DIAGNOSIS — N3942 Incontinence without sensory awareness: Secondary | ICD-10-CM | POA: Diagnosis not present

## 2020-02-03 DIAGNOSIS — M2550 Pain in unspecified joint: Secondary | ICD-10-CM | POA: Diagnosis not present

## 2020-02-03 DIAGNOSIS — J4540 Moderate persistent asthma, uncomplicated: Secondary | ICD-10-CM | POA: Diagnosis not present

## 2020-02-03 DIAGNOSIS — Z86010 Personal history of colonic polyps: Secondary | ICD-10-CM | POA: Diagnosis not present

## 2020-02-03 DIAGNOSIS — M4804 Spinal stenosis, thoracic region: Secondary | ICD-10-CM | POA: Diagnosis not present

## 2020-02-03 DIAGNOSIS — Z800 Family history of malignant neoplasm of digestive organs: Secondary | ICD-10-CM | POA: Diagnosis not present

## 2020-02-03 DIAGNOSIS — D500 Iron deficiency anemia secondary to blood loss (chronic): Secondary | ICD-10-CM | POA: Diagnosis not present

## 2020-02-03 DIAGNOSIS — Z1639 Resistance to other specified antimicrobial drug: Secondary | ICD-10-CM | POA: Diagnosis not present

## 2020-02-03 DIAGNOSIS — Z66 Do not resuscitate: Secondary | ICD-10-CM | POA: Diagnosis not present

## 2020-02-03 DIAGNOSIS — C61 Malignant neoplasm of prostate: Secondary | ICD-10-CM | POA: Diagnosis not present

## 2020-02-03 DIAGNOSIS — Z853 Personal history of malignant neoplasm of breast: Secondary | ICD-10-CM | POA: Diagnosis not present

## 2020-02-03 DIAGNOSIS — C50412 Malignant neoplasm of upper-outer quadrant of left female breast: Secondary | ICD-10-CM | POA: Diagnosis not present

## 2020-02-03 DIAGNOSIS — I447 Left bundle-branch block, unspecified: Secondary | ICD-10-CM | POA: Diagnosis not present

## 2020-02-03 DIAGNOSIS — R739 Hyperglycemia, unspecified: Secondary | ICD-10-CM | POA: Diagnosis not present

## 2020-02-03 DIAGNOSIS — M8580 Other specified disorders of bone density and structure, unspecified site: Secondary | ICD-10-CM | POA: Diagnosis not present

## 2020-02-03 DIAGNOSIS — S2241XD Multiple fractures of ribs, right side, subsequent encounter for fracture with routine healing: Secondary | ICD-10-CM | POA: Diagnosis not present

## 2020-02-03 DIAGNOSIS — D225 Melanocytic nevi of trunk: Secondary | ICD-10-CM | POA: Diagnosis not present

## 2020-02-03 DIAGNOSIS — C44719 Basal cell carcinoma of skin of left lower limb, including hip: Secondary | ICD-10-CM | POA: Diagnosis not present

## 2020-02-03 DIAGNOSIS — M9902 Segmental and somatic dysfunction of thoracic region: Secondary | ICD-10-CM | POA: Diagnosis not present

## 2020-02-03 DIAGNOSIS — I959 Hypotension, unspecified: Secondary | ICD-10-CM | POA: Diagnosis not present

## 2020-02-03 DIAGNOSIS — N400 Enlarged prostate without lower urinary tract symptoms: Secondary | ICD-10-CM | POA: Diagnosis not present

## 2020-02-03 DIAGNOSIS — D849 Immunodeficiency, unspecified: Secondary | ICD-10-CM | POA: Diagnosis not present

## 2020-02-03 DIAGNOSIS — Z8669 Personal history of other diseases of the nervous system and sense organs: Secondary | ICD-10-CM | POA: Diagnosis not present

## 2020-02-03 DIAGNOSIS — E43 Unspecified severe protein-calorie malnutrition: Secondary | ICD-10-CM | POA: Diagnosis not present

## 2020-02-03 DIAGNOSIS — R42 Dizziness and giddiness: Secondary | ICD-10-CM | POA: Diagnosis not present

## 2020-02-03 DIAGNOSIS — E291 Testicular hypofunction: Secondary | ICD-10-CM | POA: Diagnosis not present

## 2020-02-03 DIAGNOSIS — K2100 Gastro-esophageal reflux disease with esophagitis, without bleeding: Secondary | ICD-10-CM | POA: Diagnosis not present

## 2020-02-03 DIAGNOSIS — I130 Hypertensive heart and chronic kidney disease with heart failure and stage 1 through stage 4 chronic kidney disease, or unspecified chronic kidney disease: Secondary | ICD-10-CM | POA: Diagnosis not present

## 2020-02-03 DIAGNOSIS — F39 Unspecified mood [affective] disorder: Secondary | ICD-10-CM | POA: Diagnosis not present

## 2020-02-03 DIAGNOSIS — D0461 Carcinoma in situ of skin of right upper limb, including shoulder: Secondary | ICD-10-CM | POA: Diagnosis not present

## 2020-02-03 DIAGNOSIS — N3941 Urge incontinence: Secondary | ICD-10-CM | POA: Diagnosis not present

## 2020-02-03 DIAGNOSIS — I2729 Other secondary pulmonary hypertension: Secondary | ICD-10-CM | POA: Diagnosis not present

## 2020-02-03 DIAGNOSIS — Z923 Personal history of irradiation: Secondary | ICD-10-CM | POA: Diagnosis not present

## 2020-02-03 DIAGNOSIS — I495 Sick sinus syndrome: Secondary | ICD-10-CM | POA: Diagnosis not present

## 2020-02-03 DIAGNOSIS — I2510 Atherosclerotic heart disease of native coronary artery without angina pectoris: Secondary | ICD-10-CM | POA: Diagnosis not present

## 2020-02-03 DIAGNOSIS — Z01818 Encounter for other preprocedural examination: Secondary | ICD-10-CM | POA: Diagnosis not present

## 2020-02-03 DIAGNOSIS — E039 Hypothyroidism, unspecified: Secondary | ICD-10-CM | POA: Diagnosis not present

## 2020-02-03 DIAGNOSIS — D638 Anemia in other chronic diseases classified elsewhere: Secondary | ICD-10-CM | POA: Diagnosis not present

## 2020-02-03 DIAGNOSIS — N186 End stage renal disease: Secondary | ICD-10-CM | POA: Diagnosis not present

## 2020-02-03 DIAGNOSIS — H349 Unspecified retinal vascular occlusion: Secondary | ICD-10-CM | POA: Diagnosis not present

## 2020-02-03 DIAGNOSIS — I70261 Atherosclerosis of native arteries of extremities with gangrene, right leg: Secondary | ICD-10-CM | POA: Diagnosis not present

## 2020-02-03 DIAGNOSIS — I517 Cardiomegaly: Secondary | ICD-10-CM | POA: Diagnosis not present

## 2020-02-03 DIAGNOSIS — R300 Dysuria: Secondary | ICD-10-CM | POA: Diagnosis not present

## 2020-02-03 DIAGNOSIS — C3490 Malignant neoplasm of unspecified part of unspecified bronchus or lung: Secondary | ICD-10-CM | POA: Diagnosis not present

## 2020-02-03 DIAGNOSIS — M76821 Posterior tibial tendinitis, right leg: Secondary | ICD-10-CM | POA: Diagnosis not present

## 2020-02-03 DIAGNOSIS — J438 Other emphysema: Secondary | ICD-10-CM | POA: Diagnosis not present

## 2020-02-03 DIAGNOSIS — M41119 Juvenile idiopathic scoliosis, site unspecified: Secondary | ICD-10-CM | POA: Diagnosis not present

## 2020-02-03 DIAGNOSIS — Z20828 Contact with and (suspected) exposure to other viral communicable diseases: Secondary | ICD-10-CM | POA: Diagnosis not present

## 2020-02-03 DIAGNOSIS — I482 Chronic atrial fibrillation, unspecified: Secondary | ICD-10-CM | POA: Diagnosis not present

## 2020-02-03 DIAGNOSIS — Z452 Encounter for adjustment and management of vascular access device: Secondary | ICD-10-CM | POA: Diagnosis not present

## 2020-02-03 DIAGNOSIS — M1712 Unilateral primary osteoarthritis, left knee: Secondary | ICD-10-CM | POA: Diagnosis not present

## 2020-02-03 DIAGNOSIS — E860 Dehydration: Secondary | ICD-10-CM | POA: Diagnosis not present

## 2020-02-03 DIAGNOSIS — E785 Hyperlipidemia, unspecified: Secondary | ICD-10-CM | POA: Diagnosis not present

## 2020-02-03 DIAGNOSIS — E119 Type 2 diabetes mellitus without complications: Secondary | ICD-10-CM | POA: Diagnosis not present

## 2020-02-03 DIAGNOSIS — S143XXD Injury of brachial plexus, subsequent encounter: Secondary | ICD-10-CM | POA: Diagnosis not present

## 2020-02-03 DIAGNOSIS — R319 Hematuria, unspecified: Secondary | ICD-10-CM | POA: Diagnosis not present

## 2020-02-03 DIAGNOSIS — R188 Other ascites: Secondary | ICD-10-CM | POA: Diagnosis not present

## 2020-02-03 DIAGNOSIS — J45991 Cough variant asthma: Secondary | ICD-10-CM | POA: Diagnosis not present

## 2020-02-03 DIAGNOSIS — K2101 Gastro-esophageal reflux disease with esophagitis, with bleeding: Secondary | ICD-10-CM | POA: Diagnosis not present

## 2020-02-03 DIAGNOSIS — S72142A Displaced intertrochanteric fracture of left femur, initial encounter for closed fracture: Secondary | ICD-10-CM | POA: Diagnosis not present

## 2020-02-03 DIAGNOSIS — N401 Enlarged prostate with lower urinary tract symptoms: Secondary | ICD-10-CM | POA: Diagnosis not present

## 2020-02-03 DIAGNOSIS — M778 Other enthesopathies, not elsewhere classified: Secondary | ICD-10-CM | POA: Diagnosis not present

## 2020-02-03 DIAGNOSIS — G301 Alzheimer's disease with late onset: Secondary | ICD-10-CM | POA: Diagnosis not present

## 2020-02-03 DIAGNOSIS — M25611 Stiffness of right shoulder, not elsewhere classified: Secondary | ICD-10-CM | POA: Diagnosis not present

## 2020-02-03 DIAGNOSIS — J8410 Pulmonary fibrosis, unspecified: Secondary | ICD-10-CM | POA: Diagnosis not present

## 2020-02-03 DIAGNOSIS — E118 Type 2 diabetes mellitus with unspecified complications: Secondary | ICD-10-CM | POA: Diagnosis not present

## 2020-02-03 DIAGNOSIS — R52 Pain, unspecified: Secondary | ICD-10-CM | POA: Diagnosis not present

## 2020-02-03 DIAGNOSIS — M9903 Segmental and somatic dysfunction of lumbar region: Secondary | ICD-10-CM | POA: Diagnosis not present

## 2020-02-03 DIAGNOSIS — N3090 Cystitis, unspecified without hematuria: Secondary | ICD-10-CM | POA: Diagnosis not present

## 2020-02-03 DIAGNOSIS — C689 Malignant neoplasm of urinary organ, unspecified: Secondary | ICD-10-CM | POA: Diagnosis not present

## 2020-02-03 DIAGNOSIS — R58 Hemorrhage, not elsewhere classified: Secondary | ICD-10-CM | POA: Diagnosis not present

## 2020-02-03 DIAGNOSIS — Z96642 Presence of left artificial hip joint: Secondary | ICD-10-CM | POA: Diagnosis not present

## 2020-02-03 DIAGNOSIS — H6123 Impacted cerumen, bilateral: Secondary | ICD-10-CM | POA: Diagnosis not present

## 2020-02-03 DIAGNOSIS — Z6841 Body Mass Index (BMI) 40.0 and over, adult: Secondary | ICD-10-CM | POA: Diagnosis not present

## 2020-02-03 DIAGNOSIS — H2513 Age-related nuclear cataract, bilateral: Secondary | ICD-10-CM | POA: Diagnosis not present

## 2020-02-03 DIAGNOSIS — M546 Pain in thoracic spine: Secondary | ICD-10-CM | POA: Diagnosis not present

## 2020-02-03 DIAGNOSIS — I429 Cardiomyopathy, unspecified: Secondary | ICD-10-CM | POA: Diagnosis not present

## 2020-02-03 DIAGNOSIS — R1013 Epigastric pain: Secondary | ICD-10-CM | POA: Diagnosis not present

## 2020-02-03 DIAGNOSIS — L84 Corns and callosities: Secondary | ICD-10-CM | POA: Diagnosis not present

## 2020-02-03 DIAGNOSIS — Z1389 Encounter for screening for other disorder: Secondary | ICD-10-CM | POA: Diagnosis not present

## 2020-02-03 DIAGNOSIS — I700 Atherosclerosis of aorta: Secondary | ICD-10-CM | POA: Diagnosis not present

## 2020-02-03 DIAGNOSIS — R6521 Severe sepsis with septic shock: Secondary | ICD-10-CM | POA: Diagnosis not present

## 2020-02-03 DIAGNOSIS — M75112 Incomplete rotator cuff tear or rupture of left shoulder, not specified as traumatic: Secondary | ICD-10-CM | POA: Diagnosis not present

## 2020-02-03 DIAGNOSIS — D3132 Benign neoplasm of left choroid: Secondary | ICD-10-CM | POA: Diagnosis not present

## 2020-02-03 DIAGNOSIS — M109 Gout, unspecified: Secondary | ICD-10-CM | POA: Diagnosis not present

## 2020-02-03 DIAGNOSIS — M25512 Pain in left shoulder: Secondary | ICD-10-CM | POA: Diagnosis not present

## 2020-02-03 DIAGNOSIS — Z8041 Family history of malignant neoplasm of ovary: Secondary | ICD-10-CM | POA: Diagnosis not present

## 2020-02-03 DIAGNOSIS — E1122 Type 2 diabetes mellitus with diabetic chronic kidney disease: Secondary | ICD-10-CM | POA: Diagnosis not present

## 2020-02-03 DIAGNOSIS — R251 Tremor, unspecified: Secondary | ICD-10-CM | POA: Diagnosis not present

## 2020-02-03 DIAGNOSIS — M25552 Pain in left hip: Secondary | ICD-10-CM | POA: Diagnosis not present

## 2020-02-03 DIAGNOSIS — K828 Other specified diseases of gallbladder: Secondary | ICD-10-CM | POA: Diagnosis not present

## 2020-02-03 DIAGNOSIS — I83018 Varicose veins of right lower extremity with ulcer other part of lower leg: Secondary | ICD-10-CM | POA: Diagnosis not present

## 2020-02-03 DIAGNOSIS — R3915 Urgency of urination: Secondary | ICD-10-CM | POA: Diagnosis not present

## 2020-02-04 DIAGNOSIS — G9009 Other idiopathic peripheral autonomic neuropathy: Secondary | ICD-10-CM | POA: Diagnosis not present

## 2020-02-04 DIAGNOSIS — L97521 Non-pressure chronic ulcer of other part of left foot limited to breakdown of skin: Secondary | ICD-10-CM | POA: Diagnosis not present

## 2020-02-04 DIAGNOSIS — E038 Other specified hypothyroidism: Secondary | ICD-10-CM | POA: Diagnosis not present

## 2020-02-04 DIAGNOSIS — Z952 Presence of prosthetic heart valve: Secondary | ICD-10-CM | POA: Diagnosis not present

## 2020-02-04 DIAGNOSIS — I371 Nonrheumatic pulmonary valve insufficiency: Secondary | ICD-10-CM | POA: Diagnosis not present

## 2020-02-04 DIAGNOSIS — B0221 Postherpetic geniculate ganglionitis: Secondary | ICD-10-CM | POA: Diagnosis not present

## 2020-02-04 DIAGNOSIS — Z713 Dietary counseling and surveillance: Secondary | ICD-10-CM | POA: Diagnosis not present

## 2020-02-04 DIAGNOSIS — D696 Thrombocytopenia, unspecified: Secondary | ICD-10-CM | POA: Diagnosis not present

## 2020-02-04 DIAGNOSIS — Z90722 Acquired absence of ovaries, bilateral: Secondary | ICD-10-CM | POA: Diagnosis not present

## 2020-02-04 DIAGNOSIS — M1990 Unspecified osteoarthritis, unspecified site: Secondary | ICD-10-CM | POA: Diagnosis not present

## 2020-02-04 DIAGNOSIS — E1159 Type 2 diabetes mellitus with other circulatory complications: Secondary | ICD-10-CM | POA: Diagnosis not present

## 2020-02-04 DIAGNOSIS — C7911 Secondary malignant neoplasm of bladder: Secondary | ICD-10-CM | POA: Diagnosis not present

## 2020-02-04 DIAGNOSIS — M79652 Pain in left thigh: Secondary | ICD-10-CM | POA: Diagnosis not present

## 2020-02-04 DIAGNOSIS — R799 Abnormal finding of blood chemistry, unspecified: Secondary | ICD-10-CM | POA: Diagnosis not present

## 2020-02-04 DIAGNOSIS — Z780 Asymptomatic menopausal state: Secondary | ICD-10-CM | POA: Diagnosis not present

## 2020-02-04 DIAGNOSIS — J431 Panlobular emphysema: Secondary | ICD-10-CM | POA: Diagnosis not present

## 2020-02-04 DIAGNOSIS — R569 Unspecified convulsions: Secondary | ICD-10-CM | POA: Diagnosis not present

## 2020-02-04 DIAGNOSIS — I951 Orthostatic hypotension: Secondary | ICD-10-CM | POA: Diagnosis not present

## 2020-02-04 DIAGNOSIS — H01029 Squamous blepharitis unspecified eye, unspecified eyelid: Secondary | ICD-10-CM | POA: Diagnosis not present

## 2020-02-04 DIAGNOSIS — I4891 Unspecified atrial fibrillation: Secondary | ICD-10-CM | POA: Diagnosis not present

## 2020-02-04 DIAGNOSIS — M810 Age-related osteoporosis without current pathological fracture: Secondary | ICD-10-CM | POA: Diagnosis not present

## 2020-02-04 DIAGNOSIS — F339 Major depressive disorder, recurrent, unspecified: Secondary | ICD-10-CM | POA: Diagnosis not present

## 2020-02-04 DIAGNOSIS — N184 Chronic kidney disease, stage 4 (severe): Secondary | ICD-10-CM | POA: Diagnosis not present

## 2020-02-04 DIAGNOSIS — I739 Peripheral vascular disease, unspecified: Secondary | ICD-10-CM | POA: Diagnosis not present

## 2020-02-04 DIAGNOSIS — Z8701 Personal history of pneumonia (recurrent): Secondary | ICD-10-CM | POA: Diagnosis not present

## 2020-02-04 DIAGNOSIS — Z791 Long term (current) use of non-steroidal anti-inflammatories (NSAID): Secondary | ICD-10-CM | POA: Diagnosis not present

## 2020-02-04 DIAGNOSIS — Z87448 Personal history of other diseases of urinary system: Secondary | ICD-10-CM | POA: Diagnosis not present

## 2020-02-04 DIAGNOSIS — Z9981 Dependence on supplemental oxygen: Secondary | ICD-10-CM | POA: Diagnosis not present

## 2020-02-04 DIAGNOSIS — R296 Repeated falls: Secondary | ICD-10-CM | POA: Diagnosis not present

## 2020-02-04 DIAGNOSIS — I351 Nonrheumatic aortic (valve) insufficiency: Secondary | ICD-10-CM | POA: Diagnosis not present

## 2020-02-04 DIAGNOSIS — C50212 Malignant neoplasm of upper-inner quadrant of left female breast: Secondary | ICD-10-CM | POA: Diagnosis not present

## 2020-02-04 DIAGNOSIS — R350 Frequency of micturition: Secondary | ICD-10-CM | POA: Diagnosis not present

## 2020-02-04 DIAGNOSIS — M4727 Other spondylosis with radiculopathy, lumbosacral region: Secondary | ICD-10-CM | POA: Diagnosis not present

## 2020-02-04 DIAGNOSIS — H04123 Dry eye syndrome of bilateral lacrimal glands: Secondary | ICD-10-CM | POA: Diagnosis not present

## 2020-02-04 DIAGNOSIS — R488 Other symbolic dysfunctions: Secondary | ICD-10-CM | POA: Diagnosis not present

## 2020-02-04 DIAGNOSIS — G96819 Other intracranial hypotension: Secondary | ICD-10-CM | POA: Diagnosis not present

## 2020-02-04 DIAGNOSIS — L821 Other seborrheic keratosis: Secondary | ICD-10-CM | POA: Diagnosis not present

## 2020-02-04 DIAGNOSIS — Z743 Need for continuous supervision: Secondary | ICD-10-CM | POA: Diagnosis not present

## 2020-02-04 DIAGNOSIS — Z789 Other specified health status: Secondary | ICD-10-CM | POA: Diagnosis not present

## 2020-02-04 DIAGNOSIS — M0530 Rheumatoid heart disease with rheumatoid arthritis of unspecified site: Secondary | ICD-10-CM | POA: Diagnosis not present

## 2020-02-04 DIAGNOSIS — F1010 Alcohol abuse, uncomplicated: Secondary | ICD-10-CM | POA: Diagnosis not present

## 2020-02-04 DIAGNOSIS — R410 Disorientation, unspecified: Secondary | ICD-10-CM | POA: Diagnosis not present

## 2020-02-04 DIAGNOSIS — D500 Iron deficiency anemia secondary to blood loss (chronic): Secondary | ICD-10-CM | POA: Diagnosis not present

## 2020-02-04 DIAGNOSIS — J984 Other disorders of lung: Secondary | ICD-10-CM | POA: Diagnosis not present

## 2020-02-04 DIAGNOSIS — M25552 Pain in left hip: Secondary | ICD-10-CM | POA: Diagnosis not present

## 2020-02-04 DIAGNOSIS — E7800 Pure hypercholesterolemia, unspecified: Secondary | ICD-10-CM | POA: Diagnosis not present

## 2020-02-04 DIAGNOSIS — I5023 Acute on chronic systolic (congestive) heart failure: Secondary | ICD-10-CM | POA: Diagnosis not present

## 2020-02-04 DIAGNOSIS — S32029D Unspecified fracture of second lumbar vertebra, subsequent encounter for fracture with routine healing: Secondary | ICD-10-CM | POA: Diagnosis not present

## 2020-02-04 DIAGNOSIS — N183 Chronic kidney disease, stage 3 unspecified: Secondary | ICD-10-CM | POA: Diagnosis not present

## 2020-02-04 DIAGNOSIS — C251 Malignant neoplasm of body of pancreas: Secondary | ICD-10-CM | POA: Diagnosis not present

## 2020-02-04 DIAGNOSIS — Z933 Colostomy status: Secondary | ICD-10-CM | POA: Diagnosis not present

## 2020-02-04 DIAGNOSIS — E782 Mixed hyperlipidemia: Secondary | ICD-10-CM | POA: Diagnosis not present

## 2020-02-04 DIAGNOSIS — E440 Moderate protein-calorie malnutrition: Secondary | ICD-10-CM | POA: Diagnosis not present

## 2020-02-04 DIAGNOSIS — Z23 Encounter for immunization: Secondary | ICD-10-CM | POA: Diagnosis not present

## 2020-02-04 DIAGNOSIS — G4700 Insomnia, unspecified: Secondary | ICD-10-CM | POA: Diagnosis not present

## 2020-02-04 DIAGNOSIS — B9689 Other specified bacterial agents as the cause of diseases classified elsewhere: Secondary | ICD-10-CM | POA: Diagnosis not present

## 2020-02-04 DIAGNOSIS — N3941 Urge incontinence: Secondary | ICD-10-CM | POA: Diagnosis not present

## 2020-02-04 DIAGNOSIS — Z79899 Other long term (current) drug therapy: Secondary | ICD-10-CM | POA: Diagnosis not present

## 2020-02-04 DIAGNOSIS — D638 Anemia in other chronic diseases classified elsewhere: Secondary | ICD-10-CM | POA: Diagnosis not present

## 2020-02-04 DIAGNOSIS — M48061 Spinal stenosis, lumbar region without neurogenic claudication: Secondary | ICD-10-CM | POA: Diagnosis not present

## 2020-02-04 DIAGNOSIS — R441 Visual hallucinations: Secondary | ICD-10-CM | POA: Diagnosis not present

## 2020-02-04 DIAGNOSIS — E559 Vitamin D deficiency, unspecified: Secondary | ICD-10-CM | POA: Diagnosis not present

## 2020-02-04 DIAGNOSIS — A4902 Methicillin resistant Staphylococcus aureus infection, unspecified site: Secondary | ICD-10-CM | POA: Diagnosis not present

## 2020-02-04 DIAGNOSIS — R2689 Other abnormalities of gait and mobility: Secondary | ICD-10-CM | POA: Diagnosis not present

## 2020-02-04 DIAGNOSIS — Z471 Aftercare following joint replacement surgery: Secondary | ICD-10-CM | POA: Diagnosis not present

## 2020-02-04 DIAGNOSIS — H9190 Unspecified hearing loss, unspecified ear: Secondary | ICD-10-CM | POA: Diagnosis not present

## 2020-02-04 DIAGNOSIS — B965 Pseudomonas (aeruginosa) (mallei) (pseudomallei) as the cause of diseases classified elsewhere: Secondary | ICD-10-CM | POA: Diagnosis not present

## 2020-02-04 DIAGNOSIS — M25652 Stiffness of left hip, not elsewhere classified: Secondary | ICD-10-CM | POA: Diagnosis not present

## 2020-02-04 DIAGNOSIS — K219 Gastro-esophageal reflux disease without esophagitis: Secondary | ICD-10-CM | POA: Diagnosis not present

## 2020-02-04 DIAGNOSIS — M069 Rheumatoid arthritis, unspecified: Secondary | ICD-10-CM | POA: Diagnosis not present

## 2020-02-04 DIAGNOSIS — J45909 Unspecified asthma, uncomplicated: Secondary | ICD-10-CM | POA: Diagnosis not present

## 2020-02-04 DIAGNOSIS — Z7409 Other reduced mobility: Secondary | ICD-10-CM | POA: Diagnosis not present

## 2020-02-04 DIAGNOSIS — R278 Other lack of coordination: Secondary | ICD-10-CM | POA: Diagnosis not present

## 2020-02-04 DIAGNOSIS — X32XXXD Exposure to sunlight, subsequent encounter: Secondary | ICD-10-CM | POA: Diagnosis not present

## 2020-02-04 DIAGNOSIS — N200 Calculus of kidney: Secondary | ICD-10-CM | POA: Diagnosis not present

## 2020-02-04 DIAGNOSIS — M79671 Pain in right foot: Secondary | ICD-10-CM | POA: Diagnosis not present

## 2020-02-04 DIAGNOSIS — R2681 Unsteadiness on feet: Secondary | ICD-10-CM | POA: Diagnosis not present

## 2020-02-04 DIAGNOSIS — M25662 Stiffness of left knee, not elsewhere classified: Secondary | ICD-10-CM | POA: Diagnosis not present

## 2020-02-04 DIAGNOSIS — Z951 Presence of aortocoronary bypass graft: Secondary | ICD-10-CM | POA: Diagnosis not present

## 2020-02-04 DIAGNOSIS — N9089 Other specified noninflammatory disorders of vulva and perineum: Secondary | ICD-10-CM | POA: Diagnosis not present

## 2020-02-04 DIAGNOSIS — Z7984 Long term (current) use of oral hypoglycemic drugs: Secondary | ICD-10-CM | POA: Diagnosis not present

## 2020-02-04 DIAGNOSIS — Z4781 Encounter for orthopedic aftercare following surgical amputation: Secondary | ICD-10-CM | POA: Diagnosis not present

## 2020-02-04 DIAGNOSIS — H16143 Punctate keratitis, bilateral: Secondary | ICD-10-CM | POA: Diagnosis not present

## 2020-02-04 DIAGNOSIS — M419 Scoliosis, unspecified: Secondary | ICD-10-CM | POA: Diagnosis not present

## 2020-02-04 DIAGNOSIS — M778 Other enthesopathies, not elsewhere classified: Secondary | ICD-10-CM | POA: Diagnosis not present

## 2020-02-04 DIAGNOSIS — R531 Weakness: Secondary | ICD-10-CM | POA: Diagnosis not present

## 2020-02-04 DIAGNOSIS — Z5111 Encounter for antineoplastic chemotherapy: Secondary | ICD-10-CM | POA: Diagnosis not present

## 2020-02-04 DIAGNOSIS — M5412 Radiculopathy, cervical region: Secondary | ICD-10-CM | POA: Diagnosis not present

## 2020-02-04 DIAGNOSIS — Z1159 Encounter for screening for other viral diseases: Secondary | ICD-10-CM | POA: Diagnosis not present

## 2020-02-04 DIAGNOSIS — M25461 Effusion, right knee: Secondary | ICD-10-CM | POA: Diagnosis not present

## 2020-02-04 DIAGNOSIS — N179 Acute kidney failure, unspecified: Secondary | ICD-10-CM | POA: Diagnosis not present

## 2020-02-04 DIAGNOSIS — M5127 Other intervertebral disc displacement, lumbosacral region: Secondary | ICD-10-CM | POA: Diagnosis not present

## 2020-02-04 DIAGNOSIS — Z7982 Long term (current) use of aspirin: Secondary | ICD-10-CM | POA: Diagnosis not present

## 2020-02-04 DIAGNOSIS — N926 Irregular menstruation, unspecified: Secondary | ICD-10-CM | POA: Diagnosis not present

## 2020-02-04 DIAGNOSIS — N321 Vesicointestinal fistula: Secondary | ICD-10-CM | POA: Diagnosis not present

## 2020-02-04 DIAGNOSIS — H2511 Age-related nuclear cataract, right eye: Secondary | ICD-10-CM | POA: Diagnosis not present

## 2020-02-04 DIAGNOSIS — E1142 Type 2 diabetes mellitus with diabetic polyneuropathy: Secondary | ICD-10-CM | POA: Diagnosis not present

## 2020-02-04 DIAGNOSIS — R29898 Other symptoms and signs involving the musculoskeletal system: Secondary | ICD-10-CM | POA: Diagnosis not present

## 2020-02-04 DIAGNOSIS — G5602 Carpal tunnel syndrome, left upper limb: Secondary | ICD-10-CM | POA: Diagnosis not present

## 2020-02-04 DIAGNOSIS — Z89411 Acquired absence of right great toe: Secondary | ICD-10-CM | POA: Diagnosis not present

## 2020-02-04 DIAGNOSIS — G4733 Obstructive sleep apnea (adult) (pediatric): Secondary | ICD-10-CM | POA: Diagnosis not present

## 2020-02-04 DIAGNOSIS — C189 Malignant neoplasm of colon, unspecified: Secondary | ICD-10-CM | POA: Diagnosis not present

## 2020-02-04 DIAGNOSIS — R319 Hematuria, unspecified: Secondary | ICD-10-CM | POA: Diagnosis not present

## 2020-02-04 DIAGNOSIS — N390 Urinary tract infection, site not specified: Secondary | ICD-10-CM | POA: Diagnosis not present

## 2020-02-04 DIAGNOSIS — J479 Bronchiectasis, uncomplicated: Secondary | ICD-10-CM | POA: Diagnosis not present

## 2020-02-04 DIAGNOSIS — N907 Vulvar cyst: Secondary | ICD-10-CM | POA: Diagnosis not present

## 2020-02-04 DIAGNOSIS — F419 Anxiety disorder, unspecified: Secondary | ICD-10-CM | POA: Diagnosis not present

## 2020-02-04 DIAGNOSIS — I350 Nonrheumatic aortic (valve) stenosis: Secondary | ICD-10-CM | POA: Diagnosis not present

## 2020-02-04 DIAGNOSIS — R262 Difficulty in walking, not elsewhere classified: Secondary | ICD-10-CM | POA: Diagnosis not present

## 2020-02-04 DIAGNOSIS — F3181 Bipolar II disorder: Secondary | ICD-10-CM | POA: Diagnosis not present

## 2020-02-04 DIAGNOSIS — I272 Pulmonary hypertension, unspecified: Secondary | ICD-10-CM | POA: Diagnosis not present

## 2020-02-04 DIAGNOSIS — F418 Other specified anxiety disorders: Secondary | ICD-10-CM | POA: Diagnosis not present

## 2020-02-04 DIAGNOSIS — J029 Acute pharyngitis, unspecified: Secondary | ICD-10-CM | POA: Diagnosis not present

## 2020-02-04 DIAGNOSIS — Z1211 Encounter for screening for malignant neoplasm of colon: Secondary | ICD-10-CM | POA: Diagnosis not present

## 2020-02-04 DIAGNOSIS — M542 Cervicalgia: Secondary | ICD-10-CM | POA: Diagnosis not present

## 2020-02-04 DIAGNOSIS — J9601 Acute respiratory failure with hypoxia: Secondary | ICD-10-CM | POA: Diagnosis not present

## 2020-02-04 DIAGNOSIS — E569 Vitamin deficiency, unspecified: Secondary | ICD-10-CM | POA: Diagnosis not present

## 2020-02-04 DIAGNOSIS — M5136 Other intervertebral disc degeneration, lumbar region: Secondary | ICD-10-CM | POA: Diagnosis not present

## 2020-02-04 DIAGNOSIS — N1832 Chronic kidney disease, stage 3b: Secondary | ICD-10-CM | POA: Diagnosis not present

## 2020-02-04 DIAGNOSIS — C4442 Squamous cell carcinoma of skin of scalp and neck: Secondary | ICD-10-CM | POA: Diagnosis not present

## 2020-02-04 DIAGNOSIS — H00025 Hordeolum internum left lower eyelid: Secondary | ICD-10-CM | POA: Diagnosis not present

## 2020-02-04 DIAGNOSIS — J441 Chronic obstructive pulmonary disease with (acute) exacerbation: Secondary | ICD-10-CM | POA: Diagnosis not present

## 2020-02-04 DIAGNOSIS — L03116 Cellulitis of left lower limb: Secondary | ICD-10-CM | POA: Diagnosis not present

## 2020-02-04 DIAGNOSIS — R2240 Localized swelling, mass and lump, unspecified lower limb: Secondary | ICD-10-CM | POA: Diagnosis not present

## 2020-02-04 DIAGNOSIS — R279 Unspecified lack of coordination: Secondary | ICD-10-CM | POA: Diagnosis not present

## 2020-02-04 DIAGNOSIS — R5381 Other malaise: Secondary | ICD-10-CM | POA: Diagnosis not present

## 2020-02-04 DIAGNOSIS — F3341 Major depressive disorder, recurrent, in partial remission: Secondary | ICD-10-CM | POA: Diagnosis not present

## 2020-02-04 DIAGNOSIS — M5442 Lumbago with sciatica, left side: Secondary | ICD-10-CM | POA: Diagnosis not present

## 2020-02-04 DIAGNOSIS — L2089 Other atopic dermatitis: Secondary | ICD-10-CM | POA: Diagnosis not present

## 2020-02-04 DIAGNOSIS — H7290 Unspecified perforation of tympanic membrane, unspecified ear: Secondary | ICD-10-CM | POA: Diagnosis not present

## 2020-02-04 DIAGNOSIS — M797 Fibromyalgia: Secondary | ICD-10-CM | POA: Diagnosis not present

## 2020-02-04 DIAGNOSIS — M7541 Impingement syndrome of right shoulder: Secondary | ICD-10-CM | POA: Diagnosis not present

## 2020-02-04 DIAGNOSIS — Z973 Presence of spectacles and contact lenses: Secondary | ICD-10-CM | POA: Diagnosis not present

## 2020-02-04 DIAGNOSIS — M25642 Stiffness of left hand, not elsewhere classified: Secondary | ICD-10-CM | POA: Diagnosis not present

## 2020-02-04 DIAGNOSIS — Z87891 Personal history of nicotine dependence: Secondary | ICD-10-CM | POA: Diagnosis not present

## 2020-02-04 DIAGNOSIS — F4320 Adjustment disorder, unspecified: Secondary | ICD-10-CM | POA: Diagnosis not present

## 2020-02-04 DIAGNOSIS — N433 Hydrocele, unspecified: Secondary | ICD-10-CM | POA: Diagnosis not present

## 2020-02-04 DIAGNOSIS — I5033 Acute on chronic diastolic (congestive) heart failure: Secondary | ICD-10-CM | POA: Diagnosis not present

## 2020-02-04 DIAGNOSIS — Z01810 Encounter for preprocedural cardiovascular examination: Secondary | ICD-10-CM | POA: Diagnosis not present

## 2020-02-04 DIAGNOSIS — I130 Hypertensive heart and chronic kidney disease with heart failure and stage 1 through stage 4 chronic kidney disease, or unspecified chronic kidney disease: Secondary | ICD-10-CM | POA: Diagnosis not present

## 2020-02-04 DIAGNOSIS — N289 Disorder of kidney and ureter, unspecified: Secondary | ICD-10-CM | POA: Diagnosis not present

## 2020-02-04 DIAGNOSIS — D509 Iron deficiency anemia, unspecified: Secondary | ICD-10-CM | POA: Diagnosis not present

## 2020-02-04 DIAGNOSIS — M0500 Felty's syndrome, unspecified site: Secondary | ICD-10-CM | POA: Diagnosis not present

## 2020-02-04 DIAGNOSIS — M25561 Pain in right knee: Secondary | ICD-10-CM | POA: Diagnosis not present

## 2020-02-04 DIAGNOSIS — E538 Deficiency of other specified B group vitamins: Secondary | ICD-10-CM | POA: Diagnosis not present

## 2020-02-04 DIAGNOSIS — M4712 Other spondylosis with myelopathy, cervical region: Secondary | ICD-10-CM | POA: Diagnosis not present

## 2020-02-04 DIAGNOSIS — Z79890 Hormone replacement therapy (postmenopausal): Secondary | ICD-10-CM | POA: Diagnosis not present

## 2020-02-04 DIAGNOSIS — Z6841 Body Mass Index (BMI) 40.0 and over, adult: Secondary | ICD-10-CM | POA: Diagnosis not present

## 2020-02-04 DIAGNOSIS — I129 Hypertensive chronic kidney disease with stage 1 through stage 4 chronic kidney disease, or unspecified chronic kidney disease: Secondary | ICD-10-CM | POA: Diagnosis not present

## 2020-02-04 DIAGNOSIS — I69398 Other sequelae of cerebral infarction: Secondary | ICD-10-CM | POA: Diagnosis not present

## 2020-02-04 DIAGNOSIS — E291 Testicular hypofunction: Secondary | ICD-10-CM | POA: Diagnosis not present

## 2020-02-04 DIAGNOSIS — M25512 Pain in left shoulder: Secondary | ICD-10-CM | POA: Diagnosis not present

## 2020-02-04 DIAGNOSIS — I639 Cerebral infarction, unspecified: Secondary | ICD-10-CM | POA: Diagnosis not present

## 2020-02-04 DIAGNOSIS — R8290 Unspecified abnormal findings in urine: Secondary | ICD-10-CM | POA: Diagnosis not present

## 2020-02-04 DIAGNOSIS — E1165 Type 2 diabetes mellitus with hyperglycemia: Secondary | ICD-10-CM | POA: Diagnosis not present

## 2020-02-04 DIAGNOSIS — C541 Malignant neoplasm of endometrium: Secondary | ICD-10-CM | POA: Diagnosis not present

## 2020-02-04 DIAGNOSIS — F0390 Unspecified dementia without behavioral disturbance: Secondary | ICD-10-CM | POA: Diagnosis not present

## 2020-02-04 DIAGNOSIS — M1711 Unilateral primary osteoarthritis, right knee: Secondary | ICD-10-CM | POA: Diagnosis not present

## 2020-02-04 DIAGNOSIS — I6621 Occlusion and stenosis of right posterior cerebral artery: Secondary | ICD-10-CM | POA: Diagnosis not present

## 2020-02-04 DIAGNOSIS — Z20822 Contact with and (suspected) exposure to covid-19: Secondary | ICD-10-CM | POA: Diagnosis not present

## 2020-02-04 DIAGNOSIS — L820 Inflamed seborrheic keratosis: Secondary | ICD-10-CM | POA: Diagnosis not present

## 2020-02-04 DIAGNOSIS — Z882 Allergy status to sulfonamides status: Secondary | ICD-10-CM | POA: Diagnosis not present

## 2020-02-04 DIAGNOSIS — I340 Nonrheumatic mitral (valve) insufficiency: Secondary | ICD-10-CM | POA: Diagnosis not present

## 2020-02-04 DIAGNOSIS — M8580 Other specified disorders of bone density and structure, unspecified site: Secondary | ICD-10-CM | POA: Diagnosis not present

## 2020-02-04 DIAGNOSIS — D519 Vitamin B12 deficiency anemia, unspecified: Secondary | ICD-10-CM | POA: Diagnosis not present

## 2020-02-04 DIAGNOSIS — E781 Pure hyperglyceridemia: Secondary | ICD-10-CM | POA: Diagnosis not present

## 2020-02-04 DIAGNOSIS — Z4802 Encounter for removal of sutures: Secondary | ICD-10-CM | POA: Diagnosis not present

## 2020-02-04 DIAGNOSIS — Z9049 Acquired absence of other specified parts of digestive tract: Secondary | ICD-10-CM | POA: Diagnosis not present

## 2020-02-04 DIAGNOSIS — H9313 Tinnitus, bilateral: Secondary | ICD-10-CM | POA: Diagnosis not present

## 2020-02-04 DIAGNOSIS — M170 Bilateral primary osteoarthritis of knee: Secondary | ICD-10-CM | POA: Diagnosis not present

## 2020-02-04 DIAGNOSIS — R0600 Dyspnea, unspecified: Secondary | ICD-10-CM | POA: Diagnosis not present

## 2020-02-04 DIAGNOSIS — D225 Melanocytic nevi of trunk: Secondary | ICD-10-CM | POA: Diagnosis not present

## 2020-02-04 DIAGNOSIS — D61818 Other pancytopenia: Secondary | ICD-10-CM | POA: Diagnosis not present

## 2020-02-04 DIAGNOSIS — C7951 Secondary malignant neoplasm of bone: Secondary | ICD-10-CM | POA: Diagnosis not present

## 2020-02-04 DIAGNOSIS — R42 Dizziness and giddiness: Secondary | ICD-10-CM | POA: Diagnosis not present

## 2020-02-04 DIAGNOSIS — Z125 Encounter for screening for malignant neoplasm of prostate: Secondary | ICD-10-CM | POA: Diagnosis not present

## 2020-02-04 DIAGNOSIS — I480 Paroxysmal atrial fibrillation: Secondary | ICD-10-CM | POA: Diagnosis not present

## 2020-02-04 DIAGNOSIS — M79605 Pain in left leg: Secondary | ICD-10-CM | POA: Diagnosis not present

## 2020-02-04 DIAGNOSIS — I348 Other nonrheumatic mitral valve disorders: Secondary | ICD-10-CM | POA: Diagnosis not present

## 2020-02-04 DIAGNOSIS — Z7902 Long term (current) use of antithrombotics/antiplatelets: Secondary | ICD-10-CM | POA: Diagnosis not present

## 2020-02-04 DIAGNOSIS — R1312 Dysphagia, oropharyngeal phase: Secondary | ICD-10-CM | POA: Diagnosis not present

## 2020-02-04 DIAGNOSIS — C44329 Squamous cell carcinoma of skin of other parts of face: Secondary | ICD-10-CM | POA: Diagnosis not present

## 2020-02-04 DIAGNOSIS — G629 Polyneuropathy, unspecified: Secondary | ICD-10-CM | POA: Diagnosis not present

## 2020-02-04 DIAGNOSIS — Z8719 Personal history of other diseases of the digestive system: Secondary | ICD-10-CM | POA: Diagnosis not present

## 2020-02-04 DIAGNOSIS — G3183 Dementia with Lewy bodies: Secondary | ICD-10-CM | POA: Diagnosis not present

## 2020-02-04 DIAGNOSIS — H9192 Unspecified hearing loss, left ear: Secondary | ICD-10-CM | POA: Diagnosis not present

## 2020-02-04 DIAGNOSIS — I6602 Occlusion and stenosis of left middle cerebral artery: Secondary | ICD-10-CM | POA: Diagnosis not present

## 2020-02-04 DIAGNOSIS — M545 Low back pain: Secondary | ICD-10-CM | POA: Diagnosis not present

## 2020-02-04 DIAGNOSIS — I2510 Atherosclerotic heart disease of native coronary artery without angina pectoris: Secondary | ICD-10-CM | POA: Diagnosis not present

## 2020-02-04 DIAGNOSIS — M86171 Other acute osteomyelitis, right ankle and foot: Secondary | ICD-10-CM | POA: Diagnosis not present

## 2020-02-04 DIAGNOSIS — U071 COVID-19: Secondary | ICD-10-CM | POA: Diagnosis not present

## 2020-02-04 DIAGNOSIS — E1169 Type 2 diabetes mellitus with other specified complication: Secondary | ICD-10-CM | POA: Diagnosis not present

## 2020-02-04 DIAGNOSIS — I5022 Chronic systolic (congestive) heart failure: Secondary | ICD-10-CM | POA: Diagnosis not present

## 2020-02-04 DIAGNOSIS — S32010D Wedge compression fracture of first lumbar vertebra, subsequent encounter for fracture with routine healing: Secondary | ICD-10-CM | POA: Diagnosis not present

## 2020-02-04 DIAGNOSIS — Z9181 History of falling: Secondary | ICD-10-CM | POA: Diagnosis not present

## 2020-02-04 DIAGNOSIS — Z950 Presence of cardiac pacemaker: Secondary | ICD-10-CM | POA: Diagnosis not present

## 2020-02-04 DIAGNOSIS — M62561 Muscle wasting and atrophy, not elsewhere classified, right lower leg: Secondary | ICD-10-CM | POA: Diagnosis not present

## 2020-02-04 DIAGNOSIS — Z96651 Presence of right artificial knee joint: Secondary | ICD-10-CM | POA: Diagnosis not present

## 2020-02-04 DIAGNOSIS — M25562 Pain in left knee: Secondary | ICD-10-CM | POA: Diagnosis not present

## 2020-02-04 DIAGNOSIS — R300 Dysuria: Secondary | ICD-10-CM | POA: Diagnosis not present

## 2020-02-04 DIAGNOSIS — L570 Actinic keratosis: Secondary | ICD-10-CM | POA: Diagnosis not present

## 2020-02-04 DIAGNOSIS — G43909 Migraine, unspecified, not intractable, without status migrainosus: Secondary | ICD-10-CM | POA: Diagnosis not present

## 2020-02-04 DIAGNOSIS — F320 Major depressive disorder, single episode, mild: Secondary | ICD-10-CM | POA: Diagnosis not present

## 2020-02-04 DIAGNOSIS — I10 Essential (primary) hypertension: Secondary | ICD-10-CM | POA: Diagnosis not present

## 2020-02-04 DIAGNOSIS — M21372 Foot drop, left foot: Secondary | ICD-10-CM | POA: Diagnosis not present

## 2020-02-04 DIAGNOSIS — Z8546 Personal history of malignant neoplasm of prostate: Secondary | ICD-10-CM | POA: Diagnosis not present

## 2020-02-04 DIAGNOSIS — R7881 Bacteremia: Secondary | ICD-10-CM | POA: Diagnosis not present

## 2020-02-04 DIAGNOSIS — E11621 Type 2 diabetes mellitus with foot ulcer: Secondary | ICD-10-CM | POA: Diagnosis not present

## 2020-02-04 DIAGNOSIS — M25531 Pain in right wrist: Secondary | ICD-10-CM | POA: Diagnosis not present

## 2020-02-04 DIAGNOSIS — H903 Sensorineural hearing loss, bilateral: Secondary | ICD-10-CM | POA: Diagnosis not present

## 2020-02-04 DIAGNOSIS — R399 Unspecified symptoms and signs involving the genitourinary system: Secondary | ICD-10-CM | POA: Diagnosis not present

## 2020-02-04 DIAGNOSIS — Z0001 Encounter for general adult medical examination with abnormal findings: Secondary | ICD-10-CM | POA: Diagnosis not present

## 2020-02-04 DIAGNOSIS — Z8673 Personal history of transient ischemic attack (TIA), and cerebral infarction without residual deficits: Secondary | ICD-10-CM | POA: Diagnosis not present

## 2020-02-04 DIAGNOSIS — R600 Localized edema: Secondary | ICD-10-CM | POA: Diagnosis not present

## 2020-02-04 DIAGNOSIS — I5032 Chronic diastolic (congestive) heart failure: Secondary | ICD-10-CM | POA: Diagnosis not present

## 2020-02-04 DIAGNOSIS — M62551 Muscle wasting and atrophy, not elsewhere classified, right thigh: Secondary | ICD-10-CM | POA: Diagnosis not present

## 2020-02-04 DIAGNOSIS — Z96652 Presence of left artificial knee joint: Secondary | ICD-10-CM | POA: Diagnosis not present

## 2020-02-04 DIAGNOSIS — J9611 Chronic respiratory failure with hypoxia: Secondary | ICD-10-CM | POA: Diagnosis not present

## 2020-02-04 DIAGNOSIS — H40059 Ocular hypertension, unspecified eye: Secondary | ICD-10-CM | POA: Diagnosis not present

## 2020-02-04 DIAGNOSIS — M4802 Spinal stenosis, cervical region: Secondary | ICD-10-CM | POA: Diagnosis not present

## 2020-02-04 DIAGNOSIS — Z955 Presence of coronary angioplasty implant and graft: Secondary | ICD-10-CM | POA: Diagnosis not present

## 2020-02-04 DIAGNOSIS — Z741 Need for assistance with personal care: Secondary | ICD-10-CM | POA: Diagnosis not present

## 2020-02-04 DIAGNOSIS — R351 Nocturia: Secondary | ICD-10-CM | POA: Diagnosis not present

## 2020-02-04 DIAGNOSIS — Z86010 Personal history of colonic polyps: Secondary | ICD-10-CM | POA: Diagnosis not present

## 2020-02-04 DIAGNOSIS — G609 Hereditary and idiopathic neuropathy, unspecified: Secondary | ICD-10-CM | POA: Diagnosis not present

## 2020-02-04 DIAGNOSIS — M5481 Occipital neuralgia: Secondary | ICD-10-CM | POA: Diagnosis not present

## 2020-02-04 DIAGNOSIS — J439 Emphysema, unspecified: Secondary | ICD-10-CM | POA: Diagnosis not present

## 2020-02-04 DIAGNOSIS — Z85828 Personal history of other malignant neoplasm of skin: Secondary | ICD-10-CM | POA: Diagnosis not present

## 2020-02-04 DIAGNOSIS — M5137 Other intervertebral disc degeneration, lumbosacral region: Secondary | ICD-10-CM | POA: Diagnosis not present

## 2020-02-04 DIAGNOSIS — D649 Anemia, unspecified: Secondary | ICD-10-CM | POA: Diagnosis not present

## 2020-02-04 DIAGNOSIS — M25612 Stiffness of left shoulder, not elsewhere classified: Secondary | ICD-10-CM | POA: Diagnosis not present

## 2020-02-04 DIAGNOSIS — H3530 Unspecified macular degeneration: Secondary | ICD-10-CM | POA: Diagnosis not present

## 2020-02-04 DIAGNOSIS — E1122 Type 2 diabetes mellitus with diabetic chronic kidney disease: Secondary | ICD-10-CM | POA: Diagnosis not present

## 2020-02-04 DIAGNOSIS — Z79891 Long term (current) use of opiate analgesic: Secondary | ICD-10-CM | POA: Diagnosis not present

## 2020-02-04 DIAGNOSIS — R079 Chest pain, unspecified: Secondary | ICD-10-CM | POA: Diagnosis not present

## 2020-02-04 DIAGNOSIS — F329 Major depressive disorder, single episode, unspecified: Secondary | ICD-10-CM | POA: Diagnosis not present

## 2020-02-04 DIAGNOSIS — L0201 Cutaneous abscess of face: Secondary | ICD-10-CM | POA: Diagnosis not present

## 2020-02-04 DIAGNOSIS — K2270 Barrett's esophagus without dysplasia: Secondary | ICD-10-CM | POA: Diagnosis not present

## 2020-02-04 DIAGNOSIS — R0602 Shortness of breath: Secondary | ICD-10-CM | POA: Diagnosis not present

## 2020-02-04 DIAGNOSIS — J189 Pneumonia, unspecified organism: Secondary | ICD-10-CM | POA: Diagnosis not present

## 2020-02-04 DIAGNOSIS — N401 Enlarged prostate with lower urinary tract symptoms: Secondary | ICD-10-CM | POA: Diagnosis not present

## 2020-02-04 DIAGNOSIS — Z888 Allergy status to other drugs, medicaments and biological substances status: Secondary | ICD-10-CM | POA: Diagnosis not present

## 2020-02-04 DIAGNOSIS — I428 Other cardiomyopathies: Secondary | ICD-10-CM | POA: Diagnosis not present

## 2020-02-04 DIAGNOSIS — M1991 Primary osteoarthritis, unspecified site: Secondary | ICD-10-CM | POA: Diagnosis not present

## 2020-02-04 DIAGNOSIS — N181 Chronic kidney disease, stage 1: Secondary | ICD-10-CM | POA: Diagnosis not present

## 2020-02-04 DIAGNOSIS — H409 Unspecified glaucoma: Secondary | ICD-10-CM | POA: Diagnosis not present

## 2020-02-04 DIAGNOSIS — E785 Hyperlipidemia, unspecified: Secondary | ICD-10-CM | POA: Diagnosis not present

## 2020-02-04 DIAGNOSIS — F0280 Dementia in other diseases classified elsewhere without behavioral disturbance: Secondary | ICD-10-CM | POA: Diagnosis not present

## 2020-02-04 DIAGNOSIS — M25532 Pain in left wrist: Secondary | ICD-10-CM | POA: Diagnosis not present

## 2020-02-04 DIAGNOSIS — Z853 Personal history of malignant neoplasm of breast: Secondary | ICD-10-CM | POA: Diagnosis not present

## 2020-02-04 DIAGNOSIS — L905 Scar conditions and fibrosis of skin: Secondary | ICD-10-CM | POA: Diagnosis not present

## 2020-02-04 DIAGNOSIS — R55 Syncope and collapse: Secondary | ICD-10-CM | POA: Diagnosis not present

## 2020-02-04 DIAGNOSIS — R339 Retention of urine, unspecified: Secondary | ICD-10-CM | POA: Diagnosis not present

## 2020-02-04 DIAGNOSIS — C155 Malignant neoplasm of lower third of esophagus: Secondary | ICD-10-CM | POA: Diagnosis not present

## 2020-02-04 DIAGNOSIS — B955 Unspecified streptococcus as the cause of diseases classified elsewhere: Secondary | ICD-10-CM | POA: Diagnosis not present

## 2020-02-04 DIAGNOSIS — Z0000 Encounter for general adult medical examination without abnormal findings: Secondary | ICD-10-CM | POA: Diagnosis not present

## 2020-02-04 DIAGNOSIS — I110 Hypertensive heart disease with heart failure: Secondary | ICD-10-CM | POA: Diagnosis not present

## 2020-02-04 DIAGNOSIS — Z7689 Persons encountering health services in other specified circumstances: Secondary | ICD-10-CM | POA: Diagnosis not present

## 2020-02-04 DIAGNOSIS — L270 Generalized skin eruption due to drugs and medicaments taken internally: Secondary | ICD-10-CM | POA: Diagnosis not present

## 2020-02-04 DIAGNOSIS — M6281 Muscle weakness (generalized): Secondary | ICD-10-CM | POA: Diagnosis not present

## 2020-02-04 DIAGNOSIS — Z90710 Acquired absence of both cervix and uterus: Secondary | ICD-10-CM | POA: Diagnosis not present

## 2020-02-04 DIAGNOSIS — D849 Immunodeficiency, unspecified: Secondary | ICD-10-CM | POA: Diagnosis not present

## 2020-02-04 DIAGNOSIS — E871 Hypo-osmolality and hyponatremia: Secondary | ICD-10-CM | POA: Diagnosis not present

## 2020-02-04 DIAGNOSIS — G8929 Other chronic pain: Secondary | ICD-10-CM | POA: Diagnosis not present

## 2020-02-04 DIAGNOSIS — Z794 Long term (current) use of insulin: Secondary | ICD-10-CM | POA: Diagnosis not present

## 2020-02-04 DIAGNOSIS — R1310 Dysphagia, unspecified: Secondary | ICD-10-CM | POA: Diagnosis not present

## 2020-02-04 DIAGNOSIS — Z96661 Presence of right artificial ankle joint: Secondary | ICD-10-CM | POA: Diagnosis not present

## 2020-02-04 DIAGNOSIS — K311 Adult hypertrophic pyloric stenosis: Secondary | ICD-10-CM | POA: Diagnosis not present

## 2020-02-04 DIAGNOSIS — Z20828 Contact with and (suspected) exposure to other viral communicable diseases: Secondary | ICD-10-CM | POA: Diagnosis not present

## 2020-02-04 DIAGNOSIS — E1140 Type 2 diabetes mellitus with diabetic neuropathy, unspecified: Secondary | ICD-10-CM | POA: Diagnosis not present

## 2020-02-04 DIAGNOSIS — G4752 REM sleep behavior disorder: Secondary | ICD-10-CM | POA: Diagnosis not present

## 2020-02-04 DIAGNOSIS — I4892 Unspecified atrial flutter: Secondary | ICD-10-CM | POA: Diagnosis not present

## 2020-02-04 DIAGNOSIS — E119 Type 2 diabetes mellitus without complications: Secondary | ICD-10-CM | POA: Diagnosis not present

## 2020-02-04 DIAGNOSIS — R1319 Other dysphagia: Secondary | ICD-10-CM | POA: Diagnosis not present

## 2020-02-04 DIAGNOSIS — R7303 Prediabetes: Secondary | ICD-10-CM | POA: Diagnosis not present

## 2020-02-04 DIAGNOSIS — I071 Rheumatic tricuspid insufficiency: Secondary | ICD-10-CM | POA: Diagnosis not present

## 2020-02-04 DIAGNOSIS — M25661 Stiffness of right knee, not elsewhere classified: Secondary | ICD-10-CM | POA: Diagnosis not present

## 2020-02-04 DIAGNOSIS — Z4800 Encounter for change or removal of nonsurgical wound dressing: Secondary | ICD-10-CM | POA: Diagnosis not present

## 2020-02-04 DIAGNOSIS — M25511 Pain in right shoulder: Secondary | ICD-10-CM | POA: Diagnosis not present

## 2020-02-04 DIAGNOSIS — Z7901 Long term (current) use of anticoagulants: Secondary | ICD-10-CM | POA: Diagnosis not present

## 2020-02-04 DIAGNOSIS — E7849 Other hyperlipidemia: Secondary | ICD-10-CM | POA: Diagnosis not present

## 2020-02-04 DIAGNOSIS — G301 Alzheimer's disease with late onset: Secondary | ICD-10-CM | POA: Diagnosis not present

## 2020-02-04 DIAGNOSIS — M4626 Osteomyelitis of vertebra, lumbar region: Secondary | ICD-10-CM | POA: Diagnosis not present

## 2020-02-04 DIAGNOSIS — K8020 Calculus of gallbladder without cholecystitis without obstruction: Secondary | ICD-10-CM | POA: Diagnosis not present

## 2020-02-04 DIAGNOSIS — E039 Hypothyroidism, unspecified: Secondary | ICD-10-CM | POA: Diagnosis not present

## 2020-02-04 DIAGNOSIS — M1611 Unilateral primary osteoarthritis, right hip: Secondary | ICD-10-CM | POA: Diagnosis not present

## 2020-02-04 DIAGNOSIS — K567 Ileus, unspecified: Secondary | ICD-10-CM | POA: Diagnosis not present

## 2020-02-04 DIAGNOSIS — Z4789 Encounter for other orthopedic aftercare: Secondary | ICD-10-CM | POA: Diagnosis not present

## 2020-02-04 DIAGNOSIS — M25571 Pain in right ankle and joints of right foot: Secondary | ICD-10-CM | POA: Diagnosis not present

## 2020-02-04 DIAGNOSIS — M5441 Lumbago with sciatica, right side: Secondary | ICD-10-CM | POA: Diagnosis not present

## 2020-02-04 DIAGNOSIS — E6609 Other obesity due to excess calories: Secondary | ICD-10-CM | POA: Diagnosis not present

## 2020-02-04 DIAGNOSIS — Z96642 Presence of left artificial hip joint: Secondary | ICD-10-CM | POA: Diagnosis not present

## 2020-02-04 DIAGNOSIS — F411 Generalized anxiety disorder: Secondary | ICD-10-CM | POA: Diagnosis not present

## 2020-02-04 DIAGNOSIS — R0789 Other chest pain: Secondary | ICD-10-CM | POA: Diagnosis not present

## 2020-02-04 DIAGNOSIS — L97519 Non-pressure chronic ulcer of other part of right foot with unspecified severity: Secondary | ICD-10-CM | POA: Diagnosis not present

## 2020-02-04 DIAGNOSIS — E859 Amyloidosis, unspecified: Secondary | ICD-10-CM | POA: Diagnosis not present

## 2020-02-04 DIAGNOSIS — J449 Chronic obstructive pulmonary disease, unspecified: Secondary | ICD-10-CM | POA: Diagnosis not present

## 2020-02-04 DIAGNOSIS — D485 Neoplasm of uncertain behavior of skin: Secondary | ICD-10-CM | POA: Diagnosis not present

## 2020-02-04 DIAGNOSIS — Z87820 Personal history of traumatic brain injury: Secondary | ICD-10-CM | POA: Diagnosis not present

## 2020-02-04 DIAGNOSIS — Z8659 Personal history of other mental and behavioral disorders: Secondary | ICD-10-CM | POA: Diagnosis not present

## 2020-02-04 DIAGNOSIS — I25119 Atherosclerotic heart disease of native coronary artery with unspecified angina pectoris: Secondary | ICD-10-CM | POA: Diagnosis not present

## 2020-02-04 DIAGNOSIS — Z01812 Encounter for preprocedural laboratory examination: Secondary | ICD-10-CM | POA: Diagnosis not present

## 2020-02-04 DIAGNOSIS — C61 Malignant neoplasm of prostate: Secondary | ICD-10-CM | POA: Diagnosis not present

## 2020-02-05 DIAGNOSIS — N400 Enlarged prostate without lower urinary tract symptoms: Secondary | ICD-10-CM | POA: Diagnosis not present

## 2020-02-05 DIAGNOSIS — Z7984 Long term (current) use of oral hypoglycemic drugs: Secondary | ICD-10-CM | POA: Diagnosis not present

## 2020-02-05 DIAGNOSIS — H2513 Age-related nuclear cataract, bilateral: Secondary | ICD-10-CM | POA: Diagnosis not present

## 2020-02-05 DIAGNOSIS — I5040 Unspecified combined systolic (congestive) and diastolic (congestive) heart failure: Secondary | ICD-10-CM | POA: Diagnosis not present

## 2020-02-05 DIAGNOSIS — E7800 Pure hypercholesterolemia, unspecified: Secondary | ICD-10-CM | POA: Diagnosis not present

## 2020-02-05 DIAGNOSIS — N401 Enlarged prostate with lower urinary tract symptoms: Secondary | ICD-10-CM | POA: Diagnosis not present

## 2020-02-05 DIAGNOSIS — R42 Dizziness and giddiness: Secondary | ICD-10-CM | POA: Diagnosis not present

## 2020-02-05 DIAGNOSIS — R0902 Hypoxemia: Secondary | ICD-10-CM | POA: Diagnosis not present

## 2020-02-05 DIAGNOSIS — E785 Hyperlipidemia, unspecified: Secondary | ICD-10-CM | POA: Diagnosis not present

## 2020-02-05 DIAGNOSIS — I4819 Other persistent atrial fibrillation: Secondary | ICD-10-CM | POA: Diagnosis not present

## 2020-02-05 DIAGNOSIS — G9341 Metabolic encephalopathy: Secondary | ICD-10-CM | POA: Diagnosis not present

## 2020-02-05 DIAGNOSIS — F320 Major depressive disorder, single episode, mild: Secondary | ICD-10-CM | POA: Diagnosis not present

## 2020-02-05 DIAGNOSIS — I739 Peripheral vascular disease, unspecified: Secondary | ICD-10-CM | POA: Diagnosis not present

## 2020-02-05 DIAGNOSIS — S42293D Other displaced fracture of upper end of unspecified humerus, subsequent encounter for fracture with routine healing: Secondary | ICD-10-CM | POA: Diagnosis not present

## 2020-02-05 DIAGNOSIS — I214 Non-ST elevation (NSTEMI) myocardial infarction: Secondary | ICD-10-CM | POA: Diagnosis not present

## 2020-02-05 DIAGNOSIS — M62838 Other muscle spasm: Secondary | ICD-10-CM | POA: Diagnosis not present

## 2020-02-05 DIAGNOSIS — Z7901 Long term (current) use of anticoagulants: Secondary | ICD-10-CM | POA: Diagnosis not present

## 2020-02-05 DIAGNOSIS — Z466 Encounter for fitting and adjustment of urinary device: Secondary | ICD-10-CM | POA: Diagnosis not present

## 2020-02-05 DIAGNOSIS — R296 Repeated falls: Secondary | ICD-10-CM | POA: Diagnosis not present

## 2020-02-05 DIAGNOSIS — K648 Other hemorrhoids: Secondary | ICD-10-CM | POA: Diagnosis not present

## 2020-02-05 DIAGNOSIS — I5023 Acute on chronic systolic (congestive) heart failure: Secondary | ICD-10-CM | POA: Diagnosis not present

## 2020-02-05 DIAGNOSIS — F419 Anxiety disorder, unspecified: Secondary | ICD-10-CM | POA: Diagnosis not present

## 2020-02-05 DIAGNOSIS — M86171 Other acute osteomyelitis, right ankle and foot: Secondary | ICD-10-CM | POA: Diagnosis not present

## 2020-02-05 DIAGNOSIS — T82855A Stenosis of coronary artery stent, initial encounter: Secondary | ICD-10-CM | POA: Diagnosis not present

## 2020-02-05 DIAGNOSIS — H40033 Anatomical narrow angle, bilateral: Secondary | ICD-10-CM | POA: Diagnosis not present

## 2020-02-05 DIAGNOSIS — N1832 Chronic kidney disease, stage 3b: Secondary | ICD-10-CM | POA: Diagnosis not present

## 2020-02-05 DIAGNOSIS — C50212 Malignant neoplasm of upper-inner quadrant of left female breast: Secondary | ICD-10-CM | POA: Diagnosis not present

## 2020-02-05 DIAGNOSIS — Z7902 Long term (current) use of antithrombotics/antiplatelets: Secondary | ICD-10-CM | POA: Diagnosis not present

## 2020-02-05 DIAGNOSIS — K611 Rectal abscess: Secondary | ICD-10-CM | POA: Diagnosis not present

## 2020-02-05 DIAGNOSIS — I129 Hypertensive chronic kidney disease with stage 1 through stage 4 chronic kidney disease, or unspecified chronic kidney disease: Secondary | ICD-10-CM | POA: Diagnosis not present

## 2020-02-05 DIAGNOSIS — D649 Anemia, unspecified: Secondary | ICD-10-CM | POA: Diagnosis not present

## 2020-02-05 DIAGNOSIS — F909 Attention-deficit hyperactivity disorder, unspecified type: Secondary | ICD-10-CM | POA: Diagnosis not present

## 2020-02-05 DIAGNOSIS — R079 Chest pain, unspecified: Secondary | ICD-10-CM | POA: Diagnosis not present

## 2020-02-05 DIAGNOSIS — R531 Weakness: Secondary | ICD-10-CM | POA: Diagnosis not present

## 2020-02-05 DIAGNOSIS — Z743 Need for continuous supervision: Secondary | ICD-10-CM | POA: Diagnosis not present

## 2020-02-05 DIAGNOSIS — E782 Mixed hyperlipidemia: Secondary | ICD-10-CM | POA: Diagnosis not present

## 2020-02-05 DIAGNOSIS — S0101XA Laceration without foreign body of scalp, initial encounter: Secondary | ICD-10-CM | POA: Diagnosis not present

## 2020-02-05 DIAGNOSIS — Z8546 Personal history of malignant neoplasm of prostate: Secondary | ICD-10-CM | POA: Diagnosis not present

## 2020-02-05 DIAGNOSIS — M25512 Pain in left shoulder: Secondary | ICD-10-CM | POA: Diagnosis not present

## 2020-02-05 DIAGNOSIS — I5189 Other ill-defined heart diseases: Secondary | ICD-10-CM | POA: Diagnosis not present

## 2020-02-05 DIAGNOSIS — I4892 Unspecified atrial flutter: Secondary | ICD-10-CM | POA: Diagnosis not present

## 2020-02-05 DIAGNOSIS — Z87891 Personal history of nicotine dependence: Secondary | ICD-10-CM | POA: Diagnosis not present

## 2020-02-05 DIAGNOSIS — H4612 Retrobulbar neuritis, left eye: Secondary | ICD-10-CM | POA: Diagnosis not present

## 2020-02-05 DIAGNOSIS — N184 Chronic kidney disease, stage 4 (severe): Secondary | ICD-10-CM | POA: Diagnosis not present

## 2020-02-05 DIAGNOSIS — I5032 Chronic diastolic (congestive) heart failure: Secondary | ICD-10-CM | POA: Diagnosis not present

## 2020-02-05 DIAGNOSIS — S0031XA Abrasion of nose, initial encounter: Secondary | ICD-10-CM | POA: Diagnosis not present

## 2020-02-05 DIAGNOSIS — I509 Heart failure, unspecified: Secondary | ICD-10-CM | POA: Diagnosis not present

## 2020-02-05 DIAGNOSIS — M21371 Foot drop, right foot: Secondary | ICD-10-CM | POA: Diagnosis not present

## 2020-02-05 DIAGNOSIS — D638 Anemia in other chronic diseases classified elsewhere: Secondary | ICD-10-CM | POA: Diagnosis not present

## 2020-02-05 DIAGNOSIS — R2242 Localized swelling, mass and lump, left lower limb: Secondary | ICD-10-CM | POA: Diagnosis not present

## 2020-02-05 DIAGNOSIS — Z905 Acquired absence of kidney: Secondary | ICD-10-CM | POA: Diagnosis not present

## 2020-02-05 DIAGNOSIS — Z9181 History of falling: Secondary | ICD-10-CM | POA: Diagnosis not present

## 2020-02-05 DIAGNOSIS — Z950 Presence of cardiac pacemaker: Secondary | ICD-10-CM | POA: Diagnosis not present

## 2020-02-05 DIAGNOSIS — R58 Hemorrhage, not elsewhere classified: Secondary | ICD-10-CM | POA: Diagnosis not present

## 2020-02-05 DIAGNOSIS — R338 Other retention of urine: Secondary | ICD-10-CM | POA: Diagnosis not present

## 2020-02-05 DIAGNOSIS — I10 Essential (primary) hypertension: Secondary | ICD-10-CM | POA: Diagnosis not present

## 2020-02-05 DIAGNOSIS — F17210 Nicotine dependence, cigarettes, uncomplicated: Secondary | ICD-10-CM | POA: Diagnosis not present

## 2020-02-05 DIAGNOSIS — E038 Other specified hypothyroidism: Secondary | ICD-10-CM | POA: Diagnosis not present

## 2020-02-05 DIAGNOSIS — I252 Old myocardial infarction: Secondary | ICD-10-CM | POA: Diagnosis not present

## 2020-02-05 DIAGNOSIS — I2510 Atherosclerotic heart disease of native coronary artery without angina pectoris: Secondary | ICD-10-CM | POA: Diagnosis not present

## 2020-02-05 DIAGNOSIS — J431 Panlobular emphysema: Secondary | ICD-10-CM | POA: Diagnosis not present

## 2020-02-05 DIAGNOSIS — M436 Torticollis: Secondary | ICD-10-CM | POA: Diagnosis not present

## 2020-02-05 DIAGNOSIS — Z882 Allergy status to sulfonamides status: Secondary | ICD-10-CM | POA: Diagnosis not present

## 2020-02-05 DIAGNOSIS — N179 Acute kidney failure, unspecified: Secondary | ICD-10-CM | POA: Diagnosis not present

## 2020-02-05 DIAGNOSIS — R2681 Unsteadiness on feet: Secondary | ICD-10-CM | POA: Diagnosis not present

## 2020-02-05 DIAGNOSIS — G250 Essential tremor: Secondary | ICD-10-CM | POA: Diagnosis not present

## 2020-02-05 DIAGNOSIS — K219 Gastro-esophageal reflux disease without esophagitis: Secondary | ICD-10-CM | POA: Diagnosis not present

## 2020-02-05 DIAGNOSIS — R41840 Attention and concentration deficit: Secondary | ICD-10-CM | POA: Diagnosis not present

## 2020-02-05 DIAGNOSIS — R41841 Cognitive communication deficit: Secondary | ICD-10-CM | POA: Diagnosis not present

## 2020-02-05 DIAGNOSIS — D500 Iron deficiency anemia secondary to blood loss (chronic): Secondary | ICD-10-CM | POA: Diagnosis not present

## 2020-02-05 DIAGNOSIS — I671 Cerebral aneurysm, nonruptured: Secondary | ICD-10-CM | POA: Diagnosis not present

## 2020-02-05 DIAGNOSIS — G894 Chronic pain syndrome: Secondary | ICD-10-CM | POA: Diagnosis not present

## 2020-02-05 DIAGNOSIS — Z85820 Personal history of malignant melanoma of skin: Secondary | ICD-10-CM | POA: Diagnosis not present

## 2020-02-05 DIAGNOSIS — E119 Type 2 diabetes mellitus without complications: Secondary | ICD-10-CM | POA: Diagnosis not present

## 2020-02-05 DIAGNOSIS — N390 Urinary tract infection, site not specified: Secondary | ICD-10-CM | POA: Diagnosis not present

## 2020-02-05 DIAGNOSIS — Z48812 Encounter for surgical aftercare following surgery on the circulatory system: Secondary | ICD-10-CM | POA: Diagnosis not present

## 2020-02-05 DIAGNOSIS — F329 Major depressive disorder, single episode, unspecified: Secondary | ICD-10-CM | POA: Diagnosis not present

## 2020-02-05 DIAGNOSIS — K567 Ileus, unspecified: Secondary | ICD-10-CM | POA: Diagnosis not present

## 2020-02-05 DIAGNOSIS — F0390 Unspecified dementia without behavioral disturbance: Secondary | ICD-10-CM | POA: Diagnosis not present

## 2020-02-05 DIAGNOSIS — M48061 Spinal stenosis, lumbar region without neurogenic claudication: Secondary | ICD-10-CM | POA: Diagnosis not present

## 2020-02-05 DIAGNOSIS — M6281 Muscle weakness (generalized): Secondary | ICD-10-CM | POA: Diagnosis not present

## 2020-02-05 DIAGNOSIS — R262 Difficulty in walking, not elsewhere classified: Secondary | ICD-10-CM | POA: Diagnosis not present

## 2020-02-05 DIAGNOSIS — Z79899 Other long term (current) drug therapy: Secondary | ICD-10-CM | POA: Diagnosis not present

## 2020-02-05 DIAGNOSIS — S41122A Laceration with foreign body of left upper arm, initial encounter: Secondary | ICD-10-CM | POA: Diagnosis not present

## 2020-02-05 DIAGNOSIS — I5022 Chronic systolic (congestive) heart failure: Secondary | ICD-10-CM | POA: Diagnosis not present

## 2020-02-05 DIAGNOSIS — S61411A Laceration without foreign body of right hand, initial encounter: Secondary | ICD-10-CM | POA: Diagnosis not present

## 2020-02-05 DIAGNOSIS — E1165 Type 2 diabetes mellitus with hyperglycemia: Secondary | ICD-10-CM | POA: Diagnosis not present

## 2020-02-05 DIAGNOSIS — Z7982 Long term (current) use of aspirin: Secondary | ICD-10-CM | POA: Diagnosis not present

## 2020-02-05 DIAGNOSIS — M1990 Unspecified osteoarthritis, unspecified site: Secondary | ICD-10-CM | POA: Diagnosis not present

## 2020-02-05 DIAGNOSIS — J986 Disorders of diaphragm: Secondary | ICD-10-CM | POA: Diagnosis not present

## 2020-02-05 DIAGNOSIS — C649 Malignant neoplasm of unspecified kidney, except renal pelvis: Secondary | ICD-10-CM | POA: Diagnosis not present

## 2020-02-05 DIAGNOSIS — J449 Chronic obstructive pulmonary disease, unspecified: Secondary | ICD-10-CM | POA: Diagnosis not present

## 2020-02-05 DIAGNOSIS — F1010 Alcohol abuse, uncomplicated: Secondary | ICD-10-CM | POA: Diagnosis not present

## 2020-02-05 DIAGNOSIS — I5042 Chronic combined systolic (congestive) and diastolic (congestive) heart failure: Secondary | ICD-10-CM | POA: Diagnosis not present

## 2020-02-05 DIAGNOSIS — I4811 Longstanding persistent atrial fibrillation: Secondary | ICD-10-CM | POA: Diagnosis not present

## 2020-02-05 DIAGNOSIS — Z4731 Aftercare following explantation of shoulder joint prosthesis: Secondary | ICD-10-CM | POA: Diagnosis not present

## 2020-02-05 DIAGNOSIS — M79605 Pain in left leg: Secondary | ICD-10-CM | POA: Diagnosis not present

## 2020-02-05 DIAGNOSIS — Z20822 Contact with and (suspected) exposure to covid-19: Secondary | ICD-10-CM | POA: Diagnosis not present

## 2020-02-05 DIAGNOSIS — R4701 Aphasia: Secondary | ICD-10-CM | POA: Diagnosis not present

## 2020-02-05 DIAGNOSIS — M797 Fibromyalgia: Secondary | ICD-10-CM | POA: Diagnosis not present

## 2020-02-06 DIAGNOSIS — S68121A Partial traumatic metacarpophalangeal amputation of left index finger, initial encounter: Secondary | ICD-10-CM | POA: Diagnosis not present

## 2020-02-06 DIAGNOSIS — B9620 Unspecified Escherichia coli [E. coli] as the cause of diseases classified elsewhere: Secondary | ICD-10-CM | POA: Diagnosis not present

## 2020-02-06 DIAGNOSIS — L03115 Cellulitis of right lower limb: Secondary | ICD-10-CM | POA: Diagnosis not present

## 2020-02-06 DIAGNOSIS — Z888 Allergy status to other drugs, medicaments and biological substances status: Secondary | ICD-10-CM | POA: Diagnosis not present

## 2020-02-06 DIAGNOSIS — R918 Other nonspecific abnormal finding of lung field: Secondary | ICD-10-CM | POA: Diagnosis not present

## 2020-02-06 DIAGNOSIS — E119 Type 2 diabetes mellitus without complications: Secondary | ICD-10-CM | POA: Diagnosis not present

## 2020-02-06 DIAGNOSIS — R42 Dizziness and giddiness: Secondary | ICD-10-CM | POA: Diagnosis not present

## 2020-02-06 DIAGNOSIS — I120 Hypertensive chronic kidney disease with stage 5 chronic kidney disease or end stage renal disease: Secondary | ICD-10-CM | POA: Diagnosis not present

## 2020-02-06 DIAGNOSIS — Z4801 Encounter for change or removal of surgical wound dressing: Secondary | ICD-10-CM | POA: Diagnosis not present

## 2020-02-06 DIAGNOSIS — I2510 Atherosclerotic heart disease of native coronary artery without angina pectoris: Secondary | ICD-10-CM | POA: Diagnosis not present

## 2020-02-06 DIAGNOSIS — J1282 Pneumonia due to coronavirus disease 2019: Secondary | ICD-10-CM | POA: Diagnosis not present

## 2020-02-06 DIAGNOSIS — I6529 Occlusion and stenosis of unspecified carotid artery: Secondary | ICD-10-CM | POA: Diagnosis not present

## 2020-02-06 DIAGNOSIS — J9601 Acute respiratory failure with hypoxia: Secondary | ICD-10-CM | POA: Diagnosis not present

## 2020-02-06 DIAGNOSIS — N2581 Secondary hyperparathyroidism of renal origin: Secondary | ICD-10-CM | POA: Diagnosis not present

## 2020-02-06 DIAGNOSIS — Z880 Allergy status to penicillin: Secondary | ICD-10-CM | POA: Diagnosis not present

## 2020-02-06 DIAGNOSIS — Z20828 Contact with and (suspected) exposure to other viral communicable diseases: Secondary | ICD-10-CM | POA: Diagnosis not present

## 2020-02-06 DIAGNOSIS — E1122 Type 2 diabetes mellitus with diabetic chronic kidney disease: Secondary | ICD-10-CM | POA: Diagnosis not present

## 2020-02-06 DIAGNOSIS — R519 Headache, unspecified: Secondary | ICD-10-CM | POA: Diagnosis not present

## 2020-02-06 DIAGNOSIS — D631 Anemia in chronic kidney disease: Secondary | ICD-10-CM | POA: Diagnosis not present

## 2020-02-06 DIAGNOSIS — J449 Chronic obstructive pulmonary disease, unspecified: Secondary | ICD-10-CM | POA: Diagnosis not present

## 2020-02-06 DIAGNOSIS — R69 Illness, unspecified: Secondary | ICD-10-CM | POA: Diagnosis not present

## 2020-02-06 DIAGNOSIS — Z800 Family history of malignant neoplasm of digestive organs: Secondary | ICD-10-CM | POA: Diagnosis not present

## 2020-02-06 DIAGNOSIS — Z79899 Other long term (current) drug therapy: Secondary | ICD-10-CM | POA: Diagnosis not present

## 2020-02-06 DIAGNOSIS — Z20822 Contact with and (suspected) exposure to covid-19: Secondary | ICD-10-CM | POA: Diagnosis not present

## 2020-02-06 DIAGNOSIS — I951 Orthostatic hypotension: Secondary | ICD-10-CM | POA: Diagnosis not present

## 2020-02-06 DIAGNOSIS — K2970 Gastritis, unspecified, without bleeding: Secondary | ICD-10-CM | POA: Diagnosis not present

## 2020-02-06 DIAGNOSIS — S52602A Unspecified fracture of lower end of left ulna, initial encounter for closed fracture: Secondary | ICD-10-CM | POA: Diagnosis not present

## 2020-02-06 DIAGNOSIS — Z886 Allergy status to analgesic agent status: Secondary | ICD-10-CM | POA: Diagnosis not present

## 2020-02-06 DIAGNOSIS — Z03818 Encounter for observation for suspected exposure to other biological agents ruled out: Secondary | ICD-10-CM | POA: Diagnosis not present

## 2020-02-06 DIAGNOSIS — R410 Disorientation, unspecified: Secondary | ICD-10-CM | POA: Diagnosis not present

## 2020-02-06 DIAGNOSIS — E871 Hypo-osmolality and hyponatremia: Secondary | ICD-10-CM | POA: Diagnosis not present

## 2020-02-06 DIAGNOSIS — I509 Heart failure, unspecified: Secondary | ICD-10-CM | POA: Diagnosis not present

## 2020-02-06 DIAGNOSIS — D72829 Elevated white blood cell count, unspecified: Secondary | ICD-10-CM | POA: Diagnosis not present

## 2020-02-06 DIAGNOSIS — K429 Umbilical hernia without obstruction or gangrene: Secondary | ICD-10-CM | POA: Diagnosis not present

## 2020-02-06 DIAGNOSIS — R911 Solitary pulmonary nodule: Secondary | ICD-10-CM | POA: Diagnosis not present

## 2020-02-06 DIAGNOSIS — G92 Toxic encephalopathy: Secondary | ICD-10-CM | POA: Diagnosis not present

## 2020-02-06 DIAGNOSIS — G20 Parkinson's disease: Secondary | ICD-10-CM | POA: Diagnosis not present

## 2020-02-06 DIAGNOSIS — J984 Other disorders of lung: Secondary | ICD-10-CM | POA: Diagnosis not present

## 2020-02-06 DIAGNOSIS — Z885 Allergy status to narcotic agent status: Secondary | ICD-10-CM | POA: Diagnosis not present

## 2020-02-06 DIAGNOSIS — I480 Paroxysmal atrial fibrillation: Secondary | ICD-10-CM | POA: Diagnosis not present

## 2020-02-06 DIAGNOSIS — Z89512 Acquired absence of left leg below knee: Secondary | ICD-10-CM | POA: Diagnosis not present

## 2020-02-06 DIAGNOSIS — T17590A Other foreign object in bronchus causing asphyxiation, initial encounter: Secondary | ICD-10-CM | POA: Diagnosis not present

## 2020-02-06 DIAGNOSIS — E785 Hyperlipidemia, unspecified: Secondary | ICD-10-CM | POA: Diagnosis not present

## 2020-02-06 DIAGNOSIS — Z66 Do not resuscitate: Secondary | ICD-10-CM | POA: Diagnosis not present

## 2020-02-06 DIAGNOSIS — E440 Moderate protein-calorie malnutrition: Secondary | ICD-10-CM | POA: Diagnosis not present

## 2020-02-06 DIAGNOSIS — I288 Other diseases of pulmonary vessels: Secondary | ICD-10-CM | POA: Diagnosis not present

## 2020-02-06 DIAGNOSIS — I5022 Chronic systolic (congestive) heart failure: Secondary | ICD-10-CM | POA: Diagnosis not present

## 2020-02-06 DIAGNOSIS — S52502A Unspecified fracture of the lower end of left radius, initial encounter for closed fracture: Secondary | ICD-10-CM | POA: Diagnosis not present

## 2020-02-06 DIAGNOSIS — U071 COVID-19: Secondary | ICD-10-CM | POA: Diagnosis not present

## 2020-02-06 DIAGNOSIS — L97919 Non-pressure chronic ulcer of unspecified part of right lower leg with unspecified severity: Secondary | ICD-10-CM | POA: Diagnosis not present

## 2020-02-06 DIAGNOSIS — J9811 Atelectasis: Secondary | ICD-10-CM | POA: Diagnosis not present

## 2020-02-06 DIAGNOSIS — F419 Anxiety disorder, unspecified: Secondary | ICD-10-CM | POA: Diagnosis not present

## 2020-02-06 DIAGNOSIS — I739 Peripheral vascular disease, unspecified: Secondary | ICD-10-CM | POA: Diagnosis not present

## 2020-02-06 DIAGNOSIS — S52611A Displaced fracture of right ulna styloid process, initial encounter for closed fracture: Secondary | ICD-10-CM | POA: Diagnosis not present

## 2020-02-06 DIAGNOSIS — I4892 Unspecified atrial flutter: Secondary | ICD-10-CM | POA: Diagnosis not present

## 2020-02-06 DIAGNOSIS — M6281 Muscle weakness (generalized): Secondary | ICD-10-CM | POA: Diagnosis not present

## 2020-02-06 DIAGNOSIS — R0602 Shortness of breath: Secondary | ICD-10-CM | POA: Diagnosis not present

## 2020-02-06 DIAGNOSIS — E876 Hypokalemia: Secondary | ICD-10-CM | POA: Diagnosis not present

## 2020-02-06 DIAGNOSIS — N390 Urinary tract infection, site not specified: Secondary | ICD-10-CM | POA: Diagnosis not present

## 2020-02-06 DIAGNOSIS — Z8049 Family history of malignant neoplasm of other genital organs: Secondary | ICD-10-CM | POA: Diagnosis not present

## 2020-02-06 DIAGNOSIS — I130 Hypertensive heart and chronic kidney disease with heart failure and stage 1 through stage 4 chronic kidney disease, or unspecified chronic kidney disease: Secondary | ICD-10-CM | POA: Diagnosis not present

## 2020-02-06 DIAGNOSIS — Z87891 Personal history of nicotine dependence: Secondary | ICD-10-CM | POA: Diagnosis not present

## 2020-02-06 DIAGNOSIS — Z8542 Personal history of malignant neoplasm of other parts of uterus: Secondary | ICD-10-CM | POA: Diagnosis not present

## 2020-02-06 DIAGNOSIS — A4101 Sepsis due to Methicillin susceptible Staphylococcus aureus: Secondary | ICD-10-CM | POA: Diagnosis not present

## 2020-02-06 DIAGNOSIS — S52501A Unspecified fracture of the lower end of right radius, initial encounter for closed fracture: Secondary | ICD-10-CM | POA: Diagnosis not present

## 2020-02-06 DIAGNOSIS — Z85820 Personal history of malignant melanoma of skin: Secondary | ICD-10-CM | POA: Diagnosis not present

## 2020-02-06 DIAGNOSIS — J690 Pneumonitis due to inhalation of food and vomit: Secondary | ICD-10-CM | POA: Diagnosis not present

## 2020-02-06 DIAGNOSIS — K5900 Constipation, unspecified: Secondary | ICD-10-CM | POA: Diagnosis not present

## 2020-02-06 DIAGNOSIS — K219 Gastro-esophageal reflux disease without esophagitis: Secondary | ICD-10-CM | POA: Diagnosis not present

## 2020-02-06 DIAGNOSIS — J189 Pneumonia, unspecified organism: Secondary | ICD-10-CM | POA: Diagnosis not present

## 2020-02-06 DIAGNOSIS — J9809 Other diseases of bronchus, not elsewhere classified: Secondary | ICD-10-CM | POA: Diagnosis not present

## 2020-02-06 DIAGNOSIS — I160 Hypertensive urgency: Secondary | ICD-10-CM | POA: Diagnosis not present

## 2020-02-06 DIAGNOSIS — Z6821 Body mass index (BMI) 21.0-21.9, adult: Secondary | ICD-10-CM | POA: Diagnosis not present

## 2020-02-06 DIAGNOSIS — E1151 Type 2 diabetes mellitus with diabetic peripheral angiopathy without gangrene: Secondary | ICD-10-CM | POA: Diagnosis not present

## 2020-02-06 DIAGNOSIS — Z4781 Encounter for orthopedic aftercare following surgical amputation: Secondary | ICD-10-CM | POA: Diagnosis not present

## 2020-02-06 DIAGNOSIS — R109 Unspecified abdominal pain: Secondary | ICD-10-CM | POA: Diagnosis not present

## 2020-02-06 DIAGNOSIS — Z86018 Personal history of other benign neoplasm: Secondary | ICD-10-CM | POA: Diagnosis not present

## 2020-02-06 DIAGNOSIS — N186 End stage renal disease: Secondary | ICD-10-CM | POA: Diagnosis not present

## 2020-02-06 DIAGNOSIS — M86171 Other acute osteomyelitis, right ankle and foot: Secondary | ICD-10-CM | POA: Diagnosis not present

## 2020-02-06 DIAGNOSIS — D649 Anemia, unspecified: Secondary | ICD-10-CM | POA: Diagnosis not present

## 2020-02-06 DIAGNOSIS — E109 Type 1 diabetes mellitus without complications: Secondary | ICD-10-CM | POA: Diagnosis not present

## 2020-02-06 DIAGNOSIS — N1831 Chronic kidney disease, stage 3a: Secondary | ICD-10-CM | POA: Diagnosis not present

## 2020-02-06 DIAGNOSIS — F17210 Nicotine dependence, cigarettes, uncomplicated: Secondary | ICD-10-CM | POA: Diagnosis not present

## 2020-02-06 DIAGNOSIS — R509 Fever, unspecified: Secondary | ICD-10-CM | POA: Diagnosis not present

## 2020-02-06 DIAGNOSIS — I10 Essential (primary) hypertension: Secondary | ICD-10-CM | POA: Diagnosis not present

## 2020-02-06 DIAGNOSIS — E782 Mixed hyperlipidemia: Secondary | ICD-10-CM | POA: Diagnosis not present

## 2020-02-06 DIAGNOSIS — R748 Abnormal levels of other serum enzymes: Secondary | ICD-10-CM | POA: Diagnosis not present

## 2020-02-06 DIAGNOSIS — D638 Anemia in other chronic diseases classified elsewhere: Secondary | ICD-10-CM | POA: Diagnosis not present

## 2020-02-06 DIAGNOSIS — G40909 Epilepsy, unspecified, not intractable, without status epilepticus: Secondary | ICD-10-CM | POA: Diagnosis not present

## 2020-02-06 DIAGNOSIS — G4733 Obstructive sleep apnea (adult) (pediatric): Secondary | ICD-10-CM | POA: Diagnosis not present

## 2020-02-06 DIAGNOSIS — F05 Delirium due to known physiological condition: Secondary | ICD-10-CM | POA: Diagnosis not present

## 2020-02-07 DIAGNOSIS — I493 Ventricular premature depolarization: Secondary | ICD-10-CM | POA: Diagnosis not present

## 2020-02-07 DIAGNOSIS — Z96651 Presence of right artificial knee joint: Secondary | ICD-10-CM | POA: Diagnosis not present

## 2020-02-07 DIAGNOSIS — G893 Neoplasm related pain (acute) (chronic): Secondary | ICD-10-CM | POA: Diagnosis not present

## 2020-02-07 DIAGNOSIS — H43813 Vitreous degeneration, bilateral: Secondary | ICD-10-CM | POA: Diagnosis not present

## 2020-02-07 DIAGNOSIS — D225 Melanocytic nevi of trunk: Secondary | ICD-10-CM | POA: Diagnosis not present

## 2020-02-07 DIAGNOSIS — R9431 Abnormal electrocardiogram [ECG] [EKG]: Secondary | ICD-10-CM | POA: Diagnosis not present

## 2020-02-07 DIAGNOSIS — M8088XD Other osteoporosis with current pathological fracture, vertebra(e), subsequent encounter for fracture with routine healing: Secondary | ICD-10-CM | POA: Diagnosis not present

## 2020-02-07 DIAGNOSIS — M1732 Unilateral post-traumatic osteoarthritis, left knee: Secondary | ICD-10-CM | POA: Diagnosis not present

## 2020-02-07 DIAGNOSIS — M9903 Segmental and somatic dysfunction of lumbar region: Secondary | ICD-10-CM | POA: Diagnosis not present

## 2020-02-07 DIAGNOSIS — M5136 Other intervertebral disc degeneration, lumbar region: Secondary | ICD-10-CM | POA: Diagnosis not present

## 2020-02-07 DIAGNOSIS — N1832 Chronic kidney disease, stage 3b: Secondary | ICD-10-CM | POA: Diagnosis not present

## 2020-02-07 DIAGNOSIS — M7061 Trochanteric bursitis, right hip: Secondary | ICD-10-CM | POA: Diagnosis not present

## 2020-02-07 DIAGNOSIS — Z96652 Presence of left artificial knee joint: Secondary | ICD-10-CM | POA: Diagnosis not present

## 2020-02-07 DIAGNOSIS — Z794 Long term (current) use of insulin: Secondary | ICD-10-CM | POA: Diagnosis not present

## 2020-02-07 DIAGNOSIS — R6889 Other general symptoms and signs: Secondary | ICD-10-CM | POA: Diagnosis not present

## 2020-02-07 DIAGNOSIS — K580 Irritable bowel syndrome with diarrhea: Secondary | ICD-10-CM | POA: Diagnosis not present

## 2020-02-07 DIAGNOSIS — E785 Hyperlipidemia, unspecified: Secondary | ICD-10-CM | POA: Diagnosis not present

## 2020-02-07 DIAGNOSIS — I499 Cardiac arrhythmia, unspecified: Secondary | ICD-10-CM | POA: Diagnosis not present

## 2020-02-07 DIAGNOSIS — J432 Centrilobular emphysema: Secondary | ICD-10-CM | POA: Diagnosis not present

## 2020-02-07 DIAGNOSIS — K2970 Gastritis, unspecified, without bleeding: Secondary | ICD-10-CM | POA: Diagnosis not present

## 2020-02-07 DIAGNOSIS — Z79899 Other long term (current) drug therapy: Secondary | ICD-10-CM | POA: Diagnosis not present

## 2020-02-07 DIAGNOSIS — Z66 Do not resuscitate: Secondary | ICD-10-CM | POA: Diagnosis not present

## 2020-02-07 DIAGNOSIS — I700 Atherosclerosis of aorta: Secondary | ICD-10-CM | POA: Diagnosis not present

## 2020-02-07 DIAGNOSIS — M129 Arthropathy, unspecified: Secondary | ICD-10-CM | POA: Diagnosis not present

## 2020-02-07 DIAGNOSIS — H5461 Unqualified visual loss, right eye, normal vision left eye: Secondary | ICD-10-CM | POA: Diagnosis not present

## 2020-02-07 DIAGNOSIS — Z4789 Encounter for other orthopedic aftercare: Secondary | ICD-10-CM | POA: Diagnosis not present

## 2020-02-07 DIAGNOSIS — Z79811 Long term (current) use of aromatase inhibitors: Secondary | ICD-10-CM | POA: Diagnosis not present

## 2020-02-07 DIAGNOSIS — R972 Elevated prostate specific antigen [PSA]: Secondary | ICD-10-CM | POA: Diagnosis not present

## 2020-02-07 DIAGNOSIS — K449 Diaphragmatic hernia without obstruction or gangrene: Secondary | ICD-10-CM | POA: Diagnosis not present

## 2020-02-07 DIAGNOSIS — C7951 Secondary malignant neoplasm of bone: Secondary | ICD-10-CM | POA: Diagnosis not present

## 2020-02-07 DIAGNOSIS — Z9013 Acquired absence of bilateral breasts and nipples: Secondary | ICD-10-CM | POA: Diagnosis not present

## 2020-02-07 DIAGNOSIS — I130 Hypertensive heart and chronic kidney disease with heart failure and stage 1 through stage 4 chronic kidney disease, or unspecified chronic kidney disease: Secondary | ICD-10-CM | POA: Diagnosis not present

## 2020-02-07 DIAGNOSIS — M539 Dorsopathy, unspecified: Secondary | ICD-10-CM | POA: Diagnosis not present

## 2020-02-07 DIAGNOSIS — Z85038 Personal history of other malignant neoplasm of large intestine: Secondary | ICD-10-CM | POA: Diagnosis not present

## 2020-02-07 DIAGNOSIS — I340 Nonrheumatic mitral (valve) insufficiency: Secondary | ICD-10-CM | POA: Diagnosis not present

## 2020-02-07 DIAGNOSIS — G40909 Epilepsy, unspecified, not intractable, without status epilepticus: Secondary | ICD-10-CM | POA: Diagnosis not present

## 2020-02-07 DIAGNOSIS — I480 Paroxysmal atrial fibrillation: Secondary | ICD-10-CM | POA: Diagnosis not present

## 2020-02-07 DIAGNOSIS — F0390 Unspecified dementia without behavioral disturbance: Secondary | ICD-10-CM | POA: Diagnosis not present

## 2020-02-07 DIAGNOSIS — Z7902 Long term (current) use of antithrombotics/antiplatelets: Secondary | ICD-10-CM | POA: Diagnosis not present

## 2020-02-07 DIAGNOSIS — E119 Type 2 diabetes mellitus without complications: Secondary | ICD-10-CM | POA: Diagnosis not present

## 2020-02-07 DIAGNOSIS — I96 Gangrene, not elsewhere classified: Secondary | ICD-10-CM | POA: Diagnosis not present

## 2020-02-07 DIAGNOSIS — R112 Nausea with vomiting, unspecified: Secondary | ICD-10-CM | POA: Diagnosis not present

## 2020-02-07 DIAGNOSIS — M9904 Segmental and somatic dysfunction of sacral region: Secondary | ICD-10-CM | POA: Diagnosis not present

## 2020-02-07 DIAGNOSIS — N2581 Secondary hyperparathyroidism of renal origin: Secondary | ICD-10-CM | POA: Diagnosis not present

## 2020-02-07 DIAGNOSIS — Z5329 Procedure and treatment not carried out because of patient's decision for other reasons: Secondary | ICD-10-CM | POA: Diagnosis not present

## 2020-02-07 DIAGNOSIS — M546 Pain in thoracic spine: Secondary | ICD-10-CM | POA: Diagnosis not present

## 2020-02-07 DIAGNOSIS — M818 Other osteoporosis without current pathological fracture: Secondary | ICD-10-CM | POA: Diagnosis not present

## 2020-02-07 DIAGNOSIS — N184 Chronic kidney disease, stage 4 (severe): Secondary | ICD-10-CM | POA: Diagnosis not present

## 2020-02-07 DIAGNOSIS — Z981 Arthrodesis status: Secondary | ICD-10-CM | POA: Diagnosis not present

## 2020-02-07 DIAGNOSIS — Z951 Presence of aortocoronary bypass graft: Secondary | ICD-10-CM | POA: Diagnosis not present

## 2020-02-07 DIAGNOSIS — I4892 Unspecified atrial flutter: Secondary | ICD-10-CM | POA: Diagnosis not present

## 2020-02-07 DIAGNOSIS — F339 Major depressive disorder, recurrent, unspecified: Secondary | ICD-10-CM | POA: Diagnosis not present

## 2020-02-07 DIAGNOSIS — I779 Disorder of arteries and arterioles, unspecified: Secondary | ICD-10-CM | POA: Diagnosis not present

## 2020-02-07 DIAGNOSIS — M549 Dorsalgia, unspecified: Secondary | ICD-10-CM | POA: Diagnosis not present

## 2020-02-07 DIAGNOSIS — I119 Hypertensive heart disease without heart failure: Secondary | ICD-10-CM | POA: Diagnosis not present

## 2020-02-07 DIAGNOSIS — Z170 Estrogen receptor positive status [ER+]: Secondary | ICD-10-CM | POA: Diagnosis not present

## 2020-02-07 DIAGNOSIS — K5792 Diverticulitis of intestine, part unspecified, without perforation or abscess without bleeding: Secondary | ICD-10-CM | POA: Diagnosis not present

## 2020-02-07 DIAGNOSIS — E1152 Type 2 diabetes mellitus with diabetic peripheral angiopathy with gangrene: Secondary | ICD-10-CM | POA: Diagnosis not present

## 2020-02-07 DIAGNOSIS — Z1212 Encounter for screening for malignant neoplasm of rectum: Secondary | ICD-10-CM | POA: Diagnosis not present

## 2020-02-07 DIAGNOSIS — D751 Secondary polycythemia: Secondary | ICD-10-CM | POA: Diagnosis not present

## 2020-02-07 DIAGNOSIS — I5043 Acute on chronic combined systolic (congestive) and diastolic (congestive) heart failure: Secondary | ICD-10-CM | POA: Diagnosis not present

## 2020-02-07 DIAGNOSIS — Z90710 Acquired absence of both cervix and uterus: Secondary | ICD-10-CM | POA: Diagnosis not present

## 2020-02-07 DIAGNOSIS — N179 Acute kidney failure, unspecified: Secondary | ICD-10-CM | POA: Diagnosis not present

## 2020-02-07 DIAGNOSIS — I712 Thoracic aortic aneurysm, without rupture: Secondary | ICD-10-CM | POA: Diagnosis not present

## 2020-02-07 DIAGNOSIS — K5903 Drug induced constipation: Secondary | ICD-10-CM | POA: Diagnosis not present

## 2020-02-07 DIAGNOSIS — C189 Malignant neoplasm of colon, unspecified: Secondary | ICD-10-CM | POA: Diagnosis not present

## 2020-02-07 DIAGNOSIS — Z8781 Personal history of (healed) traumatic fracture: Secondary | ICD-10-CM | POA: Diagnosis not present

## 2020-02-07 DIAGNOSIS — K625 Hemorrhage of anus and rectum: Secondary | ICD-10-CM | POA: Diagnosis not present

## 2020-02-07 DIAGNOSIS — M25561 Pain in right knee: Secondary | ICD-10-CM | POA: Diagnosis not present

## 2020-02-07 DIAGNOSIS — J3089 Other allergic rhinitis: Secondary | ICD-10-CM | POA: Diagnosis not present

## 2020-02-07 DIAGNOSIS — Z5112 Encounter for antineoplastic immunotherapy: Secondary | ICD-10-CM | POA: Diagnosis not present

## 2020-02-07 DIAGNOSIS — E559 Vitamin D deficiency, unspecified: Secondary | ICD-10-CM | POA: Diagnosis not present

## 2020-02-07 DIAGNOSIS — M8580 Other specified disorders of bone density and structure, unspecified site: Secondary | ICD-10-CM | POA: Diagnosis not present

## 2020-02-07 DIAGNOSIS — Z882 Allergy status to sulfonamides status: Secondary | ICD-10-CM | POA: Diagnosis not present

## 2020-02-07 DIAGNOSIS — J029 Acute pharyngitis, unspecified: Secondary | ICD-10-CM | POA: Diagnosis not present

## 2020-02-07 DIAGNOSIS — L814 Other melanin hyperpigmentation: Secondary | ICD-10-CM | POA: Diagnosis not present

## 2020-02-07 DIAGNOSIS — R05 Cough: Secondary | ICD-10-CM | POA: Diagnosis not present

## 2020-02-07 DIAGNOSIS — C44619 Basal cell carcinoma of skin of left upper limb, including shoulder: Secondary | ICD-10-CM | POA: Diagnosis not present

## 2020-02-07 DIAGNOSIS — C670 Malignant neoplasm of trigone of bladder: Secondary | ICD-10-CM | POA: Diagnosis not present

## 2020-02-07 DIAGNOSIS — D1801 Hemangioma of skin and subcutaneous tissue: Secondary | ICD-10-CM | POA: Diagnosis not present

## 2020-02-07 DIAGNOSIS — M75112 Incomplete rotator cuff tear or rupture of left shoulder, not specified as traumatic: Secondary | ICD-10-CM | POA: Diagnosis not present

## 2020-02-07 DIAGNOSIS — M5417 Radiculopathy, lumbosacral region: Secondary | ICD-10-CM | POA: Diagnosis not present

## 2020-02-07 DIAGNOSIS — A411 Sepsis due to other specified staphylococcus: Secondary | ICD-10-CM | POA: Diagnosis not present

## 2020-02-07 DIAGNOSIS — M75102 Unspecified rotator cuff tear or rupture of left shoulder, not specified as traumatic: Secondary | ICD-10-CM | POA: Diagnosis not present

## 2020-02-07 DIAGNOSIS — Z1211 Encounter for screening for malignant neoplasm of colon: Secondary | ICD-10-CM | POA: Diagnosis not present

## 2020-02-07 DIAGNOSIS — C7989 Secondary malignant neoplasm of other specified sites: Secondary | ICD-10-CM | POA: Diagnosis not present

## 2020-02-07 DIAGNOSIS — E871 Hypo-osmolality and hyponatremia: Secondary | ICD-10-CM | POA: Diagnosis not present

## 2020-02-07 DIAGNOSIS — Z885 Allergy status to narcotic agent status: Secondary | ICD-10-CM | POA: Diagnosis not present

## 2020-02-07 DIAGNOSIS — I25810 Atherosclerosis of coronary artery bypass graft(s) without angina pectoris: Secondary | ICD-10-CM | POA: Diagnosis not present

## 2020-02-07 DIAGNOSIS — I050 Rheumatic mitral stenosis: Secondary | ICD-10-CM | POA: Diagnosis not present

## 2020-02-07 DIAGNOSIS — H353231 Exudative age-related macular degeneration, bilateral, with active choroidal neovascularization: Secondary | ICD-10-CM | POA: Diagnosis not present

## 2020-02-07 DIAGNOSIS — M799 Soft tissue disorder, unspecified: Secondary | ICD-10-CM | POA: Diagnosis not present

## 2020-02-07 DIAGNOSIS — K2980 Duodenitis without bleeding: Secondary | ICD-10-CM | POA: Diagnosis not present

## 2020-02-07 DIAGNOSIS — Z923 Personal history of irradiation: Secondary | ICD-10-CM | POA: Diagnosis not present

## 2020-02-07 DIAGNOSIS — E11649 Type 2 diabetes mellitus with hypoglycemia without coma: Secondary | ICD-10-CM | POA: Diagnosis not present

## 2020-02-07 DIAGNOSIS — L22 Diaper dermatitis: Secondary | ICD-10-CM | POA: Diagnosis not present

## 2020-02-07 DIAGNOSIS — M4726 Other spondylosis with radiculopathy, lumbar region: Secondary | ICD-10-CM | POA: Diagnosis not present

## 2020-02-07 DIAGNOSIS — Z515 Encounter for palliative care: Secondary | ICD-10-CM | POA: Diagnosis not present

## 2020-02-07 DIAGNOSIS — M5126 Other intervertebral disc displacement, lumbar region: Secondary | ICD-10-CM | POA: Diagnosis not present

## 2020-02-07 DIAGNOSIS — I272 Pulmonary hypertension, unspecified: Secondary | ICD-10-CM | POA: Diagnosis not present

## 2020-02-07 DIAGNOSIS — G20 Parkinson's disease: Secondary | ICD-10-CM | POA: Diagnosis not present

## 2020-02-07 DIAGNOSIS — I200 Unstable angina: Secondary | ICD-10-CM | POA: Diagnosis not present

## 2020-02-07 DIAGNOSIS — D72820 Lymphocytosis (symptomatic): Secondary | ICD-10-CM | POA: Diagnosis not present

## 2020-02-07 DIAGNOSIS — G250 Essential tremor: Secondary | ICD-10-CM | POA: Diagnosis not present

## 2020-02-07 DIAGNOSIS — D485 Neoplasm of uncertain behavior of skin: Secondary | ICD-10-CM | POA: Diagnosis not present

## 2020-02-07 DIAGNOSIS — F0151 Vascular dementia with behavioral disturbance: Secondary | ICD-10-CM | POA: Diagnosis not present

## 2020-02-07 DIAGNOSIS — H35033 Hypertensive retinopathy, bilateral: Secondary | ICD-10-CM | POA: Diagnosis not present

## 2020-02-07 DIAGNOSIS — R911 Solitary pulmonary nodule: Secondary | ICD-10-CM | POA: Diagnosis not present

## 2020-02-07 DIAGNOSIS — R072 Precordial pain: Secondary | ICD-10-CM | POA: Diagnosis not present

## 2020-02-07 DIAGNOSIS — J1282 Pneumonia due to coronavirus disease 2019: Secondary | ICD-10-CM | POA: Diagnosis not present

## 2020-02-07 DIAGNOSIS — M2550 Pain in unspecified joint: Secondary | ICD-10-CM | POA: Diagnosis not present

## 2020-02-07 DIAGNOSIS — Z5321 Procedure and treatment not carried out due to patient leaving prior to being seen by health care provider: Secondary | ICD-10-CM | POA: Diagnosis not present

## 2020-02-07 DIAGNOSIS — G459 Transient cerebral ischemic attack, unspecified: Secondary | ICD-10-CM | POA: Diagnosis not present

## 2020-02-07 DIAGNOSIS — R040 Epistaxis: Secondary | ICD-10-CM | POA: Diagnosis not present

## 2020-02-07 DIAGNOSIS — M179 Osteoarthritis of knee, unspecified: Secondary | ICD-10-CM | POA: Diagnosis not present

## 2020-02-07 DIAGNOSIS — N3001 Acute cystitis with hematuria: Secondary | ICD-10-CM | POA: Diagnosis not present

## 2020-02-07 DIAGNOSIS — Z8572 Personal history of non-Hodgkin lymphomas: Secondary | ICD-10-CM | POA: Diagnosis not present

## 2020-02-07 DIAGNOSIS — M7541 Impingement syndrome of right shoulder: Secondary | ICD-10-CM | POA: Diagnosis not present

## 2020-02-07 DIAGNOSIS — Z96612 Presence of left artificial shoulder joint: Secondary | ICD-10-CM | POA: Diagnosis not present

## 2020-02-07 DIAGNOSIS — M13862 Other specified arthritis, left knee: Secondary | ICD-10-CM | POA: Diagnosis not present

## 2020-02-07 DIAGNOSIS — J45901 Unspecified asthma with (acute) exacerbation: Secondary | ICD-10-CM | POA: Diagnosis not present

## 2020-02-07 DIAGNOSIS — I63419 Cerebral infarction due to embolism of unspecified middle cerebral artery: Secondary | ICD-10-CM | POA: Diagnosis not present

## 2020-02-07 DIAGNOSIS — Z6841 Body Mass Index (BMI) 40.0 and over, adult: Secondary | ICD-10-CM | POA: Diagnosis not present

## 2020-02-07 DIAGNOSIS — R918 Other nonspecific abnormal finding of lung field: Secondary | ICD-10-CM | POA: Diagnosis not present

## 2020-02-07 DIAGNOSIS — H6980 Other specified disorders of Eustachian tube, unspecified ear: Secondary | ICD-10-CM | POA: Diagnosis not present

## 2020-02-07 DIAGNOSIS — M545 Low back pain: Secondary | ICD-10-CM | POA: Diagnosis not present

## 2020-02-07 DIAGNOSIS — L720 Epidermal cyst: Secondary | ICD-10-CM | POA: Diagnosis not present

## 2020-02-07 DIAGNOSIS — F4310 Post-traumatic stress disorder, unspecified: Secondary | ICD-10-CM | POA: Diagnosis not present

## 2020-02-07 DIAGNOSIS — K7290 Hepatic failure, unspecified without coma: Secondary | ICD-10-CM | POA: Diagnosis not present

## 2020-02-07 DIAGNOSIS — N189 Chronic kidney disease, unspecified: Secondary | ICD-10-CM | POA: Diagnosis not present

## 2020-02-07 DIAGNOSIS — B351 Tinea unguium: Secondary | ICD-10-CM | POA: Diagnosis not present

## 2020-02-07 DIAGNOSIS — D696 Thrombocytopenia, unspecified: Secondary | ICD-10-CM | POA: Diagnosis not present

## 2020-02-07 DIAGNOSIS — G2581 Restless legs syndrome: Secondary | ICD-10-CM | POA: Diagnosis not present

## 2020-02-07 DIAGNOSIS — F419 Anxiety disorder, unspecified: Secondary | ICD-10-CM | POA: Diagnosis not present

## 2020-02-07 DIAGNOSIS — R2689 Other abnormalities of gait and mobility: Secondary | ICD-10-CM | POA: Diagnosis not present

## 2020-02-07 DIAGNOSIS — E872 Acidosis: Secondary | ICD-10-CM | POA: Diagnosis not present

## 2020-02-07 DIAGNOSIS — L98412 Non-pressure chronic ulcer of buttock with fat layer exposed: Secondary | ICD-10-CM | POA: Diagnosis not present

## 2020-02-07 DIAGNOSIS — N400 Enlarged prostate without lower urinary tract symptoms: Secondary | ICD-10-CM | POA: Diagnosis not present

## 2020-02-07 DIAGNOSIS — C50512 Malignant neoplasm of lower-outer quadrant of left female breast: Secondary | ICD-10-CM | POA: Diagnosis not present

## 2020-02-07 DIAGNOSIS — Z20828 Contact with and (suspected) exposure to other viral communicable diseases: Secondary | ICD-10-CM | POA: Diagnosis not present

## 2020-02-07 DIAGNOSIS — H409 Unspecified glaucoma: Secondary | ICD-10-CM | POA: Diagnosis not present

## 2020-02-07 DIAGNOSIS — M25562 Pain in left knee: Secondary | ICD-10-CM | POA: Diagnosis not present

## 2020-02-07 DIAGNOSIS — D62 Acute posthemorrhagic anemia: Secondary | ICD-10-CM | POA: Diagnosis not present

## 2020-02-07 DIAGNOSIS — R1013 Epigastric pain: Secondary | ICD-10-CM | POA: Diagnosis not present

## 2020-02-07 DIAGNOSIS — Z125 Encounter for screening for malignant neoplasm of prostate: Secondary | ICD-10-CM | POA: Diagnosis not present

## 2020-02-07 DIAGNOSIS — G8929 Other chronic pain: Secondary | ICD-10-CM | POA: Diagnosis not present

## 2020-02-07 DIAGNOSIS — D689 Coagulation defect, unspecified: Secondary | ICD-10-CM | POA: Diagnosis not present

## 2020-02-07 DIAGNOSIS — Z9181 History of falling: Secondary | ICD-10-CM | POA: Diagnosis not present

## 2020-02-07 DIAGNOSIS — Q211 Atrial septal defect: Secondary | ICD-10-CM | POA: Diagnosis not present

## 2020-02-07 DIAGNOSIS — M25511 Pain in right shoulder: Secondary | ICD-10-CM | POA: Diagnosis not present

## 2020-02-07 DIAGNOSIS — R7303 Prediabetes: Secondary | ICD-10-CM | POA: Diagnosis not present

## 2020-02-07 DIAGNOSIS — M25662 Stiffness of left knee, not elsewhere classified: Secondary | ICD-10-CM | POA: Diagnosis not present

## 2020-02-07 DIAGNOSIS — D510 Vitamin B12 deficiency anemia due to intrinsic factor deficiency: Secondary | ICD-10-CM | POA: Diagnosis not present

## 2020-02-07 DIAGNOSIS — D638 Anemia in other chronic diseases classified elsewhere: Secondary | ICD-10-CM | POA: Diagnosis not present

## 2020-02-07 DIAGNOSIS — E860 Dehydration: Secondary | ICD-10-CM | POA: Diagnosis not present

## 2020-02-07 DIAGNOSIS — I50812 Chronic right heart failure: Secondary | ICD-10-CM | POA: Diagnosis not present

## 2020-02-07 DIAGNOSIS — Z806 Family history of leukemia: Secondary | ICD-10-CM | POA: Diagnosis not present

## 2020-02-07 DIAGNOSIS — I2609 Other pulmonary embolism with acute cor pulmonale: Secondary | ICD-10-CM | POA: Diagnosis not present

## 2020-02-07 DIAGNOSIS — Z720 Tobacco use: Secondary | ICD-10-CM | POA: Diagnosis not present

## 2020-02-07 DIAGNOSIS — M25661 Stiffness of right knee, not elsewhere classified: Secondary | ICD-10-CM | POA: Diagnosis not present

## 2020-02-07 DIAGNOSIS — I259 Chronic ischemic heart disease, unspecified: Secondary | ICD-10-CM | POA: Diagnosis not present

## 2020-02-07 DIAGNOSIS — M1712 Unilateral primary osteoarthritis, left knee: Secondary | ICD-10-CM | POA: Diagnosis not present

## 2020-02-07 DIAGNOSIS — H903 Sensorineural hearing loss, bilateral: Secondary | ICD-10-CM | POA: Diagnosis not present

## 2020-02-07 DIAGNOSIS — C9110 Chronic lymphocytic leukemia of B-cell type not having achieved remission: Secondary | ICD-10-CM | POA: Diagnosis not present

## 2020-02-07 DIAGNOSIS — L821 Other seborrheic keratosis: Secondary | ICD-10-CM | POA: Diagnosis not present

## 2020-02-07 DIAGNOSIS — G629 Polyneuropathy, unspecified: Secondary | ICD-10-CM | POA: Diagnosis not present

## 2020-02-07 DIAGNOSIS — R262 Difficulty in walking, not elsewhere classified: Secondary | ICD-10-CM | POA: Diagnosis not present

## 2020-02-07 DIAGNOSIS — H1045 Other chronic allergic conjunctivitis: Secondary | ICD-10-CM | POA: Diagnosis not present

## 2020-02-07 DIAGNOSIS — M6281 Muscle weakness (generalized): Secondary | ICD-10-CM | POA: Diagnosis not present

## 2020-02-07 DIAGNOSIS — M0579 Rheumatoid arthritis with rheumatoid factor of multiple sites without organ or systems involvement: Secondary | ICD-10-CM | POA: Diagnosis not present

## 2020-02-07 DIAGNOSIS — L539 Erythematous condition, unspecified: Secondary | ICD-10-CM | POA: Diagnosis not present

## 2020-02-07 DIAGNOSIS — E1142 Type 2 diabetes mellitus with diabetic polyneuropathy: Secondary | ICD-10-CM | POA: Diagnosis not present

## 2020-02-07 DIAGNOSIS — N10 Acute tubulo-interstitial nephritis: Secondary | ICD-10-CM | POA: Diagnosis not present

## 2020-02-07 DIAGNOSIS — I639 Cerebral infarction, unspecified: Secondary | ICD-10-CM | POA: Diagnosis not present

## 2020-02-07 DIAGNOSIS — L570 Actinic keratosis: Secondary | ICD-10-CM | POA: Diagnosis not present

## 2020-02-07 DIAGNOSIS — F329 Major depressive disorder, single episode, unspecified: Secondary | ICD-10-CM | POA: Diagnosis not present

## 2020-02-07 DIAGNOSIS — I491 Atrial premature depolarization: Secondary | ICD-10-CM | POA: Diagnosis not present

## 2020-02-07 DIAGNOSIS — R5381 Other malaise: Secondary | ICD-10-CM | POA: Diagnosis not present

## 2020-02-07 DIAGNOSIS — Z8673 Personal history of transient ischemic attack (TIA), and cerebral infarction without residual deficits: Secondary | ICD-10-CM | POA: Diagnosis not present

## 2020-02-07 DIAGNOSIS — Z9841 Cataract extraction status, right eye: Secondary | ICD-10-CM | POA: Diagnosis not present

## 2020-02-07 DIAGNOSIS — F324 Major depressive disorder, single episode, in partial remission: Secondary | ICD-10-CM | POA: Diagnosis not present

## 2020-02-07 DIAGNOSIS — M25611 Stiffness of right shoulder, not elsewhere classified: Secondary | ICD-10-CM | POA: Diagnosis not present

## 2020-02-07 DIAGNOSIS — M1611 Unilateral primary osteoarthritis, right hip: Secondary | ICD-10-CM | POA: Diagnosis not present

## 2020-02-07 DIAGNOSIS — R739 Hyperglycemia, unspecified: Secondary | ICD-10-CM | POA: Diagnosis not present

## 2020-02-07 DIAGNOSIS — R269 Unspecified abnormalities of gait and mobility: Secondary | ICD-10-CM | POA: Diagnosis not present

## 2020-02-07 DIAGNOSIS — Z90711 Acquired absence of uterus with remaining cervical stump: Secondary | ICD-10-CM | POA: Diagnosis not present

## 2020-02-07 DIAGNOSIS — D509 Iron deficiency anemia, unspecified: Secondary | ICD-10-CM | POA: Diagnosis not present

## 2020-02-07 DIAGNOSIS — R293 Abnormal posture: Secondary | ICD-10-CM | POA: Diagnosis not present

## 2020-02-07 DIAGNOSIS — J810 Acute pulmonary edema: Secondary | ICD-10-CM | POA: Diagnosis not present

## 2020-02-07 DIAGNOSIS — G4700 Insomnia, unspecified: Secondary | ICD-10-CM | POA: Diagnosis not present

## 2020-02-07 DIAGNOSIS — M5416 Radiculopathy, lumbar region: Secondary | ICD-10-CM | POA: Diagnosis not present

## 2020-02-07 DIAGNOSIS — G8194 Hemiplegia, unspecified affecting left nondominant side: Secondary | ICD-10-CM | POA: Diagnosis not present

## 2020-02-07 DIAGNOSIS — N281 Cyst of kidney, acquired: Secondary | ICD-10-CM | POA: Diagnosis not present

## 2020-02-07 DIAGNOSIS — Z955 Presence of coronary angioplasty implant and graft: Secondary | ICD-10-CM | POA: Diagnosis not present

## 2020-02-07 DIAGNOSIS — R300 Dysuria: Secondary | ICD-10-CM | POA: Diagnosis not present

## 2020-02-07 DIAGNOSIS — F418 Other specified anxiety disorders: Secondary | ICD-10-CM | POA: Diagnosis not present

## 2020-02-07 DIAGNOSIS — R0789 Other chest pain: Secondary | ICD-10-CM | POA: Diagnosis not present

## 2020-02-07 DIAGNOSIS — F0391 Unspecified dementia with behavioral disturbance: Secondary | ICD-10-CM | POA: Diagnosis not present

## 2020-02-07 DIAGNOSIS — M7661 Achilles tendinitis, right leg: Secondary | ICD-10-CM | POA: Diagnosis not present

## 2020-02-07 DIAGNOSIS — J9601 Acute respiratory failure with hypoxia: Secondary | ICD-10-CM | POA: Diagnosis not present

## 2020-02-07 DIAGNOSIS — I129 Hypertensive chronic kidney disease with stage 1 through stage 4 chronic kidney disease, or unspecified chronic kidney disease: Secondary | ICD-10-CM | POA: Diagnosis not present

## 2020-02-07 DIAGNOSIS — H43391 Other vitreous opacities, right eye: Secondary | ICD-10-CM | POA: Diagnosis not present

## 2020-02-07 DIAGNOSIS — I2510 Atherosclerotic heart disease of native coronary artery without angina pectoris: Secondary | ICD-10-CM | POA: Diagnosis not present

## 2020-02-07 DIAGNOSIS — S42141D Displaced fracture of glenoid cavity of scapula, right shoulder, subsequent encounter for fracture with routine healing: Secondary | ICD-10-CM | POA: Diagnosis not present

## 2020-02-07 DIAGNOSIS — M1711 Unilateral primary osteoarthritis, right knee: Secondary | ICD-10-CM | POA: Diagnosis not present

## 2020-02-07 DIAGNOSIS — L578 Other skin changes due to chronic exposure to nonionizing radiation: Secondary | ICD-10-CM | POA: Diagnosis not present

## 2020-02-07 DIAGNOSIS — Z961 Presence of intraocular lens: Secondary | ICD-10-CM | POA: Diagnosis not present

## 2020-02-07 DIAGNOSIS — R208 Other disturbances of skin sensation: Secondary | ICD-10-CM | POA: Diagnosis not present

## 2020-02-07 DIAGNOSIS — M79672 Pain in left foot: Secondary | ICD-10-CM | POA: Diagnosis not present

## 2020-02-07 DIAGNOSIS — M153 Secondary multiple arthritis: Secondary | ICD-10-CM | POA: Diagnosis not present

## 2020-02-07 DIAGNOSIS — M069 Rheumatoid arthritis, unspecified: Secondary | ICD-10-CM | POA: Diagnosis not present

## 2020-02-07 DIAGNOSIS — F3341 Major depressive disorder, recurrent, in partial remission: Secondary | ICD-10-CM | POA: Diagnosis not present

## 2020-02-07 DIAGNOSIS — K900 Celiac disease: Secondary | ICD-10-CM | POA: Diagnosis not present

## 2020-02-07 DIAGNOSIS — D729 Disorder of white blood cells, unspecified: Secondary | ICD-10-CM | POA: Diagnosis not present

## 2020-02-07 DIAGNOSIS — D472 Monoclonal gammopathy: Secondary | ICD-10-CM | POA: Diagnosis not present

## 2020-02-07 DIAGNOSIS — I517 Cardiomegaly: Secondary | ICD-10-CM | POA: Diagnosis not present

## 2020-02-07 DIAGNOSIS — D0511 Intraductal carcinoma in situ of right breast: Secondary | ICD-10-CM | POA: Diagnosis not present

## 2020-02-07 DIAGNOSIS — J0110 Acute frontal sinusitis, unspecified: Secondary | ICD-10-CM | POA: Diagnosis not present

## 2020-02-07 DIAGNOSIS — M170 Bilateral primary osteoarthritis of knee: Secondary | ICD-10-CM | POA: Diagnosis not present

## 2020-02-07 DIAGNOSIS — F3342 Major depressive disorder, recurrent, in full remission: Secondary | ICD-10-CM | POA: Diagnosis not present

## 2020-02-07 DIAGNOSIS — M62552 Muscle wasting and atrophy, not elsewhere classified, left thigh: Secondary | ICD-10-CM | POA: Diagnosis not present

## 2020-02-07 DIAGNOSIS — S2241XD Multiple fractures of ribs, right side, subsequent encounter for fracture with routine healing: Secondary | ICD-10-CM | POA: Diagnosis not present

## 2020-02-07 DIAGNOSIS — F4323 Adjustment disorder with mixed anxiety and depressed mood: Secondary | ICD-10-CM | POA: Diagnosis not present

## 2020-02-07 DIAGNOSIS — N401 Enlarged prostate with lower urinary tract symptoms: Secondary | ICD-10-CM | POA: Diagnosis not present

## 2020-02-07 DIAGNOSIS — F332 Major depressive disorder, recurrent severe without psychotic features: Secondary | ICD-10-CM | POA: Diagnosis not present

## 2020-02-07 DIAGNOSIS — G44209 Tension-type headache, unspecified, not intractable: Secondary | ICD-10-CM | POA: Diagnosis not present

## 2020-02-07 DIAGNOSIS — M25672 Stiffness of left ankle, not elsewhere classified: Secondary | ICD-10-CM | POA: Diagnosis not present

## 2020-02-07 DIAGNOSIS — M13861 Other specified arthritis, right knee: Secondary | ICD-10-CM | POA: Diagnosis not present

## 2020-02-07 DIAGNOSIS — I5031 Acute diastolic (congestive) heart failure: Secondary | ICD-10-CM | POA: Diagnosis not present

## 2020-02-07 DIAGNOSIS — D500 Iron deficiency anemia secondary to blood loss (chronic): Secondary | ICD-10-CM | POA: Diagnosis not present

## 2020-02-07 DIAGNOSIS — N183 Chronic kidney disease, stage 3 unspecified: Secondary | ICD-10-CM | POA: Diagnosis not present

## 2020-02-07 DIAGNOSIS — M25652 Stiffness of left hip, not elsewhere classified: Secondary | ICD-10-CM | POA: Diagnosis not present

## 2020-02-07 DIAGNOSIS — E1169 Type 2 diabetes mellitus with other specified complication: Secondary | ICD-10-CM | POA: Diagnosis not present

## 2020-02-07 DIAGNOSIS — I252 Old myocardial infarction: Secondary | ICD-10-CM | POA: Diagnosis not present

## 2020-02-07 DIAGNOSIS — M4722 Other spondylosis with radiculopathy, cervical region: Secondary | ICD-10-CM | POA: Diagnosis not present

## 2020-02-07 DIAGNOSIS — H16223 Keratoconjunctivitis sicca, not specified as Sjogren's, bilateral: Secondary | ICD-10-CM | POA: Diagnosis not present

## 2020-02-07 DIAGNOSIS — E039 Hypothyroidism, unspecified: Secondary | ICD-10-CM | POA: Diagnosis not present

## 2020-02-07 DIAGNOSIS — L820 Inflamed seborrheic keratosis: Secondary | ICD-10-CM | POA: Diagnosis not present

## 2020-02-07 DIAGNOSIS — C3432 Malignant neoplasm of lower lobe, left bronchus or lung: Secondary | ICD-10-CM | POA: Diagnosis not present

## 2020-02-07 DIAGNOSIS — C61 Malignant neoplasm of prostate: Secondary | ICD-10-CM | POA: Diagnosis not present

## 2020-02-07 DIAGNOSIS — I25119 Atherosclerotic heart disease of native coronary artery with unspecified angina pectoris: Secondary | ICD-10-CM | POA: Diagnosis not present

## 2020-02-07 DIAGNOSIS — J90 Pleural effusion, not elsewhere classified: Secondary | ICD-10-CM | POA: Diagnosis not present

## 2020-02-07 DIAGNOSIS — I5021 Acute systolic (congestive) heart failure: Secondary | ICD-10-CM | POA: Diagnosis not present

## 2020-02-07 DIAGNOSIS — Z85828 Personal history of other malignant neoplasm of skin: Secondary | ICD-10-CM | POA: Diagnosis not present

## 2020-02-07 DIAGNOSIS — R2681 Unsteadiness on feet: Secondary | ICD-10-CM | POA: Diagnosis not present

## 2020-02-07 DIAGNOSIS — M9902 Segmental and somatic dysfunction of thoracic region: Secondary | ICD-10-CM | POA: Diagnosis not present

## 2020-02-07 DIAGNOSIS — H401121 Primary open-angle glaucoma, left eye, mild stage: Secondary | ICD-10-CM | POA: Diagnosis not present

## 2020-02-07 DIAGNOSIS — K91840 Postprocedural hemorrhage and hematoma of a digestive system organ or structure following a digestive system procedure: Secondary | ICD-10-CM | POA: Diagnosis not present

## 2020-02-07 DIAGNOSIS — R748 Abnormal levels of other serum enzymes: Secondary | ICD-10-CM | POA: Diagnosis not present

## 2020-02-07 DIAGNOSIS — M7662 Achilles tendinitis, left leg: Secondary | ICD-10-CM | POA: Diagnosis not present

## 2020-02-07 DIAGNOSIS — M79671 Pain in right foot: Secondary | ICD-10-CM | POA: Diagnosis not present

## 2020-02-07 DIAGNOSIS — N1831 Chronic kidney disease, stage 3a: Secondary | ICD-10-CM | POA: Diagnosis not present

## 2020-02-07 DIAGNOSIS — R161 Splenomegaly, not elsewhere classified: Secondary | ICD-10-CM | POA: Diagnosis not present

## 2020-02-07 DIAGNOSIS — E113413 Type 2 diabetes mellitus with severe nonproliferative diabetic retinopathy with macular edema, bilateral: Secondary | ICD-10-CM | POA: Diagnosis not present

## 2020-02-07 DIAGNOSIS — G43009 Migraine without aura, not intractable, without status migrainosus: Secondary | ICD-10-CM | POA: Diagnosis not present

## 2020-02-07 DIAGNOSIS — C44311 Basal cell carcinoma of skin of nose: Secondary | ICD-10-CM | POA: Diagnosis not present

## 2020-02-07 DIAGNOSIS — H52223 Regular astigmatism, bilateral: Secondary | ICD-10-CM | POA: Diagnosis not present

## 2020-02-07 DIAGNOSIS — F900 Attention-deficit hyperactivity disorder, predominantly inattentive type: Secondary | ICD-10-CM | POA: Diagnosis not present

## 2020-02-07 DIAGNOSIS — Z7901 Long term (current) use of anticoagulants: Secondary | ICD-10-CM | POA: Diagnosis not present

## 2020-02-07 DIAGNOSIS — M75122 Complete rotator cuff tear or rupture of left shoulder, not specified as traumatic: Secondary | ICD-10-CM | POA: Diagnosis not present

## 2020-02-07 DIAGNOSIS — M5134 Other intervertebral disc degeneration, thoracic region: Secondary | ICD-10-CM | POA: Diagnosis not present

## 2020-02-07 DIAGNOSIS — R531 Weakness: Secondary | ICD-10-CM | POA: Diagnosis not present

## 2020-02-07 DIAGNOSIS — R6521 Severe sepsis with septic shock: Secondary | ICD-10-CM | POA: Diagnosis not present

## 2020-02-07 DIAGNOSIS — R34 Anuria and oliguria: Secondary | ICD-10-CM | POA: Diagnosis not present

## 2020-02-07 DIAGNOSIS — R441 Visual hallucinations: Secondary | ICD-10-CM | POA: Diagnosis not present

## 2020-02-07 DIAGNOSIS — I5022 Chronic systolic (congestive) heart failure: Secondary | ICD-10-CM | POA: Diagnosis not present

## 2020-02-07 DIAGNOSIS — J45998 Other asthma: Secondary | ICD-10-CM | POA: Diagnosis not present

## 2020-02-07 DIAGNOSIS — E875 Hyperkalemia: Secondary | ICD-10-CM | POA: Diagnosis not present

## 2020-02-07 DIAGNOSIS — K529 Noninfective gastroenteritis and colitis, unspecified: Secondary | ICD-10-CM | POA: Diagnosis not present

## 2020-02-07 DIAGNOSIS — I5033 Acute on chronic diastolic (congestive) heart failure: Secondary | ICD-10-CM | POA: Diagnosis not present

## 2020-02-07 DIAGNOSIS — B373 Candidiasis of vulva and vagina: Secondary | ICD-10-CM | POA: Diagnosis not present

## 2020-02-07 DIAGNOSIS — J301 Allergic rhinitis due to pollen: Secondary | ICD-10-CM | POA: Diagnosis not present

## 2020-02-07 DIAGNOSIS — E213 Hyperparathyroidism, unspecified: Secondary | ICD-10-CM | POA: Diagnosis not present

## 2020-02-07 DIAGNOSIS — Z1389 Encounter for screening for other disorder: Secondary | ICD-10-CM | POA: Diagnosis not present

## 2020-02-07 DIAGNOSIS — I5032 Chronic diastolic (congestive) heart failure: Secondary | ICD-10-CM | POA: Diagnosis not present

## 2020-02-07 DIAGNOSIS — U071 COVID-19: Secondary | ICD-10-CM | POA: Diagnosis not present

## 2020-02-07 DIAGNOSIS — C44121 Squamous cell carcinoma of skin of unspecified eyelid, including canthus: Secondary | ICD-10-CM | POA: Diagnosis not present

## 2020-02-07 DIAGNOSIS — F4321 Adjustment disorder with depressed mood: Secondary | ICD-10-CM | POA: Diagnosis not present

## 2020-02-07 DIAGNOSIS — M1830 Unilateral post-traumatic osteoarthritis of first carpometacarpal joint, unspecified hand: Secondary | ICD-10-CM | POA: Diagnosis not present

## 2020-02-07 DIAGNOSIS — Z886 Allergy status to analgesic agent status: Secondary | ICD-10-CM | POA: Diagnosis not present

## 2020-02-07 DIAGNOSIS — K648 Other hemorrhoids: Secondary | ICD-10-CM | POA: Diagnosis not present

## 2020-02-07 DIAGNOSIS — Z7982 Long term (current) use of aspirin: Secondary | ICD-10-CM | POA: Diagnosis not present

## 2020-02-07 DIAGNOSIS — R5383 Other fatigue: Secondary | ICD-10-CM | POA: Diagnosis not present

## 2020-02-07 DIAGNOSIS — K7460 Unspecified cirrhosis of liver: Secondary | ICD-10-CM | POA: Diagnosis not present

## 2020-02-07 DIAGNOSIS — Z8571 Personal history of Hodgkin lymphoma: Secondary | ICD-10-CM | POA: Diagnosis not present

## 2020-02-07 DIAGNOSIS — F319 Bipolar disorder, unspecified: Secondary | ICD-10-CM | POA: Diagnosis not present

## 2020-02-07 DIAGNOSIS — R600 Localized edema: Secondary | ICD-10-CM | POA: Diagnosis not present

## 2020-02-07 DIAGNOSIS — J449 Chronic obstructive pulmonary disease, unspecified: Secondary | ICD-10-CM | POA: Diagnosis not present

## 2020-02-07 DIAGNOSIS — S43491D Other sprain of right shoulder joint, subsequent encounter: Secondary | ICD-10-CM | POA: Diagnosis not present

## 2020-02-07 DIAGNOSIS — M4804 Spinal stenosis, thoracic region: Secondary | ICD-10-CM | POA: Diagnosis not present

## 2020-02-07 DIAGNOSIS — M25552 Pain in left hip: Secondary | ICD-10-CM | POA: Diagnosis not present

## 2020-02-07 DIAGNOSIS — F325 Major depressive disorder, single episode, in full remission: Secondary | ICD-10-CM | POA: Diagnosis not present

## 2020-02-07 DIAGNOSIS — M542 Cervicalgia: Secondary | ICD-10-CM | POA: Diagnosis not present

## 2020-02-07 DIAGNOSIS — Z7189 Other specified counseling: Secondary | ICD-10-CM | POA: Diagnosis not present

## 2020-02-07 DIAGNOSIS — S2231XA Fracture of one rib, right side, initial encounter for closed fracture: Secondary | ICD-10-CM | POA: Diagnosis not present

## 2020-02-07 DIAGNOSIS — I110 Hypertensive heart disease with heart failure: Secondary | ICD-10-CM | POA: Diagnosis not present

## 2020-02-07 DIAGNOSIS — R29701 NIHSS score 1: Secondary | ICD-10-CM | POA: Diagnosis not present

## 2020-02-07 DIAGNOSIS — Z881 Allergy status to other antibiotic agents status: Secondary | ICD-10-CM | POA: Diagnosis not present

## 2020-02-07 DIAGNOSIS — Z471 Aftercare following joint replacement surgery: Secondary | ICD-10-CM | POA: Diagnosis not present

## 2020-02-07 DIAGNOSIS — F4010 Social phobia, unspecified: Secondary | ICD-10-CM | POA: Diagnosis not present

## 2020-02-07 DIAGNOSIS — R0602 Shortness of breath: Secondary | ICD-10-CM | POA: Diagnosis not present

## 2020-02-07 DIAGNOSIS — K8020 Calculus of gallbladder without cholecystitis without obstruction: Secondary | ICD-10-CM | POA: Diagnosis not present

## 2020-02-07 DIAGNOSIS — M7062 Trochanteric bursitis, left hip: Secondary | ICD-10-CM | POA: Diagnosis not present

## 2020-02-07 DIAGNOSIS — Z9049 Acquired absence of other specified parts of digestive tract: Secondary | ICD-10-CM | POA: Diagnosis not present

## 2020-02-07 DIAGNOSIS — K635 Polyp of colon: Secondary | ICD-10-CM | POA: Diagnosis not present

## 2020-02-07 DIAGNOSIS — E2839 Other primary ovarian failure: Secondary | ICD-10-CM | POA: Diagnosis not present

## 2020-02-07 DIAGNOSIS — K633 Ulcer of intestine: Secondary | ICD-10-CM | POA: Diagnosis not present

## 2020-02-07 DIAGNOSIS — M2170 Unequal limb length (acquired), unspecified site: Secondary | ICD-10-CM | POA: Diagnosis not present

## 2020-02-07 DIAGNOSIS — E669 Obesity, unspecified: Secondary | ICD-10-CM | POA: Diagnosis not present

## 2020-02-07 DIAGNOSIS — I348 Other nonrheumatic mitral valve disorders: Secondary | ICD-10-CM | POA: Diagnosis not present

## 2020-02-07 DIAGNOSIS — Z833 Family history of diabetes mellitus: Secondary | ICD-10-CM | POA: Diagnosis not present

## 2020-02-07 DIAGNOSIS — D469 Myelodysplastic syndrome, unspecified: Secondary | ICD-10-CM | POA: Diagnosis not present

## 2020-02-07 DIAGNOSIS — R001 Bradycardia, unspecified: Secondary | ICD-10-CM | POA: Diagnosis not present

## 2020-02-07 DIAGNOSIS — J9612 Chronic respiratory failure with hypercapnia: Secondary | ICD-10-CM | POA: Diagnosis not present

## 2020-02-07 DIAGNOSIS — Z0000 Encounter for general adult medical examination without abnormal findings: Secondary | ICD-10-CM | POA: Diagnosis not present

## 2020-02-07 DIAGNOSIS — N138 Other obstructive and reflux uropathy: Secondary | ICD-10-CM | POA: Diagnosis not present

## 2020-02-07 DIAGNOSIS — G894 Chronic pain syndrome: Secondary | ICD-10-CM | POA: Diagnosis not present

## 2020-02-07 DIAGNOSIS — G992 Myelopathy in diseases classified elsewhere: Secondary | ICD-10-CM | POA: Diagnosis not present

## 2020-02-07 DIAGNOSIS — C8330 Diffuse large B-cell lymphoma, unspecified site: Secondary | ICD-10-CM | POA: Diagnosis not present

## 2020-02-07 DIAGNOSIS — M659 Synovitis and tenosynovitis, unspecified: Secondary | ICD-10-CM | POA: Diagnosis not present

## 2020-02-07 DIAGNOSIS — C641 Malignant neoplasm of right kidney, except renal pelvis: Secondary | ICD-10-CM | POA: Diagnosis not present

## 2020-02-07 DIAGNOSIS — E11610 Type 2 diabetes mellitus with diabetic neuropathic arthropathy: Secondary | ICD-10-CM | POA: Diagnosis not present

## 2020-02-07 DIAGNOSIS — I081 Rheumatic disorders of both mitral and tricuspid valves: Secondary | ICD-10-CM | POA: Diagnosis not present

## 2020-02-07 DIAGNOSIS — N186 End stage renal disease: Secondary | ICD-10-CM | POA: Diagnosis not present

## 2020-02-07 DIAGNOSIS — Z91041 Radiographic dye allergy status: Secondary | ICD-10-CM | POA: Diagnosis not present

## 2020-02-07 DIAGNOSIS — E43 Unspecified severe protein-calorie malnutrition: Secondary | ICD-10-CM | POA: Diagnosis not present

## 2020-02-07 DIAGNOSIS — E1121 Type 2 diabetes mellitus with diabetic nephropathy: Secondary | ICD-10-CM | POA: Diagnosis not present

## 2020-02-07 DIAGNOSIS — E1122 Type 2 diabetes mellitus with diabetic chronic kidney disease: Secondary | ICD-10-CM | POA: Diagnosis not present

## 2020-02-07 DIAGNOSIS — M9905 Segmental and somatic dysfunction of pelvic region: Secondary | ICD-10-CM | POA: Diagnosis not present

## 2020-02-07 DIAGNOSIS — M109 Gout, unspecified: Secondary | ICD-10-CM | POA: Diagnosis not present

## 2020-02-07 DIAGNOSIS — C44329 Squamous cell carcinoma of skin of other parts of face: Secondary | ICD-10-CM | POA: Diagnosis not present

## 2020-02-07 DIAGNOSIS — Z8249 Family history of ischemic heart disease and other diseases of the circulatory system: Secondary | ICD-10-CM | POA: Diagnosis not present

## 2020-02-07 DIAGNOSIS — H353221 Exudative age-related macular degeneration, left eye, with active choroidal neovascularization: Secondary | ICD-10-CM | POA: Diagnosis not present

## 2020-02-07 DIAGNOSIS — H43811 Vitreous degeneration, right eye: Secondary | ICD-10-CM | POA: Diagnosis not present

## 2020-02-07 DIAGNOSIS — Z9842 Cataract extraction status, left eye: Secondary | ICD-10-CM | POA: Diagnosis not present

## 2020-02-07 DIAGNOSIS — R319 Hematuria, unspecified: Secondary | ICD-10-CM | POA: Diagnosis not present

## 2020-02-07 DIAGNOSIS — I951 Orthostatic hypotension: Secondary | ICD-10-CM | POA: Diagnosis not present

## 2020-02-07 DIAGNOSIS — T464X5A Adverse effect of angiotensin-converting-enzyme inhibitors, initial encounter: Secondary | ICD-10-CM | POA: Diagnosis not present

## 2020-02-07 DIAGNOSIS — E782 Mixed hyperlipidemia: Secondary | ICD-10-CM | POA: Diagnosis not present

## 2020-02-07 DIAGNOSIS — Z8616 Personal history of COVID-19: Secondary | ICD-10-CM | POA: Diagnosis not present

## 2020-02-07 DIAGNOSIS — I10 Essential (primary) hypertension: Secondary | ICD-10-CM | POA: Diagnosis not present

## 2020-02-07 DIAGNOSIS — C44319 Basal cell carcinoma of skin of other parts of face: Secondary | ICD-10-CM | POA: Diagnosis not present

## 2020-02-07 DIAGNOSIS — Z08 Encounter for follow-up examination after completed treatment for malignant neoplasm: Secondary | ICD-10-CM | POA: Diagnosis not present

## 2020-02-07 DIAGNOSIS — R29898 Other symptoms and signs involving the musculoskeletal system: Secondary | ICD-10-CM | POA: Diagnosis not present

## 2020-02-07 DIAGNOSIS — I214 Non-ST elevation (NSTEMI) myocardial infarction: Secondary | ICD-10-CM | POA: Diagnosis not present

## 2020-02-07 DIAGNOSIS — M4802 Spinal stenosis, cervical region: Secondary | ICD-10-CM | POA: Diagnosis not present

## 2020-02-07 DIAGNOSIS — H9192 Unspecified hearing loss, left ear: Secondary | ICD-10-CM | POA: Diagnosis not present

## 2020-02-07 DIAGNOSIS — R570 Cardiogenic shock: Secondary | ICD-10-CM | POA: Diagnosis not present

## 2020-02-07 DIAGNOSIS — K640 First degree hemorrhoids: Secondary | ICD-10-CM | POA: Diagnosis not present

## 2020-02-07 DIAGNOSIS — M159 Polyosteoarthritis, unspecified: Secondary | ICD-10-CM | POA: Diagnosis not present

## 2020-02-07 DIAGNOSIS — Z801 Family history of malignant neoplasm of trachea, bronchus and lung: Secondary | ICD-10-CM | POA: Diagnosis not present

## 2020-02-07 DIAGNOSIS — E781 Pure hyperglyceridemia: Secondary | ICD-10-CM | POA: Diagnosis not present

## 2020-02-07 DIAGNOSIS — M67262 Synovial hypertrophy, not elsewhere classified, left lower leg: Secondary | ICD-10-CM | POA: Diagnosis not present

## 2020-02-07 DIAGNOSIS — G5632 Lesion of radial nerve, left upper limb: Secondary | ICD-10-CM | POA: Diagnosis not present

## 2020-02-07 DIAGNOSIS — G9341 Metabolic encephalopathy: Secondary | ICD-10-CM | POA: Diagnosis not present

## 2020-02-07 DIAGNOSIS — H2512 Age-related nuclear cataract, left eye: Secondary | ICD-10-CM | POA: Diagnosis not present

## 2020-02-07 DIAGNOSIS — M21372 Foot drop, left foot: Secondary | ICD-10-CM | POA: Diagnosis not present

## 2020-02-07 DIAGNOSIS — S32000D Wedge compression fracture of unspecified lumbar vertebra, subsequent encounter for fracture with routine healing: Secondary | ICD-10-CM | POA: Diagnosis not present

## 2020-02-07 DIAGNOSIS — Z7409 Other reduced mobility: Secondary | ICD-10-CM | POA: Diagnosis not present

## 2020-02-07 DIAGNOSIS — G441 Vascular headache, not elsewhere classified: Secondary | ICD-10-CM | POA: Diagnosis not present

## 2020-02-07 DIAGNOSIS — R41841 Cognitive communication deficit: Secondary | ICD-10-CM | POA: Diagnosis not present

## 2020-02-07 DIAGNOSIS — C7931 Secondary malignant neoplasm of brain: Secondary | ICD-10-CM | POA: Diagnosis not present

## 2020-02-07 DIAGNOSIS — C182 Malignant neoplasm of ascending colon: Secondary | ICD-10-CM | POA: Diagnosis not present

## 2020-02-07 DIAGNOSIS — Z888 Allergy status to other drugs, medicaments and biological substances status: Secondary | ICD-10-CM | POA: Diagnosis not present

## 2020-02-07 DIAGNOSIS — I120 Hypertensive chronic kidney disease with stage 5 chronic kidney disease or end stage renal disease: Secondary | ICD-10-CM | POA: Diagnosis not present

## 2020-02-07 DIAGNOSIS — I509 Heart failure, unspecified: Secondary | ICD-10-CM | POA: Diagnosis not present

## 2020-02-07 DIAGNOSIS — J45909 Unspecified asthma, uncomplicated: Secondary | ICD-10-CM | POA: Diagnosis not present

## 2020-02-07 DIAGNOSIS — H353211 Exudative age-related macular degeneration, right eye, with active choroidal neovascularization: Secondary | ICD-10-CM | POA: Diagnosis not present

## 2020-02-07 DIAGNOSIS — R1011 Right upper quadrant pain: Secondary | ICD-10-CM | POA: Diagnosis not present

## 2020-02-07 DIAGNOSIS — Z789 Other specified health status: Secondary | ICD-10-CM | POA: Diagnosis not present

## 2020-02-07 DIAGNOSIS — M75111 Incomplete rotator cuff tear or rupture of right shoulder, not specified as traumatic: Secondary | ICD-10-CM | POA: Diagnosis not present

## 2020-02-07 DIAGNOSIS — C50411 Malignant neoplasm of upper-outer quadrant of right female breast: Secondary | ICD-10-CM | POA: Diagnosis not present

## 2020-02-07 DIAGNOSIS — D631 Anemia in chronic kidney disease: Secondary | ICD-10-CM | POA: Diagnosis not present

## 2020-02-07 DIAGNOSIS — I739 Peripheral vascular disease, unspecified: Secondary | ICD-10-CM | POA: Diagnosis not present

## 2020-02-07 DIAGNOSIS — M25512 Pain in left shoulder: Secondary | ICD-10-CM | POA: Diagnosis not present

## 2020-02-07 DIAGNOSIS — R202 Paresthesia of skin: Secondary | ICD-10-CM | POA: Diagnosis not present

## 2020-02-07 DIAGNOSIS — S00521A Blister (nonthermal) of lip, initial encounter: Secondary | ICD-10-CM | POA: Diagnosis not present

## 2020-02-07 DIAGNOSIS — Z7984 Long term (current) use of oral hypoglycemic drugs: Secondary | ICD-10-CM | POA: Diagnosis not present

## 2020-02-07 DIAGNOSIS — K2950 Unspecified chronic gastritis without bleeding: Secondary | ICD-10-CM | POA: Diagnosis not present

## 2020-02-07 DIAGNOSIS — Z85820 Personal history of malignant melanoma of skin: Secondary | ICD-10-CM | POA: Diagnosis not present

## 2020-02-07 DIAGNOSIS — Z8546 Personal history of malignant neoplasm of prostate: Secondary | ICD-10-CM | POA: Diagnosis not present

## 2020-02-07 DIAGNOSIS — E7800 Pure hypercholesterolemia, unspecified: Secondary | ICD-10-CM | POA: Diagnosis not present

## 2020-02-07 DIAGNOSIS — F39 Unspecified mood [affective] disorder: Secondary | ICD-10-CM | POA: Diagnosis not present

## 2020-02-07 DIAGNOSIS — W010XXA Fall on same level from slipping, tripping and stumbling without subsequent striking against object, initial encounter: Secondary | ICD-10-CM | POA: Diagnosis not present

## 2020-02-07 DIAGNOSIS — J329 Chronic sinusitis, unspecified: Secondary | ICD-10-CM | POA: Diagnosis not present

## 2020-02-07 DIAGNOSIS — Z171 Estrogen receptor negative status [ER-]: Secondary | ICD-10-CM | POA: Diagnosis not present

## 2020-02-07 DIAGNOSIS — L812 Freckles: Secondary | ICD-10-CM | POA: Diagnosis not present

## 2020-02-07 DIAGNOSIS — M25612 Stiffness of left shoulder, not elsewhere classified: Secondary | ICD-10-CM | POA: Diagnosis not present

## 2020-02-07 DIAGNOSIS — D508 Other iron deficiency anemias: Secondary | ICD-10-CM | POA: Diagnosis not present

## 2020-02-07 DIAGNOSIS — I4589 Other specified conduction disorders: Secondary | ICD-10-CM | POA: Diagnosis not present

## 2020-02-07 DIAGNOSIS — J440 Chronic obstructive pulmonary disease with acute lower respiratory infection: Secondary | ICD-10-CM | POA: Diagnosis not present

## 2020-02-07 DIAGNOSIS — J309 Allergic rhinitis, unspecified: Secondary | ICD-10-CM | POA: Diagnosis not present

## 2020-02-07 DIAGNOSIS — J189 Pneumonia, unspecified organism: Secondary | ICD-10-CM | POA: Diagnosis not present

## 2020-02-07 DIAGNOSIS — Z91018 Allergy to other foods: Secondary | ICD-10-CM | POA: Diagnosis not present

## 2020-02-07 DIAGNOSIS — Z452 Encounter for adjustment and management of vascular access device: Secondary | ICD-10-CM | POA: Diagnosis not present

## 2020-02-07 DIAGNOSIS — Z9889 Other specified postprocedural states: Secondary | ICD-10-CM | POA: Diagnosis not present

## 2020-02-07 DIAGNOSIS — Z90722 Acquired absence of ovaries, bilateral: Secondary | ICD-10-CM | POA: Diagnosis not present

## 2020-02-07 DIAGNOSIS — I350 Nonrheumatic aortic (valve) stenosis: Secondary | ICD-10-CM | POA: Diagnosis not present

## 2020-02-07 DIAGNOSIS — E1129 Type 2 diabetes mellitus with other diabetic kidney complication: Secondary | ICD-10-CM | POA: Diagnosis not present

## 2020-02-07 DIAGNOSIS — K761 Chronic passive congestion of liver: Secondary | ICD-10-CM | POA: Diagnosis not present

## 2020-02-07 DIAGNOSIS — E1140 Type 2 diabetes mellitus with diabetic neuropathy, unspecified: Secondary | ICD-10-CM | POA: Diagnosis not present

## 2020-02-07 DIAGNOSIS — M810 Age-related osteoporosis without current pathological fracture: Secondary | ICD-10-CM | POA: Diagnosis not present

## 2020-02-07 DIAGNOSIS — R978 Other abnormal tumor markers: Secondary | ICD-10-CM | POA: Diagnosis not present

## 2020-02-07 DIAGNOSIS — I255 Ischemic cardiomyopathy: Secondary | ICD-10-CM | POA: Diagnosis not present

## 2020-02-07 DIAGNOSIS — M5030 Other cervical disc degeneration, unspecified cervical region: Secondary | ICD-10-CM | POA: Diagnosis not present

## 2020-02-07 DIAGNOSIS — G4733 Obstructive sleep apnea (adult) (pediatric): Secondary | ICD-10-CM | POA: Diagnosis not present

## 2020-02-07 DIAGNOSIS — Z952 Presence of prosthetic heart valve: Secondary | ICD-10-CM | POA: Diagnosis not present

## 2020-02-07 DIAGNOSIS — R002 Palpitations: Secondary | ICD-10-CM | POA: Diagnosis not present

## 2020-02-07 DIAGNOSIS — E7211 Homocystinuria: Secondary | ICD-10-CM | POA: Diagnosis not present

## 2020-02-07 DIAGNOSIS — D040 Carcinoma in situ of skin of lip: Secondary | ICD-10-CM | POA: Diagnosis not present

## 2020-02-07 DIAGNOSIS — Z20822 Contact with and (suspected) exposure to covid-19: Secondary | ICD-10-CM | POA: Diagnosis not present

## 2020-02-07 DIAGNOSIS — H35423 Microcystoid degeneration of retina, bilateral: Secondary | ICD-10-CM | POA: Diagnosis not present

## 2020-02-07 DIAGNOSIS — Z87891 Personal history of nicotine dependence: Secondary | ICD-10-CM | POA: Diagnosis not present

## 2020-02-07 DIAGNOSIS — S2249XA Multiple fractures of ribs, unspecified side, initial encounter for closed fracture: Secondary | ICD-10-CM | POA: Diagnosis not present

## 2020-02-07 DIAGNOSIS — M1990 Unspecified osteoarthritis, unspecified site: Secondary | ICD-10-CM | POA: Diagnosis not present

## 2020-02-07 DIAGNOSIS — Z9109 Other allergy status, other than to drugs and biological substances: Secondary | ICD-10-CM | POA: Diagnosis not present

## 2020-02-07 DIAGNOSIS — T83511A Infection and inflammatory reaction due to indwelling urethral catheter, initial encounter: Secondary | ICD-10-CM | POA: Diagnosis not present

## 2020-02-07 DIAGNOSIS — M48061 Spinal stenosis, lumbar region without neurogenic claudication: Secondary | ICD-10-CM | POA: Diagnosis not present

## 2020-02-07 DIAGNOSIS — D649 Anemia, unspecified: Secondary | ICD-10-CM | POA: Diagnosis not present

## 2020-02-07 DIAGNOSIS — N3281 Overactive bladder: Secondary | ICD-10-CM | POA: Diagnosis not present

## 2020-02-07 DIAGNOSIS — Z992 Dependence on renal dialysis: Secondary | ICD-10-CM | POA: Diagnosis not present

## 2020-02-07 DIAGNOSIS — C50919 Malignant neoplasm of unspecified site of unspecified female breast: Secondary | ICD-10-CM | POA: Diagnosis not present

## 2020-02-07 DIAGNOSIS — I6523 Occlusion and stenosis of bilateral carotid arteries: Secondary | ICD-10-CM | POA: Diagnosis not present

## 2020-02-07 DIAGNOSIS — I472 Ventricular tachycardia: Secondary | ICD-10-CM | POA: Diagnosis not present

## 2020-02-07 DIAGNOSIS — Z91040 Latex allergy status: Secondary | ICD-10-CM | POA: Diagnosis not present

## 2020-02-07 DIAGNOSIS — M5412 Radiculopathy, cervical region: Secondary | ICD-10-CM | POA: Diagnosis not present

## 2020-02-07 DIAGNOSIS — R519 Headache, unspecified: Secondary | ICD-10-CM | POA: Diagnosis not present

## 2020-02-07 DIAGNOSIS — J4520 Mild intermittent asthma, uncomplicated: Secondary | ICD-10-CM | POA: Diagnosis not present

## 2020-02-07 DIAGNOSIS — Z853 Personal history of malignant neoplasm of breast: Secondary | ICD-10-CM | POA: Diagnosis not present

## 2020-02-07 DIAGNOSIS — Z006 Encounter for examination for normal comparison and control in clinical research program: Secondary | ICD-10-CM | POA: Diagnosis not present

## 2020-02-07 DIAGNOSIS — G453 Amaurosis fugax: Secondary | ICD-10-CM | POA: Diagnosis not present

## 2020-02-07 DIAGNOSIS — S2242XA Multiple fractures of ribs, left side, initial encounter for closed fracture: Secondary | ICD-10-CM | POA: Diagnosis not present

## 2020-02-07 DIAGNOSIS — N170 Acute kidney failure with tubular necrosis: Secondary | ICD-10-CM | POA: Diagnosis not present

## 2020-02-07 DIAGNOSIS — R627 Adult failure to thrive: Secondary | ICD-10-CM | POA: Diagnosis not present

## 2020-02-07 DIAGNOSIS — Z85118 Personal history of other malignant neoplasm of bronchus and lung: Secondary | ICD-10-CM | POA: Diagnosis not present

## 2020-02-07 DIAGNOSIS — I160 Hypertensive urgency: Secondary | ICD-10-CM | POA: Diagnosis not present

## 2020-02-07 DIAGNOSIS — K219 Gastro-esophageal reflux disease without esophagitis: Secondary | ICD-10-CM | POA: Diagnosis not present

## 2020-02-07 DIAGNOSIS — S93422D Sprain of deltoid ligament of left ankle, subsequent encounter: Secondary | ICD-10-CM | POA: Diagnosis not present

## 2020-02-07 DIAGNOSIS — F331 Major depressive disorder, recurrent, moderate: Secondary | ICD-10-CM | POA: Diagnosis not present

## 2020-02-07 DIAGNOSIS — N390 Urinary tract infection, site not specified: Secondary | ICD-10-CM | POA: Diagnosis not present

## 2020-02-07 DIAGNOSIS — F411 Generalized anxiety disorder: Secondary | ICD-10-CM | POA: Diagnosis not present

## 2020-02-07 DIAGNOSIS — I482 Chronic atrial fibrillation, unspecified: Secondary | ICD-10-CM | POA: Diagnosis not present

## 2020-02-07 DIAGNOSIS — I4891 Unspecified atrial fibrillation: Secondary | ICD-10-CM | POA: Diagnosis not present

## 2020-02-07 DIAGNOSIS — M80052A Age-related osteoporosis with current pathological fracture, left femur, initial encounter for fracture: Secondary | ICD-10-CM | POA: Diagnosis not present

## 2020-02-07 DIAGNOSIS — J45991 Cough variant asthma: Secondary | ICD-10-CM | POA: Diagnosis not present

## 2020-02-07 DIAGNOSIS — R109 Unspecified abdominal pain: Secondary | ICD-10-CM | POA: Diagnosis not present

## 2020-02-07 DIAGNOSIS — R42 Dizziness and giddiness: Secondary | ICD-10-CM | POA: Diagnosis not present

## 2020-02-07 DIAGNOSIS — G4489 Other headache syndrome: Secondary | ICD-10-CM | POA: Diagnosis not present

## 2020-02-07 DIAGNOSIS — J9811 Atelectasis: Secondary | ICD-10-CM | POA: Diagnosis not present

## 2020-02-07 DIAGNOSIS — K8689 Other specified diseases of pancreas: Secondary | ICD-10-CM | POA: Diagnosis not present

## 2020-02-07 DIAGNOSIS — I5030 Unspecified diastolic (congestive) heart failure: Secondary | ICD-10-CM | POA: Diagnosis not present

## 2020-02-08 DIAGNOSIS — H2513 Age-related nuclear cataract, bilateral: Secondary | ICD-10-CM | POA: Diagnosis not present

## 2020-02-08 DIAGNOSIS — M5412 Radiculopathy, cervical region: Secondary | ICD-10-CM | POA: Diagnosis not present

## 2020-02-08 DIAGNOSIS — M25531 Pain in right wrist: Secondary | ICD-10-CM | POA: Diagnosis not present

## 2020-02-08 DIAGNOSIS — Z789 Other specified health status: Secondary | ICD-10-CM | POA: Diagnosis not present

## 2020-02-08 DIAGNOSIS — G5601 Carpal tunnel syndrome, right upper limb: Secondary | ICD-10-CM | POA: Diagnosis not present

## 2020-02-08 DIAGNOSIS — N3281 Overactive bladder: Secondary | ICD-10-CM | POA: Diagnosis not present

## 2020-02-08 DIAGNOSIS — R0789 Other chest pain: Secondary | ICD-10-CM | POA: Diagnosis not present

## 2020-02-08 DIAGNOSIS — Z0000 Encounter for general adult medical examination without abnormal findings: Secondary | ICD-10-CM | POA: Diagnosis not present

## 2020-02-08 DIAGNOSIS — N189 Chronic kidney disease, unspecified: Secondary | ICD-10-CM | POA: Diagnosis not present

## 2020-02-08 DIAGNOSIS — M25552 Pain in left hip: Secondary | ICD-10-CM | POA: Diagnosis not present

## 2020-02-08 DIAGNOSIS — Z0001 Encounter for general adult medical examination with abnormal findings: Secondary | ICD-10-CM | POA: Diagnosis not present

## 2020-02-08 DIAGNOSIS — S42202D Unspecified fracture of upper end of left humerus, subsequent encounter for fracture with routine healing: Secondary | ICD-10-CM | POA: Diagnosis not present

## 2020-02-08 DIAGNOSIS — C799 Secondary malignant neoplasm of unspecified site: Secondary | ICD-10-CM | POA: Diagnosis not present

## 2020-02-08 DIAGNOSIS — Z03818 Encounter for observation for suspected exposure to other biological agents ruled out: Secondary | ICD-10-CM | POA: Diagnosis not present

## 2020-02-08 DIAGNOSIS — D125 Benign neoplasm of sigmoid colon: Secondary | ICD-10-CM | POA: Diagnosis not present

## 2020-02-08 DIAGNOSIS — E1169 Type 2 diabetes mellitus with other specified complication: Secondary | ICD-10-CM | POA: Diagnosis not present

## 2020-02-08 DIAGNOSIS — I480 Paroxysmal atrial fibrillation: Secondary | ICD-10-CM | POA: Diagnosis not present

## 2020-02-08 DIAGNOSIS — R9431 Abnormal electrocardiogram [ECG] [EKG]: Secondary | ICD-10-CM | POA: Diagnosis not present

## 2020-02-08 DIAGNOSIS — I483 Typical atrial flutter: Secondary | ICD-10-CM | POA: Diagnosis not present

## 2020-02-08 DIAGNOSIS — E1142 Type 2 diabetes mellitus with diabetic polyneuropathy: Secondary | ICD-10-CM | POA: Diagnosis not present

## 2020-02-08 DIAGNOSIS — K648 Other hemorrhoids: Secondary | ICD-10-CM | POA: Diagnosis not present

## 2020-02-08 DIAGNOSIS — I6522 Occlusion and stenosis of left carotid artery: Secondary | ICD-10-CM | POA: Diagnosis not present

## 2020-02-08 DIAGNOSIS — Z96651 Presence of right artificial knee joint: Secondary | ICD-10-CM | POA: Diagnosis not present

## 2020-02-08 DIAGNOSIS — I159 Secondary hypertension, unspecified: Secondary | ICD-10-CM | POA: Diagnosis not present

## 2020-02-08 DIAGNOSIS — Z6832 Body mass index (BMI) 32.0-32.9, adult: Secondary | ICD-10-CM | POA: Diagnosis not present

## 2020-02-08 DIAGNOSIS — K5909 Other constipation: Secondary | ICD-10-CM | POA: Diagnosis not present

## 2020-02-08 DIAGNOSIS — M25642 Stiffness of left hand, not elsewhere classified: Secondary | ICD-10-CM | POA: Diagnosis not present

## 2020-02-08 DIAGNOSIS — I8311 Varicose veins of right lower extremity with inflammation: Secondary | ICD-10-CM | POA: Diagnosis not present

## 2020-02-08 DIAGNOSIS — Z85060 Personal history of malignant carcinoid tumor of small intestine: Secondary | ICD-10-CM | POA: Diagnosis not present

## 2020-02-08 DIAGNOSIS — Z125 Encounter for screening for malignant neoplasm of prostate: Secondary | ICD-10-CM | POA: Diagnosis not present

## 2020-02-08 DIAGNOSIS — D72828 Other elevated white blood cell count: Secondary | ICD-10-CM | POA: Diagnosis not present

## 2020-02-08 DIAGNOSIS — L820 Inflamed seborrheic keratosis: Secondary | ICD-10-CM | POA: Diagnosis not present

## 2020-02-08 DIAGNOSIS — E119 Type 2 diabetes mellitus without complications: Secondary | ICD-10-CM | POA: Diagnosis not present

## 2020-02-08 DIAGNOSIS — R293 Abnormal posture: Secondary | ICD-10-CM | POA: Diagnosis not present

## 2020-02-08 DIAGNOSIS — R300 Dysuria: Secondary | ICD-10-CM | POA: Diagnosis not present

## 2020-02-08 DIAGNOSIS — R8290 Unspecified abnormal findings in urine: Secondary | ICD-10-CM | POA: Diagnosis not present

## 2020-02-08 DIAGNOSIS — D696 Thrombocytopenia, unspecified: Secondary | ICD-10-CM | POA: Diagnosis not present

## 2020-02-08 DIAGNOSIS — L280 Lichen simplex chronicus: Secondary | ICD-10-CM | POA: Diagnosis not present

## 2020-02-08 DIAGNOSIS — K2270 Barrett's esophagus without dysplasia: Secondary | ICD-10-CM | POA: Diagnosis not present

## 2020-02-08 DIAGNOSIS — M25611 Stiffness of right shoulder, not elsewhere classified: Secondary | ICD-10-CM | POA: Diagnosis not present

## 2020-02-08 DIAGNOSIS — M25622 Stiffness of left elbow, not elsewhere classified: Secondary | ICD-10-CM | POA: Diagnosis not present

## 2020-02-08 DIAGNOSIS — M542 Cervicalgia: Secondary | ICD-10-CM | POA: Diagnosis not present

## 2020-02-08 DIAGNOSIS — R7989 Other specified abnormal findings of blood chemistry: Secondary | ICD-10-CM | POA: Diagnosis not present

## 2020-02-08 DIAGNOSIS — J9801 Acute bronchospasm: Secondary | ICD-10-CM | POA: Diagnosis not present

## 2020-02-08 DIAGNOSIS — C61 Malignant neoplasm of prostate: Secondary | ICD-10-CM | POA: Diagnosis not present

## 2020-02-08 DIAGNOSIS — R001 Bradycardia, unspecified: Secondary | ICD-10-CM | POA: Diagnosis not present

## 2020-02-08 DIAGNOSIS — I498 Other specified cardiac arrhythmias: Secondary | ICD-10-CM | POA: Diagnosis not present

## 2020-02-08 DIAGNOSIS — Z713 Dietary counseling and surveillance: Secondary | ICD-10-CM | POA: Diagnosis not present

## 2020-02-08 DIAGNOSIS — D6489 Other specified anemias: Secondary | ICD-10-CM | POA: Diagnosis not present

## 2020-02-08 DIAGNOSIS — D508 Other iron deficiency anemias: Secondary | ICD-10-CM | POA: Diagnosis not present

## 2020-02-08 DIAGNOSIS — N179 Acute kidney failure, unspecified: Secondary | ICD-10-CM | POA: Diagnosis not present

## 2020-02-08 DIAGNOSIS — G4733 Obstructive sleep apnea (adult) (pediatric): Secondary | ICD-10-CM | POA: Diagnosis not present

## 2020-02-08 DIAGNOSIS — M8448XD Pathological fracture, other site, subsequent encounter for fracture with routine healing: Secondary | ICD-10-CM | POA: Diagnosis not present

## 2020-02-08 DIAGNOSIS — J42 Unspecified chronic bronchitis: Secondary | ICD-10-CM | POA: Diagnosis not present

## 2020-02-08 DIAGNOSIS — E1149 Type 2 diabetes mellitus with other diabetic neurological complication: Secondary | ICD-10-CM | POA: Diagnosis not present

## 2020-02-08 DIAGNOSIS — G301 Alzheimer's disease with late onset: Secondary | ICD-10-CM | POA: Diagnosis not present

## 2020-02-08 DIAGNOSIS — G4700 Insomnia, unspecified: Secondary | ICD-10-CM | POA: Diagnosis not present

## 2020-02-08 DIAGNOSIS — D6869 Other thrombophilia: Secondary | ICD-10-CM | POA: Diagnosis not present

## 2020-02-08 DIAGNOSIS — E559 Vitamin D deficiency, unspecified: Secondary | ICD-10-CM | POA: Diagnosis not present

## 2020-02-08 DIAGNOSIS — H26493 Other secondary cataract, bilateral: Secondary | ICD-10-CM | POA: Diagnosis not present

## 2020-02-08 DIAGNOSIS — C50511 Malignant neoplasm of lower-outer quadrant of right female breast: Secondary | ICD-10-CM | POA: Diagnosis not present

## 2020-02-08 DIAGNOSIS — B009 Herpesviral infection, unspecified: Secondary | ICD-10-CM | POA: Diagnosis not present

## 2020-02-08 DIAGNOSIS — E213 Hyperparathyroidism, unspecified: Secondary | ICD-10-CM | POA: Diagnosis not present

## 2020-02-08 DIAGNOSIS — R682 Dry mouth, unspecified: Secondary | ICD-10-CM | POA: Diagnosis not present

## 2020-02-08 DIAGNOSIS — M064 Inflammatory polyarthropathy: Secondary | ICD-10-CM | POA: Diagnosis not present

## 2020-02-08 DIAGNOSIS — K7689 Other specified diseases of liver: Secondary | ICD-10-CM | POA: Diagnosis not present

## 2020-02-08 DIAGNOSIS — M1990 Unspecified osteoarthritis, unspecified site: Secondary | ICD-10-CM | POA: Diagnosis not present

## 2020-02-08 DIAGNOSIS — I2510 Atherosclerotic heart disease of native coronary artery without angina pectoris: Secondary | ICD-10-CM | POA: Diagnosis not present

## 2020-02-08 DIAGNOSIS — F332 Major depressive disorder, recurrent severe without psychotic features: Secondary | ICD-10-CM | POA: Diagnosis not present

## 2020-02-08 DIAGNOSIS — Z66 Do not resuscitate: Secondary | ICD-10-CM | POA: Diagnosis not present

## 2020-02-08 DIAGNOSIS — K7031 Alcoholic cirrhosis of liver with ascites: Secondary | ICD-10-CM | POA: Diagnosis not present

## 2020-02-08 DIAGNOSIS — M5126 Other intervertebral disc displacement, lumbar region: Secondary | ICD-10-CM | POA: Diagnosis not present

## 2020-02-08 DIAGNOSIS — U071 COVID-19: Secondary | ICD-10-CM | POA: Diagnosis not present

## 2020-02-08 DIAGNOSIS — Z1283 Encounter for screening for malignant neoplasm of skin: Secondary | ICD-10-CM | POA: Diagnosis not present

## 2020-02-08 DIAGNOSIS — G4489 Other headache syndrome: Secondary | ICD-10-CM | POA: Diagnosis not present

## 2020-02-08 DIAGNOSIS — E1139 Type 2 diabetes mellitus with other diabetic ophthalmic complication: Secondary | ICD-10-CM | POA: Diagnosis not present

## 2020-02-08 DIAGNOSIS — F4310 Post-traumatic stress disorder, unspecified: Secondary | ICD-10-CM | POA: Diagnosis not present

## 2020-02-08 DIAGNOSIS — M1991 Primary osteoarthritis, unspecified site: Secondary | ICD-10-CM | POA: Diagnosis not present

## 2020-02-08 DIAGNOSIS — D72819 Decreased white blood cell count, unspecified: Secondary | ICD-10-CM | POA: Diagnosis not present

## 2020-02-08 DIAGNOSIS — M5416 Radiculopathy, lumbar region: Secondary | ICD-10-CM | POA: Diagnosis not present

## 2020-02-08 DIAGNOSIS — L97422 Non-pressure chronic ulcer of left heel and midfoot with fat layer exposed: Secondary | ICD-10-CM | POA: Diagnosis not present

## 2020-02-08 DIAGNOSIS — Q602 Renal agenesis, unspecified: Secondary | ICD-10-CM | POA: Diagnosis not present

## 2020-02-08 DIAGNOSIS — I69392 Facial weakness following cerebral infarction: Secondary | ICD-10-CM | POA: Diagnosis not present

## 2020-02-08 DIAGNOSIS — I208 Other forms of angina pectoris: Secondary | ICD-10-CM | POA: Diagnosis not present

## 2020-02-08 DIAGNOSIS — J9600 Acute respiratory failure, unspecified whether with hypoxia or hypercapnia: Secondary | ICD-10-CM | POA: Diagnosis not present

## 2020-02-08 DIAGNOSIS — N3943 Post-void dribbling: Secondary | ICD-10-CM | POA: Diagnosis not present

## 2020-02-08 DIAGNOSIS — M84451A Pathological fracture, right femur, initial encounter for fracture: Secondary | ICD-10-CM | POA: Diagnosis not present

## 2020-02-08 DIAGNOSIS — S72301A Unspecified fracture of shaft of right femur, initial encounter for closed fracture: Secondary | ICD-10-CM | POA: Diagnosis not present

## 2020-02-08 DIAGNOSIS — J45909 Unspecified asthma, uncomplicated: Secondary | ICD-10-CM | POA: Diagnosis not present

## 2020-02-08 DIAGNOSIS — R109 Unspecified abdominal pain: Secondary | ICD-10-CM | POA: Diagnosis not present

## 2020-02-08 DIAGNOSIS — C7951 Secondary malignant neoplasm of bone: Secondary | ICD-10-CM | POA: Diagnosis not present

## 2020-02-08 DIAGNOSIS — K8020 Calculus of gallbladder without cholecystitis without obstruction: Secondary | ICD-10-CM | POA: Diagnosis not present

## 2020-02-08 DIAGNOSIS — R41841 Cognitive communication deficit: Secondary | ICD-10-CM | POA: Diagnosis not present

## 2020-02-08 DIAGNOSIS — E1121 Type 2 diabetes mellitus with diabetic nephropathy: Secondary | ICD-10-CM | POA: Diagnosis not present

## 2020-02-08 DIAGNOSIS — M4722 Other spondylosis with radiculopathy, cervical region: Secondary | ICD-10-CM | POA: Diagnosis not present

## 2020-02-08 DIAGNOSIS — I82402 Acute embolism and thrombosis of unspecified deep veins of left lower extremity: Secondary | ICD-10-CM | POA: Diagnosis not present

## 2020-02-08 DIAGNOSIS — Z4803 Encounter for change or removal of drains: Secondary | ICD-10-CM | POA: Diagnosis not present

## 2020-02-08 DIAGNOSIS — M1049 Other secondary gout, multiple sites: Secondary | ICD-10-CM | POA: Diagnosis not present

## 2020-02-08 DIAGNOSIS — N50812 Left testicular pain: Secondary | ICD-10-CM | POA: Diagnosis not present

## 2020-02-08 DIAGNOSIS — M8580 Other specified disorders of bone density and structure, unspecified site: Secondary | ICD-10-CM | POA: Diagnosis not present

## 2020-02-08 DIAGNOSIS — D509 Iron deficiency anemia, unspecified: Secondary | ICD-10-CM | POA: Diagnosis not present

## 2020-02-08 DIAGNOSIS — H409 Unspecified glaucoma: Secondary | ICD-10-CM | POA: Diagnosis not present

## 2020-02-08 DIAGNOSIS — M47812 Spondylosis without myelopathy or radiculopathy, cervical region: Secondary | ICD-10-CM | POA: Diagnosis not present

## 2020-02-08 DIAGNOSIS — D631 Anemia in chronic kidney disease: Secondary | ICD-10-CM | POA: Diagnosis not present

## 2020-02-08 DIAGNOSIS — G119 Hereditary ataxia, unspecified: Secondary | ICD-10-CM | POA: Diagnosis not present

## 2020-02-08 DIAGNOSIS — M4317 Spondylolisthesis, lumbosacral region: Secondary | ICD-10-CM | POA: Diagnosis not present

## 2020-02-08 DIAGNOSIS — J4520 Mild intermittent asthma, uncomplicated: Secondary | ICD-10-CM | POA: Diagnosis not present

## 2020-02-08 DIAGNOSIS — F5101 Primary insomnia: Secondary | ICD-10-CM | POA: Diagnosis not present

## 2020-02-08 DIAGNOSIS — M25512 Pain in left shoulder: Secondary | ICD-10-CM | POA: Diagnosis not present

## 2020-02-08 DIAGNOSIS — M3500 Sicca syndrome, unspecified: Secondary | ICD-10-CM | POA: Diagnosis not present

## 2020-02-08 DIAGNOSIS — H2511 Age-related nuclear cataract, right eye: Secondary | ICD-10-CM | POA: Diagnosis not present

## 2020-02-08 DIAGNOSIS — G620 Drug-induced polyneuropathy: Secondary | ICD-10-CM | POA: Diagnosis not present

## 2020-02-08 DIAGNOSIS — M791 Myalgia, unspecified site: Secondary | ICD-10-CM | POA: Diagnosis not present

## 2020-02-08 DIAGNOSIS — C7989 Secondary malignant neoplasm of other specified sites: Secondary | ICD-10-CM | POA: Diagnosis not present

## 2020-02-08 DIAGNOSIS — M9905 Segmental and somatic dysfunction of pelvic region: Secondary | ICD-10-CM | POA: Diagnosis not present

## 2020-02-08 DIAGNOSIS — W19XXXA Unspecified fall, initial encounter: Secondary | ICD-10-CM | POA: Diagnosis not present

## 2020-02-08 DIAGNOSIS — I5023 Acute on chronic systolic (congestive) heart failure: Secondary | ICD-10-CM | POA: Diagnosis not present

## 2020-02-08 DIAGNOSIS — R82998 Other abnormal findings in urine: Secondary | ICD-10-CM | POA: Diagnosis not present

## 2020-02-08 DIAGNOSIS — F17298 Nicotine dependence, other tobacco product, with other nicotine-induced disorders: Secondary | ICD-10-CM | POA: Diagnosis not present

## 2020-02-08 DIAGNOSIS — K810 Acute cholecystitis: Secondary | ICD-10-CM | POA: Diagnosis not present

## 2020-02-08 DIAGNOSIS — Z4789 Encounter for other orthopedic aftercare: Secondary | ICD-10-CM | POA: Diagnosis not present

## 2020-02-08 DIAGNOSIS — E7800 Pure hypercholesterolemia, unspecified: Secondary | ICD-10-CM | POA: Diagnosis not present

## 2020-02-08 DIAGNOSIS — K52839 Microscopic colitis, unspecified: Secondary | ICD-10-CM | POA: Diagnosis not present

## 2020-02-08 DIAGNOSIS — Z961 Presence of intraocular lens: Secondary | ICD-10-CM | POA: Diagnosis not present

## 2020-02-08 DIAGNOSIS — F418 Other specified anxiety disorders: Secondary | ICD-10-CM | POA: Diagnosis not present

## 2020-02-08 DIAGNOSIS — L570 Actinic keratosis: Secondary | ICD-10-CM | POA: Diagnosis not present

## 2020-02-08 DIAGNOSIS — F3342 Major depressive disorder, recurrent, in full remission: Secondary | ICD-10-CM | POA: Diagnosis not present

## 2020-02-08 DIAGNOSIS — R7303 Prediabetes: Secondary | ICD-10-CM | POA: Diagnosis not present

## 2020-02-08 DIAGNOSIS — H60543 Acute eczematoid otitis externa, bilateral: Secondary | ICD-10-CM | POA: Diagnosis not present

## 2020-02-08 DIAGNOSIS — C787 Secondary malignant neoplasm of liver and intrahepatic bile duct: Secondary | ICD-10-CM | POA: Diagnosis not present

## 2020-02-08 DIAGNOSIS — M25461 Effusion, right knee: Secondary | ICD-10-CM | POA: Diagnosis not present

## 2020-02-08 DIAGNOSIS — J4521 Mild intermittent asthma with (acute) exacerbation: Secondary | ICD-10-CM | POA: Diagnosis not present

## 2020-02-08 DIAGNOSIS — H524 Presbyopia: Secondary | ICD-10-CM | POA: Diagnosis not present

## 2020-02-08 DIAGNOSIS — I69320 Aphasia following cerebral infarction: Secondary | ICD-10-CM | POA: Diagnosis not present

## 2020-02-08 DIAGNOSIS — R197 Diarrhea, unspecified: Secondary | ICD-10-CM | POA: Diagnosis not present

## 2020-02-08 DIAGNOSIS — M545 Low back pain: Secondary | ICD-10-CM | POA: Diagnosis not present

## 2020-02-08 DIAGNOSIS — E785 Hyperlipidemia, unspecified: Secondary | ICD-10-CM | POA: Diagnosis not present

## 2020-02-08 DIAGNOSIS — F324 Major depressive disorder, single episode, in partial remission: Secondary | ICD-10-CM | POA: Diagnosis not present

## 2020-02-08 DIAGNOSIS — Z515 Encounter for palliative care: Secondary | ICD-10-CM | POA: Diagnosis not present

## 2020-02-08 DIAGNOSIS — Z170 Estrogen receptor positive status [ER+]: Secondary | ICD-10-CM | POA: Diagnosis not present

## 2020-02-08 DIAGNOSIS — Z95828 Presence of other vascular implants and grafts: Secondary | ICD-10-CM | POA: Diagnosis not present

## 2020-02-08 DIAGNOSIS — K319 Disease of stomach and duodenum, unspecified: Secondary | ICD-10-CM | POA: Diagnosis not present

## 2020-02-08 DIAGNOSIS — L814 Other melanin hyperpigmentation: Secondary | ICD-10-CM | POA: Diagnosis not present

## 2020-02-08 DIAGNOSIS — N63 Unspecified lump in unspecified breast: Secondary | ICD-10-CM | POA: Diagnosis not present

## 2020-02-08 DIAGNOSIS — L97411 Non-pressure chronic ulcer of right heel and midfoot limited to breakdown of skin: Secondary | ICD-10-CM | POA: Diagnosis not present

## 2020-02-08 DIAGNOSIS — B965 Pseudomonas (aeruginosa) (mallei) (pseudomallei) as the cause of diseases classified elsewhere: Secondary | ICD-10-CM | POA: Diagnosis not present

## 2020-02-08 DIAGNOSIS — F0390 Unspecified dementia without behavioral disturbance: Secondary | ICD-10-CM | POA: Diagnosis not present

## 2020-02-08 DIAGNOSIS — M25631 Stiffness of right wrist, not elsewhere classified: Secondary | ICD-10-CM | POA: Diagnosis not present

## 2020-02-08 DIAGNOSIS — R21 Rash and other nonspecific skin eruption: Secondary | ICD-10-CM | POA: Diagnosis not present

## 2020-02-08 DIAGNOSIS — Z853 Personal history of malignant neoplasm of breast: Secondary | ICD-10-CM | POA: Diagnosis not present

## 2020-02-08 DIAGNOSIS — M797 Fibromyalgia: Secondary | ICD-10-CM | POA: Diagnosis not present

## 2020-02-08 DIAGNOSIS — K4090 Unilateral inguinal hernia, without obstruction or gangrene, not specified as recurrent: Secondary | ICD-10-CM | POA: Diagnosis not present

## 2020-02-08 DIAGNOSIS — F411 Generalized anxiety disorder: Secondary | ICD-10-CM | POA: Diagnosis not present

## 2020-02-08 DIAGNOSIS — M79662 Pain in left lower leg: Secondary | ICD-10-CM | POA: Diagnosis not present

## 2020-02-08 DIAGNOSIS — R072 Precordial pain: Secondary | ICD-10-CM | POA: Diagnosis not present

## 2020-02-08 DIAGNOSIS — E871 Hypo-osmolality and hyponatremia: Secondary | ICD-10-CM | POA: Diagnosis not present

## 2020-02-08 DIAGNOSIS — K5730 Diverticulosis of large intestine without perforation or abscess without bleeding: Secondary | ICD-10-CM | POA: Diagnosis not present

## 2020-02-08 DIAGNOSIS — G890 Central pain syndrome: Secondary | ICD-10-CM | POA: Diagnosis not present

## 2020-02-08 DIAGNOSIS — Z1389 Encounter for screening for other disorder: Secondary | ICD-10-CM | POA: Diagnosis not present

## 2020-02-08 DIAGNOSIS — D6859 Other primary thrombophilia: Secondary | ICD-10-CM | POA: Diagnosis not present

## 2020-02-08 DIAGNOSIS — A419 Sepsis, unspecified organism: Secondary | ICD-10-CM | POA: Diagnosis not present

## 2020-02-08 DIAGNOSIS — E11622 Type 2 diabetes mellitus with other skin ulcer: Secondary | ICD-10-CM | POA: Diagnosis not present

## 2020-02-08 DIAGNOSIS — R32 Unspecified urinary incontinence: Secondary | ICD-10-CM | POA: Diagnosis not present

## 2020-02-08 DIAGNOSIS — L821 Other seborrheic keratosis: Secondary | ICD-10-CM | POA: Diagnosis not present

## 2020-02-08 DIAGNOSIS — E11621 Type 2 diabetes mellitus with foot ulcer: Secondary | ICD-10-CM | POA: Diagnosis not present

## 2020-02-08 DIAGNOSIS — M069 Rheumatoid arthritis, unspecified: Secondary | ICD-10-CM | POA: Diagnosis not present

## 2020-02-08 DIAGNOSIS — F0391 Unspecified dementia with behavioral disturbance: Secondary | ICD-10-CM | POA: Diagnosis not present

## 2020-02-08 DIAGNOSIS — Z794 Long term (current) use of insulin: Secondary | ICD-10-CM | POA: Diagnosis not present

## 2020-02-08 DIAGNOSIS — H259 Unspecified age-related cataract: Secondary | ICD-10-CM | POA: Diagnosis not present

## 2020-02-08 DIAGNOSIS — Z8041 Family history of malignant neoplasm of ovary: Secondary | ICD-10-CM | POA: Diagnosis not present

## 2020-02-08 DIAGNOSIS — E291 Testicular hypofunction: Secondary | ICD-10-CM | POA: Diagnosis not present

## 2020-02-08 DIAGNOSIS — R945 Abnormal results of liver function studies: Secondary | ICD-10-CM | POA: Diagnosis not present

## 2020-02-08 DIAGNOSIS — M9902 Segmental and somatic dysfunction of thoracic region: Secondary | ICD-10-CM | POA: Diagnosis not present

## 2020-02-08 DIAGNOSIS — F4321 Adjustment disorder with depressed mood: Secondary | ICD-10-CM | POA: Diagnosis not present

## 2020-02-08 DIAGNOSIS — B369 Superficial mycosis, unspecified: Secondary | ICD-10-CM | POA: Diagnosis not present

## 2020-02-08 DIAGNOSIS — Z1231 Encounter for screening mammogram for malignant neoplasm of breast: Secondary | ICD-10-CM | POA: Diagnosis not present

## 2020-02-08 DIAGNOSIS — F325 Major depressive disorder, single episode, in full remission: Secondary | ICD-10-CM | POA: Diagnosis not present

## 2020-02-08 DIAGNOSIS — I69351 Hemiplegia and hemiparesis following cerebral infarction affecting right dominant side: Secondary | ICD-10-CM | POA: Diagnosis not present

## 2020-02-08 DIAGNOSIS — G1221 Amyotrophic lateral sclerosis: Secondary | ICD-10-CM | POA: Diagnosis not present

## 2020-02-08 DIAGNOSIS — D563 Thalassemia minor: Secondary | ICD-10-CM | POA: Diagnosis not present

## 2020-02-08 DIAGNOSIS — J441 Chronic obstructive pulmonary disease with (acute) exacerbation: Secondary | ICD-10-CM | POA: Diagnosis not present

## 2020-02-08 DIAGNOSIS — J301 Allergic rhinitis due to pollen: Secondary | ICD-10-CM | POA: Diagnosis not present

## 2020-02-08 DIAGNOSIS — S76011D Strain of muscle, fascia and tendon of right hip, subsequent encounter: Secondary | ICD-10-CM | POA: Diagnosis not present

## 2020-02-08 DIAGNOSIS — N182 Chronic kidney disease, stage 2 (mild): Secondary | ICD-10-CM | POA: Diagnosis not present

## 2020-02-08 DIAGNOSIS — M62551 Muscle wasting and atrophy, not elsewhere classified, right thigh: Secondary | ICD-10-CM | POA: Diagnosis not present

## 2020-02-08 DIAGNOSIS — E1129 Type 2 diabetes mellitus with other diabetic kidney complication: Secondary | ICD-10-CM | POA: Diagnosis not present

## 2020-02-08 DIAGNOSIS — R102 Pelvic and perineal pain: Secondary | ICD-10-CM | POA: Diagnosis not present

## 2020-02-08 DIAGNOSIS — K7460 Unspecified cirrhosis of liver: Secondary | ICD-10-CM | POA: Diagnosis not present

## 2020-02-08 DIAGNOSIS — S72041D Displaced fracture of base of neck of right femur, subsequent encounter for closed fracture with routine healing: Secondary | ICD-10-CM | POA: Diagnosis not present

## 2020-02-08 DIAGNOSIS — Z86718 Personal history of other venous thrombosis and embolism: Secondary | ICD-10-CM | POA: Diagnosis not present

## 2020-02-08 DIAGNOSIS — J449 Chronic obstructive pulmonary disease, unspecified: Secondary | ICD-10-CM | POA: Diagnosis not present

## 2020-02-08 DIAGNOSIS — M152 Bouchard's nodes (with arthropathy): Secondary | ICD-10-CM | POA: Diagnosis not present

## 2020-02-08 DIAGNOSIS — R739 Hyperglycemia, unspecified: Secondary | ICD-10-CM | POA: Diagnosis not present

## 2020-02-08 DIAGNOSIS — R351 Nocturia: Secondary | ICD-10-CM | POA: Diagnosis not present

## 2020-02-08 DIAGNOSIS — Z8701 Personal history of pneumonia (recurrent): Secondary | ICD-10-CM | POA: Diagnosis not present

## 2020-02-08 DIAGNOSIS — M79605 Pain in left leg: Secondary | ICD-10-CM | POA: Diagnosis not present

## 2020-02-08 DIAGNOSIS — E890 Postprocedural hypothyroidism: Secondary | ICD-10-CM | POA: Diagnosis not present

## 2020-02-08 DIAGNOSIS — J40 Bronchitis, not specified as acute or chronic: Secondary | ICD-10-CM | POA: Diagnosis not present

## 2020-02-08 DIAGNOSIS — Z8042 Family history of malignant neoplasm of prostate: Secondary | ICD-10-CM | POA: Diagnosis not present

## 2020-02-08 DIAGNOSIS — I739 Peripheral vascular disease, unspecified: Secondary | ICD-10-CM | POA: Diagnosis not present

## 2020-02-08 DIAGNOSIS — I252 Old myocardial infarction: Secondary | ICD-10-CM | POA: Diagnosis not present

## 2020-02-08 DIAGNOSIS — Z23 Encounter for immunization: Secondary | ICD-10-CM | POA: Diagnosis not present

## 2020-02-08 DIAGNOSIS — J3089 Other allergic rhinitis: Secondary | ICD-10-CM | POA: Diagnosis not present

## 2020-02-08 DIAGNOSIS — M7542 Impingement syndrome of left shoulder: Secondary | ICD-10-CM | POA: Diagnosis not present

## 2020-02-08 DIAGNOSIS — Z20828 Contact with and (suspected) exposure to other viral communicable diseases: Secondary | ICD-10-CM | POA: Diagnosis not present

## 2020-02-08 DIAGNOSIS — Z8546 Personal history of malignant neoplasm of prostate: Secondary | ICD-10-CM | POA: Diagnosis not present

## 2020-02-08 DIAGNOSIS — M5136 Other intervertebral disc degeneration, lumbar region: Secondary | ICD-10-CM | POA: Diagnosis not present

## 2020-02-08 DIAGNOSIS — M810 Age-related osteoporosis without current pathological fracture: Secondary | ICD-10-CM | POA: Diagnosis not present

## 2020-02-08 DIAGNOSIS — F322 Major depressive disorder, single episode, severe without psychotic features: Secondary | ICD-10-CM | POA: Diagnosis not present

## 2020-02-08 DIAGNOSIS — K922 Gastrointestinal hemorrhage, unspecified: Secondary | ICD-10-CM | POA: Diagnosis not present

## 2020-02-08 DIAGNOSIS — Z20822 Contact with and (suspected) exposure to covid-19: Secondary | ICD-10-CM | POA: Diagnosis not present

## 2020-02-08 DIAGNOSIS — Z7409 Other reduced mobility: Secondary | ICD-10-CM | POA: Diagnosis not present

## 2020-02-08 DIAGNOSIS — E538 Deficiency of other specified B group vitamins: Secondary | ICD-10-CM | POA: Diagnosis not present

## 2020-02-08 DIAGNOSIS — R9389 Abnormal findings on diagnostic imaging of other specified body structures: Secondary | ICD-10-CM | POA: Diagnosis not present

## 2020-02-08 DIAGNOSIS — M25612 Stiffness of left shoulder, not elsewhere classified: Secondary | ICD-10-CM | POA: Diagnosis not present

## 2020-02-08 DIAGNOSIS — J189 Pneumonia, unspecified organism: Secondary | ICD-10-CM | POA: Diagnosis not present

## 2020-02-08 DIAGNOSIS — M48061 Spinal stenosis, lumbar region without neurogenic claudication: Secondary | ICD-10-CM | POA: Diagnosis not present

## 2020-02-08 DIAGNOSIS — R42 Dizziness and giddiness: Secondary | ICD-10-CM | POA: Diagnosis not present

## 2020-02-08 DIAGNOSIS — S143XXD Injury of brachial plexus, subsequent encounter: Secondary | ICD-10-CM | POA: Diagnosis not present

## 2020-02-08 DIAGNOSIS — Z6830 Body mass index (BMI) 30.0-30.9, adult: Secondary | ICD-10-CM | POA: Diagnosis not present

## 2020-02-08 DIAGNOSIS — C8510 Unspecified B-cell lymphoma, unspecified site: Secondary | ICD-10-CM | POA: Diagnosis not present

## 2020-02-08 DIAGNOSIS — F1110 Opioid abuse, uncomplicated: Secondary | ICD-10-CM | POA: Diagnosis not present

## 2020-02-08 DIAGNOSIS — Z7982 Long term (current) use of aspirin: Secondary | ICD-10-CM | POA: Diagnosis not present

## 2020-02-08 DIAGNOSIS — J342 Deviated nasal septum: Secondary | ICD-10-CM | POA: Diagnosis not present

## 2020-02-08 DIAGNOSIS — C801 Malignant (primary) neoplasm, unspecified: Secondary | ICD-10-CM | POA: Diagnosis not present

## 2020-02-08 DIAGNOSIS — R0902 Hypoxemia: Secondary | ICD-10-CM | POA: Diagnosis not present

## 2020-02-08 DIAGNOSIS — F339 Major depressive disorder, recurrent, unspecified: Secondary | ICD-10-CM | POA: Diagnosis not present

## 2020-02-08 DIAGNOSIS — M25559 Pain in unspecified hip: Secondary | ICD-10-CM | POA: Diagnosis not present

## 2020-02-08 DIAGNOSIS — G8929 Other chronic pain: Secondary | ICD-10-CM | POA: Diagnosis not present

## 2020-02-08 DIAGNOSIS — R972 Elevated prostate specific antigen [PSA]: Secondary | ICD-10-CM | POA: Diagnosis not present

## 2020-02-08 DIAGNOSIS — M25572 Pain in left ankle and joints of left foot: Secondary | ICD-10-CM | POA: Diagnosis not present

## 2020-02-08 DIAGNOSIS — G9341 Metabolic encephalopathy: Secondary | ICD-10-CM | POA: Diagnosis not present

## 2020-02-08 DIAGNOSIS — D2271 Melanocytic nevi of right lower limb, including hip: Secondary | ICD-10-CM | POA: Diagnosis not present

## 2020-02-08 DIAGNOSIS — M461 Sacroiliitis, not elsewhere classified: Secondary | ICD-10-CM | POA: Diagnosis not present

## 2020-02-08 DIAGNOSIS — E1140 Type 2 diabetes mellitus with diabetic neuropathy, unspecified: Secondary | ICD-10-CM | POA: Diagnosis not present

## 2020-02-08 DIAGNOSIS — E6609 Other obesity due to excess calories: Secondary | ICD-10-CM | POA: Diagnosis not present

## 2020-02-08 DIAGNOSIS — Z7952 Long term (current) use of systemic steroids: Secondary | ICD-10-CM | POA: Diagnosis not present

## 2020-02-08 DIAGNOSIS — S46012D Strain of muscle(s) and tendon(s) of the rotator cuff of left shoulder, subsequent encounter: Secondary | ICD-10-CM | POA: Diagnosis not present

## 2020-02-08 DIAGNOSIS — Z803 Family history of malignant neoplasm of breast: Secondary | ICD-10-CM | POA: Diagnosis not present

## 2020-02-08 DIAGNOSIS — Z96652 Presence of left artificial knee joint: Secondary | ICD-10-CM | POA: Diagnosis not present

## 2020-02-08 DIAGNOSIS — N3010 Interstitial cystitis (chronic) without hematuria: Secondary | ICD-10-CM | POA: Diagnosis not present

## 2020-02-08 DIAGNOSIS — Z96642 Presence of left artificial hip joint: Secondary | ICD-10-CM | POA: Diagnosis not present

## 2020-02-08 DIAGNOSIS — N393 Stress incontinence (female) (male): Secondary | ICD-10-CM | POA: Diagnosis not present

## 2020-02-08 DIAGNOSIS — N390 Urinary tract infection, site not specified: Secondary | ICD-10-CM | POA: Diagnosis not present

## 2020-02-08 DIAGNOSIS — C50912 Malignant neoplasm of unspecified site of left female breast: Secondary | ICD-10-CM | POA: Diagnosis not present

## 2020-02-08 DIAGNOSIS — K52831 Collagenous colitis: Secondary | ICD-10-CM | POA: Diagnosis not present

## 2020-02-08 DIAGNOSIS — R221 Localized swelling, mass and lump, neck: Secondary | ICD-10-CM | POA: Diagnosis not present

## 2020-02-08 DIAGNOSIS — R451 Restlessness and agitation: Secondary | ICD-10-CM | POA: Diagnosis not present

## 2020-02-08 DIAGNOSIS — I421 Obstructive hypertrophic cardiomyopathy: Secondary | ICD-10-CM | POA: Diagnosis not present

## 2020-02-08 DIAGNOSIS — M62838 Other muscle spasm: Secondary | ICD-10-CM | POA: Diagnosis not present

## 2020-02-08 DIAGNOSIS — E11610 Type 2 diabetes mellitus with diabetic neuropathic arthropathy: Secondary | ICD-10-CM | POA: Diagnosis not present

## 2020-02-08 DIAGNOSIS — M4807 Spinal stenosis, lumbosacral region: Secondary | ICD-10-CM | POA: Diagnosis not present

## 2020-02-08 DIAGNOSIS — K219 Gastro-esophageal reflux disease without esophagitis: Secondary | ICD-10-CM | POA: Diagnosis not present

## 2020-02-08 DIAGNOSIS — Z7984 Long term (current) use of oral hypoglycemic drugs: Secondary | ICD-10-CM | POA: Diagnosis not present

## 2020-02-08 DIAGNOSIS — L89620 Pressure ulcer of left heel, unstageable: Secondary | ICD-10-CM | POA: Diagnosis not present

## 2020-02-08 DIAGNOSIS — Z5181 Encounter for therapeutic drug level monitoring: Secondary | ICD-10-CM | POA: Diagnosis not present

## 2020-02-08 DIAGNOSIS — Z09 Encounter for follow-up examination after completed treatment for conditions other than malignant neoplasm: Secondary | ICD-10-CM | POA: Diagnosis not present

## 2020-02-08 DIAGNOSIS — R1084 Generalized abdominal pain: Secondary | ICD-10-CM | POA: Diagnosis not present

## 2020-02-08 DIAGNOSIS — K625 Hemorrhage of anus and rectum: Secondary | ICD-10-CM | POA: Diagnosis not present

## 2020-02-08 DIAGNOSIS — R419 Unspecified symptoms and signs involving cognitive functions and awareness: Secondary | ICD-10-CM | POA: Diagnosis not present

## 2020-02-08 DIAGNOSIS — D0512 Intraductal carcinoma in situ of left breast: Secondary | ICD-10-CM | POA: Diagnosis not present

## 2020-02-08 DIAGNOSIS — H25812 Combined forms of age-related cataract, left eye: Secondary | ICD-10-CM | POA: Diagnosis not present

## 2020-02-08 DIAGNOSIS — Z9889 Other specified postprocedural states: Secondary | ICD-10-CM | POA: Diagnosis not present

## 2020-02-08 DIAGNOSIS — F419 Anxiety disorder, unspecified: Secondary | ICD-10-CM | POA: Diagnosis not present

## 2020-02-08 DIAGNOSIS — R296 Repeated falls: Secondary | ICD-10-CM | POA: Diagnosis not present

## 2020-02-08 DIAGNOSIS — H6243 Otitis externa in other diseases classified elsewhere, bilateral: Secondary | ICD-10-CM | POA: Diagnosis not present

## 2020-02-08 DIAGNOSIS — Z9181 History of falling: Secondary | ICD-10-CM | POA: Diagnosis not present

## 2020-02-08 DIAGNOSIS — H6123 Impacted cerumen, bilateral: Secondary | ICD-10-CM | POA: Diagnosis not present

## 2020-02-08 DIAGNOSIS — M19011 Primary osteoarthritis, right shoulder: Secondary | ICD-10-CM | POA: Diagnosis not present

## 2020-02-08 DIAGNOSIS — K760 Fatty (change of) liver, not elsewhere classified: Secondary | ICD-10-CM | POA: Diagnosis not present

## 2020-02-08 DIAGNOSIS — D123 Benign neoplasm of transverse colon: Secondary | ICD-10-CM | POA: Diagnosis not present

## 2020-02-08 DIAGNOSIS — J309 Allergic rhinitis, unspecified: Secondary | ICD-10-CM | POA: Diagnosis not present

## 2020-02-08 DIAGNOSIS — M19049 Primary osteoarthritis, unspecified hand: Secondary | ICD-10-CM | POA: Diagnosis not present

## 2020-02-08 DIAGNOSIS — M25551 Pain in right hip: Secondary | ICD-10-CM | POA: Diagnosis not present

## 2020-02-08 DIAGNOSIS — E1122 Type 2 diabetes mellitus with diabetic chronic kidney disease: Secondary | ICD-10-CM | POA: Diagnosis not present

## 2020-02-08 DIAGNOSIS — M47816 Spondylosis without myelopathy or radiculopathy, lumbar region: Secondary | ICD-10-CM | POA: Diagnosis not present

## 2020-02-08 DIAGNOSIS — R1011 Right upper quadrant pain: Secondary | ICD-10-CM | POA: Diagnosis not present

## 2020-02-08 DIAGNOSIS — Z90710 Acquired absence of both cervix and uterus: Secondary | ICD-10-CM | POA: Diagnosis not present

## 2020-02-08 DIAGNOSIS — Z791 Long term (current) use of non-steroidal anti-inflammatories (NSAID): Secondary | ICD-10-CM | POA: Diagnosis not present

## 2020-02-08 DIAGNOSIS — R52 Pain, unspecified: Secondary | ICD-10-CM | POA: Diagnosis not present

## 2020-02-08 DIAGNOSIS — G250 Essential tremor: Secondary | ICD-10-CM | POA: Diagnosis not present

## 2020-02-08 DIAGNOSIS — R918 Other nonspecific abnormal finding of lung field: Secondary | ICD-10-CM | POA: Diagnosis not present

## 2020-02-08 DIAGNOSIS — E860 Dehydration: Secondary | ICD-10-CM | POA: Diagnosis not present

## 2020-02-08 DIAGNOSIS — R269 Unspecified abnormalities of gait and mobility: Secondary | ICD-10-CM | POA: Diagnosis not present

## 2020-02-08 DIAGNOSIS — K5900 Constipation, unspecified: Secondary | ICD-10-CM | POA: Diagnosis not present

## 2020-02-08 DIAGNOSIS — N1831 Chronic kidney disease, stage 3a: Secondary | ICD-10-CM | POA: Diagnosis not present

## 2020-02-08 DIAGNOSIS — R110 Nausea: Secondary | ICD-10-CM | POA: Diagnosis not present

## 2020-02-08 DIAGNOSIS — M7061 Trochanteric bursitis, right hip: Secondary | ICD-10-CM | POA: Diagnosis not present

## 2020-02-08 DIAGNOSIS — N183 Chronic kidney disease, stage 3 unspecified: Secondary | ICD-10-CM | POA: Diagnosis not present

## 2020-02-08 DIAGNOSIS — Z7902 Long term (current) use of antithrombotics/antiplatelets: Secondary | ICD-10-CM | POA: Diagnosis not present

## 2020-02-08 DIAGNOSIS — I712 Thoracic aortic aneurysm, without rupture: Secondary | ICD-10-CM | POA: Diagnosis not present

## 2020-02-08 DIAGNOSIS — M1711 Unilateral primary osteoarthritis, right knee: Secondary | ICD-10-CM | POA: Diagnosis not present

## 2020-02-08 DIAGNOSIS — Z471 Aftercare following joint replacement surgery: Secondary | ICD-10-CM | POA: Diagnosis not present

## 2020-02-08 DIAGNOSIS — R079 Chest pain, unspecified: Secondary | ICD-10-CM | POA: Diagnosis not present

## 2020-02-08 DIAGNOSIS — F17210 Nicotine dependence, cigarettes, uncomplicated: Secondary | ICD-10-CM | POA: Diagnosis not present

## 2020-02-08 DIAGNOSIS — M6281 Muscle weakness (generalized): Secondary | ICD-10-CM | POA: Diagnosis not present

## 2020-02-08 DIAGNOSIS — E039 Hypothyroidism, unspecified: Secondary | ICD-10-CM | POA: Diagnosis not present

## 2020-02-08 DIAGNOSIS — I700 Atherosclerosis of aorta: Secondary | ICD-10-CM | POA: Diagnosis not present

## 2020-02-08 DIAGNOSIS — F330 Major depressive disorder, recurrent, mild: Secondary | ICD-10-CM | POA: Diagnosis not present

## 2020-02-08 DIAGNOSIS — Z7901 Long term (current) use of anticoagulants: Secondary | ICD-10-CM | POA: Diagnosis not present

## 2020-02-08 DIAGNOSIS — K8000 Calculus of gallbladder with acute cholecystitis without obstruction: Secondary | ICD-10-CM | POA: Diagnosis not present

## 2020-02-08 DIAGNOSIS — N200 Calculus of kidney: Secondary | ICD-10-CM | POA: Diagnosis not present

## 2020-02-08 DIAGNOSIS — H1045 Other chronic allergic conjunctivitis: Secondary | ICD-10-CM | POA: Diagnosis not present

## 2020-02-08 DIAGNOSIS — E782 Mixed hyperlipidemia: Secondary | ICD-10-CM | POA: Diagnosis not present

## 2020-02-08 DIAGNOSIS — M7989 Other specified soft tissue disorders: Secondary | ICD-10-CM | POA: Diagnosis not present

## 2020-02-08 DIAGNOSIS — H903 Sensorineural hearing loss, bilateral: Secondary | ICD-10-CM | POA: Diagnosis not present

## 2020-02-08 DIAGNOSIS — Z800 Family history of malignant neoplasm of digestive organs: Secondary | ICD-10-CM | POA: Diagnosis not present

## 2020-02-08 DIAGNOSIS — L578 Other skin changes due to chronic exposure to nonionizing radiation: Secondary | ICD-10-CM | POA: Diagnosis not present

## 2020-02-08 DIAGNOSIS — M19012 Primary osteoarthritis, left shoulder: Secondary | ICD-10-CM | POA: Diagnosis not present

## 2020-02-08 DIAGNOSIS — N1832 Chronic kidney disease, stage 3b: Secondary | ICD-10-CM | POA: Diagnosis not present

## 2020-02-08 DIAGNOSIS — I5022 Chronic systolic (congestive) heart failure: Secondary | ICD-10-CM | POA: Diagnosis not present

## 2020-02-08 DIAGNOSIS — L853 Xerosis cutis: Secondary | ICD-10-CM | POA: Diagnosis not present

## 2020-02-08 DIAGNOSIS — N3001 Acute cystitis with hematuria: Secondary | ICD-10-CM | POA: Diagnosis not present

## 2020-02-08 DIAGNOSIS — R5383 Other fatigue: Secondary | ICD-10-CM | POA: Diagnosis not present

## 2020-02-08 DIAGNOSIS — R911 Solitary pulmonary nodule: Secondary | ICD-10-CM | POA: Diagnosis not present

## 2020-02-08 DIAGNOSIS — R262 Difficulty in walking, not elsewhere classified: Secondary | ICD-10-CM | POA: Diagnosis not present

## 2020-02-08 DIAGNOSIS — E049 Nontoxic goiter, unspecified: Secondary | ICD-10-CM | POA: Diagnosis not present

## 2020-02-08 DIAGNOSIS — Z79899 Other long term (current) drug therapy: Secondary | ICD-10-CM | POA: Diagnosis not present

## 2020-02-08 DIAGNOSIS — R232 Flushing: Secondary | ICD-10-CM | POA: Diagnosis not present

## 2020-02-08 DIAGNOSIS — R5381 Other malaise: Secondary | ICD-10-CM | POA: Diagnosis not present

## 2020-02-08 DIAGNOSIS — N111 Chronic obstructive pyelonephritis: Secondary | ICD-10-CM | POA: Diagnosis not present

## 2020-02-08 DIAGNOSIS — M6389 Disorders of muscle in diseases classified elsewhere, multiple sites: Secondary | ICD-10-CM | POA: Diagnosis not present

## 2020-02-08 DIAGNOSIS — Z79891 Long term (current) use of opiate analgesic: Secondary | ICD-10-CM | POA: Diagnosis not present

## 2020-02-08 DIAGNOSIS — I63529 Cerebral infarction due to unspecified occlusion or stenosis of unspecified anterior cerebral artery: Secondary | ICD-10-CM | POA: Diagnosis not present

## 2020-02-08 DIAGNOSIS — N132 Hydronephrosis with renal and ureteral calculous obstruction: Secondary | ICD-10-CM | POA: Diagnosis not present

## 2020-02-08 DIAGNOSIS — N400 Enlarged prostate without lower urinary tract symptoms: Secondary | ICD-10-CM | POA: Diagnosis not present

## 2020-02-08 DIAGNOSIS — E46 Unspecified protein-calorie malnutrition: Secondary | ICD-10-CM | POA: Diagnosis not present

## 2020-02-08 DIAGNOSIS — R2689 Other abnormalities of gait and mobility: Secondary | ICD-10-CM | POA: Diagnosis not present

## 2020-02-08 DIAGNOSIS — M9904 Segmental and somatic dysfunction of sacral region: Secondary | ICD-10-CM | POA: Diagnosis not present

## 2020-02-08 DIAGNOSIS — F3132 Bipolar disorder, current episode depressed, moderate: Secondary | ICD-10-CM | POA: Diagnosis not present

## 2020-02-08 DIAGNOSIS — K580 Irritable bowel syndrome with diarrhea: Secondary | ICD-10-CM | POA: Diagnosis not present

## 2020-02-08 DIAGNOSIS — M79652 Pain in left thigh: Secondary | ICD-10-CM | POA: Diagnosis not present

## 2020-02-08 DIAGNOSIS — Z171 Estrogen receptor negative status [ER-]: Secondary | ICD-10-CM | POA: Diagnosis not present

## 2020-02-08 DIAGNOSIS — Z5111 Encounter for antineoplastic chemotherapy: Secondary | ICD-10-CM | POA: Diagnosis not present

## 2020-02-08 DIAGNOSIS — M5417 Radiculopathy, lumbosacral region: Secondary | ICD-10-CM | POA: Diagnosis not present

## 2020-02-08 DIAGNOSIS — H539 Unspecified visual disturbance: Secondary | ICD-10-CM | POA: Diagnosis not present

## 2020-02-08 DIAGNOSIS — G479 Sleep disorder, unspecified: Secondary | ICD-10-CM | POA: Diagnosis not present

## 2020-02-08 DIAGNOSIS — C9210 Chronic myeloid leukemia, BCR/ABL-positive, not having achieved remission: Secondary | ICD-10-CM | POA: Diagnosis not present

## 2020-02-08 DIAGNOSIS — R531 Weakness: Secondary | ICD-10-CM | POA: Diagnosis not present

## 2020-02-08 DIAGNOSIS — R112 Nausea with vomiting, unspecified: Secondary | ICD-10-CM | POA: Diagnosis not present

## 2020-02-08 DIAGNOSIS — F3341 Major depressive disorder, recurrent, in partial remission: Secondary | ICD-10-CM | POA: Diagnosis not present

## 2020-02-08 DIAGNOSIS — M7981 Nontraumatic hematoma of soft tissue: Secondary | ICD-10-CM | POA: Diagnosis not present

## 2020-02-08 DIAGNOSIS — Z6829 Body mass index (BMI) 29.0-29.9, adult: Secondary | ICD-10-CM | POA: Diagnosis not present

## 2020-02-08 DIAGNOSIS — I110 Hypertensive heart disease with heart failure: Secondary | ICD-10-CM | POA: Diagnosis not present

## 2020-02-08 DIAGNOSIS — N184 Chronic kidney disease, stage 4 (severe): Secondary | ICD-10-CM | POA: Diagnosis not present

## 2020-02-08 DIAGNOSIS — R195 Other fecal abnormalities: Secondary | ICD-10-CM | POA: Diagnosis not present

## 2020-02-08 DIAGNOSIS — Z9013 Acquired absence of bilateral breasts and nipples: Secondary | ICD-10-CM | POA: Diagnosis not present

## 2020-02-08 DIAGNOSIS — E875 Hyperkalemia: Secondary | ICD-10-CM | POA: Diagnosis not present

## 2020-02-08 DIAGNOSIS — I10 Essential (primary) hypertension: Secondary | ICD-10-CM | POA: Diagnosis not present

## 2020-02-08 DIAGNOSIS — Z466 Encounter for fitting and adjustment of urinary device: Secondary | ICD-10-CM | POA: Diagnosis not present

## 2020-02-08 DIAGNOSIS — R600 Localized edema: Secondary | ICD-10-CM | POA: Diagnosis not present

## 2020-02-08 DIAGNOSIS — M25462 Effusion, left knee: Secondary | ICD-10-CM | POA: Diagnosis not present

## 2020-02-08 DIAGNOSIS — S82851A Displaced trimalleolar fracture of right lower leg, initial encounter for closed fracture: Secondary | ICD-10-CM | POA: Diagnosis not present

## 2020-02-08 DIAGNOSIS — M109 Gout, unspecified: Secondary | ICD-10-CM | POA: Diagnosis not present

## 2020-02-08 DIAGNOSIS — G894 Chronic pain syndrome: Secondary | ICD-10-CM | POA: Diagnosis not present

## 2020-02-08 DIAGNOSIS — B182 Chronic viral hepatitis C: Secondary | ICD-10-CM | POA: Diagnosis not present

## 2020-02-08 DIAGNOSIS — D34 Benign neoplasm of thyroid gland: Secondary | ICD-10-CM | POA: Diagnosis not present

## 2020-02-08 DIAGNOSIS — M2550 Pain in unspecified joint: Secondary | ICD-10-CM | POA: Diagnosis not present

## 2020-02-08 DIAGNOSIS — F1020 Alcohol dependence, uncomplicated: Secondary | ICD-10-CM | POA: Diagnosis not present

## 2020-02-08 DIAGNOSIS — E1165 Type 2 diabetes mellitus with hyperglycemia: Secondary | ICD-10-CM | POA: Diagnosis not present

## 2020-02-08 DIAGNOSIS — R0602 Shortness of breath: Secondary | ICD-10-CM | POA: Diagnosis not present

## 2020-02-08 DIAGNOSIS — R2681 Unsteadiness on feet: Secondary | ICD-10-CM | POA: Diagnosis not present

## 2020-02-08 DIAGNOSIS — E7849 Other hyperlipidemia: Secondary | ICD-10-CM | POA: Diagnosis not present

## 2020-02-08 DIAGNOSIS — M79631 Pain in right forearm: Secondary | ICD-10-CM | POA: Diagnosis not present

## 2020-02-08 DIAGNOSIS — Z9114 Patient's other noncompliance with medication regimen: Secondary | ICD-10-CM | POA: Diagnosis not present

## 2020-02-08 DIAGNOSIS — R040 Epistaxis: Secondary | ICD-10-CM | POA: Diagnosis not present

## 2020-02-08 DIAGNOSIS — R54 Age-related physical debility: Secondary | ICD-10-CM | POA: Diagnosis not present

## 2020-02-08 DIAGNOSIS — Z006 Encounter for examination for normal comparison and control in clinical research program: Secondary | ICD-10-CM | POA: Diagnosis not present

## 2020-02-08 DIAGNOSIS — R3915 Urgency of urination: Secondary | ICD-10-CM | POA: Diagnosis not present

## 2020-02-08 DIAGNOSIS — R748 Abnormal levels of other serum enzymes: Secondary | ICD-10-CM | POA: Diagnosis not present

## 2020-02-08 DIAGNOSIS — C549 Malignant neoplasm of corpus uteri, unspecified: Secondary | ICD-10-CM | POA: Diagnosis not present

## 2020-02-08 DIAGNOSIS — M25511 Pain in right shoulder: Secondary | ICD-10-CM | POA: Diagnosis not present

## 2020-02-08 DIAGNOSIS — L02214 Cutaneous abscess of groin: Secondary | ICD-10-CM | POA: Diagnosis not present

## 2020-02-08 DIAGNOSIS — M8588 Other specified disorders of bone density and structure, other site: Secondary | ICD-10-CM | POA: Diagnosis not present

## 2020-02-08 DIAGNOSIS — D120 Benign neoplasm of cecum: Secondary | ICD-10-CM | POA: Diagnosis not present

## 2020-02-08 DIAGNOSIS — I272 Pulmonary hypertension, unspecified: Secondary | ICD-10-CM | POA: Diagnosis not present

## 2020-02-08 DIAGNOSIS — I509 Heart failure, unspecified: Secondary | ICD-10-CM | POA: Diagnosis not present

## 2020-02-08 DIAGNOSIS — M75112 Incomplete rotator cuff tear or rupture of left shoulder, not specified as traumatic: Secondary | ICD-10-CM | POA: Diagnosis not present

## 2020-02-08 DIAGNOSIS — R000 Tachycardia, unspecified: Secondary | ICD-10-CM | POA: Diagnosis not present

## 2020-02-08 DIAGNOSIS — Z96641 Presence of right artificial hip joint: Secondary | ICD-10-CM | POA: Diagnosis not present

## 2020-02-08 DIAGNOSIS — I130 Hypertensive heart and chronic kidney disease with heart failure and stage 1 through stage 4 chronic kidney disease, or unspecified chronic kidney disease: Secondary | ICD-10-CM | POA: Diagnosis not present

## 2020-02-08 DIAGNOSIS — J302 Other seasonal allergic rhinitis: Secondary | ICD-10-CM | POA: Diagnosis not present

## 2020-02-08 DIAGNOSIS — N951 Menopausal and female climacteric states: Secondary | ICD-10-CM | POA: Diagnosis not present

## 2020-02-08 DIAGNOSIS — J440 Chronic obstructive pulmonary disease with acute lower respiratory infection: Secondary | ICD-10-CM | POA: Diagnosis not present

## 2020-02-08 DIAGNOSIS — I129 Hypertensive chronic kidney disease with stage 1 through stage 4 chronic kidney disease, or unspecified chronic kidney disease: Secondary | ICD-10-CM | POA: Diagnosis not present

## 2020-02-08 DIAGNOSIS — R1013 Epigastric pain: Secondary | ICD-10-CM | POA: Diagnosis not present

## 2020-02-08 DIAGNOSIS — Z79811 Long term (current) use of aromatase inhibitors: Secondary | ICD-10-CM | POA: Diagnosis not present

## 2020-02-08 DIAGNOSIS — H40013 Open angle with borderline findings, low risk, bilateral: Secondary | ICD-10-CM | POA: Diagnosis not present

## 2020-02-08 DIAGNOSIS — E781 Pure hyperglyceridemia: Secondary | ICD-10-CM | POA: Diagnosis not present

## 2020-02-08 DIAGNOSIS — M7541 Impingement syndrome of right shoulder: Secondary | ICD-10-CM | POA: Diagnosis not present

## 2020-02-08 DIAGNOSIS — R260 Ataxic gait: Secondary | ICD-10-CM | POA: Diagnosis not present

## 2020-02-08 DIAGNOSIS — E611 Iron deficiency: Secondary | ICD-10-CM | POA: Diagnosis not present

## 2020-02-08 DIAGNOSIS — N3090 Cystitis, unspecified without hematuria: Secondary | ICD-10-CM | POA: Diagnosis not present

## 2020-02-08 DIAGNOSIS — M436 Torticollis: Secondary | ICD-10-CM | POA: Diagnosis not present

## 2020-02-08 DIAGNOSIS — J439 Emphysema, unspecified: Secondary | ICD-10-CM | POA: Diagnosis not present

## 2020-02-08 DIAGNOSIS — D4100 Neoplasm of uncertain behavior of unspecified kidney: Secondary | ICD-10-CM | POA: Diagnosis not present

## 2020-02-08 DIAGNOSIS — Z1152 Encounter for screening for COVID-19: Secondary | ICD-10-CM | POA: Diagnosis not present

## 2020-02-08 DIAGNOSIS — E7439 Other disorders of intestinal carbohydrate absorption: Secondary | ICD-10-CM | POA: Diagnosis not present

## 2020-02-08 DIAGNOSIS — I951 Orthostatic hypotension: Secondary | ICD-10-CM | POA: Diagnosis not present

## 2020-02-08 DIAGNOSIS — D1801 Hemangioma of skin and subcutaneous tissue: Secondary | ICD-10-CM | POA: Diagnosis not present

## 2020-02-08 DIAGNOSIS — C44311 Basal cell carcinoma of skin of nose: Secondary | ICD-10-CM | POA: Diagnosis not present

## 2020-02-08 DIAGNOSIS — N178 Other acute kidney failure: Secondary | ICD-10-CM | POA: Diagnosis not present

## 2020-02-08 DIAGNOSIS — M47897 Other spondylosis, lumbosacral region: Secondary | ICD-10-CM | POA: Diagnosis not present

## 2020-02-08 DIAGNOSIS — M9901 Segmental and somatic dysfunction of cervical region: Secondary | ICD-10-CM | POA: Diagnosis not present

## 2020-02-08 DIAGNOSIS — D122 Benign neoplasm of ascending colon: Secondary | ICD-10-CM | POA: Diagnosis not present

## 2020-02-08 DIAGNOSIS — R05 Cough: Secondary | ICD-10-CM | POA: Diagnosis not present

## 2020-02-08 DIAGNOSIS — E531 Pyridoxine deficiency: Secondary | ICD-10-CM | POA: Diagnosis not present

## 2020-02-08 DIAGNOSIS — D582 Other hemoglobinopathies: Secondary | ICD-10-CM | POA: Diagnosis not present

## 2020-02-08 DIAGNOSIS — M19041 Primary osteoarthritis, right hand: Secondary | ICD-10-CM | POA: Diagnosis not present

## 2020-02-08 DIAGNOSIS — Z87891 Personal history of nicotine dependence: Secondary | ICD-10-CM | POA: Diagnosis not present

## 2020-02-08 DIAGNOSIS — D492 Neoplasm of unspecified behavior of bone, soft tissue, and skin: Secondary | ICD-10-CM | POA: Diagnosis not present

## 2020-02-08 DIAGNOSIS — H16223 Keratoconjunctivitis sicca, not specified as Sjogren's, bilateral: Secondary | ICD-10-CM | POA: Diagnosis not present

## 2020-02-08 DIAGNOSIS — I4819 Other persistent atrial fibrillation: Secondary | ICD-10-CM | POA: Diagnosis not present

## 2020-02-08 DIAGNOSIS — D649 Anemia, unspecified: Secondary | ICD-10-CM | POA: Diagnosis not present

## 2020-02-08 DIAGNOSIS — K8010 Calculus of gallbladder with chronic cholecystitis without obstruction: Secondary | ICD-10-CM | POA: Diagnosis not present

## 2020-02-08 DIAGNOSIS — M25561 Pain in right knee: Secondary | ICD-10-CM | POA: Diagnosis not present

## 2020-02-08 DIAGNOSIS — F329 Major depressive disorder, single episode, unspecified: Secondary | ICD-10-CM | POA: Diagnosis not present

## 2020-02-08 DIAGNOSIS — I9589 Other hypotension: Secondary | ICD-10-CM | POA: Diagnosis not present

## 2020-02-08 DIAGNOSIS — C8213 Follicular lymphoma grade II, intra-abdominal lymph nodes: Secondary | ICD-10-CM | POA: Diagnosis not present

## 2020-02-08 DIAGNOSIS — I5032 Chronic diastolic (congestive) heart failure: Secondary | ICD-10-CM | POA: Diagnosis not present

## 2020-02-08 DIAGNOSIS — M159 Polyosteoarthritis, unspecified: Secondary | ICD-10-CM | POA: Diagnosis not present

## 2020-02-08 DIAGNOSIS — M9903 Segmental and somatic dysfunction of lumbar region: Secondary | ICD-10-CM | POA: Diagnosis not present

## 2020-02-08 DIAGNOSIS — M0579 Rheumatoid arthritis with rheumatoid factor of multiple sites without organ or systems involvement: Secondary | ICD-10-CM | POA: Diagnosis not present

## 2020-02-08 DIAGNOSIS — Z89511 Acquired absence of right leg below knee: Secondary | ICD-10-CM | POA: Diagnosis not present

## 2020-02-08 DIAGNOSIS — R278 Other lack of coordination: Secondary | ICD-10-CM | POA: Diagnosis not present

## 2020-02-08 DIAGNOSIS — I4891 Unspecified atrial fibrillation: Secondary | ICD-10-CM | POA: Diagnosis not present

## 2020-02-08 DIAGNOSIS — K2970 Gastritis, unspecified, without bleeding: Secondary | ICD-10-CM | POA: Diagnosis not present

## 2020-02-08 DIAGNOSIS — R55 Syncope and collapse: Secondary | ICD-10-CM | POA: Diagnosis not present

## 2020-02-08 DIAGNOSIS — M170 Bilateral primary osteoarthritis of knee: Secondary | ICD-10-CM | POA: Diagnosis not present

## 2020-02-08 DIAGNOSIS — M79604 Pain in right leg: Secondary | ICD-10-CM | POA: Diagnosis not present

## 2020-02-08 DIAGNOSIS — R0989 Other specified symptoms and signs involving the circulatory and respiratory systems: Secondary | ICD-10-CM | POA: Diagnosis not present

## 2020-02-08 DIAGNOSIS — M1612 Unilateral primary osteoarthritis, left hip: Secondary | ICD-10-CM | POA: Diagnosis not present

## 2020-02-08 DIAGNOSIS — M359 Systemic involvement of connective tissue, unspecified: Secondary | ICD-10-CM | POA: Diagnosis not present

## 2020-02-08 DIAGNOSIS — F331 Major depressive disorder, recurrent, moderate: Secondary | ICD-10-CM | POA: Diagnosis not present

## 2020-02-08 DIAGNOSIS — Z8673 Personal history of transient ischemic attack (TIA), and cerebral infarction without residual deficits: Secondary | ICD-10-CM | POA: Diagnosis not present

## 2020-02-08 DIAGNOSIS — N159 Renal tubulo-interstitial disease, unspecified: Secondary | ICD-10-CM | POA: Diagnosis not present

## 2020-02-09 DIAGNOSIS — A4181 Sepsis due to Enterococcus: Secondary | ICD-10-CM | POA: Diagnosis not present

## 2020-02-09 DIAGNOSIS — J432 Centrilobular emphysema: Secondary | ICD-10-CM | POA: Diagnosis not present

## 2020-02-09 DIAGNOSIS — J9611 Chronic respiratory failure with hypoxia: Secondary | ICD-10-CM | POA: Diagnosis not present

## 2020-02-09 DIAGNOSIS — R8271 Bacteriuria: Secondary | ICD-10-CM | POA: Diagnosis not present

## 2020-02-09 DIAGNOSIS — M4316 Spondylolisthesis, lumbar region: Secondary | ICD-10-CM | POA: Diagnosis not present

## 2020-02-09 DIAGNOSIS — Z85038 Personal history of other malignant neoplasm of large intestine: Secondary | ICD-10-CM | POA: Diagnosis not present

## 2020-02-09 DIAGNOSIS — K5730 Diverticulosis of large intestine without perforation or abscess without bleeding: Secondary | ICD-10-CM | POA: Diagnosis not present

## 2020-02-09 DIAGNOSIS — D0511 Intraductal carcinoma in situ of right breast: Secondary | ICD-10-CM | POA: Diagnosis not present

## 2020-02-09 DIAGNOSIS — E1159 Type 2 diabetes mellitus with other circulatory complications: Secondary | ICD-10-CM | POA: Diagnosis not present

## 2020-02-09 DIAGNOSIS — N1832 Chronic kidney disease, stage 3b: Secondary | ICD-10-CM | POA: Diagnosis not present

## 2020-02-09 DIAGNOSIS — R269 Unspecified abnormalities of gait and mobility: Secondary | ICD-10-CM | POA: Diagnosis not present

## 2020-02-09 DIAGNOSIS — R590 Localized enlarged lymph nodes: Secondary | ICD-10-CM | POA: Diagnosis not present

## 2020-02-09 DIAGNOSIS — Z7409 Other reduced mobility: Secondary | ICD-10-CM | POA: Diagnosis not present

## 2020-02-09 DIAGNOSIS — R413 Other amnesia: Secondary | ICD-10-CM | POA: Diagnosis not present

## 2020-02-09 DIAGNOSIS — N2581 Secondary hyperparathyroidism of renal origin: Secondary | ICD-10-CM | POA: Diagnosis not present

## 2020-02-09 DIAGNOSIS — I872 Venous insufficiency (chronic) (peripheral): Secondary | ICD-10-CM | POA: Diagnosis not present

## 2020-02-09 DIAGNOSIS — C184 Malignant neoplasm of transverse colon: Secondary | ICD-10-CM | POA: Diagnosis not present

## 2020-02-09 DIAGNOSIS — K8020 Calculus of gallbladder without cholecystitis without obstruction: Secondary | ICD-10-CM | POA: Diagnosis not present

## 2020-02-09 DIAGNOSIS — M79652 Pain in left thigh: Secondary | ICD-10-CM | POA: Diagnosis not present

## 2020-02-09 DIAGNOSIS — S32029D Unspecified fracture of second lumbar vertebra, subsequent encounter for fracture with routine healing: Secondary | ICD-10-CM | POA: Diagnosis not present

## 2020-02-09 DIAGNOSIS — L0889 Other specified local infections of the skin and subcutaneous tissue: Secondary | ICD-10-CM | POA: Diagnosis not present

## 2020-02-09 DIAGNOSIS — M2578 Osteophyte, vertebrae: Secondary | ICD-10-CM | POA: Diagnosis not present

## 2020-02-09 DIAGNOSIS — I132 Hypertensive heart and chronic kidney disease with heart failure and with stage 5 chronic kidney disease, or end stage renal disease: Secondary | ICD-10-CM | POA: Diagnosis not present

## 2020-02-09 DIAGNOSIS — Z1211 Encounter for screening for malignant neoplasm of colon: Secondary | ICD-10-CM | POA: Diagnosis not present

## 2020-02-09 DIAGNOSIS — M1712 Unilateral primary osteoarthritis, left knee: Secondary | ICD-10-CM | POA: Diagnosis not present

## 2020-02-09 DIAGNOSIS — C770 Secondary and unspecified malignant neoplasm of lymph nodes of head, face and neck: Secondary | ICD-10-CM | POA: Diagnosis not present

## 2020-02-09 DIAGNOSIS — I50810 Right heart failure, unspecified: Secondary | ICD-10-CM | POA: Diagnosis not present

## 2020-02-09 DIAGNOSIS — K5520 Angiodysplasia of colon without hemorrhage: Secondary | ICD-10-CM | POA: Diagnosis not present

## 2020-02-09 DIAGNOSIS — J439 Emphysema, unspecified: Secondary | ICD-10-CM | POA: Diagnosis not present

## 2020-02-09 DIAGNOSIS — M25551 Pain in right hip: Secondary | ICD-10-CM | POA: Diagnosis not present

## 2020-02-09 DIAGNOSIS — R079 Chest pain, unspecified: Secondary | ICD-10-CM | POA: Diagnosis not present

## 2020-02-09 DIAGNOSIS — N390 Urinary tract infection, site not specified: Secondary | ICD-10-CM | POA: Diagnosis not present

## 2020-02-09 DIAGNOSIS — K219 Gastro-esophageal reflux disease without esophagitis: Secondary | ICD-10-CM | POA: Diagnosis not present

## 2020-02-09 DIAGNOSIS — F17210 Nicotine dependence, cigarettes, uncomplicated: Secondary | ICD-10-CM | POA: Diagnosis not present

## 2020-02-09 DIAGNOSIS — N182 Chronic kidney disease, stage 2 (mild): Secondary | ICD-10-CM | POA: Diagnosis not present

## 2020-02-09 DIAGNOSIS — I2699 Other pulmonary embolism without acute cor pulmonale: Secondary | ICD-10-CM | POA: Diagnosis not present

## 2020-02-09 DIAGNOSIS — S46011A Strain of muscle(s) and tendon(s) of the rotator cuff of right shoulder, initial encounter: Secondary | ICD-10-CM | POA: Diagnosis not present

## 2020-02-09 DIAGNOSIS — R29898 Other symptoms and signs involving the musculoskeletal system: Secondary | ICD-10-CM | POA: Diagnosis not present

## 2020-02-09 DIAGNOSIS — I252 Old myocardial infarction: Secondary | ICD-10-CM | POA: Diagnosis not present

## 2020-02-09 DIAGNOSIS — R634 Abnormal weight loss: Secondary | ICD-10-CM | POA: Diagnosis not present

## 2020-02-09 DIAGNOSIS — Z794 Long term (current) use of insulin: Secondary | ICD-10-CM | POA: Diagnosis not present

## 2020-02-09 DIAGNOSIS — Z923 Personal history of irradiation: Secondary | ICD-10-CM | POA: Diagnosis not present

## 2020-02-09 DIAGNOSIS — R932 Abnormal findings on diagnostic imaging of liver and biliary tract: Secondary | ICD-10-CM | POA: Diagnosis not present

## 2020-02-09 DIAGNOSIS — B351 Tinea unguium: Secondary | ICD-10-CM | POA: Diagnosis not present

## 2020-02-09 DIAGNOSIS — M9904 Segmental and somatic dysfunction of sacral region: Secondary | ICD-10-CM | POA: Diagnosis not present

## 2020-02-09 DIAGNOSIS — R5383 Other fatigue: Secondary | ICD-10-CM | POA: Diagnosis not present

## 2020-02-09 DIAGNOSIS — I5032 Chronic diastolic (congestive) heart failure: Secondary | ICD-10-CM | POA: Diagnosis not present

## 2020-02-09 DIAGNOSIS — J441 Chronic obstructive pulmonary disease with (acute) exacerbation: Secondary | ICD-10-CM | POA: Diagnosis not present

## 2020-02-09 DIAGNOSIS — R0789 Other chest pain: Secondary | ICD-10-CM | POA: Diagnosis not present

## 2020-02-09 DIAGNOSIS — E782 Mixed hyperlipidemia: Secondary | ICD-10-CM | POA: Diagnosis not present

## 2020-02-09 DIAGNOSIS — E7800 Pure hypercholesterolemia, unspecified: Secondary | ICD-10-CM | POA: Diagnosis not present

## 2020-02-09 DIAGNOSIS — C774 Secondary and unspecified malignant neoplasm of inguinal and lower limb lymph nodes: Secondary | ICD-10-CM | POA: Diagnosis not present

## 2020-02-09 DIAGNOSIS — Z9221 Personal history of antineoplastic chemotherapy: Secondary | ICD-10-CM | POA: Diagnosis not present

## 2020-02-09 DIAGNOSIS — D72829 Elevated white blood cell count, unspecified: Secondary | ICD-10-CM | POA: Diagnosis not present

## 2020-02-09 DIAGNOSIS — Z96652 Presence of left artificial knee joint: Secondary | ICD-10-CM | POA: Diagnosis not present

## 2020-02-09 DIAGNOSIS — R002 Palpitations: Secondary | ICD-10-CM | POA: Diagnosis not present

## 2020-02-09 DIAGNOSIS — N289 Disorder of kidney and ureter, unspecified: Secondary | ICD-10-CM | POA: Diagnosis not present

## 2020-02-09 DIAGNOSIS — I471 Supraventricular tachycardia: Secondary | ICD-10-CM | POA: Diagnosis not present

## 2020-02-09 DIAGNOSIS — Z23 Encounter for immunization: Secondary | ICD-10-CM | POA: Diagnosis not present

## 2020-02-09 DIAGNOSIS — D471 Chronic myeloproliferative disease: Secondary | ICD-10-CM | POA: Diagnosis not present

## 2020-02-09 DIAGNOSIS — D508 Other iron deficiency anemias: Secondary | ICD-10-CM | POA: Diagnosis not present

## 2020-02-09 DIAGNOSIS — E1140 Type 2 diabetes mellitus with diabetic neuropathy, unspecified: Secondary | ICD-10-CM | POA: Diagnosis not present

## 2020-02-09 DIAGNOSIS — R627 Adult failure to thrive: Secondary | ICD-10-CM | POA: Diagnosis not present

## 2020-02-09 DIAGNOSIS — H54415A Blindness right eye category 5, normal vision left eye: Secondary | ICD-10-CM | POA: Diagnosis not present

## 2020-02-09 DIAGNOSIS — R1011 Right upper quadrant pain: Secondary | ICD-10-CM | POA: Diagnosis not present

## 2020-02-09 DIAGNOSIS — G4700 Insomnia, unspecified: Secondary | ICD-10-CM | POA: Diagnosis not present

## 2020-02-09 DIAGNOSIS — M79676 Pain in unspecified toe(s): Secondary | ICD-10-CM | POA: Diagnosis not present

## 2020-02-09 DIAGNOSIS — C44519 Basal cell carcinoma of skin of other part of trunk: Secondary | ICD-10-CM | POA: Diagnosis not present

## 2020-02-09 DIAGNOSIS — L309 Dermatitis, unspecified: Secondary | ICD-10-CM | POA: Diagnosis not present

## 2020-02-09 DIAGNOSIS — J9601 Acute respiratory failure with hypoxia: Secondary | ICD-10-CM | POA: Diagnosis not present

## 2020-02-09 DIAGNOSIS — M47817 Spondylosis without myelopathy or radiculopathy, lumbosacral region: Secondary | ICD-10-CM | POA: Diagnosis not present

## 2020-02-09 DIAGNOSIS — J301 Allergic rhinitis due to pollen: Secondary | ICD-10-CM | POA: Diagnosis not present

## 2020-02-09 DIAGNOSIS — J45909 Unspecified asthma, uncomplicated: Secondary | ICD-10-CM | POA: Diagnosis not present

## 2020-02-09 DIAGNOSIS — N402 Nodular prostate without lower urinary tract symptoms: Secondary | ICD-10-CM | POA: Diagnosis not present

## 2020-02-09 DIAGNOSIS — N184 Chronic kidney disease, stage 4 (severe): Secondary | ICD-10-CM | POA: Diagnosis not present

## 2020-02-09 DIAGNOSIS — C4371 Malignant melanoma of right lower limb, including hip: Secondary | ICD-10-CM | POA: Diagnosis not present

## 2020-02-09 DIAGNOSIS — K589 Irritable bowel syndrome without diarrhea: Secondary | ICD-10-CM | POA: Diagnosis not present

## 2020-02-09 DIAGNOSIS — G8929 Other chronic pain: Secondary | ICD-10-CM | POA: Diagnosis not present

## 2020-02-09 DIAGNOSIS — R922 Inconclusive mammogram: Secondary | ICD-10-CM | POA: Diagnosis not present

## 2020-02-09 DIAGNOSIS — Z20822 Contact with and (suspected) exposure to covid-19: Secondary | ICD-10-CM | POA: Diagnosis not present

## 2020-02-09 DIAGNOSIS — M75111 Incomplete rotator cuff tear or rupture of right shoulder, not specified as traumatic: Secondary | ICD-10-CM | POA: Diagnosis not present

## 2020-02-09 DIAGNOSIS — Z96653 Presence of artificial knee joint, bilateral: Secondary | ICD-10-CM | POA: Diagnosis not present

## 2020-02-09 DIAGNOSIS — I69928 Other speech and language deficits following unspecified cerebrovascular disease: Secondary | ICD-10-CM | POA: Diagnosis not present

## 2020-02-09 DIAGNOSIS — S0101XA Laceration without foreign body of scalp, initial encounter: Secondary | ICD-10-CM | POA: Diagnosis not present

## 2020-02-09 DIAGNOSIS — Z720 Tobacco use: Secondary | ICD-10-CM | POA: Diagnosis not present

## 2020-02-09 DIAGNOSIS — F0390 Unspecified dementia without behavioral disturbance: Secondary | ICD-10-CM | POA: Diagnosis not present

## 2020-02-09 DIAGNOSIS — M25532 Pain in left wrist: Secondary | ICD-10-CM | POA: Diagnosis not present

## 2020-02-09 DIAGNOSIS — G8250 Quadriplegia, unspecified: Secondary | ICD-10-CM | POA: Diagnosis not present

## 2020-02-09 DIAGNOSIS — M25612 Stiffness of left shoulder, not elsewhere classified: Secondary | ICD-10-CM | POA: Diagnosis not present

## 2020-02-09 DIAGNOSIS — J1282 Pneumonia due to coronavirus disease 2019: Secondary | ICD-10-CM | POA: Diagnosis not present

## 2020-02-09 DIAGNOSIS — R410 Disorientation, unspecified: Secondary | ICD-10-CM | POA: Diagnosis not present

## 2020-02-09 DIAGNOSIS — Z8673 Personal history of transient ischemic attack (TIA), and cerebral infarction without residual deficits: Secondary | ICD-10-CM | POA: Diagnosis not present

## 2020-02-09 DIAGNOSIS — E041 Nontoxic single thyroid nodule: Secondary | ICD-10-CM | POA: Diagnosis not present

## 2020-02-09 DIAGNOSIS — R350 Frequency of micturition: Secondary | ICD-10-CM | POA: Diagnosis not present

## 2020-02-09 DIAGNOSIS — J45901 Unspecified asthma with (acute) exacerbation: Secondary | ICD-10-CM | POA: Diagnosis not present

## 2020-02-09 DIAGNOSIS — M25661 Stiffness of right knee, not elsewhere classified: Secondary | ICD-10-CM | POA: Diagnosis not present

## 2020-02-09 DIAGNOSIS — E088 Diabetes mellitus due to underlying condition with unspecified complications: Secondary | ICD-10-CM | POA: Diagnosis not present

## 2020-02-09 DIAGNOSIS — M47816 Spondylosis without myelopathy or radiculopathy, lumbar region: Secondary | ICD-10-CM | POA: Diagnosis not present

## 2020-02-09 DIAGNOSIS — H259 Unspecified age-related cataract: Secondary | ICD-10-CM | POA: Diagnosis not present

## 2020-02-09 DIAGNOSIS — Z1331 Encounter for screening for depression: Secondary | ICD-10-CM | POA: Diagnosis not present

## 2020-02-09 DIAGNOSIS — N179 Acute kidney failure, unspecified: Secondary | ICD-10-CM | POA: Diagnosis not present

## 2020-02-09 DIAGNOSIS — N200 Calculus of kidney: Secondary | ICD-10-CM | POA: Diagnosis not present

## 2020-02-09 DIAGNOSIS — M25511 Pain in right shoulder: Secondary | ICD-10-CM | POA: Diagnosis not present

## 2020-02-09 DIAGNOSIS — C7989 Secondary malignant neoplasm of other specified sites: Secondary | ICD-10-CM | POA: Diagnosis not present

## 2020-02-09 DIAGNOSIS — E1165 Type 2 diabetes mellitus with hyperglycemia: Secondary | ICD-10-CM | POA: Diagnosis not present

## 2020-02-09 DIAGNOSIS — R252 Cramp and spasm: Secondary | ICD-10-CM | POA: Diagnosis not present

## 2020-02-09 DIAGNOSIS — H401131 Primary open-angle glaucoma, bilateral, mild stage: Secondary | ICD-10-CM | POA: Diagnosis not present

## 2020-02-09 DIAGNOSIS — M1611 Unilateral primary osteoarthritis, right hip: Secondary | ICD-10-CM | POA: Diagnosis not present

## 2020-02-09 DIAGNOSIS — C50412 Malignant neoplasm of upper-outer quadrant of left female breast: Secondary | ICD-10-CM | POA: Diagnosis not present

## 2020-02-09 DIAGNOSIS — Z510 Encounter for antineoplastic radiation therapy: Secondary | ICD-10-CM | POA: Diagnosis not present

## 2020-02-09 DIAGNOSIS — I080 Rheumatic disorders of both mitral and aortic valves: Secondary | ICD-10-CM | POA: Diagnosis not present

## 2020-02-09 DIAGNOSIS — Z7982 Long term (current) use of aspirin: Secondary | ICD-10-CM | POA: Diagnosis not present

## 2020-02-09 DIAGNOSIS — I10 Essential (primary) hypertension: Secondary | ICD-10-CM | POA: Diagnosis not present

## 2020-02-09 DIAGNOSIS — M25512 Pain in left shoulder: Secondary | ICD-10-CM | POA: Diagnosis not present

## 2020-02-09 DIAGNOSIS — R112 Nausea with vomiting, unspecified: Secondary | ICD-10-CM | POA: Diagnosis not present

## 2020-02-09 DIAGNOSIS — C50912 Malignant neoplasm of unspecified site of left female breast: Secondary | ICD-10-CM | POA: Diagnosis not present

## 2020-02-09 DIAGNOSIS — Z7951 Long term (current) use of inhaled steroids: Secondary | ICD-10-CM | POA: Diagnosis not present

## 2020-02-09 DIAGNOSIS — F411 Generalized anxiety disorder: Secondary | ICD-10-CM | POA: Diagnosis not present

## 2020-02-09 DIAGNOSIS — Z1231 Encounter for screening mammogram for malignant neoplasm of breast: Secondary | ICD-10-CM | POA: Diagnosis not present

## 2020-02-09 DIAGNOSIS — K2950 Unspecified chronic gastritis without bleeding: Secondary | ICD-10-CM | POA: Diagnosis not present

## 2020-02-09 DIAGNOSIS — R7303 Prediabetes: Secondary | ICD-10-CM | POA: Diagnosis not present

## 2020-02-09 DIAGNOSIS — F329 Major depressive disorder, single episode, unspecified: Secondary | ICD-10-CM | POA: Diagnosis not present

## 2020-02-09 DIAGNOSIS — C3431 Malignant neoplasm of lower lobe, right bronchus or lung: Secondary | ICD-10-CM | POA: Diagnosis not present

## 2020-02-09 DIAGNOSIS — N189 Chronic kidney disease, unspecified: Secondary | ICD-10-CM | POA: Diagnosis not present

## 2020-02-09 DIAGNOSIS — L89151 Pressure ulcer of sacral region, stage 1: Secondary | ICD-10-CM | POA: Diagnosis not present

## 2020-02-09 DIAGNOSIS — K810 Acute cholecystitis: Secondary | ICD-10-CM | POA: Diagnosis not present

## 2020-02-09 DIAGNOSIS — Z952 Presence of prosthetic heart valve: Secondary | ICD-10-CM | POA: Diagnosis not present

## 2020-02-09 DIAGNOSIS — J449 Chronic obstructive pulmonary disease, unspecified: Secondary | ICD-10-CM | POA: Diagnosis not present

## 2020-02-09 DIAGNOSIS — R3129 Other microscopic hematuria: Secondary | ICD-10-CM | POA: Diagnosis not present

## 2020-02-09 DIAGNOSIS — N3946 Mixed incontinence: Secondary | ICD-10-CM | POA: Diagnosis not present

## 2020-02-09 DIAGNOSIS — Z931 Gastrostomy status: Secondary | ICD-10-CM | POA: Diagnosis not present

## 2020-02-09 DIAGNOSIS — Z743 Need for continuous supervision: Secondary | ICD-10-CM | POA: Diagnosis not present

## 2020-02-09 DIAGNOSIS — Z48812 Encounter for surgical aftercare following surgery on the circulatory system: Secondary | ICD-10-CM | POA: Diagnosis not present

## 2020-02-09 DIAGNOSIS — M1990 Unspecified osteoarthritis, unspecified site: Secondary | ICD-10-CM | POA: Diagnosis not present

## 2020-02-09 DIAGNOSIS — G9341 Metabolic encephalopathy: Secondary | ICD-10-CM | POA: Diagnosis not present

## 2020-02-09 DIAGNOSIS — G629 Polyneuropathy, unspecified: Secondary | ICD-10-CM | POA: Diagnosis not present

## 2020-02-09 DIAGNOSIS — Z13220 Encounter for screening for lipoid disorders: Secondary | ICD-10-CM | POA: Diagnosis not present

## 2020-02-09 DIAGNOSIS — N183 Chronic kidney disease, stage 3 unspecified: Secondary | ICD-10-CM | POA: Diagnosis not present

## 2020-02-09 DIAGNOSIS — S72031D Displaced midcervical fracture of right femur, subsequent encounter for closed fracture with routine healing: Secondary | ICD-10-CM | POA: Diagnosis not present

## 2020-02-09 DIAGNOSIS — Z79899 Other long term (current) drug therapy: Secondary | ICD-10-CM | POA: Diagnosis not present

## 2020-02-09 DIAGNOSIS — D638 Anemia in other chronic diseases classified elsewhere: Secondary | ICD-10-CM | POA: Diagnosis not present

## 2020-02-09 DIAGNOSIS — I700 Atherosclerosis of aorta: Secondary | ICD-10-CM | POA: Diagnosis not present

## 2020-02-09 DIAGNOSIS — M0609 Rheumatoid arthritis without rheumatoid factor, multiple sites: Secondary | ICD-10-CM | POA: Diagnosis not present

## 2020-02-09 DIAGNOSIS — G3184 Mild cognitive impairment, so stated: Secondary | ICD-10-CM | POA: Diagnosis not present

## 2020-02-09 DIAGNOSIS — D500 Iron deficiency anemia secondary to blood loss (chronic): Secondary | ICD-10-CM | POA: Diagnosis not present

## 2020-02-09 DIAGNOSIS — G894 Chronic pain syndrome: Secondary | ICD-10-CM | POA: Diagnosis not present

## 2020-02-09 DIAGNOSIS — E43 Unspecified severe protein-calorie malnutrition: Secondary | ICD-10-CM | POA: Diagnosis not present

## 2020-02-09 DIAGNOSIS — M25761 Osteophyte, right knee: Secondary | ICD-10-CM | POA: Diagnosis not present

## 2020-02-09 DIAGNOSIS — L219 Seborrheic dermatitis, unspecified: Secondary | ICD-10-CM | POA: Diagnosis not present

## 2020-02-09 DIAGNOSIS — J849 Interstitial pulmonary disease, unspecified: Secondary | ICD-10-CM | POA: Diagnosis not present

## 2020-02-09 DIAGNOSIS — K5732 Diverticulitis of large intestine without perforation or abscess without bleeding: Secondary | ICD-10-CM | POA: Diagnosis not present

## 2020-02-09 DIAGNOSIS — D485 Neoplasm of uncertain behavior of skin: Secondary | ICD-10-CM | POA: Diagnosis not present

## 2020-02-09 DIAGNOSIS — Z85828 Personal history of other malignant neoplasm of skin: Secondary | ICD-10-CM | POA: Diagnosis not present

## 2020-02-09 DIAGNOSIS — E538 Deficiency of other specified B group vitamins: Secondary | ICD-10-CM | POA: Diagnosis not present

## 2020-02-09 DIAGNOSIS — Z7401 Bed confinement status: Secondary | ICD-10-CM | POA: Diagnosis not present

## 2020-02-09 DIAGNOSIS — L720 Epidermal cyst: Secondary | ICD-10-CM | POA: Diagnosis not present

## 2020-02-09 DIAGNOSIS — M7989 Other specified soft tissue disorders: Secondary | ICD-10-CM | POA: Diagnosis not present

## 2020-02-09 DIAGNOSIS — C44311 Basal cell carcinoma of skin of nose: Secondary | ICD-10-CM | POA: Diagnosis not present

## 2020-02-09 DIAGNOSIS — H3530 Unspecified macular degeneration: Secondary | ICD-10-CM | POA: Diagnosis not present

## 2020-02-09 DIAGNOSIS — J440 Chronic obstructive pulmonary disease with acute lower respiratory infection: Secondary | ICD-10-CM | POA: Diagnosis not present

## 2020-02-09 DIAGNOSIS — K3189 Other diseases of stomach and duodenum: Secondary | ICD-10-CM | POA: Diagnosis not present

## 2020-02-09 DIAGNOSIS — M79671 Pain in right foot: Secondary | ICD-10-CM | POA: Diagnosis not present

## 2020-02-09 DIAGNOSIS — G43009 Migraine without aura, not intractable, without status migrainosus: Secondary | ICD-10-CM | POA: Diagnosis not present

## 2020-02-09 DIAGNOSIS — Z471 Aftercare following joint replacement surgery: Secondary | ICD-10-CM | POA: Diagnosis not present

## 2020-02-09 DIAGNOSIS — R3914 Feeling of incomplete bladder emptying: Secondary | ICD-10-CM | POA: Diagnosis not present

## 2020-02-09 DIAGNOSIS — K760 Fatty (change of) liver, not elsewhere classified: Secondary | ICD-10-CM | POA: Diagnosis not present

## 2020-02-09 DIAGNOSIS — J4540 Moderate persistent asthma, uncomplicated: Secondary | ICD-10-CM | POA: Diagnosis not present

## 2020-02-09 DIAGNOSIS — I2721 Secondary pulmonary arterial hypertension: Secondary | ICD-10-CM | POA: Diagnosis not present

## 2020-02-09 DIAGNOSIS — D469 Myelodysplastic syndrome, unspecified: Secondary | ICD-10-CM | POA: Diagnosis not present

## 2020-02-09 DIAGNOSIS — I5042 Chronic combined systolic (congestive) and diastolic (congestive) heart failure: Secondary | ICD-10-CM | POA: Diagnosis not present

## 2020-02-09 DIAGNOSIS — J431 Panlobular emphysema: Secondary | ICD-10-CM | POA: Diagnosis not present

## 2020-02-09 DIAGNOSIS — I6509 Occlusion and stenosis of unspecified vertebral artery: Secondary | ICD-10-CM | POA: Diagnosis not present

## 2020-02-09 DIAGNOSIS — M150 Primary generalized (osteo)arthritis: Secondary | ICD-10-CM | POA: Diagnosis not present

## 2020-02-09 DIAGNOSIS — G20 Parkinson's disease: Secondary | ICD-10-CM | POA: Diagnosis not present

## 2020-02-09 DIAGNOSIS — F900 Attention-deficit hyperactivity disorder, predominantly inattentive type: Secondary | ICD-10-CM | POA: Diagnosis not present

## 2020-02-09 DIAGNOSIS — R000 Tachycardia, unspecified: Secondary | ICD-10-CM | POA: Diagnosis not present

## 2020-02-09 DIAGNOSIS — Z79811 Long term (current) use of aromatase inhibitors: Secondary | ICD-10-CM | POA: Diagnosis not present

## 2020-02-09 DIAGNOSIS — C61 Malignant neoplasm of prostate: Secondary | ICD-10-CM | POA: Diagnosis not present

## 2020-02-09 DIAGNOSIS — Z6836 Body mass index (BMI) 36.0-36.9, adult: Secondary | ICD-10-CM | POA: Diagnosis not present

## 2020-02-09 DIAGNOSIS — C50211 Malignant neoplasm of upper-inner quadrant of right female breast: Secondary | ICD-10-CM | POA: Diagnosis not present

## 2020-02-09 DIAGNOSIS — R4182 Altered mental status, unspecified: Secondary | ICD-10-CM | POA: Diagnosis not present

## 2020-02-09 DIAGNOSIS — F330 Major depressive disorder, recurrent, mild: Secondary | ICD-10-CM | POA: Diagnosis not present

## 2020-02-09 DIAGNOSIS — G4734 Idiopathic sleep related nonobstructive alveolar hypoventilation: Secondary | ICD-10-CM | POA: Diagnosis not present

## 2020-02-09 DIAGNOSIS — Z0000 Encounter for general adult medical examination without abnormal findings: Secondary | ICD-10-CM | POA: Diagnosis not present

## 2020-02-09 DIAGNOSIS — M6281 Muscle weakness (generalized): Secondary | ICD-10-CM | POA: Diagnosis not present

## 2020-02-09 DIAGNOSIS — M79621 Pain in right upper arm: Secondary | ICD-10-CM | POA: Diagnosis not present

## 2020-02-09 DIAGNOSIS — M25531 Pain in right wrist: Secondary | ICD-10-CM | POA: Diagnosis not present

## 2020-02-09 DIAGNOSIS — G4710 Hypersomnia, unspecified: Secondary | ICD-10-CM | POA: Diagnosis not present

## 2020-02-09 DIAGNOSIS — E559 Vitamin D deficiency, unspecified: Secondary | ICD-10-CM | POA: Diagnosis not present

## 2020-02-09 DIAGNOSIS — M25562 Pain in left knee: Secondary | ICD-10-CM | POA: Diagnosis not present

## 2020-02-09 DIAGNOSIS — S72011D Unspecified intracapsular fracture of right femur, subsequent encounter for closed fracture with routine healing: Secondary | ICD-10-CM | POA: Diagnosis not present

## 2020-02-09 DIAGNOSIS — R531 Weakness: Secondary | ICD-10-CM | POA: Diagnosis not present

## 2020-02-09 DIAGNOSIS — H40003 Preglaucoma, unspecified, bilateral: Secondary | ICD-10-CM | POA: Diagnosis not present

## 2020-02-09 DIAGNOSIS — Z03818 Encounter for observation for suspected exposure to other biological agents ruled out: Secondary | ICD-10-CM | POA: Diagnosis not present

## 2020-02-09 DIAGNOSIS — R0600 Dyspnea, unspecified: Secondary | ICD-10-CM | POA: Diagnosis not present

## 2020-02-09 DIAGNOSIS — Z992 Dependence on renal dialysis: Secondary | ICD-10-CM | POA: Diagnosis not present

## 2020-02-09 DIAGNOSIS — D2330 Other benign neoplasm of skin of unspecified part of face: Secondary | ICD-10-CM | POA: Diagnosis not present

## 2020-02-09 DIAGNOSIS — Z87891 Personal history of nicotine dependence: Secondary | ICD-10-CM | POA: Diagnosis not present

## 2020-02-09 DIAGNOSIS — C7951 Secondary malignant neoplasm of bone: Secondary | ICD-10-CM | POA: Diagnosis not present

## 2020-02-09 DIAGNOSIS — C221 Intrahepatic bile duct carcinoma: Secondary | ICD-10-CM | POA: Diagnosis not present

## 2020-02-09 DIAGNOSIS — M9901 Segmental and somatic dysfunction of cervical region: Secondary | ICD-10-CM | POA: Diagnosis not present

## 2020-02-09 DIAGNOSIS — S82112D Displaced fracture of left tibial spine, subsequent encounter for closed fracture with routine healing: Secondary | ICD-10-CM | POA: Diagnosis not present

## 2020-02-09 DIAGNOSIS — Z96641 Presence of right artificial hip joint: Secondary | ICD-10-CM | POA: Diagnosis not present

## 2020-02-09 DIAGNOSIS — Z1239 Encounter for other screening for malignant neoplasm of breast: Secondary | ICD-10-CM | POA: Diagnosis not present

## 2020-02-09 DIAGNOSIS — Z9049 Acquired absence of other specified parts of digestive tract: Secondary | ICD-10-CM | POA: Diagnosis not present

## 2020-02-09 DIAGNOSIS — W19XXXD Unspecified fall, subsequent encounter: Secondary | ICD-10-CM | POA: Diagnosis not present

## 2020-02-09 DIAGNOSIS — R768 Other specified abnormal immunological findings in serum: Secondary | ICD-10-CM | POA: Diagnosis not present

## 2020-02-09 DIAGNOSIS — Z5181 Encounter for therapeutic drug level monitoring: Secondary | ICD-10-CM | POA: Diagnosis not present

## 2020-02-09 DIAGNOSIS — C44722 Squamous cell carcinoma of skin of right lower limb, including hip: Secondary | ICD-10-CM | POA: Diagnosis not present

## 2020-02-09 DIAGNOSIS — K8010 Calculus of gallbladder with chronic cholecystitis without obstruction: Secondary | ICD-10-CM | POA: Diagnosis not present

## 2020-02-09 DIAGNOSIS — Z880 Allergy status to penicillin: Secondary | ICD-10-CM | POA: Diagnosis not present

## 2020-02-09 DIAGNOSIS — I082 Rheumatic disorders of both aortic and tricuspid valves: Secondary | ICD-10-CM | POA: Diagnosis not present

## 2020-02-09 DIAGNOSIS — M2550 Pain in unspecified joint: Secondary | ICD-10-CM | POA: Diagnosis not present

## 2020-02-09 DIAGNOSIS — Q87410 Marfan's syndrome with aortic dilation: Secondary | ICD-10-CM | POA: Diagnosis not present

## 2020-02-09 DIAGNOSIS — M25611 Stiffness of right shoulder, not elsewhere classified: Secondary | ICD-10-CM | POA: Diagnosis not present

## 2020-02-09 DIAGNOSIS — M79672 Pain in left foot: Secondary | ICD-10-CM | POA: Diagnosis not present

## 2020-02-09 DIAGNOSIS — W19XXXA Unspecified fall, initial encounter: Secondary | ICD-10-CM | POA: Diagnosis not present

## 2020-02-09 DIAGNOSIS — I6782 Cerebral ischemia: Secondary | ICD-10-CM | POA: Diagnosis not present

## 2020-02-09 DIAGNOSIS — D0461 Carcinoma in situ of skin of right upper limb, including shoulder: Secondary | ICD-10-CM | POA: Diagnosis not present

## 2020-02-09 DIAGNOSIS — F332 Major depressive disorder, recurrent severe without psychotic features: Secondary | ICD-10-CM | POA: Diagnosis not present

## 2020-02-09 DIAGNOSIS — N401 Enlarged prostate with lower urinary tract symptoms: Secondary | ICD-10-CM | POA: Diagnosis not present

## 2020-02-09 DIAGNOSIS — Z9981 Dependence on supplemental oxygen: Secondary | ICD-10-CM | POA: Diagnosis not present

## 2020-02-09 DIAGNOSIS — M170 Bilateral primary osteoarthritis of knee: Secondary | ICD-10-CM | POA: Diagnosis not present

## 2020-02-09 DIAGNOSIS — H34233 Retinal artery branch occlusion, bilateral: Secondary | ICD-10-CM | POA: Diagnosis not present

## 2020-02-09 DIAGNOSIS — E46 Unspecified protein-calorie malnutrition: Secondary | ICD-10-CM | POA: Diagnosis not present

## 2020-02-09 DIAGNOSIS — H35371 Puckering of macula, right eye: Secondary | ICD-10-CM | POA: Diagnosis not present

## 2020-02-09 DIAGNOSIS — N138 Other obstructive and reflux uropathy: Secondary | ICD-10-CM | POA: Diagnosis not present

## 2020-02-09 DIAGNOSIS — M549 Dorsalgia, unspecified: Secondary | ICD-10-CM | POA: Diagnosis not present

## 2020-02-09 DIAGNOSIS — Z170 Estrogen receptor positive status [ER+]: Secondary | ICD-10-CM | POA: Diagnosis not present

## 2020-02-09 DIAGNOSIS — R0602 Shortness of breath: Secondary | ICD-10-CM | POA: Diagnosis not present

## 2020-02-09 DIAGNOSIS — N1831 Chronic kidney disease, stage 3a: Secondary | ICD-10-CM | POA: Diagnosis not present

## 2020-02-09 DIAGNOSIS — K449 Diaphragmatic hernia without obstruction or gangrene: Secondary | ICD-10-CM | POA: Diagnosis not present

## 2020-02-09 DIAGNOSIS — I110 Hypertensive heart disease with heart failure: Secondary | ICD-10-CM | POA: Diagnosis not present

## 2020-02-09 DIAGNOSIS — C44619 Basal cell carcinoma of skin of left upper limb, including shoulder: Secondary | ICD-10-CM | POA: Diagnosis not present

## 2020-02-09 DIAGNOSIS — Z171 Estrogen receptor negative status [ER-]: Secondary | ICD-10-CM | POA: Diagnosis not present

## 2020-02-09 DIAGNOSIS — K529 Noninfective gastroenteritis and colitis, unspecified: Secondary | ICD-10-CM | POA: Diagnosis not present

## 2020-02-09 DIAGNOSIS — M62838 Other muscle spasm: Secondary | ICD-10-CM | POA: Diagnosis not present

## 2020-02-09 DIAGNOSIS — M810 Age-related osteoporosis without current pathological fracture: Secondary | ICD-10-CM | POA: Diagnosis not present

## 2020-02-09 DIAGNOSIS — R221 Localized swelling, mass and lump, neck: Secondary | ICD-10-CM | POA: Diagnosis not present

## 2020-02-09 DIAGNOSIS — L03221 Cellulitis of neck: Secondary | ICD-10-CM | POA: Diagnosis not present

## 2020-02-09 DIAGNOSIS — L03031 Cellulitis of right toe: Secondary | ICD-10-CM | POA: Diagnosis not present

## 2020-02-09 DIAGNOSIS — N529 Male erectile dysfunction, unspecified: Secondary | ICD-10-CM | POA: Diagnosis not present

## 2020-02-09 DIAGNOSIS — F324 Major depressive disorder, single episode, in partial remission: Secondary | ICD-10-CM | POA: Diagnosis not present

## 2020-02-09 DIAGNOSIS — G43909 Migraine, unspecified, not intractable, without status migrainosus: Secondary | ICD-10-CM | POA: Diagnosis not present

## 2020-02-09 DIAGNOSIS — H40033 Anatomical narrow angle, bilateral: Secondary | ICD-10-CM | POA: Diagnosis not present

## 2020-02-09 DIAGNOSIS — M5136 Other intervertebral disc degeneration, lumbar region: Secondary | ICD-10-CM | POA: Diagnosis not present

## 2020-02-09 DIAGNOSIS — R918 Other nonspecific abnormal finding of lung field: Secondary | ICD-10-CM | POA: Diagnosis not present

## 2020-02-09 DIAGNOSIS — H5213 Myopia, bilateral: Secondary | ICD-10-CM | POA: Diagnosis not present

## 2020-02-09 DIAGNOSIS — Z515 Encounter for palliative care: Secondary | ICD-10-CM | POA: Diagnosis not present

## 2020-02-09 DIAGNOSIS — M19011 Primary osteoarthritis, right shoulder: Secondary | ICD-10-CM | POA: Diagnosis not present

## 2020-02-09 DIAGNOSIS — H35363 Drusen (degenerative) of macula, bilateral: Secondary | ICD-10-CM | POA: Diagnosis not present

## 2020-02-09 DIAGNOSIS — R748 Abnormal levels of other serum enzymes: Secondary | ICD-10-CM | POA: Diagnosis not present

## 2020-02-09 DIAGNOSIS — H04123 Dry eye syndrome of bilateral lacrimal glands: Secondary | ICD-10-CM | POA: Diagnosis not present

## 2020-02-09 DIAGNOSIS — L508 Other urticaria: Secondary | ICD-10-CM | POA: Diagnosis not present

## 2020-02-09 DIAGNOSIS — E669 Obesity, unspecified: Secondary | ICD-10-CM | POA: Diagnosis not present

## 2020-02-09 DIAGNOSIS — M0579 Rheumatoid arthritis with rheumatoid factor of multiple sites without organ or systems involvement: Secondary | ICD-10-CM | POA: Diagnosis not present

## 2020-02-09 DIAGNOSIS — I130 Hypertensive heart and chronic kidney disease with heart failure and stage 1 through stage 4 chronic kidney disease, or unspecified chronic kidney disease: Secondary | ICD-10-CM | POA: Diagnosis not present

## 2020-02-09 DIAGNOSIS — C189 Malignant neoplasm of colon, unspecified: Secondary | ICD-10-CM | POA: Diagnosis not present

## 2020-02-09 DIAGNOSIS — I6529 Occlusion and stenosis of unspecified carotid artery: Secondary | ICD-10-CM | POA: Diagnosis not present

## 2020-02-09 DIAGNOSIS — Z86718 Personal history of other venous thrombosis and embolism: Secondary | ICD-10-CM | POA: Diagnosis not present

## 2020-02-09 DIAGNOSIS — Z96651 Presence of right artificial knee joint: Secondary | ICD-10-CM | POA: Diagnosis not present

## 2020-02-09 DIAGNOSIS — E210 Primary hyperparathyroidism: Secondary | ICD-10-CM | POA: Diagnosis not present

## 2020-02-09 DIAGNOSIS — M48062 Spinal stenosis, lumbar region with neurogenic claudication: Secondary | ICD-10-CM | POA: Diagnosis not present

## 2020-02-09 DIAGNOSIS — Z7901 Long term (current) use of anticoagulants: Secondary | ICD-10-CM | POA: Diagnosis not present

## 2020-02-09 DIAGNOSIS — J0140 Acute pansinusitis, unspecified: Secondary | ICD-10-CM | POA: Diagnosis not present

## 2020-02-09 DIAGNOSIS — H2513 Age-related nuclear cataract, bilateral: Secondary | ICD-10-CM | POA: Diagnosis not present

## 2020-02-09 DIAGNOSIS — L570 Actinic keratosis: Secondary | ICD-10-CM | POA: Diagnosis not present

## 2020-02-09 DIAGNOSIS — R6521 Severe sepsis with septic shock: Secondary | ICD-10-CM | POA: Diagnosis not present

## 2020-02-09 DIAGNOSIS — E1122 Type 2 diabetes mellitus with diabetic chronic kidney disease: Secondary | ICD-10-CM | POA: Diagnosis not present

## 2020-02-09 DIAGNOSIS — I6330 Cerebral infarction due to thrombosis of unspecified cerebral artery: Secondary | ICD-10-CM | POA: Diagnosis not present

## 2020-02-09 DIAGNOSIS — I129 Hypertensive chronic kidney disease with stage 1 through stage 4 chronic kidney disease, or unspecified chronic kidney disease: Secondary | ICD-10-CM | POA: Diagnosis not present

## 2020-02-09 DIAGNOSIS — R300 Dysuria: Secondary | ICD-10-CM | POA: Diagnosis not present

## 2020-02-09 DIAGNOSIS — E569 Vitamin deficiency, unspecified: Secondary | ICD-10-CM | POA: Diagnosis not present

## 2020-02-09 DIAGNOSIS — M62561 Muscle wasting and atrophy, not elsewhere classified, right lower leg: Secondary | ICD-10-CM | POA: Diagnosis not present

## 2020-02-09 DIAGNOSIS — E1152 Type 2 diabetes mellitus with diabetic peripheral angiopathy with gangrene: Secondary | ICD-10-CM | POA: Diagnosis not present

## 2020-02-09 DIAGNOSIS — M40202 Unspecified kyphosis, cervical region: Secondary | ICD-10-CM | POA: Diagnosis not present

## 2020-02-09 DIAGNOSIS — Z90722 Acquired absence of ovaries, bilateral: Secondary | ICD-10-CM | POA: Diagnosis not present

## 2020-02-09 DIAGNOSIS — F325 Major depressive disorder, single episode, in full remission: Secondary | ICD-10-CM | POA: Diagnosis not present

## 2020-02-09 DIAGNOSIS — M9903 Segmental and somatic dysfunction of lumbar region: Secondary | ICD-10-CM | POA: Diagnosis not present

## 2020-02-09 DIAGNOSIS — R799 Abnormal finding of blood chemistry, unspecified: Secondary | ICD-10-CM | POA: Diagnosis not present

## 2020-02-09 DIAGNOSIS — R310 Gross hematuria: Secondary | ICD-10-CM | POA: Diagnosis not present

## 2020-02-09 DIAGNOSIS — Z90411 Acquired partial absence of pancreas: Secondary | ICD-10-CM | POA: Diagnosis not present

## 2020-02-09 DIAGNOSIS — H34833 Tributary (branch) retinal vein occlusion, bilateral, with macular edema: Secondary | ICD-10-CM | POA: Diagnosis not present

## 2020-02-09 DIAGNOSIS — D801 Nonfamilial hypogammaglobulinemia: Secondary | ICD-10-CM | POA: Diagnosis not present

## 2020-02-09 DIAGNOSIS — Z7984 Long term (current) use of oral hypoglycemic drugs: Secondary | ICD-10-CM | POA: Diagnosis not present

## 2020-02-09 DIAGNOSIS — Z981 Arthrodesis status: Secondary | ICD-10-CM | POA: Diagnosis not present

## 2020-02-09 DIAGNOSIS — I2729 Other secondary pulmonary hypertension: Secondary | ICD-10-CM | POA: Diagnosis not present

## 2020-02-09 DIAGNOSIS — I519 Heart disease, unspecified: Secondary | ICD-10-CM | POA: Diagnosis not present

## 2020-02-09 DIAGNOSIS — J189 Pneumonia, unspecified organism: Secondary | ICD-10-CM | POA: Diagnosis not present

## 2020-02-09 DIAGNOSIS — C50212 Malignant neoplasm of upper-inner quadrant of left female breast: Secondary | ICD-10-CM | POA: Diagnosis not present

## 2020-02-09 DIAGNOSIS — A419 Sepsis, unspecified organism: Secondary | ICD-10-CM | POA: Diagnosis not present

## 2020-02-09 DIAGNOSIS — Z853 Personal history of malignant neoplasm of breast: Secondary | ICD-10-CM | POA: Diagnosis not present

## 2020-02-09 DIAGNOSIS — Z79891 Long term (current) use of opiate analgesic: Secondary | ICD-10-CM | POA: Diagnosis not present

## 2020-02-09 DIAGNOSIS — R195 Other fecal abnormalities: Secondary | ICD-10-CM | POA: Diagnosis not present

## 2020-02-09 DIAGNOSIS — I480 Paroxysmal atrial fibrillation: Secondary | ICD-10-CM | POA: Diagnosis not present

## 2020-02-09 DIAGNOSIS — I509 Heart failure, unspecified: Secondary | ICD-10-CM | POA: Diagnosis not present

## 2020-02-09 DIAGNOSIS — M1711 Unilateral primary osteoarthritis, right knee: Secondary | ICD-10-CM | POA: Diagnosis not present

## 2020-02-09 DIAGNOSIS — Z8546 Personal history of malignant neoplasm of prostate: Secondary | ICD-10-CM | POA: Diagnosis not present

## 2020-02-09 DIAGNOSIS — G35 Multiple sclerosis: Secondary | ICD-10-CM | POA: Diagnosis not present

## 2020-02-09 DIAGNOSIS — J4520 Mild intermittent asthma, uncomplicated: Secondary | ICD-10-CM | POA: Diagnosis not present

## 2020-02-09 DIAGNOSIS — D539 Nutritional anemia, unspecified: Secondary | ICD-10-CM | POA: Diagnosis not present

## 2020-02-09 DIAGNOSIS — R197 Diarrhea, unspecified: Secondary | ICD-10-CM | POA: Diagnosis not present

## 2020-02-09 DIAGNOSIS — C44219 Basal cell carcinoma of skin of left ear and external auricular canal: Secondary | ICD-10-CM | POA: Diagnosis not present

## 2020-02-09 DIAGNOSIS — C679 Malignant neoplasm of bladder, unspecified: Secondary | ICD-10-CM | POA: Diagnosis not present

## 2020-02-09 DIAGNOSIS — E860 Dehydration: Secondary | ICD-10-CM | POA: Diagnosis not present

## 2020-02-09 DIAGNOSIS — R8290 Unspecified abnormal findings in urine: Secondary | ICD-10-CM | POA: Diagnosis not present

## 2020-02-09 DIAGNOSIS — R319 Hematuria, unspecified: Secondary | ICD-10-CM | POA: Diagnosis not present

## 2020-02-09 DIAGNOSIS — M8580 Other specified disorders of bone density and structure, unspecified site: Secondary | ICD-10-CM | POA: Diagnosis not present

## 2020-02-09 DIAGNOSIS — M0570 Rheumatoid arthritis with rheumatoid factor of unspecified site without organ or systems involvement: Secondary | ICD-10-CM | POA: Diagnosis not present

## 2020-02-09 DIAGNOSIS — M545 Low back pain: Secondary | ICD-10-CM | POA: Diagnosis not present

## 2020-02-09 DIAGNOSIS — N132 Hydronephrosis with renal and ureteral calculous obstruction: Secondary | ICD-10-CM | POA: Diagnosis not present

## 2020-02-09 DIAGNOSIS — S32010D Wedge compression fracture of first lumbar vertebra, subsequent encounter for fracture with routine healing: Secondary | ICD-10-CM | POA: Diagnosis not present

## 2020-02-09 DIAGNOSIS — I5022 Chronic systolic (congestive) heart failure: Secondary | ICD-10-CM | POA: Diagnosis not present

## 2020-02-09 DIAGNOSIS — E1142 Type 2 diabetes mellitus with diabetic polyneuropathy: Secondary | ICD-10-CM | POA: Diagnosis not present

## 2020-02-09 DIAGNOSIS — D509 Iron deficiency anemia, unspecified: Secondary | ICD-10-CM | POA: Diagnosis not present

## 2020-02-09 DIAGNOSIS — Z66 Do not resuscitate: Secondary | ICD-10-CM | POA: Diagnosis not present

## 2020-02-09 DIAGNOSIS — M542 Cervicalgia: Secondary | ICD-10-CM | POA: Diagnosis not present

## 2020-02-09 DIAGNOSIS — H33021 Retinal detachment with multiple breaks, right eye: Secondary | ICD-10-CM | POA: Diagnosis not present

## 2020-02-09 DIAGNOSIS — I5043 Acute on chronic combined systolic (congestive) and diastolic (congestive) heart failure: Secondary | ICD-10-CM | POA: Diagnosis not present

## 2020-02-09 DIAGNOSIS — I4891 Unspecified atrial fibrillation: Secondary | ICD-10-CM | POA: Diagnosis not present

## 2020-02-09 DIAGNOSIS — E871 Hypo-osmolality and hyponatremia: Secondary | ICD-10-CM | POA: Diagnosis not present

## 2020-02-09 DIAGNOSIS — C7A8 Other malignant neuroendocrine tumors: Secondary | ICD-10-CM | POA: Diagnosis not present

## 2020-02-09 DIAGNOSIS — G8918 Other acute postprocedural pain: Secondary | ICD-10-CM | POA: Diagnosis not present

## 2020-02-09 DIAGNOSIS — J3089 Other allergic rhinitis: Secondary | ICD-10-CM | POA: Diagnosis not present

## 2020-02-09 DIAGNOSIS — H401113 Primary open-angle glaucoma, right eye, severe stage: Secondary | ICD-10-CM | POA: Diagnosis not present

## 2020-02-09 DIAGNOSIS — I361 Nonrheumatic tricuspid (valve) insufficiency: Secondary | ICD-10-CM | POA: Diagnosis not present

## 2020-02-09 DIAGNOSIS — R338 Other retention of urine: Secondary | ICD-10-CM | POA: Diagnosis not present

## 2020-02-09 DIAGNOSIS — S83221A Peripheral tear of medial meniscus, current injury, right knee, initial encounter: Secondary | ICD-10-CM | POA: Diagnosis not present

## 2020-02-09 DIAGNOSIS — R509 Fever, unspecified: Secondary | ICD-10-CM | POA: Diagnosis not present

## 2020-02-09 DIAGNOSIS — E039 Hypothyroidism, unspecified: Secondary | ICD-10-CM | POA: Diagnosis not present

## 2020-02-09 DIAGNOSIS — Z993 Dependence on wheelchair: Secondary | ICD-10-CM | POA: Diagnosis not present

## 2020-02-09 DIAGNOSIS — R0683 Snoring: Secondary | ICD-10-CM | POA: Diagnosis not present

## 2020-02-09 DIAGNOSIS — R1313 Dysphagia, pharyngeal phase: Secondary | ICD-10-CM | POA: Diagnosis not present

## 2020-02-09 DIAGNOSIS — N398 Other specified disorders of urinary system: Secondary | ICD-10-CM | POA: Diagnosis not present

## 2020-02-09 DIAGNOSIS — R55 Syncope and collapse: Secondary | ICD-10-CM | POA: Diagnosis not present

## 2020-02-09 DIAGNOSIS — M5412 Radiculopathy, cervical region: Secondary | ICD-10-CM | POA: Diagnosis not present

## 2020-02-09 DIAGNOSIS — K228 Other specified diseases of esophagus: Secondary | ICD-10-CM | POA: Diagnosis not present

## 2020-02-09 DIAGNOSIS — D692 Other nonthrombocytopenic purpura: Secondary | ICD-10-CM | POA: Diagnosis not present

## 2020-02-09 DIAGNOSIS — R1312 Dysphagia, oropharyngeal phase: Secondary | ICD-10-CM | POA: Diagnosis not present

## 2020-02-09 DIAGNOSIS — L609 Nail disorder, unspecified: Secondary | ICD-10-CM | POA: Diagnosis not present

## 2020-02-09 DIAGNOSIS — Z86711 Personal history of pulmonary embolism: Secondary | ICD-10-CM | POA: Diagnosis not present

## 2020-02-09 DIAGNOSIS — I2609 Other pulmonary embolism with acute cor pulmonale: Secondary | ICD-10-CM | POA: Diagnosis not present

## 2020-02-09 DIAGNOSIS — Z5111 Encounter for antineoplastic chemotherapy: Secondary | ICD-10-CM | POA: Diagnosis not present

## 2020-02-09 DIAGNOSIS — E781 Pure hyperglyceridemia: Secondary | ICD-10-CM | POA: Diagnosis not present

## 2020-02-09 DIAGNOSIS — L578 Other skin changes due to chronic exposure to nonionizing radiation: Secondary | ICD-10-CM | POA: Diagnosis not present

## 2020-02-09 DIAGNOSIS — C7931 Secondary malignant neoplasm of brain: Secondary | ICD-10-CM | POA: Diagnosis not present

## 2020-02-09 DIAGNOSIS — E8770 Fluid overload, unspecified: Secondary | ICD-10-CM | POA: Diagnosis not present

## 2020-02-09 DIAGNOSIS — E118 Type 2 diabetes mellitus with unspecified complications: Secondary | ICD-10-CM | POA: Diagnosis not present

## 2020-02-09 DIAGNOSIS — R262 Difficulty in walking, not elsewhere classified: Secondary | ICD-10-CM | POA: Diagnosis not present

## 2020-02-09 DIAGNOSIS — M25552 Pain in left hip: Secondary | ICD-10-CM | POA: Diagnosis not present

## 2020-02-09 DIAGNOSIS — H402223 Chronic angle-closure glaucoma, left eye, severe stage: Secondary | ICD-10-CM | POA: Diagnosis not present

## 2020-02-09 DIAGNOSIS — M25561 Pain in right knee: Secondary | ICD-10-CM | POA: Diagnosis not present

## 2020-02-09 DIAGNOSIS — K8689 Other specified diseases of pancreas: Secondary | ICD-10-CM | POA: Diagnosis not present

## 2020-02-09 DIAGNOSIS — G301 Alzheimer's disease with late onset: Secondary | ICD-10-CM | POA: Diagnosis not present

## 2020-02-09 DIAGNOSIS — I472 Ventricular tachycardia: Secondary | ICD-10-CM | POA: Diagnosis not present

## 2020-02-09 DIAGNOSIS — S14109S Unspecified injury at unspecified level of cervical spinal cord, sequela: Secondary | ICD-10-CM | POA: Diagnosis not present

## 2020-02-09 DIAGNOSIS — G4733 Obstructive sleep apnea (adult) (pediatric): Secondary | ICD-10-CM | POA: Diagnosis not present

## 2020-02-09 DIAGNOSIS — E785 Hyperlipidemia, unspecified: Secondary | ICD-10-CM | POA: Diagnosis not present

## 2020-02-09 DIAGNOSIS — A411 Sepsis due to other specified staphylococcus: Secondary | ICD-10-CM | POA: Diagnosis not present

## 2020-02-09 DIAGNOSIS — L89321 Pressure ulcer of left buttock, stage 1: Secondary | ICD-10-CM | POA: Diagnosis not present

## 2020-02-09 DIAGNOSIS — S7292XD Unspecified fracture of left femur, subsequent encounter for closed fracture with routine healing: Secondary | ICD-10-CM | POA: Diagnosis not present

## 2020-02-09 DIAGNOSIS — E876 Hypokalemia: Secondary | ICD-10-CM | POA: Diagnosis not present

## 2020-02-09 DIAGNOSIS — Z85850 Personal history of malignant neoplasm of thyroid: Secondary | ICD-10-CM | POA: Diagnosis not present

## 2020-02-09 DIAGNOSIS — G4709 Other insomnia: Secondary | ICD-10-CM | POA: Diagnosis not present

## 2020-02-09 DIAGNOSIS — J0120 Acute ethmoidal sinusitis, unspecified: Secondary | ICD-10-CM | POA: Diagnosis not present

## 2020-02-09 DIAGNOSIS — K5900 Constipation, unspecified: Secondary | ICD-10-CM | POA: Diagnosis not present

## 2020-02-09 DIAGNOSIS — Z95828 Presence of other vascular implants and grafts: Secondary | ICD-10-CM | POA: Diagnosis not present

## 2020-02-09 DIAGNOSIS — S72141A Displaced intertrochanteric fracture of right femur, initial encounter for closed fracture: Secondary | ICD-10-CM | POA: Diagnosis not present

## 2020-02-09 DIAGNOSIS — E1169 Type 2 diabetes mellitus with other specified complication: Secondary | ICD-10-CM | POA: Diagnosis not present

## 2020-02-09 DIAGNOSIS — L89159 Pressure ulcer of sacral region, unspecified stage: Secondary | ICD-10-CM | POA: Diagnosis not present

## 2020-02-09 DIAGNOSIS — Z192 Hormone resistant malignancy status: Secondary | ICD-10-CM | POA: Diagnosis not present

## 2020-02-09 DIAGNOSIS — I469 Cardiac arrest, cause unspecified: Secondary | ICD-10-CM | POA: Diagnosis not present

## 2020-02-09 DIAGNOSIS — F319 Bipolar disorder, unspecified: Secondary | ICD-10-CM | POA: Diagnosis not present

## 2020-02-09 DIAGNOSIS — I951 Orthostatic hypotension: Secondary | ICD-10-CM | POA: Diagnosis not present

## 2020-02-09 DIAGNOSIS — D649 Anemia, unspecified: Secondary | ICD-10-CM | POA: Diagnosis not present

## 2020-02-09 DIAGNOSIS — E875 Hyperkalemia: Secondary | ICD-10-CM | POA: Diagnosis not present

## 2020-02-09 DIAGNOSIS — R4781 Slurred speech: Secondary | ICD-10-CM | POA: Diagnosis not present

## 2020-02-09 DIAGNOSIS — F419 Anxiety disorder, unspecified: Secondary | ICD-10-CM | POA: Diagnosis not present

## 2020-02-09 DIAGNOSIS — U071 COVID-19: Secondary | ICD-10-CM | POA: Diagnosis not present

## 2020-02-09 DIAGNOSIS — F339 Major depressive disorder, recurrent, unspecified: Secondary | ICD-10-CM | POA: Diagnosis not present

## 2020-02-09 DIAGNOSIS — D631 Anemia in chronic kidney disease: Secondary | ICD-10-CM | POA: Diagnosis not present

## 2020-02-09 DIAGNOSIS — M5126 Other intervertebral disc displacement, lumbar region: Secondary | ICD-10-CM | POA: Diagnosis not present

## 2020-02-09 DIAGNOSIS — R7989 Other specified abnormal findings of blood chemistry: Secondary | ICD-10-CM | POA: Diagnosis not present

## 2020-02-09 DIAGNOSIS — K7031 Alcoholic cirrhosis of liver with ascites: Secondary | ICD-10-CM | POA: Diagnosis not present

## 2020-02-09 DIAGNOSIS — S42214A Unspecified nondisplaced fracture of surgical neck of right humerus, initial encounter for closed fracture: Secondary | ICD-10-CM | POA: Diagnosis not present

## 2020-02-09 DIAGNOSIS — D234 Other benign neoplasm of skin of scalp and neck: Secondary | ICD-10-CM | POA: Diagnosis not present

## 2020-02-09 DIAGNOSIS — E890 Postprocedural hypothyroidism: Secondary | ICD-10-CM | POA: Diagnosis not present

## 2020-02-09 DIAGNOSIS — I272 Pulmonary hypertension, unspecified: Secondary | ICD-10-CM | POA: Diagnosis not present

## 2020-02-09 DIAGNOSIS — Z6841 Body Mass Index (BMI) 40.0 and over, adult: Secondary | ICD-10-CM | POA: Diagnosis not present

## 2020-02-09 DIAGNOSIS — M25662 Stiffness of left knee, not elsewhere classified: Secondary | ICD-10-CM | POA: Diagnosis not present

## 2020-02-09 DIAGNOSIS — I4819 Other persistent atrial fibrillation: Secondary | ICD-10-CM | POA: Diagnosis not present

## 2020-02-09 DIAGNOSIS — I2510 Atherosclerotic heart disease of native coronary artery without angina pectoris: Secondary | ICD-10-CM | POA: Diagnosis not present

## 2020-02-09 DIAGNOSIS — M62551 Muscle wasting and atrophy, not elsewhere classified, right thigh: Secondary | ICD-10-CM | POA: Diagnosis not present

## 2020-02-09 DIAGNOSIS — J90 Pleural effusion, not elsewhere classified: Secondary | ICD-10-CM | POA: Diagnosis not present

## 2020-02-09 DIAGNOSIS — I350 Nonrheumatic aortic (valve) stenosis: Secondary | ICD-10-CM | POA: Diagnosis not present

## 2020-02-09 DIAGNOSIS — I712 Thoracic aortic aneurysm, without rupture: Secondary | ICD-10-CM | POA: Diagnosis not present

## 2020-02-09 DIAGNOSIS — Z96612 Presence of left artificial shoulder joint: Secondary | ICD-10-CM | POA: Diagnosis not present

## 2020-02-09 DIAGNOSIS — R109 Unspecified abdominal pain: Secondary | ICD-10-CM | POA: Diagnosis not present

## 2020-02-09 DIAGNOSIS — Z20828 Contact with and (suspected) exposure to other viral communicable diseases: Secondary | ICD-10-CM | POA: Diagnosis not present

## 2020-02-09 DIAGNOSIS — Z9181 History of falling: Secondary | ICD-10-CM | POA: Diagnosis not present

## 2020-02-09 DIAGNOSIS — E440 Moderate protein-calorie malnutrition: Secondary | ICD-10-CM | POA: Diagnosis not present

## 2020-02-09 DIAGNOSIS — R279 Unspecified lack of coordination: Secondary | ICD-10-CM | POA: Diagnosis not present

## 2020-02-09 DIAGNOSIS — Z09 Encounter for follow-up examination after completed treatment for conditions other than malignant neoplasm: Secondary | ICD-10-CM | POA: Diagnosis not present

## 2020-02-09 DIAGNOSIS — H353132 Nonexudative age-related macular degeneration, bilateral, intermediate dry stage: Secondary | ICD-10-CM | POA: Diagnosis not present

## 2020-02-09 DIAGNOSIS — S3282XD Multiple fractures of pelvis without disruption of pelvic ring, subsequent encounter for fracture with routine healing: Secondary | ICD-10-CM | POA: Diagnosis not present

## 2020-02-09 DIAGNOSIS — J3081 Allergic rhinitis due to animal (cat) (dog) hair and dander: Secondary | ICD-10-CM | POA: Diagnosis not present

## 2020-02-09 DIAGNOSIS — N393 Stress incontinence (female) (male): Secondary | ICD-10-CM | POA: Diagnosis not present

## 2020-02-09 DIAGNOSIS — T783XXD Angioneurotic edema, subsequent encounter: Secondary | ICD-10-CM | POA: Diagnosis not present

## 2020-02-09 DIAGNOSIS — H3563 Retinal hemorrhage, bilateral: Secondary | ICD-10-CM | POA: Diagnosis not present

## 2020-02-09 DIAGNOSIS — L7211 Pilar cyst: Secondary | ICD-10-CM | POA: Diagnosis not present

## 2020-02-09 DIAGNOSIS — C44629 Squamous cell carcinoma of skin of left upper limb, including shoulder: Secondary | ICD-10-CM | POA: Diagnosis not present

## 2020-02-09 DIAGNOSIS — I1310 Hypertensive heart and chronic kidney disease without heart failure, with stage 1 through stage 4 chronic kidney disease, or unspecified chronic kidney disease: Secondary | ICD-10-CM | POA: Diagnosis not present

## 2020-02-09 DIAGNOSIS — N6489 Other specified disorders of breast: Secondary | ICD-10-CM | POA: Diagnosis not present

## 2020-02-09 DIAGNOSIS — F5105 Insomnia due to other mental disorder: Secondary | ICD-10-CM | POA: Diagnosis not present

## 2020-02-09 DIAGNOSIS — N950 Postmenopausal bleeding: Secondary | ICD-10-CM | POA: Diagnosis not present

## 2020-02-09 DIAGNOSIS — Z125 Encounter for screening for malignant neoplasm of prostate: Secondary | ICD-10-CM | POA: Diagnosis not present

## 2020-02-09 DIAGNOSIS — C4491 Basal cell carcinoma of skin, unspecified: Secondary | ICD-10-CM | POA: Diagnosis not present

## 2020-02-09 DIAGNOSIS — E10649 Type 1 diabetes mellitus with hypoglycemia without coma: Secondary | ICD-10-CM | POA: Diagnosis not present

## 2020-02-09 DIAGNOSIS — L89311 Pressure ulcer of right buttock, stage 1: Secondary | ICD-10-CM | POA: Diagnosis not present

## 2020-02-09 DIAGNOSIS — F3341 Major depressive disorder, recurrent, in partial remission: Secondary | ICD-10-CM | POA: Diagnosis not present

## 2020-02-09 DIAGNOSIS — B354 Tinea corporis: Secondary | ICD-10-CM | POA: Diagnosis not present

## 2020-02-09 DIAGNOSIS — N139 Obstructive and reflux uropathy, unspecified: Secondary | ICD-10-CM | POA: Diagnosis not present

## 2020-02-09 DIAGNOSIS — M5135 Other intervertebral disc degeneration, thoracolumbar region: Secondary | ICD-10-CM | POA: Diagnosis not present

## 2020-02-09 DIAGNOSIS — C155 Malignant neoplasm of lower third of esophagus: Secondary | ICD-10-CM | POA: Diagnosis not present

## 2020-02-09 DIAGNOSIS — S6991XA Unspecified injury of right wrist, hand and finger(s), initial encounter: Secondary | ICD-10-CM | POA: Diagnosis not present

## 2020-02-09 DIAGNOSIS — R21 Rash and other nonspecific skin eruption: Secondary | ICD-10-CM | POA: Diagnosis not present

## 2020-02-09 DIAGNOSIS — R278 Other lack of coordination: Secondary | ICD-10-CM | POA: Diagnosis not present

## 2020-02-09 DIAGNOSIS — H4053X0 Glaucoma secondary to other eye disorders, bilateral, stage unspecified: Secondary | ICD-10-CM | POA: Diagnosis not present

## 2020-02-09 DIAGNOSIS — I6523 Occlusion and stenosis of bilateral carotid arteries: Secondary | ICD-10-CM | POA: Diagnosis not present

## 2020-02-09 DIAGNOSIS — Z01812 Encounter for preprocedural laboratory examination: Secondary | ICD-10-CM | POA: Diagnosis not present

## 2020-02-09 DIAGNOSIS — I5033 Acute on chronic diastolic (congestive) heart failure: Secondary | ICD-10-CM | POA: Diagnosis not present

## 2020-02-09 DIAGNOSIS — R2689 Other abnormalities of gait and mobility: Secondary | ICD-10-CM | POA: Diagnosis not present

## 2020-02-09 DIAGNOSIS — Z01818 Encounter for other preprocedural examination: Secondary | ICD-10-CM | POA: Diagnosis not present

## 2020-02-09 DIAGNOSIS — E119 Type 2 diabetes mellitus without complications: Secondary | ICD-10-CM | POA: Diagnosis not present

## 2020-02-09 DIAGNOSIS — S46011D Strain of muscle(s) and tendon(s) of the rotator cuff of right shoulder, subsequent encounter: Secondary | ICD-10-CM | POA: Diagnosis not present

## 2020-02-09 DIAGNOSIS — Q132 Other congenital malformations of iris: Secondary | ICD-10-CM | POA: Diagnosis not present

## 2020-02-09 DIAGNOSIS — F5104 Psychophysiologic insomnia: Secondary | ICD-10-CM | POA: Diagnosis not present

## 2020-02-09 DIAGNOSIS — Z452 Encounter for adjustment and management of vascular access device: Secondary | ICD-10-CM | POA: Diagnosis not present

## 2020-02-09 DIAGNOSIS — K2970 Gastritis, unspecified, without bleeding: Secondary | ICD-10-CM | POA: Diagnosis not present

## 2020-02-09 DIAGNOSIS — R296 Repeated falls: Secondary | ICD-10-CM | POA: Diagnosis not present

## 2020-02-09 DIAGNOSIS — K623 Rectal prolapse: Secondary | ICD-10-CM | POA: Diagnosis not present

## 2020-02-09 DIAGNOSIS — Z8701 Personal history of pneumonia (recurrent): Secondary | ICD-10-CM | POA: Diagnosis not present

## 2020-02-09 DIAGNOSIS — E291 Testicular hypofunction: Secondary | ICD-10-CM | POA: Diagnosis not present

## 2020-02-09 DIAGNOSIS — H8110 Benign paroxysmal vertigo, unspecified ear: Secondary | ICD-10-CM | POA: Diagnosis not present

## 2020-02-09 DIAGNOSIS — E7849 Other hyperlipidemia: Secondary | ICD-10-CM | POA: Diagnosis not present

## 2020-02-09 DIAGNOSIS — K862 Cyst of pancreas: Secondary | ICD-10-CM | POA: Diagnosis not present

## 2020-02-09 DIAGNOSIS — G96819 Other intracranial hypotension: Secondary | ICD-10-CM | POA: Diagnosis not present

## 2020-02-09 DIAGNOSIS — J4530 Mild persistent asthma, uncomplicated: Secondary | ICD-10-CM | POA: Diagnosis not present

## 2020-02-09 DIAGNOSIS — N186 End stage renal disease: Secondary | ICD-10-CM | POA: Diagnosis not present

## 2020-02-09 DIAGNOSIS — Z789 Other specified health status: Secondary | ICD-10-CM | POA: Diagnosis not present

## 2020-02-09 DIAGNOSIS — K635 Polyp of colon: Secondary | ICD-10-CM | POA: Diagnosis not present

## 2020-02-09 DIAGNOSIS — I25118 Atherosclerotic heart disease of native coronary artery with other forms of angina pectoris: Secondary | ICD-10-CM | POA: Diagnosis not present

## 2020-02-09 DIAGNOSIS — I5031 Acute diastolic (congestive) heart failure: Secondary | ICD-10-CM | POA: Diagnosis not present

## 2020-02-09 DIAGNOSIS — L821 Other seborrheic keratosis: Secondary | ICD-10-CM | POA: Diagnosis not present

## 2020-02-09 DIAGNOSIS — H3582 Retinal ischemia: Secondary | ICD-10-CM | POA: Diagnosis not present

## 2020-02-09 DIAGNOSIS — R1013 Epigastric pain: Secondary | ICD-10-CM | POA: Diagnosis not present

## 2020-02-09 DIAGNOSIS — R600 Localized edema: Secondary | ICD-10-CM | POA: Diagnosis not present

## 2020-02-09 DIAGNOSIS — I82531 Chronic embolism and thrombosis of right popliteal vein: Secondary | ICD-10-CM | POA: Diagnosis not present

## 2020-02-09 DIAGNOSIS — M25462 Effusion, left knee: Secondary | ICD-10-CM | POA: Diagnosis not present

## 2020-02-09 DIAGNOSIS — Z791 Long term (current) use of non-steroidal anti-inflammatories (NSAID): Secondary | ICD-10-CM | POA: Diagnosis not present

## 2020-02-09 DIAGNOSIS — J8410 Pulmonary fibrosis, unspecified: Secondary | ICD-10-CM | POA: Diagnosis not present

## 2020-02-09 DIAGNOSIS — M5416 Radiculopathy, lumbar region: Secondary | ICD-10-CM | POA: Diagnosis not present

## 2020-02-09 DIAGNOSIS — K648 Other hemorrhoids: Secondary | ICD-10-CM | POA: Diagnosis not present

## 2020-02-09 DIAGNOSIS — D1801 Hemangioma of skin and subcutaneous tissue: Secondary | ICD-10-CM | POA: Diagnosis not present

## 2020-02-09 DIAGNOSIS — M179 Osteoarthritis of knee, unspecified: Secondary | ICD-10-CM | POA: Diagnosis not present

## 2020-02-10 DIAGNOSIS — C44722 Squamous cell carcinoma of skin of right lower limb, including hip: Secondary | ICD-10-CM | POA: Diagnosis not present

## 2020-02-10 DIAGNOSIS — M797 Fibromyalgia: Secondary | ICD-10-CM | POA: Diagnosis not present

## 2020-02-10 DIAGNOSIS — Z124 Encounter for screening for malignant neoplasm of cervix: Secondary | ICD-10-CM | POA: Diagnosis not present

## 2020-02-10 DIAGNOSIS — I5031 Acute diastolic (congestive) heart failure: Secondary | ICD-10-CM | POA: Diagnosis not present

## 2020-02-10 DIAGNOSIS — I6523 Occlusion and stenosis of bilateral carotid arteries: Secondary | ICD-10-CM | POA: Diagnosis not present

## 2020-02-10 DIAGNOSIS — I6623 Occlusion and stenosis of bilateral posterior cerebral arteries: Secondary | ICD-10-CM | POA: Diagnosis not present

## 2020-02-10 DIAGNOSIS — D519 Vitamin B12 deficiency anemia, unspecified: Secondary | ICD-10-CM | POA: Diagnosis not present

## 2020-02-10 DIAGNOSIS — A419 Sepsis, unspecified organism: Secondary | ICD-10-CM | POA: Diagnosis not present

## 2020-02-10 DIAGNOSIS — Z9079 Acquired absence of other genital organ(s): Secondary | ICD-10-CM | POA: Diagnosis not present

## 2020-02-10 DIAGNOSIS — Z515 Encounter for palliative care: Secondary | ICD-10-CM | POA: Diagnosis not present

## 2020-02-10 DIAGNOSIS — Z9181 History of falling: Secondary | ICD-10-CM | POA: Diagnosis not present

## 2020-02-10 DIAGNOSIS — G4700 Insomnia, unspecified: Secondary | ICD-10-CM | POA: Diagnosis not present

## 2020-02-10 DIAGNOSIS — M2550 Pain in unspecified joint: Secondary | ICD-10-CM | POA: Diagnosis not present

## 2020-02-10 DIAGNOSIS — H2189 Other specified disorders of iris and ciliary body: Secondary | ICD-10-CM | POA: Diagnosis not present

## 2020-02-10 DIAGNOSIS — K581 Irritable bowel syndrome with constipation: Secondary | ICD-10-CM | POA: Diagnosis not present

## 2020-02-10 DIAGNOSIS — Z6841 Body Mass Index (BMI) 40.0 and over, adult: Secondary | ICD-10-CM | POA: Diagnosis not present

## 2020-02-10 DIAGNOSIS — R300 Dysuria: Secondary | ICD-10-CM | POA: Diagnosis not present

## 2020-02-10 DIAGNOSIS — L89002 Pressure ulcer of unspecified elbow, stage 2: Secondary | ICD-10-CM | POA: Diagnosis not present

## 2020-02-10 DIAGNOSIS — Z791 Long term (current) use of non-steroidal anti-inflammatories (NSAID): Secondary | ICD-10-CM | POA: Diagnosis not present

## 2020-02-10 DIAGNOSIS — F0390 Unspecified dementia without behavioral disturbance: Secondary | ICD-10-CM | POA: Diagnosis not present

## 2020-02-10 DIAGNOSIS — Z95828 Presence of other vascular implants and grafts: Secondary | ICD-10-CM | POA: Diagnosis not present

## 2020-02-10 DIAGNOSIS — Z83518 Family history of other specified eye disorder: Secondary | ICD-10-CM | POA: Diagnosis not present

## 2020-02-10 DIAGNOSIS — E1122 Type 2 diabetes mellitus with diabetic chronic kidney disease: Secondary | ICD-10-CM | POA: Diagnosis not present

## 2020-02-10 DIAGNOSIS — Z90721 Acquired absence of ovaries, unilateral: Secondary | ICD-10-CM | POA: Diagnosis not present

## 2020-02-10 DIAGNOSIS — N1831 Chronic kidney disease, stage 3a: Secondary | ICD-10-CM | POA: Diagnosis not present

## 2020-02-10 DIAGNOSIS — Z9221 Personal history of antineoplastic chemotherapy: Secondary | ICD-10-CM | POA: Diagnosis not present

## 2020-02-10 DIAGNOSIS — B964 Proteus (mirabilis) (morganii) as the cause of diseases classified elsewhere: Secondary | ICD-10-CM | POA: Diagnosis not present

## 2020-02-10 DIAGNOSIS — Z170 Estrogen receptor positive status [ER+]: Secondary | ICD-10-CM | POA: Diagnosis not present

## 2020-02-10 DIAGNOSIS — I5042 Chronic combined systolic (congestive) and diastolic (congestive) heart failure: Secondary | ICD-10-CM | POA: Diagnosis not present

## 2020-02-10 DIAGNOSIS — N4289 Other specified disorders of prostate: Secondary | ICD-10-CM | POA: Diagnosis not present

## 2020-02-10 DIAGNOSIS — R200 Anesthesia of skin: Secondary | ICD-10-CM | POA: Diagnosis not present

## 2020-02-10 DIAGNOSIS — M25622 Stiffness of left elbow, not elsewhere classified: Secondary | ICD-10-CM | POA: Diagnosis not present

## 2020-02-10 DIAGNOSIS — Z79811 Long term (current) use of aromatase inhibitors: Secondary | ICD-10-CM | POA: Diagnosis not present

## 2020-02-10 DIAGNOSIS — M21372 Foot drop, left foot: Secondary | ICD-10-CM | POA: Diagnosis not present

## 2020-02-10 DIAGNOSIS — C7989 Secondary malignant neoplasm of other specified sites: Secondary | ICD-10-CM | POA: Diagnosis not present

## 2020-02-10 DIAGNOSIS — Z96653 Presence of artificial knee joint, bilateral: Secondary | ICD-10-CM | POA: Diagnosis not present

## 2020-02-10 DIAGNOSIS — I63529 Cerebral infarction due to unspecified occlusion or stenosis of unspecified anterior cerebral artery: Secondary | ICD-10-CM | POA: Diagnosis not present

## 2020-02-10 DIAGNOSIS — Z1231 Encounter for screening mammogram for malignant neoplasm of breast: Secondary | ICD-10-CM | POA: Diagnosis not present

## 2020-02-10 DIAGNOSIS — C50111 Malignant neoplasm of central portion of right female breast: Secondary | ICD-10-CM | POA: Diagnosis not present

## 2020-02-10 DIAGNOSIS — J189 Pneumonia, unspecified organism: Secondary | ICD-10-CM | POA: Diagnosis not present

## 2020-02-10 DIAGNOSIS — C3431 Malignant neoplasm of lower lobe, right bronchus or lung: Secondary | ICD-10-CM | POA: Diagnosis not present

## 2020-02-10 DIAGNOSIS — M9905 Segmental and somatic dysfunction of pelvic region: Secondary | ICD-10-CM | POA: Diagnosis not present

## 2020-02-10 DIAGNOSIS — Z87440 Personal history of urinary (tract) infections: Secondary | ICD-10-CM | POA: Diagnosis not present

## 2020-02-10 DIAGNOSIS — Z8349 Family history of other endocrine, nutritional and metabolic diseases: Secondary | ICD-10-CM | POA: Diagnosis not present

## 2020-02-10 DIAGNOSIS — D0512 Intraductal carcinoma in situ of left breast: Secondary | ICD-10-CM | POA: Diagnosis not present

## 2020-02-10 DIAGNOSIS — F4321 Adjustment disorder with depressed mood: Secondary | ICD-10-CM | POA: Diagnosis not present

## 2020-02-10 DIAGNOSIS — E876 Hypokalemia: Secondary | ICD-10-CM | POA: Diagnosis not present

## 2020-02-10 DIAGNOSIS — R32 Unspecified urinary incontinence: Secondary | ICD-10-CM | POA: Diagnosis not present

## 2020-02-10 DIAGNOSIS — I25810 Atherosclerosis of coronary artery bypass graft(s) without angina pectoris: Secondary | ICD-10-CM | POA: Diagnosis not present

## 2020-02-10 DIAGNOSIS — Z8616 Personal history of COVID-19: Secondary | ICD-10-CM | POA: Diagnosis not present

## 2020-02-10 DIAGNOSIS — R471 Dysarthria and anarthria: Secondary | ICD-10-CM | POA: Diagnosis not present

## 2020-02-10 DIAGNOSIS — E1169 Type 2 diabetes mellitus with other specified complication: Secondary | ICD-10-CM | POA: Diagnosis not present

## 2020-02-10 DIAGNOSIS — G8929 Other chronic pain: Secondary | ICD-10-CM | POA: Diagnosis not present

## 2020-02-10 DIAGNOSIS — Z780 Asymptomatic menopausal state: Secondary | ICD-10-CM | POA: Diagnosis not present

## 2020-02-10 DIAGNOSIS — Z978 Presence of other specified devices: Secondary | ICD-10-CM | POA: Diagnosis not present

## 2020-02-10 DIAGNOSIS — I083 Combined rheumatic disorders of mitral, aortic and tricuspid valves: Secondary | ICD-10-CM | POA: Diagnosis not present

## 2020-02-10 DIAGNOSIS — Z79890 Hormone replacement therapy (postmenopausal): Secondary | ICD-10-CM | POA: Diagnosis not present

## 2020-02-10 DIAGNOSIS — I6612 Occlusion and stenosis of left anterior cerebral artery: Secondary | ICD-10-CM | POA: Diagnosis not present

## 2020-02-10 DIAGNOSIS — K5900 Constipation, unspecified: Secondary | ICD-10-CM | POA: Diagnosis not present

## 2020-02-10 DIAGNOSIS — M75101 Unspecified rotator cuff tear or rupture of right shoulder, not specified as traumatic: Secondary | ICD-10-CM | POA: Diagnosis not present

## 2020-02-10 DIAGNOSIS — S143XXD Injury of brachial plexus, subsequent encounter: Secondary | ICD-10-CM | POA: Diagnosis not present

## 2020-02-10 DIAGNOSIS — Z8572 Personal history of non-Hodgkin lymphomas: Secondary | ICD-10-CM | POA: Diagnosis not present

## 2020-02-10 DIAGNOSIS — E1139 Type 2 diabetes mellitus with other diabetic ophthalmic complication: Secondary | ICD-10-CM | POA: Diagnosis not present

## 2020-02-10 DIAGNOSIS — C9210 Chronic myeloid leukemia, BCR/ABL-positive, not having achieved remission: Secondary | ICD-10-CM | POA: Diagnosis not present

## 2020-02-10 DIAGNOSIS — Z961 Presence of intraocular lens: Secondary | ICD-10-CM | POA: Diagnosis not present

## 2020-02-10 DIAGNOSIS — F0150 Vascular dementia without behavioral disturbance: Secondary | ICD-10-CM | POA: Diagnosis not present

## 2020-02-10 DIAGNOSIS — M5412 Radiculopathy, cervical region: Secondary | ICD-10-CM | POA: Diagnosis not present

## 2020-02-10 DIAGNOSIS — E0821 Diabetes mellitus due to underlying condition with diabetic nephropathy: Secondary | ICD-10-CM | POA: Diagnosis not present

## 2020-02-10 DIAGNOSIS — M222X2 Patellofemoral disorders, left knee: Secondary | ICD-10-CM | POA: Diagnosis not present

## 2020-02-10 DIAGNOSIS — I639 Cerebral infarction, unspecified: Secondary | ICD-10-CM | POA: Diagnosis not present

## 2020-02-10 DIAGNOSIS — I723 Aneurysm of iliac artery: Secondary | ICD-10-CM | POA: Diagnosis not present

## 2020-02-10 DIAGNOSIS — K209 Esophagitis, unspecified without bleeding: Secondary | ICD-10-CM | POA: Diagnosis not present

## 2020-02-10 DIAGNOSIS — I739 Peripheral vascular disease, unspecified: Secondary | ICD-10-CM | POA: Diagnosis not present

## 2020-02-10 DIAGNOSIS — I5033 Acute on chronic diastolic (congestive) heart failure: Secondary | ICD-10-CM | POA: Diagnosis not present

## 2020-02-10 DIAGNOSIS — Z83438 Family history of other disorder of lipoprotein metabolism and other lipidemia: Secondary | ICD-10-CM | POA: Diagnosis not present

## 2020-02-10 DIAGNOSIS — R1310 Dysphagia, unspecified: Secondary | ICD-10-CM | POA: Diagnosis not present

## 2020-02-10 DIAGNOSIS — D692 Other nonthrombocytopenic purpura: Secondary | ICD-10-CM | POA: Diagnosis not present

## 2020-02-10 DIAGNOSIS — S72351D Displaced comminuted fracture of shaft of right femur, subsequent encounter for closed fracture with routine healing: Secondary | ICD-10-CM | POA: Diagnosis not present

## 2020-02-10 DIAGNOSIS — J4540 Moderate persistent asthma, uncomplicated: Secondary | ICD-10-CM | POA: Diagnosis not present

## 2020-02-10 DIAGNOSIS — I2510 Atherosclerotic heart disease of native coronary artery without angina pectoris: Secondary | ICD-10-CM | POA: Diagnosis not present

## 2020-02-10 DIAGNOSIS — R69 Illness, unspecified: Secondary | ICD-10-CM | POA: Diagnosis not present

## 2020-02-10 DIAGNOSIS — Q211 Atrial septal defect: Secondary | ICD-10-CM | POA: Diagnosis not present

## 2020-02-10 DIAGNOSIS — N400 Enlarged prostate without lower urinary tract symptoms: Secondary | ICD-10-CM | POA: Diagnosis not present

## 2020-02-10 DIAGNOSIS — I714 Abdominal aortic aneurysm, without rupture: Secondary | ICD-10-CM | POA: Diagnosis not present

## 2020-02-10 DIAGNOSIS — Z23 Encounter for immunization: Secondary | ICD-10-CM | POA: Diagnosis not present

## 2020-02-10 DIAGNOSIS — S72402E Unspecified fracture of lower end of left femur, subsequent encounter for open fracture type I or II with routine healing: Secondary | ICD-10-CM | POA: Diagnosis not present

## 2020-02-10 DIAGNOSIS — C801 Malignant (primary) neoplasm, unspecified: Secondary | ICD-10-CM | POA: Diagnosis not present

## 2020-02-10 DIAGNOSIS — Z96652 Presence of left artificial knee joint: Secondary | ICD-10-CM | POA: Diagnosis not present

## 2020-02-10 DIAGNOSIS — Z66 Do not resuscitate: Secondary | ICD-10-CM | POA: Diagnosis not present

## 2020-02-10 DIAGNOSIS — M25511 Pain in right shoulder: Secondary | ICD-10-CM | POA: Diagnosis not present

## 2020-02-10 DIAGNOSIS — K648 Other hemorrhoids: Secondary | ICD-10-CM | POA: Diagnosis not present

## 2020-02-10 DIAGNOSIS — R498 Other voice and resonance disorders: Secondary | ICD-10-CM | POA: Diagnosis not present

## 2020-02-10 DIAGNOSIS — M1711 Unilateral primary osteoarthritis, right knee: Secondary | ICD-10-CM | POA: Diagnosis not present

## 2020-02-10 DIAGNOSIS — H906 Mixed conductive and sensorineural hearing loss, bilateral: Secondary | ICD-10-CM | POA: Diagnosis not present

## 2020-02-10 DIAGNOSIS — I69393 Ataxia following cerebral infarction: Secondary | ICD-10-CM | POA: Diagnosis not present

## 2020-02-10 DIAGNOSIS — K603 Anal fistula: Secondary | ICD-10-CM | POA: Diagnosis not present

## 2020-02-10 DIAGNOSIS — D122 Benign neoplasm of ascending colon: Secondary | ICD-10-CM | POA: Diagnosis not present

## 2020-02-10 DIAGNOSIS — Z8673 Personal history of transient ischemic attack (TIA), and cerebral infarction without residual deficits: Secondary | ICD-10-CM | POA: Diagnosis not present

## 2020-02-10 DIAGNOSIS — R400 Somnolence: Secondary | ICD-10-CM | POA: Diagnosis not present

## 2020-02-10 DIAGNOSIS — C44329 Squamous cell carcinoma of skin of other parts of face: Secondary | ICD-10-CM | POA: Diagnosis not present

## 2020-02-10 DIAGNOSIS — A4150 Gram-negative sepsis, unspecified: Secondary | ICD-10-CM | POA: Diagnosis not present

## 2020-02-10 DIAGNOSIS — K228 Other specified diseases of esophagus: Secondary | ICD-10-CM | POA: Diagnosis not present

## 2020-02-10 DIAGNOSIS — T8452XA Infection and inflammatory reaction due to internal left hip prosthesis, initial encounter: Secondary | ICD-10-CM | POA: Diagnosis not present

## 2020-02-10 DIAGNOSIS — Z9012 Acquired absence of left breast and nipple: Secondary | ICD-10-CM | POA: Diagnosis not present

## 2020-02-10 DIAGNOSIS — I779 Disorder of arteries and arterioles, unspecified: Secondary | ICD-10-CM | POA: Diagnosis not present

## 2020-02-10 DIAGNOSIS — D2239 Melanocytic nevi of other parts of face: Secondary | ICD-10-CM | POA: Diagnosis not present

## 2020-02-10 DIAGNOSIS — K621 Rectal polyp: Secondary | ICD-10-CM | POA: Diagnosis not present

## 2020-02-10 DIAGNOSIS — M25611 Stiffness of right shoulder, not elsewhere classified: Secondary | ICD-10-CM | POA: Diagnosis not present

## 2020-02-10 DIAGNOSIS — H401131 Primary open-angle glaucoma, bilateral, mild stage: Secondary | ICD-10-CM | POA: Diagnosis not present

## 2020-02-10 DIAGNOSIS — H9203 Otalgia, bilateral: Secondary | ICD-10-CM | POA: Diagnosis not present

## 2020-02-10 DIAGNOSIS — N261 Atrophy of kidney (terminal): Secondary | ICD-10-CM | POA: Diagnosis not present

## 2020-02-10 DIAGNOSIS — S82142F Displaced bicondylar fracture of left tibia, subsequent encounter for open fracture type IIIA, IIIB, or IIIC with routine healing: Secondary | ICD-10-CM | POA: Diagnosis not present

## 2020-02-10 DIAGNOSIS — C679 Malignant neoplasm of bladder, unspecified: Secondary | ICD-10-CM | POA: Diagnosis not present

## 2020-02-10 DIAGNOSIS — G9340 Encephalopathy, unspecified: Secondary | ICD-10-CM | POA: Diagnosis not present

## 2020-02-10 DIAGNOSIS — J90 Pleural effusion, not elsewhere classified: Secondary | ICD-10-CM | POA: Diagnosis not present

## 2020-02-10 DIAGNOSIS — M4326 Fusion of spine, lumbar region: Secondary | ICD-10-CM | POA: Diagnosis not present

## 2020-02-10 DIAGNOSIS — I83018 Varicose veins of right lower extremity with ulcer other part of lower leg: Secondary | ICD-10-CM | POA: Diagnosis not present

## 2020-02-10 DIAGNOSIS — F321 Major depressive disorder, single episode, moderate: Secondary | ICD-10-CM | POA: Diagnosis not present

## 2020-02-10 DIAGNOSIS — M4646 Discitis, unspecified, lumbar region: Secondary | ICD-10-CM | POA: Diagnosis not present

## 2020-02-10 DIAGNOSIS — Z1611 Resistance to penicillins: Secondary | ICD-10-CM | POA: Diagnosis not present

## 2020-02-10 DIAGNOSIS — M9902 Segmental and somatic dysfunction of thoracic region: Secondary | ICD-10-CM | POA: Diagnosis not present

## 2020-02-10 DIAGNOSIS — W19XXXA Unspecified fall, initial encounter: Secondary | ICD-10-CM | POA: Diagnosis not present

## 2020-02-10 DIAGNOSIS — H6123 Impacted cerumen, bilateral: Secondary | ICD-10-CM | POA: Diagnosis not present

## 2020-02-10 DIAGNOSIS — Z20822 Contact with and (suspected) exposure to covid-19: Secondary | ICD-10-CM | POA: Diagnosis not present

## 2020-02-10 DIAGNOSIS — I700 Atherosclerosis of aorta: Secondary | ICD-10-CM | POA: Diagnosis not present

## 2020-02-10 DIAGNOSIS — D649 Anemia, unspecified: Secondary | ICD-10-CM | POA: Diagnosis not present

## 2020-02-10 DIAGNOSIS — M0579 Rheumatoid arthritis with rheumatoid factor of multiple sites without organ or systems involvement: Secondary | ICD-10-CM | POA: Diagnosis not present

## 2020-02-10 DIAGNOSIS — F17210 Nicotine dependence, cigarettes, uncomplicated: Secondary | ICD-10-CM | POA: Diagnosis not present

## 2020-02-10 DIAGNOSIS — G3184 Mild cognitive impairment, so stated: Secondary | ICD-10-CM | POA: Diagnosis not present

## 2020-02-10 DIAGNOSIS — J45909 Unspecified asthma, uncomplicated: Secondary | ICD-10-CM | POA: Diagnosis not present

## 2020-02-10 DIAGNOSIS — M6281 Muscle weakness (generalized): Secondary | ICD-10-CM | POA: Diagnosis not present

## 2020-02-10 DIAGNOSIS — M1712 Unilateral primary osteoarthritis, left knee: Secondary | ICD-10-CM | POA: Diagnosis not present

## 2020-02-10 DIAGNOSIS — H25812 Combined forms of age-related cataract, left eye: Secondary | ICD-10-CM | POA: Diagnosis not present

## 2020-02-10 DIAGNOSIS — M19041 Primary osteoarthritis, right hand: Secondary | ICD-10-CM | POA: Diagnosis not present

## 2020-02-10 DIAGNOSIS — R2681 Unsteadiness on feet: Secondary | ICD-10-CM | POA: Diagnosis not present

## 2020-02-10 DIAGNOSIS — G252 Other specified forms of tremor: Secondary | ICD-10-CM | POA: Diagnosis not present

## 2020-02-10 DIAGNOSIS — H903 Sensorineural hearing loss, bilateral: Secondary | ICD-10-CM | POA: Diagnosis not present

## 2020-02-10 DIAGNOSIS — R296 Repeated falls: Secondary | ICD-10-CM | POA: Diagnosis not present

## 2020-02-10 DIAGNOSIS — Z602 Problems related to living alone: Secondary | ICD-10-CM | POA: Diagnosis not present

## 2020-02-10 DIAGNOSIS — J40 Bronchitis, not specified as acute or chronic: Secondary | ICD-10-CM | POA: Diagnosis not present

## 2020-02-10 DIAGNOSIS — M62551 Muscle wasting and atrophy, not elsewhere classified, right thigh: Secondary | ICD-10-CM | POA: Diagnosis not present

## 2020-02-10 DIAGNOSIS — Z8672 Personal history of thrombophlebitis: Secondary | ICD-10-CM | POA: Diagnosis not present

## 2020-02-10 DIAGNOSIS — Z833 Family history of diabetes mellitus: Secondary | ICD-10-CM | POA: Diagnosis not present

## 2020-02-10 DIAGNOSIS — J309 Allergic rhinitis, unspecified: Secondary | ICD-10-CM | POA: Diagnosis not present

## 2020-02-10 DIAGNOSIS — Z471 Aftercare following joint replacement surgery: Secondary | ICD-10-CM | POA: Diagnosis not present

## 2020-02-10 DIAGNOSIS — I69320 Aphasia following cerebral infarction: Secondary | ICD-10-CM | POA: Diagnosis not present

## 2020-02-10 DIAGNOSIS — M25512 Pain in left shoulder: Secondary | ICD-10-CM | POA: Diagnosis not present

## 2020-02-10 DIAGNOSIS — D473 Essential (hemorrhagic) thrombocythemia: Secondary | ICD-10-CM | POA: Diagnosis not present

## 2020-02-10 DIAGNOSIS — G20 Parkinson's disease: Secondary | ICD-10-CM | POA: Diagnosis not present

## 2020-02-10 DIAGNOSIS — N189 Chronic kidney disease, unspecified: Secondary | ICD-10-CM | POA: Diagnosis not present

## 2020-02-10 DIAGNOSIS — M818 Other osteoporosis without current pathological fracture: Secondary | ICD-10-CM | POA: Diagnosis not present

## 2020-02-10 DIAGNOSIS — G8918 Other acute postprocedural pain: Secondary | ICD-10-CM | POA: Diagnosis not present

## 2020-02-10 DIAGNOSIS — L03116 Cellulitis of left lower limb: Secondary | ICD-10-CM | POA: Diagnosis not present

## 2020-02-10 DIAGNOSIS — I4510 Unspecified right bundle-branch block: Secondary | ICD-10-CM | POA: Diagnosis not present

## 2020-02-10 DIAGNOSIS — Z03818 Encounter for observation for suspected exposure to other biological agents ruled out: Secondary | ICD-10-CM | POA: Diagnosis not present

## 2020-02-10 DIAGNOSIS — C44529 Squamous cell carcinoma of skin of other part of trunk: Secondary | ICD-10-CM | POA: Diagnosis not present

## 2020-02-10 DIAGNOSIS — C3412 Malignant neoplasm of upper lobe, left bronchus or lung: Secondary | ICD-10-CM | POA: Diagnosis not present

## 2020-02-10 DIAGNOSIS — Z90710 Acquired absence of both cervix and uterus: Secondary | ICD-10-CM | POA: Diagnosis not present

## 2020-02-10 DIAGNOSIS — R112 Nausea with vomiting, unspecified: Secondary | ICD-10-CM | POA: Diagnosis not present

## 2020-02-10 DIAGNOSIS — M25612 Stiffness of left shoulder, not elsewhere classified: Secondary | ICD-10-CM | POA: Diagnosis not present

## 2020-02-10 DIAGNOSIS — M25662 Stiffness of left knee, not elsewhere classified: Secondary | ICD-10-CM | POA: Diagnosis not present

## 2020-02-10 DIAGNOSIS — Z841 Family history of disorders of kidney and ureter: Secondary | ICD-10-CM | POA: Diagnosis not present

## 2020-02-10 DIAGNOSIS — D225 Melanocytic nevi of trunk: Secondary | ICD-10-CM | POA: Diagnosis not present

## 2020-02-10 DIAGNOSIS — K635 Polyp of colon: Secondary | ICD-10-CM | POA: Diagnosis not present

## 2020-02-10 DIAGNOSIS — R635 Abnormal weight gain: Secondary | ICD-10-CM | POA: Diagnosis not present

## 2020-02-10 DIAGNOSIS — C50112 Malignant neoplasm of central portion of left female breast: Secondary | ICD-10-CM | POA: Diagnosis not present

## 2020-02-10 DIAGNOSIS — G933 Postviral fatigue syndrome: Secondary | ICD-10-CM | POA: Diagnosis not present

## 2020-02-10 DIAGNOSIS — N393 Stress incontinence (female) (male): Secondary | ICD-10-CM | POA: Diagnosis not present

## 2020-02-10 DIAGNOSIS — G894 Chronic pain syndrome: Secondary | ICD-10-CM | POA: Diagnosis not present

## 2020-02-10 DIAGNOSIS — M76822 Posterior tibial tendinitis, left leg: Secondary | ICD-10-CM | POA: Diagnosis not present

## 2020-02-10 DIAGNOSIS — R600 Localized edema: Secondary | ICD-10-CM | POA: Diagnosis not present

## 2020-02-10 DIAGNOSIS — I480 Paroxysmal atrial fibrillation: Secondary | ICD-10-CM | POA: Diagnosis not present

## 2020-02-10 DIAGNOSIS — I10 Essential (primary) hypertension: Secondary | ICD-10-CM | POA: Diagnosis not present

## 2020-02-10 DIAGNOSIS — G4733 Obstructive sleep apnea (adult) (pediatric): Secondary | ICD-10-CM | POA: Diagnosis not present

## 2020-02-10 DIAGNOSIS — N6011 Diffuse cystic mastopathy of right breast: Secondary | ICD-10-CM | POA: Diagnosis not present

## 2020-02-10 DIAGNOSIS — D631 Anemia in chronic kidney disease: Secondary | ICD-10-CM | POA: Diagnosis not present

## 2020-02-10 DIAGNOSIS — R4189 Other symptoms and signs involving cognitive functions and awareness: Secondary | ICD-10-CM | POA: Diagnosis not present

## 2020-02-10 DIAGNOSIS — T84296A Other mechanical complication of internal fixation device of vertebrae, initial encounter: Secondary | ICD-10-CM | POA: Diagnosis not present

## 2020-02-10 DIAGNOSIS — M6282 Rhabdomyolysis: Secondary | ICD-10-CM | POA: Diagnosis not present

## 2020-02-10 DIAGNOSIS — M5137 Other intervertebral disc degeneration, lumbosacral region: Secondary | ICD-10-CM | POA: Diagnosis not present

## 2020-02-10 DIAGNOSIS — M8580 Other specified disorders of bone density and structure, unspecified site: Secondary | ICD-10-CM | POA: Diagnosis not present

## 2020-02-10 DIAGNOSIS — R54 Age-related physical debility: Secondary | ICD-10-CM | POA: Diagnosis not present

## 2020-02-10 DIAGNOSIS — E860 Dehydration: Secondary | ICD-10-CM | POA: Diagnosis not present

## 2020-02-10 DIAGNOSIS — E278 Other specified disorders of adrenal gland: Secondary | ICD-10-CM | POA: Diagnosis not present

## 2020-02-10 DIAGNOSIS — Z125 Encounter for screening for malignant neoplasm of prostate: Secondary | ICD-10-CM | POA: Diagnosis not present

## 2020-02-10 DIAGNOSIS — H269 Unspecified cataract: Secondary | ICD-10-CM | POA: Diagnosis not present

## 2020-02-10 DIAGNOSIS — D509 Iron deficiency anemia, unspecified: Secondary | ICD-10-CM | POA: Diagnosis not present

## 2020-02-10 DIAGNOSIS — Z8042 Family history of malignant neoplasm of prostate: Secondary | ICD-10-CM | POA: Diagnosis not present

## 2020-02-10 DIAGNOSIS — Z6825 Body mass index (BMI) 25.0-25.9, adult: Secondary | ICD-10-CM | POA: Diagnosis not present

## 2020-02-10 DIAGNOSIS — I6381 Other cerebral infarction due to occlusion or stenosis of small artery: Secondary | ICD-10-CM | POA: Diagnosis not present

## 2020-02-10 DIAGNOSIS — C44629 Squamous cell carcinoma of skin of left upper limb, including shoulder: Secondary | ICD-10-CM | POA: Diagnosis not present

## 2020-02-10 DIAGNOSIS — K219 Gastro-esophageal reflux disease without esophagitis: Secondary | ICD-10-CM | POA: Diagnosis not present

## 2020-02-10 DIAGNOSIS — K529 Noninfective gastroenteritis and colitis, unspecified: Secondary | ICD-10-CM | POA: Diagnosis not present

## 2020-02-10 DIAGNOSIS — E1140 Type 2 diabetes mellitus with diabetic neuropathy, unspecified: Secondary | ICD-10-CM | POA: Diagnosis not present

## 2020-02-10 DIAGNOSIS — F17200 Nicotine dependence, unspecified, uncomplicated: Secondary | ICD-10-CM | POA: Diagnosis not present

## 2020-02-10 DIAGNOSIS — I671 Cerebral aneurysm, nonruptured: Secondary | ICD-10-CM | POA: Diagnosis not present

## 2020-02-10 DIAGNOSIS — C50211 Malignant neoplasm of upper-inner quadrant of right female breast: Secondary | ICD-10-CM | POA: Diagnosis not present

## 2020-02-10 DIAGNOSIS — E291 Testicular hypofunction: Secondary | ICD-10-CM | POA: Diagnosis not present

## 2020-02-10 DIAGNOSIS — R29898 Other symptoms and signs involving the musculoskeletal system: Secondary | ICD-10-CM | POA: Diagnosis not present

## 2020-02-10 DIAGNOSIS — C61 Malignant neoplasm of prostate: Secondary | ICD-10-CM | POA: Diagnosis not present

## 2020-02-10 DIAGNOSIS — Z832 Family history of diseases of the blood and blood-forming organs and certain disorders involving the immune mechanism: Secondary | ICD-10-CM | POA: Diagnosis not present

## 2020-02-10 DIAGNOSIS — D696 Thrombocytopenia, unspecified: Secondary | ICD-10-CM | POA: Diagnosis not present

## 2020-02-10 DIAGNOSIS — C3490 Malignant neoplasm of unspecified part of unspecified bronchus or lung: Secondary | ICD-10-CM | POA: Diagnosis not present

## 2020-02-10 DIAGNOSIS — G8194 Hemiplegia, unspecified affecting left nondominant side: Secondary | ICD-10-CM | POA: Diagnosis not present

## 2020-02-10 DIAGNOSIS — M150 Primary generalized (osteo)arthritis: Secondary | ICD-10-CM | POA: Diagnosis not present

## 2020-02-10 DIAGNOSIS — M24542 Contracture, left hand: Secondary | ICD-10-CM | POA: Diagnosis not present

## 2020-02-10 DIAGNOSIS — F329 Major depressive disorder, single episode, unspecified: Secondary | ICD-10-CM | POA: Diagnosis not present

## 2020-02-10 DIAGNOSIS — Z4431 Encounter for fitting and adjustment of external right breast prosthesis: Secondary | ICD-10-CM | POA: Diagnosis not present

## 2020-02-10 DIAGNOSIS — K8020 Calculus of gallbladder without cholecystitis without obstruction: Secondary | ICD-10-CM | POA: Diagnosis not present

## 2020-02-10 DIAGNOSIS — R0981 Nasal congestion: Secondary | ICD-10-CM | POA: Diagnosis not present

## 2020-02-10 DIAGNOSIS — M5417 Radiculopathy, lumbosacral region: Secondary | ICD-10-CM | POA: Diagnosis not present

## 2020-02-10 DIAGNOSIS — S22000A Wedge compression fracture of unspecified thoracic vertebra, initial encounter for closed fracture: Secondary | ICD-10-CM | POA: Diagnosis not present

## 2020-02-10 DIAGNOSIS — J45998 Other asthma: Secondary | ICD-10-CM | POA: Diagnosis not present

## 2020-02-10 DIAGNOSIS — M545 Low back pain: Secondary | ICD-10-CM | POA: Diagnosis not present

## 2020-02-10 DIAGNOSIS — T466X5A Adverse effect of antihyperlipidemic and antiarteriosclerotic drugs, initial encounter: Secondary | ICD-10-CM | POA: Diagnosis not present

## 2020-02-10 DIAGNOSIS — Z1211 Encounter for screening for malignant neoplasm of colon: Secondary | ICD-10-CM | POA: Diagnosis not present

## 2020-02-10 DIAGNOSIS — K449 Diaphragmatic hernia without obstruction or gangrene: Secondary | ICD-10-CM | POA: Diagnosis not present

## 2020-02-10 DIAGNOSIS — K3189 Other diseases of stomach and duodenum: Secondary | ICD-10-CM | POA: Diagnosis not present

## 2020-02-10 DIAGNOSIS — R413 Other amnesia: Secondary | ICD-10-CM | POA: Diagnosis not present

## 2020-02-10 DIAGNOSIS — R531 Weakness: Secondary | ICD-10-CM | POA: Diagnosis not present

## 2020-02-10 DIAGNOSIS — Z79891 Long term (current) use of opiate analgesic: Secondary | ICD-10-CM | POA: Diagnosis not present

## 2020-02-10 DIAGNOSIS — I25119 Atherosclerotic heart disease of native coronary artery with unspecified angina pectoris: Secondary | ICD-10-CM | POA: Diagnosis not present

## 2020-02-10 DIAGNOSIS — C50911 Malignant neoplasm of unspecified site of right female breast: Secondary | ICD-10-CM | POA: Diagnosis not present

## 2020-02-10 DIAGNOSIS — M542 Cervicalgia: Secondary | ICD-10-CM | POA: Diagnosis not present

## 2020-02-10 DIAGNOSIS — I313 Pericardial effusion (noninflammatory): Secondary | ICD-10-CM | POA: Diagnosis not present

## 2020-02-10 DIAGNOSIS — R358 Other polyuria: Secondary | ICD-10-CM | POA: Diagnosis not present

## 2020-02-10 DIAGNOSIS — Z7984 Long term (current) use of oral hypoglycemic drugs: Secondary | ICD-10-CM | POA: Diagnosis not present

## 2020-02-10 DIAGNOSIS — Z8041 Family history of malignant neoplasm of ovary: Secondary | ICD-10-CM | POA: Diagnosis not present

## 2020-02-10 DIAGNOSIS — R29700 NIHSS score 0: Secondary | ICD-10-CM | POA: Diagnosis not present

## 2020-02-10 DIAGNOSIS — E440 Moderate protein-calorie malnutrition: Secondary | ICD-10-CM | POA: Diagnosis not present

## 2020-02-10 DIAGNOSIS — E1159 Type 2 diabetes mellitus with other circulatory complications: Secondary | ICD-10-CM | POA: Diagnosis not present

## 2020-02-10 DIAGNOSIS — M86171 Other acute osteomyelitis, right ankle and foot: Secondary | ICD-10-CM | POA: Diagnosis not present

## 2020-02-10 DIAGNOSIS — Z79810 Long term (current) use of selective estrogen receptor modulators (SERMs): Secondary | ICD-10-CM | POA: Diagnosis not present

## 2020-02-10 DIAGNOSIS — F419 Anxiety disorder, unspecified: Secondary | ICD-10-CM | POA: Diagnosis not present

## 2020-02-10 DIAGNOSIS — L820 Inflamed seborrheic keratosis: Secondary | ICD-10-CM | POA: Diagnosis not present

## 2020-02-10 DIAGNOSIS — K5909 Other constipation: Secondary | ICD-10-CM | POA: Diagnosis not present

## 2020-02-10 DIAGNOSIS — Z01812 Encounter for preprocedural laboratory examination: Secondary | ICD-10-CM | POA: Diagnosis not present

## 2020-02-10 DIAGNOSIS — T8189XA Other complications of procedures, not elsewhere classified, initial encounter: Secondary | ICD-10-CM | POA: Diagnosis not present

## 2020-02-10 DIAGNOSIS — Z7952 Long term (current) use of systemic steroids: Secondary | ICD-10-CM | POA: Diagnosis not present

## 2020-02-10 DIAGNOSIS — C50312 Malignant neoplasm of lower-inner quadrant of left female breast: Secondary | ICD-10-CM | POA: Diagnosis not present

## 2020-02-10 DIAGNOSIS — Z7982 Long term (current) use of aspirin: Secondary | ICD-10-CM | POA: Diagnosis not present

## 2020-02-10 DIAGNOSIS — F4310 Post-traumatic stress disorder, unspecified: Secondary | ICD-10-CM | POA: Diagnosis not present

## 2020-02-10 DIAGNOSIS — J301 Allergic rhinitis due to pollen: Secondary | ICD-10-CM | POA: Diagnosis not present

## 2020-02-10 DIAGNOSIS — S42202D Unspecified fracture of upper end of left humerus, subsequent encounter for fracture with routine healing: Secondary | ICD-10-CM | POA: Diagnosis not present

## 2020-02-10 DIAGNOSIS — I272 Pulmonary hypertension, unspecified: Secondary | ICD-10-CM | POA: Diagnosis not present

## 2020-02-10 DIAGNOSIS — S82261F Displaced segmental fracture of shaft of right tibia, subsequent encounter for open fracture type IIIA, IIIB, or IIIC with routine healing: Secondary | ICD-10-CM | POA: Diagnosis not present

## 2020-02-10 DIAGNOSIS — R270 Ataxia, unspecified: Secondary | ICD-10-CM | POA: Diagnosis not present

## 2020-02-10 DIAGNOSIS — M79644 Pain in right finger(s): Secondary | ICD-10-CM | POA: Diagnosis not present

## 2020-02-10 DIAGNOSIS — G609 Hereditary and idiopathic neuropathy, unspecified: Secondary | ICD-10-CM | POA: Diagnosis not present

## 2020-02-10 DIAGNOSIS — Z955 Presence of coronary angioplasty implant and graft: Secondary | ICD-10-CM | POA: Diagnosis not present

## 2020-02-10 DIAGNOSIS — D0471 Carcinoma in situ of skin of right lower limb, including hip: Secondary | ICD-10-CM | POA: Diagnosis not present

## 2020-02-10 DIAGNOSIS — T83511A Infection and inflammatory reaction due to indwelling urethral catheter, initial encounter: Secondary | ICD-10-CM | POA: Diagnosis not present

## 2020-02-10 DIAGNOSIS — Z803 Family history of malignant neoplasm of breast: Secondary | ICD-10-CM | POA: Diagnosis not present

## 2020-02-10 DIAGNOSIS — L089 Local infection of the skin and subcutaneous tissue, unspecified: Secondary | ICD-10-CM | POA: Diagnosis not present

## 2020-02-10 DIAGNOSIS — M170 Bilateral primary osteoarthritis of knee: Secondary | ICD-10-CM | POA: Diagnosis not present

## 2020-02-10 DIAGNOSIS — J0110 Acute frontal sinusitis, unspecified: Secondary | ICD-10-CM | POA: Diagnosis not present

## 2020-02-10 DIAGNOSIS — R17 Unspecified jaundice: Secondary | ICD-10-CM | POA: Diagnosis not present

## 2020-02-10 DIAGNOSIS — E559 Vitamin D deficiency, unspecified: Secondary | ICD-10-CM | POA: Diagnosis not present

## 2020-02-10 DIAGNOSIS — E890 Postprocedural hypothyroidism: Secondary | ICD-10-CM | POA: Diagnosis not present

## 2020-02-10 DIAGNOSIS — R519 Headache, unspecified: Secondary | ICD-10-CM | POA: Diagnosis not present

## 2020-02-10 DIAGNOSIS — J209 Acute bronchitis, unspecified: Secondary | ICD-10-CM | POA: Diagnosis not present

## 2020-02-10 DIAGNOSIS — E871 Hypo-osmolality and hyponatremia: Secondary | ICD-10-CM | POA: Diagnosis not present

## 2020-02-10 DIAGNOSIS — B961 Klebsiella pneumoniae [K. pneumoniae] as the cause of diseases classified elsewhere: Secondary | ICD-10-CM | POA: Diagnosis not present

## 2020-02-10 DIAGNOSIS — D485 Neoplasm of uncertain behavior of skin: Secondary | ICD-10-CM | POA: Diagnosis not present

## 2020-02-10 DIAGNOSIS — R5383 Other fatigue: Secondary | ICD-10-CM | POA: Diagnosis not present

## 2020-02-10 DIAGNOSIS — H35033 Hypertensive retinopathy, bilateral: Secondary | ICD-10-CM | POA: Diagnosis not present

## 2020-02-10 DIAGNOSIS — I509 Heart failure, unspecified: Secondary | ICD-10-CM | POA: Diagnosis not present

## 2020-02-10 DIAGNOSIS — F4329 Adjustment disorder with other symptoms: Secondary | ICD-10-CM | POA: Diagnosis not present

## 2020-02-10 DIAGNOSIS — S20212A Contusion of left front wall of thorax, initial encounter: Secondary | ICD-10-CM | POA: Diagnosis not present

## 2020-02-10 DIAGNOSIS — C50912 Malignant neoplasm of unspecified site of left female breast: Secondary | ICD-10-CM | POA: Diagnosis not present

## 2020-02-10 DIAGNOSIS — J302 Other seasonal allergic rhinitis: Secondary | ICD-10-CM | POA: Diagnosis not present

## 2020-02-10 DIAGNOSIS — Z923 Personal history of irradiation: Secondary | ICD-10-CM | POA: Diagnosis not present

## 2020-02-10 DIAGNOSIS — T8463XA Infection and inflammatory reaction due to internal fixation device of spine, initial encounter: Secondary | ICD-10-CM | POA: Diagnosis not present

## 2020-02-10 DIAGNOSIS — E1349 Other specified diabetes mellitus with other diabetic neurological complication: Secondary | ICD-10-CM | POA: Diagnosis not present

## 2020-02-10 DIAGNOSIS — M25551 Pain in right hip: Secondary | ICD-10-CM | POA: Diagnosis not present

## 2020-02-10 DIAGNOSIS — Z1339 Encounter for screening examination for other mental health and behavioral disorders: Secondary | ICD-10-CM | POA: Diagnosis not present

## 2020-02-10 DIAGNOSIS — E663 Overweight: Secondary | ICD-10-CM | POA: Diagnosis not present

## 2020-02-10 DIAGNOSIS — R52 Pain, unspecified: Secondary | ICD-10-CM | POA: Diagnosis not present

## 2020-02-10 DIAGNOSIS — J449 Chronic obstructive pulmonary disease, unspecified: Secondary | ICD-10-CM | POA: Diagnosis not present

## 2020-02-10 DIAGNOSIS — H9190 Unspecified hearing loss, unspecified ear: Secondary | ICD-10-CM | POA: Diagnosis not present

## 2020-02-10 DIAGNOSIS — I5032 Chronic diastolic (congestive) heart failure: Secondary | ICD-10-CM | POA: Diagnosis not present

## 2020-02-10 DIAGNOSIS — I4892 Unspecified atrial flutter: Secondary | ICD-10-CM | POA: Diagnosis not present

## 2020-02-10 DIAGNOSIS — R29702 NIHSS score 2: Secondary | ICD-10-CM | POA: Diagnosis not present

## 2020-02-10 DIAGNOSIS — I951 Orthostatic hypotension: Secondary | ICD-10-CM | POA: Diagnosis not present

## 2020-02-10 DIAGNOSIS — N319 Neuromuscular dysfunction of bladder, unspecified: Secondary | ICD-10-CM | POA: Diagnosis not present

## 2020-02-10 DIAGNOSIS — M62830 Muscle spasm of back: Secondary | ICD-10-CM | POA: Diagnosis not present

## 2020-02-10 DIAGNOSIS — R279 Unspecified lack of coordination: Secondary | ICD-10-CM | POA: Diagnosis not present

## 2020-02-10 DIAGNOSIS — R29703 NIHSS score 3: Secondary | ICD-10-CM | POA: Diagnosis not present

## 2020-02-10 DIAGNOSIS — J4530 Mild persistent asthma, uncomplicated: Secondary | ICD-10-CM | POA: Diagnosis not present

## 2020-02-10 DIAGNOSIS — L89153 Pressure ulcer of sacral region, stage 3: Secondary | ICD-10-CM | POA: Diagnosis not present

## 2020-02-10 DIAGNOSIS — R29704 NIHSS score 4: Secondary | ICD-10-CM | POA: Diagnosis not present

## 2020-02-10 DIAGNOSIS — M5442 Lumbago with sciatica, left side: Secondary | ICD-10-CM | POA: Diagnosis not present

## 2020-02-10 DIAGNOSIS — R269 Unspecified abnormalities of gait and mobility: Secondary | ICD-10-CM | POA: Diagnosis not present

## 2020-02-10 DIAGNOSIS — I129 Hypertensive chronic kidney disease with stage 1 through stage 4 chronic kidney disease, or unspecified chronic kidney disease: Secondary | ICD-10-CM | POA: Diagnosis not present

## 2020-02-10 DIAGNOSIS — E538 Deficiency of other specified B group vitamins: Secondary | ICD-10-CM | POA: Diagnosis not present

## 2020-02-10 DIAGNOSIS — Z8249 Family history of ischemic heart disease and other diseases of the circulatory system: Secondary | ICD-10-CM | POA: Diagnosis not present

## 2020-02-10 DIAGNOSIS — Z9114 Patient's other noncompliance with medication regimen: Secondary | ICD-10-CM | POA: Diagnosis not present

## 2020-02-10 DIAGNOSIS — K5730 Diverticulosis of large intestine without perforation or abscess without bleeding: Secondary | ICD-10-CM | POA: Diagnosis not present

## 2020-02-10 DIAGNOSIS — M25552 Pain in left hip: Secondary | ICD-10-CM | POA: Diagnosis not present

## 2020-02-10 DIAGNOSIS — E1149 Type 2 diabetes mellitus with other diabetic neurological complication: Secondary | ICD-10-CM | POA: Diagnosis not present

## 2020-02-10 DIAGNOSIS — T84216A Breakdown (mechanical) of internal fixation device of vertebrae, initial encounter: Secondary | ICD-10-CM | POA: Diagnosis not present

## 2020-02-10 DIAGNOSIS — Z452 Encounter for adjustment and management of vascular access device: Secondary | ICD-10-CM | POA: Diagnosis not present

## 2020-02-10 DIAGNOSIS — G301 Alzheimer's disease with late onset: Secondary | ICD-10-CM | POA: Diagnosis not present

## 2020-02-10 DIAGNOSIS — M5126 Other intervertebral disc displacement, lumbar region: Secondary | ICD-10-CM | POA: Diagnosis not present

## 2020-02-10 DIAGNOSIS — Z4781 Encounter for orthopedic aftercare following surgical amputation: Secondary | ICD-10-CM | POA: Diagnosis not present

## 2020-02-10 DIAGNOSIS — E669 Obesity, unspecified: Secondary | ICD-10-CM | POA: Diagnosis not present

## 2020-02-10 DIAGNOSIS — R748 Abnormal levels of other serum enzymes: Secondary | ICD-10-CM | POA: Diagnosis not present

## 2020-02-10 DIAGNOSIS — H66003 Acute suppurative otitis media without spontaneous rupture of ear drum, bilateral: Secondary | ICD-10-CM | POA: Diagnosis not present

## 2020-02-10 DIAGNOSIS — J4520 Mild intermittent asthma, uncomplicated: Secondary | ICD-10-CM | POA: Diagnosis not present

## 2020-02-10 DIAGNOSIS — F3340 Major depressive disorder, recurrent, in remission, unspecified: Secondary | ICD-10-CM | POA: Diagnosis not present

## 2020-02-10 DIAGNOSIS — M5441 Lumbago with sciatica, right side: Secondary | ICD-10-CM | POA: Diagnosis not present

## 2020-02-10 DIAGNOSIS — M869 Osteomyelitis, unspecified: Secondary | ICD-10-CM | POA: Diagnosis not present

## 2020-02-10 DIAGNOSIS — J3081 Allergic rhinitis due to animal (cat) (dog) hair and dander: Secondary | ICD-10-CM | POA: Diagnosis not present

## 2020-02-10 DIAGNOSIS — R0789 Other chest pain: Secondary | ICD-10-CM | POA: Diagnosis not present

## 2020-02-10 DIAGNOSIS — H4051X2 Glaucoma secondary to other eye disorders, right eye, moderate stage: Secondary | ICD-10-CM | POA: Diagnosis not present

## 2020-02-10 DIAGNOSIS — Z794 Long term (current) use of insulin: Secondary | ICD-10-CM | POA: Diagnosis not present

## 2020-02-10 DIAGNOSIS — R4182 Altered mental status, unspecified: Secondary | ICD-10-CM | POA: Diagnosis not present

## 2020-02-10 DIAGNOSIS — F1020 Alcohol dependence, uncomplicated: Secondary | ICD-10-CM | POA: Diagnosis not present

## 2020-02-10 DIAGNOSIS — Z7902 Long term (current) use of antithrombotics/antiplatelets: Secondary | ICD-10-CM | POA: Diagnosis not present

## 2020-02-10 DIAGNOSIS — R339 Retention of urine, unspecified: Secondary | ICD-10-CM | POA: Diagnosis not present

## 2020-02-10 DIAGNOSIS — Z13820 Encounter for screening for osteoporosis: Secondary | ICD-10-CM | POA: Diagnosis not present

## 2020-02-10 DIAGNOSIS — N390 Urinary tract infection, site not specified: Secondary | ICD-10-CM | POA: Diagnosis not present

## 2020-02-10 DIAGNOSIS — M4712 Other spondylosis with myelopathy, cervical region: Secondary | ICD-10-CM | POA: Diagnosis not present

## 2020-02-10 DIAGNOSIS — H11431 Conjunctival hyperemia, right eye: Secondary | ICD-10-CM | POA: Diagnosis not present

## 2020-02-10 DIAGNOSIS — J3089 Other allergic rhinitis: Secondary | ICD-10-CM | POA: Diagnosis not present

## 2020-02-10 DIAGNOSIS — M48061 Spinal stenosis, lumbar region without neurogenic claudication: Secondary | ICD-10-CM | POA: Diagnosis not present

## 2020-02-10 DIAGNOSIS — Z880 Allergy status to penicillin: Secondary | ICD-10-CM | POA: Diagnosis not present

## 2020-02-10 DIAGNOSIS — C50411 Malignant neoplasm of upper-outer quadrant of right female breast: Secondary | ICD-10-CM | POA: Diagnosis not present

## 2020-02-10 DIAGNOSIS — J45901 Unspecified asthma with (acute) exacerbation: Secondary | ICD-10-CM | POA: Diagnosis not present

## 2020-02-10 DIAGNOSIS — G629 Polyneuropathy, unspecified: Secondary | ICD-10-CM | POA: Diagnosis not present

## 2020-02-10 DIAGNOSIS — C50311 Malignant neoplasm of lower-inner quadrant of right female breast: Secondary | ICD-10-CM | POA: Diagnosis not present

## 2020-02-10 DIAGNOSIS — Z881 Allergy status to other antibiotic agents status: Secondary | ICD-10-CM | POA: Diagnosis not present

## 2020-02-10 DIAGNOSIS — R1013 Epigastric pain: Secondary | ICD-10-CM | POA: Diagnosis not present

## 2020-02-10 DIAGNOSIS — I69354 Hemiplegia and hemiparesis following cerebral infarction affecting left non-dominant side: Secondary | ICD-10-CM | POA: Diagnosis not present

## 2020-02-10 DIAGNOSIS — N179 Acute kidney failure, unspecified: Secondary | ICD-10-CM | POA: Diagnosis not present

## 2020-02-10 DIAGNOSIS — I130 Hypertensive heart and chronic kidney disease with heart failure and stage 1 through stage 4 chronic kidney disease, or unspecified chronic kidney disease: Secondary | ICD-10-CM | POA: Diagnosis not present

## 2020-02-10 DIAGNOSIS — Z743 Need for continuous supervision: Secondary | ICD-10-CM | POA: Diagnosis not present

## 2020-02-10 DIAGNOSIS — R790 Abnormal level of blood mineral: Secondary | ICD-10-CM | POA: Diagnosis not present

## 2020-02-10 DIAGNOSIS — G459 Transient cerebral ischemic attack, unspecified: Secondary | ICD-10-CM | POA: Diagnosis not present

## 2020-02-10 DIAGNOSIS — M278 Other specified diseases of jaws: Secondary | ICD-10-CM | POA: Diagnosis not present

## 2020-02-10 DIAGNOSIS — E1165 Type 2 diabetes mellitus with hyperglycemia: Secondary | ICD-10-CM | POA: Diagnosis not present

## 2020-02-10 DIAGNOSIS — F411 Generalized anxiety disorder: Secondary | ICD-10-CM | POA: Diagnosis not present

## 2020-02-10 DIAGNOSIS — I110 Hypertensive heart disease with heart failure: Secondary | ICD-10-CM | POA: Diagnosis not present

## 2020-02-10 DIAGNOSIS — Z91018 Allergy to other foods: Secondary | ICD-10-CM | POA: Diagnosis not present

## 2020-02-10 DIAGNOSIS — M25572 Pain in left ankle and joints of left foot: Secondary | ICD-10-CM | POA: Diagnosis not present

## 2020-02-10 DIAGNOSIS — M7541 Impingement syndrome of right shoulder: Secondary | ICD-10-CM | POA: Diagnosis not present

## 2020-02-10 DIAGNOSIS — L03119 Cellulitis of unspecified part of limb: Secondary | ICD-10-CM | POA: Diagnosis not present

## 2020-02-10 DIAGNOSIS — Z6835 Body mass index (BMI) 35.0-35.9, adult: Secondary | ICD-10-CM | POA: Diagnosis not present

## 2020-02-10 DIAGNOSIS — R945 Abnormal results of liver function studies: Secondary | ICD-10-CM | POA: Diagnosis not present

## 2020-02-10 DIAGNOSIS — E039 Hypothyroidism, unspecified: Secondary | ICD-10-CM | POA: Diagnosis not present

## 2020-02-10 DIAGNOSIS — I83028 Varicose veins of left lower extremity with ulcer other part of lower leg: Secondary | ICD-10-CM | POA: Diagnosis not present

## 2020-02-10 DIAGNOSIS — S065X1D Traumatic subdural hemorrhage with loss of consciousness of 30 minutes or less, subsequent encounter: Secondary | ICD-10-CM | POA: Diagnosis not present

## 2020-02-10 DIAGNOSIS — C44619 Basal cell carcinoma of skin of left upper limb, including shoulder: Secondary | ICD-10-CM | POA: Diagnosis not present

## 2020-02-10 DIAGNOSIS — F325 Major depressive disorder, single episode, in full remission: Secondary | ICD-10-CM | POA: Diagnosis not present

## 2020-02-10 DIAGNOSIS — D62 Acute posthemorrhagic anemia: Secondary | ICD-10-CM | POA: Diagnosis not present

## 2020-02-10 DIAGNOSIS — R739 Hyperglycemia, unspecified: Secondary | ICD-10-CM | POA: Diagnosis not present

## 2020-02-10 DIAGNOSIS — Z6827 Body mass index (BMI) 27.0-27.9, adult: Secondary | ICD-10-CM | POA: Diagnosis not present

## 2020-02-10 DIAGNOSIS — D751 Secondary polycythemia: Secondary | ICD-10-CM | POA: Diagnosis not present

## 2020-02-10 DIAGNOSIS — T50Z95A Adverse effect of other vaccines and biological substances, initial encounter: Secondary | ICD-10-CM | POA: Diagnosis not present

## 2020-02-10 DIAGNOSIS — S46012D Strain of muscle(s) and tendon(s) of the rotator cuff of left shoulder, subsequent encounter: Secondary | ICD-10-CM | POA: Diagnosis not present

## 2020-02-10 DIAGNOSIS — R030 Elevated blood-pressure reading, without diagnosis of hypertension: Secondary | ICD-10-CM | POA: Diagnosis not present

## 2020-02-10 DIAGNOSIS — R262 Difficulty in walking, not elsewhere classified: Secondary | ICD-10-CM | POA: Diagnosis not present

## 2020-02-10 DIAGNOSIS — M1A09X0 Idiopathic chronic gout, multiple sites, without tophus (tophi): Secondary | ICD-10-CM | POA: Diagnosis not present

## 2020-02-10 DIAGNOSIS — M179 Osteoarthritis of knee, unspecified: Secondary | ICD-10-CM | POA: Diagnosis not present

## 2020-02-10 DIAGNOSIS — H60393 Other infective otitis externa, bilateral: Secondary | ICD-10-CM | POA: Diagnosis not present

## 2020-02-10 DIAGNOSIS — Z853 Personal history of malignant neoplasm of breast: Secondary | ICD-10-CM | POA: Diagnosis not present

## 2020-02-10 DIAGNOSIS — N3090 Cystitis, unspecified without hematuria: Secondary | ICD-10-CM | POA: Diagnosis not present

## 2020-02-10 DIAGNOSIS — E441 Mild protein-calorie malnutrition: Secondary | ICD-10-CM | POA: Diagnosis not present

## 2020-02-10 DIAGNOSIS — C8510 Unspecified B-cell lymphoma, unspecified site: Secondary | ICD-10-CM | POA: Diagnosis not present

## 2020-02-10 DIAGNOSIS — I482 Chronic atrial fibrillation, unspecified: Secondary | ICD-10-CM | POA: Diagnosis not present

## 2020-02-10 DIAGNOSIS — Z6838 Body mass index (BMI) 38.0-38.9, adult: Secondary | ICD-10-CM | POA: Diagnosis not present

## 2020-02-10 DIAGNOSIS — E672 Megavitamin-B6 syndrome: Secondary | ICD-10-CM | POA: Diagnosis not present

## 2020-02-10 DIAGNOSIS — I447 Left bundle-branch block, unspecified: Secondary | ICD-10-CM | POA: Diagnosis not present

## 2020-02-10 DIAGNOSIS — N183 Chronic kidney disease, stage 3 unspecified: Secondary | ICD-10-CM | POA: Diagnosis not present

## 2020-02-10 DIAGNOSIS — M79645 Pain in left finger(s): Secondary | ICD-10-CM | POA: Diagnosis not present

## 2020-02-10 DIAGNOSIS — K5289 Other specified noninfective gastroenteritis and colitis: Secondary | ICD-10-CM | POA: Diagnosis not present

## 2020-02-10 DIAGNOSIS — M955 Acquired deformity of pelvis: Secondary | ICD-10-CM | POA: Diagnosis not present

## 2020-02-10 DIAGNOSIS — K229 Disease of esophagus, unspecified: Secondary | ICD-10-CM | POA: Diagnosis not present

## 2020-02-10 DIAGNOSIS — R7303 Prediabetes: Secondary | ICD-10-CM | POA: Diagnosis not present

## 2020-02-10 DIAGNOSIS — M1990 Unspecified osteoarthritis, unspecified site: Secondary | ICD-10-CM | POA: Diagnosis not present

## 2020-02-10 DIAGNOSIS — R2689 Other abnormalities of gait and mobility: Secondary | ICD-10-CM | POA: Diagnosis not present

## 2020-02-10 DIAGNOSIS — Z510 Encounter for antineoplastic radiation therapy: Secondary | ICD-10-CM | POA: Diagnosis not present

## 2020-02-10 DIAGNOSIS — Z5111 Encounter for antineoplastic chemotherapy: Secondary | ICD-10-CM | POA: Diagnosis not present

## 2020-02-10 DIAGNOSIS — M7542 Impingement syndrome of left shoulder: Secondary | ICD-10-CM | POA: Diagnosis not present

## 2020-02-10 DIAGNOSIS — S335XXA Sprain of ligaments of lumbar spine, initial encounter: Secondary | ICD-10-CM | POA: Diagnosis not present

## 2020-02-10 DIAGNOSIS — H6501 Acute serous otitis media, right ear: Secondary | ICD-10-CM | POA: Diagnosis not present

## 2020-02-10 DIAGNOSIS — R195 Other fecal abnormalities: Secondary | ICD-10-CM | POA: Diagnosis not present

## 2020-02-10 DIAGNOSIS — E1151 Type 2 diabetes mellitus with diabetic peripheral angiopathy without gangrene: Secondary | ICD-10-CM | POA: Diagnosis not present

## 2020-02-10 DIAGNOSIS — E0789 Other specified disorders of thyroid: Secondary | ICD-10-CM | POA: Diagnosis not present

## 2020-02-10 DIAGNOSIS — G7289 Other specified myopathies: Secondary | ICD-10-CM | POA: Diagnosis not present

## 2020-02-10 DIAGNOSIS — G2581 Restless legs syndrome: Secondary | ICD-10-CM | POA: Diagnosis not present

## 2020-02-10 DIAGNOSIS — E519 Thiamine deficiency, unspecified: Secondary | ICD-10-CM | POA: Diagnosis not present

## 2020-02-10 DIAGNOSIS — C3492 Malignant neoplasm of unspecified part of left bronchus or lung: Secondary | ICD-10-CM | POA: Diagnosis not present

## 2020-02-10 DIAGNOSIS — L570 Actinic keratosis: Secondary | ICD-10-CM | POA: Diagnosis not present

## 2020-02-10 DIAGNOSIS — M109 Gout, unspecified: Secondary | ICD-10-CM | POA: Diagnosis not present

## 2020-02-10 DIAGNOSIS — R410 Disorientation, unspecified: Secondary | ICD-10-CM | POA: Diagnosis not present

## 2020-02-10 DIAGNOSIS — E782 Mixed hyperlipidemia: Secondary | ICD-10-CM | POA: Diagnosis not present

## 2020-02-10 DIAGNOSIS — N401 Enlarged prostate with lower urinary tract symptoms: Secondary | ICD-10-CM | POA: Diagnosis not present

## 2020-02-10 DIAGNOSIS — G9341 Metabolic encephalopathy: Secondary | ICD-10-CM | POA: Diagnosis not present

## 2020-02-10 DIAGNOSIS — R0602 Shortness of breath: Secondary | ICD-10-CM | POA: Diagnosis not present

## 2020-02-10 DIAGNOSIS — E2839 Other primary ovarian failure: Secondary | ICD-10-CM | POA: Diagnosis not present

## 2020-02-10 DIAGNOSIS — E1340 Other specified diabetes mellitus with diabetic neuropathy, unspecified: Secondary | ICD-10-CM | POA: Diagnosis not present

## 2020-02-10 DIAGNOSIS — N184 Chronic kidney disease, stage 4 (severe): Secondary | ICD-10-CM | POA: Diagnosis not present

## 2020-02-10 DIAGNOSIS — R001 Bradycardia, unspecified: Secondary | ICD-10-CM | POA: Diagnosis not present

## 2020-02-10 DIAGNOSIS — Z888 Allergy status to other drugs, medicaments and biological substances status: Secondary | ICD-10-CM | POA: Diagnosis not present

## 2020-02-10 DIAGNOSIS — S72401G Unspecified fracture of lower end of right femur, subsequent encounter for closed fracture with delayed healing: Secondary | ICD-10-CM | POA: Diagnosis not present

## 2020-02-10 DIAGNOSIS — R293 Abnormal posture: Secondary | ICD-10-CM | POA: Diagnosis not present

## 2020-02-10 DIAGNOSIS — K7581 Nonalcoholic steatohepatitis (NASH): Secondary | ICD-10-CM | POA: Diagnosis not present

## 2020-02-10 DIAGNOSIS — D72819 Decreased white blood cell count, unspecified: Secondary | ICD-10-CM | POA: Diagnosis not present

## 2020-02-10 DIAGNOSIS — M25562 Pain in left knee: Secondary | ICD-10-CM | POA: Diagnosis not present

## 2020-02-10 DIAGNOSIS — F17219 Nicotine dependence, cigarettes, with unspecified nicotine-induced disorders: Secondary | ICD-10-CM | POA: Diagnosis not present

## 2020-02-10 DIAGNOSIS — M75112 Incomplete rotator cuff tear or rupture of left shoulder, not specified as traumatic: Secondary | ICD-10-CM | POA: Diagnosis not present

## 2020-02-10 DIAGNOSIS — R41841 Cognitive communication deficit: Secondary | ICD-10-CM | POA: Diagnosis not present

## 2020-02-10 DIAGNOSIS — K2270 Barrett's esophagus without dysplasia: Secondary | ICD-10-CM | POA: Diagnosis not present

## 2020-02-10 DIAGNOSIS — Z9225 Personal history of immunosupression therapy: Secondary | ICD-10-CM | POA: Diagnosis not present

## 2020-02-10 DIAGNOSIS — G35 Multiple sclerosis: Secondary | ICD-10-CM | POA: Diagnosis not present

## 2020-02-10 DIAGNOSIS — K7460 Unspecified cirrhosis of liver: Secondary | ICD-10-CM | POA: Diagnosis not present

## 2020-02-10 DIAGNOSIS — E785 Hyperlipidemia, unspecified: Secondary | ICD-10-CM | POA: Diagnosis not present

## 2020-02-10 DIAGNOSIS — R42 Dizziness and giddiness: Secondary | ICD-10-CM | POA: Diagnosis not present

## 2020-02-10 DIAGNOSIS — E119 Type 2 diabetes mellitus without complications: Secondary | ICD-10-CM | POA: Diagnosis not present

## 2020-02-10 DIAGNOSIS — Z741 Need for assistance with personal care: Secondary | ICD-10-CM | POA: Diagnosis not present

## 2020-02-10 DIAGNOSIS — M169 Osteoarthritis of hip, unspecified: Secondary | ICD-10-CM | POA: Diagnosis not present

## 2020-02-10 DIAGNOSIS — Z4789 Encounter for other orthopedic aftercare: Secondary | ICD-10-CM | POA: Diagnosis not present

## 2020-02-10 DIAGNOSIS — N6012 Diffuse cystic mastopathy of left breast: Secondary | ICD-10-CM | POA: Diagnosis not present

## 2020-02-10 DIAGNOSIS — H53001 Unspecified amblyopia, right eye: Secondary | ICD-10-CM | POA: Diagnosis not present

## 2020-02-10 DIAGNOSIS — R918 Other nonspecific abnormal finding of lung field: Secondary | ICD-10-CM | POA: Diagnosis not present

## 2020-02-10 DIAGNOSIS — I4891 Unspecified atrial fibrillation: Secondary | ICD-10-CM | POA: Diagnosis not present

## 2020-02-10 DIAGNOSIS — K222 Esophageal obstruction: Secondary | ICD-10-CM | POA: Diagnosis not present

## 2020-02-10 DIAGNOSIS — Z87891 Personal history of nicotine dependence: Secondary | ICD-10-CM | POA: Diagnosis not present

## 2020-02-10 DIAGNOSIS — R911 Solitary pulmonary nodule: Secondary | ICD-10-CM | POA: Diagnosis not present

## 2020-02-10 DIAGNOSIS — Z9089 Acquired absence of other organs: Secondary | ICD-10-CM | POA: Diagnosis not present

## 2020-02-10 DIAGNOSIS — M9903 Segmental and somatic dysfunction of lumbar region: Secondary | ICD-10-CM | POA: Diagnosis not present

## 2020-02-10 DIAGNOSIS — I469 Cardiac arrest, cause unspecified: Secondary | ICD-10-CM | POA: Diagnosis not present

## 2020-02-10 DIAGNOSIS — K5790 Diverticulosis of intestine, part unspecified, without perforation or abscess without bleeding: Secondary | ICD-10-CM | POA: Diagnosis not present

## 2020-02-10 DIAGNOSIS — K810 Acute cholecystitis: Secondary | ICD-10-CM | POA: Diagnosis not present

## 2020-02-10 DIAGNOSIS — M531 Cervicobrachial syndrome: Secondary | ICD-10-CM | POA: Diagnosis not present

## 2020-02-10 DIAGNOSIS — Z800 Family history of malignant neoplasm of digestive organs: Secondary | ICD-10-CM | POA: Diagnosis not present

## 2020-02-10 DIAGNOSIS — Z01810 Encounter for preprocedural cardiovascular examination: Secondary | ICD-10-CM | POA: Diagnosis not present

## 2020-02-10 DIAGNOSIS — E7800 Pure hypercholesterolemia, unspecified: Secondary | ICD-10-CM | POA: Diagnosis not present

## 2020-02-10 DIAGNOSIS — N1832 Chronic kidney disease, stage 3b: Secondary | ICD-10-CM | POA: Diagnosis not present

## 2020-02-10 DIAGNOSIS — Z9989 Dependence on other enabling machines and devices: Secondary | ICD-10-CM | POA: Diagnosis not present

## 2020-02-10 DIAGNOSIS — Z96651 Presence of right artificial knee joint: Secondary | ICD-10-CM | POA: Diagnosis not present

## 2020-02-10 DIAGNOSIS — F320 Major depressive disorder, single episode, mild: Secondary | ICD-10-CM | POA: Diagnosis not present

## 2020-02-10 DIAGNOSIS — E1142 Type 2 diabetes mellitus with diabetic polyneuropathy: Secondary | ICD-10-CM | POA: Diagnosis not present

## 2020-02-10 DIAGNOSIS — M546 Pain in thoracic spine: Secondary | ICD-10-CM | POA: Diagnosis not present

## 2020-02-10 DIAGNOSIS — B351 Tinea unguium: Secondary | ICD-10-CM | POA: Diagnosis not present

## 2020-02-10 DIAGNOSIS — F5109 Other insomnia not due to a substance or known physiological condition: Secondary | ICD-10-CM | POA: Diagnosis not present

## 2020-02-10 DIAGNOSIS — H35371 Puckering of macula, right eye: Secondary | ICD-10-CM | POA: Diagnosis not present

## 2020-02-10 DIAGNOSIS — L821 Other seborrheic keratosis: Secondary | ICD-10-CM | POA: Diagnosis not present

## 2020-02-10 DIAGNOSIS — Z825 Family history of asthma and other chronic lower respiratory diseases: Secondary | ICD-10-CM | POA: Diagnosis not present

## 2020-02-10 DIAGNOSIS — E7849 Other hyperlipidemia: Secondary | ICD-10-CM | POA: Diagnosis not present

## 2020-02-10 DIAGNOSIS — M791 Myalgia, unspecified site: Secondary | ICD-10-CM | POA: Diagnosis not present

## 2020-02-10 DIAGNOSIS — K649 Unspecified hemorrhoids: Secondary | ICD-10-CM | POA: Diagnosis not present

## 2020-02-10 DIAGNOSIS — E113299 Type 2 diabetes mellitus with mild nonproliferative diabetic retinopathy without macular edema, unspecified eye: Secondary | ICD-10-CM | POA: Diagnosis not present

## 2020-02-10 DIAGNOSIS — Z79899 Other long term (current) drug therapy: Secondary | ICD-10-CM | POA: Diagnosis not present

## 2020-02-10 DIAGNOSIS — M069 Rheumatoid arthritis, unspecified: Secondary | ICD-10-CM | POA: Diagnosis not present

## 2020-02-10 DIAGNOSIS — Z952 Presence of prosthetic heart valve: Secondary | ICD-10-CM | POA: Diagnosis not present

## 2020-02-10 DIAGNOSIS — R278 Other lack of coordination: Secondary | ICD-10-CM | POA: Diagnosis not present

## 2020-02-10 DIAGNOSIS — C642 Malignant neoplasm of left kidney, except renal pelvis: Secondary | ICD-10-CM | POA: Diagnosis not present

## 2020-02-10 DIAGNOSIS — R488 Other symbolic dysfunctions: Secondary | ICD-10-CM | POA: Diagnosis not present

## 2020-02-10 DIAGNOSIS — M5136 Other intervertebral disc degeneration, lumbar region: Secondary | ICD-10-CM | POA: Diagnosis not present

## 2020-02-10 DIAGNOSIS — M25762 Osteophyte, left knee: Secondary | ICD-10-CM | POA: Diagnosis not present

## 2020-02-10 DIAGNOSIS — C7951 Secondary malignant neoplasm of bone: Secondary | ICD-10-CM | POA: Diagnosis not present

## 2020-02-10 DIAGNOSIS — N3001 Acute cystitis with hematuria: Secondary | ICD-10-CM | POA: Diagnosis not present

## 2020-02-10 DIAGNOSIS — Z87442 Personal history of urinary calculi: Secondary | ICD-10-CM | POA: Diagnosis not present

## 2020-02-10 DIAGNOSIS — Z85828 Personal history of other malignant neoplasm of skin: Secondary | ICD-10-CM | POA: Diagnosis not present

## 2020-02-10 DIAGNOSIS — J9621 Acute and chronic respiratory failure with hypoxia: Secondary | ICD-10-CM | POA: Diagnosis not present

## 2020-02-10 DIAGNOSIS — K2970 Gastritis, unspecified, without bleeding: Secondary | ICD-10-CM | POA: Diagnosis not present

## 2020-02-10 DIAGNOSIS — H93A9 Pulsatile tinnitus, unspecified ear: Secondary | ICD-10-CM | POA: Diagnosis not present

## 2020-02-10 DIAGNOSIS — Z885 Allergy status to narcotic agent status: Secondary | ICD-10-CM | POA: Diagnosis not present

## 2020-02-10 DIAGNOSIS — L97822 Non-pressure chronic ulcer of other part of left lower leg with fat layer exposed: Secondary | ICD-10-CM | POA: Diagnosis not present

## 2020-02-10 DIAGNOSIS — Z7951 Long term (current) use of inhaled steroids: Secondary | ICD-10-CM | POA: Diagnosis not present

## 2020-02-10 DIAGNOSIS — I493 Ventricular premature depolarization: Secondary | ICD-10-CM | POA: Diagnosis not present

## 2020-02-10 DIAGNOSIS — R5381 Other malaise: Secondary | ICD-10-CM | POA: Diagnosis not present

## 2020-02-10 DIAGNOSIS — Z89411 Acquired absence of right great toe: Secondary | ICD-10-CM | POA: Diagnosis not present

## 2020-02-10 DIAGNOSIS — M25561 Pain in right knee: Secondary | ICD-10-CM | POA: Diagnosis not present

## 2020-02-10 DIAGNOSIS — G4734 Idiopathic sleep related nonobstructive alveolar hypoventilation: Secondary | ICD-10-CM | POA: Diagnosis not present

## 2020-02-10 DIAGNOSIS — M9901 Segmental and somatic dysfunction of cervical region: Secondary | ICD-10-CM | POA: Diagnosis not present

## 2020-02-10 DIAGNOSIS — M5416 Radiculopathy, lumbar region: Secondary | ICD-10-CM | POA: Diagnosis not present

## 2020-02-10 DIAGNOSIS — F439 Reaction to severe stress, unspecified: Secondary | ICD-10-CM | POA: Diagnosis not present

## 2020-02-10 DIAGNOSIS — U071 COVID-19: Secondary | ICD-10-CM | POA: Diagnosis not present

## 2020-02-10 DIAGNOSIS — I83813 Varicose veins of bilateral lower extremities with pain: Secondary | ICD-10-CM | POA: Diagnosis not present

## 2020-02-10 DIAGNOSIS — Z8546 Personal history of malignant neoplasm of prostate: Secondary | ICD-10-CM | POA: Diagnosis not present

## 2020-02-10 DIAGNOSIS — K589 Irritable bowel syndrome without diarrhea: Secondary | ICD-10-CM | POA: Diagnosis not present

## 2020-02-10 DIAGNOSIS — I8510 Secondary esophageal varices without bleeding: Secondary | ICD-10-CM | POA: Diagnosis not present

## 2020-02-10 DIAGNOSIS — C50212 Malignant neoplasm of upper-inner quadrant of left female breast: Secondary | ICD-10-CM | POA: Diagnosis not present

## 2020-02-10 DIAGNOSIS — B9620 Unspecified Escherichia coli [E. coli] as the cause of diseases classified elsewhere: Secondary | ICD-10-CM | POA: Diagnosis not present

## 2020-02-10 DIAGNOSIS — G214 Vascular parkinsonism: Secondary | ICD-10-CM | POA: Diagnosis not present

## 2020-02-10 DIAGNOSIS — I5022 Chronic systolic (congestive) heart failure: Secondary | ICD-10-CM | POA: Diagnosis not present

## 2020-02-10 DIAGNOSIS — Z981 Arthrodesis status: Secondary | ICD-10-CM | POA: Diagnosis not present

## 2020-02-10 DIAGNOSIS — Z1619 Resistance to other specified beta lactam antibiotics: Secondary | ICD-10-CM | POA: Diagnosis not present

## 2020-02-10 DIAGNOSIS — S5012XA Contusion of left forearm, initial encounter: Secondary | ICD-10-CM | POA: Diagnosis not present

## 2020-02-10 DIAGNOSIS — E789 Disorder of lipoprotein metabolism, unspecified: Secondary | ICD-10-CM | POA: Diagnosis not present

## 2020-02-10 DIAGNOSIS — Z0000 Encounter for general adult medical examination without abnormal findings: Secondary | ICD-10-CM | POA: Diagnosis not present

## 2020-02-10 DIAGNOSIS — R1319 Other dysphagia: Secondary | ICD-10-CM | POA: Diagnosis not present

## 2020-02-10 DIAGNOSIS — E0843 Diabetes mellitus due to underlying condition with diabetic autonomic (poly)neuropathy: Secondary | ICD-10-CM | POA: Diagnosis not present

## 2020-02-10 DIAGNOSIS — Z9049 Acquired absence of other specified parts of digestive tract: Secondary | ICD-10-CM | POA: Diagnosis not present

## 2020-02-10 DIAGNOSIS — I252 Old myocardial infarction: Secondary | ICD-10-CM | POA: Diagnosis not present

## 2020-02-10 DIAGNOSIS — M9904 Segmental and somatic dysfunction of sacral region: Secondary | ICD-10-CM | POA: Diagnosis not present

## 2020-02-10 DIAGNOSIS — Z6831 Body mass index (BMI) 31.0-31.9, adult: Secondary | ICD-10-CM | POA: Diagnosis not present

## 2020-02-10 DIAGNOSIS — M25461 Effusion, right knee: Secondary | ICD-10-CM | POA: Diagnosis not present

## 2020-02-10 DIAGNOSIS — G43009 Migraine without aura, not intractable, without status migrainosus: Secondary | ICD-10-CM | POA: Diagnosis not present

## 2020-02-10 DIAGNOSIS — M549 Dorsalgia, unspecified: Secondary | ICD-10-CM | POA: Diagnosis not present

## 2020-02-10 DIAGNOSIS — Z1331 Encounter for screening for depression: Secondary | ICD-10-CM | POA: Diagnosis not present

## 2020-02-10 DIAGNOSIS — R7989 Other specified abnormal findings of blood chemistry: Secondary | ICD-10-CM | POA: Diagnosis not present

## 2020-02-10 DIAGNOSIS — L97812 Non-pressure chronic ulcer of other part of right lower leg with fat layer exposed: Secondary | ICD-10-CM | POA: Diagnosis not present

## 2020-02-10 DIAGNOSIS — M810 Age-related osteoporosis without current pathological fracture: Secondary | ICD-10-CM | POA: Diagnosis not present

## 2020-02-10 DIAGNOSIS — Z86010 Personal history of colonic polyps: Secondary | ICD-10-CM | POA: Diagnosis not present

## 2020-02-10 DIAGNOSIS — F330 Major depressive disorder, recurrent, mild: Secondary | ICD-10-CM | POA: Diagnosis not present

## 2020-02-10 DIAGNOSIS — R063 Periodic breathing: Secondary | ICD-10-CM | POA: Diagnosis not present

## 2020-02-10 DIAGNOSIS — Z7901 Long term (current) use of anticoagulants: Secondary | ICD-10-CM | POA: Diagnosis not present

## 2020-02-11 DIAGNOSIS — M9903 Segmental and somatic dysfunction of lumbar region: Secondary | ICD-10-CM | POA: Diagnosis not present

## 2020-02-11 DIAGNOSIS — N179 Acute kidney failure, unspecified: Secondary | ICD-10-CM | POA: Diagnosis not present

## 2020-02-11 DIAGNOSIS — Z791 Long term (current) use of non-steroidal anti-inflammatories (NSAID): Secondary | ICD-10-CM | POA: Diagnosis not present

## 2020-02-11 DIAGNOSIS — H1132 Conjunctival hemorrhage, left eye: Secondary | ICD-10-CM | POA: Diagnosis not present

## 2020-02-11 DIAGNOSIS — Z01812 Encounter for preprocedural laboratory examination: Secondary | ICD-10-CM | POA: Diagnosis not present

## 2020-02-11 DIAGNOSIS — J300 Vasomotor rhinitis: Secondary | ICD-10-CM | POA: Diagnosis not present

## 2020-02-11 DIAGNOSIS — I447 Left bundle-branch block, unspecified: Secondary | ICD-10-CM | POA: Diagnosis not present

## 2020-02-11 DIAGNOSIS — I5043 Acute on chronic combined systolic (congestive) and diastolic (congestive) heart failure: Secondary | ICD-10-CM | POA: Diagnosis not present

## 2020-02-11 DIAGNOSIS — B1920 Unspecified viral hepatitis C without hepatic coma: Secondary | ICD-10-CM | POA: Diagnosis not present

## 2020-02-11 DIAGNOSIS — Z79811 Long term (current) use of aromatase inhibitors: Secondary | ICD-10-CM | POA: Diagnosis not present

## 2020-02-11 DIAGNOSIS — J3801 Paralysis of vocal cords and larynx, unilateral: Secondary | ICD-10-CM | POA: Diagnosis not present

## 2020-02-11 DIAGNOSIS — R531 Weakness: Secondary | ICD-10-CM | POA: Diagnosis not present

## 2020-02-11 DIAGNOSIS — S42212D Unspecified displaced fracture of surgical neck of left humerus, subsequent encounter for fracture with routine healing: Secondary | ICD-10-CM | POA: Diagnosis not present

## 2020-02-11 DIAGNOSIS — I10 Essential (primary) hypertension: Secondary | ICD-10-CM | POA: Diagnosis not present

## 2020-02-11 DIAGNOSIS — N202 Calculus of kidney with calculus of ureter: Secondary | ICD-10-CM | POA: Diagnosis not present

## 2020-02-11 DIAGNOSIS — I340 Nonrheumatic mitral (valve) insufficiency: Secondary | ICD-10-CM | POA: Diagnosis not present

## 2020-02-11 DIAGNOSIS — J45909 Unspecified asthma, uncomplicated: Secondary | ICD-10-CM | POA: Diagnosis not present

## 2020-02-11 DIAGNOSIS — R195 Other fecal abnormalities: Secondary | ICD-10-CM | POA: Diagnosis not present

## 2020-02-11 DIAGNOSIS — G118 Other hereditary ataxias: Secondary | ICD-10-CM | POA: Diagnosis not present

## 2020-02-11 DIAGNOSIS — E860 Dehydration: Secondary | ICD-10-CM | POA: Diagnosis not present

## 2020-02-11 DIAGNOSIS — F339 Major depressive disorder, recurrent, unspecified: Secondary | ICD-10-CM | POA: Diagnosis not present

## 2020-02-11 DIAGNOSIS — I255 Ischemic cardiomyopathy: Secondary | ICD-10-CM | POA: Diagnosis not present

## 2020-02-11 DIAGNOSIS — M955 Acquired deformity of pelvis: Secondary | ICD-10-CM | POA: Diagnosis not present

## 2020-02-11 DIAGNOSIS — Z789 Other specified health status: Secondary | ICD-10-CM | POA: Diagnosis not present

## 2020-02-11 DIAGNOSIS — R578 Other shock: Secondary | ICD-10-CM | POA: Diagnosis not present

## 2020-02-11 DIAGNOSIS — I82422 Acute embolism and thrombosis of left iliac vein: Secondary | ICD-10-CM | POA: Diagnosis not present

## 2020-02-11 DIAGNOSIS — Z882 Allergy status to sulfonamides status: Secondary | ICD-10-CM | POA: Diagnosis not present

## 2020-02-11 DIAGNOSIS — L309 Dermatitis, unspecified: Secondary | ICD-10-CM | POA: Diagnosis not present

## 2020-02-11 DIAGNOSIS — Z9981 Dependence on supplemental oxygen: Secondary | ICD-10-CM | POA: Diagnosis not present

## 2020-02-11 DIAGNOSIS — S76311S Strain of muscle, fascia and tendon of the posterior muscle group at thigh level, right thigh, sequela: Secondary | ICD-10-CM | POA: Diagnosis not present

## 2020-02-11 DIAGNOSIS — J9601 Acute respiratory failure with hypoxia: Secondary | ICD-10-CM | POA: Diagnosis not present

## 2020-02-11 DIAGNOSIS — J449 Chronic obstructive pulmonary disease, unspecified: Secondary | ICD-10-CM | POA: Diagnosis not present

## 2020-02-11 DIAGNOSIS — I499 Cardiac arrhythmia, unspecified: Secondary | ICD-10-CM | POA: Diagnosis not present

## 2020-02-11 DIAGNOSIS — I4819 Other persistent atrial fibrillation: Secondary | ICD-10-CM | POA: Diagnosis not present

## 2020-02-11 DIAGNOSIS — I714 Abdominal aortic aneurysm, without rupture: Secondary | ICD-10-CM | POA: Diagnosis not present

## 2020-02-11 DIAGNOSIS — M5126 Other intervertebral disc displacement, lumbar region: Secondary | ICD-10-CM | POA: Diagnosis not present

## 2020-02-11 DIAGNOSIS — Z87891 Personal history of nicotine dependence: Secondary | ICD-10-CM | POA: Diagnosis not present

## 2020-02-11 DIAGNOSIS — M1712 Unilateral primary osteoarthritis, left knee: Secondary | ICD-10-CM | POA: Diagnosis not present

## 2020-02-11 DIAGNOSIS — E785 Hyperlipidemia, unspecified: Secondary | ICD-10-CM | POA: Diagnosis not present

## 2020-02-11 DIAGNOSIS — E162 Hypoglycemia, unspecified: Secondary | ICD-10-CM | POA: Diagnosis not present

## 2020-02-11 DIAGNOSIS — I130 Hypertensive heart and chronic kidney disease with heart failure and stage 1 through stage 4 chronic kidney disease, or unspecified chronic kidney disease: Secondary | ICD-10-CM | POA: Diagnosis not present

## 2020-02-11 DIAGNOSIS — Z952 Presence of prosthetic heart valve: Secondary | ICD-10-CM | POA: Diagnosis not present

## 2020-02-11 DIAGNOSIS — M25662 Stiffness of left knee, not elsewhere classified: Secondary | ICD-10-CM | POA: Diagnosis not present

## 2020-02-11 DIAGNOSIS — M160 Bilateral primary osteoarthritis of hip: Secondary | ICD-10-CM | POA: Diagnosis not present

## 2020-02-11 DIAGNOSIS — L659 Nonscarring hair loss, unspecified: Secondary | ICD-10-CM | POA: Diagnosis not present

## 2020-02-11 DIAGNOSIS — R140 Abdominal distension (gaseous): Secondary | ICD-10-CM | POA: Diagnosis not present

## 2020-02-11 DIAGNOSIS — M961 Postlaminectomy syndrome, not elsewhere classified: Secondary | ICD-10-CM | POA: Diagnosis not present

## 2020-02-11 DIAGNOSIS — C7931 Secondary malignant neoplasm of brain: Secondary | ICD-10-CM | POA: Diagnosis not present

## 2020-02-11 DIAGNOSIS — J470 Bronchiectasis with acute lower respiratory infection: Secondary | ICD-10-CM | POA: Diagnosis not present

## 2020-02-11 DIAGNOSIS — I471 Supraventricular tachycardia: Secondary | ICD-10-CM | POA: Diagnosis not present

## 2020-02-11 DIAGNOSIS — R001 Bradycardia, unspecified: Secondary | ICD-10-CM | POA: Diagnosis not present

## 2020-02-11 DIAGNOSIS — R252 Cramp and spasm: Secondary | ICD-10-CM | POA: Diagnosis not present

## 2020-02-11 DIAGNOSIS — N200 Calculus of kidney: Secondary | ICD-10-CM | POA: Diagnosis not present

## 2020-02-11 DIAGNOSIS — Z7401 Bed confinement status: Secondary | ICD-10-CM | POA: Diagnosis not present

## 2020-02-11 DIAGNOSIS — Z951 Presence of aortocoronary bypass graft: Secondary | ICD-10-CM | POA: Diagnosis not present

## 2020-02-11 DIAGNOSIS — R7301 Impaired fasting glucose: Secondary | ICD-10-CM | POA: Diagnosis not present

## 2020-02-11 DIAGNOSIS — J4541 Moderate persistent asthma with (acute) exacerbation: Secondary | ICD-10-CM | POA: Diagnosis not present

## 2020-02-11 DIAGNOSIS — Z9079 Acquired absence of other genital organ(s): Secondary | ICD-10-CM | POA: Diagnosis not present

## 2020-02-11 DIAGNOSIS — B001 Herpesviral vesicular dermatitis: Secondary | ICD-10-CM | POA: Diagnosis not present

## 2020-02-11 DIAGNOSIS — J320 Chronic maxillary sinusitis: Secondary | ICD-10-CM | POA: Diagnosis not present

## 2020-02-11 DIAGNOSIS — M25531 Pain in right wrist: Secondary | ICD-10-CM | POA: Diagnosis not present

## 2020-02-11 DIAGNOSIS — J80 Acute respiratory distress syndrome: Secondary | ICD-10-CM | POA: Diagnosis not present

## 2020-02-11 DIAGNOSIS — R251 Tremor, unspecified: Secondary | ICD-10-CM | POA: Diagnosis not present

## 2020-02-11 DIAGNOSIS — I252 Old myocardial infarction: Secondary | ICD-10-CM | POA: Diagnosis not present

## 2020-02-11 DIAGNOSIS — M109 Gout, unspecified: Secondary | ICD-10-CM | POA: Diagnosis not present

## 2020-02-11 DIAGNOSIS — G8918 Other acute postprocedural pain: Secondary | ICD-10-CM | POA: Diagnosis not present

## 2020-02-11 DIAGNOSIS — Z833 Family history of diabetes mellitus: Secondary | ICD-10-CM | POA: Diagnosis not present

## 2020-02-11 DIAGNOSIS — D571 Sickle-cell disease without crisis: Secondary | ICD-10-CM | POA: Diagnosis not present

## 2020-02-11 DIAGNOSIS — N62 Hypertrophy of breast: Secondary | ICD-10-CM | POA: Diagnosis not present

## 2020-02-11 DIAGNOSIS — M4727 Other spondylosis with radiculopathy, lumbosacral region: Secondary | ICD-10-CM | POA: Diagnosis not present

## 2020-02-11 DIAGNOSIS — Z8249 Family history of ischemic heart disease and other diseases of the circulatory system: Secondary | ICD-10-CM | POA: Diagnosis not present

## 2020-02-11 DIAGNOSIS — R000 Tachycardia, unspecified: Secondary | ICD-10-CM | POA: Diagnosis not present

## 2020-02-11 DIAGNOSIS — M2550 Pain in unspecified joint: Secondary | ICD-10-CM | POA: Diagnosis not present

## 2020-02-11 DIAGNOSIS — I429 Cardiomyopathy, unspecified: Secondary | ICD-10-CM | POA: Diagnosis not present

## 2020-02-11 DIAGNOSIS — M79652 Pain in left thigh: Secondary | ICD-10-CM | POA: Diagnosis not present

## 2020-02-11 DIAGNOSIS — L570 Actinic keratosis: Secondary | ICD-10-CM | POA: Diagnosis not present

## 2020-02-11 DIAGNOSIS — I483 Typical atrial flutter: Secondary | ICD-10-CM | POA: Diagnosis not present

## 2020-02-11 DIAGNOSIS — R404 Transient alteration of awareness: Secondary | ICD-10-CM | POA: Diagnosis not present

## 2020-02-11 DIAGNOSIS — Z0000 Encounter for general adult medical examination without abnormal findings: Secondary | ICD-10-CM | POA: Diagnosis not present

## 2020-02-11 DIAGNOSIS — M4147 Neuromuscular scoliosis, lumbosacral region: Secondary | ICD-10-CM | POA: Diagnosis not present

## 2020-02-11 DIAGNOSIS — S90852A Superficial foreign body, left foot, initial encounter: Secondary | ICD-10-CM | POA: Diagnosis not present

## 2020-02-11 DIAGNOSIS — R635 Abnormal weight gain: Secondary | ICD-10-CM | POA: Diagnosis not present

## 2020-02-11 DIAGNOSIS — S76011D Strain of muscle, fascia and tendon of right hip, subsequent encounter: Secondary | ICD-10-CM | POA: Diagnosis not present

## 2020-02-11 DIAGNOSIS — C779 Secondary and unspecified malignant neoplasm of lymph node, unspecified: Secondary | ICD-10-CM | POA: Diagnosis not present

## 2020-02-11 DIAGNOSIS — Z20822 Contact with and (suspected) exposure to covid-19: Secondary | ICD-10-CM | POA: Diagnosis not present

## 2020-02-11 DIAGNOSIS — E669 Obesity, unspecified: Secondary | ICD-10-CM | POA: Diagnosis not present

## 2020-02-11 DIAGNOSIS — C61 Malignant neoplasm of prostate: Secondary | ICD-10-CM | POA: Diagnosis not present

## 2020-02-11 DIAGNOSIS — I517 Cardiomegaly: Secondary | ICD-10-CM | POA: Diagnosis not present

## 2020-02-11 DIAGNOSIS — K921 Melena: Secondary | ICD-10-CM | POA: Diagnosis not present

## 2020-02-11 DIAGNOSIS — C7951 Secondary malignant neoplasm of bone: Secondary | ICD-10-CM | POA: Diagnosis not present

## 2020-02-11 DIAGNOSIS — M549 Dorsalgia, unspecified: Secondary | ICD-10-CM | POA: Diagnosis not present

## 2020-02-11 DIAGNOSIS — I4892 Unspecified atrial flutter: Secondary | ICD-10-CM | POA: Diagnosis not present

## 2020-02-11 DIAGNOSIS — J849 Interstitial pulmonary disease, unspecified: Secondary | ICD-10-CM | POA: Diagnosis not present

## 2020-02-11 DIAGNOSIS — M25661 Stiffness of right knee, not elsewhere classified: Secondary | ICD-10-CM | POA: Diagnosis not present

## 2020-02-11 DIAGNOSIS — D62 Acute posthemorrhagic anemia: Secondary | ICD-10-CM | POA: Diagnosis not present

## 2020-02-11 DIAGNOSIS — S41141A Puncture wound with foreign body of right upper arm, initial encounter: Secondary | ICD-10-CM | POA: Diagnosis not present

## 2020-02-11 DIAGNOSIS — E872 Acidosis: Secondary | ICD-10-CM | POA: Diagnosis not present

## 2020-02-11 DIAGNOSIS — Z96641 Presence of right artificial hip joint: Secondary | ICD-10-CM | POA: Diagnosis not present

## 2020-02-11 DIAGNOSIS — Z5181 Encounter for therapeutic drug level monitoring: Secondary | ICD-10-CM | POA: Diagnosis not present

## 2020-02-11 DIAGNOSIS — Z6830 Body mass index (BMI) 30.0-30.9, adult: Secondary | ICD-10-CM | POA: Diagnosis not present

## 2020-02-11 DIAGNOSIS — Z9181 History of falling: Secondary | ICD-10-CM | POA: Diagnosis not present

## 2020-02-11 DIAGNOSIS — E11649 Type 2 diabetes mellitus with hypoglycemia without coma: Secondary | ICD-10-CM | POA: Diagnosis not present

## 2020-02-11 DIAGNOSIS — R410 Disorientation, unspecified: Secondary | ICD-10-CM | POA: Diagnosis not present

## 2020-02-11 DIAGNOSIS — B9620 Unspecified Escherichia coli [E. coli] as the cause of diseases classified elsewhere: Secondary | ICD-10-CM | POA: Diagnosis not present

## 2020-02-11 DIAGNOSIS — R5383 Other fatigue: Secondary | ICD-10-CM | POA: Diagnosis not present

## 2020-02-11 DIAGNOSIS — E11319 Type 2 diabetes mellitus with unspecified diabetic retinopathy without macular edema: Secondary | ICD-10-CM | POA: Diagnosis not present

## 2020-02-11 DIAGNOSIS — Z8619 Personal history of other infectious and parasitic diseases: Secondary | ICD-10-CM | POA: Diagnosis not present

## 2020-02-11 DIAGNOSIS — M5117 Intervertebral disc disorders with radiculopathy, lumbosacral region: Secondary | ICD-10-CM | POA: Diagnosis not present

## 2020-02-11 DIAGNOSIS — I69351 Hemiplegia and hemiparesis following cerebral infarction affecting right dominant side: Secondary | ICD-10-CM | POA: Diagnosis not present

## 2020-02-11 DIAGNOSIS — C7989 Secondary malignant neoplasm of other specified sites: Secondary | ICD-10-CM | POA: Diagnosis not present

## 2020-02-11 DIAGNOSIS — R600 Localized edema: Secondary | ICD-10-CM | POA: Diagnosis not present

## 2020-02-11 DIAGNOSIS — M545 Low back pain: Secondary | ICD-10-CM | POA: Diagnosis not present

## 2020-02-11 DIAGNOSIS — R300 Dysuria: Secondary | ICD-10-CM | POA: Diagnosis not present

## 2020-02-11 DIAGNOSIS — G8114 Spastic hemiplegia affecting left nondominant side: Secondary | ICD-10-CM | POA: Diagnosis not present

## 2020-02-11 DIAGNOSIS — M810 Age-related osteoporosis without current pathological fracture: Secondary | ICD-10-CM | POA: Diagnosis not present

## 2020-02-11 DIAGNOSIS — G4730 Sleep apnea, unspecified: Secondary | ICD-10-CM | POA: Diagnosis not present

## 2020-02-11 DIAGNOSIS — R4182 Altered mental status, unspecified: Secondary | ICD-10-CM | POA: Diagnosis not present

## 2020-02-11 DIAGNOSIS — C50919 Malignant neoplasm of unspecified site of unspecified female breast: Secondary | ICD-10-CM | POA: Diagnosis not present

## 2020-02-11 DIAGNOSIS — C031 Malignant neoplasm of lower gum: Secondary | ICD-10-CM | POA: Diagnosis not present

## 2020-02-11 DIAGNOSIS — M4722 Other spondylosis with radiculopathy, cervical region: Secondary | ICD-10-CM | POA: Diagnosis not present

## 2020-02-11 DIAGNOSIS — I4891 Unspecified atrial fibrillation: Secondary | ICD-10-CM | POA: Diagnosis not present

## 2020-02-11 DIAGNOSIS — J9612 Chronic respiratory failure with hypercapnia: Secondary | ICD-10-CM | POA: Diagnosis not present

## 2020-02-11 DIAGNOSIS — H35033 Hypertensive retinopathy, bilateral: Secondary | ICD-10-CM | POA: Diagnosis not present

## 2020-02-11 DIAGNOSIS — K635 Polyp of colon: Secondary | ICD-10-CM | POA: Diagnosis not present

## 2020-02-11 DIAGNOSIS — E890 Postprocedural hypothyroidism: Secondary | ICD-10-CM | POA: Diagnosis not present

## 2020-02-11 DIAGNOSIS — L0889 Other specified local infections of the skin and subcutaneous tissue: Secondary | ICD-10-CM | POA: Diagnosis not present

## 2020-02-11 DIAGNOSIS — Z4789 Encounter for other orthopedic aftercare: Secondary | ICD-10-CM | POA: Diagnosis not present

## 2020-02-11 DIAGNOSIS — Z96652 Presence of left artificial knee joint: Secondary | ICD-10-CM | POA: Diagnosis not present

## 2020-02-11 DIAGNOSIS — J479 Bronchiectasis, uncomplicated: Secondary | ICD-10-CM | POA: Diagnosis not present

## 2020-02-11 DIAGNOSIS — J4540 Moderate persistent asthma, uncomplicated: Secondary | ICD-10-CM | POA: Diagnosis not present

## 2020-02-11 DIAGNOSIS — Q242 Cor triatriatum: Secondary | ICD-10-CM | POA: Diagnosis not present

## 2020-02-11 DIAGNOSIS — I440 Atrioventricular block, first degree: Secondary | ICD-10-CM | POA: Diagnosis not present

## 2020-02-11 DIAGNOSIS — G4089 Other seizures: Secondary | ICD-10-CM | POA: Diagnosis not present

## 2020-02-11 DIAGNOSIS — R7402 Elevation of levels of lactic acid dehydrogenase (LDH): Secondary | ICD-10-CM | POA: Diagnosis not present

## 2020-02-11 DIAGNOSIS — I21A1 Myocardial infarction type 2: Secondary | ICD-10-CM | POA: Diagnosis not present

## 2020-02-11 DIAGNOSIS — Z793 Long term (current) use of hormonal contraceptives: Secondary | ICD-10-CM | POA: Diagnosis not present

## 2020-02-11 DIAGNOSIS — R109 Unspecified abdominal pain: Secondary | ICD-10-CM | POA: Diagnosis not present

## 2020-02-11 DIAGNOSIS — E161 Other hypoglycemia: Secondary | ICD-10-CM | POA: Diagnosis not present

## 2020-02-11 DIAGNOSIS — K30 Functional dyspepsia: Secondary | ICD-10-CM | POA: Diagnosis not present

## 2020-02-11 DIAGNOSIS — F17210 Nicotine dependence, cigarettes, uncomplicated: Secondary | ICD-10-CM | POA: Diagnosis not present

## 2020-02-11 DIAGNOSIS — D631 Anemia in chronic kidney disease: Secondary | ICD-10-CM | POA: Diagnosis not present

## 2020-02-11 DIAGNOSIS — E039 Hypothyroidism, unspecified: Secondary | ICD-10-CM | POA: Diagnosis not present

## 2020-02-11 DIAGNOSIS — E7800 Pure hypercholesterolemia, unspecified: Secondary | ICD-10-CM | POA: Diagnosis not present

## 2020-02-11 DIAGNOSIS — S46011A Strain of muscle(s) and tendon(s) of the rotator cuff of right shoulder, initial encounter: Secondary | ICD-10-CM | POA: Diagnosis not present

## 2020-02-11 DIAGNOSIS — F410 Panic disorder [episodic paroxysmal anxiety] without agoraphobia: Secondary | ICD-10-CM | POA: Diagnosis not present

## 2020-02-11 DIAGNOSIS — G4731 Primary central sleep apnea: Secondary | ICD-10-CM | POA: Diagnosis not present

## 2020-02-11 DIAGNOSIS — D509 Iron deficiency anemia, unspecified: Secondary | ICD-10-CM | POA: Diagnosis not present

## 2020-02-11 DIAGNOSIS — N1831 Chronic kidney disease, stage 3a: Secondary | ICD-10-CM | POA: Diagnosis not present

## 2020-02-11 DIAGNOSIS — T148XXA Other injury of unspecified body region, initial encounter: Secondary | ICD-10-CM | POA: Diagnosis not present

## 2020-02-11 DIAGNOSIS — E1122 Type 2 diabetes mellitus with diabetic chronic kidney disease: Secondary | ICD-10-CM | POA: Diagnosis not present

## 2020-02-11 DIAGNOSIS — Z9889 Other specified postprocedural states: Secondary | ICD-10-CM | POA: Diagnosis not present

## 2020-02-11 DIAGNOSIS — J439 Emphysema, unspecified: Secondary | ICD-10-CM | POA: Diagnosis not present

## 2020-02-11 DIAGNOSIS — R9389 Abnormal findings on diagnostic imaging of other specified body structures: Secondary | ICD-10-CM | POA: Diagnosis not present

## 2020-02-11 DIAGNOSIS — K3189 Other diseases of stomach and duodenum: Secondary | ICD-10-CM | POA: Diagnosis not present

## 2020-02-11 DIAGNOSIS — Z6828 Body mass index (BMI) 28.0-28.9, adult: Secondary | ICD-10-CM | POA: Diagnosis not present

## 2020-02-11 DIAGNOSIS — E559 Vitamin D deficiency, unspecified: Secondary | ICD-10-CM | POA: Diagnosis not present

## 2020-02-11 DIAGNOSIS — R0602 Shortness of breath: Secondary | ICD-10-CM | POA: Diagnosis not present

## 2020-02-11 DIAGNOSIS — M25512 Pain in left shoulder: Secondary | ICD-10-CM | POA: Diagnosis not present

## 2020-02-11 DIAGNOSIS — I4581 Long QT syndrome: Secondary | ICD-10-CM | POA: Diagnosis not present

## 2020-02-11 DIAGNOSIS — M8580 Other specified disorders of bone density and structure, unspecified site: Secondary | ICD-10-CM | POA: Diagnosis not present

## 2020-02-11 DIAGNOSIS — G2581 Restless legs syndrome: Secondary | ICD-10-CM | POA: Diagnosis not present

## 2020-02-11 DIAGNOSIS — M316 Other giant cell arteritis: Secondary | ICD-10-CM | POA: Diagnosis not present

## 2020-02-11 DIAGNOSIS — H538 Other visual disturbances: Secondary | ICD-10-CM | POA: Diagnosis not present

## 2020-02-11 DIAGNOSIS — I609 Nontraumatic subarachnoid hemorrhage, unspecified: Secondary | ICD-10-CM | POA: Diagnosis not present

## 2020-02-11 DIAGNOSIS — F319 Bipolar disorder, unspecified: Secondary | ICD-10-CM | POA: Diagnosis not present

## 2020-02-11 DIAGNOSIS — M5416 Radiculopathy, lumbar region: Secondary | ICD-10-CM | POA: Diagnosis not present

## 2020-02-11 DIAGNOSIS — Q189 Congenital malformation of face and neck, unspecified: Secondary | ICD-10-CM | POA: Diagnosis not present

## 2020-02-11 DIAGNOSIS — R0789 Other chest pain: Secondary | ICD-10-CM | POA: Diagnosis not present

## 2020-02-11 DIAGNOSIS — Z8673 Personal history of transient ischemic attack (TIA), and cerebral infarction without residual deficits: Secondary | ICD-10-CM | POA: Diagnosis not present

## 2020-02-11 DIAGNOSIS — K2950 Unspecified chronic gastritis without bleeding: Secondary | ICD-10-CM | POA: Diagnosis not present

## 2020-02-11 DIAGNOSIS — W25XXXA Contact with sharp glass, initial encounter: Secondary | ICD-10-CM | POA: Diagnosis not present

## 2020-02-11 DIAGNOSIS — M7541 Impingement syndrome of right shoulder: Secondary | ICD-10-CM | POA: Diagnosis not present

## 2020-02-11 DIAGNOSIS — T827XXA Infection and inflammatory reaction due to other cardiac and vascular devices, implants and grafts, initial encounter: Secondary | ICD-10-CM | POA: Diagnosis not present

## 2020-02-11 DIAGNOSIS — Z48812 Encounter for surgical aftercare following surgery on the circulatory system: Secondary | ICD-10-CM | POA: Diagnosis not present

## 2020-02-11 DIAGNOSIS — N182 Chronic kidney disease, stage 2 (mild): Secondary | ICD-10-CM | POA: Diagnosis not present

## 2020-02-11 DIAGNOSIS — Z79891 Long term (current) use of opiate analgesic: Secondary | ICD-10-CM | POA: Diagnosis not present

## 2020-02-11 DIAGNOSIS — K824 Cholesterolosis of gallbladder: Secondary | ICD-10-CM | POA: Diagnosis not present

## 2020-02-11 DIAGNOSIS — M47817 Spondylosis without myelopathy or radiculopathy, lumbosacral region: Secondary | ICD-10-CM | POA: Diagnosis not present

## 2020-02-11 DIAGNOSIS — R5381 Other malaise: Secondary | ICD-10-CM | POA: Diagnosis not present

## 2020-02-11 DIAGNOSIS — G4733 Obstructive sleep apnea (adult) (pediatric): Secondary | ICD-10-CM | POA: Diagnosis not present

## 2020-02-11 DIAGNOSIS — Z743 Need for continuous supervision: Secondary | ICD-10-CM | POA: Diagnosis not present

## 2020-02-11 DIAGNOSIS — R1319 Other dysphagia: Secondary | ICD-10-CM | POA: Diagnosis not present

## 2020-02-11 DIAGNOSIS — H903 Sensorineural hearing loss, bilateral: Secondary | ICD-10-CM | POA: Diagnosis not present

## 2020-02-11 DIAGNOSIS — R262 Difficulty in walking, not elsewhere classified: Secondary | ICD-10-CM | POA: Diagnosis not present

## 2020-02-11 DIAGNOSIS — N201 Calculus of ureter: Secondary | ICD-10-CM | POA: Diagnosis not present

## 2020-02-11 DIAGNOSIS — N400 Enlarged prostate without lower urinary tract symptoms: Secondary | ICD-10-CM | POA: Diagnosis not present

## 2020-02-11 DIAGNOSIS — R1032 Left lower quadrant pain: Secondary | ICD-10-CM | POA: Diagnosis not present

## 2020-02-11 DIAGNOSIS — K219 Gastro-esophageal reflux disease without esophagitis: Secondary | ICD-10-CM | POA: Diagnosis not present

## 2020-02-11 DIAGNOSIS — N189 Chronic kidney disease, unspecified: Secondary | ICD-10-CM | POA: Diagnosis not present

## 2020-02-11 DIAGNOSIS — J0100 Acute maxillary sinusitis, unspecified: Secondary | ICD-10-CM | POA: Diagnosis not present

## 2020-02-11 DIAGNOSIS — Z9689 Presence of other specified functional implants: Secondary | ICD-10-CM | POA: Diagnosis not present

## 2020-02-11 DIAGNOSIS — Z1211 Encounter for screening for malignant neoplasm of colon: Secondary | ICD-10-CM | POA: Diagnosis not present

## 2020-02-11 DIAGNOSIS — E1140 Type 2 diabetes mellitus with diabetic neuropathy, unspecified: Secondary | ICD-10-CM | POA: Diagnosis not present

## 2020-02-11 DIAGNOSIS — H02889 Meibomian gland dysfunction of unspecified eye, unspecified eyelid: Secondary | ICD-10-CM | POA: Diagnosis not present

## 2020-02-11 DIAGNOSIS — F3181 Bipolar II disorder: Secondary | ICD-10-CM | POA: Diagnosis not present

## 2020-02-11 DIAGNOSIS — I2510 Atherosclerotic heart disease of native coronary artery without angina pectoris: Secondary | ICD-10-CM | POA: Diagnosis not present

## 2020-02-11 DIAGNOSIS — D225 Melanocytic nevi of trunk: Secondary | ICD-10-CM | POA: Diagnosis not present

## 2020-02-11 DIAGNOSIS — K7290 Hepatic failure, unspecified without coma: Secondary | ICD-10-CM | POA: Diagnosis not present

## 2020-02-11 DIAGNOSIS — Z08 Encounter for follow-up examination after completed treatment for malignant neoplasm: Secondary | ICD-10-CM | POA: Diagnosis not present

## 2020-02-11 DIAGNOSIS — R350 Frequency of micturition: Secondary | ICD-10-CM | POA: Diagnosis not present

## 2020-02-11 DIAGNOSIS — F411 Generalized anxiety disorder: Secondary | ICD-10-CM | POA: Diagnosis not present

## 2020-02-11 DIAGNOSIS — Z6826 Body mass index (BMI) 26.0-26.9, adult: Secondary | ICD-10-CM | POA: Diagnosis not present

## 2020-02-11 DIAGNOSIS — F39 Unspecified mood [affective] disorder: Secondary | ICD-10-CM | POA: Diagnosis not present

## 2020-02-11 DIAGNOSIS — Z79899 Other long term (current) drug therapy: Secondary | ICD-10-CM | POA: Diagnosis not present

## 2020-02-11 DIAGNOSIS — Z170 Estrogen receptor positive status [ER+]: Secondary | ICD-10-CM | POA: Diagnosis not present

## 2020-02-11 DIAGNOSIS — M6281 Muscle weakness (generalized): Secondary | ICD-10-CM | POA: Diagnosis not present

## 2020-02-11 DIAGNOSIS — G20 Parkinson's disease: Secondary | ICD-10-CM | POA: Diagnosis not present

## 2020-02-11 DIAGNOSIS — I5040 Unspecified combined systolic (congestive) and diastolic (congestive) heart failure: Secondary | ICD-10-CM | POA: Diagnosis not present

## 2020-02-11 DIAGNOSIS — Z85820 Personal history of malignant melanoma of skin: Secondary | ICD-10-CM | POA: Diagnosis not present

## 2020-02-11 DIAGNOSIS — H02401 Unspecified ptosis of right eyelid: Secondary | ICD-10-CM | POA: Diagnosis not present

## 2020-02-11 DIAGNOSIS — M48061 Spinal stenosis, lumbar region without neurogenic claudication: Secondary | ICD-10-CM | POA: Diagnosis not present

## 2020-02-11 DIAGNOSIS — I25119 Atherosclerotic heart disease of native coronary artery with unspecified angina pectoris: Secondary | ICD-10-CM | POA: Diagnosis not present

## 2020-02-11 DIAGNOSIS — J90 Pleural effusion, not elsewhere classified: Secondary | ICD-10-CM | POA: Diagnosis not present

## 2020-02-11 DIAGNOSIS — I5030 Unspecified diastolic (congestive) heart failure: Secondary | ICD-10-CM | POA: Diagnosis not present

## 2020-02-11 DIAGNOSIS — S0990XA Unspecified injury of head, initial encounter: Secondary | ICD-10-CM | POA: Diagnosis not present

## 2020-02-11 DIAGNOSIS — E790 Hyperuricemia without signs of inflammatory arthritis and tophaceous disease: Secondary | ICD-10-CM | POA: Diagnosis not present

## 2020-02-11 DIAGNOSIS — K7030 Alcoholic cirrhosis of liver without ascites: Secondary | ICD-10-CM | POA: Diagnosis not present

## 2020-02-11 DIAGNOSIS — E11610 Type 2 diabetes mellitus with diabetic neuropathic arthropathy: Secondary | ICD-10-CM | POA: Diagnosis not present

## 2020-02-11 DIAGNOSIS — N951 Menopausal and female climacteric states: Secondary | ICD-10-CM | POA: Diagnosis not present

## 2020-02-11 DIAGNOSIS — I25110 Atherosclerotic heart disease of native coronary artery with unstable angina pectoris: Secondary | ICD-10-CM | POA: Diagnosis not present

## 2020-02-11 DIAGNOSIS — K259 Gastric ulcer, unspecified as acute or chronic, without hemorrhage or perforation: Secondary | ICD-10-CM | POA: Diagnosis not present

## 2020-02-11 DIAGNOSIS — Z452 Encounter for adjustment and management of vascular access device: Secondary | ICD-10-CM | POA: Diagnosis not present

## 2020-02-11 DIAGNOSIS — R778 Other specified abnormalities of plasma proteins: Secondary | ICD-10-CM | POA: Diagnosis not present

## 2020-02-11 DIAGNOSIS — H9190 Unspecified hearing loss, unspecified ear: Secondary | ICD-10-CM | POA: Diagnosis not present

## 2020-02-11 DIAGNOSIS — Z8616 Personal history of COVID-19: Secondary | ICD-10-CM | POA: Diagnosis not present

## 2020-02-11 DIAGNOSIS — R42 Dizziness and giddiness: Secondary | ICD-10-CM | POA: Diagnosis not present

## 2020-02-11 DIAGNOSIS — D649 Anemia, unspecified: Secondary | ICD-10-CM | POA: Diagnosis not present

## 2020-02-11 DIAGNOSIS — G309 Alzheimer's disease, unspecified: Secondary | ICD-10-CM | POA: Diagnosis not present

## 2020-02-11 DIAGNOSIS — F4321 Adjustment disorder with depressed mood: Secondary | ICD-10-CM | POA: Diagnosis not present

## 2020-02-11 DIAGNOSIS — F3178 Bipolar disorder, in full remission, most recent episode mixed: Secondary | ICD-10-CM | POA: Diagnosis not present

## 2020-02-11 DIAGNOSIS — S92911D Unspecified fracture of right toe(s), subsequent encounter for fracture with routine healing: Secondary | ICD-10-CM | POA: Diagnosis not present

## 2020-02-11 DIAGNOSIS — E1165 Type 2 diabetes mellitus with hyperglycemia: Secondary | ICD-10-CM | POA: Diagnosis not present

## 2020-02-11 DIAGNOSIS — E883 Tumor lysis syndrome: Secondary | ICD-10-CM | POA: Diagnosis not present

## 2020-02-11 DIAGNOSIS — L304 Erythema intertrigo: Secondary | ICD-10-CM | POA: Diagnosis not present

## 2020-02-11 DIAGNOSIS — T451X5A Adverse effect of antineoplastic and immunosuppressive drugs, initial encounter: Secondary | ICD-10-CM | POA: Diagnosis not present

## 2020-02-11 DIAGNOSIS — C50411 Malignant neoplasm of upper-outer quadrant of right female breast: Secondary | ICD-10-CM | POA: Diagnosis not present

## 2020-02-11 DIAGNOSIS — M170 Bilateral primary osteoarthritis of knee: Secondary | ICD-10-CM | POA: Diagnosis not present

## 2020-02-11 DIAGNOSIS — R41844 Frontal lobe and executive function deficit: Secondary | ICD-10-CM | POA: Diagnosis not present

## 2020-02-11 DIAGNOSIS — C4492 Squamous cell carcinoma of skin, unspecified: Secondary | ICD-10-CM | POA: Diagnosis not present

## 2020-02-11 DIAGNOSIS — E538 Deficiency of other specified B group vitamins: Secondary | ICD-10-CM | POA: Diagnosis not present

## 2020-02-11 DIAGNOSIS — J9811 Atelectasis: Secondary | ICD-10-CM | POA: Diagnosis not present

## 2020-02-11 DIAGNOSIS — I5023 Acute on chronic systolic (congestive) heart failure: Secondary | ICD-10-CM | POA: Diagnosis not present

## 2020-02-11 DIAGNOSIS — N3281 Overactive bladder: Secondary | ICD-10-CM | POA: Diagnosis not present

## 2020-02-11 DIAGNOSIS — Z712 Person consulting for explanation of examination or test findings: Secondary | ICD-10-CM | POA: Diagnosis not present

## 2020-02-11 DIAGNOSIS — R200 Anesthesia of skin: Secondary | ICD-10-CM | POA: Diagnosis not present

## 2020-02-11 DIAGNOSIS — Z96642 Presence of left artificial hip joint: Secondary | ICD-10-CM | POA: Diagnosis not present

## 2020-02-11 DIAGNOSIS — J40 Bronchitis, not specified as acute or chronic: Secondary | ICD-10-CM | POA: Diagnosis not present

## 2020-02-11 DIAGNOSIS — K5909 Other constipation: Secondary | ICD-10-CM | POA: Diagnosis not present

## 2020-02-11 DIAGNOSIS — G8929 Other chronic pain: Secondary | ICD-10-CM | POA: Diagnosis not present

## 2020-02-11 DIAGNOSIS — G629 Polyneuropathy, unspecified: Secondary | ICD-10-CM | POA: Diagnosis not present

## 2020-02-11 DIAGNOSIS — F4322 Adjustment disorder with anxiety: Secondary | ICD-10-CM | POA: Diagnosis not present

## 2020-02-11 DIAGNOSIS — B957 Other staphylococcus as the cause of diseases classified elsewhere: Secondary | ICD-10-CM | POA: Diagnosis not present

## 2020-02-11 DIAGNOSIS — Z8261 Family history of arthritis: Secondary | ICD-10-CM | POA: Diagnosis not present

## 2020-02-11 DIAGNOSIS — Z7952 Long term (current) use of systemic steroids: Secondary | ICD-10-CM | POA: Diagnosis not present

## 2020-02-11 DIAGNOSIS — M25551 Pain in right hip: Secondary | ICD-10-CM | POA: Diagnosis not present

## 2020-02-11 DIAGNOSIS — R1011 Right upper quadrant pain: Secondary | ICD-10-CM | POA: Diagnosis not present

## 2020-02-11 DIAGNOSIS — R682 Dry mouth, unspecified: Secondary | ICD-10-CM | POA: Diagnosis not present

## 2020-02-11 DIAGNOSIS — I77811 Abdominal aortic ectasia: Secondary | ICD-10-CM | POA: Diagnosis not present

## 2020-02-11 DIAGNOSIS — T63461D Toxic effect of venom of wasps, accidental (unintentional), subsequent encounter: Secondary | ICD-10-CM | POA: Diagnosis not present

## 2020-02-11 DIAGNOSIS — X32XXXD Exposure to sunlight, subsequent encounter: Secondary | ICD-10-CM | POA: Diagnosis not present

## 2020-02-11 DIAGNOSIS — F419 Anxiety disorder, unspecified: Secondary | ICD-10-CM | POA: Diagnosis not present

## 2020-02-11 DIAGNOSIS — M5441 Lumbago with sciatica, right side: Secondary | ICD-10-CM | POA: Diagnosis not present

## 2020-02-11 DIAGNOSIS — H43813 Vitreous degeneration, bilateral: Secondary | ICD-10-CM | POA: Diagnosis not present

## 2020-02-11 DIAGNOSIS — B351 Tinea unguium: Secondary | ICD-10-CM | POA: Diagnosis not present

## 2020-02-11 DIAGNOSIS — Z01818 Encounter for other preprocedural examination: Secondary | ICD-10-CM | POA: Diagnosis not present

## 2020-02-11 DIAGNOSIS — C50912 Malignant neoplasm of unspecified site of left female breast: Secondary | ICD-10-CM | POA: Diagnosis not present

## 2020-02-11 DIAGNOSIS — H25812 Combined forms of age-related cataract, left eye: Secondary | ICD-10-CM | POA: Diagnosis not present

## 2020-02-11 DIAGNOSIS — I129 Hypertensive chronic kidney disease with stage 1 through stage 4 chronic kidney disease, or unspecified chronic kidney disease: Secondary | ICD-10-CM | POA: Diagnosis not present

## 2020-02-11 DIAGNOSIS — I201 Angina pectoris with documented spasm: Secondary | ICD-10-CM | POA: Diagnosis not present

## 2020-02-11 DIAGNOSIS — K31811 Angiodysplasia of stomach and duodenum with bleeding: Secondary | ICD-10-CM | POA: Diagnosis not present

## 2020-02-11 DIAGNOSIS — N390 Urinary tract infection, site not specified: Secondary | ICD-10-CM | POA: Diagnosis not present

## 2020-02-11 DIAGNOSIS — C50911 Malignant neoplasm of unspecified site of right female breast: Secondary | ICD-10-CM | POA: Diagnosis not present

## 2020-02-11 DIAGNOSIS — Z7983 Long term (current) use of bisphosphonates: Secondary | ICD-10-CM | POA: Diagnosis not present

## 2020-02-11 DIAGNOSIS — S52571A Other intraarticular fracture of lower end of right radius, initial encounter for closed fracture: Secondary | ICD-10-CM | POA: Diagnosis not present

## 2020-02-11 DIAGNOSIS — M79605 Pain in left leg: Secondary | ICD-10-CM | POA: Diagnosis not present

## 2020-02-11 DIAGNOSIS — H532 Diplopia: Secondary | ICD-10-CM | POA: Diagnosis not present

## 2020-02-11 DIAGNOSIS — I739 Peripheral vascular disease, unspecified: Secondary | ICD-10-CM | POA: Diagnosis not present

## 2020-02-11 DIAGNOSIS — F17290 Nicotine dependence, other tobacco product, uncomplicated: Secondary | ICD-10-CM | POA: Diagnosis not present

## 2020-02-11 DIAGNOSIS — I509 Heart failure, unspecified: Secondary | ICD-10-CM | POA: Diagnosis not present

## 2020-02-11 DIAGNOSIS — I5022 Chronic systolic (congestive) heart failure: Secondary | ICD-10-CM | POA: Diagnosis not present

## 2020-02-11 DIAGNOSIS — N184 Chronic kidney disease, stage 4 (severe): Secondary | ICD-10-CM | POA: Diagnosis not present

## 2020-02-11 DIAGNOSIS — G311 Senile degeneration of brain, not elsewhere classified: Secondary | ICD-10-CM | POA: Diagnosis not present

## 2020-02-11 DIAGNOSIS — M25552 Pain in left hip: Secondary | ICD-10-CM | POA: Diagnosis not present

## 2020-02-11 DIAGNOSIS — C44722 Squamous cell carcinoma of skin of right lower limb, including hip: Secondary | ICD-10-CM | POA: Diagnosis not present

## 2020-02-11 DIAGNOSIS — Z7902 Long term (current) use of antithrombotics/antiplatelets: Secondary | ICD-10-CM | POA: Diagnosis not present

## 2020-02-11 DIAGNOSIS — I459 Conduction disorder, unspecified: Secondary | ICD-10-CM | POA: Diagnosis not present

## 2020-02-11 DIAGNOSIS — R413 Other amnesia: Secondary | ICD-10-CM | POA: Diagnosis not present

## 2020-02-11 DIAGNOSIS — I219 Acute myocardial infarction, unspecified: Secondary | ICD-10-CM | POA: Diagnosis not present

## 2020-02-11 DIAGNOSIS — R238 Other skin changes: Secondary | ICD-10-CM | POA: Diagnosis not present

## 2020-02-11 DIAGNOSIS — I639 Cerebral infarction, unspecified: Secondary | ICD-10-CM | POA: Diagnosis not present

## 2020-02-11 DIAGNOSIS — F0390 Unspecified dementia without behavioral disturbance: Secondary | ICD-10-CM | POA: Diagnosis not present

## 2020-02-11 DIAGNOSIS — R110 Nausea: Secondary | ICD-10-CM | POA: Diagnosis not present

## 2020-02-11 DIAGNOSIS — I712 Thoracic aortic aneurysm, without rupture: Secondary | ICD-10-CM | POA: Diagnosis not present

## 2020-02-11 DIAGNOSIS — D720 Genetic anomalies of leukocytes: Secondary | ICD-10-CM | POA: Diagnosis not present

## 2020-02-11 DIAGNOSIS — T63441D Toxic effect of venom of bees, accidental (unintentional), subsequent encounter: Secondary | ICD-10-CM | POA: Diagnosis not present

## 2020-02-11 DIAGNOSIS — I2722 Pulmonary hypertension due to left heart disease: Secondary | ICD-10-CM | POA: Diagnosis not present

## 2020-02-11 DIAGNOSIS — M25652 Stiffness of left hip, not elsewhere classified: Secondary | ICD-10-CM | POA: Diagnosis not present

## 2020-02-11 DIAGNOSIS — Z9011 Acquired absence of right breast and nipple: Secondary | ICD-10-CM | POA: Diagnosis not present

## 2020-02-11 DIAGNOSIS — N186 End stage renal disease: Secondary | ICD-10-CM | POA: Diagnosis not present

## 2020-02-11 DIAGNOSIS — C8312 Mantle cell lymphoma, intrathoracic lymph nodes: Secondary | ICD-10-CM | POA: Diagnosis not present

## 2020-02-11 DIAGNOSIS — I080 Rheumatic disorders of both mitral and aortic valves: Secondary | ICD-10-CM | POA: Diagnosis not present

## 2020-02-11 DIAGNOSIS — E441 Mild protein-calorie malnutrition: Secondary | ICD-10-CM | POA: Diagnosis not present

## 2020-02-11 DIAGNOSIS — I959 Hypotension, unspecified: Secondary | ICD-10-CM | POA: Diagnosis not present

## 2020-02-11 DIAGNOSIS — Z48817 Encounter for surgical aftercare following surgery on the skin and subcutaneous tissue: Secondary | ICD-10-CM | POA: Diagnosis not present

## 2020-02-11 DIAGNOSIS — H353231 Exudative age-related macular degeneration, bilateral, with active choroidal neovascularization: Secondary | ICD-10-CM | POA: Diagnosis not present

## 2020-02-11 DIAGNOSIS — M9902 Segmental and somatic dysfunction of thoracic region: Secondary | ICD-10-CM | POA: Diagnosis not present

## 2020-02-11 DIAGNOSIS — H1089 Other conjunctivitis: Secondary | ICD-10-CM | POA: Diagnosis not present

## 2020-02-11 DIAGNOSIS — Z9884 Bariatric surgery status: Secondary | ICD-10-CM | POA: Diagnosis not present

## 2020-02-11 DIAGNOSIS — K2980 Duodenitis without bleeding: Secondary | ICD-10-CM | POA: Diagnosis not present

## 2020-02-11 DIAGNOSIS — K766 Portal hypertension: Secondary | ICD-10-CM | POA: Diagnosis not present

## 2020-02-11 DIAGNOSIS — M25511 Pain in right shoulder: Secondary | ICD-10-CM | POA: Diagnosis not present

## 2020-02-11 DIAGNOSIS — I69344 Monoplegia of lower limb following cerebral infarction affecting left non-dominant side: Secondary | ICD-10-CM | POA: Diagnosis not present

## 2020-02-11 DIAGNOSIS — K2970 Gastritis, unspecified, without bleeding: Secondary | ICD-10-CM | POA: Diagnosis not present

## 2020-02-11 DIAGNOSIS — C44229 Squamous cell carcinoma of skin of left ear and external auricular canal: Secondary | ICD-10-CM | POA: Diagnosis not present

## 2020-02-11 DIAGNOSIS — K625 Hemorrhage of anus and rectum: Secondary | ICD-10-CM | POA: Diagnosis not present

## 2020-02-11 DIAGNOSIS — R102 Pelvic and perineal pain: Secondary | ICD-10-CM | POA: Diagnosis not present

## 2020-02-11 DIAGNOSIS — M151 Heberden's nodes (with arthropathy): Secondary | ICD-10-CM | POA: Diagnosis not present

## 2020-02-11 DIAGNOSIS — Z794 Long term (current) use of insulin: Secondary | ICD-10-CM | POA: Diagnosis not present

## 2020-02-11 DIAGNOSIS — J441 Chronic obstructive pulmonary disease with (acute) exacerbation: Secondary | ICD-10-CM | POA: Diagnosis not present

## 2020-02-11 DIAGNOSIS — R7303 Prediabetes: Secondary | ICD-10-CM | POA: Diagnosis not present

## 2020-02-11 DIAGNOSIS — R601 Generalized edema: Secondary | ICD-10-CM | POA: Diagnosis not present

## 2020-02-11 DIAGNOSIS — R9431 Abnormal electrocardiogram [ECG] [EKG]: Secondary | ICD-10-CM | POA: Diagnosis not present

## 2020-02-11 DIAGNOSIS — Z8711 Personal history of peptic ulcer disease: Secondary | ICD-10-CM | POA: Diagnosis not present

## 2020-02-11 DIAGNOSIS — I469 Cardiac arrest, cause unspecified: Secondary | ICD-10-CM | POA: Diagnosis not present

## 2020-02-11 DIAGNOSIS — M1612 Unilateral primary osteoarthritis, left hip: Secondary | ICD-10-CM | POA: Diagnosis not present

## 2020-02-11 DIAGNOSIS — M25562 Pain in left knee: Secondary | ICD-10-CM | POA: Diagnosis not present

## 2020-02-11 DIAGNOSIS — J189 Pneumonia, unspecified organism: Secondary | ICD-10-CM | POA: Diagnosis not present

## 2020-02-11 DIAGNOSIS — G4700 Insomnia, unspecified: Secondary | ICD-10-CM | POA: Diagnosis not present

## 2020-02-11 DIAGNOSIS — L989 Disorder of the skin and subcutaneous tissue, unspecified: Secondary | ICD-10-CM | POA: Diagnosis not present

## 2020-02-11 DIAGNOSIS — R319 Hematuria, unspecified: Secondary | ICD-10-CM | POA: Diagnosis not present

## 2020-02-11 DIAGNOSIS — J948 Other specified pleural conditions: Secondary | ICD-10-CM | POA: Diagnosis not present

## 2020-02-11 DIAGNOSIS — Z6838 Body mass index (BMI) 38.0-38.9, adult: Secondary | ICD-10-CM | POA: Diagnosis not present

## 2020-02-11 DIAGNOSIS — Z96651 Presence of right artificial knee joint: Secondary | ICD-10-CM | POA: Diagnosis not present

## 2020-02-11 DIAGNOSIS — M542 Cervicalgia: Secondary | ICD-10-CM | POA: Diagnosis not present

## 2020-02-11 DIAGNOSIS — I4901 Ventricular fibrillation: Secondary | ICD-10-CM | POA: Diagnosis not present

## 2020-02-11 DIAGNOSIS — F325 Major depressive disorder, single episode, in full remission: Secondary | ICD-10-CM | POA: Diagnosis not present

## 2020-02-11 DIAGNOSIS — Z713 Dietary counseling and surveillance: Secondary | ICD-10-CM | POA: Diagnosis not present

## 2020-02-11 DIAGNOSIS — Z471 Aftercare following joint replacement surgery: Secondary | ICD-10-CM | POA: Diagnosis not present

## 2020-02-11 DIAGNOSIS — K254 Chronic or unspecified gastric ulcer with hemorrhage: Secondary | ICD-10-CM | POA: Diagnosis not present

## 2020-02-11 DIAGNOSIS — E1159 Type 2 diabetes mellitus with other circulatory complications: Secondary | ICD-10-CM | POA: Diagnosis not present

## 2020-02-11 DIAGNOSIS — E782 Mixed hyperlipidemia: Secondary | ICD-10-CM | POA: Diagnosis not present

## 2020-02-11 DIAGNOSIS — Z7984 Long term (current) use of oral hypoglycemic drugs: Secondary | ICD-10-CM | POA: Diagnosis not present

## 2020-02-11 DIAGNOSIS — Z8639 Personal history of other endocrine, nutritional and metabolic disease: Secondary | ICD-10-CM | POA: Diagnosis not present

## 2020-02-11 DIAGNOSIS — Z7982 Long term (current) use of aspirin: Secondary | ICD-10-CM | POA: Diagnosis not present

## 2020-02-11 DIAGNOSIS — I110 Hypertensive heart disease with heart failure: Secondary | ICD-10-CM | POA: Diagnosis not present

## 2020-02-11 DIAGNOSIS — Z95810 Presence of automatic (implantable) cardiac defibrillator: Secondary | ICD-10-CM | POA: Diagnosis not present

## 2020-02-11 DIAGNOSIS — Z7901 Long term (current) use of anticoagulants: Secondary | ICD-10-CM | POA: Diagnosis not present

## 2020-02-11 DIAGNOSIS — Z7409 Other reduced mobility: Secondary | ICD-10-CM | POA: Diagnosis not present

## 2020-02-11 DIAGNOSIS — D125 Benign neoplasm of sigmoid colon: Secondary | ICD-10-CM | POA: Diagnosis not present

## 2020-02-11 DIAGNOSIS — M546 Pain in thoracic spine: Secondary | ICD-10-CM | POA: Diagnosis not present

## 2020-02-11 DIAGNOSIS — E119 Type 2 diabetes mellitus without complications: Secondary | ICD-10-CM | POA: Diagnosis not present

## 2020-02-11 DIAGNOSIS — Z85828 Personal history of other malignant neoplasm of skin: Secondary | ICD-10-CM | POA: Diagnosis not present

## 2020-02-11 DIAGNOSIS — I69393 Ataxia following cerebral infarction: Secondary | ICD-10-CM | POA: Diagnosis not present

## 2020-02-11 DIAGNOSIS — Z947 Corneal transplant status: Secondary | ICD-10-CM | POA: Diagnosis not present

## 2020-02-11 DIAGNOSIS — K224 Dyskinesia of esophagus: Secondary | ICD-10-CM | POA: Diagnosis not present

## 2020-02-11 DIAGNOSIS — M5136 Other intervertebral disc degeneration, lumbar region: Secondary | ICD-10-CM | POA: Diagnosis not present

## 2020-02-11 DIAGNOSIS — J9611 Chronic respiratory failure with hypoxia: Secondary | ICD-10-CM | POA: Diagnosis not present

## 2020-02-11 DIAGNOSIS — M9905 Segmental and somatic dysfunction of pelvic region: Secondary | ICD-10-CM | POA: Diagnosis not present

## 2020-02-11 DIAGNOSIS — H409 Unspecified glaucoma: Secondary | ICD-10-CM | POA: Diagnosis not present

## 2020-02-11 DIAGNOSIS — J309 Allergic rhinitis, unspecified: Secondary | ICD-10-CM | POA: Diagnosis not present

## 2020-02-11 DIAGNOSIS — M1990 Unspecified osteoarthritis, unspecified site: Secondary | ICD-10-CM | POA: Diagnosis not present

## 2020-02-11 DIAGNOSIS — I69322 Dysarthria following cerebral infarction: Secondary | ICD-10-CM | POA: Diagnosis not present

## 2020-02-11 DIAGNOSIS — H269 Unspecified cataract: Secondary | ICD-10-CM | POA: Diagnosis not present

## 2020-02-11 DIAGNOSIS — D689 Coagulation defect, unspecified: Secondary | ICD-10-CM | POA: Diagnosis not present

## 2020-02-11 DIAGNOSIS — R4189 Other symptoms and signs involving cognitive functions and awareness: Secondary | ICD-10-CM | POA: Diagnosis not present

## 2020-02-11 DIAGNOSIS — M5137 Other intervertebral disc degeneration, lumbosacral region: Secondary | ICD-10-CM | POA: Diagnosis not present

## 2020-02-11 DIAGNOSIS — H11151 Pinguecula, right eye: Secondary | ICD-10-CM | POA: Diagnosis not present

## 2020-02-11 DIAGNOSIS — T63451D Toxic effect of venom of hornets, accidental (unintentional), subsequent encounter: Secondary | ICD-10-CM | POA: Diagnosis not present

## 2020-02-11 DIAGNOSIS — E871 Hypo-osmolality and hyponatremia: Secondary | ICD-10-CM | POA: Diagnosis not present

## 2020-02-11 DIAGNOSIS — F0280 Dementia in other diseases classified elsewhere without behavioral disturbance: Secondary | ICD-10-CM | POA: Diagnosis not present

## 2020-02-11 DIAGNOSIS — M5481 Occipital neuralgia: Secondary | ICD-10-CM | POA: Diagnosis not present

## 2020-02-11 DIAGNOSIS — R6889 Other general symptoms and signs: Secondary | ICD-10-CM | POA: Diagnosis not present

## 2020-02-11 DIAGNOSIS — Z780 Asymptomatic menopausal state: Secondary | ICD-10-CM | POA: Diagnosis not present

## 2020-02-11 DIAGNOSIS — R0689 Other abnormalities of breathing: Secondary | ICD-10-CM | POA: Diagnosis not present

## 2020-02-11 DIAGNOSIS — E1151 Type 2 diabetes mellitus with diabetic peripheral angiopathy without gangrene: Secondary | ICD-10-CM | POA: Diagnosis not present

## 2020-02-11 DIAGNOSIS — J301 Allergic rhinitis due to pollen: Secondary | ICD-10-CM | POA: Diagnosis not present

## 2020-02-11 DIAGNOSIS — F322 Major depressive disorder, single episode, severe without psychotic features: Secondary | ICD-10-CM | POA: Diagnosis not present

## 2020-02-11 DIAGNOSIS — G9341 Metabolic encephalopathy: Secondary | ICD-10-CM | POA: Diagnosis not present

## 2020-02-11 DIAGNOSIS — G35 Multiple sclerosis: Secondary | ICD-10-CM | POA: Diagnosis not present

## 2020-02-11 DIAGNOSIS — Z6841 Body Mass Index (BMI) 40.0 and over, adult: Secondary | ICD-10-CM | POA: Diagnosis not present

## 2020-02-11 DIAGNOSIS — C50412 Malignant neoplasm of upper-outer quadrant of left female breast: Secondary | ICD-10-CM | POA: Diagnosis not present

## 2020-02-11 DIAGNOSIS — C44319 Basal cell carcinoma of skin of other parts of face: Secondary | ICD-10-CM | POA: Diagnosis not present

## 2020-02-11 DIAGNOSIS — K449 Diaphragmatic hernia without obstruction or gangrene: Secondary | ICD-10-CM | POA: Diagnosis not present

## 2020-02-11 DIAGNOSIS — M7551 Bursitis of right shoulder: Secondary | ICD-10-CM | POA: Diagnosis not present

## 2020-02-11 DIAGNOSIS — M4807 Spinal stenosis, lumbosacral region: Secondary | ICD-10-CM | POA: Diagnosis not present

## 2020-02-11 DIAGNOSIS — R079 Chest pain, unspecified: Secondary | ICD-10-CM | POA: Diagnosis not present

## 2020-02-11 DIAGNOSIS — R55 Syncope and collapse: Secondary | ICD-10-CM | POA: Diagnosis not present

## 2020-02-11 DIAGNOSIS — E1142 Type 2 diabetes mellitus with diabetic polyneuropathy: Secondary | ICD-10-CM | POA: Diagnosis not present

## 2020-02-11 DIAGNOSIS — R1310 Dysphagia, unspecified: Secondary | ICD-10-CM | POA: Diagnosis not present

## 2020-02-11 DIAGNOSIS — R0600 Dyspnea, unspecified: Secondary | ICD-10-CM | POA: Diagnosis not present

## 2020-02-11 DIAGNOSIS — K5900 Constipation, unspecified: Secondary | ICD-10-CM | POA: Diagnosis not present

## 2020-02-11 DIAGNOSIS — C439 Malignant melanoma of skin, unspecified: Secondary | ICD-10-CM | POA: Diagnosis not present

## 2020-02-11 DIAGNOSIS — F330 Major depressive disorder, recurrent, mild: Secondary | ICD-10-CM | POA: Diagnosis not present

## 2020-02-11 DIAGNOSIS — C220 Liver cell carcinoma: Secondary | ICD-10-CM | POA: Diagnosis not present

## 2020-02-11 DIAGNOSIS — Z923 Personal history of irradiation: Secondary | ICD-10-CM | POA: Diagnosis not present

## 2020-02-11 DIAGNOSIS — R072 Precordial pain: Secondary | ICD-10-CM | POA: Diagnosis not present

## 2020-02-11 DIAGNOSIS — J3089 Other allergic rhinitis: Secondary | ICD-10-CM | POA: Diagnosis not present

## 2020-02-11 DIAGNOSIS — Z853 Personal history of malignant neoplasm of breast: Secondary | ICD-10-CM | POA: Diagnosis not present

## 2020-02-11 DIAGNOSIS — R918 Other nonspecific abnormal finding of lung field: Secondary | ICD-10-CM | POA: Diagnosis not present

## 2020-02-11 DIAGNOSIS — E876 Hypokalemia: Secondary | ICD-10-CM | POA: Diagnosis not present

## 2020-02-11 DIAGNOSIS — F429 Obsessive-compulsive disorder, unspecified: Secondary | ICD-10-CM | POA: Diagnosis not present

## 2020-02-11 DIAGNOSIS — N210 Calculus in bladder: Secondary | ICD-10-CM | POA: Diagnosis not present

## 2020-02-11 DIAGNOSIS — I729 Aneurysm of unspecified site: Secondary | ICD-10-CM | POA: Diagnosis not present

## 2020-02-11 DIAGNOSIS — N183 Chronic kidney disease, stage 3 unspecified: Secondary | ICD-10-CM | POA: Diagnosis not present

## 2020-02-11 DIAGNOSIS — C434 Malignant melanoma of scalp and neck: Secondary | ICD-10-CM | POA: Diagnosis not present

## 2020-02-11 DIAGNOSIS — Z955 Presence of coronary angioplasty implant and graft: Secondary | ICD-10-CM | POA: Diagnosis not present

## 2020-02-11 DIAGNOSIS — J4520 Mild intermittent asthma, uncomplicated: Secondary | ICD-10-CM | POA: Diagnosis not present

## 2020-02-11 DIAGNOSIS — C4339 Malignant melanoma of other parts of face: Secondary | ICD-10-CM | POA: Diagnosis not present

## 2020-02-11 DIAGNOSIS — R0902 Hypoxemia: Secondary | ICD-10-CM | POA: Diagnosis not present

## 2020-02-11 DIAGNOSIS — F418 Other specified anxiety disorders: Secondary | ICD-10-CM | POA: Diagnosis not present

## 2020-02-11 DIAGNOSIS — M25561 Pain in right knee: Secondary | ICD-10-CM | POA: Diagnosis not present

## 2020-02-11 DIAGNOSIS — R2689 Other abnormalities of gait and mobility: Secondary | ICD-10-CM | POA: Diagnosis not present

## 2020-02-11 DIAGNOSIS — G5601 Carpal tunnel syndrome, right upper limb: Secondary | ICD-10-CM | POA: Diagnosis not present

## 2020-02-11 DIAGNOSIS — I5032 Chronic diastolic (congestive) heart failure: Secondary | ICD-10-CM | POA: Diagnosis not present

## 2020-02-11 DIAGNOSIS — H353113 Nonexudative age-related macular degeneration, right eye, advanced atrophic without subfoveal involvement: Secondary | ICD-10-CM | POA: Diagnosis not present

## 2020-02-11 DIAGNOSIS — R5382 Chronic fatigue, unspecified: Secondary | ICD-10-CM | POA: Diagnosis not present

## 2020-02-11 DIAGNOSIS — R748 Abnormal levels of other serum enzymes: Secondary | ICD-10-CM | POA: Diagnosis not present

## 2020-02-11 DIAGNOSIS — F4320 Adjustment disorder, unspecified: Secondary | ICD-10-CM | POA: Diagnosis not present

## 2020-02-11 DIAGNOSIS — I951 Orthostatic hypotension: Secondary | ICD-10-CM | POA: Diagnosis not present

## 2020-02-11 DIAGNOSIS — M778 Other enthesopathies, not elsewhere classified: Secondary | ICD-10-CM | POA: Diagnosis not present

## 2020-02-11 DIAGNOSIS — E662 Morbid (severe) obesity with alveolar hypoventilation: Secondary | ICD-10-CM | POA: Diagnosis not present

## 2020-02-11 DIAGNOSIS — C541 Malignant neoplasm of endometrium: Secondary | ICD-10-CM | POA: Diagnosis not present

## 2020-02-11 DIAGNOSIS — M8589 Other specified disorders of bone density and structure, multiple sites: Secondary | ICD-10-CM | POA: Diagnosis not present

## 2020-02-11 DIAGNOSIS — I480 Paroxysmal atrial fibrillation: Secondary | ICD-10-CM | POA: Diagnosis not present

## 2020-02-11 DIAGNOSIS — M1711 Unilateral primary osteoarthritis, right knee: Secondary | ICD-10-CM | POA: Diagnosis not present

## 2020-02-11 DIAGNOSIS — N3020 Other chronic cystitis without hematuria: Secondary | ICD-10-CM | POA: Diagnosis not present

## 2020-02-11 DIAGNOSIS — C9110 Chronic lymphocytic leukemia of B-cell type not having achieved remission: Secondary | ICD-10-CM | POA: Diagnosis not present

## 2020-02-11 DIAGNOSIS — Z720 Tobacco use: Secondary | ICD-10-CM | POA: Diagnosis not present

## 2020-02-11 DIAGNOSIS — H16143 Punctate keratitis, bilateral: Secondary | ICD-10-CM | POA: Diagnosis not present

## 2020-02-11 DIAGNOSIS — C799 Secondary malignant neoplasm of unspecified site: Secondary | ICD-10-CM | POA: Diagnosis not present

## 2020-02-11 DIAGNOSIS — N319 Neuromuscular dysfunction of bladder, unspecified: Secondary | ICD-10-CM | POA: Diagnosis not present

## 2020-02-11 DIAGNOSIS — E7849 Other hyperlipidemia: Secondary | ICD-10-CM | POA: Diagnosis not present

## 2020-02-11 DIAGNOSIS — N138 Other obstructive and reflux uropathy: Secondary | ICD-10-CM | POA: Diagnosis not present

## 2020-02-11 DIAGNOSIS — I313 Pericardial effusion (noninflammatory): Secondary | ICD-10-CM | POA: Diagnosis not present

## 2020-02-11 DIAGNOSIS — E1169 Type 2 diabetes mellitus with other specified complication: Secondary | ICD-10-CM | POA: Diagnosis not present

## 2020-02-11 DIAGNOSIS — N1832 Chronic kidney disease, stage 3b: Secondary | ICD-10-CM | POA: Diagnosis not present

## 2020-02-11 DIAGNOSIS — G5621 Lesion of ulnar nerve, right upper limb: Secondary | ICD-10-CM | POA: Diagnosis not present

## 2020-02-11 DIAGNOSIS — E041 Nontoxic single thyroid nodule: Secondary | ICD-10-CM | POA: Diagnosis not present

## 2020-02-11 DIAGNOSIS — Z1283 Encounter for screening for malignant neoplasm of skin: Secondary | ICD-10-CM | POA: Diagnosis not present

## 2020-02-11 DIAGNOSIS — I8510 Secondary esophageal varices without bleeding: Secondary | ICD-10-CM | POA: Diagnosis not present

## 2020-02-11 DIAGNOSIS — F17200 Nicotine dependence, unspecified, uncomplicated: Secondary | ICD-10-CM | POA: Diagnosis not present

## 2020-02-11 DIAGNOSIS — I700 Atherosclerosis of aorta: Secondary | ICD-10-CM | POA: Diagnosis not present

## 2020-02-11 DIAGNOSIS — Q263 Partial anomalous pulmonary venous connection: Secondary | ICD-10-CM | POA: Diagnosis not present

## 2020-02-11 DIAGNOSIS — F329 Major depressive disorder, single episode, unspecified: Secondary | ICD-10-CM | POA: Diagnosis not present

## 2020-02-11 DIAGNOSIS — R1030 Lower abdominal pain, unspecified: Secondary | ICD-10-CM | POA: Diagnosis not present

## 2020-02-11 DIAGNOSIS — R7309 Other abnormal glucose: Secondary | ICD-10-CM | POA: Diagnosis not present

## 2020-02-12 DIAGNOSIS — R627 Adult failure to thrive: Secondary | ICD-10-CM | POA: Diagnosis not present

## 2020-02-12 DIAGNOSIS — E063 Autoimmune thyroiditis: Secondary | ICD-10-CM | POA: Diagnosis not present

## 2020-02-12 DIAGNOSIS — E870 Hyperosmolality and hypernatremia: Secondary | ICD-10-CM | POA: Diagnosis not present

## 2020-02-12 DIAGNOSIS — J9622 Acute and chronic respiratory failure with hypercapnia: Secondary | ICD-10-CM | POA: Diagnosis not present

## 2020-02-12 DIAGNOSIS — J440 Chronic obstructive pulmonary disease with acute lower respiratory infection: Secondary | ICD-10-CM | POA: Diagnosis not present

## 2020-02-12 DIAGNOSIS — R2689 Other abnormalities of gait and mobility: Secondary | ICD-10-CM | POA: Diagnosis not present

## 2020-02-12 DIAGNOSIS — Z7901 Long term (current) use of anticoagulants: Secondary | ICD-10-CM | POA: Diagnosis not present

## 2020-02-12 DIAGNOSIS — K9189 Other postprocedural complications and disorders of digestive system: Secondary | ICD-10-CM | POA: Diagnosis not present

## 2020-02-12 DIAGNOSIS — K760 Fatty (change of) liver, not elsewhere classified: Secondary | ICD-10-CM | POA: Diagnosis not present

## 2020-02-12 DIAGNOSIS — I10 Essential (primary) hypertension: Secondary | ICD-10-CM | POA: Diagnosis not present

## 2020-02-12 DIAGNOSIS — R634 Abnormal weight loss: Secondary | ICD-10-CM | POA: Diagnosis not present

## 2020-02-12 DIAGNOSIS — F0391 Unspecified dementia with behavioral disturbance: Secondary | ICD-10-CM | POA: Diagnosis not present

## 2020-02-12 DIAGNOSIS — I951 Orthostatic hypotension: Secondary | ICD-10-CM | POA: Diagnosis not present

## 2020-02-12 DIAGNOSIS — Z6829 Body mass index (BMI) 29.0-29.9, adult: Secondary | ICD-10-CM | POA: Diagnosis not present

## 2020-02-12 DIAGNOSIS — N1832 Chronic kidney disease, stage 3b: Secondary | ICD-10-CM | POA: Diagnosis not present

## 2020-02-12 DIAGNOSIS — J4520 Mild intermittent asthma, uncomplicated: Secondary | ICD-10-CM | POA: Diagnosis not present

## 2020-02-12 DIAGNOSIS — M6250 Muscle wasting and atrophy, not elsewhere classified, unspecified site: Secondary | ICD-10-CM | POA: Diagnosis not present

## 2020-02-12 DIAGNOSIS — K279 Peptic ulcer, site unspecified, unspecified as acute or chronic, without hemorrhage or perforation: Secondary | ICD-10-CM | POA: Diagnosis not present

## 2020-02-12 DIAGNOSIS — E1122 Type 2 diabetes mellitus with diabetic chronic kidney disease: Secondary | ICD-10-CM | POA: Diagnosis not present

## 2020-02-12 DIAGNOSIS — M1990 Unspecified osteoarthritis, unspecified site: Secondary | ICD-10-CM | POA: Diagnosis not present

## 2020-02-12 DIAGNOSIS — F13239 Sedative, hypnotic or anxiolytic dependence with withdrawal, unspecified: Secondary | ICD-10-CM | POA: Diagnosis not present

## 2020-02-12 DIAGNOSIS — F1010 Alcohol abuse, uncomplicated: Secondary | ICD-10-CM | POA: Diagnosis not present

## 2020-02-12 DIAGNOSIS — K219 Gastro-esophageal reflux disease without esophagitis: Secondary | ICD-10-CM | POA: Diagnosis not present

## 2020-02-12 DIAGNOSIS — R778 Other specified abnormalities of plasma proteins: Secondary | ICD-10-CM | POA: Diagnosis not present

## 2020-02-12 DIAGNOSIS — Z992 Dependence on renal dialysis: Secondary | ICD-10-CM | POA: Diagnosis not present

## 2020-02-12 DIAGNOSIS — J80 Acute respiratory distress syndrome: Secondary | ICD-10-CM | POA: Diagnosis not present

## 2020-02-12 DIAGNOSIS — R410 Disorientation, unspecified: Secondary | ICD-10-CM | POA: Diagnosis not present

## 2020-02-12 DIAGNOSIS — D649 Anemia, unspecified: Secondary | ICD-10-CM | POA: Diagnosis not present

## 2020-02-12 DIAGNOSIS — I4811 Longstanding persistent atrial fibrillation: Secondary | ICD-10-CM | POA: Diagnosis not present

## 2020-02-12 DIAGNOSIS — K6389 Other specified diseases of intestine: Secondary | ICD-10-CM | POA: Diagnosis not present

## 2020-02-12 DIAGNOSIS — K648 Other hemorrhoids: Secondary | ICD-10-CM | POA: Diagnosis not present

## 2020-02-12 DIAGNOSIS — N1330 Unspecified hydronephrosis: Secondary | ICD-10-CM | POA: Diagnosis not present

## 2020-02-12 DIAGNOSIS — R4781 Slurred speech: Secondary | ICD-10-CM | POA: Diagnosis not present

## 2020-02-12 DIAGNOSIS — R262 Difficulty in walking, not elsewhere classified: Secondary | ICD-10-CM | POA: Diagnosis not present

## 2020-02-12 DIAGNOSIS — I700 Atherosclerosis of aorta: Secondary | ICD-10-CM | POA: Diagnosis not present

## 2020-02-12 DIAGNOSIS — I2694 Multiple subsegmental pulmonary emboli without acute cor pulmonale: Secondary | ICD-10-CM | POA: Diagnosis not present

## 2020-02-12 DIAGNOSIS — R319 Hematuria, unspecified: Secondary | ICD-10-CM | POA: Diagnosis not present

## 2020-02-12 DIAGNOSIS — N280 Ischemia and infarction of kidney: Secondary | ICD-10-CM | POA: Diagnosis not present

## 2020-02-12 DIAGNOSIS — R1031 Right lower quadrant pain: Secondary | ICD-10-CM | POA: Diagnosis not present

## 2020-02-12 DIAGNOSIS — E43 Unspecified severe protein-calorie malnutrition: Secondary | ICD-10-CM | POA: Diagnosis not present

## 2020-02-12 DIAGNOSIS — J4522 Mild intermittent asthma with status asthmaticus: Secondary | ICD-10-CM | POA: Diagnosis not present

## 2020-02-12 DIAGNOSIS — F410 Panic disorder [episodic paroxysmal anxiety] without agoraphobia: Secondary | ICD-10-CM | POA: Diagnosis not present

## 2020-02-12 DIAGNOSIS — R0602 Shortness of breath: Secondary | ICD-10-CM | POA: Diagnosis not present

## 2020-02-12 DIAGNOSIS — Z91041 Radiographic dye allergy status: Secondary | ICD-10-CM | POA: Diagnosis not present

## 2020-02-12 DIAGNOSIS — I120 Hypertensive chronic kidney disease with stage 5 chronic kidney disease or end stage renal disease: Secondary | ICD-10-CM | POA: Diagnosis not present

## 2020-02-12 DIAGNOSIS — E876 Hypokalemia: Secondary | ICD-10-CM | POA: Diagnosis not present

## 2020-02-12 DIAGNOSIS — R490 Dysphonia: Secondary | ICD-10-CM | POA: Diagnosis not present

## 2020-02-12 DIAGNOSIS — I493 Ventricular premature depolarization: Secondary | ICD-10-CM | POA: Diagnosis not present

## 2020-02-12 DIAGNOSIS — N400 Enlarged prostate without lower urinary tract symptoms: Secondary | ICD-10-CM | POA: Diagnosis not present

## 2020-02-12 DIAGNOSIS — Z8249 Family history of ischemic heart disease and other diseases of the circulatory system: Secondary | ICD-10-CM | POA: Diagnosis not present

## 2020-02-12 DIAGNOSIS — K562 Volvulus: Secondary | ICD-10-CM | POA: Diagnosis not present

## 2020-02-12 DIAGNOSIS — Z87891 Personal history of nicotine dependence: Secondary | ICD-10-CM | POA: Diagnosis not present

## 2020-02-12 DIAGNOSIS — R079 Chest pain, unspecified: Secondary | ICD-10-CM | POA: Diagnosis not present

## 2020-02-12 DIAGNOSIS — J479 Bronchiectasis, uncomplicated: Secondary | ICD-10-CM | POA: Diagnosis not present

## 2020-02-12 DIAGNOSIS — N179 Acute kidney failure, unspecified: Secondary | ICD-10-CM | POA: Diagnosis not present

## 2020-02-12 DIAGNOSIS — J984 Other disorders of lung: Secondary | ICD-10-CM | POA: Diagnosis not present

## 2020-02-12 DIAGNOSIS — E785 Hyperlipidemia, unspecified: Secondary | ICD-10-CM | POA: Diagnosis not present

## 2020-02-12 DIAGNOSIS — J9811 Atelectasis: Secondary | ICD-10-CM | POA: Diagnosis not present

## 2020-02-12 DIAGNOSIS — T8141XA Infection following a procedure, superficial incisional surgical site, initial encounter: Secondary | ICD-10-CM | POA: Diagnosis not present

## 2020-02-12 DIAGNOSIS — R5381 Other malaise: Secondary | ICD-10-CM | POA: Diagnosis not present

## 2020-02-12 DIAGNOSIS — Z888 Allergy status to other drugs, medicaments and biological substances status: Secondary | ICD-10-CM | POA: Diagnosis not present

## 2020-02-12 DIAGNOSIS — G8929 Other chronic pain: Secondary | ICD-10-CM | POA: Diagnosis not present

## 2020-02-12 DIAGNOSIS — J9601 Acute respiratory failure with hypoxia: Secondary | ICD-10-CM | POA: Diagnosis not present

## 2020-02-12 DIAGNOSIS — K55039 Acute (reversible) ischemia of large intestine, extent unspecified: Secondary | ICD-10-CM | POA: Diagnosis not present

## 2020-02-12 DIAGNOSIS — H3530 Unspecified macular degeneration: Secondary | ICD-10-CM | POA: Diagnosis not present

## 2020-02-12 DIAGNOSIS — K592 Neurogenic bowel, not elsewhere classified: Secondary | ICD-10-CM | POA: Diagnosis not present

## 2020-02-12 DIAGNOSIS — K469 Unspecified abdominal hernia without obstruction or gangrene: Secondary | ICD-10-CM | POA: Diagnosis not present

## 2020-02-12 DIAGNOSIS — I499 Cardiac arrhythmia, unspecified: Secondary | ICD-10-CM | POA: Diagnosis not present

## 2020-02-12 DIAGNOSIS — E860 Dehydration: Secondary | ICD-10-CM | POA: Diagnosis not present

## 2020-02-12 DIAGNOSIS — E039 Hypothyroidism, unspecified: Secondary | ICD-10-CM | POA: Diagnosis not present

## 2020-02-12 DIAGNOSIS — R6521 Severe sepsis with septic shock: Secondary | ICD-10-CM | POA: Diagnosis not present

## 2020-02-12 DIAGNOSIS — S72001D Fracture of unspecified part of neck of right femur, subsequent encounter for closed fracture with routine healing: Secondary | ICD-10-CM | POA: Diagnosis not present

## 2020-02-12 DIAGNOSIS — N390 Urinary tract infection, site not specified: Secondary | ICD-10-CM | POA: Diagnosis not present

## 2020-02-12 DIAGNOSIS — Z8673 Personal history of transient ischemic attack (TIA), and cerebral infarction without residual deficits: Secondary | ICD-10-CM | POA: Diagnosis not present

## 2020-02-12 DIAGNOSIS — I471 Supraventricular tachycardia: Secondary | ICD-10-CM | POA: Diagnosis not present

## 2020-02-12 DIAGNOSIS — Z7982 Long term (current) use of aspirin: Secondary | ICD-10-CM | POA: Diagnosis not present

## 2020-02-12 DIAGNOSIS — Z86711 Personal history of pulmonary embolism: Secondary | ICD-10-CM | POA: Diagnosis not present

## 2020-02-12 DIAGNOSIS — H401114 Primary open-angle glaucoma, right eye, indeterminate stage: Secondary | ICD-10-CM | POA: Diagnosis not present

## 2020-02-12 DIAGNOSIS — S0083XA Contusion of other part of head, initial encounter: Secondary | ICD-10-CM | POA: Diagnosis not present

## 2020-02-12 DIAGNOSIS — R2681 Unsteadiness on feet: Secondary | ICD-10-CM | POA: Diagnosis not present

## 2020-02-12 DIAGNOSIS — Z794 Long term (current) use of insulin: Secondary | ICD-10-CM | POA: Diagnosis not present

## 2020-02-12 DIAGNOSIS — R300 Dysuria: Secondary | ICD-10-CM | POA: Diagnosis not present

## 2020-02-12 DIAGNOSIS — R4020 Unspecified coma: Secondary | ICD-10-CM | POA: Diagnosis not present

## 2020-02-12 DIAGNOSIS — Z9981 Dependence on supplemental oxygen: Secondary | ICD-10-CM | POA: Diagnosis not present

## 2020-02-12 DIAGNOSIS — S0512XD Contusion of eyeball and orbital tissues, left eye, subsequent encounter: Secondary | ICD-10-CM | POA: Diagnosis not present

## 2020-02-12 DIAGNOSIS — K900 Celiac disease: Secondary | ICD-10-CM | POA: Diagnosis not present

## 2020-02-12 DIAGNOSIS — G92 Toxic encephalopathy: Secondary | ICD-10-CM | POA: Diagnosis not present

## 2020-02-12 DIAGNOSIS — I248 Other forms of acute ischemic heart disease: Secondary | ICD-10-CM | POA: Diagnosis not present

## 2020-02-12 DIAGNOSIS — E7800 Pure hypercholesterolemia, unspecified: Secondary | ICD-10-CM | POA: Diagnosis not present

## 2020-02-12 DIAGNOSIS — M6282 Rhabdomyolysis: Secondary | ICD-10-CM | POA: Diagnosis not present

## 2020-02-12 DIAGNOSIS — F411 Generalized anxiety disorder: Secondary | ICD-10-CM | POA: Diagnosis not present

## 2020-02-12 DIAGNOSIS — Z4731 Aftercare following explantation of shoulder joint prosthesis: Secondary | ICD-10-CM | POA: Diagnosis not present

## 2020-02-12 DIAGNOSIS — E1165 Type 2 diabetes mellitus with hyperglycemia: Secondary | ICD-10-CM | POA: Diagnosis not present

## 2020-02-12 DIAGNOSIS — L89152 Pressure ulcer of sacral region, stage 2: Secondary | ICD-10-CM | POA: Diagnosis not present

## 2020-02-12 DIAGNOSIS — F17200 Nicotine dependence, unspecified, uncomplicated: Secondary | ICD-10-CM | POA: Diagnosis not present

## 2020-02-12 DIAGNOSIS — D638 Anemia in other chronic diseases classified elsewhere: Secondary | ICD-10-CM | POA: Diagnosis not present

## 2020-02-12 DIAGNOSIS — D61818 Other pancytopenia: Secondary | ICD-10-CM | POA: Diagnosis not present

## 2020-02-12 DIAGNOSIS — G9341 Metabolic encephalopathy: Secondary | ICD-10-CM | POA: Diagnosis not present

## 2020-02-12 DIAGNOSIS — R278 Other lack of coordination: Secondary | ICD-10-CM | POA: Diagnosis not present

## 2020-02-12 DIAGNOSIS — W19XXXD Unspecified fall, subsequent encounter: Secondary | ICD-10-CM | POA: Diagnosis not present

## 2020-02-12 DIAGNOSIS — N926 Irregular menstruation, unspecified: Secondary | ICD-10-CM | POA: Diagnosis not present

## 2020-02-12 DIAGNOSIS — R0902 Hypoxemia: Secondary | ICD-10-CM | POA: Diagnosis not present

## 2020-02-12 DIAGNOSIS — A419 Sepsis, unspecified organism: Secondary | ICD-10-CM | POA: Diagnosis not present

## 2020-02-12 DIAGNOSIS — J189 Pneumonia, unspecified organism: Secondary | ICD-10-CM | POA: Diagnosis not present

## 2020-02-12 DIAGNOSIS — G4730 Sleep apnea, unspecified: Secondary | ICD-10-CM | POA: Diagnosis not present

## 2020-02-12 DIAGNOSIS — R159 Full incontinence of feces: Secondary | ICD-10-CM | POA: Diagnosis not present

## 2020-02-12 DIAGNOSIS — N319 Neuromuscular dysfunction of bladder, unspecified: Secondary | ICD-10-CM | POA: Diagnosis not present

## 2020-02-12 DIAGNOSIS — I5020 Unspecified systolic (congestive) heart failure: Secondary | ICD-10-CM | POA: Diagnosis not present

## 2020-02-12 DIAGNOSIS — Z9181 History of falling: Secondary | ICD-10-CM | POA: Diagnosis not present

## 2020-02-12 DIAGNOSIS — R0689 Other abnormalities of breathing: Secondary | ICD-10-CM | POA: Diagnosis not present

## 2020-02-12 DIAGNOSIS — Z7902 Long term (current) use of antithrombotics/antiplatelets: Secondary | ICD-10-CM | POA: Diagnosis not present

## 2020-02-12 DIAGNOSIS — S2241XD Multiple fractures of ribs, right side, subsequent encounter for fracture with routine healing: Secondary | ICD-10-CM | POA: Diagnosis not present

## 2020-02-12 DIAGNOSIS — Z20822 Contact with and (suspected) exposure to covid-19: Secondary | ICD-10-CM | POA: Diagnosis not present

## 2020-02-12 DIAGNOSIS — M8000XD Age-related osteoporosis with current pathological fracture, unspecified site, subsequent encounter for fracture with routine healing: Secondary | ICD-10-CM | POA: Diagnosis not present

## 2020-02-12 DIAGNOSIS — K56609 Unspecified intestinal obstruction, unspecified as to partial versus complete obstruction: Secondary | ICD-10-CM | POA: Diagnosis not present

## 2020-02-12 DIAGNOSIS — S199XXA Unspecified injury of neck, initial encounter: Secondary | ICD-10-CM | POA: Diagnosis not present

## 2020-02-12 DIAGNOSIS — R188 Other ascites: Secondary | ICD-10-CM | POA: Diagnosis not present

## 2020-02-12 DIAGNOSIS — J449 Chronic obstructive pulmonary disease, unspecified: Secondary | ICD-10-CM | POA: Diagnosis not present

## 2020-02-12 DIAGNOSIS — E440 Moderate protein-calorie malnutrition: Secondary | ICD-10-CM | POA: Diagnosis not present

## 2020-02-12 DIAGNOSIS — R52 Pain, unspecified: Secondary | ICD-10-CM | POA: Diagnosis not present

## 2020-02-12 DIAGNOSIS — R197 Diarrhea, unspecified: Secondary | ICD-10-CM | POA: Diagnosis not present

## 2020-02-12 DIAGNOSIS — N201 Calculus of ureter: Secondary | ICD-10-CM | POA: Diagnosis not present

## 2020-02-12 DIAGNOSIS — Z880 Allergy status to penicillin: Secondary | ICD-10-CM | POA: Diagnosis not present

## 2020-02-12 DIAGNOSIS — R7989 Other specified abnormal findings of blood chemistry: Secondary | ICD-10-CM | POA: Diagnosis not present

## 2020-02-12 DIAGNOSIS — R339 Retention of urine, unspecified: Secondary | ICD-10-CM | POA: Diagnosis not present

## 2020-02-12 DIAGNOSIS — R350 Frequency of micturition: Secondary | ICD-10-CM | POA: Diagnosis not present

## 2020-02-12 DIAGNOSIS — J309 Allergic rhinitis, unspecified: Secondary | ICD-10-CM | POA: Diagnosis not present

## 2020-02-12 DIAGNOSIS — K920 Hematemesis: Secondary | ICD-10-CM | POA: Diagnosis not present

## 2020-02-12 DIAGNOSIS — S0003XA Contusion of scalp, initial encounter: Secondary | ICD-10-CM | POA: Diagnosis not present

## 2020-02-12 DIAGNOSIS — N170 Acute kidney failure with tubular necrosis: Secondary | ICD-10-CM | POA: Diagnosis not present

## 2020-02-12 DIAGNOSIS — F325 Major depressive disorder, single episode, in full remission: Secondary | ICD-10-CM | POA: Diagnosis not present

## 2020-02-12 DIAGNOSIS — R61 Generalized hyperhidrosis: Secondary | ICD-10-CM | POA: Diagnosis not present

## 2020-02-12 DIAGNOSIS — G894 Chronic pain syndrome: Secondary | ICD-10-CM | POA: Diagnosis not present

## 2020-02-12 DIAGNOSIS — I5033 Acute on chronic diastolic (congestive) heart failure: Secondary | ICD-10-CM | POA: Diagnosis not present

## 2020-02-12 DIAGNOSIS — R55 Syncope and collapse: Secondary | ICD-10-CM | POA: Diagnosis not present

## 2020-02-12 DIAGNOSIS — S0101XD Laceration without foreign body of scalp, subsequent encounter: Secondary | ICD-10-CM | POA: Diagnosis not present

## 2020-02-12 DIAGNOSIS — K5651 Intestinal adhesions [bands], with partial obstruction: Secondary | ICD-10-CM | POA: Diagnosis not present

## 2020-02-12 DIAGNOSIS — C4321 Malignant melanoma of right ear and external auricular canal: Secondary | ICD-10-CM | POA: Diagnosis not present

## 2020-02-12 DIAGNOSIS — H401124 Primary open-angle glaucoma, left eye, indeterminate stage: Secondary | ICD-10-CM | POA: Diagnosis not present

## 2020-02-12 DIAGNOSIS — Z720 Tobacco use: Secondary | ICD-10-CM | POA: Diagnosis not present

## 2020-02-12 DIAGNOSIS — R296 Repeated falls: Secondary | ICD-10-CM | POA: Diagnosis not present

## 2020-02-12 DIAGNOSIS — Z800 Family history of malignant neoplasm of digestive organs: Secondary | ICD-10-CM | POA: Diagnosis not present

## 2020-02-12 DIAGNOSIS — R1084 Generalized abdominal pain: Secondary | ICD-10-CM | POA: Diagnosis not present

## 2020-02-12 DIAGNOSIS — I5042 Chronic combined systolic (congestive) and diastolic (congestive) heart failure: Secondary | ICD-10-CM | POA: Diagnosis not present

## 2020-02-12 DIAGNOSIS — J90 Pleural effusion, not elsewhere classified: Secondary | ICD-10-CM | POA: Diagnosis not present

## 2020-02-12 DIAGNOSIS — J302 Other seasonal allergic rhinitis: Secondary | ICD-10-CM | POA: Diagnosis not present

## 2020-02-12 DIAGNOSIS — Z8701 Personal history of pneumonia (recurrent): Secondary | ICD-10-CM | POA: Diagnosis not present

## 2020-02-12 DIAGNOSIS — I110 Hypertensive heart disease with heart failure: Secondary | ICD-10-CM | POA: Diagnosis not present

## 2020-02-12 DIAGNOSIS — J9611 Chronic respiratory failure with hypoxia: Secondary | ICD-10-CM | POA: Diagnosis not present

## 2020-02-12 DIAGNOSIS — R140 Abdominal distension (gaseous): Secondary | ICD-10-CM | POA: Diagnosis not present

## 2020-02-12 DIAGNOSIS — F05 Delirium due to known physiological condition: Secondary | ICD-10-CM | POA: Diagnosis not present

## 2020-02-12 DIAGNOSIS — H4612 Retrobulbar neuritis, left eye: Secondary | ICD-10-CM | POA: Diagnosis not present

## 2020-02-12 DIAGNOSIS — K5900 Constipation, unspecified: Secondary | ICD-10-CM | POA: Diagnosis not present

## 2020-02-12 DIAGNOSIS — I5023 Acute on chronic systolic (congestive) heart failure: Secondary | ICD-10-CM | POA: Diagnosis not present

## 2020-02-12 DIAGNOSIS — E871 Hypo-osmolality and hyponatremia: Secondary | ICD-10-CM | POA: Diagnosis not present

## 2020-02-12 DIAGNOSIS — N186 End stage renal disease: Secondary | ICD-10-CM | POA: Diagnosis not present

## 2020-02-12 DIAGNOSIS — J690 Pneumonitis due to inhalation of food and vomit: Secondary | ICD-10-CM | POA: Diagnosis not present

## 2020-02-12 DIAGNOSIS — G4736 Sleep related hypoventilation in conditions classified elsewhere: Secondary | ICD-10-CM | POA: Diagnosis not present

## 2020-02-12 DIAGNOSIS — Z79899 Other long term (current) drug therapy: Secondary | ICD-10-CM | POA: Diagnosis not present

## 2020-02-12 DIAGNOSIS — Z8546 Personal history of malignant neoplasm of prostate: Secondary | ICD-10-CM | POA: Diagnosis not present

## 2020-02-12 DIAGNOSIS — S0990XA Unspecified injury of head, initial encounter: Secondary | ICD-10-CM | POA: Diagnosis not present

## 2020-02-12 DIAGNOSIS — F419 Anxiety disorder, unspecified: Secondary | ICD-10-CM | POA: Diagnosis not present

## 2020-02-12 DIAGNOSIS — G4733 Obstructive sleep apnea (adult) (pediatric): Secondary | ICD-10-CM | POA: Diagnosis not present

## 2020-02-12 DIAGNOSIS — S81012A Laceration without foreign body, left knee, initial encounter: Secondary | ICD-10-CM | POA: Diagnosis not present

## 2020-02-12 DIAGNOSIS — K567 Ileus, unspecified: Secondary | ICD-10-CM | POA: Diagnosis not present

## 2020-02-12 DIAGNOSIS — Z792 Long term (current) use of antibiotics: Secondary | ICD-10-CM | POA: Diagnosis not present

## 2020-02-12 DIAGNOSIS — Z01812 Encounter for preprocedural laboratory examination: Secondary | ICD-10-CM | POA: Diagnosis not present

## 2020-02-12 DIAGNOSIS — F332 Major depressive disorder, recurrent severe without psychotic features: Secondary | ICD-10-CM | POA: Diagnosis not present

## 2020-02-12 DIAGNOSIS — K2970 Gastritis, unspecified, without bleeding: Secondary | ICD-10-CM | POA: Diagnosis not present

## 2020-02-12 DIAGNOSIS — A0472 Enterocolitis due to Clostridium difficile, not specified as recurrent: Secondary | ICD-10-CM | POA: Diagnosis not present

## 2020-02-12 DIAGNOSIS — F1020 Alcohol dependence, uncomplicated: Secondary | ICD-10-CM | POA: Diagnosis not present

## 2020-02-12 DIAGNOSIS — J9621 Acute and chronic respiratory failure with hypoxia: Secondary | ICD-10-CM | POA: Diagnosis not present

## 2020-02-12 DIAGNOSIS — R41841 Cognitive communication deficit: Secondary | ICD-10-CM | POA: Diagnosis not present

## 2020-02-12 DIAGNOSIS — J441 Chronic obstructive pulmonary disease with (acute) exacerbation: Secondary | ICD-10-CM | POA: Diagnosis not present

## 2020-02-12 DIAGNOSIS — K559 Vascular disorder of intestine, unspecified: Secondary | ICD-10-CM | POA: Diagnosis not present

## 2020-02-12 DIAGNOSIS — I350 Nonrheumatic aortic (valve) stenosis: Secondary | ICD-10-CM | POA: Diagnosis not present

## 2020-02-12 DIAGNOSIS — R0789 Other chest pain: Secondary | ICD-10-CM | POA: Diagnosis not present

## 2020-02-12 DIAGNOSIS — M6281 Muscle weakness (generalized): Secondary | ICD-10-CM | POA: Diagnosis not present

## 2020-02-12 DIAGNOSIS — E669 Obesity, unspecified: Secondary | ICD-10-CM | POA: Diagnosis not present

## 2020-02-13 DIAGNOSIS — Z20822 Contact with and (suspected) exposure to covid-19: Secondary | ICD-10-CM | POA: Diagnosis not present

## 2020-02-13 DIAGNOSIS — J984 Other disorders of lung: Secondary | ICD-10-CM | POA: Diagnosis not present

## 2020-02-13 DIAGNOSIS — G4733 Obstructive sleep apnea (adult) (pediatric): Secondary | ICD-10-CM | POA: Diagnosis not present

## 2020-02-13 DIAGNOSIS — J811 Chronic pulmonary edema: Secondary | ICD-10-CM | POA: Diagnosis not present

## 2020-02-13 DIAGNOSIS — R079 Chest pain, unspecified: Secondary | ICD-10-CM | POA: Diagnosis not present

## 2020-02-13 DIAGNOSIS — F419 Anxiety disorder, unspecified: Secondary | ICD-10-CM | POA: Diagnosis not present

## 2020-02-13 DIAGNOSIS — R0600 Dyspnea, unspecified: Secondary | ICD-10-CM | POA: Diagnosis not present

## 2020-02-13 DIAGNOSIS — Z886 Allergy status to analgesic agent status: Secondary | ICD-10-CM | POA: Diagnosis not present

## 2020-02-13 DIAGNOSIS — F39 Unspecified mood [affective] disorder: Secondary | ICD-10-CM | POA: Diagnosis not present

## 2020-02-13 DIAGNOSIS — G8929 Other chronic pain: Secondary | ICD-10-CM | POA: Diagnosis not present

## 2020-02-13 DIAGNOSIS — R9431 Abnormal electrocardiogram [ECG] [EKG]: Secondary | ICD-10-CM | POA: Diagnosis not present

## 2020-02-13 DIAGNOSIS — M5416 Radiculopathy, lumbar region: Secondary | ICD-10-CM | POA: Diagnosis not present

## 2020-02-13 DIAGNOSIS — R519 Headache, unspecified: Secondary | ICD-10-CM | POA: Diagnosis not present

## 2020-02-13 DIAGNOSIS — K5790 Diverticulosis of intestine, part unspecified, without perforation or abscess without bleeding: Secondary | ICD-10-CM | POA: Diagnosis not present

## 2020-02-13 DIAGNOSIS — I2510 Atherosclerotic heart disease of native coronary artery without angina pectoris: Secondary | ICD-10-CM | POA: Diagnosis not present

## 2020-02-13 DIAGNOSIS — R6250 Unspecified lack of expected normal physiological development in childhood: Secondary | ICD-10-CM | POA: Diagnosis not present

## 2020-02-13 DIAGNOSIS — G510 Bell's palsy: Secondary | ICD-10-CM | POA: Diagnosis not present

## 2020-02-13 DIAGNOSIS — B9620 Unspecified Escherichia coli [E. coli] as the cause of diseases classified elsewhere: Secondary | ICD-10-CM | POA: Diagnosis not present

## 2020-02-13 DIAGNOSIS — Z955 Presence of coronary angioplasty implant and graft: Secondary | ICD-10-CM | POA: Diagnosis not present

## 2020-02-13 DIAGNOSIS — J8410 Pulmonary fibrosis, unspecified: Secondary | ICD-10-CM | POA: Diagnosis not present

## 2020-02-13 DIAGNOSIS — Z79899 Other long term (current) drug therapy: Secondary | ICD-10-CM | POA: Diagnosis not present

## 2020-02-13 DIAGNOSIS — M1990 Unspecified osteoarthritis, unspecified site: Secondary | ICD-10-CM | POA: Diagnosis not present

## 2020-02-13 DIAGNOSIS — A419 Sepsis, unspecified organism: Secondary | ICD-10-CM | POA: Diagnosis not present

## 2020-02-13 DIAGNOSIS — C679 Malignant neoplasm of bladder, unspecified: Secondary | ICD-10-CM | POA: Diagnosis not present

## 2020-02-13 DIAGNOSIS — E118 Type 2 diabetes mellitus with unspecified complications: Secondary | ICD-10-CM | POA: Diagnosis not present

## 2020-02-13 DIAGNOSIS — R55 Syncope and collapse: Secondary | ICD-10-CM | POA: Diagnosis not present

## 2020-02-13 DIAGNOSIS — M4800 Spinal stenosis, site unspecified: Secondary | ICD-10-CM | POA: Diagnosis not present

## 2020-02-13 DIAGNOSIS — G250 Essential tremor: Secondary | ICD-10-CM | POA: Diagnosis not present

## 2020-02-13 DIAGNOSIS — Z6833 Body mass index (BMI) 33.0-33.9, adult: Secondary | ICD-10-CM | POA: Diagnosis not present

## 2020-02-13 DIAGNOSIS — R531 Weakness: Secondary | ICD-10-CM | POA: Diagnosis not present

## 2020-02-13 DIAGNOSIS — Z85841 Personal history of malignant neoplasm of brain: Secondary | ICD-10-CM | POA: Diagnosis not present

## 2020-02-13 DIAGNOSIS — T8450XA Infection and inflammatory reaction due to unspecified internal joint prosthesis, initial encounter: Secondary | ICD-10-CM | POA: Diagnosis not present

## 2020-02-13 DIAGNOSIS — I639 Cerebral infarction, unspecified: Secondary | ICD-10-CM | POA: Diagnosis not present

## 2020-02-13 DIAGNOSIS — N39498 Other specified urinary incontinence: Secondary | ICD-10-CM | POA: Diagnosis not present

## 2020-02-13 DIAGNOSIS — I4510 Unspecified right bundle-branch block: Secondary | ICD-10-CM | POA: Diagnosis not present

## 2020-02-13 DIAGNOSIS — E876 Hypokalemia: Secondary | ICD-10-CM | POA: Diagnosis not present

## 2020-02-13 DIAGNOSIS — E871 Hypo-osmolality and hyponatremia: Secondary | ICD-10-CM | POA: Diagnosis not present

## 2020-02-13 DIAGNOSIS — E669 Obesity, unspecified: Secondary | ICD-10-CM | POA: Diagnosis not present

## 2020-02-13 DIAGNOSIS — N179 Acute kidney failure, unspecified: Secondary | ICD-10-CM | POA: Diagnosis not present

## 2020-02-13 DIAGNOSIS — I480 Paroxysmal atrial fibrillation: Secondary | ICD-10-CM | POA: Diagnosis not present

## 2020-02-13 DIAGNOSIS — U071 COVID-19: Secondary | ICD-10-CM | POA: Diagnosis not present

## 2020-02-13 DIAGNOSIS — K219 Gastro-esophageal reflux disease without esophagitis: Secondary | ICD-10-CM | POA: Diagnosis not present

## 2020-02-13 DIAGNOSIS — D132 Benign neoplasm of duodenum: Secondary | ICD-10-CM | POA: Diagnosis not present

## 2020-02-13 DIAGNOSIS — M7989 Other specified soft tissue disorders: Secondary | ICD-10-CM | POA: Diagnosis not present

## 2020-02-13 DIAGNOSIS — M159 Polyosteoarthritis, unspecified: Secondary | ICD-10-CM | POA: Diagnosis not present

## 2020-02-13 DIAGNOSIS — Z923 Personal history of irradiation: Secondary | ICD-10-CM | POA: Diagnosis not present

## 2020-02-13 DIAGNOSIS — E11319 Type 2 diabetes mellitus with unspecified diabetic retinopathy without macular edema: Secondary | ICD-10-CM | POA: Diagnosis not present

## 2020-02-13 DIAGNOSIS — Z880 Allergy status to penicillin: Secondary | ICD-10-CM | POA: Diagnosis not present

## 2020-02-13 DIAGNOSIS — I130 Hypertensive heart and chronic kidney disease with heart failure and stage 1 through stage 4 chronic kidney disease, or unspecified chronic kidney disease: Secondary | ICD-10-CM | POA: Diagnosis not present

## 2020-02-13 DIAGNOSIS — G459 Transient cerebral ischemic attack, unspecified: Secondary | ICD-10-CM | POA: Diagnosis not present

## 2020-02-13 DIAGNOSIS — K259 Gastric ulcer, unspecified as acute or chronic, without hemorrhage or perforation: Secondary | ICD-10-CM | POA: Diagnosis not present

## 2020-02-13 DIAGNOSIS — R0902 Hypoxemia: Secondary | ICD-10-CM | POA: Diagnosis not present

## 2020-02-13 DIAGNOSIS — Z982 Presence of cerebrospinal fluid drainage device: Secondary | ICD-10-CM | POA: Diagnosis not present

## 2020-02-13 DIAGNOSIS — R3915 Urgency of urination: Secondary | ICD-10-CM | POA: Diagnosis not present

## 2020-02-13 DIAGNOSIS — I10 Essential (primary) hypertension: Secondary | ICD-10-CM | POA: Diagnosis not present

## 2020-02-13 DIAGNOSIS — Z8673 Personal history of transient ischemic attack (TIA), and cerebral infarction without residual deficits: Secondary | ICD-10-CM | POA: Diagnosis not present

## 2020-02-13 DIAGNOSIS — M109 Gout, unspecified: Secondary | ICD-10-CM | POA: Diagnosis not present

## 2020-02-13 DIAGNOSIS — I4891 Unspecified atrial fibrillation: Secondary | ICD-10-CM | POA: Diagnosis not present

## 2020-02-13 DIAGNOSIS — D1330 Benign neoplasm of unspecified part of small intestine: Secondary | ICD-10-CM | POA: Diagnosis not present

## 2020-02-13 DIAGNOSIS — R627 Adult failure to thrive: Secondary | ICD-10-CM | POA: Diagnosis not present

## 2020-02-13 DIAGNOSIS — I447 Left bundle-branch block, unspecified: Secondary | ICD-10-CM | POA: Diagnosis not present

## 2020-02-13 DIAGNOSIS — M25551 Pain in right hip: Secondary | ICD-10-CM | POA: Diagnosis not present

## 2020-02-13 DIAGNOSIS — Z951 Presence of aortocoronary bypass graft: Secondary | ICD-10-CM | POA: Diagnosis not present

## 2020-02-13 DIAGNOSIS — E1122 Type 2 diabetes mellitus with diabetic chronic kidney disease: Secondary | ICD-10-CM | POA: Diagnosis not present

## 2020-02-13 DIAGNOSIS — M00011 Staphylococcal arthritis, right shoulder: Secondary | ICD-10-CM | POA: Diagnosis not present

## 2020-02-13 DIAGNOSIS — Z86011 Personal history of benign neoplasm of the brain: Secondary | ICD-10-CM | POA: Diagnosis not present

## 2020-02-13 DIAGNOSIS — D696 Thrombocytopenia, unspecified: Secondary | ICD-10-CM | POA: Diagnosis not present

## 2020-02-13 DIAGNOSIS — G40209 Localization-related (focal) (partial) symptomatic epilepsy and epileptic syndromes with complex partial seizures, not intractable, without status epilepticus: Secondary | ICD-10-CM | POA: Diagnosis not present

## 2020-02-13 DIAGNOSIS — N200 Calculus of kidney: Secondary | ICD-10-CM | POA: Diagnosis not present

## 2020-02-13 DIAGNOSIS — Z6841 Body Mass Index (BMI) 40.0 and over, adult: Secondary | ICD-10-CM | POA: Diagnosis not present

## 2020-02-13 DIAGNOSIS — K660 Peritoneal adhesions (postprocedural) (postinfection): Secondary | ICD-10-CM | POA: Diagnosis not present

## 2020-02-13 DIAGNOSIS — Z87891 Personal history of nicotine dependence: Secondary | ICD-10-CM | POA: Diagnosis not present

## 2020-02-13 DIAGNOSIS — C189 Malignant neoplasm of colon, unspecified: Secondary | ICD-10-CM | POA: Diagnosis not present

## 2020-02-13 DIAGNOSIS — D510 Vitamin B12 deficiency anemia due to intrinsic factor deficiency: Secondary | ICD-10-CM | POA: Diagnosis not present

## 2020-02-13 DIAGNOSIS — N182 Chronic kidney disease, stage 2 (mild): Secondary | ICD-10-CM | POA: Diagnosis not present

## 2020-02-13 DIAGNOSIS — R000 Tachycardia, unspecified: Secondary | ICD-10-CM | POA: Diagnosis not present

## 2020-02-13 DIAGNOSIS — R197 Diarrhea, unspecified: Secondary | ICD-10-CM | POA: Diagnosis not present

## 2020-02-13 DIAGNOSIS — Z20828 Contact with and (suspected) exposure to other viral communicable diseases: Secondary | ICD-10-CM | POA: Diagnosis not present

## 2020-02-13 DIAGNOSIS — I482 Chronic atrial fibrillation, unspecified: Secondary | ICD-10-CM | POA: Diagnosis not present

## 2020-02-13 DIAGNOSIS — E785 Hyperlipidemia, unspecified: Secondary | ICD-10-CM | POA: Diagnosis not present

## 2020-02-13 DIAGNOSIS — K562 Volvulus: Secondary | ICD-10-CM | POA: Diagnosis not present

## 2020-02-13 DIAGNOSIS — J9601 Acute respiratory failure with hypoxia: Secondary | ICD-10-CM | POA: Diagnosis not present

## 2020-02-13 DIAGNOSIS — K7460 Unspecified cirrhosis of liver: Secondary | ICD-10-CM | POA: Diagnosis not present

## 2020-02-13 DIAGNOSIS — I5042 Chronic combined systolic (congestive) and diastolic (congestive) heart failure: Secondary | ICD-10-CM | POA: Diagnosis not present

## 2020-02-13 DIAGNOSIS — N183 Chronic kidney disease, stage 3 unspecified: Secondary | ICD-10-CM | POA: Diagnosis not present

## 2020-02-13 DIAGNOSIS — S0990XA Unspecified injury of head, initial encounter: Secondary | ICD-10-CM | POA: Diagnosis not present

## 2020-02-13 DIAGNOSIS — R0602 Shortness of breath: Secondary | ICD-10-CM | POA: Diagnosis not present

## 2020-02-13 DIAGNOSIS — Z888 Allergy status to other drugs, medicaments and biological substances status: Secondary | ICD-10-CM | POA: Diagnosis not present

## 2020-02-13 DIAGNOSIS — K3189 Other diseases of stomach and duodenum: Secondary | ICD-10-CM | POA: Diagnosis not present

## 2020-02-13 DIAGNOSIS — R609 Edema, unspecified: Secondary | ICD-10-CM | POA: Diagnosis not present

## 2020-02-13 DIAGNOSIS — Z794 Long term (current) use of insulin: Secondary | ICD-10-CM | POA: Diagnosis not present

## 2020-02-13 DIAGNOSIS — K279 Peptic ulcer, site unspecified, unspecified as acute or chronic, without hemorrhage or perforation: Secondary | ICD-10-CM | POA: Diagnosis not present

## 2020-02-13 DIAGNOSIS — I110 Hypertensive heart disease with heart failure: Secondary | ICD-10-CM | POA: Diagnosis not present

## 2020-02-13 DIAGNOSIS — R338 Other retention of urine: Secondary | ICD-10-CM | POA: Diagnosis not present

## 2020-02-13 DIAGNOSIS — K449 Diaphragmatic hernia without obstruction or gangrene: Secondary | ICD-10-CM | POA: Diagnosis not present

## 2020-02-13 DIAGNOSIS — R112 Nausea with vomiting, unspecified: Secondary | ICD-10-CM | POA: Diagnosis not present

## 2020-02-13 DIAGNOSIS — Z23 Encounter for immunization: Secondary | ICD-10-CM | POA: Diagnosis not present

## 2020-02-13 DIAGNOSIS — N401 Enlarged prostate with lower urinary tract symptoms: Secondary | ICD-10-CM | POA: Diagnosis not present

## 2020-02-13 DIAGNOSIS — D72829 Elevated white blood cell count, unspecified: Secondary | ICD-10-CM | POA: Diagnosis not present

## 2020-02-13 DIAGNOSIS — M25552 Pain in left hip: Secondary | ICD-10-CM | POA: Diagnosis not present

## 2020-02-13 DIAGNOSIS — Z9889 Other specified postprocedural states: Secondary | ICD-10-CM | POA: Diagnosis not present

## 2020-02-13 DIAGNOSIS — D509 Iron deficiency anemia, unspecified: Secondary | ICD-10-CM | POA: Diagnosis not present

## 2020-02-13 DIAGNOSIS — L89151 Pressure ulcer of sacral region, stage 1: Secondary | ICD-10-CM | POA: Diagnosis not present

## 2020-02-13 DIAGNOSIS — I5032 Chronic diastolic (congestive) heart failure: Secondary | ICD-10-CM | POA: Diagnosis not present

## 2020-02-13 DIAGNOSIS — Z952 Presence of prosthetic heart valve: Secondary | ICD-10-CM | POA: Diagnosis not present

## 2020-02-13 DIAGNOSIS — R5383 Other fatigue: Secondary | ICD-10-CM | POA: Diagnosis not present

## 2020-02-13 DIAGNOSIS — R001 Bradycardia, unspecified: Secondary | ICD-10-CM | POA: Diagnosis not present

## 2020-02-13 DIAGNOSIS — R110 Nausea: Secondary | ICD-10-CM | POA: Diagnosis not present

## 2020-02-13 DIAGNOSIS — E1142 Type 2 diabetes mellitus with diabetic polyneuropathy: Secondary | ICD-10-CM | POA: Diagnosis not present

## 2020-02-13 DIAGNOSIS — F0280 Dementia in other diseases classified elsewhere without behavioral disturbance: Secondary | ICD-10-CM | POA: Diagnosis not present

## 2020-02-13 DIAGNOSIS — Z885 Allergy status to narcotic agent status: Secondary | ICD-10-CM | POA: Diagnosis not present

## 2020-02-13 DIAGNOSIS — Z4659 Encounter for fitting and adjustment of other gastrointestinal appliance and device: Secondary | ICD-10-CM | POA: Diagnosis not present

## 2020-02-13 DIAGNOSIS — E039 Hypothyroidism, unspecified: Secondary | ICD-10-CM | POA: Diagnosis not present

## 2020-02-13 DIAGNOSIS — R339 Retention of urine, unspecified: Secondary | ICD-10-CM | POA: Diagnosis not present

## 2020-02-13 DIAGNOSIS — I440 Atrioventricular block, first degree: Secondary | ICD-10-CM | POA: Diagnosis not present

## 2020-02-13 DIAGNOSIS — T8501XA Breakdown (mechanical) of ventricular intracranial (communicating) shunt, initial encounter: Secondary | ICD-10-CM | POA: Diagnosis not present

## 2020-02-13 DIAGNOSIS — J441 Chronic obstructive pulmonary disease with (acute) exacerbation: Secondary | ICD-10-CM | POA: Diagnosis not present

## 2020-02-13 DIAGNOSIS — D649 Anemia, unspecified: Secondary | ICD-10-CM | POA: Diagnosis not present

## 2020-02-13 DIAGNOSIS — Z79890 Hormone replacement therapy (postmenopausal): Secondary | ICD-10-CM | POA: Diagnosis not present

## 2020-02-13 DIAGNOSIS — I739 Peripheral vascular disease, unspecified: Secondary | ICD-10-CM | POA: Diagnosis not present

## 2020-02-13 DIAGNOSIS — E782 Mixed hyperlipidemia: Secondary | ICD-10-CM | POA: Diagnosis not present

## 2020-02-13 DIAGNOSIS — G9341 Metabolic encephalopathy: Secondary | ICD-10-CM | POA: Diagnosis not present

## 2020-02-13 DIAGNOSIS — E890 Postprocedural hypothyroidism: Secondary | ICD-10-CM | POA: Diagnosis not present

## 2020-02-13 DIAGNOSIS — Z91040 Latex allergy status: Secondary | ICD-10-CM | POA: Diagnosis not present

## 2020-02-13 DIAGNOSIS — I420 Dilated cardiomyopathy: Secondary | ICD-10-CM | POA: Diagnosis not present

## 2020-02-13 DIAGNOSIS — I129 Hypertensive chronic kidney disease with stage 1 through stage 4 chronic kidney disease, or unspecified chronic kidney disease: Secondary | ICD-10-CM | POA: Diagnosis not present

## 2020-02-13 DIAGNOSIS — R4781 Slurred speech: Secondary | ICD-10-CM | POA: Diagnosis not present

## 2020-02-13 DIAGNOSIS — J432 Centrilobular emphysema: Secondary | ICD-10-CM | POA: Diagnosis not present

## 2020-02-13 DIAGNOSIS — R0789 Other chest pain: Secondary | ICD-10-CM | POA: Diagnosis not present

## 2020-02-13 DIAGNOSIS — S199XXA Unspecified injury of neck, initial encounter: Secondary | ICD-10-CM | POA: Diagnosis not present

## 2020-02-13 DIAGNOSIS — Z03818 Encounter for observation for suspected exposure to other biological agents ruled out: Secondary | ICD-10-CM | POA: Diagnosis not present

## 2020-02-13 DIAGNOSIS — E119 Type 2 diabetes mellitus without complications: Secondary | ICD-10-CM | POA: Diagnosis not present

## 2020-02-13 DIAGNOSIS — E0500 Thyrotoxicosis with diffuse goiter without thyrotoxic crisis or storm: Secondary | ICD-10-CM | POA: Diagnosis not present

## 2020-02-13 DIAGNOSIS — K7581 Nonalcoholic steatohepatitis (NASH): Secondary | ICD-10-CM | POA: Diagnosis not present

## 2020-02-13 DIAGNOSIS — J45909 Unspecified asthma, uncomplicated: Secondary | ICD-10-CM | POA: Diagnosis not present

## 2020-02-13 DIAGNOSIS — I6782 Cerebral ischemia: Secondary | ICD-10-CM | POA: Diagnosis not present

## 2020-02-13 DIAGNOSIS — Z950 Presence of cardiac pacemaker: Secondary | ICD-10-CM | POA: Diagnosis not present

## 2020-02-13 DIAGNOSIS — M545 Low back pain: Secondary | ICD-10-CM | POA: Diagnosis not present

## 2020-02-13 DIAGNOSIS — S42031A Displaced fracture of lateral end of right clavicle, initial encounter for closed fracture: Secondary | ICD-10-CM | POA: Diagnosis not present

## 2020-02-13 DIAGNOSIS — F0391 Unspecified dementia with behavioral disturbance: Secondary | ICD-10-CM | POA: Diagnosis not present

## 2020-02-13 DIAGNOSIS — F319 Bipolar disorder, unspecified: Secondary | ICD-10-CM | POA: Diagnosis not present

## 2020-02-13 DIAGNOSIS — G8918 Other acute postprocedural pain: Secondary | ICD-10-CM | POA: Diagnosis not present

## 2020-02-13 DIAGNOSIS — Z4682 Encounter for fitting and adjustment of non-vascular catheter: Secondary | ICD-10-CM | POA: Diagnosis not present

## 2020-02-13 DIAGNOSIS — J4520 Mild intermittent asthma, uncomplicated: Secondary | ICD-10-CM | POA: Diagnosis not present

## 2020-02-13 DIAGNOSIS — I5023 Acute on chronic systolic (congestive) heart failure: Secondary | ICD-10-CM | POA: Diagnosis not present

## 2020-02-13 DIAGNOSIS — I252 Old myocardial infarction: Secondary | ICD-10-CM | POA: Diagnosis not present

## 2020-02-13 DIAGNOSIS — I6523 Occlusion and stenosis of bilateral carotid arteries: Secondary | ICD-10-CM | POA: Diagnosis not present

## 2020-02-13 DIAGNOSIS — E0590 Thyrotoxicosis, unspecified without thyrotoxic crisis or storm: Secondary | ICD-10-CM | POA: Diagnosis not present

## 2020-02-13 DIAGNOSIS — Z7982 Long term (current) use of aspirin: Secondary | ICD-10-CM | POA: Diagnosis not present

## 2020-02-13 DIAGNOSIS — N189 Chronic kidney disease, unspecified: Secondary | ICD-10-CM | POA: Diagnosis not present

## 2020-02-13 DIAGNOSIS — I872 Venous insufficiency (chronic) (peripheral): Secondary | ICD-10-CM | POA: Diagnosis not present

## 2020-02-13 DIAGNOSIS — N390 Urinary tract infection, site not specified: Secondary | ICD-10-CM | POA: Diagnosis not present

## 2020-02-13 DIAGNOSIS — E1165 Type 2 diabetes mellitus with hyperglycemia: Secondary | ICD-10-CM | POA: Diagnosis not present

## 2020-02-13 DIAGNOSIS — E1149 Type 2 diabetes mellitus with other diabetic neurological complication: Secondary | ICD-10-CM | POA: Diagnosis not present

## 2020-02-13 DIAGNOSIS — I5043 Acute on chronic combined systolic (congestive) and diastolic (congestive) heart failure: Secondary | ICD-10-CM | POA: Diagnosis not present

## 2020-02-13 DIAGNOSIS — G43909 Migraine, unspecified, not intractable, without status migrainosus: Secondary | ICD-10-CM | POA: Diagnosis not present

## 2020-02-13 DIAGNOSIS — D631 Anemia in chronic kidney disease: Secondary | ICD-10-CM | POA: Diagnosis not present

## 2020-02-13 DIAGNOSIS — G919 Hydrocephalus, unspecified: Secondary | ICD-10-CM | POA: Diagnosis not present

## 2020-02-13 DIAGNOSIS — R351 Nocturia: Secondary | ICD-10-CM | POA: Diagnosis not present

## 2020-02-13 DIAGNOSIS — I5033 Acute on chronic diastolic (congestive) heart failure: Secondary | ICD-10-CM | POA: Diagnosis not present

## 2020-02-13 DIAGNOSIS — I4819 Other persistent atrial fibrillation: Secondary | ICD-10-CM | POA: Diagnosis not present

## 2020-02-13 DIAGNOSIS — Z66 Do not resuscitate: Secondary | ICD-10-CM | POA: Diagnosis not present

## 2020-02-13 DIAGNOSIS — S0003XA Contusion of scalp, initial encounter: Secondary | ICD-10-CM | POA: Diagnosis not present

## 2020-02-13 DIAGNOSIS — J9811 Atelectasis: Secondary | ICD-10-CM | POA: Diagnosis not present

## 2020-02-13 DIAGNOSIS — R42 Dizziness and giddiness: Secondary | ICD-10-CM | POA: Diagnosis not present

## 2020-02-14 DIAGNOSIS — H9209 Otalgia, unspecified ear: Secondary | ICD-10-CM | POA: Diagnosis not present

## 2020-02-14 DIAGNOSIS — E1122 Type 2 diabetes mellitus with diabetic chronic kidney disease: Secondary | ICD-10-CM | POA: Diagnosis not present

## 2020-02-14 DIAGNOSIS — K222 Esophageal obstruction: Secondary | ICD-10-CM | POA: Diagnosis not present

## 2020-02-14 DIAGNOSIS — M25512 Pain in left shoulder: Secondary | ICD-10-CM | POA: Diagnosis not present

## 2020-02-14 DIAGNOSIS — J90 Pleural effusion, not elsewhere classified: Secondary | ICD-10-CM | POA: Diagnosis not present

## 2020-02-14 DIAGNOSIS — E041 Nontoxic single thyroid nodule: Secondary | ICD-10-CM | POA: Diagnosis not present

## 2020-02-14 DIAGNOSIS — C44612 Basal cell carcinoma of skin of right upper limb, including shoulder: Secondary | ICD-10-CM | POA: Diagnosis not present

## 2020-02-14 DIAGNOSIS — M4316 Spondylolisthesis, lumbar region: Secondary | ICD-10-CM | POA: Diagnosis not present

## 2020-02-14 DIAGNOSIS — M1711 Unilateral primary osteoarthritis, right knee: Secondary | ICD-10-CM | POA: Diagnosis not present

## 2020-02-14 DIAGNOSIS — L97521 Non-pressure chronic ulcer of other part of left foot limited to breakdown of skin: Secondary | ICD-10-CM | POA: Diagnosis not present

## 2020-02-14 DIAGNOSIS — H6121 Impacted cerumen, right ear: Secondary | ICD-10-CM | POA: Diagnosis not present

## 2020-02-14 DIAGNOSIS — K5903 Drug induced constipation: Secondary | ICD-10-CM | POA: Diagnosis not present

## 2020-02-14 DIAGNOSIS — Z7902 Long term (current) use of antithrombotics/antiplatelets: Secondary | ICD-10-CM | POA: Diagnosis not present

## 2020-02-14 DIAGNOSIS — R110 Nausea: Secondary | ICD-10-CM | POA: Diagnosis not present

## 2020-02-14 DIAGNOSIS — R339 Retention of urine, unspecified: Secondary | ICD-10-CM | POA: Diagnosis not present

## 2020-02-14 DIAGNOSIS — I081 Rheumatic disorders of both mitral and tricuspid valves: Secondary | ICD-10-CM | POA: Diagnosis not present

## 2020-02-14 DIAGNOSIS — I071 Rheumatic tricuspid insufficiency: Secondary | ICD-10-CM | POA: Diagnosis not present

## 2020-02-14 DIAGNOSIS — M1710 Unilateral primary osteoarthritis, unspecified knee: Secondary | ICD-10-CM | POA: Diagnosis not present

## 2020-02-14 DIAGNOSIS — Z8616 Personal history of COVID-19: Secondary | ICD-10-CM | POA: Diagnosis not present

## 2020-02-14 DIAGNOSIS — K649 Unspecified hemorrhoids: Secondary | ICD-10-CM | POA: Diagnosis not present

## 2020-02-14 DIAGNOSIS — I872 Venous insufficiency (chronic) (peripheral): Secondary | ICD-10-CM | POA: Diagnosis not present

## 2020-02-14 DIAGNOSIS — S3993XA Unspecified injury of pelvis, initial encounter: Secondary | ICD-10-CM | POA: Diagnosis not present

## 2020-02-14 DIAGNOSIS — C3490 Malignant neoplasm of unspecified part of unspecified bronchus or lung: Secondary | ICD-10-CM | POA: Diagnosis not present

## 2020-02-14 DIAGNOSIS — Z436 Encounter for attention to other artificial openings of urinary tract: Secondary | ICD-10-CM | POA: Diagnosis not present

## 2020-02-14 DIAGNOSIS — I4821 Permanent atrial fibrillation: Secondary | ICD-10-CM | POA: Diagnosis not present

## 2020-02-14 DIAGNOSIS — H8113 Benign paroxysmal vertigo, bilateral: Secondary | ICD-10-CM | POA: Diagnosis not present

## 2020-02-14 DIAGNOSIS — M5430 Sciatica, unspecified side: Secondary | ICD-10-CM | POA: Diagnosis not present

## 2020-02-14 DIAGNOSIS — Z841 Family history of disorders of kidney and ureter: Secondary | ICD-10-CM | POA: Diagnosis not present

## 2020-02-14 DIAGNOSIS — L304 Erythema intertrigo: Secondary | ICD-10-CM | POA: Diagnosis not present

## 2020-02-14 DIAGNOSIS — M109 Gout, unspecified: Secondary | ICD-10-CM | POA: Diagnosis not present

## 2020-02-14 DIAGNOSIS — G20 Parkinson's disease: Secondary | ICD-10-CM | POA: Diagnosis not present

## 2020-02-14 DIAGNOSIS — M7540 Impingement syndrome of unspecified shoulder: Secondary | ICD-10-CM | POA: Diagnosis not present

## 2020-02-14 DIAGNOSIS — E034 Atrophy of thyroid (acquired): Secondary | ICD-10-CM | POA: Diagnosis not present

## 2020-02-14 DIAGNOSIS — H35033 Hypertensive retinopathy, bilateral: Secondary | ICD-10-CM | POA: Diagnosis not present

## 2020-02-14 DIAGNOSIS — R0989 Other specified symptoms and signs involving the circulatory and respiratory systems: Secondary | ICD-10-CM | POA: Diagnosis not present

## 2020-02-14 DIAGNOSIS — N952 Postmenopausal atrophic vaginitis: Secondary | ICD-10-CM | POA: Diagnosis not present

## 2020-02-14 DIAGNOSIS — D0511 Intraductal carcinoma in situ of right breast: Secondary | ICD-10-CM | POA: Diagnosis not present

## 2020-02-14 DIAGNOSIS — F4320 Adjustment disorder, unspecified: Secondary | ICD-10-CM | POA: Diagnosis not present

## 2020-02-14 DIAGNOSIS — R269 Unspecified abnormalities of gait and mobility: Secondary | ICD-10-CM | POA: Diagnosis not present

## 2020-02-14 DIAGNOSIS — C439 Malignant melanoma of skin, unspecified: Secondary | ICD-10-CM | POA: Diagnosis not present

## 2020-02-14 DIAGNOSIS — I130 Hypertensive heart and chronic kidney disease with heart failure and stage 1 through stage 4 chronic kidney disease, or unspecified chronic kidney disease: Secondary | ICD-10-CM | POA: Diagnosis not present

## 2020-02-14 DIAGNOSIS — Z4689 Encounter for fitting and adjustment of other specified devices: Secondary | ICD-10-CM | POA: Diagnosis not present

## 2020-02-14 DIAGNOSIS — C679 Malignant neoplasm of bladder, unspecified: Secondary | ICD-10-CM | POA: Diagnosis not present

## 2020-02-14 DIAGNOSIS — R0781 Pleurodynia: Secondary | ICD-10-CM | POA: Diagnosis not present

## 2020-02-14 DIAGNOSIS — H402232 Chronic angle-closure glaucoma, bilateral, moderate stage: Secondary | ICD-10-CM | POA: Diagnosis not present

## 2020-02-14 DIAGNOSIS — M65311 Trigger thumb, right thumb: Secondary | ICD-10-CM | POA: Diagnosis not present

## 2020-02-14 DIAGNOSIS — M79642 Pain in left hand: Secondary | ICD-10-CM | POA: Diagnosis not present

## 2020-02-14 DIAGNOSIS — E781 Pure hyperglyceridemia: Secondary | ICD-10-CM | POA: Diagnosis not present

## 2020-02-14 DIAGNOSIS — I209 Angina pectoris, unspecified: Secondary | ICD-10-CM | POA: Diagnosis not present

## 2020-02-14 DIAGNOSIS — M9904 Segmental and somatic dysfunction of sacral region: Secondary | ICD-10-CM | POA: Diagnosis not present

## 2020-02-14 DIAGNOSIS — M3509 Sicca syndrome with other organ involvement: Secondary | ICD-10-CM | POA: Diagnosis not present

## 2020-02-14 DIAGNOSIS — E039 Hypothyroidism, unspecified: Secondary | ICD-10-CM | POA: Diagnosis not present

## 2020-02-14 DIAGNOSIS — N186 End stage renal disease: Secondary | ICD-10-CM | POA: Diagnosis not present

## 2020-02-14 DIAGNOSIS — Z20822 Contact with and (suspected) exposure to covid-19: Secondary | ICD-10-CM | POA: Diagnosis not present

## 2020-02-14 DIAGNOSIS — M5136 Other intervertebral disc degeneration, lumbar region: Secondary | ICD-10-CM | POA: Diagnosis not present

## 2020-02-14 DIAGNOSIS — R4782 Fluency disorder in conditions classified elsewhere: Secondary | ICD-10-CM | POA: Diagnosis not present

## 2020-02-14 DIAGNOSIS — I959 Hypotension, unspecified: Secondary | ICD-10-CM | POA: Diagnosis not present

## 2020-02-14 DIAGNOSIS — S81819A Laceration without foreign body, unspecified lower leg, initial encounter: Secondary | ICD-10-CM | POA: Diagnosis not present

## 2020-02-14 DIAGNOSIS — J9811 Atelectasis: Secondary | ICD-10-CM | POA: Diagnosis not present

## 2020-02-14 DIAGNOSIS — H02889 Meibomian gland dysfunction of unspecified eye, unspecified eyelid: Secondary | ICD-10-CM | POA: Diagnosis not present

## 2020-02-14 DIAGNOSIS — I6350 Cerebral infarction due to unspecified occlusion or stenosis of unspecified cerebral artery: Secondary | ICD-10-CM | POA: Diagnosis not present

## 2020-02-14 DIAGNOSIS — N1832 Chronic kidney disease, stage 3b: Secondary | ICD-10-CM | POA: Diagnosis not present

## 2020-02-14 DIAGNOSIS — I4891 Unspecified atrial fibrillation: Secondary | ICD-10-CM | POA: Diagnosis not present

## 2020-02-14 DIAGNOSIS — N812 Incomplete uterovaginal prolapse: Secondary | ICD-10-CM | POA: Diagnosis not present

## 2020-02-14 DIAGNOSIS — I350 Nonrheumatic aortic (valve) stenosis: Secondary | ICD-10-CM | POA: Diagnosis not present

## 2020-02-14 DIAGNOSIS — R591 Generalized enlarged lymph nodes: Secondary | ICD-10-CM | POA: Diagnosis not present

## 2020-02-14 DIAGNOSIS — Z6832 Body mass index (BMI) 32.0-32.9, adult: Secondary | ICD-10-CM | POA: Diagnosis not present

## 2020-02-14 DIAGNOSIS — Z85828 Personal history of other malignant neoplasm of skin: Secondary | ICD-10-CM | POA: Diagnosis not present

## 2020-02-14 DIAGNOSIS — H6122 Impacted cerumen, left ear: Secondary | ICD-10-CM | POA: Diagnosis not present

## 2020-02-14 DIAGNOSIS — J189 Pneumonia, unspecified organism: Secondary | ICD-10-CM | POA: Diagnosis not present

## 2020-02-14 DIAGNOSIS — Z6838 Body mass index (BMI) 38.0-38.9, adult: Secondary | ICD-10-CM | POA: Diagnosis not present

## 2020-02-14 DIAGNOSIS — M5116 Intervertebral disc disorders with radiculopathy, lumbar region: Secondary | ICD-10-CM | POA: Diagnosis not present

## 2020-02-14 DIAGNOSIS — M7020 Olecranon bursitis, unspecified elbow: Secondary | ICD-10-CM | POA: Diagnosis not present

## 2020-02-14 DIAGNOSIS — L821 Other seborrheic keratosis: Secondary | ICD-10-CM | POA: Diagnosis not present

## 2020-02-14 DIAGNOSIS — M5031 Other cervical disc degeneration,  high cervical region: Secondary | ICD-10-CM | POA: Diagnosis not present

## 2020-02-14 DIAGNOSIS — Z888 Allergy status to other drugs, medicaments and biological substances status: Secondary | ICD-10-CM | POA: Diagnosis not present

## 2020-02-14 DIAGNOSIS — J4520 Mild intermittent asthma, uncomplicated: Secondary | ICD-10-CM | POA: Diagnosis not present

## 2020-02-14 DIAGNOSIS — R7301 Impaired fasting glucose: Secondary | ICD-10-CM | POA: Diagnosis not present

## 2020-02-14 DIAGNOSIS — R1110 Vomiting, unspecified: Secondary | ICD-10-CM | POA: Diagnosis not present

## 2020-02-14 DIAGNOSIS — N390 Urinary tract infection, site not specified: Secondary | ICD-10-CM | POA: Diagnosis not present

## 2020-02-14 DIAGNOSIS — N401 Enlarged prostate with lower urinary tract symptoms: Secondary | ICD-10-CM | POA: Diagnosis not present

## 2020-02-14 DIAGNOSIS — F419 Anxiety disorder, unspecified: Secondary | ICD-10-CM | POA: Diagnosis not present

## 2020-02-14 DIAGNOSIS — J9611 Chronic respiratory failure with hypoxia: Secondary | ICD-10-CM | POA: Diagnosis not present

## 2020-02-14 DIAGNOSIS — H52223 Regular astigmatism, bilateral: Secondary | ICD-10-CM | POA: Diagnosis not present

## 2020-02-14 DIAGNOSIS — D509 Iron deficiency anemia, unspecified: Secondary | ICD-10-CM | POA: Diagnosis not present

## 2020-02-14 DIAGNOSIS — M48062 Spinal stenosis, lumbar region with neurogenic claudication: Secondary | ICD-10-CM | POA: Diagnosis not present

## 2020-02-14 DIAGNOSIS — Z95810 Presence of automatic (implantable) cardiac defibrillator: Secondary | ICD-10-CM | POA: Diagnosis not present

## 2020-02-14 DIAGNOSIS — I69351 Hemiplegia and hemiparesis following cerebral infarction affecting right dominant side: Secondary | ICD-10-CM | POA: Diagnosis not present

## 2020-02-14 DIAGNOSIS — Z7982 Long term (current) use of aspirin: Secondary | ICD-10-CM | POA: Diagnosis not present

## 2020-02-14 DIAGNOSIS — N183 Chronic kidney disease, stage 3 unspecified: Secondary | ICD-10-CM | POA: Diagnosis not present

## 2020-02-14 DIAGNOSIS — N400 Enlarged prostate without lower urinary tract symptoms: Secondary | ICD-10-CM | POA: Diagnosis not present

## 2020-02-14 DIAGNOSIS — Z961 Presence of intraocular lens: Secondary | ICD-10-CM | POA: Diagnosis not present

## 2020-02-14 DIAGNOSIS — Z1389 Encounter for screening for other disorder: Secondary | ICD-10-CM | POA: Diagnosis not present

## 2020-02-14 DIAGNOSIS — I5032 Chronic diastolic (congestive) heart failure: Secondary | ICD-10-CM | POA: Diagnosis not present

## 2020-02-14 DIAGNOSIS — N815 Vaginal enterocele: Secondary | ICD-10-CM | POA: Diagnosis not present

## 2020-02-14 DIAGNOSIS — Z433 Encounter for attention to colostomy: Secondary | ICD-10-CM | POA: Diagnosis not present

## 2020-02-14 DIAGNOSIS — Z8719 Personal history of other diseases of the digestive system: Secondary | ICD-10-CM | POA: Diagnosis not present

## 2020-02-14 DIAGNOSIS — Z6822 Body mass index (BMI) 22.0-22.9, adult: Secondary | ICD-10-CM | POA: Diagnosis not present

## 2020-02-14 DIAGNOSIS — R413 Other amnesia: Secondary | ICD-10-CM | POA: Diagnosis not present

## 2020-02-14 DIAGNOSIS — R260 Ataxic gait: Secondary | ICD-10-CM | POA: Diagnosis not present

## 2020-02-14 DIAGNOSIS — M810 Age-related osteoporosis without current pathological fracture: Secondary | ICD-10-CM | POA: Diagnosis not present

## 2020-02-14 DIAGNOSIS — H547 Unspecified visual loss: Secondary | ICD-10-CM | POA: Diagnosis not present

## 2020-02-14 DIAGNOSIS — Z96612 Presence of left artificial shoulder joint: Secondary | ICD-10-CM | POA: Diagnosis not present

## 2020-02-14 DIAGNOSIS — L570 Actinic keratosis: Secondary | ICD-10-CM | POA: Diagnosis not present

## 2020-02-14 DIAGNOSIS — F4321 Adjustment disorder with depressed mood: Secondary | ICD-10-CM | POA: Diagnosis not present

## 2020-02-14 DIAGNOSIS — R300 Dysuria: Secondary | ICD-10-CM | POA: Diagnosis not present

## 2020-02-14 DIAGNOSIS — F325 Major depressive disorder, single episode, in full remission: Secondary | ICD-10-CM | POA: Diagnosis not present

## 2020-02-14 DIAGNOSIS — I051 Rheumatic mitral insufficiency: Secondary | ICD-10-CM | POA: Diagnosis not present

## 2020-02-14 DIAGNOSIS — R569 Unspecified convulsions: Secondary | ICD-10-CM | POA: Diagnosis not present

## 2020-02-14 DIAGNOSIS — Z4801 Encounter for change or removal of surgical wound dressing: Secondary | ICD-10-CM | POA: Diagnosis not present

## 2020-02-14 DIAGNOSIS — Z823 Family history of stroke: Secondary | ICD-10-CM | POA: Diagnosis not present

## 2020-02-14 DIAGNOSIS — M8580 Other specified disorders of bone density and structure, unspecified site: Secondary | ICD-10-CM | POA: Diagnosis not present

## 2020-02-14 DIAGNOSIS — R404 Transient alteration of awareness: Secondary | ICD-10-CM | POA: Diagnosis not present

## 2020-02-14 DIAGNOSIS — L97529 Non-pressure chronic ulcer of other part of left foot with unspecified severity: Secondary | ICD-10-CM | POA: Diagnosis not present

## 2020-02-14 DIAGNOSIS — Z880 Allergy status to penicillin: Secondary | ICD-10-CM | POA: Diagnosis not present

## 2020-02-14 DIAGNOSIS — R634 Abnormal weight loss: Secondary | ICD-10-CM | POA: Diagnosis not present

## 2020-02-14 DIAGNOSIS — I491 Atrial premature depolarization: Secondary | ICD-10-CM | POA: Diagnosis not present

## 2020-02-14 DIAGNOSIS — H93291 Other abnormal auditory perceptions, right ear: Secondary | ICD-10-CM | POA: Diagnosis not present

## 2020-02-14 DIAGNOSIS — Z79899 Other long term (current) drug therapy: Secondary | ICD-10-CM | POA: Diagnosis not present

## 2020-02-14 DIAGNOSIS — K2950 Unspecified chronic gastritis without bleeding: Secondary | ICD-10-CM | POA: Diagnosis not present

## 2020-02-14 DIAGNOSIS — Z9181 History of falling: Secondary | ICD-10-CM | POA: Diagnosis not present

## 2020-02-14 DIAGNOSIS — F603 Borderline personality disorder: Secondary | ICD-10-CM | POA: Diagnosis not present

## 2020-02-14 DIAGNOSIS — M25511 Pain in right shoulder: Secondary | ICD-10-CM | POA: Diagnosis not present

## 2020-02-14 DIAGNOSIS — I83028 Varicose veins of left lower extremity with ulcer other part of lower leg: Secondary | ICD-10-CM | POA: Diagnosis not present

## 2020-02-14 DIAGNOSIS — M47816 Spondylosis without myelopathy or radiculopathy, lumbar region: Secondary | ICD-10-CM | POA: Diagnosis not present

## 2020-02-14 DIAGNOSIS — J438 Other emphysema: Secondary | ICD-10-CM | POA: Diagnosis not present

## 2020-02-14 DIAGNOSIS — I5030 Unspecified diastolic (congestive) heart failure: Secondary | ICD-10-CM | POA: Diagnosis not present

## 2020-02-14 DIAGNOSIS — H269 Unspecified cataract: Secondary | ICD-10-CM | POA: Diagnosis not present

## 2020-02-14 DIAGNOSIS — R0602 Shortness of breath: Secondary | ICD-10-CM | POA: Diagnosis not present

## 2020-02-14 DIAGNOSIS — I252 Old myocardial infarction: Secondary | ICD-10-CM | POA: Diagnosis not present

## 2020-02-14 DIAGNOSIS — E785 Hyperlipidemia, unspecified: Secondary | ICD-10-CM | POA: Diagnosis not present

## 2020-02-14 DIAGNOSIS — G40109 Localization-related (focal) (partial) symptomatic epilepsy and epileptic syndromes with simple partial seizures, not intractable, without status epilepticus: Secondary | ICD-10-CM | POA: Diagnosis not present

## 2020-02-14 DIAGNOSIS — I517 Cardiomegaly: Secondary | ICD-10-CM | POA: Diagnosis not present

## 2020-02-14 DIAGNOSIS — R9431 Abnormal electrocardiogram [ECG] [EKG]: Secondary | ICD-10-CM | POA: Diagnosis not present

## 2020-02-14 DIAGNOSIS — D508 Other iron deficiency anemias: Secondary | ICD-10-CM | POA: Diagnosis not present

## 2020-02-14 DIAGNOSIS — L0501 Pilonidal cyst with abscess: Secondary | ICD-10-CM | POA: Diagnosis not present

## 2020-02-14 DIAGNOSIS — I824Z3 Acute embolism and thrombosis of unspecified deep veins of distal lower extremity, bilateral: Secondary | ICD-10-CM | POA: Diagnosis not present

## 2020-02-14 DIAGNOSIS — T162XXA Foreign body in left ear, initial encounter: Secondary | ICD-10-CM | POA: Diagnosis not present

## 2020-02-14 DIAGNOSIS — Z0189 Encounter for other specified special examinations: Secondary | ICD-10-CM | POA: Diagnosis not present

## 2020-02-14 DIAGNOSIS — R3914 Feeling of incomplete bladder emptying: Secondary | ICD-10-CM | POA: Diagnosis not present

## 2020-02-14 DIAGNOSIS — I716 Thoracoabdominal aortic aneurysm, without rupture: Secondary | ICD-10-CM | POA: Diagnosis not present

## 2020-02-14 DIAGNOSIS — L97509 Non-pressure chronic ulcer of other part of unspecified foot with unspecified severity: Secondary | ICD-10-CM | POA: Diagnosis not present

## 2020-02-14 DIAGNOSIS — K838 Other specified diseases of biliary tract: Secondary | ICD-10-CM | POA: Diagnosis not present

## 2020-02-14 DIAGNOSIS — G4489 Other headache syndrome: Secondary | ICD-10-CM | POA: Diagnosis not present

## 2020-02-14 DIAGNOSIS — E876 Hypokalemia: Secondary | ICD-10-CM | POA: Diagnosis not present

## 2020-02-14 DIAGNOSIS — Z95820 Peripheral vascular angioplasty status with implants and grafts: Secondary | ICD-10-CM | POA: Diagnosis not present

## 2020-02-14 DIAGNOSIS — S81801A Unspecified open wound, right lower leg, initial encounter: Secondary | ICD-10-CM | POA: Diagnosis not present

## 2020-02-14 DIAGNOSIS — E6609 Other obesity due to excess calories: Secondary | ICD-10-CM | POA: Diagnosis not present

## 2020-02-14 DIAGNOSIS — H52222 Regular astigmatism, left eye: Secondary | ICD-10-CM | POA: Diagnosis not present

## 2020-02-14 DIAGNOSIS — M79672 Pain in left foot: Secondary | ICD-10-CM | POA: Diagnosis not present

## 2020-02-14 DIAGNOSIS — N398 Other specified disorders of urinary system: Secondary | ICD-10-CM | POA: Diagnosis not present

## 2020-02-14 DIAGNOSIS — D62 Acute posthemorrhagic anemia: Secondary | ICD-10-CM | POA: Diagnosis not present

## 2020-02-14 DIAGNOSIS — Z01818 Encounter for other preprocedural examination: Secondary | ICD-10-CM | POA: Diagnosis not present

## 2020-02-14 DIAGNOSIS — G40909 Epilepsy, unspecified, not intractable, without status epilepticus: Secondary | ICD-10-CM | POA: Diagnosis not present

## 2020-02-14 DIAGNOSIS — R809 Proteinuria, unspecified: Secondary | ICD-10-CM | POA: Diagnosis not present

## 2020-02-14 DIAGNOSIS — C44319 Basal cell carcinoma of skin of other parts of face: Secondary | ICD-10-CM | POA: Diagnosis not present

## 2020-02-14 DIAGNOSIS — E210 Primary hyperparathyroidism: Secondary | ICD-10-CM | POA: Diagnosis not present

## 2020-02-14 DIAGNOSIS — H04123 Dry eye syndrome of bilateral lacrimal glands: Secondary | ICD-10-CM | POA: Diagnosis not present

## 2020-02-14 DIAGNOSIS — R0600 Dyspnea, unspecified: Secondary | ICD-10-CM | POA: Diagnosis not present

## 2020-02-14 DIAGNOSIS — Z23 Encounter for immunization: Secondary | ICD-10-CM | POA: Diagnosis not present

## 2020-02-14 DIAGNOSIS — G930 Cerebral cysts: Secondary | ICD-10-CM | POA: Diagnosis not present

## 2020-02-14 DIAGNOSIS — F1120 Opioid dependence, uncomplicated: Secondary | ICD-10-CM | POA: Diagnosis not present

## 2020-02-14 DIAGNOSIS — Z0001 Encounter for general adult medical examination with abnormal findings: Secondary | ICD-10-CM | POA: Diagnosis not present

## 2020-02-14 DIAGNOSIS — M47814 Spondylosis without myelopathy or radiculopathy, thoracic region: Secondary | ICD-10-CM | POA: Diagnosis not present

## 2020-02-14 DIAGNOSIS — I214 Non-ST elevation (NSTEMI) myocardial infarction: Secondary | ICD-10-CM | POA: Diagnosis not present

## 2020-02-14 DIAGNOSIS — G459 Transient cerebral ischemic attack, unspecified: Secondary | ICD-10-CM | POA: Diagnosis not present

## 2020-02-14 DIAGNOSIS — J3089 Other allergic rhinitis: Secondary | ICD-10-CM | POA: Diagnosis not present

## 2020-02-14 DIAGNOSIS — Z853 Personal history of malignant neoplasm of breast: Secondary | ICD-10-CM | POA: Diagnosis not present

## 2020-02-14 DIAGNOSIS — R338 Other retention of urine: Secondary | ICD-10-CM | POA: Diagnosis not present

## 2020-02-14 DIAGNOSIS — I6521 Occlusion and stenosis of right carotid artery: Secondary | ICD-10-CM | POA: Diagnosis not present

## 2020-02-14 DIAGNOSIS — F330 Major depressive disorder, recurrent, mild: Secondary | ICD-10-CM | POA: Diagnosis not present

## 2020-02-14 DIAGNOSIS — Z125 Encounter for screening for malignant neoplasm of prostate: Secondary | ICD-10-CM | POA: Diagnosis not present

## 2020-02-14 DIAGNOSIS — E11621 Type 2 diabetes mellitus with foot ulcer: Secondary | ICD-10-CM | POA: Diagnosis not present

## 2020-02-14 DIAGNOSIS — M549 Dorsalgia, unspecified: Secondary | ICD-10-CM | POA: Diagnosis not present

## 2020-02-14 DIAGNOSIS — D72829 Elevated white blood cell count, unspecified: Secondary | ICD-10-CM | POA: Diagnosis not present

## 2020-02-14 DIAGNOSIS — I83018 Varicose veins of right lower extremity with ulcer other part of lower leg: Secondary | ICD-10-CM | POA: Diagnosis not present

## 2020-02-14 DIAGNOSIS — I50812 Chronic right heart failure: Secondary | ICD-10-CM | POA: Diagnosis not present

## 2020-02-14 DIAGNOSIS — E220 Acromegaly and pituitary gigantism: Secondary | ICD-10-CM | POA: Diagnosis not present

## 2020-02-14 DIAGNOSIS — M169 Osteoarthritis of hip, unspecified: Secondary | ICD-10-CM | POA: Diagnosis not present

## 2020-02-14 DIAGNOSIS — Z713 Dietary counseling and surveillance: Secondary | ICD-10-CM | POA: Diagnosis not present

## 2020-02-14 DIAGNOSIS — G63 Polyneuropathy in diseases classified elsewhere: Secondary | ICD-10-CM | POA: Diagnosis not present

## 2020-02-14 DIAGNOSIS — R410 Disorientation, unspecified: Secondary | ICD-10-CM | POA: Diagnosis not present

## 2020-02-14 DIAGNOSIS — K635 Polyp of colon: Secondary | ICD-10-CM | POA: Diagnosis not present

## 2020-02-14 DIAGNOSIS — E7801 Familial hypercholesterolemia: Secondary | ICD-10-CM | POA: Diagnosis not present

## 2020-02-14 DIAGNOSIS — J849 Interstitial pulmonary disease, unspecified: Secondary | ICD-10-CM | POA: Diagnosis not present

## 2020-02-14 DIAGNOSIS — G250 Essential tremor: Secondary | ICD-10-CM | POA: Diagnosis not present

## 2020-02-14 DIAGNOSIS — R262 Difficulty in walking, not elsewhere classified: Secondary | ICD-10-CM | POA: Diagnosis not present

## 2020-02-14 DIAGNOSIS — J3081 Allergic rhinitis due to animal (cat) (dog) hair and dander: Secondary | ICD-10-CM | POA: Diagnosis not present

## 2020-02-14 DIAGNOSIS — C155 Malignant neoplasm of lower third of esophagus: Secondary | ICD-10-CM | POA: Diagnosis not present

## 2020-02-14 DIAGNOSIS — A6000 Herpesviral infection of urogenital system, unspecified: Secondary | ICD-10-CM | POA: Diagnosis not present

## 2020-02-14 DIAGNOSIS — N138 Other obstructive and reflux uropathy: Secondary | ICD-10-CM | POA: Diagnosis not present

## 2020-02-14 DIAGNOSIS — F0390 Unspecified dementia without behavioral disturbance: Secondary | ICD-10-CM | POA: Diagnosis not present

## 2020-02-14 DIAGNOSIS — A419 Sepsis, unspecified organism: Secondary | ICD-10-CM | POA: Diagnosis not present

## 2020-02-14 DIAGNOSIS — H9193 Unspecified hearing loss, bilateral: Secondary | ICD-10-CM | POA: Diagnosis not present

## 2020-02-14 DIAGNOSIS — N210 Calculus in bladder: Secondary | ICD-10-CM | POA: Diagnosis not present

## 2020-02-14 DIAGNOSIS — G8918 Other acute postprocedural pain: Secondary | ICD-10-CM | POA: Diagnosis not present

## 2020-02-14 DIAGNOSIS — M1990 Unspecified osteoarthritis, unspecified site: Secondary | ICD-10-CM | POA: Diagnosis not present

## 2020-02-14 DIAGNOSIS — S0181XD Laceration without foreign body of other part of head, subsequent encounter: Secondary | ICD-10-CM | POA: Diagnosis not present

## 2020-02-14 DIAGNOSIS — N189 Chronic kidney disease, unspecified: Secondary | ICD-10-CM | POA: Diagnosis not present

## 2020-02-14 DIAGNOSIS — D485 Neoplasm of uncertain behavior of skin: Secondary | ICD-10-CM | POA: Diagnosis not present

## 2020-02-14 DIAGNOSIS — Z87442 Personal history of urinary calculi: Secondary | ICD-10-CM | POA: Diagnosis not present

## 2020-02-14 DIAGNOSIS — I21A1 Myocardial infarction type 2: Secondary | ICD-10-CM | POA: Diagnosis not present

## 2020-02-14 DIAGNOSIS — G9009 Other idiopathic peripheral autonomic neuropathy: Secondary | ICD-10-CM | POA: Diagnosis not present

## 2020-02-14 DIAGNOSIS — M25562 Pain in left knee: Secondary | ICD-10-CM | POA: Diagnosis not present

## 2020-02-14 DIAGNOSIS — D303 Benign neoplasm of bladder: Secondary | ICD-10-CM | POA: Diagnosis not present

## 2020-02-14 DIAGNOSIS — H9190 Unspecified hearing loss, unspecified ear: Secondary | ICD-10-CM | POA: Diagnosis not present

## 2020-02-14 DIAGNOSIS — F39 Unspecified mood [affective] disorder: Secondary | ICD-10-CM | POA: Diagnosis not present

## 2020-02-14 DIAGNOSIS — I63212 Cerebral infarction due to unspecified occlusion or stenosis of left vertebral arteries: Secondary | ICD-10-CM | POA: Diagnosis not present

## 2020-02-14 DIAGNOSIS — F909 Attention-deficit hyperactivity disorder, unspecified type: Secondary | ICD-10-CM | POA: Diagnosis not present

## 2020-02-14 DIAGNOSIS — M9902 Segmental and somatic dysfunction of thoracic region: Secondary | ICD-10-CM | POA: Diagnosis not present

## 2020-02-14 DIAGNOSIS — Z1331 Encounter for screening for depression: Secondary | ICD-10-CM | POA: Diagnosis not present

## 2020-02-14 DIAGNOSIS — L659 Nonscarring hair loss, unspecified: Secondary | ICD-10-CM | POA: Diagnosis not present

## 2020-02-14 DIAGNOSIS — R531 Weakness: Secondary | ICD-10-CM | POA: Diagnosis not present

## 2020-02-14 DIAGNOSIS — R457 State of emotional shock and stress, unspecified: Secondary | ICD-10-CM | POA: Diagnosis not present

## 2020-02-14 DIAGNOSIS — S78111D Complete traumatic amputation at level between right hip and knee, subsequent encounter: Secondary | ICD-10-CM | POA: Diagnosis not present

## 2020-02-14 DIAGNOSIS — M79641 Pain in right hand: Secondary | ICD-10-CM | POA: Diagnosis not present

## 2020-02-14 DIAGNOSIS — Z5181 Encounter for therapeutic drug level monitoring: Secondary | ICD-10-CM | POA: Diagnosis not present

## 2020-02-14 DIAGNOSIS — G5601 Carpal tunnel syndrome, right upper limb: Secondary | ICD-10-CM | POA: Diagnosis not present

## 2020-02-14 DIAGNOSIS — E11610 Type 2 diabetes mellitus with diabetic neuropathic arthropathy: Secondary | ICD-10-CM | POA: Diagnosis not present

## 2020-02-14 DIAGNOSIS — Z780 Asymptomatic menopausal state: Secondary | ICD-10-CM | POA: Diagnosis not present

## 2020-02-14 DIAGNOSIS — M5416 Radiculopathy, lumbar region: Secondary | ICD-10-CM | POA: Diagnosis not present

## 2020-02-14 DIAGNOSIS — K5730 Diverticulosis of large intestine without perforation or abscess without bleeding: Secondary | ICD-10-CM | POA: Diagnosis not present

## 2020-02-14 DIAGNOSIS — I739 Peripheral vascular disease, unspecified: Secondary | ICD-10-CM | POA: Diagnosis not present

## 2020-02-14 DIAGNOSIS — M25552 Pain in left hip: Secondary | ICD-10-CM | POA: Diagnosis not present

## 2020-02-14 DIAGNOSIS — G2581 Restless legs syndrome: Secondary | ICD-10-CM | POA: Diagnosis not present

## 2020-02-14 DIAGNOSIS — F339 Major depressive disorder, recurrent, unspecified: Secondary | ICD-10-CM | POA: Diagnosis not present

## 2020-02-14 DIAGNOSIS — E1142 Type 2 diabetes mellitus with diabetic polyneuropathy: Secondary | ICD-10-CM | POA: Diagnosis not present

## 2020-02-14 DIAGNOSIS — C441122 Basal cell carcinoma of skin of right lower eyelid, including canthus: Secondary | ICD-10-CM | POA: Diagnosis not present

## 2020-02-14 DIAGNOSIS — K224 Dyskinesia of esophagus: Secondary | ICD-10-CM | POA: Diagnosis not present

## 2020-02-14 DIAGNOSIS — J40 Bronchitis, not specified as acute or chronic: Secondary | ICD-10-CM | POA: Diagnosis not present

## 2020-02-14 DIAGNOSIS — F411 Generalized anxiety disorder: Secondary | ICD-10-CM | POA: Diagnosis not present

## 2020-02-14 DIAGNOSIS — K900 Celiac disease: Secondary | ICD-10-CM | POA: Diagnosis not present

## 2020-02-14 DIAGNOSIS — E1129 Type 2 diabetes mellitus with other diabetic kidney complication: Secondary | ICD-10-CM | POA: Diagnosis not present

## 2020-02-14 DIAGNOSIS — Z7984 Long term (current) use of oral hypoglycemic drugs: Secondary | ICD-10-CM | POA: Diagnosis not present

## 2020-02-14 DIAGNOSIS — E1121 Type 2 diabetes mellitus with diabetic nephropathy: Secondary | ICD-10-CM | POA: Diagnosis not present

## 2020-02-14 DIAGNOSIS — S0033XA Contusion of nose, initial encounter: Secondary | ICD-10-CM | POA: Diagnosis not present

## 2020-02-14 DIAGNOSIS — H4089 Other specified glaucoma: Secondary | ICD-10-CM | POA: Diagnosis not present

## 2020-02-14 DIAGNOSIS — Z03818 Encounter for observation for suspected exposure to other biological agents ruled out: Secondary | ICD-10-CM | POA: Diagnosis not present

## 2020-02-14 DIAGNOSIS — Z9079 Acquired absence of other genital organ(s): Secondary | ICD-10-CM | POA: Diagnosis not present

## 2020-02-14 DIAGNOSIS — T86828 Other complications of skin graft (allograft) (autograft): Secondary | ICD-10-CM | POA: Diagnosis not present

## 2020-02-14 DIAGNOSIS — Z90722 Acquired absence of ovaries, bilateral: Secondary | ICD-10-CM | POA: Diagnosis not present

## 2020-02-14 DIAGNOSIS — E11622 Type 2 diabetes mellitus with other skin ulcer: Secondary | ICD-10-CM | POA: Diagnosis not present

## 2020-02-14 DIAGNOSIS — I447 Left bundle-branch block, unspecified: Secondary | ICD-10-CM | POA: Diagnosis not present

## 2020-02-14 DIAGNOSIS — N3000 Acute cystitis without hematuria: Secondary | ICD-10-CM | POA: Diagnosis not present

## 2020-02-14 DIAGNOSIS — R32 Unspecified urinary incontinence: Secondary | ICD-10-CM | POA: Diagnosis not present

## 2020-02-14 DIAGNOSIS — B351 Tinea unguium: Secondary | ICD-10-CM | POA: Diagnosis not present

## 2020-02-14 DIAGNOSIS — H524 Presbyopia: Secondary | ICD-10-CM | POA: Diagnosis not present

## 2020-02-14 DIAGNOSIS — R600 Localized edema: Secondary | ICD-10-CM | POA: Diagnosis not present

## 2020-02-14 DIAGNOSIS — I10 Essential (primary) hypertension: Secondary | ICD-10-CM | POA: Diagnosis not present

## 2020-02-14 DIAGNOSIS — B0229 Other postherpetic nervous system involvement: Secondary | ICD-10-CM | POA: Diagnosis not present

## 2020-02-14 DIAGNOSIS — I83019 Varicose veins of right lower extremity with ulcer of unspecified site: Secondary | ICD-10-CM | POA: Diagnosis not present

## 2020-02-14 DIAGNOSIS — M4727 Other spondylosis with radiculopathy, lumbosacral region: Secondary | ICD-10-CM | POA: Diagnosis not present

## 2020-02-14 DIAGNOSIS — H43811 Vitreous degeneration, right eye: Secondary | ICD-10-CM | POA: Diagnosis not present

## 2020-02-14 DIAGNOSIS — S32592D Other specified fracture of left pubis, subsequent encounter for fracture with routine healing: Secondary | ICD-10-CM | POA: Diagnosis not present

## 2020-02-14 DIAGNOSIS — Z96653 Presence of artificial knee joint, bilateral: Secondary | ICD-10-CM | POA: Diagnosis not present

## 2020-02-14 DIAGNOSIS — Z8041 Family history of malignant neoplasm of ovary: Secondary | ICD-10-CM | POA: Diagnosis not present

## 2020-02-14 DIAGNOSIS — R911 Solitary pulmonary nodule: Secondary | ICD-10-CM | POA: Diagnosis not present

## 2020-02-14 DIAGNOSIS — H2512 Age-related nuclear cataract, left eye: Secondary | ICD-10-CM | POA: Diagnosis not present

## 2020-02-14 DIAGNOSIS — M4126 Other idiopathic scoliosis, lumbar region: Secondary | ICD-10-CM | POA: Diagnosis not present

## 2020-02-14 DIAGNOSIS — F17200 Nicotine dependence, unspecified, uncomplicated: Secondary | ICD-10-CM | POA: Diagnosis not present

## 2020-02-14 DIAGNOSIS — K5790 Diverticulosis of intestine, part unspecified, without perforation or abscess without bleeding: Secondary | ICD-10-CM | POA: Diagnosis not present

## 2020-02-14 DIAGNOSIS — M25551 Pain in right hip: Secondary | ICD-10-CM | POA: Diagnosis not present

## 2020-02-14 DIAGNOSIS — E1159 Type 2 diabetes mellitus with other circulatory complications: Secondary | ICD-10-CM | POA: Diagnosis not present

## 2020-02-14 DIAGNOSIS — I499 Cardiac arrhythmia, unspecified: Secondary | ICD-10-CM | POA: Diagnosis not present

## 2020-02-14 DIAGNOSIS — Z803 Family history of malignant neoplasm of breast: Secondary | ICD-10-CM | POA: Diagnosis not present

## 2020-02-14 DIAGNOSIS — K589 Irritable bowel syndrome without diarrhea: Secondary | ICD-10-CM | POA: Diagnosis not present

## 2020-02-14 DIAGNOSIS — Z96641 Presence of right artificial hip joint: Secondary | ICD-10-CM | POA: Diagnosis not present

## 2020-02-14 DIAGNOSIS — N888 Other specified noninflammatory disorders of cervix uteri: Secondary | ICD-10-CM | POA: Diagnosis not present

## 2020-02-14 DIAGNOSIS — M9901 Segmental and somatic dysfunction of cervical region: Secondary | ICD-10-CM | POA: Diagnosis not present

## 2020-02-14 DIAGNOSIS — G8114 Spastic hemiplegia affecting left nondominant side: Secondary | ICD-10-CM | POA: Diagnosis not present

## 2020-02-14 DIAGNOSIS — N644 Mastodynia: Secondary | ICD-10-CM | POA: Diagnosis not present

## 2020-02-14 DIAGNOSIS — Z6841 Body Mass Index (BMI) 40.0 and over, adult: Secondary | ICD-10-CM | POA: Diagnosis not present

## 2020-02-14 DIAGNOSIS — I2510 Atherosclerotic heart disease of native coronary artery without angina pectoris: Secondary | ICD-10-CM | POA: Diagnosis not present

## 2020-02-14 DIAGNOSIS — Z791 Long term (current) use of non-steroidal anti-inflammatories (NSAID): Secondary | ICD-10-CM | POA: Diagnosis not present

## 2020-02-14 DIAGNOSIS — G4700 Insomnia, unspecified: Secondary | ICD-10-CM | POA: Diagnosis not present

## 2020-02-14 DIAGNOSIS — Z1231 Encounter for screening mammogram for malignant neoplasm of breast: Secondary | ICD-10-CM | POA: Diagnosis not present

## 2020-02-14 DIAGNOSIS — Z8673 Personal history of transient ischemic attack (TIA), and cerebral infarction without residual deficits: Secondary | ICD-10-CM | POA: Diagnosis not present

## 2020-02-14 DIAGNOSIS — I452 Bifascicular block: Secondary | ICD-10-CM | POA: Diagnosis not present

## 2020-02-14 DIAGNOSIS — Z452 Encounter for adjustment and management of vascular access device: Secondary | ICD-10-CM | POA: Diagnosis not present

## 2020-02-14 DIAGNOSIS — Z1329 Encounter for screening for other suspected endocrine disorder: Secondary | ICD-10-CM | POA: Diagnosis not present

## 2020-02-14 DIAGNOSIS — M79674 Pain in right toe(s): Secondary | ICD-10-CM | POA: Diagnosis not present

## 2020-02-14 DIAGNOSIS — D692 Other nonthrombocytopenic purpura: Secondary | ICD-10-CM | POA: Diagnosis not present

## 2020-02-14 DIAGNOSIS — M1812 Unilateral primary osteoarthritis of first carpometacarpal joint, left hand: Secondary | ICD-10-CM | POA: Diagnosis not present

## 2020-02-14 DIAGNOSIS — Z7409 Other reduced mobility: Secondary | ICD-10-CM | POA: Diagnosis not present

## 2020-02-14 DIAGNOSIS — G4089 Other seizures: Secondary | ICD-10-CM | POA: Diagnosis not present

## 2020-02-14 DIAGNOSIS — M9903 Segmental and somatic dysfunction of lumbar region: Secondary | ICD-10-CM | POA: Diagnosis not present

## 2020-02-14 DIAGNOSIS — Z951 Presence of aortocoronary bypass graft: Secondary | ICD-10-CM | POA: Diagnosis not present

## 2020-02-14 DIAGNOSIS — N926 Irregular menstruation, unspecified: Secondary | ICD-10-CM | POA: Diagnosis not present

## 2020-02-14 DIAGNOSIS — Z9884 Bariatric surgery status: Secondary | ICD-10-CM | POA: Diagnosis not present

## 2020-02-14 DIAGNOSIS — E872 Acidosis: Secondary | ICD-10-CM | POA: Diagnosis not present

## 2020-02-14 DIAGNOSIS — I712 Thoracic aortic aneurysm, without rupture: Secondary | ICD-10-CM | POA: Diagnosis not present

## 2020-02-14 DIAGNOSIS — S8010XA Contusion of unspecified lower leg, initial encounter: Secondary | ICD-10-CM | POA: Diagnosis not present

## 2020-02-14 DIAGNOSIS — H25811 Combined forms of age-related cataract, right eye: Secondary | ICD-10-CM | POA: Diagnosis not present

## 2020-02-14 DIAGNOSIS — Z6830 Body mass index (BMI) 30.0-30.9, adult: Secondary | ICD-10-CM | POA: Diagnosis not present

## 2020-02-14 DIAGNOSIS — F3342 Major depressive disorder, recurrent, in full remission: Secondary | ICD-10-CM | POA: Diagnosis not present

## 2020-02-14 DIAGNOSIS — R8290 Unspecified abnormal findings in urine: Secondary | ICD-10-CM | POA: Diagnosis not present

## 2020-02-14 DIAGNOSIS — M545 Low back pain: Secondary | ICD-10-CM | POA: Diagnosis not present

## 2020-02-14 DIAGNOSIS — R1909 Other intra-abdominal and pelvic swelling, mass and lump: Secondary | ICD-10-CM | POA: Diagnosis not present

## 2020-02-14 DIAGNOSIS — I509 Heart failure, unspecified: Secondary | ICD-10-CM | POA: Diagnosis not present

## 2020-02-14 DIAGNOSIS — M25561 Pain in right knee: Secondary | ICD-10-CM | POA: Diagnosis not present

## 2020-02-14 DIAGNOSIS — N1831 Chronic kidney disease, stage 3a: Secondary | ICD-10-CM | POA: Diagnosis not present

## 2020-02-14 DIAGNOSIS — M353 Polymyalgia rheumatica: Secondary | ICD-10-CM | POA: Diagnosis not present

## 2020-02-14 DIAGNOSIS — R188 Other ascites: Secondary | ICD-10-CM | POA: Diagnosis not present

## 2020-02-14 DIAGNOSIS — E782 Mixed hyperlipidemia: Secondary | ICD-10-CM | POA: Diagnosis not present

## 2020-02-14 DIAGNOSIS — J986 Disorders of diaphragm: Secondary | ICD-10-CM | POA: Diagnosis not present

## 2020-02-14 DIAGNOSIS — F418 Other specified anxiety disorders: Secondary | ICD-10-CM | POA: Diagnosis not present

## 2020-02-14 DIAGNOSIS — S0191XD Laceration without foreign body of unspecified part of head, subsequent encounter: Secondary | ICD-10-CM | POA: Diagnosis not present

## 2020-02-14 DIAGNOSIS — G8911 Acute pain due to trauma: Secondary | ICD-10-CM | POA: Diagnosis not present

## 2020-02-14 DIAGNOSIS — R42 Dizziness and giddiness: Secondary | ICD-10-CM | POA: Diagnosis not present

## 2020-02-14 DIAGNOSIS — M5412 Radiculopathy, cervical region: Secondary | ICD-10-CM | POA: Diagnosis not present

## 2020-02-14 DIAGNOSIS — G629 Polyneuropathy, unspecified: Secondary | ICD-10-CM | POA: Diagnosis not present

## 2020-02-14 DIAGNOSIS — Z86010 Personal history of colonic polyps: Secondary | ICD-10-CM | POA: Diagnosis not present

## 2020-02-14 DIAGNOSIS — M189 Osteoarthritis of first carpometacarpal joint, unspecified: Secondary | ICD-10-CM | POA: Diagnosis not present

## 2020-02-14 DIAGNOSIS — J4540 Moderate persistent asthma, uncomplicated: Secondary | ICD-10-CM | POA: Diagnosis not present

## 2020-02-14 DIAGNOSIS — R61 Generalized hyperhidrosis: Secondary | ICD-10-CM | POA: Diagnosis not present

## 2020-02-14 DIAGNOSIS — K7689 Other specified diseases of liver: Secondary | ICD-10-CM | POA: Diagnosis not present

## 2020-02-14 DIAGNOSIS — Z825 Family history of asthma and other chronic lower respiratory diseases: Secondary | ICD-10-CM | POA: Diagnosis not present

## 2020-02-14 DIAGNOSIS — I480 Paroxysmal atrial fibrillation: Secondary | ICD-10-CM | POA: Diagnosis not present

## 2020-02-14 DIAGNOSIS — R52 Pain, unspecified: Secondary | ICD-10-CM | POA: Diagnosis not present

## 2020-02-14 DIAGNOSIS — M79606 Pain in leg, unspecified: Secondary | ICD-10-CM | POA: Diagnosis not present

## 2020-02-14 DIAGNOSIS — M5441 Lumbago with sciatica, right side: Secondary | ICD-10-CM | POA: Diagnosis not present

## 2020-02-14 DIAGNOSIS — N289 Disorder of kidney and ureter, unspecified: Secondary | ICD-10-CM | POA: Diagnosis not present

## 2020-02-14 DIAGNOSIS — R509 Fever, unspecified: Secondary | ICD-10-CM | POA: Diagnosis not present

## 2020-02-14 DIAGNOSIS — J449 Chronic obstructive pulmonary disease, unspecified: Secondary | ICD-10-CM | POA: Diagnosis not present

## 2020-02-14 DIAGNOSIS — K449 Diaphragmatic hernia without obstruction or gangrene: Secondary | ICD-10-CM | POA: Diagnosis not present

## 2020-02-14 DIAGNOSIS — E7849 Other hyperlipidemia: Secondary | ICD-10-CM | POA: Diagnosis not present

## 2020-02-14 DIAGNOSIS — M164 Bilateral post-traumatic osteoarthritis of hip: Secondary | ICD-10-CM | POA: Diagnosis not present

## 2020-02-14 DIAGNOSIS — I890 Lymphedema, not elsewhere classified: Secondary | ICD-10-CM | POA: Diagnosis not present

## 2020-02-14 DIAGNOSIS — Z01810 Encounter for preprocedural cardiovascular examination: Secondary | ICD-10-CM | POA: Diagnosis not present

## 2020-02-14 DIAGNOSIS — R9389 Abnormal findings on diagnostic imaging of other specified body structures: Secondary | ICD-10-CM | POA: Diagnosis not present

## 2020-02-14 DIAGNOSIS — R350 Frequency of micturition: Secondary | ICD-10-CM | POA: Diagnosis not present

## 2020-02-14 DIAGNOSIS — C3411 Malignant neoplasm of upper lobe, right bronchus or lung: Secondary | ICD-10-CM | POA: Diagnosis not present

## 2020-02-14 DIAGNOSIS — E079 Disorder of thyroid, unspecified: Secondary | ICD-10-CM | POA: Diagnosis not present

## 2020-02-14 DIAGNOSIS — R609 Edema, unspecified: Secondary | ICD-10-CM | POA: Diagnosis not present

## 2020-02-14 DIAGNOSIS — Z833 Family history of diabetes mellitus: Secondary | ICD-10-CM | POA: Diagnosis not present

## 2020-02-14 DIAGNOSIS — M1611 Unilateral primary osteoarthritis, right hip: Secondary | ICD-10-CM | POA: Diagnosis not present

## 2020-02-14 DIAGNOSIS — K621 Rectal polyp: Secondary | ICD-10-CM | POA: Diagnosis not present

## 2020-02-14 DIAGNOSIS — Z992 Dependence on renal dialysis: Secondary | ICD-10-CM | POA: Diagnosis not present

## 2020-02-14 DIAGNOSIS — I951 Orthostatic hypotension: Secondary | ICD-10-CM | POA: Diagnosis not present

## 2020-02-14 DIAGNOSIS — E2839 Other primary ovarian failure: Secondary | ICD-10-CM | POA: Diagnosis not present

## 2020-02-14 DIAGNOSIS — Z5321 Procedure and treatment not carried out due to patient leaving prior to being seen by health care provider: Secondary | ICD-10-CM | POA: Diagnosis not present

## 2020-02-14 DIAGNOSIS — Z48813 Encounter for surgical aftercare following surgery on the respiratory system: Secondary | ICD-10-CM | POA: Diagnosis not present

## 2020-02-14 DIAGNOSIS — I82402 Acute embolism and thrombosis of unspecified deep veins of left lower extremity: Secondary | ICD-10-CM | POA: Diagnosis not present

## 2020-02-14 DIAGNOSIS — I160 Hypertensive urgency: Secondary | ICD-10-CM | POA: Diagnosis not present

## 2020-02-14 DIAGNOSIS — J301 Allergic rhinitis due to pollen: Secondary | ICD-10-CM | POA: Diagnosis not present

## 2020-02-14 DIAGNOSIS — M546 Pain in thoracic spine: Secondary | ICD-10-CM | POA: Diagnosis not present

## 2020-02-14 DIAGNOSIS — R069 Unspecified abnormalities of breathing: Secondary | ICD-10-CM | POA: Diagnosis not present

## 2020-02-14 DIAGNOSIS — M65341 Trigger finger, right ring finger: Secondary | ICD-10-CM | POA: Diagnosis not present

## 2020-02-14 DIAGNOSIS — E569 Vitamin deficiency, unspecified: Secondary | ICD-10-CM | POA: Diagnosis not present

## 2020-02-14 DIAGNOSIS — M1612 Unilateral primary osteoarthritis, left hip: Secondary | ICD-10-CM | POA: Diagnosis not present

## 2020-02-14 DIAGNOSIS — Z794 Long term (current) use of insulin: Secondary | ICD-10-CM | POA: Diagnosis not present

## 2020-02-14 DIAGNOSIS — J432 Centrilobular emphysema: Secondary | ICD-10-CM | POA: Diagnosis not present

## 2020-02-14 DIAGNOSIS — S0990XA Unspecified injury of head, initial encounter: Secondary | ICD-10-CM | POA: Diagnosis not present

## 2020-02-14 DIAGNOSIS — K8020 Calculus of gallbladder without cholecystitis without obstruction: Secondary | ICD-10-CM | POA: Diagnosis not present

## 2020-02-14 DIAGNOSIS — S82261F Displaced segmental fracture of shaft of right tibia, subsequent encounter for open fracture type IIIA, IIIB, or IIIC with routine healing: Secondary | ICD-10-CM | POA: Diagnosis not present

## 2020-02-14 DIAGNOSIS — I5022 Chronic systolic (congestive) heart failure: Secondary | ICD-10-CM | POA: Diagnosis not present

## 2020-02-14 DIAGNOSIS — Z952 Presence of prosthetic heart valve: Secondary | ICD-10-CM | POA: Diagnosis not present

## 2020-02-14 DIAGNOSIS — I255 Ischemic cardiomyopathy: Secondary | ICD-10-CM | POA: Diagnosis not present

## 2020-02-14 DIAGNOSIS — R1310 Dysphagia, unspecified: Secondary | ICD-10-CM | POA: Diagnosis not present

## 2020-02-14 DIAGNOSIS — R278 Other lack of coordination: Secondary | ICD-10-CM | POA: Diagnosis not present

## 2020-02-14 DIAGNOSIS — N2889 Other specified disorders of kidney and ureter: Secondary | ICD-10-CM | POA: Diagnosis not present

## 2020-02-14 DIAGNOSIS — I2721 Secondary pulmonary arterial hypertension: Secondary | ICD-10-CM | POA: Diagnosis not present

## 2020-02-14 DIAGNOSIS — R29702 NIHSS score 2: Secondary | ICD-10-CM | POA: Diagnosis not present

## 2020-02-14 DIAGNOSIS — L97919 Non-pressure chronic ulcer of unspecified part of right lower leg with unspecified severity: Secondary | ICD-10-CM | POA: Diagnosis not present

## 2020-02-14 DIAGNOSIS — I5042 Chronic combined systolic (congestive) and diastolic (congestive) heart failure: Secondary | ICD-10-CM | POA: Diagnosis not present

## 2020-02-14 DIAGNOSIS — Z8701 Personal history of pneumonia (recurrent): Secondary | ICD-10-CM | POA: Diagnosis not present

## 2020-02-14 DIAGNOSIS — K921 Melena: Secondary | ICD-10-CM | POA: Diagnosis not present

## 2020-02-14 DIAGNOSIS — C50812 Malignant neoplasm of overlapping sites of left female breast: Secondary | ICD-10-CM | POA: Diagnosis not present

## 2020-02-14 DIAGNOSIS — D696 Thrombocytopenia, unspecified: Secondary | ICD-10-CM | POA: Diagnosis not present

## 2020-02-14 DIAGNOSIS — I5023 Acute on chronic systolic (congestive) heart failure: Secondary | ICD-10-CM | POA: Diagnosis not present

## 2020-02-14 DIAGNOSIS — R109 Unspecified abdominal pain: Secondary | ICD-10-CM | POA: Diagnosis not present

## 2020-02-14 DIAGNOSIS — R293 Abnormal posture: Secondary | ICD-10-CM | POA: Diagnosis not present

## 2020-02-14 DIAGNOSIS — M79602 Pain in left arm: Secondary | ICD-10-CM | POA: Diagnosis not present

## 2020-02-14 DIAGNOSIS — Z981 Arthrodesis status: Secondary | ICD-10-CM | POA: Diagnosis not present

## 2020-02-14 DIAGNOSIS — Z87891 Personal history of nicotine dependence: Secondary | ICD-10-CM | POA: Diagnosis not present

## 2020-02-14 DIAGNOSIS — L97822 Non-pressure chronic ulcer of other part of left lower leg with fat layer exposed: Secondary | ICD-10-CM | POA: Diagnosis not present

## 2020-02-14 DIAGNOSIS — G912 (Idiopathic) normal pressure hydrocephalus: Secondary | ICD-10-CM | POA: Diagnosis not present

## 2020-02-14 DIAGNOSIS — K828 Other specified diseases of gallbladder: Secondary | ICD-10-CM | POA: Diagnosis not present

## 2020-02-14 DIAGNOSIS — Z8546 Personal history of malignant neoplasm of prostate: Secondary | ICD-10-CM | POA: Diagnosis not present

## 2020-02-14 DIAGNOSIS — H2513 Age-related nuclear cataract, bilateral: Secondary | ICD-10-CM | POA: Diagnosis not present

## 2020-02-14 DIAGNOSIS — F0151 Vascular dementia with behavioral disturbance: Secondary | ICD-10-CM | POA: Diagnosis not present

## 2020-02-14 DIAGNOSIS — E038 Other specified hypothyroidism: Secondary | ICD-10-CM | POA: Diagnosis not present

## 2020-02-14 DIAGNOSIS — M25642 Stiffness of left hand, not elsewhere classified: Secondary | ICD-10-CM | POA: Diagnosis not present

## 2020-02-14 DIAGNOSIS — M542 Cervicalgia: Secondary | ICD-10-CM | POA: Diagnosis not present

## 2020-02-14 DIAGNOSIS — Z7951 Long term (current) use of inhaled steroids: Secondary | ICD-10-CM | POA: Diagnosis not present

## 2020-02-14 DIAGNOSIS — N816 Rectocele: Secondary | ICD-10-CM | POA: Diagnosis not present

## 2020-02-14 DIAGNOSIS — G8929 Other chronic pain: Secondary | ICD-10-CM | POA: Diagnosis not present

## 2020-02-14 DIAGNOSIS — R296 Repeated falls: Secondary | ICD-10-CM | POA: Diagnosis not present

## 2020-02-14 DIAGNOSIS — Z1211 Encounter for screening for malignant neoplasm of colon: Secondary | ICD-10-CM | POA: Diagnosis not present

## 2020-02-14 DIAGNOSIS — I129 Hypertensive chronic kidney disease with stage 1 through stage 4 chronic kidney disease, or unspecified chronic kidney disease: Secondary | ICD-10-CM | POA: Diagnosis not present

## 2020-02-14 DIAGNOSIS — D649 Anemia, unspecified: Secondary | ICD-10-CM | POA: Diagnosis not present

## 2020-02-14 DIAGNOSIS — Z8249 Family history of ischemic heart disease and other diseases of the circulatory system: Secondary | ICD-10-CM | POA: Diagnosis not present

## 2020-02-14 DIAGNOSIS — F322 Major depressive disorder, single episode, severe without psychotic features: Secondary | ICD-10-CM | POA: Diagnosis not present

## 2020-02-14 DIAGNOSIS — N3946 Mixed incontinence: Secondary | ICD-10-CM | POA: Diagnosis not present

## 2020-02-14 DIAGNOSIS — S299XXA Unspecified injury of thorax, initial encounter: Secondary | ICD-10-CM | POA: Diagnosis not present

## 2020-02-14 DIAGNOSIS — M170 Bilateral primary osteoarthritis of knee: Secondary | ICD-10-CM | POA: Diagnosis not present

## 2020-02-14 DIAGNOSIS — J8410 Pulmonary fibrosis, unspecified: Secondary | ICD-10-CM | POA: Diagnosis not present

## 2020-02-14 DIAGNOSIS — M48061 Spinal stenosis, lumbar region without neurogenic claudication: Secondary | ICD-10-CM | POA: Diagnosis not present

## 2020-02-14 DIAGNOSIS — R1311 Dysphagia, oral phase: Secondary | ICD-10-CM | POA: Diagnosis not present

## 2020-02-14 DIAGNOSIS — I444 Left anterior fascicular block: Secondary | ICD-10-CM | POA: Diagnosis not present

## 2020-02-14 DIAGNOSIS — Z1283 Encounter for screening for malignant neoplasm of skin: Secondary | ICD-10-CM | POA: Diagnosis not present

## 2020-02-14 DIAGNOSIS — M85851 Other specified disorders of bone density and structure, right thigh: Secondary | ICD-10-CM | POA: Diagnosis not present

## 2020-02-14 DIAGNOSIS — I83025 Varicose veins of left lower extremity with ulcer other part of foot: Secondary | ICD-10-CM | POA: Diagnosis not present

## 2020-02-14 DIAGNOSIS — R739 Hyperglycemia, unspecified: Secondary | ICD-10-CM | POA: Diagnosis not present

## 2020-02-14 DIAGNOSIS — R351 Nocturia: Secondary | ICD-10-CM | POA: Diagnosis not present

## 2020-02-14 DIAGNOSIS — N643 Galactorrhea not associated with childbirth: Secondary | ICD-10-CM | POA: Diagnosis not present

## 2020-02-14 DIAGNOSIS — C44121 Squamous cell carcinoma of skin of unspecified eyelid, including canthus: Secondary | ICD-10-CM | POA: Diagnosis not present

## 2020-02-14 DIAGNOSIS — H00016 Hordeolum externum left eye, unspecified eyelid: Secondary | ICD-10-CM | POA: Diagnosis not present

## 2020-02-14 DIAGNOSIS — R3915 Urgency of urination: Secondary | ICD-10-CM | POA: Diagnosis not present

## 2020-02-14 DIAGNOSIS — Z882 Allergy status to sulfonamides status: Secondary | ICD-10-CM | POA: Diagnosis not present

## 2020-02-14 DIAGNOSIS — L97812 Non-pressure chronic ulcer of other part of right lower leg with fat layer exposed: Secondary | ICD-10-CM | POA: Diagnosis not present

## 2020-02-14 DIAGNOSIS — I4581 Long QT syndrome: Secondary | ICD-10-CM | POA: Diagnosis not present

## 2020-02-14 DIAGNOSIS — M79645 Pain in left finger(s): Secondary | ICD-10-CM | POA: Diagnosis not present

## 2020-02-14 DIAGNOSIS — Z79890 Hormone replacement therapy (postmenopausal): Secondary | ICD-10-CM | POA: Diagnosis not present

## 2020-02-14 DIAGNOSIS — M24661 Ankylosis, right knee: Secondary | ICD-10-CM | POA: Diagnosis not present

## 2020-02-14 DIAGNOSIS — R279 Unspecified lack of coordination: Secondary | ICD-10-CM | POA: Diagnosis not present

## 2020-02-14 DIAGNOSIS — Z681 Body mass index (BMI) 19 or less, adult: Secondary | ICD-10-CM | POA: Diagnosis not present

## 2020-02-14 DIAGNOSIS — E43 Unspecified severe protein-calorie malnutrition: Secondary | ICD-10-CM | POA: Diagnosis not present

## 2020-02-14 DIAGNOSIS — R197 Diarrhea, unspecified: Secondary | ICD-10-CM | POA: Diagnosis not present

## 2020-02-14 DIAGNOSIS — S52122D Displaced fracture of head of left radius, subsequent encounter for closed fracture with routine healing: Secondary | ICD-10-CM | POA: Diagnosis not present

## 2020-02-14 DIAGNOSIS — S72114D Nondisplaced fracture of greater trochanter of right femur, subsequent encounter for closed fracture with routine healing: Secondary | ICD-10-CM | POA: Diagnosis not present

## 2020-02-14 DIAGNOSIS — Z923 Personal history of irradiation: Secondary | ICD-10-CM | POA: Diagnosis not present

## 2020-02-14 DIAGNOSIS — N8111 Cystocele, midline: Secondary | ICD-10-CM | POA: Diagnosis not present

## 2020-02-14 DIAGNOSIS — H409 Unspecified glaucoma: Secondary | ICD-10-CM | POA: Diagnosis not present

## 2020-02-14 DIAGNOSIS — J479 Bronchiectasis, uncomplicated: Secondary | ICD-10-CM | POA: Diagnosis not present

## 2020-02-14 DIAGNOSIS — R251 Tremor, unspecified: Secondary | ICD-10-CM | POA: Diagnosis not present

## 2020-02-14 DIAGNOSIS — H25813 Combined forms of age-related cataract, bilateral: Secondary | ICD-10-CM | POA: Diagnosis not present

## 2020-02-14 DIAGNOSIS — R29707 NIHSS score 7: Secondary | ICD-10-CM | POA: Diagnosis not present

## 2020-02-14 DIAGNOSIS — Q600 Renal agenesis, unilateral: Secondary | ICD-10-CM | POA: Diagnosis not present

## 2020-02-14 DIAGNOSIS — I482 Chronic atrial fibrillation, unspecified: Secondary | ICD-10-CM | POA: Diagnosis not present

## 2020-02-14 DIAGNOSIS — L89152 Pressure ulcer of sacral region, stage 2: Secondary | ICD-10-CM | POA: Diagnosis not present

## 2020-02-14 DIAGNOSIS — I87311 Chronic venous hypertension (idiopathic) with ulcer of right lower extremity: Secondary | ICD-10-CM | POA: Diagnosis not present

## 2020-02-14 DIAGNOSIS — Z800 Family history of malignant neoplasm of digestive organs: Secondary | ICD-10-CM | POA: Diagnosis not present

## 2020-02-14 DIAGNOSIS — R29703 NIHSS score 3: Secondary | ICD-10-CM | POA: Diagnosis not present

## 2020-02-14 DIAGNOSIS — F488 Other specified nonpsychotic mental disorders: Secondary | ICD-10-CM | POA: Diagnosis not present

## 2020-02-14 DIAGNOSIS — F17210 Nicotine dependence, cigarettes, uncomplicated: Secondary | ICD-10-CM | POA: Diagnosis not present

## 2020-02-14 DIAGNOSIS — G894 Chronic pain syndrome: Secondary | ICD-10-CM | POA: Diagnosis not present

## 2020-02-14 DIAGNOSIS — M159 Polyosteoarthritis, unspecified: Secondary | ICD-10-CM | POA: Diagnosis not present

## 2020-02-14 DIAGNOSIS — H8110 Benign paroxysmal vertigo, unspecified ear: Secondary | ICD-10-CM | POA: Diagnosis not present

## 2020-02-14 DIAGNOSIS — M6281 Muscle weakness (generalized): Secondary | ICD-10-CM | POA: Diagnosis not present

## 2020-02-14 DIAGNOSIS — F3341 Major depressive disorder, recurrent, in partial remission: Secondary | ICD-10-CM | POA: Diagnosis not present

## 2020-02-14 DIAGNOSIS — M6530 Trigger finger, unspecified finger: Secondary | ICD-10-CM | POA: Diagnosis not present

## 2020-02-14 DIAGNOSIS — M1712 Unilateral primary osteoarthritis, left knee: Secondary | ICD-10-CM | POA: Diagnosis not present

## 2020-02-14 DIAGNOSIS — M7061 Trochanteric bursitis, right hip: Secondary | ICD-10-CM | POA: Diagnosis not present

## 2020-02-14 DIAGNOSIS — H9191 Unspecified hearing loss, right ear: Secondary | ICD-10-CM | POA: Diagnosis not present

## 2020-02-14 DIAGNOSIS — R7401 Elevation of levels of liver transaminase levels: Secondary | ICD-10-CM | POA: Diagnosis not present

## 2020-02-14 DIAGNOSIS — G4733 Obstructive sleep apnea (adult) (pediatric): Secondary | ICD-10-CM | POA: Diagnosis not present

## 2020-02-14 DIAGNOSIS — H5203 Hypermetropia, bilateral: Secondary | ICD-10-CM | POA: Diagnosis not present

## 2020-02-14 DIAGNOSIS — Z955 Presence of coronary angioplasty implant and graft: Secondary | ICD-10-CM | POA: Diagnosis not present

## 2020-02-14 DIAGNOSIS — Z0000 Encounter for general adult medical examination without abnormal findings: Secondary | ICD-10-CM | POA: Diagnosis not present

## 2020-02-14 DIAGNOSIS — F29 Unspecified psychosis not due to a substance or known physiological condition: Secondary | ICD-10-CM | POA: Diagnosis not present

## 2020-02-14 DIAGNOSIS — Z136 Encounter for screening for cardiovascular disorders: Secondary | ICD-10-CM | POA: Diagnosis not present

## 2020-02-14 DIAGNOSIS — Z85038 Personal history of other malignant neoplasm of large intestine: Secondary | ICD-10-CM | POA: Diagnosis not present

## 2020-02-14 DIAGNOSIS — Z8042 Family history of malignant neoplasm of prostate: Secondary | ICD-10-CM | POA: Diagnosis not present

## 2020-02-14 DIAGNOSIS — E559 Vitamin D deficiency, unspecified: Secondary | ICD-10-CM | POA: Diagnosis not present

## 2020-02-14 DIAGNOSIS — I6789 Other cerebrovascular disease: Secondary | ICD-10-CM | POA: Diagnosis not present

## 2020-02-14 DIAGNOSIS — F5089 Other specified eating disorder: Secondary | ICD-10-CM | POA: Diagnosis not present

## 2020-02-14 DIAGNOSIS — C4491 Basal cell carcinoma of skin, unspecified: Secondary | ICD-10-CM | POA: Diagnosis not present

## 2020-02-14 DIAGNOSIS — E669 Obesity, unspecified: Secondary | ICD-10-CM | POA: Diagnosis not present

## 2020-02-14 DIAGNOSIS — N35011 Post-traumatic bulbous urethral stricture: Secondary | ICD-10-CM | POA: Diagnosis not present

## 2020-02-14 DIAGNOSIS — M8588 Other specified disorders of bone density and structure, other site: Secondary | ICD-10-CM | POA: Diagnosis not present

## 2020-02-14 DIAGNOSIS — L298 Other pruritus: Secondary | ICD-10-CM | POA: Diagnosis not present

## 2020-02-14 DIAGNOSIS — M4726 Other spondylosis with radiculopathy, lumbar region: Secondary | ICD-10-CM | POA: Diagnosis not present

## 2020-02-14 DIAGNOSIS — F429 Obsessive-compulsive disorder, unspecified: Secondary | ICD-10-CM | POA: Diagnosis not present

## 2020-02-14 DIAGNOSIS — C9210 Chronic myeloid leukemia, BCR/ABL-positive, not having achieved remission: Secondary | ICD-10-CM | POA: Diagnosis not present

## 2020-02-14 DIAGNOSIS — Z66 Do not resuscitate: Secondary | ICD-10-CM | POA: Diagnosis not present

## 2020-02-14 DIAGNOSIS — D631 Anemia in chronic kidney disease: Secondary | ICD-10-CM | POA: Diagnosis not present

## 2020-02-14 DIAGNOSIS — Z79891 Long term (current) use of opiate analgesic: Secondary | ICD-10-CM | POA: Diagnosis not present

## 2020-02-14 DIAGNOSIS — E0520 Thyrotoxicosis with toxic multinodular goiter without thyrotoxic crisis or storm: Secondary | ICD-10-CM | POA: Diagnosis not present

## 2020-02-14 DIAGNOSIS — I96 Gangrene, not elsewhere classified: Secondary | ICD-10-CM | POA: Diagnosis not present

## 2020-02-14 DIAGNOSIS — R7303 Prediabetes: Secondary | ICD-10-CM | POA: Diagnosis not present

## 2020-02-14 DIAGNOSIS — F0280 Dementia in other diseases classified elsewhere without behavioral disturbance: Secondary | ICD-10-CM | POA: Diagnosis not present

## 2020-02-14 DIAGNOSIS — L509 Urticaria, unspecified: Secondary | ICD-10-CM | POA: Diagnosis not present

## 2020-02-14 DIAGNOSIS — I4892 Unspecified atrial flutter: Secondary | ICD-10-CM | POA: Diagnosis not present

## 2020-02-14 DIAGNOSIS — Z6834 Body mass index (BMI) 34.0-34.9, adult: Secondary | ICD-10-CM | POA: Diagnosis not present

## 2020-02-14 DIAGNOSIS — Z9842 Cataract extraction status, left eye: Secondary | ICD-10-CM | POA: Diagnosis not present

## 2020-02-14 DIAGNOSIS — J45991 Cough variant asthma: Secondary | ICD-10-CM | POA: Diagnosis not present

## 2020-02-14 DIAGNOSIS — Z9049 Acquired absence of other specified parts of digestive tract: Secondary | ICD-10-CM | POA: Diagnosis not present

## 2020-02-14 DIAGNOSIS — Z9841 Cataract extraction status, right eye: Secondary | ICD-10-CM | POA: Diagnosis not present

## 2020-02-14 DIAGNOSIS — I4819 Other persistent atrial fibrillation: Secondary | ICD-10-CM | POA: Diagnosis not present

## 2020-02-14 DIAGNOSIS — C44311 Basal cell carcinoma of skin of nose: Secondary | ICD-10-CM | POA: Diagnosis not present

## 2020-02-14 DIAGNOSIS — M9905 Segmental and somatic dysfunction of pelvic region: Secondary | ICD-10-CM | POA: Diagnosis not present

## 2020-02-14 DIAGNOSIS — J45909 Unspecified asthma, uncomplicated: Secondary | ICD-10-CM | POA: Diagnosis not present

## 2020-02-14 DIAGNOSIS — M25651 Stiffness of right hip, not elsewhere classified: Secondary | ICD-10-CM | POA: Diagnosis not present

## 2020-02-14 DIAGNOSIS — C4441 Basal cell carcinoma of skin of scalp and neck: Secondary | ICD-10-CM | POA: Diagnosis not present

## 2020-02-14 DIAGNOSIS — I50810 Right heart failure, unspecified: Secondary | ICD-10-CM | POA: Diagnosis not present

## 2020-02-14 DIAGNOSIS — J440 Chronic obstructive pulmonary disease with acute lower respiratory infection: Secondary | ICD-10-CM | POA: Diagnosis not present

## 2020-02-14 DIAGNOSIS — K3189 Other diseases of stomach and duodenum: Secondary | ICD-10-CM | POA: Diagnosis not present

## 2020-02-14 DIAGNOSIS — G500 Trigeminal neuralgia: Secondary | ICD-10-CM | POA: Diagnosis not present

## 2020-02-14 DIAGNOSIS — D638 Anemia in other chronic diseases classified elsewhere: Secondary | ICD-10-CM | POA: Diagnosis not present

## 2020-02-14 DIAGNOSIS — I2582 Chronic total occlusion of coronary artery: Secondary | ICD-10-CM | POA: Diagnosis not present

## 2020-02-14 DIAGNOSIS — J984 Other disorders of lung: Secondary | ICD-10-CM | POA: Diagnosis not present

## 2020-02-14 DIAGNOSIS — I428 Other cardiomyopathies: Secondary | ICD-10-CM | POA: Diagnosis not present

## 2020-02-14 DIAGNOSIS — M160 Bilateral primary osteoarthritis of hip: Secondary | ICD-10-CM | POA: Diagnosis not present

## 2020-02-14 DIAGNOSIS — E278 Other specified disorders of adrenal gland: Secondary | ICD-10-CM | POA: Diagnosis not present

## 2020-02-14 DIAGNOSIS — Z171 Estrogen receptor negative status [ER-]: Secondary | ICD-10-CM | POA: Diagnosis not present

## 2020-02-14 DIAGNOSIS — Z1339 Encounter for screening examination for other mental health and behavioral disorders: Secondary | ICD-10-CM | POA: Diagnosis not present

## 2020-02-14 DIAGNOSIS — R1011 Right upper quadrant pain: Secondary | ICD-10-CM | POA: Diagnosis not present

## 2020-02-14 DIAGNOSIS — H2511 Age-related nuclear cataract, right eye: Secondary | ICD-10-CM | POA: Diagnosis not present

## 2020-02-14 DIAGNOSIS — C61 Malignant neoplasm of prostate: Secondary | ICD-10-CM | POA: Diagnosis not present

## 2020-02-14 DIAGNOSIS — G4730 Sleep apnea, unspecified: Secondary | ICD-10-CM | POA: Diagnosis not present

## 2020-02-14 DIAGNOSIS — Z950 Presence of cardiac pacemaker: Secondary | ICD-10-CM | POA: Diagnosis not present

## 2020-02-14 DIAGNOSIS — K317 Polyp of stomach and duodenum: Secondary | ICD-10-CM | POA: Diagnosis not present

## 2020-02-14 DIAGNOSIS — I25119 Atherosclerotic heart disease of native coronary artery with unspecified angina pectoris: Secondary | ICD-10-CM | POA: Diagnosis not present

## 2020-02-14 DIAGNOSIS — K50813 Crohn's disease of both small and large intestine with fistula: Secondary | ICD-10-CM | POA: Diagnosis not present

## 2020-02-14 DIAGNOSIS — K5190 Ulcerative colitis, unspecified, without complications: Secondary | ICD-10-CM | POA: Diagnosis not present

## 2020-02-14 DIAGNOSIS — I70201 Unspecified atherosclerosis of native arteries of extremities, right leg: Secondary | ICD-10-CM | POA: Diagnosis not present

## 2020-02-14 DIAGNOSIS — Z5111 Encounter for antineoplastic chemotherapy: Secondary | ICD-10-CM | POA: Diagnosis not present

## 2020-02-14 DIAGNOSIS — M5030 Other cervical disc degeneration, unspecified cervical region: Secondary | ICD-10-CM | POA: Diagnosis not present

## 2020-02-14 DIAGNOSIS — E729 Disorder of amino-acid metabolism, unspecified: Secondary | ICD-10-CM | POA: Diagnosis not present

## 2020-02-14 DIAGNOSIS — I340 Nonrheumatic mitral (valve) insufficiency: Secondary | ICD-10-CM | POA: Diagnosis not present

## 2020-02-14 DIAGNOSIS — R4702 Dysphasia: Secondary | ICD-10-CM | POA: Diagnosis not present

## 2020-02-14 DIAGNOSIS — Z20828 Contact with and (suspected) exposure to other viral communicable diseases: Secondary | ICD-10-CM | POA: Diagnosis not present

## 2020-02-14 DIAGNOSIS — J679 Hypersensitivity pneumonitis due to unspecified organic dust: Secondary | ICD-10-CM | POA: Diagnosis not present

## 2020-02-14 DIAGNOSIS — G309 Alzheimer's disease, unspecified: Secondary | ICD-10-CM | POA: Diagnosis not present

## 2020-02-14 DIAGNOSIS — F4323 Adjustment disorder with mixed anxiety and depressed mood: Secondary | ICD-10-CM | POA: Diagnosis not present

## 2020-02-14 DIAGNOSIS — M25521 Pain in right elbow: Secondary | ICD-10-CM | POA: Diagnosis not present

## 2020-02-14 DIAGNOSIS — E538 Deficiency of other specified B group vitamins: Secondary | ICD-10-CM | POA: Diagnosis not present

## 2020-02-14 DIAGNOSIS — M1A0710 Idiopathic chronic gout, right ankle and foot, without tophus (tophi): Secondary | ICD-10-CM | POA: Diagnosis not present

## 2020-02-14 DIAGNOSIS — I0981 Rheumatic heart failure: Secondary | ICD-10-CM | POA: Diagnosis not present

## 2020-02-14 DIAGNOSIS — F0150 Vascular dementia without behavioral disturbance: Secondary | ICD-10-CM | POA: Diagnosis not present

## 2020-02-14 DIAGNOSIS — I501 Left ventricular failure: Secondary | ICD-10-CM | POA: Diagnosis not present

## 2020-02-14 DIAGNOSIS — H6123 Impacted cerumen, bilateral: Secondary | ICD-10-CM | POA: Diagnosis not present

## 2020-02-14 DIAGNOSIS — M4802 Spinal stenosis, cervical region: Secondary | ICD-10-CM | POA: Diagnosis not present

## 2020-02-14 DIAGNOSIS — F5101 Primary insomnia: Secondary | ICD-10-CM | POA: Diagnosis not present

## 2020-02-14 DIAGNOSIS — I6523 Occlusion and stenosis of bilateral carotid arteries: Secondary | ICD-10-CM | POA: Diagnosis not present

## 2020-02-14 DIAGNOSIS — H6983 Other specified disorders of Eustachian tube, bilateral: Secondary | ICD-10-CM | POA: Diagnosis not present

## 2020-02-14 DIAGNOSIS — N3941 Urge incontinence: Secondary | ICD-10-CM | POA: Diagnosis not present

## 2020-02-14 DIAGNOSIS — E7800 Pure hypercholesterolemia, unspecified: Secondary | ICD-10-CM | POA: Diagnosis not present

## 2020-02-14 DIAGNOSIS — M79675 Pain in left toe(s): Secondary | ICD-10-CM | POA: Diagnosis not present

## 2020-02-14 DIAGNOSIS — Z9981 Dependence on supplemental oxygen: Secondary | ICD-10-CM | POA: Diagnosis not present

## 2020-02-14 DIAGNOSIS — Z7182 Exercise counseling: Secondary | ICD-10-CM | POA: Diagnosis not present

## 2020-02-14 DIAGNOSIS — R456 Violent behavior: Secondary | ICD-10-CM | POA: Diagnosis not present

## 2020-02-14 DIAGNOSIS — E118 Type 2 diabetes mellitus with unspecified complications: Secondary | ICD-10-CM | POA: Diagnosis not present

## 2020-02-14 DIAGNOSIS — L03114 Cellulitis of left upper limb: Secondary | ICD-10-CM | POA: Diagnosis not present

## 2020-02-14 DIAGNOSIS — S82201M Unspecified fracture of shaft of right tibia, subsequent encounter for open fracture type I or II with nonunion: Secondary | ICD-10-CM | POA: Diagnosis not present

## 2020-02-14 DIAGNOSIS — J019 Acute sinusitis, unspecified: Secondary | ICD-10-CM | POA: Diagnosis not present

## 2020-02-14 DIAGNOSIS — I69154 Hemiplegia and hemiparesis following nontraumatic intracerebral hemorrhage affecting left non-dominant side: Secondary | ICD-10-CM | POA: Diagnosis not present

## 2020-02-14 DIAGNOSIS — Z5112 Encounter for antineoplastic immunotherapy: Secondary | ICD-10-CM | POA: Diagnosis not present

## 2020-02-14 DIAGNOSIS — Z96642 Presence of left artificial hip joint: Secondary | ICD-10-CM | POA: Diagnosis not present

## 2020-02-14 DIAGNOSIS — R519 Headache, unspecified: Secondary | ICD-10-CM | POA: Diagnosis not present

## 2020-02-14 DIAGNOSIS — H259 Unspecified age-related cataract: Secondary | ICD-10-CM | POA: Diagnosis not present

## 2020-02-14 DIAGNOSIS — G479 Sleep disorder, unspecified: Secondary | ICD-10-CM | POA: Diagnosis not present

## 2020-02-14 DIAGNOSIS — R29701 NIHSS score 1: Secondary | ICD-10-CM | POA: Diagnosis not present

## 2020-02-14 DIAGNOSIS — M1A9XX1 Chronic gout, unspecified, with tophus (tophi): Secondary | ICD-10-CM | POA: Diagnosis not present

## 2020-02-14 DIAGNOSIS — K760 Fatty (change of) liver, not elsewhere classified: Secondary | ICD-10-CM | POA: Diagnosis not present

## 2020-02-14 DIAGNOSIS — I77811 Abdominal aortic ectasia: Secondary | ICD-10-CM | POA: Diagnosis not present

## 2020-02-14 DIAGNOSIS — R4182 Altered mental status, unspecified: Secondary | ICD-10-CM | POA: Diagnosis not present

## 2020-02-14 DIAGNOSIS — M5126 Other intervertebral disc displacement, lumbar region: Secondary | ICD-10-CM | POA: Diagnosis not present

## 2020-02-14 DIAGNOSIS — S199XXA Unspecified injury of neck, initial encounter: Secondary | ICD-10-CM | POA: Diagnosis not present

## 2020-02-14 DIAGNOSIS — I110 Hypertensive heart disease with heart failure: Secondary | ICD-10-CM | POA: Diagnosis not present

## 2020-02-14 DIAGNOSIS — Z96651 Presence of right artificial knee joint: Secondary | ICD-10-CM | POA: Diagnosis not present

## 2020-02-14 DIAGNOSIS — N184 Chronic kidney disease, stage 4 (severe): Secondary | ICD-10-CM | POA: Diagnosis not present

## 2020-02-14 DIAGNOSIS — Z4509 Encounter for adjustment and management of other cardiac device: Secondary | ICD-10-CM | POA: Diagnosis not present

## 2020-02-14 DIAGNOSIS — Z466 Encounter for fitting and adjustment of urinary device: Secondary | ICD-10-CM | POA: Diagnosis not present

## 2020-02-14 DIAGNOSIS — M7062 Trochanteric bursitis, left hip: Secondary | ICD-10-CM | POA: Diagnosis not present

## 2020-02-14 DIAGNOSIS — R253 Fasciculation: Secondary | ICD-10-CM | POA: Diagnosis not present

## 2020-02-14 DIAGNOSIS — E1140 Type 2 diabetes mellitus with diabetic neuropathy, unspecified: Secondary | ICD-10-CM | POA: Diagnosis not present

## 2020-02-14 DIAGNOSIS — N838 Other noninflammatory disorders of ovary, fallopian tube and broad ligament: Secondary | ICD-10-CM | POA: Diagnosis not present

## 2020-02-14 DIAGNOSIS — E1169 Type 2 diabetes mellitus with other specified complication: Secondary | ICD-10-CM | POA: Diagnosis not present

## 2020-02-14 DIAGNOSIS — K5901 Slow transit constipation: Secondary | ICD-10-CM | POA: Diagnosis not present

## 2020-02-14 DIAGNOSIS — U071 COVID-19: Secondary | ICD-10-CM | POA: Diagnosis not present

## 2020-02-14 DIAGNOSIS — R7309 Other abnormal glucose: Secondary | ICD-10-CM | POA: Diagnosis not present

## 2020-02-14 DIAGNOSIS — N3010 Interstitial cystitis (chronic) without hematuria: Secondary | ICD-10-CM | POA: Diagnosis not present

## 2020-02-14 DIAGNOSIS — Z993 Dependence on wheelchair: Secondary | ICD-10-CM | POA: Diagnosis not present

## 2020-02-14 DIAGNOSIS — N319 Neuromuscular dysfunction of bladder, unspecified: Secondary | ICD-10-CM | POA: Diagnosis not present

## 2020-02-14 DIAGNOSIS — I672 Cerebral atherosclerosis: Secondary | ICD-10-CM | POA: Diagnosis not present

## 2020-02-14 DIAGNOSIS — M7060 Trochanteric bursitis, unspecified hip: Secondary | ICD-10-CM | POA: Diagnosis not present

## 2020-02-14 DIAGNOSIS — D500 Iron deficiency anemia secondary to blood loss (chronic): Secondary | ICD-10-CM | POA: Diagnosis not present

## 2020-02-14 DIAGNOSIS — Z801 Family history of malignant neoplasm of trachea, bronchus and lung: Secondary | ICD-10-CM | POA: Diagnosis not present

## 2020-02-14 DIAGNOSIS — G308 Other Alzheimer's disease: Secondary | ICD-10-CM | POA: Diagnosis not present

## 2020-02-14 DIAGNOSIS — Z87440 Personal history of urinary (tract) infections: Secondary | ICD-10-CM | POA: Diagnosis not present

## 2020-02-14 DIAGNOSIS — J4530 Mild persistent asthma, uncomplicated: Secondary | ICD-10-CM | POA: Diagnosis not present

## 2020-02-14 DIAGNOSIS — M76899 Other specified enthesopathies of unspecified lower limb, excluding foot: Secondary | ICD-10-CM | POA: Diagnosis not present

## 2020-02-14 DIAGNOSIS — C44111 Basal cell carcinoma of skin of unspecified eyelid, including canthus: Secondary | ICD-10-CM | POA: Diagnosis not present

## 2020-02-14 DIAGNOSIS — K22719 Barrett's esophagus with dysplasia, unspecified: Secondary | ICD-10-CM | POA: Diagnosis not present

## 2020-02-14 DIAGNOSIS — L718 Other rosacea: Secondary | ICD-10-CM | POA: Diagnosis not present

## 2020-02-14 DIAGNOSIS — M797 Fibromyalgia: Secondary | ICD-10-CM | POA: Diagnosis not present

## 2020-02-14 DIAGNOSIS — I639 Cerebral infarction, unspecified: Secondary | ICD-10-CM | POA: Diagnosis not present

## 2020-02-14 DIAGNOSIS — I2583 Coronary atherosclerosis due to lipid rich plaque: Secondary | ICD-10-CM | POA: Diagnosis not present

## 2020-02-14 DIAGNOSIS — J309 Allergic rhinitis, unspecified: Secondary | ICD-10-CM | POA: Diagnosis not present

## 2020-02-14 DIAGNOSIS — I69322 Dysarthria following cerebral infarction: Secondary | ICD-10-CM | POA: Diagnosis not present

## 2020-02-14 DIAGNOSIS — I700 Atherosclerosis of aorta: Secondary | ICD-10-CM | POA: Diagnosis not present

## 2020-02-14 DIAGNOSIS — Z48812 Encounter for surgical aftercare following surgery on the circulatory system: Secondary | ICD-10-CM | POA: Diagnosis not present

## 2020-02-14 DIAGNOSIS — B379 Candidiasis, unspecified: Secondary | ICD-10-CM | POA: Diagnosis not present

## 2020-02-14 DIAGNOSIS — S0121XD Laceration without foreign body of nose, subsequent encounter: Secondary | ICD-10-CM | POA: Diagnosis not present

## 2020-02-14 DIAGNOSIS — S59901A Unspecified injury of right elbow, initial encounter: Secondary | ICD-10-CM | POA: Diagnosis not present

## 2020-02-14 DIAGNOSIS — R079 Chest pain, unspecified: Secondary | ICD-10-CM | POA: Diagnosis not present

## 2020-02-14 DIAGNOSIS — I63512 Cerebral infarction due to unspecified occlusion or stenosis of left middle cerebral artery: Secondary | ICD-10-CM | POA: Diagnosis not present

## 2020-02-14 DIAGNOSIS — K648 Other hemorrhoids: Secondary | ICD-10-CM | POA: Diagnosis not present

## 2020-02-14 DIAGNOSIS — C189 Malignant neoplasm of colon, unspecified: Secondary | ICD-10-CM | POA: Diagnosis not present

## 2020-02-14 DIAGNOSIS — I421 Obstructive hypertrophic cardiomyopathy: Secondary | ICD-10-CM | POA: Diagnosis not present

## 2020-02-14 DIAGNOSIS — H35443 Age-related reticular degeneration of retina, bilateral: Secondary | ICD-10-CM | POA: Diagnosis not present

## 2020-02-14 DIAGNOSIS — B3781 Candidal esophagitis: Secondary | ICD-10-CM | POA: Diagnosis not present

## 2020-02-14 DIAGNOSIS — I69354 Hemiplegia and hemiparesis following cerebral infarction affecting left non-dominant side: Secondary | ICD-10-CM | POA: Diagnosis not present

## 2020-02-14 DIAGNOSIS — E8881 Metabolic syndrome: Secondary | ICD-10-CM | POA: Diagnosis not present

## 2020-02-14 DIAGNOSIS — E119 Type 2 diabetes mellitus without complications: Secondary | ICD-10-CM | POA: Diagnosis not present

## 2020-02-14 DIAGNOSIS — C538 Malignant neoplasm of overlapping sites of cervix uteri: Secondary | ICD-10-CM | POA: Diagnosis not present

## 2020-02-14 DIAGNOSIS — F319 Bipolar disorder, unspecified: Secondary | ICD-10-CM | POA: Diagnosis not present

## 2020-02-14 DIAGNOSIS — Z792 Long term (current) use of antibiotics: Secondary | ICD-10-CM | POA: Diagnosis not present

## 2020-02-14 DIAGNOSIS — T421X1A Poisoning by iminostilbenes, accidental (unintentional), initial encounter: Secondary | ICD-10-CM | POA: Diagnosis not present

## 2020-02-14 DIAGNOSIS — Z01812 Encounter for preprocedural laboratory examination: Secondary | ICD-10-CM | POA: Diagnosis not present

## 2020-02-14 DIAGNOSIS — K219 Gastro-esophageal reflux disease without esophagitis: Secondary | ICD-10-CM | POA: Diagnosis not present

## 2020-02-14 DIAGNOSIS — R001 Bradycardia, unspecified: Secondary | ICD-10-CM | POA: Diagnosis not present

## 2020-02-14 DIAGNOSIS — M80052A Age-related osteoporosis with current pathological fracture, left femur, initial encounter for fracture: Secondary | ICD-10-CM | POA: Diagnosis not present

## 2020-02-14 DIAGNOSIS — I441 Atrioventricular block, second degree: Secondary | ICD-10-CM | POA: Diagnosis not present

## 2020-02-14 DIAGNOSIS — F1010 Alcohol abuse, uncomplicated: Secondary | ICD-10-CM | POA: Diagnosis not present

## 2020-02-14 DIAGNOSIS — Z881 Allergy status to other antibiotic agents status: Secondary | ICD-10-CM | POA: Diagnosis not present

## 2020-02-14 DIAGNOSIS — Z90710 Acquired absence of both cervix and uterus: Secondary | ICD-10-CM | POA: Diagnosis not present

## 2020-02-14 DIAGNOSIS — H9313 Tinnitus, bilateral: Secondary | ICD-10-CM | POA: Diagnosis not present

## 2020-02-14 DIAGNOSIS — Z7901 Long term (current) use of anticoagulants: Secondary | ICD-10-CM | POA: Diagnosis not present

## 2020-02-14 DIAGNOSIS — F0391 Unspecified dementia with behavioral disturbance: Secondary | ICD-10-CM | POA: Diagnosis not present

## 2020-02-14 DIAGNOSIS — G252 Other specified forms of tremor: Secondary | ICD-10-CM | POA: Diagnosis not present

## 2020-02-14 DIAGNOSIS — R7989 Other specified abnormal findings of blood chemistry: Secondary | ICD-10-CM | POA: Diagnosis not present

## 2020-02-14 DIAGNOSIS — Z85118 Personal history of other malignant neoplasm of bronchus and lung: Secondary | ICD-10-CM | POA: Diagnosis not present

## 2020-02-14 DIAGNOSIS — M47812 Spondylosis without myelopathy or radiculopathy, cervical region: Secondary | ICD-10-CM | POA: Diagnosis not present

## 2020-02-14 DIAGNOSIS — H903 Sensorineural hearing loss, bilateral: Secondary | ICD-10-CM | POA: Diagnosis not present

## 2020-02-14 DIAGNOSIS — Z01419 Encounter for gynecological examination (general) (routine) without abnormal findings: Secondary | ICD-10-CM | POA: Diagnosis not present

## 2020-02-14 DIAGNOSIS — Z7952 Long term (current) use of systemic steroids: Secondary | ICD-10-CM | POA: Diagnosis not present

## 2020-02-14 DIAGNOSIS — E539 Vitamin B deficiency, unspecified: Secondary | ICD-10-CM | POA: Diagnosis not present

## 2020-02-14 DIAGNOSIS — R5383 Other fatigue: Secondary | ICD-10-CM | POA: Diagnosis not present

## 2020-02-14 DIAGNOSIS — D329 Benign neoplasm of meninges, unspecified: Secondary | ICD-10-CM | POA: Diagnosis not present

## 2020-02-14 DIAGNOSIS — R000 Tachycardia, unspecified: Secondary | ICD-10-CM | POA: Diagnosis not present

## 2020-02-14 DIAGNOSIS — J441 Chronic obstructive pulmonary disease with (acute) exacerbation: Secondary | ICD-10-CM | POA: Diagnosis not present

## 2020-02-14 DIAGNOSIS — F329 Major depressive disorder, single episode, unspecified: Secondary | ICD-10-CM | POA: Diagnosis not present

## 2020-02-14 DIAGNOSIS — D510 Vitamin B12 deficiency anemia due to intrinsic factor deficiency: Secondary | ICD-10-CM | POA: Diagnosis not present

## 2020-02-14 DIAGNOSIS — E1151 Type 2 diabetes mellitus with diabetic peripheral angiopathy without gangrene: Secondary | ICD-10-CM | POA: Diagnosis not present

## 2020-02-14 DIAGNOSIS — L949 Localized connective tissue disorder, unspecified: Secondary | ICD-10-CM | POA: Diagnosis not present

## 2020-02-14 DIAGNOSIS — S41101D Unspecified open wound of right upper arm, subsequent encounter: Secondary | ICD-10-CM | POA: Diagnosis not present

## 2020-02-14 DIAGNOSIS — Z820 Family history of epilepsy and other diseases of the nervous system: Secondary | ICD-10-CM | POA: Diagnosis not present

## 2020-02-14 DIAGNOSIS — E1165 Type 2 diabetes mellitus with hyperglycemia: Secondary | ICD-10-CM | POA: Diagnosis not present

## 2020-02-14 DIAGNOSIS — M479 Spondylosis, unspecified: Secondary | ICD-10-CM | POA: Diagnosis not present

## 2020-02-14 DIAGNOSIS — M47892 Other spondylosis, cervical region: Secondary | ICD-10-CM | POA: Diagnosis not present

## 2020-02-14 DIAGNOSIS — M19049 Primary osteoarthritis, unspecified hand: Secondary | ICD-10-CM | POA: Diagnosis not present

## 2020-02-14 DIAGNOSIS — K5130 Ulcerative (chronic) rectosigmoiditis without complications: Secondary | ICD-10-CM | POA: Diagnosis not present

## 2020-02-14 DIAGNOSIS — Z86711 Personal history of pulmonary embolism: Secondary | ICD-10-CM | POA: Diagnosis not present

## 2020-02-14 DIAGNOSIS — I63422 Cerebral infarction due to embolism of left anterior cerebral artery: Secondary | ICD-10-CM | POA: Diagnosis not present

## 2020-02-14 DIAGNOSIS — J439 Emphysema, unspecified: Secondary | ICD-10-CM | POA: Diagnosis not present

## 2020-02-14 DIAGNOSIS — N281 Cyst of kidney, acquired: Secondary | ICD-10-CM | POA: Diagnosis not present

## 2020-02-14 DIAGNOSIS — Z96643 Presence of artificial hip joint, bilateral: Secondary | ICD-10-CM | POA: Diagnosis not present

## 2020-02-14 DIAGNOSIS — F321 Major depressive disorder, single episode, moderate: Secondary | ICD-10-CM | POA: Diagnosis not present

## 2020-02-14 DIAGNOSIS — L508 Other urticaria: Secondary | ICD-10-CM | POA: Diagnosis not present

## 2020-02-14 DIAGNOSIS — L578 Other skin changes due to chronic exposure to nonionizing radiation: Secondary | ICD-10-CM | POA: Diagnosis not present

## 2020-02-14 DIAGNOSIS — Z9989 Dependence on other enabling machines and devices: Secondary | ICD-10-CM | POA: Diagnosis not present

## 2020-02-14 DIAGNOSIS — C7989 Secondary malignant neoplasm of other specified sites: Secondary | ICD-10-CM | POA: Diagnosis not present

## 2020-02-14 DIAGNOSIS — G119 Hereditary ataxia, unspecified: Secondary | ICD-10-CM | POA: Diagnosis not present

## 2020-02-14 DIAGNOSIS — I493 Ventricular premature depolarization: Secondary | ICD-10-CM | POA: Diagnosis not present

## 2020-02-14 DIAGNOSIS — K429 Umbilical hernia without obstruction or gangrene: Secondary | ICD-10-CM | POA: Diagnosis not present

## 2020-02-14 DIAGNOSIS — M85852 Other specified disorders of bone density and structure, left thigh: Secondary | ICD-10-CM | POA: Diagnosis not present

## 2020-02-14 DIAGNOSIS — K625 Hemorrhage of anus and rectum: Secondary | ICD-10-CM | POA: Diagnosis not present

## 2020-02-14 DIAGNOSIS — Z6839 Body mass index (BMI) 39.0-39.9, adult: Secondary | ICD-10-CM | POA: Diagnosis not present

## 2020-02-14 DIAGNOSIS — R0789 Other chest pain: Secondary | ICD-10-CM | POA: Diagnosis not present

## 2020-02-14 DIAGNOSIS — D126 Benign neoplasm of colon, unspecified: Secondary | ICD-10-CM | POA: Diagnosis not present

## 2020-02-14 DIAGNOSIS — I472 Ventricular tachycardia: Secondary | ICD-10-CM | POA: Diagnosis not present

## 2020-02-14 DIAGNOSIS — Z89611 Acquired absence of right leg above knee: Secondary | ICD-10-CM | POA: Diagnosis not present

## 2020-02-14 DIAGNOSIS — B965 Pseudomonas (aeruginosa) (mallei) (pseudomallei) as the cause of diseases classified elsewhere: Secondary | ICD-10-CM | POA: Diagnosis not present

## 2020-02-14 DIAGNOSIS — J470 Bronchiectasis with acute lower respiratory infection: Secondary | ICD-10-CM | POA: Diagnosis not present

## 2020-02-14 DIAGNOSIS — Z1159 Encounter for screening for other viral diseases: Secondary | ICD-10-CM | POA: Diagnosis not present

## 2020-02-14 DIAGNOSIS — G44309 Post-traumatic headache, unspecified, not intractable: Secondary | ICD-10-CM | POA: Diagnosis not present

## 2020-02-14 DIAGNOSIS — N2581 Secondary hyperparathyroidism of renal origin: Secondary | ICD-10-CM | POA: Diagnosis not present

## 2020-02-14 DIAGNOSIS — C07 Malignant neoplasm of parotid gland: Secondary | ICD-10-CM | POA: Diagnosis not present

## 2020-02-14 DIAGNOSIS — K5909 Other constipation: Secondary | ICD-10-CM | POA: Diagnosis not present

## 2020-02-14 DIAGNOSIS — R918 Other nonspecific abnormal finding of lung field: Secondary | ICD-10-CM | POA: Diagnosis not present

## 2020-02-14 DIAGNOSIS — D2362 Other benign neoplasm of skin of left upper limb, including shoulder: Secondary | ICD-10-CM | POA: Diagnosis not present

## 2020-02-14 DIAGNOSIS — R4701 Aphasia: Secondary | ICD-10-CM | POA: Diagnosis not present

## 2020-02-15 DIAGNOSIS — D0512 Intraductal carcinoma in situ of left breast: Secondary | ICD-10-CM | POA: Diagnosis not present

## 2020-02-15 DIAGNOSIS — R1084 Generalized abdominal pain: Secondary | ICD-10-CM | POA: Diagnosis not present

## 2020-02-15 DIAGNOSIS — M9904 Segmental and somatic dysfunction of sacral region: Secondary | ICD-10-CM | POA: Diagnosis not present

## 2020-02-15 DIAGNOSIS — E871 Hypo-osmolality and hyponatremia: Secondary | ICD-10-CM | POA: Diagnosis not present

## 2020-02-15 DIAGNOSIS — M48062 Spinal stenosis, lumbar region with neurogenic claudication: Secondary | ICD-10-CM | POA: Diagnosis not present

## 2020-02-15 DIAGNOSIS — Z13820 Encounter for screening for osteoporosis: Secondary | ICD-10-CM | POA: Diagnosis not present

## 2020-02-15 DIAGNOSIS — Z8049 Family history of malignant neoplasm of other genital organs: Secondary | ICD-10-CM | POA: Diagnosis not present

## 2020-02-15 DIAGNOSIS — L718 Other rosacea: Secondary | ICD-10-CM | POA: Diagnosis not present

## 2020-02-15 DIAGNOSIS — I7090 Unspecified atherosclerosis: Secondary | ICD-10-CM | POA: Diagnosis not present

## 2020-02-15 DIAGNOSIS — C44219 Basal cell carcinoma of skin of left ear and external auricular canal: Secondary | ICD-10-CM | POA: Diagnosis not present

## 2020-02-15 DIAGNOSIS — H2511 Age-related nuclear cataract, right eye: Secondary | ICD-10-CM | POA: Diagnosis not present

## 2020-02-15 DIAGNOSIS — R52 Pain, unspecified: Secondary | ICD-10-CM | POA: Diagnosis not present

## 2020-02-15 DIAGNOSIS — F320 Major depressive disorder, single episode, mild: Secondary | ICD-10-CM | POA: Diagnosis not present

## 2020-02-15 DIAGNOSIS — K551 Chronic vascular disorders of intestine: Secondary | ICD-10-CM | POA: Diagnosis not present

## 2020-02-15 DIAGNOSIS — K5720 Diverticulitis of large intestine with perforation and abscess without bleeding: Secondary | ICD-10-CM | POA: Diagnosis not present

## 2020-02-15 DIAGNOSIS — H9190 Unspecified hearing loss, unspecified ear: Secondary | ICD-10-CM | POA: Diagnosis not present

## 2020-02-15 DIAGNOSIS — H2589 Other age-related cataract: Secondary | ICD-10-CM | POA: Diagnosis not present

## 2020-02-15 DIAGNOSIS — Z8551 Personal history of malignant neoplasm of bladder: Secondary | ICD-10-CM | POA: Diagnosis not present

## 2020-02-15 DIAGNOSIS — I471 Supraventricular tachycardia: Secondary | ICD-10-CM | POA: Diagnosis not present

## 2020-02-15 DIAGNOSIS — N951 Menopausal and female climacteric states: Secondary | ICD-10-CM | POA: Diagnosis not present

## 2020-02-15 DIAGNOSIS — R946 Abnormal results of thyroid function studies: Secondary | ICD-10-CM | POA: Diagnosis not present

## 2020-02-15 DIAGNOSIS — M25511 Pain in right shoulder: Secondary | ICD-10-CM | POA: Diagnosis not present

## 2020-02-15 DIAGNOSIS — I350 Nonrheumatic aortic (valve) stenosis: Secondary | ICD-10-CM | POA: Diagnosis not present

## 2020-02-15 DIAGNOSIS — I351 Nonrheumatic aortic (valve) insufficiency: Secondary | ICD-10-CM | POA: Diagnosis not present

## 2020-02-15 DIAGNOSIS — N186 End stage renal disease: Secondary | ICD-10-CM | POA: Diagnosis not present

## 2020-02-15 DIAGNOSIS — N184 Chronic kidney disease, stage 4 (severe): Secondary | ICD-10-CM | POA: Diagnosis not present

## 2020-02-15 DIAGNOSIS — N1832 Chronic kidney disease, stage 3b: Secondary | ICD-10-CM | POA: Diagnosis not present

## 2020-02-15 DIAGNOSIS — F3132 Bipolar disorder, current episode depressed, moderate: Secondary | ICD-10-CM | POA: Diagnosis not present

## 2020-02-15 DIAGNOSIS — Z7901 Long term (current) use of anticoagulants: Secondary | ICD-10-CM | POA: Diagnosis not present

## 2020-02-15 DIAGNOSIS — L989 Disorder of the skin and subcutaneous tissue, unspecified: Secondary | ICD-10-CM | POA: Diagnosis not present

## 2020-02-15 DIAGNOSIS — Z8546 Personal history of malignant neoplasm of prostate: Secondary | ICD-10-CM | POA: Diagnosis not present

## 2020-02-15 DIAGNOSIS — R5382 Chronic fatigue, unspecified: Secondary | ICD-10-CM | POA: Diagnosis not present

## 2020-02-15 DIAGNOSIS — J4520 Mild intermittent asthma, uncomplicated: Secondary | ICD-10-CM | POA: Diagnosis not present

## 2020-02-15 DIAGNOSIS — G40909 Epilepsy, unspecified, not intractable, without status epilepticus: Secondary | ICD-10-CM | POA: Diagnosis not present

## 2020-02-15 DIAGNOSIS — Z794 Long term (current) use of insulin: Secondary | ICD-10-CM | POA: Diagnosis not present

## 2020-02-15 DIAGNOSIS — F5104 Psychophysiologic insomnia: Secondary | ICD-10-CM | POA: Diagnosis not present

## 2020-02-15 DIAGNOSIS — N3281 Overactive bladder: Secondary | ICD-10-CM | POA: Diagnosis not present

## 2020-02-15 DIAGNOSIS — N400 Enlarged prostate without lower urinary tract symptoms: Secondary | ICD-10-CM | POA: Diagnosis not present

## 2020-02-15 DIAGNOSIS — M810 Age-related osteoporosis without current pathological fracture: Secondary | ICD-10-CM | POA: Diagnosis not present

## 2020-02-15 DIAGNOSIS — N202 Calculus of kidney with calculus of ureter: Secondary | ICD-10-CM | POA: Diagnosis not present

## 2020-02-15 DIAGNOSIS — Z6828 Body mass index (BMI) 28.0-28.9, adult: Secondary | ICD-10-CM | POA: Diagnosis not present

## 2020-02-15 DIAGNOSIS — M48061 Spinal stenosis, lumbar region without neurogenic claudication: Secondary | ICD-10-CM | POA: Diagnosis not present

## 2020-02-15 DIAGNOSIS — R7989 Other specified abnormal findings of blood chemistry: Secondary | ICD-10-CM | POA: Diagnosis not present

## 2020-02-15 DIAGNOSIS — R262 Difficulty in walking, not elsewhere classified: Secondary | ICD-10-CM | POA: Diagnosis not present

## 2020-02-15 DIAGNOSIS — R4189 Other symptoms and signs involving cognitive functions and awareness: Secondary | ICD-10-CM | POA: Diagnosis not present

## 2020-02-15 DIAGNOSIS — C9000 Multiple myeloma not having achieved remission: Secondary | ICD-10-CM | POA: Diagnosis not present

## 2020-02-15 DIAGNOSIS — L97522 Non-pressure chronic ulcer of other part of left foot with fat layer exposed: Secondary | ICD-10-CM | POA: Diagnosis not present

## 2020-02-15 DIAGNOSIS — R062 Wheezing: Secondary | ICD-10-CM | POA: Diagnosis not present

## 2020-02-15 DIAGNOSIS — I69154 Hemiplegia and hemiparesis following nontraumatic intracerebral hemorrhage affecting left non-dominant side: Secondary | ICD-10-CM | POA: Diagnosis not present

## 2020-02-15 DIAGNOSIS — S0990XA Unspecified injury of head, initial encounter: Secondary | ICD-10-CM | POA: Diagnosis not present

## 2020-02-15 DIAGNOSIS — H5203 Hypermetropia, bilateral: Secondary | ICD-10-CM | POA: Diagnosis not present

## 2020-02-15 DIAGNOSIS — Z170 Estrogen receptor positive status [ER+]: Secondary | ICD-10-CM | POA: Diagnosis not present

## 2020-02-15 DIAGNOSIS — M1712 Unilateral primary osteoarthritis, left knee: Secondary | ICD-10-CM | POA: Diagnosis not present

## 2020-02-15 DIAGNOSIS — M47816 Spondylosis without myelopathy or radiculopathy, lumbar region: Secondary | ICD-10-CM | POA: Diagnosis not present

## 2020-02-15 DIAGNOSIS — D631 Anemia in chronic kidney disease: Secondary | ICD-10-CM | POA: Diagnosis not present

## 2020-02-15 DIAGNOSIS — N3090 Cystitis, unspecified without hematuria: Secondary | ICD-10-CM | POA: Diagnosis not present

## 2020-02-15 DIAGNOSIS — M25552 Pain in left hip: Secondary | ICD-10-CM | POA: Diagnosis not present

## 2020-02-15 DIAGNOSIS — R002 Palpitations: Secondary | ICD-10-CM | POA: Diagnosis not present

## 2020-02-15 DIAGNOSIS — W19XXXA Unspecified fall, initial encounter: Secondary | ICD-10-CM | POA: Diagnosis not present

## 2020-02-15 DIAGNOSIS — Z1321 Encounter for screening for nutritional disorder: Secondary | ICD-10-CM | POA: Diagnosis not present

## 2020-02-15 DIAGNOSIS — Z0100 Encounter for examination of eyes and vision without abnormal findings: Secondary | ICD-10-CM | POA: Diagnosis not present

## 2020-02-15 DIAGNOSIS — I25810 Atherosclerosis of coronary artery bypass graft(s) without angina pectoris: Secondary | ICD-10-CM | POA: Diagnosis not present

## 2020-02-15 DIAGNOSIS — Z95810 Presence of automatic (implantable) cardiac defibrillator: Secondary | ICD-10-CM | POA: Diagnosis not present

## 2020-02-15 DIAGNOSIS — D649 Anemia, unspecified: Secondary | ICD-10-CM | POA: Diagnosis not present

## 2020-02-15 DIAGNOSIS — M159 Polyosteoarthritis, unspecified: Secondary | ICD-10-CM | POA: Diagnosis not present

## 2020-02-15 DIAGNOSIS — N3946 Mixed incontinence: Secondary | ICD-10-CM | POA: Diagnosis not present

## 2020-02-15 DIAGNOSIS — T782XXS Anaphylactic shock, unspecified, sequela: Secondary | ICD-10-CM | POA: Diagnosis not present

## 2020-02-15 DIAGNOSIS — Z809 Family history of malignant neoplasm, unspecified: Secondary | ICD-10-CM | POA: Diagnosis not present

## 2020-02-15 DIAGNOSIS — K861 Other chronic pancreatitis: Secondary | ICD-10-CM | POA: Diagnosis not present

## 2020-02-15 DIAGNOSIS — I10 Essential (primary) hypertension: Secondary | ICD-10-CM | POA: Diagnosis not present

## 2020-02-15 DIAGNOSIS — R0989 Other specified symptoms and signs involving the circulatory and respiratory systems: Secondary | ICD-10-CM | POA: Diagnosis not present

## 2020-02-15 DIAGNOSIS — R441 Visual hallucinations: Secondary | ICD-10-CM | POA: Diagnosis not present

## 2020-02-15 DIAGNOSIS — M7061 Trochanteric bursitis, right hip: Secondary | ICD-10-CM | POA: Diagnosis not present

## 2020-02-15 DIAGNOSIS — E519 Thiamine deficiency, unspecified: Secondary | ICD-10-CM | POA: Diagnosis not present

## 2020-02-15 DIAGNOSIS — I6521 Occlusion and stenosis of right carotid artery: Secondary | ICD-10-CM | POA: Diagnosis not present

## 2020-02-15 DIAGNOSIS — D123 Benign neoplasm of transverse colon: Secondary | ICD-10-CM | POA: Diagnosis not present

## 2020-02-15 DIAGNOSIS — K635 Polyp of colon: Secondary | ICD-10-CM | POA: Diagnosis not present

## 2020-02-15 DIAGNOSIS — X32XXXA Exposure to sunlight, initial encounter: Secondary | ICD-10-CM | POA: Diagnosis not present

## 2020-02-15 DIAGNOSIS — N23 Unspecified renal colic: Secondary | ICD-10-CM | POA: Diagnosis not present

## 2020-02-15 DIAGNOSIS — G629 Polyneuropathy, unspecified: Secondary | ICD-10-CM | POA: Diagnosis not present

## 2020-02-15 DIAGNOSIS — Z969 Presence of functional implant, unspecified: Secondary | ICD-10-CM | POA: Diagnosis not present

## 2020-02-15 DIAGNOSIS — M80032S Age-related osteoporosis with current pathological fracture, left forearm, sequela: Secondary | ICD-10-CM | POA: Diagnosis not present

## 2020-02-15 DIAGNOSIS — D485 Neoplasm of uncertain behavior of skin: Secondary | ICD-10-CM | POA: Diagnosis not present

## 2020-02-15 DIAGNOSIS — Z122 Encounter for screening for malignant neoplasm of respiratory organs: Secondary | ICD-10-CM | POA: Diagnosis not present

## 2020-02-15 DIAGNOSIS — M7062 Trochanteric bursitis, left hip: Secondary | ICD-10-CM | POA: Diagnosis not present

## 2020-02-15 DIAGNOSIS — K319 Disease of stomach and duodenum, unspecified: Secondary | ICD-10-CM | POA: Diagnosis not present

## 2020-02-15 DIAGNOSIS — C50811 Malignant neoplasm of overlapping sites of right female breast: Secondary | ICD-10-CM | POA: Diagnosis not present

## 2020-02-15 DIAGNOSIS — Z881 Allergy status to other antibiotic agents status: Secondary | ICD-10-CM | POA: Diagnosis not present

## 2020-02-15 DIAGNOSIS — K5790 Diverticulosis of intestine, part unspecified, without perforation or abscess without bleeding: Secondary | ICD-10-CM | POA: Diagnosis not present

## 2020-02-15 DIAGNOSIS — F3176 Bipolar disorder, in full remission, most recent episode depressed: Secondary | ICD-10-CM | POA: Diagnosis not present

## 2020-02-15 DIAGNOSIS — E559 Vitamin D deficiency, unspecified: Secondary | ICD-10-CM | POA: Diagnosis not present

## 2020-02-15 DIAGNOSIS — E7849 Other hyperlipidemia: Secondary | ICD-10-CM | POA: Diagnosis not present

## 2020-02-15 DIAGNOSIS — I8311 Varicose veins of right lower extremity with inflammation: Secondary | ICD-10-CM | POA: Diagnosis not present

## 2020-02-15 DIAGNOSIS — I4891 Unspecified atrial fibrillation: Secondary | ICD-10-CM | POA: Diagnosis not present

## 2020-02-15 DIAGNOSIS — D519 Vitamin B12 deficiency anemia, unspecified: Secondary | ICD-10-CM | POA: Diagnosis not present

## 2020-02-15 DIAGNOSIS — E1122 Type 2 diabetes mellitus with diabetic chronic kidney disease: Secondary | ICD-10-CM | POA: Diagnosis not present

## 2020-02-15 DIAGNOSIS — B9689 Other specified bacterial agents as the cause of diseases classified elsewhere: Secondary | ICD-10-CM | POA: Diagnosis not present

## 2020-02-15 DIAGNOSIS — M80052D Age-related osteoporosis with current pathological fracture, left femur, subsequent encounter for fracture with routine healing: Secondary | ICD-10-CM | POA: Diagnosis not present

## 2020-02-15 DIAGNOSIS — D508 Other iron deficiency anemias: Secondary | ICD-10-CM | POA: Diagnosis not present

## 2020-02-15 DIAGNOSIS — G4701 Insomnia due to medical condition: Secondary | ICD-10-CM | POA: Diagnosis not present

## 2020-02-15 DIAGNOSIS — Z125 Encounter for screening for malignant neoplasm of prostate: Secondary | ICD-10-CM | POA: Diagnosis not present

## 2020-02-15 DIAGNOSIS — Z96653 Presence of artificial knee joint, bilateral: Secondary | ICD-10-CM | POA: Diagnosis not present

## 2020-02-15 DIAGNOSIS — D440 Neoplasm of uncertain behavior of thyroid gland: Secondary | ICD-10-CM | POA: Diagnosis not present

## 2020-02-15 DIAGNOSIS — C182 Malignant neoplasm of ascending colon: Secondary | ICD-10-CM | POA: Diagnosis not present

## 2020-02-15 DIAGNOSIS — R5381 Other malaise: Secondary | ICD-10-CM | POA: Diagnosis not present

## 2020-02-15 DIAGNOSIS — Z01810 Encounter for preprocedural cardiovascular examination: Secondary | ICD-10-CM | POA: Diagnosis not present

## 2020-02-15 DIAGNOSIS — M6284 Sarcopenia: Secondary | ICD-10-CM | POA: Diagnosis not present

## 2020-02-15 DIAGNOSIS — J3089 Other allergic rhinitis: Secondary | ICD-10-CM | POA: Diagnosis not present

## 2020-02-15 DIAGNOSIS — Z7982 Long term (current) use of aspirin: Secondary | ICD-10-CM | POA: Diagnosis not present

## 2020-02-15 DIAGNOSIS — J441 Chronic obstructive pulmonary disease with (acute) exacerbation: Secondary | ICD-10-CM | POA: Diagnosis not present

## 2020-02-15 DIAGNOSIS — E1121 Type 2 diabetes mellitus with diabetic nephropathy: Secondary | ICD-10-CM | POA: Diagnosis not present

## 2020-02-15 DIAGNOSIS — K580 Irritable bowel syndrome with diarrhea: Secondary | ICD-10-CM | POA: Diagnosis not present

## 2020-02-15 DIAGNOSIS — R079 Chest pain, unspecified: Secondary | ICD-10-CM | POA: Diagnosis not present

## 2020-02-15 DIAGNOSIS — Z806 Family history of leukemia: Secondary | ICD-10-CM | POA: Diagnosis not present

## 2020-02-15 DIAGNOSIS — L578 Other skin changes due to chronic exposure to nonionizing radiation: Secondary | ICD-10-CM | POA: Diagnosis not present

## 2020-02-15 DIAGNOSIS — I208 Other forms of angina pectoris: Secondary | ICD-10-CM | POA: Diagnosis not present

## 2020-02-15 DIAGNOSIS — K7460 Unspecified cirrhosis of liver: Secondary | ICD-10-CM | POA: Diagnosis not present

## 2020-02-15 DIAGNOSIS — E440 Moderate protein-calorie malnutrition: Secondary | ICD-10-CM | POA: Diagnosis not present

## 2020-02-15 DIAGNOSIS — Z79890 Hormone replacement therapy (postmenopausal): Secondary | ICD-10-CM | POA: Diagnosis not present

## 2020-02-15 DIAGNOSIS — M4802 Spinal stenosis, cervical region: Secondary | ICD-10-CM | POA: Diagnosis not present

## 2020-02-15 DIAGNOSIS — K5909 Other constipation: Secondary | ICD-10-CM | POA: Diagnosis not present

## 2020-02-15 DIAGNOSIS — I442 Atrioventricular block, complete: Secondary | ICD-10-CM | POA: Diagnosis not present

## 2020-02-15 DIAGNOSIS — M25512 Pain in left shoulder: Secondary | ICD-10-CM | POA: Diagnosis not present

## 2020-02-15 DIAGNOSIS — S32502A Unspecified fracture of left pubis, initial encounter for closed fracture: Secondary | ICD-10-CM | POA: Diagnosis not present

## 2020-02-15 DIAGNOSIS — N401 Enlarged prostate with lower urinary tract symptoms: Secondary | ICD-10-CM | POA: Diagnosis not present

## 2020-02-15 DIAGNOSIS — F332 Major depressive disorder, recurrent severe without psychotic features: Secondary | ICD-10-CM | POA: Diagnosis not present

## 2020-02-15 DIAGNOSIS — H524 Presbyopia: Secondary | ICD-10-CM | POA: Diagnosis not present

## 2020-02-15 DIAGNOSIS — K4090 Unilateral inguinal hernia, without obstruction or gangrene, not specified as recurrent: Secondary | ICD-10-CM | POA: Diagnosis not present

## 2020-02-15 DIAGNOSIS — H3093 Unspecified chorioretinal inflammation, bilateral: Secondary | ICD-10-CM | POA: Diagnosis not present

## 2020-02-15 DIAGNOSIS — S52201D Unspecified fracture of shaft of right ulna, subsequent encounter for closed fracture with routine healing: Secondary | ICD-10-CM | POA: Diagnosis not present

## 2020-02-15 DIAGNOSIS — E612 Magnesium deficiency: Secondary | ICD-10-CM | POA: Diagnosis not present

## 2020-02-15 DIAGNOSIS — R110 Nausea: Secondary | ICD-10-CM | POA: Diagnosis not present

## 2020-02-15 DIAGNOSIS — B1910 Unspecified viral hepatitis B without hepatic coma: Secondary | ICD-10-CM | POA: Diagnosis not present

## 2020-02-15 DIAGNOSIS — R1032 Left lower quadrant pain: Secondary | ICD-10-CM | POA: Diagnosis not present

## 2020-02-15 DIAGNOSIS — H43811 Vitreous degeneration, right eye: Secondary | ICD-10-CM | POA: Diagnosis not present

## 2020-02-15 DIAGNOSIS — K649 Unspecified hemorrhoids: Secondary | ICD-10-CM | POA: Diagnosis not present

## 2020-02-15 DIAGNOSIS — M0570 Rheumatoid arthritis with rheumatoid factor of unspecified site without organ or systems involvement: Secondary | ICD-10-CM | POA: Diagnosis not present

## 2020-02-15 DIAGNOSIS — R12 Heartburn: Secondary | ICD-10-CM | POA: Diagnosis not present

## 2020-02-15 DIAGNOSIS — J383 Other diseases of vocal cords: Secondary | ICD-10-CM | POA: Diagnosis not present

## 2020-02-15 DIAGNOSIS — M329 Systemic lupus erythematosus, unspecified: Secondary | ICD-10-CM | POA: Diagnosis not present

## 2020-02-15 DIAGNOSIS — R609 Edema, unspecified: Secondary | ICD-10-CM | POA: Diagnosis not present

## 2020-02-15 DIAGNOSIS — G251 Drug-induced tremor: Secondary | ICD-10-CM | POA: Diagnosis not present

## 2020-02-15 DIAGNOSIS — Z006 Encounter for examination for normal comparison and control in clinical research program: Secondary | ICD-10-CM | POA: Diagnosis not present

## 2020-02-15 DIAGNOSIS — R2689 Other abnormalities of gait and mobility: Secondary | ICD-10-CM | POA: Diagnosis not present

## 2020-02-15 DIAGNOSIS — Z681 Body mass index (BMI) 19 or less, adult: Secondary | ICD-10-CM | POA: Diagnosis not present

## 2020-02-15 DIAGNOSIS — G8918 Other acute postprocedural pain: Secondary | ICD-10-CM | POA: Diagnosis not present

## 2020-02-15 DIAGNOSIS — D62 Acute posthemorrhagic anemia: Secondary | ICD-10-CM | POA: Diagnosis not present

## 2020-02-15 DIAGNOSIS — D0511 Intraductal carcinoma in situ of right breast: Secondary | ICD-10-CM | POA: Diagnosis not present

## 2020-02-15 DIAGNOSIS — E0500 Thyrotoxicosis with diffuse goiter without thyrotoxic crisis or storm: Secondary | ICD-10-CM | POA: Diagnosis not present

## 2020-02-15 DIAGNOSIS — Z6835 Body mass index (BMI) 35.0-35.9, adult: Secondary | ICD-10-CM | POA: Diagnosis not present

## 2020-02-15 DIAGNOSIS — J45909 Unspecified asthma, uncomplicated: Secondary | ICD-10-CM | POA: Diagnosis not present

## 2020-02-15 DIAGNOSIS — I719 Aortic aneurysm of unspecified site, without rupture: Secondary | ICD-10-CM | POA: Diagnosis not present

## 2020-02-15 DIAGNOSIS — E890 Postprocedural hypothyroidism: Secondary | ICD-10-CM | POA: Diagnosis not present

## 2020-02-15 DIAGNOSIS — K529 Noninfective gastroenteritis and colitis, unspecified: Secondary | ICD-10-CM | POA: Diagnosis not present

## 2020-02-15 DIAGNOSIS — J309 Allergic rhinitis, unspecified: Secondary | ICD-10-CM | POA: Diagnosis not present

## 2020-02-15 DIAGNOSIS — F0280 Dementia in other diseases classified elsewhere without behavioral disturbance: Secondary | ICD-10-CM | POA: Diagnosis not present

## 2020-02-15 DIAGNOSIS — S41101D Unspecified open wound of right upper arm, subsequent encounter: Secondary | ICD-10-CM | POA: Diagnosis not present

## 2020-02-15 DIAGNOSIS — M4726 Other spondylosis with radiculopathy, lumbar region: Secondary | ICD-10-CM | POA: Diagnosis not present

## 2020-02-15 DIAGNOSIS — M19049 Primary osteoarthritis, unspecified hand: Secondary | ICD-10-CM | POA: Diagnosis not present

## 2020-02-15 DIAGNOSIS — J432 Centrilobular emphysema: Secondary | ICD-10-CM | POA: Diagnosis not present

## 2020-02-15 DIAGNOSIS — Z6833 Body mass index (BMI) 33.0-33.9, adult: Secondary | ICD-10-CM | POA: Diagnosis not present

## 2020-02-15 DIAGNOSIS — H547 Unspecified visual loss: Secondary | ICD-10-CM | POA: Diagnosis not present

## 2020-02-15 DIAGNOSIS — Z09 Encounter for follow-up examination after completed treatment for conditions other than malignant neoplasm: Secondary | ICD-10-CM | POA: Diagnosis not present

## 2020-02-15 DIAGNOSIS — E1165 Type 2 diabetes mellitus with hyperglycemia: Secondary | ICD-10-CM | POA: Diagnosis not present

## 2020-02-15 DIAGNOSIS — M5430 Sciatica, unspecified side: Secondary | ICD-10-CM | POA: Diagnosis not present

## 2020-02-15 DIAGNOSIS — E0590 Thyrotoxicosis, unspecified without thyrotoxic crisis or storm: Secondary | ICD-10-CM | POA: Diagnosis not present

## 2020-02-15 DIAGNOSIS — M546 Pain in thoracic spine: Secondary | ICD-10-CM | POA: Diagnosis not present

## 2020-02-15 DIAGNOSIS — Z961 Presence of intraocular lens: Secondary | ICD-10-CM | POA: Diagnosis not present

## 2020-02-15 DIAGNOSIS — E1129 Type 2 diabetes mellitus with other diabetic kidney complication: Secondary | ICD-10-CM | POA: Diagnosis not present

## 2020-02-15 DIAGNOSIS — M75112 Incomplete rotator cuff tear or rupture of left shoulder, not specified as traumatic: Secondary | ICD-10-CM | POA: Diagnosis not present

## 2020-02-15 DIAGNOSIS — R748 Abnormal levels of other serum enzymes: Secondary | ICD-10-CM | POA: Diagnosis not present

## 2020-02-15 DIAGNOSIS — L97519 Non-pressure chronic ulcer of other part of right foot with unspecified severity: Secondary | ICD-10-CM | POA: Diagnosis not present

## 2020-02-15 DIAGNOSIS — F329 Major depressive disorder, single episode, unspecified: Secondary | ICD-10-CM | POA: Diagnosis not present

## 2020-02-15 DIAGNOSIS — B9620 Unspecified Escherichia coli [E. coli] as the cause of diseases classified elsewhere: Secondary | ICD-10-CM | POA: Diagnosis not present

## 2020-02-15 DIAGNOSIS — Z1231 Encounter for screening mammogram for malignant neoplasm of breast: Secondary | ICD-10-CM | POA: Diagnosis not present

## 2020-02-15 DIAGNOSIS — Z7952 Long term (current) use of systemic steroids: Secondary | ICD-10-CM | POA: Diagnosis not present

## 2020-02-15 DIAGNOSIS — K5150 Left sided colitis without complications: Secondary | ICD-10-CM | POA: Diagnosis not present

## 2020-02-15 DIAGNOSIS — F321 Major depressive disorder, single episode, moderate: Secondary | ICD-10-CM | POA: Diagnosis not present

## 2020-02-15 DIAGNOSIS — M5136 Other intervertebral disc degeneration, lumbar region: Secondary | ICD-10-CM | POA: Diagnosis not present

## 2020-02-15 DIAGNOSIS — Z803 Family history of malignant neoplasm of breast: Secondary | ICD-10-CM | POA: Diagnosis not present

## 2020-02-15 DIAGNOSIS — Z888 Allergy status to other drugs, medicaments and biological substances status: Secondary | ICD-10-CM | POA: Diagnosis not present

## 2020-02-15 DIAGNOSIS — G8911 Acute pain due to trauma: Secondary | ICD-10-CM | POA: Diagnosis not present

## 2020-02-15 DIAGNOSIS — R197 Diarrhea, unspecified: Secondary | ICD-10-CM | POA: Diagnosis not present

## 2020-02-15 DIAGNOSIS — F0150 Vascular dementia without behavioral disturbance: Secondary | ICD-10-CM | POA: Diagnosis not present

## 2020-02-15 DIAGNOSIS — E785 Hyperlipidemia, unspecified: Secondary | ICD-10-CM | POA: Diagnosis not present

## 2020-02-15 DIAGNOSIS — E1149 Type 2 diabetes mellitus with other diabetic neurological complication: Secondary | ICD-10-CM | POA: Diagnosis not present

## 2020-02-15 DIAGNOSIS — M75122 Complete rotator cuff tear or rupture of left shoulder, not specified as traumatic: Secondary | ICD-10-CM | POA: Diagnosis not present

## 2020-02-15 DIAGNOSIS — G459 Transient cerebral ischemic attack, unspecified: Secondary | ICD-10-CM | POA: Diagnosis not present

## 2020-02-15 DIAGNOSIS — J3081 Allergic rhinitis due to animal (cat) (dog) hair and dander: Secondary | ICD-10-CM | POA: Diagnosis not present

## 2020-02-15 DIAGNOSIS — M791 Myalgia, unspecified site: Secondary | ICD-10-CM | POA: Diagnosis not present

## 2020-02-15 DIAGNOSIS — L300 Nummular dermatitis: Secondary | ICD-10-CM | POA: Diagnosis not present

## 2020-02-15 DIAGNOSIS — I130 Hypertensive heart and chronic kidney disease with heart failure and stage 1 through stage 4 chronic kidney disease, or unspecified chronic kidney disease: Secondary | ICD-10-CM | POA: Diagnosis not present

## 2020-02-15 DIAGNOSIS — Z8616 Personal history of COVID-19: Secondary | ICD-10-CM | POA: Diagnosis not present

## 2020-02-15 DIAGNOSIS — F331 Major depressive disorder, recurrent, moderate: Secondary | ICD-10-CM | POA: Diagnosis not present

## 2020-02-15 DIAGNOSIS — I69354 Hemiplegia and hemiparesis following cerebral infarction affecting left non-dominant side: Secondary | ICD-10-CM | POA: Diagnosis not present

## 2020-02-15 DIAGNOSIS — R972 Elevated prostate specific antigen [PSA]: Secondary | ICD-10-CM | POA: Diagnosis not present

## 2020-02-15 DIAGNOSIS — E7801 Familial hypercholesterolemia: Secondary | ICD-10-CM | POA: Diagnosis not present

## 2020-02-15 DIAGNOSIS — M79644 Pain in right finger(s): Secondary | ICD-10-CM | POA: Diagnosis not present

## 2020-02-15 DIAGNOSIS — F339 Major depressive disorder, recurrent, unspecified: Secondary | ICD-10-CM | POA: Diagnosis not present

## 2020-02-15 DIAGNOSIS — G43909 Migraine, unspecified, not intractable, without status migrainosus: Secondary | ICD-10-CM | POA: Diagnosis not present

## 2020-02-15 DIAGNOSIS — Z13220 Encounter for screening for lipoid disorders: Secondary | ICD-10-CM | POA: Diagnosis not present

## 2020-02-15 DIAGNOSIS — Z716 Tobacco abuse counseling: Secondary | ICD-10-CM | POA: Diagnosis not present

## 2020-02-15 DIAGNOSIS — B1920 Unspecified viral hepatitis C without hepatic coma: Secondary | ICD-10-CM | POA: Diagnosis not present

## 2020-02-15 DIAGNOSIS — M1711 Unilateral primary osteoarthritis, right knee: Secondary | ICD-10-CM | POA: Diagnosis not present

## 2020-02-15 DIAGNOSIS — M62830 Muscle spasm of back: Secondary | ICD-10-CM | POA: Diagnosis not present

## 2020-02-15 DIAGNOSIS — F17200 Nicotine dependence, unspecified, uncomplicated: Secondary | ICD-10-CM | POA: Diagnosis not present

## 2020-02-15 DIAGNOSIS — L0390 Cellulitis, unspecified: Secondary | ICD-10-CM | POA: Diagnosis not present

## 2020-02-15 DIAGNOSIS — R413 Other amnesia: Secondary | ICD-10-CM | POA: Diagnosis not present

## 2020-02-15 DIAGNOSIS — M779 Enthesopathy, unspecified: Secondary | ICD-10-CM | POA: Diagnosis not present

## 2020-02-15 DIAGNOSIS — Z808 Family history of malignant neoplasm of other organs or systems: Secondary | ICD-10-CM | POA: Diagnosis not present

## 2020-02-15 DIAGNOSIS — J45901 Unspecified asthma with (acute) exacerbation: Secondary | ICD-10-CM | POA: Diagnosis not present

## 2020-02-15 DIAGNOSIS — M8730 Other secondary osteonecrosis, unspecified bone: Secondary | ICD-10-CM | POA: Diagnosis not present

## 2020-02-15 DIAGNOSIS — R519 Headache, unspecified: Secondary | ICD-10-CM | POA: Diagnosis not present

## 2020-02-15 DIAGNOSIS — I25118 Atherosclerotic heart disease of native coronary artery with other forms of angina pectoris: Secondary | ICD-10-CM | POA: Diagnosis not present

## 2020-02-15 DIAGNOSIS — R300 Dysuria: Secondary | ICD-10-CM | POA: Diagnosis not present

## 2020-02-15 DIAGNOSIS — M86171 Other acute osteomyelitis, right ankle and foot: Secondary | ICD-10-CM | POA: Diagnosis not present

## 2020-02-15 DIAGNOSIS — M272 Inflammatory conditions of jaws: Secondary | ICD-10-CM | POA: Diagnosis not present

## 2020-02-15 DIAGNOSIS — A09 Infectious gastroenteritis and colitis, unspecified: Secondary | ICD-10-CM | POA: Diagnosis not present

## 2020-02-15 DIAGNOSIS — H02834 Dermatochalasis of left upper eyelid: Secondary | ICD-10-CM | POA: Diagnosis not present

## 2020-02-15 DIAGNOSIS — H353211 Exudative age-related macular degeneration, right eye, with active choroidal neovascularization: Secondary | ICD-10-CM | POA: Diagnosis not present

## 2020-02-15 DIAGNOSIS — I455 Other specified heart block: Secondary | ICD-10-CM | POA: Diagnosis not present

## 2020-02-15 DIAGNOSIS — R531 Weakness: Secondary | ICD-10-CM | POA: Diagnosis not present

## 2020-02-15 DIAGNOSIS — M2041 Other hammer toe(s) (acquired), right foot: Secondary | ICD-10-CM | POA: Diagnosis not present

## 2020-02-15 DIAGNOSIS — M7661 Achilles tendinitis, right leg: Secondary | ICD-10-CM | POA: Diagnosis not present

## 2020-02-15 DIAGNOSIS — N824 Other female intestinal-genital tract fistulae: Secondary | ICD-10-CM | POA: Diagnosis not present

## 2020-02-15 DIAGNOSIS — S39012A Strain of muscle, fascia and tendon of lower back, initial encounter: Secondary | ICD-10-CM | POA: Diagnosis not present

## 2020-02-15 DIAGNOSIS — M25351 Other instability, right hip: Secondary | ICD-10-CM | POA: Diagnosis not present

## 2020-02-15 DIAGNOSIS — G20 Parkinson's disease: Secondary | ICD-10-CM | POA: Diagnosis not present

## 2020-02-15 DIAGNOSIS — I059 Rheumatic mitral valve disease, unspecified: Secondary | ICD-10-CM | POA: Diagnosis not present

## 2020-02-15 DIAGNOSIS — M19039 Primary osteoarthritis, unspecified wrist: Secondary | ICD-10-CM | POA: Diagnosis not present

## 2020-02-15 DIAGNOSIS — G3184 Mild cognitive impairment, so stated: Secondary | ICD-10-CM | POA: Diagnosis not present

## 2020-02-15 DIAGNOSIS — M160 Bilateral primary osteoarthritis of hip: Secondary | ICD-10-CM | POA: Diagnosis not present

## 2020-02-15 DIAGNOSIS — Z0000 Encounter for general adult medical examination without abnormal findings: Secondary | ICD-10-CM | POA: Diagnosis not present

## 2020-02-15 DIAGNOSIS — S41112D Laceration without foreign body of left upper arm, subsequent encounter: Secondary | ICD-10-CM | POA: Diagnosis not present

## 2020-02-15 DIAGNOSIS — Z85528 Personal history of other malignant neoplasm of kidney: Secondary | ICD-10-CM | POA: Diagnosis not present

## 2020-02-15 DIAGNOSIS — R296 Repeated falls: Secondary | ICD-10-CM | POA: Diagnosis not present

## 2020-02-15 DIAGNOSIS — Z96643 Presence of artificial hip joint, bilateral: Secondary | ICD-10-CM | POA: Diagnosis not present

## 2020-02-15 DIAGNOSIS — I70209 Unspecified atherosclerosis of native arteries of extremities, unspecified extremity: Secondary | ICD-10-CM | POA: Diagnosis not present

## 2020-02-15 DIAGNOSIS — E872 Acidosis: Secondary | ICD-10-CM | POA: Diagnosis not present

## 2020-02-15 DIAGNOSIS — Z950 Presence of cardiac pacemaker: Secondary | ICD-10-CM | POA: Diagnosis not present

## 2020-02-15 DIAGNOSIS — R000 Tachycardia, unspecified: Secondary | ICD-10-CM | POA: Diagnosis not present

## 2020-02-15 DIAGNOSIS — H35039 Hypertensive retinopathy, unspecified eye: Secondary | ICD-10-CM | POA: Diagnosis not present

## 2020-02-15 DIAGNOSIS — M6281 Muscle weakness (generalized): Secondary | ICD-10-CM | POA: Diagnosis not present

## 2020-02-15 DIAGNOSIS — G301 Alzheimer's disease with late onset: Secondary | ICD-10-CM | POA: Diagnosis not present

## 2020-02-15 DIAGNOSIS — I342 Nonrheumatic mitral (valve) stenosis: Secondary | ICD-10-CM | POA: Diagnosis not present

## 2020-02-15 DIAGNOSIS — Z820 Family history of epilepsy and other diseases of the nervous system: Secondary | ICD-10-CM | POA: Diagnosis not present

## 2020-02-15 DIAGNOSIS — H16223 Keratoconjunctivitis sicca, not specified as Sjogren's, bilateral: Secondary | ICD-10-CM | POA: Diagnosis not present

## 2020-02-15 DIAGNOSIS — Z5331 Laparoscopic surgical procedure converted to open procedure: Secondary | ICD-10-CM | POA: Diagnosis not present

## 2020-02-15 DIAGNOSIS — R630 Anorexia: Secondary | ICD-10-CM | POA: Diagnosis not present

## 2020-02-15 DIAGNOSIS — Z7984 Long term (current) use of oral hypoglycemic drugs: Secondary | ICD-10-CM | POA: Diagnosis not present

## 2020-02-15 DIAGNOSIS — H34812 Central retinal vein occlusion, left eye, with macular edema: Secondary | ICD-10-CM | POA: Diagnosis not present

## 2020-02-15 DIAGNOSIS — M859 Disorder of bone density and structure, unspecified: Secondary | ICD-10-CM | POA: Diagnosis not present

## 2020-02-15 DIAGNOSIS — M8668 Other chronic osteomyelitis, other site: Secondary | ICD-10-CM | POA: Diagnosis not present

## 2020-02-15 DIAGNOSIS — S82261F Displaced segmental fracture of shaft of right tibia, subsequent encounter for open fracture type IIIA, IIIB, or IIIC with routine healing: Secondary | ICD-10-CM | POA: Diagnosis not present

## 2020-02-15 DIAGNOSIS — I472 Ventricular tachycardia: Secondary | ICD-10-CM | POA: Diagnosis not present

## 2020-02-15 DIAGNOSIS — R05 Cough: Secondary | ICD-10-CM | POA: Diagnosis not present

## 2020-02-15 DIAGNOSIS — Q181 Preauricular sinus and cyst: Secondary | ICD-10-CM | POA: Diagnosis not present

## 2020-02-15 DIAGNOSIS — I83891 Varicose veins of right lower extremities with other complications: Secondary | ICD-10-CM | POA: Diagnosis not present

## 2020-02-15 DIAGNOSIS — R739 Hyperglycemia, unspecified: Secondary | ICD-10-CM | POA: Diagnosis not present

## 2020-02-15 DIAGNOSIS — K8050 Calculus of bile duct without cholangitis or cholecystitis without obstruction: Secondary | ICD-10-CM | POA: Diagnosis not present

## 2020-02-15 DIAGNOSIS — I340 Nonrheumatic mitral (valve) insufficiency: Secondary | ICD-10-CM | POA: Diagnosis not present

## 2020-02-15 DIAGNOSIS — Z6841 Body Mass Index (BMI) 40.0 and over, adult: Secondary | ICD-10-CM | POA: Diagnosis not present

## 2020-02-15 DIAGNOSIS — C7989 Secondary malignant neoplasm of other specified sites: Secondary | ICD-10-CM | POA: Diagnosis not present

## 2020-02-15 DIAGNOSIS — F419 Anxiety disorder, unspecified: Secondary | ICD-10-CM | POA: Diagnosis not present

## 2020-02-15 DIAGNOSIS — Z1239 Encounter for other screening for malignant neoplasm of breast: Secondary | ICD-10-CM | POA: Diagnosis not present

## 2020-02-15 DIAGNOSIS — I509 Heart failure, unspecified: Secondary | ICD-10-CM | POA: Diagnosis not present

## 2020-02-15 DIAGNOSIS — Z8719 Personal history of other diseases of the digestive system: Secondary | ICD-10-CM | POA: Diagnosis not present

## 2020-02-15 DIAGNOSIS — M170 Bilateral primary osteoarthritis of knee: Secondary | ICD-10-CM | POA: Diagnosis not present

## 2020-02-15 DIAGNOSIS — E119 Type 2 diabetes mellitus without complications: Secondary | ICD-10-CM | POA: Diagnosis not present

## 2020-02-15 DIAGNOSIS — F325 Major depressive disorder, single episode, in full remission: Secondary | ICD-10-CM | POA: Diagnosis not present

## 2020-02-15 DIAGNOSIS — R030 Elevated blood-pressure reading, without diagnosis of hypertension: Secondary | ICD-10-CM | POA: Diagnosis not present

## 2020-02-15 DIAGNOSIS — H52203 Unspecified astigmatism, bilateral: Secondary | ICD-10-CM | POA: Diagnosis not present

## 2020-02-15 DIAGNOSIS — C4491 Basal cell carcinoma of skin, unspecified: Secondary | ICD-10-CM | POA: Diagnosis not present

## 2020-02-15 DIAGNOSIS — G252 Other specified forms of tremor: Secondary | ICD-10-CM | POA: Diagnosis not present

## 2020-02-15 DIAGNOSIS — M180 Bilateral primary osteoarthritis of first carpometacarpal joints: Secondary | ICD-10-CM | POA: Diagnosis not present

## 2020-02-15 DIAGNOSIS — N823 Fistula of vagina to large intestine: Secondary | ICD-10-CM | POA: Diagnosis not present

## 2020-02-15 DIAGNOSIS — E1151 Type 2 diabetes mellitus with diabetic peripheral angiopathy without gangrene: Secondary | ICD-10-CM | POA: Diagnosis not present

## 2020-02-15 DIAGNOSIS — I429 Cardiomyopathy, unspecified: Secondary | ICD-10-CM | POA: Diagnosis not present

## 2020-02-15 DIAGNOSIS — K838 Other specified diseases of biliary tract: Secondary | ICD-10-CM | POA: Diagnosis not present

## 2020-02-15 DIAGNOSIS — Z96651 Presence of right artificial knee joint: Secondary | ICD-10-CM | POA: Diagnosis not present

## 2020-02-15 DIAGNOSIS — R935 Abnormal findings on diagnostic imaging of other abdominal regions, including retroperitoneum: Secondary | ICD-10-CM | POA: Diagnosis not present

## 2020-02-15 DIAGNOSIS — K228 Other specified diseases of esophagus: Secondary | ICD-10-CM | POA: Diagnosis not present

## 2020-02-15 DIAGNOSIS — Q600 Renal agenesis, unilateral: Secondary | ICD-10-CM | POA: Diagnosis not present

## 2020-02-15 DIAGNOSIS — I441 Atrioventricular block, second degree: Secondary | ICD-10-CM | POA: Diagnosis not present

## 2020-02-15 DIAGNOSIS — C541 Malignant neoplasm of endometrium: Secondary | ICD-10-CM | POA: Diagnosis not present

## 2020-02-15 DIAGNOSIS — R918 Other nonspecific abnormal finding of lung field: Secondary | ICD-10-CM | POA: Diagnosis not present

## 2020-02-15 DIAGNOSIS — C159 Malignant neoplasm of esophagus, unspecified: Secondary | ICD-10-CM | POA: Diagnosis not present

## 2020-02-15 DIAGNOSIS — M7052 Other bursitis of knee, left knee: Secondary | ICD-10-CM | POA: Diagnosis not present

## 2020-02-15 DIAGNOSIS — Z23 Encounter for immunization: Secondary | ICD-10-CM | POA: Diagnosis not present

## 2020-02-15 DIAGNOSIS — M4316 Spondylolisthesis, lumbar region: Secondary | ICD-10-CM | POA: Diagnosis not present

## 2020-02-15 DIAGNOSIS — Z8679 Personal history of other diseases of the circulatory system: Secondary | ICD-10-CM | POA: Diagnosis not present

## 2020-02-15 DIAGNOSIS — Z8521 Personal history of malignant neoplasm of larynx: Secondary | ICD-10-CM | POA: Diagnosis not present

## 2020-02-15 DIAGNOSIS — M722 Plantar fascial fibromatosis: Secondary | ICD-10-CM | POA: Diagnosis not present

## 2020-02-15 DIAGNOSIS — N182 Chronic kidney disease, stage 2 (mild): Secondary | ICD-10-CM | POA: Diagnosis not present

## 2020-02-15 DIAGNOSIS — C3412 Malignant neoplasm of upper lobe, left bronchus or lung: Secondary | ICD-10-CM | POA: Diagnosis not present

## 2020-02-15 DIAGNOSIS — R932 Abnormal findings on diagnostic imaging of liver and biliary tract: Secondary | ICD-10-CM | POA: Diagnosis not present

## 2020-02-15 DIAGNOSIS — G894 Chronic pain syndrome: Secondary | ICD-10-CM | POA: Diagnosis not present

## 2020-02-15 DIAGNOSIS — N132 Hydronephrosis with renal and ureteral calculous obstruction: Secondary | ICD-10-CM | POA: Diagnosis not present

## 2020-02-15 DIAGNOSIS — Z880 Allergy status to penicillin: Secondary | ICD-10-CM | POA: Diagnosis not present

## 2020-02-15 DIAGNOSIS — M1612 Unilateral primary osteoarthritis, left hip: Secondary | ICD-10-CM | POA: Diagnosis not present

## 2020-02-15 DIAGNOSIS — Z01812 Encounter for preprocedural laboratory examination: Secondary | ICD-10-CM | POA: Diagnosis not present

## 2020-02-15 DIAGNOSIS — H6123 Impacted cerumen, bilateral: Secondary | ICD-10-CM | POA: Diagnosis not present

## 2020-02-15 DIAGNOSIS — F5105 Insomnia due to other mental disorder: Secondary | ICD-10-CM | POA: Diagnosis not present

## 2020-02-15 DIAGNOSIS — S12500A Unspecified displaced fracture of sixth cervical vertebra, initial encounter for closed fracture: Secondary | ICD-10-CM | POA: Diagnosis not present

## 2020-02-15 DIAGNOSIS — Z5111 Encounter for antineoplastic chemotherapy: Secondary | ICD-10-CM | POA: Diagnosis not present

## 2020-02-15 DIAGNOSIS — E1159 Type 2 diabetes mellitus with other circulatory complications: Secondary | ICD-10-CM | POA: Diagnosis not present

## 2020-02-15 DIAGNOSIS — R599 Enlarged lymph nodes, unspecified: Secondary | ICD-10-CM | POA: Diagnosis not present

## 2020-02-15 DIAGNOSIS — H25811 Combined forms of age-related cataract, right eye: Secondary | ICD-10-CM | POA: Diagnosis not present

## 2020-02-15 DIAGNOSIS — M542 Cervicalgia: Secondary | ICD-10-CM | POA: Diagnosis not present

## 2020-02-15 DIAGNOSIS — L570 Actinic keratosis: Secondary | ICD-10-CM | POA: Diagnosis not present

## 2020-02-15 DIAGNOSIS — M5489 Other dorsalgia: Secondary | ICD-10-CM | POA: Diagnosis not present

## 2020-02-15 DIAGNOSIS — M21611 Bunion of right foot: Secondary | ICD-10-CM | POA: Diagnosis not present

## 2020-02-15 DIAGNOSIS — Z08 Encounter for follow-up examination after completed treatment for malignant neoplasm: Secondary | ICD-10-CM | POA: Diagnosis not present

## 2020-02-15 DIAGNOSIS — M79671 Pain in right foot: Secondary | ICD-10-CM | POA: Diagnosis not present

## 2020-02-15 DIAGNOSIS — I77810 Thoracic aortic ectasia: Secondary | ICD-10-CM | POA: Diagnosis not present

## 2020-02-15 DIAGNOSIS — D84821 Immunodeficiency due to drugs: Secondary | ICD-10-CM | POA: Diagnosis not present

## 2020-02-15 DIAGNOSIS — N1831 Chronic kidney disease, stage 3a: Secondary | ICD-10-CM | POA: Diagnosis not present

## 2020-02-15 DIAGNOSIS — I4819 Other persistent atrial fibrillation: Secondary | ICD-10-CM | POA: Diagnosis not present

## 2020-02-15 DIAGNOSIS — C44722 Squamous cell carcinoma of skin of right lower limb, including hip: Secondary | ICD-10-CM | POA: Diagnosis not present

## 2020-02-15 DIAGNOSIS — Z952 Presence of prosthetic heart valve: Secondary | ICD-10-CM | POA: Diagnosis not present

## 2020-02-15 DIAGNOSIS — I110 Hypertensive heart disease with heart failure: Secondary | ICD-10-CM | POA: Diagnosis not present

## 2020-02-15 DIAGNOSIS — S0191XD Laceration without foreign body of unspecified part of head, subsequent encounter: Secondary | ICD-10-CM | POA: Diagnosis not present

## 2020-02-15 DIAGNOSIS — R350 Frequency of micturition: Secondary | ICD-10-CM | POA: Diagnosis not present

## 2020-02-15 DIAGNOSIS — R600 Localized edema: Secondary | ICD-10-CM | POA: Diagnosis not present

## 2020-02-15 DIAGNOSIS — K828 Other specified diseases of gallbladder: Secondary | ICD-10-CM | POA: Diagnosis not present

## 2020-02-15 DIAGNOSIS — Z91018 Allergy to other foods: Secondary | ICD-10-CM | POA: Diagnosis not present

## 2020-02-15 DIAGNOSIS — D6489 Other specified anemias: Secondary | ICD-10-CM | POA: Diagnosis not present

## 2020-02-15 DIAGNOSIS — Z7689 Persons encountering health services in other specified circumstances: Secondary | ICD-10-CM | POA: Diagnosis not present

## 2020-02-15 DIAGNOSIS — I6381 Other cerebral infarction due to occlusion or stenosis of small artery: Secondary | ICD-10-CM | POA: Diagnosis not present

## 2020-02-15 DIAGNOSIS — Z471 Aftercare following joint replacement surgery: Secondary | ICD-10-CM | POA: Diagnosis not present

## 2020-02-15 DIAGNOSIS — I959 Hypotension, unspecified: Secondary | ICD-10-CM | POA: Diagnosis not present

## 2020-02-15 DIAGNOSIS — C61 Malignant neoplasm of prostate: Secondary | ICD-10-CM | POA: Diagnosis not present

## 2020-02-15 DIAGNOSIS — K219 Gastro-esophageal reflux disease without esophagitis: Secondary | ICD-10-CM | POA: Diagnosis not present

## 2020-02-15 DIAGNOSIS — Z87440 Personal history of urinary (tract) infections: Secondary | ICD-10-CM | POA: Diagnosis not present

## 2020-02-15 DIAGNOSIS — H5462 Unqualified visual loss, left eye, normal vision right eye: Secondary | ICD-10-CM | POA: Diagnosis not present

## 2020-02-15 DIAGNOSIS — K588 Other irritable bowel syndrome: Secondary | ICD-10-CM | POA: Diagnosis not present

## 2020-02-15 DIAGNOSIS — N3000 Acute cystitis without hematuria: Secondary | ICD-10-CM | POA: Diagnosis not present

## 2020-02-15 DIAGNOSIS — I493 Ventricular premature depolarization: Secondary | ICD-10-CM | POA: Diagnosis not present

## 2020-02-15 DIAGNOSIS — K632 Fistula of intestine: Secondary | ICD-10-CM | POA: Diagnosis not present

## 2020-02-15 DIAGNOSIS — H3581 Retinal edema: Secondary | ICD-10-CM | POA: Diagnosis not present

## 2020-02-15 DIAGNOSIS — I69959 Hemiplegia and hemiparesis following unspecified cerebrovascular disease affecting unspecified side: Secondary | ICD-10-CM | POA: Diagnosis not present

## 2020-02-15 DIAGNOSIS — Z131 Encounter for screening for diabetes mellitus: Secondary | ICD-10-CM | POA: Diagnosis not present

## 2020-02-15 DIAGNOSIS — E11621 Type 2 diabetes mellitus with foot ulcer: Secondary | ICD-10-CM | POA: Diagnosis not present

## 2020-02-15 DIAGNOSIS — I129 Hypertensive chronic kidney disease with stage 1 through stage 4 chronic kidney disease, or unspecified chronic kidney disease: Secondary | ICD-10-CM | POA: Diagnosis not present

## 2020-02-15 DIAGNOSIS — E669 Obesity, unspecified: Secondary | ICD-10-CM | POA: Diagnosis not present

## 2020-02-15 DIAGNOSIS — M9903 Segmental and somatic dysfunction of lumbar region: Secondary | ICD-10-CM | POA: Diagnosis not present

## 2020-02-15 DIAGNOSIS — K8070 Calculus of gallbladder and bile duct without cholecystitis without obstruction: Secondary | ICD-10-CM | POA: Diagnosis not present

## 2020-02-15 DIAGNOSIS — M8580 Other specified disorders of bone density and structure, unspecified site: Secondary | ICD-10-CM | POA: Diagnosis not present

## 2020-02-15 DIAGNOSIS — M8949 Other hypertrophic osteoarthropathy, multiple sites: Secondary | ICD-10-CM | POA: Diagnosis not present

## 2020-02-15 DIAGNOSIS — H2512 Age-related nuclear cataract, left eye: Secondary | ICD-10-CM | POA: Diagnosis not present

## 2020-02-15 DIAGNOSIS — M9902 Segmental and somatic dysfunction of thoracic region: Secondary | ICD-10-CM | POA: Diagnosis not present

## 2020-02-15 DIAGNOSIS — M353 Polymyalgia rheumatica: Secondary | ICD-10-CM | POA: Diagnosis not present

## 2020-02-15 DIAGNOSIS — I96 Gangrene, not elsewhere classified: Secondary | ICD-10-CM | POA: Diagnosis not present

## 2020-02-15 DIAGNOSIS — R1312 Dysphagia, oropharyngeal phase: Secondary | ICD-10-CM | POA: Diagnosis not present

## 2020-02-15 DIAGNOSIS — I5042 Chronic combined systolic (congestive) and diastolic (congestive) heart failure: Secondary | ICD-10-CM | POA: Diagnosis not present

## 2020-02-15 DIAGNOSIS — E441 Mild protein-calorie malnutrition: Secondary | ICD-10-CM | POA: Diagnosis not present

## 2020-02-15 DIAGNOSIS — F17210 Nicotine dependence, cigarettes, uncomplicated: Secondary | ICD-10-CM | POA: Diagnosis not present

## 2020-02-15 DIAGNOSIS — K589 Irritable bowel syndrome without diarrhea: Secondary | ICD-10-CM | POA: Diagnosis not present

## 2020-02-15 DIAGNOSIS — C49A0 Gastrointestinal stromal tumor, unspecified site: Secondary | ICD-10-CM | POA: Diagnosis not present

## 2020-02-15 DIAGNOSIS — Z7189 Other specified counseling: Secondary | ICD-10-CM | POA: Diagnosis not present

## 2020-02-15 DIAGNOSIS — Z96641 Presence of right artificial hip joint: Secondary | ICD-10-CM | POA: Diagnosis not present

## 2020-02-15 DIAGNOSIS — I5021 Acute systolic (congestive) heart failure: Secondary | ICD-10-CM | POA: Diagnosis not present

## 2020-02-15 DIAGNOSIS — C20 Malignant neoplasm of rectum: Secondary | ICD-10-CM | POA: Diagnosis not present

## 2020-02-15 DIAGNOSIS — L409 Psoriasis, unspecified: Secondary | ICD-10-CM | POA: Diagnosis not present

## 2020-02-15 DIAGNOSIS — Z48812 Encounter for surgical aftercare following surgery on the circulatory system: Secondary | ICD-10-CM | POA: Diagnosis not present

## 2020-02-15 DIAGNOSIS — M5412 Radiculopathy, cervical region: Secondary | ICD-10-CM | POA: Diagnosis not present

## 2020-02-15 DIAGNOSIS — F4323 Adjustment disorder with mixed anxiety and depressed mood: Secondary | ICD-10-CM | POA: Diagnosis not present

## 2020-02-15 DIAGNOSIS — J9811 Atelectasis: Secondary | ICD-10-CM | POA: Diagnosis not present

## 2020-02-15 DIAGNOSIS — F3170 Bipolar disorder, currently in remission, most recent episode unspecified: Secondary | ICD-10-CM | POA: Diagnosis not present

## 2020-02-15 DIAGNOSIS — M5030 Other cervical disc degeneration, unspecified cervical region: Secondary | ICD-10-CM | POA: Diagnosis not present

## 2020-02-15 DIAGNOSIS — E211 Secondary hyperparathyroidism, not elsewhere classified: Secondary | ICD-10-CM | POA: Diagnosis not present

## 2020-02-15 DIAGNOSIS — Z7951 Long term (current) use of inhaled steroids: Secondary | ICD-10-CM | POA: Diagnosis not present

## 2020-02-15 DIAGNOSIS — M545 Low back pain: Secondary | ICD-10-CM | POA: Diagnosis not present

## 2020-02-15 DIAGNOSIS — D692 Other nonthrombocytopenic purpura: Secondary | ICD-10-CM | POA: Diagnosis not present

## 2020-02-15 DIAGNOSIS — S72002S Fracture of unspecified part of neck of left femur, sequela: Secondary | ICD-10-CM | POA: Diagnosis not present

## 2020-02-15 DIAGNOSIS — I5030 Unspecified diastolic (congestive) heart failure: Secondary | ICD-10-CM | POA: Diagnosis not present

## 2020-02-15 DIAGNOSIS — I071 Rheumatic tricuspid insufficiency: Secondary | ICD-10-CM | POA: Diagnosis not present

## 2020-02-15 DIAGNOSIS — I69828 Other speech and language deficits following other cerebrovascular disease: Secondary | ICD-10-CM | POA: Diagnosis not present

## 2020-02-15 DIAGNOSIS — K5900 Constipation, unspecified: Secondary | ICD-10-CM | POA: Diagnosis not present

## 2020-02-15 DIAGNOSIS — Z8249 Family history of ischemic heart disease and other diseases of the circulatory system: Secondary | ICD-10-CM | POA: Diagnosis not present

## 2020-02-15 DIAGNOSIS — I700 Atherosclerosis of aorta: Secondary | ICD-10-CM | POA: Diagnosis not present

## 2020-02-15 DIAGNOSIS — T466X5A Adverse effect of antihyperlipidemic and antiarteriosclerotic drugs, initial encounter: Secondary | ICD-10-CM | POA: Diagnosis not present

## 2020-02-15 DIAGNOSIS — Z882 Allergy status to sulfonamides status: Secondary | ICD-10-CM | POA: Diagnosis not present

## 2020-02-15 DIAGNOSIS — Z9981 Dependence on supplemental oxygen: Secondary | ICD-10-CM | POA: Diagnosis not present

## 2020-02-15 DIAGNOSIS — E034 Atrophy of thyroid (acquired): Secondary | ICD-10-CM | POA: Diagnosis not present

## 2020-02-15 DIAGNOSIS — G8929 Other chronic pain: Secondary | ICD-10-CM | POA: Diagnosis not present

## 2020-02-15 DIAGNOSIS — M869 Osteomyelitis, unspecified: Secondary | ICD-10-CM | POA: Diagnosis not present

## 2020-02-15 DIAGNOSIS — E1142 Type 2 diabetes mellitus with diabetic polyneuropathy: Secondary | ICD-10-CM | POA: Diagnosis not present

## 2020-02-15 DIAGNOSIS — G4700 Insomnia, unspecified: Secondary | ICD-10-CM | POA: Diagnosis not present

## 2020-02-15 DIAGNOSIS — E7800 Pure hypercholesterolemia, unspecified: Secondary | ICD-10-CM | POA: Diagnosis not present

## 2020-02-15 DIAGNOSIS — Z8042 Family history of malignant neoplasm of prostate: Secondary | ICD-10-CM | POA: Diagnosis not present

## 2020-02-15 DIAGNOSIS — R636 Underweight: Secondary | ICD-10-CM | POA: Diagnosis not present

## 2020-02-15 DIAGNOSIS — D638 Anemia in other chronic diseases classified elsewhere: Secondary | ICD-10-CM | POA: Diagnosis not present

## 2020-02-15 DIAGNOSIS — I447 Left bundle-branch block, unspecified: Secondary | ICD-10-CM | POA: Diagnosis not present

## 2020-02-15 DIAGNOSIS — N6099 Unspecified benign mammary dysplasia of unspecified breast: Secondary | ICD-10-CM | POA: Diagnosis not present

## 2020-02-15 DIAGNOSIS — D472 Monoclonal gammopathy: Secondary | ICD-10-CM | POA: Diagnosis not present

## 2020-02-15 DIAGNOSIS — F5102 Adjustment insomnia: Secondary | ICD-10-CM | POA: Diagnosis not present

## 2020-02-15 DIAGNOSIS — Z03818 Encounter for observation for suspected exposure to other biological agents ruled out: Secondary | ICD-10-CM | POA: Diagnosis not present

## 2020-02-15 DIAGNOSIS — R7401 Elevation of levels of liver transaminase levels: Secondary | ICD-10-CM | POA: Diagnosis not present

## 2020-02-15 DIAGNOSIS — G609 Hereditary and idiopathic neuropathy, unspecified: Secondary | ICD-10-CM | POA: Diagnosis not present

## 2020-02-15 DIAGNOSIS — J9610 Chronic respiratory failure, unspecified whether with hypoxia or hypercapnia: Secondary | ICD-10-CM | POA: Diagnosis not present

## 2020-02-15 DIAGNOSIS — Z905 Acquired absence of kidney: Secondary | ICD-10-CM | POA: Diagnosis not present

## 2020-02-15 DIAGNOSIS — R351 Nocturia: Secondary | ICD-10-CM | POA: Diagnosis not present

## 2020-02-15 DIAGNOSIS — L0202 Furuncle of face: Secondary | ICD-10-CM | POA: Diagnosis not present

## 2020-02-15 DIAGNOSIS — M7542 Impingement syndrome of left shoulder: Secondary | ICD-10-CM | POA: Diagnosis not present

## 2020-02-15 DIAGNOSIS — L738 Other specified follicular disorders: Secondary | ICD-10-CM | POA: Diagnosis not present

## 2020-02-15 DIAGNOSIS — I25119 Atherosclerotic heart disease of native coronary artery with unspecified angina pectoris: Secondary | ICD-10-CM | POA: Diagnosis not present

## 2020-02-15 DIAGNOSIS — I4821 Permanent atrial fibrillation: Secondary | ICD-10-CM | POA: Diagnosis not present

## 2020-02-15 DIAGNOSIS — M1710 Unilateral primary osteoarthritis, unspecified knee: Secondary | ICD-10-CM | POA: Diagnosis not present

## 2020-02-15 DIAGNOSIS — Z951 Presence of aortocoronary bypass graft: Secondary | ICD-10-CM | POA: Diagnosis not present

## 2020-02-15 DIAGNOSIS — J301 Allergic rhinitis due to pollen: Secondary | ICD-10-CM | POA: Diagnosis not present

## 2020-02-15 DIAGNOSIS — N390 Urinary tract infection, site not specified: Secondary | ICD-10-CM | POA: Diagnosis not present

## 2020-02-15 DIAGNOSIS — M19012 Primary osteoarthritis, left shoulder: Secondary | ICD-10-CM | POA: Diagnosis not present

## 2020-02-15 DIAGNOSIS — R040 Epistaxis: Secondary | ICD-10-CM | POA: Diagnosis not present

## 2020-02-15 DIAGNOSIS — M5481 Occipital neuralgia: Secondary | ICD-10-CM | POA: Diagnosis not present

## 2020-02-15 DIAGNOSIS — N319 Neuromuscular dysfunction of bladder, unspecified: Secondary | ICD-10-CM | POA: Diagnosis not present

## 2020-02-15 DIAGNOSIS — M75121 Complete rotator cuff tear or rupture of right shoulder, not specified as traumatic: Secondary | ICD-10-CM | POA: Diagnosis not present

## 2020-02-15 DIAGNOSIS — D509 Iron deficiency anemia, unspecified: Secondary | ICD-10-CM | POA: Diagnosis not present

## 2020-02-15 DIAGNOSIS — Q6211 Congenital occlusion of ureteropelvic junction: Secondary | ICD-10-CM | POA: Diagnosis not present

## 2020-02-15 DIAGNOSIS — I6522 Occlusion and stenosis of left carotid artery: Secondary | ICD-10-CM | POA: Diagnosis not present

## 2020-02-15 DIAGNOSIS — R0789 Other chest pain: Secondary | ICD-10-CM | POA: Diagnosis not present

## 2020-02-15 DIAGNOSIS — Z8612 Personal history of poliomyelitis: Secondary | ICD-10-CM | POA: Diagnosis not present

## 2020-02-15 DIAGNOSIS — S63255D Unspecified dislocation of left ring finger, subsequent encounter: Secondary | ICD-10-CM | POA: Diagnosis not present

## 2020-02-15 DIAGNOSIS — M797 Fibromyalgia: Secondary | ICD-10-CM | POA: Diagnosis not present

## 2020-02-15 DIAGNOSIS — F411 Generalized anxiety disorder: Secondary | ICD-10-CM | POA: Diagnosis not present

## 2020-02-15 DIAGNOSIS — N189 Chronic kidney disease, unspecified: Secondary | ICD-10-CM | POA: Diagnosis not present

## 2020-02-15 DIAGNOSIS — I9589 Other hypotension: Secondary | ICD-10-CM | POA: Diagnosis not present

## 2020-02-15 DIAGNOSIS — R490 Dysphonia: Secondary | ICD-10-CM | POA: Diagnosis not present

## 2020-02-15 DIAGNOSIS — N170 Acute kidney failure with tubular necrosis: Secondary | ICD-10-CM | POA: Diagnosis not present

## 2020-02-15 DIAGNOSIS — Z885 Allergy status to narcotic agent status: Secondary | ICD-10-CM | POA: Diagnosis not present

## 2020-02-15 DIAGNOSIS — Z124 Encounter for screening for malignant neoplasm of cervix: Secondary | ICD-10-CM | POA: Diagnosis not present

## 2020-02-15 DIAGNOSIS — R140 Abdominal distension (gaseous): Secondary | ICD-10-CM | POA: Diagnosis not present

## 2020-02-15 DIAGNOSIS — Z21 Asymptomatic human immunodeficiency virus [HIV] infection status: Secondary | ICD-10-CM | POA: Diagnosis not present

## 2020-02-15 DIAGNOSIS — R55 Syncope and collapse: Secondary | ICD-10-CM | POA: Diagnosis not present

## 2020-02-15 DIAGNOSIS — R400 Somnolence: Secondary | ICD-10-CM | POA: Diagnosis not present

## 2020-02-15 DIAGNOSIS — H02831 Dermatochalasis of right upper eyelid: Secondary | ICD-10-CM | POA: Diagnosis not present

## 2020-02-15 DIAGNOSIS — E039 Hypothyroidism, unspecified: Secondary | ICD-10-CM | POA: Diagnosis not present

## 2020-02-15 DIAGNOSIS — J449 Chronic obstructive pulmonary disease, unspecified: Secondary | ICD-10-CM | POA: Diagnosis not present

## 2020-02-15 DIAGNOSIS — Z1159 Encounter for screening for other viral diseases: Secondary | ICD-10-CM | POA: Diagnosis not present

## 2020-02-15 DIAGNOSIS — Z886 Allergy status to analgesic agent status: Secondary | ICD-10-CM | POA: Diagnosis not present

## 2020-02-15 DIAGNOSIS — F330 Major depressive disorder, recurrent, mild: Secondary | ICD-10-CM | POA: Diagnosis not present

## 2020-02-15 DIAGNOSIS — H35033 Hypertensive retinopathy, bilateral: Secondary | ICD-10-CM | POA: Diagnosis not present

## 2020-02-15 DIAGNOSIS — R6889 Other general symptoms and signs: Secondary | ICD-10-CM | POA: Diagnosis not present

## 2020-02-15 DIAGNOSIS — C8300 Small cell B-cell lymphoma, unspecified site: Secondary | ICD-10-CM | POA: Diagnosis not present

## 2020-02-15 DIAGNOSIS — Z7902 Long term (current) use of antithrombotics/antiplatelets: Secondary | ICD-10-CM | POA: Diagnosis not present

## 2020-02-15 DIAGNOSIS — M47814 Spondylosis without myelopathy or radiculopathy, thoracic region: Secondary | ICD-10-CM | POA: Diagnosis not present

## 2020-02-15 DIAGNOSIS — M5137 Other intervertebral disc degeneration, lumbosacral region: Secondary | ICD-10-CM | POA: Diagnosis not present

## 2020-02-15 DIAGNOSIS — Z800 Family history of malignant neoplasm of digestive organs: Secondary | ICD-10-CM | POA: Diagnosis not present

## 2020-02-15 DIAGNOSIS — C3431 Malignant neoplasm of lower lobe, right bronchus or lung: Secondary | ICD-10-CM | POA: Diagnosis not present

## 2020-02-15 DIAGNOSIS — R931 Abnormal findings on diagnostic imaging of heart and coronary circulation: Secondary | ICD-10-CM | POA: Diagnosis not present

## 2020-02-15 DIAGNOSIS — M5416 Radiculopathy, lumbar region: Secondary | ICD-10-CM | POA: Diagnosis not present

## 2020-02-15 DIAGNOSIS — R627 Adult failure to thrive: Secondary | ICD-10-CM | POA: Diagnosis not present

## 2020-02-15 DIAGNOSIS — F4310 Post-traumatic stress disorder, unspecified: Secondary | ICD-10-CM | POA: Diagnosis not present

## 2020-02-15 DIAGNOSIS — M19011 Primary osteoarthritis, right shoulder: Secondary | ICD-10-CM | POA: Diagnosis not present

## 2020-02-15 DIAGNOSIS — Z853 Personal history of malignant neoplasm of breast: Secondary | ICD-10-CM | POA: Diagnosis not present

## 2020-02-15 DIAGNOSIS — Z538 Procedure and treatment not carried out for other reasons: Secondary | ICD-10-CM | POA: Diagnosis not present

## 2020-02-15 DIAGNOSIS — R195 Other fecal abnormalities: Secondary | ICD-10-CM | POA: Diagnosis not present

## 2020-02-15 DIAGNOSIS — K8020 Calculus of gallbladder without cholecystitis without obstruction: Secondary | ICD-10-CM | POA: Diagnosis not present

## 2020-02-15 DIAGNOSIS — K5730 Diverticulosis of large intestine without perforation or abscess without bleeding: Secondary | ICD-10-CM | POA: Diagnosis not present

## 2020-02-15 DIAGNOSIS — N183 Chronic kidney disease, stage 3 unspecified: Secondary | ICD-10-CM | POA: Diagnosis not present

## 2020-02-15 DIAGNOSIS — Z9181 History of falling: Secondary | ICD-10-CM | POA: Diagnosis not present

## 2020-02-15 DIAGNOSIS — J4530 Mild persistent asthma, uncomplicated: Secondary | ICD-10-CM | POA: Diagnosis not present

## 2020-02-15 DIAGNOSIS — I5043 Acute on chronic combined systolic (congestive) and diastolic (congestive) heart failure: Secondary | ICD-10-CM | POA: Diagnosis not present

## 2020-02-15 DIAGNOSIS — L239 Allergic contact dermatitis, unspecified cause: Secondary | ICD-10-CM | POA: Diagnosis not present

## 2020-02-15 DIAGNOSIS — I739 Peripheral vascular disease, unspecified: Secondary | ICD-10-CM | POA: Diagnosis not present

## 2020-02-15 DIAGNOSIS — M158 Other polyosteoarthritis: Secondary | ICD-10-CM | POA: Diagnosis not present

## 2020-02-15 DIAGNOSIS — E876 Hypokalemia: Secondary | ICD-10-CM | POA: Diagnosis not present

## 2020-02-15 DIAGNOSIS — J42 Unspecified chronic bronchitis: Secondary | ICD-10-CM | POA: Diagnosis not present

## 2020-02-15 DIAGNOSIS — D643 Other sideroblastic anemias: Secondary | ICD-10-CM | POA: Diagnosis not present

## 2020-02-15 DIAGNOSIS — L299 Pruritus, unspecified: Secondary | ICD-10-CM | POA: Diagnosis not present

## 2020-02-15 DIAGNOSIS — G250 Essential tremor: Secondary | ICD-10-CM | POA: Diagnosis not present

## 2020-02-15 DIAGNOSIS — W108XXA Fall (on) (from) other stairs and steps, initial encounter: Secondary | ICD-10-CM | POA: Diagnosis not present

## 2020-02-15 DIAGNOSIS — F0391 Unspecified dementia with behavioral disturbance: Secondary | ICD-10-CM | POA: Diagnosis not present

## 2020-02-15 DIAGNOSIS — H409 Unspecified glaucoma: Secondary | ICD-10-CM | POA: Diagnosis not present

## 2020-02-15 DIAGNOSIS — R7309 Other abnormal glucose: Secondary | ICD-10-CM | POA: Diagnosis not present

## 2020-02-15 DIAGNOSIS — K449 Diaphragmatic hernia without obstruction or gangrene: Secondary | ICD-10-CM | POA: Diagnosis not present

## 2020-02-15 DIAGNOSIS — I2699 Other pulmonary embolism without acute cor pulmonale: Secondary | ICD-10-CM | POA: Diagnosis not present

## 2020-02-15 DIAGNOSIS — Z85828 Personal history of other malignant neoplasm of skin: Secondary | ICD-10-CM | POA: Diagnosis not present

## 2020-02-15 DIAGNOSIS — E569 Vitamin deficiency, unspecified: Secondary | ICD-10-CM | POA: Diagnosis not present

## 2020-02-15 DIAGNOSIS — M5116 Intervertebral disc disorders with radiculopathy, lumbar region: Secondary | ICD-10-CM | POA: Diagnosis not present

## 2020-02-15 DIAGNOSIS — H25813 Combined forms of age-related cataract, bilateral: Secondary | ICD-10-CM | POA: Diagnosis not present

## 2020-02-15 DIAGNOSIS — C3432 Malignant neoplasm of lower lobe, left bronchus or lung: Secondary | ICD-10-CM | POA: Diagnosis not present

## 2020-02-15 DIAGNOSIS — Z9111 Patient's noncompliance with dietary regimen: Secondary | ICD-10-CM | POA: Diagnosis not present

## 2020-02-15 DIAGNOSIS — D72822 Plasmacytosis: Secondary | ICD-10-CM | POA: Diagnosis not present

## 2020-02-15 DIAGNOSIS — R338 Other retention of urine: Secondary | ICD-10-CM | POA: Diagnosis not present

## 2020-02-15 DIAGNOSIS — G40109 Localization-related (focal) (partial) symptomatic epilepsy and epileptic syndromes with simple partial seizures, not intractable, without status epilepticus: Secondary | ICD-10-CM | POA: Diagnosis not present

## 2020-02-15 DIAGNOSIS — E11319 Type 2 diabetes mellitus with unspecified diabetic retinopathy without macular edema: Secondary | ICD-10-CM | POA: Diagnosis not present

## 2020-02-15 DIAGNOSIS — G2581 Restless legs syndrome: Secondary | ICD-10-CM | POA: Diagnosis not present

## 2020-02-15 DIAGNOSIS — L820 Inflamed seborrheic keratosis: Secondary | ICD-10-CM | POA: Diagnosis not present

## 2020-02-15 DIAGNOSIS — Z9884 Bariatric surgery status: Secondary | ICD-10-CM | POA: Diagnosis not present

## 2020-02-15 DIAGNOSIS — R0602 Shortness of breath: Secondary | ICD-10-CM | POA: Diagnosis not present

## 2020-02-15 DIAGNOSIS — E860 Dehydration: Secondary | ICD-10-CM | POA: Diagnosis not present

## 2020-02-15 DIAGNOSIS — E782 Mixed hyperlipidemia: Secondary | ICD-10-CM | POA: Diagnosis not present

## 2020-02-15 DIAGNOSIS — R82998 Other abnormal findings in urine: Secondary | ICD-10-CM | POA: Diagnosis not present

## 2020-02-15 DIAGNOSIS — Z01818 Encounter for other preprocedural examination: Secondary | ICD-10-CM | POA: Diagnosis not present

## 2020-02-15 DIAGNOSIS — I872 Venous insufficiency (chronic) (peripheral): Secondary | ICD-10-CM | POA: Diagnosis not present

## 2020-02-15 DIAGNOSIS — G63 Polyneuropathy in diseases classified elsewhere: Secondary | ICD-10-CM | POA: Diagnosis not present

## 2020-02-15 DIAGNOSIS — K754 Autoimmune hepatitis: Secondary | ICD-10-CM | POA: Diagnosis not present

## 2020-02-15 DIAGNOSIS — I69398 Other sequelae of cerebral infarction: Secondary | ICD-10-CM | POA: Diagnosis not present

## 2020-02-15 DIAGNOSIS — M47892 Other spondylosis, cervical region: Secondary | ICD-10-CM | POA: Diagnosis not present

## 2020-02-15 DIAGNOSIS — K8500 Idiopathic acute pancreatitis without necrosis or infection: Secondary | ICD-10-CM | POA: Diagnosis not present

## 2020-02-15 DIAGNOSIS — K224 Dyskinesia of esophagus: Secondary | ICD-10-CM | POA: Diagnosis not present

## 2020-02-15 DIAGNOSIS — Z436 Encounter for attention to other artificial openings of urinary tract: Secondary | ICD-10-CM | POA: Diagnosis not present

## 2020-02-15 DIAGNOSIS — D696 Thrombocytopenia, unspecified: Secondary | ICD-10-CM | POA: Diagnosis not present

## 2020-02-15 DIAGNOSIS — R6520 Severe sepsis without septic shock: Secondary | ICD-10-CM | POA: Diagnosis not present

## 2020-02-15 DIAGNOSIS — N200 Calculus of kidney: Secondary | ICD-10-CM | POA: Diagnosis not present

## 2020-02-15 DIAGNOSIS — R911 Solitary pulmonary nodule: Secondary | ICD-10-CM | POA: Diagnosis not present

## 2020-02-15 DIAGNOSIS — J8410 Pulmonary fibrosis, unspecified: Secondary | ICD-10-CM | POA: Diagnosis not present

## 2020-02-15 DIAGNOSIS — Z955 Presence of coronary angioplasty implant and graft: Secondary | ICD-10-CM | POA: Diagnosis not present

## 2020-02-15 DIAGNOSIS — M419 Scoliosis, unspecified: Secondary | ICD-10-CM | POA: Diagnosis not present

## 2020-02-15 DIAGNOSIS — I5032 Chronic diastolic (congestive) heart failure: Secondary | ICD-10-CM | POA: Diagnosis not present

## 2020-02-15 DIAGNOSIS — I6523 Occlusion and stenosis of bilateral carotid arteries: Secondary | ICD-10-CM | POA: Diagnosis not present

## 2020-02-15 DIAGNOSIS — F39 Unspecified mood [affective] disorder: Secondary | ICD-10-CM | POA: Diagnosis not present

## 2020-02-15 DIAGNOSIS — Z20822 Contact with and (suspected) exposure to covid-19: Secondary | ICD-10-CM | POA: Diagnosis not present

## 2020-02-15 DIAGNOSIS — Z9484 Stem cells transplant status: Secondary | ICD-10-CM | POA: Diagnosis not present

## 2020-02-15 DIAGNOSIS — R1011 Right upper quadrant pain: Secondary | ICD-10-CM | POA: Diagnosis not present

## 2020-02-15 DIAGNOSIS — Z8739 Personal history of other diseases of the musculoskeletal system and connective tissue: Secondary | ICD-10-CM | POA: Diagnosis not present

## 2020-02-15 DIAGNOSIS — Z87891 Personal history of nicotine dependence: Secondary | ICD-10-CM | POA: Diagnosis not present

## 2020-02-15 DIAGNOSIS — E1140 Type 2 diabetes mellitus with diabetic neuropathy, unspecified: Secondary | ICD-10-CM | POA: Diagnosis not present

## 2020-02-15 DIAGNOSIS — K3189 Other diseases of stomach and duodenum: Secondary | ICD-10-CM | POA: Diagnosis not present

## 2020-02-15 DIAGNOSIS — M109 Gout, unspecified: Secondary | ICD-10-CM | POA: Diagnosis not present

## 2020-02-15 DIAGNOSIS — I2510 Atherosclerotic heart disease of native coronary artery without angina pectoris: Secondary | ICD-10-CM | POA: Diagnosis not present

## 2020-02-15 DIAGNOSIS — N12 Tubulo-interstitial nephritis, not specified as acute or chronic: Secondary | ICD-10-CM | POA: Diagnosis not present

## 2020-02-15 DIAGNOSIS — I951 Orthostatic hypotension: Secondary | ICD-10-CM | POA: Diagnosis not present

## 2020-02-15 DIAGNOSIS — Z992 Dependence on renal dialysis: Secondary | ICD-10-CM | POA: Diagnosis not present

## 2020-02-15 DIAGNOSIS — L905 Scar conditions and fibrosis of skin: Secondary | ICD-10-CM | POA: Diagnosis not present

## 2020-02-15 DIAGNOSIS — Z5181 Encounter for therapeutic drug level monitoring: Secondary | ICD-10-CM | POA: Diagnosis not present

## 2020-02-15 DIAGNOSIS — H25013 Cortical age-related cataract, bilateral: Secondary | ICD-10-CM | POA: Diagnosis not present

## 2020-02-15 DIAGNOSIS — H903 Sensorineural hearing loss, bilateral: Secondary | ICD-10-CM | POA: Diagnosis not present

## 2020-02-15 DIAGNOSIS — S0083XA Contusion of other part of head, initial encounter: Secondary | ICD-10-CM | POA: Diagnosis not present

## 2020-02-15 DIAGNOSIS — R5383 Other fatigue: Secondary | ICD-10-CM | POA: Diagnosis not present

## 2020-02-15 DIAGNOSIS — M25611 Stiffness of right shoulder, not elsewhere classified: Secondary | ICD-10-CM | POA: Diagnosis not present

## 2020-02-15 DIAGNOSIS — H9313 Tinnitus, bilateral: Secondary | ICD-10-CM | POA: Diagnosis not present

## 2020-02-15 DIAGNOSIS — R200 Anesthesia of skin: Secondary | ICD-10-CM | POA: Diagnosis not present

## 2020-02-15 DIAGNOSIS — S32592D Other specified fracture of left pubis, subsequent encounter for fracture with routine healing: Secondary | ICD-10-CM | POA: Diagnosis not present

## 2020-02-15 DIAGNOSIS — L219 Seborrheic dermatitis, unspecified: Secondary | ICD-10-CM | POA: Diagnosis not present

## 2020-02-15 DIAGNOSIS — R161 Splenomegaly, not elsewhere classified: Secondary | ICD-10-CM | POA: Diagnosis not present

## 2020-02-15 DIAGNOSIS — Z86010 Personal history of colonic polyps: Secondary | ICD-10-CM | POA: Diagnosis not present

## 2020-02-15 DIAGNOSIS — Z8261 Family history of arthritis: Secondary | ICD-10-CM | POA: Diagnosis not present

## 2020-02-15 DIAGNOSIS — R109 Unspecified abdominal pain: Secondary | ICD-10-CM | POA: Diagnosis not present

## 2020-02-15 DIAGNOSIS — S42302D Unspecified fracture of shaft of humerus, left arm, subsequent encounter for fracture with routine healing: Secondary | ICD-10-CM | POA: Diagnosis not present

## 2020-02-15 DIAGNOSIS — N289 Disorder of kidney and ureter, unspecified: Secondary | ICD-10-CM | POA: Diagnosis not present

## 2020-02-15 DIAGNOSIS — K2270 Barrett's esophagus without dysplasia: Secondary | ICD-10-CM | POA: Diagnosis not present

## 2020-02-15 DIAGNOSIS — K922 Gastrointestinal hemorrhage, unspecified: Secondary | ICD-10-CM | POA: Diagnosis not present

## 2020-02-15 DIAGNOSIS — R339 Retention of urine, unspecified: Secondary | ICD-10-CM | POA: Diagnosis not present

## 2020-02-15 DIAGNOSIS — S199XXA Unspecified injury of neck, initial encounter: Secondary | ICD-10-CM | POA: Diagnosis not present

## 2020-02-15 DIAGNOSIS — G4733 Obstructive sleep apnea (adult) (pediatric): Secondary | ICD-10-CM | POA: Diagnosis not present

## 2020-02-15 DIAGNOSIS — M47812 Spondylosis without myelopathy or radiculopathy, cervical region: Secondary | ICD-10-CM | POA: Diagnosis not present

## 2020-02-15 DIAGNOSIS — Z01419 Encounter for gynecological examination (general) (routine) without abnormal findings: Secondary | ICD-10-CM | POA: Diagnosis not present

## 2020-02-15 DIAGNOSIS — R7301 Impaired fasting glucose: Secondary | ICD-10-CM | POA: Diagnosis not present

## 2020-02-15 DIAGNOSIS — F3340 Major depressive disorder, recurrent, in remission, unspecified: Secondary | ICD-10-CM | POA: Diagnosis not present

## 2020-02-15 DIAGNOSIS — Z79891 Long term (current) use of opiate analgesic: Secondary | ICD-10-CM | POA: Diagnosis not present

## 2020-02-15 DIAGNOSIS — L97221 Non-pressure chronic ulcer of left calf limited to breakdown of skin: Secondary | ICD-10-CM | POA: Diagnosis not present

## 2020-02-15 DIAGNOSIS — S52301D Unspecified fracture of shaft of right radius, subsequent encounter for closed fracture with routine healing: Secondary | ICD-10-CM | POA: Diagnosis not present

## 2020-02-15 DIAGNOSIS — F324 Major depressive disorder, single episode, in partial remission: Secondary | ICD-10-CM | POA: Diagnosis not present

## 2020-02-15 DIAGNOSIS — M179 Osteoarthritis of knee, unspecified: Secondary | ICD-10-CM | POA: Diagnosis not present

## 2020-02-15 DIAGNOSIS — H1045 Other chronic allergic conjunctivitis: Secondary | ICD-10-CM | POA: Diagnosis not present

## 2020-02-15 DIAGNOSIS — M25612 Stiffness of left shoulder, not elsewhere classified: Secondary | ICD-10-CM | POA: Diagnosis not present

## 2020-02-15 DIAGNOSIS — N179 Acute kidney failure, unspecified: Secondary | ICD-10-CM | POA: Diagnosis not present

## 2020-02-15 DIAGNOSIS — J439 Emphysema, unspecified: Secondary | ICD-10-CM | POA: Diagnosis not present

## 2020-02-15 DIAGNOSIS — R41841 Cognitive communication deficit: Secondary | ICD-10-CM | POA: Diagnosis not present

## 2020-02-15 DIAGNOSIS — K921 Melena: Secondary | ICD-10-CM | POA: Diagnosis not present

## 2020-02-15 DIAGNOSIS — Z87442 Personal history of urinary calculi: Secondary | ICD-10-CM | POA: Diagnosis not present

## 2020-02-15 DIAGNOSIS — Z8673 Personal history of transient ischemic attack (TIA), and cerebral infarction without residual deficits: Secondary | ICD-10-CM | POA: Diagnosis not present

## 2020-02-15 DIAGNOSIS — Z9114 Patient's other noncompliance with medication regimen: Secondary | ICD-10-CM | POA: Diagnosis not present

## 2020-02-15 DIAGNOSIS — C44311 Basal cell carcinoma of skin of nose: Secondary | ICD-10-CM | POA: Diagnosis not present

## 2020-02-15 DIAGNOSIS — R42 Dizziness and giddiness: Secondary | ICD-10-CM | POA: Diagnosis not present

## 2020-02-15 DIAGNOSIS — Z780 Asymptomatic menopausal state: Secondary | ICD-10-CM | POA: Diagnosis not present

## 2020-02-15 DIAGNOSIS — G450 Vertebro-basilar artery syndrome: Secondary | ICD-10-CM | POA: Diagnosis not present

## 2020-02-15 DIAGNOSIS — R58 Hemorrhage, not elsewhere classified: Secondary | ICD-10-CM | POA: Diagnosis not present

## 2020-02-15 DIAGNOSIS — N39498 Other specified urinary incontinence: Secondary | ICD-10-CM | POA: Diagnosis not present

## 2020-02-15 DIAGNOSIS — Z20828 Contact with and (suspected) exposure to other viral communicable diseases: Secondary | ICD-10-CM | POA: Diagnosis not present

## 2020-02-15 DIAGNOSIS — J90 Pleural effusion, not elsewhere classified: Secondary | ICD-10-CM | POA: Diagnosis not present

## 2020-02-15 DIAGNOSIS — R7303 Prediabetes: Secondary | ICD-10-CM | POA: Diagnosis not present

## 2020-02-15 DIAGNOSIS — R1030 Lower abdominal pain, unspecified: Secondary | ICD-10-CM | POA: Diagnosis not present

## 2020-02-15 DIAGNOSIS — Z86711 Personal history of pulmonary embolism: Secondary | ICD-10-CM | POA: Diagnosis not present

## 2020-02-15 DIAGNOSIS — J9611 Chronic respiratory failure with hypoxia: Secondary | ICD-10-CM | POA: Diagnosis not present

## 2020-02-15 DIAGNOSIS — I480 Paroxysmal atrial fibrillation: Secondary | ICD-10-CM | POA: Diagnosis not present

## 2020-02-15 DIAGNOSIS — Z79899 Other long term (current) drug therapy: Secondary | ICD-10-CM | POA: Diagnosis not present

## 2020-02-15 DIAGNOSIS — E663 Overweight: Secondary | ICD-10-CM | POA: Diagnosis not present

## 2020-02-15 DIAGNOSIS — E1169 Type 2 diabetes mellitus with other specified complication: Secondary | ICD-10-CM | POA: Diagnosis not present

## 2020-02-15 DIAGNOSIS — R202 Paresthesia of skin: Secondary | ICD-10-CM | POA: Diagnosis not present

## 2020-02-15 DIAGNOSIS — I712 Thoracic aortic aneurysm, without rupture: Secondary | ICD-10-CM | POA: Diagnosis not present

## 2020-02-15 DIAGNOSIS — M79642 Pain in left hand: Secondary | ICD-10-CM | POA: Diagnosis not present

## 2020-02-15 DIAGNOSIS — E611 Iron deficiency: Secondary | ICD-10-CM | POA: Diagnosis not present

## 2020-02-15 DIAGNOSIS — D510 Vitamin B12 deficiency anemia due to intrinsic factor deficiency: Secondary | ICD-10-CM | POA: Diagnosis not present

## 2020-02-15 DIAGNOSIS — R1013 Epigastric pain: Secondary | ICD-10-CM | POA: Diagnosis not present

## 2020-02-15 DIAGNOSIS — I639 Cerebral infarction, unspecified: Secondary | ICD-10-CM | POA: Diagnosis not present

## 2020-02-15 DIAGNOSIS — K760 Fatty (change of) liver, not elsewhere classified: Secondary | ICD-10-CM | POA: Diagnosis not present

## 2020-02-15 DIAGNOSIS — H43393 Other vitreous opacities, bilateral: Secondary | ICD-10-CM | POA: Diagnosis not present

## 2020-02-15 DIAGNOSIS — M5126 Other intervertebral disc displacement, lumbar region: Secondary | ICD-10-CM | POA: Diagnosis not present

## 2020-02-15 DIAGNOSIS — R399 Unspecified symptoms and signs involving the genitourinary system: Secondary | ICD-10-CM | POA: Diagnosis not present

## 2020-02-15 DIAGNOSIS — Z789 Other specified health status: Secondary | ICD-10-CM | POA: Diagnosis not present

## 2020-02-15 DIAGNOSIS — A419 Sepsis, unspecified organism: Secondary | ICD-10-CM | POA: Diagnosis not present

## 2020-02-15 DIAGNOSIS — Z66 Do not resuscitate: Secondary | ICD-10-CM | POA: Diagnosis not present

## 2020-02-15 DIAGNOSIS — D61818 Other pancytopenia: Secondary | ICD-10-CM | POA: Diagnosis not present

## 2020-02-15 DIAGNOSIS — M1990 Unspecified osteoarthritis, unspecified site: Secondary | ICD-10-CM | POA: Diagnosis not present

## 2020-02-15 DIAGNOSIS — Z1331 Encounter for screening for depression: Secondary | ICD-10-CM | POA: Diagnosis not present

## 2020-02-15 DIAGNOSIS — R0600 Dyspnea, unspecified: Secondary | ICD-10-CM | POA: Diagnosis not present

## 2020-02-15 DIAGNOSIS — I272 Pulmonary hypertension, unspecified: Secondary | ICD-10-CM | POA: Diagnosis not present

## 2020-02-15 DIAGNOSIS — Z6830 Body mass index (BMI) 30.0-30.9, adult: Secondary | ICD-10-CM | POA: Diagnosis not present

## 2020-02-15 DIAGNOSIS — Z9989 Dependence on other enabling machines and devices: Secondary | ICD-10-CM | POA: Diagnosis not present

## 2020-02-15 DIAGNOSIS — H353122 Nonexudative age-related macular degeneration, left eye, intermediate dry stage: Secondary | ICD-10-CM | POA: Diagnosis not present

## 2020-02-15 DIAGNOSIS — M1038 Gout due to renal impairment, vertebrae: Secondary | ICD-10-CM | POA: Diagnosis not present

## 2020-02-15 DIAGNOSIS — M79641 Pain in right hand: Secondary | ICD-10-CM | POA: Diagnosis not present

## 2020-02-15 DIAGNOSIS — M9905 Segmental and somatic dysfunction of pelvic region: Secondary | ICD-10-CM | POA: Diagnosis not present

## 2020-02-15 DIAGNOSIS — U071 COVID-19: Secondary | ICD-10-CM | POA: Diagnosis not present

## 2020-02-15 DIAGNOSIS — D6959 Other secondary thrombocytopenia: Secondary | ICD-10-CM | POA: Diagnosis not present

## 2020-02-15 DIAGNOSIS — R778 Other specified abnormalities of plasma proteins: Secondary | ICD-10-CM | POA: Diagnosis not present

## 2020-02-15 DIAGNOSIS — J343 Hypertrophy of nasal turbinates: Secondary | ICD-10-CM | POA: Diagnosis not present

## 2020-02-15 DIAGNOSIS — N946 Dysmenorrhea, unspecified: Secondary | ICD-10-CM | POA: Diagnosis not present

## 2020-02-15 DIAGNOSIS — F4321 Adjustment disorder with depressed mood: Secondary | ICD-10-CM | POA: Diagnosis not present

## 2020-02-15 DIAGNOSIS — A6000 Herpesviral infection of urogenital system, unspecified: Secondary | ICD-10-CM | POA: Diagnosis not present

## 2020-02-15 DIAGNOSIS — F0390 Unspecified dementia without behavioral disturbance: Secondary | ICD-10-CM | POA: Diagnosis not present

## 2020-02-15 DIAGNOSIS — G43109 Migraine with aura, not intractable, without status migrainosus: Secondary | ICD-10-CM | POA: Diagnosis not present

## 2020-02-15 DIAGNOSIS — Z85118 Personal history of other malignant neoplasm of bronchus and lung: Secondary | ICD-10-CM | POA: Diagnosis not present

## 2020-02-15 DIAGNOSIS — D500 Iron deficiency anemia secondary to blood loss (chronic): Secondary | ICD-10-CM | POA: Diagnosis not present

## 2020-02-15 DIAGNOSIS — H04123 Dry eye syndrome of bilateral lacrimal glands: Secondary | ICD-10-CM | POA: Diagnosis not present

## 2020-02-15 DIAGNOSIS — H2513 Age-related nuclear cataract, bilateral: Secondary | ICD-10-CM | POA: Diagnosis not present

## 2020-02-15 DIAGNOSIS — K625 Hemorrhage of anus and rectum: Secondary | ICD-10-CM | POA: Diagnosis not present

## 2020-02-15 DIAGNOSIS — R2681 Unsteadiness on feet: Secondary | ICD-10-CM | POA: Diagnosis not present

## 2020-02-15 DIAGNOSIS — L821 Other seborrheic keratosis: Secondary | ICD-10-CM | POA: Diagnosis not present

## 2020-02-15 DIAGNOSIS — Z90710 Acquired absence of both cervix and uterus: Secondary | ICD-10-CM | POA: Diagnosis not present

## 2020-02-15 DIAGNOSIS — Z96612 Presence of left artificial shoulder joint: Secondary | ICD-10-CM | POA: Diagnosis not present

## 2020-02-15 DIAGNOSIS — H35373 Puckering of macula, bilateral: Secondary | ICD-10-CM | POA: Diagnosis not present

## 2020-02-15 DIAGNOSIS — Z85060 Personal history of malignant carcinoid tumor of small intestine: Secondary | ICD-10-CM | POA: Diagnosis not present

## 2020-02-15 DIAGNOSIS — X32XXXD Exposure to sunlight, subsequent encounter: Secondary | ICD-10-CM | POA: Diagnosis not present

## 2020-02-15 DIAGNOSIS — Z1389 Encounter for screening for other disorder: Secondary | ICD-10-CM | POA: Diagnosis not present

## 2020-02-15 DIAGNOSIS — Z85038 Personal history of other malignant neoplasm of large intestine: Secondary | ICD-10-CM | POA: Diagnosis not present

## 2020-02-15 DIAGNOSIS — R32 Unspecified urinary incontinence: Secondary | ICD-10-CM | POA: Diagnosis not present

## 2020-02-15 DIAGNOSIS — E538 Deficiency of other specified B group vitamins: Secondary | ICD-10-CM | POA: Diagnosis not present

## 2020-02-15 DIAGNOSIS — Z933 Colostomy status: Secondary | ICD-10-CM | POA: Diagnosis not present

## 2020-02-15 DIAGNOSIS — L719 Rosacea, unspecified: Secondary | ICD-10-CM | POA: Diagnosis not present

## 2020-02-15 DIAGNOSIS — M818 Other osteoporosis without current pathological fracture: Secondary | ICD-10-CM | POA: Diagnosis not present

## 2020-02-15 DIAGNOSIS — R3915 Urgency of urination: Secondary | ICD-10-CM | POA: Diagnosis not present

## 2020-02-15 DIAGNOSIS — I255 Ischemic cardiomyopathy: Secondary | ICD-10-CM | POA: Diagnosis not present

## 2020-02-15 DIAGNOSIS — K5090 Crohn's disease, unspecified, without complications: Secondary | ICD-10-CM | POA: Diagnosis not present

## 2020-02-15 DIAGNOSIS — R7881 Bacteremia: Secondary | ICD-10-CM | POA: Diagnosis not present

## 2020-02-15 DIAGNOSIS — M0579 Rheumatoid arthritis with rheumatoid factor of multiple sites without organ or systems involvement: Secondary | ICD-10-CM | POA: Diagnosis not present

## 2020-02-15 DIAGNOSIS — R21 Rash and other nonspecific skin eruption: Secondary | ICD-10-CM | POA: Diagnosis not present

## 2020-02-15 DIAGNOSIS — Z1211 Encounter for screening for malignant neoplasm of colon: Secondary | ICD-10-CM | POA: Diagnosis not present

## 2020-02-15 DIAGNOSIS — I482 Chronic atrial fibrillation, unspecified: Secondary | ICD-10-CM | POA: Diagnosis not present

## 2020-02-15 DIAGNOSIS — R102 Pelvic and perineal pain: Secondary | ICD-10-CM | POA: Diagnosis not present

## 2020-02-15 DIAGNOSIS — E282 Polycystic ovarian syndrome: Secondary | ICD-10-CM | POA: Diagnosis not present

## 2020-02-15 DIAGNOSIS — Z8701 Personal history of pneumonia (recurrent): Secondary | ICD-10-CM | POA: Diagnosis not present

## 2020-02-15 DIAGNOSIS — N281 Cyst of kidney, acquired: Secondary | ICD-10-CM | POA: Diagnosis not present

## 2020-02-15 DIAGNOSIS — I5022 Chronic systolic (congestive) heart failure: Secondary | ICD-10-CM | POA: Diagnosis not present

## 2020-02-15 DIAGNOSIS — I420 Dilated cardiomyopathy: Secondary | ICD-10-CM | POA: Diagnosis not present

## 2020-02-15 DIAGNOSIS — Z9189 Other specified personal risk factors, not elsewhere classified: Secondary | ICD-10-CM | POA: Diagnosis not present

## 2020-02-15 DIAGNOSIS — F319 Bipolar disorder, unspecified: Secondary | ICD-10-CM | POA: Diagnosis not present

## 2020-02-15 DIAGNOSIS — Z515 Encounter for palliative care: Secondary | ICD-10-CM | POA: Diagnosis not present

## 2020-02-15 DIAGNOSIS — J019 Acute sinusitis, unspecified: Secondary | ICD-10-CM | POA: Diagnosis not present

## 2020-02-15 DIAGNOSIS — R9431 Abnormal electrocardiogram [ECG] [EKG]: Secondary | ICD-10-CM | POA: Diagnosis not present

## 2020-02-15 DIAGNOSIS — Z95828 Presence of other vascular implants and grafts: Secondary | ICD-10-CM | POA: Diagnosis not present

## 2020-02-15 DIAGNOSIS — K5904 Chronic idiopathic constipation: Secondary | ICD-10-CM | POA: Diagnosis not present

## 2020-02-15 DIAGNOSIS — D225 Melanocytic nevi of trunk: Secondary | ICD-10-CM | POA: Diagnosis not present

## 2020-02-15 DIAGNOSIS — M9901 Segmental and somatic dysfunction of cervical region: Secondary | ICD-10-CM | POA: Diagnosis not present

## 2020-02-15 DIAGNOSIS — Z833 Family history of diabetes mellitus: Secondary | ICD-10-CM | POA: Diagnosis not present

## 2020-02-15 DIAGNOSIS — C50912 Malignant neoplasm of unspecified site of left female breast: Secondary | ICD-10-CM | POA: Diagnosis not present

## 2020-02-15 DIAGNOSIS — M79672 Pain in left foot: Secondary | ICD-10-CM | POA: Diagnosis not present

## 2020-02-15 DIAGNOSIS — Z86718 Personal history of other venous thrombosis and embolism: Secondary | ICD-10-CM | POA: Diagnosis not present

## 2020-02-15 DIAGNOSIS — M2042 Other hammer toe(s) (acquired), left foot: Secondary | ICD-10-CM | POA: Diagnosis not present

## 2020-02-15 DIAGNOSIS — I252 Old myocardial infarction: Secondary | ICD-10-CM | POA: Diagnosis not present

## 2020-02-15 DIAGNOSIS — H348322 Tributary (branch) retinal vein occlusion, left eye, stable: Secondary | ICD-10-CM | POA: Diagnosis not present

## 2020-02-15 DIAGNOSIS — Z0001 Encounter for general adult medical examination with abnormal findings: Secondary | ICD-10-CM | POA: Diagnosis not present

## 2020-02-16 DIAGNOSIS — R627 Adult failure to thrive: Secondary | ICD-10-CM | POA: Diagnosis not present

## 2020-02-16 DIAGNOSIS — Z8249 Family history of ischemic heart disease and other diseases of the circulatory system: Secondary | ICD-10-CM | POA: Diagnosis not present

## 2020-02-16 DIAGNOSIS — I493 Ventricular premature depolarization: Secondary | ICD-10-CM | POA: Diagnosis not present

## 2020-02-16 DIAGNOSIS — E7801 Familial hypercholesterolemia: Secondary | ICD-10-CM | POA: Diagnosis not present

## 2020-02-16 DIAGNOSIS — K5730 Diverticulosis of large intestine without perforation or abscess without bleeding: Secondary | ICD-10-CM | POA: Diagnosis not present

## 2020-02-16 DIAGNOSIS — I5030 Unspecified diastolic (congestive) heart failure: Secondary | ICD-10-CM | POA: Diagnosis not present

## 2020-02-16 DIAGNOSIS — R32 Unspecified urinary incontinence: Secondary | ICD-10-CM | POA: Diagnosis not present

## 2020-02-16 DIAGNOSIS — Z8639 Personal history of other endocrine, nutritional and metabolic disease: Secondary | ICD-10-CM | POA: Diagnosis not present

## 2020-02-16 DIAGNOSIS — I69398 Other sequelae of cerebral infarction: Secondary | ICD-10-CM | POA: Diagnosis not present

## 2020-02-16 DIAGNOSIS — G43109 Migraine with aura, not intractable, without status migrainosus: Secondary | ICD-10-CM | POA: Diagnosis not present

## 2020-02-16 DIAGNOSIS — F3181 Bipolar II disorder: Secondary | ICD-10-CM | POA: Diagnosis not present

## 2020-02-16 DIAGNOSIS — E1121 Type 2 diabetes mellitus with diabetic nephropathy: Secondary | ICD-10-CM | POA: Diagnosis not present

## 2020-02-16 DIAGNOSIS — M79604 Pain in right leg: Secondary | ICD-10-CM | POA: Diagnosis not present

## 2020-02-16 DIAGNOSIS — Z6836 Body mass index (BMI) 36.0-36.9, adult: Secondary | ICD-10-CM | POA: Diagnosis not present

## 2020-02-16 DIAGNOSIS — I618 Other nontraumatic intracerebral hemorrhage: Secondary | ICD-10-CM | POA: Diagnosis not present

## 2020-02-16 DIAGNOSIS — M9901 Segmental and somatic dysfunction of cervical region: Secondary | ICD-10-CM | POA: Diagnosis not present

## 2020-02-16 DIAGNOSIS — Z853 Personal history of malignant neoplasm of breast: Secondary | ICD-10-CM | POA: Diagnosis not present

## 2020-02-16 DIAGNOSIS — Z86010 Personal history of colonic polyps: Secondary | ICD-10-CM | POA: Diagnosis not present

## 2020-02-16 DIAGNOSIS — H3582 Retinal ischemia: Secondary | ICD-10-CM | POA: Diagnosis not present

## 2020-02-16 DIAGNOSIS — M79645 Pain in left finger(s): Secondary | ICD-10-CM | POA: Diagnosis not present

## 2020-02-16 DIAGNOSIS — R079 Chest pain, unspecified: Secondary | ICD-10-CM | POA: Diagnosis not present

## 2020-02-16 DIAGNOSIS — I8500 Esophageal varices without bleeding: Secondary | ICD-10-CM | POA: Diagnosis not present

## 2020-02-16 DIAGNOSIS — C50211 Malignant neoplasm of upper-inner quadrant of right female breast: Secondary | ICD-10-CM | POA: Diagnosis not present

## 2020-02-16 DIAGNOSIS — Z79899 Other long term (current) drug therapy: Secondary | ICD-10-CM | POA: Diagnosis not present

## 2020-02-16 DIAGNOSIS — Z885 Allergy status to narcotic agent status: Secondary | ICD-10-CM | POA: Diagnosis not present

## 2020-02-16 DIAGNOSIS — M71342 Other bursal cyst, left hand: Secondary | ICD-10-CM | POA: Diagnosis not present

## 2020-02-16 DIAGNOSIS — R54 Age-related physical debility: Secondary | ICD-10-CM | POA: Diagnosis not present

## 2020-02-16 DIAGNOSIS — R293 Abnormal posture: Secondary | ICD-10-CM | POA: Diagnosis not present

## 2020-02-16 DIAGNOSIS — M25439 Effusion, unspecified wrist: Secondary | ICD-10-CM | POA: Diagnosis not present

## 2020-02-16 DIAGNOSIS — R972 Elevated prostate specific antigen [PSA]: Secondary | ICD-10-CM | POA: Diagnosis not present

## 2020-02-16 DIAGNOSIS — M179 Osteoarthritis of knee, unspecified: Secondary | ICD-10-CM | POA: Diagnosis not present

## 2020-02-16 DIAGNOSIS — I2510 Atherosclerotic heart disease of native coronary artery without angina pectoris: Secondary | ICD-10-CM | POA: Diagnosis not present

## 2020-02-16 DIAGNOSIS — I4891 Unspecified atrial fibrillation: Secondary | ICD-10-CM | POA: Diagnosis not present

## 2020-02-16 DIAGNOSIS — M1710 Unilateral primary osteoarthritis, unspecified knee: Secondary | ICD-10-CM | POA: Diagnosis not present

## 2020-02-16 DIAGNOSIS — Z8542 Personal history of malignant neoplasm of other parts of uterus: Secondary | ICD-10-CM | POA: Diagnosis not present

## 2020-02-16 DIAGNOSIS — D485 Neoplasm of uncertain behavior of skin: Secondary | ICD-10-CM | POA: Diagnosis not present

## 2020-02-16 DIAGNOSIS — C61 Malignant neoplasm of prostate: Secondary | ICD-10-CM | POA: Diagnosis not present

## 2020-02-16 DIAGNOSIS — R809 Proteinuria, unspecified: Secondary | ICD-10-CM | POA: Diagnosis not present

## 2020-02-16 DIAGNOSIS — R110 Nausea: Secondary | ICD-10-CM | POA: Diagnosis not present

## 2020-02-16 DIAGNOSIS — L97529 Non-pressure chronic ulcer of other part of left foot with unspecified severity: Secondary | ICD-10-CM | POA: Diagnosis not present

## 2020-02-16 DIAGNOSIS — G43019 Migraine without aura, intractable, without status migrainosus: Secondary | ICD-10-CM | POA: Diagnosis not present

## 2020-02-16 DIAGNOSIS — Z9181 History of falling: Secondary | ICD-10-CM | POA: Diagnosis not present

## 2020-02-16 DIAGNOSIS — Z9221 Personal history of antineoplastic chemotherapy: Secondary | ICD-10-CM | POA: Diagnosis not present

## 2020-02-16 DIAGNOSIS — T50905D Adverse effect of unspecified drugs, medicaments and biological substances, subsequent encounter: Secondary | ICD-10-CM | POA: Diagnosis not present

## 2020-02-16 DIAGNOSIS — F322 Major depressive disorder, single episode, severe without psychotic features: Secondary | ICD-10-CM | POA: Diagnosis not present

## 2020-02-16 DIAGNOSIS — I2782 Chronic pulmonary embolism: Secondary | ICD-10-CM | POA: Diagnosis not present

## 2020-02-16 DIAGNOSIS — K635 Polyp of colon: Secondary | ICD-10-CM | POA: Diagnosis not present

## 2020-02-16 DIAGNOSIS — F0391 Unspecified dementia with behavioral disturbance: Secondary | ICD-10-CM | POA: Diagnosis not present

## 2020-02-16 DIAGNOSIS — Z1231 Encounter for screening mammogram for malignant neoplasm of breast: Secondary | ICD-10-CM | POA: Diagnosis not present

## 2020-02-16 DIAGNOSIS — W19XXXA Unspecified fall, initial encounter: Secondary | ICD-10-CM | POA: Diagnosis not present

## 2020-02-16 DIAGNOSIS — D472 Monoclonal gammopathy: Secondary | ICD-10-CM | POA: Diagnosis not present

## 2020-02-16 DIAGNOSIS — C50912 Malignant neoplasm of unspecified site of left female breast: Secondary | ICD-10-CM | POA: Diagnosis not present

## 2020-02-16 DIAGNOSIS — K2960 Other gastritis without bleeding: Secondary | ICD-10-CM | POA: Diagnosis not present

## 2020-02-16 DIAGNOSIS — N189 Chronic kidney disease, unspecified: Secondary | ICD-10-CM | POA: Diagnosis not present

## 2020-02-16 DIAGNOSIS — M654 Radial styloid tenosynovitis [de Quervain]: Secondary | ICD-10-CM | POA: Diagnosis not present

## 2020-02-16 DIAGNOSIS — Z85818 Personal history of malignant neoplasm of other sites of lip, oral cavity, and pharynx: Secondary | ICD-10-CM | POA: Diagnosis not present

## 2020-02-16 DIAGNOSIS — J0100 Acute maxillary sinusitis, unspecified: Secondary | ICD-10-CM | POA: Diagnosis not present

## 2020-02-16 DIAGNOSIS — I701 Atherosclerosis of renal artery: Secondary | ICD-10-CM | POA: Diagnosis not present

## 2020-02-16 DIAGNOSIS — G720 Drug-induced myopathy: Secondary | ICD-10-CM | POA: Diagnosis not present

## 2020-02-16 DIAGNOSIS — F429 Obsessive-compulsive disorder, unspecified: Secondary | ICD-10-CM | POA: Diagnosis not present

## 2020-02-16 DIAGNOSIS — R0600 Dyspnea, unspecified: Secondary | ICD-10-CM | POA: Diagnosis not present

## 2020-02-16 DIAGNOSIS — K7460 Unspecified cirrhosis of liver: Secondary | ICD-10-CM | POA: Diagnosis not present

## 2020-02-16 DIAGNOSIS — E538 Deficiency of other specified B group vitamins: Secondary | ICD-10-CM | POA: Diagnosis not present

## 2020-02-16 DIAGNOSIS — I6529 Occlusion and stenosis of unspecified carotid artery: Secondary | ICD-10-CM | POA: Diagnosis not present

## 2020-02-16 DIAGNOSIS — Z888 Allergy status to other drugs, medicaments and biological substances status: Secondary | ICD-10-CM | POA: Diagnosis not present

## 2020-02-16 DIAGNOSIS — H353222 Exudative age-related macular degeneration, left eye, with inactive choroidal neovascularization: Secondary | ICD-10-CM | POA: Diagnosis not present

## 2020-02-16 DIAGNOSIS — J320 Chronic maxillary sinusitis: Secondary | ICD-10-CM | POA: Diagnosis not present

## 2020-02-16 DIAGNOSIS — M5134 Other intervertebral disc degeneration, thoracic region: Secondary | ICD-10-CM | POA: Diagnosis not present

## 2020-02-16 DIAGNOSIS — J3089 Other allergic rhinitis: Secondary | ICD-10-CM | POA: Diagnosis not present

## 2020-02-16 DIAGNOSIS — H903 Sensorineural hearing loss, bilateral: Secondary | ICD-10-CM | POA: Diagnosis not present

## 2020-02-16 DIAGNOSIS — Z923 Personal history of irradiation: Secondary | ICD-10-CM | POA: Diagnosis not present

## 2020-02-16 DIAGNOSIS — R509 Fever, unspecified: Secondary | ICD-10-CM | POA: Diagnosis not present

## 2020-02-16 DIAGNOSIS — R630 Anorexia: Secondary | ICD-10-CM | POA: Diagnosis not present

## 2020-02-16 DIAGNOSIS — I120 Hypertensive chronic kidney disease with stage 5 chronic kidney disease or end stage renal disease: Secondary | ICD-10-CM | POA: Diagnosis not present

## 2020-02-16 DIAGNOSIS — Z992 Dependence on renal dialysis: Secondary | ICD-10-CM | POA: Diagnosis not present

## 2020-02-16 DIAGNOSIS — R1030 Lower abdominal pain, unspecified: Secondary | ICD-10-CM | POA: Diagnosis not present

## 2020-02-16 DIAGNOSIS — M5416 Radiculopathy, lumbar region: Secondary | ICD-10-CM | POA: Diagnosis not present

## 2020-02-16 DIAGNOSIS — I6503 Occlusion and stenosis of bilateral vertebral arteries: Secondary | ICD-10-CM | POA: Diagnosis not present

## 2020-02-16 DIAGNOSIS — R7302 Impaired glucose tolerance (oral): Secondary | ICD-10-CM | POA: Diagnosis not present

## 2020-02-16 DIAGNOSIS — I4821 Permanent atrial fibrillation: Secondary | ICD-10-CM | POA: Diagnosis not present

## 2020-02-16 DIAGNOSIS — C760 Malignant neoplasm of head, face and neck: Secondary | ICD-10-CM | POA: Diagnosis not present

## 2020-02-16 DIAGNOSIS — M7541 Impingement syndrome of right shoulder: Secondary | ICD-10-CM | POA: Diagnosis not present

## 2020-02-16 DIAGNOSIS — F5101 Primary insomnia: Secondary | ICD-10-CM | POA: Diagnosis not present

## 2020-02-16 DIAGNOSIS — N181 Chronic kidney disease, stage 1: Secondary | ICD-10-CM | POA: Diagnosis not present

## 2020-02-16 DIAGNOSIS — F332 Major depressive disorder, recurrent severe without psychotic features: Secondary | ICD-10-CM | POA: Diagnosis not present

## 2020-02-16 DIAGNOSIS — I6521 Occlusion and stenosis of right carotid artery: Secondary | ICD-10-CM | POA: Diagnosis not present

## 2020-02-16 DIAGNOSIS — H353211 Exudative age-related macular degeneration, right eye, with active choroidal neovascularization: Secondary | ICD-10-CM | POA: Diagnosis not present

## 2020-02-16 DIAGNOSIS — N402 Nodular prostate without lower urinary tract symptoms: Secondary | ICD-10-CM | POA: Diagnosis not present

## 2020-02-16 DIAGNOSIS — Z8546 Personal history of malignant neoplasm of prostate: Secondary | ICD-10-CM | POA: Diagnosis not present

## 2020-02-16 DIAGNOSIS — E1122 Type 2 diabetes mellitus with diabetic chronic kidney disease: Secondary | ICD-10-CM | POA: Diagnosis not present

## 2020-02-16 DIAGNOSIS — K219 Gastro-esophageal reflux disease without esophagitis: Secondary | ICD-10-CM | POA: Diagnosis not present

## 2020-02-16 DIAGNOSIS — I272 Pulmonary hypertension, unspecified: Secondary | ICD-10-CM | POA: Diagnosis not present

## 2020-02-16 DIAGNOSIS — Z713 Dietary counseling and surveillance: Secondary | ICD-10-CM | POA: Diagnosis not present

## 2020-02-16 DIAGNOSIS — R252 Cramp and spasm: Secondary | ICD-10-CM | POA: Diagnosis not present

## 2020-02-16 DIAGNOSIS — I426 Alcoholic cardiomyopathy: Secondary | ICD-10-CM | POA: Diagnosis not present

## 2020-02-16 DIAGNOSIS — I442 Atrioventricular block, complete: Secondary | ICD-10-CM | POA: Diagnosis not present

## 2020-02-16 DIAGNOSIS — N888 Other specified noninflammatory disorders of cervix uteri: Secondary | ICD-10-CM | POA: Diagnosis not present

## 2020-02-16 DIAGNOSIS — J40 Bronchitis, not specified as acute or chronic: Secondary | ICD-10-CM | POA: Diagnosis not present

## 2020-02-16 DIAGNOSIS — I25118 Atherosclerotic heart disease of native coronary artery with other forms of angina pectoris: Secondary | ICD-10-CM | POA: Diagnosis not present

## 2020-02-16 DIAGNOSIS — E109 Type 1 diabetes mellitus without complications: Secondary | ICD-10-CM | POA: Diagnosis not present

## 2020-02-16 DIAGNOSIS — T8484XD Pain due to internal orthopedic prosthetic devices, implants and grafts, subsequent encounter: Secondary | ICD-10-CM | POA: Diagnosis not present

## 2020-02-16 DIAGNOSIS — I69351 Hemiplegia and hemiparesis following cerebral infarction affecting right dominant side: Secondary | ICD-10-CM | POA: Diagnosis not present

## 2020-02-16 DIAGNOSIS — Z03818 Encounter for observation for suspected exposure to other biological agents ruled out: Secondary | ICD-10-CM | POA: Diagnosis not present

## 2020-02-16 DIAGNOSIS — N141 Nephropathy induced by other drugs, medicaments and biological substances: Secondary | ICD-10-CM | POA: Diagnosis not present

## 2020-02-16 DIAGNOSIS — M4312 Spondylolisthesis, cervical region: Secondary | ICD-10-CM | POA: Diagnosis not present

## 2020-02-16 DIAGNOSIS — F339 Major depressive disorder, recurrent, unspecified: Secondary | ICD-10-CM | POA: Diagnosis not present

## 2020-02-16 DIAGNOSIS — Z882 Allergy status to sulfonamides status: Secondary | ICD-10-CM | POA: Diagnosis not present

## 2020-02-16 DIAGNOSIS — E063 Autoimmune thyroiditis: Secondary | ICD-10-CM | POA: Diagnosis not present

## 2020-02-16 DIAGNOSIS — E870 Hyperosmolality and hypernatremia: Secondary | ICD-10-CM | POA: Diagnosis not present

## 2020-02-16 DIAGNOSIS — E876 Hypokalemia: Secondary | ICD-10-CM | POA: Diagnosis not present

## 2020-02-16 DIAGNOSIS — I6381 Other cerebral infarction due to occlusion or stenosis of small artery: Secondary | ICD-10-CM | POA: Diagnosis not present

## 2020-02-16 DIAGNOSIS — S82852D Displaced trimalleolar fracture of left lower leg, subsequent encounter for closed fracture with routine healing: Secondary | ICD-10-CM | POA: Diagnosis not present

## 2020-02-16 DIAGNOSIS — E7849 Other hyperlipidemia: Secondary | ICD-10-CM | POA: Diagnosis not present

## 2020-02-16 DIAGNOSIS — L578 Other skin changes due to chronic exposure to nonionizing radiation: Secondary | ICD-10-CM | POA: Diagnosis not present

## 2020-02-16 DIAGNOSIS — E559 Vitamin D deficiency, unspecified: Secondary | ICD-10-CM | POA: Diagnosis not present

## 2020-02-16 DIAGNOSIS — I69393 Ataxia following cerebral infarction: Secondary | ICD-10-CM | POA: Diagnosis not present

## 2020-02-16 DIAGNOSIS — R070 Pain in throat: Secondary | ICD-10-CM | POA: Diagnosis not present

## 2020-02-16 DIAGNOSIS — L570 Actinic keratosis: Secondary | ICD-10-CM | POA: Diagnosis not present

## 2020-02-16 DIAGNOSIS — L988 Other specified disorders of the skin and subcutaneous tissue: Secondary | ICD-10-CM | POA: Diagnosis not present

## 2020-02-16 DIAGNOSIS — E119 Type 2 diabetes mellitus without complications: Secondary | ICD-10-CM | POA: Diagnosis not present

## 2020-02-16 DIAGNOSIS — E785 Hyperlipidemia, unspecified: Secondary | ICD-10-CM | POA: Diagnosis not present

## 2020-02-16 DIAGNOSIS — G43909 Migraine, unspecified, not intractable, without status migrainosus: Secondary | ICD-10-CM | POA: Diagnosis not present

## 2020-02-16 DIAGNOSIS — D2272 Melanocytic nevi of left lower limb, including hip: Secondary | ICD-10-CM | POA: Diagnosis not present

## 2020-02-16 DIAGNOSIS — Z950 Presence of cardiac pacemaker: Secondary | ICD-10-CM | POA: Diagnosis not present

## 2020-02-16 DIAGNOSIS — H3554 Dystrophies primarily involving the retinal pigment epithelium: Secondary | ICD-10-CM | POA: Diagnosis not present

## 2020-02-16 DIAGNOSIS — M25631 Stiffness of right wrist, not elsewhere classified: Secondary | ICD-10-CM | POA: Diagnosis not present

## 2020-02-16 DIAGNOSIS — S20212A Contusion of left front wall of thorax, initial encounter: Secondary | ICD-10-CM | POA: Diagnosis not present

## 2020-02-16 DIAGNOSIS — H02834 Dermatochalasis of left upper eyelid: Secondary | ICD-10-CM | POA: Diagnosis not present

## 2020-02-16 DIAGNOSIS — I6389 Other cerebral infarction: Secondary | ICD-10-CM | POA: Diagnosis not present

## 2020-02-16 DIAGNOSIS — M150 Primary generalized (osteo)arthritis: Secondary | ICD-10-CM | POA: Diagnosis not present

## 2020-02-16 DIAGNOSIS — R1084 Generalized abdominal pain: Secondary | ICD-10-CM | POA: Diagnosis not present

## 2020-02-16 DIAGNOSIS — Z87891 Personal history of nicotine dependence: Secondary | ICD-10-CM | POA: Diagnosis not present

## 2020-02-16 DIAGNOSIS — M25462 Effusion, left knee: Secondary | ICD-10-CM | POA: Diagnosis not present

## 2020-02-16 DIAGNOSIS — Z515 Encounter for palliative care: Secondary | ICD-10-CM | POA: Diagnosis not present

## 2020-02-16 DIAGNOSIS — H6090 Unspecified otitis externa, unspecified ear: Secondary | ICD-10-CM | POA: Diagnosis not present

## 2020-02-16 DIAGNOSIS — G894 Chronic pain syndrome: Secondary | ICD-10-CM | POA: Diagnosis not present

## 2020-02-16 DIAGNOSIS — J329 Chronic sinusitis, unspecified: Secondary | ICD-10-CM | POA: Diagnosis not present

## 2020-02-16 DIAGNOSIS — Z720 Tobacco use: Secondary | ICD-10-CM | POA: Diagnosis not present

## 2020-02-16 DIAGNOSIS — M25652 Stiffness of left hip, not elsewhere classified: Secondary | ICD-10-CM | POA: Diagnosis not present

## 2020-02-16 DIAGNOSIS — R531 Weakness: Secondary | ICD-10-CM | POA: Diagnosis not present

## 2020-02-16 DIAGNOSIS — M5136 Other intervertebral disc degeneration, lumbar region: Secondary | ICD-10-CM | POA: Diagnosis not present

## 2020-02-16 DIAGNOSIS — H5203 Hypermetropia, bilateral: Secondary | ICD-10-CM | POA: Diagnosis not present

## 2020-02-16 DIAGNOSIS — D6869 Other thrombophilia: Secondary | ICD-10-CM | POA: Diagnosis not present

## 2020-02-16 DIAGNOSIS — Z79891 Long term (current) use of opiate analgesic: Secondary | ICD-10-CM | POA: Diagnosis not present

## 2020-02-16 DIAGNOSIS — L989 Disorder of the skin and subcutaneous tissue, unspecified: Secondary | ICD-10-CM | POA: Diagnosis not present

## 2020-02-16 DIAGNOSIS — E0590 Thyrotoxicosis, unspecified without thyrotoxic crisis or storm: Secondary | ICD-10-CM | POA: Diagnosis not present

## 2020-02-16 DIAGNOSIS — Z780 Asymptomatic menopausal state: Secondary | ICD-10-CM | POA: Diagnosis not present

## 2020-02-16 DIAGNOSIS — Z0001 Encounter for general adult medical examination with abnormal findings: Secondary | ICD-10-CM | POA: Diagnosis not present

## 2020-02-16 DIAGNOSIS — H401131 Primary open-angle glaucoma, bilateral, mild stage: Secondary | ICD-10-CM | POA: Diagnosis not present

## 2020-02-16 DIAGNOSIS — F0390 Unspecified dementia without behavioral disturbance: Secondary | ICD-10-CM | POA: Diagnosis not present

## 2020-02-16 DIAGNOSIS — Z86718 Personal history of other venous thrombosis and embolism: Secondary | ICD-10-CM | POA: Diagnosis not present

## 2020-02-16 DIAGNOSIS — R9082 White matter disease, unspecified: Secondary | ICD-10-CM | POA: Diagnosis not present

## 2020-02-16 DIAGNOSIS — D333 Benign neoplasm of cranial nerves: Secondary | ICD-10-CM | POA: Diagnosis not present

## 2020-02-16 DIAGNOSIS — G308 Other Alzheimer's disease: Secondary | ICD-10-CM | POA: Diagnosis not present

## 2020-02-16 DIAGNOSIS — K921 Melena: Secondary | ICD-10-CM | POA: Diagnosis not present

## 2020-02-16 DIAGNOSIS — K648 Other hemorrhoids: Secondary | ICD-10-CM | POA: Diagnosis not present

## 2020-02-16 DIAGNOSIS — N1831 Chronic kidney disease, stage 3a: Secondary | ICD-10-CM | POA: Diagnosis not present

## 2020-02-16 DIAGNOSIS — R5381 Other malaise: Secondary | ICD-10-CM | POA: Diagnosis not present

## 2020-02-16 DIAGNOSIS — Z86011 Personal history of benign neoplasm of the brain: Secondary | ICD-10-CM | POA: Diagnosis not present

## 2020-02-16 DIAGNOSIS — N3943 Post-void dribbling: Secondary | ICD-10-CM | POA: Diagnosis not present

## 2020-02-16 DIAGNOSIS — H02831 Dermatochalasis of right upper eyelid: Secondary | ICD-10-CM | POA: Diagnosis not present

## 2020-02-16 DIAGNOSIS — I63239 Cerebral infarction due to unspecified occlusion or stenosis of unspecified carotid arteries: Secondary | ICD-10-CM | POA: Diagnosis not present

## 2020-02-16 DIAGNOSIS — Z792 Long term (current) use of antibiotics: Secondary | ICD-10-CM | POA: Diagnosis not present

## 2020-02-16 DIAGNOSIS — Z96653 Presence of artificial knee joint, bilateral: Secondary | ICD-10-CM | POA: Diagnosis not present

## 2020-02-16 DIAGNOSIS — C3432 Malignant neoplasm of lower lobe, left bronchus or lung: Secondary | ICD-10-CM | POA: Diagnosis not present

## 2020-02-16 DIAGNOSIS — Z7401 Bed confinement status: Secondary | ICD-10-CM | POA: Diagnosis not present

## 2020-02-16 DIAGNOSIS — M810 Age-related osteoporosis without current pathological fracture: Secondary | ICD-10-CM | POA: Diagnosis not present

## 2020-02-16 DIAGNOSIS — N200 Calculus of kidney: Secondary | ICD-10-CM | POA: Diagnosis not present

## 2020-02-16 DIAGNOSIS — M25461 Effusion, right knee: Secondary | ICD-10-CM | POA: Diagnosis not present

## 2020-02-16 DIAGNOSIS — R350 Frequency of micturition: Secondary | ICD-10-CM | POA: Diagnosis not present

## 2020-02-16 DIAGNOSIS — R05 Cough: Secondary | ICD-10-CM | POA: Diagnosis not present

## 2020-02-16 DIAGNOSIS — Z8673 Personal history of transient ischemic attack (TIA), and cerebral infarction without residual deficits: Secondary | ICD-10-CM | POA: Diagnosis not present

## 2020-02-16 DIAGNOSIS — Z1331 Encounter for screening for depression: Secondary | ICD-10-CM | POA: Diagnosis not present

## 2020-02-16 DIAGNOSIS — H11151 Pinguecula, right eye: Secondary | ICD-10-CM | POA: Diagnosis not present

## 2020-02-16 DIAGNOSIS — L821 Other seborrheic keratosis: Secondary | ICD-10-CM | POA: Diagnosis not present

## 2020-02-16 DIAGNOSIS — M1612 Unilateral primary osteoarthritis, left hip: Secondary | ICD-10-CM | POA: Diagnosis not present

## 2020-02-16 DIAGNOSIS — G4730 Sleep apnea, unspecified: Secondary | ICD-10-CM | POA: Diagnosis not present

## 2020-02-16 DIAGNOSIS — I69322 Dysarthria following cerebral infarction: Secondary | ICD-10-CM | POA: Diagnosis not present

## 2020-02-16 DIAGNOSIS — I499 Cardiac arrhythmia, unspecified: Secondary | ICD-10-CM | POA: Diagnosis not present

## 2020-02-16 DIAGNOSIS — I4892 Unspecified atrial flutter: Secondary | ICD-10-CM | POA: Diagnosis not present

## 2020-02-16 DIAGNOSIS — N401 Enlarged prostate with lower urinary tract symptoms: Secondary | ICD-10-CM | POA: Diagnosis not present

## 2020-02-16 DIAGNOSIS — G629 Polyneuropathy, unspecified: Secondary | ICD-10-CM | POA: Diagnosis not present

## 2020-02-16 DIAGNOSIS — Z6841 Body Mass Index (BMI) 40.0 and over, adult: Secondary | ICD-10-CM | POA: Diagnosis not present

## 2020-02-16 DIAGNOSIS — M069 Rheumatoid arthritis, unspecified: Secondary | ICD-10-CM | POA: Diagnosis not present

## 2020-02-16 DIAGNOSIS — R7309 Other abnormal glucose: Secondary | ICD-10-CM | POA: Diagnosis not present

## 2020-02-16 DIAGNOSIS — Z7901 Long term (current) use of anticoagulants: Secondary | ICD-10-CM | POA: Diagnosis not present

## 2020-02-16 DIAGNOSIS — Z9889 Other specified postprocedural states: Secondary | ICD-10-CM | POA: Diagnosis not present

## 2020-02-16 DIAGNOSIS — H6123 Impacted cerumen, bilateral: Secondary | ICD-10-CM | POA: Diagnosis not present

## 2020-02-16 DIAGNOSIS — F1520 Other stimulant dependence, uncomplicated: Secondary | ICD-10-CM | POA: Diagnosis not present

## 2020-02-16 DIAGNOSIS — Z8669 Personal history of other diseases of the nervous system and sense organs: Secondary | ICD-10-CM | POA: Diagnosis not present

## 2020-02-16 DIAGNOSIS — I714 Abdominal aortic aneurysm, without rupture: Secondary | ICD-10-CM | POA: Diagnosis not present

## 2020-02-16 DIAGNOSIS — M62838 Other muscle spasm: Secondary | ICD-10-CM | POA: Diagnosis not present

## 2020-02-16 DIAGNOSIS — S82142K Displaced bicondylar fracture of left tibia, subsequent encounter for closed fracture with nonunion: Secondary | ICD-10-CM | POA: Diagnosis not present

## 2020-02-16 DIAGNOSIS — M86172 Other acute osteomyelitis, left ankle and foot: Secondary | ICD-10-CM | POA: Diagnosis not present

## 2020-02-16 DIAGNOSIS — E1140 Type 2 diabetes mellitus with diabetic neuropathy, unspecified: Secondary | ICD-10-CM | POA: Diagnosis not present

## 2020-02-16 DIAGNOSIS — M1009 Idiopathic gout, multiple sites: Secondary | ICD-10-CM | POA: Diagnosis not present

## 2020-02-16 DIAGNOSIS — N739 Female pelvic inflammatory disease, unspecified: Secondary | ICD-10-CM | POA: Diagnosis not present

## 2020-02-16 DIAGNOSIS — Z833 Family history of diabetes mellitus: Secondary | ICD-10-CM | POA: Diagnosis not present

## 2020-02-16 DIAGNOSIS — M7918 Myalgia, other site: Secondary | ICD-10-CM | POA: Diagnosis not present

## 2020-02-16 DIAGNOSIS — L820 Inflamed seborrheic keratosis: Secondary | ICD-10-CM | POA: Diagnosis not present

## 2020-02-16 DIAGNOSIS — N368 Other specified disorders of urethra: Secondary | ICD-10-CM | POA: Diagnosis not present

## 2020-02-16 DIAGNOSIS — R3911 Hesitancy of micturition: Secondary | ICD-10-CM | POA: Diagnosis not present

## 2020-02-16 DIAGNOSIS — I6523 Occlusion and stenosis of bilateral carotid arteries: Secondary | ICD-10-CM | POA: Diagnosis not present

## 2020-02-16 DIAGNOSIS — D171 Benign lipomatous neoplasm of skin and subcutaneous tissue of trunk: Secondary | ICD-10-CM | POA: Diagnosis not present

## 2020-02-16 DIAGNOSIS — M62521 Muscle wasting and atrophy, not elsewhere classified, right upper arm: Secondary | ICD-10-CM | POA: Diagnosis not present

## 2020-02-16 DIAGNOSIS — I679 Cerebrovascular disease, unspecified: Secondary | ICD-10-CM | POA: Diagnosis not present

## 2020-02-16 DIAGNOSIS — R0989 Other specified symptoms and signs involving the circulatory and respiratory systems: Secondary | ICD-10-CM | POA: Diagnosis not present

## 2020-02-16 DIAGNOSIS — M3489 Other systemic sclerosis: Secondary | ICD-10-CM | POA: Diagnosis not present

## 2020-02-16 DIAGNOSIS — I739 Peripheral vascular disease, unspecified: Secondary | ICD-10-CM | POA: Diagnosis not present

## 2020-02-16 DIAGNOSIS — C801 Malignant (primary) neoplasm, unspecified: Secondary | ICD-10-CM | POA: Diagnosis not present

## 2020-02-16 DIAGNOSIS — I25708 Atherosclerosis of coronary artery bypass graft(s), unspecified, with other forms of angina pectoris: Secondary | ICD-10-CM | POA: Diagnosis not present

## 2020-02-16 DIAGNOSIS — N6041 Mammary duct ectasia of right breast: Secondary | ICD-10-CM | POA: Diagnosis not present

## 2020-02-16 DIAGNOSIS — M4307 Spondylolysis, lumbosacral region: Secondary | ICD-10-CM | POA: Diagnosis not present

## 2020-02-16 DIAGNOSIS — I872 Venous insufficiency (chronic) (peripheral): Secondary | ICD-10-CM | POA: Diagnosis not present

## 2020-02-16 DIAGNOSIS — M19049 Primary osteoarthritis, unspecified hand: Secondary | ICD-10-CM | POA: Diagnosis not present

## 2020-02-16 DIAGNOSIS — M5000 Cervical disc disorder with myelopathy, unspecified cervical region: Secondary | ICD-10-CM | POA: Diagnosis not present

## 2020-02-16 DIAGNOSIS — M542 Cervicalgia: Secondary | ICD-10-CM | POA: Diagnosis not present

## 2020-02-16 DIAGNOSIS — E11621 Type 2 diabetes mellitus with foot ulcer: Secondary | ICD-10-CM | POA: Diagnosis not present

## 2020-02-16 DIAGNOSIS — N132 Hydronephrosis with renal and ureteral calculous obstruction: Secondary | ICD-10-CM | POA: Diagnosis not present

## 2020-02-16 DIAGNOSIS — H538 Other visual disturbances: Secondary | ICD-10-CM | POA: Diagnosis not present

## 2020-02-16 DIAGNOSIS — D320 Benign neoplasm of cerebral meninges: Secondary | ICD-10-CM | POA: Diagnosis not present

## 2020-02-16 DIAGNOSIS — Z5112 Encounter for antineoplastic immunotherapy: Secondary | ICD-10-CM | POA: Diagnosis not present

## 2020-02-16 DIAGNOSIS — Z9981 Dependence on supplemental oxygen: Secondary | ICD-10-CM | POA: Diagnosis not present

## 2020-02-16 DIAGNOSIS — M0579 Rheumatoid arthritis with rheumatoid factor of multiple sites without organ or systems involvement: Secondary | ICD-10-CM | POA: Diagnosis not present

## 2020-02-16 DIAGNOSIS — H25013 Cortical age-related cataract, bilateral: Secondary | ICD-10-CM | POA: Diagnosis not present

## 2020-02-16 DIAGNOSIS — J410 Simple chronic bronchitis: Secondary | ICD-10-CM | POA: Diagnosis not present

## 2020-02-16 DIAGNOSIS — E560 Deficiency of vitamin E: Secondary | ICD-10-CM | POA: Diagnosis not present

## 2020-02-16 DIAGNOSIS — M1991 Primary osteoarthritis, unspecified site: Secondary | ICD-10-CM | POA: Diagnosis not present

## 2020-02-16 DIAGNOSIS — M85851 Other specified disorders of bone density and structure, right thigh: Secondary | ICD-10-CM | POA: Diagnosis not present

## 2020-02-16 DIAGNOSIS — S22089A Unspecified fracture of T11-T12 vertebra, initial encounter for closed fracture: Secondary | ICD-10-CM | POA: Diagnosis not present

## 2020-02-16 DIAGNOSIS — L219 Seborrheic dermatitis, unspecified: Secondary | ICD-10-CM | POA: Diagnosis not present

## 2020-02-16 DIAGNOSIS — M1811 Unilateral primary osteoarthritis of first carpometacarpal joint, right hand: Secondary | ICD-10-CM | POA: Diagnosis not present

## 2020-02-16 DIAGNOSIS — N281 Cyst of kidney, acquired: Secondary | ICD-10-CM | POA: Diagnosis not present

## 2020-02-16 DIAGNOSIS — M5441 Lumbago with sciatica, right side: Secondary | ICD-10-CM | POA: Diagnosis not present

## 2020-02-16 DIAGNOSIS — I959 Hypotension, unspecified: Secondary | ICD-10-CM | POA: Diagnosis not present

## 2020-02-16 DIAGNOSIS — M79676 Pain in unspecified toe(s): Secondary | ICD-10-CM | POA: Diagnosis not present

## 2020-02-16 DIAGNOSIS — N644 Mastodynia: Secondary | ICD-10-CM | POA: Diagnosis not present

## 2020-02-16 DIAGNOSIS — M545 Low back pain: Secondary | ICD-10-CM | POA: Diagnosis not present

## 2020-02-16 DIAGNOSIS — H268 Other specified cataract: Secondary | ICD-10-CM | POA: Diagnosis not present

## 2020-02-16 DIAGNOSIS — N184 Chronic kidney disease, stage 4 (severe): Secondary | ICD-10-CM | POA: Diagnosis not present

## 2020-02-16 DIAGNOSIS — I6502 Occlusion and stenosis of left vertebral artery: Secondary | ICD-10-CM | POA: Diagnosis not present

## 2020-02-16 DIAGNOSIS — Z7722 Contact with and (suspected) exposure to environmental tobacco smoke (acute) (chronic): Secondary | ICD-10-CM | POA: Diagnosis not present

## 2020-02-16 DIAGNOSIS — F331 Major depressive disorder, recurrent, moderate: Secondary | ICD-10-CM | POA: Diagnosis not present

## 2020-02-16 DIAGNOSIS — T50905S Adverse effect of unspecified drugs, medicaments and biological substances, sequela: Secondary | ICD-10-CM | POA: Diagnosis not present

## 2020-02-16 DIAGNOSIS — I1310 Hypertensive heart and chronic kidney disease without heart failure, with stage 1 through stage 4 chronic kidney disease, or unspecified chronic kidney disease: Secondary | ICD-10-CM | POA: Diagnosis not present

## 2020-02-16 DIAGNOSIS — I350 Nonrheumatic aortic (valve) stenosis: Secondary | ICD-10-CM | POA: Diagnosis not present

## 2020-02-16 DIAGNOSIS — E118 Type 2 diabetes mellitus with unspecified complications: Secondary | ICD-10-CM | POA: Diagnosis not present

## 2020-02-16 DIAGNOSIS — M7631 Iliotibial band syndrome, right leg: Secondary | ICD-10-CM | POA: Diagnosis not present

## 2020-02-16 DIAGNOSIS — S12500A Unspecified displaced fracture of sixth cervical vertebra, initial encounter for closed fracture: Secondary | ICD-10-CM | POA: Diagnosis not present

## 2020-02-16 DIAGNOSIS — F17210 Nicotine dependence, cigarettes, uncomplicated: Secondary | ICD-10-CM | POA: Diagnosis not present

## 2020-02-16 DIAGNOSIS — R7989 Other specified abnormal findings of blood chemistry: Secondary | ICD-10-CM | POA: Diagnosis not present

## 2020-02-16 DIAGNOSIS — R197 Diarrhea, unspecified: Secondary | ICD-10-CM | POA: Diagnosis not present

## 2020-02-16 DIAGNOSIS — K651 Peritoneal abscess: Secondary | ICD-10-CM | POA: Diagnosis not present

## 2020-02-16 DIAGNOSIS — Z13220 Encounter for screening for lipoid disorders: Secondary | ICD-10-CM | POA: Diagnosis not present

## 2020-02-16 DIAGNOSIS — E890 Postprocedural hypothyroidism: Secondary | ICD-10-CM | POA: Diagnosis not present

## 2020-02-16 DIAGNOSIS — L03115 Cellulitis of right lower limb: Secondary | ICD-10-CM | POA: Diagnosis not present

## 2020-02-16 DIAGNOSIS — Z9484 Stem cells transplant status: Secondary | ICD-10-CM | POA: Diagnosis not present

## 2020-02-16 DIAGNOSIS — R0683 Snoring: Secondary | ICD-10-CM | POA: Diagnosis not present

## 2020-02-16 DIAGNOSIS — D0462 Carcinoma in situ of skin of left upper limb, including shoulder: Secondary | ICD-10-CM | POA: Diagnosis not present

## 2020-02-16 DIAGNOSIS — R454 Irritability and anger: Secondary | ICD-10-CM | POA: Diagnosis not present

## 2020-02-16 DIAGNOSIS — S0083XA Contusion of other part of head, initial encounter: Secondary | ICD-10-CM | POA: Diagnosis not present

## 2020-02-16 DIAGNOSIS — Z9884 Bariatric surgery status: Secondary | ICD-10-CM | POA: Diagnosis not present

## 2020-02-16 DIAGNOSIS — D692 Other nonthrombocytopenic purpura: Secondary | ICD-10-CM | POA: Diagnosis not present

## 2020-02-16 DIAGNOSIS — M19041 Primary osteoarthritis, right hand: Secondary | ICD-10-CM | POA: Diagnosis not present

## 2020-02-16 DIAGNOSIS — Z6827 Body mass index (BMI) 27.0-27.9, adult: Secondary | ICD-10-CM | POA: Diagnosis not present

## 2020-02-16 DIAGNOSIS — K222 Esophageal obstruction: Secondary | ICD-10-CM | POA: Diagnosis not present

## 2020-02-16 DIAGNOSIS — S0502XD Injury of conjunctiva and corneal abrasion without foreign body, left eye, subsequent encounter: Secondary | ICD-10-CM | POA: Diagnosis not present

## 2020-02-16 DIAGNOSIS — E7800 Pure hypercholesterolemia, unspecified: Secondary | ICD-10-CM | POA: Diagnosis not present

## 2020-02-16 DIAGNOSIS — I722 Aneurysm of renal artery: Secondary | ICD-10-CM | POA: Diagnosis not present

## 2020-02-16 DIAGNOSIS — R232 Flushing: Secondary | ICD-10-CM | POA: Diagnosis not present

## 2020-02-16 DIAGNOSIS — I444 Left anterior fascicular block: Secondary | ICD-10-CM | POA: Diagnosis not present

## 2020-02-16 DIAGNOSIS — K922 Gastrointestinal hemorrhage, unspecified: Secondary | ICD-10-CM | POA: Diagnosis not present

## 2020-02-16 DIAGNOSIS — M6281 Muscle weakness (generalized): Secondary | ICD-10-CM | POA: Diagnosis not present

## 2020-02-16 DIAGNOSIS — M064 Inflammatory polyarthropathy: Secondary | ICD-10-CM | POA: Diagnosis not present

## 2020-02-16 DIAGNOSIS — G4731 Primary central sleep apnea: Secondary | ICD-10-CM | POA: Diagnosis not present

## 2020-02-16 DIAGNOSIS — L4051 Distal interphalangeal psoriatic arthropathy: Secondary | ICD-10-CM | POA: Diagnosis not present

## 2020-02-16 DIAGNOSIS — R918 Other nonspecific abnormal finding of lung field: Secondary | ICD-10-CM | POA: Diagnosis not present

## 2020-02-16 DIAGNOSIS — I482 Chronic atrial fibrillation, unspecified: Secondary | ICD-10-CM | POA: Diagnosis not present

## 2020-02-16 DIAGNOSIS — M4802 Spinal stenosis, cervical region: Secondary | ICD-10-CM | POA: Diagnosis not present

## 2020-02-16 DIAGNOSIS — R0602 Shortness of breath: Secondary | ICD-10-CM | POA: Diagnosis not present

## 2020-02-16 DIAGNOSIS — G43709 Chronic migraine without aura, not intractable, without status migrainosus: Secondary | ICD-10-CM | POA: Diagnosis not present

## 2020-02-16 DIAGNOSIS — R1013 Epigastric pain: Secondary | ICD-10-CM | POA: Diagnosis not present

## 2020-02-16 DIAGNOSIS — C9210 Chronic myeloid leukemia, BCR/ABL-positive, not having achieved remission: Secondary | ICD-10-CM | POA: Diagnosis not present

## 2020-02-16 DIAGNOSIS — J4530 Mild persistent asthma, uncomplicated: Secondary | ICD-10-CM | POA: Diagnosis not present

## 2020-02-16 DIAGNOSIS — K591 Functional diarrhea: Secondary | ICD-10-CM | POA: Diagnosis not present

## 2020-02-16 DIAGNOSIS — M109 Gout, unspecified: Secondary | ICD-10-CM | POA: Diagnosis not present

## 2020-02-16 DIAGNOSIS — M8448XA Pathological fracture, other site, initial encounter for fracture: Secondary | ICD-10-CM | POA: Diagnosis not present

## 2020-02-16 DIAGNOSIS — D0511 Intraductal carcinoma in situ of right breast: Secondary | ICD-10-CM | POA: Diagnosis not present

## 2020-02-16 DIAGNOSIS — F209 Schizophrenia, unspecified: Secondary | ICD-10-CM | POA: Diagnosis not present

## 2020-02-16 DIAGNOSIS — J9612 Chronic respiratory failure with hypercapnia: Secondary | ICD-10-CM | POA: Diagnosis not present

## 2020-02-16 DIAGNOSIS — N393 Stress incontinence (female) (male): Secondary | ICD-10-CM | POA: Diagnosis not present

## 2020-02-16 DIAGNOSIS — C541 Malignant neoplasm of endometrium: Secondary | ICD-10-CM | POA: Diagnosis not present

## 2020-02-16 DIAGNOSIS — I69328 Other speech and language deficits following cerebral infarction: Secondary | ICD-10-CM | POA: Diagnosis not present

## 2020-02-16 DIAGNOSIS — M25539 Pain in unspecified wrist: Secondary | ICD-10-CM | POA: Diagnosis not present

## 2020-02-16 DIAGNOSIS — M4692 Unspecified inflammatory spondylopathy, cervical region: Secondary | ICD-10-CM | POA: Diagnosis not present

## 2020-02-16 DIAGNOSIS — D124 Benign neoplasm of descending colon: Secondary | ICD-10-CM | POA: Diagnosis not present

## 2020-02-16 DIAGNOSIS — R600 Localized edema: Secondary | ICD-10-CM | POA: Diagnosis not present

## 2020-02-16 DIAGNOSIS — J019 Acute sinusitis, unspecified: Secondary | ICD-10-CM | POA: Diagnosis not present

## 2020-02-16 DIAGNOSIS — M79675 Pain in left toe(s): Secondary | ICD-10-CM | POA: Diagnosis not present

## 2020-02-16 DIAGNOSIS — N5201 Erectile dysfunction due to arterial insufficiency: Secondary | ICD-10-CM | POA: Diagnosis not present

## 2020-02-16 DIAGNOSIS — K5790 Diverticulosis of intestine, part unspecified, without perforation or abscess without bleeding: Secondary | ICD-10-CM | POA: Diagnosis not present

## 2020-02-16 DIAGNOSIS — Z6829 Body mass index (BMI) 29.0-29.9, adult: Secondary | ICD-10-CM | POA: Diagnosis not present

## 2020-02-16 DIAGNOSIS — H539 Unspecified visual disturbance: Secondary | ICD-10-CM | POA: Diagnosis not present

## 2020-02-16 DIAGNOSIS — R3915 Urgency of urination: Secondary | ICD-10-CM | POA: Diagnosis not present

## 2020-02-16 DIAGNOSIS — R911 Solitary pulmonary nodule: Secondary | ICD-10-CM | POA: Diagnosis not present

## 2020-02-16 DIAGNOSIS — W19XXXD Unspecified fall, subsequent encounter: Secondary | ICD-10-CM | POA: Diagnosis not present

## 2020-02-16 DIAGNOSIS — S83241A Other tear of medial meniscus, current injury, right knee, initial encounter: Secondary | ICD-10-CM | POA: Diagnosis not present

## 2020-02-16 DIAGNOSIS — C9000 Multiple myeloma not having achieved remission: Secondary | ICD-10-CM | POA: Diagnosis not present

## 2020-02-16 DIAGNOSIS — J440 Chronic obstructive pulmonary disease with acute lower respiratory infection: Secondary | ICD-10-CM | POA: Diagnosis not present

## 2020-02-16 DIAGNOSIS — Z170 Estrogen receptor positive status [ER+]: Secondary | ICD-10-CM | POA: Diagnosis not present

## 2020-02-16 DIAGNOSIS — Z89612 Acquired absence of left leg above knee: Secondary | ICD-10-CM | POA: Diagnosis not present

## 2020-02-16 DIAGNOSIS — M5412 Radiculopathy, cervical region: Secondary | ICD-10-CM | POA: Diagnosis not present

## 2020-02-16 DIAGNOSIS — G9009 Other idiopathic peripheral autonomic neuropathy: Secondary | ICD-10-CM | POA: Diagnosis not present

## 2020-02-16 DIAGNOSIS — Z91041 Radiographic dye allergy status: Secondary | ICD-10-CM | POA: Diagnosis not present

## 2020-02-16 DIAGNOSIS — R4781 Slurred speech: Secondary | ICD-10-CM | POA: Diagnosis not present

## 2020-02-16 DIAGNOSIS — Z01812 Encounter for preprocedural laboratory examination: Secondary | ICD-10-CM | POA: Diagnosis not present

## 2020-02-16 DIAGNOSIS — R7301 Impaired fasting glucose: Secondary | ICD-10-CM | POA: Diagnosis not present

## 2020-02-16 DIAGNOSIS — K5900 Constipation, unspecified: Secondary | ICD-10-CM | POA: Diagnosis not present

## 2020-02-16 DIAGNOSIS — L97422 Non-pressure chronic ulcer of left heel and midfoot with fat layer exposed: Secondary | ICD-10-CM | POA: Diagnosis not present

## 2020-02-16 DIAGNOSIS — M25519 Pain in unspecified shoulder: Secondary | ICD-10-CM | POA: Diagnosis not present

## 2020-02-16 DIAGNOSIS — H11153 Pinguecula, bilateral: Secondary | ICD-10-CM | POA: Diagnosis not present

## 2020-02-16 DIAGNOSIS — J4521 Mild intermittent asthma with (acute) exacerbation: Secondary | ICD-10-CM | POA: Diagnosis not present

## 2020-02-16 DIAGNOSIS — K5904 Chronic idiopathic constipation: Secondary | ICD-10-CM | POA: Diagnosis not present

## 2020-02-16 DIAGNOSIS — F320 Major depressive disorder, single episode, mild: Secondary | ICD-10-CM | POA: Diagnosis not present

## 2020-02-16 DIAGNOSIS — M24159 Other articular cartilage disorders, unspecified hip: Secondary | ICD-10-CM | POA: Diagnosis not present

## 2020-02-16 DIAGNOSIS — D519 Vitamin B12 deficiency anemia, unspecified: Secondary | ICD-10-CM | POA: Diagnosis not present

## 2020-02-16 DIAGNOSIS — R7303 Prediabetes: Secondary | ICD-10-CM | POA: Diagnosis not present

## 2020-02-16 DIAGNOSIS — E662 Morbid (severe) obesity with alveolar hypoventilation: Secondary | ICD-10-CM | POA: Diagnosis not present

## 2020-02-16 DIAGNOSIS — J45991 Cough variant asthma: Secondary | ICD-10-CM | POA: Diagnosis not present

## 2020-02-16 DIAGNOSIS — E781 Pure hyperglyceridemia: Secondary | ICD-10-CM | POA: Diagnosis not present

## 2020-02-16 DIAGNOSIS — E1159 Type 2 diabetes mellitus with other circulatory complications: Secondary | ICD-10-CM | POA: Diagnosis not present

## 2020-02-16 DIAGNOSIS — Z881 Allergy status to other antibiotic agents status: Secondary | ICD-10-CM | POA: Diagnosis not present

## 2020-02-16 DIAGNOSIS — I69320 Aphasia following cerebral infarction: Secondary | ICD-10-CM | POA: Diagnosis not present

## 2020-02-16 DIAGNOSIS — Z825 Family history of asthma and other chronic lower respiratory diseases: Secondary | ICD-10-CM | POA: Diagnosis not present

## 2020-02-16 DIAGNOSIS — K582 Mixed irritable bowel syndrome: Secondary | ICD-10-CM | POA: Diagnosis not present

## 2020-02-16 DIAGNOSIS — F410 Panic disorder [episodic paroxysmal anxiety] without agoraphobia: Secondary | ICD-10-CM | POA: Diagnosis not present

## 2020-02-16 DIAGNOSIS — E1149 Type 2 diabetes mellitus with other diabetic neurological complication: Secondary | ICD-10-CM | POA: Diagnosis not present

## 2020-02-16 DIAGNOSIS — R062 Wheezing: Secondary | ICD-10-CM | POA: Diagnosis not present

## 2020-02-16 DIAGNOSIS — J431 Panlobular emphysema: Secondary | ICD-10-CM | POA: Diagnosis not present

## 2020-02-16 DIAGNOSIS — Z8616 Personal history of COVID-19: Secondary | ICD-10-CM | POA: Diagnosis not present

## 2020-02-16 DIAGNOSIS — D649 Anemia, unspecified: Secondary | ICD-10-CM | POA: Diagnosis not present

## 2020-02-16 DIAGNOSIS — I469 Cardiac arrest, cause unspecified: Secondary | ICD-10-CM | POA: Diagnosis not present

## 2020-02-16 DIAGNOSIS — Z48813 Encounter for surgical aftercare following surgery on the respiratory system: Secondary | ICD-10-CM | POA: Diagnosis not present

## 2020-02-16 DIAGNOSIS — M1000 Idiopathic gout, unspecified site: Secondary | ICD-10-CM | POA: Diagnosis not present

## 2020-02-16 DIAGNOSIS — M47812 Spondylosis without myelopathy or radiculopathy, cervical region: Secondary | ICD-10-CM | POA: Diagnosis not present

## 2020-02-16 DIAGNOSIS — I2584 Coronary atherosclerosis due to calcified coronary lesion: Secondary | ICD-10-CM | POA: Diagnosis not present

## 2020-02-16 DIAGNOSIS — M47817 Spondylosis without myelopathy or radiculopathy, lumbosacral region: Secondary | ICD-10-CM | POA: Diagnosis not present

## 2020-02-16 DIAGNOSIS — M4722 Other spondylosis with radiculopathy, cervical region: Secondary | ICD-10-CM | POA: Diagnosis not present

## 2020-02-16 DIAGNOSIS — M1812 Unilateral primary osteoarthritis of first carpometacarpal joint, left hand: Secondary | ICD-10-CM | POA: Diagnosis not present

## 2020-02-16 DIAGNOSIS — Z7982 Long term (current) use of aspirin: Secondary | ICD-10-CM | POA: Diagnosis not present

## 2020-02-16 DIAGNOSIS — D631 Anemia in chronic kidney disease: Secondary | ICD-10-CM | POA: Diagnosis not present

## 2020-02-16 DIAGNOSIS — M1711 Unilateral primary osteoarthritis, right knee: Secondary | ICD-10-CM | POA: Diagnosis not present

## 2020-02-16 DIAGNOSIS — L03114 Cellulitis of left upper limb: Secondary | ICD-10-CM | POA: Diagnosis not present

## 2020-02-16 DIAGNOSIS — J8410 Pulmonary fibrosis, unspecified: Secondary | ICD-10-CM | POA: Diagnosis not present

## 2020-02-16 DIAGNOSIS — F319 Bipolar disorder, unspecified: Secondary | ICD-10-CM | POA: Diagnosis not present

## 2020-02-16 DIAGNOSIS — M5431 Sciatica, right side: Secondary | ICD-10-CM | POA: Diagnosis not present

## 2020-02-16 DIAGNOSIS — R799 Abnormal finding of blood chemistry, unspecified: Secondary | ICD-10-CM | POA: Diagnosis not present

## 2020-02-16 DIAGNOSIS — M47816 Spondylosis without myelopathy or radiculopathy, lumbar region: Secondary | ICD-10-CM | POA: Diagnosis not present

## 2020-02-16 DIAGNOSIS — K449 Diaphragmatic hernia without obstruction or gangrene: Secondary | ICD-10-CM | POA: Diagnosis not present

## 2020-02-16 DIAGNOSIS — E039 Hypothyroidism, unspecified: Secondary | ICD-10-CM | POA: Diagnosis not present

## 2020-02-16 DIAGNOSIS — F0280 Dementia in other diseases classified elsewhere without behavioral disturbance: Secondary | ICD-10-CM | POA: Diagnosis not present

## 2020-02-16 DIAGNOSIS — M19012 Primary osteoarthritis, left shoulder: Secondary | ICD-10-CM | POA: Diagnosis not present

## 2020-02-16 DIAGNOSIS — E1129 Type 2 diabetes mellitus with other diabetic kidney complication: Secondary | ICD-10-CM | POA: Diagnosis not present

## 2020-02-16 DIAGNOSIS — Z9009 Acquired absence of other part of head and neck: Secondary | ICD-10-CM | POA: Diagnosis not present

## 2020-02-16 DIAGNOSIS — F4310 Post-traumatic stress disorder, unspecified: Secondary | ICD-10-CM | POA: Diagnosis not present

## 2020-02-16 DIAGNOSIS — R52 Pain, unspecified: Secondary | ICD-10-CM | POA: Diagnosis not present

## 2020-02-16 DIAGNOSIS — M7989 Other specified soft tissue disorders: Secondary | ICD-10-CM | POA: Diagnosis not present

## 2020-02-16 DIAGNOSIS — L89323 Pressure ulcer of left buttock, stage 3: Secondary | ICD-10-CM | POA: Diagnosis not present

## 2020-02-16 DIAGNOSIS — M25561 Pain in right knee: Secondary | ICD-10-CM | POA: Diagnosis not present

## 2020-02-16 DIAGNOSIS — R1310 Dysphagia, unspecified: Secondary | ICD-10-CM | POA: Diagnosis not present

## 2020-02-16 DIAGNOSIS — G620 Drug-induced polyneuropathy: Secondary | ICD-10-CM | POA: Diagnosis not present

## 2020-02-16 DIAGNOSIS — D62 Acute posthemorrhagic anemia: Secondary | ICD-10-CM | POA: Diagnosis not present

## 2020-02-16 DIAGNOSIS — M9905 Segmental and somatic dysfunction of pelvic region: Secondary | ICD-10-CM | POA: Diagnosis not present

## 2020-02-16 DIAGNOSIS — R030 Elevated blood-pressure reading, without diagnosis of hypertension: Secondary | ICD-10-CM | POA: Diagnosis not present

## 2020-02-16 DIAGNOSIS — F3170 Bipolar disorder, currently in remission, most recent episode unspecified: Secondary | ICD-10-CM | POA: Diagnosis not present

## 2020-02-16 DIAGNOSIS — Z1211 Encounter for screening for malignant neoplasm of colon: Secondary | ICD-10-CM | POA: Diagnosis not present

## 2020-02-16 DIAGNOSIS — R922 Inconclusive mammogram: Secondary | ICD-10-CM | POA: Diagnosis not present

## 2020-02-16 DIAGNOSIS — M5442 Lumbago with sciatica, left side: Secondary | ICD-10-CM | POA: Diagnosis not present

## 2020-02-16 DIAGNOSIS — G309 Alzheimer's disease, unspecified: Secondary | ICD-10-CM | POA: Diagnosis not present

## 2020-02-16 DIAGNOSIS — R2689 Other abnormalities of gait and mobility: Secondary | ICD-10-CM | POA: Diagnosis not present

## 2020-02-16 DIAGNOSIS — M1990 Unspecified osteoarthritis, unspecified site: Secondary | ICD-10-CM | POA: Diagnosis not present

## 2020-02-16 DIAGNOSIS — C7989 Secondary malignant neoplasm of other specified sites: Secondary | ICD-10-CM | POA: Diagnosis not present

## 2020-02-16 DIAGNOSIS — I5042 Chronic combined systolic (congestive) and diastolic (congestive) heart failure: Secondary | ICD-10-CM | POA: Diagnosis not present

## 2020-02-16 DIAGNOSIS — C229 Malignant neoplasm of liver, not specified as primary or secondary: Secondary | ICD-10-CM | POA: Diagnosis not present

## 2020-02-16 DIAGNOSIS — M9902 Segmental and somatic dysfunction of thoracic region: Secondary | ICD-10-CM | POA: Diagnosis not present

## 2020-02-16 DIAGNOSIS — Z90710 Acquired absence of both cervix and uterus: Secondary | ICD-10-CM | POA: Diagnosis not present

## 2020-02-16 DIAGNOSIS — I129 Hypertensive chronic kidney disease with stage 1 through stage 4 chronic kidney disease, or unspecified chronic kidney disease: Secondary | ICD-10-CM | POA: Diagnosis not present

## 2020-02-16 DIAGNOSIS — J9811 Atelectasis: Secondary | ICD-10-CM | POA: Diagnosis not present

## 2020-02-16 DIAGNOSIS — H35031 Hypertensive retinopathy, right eye: Secondary | ICD-10-CM | POA: Diagnosis not present

## 2020-02-16 DIAGNOSIS — Z7409 Other reduced mobility: Secondary | ICD-10-CM | POA: Diagnosis not present

## 2020-02-16 DIAGNOSIS — E1069 Type 1 diabetes mellitus with other specified complication: Secondary | ICD-10-CM | POA: Diagnosis not present

## 2020-02-16 DIAGNOSIS — M792 Neuralgia and neuritis, unspecified: Secondary | ICD-10-CM | POA: Diagnosis not present

## 2020-02-16 DIAGNOSIS — M79605 Pain in left leg: Secondary | ICD-10-CM | POA: Diagnosis not present

## 2020-02-16 DIAGNOSIS — I340 Nonrheumatic mitral (valve) insufficiency: Secondary | ICD-10-CM | POA: Diagnosis not present

## 2020-02-16 DIAGNOSIS — M9904 Segmental and somatic dysfunction of sacral region: Secondary | ICD-10-CM | POA: Diagnosis not present

## 2020-02-16 DIAGNOSIS — C538 Malignant neoplasm of overlapping sites of cervix uteri: Secondary | ICD-10-CM | POA: Diagnosis not present

## 2020-02-16 DIAGNOSIS — J9611 Chronic respiratory failure with hypoxia: Secondary | ICD-10-CM | POA: Diagnosis not present

## 2020-02-16 DIAGNOSIS — Z85828 Personal history of other malignant neoplasm of skin: Secondary | ICD-10-CM | POA: Diagnosis not present

## 2020-02-16 DIAGNOSIS — M2550 Pain in unspecified joint: Secondary | ICD-10-CM | POA: Diagnosis not present

## 2020-02-16 DIAGNOSIS — J3081 Allergic rhinitis due to animal (cat) (dog) hair and dander: Secondary | ICD-10-CM | POA: Diagnosis not present

## 2020-02-16 DIAGNOSIS — H52202 Unspecified astigmatism, left eye: Secondary | ICD-10-CM | POA: Diagnosis not present

## 2020-02-16 DIAGNOSIS — E210 Primary hyperparathyroidism: Secondary | ICD-10-CM | POA: Diagnosis not present

## 2020-02-16 DIAGNOSIS — L309 Dermatitis, unspecified: Secondary | ICD-10-CM | POA: Diagnosis not present

## 2020-02-16 DIAGNOSIS — I083 Combined rheumatic disorders of mitral, aortic and tricuspid valves: Secondary | ICD-10-CM | POA: Diagnosis not present

## 2020-02-16 DIAGNOSIS — R2681 Unsteadiness on feet: Secondary | ICD-10-CM | POA: Diagnosis not present

## 2020-02-16 DIAGNOSIS — Z96642 Presence of left artificial hip joint: Secondary | ICD-10-CM | POA: Diagnosis not present

## 2020-02-16 DIAGNOSIS — Z933 Colostomy status: Secondary | ICD-10-CM | POA: Diagnosis not present

## 2020-02-16 DIAGNOSIS — F325 Major depressive disorder, single episode, in full remission: Secondary | ICD-10-CM | POA: Diagnosis not present

## 2020-02-16 DIAGNOSIS — Z48812 Encounter for surgical aftercare following surgery on the circulatory system: Secondary | ICD-10-CM | POA: Diagnosis not present

## 2020-02-16 DIAGNOSIS — F0281 Dementia in other diseases classified elsewhere with behavioral disturbance: Secondary | ICD-10-CM | POA: Diagnosis not present

## 2020-02-16 DIAGNOSIS — M79641 Pain in right hand: Secondary | ICD-10-CM | POA: Diagnosis not present

## 2020-02-16 DIAGNOSIS — G7111 Myotonic muscular dystrophy: Secondary | ICD-10-CM | POA: Diagnosis not present

## 2020-02-16 DIAGNOSIS — G64 Other disorders of peripheral nervous system: Secondary | ICD-10-CM | POA: Diagnosis not present

## 2020-02-16 DIAGNOSIS — R300 Dysuria: Secondary | ICD-10-CM | POA: Diagnosis not present

## 2020-02-16 DIAGNOSIS — F329 Major depressive disorder, single episode, unspecified: Secondary | ICD-10-CM | POA: Diagnosis not present

## 2020-02-16 DIAGNOSIS — K529 Noninfective gastroenteritis and colitis, unspecified: Secondary | ICD-10-CM | POA: Diagnosis not present

## 2020-02-16 DIAGNOSIS — Z794 Long term (current) use of insulin: Secondary | ICD-10-CM | POA: Diagnosis not present

## 2020-02-16 DIAGNOSIS — R140 Abdominal distension (gaseous): Secondary | ICD-10-CM | POA: Diagnosis not present

## 2020-02-16 DIAGNOSIS — D0512 Intraductal carcinoma in situ of left breast: Secondary | ICD-10-CM | POA: Diagnosis not present

## 2020-02-16 DIAGNOSIS — I080 Rheumatic disorders of both mitral and aortic valves: Secondary | ICD-10-CM | POA: Diagnosis not present

## 2020-02-16 DIAGNOSIS — R61 Generalized hyperhidrosis: Secondary | ICD-10-CM | POA: Diagnosis not present

## 2020-02-16 DIAGNOSIS — Z4509 Encounter for adjustment and management of other cardiac device: Secondary | ICD-10-CM | POA: Diagnosis not present

## 2020-02-16 DIAGNOSIS — F324 Major depressive disorder, single episode, in partial remission: Secondary | ICD-10-CM | POA: Diagnosis not present

## 2020-02-16 DIAGNOSIS — I951 Orthostatic hypotension: Secondary | ICD-10-CM | POA: Diagnosis not present

## 2020-02-16 DIAGNOSIS — E43 Unspecified severe protein-calorie malnutrition: Secondary | ICD-10-CM | POA: Diagnosis not present

## 2020-02-16 DIAGNOSIS — K2100 Gastro-esophageal reflux disease with esophagitis, without bleeding: Secondary | ICD-10-CM | POA: Diagnosis not present

## 2020-02-16 DIAGNOSIS — M1A09X0 Idiopathic chronic gout, multiple sites, without tophus (tophi): Secondary | ICD-10-CM | POA: Diagnosis not present

## 2020-02-16 DIAGNOSIS — I5020 Unspecified systolic (congestive) heart failure: Secondary | ICD-10-CM | POA: Diagnosis not present

## 2020-02-16 DIAGNOSIS — Z7902 Long term (current) use of antithrombotics/antiplatelets: Secondary | ICD-10-CM | POA: Diagnosis not present

## 2020-02-16 DIAGNOSIS — K3189 Other diseases of stomach and duodenum: Secondary | ICD-10-CM | POA: Diagnosis not present

## 2020-02-16 DIAGNOSIS — F4323 Adjustment disorder with mixed anxiety and depressed mood: Secondary | ICD-10-CM | POA: Diagnosis not present

## 2020-02-16 DIAGNOSIS — I6930 Unspecified sequelae of cerebral infarction: Secondary | ICD-10-CM | POA: Diagnosis not present

## 2020-02-16 DIAGNOSIS — E291 Testicular hypofunction: Secondary | ICD-10-CM | POA: Diagnosis not present

## 2020-02-16 DIAGNOSIS — N8111 Cystocele, midline: Secondary | ICD-10-CM | POA: Diagnosis not present

## 2020-02-16 DIAGNOSIS — I252 Old myocardial infarction: Secondary | ICD-10-CM | POA: Diagnosis not present

## 2020-02-16 DIAGNOSIS — E1142 Type 2 diabetes mellitus with diabetic polyneuropathy: Secondary | ICD-10-CM | POA: Diagnosis not present

## 2020-02-16 DIAGNOSIS — F418 Other specified anxiety disorders: Secondary | ICD-10-CM | POA: Diagnosis not present

## 2020-02-16 DIAGNOSIS — D751 Secondary polycythemia: Secondary | ICD-10-CM | POA: Diagnosis not present

## 2020-02-16 DIAGNOSIS — C19 Malignant neoplasm of rectosigmoid junction: Secondary | ICD-10-CM | POA: Diagnosis not present

## 2020-02-16 DIAGNOSIS — Z122 Encounter for screening for malignant neoplasm of respiratory organs: Secondary | ICD-10-CM | POA: Diagnosis not present

## 2020-02-16 DIAGNOSIS — E042 Nontoxic multinodular goiter: Secondary | ICD-10-CM | POA: Diagnosis not present

## 2020-02-16 DIAGNOSIS — M79643 Pain in unspecified hand: Secondary | ICD-10-CM | POA: Diagnosis not present

## 2020-02-16 DIAGNOSIS — H57813 Brow ptosis, bilateral: Secondary | ICD-10-CM | POA: Diagnosis not present

## 2020-02-16 DIAGNOSIS — M2170 Unequal limb length (acquired), unspecified site: Secondary | ICD-10-CM | POA: Diagnosis not present

## 2020-02-16 DIAGNOSIS — J849 Interstitial pulmonary disease, unspecified: Secondary | ICD-10-CM | POA: Diagnosis not present

## 2020-02-16 DIAGNOSIS — D508 Other iron deficiency anemias: Secondary | ICD-10-CM | POA: Diagnosis not present

## 2020-02-16 DIAGNOSIS — M5417 Radiculopathy, lumbosacral region: Secondary | ICD-10-CM | POA: Diagnosis not present

## 2020-02-16 DIAGNOSIS — I5022 Chronic systolic (congestive) heart failure: Secondary | ICD-10-CM | POA: Diagnosis not present

## 2020-02-16 DIAGNOSIS — G5601 Carpal tunnel syndrome, right upper limb: Secondary | ICD-10-CM | POA: Diagnosis not present

## 2020-02-16 DIAGNOSIS — N61 Mastitis without abscess: Secondary | ICD-10-CM | POA: Diagnosis not present

## 2020-02-16 DIAGNOSIS — F411 Generalized anxiety disorder: Secondary | ICD-10-CM | POA: Diagnosis not present

## 2020-02-16 DIAGNOSIS — G3184 Mild cognitive impairment, so stated: Secondary | ICD-10-CM | POA: Diagnosis not present

## 2020-02-16 DIAGNOSIS — C7951 Secondary malignant neoplasm of bone: Secondary | ICD-10-CM | POA: Diagnosis not present

## 2020-02-16 DIAGNOSIS — Z20822 Contact with and (suspected) exposure to covid-19: Secondary | ICD-10-CM | POA: Diagnosis not present

## 2020-02-16 DIAGNOSIS — N451 Epididymitis: Secondary | ICD-10-CM | POA: Diagnosis not present

## 2020-02-16 DIAGNOSIS — S299XXA Unspecified injury of thorax, initial encounter: Secondary | ICD-10-CM | POA: Diagnosis not present

## 2020-02-16 DIAGNOSIS — M4720 Other spondylosis with radiculopathy, site unspecified: Secondary | ICD-10-CM | POA: Diagnosis not present

## 2020-02-16 DIAGNOSIS — Z7984 Long term (current) use of oral hypoglycemic drugs: Secondary | ICD-10-CM | POA: Diagnosis not present

## 2020-02-16 DIAGNOSIS — F321 Major depressive disorder, single episode, moderate: Secondary | ICD-10-CM | POA: Diagnosis not present

## 2020-02-16 DIAGNOSIS — Z01818 Encounter for other preprocedural examination: Secondary | ICD-10-CM | POA: Diagnosis not present

## 2020-02-16 DIAGNOSIS — N819 Female genital prolapse, unspecified: Secondary | ICD-10-CM | POA: Diagnosis not present

## 2020-02-16 DIAGNOSIS — B029 Zoster without complications: Secondary | ICD-10-CM | POA: Diagnosis not present

## 2020-02-16 DIAGNOSIS — F29 Unspecified psychosis not due to a substance or known physiological condition: Secondary | ICD-10-CM | POA: Diagnosis not present

## 2020-02-16 DIAGNOSIS — N183 Chronic kidney disease, stage 3 unspecified: Secondary | ICD-10-CM | POA: Diagnosis not present

## 2020-02-16 DIAGNOSIS — K254 Chronic or unspecified gastric ulcer with hemorrhage: Secondary | ICD-10-CM | POA: Diagnosis not present

## 2020-02-16 DIAGNOSIS — Z96651 Presence of right artificial knee joint: Secondary | ICD-10-CM | POA: Diagnosis not present

## 2020-02-16 DIAGNOSIS — C44622 Squamous cell carcinoma of skin of right upper limb, including shoulder: Secondary | ICD-10-CM | POA: Diagnosis not present

## 2020-02-16 DIAGNOSIS — H348322 Tributary (branch) retinal vein occlusion, left eye, stable: Secondary | ICD-10-CM | POA: Diagnosis not present

## 2020-02-16 DIAGNOSIS — M159 Polyosteoarthritis, unspecified: Secondary | ICD-10-CM | POA: Diagnosis not present

## 2020-02-16 DIAGNOSIS — L89313 Pressure ulcer of right buttock, stage 3: Secondary | ICD-10-CM | POA: Diagnosis not present

## 2020-02-16 DIAGNOSIS — K2950 Unspecified chronic gastritis without bleeding: Secondary | ICD-10-CM | POA: Diagnosis not present

## 2020-02-16 DIAGNOSIS — L97822 Non-pressure chronic ulcer of other part of left lower leg with fat layer exposed: Secondary | ICD-10-CM | POA: Diagnosis not present

## 2020-02-16 DIAGNOSIS — M316 Other giant cell arteritis: Secondary | ICD-10-CM | POA: Diagnosis not present

## 2020-02-16 DIAGNOSIS — G20 Parkinson's disease: Secondary | ICD-10-CM | POA: Diagnosis not present

## 2020-02-16 DIAGNOSIS — E113412 Type 2 diabetes mellitus with severe nonproliferative diabetic retinopathy with macular edema, left eye: Secondary | ICD-10-CM | POA: Diagnosis not present

## 2020-02-16 DIAGNOSIS — R109 Unspecified abdominal pain: Secondary | ICD-10-CM | POA: Diagnosis not present

## 2020-02-16 DIAGNOSIS — J45909 Unspecified asthma, uncomplicated: Secondary | ICD-10-CM | POA: Diagnosis not present

## 2020-02-16 DIAGNOSIS — I63342 Cerebral infarction due to thrombosis of left cerebellar artery: Secondary | ICD-10-CM | POA: Diagnosis not present

## 2020-02-16 DIAGNOSIS — I071 Rheumatic tricuspid insufficiency: Secondary | ICD-10-CM | POA: Diagnosis not present

## 2020-02-16 DIAGNOSIS — K259 Gastric ulcer, unspecified as acute or chronic, without hemorrhage or perforation: Secondary | ICD-10-CM | POA: Diagnosis not present

## 2020-02-16 DIAGNOSIS — M79644 Pain in right finger(s): Secondary | ICD-10-CM | POA: Diagnosis not present

## 2020-02-16 DIAGNOSIS — M5137 Other intervertebral disc degeneration, lumbosacral region: Secondary | ICD-10-CM | POA: Diagnosis not present

## 2020-02-16 DIAGNOSIS — E113511 Type 2 diabetes mellitus with proliferative diabetic retinopathy with macular edema, right eye: Secondary | ICD-10-CM | POA: Diagnosis not present

## 2020-02-16 DIAGNOSIS — I480 Paroxysmal atrial fibrillation: Secondary | ICD-10-CM | POA: Diagnosis not present

## 2020-02-16 DIAGNOSIS — H3530 Unspecified macular degeneration: Secondary | ICD-10-CM | POA: Diagnosis not present

## 2020-02-16 DIAGNOSIS — G4719 Other hypersomnia: Secondary | ICD-10-CM | POA: Diagnosis not present

## 2020-02-16 DIAGNOSIS — Z23 Encounter for immunization: Secondary | ICD-10-CM | POA: Diagnosis not present

## 2020-02-16 DIAGNOSIS — C44612 Basal cell carcinoma of skin of right upper limb, including shoulder: Secondary | ICD-10-CM | POA: Diagnosis not present

## 2020-02-16 DIAGNOSIS — N390 Urinary tract infection, site not specified: Secondary | ICD-10-CM | POA: Diagnosis not present

## 2020-02-16 DIAGNOSIS — I4510 Unspecified right bundle-branch block: Secondary | ICD-10-CM | POA: Diagnosis not present

## 2020-02-16 DIAGNOSIS — G4700 Insomnia, unspecified: Secondary | ICD-10-CM | POA: Diagnosis not present

## 2020-02-16 DIAGNOSIS — C189 Malignant neoplasm of colon, unspecified: Secondary | ICD-10-CM | POA: Diagnosis not present

## 2020-02-16 DIAGNOSIS — Z791 Long term (current) use of non-steroidal anti-inflammatories (NSAID): Secondary | ICD-10-CM | POA: Diagnosis not present

## 2020-02-16 DIAGNOSIS — M531 Cervicobrachial syndrome: Secondary | ICD-10-CM | POA: Diagnosis not present

## 2020-02-16 DIAGNOSIS — I639 Cerebral infarction, unspecified: Secondary | ICD-10-CM | POA: Diagnosis not present

## 2020-02-16 DIAGNOSIS — J986 Disorders of diaphragm: Secondary | ICD-10-CM | POA: Diagnosis not present

## 2020-02-16 DIAGNOSIS — K4090 Unilateral inguinal hernia, without obstruction or gangrene, not specified as recurrent: Secondary | ICD-10-CM | POA: Diagnosis not present

## 2020-02-16 DIAGNOSIS — E1151 Type 2 diabetes mellitus with diabetic peripheral angiopathy without gangrene: Secondary | ICD-10-CM | POA: Diagnosis not present

## 2020-02-16 DIAGNOSIS — I509 Heart failure, unspecified: Secondary | ICD-10-CM | POA: Diagnosis not present

## 2020-02-16 DIAGNOSIS — L97812 Non-pressure chronic ulcer of other part of right lower leg with fat layer exposed: Secondary | ICD-10-CM | POA: Diagnosis not present

## 2020-02-16 DIAGNOSIS — M48061 Spinal stenosis, lumbar region without neurogenic claudication: Secondary | ICD-10-CM | POA: Diagnosis not present

## 2020-02-16 DIAGNOSIS — Z1159 Encounter for screening for other viral diseases: Secondary | ICD-10-CM | POA: Diagnosis not present

## 2020-02-16 DIAGNOSIS — M2042 Other hammer toe(s) (acquired), left foot: Secondary | ICD-10-CM | POA: Diagnosis not present

## 2020-02-16 DIAGNOSIS — R5383 Other fatigue: Secondary | ICD-10-CM | POA: Diagnosis not present

## 2020-02-16 DIAGNOSIS — D696 Thrombocytopenia, unspecified: Secondary | ICD-10-CM | POA: Diagnosis not present

## 2020-02-16 DIAGNOSIS — R479 Unspecified speech disturbances: Secondary | ICD-10-CM | POA: Diagnosis not present

## 2020-02-16 DIAGNOSIS — R3914 Feeling of incomplete bladder emptying: Secondary | ICD-10-CM | POA: Diagnosis not present

## 2020-02-16 DIAGNOSIS — F419 Anxiety disorder, unspecified: Secondary | ICD-10-CM | POA: Diagnosis not present

## 2020-02-16 DIAGNOSIS — Z96652 Presence of left artificial knee joint: Secondary | ICD-10-CM | POA: Diagnosis not present

## 2020-02-16 DIAGNOSIS — I25119 Atherosclerotic heart disease of native coronary artery with unspecified angina pectoris: Secondary | ICD-10-CM | POA: Diagnosis not present

## 2020-02-16 DIAGNOSIS — I10 Essential (primary) hypertension: Secondary | ICD-10-CM | POA: Diagnosis not present

## 2020-02-16 DIAGNOSIS — N400 Enlarged prostate without lower urinary tract symptoms: Secondary | ICD-10-CM | POA: Diagnosis not present

## 2020-02-16 DIAGNOSIS — R262 Difficulty in walking, not elsewhere classified: Secondary | ICD-10-CM | POA: Diagnosis not present

## 2020-02-16 DIAGNOSIS — Z9911 Dependence on respirator [ventilator] status: Secondary | ICD-10-CM | POA: Diagnosis not present

## 2020-02-16 DIAGNOSIS — M532X7 Spinal instabilities, lumbosacral region: Secondary | ICD-10-CM | POA: Diagnosis not present

## 2020-02-16 DIAGNOSIS — I69392 Facial weakness following cerebral infarction: Secondary | ICD-10-CM | POA: Diagnosis not present

## 2020-02-16 DIAGNOSIS — N4342 Spermatocele of epididymis, multiple: Secondary | ICD-10-CM | POA: Diagnosis not present

## 2020-02-16 DIAGNOSIS — R519 Headache, unspecified: Secondary | ICD-10-CM | POA: Diagnosis not present

## 2020-02-16 DIAGNOSIS — R0789 Other chest pain: Secondary | ICD-10-CM | POA: Diagnosis not present

## 2020-02-16 DIAGNOSIS — M8580 Other specified disorders of bone density and structure, unspecified site: Secondary | ICD-10-CM | POA: Diagnosis not present

## 2020-02-16 DIAGNOSIS — N529 Male erectile dysfunction, unspecified: Secondary | ICD-10-CM | POA: Diagnosis not present

## 2020-02-16 DIAGNOSIS — D0362 Melanoma in situ of left upper limb, including shoulder: Secondary | ICD-10-CM | POA: Diagnosis not present

## 2020-02-16 DIAGNOSIS — D0220 Carcinoma in situ of unspecified bronchus and lung: Secondary | ICD-10-CM | POA: Diagnosis not present

## 2020-02-16 DIAGNOSIS — N182 Chronic kidney disease, stage 2 (mild): Secondary | ICD-10-CM | POA: Diagnosis not present

## 2020-02-16 DIAGNOSIS — S2222XD Fracture of body of sternum, subsequent encounter for fracture with routine healing: Secondary | ICD-10-CM | POA: Diagnosis not present

## 2020-02-16 DIAGNOSIS — H52203 Unspecified astigmatism, bilateral: Secondary | ICD-10-CM | POA: Diagnosis not present

## 2020-02-16 DIAGNOSIS — H524 Presbyopia: Secondary | ICD-10-CM | POA: Diagnosis not present

## 2020-02-16 DIAGNOSIS — I87313 Chronic venous hypertension (idiopathic) with ulcer of bilateral lower extremity: Secondary | ICD-10-CM | POA: Diagnosis not present

## 2020-02-16 DIAGNOSIS — C50812 Malignant neoplasm of overlapping sites of left female breast: Secondary | ICD-10-CM | POA: Diagnosis not present

## 2020-02-16 DIAGNOSIS — Z7951 Long term (current) use of inhaled steroids: Secondary | ICD-10-CM | POA: Diagnosis not present

## 2020-02-16 DIAGNOSIS — Z48810 Encounter for surgical aftercare following surgery on the sense organs: Secondary | ICD-10-CM | POA: Diagnosis not present

## 2020-02-16 DIAGNOSIS — Z940 Kidney transplant status: Secondary | ICD-10-CM | POA: Diagnosis not present

## 2020-02-16 DIAGNOSIS — Z95820 Peripheral vascular angioplasty status with implants and grafts: Secondary | ICD-10-CM | POA: Diagnosis not present

## 2020-02-16 DIAGNOSIS — Z6825 Body mass index (BMI) 25.0-25.9, adult: Secondary | ICD-10-CM | POA: Diagnosis not present

## 2020-02-16 DIAGNOSIS — R270 Ataxia, unspecified: Secondary | ICD-10-CM | POA: Diagnosis not present

## 2020-02-16 DIAGNOSIS — E1169 Type 2 diabetes mellitus with other specified complication: Secondary | ICD-10-CM | POA: Diagnosis not present

## 2020-02-16 DIAGNOSIS — R351 Nocturia: Secondary | ICD-10-CM | POA: Diagnosis not present

## 2020-02-16 DIAGNOSIS — S5012XA Contusion of left forearm, initial encounter: Secondary | ICD-10-CM | POA: Diagnosis not present

## 2020-02-16 DIAGNOSIS — N2581 Secondary hyperparathyroidism of renal origin: Secondary | ICD-10-CM | POA: Diagnosis not present

## 2020-02-16 DIAGNOSIS — S72002D Fracture of unspecified part of neck of left femur, subsequent encounter for closed fracture with routine healing: Secondary | ICD-10-CM | POA: Diagnosis not present

## 2020-02-16 DIAGNOSIS — I700 Atherosclerosis of aorta: Secondary | ICD-10-CM | POA: Diagnosis not present

## 2020-02-16 DIAGNOSIS — I110 Hypertensive heart disease with heart failure: Secondary | ICD-10-CM | POA: Diagnosis not present

## 2020-02-16 DIAGNOSIS — Z1389 Encounter for screening for other disorder: Secondary | ICD-10-CM | POA: Diagnosis not present

## 2020-02-16 DIAGNOSIS — M546 Pain in thoracic spine: Secondary | ICD-10-CM | POA: Diagnosis not present

## 2020-02-16 DIAGNOSIS — G43409 Hemiplegic migraine, not intractable, without status migrainosus: Secondary | ICD-10-CM | POA: Diagnosis not present

## 2020-02-16 DIAGNOSIS — G8929 Other chronic pain: Secondary | ICD-10-CM | POA: Diagnosis not present

## 2020-02-16 DIAGNOSIS — C183 Malignant neoplasm of hepatic flexure: Secondary | ICD-10-CM | POA: Diagnosis not present

## 2020-02-16 DIAGNOSIS — Z5111 Encounter for antineoplastic chemotherapy: Secondary | ICD-10-CM | POA: Diagnosis not present

## 2020-02-16 DIAGNOSIS — K5080 Crohn's disease of both small and large intestine without complications: Secondary | ICD-10-CM | POA: Diagnosis not present

## 2020-02-16 DIAGNOSIS — S82102K Unspecified fracture of upper end of left tibia, subsequent encounter for closed fracture with nonunion: Secondary | ICD-10-CM | POA: Diagnosis not present

## 2020-02-16 DIAGNOSIS — G14 Postpolio syndrome: Secondary | ICD-10-CM | POA: Diagnosis not present

## 2020-02-16 DIAGNOSIS — R269 Unspecified abnormalities of gait and mobility: Secondary | ICD-10-CM | POA: Diagnosis not present

## 2020-02-16 DIAGNOSIS — J029 Acute pharyngitis, unspecified: Secondary | ICD-10-CM | POA: Diagnosis not present

## 2020-02-16 DIAGNOSIS — S93432D Sprain of tibiofibular ligament of left ankle, subsequent encounter: Secondary | ICD-10-CM | POA: Diagnosis not present

## 2020-02-16 DIAGNOSIS — M961 Postlaminectomy syndrome, not elsewhere classified: Secondary | ICD-10-CM | POA: Diagnosis not present

## 2020-02-16 DIAGNOSIS — M479 Spondylosis, unspecified: Secondary | ICD-10-CM | POA: Diagnosis not present

## 2020-02-16 DIAGNOSIS — Z6828 Body mass index (BMI) 28.0-28.9, adult: Secondary | ICD-10-CM | POA: Diagnosis not present

## 2020-02-16 DIAGNOSIS — H2512 Age-related nuclear cataract, left eye: Secondary | ICD-10-CM | POA: Diagnosis not present

## 2020-02-16 DIAGNOSIS — Z20828 Contact with and (suspected) exposure to other viral communicable diseases: Secondary | ICD-10-CM | POA: Diagnosis not present

## 2020-02-16 DIAGNOSIS — R7402 Elevation of levels of lactic acid dehydrogenase (LDH): Secondary | ICD-10-CM | POA: Diagnosis not present

## 2020-02-16 DIAGNOSIS — N186 End stage renal disease: Secondary | ICD-10-CM | POA: Diagnosis not present

## 2020-02-16 DIAGNOSIS — G2581 Restless legs syndrome: Secondary | ICD-10-CM | POA: Diagnosis not present

## 2020-02-16 DIAGNOSIS — M85852 Other specified disorders of bone density and structure, left thigh: Secondary | ICD-10-CM | POA: Diagnosis not present

## 2020-02-16 DIAGNOSIS — Z0000 Encounter for general adult medical examination without abnormal findings: Secondary | ICD-10-CM | POA: Diagnosis not present

## 2020-02-16 DIAGNOSIS — N838 Other noninflammatory disorders of ovary, fallopian tube and broad ligament: Secondary | ICD-10-CM | POA: Diagnosis not present

## 2020-02-16 DIAGNOSIS — R1032 Left lower quadrant pain: Secondary | ICD-10-CM | POA: Diagnosis not present

## 2020-02-16 DIAGNOSIS — J302 Other seasonal allergic rhinitis: Secondary | ICD-10-CM | POA: Diagnosis not present

## 2020-02-16 DIAGNOSIS — I69354 Hemiplegia and hemiparesis following cerebral infarction affecting left non-dominant side: Secondary | ICD-10-CM | POA: Diagnosis not present

## 2020-02-16 DIAGNOSIS — R432 Parageusia: Secondary | ICD-10-CM | POA: Diagnosis not present

## 2020-02-16 DIAGNOSIS — R42 Dizziness and giddiness: Secondary | ICD-10-CM | POA: Diagnosis not present

## 2020-02-16 DIAGNOSIS — J479 Bronchiectasis, uncomplicated: Secondary | ICD-10-CM | POA: Diagnosis not present

## 2020-02-16 DIAGNOSIS — H409 Unspecified glaucoma: Secondary | ICD-10-CM | POA: Diagnosis not present

## 2020-02-16 DIAGNOSIS — F4312 Post-traumatic stress disorder, chronic: Secondary | ICD-10-CM | POA: Diagnosis not present

## 2020-02-16 DIAGNOSIS — C184 Malignant neoplasm of transverse colon: Secondary | ICD-10-CM | POA: Diagnosis not present

## 2020-02-16 DIAGNOSIS — F17200 Nicotine dependence, unspecified, uncomplicated: Secondary | ICD-10-CM | POA: Diagnosis not present

## 2020-02-16 DIAGNOSIS — F4542 Pain disorder with related psychological factors: Secondary | ICD-10-CM | POA: Diagnosis not present

## 2020-02-16 DIAGNOSIS — I25110 Atherosclerotic heart disease of native coronary artery with unstable angina pectoris: Secondary | ICD-10-CM | POA: Diagnosis not present

## 2020-02-16 DIAGNOSIS — M25511 Pain in right shoulder: Secondary | ICD-10-CM | POA: Diagnosis not present

## 2020-02-16 DIAGNOSIS — M170 Bilateral primary osteoarthritis of knee: Secondary | ICD-10-CM | POA: Diagnosis not present

## 2020-02-16 DIAGNOSIS — J9601 Acute respiratory failure with hypoxia: Secondary | ICD-10-CM | POA: Diagnosis not present

## 2020-02-16 DIAGNOSIS — Z9012 Acquired absence of left breast and nipple: Secondary | ICD-10-CM | POA: Diagnosis not present

## 2020-02-16 DIAGNOSIS — M65341 Trigger finger, right ring finger: Secondary | ICD-10-CM | POA: Diagnosis not present

## 2020-02-16 DIAGNOSIS — C50919 Malignant neoplasm of unspecified site of unspecified female breast: Secondary | ICD-10-CM | POA: Diagnosis not present

## 2020-02-16 DIAGNOSIS — E669 Obesity, unspecified: Secondary | ICD-10-CM | POA: Diagnosis not present

## 2020-02-16 DIAGNOSIS — D127 Benign neoplasm of rectosigmoid junction: Secondary | ICD-10-CM | POA: Diagnosis not present

## 2020-02-16 DIAGNOSIS — M25562 Pain in left knee: Secondary | ICD-10-CM | POA: Diagnosis not present

## 2020-02-16 DIAGNOSIS — R002 Palpitations: Secondary | ICD-10-CM | POA: Diagnosis not present

## 2020-02-16 DIAGNOSIS — E7841 Elevated Lipoprotein(a): Secondary | ICD-10-CM | POA: Diagnosis not present

## 2020-02-16 DIAGNOSIS — R739 Hyperglycemia, unspecified: Secondary | ICD-10-CM | POA: Diagnosis not present

## 2020-02-16 DIAGNOSIS — E11319 Type 2 diabetes mellitus with unspecified diabetic retinopathy without macular edema: Secondary | ICD-10-CM | POA: Diagnosis not present

## 2020-02-16 DIAGNOSIS — M898X9 Other specified disorders of bone, unspecified site: Secondary | ICD-10-CM | POA: Diagnosis not present

## 2020-02-16 DIAGNOSIS — N170 Acute kidney failure with tubular necrosis: Secondary | ICD-10-CM | POA: Diagnosis not present

## 2020-02-16 DIAGNOSIS — E278 Other specified disorders of adrenal gland: Secondary | ICD-10-CM | POA: Diagnosis not present

## 2020-02-16 DIAGNOSIS — J45998 Other asthma: Secondary | ICD-10-CM | POA: Diagnosis not present

## 2020-02-16 DIAGNOSIS — R569 Unspecified convulsions: Secondary | ICD-10-CM | POA: Diagnosis not present

## 2020-02-16 DIAGNOSIS — Z96643 Presence of artificial hip joint, bilateral: Secondary | ICD-10-CM | POA: Diagnosis not present

## 2020-02-16 DIAGNOSIS — H9209 Otalgia, unspecified ear: Secondary | ICD-10-CM | POA: Diagnosis not present

## 2020-02-16 DIAGNOSIS — E236 Other disorders of pituitary gland: Secondary | ICD-10-CM | POA: Diagnosis not present

## 2020-02-16 DIAGNOSIS — I2583 Coronary atherosclerosis due to lipid rich plaque: Secondary | ICD-10-CM | POA: Diagnosis not present

## 2020-02-16 DIAGNOSIS — I4819 Other persistent atrial fibrillation: Secondary | ICD-10-CM | POA: Diagnosis not present

## 2020-02-16 DIAGNOSIS — D128 Benign neoplasm of rectum: Secondary | ICD-10-CM | POA: Diagnosis not present

## 2020-02-16 DIAGNOSIS — H25811 Combined forms of age-related cataract, right eye: Secondary | ICD-10-CM | POA: Diagnosis not present

## 2020-02-16 DIAGNOSIS — M1611 Unilateral primary osteoarthritis, right hip: Secondary | ICD-10-CM | POA: Diagnosis not present

## 2020-02-16 DIAGNOSIS — F1190 Opioid use, unspecified, uncomplicated: Secondary | ICD-10-CM | POA: Diagnosis not present

## 2020-02-16 DIAGNOSIS — Z951 Presence of aortocoronary bypass graft: Secondary | ICD-10-CM | POA: Diagnosis not present

## 2020-02-16 DIAGNOSIS — N3946 Mixed incontinence: Secondary | ICD-10-CM | POA: Diagnosis not present

## 2020-02-16 DIAGNOSIS — I471 Supraventricular tachycardia: Secondary | ICD-10-CM | POA: Diagnosis not present

## 2020-02-16 DIAGNOSIS — H9190 Unspecified hearing loss, unspecified ear: Secondary | ICD-10-CM | POA: Diagnosis not present

## 2020-02-16 DIAGNOSIS — I5032 Chronic diastolic (congestive) heart failure: Secondary | ICD-10-CM | POA: Diagnosis not present

## 2020-02-16 DIAGNOSIS — M533 Sacrococcygeal disorders, not elsewhere classified: Secondary | ICD-10-CM | POA: Diagnosis not present

## 2020-02-16 DIAGNOSIS — Z9989 Dependence on other enabling machines and devices: Secondary | ICD-10-CM | POA: Diagnosis not present

## 2020-02-16 DIAGNOSIS — E782 Mixed hyperlipidemia: Secondary | ICD-10-CM | POA: Diagnosis not present

## 2020-02-16 DIAGNOSIS — G5602 Carpal tunnel syndrome, left upper limb: Secondary | ICD-10-CM | POA: Diagnosis not present

## 2020-02-16 DIAGNOSIS — I214 Non-ST elevation (NSTEMI) myocardial infarction: Secondary | ICD-10-CM | POA: Diagnosis not present

## 2020-02-16 DIAGNOSIS — M5030 Other cervical disc degeneration, unspecified cervical region: Secondary | ICD-10-CM | POA: Diagnosis not present

## 2020-02-16 DIAGNOSIS — E8770 Fluid overload, unspecified: Secondary | ICD-10-CM | POA: Diagnosis not present

## 2020-02-16 DIAGNOSIS — Z955 Presence of coronary angioplasty implant and graft: Secondary | ICD-10-CM | POA: Diagnosis not present

## 2020-02-16 DIAGNOSIS — N63 Unspecified lump in unspecified breast: Secondary | ICD-10-CM | POA: Diagnosis not present

## 2020-02-16 DIAGNOSIS — M5126 Other intervertebral disc displacement, lumbar region: Secondary | ICD-10-CM | POA: Diagnosis not present

## 2020-02-16 DIAGNOSIS — D3141 Benign neoplasm of right ciliary body: Secondary | ICD-10-CM | POA: Diagnosis not present

## 2020-02-16 DIAGNOSIS — E871 Hypo-osmolality and hyponatremia: Secondary | ICD-10-CM | POA: Diagnosis not present

## 2020-02-16 DIAGNOSIS — M189 Osteoarthritis of first carpometacarpal joint, unspecified: Secondary | ICD-10-CM | POA: Diagnosis not present

## 2020-02-16 DIAGNOSIS — Z7189 Other specified counseling: Secondary | ICD-10-CM | POA: Diagnosis not present

## 2020-02-16 DIAGNOSIS — H5462 Unqualified visual loss, left eye, normal vision right eye: Secondary | ICD-10-CM | POA: Diagnosis not present

## 2020-02-16 DIAGNOSIS — G35 Multiple sclerosis: Secondary | ICD-10-CM | POA: Diagnosis not present

## 2020-02-16 DIAGNOSIS — H353131 Nonexudative age-related macular degeneration, bilateral, early dry stage: Secondary | ICD-10-CM | POA: Diagnosis not present

## 2020-02-16 DIAGNOSIS — H25812 Combined forms of age-related cataract, left eye: Secondary | ICD-10-CM | POA: Diagnosis not present

## 2020-02-16 DIAGNOSIS — D500 Iron deficiency anemia secondary to blood loss (chronic): Secondary | ICD-10-CM | POA: Diagnosis not present

## 2020-02-16 DIAGNOSIS — J45901 Unspecified asthma with (acute) exacerbation: Secondary | ICD-10-CM | POA: Diagnosis not present

## 2020-02-16 DIAGNOSIS — K589 Irritable bowel syndrome without diarrhea: Secondary | ICD-10-CM | POA: Diagnosis not present

## 2020-02-16 DIAGNOSIS — E663 Overweight: Secondary | ICD-10-CM | POA: Diagnosis not present

## 2020-02-16 DIAGNOSIS — I77819 Aortic ectasia, unspecified site: Secondary | ICD-10-CM | POA: Diagnosis not present

## 2020-02-16 DIAGNOSIS — I25810 Atherosclerosis of coronary artery bypass graft(s) without angina pectoris: Secondary | ICD-10-CM | POA: Diagnosis not present

## 2020-02-16 DIAGNOSIS — M791 Myalgia, unspecified site: Secondary | ICD-10-CM | POA: Diagnosis not present

## 2020-02-16 DIAGNOSIS — Z86711 Personal history of pulmonary embolism: Secondary | ICD-10-CM | POA: Diagnosis not present

## 2020-02-16 DIAGNOSIS — I779 Disorder of arteries and arterioles, unspecified: Secondary | ICD-10-CM | POA: Diagnosis not present

## 2020-02-16 DIAGNOSIS — F3131 Bipolar disorder, current episode depressed, mild: Secondary | ICD-10-CM | POA: Diagnosis not present

## 2020-02-16 DIAGNOSIS — Z7952 Long term (current) use of systemic steroids: Secondary | ICD-10-CM | POA: Diagnosis not present

## 2020-02-16 DIAGNOSIS — H9202 Otalgia, left ear: Secondary | ICD-10-CM | POA: Diagnosis not present

## 2020-02-16 DIAGNOSIS — I9589 Other hypotension: Secondary | ICD-10-CM | POA: Diagnosis not present

## 2020-02-16 DIAGNOSIS — E519 Thiamine deficiency, unspecified: Secondary | ICD-10-CM | POA: Diagnosis not present

## 2020-02-16 DIAGNOSIS — R001 Bradycardia, unspecified: Secondary | ICD-10-CM | POA: Diagnosis not present

## 2020-02-16 DIAGNOSIS — E875 Hyperkalemia: Secondary | ICD-10-CM | POA: Diagnosis not present

## 2020-02-16 DIAGNOSIS — D801 Nonfamilial hypogammaglobulinemia: Secondary | ICD-10-CM | POA: Diagnosis not present

## 2020-02-16 DIAGNOSIS — M79671 Pain in right foot: Secondary | ICD-10-CM | POA: Diagnosis not present

## 2020-02-16 DIAGNOSIS — D329 Benign neoplasm of meninges, unspecified: Secondary | ICD-10-CM | POA: Diagnosis not present

## 2020-02-16 DIAGNOSIS — H04129 Dry eye syndrome of unspecified lacrimal gland: Secondary | ICD-10-CM | POA: Diagnosis not present

## 2020-02-16 DIAGNOSIS — M9903 Segmental and somatic dysfunction of lumbar region: Secondary | ICD-10-CM | POA: Diagnosis not present

## 2020-02-16 DIAGNOSIS — F330 Major depressive disorder, recurrent, mild: Secondary | ICD-10-CM | POA: Diagnosis not present

## 2020-02-16 DIAGNOSIS — J309 Allergic rhinitis, unspecified: Secondary | ICD-10-CM | POA: Diagnosis not present

## 2020-02-16 DIAGNOSIS — Z8781 Personal history of (healed) traumatic fracture: Secondary | ICD-10-CM | POA: Diagnosis not present

## 2020-02-16 DIAGNOSIS — E213 Hyperparathyroidism, unspecified: Secondary | ICD-10-CM | POA: Diagnosis not present

## 2020-02-16 DIAGNOSIS — R634 Abnormal weight loss: Secondary | ICD-10-CM | POA: Diagnosis not present

## 2020-02-16 DIAGNOSIS — D510 Vitamin B12 deficiency anemia due to intrinsic factor deficiency: Secondary | ICD-10-CM | POA: Diagnosis not present

## 2020-02-16 DIAGNOSIS — J9610 Chronic respiratory failure, unspecified whether with hypoxia or hypercapnia: Secondary | ICD-10-CM | POA: Diagnosis not present

## 2020-02-16 DIAGNOSIS — C786 Secondary malignant neoplasm of retroperitoneum and peritoneum: Secondary | ICD-10-CM | POA: Diagnosis not present

## 2020-02-16 DIAGNOSIS — N812 Incomplete uterovaginal prolapse: Secondary | ICD-10-CM | POA: Diagnosis not present

## 2020-02-16 DIAGNOSIS — U071 COVID-19: Secondary | ICD-10-CM | POA: Diagnosis not present

## 2020-02-16 DIAGNOSIS — Z880 Allergy status to penicillin: Secondary | ICD-10-CM | POA: Diagnosis not present

## 2020-02-16 DIAGNOSIS — D0359 Melanoma in situ of other part of trunk: Secondary | ICD-10-CM | POA: Diagnosis not present

## 2020-02-16 DIAGNOSIS — G4733 Obstructive sleep apnea (adult) (pediatric): Secondary | ICD-10-CM | POA: Diagnosis not present

## 2020-02-16 DIAGNOSIS — M4727 Other spondylosis with radiculopathy, lumbosacral region: Secondary | ICD-10-CM | POA: Diagnosis not present

## 2020-02-16 DIAGNOSIS — R251 Tremor, unspecified: Secondary | ICD-10-CM | POA: Diagnosis not present

## 2020-02-16 DIAGNOSIS — R748 Abnormal levels of other serum enzymes: Secondary | ICD-10-CM | POA: Diagnosis not present

## 2020-02-16 DIAGNOSIS — R0981 Nasal congestion: Secondary | ICD-10-CM | POA: Diagnosis not present

## 2020-02-16 DIAGNOSIS — Z712 Person consulting for explanation of examination or test findings: Secondary | ICD-10-CM | POA: Diagnosis not present

## 2020-02-16 DIAGNOSIS — M1732 Unilateral post-traumatic osteoarthritis, left knee: Secondary | ICD-10-CM | POA: Diagnosis not present

## 2020-02-16 DIAGNOSIS — M25531 Pain in right wrist: Secondary | ICD-10-CM | POA: Diagnosis not present

## 2020-02-16 DIAGNOSIS — I420 Dilated cardiomyopathy: Secondary | ICD-10-CM | POA: Diagnosis not present

## 2020-02-16 DIAGNOSIS — D509 Iron deficiency anemia, unspecified: Secondary | ICD-10-CM | POA: Diagnosis not present

## 2020-02-16 DIAGNOSIS — I5031 Acute diastolic (congestive) heart failure: Secondary | ICD-10-CM | POA: Diagnosis not present

## 2020-02-16 DIAGNOSIS — I447 Left bundle-branch block, unspecified: Secondary | ICD-10-CM | POA: Diagnosis not present

## 2020-02-16 DIAGNOSIS — M62830 Muscle spasm of back: Secondary | ICD-10-CM | POA: Diagnosis not present

## 2020-02-16 DIAGNOSIS — M94261 Chondromalacia, right knee: Secondary | ICD-10-CM | POA: Diagnosis not present

## 2020-02-16 DIAGNOSIS — G43009 Migraine without aura, not intractable, without status migrainosus: Secondary | ICD-10-CM | POA: Diagnosis not present

## 2020-02-16 DIAGNOSIS — M79642 Pain in left hand: Secondary | ICD-10-CM | POA: Diagnosis not present

## 2020-02-16 DIAGNOSIS — R188 Other ascites: Secondary | ICD-10-CM | POA: Diagnosis not present

## 2020-02-16 DIAGNOSIS — J310 Chronic rhinitis: Secondary | ICD-10-CM | POA: Diagnosis not present

## 2020-02-16 DIAGNOSIS — Z8701 Personal history of pneumonia (recurrent): Secondary | ICD-10-CM | POA: Diagnosis not present

## 2020-02-16 DIAGNOSIS — M797 Fibromyalgia: Secondary | ICD-10-CM | POA: Diagnosis not present

## 2020-02-16 DIAGNOSIS — M4302 Spondylolysis, cervical region: Secondary | ICD-10-CM | POA: Diagnosis not present

## 2020-02-16 DIAGNOSIS — Z5181 Encounter for therapeutic drug level monitoring: Secondary | ICD-10-CM | POA: Diagnosis not present

## 2020-02-16 DIAGNOSIS — J449 Chronic obstructive pulmonary disease, unspecified: Secondary | ICD-10-CM | POA: Diagnosis not present

## 2020-02-16 DIAGNOSIS — M5388 Other specified dorsopathies, sacral and sacrococcygeal region: Secondary | ICD-10-CM | POA: Diagnosis not present

## 2020-02-16 DIAGNOSIS — Z961 Presence of intraocular lens: Secondary | ICD-10-CM | POA: Diagnosis not present

## 2020-02-16 DIAGNOSIS — B351 Tinea unguium: Secondary | ICD-10-CM | POA: Diagnosis not present

## 2020-02-16 DIAGNOSIS — J4520 Mild intermittent asthma, uncomplicated: Secondary | ICD-10-CM | POA: Diagnosis not present

## 2020-02-16 DIAGNOSIS — Z96641 Presence of right artificial hip joint: Secondary | ICD-10-CM | POA: Diagnosis not present

## 2020-02-16 DIAGNOSIS — E1165 Type 2 diabetes mellitus with hyperglycemia: Secondary | ICD-10-CM | POA: Diagnosis not present

## 2020-02-16 DIAGNOSIS — M180 Bilateral primary osteoarthritis of first carpometacarpal joints: Secondary | ICD-10-CM | POA: Diagnosis not present

## 2020-02-16 DIAGNOSIS — M4316 Spondylolisthesis, lumbar region: Secondary | ICD-10-CM | POA: Diagnosis not present

## 2020-02-16 DIAGNOSIS — H35363 Drusen (degenerative) of macula, bilateral: Secondary | ICD-10-CM | POA: Diagnosis not present

## 2020-02-16 DIAGNOSIS — H43813 Vitreous degeneration, bilateral: Secondary | ICD-10-CM | POA: Diagnosis not present

## 2020-02-16 DIAGNOSIS — I130 Hypertensive heart and chronic kidney disease with heart failure and stage 1 through stage 4 chronic kidney disease, or unspecified chronic kidney disease: Secondary | ICD-10-CM | POA: Diagnosis not present

## 2020-02-16 DIAGNOSIS — E041 Nontoxic single thyroid nodule: Secondary | ICD-10-CM | POA: Diagnosis not present

## 2020-02-16 DIAGNOSIS — M25512 Pain in left shoulder: Secondary | ICD-10-CM | POA: Diagnosis not present

## 2020-02-16 DIAGNOSIS — E860 Dehydration: Secondary | ICD-10-CM | POA: Diagnosis not present

## 2020-02-16 DIAGNOSIS — M549 Dorsalgia, unspecified: Secondary | ICD-10-CM | POA: Diagnosis not present

## 2020-02-16 DIAGNOSIS — Z602 Problems related to living alone: Secondary | ICD-10-CM | POA: Diagnosis not present

## 2020-02-16 DIAGNOSIS — M25761 Osteophyte, right knee: Secondary | ICD-10-CM | POA: Diagnosis not present

## 2020-02-16 DIAGNOSIS — K760 Fatty (change of) liver, not elsewhere classified: Secondary | ICD-10-CM | POA: Diagnosis not present

## 2020-02-16 DIAGNOSIS — J301 Allergic rhinitis due to pollen: Secondary | ICD-10-CM | POA: Diagnosis not present

## 2020-02-16 DIAGNOSIS — R791 Abnormal coagulation profile: Secondary | ICD-10-CM | POA: Diagnosis not present

## 2020-02-16 DIAGNOSIS — I422 Other hypertrophic cardiomyopathy: Secondary | ICD-10-CM | POA: Diagnosis not present

## 2020-02-16 DIAGNOSIS — K7290 Hepatic failure, unspecified without coma: Secondary | ICD-10-CM | POA: Diagnosis not present

## 2020-02-16 DIAGNOSIS — S0990XA Unspecified injury of head, initial encounter: Secondary | ICD-10-CM | POA: Diagnosis not present

## 2020-02-16 DIAGNOSIS — D120 Benign neoplasm of cecum: Secondary | ICD-10-CM | POA: Diagnosis not present

## 2020-02-16 DIAGNOSIS — J432 Centrilobular emphysema: Secondary | ICD-10-CM | POA: Diagnosis not present

## 2020-02-16 DIAGNOSIS — N1832 Chronic kidney disease, stage 3b: Secondary | ICD-10-CM | POA: Diagnosis not present

## 2020-02-16 DIAGNOSIS — N952 Postmenopausal atrophic vaginitis: Secondary | ICD-10-CM | POA: Diagnosis not present

## 2020-02-16 DIAGNOSIS — G459 Transient cerebral ischemic attack, unspecified: Secondary | ICD-10-CM | POA: Diagnosis not present

## 2020-02-16 DIAGNOSIS — K8020 Calculus of gallbladder without cholecystitis without obstruction: Secondary | ICD-10-CM | POA: Diagnosis not present

## 2020-02-16 DIAGNOSIS — C50412 Malignant neoplasm of upper-outer quadrant of left female breast: Secondary | ICD-10-CM | POA: Diagnosis not present

## 2020-02-16 DIAGNOSIS — D123 Benign neoplasm of transverse colon: Secondary | ICD-10-CM | POA: Diagnosis not present

## 2020-02-16 DIAGNOSIS — L819 Disorder of pigmentation, unspecified: Secondary | ICD-10-CM | POA: Diagnosis not present

## 2020-02-17 DIAGNOSIS — I83009 Varicose veins of unspecified lower extremity with ulcer of unspecified site: Secondary | ICD-10-CM | POA: Diagnosis not present

## 2020-02-17 DIAGNOSIS — Z9049 Acquired absence of other specified parts of digestive tract: Secondary | ICD-10-CM | POA: Diagnosis not present

## 2020-02-17 DIAGNOSIS — I5020 Unspecified systolic (congestive) heart failure: Secondary | ICD-10-CM | POA: Diagnosis not present

## 2020-02-17 DIAGNOSIS — M9080 Osteopathy in diseases classified elsewhere, unspecified site: Secondary | ICD-10-CM | POA: Diagnosis not present

## 2020-02-17 DIAGNOSIS — R310 Gross hematuria: Secondary | ICD-10-CM | POA: Diagnosis not present

## 2020-02-17 DIAGNOSIS — Z6829 Body mass index (BMI) 29.0-29.9, adult: Secondary | ICD-10-CM | POA: Diagnosis not present

## 2020-02-17 DIAGNOSIS — M79674 Pain in right toe(s): Secondary | ICD-10-CM | POA: Diagnosis not present

## 2020-02-17 DIAGNOSIS — I1310 Hypertensive heart and chronic kidney disease without heart failure, with stage 1 through stage 4 chronic kidney disease, or unspecified chronic kidney disease: Secondary | ICD-10-CM | POA: Diagnosis not present

## 2020-02-17 DIAGNOSIS — H0102B Squamous blepharitis left eye, upper and lower eyelids: Secondary | ICD-10-CM | POA: Diagnosis not present

## 2020-02-17 DIAGNOSIS — N139 Obstructive and reflux uropathy, unspecified: Secondary | ICD-10-CM | POA: Diagnosis not present

## 2020-02-17 DIAGNOSIS — J438 Other emphysema: Secondary | ICD-10-CM | POA: Diagnosis not present

## 2020-02-17 DIAGNOSIS — K52831 Collagenous colitis: Secondary | ICD-10-CM | POA: Diagnosis not present

## 2020-02-17 DIAGNOSIS — Z03818 Encounter for observation for suspected exposure to other biological agents ruled out: Secondary | ICD-10-CM | POA: Diagnosis not present

## 2020-02-17 DIAGNOSIS — K861 Other chronic pancreatitis: Secondary | ICD-10-CM | POA: Diagnosis not present

## 2020-02-17 DIAGNOSIS — J432 Centrilobular emphysema: Secondary | ICD-10-CM | POA: Diagnosis not present

## 2020-02-17 DIAGNOSIS — H54415A Blindness right eye category 5, normal vision left eye: Secondary | ICD-10-CM | POA: Diagnosis not present

## 2020-02-17 DIAGNOSIS — R0600 Dyspnea, unspecified: Secondary | ICD-10-CM | POA: Diagnosis not present

## 2020-02-17 DIAGNOSIS — M531 Cervicobrachial syndrome: Secondary | ICD-10-CM | POA: Diagnosis not present

## 2020-02-17 DIAGNOSIS — E119 Type 2 diabetes mellitus without complications: Secondary | ICD-10-CM | POA: Diagnosis not present

## 2020-02-17 DIAGNOSIS — S39012A Strain of muscle, fascia and tendon of lower back, initial encounter: Secondary | ICD-10-CM | POA: Diagnosis not present

## 2020-02-17 DIAGNOSIS — J8410 Pulmonary fibrosis, unspecified: Secondary | ICD-10-CM | POA: Diagnosis not present

## 2020-02-17 DIAGNOSIS — G9341 Metabolic encephalopathy: Secondary | ICD-10-CM | POA: Diagnosis not present

## 2020-02-17 DIAGNOSIS — R32 Unspecified urinary incontinence: Secondary | ICD-10-CM | POA: Diagnosis not present

## 2020-02-17 DIAGNOSIS — S6991XS Unspecified injury of right wrist, hand and finger(s), sequela: Secondary | ICD-10-CM | POA: Diagnosis not present

## 2020-02-17 DIAGNOSIS — L919 Hypertrophic disorder of the skin, unspecified: Secondary | ICD-10-CM | POA: Diagnosis not present

## 2020-02-17 DIAGNOSIS — Z823 Family history of stroke: Secondary | ICD-10-CM | POA: Diagnosis not present

## 2020-02-17 DIAGNOSIS — Z5320 Procedure and treatment not carried out because of patient's decision for unspecified reasons: Secondary | ICD-10-CM | POA: Diagnosis not present

## 2020-02-17 DIAGNOSIS — R2681 Unsteadiness on feet: Secondary | ICD-10-CM | POA: Diagnosis not present

## 2020-02-17 DIAGNOSIS — H0288A Meibomian gland dysfunction right eye, upper and lower eyelids: Secondary | ICD-10-CM | POA: Diagnosis not present

## 2020-02-17 DIAGNOSIS — F330 Major depressive disorder, recurrent, mild: Secondary | ICD-10-CM | POA: Diagnosis not present

## 2020-02-17 DIAGNOSIS — H35453 Secondary pigmentary degeneration, bilateral: Secondary | ICD-10-CM | POA: Diagnosis not present

## 2020-02-17 DIAGNOSIS — H11133 Conjunctival pigmentations, bilateral: Secondary | ICD-10-CM | POA: Diagnosis not present

## 2020-02-17 DIAGNOSIS — Z433 Encounter for attention to colostomy: Secondary | ICD-10-CM | POA: Diagnosis not present

## 2020-02-17 DIAGNOSIS — Z9181 History of falling: Secondary | ICD-10-CM | POA: Diagnosis not present

## 2020-02-17 DIAGNOSIS — E2740 Unspecified adrenocortical insufficiency: Secondary | ICD-10-CM | POA: Diagnosis not present

## 2020-02-17 DIAGNOSIS — K7469 Other cirrhosis of liver: Secondary | ICD-10-CM | POA: Diagnosis not present

## 2020-02-17 DIAGNOSIS — Z66 Do not resuscitate: Secondary | ICD-10-CM | POA: Diagnosis not present

## 2020-02-17 DIAGNOSIS — R4781 Slurred speech: Secondary | ICD-10-CM | POA: Diagnosis not present

## 2020-02-17 DIAGNOSIS — R339 Retention of urine, unspecified: Secondary | ICD-10-CM | POA: Diagnosis not present

## 2020-02-17 DIAGNOSIS — R1013 Epigastric pain: Secondary | ICD-10-CM | POA: Diagnosis not present

## 2020-02-17 DIAGNOSIS — M160 Bilateral primary osteoarthritis of hip: Secondary | ICD-10-CM | POA: Diagnosis not present

## 2020-02-17 DIAGNOSIS — Z5181 Encounter for therapeutic drug level monitoring: Secondary | ICD-10-CM | POA: Diagnosis not present

## 2020-02-17 DIAGNOSIS — G43019 Migraine without aura, intractable, without status migrainosus: Secondary | ICD-10-CM | POA: Diagnosis not present

## 2020-02-17 DIAGNOSIS — C7889 Secondary malignant neoplasm of other digestive organs: Secondary | ICD-10-CM | POA: Diagnosis not present

## 2020-02-17 DIAGNOSIS — F17210 Nicotine dependence, cigarettes, uncomplicated: Secondary | ICD-10-CM | POA: Diagnosis not present

## 2020-02-17 DIAGNOSIS — J449 Chronic obstructive pulmonary disease, unspecified: Secondary | ICD-10-CM | POA: Diagnosis not present

## 2020-02-17 DIAGNOSIS — S2220XA Unspecified fracture of sternum, initial encounter for closed fracture: Secondary | ICD-10-CM | POA: Diagnosis not present

## 2020-02-17 DIAGNOSIS — M12811 Other specific arthropathies, not elsewhere classified, right shoulder: Secondary | ICD-10-CM | POA: Diagnosis not present

## 2020-02-17 DIAGNOSIS — E1129 Type 2 diabetes mellitus with other diabetic kidney complication: Secondary | ICD-10-CM | POA: Diagnosis not present

## 2020-02-17 DIAGNOSIS — Z7982 Long term (current) use of aspirin: Secondary | ICD-10-CM | POA: Diagnosis not present

## 2020-02-17 DIAGNOSIS — B0229 Other postherpetic nervous system involvement: Secondary | ICD-10-CM | POA: Diagnosis not present

## 2020-02-17 DIAGNOSIS — J9811 Atelectasis: Secondary | ICD-10-CM | POA: Diagnosis not present

## 2020-02-17 DIAGNOSIS — N132 Hydronephrosis with renal and ureteral calculous obstruction: Secondary | ICD-10-CM | POA: Diagnosis not present

## 2020-02-17 DIAGNOSIS — Z515 Encounter for palliative care: Secondary | ICD-10-CM | POA: Diagnosis not present

## 2020-02-17 DIAGNOSIS — I441 Atrioventricular block, second degree: Secondary | ICD-10-CM | POA: Diagnosis not present

## 2020-02-17 DIAGNOSIS — M5440 Lumbago with sciatica, unspecified side: Secondary | ICD-10-CM | POA: Diagnosis not present

## 2020-02-17 DIAGNOSIS — Z801 Family history of malignant neoplasm of trachea, bronchus and lung: Secondary | ICD-10-CM | POA: Diagnosis not present

## 2020-02-17 DIAGNOSIS — F0390 Unspecified dementia without behavioral disturbance: Secondary | ICD-10-CM | POA: Diagnosis not present

## 2020-02-17 DIAGNOSIS — E1169 Type 2 diabetes mellitus with other specified complication: Secondary | ICD-10-CM | POA: Diagnosis not present

## 2020-02-17 DIAGNOSIS — N529 Male erectile dysfunction, unspecified: Secondary | ICD-10-CM | POA: Diagnosis not present

## 2020-02-17 DIAGNOSIS — M9901 Segmental and somatic dysfunction of cervical region: Secondary | ICD-10-CM | POA: Diagnosis not present

## 2020-02-17 DIAGNOSIS — H9192 Unspecified hearing loss, left ear: Secondary | ICD-10-CM | POA: Diagnosis not present

## 2020-02-17 DIAGNOSIS — E1151 Type 2 diabetes mellitus with diabetic peripheral angiopathy without gangrene: Secondary | ICD-10-CM | POA: Diagnosis not present

## 2020-02-17 DIAGNOSIS — Z882 Allergy status to sulfonamides status: Secondary | ICD-10-CM | POA: Diagnosis not present

## 2020-02-17 DIAGNOSIS — H9190 Unspecified hearing loss, unspecified ear: Secondary | ICD-10-CM | POA: Diagnosis not present

## 2020-02-17 DIAGNOSIS — I83811 Varicose veins of right lower extremities with pain: Secondary | ICD-10-CM | POA: Diagnosis not present

## 2020-02-17 DIAGNOSIS — K648 Other hemorrhoids: Secondary | ICD-10-CM | POA: Diagnosis not present

## 2020-02-17 DIAGNOSIS — I82411 Acute embolism and thrombosis of right femoral vein: Secondary | ICD-10-CM | POA: Diagnosis not present

## 2020-02-17 DIAGNOSIS — M5414 Radiculopathy, thoracic region: Secondary | ICD-10-CM | POA: Diagnosis not present

## 2020-02-17 DIAGNOSIS — Z992 Dependence on renal dialysis: Secondary | ICD-10-CM | POA: Diagnosis not present

## 2020-02-17 DIAGNOSIS — G4730 Sleep apnea, unspecified: Secondary | ICD-10-CM | POA: Diagnosis not present

## 2020-02-17 DIAGNOSIS — Z905 Acquired absence of kidney: Secondary | ICD-10-CM | POA: Diagnosis not present

## 2020-02-17 DIAGNOSIS — K8010 Calculus of gallbladder with chronic cholecystitis without obstruction: Secondary | ICD-10-CM | POA: Diagnosis not present

## 2020-02-17 DIAGNOSIS — J40 Bronchitis, not specified as acute or chronic: Secondary | ICD-10-CM | POA: Diagnosis not present

## 2020-02-17 DIAGNOSIS — H903 Sensorineural hearing loss, bilateral: Secondary | ICD-10-CM | POA: Diagnosis not present

## 2020-02-17 DIAGNOSIS — D485 Neoplasm of uncertain behavior of skin: Secondary | ICD-10-CM | POA: Diagnosis not present

## 2020-02-17 DIAGNOSIS — H401124 Primary open-angle glaucoma, left eye, indeterminate stage: Secondary | ICD-10-CM | POA: Diagnosis not present

## 2020-02-17 DIAGNOSIS — G8194 Hemiplegia, unspecified affecting left nondominant side: Secondary | ICD-10-CM | POA: Diagnosis not present

## 2020-02-17 DIAGNOSIS — N189 Chronic kidney disease, unspecified: Secondary | ICD-10-CM | POA: Diagnosis not present

## 2020-02-17 DIAGNOSIS — H25813 Combined forms of age-related cataract, bilateral: Secondary | ICD-10-CM | POA: Diagnosis not present

## 2020-02-17 DIAGNOSIS — M7631 Iliotibial band syndrome, right leg: Secondary | ICD-10-CM | POA: Diagnosis not present

## 2020-02-17 DIAGNOSIS — I25119 Atherosclerotic heart disease of native coronary artery with unspecified angina pectoris: Secondary | ICD-10-CM | POA: Diagnosis not present

## 2020-02-17 DIAGNOSIS — S46819A Strain of other muscles, fascia and tendons at shoulder and upper arm level, unspecified arm, initial encounter: Secondary | ICD-10-CM | POA: Diagnosis not present

## 2020-02-17 DIAGNOSIS — L821 Other seborrheic keratosis: Secondary | ICD-10-CM | POA: Diagnosis not present

## 2020-02-17 DIAGNOSIS — E1042 Type 1 diabetes mellitus with diabetic polyneuropathy: Secondary | ICD-10-CM | POA: Diagnosis not present

## 2020-02-17 DIAGNOSIS — I471 Supraventricular tachycardia: Secondary | ICD-10-CM | POA: Diagnosis not present

## 2020-02-17 DIAGNOSIS — T50905A Adverse effect of unspecified drugs, medicaments and biological substances, initial encounter: Secondary | ICD-10-CM | POA: Diagnosis not present

## 2020-02-17 DIAGNOSIS — L089 Local infection of the skin and subcutaneous tissue, unspecified: Secondary | ICD-10-CM | POA: Diagnosis not present

## 2020-02-17 DIAGNOSIS — U071 COVID-19: Secondary | ICD-10-CM | POA: Diagnosis not present

## 2020-02-17 DIAGNOSIS — T466X5A Adverse effect of antihyperlipidemic and antiarteriosclerotic drugs, initial encounter: Secondary | ICD-10-CM | POA: Diagnosis not present

## 2020-02-17 DIAGNOSIS — Z1389 Encounter for screening for other disorder: Secondary | ICD-10-CM | POA: Diagnosis not present

## 2020-02-17 DIAGNOSIS — F411 Generalized anxiety disorder: Secondary | ICD-10-CM | POA: Diagnosis not present

## 2020-02-17 DIAGNOSIS — M3320 Polymyositis, organ involvement unspecified: Secondary | ICD-10-CM | POA: Diagnosis not present

## 2020-02-17 DIAGNOSIS — Z299 Encounter for prophylactic measures, unspecified: Secondary | ICD-10-CM | POA: Diagnosis not present

## 2020-02-17 DIAGNOSIS — F3340 Major depressive disorder, recurrent, in remission, unspecified: Secondary | ICD-10-CM | POA: Diagnosis not present

## 2020-02-17 DIAGNOSIS — M62830 Muscle spasm of back: Secondary | ICD-10-CM | POA: Diagnosis not present

## 2020-02-17 DIAGNOSIS — C180 Malignant neoplasm of cecum: Secondary | ICD-10-CM | POA: Diagnosis not present

## 2020-02-17 DIAGNOSIS — E118 Type 2 diabetes mellitus with unspecified complications: Secondary | ICD-10-CM | POA: Diagnosis not present

## 2020-02-17 DIAGNOSIS — G4451 Hemicrania continua: Secondary | ICD-10-CM | POA: Diagnosis not present

## 2020-02-17 DIAGNOSIS — B159 Hepatitis A without hepatic coma: Secondary | ICD-10-CM | POA: Diagnosis not present

## 2020-02-17 DIAGNOSIS — R5381 Other malaise: Secondary | ICD-10-CM | POA: Diagnosis not present

## 2020-02-17 DIAGNOSIS — Z902 Acquired absence of lung [part of]: Secondary | ICD-10-CM | POA: Diagnosis not present

## 2020-02-17 DIAGNOSIS — R918 Other nonspecific abnormal finding of lung field: Secondary | ICD-10-CM | POA: Diagnosis not present

## 2020-02-17 DIAGNOSIS — L905 Scar conditions and fibrosis of skin: Secondary | ICD-10-CM | POA: Diagnosis not present

## 2020-02-17 DIAGNOSIS — N403 Nodular prostate with lower urinary tract symptoms: Secondary | ICD-10-CM | POA: Diagnosis not present

## 2020-02-17 DIAGNOSIS — M5135 Other intervertebral disc degeneration, thoracolumbar region: Secondary | ICD-10-CM | POA: Diagnosis not present

## 2020-02-17 DIAGNOSIS — H04123 Dry eye syndrome of bilateral lacrimal glands: Secondary | ICD-10-CM | POA: Diagnosis not present

## 2020-02-17 DIAGNOSIS — Z96652 Presence of left artificial knee joint: Secondary | ICD-10-CM | POA: Diagnosis not present

## 2020-02-17 DIAGNOSIS — H6123 Impacted cerumen, bilateral: Secondary | ICD-10-CM | POA: Diagnosis not present

## 2020-02-17 DIAGNOSIS — D124 Benign neoplasm of descending colon: Secondary | ICD-10-CM | POA: Diagnosis not present

## 2020-02-17 DIAGNOSIS — M25461 Effusion, right knee: Secondary | ICD-10-CM | POA: Diagnosis not present

## 2020-02-17 DIAGNOSIS — N3946 Mixed incontinence: Secondary | ICD-10-CM | POA: Diagnosis not present

## 2020-02-17 DIAGNOSIS — I6501 Occlusion and stenosis of right vertebral artery: Secondary | ICD-10-CM | POA: Diagnosis not present

## 2020-02-17 DIAGNOSIS — Z7189 Other specified counseling: Secondary | ICD-10-CM | POA: Diagnosis not present

## 2020-02-17 DIAGNOSIS — K219 Gastro-esophageal reflux disease without esophagitis: Secondary | ICD-10-CM | POA: Diagnosis not present

## 2020-02-17 DIAGNOSIS — R569 Unspecified convulsions: Secondary | ICD-10-CM | POA: Diagnosis not present

## 2020-02-17 DIAGNOSIS — Z85828 Personal history of other malignant neoplasm of skin: Secondary | ICD-10-CM | POA: Diagnosis not present

## 2020-02-17 DIAGNOSIS — I781 Nevus, non-neoplastic: Secondary | ICD-10-CM | POA: Diagnosis not present

## 2020-02-17 DIAGNOSIS — I708 Atherosclerosis of other arteries: Secondary | ICD-10-CM | POA: Diagnosis not present

## 2020-02-17 DIAGNOSIS — Z8521 Personal history of malignant neoplasm of larynx: Secondary | ICD-10-CM | POA: Diagnosis not present

## 2020-02-17 DIAGNOSIS — J45901 Unspecified asthma with (acute) exacerbation: Secondary | ICD-10-CM | POA: Diagnosis not present

## 2020-02-17 DIAGNOSIS — J3089 Other allergic rhinitis: Secondary | ICD-10-CM | POA: Diagnosis not present

## 2020-02-17 DIAGNOSIS — F410 Panic disorder [episodic paroxysmal anxiety] without agoraphobia: Secondary | ICD-10-CM | POA: Diagnosis not present

## 2020-02-17 DIAGNOSIS — Z888 Allergy status to other drugs, medicaments and biological substances status: Secondary | ICD-10-CM | POA: Diagnosis not present

## 2020-02-17 DIAGNOSIS — Z87440 Personal history of urinary (tract) infections: Secondary | ICD-10-CM | POA: Diagnosis not present

## 2020-02-17 DIAGNOSIS — Z7984 Long term (current) use of oral hypoglycemic drugs: Secondary | ICD-10-CM | POA: Diagnosis not present

## 2020-02-17 DIAGNOSIS — E1121 Type 2 diabetes mellitus with diabetic nephropathy: Secondary | ICD-10-CM | POA: Diagnosis not present

## 2020-02-17 DIAGNOSIS — R0902 Hypoxemia: Secondary | ICD-10-CM | POA: Diagnosis not present

## 2020-02-17 DIAGNOSIS — N390 Urinary tract infection, site not specified: Secondary | ICD-10-CM | POA: Diagnosis not present

## 2020-02-17 DIAGNOSIS — M4726 Other spondylosis with radiculopathy, lumbar region: Secondary | ICD-10-CM | POA: Diagnosis not present

## 2020-02-17 DIAGNOSIS — Z48812 Encounter for surgical aftercare following surgery on the circulatory system: Secondary | ICD-10-CM | POA: Diagnosis not present

## 2020-02-17 DIAGNOSIS — Z6838 Body mass index (BMI) 38.0-38.9, adult: Secondary | ICD-10-CM | POA: Diagnosis not present

## 2020-02-17 DIAGNOSIS — H905 Unspecified sensorineural hearing loss: Secondary | ICD-10-CM | POA: Diagnosis not present

## 2020-02-17 DIAGNOSIS — R112 Nausea with vomiting, unspecified: Secondary | ICD-10-CM | POA: Diagnosis not present

## 2020-02-17 DIAGNOSIS — Z1212 Encounter for screening for malignant neoplasm of rectum: Secondary | ICD-10-CM | POA: Diagnosis not present

## 2020-02-17 DIAGNOSIS — I493 Ventricular premature depolarization: Secondary | ICD-10-CM | POA: Diagnosis not present

## 2020-02-17 DIAGNOSIS — E669 Obesity, unspecified: Secondary | ICD-10-CM | POA: Diagnosis not present

## 2020-02-17 DIAGNOSIS — M4802 Spinal stenosis, cervical region: Secondary | ICD-10-CM | POA: Diagnosis not present

## 2020-02-17 DIAGNOSIS — K2270 Barrett's esophagus without dysplasia: Secondary | ICD-10-CM | POA: Diagnosis not present

## 2020-02-17 DIAGNOSIS — N39498 Other specified urinary incontinence: Secondary | ICD-10-CM | POA: Diagnosis not present

## 2020-02-17 DIAGNOSIS — H47011 Ischemic optic neuropathy, right eye: Secondary | ICD-10-CM | POA: Diagnosis not present

## 2020-02-17 DIAGNOSIS — E039 Hypothyroidism, unspecified: Secondary | ICD-10-CM | POA: Diagnosis not present

## 2020-02-17 DIAGNOSIS — M533 Sacrococcygeal disorders, not elsewhere classified: Secondary | ICD-10-CM | POA: Diagnosis not present

## 2020-02-17 DIAGNOSIS — I5032 Chronic diastolic (congestive) heart failure: Secondary | ICD-10-CM | POA: Diagnosis not present

## 2020-02-17 DIAGNOSIS — D72829 Elevated white blood cell count, unspecified: Secondary | ICD-10-CM | POA: Diagnosis not present

## 2020-02-17 DIAGNOSIS — D6489 Other specified anemias: Secondary | ICD-10-CM | POA: Diagnosis not present

## 2020-02-17 DIAGNOSIS — Z950 Presence of cardiac pacemaker: Secondary | ICD-10-CM | POA: Diagnosis not present

## 2020-02-17 DIAGNOSIS — M23331 Other meniscus derangements, other medial meniscus, right knee: Secondary | ICD-10-CM | POA: Diagnosis not present

## 2020-02-17 DIAGNOSIS — H47393 Other disorders of optic disc, bilateral: Secondary | ICD-10-CM | POA: Diagnosis not present

## 2020-02-17 DIAGNOSIS — H6241 Otitis externa in other diseases classified elsewhere, right ear: Secondary | ICD-10-CM | POA: Diagnosis not present

## 2020-02-17 DIAGNOSIS — Z954 Presence of other heart-valve replacement: Secondary | ICD-10-CM | POA: Diagnosis not present

## 2020-02-17 DIAGNOSIS — L03115 Cellulitis of right lower limb: Secondary | ICD-10-CM | POA: Diagnosis not present

## 2020-02-17 DIAGNOSIS — Z13220 Encounter for screening for lipoid disorders: Secondary | ICD-10-CM | POA: Diagnosis not present

## 2020-02-17 DIAGNOSIS — S52002D Unspecified fracture of upper end of left ulna, subsequent encounter for closed fracture with routine healing: Secondary | ICD-10-CM | POA: Diagnosis not present

## 2020-02-17 DIAGNOSIS — N958 Other specified menopausal and perimenopausal disorders: Secondary | ICD-10-CM | POA: Diagnosis not present

## 2020-02-17 DIAGNOSIS — N393 Stress incontinence (female) (male): Secondary | ICD-10-CM | POA: Diagnosis not present

## 2020-02-17 DIAGNOSIS — Z9002 Acquired absence of larynx: Secondary | ICD-10-CM | POA: Diagnosis not present

## 2020-02-17 DIAGNOSIS — Z9189 Other specified personal risk factors, not elsewhere classified: Secondary | ICD-10-CM | POA: Diagnosis not present

## 2020-02-17 DIAGNOSIS — H353211 Exudative age-related macular degeneration, right eye, with active choroidal neovascularization: Secondary | ICD-10-CM | POA: Diagnosis not present

## 2020-02-17 DIAGNOSIS — Z113 Encounter for screening for infections with a predominantly sexual mode of transmission: Secondary | ICD-10-CM | POA: Diagnosis not present

## 2020-02-17 DIAGNOSIS — Z9221 Personal history of antineoplastic chemotherapy: Secondary | ICD-10-CM | POA: Diagnosis not present

## 2020-02-17 DIAGNOSIS — N319 Neuromuscular dysfunction of bladder, unspecified: Secondary | ICD-10-CM | POA: Diagnosis not present

## 2020-02-17 DIAGNOSIS — R4701 Aphasia: Secondary | ICD-10-CM | POA: Diagnosis not present

## 2020-02-17 DIAGNOSIS — J9691 Respiratory failure, unspecified with hypoxia: Secondary | ICD-10-CM | POA: Diagnosis not present

## 2020-02-17 DIAGNOSIS — D72819 Decreased white blood cell count, unspecified: Secondary | ICD-10-CM | POA: Diagnosis not present

## 2020-02-17 DIAGNOSIS — Z1211 Encounter for screening for malignant neoplasm of colon: Secondary | ICD-10-CM | POA: Diagnosis not present

## 2020-02-17 DIAGNOSIS — M5386 Other specified dorsopathies, lumbar region: Secondary | ICD-10-CM | POA: Diagnosis not present

## 2020-02-17 DIAGNOSIS — N401 Enlarged prostate with lower urinary tract symptoms: Secondary | ICD-10-CM | POA: Diagnosis not present

## 2020-02-17 DIAGNOSIS — E211 Secondary hyperparathyroidism, not elsewhere classified: Secondary | ICD-10-CM | POA: Diagnosis not present

## 2020-02-17 DIAGNOSIS — S42201A Unspecified fracture of upper end of right humerus, initial encounter for closed fracture: Secondary | ICD-10-CM | POA: Diagnosis not present

## 2020-02-17 DIAGNOSIS — R209 Unspecified disturbances of skin sensation: Secondary | ICD-10-CM | POA: Diagnosis not present

## 2020-02-17 DIAGNOSIS — Z886 Allergy status to analgesic agent status: Secondary | ICD-10-CM | POA: Diagnosis not present

## 2020-02-17 DIAGNOSIS — N1330 Unspecified hydronephrosis: Secondary | ICD-10-CM | POA: Diagnosis not present

## 2020-02-17 DIAGNOSIS — Z803 Family history of malignant neoplasm of breast: Secondary | ICD-10-CM | POA: Diagnosis not present

## 2020-02-17 DIAGNOSIS — E890 Postprocedural hypothyroidism: Secondary | ICD-10-CM | POA: Diagnosis not present

## 2020-02-17 DIAGNOSIS — I421 Obstructive hypertrophic cardiomyopathy: Secondary | ICD-10-CM | POA: Diagnosis not present

## 2020-02-17 DIAGNOSIS — K5900 Constipation, unspecified: Secondary | ICD-10-CM | POA: Diagnosis not present

## 2020-02-17 DIAGNOSIS — M898X9 Other specified disorders of bone, unspecified site: Secondary | ICD-10-CM | POA: Diagnosis not present

## 2020-02-17 DIAGNOSIS — M1712 Unilateral primary osteoarthritis, left knee: Secondary | ICD-10-CM | POA: Diagnosis not present

## 2020-02-17 DIAGNOSIS — R1011 Right upper quadrant pain: Secondary | ICD-10-CM | POA: Diagnosis not present

## 2020-02-17 DIAGNOSIS — I7300 Raynaud's syndrome without gangrene: Secondary | ICD-10-CM | POA: Diagnosis not present

## 2020-02-17 DIAGNOSIS — K2100 Gastro-esophageal reflux disease with esophagitis, without bleeding: Secondary | ICD-10-CM | POA: Diagnosis not present

## 2020-02-17 DIAGNOSIS — R1310 Dysphagia, unspecified: Secondary | ICD-10-CM | POA: Diagnosis not present

## 2020-02-17 DIAGNOSIS — M1A00X0 Idiopathic chronic gout, unspecified site, without tophus (tophi): Secondary | ICD-10-CM | POA: Diagnosis not present

## 2020-02-17 DIAGNOSIS — W19XXXA Unspecified fall, initial encounter: Secondary | ICD-10-CM | POA: Diagnosis not present

## 2020-02-17 DIAGNOSIS — M62551 Muscle wasting and atrophy, not elsewhere classified, right thigh: Secondary | ICD-10-CM | POA: Diagnosis not present

## 2020-02-17 DIAGNOSIS — F5105 Insomnia due to other mental disorder: Secondary | ICD-10-CM | POA: Diagnosis not present

## 2020-02-17 DIAGNOSIS — M25571 Pain in right ankle and joints of right foot: Secondary | ICD-10-CM | POA: Diagnosis not present

## 2020-02-17 DIAGNOSIS — N952 Postmenopausal atrophic vaginitis: Secondary | ICD-10-CM | POA: Diagnosis not present

## 2020-02-17 DIAGNOSIS — Z8659 Personal history of other mental and behavioral disorders: Secondary | ICD-10-CM | POA: Diagnosis not present

## 2020-02-17 DIAGNOSIS — I272 Pulmonary hypertension, unspecified: Secondary | ICD-10-CM | POA: Diagnosis not present

## 2020-02-17 DIAGNOSIS — Z01419 Encounter for gynecological examination (general) (routine) without abnormal findings: Secondary | ICD-10-CM | POA: Diagnosis not present

## 2020-02-17 DIAGNOSIS — Z859 Personal history of malignant neoplasm, unspecified: Secondary | ICD-10-CM | POA: Diagnosis not present

## 2020-02-17 DIAGNOSIS — J440 Chronic obstructive pulmonary disease with acute lower respiratory infection: Secondary | ICD-10-CM | POA: Diagnosis not present

## 2020-02-17 DIAGNOSIS — H52203 Unspecified astigmatism, bilateral: Secondary | ICD-10-CM | POA: Diagnosis not present

## 2020-02-17 DIAGNOSIS — Z85038 Personal history of other malignant neoplasm of large intestine: Secondary | ICD-10-CM | POA: Diagnosis not present

## 2020-02-17 DIAGNOSIS — M549 Dorsalgia, unspecified: Secondary | ICD-10-CM | POA: Diagnosis not present

## 2020-02-17 DIAGNOSIS — I4892 Unspecified atrial flutter: Secondary | ICD-10-CM | POA: Diagnosis not present

## 2020-02-17 DIAGNOSIS — N909 Noninflammatory disorder of vulva and perineum, unspecified: Secondary | ICD-10-CM | POA: Diagnosis not present

## 2020-02-17 DIAGNOSIS — Z471 Aftercare following joint replacement surgery: Secondary | ICD-10-CM | POA: Diagnosis not present

## 2020-02-17 DIAGNOSIS — L814 Other melanin hyperpigmentation: Secondary | ICD-10-CM | POA: Diagnosis not present

## 2020-02-17 DIAGNOSIS — K922 Gastrointestinal hemorrhage, unspecified: Secondary | ICD-10-CM | POA: Diagnosis not present

## 2020-02-17 DIAGNOSIS — K254 Chronic or unspecified gastric ulcer with hemorrhage: Secondary | ICD-10-CM | POA: Diagnosis not present

## 2020-02-17 DIAGNOSIS — Z6841 Body Mass Index (BMI) 40.0 and over, adult: Secondary | ICD-10-CM | POA: Diagnosis not present

## 2020-02-17 DIAGNOSIS — R42 Dizziness and giddiness: Secondary | ICD-10-CM | POA: Diagnosis not present

## 2020-02-17 DIAGNOSIS — D6859 Other primary thrombophilia: Secondary | ICD-10-CM | POA: Diagnosis not present

## 2020-02-17 DIAGNOSIS — K660 Peritoneal adhesions (postprocedural) (postinfection): Secondary | ICD-10-CM | POA: Diagnosis not present

## 2020-02-17 DIAGNOSIS — H401112 Primary open-angle glaucoma, right eye, moderate stage: Secondary | ICD-10-CM | POA: Diagnosis not present

## 2020-02-17 DIAGNOSIS — M5030 Other cervical disc degeneration, unspecified cervical region: Secondary | ICD-10-CM | POA: Diagnosis not present

## 2020-02-17 DIAGNOSIS — N209 Urinary calculus, unspecified: Secondary | ICD-10-CM | POA: Diagnosis not present

## 2020-02-17 DIAGNOSIS — E271 Primary adrenocortical insufficiency: Secondary | ICD-10-CM | POA: Diagnosis not present

## 2020-02-17 DIAGNOSIS — I071 Rheumatic tricuspid insufficiency: Secondary | ICD-10-CM | POA: Diagnosis not present

## 2020-02-17 DIAGNOSIS — R262 Difficulty in walking, not elsewhere classified: Secondary | ICD-10-CM | POA: Diagnosis not present

## 2020-02-17 DIAGNOSIS — J431 Panlobular emphysema: Secondary | ICD-10-CM | POA: Diagnosis not present

## 2020-02-17 DIAGNOSIS — I6502 Occlusion and stenosis of left vertebral artery: Secondary | ICD-10-CM | POA: Diagnosis not present

## 2020-02-17 DIAGNOSIS — R590 Localized enlarged lymph nodes: Secondary | ICD-10-CM | POA: Diagnosis not present

## 2020-02-17 DIAGNOSIS — J45909 Unspecified asthma, uncomplicated: Secondary | ICD-10-CM | POA: Diagnosis not present

## 2020-02-17 DIAGNOSIS — S81802A Unspecified open wound, left lower leg, initial encounter: Secondary | ICD-10-CM | POA: Diagnosis not present

## 2020-02-17 DIAGNOSIS — M5442 Lumbago with sciatica, left side: Secondary | ICD-10-CM | POA: Diagnosis not present

## 2020-02-17 DIAGNOSIS — I69398 Other sequelae of cerebral infarction: Secondary | ICD-10-CM | POA: Diagnosis not present

## 2020-02-17 DIAGNOSIS — R270 Ataxia, unspecified: Secondary | ICD-10-CM | POA: Diagnosis not present

## 2020-02-17 DIAGNOSIS — G894 Chronic pain syndrome: Secondary | ICD-10-CM | POA: Diagnosis not present

## 2020-02-17 DIAGNOSIS — Z7952 Long term (current) use of systemic steroids: Secondary | ICD-10-CM | POA: Diagnosis not present

## 2020-02-17 DIAGNOSIS — E1140 Type 2 diabetes mellitus with diabetic neuropathy, unspecified: Secondary | ICD-10-CM | POA: Diagnosis not present

## 2020-02-17 DIAGNOSIS — R82998 Other abnormal findings in urine: Secondary | ICD-10-CM | POA: Diagnosis not present

## 2020-02-17 DIAGNOSIS — G8929 Other chronic pain: Secondary | ICD-10-CM | POA: Diagnosis not present

## 2020-02-17 DIAGNOSIS — E663 Overweight: Secondary | ICD-10-CM | POA: Diagnosis not present

## 2020-02-17 DIAGNOSIS — L509 Urticaria, unspecified: Secondary | ICD-10-CM | POA: Diagnosis not present

## 2020-02-17 DIAGNOSIS — G459 Transient cerebral ischemic attack, unspecified: Secondary | ICD-10-CM | POA: Diagnosis not present

## 2020-02-17 DIAGNOSIS — R944 Abnormal results of kidney function studies: Secondary | ICD-10-CM | POA: Diagnosis not present

## 2020-02-17 DIAGNOSIS — Z6836 Body mass index (BMI) 36.0-36.9, adult: Secondary | ICD-10-CM | POA: Diagnosis not present

## 2020-02-17 DIAGNOSIS — H40023 Open angle with borderline findings, high risk, bilateral: Secondary | ICD-10-CM | POA: Diagnosis not present

## 2020-02-17 DIAGNOSIS — R1115 Cyclical vomiting syndrome unrelated to migraine: Secondary | ICD-10-CM | POA: Diagnosis not present

## 2020-02-17 DIAGNOSIS — S86002D Unspecified injury of left Achilles tendon, subsequent encounter: Secondary | ICD-10-CM | POA: Diagnosis not present

## 2020-02-17 DIAGNOSIS — M5116 Intervertebral disc disorders with radiculopathy, lumbar region: Secondary | ICD-10-CM | POA: Diagnosis not present

## 2020-02-17 DIAGNOSIS — C44519 Basal cell carcinoma of skin of other part of trunk: Secondary | ICD-10-CM | POA: Diagnosis not present

## 2020-02-17 DIAGNOSIS — D696 Thrombocytopenia, unspecified: Secondary | ICD-10-CM | POA: Diagnosis not present

## 2020-02-17 DIAGNOSIS — Q87410 Marfan's syndrome with aortic dilation: Secondary | ICD-10-CM | POA: Diagnosis not present

## 2020-02-17 DIAGNOSIS — J1282 Pneumonia due to coronavirus disease 2019: Secondary | ICD-10-CM | POA: Diagnosis not present

## 2020-02-17 DIAGNOSIS — M25762 Osteophyte, left knee: Secondary | ICD-10-CM | POA: Diagnosis not present

## 2020-02-17 DIAGNOSIS — I350 Nonrheumatic aortic (valve) stenosis: Secondary | ICD-10-CM | POA: Diagnosis not present

## 2020-02-17 DIAGNOSIS — E782 Mixed hyperlipidemia: Secondary | ICD-10-CM | POA: Diagnosis not present

## 2020-02-17 DIAGNOSIS — R5383 Other fatigue: Secondary | ICD-10-CM | POA: Diagnosis not present

## 2020-02-17 DIAGNOSIS — G4700 Insomnia, unspecified: Secondary | ICD-10-CM | POA: Diagnosis not present

## 2020-02-17 DIAGNOSIS — M7061 Trochanteric bursitis, right hip: Secondary | ICD-10-CM | POA: Diagnosis not present

## 2020-02-17 DIAGNOSIS — R011 Cardiac murmur, unspecified: Secondary | ICD-10-CM | POA: Diagnosis not present

## 2020-02-17 DIAGNOSIS — S8991XA Unspecified injury of right lower leg, initial encounter: Secondary | ICD-10-CM | POA: Diagnosis not present

## 2020-02-17 DIAGNOSIS — R3915 Urgency of urination: Secondary | ICD-10-CM | POA: Diagnosis not present

## 2020-02-17 DIAGNOSIS — R251 Tremor, unspecified: Secondary | ICD-10-CM | POA: Diagnosis not present

## 2020-02-17 DIAGNOSIS — G918 Other hydrocephalus: Secondary | ICD-10-CM | POA: Diagnosis not present

## 2020-02-17 DIAGNOSIS — I509 Heart failure, unspecified: Secondary | ICD-10-CM | POA: Diagnosis not present

## 2020-02-17 DIAGNOSIS — R3129 Other microscopic hematuria: Secondary | ICD-10-CM | POA: Diagnosis not present

## 2020-02-17 DIAGNOSIS — M150 Primary generalized (osteo)arthritis: Secondary | ICD-10-CM | POA: Diagnosis not present

## 2020-02-17 DIAGNOSIS — E11621 Type 2 diabetes mellitus with foot ulcer: Secondary | ICD-10-CM | POA: Diagnosis not present

## 2020-02-17 DIAGNOSIS — M353 Polymyalgia rheumatica: Secondary | ICD-10-CM | POA: Diagnosis not present

## 2020-02-17 DIAGNOSIS — R39198 Other difficulties with micturition: Secondary | ICD-10-CM | POA: Diagnosis not present

## 2020-02-17 DIAGNOSIS — Z8719 Personal history of other diseases of the digestive system: Secondary | ICD-10-CM | POA: Diagnosis not present

## 2020-02-17 DIAGNOSIS — S12500A Unspecified displaced fracture of sixth cervical vertebra, initial encounter for closed fracture: Secondary | ICD-10-CM | POA: Diagnosis not present

## 2020-02-17 DIAGNOSIS — E60 Dietary zinc deficiency: Secondary | ICD-10-CM | POA: Diagnosis not present

## 2020-02-17 DIAGNOSIS — I051 Rheumatic mitral insufficiency: Secondary | ICD-10-CM | POA: Diagnosis not present

## 2020-02-17 DIAGNOSIS — R0602 Shortness of breath: Secondary | ICD-10-CM | POA: Diagnosis not present

## 2020-02-17 DIAGNOSIS — R591 Generalized enlarged lymph nodes: Secondary | ICD-10-CM | POA: Diagnosis not present

## 2020-02-17 DIAGNOSIS — M1990 Unspecified osteoarthritis, unspecified site: Secondary | ICD-10-CM | POA: Diagnosis not present

## 2020-02-17 DIAGNOSIS — F17200 Nicotine dependence, unspecified, uncomplicated: Secondary | ICD-10-CM | POA: Diagnosis not present

## 2020-02-17 DIAGNOSIS — H401114 Primary open-angle glaucoma, right eye, indeterminate stage: Secondary | ICD-10-CM | POA: Diagnosis not present

## 2020-02-17 DIAGNOSIS — H353132 Nonexudative age-related macular degeneration, bilateral, intermediate dry stage: Secondary | ICD-10-CM | POA: Diagnosis not present

## 2020-02-17 DIAGNOSIS — R8290 Unspecified abnormal findings in urine: Secondary | ICD-10-CM | POA: Diagnosis not present

## 2020-02-17 DIAGNOSIS — M79642 Pain in left hand: Secondary | ICD-10-CM | POA: Diagnosis not present

## 2020-02-17 DIAGNOSIS — C7989 Secondary malignant neoplasm of other specified sites: Secondary | ICD-10-CM | POA: Diagnosis not present

## 2020-02-17 DIAGNOSIS — R293 Abnormal posture: Secondary | ICD-10-CM | POA: Diagnosis not present

## 2020-02-17 DIAGNOSIS — M4727 Other spondylosis with radiculopathy, lumbosacral region: Secondary | ICD-10-CM | POA: Diagnosis not present

## 2020-02-17 DIAGNOSIS — Z01812 Encounter for preprocedural laboratory examination: Secondary | ICD-10-CM | POA: Diagnosis not present

## 2020-02-17 DIAGNOSIS — K529 Noninfective gastroenteritis and colitis, unspecified: Secondary | ICD-10-CM | POA: Diagnosis not present

## 2020-02-17 DIAGNOSIS — C679 Malignant neoplasm of bladder, unspecified: Secondary | ICD-10-CM | POA: Diagnosis not present

## 2020-02-17 DIAGNOSIS — H8110 Benign paroxysmal vertigo, unspecified ear: Secondary | ICD-10-CM | POA: Diagnosis not present

## 2020-02-17 DIAGNOSIS — M7541 Impingement syndrome of right shoulder: Secondary | ICD-10-CM | POA: Diagnosis not present

## 2020-02-17 DIAGNOSIS — I110 Hypertensive heart disease with heart failure: Secondary | ICD-10-CM | POA: Diagnosis not present

## 2020-02-17 DIAGNOSIS — H66001 Acute suppurative otitis media without spontaneous rupture of ear drum, right ear: Secondary | ICD-10-CM | POA: Diagnosis not present

## 2020-02-17 DIAGNOSIS — Z0001 Encounter for general adult medical examination with abnormal findings: Secondary | ICD-10-CM | POA: Diagnosis not present

## 2020-02-17 DIAGNOSIS — C9110 Chronic lymphocytic leukemia of B-cell type not having achieved remission: Secondary | ICD-10-CM | POA: Diagnosis not present

## 2020-02-17 DIAGNOSIS — F3341 Major depressive disorder, recurrent, in partial remission: Secondary | ICD-10-CM | POA: Diagnosis not present

## 2020-02-17 DIAGNOSIS — H25042 Posterior subcapsular polar age-related cataract, left eye: Secondary | ICD-10-CM | POA: Diagnosis not present

## 2020-02-17 DIAGNOSIS — C44612 Basal cell carcinoma of skin of right upper limb, including shoulder: Secondary | ICD-10-CM | POA: Diagnosis not present

## 2020-02-17 DIAGNOSIS — J329 Chronic sinusitis, unspecified: Secondary | ICD-10-CM | POA: Diagnosis not present

## 2020-02-17 DIAGNOSIS — M25641 Stiffness of right hand, not elsewhere classified: Secondary | ICD-10-CM | POA: Diagnosis not present

## 2020-02-17 DIAGNOSIS — E871 Hypo-osmolality and hyponatremia: Secondary | ICD-10-CM | POA: Diagnosis not present

## 2020-02-17 DIAGNOSIS — G720 Drug-induced myopathy: Secondary | ICD-10-CM | POA: Diagnosis not present

## 2020-02-17 DIAGNOSIS — D123 Benign neoplasm of transverse colon: Secondary | ICD-10-CM | POA: Diagnosis not present

## 2020-02-17 DIAGNOSIS — M0609 Rheumatoid arthritis without rheumatoid factor, multiple sites: Secondary | ICD-10-CM | POA: Diagnosis not present

## 2020-02-17 DIAGNOSIS — N760 Acute vaginitis: Secondary | ICD-10-CM | POA: Diagnosis not present

## 2020-02-17 DIAGNOSIS — C9310 Chronic myelomonocytic leukemia not having achieved remission: Secondary | ICD-10-CM | POA: Diagnosis not present

## 2020-02-17 DIAGNOSIS — H95193 Other disorders following mastoidectomy, bilateral ears: Secondary | ICD-10-CM | POA: Diagnosis not present

## 2020-02-17 DIAGNOSIS — R6889 Other general symptoms and signs: Secondary | ICD-10-CM | POA: Diagnosis not present

## 2020-02-17 DIAGNOSIS — M130 Polyarthritis, unspecified: Secondary | ICD-10-CM | POA: Diagnosis not present

## 2020-02-17 DIAGNOSIS — F322 Major depressive disorder, single episode, severe without psychotic features: Secondary | ICD-10-CM | POA: Diagnosis not present

## 2020-02-17 DIAGNOSIS — I714 Abdominal aortic aneurysm, without rupture: Secondary | ICD-10-CM | POA: Diagnosis not present

## 2020-02-17 DIAGNOSIS — Z6837 Body mass index (BMI) 37.0-37.9, adult: Secondary | ICD-10-CM | POA: Diagnosis not present

## 2020-02-17 DIAGNOSIS — Z4789 Encounter for other orthopedic aftercare: Secondary | ICD-10-CM | POA: Diagnosis not present

## 2020-02-17 DIAGNOSIS — R350 Frequency of micturition: Secondary | ICD-10-CM | POA: Diagnosis not present

## 2020-02-17 DIAGNOSIS — M9905 Segmental and somatic dysfunction of pelvic region: Secondary | ICD-10-CM | POA: Diagnosis not present

## 2020-02-17 DIAGNOSIS — F0281 Dementia in other diseases classified elsewhere with behavioral disturbance: Secondary | ICD-10-CM | POA: Diagnosis not present

## 2020-02-17 DIAGNOSIS — G8222 Paraplegia, incomplete: Secondary | ICD-10-CM | POA: Diagnosis not present

## 2020-02-17 DIAGNOSIS — M79672 Pain in left foot: Secondary | ICD-10-CM | POA: Diagnosis not present

## 2020-02-17 DIAGNOSIS — G20 Parkinson's disease: Secondary | ICD-10-CM | POA: Diagnosis not present

## 2020-02-17 DIAGNOSIS — I739 Peripheral vascular disease, unspecified: Secondary | ICD-10-CM | POA: Diagnosis not present

## 2020-02-17 DIAGNOSIS — I200 Unstable angina: Secondary | ICD-10-CM | POA: Diagnosis not present

## 2020-02-17 DIAGNOSIS — G479 Sleep disorder, unspecified: Secondary | ICD-10-CM | POA: Diagnosis not present

## 2020-02-17 DIAGNOSIS — F5101 Primary insomnia: Secondary | ICD-10-CM | POA: Diagnosis not present

## 2020-02-17 DIAGNOSIS — D630 Anemia in neoplastic disease: Secondary | ICD-10-CM | POA: Diagnosis not present

## 2020-02-17 DIAGNOSIS — H401122 Primary open-angle glaucoma, left eye, moderate stage: Secondary | ICD-10-CM | POA: Diagnosis not present

## 2020-02-17 DIAGNOSIS — N3281 Overactive bladder: Secondary | ICD-10-CM | POA: Diagnosis not present

## 2020-02-17 DIAGNOSIS — H524 Presbyopia: Secondary | ICD-10-CM | POA: Diagnosis not present

## 2020-02-17 DIAGNOSIS — H353131 Nonexudative age-related macular degeneration, bilateral, early dry stage: Secondary | ICD-10-CM | POA: Diagnosis not present

## 2020-02-17 DIAGNOSIS — F418 Other specified anxiety disorders: Secondary | ICD-10-CM | POA: Diagnosis not present

## 2020-02-17 DIAGNOSIS — H40013 Open angle with borderline findings, low risk, bilateral: Secondary | ICD-10-CM | POA: Diagnosis not present

## 2020-02-17 DIAGNOSIS — M25612 Stiffness of left shoulder, not elsewhere classified: Secondary | ICD-10-CM | POA: Diagnosis not present

## 2020-02-17 DIAGNOSIS — E440 Moderate protein-calorie malnutrition: Secondary | ICD-10-CM | POA: Diagnosis not present

## 2020-02-17 DIAGNOSIS — Z86018 Personal history of other benign neoplasm: Secondary | ICD-10-CM | POA: Diagnosis not present

## 2020-02-17 DIAGNOSIS — G8191 Hemiplegia, unspecified affecting right dominant side: Secondary | ICD-10-CM | POA: Diagnosis not present

## 2020-02-17 DIAGNOSIS — M222X2 Patellofemoral disorders, left knee: Secondary | ICD-10-CM | POA: Diagnosis not present

## 2020-02-17 DIAGNOSIS — G6189 Other inflammatory polyneuropathies: Secondary | ICD-10-CM | POA: Diagnosis not present

## 2020-02-17 DIAGNOSIS — I2583 Coronary atherosclerosis due to lipid rich plaque: Secondary | ICD-10-CM | POA: Diagnosis not present

## 2020-02-17 DIAGNOSIS — F1120 Opioid dependence, uncomplicated: Secondary | ICD-10-CM | POA: Diagnosis not present

## 2020-02-17 DIAGNOSIS — D649 Anemia, unspecified: Secondary | ICD-10-CM | POA: Diagnosis not present

## 2020-02-17 DIAGNOSIS — C031 Malignant neoplasm of lower gum: Secondary | ICD-10-CM | POA: Diagnosis not present

## 2020-02-17 DIAGNOSIS — R52 Pain, unspecified: Secondary | ICD-10-CM | POA: Diagnosis not present

## 2020-02-17 DIAGNOSIS — G2581 Restless legs syndrome: Secondary | ICD-10-CM | POA: Diagnosis not present

## 2020-02-17 DIAGNOSIS — E43 Unspecified severe protein-calorie malnutrition: Secondary | ICD-10-CM | POA: Diagnosis not present

## 2020-02-17 DIAGNOSIS — S79911A Unspecified injury of right hip, initial encounter: Secondary | ICD-10-CM | POA: Diagnosis not present

## 2020-02-17 DIAGNOSIS — Z08 Encounter for follow-up examination after completed treatment for malignant neoplasm: Secondary | ICD-10-CM | POA: Diagnosis not present

## 2020-02-17 DIAGNOSIS — M720 Palmar fascial fibromatosis [Dupuytren]: Secondary | ICD-10-CM | POA: Diagnosis not present

## 2020-02-17 DIAGNOSIS — M8000XD Age-related osteoporosis with current pathological fracture, unspecified site, subsequent encounter for fracture with routine healing: Secondary | ICD-10-CM | POA: Diagnosis not present

## 2020-02-17 DIAGNOSIS — I4510 Unspecified right bundle-branch block: Secondary | ICD-10-CM | POA: Diagnosis not present

## 2020-02-17 DIAGNOSIS — E1142 Type 2 diabetes mellitus with diabetic polyneuropathy: Secondary | ICD-10-CM | POA: Diagnosis not present

## 2020-02-17 DIAGNOSIS — R7309 Other abnormal glucose: Secondary | ICD-10-CM | POA: Diagnosis not present

## 2020-02-17 DIAGNOSIS — I214 Non-ST elevation (NSTEMI) myocardial infarction: Secondary | ICD-10-CM | POA: Diagnosis not present

## 2020-02-17 DIAGNOSIS — L409 Psoriasis, unspecified: Secondary | ICD-10-CM | POA: Diagnosis not present

## 2020-02-17 DIAGNOSIS — Z90710 Acquired absence of both cervix and uterus: Secondary | ICD-10-CM | POA: Diagnosis not present

## 2020-02-17 DIAGNOSIS — H6521 Chronic serous otitis media, right ear: Secondary | ICD-10-CM | POA: Diagnosis not present

## 2020-02-17 DIAGNOSIS — I499 Cardiac arrhythmia, unspecified: Secondary | ICD-10-CM | POA: Diagnosis not present

## 2020-02-17 DIAGNOSIS — C44629 Squamous cell carcinoma of skin of left upper limb, including shoulder: Secondary | ICD-10-CM | POA: Diagnosis not present

## 2020-02-17 DIAGNOSIS — M25512 Pain in left shoulder: Secondary | ICD-10-CM | POA: Diagnosis not present

## 2020-02-17 DIAGNOSIS — Z9989 Dependence on other enabling machines and devices: Secondary | ICD-10-CM | POA: Diagnosis not present

## 2020-02-17 DIAGNOSIS — R519 Headache, unspecified: Secondary | ICD-10-CM | POA: Diagnosis not present

## 2020-02-17 DIAGNOSIS — I119 Hypertensive heart disease without heart failure: Secondary | ICD-10-CM | POA: Diagnosis not present

## 2020-02-17 DIAGNOSIS — Z91018 Allergy to other foods: Secondary | ICD-10-CM | POA: Diagnosis not present

## 2020-02-17 DIAGNOSIS — C642 Malignant neoplasm of left kidney, except renal pelvis: Secondary | ICD-10-CM | POA: Diagnosis not present

## 2020-02-17 DIAGNOSIS — M9902 Segmental and somatic dysfunction of thoracic region: Secondary | ICD-10-CM | POA: Diagnosis not present

## 2020-02-17 DIAGNOSIS — Z9012 Acquired absence of left breast and nipple: Secondary | ICD-10-CM | POA: Diagnosis not present

## 2020-02-17 DIAGNOSIS — I5021 Acute systolic (congestive) heart failure: Secondary | ICD-10-CM | POA: Diagnosis not present

## 2020-02-17 DIAGNOSIS — E7800 Pure hypercholesterolemia, unspecified: Secondary | ICD-10-CM | POA: Diagnosis not present

## 2020-02-17 DIAGNOSIS — L02419 Cutaneous abscess of limb, unspecified: Secondary | ICD-10-CM | POA: Diagnosis not present

## 2020-02-17 DIAGNOSIS — C3431 Malignant neoplasm of lower lobe, right bronchus or lung: Secondary | ICD-10-CM | POA: Diagnosis not present

## 2020-02-17 DIAGNOSIS — E1165 Type 2 diabetes mellitus with hyperglycemia: Secondary | ICD-10-CM | POA: Diagnosis not present

## 2020-02-17 DIAGNOSIS — R599 Enlarged lymph nodes, unspecified: Secondary | ICD-10-CM | POA: Diagnosis not present

## 2020-02-17 DIAGNOSIS — M19012 Primary osteoarthritis, left shoulder: Secondary | ICD-10-CM | POA: Diagnosis not present

## 2020-02-17 DIAGNOSIS — M8588 Other specified disorders of bone density and structure, other site: Secondary | ICD-10-CM | POA: Diagnosis not present

## 2020-02-17 DIAGNOSIS — T148XXA Other injury of unspecified body region, initial encounter: Secondary | ICD-10-CM | POA: Diagnosis not present

## 2020-02-17 DIAGNOSIS — H2512 Age-related nuclear cataract, left eye: Secondary | ICD-10-CM | POA: Diagnosis not present

## 2020-02-17 DIAGNOSIS — I480 Paroxysmal atrial fibrillation: Secondary | ICD-10-CM | POA: Diagnosis not present

## 2020-02-17 DIAGNOSIS — Z86711 Personal history of pulmonary embolism: Secondary | ICD-10-CM | POA: Diagnosis not present

## 2020-02-17 DIAGNOSIS — M869 Osteomyelitis, unspecified: Secondary | ICD-10-CM | POA: Diagnosis not present

## 2020-02-17 DIAGNOSIS — Z8781 Personal history of (healed) traumatic fracture: Secondary | ICD-10-CM | POA: Diagnosis not present

## 2020-02-17 DIAGNOSIS — E789 Disorder of lipoprotein metabolism, unspecified: Secondary | ICD-10-CM | POA: Diagnosis not present

## 2020-02-17 DIAGNOSIS — F0280 Dementia in other diseases classified elsewhere without behavioral disturbance: Secondary | ICD-10-CM | POA: Diagnosis not present

## 2020-02-17 DIAGNOSIS — L03116 Cellulitis of left lower limb: Secondary | ICD-10-CM | POA: Diagnosis not present

## 2020-02-17 DIAGNOSIS — R34 Anuria and oliguria: Secondary | ICD-10-CM | POA: Diagnosis not present

## 2020-02-17 DIAGNOSIS — T8131XA Disruption of external operation (surgical) wound, not elsewhere classified, initial encounter: Secondary | ICD-10-CM | POA: Diagnosis not present

## 2020-02-17 DIAGNOSIS — H16223 Keratoconjunctivitis sicca, not specified as Sjogren's, bilateral: Secondary | ICD-10-CM | POA: Diagnosis not present

## 2020-02-17 DIAGNOSIS — C641 Malignant neoplasm of right kidney, except renal pelvis: Secondary | ICD-10-CM | POA: Diagnosis not present

## 2020-02-17 DIAGNOSIS — Z510 Encounter for antineoplastic radiation therapy: Secondary | ICD-10-CM | POA: Diagnosis not present

## 2020-02-17 DIAGNOSIS — I2510 Atherosclerotic heart disease of native coronary artery without angina pectoris: Secondary | ICD-10-CM | POA: Diagnosis not present

## 2020-02-17 DIAGNOSIS — R195 Other fecal abnormalities: Secondary | ICD-10-CM | POA: Diagnosis not present

## 2020-02-17 DIAGNOSIS — H9011 Conductive hearing loss, unilateral, right ear, with unrestricted hearing on the contralateral side: Secondary | ICD-10-CM | POA: Diagnosis not present

## 2020-02-17 DIAGNOSIS — Z8546 Personal history of malignant neoplasm of prostate: Secondary | ICD-10-CM | POA: Diagnosis not present

## 2020-02-17 DIAGNOSIS — E781 Pure hyperglyceridemia: Secondary | ICD-10-CM | POA: Diagnosis not present

## 2020-02-17 DIAGNOSIS — L97919 Non-pressure chronic ulcer of unspecified part of right lower leg with unspecified severity: Secondary | ICD-10-CM | POA: Diagnosis not present

## 2020-02-17 DIAGNOSIS — Z79899 Other long term (current) drug therapy: Secondary | ICD-10-CM | POA: Diagnosis not present

## 2020-02-17 DIAGNOSIS — R1313 Dysphagia, pharyngeal phase: Secondary | ICD-10-CM | POA: Diagnosis not present

## 2020-02-17 DIAGNOSIS — Z1231 Encounter for screening mammogram for malignant neoplasm of breast: Secondary | ICD-10-CM | POA: Diagnosis not present

## 2020-02-17 DIAGNOSIS — Y92018 Other place in single-family (private) house as the place of occurrence of the external cause: Secondary | ICD-10-CM | POA: Diagnosis not present

## 2020-02-17 DIAGNOSIS — G4733 Obstructive sleep apnea (adult) (pediatric): Secondary | ICD-10-CM | POA: Diagnosis not present

## 2020-02-17 DIAGNOSIS — Z872 Personal history of diseases of the skin and subcutaneous tissue: Secondary | ICD-10-CM | POA: Diagnosis not present

## 2020-02-17 DIAGNOSIS — J4521 Mild intermittent asthma with (acute) exacerbation: Secondary | ICD-10-CM | POA: Diagnosis not present

## 2020-02-17 DIAGNOSIS — M1711 Unilateral primary osteoarthritis, right knee: Secondary | ICD-10-CM | POA: Diagnosis not present

## 2020-02-17 DIAGNOSIS — N644 Mastodynia: Secondary | ICD-10-CM | POA: Diagnosis not present

## 2020-02-17 DIAGNOSIS — G40909 Epilepsy, unspecified, not intractable, without status epilepticus: Secondary | ICD-10-CM | POA: Diagnosis not present

## 2020-02-17 DIAGNOSIS — I5030 Unspecified diastolic (congestive) heart failure: Secondary | ICD-10-CM | POA: Diagnosis not present

## 2020-02-17 DIAGNOSIS — R413 Other amnesia: Secondary | ICD-10-CM | POA: Diagnosis not present

## 2020-02-17 DIAGNOSIS — I69311 Memory deficit following cerebral infarction: Secondary | ICD-10-CM | POA: Diagnosis not present

## 2020-02-17 DIAGNOSIS — N3090 Cystitis, unspecified without hematuria: Secondary | ICD-10-CM | POA: Diagnosis not present

## 2020-02-17 DIAGNOSIS — M9904 Segmental and somatic dysfunction of sacral region: Secondary | ICD-10-CM | POA: Diagnosis not present

## 2020-02-17 DIAGNOSIS — J3081 Allergic rhinitis due to animal (cat) (dog) hair and dander: Secondary | ICD-10-CM | POA: Diagnosis not present

## 2020-02-17 DIAGNOSIS — I6521 Occlusion and stenosis of right carotid artery: Secondary | ICD-10-CM | POA: Diagnosis not present

## 2020-02-17 DIAGNOSIS — N130 Hydronephrosis with ureteropelvic junction obstruction: Secondary | ICD-10-CM | POA: Diagnosis not present

## 2020-02-17 DIAGNOSIS — I429 Cardiomyopathy, unspecified: Secondary | ICD-10-CM | POA: Diagnosis not present

## 2020-02-17 DIAGNOSIS — S9032XA Contusion of left foot, initial encounter: Secondary | ICD-10-CM | POA: Diagnosis not present

## 2020-02-17 DIAGNOSIS — Z8701 Personal history of pneumonia (recurrent): Secondary | ICD-10-CM | POA: Diagnosis not present

## 2020-02-17 DIAGNOSIS — M47816 Spondylosis without myelopathy or radiculopathy, lumbar region: Secondary | ICD-10-CM | POA: Diagnosis not present

## 2020-02-17 DIAGNOSIS — M8580 Other specified disorders of bone density and structure, unspecified site: Secondary | ICD-10-CM | POA: Diagnosis not present

## 2020-02-17 DIAGNOSIS — R200 Anesthesia of skin: Secondary | ICD-10-CM | POA: Diagnosis not present

## 2020-02-17 DIAGNOSIS — D509 Iron deficiency anemia, unspecified: Secondary | ICD-10-CM | POA: Diagnosis not present

## 2020-02-17 DIAGNOSIS — M069 Rheumatoid arthritis, unspecified: Secondary | ICD-10-CM | POA: Diagnosis not present

## 2020-02-17 DIAGNOSIS — Z713 Dietary counseling and surveillance: Secondary | ICD-10-CM | POA: Diagnosis not present

## 2020-02-17 DIAGNOSIS — C44619 Basal cell carcinoma of skin of left upper limb, including shoulder: Secondary | ICD-10-CM | POA: Diagnosis not present

## 2020-02-17 DIAGNOSIS — Z1611 Resistance to penicillins: Secondary | ICD-10-CM | POA: Diagnosis not present

## 2020-02-17 DIAGNOSIS — Z91040 Latex allergy status: Secondary | ICD-10-CM | POA: Diagnosis not present

## 2020-02-17 DIAGNOSIS — F419 Anxiety disorder, unspecified: Secondary | ICD-10-CM | POA: Diagnosis not present

## 2020-02-17 DIAGNOSIS — R0789 Other chest pain: Secondary | ICD-10-CM | POA: Diagnosis not present

## 2020-02-17 DIAGNOSIS — Z1159 Encounter for screening for other viral diseases: Secondary | ICD-10-CM | POA: Diagnosis not present

## 2020-02-17 DIAGNOSIS — M25552 Pain in left hip: Secondary | ICD-10-CM | POA: Diagnosis not present

## 2020-02-17 DIAGNOSIS — E872 Acidosis: Secondary | ICD-10-CM | POA: Diagnosis not present

## 2020-02-17 DIAGNOSIS — M25562 Pain in left knee: Secondary | ICD-10-CM | POA: Diagnosis not present

## 2020-02-17 DIAGNOSIS — J301 Allergic rhinitis due to pollen: Secondary | ICD-10-CM | POA: Diagnosis not present

## 2020-02-17 DIAGNOSIS — E876 Hypokalemia: Secondary | ICD-10-CM | POA: Diagnosis not present

## 2020-02-17 DIAGNOSIS — H11003 Unspecified pterygium of eye, bilateral: Secondary | ICD-10-CM | POA: Diagnosis not present

## 2020-02-17 DIAGNOSIS — K635 Polyp of colon: Secondary | ICD-10-CM | POA: Diagnosis not present

## 2020-02-17 DIAGNOSIS — Z7689 Persons encountering health services in other specified circumstances: Secondary | ICD-10-CM | POA: Diagnosis not present

## 2020-02-17 DIAGNOSIS — F331 Major depressive disorder, recurrent, moderate: Secondary | ICD-10-CM | POA: Diagnosis not present

## 2020-02-17 DIAGNOSIS — M9903 Segmental and somatic dysfunction of lumbar region: Secondary | ICD-10-CM | POA: Diagnosis not present

## 2020-02-17 DIAGNOSIS — M25651 Stiffness of right hip, not elsewhere classified: Secondary | ICD-10-CM | POA: Diagnosis not present

## 2020-02-17 DIAGNOSIS — J188 Other pneumonia, unspecified organism: Secondary | ICD-10-CM | POA: Diagnosis not present

## 2020-02-17 DIAGNOSIS — Z85118 Personal history of other malignant neoplasm of bronchus and lung: Secondary | ICD-10-CM | POA: Diagnosis not present

## 2020-02-17 DIAGNOSIS — C44219 Basal cell carcinoma of skin of left ear and external auricular canal: Secondary | ICD-10-CM | POA: Diagnosis not present

## 2020-02-17 DIAGNOSIS — S41101D Unspecified open wound of right upper arm, subsequent encounter: Secondary | ICD-10-CM | POA: Diagnosis not present

## 2020-02-17 DIAGNOSIS — R531 Weakness: Secondary | ICD-10-CM | POA: Diagnosis not present

## 2020-02-17 DIAGNOSIS — A4101 Sepsis due to Methicillin susceptible Staphylococcus aureus: Secondary | ICD-10-CM | POA: Diagnosis not present

## 2020-02-17 DIAGNOSIS — E889 Metabolic disorder, unspecified: Secondary | ICD-10-CM | POA: Diagnosis not present

## 2020-02-17 DIAGNOSIS — D638 Anemia in other chronic diseases classified elsewhere: Secondary | ICD-10-CM | POA: Diagnosis not present

## 2020-02-17 DIAGNOSIS — Z6823 Body mass index (BMI) 23.0-23.9, adult: Secondary | ICD-10-CM | POA: Diagnosis not present

## 2020-02-17 DIAGNOSIS — M25561 Pain in right knee: Secondary | ICD-10-CM | POA: Diagnosis not present

## 2020-02-17 DIAGNOSIS — M459 Ankylosing spondylitis of unspecified sites in spine: Secondary | ICD-10-CM | POA: Diagnosis not present

## 2020-02-17 DIAGNOSIS — M62521 Muscle wasting and atrophy, not elsewhere classified, right upper arm: Secondary | ICD-10-CM | POA: Diagnosis not present

## 2020-02-17 DIAGNOSIS — D631 Anemia in chronic kidney disease: Secondary | ICD-10-CM | POA: Diagnosis not present

## 2020-02-17 DIAGNOSIS — N898 Other specified noninflammatory disorders of vagina: Secondary | ICD-10-CM | POA: Diagnosis not present

## 2020-02-17 DIAGNOSIS — M2550 Pain in unspecified joint: Secondary | ICD-10-CM | POA: Diagnosis not present

## 2020-02-17 DIAGNOSIS — S29019A Strain of muscle and tendon of unspecified wall of thorax, initial encounter: Secondary | ICD-10-CM | POA: Diagnosis not present

## 2020-02-17 DIAGNOSIS — I472 Ventricular tachycardia: Secondary | ICD-10-CM | POA: Diagnosis not present

## 2020-02-17 DIAGNOSIS — S42109D Fracture of unspecified part of scapula, unspecified shoulder, subsequent encounter for fracture with routine healing: Secondary | ICD-10-CM | POA: Diagnosis not present

## 2020-02-17 DIAGNOSIS — L570 Actinic keratosis: Secondary | ICD-10-CM | POA: Diagnosis not present

## 2020-02-17 DIAGNOSIS — M6282 Rhabdomyolysis: Secondary | ICD-10-CM | POA: Diagnosis not present

## 2020-02-17 DIAGNOSIS — N1832 Chronic kidney disease, stage 3b: Secondary | ICD-10-CM | POA: Diagnosis not present

## 2020-02-17 DIAGNOSIS — J9601 Acute respiratory failure with hypoxia: Secondary | ICD-10-CM | POA: Diagnosis not present

## 2020-02-17 DIAGNOSIS — R7303 Prediabetes: Secondary | ICD-10-CM | POA: Diagnosis not present

## 2020-02-17 DIAGNOSIS — F0391 Unspecified dementia with behavioral disturbance: Secondary | ICD-10-CM | POA: Diagnosis not present

## 2020-02-17 DIAGNOSIS — N200 Calculus of kidney: Secondary | ICD-10-CM | POA: Diagnosis not present

## 2020-02-17 DIAGNOSIS — Z1629 Resistance to other single specified antibiotic: Secondary | ICD-10-CM | POA: Diagnosis not present

## 2020-02-17 DIAGNOSIS — H47323 Drusen of optic disc, bilateral: Secondary | ICD-10-CM | POA: Diagnosis not present

## 2020-02-17 DIAGNOSIS — I130 Hypertensive heart and chronic kidney disease with heart failure and stage 1 through stage 4 chronic kidney disease, or unspecified chronic kidney disease: Secondary | ICD-10-CM | POA: Diagnosis not present

## 2020-02-17 DIAGNOSIS — I4891 Unspecified atrial fibrillation: Secondary | ICD-10-CM | POA: Diagnosis not present

## 2020-02-17 DIAGNOSIS — M75122 Complete rotator cuff tear or rupture of left shoulder, not specified as traumatic: Secondary | ICD-10-CM | POA: Diagnosis not present

## 2020-02-17 DIAGNOSIS — I872 Venous insufficiency (chronic) (peripheral): Secondary | ICD-10-CM | POA: Diagnosis not present

## 2020-02-17 DIAGNOSIS — E7849 Other hyperlipidemia: Secondary | ICD-10-CM | POA: Diagnosis not present

## 2020-02-17 DIAGNOSIS — S8992XA Unspecified injury of left lower leg, initial encounter: Secondary | ICD-10-CM | POA: Diagnosis not present

## 2020-02-17 DIAGNOSIS — E1149 Type 2 diabetes mellitus with other diabetic neurological complication: Secondary | ICD-10-CM | POA: Diagnosis not present

## 2020-02-17 DIAGNOSIS — M6281 Muscle weakness (generalized): Secondary | ICD-10-CM | POA: Diagnosis not present

## 2020-02-17 DIAGNOSIS — G43909 Migraine, unspecified, not intractable, without status migrainosus: Secondary | ICD-10-CM | POA: Diagnosis not present

## 2020-02-17 DIAGNOSIS — Z881 Allergy status to other antibiotic agents status: Secondary | ICD-10-CM | POA: Diagnosis not present

## 2020-02-17 DIAGNOSIS — G959 Disease of spinal cord, unspecified: Secondary | ICD-10-CM | POA: Diagnosis not present

## 2020-02-17 DIAGNOSIS — J9621 Acute and chronic respiratory failure with hypoxia: Secondary | ICD-10-CM | POA: Diagnosis not present

## 2020-02-17 DIAGNOSIS — L218 Other seborrheic dermatitis: Secondary | ICD-10-CM | POA: Diagnosis not present

## 2020-02-17 DIAGNOSIS — M25642 Stiffness of left hand, not elsewhere classified: Secondary | ICD-10-CM | POA: Diagnosis not present

## 2020-02-17 DIAGNOSIS — Z8619 Personal history of other infectious and parasitic diseases: Secondary | ICD-10-CM | POA: Diagnosis not present

## 2020-02-17 DIAGNOSIS — F1010 Alcohol abuse, uncomplicated: Secondary | ICD-10-CM | POA: Diagnosis not present

## 2020-02-17 DIAGNOSIS — I517 Cardiomegaly: Secondary | ICD-10-CM | POA: Diagnosis not present

## 2020-02-17 DIAGNOSIS — E1100 Type 2 diabetes mellitus with hyperosmolarity without nonketotic hyperglycemic-hyperosmolar coma (NKHHC): Secondary | ICD-10-CM | POA: Diagnosis not present

## 2020-02-17 DIAGNOSIS — I440 Atrioventricular block, first degree: Secondary | ICD-10-CM | POA: Diagnosis not present

## 2020-02-17 DIAGNOSIS — Z1283 Encounter for screening for malignant neoplasm of skin: Secondary | ICD-10-CM | POA: Diagnosis not present

## 2020-02-17 DIAGNOSIS — S299XXA Unspecified injury of thorax, initial encounter: Secondary | ICD-10-CM | POA: Diagnosis not present

## 2020-02-17 DIAGNOSIS — I158 Other secondary hypertension: Secondary | ICD-10-CM | POA: Diagnosis not present

## 2020-02-17 DIAGNOSIS — I469 Cardiac arrest, cause unspecified: Secondary | ICD-10-CM | POA: Diagnosis not present

## 2020-02-17 DIAGNOSIS — S7292XD Unspecified fracture of left femur, subsequent encounter for closed fracture with routine healing: Secondary | ICD-10-CM | POA: Diagnosis not present

## 2020-02-17 DIAGNOSIS — R823 Hemoglobinuria: Secondary | ICD-10-CM | POA: Diagnosis not present

## 2020-02-17 DIAGNOSIS — R1010 Upper abdominal pain, unspecified: Secondary | ICD-10-CM | POA: Diagnosis not present

## 2020-02-17 DIAGNOSIS — H52223 Regular astigmatism, bilateral: Secondary | ICD-10-CM | POA: Diagnosis not present

## 2020-02-17 DIAGNOSIS — H02831 Dermatochalasis of right upper eyelid: Secondary | ICD-10-CM | POA: Diagnosis not present

## 2020-02-17 DIAGNOSIS — I63541 Cerebral infarction due to unspecified occlusion or stenosis of right cerebellar artery: Secondary | ICD-10-CM | POA: Diagnosis not present

## 2020-02-17 DIAGNOSIS — I82402 Acute embolism and thrombosis of unspecified deep veins of left lower extremity: Secondary | ICD-10-CM | POA: Diagnosis not present

## 2020-02-17 DIAGNOSIS — H40053 Ocular hypertension, bilateral: Secondary | ICD-10-CM | POA: Diagnosis not present

## 2020-02-17 DIAGNOSIS — H25013 Cortical age-related cataract, bilateral: Secondary | ICD-10-CM | POA: Diagnosis not present

## 2020-02-17 DIAGNOSIS — E539 Vitamin B deficiency, unspecified: Secondary | ICD-10-CM | POA: Diagnosis not present

## 2020-02-17 DIAGNOSIS — M542 Cervicalgia: Secondary | ICD-10-CM | POA: Diagnosis not present

## 2020-02-17 DIAGNOSIS — R278 Other lack of coordination: Secondary | ICD-10-CM | POA: Diagnosis not present

## 2020-02-17 DIAGNOSIS — I442 Atrioventricular block, complete: Secondary | ICD-10-CM | POA: Diagnosis not present

## 2020-02-17 DIAGNOSIS — M76821 Posterior tibial tendinitis, right leg: Secondary | ICD-10-CM | POA: Diagnosis not present

## 2020-02-17 DIAGNOSIS — Z808 Family history of malignant neoplasm of other organs or systems: Secondary | ICD-10-CM | POA: Diagnosis not present

## 2020-02-17 DIAGNOSIS — Z01818 Encounter for other preprocedural examination: Secondary | ICD-10-CM | POA: Diagnosis not present

## 2020-02-17 DIAGNOSIS — Z6828 Body mass index (BMI) 28.0-28.9, adult: Secondary | ICD-10-CM | POA: Diagnosis not present

## 2020-02-17 DIAGNOSIS — Z5111 Encounter for antineoplastic chemotherapy: Secondary | ICD-10-CM | POA: Diagnosis not present

## 2020-02-17 DIAGNOSIS — R739 Hyperglycemia, unspecified: Secondary | ICD-10-CM | POA: Diagnosis not present

## 2020-02-17 DIAGNOSIS — R1084 Generalized abdominal pain: Secondary | ICD-10-CM | POA: Diagnosis not present

## 2020-02-17 DIAGNOSIS — H35373 Puckering of macula, bilateral: Secondary | ICD-10-CM | POA: Diagnosis not present

## 2020-02-17 DIAGNOSIS — I69391 Dysphagia following cerebral infarction: Secondary | ICD-10-CM | POA: Diagnosis not present

## 2020-02-17 DIAGNOSIS — M79675 Pain in left toe(s): Secondary | ICD-10-CM | POA: Diagnosis not present

## 2020-02-17 DIAGNOSIS — Z89432 Acquired absence of left foot: Secondary | ICD-10-CM | POA: Diagnosis not present

## 2020-02-17 DIAGNOSIS — N950 Postmenopausal bleeding: Secondary | ICD-10-CM | POA: Diagnosis not present

## 2020-02-17 DIAGNOSIS — H0288B Meibomian gland dysfunction left eye, upper and lower eyelids: Secondary | ICD-10-CM | POA: Diagnosis not present

## 2020-02-17 DIAGNOSIS — Z7983 Long term (current) use of bisphosphonates: Secondary | ICD-10-CM | POA: Diagnosis not present

## 2020-02-17 DIAGNOSIS — M5031 Other cervical disc degeneration,  high cervical region: Secondary | ICD-10-CM | POA: Diagnosis not present

## 2020-02-17 DIAGNOSIS — Z1331 Encounter for screening for depression: Secondary | ICD-10-CM | POA: Diagnosis not present

## 2020-02-17 DIAGNOSIS — J4520 Mild intermittent asthma, uncomplicated: Secondary | ICD-10-CM | POA: Diagnosis not present

## 2020-02-17 DIAGNOSIS — H409 Unspecified glaucoma: Secondary | ICD-10-CM | POA: Diagnosis not present

## 2020-02-17 DIAGNOSIS — X32XXXD Exposure to sunlight, subsequent encounter: Secondary | ICD-10-CM | POA: Diagnosis not present

## 2020-02-17 DIAGNOSIS — R000 Tachycardia, unspecified: Secondary | ICD-10-CM | POA: Diagnosis not present

## 2020-02-17 DIAGNOSIS — I6523 Occlusion and stenosis of bilateral carotid arteries: Secondary | ICD-10-CM | POA: Diagnosis not present

## 2020-02-17 DIAGNOSIS — S3991XA Unspecified injury of abdomen, initial encounter: Secondary | ICD-10-CM | POA: Diagnosis not present

## 2020-02-17 DIAGNOSIS — R9431 Abnormal electrocardiogram [ECG] [EKG]: Secondary | ICD-10-CM | POA: Diagnosis not present

## 2020-02-17 DIAGNOSIS — R109 Unspecified abdominal pain: Secondary | ICD-10-CM | POA: Diagnosis not present

## 2020-02-17 DIAGNOSIS — Z820 Family history of epilepsy and other diseases of the nervous system: Secondary | ICD-10-CM | POA: Diagnosis not present

## 2020-02-17 DIAGNOSIS — Z969 Presence of functional implant, unspecified: Secondary | ICD-10-CM | POA: Diagnosis not present

## 2020-02-17 DIAGNOSIS — G5701 Lesion of sciatic nerve, right lower limb: Secondary | ICD-10-CM | POA: Diagnosis not present

## 2020-02-17 DIAGNOSIS — C775 Secondary and unspecified malignant neoplasm of intrapelvic lymph nodes: Secondary | ICD-10-CM | POA: Diagnosis not present

## 2020-02-17 DIAGNOSIS — J479 Bronchiectasis, uncomplicated: Secondary | ICD-10-CM | POA: Diagnosis not present

## 2020-02-17 DIAGNOSIS — Z125 Encounter for screening for malignant neoplasm of prostate: Secondary | ICD-10-CM | POA: Diagnosis not present

## 2020-02-17 DIAGNOSIS — H40033 Anatomical narrow angle, bilateral: Secondary | ICD-10-CM | POA: Diagnosis not present

## 2020-02-17 DIAGNOSIS — I83813 Varicose veins of bilateral lower extremities with pain: Secondary | ICD-10-CM | POA: Diagnosis not present

## 2020-02-17 DIAGNOSIS — E854 Organ-limited amyloidosis: Secondary | ICD-10-CM | POA: Diagnosis not present

## 2020-02-17 DIAGNOSIS — R0781 Pleurodynia: Secondary | ICD-10-CM | POA: Diagnosis not present

## 2020-02-17 DIAGNOSIS — K222 Esophageal obstruction: Secondary | ICD-10-CM | POA: Diagnosis not present

## 2020-02-17 DIAGNOSIS — C7931 Secondary malignant neoplasm of brain: Secondary | ICD-10-CM | POA: Diagnosis not present

## 2020-02-17 DIAGNOSIS — G609 Hereditary and idiopathic neuropathy, unspecified: Secondary | ICD-10-CM | POA: Diagnosis not present

## 2020-02-17 DIAGNOSIS — S29012A Strain of muscle and tendon of back wall of thorax, initial encounter: Secondary | ICD-10-CM | POA: Diagnosis not present

## 2020-02-17 DIAGNOSIS — L98411 Non-pressure chronic ulcer of buttock limited to breakdown of skin: Secondary | ICD-10-CM | POA: Diagnosis not present

## 2020-02-17 DIAGNOSIS — I8312 Varicose veins of left lower extremity with inflammation: Secondary | ICD-10-CM | POA: Diagnosis not present

## 2020-02-17 DIAGNOSIS — N8111 Cystocele, midline: Secondary | ICD-10-CM | POA: Diagnosis not present

## 2020-02-17 DIAGNOSIS — Z96612 Presence of left artificial shoulder joint: Secondary | ICD-10-CM | POA: Diagnosis not present

## 2020-02-17 DIAGNOSIS — Z880 Allergy status to penicillin: Secondary | ICD-10-CM | POA: Diagnosis not present

## 2020-02-17 DIAGNOSIS — Z89512 Acquired absence of left leg below knee: Secondary | ICD-10-CM | POA: Diagnosis not present

## 2020-02-17 DIAGNOSIS — C61 Malignant neoplasm of prostate: Secondary | ICD-10-CM | POA: Diagnosis not present

## 2020-02-17 DIAGNOSIS — R6884 Jaw pain: Secondary | ICD-10-CM | POA: Diagnosis not present

## 2020-02-17 DIAGNOSIS — H43811 Vitreous degeneration, right eye: Secondary | ICD-10-CM | POA: Diagnosis not present

## 2020-02-17 DIAGNOSIS — H2513 Age-related nuclear cataract, bilateral: Secondary | ICD-10-CM | POA: Diagnosis not present

## 2020-02-17 DIAGNOSIS — E559 Vitamin D deficiency, unspecified: Secondary | ICD-10-CM | POA: Diagnosis not present

## 2020-02-17 DIAGNOSIS — E11610 Type 2 diabetes mellitus with diabetic neuropathic arthropathy: Secondary | ICD-10-CM | POA: Diagnosis not present

## 2020-02-17 DIAGNOSIS — E109 Type 1 diabetes mellitus without complications: Secondary | ICD-10-CM | POA: Diagnosis not present

## 2020-02-17 DIAGNOSIS — T8189XA Other complications of procedures, not elsewhere classified, initial encounter: Secondary | ICD-10-CM | POA: Diagnosis not present

## 2020-02-17 DIAGNOSIS — L853 Xerosis cutis: Secondary | ICD-10-CM | POA: Diagnosis not present

## 2020-02-17 DIAGNOSIS — I495 Sick sinus syndrome: Secondary | ICD-10-CM | POA: Diagnosis not present

## 2020-02-17 DIAGNOSIS — I255 Ischemic cardiomyopathy: Secondary | ICD-10-CM | POA: Diagnosis not present

## 2020-02-17 DIAGNOSIS — Z792 Long term (current) use of antibiotics: Secondary | ICD-10-CM | POA: Diagnosis not present

## 2020-02-17 DIAGNOSIS — E042 Nontoxic multinodular goiter: Secondary | ICD-10-CM | POA: Diagnosis not present

## 2020-02-17 DIAGNOSIS — L4059 Other psoriatic arthropathy: Secondary | ICD-10-CM | POA: Diagnosis not present

## 2020-02-17 DIAGNOSIS — F0150 Vascular dementia without behavioral disturbance: Secondary | ICD-10-CM | POA: Diagnosis not present

## 2020-02-17 DIAGNOSIS — I716 Thoracoabdominal aortic aneurysm, without rupture: Secondary | ICD-10-CM | POA: Diagnosis not present

## 2020-02-17 DIAGNOSIS — R079 Chest pain, unspecified: Secondary | ICD-10-CM | POA: Diagnosis not present

## 2020-02-17 DIAGNOSIS — M47892 Other spondylosis, cervical region: Secondary | ICD-10-CM | POA: Diagnosis not present

## 2020-02-17 DIAGNOSIS — Z7902 Long term (current) use of antithrombotics/antiplatelets: Secondary | ICD-10-CM | POA: Diagnosis not present

## 2020-02-17 DIAGNOSIS — C189 Malignant neoplasm of colon, unspecified: Secondary | ICD-10-CM | POA: Diagnosis not present

## 2020-02-17 DIAGNOSIS — M8589 Other specified disorders of bone density and structure, multiple sites: Secondary | ICD-10-CM | POA: Diagnosis not present

## 2020-02-17 DIAGNOSIS — C4442 Squamous cell carcinoma of skin of scalp and neck: Secondary | ICD-10-CM | POA: Diagnosis not present

## 2020-02-17 DIAGNOSIS — L659 Nonscarring hair loss, unspecified: Secondary | ICD-10-CM | POA: Diagnosis not present

## 2020-02-17 DIAGNOSIS — J90 Pleural effusion, not elsewhere classified: Secondary | ICD-10-CM | POA: Diagnosis not present

## 2020-02-17 DIAGNOSIS — Z4801 Encounter for change or removal of surgical wound dressing: Secondary | ICD-10-CM | POA: Diagnosis not present

## 2020-02-17 DIAGNOSIS — N312 Flaccid neuropathic bladder, not elsewhere classified: Secondary | ICD-10-CM | POA: Diagnosis not present

## 2020-02-17 DIAGNOSIS — S83231A Complex tear of medial meniscus, current injury, right knee, initial encounter: Secondary | ICD-10-CM | POA: Diagnosis not present

## 2020-02-17 DIAGNOSIS — Z139 Encounter for screening, unspecified: Secondary | ICD-10-CM | POA: Diagnosis not present

## 2020-02-17 DIAGNOSIS — M7062 Trochanteric bursitis, left hip: Secondary | ICD-10-CM | POA: Diagnosis not present

## 2020-02-17 DIAGNOSIS — H02834 Dermatochalasis of left upper eyelid: Secondary | ICD-10-CM | POA: Diagnosis not present

## 2020-02-17 DIAGNOSIS — L985 Mucinosis of the skin: Secondary | ICD-10-CM | POA: Diagnosis not present

## 2020-02-17 DIAGNOSIS — Z933 Colostomy status: Secondary | ICD-10-CM | POA: Diagnosis not present

## 2020-02-17 DIAGNOSIS — I890 Lymphedema, not elsewhere classified: Secondary | ICD-10-CM | POA: Diagnosis not present

## 2020-02-17 DIAGNOSIS — F429 Obsessive-compulsive disorder, unspecified: Secondary | ICD-10-CM | POA: Diagnosis not present

## 2020-02-17 DIAGNOSIS — I5082 Biventricular heart failure: Secondary | ICD-10-CM | POA: Diagnosis not present

## 2020-02-17 DIAGNOSIS — Z794 Long term (current) use of insulin: Secondary | ICD-10-CM | POA: Diagnosis not present

## 2020-02-17 DIAGNOSIS — C50912 Malignant neoplasm of unspecified site of left female breast: Secondary | ICD-10-CM | POA: Diagnosis not present

## 2020-02-17 DIAGNOSIS — M159 Polyosteoarthritis, unspecified: Secondary | ICD-10-CM | POA: Diagnosis not present

## 2020-02-17 DIAGNOSIS — M316 Other giant cell arteritis: Secondary | ICD-10-CM | POA: Diagnosis not present

## 2020-02-17 DIAGNOSIS — L97909 Non-pressure chronic ulcer of unspecified part of unspecified lower leg with unspecified severity: Secondary | ICD-10-CM | POA: Diagnosis not present

## 2020-02-17 DIAGNOSIS — D519 Vitamin B12 deficiency anemia, unspecified: Secondary | ICD-10-CM | POA: Diagnosis not present

## 2020-02-17 DIAGNOSIS — R040 Epistaxis: Secondary | ICD-10-CM | POA: Diagnosis not present

## 2020-02-17 DIAGNOSIS — Z79890 Hormone replacement therapy (postmenopausal): Secondary | ICD-10-CM | POA: Diagnosis not present

## 2020-02-17 DIAGNOSIS — J984 Other disorders of lung: Secondary | ICD-10-CM | POA: Diagnosis not present

## 2020-02-17 DIAGNOSIS — C4321 Malignant melanoma of right ear and external auricular canal: Secondary | ICD-10-CM | POA: Diagnosis not present

## 2020-02-17 DIAGNOSIS — N3010 Interstitial cystitis (chronic) without hematuria: Secondary | ICD-10-CM | POA: Diagnosis not present

## 2020-02-17 DIAGNOSIS — L308 Other specified dermatitis: Secondary | ICD-10-CM | POA: Diagnosis not present

## 2020-02-17 DIAGNOSIS — M5134 Other intervertebral disc degeneration, thoracic region: Secondary | ICD-10-CM | POA: Diagnosis not present

## 2020-02-17 DIAGNOSIS — S52501D Unspecified fracture of the lower end of right radius, subsequent encounter for closed fracture with routine healing: Secondary | ICD-10-CM | POA: Diagnosis not present

## 2020-02-17 DIAGNOSIS — M5417 Radiculopathy, lumbosacral region: Secondary | ICD-10-CM | POA: Diagnosis not present

## 2020-02-17 DIAGNOSIS — D1801 Hemangioma of skin and subcutaneous tissue: Secondary | ICD-10-CM | POA: Diagnosis not present

## 2020-02-17 DIAGNOSIS — K431 Incisional hernia with gangrene: Secondary | ICD-10-CM | POA: Diagnosis not present

## 2020-02-17 DIAGNOSIS — R399 Unspecified symptoms and signs involving the genitourinary system: Secondary | ICD-10-CM | POA: Diagnosis not present

## 2020-02-17 DIAGNOSIS — Z1339 Encounter for screening examination for other mental health and behavioral disorders: Secondary | ICD-10-CM | POA: Diagnosis not present

## 2020-02-17 DIAGNOSIS — R05 Cough: Secondary | ICD-10-CM | POA: Diagnosis not present

## 2020-02-17 DIAGNOSIS — G252 Other specified forms of tremor: Secondary | ICD-10-CM | POA: Diagnosis not present

## 2020-02-17 DIAGNOSIS — E291 Testicular hypofunction: Secondary | ICD-10-CM | POA: Diagnosis not present

## 2020-02-17 DIAGNOSIS — I69392 Facial weakness following cerebral infarction: Secondary | ICD-10-CM | POA: Diagnosis not present

## 2020-02-17 DIAGNOSIS — Z780 Asymptomatic menopausal state: Secondary | ICD-10-CM | POA: Diagnosis not present

## 2020-02-17 DIAGNOSIS — L89621 Pressure ulcer of left heel, stage 1: Secondary | ICD-10-CM | POA: Diagnosis not present

## 2020-02-17 DIAGNOSIS — B379 Candidiasis, unspecified: Secondary | ICD-10-CM | POA: Diagnosis not present

## 2020-02-17 DIAGNOSIS — M1710 Unilateral primary osteoarthritis, unspecified knee: Secondary | ICD-10-CM | POA: Diagnosis not present

## 2020-02-17 DIAGNOSIS — H1045 Other chronic allergic conjunctivitis: Secondary | ICD-10-CM | POA: Diagnosis not present

## 2020-02-17 DIAGNOSIS — Z961 Presence of intraocular lens: Secondary | ICD-10-CM | POA: Diagnosis not present

## 2020-02-17 DIAGNOSIS — E785 Hyperlipidemia, unspecified: Secondary | ICD-10-CM | POA: Diagnosis not present

## 2020-02-17 DIAGNOSIS — C250 Malignant neoplasm of head of pancreas: Secondary | ICD-10-CM | POA: Diagnosis not present

## 2020-02-17 DIAGNOSIS — Z940 Kidney transplant status: Secondary | ICD-10-CM | POA: Diagnosis not present

## 2020-02-17 DIAGNOSIS — I69322 Dysarthria following cerebral infarction: Secondary | ICD-10-CM | POA: Diagnosis not present

## 2020-02-17 DIAGNOSIS — L97921 Non-pressure chronic ulcer of unspecified part of left lower leg limited to breakdown of skin: Secondary | ICD-10-CM | POA: Diagnosis not present

## 2020-02-17 DIAGNOSIS — M7121 Synovial cyst of popliteal space [Baker], right knee: Secondary | ICD-10-CM | POA: Diagnosis not present

## 2020-02-17 DIAGNOSIS — M79671 Pain in right foot: Secondary | ICD-10-CM | POA: Diagnosis not present

## 2020-02-17 DIAGNOSIS — D539 Nutritional anemia, unspecified: Secondary | ICD-10-CM | POA: Diagnosis not present

## 2020-02-17 DIAGNOSIS — M8008XD Age-related osteoporosis with current pathological fracture, vertebra(e), subsequent encounter for fracture with routine healing: Secondary | ICD-10-CM | POA: Diagnosis not present

## 2020-02-17 DIAGNOSIS — R7989 Other specified abnormal findings of blood chemistry: Secondary | ICD-10-CM | POA: Diagnosis not present

## 2020-02-17 DIAGNOSIS — D472 Monoclonal gammopathy: Secondary | ICD-10-CM | POA: Diagnosis not present

## 2020-02-17 DIAGNOSIS — Z4542 Encounter for adjustment and management of neuropacemaker (brain) (peripheral nerve) (spinal cord): Secondary | ICD-10-CM | POA: Diagnosis not present

## 2020-02-17 DIAGNOSIS — Z4781 Encounter for orthopedic aftercare following surgical amputation: Secondary | ICD-10-CM | POA: Diagnosis not present

## 2020-02-17 DIAGNOSIS — K7031 Alcoholic cirrhosis of liver with ascites: Secondary | ICD-10-CM | POA: Diagnosis not present

## 2020-02-17 DIAGNOSIS — C7951 Secondary malignant neoplasm of bone: Secondary | ICD-10-CM | POA: Diagnosis not present

## 2020-02-17 DIAGNOSIS — E11319 Type 2 diabetes mellitus with unspecified diabetic retinopathy without macular edema: Secondary | ICD-10-CM | POA: Diagnosis not present

## 2020-02-17 DIAGNOSIS — Z1589 Genetic susceptibility to other disease: Secondary | ICD-10-CM | POA: Diagnosis not present

## 2020-02-17 DIAGNOSIS — I120 Hypertensive chronic kidney disease with stage 5 chronic kidney disease or end stage renal disease: Secondary | ICD-10-CM | POA: Diagnosis not present

## 2020-02-17 DIAGNOSIS — T85520A Displacement of bile duct prosthesis, initial encounter: Secondary | ICD-10-CM | POA: Diagnosis not present

## 2020-02-17 DIAGNOSIS — I680 Cerebral amyloid angiopathy: Secondary | ICD-10-CM | POA: Diagnosis not present

## 2020-02-17 DIAGNOSIS — S52571D Other intraarticular fracture of lower end of right radius, subsequent encounter for closed fracture with routine healing: Secondary | ICD-10-CM | POA: Diagnosis not present

## 2020-02-17 DIAGNOSIS — E041 Nontoxic single thyroid nodule: Secondary | ICD-10-CM | POA: Diagnosis not present

## 2020-02-17 DIAGNOSIS — M25462 Effusion, left knee: Secondary | ICD-10-CM | POA: Diagnosis not present

## 2020-02-17 DIAGNOSIS — I129 Hypertensive chronic kidney disease with stage 1 through stage 4 chronic kidney disease, or unspecified chronic kidney disease: Secondary | ICD-10-CM | POA: Diagnosis not present

## 2020-02-17 DIAGNOSIS — M25572 Pain in left ankle and joints of left foot: Secondary | ICD-10-CM | POA: Diagnosis not present

## 2020-02-17 DIAGNOSIS — Z20822 Contact with and (suspected) exposure to covid-19: Secondary | ICD-10-CM | POA: Diagnosis not present

## 2020-02-17 DIAGNOSIS — D500 Iron deficiency anemia secondary to blood loss (chronic): Secondary | ICD-10-CM | POA: Diagnosis not present

## 2020-02-17 DIAGNOSIS — M47812 Spondylosis without myelopathy or radiculopathy, cervical region: Secondary | ICD-10-CM | POA: Diagnosis not present

## 2020-02-17 DIAGNOSIS — N3941 Urge incontinence: Secondary | ICD-10-CM | POA: Diagnosis not present

## 2020-02-17 DIAGNOSIS — I69320 Aphasia following cerebral infarction: Secondary | ICD-10-CM | POA: Diagnosis not present

## 2020-02-17 DIAGNOSIS — H35363 Drusen (degenerative) of macula, bilateral: Secondary | ICD-10-CM | POA: Diagnosis not present

## 2020-02-17 DIAGNOSIS — W010XXA Fall on same level from slipping, tripping and stumbling without subsequent striking against object, initial encounter: Secondary | ICD-10-CM | POA: Diagnosis not present

## 2020-02-17 DIAGNOSIS — H16141 Punctate keratitis, right eye: Secondary | ICD-10-CM | POA: Diagnosis not present

## 2020-02-17 DIAGNOSIS — I700 Atherosclerosis of aorta: Secondary | ICD-10-CM | POA: Diagnosis not present

## 2020-02-17 DIAGNOSIS — M359 Systemic involvement of connective tissue, unspecified: Secondary | ICD-10-CM | POA: Diagnosis not present

## 2020-02-17 DIAGNOSIS — Z9689 Presence of other specified functional implants: Secondary | ICD-10-CM | POA: Diagnosis not present

## 2020-02-17 DIAGNOSIS — Z006 Encounter for examination for normal comparison and control in clinical research program: Secondary | ICD-10-CM | POA: Diagnosis not present

## 2020-02-17 DIAGNOSIS — F1024 Alcohol dependence with alcohol-induced mood disorder: Secondary | ICD-10-CM | POA: Diagnosis not present

## 2020-02-17 DIAGNOSIS — J9692 Respiratory failure, unspecified with hypercapnia: Secondary | ICD-10-CM | POA: Diagnosis not present

## 2020-02-17 DIAGNOSIS — Z6825 Body mass index (BMI) 25.0-25.9, adult: Secondary | ICD-10-CM | POA: Diagnosis not present

## 2020-02-17 DIAGNOSIS — L89151 Pressure ulcer of sacral region, stage 1: Secondary | ICD-10-CM | POA: Diagnosis not present

## 2020-02-17 DIAGNOSIS — M456 Ankylosing spondylitis lumbar region: Secondary | ICD-10-CM | POA: Diagnosis not present

## 2020-02-17 DIAGNOSIS — Z2821 Immunization not carried out because of patient refusal: Secondary | ICD-10-CM | POA: Diagnosis not present

## 2020-02-17 DIAGNOSIS — R490 Dysphonia: Secondary | ICD-10-CM | POA: Diagnosis not present

## 2020-02-17 DIAGNOSIS — M797 Fibromyalgia: Secondary | ICD-10-CM | POA: Diagnosis not present

## 2020-02-17 DIAGNOSIS — Z952 Presence of prosthetic heart valve: Secondary | ICD-10-CM | POA: Diagnosis not present

## 2020-02-17 DIAGNOSIS — N1831 Chronic kidney disease, stage 3a: Secondary | ICD-10-CM | POA: Diagnosis not present

## 2020-02-17 DIAGNOSIS — R2232 Localized swelling, mass and lump, left upper limb: Secondary | ICD-10-CM | POA: Diagnosis not present

## 2020-02-17 DIAGNOSIS — H401132 Primary open-angle glaucoma, bilateral, moderate stage: Secondary | ICD-10-CM | POA: Diagnosis not present

## 2020-02-17 DIAGNOSIS — H35722 Serous detachment of retinal pigment epithelium, left eye: Secondary | ICD-10-CM | POA: Diagnosis not present

## 2020-02-17 DIAGNOSIS — M5136 Other intervertebral disc degeneration, lumbar region: Secondary | ICD-10-CM | POA: Diagnosis not present

## 2020-02-17 DIAGNOSIS — H3530 Unspecified macular degeneration: Secondary | ICD-10-CM | POA: Diagnosis not present

## 2020-02-17 DIAGNOSIS — Z602 Problems related to living alone: Secondary | ICD-10-CM | POA: Diagnosis not present

## 2020-02-17 DIAGNOSIS — R627 Adult failure to thrive: Secondary | ICD-10-CM | POA: Diagnosis not present

## 2020-02-17 DIAGNOSIS — M48061 Spinal stenosis, lumbar region without neurogenic claudication: Secondary | ICD-10-CM | POA: Diagnosis not present

## 2020-02-17 DIAGNOSIS — I69351 Hemiplegia and hemiparesis following cerebral infarction affecting right dominant side: Secondary | ICD-10-CM | POA: Diagnosis not present

## 2020-02-17 DIAGNOSIS — I8311 Varicose veins of right lower extremity with inflammation: Secondary | ICD-10-CM | POA: Diagnosis not present

## 2020-02-17 DIAGNOSIS — J309 Allergic rhinitis, unspecified: Secondary | ICD-10-CM | POA: Diagnosis not present

## 2020-02-17 DIAGNOSIS — I5089 Other heart failure: Secondary | ICD-10-CM | POA: Diagnosis not present

## 2020-02-17 DIAGNOSIS — R972 Elevated prostate specific antigen [PSA]: Secondary | ICD-10-CM | POA: Diagnosis not present

## 2020-02-17 DIAGNOSIS — H5203 Hypermetropia, bilateral: Secondary | ICD-10-CM | POA: Diagnosis not present

## 2020-02-17 DIAGNOSIS — C772 Secondary and unspecified malignant neoplasm of intra-abdominal lymph nodes: Secondary | ICD-10-CM | POA: Diagnosis not present

## 2020-02-17 DIAGNOSIS — Z5189 Encounter for other specified aftercare: Secondary | ICD-10-CM | POA: Diagnosis not present

## 2020-02-17 DIAGNOSIS — D6869 Other thrombophilia: Secondary | ICD-10-CM | POA: Diagnosis not present

## 2020-02-17 DIAGNOSIS — I679 Cerebrovascular disease, unspecified: Secondary | ICD-10-CM | POA: Diagnosis not present

## 2020-02-17 DIAGNOSIS — M816 Localized osteoporosis [Lequesne]: Secondary | ICD-10-CM | POA: Diagnosis not present

## 2020-02-17 DIAGNOSIS — J019 Acute sinusitis, unspecified: Secondary | ICD-10-CM | POA: Diagnosis not present

## 2020-02-17 DIAGNOSIS — M5489 Other dorsalgia: Secondary | ICD-10-CM | POA: Diagnosis not present

## 2020-02-17 DIAGNOSIS — M26602 Left temporomandibular joint disorder, unspecified: Secondary | ICD-10-CM | POA: Diagnosis not present

## 2020-02-17 DIAGNOSIS — K589 Irritable bowel syndrome without diarrhea: Secondary | ICD-10-CM | POA: Diagnosis not present

## 2020-02-17 DIAGNOSIS — R7301 Impaired fasting glucose: Secondary | ICD-10-CM | POA: Diagnosis not present

## 2020-02-17 DIAGNOSIS — I2699 Other pulmonary embolism without acute cor pulmonale: Secondary | ICD-10-CM | POA: Diagnosis not present

## 2020-02-17 DIAGNOSIS — Z789 Other specified health status: Secondary | ICD-10-CM | POA: Diagnosis not present

## 2020-02-17 DIAGNOSIS — L820 Inflamed seborrheic keratosis: Secondary | ICD-10-CM | POA: Diagnosis not present

## 2020-02-17 DIAGNOSIS — Q828 Other specified congenital malformations of skin: Secondary | ICD-10-CM | POA: Diagnosis not present

## 2020-02-17 DIAGNOSIS — M4316 Spondylolisthesis, lumbar region: Secondary | ICD-10-CM | POA: Diagnosis not present

## 2020-02-17 DIAGNOSIS — K5730 Diverticulosis of large intestine without perforation or abscess without bleeding: Secondary | ICD-10-CM | POA: Diagnosis not present

## 2020-02-17 DIAGNOSIS — R922 Inconclusive mammogram: Secondary | ICD-10-CM | POA: Diagnosis not present

## 2020-02-17 DIAGNOSIS — Z8639 Personal history of other endocrine, nutritional and metabolic disease: Secondary | ICD-10-CM | POA: Diagnosis not present

## 2020-02-17 DIAGNOSIS — D692 Other nonthrombocytopenic purpura: Secondary | ICD-10-CM | POA: Diagnosis not present

## 2020-02-17 DIAGNOSIS — S82112D Displaced fracture of left tibial spine, subsequent encounter for closed fracture with routine healing: Secondary | ICD-10-CM | POA: Diagnosis not present

## 2020-02-17 DIAGNOSIS — M2042 Other hammer toe(s) (acquired), left foot: Secondary | ICD-10-CM | POA: Diagnosis not present

## 2020-02-17 DIAGNOSIS — R778 Other specified abnormalities of plasma proteins: Secondary | ICD-10-CM | POA: Diagnosis not present

## 2020-02-17 DIAGNOSIS — R238 Other skin changes: Secondary | ICD-10-CM | POA: Diagnosis not present

## 2020-02-17 DIAGNOSIS — Z85820 Personal history of malignant melanoma of skin: Secondary | ICD-10-CM | POA: Diagnosis not present

## 2020-02-17 DIAGNOSIS — C8510 Unspecified B-cell lymphoma, unspecified site: Secondary | ICD-10-CM | POA: Diagnosis not present

## 2020-02-17 DIAGNOSIS — L03311 Cellulitis of abdominal wall: Secondary | ICD-10-CM | POA: Diagnosis not present

## 2020-02-17 DIAGNOSIS — I5042 Chronic combined systolic (congestive) and diastolic (congestive) heart failure: Secondary | ICD-10-CM | POA: Diagnosis not present

## 2020-02-17 DIAGNOSIS — Z9884 Bariatric surgery status: Secondary | ICD-10-CM | POA: Diagnosis not present

## 2020-02-17 DIAGNOSIS — H11153 Pinguecula, bilateral: Secondary | ICD-10-CM | POA: Diagnosis not present

## 2020-02-17 DIAGNOSIS — Z6831 Body mass index (BMI) 31.0-31.9, adult: Secondary | ICD-10-CM | POA: Diagnosis not present

## 2020-02-17 DIAGNOSIS — G510 Bell's palsy: Secondary | ICD-10-CM | POA: Diagnosis not present

## 2020-02-17 DIAGNOSIS — Z4800 Encounter for change or removal of nonsurgical wound dressing: Secondary | ICD-10-CM | POA: Diagnosis not present

## 2020-02-17 DIAGNOSIS — H278 Other specified disorders of lens: Secondary | ICD-10-CM | POA: Diagnosis not present

## 2020-02-17 DIAGNOSIS — C7800 Secondary malignant neoplasm of unspecified lung: Secondary | ICD-10-CM | POA: Diagnosis not present

## 2020-02-17 DIAGNOSIS — L4050 Arthropathic psoriasis, unspecified: Secondary | ICD-10-CM | POA: Diagnosis not present

## 2020-02-17 DIAGNOSIS — Q600 Renal agenesis, unilateral: Secondary | ICD-10-CM | POA: Diagnosis not present

## 2020-02-17 DIAGNOSIS — B9561 Methicillin susceptible Staphylococcus aureus infection as the cause of diseases classified elsewhere: Secondary | ICD-10-CM | POA: Diagnosis not present

## 2020-02-17 DIAGNOSIS — R319 Hematuria, unspecified: Secondary | ICD-10-CM | POA: Diagnosis not present

## 2020-02-17 DIAGNOSIS — Z7901 Long term (current) use of anticoagulants: Secondary | ICD-10-CM | POA: Diagnosis not present

## 2020-02-17 DIAGNOSIS — M25675 Stiffness of left foot, not elsewhere classified: Secondary | ICD-10-CM | POA: Diagnosis not present

## 2020-02-17 DIAGNOSIS — H906 Mixed conductive and sensorineural hearing loss, bilateral: Secondary | ICD-10-CM | POA: Diagnosis not present

## 2020-02-17 DIAGNOSIS — R296 Repeated falls: Secondary | ICD-10-CM | POA: Diagnosis not present

## 2020-02-17 DIAGNOSIS — M5431 Sciatica, right side: Secondary | ICD-10-CM | POA: Diagnosis not present

## 2020-02-17 DIAGNOSIS — I482 Chronic atrial fibrillation, unspecified: Secondary | ICD-10-CM | POA: Diagnosis not present

## 2020-02-17 DIAGNOSIS — Z853 Personal history of malignant neoplasm of breast: Secondary | ICD-10-CM | POA: Diagnosis not present

## 2020-02-17 DIAGNOSIS — L819 Disorder of pigmentation, unspecified: Secondary | ICD-10-CM | POA: Diagnosis not present

## 2020-02-17 DIAGNOSIS — M109 Gout, unspecified: Secondary | ICD-10-CM | POA: Diagnosis not present

## 2020-02-17 DIAGNOSIS — G629 Polyneuropathy, unspecified: Secondary | ICD-10-CM | POA: Diagnosis not present

## 2020-02-17 DIAGNOSIS — J441 Chronic obstructive pulmonary disease with (acute) exacerbation: Secondary | ICD-10-CM | POA: Diagnosis not present

## 2020-02-17 DIAGNOSIS — M6389 Disorders of muscle in diseases classified elsewhere, multiple sites: Secondary | ICD-10-CM | POA: Diagnosis not present

## 2020-02-17 DIAGNOSIS — E46 Unspecified protein-calorie malnutrition: Secondary | ICD-10-CM | POA: Diagnosis not present

## 2020-02-17 DIAGNOSIS — N402 Nodular prostate without lower urinary tract symptoms: Secondary | ICD-10-CM | POA: Diagnosis not present

## 2020-02-17 DIAGNOSIS — R110 Nausea: Secondary | ICD-10-CM | POA: Diagnosis not present

## 2020-02-17 DIAGNOSIS — Z6834 Body mass index (BMI) 34.0-34.9, adult: Secondary | ICD-10-CM | POA: Diagnosis not present

## 2020-02-17 DIAGNOSIS — Z79891 Long term (current) use of opiate analgesic: Secondary | ICD-10-CM | POA: Diagnosis not present

## 2020-02-17 DIAGNOSIS — I083 Combined rheumatic disorders of mitral, aortic and tricuspid valves: Secondary | ICD-10-CM | POA: Diagnosis not present

## 2020-02-17 DIAGNOSIS — S41112D Laceration without foreign body of left upper arm, subsequent encounter: Secondary | ICD-10-CM | POA: Diagnosis not present

## 2020-02-17 DIAGNOSIS — I959 Hypotension, unspecified: Secondary | ICD-10-CM | POA: Diagnosis not present

## 2020-02-17 DIAGNOSIS — S81829A Laceration with foreign body, unspecified lower leg, initial encounter: Secondary | ICD-10-CM | POA: Diagnosis not present

## 2020-02-17 DIAGNOSIS — R911 Solitary pulmonary nodule: Secondary | ICD-10-CM | POA: Diagnosis not present

## 2020-02-17 DIAGNOSIS — S335XXA Sprain of ligaments of lumbar spine, initial encounter: Secondary | ICD-10-CM | POA: Diagnosis not present

## 2020-02-17 DIAGNOSIS — R300 Dysuria: Secondary | ICD-10-CM | POA: Diagnosis not present

## 2020-02-17 DIAGNOSIS — Z9013 Acquired absence of bilateral breasts and nipples: Secondary | ICD-10-CM | POA: Diagnosis not present

## 2020-02-17 DIAGNOSIS — M7502 Adhesive capsulitis of left shoulder: Secondary | ICD-10-CM | POA: Diagnosis not present

## 2020-02-17 DIAGNOSIS — M85852 Other specified disorders of bone density and structure, left thigh: Secondary | ICD-10-CM | POA: Diagnosis not present

## 2020-02-17 DIAGNOSIS — M19031 Primary osteoarthritis, right wrist: Secondary | ICD-10-CM | POA: Diagnosis not present

## 2020-02-17 DIAGNOSIS — E538 Deficiency of other specified B group vitamins: Secondary | ICD-10-CM | POA: Diagnosis not present

## 2020-02-17 DIAGNOSIS — G35 Multiple sclerosis: Secondary | ICD-10-CM | POA: Diagnosis not present

## 2020-02-17 DIAGNOSIS — Z7409 Other reduced mobility: Secondary | ICD-10-CM | POA: Diagnosis not present

## 2020-02-17 DIAGNOSIS — F338 Other recurrent depressive disorders: Secondary | ICD-10-CM | POA: Diagnosis not present

## 2020-02-17 DIAGNOSIS — H26493 Other secondary cataract, bilateral: Secondary | ICD-10-CM | POA: Diagnosis not present

## 2020-02-17 DIAGNOSIS — H33312 Horseshoe tear of retina without detachment, left eye: Secondary | ICD-10-CM | POA: Diagnosis not present

## 2020-02-17 DIAGNOSIS — Z885 Allergy status to narcotic agent status: Secondary | ICD-10-CM | POA: Diagnosis not present

## 2020-02-17 DIAGNOSIS — E063 Autoimmune thyroiditis: Secondary | ICD-10-CM | POA: Diagnosis not present

## 2020-02-17 DIAGNOSIS — E1122 Type 2 diabetes mellitus with diabetic chronic kidney disease: Secondary | ICD-10-CM | POA: Diagnosis not present

## 2020-02-17 DIAGNOSIS — G43009 Migraine without aura, not intractable, without status migrainosus: Secondary | ICD-10-CM | POA: Diagnosis not present

## 2020-02-17 DIAGNOSIS — S0091XA Abrasion of unspecified part of head, initial encounter: Secondary | ICD-10-CM | POA: Diagnosis not present

## 2020-02-17 DIAGNOSIS — N182 Chronic kidney disease, stage 2 (mild): Secondary | ICD-10-CM | POA: Diagnosis not present

## 2020-02-17 DIAGNOSIS — C7802 Secondary malignant neoplasm of left lung: Secondary | ICD-10-CM | POA: Diagnosis not present

## 2020-02-17 DIAGNOSIS — I4819 Other persistent atrial fibrillation: Secondary | ICD-10-CM | POA: Diagnosis not present

## 2020-02-17 DIAGNOSIS — N183 Chronic kidney disease, stage 3 unspecified: Secondary | ICD-10-CM | POA: Diagnosis not present

## 2020-02-17 DIAGNOSIS — G300 Alzheimer's disease with early onset: Secondary | ICD-10-CM | POA: Diagnosis not present

## 2020-02-17 DIAGNOSIS — C187 Malignant neoplasm of sigmoid colon: Secondary | ICD-10-CM | POA: Diagnosis not present

## 2020-02-17 DIAGNOSIS — B961 Klebsiella pneumoniae [K. pneumoniae] as the cause of diseases classified elsewhere: Secondary | ICD-10-CM | POA: Diagnosis not present

## 2020-02-17 DIAGNOSIS — R338 Other retention of urine: Secondary | ICD-10-CM | POA: Diagnosis not present

## 2020-02-17 DIAGNOSIS — D689 Coagulation defect, unspecified: Secondary | ICD-10-CM | POA: Diagnosis not present

## 2020-02-17 DIAGNOSIS — L578 Other skin changes due to chronic exposure to nonionizing radiation: Secondary | ICD-10-CM | POA: Diagnosis not present

## 2020-02-17 DIAGNOSIS — R140 Abdominal distension (gaseous): Secondary | ICD-10-CM | POA: Diagnosis not present

## 2020-02-17 DIAGNOSIS — R002 Palpitations: Secondary | ICD-10-CM | POA: Diagnosis not present

## 2020-02-17 DIAGNOSIS — H40001 Preglaucoma, unspecified, right eye: Secondary | ICD-10-CM | POA: Diagnosis not present

## 2020-02-17 DIAGNOSIS — Z1152 Encounter for screening for COVID-19: Secondary | ICD-10-CM | POA: Diagnosis not present

## 2020-02-17 DIAGNOSIS — I951 Orthostatic hypotension: Secondary | ICD-10-CM | POA: Diagnosis not present

## 2020-02-17 DIAGNOSIS — R1319 Other dysphagia: Secondary | ICD-10-CM | POA: Diagnosis not present

## 2020-02-17 DIAGNOSIS — Z96651 Presence of right artificial knee joint: Secondary | ICD-10-CM | POA: Diagnosis not present

## 2020-02-17 DIAGNOSIS — N400 Enlarged prostate without lower urinary tract symptoms: Secondary | ICD-10-CM | POA: Diagnosis not present

## 2020-02-17 DIAGNOSIS — R4189 Other symptoms and signs involving cognitive functions and awareness: Secondary | ICD-10-CM | POA: Diagnosis not present

## 2020-02-17 DIAGNOSIS — F1190 Opioid use, unspecified, uncomplicated: Secondary | ICD-10-CM | POA: Diagnosis not present

## 2020-02-17 DIAGNOSIS — C50412 Malignant neoplasm of upper-outer quadrant of left female breast: Secondary | ICD-10-CM | POA: Diagnosis not present

## 2020-02-17 DIAGNOSIS — Z96653 Presence of artificial knee joint, bilateral: Secondary | ICD-10-CM | POA: Diagnosis not present

## 2020-02-17 DIAGNOSIS — Z8249 Family history of ischemic heart disease and other diseases of the circulatory system: Secondary | ICD-10-CM | POA: Diagnosis not present

## 2020-02-17 DIAGNOSIS — H40011 Open angle with borderline findings, low risk, right eye: Secondary | ICD-10-CM | POA: Diagnosis not present

## 2020-02-17 DIAGNOSIS — Z955 Presence of coronary angioplasty implant and graft: Secondary | ICD-10-CM | POA: Diagnosis not present

## 2020-02-17 DIAGNOSIS — Z8041 Family history of malignant neoplasm of ovary: Secondary | ICD-10-CM | POA: Diagnosis not present

## 2020-02-17 DIAGNOSIS — R197 Diarrhea, unspecified: Secondary | ICD-10-CM | POA: Diagnosis not present

## 2020-02-17 DIAGNOSIS — C538 Malignant neoplasm of overlapping sites of cervix uteri: Secondary | ICD-10-CM | POA: Diagnosis not present

## 2020-02-17 DIAGNOSIS — S199XXA Unspecified injury of neck, initial encounter: Secondary | ICD-10-CM | POA: Diagnosis not present

## 2020-02-17 DIAGNOSIS — I4821 Permanent atrial fibrillation: Secondary | ICD-10-CM | POA: Diagnosis not present

## 2020-02-17 DIAGNOSIS — R748 Abnormal levels of other serum enzymes: Secondary | ICD-10-CM | POA: Diagnosis not present

## 2020-02-17 DIAGNOSIS — S46012A Strain of muscle(s) and tendon(s) of the rotator cuff of left shoulder, initial encounter: Secondary | ICD-10-CM | POA: Diagnosis not present

## 2020-02-17 DIAGNOSIS — E1143 Type 2 diabetes mellitus with diabetic autonomic (poly)neuropathy: Secondary | ICD-10-CM | POA: Diagnosis not present

## 2020-02-17 DIAGNOSIS — Z87891 Personal history of nicotine dependence: Secondary | ICD-10-CM | POA: Diagnosis not present

## 2020-02-17 DIAGNOSIS — E034 Atrophy of thyroid (acquired): Secondary | ICD-10-CM | POA: Diagnosis not present

## 2020-02-17 DIAGNOSIS — C7651 Malignant neoplasm of right lower limb: Secondary | ICD-10-CM | POA: Diagnosis not present

## 2020-02-17 DIAGNOSIS — M25551 Pain in right hip: Secondary | ICD-10-CM | POA: Diagnosis not present

## 2020-02-17 DIAGNOSIS — Z841 Family history of disorders of kidney and ureter: Secondary | ICD-10-CM | POA: Diagnosis not present

## 2020-02-17 DIAGNOSIS — Z8542 Personal history of malignant neoplasm of other parts of uterus: Secondary | ICD-10-CM | POA: Diagnosis not present

## 2020-02-17 DIAGNOSIS — N179 Acute kidney failure, unspecified: Secondary | ICD-10-CM | POA: Diagnosis not present

## 2020-02-17 DIAGNOSIS — Z8616 Personal history of COVID-19: Secondary | ICD-10-CM | POA: Diagnosis not present

## 2020-02-17 DIAGNOSIS — R945 Abnormal results of liver function studies: Secondary | ICD-10-CM | POA: Diagnosis not present

## 2020-02-17 DIAGNOSIS — N3001 Acute cystitis with hematuria: Secondary | ICD-10-CM | POA: Diagnosis not present

## 2020-02-17 DIAGNOSIS — W19XXXD Unspecified fall, subsequent encounter: Secondary | ICD-10-CM | POA: Diagnosis not present

## 2020-02-17 DIAGNOSIS — M25611 Stiffness of right shoulder, not elsewhere classified: Secondary | ICD-10-CM | POA: Diagnosis not present

## 2020-02-17 DIAGNOSIS — M4800 Spinal stenosis, site unspecified: Secondary | ICD-10-CM | POA: Diagnosis not present

## 2020-02-17 DIAGNOSIS — L309 Dermatitis, unspecified: Secondary | ICD-10-CM | POA: Diagnosis not present

## 2020-02-17 DIAGNOSIS — H40002 Preglaucoma, unspecified, left eye: Secondary | ICD-10-CM | POA: Diagnosis not present

## 2020-02-17 DIAGNOSIS — S0990XA Unspecified injury of head, initial encounter: Secondary | ICD-10-CM | POA: Diagnosis not present

## 2020-02-17 DIAGNOSIS — F329 Major depressive disorder, single episode, unspecified: Secondary | ICD-10-CM | POA: Diagnosis not present

## 2020-02-17 DIAGNOSIS — K317 Polyp of stomach and duodenum: Secondary | ICD-10-CM | POA: Diagnosis not present

## 2020-02-17 DIAGNOSIS — I5033 Acute on chronic diastolic (congestive) heart failure: Secondary | ICD-10-CM | POA: Diagnosis not present

## 2020-02-17 DIAGNOSIS — R29818 Other symptoms and signs involving the nervous system: Secondary | ICD-10-CM | POA: Diagnosis not present

## 2020-02-17 DIAGNOSIS — E860 Dehydration: Secondary | ICD-10-CM | POA: Diagnosis not present

## 2020-02-17 DIAGNOSIS — M546 Pain in thoracic spine: Secondary | ICD-10-CM | POA: Diagnosis not present

## 2020-02-17 DIAGNOSIS — G214 Vascular parkinsonism: Secondary | ICD-10-CM | POA: Diagnosis not present

## 2020-02-17 DIAGNOSIS — Z8673 Personal history of transient ischemic attack (TIA), and cerebral infarction without residual deficits: Secondary | ICD-10-CM | POA: Diagnosis not present

## 2020-02-17 DIAGNOSIS — R928 Other abnormal and inconclusive findings on diagnostic imaging of breast: Secondary | ICD-10-CM | POA: Diagnosis not present

## 2020-02-17 DIAGNOSIS — Z87442 Personal history of urinary calculi: Secondary | ICD-10-CM | POA: Diagnosis not present

## 2020-02-17 DIAGNOSIS — D508 Other iron deficiency anemias: Secondary | ICD-10-CM | POA: Diagnosis not present

## 2020-02-17 DIAGNOSIS — I428 Other cardiomyopathies: Secondary | ICD-10-CM | POA: Diagnosis not present

## 2020-02-17 DIAGNOSIS — M47814 Spondylosis without myelopathy or radiculopathy, thoracic region: Secondary | ICD-10-CM | POA: Diagnosis not present

## 2020-02-17 DIAGNOSIS — M62552 Muscle wasting and atrophy, not elsewhere classified, left thigh: Secondary | ICD-10-CM | POA: Diagnosis not present

## 2020-02-17 DIAGNOSIS — R2689 Other abnormalities of gait and mobility: Secondary | ICD-10-CM | POA: Diagnosis not present

## 2020-02-17 DIAGNOSIS — Z95828 Presence of other vascular implants and grafts: Secondary | ICD-10-CM | POA: Diagnosis not present

## 2020-02-17 DIAGNOSIS — S83241A Other tear of medial meniscus, current injury, right knee, initial encounter: Secondary | ICD-10-CM | POA: Diagnosis not present

## 2020-02-17 DIAGNOSIS — M170 Bilateral primary osteoarthritis of knee: Secondary | ICD-10-CM | POA: Diagnosis not present

## 2020-02-17 DIAGNOSIS — K921 Melena: Secondary | ICD-10-CM | POA: Diagnosis not present

## 2020-02-17 DIAGNOSIS — S6991XA Unspecified injury of right wrist, hand and finger(s), initial encounter: Secondary | ICD-10-CM | POA: Diagnosis not present

## 2020-02-17 DIAGNOSIS — K5792 Diverticulitis of intestine, part unspecified, without perforation or abscess without bleeding: Secondary | ICD-10-CM | POA: Diagnosis not present

## 2020-02-17 DIAGNOSIS — M545 Low back pain: Secondary | ICD-10-CM | POA: Diagnosis not present

## 2020-02-17 DIAGNOSIS — R194 Change in bowel habit: Secondary | ICD-10-CM | POA: Diagnosis not present

## 2020-02-17 DIAGNOSIS — M961 Postlaminectomy syndrome, not elsewhere classified: Secondary | ICD-10-CM | POA: Diagnosis not present

## 2020-02-17 DIAGNOSIS — N186 End stage renal disease: Secondary | ICD-10-CM | POA: Diagnosis not present

## 2020-02-17 DIAGNOSIS — T8141XA Infection following a procedure, superficial incisional surgical site, initial encounter: Secondary | ICD-10-CM | POA: Diagnosis not present

## 2020-02-17 DIAGNOSIS — Z23 Encounter for immunization: Secondary | ICD-10-CM | POA: Diagnosis not present

## 2020-02-17 DIAGNOSIS — J9610 Chronic respiratory failure, unspecified whether with hypoxia or hypercapnia: Secondary | ICD-10-CM | POA: Diagnosis not present

## 2020-02-17 DIAGNOSIS — L2084 Intrinsic (allergic) eczema: Secondary | ICD-10-CM | POA: Diagnosis not present

## 2020-02-17 DIAGNOSIS — Z791 Long term (current) use of non-steroidal anti-inflammatories (NSAID): Secondary | ICD-10-CM | POA: Diagnosis not present

## 2020-02-17 DIAGNOSIS — I5022 Chronic systolic (congestive) heart failure: Secondary | ICD-10-CM | POA: Diagnosis not present

## 2020-02-17 DIAGNOSIS — S52032E Displaced fracture of olecranon process with intraarticular extension of left ulna, subsequent encounter for open fracture type I or II with routine healing: Secondary | ICD-10-CM | POA: Diagnosis not present

## 2020-02-17 DIAGNOSIS — R41841 Cognitive communication deficit: Secondary | ICD-10-CM | POA: Diagnosis not present

## 2020-02-17 DIAGNOSIS — J439 Emphysema, unspecified: Secondary | ICD-10-CM | POA: Diagnosis not present

## 2020-02-17 DIAGNOSIS — Z760 Encounter for issue of repeat prescription: Secondary | ICD-10-CM | POA: Diagnosis not present

## 2020-02-17 DIAGNOSIS — R0989 Other specified symptoms and signs involving the circulatory and respiratory systems: Secondary | ICD-10-CM | POA: Diagnosis not present

## 2020-02-17 DIAGNOSIS — M810 Age-related osteoporosis without current pathological fracture: Secondary | ICD-10-CM | POA: Diagnosis not present

## 2020-02-17 DIAGNOSIS — M1049 Other secondary gout, multiple sites: Secondary | ICD-10-CM | POA: Diagnosis not present

## 2020-02-17 DIAGNOSIS — R269 Unspecified abnormalities of gait and mobility: Secondary | ICD-10-CM | POA: Diagnosis not present

## 2020-02-17 DIAGNOSIS — N2889 Other specified disorders of kidney and ureter: Secondary | ICD-10-CM | POA: Diagnosis not present

## 2020-02-17 DIAGNOSIS — R7881 Bacteremia: Secondary | ICD-10-CM | POA: Diagnosis not present

## 2020-02-17 DIAGNOSIS — Z8739 Personal history of other diseases of the musculoskeletal system and connective tissue: Secondary | ICD-10-CM | POA: Diagnosis not present

## 2020-02-17 DIAGNOSIS — S022XXA Fracture of nasal bones, initial encounter for closed fracture: Secondary | ICD-10-CM | POA: Diagnosis not present

## 2020-02-17 DIAGNOSIS — R58 Hemorrhage, not elsewhere classified: Secondary | ICD-10-CM | POA: Diagnosis not present

## 2020-02-17 DIAGNOSIS — D473 Essential (hemorrhagic) thrombocythemia: Secondary | ICD-10-CM | POA: Diagnosis not present

## 2020-02-17 DIAGNOSIS — H6981 Other specified disorders of Eustachian tube, right ear: Secondary | ICD-10-CM | POA: Diagnosis not present

## 2020-02-17 DIAGNOSIS — N184 Chronic kidney disease, stage 4 (severe): Secondary | ICD-10-CM | POA: Diagnosis not present

## 2020-02-17 DIAGNOSIS — J4530 Mild persistent asthma, uncomplicated: Secondary | ICD-10-CM | POA: Diagnosis not present

## 2020-02-17 DIAGNOSIS — R001 Bradycardia, unspecified: Secondary | ICD-10-CM | POA: Diagnosis not present

## 2020-02-17 DIAGNOSIS — H0102A Squamous blepharitis right eye, upper and lower eyelids: Secondary | ICD-10-CM | POA: Diagnosis not present

## 2020-02-17 DIAGNOSIS — G3183 Dementia with Lewy bodies: Secondary | ICD-10-CM | POA: Diagnosis not present

## 2020-02-17 DIAGNOSIS — C4491 Basal cell carcinoma of skin, unspecified: Secondary | ICD-10-CM | POA: Diagnosis not present

## 2020-02-17 DIAGNOSIS — R221 Localized swelling, mass and lump, neck: Secondary | ICD-10-CM | POA: Diagnosis not present

## 2020-02-17 DIAGNOSIS — H5213 Myopia, bilateral: Secondary | ICD-10-CM | POA: Diagnosis not present

## 2020-02-17 DIAGNOSIS — J849 Interstitial pulmonary disease, unspecified: Secondary | ICD-10-CM | POA: Diagnosis not present

## 2020-02-17 DIAGNOSIS — Z1639 Resistance to other specified antimicrobial drug: Secondary | ICD-10-CM | POA: Diagnosis not present

## 2020-02-17 DIAGNOSIS — Z4420 Encounter for fitting and adjustment of artificial eye, unspecified: Secondary | ICD-10-CM | POA: Diagnosis not present

## 2020-02-17 DIAGNOSIS — S81802D Unspecified open wound, left lower leg, subsequent encounter: Secondary | ICD-10-CM | POA: Diagnosis not present

## 2020-02-17 DIAGNOSIS — G8918 Other acute postprocedural pain: Secondary | ICD-10-CM | POA: Diagnosis not present

## 2020-02-17 DIAGNOSIS — K5790 Diverticulosis of intestine, part unspecified, without perforation or abscess without bleeding: Secondary | ICD-10-CM | POA: Diagnosis not present

## 2020-02-17 DIAGNOSIS — H35033 Hypertensive retinopathy, bilateral: Secondary | ICD-10-CM | POA: Diagnosis not present

## 2020-02-17 DIAGNOSIS — Z993 Dependence on wheelchair: Secondary | ICD-10-CM | POA: Diagnosis not present

## 2020-02-17 DIAGNOSIS — J9611 Chronic respiratory failure with hypoxia: Secondary | ICD-10-CM | POA: Diagnosis not present

## 2020-02-17 DIAGNOSIS — H539 Unspecified visual disturbance: Secondary | ICD-10-CM | POA: Diagnosis not present

## 2020-02-17 DIAGNOSIS — S46811A Strain of other muscles, fascia and tendons at shoulder and upper arm level, right arm, initial encounter: Secondary | ICD-10-CM | POA: Diagnosis not present

## 2020-02-17 DIAGNOSIS — L97522 Non-pressure chronic ulcer of other part of left foot with fat layer exposed: Secondary | ICD-10-CM | POA: Diagnosis not present

## 2020-02-17 DIAGNOSIS — I10 Essential (primary) hypertension: Secondary | ICD-10-CM | POA: Diagnosis not present

## 2020-02-17 DIAGNOSIS — R600 Localized edema: Secondary | ICD-10-CM | POA: Diagnosis not present

## 2020-02-17 DIAGNOSIS — Z01810 Encounter for preprocedural cardiovascular examination: Secondary | ICD-10-CM | POA: Diagnosis not present

## 2020-02-17 DIAGNOSIS — H9193 Unspecified hearing loss, bilateral: Secondary | ICD-10-CM | POA: Diagnosis not present

## 2020-02-17 DIAGNOSIS — B351 Tinea unguium: Secondary | ICD-10-CM | POA: Diagnosis not present

## 2020-02-17 DIAGNOSIS — I5043 Acute on chronic combined systolic (congestive) and diastolic (congestive) heart failure: Secondary | ICD-10-CM | POA: Diagnosis not present

## 2020-02-17 DIAGNOSIS — S99911A Unspecified injury of right ankle, initial encounter: Secondary | ICD-10-CM | POA: Diagnosis not present

## 2020-02-17 DIAGNOSIS — I69354 Hemiplegia and hemiparesis following cerebral infarction affecting left non-dominant side: Secondary | ICD-10-CM | POA: Diagnosis not present

## 2020-02-17 DIAGNOSIS — J189 Pneumonia, unspecified organism: Secondary | ICD-10-CM | POA: Diagnosis not present

## 2020-02-17 DIAGNOSIS — D2372 Other benign neoplasm of skin of left lower limb, including hip: Secondary | ICD-10-CM | POA: Diagnosis not present

## 2020-02-17 DIAGNOSIS — I252 Old myocardial infarction: Secondary | ICD-10-CM | POA: Diagnosis not present

## 2020-02-17 DIAGNOSIS — Z86010 Personal history of colonic polyps: Secondary | ICD-10-CM | POA: Diagnosis not present

## 2020-02-17 DIAGNOSIS — R3914 Feeling of incomplete bladder emptying: Secondary | ICD-10-CM | POA: Diagnosis not present

## 2020-02-17 DIAGNOSIS — Z6833 Body mass index (BMI) 33.0-33.9, adult: Secondary | ICD-10-CM | POA: Diagnosis not present

## 2020-02-17 DIAGNOSIS — R29898 Other symptoms and signs involving the musculoskeletal system: Secondary | ICD-10-CM | POA: Diagnosis not present

## 2020-02-17 DIAGNOSIS — R279 Unspecified lack of coordination: Secondary | ICD-10-CM | POA: Diagnosis not present

## 2020-02-17 DIAGNOSIS — F325 Major depressive disorder, single episode, in full remission: Secondary | ICD-10-CM | POA: Diagnosis not present

## 2020-02-17 DIAGNOSIS — N2581 Secondary hyperparathyroidism of renal origin: Secondary | ICD-10-CM | POA: Diagnosis not present

## 2020-02-17 DIAGNOSIS — Z951 Presence of aortocoronary bypass graft: Secondary | ICD-10-CM | POA: Diagnosis not present

## 2020-02-17 DIAGNOSIS — E875 Hyperkalemia: Secondary | ICD-10-CM | POA: Diagnosis not present

## 2020-02-17 DIAGNOSIS — F909 Attention-deficit hyperactivity disorder, unspecified type: Secondary | ICD-10-CM | POA: Diagnosis not present

## 2020-02-17 DIAGNOSIS — B9620 Unspecified Escherichia coli [E. coli] as the cause of diseases classified elsewhere: Secondary | ICD-10-CM | POA: Diagnosis not present

## 2020-02-17 DIAGNOSIS — K760 Fatty (change of) liver, not elsewhere classified: Secondary | ICD-10-CM | POA: Diagnosis not present

## 2020-02-17 DIAGNOSIS — I447 Left bundle-branch block, unspecified: Secondary | ICD-10-CM | POA: Diagnosis not present

## 2020-02-17 DIAGNOSIS — Z923 Personal history of irradiation: Secondary | ICD-10-CM | POA: Diagnosis not present

## 2020-02-17 DIAGNOSIS — C7801 Secondary malignant neoplasm of right lung: Secondary | ICD-10-CM | POA: Diagnosis not present

## 2020-02-17 DIAGNOSIS — R634 Abnormal weight loss: Secondary | ICD-10-CM | POA: Diagnosis not present

## 2020-02-17 DIAGNOSIS — Z96641 Presence of right artificial hip joint: Secondary | ICD-10-CM | POA: Diagnosis not present

## 2020-02-17 DIAGNOSIS — L739 Follicular disorder, unspecified: Secondary | ICD-10-CM | POA: Diagnosis not present

## 2020-02-17 DIAGNOSIS — R351 Nocturia: Secondary | ICD-10-CM | POA: Diagnosis not present

## 2020-02-17 DIAGNOSIS — I5041 Acute combined systolic (congestive) and diastolic (congestive) heart failure: Secondary | ICD-10-CM | POA: Diagnosis not present

## 2020-02-17 DIAGNOSIS — R410 Disorientation, unspecified: Secondary | ICD-10-CM | POA: Diagnosis not present

## 2020-02-17 DIAGNOSIS — I420 Dilated cardiomyopathy: Secondary | ICD-10-CM | POA: Diagnosis not present

## 2020-02-17 DIAGNOSIS — G3184 Mild cognitive impairment, so stated: Secondary | ICD-10-CM | POA: Diagnosis not present

## 2020-02-17 DIAGNOSIS — Z981 Arthrodesis status: Secondary | ICD-10-CM | POA: Diagnosis not present

## 2020-02-17 DIAGNOSIS — N201 Calculus of ureter: Secondary | ICD-10-CM | POA: Diagnosis not present

## 2020-02-17 DIAGNOSIS — Z974 Presence of external hearing-aid: Secondary | ICD-10-CM | POA: Diagnosis not present

## 2020-02-17 DIAGNOSIS — J418 Mixed simple and mucopurulent chronic bronchitis: Secondary | ICD-10-CM | POA: Diagnosis not present

## 2020-02-17 DIAGNOSIS — N3000 Acute cystitis without hematuria: Secondary | ICD-10-CM | POA: Diagnosis not present

## 2020-02-17 DIAGNOSIS — M94261 Chondromalacia, right knee: Secondary | ICD-10-CM | POA: Diagnosis not present

## 2020-02-17 DIAGNOSIS — E1159 Type 2 diabetes mellitus with other circulatory complications: Secondary | ICD-10-CM | POA: Diagnosis not present

## 2020-02-17 DIAGNOSIS — C159 Malignant neoplasm of esophagus, unspecified: Secondary | ICD-10-CM | POA: Diagnosis not present

## 2020-02-17 DIAGNOSIS — M609 Myositis, unspecified: Secondary | ICD-10-CM | POA: Diagnosis not present

## 2020-02-17 DIAGNOSIS — Z20828 Contact with and (suspected) exposure to other viral communicable diseases: Secondary | ICD-10-CM | POA: Diagnosis not present

## 2020-02-17 DIAGNOSIS — E038 Other specified hypothyroidism: Secondary | ICD-10-CM | POA: Diagnosis not present

## 2020-02-17 DIAGNOSIS — Z95810 Presence of automatic (implantable) cardiac defibrillator: Secondary | ICD-10-CM | POA: Diagnosis not present

## 2020-02-17 DIAGNOSIS — M5441 Lumbago with sciatica, right side: Secondary | ICD-10-CM | POA: Diagnosis not present

## 2020-02-17 DIAGNOSIS — F4320 Adjustment disorder, unspecified: Secondary | ICD-10-CM | POA: Diagnosis not present

## 2020-02-17 DIAGNOSIS — Z800 Family history of malignant neoplasm of digestive organs: Secondary | ICD-10-CM | POA: Diagnosis not present

## 2020-02-17 DIAGNOSIS — K449 Diaphragmatic hernia without obstruction or gangrene: Secondary | ICD-10-CM | POA: Diagnosis not present

## 2020-02-17 DIAGNOSIS — I25810 Atherosclerosis of coronary artery bypass graft(s) without angina pectoris: Secondary | ICD-10-CM | POA: Diagnosis not present

## 2020-02-17 DIAGNOSIS — G309 Alzheimer's disease, unspecified: Secondary | ICD-10-CM | POA: Diagnosis not present

## 2020-02-17 DIAGNOSIS — I712 Thoracic aortic aneurysm, without rupture: Secondary | ICD-10-CM | POA: Diagnosis not present

## 2020-02-17 DIAGNOSIS — Z9981 Dependence on supplemental oxygen: Secondary | ICD-10-CM | POA: Diagnosis not present

## 2020-02-17 DIAGNOSIS — M25511 Pain in right shoulder: Secondary | ICD-10-CM | POA: Diagnosis not present

## 2020-02-17 DIAGNOSIS — F339 Major depressive disorder, recurrent, unspecified: Secondary | ICD-10-CM | POA: Diagnosis not present

## 2020-02-17 DIAGNOSIS — Z89611 Acquired absence of right leg above knee: Secondary | ICD-10-CM | POA: Diagnosis not present

## 2020-02-17 DIAGNOSIS — Z466 Encounter for fitting and adjustment of urinary device: Secondary | ICD-10-CM | POA: Diagnosis not present

## 2020-02-17 DIAGNOSIS — M5413 Radiculopathy, cervicothoracic region: Secondary | ICD-10-CM | POA: Diagnosis not present

## 2020-02-17 DIAGNOSIS — Z0000 Encounter for general adult medical examination without abnormal findings: Secondary | ICD-10-CM | POA: Diagnosis not present

## 2020-02-17 DIAGNOSIS — D0421 Carcinoma in situ of skin of right ear and external auricular canal: Secondary | ICD-10-CM | POA: Diagnosis not present

## 2020-02-17 DIAGNOSIS — M19011 Primary osteoarthritis, right shoulder: Secondary | ICD-10-CM | POA: Diagnosis not present

## 2020-02-17 DIAGNOSIS — C7911 Secondary malignant neoplasm of bladder: Secondary | ICD-10-CM | POA: Diagnosis not present

## 2020-02-17 DIAGNOSIS — I63529 Cerebral infarction due to unspecified occlusion or stenosis of unspecified anterior cerebral artery: Secondary | ICD-10-CM | POA: Diagnosis not present

## 2020-02-17 DIAGNOSIS — R7401 Elevation of levels of liver transaminase levels: Secondary | ICD-10-CM | POA: Diagnosis not present

## 2020-02-17 DIAGNOSIS — M169 Osteoarthritis of hip, unspecified: Secondary | ICD-10-CM | POA: Diagnosis not present

## 2020-02-17 DIAGNOSIS — R7302 Impaired glucose tolerance (oral): Secondary | ICD-10-CM | POA: Diagnosis not present

## 2020-02-17 DIAGNOSIS — D62 Acute posthemorrhagic anemia: Secondary | ICD-10-CM | POA: Diagnosis not present

## 2020-02-17 DIAGNOSIS — Z7951 Long term (current) use of inhaled steroids: Secondary | ICD-10-CM | POA: Diagnosis not present

## 2020-02-17 DIAGNOSIS — R946 Abnormal results of thyroid function studies: Secondary | ICD-10-CM | POA: Diagnosis not present

## 2020-02-17 DIAGNOSIS — M5032 Other cervical disc degeneration, mid-cervical region, unspecified level: Secondary | ICD-10-CM | POA: Diagnosis not present

## 2020-02-17 DIAGNOSIS — L97812 Non-pressure chronic ulcer of other part of right lower leg with fat layer exposed: Secondary | ICD-10-CM | POA: Diagnosis not present

## 2020-02-17 DIAGNOSIS — Z720 Tobacco use: Secondary | ICD-10-CM | POA: Diagnosis not present

## 2020-02-17 DIAGNOSIS — M5416 Radiculopathy, lumbar region: Secondary | ICD-10-CM | POA: Diagnosis not present

## 2020-02-17 DIAGNOSIS — S0081XA Abrasion of other part of head, initial encounter: Secondary | ICD-10-CM | POA: Diagnosis not present

## 2020-02-17 DIAGNOSIS — H21233 Degeneration of iris (pigmentary), bilateral: Secondary | ICD-10-CM | POA: Diagnosis not present

## 2020-02-17 DIAGNOSIS — G301 Alzheimer's disease with late onset: Secondary | ICD-10-CM | POA: Diagnosis not present

## 2020-02-17 DIAGNOSIS — S52501A Unspecified fracture of the lower end of right radius, initial encounter for closed fracture: Secondary | ICD-10-CM | POA: Diagnosis not present

## 2020-02-17 DIAGNOSIS — S83221A Peripheral tear of medial meniscus, current injury, right knee, initial encounter: Secondary | ICD-10-CM | POA: Diagnosis not present

## 2020-02-18 DIAGNOSIS — M170 Bilateral primary osteoarthritis of knee: Secondary | ICD-10-CM | POA: Diagnosis not present

## 2020-02-18 DIAGNOSIS — Z8673 Personal history of transient ischemic attack (TIA), and cerebral infarction without residual deficits: Secondary | ICD-10-CM | POA: Diagnosis not present

## 2020-02-18 DIAGNOSIS — K2970 Gastritis, unspecified, without bleeding: Secondary | ICD-10-CM | POA: Diagnosis not present

## 2020-02-18 DIAGNOSIS — M79605 Pain in left leg: Secondary | ICD-10-CM | POA: Diagnosis not present

## 2020-02-18 DIAGNOSIS — R110 Nausea: Secondary | ICD-10-CM | POA: Diagnosis not present

## 2020-02-18 DIAGNOSIS — R509 Fever, unspecified: Secondary | ICD-10-CM | POA: Diagnosis not present

## 2020-02-18 DIAGNOSIS — L89322 Pressure ulcer of left buttock, stage 2: Secondary | ICD-10-CM | POA: Diagnosis not present

## 2020-02-18 DIAGNOSIS — L308 Other specified dermatitis: Secondary | ICD-10-CM | POA: Diagnosis not present

## 2020-02-18 DIAGNOSIS — J0140 Acute pansinusitis, unspecified: Secondary | ICD-10-CM | POA: Diagnosis not present

## 2020-02-18 DIAGNOSIS — G609 Hereditary and idiopathic neuropathy, unspecified: Secondary | ICD-10-CM | POA: Diagnosis not present

## 2020-02-18 DIAGNOSIS — H9193 Unspecified hearing loss, bilateral: Secondary | ICD-10-CM | POA: Diagnosis not present

## 2020-02-18 DIAGNOSIS — R0683 Snoring: Secondary | ICD-10-CM | POA: Diagnosis not present

## 2020-02-18 DIAGNOSIS — I361 Nonrheumatic tricuspid (valve) insufficiency: Secondary | ICD-10-CM | POA: Diagnosis not present

## 2020-02-18 DIAGNOSIS — S3282XA Multiple fractures of pelvis without disruption of pelvic ring, initial encounter for closed fracture: Secondary | ICD-10-CM | POA: Diagnosis not present

## 2020-02-18 DIAGNOSIS — M4310 Spondylolisthesis, site unspecified: Secondary | ICD-10-CM | POA: Diagnosis not present

## 2020-02-18 DIAGNOSIS — I69351 Hemiplegia and hemiparesis following cerebral infarction affecting right dominant side: Secondary | ICD-10-CM | POA: Diagnosis not present

## 2020-02-18 DIAGNOSIS — R208 Other disturbances of skin sensation: Secondary | ICD-10-CM | POA: Diagnosis not present

## 2020-02-18 DIAGNOSIS — Z7952 Long term (current) use of systemic steroids: Secondary | ICD-10-CM | POA: Diagnosis not present

## 2020-02-18 DIAGNOSIS — R001 Bradycardia, unspecified: Secondary | ICD-10-CM | POA: Diagnosis not present

## 2020-02-18 DIAGNOSIS — E1065 Type 1 diabetes mellitus with hyperglycemia: Secondary | ICD-10-CM | POA: Diagnosis not present

## 2020-02-18 DIAGNOSIS — R202 Paresthesia of skin: Secondary | ICD-10-CM | POA: Diagnosis not present

## 2020-02-18 DIAGNOSIS — Z1211 Encounter for screening for malignant neoplasm of colon: Secondary | ICD-10-CM | POA: Diagnosis not present

## 2020-02-18 DIAGNOSIS — C569 Malignant neoplasm of unspecified ovary: Secondary | ICD-10-CM | POA: Diagnosis not present

## 2020-02-18 DIAGNOSIS — E669 Obesity, unspecified: Secondary | ICD-10-CM | POA: Diagnosis not present

## 2020-02-18 DIAGNOSIS — K31811 Angiodysplasia of stomach and duodenum with bleeding: Secondary | ICD-10-CM | POA: Diagnosis not present

## 2020-02-18 DIAGNOSIS — I872 Venous insufficiency (chronic) (peripheral): Secondary | ICD-10-CM | POA: Diagnosis not present

## 2020-02-18 DIAGNOSIS — N189 Chronic kidney disease, unspecified: Secondary | ICD-10-CM | POA: Diagnosis not present

## 2020-02-18 DIAGNOSIS — M810 Age-related osteoporosis without current pathological fracture: Secondary | ICD-10-CM | POA: Diagnosis not present

## 2020-02-18 DIAGNOSIS — Z7902 Long term (current) use of antithrombotics/antiplatelets: Secondary | ICD-10-CM | POA: Diagnosis not present

## 2020-02-18 DIAGNOSIS — Z79891 Long term (current) use of opiate analgesic: Secondary | ICD-10-CM | POA: Diagnosis not present

## 2020-02-18 DIAGNOSIS — Z86011 Personal history of benign neoplasm of the brain: Secondary | ICD-10-CM | POA: Diagnosis not present

## 2020-02-18 DIAGNOSIS — C160 Malignant neoplasm of cardia: Secondary | ICD-10-CM | POA: Diagnosis not present

## 2020-02-18 DIAGNOSIS — Z832 Family history of diseases of the blood and blood-forming organs and certain disorders involving the immune mechanism: Secondary | ICD-10-CM | POA: Diagnosis not present

## 2020-02-18 DIAGNOSIS — N390 Urinary tract infection, site not specified: Secondary | ICD-10-CM | POA: Diagnosis not present

## 2020-02-18 DIAGNOSIS — Z951 Presence of aortocoronary bypass graft: Secondary | ICD-10-CM | POA: Diagnosis not present

## 2020-02-18 DIAGNOSIS — K529 Noninfective gastroenteritis and colitis, unspecified: Secondary | ICD-10-CM | POA: Diagnosis not present

## 2020-02-18 DIAGNOSIS — L89610 Pressure ulcer of right heel, unstageable: Secondary | ICD-10-CM | POA: Diagnosis not present

## 2020-02-18 DIAGNOSIS — J9611 Chronic respiratory failure with hypoxia: Secondary | ICD-10-CM | POA: Diagnosis not present

## 2020-02-18 DIAGNOSIS — R000 Tachycardia, unspecified: Secondary | ICD-10-CM | POA: Diagnosis not present

## 2020-02-18 DIAGNOSIS — H269 Unspecified cataract: Secondary | ICD-10-CM | POA: Diagnosis not present

## 2020-02-18 DIAGNOSIS — D631 Anemia in chronic kidney disease: Secondary | ICD-10-CM | POA: Diagnosis not present

## 2020-02-18 DIAGNOSIS — M65861 Other synovitis and tenosynovitis, right lower leg: Secondary | ICD-10-CM | POA: Diagnosis not present

## 2020-02-18 DIAGNOSIS — D485 Neoplasm of uncertain behavior of skin: Secondary | ICD-10-CM | POA: Diagnosis not present

## 2020-02-18 DIAGNOSIS — N186 End stage renal disease: Secondary | ICD-10-CM | POA: Diagnosis not present

## 2020-02-18 DIAGNOSIS — U071 COVID-19: Secondary | ICD-10-CM | POA: Diagnosis not present

## 2020-02-18 DIAGNOSIS — M5441 Lumbago with sciatica, right side: Secondary | ICD-10-CM | POA: Diagnosis not present

## 2020-02-18 DIAGNOSIS — I779 Disorder of arteries and arterioles, unspecified: Secondary | ICD-10-CM | POA: Diagnosis not present

## 2020-02-18 DIAGNOSIS — S72401G Unspecified fracture of lower end of right femur, subsequent encounter for closed fracture with delayed healing: Secondary | ICD-10-CM | POA: Diagnosis not present

## 2020-02-18 DIAGNOSIS — R791 Abnormal coagulation profile: Secondary | ICD-10-CM | POA: Diagnosis not present

## 2020-02-18 DIAGNOSIS — M62838 Other muscle spasm: Secondary | ICD-10-CM | POA: Diagnosis not present

## 2020-02-18 DIAGNOSIS — K1121 Acute sialoadenitis: Secondary | ICD-10-CM | POA: Diagnosis not present

## 2020-02-18 DIAGNOSIS — E0590 Thyrotoxicosis, unspecified without thyrotoxic crisis or storm: Secondary | ICD-10-CM | POA: Diagnosis not present

## 2020-02-18 DIAGNOSIS — E10649 Type 1 diabetes mellitus with hypoglycemia without coma: Secondary | ICD-10-CM | POA: Diagnosis not present

## 2020-02-18 DIAGNOSIS — M79641 Pain in right hand: Secondary | ICD-10-CM | POA: Diagnosis not present

## 2020-02-18 DIAGNOSIS — Z13228 Encounter for screening for other metabolic disorders: Secondary | ICD-10-CM | POA: Diagnosis not present

## 2020-02-18 DIAGNOSIS — K7469 Other cirrhosis of liver: Secondary | ICD-10-CM | POA: Diagnosis not present

## 2020-02-18 DIAGNOSIS — K8071 Calculus of gallbladder and bile duct without cholecystitis with obstruction: Secondary | ICD-10-CM | POA: Diagnosis not present

## 2020-02-18 DIAGNOSIS — J8410 Pulmonary fibrosis, unspecified: Secondary | ICD-10-CM | POA: Diagnosis not present

## 2020-02-18 DIAGNOSIS — I5043 Acute on chronic combined systolic (congestive) and diastolic (congestive) heart failure: Secondary | ICD-10-CM | POA: Diagnosis not present

## 2020-02-18 DIAGNOSIS — H401132 Primary open-angle glaucoma, bilateral, moderate stage: Secondary | ICD-10-CM | POA: Diagnosis not present

## 2020-02-18 DIAGNOSIS — Z48812 Encounter for surgical aftercare following surgery on the circulatory system: Secondary | ICD-10-CM | POA: Diagnosis not present

## 2020-02-18 DIAGNOSIS — K8050 Calculus of bile duct without cholangitis or cholecystitis without obstruction: Secondary | ICD-10-CM | POA: Diagnosis not present

## 2020-02-18 DIAGNOSIS — R739 Hyperglycemia, unspecified: Secondary | ICD-10-CM | POA: Diagnosis not present

## 2020-02-18 DIAGNOSIS — G3184 Mild cognitive impairment, so stated: Secondary | ICD-10-CM | POA: Diagnosis not present

## 2020-02-18 DIAGNOSIS — D225 Melanocytic nevi of trunk: Secondary | ICD-10-CM | POA: Diagnosis not present

## 2020-02-18 DIAGNOSIS — Z96651 Presence of right artificial knee joint: Secondary | ICD-10-CM | POA: Diagnosis not present

## 2020-02-18 DIAGNOSIS — Z8572 Personal history of non-Hodgkin lymphomas: Secondary | ICD-10-CM | POA: Diagnosis not present

## 2020-02-18 DIAGNOSIS — L03115 Cellulitis of right lower limb: Secondary | ICD-10-CM | POA: Diagnosis not present

## 2020-02-18 DIAGNOSIS — J329 Chronic sinusitis, unspecified: Secondary | ICD-10-CM | POA: Diagnosis not present

## 2020-02-18 DIAGNOSIS — K8020 Calculus of gallbladder without cholecystitis without obstruction: Secondary | ICD-10-CM | POA: Diagnosis not present

## 2020-02-18 DIAGNOSIS — I2111 ST elevation (STEMI) myocardial infarction involving right coronary artery: Secondary | ICD-10-CM | POA: Diagnosis not present

## 2020-02-18 DIAGNOSIS — R601 Generalized edema: Secondary | ICD-10-CM | POA: Diagnosis not present

## 2020-02-18 DIAGNOSIS — K3184 Gastroparesis: Secondary | ICD-10-CM | POA: Diagnosis not present

## 2020-02-18 DIAGNOSIS — Z79890 Hormone replacement therapy (postmenopausal): Secondary | ICD-10-CM | POA: Diagnosis not present

## 2020-02-18 DIAGNOSIS — E871 Hypo-osmolality and hyponatremia: Secondary | ICD-10-CM | POA: Diagnosis not present

## 2020-02-18 DIAGNOSIS — I4819 Other persistent atrial fibrillation: Secondary | ICD-10-CM | POA: Diagnosis not present

## 2020-02-18 DIAGNOSIS — M25569 Pain in unspecified knee: Secondary | ICD-10-CM | POA: Diagnosis not present

## 2020-02-18 DIAGNOSIS — R1011 Right upper quadrant pain: Secondary | ICD-10-CM | POA: Diagnosis not present

## 2020-02-18 DIAGNOSIS — R350 Frequency of micturition: Secondary | ICD-10-CM | POA: Diagnosis not present

## 2020-02-18 DIAGNOSIS — R279 Unspecified lack of coordination: Secondary | ICD-10-CM | POA: Diagnosis not present

## 2020-02-18 DIAGNOSIS — Z96652 Presence of left artificial knee joint: Secondary | ICD-10-CM | POA: Diagnosis not present

## 2020-02-18 DIAGNOSIS — M21619 Bunion of unspecified foot: Secondary | ICD-10-CM | POA: Diagnosis not present

## 2020-02-18 DIAGNOSIS — H539 Unspecified visual disturbance: Secondary | ICD-10-CM | POA: Diagnosis not present

## 2020-02-18 DIAGNOSIS — G959 Disease of spinal cord, unspecified: Secondary | ICD-10-CM | POA: Diagnosis not present

## 2020-02-18 DIAGNOSIS — L89150 Pressure ulcer of sacral region, unstageable: Secondary | ICD-10-CM | POA: Diagnosis not present

## 2020-02-18 DIAGNOSIS — G459 Transient cerebral ischemic attack, unspecified: Secondary | ICD-10-CM | POA: Diagnosis not present

## 2020-02-18 DIAGNOSIS — Z89611 Acquired absence of right leg above knee: Secondary | ICD-10-CM | POA: Diagnosis not present

## 2020-02-18 DIAGNOSIS — R911 Solitary pulmonary nodule: Secondary | ICD-10-CM | POA: Diagnosis not present

## 2020-02-18 DIAGNOSIS — F329 Major depressive disorder, single episode, unspecified: Secondary | ICD-10-CM | POA: Diagnosis not present

## 2020-02-18 DIAGNOSIS — R1013 Epigastric pain: Secondary | ICD-10-CM | POA: Diagnosis not present

## 2020-02-18 DIAGNOSIS — R6520 Severe sepsis without septic shock: Secondary | ICD-10-CM | POA: Diagnosis not present

## 2020-02-18 DIAGNOSIS — E7800 Pure hypercholesterolemia, unspecified: Secondary | ICD-10-CM | POA: Diagnosis not present

## 2020-02-18 DIAGNOSIS — G2581 Restless legs syndrome: Secondary | ICD-10-CM | POA: Diagnosis not present

## 2020-02-18 DIAGNOSIS — E440 Moderate protein-calorie malnutrition: Secondary | ICD-10-CM | POA: Diagnosis not present

## 2020-02-18 DIAGNOSIS — F419 Anxiety disorder, unspecified: Secondary | ICD-10-CM | POA: Diagnosis not present

## 2020-02-18 DIAGNOSIS — Z48813 Encounter for surgical aftercare following surgery on the respiratory system: Secondary | ICD-10-CM | POA: Diagnosis not present

## 2020-02-18 DIAGNOSIS — M7062 Trochanteric bursitis, left hip: Secondary | ICD-10-CM | POA: Diagnosis not present

## 2020-02-18 DIAGNOSIS — M13841 Other specified arthritis, right hand: Secondary | ICD-10-CM | POA: Diagnosis not present

## 2020-02-18 DIAGNOSIS — E039 Hypothyroidism, unspecified: Secondary | ICD-10-CM | POA: Diagnosis not present

## 2020-02-18 DIAGNOSIS — H15009 Unspecified scleritis, unspecified eye: Secondary | ICD-10-CM | POA: Diagnosis not present

## 2020-02-18 DIAGNOSIS — K589 Irritable bowel syndrome without diarrhea: Secondary | ICD-10-CM | POA: Diagnosis not present

## 2020-02-18 DIAGNOSIS — G8929 Other chronic pain: Secondary | ICD-10-CM | POA: Diagnosis not present

## 2020-02-18 DIAGNOSIS — M19049 Primary osteoarthritis, unspecified hand: Secondary | ICD-10-CM | POA: Diagnosis not present

## 2020-02-18 DIAGNOSIS — R52 Pain, unspecified: Secondary | ICD-10-CM | POA: Diagnosis not present

## 2020-02-18 DIAGNOSIS — Z9842 Cataract extraction status, left eye: Secondary | ICD-10-CM | POA: Diagnosis not present

## 2020-02-18 DIAGNOSIS — M4312 Spondylolisthesis, cervical region: Secondary | ICD-10-CM | POA: Diagnosis not present

## 2020-02-18 DIAGNOSIS — R399 Unspecified symptoms and signs involving the genitourinary system: Secondary | ICD-10-CM | POA: Diagnosis not present

## 2020-02-18 DIAGNOSIS — S79911A Unspecified injury of right hip, initial encounter: Secondary | ICD-10-CM | POA: Diagnosis not present

## 2020-02-18 DIAGNOSIS — Z86010 Personal history of colonic polyps: Secondary | ICD-10-CM | POA: Diagnosis not present

## 2020-02-18 DIAGNOSIS — M542 Cervicalgia: Secondary | ICD-10-CM | POA: Diagnosis not present

## 2020-02-18 DIAGNOSIS — Z7983 Long term (current) use of bisphosphonates: Secondary | ICD-10-CM | POA: Diagnosis not present

## 2020-02-18 DIAGNOSIS — C3492 Malignant neoplasm of unspecified part of left bronchus or lung: Secondary | ICD-10-CM | POA: Diagnosis not present

## 2020-02-18 DIAGNOSIS — S86911A Strain of unspecified muscle(s) and tendon(s) at lower leg level, right leg, initial encounter: Secondary | ICD-10-CM | POA: Diagnosis not present

## 2020-02-18 DIAGNOSIS — M9905 Segmental and somatic dysfunction of pelvic region: Secondary | ICD-10-CM | POA: Diagnosis not present

## 2020-02-18 DIAGNOSIS — S12500A Unspecified displaced fracture of sixth cervical vertebra, initial encounter for closed fracture: Secondary | ICD-10-CM | POA: Diagnosis not present

## 2020-02-18 DIAGNOSIS — R262 Difficulty in walking, not elsewhere classified: Secondary | ICD-10-CM | POA: Diagnosis not present

## 2020-02-18 DIAGNOSIS — E519 Thiamine deficiency, unspecified: Secondary | ICD-10-CM | POA: Diagnosis not present

## 2020-02-18 DIAGNOSIS — R55 Syncope and collapse: Secondary | ICD-10-CM | POA: Diagnosis not present

## 2020-02-18 DIAGNOSIS — E034 Atrophy of thyroid (acquired): Secondary | ICD-10-CM | POA: Diagnosis not present

## 2020-02-18 DIAGNOSIS — R1313 Dysphagia, pharyngeal phase: Secondary | ICD-10-CM | POA: Diagnosis not present

## 2020-02-18 DIAGNOSIS — I25118 Atherosclerotic heart disease of native coronary artery with other forms of angina pectoris: Secondary | ICD-10-CM | POA: Diagnosis not present

## 2020-02-18 DIAGNOSIS — H2511 Age-related nuclear cataract, right eye: Secondary | ICD-10-CM | POA: Diagnosis not present

## 2020-02-18 DIAGNOSIS — M6289 Other specified disorders of muscle: Secondary | ICD-10-CM | POA: Diagnosis not present

## 2020-02-18 DIAGNOSIS — K8051 Calculus of bile duct without cholangitis or cholecystitis with obstruction: Secondary | ICD-10-CM | POA: Diagnosis not present

## 2020-02-18 DIAGNOSIS — E1142 Type 2 diabetes mellitus with diabetic polyneuropathy: Secondary | ICD-10-CM | POA: Diagnosis not present

## 2020-02-18 DIAGNOSIS — M79661 Pain in right lower leg: Secondary | ICD-10-CM | POA: Diagnosis not present

## 2020-02-18 DIAGNOSIS — N200 Calculus of kidney: Secondary | ICD-10-CM | POA: Diagnosis not present

## 2020-02-18 DIAGNOSIS — M47816 Spondylosis without myelopathy or radiculopathy, lumbar region: Secondary | ICD-10-CM | POA: Diagnosis not present

## 2020-02-18 DIAGNOSIS — N2581 Secondary hyperparathyroidism of renal origin: Secondary | ICD-10-CM | POA: Diagnosis not present

## 2020-02-18 DIAGNOSIS — Z83438 Family history of other disorder of lipoprotein metabolism and other lipidemia: Secondary | ICD-10-CM | POA: Diagnosis not present

## 2020-02-18 DIAGNOSIS — D120 Benign neoplasm of cecum: Secondary | ICD-10-CM | POA: Diagnosis not present

## 2020-02-18 DIAGNOSIS — J9610 Chronic respiratory failure, unspecified whether with hypoxia or hypercapnia: Secondary | ICD-10-CM | POA: Diagnosis not present

## 2020-02-18 DIAGNOSIS — Z841 Family history of disorders of kidney and ureter: Secondary | ICD-10-CM | POA: Diagnosis not present

## 2020-02-18 DIAGNOSIS — R5382 Chronic fatigue, unspecified: Secondary | ICD-10-CM | POA: Diagnosis not present

## 2020-02-18 DIAGNOSIS — I110 Hypertensive heart disease with heart failure: Secondary | ICD-10-CM | POA: Diagnosis not present

## 2020-02-18 DIAGNOSIS — Z9981 Dependence on supplemental oxygen: Secondary | ICD-10-CM | POA: Diagnosis not present

## 2020-02-18 DIAGNOSIS — N400 Enlarged prostate without lower urinary tract symptoms: Secondary | ICD-10-CM | POA: Diagnosis not present

## 2020-02-18 DIAGNOSIS — Z91048 Other nonmedicinal substance allergy status: Secondary | ICD-10-CM | POA: Diagnosis not present

## 2020-02-18 DIAGNOSIS — B029 Zoster without complications: Secondary | ICD-10-CM | POA: Diagnosis not present

## 2020-02-18 DIAGNOSIS — L03116 Cellulitis of left lower limb: Secondary | ICD-10-CM | POA: Diagnosis not present

## 2020-02-18 DIAGNOSIS — Z23 Encounter for immunization: Secondary | ICD-10-CM | POA: Diagnosis not present

## 2020-02-18 DIAGNOSIS — F418 Other specified anxiety disorders: Secondary | ICD-10-CM | POA: Diagnosis not present

## 2020-02-18 DIAGNOSIS — Z9181 History of falling: Secondary | ICD-10-CM | POA: Diagnosis not present

## 2020-02-18 DIAGNOSIS — M8589 Other specified disorders of bone density and structure, multiple sites: Secondary | ICD-10-CM | POA: Diagnosis not present

## 2020-02-18 DIAGNOSIS — R251 Tremor, unspecified: Secondary | ICD-10-CM | POA: Diagnosis not present

## 2020-02-18 DIAGNOSIS — Z833 Family history of diabetes mellitus: Secondary | ICD-10-CM | POA: Diagnosis not present

## 2020-02-18 DIAGNOSIS — I739 Peripheral vascular disease, unspecified: Secondary | ICD-10-CM | POA: Diagnosis not present

## 2020-02-18 DIAGNOSIS — M25641 Stiffness of right hand, not elsewhere classified: Secondary | ICD-10-CM | POA: Diagnosis not present

## 2020-02-18 DIAGNOSIS — K921 Melena: Secondary | ICD-10-CM | POA: Diagnosis not present

## 2020-02-18 DIAGNOSIS — R0989 Other specified symptoms and signs involving the circulatory and respiratory systems: Secondary | ICD-10-CM | POA: Diagnosis not present

## 2020-02-18 DIAGNOSIS — Z87891 Personal history of nicotine dependence: Secondary | ICD-10-CM | POA: Diagnosis not present

## 2020-02-18 DIAGNOSIS — D1801 Hemangioma of skin and subcutaneous tissue: Secondary | ICD-10-CM | POA: Diagnosis not present

## 2020-02-18 DIAGNOSIS — I255 Ischemic cardiomyopathy: Secondary | ICD-10-CM | POA: Diagnosis not present

## 2020-02-18 DIAGNOSIS — R2689 Other abnormalities of gait and mobility: Secondary | ICD-10-CM | POA: Diagnosis not present

## 2020-02-18 DIAGNOSIS — M1990 Unspecified osteoarthritis, unspecified site: Secondary | ICD-10-CM | POA: Diagnosis not present

## 2020-02-18 DIAGNOSIS — Z9841 Cataract extraction status, right eye: Secondary | ICD-10-CM | POA: Diagnosis not present

## 2020-02-18 DIAGNOSIS — I129 Hypertensive chronic kidney disease with stage 1 through stage 4 chronic kidney disease, or unspecified chronic kidney disease: Secondary | ICD-10-CM | POA: Diagnosis not present

## 2020-02-18 DIAGNOSIS — A419 Sepsis, unspecified organism: Secondary | ICD-10-CM | POA: Diagnosis not present

## 2020-02-18 DIAGNOSIS — N135 Crossing vessel and stricture of ureter without hydronephrosis: Secondary | ICD-10-CM | POA: Diagnosis not present

## 2020-02-18 DIAGNOSIS — M25629 Stiffness of unspecified elbow, not elsewhere classified: Secondary | ICD-10-CM | POA: Diagnosis not present

## 2020-02-18 DIAGNOSIS — Z8041 Family history of malignant neoplasm of ovary: Secondary | ICD-10-CM | POA: Diagnosis not present

## 2020-02-18 DIAGNOSIS — J4520 Mild intermittent asthma, uncomplicated: Secondary | ICD-10-CM | POA: Diagnosis not present

## 2020-02-18 DIAGNOSIS — M25559 Pain in unspecified hip: Secondary | ICD-10-CM | POA: Diagnosis not present

## 2020-02-18 DIAGNOSIS — Z853 Personal history of malignant neoplasm of breast: Secondary | ICD-10-CM | POA: Diagnosis not present

## 2020-02-18 DIAGNOSIS — L89140 Pressure ulcer of left lower back, unstageable: Secondary | ICD-10-CM | POA: Diagnosis not present

## 2020-02-18 DIAGNOSIS — M9901 Segmental and somatic dysfunction of cervical region: Secondary | ICD-10-CM | POA: Diagnosis not present

## 2020-02-18 DIAGNOSIS — K7689 Other specified diseases of liver: Secondary | ICD-10-CM | POA: Diagnosis not present

## 2020-02-18 DIAGNOSIS — M4727 Other spondylosis with radiculopathy, lumbosacral region: Secondary | ICD-10-CM | POA: Diagnosis not present

## 2020-02-18 DIAGNOSIS — H35443 Age-related reticular degeneration of retina, bilateral: Secondary | ICD-10-CM | POA: Diagnosis not present

## 2020-02-18 DIAGNOSIS — I469 Cardiac arrest, cause unspecified: Secondary | ICD-10-CM | POA: Diagnosis not present

## 2020-02-18 DIAGNOSIS — Z0000 Encounter for general adult medical examination without abnormal findings: Secondary | ICD-10-CM | POA: Diagnosis not present

## 2020-02-18 DIAGNOSIS — I371 Nonrheumatic pulmonary valve insufficiency: Secondary | ICD-10-CM | POA: Diagnosis not present

## 2020-02-18 DIAGNOSIS — H547 Unspecified visual loss: Secondary | ICD-10-CM | POA: Diagnosis not present

## 2020-02-18 DIAGNOSIS — H9190 Unspecified hearing loss, unspecified ear: Secondary | ICD-10-CM | POA: Diagnosis not present

## 2020-02-18 DIAGNOSIS — D509 Iron deficiency anemia, unspecified: Secondary | ICD-10-CM | POA: Diagnosis not present

## 2020-02-18 DIAGNOSIS — K592 Neurogenic bowel, not elsewhere classified: Secondary | ICD-10-CM | POA: Diagnosis not present

## 2020-02-18 DIAGNOSIS — E872 Acidosis: Secondary | ICD-10-CM | POA: Diagnosis not present

## 2020-02-18 DIAGNOSIS — M2550 Pain in unspecified joint: Secondary | ICD-10-CM | POA: Diagnosis not present

## 2020-02-18 DIAGNOSIS — I69393 Ataxia following cerebral infarction: Secondary | ICD-10-CM | POA: Diagnosis not present

## 2020-02-18 DIAGNOSIS — Z96612 Presence of left artificial shoulder joint: Secondary | ICD-10-CM | POA: Diagnosis not present

## 2020-02-18 DIAGNOSIS — M5416 Radiculopathy, lumbar region: Secondary | ICD-10-CM | POA: Diagnosis not present

## 2020-02-18 DIAGNOSIS — K769 Liver disease, unspecified: Secondary | ICD-10-CM | POA: Diagnosis not present

## 2020-02-18 DIAGNOSIS — I480 Paroxysmal atrial fibrillation: Secondary | ICD-10-CM | POA: Diagnosis not present

## 2020-02-18 DIAGNOSIS — K766 Portal hypertension: Secondary | ICD-10-CM | POA: Diagnosis not present

## 2020-02-18 DIAGNOSIS — R1110 Vomiting, unspecified: Secondary | ICD-10-CM | POA: Diagnosis not present

## 2020-02-18 DIAGNOSIS — Z209 Contact with and (suspected) exposure to unspecified communicable disease: Secondary | ICD-10-CM | POA: Diagnosis not present

## 2020-02-18 DIAGNOSIS — M79604 Pain in right leg: Secondary | ICD-10-CM | POA: Diagnosis not present

## 2020-02-18 DIAGNOSIS — R7401 Elevation of levels of liver transaminase levels: Secondary | ICD-10-CM | POA: Diagnosis not present

## 2020-02-18 DIAGNOSIS — R410 Disorientation, unspecified: Secondary | ICD-10-CM | POA: Diagnosis not present

## 2020-02-18 DIAGNOSIS — J984 Other disorders of lung: Secondary | ICD-10-CM | POA: Diagnosis not present

## 2020-02-18 DIAGNOSIS — I700 Atherosclerosis of aorta: Secondary | ICD-10-CM | POA: Diagnosis not present

## 2020-02-18 DIAGNOSIS — Z780 Asymptomatic menopausal state: Secondary | ICD-10-CM | POA: Diagnosis not present

## 2020-02-18 DIAGNOSIS — I959 Hypotension, unspecified: Secondary | ICD-10-CM | POA: Diagnosis not present

## 2020-02-18 DIAGNOSIS — I350 Nonrheumatic aortic (valve) stenosis: Secondary | ICD-10-CM | POA: Diagnosis not present

## 2020-02-18 DIAGNOSIS — K8309 Other cholangitis: Secondary | ICD-10-CM | POA: Diagnosis not present

## 2020-02-18 DIAGNOSIS — I252 Old myocardial infarction: Secondary | ICD-10-CM | POA: Diagnosis not present

## 2020-02-18 DIAGNOSIS — L578 Other skin changes due to chronic exposure to nonionizing radiation: Secondary | ICD-10-CM | POA: Diagnosis not present

## 2020-02-18 DIAGNOSIS — Z794 Long term (current) use of insulin: Secondary | ICD-10-CM | POA: Diagnosis not present

## 2020-02-18 DIAGNOSIS — F4310 Post-traumatic stress disorder, unspecified: Secondary | ICD-10-CM | POA: Diagnosis not present

## 2020-02-18 DIAGNOSIS — K7460 Unspecified cirrhosis of liver: Secondary | ICD-10-CM | POA: Diagnosis not present

## 2020-02-18 DIAGNOSIS — H34219 Partial retinal artery occlusion, unspecified eye: Secondary | ICD-10-CM | POA: Diagnosis not present

## 2020-02-18 DIAGNOSIS — I351 Nonrheumatic aortic (valve) insufficiency: Secondary | ICD-10-CM | POA: Diagnosis not present

## 2020-02-18 DIAGNOSIS — G35 Multiple sclerosis: Secondary | ICD-10-CM | POA: Diagnosis not present

## 2020-02-18 DIAGNOSIS — D72829 Elevated white blood cell count, unspecified: Secondary | ICD-10-CM | POA: Diagnosis not present

## 2020-02-18 DIAGNOSIS — Z8546 Personal history of malignant neoplasm of prostate: Secondary | ICD-10-CM | POA: Diagnosis not present

## 2020-02-18 DIAGNOSIS — M2548 Effusion, other site: Secondary | ICD-10-CM | POA: Diagnosis not present

## 2020-02-18 DIAGNOSIS — J9811 Atelectasis: Secondary | ICD-10-CM | POA: Diagnosis not present

## 2020-02-18 DIAGNOSIS — S68621A Partial traumatic transphalangeal amputation of left index finger, initial encounter: Secondary | ICD-10-CM | POA: Diagnosis not present

## 2020-02-18 DIAGNOSIS — J4550 Severe persistent asthma, uncomplicated: Secondary | ICD-10-CM | POA: Diagnosis not present

## 2020-02-18 DIAGNOSIS — M25862 Other specified joint disorders, left knee: Secondary | ICD-10-CM | POA: Diagnosis not present

## 2020-02-18 DIAGNOSIS — R1010 Upper abdominal pain, unspecified: Secondary | ICD-10-CM | POA: Diagnosis not present

## 2020-02-18 DIAGNOSIS — R0600 Dyspnea, unspecified: Secondary | ICD-10-CM | POA: Diagnosis not present

## 2020-02-18 DIAGNOSIS — M545 Low back pain: Secondary | ICD-10-CM | POA: Diagnosis not present

## 2020-02-18 DIAGNOSIS — H34211 Partial retinal artery occlusion, right eye: Secondary | ICD-10-CM | POA: Diagnosis not present

## 2020-02-18 DIAGNOSIS — H6123 Impacted cerumen, bilateral: Secondary | ICD-10-CM | POA: Diagnosis not present

## 2020-02-18 DIAGNOSIS — M25462 Effusion, left knee: Secondary | ICD-10-CM | POA: Diagnosis not present

## 2020-02-18 DIAGNOSIS — I071 Rheumatic tricuspid insufficiency: Secondary | ICD-10-CM | POA: Diagnosis not present

## 2020-02-18 DIAGNOSIS — M1711 Unilateral primary osteoarthritis, right knee: Secondary | ICD-10-CM | POA: Diagnosis not present

## 2020-02-18 DIAGNOSIS — R002 Palpitations: Secondary | ICD-10-CM | POA: Diagnosis not present

## 2020-02-18 DIAGNOSIS — E46 Unspecified protein-calorie malnutrition: Secondary | ICD-10-CM | POA: Diagnosis not present

## 2020-02-18 DIAGNOSIS — E782 Mixed hyperlipidemia: Secondary | ICD-10-CM | POA: Diagnosis not present

## 2020-02-18 DIAGNOSIS — M25551 Pain in right hip: Secondary | ICD-10-CM | POA: Diagnosis not present

## 2020-02-18 DIAGNOSIS — E119 Type 2 diabetes mellitus without complications: Secondary | ICD-10-CM | POA: Diagnosis not present

## 2020-02-18 DIAGNOSIS — M797 Fibromyalgia: Secondary | ICD-10-CM | POA: Diagnosis not present

## 2020-02-18 DIAGNOSIS — C189 Malignant neoplasm of colon, unspecified: Secondary | ICD-10-CM | POA: Diagnosis not present

## 2020-02-18 DIAGNOSIS — R0789 Other chest pain: Secondary | ICD-10-CM | POA: Diagnosis not present

## 2020-02-18 DIAGNOSIS — K6289 Other specified diseases of anus and rectum: Secondary | ICD-10-CM | POA: Diagnosis not present

## 2020-02-18 DIAGNOSIS — I482 Chronic atrial fibrillation, unspecified: Secondary | ICD-10-CM | POA: Diagnosis not present

## 2020-02-18 DIAGNOSIS — Z7984 Long term (current) use of oral hypoglycemic drugs: Secondary | ICD-10-CM | POA: Diagnosis not present

## 2020-02-18 DIAGNOSIS — H3530 Unspecified macular degeneration: Secondary | ICD-10-CM | POA: Diagnosis not present

## 2020-02-18 DIAGNOSIS — K5900 Constipation, unspecified: Secondary | ICD-10-CM | POA: Diagnosis not present

## 2020-02-18 DIAGNOSIS — R6884 Jaw pain: Secondary | ICD-10-CM | POA: Diagnosis not present

## 2020-02-18 DIAGNOSIS — R21 Rash and other nonspecific skin eruption: Secondary | ICD-10-CM | POA: Diagnosis not present

## 2020-02-18 DIAGNOSIS — S065X9D Traumatic subdural hemorrhage with loss of consciousness of unspecified duration, subsequent encounter: Secondary | ICD-10-CM | POA: Diagnosis not present

## 2020-02-18 DIAGNOSIS — S025XXA Fracture of tooth (traumatic), initial encounter for closed fracture: Secondary | ICD-10-CM | POA: Diagnosis not present

## 2020-02-18 DIAGNOSIS — Q2733 Arteriovenous malformation of digestive system vessel: Secondary | ICD-10-CM | POA: Diagnosis not present

## 2020-02-18 DIAGNOSIS — H8113 Benign paroxysmal vertigo, bilateral: Secondary | ICD-10-CM | POA: Diagnosis not present

## 2020-02-18 DIAGNOSIS — S0990XA Unspecified injury of head, initial encounter: Secondary | ICD-10-CM | POA: Diagnosis not present

## 2020-02-18 DIAGNOSIS — J432 Centrilobular emphysema: Secondary | ICD-10-CM | POA: Diagnosis not present

## 2020-02-18 DIAGNOSIS — N3010 Interstitial cystitis (chronic) without hematuria: Secondary | ICD-10-CM | POA: Diagnosis not present

## 2020-02-18 DIAGNOSIS — S43491D Other sprain of right shoulder joint, subsequent encounter: Secondary | ICD-10-CM | POA: Diagnosis not present

## 2020-02-18 DIAGNOSIS — L820 Inflamed seborrheic keratosis: Secondary | ICD-10-CM | POA: Diagnosis not present

## 2020-02-18 DIAGNOSIS — M79652 Pain in left thigh: Secondary | ICD-10-CM | POA: Diagnosis not present

## 2020-02-18 DIAGNOSIS — K5730 Diverticulosis of large intestine without perforation or abscess without bleeding: Secondary | ICD-10-CM | POA: Diagnosis not present

## 2020-02-18 DIAGNOSIS — Z85828 Personal history of other malignant neoplasm of skin: Secondary | ICD-10-CM | POA: Diagnosis not present

## 2020-02-18 DIAGNOSIS — I340 Nonrheumatic mitral (valve) insufficiency: Secondary | ICD-10-CM | POA: Diagnosis not present

## 2020-02-18 DIAGNOSIS — R748 Abnormal levels of other serum enzymes: Secondary | ICD-10-CM | POA: Diagnosis not present

## 2020-02-18 DIAGNOSIS — Z955 Presence of coronary angioplasty implant and graft: Secondary | ICD-10-CM | POA: Diagnosis not present

## 2020-02-18 DIAGNOSIS — C787 Secondary malignant neoplasm of liver and intrahepatic bile duct: Secondary | ICD-10-CM | POA: Diagnosis not present

## 2020-02-18 DIAGNOSIS — R296 Repeated falls: Secondary | ICD-10-CM | POA: Diagnosis not present

## 2020-02-18 DIAGNOSIS — I272 Pulmonary hypertension, unspecified: Secondary | ICD-10-CM | POA: Diagnosis not present

## 2020-02-18 DIAGNOSIS — R42 Dizziness and giddiness: Secondary | ICD-10-CM | POA: Diagnosis not present

## 2020-02-18 DIAGNOSIS — M4316 Spondylolisthesis, lumbar region: Secondary | ICD-10-CM | POA: Diagnosis not present

## 2020-02-18 DIAGNOSIS — M461 Sacroiliitis, not elsewhere classified: Secondary | ICD-10-CM | POA: Diagnosis not present

## 2020-02-18 DIAGNOSIS — M25519 Pain in unspecified shoulder: Secondary | ICD-10-CM | POA: Diagnosis not present

## 2020-02-18 DIAGNOSIS — N3289 Other specified disorders of bladder: Secondary | ICD-10-CM | POA: Diagnosis not present

## 2020-02-18 DIAGNOSIS — G309 Alzheimer's disease, unspecified: Secondary | ICD-10-CM | POA: Diagnosis not present

## 2020-02-18 DIAGNOSIS — Z90710 Acquired absence of both cervix and uterus: Secondary | ICD-10-CM | POA: Diagnosis not present

## 2020-02-18 DIAGNOSIS — Z803 Family history of malignant neoplasm of breast: Secondary | ICD-10-CM | POA: Diagnosis not present

## 2020-02-18 DIAGNOSIS — M25562 Pain in left knee: Secondary | ICD-10-CM | POA: Diagnosis not present

## 2020-02-18 DIAGNOSIS — M8588 Other specified disorders of bone density and structure, other site: Secondary | ICD-10-CM | POA: Diagnosis not present

## 2020-02-18 DIAGNOSIS — K31819 Angiodysplasia of stomach and duodenum without bleeding: Secondary | ICD-10-CM | POA: Diagnosis not present

## 2020-02-18 DIAGNOSIS — H00016 Hordeolum externum left eye, unspecified eyelid: Secondary | ICD-10-CM | POA: Diagnosis not present

## 2020-02-18 DIAGNOSIS — M79644 Pain in right finger(s): Secondary | ICD-10-CM | POA: Diagnosis not present

## 2020-02-18 DIAGNOSIS — R6510 Systemic inflammatory response syndrome (SIRS) of non-infectious origin without acute organ dysfunction: Secondary | ICD-10-CM | POA: Diagnosis not present

## 2020-02-18 DIAGNOSIS — L281 Prurigo nodularis: Secondary | ICD-10-CM | POA: Diagnosis not present

## 2020-02-18 DIAGNOSIS — I5031 Acute diastolic (congestive) heart failure: Secondary | ICD-10-CM | POA: Diagnosis not present

## 2020-02-18 DIAGNOSIS — E538 Deficiency of other specified B group vitamins: Secondary | ICD-10-CM | POA: Diagnosis not present

## 2020-02-18 DIAGNOSIS — J449 Chronic obstructive pulmonary disease, unspecified: Secondary | ICD-10-CM | POA: Diagnosis not present

## 2020-02-18 DIAGNOSIS — E559 Vitamin D deficiency, unspecified: Secondary | ICD-10-CM | POA: Diagnosis not present

## 2020-02-18 DIAGNOSIS — K279 Peptic ulcer, site unspecified, unspecified as acute or chronic, without hemorrhage or perforation: Secondary | ICD-10-CM | POA: Diagnosis not present

## 2020-02-18 DIAGNOSIS — R079 Chest pain, unspecified: Secondary | ICD-10-CM | POA: Diagnosis not present

## 2020-02-18 DIAGNOSIS — I712 Thoracic aortic aneurysm, without rupture: Secondary | ICD-10-CM | POA: Diagnosis not present

## 2020-02-18 DIAGNOSIS — D649 Anemia, unspecified: Secondary | ICD-10-CM | POA: Diagnosis not present

## 2020-02-18 DIAGNOSIS — M5415 Radiculopathy, thoracolumbar region: Secondary | ICD-10-CM | POA: Diagnosis not present

## 2020-02-18 DIAGNOSIS — R109 Unspecified abdominal pain: Secondary | ICD-10-CM | POA: Diagnosis not present

## 2020-02-18 DIAGNOSIS — A809 Acute poliomyelitis, unspecified: Secondary | ICD-10-CM | POA: Diagnosis not present

## 2020-02-18 DIAGNOSIS — F17210 Nicotine dependence, cigarettes, uncomplicated: Secondary | ICD-10-CM | POA: Diagnosis not present

## 2020-02-18 DIAGNOSIS — Z8711 Personal history of peptic ulcer disease: Secondary | ICD-10-CM | POA: Diagnosis not present

## 2020-02-18 DIAGNOSIS — Z7982 Long term (current) use of aspirin: Secondary | ICD-10-CM | POA: Diagnosis not present

## 2020-02-18 DIAGNOSIS — Z961 Presence of intraocular lens: Secondary | ICD-10-CM | POA: Diagnosis not present

## 2020-02-18 DIAGNOSIS — M25552 Pain in left hip: Secondary | ICD-10-CM | POA: Diagnosis not present

## 2020-02-18 DIAGNOSIS — N281 Cyst of kidney, acquired: Secondary | ICD-10-CM | POA: Diagnosis not present

## 2020-02-18 DIAGNOSIS — M792 Neuralgia and neuritis, unspecified: Secondary | ICD-10-CM | POA: Diagnosis not present

## 2020-02-18 DIAGNOSIS — M5412 Radiculopathy, cervical region: Secondary | ICD-10-CM | POA: Diagnosis not present

## 2020-02-18 DIAGNOSIS — Z888 Allergy status to other drugs, medicaments and biological substances status: Secondary | ICD-10-CM | POA: Diagnosis not present

## 2020-02-18 DIAGNOSIS — M5136 Other intervertebral disc degeneration, lumbar region: Secondary | ICD-10-CM | POA: Diagnosis not present

## 2020-02-18 DIAGNOSIS — G8911 Acute pain due to trauma: Secondary | ICD-10-CM | POA: Diagnosis not present

## 2020-02-18 DIAGNOSIS — L97812 Non-pressure chronic ulcer of other part of right lower leg with fat layer exposed: Secondary | ICD-10-CM | POA: Diagnosis not present

## 2020-02-18 DIAGNOSIS — R310 Gross hematuria: Secondary | ICD-10-CM | POA: Diagnosis not present

## 2020-02-18 DIAGNOSIS — R7303 Prediabetes: Secondary | ICD-10-CM | POA: Diagnosis not present

## 2020-02-18 DIAGNOSIS — K551 Chronic vascular disorders of intestine: Secondary | ICD-10-CM | POA: Diagnosis not present

## 2020-02-18 DIAGNOSIS — F319 Bipolar disorder, unspecified: Secondary | ICD-10-CM | POA: Diagnosis not present

## 2020-02-18 DIAGNOSIS — I1310 Hypertensive heart and chronic kidney disease without heart failure, with stage 1 through stage 4 chronic kidney disease, or unspecified chronic kidney disease: Secondary | ICD-10-CM | POA: Diagnosis not present

## 2020-02-18 DIAGNOSIS — K2940 Chronic atrophic gastritis without bleeding: Secondary | ICD-10-CM | POA: Diagnosis not present

## 2020-02-18 DIAGNOSIS — K5790 Diverticulosis of intestine, part unspecified, without perforation or abscess without bleeding: Secondary | ICD-10-CM | POA: Diagnosis not present

## 2020-02-18 DIAGNOSIS — I4821 Permanent atrial fibrillation: Secondary | ICD-10-CM | POA: Diagnosis not present

## 2020-02-18 DIAGNOSIS — S82261A Displaced segmental fracture of shaft of right tibia, initial encounter for closed fracture: Secondary | ICD-10-CM | POA: Diagnosis not present

## 2020-02-18 DIAGNOSIS — N289 Disorder of kidney and ureter, unspecified: Secondary | ICD-10-CM | POA: Diagnosis not present

## 2020-02-18 DIAGNOSIS — S199XXA Unspecified injury of neck, initial encounter: Secondary | ICD-10-CM | POA: Diagnosis not present

## 2020-02-18 DIAGNOSIS — Z01818 Encounter for other preprocedural examination: Secondary | ICD-10-CM | POA: Diagnosis not present

## 2020-02-18 DIAGNOSIS — Z85038 Personal history of other malignant neoplasm of large intestine: Secondary | ICD-10-CM | POA: Diagnosis not present

## 2020-02-18 DIAGNOSIS — C155 Malignant neoplasm of lower third of esophagus: Secondary | ICD-10-CM | POA: Diagnosis not present

## 2020-02-18 DIAGNOSIS — Z452 Encounter for adjustment and management of vascular access device: Secondary | ICD-10-CM | POA: Diagnosis not present

## 2020-02-18 DIAGNOSIS — F331 Major depressive disorder, recurrent, moderate: Secondary | ICD-10-CM | POA: Diagnosis not present

## 2020-02-18 DIAGNOSIS — K838 Other specified diseases of biliary tract: Secondary | ICD-10-CM | POA: Diagnosis not present

## 2020-02-18 DIAGNOSIS — Z820 Family history of epilepsy and other diseases of the nervous system: Secondary | ICD-10-CM | POA: Diagnosis not present

## 2020-02-18 DIAGNOSIS — Z9861 Coronary angioplasty status: Secondary | ICD-10-CM | POA: Diagnosis not present

## 2020-02-18 DIAGNOSIS — D229 Melanocytic nevi, unspecified: Secondary | ICD-10-CM | POA: Diagnosis not present

## 2020-02-18 DIAGNOSIS — Z96641 Presence of right artificial hip joint: Secondary | ICD-10-CM | POA: Diagnosis not present

## 2020-02-18 DIAGNOSIS — S82261N Displaced segmental fracture of shaft of right tibia, subsequent encounter for open fracture type IIIA, IIIB, or IIIC with nonunion: Secondary | ICD-10-CM | POA: Diagnosis not present

## 2020-02-18 DIAGNOSIS — E1151 Type 2 diabetes mellitus with diabetic peripheral angiopathy without gangrene: Secondary | ICD-10-CM | POA: Diagnosis not present

## 2020-02-18 DIAGNOSIS — Z7951 Long term (current) use of inhaled steroids: Secondary | ICD-10-CM | POA: Diagnosis not present

## 2020-02-18 DIAGNOSIS — Z170 Estrogen receptor positive status [ER+]: Secondary | ICD-10-CM | POA: Diagnosis not present

## 2020-02-18 DIAGNOSIS — I341 Nonrheumatic mitral (valve) prolapse: Secondary | ICD-10-CM | POA: Diagnosis not present

## 2020-02-18 DIAGNOSIS — D500 Iron deficiency anemia secondary to blood loss (chronic): Secondary | ICD-10-CM | POA: Diagnosis not present

## 2020-02-18 DIAGNOSIS — M25561 Pain in right knee: Secondary | ICD-10-CM | POA: Diagnosis not present

## 2020-02-18 DIAGNOSIS — Z8679 Personal history of other diseases of the circulatory system: Secondary | ICD-10-CM | POA: Diagnosis not present

## 2020-02-18 DIAGNOSIS — Z8249 Family history of ischemic heart disease and other diseases of the circulatory system: Secondary | ICD-10-CM | POA: Diagnosis not present

## 2020-02-18 DIAGNOSIS — R3914 Feeling of incomplete bladder emptying: Secondary | ICD-10-CM | POA: Diagnosis not present

## 2020-02-18 DIAGNOSIS — E1165 Type 2 diabetes mellitus with hyperglycemia: Secondary | ICD-10-CM | POA: Diagnosis not present

## 2020-02-18 DIAGNOSIS — E1122 Type 2 diabetes mellitus with diabetic chronic kidney disease: Secondary | ICD-10-CM | POA: Diagnosis not present

## 2020-02-18 DIAGNOSIS — I051 Rheumatic mitral insufficiency: Secondary | ICD-10-CM | POA: Diagnosis not present

## 2020-02-18 DIAGNOSIS — R319 Hematuria, unspecified: Secondary | ICD-10-CM | POA: Diagnosis not present

## 2020-02-18 DIAGNOSIS — Z419 Encounter for procedure for purposes other than remedying health state, unspecified: Secondary | ICD-10-CM | POA: Diagnosis not present

## 2020-02-18 DIAGNOSIS — I25708 Atherosclerosis of coronary artery bypass graft(s), unspecified, with other forms of angina pectoris: Secondary | ICD-10-CM | POA: Diagnosis not present

## 2020-02-18 DIAGNOSIS — I208 Other forms of angina pectoris: Secondary | ICD-10-CM | POA: Diagnosis not present

## 2020-02-18 DIAGNOSIS — M80052D Age-related osteoporosis with current pathological fracture, left femur, subsequent encounter for fracture with routine healing: Secondary | ICD-10-CM | POA: Diagnosis not present

## 2020-02-18 DIAGNOSIS — G43719 Chronic migraine without aura, intractable, without status migrainosus: Secondary | ICD-10-CM | POA: Diagnosis not present

## 2020-02-18 DIAGNOSIS — I130 Hypertensive heart and chronic kidney disease with heart failure and stage 1 through stage 4 chronic kidney disease, or unspecified chronic kidney disease: Secondary | ICD-10-CM | POA: Diagnosis not present

## 2020-02-18 DIAGNOSIS — M5030 Other cervical disc degeneration, unspecified cervical region: Secondary | ICD-10-CM | POA: Diagnosis not present

## 2020-02-18 DIAGNOSIS — G43909 Migraine, unspecified, not intractable, without status migrainosus: Secondary | ICD-10-CM | POA: Diagnosis not present

## 2020-02-18 DIAGNOSIS — C7989 Secondary malignant neoplasm of other specified sites: Secondary | ICD-10-CM | POA: Diagnosis not present

## 2020-02-18 DIAGNOSIS — R300 Dysuria: Secondary | ICD-10-CM | POA: Diagnosis not present

## 2020-02-18 DIAGNOSIS — F13239 Sedative, hypnotic or anxiolytic dependence with withdrawal, unspecified: Secondary | ICD-10-CM | POA: Diagnosis not present

## 2020-02-18 DIAGNOSIS — S39012A Strain of muscle, fascia and tendon of lower back, initial encounter: Secondary | ICD-10-CM | POA: Diagnosis not present

## 2020-02-18 DIAGNOSIS — M0579 Rheumatoid arthritis with rheumatoid factor of multiple sites without organ or systems involvement: Secondary | ICD-10-CM | POA: Diagnosis not present

## 2020-02-18 DIAGNOSIS — I447 Left bundle-branch block, unspecified: Secondary | ICD-10-CM | POA: Diagnosis not present

## 2020-02-18 DIAGNOSIS — E1169 Type 2 diabetes mellitus with other specified complication: Secondary | ICD-10-CM | POA: Diagnosis not present

## 2020-02-18 DIAGNOSIS — Z885 Allergy status to narcotic agent status: Secondary | ICD-10-CM | POA: Diagnosis not present

## 2020-02-18 DIAGNOSIS — K219 Gastro-esophageal reflux disease without esophagitis: Secondary | ICD-10-CM | POA: Diagnosis not present

## 2020-02-18 DIAGNOSIS — Z993 Dependence on wheelchair: Secondary | ICD-10-CM | POA: Diagnosis not present

## 2020-02-18 DIAGNOSIS — I5033 Acute on chronic diastolic (congestive) heart failure: Secondary | ICD-10-CM | POA: Diagnosis not present

## 2020-02-18 DIAGNOSIS — I5022 Chronic systolic (congestive) heart failure: Secondary | ICD-10-CM | POA: Diagnosis not present

## 2020-02-18 DIAGNOSIS — E8809 Other disorders of plasma-protein metabolism, not elsewhere classified: Secondary | ICD-10-CM | POA: Diagnosis not present

## 2020-02-18 DIAGNOSIS — R293 Abnormal posture: Secondary | ICD-10-CM | POA: Diagnosis not present

## 2020-02-18 DIAGNOSIS — Z8542 Personal history of malignant neoplasm of other parts of uterus: Secondary | ICD-10-CM | POA: Diagnosis not present

## 2020-02-18 DIAGNOSIS — J339 Nasal polyp, unspecified: Secondary | ICD-10-CM | POA: Diagnosis not present

## 2020-02-18 DIAGNOSIS — R011 Cardiac murmur, unspecified: Secondary | ICD-10-CM | POA: Diagnosis not present

## 2020-02-18 DIAGNOSIS — M9904 Segmental and somatic dysfunction of sacral region: Secondary | ICD-10-CM | POA: Diagnosis not present

## 2020-02-18 DIAGNOSIS — R0602 Shortness of breath: Secondary | ICD-10-CM | POA: Diagnosis not present

## 2020-02-18 DIAGNOSIS — I10 Essential (primary) hypertension: Secondary | ICD-10-CM | POA: Diagnosis not present

## 2020-02-18 DIAGNOSIS — M4802 Spinal stenosis, cervical region: Secondary | ICD-10-CM | POA: Diagnosis not present

## 2020-02-18 DIAGNOSIS — E1121 Type 2 diabetes mellitus with diabetic nephropathy: Secondary | ICD-10-CM | POA: Diagnosis not present

## 2020-02-18 DIAGNOSIS — Z8551 Personal history of malignant neoplasm of bladder: Secondary | ICD-10-CM | POA: Diagnosis not present

## 2020-02-18 DIAGNOSIS — Z8719 Personal history of other diseases of the digestive system: Secondary | ICD-10-CM | POA: Diagnosis not present

## 2020-02-18 DIAGNOSIS — J811 Chronic pulmonary edema: Secondary | ICD-10-CM | POA: Diagnosis not present

## 2020-02-18 DIAGNOSIS — L570 Actinic keratosis: Secondary | ICD-10-CM | POA: Diagnosis not present

## 2020-02-18 DIAGNOSIS — I2510 Atherosclerotic heart disease of native coronary artery without angina pectoris: Secondary | ICD-10-CM | POA: Diagnosis not present

## 2020-02-18 DIAGNOSIS — H538 Other visual disturbances: Secondary | ICD-10-CM | POA: Diagnosis not present

## 2020-02-18 DIAGNOSIS — R29700 NIHSS score 0: Secondary | ICD-10-CM | POA: Diagnosis not present

## 2020-02-18 DIAGNOSIS — S81801A Unspecified open wound, right lower leg, initial encounter: Secondary | ICD-10-CM | POA: Diagnosis not present

## 2020-02-18 DIAGNOSIS — H40033 Anatomical narrow angle, bilateral: Secondary | ICD-10-CM | POA: Diagnosis not present

## 2020-02-18 DIAGNOSIS — K811 Chronic cholecystitis: Secondary | ICD-10-CM | POA: Diagnosis not present

## 2020-02-18 DIAGNOSIS — J309 Allergic rhinitis, unspecified: Secondary | ICD-10-CM | POA: Diagnosis not present

## 2020-02-18 DIAGNOSIS — H903 Sensorineural hearing loss, bilateral: Secondary | ICD-10-CM | POA: Diagnosis not present

## 2020-02-18 DIAGNOSIS — S3991XA Unspecified injury of abdomen, initial encounter: Secondary | ICD-10-CM | POA: Diagnosis not present

## 2020-02-18 DIAGNOSIS — C772 Secondary and unspecified malignant neoplasm of intra-abdominal lymph nodes: Secondary | ICD-10-CM | POA: Diagnosis not present

## 2020-02-18 DIAGNOSIS — M47814 Spondylosis without myelopathy or radiculopathy, thoracic region: Secondary | ICD-10-CM | POA: Diagnosis not present

## 2020-02-18 DIAGNOSIS — Z66 Do not resuscitate: Secondary | ICD-10-CM | POA: Diagnosis not present

## 2020-02-18 DIAGNOSIS — R062 Wheezing: Secondary | ICD-10-CM | POA: Diagnosis not present

## 2020-02-18 DIAGNOSIS — W57XXXA Bitten or stung by nonvenomous insect and other nonvenomous arthropods, initial encounter: Secondary | ICD-10-CM | POA: Diagnosis not present

## 2020-02-18 DIAGNOSIS — D0511 Intraductal carcinoma in situ of right breast: Secondary | ICD-10-CM | POA: Diagnosis not present

## 2020-02-18 DIAGNOSIS — M6281 Muscle weakness (generalized): Secondary | ICD-10-CM | POA: Diagnosis not present

## 2020-02-18 DIAGNOSIS — M25461 Effusion, right knee: Secondary | ICD-10-CM | POA: Diagnosis not present

## 2020-02-18 DIAGNOSIS — M1712 Unilateral primary osteoarthritis, left knee: Secondary | ICD-10-CM | POA: Diagnosis not present

## 2020-02-18 DIAGNOSIS — Z87440 Personal history of urinary (tract) infections: Secondary | ICD-10-CM | POA: Diagnosis not present

## 2020-02-18 DIAGNOSIS — Q211 Atrial septal defect: Secondary | ICD-10-CM | POA: Diagnosis not present

## 2020-02-18 DIAGNOSIS — Z8616 Personal history of COVID-19: Secondary | ICD-10-CM | POA: Diagnosis not present

## 2020-02-18 DIAGNOSIS — J440 Chronic obstructive pulmonary disease with acute lower respiratory infection: Secondary | ICD-10-CM | POA: Diagnosis not present

## 2020-02-18 DIAGNOSIS — C61 Malignant neoplasm of prostate: Secondary | ICD-10-CM | POA: Diagnosis not present

## 2020-02-18 DIAGNOSIS — Z7901 Long term (current) use of anticoagulants: Secondary | ICD-10-CM | POA: Diagnosis not present

## 2020-02-18 DIAGNOSIS — I87313 Chronic venous hypertension (idiopathic) with ulcer of bilateral lower extremity: Secondary | ICD-10-CM | POA: Diagnosis not present

## 2020-02-18 DIAGNOSIS — M25512 Pain in left shoulder: Secondary | ICD-10-CM | POA: Diagnosis not present

## 2020-02-18 DIAGNOSIS — Z8042 Family history of malignant neoplasm of prostate: Secondary | ICD-10-CM | POA: Diagnosis not present

## 2020-02-18 DIAGNOSIS — Z6828 Body mass index (BMI) 28.0-28.9, adult: Secondary | ICD-10-CM | POA: Diagnosis not present

## 2020-02-18 DIAGNOSIS — I5032 Chronic diastolic (congestive) heart failure: Secondary | ICD-10-CM | POA: Diagnosis not present

## 2020-02-18 DIAGNOSIS — S01311A Laceration without foreign body of right ear, initial encounter: Secondary | ICD-10-CM | POA: Diagnosis not present

## 2020-02-18 DIAGNOSIS — J45909 Unspecified asthma, uncomplicated: Secondary | ICD-10-CM | POA: Diagnosis not present

## 2020-02-18 DIAGNOSIS — M5010 Cervical disc disorder with radiculopathy, unspecified cervical region: Secondary | ICD-10-CM | POA: Diagnosis not present

## 2020-02-18 DIAGNOSIS — E890 Postprocedural hypothyroidism: Secondary | ICD-10-CM | POA: Diagnosis not present

## 2020-02-18 DIAGNOSIS — J439 Emphysema, unspecified: Secondary | ICD-10-CM | POA: Diagnosis not present

## 2020-02-18 DIAGNOSIS — S83231A Complex tear of medial meniscus, current injury, right knee, initial encounter: Secondary | ICD-10-CM | POA: Diagnosis not present

## 2020-02-18 DIAGNOSIS — M109 Gout, unspecified: Secondary | ICD-10-CM | POA: Diagnosis not present

## 2020-02-18 DIAGNOSIS — G43009 Migraine without aura, not intractable, without status migrainosus: Secondary | ICD-10-CM | POA: Diagnosis not present

## 2020-02-18 DIAGNOSIS — G4733 Obstructive sleep apnea (adult) (pediatric): Secondary | ICD-10-CM | POA: Diagnosis not present

## 2020-02-18 DIAGNOSIS — K8070 Calculus of gallbladder and bile duct without cholecystitis without obstruction: Secondary | ICD-10-CM | POA: Diagnosis not present

## 2020-02-18 DIAGNOSIS — R1084 Generalized abdominal pain: Secondary | ICD-10-CM | POA: Diagnosis not present

## 2020-02-18 DIAGNOSIS — N319 Neuromuscular dysfunction of bladder, unspecified: Secondary | ICD-10-CM | POA: Diagnosis not present

## 2020-02-18 DIAGNOSIS — J986 Disorders of diaphragm: Secondary | ICD-10-CM | POA: Diagnosis not present

## 2020-02-18 DIAGNOSIS — R072 Precordial pain: Secondary | ICD-10-CM | POA: Diagnosis not present

## 2020-02-18 DIAGNOSIS — R600 Localized edema: Secondary | ICD-10-CM | POA: Diagnosis not present

## 2020-02-18 DIAGNOSIS — Z124 Encounter for screening for malignant neoplasm of cervix: Secondary | ICD-10-CM | POA: Diagnosis not present

## 2020-02-18 DIAGNOSIS — Z886 Allergy status to analgesic agent status: Secondary | ICD-10-CM | POA: Diagnosis not present

## 2020-02-18 DIAGNOSIS — G894 Chronic pain syndrome: Secondary | ICD-10-CM | POA: Diagnosis not present

## 2020-02-18 DIAGNOSIS — F411 Generalized anxiety disorder: Secondary | ICD-10-CM | POA: Diagnosis not present

## 2020-02-18 DIAGNOSIS — N1831 Chronic kidney disease, stage 3a: Secondary | ICD-10-CM | POA: Diagnosis not present

## 2020-02-18 DIAGNOSIS — J301 Allergic rhinitis due to pollen: Secondary | ICD-10-CM | POA: Diagnosis not present

## 2020-02-18 DIAGNOSIS — I471 Supraventricular tachycardia: Secondary | ICD-10-CM | POA: Diagnosis not present

## 2020-02-18 DIAGNOSIS — M4726 Other spondylosis with radiculopathy, lumbar region: Secondary | ICD-10-CM | POA: Diagnosis not present

## 2020-02-18 DIAGNOSIS — L97822 Non-pressure chronic ulcer of other part of left lower leg with fat layer exposed: Secondary | ICD-10-CM | POA: Diagnosis not present

## 2020-02-18 DIAGNOSIS — L89312 Pressure ulcer of right buttock, stage 2: Secondary | ICD-10-CM | POA: Diagnosis not present

## 2020-02-18 DIAGNOSIS — F0390 Unspecified dementia without behavioral disturbance: Secondary | ICD-10-CM | POA: Diagnosis not present

## 2020-02-18 DIAGNOSIS — F4320 Adjustment disorder, unspecified: Secondary | ICD-10-CM | POA: Diagnosis not present

## 2020-02-18 DIAGNOSIS — D692 Other nonthrombocytopenic purpura: Secondary | ICD-10-CM | POA: Diagnosis not present

## 2020-02-18 DIAGNOSIS — N183 Chronic kidney disease, stage 3 unspecified: Secondary | ICD-10-CM | POA: Diagnosis not present

## 2020-02-18 DIAGNOSIS — D123 Benign neoplasm of transverse colon: Secondary | ICD-10-CM | POA: Diagnosis not present

## 2020-02-18 DIAGNOSIS — Z433 Encounter for attention to colostomy: Secondary | ICD-10-CM | POA: Diagnosis not present

## 2020-02-18 DIAGNOSIS — F330 Major depressive disorder, recurrent, mild: Secondary | ICD-10-CM | POA: Diagnosis not present

## 2020-02-18 DIAGNOSIS — K859 Acute pancreatitis without necrosis or infection, unspecified: Secondary | ICD-10-CM | POA: Diagnosis not present

## 2020-02-18 DIAGNOSIS — R778 Other specified abnormalities of plasma proteins: Secondary | ICD-10-CM | POA: Diagnosis not present

## 2020-02-18 DIAGNOSIS — C4442 Squamous cell carcinoma of skin of scalp and neck: Secondary | ICD-10-CM | POA: Diagnosis not present

## 2020-02-18 DIAGNOSIS — M2560 Stiffness of unspecified joint, not elsewhere classified: Secondary | ICD-10-CM | POA: Diagnosis not present

## 2020-02-18 DIAGNOSIS — D492 Neoplasm of unspecified behavior of bone, soft tissue, and skin: Secondary | ICD-10-CM | POA: Diagnosis not present

## 2020-02-18 DIAGNOSIS — N179 Acute kidney failure, unspecified: Secondary | ICD-10-CM | POA: Diagnosis not present

## 2020-02-18 DIAGNOSIS — R1319 Other dysphagia: Secondary | ICD-10-CM | POA: Diagnosis not present

## 2020-02-18 DIAGNOSIS — Z136 Encounter for screening for cardiovascular disorders: Secondary | ICD-10-CM | POA: Diagnosis not present

## 2020-02-18 DIAGNOSIS — M79642 Pain in left hand: Secondary | ICD-10-CM | POA: Diagnosis not present

## 2020-02-18 DIAGNOSIS — E43 Unspecified severe protein-calorie malnutrition: Secondary | ICD-10-CM | POA: Diagnosis not present

## 2020-02-18 DIAGNOSIS — I420 Dilated cardiomyopathy: Secondary | ICD-10-CM | POA: Diagnosis not present

## 2020-02-18 DIAGNOSIS — L905 Scar conditions and fibrosis of skin: Secondary | ICD-10-CM | POA: Diagnosis not present

## 2020-02-18 DIAGNOSIS — R8271 Bacteriuria: Secondary | ICD-10-CM | POA: Diagnosis not present

## 2020-02-18 DIAGNOSIS — M62830 Muscle spasm of back: Secondary | ICD-10-CM | POA: Diagnosis not present

## 2020-02-18 DIAGNOSIS — Z95810 Presence of automatic (implantable) cardiac defibrillator: Secondary | ICD-10-CM | POA: Diagnosis not present

## 2020-02-18 DIAGNOSIS — H2513 Age-related nuclear cataract, bilateral: Secondary | ICD-10-CM | POA: Diagnosis not present

## 2020-02-18 DIAGNOSIS — M47812 Spondylosis without myelopathy or radiculopathy, cervical region: Secondary | ICD-10-CM | POA: Diagnosis not present

## 2020-02-18 DIAGNOSIS — Z20822 Contact with and (suspected) exposure to covid-19: Secondary | ICD-10-CM | POA: Diagnosis not present

## 2020-02-18 DIAGNOSIS — M25612 Stiffness of left shoulder, not elsewhere classified: Secondary | ICD-10-CM | POA: Diagnosis not present

## 2020-02-18 DIAGNOSIS — H26492 Other secondary cataract, left eye: Secondary | ICD-10-CM | POA: Diagnosis not present

## 2020-02-18 DIAGNOSIS — C50911 Malignant neoplasm of unspecified site of right female breast: Secondary | ICD-10-CM | POA: Diagnosis not present

## 2020-02-18 DIAGNOSIS — N3000 Acute cystitis without hematuria: Secondary | ICD-10-CM | POA: Diagnosis not present

## 2020-02-18 DIAGNOSIS — Z79899 Other long term (current) drug therapy: Secondary | ICD-10-CM | POA: Diagnosis not present

## 2020-02-18 DIAGNOSIS — F0280 Dementia in other diseases classified elsewhere without behavioral disturbance: Secondary | ICD-10-CM | POA: Diagnosis not present

## 2020-02-18 DIAGNOSIS — Z9221 Personal history of antineoplastic chemotherapy: Secondary | ICD-10-CM | POA: Diagnosis not present

## 2020-02-18 DIAGNOSIS — K819 Cholecystitis, unspecified: Secondary | ICD-10-CM | POA: Diagnosis not present

## 2020-02-18 DIAGNOSIS — S30861A Insect bite (nonvenomous) of abdominal wall, initial encounter: Secondary | ICD-10-CM | POA: Diagnosis not present

## 2020-02-18 DIAGNOSIS — M7989 Other specified soft tissue disorders: Secondary | ICD-10-CM | POA: Diagnosis not present

## 2020-02-18 DIAGNOSIS — M9903 Segmental and somatic dysfunction of lumbar region: Secondary | ICD-10-CM | POA: Diagnosis not present

## 2020-02-18 DIAGNOSIS — I4891 Unspecified atrial fibrillation: Secondary | ICD-10-CM | POA: Diagnosis not present

## 2020-02-18 DIAGNOSIS — E860 Dehydration: Secondary | ICD-10-CM | POA: Diagnosis not present

## 2020-02-18 DIAGNOSIS — Z125 Encounter for screening for malignant neoplasm of prostate: Secondary | ICD-10-CM | POA: Diagnosis not present

## 2020-02-18 DIAGNOSIS — R5381 Other malaise: Secondary | ICD-10-CM | POA: Diagnosis not present

## 2020-02-18 DIAGNOSIS — R5383 Other fatigue: Secondary | ICD-10-CM | POA: Diagnosis not present

## 2020-02-18 DIAGNOSIS — J302 Other seasonal allergic rhinitis: Secondary | ICD-10-CM | POA: Diagnosis not present

## 2020-02-18 DIAGNOSIS — W0110XA Fall on same level from slipping, tripping and stumbling with subsequent striking against unspecified object, initial encounter: Secondary | ICD-10-CM | POA: Diagnosis not present

## 2020-02-18 DIAGNOSIS — R197 Diarrhea, unspecified: Secondary | ICD-10-CM | POA: Diagnosis not present

## 2020-02-18 DIAGNOSIS — Z471 Aftercare following joint replacement surgery: Secondary | ICD-10-CM | POA: Diagnosis not present

## 2020-02-18 DIAGNOSIS — R10822 Left upper quadrant rebound abdominal tenderness: Secondary | ICD-10-CM | POA: Diagnosis not present

## 2020-02-18 DIAGNOSIS — R0902 Hypoxemia: Secondary | ICD-10-CM | POA: Diagnosis not present

## 2020-02-18 DIAGNOSIS — C50811 Malignant neoplasm of overlapping sites of right female breast: Secondary | ICD-10-CM | POA: Diagnosis not present

## 2020-02-18 DIAGNOSIS — R2681 Unsteadiness on feet: Secondary | ICD-10-CM | POA: Diagnosis not present

## 2020-02-18 DIAGNOSIS — M549 Dorsalgia, unspecified: Secondary | ICD-10-CM | POA: Diagnosis not present

## 2020-02-18 DIAGNOSIS — R32 Unspecified urinary incontinence: Secondary | ICD-10-CM | POA: Diagnosis not present

## 2020-02-18 DIAGNOSIS — I951 Orthostatic hypotension: Secondary | ICD-10-CM | POA: Diagnosis not present

## 2020-02-18 DIAGNOSIS — K760 Fatty (change of) liver, not elsewhere classified: Secondary | ICD-10-CM | POA: Diagnosis not present

## 2020-02-18 DIAGNOSIS — Z720 Tobacco use: Secondary | ICD-10-CM | POA: Diagnosis not present

## 2020-02-18 DIAGNOSIS — N50812 Left testicular pain: Secondary | ICD-10-CM | POA: Diagnosis not present

## 2020-02-18 DIAGNOSIS — I119 Hypertensive heart disease without heart failure: Secondary | ICD-10-CM | POA: Diagnosis not present

## 2020-02-18 DIAGNOSIS — M8580 Other specified disorders of bone density and structure, unspecified site: Secondary | ICD-10-CM | POA: Diagnosis not present

## 2020-02-18 DIAGNOSIS — M7918 Myalgia, other site: Secondary | ICD-10-CM | POA: Diagnosis not present

## 2020-02-18 DIAGNOSIS — M19011 Primary osteoarthritis, right shoulder: Secondary | ICD-10-CM | POA: Diagnosis not present

## 2020-02-18 DIAGNOSIS — G44229 Chronic tension-type headache, not intractable: Secondary | ICD-10-CM | POA: Diagnosis not present

## 2020-02-18 DIAGNOSIS — J441 Chronic obstructive pulmonary disease with (acute) exacerbation: Secondary | ICD-10-CM | POA: Diagnosis not present

## 2020-02-18 DIAGNOSIS — Z800 Family history of malignant neoplasm of digestive organs: Secondary | ICD-10-CM | POA: Diagnosis not present

## 2020-02-18 DIAGNOSIS — Z85820 Personal history of malignant melanoma of skin: Secondary | ICD-10-CM | POA: Diagnosis not present

## 2020-02-18 DIAGNOSIS — I509 Heart failure, unspecified: Secondary | ICD-10-CM | POA: Diagnosis not present

## 2020-02-18 DIAGNOSIS — C801 Malignant (primary) neoplasm, unspecified: Secondary | ICD-10-CM | POA: Diagnosis not present

## 2020-02-18 DIAGNOSIS — D519 Vitamin B12 deficiency anemia, unspecified: Secondary | ICD-10-CM | POA: Diagnosis not present

## 2020-02-18 DIAGNOSIS — Z1231 Encounter for screening mammogram for malignant neoplasm of breast: Secondary | ICD-10-CM | POA: Diagnosis not present

## 2020-02-18 DIAGNOSIS — R519 Headache, unspecified: Secondary | ICD-10-CM | POA: Diagnosis not present

## 2020-02-18 DIAGNOSIS — M47817 Spondylosis without myelopathy or radiculopathy, lumbosacral region: Secondary | ICD-10-CM | POA: Diagnosis not present

## 2020-02-18 DIAGNOSIS — Z9889 Other specified postprocedural states: Secondary | ICD-10-CM | POA: Diagnosis not present

## 2020-02-18 DIAGNOSIS — R7881 Bacteremia: Secondary | ICD-10-CM | POA: Diagnosis not present

## 2020-02-18 DIAGNOSIS — K224 Dyskinesia of esophagus: Secondary | ICD-10-CM | POA: Diagnosis not present

## 2020-02-18 DIAGNOSIS — M25511 Pain in right shoulder: Secondary | ICD-10-CM | POA: Diagnosis not present

## 2020-02-18 DIAGNOSIS — W19XXXA Unspecified fall, initial encounter: Secondary | ICD-10-CM | POA: Diagnosis not present

## 2020-02-18 DIAGNOSIS — E079 Disorder of thyroid, unspecified: Secondary | ICD-10-CM | POA: Diagnosis not present

## 2020-02-18 DIAGNOSIS — Z01812 Encounter for preprocedural laboratory examination: Secondary | ICD-10-CM | POA: Diagnosis not present

## 2020-02-18 DIAGNOSIS — B951 Streptococcus, group B, as the cause of diseases classified elsewhere: Secondary | ICD-10-CM | POA: Diagnosis not present

## 2020-02-18 DIAGNOSIS — S83271A Complex tear of lateral meniscus, current injury, right knee, initial encounter: Secondary | ICD-10-CM | POA: Diagnosis not present

## 2020-02-18 DIAGNOSIS — M5489 Other dorsalgia: Secondary | ICD-10-CM | POA: Diagnosis not present

## 2020-02-18 DIAGNOSIS — Z96643 Presence of artificial hip joint, bilateral: Secondary | ICD-10-CM | POA: Diagnosis not present

## 2020-02-18 DIAGNOSIS — I773 Arterial fibromuscular dysplasia: Secondary | ICD-10-CM | POA: Diagnosis not present

## 2020-02-18 DIAGNOSIS — Z5320 Procedure and treatment not carried out because of patient's decision for unspecified reasons: Secondary | ICD-10-CM | POA: Diagnosis not present

## 2020-02-18 DIAGNOSIS — M546 Pain in thoracic spine: Secondary | ICD-10-CM | POA: Diagnosis not present

## 2020-02-18 DIAGNOSIS — A401 Sepsis due to streptococcus, group B: Secondary | ICD-10-CM | POA: Diagnosis not present

## 2020-02-18 DIAGNOSIS — G4700 Insomnia, unspecified: Secondary | ICD-10-CM | POA: Diagnosis not present

## 2020-02-18 DIAGNOSIS — E785 Hyperlipidemia, unspecified: Secondary | ICD-10-CM | POA: Diagnosis not present

## 2020-02-18 DIAGNOSIS — M48061 Spinal stenosis, lumbar region without neurogenic claudication: Secondary | ICD-10-CM | POA: Diagnosis not present

## 2020-02-18 DIAGNOSIS — R6521 Severe sepsis with septic shock: Secondary | ICD-10-CM | POA: Diagnosis not present

## 2020-02-18 DIAGNOSIS — I491 Atrial premature depolarization: Secondary | ICD-10-CM | POA: Diagnosis not present

## 2020-02-18 DIAGNOSIS — G250 Essential tremor: Secondary | ICD-10-CM | POA: Diagnosis not present

## 2020-02-18 DIAGNOSIS — S32030A Wedge compression fracture of third lumbar vertebra, initial encounter for closed fracture: Secondary | ICD-10-CM | POA: Diagnosis not present

## 2020-02-18 DIAGNOSIS — I6502 Occlusion and stenosis of left vertebral artery: Secondary | ICD-10-CM | POA: Diagnosis not present

## 2020-02-18 DIAGNOSIS — L821 Other seborrheic keratosis: Secondary | ICD-10-CM | POA: Diagnosis not present

## 2020-02-18 DIAGNOSIS — N5082 Scrotal pain: Secondary | ICD-10-CM | POA: Diagnosis not present

## 2020-02-18 DIAGNOSIS — M4317 Spondylolisthesis, lumbosacral region: Secondary | ICD-10-CM | POA: Diagnosis not present

## 2020-02-18 DIAGNOSIS — I4510 Unspecified right bundle-branch block: Secondary | ICD-10-CM | POA: Diagnosis not present

## 2020-02-18 DIAGNOSIS — W19XXXD Unspecified fall, subsequent encounter: Secondary | ICD-10-CM | POA: Diagnosis not present

## 2020-02-18 DIAGNOSIS — K2270 Barrett's esophagus without dysplasia: Secondary | ICD-10-CM | POA: Diagnosis not present

## 2020-02-18 DIAGNOSIS — M9902 Segmental and somatic dysfunction of thoracic region: Secondary | ICD-10-CM | POA: Diagnosis not present

## 2020-02-18 DIAGNOSIS — S78111D Complete traumatic amputation at level between right hip and knee, subsequent encounter: Secondary | ICD-10-CM | POA: Diagnosis not present

## 2020-02-18 DIAGNOSIS — Z881 Allergy status to other antibiotic agents status: Secondary | ICD-10-CM | POA: Diagnosis not present

## 2020-02-19 DIAGNOSIS — J309 Allergic rhinitis, unspecified: Secondary | ICD-10-CM | POA: Diagnosis not present

## 2020-02-19 DIAGNOSIS — I4510 Unspecified right bundle-branch block: Secondary | ICD-10-CM | POA: Diagnosis not present

## 2020-02-19 DIAGNOSIS — E538 Deficiency of other specified B group vitamins: Secondary | ICD-10-CM | POA: Diagnosis not present

## 2020-02-19 DIAGNOSIS — R0789 Other chest pain: Secondary | ICD-10-CM | POA: Diagnosis not present

## 2020-02-19 DIAGNOSIS — Z96651 Presence of right artificial knee joint: Secondary | ICD-10-CM | POA: Diagnosis not present

## 2020-02-19 DIAGNOSIS — H53411 Scotoma involving central area, right eye: Secondary | ICD-10-CM | POA: Diagnosis not present

## 2020-02-19 DIAGNOSIS — N390 Urinary tract infection, site not specified: Secondary | ICD-10-CM | POA: Diagnosis not present

## 2020-02-19 DIAGNOSIS — R410 Disorientation, unspecified: Secondary | ICD-10-CM | POA: Diagnosis not present

## 2020-02-19 DIAGNOSIS — N200 Calculus of kidney: Secondary | ICD-10-CM | POA: Diagnosis not present

## 2020-02-19 DIAGNOSIS — M5021 Other cervical disc displacement,  high cervical region: Secondary | ICD-10-CM | POA: Diagnosis not present

## 2020-02-19 DIAGNOSIS — M48061 Spinal stenosis, lumbar region without neurogenic claudication: Secondary | ICD-10-CM | POA: Diagnosis not present

## 2020-02-19 DIAGNOSIS — E1122 Type 2 diabetes mellitus with diabetic chronic kidney disease: Secondary | ICD-10-CM | POA: Diagnosis not present

## 2020-02-19 DIAGNOSIS — G8929 Other chronic pain: Secondary | ICD-10-CM | POA: Diagnosis not present

## 2020-02-19 DIAGNOSIS — R079 Chest pain, unspecified: Secondary | ICD-10-CM | POA: Diagnosis not present

## 2020-02-19 DIAGNOSIS — G629 Polyneuropathy, unspecified: Secondary | ICD-10-CM | POA: Diagnosis not present

## 2020-02-19 DIAGNOSIS — M25461 Effusion, right knee: Secondary | ICD-10-CM | POA: Diagnosis not present

## 2020-02-19 DIAGNOSIS — Z7902 Long term (current) use of antithrombotics/antiplatelets: Secondary | ICD-10-CM | POA: Diagnosis not present

## 2020-02-19 DIAGNOSIS — R29700 NIHSS score 0: Secondary | ICD-10-CM | POA: Diagnosis not present

## 2020-02-19 DIAGNOSIS — I129 Hypertensive chronic kidney disease with stage 1 through stage 4 chronic kidney disease, or unspecified chronic kidney disease: Secondary | ICD-10-CM | POA: Diagnosis not present

## 2020-02-19 DIAGNOSIS — M19019 Primary osteoarthritis, unspecified shoulder: Secondary | ICD-10-CM | POA: Diagnosis not present

## 2020-02-19 DIAGNOSIS — R531 Weakness: Secondary | ICD-10-CM | POA: Diagnosis not present

## 2020-02-19 DIAGNOSIS — Z89511 Acquired absence of right leg below knee: Secondary | ICD-10-CM | POA: Diagnosis not present

## 2020-02-19 DIAGNOSIS — M5000 Cervical disc disorder with myelopathy, unspecified cervical region: Secondary | ICD-10-CM | POA: Diagnosis not present

## 2020-02-19 DIAGNOSIS — I272 Pulmonary hypertension, unspecified: Secondary | ICD-10-CM | POA: Diagnosis not present

## 2020-02-19 DIAGNOSIS — R319 Hematuria, unspecified: Secondary | ICD-10-CM | POA: Diagnosis not present

## 2020-02-19 DIAGNOSIS — I120 Hypertensive chronic kidney disease with stage 5 chronic kidney disease or end stage renal disease: Secondary | ICD-10-CM | POA: Diagnosis not present

## 2020-02-19 DIAGNOSIS — G4700 Insomnia, unspecified: Secondary | ICD-10-CM | POA: Diagnosis not present

## 2020-02-19 DIAGNOSIS — M6281 Muscle weakness (generalized): Secondary | ICD-10-CM | POA: Diagnosis not present

## 2020-02-19 DIAGNOSIS — S0990XA Unspecified injury of head, initial encounter: Secondary | ICD-10-CM | POA: Diagnosis not present

## 2020-02-19 DIAGNOSIS — J80 Acute respiratory distress syndrome: Secondary | ICD-10-CM | POA: Diagnosis not present

## 2020-02-19 DIAGNOSIS — I890 Lymphedema, not elsewhere classified: Secondary | ICD-10-CM | POA: Diagnosis not present

## 2020-02-19 DIAGNOSIS — R231 Pallor: Secondary | ICD-10-CM | POA: Diagnosis not present

## 2020-02-19 DIAGNOSIS — E871 Hypo-osmolality and hyponatremia: Secondary | ICD-10-CM | POA: Diagnosis not present

## 2020-02-19 DIAGNOSIS — I110 Hypertensive heart disease with heart failure: Secondary | ICD-10-CM | POA: Diagnosis not present

## 2020-02-19 DIAGNOSIS — Z8673 Personal history of transient ischemic attack (TIA), and cerebral infarction without residual deficits: Secondary | ICD-10-CM | POA: Diagnosis not present

## 2020-02-19 DIAGNOSIS — K5900 Constipation, unspecified: Secondary | ICD-10-CM | POA: Diagnosis not present

## 2020-02-19 DIAGNOSIS — Z79899 Other long term (current) drug therapy: Secondary | ICD-10-CM | POA: Diagnosis not present

## 2020-02-19 DIAGNOSIS — I6502 Occlusion and stenosis of left vertebral artery: Secondary | ICD-10-CM | POA: Diagnosis not present

## 2020-02-19 DIAGNOSIS — R1319 Other dysphagia: Secondary | ICD-10-CM | POA: Diagnosis not present

## 2020-02-19 DIAGNOSIS — Z885 Allergy status to narcotic agent status: Secondary | ICD-10-CM | POA: Diagnosis not present

## 2020-02-19 DIAGNOSIS — R000 Tachycardia, unspecified: Secondary | ICD-10-CM | POA: Diagnosis not present

## 2020-02-19 DIAGNOSIS — M1611 Unilateral primary osteoarthritis, right hip: Secondary | ICD-10-CM | POA: Diagnosis not present

## 2020-02-19 DIAGNOSIS — N23 Unspecified renal colic: Secondary | ICD-10-CM | POA: Diagnosis not present

## 2020-02-19 DIAGNOSIS — F0150 Vascular dementia without behavioral disturbance: Secondary | ICD-10-CM | POA: Diagnosis not present

## 2020-02-19 DIAGNOSIS — Z9049 Acquired absence of other specified parts of digestive tract: Secondary | ICD-10-CM | POA: Diagnosis not present

## 2020-02-19 DIAGNOSIS — Z8542 Personal history of malignant neoplasm of other parts of uterus: Secondary | ICD-10-CM | POA: Diagnosis not present

## 2020-02-19 DIAGNOSIS — S0003XA Contusion of scalp, initial encounter: Secondary | ICD-10-CM | POA: Diagnosis not present

## 2020-02-19 DIAGNOSIS — I469 Cardiac arrest, cause unspecified: Secondary | ICD-10-CM | POA: Diagnosis not present

## 2020-02-19 DIAGNOSIS — J302 Other seasonal allergic rhinitis: Secondary | ICD-10-CM | POA: Diagnosis not present

## 2020-02-19 DIAGNOSIS — E539 Vitamin B deficiency, unspecified: Secondary | ICD-10-CM | POA: Diagnosis not present

## 2020-02-19 DIAGNOSIS — J441 Chronic obstructive pulmonary disease with (acute) exacerbation: Secondary | ICD-10-CM | POA: Diagnosis not present

## 2020-02-19 DIAGNOSIS — J45909 Unspecified asthma, uncomplicated: Secondary | ICD-10-CM | POA: Diagnosis not present

## 2020-02-19 DIAGNOSIS — L97511 Non-pressure chronic ulcer of other part of right foot limited to breakdown of skin: Secondary | ICD-10-CM | POA: Diagnosis not present

## 2020-02-19 DIAGNOSIS — S32592A Other specified fracture of left pubis, initial encounter for closed fracture: Secondary | ICD-10-CM | POA: Diagnosis not present

## 2020-02-19 DIAGNOSIS — Z853 Personal history of malignant neoplasm of breast: Secondary | ICD-10-CM | POA: Diagnosis not present

## 2020-02-19 DIAGNOSIS — Z794 Long term (current) use of insulin: Secondary | ICD-10-CM | POA: Diagnosis not present

## 2020-02-19 DIAGNOSIS — J439 Emphysema, unspecified: Secondary | ICD-10-CM | POA: Diagnosis not present

## 2020-02-19 DIAGNOSIS — M79632 Pain in left forearm: Secondary | ICD-10-CM | POA: Diagnosis not present

## 2020-02-19 DIAGNOSIS — C50911 Malignant neoplasm of unspecified site of right female breast: Secondary | ICD-10-CM | POA: Diagnosis not present

## 2020-02-19 DIAGNOSIS — D72829 Elevated white blood cell count, unspecified: Secondary | ICD-10-CM | POA: Diagnosis not present

## 2020-02-19 DIAGNOSIS — M7060 Trochanteric bursitis, unspecified hip: Secondary | ICD-10-CM | POA: Diagnosis not present

## 2020-02-19 DIAGNOSIS — J90 Pleural effusion, not elsewhere classified: Secondary | ICD-10-CM | POA: Diagnosis not present

## 2020-02-19 DIAGNOSIS — I10 Essential (primary) hypertension: Secondary | ICD-10-CM | POA: Diagnosis not present

## 2020-02-19 DIAGNOSIS — M47816 Spondylosis without myelopathy or radiculopathy, lumbar region: Secondary | ICD-10-CM | POA: Diagnosis not present

## 2020-02-19 DIAGNOSIS — I4891 Unspecified atrial fibrillation: Secondary | ICD-10-CM | POA: Diagnosis not present

## 2020-02-19 DIAGNOSIS — Z8679 Personal history of other diseases of the circulatory system: Secondary | ICD-10-CM | POA: Diagnosis not present

## 2020-02-19 DIAGNOSIS — M1612 Unilateral primary osteoarthritis, left hip: Secondary | ICD-10-CM | POA: Diagnosis not present

## 2020-02-19 DIAGNOSIS — Z9181 History of falling: Secondary | ICD-10-CM | POA: Diagnosis not present

## 2020-02-19 DIAGNOSIS — S12500A Unspecified displaced fracture of sixth cervical vertebra, initial encounter for closed fracture: Secondary | ICD-10-CM | POA: Diagnosis not present

## 2020-02-19 DIAGNOSIS — M170 Bilateral primary osteoarthritis of knee: Secondary | ICD-10-CM | POA: Diagnosis not present

## 2020-02-19 DIAGNOSIS — T2220XA Burn of second degree of shoulder and upper limb, except wrist and hand, unspecified site, initial encounter: Secondary | ICD-10-CM | POA: Diagnosis not present

## 2020-02-19 DIAGNOSIS — E785 Hyperlipidemia, unspecified: Secondary | ICD-10-CM | POA: Diagnosis not present

## 2020-02-19 DIAGNOSIS — E7800 Pure hypercholesterolemia, unspecified: Secondary | ICD-10-CM | POA: Diagnosis not present

## 2020-02-19 DIAGNOSIS — N189 Chronic kidney disease, unspecified: Secondary | ICD-10-CM | POA: Diagnosis not present

## 2020-02-19 DIAGNOSIS — K5790 Diverticulosis of intestine, part unspecified, without perforation or abscess without bleeding: Secondary | ICD-10-CM | POA: Diagnosis not present

## 2020-02-19 DIAGNOSIS — F17210 Nicotine dependence, cigarettes, uncomplicated: Secondary | ICD-10-CM | POA: Diagnosis not present

## 2020-02-19 DIAGNOSIS — Z884 Allergy status to anesthetic agent status: Secondary | ICD-10-CM | POA: Diagnosis not present

## 2020-02-19 DIAGNOSIS — I2510 Atherosclerotic heart disease of native coronary artery without angina pectoris: Secondary | ICD-10-CM | POA: Diagnosis not present

## 2020-02-19 DIAGNOSIS — Z8547 Personal history of malignant neoplasm of testis: Secondary | ICD-10-CM | POA: Diagnosis not present

## 2020-02-19 DIAGNOSIS — R279 Unspecified lack of coordination: Secondary | ICD-10-CM | POA: Diagnosis not present

## 2020-02-19 DIAGNOSIS — K279 Peptic ulcer, site unspecified, unspecified as acute or chronic, without hemorrhage or perforation: Secondary | ICD-10-CM | POA: Diagnosis not present

## 2020-02-19 DIAGNOSIS — B9620 Unspecified Escherichia coli [E. coli] as the cause of diseases classified elsewhere: Secondary | ICD-10-CM | POA: Diagnosis not present

## 2020-02-19 DIAGNOSIS — G9341 Metabolic encephalopathy: Secondary | ICD-10-CM | POA: Diagnosis not present

## 2020-02-19 DIAGNOSIS — M25572 Pain in left ankle and joints of left foot: Secondary | ICD-10-CM | POA: Diagnosis not present

## 2020-02-19 DIAGNOSIS — R102 Pelvic and perineal pain: Secondary | ICD-10-CM | POA: Diagnosis not present

## 2020-02-19 DIAGNOSIS — Z7984 Long term (current) use of oral hypoglycemic drugs: Secondary | ICD-10-CM | POA: Diagnosis not present

## 2020-02-19 DIAGNOSIS — H903 Sensorineural hearing loss, bilateral: Secondary | ICD-10-CM | POA: Diagnosis not present

## 2020-02-19 DIAGNOSIS — M25532 Pain in left wrist: Secondary | ICD-10-CM | POA: Diagnosis not present

## 2020-02-19 DIAGNOSIS — F411 Generalized anxiety disorder: Secondary | ICD-10-CM | POA: Diagnosis not present

## 2020-02-19 DIAGNOSIS — U071 COVID-19: Secondary | ICD-10-CM | POA: Diagnosis not present

## 2020-02-19 DIAGNOSIS — M4316 Spondylolisthesis, lumbar region: Secondary | ICD-10-CM | POA: Diagnosis not present

## 2020-02-19 DIAGNOSIS — Z7951 Long term (current) use of inhaled steroids: Secondary | ICD-10-CM | POA: Diagnosis not present

## 2020-02-19 DIAGNOSIS — G620 Drug-induced polyneuropathy: Secondary | ICD-10-CM | POA: Diagnosis not present

## 2020-02-19 DIAGNOSIS — G214 Vascular parkinsonism: Secondary | ICD-10-CM | POA: Diagnosis not present

## 2020-02-19 DIAGNOSIS — J432 Centrilobular emphysema: Secondary | ICD-10-CM | POA: Diagnosis not present

## 2020-02-19 DIAGNOSIS — E890 Postprocedural hypothyroidism: Secondary | ICD-10-CM | POA: Diagnosis not present

## 2020-02-19 DIAGNOSIS — M50022 Cervical disc disorder at C5-C6 level with myelopathy: Secondary | ICD-10-CM | POA: Diagnosis not present

## 2020-02-19 DIAGNOSIS — I872 Venous insufficiency (chronic) (peripheral): Secondary | ICD-10-CM | POA: Diagnosis not present

## 2020-02-19 DIAGNOSIS — G44219 Episodic tension-type headache, not intractable: Secondary | ICD-10-CM | POA: Diagnosis not present

## 2020-02-19 DIAGNOSIS — H44512 Absolute glaucoma, left eye: Secondary | ICD-10-CM | POA: Diagnosis not present

## 2020-02-19 DIAGNOSIS — Z0000 Encounter for general adult medical examination without abnormal findings: Secondary | ICD-10-CM | POA: Diagnosis not present

## 2020-02-19 DIAGNOSIS — Z743 Need for continuous supervision: Secondary | ICD-10-CM | POA: Diagnosis not present

## 2020-02-19 DIAGNOSIS — Z992 Dependence on renal dialysis: Secondary | ICD-10-CM | POA: Diagnosis not present

## 2020-02-19 DIAGNOSIS — D509 Iron deficiency anemia, unspecified: Secondary | ICD-10-CM | POA: Diagnosis not present

## 2020-02-19 DIAGNOSIS — E039 Hypothyroidism, unspecified: Secondary | ICD-10-CM | POA: Diagnosis not present

## 2020-02-19 DIAGNOSIS — L03113 Cellulitis of right upper limb: Secondary | ICD-10-CM | POA: Diagnosis not present

## 2020-02-19 DIAGNOSIS — Z85118 Personal history of other malignant neoplasm of bronchus and lung: Secondary | ICD-10-CM | POA: Diagnosis not present

## 2020-02-19 DIAGNOSIS — M10032 Idiopathic gout, left wrist: Secondary | ICD-10-CM | POA: Diagnosis not present

## 2020-02-19 DIAGNOSIS — M25561 Pain in right knee: Secondary | ICD-10-CM | POA: Diagnosis not present

## 2020-02-19 DIAGNOSIS — S32810A Multiple fractures of pelvis with stable disruption of pelvic ring, initial encounter for closed fracture: Secondary | ICD-10-CM | POA: Diagnosis not present

## 2020-02-19 DIAGNOSIS — Z87891 Personal history of nicotine dependence: Secondary | ICD-10-CM | POA: Diagnosis not present

## 2020-02-19 DIAGNOSIS — R0902 Hypoxemia: Secondary | ICD-10-CM | POA: Diagnosis not present

## 2020-02-19 DIAGNOSIS — L89150 Pressure ulcer of sacral region, unstageable: Secondary | ICD-10-CM | POA: Diagnosis not present

## 2020-02-19 DIAGNOSIS — I4819 Other persistent atrial fibrillation: Secondary | ICD-10-CM | POA: Diagnosis not present

## 2020-02-19 DIAGNOSIS — M1711 Unilateral primary osteoarthritis, right knee: Secondary | ICD-10-CM | POA: Diagnosis not present

## 2020-02-19 DIAGNOSIS — J45901 Unspecified asthma with (acute) exacerbation: Secondary | ICD-10-CM | POA: Diagnosis not present

## 2020-02-19 DIAGNOSIS — M25432 Effusion, left wrist: Secondary | ICD-10-CM | POA: Diagnosis not present

## 2020-02-19 DIAGNOSIS — G894 Chronic pain syndrome: Secondary | ICD-10-CM | POA: Diagnosis not present

## 2020-02-19 DIAGNOSIS — I5032 Chronic diastolic (congestive) heart failure: Secondary | ICD-10-CM | POA: Diagnosis not present

## 2020-02-19 DIAGNOSIS — F329 Major depressive disorder, single episode, unspecified: Secondary | ICD-10-CM | POA: Diagnosis not present

## 2020-02-19 DIAGNOSIS — R1030 Lower abdominal pain, unspecified: Secondary | ICD-10-CM | POA: Diagnosis not present

## 2020-02-19 DIAGNOSIS — M7989 Other specified soft tissue disorders: Secondary | ICD-10-CM | POA: Diagnosis not present

## 2020-02-19 DIAGNOSIS — I5022 Chronic systolic (congestive) heart failure: Secondary | ICD-10-CM | POA: Diagnosis not present

## 2020-02-19 DIAGNOSIS — M2578 Osteophyte, vertebrae: Secondary | ICD-10-CM | POA: Diagnosis not present

## 2020-02-19 DIAGNOSIS — F4310 Post-traumatic stress disorder, unspecified: Secondary | ICD-10-CM | POA: Diagnosis not present

## 2020-02-19 DIAGNOSIS — M79602 Pain in left arm: Secondary | ICD-10-CM | POA: Diagnosis not present

## 2020-02-19 DIAGNOSIS — G4733 Obstructive sleep apnea (adult) (pediatric): Secondary | ICD-10-CM | POA: Diagnosis not present

## 2020-02-19 DIAGNOSIS — B961 Klebsiella pneumoniae [K. pneumoniae] as the cause of diseases classified elsewhere: Secondary | ICD-10-CM | POA: Diagnosis not present

## 2020-02-19 DIAGNOSIS — R296 Repeated falls: Secondary | ICD-10-CM | POA: Diagnosis not present

## 2020-02-19 DIAGNOSIS — T22031A Burn of unspecified degree of right upper arm, initial encounter: Secondary | ICD-10-CM | POA: Diagnosis not present

## 2020-02-19 DIAGNOSIS — K649 Unspecified hemorrhoids: Secondary | ICD-10-CM | POA: Diagnosis not present

## 2020-02-19 DIAGNOSIS — Z7983 Long term (current) use of bisphosphonates: Secondary | ICD-10-CM | POA: Diagnosis not present

## 2020-02-19 DIAGNOSIS — M810 Age-related osteoporosis without current pathological fracture: Secondary | ICD-10-CM | POA: Diagnosis not present

## 2020-02-19 DIAGNOSIS — I498 Other specified cardiac arrhythmias: Secondary | ICD-10-CM | POA: Diagnosis not present

## 2020-02-19 DIAGNOSIS — R1310 Dysphagia, unspecified: Secondary | ICD-10-CM | POA: Diagnosis not present

## 2020-02-19 DIAGNOSIS — Z981 Arthrodesis status: Secondary | ICD-10-CM | POA: Diagnosis not present

## 2020-02-19 DIAGNOSIS — R0602 Shortness of breath: Secondary | ICD-10-CM | POA: Diagnosis not present

## 2020-02-19 DIAGNOSIS — Z90710 Acquired absence of both cervix and uterus: Secondary | ICD-10-CM | POA: Diagnosis not present

## 2020-02-19 DIAGNOSIS — R404 Transient alteration of awareness: Secondary | ICD-10-CM | POA: Diagnosis not present

## 2020-02-19 DIAGNOSIS — I25810 Atherosclerosis of coronary artery bypass graft(s) without angina pectoris: Secondary | ICD-10-CM | POA: Diagnosis not present

## 2020-02-19 DIAGNOSIS — R110 Nausea: Secondary | ICD-10-CM | POA: Diagnosis not present

## 2020-02-19 DIAGNOSIS — G9340 Encephalopathy, unspecified: Secondary | ICD-10-CM | POA: Diagnosis not present

## 2020-02-19 DIAGNOSIS — J9611 Chronic respiratory failure with hypoxia: Secondary | ICD-10-CM | POA: Diagnosis not present

## 2020-02-19 DIAGNOSIS — K219 Gastro-esophageal reflux disease without esophagitis: Secondary | ICD-10-CM | POA: Diagnosis not present

## 2020-02-19 DIAGNOSIS — T83090A Other mechanical complication of cystostomy catheter, initial encounter: Secondary | ICD-10-CM | POA: Diagnosis not present

## 2020-02-19 DIAGNOSIS — R05 Cough: Secondary | ICD-10-CM | POA: Diagnosis not present

## 2020-02-19 DIAGNOSIS — Z79891 Long term (current) use of opiate analgesic: Secondary | ICD-10-CM | POA: Diagnosis not present

## 2020-02-19 DIAGNOSIS — I639 Cerebral infarction, unspecified: Secondary | ICD-10-CM | POA: Diagnosis not present

## 2020-02-19 DIAGNOSIS — N3001 Acute cystitis with hematuria: Secondary | ICD-10-CM | POA: Diagnosis not present

## 2020-02-19 DIAGNOSIS — Z79890 Hormone replacement therapy (postmenopausal): Secondary | ICD-10-CM | POA: Diagnosis not present

## 2020-02-19 DIAGNOSIS — R52 Pain, unspecified: Secondary | ICD-10-CM | POA: Diagnosis not present

## 2020-02-19 DIAGNOSIS — R072 Precordial pain: Secondary | ICD-10-CM | POA: Diagnosis not present

## 2020-02-19 DIAGNOSIS — Z96641 Presence of right artificial hip joint: Secondary | ICD-10-CM | POA: Diagnosis not present

## 2020-02-19 DIAGNOSIS — N183 Chronic kidney disease, stage 3 unspecified: Secondary | ICD-10-CM | POA: Diagnosis not present

## 2020-02-19 DIAGNOSIS — M109 Gout, unspecified: Secondary | ICD-10-CM | POA: Diagnosis not present

## 2020-02-19 DIAGNOSIS — Z01812 Encounter for preprocedural laboratory examination: Secondary | ICD-10-CM | POA: Diagnosis not present

## 2020-02-19 DIAGNOSIS — T451X5S Adverse effect of antineoplastic and immunosuppressive drugs, sequela: Secondary | ICD-10-CM | POA: Diagnosis not present

## 2020-02-19 DIAGNOSIS — M25571 Pain in right ankle and joints of right foot: Secondary | ICD-10-CM | POA: Diagnosis not present

## 2020-02-19 DIAGNOSIS — R0689 Other abnormalities of breathing: Secondary | ICD-10-CM | POA: Diagnosis not present

## 2020-02-19 DIAGNOSIS — M542 Cervicalgia: Secondary | ICD-10-CM | POA: Diagnosis not present

## 2020-02-19 DIAGNOSIS — M5382 Other specified dorsopathies, cervical region: Secondary | ICD-10-CM | POA: Diagnosis not present

## 2020-02-19 DIAGNOSIS — M19032 Primary osteoarthritis, left wrist: Secondary | ICD-10-CM | POA: Diagnosis not present

## 2020-02-19 DIAGNOSIS — Z7982 Long term (current) use of aspirin: Secondary | ICD-10-CM | POA: Diagnosis not present

## 2020-02-19 DIAGNOSIS — Z20822 Contact with and (suspected) exposure to covid-19: Secondary | ICD-10-CM | POA: Diagnosis not present

## 2020-02-19 DIAGNOSIS — L298 Other pruritus: Secondary | ICD-10-CM | POA: Diagnosis not present

## 2020-02-19 DIAGNOSIS — Z01810 Encounter for preprocedural cardiovascular examination: Secondary | ICD-10-CM | POA: Diagnosis not present

## 2020-02-19 DIAGNOSIS — M5416 Radiculopathy, lumbar region: Secondary | ICD-10-CM | POA: Diagnosis not present

## 2020-02-19 DIAGNOSIS — R2681 Unsteadiness on feet: Secondary | ICD-10-CM | POA: Diagnosis not present

## 2020-02-19 DIAGNOSIS — R42 Dizziness and giddiness: Secondary | ICD-10-CM | POA: Diagnosis not present

## 2020-02-19 DIAGNOSIS — R55 Syncope and collapse: Secondary | ICD-10-CM | POA: Diagnosis not present

## 2020-02-19 DIAGNOSIS — M4312 Spondylolisthesis, cervical region: Secondary | ICD-10-CM | POA: Diagnosis not present

## 2020-02-19 DIAGNOSIS — Z7952 Long term (current) use of systemic steroids: Secondary | ICD-10-CM | POA: Diagnosis not present

## 2020-02-19 DIAGNOSIS — R278 Other lack of coordination: Secondary | ICD-10-CM | POA: Diagnosis not present

## 2020-02-19 DIAGNOSIS — M4802 Spinal stenosis, cervical region: Secondary | ICD-10-CM | POA: Diagnosis not present

## 2020-02-19 DIAGNOSIS — F419 Anxiety disorder, unspecified: Secondary | ICD-10-CM | POA: Diagnosis not present

## 2020-02-19 DIAGNOSIS — E119 Type 2 diabetes mellitus without complications: Secondary | ICD-10-CM | POA: Diagnosis not present

## 2020-02-19 DIAGNOSIS — R197 Diarrhea, unspecified: Secondary | ICD-10-CM | POA: Diagnosis not present

## 2020-02-19 DIAGNOSIS — R109 Unspecified abdominal pain: Secondary | ICD-10-CM | POA: Diagnosis not present

## 2020-02-19 DIAGNOSIS — E1165 Type 2 diabetes mellitus with hyperglycemia: Secondary | ICD-10-CM | POA: Diagnosis not present

## 2020-02-19 DIAGNOSIS — M546 Pain in thoracic spine: Secondary | ICD-10-CM | POA: Diagnosis not present

## 2020-02-19 DIAGNOSIS — F0390 Unspecified dementia without behavioral disturbance: Secondary | ICD-10-CM | POA: Diagnosis not present

## 2020-02-19 DIAGNOSIS — M8580 Other specified disorders of bone density and structure, unspecified site: Secondary | ICD-10-CM | POA: Diagnosis not present

## 2020-02-19 DIAGNOSIS — M25552 Pain in left hip: Secondary | ICD-10-CM | POA: Diagnosis not present

## 2020-02-19 DIAGNOSIS — Z9981 Dependence on supplemental oxygen: Secondary | ICD-10-CM | POA: Diagnosis not present

## 2020-02-19 DIAGNOSIS — Z96653 Presence of artificial knee joint, bilateral: Secondary | ICD-10-CM | POA: Diagnosis not present

## 2020-02-19 DIAGNOSIS — G20 Parkinson's disease: Secondary | ICD-10-CM | POA: Diagnosis not present

## 2020-02-19 DIAGNOSIS — R0600 Dyspnea, unspecified: Secondary | ICD-10-CM | POA: Diagnosis not present

## 2020-02-19 DIAGNOSIS — R300 Dysuria: Secondary | ICD-10-CM | POA: Diagnosis not present

## 2020-02-19 DIAGNOSIS — T83038A Leakage of other indwelling urethral catheter, initial encounter: Secondary | ICD-10-CM | POA: Diagnosis not present

## 2020-02-19 DIAGNOSIS — K5903 Drug induced constipation: Secondary | ICD-10-CM | POA: Diagnosis not present

## 2020-02-19 DIAGNOSIS — N882 Stricture and stenosis of cervix uteri: Secondary | ICD-10-CM | POA: Diagnosis not present

## 2020-02-19 DIAGNOSIS — E7849 Other hyperlipidemia: Secondary | ICD-10-CM | POA: Diagnosis not present

## 2020-02-19 DIAGNOSIS — Z4789 Encounter for other orthopedic aftercare: Secondary | ICD-10-CM | POA: Diagnosis not present

## 2020-02-19 DIAGNOSIS — N186 End stage renal disease: Secondary | ICD-10-CM | POA: Diagnosis not present

## 2020-02-19 DIAGNOSIS — Z66 Do not resuscitate: Secondary | ICD-10-CM | POA: Diagnosis not present

## 2020-02-19 DIAGNOSIS — M545 Low back pain: Secondary | ICD-10-CM | POA: Diagnosis not present

## 2020-02-20 DIAGNOSIS — S329XXA Fracture of unspecified parts of lumbosacral spine and pelvis, initial encounter for closed fracture: Secondary | ICD-10-CM | POA: Diagnosis not present

## 2020-02-20 DIAGNOSIS — R197 Diarrhea, unspecified: Secondary | ICD-10-CM | POA: Diagnosis not present

## 2020-02-20 DIAGNOSIS — G894 Chronic pain syndrome: Secondary | ICD-10-CM | POA: Diagnosis not present

## 2020-02-20 DIAGNOSIS — Z886 Allergy status to analgesic agent status: Secondary | ICD-10-CM | POA: Diagnosis not present

## 2020-02-20 DIAGNOSIS — S32592A Other specified fracture of left pubis, initial encounter for closed fracture: Secondary | ICD-10-CM | POA: Diagnosis not present

## 2020-02-20 DIAGNOSIS — J9621 Acute and chronic respiratory failure with hypoxia: Secondary | ICD-10-CM | POA: Diagnosis not present

## 2020-02-20 DIAGNOSIS — R069 Unspecified abnormalities of breathing: Secondary | ICD-10-CM | POA: Diagnosis not present

## 2020-02-20 DIAGNOSIS — E785 Hyperlipidemia, unspecified: Secondary | ICD-10-CM | POA: Diagnosis not present

## 2020-02-20 DIAGNOSIS — E041 Nontoxic single thyroid nodule: Secondary | ICD-10-CM | POA: Diagnosis not present

## 2020-02-20 DIAGNOSIS — R918 Other nonspecific abnormal finding of lung field: Secondary | ICD-10-CM | POA: Diagnosis not present

## 2020-02-20 DIAGNOSIS — R911 Solitary pulmonary nodule: Secondary | ICD-10-CM | POA: Diagnosis not present

## 2020-02-20 DIAGNOSIS — R251 Tremor, unspecified: Secondary | ICD-10-CM | POA: Diagnosis not present

## 2020-02-20 DIAGNOSIS — R490 Dysphonia: Secondary | ICD-10-CM | POA: Diagnosis not present

## 2020-02-20 DIAGNOSIS — R030 Elevated blood-pressure reading, without diagnosis of hypertension: Secondary | ICD-10-CM | POA: Diagnosis not present

## 2020-02-20 DIAGNOSIS — E1165 Type 2 diabetes mellitus with hyperglycemia: Secondary | ICD-10-CM | POA: Diagnosis not present

## 2020-02-20 DIAGNOSIS — M549 Dorsalgia, unspecified: Secondary | ICD-10-CM | POA: Diagnosis not present

## 2020-02-20 DIAGNOSIS — K921 Melena: Secondary | ICD-10-CM | POA: Diagnosis not present

## 2020-02-20 DIAGNOSIS — R0603 Acute respiratory distress: Secondary | ICD-10-CM | POA: Diagnosis not present

## 2020-02-20 DIAGNOSIS — G4733 Obstructive sleep apnea (adult) (pediatric): Secondary | ICD-10-CM | POA: Diagnosis not present

## 2020-02-20 DIAGNOSIS — R5381 Other malaise: Secondary | ICD-10-CM | POA: Diagnosis not present

## 2020-02-20 DIAGNOSIS — I482 Chronic atrial fibrillation, unspecified: Secondary | ICD-10-CM | POA: Diagnosis not present

## 2020-02-20 DIAGNOSIS — S3282XA Multiple fractures of pelvis without disruption of pelvic ring, initial encounter for closed fracture: Secondary | ICD-10-CM | POA: Diagnosis not present

## 2020-02-20 DIAGNOSIS — G629 Polyneuropathy, unspecified: Secondary | ICD-10-CM | POA: Diagnosis not present

## 2020-02-20 DIAGNOSIS — Z66 Do not resuscitate: Secondary | ICD-10-CM | POA: Diagnosis not present

## 2020-02-20 DIAGNOSIS — J449 Chronic obstructive pulmonary disease, unspecified: Secondary | ICD-10-CM | POA: Diagnosis not present

## 2020-02-20 DIAGNOSIS — W19XXXA Unspecified fall, initial encounter: Secondary | ICD-10-CM | POA: Diagnosis not present

## 2020-02-20 DIAGNOSIS — S32512A Fracture of superior rim of left pubis, initial encounter for closed fracture: Secondary | ICD-10-CM | POA: Diagnosis not present

## 2020-02-20 DIAGNOSIS — R05 Cough: Secondary | ICD-10-CM | POA: Diagnosis not present

## 2020-02-20 DIAGNOSIS — S6992XA Unspecified injury of left wrist, hand and finger(s), initial encounter: Secondary | ICD-10-CM | POA: Diagnosis not present

## 2020-02-20 DIAGNOSIS — D519 Vitamin B12 deficiency anemia, unspecified: Secondary | ICD-10-CM | POA: Diagnosis not present

## 2020-02-20 DIAGNOSIS — R269 Unspecified abnormalities of gait and mobility: Secondary | ICD-10-CM | POA: Diagnosis not present

## 2020-02-20 DIAGNOSIS — Z882 Allergy status to sulfonamides status: Secondary | ICD-10-CM | POA: Diagnosis not present

## 2020-02-20 DIAGNOSIS — E876 Hypokalemia: Secondary | ICD-10-CM | POA: Diagnosis not present

## 2020-02-20 DIAGNOSIS — Z79899 Other long term (current) drug therapy: Secondary | ICD-10-CM | POA: Diagnosis not present

## 2020-02-20 DIAGNOSIS — M25551 Pain in right hip: Secondary | ICD-10-CM | POA: Diagnosis not present

## 2020-02-20 DIAGNOSIS — F419 Anxiety disorder, unspecified: Secondary | ICD-10-CM | POA: Diagnosis not present

## 2020-02-20 DIAGNOSIS — R001 Bradycardia, unspecified: Secondary | ICD-10-CM | POA: Diagnosis not present

## 2020-02-20 DIAGNOSIS — N281 Cyst of kidney, acquired: Secondary | ICD-10-CM | POA: Diagnosis not present

## 2020-02-20 DIAGNOSIS — Z8661 Personal history of infections of the central nervous system: Secondary | ICD-10-CM | POA: Diagnosis not present

## 2020-02-20 DIAGNOSIS — M25552 Pain in left hip: Secondary | ICD-10-CM | POA: Diagnosis not present

## 2020-02-20 DIAGNOSIS — J189 Pneumonia, unspecified organism: Secondary | ICD-10-CM | POA: Diagnosis not present

## 2020-02-20 DIAGNOSIS — S199XXA Unspecified injury of neck, initial encounter: Secondary | ICD-10-CM | POA: Diagnosis not present

## 2020-02-20 DIAGNOSIS — M8580 Other specified disorders of bone density and structure, unspecified site: Secondary | ICD-10-CM | POA: Diagnosis not present

## 2020-02-20 DIAGNOSIS — I472 Ventricular tachycardia: Secondary | ICD-10-CM | POA: Diagnosis not present

## 2020-02-20 DIAGNOSIS — I252 Old myocardial infarction: Secondary | ICD-10-CM | POA: Diagnosis not present

## 2020-02-20 DIAGNOSIS — M79643 Pain in unspecified hand: Secondary | ICD-10-CM | POA: Diagnosis not present

## 2020-02-20 DIAGNOSIS — R531 Weakness: Secondary | ICD-10-CM | POA: Diagnosis not present

## 2020-02-20 DIAGNOSIS — I208 Other forms of angina pectoris: Secondary | ICD-10-CM | POA: Diagnosis not present

## 2020-02-20 DIAGNOSIS — N319 Neuromuscular dysfunction of bladder, unspecified: Secondary | ICD-10-CM | POA: Diagnosis not present

## 2020-02-20 DIAGNOSIS — K589 Irritable bowel syndrome without diarrhea: Secondary | ICD-10-CM | POA: Diagnosis not present

## 2020-02-20 DIAGNOSIS — M170 Bilateral primary osteoarthritis of knee: Secondary | ICD-10-CM | POA: Diagnosis not present

## 2020-02-20 DIAGNOSIS — K2100 Gastro-esophageal reflux disease with esophagitis, without bleeding: Secondary | ICD-10-CM | POA: Diagnosis not present

## 2020-02-20 DIAGNOSIS — J069 Acute upper respiratory infection, unspecified: Secondary | ICD-10-CM | POA: Diagnosis not present

## 2020-02-20 DIAGNOSIS — I5031 Acute diastolic (congestive) heart failure: Secondary | ICD-10-CM | POA: Diagnosis not present

## 2020-02-20 DIAGNOSIS — R0602 Shortness of breath: Secondary | ICD-10-CM | POA: Diagnosis not present

## 2020-02-20 DIAGNOSIS — G9340 Encephalopathy, unspecified: Secondary | ICD-10-CM | POA: Diagnosis not present

## 2020-02-20 DIAGNOSIS — Z7902 Long term (current) use of antithrombotics/antiplatelets: Secondary | ICD-10-CM | POA: Diagnosis not present

## 2020-02-20 DIAGNOSIS — Z20822 Contact with and (suspected) exposure to covid-19: Secondary | ICD-10-CM | POA: Diagnosis not present

## 2020-02-20 DIAGNOSIS — I4510 Unspecified right bundle-branch block: Secondary | ICD-10-CM | POA: Diagnosis not present

## 2020-02-20 DIAGNOSIS — I4819 Other persistent atrial fibrillation: Secondary | ICD-10-CM | POA: Diagnosis not present

## 2020-02-20 DIAGNOSIS — I130 Hypertensive heart and chronic kidney disease with heart failure and stage 1 through stage 4 chronic kidney disease, or unspecified chronic kidney disease: Secondary | ICD-10-CM | POA: Diagnosis not present

## 2020-02-20 DIAGNOSIS — J439 Emphysema, unspecified: Secondary | ICD-10-CM | POA: Diagnosis not present

## 2020-02-20 DIAGNOSIS — Z888 Allergy status to other drugs, medicaments and biological substances status: Secondary | ICD-10-CM | POA: Diagnosis not present

## 2020-02-20 DIAGNOSIS — R58 Hemorrhage, not elsewhere classified: Secondary | ICD-10-CM | POA: Diagnosis not present

## 2020-02-20 DIAGNOSIS — J45901 Unspecified asthma with (acute) exacerbation: Secondary | ICD-10-CM | POA: Diagnosis not present

## 2020-02-20 DIAGNOSIS — Z9981 Dependence on supplemental oxygen: Secondary | ICD-10-CM | POA: Diagnosis not present

## 2020-02-20 DIAGNOSIS — R319 Hematuria, unspecified: Secondary | ICD-10-CM | POA: Diagnosis not present

## 2020-02-20 DIAGNOSIS — Z881 Allergy status to other antibiotic agents status: Secondary | ICD-10-CM | POA: Diagnosis not present

## 2020-02-20 DIAGNOSIS — B9689 Other specified bacterial agents as the cause of diseases classified elsewhere: Secondary | ICD-10-CM | POA: Diagnosis not present

## 2020-02-20 DIAGNOSIS — E039 Hypothyroidism, unspecified: Secondary | ICD-10-CM | POA: Diagnosis not present

## 2020-02-20 DIAGNOSIS — J479 Bronchiectasis, uncomplicated: Secondary | ICD-10-CM | POA: Diagnosis not present

## 2020-02-20 DIAGNOSIS — N3281 Overactive bladder: Secondary | ICD-10-CM | POA: Diagnosis not present

## 2020-02-20 DIAGNOSIS — J383 Other diseases of vocal cords: Secondary | ICD-10-CM | POA: Diagnosis not present

## 2020-02-20 DIAGNOSIS — D518 Other vitamin B12 deficiency anemias: Secondary | ICD-10-CM | POA: Diagnosis not present

## 2020-02-20 DIAGNOSIS — J188 Other pneumonia, unspecified organism: Secondary | ICD-10-CM | POA: Diagnosis not present

## 2020-02-20 DIAGNOSIS — Z7901 Long term (current) use of anticoagulants: Secondary | ICD-10-CM | POA: Diagnosis not present

## 2020-02-20 DIAGNOSIS — I10 Essential (primary) hypertension: Secondary | ICD-10-CM | POA: Diagnosis not present

## 2020-02-20 DIAGNOSIS — M792 Neuralgia and neuritis, unspecified: Secondary | ICD-10-CM | POA: Diagnosis not present

## 2020-02-20 DIAGNOSIS — R062 Wheezing: Secondary | ICD-10-CM | POA: Diagnosis not present

## 2020-02-20 DIAGNOSIS — E860 Dehydration: Secondary | ICD-10-CM | POA: Diagnosis not present

## 2020-02-20 DIAGNOSIS — M62551 Muscle wasting and atrophy, not elsewhere classified, right thigh: Secondary | ICD-10-CM | POA: Diagnosis not present

## 2020-02-20 DIAGNOSIS — J9811 Atelectasis: Secondary | ICD-10-CM | POA: Diagnosis not present

## 2020-02-20 DIAGNOSIS — R296 Repeated falls: Secondary | ICD-10-CM | POA: Diagnosis not present

## 2020-02-20 DIAGNOSIS — F329 Major depressive disorder, single episode, unspecified: Secondary | ICD-10-CM | POA: Diagnosis not present

## 2020-02-20 DIAGNOSIS — I4891 Unspecified atrial fibrillation: Secondary | ICD-10-CM | POA: Diagnosis not present

## 2020-02-20 DIAGNOSIS — F0390 Unspecified dementia without behavioral disturbance: Secondary | ICD-10-CM | POA: Diagnosis not present

## 2020-02-20 DIAGNOSIS — M109 Gout, unspecified: Secondary | ICD-10-CM | POA: Diagnosis not present

## 2020-02-20 DIAGNOSIS — R0902 Hypoxemia: Secondary | ICD-10-CM | POA: Diagnosis not present

## 2020-02-20 DIAGNOSIS — I5033 Acute on chronic diastolic (congestive) heart failure: Secondary | ICD-10-CM | POA: Diagnosis not present

## 2020-02-20 DIAGNOSIS — T8450XA Infection and inflammatory reaction due to unspecified internal joint prosthesis, initial encounter: Secondary | ICD-10-CM | POA: Diagnosis not present

## 2020-02-20 DIAGNOSIS — Z7984 Long term (current) use of oral hypoglycemic drugs: Secondary | ICD-10-CM | POA: Diagnosis not present

## 2020-02-20 DIAGNOSIS — Z6830 Body mass index (BMI) 30.0-30.9, adult: Secondary | ICD-10-CM | POA: Diagnosis not present

## 2020-02-20 DIAGNOSIS — I712 Thoracic aortic aneurysm, without rupture: Secondary | ICD-10-CM | POA: Diagnosis not present

## 2020-02-20 DIAGNOSIS — I509 Heart failure, unspecified: Secondary | ICD-10-CM | POA: Diagnosis not present

## 2020-02-20 DIAGNOSIS — S0990XA Unspecified injury of head, initial encounter: Secondary | ICD-10-CM | POA: Diagnosis not present

## 2020-02-20 DIAGNOSIS — I480 Paroxysmal atrial fibrillation: Secondary | ICD-10-CM | POA: Diagnosis not present

## 2020-02-20 DIAGNOSIS — I2510 Atherosclerotic heart disease of native coronary artery without angina pectoris: Secondary | ICD-10-CM | POA: Diagnosis not present

## 2020-02-20 DIAGNOSIS — Z792 Long term (current) use of antibiotics: Secondary | ICD-10-CM | POA: Diagnosis not present

## 2020-02-20 DIAGNOSIS — Z9181 History of falling: Secondary | ICD-10-CM | POA: Diagnosis not present

## 2020-02-20 DIAGNOSIS — K760 Fatty (change of) liver, not elsewhere classified: Secondary | ICD-10-CM | POA: Diagnosis not present

## 2020-02-20 DIAGNOSIS — D509 Iron deficiency anemia, unspecified: Secondary | ICD-10-CM | POA: Diagnosis not present

## 2020-02-20 DIAGNOSIS — R350 Frequency of micturition: Secondary | ICD-10-CM | POA: Diagnosis not present

## 2020-02-20 DIAGNOSIS — I471 Supraventricular tachycardia: Secondary | ICD-10-CM | POA: Diagnosis not present

## 2020-02-20 DIAGNOSIS — J471 Bronchiectasis with (acute) exacerbation: Secondary | ICD-10-CM | POA: Diagnosis not present

## 2020-02-20 DIAGNOSIS — N183 Chronic kidney disease, stage 3 unspecified: Secondary | ICD-10-CM | POA: Diagnosis not present

## 2020-02-20 DIAGNOSIS — R000 Tachycardia, unspecified: Secondary | ICD-10-CM | POA: Diagnosis not present

## 2020-02-20 DIAGNOSIS — R339 Retention of urine, unspecified: Secondary | ICD-10-CM | POA: Diagnosis not present

## 2020-02-20 DIAGNOSIS — J4540 Moderate persistent asthma, uncomplicated: Secondary | ICD-10-CM | POA: Diagnosis not present

## 2020-02-20 DIAGNOSIS — S4992XA Unspecified injury of left shoulder and upper arm, initial encounter: Secondary | ICD-10-CM | POA: Diagnosis not present

## 2020-02-20 DIAGNOSIS — Z880 Allergy status to penicillin: Secondary | ICD-10-CM | POA: Diagnosis not present

## 2020-02-20 DIAGNOSIS — R270 Ataxia, unspecified: Secondary | ICD-10-CM | POA: Diagnosis not present

## 2020-02-20 DIAGNOSIS — J441 Chronic obstructive pulmonary disease with (acute) exacerbation: Secondary | ICD-10-CM | POA: Diagnosis not present

## 2020-02-20 DIAGNOSIS — Z90710 Acquired absence of both cervix and uterus: Secondary | ICD-10-CM | POA: Diagnosis not present

## 2020-02-20 DIAGNOSIS — R519 Headache, unspecified: Secondary | ICD-10-CM | POA: Diagnosis not present

## 2020-02-20 DIAGNOSIS — J9601 Acute respiratory failure with hypoxia: Secondary | ICD-10-CM | POA: Diagnosis not present

## 2020-02-20 DIAGNOSIS — Z87891 Personal history of nicotine dependence: Secondary | ICD-10-CM | POA: Diagnosis not present

## 2020-02-20 DIAGNOSIS — L03311 Cellulitis of abdominal wall: Secondary | ICD-10-CM | POA: Diagnosis not present

## 2020-02-20 DIAGNOSIS — R079 Chest pain, unspecified: Secondary | ICD-10-CM | POA: Diagnosis not present

## 2020-02-20 DIAGNOSIS — H9221 Otorrhagia, right ear: Secondary | ICD-10-CM | POA: Diagnosis not present

## 2020-02-20 DIAGNOSIS — I5032 Chronic diastolic (congestive) heart failure: Secondary | ICD-10-CM | POA: Diagnosis not present

## 2020-02-20 DIAGNOSIS — N390 Urinary tract infection, site not specified: Secondary | ICD-10-CM | POA: Diagnosis not present

## 2020-02-20 DIAGNOSIS — J329 Chronic sinusitis, unspecified: Secondary | ICD-10-CM | POA: Diagnosis not present

## 2020-02-20 DIAGNOSIS — K5900 Constipation, unspecified: Secondary | ICD-10-CM | POA: Diagnosis not present

## 2020-02-20 DIAGNOSIS — K648 Other hemorrhoids: Secondary | ICD-10-CM | POA: Diagnosis not present

## 2020-02-20 DIAGNOSIS — E119 Type 2 diabetes mellitus without complications: Secondary | ICD-10-CM | POA: Diagnosis not present

## 2020-02-20 DIAGNOSIS — I420 Dilated cardiomyopathy: Secondary | ICD-10-CM | POA: Diagnosis not present

## 2020-02-20 DIAGNOSIS — G4700 Insomnia, unspecified: Secondary | ICD-10-CM | POA: Diagnosis not present

## 2020-02-20 DIAGNOSIS — K219 Gastro-esophageal reflux disease without esophagitis: Secondary | ICD-10-CM | POA: Diagnosis not present

## 2020-02-20 DIAGNOSIS — Z8679 Personal history of other diseases of the circulatory system: Secondary | ICD-10-CM | POA: Diagnosis not present

## 2020-02-20 DIAGNOSIS — Z7982 Long term (current) use of aspirin: Secondary | ICD-10-CM | POA: Diagnosis not present

## 2020-02-20 DIAGNOSIS — J181 Lobar pneumonia, unspecified organism: Secondary | ICD-10-CM | POA: Diagnosis not present

## 2020-02-20 DIAGNOSIS — G9341 Metabolic encephalopathy: Secondary | ICD-10-CM | POA: Diagnosis not present

## 2020-02-20 DIAGNOSIS — D72829 Elevated white blood cell count, unspecified: Secondary | ICD-10-CM | POA: Diagnosis not present

## 2020-02-20 DIAGNOSIS — J9611 Chronic respiratory failure with hypoxia: Secondary | ICD-10-CM | POA: Diagnosis not present

## 2020-02-20 DIAGNOSIS — Z5321 Procedure and treatment not carried out due to patient leaving prior to being seen by health care provider: Secondary | ICD-10-CM | POA: Diagnosis not present

## 2020-02-20 DIAGNOSIS — E669 Obesity, unspecified: Secondary | ICD-10-CM | POA: Diagnosis not present

## 2020-02-20 DIAGNOSIS — F319 Bipolar disorder, unspecified: Secondary | ICD-10-CM | POA: Diagnosis not present

## 2020-02-20 DIAGNOSIS — R634 Abnormal weight loss: Secondary | ICD-10-CM | POA: Diagnosis not present

## 2020-02-20 DIAGNOSIS — Z1211 Encounter for screening for malignant neoplasm of colon: Secondary | ICD-10-CM | POA: Diagnosis not present

## 2020-02-20 DIAGNOSIS — R52 Pain, unspecified: Secondary | ICD-10-CM | POA: Diagnosis not present

## 2020-02-20 DIAGNOSIS — Z8546 Personal history of malignant neoplasm of prostate: Secondary | ICD-10-CM | POA: Diagnosis not present

## 2020-02-20 DIAGNOSIS — S61412A Laceration without foreign body of left hand, initial encounter: Secondary | ICD-10-CM | POA: Diagnosis not present

## 2020-02-20 DIAGNOSIS — I493 Ventricular premature depolarization: Secondary | ICD-10-CM | POA: Diagnosis not present

## 2020-02-20 DIAGNOSIS — I110 Hypertensive heart disease with heart failure: Secondary | ICD-10-CM | POA: Diagnosis not present

## 2020-02-20 DIAGNOSIS — I4821 Permanent atrial fibrillation: Secondary | ICD-10-CM | POA: Diagnosis not present

## 2020-02-20 DIAGNOSIS — I700 Atherosclerosis of aorta: Secondary | ICD-10-CM | POA: Diagnosis not present

## 2020-02-20 DIAGNOSIS — J984 Other disorders of lung: Secondary | ICD-10-CM | POA: Diagnosis not present

## 2020-02-20 DIAGNOSIS — M79652 Pain in left thigh: Secondary | ICD-10-CM | POA: Diagnosis not present

## 2020-02-20 DIAGNOSIS — H9190 Unspecified hearing loss, unspecified ear: Secondary | ICD-10-CM | POA: Diagnosis not present

## 2020-02-20 DIAGNOSIS — M810 Age-related osteoporosis without current pathological fracture: Secondary | ICD-10-CM | POA: Diagnosis not present

## 2020-02-20 DIAGNOSIS — I129 Hypertensive chronic kidney disease with stage 1 through stage 4 chronic kidney disease, or unspecified chronic kidney disease: Secondary | ICD-10-CM | POA: Diagnosis not present

## 2020-02-20 DIAGNOSIS — S79912A Unspecified injury of left hip, initial encounter: Secondary | ICD-10-CM | POA: Diagnosis not present

## 2020-02-20 DIAGNOSIS — M79642 Pain in left hand: Secondary | ICD-10-CM | POA: Diagnosis not present

## 2020-02-20 DIAGNOSIS — M25512 Pain in left shoulder: Secondary | ICD-10-CM | POA: Diagnosis not present

## 2020-02-20 DIAGNOSIS — N179 Acute kidney failure, unspecified: Secondary | ICD-10-CM | POA: Diagnosis not present

## 2020-02-20 DIAGNOSIS — S0502XA Injury of conjunctiva and corneal abrasion without foreign body, left eye, initial encounter: Secondary | ICD-10-CM | POA: Diagnosis not present

## 2020-02-20 DIAGNOSIS — G0490 Encephalitis and encephalomyelitis, unspecified: Secondary | ICD-10-CM | POA: Diagnosis not present

## 2020-02-20 DIAGNOSIS — G8929 Other chronic pain: Secondary | ICD-10-CM | POA: Diagnosis not present

## 2020-02-20 DIAGNOSIS — S32409A Unspecified fracture of unspecified acetabulum, initial encounter for closed fracture: Secondary | ICD-10-CM | POA: Diagnosis not present

## 2020-02-20 DIAGNOSIS — M1990 Unspecified osteoarthritis, unspecified site: Secondary | ICD-10-CM | POA: Diagnosis not present

## 2020-02-20 DIAGNOSIS — Z8701 Personal history of pneumonia (recurrent): Secondary | ICD-10-CM | POA: Diagnosis not present

## 2020-02-20 DIAGNOSIS — E559 Vitamin D deficiency, unspecified: Secondary | ICD-10-CM | POA: Diagnosis not present

## 2020-02-20 DIAGNOSIS — K8020 Calculus of gallbladder without cholecystitis without obstruction: Secondary | ICD-10-CM | POA: Diagnosis not present

## 2020-02-20 DIAGNOSIS — M545 Low back pain: Secondary | ICD-10-CM | POA: Diagnosis not present

## 2020-02-20 DIAGNOSIS — M00011 Staphylococcal arthritis, right shoulder: Secondary | ICD-10-CM | POA: Diagnosis not present

## 2020-02-20 DIAGNOSIS — I9589 Other hypotension: Secondary | ICD-10-CM | POA: Diagnosis not present

## 2020-02-20 DIAGNOSIS — R42 Dizziness and giddiness: Secondary | ICD-10-CM | POA: Diagnosis not present

## 2020-02-20 DIAGNOSIS — J90 Pleural effusion, not elsewhere classified: Secondary | ICD-10-CM | POA: Diagnosis not present

## 2020-02-20 DIAGNOSIS — E1122 Type 2 diabetes mellitus with diabetic chronic kidney disease: Secondary | ICD-10-CM | POA: Diagnosis not present

## 2020-02-20 DIAGNOSIS — I63232 Cerebral infarction due to unspecified occlusion or stenosis of left carotid arteries: Secondary | ICD-10-CM | POA: Diagnosis not present

## 2020-02-20 DIAGNOSIS — Z8673 Personal history of transient ischemic attack (TIA), and cerebral infarction without residual deficits: Secondary | ICD-10-CM | POA: Diagnosis not present

## 2020-02-20 DIAGNOSIS — I470 Re-entry ventricular arrhythmia: Secondary | ICD-10-CM | POA: Diagnosis not present

## 2020-02-20 DIAGNOSIS — R102 Pelvic and perineal pain: Secondary | ICD-10-CM | POA: Diagnosis not present

## 2020-02-20 DIAGNOSIS — D638 Anemia in other chronic diseases classified elsewhere: Secondary | ICD-10-CM | POA: Diagnosis not present

## 2020-02-20 DIAGNOSIS — R040 Epistaxis: Secondary | ICD-10-CM | POA: Diagnosis not present

## 2020-02-20 DIAGNOSIS — Z951 Presence of aortocoronary bypass graft: Secondary | ICD-10-CM | POA: Diagnosis not present

## 2020-02-20 DIAGNOSIS — I469 Cardiac arrest, cause unspecified: Secondary | ICD-10-CM | POA: Diagnosis not present

## 2020-02-21 DIAGNOSIS — M9904 Segmental and somatic dysfunction of sacral region: Secondary | ICD-10-CM | POA: Diagnosis not present

## 2020-02-21 DIAGNOSIS — R2231 Localized swelling, mass and lump, right upper limb: Secondary | ICD-10-CM | POA: Diagnosis not present

## 2020-02-21 DIAGNOSIS — N202 Calculus of kidney with calculus of ureter: Secondary | ICD-10-CM | POA: Diagnosis not present

## 2020-02-21 DIAGNOSIS — S41101D Unspecified open wound of right upper arm, subsequent encounter: Secondary | ICD-10-CM | POA: Diagnosis not present

## 2020-02-21 DIAGNOSIS — E789 Disorder of lipoprotein metabolism, unspecified: Secondary | ICD-10-CM | POA: Diagnosis not present

## 2020-02-21 DIAGNOSIS — Z466 Encounter for fitting and adjustment of urinary device: Secondary | ICD-10-CM | POA: Diagnosis not present

## 2020-02-21 DIAGNOSIS — I491 Atrial premature depolarization: Secondary | ICD-10-CM | POA: Diagnosis not present

## 2020-02-21 DIAGNOSIS — M9902 Segmental and somatic dysfunction of thoracic region: Secondary | ICD-10-CM | POA: Diagnosis not present

## 2020-02-21 DIAGNOSIS — H11043 Peripheral pterygium, stationary, bilateral: Secondary | ICD-10-CM | POA: Diagnosis not present

## 2020-02-21 DIAGNOSIS — R1032 Left lower quadrant pain: Secondary | ICD-10-CM | POA: Diagnosis not present

## 2020-02-21 DIAGNOSIS — I6389 Other cerebral infarction: Secondary | ICD-10-CM | POA: Diagnosis not present

## 2020-02-21 DIAGNOSIS — K603 Anal fistula: Secondary | ICD-10-CM | POA: Diagnosis not present

## 2020-02-21 DIAGNOSIS — K254 Chronic or unspecified gastric ulcer with hemorrhage: Secondary | ICD-10-CM | POA: Diagnosis not present

## 2020-02-21 DIAGNOSIS — F445 Conversion disorder with seizures or convulsions: Secondary | ICD-10-CM | POA: Diagnosis not present

## 2020-02-21 DIAGNOSIS — J3089 Other allergic rhinitis: Secondary | ICD-10-CM | POA: Diagnosis not present

## 2020-02-21 DIAGNOSIS — R7302 Impaired glucose tolerance (oral): Secondary | ICD-10-CM | POA: Diagnosis not present

## 2020-02-21 DIAGNOSIS — H43821 Vitreomacular adhesion, right eye: Secondary | ICD-10-CM | POA: Diagnosis not present

## 2020-02-21 DIAGNOSIS — H61002 Unspecified perichondritis of left external ear: Secondary | ICD-10-CM | POA: Diagnosis not present

## 2020-02-21 DIAGNOSIS — M5033 Other cervical disc degeneration, cervicothoracic region: Secondary | ICD-10-CM | POA: Diagnosis not present

## 2020-02-21 DIAGNOSIS — R630 Anorexia: Secondary | ICD-10-CM | POA: Diagnosis not present

## 2020-02-21 DIAGNOSIS — F3132 Bipolar disorder, current episode depressed, moderate: Secondary | ICD-10-CM | POA: Diagnosis not present

## 2020-02-21 DIAGNOSIS — H0100A Unspecified blepharitis right eye, upper and lower eyelids: Secondary | ICD-10-CM | POA: Diagnosis not present

## 2020-02-21 DIAGNOSIS — D125 Benign neoplasm of sigmoid colon: Secondary | ICD-10-CM | POA: Diagnosis not present

## 2020-02-21 DIAGNOSIS — R066 Hiccough: Secondary | ICD-10-CM | POA: Diagnosis not present

## 2020-02-21 DIAGNOSIS — R911 Solitary pulmonary nodule: Secondary | ICD-10-CM | POA: Diagnosis not present

## 2020-02-21 DIAGNOSIS — I6522 Occlusion and stenosis of left carotid artery: Secondary | ICD-10-CM | POA: Diagnosis not present

## 2020-02-21 DIAGNOSIS — R112 Nausea with vomiting, unspecified: Secondary | ICD-10-CM | POA: Diagnosis not present

## 2020-02-21 DIAGNOSIS — M25461 Effusion, right knee: Secondary | ICD-10-CM | POA: Diagnosis not present

## 2020-02-21 DIAGNOSIS — Z8709 Personal history of other diseases of the respiratory system: Secondary | ICD-10-CM | POA: Diagnosis not present

## 2020-02-21 DIAGNOSIS — R609 Edema, unspecified: Secondary | ICD-10-CM | POA: Diagnosis not present

## 2020-02-21 DIAGNOSIS — J449 Chronic obstructive pulmonary disease, unspecified: Secondary | ICD-10-CM | POA: Diagnosis not present

## 2020-02-21 DIAGNOSIS — R0609 Other forms of dyspnea: Secondary | ICD-10-CM | POA: Diagnosis not present

## 2020-02-21 DIAGNOSIS — I87331 Chronic venous hypertension (idiopathic) with ulcer and inflammation of right lower extremity: Secondary | ICD-10-CM | POA: Diagnosis not present

## 2020-02-21 DIAGNOSIS — F5101 Primary insomnia: Secondary | ICD-10-CM | POA: Diagnosis not present

## 2020-02-21 DIAGNOSIS — R4701 Aphasia: Secondary | ICD-10-CM | POA: Diagnosis not present

## 2020-02-21 DIAGNOSIS — T25322A Burn of third degree of left foot, initial encounter: Secondary | ICD-10-CM | POA: Diagnosis not present

## 2020-02-21 DIAGNOSIS — D490 Neoplasm of unspecified behavior of digestive system: Secondary | ICD-10-CM | POA: Diagnosis not present

## 2020-02-21 DIAGNOSIS — F341 Dysthymic disorder: Secondary | ICD-10-CM | POA: Diagnosis not present

## 2020-02-21 DIAGNOSIS — H3530 Unspecified macular degeneration: Secondary | ICD-10-CM | POA: Diagnosis not present

## 2020-02-21 DIAGNOSIS — S72401G Unspecified fracture of lower end of right femur, subsequent encounter for closed fracture with delayed healing: Secondary | ICD-10-CM | POA: Diagnosis not present

## 2020-02-21 DIAGNOSIS — R29810 Facial weakness: Secondary | ICD-10-CM | POA: Diagnosis not present

## 2020-02-21 DIAGNOSIS — Z888 Allergy status to other drugs, medicaments and biological substances status: Secondary | ICD-10-CM | POA: Diagnosis not present

## 2020-02-21 DIAGNOSIS — I720 Aneurysm of carotid artery: Secondary | ICD-10-CM | POA: Diagnosis not present

## 2020-02-21 DIAGNOSIS — Z950 Presence of cardiac pacemaker: Secondary | ICD-10-CM | POA: Diagnosis not present

## 2020-02-21 DIAGNOSIS — R42 Dizziness and giddiness: Secondary | ICD-10-CM | POA: Diagnosis not present

## 2020-02-21 DIAGNOSIS — I82521 Chronic embolism and thrombosis of right iliac vein: Secondary | ICD-10-CM | POA: Diagnosis not present

## 2020-02-21 DIAGNOSIS — M222X1 Patellofemoral disorders, right knee: Secondary | ICD-10-CM | POA: Diagnosis not present

## 2020-02-21 DIAGNOSIS — I4510 Unspecified right bundle-branch block: Secondary | ICD-10-CM | POA: Diagnosis not present

## 2020-02-21 DIAGNOSIS — F331 Major depressive disorder, recurrent, moderate: Secondary | ICD-10-CM | POA: Diagnosis not present

## 2020-02-21 DIAGNOSIS — F3342 Major depressive disorder, recurrent, in full remission: Secondary | ICD-10-CM | POA: Diagnosis not present

## 2020-02-21 DIAGNOSIS — Z6823 Body mass index (BMI) 23.0-23.9, adult: Secondary | ICD-10-CM | POA: Diagnosis not present

## 2020-02-21 DIAGNOSIS — Z8371 Family history of colonic polyps: Secondary | ICD-10-CM | POA: Diagnosis not present

## 2020-02-21 DIAGNOSIS — H43811 Vitreous degeneration, right eye: Secondary | ICD-10-CM | POA: Diagnosis not present

## 2020-02-21 DIAGNOSIS — N1330 Unspecified hydronephrosis: Secondary | ICD-10-CM | POA: Diagnosis not present

## 2020-02-21 DIAGNOSIS — L02619 Cutaneous abscess of unspecified foot: Secondary | ICD-10-CM | POA: Diagnosis not present

## 2020-02-21 DIAGNOSIS — M5031 Other cervical disc degeneration,  high cervical region: Secondary | ICD-10-CM | POA: Diagnosis not present

## 2020-02-21 DIAGNOSIS — J9691 Respiratory failure, unspecified with hypoxia: Secondary | ICD-10-CM | POA: Diagnosis not present

## 2020-02-21 DIAGNOSIS — I7025 Atherosclerosis of native arteries of other extremities with ulceration: Secondary | ICD-10-CM | POA: Diagnosis not present

## 2020-02-21 DIAGNOSIS — N183 Chronic kidney disease, stage 3 unspecified: Secondary | ICD-10-CM | POA: Diagnosis not present

## 2020-02-21 DIAGNOSIS — D500 Iron deficiency anemia secondary to blood loss (chronic): Secondary | ICD-10-CM | POA: Diagnosis not present

## 2020-02-21 DIAGNOSIS — Z86718 Personal history of other venous thrombosis and embolism: Secondary | ICD-10-CM | POA: Diagnosis not present

## 2020-02-21 DIAGNOSIS — J90 Pleural effusion, not elsewhere classified: Secondary | ICD-10-CM | POA: Diagnosis not present

## 2020-02-21 DIAGNOSIS — Z7401 Bed confinement status: Secondary | ICD-10-CM | POA: Diagnosis not present

## 2020-02-21 DIAGNOSIS — J310 Chronic rhinitis: Secondary | ICD-10-CM | POA: Diagnosis not present

## 2020-02-21 DIAGNOSIS — E871 Hypo-osmolality and hyponatremia: Secondary | ICD-10-CM | POA: Diagnosis not present

## 2020-02-21 DIAGNOSIS — E291 Testicular hypofunction: Secondary | ICD-10-CM | POA: Diagnosis not present

## 2020-02-21 DIAGNOSIS — E538 Deficiency of other specified B group vitamins: Secondary | ICD-10-CM | POA: Diagnosis not present

## 2020-02-21 DIAGNOSIS — M25651 Stiffness of right hip, not elsewhere classified: Secondary | ICD-10-CM | POA: Diagnosis not present

## 2020-02-21 DIAGNOSIS — I8311 Varicose veins of right lower extremity with inflammation: Secondary | ICD-10-CM | POA: Diagnosis not present

## 2020-02-21 DIAGNOSIS — S3282XA Multiple fractures of pelvis without disruption of pelvic ring, initial encounter for closed fracture: Secondary | ICD-10-CM | POA: Diagnosis not present

## 2020-02-21 DIAGNOSIS — H0102A Squamous blepharitis right eye, upper and lower eyelids: Secondary | ICD-10-CM | POA: Diagnosis not present

## 2020-02-21 DIAGNOSIS — M1710 Unilateral primary osteoarthritis, unspecified knee: Secondary | ICD-10-CM | POA: Diagnosis not present

## 2020-02-21 DIAGNOSIS — M5134 Other intervertebral disc degeneration, thoracic region: Secondary | ICD-10-CM | POA: Diagnosis not present

## 2020-02-21 DIAGNOSIS — G3184 Mild cognitive impairment, so stated: Secondary | ICD-10-CM | POA: Diagnosis not present

## 2020-02-21 DIAGNOSIS — N62 Hypertrophy of breast: Secondary | ICD-10-CM | POA: Diagnosis not present

## 2020-02-21 DIAGNOSIS — I472 Ventricular tachycardia: Secondary | ICD-10-CM | POA: Diagnosis not present

## 2020-02-21 DIAGNOSIS — S32591D Other specified fracture of right pubis, subsequent encounter for fracture with routine healing: Secondary | ICD-10-CM | POA: Diagnosis not present

## 2020-02-21 DIAGNOSIS — T63441A Toxic effect of venom of bees, accidental (unintentional), initial encounter: Secondary | ICD-10-CM | POA: Diagnosis not present

## 2020-02-21 DIAGNOSIS — I4892 Unspecified atrial flutter: Secondary | ICD-10-CM | POA: Diagnosis not present

## 2020-02-21 DIAGNOSIS — J9811 Atelectasis: Secondary | ICD-10-CM | POA: Diagnosis not present

## 2020-02-21 DIAGNOSIS — E7800 Pure hypercholesterolemia, unspecified: Secondary | ICD-10-CM | POA: Diagnosis not present

## 2020-02-21 DIAGNOSIS — S8264XA Nondisplaced fracture of lateral malleolus of right fibula, initial encounter for closed fracture: Secondary | ICD-10-CM | POA: Diagnosis not present

## 2020-02-21 DIAGNOSIS — T07XXXA Unspecified multiple injuries, initial encounter: Secondary | ICD-10-CM | POA: Diagnosis not present

## 2020-02-21 DIAGNOSIS — F0281 Dementia in other diseases classified elsewhere with behavioral disturbance: Secondary | ICD-10-CM | POA: Diagnosis not present

## 2020-02-21 DIAGNOSIS — H3509 Other intraretinal microvascular abnormalities: Secondary | ICD-10-CM | POA: Diagnosis not present

## 2020-02-21 DIAGNOSIS — G5601 Carpal tunnel syndrome, right upper limb: Secondary | ICD-10-CM | POA: Diagnosis not present

## 2020-02-21 DIAGNOSIS — G301 Alzheimer's disease with late onset: Secondary | ICD-10-CM | POA: Diagnosis not present

## 2020-02-21 DIAGNOSIS — R4182 Altered mental status, unspecified: Secondary | ICD-10-CM | POA: Diagnosis not present

## 2020-02-21 DIAGNOSIS — I728 Aneurysm of other specified arteries: Secondary | ICD-10-CM | POA: Diagnosis not present

## 2020-02-21 DIAGNOSIS — G4719 Other hypersomnia: Secondary | ICD-10-CM | POA: Diagnosis not present

## 2020-02-21 DIAGNOSIS — Z131 Encounter for screening for diabetes mellitus: Secondary | ICD-10-CM | POA: Diagnosis not present

## 2020-02-21 DIAGNOSIS — M48062 Spinal stenosis, lumbar region with neurogenic claudication: Secondary | ICD-10-CM | POA: Diagnosis not present

## 2020-02-21 DIAGNOSIS — M65341 Trigger finger, right ring finger: Secondary | ICD-10-CM | POA: Diagnosis not present

## 2020-02-21 DIAGNOSIS — J441 Chronic obstructive pulmonary disease with (acute) exacerbation: Secondary | ICD-10-CM | POA: Diagnosis not present

## 2020-02-21 DIAGNOSIS — Z9229 Personal history of other drug therapy: Secondary | ICD-10-CM | POA: Diagnosis not present

## 2020-02-21 DIAGNOSIS — H35362 Drusen (degenerative) of macula, left eye: Secondary | ICD-10-CM | POA: Diagnosis not present

## 2020-02-21 DIAGNOSIS — Z880 Allergy status to penicillin: Secondary | ICD-10-CM | POA: Diagnosis not present

## 2020-02-21 DIAGNOSIS — H10413 Chronic giant papillary conjunctivitis, bilateral: Secondary | ICD-10-CM | POA: Diagnosis not present

## 2020-02-21 DIAGNOSIS — M25061 Hemarthrosis, right knee: Secondary | ICD-10-CM | POA: Diagnosis not present

## 2020-02-21 DIAGNOSIS — H4010X0 Unspecified open-angle glaucoma, stage unspecified: Secondary | ICD-10-CM | POA: Diagnosis not present

## 2020-02-21 DIAGNOSIS — M5442 Lumbago with sciatica, left side: Secondary | ICD-10-CM | POA: Diagnosis not present

## 2020-02-21 DIAGNOSIS — Z86711 Personal history of pulmonary embolism: Secondary | ICD-10-CM | POA: Diagnosis not present

## 2020-02-21 DIAGNOSIS — Z433 Encounter for attention to colostomy: Secondary | ICD-10-CM | POA: Diagnosis not present

## 2020-02-21 DIAGNOSIS — M65342 Trigger finger, left ring finger: Secondary | ICD-10-CM | POA: Diagnosis not present

## 2020-02-21 DIAGNOSIS — A419 Sepsis, unspecified organism: Secondary | ICD-10-CM | POA: Diagnosis not present

## 2020-02-21 DIAGNOSIS — D2261 Melanocytic nevi of right upper limb, including shoulder: Secondary | ICD-10-CM | POA: Diagnosis not present

## 2020-02-21 DIAGNOSIS — E1169 Type 2 diabetes mellitus with other specified complication: Secondary | ICD-10-CM | POA: Diagnosis not present

## 2020-02-21 DIAGNOSIS — H11153 Pinguecula, bilateral: Secondary | ICD-10-CM | POA: Diagnosis not present

## 2020-02-21 DIAGNOSIS — I083 Combined rheumatic disorders of mitral, aortic and tricuspid valves: Secondary | ICD-10-CM | POA: Diagnosis not present

## 2020-02-21 DIAGNOSIS — Z5112 Encounter for antineoplastic immunotherapy: Secondary | ICD-10-CM | POA: Diagnosis not present

## 2020-02-21 DIAGNOSIS — C8203 Follicular lymphoma grade I, intra-abdominal lymph nodes: Secondary | ICD-10-CM | POA: Diagnosis not present

## 2020-02-21 DIAGNOSIS — M2560 Stiffness of unspecified joint, not elsewhere classified: Secondary | ICD-10-CM | POA: Diagnosis not present

## 2020-02-21 DIAGNOSIS — I872 Venous insufficiency (chronic) (peripheral): Secondary | ICD-10-CM | POA: Diagnosis not present

## 2020-02-21 DIAGNOSIS — R569 Unspecified convulsions: Secondary | ICD-10-CM | POA: Diagnosis not present

## 2020-02-21 DIAGNOSIS — Z515 Encounter for palliative care: Secondary | ICD-10-CM | POA: Diagnosis not present

## 2020-02-21 DIAGNOSIS — F4320 Adjustment disorder, unspecified: Secondary | ICD-10-CM | POA: Diagnosis not present

## 2020-02-21 DIAGNOSIS — M961 Postlaminectomy syndrome, not elsewhere classified: Secondary | ICD-10-CM | POA: Diagnosis not present

## 2020-02-21 DIAGNOSIS — R778 Other specified abnormalities of plasma proteins: Secondary | ICD-10-CM | POA: Diagnosis not present

## 2020-02-21 DIAGNOSIS — N323 Diverticulum of bladder: Secondary | ICD-10-CM | POA: Diagnosis not present

## 2020-02-21 DIAGNOSIS — R310 Gross hematuria: Secondary | ICD-10-CM | POA: Diagnosis not present

## 2020-02-21 DIAGNOSIS — R1110 Vomiting, unspecified: Secondary | ICD-10-CM | POA: Diagnosis not present

## 2020-02-21 DIAGNOSIS — T63451D Toxic effect of venom of hornets, accidental (unintentional), subsequent encounter: Secondary | ICD-10-CM | POA: Diagnosis not present

## 2020-02-21 DIAGNOSIS — R000 Tachycardia, unspecified: Secondary | ICD-10-CM | POA: Diagnosis not present

## 2020-02-21 DIAGNOSIS — G893 Neoplasm related pain (acute) (chronic): Secondary | ICD-10-CM | POA: Diagnosis not present

## 2020-02-21 DIAGNOSIS — H44521 Atrophy of globe, right eye: Secondary | ICD-10-CM | POA: Diagnosis not present

## 2020-02-21 DIAGNOSIS — E1129 Type 2 diabetes mellitus with other diabetic kidney complication: Secondary | ICD-10-CM | POA: Diagnosis not present

## 2020-02-21 DIAGNOSIS — I517 Cardiomegaly: Secondary | ICD-10-CM | POA: Diagnosis not present

## 2020-02-21 DIAGNOSIS — I051 Rheumatic mitral insufficiency: Secondary | ICD-10-CM | POA: Diagnosis not present

## 2020-02-21 DIAGNOSIS — R29728 NIHSS score 28: Secondary | ICD-10-CM | POA: Diagnosis not present

## 2020-02-21 DIAGNOSIS — L989 Disorder of the skin and subcutaneous tissue, unspecified: Secondary | ICD-10-CM | POA: Diagnosis not present

## 2020-02-21 DIAGNOSIS — R791 Abnormal coagulation profile: Secondary | ICD-10-CM | POA: Diagnosis not present

## 2020-02-21 DIAGNOSIS — J1282 Pneumonia due to coronavirus disease 2019: Secondary | ICD-10-CM | POA: Diagnosis not present

## 2020-02-21 DIAGNOSIS — I25118 Atherosclerotic heart disease of native coronary artery with other forms of angina pectoris: Secondary | ICD-10-CM | POA: Diagnosis not present

## 2020-02-21 DIAGNOSIS — R8290 Unspecified abnormal findings in urine: Secondary | ICD-10-CM | POA: Diagnosis not present

## 2020-02-21 DIAGNOSIS — G459 Transient cerebral ischemic attack, unspecified: Secondary | ICD-10-CM | POA: Diagnosis not present

## 2020-02-21 DIAGNOSIS — H9193 Unspecified hearing loss, bilateral: Secondary | ICD-10-CM | POA: Diagnosis not present

## 2020-02-21 DIAGNOSIS — D3132 Benign neoplasm of left choroid: Secondary | ICD-10-CM | POA: Diagnosis not present

## 2020-02-21 DIAGNOSIS — R456 Violent behavior: Secondary | ICD-10-CM | POA: Diagnosis not present

## 2020-02-21 DIAGNOSIS — L97822 Non-pressure chronic ulcer of other part of left lower leg with fat layer exposed: Secondary | ICD-10-CM | POA: Diagnosis not present

## 2020-02-21 DIAGNOSIS — F339 Major depressive disorder, recurrent, unspecified: Secondary | ICD-10-CM | POA: Diagnosis not present

## 2020-02-21 DIAGNOSIS — Z6820 Body mass index (BMI) 20.0-20.9, adult: Secondary | ICD-10-CM | POA: Diagnosis not present

## 2020-02-21 DIAGNOSIS — Z87442 Personal history of urinary calculi: Secondary | ICD-10-CM | POA: Diagnosis not present

## 2020-02-21 DIAGNOSIS — I480 Paroxysmal atrial fibrillation: Secondary | ICD-10-CM | POA: Diagnosis not present

## 2020-02-21 DIAGNOSIS — M5432 Sciatica, left side: Secondary | ICD-10-CM | POA: Diagnosis not present

## 2020-02-21 DIAGNOSIS — M7989 Other specified soft tissue disorders: Secondary | ICD-10-CM | POA: Diagnosis not present

## 2020-02-21 DIAGNOSIS — I361 Nonrheumatic tricuspid (valve) insufficiency: Secondary | ICD-10-CM | POA: Diagnosis not present

## 2020-02-21 DIAGNOSIS — F1411 Cocaine abuse, in remission: Secondary | ICD-10-CM | POA: Diagnosis not present

## 2020-02-21 DIAGNOSIS — M5417 Radiculopathy, lumbosacral region: Secondary | ICD-10-CM | POA: Diagnosis not present

## 2020-02-21 DIAGNOSIS — L821 Other seborrheic keratosis: Secondary | ICD-10-CM | POA: Diagnosis not present

## 2020-02-21 DIAGNOSIS — H259 Unspecified age-related cataract: Secondary | ICD-10-CM | POA: Diagnosis not present

## 2020-02-21 DIAGNOSIS — E1122 Type 2 diabetes mellitus with diabetic chronic kidney disease: Secondary | ICD-10-CM | POA: Diagnosis not present

## 2020-02-21 DIAGNOSIS — H9191 Unspecified hearing loss, right ear: Secondary | ICD-10-CM | POA: Diagnosis not present

## 2020-02-21 DIAGNOSIS — C4491 Basal cell carcinoma of skin, unspecified: Secondary | ICD-10-CM | POA: Diagnosis not present

## 2020-02-21 DIAGNOSIS — E8770 Fluid overload, unspecified: Secondary | ICD-10-CM | POA: Diagnosis not present

## 2020-02-21 DIAGNOSIS — Z9481 Bone marrow transplant status: Secondary | ICD-10-CM | POA: Diagnosis not present

## 2020-02-21 DIAGNOSIS — R921 Mammographic calcification found on diagnostic imaging of breast: Secondary | ICD-10-CM | POA: Diagnosis not present

## 2020-02-21 DIAGNOSIS — Z85828 Personal history of other malignant neoplasm of skin: Secondary | ICD-10-CM | POA: Diagnosis not present

## 2020-02-21 DIAGNOSIS — M75102 Unspecified rotator cuff tear or rupture of left shoulder, not specified as traumatic: Secondary | ICD-10-CM | POA: Diagnosis not present

## 2020-02-21 DIAGNOSIS — R221 Localized swelling, mass and lump, neck: Secondary | ICD-10-CM | POA: Diagnosis not present

## 2020-02-21 DIAGNOSIS — M25662 Stiffness of left knee, not elsewhere classified: Secondary | ICD-10-CM | POA: Diagnosis not present

## 2020-02-21 DIAGNOSIS — F4321 Adjustment disorder with depressed mood: Secondary | ICD-10-CM | POA: Diagnosis not present

## 2020-02-21 DIAGNOSIS — E860 Dehydration: Secondary | ICD-10-CM | POA: Diagnosis not present

## 2020-02-21 DIAGNOSIS — N186 End stage renal disease: Secondary | ICD-10-CM | POA: Diagnosis not present

## 2020-02-21 DIAGNOSIS — S7002XA Contusion of left hip, initial encounter: Secondary | ICD-10-CM | POA: Diagnosis not present

## 2020-02-21 DIAGNOSIS — I871 Compression of vein: Secondary | ICD-10-CM | POA: Diagnosis not present

## 2020-02-21 DIAGNOSIS — S299XXA Unspecified injury of thorax, initial encounter: Secondary | ICD-10-CM | POA: Diagnosis not present

## 2020-02-21 DIAGNOSIS — M25652 Stiffness of left hip, not elsewhere classified: Secondary | ICD-10-CM | POA: Diagnosis not present

## 2020-02-21 DIAGNOSIS — M25551 Pain in right hip: Secondary | ICD-10-CM | POA: Diagnosis not present

## 2020-02-21 DIAGNOSIS — R590 Localized enlarged lymph nodes: Secondary | ICD-10-CM | POA: Diagnosis not present

## 2020-02-21 DIAGNOSIS — G8918 Other acute postprocedural pain: Secondary | ICD-10-CM | POA: Diagnosis not present

## 2020-02-21 DIAGNOSIS — S91301A Unspecified open wound, right foot, initial encounter: Secondary | ICD-10-CM | POA: Diagnosis not present

## 2020-02-21 DIAGNOSIS — M542 Cervicalgia: Secondary | ICD-10-CM | POA: Diagnosis not present

## 2020-02-21 DIAGNOSIS — M519 Unspecified thoracic, thoracolumbar and lumbosacral intervertebral disc disorder: Secondary | ICD-10-CM | POA: Diagnosis not present

## 2020-02-21 DIAGNOSIS — F418 Other specified anxiety disorders: Secondary | ICD-10-CM | POA: Diagnosis not present

## 2020-02-21 DIAGNOSIS — F17200 Nicotine dependence, unspecified, uncomplicated: Secondary | ICD-10-CM | POA: Diagnosis not present

## 2020-02-21 DIAGNOSIS — T464X5A Adverse effect of angiotensin-converting-enzyme inhibitors, initial encounter: Secondary | ICD-10-CM | POA: Diagnosis not present

## 2020-02-21 DIAGNOSIS — L03311 Cellulitis of abdominal wall: Secondary | ICD-10-CM | POA: Diagnosis not present

## 2020-02-21 DIAGNOSIS — H43813 Vitreous degeneration, bilateral: Secondary | ICD-10-CM | POA: Diagnosis not present

## 2020-02-21 DIAGNOSIS — R251 Tremor, unspecified: Secondary | ICD-10-CM | POA: Diagnosis not present

## 2020-02-21 DIAGNOSIS — G8929 Other chronic pain: Secondary | ICD-10-CM | POA: Diagnosis not present

## 2020-02-21 DIAGNOSIS — E861 Hypovolemia: Secondary | ICD-10-CM | POA: Diagnosis not present

## 2020-02-21 DIAGNOSIS — I4821 Permanent atrial fibrillation: Secondary | ICD-10-CM | POA: Diagnosis not present

## 2020-02-21 DIAGNOSIS — H401111 Primary open-angle glaucoma, right eye, mild stage: Secondary | ICD-10-CM | POA: Diagnosis not present

## 2020-02-21 DIAGNOSIS — G501 Atypical facial pain: Secondary | ICD-10-CM | POA: Diagnosis not present

## 2020-02-21 DIAGNOSIS — R239 Unspecified skin changes: Secondary | ICD-10-CM | POA: Diagnosis not present

## 2020-02-21 DIAGNOSIS — R252 Cramp and spasm: Secondary | ICD-10-CM | POA: Diagnosis not present

## 2020-02-21 DIAGNOSIS — N644 Mastodynia: Secondary | ICD-10-CM | POA: Diagnosis not present

## 2020-02-21 DIAGNOSIS — I110 Hypertensive heart disease with heart failure: Secondary | ICD-10-CM | POA: Diagnosis not present

## 2020-02-21 DIAGNOSIS — Z09 Encounter for follow-up examination after completed treatment for conditions other than malignant neoplasm: Secondary | ICD-10-CM | POA: Diagnosis not present

## 2020-02-21 DIAGNOSIS — I130 Hypertensive heart and chronic kidney disease with heart failure and stage 1 through stage 4 chronic kidney disease, or unspecified chronic kidney disease: Secondary | ICD-10-CM | POA: Diagnosis not present

## 2020-02-21 DIAGNOSIS — R627 Adult failure to thrive: Secondary | ICD-10-CM | POA: Diagnosis not present

## 2020-02-21 DIAGNOSIS — I272 Pulmonary hypertension, unspecified: Secondary | ICD-10-CM | POA: Diagnosis not present

## 2020-02-21 DIAGNOSIS — Z8679 Personal history of other diseases of the circulatory system: Secondary | ICD-10-CM | POA: Diagnosis not present

## 2020-02-21 DIAGNOSIS — R200 Anesthesia of skin: Secondary | ICD-10-CM | POA: Diagnosis not present

## 2020-02-21 DIAGNOSIS — M7061 Trochanteric bursitis, right hip: Secondary | ICD-10-CM | POA: Diagnosis not present

## 2020-02-21 DIAGNOSIS — R918 Other nonspecific abnormal finding of lung field: Secondary | ICD-10-CM | POA: Diagnosis not present

## 2020-02-21 DIAGNOSIS — R1310 Dysphagia, unspecified: Secondary | ICD-10-CM | POA: Diagnosis not present

## 2020-02-21 DIAGNOSIS — Z87440 Personal history of urinary (tract) infections: Secondary | ICD-10-CM | POA: Diagnosis not present

## 2020-02-21 DIAGNOSIS — R109 Unspecified abdominal pain: Secondary | ICD-10-CM | POA: Diagnosis not present

## 2020-02-21 DIAGNOSIS — M810 Age-related osteoporosis without current pathological fracture: Secondary | ICD-10-CM | POA: Diagnosis not present

## 2020-02-21 DIAGNOSIS — K209 Esophagitis, unspecified without bleeding: Secondary | ICD-10-CM | POA: Diagnosis not present

## 2020-02-21 DIAGNOSIS — R202 Paresthesia of skin: Secondary | ICD-10-CM | POA: Diagnosis not present

## 2020-02-21 DIAGNOSIS — T63481D Toxic effect of venom of other arthropod, accidental (unintentional), subsequent encounter: Secondary | ICD-10-CM | POA: Diagnosis not present

## 2020-02-21 DIAGNOSIS — D6869 Other thrombophilia: Secondary | ICD-10-CM | POA: Diagnosis not present

## 2020-02-21 DIAGNOSIS — K649 Unspecified hemorrhoids: Secondary | ICD-10-CM | POA: Diagnosis not present

## 2020-02-21 DIAGNOSIS — Z86010 Personal history of colonic polyps: Secondary | ICD-10-CM | POA: Diagnosis not present

## 2020-02-21 DIAGNOSIS — M5032 Other cervical disc degeneration, mid-cervical region, unspecified level: Secondary | ICD-10-CM | POA: Diagnosis not present

## 2020-02-21 DIAGNOSIS — J069 Acute upper respiratory infection, unspecified: Secondary | ICD-10-CM | POA: Diagnosis not present

## 2020-02-21 DIAGNOSIS — M0579 Rheumatoid arthritis with rheumatoid factor of multiple sites without organ or systems involvement: Secondary | ICD-10-CM | POA: Diagnosis not present

## 2020-02-21 DIAGNOSIS — D485 Neoplasm of uncertain behavior of skin: Secondary | ICD-10-CM | POA: Diagnosis not present

## 2020-02-21 DIAGNOSIS — D0511 Intraductal carcinoma in situ of right breast: Secondary | ICD-10-CM | POA: Diagnosis not present

## 2020-02-21 DIAGNOSIS — C44712 Basal cell carcinoma of skin of right lower limb, including hip: Secondary | ICD-10-CM | POA: Diagnosis not present

## 2020-02-21 DIAGNOSIS — I482 Chronic atrial fibrillation, unspecified: Secondary | ICD-10-CM | POA: Diagnosis not present

## 2020-02-21 DIAGNOSIS — G6289 Other specified polyneuropathies: Secondary | ICD-10-CM | POA: Diagnosis not present

## 2020-02-21 DIAGNOSIS — L97521 Non-pressure chronic ulcer of other part of left foot limited to breakdown of skin: Secondary | ICD-10-CM | POA: Diagnosis not present

## 2020-02-21 DIAGNOSIS — D071 Carcinoma in situ of vulva: Secondary | ICD-10-CM | POA: Diagnosis not present

## 2020-02-21 DIAGNOSIS — R350 Frequency of micturition: Secondary | ICD-10-CM | POA: Diagnosis not present

## 2020-02-21 DIAGNOSIS — E1140 Type 2 diabetes mellitus with diabetic neuropathy, unspecified: Secondary | ICD-10-CM | POA: Diagnosis not present

## 2020-02-21 DIAGNOSIS — J3081 Allergic rhinitis due to animal (cat) (dog) hair and dander: Secondary | ICD-10-CM | POA: Diagnosis not present

## 2020-02-21 DIAGNOSIS — R932 Abnormal findings on diagnostic imaging of liver and biliary tract: Secondary | ICD-10-CM | POA: Diagnosis not present

## 2020-02-21 DIAGNOSIS — D638 Anemia in other chronic diseases classified elsewhere: Secondary | ICD-10-CM | POA: Diagnosis not present

## 2020-02-21 DIAGNOSIS — F4322 Adjustment disorder with anxiety: Secondary | ICD-10-CM | POA: Diagnosis not present

## 2020-02-21 DIAGNOSIS — L239 Allergic contact dermatitis, unspecified cause: Secondary | ICD-10-CM | POA: Diagnosis not present

## 2020-02-21 DIAGNOSIS — S32049A Unspecified fracture of fourth lumbar vertebra, initial encounter for closed fracture: Secondary | ICD-10-CM | POA: Diagnosis not present

## 2020-02-21 DIAGNOSIS — I469 Cardiac arrest, cause unspecified: Secondary | ICD-10-CM | POA: Diagnosis not present

## 2020-02-21 DIAGNOSIS — M79642 Pain in left hand: Secondary | ICD-10-CM | POA: Diagnosis not present

## 2020-02-21 DIAGNOSIS — I651 Occlusion and stenosis of basilar artery: Secondary | ICD-10-CM | POA: Diagnosis not present

## 2020-02-21 DIAGNOSIS — E875 Hyperkalemia: Secondary | ICD-10-CM | POA: Diagnosis not present

## 2020-02-21 DIAGNOSIS — Z974 Presence of external hearing-aid: Secondary | ICD-10-CM | POA: Diagnosis not present

## 2020-02-21 DIAGNOSIS — R39198 Other difficulties with micturition: Secondary | ICD-10-CM | POA: Diagnosis not present

## 2020-02-21 DIAGNOSIS — J910 Malignant pleural effusion: Secondary | ICD-10-CM | POA: Diagnosis not present

## 2020-02-21 DIAGNOSIS — R5381 Other malaise: Secondary | ICD-10-CM | POA: Diagnosis not present

## 2020-02-21 DIAGNOSIS — M47814 Spondylosis without myelopathy or radiculopathy, thoracic region: Secondary | ICD-10-CM | POA: Diagnosis not present

## 2020-02-21 DIAGNOSIS — S42022A Displaced fracture of shaft of left clavicle, initial encounter for closed fracture: Secondary | ICD-10-CM | POA: Diagnosis not present

## 2020-02-21 DIAGNOSIS — J309 Allergic rhinitis, unspecified: Secondary | ICD-10-CM | POA: Diagnosis not present

## 2020-02-21 DIAGNOSIS — L97509 Non-pressure chronic ulcer of other part of unspecified foot with unspecified severity: Secondary | ICD-10-CM | POA: Diagnosis not present

## 2020-02-21 DIAGNOSIS — C9001 Multiple myeloma in remission: Secondary | ICD-10-CM | POA: Diagnosis not present

## 2020-02-21 DIAGNOSIS — M4804 Spinal stenosis, thoracic region: Secondary | ICD-10-CM | POA: Diagnosis not present

## 2020-02-21 DIAGNOSIS — R293 Abnormal posture: Secondary | ICD-10-CM | POA: Diagnosis not present

## 2020-02-21 DIAGNOSIS — Z0189 Encounter for other specified special examinations: Secondary | ICD-10-CM | POA: Diagnosis not present

## 2020-02-21 DIAGNOSIS — M4854XA Collapsed vertebra, not elsewhere classified, thoracic region, initial encounter for fracture: Secondary | ICD-10-CM | POA: Diagnosis not present

## 2020-02-21 DIAGNOSIS — Z9842 Cataract extraction status, left eye: Secondary | ICD-10-CM | POA: Diagnosis not present

## 2020-02-21 DIAGNOSIS — M5414 Radiculopathy, thoracic region: Secondary | ICD-10-CM | POA: Diagnosis not present

## 2020-02-21 DIAGNOSIS — R7881 Bacteremia: Secondary | ICD-10-CM | POA: Diagnosis not present

## 2020-02-21 DIAGNOSIS — F321 Major depressive disorder, single episode, moderate: Secondary | ICD-10-CM | POA: Diagnosis not present

## 2020-02-21 DIAGNOSIS — I341 Nonrheumatic mitral (valve) prolapse: Secondary | ICD-10-CM | POA: Diagnosis not present

## 2020-02-21 DIAGNOSIS — I340 Nonrheumatic mitral (valve) insufficiency: Secondary | ICD-10-CM | POA: Diagnosis not present

## 2020-02-21 DIAGNOSIS — R410 Disorientation, unspecified: Secondary | ICD-10-CM | POA: Diagnosis not present

## 2020-02-21 DIAGNOSIS — R296 Repeated falls: Secondary | ICD-10-CM | POA: Diagnosis not present

## 2020-02-21 DIAGNOSIS — J324 Chronic pansinusitis: Secondary | ICD-10-CM | POA: Diagnosis not present

## 2020-02-21 DIAGNOSIS — C84A8 Cutaneous T-cell lymphoma, unspecified, lymph nodes of multiple sites: Secondary | ICD-10-CM | POA: Diagnosis not present

## 2020-02-21 DIAGNOSIS — Z954 Presence of other heart-valve replacement: Secondary | ICD-10-CM | POA: Diagnosis not present

## 2020-02-21 DIAGNOSIS — J479 Bronchiectasis, uncomplicated: Secondary | ICD-10-CM | POA: Diagnosis not present

## 2020-02-21 DIAGNOSIS — S065X9A Traumatic subdural hemorrhage with loss of consciousness of unspecified duration, initial encounter: Secondary | ICD-10-CM | POA: Diagnosis not present

## 2020-02-21 DIAGNOSIS — G5732 Lesion of lateral popliteal nerve, left lower limb: Secondary | ICD-10-CM | POA: Diagnosis not present

## 2020-02-21 DIAGNOSIS — C538 Malignant neoplasm of overlapping sites of cervix uteri: Secondary | ICD-10-CM | POA: Diagnosis not present

## 2020-02-21 DIAGNOSIS — Z08 Encounter for follow-up examination after completed treatment for malignant neoplasm: Secondary | ICD-10-CM | POA: Diagnosis not present

## 2020-02-21 DIAGNOSIS — M4322 Fusion of spine, cervical region: Secondary | ICD-10-CM | POA: Diagnosis not present

## 2020-02-21 DIAGNOSIS — F39 Unspecified mood [affective] disorder: Secondary | ICD-10-CM | POA: Diagnosis not present

## 2020-02-21 DIAGNOSIS — H44001 Unspecified purulent endophthalmitis, right eye: Secondary | ICD-10-CM | POA: Diagnosis not present

## 2020-02-21 DIAGNOSIS — Z7982 Long term (current) use of aspirin: Secondary | ICD-10-CM | POA: Diagnosis not present

## 2020-02-21 DIAGNOSIS — Z89512 Acquired absence of left leg below knee: Secondary | ICD-10-CM | POA: Diagnosis not present

## 2020-02-21 DIAGNOSIS — J342 Deviated nasal septum: Secondary | ICD-10-CM | POA: Diagnosis not present

## 2020-02-21 DIAGNOSIS — L97421 Non-pressure chronic ulcer of left heel and midfoot limited to breakdown of skin: Secondary | ICD-10-CM | POA: Diagnosis not present

## 2020-02-21 DIAGNOSIS — D6489 Other specified anemias: Secondary | ICD-10-CM | POA: Diagnosis not present

## 2020-02-21 DIAGNOSIS — H25812 Combined forms of age-related cataract, left eye: Secondary | ICD-10-CM | POA: Diagnosis not present

## 2020-02-21 DIAGNOSIS — H2511 Age-related nuclear cataract, right eye: Secondary | ICD-10-CM | POA: Diagnosis not present

## 2020-02-21 DIAGNOSIS — S2241XD Multiple fractures of ribs, right side, subsequent encounter for fracture with routine healing: Secondary | ICD-10-CM | POA: Diagnosis not present

## 2020-02-21 DIAGNOSIS — C8528 Mediastinal (thymic) large B-cell lymphoma, lymph nodes of multiple sites: Secondary | ICD-10-CM | POA: Diagnosis not present

## 2020-02-21 DIAGNOSIS — F410 Panic disorder [episodic paroxysmal anxiety] without agoraphobia: Secondary | ICD-10-CM | POA: Diagnosis not present

## 2020-02-21 DIAGNOSIS — I7101 Dissection of thoracic aorta: Secondary | ICD-10-CM | POA: Diagnosis not present

## 2020-02-21 DIAGNOSIS — H539 Unspecified visual disturbance: Secondary | ICD-10-CM | POA: Diagnosis not present

## 2020-02-21 DIAGNOSIS — R443 Hallucinations, unspecified: Secondary | ICD-10-CM | POA: Diagnosis not present

## 2020-02-21 DIAGNOSIS — K921 Melena: Secondary | ICD-10-CM | POA: Diagnosis not present

## 2020-02-21 DIAGNOSIS — T18128A Food in esophagus causing other injury, initial encounter: Secondary | ICD-10-CM | POA: Diagnosis not present

## 2020-02-21 DIAGNOSIS — G9349 Other encephalopathy: Secondary | ICD-10-CM | POA: Diagnosis not present

## 2020-02-21 DIAGNOSIS — H01026 Squamous blepharitis left eye, unspecified eyelid: Secondary | ICD-10-CM | POA: Diagnosis not present

## 2020-02-21 DIAGNOSIS — Z96642 Presence of left artificial hip joint: Secondary | ICD-10-CM | POA: Diagnosis not present

## 2020-02-21 DIAGNOSIS — H353132 Nonexudative age-related macular degeneration, bilateral, intermediate dry stage: Secondary | ICD-10-CM | POA: Diagnosis not present

## 2020-02-21 DIAGNOSIS — D62 Acute posthemorrhagic anemia: Secondary | ICD-10-CM | POA: Diagnosis not present

## 2020-02-21 DIAGNOSIS — Z1589 Genetic susceptibility to other disease: Secondary | ICD-10-CM | POA: Diagnosis not present

## 2020-02-21 DIAGNOSIS — R7309 Other abnormal glucose: Secondary | ICD-10-CM | POA: Diagnosis not present

## 2020-02-21 DIAGNOSIS — M8588 Other specified disorders of bone density and structure, other site: Secondary | ICD-10-CM | POA: Diagnosis not present

## 2020-02-21 DIAGNOSIS — M6530 Trigger finger, unspecified finger: Secondary | ICD-10-CM | POA: Diagnosis not present

## 2020-02-21 DIAGNOSIS — K644 Residual hemorrhoidal skin tags: Secondary | ICD-10-CM | POA: Diagnosis not present

## 2020-02-21 DIAGNOSIS — M26621 Arthralgia of right temporomandibular joint: Secondary | ICD-10-CM | POA: Diagnosis not present

## 2020-02-21 DIAGNOSIS — C220 Liver cell carcinoma: Secondary | ICD-10-CM | POA: Diagnosis not present

## 2020-02-21 DIAGNOSIS — H338 Other retinal detachments: Secondary | ICD-10-CM | POA: Diagnosis not present

## 2020-02-21 DIAGNOSIS — H18453 Nodular corneal degeneration, bilateral: Secondary | ICD-10-CM | POA: Diagnosis not present

## 2020-02-21 DIAGNOSIS — M7020 Olecranon bursitis, unspecified elbow: Secondary | ICD-10-CM | POA: Diagnosis not present

## 2020-02-21 DIAGNOSIS — D518 Other vitamin B12 deficiency anemias: Secondary | ICD-10-CM | POA: Diagnosis not present

## 2020-02-21 DIAGNOSIS — I10 Essential (primary) hypertension: Secondary | ICD-10-CM | POA: Diagnosis not present

## 2020-02-21 DIAGNOSIS — G500 Trigeminal neuralgia: Secondary | ICD-10-CM | POA: Diagnosis not present

## 2020-02-21 DIAGNOSIS — N3090 Cystitis, unspecified without hematuria: Secondary | ICD-10-CM | POA: Diagnosis not present

## 2020-02-21 DIAGNOSIS — R413 Other amnesia: Secondary | ICD-10-CM | POA: Diagnosis not present

## 2020-02-21 DIAGNOSIS — M19071 Primary osteoarthritis, right ankle and foot: Secondary | ICD-10-CM | POA: Diagnosis not present

## 2020-02-21 DIAGNOSIS — H3581 Retinal edema: Secondary | ICD-10-CM | POA: Diagnosis not present

## 2020-02-21 DIAGNOSIS — Z006 Encounter for examination for normal comparison and control in clinical research program: Secondary | ICD-10-CM | POA: Diagnosis not present

## 2020-02-21 DIAGNOSIS — L820 Inflamed seborrheic keratosis: Secondary | ICD-10-CM | POA: Diagnosis not present

## 2020-02-21 DIAGNOSIS — E11622 Type 2 diabetes mellitus with other skin ulcer: Secondary | ICD-10-CM | POA: Diagnosis not present

## 2020-02-21 DIAGNOSIS — M064 Inflammatory polyarthropathy: Secondary | ICD-10-CM | POA: Diagnosis not present

## 2020-02-21 DIAGNOSIS — Z952 Presence of prosthetic heart valve: Secondary | ICD-10-CM | POA: Diagnosis not present

## 2020-02-21 DIAGNOSIS — H401122 Primary open-angle glaucoma, left eye, moderate stage: Secondary | ICD-10-CM | POA: Diagnosis not present

## 2020-02-21 DIAGNOSIS — C3490 Malignant neoplasm of unspecified part of unspecified bronchus or lung: Secondary | ICD-10-CM | POA: Diagnosis not present

## 2020-02-21 DIAGNOSIS — M818 Other osteoporosis without current pathological fracture: Secondary | ICD-10-CM | POA: Diagnosis not present

## 2020-02-21 DIAGNOSIS — E215 Disorder of parathyroid gland, unspecified: Secondary | ICD-10-CM | POA: Diagnosis not present

## 2020-02-21 DIAGNOSIS — S72031D Displaced midcervical fracture of right femur, subsequent encounter for closed fracture with routine healing: Secondary | ICD-10-CM | POA: Diagnosis not present

## 2020-02-21 DIAGNOSIS — Z923 Personal history of irradiation: Secondary | ICD-10-CM | POA: Diagnosis not present

## 2020-02-21 DIAGNOSIS — R34 Anuria and oliguria: Secondary | ICD-10-CM | POA: Diagnosis not present

## 2020-02-21 DIAGNOSIS — E663 Overweight: Secondary | ICD-10-CM | POA: Diagnosis not present

## 2020-02-21 DIAGNOSIS — M7712 Lateral epicondylitis, left elbow: Secondary | ICD-10-CM | POA: Diagnosis not present

## 2020-02-21 DIAGNOSIS — Z510 Encounter for antineoplastic radiation therapy: Secondary | ICD-10-CM | POA: Diagnosis not present

## 2020-02-21 DIAGNOSIS — E272 Addisonian crisis: Secondary | ICD-10-CM | POA: Diagnosis not present

## 2020-02-21 DIAGNOSIS — E1159 Type 2 diabetes mellitus with other circulatory complications: Secondary | ICD-10-CM | POA: Diagnosis not present

## 2020-02-21 DIAGNOSIS — G809 Cerebral palsy, unspecified: Secondary | ICD-10-CM | POA: Diagnosis not present

## 2020-02-21 DIAGNOSIS — M549 Dorsalgia, unspecified: Secondary | ICD-10-CM | POA: Diagnosis not present

## 2020-02-21 DIAGNOSIS — H832X9 Labyrinthine dysfunction, unspecified ear: Secondary | ICD-10-CM | POA: Diagnosis not present

## 2020-02-21 DIAGNOSIS — M8949 Other hypertrophic osteoarthropathy, multiple sites: Secondary | ICD-10-CM | POA: Diagnosis not present

## 2020-02-21 DIAGNOSIS — K582 Mixed irritable bowel syndrome: Secondary | ICD-10-CM | POA: Diagnosis not present

## 2020-02-21 DIAGNOSIS — Z9225 Personal history of immunosupression therapy: Secondary | ICD-10-CM | POA: Diagnosis not present

## 2020-02-21 DIAGNOSIS — H938X2 Other specified disorders of left ear: Secondary | ICD-10-CM | POA: Diagnosis not present

## 2020-02-21 DIAGNOSIS — N179 Acute kidney failure, unspecified: Secondary | ICD-10-CM | POA: Diagnosis not present

## 2020-02-21 DIAGNOSIS — L409 Psoriasis, unspecified: Secondary | ICD-10-CM | POA: Diagnosis not present

## 2020-02-21 DIAGNOSIS — N433 Hydrocele, unspecified: Secondary | ICD-10-CM | POA: Diagnosis not present

## 2020-02-21 DIAGNOSIS — M7062 Trochanteric bursitis, left hip: Secondary | ICD-10-CM | POA: Diagnosis not present

## 2020-02-21 DIAGNOSIS — F064 Anxiety disorder due to known physiological condition: Secondary | ICD-10-CM | POA: Diagnosis not present

## 2020-02-21 DIAGNOSIS — I5020 Unspecified systolic (congestive) heart failure: Secondary | ICD-10-CM | POA: Diagnosis not present

## 2020-02-21 DIAGNOSIS — I779 Disorder of arteries and arterioles, unspecified: Secondary | ICD-10-CM | POA: Diagnosis not present

## 2020-02-21 DIAGNOSIS — M5137 Other intervertebral disc degeneration, lumbosacral region: Secondary | ICD-10-CM | POA: Diagnosis not present

## 2020-02-21 DIAGNOSIS — K52832 Lymphocytic colitis: Secondary | ICD-10-CM | POA: Diagnosis not present

## 2020-02-21 DIAGNOSIS — H40023 Open angle with borderline findings, high risk, bilateral: Secondary | ICD-10-CM | POA: Diagnosis not present

## 2020-02-21 DIAGNOSIS — E0842 Diabetes mellitus due to underlying condition with diabetic polyneuropathy: Secondary | ICD-10-CM | POA: Diagnosis not present

## 2020-02-21 DIAGNOSIS — D631 Anemia in chronic kidney disease: Secondary | ICD-10-CM | POA: Diagnosis not present

## 2020-02-21 DIAGNOSIS — M9903 Segmental and somatic dysfunction of lumbar region: Secondary | ICD-10-CM | POA: Diagnosis not present

## 2020-02-21 DIAGNOSIS — K7460 Unspecified cirrhosis of liver: Secondary | ICD-10-CM | POA: Diagnosis not present

## 2020-02-21 DIAGNOSIS — I38 Endocarditis, valve unspecified: Secondary | ICD-10-CM | POA: Diagnosis not present

## 2020-02-21 DIAGNOSIS — Z6833 Body mass index (BMI) 33.0-33.9, adult: Secondary | ICD-10-CM | POA: Diagnosis not present

## 2020-02-21 DIAGNOSIS — Z0000 Encounter for general adult medical examination without abnormal findings: Secondary | ICD-10-CM | POA: Diagnosis not present

## 2020-02-21 DIAGNOSIS — K449 Diaphragmatic hernia without obstruction or gangrene: Secondary | ICD-10-CM | POA: Diagnosis not present

## 2020-02-21 DIAGNOSIS — F409 Phobic anxiety disorder, unspecified: Secondary | ICD-10-CM | POA: Diagnosis not present

## 2020-02-21 DIAGNOSIS — R6510 Systemic inflammatory response syndrome (SIRS) of non-infectious origin without acute organ dysfunction: Secondary | ICD-10-CM | POA: Diagnosis not present

## 2020-02-21 DIAGNOSIS — L400 Psoriasis vulgaris: Secondary | ICD-10-CM | POA: Diagnosis not present

## 2020-02-21 DIAGNOSIS — M8580 Other specified disorders of bone density and structure, unspecified site: Secondary | ICD-10-CM | POA: Diagnosis not present

## 2020-02-21 DIAGNOSIS — L97312 Non-pressure chronic ulcer of right ankle with fat layer exposed: Secondary | ICD-10-CM | POA: Diagnosis not present

## 2020-02-21 DIAGNOSIS — W19XXXA Unspecified fall, initial encounter: Secondary | ICD-10-CM | POA: Diagnosis not present

## 2020-02-21 DIAGNOSIS — Z7409 Other reduced mobility: Secondary | ICD-10-CM | POA: Diagnosis not present

## 2020-02-21 DIAGNOSIS — K5903 Drug induced constipation: Secondary | ICD-10-CM | POA: Diagnosis not present

## 2020-02-21 DIAGNOSIS — D0439 Carcinoma in situ of skin of other parts of face: Secondary | ICD-10-CM | POA: Diagnosis not present

## 2020-02-21 DIAGNOSIS — R1031 Right lower quadrant pain: Secondary | ICD-10-CM | POA: Diagnosis not present

## 2020-02-21 DIAGNOSIS — R1011 Right upper quadrant pain: Secondary | ICD-10-CM | POA: Diagnosis not present

## 2020-02-21 DIAGNOSIS — R7889 Finding of other specified substances, not normally found in blood: Secondary | ICD-10-CM | POA: Diagnosis not present

## 2020-02-21 DIAGNOSIS — Q600 Renal agenesis, unilateral: Secondary | ICD-10-CM | POA: Diagnosis not present

## 2020-02-21 DIAGNOSIS — Z7951 Long term (current) use of inhaled steroids: Secondary | ICD-10-CM | POA: Diagnosis not present

## 2020-02-21 DIAGNOSIS — M654 Radial styloid tenosynovitis [de Quervain]: Secondary | ICD-10-CM | POA: Diagnosis not present

## 2020-02-21 DIAGNOSIS — D61810 Antineoplastic chemotherapy induced pancytopenia: Secondary | ICD-10-CM | POA: Diagnosis not present

## 2020-02-21 DIAGNOSIS — R739 Hyperglycemia, unspecified: Secondary | ICD-10-CM | POA: Diagnosis not present

## 2020-02-21 DIAGNOSIS — M6282 Rhabdomyolysis: Secondary | ICD-10-CM | POA: Diagnosis not present

## 2020-02-21 DIAGNOSIS — Z13220 Encounter for screening for lipoid disorders: Secondary | ICD-10-CM | POA: Diagnosis not present

## 2020-02-21 DIAGNOSIS — S82841D Displaced bimalleolar fracture of right lower leg, subsequent encounter for closed fracture with routine healing: Secondary | ICD-10-CM | POA: Diagnosis not present

## 2020-02-21 DIAGNOSIS — H905 Unspecified sensorineural hearing loss: Secondary | ICD-10-CM | POA: Diagnosis not present

## 2020-02-21 DIAGNOSIS — G252 Other specified forms of tremor: Secondary | ICD-10-CM | POA: Diagnosis not present

## 2020-02-21 DIAGNOSIS — D242 Benign neoplasm of left breast: Secondary | ICD-10-CM | POA: Diagnosis not present

## 2020-02-21 DIAGNOSIS — J9611 Chronic respiratory failure with hypoxia: Secondary | ICD-10-CM | POA: Diagnosis not present

## 2020-02-21 DIAGNOSIS — M79641 Pain in right hand: Secondary | ICD-10-CM | POA: Diagnosis not present

## 2020-02-21 DIAGNOSIS — F909 Attention-deficit hyperactivity disorder, unspecified type: Secondary | ICD-10-CM | POA: Diagnosis not present

## 2020-02-21 DIAGNOSIS — R45851 Suicidal ideations: Secondary | ICD-10-CM | POA: Diagnosis not present

## 2020-02-21 DIAGNOSIS — F3341 Major depressive disorder, recurrent, in partial remission: Secondary | ICD-10-CM | POA: Diagnosis not present

## 2020-02-21 DIAGNOSIS — I513 Intracardiac thrombosis, not elsewhere classified: Secondary | ICD-10-CM | POA: Diagnosis not present

## 2020-02-21 DIAGNOSIS — H40022 Open angle with borderline findings, high risk, left eye: Secondary | ICD-10-CM | POA: Diagnosis not present

## 2020-02-21 DIAGNOSIS — M4326 Fusion of spine, lumbar region: Secondary | ICD-10-CM | POA: Diagnosis not present

## 2020-02-21 DIAGNOSIS — M059 Rheumatoid arthritis with rheumatoid factor, unspecified: Secondary | ICD-10-CM | POA: Diagnosis not present

## 2020-02-21 DIAGNOSIS — M160 Bilateral primary osteoarthritis of hip: Secondary | ICD-10-CM | POA: Diagnosis not present

## 2020-02-21 DIAGNOSIS — C880 Waldenstrom macroglobulinemia: Secondary | ICD-10-CM | POA: Diagnosis not present

## 2020-02-21 DIAGNOSIS — Z4689 Encounter for fitting and adjustment of other specified devices: Secondary | ICD-10-CM | POA: Diagnosis not present

## 2020-02-21 DIAGNOSIS — R269 Unspecified abnormalities of gait and mobility: Secondary | ICD-10-CM | POA: Diagnosis not present

## 2020-02-21 DIAGNOSIS — R45850 Homicidal ideations: Secondary | ICD-10-CM | POA: Diagnosis not present

## 2020-02-21 DIAGNOSIS — J9612 Chronic respiratory failure with hypercapnia: Secondary | ICD-10-CM | POA: Diagnosis not present

## 2020-02-21 DIAGNOSIS — N182 Chronic kidney disease, stage 2 (mild): Secondary | ICD-10-CM | POA: Diagnosis not present

## 2020-02-21 DIAGNOSIS — D519 Vitamin B12 deficiency anemia, unspecified: Secondary | ICD-10-CM | POA: Diagnosis not present

## 2020-02-21 DIAGNOSIS — Z9012 Acquired absence of left breast and nipple: Secondary | ICD-10-CM | POA: Diagnosis not present

## 2020-02-21 DIAGNOSIS — H5213 Myopia, bilateral: Secondary | ICD-10-CM | POA: Diagnosis not present

## 2020-02-21 DIAGNOSIS — I252 Old myocardial infarction: Secondary | ICD-10-CM | POA: Diagnosis not present

## 2020-02-21 DIAGNOSIS — M25631 Stiffness of right wrist, not elsewhere classified: Secondary | ICD-10-CM | POA: Diagnosis not present

## 2020-02-21 DIAGNOSIS — N390 Urinary tract infection, site not specified: Secondary | ICD-10-CM | POA: Diagnosis not present

## 2020-02-21 DIAGNOSIS — F4481 Dissociative identity disorder: Secondary | ICD-10-CM | POA: Diagnosis not present

## 2020-02-21 DIAGNOSIS — L579 Skin changes due to chronic exposure to nonionizing radiation, unspecified: Secondary | ICD-10-CM | POA: Diagnosis not present

## 2020-02-21 DIAGNOSIS — J849 Interstitial pulmonary disease, unspecified: Secondary | ICD-10-CM | POA: Diagnosis not present

## 2020-02-21 DIAGNOSIS — M7502 Adhesive capsulitis of left shoulder: Secondary | ICD-10-CM | POA: Diagnosis not present

## 2020-02-21 DIAGNOSIS — I471 Supraventricular tachycardia: Secondary | ICD-10-CM | POA: Diagnosis not present

## 2020-02-21 DIAGNOSIS — S134XXA Sprain of ligaments of cervical spine, initial encounter: Secondary | ICD-10-CM | POA: Diagnosis not present

## 2020-02-21 DIAGNOSIS — K469 Unspecified abdominal hernia without obstruction or gangrene: Secondary | ICD-10-CM | POA: Diagnosis not present

## 2020-02-21 DIAGNOSIS — Z6829 Body mass index (BMI) 29.0-29.9, adult: Secondary | ICD-10-CM | POA: Diagnosis not present

## 2020-02-21 DIAGNOSIS — M25561 Pain in right knee: Secondary | ICD-10-CM | POA: Diagnosis not present

## 2020-02-21 DIAGNOSIS — N3281 Overactive bladder: Secondary | ICD-10-CM | POA: Diagnosis not present

## 2020-02-21 DIAGNOSIS — M79662 Pain in left lower leg: Secondary | ICD-10-CM | POA: Diagnosis not present

## 2020-02-21 DIAGNOSIS — H35412 Lattice degeneration of retina, left eye: Secondary | ICD-10-CM | POA: Diagnosis not present

## 2020-02-21 DIAGNOSIS — M25552 Pain in left hip: Secondary | ICD-10-CM | POA: Diagnosis not present

## 2020-02-21 DIAGNOSIS — M3501 Sicca syndrome with keratoconjunctivitis: Secondary | ICD-10-CM | POA: Diagnosis not present

## 2020-02-21 DIAGNOSIS — L97322 Non-pressure chronic ulcer of left ankle with fat layer exposed: Secondary | ICD-10-CM | POA: Diagnosis not present

## 2020-02-21 DIAGNOSIS — E790 Hyperuricemia without signs of inflammatory arthritis and tophaceous disease: Secondary | ICD-10-CM | POA: Diagnosis not present

## 2020-02-21 DIAGNOSIS — Z87891 Personal history of nicotine dependence: Secondary | ICD-10-CM | POA: Diagnosis not present

## 2020-02-21 DIAGNOSIS — M50121 Cervical disc disorder at C4-C5 level with radiculopathy: Secondary | ICD-10-CM | POA: Diagnosis not present

## 2020-02-21 DIAGNOSIS — I890 Lymphedema, not elsewhere classified: Secondary | ICD-10-CM | POA: Diagnosis not present

## 2020-02-21 DIAGNOSIS — M47816 Spondylosis without myelopathy or radiculopathy, lumbar region: Secondary | ICD-10-CM | POA: Diagnosis not present

## 2020-02-21 DIAGNOSIS — D2272 Melanocytic nevi of left lower limb, including hip: Secondary | ICD-10-CM | POA: Diagnosis not present

## 2020-02-21 DIAGNOSIS — Z1231 Encounter for screening mammogram for malignant neoplasm of breast: Secondary | ICD-10-CM | POA: Diagnosis not present

## 2020-02-21 DIAGNOSIS — M1612 Unilateral primary osteoarthritis, left hip: Secondary | ICD-10-CM | POA: Diagnosis not present

## 2020-02-21 DIAGNOSIS — Z823 Family history of stroke: Secondary | ICD-10-CM | POA: Diagnosis not present

## 2020-02-21 DIAGNOSIS — L661 Lichen planopilaris: Secondary | ICD-10-CM | POA: Diagnosis not present

## 2020-02-21 DIAGNOSIS — E669 Obesity, unspecified: Secondary | ICD-10-CM | POA: Diagnosis not present

## 2020-02-21 DIAGNOSIS — Z111 Encounter for screening for respiratory tuberculosis: Secondary | ICD-10-CM | POA: Diagnosis not present

## 2020-02-21 DIAGNOSIS — H3562 Retinal hemorrhage, left eye: Secondary | ICD-10-CM | POA: Diagnosis not present

## 2020-02-21 DIAGNOSIS — J189 Pneumonia, unspecified organism: Secondary | ICD-10-CM | POA: Diagnosis not present

## 2020-02-21 DIAGNOSIS — M1000 Idiopathic gout, unspecified site: Secondary | ICD-10-CM | POA: Diagnosis not present

## 2020-02-21 DIAGNOSIS — Z90710 Acquired absence of both cervix and uterus: Secondary | ICD-10-CM | POA: Diagnosis not present

## 2020-02-21 DIAGNOSIS — R490 Dysphonia: Secondary | ICD-10-CM | POA: Diagnosis not present

## 2020-02-21 DIAGNOSIS — H9313 Tinnitus, bilateral: Secondary | ICD-10-CM | POA: Diagnosis not present

## 2020-02-21 DIAGNOSIS — R351 Nocturia: Secondary | ICD-10-CM | POA: Diagnosis not present

## 2020-02-21 DIAGNOSIS — M1812 Unilateral primary osteoarthritis of first carpometacarpal joint, left hand: Secondary | ICD-10-CM | POA: Diagnosis not present

## 2020-02-21 DIAGNOSIS — R29898 Other symptoms and signs involving the musculoskeletal system: Secondary | ICD-10-CM | POA: Diagnosis not present

## 2020-02-21 DIAGNOSIS — Z01818 Encounter for other preprocedural examination: Secondary | ICD-10-CM | POA: Diagnosis not present

## 2020-02-21 DIAGNOSIS — I87333 Chronic venous hypertension (idiopathic) with ulcer and inflammation of bilateral lower extremity: Secondary | ICD-10-CM | POA: Diagnosis not present

## 2020-02-21 DIAGNOSIS — R278 Other lack of coordination: Secondary | ICD-10-CM | POA: Diagnosis not present

## 2020-02-21 DIAGNOSIS — L97812 Non-pressure chronic ulcer of other part of right lower leg with fat layer exposed: Secondary | ICD-10-CM | POA: Diagnosis not present

## 2020-02-21 DIAGNOSIS — M546 Pain in thoracic spine: Secondary | ICD-10-CM | POA: Diagnosis not present

## 2020-02-21 DIAGNOSIS — E43 Unspecified severe protein-calorie malnutrition: Secondary | ICD-10-CM | POA: Diagnosis not present

## 2020-02-21 DIAGNOSIS — R4189 Other symptoms and signs involving cognitive functions and awareness: Secondary | ICD-10-CM | POA: Diagnosis not present

## 2020-02-21 DIAGNOSIS — H9201 Otalgia, right ear: Secondary | ICD-10-CM | POA: Diagnosis not present

## 2020-02-21 DIAGNOSIS — F988 Other specified behavioral and emotional disorders with onset usually occurring in childhood and adolescence: Secondary | ICD-10-CM | POA: Diagnosis not present

## 2020-02-21 DIAGNOSIS — Z780 Asymptomatic menopausal state: Secondary | ICD-10-CM | POA: Diagnosis not present

## 2020-02-21 DIAGNOSIS — Z6830 Body mass index (BMI) 30.0-30.9, adult: Secondary | ICD-10-CM | POA: Diagnosis not present

## 2020-02-21 DIAGNOSIS — I69393 Ataxia following cerebral infarction: Secondary | ICD-10-CM | POA: Diagnosis not present

## 2020-02-21 DIAGNOSIS — N1832 Chronic kidney disease, stage 3b: Secondary | ICD-10-CM | POA: Diagnosis not present

## 2020-02-21 DIAGNOSIS — S72011D Unspecified intracapsular fracture of right femur, subsequent encounter for closed fracture with routine healing: Secondary | ICD-10-CM | POA: Diagnosis not present

## 2020-02-21 DIAGNOSIS — H40051 Ocular hypertension, right eye: Secondary | ICD-10-CM | POA: Diagnosis not present

## 2020-02-21 DIAGNOSIS — N130 Hydronephrosis with ureteropelvic junction obstruction: Secondary | ICD-10-CM | POA: Diagnosis not present

## 2020-02-21 DIAGNOSIS — H02831 Dermatochalasis of right upper eyelid: Secondary | ICD-10-CM | POA: Diagnosis not present

## 2020-02-21 DIAGNOSIS — F5105 Insomnia due to other mental disorder: Secondary | ICD-10-CM | POA: Diagnosis not present

## 2020-02-21 DIAGNOSIS — S40861A Insect bite (nonvenomous) of right upper arm, initial encounter: Secondary | ICD-10-CM | POA: Diagnosis not present

## 2020-02-21 DIAGNOSIS — D6832 Hemorrhagic disorder due to extrinsic circulating anticoagulants: Secondary | ICD-10-CM | POA: Diagnosis not present

## 2020-02-21 DIAGNOSIS — Z96611 Presence of right artificial shoulder joint: Secondary | ICD-10-CM | POA: Diagnosis not present

## 2020-02-21 DIAGNOSIS — H18529 Epithelial (juvenile) corneal dystrophy, unspecified eye: Secondary | ICD-10-CM | POA: Diagnosis not present

## 2020-02-21 DIAGNOSIS — H401131 Primary open-angle glaucoma, bilateral, mild stage: Secondary | ICD-10-CM | POA: Diagnosis not present

## 2020-02-21 DIAGNOSIS — H35033 Hypertensive retinopathy, bilateral: Secondary | ICD-10-CM | POA: Diagnosis not present

## 2020-02-21 DIAGNOSIS — N184 Chronic kidney disease, stage 4 (severe): Secondary | ICD-10-CM | POA: Diagnosis not present

## 2020-02-21 DIAGNOSIS — C4441 Basal cell carcinoma of skin of scalp and neck: Secondary | ICD-10-CM | POA: Diagnosis not present

## 2020-02-21 DIAGNOSIS — N320 Bladder-neck obstruction: Secondary | ICD-10-CM | POA: Diagnosis not present

## 2020-02-21 DIAGNOSIS — K3184 Gastroparesis: Secondary | ICD-10-CM | POA: Diagnosis not present

## 2020-02-21 DIAGNOSIS — D6959 Other secondary thrombocytopenia: Secondary | ICD-10-CM | POA: Diagnosis not present

## 2020-02-21 DIAGNOSIS — Z882 Allergy status to sulfonamides status: Secondary | ICD-10-CM | POA: Diagnosis not present

## 2020-02-21 DIAGNOSIS — M19032 Primary osteoarthritis, left wrist: Secondary | ICD-10-CM | POA: Diagnosis not present

## 2020-02-21 DIAGNOSIS — I350 Nonrheumatic aortic (valve) stenosis: Secondary | ICD-10-CM | POA: Diagnosis not present

## 2020-02-21 DIAGNOSIS — R3915 Urgency of urination: Secondary | ICD-10-CM | POA: Diagnosis not present

## 2020-02-21 DIAGNOSIS — S3210XK Unspecified fracture of sacrum, subsequent encounter for fracture with nonunion: Secondary | ICD-10-CM | POA: Diagnosis not present

## 2020-02-21 DIAGNOSIS — D509 Iron deficiency anemia, unspecified: Secondary | ICD-10-CM | POA: Diagnosis not present

## 2020-02-21 DIAGNOSIS — J3489 Other specified disorders of nose and nasal sinuses: Secondary | ICD-10-CM | POA: Diagnosis not present

## 2020-02-21 DIAGNOSIS — N138 Other obstructive and reflux uropathy: Secondary | ICD-10-CM | POA: Diagnosis not present

## 2020-02-21 DIAGNOSIS — F1120 Opioid dependence, uncomplicated: Secondary | ICD-10-CM | POA: Diagnosis not present

## 2020-02-21 DIAGNOSIS — Z48298 Encounter for aftercare following other organ transplant: Secondary | ICD-10-CM | POA: Diagnosis not present

## 2020-02-21 DIAGNOSIS — Z96652 Presence of left artificial knee joint: Secondary | ICD-10-CM | POA: Diagnosis not present

## 2020-02-21 DIAGNOSIS — L814 Other melanin hyperpigmentation: Secondary | ICD-10-CM | POA: Diagnosis not present

## 2020-02-21 DIAGNOSIS — S81802D Unspecified open wound, left lower leg, subsequent encounter: Secondary | ICD-10-CM | POA: Diagnosis not present

## 2020-02-21 DIAGNOSIS — N3289 Other specified disorders of bladder: Secondary | ICD-10-CM | POA: Diagnosis not present

## 2020-02-21 DIAGNOSIS — G720 Drug-induced myopathy: Secondary | ICD-10-CM | POA: Diagnosis not present

## 2020-02-21 DIAGNOSIS — F17211 Nicotine dependence, cigarettes, in remission: Secondary | ICD-10-CM | POA: Diagnosis not present

## 2020-02-21 DIAGNOSIS — E785 Hyperlipidemia, unspecified: Secondary | ICD-10-CM | POA: Diagnosis not present

## 2020-02-21 DIAGNOSIS — G20 Parkinson's disease: Secondary | ICD-10-CM | POA: Diagnosis not present

## 2020-02-21 DIAGNOSIS — T7840XD Allergy, unspecified, subsequent encounter: Secondary | ICD-10-CM | POA: Diagnosis not present

## 2020-02-21 DIAGNOSIS — J301 Allergic rhinitis due to pollen: Secondary | ICD-10-CM | POA: Diagnosis not present

## 2020-02-21 DIAGNOSIS — M6740 Ganglion, unspecified site: Secondary | ICD-10-CM | POA: Diagnosis not present

## 2020-02-21 DIAGNOSIS — H538 Other visual disturbances: Secondary | ICD-10-CM | POA: Diagnosis not present

## 2020-02-21 DIAGNOSIS — W19XXXS Unspecified fall, sequela: Secondary | ICD-10-CM | POA: Diagnosis not present

## 2020-02-21 DIAGNOSIS — M79672 Pain in left foot: Secondary | ICD-10-CM | POA: Diagnosis not present

## 2020-02-21 DIAGNOSIS — I8511 Secondary esophageal varices with bleeding: Secondary | ICD-10-CM | POA: Diagnosis not present

## 2020-02-21 DIAGNOSIS — L2084 Intrinsic (allergic) eczema: Secondary | ICD-10-CM | POA: Diagnosis not present

## 2020-02-21 DIAGNOSIS — C44629 Squamous cell carcinoma of skin of left upper limb, including shoulder: Secondary | ICD-10-CM | POA: Diagnosis not present

## 2020-02-21 DIAGNOSIS — M25612 Stiffness of left shoulder, not elsewhere classified: Secondary | ICD-10-CM | POA: Diagnosis not present

## 2020-02-21 DIAGNOSIS — W57XXXA Bitten or stung by nonvenomous insect and other nonvenomous arthropods, initial encounter: Secondary | ICD-10-CM | POA: Diagnosis not present

## 2020-02-21 DIAGNOSIS — R799 Abnormal finding of blood chemistry, unspecified: Secondary | ICD-10-CM | POA: Diagnosis not present

## 2020-02-21 DIAGNOSIS — I5042 Chronic combined systolic (congestive) and diastolic (congestive) heart failure: Secondary | ICD-10-CM | POA: Diagnosis not present

## 2020-02-21 DIAGNOSIS — M4726 Other spondylosis with radiculopathy, lumbar region: Secondary | ICD-10-CM | POA: Diagnosis not present

## 2020-02-21 DIAGNOSIS — C210 Malignant neoplasm of anus, unspecified: Secondary | ICD-10-CM | POA: Diagnosis not present

## 2020-02-21 DIAGNOSIS — I7090 Unspecified atherosclerosis: Secondary | ICD-10-CM | POA: Diagnosis not present

## 2020-02-21 DIAGNOSIS — S80811A Abrasion, right lower leg, initial encounter: Secondary | ICD-10-CM | POA: Diagnosis not present

## 2020-02-21 DIAGNOSIS — J9610 Chronic respiratory failure, unspecified whether with hypoxia or hypercapnia: Secondary | ICD-10-CM | POA: Diagnosis not present

## 2020-02-21 DIAGNOSIS — H34831 Tributary (branch) retinal vein occlusion, right eye, with macular edema: Secondary | ICD-10-CM | POA: Diagnosis not present

## 2020-02-21 DIAGNOSIS — M25611 Stiffness of right shoulder, not elsewhere classified: Secondary | ICD-10-CM | POA: Diagnosis not present

## 2020-02-21 DIAGNOSIS — M2578 Osteophyte, vertebrae: Secondary | ICD-10-CM | POA: Diagnosis not present

## 2020-02-21 DIAGNOSIS — G9050 Complex regional pain syndrome I, unspecified: Secondary | ICD-10-CM | POA: Diagnosis not present

## 2020-02-21 DIAGNOSIS — Z8616 Personal history of COVID-19: Secondary | ICD-10-CM | POA: Diagnosis not present

## 2020-02-21 DIAGNOSIS — T8131XA Disruption of external operation (surgical) wound, not elsewhere classified, initial encounter: Secondary | ICD-10-CM | POA: Diagnosis not present

## 2020-02-21 DIAGNOSIS — D849 Immunodeficiency, unspecified: Secondary | ICD-10-CM | POA: Diagnosis not present

## 2020-02-21 DIAGNOSIS — I160 Hypertensive urgency: Secondary | ICD-10-CM | POA: Diagnosis not present

## 2020-02-21 DIAGNOSIS — M1A00X0 Idiopathic chronic gout, unspecified site, without tophus (tophi): Secondary | ICD-10-CM | POA: Diagnosis not present

## 2020-02-21 DIAGNOSIS — R1013 Epigastric pain: Secondary | ICD-10-CM | POA: Diagnosis not present

## 2020-02-21 DIAGNOSIS — N393 Stress incontinence (female) (male): Secondary | ICD-10-CM | POA: Diagnosis not present

## 2020-02-21 DIAGNOSIS — M80052D Age-related osteoporosis with current pathological fracture, left femur, subsequent encounter for fracture with routine healing: Secondary | ICD-10-CM | POA: Diagnosis not present

## 2020-02-21 DIAGNOSIS — L439 Lichen planus, unspecified: Secondary | ICD-10-CM | POA: Diagnosis not present

## 2020-02-21 DIAGNOSIS — H2512 Age-related nuclear cataract, left eye: Secondary | ICD-10-CM | POA: Diagnosis not present

## 2020-02-21 DIAGNOSIS — R0989 Other specified symptoms and signs involving the circulatory and respiratory systems: Secondary | ICD-10-CM | POA: Diagnosis not present

## 2020-02-21 DIAGNOSIS — I2699 Other pulmonary embolism without acute cor pulmonale: Secondary | ICD-10-CM | POA: Diagnosis not present

## 2020-02-21 DIAGNOSIS — R5081 Fever presenting with conditions classified elsewhere: Secondary | ICD-10-CM | POA: Diagnosis not present

## 2020-02-21 DIAGNOSIS — M19042 Primary osteoarthritis, left hand: Secondary | ICD-10-CM | POA: Diagnosis not present

## 2020-02-21 DIAGNOSIS — S0101XD Laceration without foreign body of scalp, subsequent encounter: Secondary | ICD-10-CM | POA: Diagnosis not present

## 2020-02-21 DIAGNOSIS — M62561 Muscle wasting and atrophy, not elsewhere classified, right lower leg: Secondary | ICD-10-CM | POA: Diagnosis not present

## 2020-02-21 DIAGNOSIS — M85859 Other specified disorders of bone density and structure, unspecified thigh: Secondary | ICD-10-CM | POA: Diagnosis not present

## 2020-02-21 DIAGNOSIS — K219 Gastro-esophageal reflux disease without esophagitis: Secondary | ICD-10-CM | POA: Diagnosis not present

## 2020-02-21 DIAGNOSIS — H8112 Benign paroxysmal vertigo, left ear: Secondary | ICD-10-CM | POA: Diagnosis not present

## 2020-02-21 DIAGNOSIS — C539 Malignant neoplasm of cervix uteri, unspecified: Secondary | ICD-10-CM | POA: Diagnosis not present

## 2020-02-21 DIAGNOSIS — H18513 Endothelial corneal dystrophy, bilateral: Secondary | ICD-10-CM | POA: Diagnosis not present

## 2020-02-21 DIAGNOSIS — I69354 Hemiplegia and hemiparesis following cerebral infarction affecting left non-dominant side: Secondary | ICD-10-CM | POA: Diagnosis not present

## 2020-02-21 DIAGNOSIS — Z13820 Encounter for screening for osteoporosis: Secondary | ICD-10-CM | POA: Diagnosis not present

## 2020-02-21 DIAGNOSIS — T502X5A Adverse effect of carbonic-anhydrase inhibitors, benzothiadiazides and other diuretics, initial encounter: Secondary | ICD-10-CM | POA: Diagnosis not present

## 2020-02-21 DIAGNOSIS — M4126 Other idiopathic scoliosis, lumbar region: Secondary | ICD-10-CM | POA: Diagnosis not present

## 2020-02-21 DIAGNOSIS — D126 Benign neoplasm of colon, unspecified: Secondary | ICD-10-CM | POA: Diagnosis not present

## 2020-02-21 DIAGNOSIS — M172 Bilateral post-traumatic osteoarthritis of knee: Secondary | ICD-10-CM | POA: Diagnosis not present

## 2020-02-21 DIAGNOSIS — S82891D Other fracture of right lower leg, subsequent encounter for closed fracture with routine healing: Secondary | ICD-10-CM | POA: Diagnosis not present

## 2020-02-21 DIAGNOSIS — F332 Major depressive disorder, recurrent severe without psychotic features: Secondary | ICD-10-CM | POA: Diagnosis not present

## 2020-02-21 DIAGNOSIS — R180 Malignant ascites: Secondary | ICD-10-CM | POA: Diagnosis not present

## 2020-02-21 DIAGNOSIS — I483 Typical atrial flutter: Secondary | ICD-10-CM | POA: Diagnosis not present

## 2020-02-21 DIAGNOSIS — S8256XS Nondisplaced fracture of medial malleolus of unspecified tibia, sequela: Secondary | ICD-10-CM | POA: Diagnosis not present

## 2020-02-21 DIAGNOSIS — N816 Rectocele: Secondary | ICD-10-CM | POA: Diagnosis not present

## 2020-02-21 DIAGNOSIS — M792 Neuralgia and neuritis, unspecified: Secondary | ICD-10-CM | POA: Diagnosis not present

## 2020-02-21 DIAGNOSIS — D2262 Melanocytic nevi of left upper limb, including shoulder: Secondary | ICD-10-CM | POA: Diagnosis not present

## 2020-02-21 DIAGNOSIS — M79674 Pain in right toe(s): Secondary | ICD-10-CM | POA: Diagnosis not present

## 2020-02-21 DIAGNOSIS — M778 Other enthesopathies, not elsewhere classified: Secondary | ICD-10-CM | POA: Diagnosis not present

## 2020-02-21 DIAGNOSIS — M25751 Osteophyte, right hip: Secondary | ICD-10-CM | POA: Diagnosis not present

## 2020-02-21 DIAGNOSIS — F0391 Unspecified dementia with behavioral disturbance: Secondary | ICD-10-CM | POA: Diagnosis not present

## 2020-02-21 DIAGNOSIS — D649 Anemia, unspecified: Secondary | ICD-10-CM | POA: Diagnosis not present

## 2020-02-21 DIAGNOSIS — C50919 Malignant neoplasm of unspecified site of unspecified female breast: Secondary | ICD-10-CM | POA: Diagnosis not present

## 2020-02-21 DIAGNOSIS — G43119 Migraine with aura, intractable, without status migrainosus: Secondary | ICD-10-CM | POA: Diagnosis not present

## 2020-02-21 DIAGNOSIS — N6011 Diffuse cystic mastopathy of right breast: Secondary | ICD-10-CM | POA: Diagnosis not present

## 2020-02-21 DIAGNOSIS — I1310 Hypertensive heart and chronic kidney disease without heart failure, with stage 1 through stage 4 chronic kidney disease, or unspecified chronic kidney disease: Secondary | ICD-10-CM | POA: Diagnosis not present

## 2020-02-21 DIAGNOSIS — S52501A Unspecified fracture of the lower end of right radius, initial encounter for closed fracture: Secondary | ICD-10-CM | POA: Diagnosis not present

## 2020-02-21 DIAGNOSIS — M4722 Other spondylosis with radiculopathy, cervical region: Secondary | ICD-10-CM | POA: Diagnosis not present

## 2020-02-21 DIAGNOSIS — R946 Abnormal results of thyroid function studies: Secondary | ICD-10-CM | POA: Diagnosis not present

## 2020-02-21 DIAGNOSIS — E1151 Type 2 diabetes mellitus with diabetic peripheral angiopathy without gangrene: Secondary | ICD-10-CM | POA: Diagnosis not present

## 2020-02-21 DIAGNOSIS — I351 Nonrheumatic aortic (valve) insufficiency: Secondary | ICD-10-CM | POA: Diagnosis not present

## 2020-02-21 DIAGNOSIS — G588 Other specified mononeuropathies: Secondary | ICD-10-CM | POA: Diagnosis not present

## 2020-02-21 DIAGNOSIS — G308 Other Alzheimer's disease: Secondary | ICD-10-CM | POA: Diagnosis not present

## 2020-02-21 DIAGNOSIS — D1801 Hemangioma of skin and subcutaneous tissue: Secondary | ICD-10-CM | POA: Diagnosis not present

## 2020-02-21 DIAGNOSIS — Z125 Encounter for screening for malignant neoplasm of prostate: Secondary | ICD-10-CM | POA: Diagnosis not present

## 2020-02-21 DIAGNOSIS — M47892 Other spondylosis, cervical region: Secondary | ICD-10-CM | POA: Diagnosis not present

## 2020-02-21 DIAGNOSIS — G4700 Insomnia, unspecified: Secondary | ICD-10-CM | POA: Diagnosis not present

## 2020-02-21 DIAGNOSIS — M12812 Other specific arthropathies, not elsewhere classified, left shoulder: Secondary | ICD-10-CM | POA: Diagnosis not present

## 2020-02-21 DIAGNOSIS — M4316 Spondylolisthesis, lumbar region: Secondary | ICD-10-CM | POA: Diagnosis not present

## 2020-02-21 DIAGNOSIS — E113512 Type 2 diabetes mellitus with proliferative diabetic retinopathy with macular edema, left eye: Secondary | ICD-10-CM | POA: Diagnosis not present

## 2020-02-21 DIAGNOSIS — Z1339 Encounter for screening examination for other mental health and behavioral disorders: Secondary | ICD-10-CM | POA: Diagnosis not present

## 2020-02-21 DIAGNOSIS — K118 Other diseases of salivary glands: Secondary | ICD-10-CM | POA: Diagnosis not present

## 2020-02-21 DIAGNOSIS — N5201 Erectile dysfunction due to arterial insufficiency: Secondary | ICD-10-CM | POA: Diagnosis not present

## 2020-02-21 DIAGNOSIS — C661 Malignant neoplasm of right ureter: Secondary | ICD-10-CM | POA: Diagnosis not present

## 2020-02-21 DIAGNOSIS — M25531 Pain in right wrist: Secondary | ICD-10-CM | POA: Diagnosis not present

## 2020-02-21 DIAGNOSIS — Z791 Long term (current) use of non-steroidal anti-inflammatories (NSAID): Secondary | ICD-10-CM | POA: Diagnosis not present

## 2020-02-21 DIAGNOSIS — M159 Polyosteoarthritis, unspecified: Secondary | ICD-10-CM | POA: Diagnosis not present

## 2020-02-21 DIAGNOSIS — I63412 Cerebral infarction due to embolism of left middle cerebral artery: Secondary | ICD-10-CM | POA: Diagnosis not present

## 2020-02-21 DIAGNOSIS — Z8249 Family history of ischemic heart disease and other diseases of the circulatory system: Secondary | ICD-10-CM | POA: Diagnosis not present

## 2020-02-21 DIAGNOSIS — E1121 Type 2 diabetes mellitus with diabetic nephropathy: Secondary | ICD-10-CM | POA: Diagnosis not present

## 2020-02-21 DIAGNOSIS — L03119 Cellulitis of unspecified part of limb: Secondary | ICD-10-CM | POA: Diagnosis not present

## 2020-02-21 DIAGNOSIS — R748 Abnormal levels of other serum enzymes: Secondary | ICD-10-CM | POA: Diagnosis not present

## 2020-02-21 DIAGNOSIS — M25642 Stiffness of left hand, not elsewhere classified: Secondary | ICD-10-CM | POA: Diagnosis not present

## 2020-02-21 DIAGNOSIS — G40909 Epilepsy, unspecified, not intractable, without status epilepticus: Secondary | ICD-10-CM | POA: Diagnosis not present

## 2020-02-21 DIAGNOSIS — H43822 Vitreomacular adhesion, left eye: Secondary | ICD-10-CM | POA: Diagnosis not present

## 2020-02-21 DIAGNOSIS — M25511 Pain in right shoulder: Secondary | ICD-10-CM | POA: Diagnosis not present

## 2020-02-21 DIAGNOSIS — R140 Abdominal distension (gaseous): Secondary | ICD-10-CM | POA: Diagnosis not present

## 2020-02-21 DIAGNOSIS — Z992 Dependence on renal dialysis: Secondary | ICD-10-CM | POA: Diagnosis not present

## 2020-02-21 DIAGNOSIS — Z872 Personal history of diseases of the skin and subcutaneous tissue: Secondary | ICD-10-CM | POA: Diagnosis not present

## 2020-02-21 DIAGNOSIS — J060 Acute laryngopharyngitis: Secondary | ICD-10-CM | POA: Diagnosis not present

## 2020-02-21 DIAGNOSIS — R143 Flatulence: Secondary | ICD-10-CM | POA: Diagnosis not present

## 2020-02-21 DIAGNOSIS — K642 Third degree hemorrhoids: Secondary | ICD-10-CM | POA: Diagnosis not present

## 2020-02-21 DIAGNOSIS — Z471 Aftercare following joint replacement surgery: Secondary | ICD-10-CM | POA: Diagnosis not present

## 2020-02-21 DIAGNOSIS — M1388 Other specified arthritis, other site: Secondary | ICD-10-CM | POA: Diagnosis not present

## 2020-02-21 DIAGNOSIS — M7060 Trochanteric bursitis, unspecified hip: Secondary | ICD-10-CM | POA: Diagnosis not present

## 2020-02-21 DIAGNOSIS — M25572 Pain in left ankle and joints of left foot: Secondary | ICD-10-CM | POA: Diagnosis not present

## 2020-02-21 DIAGNOSIS — Z95810 Presence of automatic (implantable) cardiac defibrillator: Secondary | ICD-10-CM | POA: Diagnosis not present

## 2020-02-21 DIAGNOSIS — L859 Epidermal thickening, unspecified: Secondary | ICD-10-CM | POA: Diagnosis not present

## 2020-02-21 DIAGNOSIS — Z1329 Encounter for screening for other suspected endocrine disorder: Secondary | ICD-10-CM | POA: Diagnosis not present

## 2020-02-21 DIAGNOSIS — R1012 Left upper quadrant pain: Secondary | ICD-10-CM | POA: Diagnosis not present

## 2020-02-21 DIAGNOSIS — M25571 Pain in right ankle and joints of right foot: Secondary | ICD-10-CM | POA: Diagnosis not present

## 2020-02-21 DIAGNOSIS — Z8261 Family history of arthritis: Secondary | ICD-10-CM | POA: Diagnosis not present

## 2020-02-21 DIAGNOSIS — H35371 Puckering of macula, right eye: Secondary | ICD-10-CM | POA: Diagnosis not present

## 2020-02-21 DIAGNOSIS — S32592A Other specified fracture of left pubis, initial encounter for closed fracture: Secondary | ICD-10-CM | POA: Diagnosis not present

## 2020-02-21 DIAGNOSIS — H00021 Hordeolum internum right upper eyelid: Secondary | ICD-10-CM | POA: Diagnosis not present

## 2020-02-21 DIAGNOSIS — I714 Abdominal aortic aneurysm, without rupture: Secondary | ICD-10-CM | POA: Diagnosis not present

## 2020-02-21 DIAGNOSIS — D61818 Other pancytopenia: Secondary | ICD-10-CM | POA: Diagnosis not present

## 2020-02-21 DIAGNOSIS — F419 Anxiety disorder, unspecified: Secondary | ICD-10-CM | POA: Diagnosis not present

## 2020-02-21 DIAGNOSIS — R3914 Feeling of incomplete bladder emptying: Secondary | ICD-10-CM | POA: Diagnosis not present

## 2020-02-21 DIAGNOSIS — S32030S Wedge compression fracture of third lumbar vertebra, sequela: Secondary | ICD-10-CM | POA: Diagnosis not present

## 2020-02-21 DIAGNOSIS — M9900 Segmental and somatic dysfunction of head region: Secondary | ICD-10-CM | POA: Diagnosis not present

## 2020-02-21 DIAGNOSIS — M797 Fibromyalgia: Secondary | ICD-10-CM | POA: Diagnosis not present

## 2020-02-21 DIAGNOSIS — Z6841 Body Mass Index (BMI) 40.0 and over, adult: Secondary | ICD-10-CM | POA: Diagnosis not present

## 2020-02-21 DIAGNOSIS — B3742 Candidal balanitis: Secondary | ICD-10-CM | POA: Diagnosis not present

## 2020-02-21 DIAGNOSIS — N959 Unspecified menopausal and perimenopausal disorder: Secondary | ICD-10-CM | POA: Diagnosis not present

## 2020-02-21 DIAGNOSIS — R040 Epistaxis: Secondary | ICD-10-CM | POA: Diagnosis not present

## 2020-02-21 DIAGNOSIS — C8400 Mycosis fungoides, unspecified site: Secondary | ICD-10-CM | POA: Diagnosis not present

## 2020-02-21 DIAGNOSIS — C50411 Malignant neoplasm of upper-outer quadrant of right female breast: Secondary | ICD-10-CM | POA: Diagnosis not present

## 2020-02-21 DIAGNOSIS — H353231 Exudative age-related macular degeneration, bilateral, with active choroidal neovascularization: Secondary | ICD-10-CM | POA: Diagnosis not present

## 2020-02-21 DIAGNOSIS — R972 Elevated prostate specific antigen [PSA]: Secondary | ICD-10-CM | POA: Diagnosis not present

## 2020-02-21 DIAGNOSIS — K760 Fatty (change of) liver, not elsewhere classified: Secondary | ICD-10-CM | POA: Diagnosis not present

## 2020-02-21 DIAGNOSIS — M5146 Schmorl's nodes, lumbar region: Secondary | ICD-10-CM | POA: Diagnosis not present

## 2020-02-21 DIAGNOSIS — M5136 Other intervertebral disc degeneration, lumbar region: Secondary | ICD-10-CM | POA: Diagnosis not present

## 2020-02-21 DIAGNOSIS — Q675 Congenital deformity of spine: Secondary | ICD-10-CM | POA: Diagnosis not present

## 2020-02-21 DIAGNOSIS — K900 Celiac disease: Secondary | ICD-10-CM | POA: Diagnosis not present

## 2020-02-21 DIAGNOSIS — R51 Headache with orthostatic component, not elsewhere classified: Secondary | ICD-10-CM | POA: Diagnosis not present

## 2020-02-21 DIAGNOSIS — C3491 Malignant neoplasm of unspecified part of right bronchus or lung: Secondary | ICD-10-CM | POA: Diagnosis not present

## 2020-02-21 DIAGNOSIS — L84 Corns and callosities: Secondary | ICD-10-CM | POA: Diagnosis not present

## 2020-02-21 DIAGNOSIS — Z6837 Body mass index (BMI) 37.0-37.9, adult: Secondary | ICD-10-CM | POA: Diagnosis not present

## 2020-02-21 DIAGNOSIS — N8110 Cystocele, unspecified: Secondary | ICD-10-CM | POA: Diagnosis not present

## 2020-02-21 DIAGNOSIS — W010XXD Fall on same level from slipping, tripping and stumbling without subsequent striking against object, subsequent encounter: Secondary | ICD-10-CM | POA: Diagnosis not present

## 2020-02-21 DIAGNOSIS — M62838 Other muscle spasm: Secondary | ICD-10-CM | POA: Diagnosis not present

## 2020-02-21 DIAGNOSIS — M5126 Other intervertebral disc displacement, lumbar region: Secondary | ICD-10-CM | POA: Diagnosis not present

## 2020-02-21 DIAGNOSIS — I428 Other cardiomyopathies: Secondary | ICD-10-CM | POA: Diagnosis not present

## 2020-02-21 DIAGNOSIS — M069 Rheumatoid arthritis, unspecified: Secondary | ICD-10-CM | POA: Diagnosis not present

## 2020-02-21 DIAGNOSIS — S3210XA Unspecified fracture of sacrum, initial encounter for closed fracture: Secondary | ICD-10-CM | POA: Diagnosis not present

## 2020-02-21 DIAGNOSIS — Z4889 Encounter for other specified surgical aftercare: Secondary | ICD-10-CM | POA: Diagnosis not present

## 2020-02-21 DIAGNOSIS — I498 Other specified cardiac arrhythmias: Secondary | ICD-10-CM | POA: Diagnosis not present

## 2020-02-21 DIAGNOSIS — M2022 Hallux rigidus, left foot: Secondary | ICD-10-CM | POA: Diagnosis not present

## 2020-02-21 DIAGNOSIS — E042 Nontoxic multinodular goiter: Secondary | ICD-10-CM | POA: Diagnosis not present

## 2020-02-21 DIAGNOSIS — S336XXA Sprain of sacroiliac joint, initial encounter: Secondary | ICD-10-CM | POA: Diagnosis not present

## 2020-02-21 DIAGNOSIS — L609 Nail disorder, unspecified: Secondary | ICD-10-CM | POA: Diagnosis not present

## 2020-02-21 DIAGNOSIS — F314 Bipolar disorder, current episode depressed, severe, without psychotic features: Secondary | ICD-10-CM | POA: Diagnosis not present

## 2020-02-21 DIAGNOSIS — Z981 Arthrodesis status: Secondary | ICD-10-CM | POA: Diagnosis not present

## 2020-02-21 DIAGNOSIS — M5106 Intervertebral disc disorders with myelopathy, lumbar region: Secondary | ICD-10-CM | POA: Diagnosis not present

## 2020-02-21 DIAGNOSIS — H18593 Other hereditary corneal dystrophies, bilateral: Secondary | ICD-10-CM | POA: Diagnosis not present

## 2020-02-21 DIAGNOSIS — H01023 Squamous blepharitis right eye, unspecified eyelid: Secondary | ICD-10-CM | POA: Diagnosis not present

## 2020-02-21 DIAGNOSIS — K50813 Crohn's disease of both small and large intestine with fistula: Secondary | ICD-10-CM | POA: Diagnosis not present

## 2020-02-21 DIAGNOSIS — I951 Orthostatic hypotension: Secondary | ICD-10-CM | POA: Diagnosis not present

## 2020-02-21 DIAGNOSIS — R062 Wheezing: Secondary | ICD-10-CM | POA: Diagnosis not present

## 2020-02-21 DIAGNOSIS — H353131 Nonexudative age-related macular degeneration, bilateral, early dry stage: Secondary | ICD-10-CM | POA: Diagnosis not present

## 2020-02-21 DIAGNOSIS — Z85820 Personal history of malignant melanoma of skin: Secondary | ICD-10-CM | POA: Diagnosis not present

## 2020-02-21 DIAGNOSIS — J4530 Mild persistent asthma, uncomplicated: Secondary | ICD-10-CM | POA: Diagnosis not present

## 2020-02-21 DIAGNOSIS — F330 Major depressive disorder, recurrent, mild: Secondary | ICD-10-CM | POA: Diagnosis not present

## 2020-02-21 DIAGNOSIS — Z6825 Body mass index (BMI) 25.0-25.9, adult: Secondary | ICD-10-CM | POA: Diagnosis not present

## 2020-02-21 DIAGNOSIS — G6189 Other inflammatory polyneuropathies: Secondary | ICD-10-CM | POA: Diagnosis not present

## 2020-02-21 DIAGNOSIS — H6122 Impacted cerumen, left ear: Secondary | ICD-10-CM | POA: Diagnosis not present

## 2020-02-21 DIAGNOSIS — D692 Other nonthrombocytopenic purpura: Secondary | ICD-10-CM | POA: Diagnosis not present

## 2020-02-21 DIAGNOSIS — Z801 Family history of malignant neoplasm of trachea, bronchus and lung: Secondary | ICD-10-CM | POA: Diagnosis not present

## 2020-02-21 DIAGNOSIS — F0150 Vascular dementia without behavioral disturbance: Secondary | ICD-10-CM | POA: Diagnosis not present

## 2020-02-21 DIAGNOSIS — R2681 Unsteadiness on feet: Secondary | ICD-10-CM | POA: Diagnosis not present

## 2020-02-21 DIAGNOSIS — M179 Osteoarthritis of knee, unspecified: Secondary | ICD-10-CM | POA: Diagnosis not present

## 2020-02-21 DIAGNOSIS — B182 Chronic viral hepatitis C: Secondary | ICD-10-CM | POA: Diagnosis not present

## 2020-02-21 DIAGNOSIS — S76312D Strain of muscle, fascia and tendon of the posterior muscle group at thigh level, left thigh, subsequent encounter: Secondary | ICD-10-CM | POA: Diagnosis not present

## 2020-02-21 DIAGNOSIS — G8252 Quadriplegia, C1-C4 incomplete: Secondary | ICD-10-CM | POA: Diagnosis not present

## 2020-02-21 DIAGNOSIS — M5440 Lumbago with sciatica, unspecified side: Secondary | ICD-10-CM | POA: Diagnosis not present

## 2020-02-21 DIAGNOSIS — R809 Proteinuria, unspecified: Secondary | ICD-10-CM | POA: Diagnosis not present

## 2020-02-21 DIAGNOSIS — H6982 Other specified disorders of Eustachian tube, left ear: Secondary | ICD-10-CM | POA: Diagnosis not present

## 2020-02-21 DIAGNOSIS — S90862A Insect bite (nonvenomous), left foot, initial encounter: Secondary | ICD-10-CM | POA: Diagnosis not present

## 2020-02-21 DIAGNOSIS — K8020 Calculus of gallbladder without cholecystitis without obstruction: Secondary | ICD-10-CM | POA: Diagnosis not present

## 2020-02-21 DIAGNOSIS — Z7901 Long term (current) use of anticoagulants: Secondary | ICD-10-CM | POA: Diagnosis not present

## 2020-02-21 DIAGNOSIS — C50812 Malignant neoplasm of overlapping sites of left female breast: Secondary | ICD-10-CM | POA: Diagnosis not present

## 2020-02-21 DIAGNOSIS — B961 Klebsiella pneumoniae [K. pneumoniae] as the cause of diseases classified elsewhere: Secondary | ICD-10-CM | POA: Diagnosis not present

## 2020-02-21 DIAGNOSIS — E781 Pure hyperglyceridemia: Secondary | ICD-10-CM | POA: Diagnosis not present

## 2020-02-21 DIAGNOSIS — R4589 Other symptoms and signs involving emotional state: Secondary | ICD-10-CM | POA: Diagnosis not present

## 2020-02-21 DIAGNOSIS — K5904 Chronic idiopathic constipation: Secondary | ICD-10-CM | POA: Diagnosis not present

## 2020-02-21 DIAGNOSIS — E1165 Type 2 diabetes mellitus with hyperglycemia: Secondary | ICD-10-CM | POA: Diagnosis not present

## 2020-02-21 DIAGNOSIS — C9310 Chronic myelomonocytic leukemia not having achieved remission: Secondary | ICD-10-CM | POA: Diagnosis not present

## 2020-02-21 DIAGNOSIS — Q794 Prune belly syndrome: Secondary | ICD-10-CM | POA: Diagnosis not present

## 2020-02-21 DIAGNOSIS — N289 Disorder of kidney and ureter, unspecified: Secondary | ICD-10-CM | POA: Diagnosis not present

## 2020-02-21 DIAGNOSIS — T827XXD Infection and inflammatory reaction due to other cardiac and vascular devices, implants and grafts, subsequent encounter: Secondary | ICD-10-CM | POA: Diagnosis not present

## 2020-02-21 DIAGNOSIS — K5909 Other constipation: Secondary | ICD-10-CM | POA: Diagnosis not present

## 2020-02-21 DIAGNOSIS — R030 Elevated blood-pressure reading, without diagnosis of hypertension: Secondary | ICD-10-CM | POA: Diagnosis not present

## 2020-02-21 DIAGNOSIS — Z139 Encounter for screening, unspecified: Secondary | ICD-10-CM | POA: Diagnosis not present

## 2020-02-21 DIAGNOSIS — Z452 Encounter for adjustment and management of vascular access device: Secondary | ICD-10-CM | POA: Diagnosis not present

## 2020-02-21 DIAGNOSIS — G629 Polyneuropathy, unspecified: Secondary | ICD-10-CM | POA: Diagnosis not present

## 2020-02-21 DIAGNOSIS — R071 Chest pain on breathing: Secondary | ICD-10-CM | POA: Diagnosis not present

## 2020-02-21 DIAGNOSIS — H25013 Cortical age-related cataract, bilateral: Secondary | ICD-10-CM | POA: Diagnosis not present

## 2020-02-21 DIAGNOSIS — I700 Atherosclerosis of aorta: Secondary | ICD-10-CM | POA: Diagnosis not present

## 2020-02-21 DIAGNOSIS — D759 Disease of blood and blood-forming organs, unspecified: Secondary | ICD-10-CM | POA: Diagnosis not present

## 2020-02-21 DIAGNOSIS — Z20822 Contact with and (suspected) exposure to covid-19: Secondary | ICD-10-CM | POA: Diagnosis not present

## 2020-02-21 DIAGNOSIS — R432 Parageusia: Secondary | ICD-10-CM | POA: Diagnosis not present

## 2020-02-21 DIAGNOSIS — R3121 Asymptomatic microscopic hematuria: Secondary | ICD-10-CM | POA: Diagnosis not present

## 2020-02-21 DIAGNOSIS — K604 Rectal fistula: Secondary | ICD-10-CM | POA: Diagnosis not present

## 2020-02-21 DIAGNOSIS — R1111 Vomiting without nausea: Secondary | ICD-10-CM | POA: Diagnosis not present

## 2020-02-21 DIAGNOSIS — D72820 Lymphocytosis (symptomatic): Secondary | ICD-10-CM | POA: Diagnosis not present

## 2020-02-21 DIAGNOSIS — L905 Scar conditions and fibrosis of skin: Secondary | ICD-10-CM | POA: Diagnosis not present

## 2020-02-21 DIAGNOSIS — K625 Hemorrhage of anus and rectum: Secondary | ICD-10-CM | POA: Diagnosis not present

## 2020-02-21 DIAGNOSIS — G5702 Lesion of sciatic nerve, left lower limb: Secondary | ICD-10-CM | POA: Diagnosis not present

## 2020-02-21 DIAGNOSIS — H4720 Unspecified optic atrophy: Secondary | ICD-10-CM | POA: Diagnosis not present

## 2020-02-21 DIAGNOSIS — Z7984 Long term (current) use of oral hypoglycemic drugs: Secondary | ICD-10-CM | POA: Diagnosis not present

## 2020-02-21 DIAGNOSIS — H35372 Puckering of macula, left eye: Secondary | ICD-10-CM | POA: Diagnosis not present

## 2020-02-21 DIAGNOSIS — E559 Vitamin D deficiency, unspecified: Secondary | ICD-10-CM | POA: Diagnosis not present

## 2020-02-21 DIAGNOSIS — M7581 Other shoulder lesions, right shoulder: Secondary | ICD-10-CM | POA: Diagnosis not present

## 2020-02-21 DIAGNOSIS — I788 Other diseases of capillaries: Secondary | ICD-10-CM | POA: Diagnosis not present

## 2020-02-21 DIAGNOSIS — E0590 Thyrotoxicosis, unspecified without thyrotoxic crisis or storm: Secondary | ICD-10-CM | POA: Diagnosis not present

## 2020-02-21 DIAGNOSIS — M25672 Stiffness of left ankle, not elsewhere classified: Secondary | ICD-10-CM | POA: Diagnosis not present

## 2020-02-21 DIAGNOSIS — G119 Hereditary ataxia, unspecified: Secondary | ICD-10-CM | POA: Diagnosis not present

## 2020-02-21 DIAGNOSIS — M1712 Unilateral primary osteoarthritis, left knee: Secondary | ICD-10-CM | POA: Diagnosis not present

## 2020-02-21 DIAGNOSIS — K838 Other specified diseases of biliary tract: Secondary | ICD-10-CM | POA: Diagnosis not present

## 2020-02-21 DIAGNOSIS — Z4789 Encounter for other orthopedic aftercare: Secondary | ICD-10-CM | POA: Diagnosis not present

## 2020-02-21 DIAGNOSIS — C44320 Squamous cell carcinoma of skin of unspecified parts of face: Secondary | ICD-10-CM | POA: Diagnosis not present

## 2020-02-21 DIAGNOSIS — N200 Calculus of kidney: Secondary | ICD-10-CM | POA: Diagnosis not present

## 2020-02-21 DIAGNOSIS — Z170 Estrogen receptor positive status [ER+]: Secondary | ICD-10-CM | POA: Diagnosis not present

## 2020-02-21 DIAGNOSIS — J302 Other seasonal allergic rhinitis: Secondary | ICD-10-CM | POA: Diagnosis not present

## 2020-02-21 DIAGNOSIS — C155 Malignant neoplasm of lower third of esophagus: Secondary | ICD-10-CM | POA: Diagnosis not present

## 2020-02-21 DIAGNOSIS — Z8546 Personal history of malignant neoplasm of prostate: Secondary | ICD-10-CM | POA: Diagnosis not present

## 2020-02-21 DIAGNOSIS — Z1331 Encounter for screening for depression: Secondary | ICD-10-CM | POA: Diagnosis not present

## 2020-02-21 DIAGNOSIS — R079 Chest pain, unspecified: Secondary | ICD-10-CM | POA: Diagnosis not present

## 2020-02-21 DIAGNOSIS — K2900 Acute gastritis without bleeding: Secondary | ICD-10-CM | POA: Diagnosis not present

## 2020-02-21 DIAGNOSIS — B352 Tinea manuum: Secondary | ICD-10-CM | POA: Diagnosis not present

## 2020-02-21 DIAGNOSIS — F900 Attention-deficit hyperactivity disorder, predominantly inattentive type: Secondary | ICD-10-CM | POA: Diagnosis not present

## 2020-02-21 DIAGNOSIS — M1990 Unspecified osteoarthritis, unspecified site: Secondary | ICD-10-CM | POA: Diagnosis not present

## 2020-02-21 DIAGNOSIS — M4727 Other spondylosis with radiculopathy, lumbosacral region: Secondary | ICD-10-CM | POA: Diagnosis not present

## 2020-02-21 DIAGNOSIS — R232 Flushing: Secondary | ICD-10-CM | POA: Diagnosis not present

## 2020-02-21 DIAGNOSIS — Z6827 Body mass index (BMI) 27.0-27.9, adult: Secondary | ICD-10-CM | POA: Diagnosis not present

## 2020-02-21 DIAGNOSIS — N529 Male erectile dysfunction, unspecified: Secondary | ICD-10-CM | POA: Diagnosis not present

## 2020-02-21 DIAGNOSIS — Z1211 Encounter for screening for malignant neoplasm of colon: Secondary | ICD-10-CM | POA: Diagnosis not present

## 2020-02-21 DIAGNOSIS — H353221 Exudative age-related macular degeneration, left eye, with active choroidal neovascularization: Secondary | ICD-10-CM | POA: Diagnosis not present

## 2020-02-21 DIAGNOSIS — R9389 Abnormal findings on diagnostic imaging of other specified body structures: Secondary | ICD-10-CM | POA: Diagnosis not present

## 2020-02-21 DIAGNOSIS — H35363 Drusen (degenerative) of macula, bilateral: Secondary | ICD-10-CM | POA: Diagnosis not present

## 2020-02-21 DIAGNOSIS — R635 Abnormal weight gain: Secondary | ICD-10-CM | POA: Diagnosis not present

## 2020-02-21 DIAGNOSIS — D751 Secondary polycythemia: Secondary | ICD-10-CM | POA: Diagnosis not present

## 2020-02-21 DIAGNOSIS — S161XXA Strain of muscle, fascia and tendon at neck level, initial encounter: Secondary | ICD-10-CM | POA: Diagnosis not present

## 2020-02-21 DIAGNOSIS — D508 Other iron deficiency anemias: Secondary | ICD-10-CM | POA: Diagnosis not present

## 2020-02-21 DIAGNOSIS — I9589 Other hypotension: Secondary | ICD-10-CM | POA: Diagnosis not present

## 2020-02-21 DIAGNOSIS — R002 Palpitations: Secondary | ICD-10-CM | POA: Diagnosis not present

## 2020-02-21 DIAGNOSIS — R259 Unspecified abnormal involuntary movements: Secondary | ICD-10-CM | POA: Diagnosis not present

## 2020-02-21 DIAGNOSIS — C541 Malignant neoplasm of endometrium: Secondary | ICD-10-CM | POA: Diagnosis not present

## 2020-02-21 DIAGNOSIS — J432 Centrilobular emphysema: Secondary | ICD-10-CM | POA: Diagnosis not present

## 2020-02-21 DIAGNOSIS — Z6822 Body mass index (BMI) 22.0-22.9, adult: Secondary | ICD-10-CM | POA: Diagnosis not present

## 2020-02-21 DIAGNOSIS — M05741 Rheumatoid arthritis with rheumatoid factor of right hand without organ or systems involvement: Secondary | ICD-10-CM | POA: Diagnosis not present

## 2020-02-21 DIAGNOSIS — H35411 Lattice degeneration of retina, right eye: Secondary | ICD-10-CM | POA: Diagnosis not present

## 2020-02-21 DIAGNOSIS — H47092 Other disorders of optic nerve, not elsewhere classified, left eye: Secondary | ICD-10-CM | POA: Diagnosis not present

## 2020-02-21 DIAGNOSIS — M25512 Pain in left shoulder: Secondary | ICD-10-CM | POA: Diagnosis not present

## 2020-02-21 DIAGNOSIS — F3289 Other specified depressive episodes: Secondary | ICD-10-CM | POA: Diagnosis not present

## 2020-02-21 DIAGNOSIS — I119 Hypertensive heart disease without heart failure: Secondary | ICD-10-CM | POA: Diagnosis not present

## 2020-02-21 DIAGNOSIS — Z5320 Procedure and treatment not carried out because of patient's decision for unspecified reasons: Secondary | ICD-10-CM | POA: Diagnosis not present

## 2020-02-21 DIAGNOSIS — J8410 Pulmonary fibrosis, unspecified: Secondary | ICD-10-CM | POA: Diagnosis not present

## 2020-02-21 DIAGNOSIS — K610 Anal abscess: Secondary | ICD-10-CM | POA: Diagnosis not present

## 2020-02-21 DIAGNOSIS — E2740 Unspecified adrenocortical insufficiency: Secondary | ICD-10-CM | POA: Diagnosis not present

## 2020-02-21 DIAGNOSIS — N281 Cyst of kidney, acquired: Secondary | ICD-10-CM | POA: Diagnosis not present

## 2020-02-21 DIAGNOSIS — M9905 Segmental and somatic dysfunction of pelvic region: Secondary | ICD-10-CM | POA: Diagnosis not present

## 2020-02-21 DIAGNOSIS — T451X5A Adverse effect of antineoplastic and immunosuppressive drugs, initial encounter: Secondary | ICD-10-CM | POA: Diagnosis not present

## 2020-02-21 DIAGNOSIS — G620 Drug-induced polyneuropathy: Secondary | ICD-10-CM | POA: Diagnosis not present

## 2020-02-21 DIAGNOSIS — Z5181 Encounter for therapeutic drug level monitoring: Secondary | ICD-10-CM | POA: Diagnosis not present

## 2020-02-21 DIAGNOSIS — J029 Acute pharyngitis, unspecified: Secondary | ICD-10-CM | POA: Diagnosis not present

## 2020-02-21 DIAGNOSIS — Z0001 Encounter for general adult medical examination with abnormal findings: Secondary | ICD-10-CM | POA: Diagnosis not present

## 2020-02-21 DIAGNOSIS — Z6835 Body mass index (BMI) 35.0-35.9, adult: Secondary | ICD-10-CM | POA: Diagnosis not present

## 2020-02-21 DIAGNOSIS — I9581 Postprocedural hypotension: Secondary | ICD-10-CM | POA: Diagnosis not present

## 2020-02-21 DIAGNOSIS — Z5111 Encounter for antineoplastic chemotherapy: Secondary | ICD-10-CM | POA: Diagnosis not present

## 2020-02-21 DIAGNOSIS — M79645 Pain in left finger(s): Secondary | ICD-10-CM | POA: Diagnosis not present

## 2020-02-21 DIAGNOSIS — K5901 Slow transit constipation: Secondary | ICD-10-CM | POA: Diagnosis not present

## 2020-02-21 DIAGNOSIS — I5031 Acute diastolic (congestive) heart failure: Secondary | ICD-10-CM | POA: Diagnosis not present

## 2020-02-21 DIAGNOSIS — G479 Sleep disorder, unspecified: Secondary | ICD-10-CM | POA: Diagnosis not present

## 2020-02-21 DIAGNOSIS — E038 Other specified hypothyroidism: Secondary | ICD-10-CM | POA: Diagnosis not present

## 2020-02-21 DIAGNOSIS — M48061 Spinal stenosis, lumbar region without neurogenic claudication: Secondary | ICD-10-CM | POA: Diagnosis not present

## 2020-02-21 DIAGNOSIS — R2689 Other abnormalities of gait and mobility: Secondary | ICD-10-CM | POA: Diagnosis not present

## 2020-02-21 DIAGNOSIS — Z89432 Acquired absence of left foot: Secondary | ICD-10-CM | POA: Diagnosis not present

## 2020-02-21 DIAGNOSIS — N6321 Unspecified lump in the left breast, upper outer quadrant: Secondary | ICD-10-CM | POA: Diagnosis not present

## 2020-02-21 DIAGNOSIS — C9002 Multiple myeloma in relapse: Secondary | ICD-10-CM | POA: Diagnosis not present

## 2020-02-21 DIAGNOSIS — I6330 Cerebral infarction due to thrombosis of unspecified cerebral artery: Secondary | ICD-10-CM | POA: Diagnosis not present

## 2020-02-21 DIAGNOSIS — N6489 Other specified disorders of breast: Secondary | ICD-10-CM | POA: Diagnosis not present

## 2020-02-21 DIAGNOSIS — R300 Dysuria: Secondary | ICD-10-CM | POA: Diagnosis not present

## 2020-02-21 DIAGNOSIS — M4312 Spondylolisthesis, cervical region: Secondary | ICD-10-CM | POA: Diagnosis not present

## 2020-02-21 DIAGNOSIS — M2041 Other hammer toe(s) (acquired), right foot: Secondary | ICD-10-CM | POA: Diagnosis not present

## 2020-02-21 DIAGNOSIS — Z8739 Personal history of other diseases of the musculoskeletal system and connective tissue: Secondary | ICD-10-CM | POA: Diagnosis not present

## 2020-02-21 DIAGNOSIS — C4480 Unspecified malignant neoplasm of overlapping sites of skin: Secondary | ICD-10-CM | POA: Diagnosis not present

## 2020-02-21 DIAGNOSIS — N401 Enlarged prostate with lower urinary tract symptoms: Secondary | ICD-10-CM | POA: Diagnosis not present

## 2020-02-21 DIAGNOSIS — I959 Hypotension, unspecified: Secondary | ICD-10-CM | POA: Diagnosis not present

## 2020-02-21 DIAGNOSIS — H9202 Otalgia, left ear: Secondary | ICD-10-CM | POA: Diagnosis not present

## 2020-02-21 DIAGNOSIS — C9110 Chronic lymphocytic leukemia of B-cell type not having achieved remission: Secondary | ICD-10-CM | POA: Diagnosis not present

## 2020-02-21 DIAGNOSIS — Z7189 Other specified counseling: Secondary | ICD-10-CM | POA: Diagnosis not present

## 2020-02-21 DIAGNOSIS — Z955 Presence of coronary angioplasty implant and graft: Secondary | ICD-10-CM | POA: Diagnosis not present

## 2020-02-21 DIAGNOSIS — I639 Cerebral infarction, unspecified: Secondary | ICD-10-CM | POA: Diagnosis not present

## 2020-02-21 DIAGNOSIS — Z9049 Acquired absence of other specified parts of digestive tract: Secondary | ICD-10-CM | POA: Diagnosis not present

## 2020-02-21 DIAGNOSIS — R928 Other abnormal and inconclusive findings on diagnostic imaging of breast: Secondary | ICD-10-CM | POA: Diagnosis not present

## 2020-02-21 DIAGNOSIS — I493 Ventricular premature depolarization: Secondary | ICD-10-CM | POA: Diagnosis not present

## 2020-02-21 DIAGNOSIS — C73 Malignant neoplasm of thyroid gland: Secondary | ICD-10-CM | POA: Diagnosis not present

## 2020-02-21 DIAGNOSIS — T466X5A Adverse effect of antihyperlipidemic and antiarteriosclerotic drugs, initial encounter: Secondary | ICD-10-CM | POA: Diagnosis not present

## 2020-02-21 DIAGNOSIS — Z96641 Presence of right artificial hip joint: Secondary | ICD-10-CM | POA: Diagnosis not present

## 2020-02-21 DIAGNOSIS — I712 Thoracic aortic aneurysm, without rupture: Secondary | ICD-10-CM | POA: Diagnosis not present

## 2020-02-21 DIAGNOSIS — E876 Hypokalemia: Secondary | ICD-10-CM | POA: Diagnosis not present

## 2020-02-21 DIAGNOSIS — G4733 Obstructive sleep apnea (adult) (pediatric): Secondary | ICD-10-CM | POA: Diagnosis not present

## 2020-02-21 DIAGNOSIS — M533 Sacrococcygeal disorders, not elsewhere classified: Secondary | ICD-10-CM | POA: Diagnosis not present

## 2020-02-21 DIAGNOSIS — G35 Multiple sclerosis: Secondary | ICD-10-CM | POA: Diagnosis not present

## 2020-02-21 DIAGNOSIS — T782XXD Anaphylactic shock, unspecified, subsequent encounter: Secondary | ICD-10-CM | POA: Diagnosis not present

## 2020-02-21 DIAGNOSIS — F3340 Major depressive disorder, recurrent, in remission, unspecified: Secondary | ICD-10-CM | POA: Diagnosis not present

## 2020-02-21 DIAGNOSIS — K580 Irritable bowel syndrome with diarrhea: Secondary | ICD-10-CM | POA: Diagnosis not present

## 2020-02-21 DIAGNOSIS — R451 Restlessness and agitation: Secondary | ICD-10-CM | POA: Diagnosis not present

## 2020-02-21 DIAGNOSIS — M8000XD Age-related osteoporosis with current pathological fracture, unspecified site, subsequent encounter for fracture with routine healing: Secondary | ICD-10-CM | POA: Diagnosis not present

## 2020-02-21 DIAGNOSIS — H52203 Unspecified astigmatism, bilateral: Secondary | ICD-10-CM | POA: Diagnosis not present

## 2020-02-21 DIAGNOSIS — M6281 Muscle weakness (generalized): Secondary | ICD-10-CM | POA: Diagnosis not present

## 2020-02-21 DIAGNOSIS — M7742 Metatarsalgia, left foot: Secondary | ICD-10-CM | POA: Diagnosis not present

## 2020-02-21 DIAGNOSIS — D65 Disseminated intravascular coagulation [defibrination syndrome]: Secondary | ICD-10-CM | POA: Diagnosis not present

## 2020-02-21 DIAGNOSIS — H02834 Dermatochalasis of left upper eyelid: Secondary | ICD-10-CM | POA: Diagnosis not present

## 2020-02-21 DIAGNOSIS — W010XXA Fall on same level from slipping, tripping and stumbling without subsequent striking against object, initial encounter: Secondary | ICD-10-CM | POA: Diagnosis not present

## 2020-02-21 DIAGNOSIS — M21941 Unspecified acquired deformity of hand, right hand: Secondary | ICD-10-CM | POA: Diagnosis not present

## 2020-02-21 DIAGNOSIS — S92309A Fracture of unspecified metatarsal bone(s), unspecified foot, initial encounter for closed fracture: Secondary | ICD-10-CM | POA: Diagnosis not present

## 2020-02-21 DIAGNOSIS — G3183 Dementia with Lewy bodies: Secondary | ICD-10-CM | POA: Diagnosis not present

## 2020-02-21 DIAGNOSIS — C8308 Small cell B-cell lymphoma, lymph nodes of multiple sites: Secondary | ICD-10-CM | POA: Diagnosis not present

## 2020-02-21 DIAGNOSIS — Z01810 Encounter for preprocedural cardiovascular examination: Secondary | ICD-10-CM | POA: Diagnosis not present

## 2020-02-21 DIAGNOSIS — I499 Cardiac arrhythmia, unspecified: Secondary | ICD-10-CM | POA: Diagnosis not present

## 2020-02-21 DIAGNOSIS — D510 Vitamin B12 deficiency anemia due to intrinsic factor deficiency: Secondary | ICD-10-CM | POA: Diagnosis not present

## 2020-02-21 DIAGNOSIS — D4620 Refractory anemia with excess of blasts, unspecified: Secondary | ICD-10-CM | POA: Diagnosis not present

## 2020-02-21 DIAGNOSIS — H9190 Unspecified hearing loss, unspecified ear: Secondary | ICD-10-CM | POA: Diagnosis not present

## 2020-02-21 DIAGNOSIS — H93A1 Pulsatile tinnitus, right ear: Secondary | ICD-10-CM | POA: Diagnosis not present

## 2020-02-21 DIAGNOSIS — N651 Disproportion of reconstructed breast: Secondary | ICD-10-CM | POA: Diagnosis not present

## 2020-02-21 DIAGNOSIS — Z7952 Long term (current) use of systemic steroids: Secondary | ICD-10-CM | POA: Diagnosis not present

## 2020-02-21 DIAGNOSIS — I429 Cardiomyopathy, unspecified: Secondary | ICD-10-CM | POA: Diagnosis not present

## 2020-02-21 DIAGNOSIS — K766 Portal hypertension: Secondary | ICD-10-CM | POA: Diagnosis not present

## 2020-02-21 DIAGNOSIS — K141 Geographic tongue: Secondary | ICD-10-CM | POA: Diagnosis not present

## 2020-02-21 DIAGNOSIS — R531 Weakness: Secondary | ICD-10-CM | POA: Diagnosis not present

## 2020-02-21 DIAGNOSIS — Z944 Liver transplant status: Secondary | ICD-10-CM | POA: Diagnosis not present

## 2020-02-21 DIAGNOSIS — I25110 Atherosclerotic heart disease of native coronary artery with unstable angina pectoris: Secondary | ICD-10-CM | POA: Diagnosis not present

## 2020-02-21 DIAGNOSIS — N171 Acute kidney failure with acute cortical necrosis: Secondary | ICD-10-CM | POA: Diagnosis not present

## 2020-02-21 DIAGNOSIS — S42214A Unspecified nondisplaced fracture of surgical neck of right humerus, initial encounter for closed fracture: Secondary | ICD-10-CM | POA: Diagnosis not present

## 2020-02-21 DIAGNOSIS — E878 Other disorders of electrolyte and fluid balance, not elsewhere classified: Secondary | ICD-10-CM | POA: Diagnosis not present

## 2020-02-21 DIAGNOSIS — H2513 Age-related nuclear cataract, bilateral: Secondary | ICD-10-CM | POA: Diagnosis not present

## 2020-02-21 DIAGNOSIS — R0781 Pleurodynia: Secondary | ICD-10-CM | POA: Diagnosis not present

## 2020-02-21 DIAGNOSIS — K660 Peritoneal adhesions (postprocedural) (postinfection): Secondary | ICD-10-CM | POA: Diagnosis not present

## 2020-02-21 DIAGNOSIS — M129 Arthropathy, unspecified: Secondary | ICD-10-CM | POA: Diagnosis not present

## 2020-02-21 DIAGNOSIS — B958 Unspecified staphylococcus as the cause of diseases classified elsewhere: Secondary | ICD-10-CM | POA: Diagnosis not present

## 2020-02-21 DIAGNOSIS — Z961 Presence of intraocular lens: Secondary | ICD-10-CM | POA: Diagnosis not present

## 2020-02-21 DIAGNOSIS — C182 Malignant neoplasm of ascending colon: Secondary | ICD-10-CM | POA: Diagnosis not present

## 2020-02-21 DIAGNOSIS — E2839 Other primary ovarian failure: Secondary | ICD-10-CM | POA: Diagnosis not present

## 2020-02-21 DIAGNOSIS — E118 Type 2 diabetes mellitus with unspecified complications: Secondary | ICD-10-CM | POA: Diagnosis not present

## 2020-02-21 DIAGNOSIS — G44209 Tension-type headache, unspecified, not intractable: Secondary | ICD-10-CM | POA: Diagnosis not present

## 2020-02-21 DIAGNOSIS — E039 Hypothyroidism, unspecified: Secondary | ICD-10-CM | POA: Diagnosis not present

## 2020-02-21 DIAGNOSIS — M1811 Unilateral primary osteoarthritis of first carpometacarpal joint, right hand: Secondary | ICD-10-CM | POA: Diagnosis not present

## 2020-02-21 DIAGNOSIS — Z48812 Encounter for surgical aftercare following surgery on the circulatory system: Secondary | ICD-10-CM | POA: Diagnosis not present

## 2020-02-21 DIAGNOSIS — F1010 Alcohol abuse, uncomplicated: Secondary | ICD-10-CM | POA: Diagnosis not present

## 2020-02-21 DIAGNOSIS — G8911 Acute pain due to trauma: Secondary | ICD-10-CM | POA: Diagnosis not present

## 2020-02-21 DIAGNOSIS — R52 Pain, unspecified: Secondary | ICD-10-CM | POA: Diagnosis not present

## 2020-02-21 DIAGNOSIS — M5416 Radiculopathy, lumbar region: Secondary | ICD-10-CM | POA: Diagnosis not present

## 2020-02-21 DIAGNOSIS — R0902 Hypoxemia: Secondary | ICD-10-CM | POA: Diagnosis not present

## 2020-02-21 DIAGNOSIS — R2990 Unspecified symptoms and signs involving the nervous system: Secondary | ICD-10-CM | POA: Diagnosis not present

## 2020-02-21 DIAGNOSIS — S51012A Laceration without foreign body of left elbow, initial encounter: Secondary | ICD-10-CM | POA: Diagnosis not present

## 2020-02-21 DIAGNOSIS — C678 Malignant neoplasm of overlapping sites of bladder: Secondary | ICD-10-CM | POA: Diagnosis not present

## 2020-02-21 DIAGNOSIS — S82842D Displaced bimalleolar fracture of left lower leg, subsequent encounter for closed fracture with routine healing: Secondary | ICD-10-CM | POA: Diagnosis not present

## 2020-02-21 DIAGNOSIS — C642 Malignant neoplasm of left kidney, except renal pelvis: Secondary | ICD-10-CM | POA: Diagnosis not present

## 2020-02-21 DIAGNOSIS — R0600 Dyspnea, unspecified: Secondary | ICD-10-CM | POA: Diagnosis not present

## 2020-02-21 DIAGNOSIS — E890 Postprocedural hypothyroidism: Secondary | ICD-10-CM | POA: Diagnosis not present

## 2020-02-21 DIAGNOSIS — E041 Nontoxic single thyroid nodule: Secondary | ICD-10-CM | POA: Diagnosis not present

## 2020-02-21 DIAGNOSIS — M25559 Pain in unspecified hip: Secondary | ICD-10-CM | POA: Diagnosis not present

## 2020-02-21 DIAGNOSIS — R922 Inconclusive mammogram: Secondary | ICD-10-CM | POA: Diagnosis not present

## 2020-02-21 DIAGNOSIS — L111 Transient acantholytic dermatosis [Grover]: Secondary | ICD-10-CM | POA: Diagnosis not present

## 2020-02-21 DIAGNOSIS — I129 Hypertensive chronic kidney disease with stage 1 through stage 4 chronic kidney disease, or unspecified chronic kidney disease: Secondary | ICD-10-CM | POA: Diagnosis not present

## 2020-02-21 DIAGNOSIS — L812 Freckles: Secondary | ICD-10-CM | POA: Diagnosis not present

## 2020-02-21 DIAGNOSIS — G894 Chronic pain syndrome: Secondary | ICD-10-CM | POA: Diagnosis not present

## 2020-02-21 DIAGNOSIS — B1920 Unspecified viral hepatitis C without hepatic coma: Secondary | ICD-10-CM | POA: Diagnosis not present

## 2020-02-21 DIAGNOSIS — G250 Essential tremor: Secondary | ICD-10-CM | POA: Diagnosis not present

## 2020-02-21 DIAGNOSIS — S46011D Strain of muscle(s) and tendon(s) of the rotator cuff of right shoulder, subsequent encounter: Secondary | ICD-10-CM | POA: Diagnosis not present

## 2020-02-21 DIAGNOSIS — I4819 Other persistent atrial fibrillation: Secondary | ICD-10-CM | POA: Diagnosis not present

## 2020-02-21 DIAGNOSIS — M25569 Pain in unspecified knee: Secondary | ICD-10-CM | POA: Diagnosis not present

## 2020-02-21 DIAGNOSIS — Z85038 Personal history of other malignant neoplasm of large intestine: Secondary | ICD-10-CM | POA: Diagnosis not present

## 2020-02-21 DIAGNOSIS — M05742 Rheumatoid arthritis with rheumatoid factor of left hand without organ or systems involvement: Secondary | ICD-10-CM | POA: Diagnosis not present

## 2020-02-21 DIAGNOSIS — M955 Acquired deformity of pelvis: Secondary | ICD-10-CM | POA: Diagnosis not present

## 2020-02-21 DIAGNOSIS — I455 Other specified heart block: Secondary | ICD-10-CM | POA: Diagnosis not present

## 2020-02-21 DIAGNOSIS — M5410 Radiculopathy, site unspecified: Secondary | ICD-10-CM | POA: Diagnosis not present

## 2020-02-21 DIAGNOSIS — C44619 Basal cell carcinoma of skin of left upper limb, including shoulder: Secondary | ICD-10-CM | POA: Diagnosis not present

## 2020-02-21 DIAGNOSIS — H16223 Keratoconjunctivitis sicca, not specified as Sjogren's, bilateral: Secondary | ICD-10-CM | POA: Diagnosis not present

## 2020-02-21 DIAGNOSIS — R0789 Other chest pain: Secondary | ICD-10-CM | POA: Diagnosis not present

## 2020-02-21 DIAGNOSIS — E872 Acidosis: Secondary | ICD-10-CM | POA: Diagnosis not present

## 2020-02-21 DIAGNOSIS — Z853 Personal history of malignant neoplasm of breast: Secondary | ICD-10-CM | POA: Diagnosis not present

## 2020-02-21 DIAGNOSIS — M791 Myalgia, unspecified site: Secondary | ICD-10-CM | POA: Diagnosis not present

## 2020-02-21 DIAGNOSIS — Z789 Other specified health status: Secondary | ICD-10-CM | POA: Diagnosis not present

## 2020-02-21 DIAGNOSIS — D472 Monoclonal gammopathy: Secondary | ICD-10-CM | POA: Diagnosis not present

## 2020-02-21 DIAGNOSIS — M79671 Pain in right foot: Secondary | ICD-10-CM | POA: Diagnosis not present

## 2020-02-21 DIAGNOSIS — I739 Peripheral vascular disease, unspecified: Secondary | ICD-10-CM | POA: Diagnosis not present

## 2020-02-21 DIAGNOSIS — J018 Other acute sinusitis: Secondary | ICD-10-CM | POA: Diagnosis not present

## 2020-02-21 DIAGNOSIS — M9901 Segmental and somatic dysfunction of cervical region: Secondary | ICD-10-CM | POA: Diagnosis not present

## 2020-02-21 DIAGNOSIS — Z905 Acquired absence of kidney: Secondary | ICD-10-CM | POA: Diagnosis not present

## 2020-02-21 DIAGNOSIS — M25462 Effusion, left knee: Secondary | ICD-10-CM | POA: Diagnosis not present

## 2020-02-21 DIAGNOSIS — L304 Erythema intertrigo: Secondary | ICD-10-CM | POA: Diagnosis not present

## 2020-02-21 DIAGNOSIS — S32030A Wedge compression fracture of third lumbar vertebra, initial encounter for closed fracture: Secondary | ICD-10-CM | POA: Diagnosis not present

## 2020-02-21 DIAGNOSIS — C158 Malignant neoplasm of overlapping sites of esophagus: Secondary | ICD-10-CM | POA: Diagnosis not present

## 2020-02-21 DIAGNOSIS — F17210 Nicotine dependence, cigarettes, uncomplicated: Secondary | ICD-10-CM | POA: Diagnosis not present

## 2020-02-21 DIAGNOSIS — L97522 Non-pressure chronic ulcer of other part of left foot with fat layer exposed: Secondary | ICD-10-CM | POA: Diagnosis not present

## 2020-02-21 DIAGNOSIS — K648 Other hemorrhoids: Secondary | ICD-10-CM | POA: Diagnosis not present

## 2020-02-21 DIAGNOSIS — N904 Leukoplakia of vulva: Secondary | ICD-10-CM | POA: Diagnosis not present

## 2020-02-21 DIAGNOSIS — Z13228 Encounter for screening for other metabolic disorders: Secondary | ICD-10-CM | POA: Diagnosis not present

## 2020-02-21 DIAGNOSIS — J209 Acute bronchitis, unspecified: Secondary | ICD-10-CM | POA: Diagnosis not present

## 2020-02-21 DIAGNOSIS — F0280 Dementia in other diseases classified elsewhere without behavioral disturbance: Secondary | ICD-10-CM | POA: Diagnosis not present

## 2020-02-21 DIAGNOSIS — D481 Neoplasm of uncertain behavior of connective and other soft tissue: Secondary | ICD-10-CM | POA: Diagnosis not present

## 2020-02-21 DIAGNOSIS — R102 Pelvic and perineal pain: Secondary | ICD-10-CM | POA: Diagnosis not present

## 2020-02-21 DIAGNOSIS — Z6828 Body mass index (BMI) 28.0-28.9, adult: Secondary | ICD-10-CM | POA: Diagnosis not present

## 2020-02-21 DIAGNOSIS — N898 Other specified noninflammatory disorders of vagina: Secondary | ICD-10-CM | POA: Diagnosis not present

## 2020-02-21 DIAGNOSIS — N3592 Unspecified urethral stricture, female: Secondary | ICD-10-CM | POA: Diagnosis not present

## 2020-02-21 DIAGNOSIS — U071 COVID-19: Secondary | ICD-10-CM | POA: Diagnosis not present

## 2020-02-21 DIAGNOSIS — L0231 Cutaneous abscess of buttock: Secondary | ICD-10-CM | POA: Diagnosis not present

## 2020-02-21 DIAGNOSIS — Z794 Long term (current) use of insulin: Secondary | ICD-10-CM | POA: Diagnosis not present

## 2020-02-21 DIAGNOSIS — M67441 Ganglion, right hand: Secondary | ICD-10-CM | POA: Diagnosis not present

## 2020-02-21 DIAGNOSIS — Z23 Encounter for immunization: Secondary | ICD-10-CM | POA: Diagnosis not present

## 2020-02-21 DIAGNOSIS — K228 Other specified diseases of esophagus: Secondary | ICD-10-CM | POA: Diagnosis not present

## 2020-02-21 DIAGNOSIS — S62635D Displaced fracture of distal phalanx of left ring finger, subsequent encounter for fracture with routine healing: Secondary | ICD-10-CM | POA: Diagnosis not present

## 2020-02-21 DIAGNOSIS — N139 Obstructive and reflux uropathy, unspecified: Secondary | ICD-10-CM | POA: Diagnosis not present

## 2020-02-21 DIAGNOSIS — H43393 Other vitreous opacities, bilateral: Secondary | ICD-10-CM | POA: Diagnosis not present

## 2020-02-21 DIAGNOSIS — Z79890 Hormone replacement therapy (postmenopausal): Secondary | ICD-10-CM | POA: Diagnosis not present

## 2020-02-21 DIAGNOSIS — K922 Gastrointestinal hemorrhage, unspecified: Secondary | ICD-10-CM | POA: Diagnosis not present

## 2020-02-21 DIAGNOSIS — H5203 Hypermetropia, bilateral: Secondary | ICD-10-CM | POA: Diagnosis not present

## 2020-02-21 DIAGNOSIS — I509 Heart failure, unspecified: Secondary | ICD-10-CM | POA: Diagnosis not present

## 2020-02-21 DIAGNOSIS — H02403 Unspecified ptosis of bilateral eyelids: Secondary | ICD-10-CM | POA: Diagnosis not present

## 2020-02-21 DIAGNOSIS — Z8719 Personal history of other diseases of the digestive system: Secondary | ICD-10-CM | POA: Diagnosis not present

## 2020-02-21 DIAGNOSIS — R198 Other specified symptoms and signs involving the digestive system and abdomen: Secondary | ICD-10-CM | POA: Diagnosis not present

## 2020-02-21 DIAGNOSIS — H40053 Ocular hypertension, bilateral: Secondary | ICD-10-CM | POA: Diagnosis not present

## 2020-02-21 DIAGNOSIS — G2581 Restless legs syndrome: Secondary | ICD-10-CM | POA: Diagnosis not present

## 2020-02-21 DIAGNOSIS — Z720 Tobacco use: Secondary | ICD-10-CM | POA: Diagnosis not present

## 2020-02-21 DIAGNOSIS — M1711 Unilateral primary osteoarthritis, right knee: Secondary | ICD-10-CM | POA: Diagnosis not present

## 2020-02-21 DIAGNOSIS — M359 Systemic involvement of connective tissue, unspecified: Secondary | ICD-10-CM | POA: Diagnosis not present

## 2020-02-21 DIAGNOSIS — X32XXXA Exposure to sunlight, initial encounter: Secondary | ICD-10-CM | POA: Diagnosis not present

## 2020-02-21 DIAGNOSIS — G478 Other sleep disorders: Secondary | ICD-10-CM | POA: Diagnosis not present

## 2020-02-21 DIAGNOSIS — R7301 Impaired fasting glucose: Secondary | ICD-10-CM | POA: Diagnosis not present

## 2020-02-21 DIAGNOSIS — I358 Other nonrheumatic aortic valve disorders: Secondary | ICD-10-CM | POA: Diagnosis not present

## 2020-02-21 DIAGNOSIS — K2100 Gastro-esophageal reflux disease with esophagitis, without bleeding: Secondary | ICD-10-CM | POA: Diagnosis not present

## 2020-02-21 DIAGNOSIS — G300 Alzheimer's disease with early onset: Secondary | ICD-10-CM | POA: Diagnosis not present

## 2020-02-21 DIAGNOSIS — Z1159 Encounter for screening for other viral diseases: Secondary | ICD-10-CM | POA: Diagnosis not present

## 2020-02-21 DIAGNOSIS — N3944 Nocturnal enuresis: Secondary | ICD-10-CM | POA: Diagnosis not present

## 2020-02-21 DIAGNOSIS — D696 Thrombocytopenia, unspecified: Secondary | ICD-10-CM | POA: Diagnosis not present

## 2020-02-21 DIAGNOSIS — R001 Bradycardia, unspecified: Secondary | ICD-10-CM | POA: Diagnosis not present

## 2020-02-21 DIAGNOSIS — I6529 Occlusion and stenosis of unspecified carotid artery: Secondary | ICD-10-CM | POA: Diagnosis not present

## 2020-02-21 DIAGNOSIS — M2550 Pain in unspecified joint: Secondary | ICD-10-CM | POA: Diagnosis not present

## 2020-02-21 DIAGNOSIS — Z299 Encounter for prophylactic measures, unspecified: Secondary | ICD-10-CM | POA: Diagnosis not present

## 2020-02-21 DIAGNOSIS — M85852 Other specified disorders of bone density and structure, left thigh: Secondary | ICD-10-CM | POA: Diagnosis not present

## 2020-02-21 DIAGNOSIS — I2584 Coronary atherosclerosis due to calcified coronary lesion: Secondary | ICD-10-CM | POA: Diagnosis not present

## 2020-02-21 DIAGNOSIS — E8881 Metabolic syndrome: Secondary | ICD-10-CM | POA: Diagnosis not present

## 2020-02-21 DIAGNOSIS — Z9011 Acquired absence of right breast and nipple: Secondary | ICD-10-CM | POA: Diagnosis not present

## 2020-02-21 DIAGNOSIS — H18413 Arcus senilis, bilateral: Secondary | ICD-10-CM | POA: Diagnosis not present

## 2020-02-21 DIAGNOSIS — Z85528 Personal history of other malignant neoplasm of kidney: Secondary | ICD-10-CM | POA: Diagnosis not present

## 2020-02-21 DIAGNOSIS — S32010A Wedge compression fracture of first lumbar vertebra, initial encounter for closed fracture: Secondary | ICD-10-CM | POA: Diagnosis not present

## 2020-02-21 DIAGNOSIS — M0609 Rheumatoid arthritis without rheumatoid factor, multiple sites: Secondary | ICD-10-CM | POA: Diagnosis not present

## 2020-02-21 DIAGNOSIS — S32511D Fracture of superior rim of right pubis, subsequent encounter for fracture with routine healing: Secondary | ICD-10-CM | POA: Diagnosis not present

## 2020-02-21 DIAGNOSIS — R5382 Chronic fatigue, unspecified: Secondary | ICD-10-CM | POA: Diagnosis not present

## 2020-02-21 DIAGNOSIS — M47812 Spondylosis without myelopathy or radiculopathy, cervical region: Secondary | ICD-10-CM | POA: Diagnosis not present

## 2020-02-21 DIAGNOSIS — M67431 Ganglion, right wrist: Secondary | ICD-10-CM | POA: Diagnosis not present

## 2020-02-21 DIAGNOSIS — G9340 Encephalopathy, unspecified: Secondary | ICD-10-CM | POA: Diagnosis not present

## 2020-02-21 DIAGNOSIS — E034 Atrophy of thyroid (acquired): Secondary | ICD-10-CM | POA: Diagnosis not present

## 2020-02-21 DIAGNOSIS — Z1389 Encounter for screening for other disorder: Secondary | ICD-10-CM | POA: Diagnosis not present

## 2020-02-21 DIAGNOSIS — W19XXXD Unspecified fall, subsequent encounter: Secondary | ICD-10-CM | POA: Diagnosis not present

## 2020-02-21 DIAGNOSIS — C7951 Secondary malignant neoplasm of bone: Secondary | ICD-10-CM | POA: Diagnosis not present

## 2020-02-21 DIAGNOSIS — R41841 Cognitive communication deficit: Secondary | ICD-10-CM | POA: Diagnosis not present

## 2020-02-21 DIAGNOSIS — M545 Low back pain: Secondary | ICD-10-CM | POA: Diagnosis not present

## 2020-02-21 DIAGNOSIS — R768 Other specified abnormal immunological findings in serum: Secondary | ICD-10-CM | POA: Diagnosis not present

## 2020-02-21 DIAGNOSIS — M3500 Sicca syndrome, unspecified: Secondary | ICD-10-CM | POA: Diagnosis not present

## 2020-02-21 DIAGNOSIS — N951 Menopausal and female climacteric states: Secondary | ICD-10-CM | POA: Diagnosis not present

## 2020-02-21 DIAGNOSIS — K920 Hematemesis: Secondary | ICD-10-CM | POA: Diagnosis not present

## 2020-02-21 DIAGNOSIS — K22719 Barrett's esophagus with dysplasia, unspecified: Secondary | ICD-10-CM | POA: Diagnosis not present

## 2020-02-21 DIAGNOSIS — C250 Malignant neoplasm of head of pancreas: Secondary | ICD-10-CM | POA: Diagnosis not present

## 2020-02-21 DIAGNOSIS — C61 Malignant neoplasm of prostate: Secondary | ICD-10-CM | POA: Diagnosis not present

## 2020-02-21 DIAGNOSIS — C50512 Malignant neoplasm of lower-outer quadrant of left female breast: Secondary | ICD-10-CM | POA: Diagnosis not present

## 2020-02-21 DIAGNOSIS — D471 Chronic myeloproliferative disease: Secondary | ICD-10-CM | POA: Diagnosis not present

## 2020-02-21 DIAGNOSIS — G609 Hereditary and idiopathic neuropathy, unspecified: Secondary | ICD-10-CM | POA: Diagnosis not present

## 2020-02-21 DIAGNOSIS — Z9189 Other specified personal risk factors, not elsewhere classified: Secondary | ICD-10-CM | POA: Diagnosis not present

## 2020-02-21 DIAGNOSIS — N3941 Urge incontinence: Secondary | ICD-10-CM | POA: Diagnosis not present

## 2020-02-21 DIAGNOSIS — R5383 Other fatigue: Secondary | ICD-10-CM | POA: Diagnosis not present

## 2020-02-21 DIAGNOSIS — R339 Retention of urine, unspecified: Secondary | ICD-10-CM | POA: Diagnosis not present

## 2020-02-21 DIAGNOSIS — I255 Ischemic cardiomyopathy: Secondary | ICD-10-CM | POA: Diagnosis not present

## 2020-02-21 DIAGNOSIS — R110 Nausea: Secondary | ICD-10-CM | POA: Diagnosis not present

## 2020-02-21 DIAGNOSIS — Z9181 History of falling: Secondary | ICD-10-CM | POA: Diagnosis not present

## 2020-02-21 DIAGNOSIS — H53462 Homonymous bilateral field defects, left side: Secondary | ICD-10-CM | POA: Diagnosis not present

## 2020-02-21 DIAGNOSIS — I5022 Chronic systolic (congestive) heart failure: Secondary | ICD-10-CM | POA: Diagnosis not present

## 2020-02-21 DIAGNOSIS — M26629 Arthralgia of temporomandibular joint, unspecified side: Secondary | ICD-10-CM | POA: Diagnosis not present

## 2020-02-21 DIAGNOSIS — Z951 Presence of aortocoronary bypass graft: Secondary | ICD-10-CM | POA: Diagnosis not present

## 2020-02-21 DIAGNOSIS — S90861A Insect bite (nonvenomous), right foot, initial encounter: Secondary | ICD-10-CM | POA: Diagnosis not present

## 2020-02-21 DIAGNOSIS — I359 Nonrheumatic aortic valve disorder, unspecified: Secondary | ICD-10-CM | POA: Diagnosis not present

## 2020-02-21 DIAGNOSIS — E209 Hypoparathyroidism, unspecified: Secondary | ICD-10-CM | POA: Diagnosis not present

## 2020-02-21 DIAGNOSIS — M899 Disorder of bone, unspecified: Secondary | ICD-10-CM | POA: Diagnosis not present

## 2020-02-21 DIAGNOSIS — F324 Major depressive disorder, single episode, in partial remission: Secondary | ICD-10-CM | POA: Diagnosis not present

## 2020-02-21 DIAGNOSIS — C50211 Malignant neoplasm of upper-inner quadrant of right female breast: Secondary | ICD-10-CM | POA: Diagnosis not present

## 2020-02-21 DIAGNOSIS — B372 Candidiasis of skin and nail: Secondary | ICD-10-CM | POA: Diagnosis not present

## 2020-02-21 DIAGNOSIS — S41111A Laceration without foreign body of right upper arm, initial encounter: Secondary | ICD-10-CM | POA: Diagnosis not present

## 2020-02-21 DIAGNOSIS — R600 Localized edema: Secondary | ICD-10-CM | POA: Diagnosis not present

## 2020-02-21 DIAGNOSIS — N907 Vulvar cyst: Secondary | ICD-10-CM | POA: Diagnosis not present

## 2020-02-21 DIAGNOSIS — Z6831 Body mass index (BMI) 31.0-31.9, adult: Secondary | ICD-10-CM | POA: Diagnosis not present

## 2020-02-21 DIAGNOSIS — K3189 Other diseases of stomach and duodenum: Secondary | ICD-10-CM | POA: Diagnosis not present

## 2020-02-21 DIAGNOSIS — Z8669 Personal history of other diseases of the nervous system and sense organs: Secondary | ICD-10-CM | POA: Diagnosis not present

## 2020-02-21 DIAGNOSIS — I713 Abdominal aortic aneurysm, ruptured: Secondary | ICD-10-CM | POA: Diagnosis not present

## 2020-02-21 DIAGNOSIS — S14102S Unspecified injury at C2 level of cervical spinal cord, sequela: Secondary | ICD-10-CM | POA: Diagnosis not present

## 2020-02-21 DIAGNOSIS — M79602 Pain in left arm: Secondary | ICD-10-CM | POA: Diagnosis not present

## 2020-02-21 DIAGNOSIS — I082 Rheumatic disorders of both aortic and tricuspid valves: Secondary | ICD-10-CM | POA: Diagnosis not present

## 2020-02-21 DIAGNOSIS — I87313 Chronic venous hypertension (idiopathic) with ulcer of bilateral lower extremity: Secondary | ICD-10-CM | POA: Diagnosis not present

## 2020-02-21 DIAGNOSIS — T63461D Toxic effect of venom of wasps, accidental (unintentional), subsequent encounter: Secondary | ICD-10-CM | POA: Diagnosis not present

## 2020-02-21 DIAGNOSIS — K5730 Diverticulosis of large intestine without perforation or abscess without bleeding: Secondary | ICD-10-CM | POA: Diagnosis not present

## 2020-02-21 DIAGNOSIS — H34812 Central retinal vein occlusion, left eye, with macular edema: Secondary | ICD-10-CM | POA: Diagnosis not present

## 2020-02-21 DIAGNOSIS — R0602 Shortness of breath: Secondary | ICD-10-CM | POA: Diagnosis not present

## 2020-02-21 DIAGNOSIS — F1021 Alcohol dependence, in remission: Secondary | ICD-10-CM | POA: Diagnosis not present

## 2020-02-21 DIAGNOSIS — C50212 Malignant neoplasm of upper-inner quadrant of left female breast: Secondary | ICD-10-CM | POA: Diagnosis not present

## 2020-02-21 DIAGNOSIS — G47419 Narcolepsy without cataplexy: Secondary | ICD-10-CM | POA: Diagnosis not present

## 2020-02-21 DIAGNOSIS — R338 Other retention of urine: Secondary | ICD-10-CM | POA: Diagnosis not present

## 2020-02-21 DIAGNOSIS — M4327 Fusion of spine, lumbosacral region: Secondary | ICD-10-CM | POA: Diagnosis not present

## 2020-02-21 DIAGNOSIS — G4730 Sleep apnea, unspecified: Secondary | ICD-10-CM | POA: Diagnosis not present

## 2020-02-21 DIAGNOSIS — M5116 Intervertebral disc disorders with radiculopathy, lumbar region: Secondary | ICD-10-CM | POA: Diagnosis not present

## 2020-02-21 DIAGNOSIS — D630 Anemia in neoplastic disease: Secondary | ICD-10-CM | POA: Diagnosis not present

## 2020-02-21 DIAGNOSIS — R509 Fever, unspecified: Secondary | ICD-10-CM | POA: Diagnosis not present

## 2020-02-21 DIAGNOSIS — C773 Secondary and unspecified malignant neoplasm of axilla and upper limb lymph nodes: Secondary | ICD-10-CM | POA: Diagnosis not present

## 2020-02-21 DIAGNOSIS — R194 Change in bowel habit: Secondary | ICD-10-CM | POA: Diagnosis not present

## 2020-02-21 DIAGNOSIS — R7989 Other specified abnormal findings of blood chemistry: Secondary | ICD-10-CM | POA: Diagnosis not present

## 2020-02-21 DIAGNOSIS — Z1321 Encounter for screening for nutritional disorder: Secondary | ICD-10-CM | POA: Diagnosis not present

## 2020-02-21 DIAGNOSIS — Z9841 Cataract extraction status, right eye: Secondary | ICD-10-CM | POA: Diagnosis not present

## 2020-02-21 DIAGNOSIS — I70213 Atherosclerosis of native arteries of extremities with intermittent claudication, bilateral legs: Secondary | ICD-10-CM | POA: Diagnosis not present

## 2020-02-21 DIAGNOSIS — Z8673 Personal history of transient ischemic attack (TIA), and cerebral infarction without residual deficits: Secondary | ICD-10-CM | POA: Diagnosis not present

## 2020-02-21 DIAGNOSIS — J9622 Acute and chronic respiratory failure with hypercapnia: Secondary | ICD-10-CM | POA: Diagnosis not present

## 2020-02-21 DIAGNOSIS — M62830 Muscle spasm of back: Secondary | ICD-10-CM | POA: Diagnosis not present

## 2020-02-21 DIAGNOSIS — H524 Presbyopia: Secondary | ICD-10-CM | POA: Diagnosis not present

## 2020-02-21 DIAGNOSIS — R58 Hemorrhage, not elsewhere classified: Secondary | ICD-10-CM | POA: Diagnosis not present

## 2020-02-21 DIAGNOSIS — R935 Abnormal findings on diagnostic imaging of other abdominal regions, including retroperitoneum: Secondary | ICD-10-CM | POA: Diagnosis not present

## 2020-02-21 DIAGNOSIS — Z01812 Encounter for preprocedural laboratory examination: Secondary | ICD-10-CM | POA: Diagnosis not present

## 2020-02-21 DIAGNOSIS — Z66 Do not resuscitate: Secondary | ICD-10-CM | POA: Diagnosis not present

## 2020-02-21 DIAGNOSIS — K8689 Other specified diseases of pancreas: Secondary | ICD-10-CM | POA: Diagnosis not present

## 2020-02-21 DIAGNOSIS — N189 Chronic kidney disease, unspecified: Secondary | ICD-10-CM | POA: Diagnosis not present

## 2020-02-21 DIAGNOSIS — C7931 Secondary malignant neoplasm of brain: Secondary | ICD-10-CM | POA: Diagnosis not present

## 2020-02-21 DIAGNOSIS — I4891 Unspecified atrial fibrillation: Secondary | ICD-10-CM | POA: Diagnosis not present

## 2020-02-21 DIAGNOSIS — F320 Major depressive disorder, single episode, mild: Secondary | ICD-10-CM | POA: Diagnosis not present

## 2020-02-21 DIAGNOSIS — B029 Zoster without complications: Secondary | ICD-10-CM | POA: Diagnosis not present

## 2020-02-21 DIAGNOSIS — F325 Major depressive disorder, single episode, in full remission: Secondary | ICD-10-CM | POA: Diagnosis not present

## 2020-02-21 DIAGNOSIS — E11319 Type 2 diabetes mellitus with unspecified diabetic retinopathy without macular edema: Secondary | ICD-10-CM | POA: Diagnosis not present

## 2020-02-21 DIAGNOSIS — M79604 Pain in right leg: Secondary | ICD-10-CM | POA: Diagnosis not present

## 2020-02-21 DIAGNOSIS — L89329 Pressure ulcer of left buttock, unspecified stage: Secondary | ICD-10-CM | POA: Diagnosis not present

## 2020-02-21 DIAGNOSIS — I420 Dilated cardiomyopathy: Secondary | ICD-10-CM | POA: Diagnosis not present

## 2020-02-21 DIAGNOSIS — I495 Sick sinus syndrome: Secondary | ICD-10-CM | POA: Diagnosis not present

## 2020-02-21 DIAGNOSIS — H00015 Hordeolum externum left lower eyelid: Secondary | ICD-10-CM | POA: Diagnosis not present

## 2020-02-21 DIAGNOSIS — S42252A Displaced fracture of greater tuberosity of left humerus, initial encounter for closed fracture: Secondary | ICD-10-CM | POA: Diagnosis not present

## 2020-02-21 DIAGNOSIS — R0683 Snoring: Secondary | ICD-10-CM | POA: Diagnosis not present

## 2020-02-21 DIAGNOSIS — T465X6A Underdosing of other antihypertensive drugs, initial encounter: Secondary | ICD-10-CM | POA: Diagnosis not present

## 2020-02-21 DIAGNOSIS — C50912 Malignant neoplasm of unspecified site of left female breast: Secondary | ICD-10-CM | POA: Diagnosis not present

## 2020-02-21 DIAGNOSIS — H4089 Other specified glaucoma: Secondary | ICD-10-CM | POA: Diagnosis not present

## 2020-02-21 DIAGNOSIS — S56512A Strain of other extensor muscle, fascia and tendon at forearm level, left arm, initial encounter: Secondary | ICD-10-CM | POA: Diagnosis not present

## 2020-02-21 DIAGNOSIS — M530 Cervicocranial syndrome: Secondary | ICD-10-CM | POA: Diagnosis not present

## 2020-02-21 DIAGNOSIS — H26493 Other secondary cataract, bilateral: Secondary | ICD-10-CM | POA: Diagnosis not present

## 2020-02-21 DIAGNOSIS — H409 Unspecified glaucoma: Secondary | ICD-10-CM | POA: Diagnosis not present

## 2020-02-21 DIAGNOSIS — D122 Benign neoplasm of ascending colon: Secondary | ICD-10-CM | POA: Diagnosis not present

## 2020-02-21 DIAGNOSIS — R197 Diarrhea, unspecified: Secondary | ICD-10-CM | POA: Diagnosis not present

## 2020-02-21 DIAGNOSIS — N400 Enlarged prostate without lower urinary tract symptoms: Secondary | ICD-10-CM | POA: Diagnosis not present

## 2020-02-21 DIAGNOSIS — C3431 Malignant neoplasm of lower lobe, right bronchus or lung: Secondary | ICD-10-CM | POA: Diagnosis not present

## 2020-02-21 DIAGNOSIS — T45515A Adverse effect of anticoagulants, initial encounter: Secondary | ICD-10-CM | POA: Diagnosis not present

## 2020-02-21 DIAGNOSIS — D72819 Decreased white blood cell count, unspecified: Secondary | ICD-10-CM | POA: Diagnosis not present

## 2020-02-21 DIAGNOSIS — D120 Benign neoplasm of cecum: Secondary | ICD-10-CM | POA: Diagnosis not present

## 2020-02-21 DIAGNOSIS — R945 Abnormal results of liver function studies: Secondary | ICD-10-CM | POA: Diagnosis not present

## 2020-02-21 DIAGNOSIS — D72829 Elevated white blood cell count, unspecified: Secondary | ICD-10-CM | POA: Diagnosis not present

## 2020-02-21 DIAGNOSIS — E1136 Type 2 diabetes mellitus with diabetic cataract: Secondary | ICD-10-CM | POA: Diagnosis not present

## 2020-02-21 DIAGNOSIS — R599 Enlarged lymph nodes, unspecified: Secondary | ICD-10-CM | POA: Diagnosis not present

## 2020-02-21 DIAGNOSIS — R29818 Other symptoms and signs involving the nervous system: Secondary | ICD-10-CM | POA: Diagnosis not present

## 2020-02-21 DIAGNOSIS — Z803 Family history of malignant neoplasm of breast: Secondary | ICD-10-CM | POA: Diagnosis not present

## 2020-02-21 DIAGNOSIS — Z9989 Dependence on other enabling machines and devices: Secondary | ICD-10-CM | POA: Diagnosis not present

## 2020-02-21 DIAGNOSIS — N132 Hydronephrosis with renal and ureteral calculous obstruction: Secondary | ICD-10-CM | POA: Diagnosis not present

## 2020-02-21 DIAGNOSIS — R1319 Other dysphagia: Secondary | ICD-10-CM | POA: Diagnosis not present

## 2020-02-21 DIAGNOSIS — H00016 Hordeolum externum left eye, unspecified eyelid: Secondary | ICD-10-CM | POA: Diagnosis not present

## 2020-02-21 DIAGNOSIS — R161 Splenomegaly, not elsewhere classified: Secondary | ICD-10-CM | POA: Diagnosis not present

## 2020-02-21 DIAGNOSIS — Z7902 Long term (current) use of antithrombotics/antiplatelets: Secondary | ICD-10-CM | POA: Diagnosis not present

## 2020-02-21 DIAGNOSIS — H35722 Serous detachment of retinal pigment epithelium, left eye: Secondary | ICD-10-CM | POA: Diagnosis not present

## 2020-02-21 DIAGNOSIS — J4540 Moderate persistent asthma, uncomplicated: Secondary | ICD-10-CM | POA: Diagnosis not present

## 2020-02-21 DIAGNOSIS — I25119 Atherosclerotic heart disease of native coronary artery with unspecified angina pectoris: Secondary | ICD-10-CM | POA: Diagnosis not present

## 2020-02-21 DIAGNOSIS — M329 Systemic lupus erythematosus, unspecified: Secondary | ICD-10-CM | POA: Diagnosis not present

## 2020-02-21 DIAGNOSIS — S42291A Other displaced fracture of upper end of right humerus, initial encounter for closed fracture: Secondary | ICD-10-CM | POA: Diagnosis not present

## 2020-02-21 DIAGNOSIS — N3021 Other chronic cystitis with hematuria: Secondary | ICD-10-CM | POA: Diagnosis not present

## 2020-02-21 DIAGNOSIS — H903 Sensorineural hearing loss, bilateral: Secondary | ICD-10-CM | POA: Diagnosis not present

## 2020-02-21 DIAGNOSIS — M25761 Osteophyte, right knee: Secondary | ICD-10-CM | POA: Diagnosis not present

## 2020-02-21 DIAGNOSIS — L570 Actinic keratosis: Secondary | ICD-10-CM | POA: Diagnosis not present

## 2020-02-21 DIAGNOSIS — L985 Mucinosis of the skin: Secondary | ICD-10-CM | POA: Diagnosis not present

## 2020-02-21 DIAGNOSIS — Z79899 Other long term (current) drug therapy: Secondary | ICD-10-CM | POA: Diagnosis not present

## 2020-02-21 DIAGNOSIS — F0390 Unspecified dementia without behavioral disturbance: Secondary | ICD-10-CM | POA: Diagnosis not present

## 2020-02-21 DIAGNOSIS — M4716 Other spondylosis with myelopathy, lumbar region: Secondary | ICD-10-CM | POA: Diagnosis not present

## 2020-02-21 DIAGNOSIS — E611 Iron deficiency: Secondary | ICD-10-CM | POA: Diagnosis not present

## 2020-02-21 DIAGNOSIS — C9142 Hairy cell leukemia, in relapse: Secondary | ICD-10-CM | POA: Diagnosis not present

## 2020-02-21 DIAGNOSIS — L03115 Cellulitis of right lower limb: Secondary | ICD-10-CM | POA: Diagnosis not present

## 2020-02-21 DIAGNOSIS — E271 Primary adrenocortical insufficiency: Secondary | ICD-10-CM | POA: Diagnosis not present

## 2020-02-21 DIAGNOSIS — K259 Gastric ulcer, unspecified as acute or chronic, without hemorrhage or perforation: Secondary | ICD-10-CM | POA: Diagnosis not present

## 2020-02-21 DIAGNOSIS — M75122 Complete rotator cuff tear or rupture of left shoulder, not specified as traumatic: Secondary | ICD-10-CM | POA: Diagnosis not present

## 2020-02-21 DIAGNOSIS — I7300 Raynaud's syndrome without gangrene: Secondary | ICD-10-CM | POA: Diagnosis not present

## 2020-02-21 DIAGNOSIS — S81802A Unspecified open wound, left lower leg, initial encounter: Secondary | ICD-10-CM | POA: Diagnosis not present

## 2020-02-21 DIAGNOSIS — I4949 Other premature depolarization: Secondary | ICD-10-CM | POA: Diagnosis not present

## 2020-02-21 DIAGNOSIS — H47293 Other optic atrophy, bilateral: Secondary | ICD-10-CM | POA: Diagnosis not present

## 2020-02-21 DIAGNOSIS — K31811 Angiodysplasia of stomach and duodenum with bleeding: Secondary | ICD-10-CM | POA: Diagnosis not present

## 2020-02-21 DIAGNOSIS — S52572D Other intraarticular fracture of lower end of left radius, subsequent encounter for closed fracture with routine healing: Secondary | ICD-10-CM | POA: Diagnosis not present

## 2020-02-21 DIAGNOSIS — I441 Atrioventricular block, second degree: Secondary | ICD-10-CM | POA: Diagnosis not present

## 2020-02-21 DIAGNOSIS — M222X2 Patellofemoral disorders, left knee: Secondary | ICD-10-CM | POA: Diagnosis not present

## 2020-02-21 DIAGNOSIS — G43909 Migraine, unspecified, not intractable, without status migrainosus: Secondary | ICD-10-CM | POA: Diagnosis not present

## 2020-02-21 DIAGNOSIS — R6889 Other general symptoms and signs: Secondary | ICD-10-CM | POA: Diagnosis not present

## 2020-02-21 DIAGNOSIS — R634 Abnormal weight loss: Secondary | ICD-10-CM | POA: Diagnosis not present

## 2020-02-21 DIAGNOSIS — I6523 Occlusion and stenosis of bilateral carotid arteries: Secondary | ICD-10-CM | POA: Diagnosis not present

## 2020-02-21 DIAGNOSIS — Z79891 Long term (current) use of opiate analgesic: Secondary | ICD-10-CM | POA: Diagnosis not present

## 2020-02-21 DIAGNOSIS — Z8262 Family history of osteoporosis: Secondary | ICD-10-CM | POA: Diagnosis not present

## 2020-02-21 DIAGNOSIS — H40013 Open angle with borderline findings, low risk, bilateral: Secondary | ICD-10-CM | POA: Diagnosis not present

## 2020-02-21 DIAGNOSIS — M4310 Spondylolisthesis, site unspecified: Secondary | ICD-10-CM | POA: Diagnosis not present

## 2020-02-21 DIAGNOSIS — M0589 Other rheumatoid arthritis with rheumatoid factor of multiple sites: Secondary | ICD-10-CM | POA: Diagnosis not present

## 2020-02-21 DIAGNOSIS — J45909 Unspecified asthma, uncomplicated: Secondary | ICD-10-CM | POA: Diagnosis not present

## 2020-02-21 DIAGNOSIS — Z6838 Body mass index (BMI) 38.0-38.9, adult: Secondary | ICD-10-CM | POA: Diagnosis not present

## 2020-02-21 DIAGNOSIS — H40003 Preglaucoma, unspecified, bilateral: Secondary | ICD-10-CM | POA: Diagnosis not present

## 2020-02-21 DIAGNOSIS — R7303 Prediabetes: Secondary | ICD-10-CM | POA: Diagnosis not present

## 2020-02-21 DIAGNOSIS — M353 Polymyalgia rheumatica: Secondary | ICD-10-CM | POA: Diagnosis not present

## 2020-02-21 DIAGNOSIS — K7469 Other cirrhosis of liver: Secondary | ICD-10-CM | POA: Diagnosis not present

## 2020-02-21 DIAGNOSIS — Z9981 Dependence on supplemental oxygen: Secondary | ICD-10-CM | POA: Diagnosis not present

## 2020-02-21 DIAGNOSIS — J4520 Mild intermittent asthma, uncomplicated: Secondary | ICD-10-CM | POA: Diagnosis not present

## 2020-02-21 DIAGNOSIS — C91Z0 Other lymphoid leukemia not having achieved remission: Secondary | ICD-10-CM | POA: Diagnosis not present

## 2020-02-21 DIAGNOSIS — F329 Major depressive disorder, single episode, unspecified: Secondary | ICD-10-CM | POA: Diagnosis not present

## 2020-02-21 DIAGNOSIS — H9192 Unspecified hearing loss, left ear: Secondary | ICD-10-CM | POA: Diagnosis not present

## 2020-02-21 DIAGNOSIS — E7849 Other hyperlipidemia: Secondary | ICD-10-CM | POA: Diagnosis not present

## 2020-02-21 DIAGNOSIS — F4323 Adjustment disorder with mixed anxiety and depressed mood: Secondary | ICD-10-CM | POA: Diagnosis not present

## 2020-02-21 DIAGNOSIS — M5030 Other cervical disc degeneration, unspecified cervical region: Secondary | ICD-10-CM | POA: Diagnosis not present

## 2020-02-21 DIAGNOSIS — J9621 Acute and chronic respiratory failure with hypoxia: Secondary | ICD-10-CM | POA: Diagnosis not present

## 2020-02-21 DIAGNOSIS — Z933 Colostomy status: Secondary | ICD-10-CM | POA: Diagnosis not present

## 2020-02-21 DIAGNOSIS — H353122 Nonexudative age-related macular degeneration, left eye, intermediate dry stage: Secondary | ICD-10-CM | POA: Diagnosis not present

## 2020-02-21 DIAGNOSIS — N3001 Acute cystitis with hematuria: Secondary | ICD-10-CM | POA: Diagnosis not present

## 2020-02-21 DIAGNOSIS — N403 Nodular prostate with lower urinary tract symptoms: Secondary | ICD-10-CM | POA: Diagnosis not present

## 2020-02-21 DIAGNOSIS — M109 Gout, unspecified: Secondary | ICD-10-CM | POA: Diagnosis not present

## 2020-02-21 DIAGNOSIS — Z136 Encounter for screening for cardiovascular disorders: Secondary | ICD-10-CM | POA: Diagnosis not present

## 2020-02-21 DIAGNOSIS — F411 Generalized anxiety disorder: Secondary | ICD-10-CM | POA: Diagnosis not present

## 2020-02-21 DIAGNOSIS — G43109 Migraine with aura, not intractable, without status migrainosus: Secondary | ICD-10-CM | POA: Diagnosis not present

## 2020-02-21 DIAGNOSIS — Z953 Presence of xenogenic heart valve: Secondary | ICD-10-CM | POA: Diagnosis not present

## 2020-02-21 DIAGNOSIS — G43011 Migraine without aura, intractable, with status migrainosus: Secondary | ICD-10-CM | POA: Diagnosis not present

## 2020-02-21 DIAGNOSIS — M19049 Primary osteoarthritis, unspecified hand: Secondary | ICD-10-CM | POA: Diagnosis not present

## 2020-02-21 DIAGNOSIS — N3946 Mixed incontinence: Secondary | ICD-10-CM | POA: Diagnosis not present

## 2020-02-21 DIAGNOSIS — K2270 Barrett's esophagus without dysplasia: Secondary | ICD-10-CM | POA: Diagnosis not present

## 2020-02-21 DIAGNOSIS — Z8619 Personal history of other infectious and parasitic diseases: Secondary | ICD-10-CM | POA: Diagnosis not present

## 2020-02-21 DIAGNOSIS — M659 Synovitis and tenosynovitis, unspecified: Secondary | ICD-10-CM | POA: Diagnosis not present

## 2020-02-21 DIAGNOSIS — H353112 Nonexudative age-related macular degeneration, right eye, intermediate dry stage: Secondary | ICD-10-CM | POA: Diagnosis not present

## 2020-02-21 DIAGNOSIS — M25762 Osteophyte, left knee: Secondary | ICD-10-CM | POA: Diagnosis not present

## 2020-02-21 DIAGNOSIS — E113411 Type 2 diabetes mellitus with severe nonproliferative diabetic retinopathy with macular edema, right eye: Secondary | ICD-10-CM | POA: Diagnosis not present

## 2020-02-21 DIAGNOSIS — Z8701 Personal history of pneumonia (recurrent): Secondary | ICD-10-CM | POA: Diagnosis not present

## 2020-02-21 DIAGNOSIS — F0151 Vascular dementia with behavioral disturbance: Secondary | ICD-10-CM | POA: Diagnosis not present

## 2020-02-21 DIAGNOSIS — Z885 Allergy status to narcotic agent status: Secondary | ICD-10-CM | POA: Diagnosis not present

## 2020-02-21 DIAGNOSIS — F319 Bipolar disorder, unspecified: Secondary | ICD-10-CM | POA: Diagnosis not present

## 2020-02-21 DIAGNOSIS — L719 Rosacea, unspecified: Secondary | ICD-10-CM | POA: Diagnosis not present

## 2020-02-21 DIAGNOSIS — R1084 Generalized abdominal pain: Secondary | ICD-10-CM | POA: Diagnosis not present

## 2020-02-21 DIAGNOSIS — C9210 Chronic myeloid leukemia, BCR/ABL-positive, not having achieved remission: Secondary | ICD-10-CM | POA: Diagnosis not present

## 2020-02-21 DIAGNOSIS — I5032 Chronic diastolic (congestive) heart failure: Secondary | ICD-10-CM | POA: Diagnosis not present

## 2020-02-21 DIAGNOSIS — I5021 Acute systolic (congestive) heart failure: Secondary | ICD-10-CM | POA: Diagnosis not present

## 2020-02-21 DIAGNOSIS — S40862A Insect bite (nonvenomous) of left upper arm, initial encounter: Secondary | ICD-10-CM | POA: Diagnosis not present

## 2020-02-21 DIAGNOSIS — N760 Acute vaginitis: Secondary | ICD-10-CM | POA: Diagnosis not present

## 2020-02-21 DIAGNOSIS — S4992XA Unspecified injury of left shoulder and upper arm, initial encounter: Secondary | ICD-10-CM | POA: Diagnosis not present

## 2020-02-21 DIAGNOSIS — N50812 Left testicular pain: Secondary | ICD-10-CM | POA: Diagnosis not present

## 2020-02-21 DIAGNOSIS — K2980 Duodenitis without bleeding: Secondary | ICD-10-CM | POA: Diagnosis not present

## 2020-02-21 DIAGNOSIS — M5412 Radiculopathy, cervical region: Secondary | ICD-10-CM | POA: Diagnosis not present

## 2020-02-21 DIAGNOSIS — M75111 Incomplete rotator cuff tear or rupture of right shoulder, not specified as traumatic: Secondary | ICD-10-CM | POA: Diagnosis not present

## 2020-02-21 DIAGNOSIS — H4321 Crystalline deposits in vitreous body, right eye: Secondary | ICD-10-CM | POA: Diagnosis not present

## 2020-02-21 DIAGNOSIS — I83811 Varicose veins of right lower extremities with pain: Secondary | ICD-10-CM | POA: Diagnosis not present

## 2020-02-21 DIAGNOSIS — Z681 Body mass index (BMI) 19 or less, adult: Secondary | ICD-10-CM | POA: Diagnosis not present

## 2020-02-21 DIAGNOSIS — R292 Abnormal reflex: Secondary | ICD-10-CM | POA: Diagnosis not present

## 2020-02-21 DIAGNOSIS — H6121 Impacted cerumen, right ear: Secondary | ICD-10-CM | POA: Diagnosis not present

## 2020-02-21 DIAGNOSIS — H0102B Squamous blepharitis left eye, upper and lower eyelids: Secondary | ICD-10-CM | POA: Diagnosis not present

## 2020-02-21 DIAGNOSIS — R319 Hematuria, unspecified: Secondary | ICD-10-CM | POA: Diagnosis not present

## 2020-02-21 DIAGNOSIS — M25562 Pain in left knee: Secondary | ICD-10-CM | POA: Diagnosis not present

## 2020-02-21 DIAGNOSIS — I2721 Secondary pulmonary arterial hypertension: Secondary | ICD-10-CM | POA: Diagnosis not present

## 2020-02-21 DIAGNOSIS — J431 Panlobular emphysema: Secondary | ICD-10-CM | POA: Diagnosis not present

## 2020-02-21 DIAGNOSIS — L299 Pruritus, unspecified: Secondary | ICD-10-CM | POA: Diagnosis not present

## 2020-02-21 DIAGNOSIS — Z90722 Acquired absence of ovaries, bilateral: Secondary | ICD-10-CM | POA: Diagnosis not present

## 2020-02-21 DIAGNOSIS — S6992XA Unspecified injury of left wrist, hand and finger(s), initial encounter: Secondary | ICD-10-CM | POA: Diagnosis not present

## 2020-02-21 DIAGNOSIS — I6350 Cerebral infarction due to unspecified occlusion or stenosis of unspecified cerebral artery: Secondary | ICD-10-CM | POA: Diagnosis not present

## 2020-02-21 DIAGNOSIS — B370 Candidal stomatitis: Secondary | ICD-10-CM | POA: Diagnosis not present

## 2020-02-21 DIAGNOSIS — M79605 Pain in left leg: Secondary | ICD-10-CM | POA: Diagnosis not present

## 2020-02-21 DIAGNOSIS — G9341 Metabolic encephalopathy: Secondary | ICD-10-CM | POA: Diagnosis not present

## 2020-02-21 DIAGNOSIS — E1142 Type 2 diabetes mellitus with diabetic polyneuropathy: Secondary | ICD-10-CM | POA: Diagnosis not present

## 2020-02-21 DIAGNOSIS — M79621 Pain in right upper arm: Secondary | ICD-10-CM | POA: Diagnosis not present

## 2020-02-21 DIAGNOSIS — Z9013 Acquired absence of bilateral breasts and nipples: Secondary | ICD-10-CM | POA: Diagnosis not present

## 2020-02-21 DIAGNOSIS — Z96643 Presence of artificial hip joint, bilateral: Secondary | ICD-10-CM | POA: Diagnosis not present

## 2020-02-21 DIAGNOSIS — K589 Irritable bowel syndrome without diarrhea: Secondary | ICD-10-CM | POA: Diagnosis not present

## 2020-02-21 DIAGNOSIS — E113293 Type 2 diabetes mellitus with mild nonproliferative diabetic retinopathy without macular edema, bilateral: Secondary | ICD-10-CM | POA: Diagnosis not present

## 2020-02-21 DIAGNOSIS — K5900 Constipation, unspecified: Secondary | ICD-10-CM | POA: Diagnosis not present

## 2020-02-21 DIAGNOSIS — R519 Headache, unspecified: Secondary | ICD-10-CM | POA: Diagnosis not present

## 2020-02-21 DIAGNOSIS — D229 Melanocytic nevi, unspecified: Secondary | ICD-10-CM | POA: Diagnosis not present

## 2020-02-21 DIAGNOSIS — E113593 Type 2 diabetes mellitus with proliferative diabetic retinopathy without macular edema, bilateral: Secondary | ICD-10-CM | POA: Diagnosis not present

## 2020-02-21 DIAGNOSIS — S42002A Fracture of unspecified part of left clavicle, initial encounter for closed fracture: Secondary | ICD-10-CM | POA: Diagnosis not present

## 2020-02-21 DIAGNOSIS — M7540 Impingement syndrome of unspecified shoulder: Secondary | ICD-10-CM | POA: Diagnosis not present

## 2020-02-21 DIAGNOSIS — F3181 Bipolar II disorder: Secondary | ICD-10-CM | POA: Diagnosis not present

## 2020-02-21 DIAGNOSIS — R0689 Other abnormalities of breathing: Secondary | ICD-10-CM | POA: Diagnosis not present

## 2020-02-21 DIAGNOSIS — R05 Cough: Secondary | ICD-10-CM | POA: Diagnosis not present

## 2020-02-21 DIAGNOSIS — J410 Simple chronic bronchitis: Secondary | ICD-10-CM | POA: Diagnosis not present

## 2020-02-21 DIAGNOSIS — K529 Noninfective gastroenteritis and colitis, unspecified: Secondary | ICD-10-CM | POA: Diagnosis not present

## 2020-02-21 DIAGNOSIS — Z20828 Contact with and (suspected) exposure to other viral communicable diseases: Secondary | ICD-10-CM | POA: Diagnosis not present

## 2020-02-21 DIAGNOSIS — H1045 Other chronic allergic conjunctivitis: Secondary | ICD-10-CM | POA: Diagnosis not present

## 2020-02-21 DIAGNOSIS — N6012 Diffuse cystic mastopathy of left breast: Secondary | ICD-10-CM | POA: Diagnosis not present

## 2020-02-21 DIAGNOSIS — C4321 Malignant melanoma of right ear and external auricular canal: Secondary | ICD-10-CM | POA: Diagnosis not present

## 2020-02-21 DIAGNOSIS — T8141XA Infection following a procedure, superficial incisional surgical site, initial encounter: Secondary | ICD-10-CM | POA: Diagnosis not present

## 2020-02-21 DIAGNOSIS — K222 Esophageal obstruction: Secondary | ICD-10-CM | POA: Diagnosis not present

## 2020-02-21 DIAGNOSIS — S22000A Wedge compression fracture of unspecified thoracic vertebra, initial encounter for closed fracture: Secondary | ICD-10-CM | POA: Diagnosis not present

## 2020-02-21 DIAGNOSIS — H25811 Combined forms of age-related cataract, right eye: Secondary | ICD-10-CM | POA: Diagnosis not present

## 2020-02-21 DIAGNOSIS — I8312 Varicose veins of left lower extremity with inflammation: Secondary | ICD-10-CM | POA: Diagnosis not present

## 2020-02-21 DIAGNOSIS — M47817 Spondylosis without myelopathy or radiculopathy, lumbosacral region: Secondary | ICD-10-CM | POA: Diagnosis not present

## 2020-02-21 DIAGNOSIS — M25661 Stiffness of right knee, not elsewhere classified: Secondary | ICD-10-CM | POA: Diagnosis not present

## 2020-02-21 DIAGNOSIS — I8390 Asymptomatic varicose veins of unspecified lower extremity: Secondary | ICD-10-CM | POA: Diagnosis not present

## 2020-02-21 DIAGNOSIS — E1143 Type 2 diabetes mellitus with diabetic autonomic (poly)neuropathy: Secondary | ICD-10-CM | POA: Diagnosis not present

## 2020-02-21 DIAGNOSIS — M7741 Metatarsalgia, right foot: Secondary | ICD-10-CM | POA: Diagnosis not present

## 2020-02-21 DIAGNOSIS — D693 Immune thrombocytopenic purpura: Secondary | ICD-10-CM | POA: Diagnosis not present

## 2020-02-21 DIAGNOSIS — H53002 Unspecified amblyopia, left eye: Secondary | ICD-10-CM | POA: Diagnosis not present

## 2020-02-21 DIAGNOSIS — R208 Other disturbances of skin sensation: Secondary | ICD-10-CM | POA: Diagnosis not present

## 2020-02-21 DIAGNOSIS — Z96651 Presence of right artificial knee joint: Secondary | ICD-10-CM | POA: Diagnosis not present

## 2020-02-21 DIAGNOSIS — F4310 Post-traumatic stress disorder, unspecified: Secondary | ICD-10-CM | POA: Diagnosis not present

## 2020-02-21 DIAGNOSIS — J440 Chronic obstructive pulmonary disease with acute lower respiratory infection: Secondary | ICD-10-CM | POA: Diagnosis not present

## 2020-02-21 DIAGNOSIS — Z86011 Personal history of benign neoplasm of the brain: Secondary | ICD-10-CM | POA: Diagnosis not present

## 2020-02-21 DIAGNOSIS — Z171 Estrogen receptor negative status [ER-]: Secondary | ICD-10-CM | POA: Diagnosis not present

## 2020-02-21 DIAGNOSIS — I442 Atrioventricular block, complete: Secondary | ICD-10-CM | POA: Diagnosis not present

## 2020-02-21 DIAGNOSIS — F3130 Bipolar disorder, current episode depressed, mild or moderate severity, unspecified: Secondary | ICD-10-CM | POA: Diagnosis not present

## 2020-02-21 DIAGNOSIS — E6609 Other obesity due to excess calories: Secondary | ICD-10-CM | POA: Diagnosis not present

## 2020-02-21 DIAGNOSIS — D2271 Melanocytic nevi of right lower limb, including hip: Secondary | ICD-10-CM | POA: Diagnosis not present

## 2020-02-21 DIAGNOSIS — N1831 Chronic kidney disease, stage 3a: Secondary | ICD-10-CM | POA: Diagnosis not present

## 2020-02-21 DIAGNOSIS — H59811 Chorioretinal scars after surgery for detachment, right eye: Secondary | ICD-10-CM | POA: Diagnosis not present

## 2020-02-21 DIAGNOSIS — S72144D Nondisplaced intertrochanteric fracture of right femur, subsequent encounter for closed fracture with routine healing: Secondary | ICD-10-CM | POA: Diagnosis not present

## 2020-02-21 DIAGNOSIS — M7542 Impingement syndrome of left shoulder: Secondary | ICD-10-CM | POA: Diagnosis not present

## 2020-02-21 DIAGNOSIS — H6123 Impacted cerumen, bilateral: Secondary | ICD-10-CM | POA: Diagnosis not present

## 2020-02-21 DIAGNOSIS — I97811 Intraoperative cerebrovascular infarction during other surgery: Secondary | ICD-10-CM | POA: Diagnosis not present

## 2020-02-21 DIAGNOSIS — S2242XG Multiple fractures of ribs, left side, subsequent encounter for fracture with delayed healing: Secondary | ICD-10-CM | POA: Diagnosis not present

## 2020-02-21 DIAGNOSIS — M8589 Other specified disorders of bone density and structure, multiple sites: Secondary | ICD-10-CM | POA: Diagnosis not present

## 2020-02-21 DIAGNOSIS — S2242XA Multiple fractures of ribs, left side, initial encounter for closed fracture: Secondary | ICD-10-CM | POA: Diagnosis not present

## 2020-02-21 DIAGNOSIS — L209 Atopic dermatitis, unspecified: Secondary | ICD-10-CM | POA: Diagnosis not present

## 2020-02-21 DIAGNOSIS — M25532 Pain in left wrist: Secondary | ICD-10-CM | POA: Diagnosis not present

## 2020-02-21 DIAGNOSIS — I2510 Atherosclerotic heart disease of native coronary artery without angina pectoris: Secondary | ICD-10-CM | POA: Diagnosis not present

## 2020-02-21 DIAGNOSIS — K7030 Alcoholic cirrhosis of liver without ascites: Secondary | ICD-10-CM | POA: Diagnosis not present

## 2020-02-21 DIAGNOSIS — G309 Alzheimer's disease, unspecified: Secondary | ICD-10-CM | POA: Diagnosis not present

## 2020-02-21 DIAGNOSIS — R21 Rash and other nonspecific skin eruption: Secondary | ICD-10-CM | POA: Diagnosis not present

## 2020-02-21 DIAGNOSIS — R12 Heartburn: Secondary | ICD-10-CM | POA: Diagnosis not present

## 2020-02-21 DIAGNOSIS — N950 Postmenopausal bleeding: Secondary | ICD-10-CM | POA: Diagnosis not present

## 2020-02-21 DIAGNOSIS — M170 Bilateral primary osteoarthritis of knee: Secondary | ICD-10-CM | POA: Diagnosis not present

## 2020-02-21 DIAGNOSIS — C775 Secondary and unspecified malignant neoplasm of intrapelvic lymph nodes: Secondary | ICD-10-CM | POA: Diagnosis not present

## 2020-02-21 DIAGNOSIS — C8519 Unspecified B-cell lymphoma, extranodal and solid organ sites: Secondary | ICD-10-CM | POA: Diagnosis not present

## 2020-02-21 DIAGNOSIS — T82858A Stenosis of vascular prosthetic devices, implants and grafts, initial encounter: Secondary | ICD-10-CM | POA: Diagnosis not present

## 2020-02-21 DIAGNOSIS — G40409 Other generalized epilepsy and epileptic syndromes, not intractable, without status epilepticus: Secondary | ICD-10-CM | POA: Diagnosis not present

## 2020-02-21 DIAGNOSIS — I120 Hypertensive chronic kidney disease with stage 5 chronic kidney disease or end stage renal disease: Secondary | ICD-10-CM | POA: Diagnosis not present

## 2020-02-21 DIAGNOSIS — I8500 Esophageal varices without bleeding: Secondary | ICD-10-CM | POA: Diagnosis not present

## 2020-02-21 DIAGNOSIS — N819 Female genital prolapse, unspecified: Secondary | ICD-10-CM | POA: Diagnosis not present

## 2020-02-21 DIAGNOSIS — R262 Difficulty in walking, not elsewhere classified: Secondary | ICD-10-CM | POA: Diagnosis not present

## 2020-02-21 DIAGNOSIS — E11621 Type 2 diabetes mellitus with foot ulcer: Secondary | ICD-10-CM | POA: Diagnosis not present

## 2020-02-21 DIAGNOSIS — I6930 Unspecified sequelae of cerebral infarction: Secondary | ICD-10-CM | POA: Diagnosis not present

## 2020-02-21 DIAGNOSIS — H04123 Dry eye syndrome of bilateral lacrimal glands: Secondary | ICD-10-CM | POA: Diagnosis not present

## 2020-02-21 DIAGNOSIS — Z881 Allergy status to other antibiotic agents status: Secondary | ICD-10-CM | POA: Diagnosis not present

## 2020-02-21 DIAGNOSIS — H401132 Primary open-angle glaucoma, bilateral, moderate stage: Secondary | ICD-10-CM | POA: Diagnosis not present

## 2020-02-21 DIAGNOSIS — F322 Major depressive disorder, single episode, severe without psychotic features: Secondary | ICD-10-CM | POA: Diagnosis not present

## 2020-02-21 DIAGNOSIS — D539 Nutritional anemia, unspecified: Secondary | ICD-10-CM | POA: Diagnosis not present

## 2020-02-21 DIAGNOSIS — Z8639 Personal history of other endocrine, nutritional and metabolic disease: Secondary | ICD-10-CM | POA: Diagnosis not present

## 2020-02-21 DIAGNOSIS — H548 Legal blindness, as defined in USA: Secondary | ICD-10-CM | POA: Diagnosis not present

## 2020-02-21 DIAGNOSIS — R55 Syncope and collapse: Secondary | ICD-10-CM | POA: Diagnosis not present

## 2020-02-21 DIAGNOSIS — G603 Idiopathic progressive neuropathy: Secondary | ICD-10-CM | POA: Diagnosis not present

## 2020-02-21 DIAGNOSIS — L853 Xerosis cutis: Secondary | ICD-10-CM | POA: Diagnosis not present

## 2020-02-21 DIAGNOSIS — H43812 Vitreous degeneration, left eye: Secondary | ICD-10-CM | POA: Diagnosis not present

## 2020-02-21 DIAGNOSIS — R279 Unspecified lack of coordination: Secondary | ICD-10-CM | POA: Diagnosis not present

## 2020-02-21 DIAGNOSIS — Q66211 Congenital metatarsus primus varus, right foot: Secondary | ICD-10-CM | POA: Diagnosis not present

## 2020-02-21 DIAGNOSIS — D709 Neutropenia, unspecified: Secondary | ICD-10-CM | POA: Diagnosis not present

## 2020-02-21 DIAGNOSIS — Z9889 Other specified postprocedural states: Secondary | ICD-10-CM | POA: Diagnosis not present

## 2020-02-21 DIAGNOSIS — M79643 Pain in unspecified hand: Secondary | ICD-10-CM | POA: Diagnosis not present

## 2020-02-21 DIAGNOSIS — R29702 NIHSS score 2: Secondary | ICD-10-CM | POA: Diagnosis not present

## 2020-02-21 DIAGNOSIS — M1611 Unilateral primary osteoarthritis, right hip: Secondary | ICD-10-CM | POA: Diagnosis not present

## 2020-02-21 DIAGNOSIS — R1313 Dysphagia, pharyngeal phase: Secondary | ICD-10-CM | POA: Diagnosis not present

## 2020-02-21 DIAGNOSIS — Z993 Dependence on wheelchair: Secondary | ICD-10-CM | POA: Diagnosis not present

## 2020-02-21 DIAGNOSIS — E119 Type 2 diabetes mellitus without complications: Secondary | ICD-10-CM | POA: Diagnosis not present

## 2020-02-21 DIAGNOSIS — D225 Melanocytic nevi of trunk: Secondary | ICD-10-CM | POA: Diagnosis not present

## 2020-02-21 DIAGNOSIS — Z91048 Other nonmedicinal substance allergy status: Secondary | ICD-10-CM | POA: Diagnosis not present

## 2020-02-21 DIAGNOSIS — C44519 Basal cell carcinoma of skin of other part of trunk: Secondary | ICD-10-CM | POA: Diagnosis not present

## 2020-02-21 DIAGNOSIS — B9620 Unspecified Escherichia coli [E. coli] as the cause of diseases classified elsewhere: Secondary | ICD-10-CM | POA: Diagnosis not present

## 2020-02-21 DIAGNOSIS — M19041 Primary osteoarthritis, right hand: Secondary | ICD-10-CM | POA: Diagnosis not present

## 2020-02-21 DIAGNOSIS — I6601 Occlusion and stenosis of right middle cerebral artery: Secondary | ICD-10-CM | POA: Diagnosis not present

## 2020-02-21 DIAGNOSIS — G4763 Sleep related bruxism: Secondary | ICD-10-CM | POA: Diagnosis not present

## 2020-02-21 DIAGNOSIS — Z833 Family history of diabetes mellitus: Secondary | ICD-10-CM | POA: Diagnosis not present

## 2020-02-21 DIAGNOSIS — M12811 Other specific arthropathies, not elsewhere classified, right shoulder: Secondary | ICD-10-CM | POA: Diagnosis not present

## 2020-02-21 DIAGNOSIS — E782 Mixed hyperlipidemia: Secondary | ICD-10-CM | POA: Diagnosis not present

## 2020-02-21 DIAGNOSIS — E1152 Type 2 diabetes mellitus with diabetic peripheral angiopathy with gangrene: Secondary | ICD-10-CM | POA: Diagnosis not present

## 2020-02-21 DIAGNOSIS — Z792 Long term (current) use of antibiotics: Secondary | ICD-10-CM | POA: Diagnosis not present

## 2020-02-21 DIAGNOSIS — H16232 Neurotrophic keratoconjunctivitis, left eye: Secondary | ICD-10-CM | POA: Diagnosis not present

## 2020-02-21 DIAGNOSIS — Z6836 Body mass index (BMI) 36.0-36.9, adult: Secondary | ICD-10-CM | POA: Diagnosis not present

## 2020-02-22 DIAGNOSIS — Z9189 Other specified personal risk factors, not elsewhere classified: Secondary | ICD-10-CM | POA: Diagnosis not present

## 2020-02-22 DIAGNOSIS — I9589 Other hypotension: Secondary | ICD-10-CM | POA: Diagnosis not present

## 2020-02-22 DIAGNOSIS — M25622 Stiffness of left elbow, not elsewhere classified: Secondary | ICD-10-CM | POA: Diagnosis not present

## 2020-02-22 DIAGNOSIS — S82141D Displaced bicondylar fracture of right tibia, subsequent encounter for closed fracture with routine healing: Secondary | ICD-10-CM | POA: Diagnosis not present

## 2020-02-22 DIAGNOSIS — D66 Hereditary factor VIII deficiency: Secondary | ICD-10-CM | POA: Diagnosis not present

## 2020-02-22 DIAGNOSIS — S81812A Laceration without foreign body, left lower leg, initial encounter: Secondary | ICD-10-CM | POA: Diagnosis not present

## 2020-02-22 DIAGNOSIS — Z0001 Encounter for general adult medical examination with abnormal findings: Secondary | ICD-10-CM | POA: Diagnosis not present

## 2020-02-22 DIAGNOSIS — B078 Other viral warts: Secondary | ICD-10-CM | POA: Diagnosis not present

## 2020-02-22 DIAGNOSIS — H5462 Unqualified visual loss, left eye, normal vision right eye: Secondary | ICD-10-CM | POA: Diagnosis not present

## 2020-02-22 DIAGNOSIS — M5033 Other cervical disc degeneration, cervicothoracic region: Secondary | ICD-10-CM | POA: Diagnosis not present

## 2020-02-22 DIAGNOSIS — H02889 Meibomian gland dysfunction of unspecified eye, unspecified eyelid: Secondary | ICD-10-CM | POA: Diagnosis not present

## 2020-02-22 DIAGNOSIS — R0989 Other specified symptoms and signs involving the circulatory and respiratory systems: Secondary | ICD-10-CM | POA: Diagnosis not present

## 2020-02-22 DIAGNOSIS — L2389 Allergic contact dermatitis due to other agents: Secondary | ICD-10-CM | POA: Diagnosis not present

## 2020-02-22 DIAGNOSIS — S99921A Unspecified injury of right foot, initial encounter: Secondary | ICD-10-CM | POA: Diagnosis not present

## 2020-02-22 DIAGNOSIS — F330 Major depressive disorder, recurrent, mild: Secondary | ICD-10-CM | POA: Diagnosis not present

## 2020-02-22 DIAGNOSIS — Z7901 Long term (current) use of anticoagulants: Secondary | ICD-10-CM | POA: Diagnosis not present

## 2020-02-22 DIAGNOSIS — R188 Other ascites: Secondary | ICD-10-CM | POA: Diagnosis not present

## 2020-02-22 DIAGNOSIS — M0600 Rheumatoid arthritis without rheumatoid factor, unspecified site: Secondary | ICD-10-CM | POA: Diagnosis not present

## 2020-02-22 DIAGNOSIS — F1021 Alcohol dependence, in remission: Secondary | ICD-10-CM | POA: Diagnosis not present

## 2020-02-22 DIAGNOSIS — K5790 Diverticulosis of intestine, part unspecified, without perforation or abscess without bleeding: Secondary | ICD-10-CM | POA: Diagnosis not present

## 2020-02-22 DIAGNOSIS — Z8669 Personal history of other diseases of the nervous system and sense organs: Secondary | ICD-10-CM | POA: Diagnosis not present

## 2020-02-22 DIAGNOSIS — M1A00X0 Idiopathic chronic gout, unspecified site, without tophus (tophi): Secondary | ICD-10-CM | POA: Diagnosis not present

## 2020-02-22 DIAGNOSIS — M170 Bilateral primary osteoarthritis of knee: Secondary | ICD-10-CM | POA: Diagnosis not present

## 2020-02-22 DIAGNOSIS — S7292XD Unspecified fracture of left femur, subsequent encounter for closed fracture with routine healing: Secondary | ICD-10-CM | POA: Diagnosis not present

## 2020-02-22 DIAGNOSIS — Z5111 Encounter for antineoplastic chemotherapy: Secondary | ICD-10-CM | POA: Diagnosis not present

## 2020-02-22 DIAGNOSIS — Q208 Other congenital malformations of cardiac chambers and connections: Secondary | ICD-10-CM | POA: Diagnosis not present

## 2020-02-22 DIAGNOSIS — C320 Malignant neoplasm of glottis: Secondary | ICD-10-CM | POA: Diagnosis not present

## 2020-02-22 DIAGNOSIS — Q761 Klippel-Feil syndrome: Secondary | ICD-10-CM | POA: Diagnosis not present

## 2020-02-22 DIAGNOSIS — H524 Presbyopia: Secondary | ICD-10-CM | POA: Diagnosis not present

## 2020-02-22 DIAGNOSIS — L723 Sebaceous cyst: Secondary | ICD-10-CM | POA: Diagnosis not present

## 2020-02-22 DIAGNOSIS — M8448XD Pathological fracture, other site, subsequent encounter for fracture with routine healing: Secondary | ICD-10-CM | POA: Diagnosis not present

## 2020-02-22 DIAGNOSIS — H40023 Open angle with borderline findings, high risk, bilateral: Secondary | ICD-10-CM | POA: Diagnosis not present

## 2020-02-22 DIAGNOSIS — L2089 Other atopic dermatitis: Secondary | ICD-10-CM | POA: Diagnosis not present

## 2020-02-22 DIAGNOSIS — M353 Polymyalgia rheumatica: Secondary | ICD-10-CM | POA: Diagnosis not present

## 2020-02-22 DIAGNOSIS — L97522 Non-pressure chronic ulcer of other part of left foot with fat layer exposed: Secondary | ICD-10-CM | POA: Diagnosis not present

## 2020-02-22 DIAGNOSIS — Z7951 Long term (current) use of inhaled steroids: Secondary | ICD-10-CM | POA: Diagnosis not present

## 2020-02-22 DIAGNOSIS — M21611 Bunion of right foot: Secondary | ICD-10-CM | POA: Diagnosis not present

## 2020-02-22 DIAGNOSIS — Z953 Presence of xenogenic heart valve: Secondary | ICD-10-CM | POA: Diagnosis not present

## 2020-02-22 DIAGNOSIS — I469 Cardiac arrest, cause unspecified: Secondary | ICD-10-CM | POA: Diagnosis not present

## 2020-02-22 DIAGNOSIS — R918 Other nonspecific abnormal finding of lung field: Secondary | ICD-10-CM | POA: Diagnosis not present

## 2020-02-22 DIAGNOSIS — Z03818 Encounter for observation for suspected exposure to other biological agents ruled out: Secondary | ICD-10-CM | POA: Diagnosis not present

## 2020-02-22 DIAGNOSIS — L98491 Non-pressure chronic ulcer of skin of other sites limited to breakdown of skin: Secondary | ICD-10-CM | POA: Diagnosis not present

## 2020-02-22 DIAGNOSIS — G3184 Mild cognitive impairment, so stated: Secondary | ICD-10-CM | POA: Diagnosis not present

## 2020-02-22 DIAGNOSIS — I351 Nonrheumatic aortic (valve) insufficiency: Secondary | ICD-10-CM | POA: Diagnosis not present

## 2020-02-22 DIAGNOSIS — H16001 Unspecified corneal ulcer, right eye: Secondary | ICD-10-CM | POA: Diagnosis not present

## 2020-02-22 DIAGNOSIS — L089 Local infection of the skin and subcutaneous tissue, unspecified: Secondary | ICD-10-CM | POA: Diagnosis not present

## 2020-02-22 DIAGNOSIS — Z9489 Other transplanted organ and tissue status: Secondary | ICD-10-CM | POA: Diagnosis not present

## 2020-02-22 DIAGNOSIS — S92344D Nondisplaced fracture of fourth metatarsal bone, right foot, subsequent encounter for fracture with routine healing: Secondary | ICD-10-CM | POA: Diagnosis not present

## 2020-02-22 DIAGNOSIS — E11621 Type 2 diabetes mellitus with foot ulcer: Secondary | ICD-10-CM | POA: Diagnosis not present

## 2020-02-22 DIAGNOSIS — M19011 Primary osteoarthritis, right shoulder: Secondary | ICD-10-CM | POA: Diagnosis not present

## 2020-02-22 DIAGNOSIS — Z95810 Presence of automatic (implantable) cardiac defibrillator: Secondary | ICD-10-CM | POA: Diagnosis not present

## 2020-02-22 DIAGNOSIS — A048 Other specified bacterial intestinal infections: Secondary | ICD-10-CM | POA: Diagnosis not present

## 2020-02-22 DIAGNOSIS — H9203 Otalgia, bilateral: Secondary | ICD-10-CM | POA: Diagnosis not present

## 2020-02-22 DIAGNOSIS — Z6830 Body mass index (BMI) 30.0-30.9, adult: Secondary | ICD-10-CM | POA: Diagnosis not present

## 2020-02-22 DIAGNOSIS — I499 Cardiac arrhythmia, unspecified: Secondary | ICD-10-CM | POA: Diagnosis not present

## 2020-02-22 DIAGNOSIS — I129 Hypertensive chronic kidney disease with stage 1 through stage 4 chronic kidney disease, or unspecified chronic kidney disease: Secondary | ICD-10-CM | POA: Diagnosis not present

## 2020-02-22 DIAGNOSIS — H25042 Posterior subcapsular polar age-related cataract, left eye: Secondary | ICD-10-CM | POA: Diagnosis not present

## 2020-02-22 DIAGNOSIS — N3041 Irradiation cystitis with hematuria: Secondary | ICD-10-CM | POA: Diagnosis not present

## 2020-02-22 DIAGNOSIS — H02834 Dermatochalasis of left upper eyelid: Secondary | ICD-10-CM | POA: Diagnosis not present

## 2020-02-22 DIAGNOSIS — S32020G Wedge compression fracture of second lumbar vertebra, subsequent encounter for fracture with delayed healing: Secondary | ICD-10-CM | POA: Diagnosis not present

## 2020-02-22 DIAGNOSIS — C44311 Basal cell carcinoma of skin of nose: Secondary | ICD-10-CM | POA: Diagnosis not present

## 2020-02-22 DIAGNOSIS — R002 Palpitations: Secondary | ICD-10-CM | POA: Diagnosis not present

## 2020-02-22 DIAGNOSIS — R457 State of emotional shock and stress, unspecified: Secondary | ICD-10-CM | POA: Diagnosis not present

## 2020-02-22 DIAGNOSIS — N5201 Erectile dysfunction due to arterial insufficiency: Secondary | ICD-10-CM | POA: Diagnosis not present

## 2020-02-22 DIAGNOSIS — R1032 Left lower quadrant pain: Secondary | ICD-10-CM | POA: Diagnosis not present

## 2020-02-22 DIAGNOSIS — M1611 Unilateral primary osteoarthritis, right hip: Secondary | ICD-10-CM | POA: Diagnosis not present

## 2020-02-22 DIAGNOSIS — T8131XD Disruption of external operation (surgical) wound, not elsewhere classified, subsequent encounter: Secondary | ICD-10-CM | POA: Diagnosis not present

## 2020-02-22 DIAGNOSIS — S42209A Unspecified fracture of upper end of unspecified humerus, initial encounter for closed fracture: Secondary | ICD-10-CM | POA: Diagnosis not present

## 2020-02-22 DIAGNOSIS — D0472 Carcinoma in situ of skin of left lower limb, including hip: Secondary | ICD-10-CM | POA: Diagnosis not present

## 2020-02-22 DIAGNOSIS — H10829 Rosacea conjunctivitis, unspecified eye: Secondary | ICD-10-CM | POA: Diagnosis not present

## 2020-02-22 DIAGNOSIS — S9032XA Contusion of left foot, initial encounter: Secondary | ICD-10-CM | POA: Diagnosis not present

## 2020-02-22 DIAGNOSIS — R110 Nausea: Secondary | ICD-10-CM | POA: Diagnosis not present

## 2020-02-22 DIAGNOSIS — F410 Panic disorder [episodic paroxysmal anxiety] without agoraphobia: Secondary | ICD-10-CM | POA: Diagnosis not present

## 2020-02-22 DIAGNOSIS — M25572 Pain in left ankle and joints of left foot: Secondary | ICD-10-CM | POA: Diagnosis not present

## 2020-02-22 DIAGNOSIS — G894 Chronic pain syndrome: Secondary | ICD-10-CM | POA: Diagnosis not present

## 2020-02-22 DIAGNOSIS — E210 Primary hyperparathyroidism: Secondary | ICD-10-CM | POA: Diagnosis not present

## 2020-02-22 DIAGNOSIS — M109 Gout, unspecified: Secondary | ICD-10-CM | POA: Diagnosis not present

## 2020-02-22 DIAGNOSIS — M25612 Stiffness of left shoulder, not elsewhere classified: Secondary | ICD-10-CM | POA: Diagnosis not present

## 2020-02-22 DIAGNOSIS — E1129 Type 2 diabetes mellitus with other diabetic kidney complication: Secondary | ICD-10-CM | POA: Diagnosis not present

## 2020-02-22 DIAGNOSIS — G479 Sleep disorder, unspecified: Secondary | ICD-10-CM | POA: Diagnosis not present

## 2020-02-22 DIAGNOSIS — E46 Unspecified protein-calorie malnutrition: Secondary | ICD-10-CM | POA: Diagnosis not present

## 2020-02-22 DIAGNOSIS — Z791 Long term (current) use of non-steroidal anti-inflammatories (NSAID): Secondary | ICD-10-CM | POA: Diagnosis not present

## 2020-02-22 DIAGNOSIS — N319 Neuromuscular dysfunction of bladder, unspecified: Secondary | ICD-10-CM | POA: Diagnosis not present

## 2020-02-22 DIAGNOSIS — I69318 Other symptoms and signs involving cognitive functions following cerebral infarction: Secondary | ICD-10-CM | POA: Diagnosis not present

## 2020-02-22 DIAGNOSIS — S93422D Sprain of deltoid ligament of left ankle, subsequent encounter: Secondary | ICD-10-CM | POA: Diagnosis not present

## 2020-02-22 DIAGNOSIS — G4701 Insomnia due to medical condition: Secondary | ICD-10-CM | POA: Diagnosis not present

## 2020-02-22 DIAGNOSIS — Z8619 Personal history of other infectious and parasitic diseases: Secondary | ICD-10-CM | POA: Diagnosis not present

## 2020-02-22 DIAGNOSIS — H2512 Age-related nuclear cataract, left eye: Secondary | ICD-10-CM | POA: Diagnosis not present

## 2020-02-22 DIAGNOSIS — Z85820 Personal history of malignant melanoma of skin: Secondary | ICD-10-CM | POA: Diagnosis not present

## 2020-02-22 DIAGNOSIS — M150 Primary generalized (osteo)arthritis: Secondary | ICD-10-CM | POA: Diagnosis not present

## 2020-02-22 DIAGNOSIS — I361 Nonrheumatic tricuspid (valve) insufficiency: Secondary | ICD-10-CM | POA: Diagnosis not present

## 2020-02-22 DIAGNOSIS — Z9889 Other specified postprocedural states: Secondary | ICD-10-CM | POA: Diagnosis not present

## 2020-02-22 DIAGNOSIS — C641 Malignant neoplasm of right kidney, except renal pelvis: Secondary | ICD-10-CM | POA: Diagnosis not present

## 2020-02-22 DIAGNOSIS — Z9049 Acquired absence of other specified parts of digestive tract: Secondary | ICD-10-CM | POA: Diagnosis not present

## 2020-02-22 DIAGNOSIS — F439 Reaction to severe stress, unspecified: Secondary | ICD-10-CM | POA: Diagnosis not present

## 2020-02-22 DIAGNOSIS — K2950 Unspecified chronic gastritis without bleeding: Secondary | ICD-10-CM | POA: Diagnosis not present

## 2020-02-22 DIAGNOSIS — G5601 Carpal tunnel syndrome, right upper limb: Secondary | ICD-10-CM | POA: Diagnosis not present

## 2020-02-22 DIAGNOSIS — M4804 Spinal stenosis, thoracic region: Secondary | ICD-10-CM | POA: Diagnosis not present

## 2020-02-22 DIAGNOSIS — H35063 Retinal vasculitis, bilateral: Secondary | ICD-10-CM | POA: Diagnosis not present

## 2020-02-22 DIAGNOSIS — E1022 Type 1 diabetes mellitus with diabetic chronic kidney disease: Secondary | ICD-10-CM | POA: Diagnosis not present

## 2020-02-22 DIAGNOSIS — J45909 Unspecified asthma, uncomplicated: Secondary | ICD-10-CM | POA: Diagnosis not present

## 2020-02-22 DIAGNOSIS — H401411 Capsular glaucoma with pseudoexfoliation of lens, right eye, mild stage: Secondary | ICD-10-CM | POA: Diagnosis not present

## 2020-02-22 DIAGNOSIS — C775 Secondary and unspecified malignant neoplasm of intrapelvic lymph nodes: Secondary | ICD-10-CM | POA: Diagnosis not present

## 2020-02-22 DIAGNOSIS — J8410 Pulmonary fibrosis, unspecified: Secondary | ICD-10-CM | POA: Diagnosis not present

## 2020-02-22 DIAGNOSIS — S82142D Displaced bicondylar fracture of left tibia, subsequent encounter for closed fracture with routine healing: Secondary | ICD-10-CM | POA: Diagnosis not present

## 2020-02-22 DIAGNOSIS — M4802 Spinal stenosis, cervical region: Secondary | ICD-10-CM | POA: Diagnosis not present

## 2020-02-22 DIAGNOSIS — R29898 Other symptoms and signs involving the musculoskeletal system: Secondary | ICD-10-CM | POA: Diagnosis not present

## 2020-02-22 DIAGNOSIS — T85192S Other mechanical complication of implanted electronic neurostimulator (electrode) of spinal cord, sequela: Secondary | ICD-10-CM | POA: Diagnosis not present

## 2020-02-22 DIAGNOSIS — I6322 Cerebral infarction due to unspecified occlusion or stenosis of basilar arteries: Secondary | ICD-10-CM | POA: Diagnosis not present

## 2020-02-22 DIAGNOSIS — J301 Allergic rhinitis due to pollen: Secondary | ICD-10-CM | POA: Diagnosis not present

## 2020-02-22 DIAGNOSIS — Z941 Heart transplant status: Secondary | ICD-10-CM | POA: Diagnosis not present

## 2020-02-22 DIAGNOSIS — D125 Benign neoplasm of sigmoid colon: Secondary | ICD-10-CM | POA: Diagnosis not present

## 2020-02-22 DIAGNOSIS — F320 Major depressive disorder, single episode, mild: Secondary | ICD-10-CM | POA: Diagnosis not present

## 2020-02-22 DIAGNOSIS — K51918 Ulcerative colitis, unspecified with other complication: Secondary | ICD-10-CM | POA: Diagnosis not present

## 2020-02-22 DIAGNOSIS — Z4781 Encounter for orthopedic aftercare following surgical amputation: Secondary | ICD-10-CM | POA: Diagnosis not present

## 2020-02-22 DIAGNOSIS — M25641 Stiffness of right hand, not elsewhere classified: Secondary | ICD-10-CM | POA: Diagnosis not present

## 2020-02-22 DIAGNOSIS — D2261 Melanocytic nevi of right upper limb, including shoulder: Secondary | ICD-10-CM | POA: Diagnosis not present

## 2020-02-22 DIAGNOSIS — L97319 Non-pressure chronic ulcer of right ankle with unspecified severity: Secondary | ICD-10-CM | POA: Diagnosis not present

## 2020-02-22 DIAGNOSIS — H40053 Ocular hypertension, bilateral: Secondary | ICD-10-CM | POA: Diagnosis not present

## 2020-02-22 DIAGNOSIS — S92344A Nondisplaced fracture of fourth metatarsal bone, right foot, initial encounter for closed fracture: Secondary | ICD-10-CM | POA: Diagnosis not present

## 2020-02-22 DIAGNOSIS — E1169 Type 2 diabetes mellitus with other specified complication: Secondary | ICD-10-CM | POA: Diagnosis not present

## 2020-02-22 DIAGNOSIS — F17201 Nicotine dependence, unspecified, in remission: Secondary | ICD-10-CM | POA: Diagnosis not present

## 2020-02-22 DIAGNOSIS — K7460 Unspecified cirrhosis of liver: Secondary | ICD-10-CM | POA: Diagnosis not present

## 2020-02-22 DIAGNOSIS — M79651 Pain in right thigh: Secondary | ICD-10-CM | POA: Diagnosis not present

## 2020-02-22 DIAGNOSIS — M5386 Other specified dorsopathies, lumbar region: Secondary | ICD-10-CM | POA: Diagnosis not present

## 2020-02-22 DIAGNOSIS — R0602 Shortness of breath: Secondary | ICD-10-CM | POA: Diagnosis not present

## 2020-02-22 DIAGNOSIS — D582 Other hemoglobinopathies: Secondary | ICD-10-CM | POA: Diagnosis not present

## 2020-02-22 DIAGNOSIS — M8949 Other hypertrophic osteoarthropathy, multiple sites: Secondary | ICD-10-CM | POA: Diagnosis not present

## 2020-02-22 DIAGNOSIS — E213 Hyperparathyroidism, unspecified: Secondary | ICD-10-CM | POA: Diagnosis not present

## 2020-02-22 DIAGNOSIS — Z950 Presence of cardiac pacemaker: Secondary | ICD-10-CM | POA: Diagnosis not present

## 2020-02-22 DIAGNOSIS — I442 Atrioventricular block, complete: Secondary | ICD-10-CM | POA: Diagnosis not present

## 2020-02-22 DIAGNOSIS — S76011D Strain of muscle, fascia and tendon of right hip, subsequent encounter: Secondary | ICD-10-CM | POA: Diagnosis not present

## 2020-02-22 DIAGNOSIS — C672 Malignant neoplasm of lateral wall of bladder: Secondary | ICD-10-CM | POA: Diagnosis not present

## 2020-02-22 DIAGNOSIS — N052 Unspecified nephritic syndrome with diffuse membranous glomerulonephritis: Secondary | ICD-10-CM | POA: Diagnosis not present

## 2020-02-22 DIAGNOSIS — M1812 Unilateral primary osteoarthritis of first carpometacarpal joint, left hand: Secondary | ICD-10-CM | POA: Diagnosis not present

## 2020-02-22 DIAGNOSIS — S8255XD Nondisplaced fracture of medial malleolus of left tibia, subsequent encounter for closed fracture with routine healing: Secondary | ICD-10-CM | POA: Diagnosis not present

## 2020-02-22 DIAGNOSIS — Z8701 Personal history of pneumonia (recurrent): Secondary | ICD-10-CM | POA: Diagnosis not present

## 2020-02-22 DIAGNOSIS — Z8679 Personal history of other diseases of the circulatory system: Secondary | ICD-10-CM | POA: Diagnosis not present

## 2020-02-22 DIAGNOSIS — S86002D Unspecified injury of left Achilles tendon, subsequent encounter: Secondary | ICD-10-CM | POA: Diagnosis not present

## 2020-02-22 DIAGNOSIS — M4322 Fusion of spine, cervical region: Secondary | ICD-10-CM | POA: Diagnosis not present

## 2020-02-22 DIAGNOSIS — Z5112 Encounter for antineoplastic immunotherapy: Secondary | ICD-10-CM | POA: Diagnosis not present

## 2020-02-22 DIAGNOSIS — E7801 Familial hypercholesterolemia: Secondary | ICD-10-CM | POA: Diagnosis not present

## 2020-02-22 DIAGNOSIS — H401121 Primary open-angle glaucoma, left eye, mild stage: Secondary | ICD-10-CM | POA: Diagnosis not present

## 2020-02-22 DIAGNOSIS — S43422A Sprain of left rotator cuff capsule, initial encounter: Secondary | ICD-10-CM | POA: Diagnosis not present

## 2020-02-22 DIAGNOSIS — N898 Other specified noninflammatory disorders of vagina: Secondary | ICD-10-CM | POA: Diagnosis not present

## 2020-02-22 DIAGNOSIS — D689 Coagulation defect, unspecified: Secondary | ICD-10-CM | POA: Diagnosis not present

## 2020-02-22 DIAGNOSIS — K22719 Barrett's esophagus with dysplasia, unspecified: Secondary | ICD-10-CM | POA: Diagnosis not present

## 2020-02-22 DIAGNOSIS — M8589 Other specified disorders of bone density and structure, multiple sites: Secondary | ICD-10-CM | POA: Diagnosis not present

## 2020-02-22 DIAGNOSIS — J479 Bronchiectasis, uncomplicated: Secondary | ICD-10-CM | POA: Diagnosis not present

## 2020-02-22 DIAGNOSIS — M5432 Sciatica, left side: Secondary | ICD-10-CM | POA: Diagnosis not present

## 2020-02-22 DIAGNOSIS — Z9689 Presence of other specified functional implants: Secondary | ICD-10-CM | POA: Diagnosis not present

## 2020-02-22 DIAGNOSIS — H401133 Primary open-angle glaucoma, bilateral, severe stage: Secondary | ICD-10-CM | POA: Diagnosis not present

## 2020-02-22 DIAGNOSIS — C3490 Malignant neoplasm of unspecified part of unspecified bronchus or lung: Secondary | ICD-10-CM | POA: Diagnosis not present

## 2020-02-22 DIAGNOSIS — R338 Other retention of urine: Secondary | ICD-10-CM | POA: Diagnosis not present

## 2020-02-22 DIAGNOSIS — H353222 Exudative age-related macular degeneration, left eye, with inactive choroidal neovascularization: Secondary | ICD-10-CM | POA: Diagnosis not present

## 2020-02-22 DIAGNOSIS — N111 Chronic obstructive pyelonephritis: Secondary | ICD-10-CM | POA: Diagnosis not present

## 2020-02-22 DIAGNOSIS — H52203 Unspecified astigmatism, bilateral: Secondary | ICD-10-CM | POA: Diagnosis not present

## 2020-02-22 DIAGNOSIS — Z820 Family history of epilepsy and other diseases of the nervous system: Secondary | ICD-10-CM | POA: Diagnosis not present

## 2020-02-22 DIAGNOSIS — R0600 Dyspnea, unspecified: Secondary | ICD-10-CM | POA: Diagnosis not present

## 2020-02-22 DIAGNOSIS — D649 Anemia, unspecified: Secondary | ICD-10-CM | POA: Diagnosis not present

## 2020-02-22 DIAGNOSIS — M47817 Spondylosis without myelopathy or radiculopathy, lumbosacral region: Secondary | ICD-10-CM | POA: Diagnosis not present

## 2020-02-22 DIAGNOSIS — S41101D Unspecified open wound of right upper arm, subsequent encounter: Secondary | ICD-10-CM | POA: Diagnosis not present

## 2020-02-22 DIAGNOSIS — M50121 Cervical disc disorder at C4-C5 level with radiculopathy: Secondary | ICD-10-CM | POA: Diagnosis not present

## 2020-02-22 DIAGNOSIS — F319 Bipolar disorder, unspecified: Secondary | ICD-10-CM | POA: Diagnosis not present

## 2020-02-22 DIAGNOSIS — I9789 Other postprocedural complications and disorders of the circulatory system, not elsewhere classified: Secondary | ICD-10-CM | POA: Diagnosis not present

## 2020-02-22 DIAGNOSIS — E890 Postprocedural hypothyroidism: Secondary | ICD-10-CM | POA: Diagnosis not present

## 2020-02-22 DIAGNOSIS — M62552 Muscle wasting and atrophy, not elsewhere classified, left thigh: Secondary | ICD-10-CM | POA: Diagnosis not present

## 2020-02-22 DIAGNOSIS — Z1231 Encounter for screening mammogram for malignant neoplasm of breast: Secondary | ICD-10-CM | POA: Diagnosis not present

## 2020-02-22 DIAGNOSIS — F17200 Nicotine dependence, unspecified, uncomplicated: Secondary | ICD-10-CM | POA: Diagnosis not present

## 2020-02-22 DIAGNOSIS — Z932 Ileostomy status: Secondary | ICD-10-CM | POA: Diagnosis not present

## 2020-02-22 DIAGNOSIS — Z96651 Presence of right artificial knee joint: Secondary | ICD-10-CM | POA: Diagnosis not present

## 2020-02-22 DIAGNOSIS — N1832 Chronic kidney disease, stage 3b: Secondary | ICD-10-CM | POA: Diagnosis not present

## 2020-02-22 DIAGNOSIS — I25118 Atherosclerotic heart disease of native coronary artery with other forms of angina pectoris: Secondary | ICD-10-CM | POA: Diagnosis not present

## 2020-02-22 DIAGNOSIS — R946 Abnormal results of thyroid function studies: Secondary | ICD-10-CM | POA: Diagnosis not present

## 2020-02-22 DIAGNOSIS — Z23 Encounter for immunization: Secondary | ICD-10-CM | POA: Diagnosis not present

## 2020-02-22 DIAGNOSIS — I252 Old myocardial infarction: Secondary | ICD-10-CM | POA: Diagnosis not present

## 2020-02-22 DIAGNOSIS — K551 Chronic vascular disorders of intestine: Secondary | ICD-10-CM | POA: Diagnosis not present

## 2020-02-22 DIAGNOSIS — Z85038 Personal history of other malignant neoplasm of large intestine: Secondary | ICD-10-CM | POA: Diagnosis not present

## 2020-02-22 DIAGNOSIS — G43009 Migraine without aura, not intractable, without status migrainosus: Secondary | ICD-10-CM | POA: Diagnosis not present

## 2020-02-22 DIAGNOSIS — R1319 Other dysphagia: Secondary | ICD-10-CM | POA: Diagnosis not present

## 2020-02-22 DIAGNOSIS — J432 Centrilobular emphysema: Secondary | ICD-10-CM | POA: Diagnosis not present

## 2020-02-22 DIAGNOSIS — Z0000 Encounter for general adult medical examination without abnormal findings: Secondary | ICD-10-CM | POA: Diagnosis not present

## 2020-02-22 DIAGNOSIS — E538 Deficiency of other specified B group vitamins: Secondary | ICD-10-CM | POA: Diagnosis not present

## 2020-02-22 DIAGNOSIS — K449 Diaphragmatic hernia without obstruction or gangrene: Secondary | ICD-10-CM | POA: Diagnosis not present

## 2020-02-22 DIAGNOSIS — Z0189 Encounter for other specified special examinations: Secondary | ICD-10-CM | POA: Diagnosis not present

## 2020-02-22 DIAGNOSIS — D1722 Benign lipomatous neoplasm of skin and subcutaneous tissue of left arm: Secondary | ICD-10-CM | POA: Diagnosis not present

## 2020-02-22 DIAGNOSIS — M47892 Other spondylosis, cervical region: Secondary | ICD-10-CM | POA: Diagnosis not present

## 2020-02-22 DIAGNOSIS — M549 Dorsalgia, unspecified: Secondary | ICD-10-CM | POA: Diagnosis not present

## 2020-02-22 DIAGNOSIS — L7211 Pilar cyst: Secondary | ICD-10-CM | POA: Diagnosis not present

## 2020-02-22 DIAGNOSIS — E11622 Type 2 diabetes mellitus with other skin ulcer: Secondary | ICD-10-CM | POA: Diagnosis not present

## 2020-02-22 DIAGNOSIS — H401131 Primary open-angle glaucoma, bilateral, mild stage: Secondary | ICD-10-CM | POA: Diagnosis not present

## 2020-02-22 DIAGNOSIS — I4819 Other persistent atrial fibrillation: Secondary | ICD-10-CM | POA: Diagnosis not present

## 2020-02-22 DIAGNOSIS — H02055 Trichiasis without entropian left lower eyelid: Secondary | ICD-10-CM | POA: Diagnosis not present

## 2020-02-22 DIAGNOSIS — D485 Neoplasm of uncertain behavior of skin: Secondary | ICD-10-CM | POA: Diagnosis not present

## 2020-02-22 DIAGNOSIS — C169 Malignant neoplasm of stomach, unspecified: Secondary | ICD-10-CM | POA: Diagnosis not present

## 2020-02-22 DIAGNOSIS — S066X0A Traumatic subarachnoid hemorrhage without loss of consciousness, initial encounter: Secondary | ICD-10-CM | POA: Diagnosis not present

## 2020-02-22 DIAGNOSIS — J438 Other emphysema: Secondary | ICD-10-CM | POA: Diagnosis not present

## 2020-02-22 DIAGNOSIS — X32XXXS Exposure to sunlight, sequela: Secondary | ICD-10-CM | POA: Diagnosis not present

## 2020-02-22 DIAGNOSIS — R310 Gross hematuria: Secondary | ICD-10-CM | POA: Diagnosis not present

## 2020-02-22 DIAGNOSIS — A809 Acute poliomyelitis, unspecified: Secondary | ICD-10-CM | POA: Diagnosis not present

## 2020-02-22 DIAGNOSIS — H6122 Impacted cerumen, left ear: Secondary | ICD-10-CM | POA: Diagnosis not present

## 2020-02-22 DIAGNOSIS — F3342 Major depressive disorder, recurrent, in full remission: Secondary | ICD-10-CM | POA: Diagnosis not present

## 2020-02-22 DIAGNOSIS — R142 Eructation: Secondary | ICD-10-CM | POA: Diagnosis not present

## 2020-02-22 DIAGNOSIS — K3184 Gastroparesis: Secondary | ICD-10-CM | POA: Diagnosis not present

## 2020-02-22 DIAGNOSIS — H10023 Other mucopurulent conjunctivitis, bilateral: Secondary | ICD-10-CM | POA: Diagnosis not present

## 2020-02-22 DIAGNOSIS — F331 Major depressive disorder, recurrent, moderate: Secondary | ICD-10-CM | POA: Diagnosis not present

## 2020-02-22 DIAGNOSIS — R32 Unspecified urinary incontinence: Secondary | ICD-10-CM | POA: Diagnosis not present

## 2020-02-22 DIAGNOSIS — E1065 Type 1 diabetes mellitus with hyperglycemia: Secondary | ICD-10-CM | POA: Diagnosis not present

## 2020-02-22 DIAGNOSIS — F900 Attention-deficit hyperactivity disorder, predominantly inattentive type: Secondary | ICD-10-CM | POA: Diagnosis not present

## 2020-02-22 DIAGNOSIS — G453 Amaurosis fugax: Secondary | ICD-10-CM | POA: Diagnosis not present

## 2020-02-22 DIAGNOSIS — E282 Polycystic ovarian syndrome: Secondary | ICD-10-CM | POA: Diagnosis not present

## 2020-02-22 DIAGNOSIS — G8929 Other chronic pain: Secondary | ICD-10-CM | POA: Diagnosis not present

## 2020-02-22 DIAGNOSIS — M6389 Disorders of muscle in diseases classified elsewhere, multiple sites: Secondary | ICD-10-CM | POA: Diagnosis not present

## 2020-02-22 DIAGNOSIS — R10814 Left lower quadrant abdominal tenderness: Secondary | ICD-10-CM | POA: Diagnosis not present

## 2020-02-22 DIAGNOSIS — K029 Dental caries, unspecified: Secondary | ICD-10-CM | POA: Diagnosis not present

## 2020-02-22 DIAGNOSIS — M129 Arthropathy, unspecified: Secondary | ICD-10-CM | POA: Diagnosis not present

## 2020-02-22 DIAGNOSIS — E440 Moderate protein-calorie malnutrition: Secondary | ICD-10-CM | POA: Diagnosis not present

## 2020-02-22 DIAGNOSIS — F3161 Bipolar disorder, current episode mixed, mild: Secondary | ICD-10-CM | POA: Diagnosis not present

## 2020-02-22 DIAGNOSIS — D0511 Intraductal carcinoma in situ of right breast: Secondary | ICD-10-CM | POA: Diagnosis not present

## 2020-02-22 DIAGNOSIS — R634 Abnormal weight loss: Secondary | ICD-10-CM | POA: Diagnosis not present

## 2020-02-22 DIAGNOSIS — G5603 Carpal tunnel syndrome, bilateral upper limbs: Secondary | ICD-10-CM | POA: Diagnosis not present

## 2020-02-22 DIAGNOSIS — I509 Heart failure, unspecified: Secondary | ICD-10-CM | POA: Diagnosis not present

## 2020-02-22 DIAGNOSIS — F4481 Dissociative identity disorder: Secondary | ICD-10-CM | POA: Diagnosis not present

## 2020-02-22 DIAGNOSIS — R1031 Right lower quadrant pain: Secondary | ICD-10-CM | POA: Diagnosis not present

## 2020-02-22 DIAGNOSIS — G5621 Lesion of ulnar nerve, right upper limb: Secondary | ICD-10-CM | POA: Diagnosis not present

## 2020-02-22 DIAGNOSIS — M069 Rheumatoid arthritis, unspecified: Secondary | ICD-10-CM | POA: Diagnosis not present

## 2020-02-22 DIAGNOSIS — Z95828 Presence of other vascular implants and grafts: Secondary | ICD-10-CM | POA: Diagnosis not present

## 2020-02-22 DIAGNOSIS — F209 Schizophrenia, unspecified: Secondary | ICD-10-CM | POA: Diagnosis not present

## 2020-02-22 DIAGNOSIS — H26499 Other secondary cataract, unspecified eye: Secondary | ICD-10-CM | POA: Diagnosis not present

## 2020-02-22 DIAGNOSIS — R3912 Poor urinary stream: Secondary | ICD-10-CM | POA: Diagnosis not present

## 2020-02-22 DIAGNOSIS — R0982 Postnasal drip: Secondary | ICD-10-CM | POA: Diagnosis not present

## 2020-02-22 DIAGNOSIS — S52592A Other fractures of lower end of left radius, initial encounter for closed fracture: Secondary | ICD-10-CM | POA: Diagnosis not present

## 2020-02-22 DIAGNOSIS — I5022 Chronic systolic (congestive) heart failure: Secondary | ICD-10-CM | POA: Diagnosis not present

## 2020-02-22 DIAGNOSIS — L603 Nail dystrophy: Secondary | ICD-10-CM | POA: Diagnosis not present

## 2020-02-22 DIAGNOSIS — I6522 Occlusion and stenosis of left carotid artery: Secondary | ICD-10-CM | POA: Diagnosis not present

## 2020-02-22 DIAGNOSIS — I420 Dilated cardiomyopathy: Secondary | ICD-10-CM | POA: Diagnosis not present

## 2020-02-22 DIAGNOSIS — G501 Atypical facial pain: Secondary | ICD-10-CM | POA: Diagnosis not present

## 2020-02-22 DIAGNOSIS — R7309 Other abnormal glucose: Secondary | ICD-10-CM | POA: Diagnosis not present

## 2020-02-22 DIAGNOSIS — K642 Third degree hemorrhoids: Secondary | ICD-10-CM | POA: Diagnosis not present

## 2020-02-22 DIAGNOSIS — D2362 Other benign neoplasm of skin of left upper limb, including shoulder: Secondary | ICD-10-CM | POA: Diagnosis not present

## 2020-02-22 DIAGNOSIS — L738 Other specified follicular disorders: Secondary | ICD-10-CM | POA: Diagnosis not present

## 2020-02-22 DIAGNOSIS — R2241 Localized swelling, mass and lump, right lower limb: Secondary | ICD-10-CM | POA: Diagnosis not present

## 2020-02-22 DIAGNOSIS — G8918 Other acute postprocedural pain: Secondary | ICD-10-CM | POA: Diagnosis not present

## 2020-02-22 DIAGNOSIS — H25812 Combined forms of age-related cataract, left eye: Secondary | ICD-10-CM | POA: Diagnosis not present

## 2020-02-22 DIAGNOSIS — M328 Other forms of systemic lupus erythematosus: Secondary | ICD-10-CM | POA: Diagnosis not present

## 2020-02-22 DIAGNOSIS — H16223 Keratoconjunctivitis sicca, not specified as Sjogren's, bilateral: Secondary | ICD-10-CM | POA: Diagnosis not present

## 2020-02-22 DIAGNOSIS — K224 Dyskinesia of esophagus: Secondary | ICD-10-CM | POA: Diagnosis not present

## 2020-02-22 DIAGNOSIS — I63132 Cerebral infarction due to embolism of left carotid artery: Secondary | ICD-10-CM | POA: Diagnosis not present

## 2020-02-22 DIAGNOSIS — K259 Gastric ulcer, unspecified as acute or chronic, without hemorrhage or perforation: Secondary | ICD-10-CM | POA: Diagnosis not present

## 2020-02-22 DIAGNOSIS — L299 Pruritus, unspecified: Secondary | ICD-10-CM | POA: Diagnosis not present

## 2020-02-22 DIAGNOSIS — J9811 Atelectasis: Secondary | ICD-10-CM | POA: Diagnosis not present

## 2020-02-22 DIAGNOSIS — R400 Somnolence: Secondary | ICD-10-CM | POA: Diagnosis not present

## 2020-02-22 DIAGNOSIS — S81801A Unspecified open wound, right lower leg, initial encounter: Secondary | ICD-10-CM | POA: Diagnosis not present

## 2020-02-22 DIAGNOSIS — I493 Ventricular premature depolarization: Secondary | ICD-10-CM | POA: Diagnosis not present

## 2020-02-22 DIAGNOSIS — K30 Functional dyspepsia: Secondary | ICD-10-CM | POA: Diagnosis not present

## 2020-02-22 DIAGNOSIS — M25522 Pain in left elbow: Secondary | ICD-10-CM | POA: Diagnosis not present

## 2020-02-22 DIAGNOSIS — L600 Ingrowing nail: Secondary | ICD-10-CM | POA: Diagnosis not present

## 2020-02-22 DIAGNOSIS — R260 Ataxic gait: Secondary | ICD-10-CM | POA: Diagnosis not present

## 2020-02-22 DIAGNOSIS — L905 Scar conditions and fibrosis of skin: Secondary | ICD-10-CM | POA: Diagnosis not present

## 2020-02-22 DIAGNOSIS — D3131 Benign neoplasm of right choroid: Secondary | ICD-10-CM | POA: Diagnosis not present

## 2020-02-22 DIAGNOSIS — N179 Acute kidney failure, unspecified: Secondary | ICD-10-CM | POA: Diagnosis not present

## 2020-02-22 DIAGNOSIS — Z9981 Dependence on supplemental oxygen: Secondary | ICD-10-CM | POA: Diagnosis not present

## 2020-02-22 DIAGNOSIS — I38 Endocarditis, valve unspecified: Secondary | ICD-10-CM | POA: Diagnosis not present

## 2020-02-22 DIAGNOSIS — C44519 Basal cell carcinoma of skin of other part of trunk: Secondary | ICD-10-CM | POA: Diagnosis not present

## 2020-02-22 DIAGNOSIS — K228 Other specified diseases of esophagus: Secondary | ICD-10-CM | POA: Diagnosis not present

## 2020-02-22 DIAGNOSIS — I77811 Abdominal aortic ectasia: Secondary | ICD-10-CM | POA: Diagnosis not present

## 2020-02-22 DIAGNOSIS — K635 Polyp of colon: Secondary | ICD-10-CM | POA: Diagnosis not present

## 2020-02-22 DIAGNOSIS — C50511 Malignant neoplasm of lower-outer quadrant of right female breast: Secondary | ICD-10-CM | POA: Diagnosis not present

## 2020-02-22 DIAGNOSIS — M8580 Other specified disorders of bone density and structure, unspecified site: Secondary | ICD-10-CM | POA: Diagnosis not present

## 2020-02-22 DIAGNOSIS — B079 Viral wart, unspecified: Secondary | ICD-10-CM | POA: Diagnosis not present

## 2020-02-22 DIAGNOSIS — M8588 Other specified disorders of bone density and structure, other site: Secondary | ICD-10-CM | POA: Diagnosis not present

## 2020-02-22 DIAGNOSIS — Z124 Encounter for screening for malignant neoplasm of cervix: Secondary | ICD-10-CM | POA: Diagnosis not present

## 2020-02-22 DIAGNOSIS — M3500 Sicca syndrome, unspecified: Secondary | ICD-10-CM | POA: Diagnosis not present

## 2020-02-22 DIAGNOSIS — M216X1 Other acquired deformities of right foot: Secondary | ICD-10-CM | POA: Diagnosis not present

## 2020-02-22 DIAGNOSIS — N3941 Urge incontinence: Secondary | ICD-10-CM | POA: Diagnosis not present

## 2020-02-22 DIAGNOSIS — R208 Other disturbances of skin sensation: Secondary | ICD-10-CM | POA: Diagnosis not present

## 2020-02-22 DIAGNOSIS — R399 Unspecified symptoms and signs involving the genitourinary system: Secondary | ICD-10-CM | POA: Diagnosis not present

## 2020-02-22 DIAGNOSIS — H52223 Regular astigmatism, bilateral: Secondary | ICD-10-CM | POA: Diagnosis not present

## 2020-02-22 DIAGNOSIS — T45515A Adverse effect of anticoagulants, initial encounter: Secondary | ICD-10-CM | POA: Diagnosis not present

## 2020-02-22 DIAGNOSIS — N186 End stage renal disease: Secondary | ICD-10-CM | POA: Diagnosis not present

## 2020-02-22 DIAGNOSIS — L814 Other melanin hyperpigmentation: Secondary | ICD-10-CM | POA: Diagnosis not present

## 2020-02-22 DIAGNOSIS — E669 Obesity, unspecified: Secondary | ICD-10-CM | POA: Diagnosis not present

## 2020-02-22 DIAGNOSIS — Z66 Do not resuscitate: Secondary | ICD-10-CM | POA: Diagnosis not present

## 2020-02-22 DIAGNOSIS — R221 Localized swelling, mass and lump, neck: Secondary | ICD-10-CM | POA: Diagnosis not present

## 2020-02-22 DIAGNOSIS — M7061 Trochanteric bursitis, right hip: Secondary | ICD-10-CM | POA: Diagnosis not present

## 2020-02-22 DIAGNOSIS — I447 Left bundle-branch block, unspecified: Secondary | ICD-10-CM | POA: Diagnosis not present

## 2020-02-22 DIAGNOSIS — M160 Bilateral primary osteoarthritis of hip: Secondary | ICD-10-CM | POA: Diagnosis not present

## 2020-02-22 DIAGNOSIS — I081 Rheumatic disorders of both mitral and tricuspid valves: Secondary | ICD-10-CM | POA: Diagnosis not present

## 2020-02-22 DIAGNOSIS — Z85828 Personal history of other malignant neoplasm of skin: Secondary | ICD-10-CM | POA: Diagnosis not present

## 2020-02-22 DIAGNOSIS — I0981 Rheumatic heart failure: Secondary | ICD-10-CM | POA: Diagnosis not present

## 2020-02-22 DIAGNOSIS — I5031 Acute diastolic (congestive) heart failure: Secondary | ICD-10-CM | POA: Diagnosis not present

## 2020-02-22 DIAGNOSIS — I639 Cerebral infarction, unspecified: Secondary | ICD-10-CM | POA: Diagnosis not present

## 2020-02-22 DIAGNOSIS — E109 Type 1 diabetes mellitus without complications: Secondary | ICD-10-CM | POA: Diagnosis not present

## 2020-02-22 DIAGNOSIS — Z713 Dietary counseling and surveillance: Secondary | ICD-10-CM | POA: Diagnosis not present

## 2020-02-22 DIAGNOSIS — J3081 Allergic rhinitis due to animal (cat) (dog) hair and dander: Secondary | ICD-10-CM | POA: Diagnosis not present

## 2020-02-22 DIAGNOSIS — E1140 Type 2 diabetes mellitus with diabetic neuropathy, unspecified: Secondary | ICD-10-CM | POA: Diagnosis not present

## 2020-02-22 DIAGNOSIS — E782 Mixed hyperlipidemia: Secondary | ICD-10-CM | POA: Diagnosis not present

## 2020-02-22 DIAGNOSIS — Z8719 Personal history of other diseases of the digestive system: Secondary | ICD-10-CM | POA: Diagnosis not present

## 2020-02-22 DIAGNOSIS — Z87891 Personal history of nicotine dependence: Secondary | ICD-10-CM | POA: Diagnosis not present

## 2020-02-22 DIAGNOSIS — M19012 Primary osteoarthritis, left shoulder: Secondary | ICD-10-CM | POA: Diagnosis not present

## 2020-02-22 DIAGNOSIS — H0102B Squamous blepharitis left eye, upper and lower eyelids: Secondary | ICD-10-CM | POA: Diagnosis not present

## 2020-02-22 DIAGNOSIS — Q87410 Marfan's syndrome with aortic dilation: Secondary | ICD-10-CM | POA: Diagnosis not present

## 2020-02-22 DIAGNOSIS — Z9181 History of falling: Secondary | ICD-10-CM | POA: Diagnosis not present

## 2020-02-22 DIAGNOSIS — C9310 Chronic myelomonocytic leukemia not having achieved remission: Secondary | ICD-10-CM | POA: Diagnosis not present

## 2020-02-22 DIAGNOSIS — K588 Other irritable bowel syndrome: Secondary | ICD-10-CM | POA: Diagnosis not present

## 2020-02-22 DIAGNOSIS — H25813 Combined forms of age-related cataract, bilateral: Secondary | ICD-10-CM | POA: Diagnosis not present

## 2020-02-22 DIAGNOSIS — K581 Irritable bowel syndrome with constipation: Secondary | ICD-10-CM | POA: Diagnosis not present

## 2020-02-22 DIAGNOSIS — K5909 Other constipation: Secondary | ICD-10-CM | POA: Diagnosis not present

## 2020-02-22 DIAGNOSIS — M79662 Pain in left lower leg: Secondary | ICD-10-CM | POA: Diagnosis not present

## 2020-02-22 DIAGNOSIS — Z7984 Long term (current) use of oral hypoglycemic drugs: Secondary | ICD-10-CM | POA: Diagnosis not present

## 2020-02-22 DIAGNOSIS — G6281 Critical illness polyneuropathy: Secondary | ICD-10-CM | POA: Diagnosis not present

## 2020-02-22 DIAGNOSIS — S32592A Other specified fracture of left pubis, initial encounter for closed fracture: Secondary | ICD-10-CM | POA: Diagnosis not present

## 2020-02-22 DIAGNOSIS — R1314 Dysphagia, pharyngoesophageal phase: Secondary | ICD-10-CM | POA: Diagnosis not present

## 2020-02-22 DIAGNOSIS — M659 Synovitis and tenosynovitis, unspecified: Secondary | ICD-10-CM | POA: Diagnosis not present

## 2020-02-22 DIAGNOSIS — Z6832 Body mass index (BMI) 32.0-32.9, adult: Secondary | ICD-10-CM | POA: Diagnosis not present

## 2020-02-22 DIAGNOSIS — N281 Cyst of kidney, acquired: Secondary | ICD-10-CM | POA: Diagnosis not present

## 2020-02-22 DIAGNOSIS — J189 Pneumonia, unspecified organism: Secondary | ICD-10-CM | POA: Diagnosis not present

## 2020-02-22 DIAGNOSIS — K2100 Gastro-esophageal reflux disease with esophagitis, without bleeding: Secondary | ICD-10-CM | POA: Diagnosis not present

## 2020-02-22 DIAGNOSIS — A4101 Sepsis due to Methicillin susceptible Staphylococcus aureus: Secondary | ICD-10-CM | POA: Diagnosis not present

## 2020-02-22 DIAGNOSIS — L710 Perioral dermatitis: Secondary | ICD-10-CM | POA: Diagnosis not present

## 2020-02-22 DIAGNOSIS — Z1152 Encounter for screening for COVID-19: Secondary | ICD-10-CM | POA: Diagnosis not present

## 2020-02-22 DIAGNOSIS — F418 Other specified anxiety disorders: Secondary | ICD-10-CM | POA: Diagnosis not present

## 2020-02-22 DIAGNOSIS — Z951 Presence of aortocoronary bypass graft: Secondary | ICD-10-CM | POA: Diagnosis not present

## 2020-02-22 DIAGNOSIS — Z803 Family history of malignant neoplasm of breast: Secondary | ICD-10-CM | POA: Diagnosis not present

## 2020-02-22 DIAGNOSIS — Z8349 Family history of other endocrine, nutritional and metabolic diseases: Secondary | ICD-10-CM | POA: Diagnosis not present

## 2020-02-22 DIAGNOSIS — R319 Hematuria, unspecified: Secondary | ICD-10-CM | POA: Diagnosis not present

## 2020-02-22 DIAGNOSIS — H5213 Myopia, bilateral: Secondary | ICD-10-CM | POA: Diagnosis not present

## 2020-02-22 DIAGNOSIS — I8311 Varicose veins of right lower extremity with inflammation: Secondary | ICD-10-CM | POA: Diagnosis not present

## 2020-02-22 DIAGNOSIS — J9611 Chronic respiratory failure with hypoxia: Secondary | ICD-10-CM | POA: Diagnosis not present

## 2020-02-22 DIAGNOSIS — N12 Tubulo-interstitial nephritis, not specified as acute or chronic: Secondary | ICD-10-CM | POA: Diagnosis not present

## 2020-02-22 DIAGNOSIS — E1121 Type 2 diabetes mellitus with diabetic nephropathy: Secondary | ICD-10-CM | POA: Diagnosis not present

## 2020-02-22 DIAGNOSIS — K52831 Collagenous colitis: Secondary | ICD-10-CM | POA: Diagnosis not present

## 2020-02-22 DIAGNOSIS — E859 Amyloidosis, unspecified: Secondary | ICD-10-CM | POA: Diagnosis not present

## 2020-02-22 DIAGNOSIS — I2510 Atherosclerotic heart disease of native coronary artery without angina pectoris: Secondary | ICD-10-CM | POA: Diagnosis not present

## 2020-02-22 DIAGNOSIS — M79644 Pain in right finger(s): Secondary | ICD-10-CM | POA: Diagnosis not present

## 2020-02-22 DIAGNOSIS — Z01419 Encounter for gynecological examination (general) (routine) without abnormal findings: Secondary | ICD-10-CM | POA: Diagnosis not present

## 2020-02-22 DIAGNOSIS — R350 Frequency of micturition: Secondary | ICD-10-CM | POA: Diagnosis not present

## 2020-02-22 DIAGNOSIS — C44329 Squamous cell carcinoma of skin of other parts of face: Secondary | ICD-10-CM | POA: Diagnosis not present

## 2020-02-22 DIAGNOSIS — H3520 Other non-diabetic proliferative retinopathy, unspecified eye: Secondary | ICD-10-CM | POA: Diagnosis not present

## 2020-02-22 DIAGNOSIS — D220 Melanocytic nevi of lip: Secondary | ICD-10-CM | POA: Diagnosis not present

## 2020-02-22 DIAGNOSIS — D2262 Melanocytic nevi of left upper limb, including shoulder: Secondary | ICD-10-CM | POA: Diagnosis not present

## 2020-02-22 DIAGNOSIS — E6609 Other obesity due to excess calories: Secondary | ICD-10-CM | POA: Diagnosis not present

## 2020-02-22 DIAGNOSIS — M4716 Other spondylosis with myelopathy, lumbar region: Secondary | ICD-10-CM | POA: Diagnosis not present

## 2020-02-22 DIAGNOSIS — M5442 Lumbago with sciatica, left side: Secondary | ICD-10-CM | POA: Diagnosis not present

## 2020-02-22 DIAGNOSIS — K754 Autoimmune hepatitis: Secondary | ICD-10-CM | POA: Diagnosis not present

## 2020-02-22 DIAGNOSIS — M25551 Pain in right hip: Secondary | ICD-10-CM | POA: Diagnosis not present

## 2020-02-22 DIAGNOSIS — Z808 Family history of malignant neoplasm of other organs or systems: Secondary | ICD-10-CM | POA: Diagnosis not present

## 2020-02-22 DIAGNOSIS — K648 Other hemorrhoids: Secondary | ICD-10-CM | POA: Diagnosis not present

## 2020-02-22 DIAGNOSIS — M62521 Muscle wasting and atrophy, not elsewhere classified, right upper arm: Secondary | ICD-10-CM | POA: Diagnosis not present

## 2020-02-22 DIAGNOSIS — S30851A Superficial foreign body of abdominal wall, initial encounter: Secondary | ICD-10-CM | POA: Diagnosis not present

## 2020-02-22 DIAGNOSIS — Z9849 Cataract extraction status, unspecified eye: Secondary | ICD-10-CM | POA: Diagnosis not present

## 2020-02-22 DIAGNOSIS — G5602 Carpal tunnel syndrome, left upper limb: Secondary | ICD-10-CM | POA: Diagnosis not present

## 2020-02-22 DIAGNOSIS — K589 Irritable bowel syndrome without diarrhea: Secondary | ICD-10-CM | POA: Diagnosis not present

## 2020-02-22 DIAGNOSIS — I6529 Occlusion and stenosis of unspecified carotid artery: Secondary | ICD-10-CM | POA: Diagnosis not present

## 2020-02-22 DIAGNOSIS — R251 Tremor, unspecified: Secondary | ICD-10-CM | POA: Diagnosis not present

## 2020-02-22 DIAGNOSIS — H3581 Retinal edema: Secondary | ICD-10-CM | POA: Diagnosis not present

## 2020-02-22 DIAGNOSIS — E860 Dehydration: Secondary | ICD-10-CM | POA: Diagnosis not present

## 2020-02-22 DIAGNOSIS — Z432 Encounter for attention to ileostomy: Secondary | ICD-10-CM | POA: Diagnosis not present

## 2020-02-22 DIAGNOSIS — S8264XD Nondisplaced fracture of lateral malleolus of right fibula, subsequent encounter for closed fracture with routine healing: Secondary | ICD-10-CM | POA: Diagnosis not present

## 2020-02-22 DIAGNOSIS — R202 Paresthesia of skin: Secondary | ICD-10-CM | POA: Diagnosis not present

## 2020-02-22 DIAGNOSIS — G40909 Epilepsy, unspecified, not intractable, without status epilepticus: Secondary | ICD-10-CM | POA: Diagnosis not present

## 2020-02-22 DIAGNOSIS — N814 Uterovaginal prolapse, unspecified: Secondary | ICD-10-CM | POA: Diagnosis not present

## 2020-02-22 DIAGNOSIS — G4709 Other insomnia: Secondary | ICD-10-CM | POA: Diagnosis not present

## 2020-02-22 DIAGNOSIS — F4310 Post-traumatic stress disorder, unspecified: Secondary | ICD-10-CM | POA: Diagnosis not present

## 2020-02-22 DIAGNOSIS — R739 Hyperglycemia, unspecified: Secondary | ICD-10-CM | POA: Diagnosis not present

## 2020-02-22 DIAGNOSIS — E039 Hypothyroidism, unspecified: Secondary | ICD-10-CM | POA: Diagnosis not present

## 2020-02-22 DIAGNOSIS — R030 Elevated blood-pressure reading, without diagnosis of hypertension: Secondary | ICD-10-CM | POA: Diagnosis not present

## 2020-02-22 DIAGNOSIS — E8881 Metabolic syndrome: Secondary | ICD-10-CM | POA: Diagnosis not present

## 2020-02-22 DIAGNOSIS — Z87442 Personal history of urinary calculi: Secondary | ICD-10-CM | POA: Diagnosis not present

## 2020-02-22 DIAGNOSIS — F4321 Adjustment disorder with depressed mood: Secondary | ICD-10-CM | POA: Diagnosis not present

## 2020-02-22 DIAGNOSIS — I350 Nonrheumatic aortic (valve) stenosis: Secondary | ICD-10-CM | POA: Diagnosis not present

## 2020-02-22 DIAGNOSIS — M545 Low back pain: Secondary | ICD-10-CM | POA: Diagnosis not present

## 2020-02-22 DIAGNOSIS — D500 Iron deficiency anemia secondary to blood loss (chronic): Secondary | ICD-10-CM | POA: Diagnosis not present

## 2020-02-22 DIAGNOSIS — H353231 Exudative age-related macular degeneration, bilateral, with active choroidal neovascularization: Secondary | ICD-10-CM | POA: Diagnosis not present

## 2020-02-22 DIAGNOSIS — E118 Type 2 diabetes mellitus with unspecified complications: Secondary | ICD-10-CM | POA: Diagnosis not present

## 2020-02-22 DIAGNOSIS — D509 Iron deficiency anemia, unspecified: Secondary | ICD-10-CM | POA: Diagnosis not present

## 2020-02-22 DIAGNOSIS — R911 Solitary pulmonary nodule: Secondary | ICD-10-CM | POA: Diagnosis not present

## 2020-02-22 DIAGNOSIS — G4736 Sleep related hypoventilation in conditions classified elsewhere: Secondary | ICD-10-CM | POA: Diagnosis not present

## 2020-02-22 DIAGNOSIS — E1122 Type 2 diabetes mellitus with diabetic chronic kidney disease: Secondary | ICD-10-CM | POA: Diagnosis not present

## 2020-02-22 DIAGNOSIS — H401132 Primary open-angle glaucoma, bilateral, moderate stage: Secondary | ICD-10-CM | POA: Diagnosis not present

## 2020-02-22 DIAGNOSIS — Z954 Presence of other heart-valve replacement: Secondary | ICD-10-CM | POA: Diagnosis not present

## 2020-02-22 DIAGNOSIS — R601 Generalized edema: Secondary | ICD-10-CM | POA: Diagnosis not present

## 2020-02-22 DIAGNOSIS — N2581 Secondary hyperparathyroidism of renal origin: Secondary | ICD-10-CM | POA: Diagnosis not present

## 2020-02-22 DIAGNOSIS — L820 Inflamed seborrheic keratosis: Secondary | ICD-10-CM | POA: Diagnosis not present

## 2020-02-22 DIAGNOSIS — M519 Unspecified thoracic, thoracolumbar and lumbosacral intervertebral disc disorder: Secondary | ICD-10-CM | POA: Diagnosis not present

## 2020-02-22 DIAGNOSIS — I723 Aneurysm of iliac artery: Secondary | ICD-10-CM | POA: Diagnosis not present

## 2020-02-22 DIAGNOSIS — N3946 Mixed incontinence: Secondary | ICD-10-CM | POA: Diagnosis not present

## 2020-02-22 DIAGNOSIS — Z981 Arthrodesis status: Secondary | ICD-10-CM | POA: Diagnosis not present

## 2020-02-22 DIAGNOSIS — R7301 Impaired fasting glucose: Secondary | ICD-10-CM | POA: Diagnosis not present

## 2020-02-22 DIAGNOSIS — M5489 Other dorsalgia: Secondary | ICD-10-CM | POA: Diagnosis not present

## 2020-02-22 DIAGNOSIS — M349 Systemic sclerosis, unspecified: Secondary | ICD-10-CM | POA: Diagnosis not present

## 2020-02-22 DIAGNOSIS — I059 Rheumatic mitral valve disease, unspecified: Secondary | ICD-10-CM | POA: Diagnosis not present

## 2020-02-22 DIAGNOSIS — H25041 Posterior subcapsular polar age-related cataract, right eye: Secondary | ICD-10-CM | POA: Diagnosis not present

## 2020-02-22 DIAGNOSIS — H10413 Chronic giant papillary conjunctivitis, bilateral: Secondary | ICD-10-CM | POA: Diagnosis not present

## 2020-02-22 DIAGNOSIS — H25811 Combined forms of age-related cataract, right eye: Secondary | ICD-10-CM | POA: Diagnosis not present

## 2020-02-22 DIAGNOSIS — L0201 Cutaneous abscess of face: Secondary | ICD-10-CM | POA: Diagnosis not present

## 2020-02-22 DIAGNOSIS — D6869 Other thrombophilia: Secondary | ICD-10-CM | POA: Diagnosis not present

## 2020-02-22 DIAGNOSIS — Z91048 Other nonmedicinal substance allergy status: Secondary | ICD-10-CM | POA: Diagnosis not present

## 2020-02-22 DIAGNOSIS — R0902 Hypoxemia: Secondary | ICD-10-CM | POA: Diagnosis not present

## 2020-02-22 DIAGNOSIS — Z419 Encounter for procedure for purposes other than remedying health state, unspecified: Secondary | ICD-10-CM | POA: Diagnosis not present

## 2020-02-22 DIAGNOSIS — M533 Sacrococcygeal disorders, not elsewhere classified: Secondary | ICD-10-CM | POA: Diagnosis not present

## 2020-02-22 DIAGNOSIS — C7802 Secondary malignant neoplasm of left lung: Secondary | ICD-10-CM | POA: Diagnosis not present

## 2020-02-22 DIAGNOSIS — R2681 Unsteadiness on feet: Secondary | ICD-10-CM | POA: Diagnosis not present

## 2020-02-22 DIAGNOSIS — R1084 Generalized abdominal pain: Secondary | ICD-10-CM | POA: Diagnosis not present

## 2020-02-22 DIAGNOSIS — N509 Disorder of male genital organs, unspecified: Secondary | ICD-10-CM | POA: Diagnosis not present

## 2020-02-22 DIAGNOSIS — R0789 Other chest pain: Secondary | ICD-10-CM | POA: Diagnosis not present

## 2020-02-22 DIAGNOSIS — M5116 Intervertebral disc disorders with radiculopathy, lumbar region: Secondary | ICD-10-CM | POA: Diagnosis not present

## 2020-02-22 DIAGNOSIS — K2970 Gastritis, unspecified, without bleeding: Secondary | ICD-10-CM | POA: Diagnosis not present

## 2020-02-22 DIAGNOSIS — E789 Disorder of lipoprotein metabolism, unspecified: Secondary | ICD-10-CM | POA: Diagnosis not present

## 2020-02-22 DIAGNOSIS — E236 Other disorders of pituitary gland: Secondary | ICD-10-CM | POA: Diagnosis not present

## 2020-02-22 DIAGNOSIS — R269 Unspecified abnormalities of gait and mobility: Secondary | ICD-10-CM | POA: Diagnosis not present

## 2020-02-22 DIAGNOSIS — Z7189 Other specified counseling: Secondary | ICD-10-CM | POA: Diagnosis not present

## 2020-02-22 DIAGNOSIS — M5136 Other intervertebral disc degeneration, lumbar region: Secondary | ICD-10-CM | POA: Diagnosis not present

## 2020-02-22 DIAGNOSIS — G939 Disorder of brain, unspecified: Secondary | ICD-10-CM | POA: Diagnosis not present

## 2020-02-22 DIAGNOSIS — M419 Scoliosis, unspecified: Secondary | ICD-10-CM | POA: Diagnosis not present

## 2020-02-22 DIAGNOSIS — K5901 Slow transit constipation: Secondary | ICD-10-CM | POA: Diagnosis not present

## 2020-02-22 DIAGNOSIS — R600 Localized edema: Secondary | ICD-10-CM | POA: Diagnosis not present

## 2020-02-22 DIAGNOSIS — M792 Neuralgia and neuritis, unspecified: Secondary | ICD-10-CM | POA: Diagnosis not present

## 2020-02-22 DIAGNOSIS — Z993 Dependence on wheelchair: Secondary | ICD-10-CM | POA: Diagnosis not present

## 2020-02-22 DIAGNOSIS — E278 Other specified disorders of adrenal gland: Secondary | ICD-10-CM | POA: Diagnosis not present

## 2020-02-22 DIAGNOSIS — F430 Acute stress reaction: Secondary | ICD-10-CM | POA: Diagnosis not present

## 2020-02-22 DIAGNOSIS — F0391 Unspecified dementia with behavioral disturbance: Secondary | ICD-10-CM | POA: Diagnosis not present

## 2020-02-22 DIAGNOSIS — S336XXA Sprain of sacroiliac joint, initial encounter: Secondary | ICD-10-CM | POA: Diagnosis not present

## 2020-02-22 DIAGNOSIS — Z961 Presence of intraocular lens: Secondary | ICD-10-CM | POA: Diagnosis not present

## 2020-02-22 DIAGNOSIS — Z720 Tobacco use: Secondary | ICD-10-CM | POA: Diagnosis not present

## 2020-02-22 DIAGNOSIS — R809 Proteinuria, unspecified: Secondary | ICD-10-CM | POA: Diagnosis not present

## 2020-02-22 DIAGNOSIS — I4821 Permanent atrial fibrillation: Secondary | ICD-10-CM | POA: Diagnosis not present

## 2020-02-22 DIAGNOSIS — I25110 Atherosclerotic heart disease of native coronary artery with unstable angina pectoris: Secondary | ICD-10-CM | POA: Diagnosis not present

## 2020-02-22 DIAGNOSIS — F419 Anxiety disorder, unspecified: Secondary | ICD-10-CM | POA: Diagnosis not present

## 2020-02-22 DIAGNOSIS — K5641 Fecal impaction: Secondary | ICD-10-CM | POA: Diagnosis not present

## 2020-02-22 DIAGNOSIS — R922 Inconclusive mammogram: Secondary | ICD-10-CM | POA: Diagnosis not present

## 2020-02-22 DIAGNOSIS — I679 Cerebrovascular disease, unspecified: Secondary | ICD-10-CM | POA: Diagnosis not present

## 2020-02-22 DIAGNOSIS — R7401 Elevation of levels of liver transaminase levels: Secondary | ICD-10-CM | POA: Diagnosis not present

## 2020-02-22 DIAGNOSIS — M79675 Pain in left toe(s): Secondary | ICD-10-CM | POA: Diagnosis not present

## 2020-02-22 DIAGNOSIS — Z87820 Personal history of traumatic brain injury: Secondary | ICD-10-CM | POA: Diagnosis not present

## 2020-02-22 DIAGNOSIS — M79674 Pain in right toe(s): Secondary | ICD-10-CM | POA: Diagnosis not present

## 2020-02-22 DIAGNOSIS — F339 Major depressive disorder, recurrent, unspecified: Secondary | ICD-10-CM | POA: Diagnosis not present

## 2020-02-22 DIAGNOSIS — F4312 Post-traumatic stress disorder, chronic: Secondary | ICD-10-CM | POA: Diagnosis not present

## 2020-02-22 DIAGNOSIS — G40409 Other generalized epilepsy and epileptic syndromes, not intractable, without status epilepticus: Secondary | ICD-10-CM | POA: Diagnosis not present

## 2020-02-22 DIAGNOSIS — Z515 Encounter for palliative care: Secondary | ICD-10-CM | POA: Diagnosis not present

## 2020-02-22 DIAGNOSIS — M5431 Sciatica, right side: Secondary | ICD-10-CM | POA: Diagnosis not present

## 2020-02-22 DIAGNOSIS — L300 Nummular dermatitis: Secondary | ICD-10-CM | POA: Diagnosis not present

## 2020-02-22 DIAGNOSIS — E211 Secondary hyperparathyroidism, not elsewhere classified: Secondary | ICD-10-CM | POA: Diagnosis not present

## 2020-02-22 DIAGNOSIS — A429 Actinomycosis, unspecified: Secondary | ICD-10-CM | POA: Diagnosis not present

## 2020-02-22 DIAGNOSIS — H40019 Open angle with borderline findings, low risk, unspecified eye: Secondary | ICD-10-CM | POA: Diagnosis not present

## 2020-02-22 DIAGNOSIS — H353211 Exudative age-related macular degeneration, right eye, with active choroidal neovascularization: Secondary | ICD-10-CM | POA: Diagnosis not present

## 2020-02-22 DIAGNOSIS — M7712 Lateral epicondylitis, left elbow: Secondary | ICD-10-CM | POA: Diagnosis not present

## 2020-02-22 DIAGNOSIS — E031 Congenital hypothyroidism without goiter: Secondary | ICD-10-CM | POA: Diagnosis not present

## 2020-02-22 DIAGNOSIS — K651 Peritoneal abscess: Secondary | ICD-10-CM | POA: Diagnosis not present

## 2020-02-22 DIAGNOSIS — I77819 Aortic ectasia, unspecified site: Secondary | ICD-10-CM | POA: Diagnosis not present

## 2020-02-22 DIAGNOSIS — M4012 Other secondary kyphosis, cervical region: Secondary | ICD-10-CM | POA: Diagnosis not present

## 2020-02-22 DIAGNOSIS — K4091 Unilateral inguinal hernia, without obstruction or gangrene, recurrent: Secondary | ICD-10-CM | POA: Diagnosis not present

## 2020-02-22 DIAGNOSIS — R062 Wheezing: Secondary | ICD-10-CM | POA: Diagnosis not present

## 2020-02-22 DIAGNOSIS — N411 Chronic prostatitis: Secondary | ICD-10-CM | POA: Diagnosis not present

## 2020-02-22 DIAGNOSIS — K7469 Other cirrhosis of liver: Secondary | ICD-10-CM | POA: Diagnosis not present

## 2020-02-22 DIAGNOSIS — Z86718 Personal history of other venous thrombosis and embolism: Secondary | ICD-10-CM | POA: Diagnosis not present

## 2020-02-22 DIAGNOSIS — R5383 Other fatigue: Secondary | ICD-10-CM | POA: Diagnosis not present

## 2020-02-22 DIAGNOSIS — E041 Nontoxic single thyroid nodule: Secondary | ICD-10-CM | POA: Diagnosis not present

## 2020-02-22 DIAGNOSIS — Z86010 Personal history of colonic polyps: Secondary | ICD-10-CM | POA: Diagnosis not present

## 2020-02-22 DIAGNOSIS — Z122 Encounter for screening for malignant neoplasm of respiratory organs: Secondary | ICD-10-CM | POA: Diagnosis not present

## 2020-02-22 DIAGNOSIS — B0229 Other postherpetic nervous system involvement: Secondary | ICD-10-CM | POA: Diagnosis not present

## 2020-02-22 DIAGNOSIS — M25511 Pain in right shoulder: Secondary | ICD-10-CM | POA: Diagnosis not present

## 2020-02-22 DIAGNOSIS — H2513 Age-related nuclear cataract, bilateral: Secondary | ICD-10-CM | POA: Diagnosis not present

## 2020-02-22 DIAGNOSIS — S39012A Strain of muscle, fascia and tendon of lower back, initial encounter: Secondary | ICD-10-CM | POA: Diagnosis not present

## 2020-02-22 DIAGNOSIS — D72820 Lymphocytosis (symptomatic): Secondary | ICD-10-CM | POA: Diagnosis not present

## 2020-02-22 DIAGNOSIS — M6289 Other specified disorders of muscle: Secondary | ICD-10-CM | POA: Diagnosis not present

## 2020-02-22 DIAGNOSIS — K7400 Hepatic fibrosis, unspecified: Secondary | ICD-10-CM | POA: Diagnosis not present

## 2020-02-22 DIAGNOSIS — M19072 Primary osteoarthritis, left ankle and foot: Secondary | ICD-10-CM | POA: Diagnosis not present

## 2020-02-22 DIAGNOSIS — G250 Essential tremor: Secondary | ICD-10-CM | POA: Diagnosis not present

## 2020-02-22 DIAGNOSIS — M7751 Other enthesopathy of right foot: Secondary | ICD-10-CM | POA: Diagnosis not present

## 2020-02-22 DIAGNOSIS — R64 Cachexia: Secondary | ICD-10-CM | POA: Diagnosis not present

## 2020-02-22 DIAGNOSIS — M4326 Fusion of spine, lumbar region: Secondary | ICD-10-CM | POA: Diagnosis not present

## 2020-02-22 DIAGNOSIS — E441 Mild protein-calorie malnutrition: Secondary | ICD-10-CM | POA: Diagnosis not present

## 2020-02-22 DIAGNOSIS — M25462 Effusion, left knee: Secondary | ICD-10-CM | POA: Diagnosis not present

## 2020-02-22 DIAGNOSIS — H35033 Hypertensive retinopathy, bilateral: Secondary | ICD-10-CM | POA: Diagnosis not present

## 2020-02-22 DIAGNOSIS — R1030 Lower abdominal pain, unspecified: Secondary | ICD-10-CM | POA: Diagnosis not present

## 2020-02-22 DIAGNOSIS — R3121 Asymptomatic microscopic hematuria: Secondary | ICD-10-CM | POA: Diagnosis not present

## 2020-02-22 DIAGNOSIS — Z8371 Family history of colonic polyps: Secondary | ICD-10-CM | POA: Diagnosis not present

## 2020-02-22 DIAGNOSIS — I340 Nonrheumatic mitral (valve) insufficiency: Secondary | ICD-10-CM | POA: Diagnosis not present

## 2020-02-22 DIAGNOSIS — R278 Other lack of coordination: Secondary | ICD-10-CM | POA: Diagnosis not present

## 2020-02-22 DIAGNOSIS — I87311 Chronic venous hypertension (idiopathic) with ulcer of right lower extremity: Secondary | ICD-10-CM | POA: Diagnosis not present

## 2020-02-22 DIAGNOSIS — L97811 Non-pressure chronic ulcer of other part of right lower leg limited to breakdown of skin: Secondary | ICD-10-CM | POA: Diagnosis not present

## 2020-02-22 DIAGNOSIS — G252 Other specified forms of tremor: Secondary | ICD-10-CM | POA: Diagnosis not present

## 2020-02-22 DIAGNOSIS — K769 Liver disease, unspecified: Secondary | ICD-10-CM | POA: Diagnosis not present

## 2020-02-22 DIAGNOSIS — Z510 Encounter for antineoplastic radiation therapy: Secondary | ICD-10-CM | POA: Diagnosis not present

## 2020-02-22 DIAGNOSIS — M47814 Spondylosis without myelopathy or radiculopathy, thoracic region: Secondary | ICD-10-CM | POA: Diagnosis not present

## 2020-02-22 DIAGNOSIS — D696 Thrombocytopenia, unspecified: Secondary | ICD-10-CM | POA: Diagnosis not present

## 2020-02-22 DIAGNOSIS — G4734 Idiopathic sleep related nonobstructive alveolar hypoventilation: Secondary | ICD-10-CM | POA: Diagnosis not present

## 2020-02-22 DIAGNOSIS — H348322 Tributary (branch) retinal vein occlusion, left eye, stable: Secondary | ICD-10-CM | POA: Diagnosis not present

## 2020-02-22 DIAGNOSIS — N3010 Interstitial cystitis (chronic) without hematuria: Secondary | ICD-10-CM | POA: Diagnosis not present

## 2020-02-22 DIAGNOSIS — Z86711 Personal history of pulmonary embolism: Secondary | ICD-10-CM | POA: Diagnosis not present

## 2020-02-22 DIAGNOSIS — R770 Abnormality of albumin: Secondary | ICD-10-CM | POA: Diagnosis not present

## 2020-02-22 DIAGNOSIS — M25631 Stiffness of right wrist, not elsewhere classified: Secondary | ICD-10-CM | POA: Diagnosis not present

## 2020-02-22 DIAGNOSIS — F0390 Unspecified dementia without behavioral disturbance: Secondary | ICD-10-CM | POA: Diagnosis not present

## 2020-02-22 DIAGNOSIS — R569 Unspecified convulsions: Secondary | ICD-10-CM | POA: Diagnosis not present

## 2020-02-22 DIAGNOSIS — X32XXXD Exposure to sunlight, subsequent encounter: Secondary | ICD-10-CM | POA: Diagnosis not present

## 2020-02-22 DIAGNOSIS — E7800 Pure hypercholesterolemia, unspecified: Secondary | ICD-10-CM | POA: Diagnosis not present

## 2020-02-22 DIAGNOSIS — M5412 Radiculopathy, cervical region: Secondary | ICD-10-CM | POA: Diagnosis not present

## 2020-02-22 DIAGNOSIS — R7989 Other specified abnormal findings of blood chemistry: Secondary | ICD-10-CM | POA: Diagnosis not present

## 2020-02-22 DIAGNOSIS — I5023 Acute on chronic systolic (congestive) heart failure: Secondary | ICD-10-CM | POA: Diagnosis not present

## 2020-02-22 DIAGNOSIS — L853 Xerosis cutis: Secondary | ICD-10-CM | POA: Diagnosis not present

## 2020-02-22 DIAGNOSIS — Z801 Family history of malignant neoplasm of trachea, bronchus and lung: Secondary | ICD-10-CM | POA: Diagnosis not present

## 2020-02-22 DIAGNOSIS — J849 Interstitial pulmonary disease, unspecified: Secondary | ICD-10-CM | POA: Diagnosis not present

## 2020-02-22 DIAGNOSIS — L97222 Non-pressure chronic ulcer of left calf with fat layer exposed: Secondary | ICD-10-CM | POA: Diagnosis not present

## 2020-02-22 DIAGNOSIS — Z96641 Presence of right artificial hip joint: Secondary | ICD-10-CM | POA: Diagnosis not present

## 2020-02-22 DIAGNOSIS — I739 Peripheral vascular disease, unspecified: Secondary | ICD-10-CM | POA: Diagnosis not present

## 2020-02-22 DIAGNOSIS — B372 Candidiasis of skin and nail: Secondary | ICD-10-CM | POA: Diagnosis not present

## 2020-02-22 DIAGNOSIS — L821 Other seborrheic keratosis: Secondary | ICD-10-CM | POA: Diagnosis not present

## 2020-02-22 DIAGNOSIS — S8391XD Sprain of unspecified site of right knee, subsequent encounter: Secondary | ICD-10-CM | POA: Diagnosis not present

## 2020-02-22 DIAGNOSIS — M8000XD Age-related osteoporosis with current pathological fracture, unspecified site, subsequent encounter for fracture with routine healing: Secondary | ICD-10-CM | POA: Diagnosis not present

## 2020-02-22 DIAGNOSIS — L812 Freckles: Secondary | ICD-10-CM | POA: Diagnosis not present

## 2020-02-22 DIAGNOSIS — M2012 Hallux valgus (acquired), left foot: Secondary | ICD-10-CM | POA: Diagnosis not present

## 2020-02-22 DIAGNOSIS — W19XXXA Unspecified fall, initial encounter: Secondary | ICD-10-CM | POA: Diagnosis not present

## 2020-02-22 DIAGNOSIS — K219 Gastro-esophageal reflux disease without esophagitis: Secondary | ICD-10-CM | POA: Diagnosis not present

## 2020-02-22 DIAGNOSIS — E113313 Type 2 diabetes mellitus with moderate nonproliferative diabetic retinopathy with macular edema, bilateral: Secondary | ICD-10-CM | POA: Diagnosis not present

## 2020-02-22 DIAGNOSIS — X32XXXA Exposure to sunlight, initial encounter: Secondary | ICD-10-CM | POA: Diagnosis not present

## 2020-02-22 DIAGNOSIS — F4323 Adjustment disorder with mixed anxiety and depressed mood: Secondary | ICD-10-CM | POA: Diagnosis not present

## 2020-02-22 DIAGNOSIS — R079 Chest pain, unspecified: Secondary | ICD-10-CM | POA: Diagnosis not present

## 2020-02-22 DIAGNOSIS — J986 Disorders of diaphragm: Secondary | ICD-10-CM | POA: Diagnosis not present

## 2020-02-22 DIAGNOSIS — H353131 Nonexudative age-related macular degeneration, bilateral, early dry stage: Secondary | ICD-10-CM | POA: Diagnosis not present

## 2020-02-22 DIAGNOSIS — E7849 Other hyperlipidemia: Secondary | ICD-10-CM | POA: Diagnosis not present

## 2020-02-22 DIAGNOSIS — H348121 Central retinal vein occlusion, left eye, with retinal neovascularization: Secondary | ICD-10-CM | POA: Diagnosis not present

## 2020-02-22 DIAGNOSIS — F3176 Bipolar disorder, in full remission, most recent episode depressed: Secondary | ICD-10-CM | POA: Diagnosis not present

## 2020-02-22 DIAGNOSIS — I77810 Thoracic aortic ectasia: Secondary | ICD-10-CM | POA: Diagnosis not present

## 2020-02-22 DIAGNOSIS — I119 Hypertensive heart disease without heart failure: Secondary | ICD-10-CM | POA: Diagnosis not present

## 2020-02-22 DIAGNOSIS — F309 Manic episode, unspecified: Secondary | ICD-10-CM | POA: Diagnosis not present

## 2020-02-22 DIAGNOSIS — C189 Malignant neoplasm of colon, unspecified: Secondary | ICD-10-CM | POA: Diagnosis not present

## 2020-02-22 DIAGNOSIS — G4700 Insomnia, unspecified: Secondary | ICD-10-CM | POA: Diagnosis not present

## 2020-02-22 DIAGNOSIS — S30861A Insect bite (nonvenomous) of abdominal wall, initial encounter: Secondary | ICD-10-CM | POA: Diagnosis not present

## 2020-02-22 DIAGNOSIS — Q828 Other specified congenital malformations of skin: Secondary | ICD-10-CM | POA: Diagnosis not present

## 2020-02-22 DIAGNOSIS — M25569 Pain in unspecified knee: Secondary | ICD-10-CM | POA: Diagnosis not present

## 2020-02-22 DIAGNOSIS — Z131 Encounter for screening for diabetes mellitus: Secondary | ICD-10-CM | POA: Diagnosis not present

## 2020-02-22 DIAGNOSIS — K7581 Nonalcoholic steatohepatitis (NASH): Secondary | ICD-10-CM | POA: Diagnosis not present

## 2020-02-22 DIAGNOSIS — N3001 Acute cystitis with hematuria: Secondary | ICD-10-CM | POA: Diagnosis not present

## 2020-02-22 DIAGNOSIS — D692 Other nonthrombocytopenic purpura: Secondary | ICD-10-CM | POA: Diagnosis not present

## 2020-02-22 DIAGNOSIS — M546 Pain in thoracic spine: Secondary | ICD-10-CM | POA: Diagnosis not present

## 2020-02-22 DIAGNOSIS — H04123 Dry eye syndrome of bilateral lacrimal glands: Secondary | ICD-10-CM | POA: Diagnosis not present

## 2020-02-22 DIAGNOSIS — M955 Acquired deformity of pelvis: Secondary | ICD-10-CM | POA: Diagnosis not present

## 2020-02-22 DIAGNOSIS — D229 Melanocytic nevi, unspecified: Secondary | ICD-10-CM | POA: Diagnosis not present

## 2020-02-22 DIAGNOSIS — G43809 Other migraine, not intractable, without status migrainosus: Secondary | ICD-10-CM | POA: Diagnosis not present

## 2020-02-22 DIAGNOSIS — F3178 Bipolar disorder, in full remission, most recent episode mixed: Secondary | ICD-10-CM | POA: Diagnosis not present

## 2020-02-22 DIAGNOSIS — R748 Abnormal levels of other serum enzymes: Secondary | ICD-10-CM | POA: Diagnosis not present

## 2020-02-22 DIAGNOSIS — H02831 Dermatochalasis of right upper eyelid: Secondary | ICD-10-CM | POA: Diagnosis not present

## 2020-02-22 DIAGNOSIS — J069 Acute upper respiratory infection, unspecified: Secondary | ICD-10-CM | POA: Diagnosis not present

## 2020-02-22 DIAGNOSIS — H1045 Other chronic allergic conjunctivitis: Secondary | ICD-10-CM | POA: Diagnosis not present

## 2020-02-22 DIAGNOSIS — C61 Malignant neoplasm of prostate: Secondary | ICD-10-CM | POA: Diagnosis not present

## 2020-02-22 DIAGNOSIS — I2699 Other pulmonary embolism without acute cor pulmonale: Secondary | ICD-10-CM | POA: Diagnosis not present

## 2020-02-22 DIAGNOSIS — S0006XA Insect bite (nonvenomous) of scalp, initial encounter: Secondary | ICD-10-CM | POA: Diagnosis not present

## 2020-02-22 DIAGNOSIS — M79645 Pain in left finger(s): Secondary | ICD-10-CM | POA: Diagnosis not present

## 2020-02-22 DIAGNOSIS — R293 Abnormal posture: Secondary | ICD-10-CM | POA: Diagnosis not present

## 2020-02-22 DIAGNOSIS — I83893 Varicose veins of bilateral lower extremities with other complications: Secondary | ICD-10-CM | POA: Diagnosis not present

## 2020-02-22 DIAGNOSIS — E871 Hypo-osmolality and hyponatremia: Secondary | ICD-10-CM | POA: Diagnosis not present

## 2020-02-22 DIAGNOSIS — R4182 Altered mental status, unspecified: Secondary | ICD-10-CM | POA: Diagnosis not present

## 2020-02-22 DIAGNOSIS — E034 Atrophy of thyroid (acquired): Secondary | ICD-10-CM | POA: Diagnosis not present

## 2020-02-22 DIAGNOSIS — Z933 Colostomy status: Secondary | ICD-10-CM | POA: Diagnosis not present

## 2020-02-22 DIAGNOSIS — G9009 Other idiopathic peripheral autonomic neuropathy: Secondary | ICD-10-CM | POA: Diagnosis not present

## 2020-02-22 DIAGNOSIS — J449 Chronic obstructive pulmonary disease, unspecified: Secondary | ICD-10-CM | POA: Diagnosis not present

## 2020-02-22 DIAGNOSIS — Z452 Encounter for adjustment and management of vascular access device: Secondary | ICD-10-CM | POA: Diagnosis not present

## 2020-02-22 DIAGNOSIS — Z96652 Presence of left artificial knee joint: Secondary | ICD-10-CM | POA: Diagnosis not present

## 2020-02-22 DIAGNOSIS — M25562 Pain in left knee: Secondary | ICD-10-CM | POA: Diagnosis not present

## 2020-02-22 DIAGNOSIS — H608X3 Other otitis externa, bilateral: Secondary | ICD-10-CM | POA: Diagnosis not present

## 2020-02-22 DIAGNOSIS — M5417 Radiculopathy, lumbosacral region: Secondary | ICD-10-CM | POA: Diagnosis not present

## 2020-02-22 DIAGNOSIS — I110 Hypertensive heart disease with heart failure: Secondary | ICD-10-CM | POA: Diagnosis not present

## 2020-02-22 DIAGNOSIS — I5042 Chronic combined systolic (congestive) and diastolic (congestive) heart failure: Secondary | ICD-10-CM | POA: Diagnosis not present

## 2020-02-22 DIAGNOSIS — R197 Diarrhea, unspecified: Secondary | ICD-10-CM | POA: Diagnosis not present

## 2020-02-22 DIAGNOSIS — F5104 Psychophysiologic insomnia: Secondary | ICD-10-CM | POA: Diagnosis not present

## 2020-02-22 DIAGNOSIS — M25461 Effusion, right knee: Secondary | ICD-10-CM | POA: Diagnosis not present

## 2020-02-22 DIAGNOSIS — Z7902 Long term (current) use of antithrombotics/antiplatelets: Secondary | ICD-10-CM | POA: Diagnosis not present

## 2020-02-22 DIAGNOSIS — K5730 Diverticulosis of large intestine without perforation or abscess without bleeding: Secondary | ICD-10-CM | POA: Diagnosis not present

## 2020-02-22 DIAGNOSIS — Z7289 Other problems related to lifestyle: Secondary | ICD-10-CM | POA: Diagnosis not present

## 2020-02-22 DIAGNOSIS — H18592 Other hereditary corneal dystrophies, left eye: Secondary | ICD-10-CM | POA: Diagnosis not present

## 2020-02-22 DIAGNOSIS — Z7952 Long term (current) use of systemic steroids: Secondary | ICD-10-CM | POA: Diagnosis not present

## 2020-02-22 DIAGNOSIS — M2560 Stiffness of unspecified joint, not elsewhere classified: Secondary | ICD-10-CM | POA: Diagnosis not present

## 2020-02-22 DIAGNOSIS — M25552 Pain in left hip: Secondary | ICD-10-CM | POA: Diagnosis not present

## 2020-02-22 DIAGNOSIS — T63451D Toxic effect of venom of hornets, accidental (unintentional), subsequent encounter: Secondary | ICD-10-CM | POA: Diagnosis not present

## 2020-02-22 DIAGNOSIS — Z6828 Body mass index (BMI) 28.0-28.9, adult: Secondary | ICD-10-CM | POA: Diagnosis not present

## 2020-02-22 DIAGNOSIS — E2839 Other primary ovarian failure: Secondary | ICD-10-CM | POA: Diagnosis not present

## 2020-02-22 DIAGNOSIS — L918 Other hypertrophic disorders of the skin: Secondary | ICD-10-CM | POA: Diagnosis not present

## 2020-02-22 DIAGNOSIS — D2372 Other benign neoplasm of skin of left lower limb, including hip: Secondary | ICD-10-CM | POA: Diagnosis not present

## 2020-02-22 DIAGNOSIS — R071 Chest pain on breathing: Secondary | ICD-10-CM | POA: Diagnosis not present

## 2020-02-22 DIAGNOSIS — J441 Chronic obstructive pulmonary disease with (acute) exacerbation: Secondary | ICD-10-CM | POA: Diagnosis not present

## 2020-02-22 DIAGNOSIS — S32030A Wedge compression fracture of third lumbar vertebra, initial encounter for closed fracture: Secondary | ICD-10-CM | POA: Diagnosis not present

## 2020-02-22 DIAGNOSIS — M7062 Trochanteric bursitis, left hip: Secondary | ICD-10-CM | POA: Diagnosis not present

## 2020-02-22 DIAGNOSIS — Z85118 Personal history of other malignant neoplasm of bronchus and lung: Secondary | ICD-10-CM | POA: Diagnosis not present

## 2020-02-22 DIAGNOSIS — T8189XA Other complications of procedures, not elsewhere classified, initial encounter: Secondary | ICD-10-CM | POA: Diagnosis not present

## 2020-02-22 DIAGNOSIS — R55 Syncope and collapse: Secondary | ICD-10-CM | POA: Diagnosis not present

## 2020-02-22 DIAGNOSIS — H353233 Exudative age-related macular degeneration, bilateral, with inactive scar: Secondary | ICD-10-CM | POA: Diagnosis not present

## 2020-02-22 DIAGNOSIS — J4520 Mild intermittent asthma, uncomplicated: Secondary | ICD-10-CM | POA: Diagnosis not present

## 2020-02-22 DIAGNOSIS — Z01810 Encounter for preprocedural cardiovascular examination: Secondary | ICD-10-CM | POA: Diagnosis not present

## 2020-02-22 DIAGNOSIS — K649 Unspecified hemorrhoids: Secondary | ICD-10-CM | POA: Diagnosis not present

## 2020-02-22 DIAGNOSIS — I700 Atherosclerosis of aorta: Secondary | ICD-10-CM | POA: Diagnosis not present

## 2020-02-22 DIAGNOSIS — M1A49X0 Other secondary chronic gout, multiple sites, without tophus (tophi): Secondary | ICD-10-CM | POA: Diagnosis not present

## 2020-02-22 DIAGNOSIS — M25571 Pain in right ankle and joints of right foot: Secondary | ICD-10-CM | POA: Diagnosis not present

## 2020-02-22 DIAGNOSIS — I83812 Varicose veins of left lower extremities with pain: Secondary | ICD-10-CM | POA: Diagnosis not present

## 2020-02-22 DIAGNOSIS — E1151 Type 2 diabetes mellitus with diabetic peripheral angiopathy without gangrene: Secondary | ICD-10-CM | POA: Diagnosis not present

## 2020-02-22 DIAGNOSIS — C50911 Malignant neoplasm of unspecified site of right female breast: Secondary | ICD-10-CM | POA: Diagnosis not present

## 2020-02-22 DIAGNOSIS — S82112D Displaced fracture of left tibial spine, subsequent encounter for closed fracture with routine healing: Secondary | ICD-10-CM | POA: Diagnosis not present

## 2020-02-22 DIAGNOSIS — G459 Transient cerebral ischemic attack, unspecified: Secondary | ICD-10-CM | POA: Diagnosis not present

## 2020-02-22 DIAGNOSIS — D2271 Melanocytic nevi of right lower limb, including hip: Secondary | ICD-10-CM | POA: Diagnosis not present

## 2020-02-22 DIAGNOSIS — K529 Noninfective gastroenteritis and colitis, unspecified: Secondary | ICD-10-CM | POA: Diagnosis not present

## 2020-02-22 DIAGNOSIS — Z833 Family history of diabetes mellitus: Secondary | ICD-10-CM | POA: Diagnosis not present

## 2020-02-22 DIAGNOSIS — Z45018 Encounter for adjustment and management of other part of cardiac pacemaker: Secondary | ICD-10-CM | POA: Diagnosis not present

## 2020-02-22 DIAGNOSIS — D127 Benign neoplasm of rectosigmoid junction: Secondary | ICD-10-CM | POA: Diagnosis not present

## 2020-02-22 DIAGNOSIS — H44001 Unspecified purulent endophthalmitis, right eye: Secondary | ICD-10-CM | POA: Diagnosis not present

## 2020-02-22 DIAGNOSIS — I160 Hypertensive urgency: Secondary | ICD-10-CM | POA: Diagnosis not present

## 2020-02-22 DIAGNOSIS — R300 Dysuria: Secondary | ICD-10-CM | POA: Diagnosis not present

## 2020-02-22 DIAGNOSIS — Z9109 Other allergy status, other than to drugs and biological substances: Secondary | ICD-10-CM | POA: Diagnosis not present

## 2020-02-22 DIAGNOSIS — M9961 Osseous and subluxation stenosis of intervertebral foramina of cervical region: Secondary | ICD-10-CM | POA: Diagnosis not present

## 2020-02-22 DIAGNOSIS — Z96653 Presence of artificial knee joint, bilateral: Secondary | ICD-10-CM | POA: Diagnosis not present

## 2020-02-22 DIAGNOSIS — M961 Postlaminectomy syndrome, not elsewhere classified: Secondary | ICD-10-CM | POA: Diagnosis not present

## 2020-02-22 DIAGNOSIS — C44629 Squamous cell carcinoma of skin of left upper limb, including shoulder: Secondary | ICD-10-CM | POA: Diagnosis not present

## 2020-02-22 DIAGNOSIS — H6593 Unspecified nonsuppurative otitis media, bilateral: Secondary | ICD-10-CM | POA: Diagnosis not present

## 2020-02-22 DIAGNOSIS — C50919 Malignant neoplasm of unspecified site of unspecified female breast: Secondary | ICD-10-CM | POA: Diagnosis not present

## 2020-02-22 DIAGNOSIS — I6521 Occlusion and stenosis of right carotid artery: Secondary | ICD-10-CM | POA: Diagnosis not present

## 2020-02-22 DIAGNOSIS — M79661 Pain in right lower leg: Secondary | ICD-10-CM | POA: Diagnosis not present

## 2020-02-22 DIAGNOSIS — R3914 Feeling of incomplete bladder emptying: Secondary | ICD-10-CM | POA: Diagnosis not present

## 2020-02-22 DIAGNOSIS — M4726 Other spondylosis with radiculopathy, lumbar region: Secondary | ICD-10-CM | POA: Diagnosis not present

## 2020-02-22 DIAGNOSIS — I071 Rheumatic tricuspid insufficiency: Secondary | ICD-10-CM | POA: Diagnosis not present

## 2020-02-22 DIAGNOSIS — M75101 Unspecified rotator cuff tear or rupture of right shoulder, not specified as traumatic: Secondary | ICD-10-CM | POA: Diagnosis not present

## 2020-02-22 DIAGNOSIS — L308 Other specified dermatitis: Secondary | ICD-10-CM | POA: Diagnosis not present

## 2020-02-22 DIAGNOSIS — D0461 Carcinoma in situ of skin of right upper limb, including shoulder: Secondary | ICD-10-CM | POA: Diagnosis not present

## 2020-02-22 DIAGNOSIS — Z89422 Acquired absence of other left toe(s): Secondary | ICD-10-CM | POA: Diagnosis not present

## 2020-02-22 DIAGNOSIS — R102 Pelvic and perineal pain: Secondary | ICD-10-CM | POA: Diagnosis not present

## 2020-02-22 DIAGNOSIS — W1839XA Other fall on same level, initial encounter: Secondary | ICD-10-CM | POA: Diagnosis not present

## 2020-02-22 DIAGNOSIS — M79605 Pain in left leg: Secondary | ICD-10-CM | POA: Diagnosis not present

## 2020-02-22 DIAGNOSIS — Y998 Other external cause status: Secondary | ICD-10-CM | POA: Diagnosis not present

## 2020-02-22 DIAGNOSIS — M9905 Segmental and somatic dysfunction of pelvic region: Secondary | ICD-10-CM | POA: Diagnosis not present

## 2020-02-22 DIAGNOSIS — I708 Atherosclerosis of other arteries: Secondary | ICD-10-CM | POA: Diagnosis not present

## 2020-02-22 DIAGNOSIS — I4891 Unspecified atrial fibrillation: Secondary | ICD-10-CM | POA: Diagnosis not present

## 2020-02-22 DIAGNOSIS — Z7409 Other reduced mobility: Secondary | ICD-10-CM | POA: Diagnosis not present

## 2020-02-22 DIAGNOSIS — R531 Weakness: Secondary | ICD-10-CM | POA: Diagnosis not present

## 2020-02-22 DIAGNOSIS — Z9621 Cochlear implant status: Secondary | ICD-10-CM | POA: Diagnosis not present

## 2020-02-22 DIAGNOSIS — H02052 Trichiasis without entropian right lower eyelid: Secondary | ICD-10-CM | POA: Diagnosis not present

## 2020-02-22 DIAGNOSIS — E220 Acromegaly and pituitary gigantism: Secondary | ICD-10-CM | POA: Diagnosis not present

## 2020-02-22 DIAGNOSIS — S46011D Strain of muscle(s) and tendon(s) of the rotator cuff of right shoulder, subsequent encounter: Secondary | ICD-10-CM | POA: Diagnosis not present

## 2020-02-22 DIAGNOSIS — G301 Alzheimer's disease with late onset: Secondary | ICD-10-CM | POA: Diagnosis not present

## 2020-02-22 DIAGNOSIS — F438 Other reactions to severe stress: Secondary | ICD-10-CM | POA: Diagnosis not present

## 2020-02-22 DIAGNOSIS — C210 Malignant neoplasm of anus, unspecified: Secondary | ICD-10-CM | POA: Diagnosis not present

## 2020-02-22 DIAGNOSIS — I5033 Acute on chronic diastolic (congestive) heart failure: Secondary | ICD-10-CM | POA: Diagnosis not present

## 2020-02-22 DIAGNOSIS — M810 Age-related osteoporosis without current pathological fracture: Secondary | ICD-10-CM | POA: Diagnosis not present

## 2020-02-22 DIAGNOSIS — M4807 Spinal stenosis, lumbosacral region: Secondary | ICD-10-CM | POA: Diagnosis not present

## 2020-02-22 DIAGNOSIS — D631 Anemia in chronic kidney disease: Secondary | ICD-10-CM | POA: Diagnosis not present

## 2020-02-22 DIAGNOSIS — Z923 Personal history of irradiation: Secondary | ICD-10-CM | POA: Diagnosis not present

## 2020-02-22 DIAGNOSIS — S2222XA Fracture of body of sternum, initial encounter for closed fracture: Secondary | ICD-10-CM | POA: Diagnosis not present

## 2020-02-22 DIAGNOSIS — T17908S Unspecified foreign body in respiratory tract, part unspecified causing other injury, sequela: Secondary | ICD-10-CM | POA: Diagnosis not present

## 2020-02-22 DIAGNOSIS — H25013 Cortical age-related cataract, bilateral: Secondary | ICD-10-CM | POA: Diagnosis not present

## 2020-02-22 DIAGNOSIS — G4730 Sleep apnea, unspecified: Secondary | ICD-10-CM | POA: Diagnosis not present

## 2020-02-22 DIAGNOSIS — E519 Thiamine deficiency, unspecified: Secondary | ICD-10-CM | POA: Diagnosis not present

## 2020-02-22 DIAGNOSIS — F329 Major depressive disorder, single episode, unspecified: Secondary | ICD-10-CM | POA: Diagnosis not present

## 2020-02-22 DIAGNOSIS — K5792 Diverticulitis of intestine, part unspecified, without perforation or abscess without bleeding: Secondary | ICD-10-CM | POA: Diagnosis not present

## 2020-02-22 DIAGNOSIS — Z09 Encounter for follow-up examination after completed treatment for conditions other than malignant neoplasm: Secondary | ICD-10-CM | POA: Diagnosis not present

## 2020-02-22 DIAGNOSIS — M1990 Unspecified osteoarthritis, unspecified site: Secondary | ICD-10-CM | POA: Diagnosis not present

## 2020-02-22 DIAGNOSIS — N3091 Cystitis, unspecified with hematuria: Secondary | ICD-10-CM | POA: Diagnosis not present

## 2020-02-22 DIAGNOSIS — B9689 Other specified bacterial agents as the cause of diseases classified elsewhere: Secondary | ICD-10-CM | POA: Diagnosis not present

## 2020-02-22 DIAGNOSIS — I69398 Other sequelae of cerebral infarction: Secondary | ICD-10-CM | POA: Diagnosis not present

## 2020-02-22 DIAGNOSIS — Z8616 Personal history of COVID-19: Secondary | ICD-10-CM | POA: Diagnosis not present

## 2020-02-22 DIAGNOSIS — M7752 Other enthesopathy of left foot: Secondary | ICD-10-CM | POA: Diagnosis not present

## 2020-02-22 DIAGNOSIS — Z1329 Encounter for screening for other suspected endocrine disorder: Secondary | ICD-10-CM | POA: Diagnosis not present

## 2020-02-22 DIAGNOSIS — Z9861 Coronary angioplasty status: Secondary | ICD-10-CM | POA: Diagnosis not present

## 2020-02-22 DIAGNOSIS — L03032 Cellulitis of left toe: Secondary | ICD-10-CM | POA: Diagnosis not present

## 2020-02-22 DIAGNOSIS — F17210 Nicotine dependence, cigarettes, uncomplicated: Secondary | ICD-10-CM | POA: Diagnosis not present

## 2020-02-22 DIAGNOSIS — H409 Unspecified glaucoma: Secondary | ICD-10-CM | POA: Diagnosis not present

## 2020-02-22 DIAGNOSIS — E1142 Type 2 diabetes mellitus with diabetic polyneuropathy: Secondary | ICD-10-CM | POA: Diagnosis not present

## 2020-02-22 DIAGNOSIS — R9341 Abnormal radiologic findings on diagnostic imaging of renal pelvis, ureter, or bladder: Secondary | ICD-10-CM | POA: Diagnosis not present

## 2020-02-22 DIAGNOSIS — C8590 Non-Hodgkin lymphoma, unspecified, unspecified site: Secondary | ICD-10-CM | POA: Diagnosis not present

## 2020-02-22 DIAGNOSIS — H0102A Squamous blepharitis right eye, upper and lower eyelids: Secondary | ICD-10-CM | POA: Diagnosis not present

## 2020-02-22 DIAGNOSIS — I890 Lymphedema, not elsewhere classified: Secondary | ICD-10-CM | POA: Diagnosis not present

## 2020-02-22 DIAGNOSIS — Z6831 Body mass index (BMI) 31.0-31.9, adult: Secondary | ICD-10-CM | POA: Diagnosis not present

## 2020-02-22 DIAGNOSIS — Z471 Aftercare following joint replacement surgery: Secondary | ICD-10-CM | POA: Diagnosis not present

## 2020-02-22 DIAGNOSIS — Z6826 Body mass index (BMI) 26.0-26.9, adult: Secondary | ICD-10-CM | POA: Diagnosis not present

## 2020-02-22 DIAGNOSIS — D72829 Elevated white blood cell count, unspecified: Secondary | ICD-10-CM | POA: Diagnosis not present

## 2020-02-22 DIAGNOSIS — F411 Generalized anxiety disorder: Secondary | ICD-10-CM | POA: Diagnosis not present

## 2020-02-22 DIAGNOSIS — D120 Benign neoplasm of cecum: Secondary | ICD-10-CM | POA: Diagnosis not present

## 2020-02-22 DIAGNOSIS — I2722 Pulmonary hypertension due to left heart disease: Secondary | ICD-10-CM | POA: Diagnosis not present

## 2020-02-22 DIAGNOSIS — Z5181 Encounter for therapeutic drug level monitoring: Secondary | ICD-10-CM | POA: Diagnosis not present

## 2020-02-22 DIAGNOSIS — Z1283 Encounter for screening for malignant neoplasm of skin: Secondary | ICD-10-CM | POA: Diagnosis not present

## 2020-02-22 DIAGNOSIS — D124 Benign neoplasm of descending colon: Secondary | ICD-10-CM | POA: Diagnosis not present

## 2020-02-22 DIAGNOSIS — I440 Atrioventricular block, first degree: Secondary | ICD-10-CM | POA: Diagnosis not present

## 2020-02-22 DIAGNOSIS — K31819 Angiodysplasia of stomach and duodenum without bleeding: Secondary | ICD-10-CM | POA: Diagnosis not present

## 2020-02-22 DIAGNOSIS — L97919 Non-pressure chronic ulcer of unspecified part of right lower leg with unspecified severity: Secondary | ICD-10-CM | POA: Diagnosis not present

## 2020-02-22 DIAGNOSIS — Z13820 Encounter for screening for osteoporosis: Secondary | ICD-10-CM | POA: Diagnosis not present

## 2020-02-22 DIAGNOSIS — J3489 Other specified disorders of nose and nasal sinuses: Secondary | ICD-10-CM | POA: Diagnosis not present

## 2020-02-22 DIAGNOSIS — H3554 Dystrophies primarily involving the retinal pigment epithelium: Secondary | ICD-10-CM | POA: Diagnosis not present

## 2020-02-22 DIAGNOSIS — M62551 Muscle wasting and atrophy, not elsewhere classified, right thigh: Secondary | ICD-10-CM | POA: Diagnosis not present

## 2020-02-22 DIAGNOSIS — Z451 Encounter for adjustment and management of infusion pump: Secondary | ICD-10-CM | POA: Diagnosis not present

## 2020-02-22 DIAGNOSIS — F5089 Other specified eating disorder: Secondary | ICD-10-CM | POA: Diagnosis not present

## 2020-02-22 DIAGNOSIS — Z992 Dependence on renal dialysis: Secondary | ICD-10-CM | POA: Diagnosis not present

## 2020-02-22 DIAGNOSIS — M5416 Radiculopathy, lumbar region: Secondary | ICD-10-CM | POA: Diagnosis not present

## 2020-02-22 DIAGNOSIS — J4550 Severe persistent asthma, uncomplicated: Secondary | ICD-10-CM | POA: Diagnosis not present

## 2020-02-22 DIAGNOSIS — H18593 Other hereditary corneal dystrophies, bilateral: Secondary | ICD-10-CM | POA: Diagnosis not present

## 2020-02-22 DIAGNOSIS — M159 Polyosteoarthritis, unspecified: Secondary | ICD-10-CM | POA: Diagnosis not present

## 2020-02-22 DIAGNOSIS — L570 Actinic keratosis: Secondary | ICD-10-CM | POA: Diagnosis not present

## 2020-02-22 DIAGNOSIS — K8020 Calculus of gallbladder without cholecystitis without obstruction: Secondary | ICD-10-CM | POA: Diagnosis not present

## 2020-02-22 DIAGNOSIS — W57XXXA Bitten or stung by nonvenomous insect and other nonvenomous arthropods, initial encounter: Secondary | ICD-10-CM | POA: Diagnosis not present

## 2020-02-22 DIAGNOSIS — Z800 Family history of malignant neoplasm of digestive organs: Secondary | ICD-10-CM | POA: Diagnosis not present

## 2020-02-22 DIAGNOSIS — R151 Fecal smearing: Secondary | ICD-10-CM | POA: Diagnosis not present

## 2020-02-22 DIAGNOSIS — J4530 Mild persistent asthma, uncomplicated: Secondary | ICD-10-CM | POA: Diagnosis not present

## 2020-02-22 DIAGNOSIS — I7101 Dissection of thoracic aorta: Secondary | ICD-10-CM | POA: Diagnosis not present

## 2020-02-22 DIAGNOSIS — L309 Dermatitis, unspecified: Secondary | ICD-10-CM | POA: Diagnosis not present

## 2020-02-22 DIAGNOSIS — N2889 Other specified disorders of kidney and ureter: Secondary | ICD-10-CM | POA: Diagnosis not present

## 2020-02-22 DIAGNOSIS — Z6829 Body mass index (BMI) 29.0-29.9, adult: Secondary | ICD-10-CM | POA: Diagnosis not present

## 2020-02-22 DIAGNOSIS — C328 Malignant neoplasm of overlapping sites of larynx: Secondary | ICD-10-CM | POA: Diagnosis not present

## 2020-02-22 DIAGNOSIS — N390 Urinary tract infection, site not specified: Secondary | ICD-10-CM | POA: Diagnosis not present

## 2020-02-22 DIAGNOSIS — I472 Ventricular tachycardia: Secondary | ICD-10-CM | POA: Diagnosis not present

## 2020-02-22 DIAGNOSIS — Z79891 Long term (current) use of opiate analgesic: Secondary | ICD-10-CM | POA: Diagnosis not present

## 2020-02-22 DIAGNOSIS — M2032 Hallux varus (acquired), left foot: Secondary | ICD-10-CM | POA: Diagnosis not present

## 2020-02-22 DIAGNOSIS — Z6833 Body mass index (BMI) 33.0-33.9, adult: Secondary | ICD-10-CM | POA: Diagnosis not present

## 2020-02-22 DIAGNOSIS — Z1159 Encounter for screening for other viral diseases: Secondary | ICD-10-CM | POA: Diagnosis not present

## 2020-02-22 DIAGNOSIS — R41841 Cognitive communication deficit: Secondary | ICD-10-CM | POA: Diagnosis not present

## 2020-02-22 DIAGNOSIS — Z6836 Body mass index (BMI) 36.0-36.9, adult: Secondary | ICD-10-CM | POA: Diagnosis not present

## 2020-02-22 DIAGNOSIS — E1159 Type 2 diabetes mellitus with other circulatory complications: Secondary | ICD-10-CM | POA: Diagnosis not present

## 2020-02-22 DIAGNOSIS — B0052 Herpesviral keratitis: Secondary | ICD-10-CM | POA: Diagnosis not present

## 2020-02-22 DIAGNOSIS — L219 Seborrheic dermatitis, unspecified: Secondary | ICD-10-CM | POA: Diagnosis not present

## 2020-02-22 DIAGNOSIS — M654 Radial styloid tenosynovitis [de Quervain]: Secondary | ICD-10-CM | POA: Diagnosis not present

## 2020-02-22 DIAGNOSIS — S80811A Abrasion, right lower leg, initial encounter: Secondary | ICD-10-CM | POA: Diagnosis not present

## 2020-02-22 DIAGNOSIS — I6310 Cerebral infarction due to embolism of unspecified precerebral artery: Secondary | ICD-10-CM | POA: Diagnosis not present

## 2020-02-22 DIAGNOSIS — Z01818 Encounter for other preprocedural examination: Secondary | ICD-10-CM | POA: Diagnosis not present

## 2020-02-22 DIAGNOSIS — Z48813 Encounter for surgical aftercare following surgery on the respiratory system: Secondary | ICD-10-CM | POA: Diagnosis not present

## 2020-02-22 DIAGNOSIS — D751 Secondary polycythemia: Secondary | ICD-10-CM | POA: Diagnosis not present

## 2020-02-22 DIAGNOSIS — M7542 Impingement syndrome of left shoulder: Secondary | ICD-10-CM | POA: Diagnosis not present

## 2020-02-22 DIAGNOSIS — I6523 Occlusion and stenosis of bilateral carotid arteries: Secondary | ICD-10-CM | POA: Diagnosis not present

## 2020-02-22 DIAGNOSIS — N138 Other obstructive and reflux uropathy: Secondary | ICD-10-CM | POA: Diagnosis not present

## 2020-02-22 DIAGNOSIS — I7300 Raynaud's syndrome without gangrene: Secondary | ICD-10-CM | POA: Diagnosis not present

## 2020-02-22 DIAGNOSIS — M25662 Stiffness of left knee, not elsewhere classified: Secondary | ICD-10-CM | POA: Diagnosis not present

## 2020-02-22 DIAGNOSIS — S46811A Strain of other muscles, fascia and tendons at shoulder and upper arm level, right arm, initial encounter: Secondary | ICD-10-CM | POA: Diagnosis not present

## 2020-02-22 DIAGNOSIS — M461 Sacroiliitis, not elsewhere classified: Secondary | ICD-10-CM | POA: Diagnosis not present

## 2020-02-22 DIAGNOSIS — I10 Essential (primary) hypertension: Secondary | ICD-10-CM | POA: Diagnosis not present

## 2020-02-22 DIAGNOSIS — M25472 Effusion, left ankle: Secondary | ICD-10-CM | POA: Diagnosis not present

## 2020-02-22 DIAGNOSIS — M19042 Primary osteoarthritis, left hand: Secondary | ICD-10-CM | POA: Diagnosis not present

## 2020-02-22 DIAGNOSIS — Z8249 Family history of ischemic heart disease and other diseases of the circulatory system: Secondary | ICD-10-CM | POA: Diagnosis not present

## 2020-02-22 DIAGNOSIS — Z780 Asymptomatic menopausal state: Secondary | ICD-10-CM | POA: Diagnosis not present

## 2020-02-22 DIAGNOSIS — M62830 Muscle spasm of back: Secondary | ICD-10-CM | POA: Diagnosis not present

## 2020-02-22 DIAGNOSIS — G8928 Other chronic postprocedural pain: Secondary | ICD-10-CM | POA: Diagnosis not present

## 2020-02-22 DIAGNOSIS — M25411 Effusion, right shoulder: Secondary | ICD-10-CM | POA: Diagnosis not present

## 2020-02-22 DIAGNOSIS — I671 Cerebral aneurysm, nonruptured: Secondary | ICD-10-CM | POA: Diagnosis not present

## 2020-02-22 DIAGNOSIS — I480 Paroxysmal atrial fibrillation: Secondary | ICD-10-CM | POA: Diagnosis not present

## 2020-02-22 DIAGNOSIS — M47816 Spondylosis without myelopathy or radiculopathy, lumbar region: Secondary | ICD-10-CM | POA: Diagnosis not present

## 2020-02-22 DIAGNOSIS — L292 Pruritus vulvae: Secondary | ICD-10-CM | POA: Diagnosis not present

## 2020-02-22 DIAGNOSIS — Z978 Presence of other specified devices: Secondary | ICD-10-CM | POA: Diagnosis not present

## 2020-02-22 DIAGNOSIS — M19041 Primary osteoarthritis, right hand: Secondary | ICD-10-CM | POA: Diagnosis not present

## 2020-02-22 DIAGNOSIS — L400 Psoriasis vulgaris: Secondary | ICD-10-CM | POA: Diagnosis not present

## 2020-02-22 DIAGNOSIS — Z1321 Encounter for screening for nutritional disorder: Secondary | ICD-10-CM | POA: Diagnosis not present

## 2020-02-22 DIAGNOSIS — L97312 Non-pressure chronic ulcer of right ankle with fat layer exposed: Secondary | ICD-10-CM | POA: Diagnosis not present

## 2020-02-22 DIAGNOSIS — Q8501 Neurofibromatosis, type 1: Secondary | ICD-10-CM | POA: Diagnosis not present

## 2020-02-22 DIAGNOSIS — D126 Benign neoplasm of colon, unspecified: Secondary | ICD-10-CM | POA: Diagnosis not present

## 2020-02-22 DIAGNOSIS — M5134 Other intervertebral disc degeneration, thoracic region: Secondary | ICD-10-CM | POA: Diagnosis not present

## 2020-02-22 DIAGNOSIS — L989 Disorder of the skin and subcutaneous tissue, unspecified: Secondary | ICD-10-CM | POA: Diagnosis not present

## 2020-02-22 DIAGNOSIS — H353111 Nonexudative age-related macular degeneration, right eye, early dry stage: Secondary | ICD-10-CM | POA: Diagnosis not present

## 2020-02-22 DIAGNOSIS — M7122 Synovial cyst of popliteal space [Baker], left knee: Secondary | ICD-10-CM | POA: Diagnosis not present

## 2020-02-22 DIAGNOSIS — K922 Gastrointestinal hemorrhage, unspecified: Secondary | ICD-10-CM | POA: Diagnosis not present

## 2020-02-22 DIAGNOSIS — S0033XA Contusion of nose, initial encounter: Secondary | ICD-10-CM | POA: Diagnosis not present

## 2020-02-22 DIAGNOSIS — E559 Vitamin D deficiency, unspecified: Secondary | ICD-10-CM | POA: Diagnosis not present

## 2020-02-22 DIAGNOSIS — I120 Hypertensive chronic kidney disease with stage 5 chronic kidney disease or end stage renal disease: Secondary | ICD-10-CM | POA: Diagnosis not present

## 2020-02-22 DIAGNOSIS — H0100A Unspecified blepharitis right eye, upper and lower eyelids: Secondary | ICD-10-CM | POA: Diagnosis not present

## 2020-02-22 DIAGNOSIS — M25661 Stiffness of right knee, not elsewhere classified: Secondary | ICD-10-CM | POA: Diagnosis not present

## 2020-02-22 DIAGNOSIS — C069 Malignant neoplasm of mouth, unspecified: Secondary | ICD-10-CM | POA: Diagnosis not present

## 2020-02-22 DIAGNOSIS — Q600 Renal agenesis, unilateral: Secondary | ICD-10-CM | POA: Diagnosis not present

## 2020-02-22 DIAGNOSIS — N200 Calculus of kidney: Secondary | ICD-10-CM | POA: Diagnosis not present

## 2020-02-22 DIAGNOSIS — M2550 Pain in unspecified joint: Secondary | ICD-10-CM | POA: Diagnosis not present

## 2020-02-22 DIAGNOSIS — N3020 Other chronic cystitis without hematuria: Secondary | ICD-10-CM | POA: Diagnosis not present

## 2020-02-22 DIAGNOSIS — M2042 Other hammer toe(s) (acquired), left foot: Secondary | ICD-10-CM | POA: Diagnosis not present

## 2020-02-22 DIAGNOSIS — R82998 Other abnormal findings in urine: Secondary | ICD-10-CM | POA: Diagnosis not present

## 2020-02-22 DIAGNOSIS — H6123 Impacted cerumen, bilateral: Secondary | ICD-10-CM | POA: Diagnosis not present

## 2020-02-22 DIAGNOSIS — J439 Emphysema, unspecified: Secondary | ICD-10-CM | POA: Diagnosis not present

## 2020-02-22 DIAGNOSIS — D224 Melanocytic nevi of scalp and neck: Secondary | ICD-10-CM | POA: Diagnosis not present

## 2020-02-22 DIAGNOSIS — L509 Urticaria, unspecified: Secondary | ICD-10-CM | POA: Diagnosis not present

## 2020-02-22 DIAGNOSIS — R7303 Prediabetes: Secondary | ICD-10-CM | POA: Diagnosis not present

## 2020-02-22 DIAGNOSIS — J019 Acute sinusitis, unspecified: Secondary | ICD-10-CM | POA: Diagnosis not present

## 2020-02-22 DIAGNOSIS — Z01812 Encounter for preprocedural laboratory examination: Secondary | ICD-10-CM | POA: Diagnosis not present

## 2020-02-22 DIAGNOSIS — I428 Other cardiomyopathies: Secondary | ICD-10-CM | POA: Diagnosis not present

## 2020-02-22 DIAGNOSIS — D519 Vitamin B12 deficiency anemia, unspecified: Secondary | ICD-10-CM | POA: Diagnosis not present

## 2020-02-22 DIAGNOSIS — N183 Chronic kidney disease, stage 3 unspecified: Secondary | ICD-10-CM | POA: Diagnosis not present

## 2020-02-22 DIAGNOSIS — I82492 Acute embolism and thrombosis of other specified deep vein of left lower extremity: Secondary | ICD-10-CM | POA: Diagnosis not present

## 2020-02-22 DIAGNOSIS — R0781 Pleurodynia: Secondary | ICD-10-CM | POA: Diagnosis not present

## 2020-02-22 DIAGNOSIS — H35361 Drusen (degenerative) of macula, right eye: Secondary | ICD-10-CM | POA: Diagnosis not present

## 2020-02-22 DIAGNOSIS — M6702 Short Achilles tendon (acquired), left ankle: Secondary | ICD-10-CM | POA: Diagnosis not present

## 2020-02-22 DIAGNOSIS — Q180 Sinus, fistula and cyst of branchial cleft: Secondary | ICD-10-CM | POA: Diagnosis not present

## 2020-02-22 DIAGNOSIS — F17211 Nicotine dependence, cigarettes, in remission: Secondary | ICD-10-CM | POA: Diagnosis not present

## 2020-02-22 DIAGNOSIS — E1165 Type 2 diabetes mellitus with hyperglycemia: Secondary | ICD-10-CM | POA: Diagnosis not present

## 2020-02-22 DIAGNOSIS — D2272 Melanocytic nevi of left lower limb, including hip: Secondary | ICD-10-CM | POA: Diagnosis not present

## 2020-02-22 DIAGNOSIS — M542 Cervicalgia: Secondary | ICD-10-CM | POA: Diagnosis not present

## 2020-02-22 DIAGNOSIS — J1282 Pneumonia due to coronavirus disease 2019: Secondary | ICD-10-CM | POA: Diagnosis not present

## 2020-02-22 DIAGNOSIS — Z760 Encounter for issue of repeat prescription: Secondary | ICD-10-CM | POA: Diagnosis not present

## 2020-02-22 DIAGNOSIS — R279 Unspecified lack of coordination: Secondary | ICD-10-CM | POA: Diagnosis not present

## 2020-02-22 DIAGNOSIS — C8300 Small cell B-cell lymphoma, unspecified site: Secondary | ICD-10-CM | POA: Diagnosis not present

## 2020-02-22 DIAGNOSIS — C770 Secondary and unspecified malignant neoplasm of lymph nodes of head, face and neck: Secondary | ICD-10-CM | POA: Diagnosis not present

## 2020-02-22 DIAGNOSIS — Z125 Encounter for screening for malignant neoplasm of prostate: Secondary | ICD-10-CM | POA: Diagnosis not present

## 2020-02-22 DIAGNOSIS — M4310 Spondylolisthesis, site unspecified: Secondary | ICD-10-CM | POA: Diagnosis not present

## 2020-02-22 DIAGNOSIS — L03031 Cellulitis of right toe: Secondary | ICD-10-CM | POA: Diagnosis not present

## 2020-02-22 DIAGNOSIS — R270 Ataxia, unspecified: Secondary | ICD-10-CM | POA: Diagnosis not present

## 2020-02-22 DIAGNOSIS — N3289 Other specified disorders of bladder: Secondary | ICD-10-CM | POA: Diagnosis not present

## 2020-02-22 DIAGNOSIS — T50905A Adverse effect of unspecified drugs, medicaments and biological substances, initial encounter: Secondary | ICD-10-CM | POA: Diagnosis not present

## 2020-02-22 DIAGNOSIS — M50123 Cervical disc disorder at C6-C7 level with radiculopathy: Secondary | ICD-10-CM | POA: Diagnosis not present

## 2020-02-22 DIAGNOSIS — Z1339 Encounter for screening examination for other mental health and behavioral disorders: Secondary | ICD-10-CM | POA: Diagnosis not present

## 2020-02-22 DIAGNOSIS — M41127 Adolescent idiopathic scoliosis, lumbosacral region: Secondary | ICD-10-CM | POA: Diagnosis not present

## 2020-02-22 DIAGNOSIS — Z79899 Other long term (current) drug therapy: Secondary | ICD-10-CM | POA: Diagnosis not present

## 2020-02-22 DIAGNOSIS — G629 Polyneuropathy, unspecified: Secondary | ICD-10-CM | POA: Diagnosis not present

## 2020-02-22 DIAGNOSIS — J342 Deviated nasal septum: Secondary | ICD-10-CM | POA: Diagnosis not present

## 2020-02-22 DIAGNOSIS — R404 Transient alteration of awareness: Secondary | ICD-10-CM | POA: Diagnosis not present

## 2020-02-22 DIAGNOSIS — Z1331 Encounter for screening for depression: Secondary | ICD-10-CM | POA: Diagnosis not present

## 2020-02-22 DIAGNOSIS — Z136 Encounter for screening for cardiovascular disorders: Secondary | ICD-10-CM | POA: Diagnosis not present

## 2020-02-22 DIAGNOSIS — N951 Menopausal and female climacteric states: Secondary | ICD-10-CM | POA: Diagnosis not present

## 2020-02-22 DIAGNOSIS — R29818 Other symptoms and signs involving the nervous system: Secondary | ICD-10-CM | POA: Diagnosis not present

## 2020-02-22 DIAGNOSIS — H905 Unspecified sensorineural hearing loss: Secondary | ICD-10-CM | POA: Diagnosis not present

## 2020-02-22 DIAGNOSIS — I5043 Acute on chronic combined systolic (congestive) and diastolic (congestive) heart failure: Secondary | ICD-10-CM | POA: Diagnosis not present

## 2020-02-22 DIAGNOSIS — K5720 Diverticulitis of large intestine with perforation and abscess without bleeding: Secondary | ICD-10-CM | POA: Diagnosis not present

## 2020-02-22 DIAGNOSIS — S81012D Laceration without foreign body, left knee, subsequent encounter: Secondary | ICD-10-CM | POA: Diagnosis not present

## 2020-02-22 DIAGNOSIS — N949 Unspecified condition associated with female genital organs and menstrual cycle: Secondary | ICD-10-CM | POA: Diagnosis not present

## 2020-02-22 DIAGNOSIS — Z980 Intestinal bypass and anastomosis status: Secondary | ICD-10-CM | POA: Diagnosis not present

## 2020-02-22 DIAGNOSIS — J329 Chronic sinusitis, unspecified: Secondary | ICD-10-CM | POA: Diagnosis not present

## 2020-02-22 DIAGNOSIS — S8261XA Displaced fracture of lateral malleolus of right fibula, initial encounter for closed fracture: Secondary | ICD-10-CM | POA: Diagnosis not present

## 2020-02-22 DIAGNOSIS — Z96612 Presence of left artificial shoulder joint: Secondary | ICD-10-CM | POA: Diagnosis not present

## 2020-02-22 DIAGNOSIS — I429 Cardiomyopathy, unspecified: Secondary | ICD-10-CM | POA: Diagnosis not present

## 2020-02-22 DIAGNOSIS — Z48812 Encounter for surgical aftercare following surgery on the circulatory system: Secondary | ICD-10-CM | POA: Diagnosis not present

## 2020-02-22 DIAGNOSIS — C50212 Malignant neoplasm of upper-inner quadrant of left female breast: Secondary | ICD-10-CM | POA: Diagnosis not present

## 2020-02-22 DIAGNOSIS — L578 Other skin changes due to chronic exposure to nonionizing radiation: Secondary | ICD-10-CM | POA: Diagnosis not present

## 2020-02-22 DIAGNOSIS — M0589 Other rheumatoid arthritis with rheumatoid factor of multiple sites: Secondary | ICD-10-CM | POA: Diagnosis not present

## 2020-02-22 DIAGNOSIS — H34812 Central retinal vein occlusion, left eye, with macular edema: Secondary | ICD-10-CM | POA: Diagnosis not present

## 2020-02-22 DIAGNOSIS — I5032 Chronic diastolic (congestive) heart failure: Secondary | ICD-10-CM | POA: Diagnosis not present

## 2020-02-22 DIAGNOSIS — Z955 Presence of coronary angioplasty implant and graft: Secondary | ICD-10-CM | POA: Diagnosis not present

## 2020-02-22 DIAGNOSIS — I4510 Unspecified right bundle-branch block: Secondary | ICD-10-CM | POA: Diagnosis not present

## 2020-02-22 DIAGNOSIS — C9001 Multiple myeloma in remission: Secondary | ICD-10-CM | POA: Diagnosis not present

## 2020-02-22 DIAGNOSIS — M25672 Stiffness of left ankle, not elsewhere classified: Secondary | ICD-10-CM | POA: Diagnosis not present

## 2020-02-22 DIAGNOSIS — F5101 Primary insomnia: Secondary | ICD-10-CM | POA: Diagnosis not present

## 2020-02-22 DIAGNOSIS — M4306 Spondylolysis, lumbar region: Secondary | ICD-10-CM | POA: Diagnosis not present

## 2020-02-22 DIAGNOSIS — S22000A Wedge compression fracture of unspecified thoracic vertebra, initial encounter for closed fracture: Secondary | ICD-10-CM | POA: Diagnosis not present

## 2020-02-22 DIAGNOSIS — H60339 Swimmer's ear, unspecified ear: Secondary | ICD-10-CM | POA: Diagnosis not present

## 2020-02-22 DIAGNOSIS — M7121 Synovial cyst of popliteal space [Baker], right knee: Secondary | ICD-10-CM | POA: Diagnosis not present

## 2020-02-22 DIAGNOSIS — L719 Rosacea, unspecified: Secondary | ICD-10-CM | POA: Diagnosis not present

## 2020-02-22 DIAGNOSIS — H52202 Unspecified astigmatism, left eye: Secondary | ICD-10-CM | POA: Diagnosis not present

## 2020-02-22 DIAGNOSIS — Z952 Presence of prosthetic heart valve: Secondary | ICD-10-CM | POA: Diagnosis not present

## 2020-02-22 DIAGNOSIS — C50912 Malignant neoplasm of unspecified site of left female breast: Secondary | ICD-10-CM | POA: Diagnosis not present

## 2020-02-22 DIAGNOSIS — S52502A Unspecified fracture of the lower end of left radius, initial encounter for closed fracture: Secondary | ICD-10-CM | POA: Diagnosis not present

## 2020-02-22 DIAGNOSIS — H43811 Vitreous degeneration, right eye: Secondary | ICD-10-CM | POA: Diagnosis not present

## 2020-02-22 DIAGNOSIS — K580 Irritable bowel syndrome with diarrhea: Secondary | ICD-10-CM | POA: Diagnosis not present

## 2020-02-22 DIAGNOSIS — M5126 Other intervertebral disc displacement, lumbar region: Secondary | ICD-10-CM | POA: Diagnosis not present

## 2020-02-22 DIAGNOSIS — M5090 Cervical disc disorder, unspecified, unspecified cervical region: Secondary | ICD-10-CM | POA: Diagnosis not present

## 2020-02-22 DIAGNOSIS — N210 Calculus in bladder: Secondary | ICD-10-CM | POA: Diagnosis not present

## 2020-02-22 DIAGNOSIS — Z7982 Long term (current) use of aspirin: Secondary | ICD-10-CM | POA: Diagnosis not present

## 2020-02-22 DIAGNOSIS — H44111 Panuveitis, right eye: Secondary | ICD-10-CM | POA: Diagnosis not present

## 2020-02-22 DIAGNOSIS — M1A0790 Idiopathic chronic gout, unspecified ankle and foot, without tophus (tophi): Secondary | ICD-10-CM | POA: Diagnosis not present

## 2020-02-22 DIAGNOSIS — R609 Edema, unspecified: Secondary | ICD-10-CM | POA: Diagnosis not present

## 2020-02-22 DIAGNOSIS — E113293 Type 2 diabetes mellitus with mild nonproliferative diabetic retinopathy without macular edema, bilateral: Secondary | ICD-10-CM | POA: Diagnosis not present

## 2020-02-22 DIAGNOSIS — H5340 Unspecified visual field defects: Secondary | ICD-10-CM | POA: Diagnosis not present

## 2020-02-22 DIAGNOSIS — H903 Sensorineural hearing loss, bilateral: Secondary | ICD-10-CM | POA: Diagnosis not present

## 2020-02-22 DIAGNOSIS — S90812A Abrasion, left foot, initial encounter: Secondary | ICD-10-CM | POA: Diagnosis not present

## 2020-02-22 DIAGNOSIS — M79672 Pain in left foot: Secondary | ICD-10-CM | POA: Diagnosis not present

## 2020-02-22 DIAGNOSIS — S7291XA Unspecified fracture of right femur, initial encounter for closed fracture: Secondary | ICD-10-CM | POA: Diagnosis not present

## 2020-02-22 DIAGNOSIS — I951 Orthostatic hypotension: Secondary | ICD-10-CM | POA: Diagnosis not present

## 2020-02-22 DIAGNOSIS — F0281 Dementia in other diseases classified elsewhere with behavioral disturbance: Secondary | ICD-10-CM | POA: Diagnosis not present

## 2020-02-22 DIAGNOSIS — H25011 Cortical age-related cataract, right eye: Secondary | ICD-10-CM | POA: Diagnosis not present

## 2020-02-22 DIAGNOSIS — H18599 Other hereditary corneal dystrophies, unspecified eye: Secondary | ICD-10-CM | POA: Diagnosis not present

## 2020-02-22 DIAGNOSIS — M278 Other specified diseases of jaws: Secondary | ICD-10-CM | POA: Diagnosis not present

## 2020-02-22 DIAGNOSIS — Z4689 Encounter for fitting and adjustment of other specified devices: Secondary | ICD-10-CM | POA: Diagnosis not present

## 2020-02-22 DIAGNOSIS — M79604 Pain in right leg: Secondary | ICD-10-CM | POA: Diagnosis not present

## 2020-02-22 DIAGNOSIS — G5690 Unspecified mononeuropathy of unspecified upper limb: Secondary | ICD-10-CM | POA: Diagnosis not present

## 2020-02-22 DIAGNOSIS — J208 Acute bronchitis due to other specified organisms: Secondary | ICD-10-CM | POA: Diagnosis not present

## 2020-02-22 DIAGNOSIS — D473 Essential (hemorrhagic) thrombocythemia: Secondary | ICD-10-CM | POA: Diagnosis not present

## 2020-02-22 DIAGNOSIS — G500 Trigeminal neuralgia: Secondary | ICD-10-CM | POA: Diagnosis not present

## 2020-02-22 DIAGNOSIS — S233XXA Sprain of ligaments of thoracic spine, initial encounter: Secondary | ICD-10-CM | POA: Diagnosis not present

## 2020-02-22 DIAGNOSIS — C679 Malignant neoplasm of bladder, unspecified: Secondary | ICD-10-CM | POA: Diagnosis not present

## 2020-02-22 DIAGNOSIS — M4810 Ankylosing hyperostosis [Forestier], site unspecified: Secondary | ICD-10-CM | POA: Diagnosis not present

## 2020-02-22 DIAGNOSIS — I272 Pulmonary hypertension, unspecified: Secondary | ICD-10-CM | POA: Diagnosis not present

## 2020-02-22 DIAGNOSIS — N400 Enlarged prostate without lower urinary tract symptoms: Secondary | ICD-10-CM | POA: Diagnosis not present

## 2020-02-22 DIAGNOSIS — H35362 Drusen (degenerative) of macula, left eye: Secondary | ICD-10-CM | POA: Diagnosis not present

## 2020-02-22 DIAGNOSIS — M7541 Impingement syndrome of right shoulder: Secondary | ICD-10-CM | POA: Diagnosis not present

## 2020-02-22 DIAGNOSIS — M25561 Pain in right knee: Secondary | ICD-10-CM | POA: Diagnosis not present

## 2020-02-22 DIAGNOSIS — H40052 Ocular hypertension, left eye: Secondary | ICD-10-CM | POA: Diagnosis not present

## 2020-02-22 DIAGNOSIS — K5000 Crohn's disease of small intestine without complications: Secondary | ICD-10-CM | POA: Diagnosis not present

## 2020-02-22 DIAGNOSIS — Z08 Encounter for follow-up examination after completed treatment for malignant neoplasm: Secondary | ICD-10-CM | POA: Diagnosis not present

## 2020-02-22 DIAGNOSIS — R413 Other amnesia: Secondary | ICD-10-CM | POA: Diagnosis not present

## 2020-02-22 DIAGNOSIS — Z6841 Body Mass Index (BMI) 40.0 and over, adult: Secondary | ICD-10-CM | POA: Diagnosis not present

## 2020-02-22 DIAGNOSIS — L304 Erythema intertrigo: Secondary | ICD-10-CM | POA: Diagnosis not present

## 2020-02-22 DIAGNOSIS — M539 Dorsopathy, unspecified: Secondary | ICD-10-CM | POA: Diagnosis not present

## 2020-02-22 DIAGNOSIS — C4491 Basal cell carcinoma of skin, unspecified: Secondary | ICD-10-CM | POA: Diagnosis not present

## 2020-02-22 DIAGNOSIS — C3431 Malignant neoplasm of lower lobe, right bronchus or lung: Secondary | ICD-10-CM | POA: Diagnosis not present

## 2020-02-22 DIAGNOSIS — R635 Abnormal weight gain: Secondary | ICD-10-CM | POA: Diagnosis not present

## 2020-02-22 DIAGNOSIS — Z209 Contact with and (suspected) exposure to unspecified communicable disease: Secondary | ICD-10-CM | POA: Diagnosis not present

## 2020-02-22 DIAGNOSIS — H9193 Unspecified hearing loss, bilateral: Secondary | ICD-10-CM | POA: Diagnosis not present

## 2020-02-22 DIAGNOSIS — M5137 Other intervertebral disc degeneration, lumbosacral region: Secondary | ICD-10-CM | POA: Diagnosis not present

## 2020-02-22 DIAGNOSIS — F99 Mental disorder, not otherwise specified: Secondary | ICD-10-CM | POA: Diagnosis not present

## 2020-02-22 DIAGNOSIS — F4322 Adjustment disorder with anxiety: Secondary | ICD-10-CM | POA: Diagnosis not present

## 2020-02-22 DIAGNOSIS — K5130 Ulcerative (chronic) rectosigmoiditis without complications: Secondary | ICD-10-CM | POA: Diagnosis not present

## 2020-02-22 DIAGNOSIS — E861 Hypovolemia: Secondary | ICD-10-CM | POA: Diagnosis not present

## 2020-02-22 DIAGNOSIS — M1712 Unilateral primary osteoarthritis, left knee: Secondary | ICD-10-CM | POA: Diagnosis not present

## 2020-02-22 DIAGNOSIS — M5030 Other cervical disc degeneration, unspecified cervical region: Secondary | ICD-10-CM | POA: Diagnosis not present

## 2020-02-22 DIAGNOSIS — C160 Malignant neoplasm of cardia: Secondary | ICD-10-CM | POA: Diagnosis not present

## 2020-02-22 DIAGNOSIS — Z6838 Body mass index (BMI) 38.0-38.9, adult: Secondary | ICD-10-CM | POA: Diagnosis not present

## 2020-02-22 DIAGNOSIS — M7989 Other specified soft tissue disorders: Secondary | ICD-10-CM | POA: Diagnosis not present

## 2020-02-22 DIAGNOSIS — R195 Other fecal abnormalities: Secondary | ICD-10-CM | POA: Diagnosis not present

## 2020-02-22 DIAGNOSIS — M12811 Other specific arthropathies, not elsewhere classified, right shoulder: Secondary | ICD-10-CM | POA: Diagnosis not present

## 2020-02-22 DIAGNOSIS — M4722 Other spondylosis with radiculopathy, cervical region: Secondary | ICD-10-CM | POA: Diagnosis not present

## 2020-02-22 DIAGNOSIS — C50812 Malignant neoplasm of overlapping sites of left female breast: Secondary | ICD-10-CM | POA: Diagnosis not present

## 2020-02-22 DIAGNOSIS — M1711 Unilateral primary osteoarthritis, right knee: Secondary | ICD-10-CM | POA: Diagnosis not present

## 2020-02-22 DIAGNOSIS — M438X6 Other specified deforming dorsopathies, lumbar region: Secondary | ICD-10-CM | POA: Diagnosis not present

## 2020-02-22 DIAGNOSIS — M5127 Other intervertebral disc displacement, lumbosacral region: Secondary | ICD-10-CM | POA: Diagnosis not present

## 2020-02-22 DIAGNOSIS — M5441 Lumbago with sciatica, right side: Secondary | ICD-10-CM | POA: Diagnosis not present

## 2020-02-22 DIAGNOSIS — S41112D Laceration without foreign body of left upper arm, subsequent encounter: Secondary | ICD-10-CM | POA: Diagnosis not present

## 2020-02-22 DIAGNOSIS — C44712 Basal cell carcinoma of skin of right lower limb, including hip: Secondary | ICD-10-CM | POA: Diagnosis not present

## 2020-02-22 DIAGNOSIS — L700 Acne vulgaris: Secondary | ICD-10-CM | POA: Diagnosis not present

## 2020-02-22 DIAGNOSIS — M13841 Other specified arthritis, right hand: Secondary | ICD-10-CM | POA: Diagnosis not present

## 2020-02-22 DIAGNOSIS — Z89431 Acquired absence of right foot: Secondary | ICD-10-CM | POA: Diagnosis not present

## 2020-02-22 DIAGNOSIS — I1310 Hypertensive heart and chronic kidney disease without heart failure, with stage 1 through stage 4 chronic kidney disease, or unspecified chronic kidney disease: Secondary | ICD-10-CM | POA: Diagnosis not present

## 2020-02-22 DIAGNOSIS — H54415A Blindness right eye category 5, normal vision left eye: Secondary | ICD-10-CM | POA: Diagnosis not present

## 2020-02-22 DIAGNOSIS — Z872 Personal history of diseases of the skin and subcutaneous tissue: Secondary | ICD-10-CM | POA: Diagnosis not present

## 2020-02-22 DIAGNOSIS — M47812 Spondylosis without myelopathy or radiculopathy, cervical region: Secondary | ICD-10-CM | POA: Diagnosis not present

## 2020-02-22 DIAGNOSIS — S22000D Wedge compression fracture of unspecified thoracic vertebra, subsequent encounter for fracture with routine healing: Secondary | ICD-10-CM | POA: Diagnosis not present

## 2020-02-22 DIAGNOSIS — S82261J Displaced segmental fracture of shaft of right tibia, subsequent encounter for open fracture type IIIA, IIIB, or IIIC with delayed healing: Secondary | ICD-10-CM | POA: Diagnosis not present

## 2020-02-22 DIAGNOSIS — M62838 Other muscle spasm: Secondary | ICD-10-CM | POA: Diagnosis not present

## 2020-02-22 DIAGNOSIS — I5041 Acute combined systolic (congestive) and diastolic (congestive) heart failure: Secondary | ICD-10-CM | POA: Diagnosis not present

## 2020-02-22 DIAGNOSIS — L97511 Non-pressure chronic ulcer of other part of right foot limited to breakdown of skin: Secondary | ICD-10-CM | POA: Diagnosis not present

## 2020-02-22 DIAGNOSIS — R140 Abdominal distension (gaseous): Secondary | ICD-10-CM | POA: Diagnosis not present

## 2020-02-22 DIAGNOSIS — I781 Nevus, non-neoplastic: Secondary | ICD-10-CM | POA: Diagnosis not present

## 2020-02-22 DIAGNOSIS — R040 Epistaxis: Secondary | ICD-10-CM | POA: Diagnosis not present

## 2020-02-22 DIAGNOSIS — E875 Hyperkalemia: Secondary | ICD-10-CM | POA: Diagnosis not present

## 2020-02-22 DIAGNOSIS — C9110 Chronic lymphocytic leukemia of B-cell type not having achieved remission: Secondary | ICD-10-CM | POA: Diagnosis not present

## 2020-02-22 DIAGNOSIS — H35371 Puckering of macula, right eye: Secondary | ICD-10-CM | POA: Diagnosis not present

## 2020-02-22 DIAGNOSIS — N184 Chronic kidney disease, stage 4 (severe): Secondary | ICD-10-CM | POA: Diagnosis not present

## 2020-02-22 DIAGNOSIS — M76892 Other specified enthesopathies of left lower limb, excluding foot: Secondary | ICD-10-CM | POA: Diagnosis not present

## 2020-02-22 DIAGNOSIS — L409 Psoriasis, unspecified: Secondary | ICD-10-CM | POA: Diagnosis not present

## 2020-02-22 DIAGNOSIS — L97422 Non-pressure chronic ulcer of left heel and midfoot with fat layer exposed: Secondary | ICD-10-CM | POA: Diagnosis not present

## 2020-02-22 DIAGNOSIS — F064 Anxiety disorder due to known physiological condition: Secondary | ICD-10-CM | POA: Diagnosis not present

## 2020-02-22 DIAGNOSIS — T466X5A Adverse effect of antihyperlipidemic and antiarteriosclerotic drugs, initial encounter: Secondary | ICD-10-CM | POA: Diagnosis not present

## 2020-02-22 DIAGNOSIS — L03116 Cellulitis of left lower limb: Secondary | ICD-10-CM | POA: Diagnosis not present

## 2020-02-22 DIAGNOSIS — Z87440 Personal history of urinary (tract) infections: Secondary | ICD-10-CM | POA: Diagnosis not present

## 2020-02-22 DIAGNOSIS — M1030 Gout due to renal impairment, unspecified site: Secondary | ICD-10-CM | POA: Diagnosis not present

## 2020-02-22 DIAGNOSIS — Z13220 Encounter for screening for lipoid disorders: Secondary | ICD-10-CM | POA: Diagnosis not present

## 2020-02-22 DIAGNOSIS — M1A09X0 Idiopathic chronic gout, multiple sites, without tophus (tophi): Secondary | ICD-10-CM | POA: Diagnosis not present

## 2020-02-22 DIAGNOSIS — M1A00X1 Idiopathic chronic gout, unspecified site, with tophus (tophi): Secondary | ICD-10-CM | POA: Diagnosis not present

## 2020-02-22 DIAGNOSIS — L298 Other pruritus: Secondary | ICD-10-CM | POA: Diagnosis not present

## 2020-02-22 DIAGNOSIS — Z794 Long term (current) use of insulin: Secondary | ICD-10-CM | POA: Diagnosis not present

## 2020-02-22 DIAGNOSIS — R6882 Decreased libido: Secondary | ICD-10-CM | POA: Diagnosis not present

## 2020-02-22 DIAGNOSIS — N3945 Continuous leakage: Secondary | ICD-10-CM | POA: Diagnosis not present

## 2020-02-22 DIAGNOSIS — Z6827 Body mass index (BMI) 27.0-27.9, adult: Secondary | ICD-10-CM | POA: Diagnosis not present

## 2020-02-22 DIAGNOSIS — M79676 Pain in unspecified toe(s): Secondary | ICD-10-CM | POA: Diagnosis not present

## 2020-02-22 DIAGNOSIS — M816 Localized osteoporosis [Lequesne]: Secondary | ICD-10-CM | POA: Diagnosis not present

## 2020-02-22 DIAGNOSIS — Z1212 Encounter for screening for malignant neoplasm of rectum: Secondary | ICD-10-CM | POA: Diagnosis not present

## 2020-02-22 DIAGNOSIS — E785 Hyperlipidemia, unspecified: Secondary | ICD-10-CM | POA: Diagnosis not present

## 2020-02-22 DIAGNOSIS — B351 Tinea unguium: Secondary | ICD-10-CM | POA: Diagnosis not present

## 2020-02-22 DIAGNOSIS — M6281 Muscle weakness (generalized): Secondary | ICD-10-CM | POA: Diagnosis not present

## 2020-02-22 DIAGNOSIS — R339 Retention of urine, unspecified: Secondary | ICD-10-CM | POA: Diagnosis not present

## 2020-02-22 DIAGNOSIS — C50412 Malignant neoplasm of upper-outer quadrant of left female breast: Secondary | ICD-10-CM | POA: Diagnosis not present

## 2020-02-22 DIAGNOSIS — E1149 Type 2 diabetes mellitus with other diabetic neurological complication: Secondary | ICD-10-CM | POA: Diagnosis not present

## 2020-02-22 DIAGNOSIS — C4441 Basal cell carcinoma of skin of scalp and neck: Secondary | ICD-10-CM | POA: Diagnosis not present

## 2020-02-22 DIAGNOSIS — I714 Abdominal aortic aneurysm, without rupture: Secondary | ICD-10-CM | POA: Diagnosis not present

## 2020-02-22 DIAGNOSIS — G903 Multi-system degeneration of the autonomic nervous system: Secondary | ICD-10-CM | POA: Diagnosis not present

## 2020-02-22 DIAGNOSIS — M4316 Spondylolisthesis, lumbar region: Secondary | ICD-10-CM | POA: Diagnosis not present

## 2020-02-22 DIAGNOSIS — H35351 Cystoid macular degeneration, right eye: Secondary | ICD-10-CM | POA: Diagnosis not present

## 2020-02-22 DIAGNOSIS — I83019 Varicose veins of right lower extremity with ulcer of unspecified site: Secondary | ICD-10-CM | POA: Diagnosis not present

## 2020-02-22 DIAGNOSIS — R42 Dizziness and giddiness: Secondary | ICD-10-CM | POA: Diagnosis not present

## 2020-02-22 DIAGNOSIS — B029 Zoster without complications: Secondary | ICD-10-CM | POA: Diagnosis not present

## 2020-02-22 DIAGNOSIS — M25541 Pain in joints of right hand: Secondary | ICD-10-CM | POA: Diagnosis not present

## 2020-02-22 DIAGNOSIS — G300 Alzheimer's disease with early onset: Secondary | ICD-10-CM | POA: Diagnosis not present

## 2020-02-22 DIAGNOSIS — S42202D Unspecified fracture of upper end of left humerus, subsequent encounter for fracture with routine healing: Secondary | ICD-10-CM | POA: Diagnosis not present

## 2020-02-22 DIAGNOSIS — M064 Inflammatory polyarthropathy: Secondary | ICD-10-CM | POA: Diagnosis not present

## 2020-02-22 DIAGNOSIS — N3281 Overactive bladder: Secondary | ICD-10-CM | POA: Diagnosis not present

## 2020-02-22 DIAGNOSIS — R1313 Dysphagia, pharyngeal phase: Secondary | ICD-10-CM | POA: Diagnosis not present

## 2020-02-22 DIAGNOSIS — M791 Myalgia, unspecified site: Secondary | ICD-10-CM | POA: Diagnosis not present

## 2020-02-22 DIAGNOSIS — H61002 Unspecified perichondritis of left external ear: Secondary | ICD-10-CM | POA: Diagnosis not present

## 2020-02-22 DIAGNOSIS — M9903 Segmental and somatic dysfunction of lumbar region: Secondary | ICD-10-CM | POA: Diagnosis not present

## 2020-02-22 DIAGNOSIS — C44319 Basal cell carcinoma of skin of other parts of face: Secondary | ICD-10-CM | POA: Diagnosis not present

## 2020-02-22 DIAGNOSIS — D510 Vitamin B12 deficiency anemia due to intrinsic factor deficiency: Secondary | ICD-10-CM | POA: Diagnosis not present

## 2020-02-22 DIAGNOSIS — T451X5A Adverse effect of antineoplastic and immunosuppressive drugs, initial encounter: Secondary | ICD-10-CM | POA: Diagnosis not present

## 2020-02-22 DIAGNOSIS — R1013 Epigastric pain: Secondary | ICD-10-CM | POA: Diagnosis not present

## 2020-02-22 DIAGNOSIS — J300 Vasomotor rhinitis: Secondary | ICD-10-CM | POA: Diagnosis not present

## 2020-02-22 DIAGNOSIS — Z9884 Bariatric surgery status: Secondary | ICD-10-CM | POA: Diagnosis not present

## 2020-02-22 DIAGNOSIS — M531 Cervicobrachial syndrome: Secondary | ICD-10-CM | POA: Diagnosis not present

## 2020-02-22 DIAGNOSIS — R627 Adult failure to thrive: Secondary | ICD-10-CM | POA: Diagnosis not present

## 2020-02-22 DIAGNOSIS — F0150 Vascular dementia without behavioral disturbance: Secondary | ICD-10-CM | POA: Diagnosis not present

## 2020-02-22 DIAGNOSIS — I495 Sick sinus syndrome: Secondary | ICD-10-CM | POA: Diagnosis not present

## 2020-02-22 DIAGNOSIS — I87313 Chronic venous hypertension (idiopathic) with ulcer of bilateral lower extremity: Secondary | ICD-10-CM | POA: Diagnosis not present

## 2020-02-22 DIAGNOSIS — S46012D Strain of muscle(s) and tendon(s) of the rotator cuff of left shoulder, subsequent encounter: Secondary | ICD-10-CM | POA: Diagnosis not present

## 2020-02-22 DIAGNOSIS — N401 Enlarged prostate with lower urinary tract symptoms: Secondary | ICD-10-CM | POA: Diagnosis not present

## 2020-02-22 DIAGNOSIS — J4521 Mild intermittent asthma with (acute) exacerbation: Secondary | ICD-10-CM | POA: Diagnosis not present

## 2020-02-22 DIAGNOSIS — I5020 Unspecified systolic (congestive) heart failure: Secondary | ICD-10-CM | POA: Diagnosis not present

## 2020-02-22 DIAGNOSIS — H3562 Retinal hemorrhage, left eye: Secondary | ICD-10-CM | POA: Diagnosis not present

## 2020-02-22 DIAGNOSIS — G4733 Obstructive sleep apnea (adult) (pediatric): Secondary | ICD-10-CM | POA: Diagnosis not present

## 2020-02-22 DIAGNOSIS — Z89412 Acquired absence of left great toe: Secondary | ICD-10-CM | POA: Diagnosis not present

## 2020-02-22 DIAGNOSIS — Z89511 Acquired absence of right leg below knee: Secondary | ICD-10-CM | POA: Diagnosis not present

## 2020-02-22 DIAGNOSIS — C155 Malignant neoplasm of lower third of esophagus: Secondary | ICD-10-CM | POA: Diagnosis not present

## 2020-02-22 DIAGNOSIS — F251 Schizoaffective disorder, depressive type: Secondary | ICD-10-CM | POA: Diagnosis not present

## 2020-02-22 DIAGNOSIS — K2990 Gastroduodenitis, unspecified, without bleeding: Secondary | ICD-10-CM | POA: Diagnosis not present

## 2020-02-22 DIAGNOSIS — N041 Nephrotic syndrome with focal and segmental glomerular lesions: Secondary | ICD-10-CM | POA: Diagnosis not present

## 2020-02-22 DIAGNOSIS — M12812 Other specific arthropathies, not elsewhere classified, left shoulder: Secondary | ICD-10-CM | POA: Diagnosis not present

## 2020-02-22 DIAGNOSIS — F909 Attention-deficit hyperactivity disorder, unspecified type: Secondary | ICD-10-CM | POA: Diagnosis not present

## 2020-02-22 DIAGNOSIS — D492 Neoplasm of unspecified behavior of bone, soft tissue, and skin: Secondary | ICD-10-CM | POA: Diagnosis not present

## 2020-02-22 DIAGNOSIS — E119 Type 2 diabetes mellitus without complications: Secondary | ICD-10-CM | POA: Diagnosis not present

## 2020-02-22 DIAGNOSIS — J4540 Moderate persistent asthma, uncomplicated: Secondary | ICD-10-CM | POA: Diagnosis not present

## 2020-02-22 DIAGNOSIS — M19071 Primary osteoarthritis, right ankle and foot: Secondary | ICD-10-CM | POA: Diagnosis not present

## 2020-02-22 DIAGNOSIS — I441 Atrioventricular block, second degree: Secondary | ICD-10-CM | POA: Diagnosis not present

## 2020-02-22 DIAGNOSIS — F3341 Major depressive disorder, recurrent, in partial remission: Secondary | ICD-10-CM | POA: Diagnosis not present

## 2020-02-22 DIAGNOSIS — C3492 Malignant neoplasm of unspecified part of left bronchus or lung: Secondary | ICD-10-CM | POA: Diagnosis not present

## 2020-02-22 DIAGNOSIS — Z516 Encounter for desensitization to allergens: Secondary | ICD-10-CM | POA: Diagnosis not present

## 2020-02-22 DIAGNOSIS — R2689 Other abnormalities of gait and mobility: Secondary | ICD-10-CM | POA: Diagnosis not present

## 2020-02-22 DIAGNOSIS — G251 Drug-induced tremor: Secondary | ICD-10-CM | POA: Diagnosis not present

## 2020-02-22 DIAGNOSIS — Z1211 Encounter for screening for malignant neoplasm of colon: Secondary | ICD-10-CM | POA: Diagnosis not present

## 2020-02-22 DIAGNOSIS — R5381 Other malaise: Secondary | ICD-10-CM | POA: Diagnosis not present

## 2020-02-22 DIAGNOSIS — C44619 Basal cell carcinoma of skin of left upper limb, including shoulder: Secondary | ICD-10-CM | POA: Diagnosis not present

## 2020-02-22 DIAGNOSIS — J302 Other seasonal allergic rhinitis: Secondary | ICD-10-CM | POA: Diagnosis not present

## 2020-02-22 DIAGNOSIS — I453 Trifascicular block: Secondary | ICD-10-CM | POA: Diagnosis not present

## 2020-02-22 DIAGNOSIS — I088 Other rheumatic multiple valve diseases: Secondary | ICD-10-CM | POA: Diagnosis not present

## 2020-02-22 DIAGNOSIS — F0280 Dementia in other diseases classified elsewhere without behavioral disturbance: Secondary | ICD-10-CM | POA: Diagnosis not present

## 2020-02-22 DIAGNOSIS — D1801 Hemangioma of skin and subcutaneous tissue: Secondary | ICD-10-CM | POA: Diagnosis not present

## 2020-02-22 DIAGNOSIS — L728 Other follicular cysts of the skin and subcutaneous tissue: Secondary | ICD-10-CM | POA: Diagnosis not present

## 2020-02-22 DIAGNOSIS — N1831 Chronic kidney disease, stage 3a: Secondary | ICD-10-CM | POA: Diagnosis not present

## 2020-02-22 DIAGNOSIS — M25611 Stiffness of right shoulder, not elsewhere classified: Secondary | ICD-10-CM | POA: Diagnosis not present

## 2020-02-22 DIAGNOSIS — Z20822 Contact with and (suspected) exposure to covid-19: Secondary | ICD-10-CM | POA: Diagnosis not present

## 2020-02-22 DIAGNOSIS — C8331 Diffuse large B-cell lymphoma, lymph nodes of head, face, and neck: Secondary | ICD-10-CM | POA: Diagnosis not present

## 2020-02-22 DIAGNOSIS — M79621 Pain in right upper arm: Secondary | ICD-10-CM | POA: Diagnosis not present

## 2020-02-22 DIAGNOSIS — M48062 Spinal stenosis, lumbar region with neurogenic claudication: Secondary | ICD-10-CM | POA: Diagnosis not present

## 2020-02-22 DIAGNOSIS — C7931 Secondary malignant neoplasm of brain: Secondary | ICD-10-CM | POA: Diagnosis not present

## 2020-02-22 DIAGNOSIS — I6601 Occlusion and stenosis of right middle cerebral artery: Secondary | ICD-10-CM | POA: Diagnosis not present

## 2020-02-22 DIAGNOSIS — H8110 Benign paroxysmal vertigo, unspecified ear: Secondary | ICD-10-CM | POA: Diagnosis not present

## 2020-02-22 DIAGNOSIS — F332 Major depressive disorder, recurrent severe without psychotic features: Secondary | ICD-10-CM | POA: Diagnosis not present

## 2020-02-22 DIAGNOSIS — I5021 Acute systolic (congestive) heart failure: Secondary | ICD-10-CM | POA: Diagnosis not present

## 2020-02-22 DIAGNOSIS — M25675 Stiffness of left foot, not elsewhere classified: Secondary | ICD-10-CM | POA: Diagnosis not present

## 2020-02-22 DIAGNOSIS — T82858A Stenosis of vascular prosthetic devices, implants and grafts, initial encounter: Secondary | ICD-10-CM | POA: Diagnosis not present

## 2020-02-22 DIAGNOSIS — Z6824 Body mass index (BMI) 24.0-24.9, adult: Secondary | ICD-10-CM | POA: Diagnosis not present

## 2020-02-22 DIAGNOSIS — D128 Benign neoplasm of rectum: Secondary | ICD-10-CM | POA: Diagnosis not present

## 2020-02-22 DIAGNOSIS — R6889 Other general symptoms and signs: Secondary | ICD-10-CM | POA: Diagnosis not present

## 2020-02-22 DIAGNOSIS — M9901 Segmental and somatic dysfunction of cervical region: Secondary | ICD-10-CM | POA: Diagnosis not present

## 2020-02-22 DIAGNOSIS — H3321 Serous retinal detachment, right eye: Secondary | ICD-10-CM | POA: Diagnosis not present

## 2020-02-22 DIAGNOSIS — Z9002 Acquired absence of larynx: Secondary | ICD-10-CM | POA: Diagnosis not present

## 2020-02-22 DIAGNOSIS — R1310 Dysphagia, unspecified: Secondary | ICD-10-CM | POA: Diagnosis not present

## 2020-02-22 DIAGNOSIS — I872 Venous insufficiency (chronic) (peripheral): Secondary | ICD-10-CM | POA: Diagnosis not present

## 2020-02-22 DIAGNOSIS — D225 Melanocytic nevi of trunk: Secondary | ICD-10-CM | POA: Diagnosis not present

## 2020-02-22 DIAGNOSIS — M25531 Pain in right wrist: Secondary | ICD-10-CM | POA: Diagnosis not present

## 2020-02-22 DIAGNOSIS — M1612 Unilateral primary osteoarthritis, left hip: Secondary | ICD-10-CM | POA: Diagnosis not present

## 2020-02-22 DIAGNOSIS — R509 Fever, unspecified: Secondary | ICD-10-CM | POA: Diagnosis not present

## 2020-02-22 DIAGNOSIS — H2511 Age-related nuclear cataract, right eye: Secondary | ICD-10-CM | POA: Diagnosis not present

## 2020-02-22 DIAGNOSIS — M48061 Spinal stenosis, lumbar region without neurogenic claudication: Secondary | ICD-10-CM | POA: Diagnosis not present

## 2020-02-22 DIAGNOSIS — G43909 Migraine, unspecified, not intractable, without status migrainosus: Secondary | ICD-10-CM | POA: Diagnosis not present

## 2020-02-22 DIAGNOSIS — F3340 Major depressive disorder, recurrent, in remission, unspecified: Secondary | ICD-10-CM | POA: Diagnosis not present

## 2020-02-22 DIAGNOSIS — R3915 Urgency of urination: Secondary | ICD-10-CM | POA: Diagnosis not present

## 2020-02-22 DIAGNOSIS — M179 Osteoarthritis of knee, unspecified: Secondary | ICD-10-CM | POA: Diagnosis not present

## 2020-02-22 DIAGNOSIS — C7989 Secondary malignant neoplasm of other specified sites: Secondary | ICD-10-CM | POA: Diagnosis not present

## 2020-02-22 DIAGNOSIS — Z91040 Latex allergy status: Secondary | ICD-10-CM | POA: Diagnosis not present

## 2020-02-22 DIAGNOSIS — J410 Simple chronic bronchitis: Secondary | ICD-10-CM | POA: Diagnosis not present

## 2020-02-22 DIAGNOSIS — N189 Chronic kidney disease, unspecified: Secondary | ICD-10-CM | POA: Diagnosis not present

## 2020-02-22 DIAGNOSIS — I701 Atherosclerosis of renal artery: Secondary | ICD-10-CM | POA: Diagnosis not present

## 2020-02-22 DIAGNOSIS — I25810 Atherosclerosis of coronary artery bypass graft(s) without angina pectoris: Secondary | ICD-10-CM | POA: Diagnosis not present

## 2020-02-22 DIAGNOSIS — L97519 Non-pressure chronic ulcer of other part of right foot with unspecified severity: Secondary | ICD-10-CM | POA: Diagnosis not present

## 2020-02-22 DIAGNOSIS — J310 Chronic rhinitis: Secondary | ICD-10-CM | POA: Diagnosis not present

## 2020-02-22 DIAGNOSIS — Z1389 Encounter for screening for other disorder: Secondary | ICD-10-CM | POA: Diagnosis not present

## 2020-02-22 DIAGNOSIS — E781 Pure hyperglyceridemia: Secondary | ICD-10-CM | POA: Diagnosis not present

## 2020-02-22 DIAGNOSIS — L659 Nonscarring hair loss, unspecified: Secondary | ICD-10-CM | POA: Diagnosis not present

## 2020-02-22 DIAGNOSIS — N3000 Acute cystitis without hematuria: Secondary | ICD-10-CM | POA: Diagnosis not present

## 2020-02-22 DIAGNOSIS — Z9012 Acquired absence of left breast and nipple: Secondary | ICD-10-CM | POA: Diagnosis not present

## 2020-02-22 DIAGNOSIS — R069 Unspecified abnormalities of breathing: Secondary | ICD-10-CM | POA: Diagnosis not present

## 2020-02-22 DIAGNOSIS — E1143 Type 2 diabetes mellitus with diabetic autonomic (poly)neuropathy: Secondary | ICD-10-CM | POA: Diagnosis not present

## 2020-02-22 DIAGNOSIS — M778 Other enthesopathies, not elsewhere classified: Secondary | ICD-10-CM | POA: Diagnosis not present

## 2020-02-22 DIAGNOSIS — H26492 Other secondary cataract, left eye: Secondary | ICD-10-CM | POA: Diagnosis not present

## 2020-02-22 DIAGNOSIS — L84 Corns and callosities: Secondary | ICD-10-CM | POA: Diagnosis not present

## 2020-02-22 DIAGNOSIS — Z6839 Body mass index (BMI) 39.0-39.9, adult: Secondary | ICD-10-CM | POA: Diagnosis not present

## 2020-02-22 DIAGNOSIS — E663 Overweight: Secondary | ICD-10-CM | POA: Diagnosis not present

## 2020-02-22 DIAGNOSIS — L02222 Furuncle of back [any part, except buttock]: Secondary | ICD-10-CM | POA: Diagnosis not present

## 2020-02-22 DIAGNOSIS — M8000XG Age-related osteoporosis with current pathological fracture, unspecified site, subsequent encounter for fracture with delayed healing: Secondary | ICD-10-CM | POA: Diagnosis not present

## 2020-02-22 DIAGNOSIS — N410 Acute prostatitis: Secondary | ICD-10-CM | POA: Diagnosis not present

## 2020-02-22 DIAGNOSIS — E11610 Type 2 diabetes mellitus with diabetic neuropathic arthropathy: Secondary | ICD-10-CM | POA: Diagnosis not present

## 2020-02-22 DIAGNOSIS — H9312 Tinnitus, left ear: Secondary | ICD-10-CM | POA: Diagnosis not present

## 2020-02-22 DIAGNOSIS — M436 Torticollis: Secondary | ICD-10-CM | POA: Diagnosis not present

## 2020-02-22 DIAGNOSIS — R3129 Other microscopic hematuria: Secondary | ICD-10-CM | POA: Diagnosis not present

## 2020-02-22 DIAGNOSIS — E291 Testicular hypofunction: Secondary | ICD-10-CM | POA: Diagnosis not present

## 2020-02-22 DIAGNOSIS — R4189 Other symptoms and signs involving cognitive functions and awareness: Secondary | ICD-10-CM | POA: Diagnosis not present

## 2020-02-22 DIAGNOSIS — Z789 Other specified health status: Secondary | ICD-10-CM | POA: Diagnosis not present

## 2020-02-22 DIAGNOSIS — D045 Carcinoma in situ of skin of trunk: Secondary | ICD-10-CM | POA: Diagnosis not present

## 2020-02-22 DIAGNOSIS — R200 Anesthesia of skin: Secondary | ICD-10-CM | POA: Diagnosis not present

## 2020-02-22 DIAGNOSIS — Z171 Estrogen receptor negative status [ER-]: Secondary | ICD-10-CM | POA: Diagnosis not present

## 2020-02-22 DIAGNOSIS — J309 Allergic rhinitis, unspecified: Secondary | ICD-10-CM | POA: Diagnosis not present

## 2020-02-22 DIAGNOSIS — M238X1 Other internal derangements of right knee: Secondary | ICD-10-CM | POA: Diagnosis not present

## 2020-02-22 DIAGNOSIS — G9341 Metabolic encephalopathy: Secondary | ICD-10-CM | POA: Diagnosis not present

## 2020-02-22 DIAGNOSIS — H548 Legal blindness, as defined in USA: Secondary | ICD-10-CM | POA: Diagnosis not present

## 2020-02-22 DIAGNOSIS — H0100B Unspecified blepharitis left eye, upper and lower eyelids: Secondary | ICD-10-CM | POA: Diagnosis not present

## 2020-02-22 DIAGNOSIS — S93402A Sprain of unspecified ligament of left ankle, initial encounter: Secondary | ICD-10-CM | POA: Diagnosis not present

## 2020-02-22 DIAGNOSIS — G20 Parkinson's disease: Secondary | ICD-10-CM | POA: Diagnosis not present

## 2020-02-22 DIAGNOSIS — T63461D Toxic effect of venom of wasps, accidental (unintentional), subsequent encounter: Secondary | ICD-10-CM | POA: Diagnosis not present

## 2020-02-22 DIAGNOSIS — R152 Fecal urgency: Secondary | ICD-10-CM | POA: Diagnosis not present

## 2020-02-22 DIAGNOSIS — I25119 Atherosclerotic heart disease of native coronary artery with unspecified angina pectoris: Secondary | ICD-10-CM | POA: Diagnosis not present

## 2020-02-22 DIAGNOSIS — E1136 Type 2 diabetes mellitus with diabetic cataract: Secondary | ICD-10-CM | POA: Diagnosis not present

## 2020-02-22 DIAGNOSIS — Z1383 Encounter for screening for respiratory disorder NEC: Secondary | ICD-10-CM | POA: Diagnosis not present

## 2020-02-22 DIAGNOSIS — Z96642 Presence of left artificial hip joint: Secondary | ICD-10-CM | POA: Diagnosis not present

## 2020-02-22 DIAGNOSIS — R945 Abnormal results of liver function studies: Secondary | ICD-10-CM | POA: Diagnosis not present

## 2020-02-22 DIAGNOSIS — S143XXD Injury of brachial plexus, subsequent encounter: Secondary | ICD-10-CM | POA: Diagnosis not present

## 2020-02-22 DIAGNOSIS — M4727 Other spondylosis with radiculopathy, lumbosacral region: Secondary | ICD-10-CM | POA: Diagnosis not present

## 2020-02-22 DIAGNOSIS — R001 Bradycardia, unspecified: Secondary | ICD-10-CM | POA: Diagnosis not present

## 2020-02-22 DIAGNOSIS — H43813 Vitreous degeneration, bilateral: Secondary | ICD-10-CM | POA: Diagnosis not present

## 2020-02-22 DIAGNOSIS — M0579 Rheumatoid arthritis with rheumatoid factor of multiple sites without organ or systems involvement: Secondary | ICD-10-CM | POA: Diagnosis not present

## 2020-02-22 DIAGNOSIS — K3189 Other diseases of stomach and duodenum: Secondary | ICD-10-CM | POA: Diagnosis not present

## 2020-02-22 DIAGNOSIS — Z20828 Contact with and (suspected) exposure to other viral communicable diseases: Secondary | ICD-10-CM | POA: Diagnosis not present

## 2020-02-22 DIAGNOSIS — C9002 Multiple myeloma in relapse: Secondary | ICD-10-CM | POA: Diagnosis not present

## 2020-02-22 DIAGNOSIS — L218 Other seborrheic dermatitis: Secondary | ICD-10-CM | POA: Diagnosis not present

## 2020-02-22 DIAGNOSIS — Z681 Body mass index (BMI) 19 or less, adult: Secondary | ICD-10-CM | POA: Diagnosis not present

## 2020-02-22 DIAGNOSIS — R21 Rash and other nonspecific skin eruption: Secondary | ICD-10-CM | POA: Diagnosis not present

## 2020-02-22 DIAGNOSIS — R454 Irritability and anger: Secondary | ICD-10-CM | POA: Diagnosis not present

## 2020-02-22 DIAGNOSIS — I779 Disorder of arteries and arterioles, unspecified: Secondary | ICD-10-CM | POA: Diagnosis not present

## 2020-02-22 DIAGNOSIS — G609 Hereditary and idiopathic neuropathy, unspecified: Secondary | ICD-10-CM | POA: Diagnosis not present

## 2020-02-22 DIAGNOSIS — N182 Chronic kidney disease, stage 2 (mild): Secondary | ICD-10-CM | POA: Diagnosis not present

## 2020-02-22 DIAGNOSIS — H9313 Tinnitus, bilateral: Secondary | ICD-10-CM | POA: Diagnosis not present

## 2020-02-22 DIAGNOSIS — L819 Disorder of pigmentation, unspecified: Secondary | ICD-10-CM | POA: Diagnosis not present

## 2020-02-22 DIAGNOSIS — M2041 Other hammer toe(s) (acquired), right foot: Secondary | ICD-10-CM | POA: Diagnosis not present

## 2020-02-22 DIAGNOSIS — S42201D Unspecified fracture of upper end of right humerus, subsequent encounter for fracture with routine healing: Secondary | ICD-10-CM | POA: Diagnosis not present

## 2020-02-22 DIAGNOSIS — R972 Elevated prostate specific antigen [PSA]: Secondary | ICD-10-CM | POA: Diagnosis not present

## 2020-02-22 DIAGNOSIS — I341 Nonrheumatic mitral (valve) prolapse: Secondary | ICD-10-CM | POA: Diagnosis not present

## 2020-02-22 DIAGNOSIS — S40021A Contusion of right upper arm, initial encounter: Secondary | ICD-10-CM | POA: Diagnosis not present

## 2020-02-22 DIAGNOSIS — F0151 Vascular dementia with behavioral disturbance: Secondary | ICD-10-CM | POA: Diagnosis not present

## 2020-02-22 DIAGNOSIS — K8689 Other specified diseases of pancreas: Secondary | ICD-10-CM | POA: Diagnosis not present

## 2020-02-22 DIAGNOSIS — R309 Painful micturition, unspecified: Secondary | ICD-10-CM | POA: Diagnosis not present

## 2020-02-22 DIAGNOSIS — Z853 Personal history of malignant neoplasm of breast: Secondary | ICD-10-CM | POA: Diagnosis not present

## 2020-02-22 DIAGNOSIS — R519 Headache, unspecified: Secondary | ICD-10-CM | POA: Diagnosis not present

## 2020-02-22 DIAGNOSIS — T380X5A Adverse effect of glucocorticoids and synthetic analogues, initial encounter: Secondary | ICD-10-CM | POA: Diagnosis not present

## 2020-02-22 DIAGNOSIS — J340 Abscess, furuncle and carbuncle of nose: Secondary | ICD-10-CM | POA: Diagnosis not present

## 2020-02-22 DIAGNOSIS — R41840 Attention and concentration deficit: Secondary | ICD-10-CM | POA: Diagnosis not present

## 2020-02-22 DIAGNOSIS — I482 Chronic atrial fibrillation, unspecified: Secondary | ICD-10-CM | POA: Diagnosis not present

## 2020-02-22 DIAGNOSIS — R9389 Abnormal findings on diagnostic imaging of other specified body structures: Secondary | ICD-10-CM | POA: Diagnosis not present

## 2020-02-22 DIAGNOSIS — D472 Monoclonal gammopathy: Secondary | ICD-10-CM | POA: Diagnosis not present

## 2020-02-22 DIAGNOSIS — R109 Unspecified abdominal pain: Secondary | ICD-10-CM | POA: Diagnosis not present

## 2020-02-22 DIAGNOSIS — R296 Repeated falls: Secondary | ICD-10-CM | POA: Diagnosis not present

## 2020-02-22 DIAGNOSIS — L89310 Pressure ulcer of right buttock, unstageable: Secondary | ICD-10-CM | POA: Diagnosis not present

## 2020-02-22 DIAGNOSIS — N819 Female genital prolapse, unspecified: Secondary | ICD-10-CM | POA: Diagnosis not present

## 2020-02-22 DIAGNOSIS — F321 Major depressive disorder, single episode, moderate: Secondary | ICD-10-CM | POA: Diagnosis not present

## 2020-02-22 DIAGNOSIS — G620 Drug-induced polyneuropathy: Secondary | ICD-10-CM | POA: Diagnosis not present

## 2020-02-22 DIAGNOSIS — H26491 Other secondary cataract, right eye: Secondary | ICD-10-CM | POA: Diagnosis not present

## 2020-02-22 DIAGNOSIS — H35373 Puckering of macula, bilateral: Secondary | ICD-10-CM | POA: Diagnosis not present

## 2020-02-22 DIAGNOSIS — K5900 Constipation, unspecified: Secondary | ICD-10-CM | POA: Diagnosis not present

## 2020-02-22 DIAGNOSIS — L720 Epidermal cyst: Secondary | ICD-10-CM | POA: Diagnosis not present

## 2020-02-22 DIAGNOSIS — J3089 Other allergic rhinitis: Secondary | ICD-10-CM | POA: Diagnosis not present

## 2020-02-22 DIAGNOSIS — R52 Pain, unspecified: Secondary | ICD-10-CM | POA: Diagnosis not present

## 2020-02-22 DIAGNOSIS — E876 Hypokalemia: Secondary | ICD-10-CM | POA: Diagnosis not present

## 2020-02-22 DIAGNOSIS — K4090 Unilateral inguinal hernia, without obstruction or gangrene, not specified as recurrent: Secondary | ICD-10-CM | POA: Diagnosis not present

## 2020-02-22 DIAGNOSIS — I712 Thoracic aortic aneurysm, without rupture: Secondary | ICD-10-CM | POA: Diagnosis not present

## 2020-02-22 DIAGNOSIS — M722 Plantar fascial fibromatosis: Secondary | ICD-10-CM | POA: Diagnosis not present

## 2020-02-22 DIAGNOSIS — S82851A Displaced trimalleolar fracture of right lower leg, initial encounter for closed fracture: Secondary | ICD-10-CM | POA: Diagnosis not present

## 2020-02-22 DIAGNOSIS — E162 Hypoglycemia, unspecified: Secondary | ICD-10-CM | POA: Diagnosis not present

## 2020-02-22 DIAGNOSIS — M25512 Pain in left shoulder: Secondary | ICD-10-CM | POA: Diagnosis not present

## 2020-02-22 DIAGNOSIS — Z8673 Personal history of transient ischemic attack (TIA), and cerebral infarction without residual deficits: Secondary | ICD-10-CM | POA: Diagnosis not present

## 2020-02-22 DIAGNOSIS — M7918 Myalgia, other site: Secondary | ICD-10-CM | POA: Diagnosis not present

## 2020-02-22 DIAGNOSIS — R262 Difficulty in walking, not elsewhere classified: Secondary | ICD-10-CM | POA: Diagnosis not present

## 2020-02-22 DIAGNOSIS — F5105 Insomnia due to other mental disorder: Secondary | ICD-10-CM | POA: Diagnosis not present

## 2020-02-22 DIAGNOSIS — A310 Pulmonary mycobacterial infection: Secondary | ICD-10-CM | POA: Diagnosis not present

## 2020-02-22 DIAGNOSIS — M5430 Sciatica, unspecified side: Secondary | ICD-10-CM | POA: Diagnosis not present

## 2020-02-22 DIAGNOSIS — M797 Fibromyalgia: Secondary | ICD-10-CM | POA: Diagnosis not present

## 2020-02-22 DIAGNOSIS — F40240 Claustrophobia: Secondary | ICD-10-CM | POA: Diagnosis not present

## 2020-02-22 DIAGNOSIS — M7672 Peroneal tendinitis, left leg: Secondary | ICD-10-CM | POA: Diagnosis not present

## 2020-02-22 DIAGNOSIS — M25519 Pain in unspecified shoulder: Secondary | ICD-10-CM | POA: Diagnosis not present

## 2020-02-22 DIAGNOSIS — D123 Benign neoplasm of transverse colon: Secondary | ICD-10-CM | POA: Diagnosis not present

## 2020-02-22 DIAGNOSIS — Z85850 Personal history of malignant neoplasm of thyroid: Secondary | ICD-10-CM | POA: Diagnosis not present

## 2020-02-22 DIAGNOSIS — M2569 Stiffness of other specified joint, not elsewhere classified: Secondary | ICD-10-CM | POA: Diagnosis not present

## 2020-02-22 DIAGNOSIS — I255 Ischemic cardiomyopathy: Secondary | ICD-10-CM | POA: Diagnosis not present

## 2020-02-22 DIAGNOSIS — I208 Other forms of angina pectoris: Secondary | ICD-10-CM | POA: Diagnosis not present

## 2020-02-22 DIAGNOSIS — R159 Full incontinence of feces: Secondary | ICD-10-CM | POA: Diagnosis not present

## 2020-02-22 DIAGNOSIS — R011 Cardiac murmur, unspecified: Secondary | ICD-10-CM | POA: Diagnosis not present

## 2020-02-22 DIAGNOSIS — E878 Other disorders of electrolyte and fluid balance, not elsewhere classified: Secondary | ICD-10-CM | POA: Diagnosis not present

## 2020-02-22 DIAGNOSIS — H35422 Microcystoid degeneration of retina, left eye: Secondary | ICD-10-CM | POA: Diagnosis not present

## 2020-02-22 DIAGNOSIS — S2232XA Fracture of one rib, left side, initial encounter for closed fracture: Secondary | ICD-10-CM | POA: Diagnosis not present

## 2020-02-22 DIAGNOSIS — H9190 Unspecified hearing loss, unspecified ear: Secondary | ICD-10-CM | POA: Diagnosis not present

## 2020-02-22 DIAGNOSIS — Z299 Encounter for prophylactic measures, unspecified: Secondary | ICD-10-CM | POA: Diagnosis not present

## 2020-02-22 DIAGNOSIS — M7581 Other shoulder lesions, right shoulder: Secondary | ICD-10-CM | POA: Diagnosis not present

## 2020-02-22 DIAGNOSIS — D2239 Melanocytic nevi of other parts of face: Secondary | ICD-10-CM | POA: Diagnosis not present

## 2020-02-22 DIAGNOSIS — Z4682 Encounter for fitting and adjustment of non-vascular catheter: Secondary | ICD-10-CM | POA: Diagnosis not present

## 2020-02-22 DIAGNOSIS — Z9989 Dependence on other enabling machines and devices: Secondary | ICD-10-CM | POA: Diagnosis not present

## 2020-02-22 DIAGNOSIS — K640 First degree hemorrhoids: Secondary | ICD-10-CM | POA: Diagnosis not present

## 2020-02-22 DIAGNOSIS — G309 Alzheimer's disease, unspecified: Secondary | ICD-10-CM | POA: Diagnosis not present

## 2020-02-22 DIAGNOSIS — T17300A Unspecified foreign body in larynx causing asphyxiation, initial encounter: Secondary | ICD-10-CM | POA: Diagnosis not present

## 2020-02-22 DIAGNOSIS — M25559 Pain in unspecified hip: Secondary | ICD-10-CM | POA: Diagnosis not present

## 2020-02-22 DIAGNOSIS — I8312 Varicose veins of left lower extremity with inflammation: Secondary | ICD-10-CM | POA: Diagnosis not present

## 2020-02-22 DIAGNOSIS — Z885 Allergy status to narcotic agent status: Secondary | ICD-10-CM | POA: Diagnosis not present

## 2020-02-22 DIAGNOSIS — D6489 Other specified anemias: Secondary | ICD-10-CM | POA: Diagnosis not present

## 2020-02-22 DIAGNOSIS — D61818 Other pancytopenia: Secondary | ICD-10-CM | POA: Diagnosis not present

## 2020-02-22 DIAGNOSIS — H40013 Open angle with borderline findings, low risk, bilateral: Secondary | ICD-10-CM | POA: Diagnosis not present

## 2020-02-22 DIAGNOSIS — L03115 Cellulitis of right lower limb: Secondary | ICD-10-CM | POA: Diagnosis not present

## 2020-02-22 DIAGNOSIS — R233 Spontaneous ecchymoses: Secondary | ICD-10-CM | POA: Diagnosis not present

## 2020-02-22 DIAGNOSIS — M75112 Incomplete rotator cuff tear or rupture of left shoulder, not specified as traumatic: Secondary | ICD-10-CM | POA: Diagnosis not present

## 2020-02-22 DIAGNOSIS — R05 Cough: Secondary | ICD-10-CM | POA: Diagnosis not present

## 2020-02-22 DIAGNOSIS — C649 Malignant neoplasm of unspecified kidney, except renal pelvis: Secondary | ICD-10-CM | POA: Diagnosis not present

## 2020-02-22 DIAGNOSIS — U071 COVID-19: Secondary | ICD-10-CM | POA: Diagnosis not present

## 2020-02-22 DIAGNOSIS — I69351 Hemiplegia and hemiparesis following cerebral infarction affecting right dominant side: Secondary | ICD-10-CM | POA: Diagnosis not present

## 2020-02-22 DIAGNOSIS — D0512 Intraductal carcinoma in situ of left breast: Secondary | ICD-10-CM | POA: Diagnosis not present

## 2020-02-22 DIAGNOSIS — D122 Benign neoplasm of ascending colon: Secondary | ICD-10-CM | POA: Diagnosis not present

## 2020-02-22 DIAGNOSIS — M9904 Segmental and somatic dysfunction of sacral region: Secondary | ICD-10-CM | POA: Diagnosis not present

## 2020-02-22 DIAGNOSIS — F3181 Bipolar II disorder: Secondary | ICD-10-CM | POA: Diagnosis not present

## 2020-02-22 DIAGNOSIS — K7689 Other specified diseases of liver: Secondary | ICD-10-CM | POA: Diagnosis not present

## 2020-02-22 DIAGNOSIS — I743 Embolism and thrombosis of arteries of the lower extremities: Secondary | ICD-10-CM | POA: Diagnosis not present

## 2020-02-22 DIAGNOSIS — I4811 Longstanding persistent atrial fibrillation: Secondary | ICD-10-CM | POA: Diagnosis not present

## 2020-02-22 DIAGNOSIS — I517 Cardiomegaly: Secondary | ICD-10-CM | POA: Diagnosis not present

## 2020-02-22 DIAGNOSIS — C538 Malignant neoplasm of overlapping sites of cervix uteri: Secondary | ICD-10-CM | POA: Diagnosis not present

## 2020-02-22 DIAGNOSIS — R630 Anorexia: Secondary | ICD-10-CM | POA: Diagnosis not present

## 2020-02-22 DIAGNOSIS — I130 Hypertensive heart and chronic kidney disease with heart failure and stage 1 through stage 4 chronic kidney disease, or unspecified chronic kidney disease: Secondary | ICD-10-CM | POA: Diagnosis not present

## 2020-02-22 DIAGNOSIS — R12 Heartburn: Secondary | ICD-10-CM | POA: Diagnosis not present

## 2020-02-22 DIAGNOSIS — M9902 Segmental and somatic dysfunction of thoracic region: Secondary | ICD-10-CM | POA: Diagnosis not present

## 2020-02-22 DIAGNOSIS — M19032 Primary osteoarthritis, left wrist: Secondary | ICD-10-CM | POA: Diagnosis not present

## 2020-02-22 DIAGNOSIS — Z6825 Body mass index (BMI) 25.0-25.9, adult: Secondary | ICD-10-CM | POA: Diagnosis not present

## 2020-02-22 DIAGNOSIS — Q438 Other specified congenital malformations of intestine: Secondary | ICD-10-CM | POA: Diagnosis not present

## 2020-02-22 DIAGNOSIS — I471 Supraventricular tachycardia: Secondary | ICD-10-CM | POA: Diagnosis not present

## 2020-02-22 DIAGNOSIS — J9601 Acute respiratory failure with hypoxia: Secondary | ICD-10-CM | POA: Diagnosis not present

## 2020-02-22 DIAGNOSIS — C799 Secondary malignant neoplasm of unspecified site: Secondary | ICD-10-CM | POA: Diagnosis not present

## 2020-02-22 DIAGNOSIS — C439 Malignant melanoma of skin, unspecified: Secondary | ICD-10-CM | POA: Diagnosis not present

## 2020-02-22 DIAGNOSIS — R636 Underweight: Secondary | ICD-10-CM | POA: Diagnosis not present

## 2020-02-22 DIAGNOSIS — N201 Calculus of ureter: Secondary | ICD-10-CM | POA: Diagnosis not present

## 2020-02-22 DIAGNOSIS — J90 Pleural effusion, not elsewhere classified: Secondary | ICD-10-CM | POA: Diagnosis not present

## 2020-02-22 DIAGNOSIS — I8310 Varicose veins of unspecified lower extremity with inflammation: Secondary | ICD-10-CM | POA: Diagnosis not present

## 2020-02-22 DIAGNOSIS — E038 Other specified hypothyroidism: Secondary | ICD-10-CM | POA: Diagnosis not present

## 2020-02-22 DIAGNOSIS — K644 Residual hemorrhoidal skin tags: Secondary | ICD-10-CM | POA: Diagnosis not present

## 2020-02-23 DIAGNOSIS — Z8489 Family history of other specified conditions: Secondary | ICD-10-CM | POA: Diagnosis not present

## 2020-02-23 DIAGNOSIS — M25622 Stiffness of left elbow, not elsewhere classified: Secondary | ICD-10-CM | POA: Diagnosis not present

## 2020-02-23 DIAGNOSIS — J320 Chronic maxillary sinusitis: Secondary | ICD-10-CM | POA: Diagnosis not present

## 2020-02-23 DIAGNOSIS — I495 Sick sinus syndrome: Secondary | ICD-10-CM | POA: Diagnosis not present

## 2020-02-23 DIAGNOSIS — G5603 Carpal tunnel syndrome, bilateral upper limbs: Secondary | ICD-10-CM | POA: Diagnosis not present

## 2020-02-23 DIAGNOSIS — R339 Retention of urine, unspecified: Secondary | ICD-10-CM | POA: Diagnosis not present

## 2020-02-23 DIAGNOSIS — Z1211 Encounter for screening for malignant neoplasm of colon: Secondary | ICD-10-CM | POA: Diagnosis not present

## 2020-02-23 DIAGNOSIS — M180 Bilateral primary osteoarthritis of first carpometacarpal joints: Secondary | ICD-10-CM | POA: Diagnosis not present

## 2020-02-23 DIAGNOSIS — J301 Allergic rhinitis due to pollen: Secondary | ICD-10-CM | POA: Diagnosis not present

## 2020-02-23 DIAGNOSIS — Z6824 Body mass index (BMI) 24.0-24.9, adult: Secondary | ICD-10-CM | POA: Diagnosis not present

## 2020-02-23 DIAGNOSIS — Z7982 Long term (current) use of aspirin: Secondary | ICD-10-CM | POA: Diagnosis not present

## 2020-02-23 DIAGNOSIS — Z9989 Dependence on other enabling machines and devices: Secondary | ICD-10-CM | POA: Diagnosis not present

## 2020-02-23 DIAGNOSIS — M79675 Pain in left toe(s): Secondary | ICD-10-CM | POA: Diagnosis not present

## 2020-02-23 DIAGNOSIS — D819 Combined immunodeficiency, unspecified: Secondary | ICD-10-CM | POA: Diagnosis not present

## 2020-02-23 DIAGNOSIS — Q762 Congenital spondylolisthesis: Secondary | ICD-10-CM | POA: Diagnosis not present

## 2020-02-23 DIAGNOSIS — E0590 Thyrotoxicosis, unspecified without thyrotoxic crisis or storm: Secondary | ICD-10-CM | POA: Diagnosis not present

## 2020-02-23 DIAGNOSIS — Z4502 Encounter for adjustment and management of automatic implantable cardiac defibrillator: Secondary | ICD-10-CM | POA: Diagnosis not present

## 2020-02-23 DIAGNOSIS — L03116 Cellulitis of left lower limb: Secondary | ICD-10-CM | POA: Diagnosis not present

## 2020-02-23 DIAGNOSIS — I4892 Unspecified atrial flutter: Secondary | ICD-10-CM | POA: Diagnosis not present

## 2020-02-23 DIAGNOSIS — M79652 Pain in left thigh: Secondary | ICD-10-CM | POA: Diagnosis not present

## 2020-02-23 DIAGNOSIS — F0281 Dementia in other diseases classified elsewhere with behavioral disturbance: Secondary | ICD-10-CM | POA: Diagnosis not present

## 2020-02-23 DIAGNOSIS — M47812 Spondylosis without myelopathy or radiculopathy, cervical region: Secondary | ICD-10-CM | POA: Diagnosis not present

## 2020-02-23 DIAGNOSIS — M4312 Spondylolisthesis, cervical region: Secondary | ICD-10-CM | POA: Diagnosis not present

## 2020-02-23 DIAGNOSIS — R030 Elevated blood-pressure reading, without diagnosis of hypertension: Secondary | ICD-10-CM | POA: Diagnosis not present

## 2020-02-23 DIAGNOSIS — F909 Attention-deficit hyperactivity disorder, unspecified type: Secondary | ICD-10-CM | POA: Diagnosis not present

## 2020-02-23 DIAGNOSIS — E1059 Type 1 diabetes mellitus with other circulatory complications: Secondary | ICD-10-CM | POA: Diagnosis not present

## 2020-02-23 DIAGNOSIS — J9610 Chronic respiratory failure, unspecified whether with hypoxia or hypercapnia: Secondary | ICD-10-CM | POA: Diagnosis not present

## 2020-02-23 DIAGNOSIS — R300 Dysuria: Secondary | ICD-10-CM | POA: Diagnosis not present

## 2020-02-23 DIAGNOSIS — M19041 Primary osteoarthritis, right hand: Secondary | ICD-10-CM | POA: Diagnosis not present

## 2020-02-23 DIAGNOSIS — N486 Induration penis plastica: Secondary | ICD-10-CM | POA: Diagnosis not present

## 2020-02-23 DIAGNOSIS — S81801D Unspecified open wound, right lower leg, subsequent encounter: Secondary | ICD-10-CM | POA: Diagnosis not present

## 2020-02-23 DIAGNOSIS — M5386 Other specified dorsopathies, lumbar region: Secondary | ICD-10-CM | POA: Diagnosis not present

## 2020-02-23 DIAGNOSIS — Z933 Colostomy status: Secondary | ICD-10-CM | POA: Diagnosis not present

## 2020-02-23 DIAGNOSIS — E2740 Unspecified adrenocortical insufficiency: Secondary | ICD-10-CM | POA: Diagnosis not present

## 2020-02-23 DIAGNOSIS — R931 Abnormal findings on diagnostic imaging of heart and coronary circulation: Secondary | ICD-10-CM | POA: Diagnosis not present

## 2020-02-23 DIAGNOSIS — S42214A Unspecified nondisplaced fracture of surgical neck of right humerus, initial encounter for closed fracture: Secondary | ICD-10-CM | POA: Diagnosis not present

## 2020-02-23 DIAGNOSIS — E43 Unspecified severe protein-calorie malnutrition: Secondary | ICD-10-CM | POA: Diagnosis not present

## 2020-02-23 DIAGNOSIS — J343 Hypertrophy of nasal turbinates: Secondary | ICD-10-CM | POA: Diagnosis not present

## 2020-02-23 DIAGNOSIS — R251 Tremor, unspecified: Secondary | ICD-10-CM | POA: Diagnosis not present

## 2020-02-23 DIAGNOSIS — T161XXA Foreign body in right ear, initial encounter: Secondary | ICD-10-CM | POA: Diagnosis not present

## 2020-02-23 DIAGNOSIS — Z1389 Encounter for screening for other disorder: Secondary | ICD-10-CM | POA: Diagnosis not present

## 2020-02-23 DIAGNOSIS — K219 Gastro-esophageal reflux disease without esophagitis: Secondary | ICD-10-CM | POA: Diagnosis not present

## 2020-02-23 DIAGNOSIS — M25652 Stiffness of left hip, not elsewhere classified: Secondary | ICD-10-CM | POA: Diagnosis not present

## 2020-02-23 DIAGNOSIS — N10 Acute tubulo-interstitial nephritis: Secondary | ICD-10-CM | POA: Diagnosis not present

## 2020-02-23 DIAGNOSIS — K222 Esophageal obstruction: Secondary | ICD-10-CM | POA: Diagnosis not present

## 2020-02-23 DIAGNOSIS — I25119 Atherosclerotic heart disease of native coronary artery with unspecified angina pectoris: Secondary | ICD-10-CM | POA: Diagnosis not present

## 2020-02-23 DIAGNOSIS — E1159 Type 2 diabetes mellitus with other circulatory complications: Secondary | ICD-10-CM | POA: Diagnosis not present

## 2020-02-23 DIAGNOSIS — M3500 Sicca syndrome, unspecified: Secondary | ICD-10-CM | POA: Diagnosis not present

## 2020-02-23 DIAGNOSIS — R000 Tachycardia, unspecified: Secondary | ICD-10-CM | POA: Diagnosis not present

## 2020-02-23 DIAGNOSIS — F17218 Nicotine dependence, cigarettes, with other nicotine-induced disorders: Secondary | ICD-10-CM | POA: Diagnosis not present

## 2020-02-23 DIAGNOSIS — M25562 Pain in left knee: Secondary | ICD-10-CM | POA: Diagnosis not present

## 2020-02-23 DIAGNOSIS — E519 Thiamine deficiency, unspecified: Secondary | ICD-10-CM | POA: Diagnosis not present

## 2020-02-23 DIAGNOSIS — E1142 Type 2 diabetes mellitus with diabetic polyneuropathy: Secondary | ICD-10-CM | POA: Diagnosis not present

## 2020-02-23 DIAGNOSIS — D2272 Melanocytic nevi of left lower limb, including hip: Secondary | ICD-10-CM | POA: Diagnosis not present

## 2020-02-23 DIAGNOSIS — Z853 Personal history of malignant neoplasm of breast: Secondary | ICD-10-CM | POA: Diagnosis not present

## 2020-02-23 DIAGNOSIS — M85851 Other specified disorders of bone density and structure, right thigh: Secondary | ICD-10-CM | POA: Diagnosis not present

## 2020-02-23 DIAGNOSIS — N138 Other obstructive and reflux uropathy: Secondary | ICD-10-CM | POA: Diagnosis not present

## 2020-02-23 DIAGNOSIS — J9611 Chronic respiratory failure with hypoxia: Secondary | ICD-10-CM | POA: Diagnosis not present

## 2020-02-23 DIAGNOSIS — M25761 Osteophyte, right knee: Secondary | ICD-10-CM | POA: Diagnosis not present

## 2020-02-23 DIAGNOSIS — M9902 Segmental and somatic dysfunction of thoracic region: Secondary | ICD-10-CM | POA: Diagnosis not present

## 2020-02-23 DIAGNOSIS — C61 Malignant neoplasm of prostate: Secondary | ICD-10-CM | POA: Diagnosis not present

## 2020-02-23 DIAGNOSIS — M545 Low back pain: Secondary | ICD-10-CM | POA: Diagnosis not present

## 2020-02-23 DIAGNOSIS — Z510 Encounter for antineoplastic radiation therapy: Secondary | ICD-10-CM | POA: Diagnosis not present

## 2020-02-23 DIAGNOSIS — T8452XA Infection and inflammatory reaction due to internal left hip prosthesis, initial encounter: Secondary | ICD-10-CM | POA: Diagnosis not present

## 2020-02-23 DIAGNOSIS — Z83511 Family history of glaucoma: Secondary | ICD-10-CM | POA: Diagnosis not present

## 2020-02-23 DIAGNOSIS — S2222XD Fracture of body of sternum, subsequent encounter for fracture with routine healing: Secondary | ICD-10-CM | POA: Diagnosis not present

## 2020-02-23 DIAGNOSIS — B351 Tinea unguium: Secondary | ICD-10-CM | POA: Diagnosis not present

## 2020-02-23 DIAGNOSIS — M2170 Unequal limb length (acquired), unspecified site: Secondary | ICD-10-CM | POA: Diagnosis not present

## 2020-02-23 DIAGNOSIS — N390 Urinary tract infection, site not specified: Secondary | ICD-10-CM | POA: Diagnosis not present

## 2020-02-23 DIAGNOSIS — M7021 Olecranon bursitis, right elbow: Secondary | ICD-10-CM | POA: Diagnosis not present

## 2020-02-23 DIAGNOSIS — R45850 Homicidal ideations: Secondary | ICD-10-CM | POA: Diagnosis not present

## 2020-02-23 DIAGNOSIS — I700 Atherosclerosis of aorta: Secondary | ICD-10-CM | POA: Diagnosis not present

## 2020-02-23 DIAGNOSIS — R768 Other specified abnormal immunological findings in serum: Secondary | ICD-10-CM | POA: Diagnosis not present

## 2020-02-23 DIAGNOSIS — Z1231 Encounter for screening mammogram for malignant neoplasm of breast: Secondary | ICD-10-CM | POA: Diagnosis not present

## 2020-02-23 DIAGNOSIS — R072 Precordial pain: Secondary | ICD-10-CM | POA: Diagnosis not present

## 2020-02-23 DIAGNOSIS — N1831 Chronic kidney disease, stage 3a: Secondary | ICD-10-CM | POA: Diagnosis not present

## 2020-02-23 DIAGNOSIS — R3914 Feeling of incomplete bladder emptying: Secondary | ICD-10-CM | POA: Diagnosis not present

## 2020-02-23 DIAGNOSIS — L97929 Non-pressure chronic ulcer of unspecified part of left lower leg with unspecified severity: Secondary | ICD-10-CM | POA: Diagnosis not present

## 2020-02-23 DIAGNOSIS — N83201 Unspecified ovarian cyst, right side: Secondary | ICD-10-CM | POA: Diagnosis not present

## 2020-02-23 DIAGNOSIS — R32 Unspecified urinary incontinence: Secondary | ICD-10-CM | POA: Diagnosis not present

## 2020-02-23 DIAGNOSIS — Z20828 Contact with and (suspected) exposure to other viral communicable diseases: Secondary | ICD-10-CM | POA: Diagnosis not present

## 2020-02-23 DIAGNOSIS — R41841 Cognitive communication deficit: Secondary | ICD-10-CM | POA: Diagnosis not present

## 2020-02-23 DIAGNOSIS — E531 Pyridoxine deficiency: Secondary | ICD-10-CM | POA: Diagnosis not present

## 2020-02-23 DIAGNOSIS — Z13820 Encounter for screening for osteoporosis: Secondary | ICD-10-CM | POA: Diagnosis not present

## 2020-02-23 DIAGNOSIS — M5431 Sciatica, right side: Secondary | ICD-10-CM | POA: Diagnosis not present

## 2020-02-23 DIAGNOSIS — R002 Palpitations: Secondary | ICD-10-CM | POA: Diagnosis not present

## 2020-02-23 DIAGNOSIS — I272 Pulmonary hypertension, unspecified: Secondary | ICD-10-CM | POA: Diagnosis not present

## 2020-02-23 DIAGNOSIS — M7501 Adhesive capsulitis of right shoulder: Secondary | ICD-10-CM | POA: Diagnosis not present

## 2020-02-23 DIAGNOSIS — Z806 Family history of leukemia: Secondary | ICD-10-CM | POA: Diagnosis not present

## 2020-02-23 DIAGNOSIS — C7A091 Malignant carcinoid tumor of the thymus: Secondary | ICD-10-CM | POA: Diagnosis not present

## 2020-02-23 DIAGNOSIS — C652 Malignant neoplasm of left renal pelvis: Secondary | ICD-10-CM | POA: Diagnosis not present

## 2020-02-23 DIAGNOSIS — H16102 Unspecified superficial keratitis, left eye: Secondary | ICD-10-CM | POA: Diagnosis not present

## 2020-02-23 DIAGNOSIS — N201 Calculus of ureter: Secondary | ICD-10-CM | POA: Diagnosis not present

## 2020-02-23 DIAGNOSIS — D1801 Hemangioma of skin and subcutaneous tissue: Secondary | ICD-10-CM | POA: Diagnosis not present

## 2020-02-23 DIAGNOSIS — M8589 Other specified disorders of bone density and structure, multiple sites: Secondary | ICD-10-CM | POA: Diagnosis not present

## 2020-02-23 DIAGNOSIS — N281 Cyst of kidney, acquired: Secondary | ICD-10-CM | POA: Diagnosis not present

## 2020-02-23 DIAGNOSIS — G8220 Paraplegia, unspecified: Secondary | ICD-10-CM | POA: Diagnosis not present

## 2020-02-23 DIAGNOSIS — Z79899 Other long term (current) drug therapy: Secondary | ICD-10-CM | POA: Diagnosis not present

## 2020-02-23 DIAGNOSIS — I501 Left ventricular failure: Secondary | ICD-10-CM | POA: Diagnosis not present

## 2020-02-23 DIAGNOSIS — M5134 Other intervertebral disc degeneration, thoracic region: Secondary | ICD-10-CM | POA: Diagnosis not present

## 2020-02-23 DIAGNOSIS — L812 Freckles: Secondary | ICD-10-CM | POA: Diagnosis not present

## 2020-02-23 DIAGNOSIS — E109 Type 1 diabetes mellitus without complications: Secondary | ICD-10-CM | POA: Diagnosis not present

## 2020-02-23 DIAGNOSIS — M5414 Radiculopathy, thoracic region: Secondary | ICD-10-CM | POA: Diagnosis not present

## 2020-02-23 DIAGNOSIS — C50812 Malignant neoplasm of overlapping sites of left female breast: Secondary | ICD-10-CM | POA: Diagnosis not present

## 2020-02-23 DIAGNOSIS — R221 Localized swelling, mass and lump, neck: Secondary | ICD-10-CM | POA: Diagnosis not present

## 2020-02-23 DIAGNOSIS — S62622D Displaced fracture of medial phalanx of right middle finger, subsequent encounter for fracture with routine healing: Secondary | ICD-10-CM | POA: Diagnosis not present

## 2020-02-23 DIAGNOSIS — M25561 Pain in right knee: Secondary | ICD-10-CM | POA: Diagnosis not present

## 2020-02-23 DIAGNOSIS — D1800 Hemangioma unspecified site: Secondary | ICD-10-CM | POA: Diagnosis not present

## 2020-02-23 DIAGNOSIS — M79606 Pain in leg, unspecified: Secondary | ICD-10-CM | POA: Diagnosis not present

## 2020-02-23 DIAGNOSIS — Z01812 Encounter for preprocedural laboratory examination: Secondary | ICD-10-CM | POA: Diagnosis not present

## 2020-02-23 DIAGNOSIS — Z6833 Body mass index (BMI) 33.0-33.9, adult: Secondary | ICD-10-CM | POA: Diagnosis not present

## 2020-02-23 DIAGNOSIS — M2241 Chondromalacia patellae, right knee: Secondary | ICD-10-CM | POA: Diagnosis not present

## 2020-02-23 DIAGNOSIS — G3184 Mild cognitive impairment, so stated: Secondary | ICD-10-CM | POA: Diagnosis not present

## 2020-02-23 DIAGNOSIS — J984 Other disorders of lung: Secondary | ICD-10-CM | POA: Diagnosis not present

## 2020-02-23 DIAGNOSIS — F0280 Dementia in other diseases classified elsewhere without behavioral disturbance: Secondary | ICD-10-CM | POA: Diagnosis not present

## 2020-02-23 DIAGNOSIS — F3181 Bipolar II disorder: Secondary | ICD-10-CM | POA: Diagnosis not present

## 2020-02-23 DIAGNOSIS — D510 Vitamin B12 deficiency anemia due to intrinsic factor deficiency: Secondary | ICD-10-CM | POA: Diagnosis not present

## 2020-02-23 DIAGNOSIS — I350 Nonrheumatic aortic (valve) stenosis: Secondary | ICD-10-CM | POA: Diagnosis not present

## 2020-02-23 DIAGNOSIS — M21371 Foot drop, right foot: Secondary | ICD-10-CM | POA: Diagnosis not present

## 2020-02-23 DIAGNOSIS — F1021 Alcohol dependence, in remission: Secondary | ICD-10-CM | POA: Diagnosis not present

## 2020-02-23 DIAGNOSIS — N3000 Acute cystitis without hematuria: Secondary | ICD-10-CM | POA: Diagnosis not present

## 2020-02-23 DIAGNOSIS — C189 Malignant neoplasm of colon, unspecified: Secondary | ICD-10-CM | POA: Diagnosis not present

## 2020-02-23 DIAGNOSIS — R7309 Other abnormal glucose: Secondary | ICD-10-CM | POA: Diagnosis not present

## 2020-02-23 DIAGNOSIS — M5022 Other cervical disc displacement, mid-cervical region, unspecified level: Secondary | ICD-10-CM | POA: Diagnosis not present

## 2020-02-23 DIAGNOSIS — C651 Malignant neoplasm of right renal pelvis: Secondary | ICD-10-CM | POA: Diagnosis not present

## 2020-02-23 DIAGNOSIS — J438 Other emphysema: Secondary | ICD-10-CM | POA: Diagnosis not present

## 2020-02-23 DIAGNOSIS — R0982 Postnasal drip: Secondary | ICD-10-CM | POA: Diagnosis not present

## 2020-02-23 DIAGNOSIS — G4489 Other headache syndrome: Secondary | ICD-10-CM | POA: Diagnosis not present

## 2020-02-23 DIAGNOSIS — L819 Disorder of pigmentation, unspecified: Secondary | ICD-10-CM | POA: Diagnosis not present

## 2020-02-23 DIAGNOSIS — E781 Pure hyperglyceridemia: Secondary | ICD-10-CM | POA: Diagnosis not present

## 2020-02-23 DIAGNOSIS — Z6839 Body mass index (BMI) 39.0-39.9, adult: Secondary | ICD-10-CM | POA: Diagnosis not present

## 2020-02-23 DIAGNOSIS — Z20822 Contact with and (suspected) exposure to covid-19: Secondary | ICD-10-CM | POA: Diagnosis not present

## 2020-02-23 DIAGNOSIS — I83813 Varicose veins of bilateral lower extremities with pain: Secondary | ICD-10-CM | POA: Diagnosis not present

## 2020-02-23 DIAGNOSIS — Z2239 Carrier of other specified bacterial diseases: Secondary | ICD-10-CM | POA: Diagnosis not present

## 2020-02-23 DIAGNOSIS — R140 Abdominal distension (gaseous): Secondary | ICD-10-CM | POA: Diagnosis not present

## 2020-02-23 DIAGNOSIS — R634 Abnormal weight loss: Secondary | ICD-10-CM | POA: Diagnosis not present

## 2020-02-23 DIAGNOSIS — H9313 Tinnitus, bilateral: Secondary | ICD-10-CM | POA: Diagnosis not present

## 2020-02-23 DIAGNOSIS — E559 Vitamin D deficiency, unspecified: Secondary | ICD-10-CM | POA: Diagnosis not present

## 2020-02-23 DIAGNOSIS — K5904 Chronic idiopathic constipation: Secondary | ICD-10-CM | POA: Diagnosis not present

## 2020-02-23 DIAGNOSIS — H401111 Primary open-angle glaucoma, right eye, mild stage: Secondary | ICD-10-CM | POA: Diagnosis not present

## 2020-02-23 DIAGNOSIS — H44422 Hypotony of left eye due to ocular fistula: Secondary | ICD-10-CM | POA: Diagnosis not present

## 2020-02-23 DIAGNOSIS — H00035 Abscess of left lower eyelid: Secondary | ICD-10-CM | POA: Diagnosis not present

## 2020-02-23 DIAGNOSIS — H353211 Exudative age-related macular degeneration, right eye, with active choroidal neovascularization: Secondary | ICD-10-CM | POA: Diagnosis not present

## 2020-02-23 DIAGNOSIS — H401123 Primary open-angle glaucoma, left eye, severe stage: Secondary | ICD-10-CM | POA: Diagnosis not present

## 2020-02-23 DIAGNOSIS — Z9181 History of falling: Secondary | ICD-10-CM | POA: Diagnosis not present

## 2020-02-23 DIAGNOSIS — M8008XD Age-related osteoporosis with current pathological fracture, vertebra(e), subsequent encounter for fracture with routine healing: Secondary | ICD-10-CM | POA: Diagnosis not present

## 2020-02-23 DIAGNOSIS — E291 Testicular hypofunction: Secondary | ICD-10-CM | POA: Diagnosis not present

## 2020-02-23 DIAGNOSIS — Z791 Long term (current) use of non-steroidal anti-inflammatories (NSAID): Secondary | ICD-10-CM | POA: Diagnosis not present

## 2020-02-23 DIAGNOSIS — M654 Radial styloid tenosynovitis [de Quervain]: Secondary | ICD-10-CM | POA: Diagnosis not present

## 2020-02-23 DIAGNOSIS — W1839XA Other fall on same level, initial encounter: Secondary | ICD-10-CM | POA: Diagnosis not present

## 2020-02-23 DIAGNOSIS — N5231 Erectile dysfunction following radical prostatectomy: Secondary | ICD-10-CM | POA: Diagnosis not present

## 2020-02-23 DIAGNOSIS — I2699 Other pulmonary embolism without acute cor pulmonale: Secondary | ICD-10-CM | POA: Diagnosis not present

## 2020-02-23 DIAGNOSIS — J209 Acute bronchitis, unspecified: Secondary | ICD-10-CM | POA: Diagnosis not present

## 2020-02-23 DIAGNOSIS — Z96652 Presence of left artificial knee joint: Secondary | ICD-10-CM | POA: Diagnosis not present

## 2020-02-23 DIAGNOSIS — E785 Hyperlipidemia, unspecified: Secondary | ICD-10-CM | POA: Diagnosis not present

## 2020-02-23 DIAGNOSIS — B372 Candidiasis of skin and nail: Secondary | ICD-10-CM | POA: Diagnosis not present

## 2020-02-23 DIAGNOSIS — K279 Peptic ulcer, site unspecified, unspecified as acute or chronic, without hemorrhage or perforation: Secondary | ICD-10-CM | POA: Diagnosis not present

## 2020-02-23 DIAGNOSIS — S300XXA Contusion of lower back and pelvis, initial encounter: Secondary | ICD-10-CM | POA: Diagnosis not present

## 2020-02-23 DIAGNOSIS — N202 Calculus of kidney with calculus of ureter: Secondary | ICD-10-CM | POA: Diagnosis not present

## 2020-02-23 DIAGNOSIS — R0789 Other chest pain: Secondary | ICD-10-CM | POA: Diagnosis not present

## 2020-02-23 DIAGNOSIS — R972 Elevated prostate specific antigen [PSA]: Secondary | ICD-10-CM | POA: Diagnosis not present

## 2020-02-23 DIAGNOSIS — I119 Hypertensive heart disease without heart failure: Secondary | ICD-10-CM | POA: Diagnosis not present

## 2020-02-23 DIAGNOSIS — M19042 Primary osteoarthritis, left hand: Secondary | ICD-10-CM | POA: Diagnosis not present

## 2020-02-23 DIAGNOSIS — B379 Candidiasis, unspecified: Secondary | ICD-10-CM | POA: Diagnosis not present

## 2020-02-23 DIAGNOSIS — Z800 Family history of malignant neoplasm of digestive organs: Secondary | ICD-10-CM | POA: Diagnosis not present

## 2020-02-23 DIAGNOSIS — M25473 Effusion, unspecified ankle: Secondary | ICD-10-CM | POA: Diagnosis not present

## 2020-02-23 DIAGNOSIS — N170 Acute kidney failure with tubular necrosis: Secondary | ICD-10-CM | POA: Diagnosis not present

## 2020-02-23 DIAGNOSIS — G8929 Other chronic pain: Secondary | ICD-10-CM | POA: Diagnosis not present

## 2020-02-23 DIAGNOSIS — H0102B Squamous blepharitis left eye, upper and lower eyelids: Secondary | ICD-10-CM | POA: Diagnosis not present

## 2020-02-23 DIAGNOSIS — M75111 Incomplete rotator cuff tear or rupture of right shoulder, not specified as traumatic: Secondary | ICD-10-CM | POA: Diagnosis not present

## 2020-02-23 DIAGNOSIS — H43811 Vitreous degeneration, right eye: Secondary | ICD-10-CM | POA: Diagnosis not present

## 2020-02-23 DIAGNOSIS — F429 Obsessive-compulsive disorder, unspecified: Secondary | ICD-10-CM | POA: Diagnosis not present

## 2020-02-23 DIAGNOSIS — H5203 Hypermetropia, bilateral: Secondary | ICD-10-CM | POA: Diagnosis not present

## 2020-02-23 DIAGNOSIS — M7061 Trochanteric bursitis, right hip: Secondary | ICD-10-CM | POA: Diagnosis not present

## 2020-02-23 DIAGNOSIS — Z86012 Personal history of benign carcinoid tumor: Secondary | ICD-10-CM | POA: Diagnosis not present

## 2020-02-23 DIAGNOSIS — M06032 Rheumatoid arthritis without rheumatoid factor, left wrist: Secondary | ICD-10-CM | POA: Diagnosis not present

## 2020-02-23 DIAGNOSIS — C44712 Basal cell carcinoma of skin of right lower limb, including hip: Secondary | ICD-10-CM | POA: Diagnosis not present

## 2020-02-23 DIAGNOSIS — J45909 Unspecified asthma, uncomplicated: Secondary | ICD-10-CM | POA: Diagnosis not present

## 2020-02-23 DIAGNOSIS — Z85118 Personal history of other malignant neoplasm of bronchus and lung: Secondary | ICD-10-CM | POA: Diagnosis not present

## 2020-02-23 DIAGNOSIS — Z85850 Personal history of malignant neoplasm of thyroid: Secondary | ICD-10-CM | POA: Diagnosis not present

## 2020-02-23 DIAGNOSIS — R918 Other nonspecific abnormal finding of lung field: Secondary | ICD-10-CM | POA: Diagnosis not present

## 2020-02-23 DIAGNOSIS — K3531 Acute appendicitis with localized peritonitis and gangrene, without perforation: Secondary | ICD-10-CM | POA: Diagnosis not present

## 2020-02-23 DIAGNOSIS — I509 Heart failure, unspecified: Secondary | ICD-10-CM | POA: Diagnosis not present

## 2020-02-23 DIAGNOSIS — D72820 Lymphocytosis (symptomatic): Secondary | ICD-10-CM | POA: Diagnosis not present

## 2020-02-23 DIAGNOSIS — I452 Bifascicular block: Secondary | ICD-10-CM | POA: Diagnosis not present

## 2020-02-23 DIAGNOSIS — M25411 Effusion, right shoulder: Secondary | ICD-10-CM | POA: Diagnosis not present

## 2020-02-23 DIAGNOSIS — H353221 Exudative age-related macular degeneration, left eye, with active choroidal neovascularization: Secondary | ICD-10-CM | POA: Diagnosis not present

## 2020-02-23 DIAGNOSIS — R490 Dysphonia: Secondary | ICD-10-CM | POA: Diagnosis not present

## 2020-02-23 DIAGNOSIS — K37 Unspecified appendicitis: Secondary | ICD-10-CM | POA: Diagnosis not present

## 2020-02-23 DIAGNOSIS — J3089 Other allergic rhinitis: Secondary | ICD-10-CM | POA: Diagnosis not present

## 2020-02-23 DIAGNOSIS — Z7984 Long term (current) use of oral hypoglycemic drugs: Secondary | ICD-10-CM | POA: Diagnosis not present

## 2020-02-23 DIAGNOSIS — M7062 Trochanteric bursitis, left hip: Secondary | ICD-10-CM | POA: Diagnosis not present

## 2020-02-23 DIAGNOSIS — Z96641 Presence of right artificial hip joint: Secondary | ICD-10-CM | POA: Diagnosis not present

## 2020-02-23 DIAGNOSIS — M67912 Unspecified disorder of synovium and tendon, left shoulder: Secondary | ICD-10-CM | POA: Diagnosis not present

## 2020-02-23 DIAGNOSIS — M5410 Radiculopathy, site unspecified: Secondary | ICD-10-CM | POA: Diagnosis not present

## 2020-02-23 DIAGNOSIS — D751 Secondary polycythemia: Secondary | ICD-10-CM | POA: Diagnosis not present

## 2020-02-23 DIAGNOSIS — N6489 Other specified disorders of breast: Secondary | ICD-10-CM | POA: Diagnosis not present

## 2020-02-23 DIAGNOSIS — I2583 Coronary atherosclerosis due to lipid rich plaque: Secondary | ICD-10-CM | POA: Diagnosis not present

## 2020-02-23 DIAGNOSIS — B399 Histoplasmosis, unspecified: Secondary | ICD-10-CM | POA: Diagnosis not present

## 2020-02-23 DIAGNOSIS — R509 Fever, unspecified: Secondary | ICD-10-CM | POA: Diagnosis not present

## 2020-02-23 DIAGNOSIS — Z6836 Body mass index (BMI) 36.0-36.9, adult: Secondary | ICD-10-CM | POA: Diagnosis not present

## 2020-02-23 DIAGNOSIS — R739 Hyperglycemia, unspecified: Secondary | ICD-10-CM | POA: Diagnosis not present

## 2020-02-23 DIAGNOSIS — L97901 Non-pressure chronic ulcer of unspecified part of unspecified lower leg limited to breakdown of skin: Secondary | ICD-10-CM | POA: Diagnosis not present

## 2020-02-23 DIAGNOSIS — L570 Actinic keratosis: Secondary | ICD-10-CM | POA: Diagnosis not present

## 2020-02-23 DIAGNOSIS — H40033 Anatomical narrow angle, bilateral: Secondary | ICD-10-CM | POA: Diagnosis not present

## 2020-02-23 DIAGNOSIS — Z9981 Dependence on supplemental oxygen: Secondary | ICD-10-CM | POA: Diagnosis not present

## 2020-02-23 DIAGNOSIS — Z1331 Encounter for screening for depression: Secondary | ICD-10-CM | POA: Diagnosis not present

## 2020-02-23 DIAGNOSIS — N3946 Mixed incontinence: Secondary | ICD-10-CM | POA: Diagnosis not present

## 2020-02-23 DIAGNOSIS — R7302 Impaired glucose tolerance (oral): Secondary | ICD-10-CM | POA: Diagnosis not present

## 2020-02-23 DIAGNOSIS — Q383 Other congenital malformations of tongue: Secondary | ICD-10-CM | POA: Diagnosis not present

## 2020-02-23 DIAGNOSIS — S72141D Displaced intertrochanteric fracture of right femur, subsequent encounter for closed fracture with routine healing: Secondary | ICD-10-CM | POA: Diagnosis not present

## 2020-02-23 DIAGNOSIS — Z5321 Procedure and treatment not carried out due to patient leaving prior to being seen by health care provider: Secondary | ICD-10-CM | POA: Diagnosis not present

## 2020-02-23 DIAGNOSIS — H3530 Unspecified macular degeneration: Secondary | ICD-10-CM | POA: Diagnosis not present

## 2020-02-23 DIAGNOSIS — I4819 Other persistent atrial fibrillation: Secondary | ICD-10-CM | POA: Diagnosis not present

## 2020-02-23 DIAGNOSIS — Z8673 Personal history of transient ischemic attack (TIA), and cerebral infarction without residual deficits: Secondary | ICD-10-CM | POA: Diagnosis not present

## 2020-02-23 DIAGNOSIS — H6123 Impacted cerumen, bilateral: Secondary | ICD-10-CM | POA: Diagnosis not present

## 2020-02-23 DIAGNOSIS — Z856 Personal history of leukemia: Secondary | ICD-10-CM | POA: Diagnosis not present

## 2020-02-23 DIAGNOSIS — D0510 Intraductal carcinoma in situ of unspecified breast: Secondary | ICD-10-CM | POA: Diagnosis not present

## 2020-02-23 DIAGNOSIS — H31011 Macula scars of posterior pole (postinflammatory) (post-traumatic), right eye: Secondary | ICD-10-CM | POA: Diagnosis not present

## 2020-02-23 DIAGNOSIS — Z881 Allergy status to other antibiotic agents status: Secondary | ICD-10-CM | POA: Diagnosis not present

## 2020-02-23 DIAGNOSIS — S0181XA Laceration without foreign body of other part of head, initial encounter: Secondary | ICD-10-CM | POA: Diagnosis not present

## 2020-02-23 DIAGNOSIS — Z634 Disappearance and death of family member: Secondary | ICD-10-CM | POA: Diagnosis not present

## 2020-02-23 DIAGNOSIS — G9340 Encephalopathy, unspecified: Secondary | ICD-10-CM | POA: Diagnosis not present

## 2020-02-23 DIAGNOSIS — C3431 Malignant neoplasm of lower lobe, right bronchus or lung: Secondary | ICD-10-CM | POA: Diagnosis not present

## 2020-02-23 DIAGNOSIS — R1310 Dysphagia, unspecified: Secondary | ICD-10-CM | POA: Diagnosis not present

## 2020-02-23 DIAGNOSIS — I959 Hypotension, unspecified: Secondary | ICD-10-CM | POA: Diagnosis not present

## 2020-02-23 DIAGNOSIS — R310 Gross hematuria: Secondary | ICD-10-CM | POA: Diagnosis not present

## 2020-02-23 DIAGNOSIS — Z8551 Personal history of malignant neoplasm of bladder: Secondary | ICD-10-CM | POA: Diagnosis not present

## 2020-02-23 DIAGNOSIS — J310 Chronic rhinitis: Secondary | ICD-10-CM | POA: Diagnosis not present

## 2020-02-23 DIAGNOSIS — D2271 Melanocytic nevi of right lower limb, including hip: Secondary | ICD-10-CM | POA: Diagnosis not present

## 2020-02-23 DIAGNOSIS — C779 Secondary and unspecified malignant neoplasm of lymph node, unspecified: Secondary | ICD-10-CM | POA: Diagnosis not present

## 2020-02-23 DIAGNOSIS — R1031 Right lower quadrant pain: Secondary | ICD-10-CM | POA: Diagnosis not present

## 2020-02-23 DIAGNOSIS — Z6821 Body mass index (BMI) 21.0-21.9, adult: Secondary | ICD-10-CM | POA: Diagnosis not present

## 2020-02-23 DIAGNOSIS — L4050 Arthropathic psoriasis, unspecified: Secondary | ICD-10-CM | POA: Diagnosis not present

## 2020-02-23 DIAGNOSIS — B029 Zoster without complications: Secondary | ICD-10-CM | POA: Diagnosis not present

## 2020-02-23 DIAGNOSIS — S72401G Unspecified fracture of lower end of right femur, subsequent encounter for closed fracture with delayed healing: Secondary | ICD-10-CM | POA: Diagnosis not present

## 2020-02-23 DIAGNOSIS — L980 Pyogenic granuloma: Secondary | ICD-10-CM | POA: Diagnosis not present

## 2020-02-23 DIAGNOSIS — K580 Irritable bowel syndrome with diarrhea: Secondary | ICD-10-CM | POA: Diagnosis not present

## 2020-02-23 DIAGNOSIS — M8448XD Pathological fracture, other site, subsequent encounter for fracture with routine healing: Secondary | ICD-10-CM | POA: Diagnosis not present

## 2020-02-23 DIAGNOSIS — H353231 Exudative age-related macular degeneration, bilateral, with active choroidal neovascularization: Secondary | ICD-10-CM | POA: Diagnosis not present

## 2020-02-23 DIAGNOSIS — M86172 Other acute osteomyelitis, left ankle and foot: Secondary | ICD-10-CM | POA: Diagnosis not present

## 2020-02-23 DIAGNOSIS — R930 Abnormal findings on diagnostic imaging of skull and head, not elsewhere classified: Secondary | ICD-10-CM | POA: Diagnosis not present

## 2020-02-23 DIAGNOSIS — L814 Other melanin hyperpigmentation: Secondary | ICD-10-CM | POA: Diagnosis not present

## 2020-02-23 DIAGNOSIS — H52223 Regular astigmatism, bilateral: Secondary | ICD-10-CM | POA: Diagnosis not present

## 2020-02-23 DIAGNOSIS — R87810 Cervical high risk human papillomavirus (HPV) DNA test positive: Secondary | ICD-10-CM | POA: Diagnosis not present

## 2020-02-23 DIAGNOSIS — G609 Hereditary and idiopathic neuropathy, unspecified: Secondary | ICD-10-CM | POA: Diagnosis not present

## 2020-02-23 DIAGNOSIS — R569 Unspecified convulsions: Secondary | ICD-10-CM | POA: Diagnosis not present

## 2020-02-23 DIAGNOSIS — K743 Primary biliary cirrhosis: Secondary | ICD-10-CM | POA: Diagnosis not present

## 2020-02-23 DIAGNOSIS — Z9049 Acquired absence of other specified parts of digestive tract: Secondary | ICD-10-CM | POA: Diagnosis not present

## 2020-02-23 DIAGNOSIS — F688 Other specified disorders of adult personality and behavior: Secondary | ICD-10-CM | POA: Diagnosis not present

## 2020-02-23 DIAGNOSIS — I130 Hypertensive heart and chronic kidney disease with heart failure and stage 1 through stage 4 chronic kidney disease, or unspecified chronic kidney disease: Secondary | ICD-10-CM | POA: Diagnosis not present

## 2020-02-23 DIAGNOSIS — M1A00X0 Idiopathic chronic gout, unspecified site, without tophus (tophi): Secondary | ICD-10-CM | POA: Diagnosis not present

## 2020-02-23 DIAGNOSIS — M79643 Pain in unspecified hand: Secondary | ICD-10-CM | POA: Diagnosis not present

## 2020-02-23 DIAGNOSIS — D62 Acute posthemorrhagic anemia: Secondary | ICD-10-CM | POA: Diagnosis not present

## 2020-02-23 DIAGNOSIS — Q438 Other specified congenital malformations of intestine: Secondary | ICD-10-CM | POA: Diagnosis not present

## 2020-02-23 DIAGNOSIS — I779 Disorder of arteries and arterioles, unspecified: Secondary | ICD-10-CM | POA: Diagnosis not present

## 2020-02-23 DIAGNOSIS — Z85818 Personal history of malignant neoplasm of other sites of lip, oral cavity, and pharynx: Secondary | ICD-10-CM | POA: Diagnosis not present

## 2020-02-23 DIAGNOSIS — C44519 Basal cell carcinoma of skin of other part of trunk: Secondary | ICD-10-CM | POA: Diagnosis not present

## 2020-02-23 DIAGNOSIS — Z7951 Long term (current) use of inhaled steroids: Secondary | ICD-10-CM | POA: Diagnosis not present

## 2020-02-23 DIAGNOSIS — M79621 Pain in right upper arm: Secondary | ICD-10-CM | POA: Diagnosis not present

## 2020-02-23 DIAGNOSIS — H2511 Age-related nuclear cataract, right eye: Secondary | ICD-10-CM | POA: Diagnosis not present

## 2020-02-23 DIAGNOSIS — Z940 Kidney transplant status: Secondary | ICD-10-CM | POA: Diagnosis not present

## 2020-02-23 DIAGNOSIS — R3915 Urgency of urination: Secondary | ICD-10-CM | POA: Diagnosis not present

## 2020-02-23 DIAGNOSIS — E1169 Type 2 diabetes mellitus with other specified complication: Secondary | ICD-10-CM | POA: Diagnosis not present

## 2020-02-23 DIAGNOSIS — H3562 Retinal hemorrhage, left eye: Secondary | ICD-10-CM | POA: Diagnosis not present

## 2020-02-23 DIAGNOSIS — F321 Major depressive disorder, single episode, moderate: Secondary | ICD-10-CM | POA: Diagnosis not present

## 2020-02-23 DIAGNOSIS — Z1339 Encounter for screening examination for other mental health and behavioral disorders: Secondary | ICD-10-CM | POA: Diagnosis not present

## 2020-02-23 DIAGNOSIS — E1121 Type 2 diabetes mellitus with diabetic nephropathy: Secondary | ICD-10-CM | POA: Diagnosis not present

## 2020-02-23 DIAGNOSIS — H04203 Unspecified epiphora, bilateral lacrimal glands: Secondary | ICD-10-CM | POA: Diagnosis not present

## 2020-02-23 DIAGNOSIS — I6930 Unspecified sequelae of cerebral infarction: Secondary | ICD-10-CM | POA: Diagnosis not present

## 2020-02-23 DIAGNOSIS — Z3009 Encounter for other general counseling and advice on contraception: Secondary | ICD-10-CM | POA: Diagnosis not present

## 2020-02-23 DIAGNOSIS — R109 Unspecified abdominal pain: Secondary | ICD-10-CM | POA: Diagnosis not present

## 2020-02-23 DIAGNOSIS — K769 Liver disease, unspecified: Secondary | ICD-10-CM | POA: Diagnosis not present

## 2020-02-23 DIAGNOSIS — F0390 Unspecified dementia without behavioral disturbance: Secondary | ICD-10-CM | POA: Diagnosis not present

## 2020-02-23 DIAGNOSIS — J432 Centrilobular emphysema: Secondary | ICD-10-CM | POA: Diagnosis not present

## 2020-02-23 DIAGNOSIS — K353 Acute appendicitis with localized peritonitis, without perforation or gangrene: Secondary | ICD-10-CM | POA: Diagnosis not present

## 2020-02-23 DIAGNOSIS — H40011 Open angle with borderline findings, low risk, right eye: Secondary | ICD-10-CM | POA: Diagnosis not present

## 2020-02-23 DIAGNOSIS — E119 Type 2 diabetes mellitus without complications: Secondary | ICD-10-CM | POA: Diagnosis not present

## 2020-02-23 DIAGNOSIS — Z8261 Family history of arthritis: Secondary | ICD-10-CM | POA: Diagnosis not present

## 2020-02-23 DIAGNOSIS — Z7952 Long term (current) use of systemic steroids: Secondary | ICD-10-CM | POA: Diagnosis not present

## 2020-02-23 DIAGNOSIS — G5622 Lesion of ulnar nerve, left upper limb: Secondary | ICD-10-CM | POA: Diagnosis not present

## 2020-02-23 DIAGNOSIS — B957 Other staphylococcus as the cause of diseases classified elsewhere: Secondary | ICD-10-CM | POA: Diagnosis not present

## 2020-02-23 DIAGNOSIS — M25571 Pain in right ankle and joints of right foot: Secondary | ICD-10-CM | POA: Diagnosis not present

## 2020-02-23 DIAGNOSIS — K760 Fatty (change of) liver, not elsewhere classified: Secondary | ICD-10-CM | POA: Diagnosis not present

## 2020-02-23 DIAGNOSIS — I491 Atrial premature depolarization: Secondary | ICD-10-CM | POA: Diagnosis not present

## 2020-02-23 DIAGNOSIS — Z9889 Other specified postprocedural states: Secondary | ICD-10-CM | POA: Diagnosis not present

## 2020-02-23 DIAGNOSIS — E1165 Type 2 diabetes mellitus with hyperglycemia: Secondary | ICD-10-CM | POA: Diagnosis not present

## 2020-02-23 DIAGNOSIS — E1122 Type 2 diabetes mellitus with diabetic chronic kidney disease: Secondary | ICD-10-CM | POA: Diagnosis not present

## 2020-02-23 DIAGNOSIS — C44229 Squamous cell carcinoma of skin of left ear and external auricular canal: Secondary | ICD-10-CM | POA: Diagnosis not present

## 2020-02-23 DIAGNOSIS — E039 Hypothyroidism, unspecified: Secondary | ICD-10-CM | POA: Diagnosis not present

## 2020-02-23 DIAGNOSIS — R1313 Dysphagia, pharyngeal phase: Secondary | ICD-10-CM | POA: Diagnosis not present

## 2020-02-23 DIAGNOSIS — G500 Trigeminal neuralgia: Secondary | ICD-10-CM | POA: Diagnosis not present

## 2020-02-23 DIAGNOSIS — N400 Enlarged prostate without lower urinary tract symptoms: Secondary | ICD-10-CM | POA: Diagnosis not present

## 2020-02-23 DIAGNOSIS — S52201D Unspecified fracture of shaft of right ulna, subsequent encounter for closed fracture with routine healing: Secondary | ICD-10-CM | POA: Diagnosis not present

## 2020-02-23 DIAGNOSIS — H43821 Vitreomacular adhesion, right eye: Secondary | ICD-10-CM | POA: Diagnosis not present

## 2020-02-23 DIAGNOSIS — Z6829 Body mass index (BMI) 29.0-29.9, adult: Secondary | ICD-10-CM | POA: Diagnosis not present

## 2020-02-23 DIAGNOSIS — M8949 Other hypertrophic osteoarthropathy, multiple sites: Secondary | ICD-10-CM | POA: Diagnosis not present

## 2020-02-23 DIAGNOSIS — Z96653 Presence of artificial knee joint, bilateral: Secondary | ICD-10-CM | POA: Diagnosis not present

## 2020-02-23 DIAGNOSIS — A419 Sepsis, unspecified organism: Secondary | ICD-10-CM | POA: Diagnosis not present

## 2020-02-23 DIAGNOSIS — M25769 Osteophyte, unspecified knee: Secondary | ICD-10-CM | POA: Diagnosis not present

## 2020-02-23 DIAGNOSIS — I8312 Varicose veins of left lower extremity with inflammation: Secondary | ICD-10-CM | POA: Diagnosis not present

## 2020-02-23 DIAGNOSIS — M5415 Radiculopathy, thoracolumbar region: Secondary | ICD-10-CM | POA: Diagnosis not present

## 2020-02-23 DIAGNOSIS — E041 Nontoxic single thyroid nodule: Secondary | ICD-10-CM | POA: Diagnosis not present

## 2020-02-23 DIAGNOSIS — R791 Abnormal coagulation profile: Secondary | ICD-10-CM | POA: Diagnosis not present

## 2020-02-23 DIAGNOSIS — I5022 Chronic systolic (congestive) heart failure: Secondary | ICD-10-CM | POA: Diagnosis not present

## 2020-02-23 DIAGNOSIS — M7541 Impingement syndrome of right shoulder: Secondary | ICD-10-CM | POA: Diagnosis not present

## 2020-02-23 DIAGNOSIS — M65312 Trigger thumb, left thumb: Secondary | ICD-10-CM | POA: Diagnosis not present

## 2020-02-23 DIAGNOSIS — I712 Thoracic aortic aneurysm, without rupture: Secondary | ICD-10-CM | POA: Diagnosis not present

## 2020-02-23 DIAGNOSIS — F322 Major depressive disorder, single episode, severe without psychotic features: Secondary | ICD-10-CM | POA: Diagnosis not present

## 2020-02-23 DIAGNOSIS — R319 Hematuria, unspecified: Secondary | ICD-10-CM | POA: Diagnosis not present

## 2020-02-23 DIAGNOSIS — Z8739 Personal history of other diseases of the musculoskeletal system and connective tissue: Secondary | ICD-10-CM | POA: Diagnosis not present

## 2020-02-23 DIAGNOSIS — Z950 Presence of cardiac pacemaker: Secondary | ICD-10-CM | POA: Diagnosis not present

## 2020-02-23 DIAGNOSIS — D3132 Benign neoplasm of left choroid: Secondary | ICD-10-CM | POA: Diagnosis not present

## 2020-02-23 DIAGNOSIS — Z961 Presence of intraocular lens: Secondary | ICD-10-CM | POA: Diagnosis not present

## 2020-02-23 DIAGNOSIS — N3001 Acute cystitis with hematuria: Secondary | ICD-10-CM | POA: Diagnosis not present

## 2020-02-23 DIAGNOSIS — S8265XD Nondisplaced fracture of lateral malleolus of left fibula, subsequent encounter for closed fracture with routine healing: Secondary | ICD-10-CM | POA: Diagnosis not present

## 2020-02-23 DIAGNOSIS — I8311 Varicose veins of right lower extremity with inflammation: Secondary | ICD-10-CM | POA: Diagnosis not present

## 2020-02-23 DIAGNOSIS — N3281 Overactive bladder: Secondary | ICD-10-CM | POA: Diagnosis not present

## 2020-02-23 DIAGNOSIS — M1612 Unilateral primary osteoarthritis, left hip: Secondary | ICD-10-CM | POA: Diagnosis not present

## 2020-02-23 DIAGNOSIS — S32010S Wedge compression fracture of first lumbar vertebra, sequela: Secondary | ICD-10-CM | POA: Diagnosis not present

## 2020-02-23 DIAGNOSIS — N289 Disorder of kidney and ureter, unspecified: Secondary | ICD-10-CM | POA: Diagnosis not present

## 2020-02-23 DIAGNOSIS — R82998 Other abnormal findings in urine: Secondary | ICD-10-CM | POA: Diagnosis not present

## 2020-02-23 DIAGNOSIS — N184 Chronic kidney disease, stage 4 (severe): Secondary | ICD-10-CM | POA: Diagnosis not present

## 2020-02-23 DIAGNOSIS — M9901 Segmental and somatic dysfunction of cervical region: Secondary | ICD-10-CM | POA: Diagnosis not present

## 2020-02-23 DIAGNOSIS — M25512 Pain in left shoulder: Secondary | ICD-10-CM | POA: Diagnosis not present

## 2020-02-23 DIAGNOSIS — M79671 Pain in right foot: Secondary | ICD-10-CM | POA: Diagnosis not present

## 2020-02-23 DIAGNOSIS — I6521 Occlusion and stenosis of right carotid artery: Secondary | ICD-10-CM | POA: Diagnosis not present

## 2020-02-23 DIAGNOSIS — K7581 Nonalcoholic steatohepatitis (NASH): Secondary | ICD-10-CM | POA: Diagnosis not present

## 2020-02-23 DIAGNOSIS — C3411 Malignant neoplasm of upper lobe, right bronchus or lung: Secondary | ICD-10-CM | POA: Diagnosis not present

## 2020-02-23 DIAGNOSIS — Z888 Allergy status to other drugs, medicaments and biological substances status: Secondary | ICD-10-CM | POA: Diagnosis not present

## 2020-02-23 DIAGNOSIS — H2512 Age-related nuclear cataract, left eye: Secondary | ICD-10-CM | POA: Diagnosis not present

## 2020-02-23 DIAGNOSIS — H35371 Puckering of macula, right eye: Secondary | ICD-10-CM | POA: Diagnosis not present

## 2020-02-23 DIAGNOSIS — F319 Bipolar disorder, unspecified: Secondary | ICD-10-CM | POA: Diagnosis not present

## 2020-02-23 DIAGNOSIS — Z6835 Body mass index (BMI) 35.0-35.9, adult: Secondary | ICD-10-CM | POA: Diagnosis not present

## 2020-02-23 DIAGNOSIS — M5033 Other cervical disc degeneration, cervicothoracic region: Secondary | ICD-10-CM | POA: Diagnosis not present

## 2020-02-23 DIAGNOSIS — M10079 Idiopathic gout, unspecified ankle and foot: Secondary | ICD-10-CM | POA: Diagnosis not present

## 2020-02-23 DIAGNOSIS — S92344A Nondisplaced fracture of fourth metatarsal bone, right foot, initial encounter for closed fracture: Secondary | ICD-10-CM | POA: Diagnosis not present

## 2020-02-23 DIAGNOSIS — H2513 Age-related nuclear cataract, bilateral: Secondary | ICD-10-CM | POA: Diagnosis not present

## 2020-02-23 DIAGNOSIS — M170 Bilateral primary osteoarthritis of knee: Secondary | ICD-10-CM | POA: Diagnosis not present

## 2020-02-23 DIAGNOSIS — Z452 Encounter for adjustment and management of vascular access device: Secondary | ICD-10-CM | POA: Diagnosis not present

## 2020-02-23 DIAGNOSIS — H353131 Nonexudative age-related macular degeneration, bilateral, early dry stage: Secondary | ICD-10-CM | POA: Diagnosis not present

## 2020-02-23 DIAGNOSIS — I447 Left bundle-branch block, unspecified: Secondary | ICD-10-CM | POA: Diagnosis not present

## 2020-02-23 DIAGNOSIS — M25541 Pain in joints of right hand: Secondary | ICD-10-CM | POA: Diagnosis not present

## 2020-02-23 DIAGNOSIS — M7712 Lateral epicondylitis, left elbow: Secondary | ICD-10-CM | POA: Diagnosis not present

## 2020-02-23 DIAGNOSIS — R6882 Decreased libido: Secondary | ICD-10-CM | POA: Diagnosis not present

## 2020-02-23 DIAGNOSIS — M25559 Pain in unspecified hip: Secondary | ICD-10-CM | POA: Diagnosis not present

## 2020-02-23 DIAGNOSIS — R350 Frequency of micturition: Secondary | ICD-10-CM | POA: Diagnosis not present

## 2020-02-23 DIAGNOSIS — Z01818 Encounter for other preprocedural examination: Secondary | ICD-10-CM | POA: Diagnosis not present

## 2020-02-23 DIAGNOSIS — K3189 Other diseases of stomach and duodenum: Secondary | ICD-10-CM | POA: Diagnosis not present

## 2020-02-23 DIAGNOSIS — K3580 Unspecified acute appendicitis: Secondary | ICD-10-CM | POA: Diagnosis not present

## 2020-02-23 DIAGNOSIS — H52203 Unspecified astigmatism, bilateral: Secondary | ICD-10-CM | POA: Diagnosis not present

## 2020-02-23 DIAGNOSIS — S7291XD Unspecified fracture of right femur, subsequent encounter for closed fracture with routine healing: Secondary | ICD-10-CM | POA: Diagnosis not present

## 2020-02-23 DIAGNOSIS — G250 Essential tremor: Secondary | ICD-10-CM | POA: Diagnosis not present

## 2020-02-23 DIAGNOSIS — N3941 Urge incontinence: Secondary | ICD-10-CM | POA: Diagnosis not present

## 2020-02-23 DIAGNOSIS — N179 Acute kidney failure, unspecified: Secondary | ICD-10-CM | POA: Diagnosis not present

## 2020-02-23 DIAGNOSIS — Z1329 Encounter for screening for other suspected endocrine disorder: Secondary | ICD-10-CM | POA: Diagnosis not present

## 2020-02-23 DIAGNOSIS — M19079 Primary osteoarthritis, unspecified ankle and foot: Secondary | ICD-10-CM | POA: Diagnosis not present

## 2020-02-23 DIAGNOSIS — R1111 Vomiting without nausea: Secondary | ICD-10-CM | POA: Diagnosis not present

## 2020-02-23 DIAGNOSIS — M48062 Spinal stenosis, lumbar region with neurogenic claudication: Secondary | ICD-10-CM | POA: Diagnosis not present

## 2020-02-23 DIAGNOSIS — H00034 Abscess of left upper eyelid: Secondary | ICD-10-CM | POA: Diagnosis not present

## 2020-02-23 DIAGNOSIS — I479 Paroxysmal tachycardia, unspecified: Secondary | ICD-10-CM | POA: Diagnosis not present

## 2020-02-23 DIAGNOSIS — H31093 Other chorioretinal scars, bilateral: Secondary | ICD-10-CM | POA: Diagnosis not present

## 2020-02-23 DIAGNOSIS — T25221A Burn of second degree of right foot, initial encounter: Secondary | ICD-10-CM | POA: Diagnosis not present

## 2020-02-23 DIAGNOSIS — I6523 Occlusion and stenosis of bilateral carotid arteries: Secondary | ICD-10-CM | POA: Diagnosis not present

## 2020-02-23 DIAGNOSIS — C44729 Squamous cell carcinoma of skin of left lower limb, including hip: Secondary | ICD-10-CM | POA: Diagnosis not present

## 2020-02-23 DIAGNOSIS — E1351 Other specified diabetes mellitus with diabetic peripheral angiopathy without gangrene: Secondary | ICD-10-CM | POA: Diagnosis not present

## 2020-02-23 DIAGNOSIS — Z124 Encounter for screening for malignant neoplasm of cervix: Secondary | ICD-10-CM | POA: Diagnosis not present

## 2020-02-23 DIAGNOSIS — S99922A Unspecified injury of left foot, initial encounter: Secondary | ICD-10-CM | POA: Diagnosis not present

## 2020-02-23 DIAGNOSIS — Z03818 Encounter for observation for suspected exposure to other biological agents ruled out: Secondary | ICD-10-CM | POA: Diagnosis not present

## 2020-02-23 DIAGNOSIS — L300 Nummular dermatitis: Secondary | ICD-10-CM | POA: Diagnosis not present

## 2020-02-23 DIAGNOSIS — S2020XA Contusion of thorax, unspecified, initial encounter: Secondary | ICD-10-CM | POA: Diagnosis not present

## 2020-02-23 DIAGNOSIS — D2261 Melanocytic nevi of right upper limb, including shoulder: Secondary | ICD-10-CM | POA: Diagnosis not present

## 2020-02-23 DIAGNOSIS — H25043 Posterior subcapsular polar age-related cataract, bilateral: Secondary | ICD-10-CM | POA: Diagnosis not present

## 2020-02-23 DIAGNOSIS — N2889 Other specified disorders of kidney and ureter: Secondary | ICD-10-CM | POA: Diagnosis not present

## 2020-02-23 DIAGNOSIS — Z9641 Presence of insulin pump (external) (internal): Secondary | ICD-10-CM | POA: Diagnosis not present

## 2020-02-23 DIAGNOSIS — T17900A Unspecified foreign body in respiratory tract, part unspecified causing asphyxiation, initial encounter: Secondary | ICD-10-CM | POA: Diagnosis not present

## 2020-02-23 DIAGNOSIS — N72 Inflammatory disease of cervix uteri: Secondary | ICD-10-CM | POA: Diagnosis not present

## 2020-02-23 DIAGNOSIS — F332 Major depressive disorder, recurrent severe without psychotic features: Secondary | ICD-10-CM | POA: Diagnosis not present

## 2020-02-23 DIAGNOSIS — R609 Edema, unspecified: Secondary | ICD-10-CM | POA: Diagnosis not present

## 2020-02-23 DIAGNOSIS — N401 Enlarged prostate with lower urinary tract symptoms: Secondary | ICD-10-CM | POA: Diagnosis not present

## 2020-02-23 DIAGNOSIS — N1832 Chronic kidney disease, stage 3b: Secondary | ICD-10-CM | POA: Diagnosis not present

## 2020-02-23 DIAGNOSIS — Z0001 Encounter for general adult medical examination with abnormal findings: Secondary | ICD-10-CM | POA: Diagnosis not present

## 2020-02-23 DIAGNOSIS — H353122 Nonexudative age-related macular degeneration, left eye, intermediate dry stage: Secondary | ICD-10-CM | POA: Diagnosis not present

## 2020-02-23 DIAGNOSIS — H5213 Myopia, bilateral: Secondary | ICD-10-CM | POA: Diagnosis not present

## 2020-02-23 DIAGNOSIS — F325 Major depressive disorder, single episode, in full remission: Secondary | ICD-10-CM | POA: Diagnosis not present

## 2020-02-23 DIAGNOSIS — H25041 Posterior subcapsular polar age-related cataract, right eye: Secondary | ICD-10-CM | POA: Diagnosis not present

## 2020-02-23 DIAGNOSIS — S52501A Unspecified fracture of the lower end of right radius, initial encounter for closed fracture: Secondary | ICD-10-CM | POA: Diagnosis not present

## 2020-02-23 DIAGNOSIS — Z794 Long term (current) use of insulin: Secondary | ICD-10-CM | POA: Diagnosis not present

## 2020-02-23 DIAGNOSIS — D689 Coagulation defect, unspecified: Secondary | ICD-10-CM | POA: Diagnosis not present

## 2020-02-23 DIAGNOSIS — F3289 Other specified depressive episodes: Secondary | ICD-10-CM | POA: Diagnosis not present

## 2020-02-23 DIAGNOSIS — C772 Secondary and unspecified malignant neoplasm of intra-abdominal lymph nodes: Secondary | ICD-10-CM | POA: Diagnosis not present

## 2020-02-23 DIAGNOSIS — C184 Malignant neoplasm of transverse colon: Secondary | ICD-10-CM | POA: Diagnosis not present

## 2020-02-23 DIAGNOSIS — M818 Other osteoporosis without current pathological fracture: Secondary | ICD-10-CM | POA: Diagnosis not present

## 2020-02-23 DIAGNOSIS — I21A1 Myocardial infarction type 2: Secondary | ICD-10-CM | POA: Diagnosis not present

## 2020-02-23 DIAGNOSIS — H53432 Sector or arcuate defects, left eye: Secondary | ICD-10-CM | POA: Diagnosis not present

## 2020-02-23 DIAGNOSIS — C3490 Malignant neoplasm of unspecified part of unspecified bronchus or lung: Secondary | ICD-10-CM | POA: Diagnosis not present

## 2020-02-23 DIAGNOSIS — E0789 Other specified disorders of thyroid: Secondary | ICD-10-CM | POA: Diagnosis not present

## 2020-02-23 DIAGNOSIS — E1149 Type 2 diabetes mellitus with other diabetic neurological complication: Secondary | ICD-10-CM | POA: Diagnosis not present

## 2020-02-23 DIAGNOSIS — I5042 Chronic combined systolic (congestive) and diastolic (congestive) heart failure: Secondary | ICD-10-CM | POA: Diagnosis not present

## 2020-02-23 DIAGNOSIS — R6881 Early satiety: Secondary | ICD-10-CM | POA: Diagnosis not present

## 2020-02-23 DIAGNOSIS — R627 Adult failure to thrive: Secondary | ICD-10-CM | POA: Diagnosis not present

## 2020-02-23 DIAGNOSIS — M75101 Unspecified rotator cuff tear or rupture of right shoulder, not specified as traumatic: Secondary | ICD-10-CM | POA: Diagnosis not present

## 2020-02-23 DIAGNOSIS — M5416 Radiculopathy, lumbar region: Secondary | ICD-10-CM | POA: Diagnosis not present

## 2020-02-23 DIAGNOSIS — M25661 Stiffness of right knee, not elsewhere classified: Secondary | ICD-10-CM | POA: Diagnosis not present

## 2020-02-23 DIAGNOSIS — L03115 Cellulitis of right lower limb: Secondary | ICD-10-CM | POA: Diagnosis not present

## 2020-02-23 DIAGNOSIS — S63391A Traumatic rupture of other ligament of right wrist, initial encounter: Secondary | ICD-10-CM | POA: Diagnosis not present

## 2020-02-23 DIAGNOSIS — E560 Deficiency of vitamin E: Secondary | ICD-10-CM | POA: Diagnosis not present

## 2020-02-23 DIAGNOSIS — R911 Solitary pulmonary nodule: Secondary | ICD-10-CM | POA: Diagnosis not present

## 2020-02-23 DIAGNOSIS — Z9483 Pancreas transplant status: Secondary | ICD-10-CM | POA: Diagnosis not present

## 2020-02-23 DIAGNOSIS — R1319 Other dysphagia: Secondary | ICD-10-CM | POA: Diagnosis not present

## 2020-02-23 DIAGNOSIS — Z6841 Body Mass Index (BMI) 40.0 and over, adult: Secondary | ICD-10-CM | POA: Diagnosis not present

## 2020-02-23 DIAGNOSIS — R0683 Snoring: Secondary | ICD-10-CM | POA: Diagnosis not present

## 2020-02-23 DIAGNOSIS — S90812A Abrasion, left foot, initial encounter: Secondary | ICD-10-CM | POA: Diagnosis not present

## 2020-02-23 DIAGNOSIS — M2010 Hallux valgus (acquired), unspecified foot: Secondary | ICD-10-CM | POA: Diagnosis not present

## 2020-02-23 DIAGNOSIS — L89154 Pressure ulcer of sacral region, stage 4: Secondary | ICD-10-CM | POA: Diagnosis not present

## 2020-02-23 DIAGNOSIS — Z01810 Encounter for preprocedural cardiovascular examination: Secondary | ICD-10-CM | POA: Diagnosis not present

## 2020-02-23 DIAGNOSIS — Z0000 Encounter for general adult medical examination without abnormal findings: Secondary | ICD-10-CM | POA: Diagnosis not present

## 2020-02-23 DIAGNOSIS — H3582 Retinal ischemia: Secondary | ICD-10-CM | POA: Diagnosis not present

## 2020-02-23 DIAGNOSIS — R3912 Poor urinary stream: Secondary | ICD-10-CM | POA: Diagnosis not present

## 2020-02-23 DIAGNOSIS — N183 Chronic kidney disease, stage 3 unspecified: Secondary | ICD-10-CM | POA: Diagnosis not present

## 2020-02-23 DIAGNOSIS — M5126 Other intervertebral disc displacement, lumbar region: Secondary | ICD-10-CM | POA: Diagnosis not present

## 2020-02-23 DIAGNOSIS — H8109 Meniere's disease, unspecified ear: Secondary | ICD-10-CM | POA: Diagnosis not present

## 2020-02-23 DIAGNOSIS — E038 Other specified hypothyroidism: Secondary | ICD-10-CM | POA: Diagnosis not present

## 2020-02-23 DIAGNOSIS — R42 Dizziness and giddiness: Secondary | ICD-10-CM | POA: Diagnosis not present

## 2020-02-23 DIAGNOSIS — I441 Atrioventricular block, second degree: Secondary | ICD-10-CM | POA: Diagnosis not present

## 2020-02-23 DIAGNOSIS — Z85828 Personal history of other malignant neoplasm of skin: Secondary | ICD-10-CM | POA: Diagnosis not present

## 2020-02-23 DIAGNOSIS — I455 Other specified heart block: Secondary | ICD-10-CM | POA: Diagnosis not present

## 2020-02-23 DIAGNOSIS — M479 Spondylosis, unspecified: Secondary | ICD-10-CM | POA: Diagnosis not present

## 2020-02-23 DIAGNOSIS — I6782 Cerebral ischemia: Secondary | ICD-10-CM | POA: Diagnosis not present

## 2020-02-23 DIAGNOSIS — M25551 Pain in right hip: Secondary | ICD-10-CM | POA: Diagnosis not present

## 2020-02-23 DIAGNOSIS — M9905 Segmental and somatic dysfunction of pelvic region: Secondary | ICD-10-CM | POA: Diagnosis not present

## 2020-02-23 DIAGNOSIS — L920 Granuloma annulare: Secondary | ICD-10-CM | POA: Diagnosis not present

## 2020-02-23 DIAGNOSIS — H3093 Unspecified chorioretinal inflammation, bilateral: Secondary | ICD-10-CM | POA: Diagnosis not present

## 2020-02-23 DIAGNOSIS — H35423 Microcystoid degeneration of retina, bilateral: Secondary | ICD-10-CM | POA: Diagnosis not present

## 2020-02-23 DIAGNOSIS — H16221 Keratoconjunctivitis sicca, not specified as Sjogren's, right eye: Secondary | ICD-10-CM | POA: Diagnosis not present

## 2020-02-23 DIAGNOSIS — R1084 Generalized abdominal pain: Secondary | ICD-10-CM | POA: Diagnosis not present

## 2020-02-23 DIAGNOSIS — E113513 Type 2 diabetes mellitus with proliferative diabetic retinopathy with macular edema, bilateral: Secondary | ICD-10-CM | POA: Diagnosis not present

## 2020-02-23 DIAGNOSIS — M533 Sacrococcygeal disorders, not elsewhere classified: Secondary | ICD-10-CM | POA: Diagnosis not present

## 2020-02-23 DIAGNOSIS — F17228 Nicotine dependence, chewing tobacco, with other nicotine-induced disorders: Secondary | ICD-10-CM | POA: Diagnosis not present

## 2020-02-23 DIAGNOSIS — R05 Cough: Secondary | ICD-10-CM | POA: Diagnosis not present

## 2020-02-23 DIAGNOSIS — C37 Malignant neoplasm of thymus: Secondary | ICD-10-CM | POA: Diagnosis not present

## 2020-02-23 DIAGNOSIS — M542 Cervicalgia: Secondary | ICD-10-CM | POA: Diagnosis not present

## 2020-02-23 DIAGNOSIS — R202 Paresthesia of skin: Secondary | ICD-10-CM | POA: Diagnosis not present

## 2020-02-23 DIAGNOSIS — Z1159 Encounter for screening for other viral diseases: Secondary | ICD-10-CM | POA: Diagnosis not present

## 2020-02-23 DIAGNOSIS — L659 Nonscarring hair loss, unspecified: Secondary | ICD-10-CM | POA: Diagnosis not present

## 2020-02-23 DIAGNOSIS — R0981 Nasal congestion: Secondary | ICD-10-CM | POA: Diagnosis not present

## 2020-02-23 DIAGNOSIS — J1282 Pneumonia due to coronavirus disease 2019: Secondary | ICD-10-CM | POA: Diagnosis not present

## 2020-02-23 DIAGNOSIS — G4733 Obstructive sleep apnea (adult) (pediatric): Secondary | ICD-10-CM | POA: Diagnosis not present

## 2020-02-23 DIAGNOSIS — K8020 Calculus of gallbladder without cholecystitis without obstruction: Secondary | ICD-10-CM | POA: Diagnosis not present

## 2020-02-23 DIAGNOSIS — F410 Panic disorder [episodic paroxysmal anxiety] without agoraphobia: Secondary | ICD-10-CM | POA: Diagnosis not present

## 2020-02-23 DIAGNOSIS — M75112 Incomplete rotator cuff tear or rupture of left shoulder, not specified as traumatic: Secondary | ICD-10-CM | POA: Diagnosis not present

## 2020-02-23 DIAGNOSIS — Z809 Family history of malignant neoplasm, unspecified: Secondary | ICD-10-CM | POA: Diagnosis not present

## 2020-02-23 DIAGNOSIS — F411 Generalized anxiety disorder: Secondary | ICD-10-CM | POA: Diagnosis not present

## 2020-02-23 DIAGNOSIS — S41111A Laceration without foreign body of right upper arm, initial encounter: Secondary | ICD-10-CM | POA: Diagnosis not present

## 2020-02-23 DIAGNOSIS — H409 Unspecified glaucoma: Secondary | ICD-10-CM | POA: Diagnosis not present

## 2020-02-23 DIAGNOSIS — E1143 Type 2 diabetes mellitus with diabetic autonomic (poly)neuropathy: Secondary | ICD-10-CM | POA: Diagnosis not present

## 2020-02-23 DIAGNOSIS — Z8041 Family history of malignant neoplasm of ovary: Secondary | ICD-10-CM | POA: Diagnosis not present

## 2020-02-23 DIAGNOSIS — A809 Acute poliomyelitis, unspecified: Secondary | ICD-10-CM | POA: Diagnosis not present

## 2020-02-23 DIAGNOSIS — Z9861 Coronary angioplasty status: Secondary | ICD-10-CM | POA: Diagnosis not present

## 2020-02-23 DIAGNOSIS — S9032XA Contusion of left foot, initial encounter: Secondary | ICD-10-CM | POA: Diagnosis not present

## 2020-02-23 DIAGNOSIS — I5030 Unspecified diastolic (congestive) heart failure: Secondary | ICD-10-CM | POA: Diagnosis not present

## 2020-02-23 DIAGNOSIS — G8250 Quadriplegia, unspecified: Secondary | ICD-10-CM | POA: Diagnosis not present

## 2020-02-23 DIAGNOSIS — K2960 Other gastritis without bleeding: Secondary | ICD-10-CM | POA: Diagnosis not present

## 2020-02-23 DIAGNOSIS — F329 Major depressive disorder, single episode, unspecified: Secondary | ICD-10-CM | POA: Diagnosis not present

## 2020-02-23 DIAGNOSIS — F4310 Post-traumatic stress disorder, unspecified: Secondary | ICD-10-CM | POA: Diagnosis not present

## 2020-02-23 DIAGNOSIS — H53022 Refractive amblyopia, left eye: Secondary | ICD-10-CM | POA: Diagnosis not present

## 2020-02-23 DIAGNOSIS — H40153 Residual stage of open-angle glaucoma, bilateral: Secondary | ICD-10-CM | POA: Diagnosis not present

## 2020-02-23 DIAGNOSIS — I499 Cardiac arrhythmia, unspecified: Secondary | ICD-10-CM | POA: Diagnosis not present

## 2020-02-23 DIAGNOSIS — F339 Major depressive disorder, recurrent, unspecified: Secondary | ICD-10-CM | POA: Diagnosis not present

## 2020-02-23 DIAGNOSIS — R54 Age-related physical debility: Secondary | ICD-10-CM | POA: Diagnosis not present

## 2020-02-23 DIAGNOSIS — M25569 Pain in unspecified knee: Secondary | ICD-10-CM | POA: Diagnosis not present

## 2020-02-23 DIAGNOSIS — Z713 Dietary counseling and surveillance: Secondary | ICD-10-CM | POA: Diagnosis not present

## 2020-02-23 DIAGNOSIS — K625 Hemorrhage of anus and rectum: Secondary | ICD-10-CM | POA: Diagnosis not present

## 2020-02-23 DIAGNOSIS — E113412 Type 2 diabetes mellitus with severe nonproliferative diabetic retinopathy with macular edema, left eye: Secondary | ICD-10-CM | POA: Diagnosis not present

## 2020-02-23 DIAGNOSIS — M5136 Other intervertebral disc degeneration, lumbar region: Secondary | ICD-10-CM | POA: Diagnosis not present

## 2020-02-23 DIAGNOSIS — H401113 Primary open-angle glaucoma, right eye, severe stage: Secondary | ICD-10-CM | POA: Diagnosis not present

## 2020-02-23 DIAGNOSIS — Z1283 Encounter for screening for malignant neoplasm of skin: Secondary | ICD-10-CM | POA: Diagnosis not present

## 2020-02-23 DIAGNOSIS — M1812 Unilateral primary osteoarthritis of first carpometacarpal joint, left hand: Secondary | ICD-10-CM | POA: Diagnosis not present

## 2020-02-23 DIAGNOSIS — R0689 Other abnormalities of breathing: Secondary | ICD-10-CM | POA: Diagnosis not present

## 2020-02-23 DIAGNOSIS — G20 Parkinson's disease: Secondary | ICD-10-CM | POA: Diagnosis not present

## 2020-02-23 DIAGNOSIS — E1365 Other specified diabetes mellitus with hyperglycemia: Secondary | ICD-10-CM | POA: Diagnosis not present

## 2020-02-23 DIAGNOSIS — H35443 Age-related reticular degeneration of retina, bilateral: Secondary | ICD-10-CM | POA: Diagnosis not present

## 2020-02-23 DIAGNOSIS — I69398 Other sequelae of cerebral infarction: Secondary | ICD-10-CM | POA: Diagnosis not present

## 2020-02-23 DIAGNOSIS — R600 Localized edema: Secondary | ICD-10-CM | POA: Diagnosis not present

## 2020-02-23 DIAGNOSIS — L111 Transient acantholytic dermatosis [Grover]: Secondary | ICD-10-CM | POA: Diagnosis not present

## 2020-02-23 DIAGNOSIS — F1110 Opioid abuse, uncomplicated: Secondary | ICD-10-CM | POA: Diagnosis not present

## 2020-02-23 DIAGNOSIS — R61 Generalized hyperhidrosis: Secondary | ICD-10-CM | POA: Diagnosis not present

## 2020-02-23 DIAGNOSIS — M5124 Other intervertebral disc displacement, thoracic region: Secondary | ICD-10-CM | POA: Diagnosis not present

## 2020-02-23 DIAGNOSIS — C7B03 Secondary carcinoid tumors of bone: Secondary | ICD-10-CM | POA: Diagnosis not present

## 2020-02-23 DIAGNOSIS — Z89512 Acquired absence of left leg below knee: Secondary | ICD-10-CM | POA: Diagnosis not present

## 2020-02-23 DIAGNOSIS — R110 Nausea: Secondary | ICD-10-CM | POA: Diagnosis not present

## 2020-02-23 DIAGNOSIS — M5030 Other cervical disc degeneration, unspecified cervical region: Secondary | ICD-10-CM | POA: Diagnosis not present

## 2020-02-23 DIAGNOSIS — I951 Orthostatic hypotension: Secondary | ICD-10-CM | POA: Diagnosis not present

## 2020-02-23 DIAGNOSIS — Z79811 Long term (current) use of aromatase inhibitors: Secondary | ICD-10-CM | POA: Diagnosis not present

## 2020-02-23 DIAGNOSIS — Z952 Presence of prosthetic heart valve: Secondary | ICD-10-CM | POA: Diagnosis not present

## 2020-02-23 DIAGNOSIS — J60 Coalworker's pneumoconiosis: Secondary | ICD-10-CM | POA: Diagnosis not present

## 2020-02-23 DIAGNOSIS — G43109 Migraine with aura, not intractable, without status migrainosus: Secondary | ICD-10-CM | POA: Diagnosis not present

## 2020-02-23 DIAGNOSIS — S0502XA Injury of conjunctiva and corneal abrasion without foreign body, left eye, initial encounter: Secondary | ICD-10-CM | POA: Diagnosis not present

## 2020-02-23 DIAGNOSIS — E872 Acidosis: Secondary | ICD-10-CM | POA: Diagnosis not present

## 2020-02-23 DIAGNOSIS — M797 Fibromyalgia: Secondary | ICD-10-CM | POA: Diagnosis not present

## 2020-02-23 DIAGNOSIS — E11621 Type 2 diabetes mellitus with foot ulcer: Secondary | ICD-10-CM | POA: Diagnosis not present

## 2020-02-23 DIAGNOSIS — K3184 Gastroparesis: Secondary | ICD-10-CM | POA: Diagnosis not present

## 2020-02-23 DIAGNOSIS — N2581 Secondary hyperparathyroidism of renal origin: Secondary | ICD-10-CM | POA: Diagnosis not present

## 2020-02-23 DIAGNOSIS — Z6831 Body mass index (BMI) 31.0-31.9, adult: Secondary | ICD-10-CM | POA: Diagnosis not present

## 2020-02-23 DIAGNOSIS — I2782 Chronic pulmonary embolism: Secondary | ICD-10-CM | POA: Diagnosis not present

## 2020-02-23 DIAGNOSIS — M0609 Rheumatoid arthritis without rheumatoid factor, multiple sites: Secondary | ICD-10-CM | POA: Diagnosis not present

## 2020-02-23 DIAGNOSIS — D2262 Melanocytic nevi of left upper limb, including shoulder: Secondary | ICD-10-CM | POA: Diagnosis not present

## 2020-02-23 DIAGNOSIS — M79674 Pain in right toe(s): Secondary | ICD-10-CM | POA: Diagnosis not present

## 2020-02-23 DIAGNOSIS — Z993 Dependence on wheelchair: Secondary | ICD-10-CM | POA: Diagnosis not present

## 2020-02-23 DIAGNOSIS — I442 Atrioventricular block, complete: Secondary | ICD-10-CM | POA: Diagnosis not present

## 2020-02-23 DIAGNOSIS — K5730 Diverticulosis of large intestine without perforation or abscess without bleeding: Secondary | ICD-10-CM | POA: Diagnosis not present

## 2020-02-23 DIAGNOSIS — L719 Rosacea, unspecified: Secondary | ICD-10-CM | POA: Diagnosis not present

## 2020-02-23 DIAGNOSIS — S72041D Displaced fracture of base of neck of right femur, subsequent encounter for closed fracture with routine healing: Secondary | ICD-10-CM | POA: Diagnosis not present

## 2020-02-23 DIAGNOSIS — F603 Borderline personality disorder: Secondary | ICD-10-CM | POA: Diagnosis not present

## 2020-02-23 DIAGNOSIS — I87009 Postthrombotic syndrome without complications of unspecified extremity: Secondary | ICD-10-CM | POA: Diagnosis not present

## 2020-02-23 DIAGNOSIS — N529 Male erectile dysfunction, unspecified: Secondary | ICD-10-CM | POA: Diagnosis not present

## 2020-02-23 DIAGNOSIS — M129 Arthropathy, unspecified: Secondary | ICD-10-CM | POA: Diagnosis not present

## 2020-02-23 DIAGNOSIS — T50905A Adverse effect of unspecified drugs, medicaments and biological substances, initial encounter: Secondary | ICD-10-CM | POA: Diagnosis not present

## 2020-02-23 DIAGNOSIS — R441 Visual hallucinations: Secondary | ICD-10-CM | POA: Diagnosis not present

## 2020-02-23 DIAGNOSIS — F17210 Nicotine dependence, cigarettes, uncomplicated: Secondary | ICD-10-CM | POA: Diagnosis not present

## 2020-02-23 DIAGNOSIS — Z886 Allergy status to analgesic agent status: Secondary | ICD-10-CM | POA: Diagnosis not present

## 2020-02-23 DIAGNOSIS — Z86010 Personal history of colonic polyps: Secondary | ICD-10-CM | POA: Diagnosis not present

## 2020-02-23 DIAGNOSIS — Z9189 Other specified personal risk factors, not elsewhere classified: Secondary | ICD-10-CM | POA: Diagnosis not present

## 2020-02-23 DIAGNOSIS — N5201 Erectile dysfunction due to arterial insufficiency: Secondary | ICD-10-CM | POA: Diagnosis not present

## 2020-02-23 DIAGNOSIS — E568 Deficiency of other vitamins: Secondary | ICD-10-CM | POA: Diagnosis not present

## 2020-02-23 DIAGNOSIS — I482 Chronic atrial fibrillation, unspecified: Secondary | ICD-10-CM | POA: Diagnosis not present

## 2020-02-23 DIAGNOSIS — H35713 Central serous chorioretinopathy, bilateral: Secondary | ICD-10-CM | POA: Diagnosis not present

## 2020-02-23 DIAGNOSIS — R4789 Other speech disturbances: Secondary | ICD-10-CM | POA: Diagnosis not present

## 2020-02-23 DIAGNOSIS — I1310 Hypertensive heart and chronic kidney disease without heart failure, with stage 1 through stage 4 chronic kidney disease, or unspecified chronic kidney disease: Secondary | ICD-10-CM | POA: Diagnosis not present

## 2020-02-23 DIAGNOSIS — K839 Disease of biliary tract, unspecified: Secondary | ICD-10-CM | POA: Diagnosis not present

## 2020-02-23 DIAGNOSIS — M4722 Other spondylosis with radiculopathy, cervical region: Secondary | ICD-10-CM | POA: Diagnosis not present

## 2020-02-23 DIAGNOSIS — J9621 Acute and chronic respiratory failure with hypoxia: Secondary | ICD-10-CM | POA: Diagnosis not present

## 2020-02-23 DIAGNOSIS — J342 Deviated nasal septum: Secondary | ICD-10-CM | POA: Diagnosis not present

## 2020-02-23 DIAGNOSIS — I428 Other cardiomyopathies: Secondary | ICD-10-CM | POA: Diagnosis not present

## 2020-02-23 DIAGNOSIS — H40219 Acute angle-closure glaucoma, unspecified eye: Secondary | ICD-10-CM | POA: Diagnosis not present

## 2020-02-23 DIAGNOSIS — R4189 Other symptoms and signs involving cognitive functions and awareness: Secondary | ICD-10-CM | POA: Diagnosis not present

## 2020-02-23 DIAGNOSIS — D45 Polycythemia vera: Secondary | ICD-10-CM | POA: Diagnosis not present

## 2020-02-23 DIAGNOSIS — E0822 Diabetes mellitus due to underlying condition with diabetic chronic kidney disease: Secondary | ICD-10-CM | POA: Diagnosis not present

## 2020-02-23 DIAGNOSIS — B181 Chronic viral hepatitis B without delta-agent: Secondary | ICD-10-CM | POA: Diagnosis not present

## 2020-02-23 DIAGNOSIS — A400 Sepsis due to streptococcus, group A: Secondary | ICD-10-CM | POA: Diagnosis not present

## 2020-02-23 DIAGNOSIS — S46011A Strain of muscle(s) and tendon(s) of the rotator cuff of right shoulder, initial encounter: Secondary | ICD-10-CM | POA: Diagnosis not present

## 2020-02-23 DIAGNOSIS — E440 Moderate protein-calorie malnutrition: Secondary | ICD-10-CM | POA: Diagnosis not present

## 2020-02-23 DIAGNOSIS — I5189 Other ill-defined heart diseases: Secondary | ICD-10-CM | POA: Diagnosis not present

## 2020-02-23 DIAGNOSIS — I781 Nevus, non-neoplastic: Secondary | ICD-10-CM | POA: Diagnosis not present

## 2020-02-23 DIAGNOSIS — M19012 Primary osteoarthritis, left shoulder: Secondary | ICD-10-CM | POA: Diagnosis not present

## 2020-02-23 DIAGNOSIS — Z89612 Acquired absence of left leg above knee: Secondary | ICD-10-CM | POA: Diagnosis not present

## 2020-02-23 DIAGNOSIS — Z720 Tobacco use: Secondary | ICD-10-CM | POA: Diagnosis not present

## 2020-02-23 DIAGNOSIS — Z1212 Encounter for screening for malignant neoplasm of rectum: Secondary | ICD-10-CM | POA: Diagnosis not present

## 2020-02-23 DIAGNOSIS — C50212 Malignant neoplasm of upper-inner quadrant of left female breast: Secondary | ICD-10-CM | POA: Diagnosis not present

## 2020-02-23 DIAGNOSIS — K429 Umbilical hernia without obstruction or gangrene: Secondary | ICD-10-CM | POA: Diagnosis not present

## 2020-02-23 DIAGNOSIS — B9689 Other specified bacterial agents as the cause of diseases classified elsewhere: Secondary | ICD-10-CM | POA: Diagnosis not present

## 2020-02-23 DIAGNOSIS — R531 Weakness: Secondary | ICD-10-CM | POA: Diagnosis not present

## 2020-02-23 DIAGNOSIS — G44229 Chronic tension-type headache, not intractable: Secondary | ICD-10-CM | POA: Diagnosis not present

## 2020-02-23 DIAGNOSIS — C4441 Basal cell carcinoma of skin of scalp and neck: Secondary | ICD-10-CM | POA: Diagnosis not present

## 2020-02-23 DIAGNOSIS — Z8619 Personal history of other infectious and parasitic diseases: Secondary | ICD-10-CM | POA: Diagnosis not present

## 2020-02-23 DIAGNOSIS — Z681 Body mass index (BMI) 19 or less, adult: Secondary | ICD-10-CM | POA: Diagnosis not present

## 2020-02-23 DIAGNOSIS — Z5181 Encounter for therapeutic drug level monitoring: Secondary | ICD-10-CM | POA: Diagnosis not present

## 2020-02-23 DIAGNOSIS — I788 Other diseases of capillaries: Secondary | ICD-10-CM | POA: Diagnosis not present

## 2020-02-23 DIAGNOSIS — I6340 Cerebral infarction due to embolism of unspecified cerebral artery: Secondary | ICD-10-CM | POA: Diagnosis not present

## 2020-02-23 DIAGNOSIS — J302 Other seasonal allergic rhinitis: Secondary | ICD-10-CM | POA: Diagnosis not present

## 2020-02-23 DIAGNOSIS — E663 Overweight: Secondary | ICD-10-CM | POA: Diagnosis not present

## 2020-02-23 DIAGNOSIS — K5792 Diverticulitis of intestine, part unspecified, without perforation or abscess without bleeding: Secondary | ICD-10-CM | POA: Diagnosis not present

## 2020-02-23 DIAGNOSIS — D1779 Benign lipomatous neoplasm of other sites: Secondary | ICD-10-CM | POA: Diagnosis not present

## 2020-02-23 DIAGNOSIS — M4310 Spondylolisthesis, site unspecified: Secondary | ICD-10-CM | POA: Diagnosis not present

## 2020-02-23 DIAGNOSIS — F3341 Major depressive disorder, recurrent, in partial remission: Secondary | ICD-10-CM | POA: Diagnosis not present

## 2020-02-23 DIAGNOSIS — B348 Other viral infections of unspecified site: Secondary | ICD-10-CM | POA: Diagnosis not present

## 2020-02-23 DIAGNOSIS — C649 Malignant neoplasm of unspecified kidney, except renal pelvis: Secondary | ICD-10-CM | POA: Diagnosis not present

## 2020-02-23 DIAGNOSIS — G43909 Migraine, unspecified, not intractable, without status migrainosus: Secondary | ICD-10-CM | POA: Diagnosis not present

## 2020-02-23 DIAGNOSIS — G309 Alzheimer's disease, unspecified: Secondary | ICD-10-CM | POA: Diagnosis not present

## 2020-02-23 DIAGNOSIS — M5412 Radiculopathy, cervical region: Secondary | ICD-10-CM | POA: Diagnosis not present

## 2020-02-23 DIAGNOSIS — Z516 Encounter for desensitization to allergens: Secondary | ICD-10-CM | POA: Diagnosis not present

## 2020-02-23 DIAGNOSIS — Z86006 Personal history of melanoma in-situ: Secondary | ICD-10-CM | POA: Diagnosis not present

## 2020-02-23 DIAGNOSIS — R7401 Elevation of levels of liver transaminase levels: Secondary | ICD-10-CM | POA: Diagnosis not present

## 2020-02-23 DIAGNOSIS — R4182 Altered mental status, unspecified: Secondary | ICD-10-CM | POA: Diagnosis not present

## 2020-02-23 DIAGNOSIS — L817 Pigmented purpuric dermatosis: Secondary | ICD-10-CM | POA: Diagnosis not present

## 2020-02-23 DIAGNOSIS — I824Y9 Acute embolism and thrombosis of unspecified deep veins of unspecified proximal lower extremity: Secondary | ICD-10-CM | POA: Diagnosis not present

## 2020-02-23 DIAGNOSIS — H04553 Acquired stenosis of bilateral nasolacrimal duct: Secondary | ICD-10-CM | POA: Diagnosis not present

## 2020-02-23 DIAGNOSIS — D473 Essential (hemorrhagic) thrombocythemia: Secondary | ICD-10-CM | POA: Diagnosis not present

## 2020-02-23 DIAGNOSIS — M79644 Pain in right finger(s): Secondary | ICD-10-CM | POA: Diagnosis not present

## 2020-02-23 DIAGNOSIS — Z7901 Long term (current) use of anticoagulants: Secondary | ICD-10-CM | POA: Diagnosis not present

## 2020-02-23 DIAGNOSIS — M25552 Pain in left hip: Secondary | ICD-10-CM | POA: Diagnosis not present

## 2020-02-23 DIAGNOSIS — H401132 Primary open-angle glaucoma, bilateral, moderate stage: Secondary | ICD-10-CM | POA: Diagnosis not present

## 2020-02-23 DIAGNOSIS — R0989 Other specified symptoms and signs involving the circulatory and respiratory systems: Secondary | ICD-10-CM | POA: Diagnosis not present

## 2020-02-23 DIAGNOSIS — Z171 Estrogen receptor negative status [ER-]: Secondary | ICD-10-CM | POA: Diagnosis not present

## 2020-02-23 DIAGNOSIS — I7789 Other specified disorders of arteries and arterioles: Secondary | ICD-10-CM | POA: Diagnosis not present

## 2020-02-23 DIAGNOSIS — D0461 Carcinoma in situ of skin of right upper limb, including shoulder: Secondary | ICD-10-CM | POA: Diagnosis not present

## 2020-02-23 DIAGNOSIS — D7219 Other eosinophilia: Secondary | ICD-10-CM | POA: Diagnosis not present

## 2020-02-23 DIAGNOSIS — I8290 Acute embolism and thrombosis of unspecified vein: Secondary | ICD-10-CM | POA: Diagnosis not present

## 2020-02-23 DIAGNOSIS — S82891D Other fracture of right lower leg, subsequent encounter for closed fracture with routine healing: Secondary | ICD-10-CM | POA: Diagnosis not present

## 2020-02-23 DIAGNOSIS — R928 Other abnormal and inconclusive findings on diagnostic imaging of breast: Secondary | ICD-10-CM | POA: Diagnosis not present

## 2020-02-23 DIAGNOSIS — M19049 Primary osteoarthritis, unspecified hand: Secondary | ICD-10-CM | POA: Diagnosis not present

## 2020-02-23 DIAGNOSIS — D5919 Other autoimmune hemolytic anemia: Secondary | ICD-10-CM | POA: Diagnosis not present

## 2020-02-23 DIAGNOSIS — D649 Anemia, unspecified: Secondary | ICD-10-CM | POA: Diagnosis not present

## 2020-02-23 DIAGNOSIS — H0102A Squamous blepharitis right eye, upper and lower eyelids: Secondary | ICD-10-CM | POA: Diagnosis not present

## 2020-02-23 DIAGNOSIS — R451 Restlessness and agitation: Secondary | ICD-10-CM | POA: Diagnosis not present

## 2020-02-23 DIAGNOSIS — N342 Other urethritis: Secondary | ICD-10-CM | POA: Diagnosis not present

## 2020-02-23 DIAGNOSIS — I679 Cerebrovascular disease, unspecified: Secondary | ICD-10-CM | POA: Diagnosis not present

## 2020-02-23 DIAGNOSIS — M47817 Spondylosis without myelopathy or radiculopathy, lumbosacral region: Secondary | ICD-10-CM | POA: Diagnosis not present

## 2020-02-23 DIAGNOSIS — M4807 Spinal stenosis, lumbosacral region: Secondary | ICD-10-CM | POA: Diagnosis not present

## 2020-02-23 DIAGNOSIS — F251 Schizoaffective disorder, depressive type: Secondary | ICD-10-CM | POA: Diagnosis not present

## 2020-02-23 DIAGNOSIS — L720 Epidermal cyst: Secondary | ICD-10-CM | POA: Diagnosis not present

## 2020-02-23 DIAGNOSIS — N946 Dysmenorrhea, unspecified: Secondary | ICD-10-CM | POA: Diagnosis not present

## 2020-02-23 DIAGNOSIS — R45851 Suicidal ideations: Secondary | ICD-10-CM | POA: Diagnosis not present

## 2020-02-23 DIAGNOSIS — R410 Disorientation, unspecified: Secondary | ICD-10-CM | POA: Diagnosis not present

## 2020-02-23 DIAGNOSIS — B078 Other viral warts: Secondary | ICD-10-CM | POA: Diagnosis not present

## 2020-02-23 DIAGNOSIS — M5000 Cervical disc disorder with myelopathy, unspecified cervical region: Secondary | ICD-10-CM | POA: Diagnosis not present

## 2020-02-23 DIAGNOSIS — C538 Malignant neoplasm of overlapping sites of cervix uteri: Secondary | ICD-10-CM | POA: Diagnosis not present

## 2020-02-23 DIAGNOSIS — J340 Abscess, furuncle and carbuncle of nose: Secondary | ICD-10-CM | POA: Diagnosis not present

## 2020-02-23 DIAGNOSIS — M1711 Unilateral primary osteoarthritis, right knee: Secondary | ICD-10-CM | POA: Diagnosis not present

## 2020-02-23 DIAGNOSIS — G2581 Restless legs syndrome: Secondary | ICD-10-CM | POA: Diagnosis not present

## 2020-02-23 DIAGNOSIS — M85852 Other specified disorders of bone density and structure, left thigh: Secondary | ICD-10-CM | POA: Diagnosis not present

## 2020-02-23 DIAGNOSIS — Z955 Presence of coronary angioplasty implant and graft: Secondary | ICD-10-CM | POA: Diagnosis not present

## 2020-02-23 DIAGNOSIS — M25631 Stiffness of right wrist, not elsewhere classified: Secondary | ICD-10-CM | POA: Diagnosis not present

## 2020-02-23 DIAGNOSIS — J690 Pneumonitis due to inhalation of food and vomit: Secondary | ICD-10-CM | POA: Diagnosis not present

## 2020-02-23 DIAGNOSIS — D492 Neoplasm of unspecified behavior of bone, soft tissue, and skin: Secondary | ICD-10-CM | POA: Diagnosis not present

## 2020-02-23 DIAGNOSIS — R161 Splenomegaly, not elsewhere classified: Secondary | ICD-10-CM | POA: Diagnosis not present

## 2020-02-23 DIAGNOSIS — L02416 Cutaneous abscess of left lower limb: Secondary | ICD-10-CM | POA: Diagnosis not present

## 2020-02-23 DIAGNOSIS — R234 Changes in skin texture: Secondary | ICD-10-CM | POA: Diagnosis not present

## 2020-02-23 DIAGNOSIS — I2721 Secondary pulmonary arterial hypertension: Secondary | ICD-10-CM | POA: Diagnosis not present

## 2020-02-23 DIAGNOSIS — J069 Acute upper respiratory infection, unspecified: Secondary | ICD-10-CM | POA: Diagnosis not present

## 2020-02-23 DIAGNOSIS — S72002D Fracture of unspecified part of neck of left femur, subsequent encounter for closed fracture with routine healing: Secondary | ICD-10-CM | POA: Diagnosis not present

## 2020-02-23 DIAGNOSIS — G301 Alzheimer's disease with late onset: Secondary | ICD-10-CM | POA: Diagnosis not present

## 2020-02-23 DIAGNOSIS — K5900 Constipation, unspecified: Secondary | ICD-10-CM | POA: Diagnosis not present

## 2020-02-23 DIAGNOSIS — Z7189 Other specified counseling: Secondary | ICD-10-CM | POA: Diagnosis not present

## 2020-02-23 DIAGNOSIS — L853 Xerosis cutis: Secondary | ICD-10-CM | POA: Diagnosis not present

## 2020-02-23 DIAGNOSIS — M150 Primary generalized (osteo)arthritis: Secondary | ICD-10-CM | POA: Diagnosis not present

## 2020-02-23 DIAGNOSIS — M25462 Effusion, left knee: Secondary | ICD-10-CM | POA: Diagnosis not present

## 2020-02-23 DIAGNOSIS — K4090 Unilateral inguinal hernia, without obstruction or gangrene, not specified as recurrent: Secondary | ICD-10-CM | POA: Diagnosis not present

## 2020-02-23 DIAGNOSIS — L02212 Cutaneous abscess of back [any part, except buttock]: Secondary | ICD-10-CM | POA: Diagnosis not present

## 2020-02-23 DIAGNOSIS — D72828 Other elevated white blood cell count: Secondary | ICD-10-CM | POA: Diagnosis not present

## 2020-02-23 DIAGNOSIS — F1194 Opioid use, unspecified with opioid-induced mood disorder: Secondary | ICD-10-CM | POA: Diagnosis not present

## 2020-02-23 DIAGNOSIS — H539 Unspecified visual disturbance: Secondary | ICD-10-CM | POA: Diagnosis not present

## 2020-02-23 DIAGNOSIS — M75121 Complete rotator cuff tear or rupture of right shoulder, not specified as traumatic: Secondary | ICD-10-CM | POA: Diagnosis not present

## 2020-02-23 DIAGNOSIS — Z85528 Personal history of other malignant neoplasm of kidney: Secondary | ICD-10-CM | POA: Diagnosis not present

## 2020-02-23 DIAGNOSIS — Z9689 Presence of other specified functional implants: Secondary | ICD-10-CM | POA: Diagnosis not present

## 2020-02-23 DIAGNOSIS — Z8639 Personal history of other endocrine, nutritional and metabolic disease: Secondary | ICD-10-CM | POA: Diagnosis not present

## 2020-02-23 DIAGNOSIS — H43823 Vitreomacular adhesion, bilateral: Secondary | ICD-10-CM | POA: Diagnosis not present

## 2020-02-23 DIAGNOSIS — T6701XA Heatstroke and sunstroke, initial encounter: Secondary | ICD-10-CM | POA: Diagnosis not present

## 2020-02-23 DIAGNOSIS — R809 Proteinuria, unspecified: Secondary | ICD-10-CM | POA: Diagnosis not present

## 2020-02-23 DIAGNOSIS — I69392 Facial weakness following cerebral infarction: Secondary | ICD-10-CM | POA: Diagnosis not present

## 2020-02-23 DIAGNOSIS — K2970 Gastritis, unspecified, without bleeding: Secondary | ICD-10-CM | POA: Diagnosis not present

## 2020-02-23 DIAGNOSIS — Z832 Family history of diseases of the blood and blood-forming organs and certain disorders involving the immune mechanism: Secondary | ICD-10-CM | POA: Diagnosis not present

## 2020-02-23 DIAGNOSIS — J810 Acute pulmonary edema: Secondary | ICD-10-CM | POA: Diagnosis not present

## 2020-02-23 DIAGNOSIS — E860 Dehydration: Secondary | ICD-10-CM | POA: Diagnosis not present

## 2020-02-23 DIAGNOSIS — J029 Acute pharyngitis, unspecified: Secondary | ICD-10-CM | POA: Diagnosis not present

## 2020-02-23 DIAGNOSIS — R519 Headache, unspecified: Secondary | ICD-10-CM | POA: Diagnosis not present

## 2020-02-23 DIAGNOSIS — M79605 Pain in left leg: Secondary | ICD-10-CM | POA: Diagnosis not present

## 2020-02-23 DIAGNOSIS — K449 Diaphragmatic hernia without obstruction or gangrene: Secondary | ICD-10-CM | POA: Diagnosis not present

## 2020-02-23 DIAGNOSIS — M5137 Other intervertebral disc degeneration, lumbosacral region: Secondary | ICD-10-CM | POA: Diagnosis not present

## 2020-02-23 DIAGNOSIS — R638 Other symptoms and signs concerning food and fluid intake: Secondary | ICD-10-CM | POA: Diagnosis not present

## 2020-02-23 DIAGNOSIS — I498 Other specified cardiac arrhythmias: Secondary | ICD-10-CM | POA: Diagnosis not present

## 2020-02-23 DIAGNOSIS — J479 Bronchiectasis, uncomplicated: Secondary | ICD-10-CM | POA: Diagnosis not present

## 2020-02-23 DIAGNOSIS — E079 Disorder of thyroid, unspecified: Secondary | ICD-10-CM | POA: Diagnosis not present

## 2020-02-23 DIAGNOSIS — R630 Anorexia: Secondary | ICD-10-CM | POA: Diagnosis not present

## 2020-02-23 DIAGNOSIS — S43421A Sprain of right rotator cuff capsule, initial encounter: Secondary | ICD-10-CM | POA: Diagnosis not present

## 2020-02-23 DIAGNOSIS — K2100 Gastro-esophageal reflux disease with esophagitis, without bleeding: Secondary | ICD-10-CM | POA: Diagnosis not present

## 2020-02-23 DIAGNOSIS — T86828 Other complications of skin graft (allograft) (autograft): Secondary | ICD-10-CM | POA: Diagnosis not present

## 2020-02-23 DIAGNOSIS — Z1272 Encounter for screening for malignant neoplasm of vagina: Secondary | ICD-10-CM | POA: Diagnosis not present

## 2020-02-23 DIAGNOSIS — R9341 Abnormal radiologic findings on diagnostic imaging of renal pelvis, ureter, or bladder: Secondary | ICD-10-CM | POA: Diagnosis not present

## 2020-02-23 DIAGNOSIS — R1110 Vomiting, unspecified: Secondary | ICD-10-CM | POA: Diagnosis not present

## 2020-02-23 DIAGNOSIS — L03031 Cellulitis of right toe: Secondary | ICD-10-CM | POA: Diagnosis not present

## 2020-02-23 DIAGNOSIS — Z6827 Body mass index (BMI) 27.0-27.9, adult: Secondary | ICD-10-CM | POA: Diagnosis not present

## 2020-02-23 DIAGNOSIS — K30 Functional dyspepsia: Secondary | ICD-10-CM | POA: Diagnosis not present

## 2020-02-23 DIAGNOSIS — R2681 Unsteadiness on feet: Secondary | ICD-10-CM | POA: Diagnosis not present

## 2020-02-23 DIAGNOSIS — H353132 Nonexudative age-related macular degeneration, bilateral, intermediate dry stage: Secondary | ICD-10-CM | POA: Diagnosis not present

## 2020-02-23 DIAGNOSIS — H25813 Combined forms of age-related cataract, bilateral: Secondary | ICD-10-CM | POA: Diagnosis not present

## 2020-02-23 DIAGNOSIS — J328 Other chronic sinusitis: Secondary | ICD-10-CM | POA: Diagnosis not present

## 2020-02-23 DIAGNOSIS — Z87440 Personal history of urinary (tract) infections: Secondary | ICD-10-CM | POA: Diagnosis not present

## 2020-02-23 DIAGNOSIS — S8265XA Nondisplaced fracture of lateral malleolus of left fibula, initial encounter for closed fracture: Secondary | ICD-10-CM | POA: Diagnosis not present

## 2020-02-23 DIAGNOSIS — L218 Other seborrheic dermatitis: Secondary | ICD-10-CM | POA: Diagnosis not present

## 2020-02-23 DIAGNOSIS — Z89622 Acquired absence of left hip joint: Secondary | ICD-10-CM | POA: Diagnosis not present

## 2020-02-23 DIAGNOSIS — K900 Celiac disease: Secondary | ICD-10-CM | POA: Diagnosis not present

## 2020-02-23 DIAGNOSIS — L932 Other local lupus erythematosus: Secondary | ICD-10-CM | POA: Diagnosis not present

## 2020-02-23 DIAGNOSIS — D229 Melanocytic nevi, unspecified: Secondary | ICD-10-CM | POA: Diagnosis not present

## 2020-02-23 DIAGNOSIS — Z780 Asymptomatic menopausal state: Secondary | ICD-10-CM | POA: Diagnosis not present

## 2020-02-23 DIAGNOSIS — R635 Abnormal weight gain: Secondary | ICD-10-CM | POA: Diagnosis not present

## 2020-02-23 DIAGNOSIS — L03113 Cellulitis of right upper limb: Secondary | ICD-10-CM | POA: Diagnosis not present

## 2020-02-23 DIAGNOSIS — Z885 Allergy status to narcotic agent status: Secondary | ICD-10-CM | POA: Diagnosis not present

## 2020-02-23 DIAGNOSIS — M0579 Rheumatoid arthritis with rheumatoid factor of multiple sites without organ or systems involvement: Secondary | ICD-10-CM | POA: Diagnosis not present

## 2020-02-23 DIAGNOSIS — J4530 Mild persistent asthma, uncomplicated: Secondary | ICD-10-CM | POA: Diagnosis not present

## 2020-02-23 DIAGNOSIS — G4730 Sleep apnea, unspecified: Secondary | ICD-10-CM | POA: Diagnosis not present

## 2020-02-23 DIAGNOSIS — G629 Polyneuropathy, unspecified: Secondary | ICD-10-CM | POA: Diagnosis not present

## 2020-02-23 DIAGNOSIS — Z9682 Presence of neurostimulator: Secondary | ICD-10-CM | POA: Diagnosis not present

## 2020-02-23 DIAGNOSIS — L309 Dermatitis, unspecified: Secondary | ICD-10-CM | POA: Diagnosis not present

## 2020-02-23 DIAGNOSIS — J449 Chronic obstructive pulmonary disease, unspecified: Secondary | ICD-10-CM | POA: Diagnosis not present

## 2020-02-23 DIAGNOSIS — E1129 Type 2 diabetes mellitus with other diabetic kidney complication: Secondary | ICD-10-CM | POA: Diagnosis not present

## 2020-02-23 DIAGNOSIS — M25611 Stiffness of right shoulder, not elsewhere classified: Secondary | ICD-10-CM | POA: Diagnosis not present

## 2020-02-23 DIAGNOSIS — E6609 Other obesity due to excess calories: Secondary | ICD-10-CM | POA: Diagnosis not present

## 2020-02-23 DIAGNOSIS — L89140 Pressure ulcer of left lower back, unstageable: Secondary | ICD-10-CM | POA: Diagnosis not present

## 2020-02-23 DIAGNOSIS — Z902 Acquired absence of lung [part of]: Secondary | ICD-10-CM | POA: Diagnosis not present

## 2020-02-23 DIAGNOSIS — H401131 Primary open-angle glaucoma, bilateral, mild stage: Secondary | ICD-10-CM | POA: Diagnosis not present

## 2020-02-23 DIAGNOSIS — Z8614 Personal history of Methicillin resistant Staphylococcus aureus infection: Secondary | ICD-10-CM | POA: Diagnosis not present

## 2020-02-23 DIAGNOSIS — E7849 Other hyperlipidemia: Secondary | ICD-10-CM | POA: Diagnosis not present

## 2020-02-23 DIAGNOSIS — D0471 Carcinoma in situ of skin of right lower limb, including hip: Secondary | ICD-10-CM | POA: Diagnosis not present

## 2020-02-23 DIAGNOSIS — Z823 Family history of stroke: Secondary | ICD-10-CM | POA: Diagnosis not present

## 2020-02-23 DIAGNOSIS — M1811 Unilateral primary osteoarthritis of first carpometacarpal joint, right hand: Secondary | ICD-10-CM | POA: Diagnosis not present

## 2020-02-23 DIAGNOSIS — J309 Allergic rhinitis, unspecified: Secondary | ICD-10-CM | POA: Diagnosis not present

## 2020-02-23 DIAGNOSIS — T8459XA Infection and inflammatory reaction due to other internal joint prosthesis, initial encounter: Secondary | ICD-10-CM | POA: Diagnosis not present

## 2020-02-23 DIAGNOSIS — H209 Unspecified iridocyclitis: Secondary | ICD-10-CM | POA: Diagnosis not present

## 2020-02-23 DIAGNOSIS — D6959 Other secondary thrombocytopenia: Secondary | ICD-10-CM | POA: Diagnosis not present

## 2020-02-23 DIAGNOSIS — U071 COVID-19: Secondary | ICD-10-CM | POA: Diagnosis not present

## 2020-02-23 DIAGNOSIS — Z7689 Persons encountering health services in other specified circumstances: Secondary | ICD-10-CM | POA: Diagnosis not present

## 2020-02-23 DIAGNOSIS — M25572 Pain in left ankle and joints of left foot: Secondary | ICD-10-CM | POA: Diagnosis not present

## 2020-02-23 DIAGNOSIS — I872 Venous insufficiency (chronic) (peripheral): Secondary | ICD-10-CM | POA: Diagnosis not present

## 2020-02-23 DIAGNOSIS — C210 Malignant neoplasm of anus, unspecified: Secondary | ICD-10-CM | POA: Diagnosis not present

## 2020-02-23 DIAGNOSIS — I358 Other nonrheumatic aortic valve disorders: Secondary | ICD-10-CM | POA: Diagnosis not present

## 2020-02-23 DIAGNOSIS — S72001A Fracture of unspecified part of neck of right femur, initial encounter for closed fracture: Secondary | ICD-10-CM | POA: Diagnosis not present

## 2020-02-23 DIAGNOSIS — R7989 Other specified abnormal findings of blood chemistry: Secondary | ICD-10-CM | POA: Diagnosis not present

## 2020-02-23 DIAGNOSIS — J84112 Idiopathic pulmonary fibrosis: Secondary | ICD-10-CM | POA: Diagnosis not present

## 2020-02-23 DIAGNOSIS — M5441 Lumbago with sciatica, right side: Secondary | ICD-10-CM | POA: Diagnosis not present

## 2020-02-23 DIAGNOSIS — E5111 Dry beriberi: Secondary | ICD-10-CM | POA: Diagnosis not present

## 2020-02-23 DIAGNOSIS — R12 Heartburn: Secondary | ICD-10-CM | POA: Diagnosis not present

## 2020-02-23 DIAGNOSIS — L918 Other hypertrophic disorders of the skin: Secondary | ICD-10-CM | POA: Diagnosis not present

## 2020-02-23 DIAGNOSIS — M62830 Muscle spasm of back: Secondary | ICD-10-CM | POA: Diagnosis not present

## 2020-02-23 DIAGNOSIS — K2950 Unspecified chronic gastritis without bleeding: Secondary | ICD-10-CM | POA: Diagnosis not present

## 2020-02-23 DIAGNOSIS — I208 Other forms of angina pectoris: Secondary | ICD-10-CM | POA: Diagnosis not present

## 2020-02-23 DIAGNOSIS — C7B09 Secondary carcinoid tumors of other sites: Secondary | ICD-10-CM | POA: Diagnosis not present

## 2020-02-23 DIAGNOSIS — R413 Other amnesia: Secondary | ICD-10-CM | POA: Diagnosis not present

## 2020-02-23 DIAGNOSIS — I69351 Hemiplegia and hemiparesis following cerebral infarction affecting right dominant side: Secondary | ICD-10-CM | POA: Diagnosis not present

## 2020-02-23 DIAGNOSIS — Z953 Presence of xenogenic heart valve: Secondary | ICD-10-CM | POA: Diagnosis not present

## 2020-02-23 DIAGNOSIS — M25531 Pain in right wrist: Secondary | ICD-10-CM | POA: Diagnosis not present

## 2020-02-23 DIAGNOSIS — M25372 Other instability, left ankle: Secondary | ICD-10-CM | POA: Diagnosis not present

## 2020-02-23 DIAGNOSIS — M12811 Other specific arthropathies, not elsewhere classified, right shoulder: Secondary | ICD-10-CM | POA: Diagnosis not present

## 2020-02-23 DIAGNOSIS — Z6834 Body mass index (BMI) 34.0-34.9, adult: Secondary | ICD-10-CM | POA: Diagnosis not present

## 2020-02-23 DIAGNOSIS — S72114D Nondisplaced fracture of greater trochanter of right femur, subsequent encounter for closed fracture with routine healing: Secondary | ICD-10-CM | POA: Diagnosis not present

## 2020-02-23 DIAGNOSIS — H35362 Drusen (degenerative) of macula, left eye: Secondary | ICD-10-CM | POA: Diagnosis not present

## 2020-02-23 DIAGNOSIS — D500 Iron deficiency anemia secondary to blood loss (chronic): Secondary | ICD-10-CM | POA: Diagnosis not present

## 2020-02-23 DIAGNOSIS — Z170 Estrogen receptor positive status [ER+]: Secondary | ICD-10-CM | POA: Diagnosis not present

## 2020-02-23 DIAGNOSIS — D682 Hereditary deficiency of other clotting factors: Secondary | ICD-10-CM | POA: Diagnosis not present

## 2020-02-23 DIAGNOSIS — K7460 Unspecified cirrhosis of liver: Secondary | ICD-10-CM | POA: Diagnosis not present

## 2020-02-23 DIAGNOSIS — B0229 Other postherpetic nervous system involvement: Secondary | ICD-10-CM | POA: Diagnosis not present

## 2020-02-23 DIAGNOSIS — Z8052 Family history of malignant neoplasm of bladder: Secondary | ICD-10-CM | POA: Diagnosis not present

## 2020-02-23 DIAGNOSIS — R0902 Hypoxemia: Secondary | ICD-10-CM | POA: Diagnosis not present

## 2020-02-23 DIAGNOSIS — G4700 Insomnia, unspecified: Secondary | ICD-10-CM | POA: Diagnosis not present

## 2020-02-23 DIAGNOSIS — K529 Noninfective gastroenteritis and colitis, unspecified: Secondary | ICD-10-CM | POA: Diagnosis not present

## 2020-02-23 DIAGNOSIS — H353113 Nonexudative age-related macular degeneration, right eye, advanced atrophic without subfoveal involvement: Secondary | ICD-10-CM | POA: Diagnosis not present

## 2020-02-23 DIAGNOSIS — J189 Pneumonia, unspecified organism: Secondary | ICD-10-CM | POA: Diagnosis not present

## 2020-02-23 DIAGNOSIS — I38 Endocarditis, valve unspecified: Secondary | ICD-10-CM | POA: Diagnosis not present

## 2020-02-23 DIAGNOSIS — E162 Hypoglycemia, unspecified: Secondary | ICD-10-CM | POA: Diagnosis not present

## 2020-02-23 DIAGNOSIS — M069 Rheumatoid arthritis, unspecified: Secondary | ICD-10-CM | POA: Diagnosis not present

## 2020-02-23 DIAGNOSIS — I2510 Atherosclerotic heart disease of native coronary artery without angina pectoris: Secondary | ICD-10-CM | POA: Diagnosis not present

## 2020-02-23 DIAGNOSIS — L989 Disorder of the skin and subcutaneous tissue, unspecified: Secondary | ICD-10-CM | POA: Diagnosis not present

## 2020-02-23 DIAGNOSIS — M47814 Spondylosis without myelopathy or radiculopathy, thoracic region: Secondary | ICD-10-CM | POA: Diagnosis not present

## 2020-02-23 DIAGNOSIS — C250 Malignant neoplasm of head of pancreas: Secondary | ICD-10-CM | POA: Diagnosis not present

## 2020-02-23 DIAGNOSIS — M79604 Pain in right leg: Secondary | ICD-10-CM | POA: Diagnosis not present

## 2020-02-23 DIAGNOSIS — S62636D Displaced fracture of distal phalanx of right little finger, subsequent encounter for fracture with routine healing: Secondary | ICD-10-CM | POA: Diagnosis not present

## 2020-02-23 DIAGNOSIS — H401133 Primary open-angle glaucoma, bilateral, severe stage: Secondary | ICD-10-CM | POA: Diagnosis not present

## 2020-02-23 DIAGNOSIS — R29898 Other symptoms and signs involving the musculoskeletal system: Secondary | ICD-10-CM | POA: Diagnosis not present

## 2020-02-23 DIAGNOSIS — D849 Immunodeficiency, unspecified: Secondary | ICD-10-CM | POA: Diagnosis not present

## 2020-02-23 DIAGNOSIS — M19072 Primary osteoarthritis, left ankle and foot: Secondary | ICD-10-CM | POA: Diagnosis not present

## 2020-02-23 DIAGNOSIS — M50321 Other cervical disc degeneration at C4-C5 level: Secondary | ICD-10-CM | POA: Diagnosis not present

## 2020-02-23 DIAGNOSIS — Z515 Encounter for palliative care: Secondary | ICD-10-CM | POA: Diagnosis not present

## 2020-02-23 DIAGNOSIS — L821 Other seborrheic keratosis: Secondary | ICD-10-CM | POA: Diagnosis not present

## 2020-02-23 DIAGNOSIS — W19XXXA Unspecified fall, initial encounter: Secondary | ICD-10-CM | POA: Diagnosis not present

## 2020-02-23 DIAGNOSIS — C7951 Secondary malignant neoplasm of bone: Secondary | ICD-10-CM | POA: Diagnosis not present

## 2020-02-23 DIAGNOSIS — I493 Ventricular premature depolarization: Secondary | ICD-10-CM | POA: Diagnosis not present

## 2020-02-23 DIAGNOSIS — H35033 Hypertensive retinopathy, bilateral: Secondary | ICD-10-CM | POA: Diagnosis not present

## 2020-02-23 DIAGNOSIS — R112 Nausea with vomiting, unspecified: Secondary | ICD-10-CM | POA: Diagnosis not present

## 2020-02-23 DIAGNOSIS — H4720 Unspecified optic atrophy: Secondary | ICD-10-CM | POA: Diagnosis not present

## 2020-02-23 DIAGNOSIS — H40013 Open angle with borderline findings, low risk, bilateral: Secondary | ICD-10-CM | POA: Diagnosis not present

## 2020-02-23 DIAGNOSIS — F900 Attention-deficit hyperactivity disorder, predominantly inattentive type: Secondary | ICD-10-CM | POA: Diagnosis not present

## 2020-02-23 DIAGNOSIS — E063 Autoimmune thyroiditis: Secondary | ICD-10-CM | POA: Diagnosis not present

## 2020-02-23 DIAGNOSIS — C679 Malignant neoplasm of bladder, unspecified: Secondary | ICD-10-CM | POA: Diagnosis not present

## 2020-02-23 DIAGNOSIS — G8918 Other acute postprocedural pain: Secondary | ICD-10-CM | POA: Diagnosis not present

## 2020-02-23 DIAGNOSIS — M5116 Intervertebral disc disorders with radiculopathy, lumbar region: Secondary | ICD-10-CM | POA: Diagnosis not present

## 2020-02-23 DIAGNOSIS — R200 Anesthesia of skin: Secondary | ICD-10-CM | POA: Diagnosis not present

## 2020-02-23 DIAGNOSIS — L97312 Non-pressure chronic ulcer of right ankle with fat layer exposed: Secondary | ICD-10-CM | POA: Diagnosis not present

## 2020-02-23 DIAGNOSIS — Z882 Allergy status to sulfonamides status: Secondary | ICD-10-CM | POA: Diagnosis not present

## 2020-02-23 DIAGNOSIS — I6389 Other cerebral infarction: Secondary | ICD-10-CM | POA: Diagnosis not present

## 2020-02-23 DIAGNOSIS — Z1152 Encounter for screening for COVID-19: Secondary | ICD-10-CM | POA: Diagnosis not present

## 2020-02-23 DIAGNOSIS — Z5111 Encounter for antineoplastic chemotherapy: Secondary | ICD-10-CM | POA: Diagnosis not present

## 2020-02-23 DIAGNOSIS — R1013 Epigastric pain: Secondary | ICD-10-CM | POA: Diagnosis not present

## 2020-02-23 DIAGNOSIS — K551 Chronic vascular disorders of intestine: Secondary | ICD-10-CM | POA: Diagnosis not present

## 2020-02-23 DIAGNOSIS — R400 Somnolence: Secondary | ICD-10-CM | POA: Diagnosis not present

## 2020-02-23 DIAGNOSIS — R5381 Other malaise: Secondary | ICD-10-CM | POA: Diagnosis not present

## 2020-02-23 DIAGNOSIS — L089 Local infection of the skin and subcutaneous tissue, unspecified: Secondary | ICD-10-CM | POA: Diagnosis not present

## 2020-02-23 DIAGNOSIS — D890 Polyclonal hypergammaglobulinemia: Secondary | ICD-10-CM | POA: Diagnosis not present

## 2020-02-23 DIAGNOSIS — M791 Myalgia, unspecified site: Secondary | ICD-10-CM | POA: Diagnosis not present

## 2020-02-23 DIAGNOSIS — B259 Cytomegaloviral disease, unspecified: Secondary | ICD-10-CM | POA: Diagnosis not present

## 2020-02-23 DIAGNOSIS — E669 Obesity, unspecified: Secondary | ICD-10-CM | POA: Diagnosis not present

## 2020-02-23 DIAGNOSIS — H35373 Puckering of macula, bilateral: Secondary | ICD-10-CM | POA: Diagnosis not present

## 2020-02-23 DIAGNOSIS — D225 Melanocytic nevi of trunk: Secondary | ICD-10-CM | POA: Diagnosis not present

## 2020-02-23 DIAGNOSIS — I340 Nonrheumatic mitral (valve) insufficiency: Secondary | ICD-10-CM | POA: Diagnosis not present

## 2020-02-23 DIAGNOSIS — Z66 Do not resuscitate: Secondary | ICD-10-CM | POA: Diagnosis not present

## 2020-02-23 DIAGNOSIS — D122 Benign neoplasm of ascending colon: Secondary | ICD-10-CM | POA: Diagnosis not present

## 2020-02-23 DIAGNOSIS — T383X6A Underdosing of insulin and oral hypoglycemic [antidiabetic] drugs, initial encounter: Secondary | ICD-10-CM | POA: Diagnosis not present

## 2020-02-23 DIAGNOSIS — S81801A Unspecified open wound, right lower leg, initial encounter: Secondary | ICD-10-CM | POA: Diagnosis not present

## 2020-02-23 DIAGNOSIS — D696 Thrombocytopenia, unspecified: Secondary | ICD-10-CM | POA: Diagnosis not present

## 2020-02-23 DIAGNOSIS — M2550 Pain in unspecified joint: Secondary | ICD-10-CM | POA: Diagnosis not present

## 2020-02-23 DIAGNOSIS — Z79890 Hormone replacement therapy (postmenopausal): Secondary | ICD-10-CM | POA: Diagnosis not present

## 2020-02-23 DIAGNOSIS — L03312 Cellulitis of back [any part except buttock]: Secondary | ICD-10-CM | POA: Diagnosis not present

## 2020-02-23 DIAGNOSIS — Z8616 Personal history of COVID-19: Secondary | ICD-10-CM | POA: Diagnosis not present

## 2020-02-23 DIAGNOSIS — E871 Hypo-osmolality and hyponatremia: Secondary | ICD-10-CM | POA: Diagnosis not present

## 2020-02-23 DIAGNOSIS — R6520 Severe sepsis without septic shock: Secondary | ICD-10-CM | POA: Diagnosis not present

## 2020-02-23 DIAGNOSIS — C7B04 Secondary carcinoid tumors of peritoneum: Secondary | ICD-10-CM | POA: Diagnosis not present

## 2020-02-23 DIAGNOSIS — D539 Nutritional anemia, unspecified: Secondary | ICD-10-CM | POA: Diagnosis not present

## 2020-02-23 DIAGNOSIS — T8484XA Pain due to internal orthopedic prosthetic devices, implants and grafts, initial encounter: Secondary | ICD-10-CM | POA: Diagnosis not present

## 2020-02-23 DIAGNOSIS — M9903 Segmental and somatic dysfunction of lumbar region: Secondary | ICD-10-CM | POA: Diagnosis not present

## 2020-02-23 DIAGNOSIS — K869 Disease of pancreas, unspecified: Secondary | ICD-10-CM | POA: Diagnosis not present

## 2020-02-23 DIAGNOSIS — Q231 Congenital insufficiency of aortic valve: Secondary | ICD-10-CM | POA: Diagnosis not present

## 2020-02-23 DIAGNOSIS — J3081 Allergic rhinitis due to animal (cat) (dog) hair and dander: Secondary | ICD-10-CM | POA: Diagnosis not present

## 2020-02-23 DIAGNOSIS — R590 Localized enlarged lymph nodes: Secondary | ICD-10-CM | POA: Diagnosis not present

## 2020-02-23 DIAGNOSIS — J3489 Other specified disorders of nose and nasal sinuses: Secondary | ICD-10-CM | POA: Diagnosis not present

## 2020-02-23 DIAGNOSIS — I83013 Varicose veins of right lower extremity with ulcer of ankle: Secondary | ICD-10-CM | POA: Diagnosis not present

## 2020-02-23 DIAGNOSIS — E1040 Type 1 diabetes mellitus with diabetic neuropathy, unspecified: Secondary | ICD-10-CM | POA: Diagnosis not present

## 2020-02-23 DIAGNOSIS — K7469 Other cirrhosis of liver: Secondary | ICD-10-CM | POA: Diagnosis not present

## 2020-02-23 DIAGNOSIS — C786 Secondary malignant neoplasm of retroperitoneum and peritoneum: Secondary | ICD-10-CM | POA: Diagnosis not present

## 2020-02-23 DIAGNOSIS — E034 Atrophy of thyroid (acquired): Secondary | ICD-10-CM | POA: Diagnosis not present

## 2020-02-23 DIAGNOSIS — M7918 Myalgia, other site: Secondary | ICD-10-CM | POA: Diagnosis not present

## 2020-02-23 DIAGNOSIS — M955 Acquired deformity of pelvis: Secondary | ICD-10-CM | POA: Diagnosis not present

## 2020-02-23 DIAGNOSIS — M4802 Spinal stenosis, cervical region: Secondary | ICD-10-CM | POA: Diagnosis not present

## 2020-02-23 DIAGNOSIS — M9904 Segmental and somatic dysfunction of sacral region: Secondary | ICD-10-CM | POA: Diagnosis not present

## 2020-02-23 DIAGNOSIS — Z89611 Acquired absence of right leg above knee: Secondary | ICD-10-CM | POA: Diagnosis not present

## 2020-02-23 DIAGNOSIS — R443 Hallucinations, unspecified: Secondary | ICD-10-CM | POA: Diagnosis not present

## 2020-02-23 DIAGNOSIS — Z90710 Acquired absence of both cervix and uterus: Secondary | ICD-10-CM | POA: Diagnosis not present

## 2020-02-23 DIAGNOSIS — E113293 Type 2 diabetes mellitus with mild nonproliferative diabetic retinopathy without macular edema, bilateral: Secondary | ICD-10-CM | POA: Diagnosis not present

## 2020-02-23 DIAGNOSIS — R1030 Lower abdominal pain, unspecified: Secondary | ICD-10-CM | POA: Diagnosis not present

## 2020-02-23 DIAGNOSIS — Z803 Family history of malignant neoplasm of breast: Secondary | ICD-10-CM | POA: Diagnosis not present

## 2020-02-23 DIAGNOSIS — B182 Chronic viral hepatitis C: Secondary | ICD-10-CM | POA: Diagnosis not present

## 2020-02-23 DIAGNOSIS — F439 Reaction to severe stress, unspecified: Secondary | ICD-10-CM | POA: Diagnosis not present

## 2020-02-23 DIAGNOSIS — I825Y1 Chronic embolism and thrombosis of unspecified deep veins of right proximal lower extremity: Secondary | ICD-10-CM | POA: Diagnosis not present

## 2020-02-23 DIAGNOSIS — R748 Abnormal levels of other serum enzymes: Secondary | ICD-10-CM | POA: Diagnosis not present

## 2020-02-23 DIAGNOSIS — R8290 Unspecified abnormal findings in urine: Secondary | ICD-10-CM | POA: Diagnosis not present

## 2020-02-23 DIAGNOSIS — D126 Benign neoplasm of colon, unspecified: Secondary | ICD-10-CM | POA: Diagnosis not present

## 2020-02-23 DIAGNOSIS — Z8719 Personal history of other diseases of the digestive system: Secondary | ICD-10-CM | POA: Diagnosis not present

## 2020-02-23 DIAGNOSIS — R58 Hemorrhage, not elsewhere classified: Secondary | ICD-10-CM | POA: Diagnosis not present

## 2020-02-23 DIAGNOSIS — E611 Iron deficiency: Secondary | ICD-10-CM | POA: Diagnosis not present

## 2020-02-23 DIAGNOSIS — F320 Major depressive disorder, single episode, mild: Secondary | ICD-10-CM | POA: Diagnosis not present

## 2020-02-23 DIAGNOSIS — M461 Sacroiliitis, not elsewhere classified: Secondary | ICD-10-CM | POA: Diagnosis not present

## 2020-02-23 DIAGNOSIS — F331 Major depressive disorder, recurrent, moderate: Secondary | ICD-10-CM | POA: Diagnosis not present

## 2020-02-23 DIAGNOSIS — Q828 Other specified congenital malformations of skin: Secondary | ICD-10-CM | POA: Diagnosis not present

## 2020-02-23 DIAGNOSIS — J90 Pleural effusion, not elsewhere classified: Secondary | ICD-10-CM | POA: Diagnosis not present

## 2020-02-23 DIAGNOSIS — F1120 Opioid dependence, uncomplicated: Secondary | ICD-10-CM | POA: Diagnosis not present

## 2020-02-23 DIAGNOSIS — Z91041 Radiographic dye allergy status: Secondary | ICD-10-CM | POA: Diagnosis not present

## 2020-02-23 DIAGNOSIS — J019 Acute sinusitis, unspecified: Secondary | ICD-10-CM | POA: Diagnosis not present

## 2020-02-23 DIAGNOSIS — C50912 Malignant neoplasm of unspecified site of left female breast: Secondary | ICD-10-CM | POA: Diagnosis not present

## 2020-02-23 DIAGNOSIS — K649 Unspecified hemorrhoids: Secondary | ICD-10-CM | POA: Diagnosis not present

## 2020-02-23 DIAGNOSIS — K922 Gastrointestinal hemorrhage, unspecified: Secondary | ICD-10-CM | POA: Diagnosis not present

## 2020-02-23 DIAGNOSIS — M5430 Sciatica, unspecified side: Secondary | ICD-10-CM | POA: Diagnosis not present

## 2020-02-23 DIAGNOSIS — Z96642 Presence of left artificial hip joint: Secondary | ICD-10-CM | POA: Diagnosis not present

## 2020-02-23 DIAGNOSIS — E113411 Type 2 diabetes mellitus with severe nonproliferative diabetic retinopathy with macular edema, right eye: Secondary | ICD-10-CM | POA: Diagnosis not present

## 2020-02-23 DIAGNOSIS — M06031 Rheumatoid arthritis without rheumatoid factor, right wrist: Secondary | ICD-10-CM | POA: Diagnosis not present

## 2020-02-23 DIAGNOSIS — Z8679 Personal history of other diseases of the circulatory system: Secondary | ICD-10-CM | POA: Diagnosis not present

## 2020-02-23 DIAGNOSIS — K589 Irritable bowel syndrome without diarrhea: Secondary | ICD-10-CM | POA: Diagnosis not present

## 2020-02-23 DIAGNOSIS — M2042 Other hammer toe(s) (acquired), left foot: Secondary | ICD-10-CM | POA: Diagnosis not present

## 2020-02-23 DIAGNOSIS — I255 Ischemic cardiomyopathy: Secondary | ICD-10-CM | POA: Diagnosis not present

## 2020-02-23 DIAGNOSIS — E049 Nontoxic goiter, unspecified: Secondary | ICD-10-CM | POA: Diagnosis not present

## 2020-02-23 DIAGNOSIS — R1314 Dysphagia, pharyngoesophageal phase: Secondary | ICD-10-CM | POA: Diagnosis not present

## 2020-02-23 DIAGNOSIS — E870 Hyperosmolality and hypernatremia: Secondary | ICD-10-CM | POA: Diagnosis not present

## 2020-02-23 DIAGNOSIS — I714 Abdominal aortic aneurysm, without rupture: Secondary | ICD-10-CM | POA: Diagnosis not present

## 2020-02-23 DIAGNOSIS — Z978 Presence of other specified devices: Secondary | ICD-10-CM | POA: Diagnosis not present

## 2020-02-23 DIAGNOSIS — H8113 Benign paroxysmal vertigo, bilateral: Secondary | ICD-10-CM | POA: Diagnosis not present

## 2020-02-23 DIAGNOSIS — K5909 Other constipation: Secondary | ICD-10-CM | POA: Diagnosis not present

## 2020-02-23 DIAGNOSIS — R404 Transient alteration of awareness: Secondary | ICD-10-CM | POA: Diagnosis not present

## 2020-02-23 DIAGNOSIS — S20161A Insect bite (nonvenomous) of breast, right breast, initial encounter: Secondary | ICD-10-CM | POA: Diagnosis not present

## 2020-02-23 DIAGNOSIS — I080 Rheumatic disorders of both mitral and aortic valves: Secondary | ICD-10-CM | POA: Diagnosis not present

## 2020-02-23 DIAGNOSIS — Q2546 Tortuous aortic arch: Secondary | ICD-10-CM | POA: Diagnosis not present

## 2020-02-23 DIAGNOSIS — Z85110 Personal history of malignant carcinoid tumor of bronchus and lung: Secondary | ICD-10-CM | POA: Diagnosis not present

## 2020-02-23 DIAGNOSIS — R6889 Other general symptoms and signs: Secondary | ICD-10-CM | POA: Diagnosis not present

## 2020-02-23 DIAGNOSIS — M869 Osteomyelitis, unspecified: Secondary | ICD-10-CM | POA: Diagnosis not present

## 2020-02-23 DIAGNOSIS — N2589 Other disorders resulting from impaired renal tubular function: Secondary | ICD-10-CM | POA: Diagnosis not present

## 2020-02-23 DIAGNOSIS — H40012 Open angle with borderline findings, low risk, left eye: Secondary | ICD-10-CM | POA: Diagnosis not present

## 2020-02-23 DIAGNOSIS — I426 Alcoholic cardiomyopathy: Secondary | ICD-10-CM | POA: Diagnosis not present

## 2020-02-23 DIAGNOSIS — T148XXA Other injury of unspecified body region, initial encounter: Secondary | ICD-10-CM | POA: Diagnosis not present

## 2020-02-23 DIAGNOSIS — M79645 Pain in left finger(s): Secondary | ICD-10-CM | POA: Diagnosis not present

## 2020-02-23 DIAGNOSIS — R195 Other fecal abnormalities: Secondary | ICD-10-CM | POA: Diagnosis not present

## 2020-02-23 DIAGNOSIS — C7B01 Secondary carcinoid tumors of distant lymph nodes: Secondary | ICD-10-CM | POA: Diagnosis not present

## 2020-02-23 DIAGNOSIS — G4731 Primary central sleep apnea: Secondary | ICD-10-CM | POA: Diagnosis not present

## 2020-02-23 DIAGNOSIS — I4821 Permanent atrial fibrillation: Secondary | ICD-10-CM | POA: Diagnosis not present

## 2020-02-23 DIAGNOSIS — K635 Polyp of colon: Secondary | ICD-10-CM | POA: Diagnosis not present

## 2020-02-23 DIAGNOSIS — I70248 Atherosclerosis of native arteries of left leg with ulceration of other part of lower left leg: Secondary | ICD-10-CM | POA: Diagnosis not present

## 2020-02-23 DIAGNOSIS — J4520 Mild intermittent asthma, uncomplicated: Secondary | ICD-10-CM | POA: Diagnosis not present

## 2020-02-23 DIAGNOSIS — A498 Other bacterial infections of unspecified site: Secondary | ICD-10-CM | POA: Diagnosis not present

## 2020-02-23 DIAGNOSIS — R279 Unspecified lack of coordination: Secondary | ICD-10-CM | POA: Diagnosis not present

## 2020-02-23 DIAGNOSIS — M85832 Other specified disorders of bone density and structure, left forearm: Secondary | ICD-10-CM | POA: Diagnosis not present

## 2020-02-23 DIAGNOSIS — Z136 Encounter for screening for cardiovascular disorders: Secondary | ICD-10-CM | POA: Diagnosis not present

## 2020-02-23 DIAGNOSIS — H25013 Cortical age-related cataract, bilateral: Secondary | ICD-10-CM | POA: Diagnosis not present

## 2020-02-23 DIAGNOSIS — E1029 Type 1 diabetes mellitus with other diabetic kidney complication: Secondary | ICD-10-CM | POA: Diagnosis not present

## 2020-02-23 DIAGNOSIS — E876 Hypokalemia: Secondary | ICD-10-CM | POA: Diagnosis not present

## 2020-02-23 DIAGNOSIS — F17290 Nicotine dependence, other tobacco product, uncomplicated: Secondary | ICD-10-CM | POA: Diagnosis not present

## 2020-02-23 DIAGNOSIS — G5602 Carpal tunnel syndrome, left upper limb: Secondary | ICD-10-CM | POA: Diagnosis not present

## 2020-02-23 DIAGNOSIS — C9110 Chronic lymphocytic leukemia of B-cell type not having achieved remission: Secondary | ICD-10-CM | POA: Diagnosis not present

## 2020-02-23 DIAGNOSIS — F988 Other specified behavioral and emotional disorders with onset usually occurring in childhood and adolescence: Secondary | ICD-10-CM | POA: Diagnosis not present

## 2020-02-23 DIAGNOSIS — Z299 Encounter for prophylactic measures, unspecified: Secondary | ICD-10-CM | POA: Diagnosis not present

## 2020-02-23 DIAGNOSIS — F0391 Unspecified dementia with behavioral disturbance: Secondary | ICD-10-CM | POA: Diagnosis not present

## 2020-02-23 DIAGNOSIS — K319 Disease of stomach and duodenum, unspecified: Secondary | ICD-10-CM | POA: Diagnosis not present

## 2020-02-23 DIAGNOSIS — C50919 Malignant neoplasm of unspecified site of unspecified female breast: Secondary | ICD-10-CM | POA: Diagnosis not present

## 2020-02-23 DIAGNOSIS — I110 Hypertensive heart disease with heart failure: Secondary | ICD-10-CM | POA: Diagnosis not present

## 2020-02-23 DIAGNOSIS — L97412 Non-pressure chronic ulcer of right heel and midfoot with fat layer exposed: Secondary | ICD-10-CM | POA: Diagnosis not present

## 2020-02-23 DIAGNOSIS — E610 Copper deficiency: Secondary | ICD-10-CM | POA: Diagnosis not present

## 2020-02-23 DIAGNOSIS — C50411 Malignant neoplasm of upper-outer quadrant of right female breast: Secondary | ICD-10-CM | POA: Diagnosis not present

## 2020-02-23 DIAGNOSIS — G4709 Other insomnia: Secondary | ICD-10-CM | POA: Diagnosis not present

## 2020-02-23 DIAGNOSIS — I5043 Acute on chronic combined systolic (congestive) and diastolic (congestive) heart failure: Secondary | ICD-10-CM | POA: Diagnosis not present

## 2020-02-23 DIAGNOSIS — G40909 Epilepsy, unspecified, not intractable, without status epilepticus: Secondary | ICD-10-CM | POA: Diagnosis not present

## 2020-02-23 DIAGNOSIS — R5383 Other fatigue: Secondary | ICD-10-CM | POA: Diagnosis not present

## 2020-02-23 DIAGNOSIS — Z125 Encounter for screening for malignant neoplasm of prostate: Secondary | ICD-10-CM | POA: Diagnosis not present

## 2020-02-23 DIAGNOSIS — B354 Tinea corporis: Secondary | ICD-10-CM | POA: Diagnosis not present

## 2020-02-23 DIAGNOSIS — F19980 Other psychoactive substance use, unspecified with psychoactive substance-induced anxiety disorder: Secondary | ICD-10-CM | POA: Diagnosis not present

## 2020-02-23 DIAGNOSIS — M25662 Stiffness of left knee, not elsewhere classified: Secondary | ICD-10-CM | POA: Diagnosis not present

## 2020-02-23 DIAGNOSIS — G7109 Other specified muscular dystrophies: Secondary | ICD-10-CM | POA: Diagnosis not present

## 2020-02-23 DIAGNOSIS — M79672 Pain in left foot: Secondary | ICD-10-CM | POA: Diagnosis not present

## 2020-02-23 DIAGNOSIS — R937 Abnormal findings on diagnostic imaging of other parts of musculoskeletal system: Secondary | ICD-10-CM | POA: Diagnosis not present

## 2020-02-23 DIAGNOSIS — S41112A Laceration without foreign body of left upper arm, initial encounter: Secondary | ICD-10-CM | POA: Diagnosis not present

## 2020-02-23 DIAGNOSIS — S90829A Blister (nonthermal), unspecified foot, initial encounter: Secondary | ICD-10-CM | POA: Diagnosis not present

## 2020-02-23 DIAGNOSIS — M2040 Other hammer toe(s) (acquired), unspecified foot: Secondary | ICD-10-CM | POA: Diagnosis not present

## 2020-02-23 DIAGNOSIS — W19XXXD Unspecified fall, subsequent encounter: Secondary | ICD-10-CM | POA: Diagnosis not present

## 2020-02-23 DIAGNOSIS — H353124 Nonexudative age-related macular degeneration, left eye, advanced atrophic with subfoveal involvement: Secondary | ICD-10-CM | POA: Diagnosis not present

## 2020-02-23 DIAGNOSIS — M530 Cervicocranial syndrome: Secondary | ICD-10-CM | POA: Diagnosis not present

## 2020-02-23 DIAGNOSIS — Z789 Other specified health status: Secondary | ICD-10-CM | POA: Diagnosis not present

## 2020-02-23 DIAGNOSIS — R238 Other skin changes: Secondary | ICD-10-CM | POA: Diagnosis not present

## 2020-02-23 DIAGNOSIS — Z4682 Encounter for fitting and adjustment of non-vascular catheter: Secondary | ICD-10-CM | POA: Diagnosis not present

## 2020-02-23 DIAGNOSIS — M24571 Contracture, right ankle: Secondary | ICD-10-CM | POA: Diagnosis not present

## 2020-02-23 DIAGNOSIS — H9312 Tinnitus, left ear: Secondary | ICD-10-CM | POA: Diagnosis not present

## 2020-02-23 DIAGNOSIS — M25542 Pain in joints of left hand: Secondary | ICD-10-CM | POA: Diagnosis not present

## 2020-02-23 DIAGNOSIS — C7B02 Secondary carcinoid tumors of liver: Secondary | ICD-10-CM | POA: Diagnosis not present

## 2020-02-23 DIAGNOSIS — I739 Peripheral vascular disease, unspecified: Secondary | ICD-10-CM | POA: Diagnosis not present

## 2020-02-23 DIAGNOSIS — D485 Neoplasm of uncertain behavior of skin: Secondary | ICD-10-CM | POA: Diagnosis not present

## 2020-02-23 DIAGNOSIS — Z880 Allergy status to penicillin: Secondary | ICD-10-CM | POA: Diagnosis not present

## 2020-02-23 DIAGNOSIS — H43813 Vitreous degeneration, bilateral: Secondary | ICD-10-CM | POA: Diagnosis not present

## 2020-02-23 DIAGNOSIS — Z01419 Encounter for gynecological examination (general) (routine) without abnormal findings: Secondary | ICD-10-CM | POA: Diagnosis not present

## 2020-02-23 DIAGNOSIS — M1712 Unilateral primary osteoarthritis, left knee: Secondary | ICD-10-CM | POA: Diagnosis not present

## 2020-02-23 DIAGNOSIS — Z6838 Body mass index (BMI) 38.0-38.9, adult: Secondary | ICD-10-CM | POA: Diagnosis not present

## 2020-02-23 DIAGNOSIS — S32592A Other specified fracture of left pubis, initial encounter for closed fracture: Secondary | ICD-10-CM | POA: Diagnosis not present

## 2020-02-23 DIAGNOSIS — I83029 Varicose veins of left lower extremity with ulcer of unspecified site: Secondary | ICD-10-CM | POA: Diagnosis not present

## 2020-02-23 DIAGNOSIS — N186 End stage renal disease: Secondary | ICD-10-CM | POA: Diagnosis not present

## 2020-02-23 DIAGNOSIS — M48061 Spinal stenosis, lumbar region without neurogenic claudication: Secondary | ICD-10-CM | POA: Diagnosis not present

## 2020-02-23 DIAGNOSIS — Z930 Tracheostomy status: Secondary | ICD-10-CM | POA: Diagnosis not present

## 2020-02-23 DIAGNOSIS — F3342 Major depressive disorder, recurrent, in full remission: Secondary | ICD-10-CM | POA: Diagnosis not present

## 2020-02-23 DIAGNOSIS — J0100 Acute maxillary sinusitis, unspecified: Secondary | ICD-10-CM | POA: Diagnosis not present

## 2020-02-23 DIAGNOSIS — Z6826 Body mass index (BMI) 26.0-26.9, adult: Secondary | ICD-10-CM | POA: Diagnosis not present

## 2020-02-23 DIAGNOSIS — Y998 Other external cause status: Secondary | ICD-10-CM | POA: Diagnosis not present

## 2020-02-23 DIAGNOSIS — L89614 Pressure ulcer of right heel, stage 4: Secondary | ICD-10-CM | POA: Diagnosis not present

## 2020-02-23 DIAGNOSIS — I639 Cerebral infarction, unspecified: Secondary | ICD-10-CM | POA: Diagnosis not present

## 2020-02-23 DIAGNOSIS — G510 Bell's palsy: Secondary | ICD-10-CM | POA: Diagnosis not present

## 2020-02-23 DIAGNOSIS — K644 Residual hemorrhoidal skin tags: Secondary | ICD-10-CM | POA: Diagnosis not present

## 2020-02-23 DIAGNOSIS — Z87448 Personal history of other diseases of urinary system: Secondary | ICD-10-CM | POA: Diagnosis not present

## 2020-02-23 DIAGNOSIS — T84498A Other mechanical complication of other internal orthopedic devices, implants and grafts, initial encounter: Secondary | ICD-10-CM | POA: Diagnosis not present

## 2020-02-23 DIAGNOSIS — E538 Deficiency of other specified B group vitamins: Secondary | ICD-10-CM | POA: Diagnosis not present

## 2020-02-23 DIAGNOSIS — M4302 Spondylolysis, cervical region: Secondary | ICD-10-CM | POA: Diagnosis not present

## 2020-02-23 DIAGNOSIS — R0781 Pleurodynia: Secondary | ICD-10-CM | POA: Diagnosis not present

## 2020-02-23 DIAGNOSIS — N39498 Other specified urinary incontinence: Secondary | ICD-10-CM | POA: Diagnosis not present

## 2020-02-23 DIAGNOSIS — L299 Pruritus, unspecified: Secondary | ICD-10-CM | POA: Diagnosis not present

## 2020-02-23 DIAGNOSIS — R351 Nocturia: Secondary | ICD-10-CM | POA: Diagnosis not present

## 2020-02-23 DIAGNOSIS — J849 Interstitial pulmonary disease, unspecified: Secondary | ICD-10-CM | POA: Diagnosis not present

## 2020-02-23 DIAGNOSIS — C3492 Malignant neoplasm of unspecified part of left bronchus or lung: Secondary | ICD-10-CM | POA: Diagnosis not present

## 2020-02-23 DIAGNOSIS — S73191A Other sprain of right hip, initial encounter: Secondary | ICD-10-CM | POA: Diagnosis not present

## 2020-02-23 DIAGNOSIS — E118 Type 2 diabetes mellitus with unspecified complications: Secondary | ICD-10-CM | POA: Diagnosis not present

## 2020-02-23 DIAGNOSIS — R591 Generalized enlarged lymph nodes: Secondary | ICD-10-CM | POA: Diagnosis not present

## 2020-02-23 DIAGNOSIS — F341 Dysthymic disorder: Secondary | ICD-10-CM | POA: Diagnosis not present

## 2020-02-23 DIAGNOSIS — M5127 Other intervertebral disc displacement, lumbosacral region: Secondary | ICD-10-CM | POA: Diagnosis not present

## 2020-02-23 DIAGNOSIS — Z96619 Presence of unspecified artificial shoulder joint: Secondary | ICD-10-CM | POA: Diagnosis not present

## 2020-02-23 DIAGNOSIS — M7631 Iliotibial band syndrome, right leg: Secondary | ICD-10-CM | POA: Diagnosis not present

## 2020-02-23 DIAGNOSIS — I69318 Other symptoms and signs involving cognitive functions following cerebral infarction: Secondary | ICD-10-CM | POA: Diagnosis not present

## 2020-02-23 DIAGNOSIS — Z8709 Personal history of other diseases of the respiratory system: Secondary | ICD-10-CM | POA: Diagnosis not present

## 2020-02-23 DIAGNOSIS — L0292 Furuncle, unspecified: Secondary | ICD-10-CM | POA: Diagnosis not present

## 2020-02-23 DIAGNOSIS — N200 Calculus of kidney: Secondary | ICD-10-CM | POA: Diagnosis not present

## 2020-02-23 DIAGNOSIS — R9389 Abnormal findings on diagnostic imaging of other specified body structures: Secondary | ICD-10-CM | POA: Diagnosis not present

## 2020-02-23 DIAGNOSIS — Z8781 Personal history of (healed) traumatic fracture: Secondary | ICD-10-CM | POA: Diagnosis not present

## 2020-02-23 DIAGNOSIS — Z209 Contact with and (suspected) exposure to unspecified communicable disease: Secondary | ICD-10-CM | POA: Diagnosis not present

## 2020-02-23 DIAGNOSIS — J40 Bronchitis, not specified as acute or chronic: Secondary | ICD-10-CM | POA: Diagnosis not present

## 2020-02-23 DIAGNOSIS — R197 Diarrhea, unspecified: Secondary | ICD-10-CM | POA: Diagnosis not present

## 2020-02-23 DIAGNOSIS — R262 Difficulty in walking, not elsewhere classified: Secondary | ICD-10-CM | POA: Diagnosis not present

## 2020-02-23 DIAGNOSIS — I061 Rheumatic aortic insufficiency: Secondary | ICD-10-CM | POA: Diagnosis not present

## 2020-02-23 DIAGNOSIS — Z9114 Patient's other noncompliance with medication regimen: Secondary | ICD-10-CM | POA: Diagnosis not present

## 2020-02-23 DIAGNOSIS — E11628 Type 2 diabetes mellitus with other skin complications: Secondary | ICD-10-CM | POA: Diagnosis not present

## 2020-02-23 DIAGNOSIS — F1020 Alcohol dependence, uncomplicated: Secondary | ICD-10-CM | POA: Diagnosis not present

## 2020-02-23 DIAGNOSIS — C8590 Non-Hodgkin lymphoma, unspecified, unspecified site: Secondary | ICD-10-CM | POA: Diagnosis not present

## 2020-02-23 DIAGNOSIS — M2011 Hallux valgus (acquired), right foot: Secondary | ICD-10-CM | POA: Diagnosis not present

## 2020-02-23 DIAGNOSIS — R55 Syncope and collapse: Secondary | ICD-10-CM | POA: Diagnosis not present

## 2020-02-23 DIAGNOSIS — M6281 Muscle weakness (generalized): Secondary | ICD-10-CM | POA: Diagnosis not present

## 2020-02-23 DIAGNOSIS — Z122 Encounter for screening for malignant neoplasm of respiratory organs: Secondary | ICD-10-CM | POA: Diagnosis not present

## 2020-02-23 DIAGNOSIS — Z86018 Personal history of other benign neoplasm: Secondary | ICD-10-CM | POA: Diagnosis not present

## 2020-02-23 DIAGNOSIS — R801 Persistent proteinuria, unspecified: Secondary | ICD-10-CM | POA: Diagnosis not present

## 2020-02-23 DIAGNOSIS — L603 Nail dystrophy: Secondary | ICD-10-CM | POA: Diagnosis not present

## 2020-02-23 DIAGNOSIS — I69390 Apraxia following cerebral infarction: Secondary | ICD-10-CM | POA: Diagnosis not present

## 2020-02-23 DIAGNOSIS — M4800 Spinal stenosis, site unspecified: Secondary | ICD-10-CM | POA: Diagnosis not present

## 2020-02-23 DIAGNOSIS — Z8701 Personal history of pneumonia (recurrent): Secondary | ICD-10-CM | POA: Diagnosis not present

## 2020-02-23 DIAGNOSIS — Z905 Acquired absence of kidney: Secondary | ICD-10-CM | POA: Diagnosis not present

## 2020-02-23 DIAGNOSIS — C44319 Basal cell carcinoma of skin of other parts of face: Secondary | ICD-10-CM | POA: Diagnosis not present

## 2020-02-23 DIAGNOSIS — H40053 Ocular hypertension, bilateral: Secondary | ICD-10-CM | POA: Diagnosis not present

## 2020-02-23 DIAGNOSIS — I83019 Varicose veins of right lower extremity with ulcer of unspecified site: Secondary | ICD-10-CM | POA: Diagnosis not present

## 2020-02-23 DIAGNOSIS — F39 Unspecified mood [affective] disorder: Secondary | ICD-10-CM | POA: Diagnosis not present

## 2020-02-23 DIAGNOSIS — M0589 Other rheumatoid arthritis with rheumatoid factor of multiple sites: Secondary | ICD-10-CM | POA: Diagnosis not present

## 2020-02-23 DIAGNOSIS — H25811 Combined forms of age-related cataract, right eye: Secondary | ICD-10-CM | POA: Diagnosis not present

## 2020-02-23 DIAGNOSIS — M19011 Primary osteoarthritis, right shoulder: Secondary | ICD-10-CM | POA: Diagnosis not present

## 2020-02-23 DIAGNOSIS — E28310 Symptomatic premature menopause: Secondary | ICD-10-CM | POA: Diagnosis not present

## 2020-02-23 DIAGNOSIS — R778 Other specified abnormalities of plasma proteins: Secondary | ICD-10-CM | POA: Diagnosis not present

## 2020-02-23 DIAGNOSIS — M546 Pain in thoracic spine: Secondary | ICD-10-CM | POA: Diagnosis not present

## 2020-02-23 DIAGNOSIS — E211 Secondary hyperparathyroidism, not elsewhere classified: Secondary | ICD-10-CM | POA: Diagnosis not present

## 2020-02-23 DIAGNOSIS — G459 Transient cerebral ischemic attack, unspecified: Secondary | ICD-10-CM | POA: Diagnosis not present

## 2020-02-23 DIAGNOSIS — Z87891 Personal history of nicotine dependence: Secondary | ICD-10-CM | POA: Diagnosis not present

## 2020-02-23 DIAGNOSIS — G7001 Myasthenia gravis with (acute) exacerbation: Secondary | ICD-10-CM | POA: Diagnosis not present

## 2020-02-23 DIAGNOSIS — M7542 Impingement syndrome of left shoulder: Secondary | ICD-10-CM | POA: Diagnosis not present

## 2020-02-23 DIAGNOSIS — M25461 Effusion, right knee: Secondary | ICD-10-CM | POA: Diagnosis not present

## 2020-02-23 DIAGNOSIS — R079 Chest pain, unspecified: Secondary | ICD-10-CM | POA: Diagnosis not present

## 2020-02-23 DIAGNOSIS — M47816 Spondylosis without myelopathy or radiculopathy, lumbar region: Secondary | ICD-10-CM | POA: Diagnosis not present

## 2020-02-23 DIAGNOSIS — M0500 Felty's syndrome, unspecified site: Secondary | ICD-10-CM | POA: Diagnosis not present

## 2020-02-23 DIAGNOSIS — G894 Chronic pain syndrome: Secondary | ICD-10-CM | POA: Diagnosis not present

## 2020-02-23 DIAGNOSIS — T83098A Other mechanical complication of other indwelling urethral catheter, initial encounter: Secondary | ICD-10-CM | POA: Diagnosis not present

## 2020-02-23 DIAGNOSIS — M109 Gout, unspecified: Secondary | ICD-10-CM | POA: Diagnosis not present

## 2020-02-23 DIAGNOSIS — Z6823 Body mass index (BMI) 23.0-23.9, adult: Secondary | ICD-10-CM | POA: Diagnosis not present

## 2020-02-23 DIAGNOSIS — Z6832 Body mass index (BMI) 32.0-32.9, adult: Secondary | ICD-10-CM | POA: Diagnosis not present

## 2020-02-23 DIAGNOSIS — M4316 Spondylolisthesis, lumbar region: Secondary | ICD-10-CM | POA: Diagnosis not present

## 2020-02-23 DIAGNOSIS — F419 Anxiety disorder, unspecified: Secondary | ICD-10-CM | POA: Diagnosis not present

## 2020-02-23 DIAGNOSIS — R4701 Aphasia: Secondary | ICD-10-CM | POA: Diagnosis not present

## 2020-02-23 DIAGNOSIS — H903 Sensorineural hearing loss, bilateral: Secondary | ICD-10-CM | POA: Diagnosis not present

## 2020-02-23 DIAGNOSIS — Z471 Aftercare following joint replacement surgery: Secondary | ICD-10-CM | POA: Diagnosis not present

## 2020-02-23 DIAGNOSIS — D120 Benign neoplasm of cecum: Secondary | ICD-10-CM | POA: Diagnosis not present

## 2020-02-23 DIAGNOSIS — I4891 Unspecified atrial fibrillation: Secondary | ICD-10-CM | POA: Diagnosis not present

## 2020-02-23 DIAGNOSIS — M25862 Other specified joint disorders, left knee: Secondary | ICD-10-CM | POA: Diagnosis not present

## 2020-02-23 DIAGNOSIS — R1312 Dysphagia, oropharyngeal phase: Secondary | ICD-10-CM | POA: Diagnosis not present

## 2020-02-23 DIAGNOSIS — E1140 Type 2 diabetes mellitus with diabetic neuropathy, unspecified: Secondary | ICD-10-CM | POA: Diagnosis not present

## 2020-02-23 DIAGNOSIS — D509 Iron deficiency anemia, unspecified: Secondary | ICD-10-CM | POA: Diagnosis not present

## 2020-02-23 DIAGNOSIS — Z91128 Patient's intentional underdosing of medication regimen for other reason: Secondary | ICD-10-CM | POA: Diagnosis not present

## 2020-02-23 DIAGNOSIS — M25861 Other specified joint disorders, right knee: Secondary | ICD-10-CM | POA: Diagnosis not present

## 2020-02-23 DIAGNOSIS — B0052 Herpesviral keratitis: Secondary | ICD-10-CM | POA: Diagnosis not present

## 2020-02-23 DIAGNOSIS — D519 Vitamin B12 deficiency anemia, unspecified: Secondary | ICD-10-CM | POA: Diagnosis not present

## 2020-02-23 DIAGNOSIS — I5020 Unspecified systolic (congestive) heart failure: Secondary | ICD-10-CM | POA: Diagnosis not present

## 2020-02-23 DIAGNOSIS — R252 Cramp and spasm: Secondary | ICD-10-CM | POA: Diagnosis not present

## 2020-02-23 DIAGNOSIS — I25118 Atherosclerotic heart disease of native coronary artery with other forms of angina pectoris: Secondary | ICD-10-CM | POA: Diagnosis not present

## 2020-02-23 DIAGNOSIS — Z85038 Personal history of other malignant neoplasm of large intestine: Secondary | ICD-10-CM | POA: Diagnosis not present

## 2020-02-23 DIAGNOSIS — I69320 Aphasia following cerebral infarction: Secondary | ICD-10-CM | POA: Diagnosis not present

## 2020-02-23 DIAGNOSIS — E1039 Type 1 diabetes mellitus with other diabetic ophthalmic complication: Secondary | ICD-10-CM | POA: Diagnosis not present

## 2020-02-23 DIAGNOSIS — Z23 Encounter for immunization: Secondary | ICD-10-CM | POA: Diagnosis not present

## 2020-02-23 DIAGNOSIS — F1010 Alcohol abuse, uncomplicated: Secondary | ICD-10-CM | POA: Diagnosis not present

## 2020-02-23 DIAGNOSIS — F0150 Vascular dementia without behavioral disturbance: Secondary | ICD-10-CM | POA: Diagnosis not present

## 2020-02-23 DIAGNOSIS — B079 Viral wart, unspecified: Secondary | ICD-10-CM | POA: Diagnosis not present

## 2020-02-23 DIAGNOSIS — L509 Urticaria, unspecified: Secondary | ICD-10-CM | POA: Diagnosis not present

## 2020-02-23 DIAGNOSIS — M792 Neuralgia and neuritis, unspecified: Secondary | ICD-10-CM | POA: Diagnosis not present

## 2020-02-23 DIAGNOSIS — L97919 Non-pressure chronic ulcer of unspecified part of right lower leg with unspecified severity: Secondary | ICD-10-CM | POA: Diagnosis not present

## 2020-02-23 DIAGNOSIS — D801 Nonfamilial hypogammaglobulinemia: Secondary | ICD-10-CM | POA: Diagnosis not present

## 2020-02-23 DIAGNOSIS — Z9081 Acquired absence of spleen: Secondary | ICD-10-CM | POA: Diagnosis not present

## 2020-02-23 DIAGNOSIS — T8489XA Other specified complication of internal orthopedic prosthetic devices, implants and grafts, initial encounter: Secondary | ICD-10-CM | POA: Diagnosis not present

## 2020-02-23 DIAGNOSIS — D045 Carcinoma in situ of skin of trunk: Secondary | ICD-10-CM | POA: Diagnosis not present

## 2020-02-23 DIAGNOSIS — R21 Rash and other nonspecific skin eruption: Secondary | ICD-10-CM | POA: Diagnosis not present

## 2020-02-23 DIAGNOSIS — Z89421 Acquired absence of other right toe(s): Secondary | ICD-10-CM | POA: Diagnosis not present

## 2020-02-23 DIAGNOSIS — D7589 Other specified diseases of blood and blood-forming organs: Secondary | ICD-10-CM | POA: Diagnosis not present

## 2020-02-23 DIAGNOSIS — H34232 Retinal artery branch occlusion, left eye: Secondary | ICD-10-CM | POA: Diagnosis not present

## 2020-02-23 DIAGNOSIS — I50812 Chronic right heart failure: Secondary | ICD-10-CM | POA: Diagnosis not present

## 2020-02-23 DIAGNOSIS — Z86711 Personal history of pulmonary embolism: Secondary | ICD-10-CM | POA: Diagnosis not present

## 2020-02-23 DIAGNOSIS — L409 Psoriasis, unspecified: Secondary | ICD-10-CM | POA: Diagnosis not present

## 2020-02-23 DIAGNOSIS — Z79891 Long term (current) use of opiate analgesic: Secondary | ICD-10-CM | POA: Diagnosis not present

## 2020-02-23 DIAGNOSIS — M4186 Other forms of scoliosis, lumbar region: Secondary | ICD-10-CM | POA: Diagnosis not present

## 2020-02-23 DIAGNOSIS — I200 Unstable angina: Secondary | ICD-10-CM | POA: Diagnosis not present

## 2020-02-23 DIAGNOSIS — R001 Bradycardia, unspecified: Secondary | ICD-10-CM | POA: Diagnosis not present

## 2020-02-23 DIAGNOSIS — H524 Presbyopia: Secondary | ICD-10-CM | POA: Diagnosis not present

## 2020-02-23 DIAGNOSIS — R0602 Shortness of breath: Secondary | ICD-10-CM | POA: Diagnosis not present

## 2020-02-23 DIAGNOSIS — E1151 Type 2 diabetes mellitus with diabetic peripheral angiopathy without gangrene: Secondary | ICD-10-CM | POA: Diagnosis not present

## 2020-02-23 DIAGNOSIS — R0600 Dyspnea, unspecified: Secondary | ICD-10-CM | POA: Diagnosis not present

## 2020-02-23 DIAGNOSIS — R1033 Periumbilical pain: Secondary | ICD-10-CM | POA: Diagnosis not present

## 2020-02-23 DIAGNOSIS — R309 Painful micturition, unspecified: Secondary | ICD-10-CM | POA: Diagnosis not present

## 2020-02-23 DIAGNOSIS — F418 Other specified anxiety disorders: Secondary | ICD-10-CM | POA: Diagnosis not present

## 2020-02-23 DIAGNOSIS — R52 Pain, unspecified: Secondary | ICD-10-CM | POA: Diagnosis not present

## 2020-02-23 DIAGNOSIS — F17200 Nicotine dependence, unspecified, uncomplicated: Secondary | ICD-10-CM | POA: Diagnosis not present

## 2020-02-23 DIAGNOSIS — M154 Erosive (osteo)arthritis: Secondary | ICD-10-CM | POA: Diagnosis not present

## 2020-02-23 DIAGNOSIS — I472 Ventricular tachycardia: Secondary | ICD-10-CM | POA: Diagnosis not present

## 2020-02-23 DIAGNOSIS — M25511 Pain in right shoulder: Secondary | ICD-10-CM | POA: Diagnosis not present

## 2020-02-23 DIAGNOSIS — K5790 Diverticulosis of intestine, part unspecified, without perforation or abscess without bleeding: Secondary | ICD-10-CM | POA: Diagnosis not present

## 2020-02-23 DIAGNOSIS — H31001 Unspecified chorioretinal scars, right eye: Secondary | ICD-10-CM | POA: Diagnosis not present

## 2020-02-23 DIAGNOSIS — L820 Inflamed seborrheic keratosis: Secondary | ICD-10-CM | POA: Diagnosis not present

## 2020-02-23 DIAGNOSIS — C439 Malignant melanoma of skin, unspecified: Secondary | ICD-10-CM | POA: Diagnosis not present

## 2020-02-23 DIAGNOSIS — M961 Postlaminectomy syndrome, not elsewhere classified: Secondary | ICD-10-CM | POA: Diagnosis not present

## 2020-02-23 DIAGNOSIS — L89152 Pressure ulcer of sacral region, stage 2: Secondary | ICD-10-CM | POA: Diagnosis not present

## 2020-02-23 DIAGNOSIS — I82409 Acute embolism and thrombosis of unspecified deep veins of unspecified lower extremity: Secondary | ICD-10-CM | POA: Diagnosis not present

## 2020-02-23 DIAGNOSIS — M1990 Unspecified osteoarthritis, unspecified site: Secondary | ICD-10-CM | POA: Diagnosis not present

## 2020-02-23 DIAGNOSIS — W57XXXA Bitten or stung by nonvenomous insect and other nonvenomous arthropods, initial encounter: Secondary | ICD-10-CM | POA: Diagnosis not present

## 2020-02-23 DIAGNOSIS — R3121 Asymptomatic microscopic hematuria: Secondary | ICD-10-CM | POA: Diagnosis not present

## 2020-02-23 DIAGNOSIS — C50911 Malignant neoplasm of unspecified site of right female breast: Secondary | ICD-10-CM | POA: Diagnosis not present

## 2020-02-23 DIAGNOSIS — Z8546 Personal history of malignant neoplasm of prostate: Secondary | ICD-10-CM | POA: Diagnosis not present

## 2020-02-23 DIAGNOSIS — R231 Pallor: Secondary | ICD-10-CM | POA: Diagnosis not present

## 2020-02-23 DIAGNOSIS — J4550 Severe persistent asthma, uncomplicated: Secondary | ICD-10-CM | POA: Diagnosis not present

## 2020-02-23 DIAGNOSIS — I864 Gastric varices: Secondary | ICD-10-CM | POA: Diagnosis not present

## 2020-02-23 DIAGNOSIS — D631 Anemia in chronic kidney disease: Secondary | ICD-10-CM | POA: Diagnosis not present

## 2020-02-23 DIAGNOSIS — Z6825 Body mass index (BMI) 25.0-25.9, adult: Secondary | ICD-10-CM | POA: Diagnosis not present

## 2020-02-23 DIAGNOSIS — R2689 Other abnormalities of gait and mobility: Secondary | ICD-10-CM | POA: Diagnosis not present

## 2020-02-23 DIAGNOSIS — R011 Cardiac murmur, unspecified: Secondary | ICD-10-CM | POA: Diagnosis not present

## 2020-02-23 DIAGNOSIS — M5442 Lumbago with sciatica, left side: Secondary | ICD-10-CM | POA: Diagnosis not present

## 2020-02-23 DIAGNOSIS — Z139 Encounter for screening, unspecified: Secondary | ICD-10-CM | POA: Diagnosis not present

## 2020-02-23 DIAGNOSIS — Z951 Presence of aortocoronary bypass graft: Secondary | ICD-10-CM | POA: Diagnosis not present

## 2020-02-23 DIAGNOSIS — H401121 Primary open-angle glaucoma, left eye, mild stage: Secondary | ICD-10-CM | POA: Diagnosis not present

## 2020-02-23 DIAGNOSIS — F1320 Sedative, hypnotic or anxiolytic dependence, uncomplicated: Secondary | ICD-10-CM | POA: Diagnosis not present

## 2020-02-23 DIAGNOSIS — F23 Brief psychotic disorder: Secondary | ICD-10-CM | POA: Diagnosis not present

## 2020-02-23 DIAGNOSIS — I252 Old myocardial infarction: Secondary | ICD-10-CM | POA: Diagnosis not present

## 2020-02-23 DIAGNOSIS — G8921 Chronic pain due to trauma: Secondary | ICD-10-CM | POA: Diagnosis not present

## 2020-02-23 DIAGNOSIS — Z90411 Acquired partial absence of pancreas: Secondary | ICD-10-CM | POA: Diagnosis not present

## 2020-02-23 DIAGNOSIS — R269 Unspecified abnormalities of gait and mobility: Secondary | ICD-10-CM | POA: Diagnosis not present

## 2020-02-23 DIAGNOSIS — Z6828 Body mass index (BMI) 28.0-28.9, adult: Secondary | ICD-10-CM | POA: Diagnosis not present

## 2020-02-23 DIAGNOSIS — I480 Paroxysmal atrial fibrillation: Secondary | ICD-10-CM | POA: Diagnosis not present

## 2020-02-23 DIAGNOSIS — R270 Ataxia, unspecified: Secondary | ICD-10-CM | POA: Diagnosis not present

## 2020-02-23 DIAGNOSIS — A499 Bacterial infection, unspecified: Secondary | ICD-10-CM | POA: Diagnosis not present

## 2020-02-23 DIAGNOSIS — L578 Other skin changes due to chronic exposure to nonionizing radiation: Secondary | ICD-10-CM | POA: Diagnosis not present

## 2020-02-23 DIAGNOSIS — M4726 Other spondylosis with radiculopathy, lumbar region: Secondary | ICD-10-CM | POA: Diagnosis not present

## 2020-02-23 DIAGNOSIS — C50311 Malignant neoplasm of lower-inner quadrant of right female breast: Secondary | ICD-10-CM | POA: Diagnosis not present

## 2020-02-23 DIAGNOSIS — M25612 Stiffness of left shoulder, not elsewhere classified: Secondary | ICD-10-CM | POA: Diagnosis not present

## 2020-02-23 DIAGNOSIS — S30861A Insect bite (nonvenomous) of abdominal wall, initial encounter: Secondary | ICD-10-CM | POA: Diagnosis not present

## 2020-02-23 DIAGNOSIS — Z8669 Personal history of other diseases of the nervous system and sense organs: Secondary | ICD-10-CM | POA: Diagnosis not present

## 2020-02-23 DIAGNOSIS — R4020 Unspecified coma: Secondary | ICD-10-CM | POA: Diagnosis not present

## 2020-02-23 DIAGNOSIS — Z09 Encounter for follow-up examination after completed treatment for conditions other than malignant neoplasm: Secondary | ICD-10-CM | POA: Diagnosis not present

## 2020-02-23 DIAGNOSIS — E441 Mild protein-calorie malnutrition: Secondary | ICD-10-CM | POA: Diagnosis not present

## 2020-02-23 DIAGNOSIS — M0630 Rheumatoid nodule, unspecified site: Secondary | ICD-10-CM | POA: Diagnosis not present

## 2020-02-23 DIAGNOSIS — B070 Plantar wart: Secondary | ICD-10-CM | POA: Diagnosis not present

## 2020-02-23 DIAGNOSIS — H401122 Primary open-angle glaucoma, left eye, moderate stage: Secondary | ICD-10-CM | POA: Diagnosis not present

## 2020-02-23 DIAGNOSIS — Z95810 Presence of automatic (implantable) cardiac defibrillator: Secondary | ICD-10-CM | POA: Diagnosis not present

## 2020-02-23 DIAGNOSIS — R296 Repeated falls: Secondary | ICD-10-CM | POA: Diagnosis not present

## 2020-02-23 DIAGNOSIS — I471 Supraventricular tachycardia: Secondary | ICD-10-CM | POA: Diagnosis not present

## 2020-02-23 DIAGNOSIS — Z96612 Presence of left artificial shoulder joint: Secondary | ICD-10-CM | POA: Diagnosis not present

## 2020-02-23 DIAGNOSIS — K921 Melena: Secondary | ICD-10-CM | POA: Diagnosis not present

## 2020-02-23 DIAGNOSIS — H53021 Refractive amblyopia, right eye: Secondary | ICD-10-CM | POA: Diagnosis not present

## 2020-02-23 DIAGNOSIS — R7301 Impaired fasting glucose: Secondary | ICD-10-CM | POA: Diagnosis not present

## 2020-02-23 DIAGNOSIS — N393 Stress incontinence (female) (male): Secondary | ICD-10-CM | POA: Diagnosis not present

## 2020-02-23 DIAGNOSIS — H04123 Dry eye syndrome of bilateral lacrimal glands: Secondary | ICD-10-CM | POA: Diagnosis not present

## 2020-02-23 DIAGNOSIS — M10472 Other secondary gout, left ankle and foot: Secondary | ICD-10-CM | POA: Diagnosis not present

## 2020-02-23 DIAGNOSIS — F1910 Other psychoactive substance abuse, uncomplicated: Secondary | ICD-10-CM | POA: Diagnosis not present

## 2020-02-23 DIAGNOSIS — F4321 Adjustment disorder with depressed mood: Secondary | ICD-10-CM | POA: Diagnosis not present

## 2020-02-23 DIAGNOSIS — M86671 Other chronic osteomyelitis, right ankle and foot: Secondary | ICD-10-CM | POA: Diagnosis not present

## 2020-02-23 DIAGNOSIS — I5023 Acute on chronic systolic (congestive) heart failure: Secondary | ICD-10-CM | POA: Diagnosis not present

## 2020-02-23 DIAGNOSIS — C669 Malignant neoplasm of unspecified ureter: Secondary | ICD-10-CM | POA: Diagnosis not present

## 2020-02-23 DIAGNOSIS — M79676 Pain in unspecified toe(s): Secondary | ICD-10-CM | POA: Diagnosis not present

## 2020-02-23 DIAGNOSIS — D170 Benign lipomatous neoplasm of skin and subcutaneous tissue of head, face and neck: Secondary | ICD-10-CM | POA: Diagnosis not present

## 2020-02-23 DIAGNOSIS — M67911 Unspecified disorder of synovium and tendon, right shoulder: Secondary | ICD-10-CM | POA: Diagnosis not present

## 2020-02-23 DIAGNOSIS — Z87442 Personal history of urinary calculi: Secondary | ICD-10-CM | POA: Diagnosis not present

## 2020-02-23 DIAGNOSIS — D692 Other nonthrombocytopenic purpura: Secondary | ICD-10-CM | POA: Diagnosis not present

## 2020-02-23 DIAGNOSIS — E782 Mixed hyperlipidemia: Secondary | ICD-10-CM | POA: Diagnosis not present

## 2020-02-23 DIAGNOSIS — Z6837 Body mass index (BMI) 37.0-37.9, adult: Secondary | ICD-10-CM | POA: Diagnosis not present

## 2020-02-23 DIAGNOSIS — I081 Rheumatic disorders of both mitral and tricuspid valves: Secondary | ICD-10-CM | POA: Diagnosis not present

## 2020-02-23 DIAGNOSIS — K581 Irritable bowel syndrome with constipation: Secondary | ICD-10-CM | POA: Diagnosis not present

## 2020-02-23 DIAGNOSIS — I83893 Varicose veins of bilateral lower extremities with other complications: Secondary | ICD-10-CM | POA: Diagnosis not present

## 2020-02-23 DIAGNOSIS — Z981 Arthrodesis status: Secondary | ICD-10-CM | POA: Diagnosis not present

## 2020-02-23 DIAGNOSIS — E7800 Pure hypercholesterolemia, unspecified: Secondary | ICD-10-CM | POA: Diagnosis not present

## 2020-02-23 DIAGNOSIS — H43393 Other vitreous opacities, bilateral: Secondary | ICD-10-CM | POA: Diagnosis not present

## 2020-02-23 DIAGNOSIS — D6869 Other thrombophilia: Secondary | ICD-10-CM | POA: Diagnosis not present

## 2020-02-23 DIAGNOSIS — R9431 Abnormal electrocardiogram [ECG] [EKG]: Secondary | ICD-10-CM | POA: Diagnosis not present

## 2020-02-23 DIAGNOSIS — I129 Hypertensive chronic kidney disease with stage 1 through stage 4 chronic kidney disease, or unspecified chronic kidney disease: Secondary | ICD-10-CM | POA: Diagnosis not present

## 2020-02-23 DIAGNOSIS — Z91018 Allergy to other foods: Secondary | ICD-10-CM | POA: Diagnosis not present

## 2020-02-23 DIAGNOSIS — S42291A Other displaced fracture of upper end of right humerus, initial encounter for closed fracture: Secondary | ICD-10-CM | POA: Diagnosis not present

## 2020-02-23 DIAGNOSIS — R945 Abnormal results of liver function studies: Secondary | ICD-10-CM | POA: Diagnosis not present

## 2020-02-23 DIAGNOSIS — H35051 Retinal neovascularization, unspecified, right eye: Secondary | ICD-10-CM | POA: Diagnosis not present

## 2020-02-23 DIAGNOSIS — D176 Benign lipomatous neoplasm of spermatic cord: Secondary | ICD-10-CM | POA: Diagnosis not present

## 2020-02-23 DIAGNOSIS — R7303 Prediabetes: Secondary | ICD-10-CM | POA: Diagnosis not present

## 2020-02-23 DIAGNOSIS — F314 Bipolar disorder, current episode depressed, severe, without psychotic features: Secondary | ICD-10-CM | POA: Diagnosis not present

## 2020-02-23 DIAGNOSIS — Z96651 Presence of right artificial knee joint: Secondary | ICD-10-CM | POA: Diagnosis not present

## 2020-02-23 DIAGNOSIS — S78111D Complete traumatic amputation at level between right hip and knee, subsequent encounter: Secondary | ICD-10-CM | POA: Diagnosis not present

## 2020-02-23 DIAGNOSIS — R5382 Chronic fatigue, unspecified: Secondary | ICD-10-CM | POA: Diagnosis not present

## 2020-02-23 DIAGNOSIS — M8580 Other specified disorders of bone density and structure, unspecified site: Secondary | ICD-10-CM | POA: Diagnosis not present

## 2020-02-23 DIAGNOSIS — N8110 Cystocele, unspecified: Secondary | ICD-10-CM | POA: Diagnosis not present

## 2020-02-23 DIAGNOSIS — J439 Emphysema, unspecified: Secondary | ICD-10-CM | POA: Diagnosis not present

## 2020-02-23 DIAGNOSIS — K317 Polyp of stomach and duodenum: Secondary | ICD-10-CM | POA: Diagnosis not present

## 2020-02-23 DIAGNOSIS — H43391 Other vitreous opacities, right eye: Secondary | ICD-10-CM | POA: Diagnosis not present

## 2020-02-23 DIAGNOSIS — I420 Dilated cardiomyopathy: Secondary | ICD-10-CM | POA: Diagnosis not present

## 2020-02-23 DIAGNOSIS — E11311 Type 2 diabetes mellitus with unspecified diabetic retinopathy with macular edema: Secondary | ICD-10-CM | POA: Diagnosis not present

## 2020-02-23 DIAGNOSIS — I5033 Acute on chronic diastolic (congestive) heart failure: Secondary | ICD-10-CM | POA: Diagnosis not present

## 2020-02-23 DIAGNOSIS — I10 Essential (primary) hypertension: Secondary | ICD-10-CM | POA: Diagnosis not present

## 2020-02-23 DIAGNOSIS — I5032 Chronic diastolic (congestive) heart failure: Secondary | ICD-10-CM | POA: Diagnosis not present

## 2020-02-23 DIAGNOSIS — N189 Chronic kidney disease, unspecified: Secondary | ICD-10-CM | POA: Diagnosis not present

## 2020-02-23 DIAGNOSIS — M1710 Unilateral primary osteoarthritis, unspecified knee: Secondary | ICD-10-CM | POA: Diagnosis not present

## 2020-02-23 DIAGNOSIS — F1994 Other psychoactive substance use, unspecified with psychoactive substance-induced mood disorder: Secondary | ICD-10-CM | POA: Diagnosis not present

## 2020-02-23 DIAGNOSIS — M179 Osteoarthritis of knee, unspecified: Secondary | ICD-10-CM | POA: Diagnosis not present

## 2020-02-23 DIAGNOSIS — Z7409 Other reduced mobility: Secondary | ICD-10-CM | POA: Diagnosis not present

## 2020-02-23 DIAGNOSIS — J45998 Other asthma: Secondary | ICD-10-CM | POA: Diagnosis not present

## 2020-02-23 DIAGNOSIS — J4540 Moderate persistent asthma, uncomplicated: Secondary | ICD-10-CM | POA: Diagnosis not present

## 2020-02-23 DIAGNOSIS — N3010 Interstitial cystitis (chronic) without hematuria: Secondary | ICD-10-CM | POA: Diagnosis not present

## 2020-02-23 DIAGNOSIS — M329 Systemic lupus erythematosus, unspecified: Secondary | ICD-10-CM | POA: Diagnosis not present

## 2020-02-23 DIAGNOSIS — K5901 Slow transit constipation: Secondary | ICD-10-CM | POA: Diagnosis not present

## 2020-02-23 DIAGNOSIS — K2900 Acute gastritis without bleeding: Secondary | ICD-10-CM | POA: Diagnosis not present

## 2020-02-23 DIAGNOSIS — H60312 Diffuse otitis externa, left ear: Secondary | ICD-10-CM | POA: Diagnosis not present

## 2020-02-23 DIAGNOSIS — F5101 Primary insomnia: Secondary | ICD-10-CM | POA: Diagnosis not present

## 2020-02-23 DIAGNOSIS — F5104 Psychophysiologic insomnia: Secondary | ICD-10-CM | POA: Diagnosis not present

## 2020-02-23 DIAGNOSIS — I517 Cardiomegaly: Secondary | ICD-10-CM | POA: Diagnosis not present

## 2020-02-23 DIAGNOSIS — E349 Endocrine disorder, unspecified: Secondary | ICD-10-CM | POA: Diagnosis not present

## 2020-02-23 DIAGNOSIS — F17211 Nicotine dependence, cigarettes, in remission: Secondary | ICD-10-CM | POA: Diagnosis not present

## 2020-02-23 DIAGNOSIS — M549 Dorsalgia, unspecified: Secondary | ICD-10-CM | POA: Diagnosis not present

## 2020-02-23 DIAGNOSIS — M2560 Stiffness of unspecified joint, not elsewhere classified: Secondary | ICD-10-CM | POA: Diagnosis not present

## 2020-02-23 DIAGNOSIS — C9310 Chronic myelomonocytic leukemia not having achieved remission: Secondary | ICD-10-CM | POA: Diagnosis not present

## 2020-02-23 DIAGNOSIS — M810 Age-related osteoporosis without current pathological fracture: Secondary | ICD-10-CM | POA: Diagnosis not present

## 2020-02-23 DIAGNOSIS — Z95828 Presence of other vascular implants and grafts: Secondary | ICD-10-CM | POA: Diagnosis not present

## 2020-02-23 DIAGNOSIS — L2089 Other atopic dermatitis: Secondary | ICD-10-CM | POA: Diagnosis not present

## 2020-02-23 DIAGNOSIS — I444 Left anterior fascicular block: Secondary | ICD-10-CM | POA: Diagnosis not present

## 2020-02-23 DIAGNOSIS — L600 Ingrowing nail: Secondary | ICD-10-CM | POA: Diagnosis not present

## 2020-02-23 DIAGNOSIS — J441 Chronic obstructive pulmonary disease with (acute) exacerbation: Secondary | ICD-10-CM | POA: Diagnosis not present

## 2020-02-23 DIAGNOSIS — N182 Chronic kidney disease, stage 2 (mild): Secondary | ICD-10-CM | POA: Diagnosis not present

## 2020-02-23 DIAGNOSIS — M7989 Other specified soft tissue disorders: Secondary | ICD-10-CM | POA: Diagnosis not present

## 2020-02-23 DIAGNOSIS — Z9109 Other allergy status, other than to drugs and biological substances: Secondary | ICD-10-CM | POA: Diagnosis not present

## 2020-02-23 DIAGNOSIS — J440 Chronic obstructive pulmonary disease with acute lower respiratory infection: Secondary | ICD-10-CM | POA: Diagnosis not present

## 2020-02-23 DIAGNOSIS — Z712 Person consulting for explanation of examination or test findings: Secondary | ICD-10-CM | POA: Diagnosis not present

## 2020-02-23 DIAGNOSIS — M205X1 Other deformities of toe(s) (acquired), right foot: Secondary | ICD-10-CM | POA: Diagnosis not present

## 2020-02-23 DIAGNOSIS — F17219 Nicotine dependence, cigarettes, with unspecified nicotine-induced disorders: Secondary | ICD-10-CM | POA: Diagnosis not present

## 2020-02-23 DIAGNOSIS — Z131 Encounter for screening for diabetes mellitus: Secondary | ICD-10-CM | POA: Diagnosis not present

## 2020-02-23 DIAGNOSIS — M7661 Achilles tendinitis, right leg: Secondary | ICD-10-CM | POA: Diagnosis not present

## 2020-02-23 DIAGNOSIS — H26493 Other secondary cataract, bilateral: Secondary | ICD-10-CM | POA: Diagnosis not present

## 2020-02-23 DIAGNOSIS — H34812 Central retinal vein occlusion, left eye, with macular edema: Secondary | ICD-10-CM | POA: Diagnosis not present

## 2020-02-23 DIAGNOSIS — D6859 Other primary thrombophilia: Secondary | ICD-10-CM | POA: Diagnosis not present

## 2020-02-23 DIAGNOSIS — J329 Chronic sinusitis, unspecified: Secondary | ICD-10-CM | POA: Diagnosis not present

## 2020-02-23 DIAGNOSIS — H8103 Meniere's disease, bilateral: Secondary | ICD-10-CM | POA: Diagnosis not present

## 2020-02-23 DIAGNOSIS — E1110 Type 2 diabetes mellitus with ketoacidosis without coma: Secondary | ICD-10-CM | POA: Diagnosis not present

## 2020-02-23 DIAGNOSIS — C9000 Multiple myeloma not having achieved remission: Secondary | ICD-10-CM | POA: Diagnosis not present

## 2020-02-23 DIAGNOSIS — Z6830 Body mass index (BMI) 30.0-30.9, adult: Secondary | ICD-10-CM | POA: Diagnosis not present

## 2020-02-23 DIAGNOSIS — Z86718 Personal history of other venous thrombosis and embolism: Secondary | ICD-10-CM | POA: Diagnosis not present

## 2020-02-23 DIAGNOSIS — Z8249 Family history of ischemic heart disease and other diseases of the circulatory system: Secondary | ICD-10-CM | POA: Diagnosis not present

## 2020-02-23 DIAGNOSIS — R1032 Left lower quadrant pain: Secondary | ICD-10-CM | POA: Diagnosis not present

## 2020-02-23 DIAGNOSIS — E042 Nontoxic multinodular goiter: Secondary | ICD-10-CM | POA: Diagnosis not present

## 2020-02-24 DIAGNOSIS — K50919 Crohn's disease, unspecified, with unspecified complications: Secondary | ICD-10-CM | POA: Diagnosis not present

## 2020-02-24 DIAGNOSIS — G319 Degenerative disease of nervous system, unspecified: Secondary | ICD-10-CM | POA: Diagnosis not present

## 2020-02-24 DIAGNOSIS — Z8619 Personal history of other infectious and parasitic diseases: Secondary | ICD-10-CM | POA: Diagnosis not present

## 2020-02-24 DIAGNOSIS — I429 Cardiomyopathy, unspecified: Secondary | ICD-10-CM | POA: Diagnosis not present

## 2020-02-24 DIAGNOSIS — R413 Other amnesia: Secondary | ICD-10-CM | POA: Diagnosis not present

## 2020-02-24 DIAGNOSIS — I471 Supraventricular tachycardia: Secondary | ICD-10-CM | POA: Diagnosis not present

## 2020-02-24 DIAGNOSIS — G546 Phantom limb syndrome with pain: Secondary | ICD-10-CM | POA: Diagnosis not present

## 2020-02-24 DIAGNOSIS — T82837A Hemorrhage of cardiac prosthetic devices, implants and grafts, initial encounter: Secondary | ICD-10-CM | POA: Diagnosis not present

## 2020-02-24 DIAGNOSIS — K6389 Other specified diseases of intestine: Secondary | ICD-10-CM | POA: Diagnosis not present

## 2020-02-24 DIAGNOSIS — Z45018 Encounter for adjustment and management of other part of cardiac pacemaker: Secondary | ICD-10-CM | POA: Diagnosis not present

## 2020-02-24 DIAGNOSIS — H43812 Vitreous degeneration, left eye: Secondary | ICD-10-CM | POA: Diagnosis not present

## 2020-02-24 DIAGNOSIS — R32 Unspecified urinary incontinence: Secondary | ICD-10-CM | POA: Diagnosis not present

## 2020-02-24 DIAGNOSIS — S134XXA Sprain of ligaments of cervical spine, initial encounter: Secondary | ICD-10-CM | POA: Diagnosis not present

## 2020-02-24 DIAGNOSIS — I6389 Other cerebral infarction: Secondary | ICD-10-CM | POA: Diagnosis not present

## 2020-02-24 DIAGNOSIS — M25511 Pain in right shoulder: Secondary | ICD-10-CM | POA: Diagnosis not present

## 2020-02-24 DIAGNOSIS — R269 Unspecified abnormalities of gait and mobility: Secondary | ICD-10-CM | POA: Diagnosis not present

## 2020-02-24 DIAGNOSIS — H40013 Open angle with borderline findings, low risk, bilateral: Secondary | ICD-10-CM | POA: Diagnosis not present

## 2020-02-24 DIAGNOSIS — M7602 Gluteal tendinitis, left hip: Secondary | ICD-10-CM | POA: Diagnosis not present

## 2020-02-24 DIAGNOSIS — L97501 Non-pressure chronic ulcer of other part of unspecified foot limited to breakdown of skin: Secondary | ICD-10-CM | POA: Diagnosis not present

## 2020-02-24 DIAGNOSIS — H35342 Macular cyst, hole, or pseudohole, left eye: Secondary | ICD-10-CM | POA: Diagnosis not present

## 2020-02-24 DIAGNOSIS — Z452 Encounter for adjustment and management of vascular access device: Secondary | ICD-10-CM | POA: Diagnosis not present

## 2020-02-24 DIAGNOSIS — Z6841 Body Mass Index (BMI) 40.0 and over, adult: Secondary | ICD-10-CM | POA: Diagnosis not present

## 2020-02-24 DIAGNOSIS — Z833 Family history of diabetes mellitus: Secondary | ICD-10-CM | POA: Diagnosis not present

## 2020-02-24 DIAGNOSIS — N1831 Chronic kidney disease, stage 3a: Secondary | ICD-10-CM | POA: Diagnosis not present

## 2020-02-24 DIAGNOSIS — Z96641 Presence of right artificial hip joint: Secondary | ICD-10-CM | POA: Diagnosis not present

## 2020-02-24 DIAGNOSIS — M25611 Stiffness of right shoulder, not elsewhere classified: Secondary | ICD-10-CM | POA: Diagnosis not present

## 2020-02-24 DIAGNOSIS — R222 Localized swelling, mass and lump, trunk: Secondary | ICD-10-CM | POA: Diagnosis not present

## 2020-02-24 DIAGNOSIS — F410 Panic disorder [episodic paroxysmal anxiety] without agoraphobia: Secondary | ICD-10-CM | POA: Diagnosis not present

## 2020-02-24 DIAGNOSIS — R9430 Abnormal result of cardiovascular function study, unspecified: Secondary | ICD-10-CM | POA: Diagnosis not present

## 2020-02-24 DIAGNOSIS — S42302D Unspecified fracture of shaft of humerus, left arm, subsequent encounter for fracture with routine healing: Secondary | ICD-10-CM | POA: Diagnosis not present

## 2020-02-24 DIAGNOSIS — R1010 Upper abdominal pain, unspecified: Secondary | ICD-10-CM | POA: Diagnosis not present

## 2020-02-24 DIAGNOSIS — Z1389 Encounter for screening for other disorder: Secondary | ICD-10-CM | POA: Diagnosis not present

## 2020-02-24 DIAGNOSIS — S2242XA Multiple fractures of ribs, left side, initial encounter for closed fracture: Secondary | ICD-10-CM | POA: Diagnosis not present

## 2020-02-24 DIAGNOSIS — B181 Chronic viral hepatitis B without delta-agent: Secondary | ICD-10-CM | POA: Diagnosis not present

## 2020-02-24 DIAGNOSIS — M65341 Trigger finger, right ring finger: Secondary | ICD-10-CM | POA: Diagnosis not present

## 2020-02-24 DIAGNOSIS — Z1159 Encounter for screening for other viral diseases: Secondary | ICD-10-CM | POA: Diagnosis not present

## 2020-02-24 DIAGNOSIS — R55 Syncope and collapse: Secondary | ICD-10-CM | POA: Diagnosis not present

## 2020-02-24 DIAGNOSIS — R630 Anorexia: Secondary | ICD-10-CM | POA: Diagnosis not present

## 2020-02-24 DIAGNOSIS — R1110 Vomiting, unspecified: Secondary | ICD-10-CM | POA: Diagnosis not present

## 2020-02-24 DIAGNOSIS — T424X1A Poisoning by benzodiazepines, accidental (unintentional), initial encounter: Secondary | ICD-10-CM | POA: Diagnosis not present

## 2020-02-24 DIAGNOSIS — I671 Cerebral aneurysm, nonruptured: Secondary | ICD-10-CM | POA: Diagnosis not present

## 2020-02-24 DIAGNOSIS — Z6835 Body mass index (BMI) 35.0-35.9, adult: Secondary | ICD-10-CM | POA: Diagnosis not present

## 2020-02-24 DIAGNOSIS — S46011D Strain of muscle(s) and tendon(s) of the rotator cuff of right shoulder, subsequent encounter: Secondary | ICD-10-CM | POA: Diagnosis not present

## 2020-02-24 DIAGNOSIS — G7000 Myasthenia gravis without (acute) exacerbation: Secondary | ICD-10-CM | POA: Diagnosis not present

## 2020-02-24 DIAGNOSIS — M5136 Other intervertebral disc degeneration, lumbar region: Secondary | ICD-10-CM | POA: Diagnosis not present

## 2020-02-24 DIAGNOSIS — T451X5A Adverse effect of antineoplastic and immunosuppressive drugs, initial encounter: Secondary | ICD-10-CM | POA: Diagnosis not present

## 2020-02-24 DIAGNOSIS — I82412 Acute embolism and thrombosis of left femoral vein: Secondary | ICD-10-CM | POA: Diagnosis not present

## 2020-02-24 DIAGNOSIS — E1169 Type 2 diabetes mellitus with other specified complication: Secondary | ICD-10-CM | POA: Diagnosis not present

## 2020-02-24 DIAGNOSIS — E7800 Pure hypercholesterolemia, unspecified: Secondary | ICD-10-CM | POA: Diagnosis not present

## 2020-02-24 DIAGNOSIS — K838 Other specified diseases of biliary tract: Secondary | ICD-10-CM | POA: Diagnosis not present

## 2020-02-24 DIAGNOSIS — M62561 Muscle wasting and atrophy, not elsewhere classified, right lower leg: Secondary | ICD-10-CM | POA: Diagnosis not present

## 2020-02-24 DIAGNOSIS — D122 Benign neoplasm of ascending colon: Secondary | ICD-10-CM | POA: Diagnosis not present

## 2020-02-24 DIAGNOSIS — R001 Bradycardia, unspecified: Secondary | ICD-10-CM | POA: Diagnosis not present

## 2020-02-24 DIAGNOSIS — H538 Other visual disturbances: Secondary | ICD-10-CM | POA: Diagnosis not present

## 2020-02-24 DIAGNOSIS — Z8249 Family history of ischemic heart disease and other diseases of the circulatory system: Secondary | ICD-10-CM | POA: Diagnosis not present

## 2020-02-24 DIAGNOSIS — I878 Other specified disorders of veins: Secondary | ICD-10-CM | POA: Diagnosis not present

## 2020-02-24 DIAGNOSIS — D3611 Benign neoplasm of peripheral nerves and autonomic nervous system of face, head, and neck: Secondary | ICD-10-CM | POA: Diagnosis not present

## 2020-02-24 DIAGNOSIS — I509 Heart failure, unspecified: Secondary | ICD-10-CM | POA: Diagnosis not present

## 2020-02-24 DIAGNOSIS — G8929 Other chronic pain: Secondary | ICD-10-CM | POA: Diagnosis not present

## 2020-02-24 DIAGNOSIS — R1011 Right upper quadrant pain: Secondary | ICD-10-CM | POA: Diagnosis not present

## 2020-02-24 DIAGNOSIS — K643 Fourth degree hemorrhoids: Secondary | ICD-10-CM | POA: Diagnosis not present

## 2020-02-24 DIAGNOSIS — Z95810 Presence of automatic (implantable) cardiac defibrillator: Secondary | ICD-10-CM | POA: Diagnosis not present

## 2020-02-24 DIAGNOSIS — K50113 Crohn's disease of large intestine with fistula: Secondary | ICD-10-CM | POA: Diagnosis not present

## 2020-02-24 DIAGNOSIS — I69391 Dysphagia following cerebral infarction: Secondary | ICD-10-CM | POA: Diagnosis not present

## 2020-02-24 DIAGNOSIS — M67441 Ganglion, right hand: Secondary | ICD-10-CM | POA: Diagnosis not present

## 2020-02-24 DIAGNOSIS — S42201A Unspecified fracture of upper end of right humerus, initial encounter for closed fracture: Secondary | ICD-10-CM | POA: Diagnosis not present

## 2020-02-24 DIAGNOSIS — I38 Endocarditis, valve unspecified: Secondary | ICD-10-CM | POA: Diagnosis not present

## 2020-02-24 DIAGNOSIS — N8112 Cystocele, lateral: Secondary | ICD-10-CM | POA: Diagnosis not present

## 2020-02-24 DIAGNOSIS — M542 Cervicalgia: Secondary | ICD-10-CM | POA: Diagnosis not present

## 2020-02-24 DIAGNOSIS — Z96611 Presence of right artificial shoulder joint: Secondary | ICD-10-CM | POA: Diagnosis not present

## 2020-02-24 DIAGNOSIS — L821 Other seborrheic keratosis: Secondary | ICD-10-CM | POA: Diagnosis not present

## 2020-02-24 DIAGNOSIS — I6782 Cerebral ischemia: Secondary | ICD-10-CM | POA: Diagnosis not present

## 2020-02-24 DIAGNOSIS — M25512 Pain in left shoulder: Secondary | ICD-10-CM | POA: Diagnosis not present

## 2020-02-24 DIAGNOSIS — D6851 Activated protein C resistance: Secondary | ICD-10-CM | POA: Diagnosis not present

## 2020-02-24 DIAGNOSIS — Z87442 Personal history of urinary calculi: Secondary | ICD-10-CM | POA: Diagnosis not present

## 2020-02-24 DIAGNOSIS — M79641 Pain in right hand: Secondary | ICD-10-CM | POA: Diagnosis not present

## 2020-02-24 DIAGNOSIS — R530 Neoplastic (malignant) related fatigue: Secondary | ICD-10-CM | POA: Diagnosis not present

## 2020-02-24 DIAGNOSIS — M8949 Other hypertrophic osteoarthropathy, multiple sites: Secondary | ICD-10-CM | POA: Diagnosis not present

## 2020-02-24 DIAGNOSIS — I69320 Aphasia following cerebral infarction: Secondary | ICD-10-CM | POA: Diagnosis not present

## 2020-02-24 DIAGNOSIS — C44319 Basal cell carcinoma of skin of other parts of face: Secondary | ICD-10-CM | POA: Diagnosis not present

## 2020-02-24 DIAGNOSIS — M1711 Unilateral primary osteoarthritis, right knee: Secondary | ICD-10-CM | POA: Diagnosis not present

## 2020-02-24 DIAGNOSIS — M25762 Osteophyte, left knee: Secondary | ICD-10-CM | POA: Diagnosis not present

## 2020-02-24 DIAGNOSIS — C642 Malignant neoplasm of left kidney, except renal pelvis: Secondary | ICD-10-CM | POA: Diagnosis not present

## 2020-02-24 DIAGNOSIS — K7040 Alcoholic hepatic failure without coma: Secondary | ICD-10-CM | POA: Diagnosis not present

## 2020-02-24 DIAGNOSIS — M19042 Primary osteoarthritis, left hand: Secondary | ICD-10-CM | POA: Diagnosis not present

## 2020-02-24 DIAGNOSIS — H9190 Unspecified hearing loss, unspecified ear: Secondary | ICD-10-CM | POA: Diagnosis not present

## 2020-02-24 DIAGNOSIS — R208 Other disturbances of skin sensation: Secondary | ICD-10-CM | POA: Diagnosis not present

## 2020-02-24 DIAGNOSIS — H26493 Other secondary cataract, bilateral: Secondary | ICD-10-CM | POA: Diagnosis not present

## 2020-02-24 DIAGNOSIS — F422 Mixed obsessional thoughts and acts: Secondary | ICD-10-CM | POA: Diagnosis not present

## 2020-02-24 DIAGNOSIS — Z7902 Long term (current) use of antithrombotics/antiplatelets: Secondary | ICD-10-CM | POA: Diagnosis not present

## 2020-02-24 DIAGNOSIS — Z6832 Body mass index (BMI) 32.0-32.9, adult: Secondary | ICD-10-CM | POA: Diagnosis not present

## 2020-02-24 DIAGNOSIS — F17210 Nicotine dependence, cigarettes, uncomplicated: Secondary | ICD-10-CM | POA: Diagnosis not present

## 2020-02-24 DIAGNOSIS — M4716 Other spondylosis with myelopathy, lumbar region: Secondary | ICD-10-CM | POA: Diagnosis not present

## 2020-02-24 DIAGNOSIS — R262 Difficulty in walking, not elsewhere classified: Secondary | ICD-10-CM | POA: Diagnosis not present

## 2020-02-24 DIAGNOSIS — L570 Actinic keratosis: Secondary | ICD-10-CM | POA: Diagnosis not present

## 2020-02-24 DIAGNOSIS — N130 Hydronephrosis with ureteropelvic junction obstruction: Secondary | ICD-10-CM | POA: Diagnosis not present

## 2020-02-24 DIAGNOSIS — E6609 Other obesity due to excess calories: Secondary | ICD-10-CM | POA: Diagnosis not present

## 2020-02-24 DIAGNOSIS — M9904 Segmental and somatic dysfunction of sacral region: Secondary | ICD-10-CM | POA: Diagnosis not present

## 2020-02-24 DIAGNOSIS — R8290 Unspecified abnormal findings in urine: Secondary | ICD-10-CM | POA: Diagnosis not present

## 2020-02-24 DIAGNOSIS — I420 Dilated cardiomyopathy: Secondary | ICD-10-CM | POA: Diagnosis not present

## 2020-02-24 DIAGNOSIS — Z0189 Encounter for other specified special examinations: Secondary | ICD-10-CM | POA: Diagnosis not present

## 2020-02-24 DIAGNOSIS — M24112 Other articular cartilage disorders, left shoulder: Secondary | ICD-10-CM | POA: Diagnosis not present

## 2020-02-24 DIAGNOSIS — Z20822 Contact with and (suspected) exposure to covid-19: Secondary | ICD-10-CM | POA: Diagnosis not present

## 2020-02-24 DIAGNOSIS — Z83518 Family history of other specified eye disorder: Secondary | ICD-10-CM | POA: Diagnosis not present

## 2020-02-24 DIAGNOSIS — R338 Other retention of urine: Secondary | ICD-10-CM | POA: Diagnosis not present

## 2020-02-24 DIAGNOSIS — G936 Cerebral edema: Secondary | ICD-10-CM | POA: Diagnosis not present

## 2020-02-24 DIAGNOSIS — Z85828 Personal history of other malignant neoplasm of skin: Secondary | ICD-10-CM | POA: Diagnosis not present

## 2020-02-24 DIAGNOSIS — Z0001 Encounter for general adult medical examination with abnormal findings: Secondary | ICD-10-CM | POA: Diagnosis not present

## 2020-02-24 DIAGNOSIS — Z961 Presence of intraocular lens: Secondary | ICD-10-CM | POA: Diagnosis not present

## 2020-02-24 DIAGNOSIS — R7989 Other specified abnormal findings of blood chemistry: Secondary | ICD-10-CM | POA: Diagnosis not present

## 2020-02-24 DIAGNOSIS — H5310 Unspecified subjective visual disturbances: Secondary | ICD-10-CM | POA: Diagnosis not present

## 2020-02-24 DIAGNOSIS — H9122 Sudden idiopathic hearing loss, left ear: Secondary | ICD-10-CM | POA: Diagnosis not present

## 2020-02-24 DIAGNOSIS — E109 Type 1 diabetes mellitus without complications: Secondary | ICD-10-CM | POA: Diagnosis not present

## 2020-02-24 DIAGNOSIS — M4622 Osteomyelitis of vertebra, cervical region: Secondary | ICD-10-CM | POA: Diagnosis not present

## 2020-02-24 DIAGNOSIS — I5030 Unspecified diastolic (congestive) heart failure: Secondary | ICD-10-CM | POA: Diagnosis not present

## 2020-02-24 DIAGNOSIS — N08 Glomerular disorders in diseases classified elsewhere: Secondary | ICD-10-CM | POA: Diagnosis not present

## 2020-02-24 DIAGNOSIS — R531 Weakness: Secondary | ICD-10-CM | POA: Diagnosis not present

## 2020-02-24 DIAGNOSIS — K2270 Barrett's esophagus without dysplasia: Secondary | ICD-10-CM | POA: Diagnosis not present

## 2020-02-24 DIAGNOSIS — K621 Rectal polyp: Secondary | ICD-10-CM | POA: Diagnosis not present

## 2020-02-24 DIAGNOSIS — M1812 Unilateral primary osteoarthritis of first carpometacarpal joint, left hand: Secondary | ICD-10-CM | POA: Diagnosis not present

## 2020-02-24 DIAGNOSIS — R030 Elevated blood-pressure reading, without diagnosis of hypertension: Secondary | ICD-10-CM | POA: Diagnosis not present

## 2020-02-24 DIAGNOSIS — I428 Other cardiomyopathies: Secondary | ICD-10-CM | POA: Diagnosis not present

## 2020-02-24 DIAGNOSIS — Z992 Dependence on renal dialysis: Secondary | ICD-10-CM | POA: Diagnosis not present

## 2020-02-24 DIAGNOSIS — L4050 Arthropathic psoriasis, unspecified: Secondary | ICD-10-CM | POA: Diagnosis not present

## 2020-02-24 DIAGNOSIS — Z7984 Long term (current) use of oral hypoglycemic drugs: Secondary | ICD-10-CM | POA: Diagnosis not present

## 2020-02-24 DIAGNOSIS — J431 Panlobular emphysema: Secondary | ICD-10-CM | POA: Diagnosis not present

## 2020-02-24 DIAGNOSIS — Z886 Allergy status to analgesic agent status: Secondary | ICD-10-CM | POA: Diagnosis not present

## 2020-02-24 DIAGNOSIS — M25622 Stiffness of left elbow, not elsewhere classified: Secondary | ICD-10-CM | POA: Diagnosis not present

## 2020-02-24 DIAGNOSIS — S43422A Sprain of left rotator cuff capsule, initial encounter: Secondary | ICD-10-CM | POA: Diagnosis not present

## 2020-02-24 DIAGNOSIS — J679 Hypersensitivity pneumonitis due to unspecified organic dust: Secondary | ICD-10-CM | POA: Diagnosis not present

## 2020-02-24 DIAGNOSIS — E049 Nontoxic goiter, unspecified: Secondary | ICD-10-CM | POA: Diagnosis not present

## 2020-02-24 DIAGNOSIS — G3109 Other frontotemporal dementia: Secondary | ICD-10-CM | POA: Diagnosis not present

## 2020-02-24 DIAGNOSIS — Z79811 Long term (current) use of aromatase inhibitors: Secondary | ICD-10-CM | POA: Diagnosis not present

## 2020-02-24 DIAGNOSIS — Z90721 Acquired absence of ovaries, unilateral: Secondary | ICD-10-CM | POA: Diagnosis not present

## 2020-02-24 DIAGNOSIS — I480 Paroxysmal atrial fibrillation: Secondary | ICD-10-CM | POA: Diagnosis not present

## 2020-02-24 DIAGNOSIS — D6859 Other primary thrombophilia: Secondary | ICD-10-CM | POA: Diagnosis not present

## 2020-02-24 DIAGNOSIS — M4155 Other secondary scoliosis, thoracolumbar region: Secondary | ICD-10-CM | POA: Diagnosis not present

## 2020-02-24 DIAGNOSIS — Z20828 Contact with and (suspected) exposure to other viral communicable diseases: Secondary | ICD-10-CM | POA: Diagnosis not present

## 2020-02-24 DIAGNOSIS — R4781 Slurred speech: Secondary | ICD-10-CM | POA: Diagnosis not present

## 2020-02-24 DIAGNOSIS — I619 Nontraumatic intracerebral hemorrhage, unspecified: Secondary | ICD-10-CM | POA: Diagnosis not present

## 2020-02-24 DIAGNOSIS — I82612 Acute embolism and thrombosis of superficial veins of left upper extremity: Secondary | ICD-10-CM | POA: Diagnosis not present

## 2020-02-24 DIAGNOSIS — E291 Testicular hypofunction: Secondary | ICD-10-CM | POA: Diagnosis not present

## 2020-02-24 DIAGNOSIS — R109 Unspecified abdominal pain: Secondary | ICD-10-CM | POA: Diagnosis not present

## 2020-02-24 DIAGNOSIS — I456 Pre-excitation syndrome: Secondary | ICD-10-CM | POA: Diagnosis not present

## 2020-02-24 DIAGNOSIS — E7849 Other hyperlipidemia: Secondary | ICD-10-CM | POA: Diagnosis not present

## 2020-02-24 DIAGNOSIS — H90A22 Sensorineural hearing loss, unilateral, left ear, with restricted hearing on the contralateral side: Secondary | ICD-10-CM | POA: Diagnosis not present

## 2020-02-24 DIAGNOSIS — E889 Metabolic disorder, unspecified: Secondary | ICD-10-CM | POA: Diagnosis not present

## 2020-02-24 DIAGNOSIS — M9902 Segmental and somatic dysfunction of thoracic region: Secondary | ICD-10-CM | POA: Diagnosis not present

## 2020-02-24 DIAGNOSIS — M79675 Pain in left toe(s): Secondary | ICD-10-CM | POA: Diagnosis not present

## 2020-02-24 DIAGNOSIS — R5382 Chronic fatigue, unspecified: Secondary | ICD-10-CM | POA: Diagnosis not present

## 2020-02-24 DIAGNOSIS — A419 Sepsis, unspecified organism: Secondary | ICD-10-CM | POA: Diagnosis not present

## 2020-02-24 DIAGNOSIS — I132 Hypertensive heart and chronic kidney disease with heart failure and with stage 5 chronic kidney disease, or end stage renal disease: Secondary | ICD-10-CM | POA: Diagnosis not present

## 2020-02-24 DIAGNOSIS — J439 Emphysema, unspecified: Secondary | ICD-10-CM | POA: Diagnosis not present

## 2020-02-24 DIAGNOSIS — I69828 Other speech and language deficits following other cerebrovascular disease: Secondary | ICD-10-CM | POA: Diagnosis not present

## 2020-02-24 DIAGNOSIS — X32XXXD Exposure to sunlight, subsequent encounter: Secondary | ICD-10-CM | POA: Diagnosis not present

## 2020-02-24 DIAGNOSIS — M48061 Spinal stenosis, lumbar region without neurogenic claudication: Secondary | ICD-10-CM | POA: Diagnosis not present

## 2020-02-24 DIAGNOSIS — H90A31 Mixed conductive and sensorineural hearing loss, unilateral, right ear with restricted hearing on the contralateral side: Secondary | ICD-10-CM | POA: Diagnosis not present

## 2020-02-24 DIAGNOSIS — E559 Vitamin D deficiency, unspecified: Secondary | ICD-10-CM | POA: Diagnosis not present

## 2020-02-24 DIAGNOSIS — L97519 Non-pressure chronic ulcer of other part of right foot with unspecified severity: Secondary | ICD-10-CM | POA: Diagnosis not present

## 2020-02-24 DIAGNOSIS — I472 Ventricular tachycardia: Secondary | ICD-10-CM | POA: Diagnosis not present

## 2020-02-24 DIAGNOSIS — E1140 Type 2 diabetes mellitus with diabetic neuropathy, unspecified: Secondary | ICD-10-CM | POA: Diagnosis not present

## 2020-02-24 DIAGNOSIS — M5440 Lumbago with sciatica, unspecified side: Secondary | ICD-10-CM | POA: Diagnosis not present

## 2020-02-24 DIAGNOSIS — I313 Pericardial effusion (noninflammatory): Secondary | ICD-10-CM | POA: Diagnosis not present

## 2020-02-24 DIAGNOSIS — Z9981 Dependence on supplemental oxygen: Secondary | ICD-10-CM | POA: Diagnosis not present

## 2020-02-24 DIAGNOSIS — Z7189 Other specified counseling: Secondary | ICD-10-CM | POA: Diagnosis not present

## 2020-02-24 DIAGNOSIS — Z9049 Acquired absence of other specified parts of digestive tract: Secondary | ICD-10-CM | POA: Diagnosis not present

## 2020-02-24 DIAGNOSIS — L03313 Cellulitis of chest wall: Secondary | ICD-10-CM | POA: Diagnosis not present

## 2020-02-24 DIAGNOSIS — M19049 Primary osteoarthritis, unspecified hand: Secondary | ICD-10-CM | POA: Diagnosis not present

## 2020-02-24 DIAGNOSIS — Z800 Family history of malignant neoplasm of digestive organs: Secondary | ICD-10-CM | POA: Diagnosis not present

## 2020-02-24 DIAGNOSIS — R140 Abdominal distension (gaseous): Secondary | ICD-10-CM | POA: Diagnosis not present

## 2020-02-24 DIAGNOSIS — R252 Cramp and spasm: Secondary | ICD-10-CM | POA: Diagnosis not present

## 2020-02-24 DIAGNOSIS — C672 Malignant neoplasm of lateral wall of bladder: Secondary | ICD-10-CM | POA: Diagnosis not present

## 2020-02-24 DIAGNOSIS — D0511 Intraductal carcinoma in situ of right breast: Secondary | ICD-10-CM | POA: Diagnosis not present

## 2020-02-24 DIAGNOSIS — J4540 Moderate persistent asthma, uncomplicated: Secondary | ICD-10-CM | POA: Diagnosis not present

## 2020-02-24 DIAGNOSIS — M5412 Radiculopathy, cervical region: Secondary | ICD-10-CM | POA: Diagnosis not present

## 2020-02-24 DIAGNOSIS — L709 Acne, unspecified: Secondary | ICD-10-CM | POA: Diagnosis not present

## 2020-02-24 DIAGNOSIS — H353122 Nonexudative age-related macular degeneration, left eye, intermediate dry stage: Secondary | ICD-10-CM | POA: Diagnosis not present

## 2020-02-24 DIAGNOSIS — M25571 Pain in right ankle and joints of right foot: Secondary | ICD-10-CM | POA: Diagnosis not present

## 2020-02-24 DIAGNOSIS — K5900 Constipation, unspecified: Secondary | ICD-10-CM | POA: Diagnosis not present

## 2020-02-24 DIAGNOSIS — N3091 Cystitis, unspecified with hematuria: Secondary | ICD-10-CM | POA: Diagnosis not present

## 2020-02-24 DIAGNOSIS — B354 Tinea corporis: Secondary | ICD-10-CM | POA: Diagnosis not present

## 2020-02-24 DIAGNOSIS — C3491 Malignant neoplasm of unspecified part of right bronchus or lung: Secondary | ICD-10-CM | POA: Diagnosis not present

## 2020-02-24 DIAGNOSIS — U071 COVID-19: Secondary | ICD-10-CM | POA: Diagnosis not present

## 2020-02-24 DIAGNOSIS — Z6830 Body mass index (BMI) 30.0-30.9, adult: Secondary | ICD-10-CM | POA: Diagnosis not present

## 2020-02-24 DIAGNOSIS — T6701XA Heatstroke and sunstroke, initial encounter: Secondary | ICD-10-CM | POA: Diagnosis not present

## 2020-02-24 DIAGNOSIS — H6981 Other specified disorders of Eustachian tube, right ear: Secondary | ICD-10-CM | POA: Diagnosis not present

## 2020-02-24 DIAGNOSIS — E890 Postprocedural hypothyroidism: Secondary | ICD-10-CM | POA: Diagnosis not present

## 2020-02-24 DIAGNOSIS — T84090A Other mechanical complication of internal right hip prosthesis, initial encounter: Secondary | ICD-10-CM | POA: Diagnosis not present

## 2020-02-24 DIAGNOSIS — I959 Hypotension, unspecified: Secondary | ICD-10-CM | POA: Diagnosis not present

## 2020-02-24 DIAGNOSIS — Z881 Allergy status to other antibiotic agents status: Secondary | ICD-10-CM | POA: Diagnosis not present

## 2020-02-24 DIAGNOSIS — S82402A Unspecified fracture of shaft of left fibula, initial encounter for closed fracture: Secondary | ICD-10-CM | POA: Diagnosis not present

## 2020-02-24 DIAGNOSIS — S32592A Other specified fracture of left pubis, initial encounter for closed fracture: Secondary | ICD-10-CM | POA: Diagnosis not present

## 2020-02-24 DIAGNOSIS — E538 Deficiency of other specified B group vitamins: Secondary | ICD-10-CM | POA: Diagnosis not present

## 2020-02-24 DIAGNOSIS — Q600 Renal agenesis, unilateral: Secondary | ICD-10-CM | POA: Diagnosis not present

## 2020-02-24 DIAGNOSIS — R972 Elevated prostate specific antigen [PSA]: Secondary | ICD-10-CM | POA: Diagnosis not present

## 2020-02-24 DIAGNOSIS — H2513 Age-related nuclear cataract, bilateral: Secondary | ICD-10-CM | POA: Diagnosis not present

## 2020-02-24 DIAGNOSIS — K7031 Alcoholic cirrhosis of liver with ascites: Secondary | ICD-10-CM | POA: Diagnosis not present

## 2020-02-24 DIAGNOSIS — C538 Malignant neoplasm of overlapping sites of cervix uteri: Secondary | ICD-10-CM | POA: Diagnosis not present

## 2020-02-24 DIAGNOSIS — L820 Inflamed seborrheic keratosis: Secondary | ICD-10-CM | POA: Diagnosis not present

## 2020-02-24 DIAGNOSIS — C799 Secondary malignant neoplasm of unspecified site: Secondary | ICD-10-CM | POA: Diagnosis not present

## 2020-02-24 DIAGNOSIS — Z96642 Presence of left artificial hip joint: Secondary | ICD-10-CM | POA: Diagnosis not present

## 2020-02-24 DIAGNOSIS — R279 Unspecified lack of coordination: Secondary | ICD-10-CM | POA: Diagnosis not present

## 2020-02-24 DIAGNOSIS — G629 Polyneuropathy, unspecified: Secondary | ICD-10-CM | POA: Diagnosis not present

## 2020-02-24 DIAGNOSIS — Z713 Dietary counseling and surveillance: Secondary | ICD-10-CM | POA: Diagnosis not present

## 2020-02-24 DIAGNOSIS — M25462 Effusion, left knee: Secondary | ICD-10-CM | POA: Diagnosis not present

## 2020-02-24 DIAGNOSIS — M5489 Other dorsalgia: Secondary | ICD-10-CM | POA: Diagnosis not present

## 2020-02-24 DIAGNOSIS — Z79899 Other long term (current) drug therapy: Secondary | ICD-10-CM | POA: Diagnosis not present

## 2020-02-24 DIAGNOSIS — M75112 Incomplete rotator cuff tear or rupture of left shoulder, not specified as traumatic: Secondary | ICD-10-CM | POA: Diagnosis not present

## 2020-02-24 DIAGNOSIS — F3181 Bipolar II disorder: Secondary | ICD-10-CM | POA: Diagnosis not present

## 2020-02-24 DIAGNOSIS — E113391 Type 2 diabetes mellitus with moderate nonproliferative diabetic retinopathy without macular edema, right eye: Secondary | ICD-10-CM | POA: Diagnosis not present

## 2020-02-24 DIAGNOSIS — M7138 Other bursal cyst, other site: Secondary | ICD-10-CM | POA: Diagnosis not present

## 2020-02-24 DIAGNOSIS — S01112A Laceration without foreign body of left eyelid and periocular area, initial encounter: Secondary | ICD-10-CM | POA: Diagnosis not present

## 2020-02-24 DIAGNOSIS — G4700 Insomnia, unspecified: Secondary | ICD-10-CM | POA: Diagnosis not present

## 2020-02-24 DIAGNOSIS — S91101A Unspecified open wound of right great toe without damage to nail, initial encounter: Secondary | ICD-10-CM | POA: Diagnosis not present

## 2020-02-24 DIAGNOSIS — Q383 Other congenital malformations of tongue: Secondary | ICD-10-CM | POA: Diagnosis not present

## 2020-02-24 DIAGNOSIS — L84 Corns and callosities: Secondary | ICD-10-CM | POA: Diagnosis not present

## 2020-02-24 DIAGNOSIS — G4089 Other seizures: Secondary | ICD-10-CM | POA: Diagnosis not present

## 2020-02-24 DIAGNOSIS — L03113 Cellulitis of right upper limb: Secondary | ICD-10-CM | POA: Diagnosis not present

## 2020-02-24 DIAGNOSIS — R200 Anesthesia of skin: Secondary | ICD-10-CM | POA: Diagnosis not present

## 2020-02-24 DIAGNOSIS — Z111 Encounter for screening for respiratory tuberculosis: Secondary | ICD-10-CM | POA: Diagnosis not present

## 2020-02-24 DIAGNOSIS — M65342 Trigger finger, left ring finger: Secondary | ICD-10-CM | POA: Diagnosis not present

## 2020-02-24 DIAGNOSIS — Z85038 Personal history of other malignant neoplasm of large intestine: Secondary | ICD-10-CM | POA: Diagnosis not present

## 2020-02-24 DIAGNOSIS — M25551 Pain in right hip: Secondary | ICD-10-CM | POA: Diagnosis not present

## 2020-02-24 DIAGNOSIS — Z716 Tobacco abuse counseling: Secondary | ICD-10-CM | POA: Diagnosis not present

## 2020-02-24 DIAGNOSIS — S143XXD Injury of brachial plexus, subsequent encounter: Secondary | ICD-10-CM | POA: Diagnosis not present

## 2020-02-24 DIAGNOSIS — R17 Unspecified jaundice: Secondary | ICD-10-CM | POA: Diagnosis not present

## 2020-02-24 DIAGNOSIS — R29703 NIHSS score 3: Secondary | ICD-10-CM | POA: Diagnosis not present

## 2020-02-24 DIAGNOSIS — M89371 Hypertrophy of bone, right ankle and foot: Secondary | ICD-10-CM | POA: Diagnosis not present

## 2020-02-24 DIAGNOSIS — R0601 Orthopnea: Secondary | ICD-10-CM | POA: Diagnosis not present

## 2020-02-24 DIAGNOSIS — S82301A Unspecified fracture of lower end of right tibia, initial encounter for closed fracture: Secondary | ICD-10-CM | POA: Diagnosis not present

## 2020-02-24 DIAGNOSIS — R05 Cough: Secondary | ICD-10-CM | POA: Diagnosis not present

## 2020-02-24 DIAGNOSIS — L03115 Cellulitis of right lower limb: Secondary | ICD-10-CM | POA: Diagnosis not present

## 2020-02-24 DIAGNOSIS — N1832 Chronic kidney disease, stage 3b: Secondary | ICD-10-CM | POA: Diagnosis not present

## 2020-02-24 DIAGNOSIS — Z936 Other artificial openings of urinary tract status: Secondary | ICD-10-CM | POA: Diagnosis not present

## 2020-02-24 DIAGNOSIS — M7042 Prepatellar bursitis, left knee: Secondary | ICD-10-CM | POA: Diagnosis not present

## 2020-02-24 DIAGNOSIS — D3610 Benign neoplasm of peripheral nerves and autonomic nervous system, unspecified: Secondary | ICD-10-CM | POA: Diagnosis not present

## 2020-02-24 DIAGNOSIS — S93602A Unspecified sprain of left foot, initial encounter: Secondary | ICD-10-CM | POA: Diagnosis not present

## 2020-02-24 DIAGNOSIS — R5381 Other malaise: Secondary | ICD-10-CM | POA: Diagnosis not present

## 2020-02-24 DIAGNOSIS — Z6831 Body mass index (BMI) 31.0-31.9, adult: Secondary | ICD-10-CM | POA: Diagnosis not present

## 2020-02-24 DIAGNOSIS — R1312 Dysphagia, oropharyngeal phase: Secondary | ICD-10-CM | POA: Diagnosis not present

## 2020-02-24 DIAGNOSIS — M238X1 Other internal derangements of right knee: Secondary | ICD-10-CM | POA: Diagnosis not present

## 2020-02-24 DIAGNOSIS — G308 Other Alzheimer's disease: Secondary | ICD-10-CM | POA: Diagnosis not present

## 2020-02-24 DIAGNOSIS — Z23 Encounter for immunization: Secondary | ICD-10-CM | POA: Diagnosis not present

## 2020-02-24 DIAGNOSIS — R002 Palpitations: Secondary | ICD-10-CM | POA: Diagnosis not present

## 2020-02-24 DIAGNOSIS — M2042 Other hammer toe(s) (acquired), left foot: Secondary | ICD-10-CM | POA: Diagnosis not present

## 2020-02-24 DIAGNOSIS — M10472 Other secondary gout, left ankle and foot: Secondary | ICD-10-CM | POA: Diagnosis not present

## 2020-02-24 DIAGNOSIS — R238 Other skin changes: Secondary | ICD-10-CM | POA: Diagnosis not present

## 2020-02-24 DIAGNOSIS — Z681 Body mass index (BMI) 19 or less, adult: Secondary | ICD-10-CM | POA: Diagnosis not present

## 2020-02-24 DIAGNOSIS — B0229 Other postherpetic nervous system involvement: Secondary | ICD-10-CM | POA: Diagnosis not present

## 2020-02-24 DIAGNOSIS — R0789 Other chest pain: Secondary | ICD-10-CM | POA: Diagnosis not present

## 2020-02-24 DIAGNOSIS — H02135 Senile ectropion of left lower eyelid: Secondary | ICD-10-CM | POA: Diagnosis not present

## 2020-02-24 DIAGNOSIS — J9811 Atelectasis: Secondary | ICD-10-CM | POA: Diagnosis not present

## 2020-02-24 DIAGNOSIS — D500 Iron deficiency anemia secondary to blood loss (chronic): Secondary | ICD-10-CM | POA: Diagnosis not present

## 2020-02-24 DIAGNOSIS — H8111 Benign paroxysmal vertigo, right ear: Secondary | ICD-10-CM | POA: Diagnosis not present

## 2020-02-24 DIAGNOSIS — Z131 Encounter for screening for diabetes mellitus: Secondary | ICD-10-CM | POA: Diagnosis not present

## 2020-02-24 DIAGNOSIS — M19041 Primary osteoarthritis, right hand: Secondary | ICD-10-CM | POA: Diagnosis not present

## 2020-02-24 DIAGNOSIS — I5043 Acute on chronic combined systolic (congestive) and diastolic (congestive) heart failure: Secondary | ICD-10-CM | POA: Diagnosis not present

## 2020-02-24 DIAGNOSIS — N390 Urinary tract infection, site not specified: Secondary | ICD-10-CM | POA: Diagnosis not present

## 2020-02-24 DIAGNOSIS — L574 Cutis laxa senilis: Secondary | ICD-10-CM | POA: Diagnosis not present

## 2020-02-24 DIAGNOSIS — R198 Other specified symptoms and signs involving the digestive system and abdomen: Secondary | ICD-10-CM | POA: Diagnosis not present

## 2020-02-24 DIAGNOSIS — Z13820 Encounter for screening for osteoporosis: Secondary | ICD-10-CM | POA: Diagnosis not present

## 2020-02-24 DIAGNOSIS — E872 Acidosis: Secondary | ICD-10-CM | POA: Diagnosis not present

## 2020-02-24 DIAGNOSIS — Z6828 Body mass index (BMI) 28.0-28.9, adult: Secondary | ICD-10-CM | POA: Diagnosis not present

## 2020-02-24 DIAGNOSIS — M48062 Spinal stenosis, lumbar region with neurogenic claudication: Secondary | ICD-10-CM | POA: Diagnosis not present

## 2020-02-24 DIAGNOSIS — C8590 Non-Hodgkin lymphoma, unspecified, unspecified site: Secondary | ICD-10-CM | POA: Diagnosis not present

## 2020-02-24 DIAGNOSIS — K580 Irritable bowel syndrome with diarrhea: Secondary | ICD-10-CM | POA: Diagnosis not present

## 2020-02-24 DIAGNOSIS — S32020A Wedge compression fracture of second lumbar vertebra, initial encounter for closed fracture: Secondary | ICD-10-CM | POA: Diagnosis not present

## 2020-02-24 DIAGNOSIS — G43709 Chronic migraine without aura, not intractable, without status migrainosus: Secondary | ICD-10-CM | POA: Diagnosis not present

## 2020-02-24 DIAGNOSIS — C4442 Squamous cell carcinoma of skin of scalp and neck: Secondary | ICD-10-CM | POA: Diagnosis not present

## 2020-02-24 DIAGNOSIS — M23204 Derangement of unspecified medial meniscus due to old tear or injury, left knee: Secondary | ICD-10-CM | POA: Diagnosis not present

## 2020-02-24 DIAGNOSIS — R768 Other specified abnormal immunological findings in serum: Secondary | ICD-10-CM | POA: Diagnosis not present

## 2020-02-24 DIAGNOSIS — H44119 Panuveitis, unspecified eye: Secondary | ICD-10-CM | POA: Diagnosis not present

## 2020-02-24 DIAGNOSIS — N3090 Cystitis, unspecified without hematuria: Secondary | ICD-10-CM | POA: Diagnosis not present

## 2020-02-24 DIAGNOSIS — X32XXXA Exposure to sunlight, initial encounter: Secondary | ICD-10-CM | POA: Diagnosis not present

## 2020-02-24 DIAGNOSIS — N281 Cyst of kidney, acquired: Secondary | ICD-10-CM | POA: Diagnosis not present

## 2020-02-24 DIAGNOSIS — H16221 Keratoconjunctivitis sicca, not specified as Sjogren's, right eye: Secondary | ICD-10-CM | POA: Diagnosis not present

## 2020-02-24 DIAGNOSIS — L900 Lichen sclerosus et atrophicus: Secondary | ICD-10-CM | POA: Diagnosis not present

## 2020-02-24 DIAGNOSIS — M5135 Other intervertebral disc degeneration, thoracolumbar region: Secondary | ICD-10-CM | POA: Diagnosis not present

## 2020-02-24 DIAGNOSIS — S5001XA Contusion of right elbow, initial encounter: Secondary | ICD-10-CM | POA: Diagnosis not present

## 2020-02-24 DIAGNOSIS — R6884 Jaw pain: Secondary | ICD-10-CM | POA: Diagnosis not present

## 2020-02-24 DIAGNOSIS — D638 Anemia in other chronic diseases classified elsewhere: Secondary | ICD-10-CM | POA: Diagnosis not present

## 2020-02-24 DIAGNOSIS — N3941 Urge incontinence: Secondary | ICD-10-CM | POA: Diagnosis not present

## 2020-02-24 DIAGNOSIS — H00024 Hordeolum internum left upper eyelid: Secondary | ICD-10-CM | POA: Diagnosis not present

## 2020-02-24 DIAGNOSIS — E113593 Type 2 diabetes mellitus with proliferative diabetic retinopathy without macular edema, bilateral: Secondary | ICD-10-CM | POA: Diagnosis not present

## 2020-02-24 DIAGNOSIS — N2889 Other specified disorders of kidney and ureter: Secondary | ICD-10-CM | POA: Diagnosis not present

## 2020-02-24 DIAGNOSIS — H353211 Exudative age-related macular degeneration, right eye, with active choroidal neovascularization: Secondary | ICD-10-CM | POA: Diagnosis not present

## 2020-02-24 DIAGNOSIS — S338XXA Sprain of other parts of lumbar spine and pelvis, initial encounter: Secondary | ICD-10-CM | POA: Diagnosis not present

## 2020-02-24 DIAGNOSIS — N184 Chronic kidney disease, stage 4 (severe): Secondary | ICD-10-CM | POA: Diagnosis not present

## 2020-02-24 DIAGNOSIS — I452 Bifascicular block: Secondary | ICD-10-CM | POA: Diagnosis not present

## 2020-02-24 DIAGNOSIS — M79609 Pain in unspecified limb: Secondary | ICD-10-CM | POA: Diagnosis not present

## 2020-02-24 DIAGNOSIS — H9202 Otalgia, left ear: Secondary | ICD-10-CM | POA: Diagnosis not present

## 2020-02-24 DIAGNOSIS — D229 Melanocytic nevi, unspecified: Secondary | ICD-10-CM | POA: Diagnosis not present

## 2020-02-24 DIAGNOSIS — C649 Malignant neoplasm of unspecified kidney, except renal pelvis: Secondary | ICD-10-CM | POA: Diagnosis not present

## 2020-02-24 DIAGNOSIS — I6202 Nontraumatic subacute subdural hemorrhage: Secondary | ICD-10-CM | POA: Diagnosis not present

## 2020-02-24 DIAGNOSIS — I6529 Occlusion and stenosis of unspecified carotid artery: Secondary | ICD-10-CM | POA: Diagnosis not present

## 2020-02-24 DIAGNOSIS — Z6839 Body mass index (BMI) 39.0-39.9, adult: Secondary | ICD-10-CM | POA: Diagnosis not present

## 2020-02-24 DIAGNOSIS — H53462 Homonymous bilateral field defects, left side: Secondary | ICD-10-CM | POA: Diagnosis not present

## 2020-02-24 DIAGNOSIS — K921 Melena: Secondary | ICD-10-CM | POA: Diagnosis not present

## 2020-02-24 DIAGNOSIS — F5101 Primary insomnia: Secondary | ICD-10-CM | POA: Diagnosis not present

## 2020-02-24 DIAGNOSIS — Z8489 Family history of other specified conditions: Secondary | ICD-10-CM | POA: Diagnosis not present

## 2020-02-24 DIAGNOSIS — D6861 Antiphospholipid syndrome: Secondary | ICD-10-CM | POA: Diagnosis not present

## 2020-02-24 DIAGNOSIS — Z87440 Personal history of urinary (tract) infections: Secondary | ICD-10-CM | POA: Diagnosis not present

## 2020-02-24 DIAGNOSIS — I890 Lymphedema, not elsewhere classified: Secondary | ICD-10-CM | POA: Diagnosis not present

## 2020-02-24 DIAGNOSIS — M9901 Segmental and somatic dysfunction of cervical region: Secondary | ICD-10-CM | POA: Diagnosis not present

## 2020-02-24 DIAGNOSIS — I6523 Occlusion and stenosis of bilateral carotid arteries: Secondary | ICD-10-CM | POA: Diagnosis not present

## 2020-02-24 DIAGNOSIS — M7502 Adhesive capsulitis of left shoulder: Secondary | ICD-10-CM | POA: Diagnosis not present

## 2020-02-24 DIAGNOSIS — R590 Localized enlarged lymph nodes: Secondary | ICD-10-CM | POA: Diagnosis not present

## 2020-02-24 DIAGNOSIS — M4805 Spinal stenosis, thoracolumbar region: Secondary | ICD-10-CM | POA: Diagnosis not present

## 2020-02-24 DIAGNOSIS — F3340 Major depressive disorder, recurrent, in remission, unspecified: Secondary | ICD-10-CM | POA: Diagnosis not present

## 2020-02-24 DIAGNOSIS — I2583 Coronary atherosclerosis due to lipid rich plaque: Secondary | ICD-10-CM | POA: Diagnosis not present

## 2020-02-24 DIAGNOSIS — J9610 Chronic respiratory failure, unspecified whether with hypoxia or hypercapnia: Secondary | ICD-10-CM | POA: Diagnosis not present

## 2020-02-24 DIAGNOSIS — L812 Freckles: Secondary | ICD-10-CM | POA: Diagnosis not present

## 2020-02-24 DIAGNOSIS — I5022 Chronic systolic (congestive) heart failure: Secondary | ICD-10-CM | POA: Diagnosis not present

## 2020-02-24 DIAGNOSIS — K5909 Other constipation: Secondary | ICD-10-CM | POA: Diagnosis not present

## 2020-02-24 DIAGNOSIS — M4722 Other spondylosis with radiculopathy, cervical region: Secondary | ICD-10-CM | POA: Diagnosis not present

## 2020-02-24 DIAGNOSIS — L258 Unspecified contact dermatitis due to other agents: Secondary | ICD-10-CM | POA: Diagnosis not present

## 2020-02-24 DIAGNOSIS — M7918 Myalgia, other site: Secondary | ICD-10-CM | POA: Diagnosis not present

## 2020-02-24 DIAGNOSIS — Z794 Long term (current) use of insulin: Secondary | ICD-10-CM | POA: Diagnosis not present

## 2020-02-24 DIAGNOSIS — M1611 Unilateral primary osteoarthritis, right hip: Secondary | ICD-10-CM | POA: Diagnosis not present

## 2020-02-24 DIAGNOSIS — E119 Type 2 diabetes mellitus without complications: Secondary | ICD-10-CM | POA: Diagnosis not present

## 2020-02-24 DIAGNOSIS — K2970 Gastritis, unspecified, without bleeding: Secondary | ICD-10-CM | POA: Diagnosis not present

## 2020-02-24 DIAGNOSIS — M955 Acquired deformity of pelvis: Secondary | ICD-10-CM | POA: Diagnosis not present

## 2020-02-24 DIAGNOSIS — E785 Hyperlipidemia, unspecified: Secondary | ICD-10-CM | POA: Diagnosis not present

## 2020-02-24 DIAGNOSIS — I25118 Atherosclerotic heart disease of native coronary artery with other forms of angina pectoris: Secondary | ICD-10-CM | POA: Diagnosis not present

## 2020-02-24 DIAGNOSIS — N952 Postmenopausal atrophic vaginitis: Secondary | ICD-10-CM | POA: Diagnosis not present

## 2020-02-24 DIAGNOSIS — G250 Essential tremor: Secondary | ICD-10-CM | POA: Diagnosis not present

## 2020-02-24 DIAGNOSIS — M21962 Unspecified acquired deformity of left lower leg: Secondary | ICD-10-CM | POA: Diagnosis not present

## 2020-02-24 DIAGNOSIS — H409 Unspecified glaucoma: Secondary | ICD-10-CM | POA: Diagnosis not present

## 2020-02-24 DIAGNOSIS — E034 Atrophy of thyroid (acquired): Secondary | ICD-10-CM | POA: Diagnosis not present

## 2020-02-24 DIAGNOSIS — E669 Obesity, unspecified: Secondary | ICD-10-CM | POA: Diagnosis not present

## 2020-02-24 DIAGNOSIS — N3289 Other specified disorders of bladder: Secondary | ICD-10-CM | POA: Diagnosis not present

## 2020-02-24 DIAGNOSIS — C4490 Unspecified malignant neoplasm of skin, unspecified: Secondary | ICD-10-CM | POA: Diagnosis not present

## 2020-02-24 DIAGNOSIS — M25675 Stiffness of left foot, not elsewhere classified: Secondary | ICD-10-CM | POA: Diagnosis not present

## 2020-02-24 DIAGNOSIS — N959 Unspecified menopausal and perimenopausal disorder: Secondary | ICD-10-CM | POA: Diagnosis not present

## 2020-02-24 DIAGNOSIS — I248 Other forms of acute ischemic heart disease: Secondary | ICD-10-CM | POA: Diagnosis not present

## 2020-02-24 DIAGNOSIS — D849 Immunodeficiency, unspecified: Secondary | ICD-10-CM | POA: Diagnosis not present

## 2020-02-24 DIAGNOSIS — Z89612 Acquired absence of left leg above knee: Secondary | ICD-10-CM | POA: Diagnosis not present

## 2020-02-24 DIAGNOSIS — H31002 Unspecified chorioretinal scars, left eye: Secondary | ICD-10-CM | POA: Diagnosis not present

## 2020-02-24 DIAGNOSIS — Z6823 Body mass index (BMI) 23.0-23.9, adult: Secondary | ICD-10-CM | POA: Diagnosis not present

## 2020-02-24 DIAGNOSIS — L710 Perioral dermatitis: Secondary | ICD-10-CM | POA: Diagnosis not present

## 2020-02-24 DIAGNOSIS — R519 Headache, unspecified: Secondary | ICD-10-CM | POA: Diagnosis not present

## 2020-02-24 DIAGNOSIS — B159 Hepatitis A without hepatic coma: Secondary | ICD-10-CM | POA: Diagnosis not present

## 2020-02-24 DIAGNOSIS — M79661 Pain in right lower leg: Secondary | ICD-10-CM | POA: Diagnosis not present

## 2020-02-24 DIAGNOSIS — E876 Hypokalemia: Secondary | ICD-10-CM | POA: Diagnosis not present

## 2020-02-24 DIAGNOSIS — C44629 Squamous cell carcinoma of skin of left upper limb, including shoulder: Secondary | ICD-10-CM | POA: Diagnosis not present

## 2020-02-24 DIAGNOSIS — J690 Pneumonitis due to inhalation of food and vomit: Secondary | ICD-10-CM | POA: Diagnosis not present

## 2020-02-24 DIAGNOSIS — J8410 Pulmonary fibrosis, unspecified: Secondary | ICD-10-CM | POA: Diagnosis not present

## 2020-02-24 DIAGNOSIS — R000 Tachycardia, unspecified: Secondary | ICD-10-CM | POA: Diagnosis not present

## 2020-02-24 DIAGNOSIS — Z6822 Body mass index (BMI) 22.0-22.9, adult: Secondary | ICD-10-CM | POA: Diagnosis not present

## 2020-02-24 DIAGNOSIS — G5762 Lesion of plantar nerve, left lower limb: Secondary | ICD-10-CM | POA: Diagnosis not present

## 2020-02-24 DIAGNOSIS — E663 Overweight: Secondary | ICD-10-CM | POA: Diagnosis not present

## 2020-02-24 DIAGNOSIS — N3001 Acute cystitis with hematuria: Secondary | ICD-10-CM | POA: Diagnosis not present

## 2020-02-24 DIAGNOSIS — I2699 Other pulmonary embolism without acute cor pulmonale: Secondary | ICD-10-CM | POA: Diagnosis not present

## 2020-02-24 DIAGNOSIS — M80032S Age-related osteoporosis with current pathological fracture, left forearm, sequela: Secondary | ICD-10-CM | POA: Diagnosis not present

## 2020-02-24 DIAGNOSIS — H43813 Vitreous degeneration, bilateral: Secondary | ICD-10-CM | POA: Diagnosis not present

## 2020-02-24 DIAGNOSIS — R202 Paresthesia of skin: Secondary | ICD-10-CM | POA: Diagnosis not present

## 2020-02-24 DIAGNOSIS — I4892 Unspecified atrial flutter: Secondary | ICD-10-CM | POA: Diagnosis not present

## 2020-02-24 DIAGNOSIS — I4819 Other persistent atrial fibrillation: Secondary | ICD-10-CM | POA: Diagnosis not present

## 2020-02-24 DIAGNOSIS — M9905 Segmental and somatic dysfunction of pelvic region: Secondary | ICD-10-CM | POA: Diagnosis not present

## 2020-02-24 DIAGNOSIS — Z760 Encounter for issue of repeat prescription: Secondary | ICD-10-CM | POA: Diagnosis not present

## 2020-02-24 DIAGNOSIS — D689 Coagulation defect, unspecified: Secondary | ICD-10-CM | POA: Diagnosis not present

## 2020-02-24 DIAGNOSIS — R293 Abnormal posture: Secondary | ICD-10-CM | POA: Diagnosis not present

## 2020-02-24 DIAGNOSIS — Z905 Acquired absence of kidney: Secondary | ICD-10-CM | POA: Diagnosis not present

## 2020-02-24 DIAGNOSIS — E1139 Type 2 diabetes mellitus with other diabetic ophthalmic complication: Secondary | ICD-10-CM | POA: Diagnosis not present

## 2020-02-24 DIAGNOSIS — R0603 Acute respiratory distress: Secondary | ICD-10-CM | POA: Diagnosis not present

## 2020-02-24 DIAGNOSIS — M24521 Contracture, right elbow: Secondary | ICD-10-CM | POA: Diagnosis not present

## 2020-02-24 DIAGNOSIS — I5032 Chronic diastolic (congestive) heart failure: Secondary | ICD-10-CM | POA: Diagnosis not present

## 2020-02-24 DIAGNOSIS — H6123 Impacted cerumen, bilateral: Secondary | ICD-10-CM | POA: Diagnosis not present

## 2020-02-24 DIAGNOSIS — K861 Other chronic pancreatitis: Secondary | ICD-10-CM | POA: Diagnosis not present

## 2020-02-24 DIAGNOSIS — M25651 Stiffness of right hip, not elsewhere classified: Secondary | ICD-10-CM | POA: Diagnosis not present

## 2020-02-24 DIAGNOSIS — D519 Vitamin B12 deficiency anemia, unspecified: Secondary | ICD-10-CM | POA: Diagnosis not present

## 2020-02-24 DIAGNOSIS — E861 Hypovolemia: Secondary | ICD-10-CM | POA: Diagnosis not present

## 2020-02-24 DIAGNOSIS — T796XXA Traumatic ischemia of muscle, initial encounter: Secondary | ICD-10-CM | POA: Diagnosis not present

## 2020-02-24 DIAGNOSIS — C07 Malignant neoplasm of parotid gland: Secondary | ICD-10-CM | POA: Diagnosis not present

## 2020-02-24 DIAGNOSIS — C9110 Chronic lymphocytic leukemia of B-cell type not having achieved remission: Secondary | ICD-10-CM | POA: Diagnosis not present

## 2020-02-24 DIAGNOSIS — G5782 Other specified mononeuropathies of left lower limb: Secondary | ICD-10-CM | POA: Diagnosis not present

## 2020-02-24 DIAGNOSIS — H01026 Squamous blepharitis left eye, unspecified eyelid: Secondary | ICD-10-CM | POA: Diagnosis not present

## 2020-02-24 DIAGNOSIS — H524 Presbyopia: Secondary | ICD-10-CM | POA: Diagnosis not present

## 2020-02-24 DIAGNOSIS — G4730 Sleep apnea, unspecified: Secondary | ICD-10-CM | POA: Diagnosis not present

## 2020-02-24 DIAGNOSIS — G609 Hereditary and idiopathic neuropathy, unspecified: Secondary | ICD-10-CM | POA: Diagnosis not present

## 2020-02-24 DIAGNOSIS — I8510 Secondary esophageal varices without bleeding: Secondary | ICD-10-CM | POA: Diagnosis not present

## 2020-02-24 DIAGNOSIS — L409 Psoriasis, unspecified: Secondary | ICD-10-CM | POA: Diagnosis not present

## 2020-02-24 DIAGNOSIS — J810 Acute pulmonary edema: Secondary | ICD-10-CM | POA: Diagnosis not present

## 2020-02-24 DIAGNOSIS — S42202D Unspecified fracture of upper end of left humerus, subsequent encounter for fracture with routine healing: Secondary | ICD-10-CM | POA: Diagnosis not present

## 2020-02-24 DIAGNOSIS — D72822 Plasmacytosis: Secondary | ICD-10-CM | POA: Diagnosis not present

## 2020-02-24 DIAGNOSIS — C4491 Basal cell carcinoma of skin, unspecified: Secondary | ICD-10-CM | POA: Diagnosis not present

## 2020-02-24 DIAGNOSIS — Z4822 Encounter for aftercare following kidney transplant: Secondary | ICD-10-CM | POA: Diagnosis not present

## 2020-02-24 DIAGNOSIS — B948 Sequelae of other specified infectious and parasitic diseases: Secondary | ICD-10-CM | POA: Diagnosis not present

## 2020-02-24 DIAGNOSIS — K9289 Other specified diseases of the digestive system: Secondary | ICD-10-CM | POA: Diagnosis not present

## 2020-02-24 DIAGNOSIS — N950 Postmenopausal bleeding: Secondary | ICD-10-CM | POA: Diagnosis not present

## 2020-02-24 DIAGNOSIS — H25813 Combined forms of age-related cataract, bilateral: Secondary | ICD-10-CM | POA: Diagnosis not present

## 2020-02-24 DIAGNOSIS — Z9861 Coronary angioplasty status: Secondary | ICD-10-CM | POA: Diagnosis not present

## 2020-02-24 DIAGNOSIS — M80052D Age-related osteoporosis with current pathological fracture, left femur, subsequent encounter for fracture with routine healing: Secondary | ICD-10-CM | POA: Diagnosis not present

## 2020-02-24 DIAGNOSIS — K648 Other hemorrhoids: Secondary | ICD-10-CM | POA: Diagnosis not present

## 2020-02-24 DIAGNOSIS — E11649 Type 2 diabetes mellitus with hypoglycemia without coma: Secondary | ICD-10-CM | POA: Diagnosis not present

## 2020-02-24 DIAGNOSIS — C9310 Chronic myelomonocytic leukemia not having achieved remission: Secondary | ICD-10-CM | POA: Diagnosis not present

## 2020-02-24 DIAGNOSIS — H47011 Ischemic optic neuropathy, right eye: Secondary | ICD-10-CM | POA: Diagnosis not present

## 2020-02-24 DIAGNOSIS — H4921 Sixth [abducent] nerve palsy, right eye: Secondary | ICD-10-CM | POA: Diagnosis not present

## 2020-02-24 DIAGNOSIS — R7302 Impaired glucose tolerance (oral): Secondary | ICD-10-CM | POA: Diagnosis not present

## 2020-02-24 DIAGNOSIS — F411 Generalized anxiety disorder: Secondary | ICD-10-CM | POA: Diagnosis not present

## 2020-02-24 DIAGNOSIS — R112 Nausea with vomiting, unspecified: Secondary | ICD-10-CM | POA: Diagnosis not present

## 2020-02-24 DIAGNOSIS — E860 Dehydration: Secondary | ICD-10-CM | POA: Diagnosis not present

## 2020-02-24 DIAGNOSIS — R922 Inconclusive mammogram: Secondary | ICD-10-CM | POA: Diagnosis not present

## 2020-02-24 DIAGNOSIS — A6920 Lyme disease, unspecified: Secondary | ICD-10-CM | POA: Diagnosis not present

## 2020-02-24 DIAGNOSIS — D2272 Melanocytic nevi of left lower limb, including hip: Secondary | ICD-10-CM | POA: Diagnosis not present

## 2020-02-24 DIAGNOSIS — M533 Sacrococcygeal disorders, not elsewhere classified: Secondary | ICD-10-CM | POA: Diagnosis not present

## 2020-02-24 DIAGNOSIS — N202 Calculus of kidney with calculus of ureter: Secondary | ICD-10-CM | POA: Diagnosis not present

## 2020-02-24 DIAGNOSIS — M8589 Other specified disorders of bone density and structure, multiple sites: Secondary | ICD-10-CM | POA: Diagnosis not present

## 2020-02-24 DIAGNOSIS — I272 Pulmonary hypertension, unspecified: Secondary | ICD-10-CM | POA: Diagnosis not present

## 2020-02-24 DIAGNOSIS — R29810 Facial weakness: Secondary | ICD-10-CM | POA: Diagnosis not present

## 2020-02-24 DIAGNOSIS — I739 Peripheral vascular disease, unspecified: Secondary | ICD-10-CM | POA: Diagnosis not present

## 2020-02-24 DIAGNOSIS — L601 Onycholysis: Secondary | ICD-10-CM | POA: Diagnosis not present

## 2020-02-24 DIAGNOSIS — R609 Edema, unspecified: Secondary | ICD-10-CM | POA: Diagnosis not present

## 2020-02-24 DIAGNOSIS — A4901 Methicillin susceptible Staphylococcus aureus infection, unspecified site: Secondary | ICD-10-CM | POA: Diagnosis not present

## 2020-02-24 DIAGNOSIS — L739 Follicular disorder, unspecified: Secondary | ICD-10-CM | POA: Diagnosis not present

## 2020-02-24 DIAGNOSIS — M62830 Muscle spasm of back: Secondary | ICD-10-CM | POA: Diagnosis not present

## 2020-02-24 DIAGNOSIS — I059 Rheumatic mitral valve disease, unspecified: Secondary | ICD-10-CM | POA: Diagnosis not present

## 2020-02-24 DIAGNOSIS — S0990XA Unspecified injury of head, initial encounter: Secondary | ICD-10-CM | POA: Diagnosis not present

## 2020-02-24 DIAGNOSIS — H9311 Tinnitus, right ear: Secondary | ICD-10-CM | POA: Diagnosis not present

## 2020-02-24 DIAGNOSIS — G40309 Generalized idiopathic epilepsy and epileptic syndromes, not intractable, without status epilepticus: Secondary | ICD-10-CM | POA: Diagnosis not present

## 2020-02-24 DIAGNOSIS — E039 Hypothyroidism, unspecified: Secondary | ICD-10-CM | POA: Diagnosis not present

## 2020-02-24 DIAGNOSIS — K2101 Gastro-esophageal reflux disease with esophagitis, with bleeding: Secondary | ICD-10-CM | POA: Diagnosis not present

## 2020-02-24 DIAGNOSIS — H2511 Age-related nuclear cataract, right eye: Secondary | ICD-10-CM | POA: Diagnosis not present

## 2020-02-24 DIAGNOSIS — N734 Female chronic pelvic peritonitis: Secondary | ICD-10-CM | POA: Diagnosis not present

## 2020-02-24 DIAGNOSIS — I4821 Permanent atrial fibrillation: Secondary | ICD-10-CM | POA: Diagnosis not present

## 2020-02-24 DIAGNOSIS — N132 Hydronephrosis with renal and ureteral calculous obstruction: Secondary | ICD-10-CM | POA: Diagnosis not present

## 2020-02-24 DIAGNOSIS — R739 Hyperglycemia, unspecified: Secondary | ICD-10-CM | POA: Diagnosis not present

## 2020-02-24 DIAGNOSIS — N3946 Mixed incontinence: Secondary | ICD-10-CM | POA: Diagnosis not present

## 2020-02-24 DIAGNOSIS — M25661 Stiffness of right knee, not elsewhere classified: Secondary | ICD-10-CM | POA: Diagnosis not present

## 2020-02-24 DIAGNOSIS — I25119 Atherosclerotic heart disease of native coronary artery with unspecified angina pectoris: Secondary | ICD-10-CM | POA: Diagnosis not present

## 2020-02-24 DIAGNOSIS — Z1329 Encounter for screening for other suspected endocrine disorder: Secondary | ICD-10-CM | POA: Diagnosis not present

## 2020-02-24 DIAGNOSIS — R0689 Other abnormalities of breathing: Secondary | ICD-10-CM | POA: Diagnosis not present

## 2020-02-24 DIAGNOSIS — S46012A Strain of muscle(s) and tendon(s) of the rotator cuff of left shoulder, initial encounter: Secondary | ICD-10-CM | POA: Diagnosis not present

## 2020-02-24 DIAGNOSIS — H10022 Other mucopurulent conjunctivitis, left eye: Secondary | ICD-10-CM | POA: Diagnosis not present

## 2020-02-24 DIAGNOSIS — I69322 Dysarthria following cerebral infarction: Secondary | ICD-10-CM | POA: Diagnosis not present

## 2020-02-24 DIAGNOSIS — K5903 Drug induced constipation: Secondary | ICD-10-CM | POA: Diagnosis not present

## 2020-02-24 DIAGNOSIS — R0989 Other specified symptoms and signs involving the circulatory and respiratory systems: Secondary | ICD-10-CM | POA: Diagnosis not present

## 2020-02-24 DIAGNOSIS — M164 Bilateral post-traumatic osteoarthritis of hip: Secondary | ICD-10-CM | POA: Diagnosis not present

## 2020-02-24 DIAGNOSIS — I724 Aneurysm of artery of lower extremity: Secondary | ICD-10-CM | POA: Diagnosis not present

## 2020-02-24 DIAGNOSIS — R062 Wheezing: Secondary | ICD-10-CM | POA: Diagnosis not present

## 2020-02-24 DIAGNOSIS — F329 Major depressive disorder, single episode, unspecified: Secondary | ICD-10-CM | POA: Diagnosis not present

## 2020-02-24 DIAGNOSIS — Z1339 Encounter for screening examination for other mental health and behavioral disorders: Secondary | ICD-10-CM | POA: Diagnosis not present

## 2020-02-24 DIAGNOSIS — L928 Other granulomatous disorders of the skin and subcutaneous tissue: Secondary | ICD-10-CM | POA: Diagnosis not present

## 2020-02-24 DIAGNOSIS — M4327 Fusion of spine, lumbosacral region: Secondary | ICD-10-CM | POA: Diagnosis not present

## 2020-02-24 DIAGNOSIS — Z791 Long term (current) use of non-steroidal anti-inflammatories (NSAID): Secondary | ICD-10-CM | POA: Diagnosis not present

## 2020-02-24 DIAGNOSIS — M189 Osteoarthritis of first carpometacarpal joint, unspecified: Secondary | ICD-10-CM | POA: Diagnosis not present

## 2020-02-24 DIAGNOSIS — D472 Monoclonal gammopathy: Secondary | ICD-10-CM | POA: Diagnosis not present

## 2020-02-24 DIAGNOSIS — Z8701 Personal history of pneumonia (recurrent): Secondary | ICD-10-CM | POA: Diagnosis not present

## 2020-02-24 DIAGNOSIS — I110 Hypertensive heart disease with heart failure: Secondary | ICD-10-CM | POA: Diagnosis not present

## 2020-02-24 DIAGNOSIS — M172 Bilateral post-traumatic osteoarthritis of knee: Secondary | ICD-10-CM | POA: Diagnosis not present

## 2020-02-24 DIAGNOSIS — F209 Schizophrenia, unspecified: Secondary | ICD-10-CM | POA: Diagnosis not present

## 2020-02-24 DIAGNOSIS — H9193 Unspecified hearing loss, bilateral: Secondary | ICD-10-CM | POA: Diagnosis not present

## 2020-02-24 DIAGNOSIS — L723 Sebaceous cyst: Secondary | ICD-10-CM | POA: Diagnosis not present

## 2020-02-24 DIAGNOSIS — I63431 Cerebral infarction due to embolism of right posterior cerebral artery: Secondary | ICD-10-CM | POA: Diagnosis not present

## 2020-02-24 DIAGNOSIS — L2089 Other atopic dermatitis: Secondary | ICD-10-CM | POA: Diagnosis not present

## 2020-02-24 DIAGNOSIS — M21961 Unspecified acquired deformity of right lower leg: Secondary | ICD-10-CM | POA: Diagnosis not present

## 2020-02-24 DIAGNOSIS — Z471 Aftercare following joint replacement surgery: Secondary | ICD-10-CM | POA: Diagnosis not present

## 2020-02-24 DIAGNOSIS — C44722 Squamous cell carcinoma of skin of right lower limb, including hip: Secondary | ICD-10-CM | POA: Diagnosis not present

## 2020-02-24 DIAGNOSIS — R921 Mammographic calcification found on diagnostic imaging of breast: Secondary | ICD-10-CM | POA: Diagnosis not present

## 2020-02-24 DIAGNOSIS — H353112 Nonexudative age-related macular degeneration, right eye, intermediate dry stage: Secondary | ICD-10-CM | POA: Diagnosis not present

## 2020-02-24 DIAGNOSIS — M1612 Unilateral primary osteoarthritis, left hip: Secondary | ICD-10-CM | POA: Diagnosis not present

## 2020-02-24 DIAGNOSIS — M170 Bilateral primary osteoarthritis of knee: Secondary | ICD-10-CM | POA: Diagnosis not present

## 2020-02-24 DIAGNOSIS — D62 Acute posthemorrhagic anemia: Secondary | ICD-10-CM | POA: Diagnosis not present

## 2020-02-24 DIAGNOSIS — K2930 Chronic superficial gastritis without bleeding: Secondary | ICD-10-CM | POA: Diagnosis not present

## 2020-02-24 DIAGNOSIS — I69319 Unspecified symptoms and signs involving cognitive functions following cerebral infarction: Secondary | ICD-10-CM | POA: Diagnosis not present

## 2020-02-24 DIAGNOSIS — E1143 Type 2 diabetes mellitus with diabetic autonomic (poly)neuropathy: Secondary | ICD-10-CM | POA: Diagnosis not present

## 2020-02-24 DIAGNOSIS — I341 Nonrheumatic mitral (valve) prolapse: Secondary | ICD-10-CM | POA: Diagnosis not present

## 2020-02-24 DIAGNOSIS — G4489 Other headache syndrome: Secondary | ICD-10-CM | POA: Diagnosis not present

## 2020-02-24 DIAGNOSIS — M25642 Stiffness of left hand, not elsewhere classified: Secondary | ICD-10-CM | POA: Diagnosis not present

## 2020-02-24 DIAGNOSIS — I8310 Varicose veins of unspecified lower extremity with inflammation: Secondary | ICD-10-CM | POA: Diagnosis not present

## 2020-02-24 DIAGNOSIS — G43909 Migraine, unspecified, not intractable, without status migrainosus: Secondary | ICD-10-CM | POA: Diagnosis not present

## 2020-02-24 DIAGNOSIS — Z95828 Presence of other vascular implants and grafts: Secondary | ICD-10-CM | POA: Diagnosis not present

## 2020-02-24 DIAGNOSIS — M25561 Pain in right knee: Secondary | ICD-10-CM | POA: Diagnosis not present

## 2020-02-24 DIAGNOSIS — M25662 Stiffness of left knee, not elsewhere classified: Secondary | ICD-10-CM | POA: Diagnosis not present

## 2020-02-24 DIAGNOSIS — N2581 Secondary hyperparathyroidism of renal origin: Secondary | ICD-10-CM | POA: Diagnosis not present

## 2020-02-24 DIAGNOSIS — M1990 Unspecified osteoarthritis, unspecified site: Secondary | ICD-10-CM | POA: Diagnosis not present

## 2020-02-24 DIAGNOSIS — I120 Hypertensive chronic kidney disease with stage 5 chronic kidney disease or end stage renal disease: Secondary | ICD-10-CM | POA: Diagnosis not present

## 2020-02-24 DIAGNOSIS — M75101 Unspecified rotator cuff tear or rupture of right shoulder, not specified as traumatic: Secondary | ICD-10-CM | POA: Diagnosis not present

## 2020-02-24 DIAGNOSIS — Z832 Family history of diseases of the blood and blood-forming organs and certain disorders involving the immune mechanism: Secondary | ICD-10-CM | POA: Diagnosis not present

## 2020-02-24 DIAGNOSIS — H35033 Hypertensive retinopathy, bilateral: Secondary | ICD-10-CM | POA: Diagnosis not present

## 2020-02-24 DIAGNOSIS — L209 Atopic dermatitis, unspecified: Secondary | ICD-10-CM | POA: Diagnosis not present

## 2020-02-24 DIAGNOSIS — M5090 Cervical disc disorder, unspecified, unspecified cervical region: Secondary | ICD-10-CM | POA: Diagnosis not present

## 2020-02-24 DIAGNOSIS — I5023 Acute on chronic systolic (congestive) heart failure: Secondary | ICD-10-CM | POA: Diagnosis not present

## 2020-02-24 DIAGNOSIS — R58 Hemorrhage, not elsewhere classified: Secondary | ICD-10-CM | POA: Diagnosis not present

## 2020-02-24 DIAGNOSIS — Z6837 Body mass index (BMI) 37.0-37.9, adult: Secondary | ICD-10-CM | POA: Diagnosis not present

## 2020-02-24 DIAGNOSIS — R260 Ataxic gait: Secondary | ICD-10-CM | POA: Diagnosis not present

## 2020-02-24 DIAGNOSIS — Z86718 Personal history of other venous thrombosis and embolism: Secondary | ICD-10-CM | POA: Diagnosis not present

## 2020-02-24 DIAGNOSIS — D72829 Elevated white blood cell count, unspecified: Secondary | ICD-10-CM | POA: Diagnosis not present

## 2020-02-24 DIAGNOSIS — Z4889 Encounter for other specified surgical aftercare: Secondary | ICD-10-CM | POA: Diagnosis not present

## 2020-02-24 DIAGNOSIS — I491 Atrial premature depolarization: Secondary | ICD-10-CM | POA: Diagnosis not present

## 2020-02-24 DIAGNOSIS — E113312 Type 2 diabetes mellitus with moderate nonproliferative diabetic retinopathy with macular edema, left eye: Secondary | ICD-10-CM | POA: Diagnosis not present

## 2020-02-24 DIAGNOSIS — G309 Alzheimer's disease, unspecified: Secondary | ICD-10-CM | POA: Diagnosis not present

## 2020-02-24 DIAGNOSIS — C439 Malignant melanoma of skin, unspecified: Secondary | ICD-10-CM | POA: Diagnosis not present

## 2020-02-24 DIAGNOSIS — R7309 Other abnormal glucose: Secondary | ICD-10-CM | POA: Diagnosis not present

## 2020-02-24 DIAGNOSIS — C186 Malignant neoplasm of descending colon: Secondary | ICD-10-CM | POA: Diagnosis not present

## 2020-02-24 DIAGNOSIS — M62551 Muscle wasting and atrophy, not elsewhere classified, right thigh: Secondary | ICD-10-CM | POA: Diagnosis not present

## 2020-02-24 DIAGNOSIS — H25093 Other age-related incipient cataract, bilateral: Secondary | ICD-10-CM | POA: Diagnosis not present

## 2020-02-24 DIAGNOSIS — W19XXXA Unspecified fall, initial encounter: Secondary | ICD-10-CM | POA: Diagnosis not present

## 2020-02-24 DIAGNOSIS — C651 Malignant neoplasm of right renal pelvis: Secondary | ICD-10-CM | POA: Diagnosis not present

## 2020-02-24 DIAGNOSIS — M5137 Other intervertebral disc degeneration, lumbosacral region: Secondary | ICD-10-CM | POA: Diagnosis not present

## 2020-02-24 DIAGNOSIS — F0281 Dementia in other diseases classified elsewhere with behavioral disturbance: Secondary | ICD-10-CM | POA: Diagnosis not present

## 2020-02-24 DIAGNOSIS — F0150 Vascular dementia without behavioral disturbance: Secondary | ICD-10-CM | POA: Diagnosis not present

## 2020-02-24 DIAGNOSIS — E782 Mixed hyperlipidemia: Secondary | ICD-10-CM | POA: Diagnosis not present

## 2020-02-24 DIAGNOSIS — B078 Other viral warts: Secondary | ICD-10-CM | POA: Diagnosis not present

## 2020-02-24 DIAGNOSIS — R778 Other specified abnormalities of plasma proteins: Secondary | ICD-10-CM | POA: Diagnosis not present

## 2020-02-24 DIAGNOSIS — M5441 Lumbago with sciatica, right side: Secondary | ICD-10-CM | POA: Diagnosis not present

## 2020-02-24 DIAGNOSIS — M75122 Complete rotator cuff tear or rupture of left shoulder, not specified as traumatic: Secondary | ICD-10-CM | POA: Diagnosis not present

## 2020-02-24 DIAGNOSIS — I25110 Atherosclerotic heart disease of native coronary artery with unstable angina pectoris: Secondary | ICD-10-CM | POA: Diagnosis not present

## 2020-02-24 DIAGNOSIS — M12811 Other specific arthropathies, not elsewhere classified, right shoulder: Secondary | ICD-10-CM | POA: Diagnosis not present

## 2020-02-24 DIAGNOSIS — H25811 Combined forms of age-related cataract, right eye: Secondary | ICD-10-CM | POA: Diagnosis not present

## 2020-02-24 DIAGNOSIS — R491 Aphonia: Secondary | ICD-10-CM | POA: Diagnosis not present

## 2020-02-24 DIAGNOSIS — R1031 Right lower quadrant pain: Secondary | ICD-10-CM | POA: Diagnosis not present

## 2020-02-24 DIAGNOSIS — C3431 Malignant neoplasm of lower lobe, right bronchus or lung: Secondary | ICD-10-CM | POA: Diagnosis not present

## 2020-02-24 DIAGNOSIS — S60012A Contusion of left thumb without damage to nail, initial encounter: Secondary | ICD-10-CM | POA: Diagnosis not present

## 2020-02-24 DIAGNOSIS — G479 Sleep disorder, unspecified: Secondary | ICD-10-CM | POA: Diagnosis not present

## 2020-02-24 DIAGNOSIS — R9389 Abnormal findings on diagnostic imaging of other specified body structures: Secondary | ICD-10-CM | POA: Diagnosis not present

## 2020-02-24 DIAGNOSIS — F0391 Unspecified dementia with behavioral disturbance: Secondary | ICD-10-CM | POA: Diagnosis not present

## 2020-02-24 DIAGNOSIS — M19031 Primary osteoarthritis, right wrist: Secondary | ICD-10-CM | POA: Diagnosis not present

## 2020-02-24 DIAGNOSIS — J1282 Pneumonia due to coronavirus disease 2019: Secondary | ICD-10-CM | POA: Diagnosis not present

## 2020-02-24 DIAGNOSIS — N201 Calculus of ureter: Secondary | ICD-10-CM | POA: Diagnosis not present

## 2020-02-24 DIAGNOSIS — K649 Unspecified hemorrhoids: Secondary | ICD-10-CM | POA: Diagnosis not present

## 2020-02-24 DIAGNOSIS — D696 Thrombocytopenia, unspecified: Secondary | ICD-10-CM | POA: Diagnosis not present

## 2020-02-24 DIAGNOSIS — T783XXD Angioneurotic edema, subsequent encounter: Secondary | ICD-10-CM | POA: Diagnosis not present

## 2020-02-24 DIAGNOSIS — C787 Secondary malignant neoplasm of liver and intrahepatic bile duct: Secondary | ICD-10-CM | POA: Diagnosis not present

## 2020-02-24 DIAGNOSIS — B029 Zoster without complications: Secondary | ICD-10-CM | POA: Diagnosis not present

## 2020-02-24 DIAGNOSIS — M7671 Peroneal tendinitis, right leg: Secondary | ICD-10-CM | POA: Diagnosis not present

## 2020-02-24 DIAGNOSIS — Z8639 Personal history of other endocrine, nutritional and metabolic disease: Secondary | ICD-10-CM | POA: Diagnosis not present

## 2020-02-24 DIAGNOSIS — L97521 Non-pressure chronic ulcer of other part of left foot limited to breakdown of skin: Secondary | ICD-10-CM | POA: Diagnosis not present

## 2020-02-24 DIAGNOSIS — Z1231 Encounter for screening mammogram for malignant neoplasm of breast: Secondary | ICD-10-CM | POA: Diagnosis not present

## 2020-02-24 DIAGNOSIS — I700 Atherosclerosis of aorta: Secondary | ICD-10-CM | POA: Diagnosis not present

## 2020-02-24 DIAGNOSIS — Z01812 Encounter for preprocedural laboratory examination: Secondary | ICD-10-CM | POA: Diagnosis not present

## 2020-02-24 DIAGNOSIS — G35 Multiple sclerosis: Secondary | ICD-10-CM | POA: Diagnosis not present

## 2020-02-24 DIAGNOSIS — I130 Hypertensive heart and chronic kidney disease with heart failure and stage 1 through stage 4 chronic kidney disease, or unspecified chronic kidney disease: Secondary | ICD-10-CM | POA: Diagnosis not present

## 2020-02-24 DIAGNOSIS — A084 Viral intestinal infection, unspecified: Secondary | ICD-10-CM | POA: Diagnosis not present

## 2020-02-24 DIAGNOSIS — R358 Other polyuria: Secondary | ICD-10-CM | POA: Diagnosis not present

## 2020-02-24 DIAGNOSIS — C187 Malignant neoplasm of sigmoid colon: Secondary | ICD-10-CM | POA: Diagnosis not present

## 2020-02-24 DIAGNOSIS — M12812 Other specific arthropathies, not elsewhere classified, left shoulder: Secondary | ICD-10-CM | POA: Diagnosis not present

## 2020-02-24 DIAGNOSIS — K254 Chronic or unspecified gastric ulcer with hemorrhage: Secondary | ICD-10-CM | POA: Diagnosis not present

## 2020-02-24 DIAGNOSIS — R404 Transient alteration of awareness: Secondary | ICD-10-CM | POA: Diagnosis not present

## 2020-02-24 DIAGNOSIS — L309 Dermatitis, unspecified: Secondary | ICD-10-CM | POA: Diagnosis not present

## 2020-02-24 DIAGNOSIS — M7751 Other enthesopathy of right foot: Secondary | ICD-10-CM | POA: Diagnosis not present

## 2020-02-24 DIAGNOSIS — L97929 Non-pressure chronic ulcer of unspecified part of left lower leg with unspecified severity: Secondary | ICD-10-CM | POA: Diagnosis not present

## 2020-02-24 DIAGNOSIS — Q675 Congenital deformity of spine: Secondary | ICD-10-CM | POA: Diagnosis not present

## 2020-02-24 DIAGNOSIS — L89893 Pressure ulcer of other site, stage 3: Secondary | ICD-10-CM | POA: Diagnosis not present

## 2020-02-24 DIAGNOSIS — L650 Telogen effluvium: Secondary | ICD-10-CM | POA: Diagnosis not present

## 2020-02-24 DIAGNOSIS — D7589 Other specified diseases of blood and blood-forming organs: Secondary | ICD-10-CM | POA: Diagnosis not present

## 2020-02-24 DIAGNOSIS — K760 Fatty (change of) liver, not elsewhere classified: Secondary | ICD-10-CM | POA: Diagnosis not present

## 2020-02-24 DIAGNOSIS — R339 Retention of urine, unspecified: Secondary | ICD-10-CM | POA: Diagnosis not present

## 2020-02-24 DIAGNOSIS — M19072 Primary osteoarthritis, left ankle and foot: Secondary | ICD-10-CM | POA: Diagnosis not present

## 2020-02-24 DIAGNOSIS — I8311 Varicose veins of right lower extremity with inflammation: Secondary | ICD-10-CM | POA: Diagnosis not present

## 2020-02-24 DIAGNOSIS — H6120 Impacted cerumen, unspecified ear: Secondary | ICD-10-CM | POA: Diagnosis not present

## 2020-02-24 DIAGNOSIS — M19071 Primary osteoarthritis, right ankle and foot: Secondary | ICD-10-CM | POA: Diagnosis not present

## 2020-02-24 DIAGNOSIS — I151 Hypertension secondary to other renal disorders: Secondary | ICD-10-CM | POA: Diagnosis not present

## 2020-02-24 DIAGNOSIS — B370 Candidal stomatitis: Secondary | ICD-10-CM | POA: Diagnosis not present

## 2020-02-24 DIAGNOSIS — T887XXA Unspecified adverse effect of drug or medicament, initial encounter: Secondary | ICD-10-CM | POA: Diagnosis not present

## 2020-02-24 DIAGNOSIS — K5730 Diverticulosis of large intestine without perforation or abscess without bleeding: Secondary | ICD-10-CM | POA: Diagnosis not present

## 2020-02-24 DIAGNOSIS — Z733 Stress, not elsewhere classified: Secondary | ICD-10-CM | POA: Diagnosis not present

## 2020-02-24 DIAGNOSIS — F0151 Vascular dementia with behavioral disturbance: Secondary | ICD-10-CM | POA: Diagnosis not present

## 2020-02-24 DIAGNOSIS — N958 Other specified menopausal and perimenopausal disorders: Secondary | ICD-10-CM | POA: Diagnosis not present

## 2020-02-24 DIAGNOSIS — N3000 Acute cystitis without hematuria: Secondary | ICD-10-CM | POA: Diagnosis not present

## 2020-02-24 DIAGNOSIS — Z820 Family history of epilepsy and other diseases of the nervous system: Secondary | ICD-10-CM | POA: Diagnosis not present

## 2020-02-24 DIAGNOSIS — I2692 Saddle embolus of pulmonary artery without acute cor pulmonale: Secondary | ICD-10-CM | POA: Diagnosis not present

## 2020-02-24 DIAGNOSIS — M069 Rheumatoid arthritis, unspecified: Secondary | ICD-10-CM | POA: Diagnosis not present

## 2020-02-24 DIAGNOSIS — M25531 Pain in right wrist: Secondary | ICD-10-CM | POA: Diagnosis not present

## 2020-02-24 DIAGNOSIS — Z01818 Encounter for other preprocedural examination: Secondary | ICD-10-CM | POA: Diagnosis not present

## 2020-02-24 DIAGNOSIS — Q211 Atrial septal defect: Secondary | ICD-10-CM | POA: Diagnosis not present

## 2020-02-24 DIAGNOSIS — M353 Polymyalgia rheumatica: Secondary | ICD-10-CM | POA: Diagnosis not present

## 2020-02-24 DIAGNOSIS — M47899 Other spondylosis, site unspecified: Secondary | ICD-10-CM | POA: Diagnosis not present

## 2020-02-24 DIAGNOSIS — R350 Frequency of micturition: Secondary | ICD-10-CM | POA: Diagnosis not present

## 2020-02-24 DIAGNOSIS — E042 Nontoxic multinodular goiter: Secondary | ICD-10-CM | POA: Diagnosis not present

## 2020-02-24 DIAGNOSIS — N136 Pyonephrosis: Secondary | ICD-10-CM | POA: Diagnosis not present

## 2020-02-24 DIAGNOSIS — M222X2 Patellofemoral disorders, left knee: Secondary | ICD-10-CM | POA: Diagnosis not present

## 2020-02-24 DIAGNOSIS — I69392 Facial weakness following cerebral infarction: Secondary | ICD-10-CM | POA: Diagnosis not present

## 2020-02-24 DIAGNOSIS — D123 Benign neoplasm of transverse colon: Secondary | ICD-10-CM | POA: Diagnosis not present

## 2020-02-24 DIAGNOSIS — K8020 Calculus of gallbladder without cholecystitis without obstruction: Secondary | ICD-10-CM | POA: Diagnosis not present

## 2020-02-24 DIAGNOSIS — H9319 Tinnitus, unspecified ear: Secondary | ICD-10-CM | POA: Diagnosis not present

## 2020-02-24 DIAGNOSIS — J9601 Acute respiratory failure with hypoxia: Secondary | ICD-10-CM | POA: Diagnosis not present

## 2020-02-24 DIAGNOSIS — J309 Allergic rhinitis, unspecified: Secondary | ICD-10-CM | POA: Diagnosis not present

## 2020-02-24 DIAGNOSIS — H16223 Keratoconjunctivitis sicca, not specified as Sjogren's, bilateral: Secondary | ICD-10-CM | POA: Diagnosis not present

## 2020-02-24 DIAGNOSIS — M19011 Primary osteoarthritis, right shoulder: Secondary | ICD-10-CM | POA: Diagnosis not present

## 2020-02-24 DIAGNOSIS — H9192 Unspecified hearing loss, left ear: Secondary | ICD-10-CM | POA: Diagnosis not present

## 2020-02-24 DIAGNOSIS — R300 Dysuria: Secondary | ICD-10-CM | POA: Diagnosis not present

## 2020-02-24 DIAGNOSIS — Z825 Family history of asthma and other chronic lower respiratory diseases: Secondary | ICD-10-CM | POA: Diagnosis not present

## 2020-02-24 DIAGNOSIS — K644 Residual hemorrhoidal skin tags: Secondary | ICD-10-CM | POA: Diagnosis not present

## 2020-02-24 DIAGNOSIS — J3089 Other allergic rhinitis: Secondary | ICD-10-CM | POA: Diagnosis not present

## 2020-02-24 DIAGNOSIS — Z0130 Encounter for examination of blood pressure without abnormal findings: Secondary | ICD-10-CM | POA: Diagnosis not present

## 2020-02-24 DIAGNOSIS — I2782 Chronic pulmonary embolism: Secondary | ICD-10-CM | POA: Diagnosis not present

## 2020-02-24 DIAGNOSIS — N210 Calculus in bladder: Secondary | ICD-10-CM | POA: Diagnosis not present

## 2020-02-24 DIAGNOSIS — C689 Malignant neoplasm of urinary organ, unspecified: Secondary | ICD-10-CM | POA: Diagnosis not present

## 2020-02-24 DIAGNOSIS — H401432 Capsular glaucoma with pseudoexfoliation of lens, bilateral, moderate stage: Secondary | ICD-10-CM | POA: Diagnosis not present

## 2020-02-24 DIAGNOSIS — L97812 Non-pressure chronic ulcer of other part of right lower leg with fat layer exposed: Secondary | ICD-10-CM | POA: Diagnosis not present

## 2020-02-24 DIAGNOSIS — J9383 Other pneumothorax: Secondary | ICD-10-CM | POA: Diagnosis not present

## 2020-02-24 DIAGNOSIS — M130 Polyarthritis, unspecified: Secondary | ICD-10-CM | POA: Diagnosis not present

## 2020-02-24 DIAGNOSIS — F39 Unspecified mood [affective] disorder: Secondary | ICD-10-CM | POA: Diagnosis not present

## 2020-02-24 DIAGNOSIS — Z90710 Acquired absence of both cervix and uterus: Secondary | ICD-10-CM | POA: Diagnosis not present

## 2020-02-24 DIAGNOSIS — J849 Interstitial pulmonary disease, unspecified: Secondary | ICD-10-CM | POA: Diagnosis not present

## 2020-02-24 DIAGNOSIS — G8190 Hemiplegia, unspecified affecting unspecified side: Secondary | ICD-10-CM | POA: Diagnosis not present

## 2020-02-24 DIAGNOSIS — I779 Disorder of arteries and arterioles, unspecified: Secondary | ICD-10-CM | POA: Diagnosis not present

## 2020-02-24 DIAGNOSIS — J432 Centrilobular emphysema: Secondary | ICD-10-CM | POA: Diagnosis not present

## 2020-02-24 DIAGNOSIS — I519 Heart disease, unspecified: Secondary | ICD-10-CM | POA: Diagnosis not present

## 2020-02-24 DIAGNOSIS — M4802 Spinal stenosis, cervical region: Secondary | ICD-10-CM | POA: Diagnosis not present

## 2020-02-24 DIAGNOSIS — I63511 Cerebral infarction due to unspecified occlusion or stenosis of right middle cerebral artery: Secondary | ICD-10-CM | POA: Diagnosis not present

## 2020-02-24 DIAGNOSIS — M546 Pain in thoracic spine: Secondary | ICD-10-CM | POA: Diagnosis not present

## 2020-02-24 DIAGNOSIS — M531 Cervicobrachial syndrome: Secondary | ICD-10-CM | POA: Diagnosis not present

## 2020-02-24 DIAGNOSIS — T3695XA Adverse effect of unspecified systemic antibiotic, initial encounter: Secondary | ICD-10-CM | POA: Diagnosis not present

## 2020-02-24 DIAGNOSIS — R251 Tremor, unspecified: Secondary | ICD-10-CM | POA: Diagnosis not present

## 2020-02-24 DIAGNOSIS — H10413 Chronic giant papillary conjunctivitis, bilateral: Secondary | ICD-10-CM | POA: Diagnosis not present

## 2020-02-24 DIAGNOSIS — F1020 Alcohol dependence, uncomplicated: Secondary | ICD-10-CM | POA: Diagnosis not present

## 2020-02-24 DIAGNOSIS — Z7952 Long term (current) use of systemic steroids: Secondary | ICD-10-CM | POA: Diagnosis not present

## 2020-02-24 DIAGNOSIS — H5213 Myopia, bilateral: Secondary | ICD-10-CM | POA: Diagnosis not present

## 2020-02-24 DIAGNOSIS — E7801 Familial hypercholesterolemia: Secondary | ICD-10-CM | POA: Diagnosis not present

## 2020-02-24 DIAGNOSIS — M532X7 Spinal instabilities, lumbosacral region: Secondary | ICD-10-CM | POA: Diagnosis not present

## 2020-02-24 DIAGNOSIS — D692 Other nonthrombocytopenic purpura: Secondary | ICD-10-CM | POA: Diagnosis not present

## 2020-02-24 DIAGNOSIS — S3992XA Unspecified injury of lower back, initial encounter: Secondary | ICD-10-CM | POA: Diagnosis not present

## 2020-02-24 DIAGNOSIS — J329 Chronic sinusitis, unspecified: Secondary | ICD-10-CM | POA: Diagnosis not present

## 2020-02-24 DIAGNOSIS — E1149 Type 2 diabetes mellitus with other diabetic neurological complication: Secondary | ICD-10-CM | POA: Diagnosis not present

## 2020-02-24 DIAGNOSIS — R258 Other abnormal involuntary movements: Secondary | ICD-10-CM | POA: Diagnosis not present

## 2020-02-24 DIAGNOSIS — R1012 Left upper quadrant pain: Secondary | ICD-10-CM | POA: Diagnosis not present

## 2020-02-24 DIAGNOSIS — C44221 Squamous cell carcinoma of skin of unspecified ear and external auricular canal: Secondary | ICD-10-CM | POA: Diagnosis not present

## 2020-02-24 DIAGNOSIS — M2041 Other hammer toe(s) (acquired), right foot: Secondary | ICD-10-CM | POA: Diagnosis not present

## 2020-02-24 DIAGNOSIS — M9903 Segmental and somatic dysfunction of lumbar region: Secondary | ICD-10-CM | POA: Diagnosis not present

## 2020-02-24 DIAGNOSIS — H25812 Combined forms of age-related cataract, left eye: Secondary | ICD-10-CM | POA: Diagnosis not present

## 2020-02-24 DIAGNOSIS — S22009A Unspecified fracture of unspecified thoracic vertebra, initial encounter for closed fracture: Secondary | ICD-10-CM | POA: Diagnosis not present

## 2020-02-24 DIAGNOSIS — Z8679 Personal history of other diseases of the circulatory system: Secondary | ICD-10-CM | POA: Diagnosis not present

## 2020-02-24 DIAGNOSIS — J301 Allergic rhinitis due to pollen: Secondary | ICD-10-CM | POA: Diagnosis not present

## 2020-02-24 DIAGNOSIS — M65331 Trigger finger, right middle finger: Secondary | ICD-10-CM | POA: Diagnosis not present

## 2020-02-24 DIAGNOSIS — Z5112 Encounter for antineoplastic immunotherapy: Secondary | ICD-10-CM | POA: Diagnosis not present

## 2020-02-24 DIAGNOSIS — Z86007 Personal history of in-situ neoplasm of skin: Secondary | ICD-10-CM | POA: Diagnosis not present

## 2020-02-24 DIAGNOSIS — R4182 Altered mental status, unspecified: Secondary | ICD-10-CM | POA: Diagnosis not present

## 2020-02-24 DIAGNOSIS — R748 Abnormal levels of other serum enzymes: Secondary | ICD-10-CM | POA: Diagnosis not present

## 2020-02-24 DIAGNOSIS — Z792 Long term (current) use of antibiotics: Secondary | ICD-10-CM | POA: Diagnosis not present

## 2020-02-24 DIAGNOSIS — H179 Unspecified corneal scar and opacity: Secondary | ICD-10-CM | POA: Diagnosis not present

## 2020-02-24 DIAGNOSIS — R414 Neurologic neglect syndrome: Secondary | ICD-10-CM | POA: Diagnosis not present

## 2020-02-24 DIAGNOSIS — M4624 Osteomyelitis of vertebra, thoracic region: Secondary | ICD-10-CM | POA: Diagnosis not present

## 2020-02-24 DIAGNOSIS — M6281 Muscle weakness (generalized): Secondary | ICD-10-CM | POA: Diagnosis not present

## 2020-02-24 DIAGNOSIS — R2240 Localized swelling, mass and lump, unspecified lower limb: Secondary | ICD-10-CM | POA: Diagnosis not present

## 2020-02-24 DIAGNOSIS — S86002D Unspecified injury of left Achilles tendon, subsequent encounter: Secondary | ICD-10-CM | POA: Diagnosis not present

## 2020-02-24 DIAGNOSIS — C329 Malignant neoplasm of larynx, unspecified: Secondary | ICD-10-CM | POA: Diagnosis not present

## 2020-02-24 DIAGNOSIS — C44329 Squamous cell carcinoma of skin of other parts of face: Secondary | ICD-10-CM | POA: Diagnosis not present

## 2020-02-24 DIAGNOSIS — R945 Abnormal results of liver function studies: Secondary | ICD-10-CM | POA: Diagnosis not present

## 2020-02-24 DIAGNOSIS — Z6820 Body mass index (BMI) 20.0-20.9, adult: Secondary | ICD-10-CM | POA: Diagnosis not present

## 2020-02-24 DIAGNOSIS — L578 Other skin changes due to chronic exposure to nonionizing radiation: Secondary | ICD-10-CM | POA: Diagnosis not present

## 2020-02-24 DIAGNOSIS — I340 Nonrheumatic mitral (valve) insufficiency: Secondary | ICD-10-CM | POA: Diagnosis not present

## 2020-02-24 DIAGNOSIS — I6340 Cerebral infarction due to embolism of unspecified cerebral artery: Secondary | ICD-10-CM | POA: Diagnosis not present

## 2020-02-24 DIAGNOSIS — G894 Chronic pain syndrome: Secondary | ICD-10-CM | POA: Diagnosis not present

## 2020-02-24 DIAGNOSIS — K7011 Alcoholic hepatitis with ascites: Secondary | ICD-10-CM | POA: Diagnosis not present

## 2020-02-24 DIAGNOSIS — D631 Anemia in chronic kidney disease: Secondary | ICD-10-CM | POA: Diagnosis not present

## 2020-02-24 DIAGNOSIS — M25461 Effusion, right knee: Secondary | ICD-10-CM | POA: Diagnosis not present

## 2020-02-24 DIAGNOSIS — M150 Primary generalized (osteo)arthritis: Secondary | ICD-10-CM | POA: Diagnosis not present

## 2020-02-24 DIAGNOSIS — R071 Chest pain on breathing: Secondary | ICD-10-CM | POA: Diagnosis not present

## 2020-02-24 DIAGNOSIS — M2550 Pain in unspecified joint: Secondary | ICD-10-CM | POA: Diagnosis not present

## 2020-02-24 DIAGNOSIS — G20 Parkinson's disease: Secondary | ICD-10-CM | POA: Diagnosis not present

## 2020-02-24 DIAGNOSIS — F3342 Major depressive disorder, recurrent, in full remission: Secondary | ICD-10-CM | POA: Diagnosis not present

## 2020-02-24 DIAGNOSIS — J4550 Severe persistent asthma, uncomplicated: Secondary | ICD-10-CM | POA: Diagnosis not present

## 2020-02-24 DIAGNOSIS — R7303 Prediabetes: Secondary | ICD-10-CM | POA: Diagnosis not present

## 2020-02-24 DIAGNOSIS — Z510 Encounter for antineoplastic radiation therapy: Secondary | ICD-10-CM | POA: Diagnosis not present

## 2020-02-24 DIAGNOSIS — L089 Local infection of the skin and subcutaneous tissue, unspecified: Secondary | ICD-10-CM | POA: Diagnosis not present

## 2020-02-24 DIAGNOSIS — L308 Other specified dermatitis: Secondary | ICD-10-CM | POA: Diagnosis not present

## 2020-02-24 DIAGNOSIS — S82401A Unspecified fracture of shaft of right fibula, initial encounter for closed fracture: Secondary | ICD-10-CM | POA: Diagnosis not present

## 2020-02-24 DIAGNOSIS — B001 Herpesviral vesicular dermatitis: Secondary | ICD-10-CM | POA: Diagnosis not present

## 2020-02-24 DIAGNOSIS — H35373 Puckering of macula, bilateral: Secondary | ICD-10-CM | POA: Diagnosis not present

## 2020-02-24 DIAGNOSIS — D2271 Melanocytic nevi of right lower limb, including hip: Secondary | ICD-10-CM | POA: Diagnosis not present

## 2020-02-24 DIAGNOSIS — M79642 Pain in left hand: Secondary | ICD-10-CM | POA: Diagnosis not present

## 2020-02-24 DIAGNOSIS — J310 Chronic rhinitis: Secondary | ICD-10-CM | POA: Diagnosis not present

## 2020-02-24 DIAGNOSIS — Z882 Allergy status to sulfonamides status: Secondary | ICD-10-CM | POA: Diagnosis not present

## 2020-02-24 DIAGNOSIS — Z6838 Body mass index (BMI) 38.0-38.9, adult: Secondary | ICD-10-CM | POA: Diagnosis not present

## 2020-02-24 DIAGNOSIS — R319 Hematuria, unspecified: Secondary | ICD-10-CM | POA: Diagnosis not present

## 2020-02-24 DIAGNOSIS — F0390 Unspecified dementia without behavioral disturbance: Secondary | ICD-10-CM | POA: Diagnosis not present

## 2020-02-24 DIAGNOSIS — D2261 Melanocytic nevi of right upper limb, including shoulder: Secondary | ICD-10-CM | POA: Diagnosis not present

## 2020-02-24 DIAGNOSIS — M7062 Trochanteric bursitis, left hip: Secondary | ICD-10-CM | POA: Diagnosis not present

## 2020-02-24 DIAGNOSIS — N481 Balanitis: Secondary | ICD-10-CM | POA: Diagnosis not present

## 2020-02-24 DIAGNOSIS — L0390 Cellulitis, unspecified: Secondary | ICD-10-CM | POA: Diagnosis not present

## 2020-02-24 DIAGNOSIS — B964 Proteus (mirabilis) (morganii) as the cause of diseases classified elsewhere: Secondary | ICD-10-CM | POA: Diagnosis not present

## 2020-02-24 DIAGNOSIS — Z86006 Personal history of melanoma in-situ: Secondary | ICD-10-CM | POA: Diagnosis not present

## 2020-02-24 DIAGNOSIS — Z7409 Other reduced mobility: Secondary | ICD-10-CM | POA: Diagnosis not present

## 2020-02-24 DIAGNOSIS — J9602 Acute respiratory failure with hypercapnia: Secondary | ICD-10-CM | POA: Diagnosis not present

## 2020-02-24 DIAGNOSIS — C3412 Malignant neoplasm of upper lobe, left bronchus or lung: Secondary | ICD-10-CM | POA: Diagnosis not present

## 2020-02-24 DIAGNOSIS — N3020 Other chronic cystitis without hematuria: Secondary | ICD-10-CM | POA: Diagnosis not present

## 2020-02-24 DIAGNOSIS — L987 Excessive and redundant skin and subcutaneous tissue: Secondary | ICD-10-CM | POA: Diagnosis not present

## 2020-02-24 DIAGNOSIS — F909 Attention-deficit hyperactivity disorder, unspecified type: Secondary | ICD-10-CM | POA: Diagnosis not present

## 2020-02-24 DIAGNOSIS — I83813 Varicose veins of bilateral lower extremities with pain: Secondary | ICD-10-CM | POA: Diagnosis not present

## 2020-02-24 DIAGNOSIS — M65311 Trigger thumb, right thumb: Secondary | ICD-10-CM | POA: Diagnosis not present

## 2020-02-24 DIAGNOSIS — I358 Other nonrheumatic aortic valve disorders: Secondary | ICD-10-CM | POA: Diagnosis not present

## 2020-02-24 DIAGNOSIS — M47892 Other spondylosis, cervical region: Secondary | ICD-10-CM | POA: Diagnosis not present

## 2020-02-24 DIAGNOSIS — H02834 Dermatochalasis of left upper eyelid: Secondary | ICD-10-CM | POA: Diagnosis not present

## 2020-02-24 DIAGNOSIS — M7061 Trochanteric bursitis, right hip: Secondary | ICD-10-CM | POA: Diagnosis not present

## 2020-02-24 DIAGNOSIS — J3081 Allergic rhinitis due to animal (cat) (dog) hair and dander: Secondary | ICD-10-CM | POA: Diagnosis not present

## 2020-02-24 DIAGNOSIS — N5201 Erectile dysfunction due to arterial insufficiency: Secondary | ICD-10-CM | POA: Diagnosis not present

## 2020-02-24 DIAGNOSIS — K2100 Gastro-esophageal reflux disease with esophagitis, without bleeding: Secondary | ICD-10-CM | POA: Diagnosis not present

## 2020-02-24 DIAGNOSIS — L97511 Non-pressure chronic ulcer of other part of right foot limited to breakdown of skin: Secondary | ICD-10-CM | POA: Diagnosis not present

## 2020-02-24 DIAGNOSIS — S6981XA Other specified injuries of right wrist, hand and finger(s), initial encounter: Secondary | ICD-10-CM | POA: Diagnosis not present

## 2020-02-24 DIAGNOSIS — Z853 Personal history of malignant neoplasm of breast: Secondary | ICD-10-CM | POA: Diagnosis not present

## 2020-02-24 DIAGNOSIS — M1A4110 Other secondary chronic gout, right shoulder, without tophus (tophi): Secondary | ICD-10-CM | POA: Diagnosis not present

## 2020-02-24 DIAGNOSIS — M79672 Pain in left foot: Secondary | ICD-10-CM | POA: Diagnosis not present

## 2020-02-24 DIAGNOSIS — M722 Plantar fascial fibromatosis: Secondary | ICD-10-CM | POA: Diagnosis not present

## 2020-02-24 DIAGNOSIS — Z4682 Encounter for fitting and adjustment of non-vascular catheter: Secondary | ICD-10-CM | POA: Diagnosis not present

## 2020-02-24 DIAGNOSIS — I951 Orthostatic hypotension: Secondary | ICD-10-CM | POA: Diagnosis not present

## 2020-02-24 DIAGNOSIS — Y92009 Unspecified place in unspecified non-institutional (private) residence as the place of occurrence of the external cause: Secondary | ICD-10-CM | POA: Diagnosis not present

## 2020-02-24 DIAGNOSIS — Z930 Tracheostomy status: Secondary | ICD-10-CM | POA: Diagnosis not present

## 2020-02-24 DIAGNOSIS — M7521 Bicipital tendinitis, right shoulder: Secondary | ICD-10-CM | POA: Diagnosis not present

## 2020-02-24 DIAGNOSIS — I5042 Chronic combined systolic (congestive) and diastolic (congestive) heart failure: Secondary | ICD-10-CM | POA: Diagnosis not present

## 2020-02-24 DIAGNOSIS — H59039 Cystoid macular edema following cataract surgery, unspecified eye: Secondary | ICD-10-CM | POA: Diagnosis not present

## 2020-02-24 DIAGNOSIS — D2262 Melanocytic nevi of left upper limb, including shoulder: Secondary | ICD-10-CM | POA: Diagnosis not present

## 2020-02-24 DIAGNOSIS — R931 Abnormal findings on diagnostic imaging of heart and coronary circulation: Secondary | ICD-10-CM | POA: Diagnosis not present

## 2020-02-24 DIAGNOSIS — G893 Neoplasm related pain (acute) (chronic): Secondary | ICD-10-CM | POA: Diagnosis not present

## 2020-02-24 DIAGNOSIS — M4010 Other secondary kyphosis, site unspecified: Secondary | ICD-10-CM | POA: Diagnosis not present

## 2020-02-24 DIAGNOSIS — M6702 Short Achilles tendon (acquired), left ankle: Secondary | ICD-10-CM | POA: Diagnosis not present

## 2020-02-24 DIAGNOSIS — J029 Acute pharyngitis, unspecified: Secondary | ICD-10-CM | POA: Diagnosis not present

## 2020-02-24 DIAGNOSIS — M25521 Pain in right elbow: Secondary | ICD-10-CM | POA: Diagnosis not present

## 2020-02-24 DIAGNOSIS — M5414 Radiculopathy, thoracic region: Secondary | ICD-10-CM | POA: Diagnosis not present

## 2020-02-24 DIAGNOSIS — M25562 Pain in left knee: Secondary | ICD-10-CM | POA: Diagnosis not present

## 2020-02-24 DIAGNOSIS — Z951 Presence of aortocoronary bypass graft: Secondary | ICD-10-CM | POA: Diagnosis not present

## 2020-02-24 DIAGNOSIS — M25452 Effusion, left hip: Secondary | ICD-10-CM | POA: Diagnosis not present

## 2020-02-24 DIAGNOSIS — Z9189 Other specified personal risk factors, not elsewhere classified: Secondary | ICD-10-CM | POA: Diagnosis not present

## 2020-02-24 DIAGNOSIS — Z8546 Personal history of malignant neoplasm of prostate: Secondary | ICD-10-CM | POA: Diagnosis not present

## 2020-02-24 DIAGNOSIS — R29898 Other symptoms and signs involving the musculoskeletal system: Secondary | ICD-10-CM | POA: Diagnosis not present

## 2020-02-24 DIAGNOSIS — E0590 Thyrotoxicosis, unspecified without thyrotoxic crisis or storm: Secondary | ICD-10-CM | POA: Diagnosis not present

## 2020-02-24 DIAGNOSIS — S299XXA Unspecified injury of thorax, initial encounter: Secondary | ICD-10-CM | POA: Diagnosis not present

## 2020-02-24 DIAGNOSIS — Z7401 Bed confinement status: Secondary | ICD-10-CM | POA: Diagnosis not present

## 2020-02-24 DIAGNOSIS — J986 Disorders of diaphragm: Secondary | ICD-10-CM | POA: Diagnosis not present

## 2020-02-24 DIAGNOSIS — I25810 Atherosclerosis of coronary artery bypass graft(s) without angina pectoris: Secondary | ICD-10-CM | POA: Diagnosis not present

## 2020-02-24 DIAGNOSIS — S82892D Other fracture of left lower leg, subsequent encounter for closed fracture with routine healing: Secondary | ICD-10-CM | POA: Diagnosis not present

## 2020-02-24 DIAGNOSIS — H35019 Changes in retinal vascular appearance, unspecified eye: Secondary | ICD-10-CM | POA: Diagnosis not present

## 2020-02-24 DIAGNOSIS — N8110 Cystocele, unspecified: Secondary | ICD-10-CM | POA: Diagnosis not present

## 2020-02-24 DIAGNOSIS — I499 Cardiac arrhythmia, unspecified: Secondary | ICD-10-CM | POA: Diagnosis not present

## 2020-02-24 DIAGNOSIS — R278 Other lack of coordination: Secondary | ICD-10-CM | POA: Diagnosis not present

## 2020-02-24 DIAGNOSIS — E038 Other specified hypothyroidism: Secondary | ICD-10-CM | POA: Diagnosis not present

## 2020-02-24 DIAGNOSIS — I255 Ischemic cardiomyopathy: Secondary | ICD-10-CM | POA: Diagnosis not present

## 2020-02-24 DIAGNOSIS — G8918 Other acute postprocedural pain: Secondary | ICD-10-CM | POA: Diagnosis not present

## 2020-02-24 DIAGNOSIS — M5432 Sciatica, left side: Secondary | ICD-10-CM | POA: Diagnosis not present

## 2020-02-24 DIAGNOSIS — R918 Other nonspecific abnormal finding of lung field: Secondary | ICD-10-CM | POA: Diagnosis not present

## 2020-02-24 DIAGNOSIS — C44622 Squamous cell carcinoma of skin of right upper limb, including shoulder: Secondary | ICD-10-CM | POA: Diagnosis not present

## 2020-02-24 DIAGNOSIS — R5383 Other fatigue: Secondary | ICD-10-CM | POA: Diagnosis not present

## 2020-02-24 DIAGNOSIS — G44209 Tension-type headache, unspecified, not intractable: Secondary | ICD-10-CM | POA: Diagnosis not present

## 2020-02-24 DIAGNOSIS — I6300 Cerebral infarction due to thrombosis of unspecified precerebral artery: Secondary | ICD-10-CM | POA: Diagnosis not present

## 2020-02-24 DIAGNOSIS — Z7982 Long term (current) use of aspirin: Secondary | ICD-10-CM | POA: Diagnosis not present

## 2020-02-24 DIAGNOSIS — Z801 Family history of malignant neoplasm of trachea, bronchus and lung: Secondary | ICD-10-CM | POA: Diagnosis not present

## 2020-02-24 DIAGNOSIS — R0683 Snoring: Secondary | ICD-10-CM | POA: Diagnosis not present

## 2020-02-24 DIAGNOSIS — R7301 Impaired fasting glucose: Secondary | ICD-10-CM | POA: Diagnosis not present

## 2020-02-24 DIAGNOSIS — Z170 Estrogen receptor positive status [ER+]: Secondary | ICD-10-CM | POA: Diagnosis not present

## 2020-02-24 DIAGNOSIS — Z66 Do not resuscitate: Secondary | ICD-10-CM | POA: Diagnosis not present

## 2020-02-24 DIAGNOSIS — M5417 Radiculopathy, lumbosacral region: Secondary | ICD-10-CM | POA: Diagnosis not present

## 2020-02-24 DIAGNOSIS — E079 Disorder of thyroid, unspecified: Secondary | ICD-10-CM | POA: Diagnosis not present

## 2020-02-24 DIAGNOSIS — I252 Old myocardial infarction: Secondary | ICD-10-CM | POA: Diagnosis not present

## 2020-02-24 DIAGNOSIS — R6250 Unspecified lack of expected normal physiological development in childhood: Secondary | ICD-10-CM | POA: Diagnosis not present

## 2020-02-24 DIAGNOSIS — F321 Major depressive disorder, single episode, moderate: Secondary | ICD-10-CM | POA: Diagnosis not present

## 2020-02-24 DIAGNOSIS — M79644 Pain in right finger(s): Secondary | ICD-10-CM | POA: Diagnosis not present

## 2020-02-24 DIAGNOSIS — M8580 Other specified disorders of bone density and structure, unspecified site: Secondary | ICD-10-CM | POA: Diagnosis not present

## 2020-02-24 DIAGNOSIS — Z862 Personal history of diseases of the blood and blood-forming organs and certain disorders involving the immune mechanism: Secondary | ICD-10-CM | POA: Diagnosis not present

## 2020-02-24 DIAGNOSIS — N320 Bladder-neck obstruction: Secondary | ICD-10-CM | POA: Diagnosis not present

## 2020-02-24 DIAGNOSIS — S90211A Contusion of right great toe with damage to nail, initial encounter: Secondary | ICD-10-CM | POA: Diagnosis not present

## 2020-02-24 DIAGNOSIS — D473 Essential (hemorrhagic) thrombocythemia: Secondary | ICD-10-CM | POA: Diagnosis not present

## 2020-02-24 DIAGNOSIS — M7542 Impingement syndrome of left shoulder: Secondary | ICD-10-CM | POA: Diagnosis not present

## 2020-02-24 DIAGNOSIS — I160 Hypertensive urgency: Secondary | ICD-10-CM | POA: Diagnosis not present

## 2020-02-24 DIAGNOSIS — R3915 Urgency of urination: Secondary | ICD-10-CM | POA: Diagnosis not present

## 2020-02-24 DIAGNOSIS — Z125 Encounter for screening for malignant neoplasm of prostate: Secondary | ICD-10-CM | POA: Diagnosis not present

## 2020-02-24 DIAGNOSIS — M2560 Stiffness of unspecified joint, not elsewhere classified: Secondary | ICD-10-CM | POA: Diagnosis not present

## 2020-02-24 DIAGNOSIS — E1165 Type 2 diabetes mellitus with hyperglycemia: Secondary | ICD-10-CM | POA: Diagnosis not present

## 2020-02-24 DIAGNOSIS — E349 Endocrine disorder, unspecified: Secondary | ICD-10-CM | POA: Diagnosis not present

## 2020-02-24 DIAGNOSIS — G301 Alzheimer's disease with late onset: Secondary | ICD-10-CM | POA: Diagnosis not present

## 2020-02-24 DIAGNOSIS — R0609 Other forms of dyspnea: Secondary | ICD-10-CM | POA: Diagnosis not present

## 2020-02-24 DIAGNOSIS — M25572 Pain in left ankle and joints of left foot: Secondary | ICD-10-CM | POA: Diagnosis not present

## 2020-02-24 DIAGNOSIS — J4520 Mild intermittent asthma, uncomplicated: Secondary | ICD-10-CM | POA: Diagnosis not present

## 2020-02-24 DIAGNOSIS — K828 Other specified diseases of gallbladder: Secondary | ICD-10-CM | POA: Diagnosis not present

## 2020-02-24 DIAGNOSIS — C50412 Malignant neoplasm of upper-outer quadrant of left female breast: Secondary | ICD-10-CM | POA: Diagnosis not present

## 2020-02-24 DIAGNOSIS — H18593 Other hereditary corneal dystrophies, bilateral: Secondary | ICD-10-CM | POA: Diagnosis not present

## 2020-02-24 DIAGNOSIS — T80818A Extravasation of other vesicant agent, initial encounter: Secondary | ICD-10-CM | POA: Diagnosis not present

## 2020-02-24 DIAGNOSIS — H5203 Hypermetropia, bilateral: Secondary | ICD-10-CM | POA: Diagnosis not present

## 2020-02-24 DIAGNOSIS — H43393 Other vitreous opacities, bilateral: Secondary | ICD-10-CM | POA: Diagnosis not present

## 2020-02-24 DIAGNOSIS — Z955 Presence of coronary angioplasty implant and graft: Secondary | ICD-10-CM | POA: Diagnosis not present

## 2020-02-24 DIAGNOSIS — N401 Enlarged prostate with lower urinary tract symptoms: Secondary | ICD-10-CM | POA: Diagnosis not present

## 2020-02-24 DIAGNOSIS — N189 Chronic kidney disease, unspecified: Secondary | ICD-10-CM | POA: Diagnosis not present

## 2020-02-24 DIAGNOSIS — N182 Chronic kidney disease, stage 2 (mild): Secondary | ICD-10-CM | POA: Diagnosis not present

## 2020-02-24 DIAGNOSIS — G92 Toxic encephalopathy: Secondary | ICD-10-CM | POA: Diagnosis not present

## 2020-02-24 DIAGNOSIS — C154 Malignant neoplasm of middle third of esophagus: Secondary | ICD-10-CM | POA: Diagnosis not present

## 2020-02-24 DIAGNOSIS — J984 Other disorders of lung: Secondary | ICD-10-CM | POA: Diagnosis not present

## 2020-02-24 DIAGNOSIS — H903 Sensorineural hearing loss, bilateral: Secondary | ICD-10-CM | POA: Diagnosis not present

## 2020-02-24 DIAGNOSIS — Z885 Allergy status to narcotic agent status: Secondary | ICD-10-CM | POA: Diagnosis not present

## 2020-02-24 DIAGNOSIS — G4733 Obstructive sleep apnea (adult) (pediatric): Secondary | ICD-10-CM | POA: Diagnosis not present

## 2020-02-24 DIAGNOSIS — R0902 Hypoxemia: Secondary | ICD-10-CM | POA: Diagnosis not present

## 2020-02-24 DIAGNOSIS — M5431 Sciatica, right side: Secondary | ICD-10-CM | POA: Diagnosis not present

## 2020-02-24 DIAGNOSIS — D0471 Carcinoma in situ of skin of right lower limb, including hip: Secondary | ICD-10-CM | POA: Diagnosis not present

## 2020-02-24 DIAGNOSIS — E1151 Type 2 diabetes mellitus with diabetic peripheral angiopathy without gangrene: Secondary | ICD-10-CM | POA: Diagnosis not present

## 2020-02-24 DIAGNOSIS — E11628 Type 2 diabetes mellitus with other skin complications: Secondary | ICD-10-CM | POA: Diagnosis not present

## 2020-02-24 DIAGNOSIS — Z8711 Personal history of peptic ulcer disease: Secondary | ICD-10-CM | POA: Diagnosis not present

## 2020-02-24 DIAGNOSIS — M25761 Osteophyte, right knee: Secondary | ICD-10-CM | POA: Diagnosis not present

## 2020-02-24 DIAGNOSIS — F3341 Major depressive disorder, recurrent, in partial remission: Secondary | ICD-10-CM | POA: Diagnosis not present

## 2020-02-24 DIAGNOSIS — H3581 Retinal edema: Secondary | ICD-10-CM | POA: Diagnosis not present

## 2020-02-24 DIAGNOSIS — Z87448 Personal history of other diseases of urinary system: Secondary | ICD-10-CM | POA: Diagnosis not present

## 2020-02-24 DIAGNOSIS — M7989 Other specified soft tissue disorders: Secondary | ICD-10-CM | POA: Diagnosis not present

## 2020-02-24 DIAGNOSIS — F331 Major depressive disorder, recurrent, moderate: Secondary | ICD-10-CM | POA: Diagnosis not present

## 2020-02-24 DIAGNOSIS — G3184 Mild cognitive impairment, so stated: Secondary | ICD-10-CM | POA: Diagnosis not present

## 2020-02-24 DIAGNOSIS — M9080 Osteopathy in diseases classified elsewhere, unspecified site: Secondary | ICD-10-CM | POA: Diagnosis not present

## 2020-02-24 DIAGNOSIS — D539 Nutritional anemia, unspecified: Secondary | ICD-10-CM | POA: Diagnosis not present

## 2020-02-24 DIAGNOSIS — S91109A Unspecified open wound of unspecified toe(s) without damage to nail, initial encounter: Secondary | ICD-10-CM | POA: Diagnosis not present

## 2020-02-24 DIAGNOSIS — F319 Bipolar disorder, unspecified: Secondary | ICD-10-CM | POA: Diagnosis not present

## 2020-02-24 DIAGNOSIS — R1310 Dysphagia, unspecified: Secondary | ICD-10-CM | POA: Diagnosis not present

## 2020-02-24 DIAGNOSIS — Z85820 Personal history of malignant melanoma of skin: Secondary | ICD-10-CM | POA: Diagnosis not present

## 2020-02-24 DIAGNOSIS — Z1283 Encounter for screening for malignant neoplasm of skin: Secondary | ICD-10-CM | POA: Diagnosis not present

## 2020-02-24 DIAGNOSIS — M1811 Unilateral primary osteoarthritis of first carpometacarpal joint, right hand: Secondary | ICD-10-CM | POA: Diagnosis not present

## 2020-02-24 DIAGNOSIS — I493 Ventricular premature depolarization: Secondary | ICD-10-CM | POA: Diagnosis not present

## 2020-02-24 DIAGNOSIS — L304 Erythema intertrigo: Secondary | ICD-10-CM | POA: Diagnosis not present

## 2020-02-24 DIAGNOSIS — B353 Tinea pedis: Secondary | ICD-10-CM | POA: Diagnosis not present

## 2020-02-24 DIAGNOSIS — Z9181 History of falling: Secondary | ICD-10-CM | POA: Diagnosis not present

## 2020-02-24 DIAGNOSIS — S7001XA Contusion of right hip, initial encounter: Secondary | ICD-10-CM | POA: Diagnosis not present

## 2020-02-24 DIAGNOSIS — F3160 Bipolar disorder, current episode mixed, unspecified: Secondary | ICD-10-CM | POA: Diagnosis not present

## 2020-02-24 DIAGNOSIS — M6530 Trigger finger, unspecified finger: Secondary | ICD-10-CM | POA: Diagnosis not present

## 2020-02-24 DIAGNOSIS — R072 Precordial pain: Secondary | ICD-10-CM | POA: Diagnosis not present

## 2020-02-24 DIAGNOSIS — Z9884 Bariatric surgery status: Secondary | ICD-10-CM | POA: Diagnosis not present

## 2020-02-24 DIAGNOSIS — L8943 Pressure ulcer of contiguous site of back, buttock and hip, stage 3: Secondary | ICD-10-CM | POA: Diagnosis not present

## 2020-02-24 DIAGNOSIS — J948 Other specified pleural conditions: Secondary | ICD-10-CM | POA: Diagnosis not present

## 2020-02-24 DIAGNOSIS — L300 Nummular dermatitis: Secondary | ICD-10-CM | POA: Diagnosis not present

## 2020-02-24 DIAGNOSIS — M25612 Stiffness of left shoulder, not elsewhere classified: Secondary | ICD-10-CM | POA: Diagnosis not present

## 2020-02-24 DIAGNOSIS — Z779 Other contact with and (suspected) exposures hazardous to health: Secondary | ICD-10-CM | POA: Diagnosis not present

## 2020-02-24 DIAGNOSIS — R008 Other abnormalities of heart beat: Secondary | ICD-10-CM | POA: Diagnosis not present

## 2020-02-24 DIAGNOSIS — Z95820 Peripheral vascular angioplasty status with implants and grafts: Secondary | ICD-10-CM | POA: Diagnosis not present

## 2020-02-24 DIAGNOSIS — N183 Chronic kidney disease, stage 3 unspecified: Secondary | ICD-10-CM | POA: Diagnosis not present

## 2020-02-24 DIAGNOSIS — E113293 Type 2 diabetes mellitus with mild nonproliferative diabetic retinopathy without macular edema, bilateral: Secondary | ICD-10-CM | POA: Diagnosis not present

## 2020-02-24 DIAGNOSIS — H8113 Benign paroxysmal vertigo, bilateral: Secondary | ICD-10-CM | POA: Diagnosis not present

## 2020-02-24 DIAGNOSIS — D509 Iron deficiency anemia, unspecified: Secondary | ICD-10-CM | POA: Diagnosis not present

## 2020-02-24 DIAGNOSIS — J939 Pneumothorax, unspecified: Secondary | ICD-10-CM | POA: Diagnosis not present

## 2020-02-24 DIAGNOSIS — I083 Combined rheumatic disorders of mitral, aortic and tricuspid valves: Secondary | ICD-10-CM | POA: Diagnosis not present

## 2020-02-24 DIAGNOSIS — S3289XA Fracture of other parts of pelvis, initial encounter for closed fracture: Secondary | ICD-10-CM | POA: Diagnosis not present

## 2020-02-24 DIAGNOSIS — D225 Melanocytic nevi of trunk: Secondary | ICD-10-CM | POA: Diagnosis not present

## 2020-02-24 DIAGNOSIS — Z96651 Presence of right artificial knee joint: Secondary | ICD-10-CM | POA: Diagnosis not present

## 2020-02-24 DIAGNOSIS — Q549 Hypospadias, unspecified: Secondary | ICD-10-CM | POA: Diagnosis not present

## 2020-02-24 DIAGNOSIS — J302 Other seasonal allergic rhinitis: Secondary | ICD-10-CM | POA: Diagnosis not present

## 2020-02-24 DIAGNOSIS — X32XXXS Exposure to sunlight, sequela: Secondary | ICD-10-CM | POA: Diagnosis not present

## 2020-02-24 DIAGNOSIS — K30 Functional dyspepsia: Secondary | ICD-10-CM | POA: Diagnosis not present

## 2020-02-24 DIAGNOSIS — N529 Male erectile dysfunction, unspecified: Secondary | ICD-10-CM | POA: Diagnosis not present

## 2020-02-24 DIAGNOSIS — E1121 Type 2 diabetes mellitus with diabetic nephropathy: Secondary | ICD-10-CM | POA: Diagnosis not present

## 2020-02-24 DIAGNOSIS — E113313 Type 2 diabetes mellitus with moderate nonproliferative diabetic retinopathy with macular edema, bilateral: Secondary | ICD-10-CM | POA: Diagnosis not present

## 2020-02-24 DIAGNOSIS — K581 Irritable bowel syndrome with constipation: Secondary | ICD-10-CM | POA: Diagnosis not present

## 2020-02-24 DIAGNOSIS — Z8719 Personal history of other diseases of the digestive system: Secondary | ICD-10-CM | POA: Diagnosis not present

## 2020-02-24 DIAGNOSIS — H35363 Drusen (degenerative) of macula, bilateral: Secondary | ICD-10-CM | POA: Diagnosis not present

## 2020-02-24 DIAGNOSIS — S59901A Unspecified injury of right elbow, initial encounter: Secondary | ICD-10-CM | POA: Diagnosis not present

## 2020-02-24 DIAGNOSIS — M79605 Pain in left leg: Secondary | ICD-10-CM | POA: Diagnosis not present

## 2020-02-24 DIAGNOSIS — L602 Onychogryphosis: Secondary | ICD-10-CM | POA: Diagnosis not present

## 2020-02-24 DIAGNOSIS — E871 Hypo-osmolality and hyponatremia: Secondary | ICD-10-CM | POA: Diagnosis not present

## 2020-02-24 DIAGNOSIS — Z86711 Personal history of pulmonary embolism: Secondary | ICD-10-CM | POA: Diagnosis not present

## 2020-02-24 DIAGNOSIS — Z8379 Family history of other diseases of the digestive system: Secondary | ICD-10-CM | POA: Diagnosis not present

## 2020-02-24 DIAGNOSIS — H04123 Dry eye syndrome of bilateral lacrimal glands: Secondary | ICD-10-CM | POA: Diagnosis not present

## 2020-02-24 DIAGNOSIS — M4807 Spinal stenosis, lumbosacral region: Secondary | ICD-10-CM | POA: Diagnosis not present

## 2020-02-24 DIAGNOSIS — S42209A Unspecified fracture of upper end of unspecified humerus, initial encounter for closed fracture: Secondary | ICD-10-CM | POA: Diagnosis not present

## 2020-02-24 DIAGNOSIS — F1190 Opioid use, unspecified, uncomplicated: Secondary | ICD-10-CM | POA: Diagnosis not present

## 2020-02-24 DIAGNOSIS — J42 Unspecified chronic bronchitis: Secondary | ICD-10-CM | POA: Diagnosis not present

## 2020-02-24 DIAGNOSIS — R0602 Shortness of breath: Secondary | ICD-10-CM | POA: Diagnosis not present

## 2020-02-24 DIAGNOSIS — F902 Attention-deficit hyperactivity disorder, combined type: Secondary | ICD-10-CM | POA: Diagnosis not present

## 2020-02-24 DIAGNOSIS — F3289 Other specified depressive episodes: Secondary | ICD-10-CM | POA: Diagnosis not present

## 2020-02-24 DIAGNOSIS — L950 Livedoid vasculitis: Secondary | ICD-10-CM | POA: Diagnosis not present

## 2020-02-24 DIAGNOSIS — H353212 Exudative age-related macular degeneration, right eye, with inactive choroidal neovascularization: Secondary | ICD-10-CM | POA: Diagnosis not present

## 2020-02-24 DIAGNOSIS — K5000 Crohn's disease of small intestine without complications: Secondary | ICD-10-CM | POA: Diagnosis not present

## 2020-02-24 DIAGNOSIS — A048 Other specified bacterial intestinal infections: Secondary | ICD-10-CM | POA: Diagnosis not present

## 2020-02-24 DIAGNOSIS — M2569 Stiffness of other specified joint, not elsewhere classified: Secondary | ICD-10-CM | POA: Diagnosis not present

## 2020-02-24 DIAGNOSIS — N185 Chronic kidney disease, stage 5: Secondary | ICD-10-CM | POA: Diagnosis not present

## 2020-02-24 DIAGNOSIS — R233 Spontaneous ecchymoses: Secondary | ICD-10-CM | POA: Diagnosis not present

## 2020-02-24 DIAGNOSIS — Z803 Family history of malignant neoplasm of breast: Secondary | ICD-10-CM | POA: Diagnosis not present

## 2020-02-24 DIAGNOSIS — I872 Venous insufficiency (chronic) (peripheral): Secondary | ICD-10-CM | POA: Diagnosis not present

## 2020-02-24 DIAGNOSIS — D125 Benign neoplasm of sigmoid colon: Secondary | ICD-10-CM | POA: Diagnosis not present

## 2020-02-24 DIAGNOSIS — L80 Vitiligo: Secondary | ICD-10-CM | POA: Diagnosis not present

## 2020-02-24 DIAGNOSIS — N186 End stage renal disease: Secondary | ICD-10-CM | POA: Diagnosis not present

## 2020-02-24 DIAGNOSIS — Z9079 Acquired absence of other genital organ(s): Secondary | ICD-10-CM | POA: Diagnosis not present

## 2020-02-24 DIAGNOSIS — Z724 Inappropriate diet and eating habits: Secondary | ICD-10-CM | POA: Diagnosis not present

## 2020-02-24 DIAGNOSIS — M25552 Pain in left hip: Secondary | ICD-10-CM | POA: Diagnosis not present

## 2020-02-24 DIAGNOSIS — B351 Tinea unguium: Secondary | ICD-10-CM | POA: Diagnosis not present

## 2020-02-24 DIAGNOSIS — M25541 Pain in joints of right hand: Secondary | ICD-10-CM | POA: Diagnosis not present

## 2020-02-24 DIAGNOSIS — Z780 Asymptomatic menopausal state: Secondary | ICD-10-CM | POA: Diagnosis not present

## 2020-02-24 DIAGNOSIS — M797 Fibromyalgia: Secondary | ICD-10-CM | POA: Diagnosis not present

## 2020-02-24 DIAGNOSIS — K449 Diaphragmatic hernia without obstruction or gangrene: Secondary | ICD-10-CM | POA: Diagnosis not present

## 2020-02-24 DIAGNOSIS — L501 Idiopathic urticaria: Secondary | ICD-10-CM | POA: Diagnosis not present

## 2020-02-24 DIAGNOSIS — Z872 Personal history of diseases of the skin and subcutaneous tissue: Secondary | ICD-10-CM | POA: Diagnosis not present

## 2020-02-24 DIAGNOSIS — M961 Postlaminectomy syndrome, not elsewhere classified: Secondary | ICD-10-CM | POA: Diagnosis not present

## 2020-02-24 DIAGNOSIS — R809 Proteinuria, unspecified: Secondary | ICD-10-CM | POA: Diagnosis not present

## 2020-02-24 DIAGNOSIS — C44222 Squamous cell carcinoma of skin of right ear and external auricular canal: Secondary | ICD-10-CM | POA: Diagnosis not present

## 2020-02-24 DIAGNOSIS — M25812 Other specified joint disorders, left shoulder: Secondary | ICD-10-CM | POA: Diagnosis not present

## 2020-02-24 DIAGNOSIS — D751 Secondary polycythemia: Secondary | ICD-10-CM | POA: Diagnosis not present

## 2020-02-24 DIAGNOSIS — K224 Dyskinesia of esophagus: Secondary | ICD-10-CM | POA: Diagnosis not present

## 2020-02-24 DIAGNOSIS — G5601 Carpal tunnel syndrome, right upper limb: Secondary | ICD-10-CM | POA: Diagnosis not present

## 2020-02-24 DIAGNOSIS — L219 Seborrheic dermatitis, unspecified: Secondary | ICD-10-CM | POA: Diagnosis not present

## 2020-02-24 DIAGNOSIS — C210 Malignant neoplasm of anus, unspecified: Secondary | ICD-10-CM | POA: Diagnosis not present

## 2020-02-24 DIAGNOSIS — N400 Enlarged prostate without lower urinary tract symptoms: Secondary | ICD-10-CM | POA: Diagnosis not present

## 2020-02-24 DIAGNOSIS — I119 Hypertensive heart disease without heart failure: Secondary | ICD-10-CM | POA: Diagnosis not present

## 2020-02-24 DIAGNOSIS — M792 Neuralgia and neuritis, unspecified: Secondary | ICD-10-CM | POA: Diagnosis not present

## 2020-02-24 DIAGNOSIS — J324 Chronic pansinusitis: Secondary | ICD-10-CM | POA: Diagnosis not present

## 2020-02-24 DIAGNOSIS — D34 Benign neoplasm of thyroid gland: Secondary | ICD-10-CM | POA: Diagnosis not present

## 2020-02-24 DIAGNOSIS — S60561A Insect bite (nonvenomous) of right hand, initial encounter: Secondary | ICD-10-CM | POA: Diagnosis not present

## 2020-02-24 DIAGNOSIS — C220 Liver cell carcinoma: Secondary | ICD-10-CM | POA: Diagnosis not present

## 2020-02-24 DIAGNOSIS — M47812 Spondylosis without myelopathy or radiculopathy, cervical region: Secondary | ICD-10-CM | POA: Diagnosis not present

## 2020-02-24 DIAGNOSIS — D239 Other benign neoplasm of skin, unspecified: Secondary | ICD-10-CM | POA: Diagnosis not present

## 2020-02-24 DIAGNOSIS — F1120 Opioid dependence, uncomplicated: Secondary | ICD-10-CM | POA: Diagnosis not present

## 2020-02-24 DIAGNOSIS — Z89611 Acquired absence of right leg above knee: Secondary | ICD-10-CM | POA: Diagnosis not present

## 2020-02-24 DIAGNOSIS — I4510 Unspecified right bundle-branch block: Secondary | ICD-10-CM | POA: Diagnosis not present

## 2020-02-24 DIAGNOSIS — G63 Polyneuropathy in diseases classified elsewhere: Secondary | ICD-10-CM | POA: Diagnosis not present

## 2020-02-24 DIAGNOSIS — L853 Xerosis cutis: Secondary | ICD-10-CM | POA: Diagnosis not present

## 2020-02-24 DIAGNOSIS — R1032 Left lower quadrant pain: Secondary | ICD-10-CM | POA: Diagnosis not present

## 2020-02-24 DIAGNOSIS — R29715 NIHSS score 15: Secondary | ICD-10-CM | POA: Diagnosis not present

## 2020-02-24 DIAGNOSIS — D559 Anemia due to enzyme disorder, unspecified: Secondary | ICD-10-CM | POA: Diagnosis not present

## 2020-02-24 DIAGNOSIS — E083392 Diabetes mellitus due to underlying condition with moderate nonproliferative diabetic retinopathy without macular edema, left eye: Secondary | ICD-10-CM | POA: Diagnosis not present

## 2020-02-24 DIAGNOSIS — Z6833 Body mass index (BMI) 33.0-33.9, adult: Secondary | ICD-10-CM | POA: Diagnosis not present

## 2020-02-24 DIAGNOSIS — I200 Unstable angina: Secondary | ICD-10-CM | POA: Diagnosis not present

## 2020-02-24 DIAGNOSIS — F330 Major depressive disorder, recurrent, mild: Secondary | ICD-10-CM | POA: Diagnosis not present

## 2020-02-24 DIAGNOSIS — Z9989 Dependence on other enabling machines and devices: Secondary | ICD-10-CM | POA: Diagnosis not present

## 2020-02-24 DIAGNOSIS — Z03818 Encounter for observation for suspected exposure to other biological agents ruled out: Secondary | ICD-10-CM | POA: Diagnosis not present

## 2020-02-24 DIAGNOSIS — N289 Disorder of kidney and ureter, unspecified: Secondary | ICD-10-CM | POA: Diagnosis not present

## 2020-02-24 DIAGNOSIS — Z515 Encounter for palliative care: Secondary | ICD-10-CM | POA: Diagnosis not present

## 2020-02-24 DIAGNOSIS — Z720 Tobacco use: Secondary | ICD-10-CM | POA: Diagnosis not present

## 2020-02-24 DIAGNOSIS — M40209 Unspecified kyphosis, site unspecified: Secondary | ICD-10-CM | POA: Diagnosis not present

## 2020-02-24 DIAGNOSIS — Z95818 Presence of other cardiac implants and grafts: Secondary | ICD-10-CM | POA: Diagnosis not present

## 2020-02-24 DIAGNOSIS — R7401 Elevation of levels of liver transaminase levels: Secondary | ICD-10-CM | POA: Diagnosis not present

## 2020-02-24 DIAGNOSIS — Z96612 Presence of left artificial shoulder joint: Secondary | ICD-10-CM | POA: Diagnosis not present

## 2020-02-24 DIAGNOSIS — I83812 Varicose veins of left lower extremities with pain: Secondary | ICD-10-CM | POA: Diagnosis not present

## 2020-02-24 DIAGNOSIS — H1789 Other corneal scars and opacities: Secondary | ICD-10-CM | POA: Diagnosis not present

## 2020-02-24 DIAGNOSIS — I614 Nontraumatic intracerebral hemorrhage in cerebellum: Secondary | ICD-10-CM | POA: Diagnosis not present

## 2020-02-24 DIAGNOSIS — I259 Chronic ischemic heart disease, unspecified: Secondary | ICD-10-CM | POA: Diagnosis not present

## 2020-02-24 DIAGNOSIS — D6869 Other thrombophilia: Secondary | ICD-10-CM | POA: Diagnosis not present

## 2020-02-24 DIAGNOSIS — Z953 Presence of xenogenic heart valve: Secondary | ICD-10-CM | POA: Diagnosis not present

## 2020-02-24 DIAGNOSIS — M5442 Lumbago with sciatica, left side: Secondary | ICD-10-CM | POA: Diagnosis not present

## 2020-02-24 DIAGNOSIS — I714 Abdominal aortic aneurysm, without rupture: Secondary | ICD-10-CM | POA: Diagnosis not present

## 2020-02-24 DIAGNOSIS — M06041 Rheumatoid arthritis without rheumatoid factor, right hand: Secondary | ICD-10-CM | POA: Diagnosis not present

## 2020-02-24 DIAGNOSIS — R2232 Localized swelling, mass and lump, left upper limb: Secondary | ICD-10-CM | POA: Diagnosis not present

## 2020-02-24 DIAGNOSIS — Z86010 Personal history of colonic polyps: Secondary | ICD-10-CM | POA: Diagnosis not present

## 2020-02-24 DIAGNOSIS — F4310 Post-traumatic stress disorder, unspecified: Secondary | ICD-10-CM | POA: Diagnosis not present

## 2020-02-24 DIAGNOSIS — M79676 Pain in unspecified toe(s): Secondary | ICD-10-CM | POA: Diagnosis not present

## 2020-02-24 DIAGNOSIS — J90 Pleural effusion, not elsewhere classified: Secondary | ICD-10-CM | POA: Diagnosis not present

## 2020-02-24 DIAGNOSIS — M818 Other osteoporosis without current pathological fracture: Secondary | ICD-10-CM | POA: Diagnosis not present

## 2020-02-24 DIAGNOSIS — J9600 Acute respiratory failure, unspecified whether with hypoxia or hypercapnia: Secondary | ICD-10-CM | POA: Diagnosis not present

## 2020-02-24 DIAGNOSIS — R6889 Other general symptoms and signs: Secondary | ICD-10-CM | POA: Diagnosis not present

## 2020-02-24 DIAGNOSIS — Z8673 Personal history of transient ischemic attack (TIA), and cerebral infarction without residual deficits: Secondary | ICD-10-CM | POA: Diagnosis not present

## 2020-02-24 DIAGNOSIS — Z4802 Encounter for removal of sutures: Secondary | ICD-10-CM | POA: Diagnosis not present

## 2020-02-24 DIAGNOSIS — M109 Gout, unspecified: Secondary | ICD-10-CM | POA: Diagnosis not present

## 2020-02-24 DIAGNOSIS — K222 Esophageal obstruction: Secondary | ICD-10-CM | POA: Diagnosis not present

## 2020-02-24 DIAGNOSIS — H269 Unspecified cataract: Secondary | ICD-10-CM | POA: Diagnosis not present

## 2020-02-24 DIAGNOSIS — K5901 Slow transit constipation: Secondary | ICD-10-CM | POA: Diagnosis not present

## 2020-02-24 DIAGNOSIS — E1142 Type 2 diabetes mellitus with diabetic polyneuropathy: Secondary | ICD-10-CM | POA: Diagnosis not present

## 2020-02-24 DIAGNOSIS — R42 Dizziness and giddiness: Secondary | ICD-10-CM | POA: Diagnosis not present

## 2020-02-24 DIAGNOSIS — M4126 Other idiopathic scoliosis, lumbar region: Secondary | ICD-10-CM | POA: Diagnosis not present

## 2020-02-24 DIAGNOSIS — R4189 Other symptoms and signs involving cognitive functions and awareness: Secondary | ICD-10-CM | POA: Diagnosis not present

## 2020-02-24 DIAGNOSIS — M1000 Idiopathic gout, unspecified site: Secondary | ICD-10-CM | POA: Diagnosis not present

## 2020-02-24 DIAGNOSIS — Z5181 Encounter for therapeutic drug level monitoring: Secondary | ICD-10-CM | POA: Diagnosis not present

## 2020-02-24 DIAGNOSIS — S46012D Strain of muscle(s) and tendon(s) of the rotator cuff of left shoulder, subsequent encounter: Secondary | ICD-10-CM | POA: Diagnosis not present

## 2020-02-24 DIAGNOSIS — I350 Nonrheumatic aortic (valve) stenosis: Secondary | ICD-10-CM | POA: Diagnosis not present

## 2020-02-24 DIAGNOSIS — B0052 Herpesviral keratitis: Secondary | ICD-10-CM | POA: Diagnosis not present

## 2020-02-24 DIAGNOSIS — R570 Cardiogenic shock: Secondary | ICD-10-CM | POA: Diagnosis not present

## 2020-02-24 DIAGNOSIS — E063 Autoimmune thyroiditis: Secondary | ICD-10-CM | POA: Diagnosis not present

## 2020-02-24 DIAGNOSIS — J9621 Acute and chronic respiratory failure with hypoxia: Secondary | ICD-10-CM | POA: Diagnosis not present

## 2020-02-24 DIAGNOSIS — G5761 Lesion of plantar nerve, right lower limb: Secondary | ICD-10-CM | POA: Diagnosis not present

## 2020-02-24 DIAGNOSIS — M5126 Other intervertebral disc displacement, lumbar region: Secondary | ICD-10-CM | POA: Diagnosis not present

## 2020-02-24 DIAGNOSIS — L299 Pruritus, unspecified: Secondary | ICD-10-CM | POA: Diagnosis not present

## 2020-02-24 DIAGNOSIS — M25629 Stiffness of unspecified elbow, not elsewhere classified: Secondary | ICD-10-CM | POA: Diagnosis not present

## 2020-02-24 DIAGNOSIS — J920 Pleural plaque with presence of asbestos: Secondary | ICD-10-CM | POA: Diagnosis not present

## 2020-02-24 DIAGNOSIS — I442 Atrioventricular block, complete: Secondary | ICD-10-CM | POA: Diagnosis not present

## 2020-02-24 DIAGNOSIS — E11319 Type 2 diabetes mellitus with unspecified diabetic retinopathy without macular edema: Secondary | ICD-10-CM | POA: Diagnosis not present

## 2020-02-24 DIAGNOSIS — R928 Other abnormal and inconclusive findings on diagnostic imaging of breast: Secondary | ICD-10-CM | POA: Diagnosis not present

## 2020-02-24 DIAGNOSIS — F0280 Dementia in other diseases classified elsewhere without behavioral disturbance: Secondary | ICD-10-CM | POA: Diagnosis not present

## 2020-02-24 DIAGNOSIS — I63443 Cerebral infarction due to embolism of bilateral cerebellar arteries: Secondary | ICD-10-CM | POA: Diagnosis not present

## 2020-02-24 DIAGNOSIS — I10 Essential (primary) hypertension: Secondary | ICD-10-CM | POA: Diagnosis not present

## 2020-02-24 DIAGNOSIS — L719 Rosacea, unspecified: Secondary | ICD-10-CM | POA: Diagnosis not present

## 2020-02-24 DIAGNOSIS — K589 Irritable bowel syndrome without diarrhea: Secondary | ICD-10-CM | POA: Diagnosis not present

## 2020-02-24 DIAGNOSIS — F17290 Nicotine dependence, other tobacco product, uncomplicated: Secondary | ICD-10-CM | POA: Diagnosis not present

## 2020-02-24 DIAGNOSIS — Z880 Allergy status to penicillin: Secondary | ICD-10-CM | POA: Diagnosis not present

## 2020-02-24 DIAGNOSIS — Z981 Arthrodesis status: Secondary | ICD-10-CM | POA: Diagnosis not present

## 2020-02-24 DIAGNOSIS — H353221 Exudative age-related macular degeneration, left eye, with active choroidal neovascularization: Secondary | ICD-10-CM | POA: Diagnosis not present

## 2020-02-24 DIAGNOSIS — I83892 Varicose veins of left lower extremities with other complications: Secondary | ICD-10-CM | POA: Diagnosis not present

## 2020-02-24 DIAGNOSIS — L03032 Cellulitis of left toe: Secondary | ICD-10-CM | POA: Diagnosis not present

## 2020-02-24 DIAGNOSIS — C50919 Malignant neoplasm of unspecified site of unspecified female breast: Secondary | ICD-10-CM | POA: Diagnosis not present

## 2020-02-24 DIAGNOSIS — K1329 Other disturbances of oral epithelium, including tongue: Secondary | ICD-10-CM | POA: Diagnosis not present

## 2020-02-24 DIAGNOSIS — H3410 Central retinal artery occlusion, unspecified eye: Secondary | ICD-10-CM | POA: Diagnosis not present

## 2020-02-24 DIAGNOSIS — Z789 Other specified health status: Secondary | ICD-10-CM | POA: Diagnosis not present

## 2020-02-24 DIAGNOSIS — S0232XA Fracture of orbital floor, left side, initial encounter for closed fracture: Secondary | ICD-10-CM | POA: Diagnosis not present

## 2020-02-24 DIAGNOSIS — M79671 Pain in right foot: Secondary | ICD-10-CM | POA: Diagnosis not present

## 2020-02-24 DIAGNOSIS — D72828 Other elevated white blood cell count: Secondary | ICD-10-CM | POA: Diagnosis not present

## 2020-02-24 DIAGNOSIS — I8312 Varicose veins of left lower extremity with inflammation: Secondary | ICD-10-CM | POA: Diagnosis not present

## 2020-02-24 DIAGNOSIS — K315 Obstruction of duodenum: Secondary | ICD-10-CM | POA: Diagnosis not present

## 2020-02-24 DIAGNOSIS — M4714 Other spondylosis with myelopathy, thoracic region: Secondary | ICD-10-CM | POA: Diagnosis not present

## 2020-02-24 DIAGNOSIS — M19012 Primary osteoarthritis, left shoulder: Secondary | ICD-10-CM | POA: Diagnosis not present

## 2020-02-24 DIAGNOSIS — D120 Benign neoplasm of cecum: Secondary | ICD-10-CM | POA: Diagnosis not present

## 2020-02-24 DIAGNOSIS — Z5111 Encounter for antineoplastic chemotherapy: Secondary | ICD-10-CM | POA: Diagnosis not present

## 2020-02-24 DIAGNOSIS — S43431A Superior glenoid labrum lesion of right shoulder, initial encounter: Secondary | ICD-10-CM | POA: Diagnosis not present

## 2020-02-24 DIAGNOSIS — C44219 Basal cell carcinoma of skin of left ear and external auricular canal: Secondary | ICD-10-CM | POA: Diagnosis not present

## 2020-02-24 DIAGNOSIS — M159 Polyosteoarthritis, unspecified: Secondary | ICD-10-CM | POA: Diagnosis not present

## 2020-02-24 DIAGNOSIS — Z466 Encounter for fitting and adjustment of urinary device: Secondary | ICD-10-CM | POA: Diagnosis not present

## 2020-02-24 DIAGNOSIS — R351 Nocturia: Secondary | ICD-10-CM | POA: Diagnosis not present

## 2020-02-24 DIAGNOSIS — M7541 Impingement syndrome of right shoulder: Secondary | ICD-10-CM | POA: Diagnosis not present

## 2020-02-24 DIAGNOSIS — M0579 Rheumatoid arthritis with rheumatoid factor of multiple sites without organ or systems involvement: Secondary | ICD-10-CM | POA: Diagnosis not present

## 2020-02-24 DIAGNOSIS — R946 Abnormal results of thyroid function studies: Secondary | ICD-10-CM | POA: Diagnosis not present

## 2020-02-24 DIAGNOSIS — R41841 Cognitive communication deficit: Secondary | ICD-10-CM | POA: Diagnosis not present

## 2020-02-24 DIAGNOSIS — T82110D Breakdown (mechanical) of cardiac electrode, subsequent encounter: Secondary | ICD-10-CM | POA: Diagnosis not present

## 2020-02-24 DIAGNOSIS — I482 Chronic atrial fibrillation, unspecified: Secondary | ICD-10-CM | POA: Diagnosis not present

## 2020-02-24 DIAGNOSIS — E440 Moderate protein-calorie malnutrition: Secondary | ICD-10-CM | POA: Diagnosis not present

## 2020-02-24 DIAGNOSIS — G252 Other specified forms of tremor: Secondary | ICD-10-CM | POA: Diagnosis not present

## 2020-02-24 DIAGNOSIS — Z6827 Body mass index (BMI) 27.0-27.9, adult: Secondary | ICD-10-CM | POA: Diagnosis not present

## 2020-02-24 DIAGNOSIS — K7469 Other cirrhosis of liver: Secondary | ICD-10-CM | POA: Diagnosis not present

## 2020-02-24 DIAGNOSIS — D6489 Other specified anemias: Secondary | ICD-10-CM | POA: Diagnosis not present

## 2020-02-24 DIAGNOSIS — M5416 Radiculopathy, lumbar region: Secondary | ICD-10-CM | POA: Diagnosis not present

## 2020-02-24 DIAGNOSIS — N611 Abscess of the breast and nipple: Secondary | ICD-10-CM | POA: Diagnosis not present

## 2020-02-24 DIAGNOSIS — E781 Pure hyperglyceridemia: Secondary | ICD-10-CM | POA: Diagnosis not present

## 2020-02-24 DIAGNOSIS — C3411 Malignant neoplasm of upper lobe, right bronchus or lung: Secondary | ICD-10-CM | POA: Diagnosis not present

## 2020-02-24 DIAGNOSIS — L509 Urticaria, unspecified: Secondary | ICD-10-CM | POA: Diagnosis not present

## 2020-02-24 DIAGNOSIS — Z1211 Encounter for screening for malignant neoplasm of colon: Secondary | ICD-10-CM | POA: Diagnosis not present

## 2020-02-24 DIAGNOSIS — I5189 Other ill-defined heart diseases: Secondary | ICD-10-CM | POA: Diagnosis not present

## 2020-02-24 DIAGNOSIS — R195 Other fecal abnormalities: Secondary | ICD-10-CM | POA: Diagnosis not present

## 2020-02-24 DIAGNOSIS — R1084 Generalized abdominal pain: Secondary | ICD-10-CM | POA: Diagnosis not present

## 2020-02-24 DIAGNOSIS — R011 Cardiac murmur, unspecified: Secondary | ICD-10-CM | POA: Diagnosis not present

## 2020-02-24 DIAGNOSIS — S32592D Other specified fracture of left pubis, subsequent encounter for fracture with routine healing: Secondary | ICD-10-CM | POA: Diagnosis not present

## 2020-02-24 DIAGNOSIS — H02831 Dermatochalasis of right upper eyelid: Secondary | ICD-10-CM | POA: Diagnosis not present

## 2020-02-24 DIAGNOSIS — Z89412 Acquired absence of left great toe: Secondary | ICD-10-CM | POA: Diagnosis not present

## 2020-02-24 DIAGNOSIS — T84020A Dislocation of internal right hip prosthesis, initial encounter: Secondary | ICD-10-CM | POA: Diagnosis not present

## 2020-02-24 DIAGNOSIS — H5340 Unspecified visual field defects: Secondary | ICD-10-CM | POA: Diagnosis not present

## 2020-02-24 DIAGNOSIS — F4320 Adjustment disorder, unspecified: Secondary | ICD-10-CM | POA: Diagnosis not present

## 2020-02-24 DIAGNOSIS — H34823 Venous engorgement, bilateral: Secondary | ICD-10-CM | POA: Diagnosis not present

## 2020-02-24 DIAGNOSIS — Z952 Presence of prosthetic heart valve: Secondary | ICD-10-CM | POA: Diagnosis not present

## 2020-02-24 DIAGNOSIS — C50812 Malignant neoplasm of overlapping sites of left female breast: Secondary | ICD-10-CM | POA: Diagnosis not present

## 2020-02-24 DIAGNOSIS — S43491D Other sprain of right shoulder joint, subsequent encounter: Secondary | ICD-10-CM | POA: Diagnosis not present

## 2020-02-24 DIAGNOSIS — F430 Acute stress reaction: Secondary | ICD-10-CM | POA: Diagnosis not present

## 2020-02-24 DIAGNOSIS — Z1212 Encounter for screening for malignant neoplasm of rectum: Secondary | ICD-10-CM | POA: Diagnosis not present

## 2020-02-24 DIAGNOSIS — E441 Mild protein-calorie malnutrition: Secondary | ICD-10-CM | POA: Diagnosis not present

## 2020-02-24 DIAGNOSIS — M85851 Other specified disorders of bone density and structure, right thigh: Secondary | ICD-10-CM | POA: Diagnosis not present

## 2020-02-24 DIAGNOSIS — E1136 Type 2 diabetes mellitus with diabetic cataract: Secondary | ICD-10-CM | POA: Diagnosis not present

## 2020-02-24 DIAGNOSIS — C50512 Malignant neoplasm of lower-outer quadrant of left female breast: Secondary | ICD-10-CM | POA: Diagnosis not present

## 2020-02-24 DIAGNOSIS — H6121 Impacted cerumen, right ear: Secondary | ICD-10-CM | POA: Diagnosis not present

## 2020-02-24 DIAGNOSIS — E041 Nontoxic single thyroid nodule: Secondary | ICD-10-CM | POA: Diagnosis not present

## 2020-02-24 DIAGNOSIS — Z7901 Long term (current) use of anticoagulants: Secondary | ICD-10-CM | POA: Diagnosis not present

## 2020-02-24 DIAGNOSIS — R52 Pain, unspecified: Secondary | ICD-10-CM | POA: Diagnosis not present

## 2020-02-24 DIAGNOSIS — S233XXA Sprain of ligaments of thoracic spine, initial encounter: Secondary | ICD-10-CM | POA: Diagnosis not present

## 2020-02-24 DIAGNOSIS — Z808 Family history of malignant neoplasm of other organs or systems: Secondary | ICD-10-CM | POA: Diagnosis not present

## 2020-02-24 DIAGNOSIS — Z4689 Encounter for fitting and adjustment of other specified devices: Secondary | ICD-10-CM | POA: Diagnosis not present

## 2020-02-24 DIAGNOSIS — L989 Disorder of the skin and subcutaneous tissue, unspecified: Secondary | ICD-10-CM | POA: Diagnosis not present

## 2020-02-24 DIAGNOSIS — M5410 Radiculopathy, site unspecified: Secondary | ICD-10-CM | POA: Diagnosis not present

## 2020-02-24 DIAGNOSIS — N138 Other obstructive and reflux uropathy: Secondary | ICD-10-CM | POA: Diagnosis not present

## 2020-02-24 DIAGNOSIS — J449 Chronic obstructive pulmonary disease, unspecified: Secondary | ICD-10-CM | POA: Diagnosis not present

## 2020-02-24 DIAGNOSIS — L8944 Pressure ulcer of contiguous site of back, buttock and hip, stage 4: Secondary | ICD-10-CM | POA: Diagnosis not present

## 2020-02-24 DIAGNOSIS — H35722 Serous detachment of retinal pigment epithelium, left eye: Secondary | ICD-10-CM | POA: Diagnosis not present

## 2020-02-24 DIAGNOSIS — Z9289 Personal history of other medical treatment: Secondary | ICD-10-CM | POA: Diagnosis not present

## 2020-02-24 DIAGNOSIS — M7550 Bursitis of unspecified shoulder: Secondary | ICD-10-CM | POA: Diagnosis not present

## 2020-02-24 DIAGNOSIS — H40053 Ocular hypertension, bilateral: Secondary | ICD-10-CM | POA: Diagnosis not present

## 2020-02-24 DIAGNOSIS — F339 Major depressive disorder, recurrent, unspecified: Secondary | ICD-10-CM | POA: Diagnosis not present

## 2020-02-24 DIAGNOSIS — E611 Iron deficiency: Secondary | ICD-10-CM | POA: Diagnosis not present

## 2020-02-24 DIAGNOSIS — G2581 Restless legs syndrome: Secondary | ICD-10-CM | POA: Diagnosis not present

## 2020-02-24 DIAGNOSIS — Z91040 Latex allergy status: Secondary | ICD-10-CM | POA: Diagnosis not present

## 2020-02-24 DIAGNOSIS — M79604 Pain in right leg: Secondary | ICD-10-CM | POA: Diagnosis not present

## 2020-02-24 DIAGNOSIS — R634 Abnormal weight loss: Secondary | ICD-10-CM | POA: Diagnosis not present

## 2020-02-24 DIAGNOSIS — I495 Sick sinus syndrome: Secondary | ICD-10-CM | POA: Diagnosis not present

## 2020-02-24 DIAGNOSIS — J189 Pneumonia, unspecified organism: Secondary | ICD-10-CM | POA: Diagnosis not present

## 2020-02-24 DIAGNOSIS — H353132 Nonexudative age-related macular degeneration, bilateral, intermediate dry stage: Secondary | ICD-10-CM | POA: Diagnosis not present

## 2020-02-24 DIAGNOSIS — R160 Hepatomegaly, not elsewhere classified: Secondary | ICD-10-CM | POA: Diagnosis not present

## 2020-02-24 DIAGNOSIS — I679 Cerebrovascular disease, unspecified: Secondary | ICD-10-CM | POA: Diagnosis not present

## 2020-02-24 DIAGNOSIS — Z0000 Encounter for general adult medical examination without abnormal findings: Secondary | ICD-10-CM | POA: Diagnosis not present

## 2020-02-24 DIAGNOSIS — J45909 Unspecified asthma, uncomplicated: Secondary | ICD-10-CM | POA: Diagnosis not present

## 2020-02-24 DIAGNOSIS — I5033 Acute on chronic diastolic (congestive) heart failure: Secondary | ICD-10-CM | POA: Diagnosis not present

## 2020-02-24 DIAGNOSIS — M79662 Pain in left lower leg: Secondary | ICD-10-CM | POA: Diagnosis not present

## 2020-02-24 DIAGNOSIS — F418 Other specified anxiety disorders: Secondary | ICD-10-CM | POA: Diagnosis not present

## 2020-02-24 DIAGNOSIS — T387X5D Adverse effect of androgens and anabolic congeners, subsequent encounter: Secondary | ICD-10-CM | POA: Diagnosis not present

## 2020-02-24 DIAGNOSIS — M7631 Iliotibial band syndrome, right leg: Secondary | ICD-10-CM | POA: Diagnosis not present

## 2020-02-24 DIAGNOSIS — E1159 Type 2 diabetes mellitus with other circulatory complications: Secondary | ICD-10-CM | POA: Diagnosis not present

## 2020-02-24 DIAGNOSIS — M549 Dorsalgia, unspecified: Secondary | ICD-10-CM | POA: Diagnosis not present

## 2020-02-24 DIAGNOSIS — E222 Syndrome of inappropriate secretion of antidiuretic hormone: Secondary | ICD-10-CM | POA: Diagnosis not present

## 2020-02-24 DIAGNOSIS — I871 Compression of vein: Secondary | ICD-10-CM | POA: Diagnosis not present

## 2020-02-24 DIAGNOSIS — M1710 Unilateral primary osteoarthritis, unspecified knee: Secondary | ICD-10-CM | POA: Diagnosis not present

## 2020-02-24 DIAGNOSIS — G459 Transient cerebral ischemic attack, unspecified: Secondary | ICD-10-CM | POA: Diagnosis not present

## 2020-02-24 DIAGNOSIS — S0003XA Contusion of scalp, initial encounter: Secondary | ICD-10-CM | POA: Diagnosis not present

## 2020-02-24 DIAGNOSIS — K635 Polyp of colon: Secondary | ICD-10-CM | POA: Diagnosis not present

## 2020-02-24 DIAGNOSIS — B372 Candidiasis of skin and nail: Secondary | ICD-10-CM | POA: Diagnosis not present

## 2020-02-24 DIAGNOSIS — Z963 Presence of artificial larynx: Secondary | ICD-10-CM | POA: Diagnosis not present

## 2020-02-24 DIAGNOSIS — F5102 Adjustment insomnia: Secondary | ICD-10-CM | POA: Diagnosis not present

## 2020-02-24 DIAGNOSIS — H7011 Chronic mastoiditis, right ear: Secondary | ICD-10-CM | POA: Diagnosis not present

## 2020-02-24 DIAGNOSIS — N179 Acute kidney failure, unspecified: Secondary | ICD-10-CM | POA: Diagnosis not present

## 2020-02-24 DIAGNOSIS — K625 Hemorrhage of anus and rectum: Secondary | ICD-10-CM | POA: Diagnosis not present

## 2020-02-24 DIAGNOSIS — F332 Major depressive disorder, recurrent severe without psychotic features: Secondary | ICD-10-CM | POA: Diagnosis not present

## 2020-02-24 DIAGNOSIS — M999 Biomechanical lesion, unspecified: Secondary | ICD-10-CM | POA: Diagnosis not present

## 2020-02-24 DIAGNOSIS — N819 Female genital prolapse, unspecified: Secondary | ICD-10-CM | POA: Diagnosis not present

## 2020-02-24 DIAGNOSIS — N870 Mild cervical dysplasia: Secondary | ICD-10-CM | POA: Diagnosis not present

## 2020-02-24 DIAGNOSIS — I300 Acute nonspecific idiopathic pericarditis: Secondary | ICD-10-CM | POA: Diagnosis not present

## 2020-02-24 DIAGNOSIS — Z9119 Patient's noncompliance with other medical treatment and regimen: Secondary | ICD-10-CM | POA: Diagnosis not present

## 2020-02-24 DIAGNOSIS — M47817 Spondylosis without myelopathy or radiculopathy, lumbosacral region: Secondary | ICD-10-CM | POA: Diagnosis not present

## 2020-02-24 DIAGNOSIS — J9611 Chronic respiratory failure with hypoxia: Secondary | ICD-10-CM | POA: Diagnosis not present

## 2020-02-24 DIAGNOSIS — F17200 Nicotine dependence, unspecified, uncomplicated: Secondary | ICD-10-CM | POA: Diagnosis not present

## 2020-02-24 DIAGNOSIS — G9340 Encephalopathy, unspecified: Secondary | ICD-10-CM | POA: Diagnosis not present

## 2020-02-24 DIAGNOSIS — S41101D Unspecified open wound of right upper arm, subsequent encounter: Secondary | ICD-10-CM | POA: Diagnosis not present

## 2020-02-24 DIAGNOSIS — H43811 Vitreous degeneration, right eye: Secondary | ICD-10-CM | POA: Diagnosis not present

## 2020-02-24 DIAGNOSIS — E11311 Type 2 diabetes mellitus with unspecified diabetic retinopathy with macular edema: Secondary | ICD-10-CM | POA: Diagnosis not present

## 2020-02-24 DIAGNOSIS — Z205 Contact with and (suspected) exposure to viral hepatitis: Secondary | ICD-10-CM | POA: Diagnosis not present

## 2020-02-24 DIAGNOSIS — R54 Age-related physical debility: Secondary | ICD-10-CM | POA: Diagnosis not present

## 2020-02-24 DIAGNOSIS — I639 Cerebral infarction, unspecified: Secondary | ICD-10-CM | POA: Diagnosis not present

## 2020-02-24 DIAGNOSIS — Z950 Presence of cardiac pacemaker: Secondary | ICD-10-CM | POA: Diagnosis not present

## 2020-02-24 DIAGNOSIS — Z6829 Body mass index (BMI) 29.0-29.9, adult: Secondary | ICD-10-CM | POA: Diagnosis not present

## 2020-02-24 DIAGNOSIS — S46001D Unspecified injury of muscle(s) and tendon(s) of the rotator cuff of right shoulder, subsequent encounter: Secondary | ICD-10-CM | POA: Diagnosis not present

## 2020-02-24 DIAGNOSIS — C61 Malignant neoplasm of prostate: Secondary | ICD-10-CM | POA: Diagnosis not present

## 2020-02-24 DIAGNOSIS — B258 Other cytomegaloviral diseases: Secondary | ICD-10-CM | POA: Diagnosis not present

## 2020-02-24 DIAGNOSIS — M720 Palmar fascial fibromatosis [Dupuytren]: Secondary | ICD-10-CM | POA: Diagnosis not present

## 2020-02-24 DIAGNOSIS — M7072 Other bursitis of hip, left hip: Secondary | ICD-10-CM | POA: Diagnosis not present

## 2020-02-24 DIAGNOSIS — M899 Disorder of bone, unspecified: Secondary | ICD-10-CM | POA: Diagnosis not present

## 2020-02-24 DIAGNOSIS — E211 Secondary hyperparathyroidism, not elsewhere classified: Secondary | ICD-10-CM | POA: Diagnosis not present

## 2020-02-24 DIAGNOSIS — M7741 Metatarsalgia, right foot: Secondary | ICD-10-CM | POA: Diagnosis not present

## 2020-02-24 DIAGNOSIS — R040 Epistaxis: Secondary | ICD-10-CM | POA: Diagnosis not present

## 2020-02-24 DIAGNOSIS — Z01810 Encounter for preprocedural cardiovascular examination: Secondary | ICD-10-CM | POA: Diagnosis not present

## 2020-02-24 DIAGNOSIS — Z79890 Hormone replacement therapy (postmenopausal): Secondary | ICD-10-CM | POA: Diagnosis not present

## 2020-02-24 DIAGNOSIS — Z96652 Presence of left artificial knee joint: Secondary | ICD-10-CM | POA: Diagnosis not present

## 2020-02-24 DIAGNOSIS — K229 Disease of esophagus, unspecified: Secondary | ICD-10-CM | POA: Diagnosis not present

## 2020-02-24 DIAGNOSIS — I82401 Acute embolism and thrombosis of unspecified deep veins of right lower extremity: Secondary | ICD-10-CM | POA: Diagnosis not present

## 2020-02-24 DIAGNOSIS — R194 Change in bowel habit: Secondary | ICD-10-CM | POA: Diagnosis not present

## 2020-02-24 DIAGNOSIS — K7460 Unspecified cirrhosis of liver: Secondary | ICD-10-CM | POA: Diagnosis not present

## 2020-02-24 DIAGNOSIS — G6289 Other specified polyneuropathies: Secondary | ICD-10-CM | POA: Diagnosis not present

## 2020-02-24 DIAGNOSIS — Z940 Kidney transplant status: Secondary | ICD-10-CM | POA: Diagnosis not present

## 2020-02-24 DIAGNOSIS — M25641 Stiffness of right hand, not elsewhere classified: Secondary | ICD-10-CM | POA: Diagnosis not present

## 2020-02-24 DIAGNOSIS — R399 Unspecified symptoms and signs involving the genitourinary system: Secondary | ICD-10-CM | POA: Diagnosis not present

## 2020-02-24 DIAGNOSIS — Z6834 Body mass index (BMI) 34.0-34.9, adult: Secondary | ICD-10-CM | POA: Diagnosis not present

## 2020-02-24 DIAGNOSIS — E1122 Type 2 diabetes mellitus with diabetic chronic kidney disease: Secondary | ICD-10-CM | POA: Diagnosis not present

## 2020-02-24 DIAGNOSIS — R102 Pelvic and perineal pain: Secondary | ICD-10-CM | POA: Diagnosis not present

## 2020-02-24 DIAGNOSIS — R937 Abnormal findings on diagnostic imaging of other parts of musculoskeletal system: Secondary | ICD-10-CM | POA: Diagnosis not present

## 2020-02-24 DIAGNOSIS — R0982 Postnasal drip: Secondary | ICD-10-CM | POA: Diagnosis not present

## 2020-02-24 DIAGNOSIS — S42291A Other displaced fracture of upper end of right humerus, initial encounter for closed fracture: Secondary | ICD-10-CM | POA: Diagnosis not present

## 2020-02-24 DIAGNOSIS — Z811 Family history of alcohol abuse and dependence: Secondary | ICD-10-CM | POA: Diagnosis not present

## 2020-02-24 DIAGNOSIS — Z8709 Personal history of other diseases of the respiratory system: Secondary | ICD-10-CM | POA: Diagnosis not present

## 2020-02-24 DIAGNOSIS — M545 Low back pain: Secondary | ICD-10-CM | POA: Diagnosis not present

## 2020-02-24 DIAGNOSIS — N35919 Unspecified urethral stricture, male, unspecified site: Secondary | ICD-10-CM | POA: Diagnosis not present

## 2020-02-24 DIAGNOSIS — H04412 Chronic dacryocystitis of left lacrimal passage: Secondary | ICD-10-CM | POA: Diagnosis not present

## 2020-02-24 DIAGNOSIS — K6289 Other specified diseases of anus and rectum: Secondary | ICD-10-CM | POA: Diagnosis not present

## 2020-02-24 DIAGNOSIS — M7742 Metatarsalgia, left foot: Secondary | ICD-10-CM | POA: Diagnosis not present

## 2020-02-24 DIAGNOSIS — E786 Lipoprotein deficiency: Secondary | ICD-10-CM | POA: Diagnosis not present

## 2020-02-24 DIAGNOSIS — K640 First degree hemorrhoids: Secondary | ICD-10-CM | POA: Diagnosis not present

## 2020-02-24 DIAGNOSIS — M47816 Spondylosis without myelopathy or radiculopathy, lumbar region: Secondary | ICD-10-CM | POA: Diagnosis not present

## 2020-02-24 DIAGNOSIS — H52223 Regular astigmatism, bilateral: Secondary | ICD-10-CM | POA: Diagnosis not present

## 2020-02-24 DIAGNOSIS — Z923 Personal history of irradiation: Secondary | ICD-10-CM | POA: Diagnosis not present

## 2020-02-24 DIAGNOSIS — J22 Unspecified acute lower respiratory infection: Secondary | ICD-10-CM | POA: Diagnosis not present

## 2020-02-24 DIAGNOSIS — R635 Abnormal weight gain: Secondary | ICD-10-CM | POA: Diagnosis not present

## 2020-02-24 DIAGNOSIS — Z87898 Personal history of other specified conditions: Secondary | ICD-10-CM | POA: Diagnosis not present

## 2020-02-24 DIAGNOSIS — Z8616 Personal history of COVID-19: Secondary | ICD-10-CM | POA: Diagnosis not present

## 2020-02-24 DIAGNOSIS — R1909 Other intra-abdominal and pelvic swelling, mass and lump: Secondary | ICD-10-CM | POA: Diagnosis not present

## 2020-02-24 DIAGNOSIS — G4710 Hypersomnia, unspecified: Secondary | ICD-10-CM | POA: Diagnosis not present

## 2020-02-24 DIAGNOSIS — G8911 Acute pain due to trauma: Secondary | ICD-10-CM | POA: Diagnosis not present

## 2020-02-24 DIAGNOSIS — C44519 Basal cell carcinoma of skin of other part of trunk: Secondary | ICD-10-CM | POA: Diagnosis not present

## 2020-02-24 DIAGNOSIS — R0600 Dyspnea, unspecified: Secondary | ICD-10-CM | POA: Diagnosis not present

## 2020-02-24 DIAGNOSIS — D1801 Hemangioma of skin and subcutaneous tissue: Secondary | ICD-10-CM | POA: Diagnosis not present

## 2020-02-24 DIAGNOSIS — R45851 Suicidal ideations: Secondary | ICD-10-CM | POA: Diagnosis not present

## 2020-02-24 DIAGNOSIS — Z6826 Body mass index (BMI) 26.0-26.9, adult: Secondary | ICD-10-CM | POA: Diagnosis not present

## 2020-02-24 DIAGNOSIS — S52502A Unspecified fracture of the lower end of left radius, initial encounter for closed fracture: Secondary | ICD-10-CM | POA: Diagnosis not present

## 2020-02-24 DIAGNOSIS — S7291XA Unspecified fracture of right femur, initial encounter for closed fracture: Secondary | ICD-10-CM | POA: Diagnosis not present

## 2020-02-24 DIAGNOSIS — D127 Benign neoplasm of rectosigmoid junction: Secondary | ICD-10-CM | POA: Diagnosis not present

## 2020-02-24 DIAGNOSIS — C50912 Malignant neoplasm of unspecified site of left female breast: Secondary | ICD-10-CM | POA: Diagnosis not present

## 2020-02-24 DIAGNOSIS — T85848D Pain due to other internal prosthetic devices, implants and grafts, subsequent encounter: Secondary | ICD-10-CM | POA: Diagnosis not present

## 2020-02-24 DIAGNOSIS — S8992XA Unspecified injury of left lower leg, initial encounter: Secondary | ICD-10-CM | POA: Diagnosis not present

## 2020-02-24 DIAGNOSIS — I69398 Other sequelae of cerebral infarction: Secondary | ICD-10-CM | POA: Diagnosis not present

## 2020-02-24 DIAGNOSIS — M5134 Other intervertebral disc degeneration, thoracic region: Secondary | ICD-10-CM | POA: Diagnosis not present

## 2020-02-24 DIAGNOSIS — F79 Unspecified intellectual disabilities: Secondary | ICD-10-CM | POA: Diagnosis not present

## 2020-02-24 DIAGNOSIS — R82998 Other abnormal findings in urine: Secondary | ICD-10-CM | POA: Diagnosis not present

## 2020-02-24 DIAGNOSIS — E43 Unspecified severe protein-calorie malnutrition: Secondary | ICD-10-CM | POA: Diagnosis not present

## 2020-02-24 DIAGNOSIS — I9589 Other hypotension: Secondary | ICD-10-CM | POA: Diagnosis not present

## 2020-02-24 DIAGNOSIS — S32010D Wedge compression fracture of first lumbar vertebra, subsequent encounter for fracture with routine healing: Secondary | ICD-10-CM | POA: Diagnosis not present

## 2020-02-24 DIAGNOSIS — M810 Age-related osteoporosis without current pathological fracture: Secondary | ICD-10-CM | POA: Diagnosis not present

## 2020-02-24 DIAGNOSIS — N61 Mastitis without abscess: Secondary | ICD-10-CM | POA: Diagnosis not present

## 2020-02-24 DIAGNOSIS — Z9889 Other specified postprocedural states: Secondary | ICD-10-CM | POA: Diagnosis not present

## 2020-02-24 DIAGNOSIS — M75102 Unspecified rotator cuff tear or rupture of left shoulder, not specified as traumatic: Secondary | ICD-10-CM | POA: Diagnosis not present

## 2020-02-24 DIAGNOSIS — M4726 Other spondylosis with radiculopathy, lumbar region: Secondary | ICD-10-CM | POA: Diagnosis not present

## 2020-02-24 DIAGNOSIS — F10232 Alcohol dependence with withdrawal with perceptual disturbance: Secondary | ICD-10-CM | POA: Diagnosis not present

## 2020-02-24 DIAGNOSIS — J61 Pneumoconiosis due to asbestos and other mineral fibers: Secondary | ICD-10-CM | POA: Diagnosis not present

## 2020-02-24 DIAGNOSIS — F988 Other specified behavioral and emotional disorders with onset usually occurring in childhood and adolescence: Secondary | ICD-10-CM | POA: Diagnosis not present

## 2020-02-24 DIAGNOSIS — R296 Repeated falls: Secondary | ICD-10-CM | POA: Diagnosis not present

## 2020-02-24 DIAGNOSIS — I4811 Longstanding persistent atrial fibrillation: Secondary | ICD-10-CM | POA: Diagnosis not present

## 2020-02-24 DIAGNOSIS — N3281 Overactive bladder: Secondary | ICD-10-CM | POA: Diagnosis not present

## 2020-02-24 DIAGNOSIS — K5080 Crohn's disease of both small and large intestine without complications: Secondary | ICD-10-CM | POA: Diagnosis not present

## 2020-02-24 DIAGNOSIS — R911 Solitary pulmonary nodule: Secondary | ICD-10-CM | POA: Diagnosis not present

## 2020-02-24 DIAGNOSIS — I2510 Atherosclerotic heart disease of native coronary artery without angina pectoris: Secondary | ICD-10-CM | POA: Diagnosis not present

## 2020-02-24 DIAGNOSIS — L814 Other melanin hyperpigmentation: Secondary | ICD-10-CM | POA: Diagnosis not present

## 2020-02-24 DIAGNOSIS — K829 Disease of gallbladder, unspecified: Secondary | ICD-10-CM | POA: Diagnosis not present

## 2020-02-24 DIAGNOSIS — A491 Streptococcal infection, unspecified site: Secondary | ICD-10-CM | POA: Diagnosis not present

## 2020-02-24 DIAGNOSIS — E118 Type 2 diabetes mellitus with unspecified complications: Secondary | ICD-10-CM | POA: Diagnosis not present

## 2020-02-24 DIAGNOSIS — C19 Malignant neoplasm of rectosigmoid junction: Secondary | ICD-10-CM | POA: Diagnosis not present

## 2020-02-24 DIAGNOSIS — I6310 Cerebral infarction due to embolism of unspecified precerebral artery: Secondary | ICD-10-CM | POA: Diagnosis not present

## 2020-02-24 DIAGNOSIS — R110 Nausea: Secondary | ICD-10-CM | POA: Diagnosis not present

## 2020-02-24 DIAGNOSIS — I63411 Cerebral infarction due to embolism of right middle cerebral artery: Secondary | ICD-10-CM | POA: Diagnosis not present

## 2020-02-24 DIAGNOSIS — D649 Anemia, unspecified: Secondary | ICD-10-CM | POA: Diagnosis not present

## 2020-02-24 DIAGNOSIS — Z87412 Personal history of vulvar dysplasia: Secondary | ICD-10-CM | POA: Diagnosis not present

## 2020-02-24 DIAGNOSIS — H518 Other specified disorders of binocular movement: Secondary | ICD-10-CM | POA: Diagnosis not present

## 2020-02-24 DIAGNOSIS — N471 Phimosis: Secondary | ICD-10-CM | POA: Diagnosis not present

## 2020-02-24 DIAGNOSIS — I70213 Atherosclerosis of native arteries of extremities with intermittent claudication, bilateral legs: Secondary | ICD-10-CM | POA: Diagnosis not present

## 2020-02-24 DIAGNOSIS — H35372 Puckering of macula, left eye: Secondary | ICD-10-CM | POA: Diagnosis not present

## 2020-02-24 DIAGNOSIS — L600 Ingrowing nail: Secondary | ICD-10-CM | POA: Diagnosis not present

## 2020-02-24 DIAGNOSIS — Z888 Allergy status to other drugs, medicaments and biological substances status: Secondary | ICD-10-CM | POA: Diagnosis not present

## 2020-02-24 DIAGNOSIS — R2689 Other abnormalities of gait and mobility: Secondary | ICD-10-CM | POA: Diagnosis not present

## 2020-02-24 DIAGNOSIS — H353231 Exudative age-related macular degeneration, bilateral, with active choroidal neovascularization: Secondary | ICD-10-CM | POA: Diagnosis not present

## 2020-02-24 DIAGNOSIS — M791 Myalgia, unspecified site: Secondary | ICD-10-CM | POA: Diagnosis not present

## 2020-02-24 DIAGNOSIS — J300 Vasomotor rhinitis: Secondary | ICD-10-CM | POA: Diagnosis not present

## 2020-02-24 DIAGNOSIS — S3991XA Unspecified injury of abdomen, initial encounter: Secondary | ICD-10-CM | POA: Diagnosis not present

## 2020-02-24 DIAGNOSIS — B951 Streptococcus, group B, as the cause of diseases classified elsewhere: Secondary | ICD-10-CM | POA: Diagnosis not present

## 2020-02-24 DIAGNOSIS — R509 Fever, unspecified: Secondary | ICD-10-CM | POA: Diagnosis not present

## 2020-02-24 DIAGNOSIS — M5033 Other cervical disc degeneration, cervicothoracic region: Secondary | ICD-10-CM | POA: Diagnosis not present

## 2020-02-24 DIAGNOSIS — I129 Hypertensive chronic kidney disease with stage 1 through stage 4 chronic kidney disease, or unspecified chronic kidney disease: Secondary | ICD-10-CM | POA: Diagnosis not present

## 2020-02-24 DIAGNOSIS — T402X5A Adverse effect of other opioids, initial encounter: Secondary | ICD-10-CM | POA: Diagnosis not present

## 2020-02-24 DIAGNOSIS — Z87891 Personal history of nicotine dependence: Secondary | ICD-10-CM | POA: Diagnosis not present

## 2020-02-24 DIAGNOSIS — M5430 Sciatica, unspecified side: Secondary | ICD-10-CM | POA: Diagnosis not present

## 2020-02-24 DIAGNOSIS — Z1331 Encounter for screening for depression: Secondary | ICD-10-CM | POA: Diagnosis not present

## 2020-02-24 DIAGNOSIS — H52203 Unspecified astigmatism, bilateral: Secondary | ICD-10-CM | POA: Diagnosis not present

## 2020-02-24 DIAGNOSIS — Z96643 Presence of artificial hip joint, bilateral: Secondary | ICD-10-CM | POA: Diagnosis not present

## 2020-02-24 DIAGNOSIS — M5030 Other cervical disc degeneration, unspecified cervical region: Secondary | ICD-10-CM | POA: Diagnosis not present

## 2020-02-24 DIAGNOSIS — J668 Airway disease due to other specific organic dusts: Secondary | ICD-10-CM | POA: Diagnosis not present

## 2020-02-24 DIAGNOSIS — C44729 Squamous cell carcinoma of skin of left lower limb, including hip: Secondary | ICD-10-CM | POA: Diagnosis not present

## 2020-02-24 DIAGNOSIS — R1013 Epigastric pain: Secondary | ICD-10-CM | POA: Diagnosis not present

## 2020-02-24 DIAGNOSIS — K219 Gastro-esophageal reflux disease without esophagitis: Secondary | ICD-10-CM | POA: Diagnosis not present

## 2020-02-24 DIAGNOSIS — S92323A Displaced fracture of second metatarsal bone, unspecified foot, initial encounter for closed fracture: Secondary | ICD-10-CM | POA: Diagnosis not present

## 2020-02-24 DIAGNOSIS — Z136 Encounter for screening for cardiovascular disorders: Secondary | ICD-10-CM | POA: Diagnosis not present

## 2020-02-24 DIAGNOSIS — C50411 Malignant neoplasm of upper-outer quadrant of right female breast: Secondary | ICD-10-CM | POA: Diagnosis not present

## 2020-02-24 DIAGNOSIS — R1030 Lower abdominal pain, unspecified: Secondary | ICD-10-CM | POA: Diagnosis not present

## 2020-02-24 DIAGNOSIS — H353131 Nonexudative age-related macular degeneration, bilateral, early dry stage: Secondary | ICD-10-CM | POA: Diagnosis not present

## 2020-02-24 DIAGNOSIS — Z7951 Long term (current) use of inhaled steroids: Secondary | ICD-10-CM | POA: Diagnosis not present

## 2020-02-24 DIAGNOSIS — R197 Diarrhea, unspecified: Secondary | ICD-10-CM | POA: Diagnosis not present

## 2020-02-24 DIAGNOSIS — Z8781 Personal history of (healed) traumatic fracture: Secondary | ICD-10-CM | POA: Diagnosis not present

## 2020-02-24 DIAGNOSIS — G8194 Hemiplegia, unspecified affecting left nondominant side: Secondary | ICD-10-CM | POA: Diagnosis not present

## 2020-02-24 DIAGNOSIS — I4891 Unspecified atrial fibrillation: Secondary | ICD-10-CM | POA: Diagnosis not present

## 2020-02-24 DIAGNOSIS — M7522 Bicipital tendinitis, left shoulder: Secondary | ICD-10-CM | POA: Diagnosis not present

## 2020-02-24 DIAGNOSIS — E1065 Type 1 diabetes mellitus with hyperglycemia: Secondary | ICD-10-CM | POA: Diagnosis not present

## 2020-02-24 DIAGNOSIS — Z299 Encounter for prophylactic measures, unspecified: Secondary | ICD-10-CM | POA: Diagnosis not present

## 2020-02-24 DIAGNOSIS — J40 Bronchitis, not specified as acute or chronic: Secondary | ICD-10-CM | POA: Diagnosis not present

## 2020-02-24 DIAGNOSIS — D510 Vitamin B12 deficiency anemia due to intrinsic factor deficiency: Secondary | ICD-10-CM | POA: Diagnosis not present

## 2020-02-24 DIAGNOSIS — T84028A Dislocation of other internal joint prosthesis, initial encounter: Secondary | ICD-10-CM | POA: Diagnosis not present

## 2020-02-24 DIAGNOSIS — M949 Disorder of cartilage, unspecified: Secondary | ICD-10-CM | POA: Diagnosis not present

## 2020-02-24 DIAGNOSIS — M160 Bilateral primary osteoarthritis of hip: Secondary | ICD-10-CM | POA: Diagnosis not present

## 2020-02-24 DIAGNOSIS — M4316 Spondylolisthesis, lumbar region: Secondary | ICD-10-CM | POA: Diagnosis not present

## 2020-02-24 DIAGNOSIS — I517 Cardiomegaly: Secondary | ICD-10-CM | POA: Diagnosis not present

## 2020-02-24 DIAGNOSIS — K3184 Gastroparesis: Secondary | ICD-10-CM | POA: Diagnosis not present

## 2020-02-24 DIAGNOSIS — Z4789 Encounter for other orthopedic aftercare: Secondary | ICD-10-CM | POA: Diagnosis not present

## 2020-02-24 DIAGNOSIS — I440 Atrioventricular block, first degree: Secondary | ICD-10-CM | POA: Diagnosis not present

## 2020-02-24 DIAGNOSIS — W19XXXD Unspecified fall, subsequent encounter: Secondary | ICD-10-CM | POA: Diagnosis not present

## 2020-02-24 DIAGNOSIS — S82831A Other fracture of upper and lower end of right fibula, initial encounter for closed fracture: Secondary | ICD-10-CM | POA: Diagnosis not present

## 2020-02-24 DIAGNOSIS — R221 Localized swelling, mass and lump, neck: Secondary | ICD-10-CM | POA: Diagnosis not present

## 2020-02-24 DIAGNOSIS — R079 Chest pain, unspecified: Secondary | ICD-10-CM | POA: Diagnosis not present

## 2020-02-24 DIAGNOSIS — K9089 Other intestinal malabsorption: Secondary | ICD-10-CM | POA: Diagnosis not present

## 2020-02-24 DIAGNOSIS — Z08 Encounter for follow-up examination after completed treatment for malignant neoplasm: Secondary | ICD-10-CM | POA: Diagnosis not present

## 2020-02-24 DIAGNOSIS — I5031 Acute diastolic (congestive) heart failure: Secondary | ICD-10-CM | POA: Diagnosis not present

## 2020-02-24 DIAGNOSIS — C7951 Secondary malignant neoplasm of bone: Secondary | ICD-10-CM | POA: Diagnosis not present

## 2020-02-24 DIAGNOSIS — H608X2 Other otitis externa, left ear: Secondary | ICD-10-CM | POA: Diagnosis not present

## 2020-02-24 DIAGNOSIS — M62552 Muscle wasting and atrophy, not elsewhere classified, left thigh: Secondary | ICD-10-CM | POA: Diagnosis not present

## 2020-02-24 DIAGNOSIS — J320 Chronic maxillary sinusitis: Secondary | ICD-10-CM | POA: Diagnosis not present

## 2020-02-24 DIAGNOSIS — L97921 Non-pressure chronic ulcer of unspecified part of left lower leg limited to breakdown of skin: Secondary | ICD-10-CM | POA: Diagnosis not present

## 2020-02-24 DIAGNOSIS — M4712 Other spondylosis with myelopathy, cervical region: Secondary | ICD-10-CM | POA: Diagnosis not present

## 2020-02-24 DIAGNOSIS — I953 Hypotension of hemodialysis: Secondary | ICD-10-CM | POA: Diagnosis not present

## 2020-02-24 DIAGNOSIS — F419 Anxiety disorder, unspecified: Secondary | ICD-10-CM | POA: Diagnosis not present

## 2020-02-24 DIAGNOSIS — C3492 Malignant neoplasm of unspecified part of left bronchus or lung: Secondary | ICD-10-CM | POA: Diagnosis not present

## 2020-02-24 DIAGNOSIS — H9313 Tinnitus, bilateral: Secondary | ICD-10-CM | POA: Diagnosis not present

## 2020-02-24 DIAGNOSIS — L905 Scar conditions and fibrosis of skin: Secondary | ICD-10-CM | POA: Diagnosis not present

## 2020-02-24 DIAGNOSIS — Z8371 Family history of colonic polyps: Secondary | ICD-10-CM | POA: Diagnosis not present

## 2020-02-24 DIAGNOSIS — K5790 Diverticulosis of intestine, part unspecified, without perforation or abscess without bleeding: Secondary | ICD-10-CM | POA: Diagnosis not present

## 2020-02-24 DIAGNOSIS — R310 Gross hematuria: Secondary | ICD-10-CM | POA: Diagnosis not present

## 2020-02-24 DIAGNOSIS — H401132 Primary open-angle glaucoma, bilateral, moderate stage: Secondary | ICD-10-CM | POA: Diagnosis not present

## 2020-02-24 DIAGNOSIS — R2681 Unsteadiness on feet: Secondary | ICD-10-CM | POA: Diagnosis not present

## 2020-02-24 DIAGNOSIS — Z01419 Encounter for gynecological examination (general) (routine) without abnormal findings: Secondary | ICD-10-CM | POA: Diagnosis not present

## 2020-02-24 DIAGNOSIS — I83029 Varicose veins of left lower extremity with ulcer of unspecified site: Secondary | ICD-10-CM | POA: Diagnosis not present

## 2020-02-24 DIAGNOSIS — Z79891 Long term (current) use of opiate analgesic: Secondary | ICD-10-CM | POA: Diagnosis not present

## 2020-02-24 DIAGNOSIS — L603 Nail dystrophy: Secondary | ICD-10-CM | POA: Diagnosis not present

## 2020-02-24 DIAGNOSIS — N200 Calculus of kidney: Secondary | ICD-10-CM | POA: Diagnosis not present

## 2020-02-24 DIAGNOSIS — D124 Benign neoplasm of descending colon: Secondary | ICD-10-CM | POA: Diagnosis not present

## 2020-02-24 DIAGNOSIS — D485 Neoplasm of uncertain behavior of skin: Secondary | ICD-10-CM | POA: Diagnosis not present

## 2020-02-24 DIAGNOSIS — E8881 Metabolic syndrome: Secondary | ICD-10-CM | POA: Diagnosis not present

## 2020-02-24 DIAGNOSIS — Z13220 Encounter for screening for lipoid disorders: Secondary | ICD-10-CM | POA: Diagnosis not present

## 2020-02-24 DIAGNOSIS — R600 Localized edema: Secondary | ICD-10-CM | POA: Diagnosis not present

## 2020-02-24 DIAGNOSIS — H11823 Conjunctivochalasis, bilateral: Secondary | ICD-10-CM | POA: Diagnosis not present

## 2020-02-24 DIAGNOSIS — G43719 Chronic migraine without aura, intractable, without status migrainosus: Secondary | ICD-10-CM | POA: Diagnosis not present

## 2020-02-24 DIAGNOSIS — M1712 Unilateral primary osteoarthritis, left knee: Secondary | ICD-10-CM | POA: Diagnosis not present

## 2020-02-24 DIAGNOSIS — J929 Pleural plaque without asbestos: Secondary | ICD-10-CM | POA: Diagnosis not present

## 2020-02-24 DIAGNOSIS — Z6825 Body mass index (BMI) 25.0-25.9, adult: Secondary | ICD-10-CM | POA: Diagnosis not present

## 2020-02-24 DIAGNOSIS — R410 Disorientation, unspecified: Secondary | ICD-10-CM | POA: Diagnosis not present

## 2020-02-24 DIAGNOSIS — Z9013 Acquired absence of bilateral breasts and nipples: Secondary | ICD-10-CM | POA: Diagnosis not present

## 2020-02-24 DIAGNOSIS — H348312 Tributary (branch) retinal vein occlusion, right eye, stable: Secondary | ICD-10-CM | POA: Diagnosis not present

## 2020-02-24 DIAGNOSIS — E875 Hyperkalemia: Secondary | ICD-10-CM | POA: Diagnosis not present

## 2020-02-24 DIAGNOSIS — L720 Epidermal cyst: Secondary | ICD-10-CM | POA: Diagnosis not present

## 2020-02-24 DIAGNOSIS — E790 Hyperuricemia without signs of inflammatory arthritis and tophaceous disease: Secondary | ICD-10-CM | POA: Diagnosis not present

## 2020-02-24 DIAGNOSIS — G9341 Metabolic encephalopathy: Secondary | ICD-10-CM | POA: Diagnosis not present

## 2020-02-25 DIAGNOSIS — K8020 Calculus of gallbladder without cholecystitis without obstruction: Secondary | ICD-10-CM | POA: Diagnosis not present

## 2020-02-25 DIAGNOSIS — N39498 Other specified urinary incontinence: Secondary | ICD-10-CM | POA: Diagnosis not present

## 2020-02-25 DIAGNOSIS — G5603 Carpal tunnel syndrome, bilateral upper limbs: Secondary | ICD-10-CM | POA: Diagnosis not present

## 2020-02-25 DIAGNOSIS — U071 COVID-19: Secondary | ICD-10-CM | POA: Diagnosis not present

## 2020-02-25 DIAGNOSIS — M5441 Lumbago with sciatica, right side: Secondary | ICD-10-CM | POA: Diagnosis not present

## 2020-02-25 DIAGNOSIS — L0201 Cutaneous abscess of face: Secondary | ICD-10-CM | POA: Diagnosis not present

## 2020-02-25 DIAGNOSIS — B353 Tinea pedis: Secondary | ICD-10-CM | POA: Diagnosis not present

## 2020-02-25 DIAGNOSIS — H1045 Other chronic allergic conjunctivitis: Secondary | ICD-10-CM | POA: Diagnosis not present

## 2020-02-25 DIAGNOSIS — D529 Folate deficiency anemia, unspecified: Secondary | ICD-10-CM | POA: Diagnosis not present

## 2020-02-25 DIAGNOSIS — J9611 Chronic respiratory failure with hypoxia: Secondary | ICD-10-CM | POA: Diagnosis not present

## 2020-02-25 DIAGNOSIS — J470 Bronchiectasis with acute lower respiratory infection: Secondary | ICD-10-CM | POA: Diagnosis not present

## 2020-02-25 DIAGNOSIS — Z66 Do not resuscitate: Secondary | ICD-10-CM | POA: Diagnosis not present

## 2020-02-25 DIAGNOSIS — E1139 Type 2 diabetes mellitus with other diabetic ophthalmic complication: Secondary | ICD-10-CM | POA: Diagnosis not present

## 2020-02-25 DIAGNOSIS — E1121 Type 2 diabetes mellitus with diabetic nephropathy: Secondary | ICD-10-CM | POA: Diagnosis not present

## 2020-02-25 DIAGNOSIS — G20 Parkinson's disease: Secondary | ICD-10-CM | POA: Diagnosis not present

## 2020-02-25 DIAGNOSIS — R4189 Other symptoms and signs involving cognitive functions and awareness: Secondary | ICD-10-CM | POA: Diagnosis not present

## 2020-02-25 DIAGNOSIS — F17200 Nicotine dependence, unspecified, uncomplicated: Secondary | ICD-10-CM | POA: Diagnosis not present

## 2020-02-25 DIAGNOSIS — Z96652 Presence of left artificial knee joint: Secondary | ICD-10-CM | POA: Diagnosis not present

## 2020-02-25 DIAGNOSIS — M79643 Pain in unspecified hand: Secondary | ICD-10-CM | POA: Diagnosis not present

## 2020-02-25 DIAGNOSIS — H16223 Keratoconjunctivitis sicca, not specified as Sjogren's, bilateral: Secondary | ICD-10-CM | POA: Diagnosis not present

## 2020-02-25 DIAGNOSIS — K31811 Angiodysplasia of stomach and duodenum with bleeding: Secondary | ICD-10-CM | POA: Diagnosis not present

## 2020-02-25 DIAGNOSIS — Z6822 Body mass index (BMI) 22.0-22.9, adult: Secondary | ICD-10-CM | POA: Diagnosis not present

## 2020-02-25 DIAGNOSIS — E7800 Pure hypercholesterolemia, unspecified: Secondary | ICD-10-CM | POA: Diagnosis not present

## 2020-02-25 DIAGNOSIS — I255 Ischemic cardiomyopathy: Secondary | ICD-10-CM | POA: Diagnosis not present

## 2020-02-25 DIAGNOSIS — G43909 Migraine, unspecified, not intractable, without status migrainosus: Secondary | ICD-10-CM | POA: Diagnosis not present

## 2020-02-25 DIAGNOSIS — C9310 Chronic myelomonocytic leukemia not having achieved remission: Secondary | ICD-10-CM | POA: Diagnosis not present

## 2020-02-25 DIAGNOSIS — M2021 Hallux rigidus, right foot: Secondary | ICD-10-CM | POA: Diagnosis not present

## 2020-02-25 DIAGNOSIS — Z9484 Stem cells transplant status: Secondary | ICD-10-CM | POA: Diagnosis not present

## 2020-02-25 DIAGNOSIS — Z461 Encounter for fitting and adjustment of hearing aid: Secondary | ICD-10-CM | POA: Diagnosis not present

## 2020-02-25 DIAGNOSIS — L578 Other skin changes due to chronic exposure to nonionizing radiation: Secondary | ICD-10-CM | POA: Diagnosis not present

## 2020-02-25 DIAGNOSIS — H532 Diplopia: Secondary | ICD-10-CM | POA: Diagnosis not present

## 2020-02-25 DIAGNOSIS — N529 Male erectile dysfunction, unspecified: Secondary | ICD-10-CM | POA: Diagnosis not present

## 2020-02-25 DIAGNOSIS — Z853 Personal history of malignant neoplasm of breast: Secondary | ICD-10-CM | POA: Diagnosis not present

## 2020-02-25 DIAGNOSIS — R1319 Other dysphagia: Secondary | ICD-10-CM | POA: Diagnosis not present

## 2020-02-25 DIAGNOSIS — R1310 Dysphagia, unspecified: Secondary | ICD-10-CM | POA: Diagnosis not present

## 2020-02-25 DIAGNOSIS — D801 Nonfamilial hypogammaglobulinemia: Secondary | ICD-10-CM | POA: Diagnosis not present

## 2020-02-25 DIAGNOSIS — K649 Unspecified hemorrhoids: Secondary | ICD-10-CM | POA: Diagnosis not present

## 2020-02-25 DIAGNOSIS — G4733 Obstructive sleep apnea (adult) (pediatric): Secondary | ICD-10-CM | POA: Diagnosis not present

## 2020-02-25 DIAGNOSIS — N400 Enlarged prostate without lower urinary tract symptoms: Secondary | ICD-10-CM | POA: Diagnosis not present

## 2020-02-25 DIAGNOSIS — R634 Abnormal weight loss: Secondary | ICD-10-CM | POA: Diagnosis not present

## 2020-02-25 DIAGNOSIS — H25812 Combined forms of age-related cataract, left eye: Secondary | ICD-10-CM | POA: Diagnosis not present

## 2020-02-25 DIAGNOSIS — N200 Calculus of kidney: Secondary | ICD-10-CM | POA: Diagnosis not present

## 2020-02-25 DIAGNOSIS — C770 Secondary and unspecified malignant neoplasm of lymph nodes of head, face and neck: Secondary | ICD-10-CM | POA: Diagnosis not present

## 2020-02-25 DIAGNOSIS — I110 Hypertensive heart disease with heart failure: Secondary | ICD-10-CM | POA: Diagnosis not present

## 2020-02-25 DIAGNOSIS — Z940 Kidney transplant status: Secondary | ICD-10-CM | POA: Diagnosis not present

## 2020-02-25 DIAGNOSIS — H168 Other keratitis: Secondary | ICD-10-CM | POA: Diagnosis not present

## 2020-02-25 DIAGNOSIS — D1801 Hemangioma of skin and subcutaneous tissue: Secondary | ICD-10-CM | POA: Diagnosis not present

## 2020-02-25 DIAGNOSIS — D539 Nutritional anemia, unspecified: Secondary | ICD-10-CM | POA: Diagnosis not present

## 2020-02-25 DIAGNOSIS — R410 Disorientation, unspecified: Secondary | ICD-10-CM | POA: Diagnosis not present

## 2020-02-25 DIAGNOSIS — S99921A Unspecified injury of right foot, initial encounter: Secondary | ICD-10-CM | POA: Diagnosis not present

## 2020-02-25 DIAGNOSIS — R591 Generalized enlarged lymph nodes: Secondary | ICD-10-CM | POA: Diagnosis not present

## 2020-02-25 DIAGNOSIS — B372 Candidiasis of skin and nail: Secondary | ICD-10-CM | POA: Diagnosis not present

## 2020-02-25 DIAGNOSIS — M4726 Other spondylosis with radiculopathy, lumbar region: Secondary | ICD-10-CM | POA: Diagnosis not present

## 2020-02-25 DIAGNOSIS — W540XXA Bitten by dog, initial encounter: Secondary | ICD-10-CM | POA: Diagnosis not present

## 2020-02-25 DIAGNOSIS — M25422 Effusion, left elbow: Secondary | ICD-10-CM | POA: Diagnosis not present

## 2020-02-25 DIAGNOSIS — R339 Retention of urine, unspecified: Secondary | ICD-10-CM | POA: Diagnosis not present

## 2020-02-25 DIAGNOSIS — Z48812 Encounter for surgical aftercare following surgery on the circulatory system: Secondary | ICD-10-CM | POA: Diagnosis not present

## 2020-02-25 DIAGNOSIS — Z452 Encounter for adjustment and management of vascular access device: Secondary | ICD-10-CM | POA: Diagnosis not present

## 2020-02-25 DIAGNOSIS — H353131 Nonexudative age-related macular degeneration, bilateral, early dry stage: Secondary | ICD-10-CM | POA: Diagnosis not present

## 2020-02-25 DIAGNOSIS — H9319 Tinnitus, unspecified ear: Secondary | ICD-10-CM | POA: Diagnosis not present

## 2020-02-25 DIAGNOSIS — C50911 Malignant neoplasm of unspecified site of right female breast: Secondary | ICD-10-CM | POA: Diagnosis not present

## 2020-02-25 DIAGNOSIS — R079 Chest pain, unspecified: Secondary | ICD-10-CM | POA: Diagnosis not present

## 2020-02-25 DIAGNOSIS — Z6841 Body Mass Index (BMI) 40.0 and over, adult: Secondary | ICD-10-CM | POA: Diagnosis not present

## 2020-02-25 DIAGNOSIS — N486 Induration penis plastica: Secondary | ICD-10-CM | POA: Diagnosis not present

## 2020-02-25 DIAGNOSIS — H3321 Serous retinal detachment, right eye: Secondary | ICD-10-CM | POA: Diagnosis not present

## 2020-02-25 DIAGNOSIS — M6281 Muscle weakness (generalized): Secondary | ICD-10-CM | POA: Diagnosis not present

## 2020-02-25 DIAGNOSIS — E109 Type 1 diabetes mellitus without complications: Secondary | ICD-10-CM | POA: Diagnosis not present

## 2020-02-25 DIAGNOSIS — Z9189 Other specified personal risk factors, not elsewhere classified: Secondary | ICD-10-CM | POA: Diagnosis not present

## 2020-02-25 DIAGNOSIS — R197 Diarrhea, unspecified: Secondary | ICD-10-CM | POA: Diagnosis not present

## 2020-02-25 DIAGNOSIS — Z8616 Personal history of COVID-19: Secondary | ICD-10-CM | POA: Diagnosis not present

## 2020-02-25 DIAGNOSIS — M5136 Other intervertebral disc degeneration, lumbar region: Secondary | ICD-10-CM | POA: Diagnosis not present

## 2020-02-25 DIAGNOSIS — R7303 Prediabetes: Secondary | ICD-10-CM | POA: Diagnosis not present

## 2020-02-25 DIAGNOSIS — I5023 Acute on chronic systolic (congestive) heart failure: Secondary | ICD-10-CM | POA: Diagnosis not present

## 2020-02-25 DIAGNOSIS — M7062 Trochanteric bursitis, left hip: Secondary | ICD-10-CM | POA: Diagnosis not present

## 2020-02-25 DIAGNOSIS — R600 Localized edema: Secondary | ICD-10-CM | POA: Diagnosis not present

## 2020-02-25 DIAGNOSIS — M65871 Other synovitis and tenosynovitis, right ankle and foot: Secondary | ICD-10-CM | POA: Diagnosis not present

## 2020-02-25 DIAGNOSIS — H35372 Puckering of macula, left eye: Secondary | ICD-10-CM | POA: Diagnosis not present

## 2020-02-25 DIAGNOSIS — H35413 Lattice degeneration of retina, bilateral: Secondary | ICD-10-CM | POA: Diagnosis not present

## 2020-02-25 DIAGNOSIS — L988 Other specified disorders of the skin and subcutaneous tissue: Secondary | ICD-10-CM | POA: Diagnosis not present

## 2020-02-25 DIAGNOSIS — R12 Heartburn: Secondary | ICD-10-CM | POA: Diagnosis not present

## 2020-02-25 DIAGNOSIS — M2560 Stiffness of unspecified joint, not elsewhere classified: Secondary | ICD-10-CM | POA: Diagnosis not present

## 2020-02-25 DIAGNOSIS — R945 Abnormal results of liver function studies: Secondary | ICD-10-CM | POA: Diagnosis not present

## 2020-02-25 DIAGNOSIS — I83893 Varicose veins of bilateral lower extremities with other complications: Secondary | ICD-10-CM | POA: Diagnosis not present

## 2020-02-25 DIAGNOSIS — R351 Nocturia: Secondary | ICD-10-CM | POA: Diagnosis not present

## 2020-02-25 DIAGNOSIS — T85111A Breakdown (mechanical) of implanted electronic neurostimulator (electrode) of peripheral nerve, initial encounter: Secondary | ICD-10-CM | POA: Diagnosis not present

## 2020-02-25 DIAGNOSIS — C778 Secondary and unspecified malignant neoplasm of lymph nodes of multiple regions: Secondary | ICD-10-CM | POA: Diagnosis not present

## 2020-02-25 DIAGNOSIS — G4734 Idiopathic sleep related nonobstructive alveolar hypoventilation: Secondary | ICD-10-CM | POA: Diagnosis not present

## 2020-02-25 DIAGNOSIS — L570 Actinic keratosis: Secondary | ICD-10-CM | POA: Diagnosis not present

## 2020-02-25 DIAGNOSIS — G43719 Chronic migraine without aura, intractable, without status migrainosus: Secondary | ICD-10-CM | POA: Diagnosis not present

## 2020-02-25 DIAGNOSIS — R2681 Unsteadiness on feet: Secondary | ICD-10-CM | POA: Diagnosis not present

## 2020-02-25 DIAGNOSIS — Z48298 Encounter for aftercare following other organ transplant: Secondary | ICD-10-CM | POA: Diagnosis not present

## 2020-02-25 DIAGNOSIS — R310 Gross hematuria: Secondary | ICD-10-CM | POA: Diagnosis not present

## 2020-02-25 DIAGNOSIS — Z9114 Patient's other noncompliance with medication regimen: Secondary | ICD-10-CM | POA: Diagnosis not present

## 2020-02-25 DIAGNOSIS — Z5329 Procedure and treatment not carried out because of patient's decision for other reasons: Secondary | ICD-10-CM | POA: Diagnosis not present

## 2020-02-25 DIAGNOSIS — F321 Major depressive disorder, single episode, moderate: Secondary | ICD-10-CM | POA: Diagnosis not present

## 2020-02-25 DIAGNOSIS — E669 Obesity, unspecified: Secondary | ICD-10-CM | POA: Diagnosis not present

## 2020-02-25 DIAGNOSIS — I272 Pulmonary hypertension, unspecified: Secondary | ICD-10-CM | POA: Diagnosis not present

## 2020-02-25 DIAGNOSIS — F5105 Insomnia due to other mental disorder: Secondary | ICD-10-CM | POA: Diagnosis not present

## 2020-02-25 DIAGNOSIS — N401 Enlarged prostate with lower urinary tract symptoms: Secondary | ICD-10-CM | POA: Diagnosis not present

## 2020-02-25 DIAGNOSIS — K2930 Chronic superficial gastritis without bleeding: Secondary | ICD-10-CM | POA: Diagnosis not present

## 2020-02-25 DIAGNOSIS — F0391 Unspecified dementia with behavioral disturbance: Secondary | ICD-10-CM | POA: Diagnosis not present

## 2020-02-25 DIAGNOSIS — G96198 Other disorders of meninges, not elsewhere classified: Secondary | ICD-10-CM | POA: Diagnosis not present

## 2020-02-25 DIAGNOSIS — G479 Sleep disorder, unspecified: Secondary | ICD-10-CM | POA: Diagnosis not present

## 2020-02-25 DIAGNOSIS — L97921 Non-pressure chronic ulcer of unspecified part of left lower leg limited to breakdown of skin: Secondary | ICD-10-CM | POA: Diagnosis not present

## 2020-02-25 DIAGNOSIS — Z139 Encounter for screening, unspecified: Secondary | ICD-10-CM | POA: Diagnosis not present

## 2020-02-25 DIAGNOSIS — Z96641 Presence of right artificial hip joint: Secondary | ICD-10-CM | POA: Diagnosis not present

## 2020-02-25 DIAGNOSIS — M79672 Pain in left foot: Secondary | ICD-10-CM | POA: Diagnosis not present

## 2020-02-25 DIAGNOSIS — M722 Plantar fascial fibromatosis: Secondary | ICD-10-CM | POA: Diagnosis not present

## 2020-02-25 DIAGNOSIS — E113313 Type 2 diabetes mellitus with moderate nonproliferative diabetic retinopathy with macular edema, bilateral: Secondary | ICD-10-CM | POA: Diagnosis not present

## 2020-02-25 DIAGNOSIS — M47817 Spondylosis without myelopathy or radiculopathy, lumbosacral region: Secondary | ICD-10-CM | POA: Diagnosis not present

## 2020-02-25 DIAGNOSIS — R519 Headache, unspecified: Secondary | ICD-10-CM | POA: Diagnosis not present

## 2020-02-25 DIAGNOSIS — R928 Other abnormal and inconclusive findings on diagnostic imaging of breast: Secondary | ICD-10-CM | POA: Diagnosis not present

## 2020-02-25 DIAGNOSIS — Z87891 Personal history of nicotine dependence: Secondary | ICD-10-CM | POA: Diagnosis not present

## 2020-02-25 DIAGNOSIS — C61 Malignant neoplasm of prostate: Secondary | ICD-10-CM | POA: Diagnosis not present

## 2020-02-25 DIAGNOSIS — R1031 Right lower quadrant pain: Secondary | ICD-10-CM | POA: Diagnosis not present

## 2020-02-25 DIAGNOSIS — K219 Gastro-esophageal reflux disease without esophagitis: Secondary | ICD-10-CM | POA: Diagnosis not present

## 2020-02-25 DIAGNOSIS — Z954 Presence of other heart-valve replacement: Secondary | ICD-10-CM | POA: Diagnosis not present

## 2020-02-25 DIAGNOSIS — F310 Bipolar disorder, current episode hypomanic: Secondary | ICD-10-CM | POA: Diagnosis not present

## 2020-02-25 DIAGNOSIS — D472 Monoclonal gammopathy: Secondary | ICD-10-CM | POA: Diagnosis not present

## 2020-02-25 DIAGNOSIS — J479 Bronchiectasis, uncomplicated: Secondary | ICD-10-CM | POA: Diagnosis not present

## 2020-02-25 DIAGNOSIS — Z03818 Encounter for observation for suspected exposure to other biological agents ruled out: Secondary | ICD-10-CM | POA: Diagnosis not present

## 2020-02-25 DIAGNOSIS — H40053 Ocular hypertension, bilateral: Secondary | ICD-10-CM | POA: Diagnosis not present

## 2020-02-25 DIAGNOSIS — I482 Chronic atrial fibrillation, unspecified: Secondary | ICD-10-CM | POA: Diagnosis not present

## 2020-02-25 DIAGNOSIS — L97519 Non-pressure chronic ulcer of other part of right foot with unspecified severity: Secondary | ICD-10-CM | POA: Diagnosis not present

## 2020-02-25 DIAGNOSIS — R350 Frequency of micturition: Secondary | ICD-10-CM | POA: Diagnosis not present

## 2020-02-25 DIAGNOSIS — I2721 Secondary pulmonary arterial hypertension: Secondary | ICD-10-CM | POA: Diagnosis not present

## 2020-02-25 DIAGNOSIS — E1159 Type 2 diabetes mellitus with other circulatory complications: Secondary | ICD-10-CM | POA: Diagnosis not present

## 2020-02-25 DIAGNOSIS — R29818 Other symptoms and signs involving the nervous system: Secondary | ICD-10-CM | POA: Diagnosis not present

## 2020-02-25 DIAGNOSIS — L608 Other nail disorders: Secondary | ICD-10-CM | POA: Diagnosis not present

## 2020-02-25 DIAGNOSIS — M7752 Other enthesopathy of left foot: Secondary | ICD-10-CM | POA: Diagnosis not present

## 2020-02-25 DIAGNOSIS — Z2821 Immunization not carried out because of patient refusal: Secondary | ICD-10-CM | POA: Diagnosis not present

## 2020-02-25 DIAGNOSIS — L97909 Non-pressure chronic ulcer of unspecified part of unspecified lower leg with unspecified severity: Secondary | ICD-10-CM | POA: Diagnosis not present

## 2020-02-25 DIAGNOSIS — G992 Myelopathy in diseases classified elsewhere: Secondary | ICD-10-CM | POA: Diagnosis not present

## 2020-02-25 DIAGNOSIS — M818 Other osteoporosis without current pathological fracture: Secondary | ICD-10-CM | POA: Diagnosis not present

## 2020-02-25 DIAGNOSIS — J309 Allergic rhinitis, unspecified: Secondary | ICD-10-CM | POA: Diagnosis not present

## 2020-02-25 DIAGNOSIS — M797 Fibromyalgia: Secondary | ICD-10-CM | POA: Diagnosis not present

## 2020-02-25 DIAGNOSIS — K760 Fatty (change of) liver, not elsewhere classified: Secondary | ICD-10-CM | POA: Diagnosis not present

## 2020-02-25 DIAGNOSIS — M549 Dorsalgia, unspecified: Secondary | ICD-10-CM | POA: Diagnosis not present

## 2020-02-25 DIAGNOSIS — F341 Dysthymic disorder: Secondary | ICD-10-CM | POA: Diagnosis not present

## 2020-02-25 DIAGNOSIS — R7301 Impaired fasting glucose: Secondary | ICD-10-CM | POA: Diagnosis not present

## 2020-02-25 DIAGNOSIS — M9905 Segmental and somatic dysfunction of pelvic region: Secondary | ICD-10-CM | POA: Diagnosis not present

## 2020-02-25 DIAGNOSIS — Z6825 Body mass index (BMI) 25.0-25.9, adult: Secondary | ICD-10-CM | POA: Diagnosis not present

## 2020-02-25 DIAGNOSIS — L659 Nonscarring hair loss, unspecified: Secondary | ICD-10-CM | POA: Diagnosis not present

## 2020-02-25 DIAGNOSIS — I252 Old myocardial infarction: Secondary | ICD-10-CM | POA: Diagnosis not present

## 2020-02-25 DIAGNOSIS — Z950 Presence of cardiac pacemaker: Secondary | ICD-10-CM | POA: Diagnosis not present

## 2020-02-25 DIAGNOSIS — D2262 Melanocytic nevi of left upper limb, including shoulder: Secondary | ICD-10-CM | POA: Diagnosis not present

## 2020-02-25 DIAGNOSIS — R739 Hyperglycemia, unspecified: Secondary | ICD-10-CM | POA: Diagnosis not present

## 2020-02-25 DIAGNOSIS — M7712 Lateral epicondylitis, left elbow: Secondary | ICD-10-CM | POA: Diagnosis not present

## 2020-02-25 DIAGNOSIS — M810 Age-related osteoporosis without current pathological fracture: Secondary | ICD-10-CM | POA: Diagnosis not present

## 2020-02-25 DIAGNOSIS — M25522 Pain in left elbow: Secondary | ICD-10-CM | POA: Diagnosis not present

## 2020-02-25 DIAGNOSIS — H9190 Unspecified hearing loss, unspecified ear: Secondary | ICD-10-CM | POA: Diagnosis not present

## 2020-02-25 DIAGNOSIS — N1832 Chronic kidney disease, stage 3b: Secondary | ICD-10-CM | POA: Diagnosis not present

## 2020-02-25 DIAGNOSIS — M170 Bilateral primary osteoarthritis of knee: Secondary | ICD-10-CM | POA: Diagnosis not present

## 2020-02-25 DIAGNOSIS — D519 Vitamin B12 deficiency anemia, unspecified: Secondary | ICD-10-CM | POA: Diagnosis not present

## 2020-02-25 DIAGNOSIS — G4719 Other hypersomnia: Secondary | ICD-10-CM | POA: Diagnosis not present

## 2020-02-25 DIAGNOSIS — R9430 Abnormal result of cardiovascular function study, unspecified: Secondary | ICD-10-CM | POA: Diagnosis not present

## 2020-02-25 DIAGNOSIS — L304 Erythema intertrigo: Secondary | ICD-10-CM | POA: Diagnosis not present

## 2020-02-25 DIAGNOSIS — G43119 Migraine with aura, intractable, without status migrainosus: Secondary | ICD-10-CM | POA: Diagnosis not present

## 2020-02-25 DIAGNOSIS — R9439 Abnormal result of other cardiovascular function study: Secondary | ICD-10-CM | POA: Diagnosis not present

## 2020-02-25 DIAGNOSIS — F4322 Adjustment disorder with anxiety: Secondary | ICD-10-CM | POA: Diagnosis not present

## 2020-02-25 DIAGNOSIS — M5417 Radiculopathy, lumbosacral region: Secondary | ICD-10-CM | POA: Diagnosis not present

## 2020-02-25 DIAGNOSIS — Z136 Encounter for screening for cardiovascular disorders: Secondary | ICD-10-CM | POA: Diagnosis not present

## 2020-02-25 DIAGNOSIS — J983 Compensatory emphysema: Secondary | ICD-10-CM | POA: Diagnosis not present

## 2020-02-25 DIAGNOSIS — I340 Nonrheumatic mitral (valve) insufficiency: Secondary | ICD-10-CM | POA: Diagnosis not present

## 2020-02-25 DIAGNOSIS — L820 Inflamed seborrheic keratosis: Secondary | ICD-10-CM | POA: Diagnosis not present

## 2020-02-25 DIAGNOSIS — Z5181 Encounter for therapeutic drug level monitoring: Secondary | ICD-10-CM | POA: Diagnosis not present

## 2020-02-25 DIAGNOSIS — D123 Benign neoplasm of transverse colon: Secondary | ICD-10-CM | POA: Diagnosis not present

## 2020-02-25 DIAGNOSIS — Z171 Estrogen receptor negative status [ER-]: Secondary | ICD-10-CM | POA: Diagnosis not present

## 2020-02-25 DIAGNOSIS — G8929 Other chronic pain: Secondary | ICD-10-CM | POA: Diagnosis not present

## 2020-02-25 DIAGNOSIS — W57XXXD Bitten or stung by nonvenomous insect and other nonvenomous arthropods, subsequent encounter: Secondary | ICD-10-CM | POA: Diagnosis not present

## 2020-02-25 DIAGNOSIS — I83009 Varicose veins of unspecified lower extremity with ulcer of unspecified site: Secondary | ICD-10-CM | POA: Diagnosis not present

## 2020-02-25 DIAGNOSIS — Z7952 Long term (current) use of systemic steroids: Secondary | ICD-10-CM | POA: Diagnosis not present

## 2020-02-25 DIAGNOSIS — Z936 Other artificial openings of urinary tract status: Secondary | ICD-10-CM | POA: Diagnosis not present

## 2020-02-25 DIAGNOSIS — Z7902 Long term (current) use of antithrombotics/antiplatelets: Secondary | ICD-10-CM | POA: Diagnosis not present

## 2020-02-25 DIAGNOSIS — D7219 Other eosinophilia: Secondary | ICD-10-CM | POA: Diagnosis not present

## 2020-02-25 DIAGNOSIS — F449 Dissociative and conversion disorder, unspecified: Secondary | ICD-10-CM | POA: Diagnosis not present

## 2020-02-25 DIAGNOSIS — I2782 Chronic pulmonary embolism: Secondary | ICD-10-CM | POA: Diagnosis not present

## 2020-02-25 DIAGNOSIS — I6782 Cerebral ischemia: Secondary | ICD-10-CM | POA: Diagnosis not present

## 2020-02-25 DIAGNOSIS — R569 Unspecified convulsions: Secondary | ICD-10-CM | POA: Diagnosis not present

## 2020-02-25 DIAGNOSIS — C182 Malignant neoplasm of ascending colon: Secondary | ICD-10-CM | POA: Diagnosis not present

## 2020-02-25 DIAGNOSIS — S81801A Unspecified open wound, right lower leg, initial encounter: Secondary | ICD-10-CM | POA: Diagnosis not present

## 2020-02-25 DIAGNOSIS — Q211 Atrial septal defect: Secondary | ICD-10-CM | POA: Diagnosis not present

## 2020-02-25 DIAGNOSIS — D530 Protein deficiency anemia: Secondary | ICD-10-CM | POA: Diagnosis not present

## 2020-02-25 DIAGNOSIS — Z9981 Dependence on supplemental oxygen: Secondary | ICD-10-CM | POA: Diagnosis not present

## 2020-02-25 DIAGNOSIS — C4442 Squamous cell carcinoma of skin of scalp and neck: Secondary | ICD-10-CM | POA: Diagnosis not present

## 2020-02-25 DIAGNOSIS — M47812 Spondylosis without myelopathy or radiculopathy, cervical region: Secondary | ICD-10-CM | POA: Diagnosis not present

## 2020-02-25 DIAGNOSIS — R0789 Other chest pain: Secondary | ICD-10-CM | POA: Diagnosis not present

## 2020-02-25 DIAGNOSIS — C090 Malignant neoplasm of tonsillar fossa: Secondary | ICD-10-CM | POA: Diagnosis not present

## 2020-02-25 DIAGNOSIS — M179 Osteoarthritis of knee, unspecified: Secondary | ICD-10-CM | POA: Diagnosis not present

## 2020-02-25 DIAGNOSIS — M25411 Effusion, right shoulder: Secondary | ICD-10-CM | POA: Diagnosis not present

## 2020-02-25 DIAGNOSIS — N819 Female genital prolapse, unspecified: Secondary | ICD-10-CM | POA: Diagnosis not present

## 2020-02-25 DIAGNOSIS — R918 Other nonspecific abnormal finding of lung field: Secondary | ICD-10-CM | POA: Diagnosis not present

## 2020-02-25 DIAGNOSIS — N138 Other obstructive and reflux uropathy: Secondary | ICD-10-CM | POA: Diagnosis not present

## 2020-02-25 DIAGNOSIS — Z6828 Body mass index (BMI) 28.0-28.9, adult: Secondary | ICD-10-CM | POA: Diagnosis not present

## 2020-02-25 DIAGNOSIS — Z122 Encounter for screening for malignant neoplasm of respiratory organs: Secondary | ICD-10-CM | POA: Diagnosis not present

## 2020-02-25 DIAGNOSIS — R279 Unspecified lack of coordination: Secondary | ICD-10-CM | POA: Diagnosis not present

## 2020-02-25 DIAGNOSIS — J4530 Mild persistent asthma, uncomplicated: Secondary | ICD-10-CM | POA: Diagnosis not present

## 2020-02-25 DIAGNOSIS — N952 Postmenopausal atrophic vaginitis: Secondary | ICD-10-CM | POA: Diagnosis not present

## 2020-02-25 DIAGNOSIS — M0609 Rheumatoid arthritis without rheumatoid factor, multiple sites: Secondary | ICD-10-CM | POA: Diagnosis not present

## 2020-02-25 DIAGNOSIS — M25641 Stiffness of right hand, not elsewhere classified: Secondary | ICD-10-CM | POA: Diagnosis not present

## 2020-02-25 DIAGNOSIS — N3941 Urge incontinence: Secondary | ICD-10-CM | POA: Diagnosis not present

## 2020-02-25 DIAGNOSIS — M7989 Other specified soft tissue disorders: Secondary | ICD-10-CM | POA: Diagnosis not present

## 2020-02-25 DIAGNOSIS — Z01810 Encounter for preprocedural cardiovascular examination: Secondary | ICD-10-CM | POA: Diagnosis not present

## 2020-02-25 DIAGNOSIS — Z86010 Personal history of colonic polyps: Secondary | ICD-10-CM | POA: Diagnosis not present

## 2020-02-25 DIAGNOSIS — S78111D Complete traumatic amputation at level between right hip and knee, subsequent encounter: Secondary | ICD-10-CM | POA: Diagnosis not present

## 2020-02-25 DIAGNOSIS — D3502 Benign neoplasm of left adrenal gland: Secondary | ICD-10-CM | POA: Diagnosis not present

## 2020-02-25 DIAGNOSIS — F0390 Unspecified dementia without behavioral disturbance: Secondary | ICD-10-CM | POA: Diagnosis not present

## 2020-02-25 DIAGNOSIS — I4819 Other persistent atrial fibrillation: Secondary | ICD-10-CM | POA: Diagnosis not present

## 2020-02-25 DIAGNOSIS — Z95810 Presence of automatic (implantable) cardiac defibrillator: Secondary | ICD-10-CM | POA: Diagnosis not present

## 2020-02-25 DIAGNOSIS — L89151 Pressure ulcer of sacral region, stage 1: Secondary | ICD-10-CM | POA: Diagnosis not present

## 2020-02-25 DIAGNOSIS — D509 Iron deficiency anemia, unspecified: Secondary | ICD-10-CM | POA: Diagnosis not present

## 2020-02-25 DIAGNOSIS — M7552 Bursitis of left shoulder: Secondary | ICD-10-CM | POA: Diagnosis not present

## 2020-02-25 DIAGNOSIS — H43393 Other vitreous opacities, bilateral: Secondary | ICD-10-CM | POA: Diagnosis not present

## 2020-02-25 DIAGNOSIS — E7849 Other hyperlipidemia: Secondary | ICD-10-CM | POA: Diagnosis not present

## 2020-02-25 DIAGNOSIS — J441 Chronic obstructive pulmonary disease with (acute) exacerbation: Secondary | ICD-10-CM | POA: Diagnosis not present

## 2020-02-25 DIAGNOSIS — M19079 Primary osteoarthritis, unspecified ankle and foot: Secondary | ICD-10-CM | POA: Diagnosis not present

## 2020-02-25 DIAGNOSIS — N3090 Cystitis, unspecified without hematuria: Secondary | ICD-10-CM | POA: Diagnosis not present

## 2020-02-25 DIAGNOSIS — G894 Chronic pain syndrome: Secondary | ICD-10-CM | POA: Diagnosis not present

## 2020-02-25 DIAGNOSIS — G609 Hereditary and idiopathic neuropathy, unspecified: Secondary | ICD-10-CM | POA: Diagnosis not present

## 2020-02-25 DIAGNOSIS — K5730 Diverticulosis of large intestine without perforation or abscess without bleeding: Secondary | ICD-10-CM | POA: Diagnosis not present

## 2020-02-25 DIAGNOSIS — Z0000 Encounter for general adult medical examination without abnormal findings: Secondary | ICD-10-CM | POA: Diagnosis not present

## 2020-02-25 DIAGNOSIS — M316 Other giant cell arteritis: Secondary | ICD-10-CM | POA: Diagnosis not present

## 2020-02-25 DIAGNOSIS — Z7982 Long term (current) use of aspirin: Secondary | ICD-10-CM | POA: Diagnosis not present

## 2020-02-25 DIAGNOSIS — R531 Weakness: Secondary | ICD-10-CM | POA: Diagnosis not present

## 2020-02-25 DIAGNOSIS — H903 Sensorineural hearing loss, bilateral: Secondary | ICD-10-CM | POA: Diagnosis not present

## 2020-02-25 DIAGNOSIS — A4902 Methicillin resistant Staphylococcus aureus infection, unspecified site: Secondary | ICD-10-CM | POA: Diagnosis not present

## 2020-02-25 DIAGNOSIS — J948 Other specified pleural conditions: Secondary | ICD-10-CM | POA: Diagnosis not present

## 2020-02-25 DIAGNOSIS — M25561 Pain in right knee: Secondary | ICD-10-CM | POA: Diagnosis not present

## 2020-02-25 DIAGNOSIS — Z951 Presence of aortocoronary bypass graft: Secondary | ICD-10-CM | POA: Diagnosis not present

## 2020-02-25 DIAGNOSIS — E11649 Type 2 diabetes mellitus with hypoglycemia without coma: Secondary | ICD-10-CM | POA: Diagnosis not present

## 2020-02-25 DIAGNOSIS — T85191A Other mechanical complication of implanted electronic neurostimulator (electrode) of peripheral nerve, initial encounter: Secondary | ICD-10-CM | POA: Diagnosis not present

## 2020-02-25 DIAGNOSIS — H5789 Other specified disorders of eye and adnexa: Secondary | ICD-10-CM | POA: Diagnosis not present

## 2020-02-25 DIAGNOSIS — H2511 Age-related nuclear cataract, right eye: Secondary | ICD-10-CM | POA: Diagnosis not present

## 2020-02-25 DIAGNOSIS — R930 Abnormal findings on diagnostic imaging of skull and head, not elsewhere classified: Secondary | ICD-10-CM | POA: Diagnosis not present

## 2020-02-25 DIAGNOSIS — S82831D Other fracture of upper and lower end of right fibula, subsequent encounter for closed fracture with routine healing: Secondary | ICD-10-CM | POA: Diagnosis not present

## 2020-02-25 DIAGNOSIS — E531 Pyridoxine deficiency: Secondary | ICD-10-CM | POA: Diagnosis not present

## 2020-02-25 DIAGNOSIS — M5430 Sciatica, unspecified side: Secondary | ICD-10-CM | POA: Diagnosis not present

## 2020-02-25 DIAGNOSIS — M21612 Bunion of left foot: Secondary | ICD-10-CM | POA: Diagnosis not present

## 2020-02-25 DIAGNOSIS — F332 Major depressive disorder, recurrent severe without psychotic features: Secondary | ICD-10-CM | POA: Diagnosis not present

## 2020-02-25 DIAGNOSIS — D2261 Melanocytic nevi of right upper limb, including shoulder: Secondary | ICD-10-CM | POA: Diagnosis not present

## 2020-02-25 DIAGNOSIS — E781 Pure hyperglyceridemia: Secondary | ICD-10-CM | POA: Diagnosis not present

## 2020-02-25 DIAGNOSIS — K317 Polyp of stomach and duodenum: Secondary | ICD-10-CM | POA: Diagnosis not present

## 2020-02-25 DIAGNOSIS — G959 Disease of spinal cord, unspecified: Secondary | ICD-10-CM | POA: Diagnosis not present

## 2020-02-25 DIAGNOSIS — M5489 Other dorsalgia: Secondary | ICD-10-CM | POA: Diagnosis not present

## 2020-02-25 DIAGNOSIS — D72819 Decreased white blood cell count, unspecified: Secondary | ICD-10-CM | POA: Diagnosis not present

## 2020-02-25 DIAGNOSIS — Z713 Dietary counseling and surveillance: Secondary | ICD-10-CM | POA: Diagnosis not present

## 2020-02-25 DIAGNOSIS — J9801 Acute bronchospasm: Secondary | ICD-10-CM | POA: Diagnosis not present

## 2020-02-25 DIAGNOSIS — I6523 Occlusion and stenosis of bilateral carotid arteries: Secondary | ICD-10-CM | POA: Diagnosis not present

## 2020-02-25 DIAGNOSIS — H547 Unspecified visual loss: Secondary | ICD-10-CM | POA: Diagnosis not present

## 2020-02-25 DIAGNOSIS — T402X5A Adverse effect of other opioids, initial encounter: Secondary | ICD-10-CM | POA: Diagnosis not present

## 2020-02-25 DIAGNOSIS — M13841 Other specified arthritis, right hand: Secondary | ICD-10-CM | POA: Diagnosis not present

## 2020-02-25 DIAGNOSIS — I429 Cardiomyopathy, unspecified: Secondary | ICD-10-CM | POA: Diagnosis not present

## 2020-02-25 DIAGNOSIS — H40023 Open angle with borderline findings, high risk, bilateral: Secondary | ICD-10-CM | POA: Diagnosis not present

## 2020-02-25 DIAGNOSIS — E162 Hypoglycemia, unspecified: Secondary | ICD-10-CM | POA: Diagnosis not present

## 2020-02-25 DIAGNOSIS — F410 Panic disorder [episodic paroxysmal anxiety] without agoraphobia: Secondary | ICD-10-CM | POA: Diagnosis not present

## 2020-02-25 DIAGNOSIS — S61412A Laceration without foreign body of left hand, initial encounter: Secondary | ICD-10-CM | POA: Diagnosis not present

## 2020-02-25 DIAGNOSIS — F3342 Major depressive disorder, recurrent, in full remission: Secondary | ICD-10-CM | POA: Diagnosis not present

## 2020-02-25 DIAGNOSIS — J449 Chronic obstructive pulmonary disease, unspecified: Secondary | ICD-10-CM | POA: Diagnosis not present

## 2020-02-25 DIAGNOSIS — Z8719 Personal history of other diseases of the digestive system: Secondary | ICD-10-CM | POA: Diagnosis not present

## 2020-02-25 DIAGNOSIS — Z0001 Encounter for general adult medical examination with abnormal findings: Secondary | ICD-10-CM | POA: Diagnosis not present

## 2020-02-25 DIAGNOSIS — G43711 Chronic migraine without aura, intractable, with status migrainosus: Secondary | ICD-10-CM | POA: Diagnosis not present

## 2020-02-25 DIAGNOSIS — I712 Thoracic aortic aneurysm, without rupture: Secondary | ICD-10-CM | POA: Diagnosis not present

## 2020-02-25 DIAGNOSIS — R200 Anesthesia of skin: Secondary | ICD-10-CM | POA: Diagnosis not present

## 2020-02-25 DIAGNOSIS — Z79899 Other long term (current) drug therapy: Secondary | ICD-10-CM | POA: Diagnosis not present

## 2020-02-25 DIAGNOSIS — D225 Melanocytic nevi of trunk: Secondary | ICD-10-CM | POA: Diagnosis not present

## 2020-02-25 DIAGNOSIS — Z96611 Presence of right artificial shoulder joint: Secondary | ICD-10-CM | POA: Diagnosis not present

## 2020-02-25 DIAGNOSIS — I509 Heart failure, unspecified: Secondary | ICD-10-CM | POA: Diagnosis not present

## 2020-02-25 DIAGNOSIS — J9691 Respiratory failure, unspecified with hypoxia: Secondary | ICD-10-CM | POA: Diagnosis not present

## 2020-02-25 DIAGNOSIS — M961 Postlaminectomy syndrome, not elsewhere classified: Secondary | ICD-10-CM | POA: Diagnosis not present

## 2020-02-25 DIAGNOSIS — Z8249 Family history of ischemic heart disease and other diseases of the circulatory system: Secondary | ICD-10-CM | POA: Diagnosis not present

## 2020-02-25 DIAGNOSIS — I70242 Atherosclerosis of native arteries of left leg with ulceration of calf: Secondary | ICD-10-CM | POA: Diagnosis not present

## 2020-02-25 DIAGNOSIS — R29898 Other symptoms and signs involving the musculoskeletal system: Secondary | ICD-10-CM | POA: Diagnosis not present

## 2020-02-25 DIAGNOSIS — E118 Type 2 diabetes mellitus with unspecified complications: Secondary | ICD-10-CM | POA: Diagnosis not present

## 2020-02-25 DIAGNOSIS — D72829 Elevated white blood cell count, unspecified: Secondary | ICD-10-CM | POA: Diagnosis not present

## 2020-02-25 DIAGNOSIS — I129 Hypertensive chronic kidney disease with stage 1 through stage 4 chronic kidney disease, or unspecified chronic kidney disease: Secondary | ICD-10-CM | POA: Diagnosis not present

## 2020-02-25 DIAGNOSIS — H04123 Dry eye syndrome of bilateral lacrimal glands: Secondary | ICD-10-CM | POA: Diagnosis not present

## 2020-02-25 DIAGNOSIS — I428 Other cardiomyopathies: Secondary | ICD-10-CM | POA: Diagnosis not present

## 2020-02-25 DIAGNOSIS — E1169 Type 2 diabetes mellitus with other specified complication: Secondary | ICD-10-CM | POA: Diagnosis not present

## 2020-02-25 DIAGNOSIS — M85852 Other specified disorders of bone density and structure, left thigh: Secondary | ICD-10-CM | POA: Diagnosis not present

## 2020-02-25 DIAGNOSIS — Z85828 Personal history of other malignant neoplasm of skin: Secondary | ICD-10-CM | POA: Diagnosis not present

## 2020-02-25 DIAGNOSIS — S0501XA Injury of conjunctiva and corneal abrasion without foreign body, right eye, initial encounter: Secondary | ICD-10-CM | POA: Diagnosis not present

## 2020-02-25 DIAGNOSIS — M1611 Unilateral primary osteoarthritis, right hip: Secondary | ICD-10-CM | POA: Diagnosis not present

## 2020-02-25 DIAGNOSIS — R258 Other abnormal involuntary movements: Secondary | ICD-10-CM | POA: Diagnosis not present

## 2020-02-25 DIAGNOSIS — M5106 Intervertebral disc disorders with myelopathy, lumbar region: Secondary | ICD-10-CM | POA: Diagnosis not present

## 2020-02-25 DIAGNOSIS — I493 Ventricular premature depolarization: Secondary | ICD-10-CM | POA: Diagnosis not present

## 2020-02-25 DIAGNOSIS — Z881 Allergy status to other antibiotic agents status: Secondary | ICD-10-CM | POA: Diagnosis not present

## 2020-02-25 DIAGNOSIS — D689 Coagulation defect, unspecified: Secondary | ICD-10-CM | POA: Diagnosis not present

## 2020-02-25 DIAGNOSIS — L03031 Cellulitis of right toe: Secondary | ICD-10-CM | POA: Diagnosis not present

## 2020-02-25 DIAGNOSIS — I8311 Varicose veins of right lower extremity with inflammation: Secondary | ICD-10-CM | POA: Diagnosis not present

## 2020-02-25 DIAGNOSIS — M8589 Other specified disorders of bone density and structure, multiple sites: Secondary | ICD-10-CM | POA: Diagnosis not present

## 2020-02-25 DIAGNOSIS — M25572 Pain in left ankle and joints of left foot: Secondary | ICD-10-CM | POA: Diagnosis not present

## 2020-02-25 DIAGNOSIS — M25461 Effusion, right knee: Secondary | ICD-10-CM | POA: Diagnosis not present

## 2020-02-25 DIAGNOSIS — H26491 Other secondary cataract, right eye: Secondary | ICD-10-CM | POA: Diagnosis not present

## 2020-02-25 DIAGNOSIS — Z833 Family history of diabetes mellitus: Secondary | ICD-10-CM | POA: Diagnosis not present

## 2020-02-25 DIAGNOSIS — H35363 Drusen (degenerative) of macula, bilateral: Secondary | ICD-10-CM | POA: Diagnosis not present

## 2020-02-25 DIAGNOSIS — R5381 Other malaise: Secondary | ICD-10-CM | POA: Diagnosis not present

## 2020-02-25 DIAGNOSIS — R319 Hematuria, unspecified: Secondary | ICD-10-CM | POA: Diagnosis not present

## 2020-02-25 DIAGNOSIS — H52223 Regular astigmatism, bilateral: Secondary | ICD-10-CM | POA: Diagnosis not present

## 2020-02-25 DIAGNOSIS — H524 Presbyopia: Secondary | ICD-10-CM | POA: Diagnosis not present

## 2020-02-25 DIAGNOSIS — J342 Deviated nasal septum: Secondary | ICD-10-CM | POA: Diagnosis not present

## 2020-02-25 DIAGNOSIS — Z6835 Body mass index (BMI) 35.0-35.9, adult: Secondary | ICD-10-CM | POA: Diagnosis not present

## 2020-02-25 DIAGNOSIS — H25813 Combined forms of age-related cataract, bilateral: Secondary | ICD-10-CM | POA: Diagnosis not present

## 2020-02-25 DIAGNOSIS — R1084 Generalized abdominal pain: Secondary | ICD-10-CM | POA: Diagnosis not present

## 2020-02-25 DIAGNOSIS — N390 Urinary tract infection, site not specified: Secondary | ICD-10-CM | POA: Diagnosis not present

## 2020-02-25 DIAGNOSIS — R3915 Urgency of urination: Secondary | ICD-10-CM | POA: Diagnosis not present

## 2020-02-25 DIAGNOSIS — F0280 Dementia in other diseases classified elsewhere without behavioral disturbance: Secondary | ICD-10-CM | POA: Diagnosis not present

## 2020-02-25 DIAGNOSIS — Z1151 Encounter for screening for human papillomavirus (HPV): Secondary | ICD-10-CM | POA: Diagnosis not present

## 2020-02-25 DIAGNOSIS — M85859 Other specified disorders of bone density and structure, unspecified thigh: Secondary | ICD-10-CM | POA: Diagnosis not present

## 2020-02-25 DIAGNOSIS — R001 Bradycardia, unspecified: Secondary | ICD-10-CM | POA: Diagnosis not present

## 2020-02-25 DIAGNOSIS — C642 Malignant neoplasm of left kidney, except renal pelvis: Secondary | ICD-10-CM | POA: Diagnosis not present

## 2020-02-25 DIAGNOSIS — E1165 Type 2 diabetes mellitus with hyperglycemia: Secondary | ICD-10-CM | POA: Diagnosis not present

## 2020-02-25 DIAGNOSIS — T451X5D Adverse effect of antineoplastic and immunosuppressive drugs, subsequent encounter: Secondary | ICD-10-CM | POA: Diagnosis not present

## 2020-02-25 DIAGNOSIS — F1520 Other stimulant dependence, uncomplicated: Secondary | ICD-10-CM | POA: Diagnosis not present

## 2020-02-25 DIAGNOSIS — K5790 Diverticulosis of intestine, part unspecified, without perforation or abscess without bleeding: Secondary | ICD-10-CM | POA: Diagnosis not present

## 2020-02-25 DIAGNOSIS — S72001D Fracture of unspecified part of neck of right femur, subsequent encounter for closed fracture with routine healing: Secondary | ICD-10-CM | POA: Diagnosis not present

## 2020-02-25 DIAGNOSIS — D6869 Other thrombophilia: Secondary | ICD-10-CM | POA: Diagnosis not present

## 2020-02-25 DIAGNOSIS — K5180 Other ulcerative colitis without complications: Secondary | ICD-10-CM | POA: Diagnosis not present

## 2020-02-25 DIAGNOSIS — M79674 Pain in right toe(s): Secondary | ICD-10-CM | POA: Diagnosis not present

## 2020-02-25 DIAGNOSIS — E291 Testicular hypofunction: Secondary | ICD-10-CM | POA: Diagnosis not present

## 2020-02-25 DIAGNOSIS — R944 Abnormal results of kidney function studies: Secondary | ICD-10-CM | POA: Diagnosis not present

## 2020-02-25 DIAGNOSIS — H401113 Primary open-angle glaucoma, right eye, severe stage: Secondary | ICD-10-CM | POA: Diagnosis not present

## 2020-02-25 DIAGNOSIS — Z960 Presence of urogenital implants: Secondary | ICD-10-CM | POA: Diagnosis not present

## 2020-02-25 DIAGNOSIS — S86892A Other injury of other muscle(s) and tendon(s) at lower leg level, left leg, initial encounter: Secondary | ICD-10-CM | POA: Diagnosis not present

## 2020-02-25 DIAGNOSIS — Z5189 Encounter for other specified aftercare: Secondary | ICD-10-CM | POA: Diagnosis not present

## 2020-02-25 DIAGNOSIS — Z4789 Encounter for other orthopedic aftercare: Secondary | ICD-10-CM | POA: Diagnosis not present

## 2020-02-25 DIAGNOSIS — Z89611 Acquired absence of right leg above knee: Secondary | ICD-10-CM | POA: Diagnosis not present

## 2020-02-25 DIAGNOSIS — R0781 Pleurodynia: Secondary | ICD-10-CM | POA: Diagnosis not present

## 2020-02-25 DIAGNOSIS — M2580 Other specified joint disorders, unspecified joint: Secondary | ICD-10-CM | POA: Diagnosis not present

## 2020-02-25 DIAGNOSIS — F324 Major depressive disorder, single episode, in partial remission: Secondary | ICD-10-CM | POA: Diagnosis not present

## 2020-02-25 DIAGNOSIS — J90 Pleural effusion, not elsewhere classified: Secondary | ICD-10-CM | POA: Diagnosis not present

## 2020-02-25 DIAGNOSIS — H02831 Dermatochalasis of right upper eyelid: Secondary | ICD-10-CM | POA: Diagnosis not present

## 2020-02-25 DIAGNOSIS — J939 Pneumothorax, unspecified: Secondary | ICD-10-CM | POA: Diagnosis not present

## 2020-02-25 DIAGNOSIS — L400 Psoriasis vulgaris: Secondary | ICD-10-CM | POA: Diagnosis not present

## 2020-02-25 DIAGNOSIS — I350 Nonrheumatic aortic (valve) stenosis: Secondary | ICD-10-CM | POA: Diagnosis not present

## 2020-02-25 DIAGNOSIS — H34239 Retinal artery branch occlusion, unspecified eye: Secondary | ICD-10-CM | POA: Diagnosis not present

## 2020-02-25 DIAGNOSIS — M62551 Muscle wasting and atrophy, not elsewhere classified, right thigh: Secondary | ICD-10-CM | POA: Diagnosis not present

## 2020-02-25 DIAGNOSIS — I651 Occlusion and stenosis of basilar artery: Secondary | ICD-10-CM | POA: Diagnosis not present

## 2020-02-25 DIAGNOSIS — Z5111 Encounter for antineoplastic chemotherapy: Secondary | ICD-10-CM | POA: Diagnosis not present

## 2020-02-25 DIAGNOSIS — J9692 Respiratory failure, unspecified with hypercapnia: Secondary | ICD-10-CM | POA: Diagnosis not present

## 2020-02-25 DIAGNOSIS — I119 Hypertensive heart disease without heart failure: Secondary | ICD-10-CM | POA: Diagnosis not present

## 2020-02-25 DIAGNOSIS — R972 Elevated prostate specific antigen [PSA]: Secondary | ICD-10-CM | POA: Diagnosis not present

## 2020-02-25 DIAGNOSIS — E876 Hypokalemia: Secondary | ICD-10-CM | POA: Diagnosis not present

## 2020-02-25 DIAGNOSIS — N111 Chronic obstructive pyelonephritis: Secondary | ICD-10-CM | POA: Diagnosis not present

## 2020-02-25 DIAGNOSIS — Z471 Aftercare following joint replacement surgery: Secondary | ICD-10-CM | POA: Diagnosis not present

## 2020-02-25 DIAGNOSIS — E875 Hyperkalemia: Secondary | ICD-10-CM | POA: Diagnosis not present

## 2020-02-25 DIAGNOSIS — H353221 Exudative age-related macular degeneration, left eye, with active choroidal neovascularization: Secondary | ICD-10-CM | POA: Diagnosis not present

## 2020-02-25 DIAGNOSIS — F4310 Post-traumatic stress disorder, unspecified: Secondary | ICD-10-CM | POA: Diagnosis not present

## 2020-02-25 DIAGNOSIS — M9901 Segmental and somatic dysfunction of cervical region: Secondary | ICD-10-CM | POA: Diagnosis not present

## 2020-02-25 DIAGNOSIS — M4326 Fusion of spine, lumbar region: Secondary | ICD-10-CM | POA: Diagnosis not present

## 2020-02-25 DIAGNOSIS — I679 Cerebrovascular disease, unspecified: Secondary | ICD-10-CM | POA: Diagnosis not present

## 2020-02-25 DIAGNOSIS — Z7984 Long term (current) use of oral hypoglycemic drugs: Secondary | ICD-10-CM | POA: Diagnosis not present

## 2020-02-25 DIAGNOSIS — G9340 Encephalopathy, unspecified: Secondary | ICD-10-CM | POA: Diagnosis not present

## 2020-02-25 DIAGNOSIS — F99 Mental disorder, not otherwise specified: Secondary | ICD-10-CM | POA: Diagnosis not present

## 2020-02-25 DIAGNOSIS — I4892 Unspecified atrial flutter: Secondary | ICD-10-CM | POA: Diagnosis not present

## 2020-02-25 DIAGNOSIS — L02519 Cutaneous abscess of unspecified hand: Secondary | ICD-10-CM | POA: Diagnosis not present

## 2020-02-25 DIAGNOSIS — S22000A Wedge compression fracture of unspecified thoracic vertebra, initial encounter for closed fracture: Secondary | ICD-10-CM | POA: Diagnosis not present

## 2020-02-25 DIAGNOSIS — Z8541 Personal history of malignant neoplasm of cervix uteri: Secondary | ICD-10-CM | POA: Diagnosis not present

## 2020-02-25 DIAGNOSIS — M19022 Primary osteoarthritis, left elbow: Secondary | ICD-10-CM | POA: Diagnosis not present

## 2020-02-25 DIAGNOSIS — G5643 Causalgia of bilateral upper limbs: Secondary | ICD-10-CM | POA: Diagnosis not present

## 2020-02-25 DIAGNOSIS — M2011 Hallux valgus (acquired), right foot: Secondary | ICD-10-CM | POA: Diagnosis not present

## 2020-02-25 DIAGNOSIS — I69328 Other speech and language deficits following cerebral infarction: Secondary | ICD-10-CM | POA: Diagnosis not present

## 2020-02-25 DIAGNOSIS — L03115 Cellulitis of right lower limb: Secondary | ICD-10-CM | POA: Diagnosis not present

## 2020-02-25 DIAGNOSIS — N189 Chronic kidney disease, unspecified: Secondary | ICD-10-CM | POA: Diagnosis not present

## 2020-02-25 DIAGNOSIS — M50321 Other cervical disc degeneration at C4-C5 level: Secondary | ICD-10-CM | POA: Diagnosis not present

## 2020-02-25 DIAGNOSIS — N3010 Interstitial cystitis (chronic) without hematuria: Secondary | ICD-10-CM | POA: Diagnosis not present

## 2020-02-25 DIAGNOSIS — F3341 Major depressive disorder, recurrent, in partial remission: Secondary | ICD-10-CM | POA: Diagnosis not present

## 2020-02-25 DIAGNOSIS — M546 Pain in thoracic spine: Secondary | ICD-10-CM | POA: Diagnosis not present

## 2020-02-25 DIAGNOSIS — R030 Elevated blood-pressure reading, without diagnosis of hypertension: Secondary | ICD-10-CM | POA: Diagnosis not present

## 2020-02-25 DIAGNOSIS — Z8551 Personal history of malignant neoplasm of bladder: Secondary | ICD-10-CM | POA: Diagnosis not present

## 2020-02-25 DIAGNOSIS — E11610 Type 2 diabetes mellitus with diabetic neuropathic arthropathy: Secondary | ICD-10-CM | POA: Diagnosis not present

## 2020-02-25 DIAGNOSIS — M792 Neuralgia and neuritis, unspecified: Secondary | ICD-10-CM | POA: Diagnosis not present

## 2020-02-25 DIAGNOSIS — R55 Syncope and collapse: Secondary | ICD-10-CM | POA: Diagnosis not present

## 2020-02-25 DIAGNOSIS — K5909 Other constipation: Secondary | ICD-10-CM | POA: Diagnosis not present

## 2020-02-25 DIAGNOSIS — N951 Menopausal and female climacteric states: Secondary | ICD-10-CM | POA: Diagnosis not present

## 2020-02-25 DIAGNOSIS — R9431 Abnormal electrocardiogram [ECG] [EKG]: Secondary | ICD-10-CM | POA: Diagnosis not present

## 2020-02-25 DIAGNOSIS — E063 Autoimmune thyroiditis: Secondary | ICD-10-CM | POA: Diagnosis not present

## 2020-02-25 DIAGNOSIS — N183 Chronic kidney disease, stage 3 unspecified: Secondary | ICD-10-CM | POA: Diagnosis not present

## 2020-02-25 DIAGNOSIS — E860 Dehydration: Secondary | ICD-10-CM | POA: Diagnosis not present

## 2020-02-25 DIAGNOSIS — C671 Malignant neoplasm of dome of bladder: Secondary | ICD-10-CM | POA: Diagnosis not present

## 2020-02-25 DIAGNOSIS — M8949 Other hypertrophic osteoarthropathy, multiple sites: Secondary | ICD-10-CM | POA: Diagnosis not present

## 2020-02-25 DIAGNOSIS — H10413 Chronic giant papillary conjunctivitis, bilateral: Secondary | ICD-10-CM | POA: Diagnosis not present

## 2020-02-25 DIAGNOSIS — Z952 Presence of prosthetic heart valve: Secondary | ICD-10-CM | POA: Diagnosis not present

## 2020-02-25 DIAGNOSIS — M79652 Pain in left thigh: Secondary | ICD-10-CM | POA: Diagnosis not present

## 2020-02-25 DIAGNOSIS — R040 Epistaxis: Secondary | ICD-10-CM | POA: Diagnosis not present

## 2020-02-25 DIAGNOSIS — I700 Atherosclerosis of aorta: Secondary | ICD-10-CM | POA: Diagnosis not present

## 2020-02-25 DIAGNOSIS — H9042 Sensorineural hearing loss, unilateral, left ear, with unrestricted hearing on the contralateral side: Secondary | ICD-10-CM | POA: Diagnosis not present

## 2020-02-25 DIAGNOSIS — R05 Cough: Secondary | ICD-10-CM | POA: Diagnosis not present

## 2020-02-25 DIAGNOSIS — Z96642 Presence of left artificial hip joint: Secondary | ICD-10-CM | POA: Diagnosis not present

## 2020-02-25 DIAGNOSIS — T464X5A Adverse effect of angiotensin-converting-enzyme inhibitors, initial encounter: Secondary | ICD-10-CM | POA: Diagnosis not present

## 2020-02-25 DIAGNOSIS — J4521 Mild intermittent asthma with (acute) exacerbation: Secondary | ICD-10-CM | POA: Diagnosis not present

## 2020-02-25 DIAGNOSIS — K869 Disease of pancreas, unspecified: Secondary | ICD-10-CM | POA: Diagnosis not present

## 2020-02-25 DIAGNOSIS — K5900 Constipation, unspecified: Secondary | ICD-10-CM | POA: Diagnosis not present

## 2020-02-25 DIAGNOSIS — M62521 Muscle wasting and atrophy, not elsewhere classified, right upper arm: Secondary | ICD-10-CM | POA: Diagnosis not present

## 2020-02-25 DIAGNOSIS — M4807 Spinal stenosis, lumbosacral region: Secondary | ICD-10-CM | POA: Diagnosis not present

## 2020-02-25 DIAGNOSIS — H93293 Other abnormal auditory perceptions, bilateral: Secondary | ICD-10-CM | POA: Diagnosis not present

## 2020-02-25 DIAGNOSIS — N1330 Unspecified hydronephrosis: Secondary | ICD-10-CM | POA: Diagnosis not present

## 2020-02-25 DIAGNOSIS — F418 Other specified anxiety disorders: Secondary | ICD-10-CM | POA: Diagnosis not present

## 2020-02-25 DIAGNOSIS — I25810 Atherosclerosis of coronary artery bypass graft(s) without angina pectoris: Secondary | ICD-10-CM | POA: Diagnosis not present

## 2020-02-25 DIAGNOSIS — D45 Polycythemia vera: Secondary | ICD-10-CM | POA: Diagnosis not present

## 2020-02-25 DIAGNOSIS — Z7901 Long term (current) use of anticoagulants: Secondary | ICD-10-CM | POA: Diagnosis not present

## 2020-02-25 DIAGNOSIS — L270 Generalized skin eruption due to drugs and medicaments taken internally: Secondary | ICD-10-CM | POA: Diagnosis not present

## 2020-02-25 DIAGNOSIS — M19042 Primary osteoarthritis, left hand: Secondary | ICD-10-CM | POA: Diagnosis not present

## 2020-02-25 DIAGNOSIS — Z90710 Acquired absence of both cervix and uterus: Secondary | ICD-10-CM | POA: Diagnosis not present

## 2020-02-25 DIAGNOSIS — K5190 Ulcerative colitis, unspecified, without complications: Secondary | ICD-10-CM | POA: Diagnosis not present

## 2020-02-25 DIAGNOSIS — M21962 Unspecified acquired deformity of left lower leg: Secondary | ICD-10-CM | POA: Diagnosis not present

## 2020-02-25 DIAGNOSIS — G629 Polyneuropathy, unspecified: Secondary | ICD-10-CM | POA: Diagnosis not present

## 2020-02-25 DIAGNOSIS — G4489 Other headache syndrome: Secondary | ICD-10-CM | POA: Diagnosis not present

## 2020-02-25 DIAGNOSIS — Z01818 Encounter for other preprocedural examination: Secondary | ICD-10-CM | POA: Diagnosis not present

## 2020-02-25 DIAGNOSIS — I872 Venous insufficiency (chronic) (peripheral): Secondary | ICD-10-CM | POA: Diagnosis not present

## 2020-02-25 DIAGNOSIS — I4891 Unspecified atrial fibrillation: Secondary | ICD-10-CM | POA: Diagnosis not present

## 2020-02-25 DIAGNOSIS — F4010 Social phobia, unspecified: Secondary | ICD-10-CM | POA: Diagnosis not present

## 2020-02-25 DIAGNOSIS — K5903 Drug induced constipation: Secondary | ICD-10-CM | POA: Diagnosis not present

## 2020-02-25 DIAGNOSIS — I5032 Chronic diastolic (congestive) heart failure: Secondary | ICD-10-CM | POA: Diagnosis not present

## 2020-02-25 DIAGNOSIS — G5621 Lesion of ulnar nerve, right upper limb: Secondary | ICD-10-CM | POA: Diagnosis not present

## 2020-02-25 DIAGNOSIS — M25622 Stiffness of left elbow, not elsewhere classified: Secondary | ICD-10-CM | POA: Diagnosis not present

## 2020-02-25 DIAGNOSIS — E785 Hyperlipidemia, unspecified: Secondary | ICD-10-CM | POA: Diagnosis not present

## 2020-02-25 DIAGNOSIS — D481 Neoplasm of uncertain behavior of connective and other soft tissue: Secondary | ICD-10-CM | POA: Diagnosis not present

## 2020-02-25 DIAGNOSIS — Z86718 Personal history of other venous thrombosis and embolism: Secondary | ICD-10-CM | POA: Diagnosis not present

## 2020-02-25 DIAGNOSIS — K625 Hemorrhage of anus and rectum: Secondary | ICD-10-CM | POA: Diagnosis not present

## 2020-02-25 DIAGNOSIS — N650 Deformity of reconstructed breast: Secondary | ICD-10-CM | POA: Diagnosis not present

## 2020-02-25 DIAGNOSIS — B351 Tinea unguium: Secondary | ICD-10-CM | POA: Diagnosis not present

## 2020-02-25 DIAGNOSIS — C3412 Malignant neoplasm of upper lobe, left bronchus or lung: Secondary | ICD-10-CM | POA: Diagnosis not present

## 2020-02-25 DIAGNOSIS — E1029 Type 1 diabetes mellitus with other diabetic kidney complication: Secondary | ICD-10-CM | POA: Diagnosis not present

## 2020-02-25 DIAGNOSIS — C4441 Basal cell carcinoma of skin of scalp and neck: Secondary | ICD-10-CM | POA: Diagnosis not present

## 2020-02-25 DIAGNOSIS — E041 Nontoxic single thyroid nodule: Secondary | ICD-10-CM | POA: Diagnosis not present

## 2020-02-25 DIAGNOSIS — E43 Unspecified severe protein-calorie malnutrition: Secondary | ICD-10-CM | POA: Diagnosis not present

## 2020-02-25 DIAGNOSIS — C9002 Multiple myeloma in relapse: Secondary | ICD-10-CM | POA: Diagnosis not present

## 2020-02-25 DIAGNOSIS — J019 Acute sinusitis, unspecified: Secondary | ICD-10-CM | POA: Diagnosis not present

## 2020-02-25 DIAGNOSIS — F5104 Psychophysiologic insomnia: Secondary | ICD-10-CM | POA: Diagnosis not present

## 2020-02-25 DIAGNOSIS — M4185 Other forms of scoliosis, thoracolumbar region: Secondary | ICD-10-CM | POA: Diagnosis not present

## 2020-02-25 DIAGNOSIS — F3113 Bipolar disorder, current episode manic without psychotic features, severe: Secondary | ICD-10-CM | POA: Diagnosis not present

## 2020-02-25 DIAGNOSIS — D500 Iron deficiency anemia secondary to blood loss (chronic): Secondary | ICD-10-CM | POA: Diagnosis not present

## 2020-02-25 DIAGNOSIS — M75102 Unspecified rotator cuff tear or rupture of left shoulder, not specified as traumatic: Secondary | ICD-10-CM | POA: Diagnosis not present

## 2020-02-25 DIAGNOSIS — H0015 Chalazion left lower eyelid: Secondary | ICD-10-CM | POA: Diagnosis not present

## 2020-02-25 DIAGNOSIS — F331 Major depressive disorder, recurrent, moderate: Secondary | ICD-10-CM | POA: Diagnosis not present

## 2020-02-25 DIAGNOSIS — K6289 Other specified diseases of anus and rectum: Secondary | ICD-10-CM | POA: Diagnosis not present

## 2020-02-25 DIAGNOSIS — R42 Dizziness and giddiness: Secondary | ICD-10-CM | POA: Diagnosis not present

## 2020-02-25 DIAGNOSIS — G3183 Dementia with Lewy bodies: Secondary | ICD-10-CM | POA: Diagnosis not present

## 2020-02-25 DIAGNOSIS — L84 Corns and callosities: Secondary | ICD-10-CM | POA: Diagnosis not present

## 2020-02-25 DIAGNOSIS — I8312 Varicose veins of left lower extremity with inflammation: Secondary | ICD-10-CM | POA: Diagnosis not present

## 2020-02-25 DIAGNOSIS — M25462 Effusion, left knee: Secondary | ICD-10-CM | POA: Diagnosis not present

## 2020-02-25 DIAGNOSIS — Z96653 Presence of artificial knee joint, bilateral: Secondary | ICD-10-CM | POA: Diagnosis not present

## 2020-02-25 DIAGNOSIS — Z6832 Body mass index (BMI) 32.0-32.9, adult: Secondary | ICD-10-CM | POA: Diagnosis not present

## 2020-02-25 DIAGNOSIS — W19XXXS Unspecified fall, sequela: Secondary | ICD-10-CM | POA: Diagnosis not present

## 2020-02-25 DIAGNOSIS — H35423 Microcystoid degeneration of retina, bilateral: Secondary | ICD-10-CM | POA: Diagnosis not present

## 2020-02-25 DIAGNOSIS — L89150 Pressure ulcer of sacral region, unstageable: Secondary | ICD-10-CM | POA: Diagnosis not present

## 2020-02-25 DIAGNOSIS — Z111 Encounter for screening for respiratory tuberculosis: Secondary | ICD-10-CM | POA: Diagnosis not present

## 2020-02-25 DIAGNOSIS — H35033 Hypertensive retinopathy, bilateral: Secondary | ICD-10-CM | POA: Diagnosis not present

## 2020-02-25 DIAGNOSIS — G5630 Lesion of radial nerve, unspecified upper limb: Secondary | ICD-10-CM | POA: Diagnosis not present

## 2020-02-25 DIAGNOSIS — E222 Syndrome of inappropriate secretion of antidiuretic hormone: Secondary | ICD-10-CM | POA: Diagnosis not present

## 2020-02-25 DIAGNOSIS — Z95820 Peripheral vascular angioplasty status with implants and grafts: Secondary | ICD-10-CM | POA: Diagnosis not present

## 2020-02-25 DIAGNOSIS — H5203 Hypermetropia, bilateral: Secondary | ICD-10-CM | POA: Diagnosis not present

## 2020-02-25 DIAGNOSIS — M4722 Other spondylosis with radiculopathy, cervical region: Secondary | ICD-10-CM | POA: Diagnosis not present

## 2020-02-25 DIAGNOSIS — J45909 Unspecified asthma, uncomplicated: Secondary | ICD-10-CM | POA: Diagnosis not present

## 2020-02-25 DIAGNOSIS — H02834 Dermatochalasis of left upper eyelid: Secondary | ICD-10-CM | POA: Diagnosis not present

## 2020-02-25 DIAGNOSIS — N9089 Other specified noninflammatory disorders of vulva and perineum: Secondary | ICD-10-CM | POA: Diagnosis not present

## 2020-02-25 DIAGNOSIS — R413 Other amnesia: Secondary | ICD-10-CM | POA: Diagnosis not present

## 2020-02-25 DIAGNOSIS — M4856XA Collapsed vertebra, not elsewhere classified, lumbar region, initial encounter for fracture: Secondary | ICD-10-CM | POA: Diagnosis not present

## 2020-02-25 DIAGNOSIS — X32XXXA Exposure to sunlight, initial encounter: Secondary | ICD-10-CM | POA: Diagnosis not present

## 2020-02-25 DIAGNOSIS — Z1231 Encounter for screening mammogram for malignant neoplasm of breast: Secondary | ICD-10-CM | POA: Diagnosis not present

## 2020-02-25 DIAGNOSIS — N3000 Acute cystitis without hematuria: Secondary | ICD-10-CM | POA: Diagnosis not present

## 2020-02-25 DIAGNOSIS — M4316 Spondylolisthesis, lumbar region: Secondary | ICD-10-CM | POA: Diagnosis not present

## 2020-02-25 DIAGNOSIS — R1313 Dysphagia, pharyngeal phase: Secondary | ICD-10-CM | POA: Diagnosis not present

## 2020-02-25 DIAGNOSIS — K7460 Unspecified cirrhosis of liver: Secondary | ICD-10-CM | POA: Diagnosis not present

## 2020-02-25 DIAGNOSIS — N182 Chronic kidney disease, stage 2 (mild): Secondary | ICD-10-CM | POA: Diagnosis not present

## 2020-02-25 DIAGNOSIS — H0288A Meibomian gland dysfunction right eye, upper and lower eyelids: Secondary | ICD-10-CM | POA: Diagnosis not present

## 2020-02-25 DIAGNOSIS — E890 Postprocedural hypothyroidism: Secondary | ICD-10-CM | POA: Diagnosis not present

## 2020-02-25 DIAGNOSIS — C159 Malignant neoplasm of esophagus, unspecified: Secondary | ICD-10-CM | POA: Diagnosis not present

## 2020-02-25 DIAGNOSIS — I7300 Raynaud's syndrome without gangrene: Secondary | ICD-10-CM | POA: Diagnosis not present

## 2020-02-25 DIAGNOSIS — R252 Cramp and spasm: Secondary | ICD-10-CM | POA: Diagnosis not present

## 2020-02-25 DIAGNOSIS — D6489 Other specified anemias: Secondary | ICD-10-CM | POA: Diagnosis not present

## 2020-02-25 DIAGNOSIS — H401121 Primary open-angle glaucoma, left eye, mild stage: Secondary | ICD-10-CM | POA: Diagnosis not present

## 2020-02-25 DIAGNOSIS — S22000S Wedge compression fracture of unspecified thoracic vertebra, sequela: Secondary | ICD-10-CM | POA: Diagnosis not present

## 2020-02-25 DIAGNOSIS — M545 Low back pain: Secondary | ICD-10-CM | POA: Diagnosis not present

## 2020-02-25 DIAGNOSIS — E039 Hypothyroidism, unspecified: Secondary | ICD-10-CM | POA: Diagnosis not present

## 2020-02-25 DIAGNOSIS — I213 ST elevation (STEMI) myocardial infarction of unspecified site: Secondary | ICD-10-CM | POA: Diagnosis not present

## 2020-02-25 DIAGNOSIS — Z7409 Other reduced mobility: Secondary | ICD-10-CM | POA: Diagnosis not present

## 2020-02-25 DIAGNOSIS — R102 Pelvic and perineal pain: Secondary | ICD-10-CM | POA: Diagnosis not present

## 2020-02-25 DIAGNOSIS — K5130 Ulcerative (chronic) rectosigmoiditis without complications: Secondary | ICD-10-CM | POA: Diagnosis not present

## 2020-02-25 DIAGNOSIS — M778 Other enthesopathies, not elsewhere classified: Secondary | ICD-10-CM | POA: Diagnosis not present

## 2020-02-25 DIAGNOSIS — H52203 Unspecified astigmatism, bilateral: Secondary | ICD-10-CM | POA: Diagnosis not present

## 2020-02-25 DIAGNOSIS — R0902 Hypoxemia: Secondary | ICD-10-CM | POA: Diagnosis not present

## 2020-02-25 DIAGNOSIS — L89312 Pressure ulcer of right buttock, stage 2: Secondary | ICD-10-CM | POA: Diagnosis not present

## 2020-02-25 DIAGNOSIS — R270 Ataxia, unspecified: Secondary | ICD-10-CM | POA: Diagnosis not present

## 2020-02-25 DIAGNOSIS — G6181 Chronic inflammatory demyelinating polyneuritis: Secondary | ICD-10-CM | POA: Diagnosis not present

## 2020-02-25 DIAGNOSIS — M0579 Rheumatoid arthritis with rheumatoid factor of multiple sites without organ or systems involvement: Secondary | ICD-10-CM | POA: Diagnosis not present

## 2020-02-25 DIAGNOSIS — J3089 Other allergic rhinitis: Secondary | ICD-10-CM | POA: Diagnosis not present

## 2020-02-25 DIAGNOSIS — M25552 Pain in left hip: Secondary | ICD-10-CM | POA: Diagnosis not present

## 2020-02-25 DIAGNOSIS — M9902 Segmental and somatic dysfunction of thoracic region: Secondary | ICD-10-CM | POA: Diagnosis not present

## 2020-02-25 DIAGNOSIS — M1712 Unilateral primary osteoarthritis, left knee: Secondary | ICD-10-CM | POA: Diagnosis not present

## 2020-02-25 DIAGNOSIS — J329 Chronic sinusitis, unspecified: Secondary | ICD-10-CM | POA: Diagnosis not present

## 2020-02-25 DIAGNOSIS — L309 Dermatitis, unspecified: Secondary | ICD-10-CM | POA: Diagnosis not present

## 2020-02-25 DIAGNOSIS — C673 Malignant neoplasm of anterior wall of bladder: Secondary | ICD-10-CM | POA: Diagnosis not present

## 2020-02-25 DIAGNOSIS — Z01419 Encounter for gynecological examination (general) (routine) without abnormal findings: Secondary | ICD-10-CM | POA: Diagnosis not present

## 2020-02-25 DIAGNOSIS — I6300 Cerebral infarction due to thrombosis of unspecified precerebral artery: Secondary | ICD-10-CM | POA: Diagnosis not present

## 2020-02-25 DIAGNOSIS — G932 Benign intracranial hypertension: Secondary | ICD-10-CM | POA: Diagnosis not present

## 2020-02-25 DIAGNOSIS — G9009 Other idiopathic peripheral autonomic neuropathy: Secondary | ICD-10-CM | POA: Diagnosis not present

## 2020-02-25 DIAGNOSIS — Z6829 Body mass index (BMI) 29.0-29.9, adult: Secondary | ICD-10-CM | POA: Diagnosis not present

## 2020-02-25 DIAGNOSIS — S0191XA Laceration without foreign body of unspecified part of head, initial encounter: Secondary | ICD-10-CM | POA: Diagnosis not present

## 2020-02-25 DIAGNOSIS — H3581 Retinal edema: Secondary | ICD-10-CM | POA: Diagnosis not present

## 2020-02-25 DIAGNOSIS — S81802A Unspecified open wound, left lower leg, initial encounter: Secondary | ICD-10-CM | POA: Diagnosis not present

## 2020-02-25 DIAGNOSIS — F17210 Nicotine dependence, cigarettes, uncomplicated: Secondary | ICD-10-CM | POA: Diagnosis not present

## 2020-02-25 DIAGNOSIS — G5601 Carpal tunnel syndrome, right upper limb: Secondary | ICD-10-CM | POA: Diagnosis not present

## 2020-02-25 DIAGNOSIS — L821 Other seborrheic keratosis: Secondary | ICD-10-CM | POA: Diagnosis not present

## 2020-02-25 DIAGNOSIS — H35352 Cystoid macular degeneration, left eye: Secondary | ICD-10-CM | POA: Diagnosis not present

## 2020-02-25 DIAGNOSIS — G35 Multiple sclerosis: Secondary | ICD-10-CM | POA: Diagnosis not present

## 2020-02-25 DIAGNOSIS — Z6821 Body mass index (BMI) 21.0-21.9, adult: Secondary | ICD-10-CM | POA: Diagnosis not present

## 2020-02-25 DIAGNOSIS — R61 Generalized hyperhidrosis: Secondary | ICD-10-CM | POA: Diagnosis not present

## 2020-02-25 DIAGNOSIS — N3946 Mixed incontinence: Secondary | ICD-10-CM | POA: Diagnosis not present

## 2020-02-25 DIAGNOSIS — N184 Chronic kidney disease, stage 4 (severe): Secondary | ICD-10-CM | POA: Diagnosis not present

## 2020-02-25 DIAGNOSIS — M75112 Incomplete rotator cuff tear or rupture of left shoulder, not specified as traumatic: Secondary | ICD-10-CM | POA: Diagnosis not present

## 2020-02-25 DIAGNOSIS — H8113 Benign paroxysmal vertigo, bilateral: Secondary | ICD-10-CM | POA: Diagnosis not present

## 2020-02-25 DIAGNOSIS — Z6820 Body mass index (BMI) 20.0-20.9, adult: Secondary | ICD-10-CM | POA: Diagnosis not present

## 2020-02-25 DIAGNOSIS — M7582 Other shoulder lesions, left shoulder: Secondary | ICD-10-CM | POA: Diagnosis not present

## 2020-02-25 DIAGNOSIS — L97511 Non-pressure chronic ulcer of other part of right foot limited to breakdown of skin: Secondary | ICD-10-CM | POA: Diagnosis not present

## 2020-02-25 DIAGNOSIS — J432 Centrilobular emphysema: Secondary | ICD-10-CM | POA: Diagnosis not present

## 2020-02-25 DIAGNOSIS — R0602 Shortness of breath: Secondary | ICD-10-CM | POA: Diagnosis not present

## 2020-02-25 DIAGNOSIS — M4802 Spinal stenosis, cervical region: Secondary | ICD-10-CM | POA: Diagnosis not present

## 2020-02-25 DIAGNOSIS — M62830 Muscle spasm of back: Secondary | ICD-10-CM | POA: Diagnosis not present

## 2020-02-25 DIAGNOSIS — M7542 Impingement syndrome of left shoulder: Secondary | ICD-10-CM | POA: Diagnosis not present

## 2020-02-25 DIAGNOSIS — E11311 Type 2 diabetes mellitus with unspecified diabetic retinopathy with macular edema: Secondary | ICD-10-CM | POA: Diagnosis not present

## 2020-02-25 DIAGNOSIS — R21 Rash and other nonspecific skin eruption: Secondary | ICD-10-CM | POA: Diagnosis not present

## 2020-02-25 DIAGNOSIS — Z720 Tobacco use: Secondary | ICD-10-CM | POA: Diagnosis not present

## 2020-02-25 DIAGNOSIS — F322 Major depressive disorder, single episode, severe without psychotic features: Secondary | ICD-10-CM | POA: Diagnosis not present

## 2020-02-25 DIAGNOSIS — R1013 Epigastric pain: Secondary | ICD-10-CM | POA: Diagnosis not present

## 2020-02-25 DIAGNOSIS — N644 Mastodynia: Secondary | ICD-10-CM | POA: Diagnosis not present

## 2020-02-25 DIAGNOSIS — K5731 Diverticulosis of large intestine without perforation or abscess with bleeding: Secondary | ICD-10-CM | POA: Diagnosis not present

## 2020-02-25 DIAGNOSIS — M069 Rheumatoid arthritis, unspecified: Secondary | ICD-10-CM | POA: Diagnosis not present

## 2020-02-25 DIAGNOSIS — Z780 Asymptomatic menopausal state: Secondary | ICD-10-CM | POA: Diagnosis not present

## 2020-02-25 DIAGNOSIS — R062 Wheezing: Secondary | ICD-10-CM | POA: Diagnosis not present

## 2020-02-25 DIAGNOSIS — A4901 Methicillin susceptible Staphylococcus aureus infection, unspecified site: Secondary | ICD-10-CM | POA: Diagnosis not present

## 2020-02-25 DIAGNOSIS — Z0189 Encounter for other specified special examinations: Secondary | ICD-10-CM | POA: Diagnosis not present

## 2020-02-25 DIAGNOSIS — K641 Second degree hemorrhoids: Secondary | ICD-10-CM | POA: Diagnosis not present

## 2020-02-25 DIAGNOSIS — S76011D Strain of muscle, fascia and tendon of right hip, subsequent encounter: Secondary | ICD-10-CM | POA: Diagnosis not present

## 2020-02-25 DIAGNOSIS — N3001 Acute cystitis with hematuria: Secondary | ICD-10-CM | POA: Diagnosis not present

## 2020-02-25 DIAGNOSIS — R002 Palpitations: Secondary | ICD-10-CM | POA: Diagnosis not present

## 2020-02-25 DIAGNOSIS — J301 Allergic rhinitis due to pollen: Secondary | ICD-10-CM | POA: Diagnosis not present

## 2020-02-25 DIAGNOSIS — L989 Disorder of the skin and subcutaneous tissue, unspecified: Secondary | ICD-10-CM | POA: Diagnosis not present

## 2020-02-25 DIAGNOSIS — R000 Tachycardia, unspecified: Secondary | ICD-10-CM | POA: Diagnosis not present

## 2020-02-25 DIAGNOSIS — M7711 Lateral epicondylitis, right elbow: Secondary | ICD-10-CM | POA: Diagnosis not present

## 2020-02-25 DIAGNOSIS — H5213 Myopia, bilateral: Secondary | ICD-10-CM | POA: Diagnosis not present

## 2020-02-25 DIAGNOSIS — Z961 Presence of intraocular lens: Secondary | ICD-10-CM | POA: Diagnosis not present

## 2020-02-25 DIAGNOSIS — S52502A Unspecified fracture of the lower end of left radius, initial encounter for closed fracture: Secondary | ICD-10-CM | POA: Diagnosis not present

## 2020-02-25 DIAGNOSIS — M47814 Spondylosis without myelopathy or radiculopathy, thoracic region: Secondary | ICD-10-CM | POA: Diagnosis not present

## 2020-02-25 DIAGNOSIS — I259 Chronic ischemic heart disease, unspecified: Secondary | ICD-10-CM | POA: Diagnosis not present

## 2020-02-25 DIAGNOSIS — J9811 Atelectasis: Secondary | ICD-10-CM | POA: Diagnosis not present

## 2020-02-25 DIAGNOSIS — B965 Pseudomonas (aeruginosa) (mallei) (pseudomallei) as the cause of diseases classified elsewhere: Secondary | ICD-10-CM | POA: Diagnosis not present

## 2020-02-25 DIAGNOSIS — I10 Essential (primary) hypertension: Secondary | ICD-10-CM | POA: Diagnosis not present

## 2020-02-25 DIAGNOSIS — K2270 Barrett's esophagus without dysplasia: Secondary | ICD-10-CM | POA: Diagnosis not present

## 2020-02-25 DIAGNOSIS — C675 Malignant neoplasm of bladder neck: Secondary | ICD-10-CM | POA: Diagnosis not present

## 2020-02-25 DIAGNOSIS — K4090 Unilateral inguinal hernia, without obstruction or gangrene, not specified as recurrent: Secondary | ICD-10-CM | POA: Diagnosis not present

## 2020-02-25 DIAGNOSIS — D6481 Anemia due to antineoplastic chemotherapy: Secondary | ICD-10-CM | POA: Diagnosis not present

## 2020-02-25 DIAGNOSIS — S82832K Other fracture of upper and lower end of left fibula, subsequent encounter for closed fracture with nonunion: Secondary | ICD-10-CM | POA: Diagnosis not present

## 2020-02-25 DIAGNOSIS — R296 Repeated falls: Secondary | ICD-10-CM | POA: Diagnosis not present

## 2020-02-25 DIAGNOSIS — C023 Malignant neoplasm of anterior two-thirds of tongue, part unspecified: Secondary | ICD-10-CM | POA: Diagnosis not present

## 2020-02-25 DIAGNOSIS — F17218 Nicotine dependence, cigarettes, with other nicotine-induced disorders: Secondary | ICD-10-CM | POA: Diagnosis not present

## 2020-02-25 DIAGNOSIS — E673 Hypervitaminosis D: Secondary | ICD-10-CM | POA: Diagnosis not present

## 2020-02-25 DIAGNOSIS — R4182 Altered mental status, unspecified: Secondary | ICD-10-CM | POA: Diagnosis not present

## 2020-02-25 DIAGNOSIS — F330 Major depressive disorder, recurrent, mild: Secondary | ICD-10-CM | POA: Diagnosis not present

## 2020-02-25 DIAGNOSIS — I69398 Other sequelae of cerebral infarction: Secondary | ICD-10-CM | POA: Diagnosis not present

## 2020-02-25 DIAGNOSIS — E119 Type 2 diabetes mellitus without complications: Secondary | ICD-10-CM | POA: Diagnosis not present

## 2020-02-25 DIAGNOSIS — Z89511 Acquired absence of right leg below knee: Secondary | ICD-10-CM | POA: Diagnosis not present

## 2020-02-25 DIAGNOSIS — R5383 Other fatigue: Secondary | ICD-10-CM | POA: Diagnosis not present

## 2020-02-25 DIAGNOSIS — R809 Proteinuria, unspecified: Secondary | ICD-10-CM | POA: Diagnosis not present

## 2020-02-25 DIAGNOSIS — H353114 Nonexudative age-related macular degeneration, right eye, advanced atrophic with subfoveal involvement: Secondary | ICD-10-CM | POA: Diagnosis not present

## 2020-02-25 DIAGNOSIS — E871 Hypo-osmolality and hyponatremia: Secondary | ICD-10-CM | POA: Diagnosis not present

## 2020-02-25 DIAGNOSIS — E1151 Type 2 diabetes mellitus with diabetic peripheral angiopathy without gangrene: Secondary | ICD-10-CM | POA: Diagnosis not present

## 2020-02-25 DIAGNOSIS — J4550 Severe persistent asthma, uncomplicated: Secondary | ICD-10-CM | POA: Diagnosis not present

## 2020-02-25 DIAGNOSIS — R921 Mammographic calcification found on diagnostic imaging of breast: Secondary | ICD-10-CM | POA: Diagnosis not present

## 2020-02-25 DIAGNOSIS — J431 Panlobular emphysema: Secondary | ICD-10-CM | POA: Diagnosis not present

## 2020-02-25 DIAGNOSIS — G309 Alzheimer's disease, unspecified: Secondary | ICD-10-CM | POA: Diagnosis not present

## 2020-02-25 DIAGNOSIS — I5041 Acute combined systolic (congestive) and diastolic (congestive) heart failure: Secondary | ICD-10-CM | POA: Diagnosis not present

## 2020-02-25 DIAGNOSIS — M5126 Other intervertebral disc displacement, lumbar region: Secondary | ICD-10-CM | POA: Diagnosis not present

## 2020-02-25 DIAGNOSIS — K635 Polyp of colon: Secondary | ICD-10-CM | POA: Diagnosis not present

## 2020-02-25 DIAGNOSIS — H33321 Round hole, right eye: Secondary | ICD-10-CM | POA: Diagnosis not present

## 2020-02-25 DIAGNOSIS — H34211 Partial retinal artery occlusion, right eye: Secondary | ICD-10-CM | POA: Diagnosis not present

## 2020-02-25 DIAGNOSIS — D128 Benign neoplasm of rectum: Secondary | ICD-10-CM | POA: Diagnosis not present

## 2020-02-25 DIAGNOSIS — G4700 Insomnia, unspecified: Secondary | ICD-10-CM | POA: Diagnosis not present

## 2020-02-25 DIAGNOSIS — J4520 Mild intermittent asthma, uncomplicated: Secondary | ICD-10-CM | POA: Diagnosis not present

## 2020-02-25 DIAGNOSIS — I480 Paroxysmal atrial fibrillation: Secondary | ICD-10-CM | POA: Diagnosis not present

## 2020-02-25 DIAGNOSIS — H43813 Vitreous degeneration, bilateral: Secondary | ICD-10-CM | POA: Diagnosis not present

## 2020-02-25 DIAGNOSIS — R338 Other retention of urine: Secondary | ICD-10-CM | POA: Diagnosis not present

## 2020-02-25 DIAGNOSIS — M169 Osteoarthritis of hip, unspecified: Secondary | ICD-10-CM | POA: Diagnosis not present

## 2020-02-25 DIAGNOSIS — M25661 Stiffness of right knee, not elsewhere classified: Secondary | ICD-10-CM | POA: Diagnosis not present

## 2020-02-25 DIAGNOSIS — M542 Cervicalgia: Secondary | ICD-10-CM | POA: Diagnosis not present

## 2020-02-25 DIAGNOSIS — M25562 Pain in left knee: Secondary | ICD-10-CM | POA: Diagnosis not present

## 2020-02-25 DIAGNOSIS — D696 Thrombocytopenia, unspecified: Secondary | ICD-10-CM | POA: Diagnosis not present

## 2020-02-25 DIAGNOSIS — R7881 Bacteremia: Secondary | ICD-10-CM | POA: Diagnosis not present

## 2020-02-25 DIAGNOSIS — E042 Nontoxic multinodular goiter: Secondary | ICD-10-CM | POA: Diagnosis not present

## 2020-02-25 DIAGNOSIS — F40240 Claustrophobia: Secondary | ICD-10-CM | POA: Diagnosis not present

## 2020-02-25 DIAGNOSIS — R54 Age-related physical debility: Secondary | ICD-10-CM | POA: Diagnosis not present

## 2020-02-25 DIAGNOSIS — Z8639 Personal history of other endocrine, nutritional and metabolic disease: Secondary | ICD-10-CM | POA: Diagnosis not present

## 2020-02-25 DIAGNOSIS — F329 Major depressive disorder, single episode, unspecified: Secondary | ICD-10-CM | POA: Diagnosis not present

## 2020-02-25 DIAGNOSIS — M1A00X0 Idiopathic chronic gout, unspecified site, without tophus (tophi): Secondary | ICD-10-CM | POA: Diagnosis not present

## 2020-02-25 DIAGNOSIS — Z96612 Presence of left artificial shoulder joint: Secondary | ICD-10-CM | POA: Diagnosis not present

## 2020-02-25 DIAGNOSIS — I4510 Unspecified right bundle-branch block: Secondary | ICD-10-CM | POA: Diagnosis not present

## 2020-02-25 DIAGNOSIS — K7689 Other specified diseases of liver: Secondary | ICD-10-CM | POA: Diagnosis not present

## 2020-02-25 DIAGNOSIS — E440 Moderate protein-calorie malnutrition: Secondary | ICD-10-CM | POA: Diagnosis not present

## 2020-02-25 DIAGNOSIS — M4712 Other spondylosis with myelopathy, cervical region: Secondary | ICD-10-CM | POA: Diagnosis not present

## 2020-02-25 DIAGNOSIS — H61031 Chondritis of right external ear: Secondary | ICD-10-CM | POA: Diagnosis not present

## 2020-02-25 DIAGNOSIS — F259 Schizoaffective disorder, unspecified: Secondary | ICD-10-CM | POA: Diagnosis not present

## 2020-02-25 DIAGNOSIS — J3081 Allergic rhinitis due to animal (cat) (dog) hair and dander: Secondary | ICD-10-CM | POA: Diagnosis not present

## 2020-02-25 DIAGNOSIS — M4186 Other forms of scoliosis, lumbar region: Secondary | ICD-10-CM | POA: Diagnosis not present

## 2020-02-25 DIAGNOSIS — S46812A Strain of other muscles, fascia and tendons at shoulder and upper arm level, left arm, initial encounter: Secondary | ICD-10-CM | POA: Diagnosis not present

## 2020-02-25 DIAGNOSIS — C9210 Chronic myeloid leukemia, BCR/ABL-positive, not having achieved remission: Secondary | ICD-10-CM | POA: Diagnosis not present

## 2020-02-25 DIAGNOSIS — T85193A Other mechanical complication of implanted electronic neurostimulator, generator, initial encounter: Secondary | ICD-10-CM | POA: Diagnosis not present

## 2020-02-25 DIAGNOSIS — H353231 Exudative age-related macular degeneration, bilateral, with active choroidal neovascularization: Secondary | ICD-10-CM | POA: Diagnosis not present

## 2020-02-25 DIAGNOSIS — M76821 Posterior tibial tendinitis, right leg: Secondary | ICD-10-CM | POA: Diagnosis not present

## 2020-02-25 DIAGNOSIS — S134XXA Sprain of ligaments of cervical spine, initial encounter: Secondary | ICD-10-CM | POA: Diagnosis not present

## 2020-02-25 DIAGNOSIS — D2271 Melanocytic nevi of right lower limb, including hip: Secondary | ICD-10-CM | POA: Diagnosis not present

## 2020-02-25 DIAGNOSIS — Z20822 Contact with and (suspected) exposure to covid-19: Secondary | ICD-10-CM | POA: Diagnosis not present

## 2020-02-25 DIAGNOSIS — E1142 Type 2 diabetes mellitus with diabetic polyneuropathy: Secondary | ICD-10-CM | POA: Diagnosis not present

## 2020-02-25 DIAGNOSIS — H6983 Other specified disorders of Eustachian tube, bilateral: Secondary | ICD-10-CM | POA: Diagnosis not present

## 2020-02-25 DIAGNOSIS — J849 Interstitial pulmonary disease, unspecified: Secondary | ICD-10-CM | POA: Diagnosis not present

## 2020-02-25 DIAGNOSIS — M25859 Other specified joint disorders, unspecified hip: Secondary | ICD-10-CM | POA: Diagnosis not present

## 2020-02-25 DIAGNOSIS — F5089 Other specified eating disorder: Secondary | ICD-10-CM | POA: Diagnosis not present

## 2020-02-25 DIAGNOSIS — D485 Neoplasm of uncertain behavior of skin: Secondary | ICD-10-CM | POA: Diagnosis not present

## 2020-02-25 DIAGNOSIS — G40009 Localization-related (focal) (partial) idiopathic epilepsy and epileptic syndromes with seizures of localized onset, not intractable, without status epilepticus: Secondary | ICD-10-CM | POA: Diagnosis not present

## 2020-02-25 DIAGNOSIS — M5116 Intervertebral disc disorders with radiculopathy, lumbar region: Secondary | ICD-10-CM | POA: Diagnosis not present

## 2020-02-25 DIAGNOSIS — G4730 Sleep apnea, unspecified: Secondary | ICD-10-CM | POA: Diagnosis not present

## 2020-02-25 DIAGNOSIS — I4811 Longstanding persistent atrial fibrillation: Secondary | ICD-10-CM | POA: Diagnosis not present

## 2020-02-25 DIAGNOSIS — N136 Pyonephrosis: Secondary | ICD-10-CM | POA: Diagnosis not present

## 2020-02-25 DIAGNOSIS — G2581 Restless legs syndrome: Secondary | ICD-10-CM | POA: Diagnosis not present

## 2020-02-25 DIAGNOSIS — E7801 Familial hypercholesterolemia: Secondary | ICD-10-CM | POA: Diagnosis not present

## 2020-02-25 DIAGNOSIS — I5020 Unspecified systolic (congestive) heart failure: Secondary | ICD-10-CM | POA: Diagnosis not present

## 2020-02-25 DIAGNOSIS — R911 Solitary pulmonary nodule: Secondary | ICD-10-CM | POA: Diagnosis not present

## 2020-02-25 DIAGNOSIS — I729 Aneurysm of unspecified site: Secondary | ICD-10-CM | POA: Diagnosis not present

## 2020-02-25 DIAGNOSIS — I779 Disorder of arteries and arterioles, unspecified: Secondary | ICD-10-CM | POA: Diagnosis not present

## 2020-02-25 DIAGNOSIS — K754 Autoimmune hepatitis: Secondary | ICD-10-CM | POA: Diagnosis not present

## 2020-02-25 DIAGNOSIS — R791 Abnormal coagulation profile: Secondary | ICD-10-CM | POA: Diagnosis not present

## 2020-02-25 DIAGNOSIS — H353112 Nonexudative age-related macular degeneration, right eye, intermediate dry stage: Secondary | ICD-10-CM | POA: Diagnosis not present

## 2020-02-25 DIAGNOSIS — I739 Peripheral vascular disease, unspecified: Secondary | ICD-10-CM | POA: Diagnosis not present

## 2020-02-25 DIAGNOSIS — N179 Acute kidney failure, unspecified: Secondary | ICD-10-CM | POA: Diagnosis not present

## 2020-02-25 DIAGNOSIS — Z515 Encounter for palliative care: Secondary | ICD-10-CM | POA: Diagnosis not present

## 2020-02-25 DIAGNOSIS — Z2239 Carrier of other specified bacterial diseases: Secondary | ICD-10-CM | POA: Diagnosis not present

## 2020-02-25 DIAGNOSIS — M47816 Spondylosis without myelopathy or radiculopathy, lumbar region: Secondary | ICD-10-CM | POA: Diagnosis not present

## 2020-02-25 DIAGNOSIS — W19XXXA Unspecified fall, initial encounter: Secondary | ICD-10-CM | POA: Diagnosis not present

## 2020-02-25 DIAGNOSIS — M25512 Pain in left shoulder: Secondary | ICD-10-CM | POA: Diagnosis not present

## 2020-02-25 DIAGNOSIS — R627 Adult failure to thrive: Secondary | ICD-10-CM | POA: Diagnosis not present

## 2020-02-25 DIAGNOSIS — Z20828 Contact with and (suspected) exposure to other viral communicable diseases: Secondary | ICD-10-CM | POA: Diagnosis not present

## 2020-02-25 DIAGNOSIS — H11823 Conjunctivochalasis, bilateral: Secondary | ICD-10-CM | POA: Diagnosis not present

## 2020-02-25 DIAGNOSIS — F3289 Other specified depressive episodes: Secondary | ICD-10-CM | POA: Diagnosis not present

## 2020-02-25 DIAGNOSIS — I82443 Acute embolism and thrombosis of tibial vein, bilateral: Secondary | ICD-10-CM | POA: Diagnosis not present

## 2020-02-25 DIAGNOSIS — I495 Sick sinus syndrome: Secondary | ICD-10-CM | POA: Diagnosis not present

## 2020-02-25 DIAGNOSIS — N399 Disorder of urinary system, unspecified: Secondary | ICD-10-CM | POA: Diagnosis not present

## 2020-02-25 DIAGNOSIS — Z955 Presence of coronary angioplasty implant and graft: Secondary | ICD-10-CM | POA: Diagnosis not present

## 2020-02-25 DIAGNOSIS — M9904 Segmental and somatic dysfunction of sacral region: Secondary | ICD-10-CM | POA: Diagnosis not present

## 2020-02-25 DIAGNOSIS — K648 Other hemorrhoids: Secondary | ICD-10-CM | POA: Diagnosis not present

## 2020-02-25 DIAGNOSIS — R195 Other fecal abnormalities: Secondary | ICD-10-CM | POA: Diagnosis not present

## 2020-02-25 DIAGNOSIS — R251 Tremor, unspecified: Secondary | ICD-10-CM | POA: Diagnosis not present

## 2020-02-25 DIAGNOSIS — E6609 Other obesity due to excess calories: Secondary | ICD-10-CM | POA: Diagnosis not present

## 2020-02-25 DIAGNOSIS — K643 Fourth degree hemorrhoids: Secondary | ICD-10-CM | POA: Diagnosis not present

## 2020-02-25 DIAGNOSIS — K449 Diaphragmatic hernia without obstruction or gangrene: Secondary | ICD-10-CM | POA: Diagnosis not present

## 2020-02-25 DIAGNOSIS — M8580 Other specified disorders of bone density and structure, unspecified site: Secondary | ICD-10-CM | POA: Diagnosis not present

## 2020-02-25 DIAGNOSIS — M461 Sacroiliitis, not elsewhere classified: Secondary | ICD-10-CM | POA: Diagnosis not present

## 2020-02-25 DIAGNOSIS — Z888 Allergy status to other drugs, medicaments and biological substances status: Secondary | ICD-10-CM | POA: Diagnosis not present

## 2020-02-25 DIAGNOSIS — F419 Anxiety disorder, unspecified: Secondary | ICD-10-CM | POA: Diagnosis not present

## 2020-02-25 DIAGNOSIS — R1010 Upper abdominal pain, unspecified: Secondary | ICD-10-CM | POA: Diagnosis not present

## 2020-02-25 DIAGNOSIS — Z95828 Presence of other vascular implants and grafts: Secondary | ICD-10-CM | POA: Diagnosis not present

## 2020-02-25 DIAGNOSIS — C44629 Squamous cell carcinoma of skin of left upper limb, including shoulder: Secondary | ICD-10-CM | POA: Diagnosis not present

## 2020-02-25 DIAGNOSIS — K51219 Ulcerative (chronic) proctitis with unspecified complications: Secondary | ICD-10-CM | POA: Diagnosis not present

## 2020-02-25 DIAGNOSIS — Z4689 Encounter for fitting and adjustment of other specified devices: Secondary | ICD-10-CM | POA: Diagnosis not present

## 2020-02-25 DIAGNOSIS — J690 Pneumonitis due to inhalation of food and vomit: Secondary | ICD-10-CM | POA: Diagnosis not present

## 2020-02-25 DIAGNOSIS — R140 Abdominal distension (gaseous): Secondary | ICD-10-CM | POA: Diagnosis not present

## 2020-02-25 DIAGNOSIS — R109 Unspecified abdominal pain: Secondary | ICD-10-CM | POA: Diagnosis not present

## 2020-02-25 DIAGNOSIS — I341 Nonrheumatic mitral (valve) prolapse: Secondary | ICD-10-CM | POA: Diagnosis not present

## 2020-02-25 DIAGNOSIS — L650 Telogen effluvium: Secondary | ICD-10-CM | POA: Diagnosis not present

## 2020-02-25 DIAGNOSIS — C538 Malignant neoplasm of overlapping sites of cervix uteri: Secondary | ICD-10-CM | POA: Diagnosis not present

## 2020-02-25 DIAGNOSIS — H40012 Open angle with borderline findings, low risk, left eye: Secondary | ICD-10-CM | POA: Diagnosis not present

## 2020-02-25 DIAGNOSIS — Z880 Allergy status to penicillin: Secondary | ICD-10-CM | POA: Diagnosis not present

## 2020-02-25 DIAGNOSIS — M9903 Segmental and somatic dysfunction of lumbar region: Secondary | ICD-10-CM | POA: Diagnosis not present

## 2020-02-25 DIAGNOSIS — Z5321 Procedure and treatment not carried out due to patient leaving prior to being seen by health care provider: Secondary | ICD-10-CM | POA: Diagnosis not present

## 2020-02-25 DIAGNOSIS — H1010 Acute atopic conjunctivitis, unspecified eye: Secondary | ICD-10-CM | POA: Diagnosis not present

## 2020-02-25 DIAGNOSIS — J45990 Exercise induced bronchospasm: Secondary | ICD-10-CM | POA: Diagnosis not present

## 2020-02-25 DIAGNOSIS — S42201A Unspecified fracture of upper end of right humerus, initial encounter for closed fracture: Secondary | ICD-10-CM | POA: Diagnosis not present

## 2020-02-25 DIAGNOSIS — Z08 Encounter for follow-up examination after completed treatment for malignant neoplasm: Secondary | ICD-10-CM | POA: Diagnosis not present

## 2020-02-25 DIAGNOSIS — N130 Hydronephrosis with ureteropelvic junction obstruction: Secondary | ICD-10-CM | POA: Diagnosis not present

## 2020-02-25 DIAGNOSIS — M21371 Foot drop, right foot: Secondary | ICD-10-CM | POA: Diagnosis not present

## 2020-02-25 DIAGNOSIS — J40 Bronchitis, not specified as acute or chronic: Secondary | ICD-10-CM | POA: Diagnosis not present

## 2020-02-25 DIAGNOSIS — N360 Urethral fistula: Secondary | ICD-10-CM | POA: Diagnosis not present

## 2020-02-25 DIAGNOSIS — I5030 Unspecified diastolic (congestive) heart failure: Secondary | ICD-10-CM | POA: Diagnosis not present

## 2020-02-25 DIAGNOSIS — I5043 Acute on chronic combined systolic (congestive) and diastolic (congestive) heart failure: Secondary | ICD-10-CM | POA: Diagnosis not present

## 2020-02-25 DIAGNOSIS — N6019 Diffuse cystic mastopathy of unspecified breast: Secondary | ICD-10-CM | POA: Diagnosis not present

## 2020-02-25 DIAGNOSIS — S233XXA Sprain of ligaments of thoracic spine, initial encounter: Secondary | ICD-10-CM | POA: Diagnosis not present

## 2020-02-25 DIAGNOSIS — C678 Malignant neoplasm of overlapping sites of bladder: Secondary | ICD-10-CM | POA: Diagnosis not present

## 2020-02-25 DIAGNOSIS — R52 Pain, unspecified: Secondary | ICD-10-CM | POA: Diagnosis not present

## 2020-02-25 DIAGNOSIS — R7309 Other abnormal glucose: Secondary | ICD-10-CM | POA: Diagnosis not present

## 2020-02-25 DIAGNOSIS — Z882 Allergy status to sulfonamides status: Secondary | ICD-10-CM | POA: Diagnosis not present

## 2020-02-25 DIAGNOSIS — T6701XA Heatstroke and sunstroke, initial encounter: Secondary | ICD-10-CM | POA: Diagnosis not present

## 2020-02-25 DIAGNOSIS — E559 Vitamin D deficiency, unspecified: Secondary | ICD-10-CM | POA: Diagnosis not present

## 2020-02-25 DIAGNOSIS — H9313 Tinnitus, bilateral: Secondary | ICD-10-CM | POA: Diagnosis not present

## 2020-02-25 DIAGNOSIS — G43009 Migraine without aura, not intractable, without status migrainosus: Secondary | ICD-10-CM | POA: Diagnosis not present

## 2020-02-25 DIAGNOSIS — Z1211 Encounter for screening for malignant neoplasm of colon: Secondary | ICD-10-CM | POA: Diagnosis not present

## 2020-02-25 DIAGNOSIS — T83410A Breakdown (mechanical) of penile (implanted) prosthesis, initial encounter: Secondary | ICD-10-CM | POA: Diagnosis not present

## 2020-02-25 DIAGNOSIS — N828 Other female genital tract fistulae: Secondary | ICD-10-CM | POA: Diagnosis not present

## 2020-02-25 DIAGNOSIS — J0100 Acute maxillary sinusitis, unspecified: Secondary | ICD-10-CM | POA: Diagnosis not present

## 2020-02-25 DIAGNOSIS — N1831 Chronic kidney disease, stage 3a: Secondary | ICD-10-CM | POA: Diagnosis not present

## 2020-02-25 DIAGNOSIS — F320 Major depressive disorder, single episode, mild: Secondary | ICD-10-CM | POA: Diagnosis not present

## 2020-02-25 DIAGNOSIS — R042 Hemoptysis: Secondary | ICD-10-CM | POA: Diagnosis not present

## 2020-02-25 DIAGNOSIS — F429 Obsessive-compulsive disorder, unspecified: Secondary | ICD-10-CM | POA: Diagnosis not present

## 2020-02-25 DIAGNOSIS — L97329 Non-pressure chronic ulcer of left ankle with unspecified severity: Secondary | ICD-10-CM | POA: Diagnosis not present

## 2020-02-25 DIAGNOSIS — S80861A Insect bite (nonvenomous), right lower leg, initial encounter: Secondary | ICD-10-CM | POA: Diagnosis not present

## 2020-02-25 DIAGNOSIS — Z85038 Personal history of other malignant neoplasm of large intestine: Secondary | ICD-10-CM | POA: Diagnosis not present

## 2020-02-25 DIAGNOSIS — L661 Lichen planopilaris: Secondary | ICD-10-CM | POA: Diagnosis not present

## 2020-02-25 DIAGNOSIS — S0990XA Unspecified injury of head, initial encounter: Secondary | ICD-10-CM | POA: Diagnosis not present

## 2020-02-25 DIAGNOSIS — G8918 Other acute postprocedural pain: Secondary | ICD-10-CM | POA: Diagnosis not present

## 2020-02-25 DIAGNOSIS — C4491 Basal cell carcinoma of skin, unspecified: Secondary | ICD-10-CM | POA: Diagnosis not present

## 2020-02-25 DIAGNOSIS — M19172 Post-traumatic osteoarthritis, left ankle and foot: Secondary | ICD-10-CM | POA: Diagnosis not present

## 2020-02-25 DIAGNOSIS — J439 Emphysema, unspecified: Secondary | ICD-10-CM | POA: Diagnosis not present

## 2020-02-25 DIAGNOSIS — I447 Left bundle-branch block, unspecified: Secondary | ICD-10-CM | POA: Diagnosis not present

## 2020-02-25 DIAGNOSIS — C50211 Malignant neoplasm of upper-inner quadrant of right female breast: Secondary | ICD-10-CM | POA: Diagnosis not present

## 2020-02-25 DIAGNOSIS — G6289 Other specified polyneuropathies: Secondary | ICD-10-CM | POA: Diagnosis not present

## 2020-02-25 DIAGNOSIS — R0683 Snoring: Secondary | ICD-10-CM | POA: Diagnosis not present

## 2020-02-25 DIAGNOSIS — M7052 Other bursitis of knee, left knee: Secondary | ICD-10-CM | POA: Diagnosis not present

## 2020-02-25 DIAGNOSIS — S0230XA Fracture of orbital floor, unspecified side, initial encounter for closed fracture: Secondary | ICD-10-CM | POA: Diagnosis not present

## 2020-02-25 DIAGNOSIS — F4001 Agoraphobia with panic disorder: Secondary | ICD-10-CM | POA: Diagnosis not present

## 2020-02-25 DIAGNOSIS — M19012 Primary osteoarthritis, left shoulder: Secondary | ICD-10-CM | POA: Diagnosis not present

## 2020-02-25 DIAGNOSIS — I5022 Chronic systolic (congestive) heart failure: Secondary | ICD-10-CM | POA: Diagnosis not present

## 2020-02-25 DIAGNOSIS — S99922A Unspecified injury of left foot, initial encounter: Secondary | ICD-10-CM | POA: Diagnosis not present

## 2020-02-25 DIAGNOSIS — G7000 Myasthenia gravis without (acute) exacerbation: Secondary | ICD-10-CM | POA: Diagnosis not present

## 2020-02-25 DIAGNOSIS — Z9861 Coronary angioplasty status: Secondary | ICD-10-CM | POA: Diagnosis not present

## 2020-02-25 DIAGNOSIS — Z792 Long term (current) use of antibiotics: Secondary | ICD-10-CM | POA: Diagnosis not present

## 2020-02-25 DIAGNOSIS — M19041 Primary osteoarthritis, right hand: Secondary | ICD-10-CM | POA: Diagnosis not present

## 2020-02-25 DIAGNOSIS — Z299 Encounter for prophylactic measures, unspecified: Secondary | ICD-10-CM | POA: Diagnosis not present

## 2020-02-25 DIAGNOSIS — H0288B Meibomian gland dysfunction left eye, upper and lower eyelids: Secondary | ICD-10-CM | POA: Diagnosis not present

## 2020-02-25 DIAGNOSIS — S82852S Displaced trimalleolar fracture of left lower leg, sequela: Secondary | ICD-10-CM | POA: Diagnosis not present

## 2020-02-25 DIAGNOSIS — I050 Rheumatic mitral stenosis: Secondary | ICD-10-CM | POA: Diagnosis not present

## 2020-02-25 DIAGNOSIS — H1013 Acute atopic conjunctivitis, bilateral: Secondary | ICD-10-CM | POA: Diagnosis not present

## 2020-02-25 DIAGNOSIS — M5137 Other intervertebral disc degeneration, lumbosacral region: Secondary | ICD-10-CM | POA: Diagnosis not present

## 2020-02-25 DIAGNOSIS — R399 Unspecified symptoms and signs involving the genitourinary system: Secondary | ICD-10-CM | POA: Diagnosis not present

## 2020-02-25 DIAGNOSIS — L409 Psoriasis, unspecified: Secondary | ICD-10-CM | POA: Diagnosis not present

## 2020-02-25 DIAGNOSIS — J4540 Moderate persistent asthma, uncomplicated: Secondary | ICD-10-CM | POA: Diagnosis not present

## 2020-02-25 DIAGNOSIS — J1282 Pneumonia due to coronavirus disease 2019: Secondary | ICD-10-CM | POA: Diagnosis not present

## 2020-02-25 DIAGNOSIS — K529 Noninfective gastroenteritis and colitis, unspecified: Secondary | ICD-10-CM | POA: Diagnosis not present

## 2020-02-25 DIAGNOSIS — G3184 Mild cognitive impairment, so stated: Secondary | ICD-10-CM | POA: Diagnosis not present

## 2020-02-25 DIAGNOSIS — M50223 Other cervical disc displacement at C6-C7 level: Secondary | ICD-10-CM | POA: Diagnosis not present

## 2020-02-25 DIAGNOSIS — S61451A Open bite of right hand, initial encounter: Secondary | ICD-10-CM | POA: Diagnosis not present

## 2020-02-25 DIAGNOSIS — M79675 Pain in left toe(s): Secondary | ICD-10-CM | POA: Diagnosis not present

## 2020-02-25 DIAGNOSIS — Z9989 Dependence on other enabling machines and devices: Secondary | ICD-10-CM | POA: Diagnosis not present

## 2020-02-25 DIAGNOSIS — I25118 Atherosclerotic heart disease of native coronary artery with other forms of angina pectoris: Secondary | ICD-10-CM | POA: Diagnosis not present

## 2020-02-25 DIAGNOSIS — J4541 Moderate persistent asthma with (acute) exacerbation: Secondary | ICD-10-CM | POA: Diagnosis not present

## 2020-02-25 DIAGNOSIS — Z85068 Personal history of other malignant neoplasm of small intestine: Secondary | ICD-10-CM | POA: Diagnosis not present

## 2020-02-25 DIAGNOSIS — K829 Disease of gallbladder, unspecified: Secondary | ICD-10-CM | POA: Diagnosis not present

## 2020-02-25 DIAGNOSIS — F439 Reaction to severe stress, unspecified: Secondary | ICD-10-CM | POA: Diagnosis not present

## 2020-02-25 DIAGNOSIS — Z87440 Personal history of urinary (tract) infections: Secondary | ICD-10-CM | POA: Diagnosis not present

## 2020-02-25 DIAGNOSIS — M1120 Other chondrocalcinosis, unspecified site: Secondary | ICD-10-CM | POA: Diagnosis not present

## 2020-02-25 DIAGNOSIS — M48062 Spinal stenosis, lumbar region with neurogenic claudication: Secondary | ICD-10-CM | POA: Diagnosis not present

## 2020-02-25 DIAGNOSIS — M2241 Chondromalacia patellae, right knee: Secondary | ICD-10-CM | POA: Diagnosis not present

## 2020-02-25 DIAGNOSIS — M25472 Effusion, left ankle: Secondary | ICD-10-CM | POA: Diagnosis not present

## 2020-02-25 DIAGNOSIS — R0609 Other forms of dyspnea: Secondary | ICD-10-CM | POA: Diagnosis not present

## 2020-02-25 DIAGNOSIS — R0989 Other specified symptoms and signs involving the circulatory and respiratory systems: Secondary | ICD-10-CM | POA: Diagnosis not present

## 2020-02-25 DIAGNOSIS — B349 Viral infection, unspecified: Secondary | ICD-10-CM | POA: Diagnosis not present

## 2020-02-25 DIAGNOSIS — M719 Bursopathy, unspecified: Secondary | ICD-10-CM | POA: Diagnosis not present

## 2020-02-25 DIAGNOSIS — G40909 Epilepsy, unspecified, not intractable, without status epilepticus: Secondary | ICD-10-CM | POA: Diagnosis not present

## 2020-02-25 DIAGNOSIS — I441 Atrioventricular block, second degree: Secondary | ICD-10-CM | POA: Diagnosis not present

## 2020-02-25 DIAGNOSIS — Z794 Long term (current) use of insulin: Secondary | ICD-10-CM | POA: Diagnosis not present

## 2020-02-25 DIAGNOSIS — R011 Cardiac murmur, unspecified: Secondary | ICD-10-CM | POA: Diagnosis not present

## 2020-02-25 DIAGNOSIS — S32592A Other specified fracture of left pubis, initial encounter for closed fracture: Secondary | ICD-10-CM | POA: Diagnosis not present

## 2020-02-25 DIAGNOSIS — M1612 Unilateral primary osteoarthritis, left hip: Secondary | ICD-10-CM | POA: Diagnosis not present

## 2020-02-25 DIAGNOSIS — I499 Cardiac arrhythmia, unspecified: Secondary | ICD-10-CM | POA: Diagnosis not present

## 2020-02-25 DIAGNOSIS — C775 Secondary and unspecified malignant neoplasm of intrapelvic lymph nodes: Secondary | ICD-10-CM | POA: Diagnosis not present

## 2020-02-25 DIAGNOSIS — N959 Unspecified menopausal and perimenopausal disorder: Secondary | ICD-10-CM | POA: Diagnosis not present

## 2020-02-25 DIAGNOSIS — N6311 Unspecified lump in the right breast, upper outer quadrant: Secondary | ICD-10-CM | POA: Diagnosis not present

## 2020-02-25 DIAGNOSIS — H6980 Other specified disorders of Eustachian tube, unspecified ear: Secondary | ICD-10-CM | POA: Diagnosis not present

## 2020-02-25 DIAGNOSIS — M7022 Olecranon bursitis, left elbow: Secondary | ICD-10-CM | POA: Diagnosis not present

## 2020-02-25 DIAGNOSIS — H34831 Tributary (branch) retinal vein occlusion, right eye, with macular edema: Secondary | ICD-10-CM | POA: Diagnosis not present

## 2020-02-25 DIAGNOSIS — S30860D Insect bite (nonvenomous) of lower back and pelvis, subsequent encounter: Secondary | ICD-10-CM | POA: Diagnosis not present

## 2020-02-25 DIAGNOSIS — Z923 Personal history of irradiation: Secondary | ICD-10-CM | POA: Diagnosis not present

## 2020-02-25 DIAGNOSIS — D0461 Carcinoma in situ of skin of right upper limb, including shoulder: Secondary | ICD-10-CM | POA: Diagnosis not present

## 2020-02-25 DIAGNOSIS — J9621 Acute and chronic respiratory failure with hypoxia: Secondary | ICD-10-CM | POA: Diagnosis not present

## 2020-02-25 DIAGNOSIS — L720 Epidermal cyst: Secondary | ICD-10-CM | POA: Diagnosis not present

## 2020-02-25 DIAGNOSIS — L814 Other melanin hyperpigmentation: Secondary | ICD-10-CM | POA: Diagnosis not present

## 2020-02-25 DIAGNOSIS — I270 Primary pulmonary hypertension: Secondary | ICD-10-CM | POA: Diagnosis not present

## 2020-02-25 DIAGNOSIS — I2510 Atherosclerotic heart disease of native coronary artery without angina pectoris: Secondary | ICD-10-CM | POA: Diagnosis not present

## 2020-02-25 DIAGNOSIS — G250 Essential tremor: Secondary | ICD-10-CM | POA: Diagnosis not present

## 2020-02-25 DIAGNOSIS — Z79890 Hormone replacement therapy (postmenopausal): Secondary | ICD-10-CM | POA: Diagnosis not present

## 2020-02-25 DIAGNOSIS — S46011D Strain of muscle(s) and tendon(s) of the rotator cuff of right shoulder, subsequent encounter: Secondary | ICD-10-CM | POA: Diagnosis not present

## 2020-02-25 DIAGNOSIS — R072 Precordial pain: Secondary | ICD-10-CM | POA: Diagnosis not present

## 2020-02-25 DIAGNOSIS — K2100 Gastro-esophageal reflux disease with esophagitis, without bleeding: Secondary | ICD-10-CM | POA: Diagnosis not present

## 2020-02-25 DIAGNOSIS — J302 Other seasonal allergic rhinitis: Secondary | ICD-10-CM | POA: Diagnosis not present

## 2020-02-25 DIAGNOSIS — D649 Anemia, unspecified: Secondary | ICD-10-CM | POA: Diagnosis not present

## 2020-02-25 DIAGNOSIS — C44622 Squamous cell carcinoma of skin of right upper limb, including shoulder: Secondary | ICD-10-CM | POA: Diagnosis not present

## 2020-02-25 DIAGNOSIS — I0981 Rheumatic heart failure: Secondary | ICD-10-CM | POA: Diagnosis not present

## 2020-02-25 DIAGNOSIS — M19049 Primary osteoarthritis, unspecified hand: Secondary | ICD-10-CM | POA: Diagnosis not present

## 2020-02-25 DIAGNOSIS — Z6824 Body mass index (BMI) 24.0-24.9, adult: Secondary | ICD-10-CM | POA: Diagnosis not present

## 2020-02-25 DIAGNOSIS — H6123 Impacted cerumen, bilateral: Secondary | ICD-10-CM | POA: Diagnosis not present

## 2020-02-25 DIAGNOSIS — K3189 Other diseases of stomach and duodenum: Secondary | ICD-10-CM | POA: Diagnosis not present

## 2020-02-25 DIAGNOSIS — Z9181 History of falling: Secondary | ICD-10-CM | POA: Diagnosis not present

## 2020-02-25 DIAGNOSIS — R112 Nausea with vomiting, unspecified: Secondary | ICD-10-CM | POA: Diagnosis not present

## 2020-02-25 DIAGNOSIS — R269 Unspecified abnormalities of gait and mobility: Secondary | ICD-10-CM | POA: Diagnosis not present

## 2020-02-25 DIAGNOSIS — Z7689 Persons encountering health services in other specified circumstances: Secondary | ICD-10-CM | POA: Diagnosis not present

## 2020-02-25 DIAGNOSIS — I359 Nonrheumatic aortic valve disorder, unspecified: Secondary | ICD-10-CM | POA: Diagnosis not present

## 2020-02-25 DIAGNOSIS — G8220 Paraplegia, unspecified: Secondary | ICD-10-CM | POA: Diagnosis not present

## 2020-02-25 DIAGNOSIS — M86671 Other chronic osteomyelitis, right ankle and foot: Secondary | ICD-10-CM | POA: Diagnosis not present

## 2020-02-25 DIAGNOSIS — S62525D Nondisplaced fracture of distal phalanx of left thumb, subsequent encounter for fracture with routine healing: Secondary | ICD-10-CM | POA: Diagnosis not present

## 2020-02-25 DIAGNOSIS — M25539 Pain in unspecified wrist: Secondary | ICD-10-CM | POA: Diagnosis not present

## 2020-02-25 DIAGNOSIS — E209 Hypoparathyroidism, unspecified: Secondary | ICD-10-CM | POA: Diagnosis not present

## 2020-02-25 DIAGNOSIS — Z8711 Personal history of peptic ulcer disease: Secondary | ICD-10-CM | POA: Diagnosis not present

## 2020-02-25 DIAGNOSIS — I5031 Acute diastolic (congestive) heart failure: Secondary | ICD-10-CM | POA: Diagnosis not present

## 2020-02-25 DIAGNOSIS — R635 Abnormal weight gain: Secondary | ICD-10-CM | POA: Diagnosis not present

## 2020-02-25 DIAGNOSIS — K5902 Outlet dysfunction constipation: Secondary | ICD-10-CM | POA: Diagnosis not present

## 2020-02-25 DIAGNOSIS — R293 Abnormal posture: Secondary | ICD-10-CM | POA: Diagnosis not present

## 2020-02-25 DIAGNOSIS — Z981 Arthrodesis status: Secondary | ICD-10-CM | POA: Diagnosis not present

## 2020-02-25 DIAGNOSIS — G4710 Hypersomnia, unspecified: Secondary | ICD-10-CM | POA: Diagnosis not present

## 2020-02-25 DIAGNOSIS — H18413 Arcus senilis, bilateral: Secondary | ICD-10-CM | POA: Diagnosis not present

## 2020-02-25 DIAGNOSIS — S81801D Unspecified open wound, right lower leg, subsequent encounter: Secondary | ICD-10-CM | POA: Diagnosis not present

## 2020-02-25 DIAGNOSIS — S42002K Fracture of unspecified part of left clavicle, subsequent encounter for fracture with nonunion: Secondary | ICD-10-CM | POA: Diagnosis not present

## 2020-02-25 DIAGNOSIS — C562 Malignant neoplasm of left ovary: Secondary | ICD-10-CM | POA: Diagnosis not present

## 2020-02-25 DIAGNOSIS — R3914 Feeling of incomplete bladder emptying: Secondary | ICD-10-CM | POA: Diagnosis not present

## 2020-02-25 DIAGNOSIS — D638 Anemia in other chronic diseases classified elsewhere: Secondary | ICD-10-CM | POA: Diagnosis not present

## 2020-02-25 DIAGNOSIS — Z125 Encounter for screening for malignant neoplasm of prostate: Secondary | ICD-10-CM | POA: Diagnosis not present

## 2020-02-25 DIAGNOSIS — Z510 Encounter for antineoplastic radiation therapy: Secondary | ICD-10-CM | POA: Diagnosis not present

## 2020-02-25 DIAGNOSIS — R609 Edema, unspecified: Secondary | ICD-10-CM | POA: Diagnosis not present

## 2020-02-25 DIAGNOSIS — I959 Hypotension, unspecified: Secondary | ICD-10-CM | POA: Diagnosis not present

## 2020-02-25 DIAGNOSIS — R0689 Other abnormalities of breathing: Secondary | ICD-10-CM | POA: Diagnosis not present

## 2020-02-25 DIAGNOSIS — M109 Gout, unspecified: Secondary | ICD-10-CM | POA: Diagnosis not present

## 2020-02-25 DIAGNOSIS — Z91011 Allergy to milk products: Secondary | ICD-10-CM | POA: Diagnosis not present

## 2020-02-25 DIAGNOSIS — Z944 Liver transplant status: Secondary | ICD-10-CM | POA: Diagnosis not present

## 2020-02-25 DIAGNOSIS — L300 Nummular dermatitis: Secondary | ICD-10-CM | POA: Diagnosis not present

## 2020-02-25 DIAGNOSIS — D62 Acute posthemorrhagic anemia: Secondary | ICD-10-CM | POA: Diagnosis not present

## 2020-02-25 DIAGNOSIS — M25612 Stiffness of left shoulder, not elsewhere classified: Secondary | ICD-10-CM | POA: Diagnosis not present

## 2020-02-25 DIAGNOSIS — M48061 Spinal stenosis, lumbar region without neurogenic claudication: Secondary | ICD-10-CM | POA: Diagnosis not present

## 2020-02-25 DIAGNOSIS — D494 Neoplasm of unspecified behavior of bladder: Secondary | ICD-10-CM | POA: Diagnosis not present

## 2020-02-25 DIAGNOSIS — G301 Alzheimer's disease with late onset: Secondary | ICD-10-CM | POA: Diagnosis not present

## 2020-02-25 DIAGNOSIS — M40204 Unspecified kyphosis, thoracic region: Secondary | ICD-10-CM | POA: Diagnosis not present

## 2020-02-25 DIAGNOSIS — N281 Cyst of kidney, acquired: Secondary | ICD-10-CM | POA: Diagnosis not present

## 2020-02-25 DIAGNOSIS — Z9889 Other specified postprocedural states: Secondary | ICD-10-CM | POA: Diagnosis not present

## 2020-02-25 DIAGNOSIS — M2042 Other hammer toe(s) (acquired), left foot: Secondary | ICD-10-CM | POA: Diagnosis not present

## 2020-02-25 DIAGNOSIS — H401133 Primary open-angle glaucoma, bilateral, severe stage: Secondary | ICD-10-CM | POA: Diagnosis not present

## 2020-02-25 DIAGNOSIS — Z8679 Personal history of other diseases of the circulatory system: Secondary | ICD-10-CM | POA: Diagnosis not present

## 2020-02-25 DIAGNOSIS — R32 Unspecified urinary incontinence: Secondary | ICD-10-CM | POA: Diagnosis not present

## 2020-02-25 DIAGNOSIS — K7589 Other specified inflammatory liver diseases: Secondary | ICD-10-CM | POA: Diagnosis not present

## 2020-02-25 DIAGNOSIS — M79604 Pain in right leg: Secondary | ICD-10-CM | POA: Diagnosis not present

## 2020-02-25 DIAGNOSIS — E872 Acidosis: Secondary | ICD-10-CM | POA: Diagnosis not present

## 2020-02-25 DIAGNOSIS — M5432 Sciatica, left side: Secondary | ICD-10-CM | POA: Diagnosis not present

## 2020-02-25 DIAGNOSIS — M064 Inflammatory polyarthropathy: Secondary | ICD-10-CM | POA: Diagnosis not present

## 2020-02-25 DIAGNOSIS — Z23 Encounter for immunization: Secondary | ICD-10-CM | POA: Diagnosis not present

## 2020-02-25 DIAGNOSIS — L600 Ingrowing nail: Secondary | ICD-10-CM | POA: Diagnosis not present

## 2020-02-25 DIAGNOSIS — H2513 Age-related nuclear cataract, bilateral: Secondary | ICD-10-CM | POA: Diagnosis not present

## 2020-02-25 DIAGNOSIS — Z6826 Body mass index (BMI) 26.0-26.9, adult: Secondary | ICD-10-CM | POA: Diagnosis not present

## 2020-02-25 DIAGNOSIS — F411 Generalized anxiety disorder: Secondary | ICD-10-CM | POA: Diagnosis not present

## 2020-02-25 DIAGNOSIS — Z789 Other specified health status: Secondary | ICD-10-CM | POA: Diagnosis not present

## 2020-02-25 DIAGNOSIS — M8000XD Age-related osteoporosis with current pathological fracture, unspecified site, subsequent encounter for fracture with routine healing: Secondary | ICD-10-CM | POA: Diagnosis not present

## 2020-02-25 DIAGNOSIS — M79602 Pain in left arm: Secondary | ICD-10-CM | POA: Diagnosis not present

## 2020-02-25 DIAGNOSIS — H6063 Unspecified chronic otitis externa, bilateral: Secondary | ICD-10-CM | POA: Diagnosis not present

## 2020-02-25 DIAGNOSIS — C672 Malignant neoplasm of lateral wall of bladder: Secondary | ICD-10-CM | POA: Diagnosis not present

## 2020-02-25 DIAGNOSIS — E782 Mixed hyperlipidemia: Secondary | ICD-10-CM | POA: Diagnosis not present

## 2020-02-25 DIAGNOSIS — E1122 Type 2 diabetes mellitus with diabetic chronic kidney disease: Secondary | ICD-10-CM | POA: Diagnosis not present

## 2020-02-25 DIAGNOSIS — N289 Disorder of kidney and ureter, unspecified: Secondary | ICD-10-CM | POA: Diagnosis not present

## 2020-02-25 DIAGNOSIS — B001 Herpesviral vesicular dermatitis: Secondary | ICD-10-CM | POA: Diagnosis not present

## 2020-02-25 DIAGNOSIS — M2550 Pain in unspecified joint: Secondary | ICD-10-CM | POA: Diagnosis not present

## 2020-02-25 DIAGNOSIS — R1012 Left upper quadrant pain: Secondary | ICD-10-CM | POA: Diagnosis not present

## 2020-02-25 DIAGNOSIS — C50412 Malignant neoplasm of upper-outer quadrant of left female breast: Secondary | ICD-10-CM | POA: Diagnosis not present

## 2020-02-25 DIAGNOSIS — R8290 Unspecified abnormal findings in urine: Secondary | ICD-10-CM | POA: Diagnosis not present

## 2020-02-25 DIAGNOSIS — Z1389 Encounter for screening for other disorder: Secondary | ICD-10-CM | POA: Diagnosis not present

## 2020-02-25 DIAGNOSIS — I071 Rheumatic tricuspid insufficiency: Secondary | ICD-10-CM | POA: Diagnosis not present

## 2020-02-25 DIAGNOSIS — H40013 Open angle with borderline findings, low risk, bilateral: Secondary | ICD-10-CM | POA: Diagnosis not present

## 2020-02-25 DIAGNOSIS — Z8701 Personal history of pneumonia (recurrent): Secondary | ICD-10-CM | POA: Diagnosis not present

## 2020-02-25 DIAGNOSIS — R451 Restlessness and agitation: Secondary | ICD-10-CM | POA: Diagnosis not present

## 2020-02-25 DIAGNOSIS — B955 Unspecified streptococcus as the cause of diseases classified elsewhere: Secondary | ICD-10-CM | POA: Diagnosis not present

## 2020-02-25 DIAGNOSIS — J8410 Pulmonary fibrosis, unspecified: Secondary | ICD-10-CM | POA: Diagnosis not present

## 2020-02-25 DIAGNOSIS — D473 Essential (hemorrhagic) thrombocythemia: Secondary | ICD-10-CM | POA: Diagnosis not present

## 2020-02-25 DIAGNOSIS — Z6837 Body mass index (BMI) 37.0-37.9, adult: Secondary | ICD-10-CM | POA: Diagnosis not present

## 2020-02-25 DIAGNOSIS — R404 Transient alteration of awareness: Secondary | ICD-10-CM | POA: Diagnosis not present

## 2020-02-25 DIAGNOSIS — C155 Malignant neoplasm of lower third of esophagus: Secondary | ICD-10-CM | POA: Diagnosis not present

## 2020-02-25 DIAGNOSIS — D510 Vitamin B12 deficiency anemia due to intrinsic factor deficiency: Secondary | ICD-10-CM | POA: Diagnosis not present

## 2020-02-25 DIAGNOSIS — Z79891 Long term (current) use of opiate analgesic: Secondary | ICD-10-CM | POA: Diagnosis not present

## 2020-02-25 DIAGNOSIS — M19011 Primary osteoarthritis, right shoulder: Secondary | ICD-10-CM | POA: Diagnosis not present

## 2020-02-25 DIAGNOSIS — M25662 Stiffness of left knee, not elsewhere classified: Secondary | ICD-10-CM | POA: Diagnosis not present

## 2020-02-25 DIAGNOSIS — J3489 Other specified disorders of nose and nasal sinuses: Secondary | ICD-10-CM | POA: Diagnosis not present

## 2020-02-25 DIAGNOSIS — I442 Atrioventricular block, complete: Secondary | ICD-10-CM | POA: Diagnosis not present

## 2020-02-25 DIAGNOSIS — M25579 Pain in unspecified ankle and joints of unspecified foot: Secondary | ICD-10-CM | POA: Diagnosis not present

## 2020-02-25 DIAGNOSIS — H34219 Partial retinal artery occlusion, unspecified eye: Secondary | ICD-10-CM | POA: Diagnosis not present

## 2020-02-25 DIAGNOSIS — K589 Irritable bowel syndrome without diarrhea: Secondary | ICD-10-CM | POA: Diagnosis not present

## 2020-02-25 DIAGNOSIS — M25611 Stiffness of right shoulder, not elsewhere classified: Secondary | ICD-10-CM | POA: Diagnosis not present

## 2020-02-25 DIAGNOSIS — M7522 Bicipital tendinitis, left shoulder: Secondary | ICD-10-CM | POA: Diagnosis not present

## 2020-02-25 DIAGNOSIS — D2272 Melanocytic nevi of left lower limb, including hip: Secondary | ICD-10-CM | POA: Diagnosis not present

## 2020-02-25 DIAGNOSIS — I420 Dilated cardiomyopathy: Secondary | ICD-10-CM | POA: Diagnosis not present

## 2020-02-25 DIAGNOSIS — J189 Pneumonia, unspecified organism: Secondary | ICD-10-CM | POA: Diagnosis not present

## 2020-02-25 DIAGNOSIS — Z1159 Encounter for screening for other viral diseases: Secondary | ICD-10-CM | POA: Diagnosis not present

## 2020-02-25 DIAGNOSIS — E0500 Thyrotoxicosis with diffuse goiter without thyrotoxic crisis or storm: Secondary | ICD-10-CM | POA: Diagnosis not present

## 2020-02-25 DIAGNOSIS — H35443 Age-related reticular degeneration of retina, bilateral: Secondary | ICD-10-CM | POA: Diagnosis not present

## 2020-02-25 DIAGNOSIS — R1011 Right upper quadrant pain: Secondary | ICD-10-CM | POA: Diagnosis not present

## 2020-02-25 DIAGNOSIS — G893 Neoplasm related pain (acute) (chronic): Secondary | ICD-10-CM | POA: Diagnosis not present

## 2020-02-25 DIAGNOSIS — H401131 Primary open-angle glaucoma, bilateral, mild stage: Secondary | ICD-10-CM | POA: Diagnosis not present

## 2020-02-25 DIAGNOSIS — Z9221 Personal history of antineoplastic chemotherapy: Secondary | ICD-10-CM | POA: Diagnosis not present

## 2020-02-25 DIAGNOSIS — M79605 Pain in left leg: Secondary | ICD-10-CM | POA: Diagnosis not present

## 2020-02-25 DIAGNOSIS — Z8739 Personal history of other diseases of the musculoskeletal system and connective tissue: Secondary | ICD-10-CM | POA: Diagnosis not present

## 2020-02-25 DIAGNOSIS — I5040 Unspecified combined systolic (congestive) and diastolic (congestive) heart failure: Secondary | ICD-10-CM | POA: Diagnosis not present

## 2020-02-25 DIAGNOSIS — M19071 Primary osteoarthritis, right ankle and foot: Secondary | ICD-10-CM | POA: Diagnosis not present

## 2020-02-25 DIAGNOSIS — I471 Supraventricular tachycardia: Secondary | ICD-10-CM | POA: Diagnosis not present

## 2020-02-25 DIAGNOSIS — D122 Benign neoplasm of ascending colon: Secondary | ICD-10-CM | POA: Diagnosis not present

## 2020-02-25 DIAGNOSIS — Z13220 Encounter for screening for lipoid disorders: Secondary | ICD-10-CM | POA: Diagnosis not present

## 2020-02-25 DIAGNOSIS — C50111 Malignant neoplasm of central portion of right female breast: Secondary | ICD-10-CM | POA: Diagnosis not present

## 2020-02-25 DIAGNOSIS — M75122 Complete rotator cuff tear or rupture of left shoulder, not specified as traumatic: Secondary | ICD-10-CM | POA: Diagnosis not present

## 2020-02-25 DIAGNOSIS — G8250 Quadriplegia, unspecified: Secondary | ICD-10-CM | POA: Diagnosis not present

## 2020-02-25 DIAGNOSIS — Z7189 Other specified counseling: Secondary | ICD-10-CM | POA: Diagnosis not present

## 2020-02-25 DIAGNOSIS — E113513 Type 2 diabetes mellitus with proliferative diabetic retinopathy with macular edema, bilateral: Secondary | ICD-10-CM | POA: Diagnosis not present

## 2020-02-25 DIAGNOSIS — I080 Rheumatic disorders of both mitral and aortic valves: Secondary | ICD-10-CM | POA: Diagnosis not present

## 2020-02-25 DIAGNOSIS — Z1331 Encounter for screening for depression: Secondary | ICD-10-CM | POA: Diagnosis not present

## 2020-02-25 DIAGNOSIS — S41112D Laceration without foreign body of left upper arm, subsequent encounter: Secondary | ICD-10-CM | POA: Diagnosis not present

## 2020-02-25 DIAGNOSIS — J9601 Acute respiratory failure with hypoxia: Secondary | ICD-10-CM | POA: Diagnosis not present

## 2020-02-25 DIAGNOSIS — R110 Nausea: Secondary | ICD-10-CM | POA: Diagnosis not present

## 2020-02-25 DIAGNOSIS — G620 Drug-induced polyneuropathy: Secondary | ICD-10-CM | POA: Diagnosis not present

## 2020-02-25 DIAGNOSIS — I25119 Atherosclerotic heart disease of native coronary artery with unspecified angina pectoris: Secondary | ICD-10-CM | POA: Diagnosis not present

## 2020-02-25 DIAGNOSIS — E441 Mild protein-calorie malnutrition: Secondary | ICD-10-CM | POA: Diagnosis not present

## 2020-02-25 DIAGNOSIS — C44529 Squamous cell carcinoma of skin of other part of trunk: Secondary | ICD-10-CM | POA: Diagnosis not present

## 2020-02-25 DIAGNOSIS — Z974 Presence of external hearing-aid: Secondary | ICD-10-CM | POA: Diagnosis not present

## 2020-02-25 DIAGNOSIS — F1120 Opioid dependence, uncomplicated: Secondary | ICD-10-CM | POA: Diagnosis not present

## 2020-02-25 DIAGNOSIS — Z131 Encounter for screening for diabetes mellitus: Secondary | ICD-10-CM | POA: Diagnosis not present

## 2020-02-25 DIAGNOSIS — M79671 Pain in right foot: Secondary | ICD-10-CM | POA: Diagnosis not present

## 2020-02-25 DIAGNOSIS — R300 Dysuria: Secondary | ICD-10-CM | POA: Diagnosis not present

## 2020-02-25 DIAGNOSIS — D631 Anemia in chronic kidney disease: Secondary | ICD-10-CM | POA: Diagnosis not present

## 2020-02-25 DIAGNOSIS — S32010G Wedge compression fracture of first lumbar vertebra, subsequent encounter for fracture with delayed healing: Secondary | ICD-10-CM | POA: Diagnosis not present

## 2020-02-25 DIAGNOSIS — M19072 Primary osteoarthritis, left ankle and foot: Secondary | ICD-10-CM | POA: Diagnosis not present

## 2020-02-25 DIAGNOSIS — H9201 Otalgia, right ear: Secondary | ICD-10-CM | POA: Diagnosis not present

## 2020-02-25 DIAGNOSIS — R202 Paresthesia of skin: Secondary | ICD-10-CM | POA: Diagnosis not present

## 2020-02-25 DIAGNOSIS — Z8619 Personal history of other infectious and parasitic diseases: Secondary | ICD-10-CM | POA: Diagnosis not present

## 2020-02-25 DIAGNOSIS — L97529 Non-pressure chronic ulcer of other part of left foot with unspecified severity: Secondary | ICD-10-CM | POA: Diagnosis not present

## 2020-02-25 DIAGNOSIS — Z886 Allergy status to analgesic agent status: Secondary | ICD-10-CM | POA: Diagnosis not present

## 2020-02-25 DIAGNOSIS — F4323 Adjustment disorder with mixed anxiety and depressed mood: Secondary | ICD-10-CM | POA: Diagnosis not present

## 2020-02-25 DIAGNOSIS — M539 Dorsopathy, unspecified: Secondary | ICD-10-CM | POA: Diagnosis not present

## 2020-02-25 DIAGNOSIS — M25551 Pain in right hip: Secondary | ICD-10-CM | POA: Diagnosis not present

## 2020-02-25 DIAGNOSIS — N319 Neuromuscular dysfunction of bladder, unspecified: Secondary | ICD-10-CM | POA: Diagnosis not present

## 2020-02-25 DIAGNOSIS — N5201 Erectile dysfunction due to arterial insufficiency: Secondary | ICD-10-CM | POA: Diagnosis not present

## 2020-02-25 DIAGNOSIS — N3289 Other specified disorders of bladder: Secondary | ICD-10-CM | POA: Diagnosis not present

## 2020-02-25 DIAGNOSIS — E663 Overweight: Secondary | ICD-10-CM | POA: Diagnosis not present

## 2020-02-25 DIAGNOSIS — H65192 Other acute nonsuppurative otitis media, left ear: Secondary | ICD-10-CM | POA: Diagnosis not present

## 2020-02-25 DIAGNOSIS — M1990 Unspecified osteoarthritis, unspecified site: Secondary | ICD-10-CM | POA: Diagnosis not present

## 2020-02-25 DIAGNOSIS — Z7951 Long term (current) use of inhaled steroids: Secondary | ICD-10-CM | POA: Diagnosis not present

## 2020-02-25 DIAGNOSIS — M5030 Other cervical disc degeneration, unspecified cervical region: Secondary | ICD-10-CM | POA: Diagnosis not present

## 2020-02-25 DIAGNOSIS — M25532 Pain in left wrist: Secondary | ICD-10-CM | POA: Diagnosis not present

## 2020-02-25 DIAGNOSIS — R7302 Impaired glucose tolerance (oral): Secondary | ICD-10-CM | POA: Diagnosis not present

## 2020-02-25 DIAGNOSIS — M79644 Pain in right finger(s): Secondary | ICD-10-CM | POA: Diagnosis not present

## 2020-02-25 DIAGNOSIS — M47896 Other spondylosis, lumbar region: Secondary | ICD-10-CM | POA: Diagnosis not present

## 2020-02-25 DIAGNOSIS — T7800XD Anaphylactic reaction due to unspecified food, subsequent encounter: Secondary | ICD-10-CM | POA: Diagnosis not present

## 2020-02-25 DIAGNOSIS — Z09 Encounter for follow-up examination after completed treatment for conditions other than malignant neoplasm: Secondary | ICD-10-CM | POA: Diagnosis not present

## 2020-02-25 DIAGNOSIS — L97522 Non-pressure chronic ulcer of other part of left foot with fat layer exposed: Secondary | ICD-10-CM | POA: Diagnosis not present

## 2020-02-25 DIAGNOSIS — H26493 Other secondary cataract, bilateral: Secondary | ICD-10-CM | POA: Diagnosis not present

## 2020-02-25 DIAGNOSIS — D125 Benign neoplasm of sigmoid colon: Secondary | ICD-10-CM | POA: Diagnosis not present

## 2020-02-25 DIAGNOSIS — K645 Perianal venous thrombosis: Secondary | ICD-10-CM | POA: Diagnosis not present

## 2020-02-25 DIAGNOSIS — Z9012 Acquired absence of left breast and nipple: Secondary | ICD-10-CM | POA: Diagnosis not present

## 2020-02-25 DIAGNOSIS — Z993 Dependence on wheelchair: Secondary | ICD-10-CM | POA: Diagnosis not present

## 2020-02-25 DIAGNOSIS — R7989 Other specified abnormal findings of blood chemistry: Secondary | ICD-10-CM | POA: Diagnosis not present

## 2020-02-25 DIAGNOSIS — M5442 Lumbago with sciatica, left side: Secondary | ICD-10-CM | POA: Diagnosis not present

## 2020-02-25 DIAGNOSIS — Z8546 Personal history of malignant neoplasm of prostate: Secondary | ICD-10-CM | POA: Diagnosis not present

## 2020-02-25 DIAGNOSIS — Z7401 Bed confinement status: Secondary | ICD-10-CM | POA: Diagnosis not present

## 2020-02-25 DIAGNOSIS — D294 Benign neoplasm of scrotum: Secondary | ICD-10-CM | POA: Diagnosis not present

## 2020-02-25 DIAGNOSIS — M205X9 Other deformities of toe(s) (acquired), unspecified foot: Secondary | ICD-10-CM | POA: Diagnosis not present

## 2020-02-25 DIAGNOSIS — L97221 Non-pressure chronic ulcer of left calf limited to breakdown of skin: Secondary | ICD-10-CM | POA: Diagnosis not present

## 2020-02-25 DIAGNOSIS — E161 Other hypoglycemia: Secondary | ICD-10-CM | POA: Diagnosis not present

## 2020-02-25 DIAGNOSIS — D2239 Melanocytic nevi of other parts of face: Secondary | ICD-10-CM | POA: Diagnosis not present

## 2020-02-25 DIAGNOSIS — R0600 Dyspnea, unspecified: Secondary | ICD-10-CM | POA: Diagnosis not present

## 2020-02-25 DIAGNOSIS — Z800 Family history of malignant neoplasm of digestive organs: Secondary | ICD-10-CM | POA: Diagnosis not present

## 2020-02-25 DIAGNOSIS — Z01812 Encounter for preprocedural laboratory examination: Secondary | ICD-10-CM | POA: Diagnosis not present

## 2020-02-25 DIAGNOSIS — F5101 Primary insomnia: Secondary | ICD-10-CM | POA: Diagnosis not present

## 2020-02-25 DIAGNOSIS — M216X1 Other acquired deformities of right foot: Secondary | ICD-10-CM | POA: Diagnosis not present

## 2020-02-25 DIAGNOSIS — N528 Other male erectile dysfunction: Secondary | ICD-10-CM | POA: Diagnosis not present

## 2020-02-25 DIAGNOSIS — M216X2 Other acquired deformities of left foot: Secondary | ICD-10-CM | POA: Diagnosis not present

## 2020-02-25 DIAGNOSIS — Z9049 Acquired absence of other specified parts of digestive tract: Secondary | ICD-10-CM | POA: Diagnosis not present

## 2020-02-25 DIAGNOSIS — F05 Delirium due to known physiological condition: Secondary | ICD-10-CM | POA: Diagnosis not present

## 2020-02-25 DIAGNOSIS — L97319 Non-pressure chronic ulcer of right ankle with unspecified severity: Secondary | ICD-10-CM | POA: Diagnosis not present

## 2020-02-25 DIAGNOSIS — E538 Deficiency of other specified B group vitamins: Secondary | ICD-10-CM | POA: Diagnosis not present

## 2020-02-25 DIAGNOSIS — I69354 Hemiplegia and hemiparesis following cerebral infarction affecting left non-dominant side: Secondary | ICD-10-CM | POA: Diagnosis not present

## 2020-02-25 DIAGNOSIS — M4626 Osteomyelitis of vertebra, lumbar region: Secondary | ICD-10-CM | POA: Diagnosis not present

## 2020-02-25 DIAGNOSIS — I130 Hypertensive heart and chronic kidney disease with heart failure and stage 1 through stage 4 chronic kidney disease, or unspecified chronic kidney disease: Secondary | ICD-10-CM | POA: Diagnosis not present

## 2020-02-25 DIAGNOSIS — Z1212 Encounter for screening for malignant neoplasm of rectum: Secondary | ICD-10-CM | POA: Diagnosis not present

## 2020-02-25 DIAGNOSIS — M62838 Other muscle spasm: Secondary | ICD-10-CM | POA: Diagnosis not present

## 2020-02-25 DIAGNOSIS — M25511 Pain in right shoulder: Secondary | ICD-10-CM | POA: Diagnosis not present

## 2020-02-25 DIAGNOSIS — Z8673 Personal history of transient ischemic attack (TIA), and cerebral infarction without residual deficits: Secondary | ICD-10-CM | POA: Diagnosis not present

## 2020-02-25 DIAGNOSIS — S72114A Nondisplaced fracture of greater trochanter of right femur, initial encounter for closed fracture: Secondary | ICD-10-CM | POA: Diagnosis not present

## 2020-02-25 DIAGNOSIS — I69391 Dysphagia following cerebral infarction: Secondary | ICD-10-CM | POA: Diagnosis not present

## 2020-02-25 DIAGNOSIS — Z4682 Encounter for fitting and adjustment of non-vascular catheter: Secondary | ICD-10-CM | POA: Diagnosis not present

## 2020-02-25 DIAGNOSIS — C4321 Malignant melanoma of right ear and external auricular canal: Secondary | ICD-10-CM | POA: Diagnosis not present

## 2020-02-25 DIAGNOSIS — M7061 Trochanteric bursitis, right hip: Secondary | ICD-10-CM | POA: Diagnosis not present

## 2020-02-25 DIAGNOSIS — M5416 Radiculopathy, lumbar region: Secondary | ICD-10-CM | POA: Diagnosis not present

## 2020-02-25 DIAGNOSIS — Z96651 Presence of right artificial knee joint: Secondary | ICD-10-CM | POA: Diagnosis not present

## 2020-02-25 DIAGNOSIS — I639 Cerebral infarction, unspecified: Secondary | ICD-10-CM | POA: Diagnosis not present

## 2020-02-25 DIAGNOSIS — R2689 Other abnormalities of gait and mobility: Secondary | ICD-10-CM | POA: Diagnosis not present

## 2020-02-25 DIAGNOSIS — M25559 Pain in unspecified hip: Secondary | ICD-10-CM | POA: Diagnosis not present

## 2020-02-25 DIAGNOSIS — H35371 Puckering of macula, right eye: Secondary | ICD-10-CM | POA: Diagnosis not present

## 2020-02-25 DIAGNOSIS — W57XXXA Bitten or stung by nonvenomous insect and other nonvenomous arthropods, initial encounter: Secondary | ICD-10-CM | POA: Diagnosis not present

## 2020-02-25 DIAGNOSIS — R262 Difficulty in walking, not elsewhere classified: Secondary | ICD-10-CM | POA: Diagnosis not present

## 2020-02-25 DIAGNOSIS — M1711 Unilateral primary osteoarthritis, right knee: Secondary | ICD-10-CM | POA: Diagnosis not present

## 2020-02-25 DIAGNOSIS — I351 Nonrheumatic aortic (valve) insufficiency: Secondary | ICD-10-CM | POA: Diagnosis not present

## 2020-02-25 DIAGNOSIS — F3340 Major depressive disorder, recurrent, in remission, unspecified: Secondary | ICD-10-CM | POA: Diagnosis not present

## 2020-02-25 DIAGNOSIS — I421 Obstructive hypertrophic cardiomyopathy: Secondary | ICD-10-CM | POA: Diagnosis not present

## 2020-02-25 DIAGNOSIS — C3431 Malignant neoplasm of lower lobe, right bronchus or lung: Secondary | ICD-10-CM | POA: Diagnosis not present

## 2020-02-25 DIAGNOSIS — Z9884 Bariatric surgery status: Secondary | ICD-10-CM | POA: Diagnosis not present

## 2020-02-25 DIAGNOSIS — N651 Disproportion of reconstructed breast: Secondary | ICD-10-CM | POA: Diagnosis not present

## 2020-02-25 DIAGNOSIS — S46112A Strain of muscle, fascia and tendon of long head of biceps, left arm, initial encounter: Secondary | ICD-10-CM | POA: Diagnosis not present

## 2020-02-25 DIAGNOSIS — R2241 Localized swelling, mass and lump, right lower limb: Secondary | ICD-10-CM | POA: Diagnosis not present

## 2020-02-25 DIAGNOSIS — F509 Eating disorder, unspecified: Secondary | ICD-10-CM | POA: Diagnosis not present

## 2020-02-25 DIAGNOSIS — I2699 Other pulmonary embolism without acute cor pulmonale: Secondary | ICD-10-CM | POA: Diagnosis not present

## 2020-02-25 DIAGNOSIS — M159 Polyosteoarthritis, unspecified: Secondary | ICD-10-CM | POA: Diagnosis not present

## 2020-02-25 DIAGNOSIS — M791 Myalgia, unspecified site: Secondary | ICD-10-CM | POA: Diagnosis not present

## 2020-02-25 DIAGNOSIS — L03012 Cellulitis of left finger: Secondary | ICD-10-CM | POA: Diagnosis not present

## 2020-02-25 DIAGNOSIS — S86312A Strain of muscle(s) and tendon(s) of peroneal muscle group at lower leg level, left leg, initial encounter: Secondary | ICD-10-CM | POA: Diagnosis not present

## 2020-02-25 DIAGNOSIS — G379 Demyelinating disease of central nervous system, unspecified: Secondary | ICD-10-CM | POA: Diagnosis not present

## 2020-02-25 DIAGNOSIS — C679 Malignant neoplasm of bladder, unspecified: Secondary | ICD-10-CM | POA: Diagnosis not present

## 2020-02-25 DIAGNOSIS — N3281 Overactive bladder: Secondary | ICD-10-CM | POA: Diagnosis not present

## 2020-02-25 DIAGNOSIS — K228 Other specified diseases of esophagus: Secondary | ICD-10-CM | POA: Diagnosis not present

## 2020-02-25 DIAGNOSIS — Z9911 Dependence on respirator [ventilator] status: Secondary | ICD-10-CM | POA: Diagnosis not present

## 2020-02-26 DIAGNOSIS — Z01812 Encounter for preprocedural laboratory examination: Secondary | ICD-10-CM | POA: Diagnosis not present

## 2020-02-26 DIAGNOSIS — M25412 Effusion, left shoulder: Secondary | ICD-10-CM | POA: Diagnosis not present

## 2020-02-26 DIAGNOSIS — S3991XA Unspecified injury of abdomen, initial encounter: Secondary | ICD-10-CM | POA: Diagnosis not present

## 2020-02-26 DIAGNOSIS — Z794 Long term (current) use of insulin: Secondary | ICD-10-CM | POA: Diagnosis not present

## 2020-02-26 DIAGNOSIS — Z452 Encounter for adjustment and management of vascular access device: Secondary | ICD-10-CM | POA: Diagnosis not present

## 2020-02-26 DIAGNOSIS — S32020G Wedge compression fracture of second lumbar vertebra, subsequent encounter for fracture with delayed healing: Secondary | ICD-10-CM | POA: Diagnosis not present

## 2020-02-26 DIAGNOSIS — N401 Enlarged prostate with lower urinary tract symptoms: Secondary | ICD-10-CM | POA: Diagnosis not present

## 2020-02-26 DIAGNOSIS — G43109 Migraine with aura, not intractable, without status migrainosus: Secondary | ICD-10-CM | POA: Diagnosis not present

## 2020-02-26 DIAGNOSIS — Z85850 Personal history of malignant neoplasm of thyroid: Secondary | ICD-10-CM | POA: Diagnosis not present

## 2020-02-26 DIAGNOSIS — I4519 Other right bundle-branch block: Secondary | ICD-10-CM | POA: Diagnosis not present

## 2020-02-26 DIAGNOSIS — Z8701 Personal history of pneumonia (recurrent): Secondary | ICD-10-CM | POA: Diagnosis not present

## 2020-02-26 DIAGNOSIS — Z833 Family history of diabetes mellitus: Secondary | ICD-10-CM | POA: Diagnosis not present

## 2020-02-26 DIAGNOSIS — Z800 Family history of malignant neoplasm of digestive organs: Secondary | ICD-10-CM | POA: Diagnosis not present

## 2020-02-26 DIAGNOSIS — R231 Pallor: Secondary | ICD-10-CM | POA: Diagnosis not present

## 2020-02-26 DIAGNOSIS — U071 COVID-19: Secondary | ICD-10-CM | POA: Diagnosis not present

## 2020-02-26 DIAGNOSIS — Z7901 Long term (current) use of anticoagulants: Secondary | ICD-10-CM | POA: Diagnosis not present

## 2020-02-26 DIAGNOSIS — A499 Bacterial infection, unspecified: Secondary | ICD-10-CM | POA: Diagnosis not present

## 2020-02-26 DIAGNOSIS — T500X5A Adverse effect of mineralocorticoids and their antagonists, initial encounter: Secondary | ICD-10-CM | POA: Diagnosis not present

## 2020-02-26 DIAGNOSIS — I6521 Occlusion and stenosis of right carotid artery: Secondary | ICD-10-CM | POA: Diagnosis not present

## 2020-02-26 DIAGNOSIS — R579 Shock, unspecified: Secondary | ICD-10-CM | POA: Diagnosis not present

## 2020-02-26 DIAGNOSIS — E559 Vitamin D deficiency, unspecified: Secondary | ICD-10-CM | POA: Diagnosis not present

## 2020-02-26 DIAGNOSIS — M47896 Other spondylosis, lumbar region: Secondary | ICD-10-CM | POA: Diagnosis not present

## 2020-02-26 DIAGNOSIS — J189 Pneumonia, unspecified organism: Secondary | ICD-10-CM | POA: Diagnosis not present

## 2020-02-26 DIAGNOSIS — S40021A Contusion of right upper arm, initial encounter: Secondary | ICD-10-CM | POA: Diagnosis not present

## 2020-02-26 DIAGNOSIS — G1221 Amyotrophic lateral sclerosis: Secondary | ICD-10-CM | POA: Diagnosis not present

## 2020-02-26 DIAGNOSIS — J8410 Pulmonary fibrosis, unspecified: Secondary | ICD-10-CM | POA: Diagnosis not present

## 2020-02-26 DIAGNOSIS — Z880 Allergy status to penicillin: Secondary | ICD-10-CM | POA: Diagnosis not present

## 2020-02-26 DIAGNOSIS — M542 Cervicalgia: Secondary | ICD-10-CM | POA: Diagnosis not present

## 2020-02-26 DIAGNOSIS — R350 Frequency of micturition: Secondary | ICD-10-CM | POA: Diagnosis not present

## 2020-02-26 DIAGNOSIS — R531 Weakness: Secondary | ICD-10-CM | POA: Diagnosis not present

## 2020-02-26 DIAGNOSIS — Z7951 Long term (current) use of inhaled steroids: Secondary | ICD-10-CM | POA: Diagnosis not present

## 2020-02-26 DIAGNOSIS — J328 Other chronic sinusitis: Secondary | ICD-10-CM | POA: Diagnosis not present

## 2020-02-26 DIAGNOSIS — Z8673 Personal history of transient ischemic attack (TIA), and cerebral infarction without residual deficits: Secondary | ICD-10-CM | POA: Diagnosis not present

## 2020-02-26 DIAGNOSIS — M25511 Pain in right shoulder: Secondary | ICD-10-CM | POA: Diagnosis not present

## 2020-02-26 DIAGNOSIS — T7840XA Allergy, unspecified, initial encounter: Secondary | ICD-10-CM | POA: Diagnosis not present

## 2020-02-26 DIAGNOSIS — Z7984 Long term (current) use of oral hypoglycemic drugs: Secondary | ICD-10-CM | POA: Diagnosis not present

## 2020-02-26 DIAGNOSIS — R260 Ataxic gait: Secondary | ICD-10-CM | POA: Diagnosis not present

## 2020-02-26 DIAGNOSIS — Z825 Family history of asthma and other chronic lower respiratory diseases: Secondary | ICD-10-CM | POA: Diagnosis not present

## 2020-02-26 DIAGNOSIS — S0990XA Unspecified injury of head, initial encounter: Secondary | ICD-10-CM | POA: Diagnosis not present

## 2020-02-26 DIAGNOSIS — J984 Other disorders of lung: Secondary | ICD-10-CM | POA: Diagnosis not present

## 2020-02-26 DIAGNOSIS — J9622 Acute and chronic respiratory failure with hypercapnia: Secondary | ICD-10-CM | POA: Diagnosis not present

## 2020-02-26 DIAGNOSIS — I4891 Unspecified atrial fibrillation: Secondary | ICD-10-CM | POA: Diagnosis not present

## 2020-02-26 DIAGNOSIS — M2550 Pain in unspecified joint: Secondary | ICD-10-CM | POA: Diagnosis not present

## 2020-02-26 DIAGNOSIS — L570 Actinic keratosis: Secondary | ICD-10-CM | POA: Diagnosis not present

## 2020-02-26 DIAGNOSIS — K219 Gastro-esophageal reflux disease without esophagitis: Secondary | ICD-10-CM | POA: Diagnosis not present

## 2020-02-26 DIAGNOSIS — F419 Anxiety disorder, unspecified: Secondary | ICD-10-CM | POA: Diagnosis not present

## 2020-02-26 DIAGNOSIS — J449 Chronic obstructive pulmonary disease, unspecified: Secondary | ICD-10-CM | POA: Diagnosis not present

## 2020-02-26 DIAGNOSIS — D485 Neoplasm of uncertain behavior of skin: Secondary | ICD-10-CM | POA: Diagnosis not present

## 2020-02-26 DIAGNOSIS — B370 Candidal stomatitis: Secondary | ICD-10-CM | POA: Diagnosis not present

## 2020-02-26 DIAGNOSIS — C039 Malignant neoplasm of gum, unspecified: Secondary | ICD-10-CM | POA: Diagnosis not present

## 2020-02-26 DIAGNOSIS — G4733 Obstructive sleep apnea (adult) (pediatric): Secondary | ICD-10-CM | POA: Diagnosis not present

## 2020-02-26 DIAGNOSIS — Z8249 Family history of ischemic heart disease and other diseases of the circulatory system: Secondary | ICD-10-CM | POA: Diagnosis not present

## 2020-02-26 DIAGNOSIS — G629 Polyneuropathy, unspecified: Secondary | ICD-10-CM | POA: Diagnosis not present

## 2020-02-26 DIAGNOSIS — Z0000 Encounter for general adult medical examination without abnormal findings: Secondary | ICD-10-CM | POA: Diagnosis not present

## 2020-02-26 DIAGNOSIS — F0151 Vascular dementia with behavioral disturbance: Secondary | ICD-10-CM | POA: Diagnosis not present

## 2020-02-26 DIAGNOSIS — I499 Cardiac arrhythmia, unspecified: Secondary | ICD-10-CM | POA: Diagnosis not present

## 2020-02-26 DIAGNOSIS — I2723 Pulmonary hypertension due to lung diseases and hypoxia: Secondary | ICD-10-CM | POA: Diagnosis not present

## 2020-02-26 DIAGNOSIS — R0902 Hypoxemia: Secondary | ICD-10-CM | POA: Diagnosis not present

## 2020-02-26 DIAGNOSIS — Z807 Family history of other malignant neoplasms of lymphoid, hematopoietic and related tissues: Secondary | ICD-10-CM | POA: Diagnosis not present

## 2020-02-26 DIAGNOSIS — E860 Dehydration: Secondary | ICD-10-CM | POA: Diagnosis not present

## 2020-02-26 DIAGNOSIS — F17290 Nicotine dependence, other tobacco product, uncomplicated: Secondary | ICD-10-CM | POA: Diagnosis not present

## 2020-02-26 DIAGNOSIS — S32501D Unspecified fracture of right pubis, subsequent encounter for fracture with routine healing: Secondary | ICD-10-CM | POA: Diagnosis not present

## 2020-02-26 DIAGNOSIS — S42201A Unspecified fracture of upper end of right humerus, initial encounter for closed fracture: Secondary | ICD-10-CM | POA: Diagnosis not present

## 2020-02-26 DIAGNOSIS — N390 Urinary tract infection, site not specified: Secondary | ICD-10-CM | POA: Diagnosis not present

## 2020-02-26 DIAGNOSIS — Z79890 Hormone replacement therapy (postmenopausal): Secondary | ICD-10-CM | POA: Diagnosis not present

## 2020-02-26 DIAGNOSIS — N2889 Other specified disorders of kidney and ureter: Secondary | ICD-10-CM | POA: Diagnosis not present

## 2020-02-26 DIAGNOSIS — Z9049 Acquired absence of other specified parts of digestive tract: Secondary | ICD-10-CM | POA: Diagnosis not present

## 2020-02-26 DIAGNOSIS — M75121 Complete rotator cuff tear or rupture of right shoulder, not specified as traumatic: Secondary | ICD-10-CM | POA: Diagnosis not present

## 2020-02-26 DIAGNOSIS — R945 Abnormal results of liver function studies: Secondary | ICD-10-CM | POA: Diagnosis not present

## 2020-02-26 DIAGNOSIS — J9611 Chronic respiratory failure with hypoxia: Secondary | ICD-10-CM | POA: Diagnosis not present

## 2020-02-26 DIAGNOSIS — Z9989 Dependence on other enabling machines and devices: Secondary | ICD-10-CM | POA: Diagnosis not present

## 2020-02-26 DIAGNOSIS — Z8546 Personal history of malignant neoplasm of prostate: Secondary | ICD-10-CM | POA: Diagnosis not present

## 2020-02-26 DIAGNOSIS — N200 Calculus of kidney: Secondary | ICD-10-CM | POA: Diagnosis not present

## 2020-02-26 DIAGNOSIS — Z1331 Encounter for screening for depression: Secondary | ICD-10-CM | POA: Diagnosis not present

## 2020-02-26 DIAGNOSIS — G35 Multiple sclerosis: Secondary | ICD-10-CM | POA: Diagnosis not present

## 2020-02-26 DIAGNOSIS — R072 Precordial pain: Secondary | ICD-10-CM | POA: Diagnosis not present

## 2020-02-26 DIAGNOSIS — L299 Pruritus, unspecified: Secondary | ICD-10-CM | POA: Diagnosis not present

## 2020-02-26 DIAGNOSIS — E1151 Type 2 diabetes mellitus with diabetic peripheral angiopathy without gangrene: Secondary | ICD-10-CM | POA: Diagnosis not present

## 2020-02-26 DIAGNOSIS — R0989 Other specified symptoms and signs involving the circulatory and respiratory systems: Secondary | ICD-10-CM | POA: Diagnosis not present

## 2020-02-26 DIAGNOSIS — Z7409 Other reduced mobility: Secondary | ICD-10-CM | POA: Diagnosis not present

## 2020-02-26 DIAGNOSIS — Z8616 Personal history of COVID-19: Secondary | ICD-10-CM | POA: Diagnosis not present

## 2020-02-26 DIAGNOSIS — J45901 Unspecified asthma with (acute) exacerbation: Secondary | ICD-10-CM | POA: Diagnosis not present

## 2020-02-26 DIAGNOSIS — E7800 Pure hypercholesterolemia, unspecified: Secondary | ICD-10-CM | POA: Diagnosis not present

## 2020-02-26 DIAGNOSIS — I739 Peripheral vascular disease, unspecified: Secondary | ICD-10-CM | POA: Diagnosis not present

## 2020-02-26 DIAGNOSIS — R0683 Snoring: Secondary | ICD-10-CM | POA: Diagnosis not present

## 2020-02-26 DIAGNOSIS — M545 Low back pain: Secondary | ICD-10-CM | POA: Diagnosis not present

## 2020-02-26 DIAGNOSIS — R229 Localized swelling, mass and lump, unspecified: Secondary | ICD-10-CM | POA: Diagnosis not present

## 2020-02-26 DIAGNOSIS — J431 Panlobular emphysema: Secondary | ICD-10-CM | POA: Diagnosis not present

## 2020-02-26 DIAGNOSIS — I10 Essential (primary) hypertension: Secondary | ICD-10-CM | POA: Diagnosis not present

## 2020-02-26 DIAGNOSIS — I639 Cerebral infarction, unspecified: Secondary | ICD-10-CM | POA: Diagnosis not present

## 2020-02-26 DIAGNOSIS — D649 Anemia, unspecified: Secondary | ICD-10-CM | POA: Diagnosis not present

## 2020-02-26 DIAGNOSIS — J9601 Acute respiratory failure with hypoxia: Secondary | ICD-10-CM | POA: Diagnosis not present

## 2020-02-26 DIAGNOSIS — R52 Pain, unspecified: Secondary | ICD-10-CM | POA: Diagnosis not present

## 2020-02-26 DIAGNOSIS — F05 Delirium due to known physiological condition: Secondary | ICD-10-CM | POA: Diagnosis not present

## 2020-02-26 DIAGNOSIS — L814 Other melanin hyperpigmentation: Secondary | ICD-10-CM | POA: Diagnosis not present

## 2020-02-26 DIAGNOSIS — Z87891 Personal history of nicotine dependence: Secondary | ICD-10-CM | POA: Diagnosis not present

## 2020-02-26 DIAGNOSIS — R42 Dizziness and giddiness: Secondary | ICD-10-CM | POA: Diagnosis not present

## 2020-02-26 DIAGNOSIS — J45909 Unspecified asthma, uncomplicated: Secondary | ICD-10-CM | POA: Diagnosis not present

## 2020-02-26 DIAGNOSIS — Z932 Ileostomy status: Secondary | ICD-10-CM | POA: Diagnosis not present

## 2020-02-26 DIAGNOSIS — R1111 Vomiting without nausea: Secondary | ICD-10-CM | POA: Diagnosis not present

## 2020-02-26 DIAGNOSIS — M48061 Spinal stenosis, lumbar region without neurogenic claudication: Secondary | ICD-10-CM | POA: Diagnosis not present

## 2020-02-26 DIAGNOSIS — L918 Other hypertrophic disorders of the skin: Secondary | ICD-10-CM | POA: Diagnosis not present

## 2020-02-26 DIAGNOSIS — R400 Somnolence: Secondary | ICD-10-CM | POA: Diagnosis not present

## 2020-02-26 DIAGNOSIS — M47816 Spondylosis without myelopathy or radiculopathy, lumbar region: Secondary | ICD-10-CM | POA: Diagnosis not present

## 2020-02-26 DIAGNOSIS — I272 Pulmonary hypertension, unspecified: Secondary | ICD-10-CM | POA: Diagnosis not present

## 2020-02-26 DIAGNOSIS — S82141D Displaced bicondylar fracture of right tibia, subsequent encounter for closed fracture with routine healing: Secondary | ICD-10-CM | POA: Diagnosis not present

## 2020-02-26 DIAGNOSIS — R079 Chest pain, unspecified: Secondary | ICD-10-CM | POA: Diagnosis not present

## 2020-02-26 DIAGNOSIS — R509 Fever, unspecified: Secondary | ICD-10-CM | POA: Diagnosis not present

## 2020-02-26 DIAGNOSIS — K529 Noninfective gastroenteritis and colitis, unspecified: Secondary | ICD-10-CM | POA: Diagnosis not present

## 2020-02-26 DIAGNOSIS — J4541 Moderate persistent asthma with (acute) exacerbation: Secondary | ICD-10-CM | POA: Diagnosis not present

## 2020-02-26 DIAGNOSIS — H9190 Unspecified hearing loss, unspecified ear: Secondary | ICD-10-CM | POA: Diagnosis not present

## 2020-02-26 DIAGNOSIS — Z9981 Dependence on supplemental oxygen: Secondary | ICD-10-CM | POA: Diagnosis not present

## 2020-02-26 DIAGNOSIS — M5136 Other intervertebral disc degeneration, lumbar region: Secondary | ICD-10-CM | POA: Diagnosis not present

## 2020-02-26 DIAGNOSIS — Z96653 Presence of artificial knee joint, bilateral: Secondary | ICD-10-CM | POA: Diagnosis not present

## 2020-02-26 DIAGNOSIS — R300 Dysuria: Secondary | ICD-10-CM | POA: Diagnosis not present

## 2020-02-26 DIAGNOSIS — B372 Candidiasis of skin and nail: Secondary | ICD-10-CM | POA: Diagnosis not present

## 2020-02-26 DIAGNOSIS — G459 Transient cerebral ischemic attack, unspecified: Secondary | ICD-10-CM | POA: Diagnosis not present

## 2020-02-26 DIAGNOSIS — Z4682 Encounter for fitting and adjustment of non-vascular catheter: Secondary | ICD-10-CM | POA: Diagnosis not present

## 2020-02-26 DIAGNOSIS — Z7982 Long term (current) use of aspirin: Secondary | ICD-10-CM | POA: Diagnosis not present

## 2020-02-26 DIAGNOSIS — K7460 Unspecified cirrhosis of liver: Secondary | ICD-10-CM | POA: Diagnosis not present

## 2020-02-26 DIAGNOSIS — E1165 Type 2 diabetes mellitus with hyperglycemia: Secondary | ICD-10-CM | POA: Diagnosis not present

## 2020-02-26 DIAGNOSIS — F418 Other specified anxiety disorders: Secondary | ICD-10-CM | POA: Diagnosis not present

## 2020-02-26 DIAGNOSIS — R110 Nausea: Secondary | ICD-10-CM | POA: Diagnosis not present

## 2020-02-26 DIAGNOSIS — E669 Obesity, unspecified: Secondary | ICD-10-CM | POA: Diagnosis not present

## 2020-02-26 DIAGNOSIS — Z1159 Encounter for screening for other viral diseases: Secondary | ICD-10-CM | POA: Diagnosis not present

## 2020-02-26 DIAGNOSIS — J441 Chronic obstructive pulmonary disease with (acute) exacerbation: Secondary | ICD-10-CM | POA: Diagnosis not present

## 2020-02-26 DIAGNOSIS — R9430 Abnormal result of cardiovascular function study, unspecified: Secondary | ICD-10-CM | POA: Diagnosis not present

## 2020-02-26 DIAGNOSIS — K5732 Diverticulitis of large intestine without perforation or abscess without bleeding: Secondary | ICD-10-CM | POA: Diagnosis not present

## 2020-02-26 DIAGNOSIS — N183 Chronic kidney disease, stage 3 unspecified: Secondary | ICD-10-CM | POA: Diagnosis not present

## 2020-02-26 DIAGNOSIS — I6621 Occlusion and stenosis of right posterior cerebral artery: Secondary | ICD-10-CM | POA: Diagnosis not present

## 2020-02-26 DIAGNOSIS — F0390 Unspecified dementia without behavioral disturbance: Secondary | ICD-10-CM | POA: Diagnosis not present

## 2020-02-26 DIAGNOSIS — Z888 Allergy status to other drugs, medicaments and biological substances status: Secondary | ICD-10-CM | POA: Diagnosis not present

## 2020-02-26 DIAGNOSIS — S13120A Subluxation of C1/C2 cervical vertebrae, initial encounter: Secondary | ICD-10-CM | POA: Diagnosis not present

## 2020-02-26 DIAGNOSIS — F17210 Nicotine dependence, cigarettes, uncomplicated: Secondary | ICD-10-CM | POA: Diagnosis not present

## 2020-02-26 DIAGNOSIS — E049 Nontoxic goiter, unspecified: Secondary | ICD-10-CM | POA: Diagnosis not present

## 2020-02-26 DIAGNOSIS — R270 Ataxia, unspecified: Secondary | ICD-10-CM | POA: Diagnosis not present

## 2020-02-26 DIAGNOSIS — B373 Candidiasis of vulva and vagina: Secondary | ICD-10-CM | POA: Diagnosis not present

## 2020-02-26 DIAGNOSIS — R001 Bradycardia, unspecified: Secondary | ICD-10-CM | POA: Diagnosis not present

## 2020-02-26 DIAGNOSIS — L820 Inflamed seborrheic keratosis: Secondary | ICD-10-CM | POA: Diagnosis not present

## 2020-02-26 DIAGNOSIS — F329 Major depressive disorder, single episode, unspecified: Secondary | ICD-10-CM | POA: Diagnosis not present

## 2020-02-26 DIAGNOSIS — W19XXXA Unspecified fall, initial encounter: Secondary | ICD-10-CM | POA: Diagnosis not present

## 2020-02-26 DIAGNOSIS — R0602 Shortness of breath: Secondary | ICD-10-CM | POA: Diagnosis not present

## 2020-02-26 DIAGNOSIS — S270XXA Traumatic pneumothorax, initial encounter: Secondary | ICD-10-CM | POA: Diagnosis not present

## 2020-02-26 DIAGNOSIS — E875 Hyperkalemia: Secondary | ICD-10-CM | POA: Diagnosis not present

## 2020-02-26 DIAGNOSIS — J440 Chronic obstructive pulmonary disease with acute lower respiratory infection: Secondary | ICD-10-CM | POA: Diagnosis not present

## 2020-02-26 DIAGNOSIS — S79911A Unspecified injury of right hip, initial encounter: Secondary | ICD-10-CM | POA: Diagnosis not present

## 2020-02-26 DIAGNOSIS — M25552 Pain in left hip: Secondary | ICD-10-CM | POA: Diagnosis not present

## 2020-02-26 DIAGNOSIS — R200 Anesthesia of skin: Secondary | ICD-10-CM | POA: Diagnosis not present

## 2020-02-26 DIAGNOSIS — A419 Sepsis, unspecified organism: Secondary | ICD-10-CM | POA: Diagnosis not present

## 2020-02-26 DIAGNOSIS — E119 Type 2 diabetes mellitus without complications: Secondary | ICD-10-CM | POA: Diagnosis not present

## 2020-02-26 DIAGNOSIS — N189 Chronic kidney disease, unspecified: Secondary | ICD-10-CM | POA: Diagnosis not present

## 2020-02-26 DIAGNOSIS — J209 Acute bronchitis, unspecified: Secondary | ICD-10-CM | POA: Diagnosis not present

## 2020-02-26 DIAGNOSIS — M75122 Complete rotator cuff tear or rupture of left shoulder, not specified as traumatic: Secondary | ICD-10-CM | POA: Diagnosis not present

## 2020-02-26 DIAGNOSIS — I5032 Chronic diastolic (congestive) heart failure: Secondary | ICD-10-CM | POA: Diagnosis not present

## 2020-02-26 DIAGNOSIS — M4802 Spinal stenosis, cervical region: Secondary | ICD-10-CM | POA: Diagnosis not present

## 2020-02-26 DIAGNOSIS — H353132 Nonexudative age-related macular degeneration, bilateral, intermediate dry stage: Secondary | ICD-10-CM | POA: Diagnosis not present

## 2020-02-26 DIAGNOSIS — I498 Other specified cardiac arrhythmias: Secondary | ICD-10-CM | POA: Diagnosis not present

## 2020-02-26 DIAGNOSIS — I480 Paroxysmal atrial fibrillation: Secondary | ICD-10-CM | POA: Diagnosis not present

## 2020-02-26 DIAGNOSIS — T881XXA Other complications following immunization, not elsewhere classified, initial encounter: Secondary | ICD-10-CM | POA: Diagnosis not present

## 2020-02-26 DIAGNOSIS — I252 Old myocardial infarction: Secondary | ICD-10-CM | POA: Diagnosis not present

## 2020-02-26 DIAGNOSIS — Z20828 Contact with and (suspected) exposure to other viral communicable diseases: Secondary | ICD-10-CM | POA: Diagnosis not present

## 2020-02-26 DIAGNOSIS — R3915 Urgency of urination: Secondary | ICD-10-CM | POA: Diagnosis not present

## 2020-02-26 DIAGNOSIS — M6281 Muscle weakness (generalized): Secondary | ICD-10-CM | POA: Diagnosis not present

## 2020-02-26 DIAGNOSIS — M19041 Primary osteoarthritis, right hand: Secondary | ICD-10-CM | POA: Diagnosis not present

## 2020-02-26 DIAGNOSIS — Z91030 Bee allergy status: Secondary | ICD-10-CM | POA: Diagnosis not present

## 2020-02-26 DIAGNOSIS — I872 Venous insufficiency (chronic) (peripheral): Secondary | ICD-10-CM | POA: Diagnosis not present

## 2020-02-26 DIAGNOSIS — Z96641 Presence of right artificial hip joint: Secondary | ICD-10-CM | POA: Diagnosis not present

## 2020-02-26 DIAGNOSIS — Z20822 Contact with and (suspected) exposure to covid-19: Secondary | ICD-10-CM | POA: Diagnosis not present

## 2020-02-26 DIAGNOSIS — I959 Hypotension, unspecified: Secondary | ICD-10-CM | POA: Diagnosis not present

## 2020-02-26 DIAGNOSIS — J9620 Acute and chronic respiratory failure, unspecified whether with hypoxia or hypercapnia: Secondary | ICD-10-CM | POA: Diagnosis not present

## 2020-02-26 DIAGNOSIS — M79644 Pain in right finger(s): Secondary | ICD-10-CM | POA: Diagnosis not present

## 2020-02-26 DIAGNOSIS — I6502 Occlusion and stenosis of left vertebral artery: Secondary | ICD-10-CM | POA: Diagnosis not present

## 2020-02-26 DIAGNOSIS — Z720 Tobacco use: Secondary | ICD-10-CM | POA: Diagnosis not present

## 2020-02-26 DIAGNOSIS — M1990 Unspecified osteoarthritis, unspecified site: Secondary | ICD-10-CM | POA: Diagnosis not present

## 2020-02-26 DIAGNOSIS — J84114 Acute interstitial pneumonitis: Secondary | ICD-10-CM | POA: Diagnosis not present

## 2020-02-26 DIAGNOSIS — M79604 Pain in right leg: Secondary | ICD-10-CM | POA: Diagnosis not present

## 2020-02-26 DIAGNOSIS — I6930 Unspecified sequelae of cerebral infarction: Secondary | ICD-10-CM | POA: Diagnosis not present

## 2020-02-26 DIAGNOSIS — R05 Cough: Secondary | ICD-10-CM | POA: Diagnosis not present

## 2020-02-26 DIAGNOSIS — Z9181 History of falling: Secondary | ICD-10-CM | POA: Diagnosis not present

## 2020-02-26 DIAGNOSIS — R202 Paresthesia of skin: Secondary | ICD-10-CM | POA: Diagnosis not present

## 2020-02-26 DIAGNOSIS — E039 Hypothyroidism, unspecified: Secondary | ICD-10-CM | POA: Diagnosis not present

## 2020-02-26 DIAGNOSIS — Z86718 Personal history of other venous thrombosis and embolism: Secondary | ICD-10-CM | POA: Diagnosis not present

## 2020-02-26 DIAGNOSIS — Z952 Presence of prosthetic heart valve: Secondary | ICD-10-CM | POA: Diagnosis not present

## 2020-02-26 DIAGNOSIS — R262 Difficulty in walking, not elsewhere classified: Secondary | ICD-10-CM | POA: Diagnosis not present

## 2020-02-26 DIAGNOSIS — H903 Sensorineural hearing loss, bilateral: Secondary | ICD-10-CM | POA: Diagnosis not present

## 2020-02-26 DIAGNOSIS — M25411 Effusion, right shoulder: Secondary | ICD-10-CM | POA: Diagnosis not present

## 2020-02-26 DIAGNOSIS — S0230XA Fracture of orbital floor, unspecified side, initial encounter for closed fracture: Secondary | ICD-10-CM | POA: Diagnosis not present

## 2020-02-26 DIAGNOSIS — I361 Nonrheumatic tricuspid (valve) insufficiency: Secondary | ICD-10-CM | POA: Diagnosis not present

## 2020-02-26 DIAGNOSIS — Z9911 Dependence on respirator [ventilator] status: Secondary | ICD-10-CM | POA: Diagnosis not present

## 2020-02-26 DIAGNOSIS — I69328 Other speech and language deficits following cerebral infarction: Secondary | ICD-10-CM | POA: Diagnosis not present

## 2020-02-26 DIAGNOSIS — M6282 Rhabdomyolysis: Secondary | ICD-10-CM | POA: Diagnosis not present

## 2020-02-26 DIAGNOSIS — F0391 Unspecified dementia with behavioral disturbance: Secondary | ICD-10-CM | POA: Diagnosis not present

## 2020-02-26 DIAGNOSIS — R069 Unspecified abnormalities of breathing: Secondary | ICD-10-CM | POA: Diagnosis not present

## 2020-02-26 DIAGNOSIS — R600 Localized edema: Secondary | ICD-10-CM | POA: Diagnosis not present

## 2020-02-26 DIAGNOSIS — I129 Hypertensive chronic kidney disease with stage 1 through stage 4 chronic kidney disease, or unspecified chronic kidney disease: Secondary | ICD-10-CM | POA: Diagnosis not present

## 2020-02-26 DIAGNOSIS — Z8719 Personal history of other diseases of the digestive system: Secondary | ICD-10-CM | POA: Diagnosis not present

## 2020-02-26 DIAGNOSIS — S199XXA Unspecified injury of neck, initial encounter: Secondary | ICD-10-CM | POA: Diagnosis not present

## 2020-02-26 DIAGNOSIS — R5383 Other fatigue: Secondary | ICD-10-CM | POA: Diagnosis not present

## 2020-02-26 DIAGNOSIS — W0110XA Fall on same level from slipping, tripping and stumbling with subsequent striking against unspecified object, initial encounter: Secondary | ICD-10-CM | POA: Diagnosis not present

## 2020-02-26 DIAGNOSIS — Z79899 Other long term (current) drug therapy: Secondary | ICD-10-CM | POA: Diagnosis not present

## 2020-02-26 DIAGNOSIS — Z882 Allergy status to sulfonamides status: Secondary | ICD-10-CM | POA: Diagnosis not present

## 2020-02-26 DIAGNOSIS — M069 Rheumatoid arthritis, unspecified: Secondary | ICD-10-CM | POA: Diagnosis not present

## 2020-02-26 DIAGNOSIS — J439 Emphysema, unspecified: Secondary | ICD-10-CM | POA: Diagnosis not present

## 2020-02-26 DIAGNOSIS — M5416 Radiculopathy, lumbar region: Secondary | ICD-10-CM | POA: Diagnosis not present

## 2020-02-26 DIAGNOSIS — Z9119 Patient's noncompliance with other medical treatment and regimen: Secondary | ICD-10-CM | POA: Diagnosis not present

## 2020-02-26 DIAGNOSIS — D1801 Hemangioma of skin and subcutaneous tissue: Secondary | ICD-10-CM | POA: Diagnosis not present

## 2020-02-26 DIAGNOSIS — R519 Headache, unspecified: Secondary | ICD-10-CM | POA: Diagnosis not present

## 2020-02-26 DIAGNOSIS — S2249XA Multiple fractures of ribs, unspecified side, initial encounter for closed fracture: Secondary | ICD-10-CM | POA: Diagnosis not present

## 2020-02-26 DIAGNOSIS — S0003XA Contusion of scalp, initial encounter: Secondary | ICD-10-CM | POA: Diagnosis not present

## 2020-02-26 DIAGNOSIS — R4182 Altered mental status, unspecified: Secondary | ICD-10-CM | POA: Diagnosis not present

## 2020-02-26 DIAGNOSIS — E890 Postprocedural hypothyroidism: Secondary | ICD-10-CM | POA: Diagnosis not present

## 2020-02-26 DIAGNOSIS — Z6829 Body mass index (BMI) 29.0-29.9, adult: Secondary | ICD-10-CM | POA: Diagnosis not present

## 2020-02-26 DIAGNOSIS — E11649 Type 2 diabetes mellitus with hypoglycemia without coma: Secondary | ICD-10-CM | POA: Diagnosis not present

## 2020-02-26 DIAGNOSIS — R609 Edema, unspecified: Secondary | ICD-10-CM | POA: Diagnosis not present

## 2020-02-26 DIAGNOSIS — S42211A Unspecified displaced fracture of surgical neck of right humerus, initial encounter for closed fracture: Secondary | ICD-10-CM | POA: Diagnosis not present

## 2020-02-26 DIAGNOSIS — S51811A Laceration without foreign body of right forearm, initial encounter: Secondary | ICD-10-CM | POA: Diagnosis not present

## 2020-02-26 DIAGNOSIS — Z139 Encounter for screening, unspecified: Secondary | ICD-10-CM | POA: Diagnosis not present

## 2020-02-26 DIAGNOSIS — Z86711 Personal history of pulmonary embolism: Secondary | ICD-10-CM | POA: Diagnosis not present

## 2020-02-26 DIAGNOSIS — Z85828 Personal history of other malignant neoplasm of skin: Secondary | ICD-10-CM | POA: Diagnosis not present

## 2020-02-26 DIAGNOSIS — Z85038 Personal history of other malignant neoplasm of large intestine: Secondary | ICD-10-CM | POA: Diagnosis not present

## 2020-02-26 DIAGNOSIS — Z6830 Body mass index (BMI) 30.0-30.9, adult: Secondary | ICD-10-CM | POA: Diagnosis not present

## 2020-02-26 DIAGNOSIS — G40901 Epilepsy, unspecified, not intractable, with status epilepticus: Secondary | ICD-10-CM | POA: Diagnosis not present

## 2020-02-26 DIAGNOSIS — D509 Iron deficiency anemia, unspecified: Secondary | ICD-10-CM | POA: Diagnosis not present

## 2020-02-26 DIAGNOSIS — I2510 Atherosclerotic heart disease of native coronary artery without angina pectoris: Secondary | ICD-10-CM | POA: Diagnosis not present

## 2020-02-26 DIAGNOSIS — S32010D Wedge compression fracture of first lumbar vertebra, subsequent encounter for fracture with routine healing: Secondary | ICD-10-CM | POA: Diagnosis not present

## 2020-02-26 DIAGNOSIS — J9621 Acute and chronic respiratory failure with hypoxia: Secondary | ICD-10-CM | POA: Diagnosis not present

## 2020-02-26 DIAGNOSIS — R29818 Other symptoms and signs involving the nervous system: Secondary | ICD-10-CM | POA: Diagnosis not present

## 2020-02-26 DIAGNOSIS — F1010 Alcohol abuse, uncomplicated: Secondary | ICD-10-CM | POA: Diagnosis not present

## 2020-02-26 DIAGNOSIS — Z954 Presence of other heart-valve replacement: Secondary | ICD-10-CM | POA: Diagnosis not present

## 2020-02-26 DIAGNOSIS — M25512 Pain in left shoulder: Secondary | ICD-10-CM | POA: Diagnosis not present

## 2020-02-26 DIAGNOSIS — R197 Diarrhea, unspecified: Secondary | ICD-10-CM | POA: Diagnosis not present

## 2020-02-26 DIAGNOSIS — J939 Pneumothorax, unspecified: Secondary | ICD-10-CM | POA: Diagnosis not present

## 2020-02-26 DIAGNOSIS — E785 Hyperlipidemia, unspecified: Secondary | ICD-10-CM | POA: Diagnosis not present

## 2020-02-26 DIAGNOSIS — L821 Other seborrheic keratosis: Secondary | ICD-10-CM | POA: Diagnosis not present

## 2020-02-26 DIAGNOSIS — S2241XA Multiple fractures of ribs, right side, initial encounter for closed fracture: Secondary | ICD-10-CM | POA: Diagnosis not present

## 2020-02-26 DIAGNOSIS — T798XXA Other early complications of trauma, initial encounter: Secondary | ICD-10-CM | POA: Diagnosis not present

## 2020-02-26 DIAGNOSIS — G2581 Restless legs syndrome: Secondary | ICD-10-CM | POA: Diagnosis not present

## 2020-02-26 DIAGNOSIS — S4991XA Unspecified injury of right shoulder and upper arm, initial encounter: Secondary | ICD-10-CM | POA: Diagnosis not present

## 2020-02-26 DIAGNOSIS — Z955 Presence of coronary angioplasty implant and graft: Secondary | ICD-10-CM | POA: Diagnosis not present

## 2020-02-26 DIAGNOSIS — L300 Nummular dermatitis: Secondary | ICD-10-CM | POA: Diagnosis not present

## 2020-02-26 DIAGNOSIS — Z85118 Personal history of other malignant neoplasm of bronchus and lung: Secondary | ICD-10-CM | POA: Diagnosis not present

## 2020-02-26 DIAGNOSIS — K760 Fatty (change of) liver, not elsewhere classified: Secondary | ICD-10-CM | POA: Diagnosis not present

## 2020-02-27 DIAGNOSIS — R251 Tremor, unspecified: Secondary | ICD-10-CM | POA: Diagnosis not present

## 2020-02-27 DIAGNOSIS — F319 Bipolar disorder, unspecified: Secondary | ICD-10-CM | POA: Diagnosis not present

## 2020-02-27 DIAGNOSIS — R55 Syncope and collapse: Secondary | ICD-10-CM | POA: Diagnosis not present

## 2020-02-27 DIAGNOSIS — R6521 Severe sepsis with septic shock: Secondary | ICD-10-CM | POA: Diagnosis not present

## 2020-02-27 DIAGNOSIS — I214 Non-ST elevation (NSTEMI) myocardial infarction: Secondary | ICD-10-CM | POA: Diagnosis not present

## 2020-02-27 DIAGNOSIS — G629 Polyneuropathy, unspecified: Secondary | ICD-10-CM | POA: Diagnosis not present

## 2020-02-27 DIAGNOSIS — R202 Paresthesia of skin: Secondary | ICD-10-CM | POA: Diagnosis not present

## 2020-02-27 DIAGNOSIS — R0609 Other forms of dyspnea: Secondary | ICD-10-CM | POA: Diagnosis not present

## 2020-02-27 DIAGNOSIS — G9389 Other specified disorders of brain: Secondary | ICD-10-CM | POA: Diagnosis not present

## 2020-02-27 DIAGNOSIS — Z7951 Long term (current) use of inhaled steroids: Secondary | ICD-10-CM | POA: Diagnosis not present

## 2020-02-27 DIAGNOSIS — Z7982 Long term (current) use of aspirin: Secondary | ICD-10-CM | POA: Diagnosis not present

## 2020-02-27 DIAGNOSIS — L89626 Pressure-induced deep tissue damage of left heel: Secondary | ICD-10-CM | POA: Diagnosis not present

## 2020-02-27 DIAGNOSIS — M1812 Unilateral primary osteoarthritis of first carpometacarpal joint, left hand: Secondary | ICD-10-CM | POA: Diagnosis not present

## 2020-02-27 DIAGNOSIS — M797 Fibromyalgia: Secondary | ICD-10-CM | POA: Diagnosis not present

## 2020-02-27 DIAGNOSIS — R110 Nausea: Secondary | ICD-10-CM | POA: Diagnosis not present

## 2020-02-27 DIAGNOSIS — Z20822 Contact with and (suspected) exposure to covid-19: Secondary | ICD-10-CM | POA: Diagnosis not present

## 2020-02-27 DIAGNOSIS — Z681 Body mass index (BMI) 19 or less, adult: Secondary | ICD-10-CM | POA: Diagnosis not present

## 2020-02-27 DIAGNOSIS — J3489 Other specified disorders of nose and nasal sinuses: Secondary | ICD-10-CM | POA: Diagnosis not present

## 2020-02-27 DIAGNOSIS — R350 Frequency of micturition: Secondary | ICD-10-CM | POA: Diagnosis not present

## 2020-02-27 DIAGNOSIS — A4189 Other specified sepsis: Secondary | ICD-10-CM | POA: Diagnosis not present

## 2020-02-27 DIAGNOSIS — F17210 Nicotine dependence, cigarettes, uncomplicated: Secondary | ICD-10-CM | POA: Diagnosis not present

## 2020-02-27 DIAGNOSIS — R404 Transient alteration of awareness: Secondary | ICD-10-CM | POA: Diagnosis not present

## 2020-02-27 DIAGNOSIS — I714 Abdominal aortic aneurysm, without rupture: Secondary | ICD-10-CM | POA: Diagnosis not present

## 2020-02-27 DIAGNOSIS — S60511A Abrasion of right hand, initial encounter: Secondary | ICD-10-CM | POA: Diagnosis not present

## 2020-02-27 DIAGNOSIS — I491 Atrial premature depolarization: Secondary | ICD-10-CM | POA: Diagnosis not present

## 2020-02-27 DIAGNOSIS — J45909 Unspecified asthma, uncomplicated: Secondary | ICD-10-CM | POA: Diagnosis not present

## 2020-02-27 DIAGNOSIS — B070 Plantar wart: Secondary | ICD-10-CM | POA: Diagnosis not present

## 2020-02-27 DIAGNOSIS — R778 Other specified abnormalities of plasma proteins: Secondary | ICD-10-CM | POA: Diagnosis not present

## 2020-02-27 DIAGNOSIS — C099 Malignant neoplasm of tonsil, unspecified: Secondary | ICD-10-CM | POA: Diagnosis not present

## 2020-02-27 DIAGNOSIS — I82622 Acute embolism and thrombosis of deep veins of left upper extremity: Secondary | ICD-10-CM | POA: Diagnosis not present

## 2020-02-27 DIAGNOSIS — Z955 Presence of coronary angioplasty implant and graft: Secondary | ICD-10-CM | POA: Diagnosis not present

## 2020-02-27 DIAGNOSIS — R4189 Other symptoms and signs involving cognitive functions and awareness: Secondary | ICD-10-CM | POA: Diagnosis not present

## 2020-02-27 DIAGNOSIS — Z452 Encounter for adjustment and management of vascular access device: Secondary | ICD-10-CM | POA: Diagnosis not present

## 2020-02-27 DIAGNOSIS — I639 Cerebral infarction, unspecified: Secondary | ICD-10-CM | POA: Diagnosis not present

## 2020-02-27 DIAGNOSIS — F0390 Unspecified dementia without behavioral disturbance: Secondary | ICD-10-CM | POA: Diagnosis not present

## 2020-02-27 DIAGNOSIS — I110 Hypertensive heart disease with heart failure: Secondary | ICD-10-CM | POA: Diagnosis not present

## 2020-02-27 DIAGNOSIS — Z79890 Hormone replacement therapy (postmenopausal): Secondary | ICD-10-CM | POA: Diagnosis not present

## 2020-02-27 DIAGNOSIS — H353132 Nonexudative age-related macular degeneration, bilateral, intermediate dry stage: Secondary | ICD-10-CM | POA: Diagnosis not present

## 2020-02-27 DIAGNOSIS — H524 Presbyopia: Secondary | ICD-10-CM | POA: Diagnosis not present

## 2020-02-27 DIAGNOSIS — R8290 Unspecified abnormal findings in urine: Secondary | ICD-10-CM | POA: Diagnosis not present

## 2020-02-27 DIAGNOSIS — I25119 Atherosclerotic heart disease of native coronary artery with unspecified angina pectoris: Secondary | ICD-10-CM | POA: Diagnosis not present

## 2020-02-27 DIAGNOSIS — R509 Fever, unspecified: Secondary | ICD-10-CM | POA: Diagnosis not present

## 2020-02-27 DIAGNOSIS — N179 Acute kidney failure, unspecified: Secondary | ICD-10-CM | POA: Diagnosis not present

## 2020-02-27 DIAGNOSIS — Z20828 Contact with and (suspected) exposure to other viral communicable diseases: Secondary | ICD-10-CM | POA: Diagnosis not present

## 2020-02-27 DIAGNOSIS — L89611 Pressure ulcer of right heel, stage 1: Secondary | ICD-10-CM | POA: Diagnosis not present

## 2020-02-27 DIAGNOSIS — I10 Essential (primary) hypertension: Secondary | ICD-10-CM | POA: Diagnosis not present

## 2020-02-27 DIAGNOSIS — Z7401 Bed confinement status: Secondary | ICD-10-CM | POA: Diagnosis not present

## 2020-02-27 DIAGNOSIS — H2512 Age-related nuclear cataract, left eye: Secondary | ICD-10-CM | POA: Diagnosis not present

## 2020-02-27 DIAGNOSIS — H7290 Unspecified perforation of tympanic membrane, unspecified ear: Secondary | ICD-10-CM | POA: Diagnosis not present

## 2020-02-27 DIAGNOSIS — K625 Hemorrhage of anus and rectum: Secondary | ICD-10-CM | POA: Diagnosis not present

## 2020-02-27 DIAGNOSIS — R6520 Severe sepsis without septic shock: Secondary | ICD-10-CM | POA: Diagnosis not present

## 2020-02-27 DIAGNOSIS — Z8616 Personal history of COVID-19: Secondary | ICD-10-CM | POA: Diagnosis not present

## 2020-02-27 DIAGNOSIS — S0083XA Contusion of other part of head, initial encounter: Secondary | ICD-10-CM | POA: Diagnosis not present

## 2020-02-27 DIAGNOSIS — Z7409 Other reduced mobility: Secondary | ICD-10-CM | POA: Diagnosis not present

## 2020-02-27 DIAGNOSIS — Z880 Allergy status to penicillin: Secondary | ICD-10-CM | POA: Diagnosis not present

## 2020-02-27 DIAGNOSIS — Z881 Allergy status to other antibiotic agents status: Secondary | ICD-10-CM | POA: Diagnosis not present

## 2020-02-27 DIAGNOSIS — Z91040 Latex allergy status: Secondary | ICD-10-CM | POA: Diagnosis not present

## 2020-02-27 DIAGNOSIS — R9082 White matter disease, unspecified: Secondary | ICD-10-CM | POA: Diagnosis not present

## 2020-02-27 DIAGNOSIS — I252 Old myocardial infarction: Secondary | ICD-10-CM | POA: Diagnosis not present

## 2020-02-27 DIAGNOSIS — S52501A Unspecified fracture of the lower end of right radius, initial encounter for closed fracture: Secondary | ICD-10-CM | POA: Diagnosis not present

## 2020-02-27 DIAGNOSIS — Z131 Encounter for screening for diabetes mellitus: Secondary | ICD-10-CM | POA: Diagnosis not present

## 2020-02-27 DIAGNOSIS — R600 Localized edema: Secondary | ICD-10-CM | POA: Diagnosis not present

## 2020-02-27 DIAGNOSIS — I951 Orthostatic hypotension: Secondary | ICD-10-CM | POA: Diagnosis not present

## 2020-02-27 DIAGNOSIS — R0689 Other abnormalities of breathing: Secondary | ICD-10-CM | POA: Diagnosis not present

## 2020-02-27 DIAGNOSIS — R410 Disorientation, unspecified: Secondary | ICD-10-CM | POA: Diagnosis not present

## 2020-02-27 DIAGNOSIS — H9209 Otalgia, unspecified ear: Secondary | ICD-10-CM | POA: Diagnosis not present

## 2020-02-27 DIAGNOSIS — S90811A Abrasion, right foot, initial encounter: Secondary | ICD-10-CM | POA: Diagnosis not present

## 2020-02-27 DIAGNOSIS — T8450XA Infection and inflammatory reaction due to unspecified internal joint prosthesis, initial encounter: Secondary | ICD-10-CM | POA: Diagnosis not present

## 2020-02-27 DIAGNOSIS — R109 Unspecified abdominal pain: Secondary | ICD-10-CM | POA: Diagnosis not present

## 2020-02-27 DIAGNOSIS — U071 COVID-19: Secondary | ICD-10-CM | POA: Diagnosis not present

## 2020-02-27 DIAGNOSIS — I739 Peripheral vascular disease, unspecified: Secondary | ICD-10-CM | POA: Diagnosis not present

## 2020-02-27 DIAGNOSIS — I5033 Acute on chronic diastolic (congestive) heart failure: Secondary | ICD-10-CM | POA: Diagnosis not present

## 2020-02-27 DIAGNOSIS — Y92009 Unspecified place in unspecified non-institutional (private) residence as the place of occurrence of the external cause: Secondary | ICD-10-CM | POA: Diagnosis not present

## 2020-02-27 DIAGNOSIS — J9690 Respiratory failure, unspecified, unspecified whether with hypoxia or hypercapnia: Secondary | ICD-10-CM | POA: Diagnosis not present

## 2020-02-27 DIAGNOSIS — Z113 Encounter for screening for infections with a predominantly sexual mode of transmission: Secondary | ICD-10-CM | POA: Diagnosis not present

## 2020-02-27 DIAGNOSIS — M00011 Staphylococcal arthritis, right shoulder: Secondary | ICD-10-CM | POA: Diagnosis not present

## 2020-02-27 DIAGNOSIS — C61 Malignant neoplasm of prostate: Secondary | ICD-10-CM | POA: Diagnosis not present

## 2020-02-27 DIAGNOSIS — I5022 Chronic systolic (congestive) heart failure: Secondary | ICD-10-CM | POA: Diagnosis not present

## 2020-02-27 DIAGNOSIS — K589 Irritable bowel syndrome without diarrhea: Secondary | ICD-10-CM | POA: Diagnosis not present

## 2020-02-27 DIAGNOSIS — M545 Low back pain: Secondary | ICD-10-CM | POA: Diagnosis not present

## 2020-02-27 DIAGNOSIS — J029 Acute pharyngitis, unspecified: Secondary | ICD-10-CM | POA: Diagnosis not present

## 2020-02-27 DIAGNOSIS — J449 Chronic obstructive pulmonary disease, unspecified: Secondary | ICD-10-CM | POA: Diagnosis not present

## 2020-02-27 DIAGNOSIS — S52531A Colles' fracture of right radius, initial encounter for closed fracture: Secondary | ICD-10-CM | POA: Diagnosis not present

## 2020-02-27 DIAGNOSIS — E042 Nontoxic multinodular goiter: Secondary | ICD-10-CM | POA: Diagnosis not present

## 2020-02-27 DIAGNOSIS — S13120A Subluxation of C1/C2 cervical vertebrae, initial encounter: Secondary | ICD-10-CM | POA: Diagnosis not present

## 2020-02-27 DIAGNOSIS — I351 Nonrheumatic aortic (valve) insufficiency: Secondary | ICD-10-CM | POA: Diagnosis not present

## 2020-02-27 DIAGNOSIS — R627 Adult failure to thrive: Secondary | ICD-10-CM | POA: Diagnosis not present

## 2020-02-27 DIAGNOSIS — R001 Bradycardia, unspecified: Secondary | ICD-10-CM | POA: Diagnosis not present

## 2020-02-27 DIAGNOSIS — G40901 Epilepsy, unspecified, not intractable, with status epilepticus: Secondary | ICD-10-CM | POA: Diagnosis not present

## 2020-02-27 DIAGNOSIS — G4700 Insomnia, unspecified: Secondary | ICD-10-CM | POA: Diagnosis not present

## 2020-02-27 DIAGNOSIS — H8110 Benign paroxysmal vertigo, unspecified ear: Secondary | ICD-10-CM | POA: Diagnosis not present

## 2020-02-27 DIAGNOSIS — J189 Pneumonia, unspecified organism: Secondary | ICD-10-CM | POA: Diagnosis not present

## 2020-02-27 DIAGNOSIS — N189 Chronic kidney disease, unspecified: Secondary | ICD-10-CM | POA: Diagnosis not present

## 2020-02-27 DIAGNOSIS — I499 Cardiac arrhythmia, unspecified: Secondary | ICD-10-CM | POA: Diagnosis not present

## 2020-02-27 DIAGNOSIS — E871 Hypo-osmolality and hyponatremia: Secondary | ICD-10-CM | POA: Diagnosis not present

## 2020-02-27 DIAGNOSIS — Z13228 Encounter for screening for other metabolic disorders: Secondary | ICD-10-CM | POA: Diagnosis not present

## 2020-02-27 DIAGNOSIS — I350 Nonrheumatic aortic (valve) stenosis: Secondary | ICD-10-CM | POA: Diagnosis not present

## 2020-02-27 DIAGNOSIS — D510 Vitamin B12 deficiency anemia due to intrinsic factor deficiency: Secondary | ICD-10-CM | POA: Diagnosis not present

## 2020-02-27 DIAGNOSIS — Z89612 Acquired absence of left leg above knee: Secondary | ICD-10-CM | POA: Diagnosis not present

## 2020-02-27 DIAGNOSIS — D122 Benign neoplasm of ascending colon: Secondary | ICD-10-CM | POA: Diagnosis not present

## 2020-02-27 DIAGNOSIS — M2550 Pain in unspecified joint: Secondary | ICD-10-CM | POA: Diagnosis not present

## 2020-02-27 DIAGNOSIS — Z9981 Dependence on supplemental oxygen: Secondary | ICD-10-CM | POA: Diagnosis not present

## 2020-02-27 DIAGNOSIS — I700 Atherosclerosis of aorta: Secondary | ICD-10-CM | POA: Diagnosis not present

## 2020-02-27 DIAGNOSIS — E7800 Pure hypercholesterolemia, unspecified: Secondary | ICD-10-CM | POA: Diagnosis not present

## 2020-02-27 DIAGNOSIS — D649 Anemia, unspecified: Secondary | ICD-10-CM | POA: Diagnosis not present

## 2020-02-27 DIAGNOSIS — M25531 Pain in right wrist: Secondary | ICD-10-CM | POA: Diagnosis not present

## 2020-02-27 DIAGNOSIS — T84193A Other mechanical complication of internal fixation device of bone of left forearm, initial encounter: Secondary | ICD-10-CM | POA: Diagnosis not present

## 2020-02-27 DIAGNOSIS — Z96642 Presence of left artificial hip joint: Secondary | ICD-10-CM | POA: Diagnosis not present

## 2020-02-27 DIAGNOSIS — R0602 Shortness of breath: Secondary | ICD-10-CM | POA: Diagnosis not present

## 2020-02-27 DIAGNOSIS — Z7984 Long term (current) use of oral hypoglycemic drugs: Secondary | ICD-10-CM | POA: Diagnosis not present

## 2020-02-27 DIAGNOSIS — K219 Gastro-esophageal reflux disease without esophagitis: Secondary | ICD-10-CM | POA: Diagnosis not present

## 2020-02-27 DIAGNOSIS — L309 Dermatitis, unspecified: Secondary | ICD-10-CM | POA: Diagnosis not present

## 2020-02-27 DIAGNOSIS — Z130 Encounter for screening for diseases of the blood and blood-forming organs and certain disorders involving the immune mechanism: Secondary | ICD-10-CM | POA: Diagnosis not present

## 2020-02-27 DIAGNOSIS — S52002A Unspecified fracture of upper end of left ulna, initial encounter for closed fracture: Secondary | ICD-10-CM | POA: Diagnosis not present

## 2020-02-27 DIAGNOSIS — J80 Acute respiratory distress syndrome: Secondary | ICD-10-CM | POA: Diagnosis not present

## 2020-02-27 DIAGNOSIS — R93421 Abnormal radiologic findings on diagnostic imaging of right kidney: Secondary | ICD-10-CM | POA: Diagnosis not present

## 2020-02-27 DIAGNOSIS — E872 Acidosis: Secondary | ICD-10-CM | POA: Diagnosis not present

## 2020-02-27 DIAGNOSIS — S20212A Contusion of left front wall of thorax, initial encounter: Secondary | ICD-10-CM | POA: Diagnosis not present

## 2020-02-27 DIAGNOSIS — S299XXA Unspecified injury of thorax, initial encounter: Secondary | ICD-10-CM | POA: Diagnosis not present

## 2020-02-27 DIAGNOSIS — Z888 Allergy status to other drugs, medicaments and biological substances status: Secondary | ICD-10-CM | POA: Diagnosis not present

## 2020-02-27 DIAGNOSIS — F69 Unspecified disorder of adult personality and behavior: Secondary | ICD-10-CM | POA: Diagnosis not present

## 2020-02-27 DIAGNOSIS — F3341 Major depressive disorder, recurrent, in partial remission: Secondary | ICD-10-CM | POA: Diagnosis not present

## 2020-02-27 DIAGNOSIS — F329 Major depressive disorder, single episode, unspecified: Secondary | ICD-10-CM | POA: Diagnosis not present

## 2020-02-27 DIAGNOSIS — E876 Hypokalemia: Secondary | ICD-10-CM | POA: Diagnosis not present

## 2020-02-27 DIAGNOSIS — J0141 Acute recurrent pansinusitis: Secondary | ICD-10-CM | POA: Diagnosis not present

## 2020-02-27 DIAGNOSIS — R262 Difficulty in walking, not elsewhere classified: Secondary | ICD-10-CM | POA: Diagnosis not present

## 2020-02-27 DIAGNOSIS — Z1329 Encounter for screening for other suspected endocrine disorder: Secondary | ICD-10-CM | POA: Diagnosis not present

## 2020-02-27 DIAGNOSIS — Z472 Encounter for removal of internal fixation device: Secondary | ICD-10-CM | POA: Diagnosis not present

## 2020-02-27 DIAGNOSIS — S22060A Wedge compression fracture of T7-T8 vertebra, initial encounter for closed fracture: Secondary | ICD-10-CM | POA: Diagnosis not present

## 2020-02-27 DIAGNOSIS — S52602A Unspecified fracture of lower end of left ulna, initial encounter for closed fracture: Secondary | ICD-10-CM | POA: Diagnosis not present

## 2020-02-27 DIAGNOSIS — I2510 Atherosclerotic heart disease of native coronary artery without angina pectoris: Secondary | ICD-10-CM | POA: Diagnosis not present

## 2020-02-27 DIAGNOSIS — Z885 Allergy status to narcotic agent status: Secondary | ICD-10-CM | POA: Diagnosis not present

## 2020-02-27 DIAGNOSIS — M48061 Spinal stenosis, lumbar region without neurogenic claudication: Secondary | ICD-10-CM | POA: Diagnosis not present

## 2020-02-27 DIAGNOSIS — Z85828 Personal history of other malignant neoplasm of skin: Secondary | ICD-10-CM | POA: Diagnosis not present

## 2020-02-27 DIAGNOSIS — Z66 Do not resuscitate: Secondary | ICD-10-CM | POA: Diagnosis not present

## 2020-02-27 DIAGNOSIS — S0993XA Unspecified injury of face, initial encounter: Secondary | ICD-10-CM | POA: Diagnosis not present

## 2020-02-27 DIAGNOSIS — Z720 Tobacco use: Secondary | ICD-10-CM | POA: Diagnosis not present

## 2020-02-27 DIAGNOSIS — R5381 Other malaise: Secondary | ICD-10-CM | POA: Diagnosis not present

## 2020-02-27 DIAGNOSIS — Z13220 Encounter for screening for lipoid disorders: Secondary | ICD-10-CM | POA: Diagnosis not present

## 2020-02-27 DIAGNOSIS — K644 Residual hemorrhoidal skin tags: Secondary | ICD-10-CM | POA: Diagnosis not present

## 2020-02-27 DIAGNOSIS — G4733 Obstructive sleep apnea (adult) (pediatric): Secondary | ICD-10-CM | POA: Diagnosis not present

## 2020-02-27 DIAGNOSIS — R319 Hematuria, unspecified: Secondary | ICD-10-CM | POA: Diagnosis not present

## 2020-02-27 DIAGNOSIS — S80212A Abrasion, left knee, initial encounter: Secondary | ICD-10-CM | POA: Diagnosis not present

## 2020-02-27 DIAGNOSIS — G919 Hydrocephalus, unspecified: Secondary | ICD-10-CM | POA: Diagnosis not present

## 2020-02-27 DIAGNOSIS — J302 Other seasonal allergic rhinitis: Secondary | ICD-10-CM | POA: Diagnosis not present

## 2020-02-27 DIAGNOSIS — F418 Other specified anxiety disorders: Secondary | ICD-10-CM | POA: Diagnosis not present

## 2020-02-27 DIAGNOSIS — S50312A Abrasion of left elbow, initial encounter: Secondary | ICD-10-CM | POA: Diagnosis not present

## 2020-02-27 DIAGNOSIS — I480 Paroxysmal atrial fibrillation: Secondary | ICD-10-CM | POA: Diagnosis not present

## 2020-02-27 DIAGNOSIS — R220 Localized swelling, mass and lump, head: Secondary | ICD-10-CM | POA: Diagnosis not present

## 2020-02-27 DIAGNOSIS — I21A1 Myocardial infarction type 2: Secondary | ICD-10-CM | POA: Diagnosis not present

## 2020-02-27 DIAGNOSIS — R569 Unspecified convulsions: Secondary | ICD-10-CM | POA: Diagnosis not present

## 2020-02-27 DIAGNOSIS — J181 Lobar pneumonia, unspecified organism: Secondary | ICD-10-CM | POA: Diagnosis not present

## 2020-02-27 DIAGNOSIS — K8020 Calculus of gallbladder without cholecystitis without obstruction: Secondary | ICD-10-CM | POA: Diagnosis not present

## 2020-02-27 DIAGNOSIS — G40909 Epilepsy, unspecified, not intractable, without status epilepticus: Secondary | ICD-10-CM | POA: Diagnosis not present

## 2020-02-27 DIAGNOSIS — R64 Cachexia: Secondary | ICD-10-CM | POA: Diagnosis not present

## 2020-02-27 DIAGNOSIS — I482 Chronic atrial fibrillation, unspecified: Secondary | ICD-10-CM | POA: Diagnosis not present

## 2020-02-27 DIAGNOSIS — R000 Tachycardia, unspecified: Secondary | ICD-10-CM | POA: Diagnosis not present

## 2020-02-27 DIAGNOSIS — F0391 Unspecified dementia with behavioral disturbance: Secondary | ICD-10-CM | POA: Diagnosis not present

## 2020-02-27 DIAGNOSIS — I5032 Chronic diastolic (congestive) heart failure: Secondary | ICD-10-CM | POA: Diagnosis not present

## 2020-02-27 DIAGNOSIS — F419 Anxiety disorder, unspecified: Secondary | ICD-10-CM | POA: Diagnosis not present

## 2020-02-27 DIAGNOSIS — Z7901 Long term (current) use of anticoagulants: Secondary | ICD-10-CM | POA: Diagnosis not present

## 2020-02-27 DIAGNOSIS — E1165 Type 2 diabetes mellitus with hyperglycemia: Secondary | ICD-10-CM | POA: Diagnosis not present

## 2020-02-27 DIAGNOSIS — Z8501 Personal history of malignant neoplasm of esophagus: Secondary | ICD-10-CM | POA: Diagnosis not present

## 2020-02-27 DIAGNOSIS — I4891 Unspecified atrial fibrillation: Secondary | ICD-10-CM | POA: Diagnosis not present

## 2020-02-27 DIAGNOSIS — S22069A Unspecified fracture of T7-T8 vertebra, initial encounter for closed fracture: Secondary | ICD-10-CM | POA: Diagnosis not present

## 2020-02-27 DIAGNOSIS — M810 Age-related osteoporosis without current pathological fracture: Secondary | ICD-10-CM | POA: Diagnosis not present

## 2020-02-27 DIAGNOSIS — R0789 Other chest pain: Secondary | ICD-10-CM | POA: Diagnosis not present

## 2020-02-27 DIAGNOSIS — G936 Cerebral edema: Secondary | ICD-10-CM | POA: Diagnosis not present

## 2020-02-27 DIAGNOSIS — Z135 Encounter for screening for eye and ear disorders: Secondary | ICD-10-CM | POA: Diagnosis not present

## 2020-02-27 DIAGNOSIS — S52022P Displaced fracture of olecranon process without intraarticular extension of left ulna, subsequent encounter for closed fracture with malunion: Secondary | ICD-10-CM | POA: Diagnosis not present

## 2020-02-27 DIAGNOSIS — I6522 Occlusion and stenosis of left carotid artery: Secondary | ICD-10-CM | POA: Diagnosis not present

## 2020-02-27 DIAGNOSIS — S52209A Unspecified fracture of shaft of unspecified ulna, initial encounter for closed fracture: Secondary | ICD-10-CM | POA: Diagnosis not present

## 2020-02-27 DIAGNOSIS — L89153 Pressure ulcer of sacral region, stage 3: Secondary | ICD-10-CM | POA: Diagnosis not present

## 2020-02-27 DIAGNOSIS — B951 Streptococcus, group B, as the cause of diseases classified elsewhere: Secondary | ICD-10-CM | POA: Diagnosis not present

## 2020-02-27 DIAGNOSIS — R0902 Hypoxemia: Secondary | ICD-10-CM | POA: Diagnosis not present

## 2020-02-27 DIAGNOSIS — Z952 Presence of prosthetic heart valve: Secondary | ICD-10-CM | POA: Diagnosis not present

## 2020-02-27 DIAGNOSIS — R05 Cough: Secondary | ICD-10-CM | POA: Diagnosis not present

## 2020-02-27 DIAGNOSIS — T827XXA Infection and inflammatory reaction due to other cardiac and vascular devices, implants and grafts, initial encounter: Secondary | ICD-10-CM | POA: Diagnosis not present

## 2020-02-27 DIAGNOSIS — E039 Hypothyroidism, unspecified: Secondary | ICD-10-CM | POA: Diagnosis not present

## 2020-02-27 DIAGNOSIS — J9621 Acute and chronic respiratory failure with hypoxia: Secondary | ICD-10-CM | POA: Diagnosis not present

## 2020-02-27 DIAGNOSIS — B0221 Postherpetic geniculate ganglionitis: Secondary | ICD-10-CM | POA: Diagnosis not present

## 2020-02-27 DIAGNOSIS — R946 Abnormal results of thyroid function studies: Secondary | ICD-10-CM | POA: Diagnosis not present

## 2020-02-27 DIAGNOSIS — M799 Soft tissue disorder, unspecified: Secondary | ICD-10-CM | POA: Diagnosis not present

## 2020-02-27 DIAGNOSIS — N390 Urinary tract infection, site not specified: Secondary | ICD-10-CM | POA: Diagnosis not present

## 2020-02-27 DIAGNOSIS — Z951 Presence of aortocoronary bypass graft: Secondary | ICD-10-CM | POA: Diagnosis not present

## 2020-02-27 DIAGNOSIS — J9601 Acute respiratory failure with hypoxia: Secondary | ICD-10-CM | POA: Diagnosis not present

## 2020-02-27 DIAGNOSIS — E1140 Type 2 diabetes mellitus with diabetic neuropathy, unspecified: Secondary | ICD-10-CM | POA: Diagnosis not present

## 2020-02-27 DIAGNOSIS — G459 Transient cerebral ischemic attack, unspecified: Secondary | ICD-10-CM | POA: Diagnosis not present

## 2020-02-27 DIAGNOSIS — E119 Type 2 diabetes mellitus without complications: Secondary | ICD-10-CM | POA: Diagnosis not present

## 2020-02-27 DIAGNOSIS — S90812A Abrasion, left foot, initial encounter: Secondary | ICD-10-CM | POA: Diagnosis not present

## 2020-02-27 DIAGNOSIS — R54 Age-related physical debility: Secondary | ICD-10-CM | POA: Diagnosis not present

## 2020-02-27 DIAGNOSIS — Z62821 Parent-adopted child conflict: Secondary | ICD-10-CM | POA: Diagnosis not present

## 2020-02-27 DIAGNOSIS — R4020 Unspecified coma: Secondary | ICD-10-CM | POA: Diagnosis not present

## 2020-02-27 DIAGNOSIS — R29810 Facial weakness: Secondary | ICD-10-CM | POA: Diagnosis not present

## 2020-02-27 DIAGNOSIS — I428 Other cardiomyopathies: Secondary | ICD-10-CM | POA: Diagnosis not present

## 2020-02-27 DIAGNOSIS — M329 Systemic lupus erythematosus, unspecified: Secondary | ICD-10-CM | POA: Diagnosis not present

## 2020-02-27 DIAGNOSIS — Z79899 Other long term (current) drug therapy: Secondary | ICD-10-CM | POA: Diagnosis not present

## 2020-02-27 DIAGNOSIS — Z794 Long term (current) use of insulin: Secondary | ICD-10-CM | POA: Diagnosis not present

## 2020-02-27 DIAGNOSIS — J15212 Pneumonia due to Methicillin resistant Staphylococcus aureus: Secondary | ICD-10-CM | POA: Diagnosis not present

## 2020-02-27 DIAGNOSIS — G20 Parkinson's disease: Secondary | ICD-10-CM | POA: Diagnosis not present

## 2020-02-27 DIAGNOSIS — G894 Chronic pain syndrome: Secondary | ICD-10-CM | POA: Diagnosis not present

## 2020-02-27 DIAGNOSIS — I160 Hypertensive urgency: Secondary | ICD-10-CM | POA: Diagnosis not present

## 2020-02-27 DIAGNOSIS — J439 Emphysema, unspecified: Secondary | ICD-10-CM | POA: Diagnosis not present

## 2020-02-27 DIAGNOSIS — K921 Melena: Secondary | ICD-10-CM | POA: Diagnosis not present

## 2020-02-27 DIAGNOSIS — M6282 Rhabdomyolysis: Secondary | ICD-10-CM | POA: Diagnosis not present

## 2020-02-27 DIAGNOSIS — R296 Repeated falls: Secondary | ICD-10-CM | POA: Diagnosis not present

## 2020-02-27 DIAGNOSIS — I6782 Cerebral ischemia: Secondary | ICD-10-CM | POA: Diagnosis not present

## 2020-02-27 DIAGNOSIS — F1290 Cannabis use, unspecified, uncomplicated: Secondary | ICD-10-CM | POA: Diagnosis not present

## 2020-02-27 DIAGNOSIS — I672 Cerebral atherosclerosis: Secondary | ICD-10-CM | POA: Diagnosis not present

## 2020-02-27 DIAGNOSIS — D539 Nutritional anemia, unspecified: Secondary | ICD-10-CM | POA: Diagnosis not present

## 2020-02-27 DIAGNOSIS — J9611 Chronic respiratory failure with hypoxia: Secondary | ICD-10-CM | POA: Diagnosis not present

## 2020-02-27 DIAGNOSIS — L89616 Pressure-induced deep tissue damage of right heel: Secondary | ICD-10-CM | POA: Diagnosis not present

## 2020-02-27 DIAGNOSIS — J984 Other disorders of lung: Secondary | ICD-10-CM | POA: Diagnosis not present

## 2020-02-27 DIAGNOSIS — Z8249 Family history of ischemic heart disease and other diseases of the circulatory system: Secondary | ICD-10-CM | POA: Diagnosis not present

## 2020-02-27 DIAGNOSIS — R0603 Acute respiratory distress: Secondary | ICD-10-CM | POA: Diagnosis not present

## 2020-02-27 DIAGNOSIS — S199XXA Unspecified injury of neck, initial encounter: Secondary | ICD-10-CM | POA: Diagnosis not present

## 2020-02-27 DIAGNOSIS — S13120D Subluxation of C1/C2 cervical vertebrae, subsequent encounter: Secondary | ICD-10-CM | POA: Diagnosis not present

## 2020-02-27 DIAGNOSIS — G8918 Other acute postprocedural pain: Secondary | ICD-10-CM | POA: Diagnosis not present

## 2020-02-27 DIAGNOSIS — D120 Benign neoplasm of cecum: Secondary | ICD-10-CM | POA: Diagnosis not present

## 2020-02-27 DIAGNOSIS — I959 Hypotension, unspecified: Secondary | ICD-10-CM | POA: Diagnosis not present

## 2020-02-27 DIAGNOSIS — R160 Hepatomegaly, not elsewhere classified: Secondary | ICD-10-CM | POA: Diagnosis not present

## 2020-02-27 DIAGNOSIS — Z87891 Personal history of nicotine dependence: Secondary | ICD-10-CM | POA: Diagnosis not present

## 2020-02-27 DIAGNOSIS — Z5181 Encounter for therapeutic drug level monitoring: Secondary | ICD-10-CM | POA: Diagnosis not present

## 2020-02-27 DIAGNOSIS — A084 Viral intestinal infection, unspecified: Secondary | ICD-10-CM | POA: Diagnosis not present

## 2020-02-27 DIAGNOSIS — K117 Disturbances of salivary secretion: Secondary | ICD-10-CM | POA: Diagnosis not present

## 2020-02-27 DIAGNOSIS — N183 Chronic kidney disease, stage 3 unspecified: Secondary | ICD-10-CM | POA: Diagnosis not present

## 2020-02-27 DIAGNOSIS — R079 Chest pain, unspecified: Secondary | ICD-10-CM | POA: Diagnosis not present

## 2020-02-27 DIAGNOSIS — R519 Headache, unspecified: Secondary | ICD-10-CM | POA: Diagnosis not present

## 2020-02-27 DIAGNOSIS — K5730 Diverticulosis of large intestine without perforation or abscess without bleeding: Secondary | ICD-10-CM | POA: Diagnosis not present

## 2020-02-27 DIAGNOSIS — K5909 Other constipation: Secondary | ICD-10-CM | POA: Diagnosis not present

## 2020-02-27 DIAGNOSIS — Z5321 Procedure and treatment not carried out due to patient leaving prior to being seen by health care provider: Secondary | ICD-10-CM | POA: Diagnosis not present

## 2020-02-27 DIAGNOSIS — T8460XA Infection and inflammatory reaction due to internal fixation device of unspecified site, initial encounter: Secondary | ICD-10-CM | POA: Diagnosis not present

## 2020-02-27 DIAGNOSIS — G2581 Restless legs syndrome: Secondary | ICD-10-CM | POA: Diagnosis not present

## 2020-02-27 DIAGNOSIS — Z954 Presence of other heart-valve replacement: Secondary | ICD-10-CM | POA: Diagnosis not present

## 2020-02-27 DIAGNOSIS — Z712 Person consulting for explanation of examination or test findings: Secondary | ICD-10-CM | POA: Diagnosis not present

## 2020-02-27 DIAGNOSIS — D123 Benign neoplasm of transverse colon: Secondary | ICD-10-CM | POA: Diagnosis not present

## 2020-02-27 DIAGNOSIS — B372 Candidiasis of skin and nail: Secondary | ICD-10-CM | POA: Diagnosis not present

## 2020-02-27 DIAGNOSIS — S129XXA Fracture of neck, unspecified, initial encounter: Secondary | ICD-10-CM | POA: Diagnosis not present

## 2020-02-27 DIAGNOSIS — R93422 Abnormal radiologic findings on diagnostic imaging of left kidney: Secondary | ICD-10-CM | POA: Diagnosis not present

## 2020-02-27 DIAGNOSIS — M549 Dorsalgia, unspecified: Secondary | ICD-10-CM | POA: Diagnosis not present

## 2020-02-27 DIAGNOSIS — Z982 Presence of cerebrospinal fluid drainage device: Secondary | ICD-10-CM | POA: Diagnosis not present

## 2020-02-27 DIAGNOSIS — Z9911 Dependence on respirator [ventilator] status: Secondary | ICD-10-CM | POA: Diagnosis not present

## 2020-02-27 DIAGNOSIS — R531 Weakness: Secondary | ICD-10-CM | POA: Diagnosis not present

## 2020-02-27 DIAGNOSIS — M069 Rheumatoid arthritis, unspecified: Secondary | ICD-10-CM | POA: Diagnosis not present

## 2020-02-27 DIAGNOSIS — Z981 Arthrodesis status: Secondary | ICD-10-CM | POA: Diagnosis not present

## 2020-02-27 DIAGNOSIS — E1169 Type 2 diabetes mellitus with other specified complication: Secondary | ICD-10-CM | POA: Diagnosis not present

## 2020-02-27 DIAGNOSIS — J440 Chronic obstructive pulmonary disease with acute lower respiratory infection: Secondary | ICD-10-CM | POA: Diagnosis not present

## 2020-02-27 DIAGNOSIS — M1990 Unspecified osteoarthritis, unspecified site: Secondary | ICD-10-CM | POA: Diagnosis not present

## 2020-02-27 DIAGNOSIS — E43 Unspecified severe protein-calorie malnutrition: Secondary | ICD-10-CM | POA: Diagnosis not present

## 2020-02-27 DIAGNOSIS — Z515 Encounter for palliative care: Secondary | ICD-10-CM | POA: Diagnosis not present

## 2020-02-27 DIAGNOSIS — J90 Pleural effusion, not elsewhere classified: Secondary | ICD-10-CM | POA: Diagnosis not present

## 2020-02-27 DIAGNOSIS — Z9861 Coronary angioplasty status: Secondary | ICD-10-CM | POA: Diagnosis not present

## 2020-02-27 DIAGNOSIS — F23 Brief psychotic disorder: Secondary | ICD-10-CM | POA: Diagnosis not present

## 2020-02-27 DIAGNOSIS — E785 Hyperlipidemia, unspecified: Secondary | ICD-10-CM | POA: Diagnosis not present

## 2020-02-27 DIAGNOSIS — S52022A Displaced fracture of olecranon process without intraarticular extension of left ulna, initial encounter for closed fracture: Secondary | ICD-10-CM | POA: Diagnosis not present

## 2020-02-27 DIAGNOSIS — I5020 Unspecified systolic (congestive) heart failure: Secondary | ICD-10-CM | POA: Diagnosis not present

## 2020-02-27 DIAGNOSIS — K118 Other diseases of salivary glands: Secondary | ICD-10-CM | POA: Diagnosis not present

## 2020-02-27 DIAGNOSIS — R4182 Altered mental status, unspecified: Secondary | ICD-10-CM | POA: Diagnosis not present

## 2020-02-27 DIAGNOSIS — G8929 Other chronic pain: Secondary | ICD-10-CM | POA: Diagnosis not present

## 2020-02-27 DIAGNOSIS — Z0389 Encounter for observation for other suspected diseases and conditions ruled out: Secondary | ICD-10-CM | POA: Diagnosis not present

## 2020-02-27 DIAGNOSIS — R401 Stupor: Secondary | ICD-10-CM | POA: Diagnosis not present

## 2020-02-27 DIAGNOSIS — J441 Chronic obstructive pulmonary disease with (acute) exacerbation: Secondary | ICD-10-CM | POA: Diagnosis not present

## 2020-02-27 DIAGNOSIS — S52601A Unspecified fracture of lower end of right ulna, initial encounter for closed fracture: Secondary | ICD-10-CM | POA: Diagnosis not present

## 2020-02-27 DIAGNOSIS — W19XXXA Unspecified fall, initial encounter: Secondary | ICD-10-CM | POA: Diagnosis not present

## 2020-02-27 DIAGNOSIS — A419 Sepsis, unspecified organism: Secondary | ICD-10-CM | POA: Diagnosis not present

## 2020-02-27 DIAGNOSIS — E559 Vitamin D deficiency, unspecified: Secondary | ICD-10-CM | POA: Diagnosis not present

## 2020-02-28 DIAGNOSIS — R011 Cardiac murmur, unspecified: Secondary | ICD-10-CM | POA: Diagnosis not present

## 2020-02-28 DIAGNOSIS — D0472 Carcinoma in situ of skin of left lower limb, including hip: Secondary | ICD-10-CM | POA: Diagnosis not present

## 2020-02-28 DIAGNOSIS — N289 Disorder of kidney and ureter, unspecified: Secondary | ICD-10-CM | POA: Diagnosis not present

## 2020-02-28 DIAGNOSIS — R921 Mammographic calcification found on diagnostic imaging of breast: Secondary | ICD-10-CM | POA: Diagnosis not present

## 2020-02-28 DIAGNOSIS — H43812 Vitreous degeneration, left eye: Secondary | ICD-10-CM | POA: Diagnosis not present

## 2020-02-28 DIAGNOSIS — I255 Ischemic cardiomyopathy: Secondary | ICD-10-CM | POA: Diagnosis not present

## 2020-02-28 DIAGNOSIS — Z8719 Personal history of other diseases of the digestive system: Secondary | ICD-10-CM | POA: Diagnosis not present

## 2020-02-28 DIAGNOSIS — E291 Testicular hypofunction: Secondary | ICD-10-CM | POA: Diagnosis not present

## 2020-02-28 DIAGNOSIS — M5432 Sciatica, left side: Secondary | ICD-10-CM | POA: Diagnosis not present

## 2020-02-28 DIAGNOSIS — U071 COVID-19: Secondary | ICD-10-CM | POA: Diagnosis not present

## 2020-02-28 DIAGNOSIS — L578 Other skin changes due to chronic exposure to nonionizing radiation: Secondary | ICD-10-CM | POA: Diagnosis not present

## 2020-02-28 DIAGNOSIS — M179 Osteoarthritis of knee, unspecified: Secondary | ICD-10-CM | POA: Diagnosis not present

## 2020-02-28 DIAGNOSIS — F39 Unspecified mood [affective] disorder: Secondary | ICD-10-CM | POA: Diagnosis not present

## 2020-02-28 DIAGNOSIS — Y93K9 Activity, other involving animal care: Secondary | ICD-10-CM | POA: Diagnosis not present

## 2020-02-28 DIAGNOSIS — M79641 Pain in right hand: Secondary | ICD-10-CM | POA: Diagnosis not present

## 2020-02-28 DIAGNOSIS — I6521 Occlusion and stenosis of right carotid artery: Secondary | ICD-10-CM | POA: Diagnosis not present

## 2020-02-28 DIAGNOSIS — E1159 Type 2 diabetes mellitus with other circulatory complications: Secondary | ICD-10-CM | POA: Diagnosis not present

## 2020-02-28 DIAGNOSIS — I82621 Acute embolism and thrombosis of deep veins of right upper extremity: Secondary | ICD-10-CM | POA: Diagnosis not present

## 2020-02-28 DIAGNOSIS — A419 Sepsis, unspecified organism: Secondary | ICD-10-CM | POA: Diagnosis not present

## 2020-02-28 DIAGNOSIS — Z833 Family history of diabetes mellitus: Secondary | ICD-10-CM | POA: Diagnosis not present

## 2020-02-28 DIAGNOSIS — E1136 Type 2 diabetes mellitus with diabetic cataract: Secondary | ICD-10-CM | POA: Diagnosis not present

## 2020-02-28 DIAGNOSIS — D649 Anemia, unspecified: Secondary | ICD-10-CM | POA: Diagnosis not present

## 2020-02-28 DIAGNOSIS — M5033 Other cervical disc degeneration, cervicothoracic region: Secondary | ICD-10-CM | POA: Diagnosis not present

## 2020-02-28 DIAGNOSIS — I82611 Acute embolism and thrombosis of superficial veins of right upper extremity: Secondary | ICD-10-CM | POA: Diagnosis not present

## 2020-02-28 DIAGNOSIS — H401131 Primary open-angle glaucoma, bilateral, mild stage: Secondary | ICD-10-CM | POA: Diagnosis not present

## 2020-02-28 DIAGNOSIS — H8109 Meniere's disease, unspecified ear: Secondary | ICD-10-CM | POA: Diagnosis not present

## 2020-02-28 DIAGNOSIS — S81801A Unspecified open wound, right lower leg, initial encounter: Secondary | ICD-10-CM | POA: Diagnosis not present

## 2020-02-28 DIAGNOSIS — Z6832 Body mass index (BMI) 32.0-32.9, adult: Secondary | ICD-10-CM | POA: Diagnosis not present

## 2020-02-28 DIAGNOSIS — G252 Other specified forms of tremor: Secondary | ICD-10-CM | POA: Diagnosis not present

## 2020-02-28 DIAGNOSIS — Z954 Presence of other heart-valve replacement: Secondary | ICD-10-CM | POA: Diagnosis not present

## 2020-02-28 DIAGNOSIS — N320 Bladder-neck obstruction: Secondary | ICD-10-CM | POA: Diagnosis not present

## 2020-02-28 DIAGNOSIS — M4807 Spinal stenosis, lumbosacral region: Secondary | ICD-10-CM | POA: Diagnosis not present

## 2020-02-28 DIAGNOSIS — W19XXXA Unspecified fall, initial encounter: Secondary | ICD-10-CM | POA: Diagnosis not present

## 2020-02-28 DIAGNOSIS — C672 Malignant neoplasm of lateral wall of bladder: Secondary | ICD-10-CM | POA: Diagnosis not present

## 2020-02-28 DIAGNOSIS — M65342 Trigger finger, left ring finger: Secondary | ICD-10-CM | POA: Diagnosis not present

## 2020-02-28 DIAGNOSIS — E7800 Pure hypercholesterolemia, unspecified: Secondary | ICD-10-CM | POA: Diagnosis not present

## 2020-02-28 DIAGNOSIS — S40021A Contusion of right upper arm, initial encounter: Secondary | ICD-10-CM | POA: Diagnosis not present

## 2020-02-28 DIAGNOSIS — E079 Disorder of thyroid, unspecified: Secondary | ICD-10-CM | POA: Diagnosis not present

## 2020-02-28 DIAGNOSIS — M109 Gout, unspecified: Secondary | ICD-10-CM | POA: Diagnosis not present

## 2020-02-28 DIAGNOSIS — I270 Primary pulmonary hypertension: Secondary | ICD-10-CM | POA: Diagnosis not present

## 2020-02-28 DIAGNOSIS — D124 Benign neoplasm of descending colon: Secondary | ICD-10-CM | POA: Diagnosis not present

## 2020-02-28 DIAGNOSIS — E7849 Other hyperlipidemia: Secondary | ICD-10-CM | POA: Diagnosis not present

## 2020-02-28 DIAGNOSIS — B351 Tinea unguium: Secondary | ICD-10-CM | POA: Diagnosis not present

## 2020-02-28 DIAGNOSIS — L03113 Cellulitis of right upper limb: Secondary | ICD-10-CM | POA: Diagnosis not present

## 2020-02-28 DIAGNOSIS — M8000XD Age-related osteoporosis with current pathological fracture, unspecified site, subsequent encounter for fracture with routine healing: Secondary | ICD-10-CM | POA: Diagnosis not present

## 2020-02-28 DIAGNOSIS — N132 Hydronephrosis with renal and ureteral calculous obstruction: Secondary | ICD-10-CM | POA: Diagnosis not present

## 2020-02-28 DIAGNOSIS — Z08 Encounter for follow-up examination after completed treatment for malignant neoplasm: Secondary | ICD-10-CM | POA: Diagnosis not present

## 2020-02-28 DIAGNOSIS — Z760 Encounter for issue of repeat prescription: Secondary | ICD-10-CM | POA: Diagnosis not present

## 2020-02-28 DIAGNOSIS — L858 Other specified epidermal thickening: Secondary | ICD-10-CM | POA: Diagnosis not present

## 2020-02-28 DIAGNOSIS — Z8261 Family history of arthritis: Secondary | ICD-10-CM | POA: Diagnosis not present

## 2020-02-28 DIAGNOSIS — L97919 Non-pressure chronic ulcer of unspecified part of right lower leg with unspecified severity: Secondary | ICD-10-CM | POA: Diagnosis not present

## 2020-02-28 DIAGNOSIS — H60501 Unspecified acute noninfective otitis externa, right ear: Secondary | ICD-10-CM | POA: Diagnosis not present

## 2020-02-28 DIAGNOSIS — M549 Dorsalgia, unspecified: Secondary | ICD-10-CM | POA: Diagnosis not present

## 2020-02-28 DIAGNOSIS — C9110 Chronic lymphocytic leukemia of B-cell type not having achieved remission: Secondary | ICD-10-CM | POA: Diagnosis not present

## 2020-02-28 DIAGNOSIS — E113511 Type 2 diabetes mellitus with proliferative diabetic retinopathy with macular edema, right eye: Secondary | ICD-10-CM | POA: Diagnosis not present

## 2020-02-28 DIAGNOSIS — B9620 Unspecified Escherichia coli [E. coli] as the cause of diseases classified elsewhere: Secondary | ICD-10-CM | POA: Diagnosis not present

## 2020-02-28 DIAGNOSIS — R627 Adult failure to thrive: Secondary | ICD-10-CM | POA: Diagnosis not present

## 2020-02-28 DIAGNOSIS — R6510 Systemic inflammatory response syndrome (SIRS) of non-infectious origin without acute organ dysfunction: Secondary | ICD-10-CM | POA: Diagnosis not present

## 2020-02-28 DIAGNOSIS — I712 Thoracic aortic aneurysm, without rupture: Secondary | ICD-10-CM | POA: Diagnosis not present

## 2020-02-28 DIAGNOSIS — F324 Major depressive disorder, single episode, in partial remission: Secondary | ICD-10-CM | POA: Diagnosis not present

## 2020-02-28 DIAGNOSIS — Z8546 Personal history of malignant neoplasm of prostate: Secondary | ICD-10-CM | POA: Diagnosis not present

## 2020-02-28 DIAGNOSIS — M50322 Other cervical disc degeneration at C5-C6 level: Secondary | ICD-10-CM | POA: Diagnosis not present

## 2020-02-28 DIAGNOSIS — R9431 Abnormal electrocardiogram [ECG] [EKG]: Secondary | ICD-10-CM | POA: Diagnosis not present

## 2020-02-28 DIAGNOSIS — E673 Hypervitaminosis D: Secondary | ICD-10-CM | POA: Diagnosis not present

## 2020-02-28 DIAGNOSIS — C689 Malignant neoplasm of urinary organ, unspecified: Secondary | ICD-10-CM | POA: Diagnosis not present

## 2020-02-28 DIAGNOSIS — E1040 Type 1 diabetes mellitus with diabetic neuropathy, unspecified: Secondary | ICD-10-CM | POA: Diagnosis not present

## 2020-02-28 DIAGNOSIS — C9142 Hairy cell leukemia, in relapse: Secondary | ICD-10-CM | POA: Diagnosis not present

## 2020-02-28 DIAGNOSIS — F3340 Major depressive disorder, recurrent, in remission, unspecified: Secondary | ICD-10-CM | POA: Diagnosis not present

## 2020-02-28 DIAGNOSIS — J45909 Unspecified asthma, uncomplicated: Secondary | ICD-10-CM | POA: Diagnosis not present

## 2020-02-28 DIAGNOSIS — I080 Rheumatic disorders of both mitral and aortic valves: Secondary | ICD-10-CM | POA: Diagnosis not present

## 2020-02-28 DIAGNOSIS — I779 Disorder of arteries and arterioles, unspecified: Secondary | ICD-10-CM | POA: Diagnosis not present

## 2020-02-28 DIAGNOSIS — C778 Secondary and unspecified malignant neoplasm of lymph nodes of multiple regions: Secondary | ICD-10-CM | POA: Diagnosis not present

## 2020-02-28 DIAGNOSIS — Z1331 Encounter for screening for depression: Secondary | ICD-10-CM | POA: Diagnosis not present

## 2020-02-28 DIAGNOSIS — R42 Dizziness and giddiness: Secondary | ICD-10-CM | POA: Diagnosis not present

## 2020-02-28 DIAGNOSIS — G3101 Pick's disease: Secondary | ICD-10-CM | POA: Diagnosis not present

## 2020-02-28 DIAGNOSIS — B070 Plantar wart: Secondary | ICD-10-CM | POA: Diagnosis not present

## 2020-02-28 DIAGNOSIS — D2262 Melanocytic nevi of left upper limb, including shoulder: Secondary | ICD-10-CM | POA: Diagnosis not present

## 2020-02-28 DIAGNOSIS — I499 Cardiac arrhythmia, unspecified: Secondary | ICD-10-CM | POA: Diagnosis not present

## 2020-02-28 DIAGNOSIS — H31001 Unspecified chorioretinal scars, right eye: Secondary | ICD-10-CM | POA: Diagnosis not present

## 2020-02-28 DIAGNOSIS — M25512 Pain in left shoulder: Secondary | ICD-10-CM | POA: Diagnosis not present

## 2020-02-28 DIAGNOSIS — F458 Other somatoform disorders: Secondary | ICD-10-CM | POA: Diagnosis not present

## 2020-02-28 DIAGNOSIS — G801 Spastic diplegic cerebral palsy: Secondary | ICD-10-CM | POA: Diagnosis not present

## 2020-02-28 DIAGNOSIS — R072 Precordial pain: Secondary | ICD-10-CM | POA: Diagnosis not present

## 2020-02-28 DIAGNOSIS — K580 Irritable bowel syndrome with diarrhea: Secondary | ICD-10-CM | POA: Diagnosis not present

## 2020-02-28 DIAGNOSIS — K449 Diaphragmatic hernia without obstruction or gangrene: Secondary | ICD-10-CM | POA: Diagnosis not present

## 2020-02-28 DIAGNOSIS — I5043 Acute on chronic combined systolic (congestive) and diastolic (congestive) heart failure: Secondary | ICD-10-CM | POA: Diagnosis not present

## 2020-02-28 DIAGNOSIS — E669 Obesity, unspecified: Secondary | ICD-10-CM | POA: Diagnosis not present

## 2020-02-28 DIAGNOSIS — R630 Anorexia: Secondary | ICD-10-CM | POA: Diagnosis not present

## 2020-02-28 DIAGNOSIS — I110 Hypertensive heart disease with heart failure: Secondary | ICD-10-CM | POA: Diagnosis not present

## 2020-02-28 DIAGNOSIS — G44309 Post-traumatic headache, unspecified, not intractable: Secondary | ICD-10-CM | POA: Diagnosis not present

## 2020-02-28 DIAGNOSIS — E1029 Type 1 diabetes mellitus with other diabetic kidney complication: Secondary | ICD-10-CM | POA: Diagnosis not present

## 2020-02-28 DIAGNOSIS — H02423 Myogenic ptosis of bilateral eyelids: Secondary | ICD-10-CM | POA: Diagnosis not present

## 2020-02-28 DIAGNOSIS — M2241 Chondromalacia patellae, right knee: Secondary | ICD-10-CM | POA: Diagnosis not present

## 2020-02-28 DIAGNOSIS — S60413A Abrasion of left middle finger, initial encounter: Secondary | ICD-10-CM | POA: Diagnosis not present

## 2020-02-28 DIAGNOSIS — K922 Gastrointestinal hemorrhage, unspecified: Secondary | ICD-10-CM | POA: Diagnosis not present

## 2020-02-28 DIAGNOSIS — M5030 Other cervical disc degeneration, unspecified cervical region: Secondary | ICD-10-CM | POA: Diagnosis not present

## 2020-02-28 DIAGNOSIS — I2699 Other pulmonary embolism without acute cor pulmonale: Secondary | ICD-10-CM | POA: Diagnosis not present

## 2020-02-28 DIAGNOSIS — M4126 Other idiopathic scoliosis, lumbar region: Secondary | ICD-10-CM | POA: Diagnosis not present

## 2020-02-28 DIAGNOSIS — K7689 Other specified diseases of liver: Secondary | ICD-10-CM | POA: Diagnosis not present

## 2020-02-28 DIAGNOSIS — M3500 Sicca syndrome, unspecified: Secondary | ICD-10-CM | POA: Diagnosis not present

## 2020-02-28 DIAGNOSIS — Z1211 Encounter for screening for malignant neoplasm of colon: Secondary | ICD-10-CM | POA: Diagnosis not present

## 2020-02-28 DIAGNOSIS — L97909 Non-pressure chronic ulcer of unspecified part of unspecified lower leg with unspecified severity: Secondary | ICD-10-CM | POA: Diagnosis not present

## 2020-02-28 DIAGNOSIS — Z1239 Encounter for other screening for malignant neoplasm of breast: Secondary | ICD-10-CM | POA: Diagnosis not present

## 2020-02-28 DIAGNOSIS — R194 Change in bowel habit: Secondary | ICD-10-CM | POA: Diagnosis not present

## 2020-02-28 DIAGNOSIS — M79622 Pain in left upper arm: Secondary | ICD-10-CM | POA: Diagnosis not present

## 2020-02-28 DIAGNOSIS — Z9989 Dependence on other enabling machines and devices: Secondary | ICD-10-CM | POA: Diagnosis not present

## 2020-02-28 DIAGNOSIS — R040 Epistaxis: Secondary | ICD-10-CM | POA: Diagnosis not present

## 2020-02-28 DIAGNOSIS — M19049 Primary osteoarthritis, unspecified hand: Secondary | ICD-10-CM | POA: Diagnosis not present

## 2020-02-28 DIAGNOSIS — M85851 Other specified disorders of bone density and structure, right thigh: Secondary | ICD-10-CM | POA: Diagnosis not present

## 2020-02-28 DIAGNOSIS — K858 Other acute pancreatitis without necrosis or infection: Secondary | ICD-10-CM | POA: Diagnosis not present

## 2020-02-28 DIAGNOSIS — G894 Chronic pain syndrome: Secondary | ICD-10-CM | POA: Diagnosis not present

## 2020-02-28 DIAGNOSIS — E785 Hyperlipidemia, unspecified: Secondary | ICD-10-CM | POA: Diagnosis not present

## 2020-02-28 DIAGNOSIS — W07XXXA Fall from chair, initial encounter: Secondary | ICD-10-CM | POA: Diagnosis not present

## 2020-02-28 DIAGNOSIS — F1210 Cannabis abuse, uncomplicated: Secondary | ICD-10-CM | POA: Diagnosis not present

## 2020-02-28 DIAGNOSIS — D473 Essential (hemorrhagic) thrombocythemia: Secondary | ICD-10-CM | POA: Diagnosis not present

## 2020-02-28 DIAGNOSIS — F411 Generalized anxiety disorder: Secondary | ICD-10-CM | POA: Diagnosis not present

## 2020-02-28 DIAGNOSIS — I480 Paroxysmal atrial fibrillation: Secondary | ICD-10-CM | POA: Diagnosis not present

## 2020-02-28 DIAGNOSIS — J84112 Idiopathic pulmonary fibrosis: Secondary | ICD-10-CM | POA: Diagnosis not present

## 2020-02-28 DIAGNOSIS — H02831 Dermatochalasis of right upper eyelid: Secondary | ICD-10-CM | POA: Diagnosis not present

## 2020-02-28 DIAGNOSIS — R200 Anesthesia of skin: Secondary | ICD-10-CM | POA: Diagnosis not present

## 2020-02-28 DIAGNOSIS — R232 Flushing: Secondary | ICD-10-CM | POA: Diagnosis not present

## 2020-02-28 DIAGNOSIS — S43432A Superior glenoid labrum lesion of left shoulder, initial encounter: Secondary | ICD-10-CM | POA: Diagnosis not present

## 2020-02-28 DIAGNOSIS — H18523 Epithelial (juvenile) corneal dystrophy, bilateral: Secondary | ICD-10-CM | POA: Diagnosis not present

## 2020-02-28 DIAGNOSIS — N393 Stress incontinence (female) (male): Secondary | ICD-10-CM | POA: Diagnosis not present

## 2020-02-28 DIAGNOSIS — Z888 Allergy status to other drugs, medicaments and biological substances status: Secondary | ICD-10-CM | POA: Diagnosis not present

## 2020-02-28 DIAGNOSIS — Z87448 Personal history of other diseases of urinary system: Secondary | ICD-10-CM | POA: Diagnosis not present

## 2020-02-28 DIAGNOSIS — H401122 Primary open-angle glaucoma, left eye, moderate stage: Secondary | ICD-10-CM | POA: Diagnosis not present

## 2020-02-28 DIAGNOSIS — R0602 Shortness of breath: Secondary | ICD-10-CM | POA: Diagnosis not present

## 2020-02-28 DIAGNOSIS — H269 Unspecified cataract: Secondary | ICD-10-CM | POA: Diagnosis not present

## 2020-02-28 DIAGNOSIS — L89152 Pressure ulcer of sacral region, stage 2: Secondary | ICD-10-CM | POA: Diagnosis not present

## 2020-02-28 DIAGNOSIS — M7631 Iliotibial band syndrome, right leg: Secondary | ICD-10-CM | POA: Diagnosis not present

## 2020-02-28 DIAGNOSIS — J329 Chronic sinusitis, unspecified: Secondary | ICD-10-CM | POA: Diagnosis not present

## 2020-02-28 DIAGNOSIS — M5414 Radiculopathy, thoracic region: Secondary | ICD-10-CM | POA: Diagnosis not present

## 2020-02-28 DIAGNOSIS — R8290 Unspecified abnormal findings in urine: Secondary | ICD-10-CM | POA: Diagnosis not present

## 2020-02-28 DIAGNOSIS — M1611 Unilateral primary osteoarthritis, right hip: Secondary | ICD-10-CM | POA: Diagnosis not present

## 2020-02-28 DIAGNOSIS — D6489 Other specified anemias: Secondary | ICD-10-CM | POA: Diagnosis not present

## 2020-02-28 DIAGNOSIS — I671 Cerebral aneurysm, nonruptured: Secondary | ICD-10-CM | POA: Diagnosis not present

## 2020-02-28 DIAGNOSIS — R972 Elevated prostate specific antigen [PSA]: Secondary | ICD-10-CM | POA: Diagnosis not present

## 2020-02-28 DIAGNOSIS — D251 Intramural leiomyoma of uterus: Secondary | ICD-10-CM | POA: Diagnosis not present

## 2020-02-28 DIAGNOSIS — I4510 Unspecified right bundle-branch block: Secondary | ICD-10-CM | POA: Diagnosis not present

## 2020-02-28 DIAGNOSIS — E1151 Type 2 diabetes mellitus with diabetic peripheral angiopathy without gangrene: Secondary | ICD-10-CM | POA: Diagnosis not present

## 2020-02-28 DIAGNOSIS — R079 Chest pain, unspecified: Secondary | ICD-10-CM | POA: Diagnosis not present

## 2020-02-28 DIAGNOSIS — F1020 Alcohol dependence, uncomplicated: Secondary | ICD-10-CM | POA: Diagnosis not present

## 2020-02-28 DIAGNOSIS — N368 Other specified disorders of urethra: Secondary | ICD-10-CM | POA: Diagnosis not present

## 2020-02-28 DIAGNOSIS — D122 Benign neoplasm of ascending colon: Secondary | ICD-10-CM | POA: Diagnosis not present

## 2020-02-28 DIAGNOSIS — M25652 Stiffness of left hip, not elsewhere classified: Secondary | ICD-10-CM | POA: Diagnosis not present

## 2020-02-28 DIAGNOSIS — H25811 Combined forms of age-related cataract, right eye: Secondary | ICD-10-CM | POA: Diagnosis not present

## 2020-02-28 DIAGNOSIS — M4800 Spinal stenosis, site unspecified: Secondary | ICD-10-CM | POA: Diagnosis not present

## 2020-02-28 DIAGNOSIS — H938X1 Other specified disorders of right ear: Secondary | ICD-10-CM | POA: Diagnosis not present

## 2020-02-28 DIAGNOSIS — Z13220 Encounter for screening for lipoid disorders: Secondary | ICD-10-CM | POA: Diagnosis not present

## 2020-02-28 DIAGNOSIS — S92344A Nondisplaced fracture of fourth metatarsal bone, right foot, initial encounter for closed fracture: Secondary | ICD-10-CM | POA: Diagnosis not present

## 2020-02-28 DIAGNOSIS — Z85850 Personal history of malignant neoplasm of thyroid: Secondary | ICD-10-CM | POA: Diagnosis not present

## 2020-02-28 DIAGNOSIS — F1011 Alcohol abuse, in remission: Secondary | ICD-10-CM | POA: Diagnosis not present

## 2020-02-28 DIAGNOSIS — R6884 Jaw pain: Secondary | ICD-10-CM | POA: Diagnosis not present

## 2020-02-28 DIAGNOSIS — Z953 Presence of xenogenic heart valve: Secondary | ICD-10-CM | POA: Diagnosis not present

## 2020-02-28 DIAGNOSIS — G8929 Other chronic pain: Secondary | ICD-10-CM | POA: Diagnosis not present

## 2020-02-28 DIAGNOSIS — C3411 Malignant neoplasm of upper lobe, right bronchus or lung: Secondary | ICD-10-CM | POA: Diagnosis not present

## 2020-02-28 DIAGNOSIS — L989 Disorder of the skin and subcutaneous tissue, unspecified: Secondary | ICD-10-CM | POA: Diagnosis not present

## 2020-02-28 DIAGNOSIS — I890 Lymphedema, not elsewhere classified: Secondary | ICD-10-CM | POA: Diagnosis not present

## 2020-02-28 DIAGNOSIS — M2011 Hallux valgus (acquired), right foot: Secondary | ICD-10-CM | POA: Diagnosis not present

## 2020-02-28 DIAGNOSIS — M160 Bilateral primary osteoarthritis of hip: Secondary | ICD-10-CM | POA: Diagnosis not present

## 2020-02-28 DIAGNOSIS — N3090 Cystitis, unspecified without hematuria: Secondary | ICD-10-CM | POA: Diagnosis not present

## 2020-02-28 DIAGNOSIS — L539 Erythematous condition, unspecified: Secondary | ICD-10-CM | POA: Diagnosis not present

## 2020-02-28 DIAGNOSIS — E11622 Type 2 diabetes mellitus with other skin ulcer: Secondary | ICD-10-CM | POA: Diagnosis not present

## 2020-02-28 DIAGNOSIS — Z7189 Other specified counseling: Secondary | ICD-10-CM | POA: Diagnosis not present

## 2020-02-28 DIAGNOSIS — R3989 Other symptoms and signs involving the genitourinary system: Secondary | ICD-10-CM | POA: Diagnosis not present

## 2020-02-28 DIAGNOSIS — K811 Chronic cholecystitis: Secondary | ICD-10-CM | POA: Diagnosis not present

## 2020-02-28 DIAGNOSIS — I700 Atherosclerosis of aorta: Secondary | ICD-10-CM | POA: Diagnosis not present

## 2020-02-28 DIAGNOSIS — E113293 Type 2 diabetes mellitus with mild nonproliferative diabetic retinopathy without macular edema, bilateral: Secondary | ICD-10-CM | POA: Diagnosis not present

## 2020-02-28 DIAGNOSIS — N471 Phimosis: Secondary | ICD-10-CM | POA: Diagnosis not present

## 2020-02-28 DIAGNOSIS — H2511 Age-related nuclear cataract, right eye: Secondary | ICD-10-CM | POA: Diagnosis not present

## 2020-02-28 DIAGNOSIS — M25642 Stiffness of left hand, not elsewhere classified: Secondary | ICD-10-CM | POA: Diagnosis not present

## 2020-02-28 DIAGNOSIS — Z13820 Encounter for screening for osteoporosis: Secondary | ICD-10-CM | POA: Diagnosis not present

## 2020-02-28 DIAGNOSIS — C50912 Malignant neoplasm of unspecified site of left female breast: Secondary | ICD-10-CM | POA: Diagnosis not present

## 2020-02-28 DIAGNOSIS — E119 Type 2 diabetes mellitus without complications: Secondary | ICD-10-CM | POA: Diagnosis not present

## 2020-02-28 DIAGNOSIS — M19041 Primary osteoarthritis, right hand: Secondary | ICD-10-CM | POA: Diagnosis not present

## 2020-02-28 DIAGNOSIS — E11311 Type 2 diabetes mellitus with unspecified diabetic retinopathy with macular edema: Secondary | ICD-10-CM | POA: Diagnosis not present

## 2020-02-28 DIAGNOSIS — S41112D Laceration without foreign body of left upper arm, subsequent encounter: Secondary | ICD-10-CM | POA: Diagnosis not present

## 2020-02-28 DIAGNOSIS — M25631 Stiffness of right wrist, not elsewhere classified: Secondary | ICD-10-CM | POA: Diagnosis not present

## 2020-02-28 DIAGNOSIS — I051 Rheumatic mitral insufficiency: Secondary | ICD-10-CM | POA: Diagnosis not present

## 2020-02-28 DIAGNOSIS — K589 Irritable bowel syndrome without diarrhea: Secondary | ICD-10-CM | POA: Diagnosis not present

## 2020-02-28 DIAGNOSIS — D508 Other iron deficiency anemias: Secondary | ICD-10-CM | POA: Diagnosis not present

## 2020-02-28 DIAGNOSIS — N138 Other obstructive and reflux uropathy: Secondary | ICD-10-CM | POA: Diagnosis not present

## 2020-02-28 DIAGNOSIS — M25471 Effusion, right ankle: Secondary | ICD-10-CM | POA: Diagnosis not present

## 2020-02-28 DIAGNOSIS — M546 Pain in thoracic spine: Secondary | ICD-10-CM | POA: Diagnosis not present

## 2020-02-28 DIAGNOSIS — B372 Candidiasis of skin and nail: Secondary | ICD-10-CM | POA: Diagnosis not present

## 2020-02-28 DIAGNOSIS — F410 Panic disorder [episodic paroxysmal anxiety] without agoraphobia: Secondary | ICD-10-CM | POA: Diagnosis not present

## 2020-02-28 DIAGNOSIS — N3001 Acute cystitis with hematuria: Secondary | ICD-10-CM | POA: Diagnosis not present

## 2020-02-28 DIAGNOSIS — M130 Polyarthritis, unspecified: Secondary | ICD-10-CM | POA: Diagnosis not present

## 2020-02-28 DIAGNOSIS — Z9842 Cataract extraction status, left eye: Secondary | ICD-10-CM | POA: Diagnosis not present

## 2020-02-28 DIAGNOSIS — H02132 Senile ectropion of right lower eyelid: Secondary | ICD-10-CM | POA: Diagnosis not present

## 2020-02-28 DIAGNOSIS — K660 Peritoneal adhesions (postprocedural) (postinfection): Secondary | ICD-10-CM | POA: Diagnosis not present

## 2020-02-28 DIAGNOSIS — F0631 Mood disorder due to known physiological condition with depressive features: Secondary | ICD-10-CM | POA: Diagnosis not present

## 2020-02-28 DIAGNOSIS — F3341 Major depressive disorder, recurrent, in partial remission: Secondary | ICD-10-CM | POA: Diagnosis not present

## 2020-02-28 DIAGNOSIS — M7542 Impingement syndrome of left shoulder: Secondary | ICD-10-CM | POA: Diagnosis not present

## 2020-02-28 DIAGNOSIS — K9409 Other complications of colostomy: Secondary | ICD-10-CM | POA: Diagnosis not present

## 2020-02-28 DIAGNOSIS — Z01419 Encounter for gynecological examination (general) (routine) without abnormal findings: Secondary | ICD-10-CM | POA: Diagnosis not present

## 2020-02-28 DIAGNOSIS — E0843 Diabetes mellitus due to underlying condition with diabetic autonomic (poly)neuropathy: Secondary | ICD-10-CM | POA: Diagnosis not present

## 2020-02-28 DIAGNOSIS — Z6841 Body Mass Index (BMI) 40.0 and over, adult: Secondary | ICD-10-CM | POA: Diagnosis not present

## 2020-02-28 DIAGNOSIS — I7300 Raynaud's syndrome without gangrene: Secondary | ICD-10-CM | POA: Diagnosis not present

## 2020-02-28 DIAGNOSIS — M8589 Other specified disorders of bone density and structure, multiple sites: Secondary | ICD-10-CM | POA: Diagnosis not present

## 2020-02-28 DIAGNOSIS — Z130 Encounter for screening for diseases of the blood and blood-forming organs and certain disorders involving the immune mechanism: Secondary | ICD-10-CM | POA: Diagnosis not present

## 2020-02-28 DIAGNOSIS — H35341 Macular cyst, hole, or pseudohole, right eye: Secondary | ICD-10-CM | POA: Diagnosis not present

## 2020-02-28 DIAGNOSIS — G620 Drug-induced polyneuropathy: Secondary | ICD-10-CM | POA: Diagnosis not present

## 2020-02-28 DIAGNOSIS — E519 Thiamine deficiency, unspecified: Secondary | ICD-10-CM | POA: Diagnosis not present

## 2020-02-28 DIAGNOSIS — K769 Liver disease, unspecified: Secondary | ICD-10-CM | POA: Diagnosis not present

## 2020-02-28 DIAGNOSIS — S83282A Other tear of lateral meniscus, current injury, left knee, initial encounter: Secondary | ICD-10-CM | POA: Diagnosis not present

## 2020-02-28 DIAGNOSIS — R180 Malignant ascites: Secondary | ICD-10-CM | POA: Diagnosis not present

## 2020-02-28 DIAGNOSIS — N3946 Mixed incontinence: Secondary | ICD-10-CM | POA: Diagnosis not present

## 2020-02-28 DIAGNOSIS — T22231A Burn of second degree of right upper arm, initial encounter: Secondary | ICD-10-CM | POA: Diagnosis not present

## 2020-02-28 DIAGNOSIS — F4323 Adjustment disorder with mixed anxiety and depressed mood: Secondary | ICD-10-CM | POA: Diagnosis not present

## 2020-02-28 DIAGNOSIS — R634 Abnormal weight loss: Secondary | ICD-10-CM | POA: Diagnosis not present

## 2020-02-28 DIAGNOSIS — K6289 Other specified diseases of anus and rectum: Secondary | ICD-10-CM | POA: Diagnosis not present

## 2020-02-28 DIAGNOSIS — Z8673 Personal history of transient ischemic attack (TIA), and cerebral infarction without residual deficits: Secondary | ICD-10-CM | POA: Diagnosis not present

## 2020-02-28 DIAGNOSIS — R682 Dry mouth, unspecified: Secondary | ICD-10-CM | POA: Diagnosis not present

## 2020-02-28 DIAGNOSIS — H40009 Preglaucoma, unspecified, unspecified eye: Secondary | ICD-10-CM | POA: Diagnosis not present

## 2020-02-28 DIAGNOSIS — L97522 Non-pressure chronic ulcer of other part of left foot with fat layer exposed: Secondary | ICD-10-CM | POA: Diagnosis not present

## 2020-02-28 DIAGNOSIS — Z86711 Personal history of pulmonary embolism: Secondary | ICD-10-CM | POA: Diagnosis not present

## 2020-02-28 DIAGNOSIS — Z471 Aftercare following joint replacement surgery: Secondary | ICD-10-CM | POA: Diagnosis not present

## 2020-02-28 DIAGNOSIS — Z85028 Personal history of other malignant neoplasm of stomach: Secondary | ICD-10-CM | POA: Diagnosis not present

## 2020-02-28 DIAGNOSIS — H02055 Trichiasis without entropian left lower eyelid: Secondary | ICD-10-CM | POA: Diagnosis not present

## 2020-02-28 DIAGNOSIS — L03115 Cellulitis of right lower limb: Secondary | ICD-10-CM | POA: Diagnosis not present

## 2020-02-28 DIAGNOSIS — M25872 Other specified joint disorders, left ankle and foot: Secondary | ICD-10-CM | POA: Diagnosis not present

## 2020-02-28 DIAGNOSIS — F0390 Unspecified dementia without behavioral disturbance: Secondary | ICD-10-CM | POA: Diagnosis not present

## 2020-02-28 DIAGNOSIS — L4059 Other psoriatic arthropathy: Secondary | ICD-10-CM | POA: Diagnosis not present

## 2020-02-28 DIAGNOSIS — J9691 Respiratory failure, unspecified with hypoxia: Secondary | ICD-10-CM | POA: Diagnosis not present

## 2020-02-28 DIAGNOSIS — Z880 Allergy status to penicillin: Secondary | ICD-10-CM | POA: Diagnosis not present

## 2020-02-28 DIAGNOSIS — C155 Malignant neoplasm of lower third of esophagus: Secondary | ICD-10-CM | POA: Diagnosis not present

## 2020-02-28 DIAGNOSIS — E109 Type 1 diabetes mellitus without complications: Secondary | ICD-10-CM | POA: Diagnosis not present

## 2020-02-28 DIAGNOSIS — R3915 Urgency of urination: Secondary | ICD-10-CM | POA: Diagnosis not present

## 2020-02-28 DIAGNOSIS — H02403 Unspecified ptosis of bilateral eyelids: Secondary | ICD-10-CM | POA: Diagnosis not present

## 2020-02-28 DIAGNOSIS — D61818 Other pancytopenia: Secondary | ICD-10-CM | POA: Diagnosis not present

## 2020-02-28 DIAGNOSIS — J939 Pneumothorax, unspecified: Secondary | ICD-10-CM | POA: Diagnosis not present

## 2020-02-28 DIAGNOSIS — M2669 Other specified disorders of temporomandibular joint: Secondary | ICD-10-CM | POA: Diagnosis not present

## 2020-02-28 DIAGNOSIS — W540XXA Bitten by dog, initial encounter: Secondary | ICD-10-CM | POA: Diagnosis not present

## 2020-02-28 DIAGNOSIS — R799 Abnormal finding of blood chemistry, unspecified: Secondary | ICD-10-CM | POA: Diagnosis not present

## 2020-02-28 DIAGNOSIS — T84033A Mechanical loosening of internal left knee prosthetic joint, initial encounter: Secondary | ICD-10-CM | POA: Diagnosis not present

## 2020-02-28 DIAGNOSIS — D638 Anemia in other chronic diseases classified elsewhere: Secondary | ICD-10-CM | POA: Diagnosis not present

## 2020-02-28 DIAGNOSIS — N993 Prolapse of vaginal vault after hysterectomy: Secondary | ICD-10-CM | POA: Diagnosis not present

## 2020-02-28 DIAGNOSIS — S39012D Strain of muscle, fascia and tendon of lower back, subsequent encounter: Secondary | ICD-10-CM | POA: Diagnosis not present

## 2020-02-28 DIAGNOSIS — N3020 Other chronic cystitis without hematuria: Secondary | ICD-10-CM | POA: Diagnosis not present

## 2020-02-28 DIAGNOSIS — I69193 Ataxia following nontraumatic intracerebral hemorrhage: Secondary | ICD-10-CM | POA: Diagnosis not present

## 2020-02-28 DIAGNOSIS — H47012 Ischemic optic neuropathy, left eye: Secondary | ICD-10-CM | POA: Diagnosis not present

## 2020-02-28 DIAGNOSIS — E876 Hypokalemia: Secondary | ICD-10-CM | POA: Diagnosis not present

## 2020-02-28 DIAGNOSIS — Z7409 Other reduced mobility: Secondary | ICD-10-CM | POA: Diagnosis not present

## 2020-02-28 DIAGNOSIS — M4125 Other idiopathic scoliosis, thoracolumbar region: Secondary | ICD-10-CM | POA: Diagnosis not present

## 2020-02-28 DIAGNOSIS — R404 Transient alteration of awareness: Secondary | ICD-10-CM | POA: Diagnosis not present

## 2020-02-28 DIAGNOSIS — M7061 Trochanteric bursitis, right hip: Secondary | ICD-10-CM | POA: Diagnosis not present

## 2020-02-28 DIAGNOSIS — Z713 Dietary counseling and surveillance: Secondary | ICD-10-CM | POA: Diagnosis not present

## 2020-02-28 DIAGNOSIS — F5001 Anorexia nervosa, restricting type: Secondary | ICD-10-CM | POA: Diagnosis not present

## 2020-02-28 DIAGNOSIS — H43821 Vitreomacular adhesion, right eye: Secondary | ICD-10-CM | POA: Diagnosis not present

## 2020-02-28 DIAGNOSIS — T402X5A Adverse effect of other opioids, initial encounter: Secondary | ICD-10-CM | POA: Diagnosis not present

## 2020-02-28 DIAGNOSIS — M26629 Arthralgia of temporomandibular joint, unspecified side: Secondary | ICD-10-CM | POA: Diagnosis not present

## 2020-02-28 DIAGNOSIS — Z7951 Long term (current) use of inhaled steroids: Secondary | ICD-10-CM | POA: Diagnosis not present

## 2020-02-28 DIAGNOSIS — Z91041 Radiographic dye allergy status: Secondary | ICD-10-CM | POA: Diagnosis not present

## 2020-02-28 DIAGNOSIS — E11319 Type 2 diabetes mellitus with unspecified diabetic retinopathy without macular edema: Secondary | ICD-10-CM | POA: Diagnosis not present

## 2020-02-28 DIAGNOSIS — Z8701 Personal history of pneumonia (recurrent): Secondary | ICD-10-CM | POA: Diagnosis not present

## 2020-02-28 DIAGNOSIS — M9901 Segmental and somatic dysfunction of cervical region: Secondary | ICD-10-CM | POA: Diagnosis not present

## 2020-02-28 DIAGNOSIS — J310 Chronic rhinitis: Secondary | ICD-10-CM | POA: Diagnosis not present

## 2020-02-28 DIAGNOSIS — R768 Other specified abnormal immunological findings in serum: Secondary | ICD-10-CM | POA: Diagnosis not present

## 2020-02-28 DIAGNOSIS — Z96641 Presence of right artificial hip joint: Secondary | ICD-10-CM | POA: Diagnosis not present

## 2020-02-28 DIAGNOSIS — F840 Autistic disorder: Secondary | ICD-10-CM | POA: Diagnosis not present

## 2020-02-28 DIAGNOSIS — I7102 Dissection of abdominal aorta: Secondary | ICD-10-CM | POA: Diagnosis not present

## 2020-02-28 DIAGNOSIS — Z952 Presence of prosthetic heart valve: Secondary | ICD-10-CM | POA: Diagnosis not present

## 2020-02-28 DIAGNOSIS — H02413 Mechanical ptosis of bilateral eyelids: Secondary | ICD-10-CM | POA: Diagnosis not present

## 2020-02-28 DIAGNOSIS — M75122 Complete rotator cuff tear or rupture of left shoulder, not specified as traumatic: Secondary | ICD-10-CM | POA: Diagnosis not present

## 2020-02-28 DIAGNOSIS — K7581 Nonalcoholic steatohepatitis (NASH): Secondary | ICD-10-CM | POA: Diagnosis not present

## 2020-02-28 DIAGNOSIS — R7989 Other specified abnormal findings of blood chemistry: Secondary | ICD-10-CM | POA: Diagnosis not present

## 2020-02-28 DIAGNOSIS — H52223 Regular astigmatism, bilateral: Secondary | ICD-10-CM | POA: Diagnosis not present

## 2020-02-28 DIAGNOSIS — I69151 Hemiplegia and hemiparesis following nontraumatic intracerebral hemorrhage affecting right dominant side: Secondary | ICD-10-CM | POA: Diagnosis not present

## 2020-02-28 DIAGNOSIS — X32XXXA Exposure to sunlight, initial encounter: Secondary | ICD-10-CM | POA: Diagnosis not present

## 2020-02-28 DIAGNOSIS — J690 Pneumonitis due to inhalation of food and vomit: Secondary | ICD-10-CM | POA: Diagnosis not present

## 2020-02-28 DIAGNOSIS — J301 Allergic rhinitis due to pollen: Secondary | ICD-10-CM | POA: Diagnosis not present

## 2020-02-28 DIAGNOSIS — Z86010 Personal history of colonic polyps: Secondary | ICD-10-CM | POA: Diagnosis not present

## 2020-02-28 DIAGNOSIS — J948 Other specified pleural conditions: Secondary | ICD-10-CM | POA: Diagnosis not present

## 2020-02-28 DIAGNOSIS — J9601 Acute respiratory failure with hypoxia: Secondary | ICD-10-CM | POA: Diagnosis not present

## 2020-02-28 DIAGNOSIS — Z885 Allergy status to narcotic agent status: Secondary | ICD-10-CM | POA: Diagnosis not present

## 2020-02-28 DIAGNOSIS — E871 Hypo-osmolality and hyponatremia: Secondary | ICD-10-CM | POA: Diagnosis not present

## 2020-02-28 DIAGNOSIS — B079 Viral wart, unspecified: Secondary | ICD-10-CM | POA: Diagnosis not present

## 2020-02-28 DIAGNOSIS — M2012 Hallux valgus (acquired), left foot: Secondary | ICD-10-CM | POA: Diagnosis not present

## 2020-02-28 DIAGNOSIS — R339 Retention of urine, unspecified: Secondary | ICD-10-CM | POA: Diagnosis not present

## 2020-02-28 DIAGNOSIS — G301 Alzheimer's disease with late onset: Secondary | ICD-10-CM | POA: Diagnosis not present

## 2020-02-28 DIAGNOSIS — R2689 Other abnormalities of gait and mobility: Secondary | ICD-10-CM | POA: Diagnosis not present

## 2020-02-28 DIAGNOSIS — M25541 Pain in joints of right hand: Secondary | ICD-10-CM | POA: Diagnosis not present

## 2020-02-28 DIAGNOSIS — M8580 Other specified disorders of bone density and structure, unspecified site: Secondary | ICD-10-CM | POA: Diagnosis not present

## 2020-02-28 DIAGNOSIS — Z8379 Family history of other diseases of the digestive system: Secondary | ICD-10-CM | POA: Diagnosis not present

## 2020-02-28 DIAGNOSIS — D509 Iron deficiency anemia, unspecified: Secondary | ICD-10-CM | POA: Diagnosis not present

## 2020-02-28 DIAGNOSIS — M75111 Incomplete rotator cuff tear or rupture of right shoulder, not specified as traumatic: Secondary | ICD-10-CM | POA: Diagnosis not present

## 2020-02-28 DIAGNOSIS — R296 Repeated falls: Secondary | ICD-10-CM | POA: Diagnosis not present

## 2020-02-28 DIAGNOSIS — Z01818 Encounter for other preprocedural examination: Secondary | ICD-10-CM | POA: Diagnosis not present

## 2020-02-28 DIAGNOSIS — X58XXXA Exposure to other specified factors, initial encounter: Secondary | ICD-10-CM | POA: Diagnosis not present

## 2020-02-28 DIAGNOSIS — K7460 Unspecified cirrhosis of liver: Secondary | ICD-10-CM | POA: Diagnosis not present

## 2020-02-28 DIAGNOSIS — M2550 Pain in unspecified joint: Secondary | ICD-10-CM | POA: Diagnosis not present

## 2020-02-28 DIAGNOSIS — Z87442 Personal history of urinary calculi: Secondary | ICD-10-CM | POA: Diagnosis not present

## 2020-02-28 DIAGNOSIS — I87331 Chronic venous hypertension (idiopathic) with ulcer and inflammation of right lower extremity: Secondary | ICD-10-CM | POA: Diagnosis not present

## 2020-02-28 DIAGNOSIS — H905 Unspecified sensorineural hearing loss: Secondary | ICD-10-CM | POA: Diagnosis not present

## 2020-02-28 DIAGNOSIS — C946 Myelodysplastic disease, not classified: Secondary | ICD-10-CM | POA: Diagnosis not present

## 2020-02-28 DIAGNOSIS — M25571 Pain in right ankle and joints of right foot: Secondary | ICD-10-CM | POA: Diagnosis not present

## 2020-02-28 DIAGNOSIS — M899 Disorder of bone, unspecified: Secondary | ICD-10-CM | POA: Diagnosis not present

## 2020-02-28 DIAGNOSIS — M1711 Unilateral primary osteoarthritis, right knee: Secondary | ICD-10-CM | POA: Diagnosis not present

## 2020-02-28 DIAGNOSIS — C3491 Malignant neoplasm of unspecified part of right bronchus or lung: Secondary | ICD-10-CM | POA: Diagnosis not present

## 2020-02-28 DIAGNOSIS — S52592A Other fractures of lower end of left radius, initial encounter for closed fracture: Secondary | ICD-10-CM | POA: Diagnosis not present

## 2020-02-28 DIAGNOSIS — Z792 Long term (current) use of antibiotics: Secondary | ICD-10-CM | POA: Diagnosis not present

## 2020-02-28 DIAGNOSIS — Z6822 Body mass index (BMI) 22.0-22.9, adult: Secondary | ICD-10-CM | POA: Diagnosis not present

## 2020-02-28 DIAGNOSIS — R82998 Other abnormal findings in urine: Secondary | ICD-10-CM | POA: Diagnosis not present

## 2020-02-28 DIAGNOSIS — D2112 Benign neoplasm of connective and other soft tissue of left upper limb, including shoulder: Secondary | ICD-10-CM | POA: Diagnosis not present

## 2020-02-28 DIAGNOSIS — H353131 Nonexudative age-related macular degeneration, bilateral, early dry stage: Secondary | ICD-10-CM | POA: Diagnosis not present

## 2020-02-28 DIAGNOSIS — J3081 Allergic rhinitis due to animal (cat) (dog) hair and dander: Secondary | ICD-10-CM | POA: Diagnosis not present

## 2020-02-28 DIAGNOSIS — R1013 Epigastric pain: Secondary | ICD-10-CM | POA: Diagnosis not present

## 2020-02-28 DIAGNOSIS — I5022 Chronic systolic (congestive) heart failure: Secondary | ICD-10-CM | POA: Diagnosis not present

## 2020-02-28 DIAGNOSIS — F5105 Insomnia due to other mental disorder: Secondary | ICD-10-CM | POA: Diagnosis not present

## 2020-02-28 DIAGNOSIS — R262 Difficulty in walking, not elsewhere classified: Secondary | ICD-10-CM | POA: Diagnosis not present

## 2020-02-28 DIAGNOSIS — H9113 Presbycusis, bilateral: Secondary | ICD-10-CM | POA: Diagnosis not present

## 2020-02-28 DIAGNOSIS — D2330 Other benign neoplasm of skin of unspecified part of face: Secondary | ICD-10-CM | POA: Diagnosis not present

## 2020-02-28 DIAGNOSIS — N529 Male erectile dysfunction, unspecified: Secondary | ICD-10-CM | POA: Diagnosis not present

## 2020-02-28 DIAGNOSIS — E5111 Dry beriberi: Secondary | ICD-10-CM | POA: Diagnosis not present

## 2020-02-28 DIAGNOSIS — H35351 Cystoid macular degeneration, right eye: Secondary | ICD-10-CM | POA: Diagnosis not present

## 2020-02-28 DIAGNOSIS — Z4789 Encounter for other orthopedic aftercare: Secondary | ICD-10-CM | POA: Diagnosis not present

## 2020-02-28 DIAGNOSIS — F3112 Bipolar disorder, current episode manic without psychotic features, moderate: Secondary | ICD-10-CM | POA: Diagnosis not present

## 2020-02-28 DIAGNOSIS — H353111 Nonexudative age-related macular degeneration, right eye, early dry stage: Secondary | ICD-10-CM | POA: Diagnosis not present

## 2020-02-28 DIAGNOSIS — K229 Disease of esophagus, unspecified: Secondary | ICD-10-CM | POA: Diagnosis not present

## 2020-02-28 DIAGNOSIS — L4050 Arthropathic psoriasis, unspecified: Secondary | ICD-10-CM | POA: Diagnosis not present

## 2020-02-28 DIAGNOSIS — C8228 Follicular lymphoma grade III, unspecified, lymph nodes of multiple sites: Secondary | ICD-10-CM | POA: Diagnosis not present

## 2020-02-28 DIAGNOSIS — S62102G Fracture of unspecified carpal bone, left wrist, subsequent encounter for fracture with delayed healing: Secondary | ICD-10-CM | POA: Diagnosis not present

## 2020-02-28 DIAGNOSIS — I87333 Chronic venous hypertension (idiopathic) with ulcer and inflammation of bilateral lower extremity: Secondary | ICD-10-CM | POA: Diagnosis not present

## 2020-02-28 DIAGNOSIS — D0501 Lobular carcinoma in situ of right breast: Secondary | ICD-10-CM | POA: Diagnosis not present

## 2020-02-28 DIAGNOSIS — F4310 Post-traumatic stress disorder, unspecified: Secondary | ICD-10-CM | POA: Diagnosis not present

## 2020-02-28 DIAGNOSIS — S299XXA Unspecified injury of thorax, initial encounter: Secondary | ICD-10-CM | POA: Diagnosis not present

## 2020-02-28 DIAGNOSIS — M25572 Pain in left ankle and joints of left foot: Secondary | ICD-10-CM | POA: Diagnosis not present

## 2020-02-28 DIAGNOSIS — D7589 Other specified diseases of blood and blood-forming organs: Secondary | ICD-10-CM | POA: Diagnosis not present

## 2020-02-28 DIAGNOSIS — D1801 Hemangioma of skin and subcutaneous tissue: Secondary | ICD-10-CM | POA: Diagnosis not present

## 2020-02-28 DIAGNOSIS — D3132 Benign neoplasm of left choroid: Secondary | ICD-10-CM | POA: Diagnosis not present

## 2020-02-28 DIAGNOSIS — M069 Rheumatoid arthritis, unspecified: Secondary | ICD-10-CM | POA: Diagnosis not present

## 2020-02-28 DIAGNOSIS — R5383 Other fatigue: Secondary | ICD-10-CM | POA: Diagnosis not present

## 2020-02-28 DIAGNOSIS — Z131 Encounter for screening for diabetes mellitus: Secondary | ICD-10-CM | POA: Diagnosis not present

## 2020-02-28 DIAGNOSIS — I2510 Atherosclerotic heart disease of native coronary artery without angina pectoris: Secondary | ICD-10-CM | POA: Diagnosis not present

## 2020-02-28 DIAGNOSIS — D044 Carcinoma in situ of skin of scalp and neck: Secondary | ICD-10-CM | POA: Diagnosis not present

## 2020-02-28 DIAGNOSIS — Z930 Tracheostomy status: Secondary | ICD-10-CM | POA: Diagnosis not present

## 2020-02-28 DIAGNOSIS — R946 Abnormal results of thyroid function studies: Secondary | ICD-10-CM | POA: Diagnosis not present

## 2020-02-28 DIAGNOSIS — D2271 Melanocytic nevi of right lower limb, including hip: Secondary | ICD-10-CM | POA: Diagnosis not present

## 2020-02-28 DIAGNOSIS — M25522 Pain in left elbow: Secondary | ICD-10-CM | POA: Diagnosis not present

## 2020-02-28 DIAGNOSIS — N816 Rectocele: Secondary | ICD-10-CM | POA: Diagnosis not present

## 2020-02-28 DIAGNOSIS — C3490 Malignant neoplasm of unspecified part of unspecified bronchus or lung: Secondary | ICD-10-CM | POA: Diagnosis not present

## 2020-02-28 DIAGNOSIS — M47812 Spondylosis without myelopathy or radiculopathy, cervical region: Secondary | ICD-10-CM | POA: Diagnosis not present

## 2020-02-28 DIAGNOSIS — S42291A Other displaced fracture of upper end of right humerus, initial encounter for closed fracture: Secondary | ICD-10-CM | POA: Diagnosis not present

## 2020-02-28 DIAGNOSIS — S93421A Sprain of deltoid ligament of right ankle, initial encounter: Secondary | ICD-10-CM | POA: Diagnosis not present

## 2020-02-28 DIAGNOSIS — Z95810 Presence of automatic (implantable) cardiac defibrillator: Secondary | ICD-10-CM | POA: Diagnosis not present

## 2020-02-28 DIAGNOSIS — Z96652 Presence of left artificial knee joint: Secondary | ICD-10-CM | POA: Diagnosis not present

## 2020-02-28 DIAGNOSIS — J9692 Respiratory failure, unspecified with hypercapnia: Secondary | ICD-10-CM | POA: Diagnosis not present

## 2020-02-28 DIAGNOSIS — M150 Primary generalized (osteo)arthritis: Secondary | ICD-10-CM | POA: Diagnosis not present

## 2020-02-28 DIAGNOSIS — L814 Other melanin hyperpigmentation: Secondary | ICD-10-CM | POA: Diagnosis not present

## 2020-02-28 DIAGNOSIS — H43313 Vitreous membranes and strands, bilateral: Secondary | ICD-10-CM | POA: Diagnosis not present

## 2020-02-28 DIAGNOSIS — M353 Polymyalgia rheumatica: Secondary | ICD-10-CM | POA: Diagnosis not present

## 2020-02-28 DIAGNOSIS — I25118 Atherosclerotic heart disease of native coronary artery with other forms of angina pectoris: Secondary | ICD-10-CM | POA: Diagnosis not present

## 2020-02-28 DIAGNOSIS — E049 Nontoxic goiter, unspecified: Secondary | ICD-10-CM | POA: Diagnosis not present

## 2020-02-28 DIAGNOSIS — S42292D Other displaced fracture of upper end of left humerus, subsequent encounter for fracture with routine healing: Secondary | ICD-10-CM | POA: Diagnosis not present

## 2020-02-28 DIAGNOSIS — E870 Hyperosmolality and hypernatremia: Secondary | ICD-10-CM | POA: Diagnosis not present

## 2020-02-28 DIAGNOSIS — Z853 Personal history of malignant neoplasm of breast: Secondary | ICD-10-CM | POA: Diagnosis not present

## 2020-02-28 DIAGNOSIS — Z5181 Encounter for therapeutic drug level monitoring: Secondary | ICD-10-CM | POA: Diagnosis not present

## 2020-02-28 DIAGNOSIS — D519 Vitamin B12 deficiency anemia, unspecified: Secondary | ICD-10-CM | POA: Diagnosis not present

## 2020-02-28 DIAGNOSIS — K5720 Diverticulitis of large intestine with perforation and abscess without bleeding: Secondary | ICD-10-CM | POA: Diagnosis not present

## 2020-02-28 DIAGNOSIS — M25532 Pain in left wrist: Secondary | ICD-10-CM | POA: Diagnosis not present

## 2020-02-28 DIAGNOSIS — I69398 Other sequelae of cerebral infarction: Secondary | ICD-10-CM | POA: Diagnosis not present

## 2020-02-28 DIAGNOSIS — Z978 Presence of other specified devices: Secondary | ICD-10-CM | POA: Diagnosis not present

## 2020-02-28 DIAGNOSIS — R778 Other specified abnormalities of plasma proteins: Secondary | ICD-10-CM | POA: Diagnosis not present

## 2020-02-28 DIAGNOSIS — L110 Acquired keratosis follicularis: Secondary | ICD-10-CM | POA: Diagnosis not present

## 2020-02-28 DIAGNOSIS — Z89411 Acquired absence of right great toe: Secondary | ICD-10-CM | POA: Diagnosis not present

## 2020-02-28 DIAGNOSIS — M654 Radial styloid tenosynovitis [de Quervain]: Secondary | ICD-10-CM | POA: Diagnosis not present

## 2020-02-28 DIAGNOSIS — R748 Abnormal levels of other serum enzymes: Secondary | ICD-10-CM | POA: Diagnosis not present

## 2020-02-28 DIAGNOSIS — R4781 Slurred speech: Secondary | ICD-10-CM | POA: Diagnosis not present

## 2020-02-28 DIAGNOSIS — L918 Other hypertrophic disorders of the skin: Secondary | ICD-10-CM | POA: Diagnosis not present

## 2020-02-28 DIAGNOSIS — D72829 Elevated white blood cell count, unspecified: Secondary | ICD-10-CM | POA: Diagnosis not present

## 2020-02-28 DIAGNOSIS — H353211 Exudative age-related macular degeneration, right eye, with active choroidal neovascularization: Secondary | ICD-10-CM | POA: Diagnosis not present

## 2020-02-28 DIAGNOSIS — S83242A Other tear of medial meniscus, current injury, left knee, initial encounter: Secondary | ICD-10-CM | POA: Diagnosis not present

## 2020-02-28 DIAGNOSIS — H35033 Hypertensive retinopathy, bilateral: Secondary | ICD-10-CM | POA: Diagnosis not present

## 2020-02-28 DIAGNOSIS — M25531 Pain in right wrist: Secondary | ICD-10-CM | POA: Diagnosis not present

## 2020-02-28 DIAGNOSIS — M5137 Other intervertebral disc degeneration, lumbosacral region: Secondary | ICD-10-CM | POA: Diagnosis not present

## 2020-02-28 DIAGNOSIS — Z9622 Myringotomy tube(s) status: Secondary | ICD-10-CM | POA: Diagnosis not present

## 2020-02-28 DIAGNOSIS — M65331 Trigger finger, right middle finger: Secondary | ICD-10-CM | POA: Diagnosis not present

## 2020-02-28 DIAGNOSIS — F3181 Bipolar II disorder: Secondary | ICD-10-CM | POA: Diagnosis not present

## 2020-02-28 DIAGNOSIS — I21A1 Myocardial infarction type 2: Secondary | ICD-10-CM | POA: Diagnosis not present

## 2020-02-28 DIAGNOSIS — M25612 Stiffness of left shoulder, not elsewhere classified: Secondary | ICD-10-CM | POA: Diagnosis not present

## 2020-02-28 DIAGNOSIS — I720 Aneurysm of carotid artery: Secondary | ICD-10-CM | POA: Diagnosis not present

## 2020-02-28 DIAGNOSIS — Z48813 Encounter for surgical aftercare following surgery on the respiratory system: Secondary | ICD-10-CM | POA: Diagnosis not present

## 2020-02-28 DIAGNOSIS — C44519 Basal cell carcinoma of skin of other part of trunk: Secondary | ICD-10-CM | POA: Diagnosis not present

## 2020-02-28 DIAGNOSIS — S0003XA Contusion of scalp, initial encounter: Secondary | ICD-10-CM | POA: Diagnosis not present

## 2020-02-28 DIAGNOSIS — I83019 Varicose veins of right lower extremity with ulcer of unspecified site: Secondary | ICD-10-CM | POA: Diagnosis not present

## 2020-02-28 DIAGNOSIS — R599 Enlarged lymph nodes, unspecified: Secondary | ICD-10-CM | POA: Diagnosis not present

## 2020-02-28 DIAGNOSIS — K2970 Gastritis, unspecified, without bleeding: Secondary | ICD-10-CM | POA: Diagnosis not present

## 2020-02-28 DIAGNOSIS — E213 Hyperparathyroidism, unspecified: Secondary | ICD-10-CM | POA: Diagnosis not present

## 2020-02-28 DIAGNOSIS — Z8661 Personal history of infections of the central nervous system: Secondary | ICD-10-CM | POA: Diagnosis not present

## 2020-02-28 DIAGNOSIS — M25662 Stiffness of left knee, not elsewhere classified: Secondary | ICD-10-CM | POA: Diagnosis not present

## 2020-02-28 DIAGNOSIS — E038 Other specified hypothyroidism: Secondary | ICD-10-CM | POA: Diagnosis not present

## 2020-02-28 DIAGNOSIS — H9193 Unspecified hearing loss, bilateral: Secondary | ICD-10-CM | POA: Diagnosis not present

## 2020-02-28 DIAGNOSIS — L03116 Cellulitis of left lower limb: Secondary | ICD-10-CM | POA: Diagnosis not present

## 2020-02-28 DIAGNOSIS — M4726 Other spondylosis with radiculopathy, lumbar region: Secondary | ICD-10-CM | POA: Diagnosis not present

## 2020-02-28 DIAGNOSIS — K8020 Calculus of gallbladder without cholecystitis without obstruction: Secondary | ICD-10-CM | POA: Diagnosis not present

## 2020-02-28 DIAGNOSIS — M5413 Radiculopathy, cervicothoracic region: Secondary | ICD-10-CM | POA: Diagnosis not present

## 2020-02-28 DIAGNOSIS — M19071 Primary osteoarthritis, right ankle and foot: Secondary | ICD-10-CM | POA: Diagnosis not present

## 2020-02-28 DIAGNOSIS — Z7984 Long term (current) use of oral hypoglycemic drugs: Secondary | ICD-10-CM | POA: Diagnosis not present

## 2020-02-28 DIAGNOSIS — R569 Unspecified convulsions: Secondary | ICD-10-CM | POA: Diagnosis not present

## 2020-02-28 DIAGNOSIS — M25562 Pain in left knee: Secondary | ICD-10-CM | POA: Diagnosis not present

## 2020-02-28 DIAGNOSIS — H0279 Other degenerative disorders of eyelid and periocular area: Secondary | ICD-10-CM | POA: Diagnosis not present

## 2020-02-28 DIAGNOSIS — E113491 Type 2 diabetes mellitus with severe nonproliferative diabetic retinopathy without macular edema, right eye: Secondary | ICD-10-CM | POA: Diagnosis not present

## 2020-02-28 DIAGNOSIS — C50812 Malignant neoplasm of overlapping sites of left female breast: Secondary | ICD-10-CM | POA: Diagnosis not present

## 2020-02-28 DIAGNOSIS — M75112 Incomplete rotator cuff tear or rupture of left shoulder, not specified as traumatic: Secondary | ICD-10-CM | POA: Diagnosis not present

## 2020-02-28 DIAGNOSIS — M47816 Spondylosis without myelopathy or radiculopathy, lumbar region: Secondary | ICD-10-CM | POA: Diagnosis not present

## 2020-02-28 DIAGNOSIS — D751 Secondary polycythemia: Secondary | ICD-10-CM | POA: Diagnosis not present

## 2020-02-28 DIAGNOSIS — K5901 Slow transit constipation: Secondary | ICD-10-CM | POA: Diagnosis not present

## 2020-02-28 DIAGNOSIS — S51812A Laceration without foreign body of left forearm, initial encounter: Secondary | ICD-10-CM | POA: Diagnosis not present

## 2020-02-28 DIAGNOSIS — D259 Leiomyoma of uterus, unspecified: Secondary | ICD-10-CM | POA: Diagnosis not present

## 2020-02-28 DIAGNOSIS — R809 Proteinuria, unspecified: Secondary | ICD-10-CM | POA: Diagnosis not present

## 2020-02-28 DIAGNOSIS — M62561 Muscle wasting and atrophy, not elsewhere classified, right lower leg: Secondary | ICD-10-CM | POA: Diagnosis not present

## 2020-02-28 DIAGNOSIS — M19022 Primary osteoarthritis, left elbow: Secondary | ICD-10-CM | POA: Diagnosis not present

## 2020-02-28 DIAGNOSIS — Q600 Renal agenesis, unilateral: Secondary | ICD-10-CM | POA: Diagnosis not present

## 2020-02-28 DIAGNOSIS — D492 Neoplasm of unspecified behavior of bone, soft tissue, and skin: Secondary | ICD-10-CM | POA: Diagnosis not present

## 2020-02-28 DIAGNOSIS — Z1159 Encounter for screening for other viral diseases: Secondary | ICD-10-CM | POA: Diagnosis not present

## 2020-02-28 DIAGNOSIS — R5381 Other malaise: Secondary | ICD-10-CM | POA: Diagnosis not present

## 2020-02-28 DIAGNOSIS — E113411 Type 2 diabetes mellitus with severe nonproliferative diabetic retinopathy with macular edema, right eye: Secondary | ICD-10-CM | POA: Diagnosis not present

## 2020-02-28 DIAGNOSIS — I441 Atrioventricular block, second degree: Secondary | ICD-10-CM | POA: Diagnosis not present

## 2020-02-28 DIAGNOSIS — F1320 Sedative, hypnotic or anxiolytic dependence, uncomplicated: Secondary | ICD-10-CM | POA: Diagnosis not present

## 2020-02-28 DIAGNOSIS — H18593 Other hereditary corneal dystrophies, bilateral: Secondary | ICD-10-CM | POA: Diagnosis not present

## 2020-02-28 DIAGNOSIS — I8311 Varicose veins of right lower extremity with inflammation: Secondary | ICD-10-CM | POA: Diagnosis not present

## 2020-02-28 DIAGNOSIS — E559 Vitamin D deficiency, unspecified: Secondary | ICD-10-CM | POA: Diagnosis not present

## 2020-02-28 DIAGNOSIS — Z981 Arthrodesis status: Secondary | ICD-10-CM | POA: Diagnosis not present

## 2020-02-28 DIAGNOSIS — R0609 Other forms of dyspnea: Secondary | ICD-10-CM | POA: Diagnosis not present

## 2020-02-28 DIAGNOSIS — Z87440 Personal history of urinary (tract) infections: Secondary | ICD-10-CM | POA: Diagnosis not present

## 2020-02-28 DIAGNOSIS — G4700 Insomnia, unspecified: Secondary | ICD-10-CM | POA: Diagnosis not present

## 2020-02-28 DIAGNOSIS — R0781 Pleurodynia: Secondary | ICD-10-CM | POA: Diagnosis not present

## 2020-02-28 DIAGNOSIS — G500 Trigeminal neuralgia: Secondary | ICD-10-CM | POA: Diagnosis not present

## 2020-02-28 DIAGNOSIS — E611 Iron deficiency: Secondary | ICD-10-CM | POA: Diagnosis not present

## 2020-02-28 DIAGNOSIS — D6869 Other thrombophilia: Secondary | ICD-10-CM | POA: Diagnosis not present

## 2020-02-28 DIAGNOSIS — Z9011 Acquired absence of right breast and nipple: Secondary | ICD-10-CM | POA: Diagnosis not present

## 2020-02-28 DIAGNOSIS — M7712 Lateral epicondylitis, left elbow: Secondary | ICD-10-CM | POA: Diagnosis not present

## 2020-02-28 DIAGNOSIS — M7989 Other specified soft tissue disorders: Secondary | ICD-10-CM | POA: Diagnosis not present

## 2020-02-28 DIAGNOSIS — R143 Flatulence: Secondary | ICD-10-CM | POA: Diagnosis not present

## 2020-02-28 DIAGNOSIS — Z923 Personal history of irradiation: Secondary | ICD-10-CM | POA: Diagnosis not present

## 2020-02-28 DIAGNOSIS — D229 Melanocytic nevi, unspecified: Secondary | ICD-10-CM | POA: Diagnosis not present

## 2020-02-28 DIAGNOSIS — F4321 Adjustment disorder with depressed mood: Secondary | ICD-10-CM | POA: Diagnosis not present

## 2020-02-28 DIAGNOSIS — D235 Other benign neoplasm of skin of trunk: Secondary | ICD-10-CM | POA: Diagnosis not present

## 2020-02-28 DIAGNOSIS — R197 Diarrhea, unspecified: Secondary | ICD-10-CM | POA: Diagnosis not present

## 2020-02-28 DIAGNOSIS — R6882 Decreased libido: Secondary | ICD-10-CM | POA: Diagnosis not present

## 2020-02-28 DIAGNOSIS — H353190 Nonexudative age-related macular degeneration, unspecified eye, stage unspecified: Secondary | ICD-10-CM | POA: Diagnosis not present

## 2020-02-28 DIAGNOSIS — Z7901 Long term (current) use of anticoagulants: Secondary | ICD-10-CM | POA: Diagnosis not present

## 2020-02-28 DIAGNOSIS — R41840 Attention and concentration deficit: Secondary | ICD-10-CM | POA: Diagnosis not present

## 2020-02-28 DIAGNOSIS — N6324 Unspecified lump in the left breast, lower inner quadrant: Secondary | ICD-10-CM | POA: Diagnosis not present

## 2020-02-28 DIAGNOSIS — I421 Obstructive hypertrophic cardiomyopathy: Secondary | ICD-10-CM | POA: Diagnosis not present

## 2020-02-28 DIAGNOSIS — Z124 Encounter for screening for malignant neoplasm of cervix: Secondary | ICD-10-CM | POA: Diagnosis not present

## 2020-02-28 DIAGNOSIS — M7062 Trochanteric bursitis, left hip: Secondary | ICD-10-CM | POA: Diagnosis not present

## 2020-02-28 DIAGNOSIS — I491 Atrial premature depolarization: Secondary | ICD-10-CM | POA: Diagnosis not present

## 2020-02-28 DIAGNOSIS — I429 Cardiomyopathy, unspecified: Secondary | ICD-10-CM | POA: Diagnosis not present

## 2020-02-28 DIAGNOSIS — M48061 Spinal stenosis, lumbar region without neurogenic claudication: Secondary | ICD-10-CM | POA: Diagnosis not present

## 2020-02-28 DIAGNOSIS — S6292XB Unspecified fracture of left wrist and hand, initial encounter for open fracture: Secondary | ICD-10-CM | POA: Diagnosis not present

## 2020-02-28 DIAGNOSIS — I129 Hypertensive chronic kidney disease with stage 1 through stage 4 chronic kidney disease, or unspecified chronic kidney disease: Secondary | ICD-10-CM | POA: Diagnosis not present

## 2020-02-28 DIAGNOSIS — Z03818 Encounter for observation for suspected exposure to other biological agents ruled out: Secondary | ICD-10-CM | POA: Diagnosis not present

## 2020-02-28 DIAGNOSIS — G510 Bell's palsy: Secondary | ICD-10-CM | POA: Diagnosis not present

## 2020-02-28 DIAGNOSIS — L89159 Pressure ulcer of sacral region, unspecified stage: Secondary | ICD-10-CM | POA: Diagnosis not present

## 2020-02-28 DIAGNOSIS — N1831 Chronic kidney disease, stage 3a: Secondary | ICD-10-CM | POA: Diagnosis not present

## 2020-02-28 DIAGNOSIS — Z5320 Procedure and treatment not carried out because of patient's decision for unspecified reasons: Secondary | ICD-10-CM | POA: Diagnosis not present

## 2020-02-28 DIAGNOSIS — H2510 Age-related nuclear cataract, unspecified eye: Secondary | ICD-10-CM | POA: Diagnosis not present

## 2020-02-28 DIAGNOSIS — M5031 Other cervical disc degeneration,  high cervical region: Secondary | ICD-10-CM | POA: Diagnosis not present

## 2020-02-28 DIAGNOSIS — M62552 Muscle wasting and atrophy, not elsewhere classified, left thigh: Secondary | ICD-10-CM | POA: Diagnosis not present

## 2020-02-28 DIAGNOSIS — L97511 Non-pressure chronic ulcer of other part of right foot limited to breakdown of skin: Secondary | ICD-10-CM | POA: Diagnosis not present

## 2020-02-28 DIAGNOSIS — S46011A Strain of muscle(s) and tendon(s) of the rotator cuff of right shoulder, initial encounter: Secondary | ICD-10-CM | POA: Diagnosis not present

## 2020-02-28 DIAGNOSIS — J42 Unspecified chronic bronchitis: Secondary | ICD-10-CM | POA: Diagnosis not present

## 2020-02-28 DIAGNOSIS — R531 Weakness: Secondary | ICD-10-CM | POA: Diagnosis not present

## 2020-02-28 DIAGNOSIS — M722 Plantar fascial fibromatosis: Secondary | ICD-10-CM | POA: Diagnosis not present

## 2020-02-28 DIAGNOSIS — M25462 Effusion, left knee: Secondary | ICD-10-CM | POA: Diagnosis not present

## 2020-02-28 DIAGNOSIS — M810 Age-related osteoporosis without current pathological fracture: Secondary | ICD-10-CM | POA: Diagnosis not present

## 2020-02-28 DIAGNOSIS — G44319 Acute post-traumatic headache, not intractable: Secondary | ICD-10-CM | POA: Diagnosis not present

## 2020-02-28 DIAGNOSIS — I495 Sick sinus syndrome: Secondary | ICD-10-CM | POA: Diagnosis not present

## 2020-02-28 DIAGNOSIS — M222X2 Patellofemoral disorders, left knee: Secondary | ICD-10-CM | POA: Diagnosis not present

## 2020-02-28 DIAGNOSIS — Z951 Presence of aortocoronary bypass graft: Secondary | ICD-10-CM | POA: Diagnosis not present

## 2020-02-28 DIAGNOSIS — J209 Acute bronchitis, unspecified: Secondary | ICD-10-CM | POA: Diagnosis not present

## 2020-02-28 DIAGNOSIS — M2578 Osteophyte, vertebrae: Secondary | ICD-10-CM | POA: Diagnosis not present

## 2020-02-28 DIAGNOSIS — K9041 Non-celiac gluten sensitivity: Secondary | ICD-10-CM | POA: Diagnosis not present

## 2020-02-28 DIAGNOSIS — H6983 Other specified disorders of Eustachian tube, bilateral: Secondary | ICD-10-CM | POA: Diagnosis not present

## 2020-02-28 DIAGNOSIS — R410 Disorientation, unspecified: Secondary | ICD-10-CM | POA: Diagnosis not present

## 2020-02-28 DIAGNOSIS — E1065 Type 1 diabetes mellitus with hyperglycemia: Secondary | ICD-10-CM | POA: Diagnosis not present

## 2020-02-28 DIAGNOSIS — J019 Acute sinusitis, unspecified: Secondary | ICD-10-CM | POA: Diagnosis not present

## 2020-02-28 DIAGNOSIS — I272 Pulmonary hypertension, unspecified: Secondary | ICD-10-CM | POA: Diagnosis not present

## 2020-02-28 DIAGNOSIS — E1169 Type 2 diabetes mellitus with other specified complication: Secondary | ICD-10-CM | POA: Diagnosis not present

## 2020-02-28 DIAGNOSIS — T63421D Toxic effect of venom of ants, accidental (unintentional), subsequent encounter: Secondary | ICD-10-CM | POA: Diagnosis not present

## 2020-02-28 DIAGNOSIS — H43811 Vitreous degeneration, right eye: Secondary | ICD-10-CM | POA: Diagnosis not present

## 2020-02-28 DIAGNOSIS — N62 Hypertrophy of breast: Secondary | ICD-10-CM | POA: Diagnosis not present

## 2020-02-28 DIAGNOSIS — Z125 Encounter for screening for malignant neoplasm of prostate: Secondary | ICD-10-CM | POA: Diagnosis not present

## 2020-02-28 DIAGNOSIS — M1990 Unspecified osteoarthritis, unspecified site: Secondary | ICD-10-CM | POA: Diagnosis not present

## 2020-02-28 DIAGNOSIS — R300 Dysuria: Secondary | ICD-10-CM | POA: Diagnosis not present

## 2020-02-28 DIAGNOSIS — S61209A Unspecified open wound of unspecified finger without damage to nail, initial encounter: Secondary | ICD-10-CM | POA: Diagnosis not present

## 2020-02-28 DIAGNOSIS — I4819 Other persistent atrial fibrillation: Secondary | ICD-10-CM | POA: Diagnosis not present

## 2020-02-28 DIAGNOSIS — S41111A Laceration without foreign body of right upper arm, initial encounter: Secondary | ICD-10-CM | POA: Diagnosis not present

## 2020-02-28 DIAGNOSIS — H1131 Conjunctival hemorrhage, right eye: Secondary | ICD-10-CM | POA: Diagnosis not present

## 2020-02-28 DIAGNOSIS — C884 Extranodal marginal zone B-cell lymphoma of mucosa-associated lymphoid tissue [MALT-lymphoma]: Secondary | ICD-10-CM | POA: Diagnosis not present

## 2020-02-28 DIAGNOSIS — I160 Hypertensive urgency: Secondary | ICD-10-CM | POA: Diagnosis not present

## 2020-02-28 DIAGNOSIS — S32009D Unspecified fracture of unspecified lumbar vertebra, subsequent encounter for fracture with routine healing: Secondary | ICD-10-CM | POA: Diagnosis not present

## 2020-02-28 DIAGNOSIS — M5431 Sciatica, right side: Secondary | ICD-10-CM | POA: Diagnosis not present

## 2020-02-28 DIAGNOSIS — N179 Acute kidney failure, unspecified: Secondary | ICD-10-CM | POA: Diagnosis not present

## 2020-02-28 DIAGNOSIS — Z87891 Personal history of nicotine dependence: Secondary | ICD-10-CM | POA: Diagnosis not present

## 2020-02-28 DIAGNOSIS — M898X6 Other specified disorders of bone, lower leg: Secondary | ICD-10-CM | POA: Diagnosis not present

## 2020-02-28 DIAGNOSIS — K50119 Crohn's disease of large intestine with unspecified complications: Secondary | ICD-10-CM | POA: Diagnosis not present

## 2020-02-28 DIAGNOSIS — E041 Nontoxic single thyroid nodule: Secondary | ICD-10-CM | POA: Diagnosis not present

## 2020-02-28 DIAGNOSIS — R351 Nocturia: Secondary | ICD-10-CM | POA: Diagnosis not present

## 2020-02-28 DIAGNOSIS — Z6827 Body mass index (BMI) 27.0-27.9, adult: Secondary | ICD-10-CM | POA: Diagnosis not present

## 2020-02-28 DIAGNOSIS — I5042 Chronic combined systolic (congestive) and diastolic (congestive) heart failure: Secondary | ICD-10-CM | POA: Diagnosis not present

## 2020-02-28 DIAGNOSIS — K529 Noninfective gastroenteritis and colitis, unspecified: Secondary | ICD-10-CM | POA: Diagnosis not present

## 2020-02-28 DIAGNOSIS — H35361 Drusen (degenerative) of macula, right eye: Secondary | ICD-10-CM | POA: Diagnosis not present

## 2020-02-28 DIAGNOSIS — E46 Unspecified protein-calorie malnutrition: Secondary | ICD-10-CM | POA: Diagnosis not present

## 2020-02-28 DIAGNOSIS — D518 Other vitamin B12 deficiency anemias: Secondary | ICD-10-CM | POA: Diagnosis not present

## 2020-02-28 DIAGNOSIS — L988 Other specified disorders of the skin and subcutaneous tissue: Secondary | ICD-10-CM | POA: Diagnosis not present

## 2020-02-28 DIAGNOSIS — Z950 Presence of cardiac pacemaker: Secondary | ICD-10-CM | POA: Diagnosis not present

## 2020-02-28 DIAGNOSIS — R0902 Hypoxemia: Secondary | ICD-10-CM | POA: Diagnosis not present

## 2020-02-28 DIAGNOSIS — H53483 Generalized contraction of visual field, bilateral: Secondary | ICD-10-CM | POA: Diagnosis not present

## 2020-02-28 DIAGNOSIS — N939 Abnormal uterine and vaginal bleeding, unspecified: Secondary | ICD-10-CM | POA: Diagnosis not present

## 2020-02-28 DIAGNOSIS — R519 Headache, unspecified: Secondary | ICD-10-CM | POA: Diagnosis not present

## 2020-02-28 DIAGNOSIS — D8940 Mast cell activation, unspecified: Secondary | ICD-10-CM | POA: Diagnosis not present

## 2020-02-28 DIAGNOSIS — N2889 Other specified disorders of kidney and ureter: Secondary | ICD-10-CM | POA: Diagnosis not present

## 2020-02-28 DIAGNOSIS — R109 Unspecified abdominal pain: Secondary | ICD-10-CM | POA: Diagnosis not present

## 2020-02-28 DIAGNOSIS — Q8501 Neurofibromatosis, type 1: Secondary | ICD-10-CM | POA: Diagnosis not present

## 2020-02-28 DIAGNOSIS — H401133 Primary open-angle glaucoma, bilateral, severe stage: Secondary | ICD-10-CM | POA: Diagnosis not present

## 2020-02-28 DIAGNOSIS — Z299 Encounter for prophylactic measures, unspecified: Secondary | ICD-10-CM | POA: Diagnosis not present

## 2020-02-28 DIAGNOSIS — Z881 Allergy status to other antibiotic agents status: Secondary | ICD-10-CM | POA: Diagnosis not present

## 2020-02-28 DIAGNOSIS — K651 Peritoneal abscess: Secondary | ICD-10-CM | POA: Diagnosis not present

## 2020-02-28 DIAGNOSIS — T8484XA Pain due to internal orthopedic prosthetic devices, implants and grafts, initial encounter: Secondary | ICD-10-CM | POA: Diagnosis not present

## 2020-02-28 DIAGNOSIS — L97312 Non-pressure chronic ulcer of right ankle with fat layer exposed: Secondary | ICD-10-CM | POA: Diagnosis not present

## 2020-02-28 DIAGNOSIS — I6522 Occlusion and stenosis of left carotid artery: Secondary | ICD-10-CM | POA: Diagnosis not present

## 2020-02-28 DIAGNOSIS — S46012A Strain of muscle(s) and tendon(s) of the rotator cuff of left shoulder, initial encounter: Secondary | ICD-10-CM | POA: Diagnosis not present

## 2020-02-28 DIAGNOSIS — S62102B Fracture of unspecified carpal bone, left wrist, initial encounter for open fracture: Secondary | ICD-10-CM | POA: Diagnosis not present

## 2020-02-28 DIAGNOSIS — H35371 Puckering of macula, right eye: Secondary | ICD-10-CM | POA: Diagnosis not present

## 2020-02-28 DIAGNOSIS — D692 Other nonthrombocytopenic purpura: Secondary | ICD-10-CM | POA: Diagnosis not present

## 2020-02-28 DIAGNOSIS — L821 Other seborrheic keratosis: Secondary | ICD-10-CM | POA: Diagnosis not present

## 2020-02-28 DIAGNOSIS — Z992 Dependence on renal dialysis: Secondary | ICD-10-CM | POA: Diagnosis not present

## 2020-02-28 DIAGNOSIS — S42022A Displaced fracture of shaft of left clavicle, initial encounter for closed fracture: Secondary | ICD-10-CM | POA: Diagnosis not present

## 2020-02-28 DIAGNOSIS — R319 Hematuria, unspecified: Secondary | ICD-10-CM | POA: Diagnosis not present

## 2020-02-28 DIAGNOSIS — R739 Hyperglycemia, unspecified: Secondary | ICD-10-CM | POA: Diagnosis not present

## 2020-02-28 DIAGNOSIS — Z9089 Acquired absence of other organs: Secondary | ICD-10-CM | POA: Diagnosis not present

## 2020-02-28 DIAGNOSIS — I447 Left bundle-branch block, unspecified: Secondary | ICD-10-CM | POA: Diagnosis not present

## 2020-02-28 DIAGNOSIS — D0439 Carcinoma in situ of skin of other parts of face: Secondary | ICD-10-CM | POA: Diagnosis not present

## 2020-02-28 DIAGNOSIS — M25461 Effusion, right knee: Secondary | ICD-10-CM | POA: Diagnosis not present

## 2020-02-28 DIAGNOSIS — N39498 Other specified urinary incontinence: Secondary | ICD-10-CM | POA: Diagnosis not present

## 2020-02-28 DIAGNOSIS — L03313 Cellulitis of chest wall: Secondary | ICD-10-CM | POA: Diagnosis not present

## 2020-02-28 DIAGNOSIS — M62830 Muscle spasm of back: Secondary | ICD-10-CM | POA: Diagnosis not present

## 2020-02-28 DIAGNOSIS — Z1151 Encounter for screening for human papillomavirus (HPV): Secondary | ICD-10-CM | POA: Diagnosis not present

## 2020-02-28 DIAGNOSIS — I240 Acute coronary thrombosis not resulting in myocardial infarction: Secondary | ICD-10-CM | POA: Diagnosis not present

## 2020-02-28 DIAGNOSIS — R001 Bradycardia, unspecified: Secondary | ICD-10-CM | POA: Diagnosis not present

## 2020-02-28 DIAGNOSIS — H1820 Unspecified corneal edema: Secondary | ICD-10-CM | POA: Diagnosis not present

## 2020-02-28 DIAGNOSIS — L0390 Cellulitis, unspecified: Secondary | ICD-10-CM | POA: Diagnosis not present

## 2020-02-28 DIAGNOSIS — C4321 Malignant melanoma of right ear and external auricular canal: Secondary | ICD-10-CM | POA: Diagnosis not present

## 2020-02-28 DIAGNOSIS — H401431 Capsular glaucoma with pseudoexfoliation of lens, bilateral, mild stage: Secondary | ICD-10-CM | POA: Diagnosis not present

## 2020-02-28 DIAGNOSIS — M4120 Other idiopathic scoliosis, site unspecified: Secondary | ICD-10-CM | POA: Diagnosis not present

## 2020-02-28 DIAGNOSIS — K921 Melena: Secondary | ICD-10-CM | POA: Diagnosis not present

## 2020-02-28 DIAGNOSIS — Z1231 Encounter for screening mammogram for malignant neoplasm of breast: Secondary | ICD-10-CM | POA: Diagnosis not present

## 2020-02-28 DIAGNOSIS — I7100 Dissection of unspecified site of aorta: Secondary | ICD-10-CM | POA: Diagnosis not present

## 2020-02-28 DIAGNOSIS — Z87898 Personal history of other specified conditions: Secondary | ICD-10-CM | POA: Diagnosis not present

## 2020-02-28 DIAGNOSIS — I878 Other specified disorders of veins: Secondary | ICD-10-CM | POA: Diagnosis not present

## 2020-02-28 DIAGNOSIS — R3121 Asymptomatic microscopic hematuria: Secondary | ICD-10-CM | POA: Diagnosis not present

## 2020-02-28 DIAGNOSIS — M0600 Rheumatoid arthritis without rheumatoid factor, unspecified site: Secondary | ICD-10-CM | POA: Diagnosis not present

## 2020-02-28 DIAGNOSIS — M79651 Pain in right thigh: Secondary | ICD-10-CM | POA: Diagnosis not present

## 2020-02-28 DIAGNOSIS — N2581 Secondary hyperparathyroidism of renal origin: Secondary | ICD-10-CM | POA: Diagnosis not present

## 2020-02-28 DIAGNOSIS — Z79891 Long term (current) use of opiate analgesic: Secondary | ICD-10-CM | POA: Diagnosis not present

## 2020-02-28 DIAGNOSIS — G44229 Chronic tension-type headache, not intractable: Secondary | ICD-10-CM | POA: Diagnosis not present

## 2020-02-28 DIAGNOSIS — R1032 Left lower quadrant pain: Secondary | ICD-10-CM | POA: Diagnosis not present

## 2020-02-28 DIAGNOSIS — K648 Other hemorrhoids: Secondary | ICD-10-CM | POA: Diagnosis not present

## 2020-02-28 DIAGNOSIS — Z8679 Personal history of other diseases of the circulatory system: Secondary | ICD-10-CM | POA: Diagnosis not present

## 2020-02-28 DIAGNOSIS — M542 Cervicalgia: Secondary | ICD-10-CM | POA: Diagnosis not present

## 2020-02-28 DIAGNOSIS — J069 Acute upper respiratory infection, unspecified: Secondary | ICD-10-CM | POA: Diagnosis not present

## 2020-02-28 DIAGNOSIS — E1122 Type 2 diabetes mellitus with diabetic chronic kidney disease: Secondary | ICD-10-CM | POA: Diagnosis not present

## 2020-02-28 DIAGNOSIS — G893 Neoplasm related pain (acute) (chronic): Secondary | ICD-10-CM | POA: Diagnosis not present

## 2020-02-28 DIAGNOSIS — Z6830 Body mass index (BMI) 30.0-30.9, adult: Secondary | ICD-10-CM | POA: Diagnosis not present

## 2020-02-28 DIAGNOSIS — H35721 Serous detachment of retinal pigment epithelium, right eye: Secondary | ICD-10-CM | POA: Diagnosis not present

## 2020-02-28 DIAGNOSIS — K2950 Unspecified chronic gastritis without bleeding: Secondary | ICD-10-CM | POA: Diagnosis not present

## 2020-02-28 DIAGNOSIS — I509 Heart failure, unspecified: Secondary | ICD-10-CM | POA: Diagnosis not present

## 2020-02-28 DIAGNOSIS — M4316 Spondylolisthesis, lumbar region: Secondary | ICD-10-CM | POA: Diagnosis not present

## 2020-02-28 DIAGNOSIS — H33199 Other retinoschisis and retinal cysts, unspecified eye: Secondary | ICD-10-CM | POA: Diagnosis not present

## 2020-02-28 DIAGNOSIS — C8599 Non-Hodgkin lymphoma, unspecified, extranodal and solid organ sites: Secondary | ICD-10-CM | POA: Diagnosis not present

## 2020-02-28 DIAGNOSIS — D472 Monoclonal gammopathy: Secondary | ICD-10-CM | POA: Diagnosis not present

## 2020-02-28 DIAGNOSIS — Z716 Tobacco abuse counseling: Secondary | ICD-10-CM | POA: Diagnosis not present

## 2020-02-28 DIAGNOSIS — M8949 Other hypertrophic osteoarthropathy, multiple sites: Secondary | ICD-10-CM | POA: Diagnosis not present

## 2020-02-28 DIAGNOSIS — M6281 Muscle weakness (generalized): Secondary | ICD-10-CM | POA: Diagnosis not present

## 2020-02-28 DIAGNOSIS — R7309 Other abnormal glucose: Secondary | ICD-10-CM | POA: Diagnosis not present

## 2020-02-28 DIAGNOSIS — S32009S Unspecified fracture of unspecified lumbar vertebra, sequela: Secondary | ICD-10-CM | POA: Diagnosis not present

## 2020-02-28 DIAGNOSIS — R1310 Dysphagia, unspecified: Secondary | ICD-10-CM | POA: Diagnosis not present

## 2020-02-28 DIAGNOSIS — I5033 Acute on chronic diastolic (congestive) heart failure: Secondary | ICD-10-CM | POA: Diagnosis not present

## 2020-02-28 DIAGNOSIS — J9611 Chronic respiratory failure with hypoxia: Secondary | ICD-10-CM | POA: Diagnosis not present

## 2020-02-28 DIAGNOSIS — D1800 Hemangioma unspecified site: Secondary | ICD-10-CM | POA: Diagnosis not present

## 2020-02-28 DIAGNOSIS — G5600 Carpal tunnel syndrome, unspecified upper limb: Secondary | ICD-10-CM | POA: Diagnosis not present

## 2020-02-28 DIAGNOSIS — Z0389 Encounter for observation for other suspected diseases and conditions ruled out: Secondary | ICD-10-CM | POA: Diagnosis not present

## 2020-02-28 DIAGNOSIS — N1832 Chronic kidney disease, stage 3b: Secondary | ICD-10-CM | POA: Diagnosis not present

## 2020-02-28 DIAGNOSIS — M25551 Pain in right hip: Secondary | ICD-10-CM | POA: Diagnosis not present

## 2020-02-28 DIAGNOSIS — E11621 Type 2 diabetes mellitus with foot ulcer: Secondary | ICD-10-CM | POA: Diagnosis not present

## 2020-02-28 DIAGNOSIS — G4730 Sleep apnea, unspecified: Secondary | ICD-10-CM | POA: Diagnosis not present

## 2020-02-28 DIAGNOSIS — R10819 Abdominal tenderness, unspecified site: Secondary | ICD-10-CM | POA: Diagnosis not present

## 2020-02-28 DIAGNOSIS — S0081XA Abrasion of other part of head, initial encounter: Secondary | ICD-10-CM | POA: Diagnosis not present

## 2020-02-28 DIAGNOSIS — N041 Nephrotic syndrome with focal and segmental glomerular lesions: Secondary | ICD-10-CM | POA: Diagnosis not present

## 2020-02-28 DIAGNOSIS — K5909 Other constipation: Secondary | ICD-10-CM | POA: Diagnosis not present

## 2020-02-28 DIAGNOSIS — M25661 Stiffness of right knee, not elsewhere classified: Secondary | ICD-10-CM | POA: Diagnosis not present

## 2020-02-28 DIAGNOSIS — I87339 Chronic venous hypertension (idiopathic) with ulcer and inflammation of unspecified lower extremity: Secondary | ICD-10-CM | POA: Diagnosis not present

## 2020-02-28 DIAGNOSIS — M5126 Other intervertebral disc displacement, lumbar region: Secondary | ICD-10-CM | POA: Diagnosis not present

## 2020-02-28 DIAGNOSIS — R4701 Aphasia: Secondary | ICD-10-CM | POA: Diagnosis not present

## 2020-02-28 DIAGNOSIS — L600 Ingrowing nail: Secondary | ICD-10-CM | POA: Diagnosis not present

## 2020-02-28 DIAGNOSIS — H6123 Impacted cerumen, bilateral: Secondary | ICD-10-CM | POA: Diagnosis not present

## 2020-02-28 DIAGNOSIS — M21622 Bunionette of left foot: Secondary | ICD-10-CM | POA: Diagnosis not present

## 2020-02-28 DIAGNOSIS — R93422 Abnormal radiologic findings on diagnostic imaging of left kidney: Secondary | ICD-10-CM | POA: Diagnosis not present

## 2020-02-28 DIAGNOSIS — M65321 Trigger finger, right index finger: Secondary | ICD-10-CM | POA: Diagnosis not present

## 2020-02-28 DIAGNOSIS — D6481 Anemia due to antineoplastic chemotherapy: Secondary | ICD-10-CM | POA: Diagnosis not present

## 2020-02-28 DIAGNOSIS — Z1329 Encounter for screening for other suspected endocrine disorder: Secondary | ICD-10-CM | POA: Diagnosis not present

## 2020-02-28 DIAGNOSIS — G479 Sleep disorder, unspecified: Secondary | ICD-10-CM | POA: Diagnosis not present

## 2020-02-28 DIAGNOSIS — M47892 Other spondylosis, cervical region: Secondary | ICD-10-CM | POA: Diagnosis not present

## 2020-02-28 DIAGNOSIS — Z6825 Body mass index (BMI) 25.0-25.9, adult: Secondary | ICD-10-CM | POA: Diagnosis not present

## 2020-02-28 DIAGNOSIS — Z6828 Body mass index (BMI) 28.0-28.9, adult: Secondary | ICD-10-CM | POA: Diagnosis not present

## 2020-02-28 DIAGNOSIS — Z9689 Presence of other specified functional implants: Secondary | ICD-10-CM | POA: Diagnosis not present

## 2020-02-28 DIAGNOSIS — E113591 Type 2 diabetes mellitus with proliferative diabetic retinopathy without macular edema, right eye: Secondary | ICD-10-CM | POA: Diagnosis not present

## 2020-02-28 DIAGNOSIS — L309 Dermatitis, unspecified: Secondary | ICD-10-CM | POA: Diagnosis not present

## 2020-02-28 DIAGNOSIS — C4371 Malignant melanoma of right lower limb, including hip: Secondary | ICD-10-CM | POA: Diagnosis not present

## 2020-02-28 DIAGNOSIS — S46011D Strain of muscle(s) and tendon(s) of the rotator cuff of right shoulder, subsequent encounter: Secondary | ICD-10-CM | POA: Diagnosis not present

## 2020-02-28 DIAGNOSIS — I130 Hypertensive heart and chronic kidney disease with heart failure and stage 1 through stage 4 chronic kidney disease, or unspecified chronic kidney disease: Secondary | ICD-10-CM | POA: Diagnosis not present

## 2020-02-28 DIAGNOSIS — C774 Secondary and unspecified malignant neoplasm of inguinal and lower limb lymph nodes: Secondary | ICD-10-CM | POA: Diagnosis not present

## 2020-02-28 DIAGNOSIS — S0083XA Contusion of other part of head, initial encounter: Secondary | ICD-10-CM | POA: Diagnosis not present

## 2020-02-28 DIAGNOSIS — J986 Disorders of diaphragm: Secondary | ICD-10-CM | POA: Diagnosis not present

## 2020-02-28 DIAGNOSIS — I639 Cerebral infarction, unspecified: Secondary | ICD-10-CM | POA: Diagnosis not present

## 2020-02-28 DIAGNOSIS — D4102 Neoplasm of uncertain behavior of left kidney: Secondary | ICD-10-CM | POA: Diagnosis not present

## 2020-02-28 DIAGNOSIS — M85852 Other specified disorders of bone density and structure, left thigh: Secondary | ICD-10-CM | POA: Diagnosis not present

## 2020-02-28 DIAGNOSIS — Z884 Allergy status to anesthetic agent status: Secondary | ICD-10-CM | POA: Diagnosis not present

## 2020-02-28 DIAGNOSIS — Z6821 Body mass index (BMI) 21.0-21.9, adult: Secondary | ICD-10-CM | POA: Diagnosis not present

## 2020-02-28 DIAGNOSIS — Z961 Presence of intraocular lens: Secondary | ICD-10-CM | POA: Diagnosis not present

## 2020-02-28 DIAGNOSIS — C4441 Basal cell carcinoma of skin of scalp and neck: Secondary | ICD-10-CM | POA: Diagnosis not present

## 2020-02-28 DIAGNOSIS — Z6824 Body mass index (BMI) 24.0-24.9, adult: Secondary | ICD-10-CM | POA: Diagnosis not present

## 2020-02-28 DIAGNOSIS — Z8041 Family history of malignant neoplasm of ovary: Secondary | ICD-10-CM | POA: Diagnosis not present

## 2020-02-28 DIAGNOSIS — L089 Local infection of the skin and subcutaneous tissue, unspecified: Secondary | ICD-10-CM | POA: Diagnosis not present

## 2020-02-28 DIAGNOSIS — N952 Postmenopausal atrophic vaginitis: Secondary | ICD-10-CM | POA: Diagnosis not present

## 2020-02-28 DIAGNOSIS — G20 Parkinson's disease: Secondary | ICD-10-CM | POA: Diagnosis not present

## 2020-02-28 DIAGNOSIS — I825Z2 Chronic embolism and thrombosis of unspecified deep veins of left distal lower extremity: Secondary | ICD-10-CM | POA: Diagnosis not present

## 2020-02-28 DIAGNOSIS — R195 Other fecal abnormalities: Secondary | ICD-10-CM | POA: Diagnosis not present

## 2020-02-28 DIAGNOSIS — I482 Chronic atrial fibrillation, unspecified: Secondary | ICD-10-CM | POA: Diagnosis not present

## 2020-02-28 DIAGNOSIS — I714 Abdominal aortic aneurysm, without rupture: Secondary | ICD-10-CM | POA: Diagnosis not present

## 2020-02-28 DIAGNOSIS — M5415 Radiculopathy, thoracolumbar region: Secondary | ICD-10-CM | POA: Diagnosis not present

## 2020-02-28 DIAGNOSIS — M7711 Lateral epicondylitis, right elbow: Secondary | ICD-10-CM | POA: Diagnosis not present

## 2020-02-28 DIAGNOSIS — M9905 Segmental and somatic dysfunction of pelvic region: Secondary | ICD-10-CM | POA: Diagnosis not present

## 2020-02-28 DIAGNOSIS — M4716 Other spondylosis with myelopathy, lumbar region: Secondary | ICD-10-CM | POA: Diagnosis not present

## 2020-02-28 DIAGNOSIS — R6521 Severe sepsis with septic shock: Secondary | ICD-10-CM | POA: Diagnosis not present

## 2020-02-28 DIAGNOSIS — I083 Combined rheumatic disorders of mitral, aortic and tricuspid valves: Secondary | ICD-10-CM | POA: Diagnosis not present

## 2020-02-28 DIAGNOSIS — H43393 Other vitreous opacities, bilateral: Secondary | ICD-10-CM | POA: Diagnosis not present

## 2020-02-28 DIAGNOSIS — C50512 Malignant neoplasm of lower-outer quadrant of left female breast: Secondary | ICD-10-CM | POA: Diagnosis not present

## 2020-02-28 DIAGNOSIS — F209 Schizophrenia, unspecified: Secondary | ICD-10-CM | POA: Diagnosis not present

## 2020-02-28 DIAGNOSIS — A609 Anogenital herpesviral infection, unspecified: Secondary | ICD-10-CM | POA: Diagnosis not present

## 2020-02-28 DIAGNOSIS — I70221 Atherosclerosis of native arteries of extremities with rest pain, right leg: Secondary | ICD-10-CM | POA: Diagnosis not present

## 2020-02-28 DIAGNOSIS — I6930 Unspecified sequelae of cerebral infarction: Secondary | ICD-10-CM | POA: Diagnosis not present

## 2020-02-28 DIAGNOSIS — C7951 Secondary malignant neoplasm of bone: Secondary | ICD-10-CM | POA: Diagnosis not present

## 2020-02-28 DIAGNOSIS — S32592S Other specified fracture of left pubis, sequela: Secondary | ICD-10-CM | POA: Diagnosis not present

## 2020-02-28 DIAGNOSIS — K435 Parastomal hernia without obstruction or  gangrene: Secondary | ICD-10-CM | POA: Diagnosis not present

## 2020-02-28 DIAGNOSIS — Z720 Tobacco use: Secondary | ICD-10-CM | POA: Diagnosis not present

## 2020-02-28 DIAGNOSIS — G3184 Mild cognitive impairment, so stated: Secondary | ICD-10-CM | POA: Diagnosis not present

## 2020-02-28 DIAGNOSIS — F3342 Major depressive disorder, recurrent, in full remission: Secondary | ICD-10-CM | POA: Diagnosis not present

## 2020-02-28 DIAGNOSIS — C569 Malignant neoplasm of unspecified ovary: Secondary | ICD-10-CM | POA: Diagnosis not present

## 2020-02-28 DIAGNOSIS — F0150 Vascular dementia without behavioral disturbance: Secondary | ICD-10-CM | POA: Diagnosis not present

## 2020-02-28 DIAGNOSIS — L97509 Non-pressure chronic ulcer of other part of unspecified foot with unspecified severity: Secondary | ICD-10-CM | POA: Diagnosis not present

## 2020-02-28 DIAGNOSIS — C9210 Chronic myeloid leukemia, BCR/ABL-positive, not having achieved remission: Secondary | ICD-10-CM | POA: Diagnosis not present

## 2020-02-28 DIAGNOSIS — Z85118 Personal history of other malignant neoplasm of bronchus and lung: Secondary | ICD-10-CM | POA: Diagnosis not present

## 2020-02-28 DIAGNOSIS — M8588 Other specified disorders of bone density and structure, other site: Secondary | ICD-10-CM | POA: Diagnosis not present

## 2020-02-28 DIAGNOSIS — Y999 Unspecified external cause status: Secondary | ICD-10-CM | POA: Diagnosis not present

## 2020-02-28 DIAGNOSIS — J8417 Interstitial lung disease with progressive fibrotic phenotype in diseases classified elsewhere: Secondary | ICD-10-CM | POA: Diagnosis not present

## 2020-02-28 DIAGNOSIS — H40013 Open angle with borderline findings, low risk, bilateral: Secondary | ICD-10-CM | POA: Diagnosis not present

## 2020-02-28 DIAGNOSIS — I454 Nonspecific intraventricular block: Secondary | ICD-10-CM | POA: Diagnosis not present

## 2020-02-28 DIAGNOSIS — H57813 Brow ptosis, bilateral: Secondary | ICD-10-CM | POA: Diagnosis not present

## 2020-02-28 DIAGNOSIS — Z7902 Long term (current) use of antithrombotics/antiplatelets: Secondary | ICD-10-CM | POA: Diagnosis not present

## 2020-02-28 DIAGNOSIS — S61552A Open bite of left wrist, initial encounter: Secondary | ICD-10-CM | POA: Diagnosis not present

## 2020-02-28 DIAGNOSIS — G9341 Metabolic encephalopathy: Secondary | ICD-10-CM | POA: Diagnosis not present

## 2020-02-28 DIAGNOSIS — S2242XD Multiple fractures of ribs, left side, subsequent encounter for fracture with routine healing: Secondary | ICD-10-CM | POA: Diagnosis not present

## 2020-02-28 DIAGNOSIS — L408 Other psoriasis: Secondary | ICD-10-CM | POA: Diagnosis not present

## 2020-02-28 DIAGNOSIS — L98499 Non-pressure chronic ulcer of skin of other sites with unspecified severity: Secondary | ICD-10-CM | POA: Diagnosis not present

## 2020-02-28 DIAGNOSIS — S61451S Open bite of right hand, sequela: Secondary | ICD-10-CM | POA: Diagnosis not present

## 2020-02-28 DIAGNOSIS — M25474 Effusion, right foot: Secondary | ICD-10-CM | POA: Diagnosis not present

## 2020-02-28 DIAGNOSIS — K5904 Chronic idiopathic constipation: Secondary | ICD-10-CM | POA: Diagnosis not present

## 2020-02-28 DIAGNOSIS — M79609 Pain in unspecified limb: Secondary | ICD-10-CM | POA: Diagnosis not present

## 2020-02-28 DIAGNOSIS — J342 Deviated nasal septum: Secondary | ICD-10-CM | POA: Diagnosis not present

## 2020-02-28 DIAGNOSIS — H25011 Cortical age-related cataract, right eye: Secondary | ICD-10-CM | POA: Diagnosis not present

## 2020-02-28 DIAGNOSIS — I672 Cerebral atherosclerosis: Secondary | ICD-10-CM | POA: Diagnosis not present

## 2020-02-28 DIAGNOSIS — H353121 Nonexudative age-related macular degeneration, left eye, early dry stage: Secondary | ICD-10-CM | POA: Diagnosis not present

## 2020-02-28 DIAGNOSIS — K2270 Barrett's esophagus without dysplasia: Secondary | ICD-10-CM | POA: Diagnosis not present

## 2020-02-28 DIAGNOSIS — E789 Disorder of lipoprotein metabolism, unspecified: Secondary | ICD-10-CM | POA: Diagnosis not present

## 2020-02-28 DIAGNOSIS — M75101 Unspecified rotator cuff tear or rupture of right shoulder, not specified as traumatic: Secondary | ICD-10-CM | POA: Diagnosis not present

## 2020-02-28 DIAGNOSIS — M961 Postlaminectomy syndrome, not elsewhere classified: Secondary | ICD-10-CM | POA: Diagnosis not present

## 2020-02-28 DIAGNOSIS — H16232 Neurotrophic keratoconjunctivitis, left eye: Secondary | ICD-10-CM | POA: Diagnosis not present

## 2020-02-28 DIAGNOSIS — M461 Sacroiliitis, not elsewhere classified: Secondary | ICD-10-CM | POA: Diagnosis not present

## 2020-02-28 DIAGNOSIS — Z6839 Body mass index (BMI) 39.0-39.9, adult: Secondary | ICD-10-CM | POA: Diagnosis not present

## 2020-02-28 DIAGNOSIS — C3431 Malignant neoplasm of lower lobe, right bronchus or lung: Secondary | ICD-10-CM | POA: Diagnosis not present

## 2020-02-28 DIAGNOSIS — Z7401 Bed confinement status: Secondary | ICD-10-CM | POA: Diagnosis not present

## 2020-02-28 DIAGNOSIS — M4186 Other forms of scoliosis, lumbar region: Secondary | ICD-10-CM | POA: Diagnosis not present

## 2020-02-28 DIAGNOSIS — R609 Edema, unspecified: Secondary | ICD-10-CM | POA: Diagnosis not present

## 2020-02-28 DIAGNOSIS — H401113 Primary open-angle glaucoma, right eye, severe stage: Secondary | ICD-10-CM | POA: Diagnosis not present

## 2020-02-28 DIAGNOSIS — H9319 Tinnitus, unspecified ear: Secondary | ICD-10-CM | POA: Diagnosis not present

## 2020-02-28 DIAGNOSIS — E113312 Type 2 diabetes mellitus with moderate nonproliferative diabetic retinopathy with macular edema, left eye: Secondary | ICD-10-CM | POA: Diagnosis not present

## 2020-02-28 DIAGNOSIS — D6859 Other primary thrombophilia: Secondary | ICD-10-CM | POA: Diagnosis not present

## 2020-02-28 DIAGNOSIS — D696 Thrombocytopenia, unspecified: Secondary | ICD-10-CM | POA: Diagnosis not present

## 2020-02-28 DIAGNOSIS — H66012 Acute suppurative otitis media with spontaneous rupture of ear drum, left ear: Secondary | ICD-10-CM | POA: Diagnosis not present

## 2020-02-28 DIAGNOSIS — G629 Polyneuropathy, unspecified: Secondary | ICD-10-CM | POA: Diagnosis not present

## 2020-02-28 DIAGNOSIS — N200 Calculus of kidney: Secondary | ICD-10-CM | POA: Diagnosis not present

## 2020-02-28 DIAGNOSIS — H402290 Chronic angle-closure glaucoma, unspecified eye, stage unspecified: Secondary | ICD-10-CM | POA: Diagnosis not present

## 2020-02-28 DIAGNOSIS — S8264XA Nondisplaced fracture of lateral malleolus of right fibula, initial encounter for closed fracture: Secondary | ICD-10-CM | POA: Diagnosis not present

## 2020-02-28 DIAGNOSIS — S43422A Sprain of left rotator cuff capsule, initial encounter: Secondary | ICD-10-CM | POA: Diagnosis not present

## 2020-02-28 DIAGNOSIS — E8779 Other fluid overload: Secondary | ICD-10-CM | POA: Diagnosis not present

## 2020-02-28 DIAGNOSIS — K5780 Diverticulitis of intestine, part unspecified, with perforation and abscess without bleeding: Secondary | ICD-10-CM | POA: Diagnosis not present

## 2020-02-28 DIAGNOSIS — I872 Venous insufficiency (chronic) (peripheral): Secondary | ICD-10-CM | POA: Diagnosis not present

## 2020-02-28 DIAGNOSIS — S60221A Contusion of right hand, initial encounter: Secondary | ICD-10-CM | POA: Diagnosis not present

## 2020-02-28 DIAGNOSIS — I69328 Other speech and language deficits following cerebral infarction: Secondary | ICD-10-CM | POA: Diagnosis not present

## 2020-02-28 DIAGNOSIS — N1330 Unspecified hydronephrosis: Secondary | ICD-10-CM | POA: Diagnosis not present

## 2020-02-28 DIAGNOSIS — R52 Pain, unspecified: Secondary | ICD-10-CM | POA: Diagnosis not present

## 2020-02-28 DIAGNOSIS — D0512 Intraductal carcinoma in situ of left breast: Secondary | ICD-10-CM | POA: Diagnosis not present

## 2020-02-28 DIAGNOSIS — M5441 Lumbago with sciatica, right side: Secondary | ICD-10-CM | POA: Diagnosis not present

## 2020-02-28 DIAGNOSIS — H0100B Unspecified blepharitis left eye, upper and lower eyelids: Secondary | ICD-10-CM | POA: Diagnosis not present

## 2020-02-28 DIAGNOSIS — Z955 Presence of coronary angioplasty implant and graft: Secondary | ICD-10-CM | POA: Diagnosis not present

## 2020-02-28 DIAGNOSIS — M9904 Segmental and somatic dysfunction of sacral region: Secondary | ICD-10-CM | POA: Diagnosis not present

## 2020-02-28 DIAGNOSIS — R911 Solitary pulmonary nodule: Secondary | ICD-10-CM | POA: Diagnosis not present

## 2020-02-28 DIAGNOSIS — M533 Sacrococcygeal disorders, not elsewhere classified: Secondary | ICD-10-CM | POA: Diagnosis not present

## 2020-02-28 DIAGNOSIS — R7301 Impaired fasting glucose: Secondary | ICD-10-CM | POA: Diagnosis not present

## 2020-02-28 DIAGNOSIS — L720 Epidermal cyst: Secondary | ICD-10-CM | POA: Diagnosis not present

## 2020-02-28 DIAGNOSIS — M1180 Other specified crystal arthropathies, unspecified site: Secondary | ICD-10-CM | POA: Diagnosis not present

## 2020-02-28 DIAGNOSIS — D84821 Immunodeficiency due to drugs: Secondary | ICD-10-CM | POA: Diagnosis not present

## 2020-02-28 DIAGNOSIS — F5101 Primary insomnia: Secondary | ICD-10-CM | POA: Diagnosis not present

## 2020-02-28 DIAGNOSIS — M5442 Lumbago with sciatica, left side: Secondary | ICD-10-CM | POA: Diagnosis not present

## 2020-02-28 DIAGNOSIS — F17200 Nicotine dependence, unspecified, uncomplicated: Secondary | ICD-10-CM | POA: Diagnosis not present

## 2020-02-28 DIAGNOSIS — N342 Other urethritis: Secondary | ICD-10-CM | POA: Diagnosis not present

## 2020-02-28 DIAGNOSIS — L570 Actinic keratosis: Secondary | ICD-10-CM | POA: Diagnosis not present

## 2020-02-28 DIAGNOSIS — H409 Unspecified glaucoma: Secondary | ICD-10-CM | POA: Diagnosis not present

## 2020-02-28 DIAGNOSIS — E890 Postprocedural hypothyroidism: Secondary | ICD-10-CM | POA: Diagnosis not present

## 2020-02-28 DIAGNOSIS — H5371 Glare sensitivity: Secondary | ICD-10-CM | POA: Diagnosis not present

## 2020-02-28 DIAGNOSIS — M79601 Pain in right arm: Secondary | ICD-10-CM | POA: Diagnosis not present

## 2020-02-28 DIAGNOSIS — M25561 Pain in right knee: Secondary | ICD-10-CM | POA: Diagnosis not present

## 2020-02-28 DIAGNOSIS — Z9861 Coronary angioplasty status: Secondary | ICD-10-CM | POA: Diagnosis not present

## 2020-02-28 DIAGNOSIS — H2512 Age-related nuclear cataract, left eye: Secondary | ICD-10-CM | POA: Diagnosis not present

## 2020-02-28 DIAGNOSIS — M7072 Other bursitis of hip, left hip: Secondary | ICD-10-CM | POA: Diagnosis not present

## 2020-02-28 DIAGNOSIS — R0789 Other chest pain: Secondary | ICD-10-CM | POA: Diagnosis not present

## 2020-02-28 DIAGNOSIS — R41841 Cognitive communication deficit: Secondary | ICD-10-CM | POA: Diagnosis not present

## 2020-02-28 DIAGNOSIS — N136 Pyonephrosis: Secondary | ICD-10-CM | POA: Diagnosis not present

## 2020-02-28 DIAGNOSIS — F17220 Nicotine dependence, chewing tobacco, uncomplicated: Secondary | ICD-10-CM | POA: Diagnosis not present

## 2020-02-28 DIAGNOSIS — H52201 Unspecified astigmatism, right eye: Secondary | ICD-10-CM | POA: Diagnosis not present

## 2020-02-28 DIAGNOSIS — N401 Enlarged prostate with lower urinary tract symptoms: Secondary | ICD-10-CM | POA: Diagnosis not present

## 2020-02-28 DIAGNOSIS — H18831 Recurrent erosion of cornea, right eye: Secondary | ICD-10-CM | POA: Diagnosis not present

## 2020-02-28 DIAGNOSIS — K915 Postcholecystectomy syndrome: Secondary | ICD-10-CM | POA: Diagnosis not present

## 2020-02-28 DIAGNOSIS — N6310 Unspecified lump in the right breast, unspecified quadrant: Secondary | ICD-10-CM | POA: Diagnosis not present

## 2020-02-28 DIAGNOSIS — I959 Hypotension, unspecified: Secondary | ICD-10-CM | POA: Diagnosis not present

## 2020-02-28 DIAGNOSIS — R7303 Prediabetes: Secondary | ICD-10-CM | POA: Diagnosis not present

## 2020-02-28 DIAGNOSIS — S60812A Abrasion of left wrist, initial encounter: Secondary | ICD-10-CM | POA: Diagnosis not present

## 2020-02-28 DIAGNOSIS — Z298 Encounter for other specified prophylactic measures: Secondary | ICD-10-CM | POA: Diagnosis not present

## 2020-02-28 DIAGNOSIS — H11153 Pinguecula, bilateral: Secondary | ICD-10-CM | POA: Diagnosis not present

## 2020-02-28 DIAGNOSIS — M0579 Rheumatoid arthritis with rheumatoid factor of multiple sites without organ or systems involvement: Secondary | ICD-10-CM | POA: Diagnosis not present

## 2020-02-28 DIAGNOSIS — I341 Nonrheumatic mitral (valve) prolapse: Secondary | ICD-10-CM | POA: Diagnosis not present

## 2020-02-28 DIAGNOSIS — J449 Chronic obstructive pulmonary disease, unspecified: Secondary | ICD-10-CM | POA: Diagnosis not present

## 2020-02-28 DIAGNOSIS — Z23 Encounter for immunization: Secondary | ICD-10-CM | POA: Diagnosis not present

## 2020-02-28 DIAGNOSIS — R0600 Dyspnea, unspecified: Secondary | ICD-10-CM | POA: Diagnosis not present

## 2020-02-28 DIAGNOSIS — J302 Other seasonal allergic rhinitis: Secondary | ICD-10-CM | POA: Diagnosis not present

## 2020-02-28 DIAGNOSIS — F909 Attention-deficit hyperactivity disorder, unspecified type: Secondary | ICD-10-CM | POA: Diagnosis not present

## 2020-02-28 DIAGNOSIS — M659 Synovitis and tenosynovitis, unspecified: Secondary | ICD-10-CM | POA: Diagnosis not present

## 2020-02-28 DIAGNOSIS — H35342 Macular cyst, hole, or pseudohole, left eye: Secondary | ICD-10-CM | POA: Diagnosis not present

## 2020-02-28 DIAGNOSIS — K640 First degree hemorrhoids: Secondary | ICD-10-CM | POA: Diagnosis not present

## 2020-02-28 DIAGNOSIS — M4306 Spondylolysis, lumbar region: Secondary | ICD-10-CM | POA: Diagnosis not present

## 2020-02-28 DIAGNOSIS — C4372 Malignant melanoma of left lower limb, including hip: Secondary | ICD-10-CM | POA: Diagnosis not present

## 2020-02-28 DIAGNOSIS — Z1152 Encounter for screening for COVID-19: Secondary | ICD-10-CM | POA: Diagnosis not present

## 2020-02-28 DIAGNOSIS — E211 Secondary hyperparathyroidism, not elsewhere classified: Secondary | ICD-10-CM | POA: Diagnosis not present

## 2020-02-28 DIAGNOSIS — Z8619 Personal history of other infectious and parasitic diseases: Secondary | ICD-10-CM | POA: Diagnosis not present

## 2020-02-28 DIAGNOSIS — G939 Disorder of brain, unspecified: Secondary | ICD-10-CM | POA: Diagnosis not present

## 2020-02-28 DIAGNOSIS — Z9889 Other specified postprocedural states: Secondary | ICD-10-CM | POA: Diagnosis not present

## 2020-02-28 DIAGNOSIS — M79662 Pain in left lower leg: Secondary | ICD-10-CM | POA: Diagnosis not present

## 2020-02-28 DIAGNOSIS — R54 Age-related physical debility: Secondary | ICD-10-CM | POA: Diagnosis not present

## 2020-02-28 DIAGNOSIS — Z6820 Body mass index (BMI) 20.0-20.9, adult: Secondary | ICD-10-CM | POA: Diagnosis not present

## 2020-02-28 DIAGNOSIS — E034 Atrophy of thyroid (acquired): Secondary | ICD-10-CM | POA: Diagnosis not present

## 2020-02-28 DIAGNOSIS — C7800 Secondary malignant neoplasm of unspecified lung: Secondary | ICD-10-CM | POA: Diagnosis not present

## 2020-02-28 DIAGNOSIS — Z681 Body mass index (BMI) 19 or less, adult: Secondary | ICD-10-CM | POA: Diagnosis not present

## 2020-02-28 DIAGNOSIS — K225 Diverticulum of esophagus, acquired: Secondary | ICD-10-CM | POA: Diagnosis not present

## 2020-02-28 DIAGNOSIS — D171 Benign lipomatous neoplasm of skin and subcutaneous tissue of trunk: Secondary | ICD-10-CM | POA: Diagnosis not present

## 2020-02-28 DIAGNOSIS — M65351 Trigger finger, right little finger: Secondary | ICD-10-CM | POA: Diagnosis not present

## 2020-02-28 DIAGNOSIS — S82891A Other fracture of right lower leg, initial encounter for closed fracture: Secondary | ICD-10-CM | POA: Diagnosis not present

## 2020-02-28 DIAGNOSIS — H9313 Tinnitus, bilateral: Secondary | ICD-10-CM | POA: Diagnosis not present

## 2020-02-28 DIAGNOSIS — N6489 Other specified disorders of breast: Secondary | ICD-10-CM | POA: Diagnosis not present

## 2020-02-28 DIAGNOSIS — M5387 Other specified dorsopathies, lumbosacral region: Secondary | ICD-10-CM | POA: Diagnosis not present

## 2020-02-28 DIAGNOSIS — G250 Essential tremor: Secondary | ICD-10-CM | POA: Diagnosis not present

## 2020-02-28 DIAGNOSIS — Z8739 Personal history of other diseases of the musculoskeletal system and connective tissue: Secondary | ICD-10-CM | POA: Diagnosis not present

## 2020-02-28 DIAGNOSIS — J3089 Other allergic rhinitis: Secondary | ICD-10-CM | POA: Diagnosis not present

## 2020-02-28 DIAGNOSIS — J324 Chronic pansinusitis: Secondary | ICD-10-CM | POA: Diagnosis not present

## 2020-02-28 DIAGNOSIS — H26491 Other secondary cataract, right eye: Secondary | ICD-10-CM | POA: Diagnosis not present

## 2020-02-28 DIAGNOSIS — F4320 Adjustment disorder, unspecified: Secondary | ICD-10-CM | POA: Diagnosis not present

## 2020-02-28 DIAGNOSIS — S199XXA Unspecified injury of neck, initial encounter: Secondary | ICD-10-CM | POA: Diagnosis not present

## 2020-02-28 DIAGNOSIS — F329 Major depressive disorder, single episode, unspecified: Secondary | ICD-10-CM | POA: Diagnosis not present

## 2020-02-28 DIAGNOSIS — R945 Abnormal results of liver function studies: Secondary | ICD-10-CM | POA: Diagnosis not present

## 2020-02-28 DIAGNOSIS — F0281 Dementia in other diseases classified elsewhere with behavioral disturbance: Secondary | ICD-10-CM | POA: Diagnosis not present

## 2020-02-28 DIAGNOSIS — I071 Rheumatic tricuspid insufficiency: Secondary | ICD-10-CM | POA: Diagnosis not present

## 2020-02-28 DIAGNOSIS — D869 Sarcoidosis, unspecified: Secondary | ICD-10-CM | POA: Diagnosis not present

## 2020-02-28 DIAGNOSIS — M2040 Other hammer toe(s) (acquired), unspecified foot: Secondary | ICD-10-CM | POA: Diagnosis not present

## 2020-02-28 DIAGNOSIS — J9621 Acute and chronic respiratory failure with hypoxia: Secondary | ICD-10-CM | POA: Diagnosis not present

## 2020-02-28 DIAGNOSIS — H25813 Combined forms of age-related cataract, bilateral: Secondary | ICD-10-CM | POA: Diagnosis not present

## 2020-02-28 DIAGNOSIS — I8393 Asymptomatic varicose veins of bilateral lower extremities: Secondary | ICD-10-CM | POA: Diagnosis not present

## 2020-02-28 DIAGNOSIS — H401132 Primary open-angle glaucoma, bilateral, moderate stage: Secondary | ICD-10-CM | POA: Diagnosis not present

## 2020-02-28 DIAGNOSIS — F1410 Cocaine abuse, uncomplicated: Secondary | ICD-10-CM | POA: Diagnosis not present

## 2020-02-28 DIAGNOSIS — I350 Nonrheumatic aortic (valve) stenosis: Secondary | ICD-10-CM | POA: Diagnosis not present

## 2020-02-28 DIAGNOSIS — Z524 Kidney donor: Secondary | ICD-10-CM | POA: Diagnosis not present

## 2020-02-28 DIAGNOSIS — J869 Pyothorax without fistula: Secondary | ICD-10-CM | POA: Diagnosis not present

## 2020-02-28 DIAGNOSIS — R062 Wheezing: Secondary | ICD-10-CM | POA: Diagnosis not present

## 2020-02-28 DIAGNOSIS — L299 Pruritus, unspecified: Secondary | ICD-10-CM | POA: Diagnosis not present

## 2020-02-28 DIAGNOSIS — D2261 Melanocytic nevi of right upper limb, including shoulder: Secondary | ICD-10-CM | POA: Diagnosis not present

## 2020-02-28 DIAGNOSIS — Z96611 Presence of right artificial shoulder joint: Secondary | ICD-10-CM | POA: Diagnosis not present

## 2020-02-28 DIAGNOSIS — E118 Type 2 diabetes mellitus with unspecified complications: Secondary | ICD-10-CM | POA: Diagnosis not present

## 2020-02-28 DIAGNOSIS — E440 Moderate protein-calorie malnutrition: Secondary | ICD-10-CM | POA: Diagnosis not present

## 2020-02-28 DIAGNOSIS — E2839 Other primary ovarian failure: Secondary | ICD-10-CM | POA: Diagnosis not present

## 2020-02-28 DIAGNOSIS — R55 Syncope and collapse: Secondary | ICD-10-CM | POA: Diagnosis not present

## 2020-02-28 DIAGNOSIS — K859 Acute pancreatitis without necrosis or infection, unspecified: Secondary | ICD-10-CM | POA: Diagnosis not present

## 2020-02-28 DIAGNOSIS — C561 Malignant neoplasm of right ovary: Secondary | ICD-10-CM | POA: Diagnosis not present

## 2020-02-28 DIAGNOSIS — I9589 Other hypotension: Secondary | ICD-10-CM | POA: Diagnosis not present

## 2020-02-28 DIAGNOSIS — F17210 Nicotine dependence, cigarettes, uncomplicated: Secondary | ICD-10-CM | POA: Diagnosis not present

## 2020-02-28 DIAGNOSIS — R000 Tachycardia, unspecified: Secondary | ICD-10-CM | POA: Diagnosis not present

## 2020-02-28 DIAGNOSIS — M419 Scoliosis, unspecified: Secondary | ICD-10-CM | POA: Diagnosis not present

## 2020-02-28 DIAGNOSIS — L84 Corns and callosities: Secondary | ICD-10-CM | POA: Diagnosis not present

## 2020-02-28 DIAGNOSIS — M545 Low back pain: Secondary | ICD-10-CM | POA: Diagnosis not present

## 2020-02-28 DIAGNOSIS — E1129 Type 2 diabetes mellitus with other diabetic kidney complication: Secondary | ICD-10-CM | POA: Diagnosis not present

## 2020-02-28 DIAGNOSIS — G4733 Obstructive sleep apnea (adult) (pediatric): Secondary | ICD-10-CM | POA: Diagnosis not present

## 2020-02-28 DIAGNOSIS — I340 Nonrheumatic mitral (valve) insufficiency: Secondary | ICD-10-CM | POA: Diagnosis not present

## 2020-02-28 DIAGNOSIS — H2703 Aphakia, bilateral: Secondary | ICD-10-CM | POA: Diagnosis not present

## 2020-02-28 DIAGNOSIS — I4821 Permanent atrial fibrillation: Secondary | ICD-10-CM | POA: Diagnosis not present

## 2020-02-28 DIAGNOSIS — Z780 Asymptomatic menopausal state: Secondary | ICD-10-CM | POA: Diagnosis not present

## 2020-02-28 DIAGNOSIS — G44219 Episodic tension-type headache, not intractable: Secondary | ICD-10-CM | POA: Diagnosis not present

## 2020-02-28 DIAGNOSIS — Z9841 Cataract extraction status, right eye: Secondary | ICD-10-CM | POA: Diagnosis not present

## 2020-02-28 DIAGNOSIS — R2681 Unsteadiness on feet: Secondary | ICD-10-CM | POA: Diagnosis not present

## 2020-02-28 DIAGNOSIS — J9620 Acute and chronic respiratory failure, unspecified whether with hypoxia or hypercapnia: Secondary | ICD-10-CM | POA: Diagnosis not present

## 2020-02-28 DIAGNOSIS — N400 Enlarged prostate without lower urinary tract symptoms: Secondary | ICD-10-CM | POA: Diagnosis not present

## 2020-02-28 DIAGNOSIS — S52501A Unspecified fracture of the lower end of right radius, initial encounter for closed fracture: Secondary | ICD-10-CM | POA: Diagnosis not present

## 2020-02-28 DIAGNOSIS — H52202 Unspecified astigmatism, left eye: Secondary | ICD-10-CM | POA: Diagnosis not present

## 2020-02-28 DIAGNOSIS — B820 Intestinal helminthiasis, unspecified: Secondary | ICD-10-CM | POA: Diagnosis not present

## 2020-02-28 DIAGNOSIS — M1030 Gout due to renal impairment, unspecified site: Secondary | ICD-10-CM | POA: Diagnosis not present

## 2020-02-28 DIAGNOSIS — F250 Schizoaffective disorder, bipolar type: Secondary | ICD-10-CM | POA: Diagnosis not present

## 2020-02-28 DIAGNOSIS — L97521 Non-pressure chronic ulcer of other part of left foot limited to breakdown of skin: Secondary | ICD-10-CM | POA: Diagnosis not present

## 2020-02-28 DIAGNOSIS — I428 Other cardiomyopathies: Secondary | ICD-10-CM | POA: Diagnosis not present

## 2020-02-28 DIAGNOSIS — J410 Simple chronic bronchitis: Secondary | ICD-10-CM | POA: Diagnosis not present

## 2020-02-28 DIAGNOSIS — H9202 Otalgia, left ear: Secondary | ICD-10-CM | POA: Diagnosis not present

## 2020-02-28 DIAGNOSIS — F331 Major depressive disorder, recurrent, moderate: Secondary | ICD-10-CM | POA: Diagnosis not present

## 2020-02-28 DIAGNOSIS — Z1389 Encounter for screening for other disorder: Secondary | ICD-10-CM | POA: Diagnosis not present

## 2020-02-28 DIAGNOSIS — I6523 Occlusion and stenosis of bilateral carotid arteries: Secondary | ICD-10-CM | POA: Diagnosis not present

## 2020-02-28 DIAGNOSIS — C642 Malignant neoplasm of left kidney, except renal pelvis: Secondary | ICD-10-CM | POA: Diagnosis not present

## 2020-02-28 DIAGNOSIS — I5032 Chronic diastolic (congestive) heart failure: Secondary | ICD-10-CM | POA: Diagnosis not present

## 2020-02-28 DIAGNOSIS — D120 Benign neoplasm of cecum: Secondary | ICD-10-CM | POA: Diagnosis not present

## 2020-02-28 DIAGNOSIS — S6991XA Unspecified injury of right wrist, hand and finger(s), initial encounter: Secondary | ICD-10-CM | POA: Diagnosis not present

## 2020-02-28 DIAGNOSIS — E663 Overweight: Secondary | ICD-10-CM | POA: Diagnosis not present

## 2020-02-28 DIAGNOSIS — J1282 Pneumonia due to coronavirus disease 2019: Secondary | ICD-10-CM | POA: Diagnosis not present

## 2020-02-28 DIAGNOSIS — M79675 Pain in left toe(s): Secondary | ICD-10-CM | POA: Diagnosis not present

## 2020-02-28 DIAGNOSIS — B9689 Other specified bacterial agents as the cause of diseases classified elsewhere: Secondary | ICD-10-CM | POA: Diagnosis not present

## 2020-02-28 DIAGNOSIS — J440 Chronic obstructive pulmonary disease with acute lower respiratory infection: Secondary | ICD-10-CM | POA: Diagnosis not present

## 2020-02-28 DIAGNOSIS — M25542 Pain in joints of left hand: Secondary | ICD-10-CM | POA: Diagnosis not present

## 2020-02-28 DIAGNOSIS — J4530 Mild persistent asthma, uncomplicated: Secondary | ICD-10-CM | POA: Diagnosis not present

## 2020-02-28 DIAGNOSIS — S59092A Other physeal fracture of lower end of ulna, left arm, initial encounter for closed fracture: Secondary | ICD-10-CM | POA: Diagnosis not present

## 2020-02-28 DIAGNOSIS — Z96653 Presence of artificial knee joint, bilateral: Secondary | ICD-10-CM | POA: Diagnosis not present

## 2020-02-28 DIAGNOSIS — Z9013 Acquired absence of bilateral breasts and nipples: Secondary | ICD-10-CM | POA: Diagnosis not present

## 2020-02-28 DIAGNOSIS — Z7952 Long term (current) use of systemic steroids: Secondary | ICD-10-CM | POA: Diagnosis not present

## 2020-02-28 DIAGNOSIS — C49A5 Gastrointestinal stromal tumor of rectum: Secondary | ICD-10-CM | POA: Diagnosis not present

## 2020-02-28 DIAGNOSIS — H25812 Combined forms of age-related cataract, left eye: Secondary | ICD-10-CM | POA: Diagnosis not present

## 2020-02-28 DIAGNOSIS — K7290 Hepatic failure, unspecified without coma: Secondary | ICD-10-CM | POA: Diagnosis not present

## 2020-02-28 DIAGNOSIS — F902 Attention-deficit hyperactivity disorder, combined type: Secondary | ICD-10-CM | POA: Diagnosis not present

## 2020-02-28 DIAGNOSIS — I519 Heart disease, unspecified: Secondary | ICD-10-CM | POA: Diagnosis not present

## 2020-02-28 DIAGNOSIS — S8002XA Contusion of left knee, initial encounter: Secondary | ICD-10-CM | POA: Diagnosis not present

## 2020-02-28 DIAGNOSIS — K4130 Unilateral femoral hernia, with obstruction, without gangrene, not specified as recurrent: Secondary | ICD-10-CM | POA: Diagnosis not present

## 2020-02-28 DIAGNOSIS — E875 Hyperkalemia: Secondary | ICD-10-CM | POA: Diagnosis not present

## 2020-02-28 DIAGNOSIS — F17290 Nicotine dependence, other tobacco product, uncomplicated: Secondary | ICD-10-CM | POA: Diagnosis not present

## 2020-02-28 DIAGNOSIS — M79672 Pain in left foot: Secondary | ICD-10-CM | POA: Diagnosis not present

## 2020-02-28 DIAGNOSIS — M25519 Pain in unspecified shoulder: Secondary | ICD-10-CM | POA: Diagnosis not present

## 2020-02-28 DIAGNOSIS — M25569 Pain in unspecified knee: Secondary | ICD-10-CM | POA: Diagnosis not present

## 2020-02-28 DIAGNOSIS — G2581 Restless legs syndrome: Secondary | ICD-10-CM | POA: Diagnosis not present

## 2020-02-28 DIAGNOSIS — Z9221 Personal history of antineoplastic chemotherapy: Secondary | ICD-10-CM | POA: Diagnosis not present

## 2020-02-28 DIAGNOSIS — I099 Rheumatic heart disease, unspecified: Secondary | ICD-10-CM | POA: Diagnosis not present

## 2020-02-28 DIAGNOSIS — H31103 Choroidal degeneration, unspecified, bilateral: Secondary | ICD-10-CM | POA: Diagnosis not present

## 2020-02-28 DIAGNOSIS — I951 Orthostatic hypotension: Secondary | ICD-10-CM | POA: Diagnosis not present

## 2020-02-28 DIAGNOSIS — L97322 Non-pressure chronic ulcer of left ankle with fat layer exposed: Secondary | ICD-10-CM | POA: Diagnosis not present

## 2020-02-28 DIAGNOSIS — Z719 Counseling, unspecified: Secondary | ICD-10-CM | POA: Diagnosis not present

## 2020-02-28 DIAGNOSIS — M5187 Other intervertebral disc disorders, lumbosacral region: Secondary | ICD-10-CM | POA: Diagnosis not present

## 2020-02-28 DIAGNOSIS — M14671 Charcot's joint, right ankle and foot: Secondary | ICD-10-CM | POA: Diagnosis not present

## 2020-02-28 DIAGNOSIS — M5416 Radiculopathy, lumbar region: Secondary | ICD-10-CM | POA: Diagnosis not present

## 2020-02-28 DIAGNOSIS — Z0000 Encounter for general adult medical examination without abnormal findings: Secondary | ICD-10-CM | POA: Diagnosis not present

## 2020-02-28 DIAGNOSIS — K644 Residual hemorrhoidal skin tags: Secondary | ICD-10-CM | POA: Diagnosis not present

## 2020-02-28 DIAGNOSIS — J849 Interstitial pulmonary disease, unspecified: Secondary | ICD-10-CM | POA: Diagnosis not present

## 2020-02-28 DIAGNOSIS — Z515 Encounter for palliative care: Secondary | ICD-10-CM | POA: Diagnosis not present

## 2020-02-28 DIAGNOSIS — L602 Onychogryphosis: Secondary | ICD-10-CM | POA: Diagnosis not present

## 2020-02-28 DIAGNOSIS — H43392 Other vitreous opacities, left eye: Secondary | ICD-10-CM | POA: Diagnosis not present

## 2020-02-28 DIAGNOSIS — R9439 Abnormal result of other cardiovascular function study: Secondary | ICD-10-CM | POA: Diagnosis not present

## 2020-02-28 DIAGNOSIS — E039 Hypothyroidism, unspecified: Secondary | ICD-10-CM | POA: Diagnosis not present

## 2020-02-28 DIAGNOSIS — J8410 Pulmonary fibrosis, unspecified: Secondary | ICD-10-CM | POA: Diagnosis not present

## 2020-02-28 DIAGNOSIS — R310 Gross hematuria: Secondary | ICD-10-CM | POA: Diagnosis not present

## 2020-02-28 DIAGNOSIS — M8448XA Pathological fracture, other site, initial encounter for fracture: Secondary | ICD-10-CM | POA: Diagnosis not present

## 2020-02-28 DIAGNOSIS — H353231 Exudative age-related macular degeneration, bilateral, with active choroidal neovascularization: Secondary | ICD-10-CM | POA: Diagnosis not present

## 2020-02-28 DIAGNOSIS — R6889 Other general symptoms and signs: Secondary | ICD-10-CM | POA: Diagnosis not present

## 2020-02-28 DIAGNOSIS — H18513 Endothelial corneal dystrophy, bilateral: Secondary | ICD-10-CM | POA: Diagnosis not present

## 2020-02-28 DIAGNOSIS — E113392 Type 2 diabetes mellitus with moderate nonproliferative diabetic retinopathy without macular edema, left eye: Secondary | ICD-10-CM | POA: Diagnosis not present

## 2020-02-28 DIAGNOSIS — M25622 Stiffness of left elbow, not elsewhere classified: Secondary | ICD-10-CM | POA: Diagnosis not present

## 2020-02-28 DIAGNOSIS — S065X9A Traumatic subdural hemorrhage with loss of consciousness of unspecified duration, initial encounter: Secondary | ICD-10-CM | POA: Diagnosis not present

## 2020-02-28 DIAGNOSIS — D0462 Carcinoma in situ of skin of left upper limb, including shoulder: Secondary | ICD-10-CM | POA: Diagnosis not present

## 2020-02-28 DIAGNOSIS — L02213 Cutaneous abscess of chest wall: Secondary | ICD-10-CM | POA: Diagnosis not present

## 2020-02-28 DIAGNOSIS — B1710 Acute hepatitis C without hepatic coma: Secondary | ICD-10-CM | POA: Diagnosis not present

## 2020-02-28 DIAGNOSIS — E662 Morbid (severe) obesity with alveolar hypoventilation: Secondary | ICD-10-CM | POA: Diagnosis not present

## 2020-02-28 DIAGNOSIS — H401111 Primary open-angle glaucoma, right eye, mild stage: Secondary | ICD-10-CM | POA: Diagnosis not present

## 2020-02-28 DIAGNOSIS — K5903 Drug induced constipation: Secondary | ICD-10-CM | POA: Diagnosis not present

## 2020-02-28 DIAGNOSIS — K649 Unspecified hemorrhoids: Secondary | ICD-10-CM | POA: Diagnosis not present

## 2020-02-28 DIAGNOSIS — E878 Other disorders of electrolyte and fluid balance, not elsewhere classified: Secondary | ICD-10-CM | POA: Diagnosis not present

## 2020-02-28 DIAGNOSIS — M955 Acquired deformity of pelvis: Secondary | ICD-10-CM | POA: Diagnosis not present

## 2020-02-28 DIAGNOSIS — M1991 Primary osteoarthritis, unspecified site: Secondary | ICD-10-CM | POA: Diagnosis not present

## 2020-02-28 DIAGNOSIS — K819 Cholecystitis, unspecified: Secondary | ICD-10-CM | POA: Diagnosis not present

## 2020-02-28 DIAGNOSIS — K5900 Constipation, unspecified: Secondary | ICD-10-CM | POA: Diagnosis not present

## 2020-02-28 DIAGNOSIS — I679 Cerebrovascular disease, unspecified: Secondary | ICD-10-CM | POA: Diagnosis not present

## 2020-02-28 DIAGNOSIS — N182 Chronic kidney disease, stage 2 (mild): Secondary | ICD-10-CM | POA: Diagnosis not present

## 2020-02-28 DIAGNOSIS — Z20828 Contact with and (suspected) exposure to other viral communicable diseases: Secondary | ICD-10-CM | POA: Diagnosis not present

## 2020-02-28 DIAGNOSIS — R070 Pain in throat: Secondary | ICD-10-CM | POA: Diagnosis not present

## 2020-02-28 DIAGNOSIS — Z9181 History of falling: Secondary | ICD-10-CM | POA: Diagnosis not present

## 2020-02-28 DIAGNOSIS — S82831A Other fracture of upper and lower end of right fibula, initial encounter for closed fracture: Secondary | ICD-10-CM | POA: Diagnosis not present

## 2020-02-28 DIAGNOSIS — M170 Bilateral primary osteoarthritis of knee: Secondary | ICD-10-CM | POA: Diagnosis not present

## 2020-02-28 DIAGNOSIS — S9031XA Contusion of right foot, initial encounter: Secondary | ICD-10-CM | POA: Diagnosis not present

## 2020-02-28 DIAGNOSIS — Z6831 Body mass index (BMI) 31.0-31.9, adult: Secondary | ICD-10-CM | POA: Diagnosis not present

## 2020-02-28 DIAGNOSIS — Z90710 Acquired absence of both cervix and uterus: Secondary | ICD-10-CM | POA: Diagnosis not present

## 2020-02-28 DIAGNOSIS — I119 Hypertensive heart disease without heart failure: Secondary | ICD-10-CM | POA: Diagnosis not present

## 2020-02-28 DIAGNOSIS — N3943 Post-void dribbling: Secondary | ICD-10-CM | POA: Diagnosis not present

## 2020-02-28 DIAGNOSIS — M48062 Spinal stenosis, lumbar region with neurogenic claudication: Secondary | ICD-10-CM | POA: Diagnosis not present

## 2020-02-28 DIAGNOSIS — L239 Allergic contact dermatitis, unspecified cause: Secondary | ICD-10-CM | POA: Diagnosis not present

## 2020-02-28 DIAGNOSIS — F319 Bipolar disorder, unspecified: Secondary | ICD-10-CM | POA: Diagnosis not present

## 2020-02-28 DIAGNOSIS — T451X5A Adverse effect of antineoplastic and immunosuppressive drugs, initial encounter: Secondary | ICD-10-CM | POA: Diagnosis not present

## 2020-02-28 DIAGNOSIS — H02834 Dermatochalasis of left upper eyelid: Secondary | ICD-10-CM | POA: Diagnosis not present

## 2020-02-28 DIAGNOSIS — D485 Neoplasm of uncertain behavior of skin: Secondary | ICD-10-CM | POA: Diagnosis not present

## 2020-02-28 DIAGNOSIS — N736 Female pelvic peritoneal adhesions (postinfective): Secondary | ICD-10-CM | POA: Diagnosis not present

## 2020-02-28 DIAGNOSIS — M5090 Cervical disc disorder, unspecified, unspecified cervical region: Secondary | ICD-10-CM | POA: Diagnosis not present

## 2020-02-28 DIAGNOSIS — R4189 Other symptoms and signs involving cognitive functions and awareness: Secondary | ICD-10-CM | POA: Diagnosis not present

## 2020-02-28 DIAGNOSIS — I079 Rheumatic tricuspid valve disease, unspecified: Secondary | ICD-10-CM | POA: Diagnosis not present

## 2020-02-28 DIAGNOSIS — C538 Malignant neoplasm of overlapping sites of cervix uteri: Secondary | ICD-10-CM | POA: Diagnosis not present

## 2020-02-28 DIAGNOSIS — L03032 Cellulitis of left toe: Secondary | ICD-10-CM | POA: Diagnosis not present

## 2020-02-28 DIAGNOSIS — I059 Rheumatic mitral valve disease, unspecified: Secondary | ICD-10-CM | POA: Diagnosis not present

## 2020-02-28 DIAGNOSIS — R591 Generalized enlarged lymph nodes: Secondary | ICD-10-CM | POA: Diagnosis not present

## 2020-02-28 DIAGNOSIS — L03011 Cellulitis of right finger: Secondary | ICD-10-CM | POA: Diagnosis not present

## 2020-02-28 DIAGNOSIS — R2242 Localized swelling, mass and lump, left lower limb: Secondary | ICD-10-CM | POA: Diagnosis not present

## 2020-02-28 DIAGNOSIS — N814 Uterovaginal prolapse, unspecified: Secondary | ICD-10-CM | POA: Diagnosis not present

## 2020-02-28 DIAGNOSIS — J441 Chronic obstructive pulmonary disease with (acute) exacerbation: Secondary | ICD-10-CM | POA: Diagnosis not present

## 2020-02-28 DIAGNOSIS — R29810 Facial weakness: Secondary | ICD-10-CM | POA: Diagnosis not present

## 2020-02-28 DIAGNOSIS — K581 Irritable bowel syndrome with constipation: Secondary | ICD-10-CM | POA: Diagnosis not present

## 2020-02-28 DIAGNOSIS — F1190 Opioid use, unspecified, uncomplicated: Secondary | ICD-10-CM | POA: Diagnosis not present

## 2020-02-28 DIAGNOSIS — H6060 Unspecified chronic otitis externa, unspecified ear: Secondary | ICD-10-CM | POA: Diagnosis not present

## 2020-02-28 DIAGNOSIS — R202 Paresthesia of skin: Secondary | ICD-10-CM | POA: Diagnosis not present

## 2020-02-28 DIAGNOSIS — I824Z3 Acute embolism and thrombosis of unspecified deep veins of distal lower extremity, bilateral: Secondary | ICD-10-CM | POA: Diagnosis not present

## 2020-02-28 DIAGNOSIS — I82411 Acute embolism and thrombosis of right femoral vein: Secondary | ICD-10-CM | POA: Diagnosis not present

## 2020-02-28 DIAGNOSIS — K50111 Crohn's disease of large intestine with rectal bleeding: Secondary | ICD-10-CM | POA: Diagnosis not present

## 2020-02-28 DIAGNOSIS — R350 Frequency of micturition: Secondary | ICD-10-CM | POA: Diagnosis not present

## 2020-02-28 DIAGNOSIS — G309 Alzheimer's disease, unspecified: Secondary | ICD-10-CM | POA: Diagnosis not present

## 2020-02-28 DIAGNOSIS — N951 Menopausal and female climacteric states: Secondary | ICD-10-CM | POA: Diagnosis not present

## 2020-02-28 DIAGNOSIS — C801 Malignant (primary) neoplasm, unspecified: Secondary | ICD-10-CM | POA: Diagnosis not present

## 2020-02-28 DIAGNOSIS — K259 Gastric ulcer, unspecified as acute or chronic, without hemorrhage or perforation: Secondary | ICD-10-CM | POA: Diagnosis not present

## 2020-02-28 DIAGNOSIS — R490 Dysphonia: Secondary | ICD-10-CM | POA: Diagnosis not present

## 2020-02-28 DIAGNOSIS — H00016 Hordeolum externum left eye, unspecified eyelid: Secondary | ICD-10-CM | POA: Diagnosis not present

## 2020-02-28 DIAGNOSIS — G8911 Acute pain due to trauma: Secondary | ICD-10-CM | POA: Diagnosis not present

## 2020-02-28 DIAGNOSIS — K9289 Other specified diseases of the digestive system: Secondary | ICD-10-CM | POA: Diagnosis not present

## 2020-02-28 DIAGNOSIS — R140 Abdominal distension (gaseous): Secondary | ICD-10-CM | POA: Diagnosis not present

## 2020-02-28 DIAGNOSIS — E063 Autoimmune thyroiditis: Secondary | ICD-10-CM | POA: Diagnosis not present

## 2020-02-28 DIAGNOSIS — Z0001 Encounter for general adult medical examination with abnormal findings: Secondary | ICD-10-CM | POA: Diagnosis not present

## 2020-02-28 DIAGNOSIS — I209 Angina pectoris, unspecified: Secondary | ICD-10-CM | POA: Diagnosis not present

## 2020-02-28 DIAGNOSIS — Z0189 Encounter for other specified special examinations: Secondary | ICD-10-CM | POA: Diagnosis not present

## 2020-02-28 DIAGNOSIS — S161XXA Strain of muscle, fascia and tendon at neck level, initial encounter: Secondary | ICD-10-CM | POA: Diagnosis not present

## 2020-02-28 DIAGNOSIS — I4891 Unspecified atrial fibrillation: Secondary | ICD-10-CM | POA: Diagnosis not present

## 2020-02-28 DIAGNOSIS — E0590 Thyrotoxicosis, unspecified without thyrotoxic crisis or storm: Secondary | ICD-10-CM | POA: Diagnosis not present

## 2020-02-28 DIAGNOSIS — R6883 Chills (without fever): Secondary | ICD-10-CM | POA: Diagnosis not present

## 2020-02-28 DIAGNOSIS — C50911 Malignant neoplasm of unspecified site of right female breast: Secondary | ICD-10-CM | POA: Diagnosis not present

## 2020-02-28 DIAGNOSIS — K5289 Other specified noninfective gastroenteritis and colitis: Secondary | ICD-10-CM | POA: Diagnosis not present

## 2020-02-28 DIAGNOSIS — Z5112 Encounter for antineoplastic immunotherapy: Secondary | ICD-10-CM | POA: Diagnosis not present

## 2020-02-28 DIAGNOSIS — M25511 Pain in right shoulder: Secondary | ICD-10-CM | POA: Diagnosis not present

## 2020-02-28 DIAGNOSIS — J9610 Chronic respiratory failure, unspecified whether with hypoxia or hypercapnia: Secondary | ICD-10-CM | POA: Diagnosis not present

## 2020-02-28 DIAGNOSIS — H02135 Senile ectropion of left lower eyelid: Secondary | ICD-10-CM | POA: Diagnosis not present

## 2020-02-28 DIAGNOSIS — M25762 Osteophyte, left knee: Secondary | ICD-10-CM | POA: Diagnosis not present

## 2020-02-28 DIAGNOSIS — H52203 Unspecified astigmatism, bilateral: Secondary | ICD-10-CM | POA: Diagnosis not present

## 2020-02-28 DIAGNOSIS — N5201 Erectile dysfunction due to arterial insufficiency: Secondary | ICD-10-CM | POA: Diagnosis not present

## 2020-02-28 DIAGNOSIS — S91109D Unspecified open wound of unspecified toe(s) without damage to nail, subsequent encounter: Secondary | ICD-10-CM | POA: Diagnosis not present

## 2020-02-28 DIAGNOSIS — Z9981 Dependence on supplemental oxygen: Secondary | ICD-10-CM | POA: Diagnosis not present

## 2020-02-28 DIAGNOSIS — Z5111 Encounter for antineoplastic chemotherapy: Secondary | ICD-10-CM | POA: Diagnosis not present

## 2020-02-28 DIAGNOSIS — Q799 Congenital malformation of musculoskeletal system, unspecified: Secondary | ICD-10-CM | POA: Diagnosis not present

## 2020-02-28 DIAGNOSIS — E1143 Type 2 diabetes mellitus with diabetic autonomic (poly)neuropathy: Secondary | ICD-10-CM | POA: Diagnosis not present

## 2020-02-28 DIAGNOSIS — Z8542 Personal history of malignant neoplasm of other parts of uterus: Secondary | ICD-10-CM | POA: Diagnosis not present

## 2020-02-28 DIAGNOSIS — R413 Other amnesia: Secondary | ICD-10-CM | POA: Diagnosis not present

## 2020-02-28 DIAGNOSIS — G9009 Other idiopathic peripheral autonomic neuropathy: Secondary | ICD-10-CM | POA: Diagnosis not present

## 2020-02-28 DIAGNOSIS — Z8774 Personal history of (corrected) congenital malformations of heart and circulatory system: Secondary | ICD-10-CM | POA: Diagnosis not present

## 2020-02-28 DIAGNOSIS — R64 Cachexia: Secondary | ICD-10-CM | POA: Diagnosis not present

## 2020-02-28 DIAGNOSIS — E782 Mixed hyperlipidemia: Secondary | ICD-10-CM | POA: Diagnosis not present

## 2020-02-28 DIAGNOSIS — N185 Chronic kidney disease, stage 5: Secondary | ICD-10-CM | POA: Diagnosis not present

## 2020-02-28 DIAGNOSIS — Z510 Encounter for antineoplastic radiation therapy: Secondary | ICD-10-CM | POA: Diagnosis not present

## 2020-02-28 DIAGNOSIS — C439 Malignant melanoma of skin, unspecified: Secondary | ICD-10-CM | POA: Diagnosis not present

## 2020-02-28 DIAGNOSIS — D123 Benign neoplasm of transverse colon: Secondary | ICD-10-CM | POA: Diagnosis not present

## 2020-02-28 DIAGNOSIS — T8141XA Infection following a procedure, superficial incisional surgical site, initial encounter: Secondary | ICD-10-CM | POA: Diagnosis not present

## 2020-02-28 DIAGNOSIS — S52502B Unspecified fracture of the lower end of left radius, initial encounter for open fracture type I or II: Secondary | ICD-10-CM | POA: Diagnosis not present

## 2020-02-28 DIAGNOSIS — M3320 Polymyositis, organ involvement unspecified: Secondary | ICD-10-CM | POA: Diagnosis not present

## 2020-02-28 DIAGNOSIS — M67912 Unspecified disorder of synovium and tendon, left shoulder: Secondary | ICD-10-CM | POA: Diagnosis not present

## 2020-02-28 DIAGNOSIS — H43822 Vitreomacular adhesion, left eye: Secondary | ICD-10-CM | POA: Diagnosis not present

## 2020-02-28 DIAGNOSIS — T466X5A Adverse effect of antihyperlipidemic and antiarteriosclerotic drugs, initial encounter: Secondary | ICD-10-CM | POA: Diagnosis not present

## 2020-02-28 DIAGNOSIS — F900 Attention-deficit hyperactivity disorder, predominantly inattentive type: Secondary | ICD-10-CM | POA: Diagnosis not present

## 2020-02-28 DIAGNOSIS — M797 Fibromyalgia: Secondary | ICD-10-CM | POA: Diagnosis not present

## 2020-02-28 DIAGNOSIS — F321 Major depressive disorder, single episode, moderate: Secondary | ICD-10-CM | POA: Diagnosis not present

## 2020-02-28 DIAGNOSIS — I351 Nonrheumatic aortic (valve) insufficiency: Secondary | ICD-10-CM | POA: Diagnosis not present

## 2020-02-28 DIAGNOSIS — G4709 Other insomnia: Secondary | ICD-10-CM | POA: Diagnosis not present

## 2020-02-28 DIAGNOSIS — D045 Carcinoma in situ of skin of trunk: Secondary | ICD-10-CM | POA: Diagnosis not present

## 2020-02-28 DIAGNOSIS — R0989 Other specified symptoms and signs involving the circulatory and respiratory systems: Secondary | ICD-10-CM | POA: Diagnosis not present

## 2020-02-28 DIAGNOSIS — R1030 Lower abdominal pain, unspecified: Secondary | ICD-10-CM | POA: Diagnosis not present

## 2020-02-28 DIAGNOSIS — M79604 Pain in right leg: Secondary | ICD-10-CM | POA: Diagnosis not present

## 2020-02-28 DIAGNOSIS — I132 Hypertensive heart and chronic kidney disease with heart failure and with stage 5 chronic kidney disease, or end stage renal disease: Secondary | ICD-10-CM | POA: Diagnosis not present

## 2020-02-28 DIAGNOSIS — M159 Polyosteoarthritis, unspecified: Secondary | ICD-10-CM | POA: Diagnosis not present

## 2020-02-28 DIAGNOSIS — H16223 Keratoconjunctivitis sicca, not specified as Sjogren's, bilateral: Secondary | ICD-10-CM | POA: Diagnosis not present

## 2020-02-28 DIAGNOSIS — Z8551 Personal history of malignant neoplasm of bladder: Secondary | ICD-10-CM | POA: Diagnosis not present

## 2020-02-28 DIAGNOSIS — N184 Chronic kidney disease, stage 4 (severe): Secondary | ICD-10-CM | POA: Diagnosis not present

## 2020-02-28 DIAGNOSIS — N170 Acute kidney failure with tubular necrosis: Secondary | ICD-10-CM | POA: Diagnosis not present

## 2020-02-28 DIAGNOSIS — M7071 Other bursitis of hip, right hip: Secondary | ICD-10-CM | POA: Diagnosis not present

## 2020-02-28 DIAGNOSIS — R3912 Poor urinary stream: Secondary | ICD-10-CM | POA: Diagnosis not present

## 2020-02-28 DIAGNOSIS — H04123 Dry eye syndrome of bilateral lacrimal glands: Secondary | ICD-10-CM | POA: Diagnosis not present

## 2020-02-28 DIAGNOSIS — R791 Abnormal coagulation profile: Secondary | ICD-10-CM | POA: Diagnosis not present

## 2020-02-28 DIAGNOSIS — S52572B Other intraarticular fracture of lower end of left radius, initial encounter for open fracture type I or II: Secondary | ICD-10-CM | POA: Diagnosis not present

## 2020-02-28 DIAGNOSIS — I63529 Cerebral infarction due to unspecified occlusion or stenosis of unspecified anterior cerebral artery: Secondary | ICD-10-CM | POA: Diagnosis not present

## 2020-02-28 DIAGNOSIS — N838 Other noninflammatory disorders of ovary, fallopian tube and broad ligament: Secondary | ICD-10-CM | POA: Diagnosis not present

## 2020-02-28 DIAGNOSIS — N813 Complete uterovaginal prolapse: Secondary | ICD-10-CM | POA: Diagnosis not present

## 2020-02-28 DIAGNOSIS — Z7982 Long term (current) use of aspirin: Secondary | ICD-10-CM | POA: Diagnosis not present

## 2020-02-28 DIAGNOSIS — M79644 Pain in right finger(s): Secondary | ICD-10-CM | POA: Diagnosis not present

## 2020-02-28 DIAGNOSIS — S52502A Unspecified fracture of the lower end of left radius, initial encounter for closed fracture: Secondary | ICD-10-CM | POA: Diagnosis not present

## 2020-02-28 DIAGNOSIS — F1110 Opioid abuse, uncomplicated: Secondary | ICD-10-CM | POA: Diagnosis not present

## 2020-02-28 DIAGNOSIS — M12811 Other specific arthropathies, not elsewhere classified, right shoulder: Secondary | ICD-10-CM | POA: Diagnosis not present

## 2020-02-28 DIAGNOSIS — I724 Aneurysm of artery of lower extremity: Secondary | ICD-10-CM | POA: Diagnosis not present

## 2020-02-28 DIAGNOSIS — R069 Unspecified abnormalities of breathing: Secondary | ICD-10-CM | POA: Diagnosis not present

## 2020-02-28 DIAGNOSIS — H25041 Posterior subcapsular polar age-related cataract, right eye: Secondary | ICD-10-CM | POA: Diagnosis not present

## 2020-02-28 DIAGNOSIS — B370 Candidal stomatitis: Secondary | ICD-10-CM | POA: Diagnosis not present

## 2020-02-28 DIAGNOSIS — R102 Pelvic and perineal pain: Secondary | ICD-10-CM | POA: Diagnosis not present

## 2020-02-28 DIAGNOSIS — R21 Rash and other nonspecific skin eruption: Secondary | ICD-10-CM | POA: Diagnosis not present

## 2020-02-28 DIAGNOSIS — N139 Obstructive and reflux uropathy, unspecified: Secondary | ICD-10-CM | POA: Diagnosis not present

## 2020-02-28 DIAGNOSIS — M84453A Pathological fracture, unspecified femur, initial encounter for fracture: Secondary | ICD-10-CM | POA: Diagnosis not present

## 2020-02-28 DIAGNOSIS — L608 Other nail disorders: Secondary | ICD-10-CM | POA: Diagnosis not present

## 2020-02-28 DIAGNOSIS — L603 Nail dystrophy: Secondary | ICD-10-CM | POA: Diagnosis not present

## 2020-02-28 DIAGNOSIS — K641 Second degree hemorrhoids: Secondary | ICD-10-CM | POA: Diagnosis not present

## 2020-02-28 DIAGNOSIS — K089 Disorder of teeth and supporting structures, unspecified: Secondary | ICD-10-CM | POA: Diagnosis not present

## 2020-02-28 DIAGNOSIS — F3178 Bipolar disorder, in full remission, most recent episode mixed: Secondary | ICD-10-CM | POA: Diagnosis not present

## 2020-02-28 DIAGNOSIS — M47817 Spondylosis without myelopathy or radiculopathy, lumbosacral region: Secondary | ICD-10-CM | POA: Diagnosis not present

## 2020-02-28 DIAGNOSIS — H903 Sensorineural hearing loss, bilateral: Secondary | ICD-10-CM | POA: Diagnosis not present

## 2020-02-28 DIAGNOSIS — M5136 Other intervertebral disc degeneration, lumbar region: Secondary | ICD-10-CM | POA: Diagnosis not present

## 2020-02-28 DIAGNOSIS — G453 Amaurosis fugax: Secondary | ICD-10-CM | POA: Diagnosis not present

## 2020-02-28 DIAGNOSIS — Z8669 Personal history of other diseases of the nervous system and sense organs: Secondary | ICD-10-CM | POA: Diagnosis not present

## 2020-02-28 DIAGNOSIS — E872 Acidosis: Secondary | ICD-10-CM | POA: Diagnosis not present

## 2020-02-28 DIAGNOSIS — N186 End stage renal disease: Secondary | ICD-10-CM | POA: Diagnosis not present

## 2020-02-28 DIAGNOSIS — L97211 Non-pressure chronic ulcer of right calf limited to breakdown of skin: Secondary | ICD-10-CM | POA: Diagnosis not present

## 2020-02-28 DIAGNOSIS — H53002 Unspecified amblyopia, left eye: Secondary | ICD-10-CM | POA: Diagnosis not present

## 2020-02-28 DIAGNOSIS — K8010 Calculus of gallbladder with chronic cholecystitis without obstruction: Secondary | ICD-10-CM | POA: Diagnosis not present

## 2020-02-28 DIAGNOSIS — F330 Major depressive disorder, recurrent, mild: Secondary | ICD-10-CM | POA: Diagnosis not present

## 2020-02-28 DIAGNOSIS — H5213 Myopia, bilateral: Secondary | ICD-10-CM | POA: Diagnosis not present

## 2020-02-28 DIAGNOSIS — D329 Benign neoplasm of meninges, unspecified: Secondary | ICD-10-CM | POA: Diagnosis not present

## 2020-02-28 DIAGNOSIS — M5386 Other specified dorsopathies, lumbar region: Secondary | ICD-10-CM | POA: Diagnosis not present

## 2020-02-28 DIAGNOSIS — M6289 Other specified disorders of muscle: Secondary | ICD-10-CM | POA: Diagnosis not present

## 2020-02-28 DIAGNOSIS — F068 Other specified mental disorders due to known physiological condition: Secondary | ICD-10-CM | POA: Diagnosis not present

## 2020-02-28 DIAGNOSIS — Z886 Allergy status to analgesic agent status: Secondary | ICD-10-CM | POA: Diagnosis not present

## 2020-02-28 DIAGNOSIS — M9902 Segmental and somatic dysfunction of thoracic region: Secondary | ICD-10-CM | POA: Diagnosis not present

## 2020-02-28 DIAGNOSIS — J9809 Other diseases of bronchus, not elsewhere classified: Secondary | ICD-10-CM | POA: Diagnosis not present

## 2020-02-28 DIAGNOSIS — K582 Mixed irritable bowel syndrome: Secondary | ICD-10-CM | POA: Diagnosis not present

## 2020-02-28 DIAGNOSIS — F332 Major depressive disorder, recurrent severe without psychotic features: Secondary | ICD-10-CM | POA: Diagnosis not present

## 2020-02-28 DIAGNOSIS — J40 Bronchitis, not specified as acute or chronic: Secondary | ICD-10-CM | POA: Diagnosis not present

## 2020-02-28 DIAGNOSIS — Z6826 Body mass index (BMI) 26.0-26.9, adult: Secondary | ICD-10-CM | POA: Diagnosis not present

## 2020-02-28 DIAGNOSIS — N3000 Acute cystitis without hematuria: Secondary | ICD-10-CM | POA: Diagnosis not present

## 2020-02-28 DIAGNOSIS — I252 Old myocardial infarction: Secondary | ICD-10-CM | POA: Diagnosis not present

## 2020-02-28 DIAGNOSIS — K862 Cyst of pancreas: Secondary | ICD-10-CM | POA: Diagnosis not present

## 2020-02-28 DIAGNOSIS — M21372 Foot drop, left foot: Secondary | ICD-10-CM | POA: Diagnosis not present

## 2020-02-28 DIAGNOSIS — R2232 Localized swelling, mass and lump, left upper limb: Secondary | ICD-10-CM | POA: Diagnosis not present

## 2020-02-28 DIAGNOSIS — I87311 Chronic venous hypertension (idiopathic) with ulcer of right lower extremity: Secondary | ICD-10-CM | POA: Diagnosis not present

## 2020-02-28 DIAGNOSIS — M79671 Pain in right foot: Secondary | ICD-10-CM | POA: Diagnosis not present

## 2020-02-28 DIAGNOSIS — L817 Pigmented purpuric dermatosis: Secondary | ICD-10-CM | POA: Diagnosis not present

## 2020-02-28 DIAGNOSIS — J439 Emphysema, unspecified: Secondary | ICD-10-CM | POA: Diagnosis not present

## 2020-02-28 DIAGNOSIS — T148XXA Other injury of unspecified body region, initial encounter: Secondary | ICD-10-CM | POA: Diagnosis not present

## 2020-02-28 DIAGNOSIS — Z96642 Presence of left artificial hip joint: Secondary | ICD-10-CM | POA: Diagnosis not present

## 2020-02-28 DIAGNOSIS — Z85828 Personal history of other malignant neoplasm of skin: Secondary | ICD-10-CM | POA: Diagnosis not present

## 2020-02-28 DIAGNOSIS — R3911 Hesitancy of micturition: Secondary | ICD-10-CM | POA: Diagnosis not present

## 2020-02-28 DIAGNOSIS — I5030 Unspecified diastolic (congestive) heart failure: Secondary | ICD-10-CM | POA: Diagnosis not present

## 2020-02-28 DIAGNOSIS — K429 Umbilical hernia without obstruction or gangrene: Secondary | ICD-10-CM | POA: Diagnosis not present

## 2020-02-28 DIAGNOSIS — M79606 Pain in leg, unspecified: Secondary | ICD-10-CM | POA: Diagnosis not present

## 2020-02-28 DIAGNOSIS — M4312 Spondylolisthesis, cervical region: Secondary | ICD-10-CM | POA: Diagnosis not present

## 2020-02-28 DIAGNOSIS — M9903 Segmental and somatic dysfunction of lumbar region: Secondary | ICD-10-CM | POA: Diagnosis not present

## 2020-02-28 DIAGNOSIS — N189 Chronic kidney disease, unspecified: Secondary | ICD-10-CM | POA: Diagnosis not present

## 2020-02-28 DIAGNOSIS — N958 Other specified menopausal and perimenopausal disorders: Secondary | ICD-10-CM | POA: Diagnosis not present

## 2020-02-28 DIAGNOSIS — F418 Other specified anxiety disorders: Secondary | ICD-10-CM | POA: Diagnosis not present

## 2020-02-28 DIAGNOSIS — S90121A Contusion of right lesser toe(s) without damage to nail, initial encounter: Secondary | ICD-10-CM | POA: Diagnosis not present

## 2020-02-28 DIAGNOSIS — I25810 Atherosclerosis of coronary artery bypass graft(s) without angina pectoris: Secondary | ICD-10-CM | POA: Diagnosis not present

## 2020-02-28 DIAGNOSIS — H52213 Irregular astigmatism, bilateral: Secondary | ICD-10-CM | POA: Diagnosis not present

## 2020-02-28 DIAGNOSIS — N312 Flaccid neuropathic bladder, not elsewhere classified: Secondary | ICD-10-CM | POA: Diagnosis not present

## 2020-02-28 DIAGNOSIS — K5669 Other partial intestinal obstruction: Secondary | ICD-10-CM | POA: Diagnosis not present

## 2020-02-28 DIAGNOSIS — I739 Peripheral vascular disease, unspecified: Secondary | ICD-10-CM | POA: Diagnosis not present

## 2020-02-28 DIAGNOSIS — L97811 Non-pressure chronic ulcer of other part of right lower leg limited to breakdown of skin: Secondary | ICD-10-CM | POA: Diagnosis not present

## 2020-02-28 DIAGNOSIS — D489 Neoplasm of uncertain behavior, unspecified: Secondary | ICD-10-CM | POA: Diagnosis not present

## 2020-02-28 DIAGNOSIS — C641 Malignant neoplasm of right kidney, except renal pelvis: Secondary | ICD-10-CM | POA: Diagnosis not present

## 2020-02-28 DIAGNOSIS — K760 Fatty (change of) liver, not elsewhere classified: Secondary | ICD-10-CM | POA: Diagnosis not present

## 2020-02-28 DIAGNOSIS — E538 Deficiency of other specified B group vitamins: Secondary | ICD-10-CM | POA: Diagnosis not present

## 2020-02-28 DIAGNOSIS — N8110 Cystocele, unspecified: Secondary | ICD-10-CM | POA: Diagnosis not present

## 2020-02-28 DIAGNOSIS — Z8639 Personal history of other endocrine, nutritional and metabolic disease: Secondary | ICD-10-CM | POA: Diagnosis not present

## 2020-02-28 DIAGNOSIS — M67911 Unspecified disorder of synovium and tendon, right shoulder: Secondary | ICD-10-CM | POA: Diagnosis not present

## 2020-02-28 DIAGNOSIS — R110 Nausea: Secondary | ICD-10-CM | POA: Diagnosis not present

## 2020-02-28 DIAGNOSIS — R399 Unspecified symptoms and signs involving the genitourinary system: Secondary | ICD-10-CM | POA: Diagnosis not present

## 2020-02-28 DIAGNOSIS — N898 Other specified noninflammatory disorders of vagina: Secondary | ICD-10-CM | POA: Diagnosis not present

## 2020-02-28 DIAGNOSIS — S2231XD Fracture of one rib, right side, subsequent encounter for fracture with routine healing: Secondary | ICD-10-CM | POA: Diagnosis not present

## 2020-02-28 DIAGNOSIS — M62551 Muscle wasting and atrophy, not elsewhere classified, right thigh: Secondary | ICD-10-CM | POA: Diagnosis not present

## 2020-02-28 DIAGNOSIS — K5730 Diverticulosis of large intestine without perforation or abscess without bleeding: Secondary | ICD-10-CM | POA: Diagnosis not present

## 2020-02-28 DIAGNOSIS — Z1283 Encounter for screening for malignant neoplasm of skin: Secondary | ICD-10-CM | POA: Diagnosis not present

## 2020-02-28 DIAGNOSIS — R309 Painful micturition, unspecified: Secondary | ICD-10-CM | POA: Diagnosis not present

## 2020-02-28 DIAGNOSIS — L820 Inflamed seborrheic keratosis: Secondary | ICD-10-CM | POA: Diagnosis not present

## 2020-02-28 DIAGNOSIS — M1A09X0 Idiopathic chronic gout, multiple sites, without tophus (tophi): Secondary | ICD-10-CM | POA: Diagnosis not present

## 2020-02-28 DIAGNOSIS — R918 Other nonspecific abnormal finding of lung field: Secondary | ICD-10-CM | POA: Diagnosis not present

## 2020-02-28 DIAGNOSIS — C649 Malignant neoplasm of unspecified kidney, except renal pelvis: Secondary | ICD-10-CM | POA: Diagnosis not present

## 2020-02-28 DIAGNOSIS — D510 Vitamin B12 deficiency anemia due to intrinsic factor deficiency: Secondary | ICD-10-CM | POA: Diagnosis not present

## 2020-02-28 DIAGNOSIS — L97929 Non-pressure chronic ulcer of unspecified part of left lower leg with unspecified severity: Secondary | ICD-10-CM | POA: Diagnosis not present

## 2020-02-28 DIAGNOSIS — G8918 Other acute postprocedural pain: Secondary | ICD-10-CM | POA: Diagnosis not present

## 2020-02-28 DIAGNOSIS — E1142 Type 2 diabetes mellitus with diabetic polyneuropathy: Secondary | ICD-10-CM | POA: Diagnosis not present

## 2020-02-28 DIAGNOSIS — R9389 Abnormal findings on diagnostic imaging of other specified body structures: Secondary | ICD-10-CM | POA: Diagnosis not present

## 2020-02-28 DIAGNOSIS — S32010D Wedge compression fracture of first lumbar vertebra, subsequent encounter for fracture with routine healing: Secondary | ICD-10-CM | POA: Diagnosis not present

## 2020-02-28 DIAGNOSIS — J910 Malignant pleural effusion: Secondary | ICD-10-CM | POA: Diagnosis not present

## 2020-02-28 DIAGNOSIS — Z8711 Personal history of peptic ulcer disease: Secondary | ICD-10-CM | POA: Diagnosis not present

## 2020-02-28 DIAGNOSIS — M25411 Effusion, right shoulder: Secondary | ICD-10-CM | POA: Diagnosis not present

## 2020-02-28 DIAGNOSIS — I10 Essential (primary) hypertension: Secondary | ICD-10-CM | POA: Diagnosis not present

## 2020-02-28 DIAGNOSIS — R32 Unspecified urinary incontinence: Secondary | ICD-10-CM | POA: Diagnosis not present

## 2020-02-28 DIAGNOSIS — R922 Inconclusive mammogram: Secondary | ICD-10-CM | POA: Diagnosis not present

## 2020-02-28 DIAGNOSIS — N281 Cyst of kidney, acquired: Secondary | ICD-10-CM | POA: Diagnosis not present

## 2020-02-28 DIAGNOSIS — J189 Pneumonia, unspecified organism: Secondary | ICD-10-CM | POA: Diagnosis not present

## 2020-02-28 DIAGNOSIS — G459 Transient cerebral ischemic attack, unspecified: Secondary | ICD-10-CM | POA: Diagnosis not present

## 2020-02-28 DIAGNOSIS — L209 Atopic dermatitis, unspecified: Secondary | ICD-10-CM | POA: Diagnosis not present

## 2020-02-28 DIAGNOSIS — M19012 Primary osteoarthritis, left shoulder: Secondary | ICD-10-CM | POA: Diagnosis not present

## 2020-02-28 DIAGNOSIS — H18452 Nodular corneal degeneration, left eye: Secondary | ICD-10-CM | POA: Diagnosis not present

## 2020-02-28 DIAGNOSIS — F419 Anxiety disorder, unspecified: Secondary | ICD-10-CM | POA: Diagnosis not present

## 2020-02-28 DIAGNOSIS — S52602B Unspecified fracture of lower end of left ulna, initial encounter for open fracture type I or II: Secondary | ICD-10-CM | POA: Diagnosis not present

## 2020-02-28 DIAGNOSIS — F0280 Dementia in other diseases classified elsewhere without behavioral disturbance: Secondary | ICD-10-CM | POA: Diagnosis not present

## 2020-02-28 DIAGNOSIS — M7918 Myalgia, other site: Secondary | ICD-10-CM | POA: Diagnosis not present

## 2020-02-28 DIAGNOSIS — H18599 Other hereditary corneal dystrophies, unspecified eye: Secondary | ICD-10-CM | POA: Diagnosis not present

## 2020-02-28 DIAGNOSIS — I471 Supraventricular tachycardia: Secondary | ICD-10-CM | POA: Diagnosis not present

## 2020-02-28 DIAGNOSIS — M5417 Radiculopathy, lumbosacral region: Secondary | ICD-10-CM | POA: Diagnosis not present

## 2020-02-28 DIAGNOSIS — G4761 Periodic limb movement disorder: Secondary | ICD-10-CM | POA: Diagnosis not present

## 2020-02-28 DIAGNOSIS — R338 Other retention of urine: Secondary | ICD-10-CM | POA: Diagnosis not present

## 2020-02-28 DIAGNOSIS — I472 Ventricular tachycardia: Secondary | ICD-10-CM | POA: Diagnosis not present

## 2020-02-28 DIAGNOSIS — R600 Localized edema: Secondary | ICD-10-CM | POA: Diagnosis not present

## 2020-02-28 DIAGNOSIS — R239 Unspecified skin changes: Secondary | ICD-10-CM | POA: Diagnosis not present

## 2020-02-28 DIAGNOSIS — M4310 Spondylolisthesis, site unspecified: Secondary | ICD-10-CM | POA: Diagnosis not present

## 2020-02-28 DIAGNOSIS — E1165 Type 2 diabetes mellitus with hyperglycemia: Secondary | ICD-10-CM | POA: Diagnosis not present

## 2020-02-28 DIAGNOSIS — W010XXA Fall on same level from slipping, tripping and stumbling without subsequent striking against object, initial encounter: Secondary | ICD-10-CM | POA: Diagnosis not present

## 2020-02-28 DIAGNOSIS — H52209 Unspecified astigmatism, unspecified eye: Secondary | ICD-10-CM | POA: Diagnosis not present

## 2020-02-28 DIAGNOSIS — Z794 Long term (current) use of insulin: Secondary | ICD-10-CM | POA: Diagnosis not present

## 2020-02-28 DIAGNOSIS — F339 Major depressive disorder, recurrent, unspecified: Secondary | ICD-10-CM | POA: Diagnosis not present

## 2020-02-28 DIAGNOSIS — L409 Psoriasis, unspecified: Secondary | ICD-10-CM | POA: Diagnosis not present

## 2020-02-28 DIAGNOSIS — M25552 Pain in left hip: Secondary | ICD-10-CM | POA: Diagnosis not present

## 2020-02-28 DIAGNOSIS — S59902A Unspecified injury of left elbow, initial encounter: Secondary | ICD-10-CM | POA: Diagnosis not present

## 2020-02-28 DIAGNOSIS — Z139 Encounter for screening, unspecified: Secondary | ICD-10-CM | POA: Diagnosis not present

## 2020-02-28 DIAGNOSIS — T68XXXA Hypothermia, initial encounter: Secondary | ICD-10-CM | POA: Diagnosis not present

## 2020-02-28 DIAGNOSIS — R279 Unspecified lack of coordination: Secondary | ICD-10-CM | POA: Diagnosis not present

## 2020-02-28 DIAGNOSIS — Z658 Other specified problems related to psychosocial circumstances: Secondary | ICD-10-CM | POA: Diagnosis not present

## 2020-02-28 DIAGNOSIS — D849 Immunodeficiency, unspecified: Secondary | ICD-10-CM | POA: Diagnosis not present

## 2020-02-28 DIAGNOSIS — E1139 Type 2 diabetes mellitus with other diabetic ophthalmic complication: Secondary | ICD-10-CM | POA: Diagnosis not present

## 2020-02-28 DIAGNOSIS — T189XXA Foreign body of alimentary tract, part unspecified, initial encounter: Secondary | ICD-10-CM | POA: Diagnosis not present

## 2020-02-28 DIAGNOSIS — D239 Other benign neoplasm of skin, unspecified: Secondary | ICD-10-CM | POA: Diagnosis not present

## 2020-02-28 DIAGNOSIS — H6121 Impacted cerumen, right ear: Secondary | ICD-10-CM | POA: Diagnosis not present

## 2020-02-28 DIAGNOSIS — C541 Malignant neoplasm of endometrium: Secondary | ICD-10-CM | POA: Diagnosis not present

## 2020-02-28 DIAGNOSIS — J9811 Atelectasis: Secondary | ICD-10-CM | POA: Diagnosis not present

## 2020-02-28 DIAGNOSIS — R251 Tremor, unspecified: Secondary | ICD-10-CM | POA: Diagnosis not present

## 2020-02-28 DIAGNOSIS — J432 Centrilobular emphysema: Secondary | ICD-10-CM | POA: Diagnosis not present

## 2020-02-28 DIAGNOSIS — G9389 Other specified disorders of brain: Secondary | ICD-10-CM | POA: Diagnosis not present

## 2020-02-28 DIAGNOSIS — N390 Urinary tract infection, site not specified: Secondary | ICD-10-CM | POA: Diagnosis not present

## 2020-02-28 DIAGNOSIS — K4090 Unilateral inguinal hernia, without obstruction or gangrene, not specified as recurrent: Secondary | ICD-10-CM | POA: Diagnosis not present

## 2020-02-28 DIAGNOSIS — Z1339 Encounter for screening examination for other mental health and behavioral disorders: Secondary | ICD-10-CM | POA: Diagnosis not present

## 2020-02-28 DIAGNOSIS — G308 Other Alzheimer's disease: Secondary | ICD-10-CM | POA: Diagnosis not present

## 2020-02-28 DIAGNOSIS — C4491 Basal cell carcinoma of skin, unspecified: Secondary | ICD-10-CM | POA: Diagnosis not present

## 2020-02-28 DIAGNOSIS — J4540 Moderate persistent asthma, uncomplicated: Secondary | ICD-10-CM | POA: Diagnosis not present

## 2020-02-28 DIAGNOSIS — R293 Abnormal posture: Secondary | ICD-10-CM | POA: Diagnosis not present

## 2020-02-28 DIAGNOSIS — F0391 Unspecified dementia with behavioral disturbance: Secondary | ICD-10-CM | POA: Diagnosis not present

## 2020-02-28 DIAGNOSIS — E6609 Other obesity due to excess calories: Secondary | ICD-10-CM | POA: Diagnosis not present

## 2020-02-28 DIAGNOSIS — R3129 Other microscopic hematuria: Secondary | ICD-10-CM | POA: Diagnosis not present

## 2020-02-28 DIAGNOSIS — M791 Myalgia, unspecified site: Secondary | ICD-10-CM | POA: Diagnosis not present

## 2020-02-28 DIAGNOSIS — K635 Polyp of colon: Secondary | ICD-10-CM | POA: Diagnosis not present

## 2020-02-28 DIAGNOSIS — E861 Hypovolemia: Secondary | ICD-10-CM | POA: Diagnosis not present

## 2020-02-28 DIAGNOSIS — S32059A Unspecified fracture of fifth lumbar vertebra, initial encounter for closed fracture: Secondary | ICD-10-CM | POA: Diagnosis not present

## 2020-02-28 DIAGNOSIS — H43813 Vitreous degeneration, bilateral: Secondary | ICD-10-CM | POA: Diagnosis not present

## 2020-02-28 DIAGNOSIS — M169 Osteoarthritis of hip, unspecified: Secondary | ICD-10-CM | POA: Diagnosis not present

## 2020-02-28 DIAGNOSIS — R030 Elevated blood-pressure reading, without diagnosis of hypertension: Secondary | ICD-10-CM | POA: Diagnosis not present

## 2020-02-28 DIAGNOSIS — H8110 Benign paroxysmal vertigo, unspecified ear: Secondary | ICD-10-CM | POA: Diagnosis not present

## 2020-02-28 DIAGNOSIS — M5430 Sciatica, unspecified side: Secondary | ICD-10-CM | POA: Diagnosis not present

## 2020-02-28 DIAGNOSIS — Z79890 Hormone replacement therapy (postmenopausal): Secondary | ICD-10-CM | POA: Diagnosis not present

## 2020-02-28 DIAGNOSIS — Z7722 Contact with and (suspected) exposure to environmental tobacco smoke (acute) (chronic): Secondary | ICD-10-CM | POA: Diagnosis not present

## 2020-02-28 DIAGNOSIS — Z859 Personal history of malignant neoplasm, unspecified: Secondary | ICD-10-CM | POA: Diagnosis not present

## 2020-02-28 DIAGNOSIS — L97512 Non-pressure chronic ulcer of other part of right foot with fat layer exposed: Secondary | ICD-10-CM | POA: Diagnosis not present

## 2020-02-28 DIAGNOSIS — H168 Other keratitis: Secondary | ICD-10-CM | POA: Diagnosis not present

## 2020-02-28 DIAGNOSIS — K625 Hemorrhage of anus and rectum: Secondary | ICD-10-CM | POA: Diagnosis not present

## 2020-02-28 DIAGNOSIS — R002 Palpitations: Secondary | ICD-10-CM | POA: Diagnosis not present

## 2020-02-28 DIAGNOSIS — K5130 Ulcerative (chronic) rectosigmoiditis without complications: Secondary | ICD-10-CM | POA: Diagnosis not present

## 2020-02-28 DIAGNOSIS — R130 Aphagia: Secondary | ICD-10-CM | POA: Diagnosis not present

## 2020-02-28 DIAGNOSIS — M79605 Pain in left leg: Secondary | ICD-10-CM | POA: Diagnosis not present

## 2020-02-28 DIAGNOSIS — M818 Other osteoporosis without current pathological fracture: Secondary | ICD-10-CM | POA: Diagnosis not present

## 2020-02-28 DIAGNOSIS — S61200A Unspecified open wound of right index finger without damage to nail, initial encounter: Secondary | ICD-10-CM | POA: Diagnosis not present

## 2020-02-28 DIAGNOSIS — M4327 Fusion of spine, lumbosacral region: Secondary | ICD-10-CM | POA: Diagnosis not present

## 2020-02-28 DIAGNOSIS — H353132 Nonexudative age-related macular degeneration, bilateral, intermediate dry stage: Secondary | ICD-10-CM | POA: Diagnosis not present

## 2020-02-28 DIAGNOSIS — M5116 Intervertebral disc disorders with radiculopathy, lumbar region: Secondary | ICD-10-CM | POA: Diagnosis not present

## 2020-02-28 DIAGNOSIS — I77810 Thoracic aortic ectasia: Secondary | ICD-10-CM | POA: Diagnosis not present

## 2020-02-28 DIAGNOSIS — Z6829 Body mass index (BMI) 29.0-29.9, adult: Secondary | ICD-10-CM | POA: Diagnosis not present

## 2020-02-28 DIAGNOSIS — R220 Localized swelling, mass and lump, head: Secondary | ICD-10-CM | POA: Diagnosis not present

## 2020-02-28 DIAGNOSIS — D0511 Intraductal carcinoma in situ of right breast: Secondary | ICD-10-CM | POA: Diagnosis not present

## 2020-02-28 DIAGNOSIS — B078 Other viral warts: Secondary | ICD-10-CM | POA: Diagnosis not present

## 2020-02-28 DIAGNOSIS — M5412 Radiculopathy, cervical region: Secondary | ICD-10-CM | POA: Diagnosis not present

## 2020-02-28 DIAGNOSIS — H9190 Unspecified hearing loss, unspecified ear: Secondary | ICD-10-CM | POA: Diagnosis not present

## 2020-02-28 DIAGNOSIS — H93299 Other abnormal auditory perceptions, unspecified ear: Secondary | ICD-10-CM | POA: Diagnosis not present

## 2020-02-28 DIAGNOSIS — S52692A Other fracture of lower end of left ulna, initial encounter for closed fracture: Secondary | ICD-10-CM | POA: Diagnosis not present

## 2020-02-28 DIAGNOSIS — M359 Systemic involvement of connective tissue, unspecified: Secondary | ICD-10-CM | POA: Diagnosis not present

## 2020-02-28 DIAGNOSIS — S336XXA Sprain of sacroiliac joint, initial encounter: Secondary | ICD-10-CM | POA: Diagnosis not present

## 2020-02-28 DIAGNOSIS — T50905A Adverse effect of unspecified drugs, medicaments and biological substances, initial encounter: Secondary | ICD-10-CM | POA: Diagnosis not present

## 2020-02-28 DIAGNOSIS — M7502 Adhesive capsulitis of left shoulder: Secondary | ICD-10-CM | POA: Diagnosis not present

## 2020-02-28 DIAGNOSIS — M5134 Other intervertebral disc degeneration, thoracic region: Secondary | ICD-10-CM | POA: Diagnosis not present

## 2020-02-28 DIAGNOSIS — S41101D Unspecified open wound of right upper arm, subsequent encounter: Secondary | ICD-10-CM | POA: Diagnosis not present

## 2020-02-28 DIAGNOSIS — Y998 Other external cause status: Secondary | ICD-10-CM | POA: Diagnosis not present

## 2020-02-28 DIAGNOSIS — S59292A Other physeal fracture of lower end of radius, left arm, initial encounter for closed fracture: Secondary | ICD-10-CM | POA: Diagnosis not present

## 2020-02-28 DIAGNOSIS — R638 Other symptoms and signs concerning food and fluid intake: Secondary | ICD-10-CM | POA: Diagnosis not present

## 2020-02-28 DIAGNOSIS — S83232D Complex tear of medial meniscus, current injury, left knee, subsequent encounter: Secondary | ICD-10-CM | POA: Diagnosis not present

## 2020-02-28 DIAGNOSIS — I200 Unstable angina: Secondary | ICD-10-CM | POA: Diagnosis not present

## 2020-02-28 DIAGNOSIS — F1120 Opioid dependence, uncomplicated: Secondary | ICD-10-CM | POA: Diagnosis not present

## 2020-02-28 DIAGNOSIS — K5790 Diverticulosis of intestine, part unspecified, without perforation or abscess without bleeding: Secondary | ICD-10-CM | POA: Diagnosis not present

## 2020-02-28 DIAGNOSIS — C50411 Malignant neoplasm of upper-outer quadrant of right female breast: Secondary | ICD-10-CM | POA: Diagnosis not present

## 2020-02-28 DIAGNOSIS — Z736 Limitation of activities due to disability: Secondary | ICD-10-CM | POA: Diagnosis not present

## 2020-02-28 DIAGNOSIS — L97812 Non-pressure chronic ulcer of other part of right lower leg with fat layer exposed: Secondary | ICD-10-CM | POA: Diagnosis not present

## 2020-02-28 DIAGNOSIS — M5032 Other cervical disc degeneration, mid-cervical region, unspecified level: Secondary | ICD-10-CM | POA: Diagnosis not present

## 2020-02-28 DIAGNOSIS — M1712 Unilateral primary osteoarthritis, left knee: Secondary | ICD-10-CM | POA: Diagnosis not present

## 2020-02-28 DIAGNOSIS — R031 Nonspecific low blood-pressure reading: Secondary | ICD-10-CM | POA: Diagnosis not present

## 2020-02-28 DIAGNOSIS — M1612 Unilateral primary osteoarthritis, left hip: Secondary | ICD-10-CM | POA: Diagnosis not present

## 2020-02-28 DIAGNOSIS — J4541 Moderate persistent asthma with (acute) exacerbation: Secondary | ICD-10-CM | POA: Diagnosis not present

## 2020-02-28 DIAGNOSIS — K2100 Gastro-esophageal reflux disease with esophagitis, without bleeding: Secondary | ICD-10-CM | POA: Diagnosis not present

## 2020-02-28 DIAGNOSIS — K219 Gastro-esophageal reflux disease without esophagitis: Secondary | ICD-10-CM | POA: Diagnosis not present

## 2020-02-28 DIAGNOSIS — D170 Benign lipomatous neoplasm of skin and subcutaneous tissue of head, face and neck: Secondary | ICD-10-CM | POA: Diagnosis not present

## 2020-02-28 DIAGNOSIS — L03311 Cellulitis of abdominal wall: Secondary | ICD-10-CM | POA: Diagnosis not present

## 2020-02-28 DIAGNOSIS — Z9049 Acquired absence of other specified parts of digestive tract: Secondary | ICD-10-CM | POA: Diagnosis not present

## 2020-02-28 DIAGNOSIS — L02212 Cutaneous abscess of back [any part, except buttock]: Secondary | ICD-10-CM | POA: Diagnosis not present

## 2020-02-28 DIAGNOSIS — Z85820 Personal history of malignant melanoma of skin: Secondary | ICD-10-CM | POA: Diagnosis not present

## 2020-02-28 DIAGNOSIS — S61511A Laceration without foreign body of right wrist, initial encounter: Secondary | ICD-10-CM | POA: Diagnosis not present

## 2020-02-28 DIAGNOSIS — M2560 Stiffness of unspecified joint, not elsewhere classified: Secondary | ICD-10-CM | POA: Diagnosis not present

## 2020-02-28 DIAGNOSIS — M9900 Segmental and somatic dysfunction of head region: Secondary | ICD-10-CM | POA: Diagnosis not present

## 2020-02-28 DIAGNOSIS — K631 Perforation of intestine (nontraumatic): Secondary | ICD-10-CM | POA: Diagnosis not present

## 2020-02-28 DIAGNOSIS — R590 Localized enlarged lymph nodes: Secondary | ICD-10-CM | POA: Diagnosis not present

## 2020-02-28 DIAGNOSIS — R801 Persistent proteinuria, unspecified: Secondary | ICD-10-CM | POA: Diagnosis not present

## 2020-02-28 DIAGNOSIS — G5622 Lesion of ulnar nerve, left upper limb: Secondary | ICD-10-CM | POA: Diagnosis not present

## 2020-02-28 DIAGNOSIS — M25651 Stiffness of right hip, not elsewhere classified: Secondary | ICD-10-CM | POA: Diagnosis not present

## 2020-02-28 DIAGNOSIS — R1033 Periumbilical pain: Secondary | ICD-10-CM | POA: Diagnosis not present

## 2020-02-28 DIAGNOSIS — C61 Malignant neoplasm of prostate: Secondary | ICD-10-CM | POA: Diagnosis not present

## 2020-02-28 DIAGNOSIS — N946 Dysmenorrhea, unspecified: Secondary | ICD-10-CM | POA: Diagnosis not present

## 2020-02-28 DIAGNOSIS — E1140 Type 2 diabetes mellitus with diabetic neuropathy, unspecified: Secondary | ICD-10-CM | POA: Diagnosis not present

## 2020-02-28 DIAGNOSIS — Z86718 Personal history of other venous thrombosis and embolism: Secondary | ICD-10-CM | POA: Diagnosis not present

## 2020-02-28 DIAGNOSIS — Z170 Estrogen receptor positive status [ER+]: Secondary | ICD-10-CM | POA: Diagnosis not present

## 2020-02-28 DIAGNOSIS — R579 Shock, unspecified: Secondary | ICD-10-CM | POA: Diagnosis not present

## 2020-02-28 DIAGNOSIS — R636 Underweight: Secondary | ICD-10-CM | POA: Diagnosis not present

## 2020-02-28 DIAGNOSIS — I83811 Varicose veins of right lower extremities with pain: Secondary | ICD-10-CM | POA: Diagnosis not present

## 2020-02-28 DIAGNOSIS — S233XXA Sprain of ligaments of thoracic spine, initial encounter: Secondary | ICD-10-CM | POA: Diagnosis not present

## 2020-02-28 DIAGNOSIS — I213 ST elevation (STEMI) myocardial infarction of unspecified site: Secondary | ICD-10-CM | POA: Diagnosis not present

## 2020-02-28 DIAGNOSIS — M19011 Primary osteoarthritis, right shoulder: Secondary | ICD-10-CM | POA: Diagnosis not present

## 2020-02-28 DIAGNOSIS — S0990XA Unspecified injury of head, initial encounter: Secondary | ICD-10-CM | POA: Diagnosis not present

## 2020-02-28 DIAGNOSIS — H2513 Age-related nuclear cataract, bilateral: Secondary | ICD-10-CM | POA: Diagnosis not present

## 2020-02-28 DIAGNOSIS — R1111 Vomiting without nausea: Secondary | ICD-10-CM | POA: Diagnosis not present

## 2020-02-28 DIAGNOSIS — F439 Reaction to severe stress, unspecified: Secondary | ICD-10-CM | POA: Diagnosis not present

## 2020-02-28 DIAGNOSIS — Z6834 Body mass index (BMI) 34.0-34.9, adult: Secondary | ICD-10-CM | POA: Diagnosis not present

## 2020-02-28 DIAGNOSIS — E860 Dehydration: Secondary | ICD-10-CM | POA: Diagnosis not present

## 2020-02-28 DIAGNOSIS — J4520 Mild intermittent asthma, uncomplicated: Secondary | ICD-10-CM | POA: Diagnosis not present

## 2020-02-28 DIAGNOSIS — Z20822 Contact with and (suspected) exposure to covid-19: Secondary | ICD-10-CM | POA: Diagnosis not present

## 2020-02-28 DIAGNOSIS — C9000 Multiple myeloma not having achieved remission: Secondary | ICD-10-CM | POA: Diagnosis not present

## 2020-02-28 DIAGNOSIS — Z96643 Presence of artificial hip joint, bilateral: Secondary | ICD-10-CM | POA: Diagnosis not present

## 2020-02-28 DIAGNOSIS — K56609 Unspecified intestinal obstruction, unspecified as to partial versus complete obstruction: Secondary | ICD-10-CM | POA: Diagnosis not present

## 2020-02-28 DIAGNOSIS — S93431A Sprain of tibiofibular ligament of right ankle, initial encounter: Secondary | ICD-10-CM | POA: Diagnosis not present

## 2020-02-28 DIAGNOSIS — I83009 Varicose veins of unspecified lower extremity with ulcer of unspecified site: Secondary | ICD-10-CM | POA: Diagnosis not present

## 2020-02-28 DIAGNOSIS — Z7689 Persons encountering health services in other specified circumstances: Secondary | ICD-10-CM | POA: Diagnosis not present

## 2020-02-28 DIAGNOSIS — H5203 Hypermetropia, bilateral: Secondary | ICD-10-CM | POA: Diagnosis not present

## 2020-02-28 DIAGNOSIS — R05 Cough: Secondary | ICD-10-CM | POA: Diagnosis not present

## 2020-02-28 DIAGNOSIS — S92331A Displaced fracture of third metatarsal bone, right foot, initial encounter for closed fracture: Secondary | ICD-10-CM | POA: Diagnosis not present

## 2020-02-28 DIAGNOSIS — N611 Abscess of the breast and nipple: Secondary | ICD-10-CM | POA: Diagnosis not present

## 2020-02-28 DIAGNOSIS — Z8616 Personal history of COVID-19: Secondary | ICD-10-CM | POA: Diagnosis not present

## 2020-02-28 DIAGNOSIS — L900 Lichen sclerosus et atrophicus: Secondary | ICD-10-CM | POA: Diagnosis not present

## 2020-02-28 DIAGNOSIS — I493 Ventricular premature depolarization: Secondary | ICD-10-CM | POA: Diagnosis not present

## 2020-02-28 DIAGNOSIS — R928 Other abnormal and inconclusive findings on diagnostic imaging of breast: Secondary | ICD-10-CM | POA: Diagnosis not present

## 2020-02-28 DIAGNOSIS — H0100A Unspecified blepharitis right eye, upper and lower eyelids: Secondary | ICD-10-CM | POA: Diagnosis not present

## 2020-02-28 DIAGNOSIS — N202 Calculus of kidney with calculus of ureter: Secondary | ICD-10-CM | POA: Diagnosis not present

## 2020-02-28 DIAGNOSIS — R112 Nausea with vomiting, unspecified: Secondary | ICD-10-CM | POA: Diagnosis not present

## 2020-02-28 DIAGNOSIS — Z6823 Body mass index (BMI) 23.0-23.9, adult: Secondary | ICD-10-CM | POA: Diagnosis not present

## 2020-02-28 DIAGNOSIS — I4892 Unspecified atrial flutter: Secondary | ICD-10-CM | POA: Diagnosis not present

## 2020-02-28 DIAGNOSIS — I359 Nonrheumatic aortic valve disorder, unspecified: Secondary | ICD-10-CM | POA: Diagnosis not present

## 2020-02-28 DIAGNOSIS — E441 Mild protein-calorie malnutrition: Secondary | ICD-10-CM | POA: Diagnosis not present

## 2020-02-28 DIAGNOSIS — I483 Typical atrial flutter: Secondary | ICD-10-CM | POA: Diagnosis not present

## 2020-02-28 DIAGNOSIS — Z8781 Personal history of (healed) traumatic fracture: Secondary | ICD-10-CM | POA: Diagnosis not present

## 2020-02-28 DIAGNOSIS — C44319 Basal cell carcinoma of skin of other parts of face: Secondary | ICD-10-CM | POA: Diagnosis not present

## 2020-02-28 DIAGNOSIS — Z789 Other specified health status: Secondary | ICD-10-CM | POA: Diagnosis not present

## 2020-02-28 DIAGNOSIS — S060X0A Concussion without loss of consciousness, initial encounter: Secondary | ICD-10-CM | POA: Diagnosis not present

## 2020-02-28 DIAGNOSIS — M65332 Trigger finger, left middle finger: Secondary | ICD-10-CM | POA: Diagnosis not present

## 2020-02-28 DIAGNOSIS — L851 Acquired keratosis [keratoderma] palmaris et plantaris: Secondary | ICD-10-CM | POA: Diagnosis not present

## 2020-02-28 DIAGNOSIS — E0841 Diabetes mellitus due to underlying condition with diabetic mononeuropathy: Secondary | ICD-10-CM | POA: Diagnosis not present

## 2020-02-28 DIAGNOSIS — J984 Other disorders of lung: Secondary | ICD-10-CM | POA: Diagnosis not present

## 2020-02-28 DIAGNOSIS — R159 Full incontinence of feces: Secondary | ICD-10-CM | POA: Diagnosis not present

## 2020-02-28 DIAGNOSIS — H5319 Other subjective visual disturbances: Secondary | ICD-10-CM | POA: Diagnosis not present

## 2020-02-28 DIAGNOSIS — M65341 Trigger finger, right ring finger: Secondary | ICD-10-CM | POA: Diagnosis not present

## 2020-02-28 DIAGNOSIS — F600 Paranoid personality disorder: Secondary | ICD-10-CM | POA: Diagnosis not present

## 2020-02-28 DIAGNOSIS — Z86007 Personal history of in-situ neoplasm of skin: Secondary | ICD-10-CM | POA: Diagnosis not present

## 2020-02-28 DIAGNOSIS — M79642 Pain in left hand: Secondary | ICD-10-CM | POA: Diagnosis not present

## 2020-02-28 DIAGNOSIS — H25013 Cortical age-related cataract, bilateral: Secondary | ICD-10-CM | POA: Diagnosis not present

## 2020-02-28 DIAGNOSIS — R269 Unspecified abnormalities of gait and mobility: Secondary | ICD-10-CM | POA: Diagnosis not present

## 2020-02-28 DIAGNOSIS — R4182 Altered mental status, unspecified: Secondary | ICD-10-CM | POA: Diagnosis not present

## 2020-02-28 DIAGNOSIS — M79674 Pain in right toe(s): Secondary | ICD-10-CM | POA: Diagnosis not present

## 2020-02-28 DIAGNOSIS — Z91040 Latex allergy status: Secondary | ICD-10-CM | POA: Diagnosis not present

## 2020-02-28 DIAGNOSIS — N3281 Overactive bladder: Secondary | ICD-10-CM | POA: Diagnosis not present

## 2020-02-28 DIAGNOSIS — D500 Iron deficiency anemia secondary to blood loss (chronic): Secondary | ICD-10-CM | POA: Diagnosis not present

## 2020-02-28 DIAGNOSIS — M859 Disorder of bone density and structure, unspecified: Secondary | ICD-10-CM | POA: Diagnosis not present

## 2020-02-28 DIAGNOSIS — F325 Major depressive disorder, single episode, in full remission: Secondary | ICD-10-CM | POA: Diagnosis not present

## 2020-02-28 DIAGNOSIS — Z6835 Body mass index (BMI) 35.0-35.9, adult: Secondary | ICD-10-CM | POA: Diagnosis not present

## 2020-02-28 DIAGNOSIS — K743 Primary biliary cirrhosis: Secondary | ICD-10-CM | POA: Diagnosis not present

## 2020-02-28 DIAGNOSIS — M25811 Other specified joint disorders, right shoulder: Secondary | ICD-10-CM | POA: Diagnosis not present

## 2020-02-28 DIAGNOSIS — M19042 Primary osteoarthritis, left hand: Secondary | ICD-10-CM | POA: Diagnosis not present

## 2020-02-28 DIAGNOSIS — H26493 Other secondary cataract, bilateral: Secondary | ICD-10-CM | POA: Diagnosis not present

## 2020-02-28 DIAGNOSIS — H524 Presbyopia: Secondary | ICD-10-CM | POA: Diagnosis not present

## 2020-02-28 DIAGNOSIS — H0288B Meibomian gland dysfunction left eye, upper and lower eyelids: Secondary | ICD-10-CM | POA: Diagnosis not present

## 2020-02-28 DIAGNOSIS — G5602 Carpal tunnel syndrome, left upper limb: Secondary | ICD-10-CM | POA: Diagnosis not present

## 2020-02-28 DIAGNOSIS — J309 Allergic rhinitis, unspecified: Secondary | ICD-10-CM | POA: Diagnosis not present

## 2020-02-28 DIAGNOSIS — Z136 Encounter for screening for cardiovascular disorders: Secondary | ICD-10-CM | POA: Diagnosis not present

## 2020-02-28 DIAGNOSIS — M10379 Gout due to renal impairment, unspecified ankle and foot: Secondary | ICD-10-CM | POA: Diagnosis not present

## 2020-02-28 DIAGNOSIS — L97221 Non-pressure chronic ulcer of left calf limited to breakdown of skin: Secondary | ICD-10-CM | POA: Diagnosis not present

## 2020-02-28 DIAGNOSIS — I5020 Unspecified systolic (congestive) heart failure: Secondary | ICD-10-CM | POA: Diagnosis not present

## 2020-02-28 DIAGNOSIS — Z7983 Long term (current) use of bisphosphonates: Secondary | ICD-10-CM | POA: Diagnosis not present

## 2020-02-28 DIAGNOSIS — C50811 Malignant neoplasm of overlapping sites of right female breast: Secondary | ICD-10-CM | POA: Diagnosis not present

## 2020-02-28 DIAGNOSIS — Z712 Person consulting for explanation of examination or test findings: Secondary | ICD-10-CM | POA: Diagnosis not present

## 2020-02-28 DIAGNOSIS — R69 Illness, unspecified: Secondary | ICD-10-CM | POA: Diagnosis not present

## 2020-02-28 DIAGNOSIS — D225 Melanocytic nevi of trunk: Secondary | ICD-10-CM | POA: Diagnosis not present

## 2020-02-28 DIAGNOSIS — Z01812 Encounter for preprocedural laboratory examination: Secondary | ICD-10-CM | POA: Diagnosis not present

## 2020-02-28 DIAGNOSIS — H353221 Exudative age-related macular degeneration, left eye, with active choroidal neovascularization: Secondary | ICD-10-CM | POA: Diagnosis not present

## 2020-02-28 DIAGNOSIS — D2272 Melanocytic nevi of left lower limb, including hip: Secondary | ICD-10-CM | POA: Diagnosis not present

## 2020-02-28 DIAGNOSIS — N433 Hydrocele, unspecified: Secondary | ICD-10-CM | POA: Diagnosis not present

## 2020-02-28 DIAGNOSIS — R935 Abnormal findings on diagnostic imaging of other abdominal regions, including retroperitoneum: Secondary | ICD-10-CM | POA: Diagnosis not present

## 2020-02-28 DIAGNOSIS — R1084 Generalized abdominal pain: Secondary | ICD-10-CM | POA: Diagnosis not present

## 2020-02-28 DIAGNOSIS — M25611 Stiffness of right shoulder, not elsewhere classified: Secondary | ICD-10-CM | POA: Diagnosis not present

## 2020-02-28 DIAGNOSIS — D631 Anemia in chronic kidney disease: Secondary | ICD-10-CM | POA: Diagnosis not present

## 2020-02-28 DIAGNOSIS — G43909 Migraine, unspecified, not intractable, without status migrainosus: Secondary | ICD-10-CM | POA: Diagnosis not present

## 2020-02-28 DIAGNOSIS — M21722 Unequal limb length (acquired), left humerus: Secondary | ICD-10-CM | POA: Diagnosis not present

## 2020-02-28 DIAGNOSIS — Z96651 Presence of right artificial knee joint: Secondary | ICD-10-CM | POA: Diagnosis not present

## 2020-02-28 DIAGNOSIS — N183 Chronic kidney disease, stage 3 unspecified: Secondary | ICD-10-CM | POA: Diagnosis not present

## 2020-02-28 DIAGNOSIS — K4020 Bilateral inguinal hernia, without obstruction or gangrene, not specified as recurrent: Secondary | ICD-10-CM | POA: Diagnosis not present

## 2020-02-28 DIAGNOSIS — N858 Other specified noninflammatory disorders of uterus: Secondary | ICD-10-CM | POA: Diagnosis not present

## 2020-02-28 DIAGNOSIS — H0288A Meibomian gland dysfunction right eye, upper and lower eyelids: Secondary | ICD-10-CM | POA: Diagnosis not present

## 2020-02-28 DIAGNOSIS — Z79899 Other long term (current) drug therapy: Secondary | ICD-10-CM | POA: Diagnosis not present

## 2020-02-28 DIAGNOSIS — K8309 Other cholangitis: Secondary | ICD-10-CM | POA: Diagnosis not present

## 2020-02-28 DIAGNOSIS — I6782 Cerebral ischemia: Secondary | ICD-10-CM | POA: Diagnosis not present

## 2020-02-28 DIAGNOSIS — L89151 Pressure ulcer of sacral region, stage 1: Secondary | ICD-10-CM | POA: Diagnosis not present

## 2020-02-28 DIAGNOSIS — Z8249 Family history of ischemic heart disease and other diseases of the circulatory system: Secondary | ICD-10-CM | POA: Diagnosis not present

## 2020-02-28 DIAGNOSIS — Z66 Do not resuscitate: Secondary | ICD-10-CM | POA: Diagnosis not present

## 2020-02-28 DIAGNOSIS — M80052D Age-related osteoporosis with current pathological fracture, left femur, subsequent encounter for fracture with routine healing: Secondary | ICD-10-CM | POA: Diagnosis not present

## 2020-02-29 DIAGNOSIS — Z87892 Personal history of anaphylaxis: Secondary | ICD-10-CM | POA: Diagnosis not present

## 2020-02-29 DIAGNOSIS — E890 Postprocedural hypothyroidism: Secondary | ICD-10-CM | POA: Diagnosis not present

## 2020-02-29 DIAGNOSIS — K5732 Diverticulitis of large intestine without perforation or abscess without bleeding: Secondary | ICD-10-CM | POA: Diagnosis not present

## 2020-02-29 DIAGNOSIS — M9901 Segmental and somatic dysfunction of cervical region: Secondary | ICD-10-CM | POA: Diagnosis not present

## 2020-02-29 DIAGNOSIS — T50905A Adverse effect of unspecified drugs, medicaments and biological substances, initial encounter: Secondary | ICD-10-CM | POA: Diagnosis not present

## 2020-02-29 DIAGNOSIS — C3411 Malignant neoplasm of upper lobe, right bronchus or lung: Secondary | ICD-10-CM | POA: Diagnosis not present

## 2020-02-29 DIAGNOSIS — I708 Atherosclerosis of other arteries: Secondary | ICD-10-CM | POA: Diagnosis not present

## 2020-02-29 DIAGNOSIS — H9213 Otorrhea, bilateral: Secondary | ICD-10-CM | POA: Diagnosis not present

## 2020-02-29 DIAGNOSIS — F5101 Primary insomnia: Secondary | ICD-10-CM | POA: Diagnosis not present

## 2020-02-29 DIAGNOSIS — S98111D Complete traumatic amputation of right great toe, subsequent encounter: Secondary | ICD-10-CM | POA: Diagnosis not present

## 2020-02-29 DIAGNOSIS — B349 Viral infection, unspecified: Secondary | ICD-10-CM | POA: Diagnosis not present

## 2020-02-29 DIAGNOSIS — K649 Unspecified hemorrhoids: Secondary | ICD-10-CM | POA: Diagnosis not present

## 2020-02-29 DIAGNOSIS — I340 Nonrheumatic mitral (valve) insufficiency: Secondary | ICD-10-CM | POA: Diagnosis not present

## 2020-02-29 DIAGNOSIS — D631 Anemia in chronic kidney disease: Secondary | ICD-10-CM | POA: Diagnosis not present

## 2020-02-29 DIAGNOSIS — D479 Neoplasm of uncertain behavior of lymphoid, hematopoietic and related tissue, unspecified: Secondary | ICD-10-CM | POA: Diagnosis not present

## 2020-02-29 DIAGNOSIS — C538 Malignant neoplasm of overlapping sites of cervix uteri: Secondary | ICD-10-CM | POA: Diagnosis not present

## 2020-02-29 DIAGNOSIS — Z95810 Presence of automatic (implantable) cardiac defibrillator: Secondary | ICD-10-CM | POA: Diagnosis not present

## 2020-02-29 DIAGNOSIS — L853 Xerosis cutis: Secondary | ICD-10-CM | POA: Diagnosis not present

## 2020-02-29 DIAGNOSIS — L97811 Non-pressure chronic ulcer of other part of right lower leg limited to breakdown of skin: Secondary | ICD-10-CM | POA: Diagnosis not present

## 2020-02-29 DIAGNOSIS — J9600 Acute respiratory failure, unspecified whether with hypoxia or hypercapnia: Secondary | ICD-10-CM | POA: Diagnosis not present

## 2020-02-29 DIAGNOSIS — M999 Biomechanical lesion, unspecified: Secondary | ICD-10-CM | POA: Diagnosis not present

## 2020-02-29 DIAGNOSIS — E039 Hypothyroidism, unspecified: Secondary | ICD-10-CM | POA: Diagnosis not present

## 2020-02-29 DIAGNOSIS — F3340 Major depressive disorder, recurrent, in remission, unspecified: Secondary | ICD-10-CM | POA: Diagnosis not present

## 2020-02-29 DIAGNOSIS — X32XXXA Exposure to sunlight, initial encounter: Secondary | ICD-10-CM | POA: Diagnosis not present

## 2020-02-29 DIAGNOSIS — R9431 Abnormal electrocardiogram [ECG] [EKG]: Secondary | ICD-10-CM | POA: Diagnosis not present

## 2020-02-29 DIAGNOSIS — I639 Cerebral infarction, unspecified: Secondary | ICD-10-CM | POA: Diagnosis not present

## 2020-02-29 DIAGNOSIS — H16223 Keratoconjunctivitis sicca, not specified as Sjogren's, bilateral: Secondary | ICD-10-CM | POA: Diagnosis not present

## 2020-02-29 DIAGNOSIS — M2010 Hallux valgus (acquired), unspecified foot: Secondary | ICD-10-CM | POA: Diagnosis not present

## 2020-02-29 DIAGNOSIS — C44529 Squamous cell carcinoma of skin of other part of trunk: Secondary | ICD-10-CM | POA: Diagnosis not present

## 2020-02-29 DIAGNOSIS — F330 Major depressive disorder, recurrent, mild: Secondary | ICD-10-CM | POA: Diagnosis not present

## 2020-02-29 DIAGNOSIS — R443 Hallucinations, unspecified: Secondary | ICD-10-CM | POA: Diagnosis not present

## 2020-02-29 DIAGNOSIS — J0100 Acute maxillary sinusitis, unspecified: Secondary | ICD-10-CM | POA: Diagnosis not present

## 2020-02-29 DIAGNOSIS — K567 Ileus, unspecified: Secondary | ICD-10-CM | POA: Diagnosis not present

## 2020-02-29 DIAGNOSIS — J020 Streptococcal pharyngitis: Secondary | ICD-10-CM | POA: Diagnosis not present

## 2020-02-29 DIAGNOSIS — G43709 Chronic migraine without aura, not intractable, without status migrainosus: Secondary | ICD-10-CM | POA: Diagnosis not present

## 2020-02-29 DIAGNOSIS — Z87828 Personal history of other (healed) physical injury and trauma: Secondary | ICD-10-CM | POA: Diagnosis not present

## 2020-02-29 DIAGNOSIS — H21541 Posterior synechiae (iris), right eye: Secondary | ICD-10-CM | POA: Diagnosis not present

## 2020-02-29 DIAGNOSIS — Y842 Radiological procedure and radiotherapy as the cause of abnormal reaction of the patient, or of later complication, without mention of misadventure at the time of the procedure: Secondary | ICD-10-CM | POA: Diagnosis not present

## 2020-02-29 DIAGNOSIS — M2391 Unspecified internal derangement of right knee: Secondary | ICD-10-CM | POA: Diagnosis not present

## 2020-02-29 DIAGNOSIS — R2242 Localized swelling, mass and lump, left lower limb: Secondary | ICD-10-CM | POA: Diagnosis not present

## 2020-02-29 DIAGNOSIS — L609 Nail disorder, unspecified: Secondary | ICD-10-CM | POA: Diagnosis not present

## 2020-02-29 DIAGNOSIS — L309 Dermatitis, unspecified: Secondary | ICD-10-CM | POA: Diagnosis not present

## 2020-02-29 DIAGNOSIS — K6389 Other specified diseases of intestine: Secondary | ICD-10-CM | POA: Diagnosis not present

## 2020-02-29 DIAGNOSIS — Z7409 Other reduced mobility: Secondary | ICD-10-CM | POA: Diagnosis not present

## 2020-02-29 DIAGNOSIS — M79673 Pain in unspecified foot: Secondary | ICD-10-CM | POA: Diagnosis not present

## 2020-02-29 DIAGNOSIS — Z6836 Body mass index (BMI) 36.0-36.9, adult: Secondary | ICD-10-CM | POA: Diagnosis not present

## 2020-02-29 DIAGNOSIS — R809 Proteinuria, unspecified: Secondary | ICD-10-CM | POA: Diagnosis not present

## 2020-02-29 DIAGNOSIS — Z8546 Personal history of malignant neoplasm of prostate: Secondary | ICD-10-CM | POA: Diagnosis not present

## 2020-02-29 DIAGNOSIS — M170 Bilateral primary osteoarthritis of knee: Secondary | ICD-10-CM | POA: Diagnosis not present

## 2020-02-29 DIAGNOSIS — H40013 Open angle with borderline findings, low risk, bilateral: Secondary | ICD-10-CM | POA: Diagnosis not present

## 2020-02-29 DIAGNOSIS — D2239 Melanocytic nevi of other parts of face: Secondary | ICD-10-CM | POA: Diagnosis not present

## 2020-02-29 DIAGNOSIS — J8410 Pulmonary fibrosis, unspecified: Secondary | ICD-10-CM | POA: Diagnosis not present

## 2020-02-29 DIAGNOSIS — N1832 Chronic kidney disease, stage 3b: Secondary | ICD-10-CM | POA: Diagnosis not present

## 2020-02-29 DIAGNOSIS — N179 Acute kidney failure, unspecified: Secondary | ICD-10-CM | POA: Diagnosis not present

## 2020-02-29 DIAGNOSIS — I82443 Acute embolism and thrombosis of tibial vein, bilateral: Secondary | ICD-10-CM | POA: Diagnosis not present

## 2020-02-29 DIAGNOSIS — M25612 Stiffness of left shoulder, not elsewhere classified: Secondary | ICD-10-CM | POA: Diagnosis not present

## 2020-02-29 DIAGNOSIS — M5030 Other cervical disc degeneration, unspecified cervical region: Secondary | ICD-10-CM | POA: Diagnosis not present

## 2020-02-29 DIAGNOSIS — A000 Cholera due to Vibrio cholerae 01, biovar cholerae: Secondary | ICD-10-CM | POA: Diagnosis not present

## 2020-02-29 DIAGNOSIS — I441 Atrioventricular block, second degree: Secondary | ICD-10-CM | POA: Diagnosis not present

## 2020-02-29 DIAGNOSIS — H26491 Other secondary cataract, right eye: Secondary | ICD-10-CM | POA: Diagnosis not present

## 2020-02-29 DIAGNOSIS — Z8709 Personal history of other diseases of the respiratory system: Secondary | ICD-10-CM | POA: Diagnosis not present

## 2020-02-29 DIAGNOSIS — N904 Leukoplakia of vulva: Secondary | ICD-10-CM | POA: Diagnosis not present

## 2020-02-29 DIAGNOSIS — B0229 Other postherpetic nervous system involvement: Secondary | ICD-10-CM | POA: Diagnosis not present

## 2020-02-29 DIAGNOSIS — M79604 Pain in right leg: Secondary | ICD-10-CM | POA: Diagnosis not present

## 2020-02-29 DIAGNOSIS — M545 Low back pain: Secondary | ICD-10-CM | POA: Diagnosis not present

## 2020-02-29 DIAGNOSIS — M19022 Primary osteoarthritis, left elbow: Secondary | ICD-10-CM | POA: Diagnosis not present

## 2020-02-29 DIAGNOSIS — I442 Atrioventricular block, complete: Secondary | ICD-10-CM | POA: Diagnosis not present

## 2020-02-29 DIAGNOSIS — R700 Elevated erythrocyte sedimentation rate: Secondary | ICD-10-CM | POA: Diagnosis not present

## 2020-02-29 DIAGNOSIS — R54 Age-related physical debility: Secondary | ICD-10-CM | POA: Diagnosis not present

## 2020-02-29 DIAGNOSIS — I129 Hypertensive chronic kidney disease with stage 1 through stage 4 chronic kidney disease, or unspecified chronic kidney disease: Secondary | ICD-10-CM | POA: Diagnosis not present

## 2020-02-29 DIAGNOSIS — L03116 Cellulitis of left lower limb: Secondary | ICD-10-CM | POA: Diagnosis not present

## 2020-02-29 DIAGNOSIS — S70361A Insect bite (nonvenomous), right thigh, initial encounter: Secondary | ICD-10-CM | POA: Diagnosis not present

## 2020-02-29 DIAGNOSIS — M6289 Other specified disorders of muscle: Secondary | ICD-10-CM | POA: Diagnosis not present

## 2020-02-29 DIAGNOSIS — I2723 Pulmonary hypertension due to lung diseases and hypoxia: Secondary | ICD-10-CM | POA: Diagnosis not present

## 2020-02-29 DIAGNOSIS — G8911 Acute pain due to trauma: Secondary | ICD-10-CM | POA: Diagnosis not present

## 2020-02-29 DIAGNOSIS — R1319 Other dysphagia: Secondary | ICD-10-CM | POA: Diagnosis not present

## 2020-02-29 DIAGNOSIS — M25675 Stiffness of left foot, not elsewhere classified: Secondary | ICD-10-CM | POA: Diagnosis not present

## 2020-02-29 DIAGNOSIS — E1142 Type 2 diabetes mellitus with diabetic polyneuropathy: Secondary | ICD-10-CM | POA: Diagnosis not present

## 2020-02-29 DIAGNOSIS — B360 Pityriasis versicolor: Secondary | ICD-10-CM | POA: Diagnosis not present

## 2020-02-29 DIAGNOSIS — G4700 Insomnia, unspecified: Secondary | ICD-10-CM | POA: Diagnosis not present

## 2020-02-29 DIAGNOSIS — J9811 Atelectasis: Secondary | ICD-10-CM | POA: Diagnosis not present

## 2020-02-29 DIAGNOSIS — C44622 Squamous cell carcinoma of skin of right upper limb, including shoulder: Secondary | ICD-10-CM | POA: Diagnosis not present

## 2020-02-29 DIAGNOSIS — K838 Other specified diseases of biliary tract: Secondary | ICD-10-CM | POA: Diagnosis not present

## 2020-02-29 DIAGNOSIS — Z6821 Body mass index (BMI) 21.0-21.9, adult: Secondary | ICD-10-CM | POA: Diagnosis not present

## 2020-02-29 DIAGNOSIS — T82837A Hemorrhage of cardiac prosthetic devices, implants and grafts, initial encounter: Secondary | ICD-10-CM | POA: Diagnosis not present

## 2020-02-29 DIAGNOSIS — K5902 Outlet dysfunction constipation: Secondary | ICD-10-CM | POA: Diagnosis not present

## 2020-02-29 DIAGNOSIS — R922 Inconclusive mammogram: Secondary | ICD-10-CM | POA: Diagnosis not present

## 2020-02-29 DIAGNOSIS — H18003 Unspecified corneal deposit, bilateral: Secondary | ICD-10-CM | POA: Diagnosis not present

## 2020-02-29 DIAGNOSIS — Z96641 Presence of right artificial hip joint: Secondary | ICD-10-CM | POA: Diagnosis not present

## 2020-02-29 DIAGNOSIS — J019 Acute sinusitis, unspecified: Secondary | ICD-10-CM | POA: Diagnosis not present

## 2020-02-29 DIAGNOSIS — R000 Tachycardia, unspecified: Secondary | ICD-10-CM | POA: Diagnosis not present

## 2020-02-29 DIAGNOSIS — B353 Tinea pedis: Secondary | ICD-10-CM | POA: Diagnosis not present

## 2020-02-29 DIAGNOSIS — I252 Old myocardial infarction: Secondary | ICD-10-CM | POA: Diagnosis not present

## 2020-02-29 DIAGNOSIS — Z96651 Presence of right artificial knee joint: Secondary | ICD-10-CM | POA: Diagnosis not present

## 2020-02-29 DIAGNOSIS — L120 Bullous pemphigoid: Secondary | ICD-10-CM | POA: Diagnosis not present

## 2020-02-29 DIAGNOSIS — K6289 Other specified diseases of anus and rectum: Secondary | ICD-10-CM | POA: Diagnosis not present

## 2020-02-29 DIAGNOSIS — Z9682 Presence of neurostimulator: Secondary | ICD-10-CM | POA: Diagnosis not present

## 2020-02-29 DIAGNOSIS — G5621 Lesion of ulnar nerve, right upper limb: Secondary | ICD-10-CM | POA: Diagnosis not present

## 2020-02-29 DIAGNOSIS — Z882 Allergy status to sulfonamides status: Secondary | ICD-10-CM | POA: Diagnosis not present

## 2020-02-29 DIAGNOSIS — D259 Leiomyoma of uterus, unspecified: Secondary | ICD-10-CM | POA: Diagnosis not present

## 2020-02-29 DIAGNOSIS — R1114 Bilious vomiting: Secondary | ICD-10-CM | POA: Diagnosis not present

## 2020-02-29 DIAGNOSIS — Z6824 Body mass index (BMI) 24.0-24.9, adult: Secondary | ICD-10-CM | POA: Diagnosis not present

## 2020-02-29 DIAGNOSIS — E1140 Type 2 diabetes mellitus with diabetic neuropathy, unspecified: Secondary | ICD-10-CM | POA: Diagnosis not present

## 2020-02-29 DIAGNOSIS — E441 Mild protein-calorie malnutrition: Secondary | ICD-10-CM | POA: Diagnosis not present

## 2020-02-29 DIAGNOSIS — Z1212 Encounter for screening for malignant neoplasm of rectum: Secondary | ICD-10-CM | POA: Diagnosis not present

## 2020-02-29 DIAGNOSIS — E042 Nontoxic multinodular goiter: Secondary | ICD-10-CM | POA: Diagnosis not present

## 2020-02-29 DIAGNOSIS — F515 Nightmare disorder: Secondary | ICD-10-CM | POA: Diagnosis not present

## 2020-02-29 DIAGNOSIS — E291 Testicular hypofunction: Secondary | ICD-10-CM | POA: Diagnosis not present

## 2020-02-29 DIAGNOSIS — E519 Thiamine deficiency, unspecified: Secondary | ICD-10-CM | POA: Diagnosis not present

## 2020-02-29 DIAGNOSIS — R198 Other specified symptoms and signs involving the digestive system and abdomen: Secondary | ICD-10-CM | POA: Diagnosis not present

## 2020-02-29 DIAGNOSIS — L719 Rosacea, unspecified: Secondary | ICD-10-CM | POA: Diagnosis not present

## 2020-02-29 DIAGNOSIS — S82831A Other fracture of upper and lower end of right fibula, initial encounter for closed fracture: Secondary | ICD-10-CM | POA: Diagnosis not present

## 2020-02-29 DIAGNOSIS — M542 Cervicalgia: Secondary | ICD-10-CM | POA: Diagnosis not present

## 2020-02-29 DIAGNOSIS — E782 Mixed hyperlipidemia: Secondary | ICD-10-CM | POA: Diagnosis not present

## 2020-02-29 DIAGNOSIS — L989 Disorder of the skin and subcutaneous tissue, unspecified: Secondary | ICD-10-CM | POA: Diagnosis not present

## 2020-02-29 DIAGNOSIS — H524 Presbyopia: Secondary | ICD-10-CM | POA: Diagnosis not present

## 2020-02-29 DIAGNOSIS — M7581 Other shoulder lesions, right shoulder: Secondary | ICD-10-CM | POA: Diagnosis not present

## 2020-02-29 DIAGNOSIS — M7060 Trochanteric bursitis, unspecified hip: Secondary | ICD-10-CM | POA: Diagnosis not present

## 2020-02-29 DIAGNOSIS — Z720 Tobacco use: Secondary | ICD-10-CM | POA: Diagnosis not present

## 2020-02-29 DIAGNOSIS — N3281 Overactive bladder: Secondary | ICD-10-CM | POA: Diagnosis not present

## 2020-02-29 DIAGNOSIS — G250 Essential tremor: Secondary | ICD-10-CM | POA: Diagnosis not present

## 2020-02-29 DIAGNOSIS — Z6820 Body mass index (BMI) 20.0-20.9, adult: Secondary | ICD-10-CM | POA: Diagnosis not present

## 2020-02-29 DIAGNOSIS — M25572 Pain in left ankle and joints of left foot: Secondary | ICD-10-CM | POA: Diagnosis not present

## 2020-02-29 DIAGNOSIS — E663 Overweight: Secondary | ICD-10-CM | POA: Diagnosis not present

## 2020-02-29 DIAGNOSIS — N2581 Secondary hyperparathyroidism of renal origin: Secondary | ICD-10-CM | POA: Diagnosis not present

## 2020-02-29 DIAGNOSIS — M5032 Other cervical disc degeneration, mid-cervical region, unspecified level: Secondary | ICD-10-CM | POA: Diagnosis not present

## 2020-02-29 DIAGNOSIS — R600 Localized edema: Secondary | ICD-10-CM | POA: Diagnosis not present

## 2020-02-29 DIAGNOSIS — N138 Other obstructive and reflux uropathy: Secondary | ICD-10-CM | POA: Diagnosis not present

## 2020-02-29 DIAGNOSIS — D72829 Elevated white blood cell count, unspecified: Secondary | ICD-10-CM | POA: Diagnosis not present

## 2020-02-29 DIAGNOSIS — Q631 Lobulated, fused and horseshoe kidney: Secondary | ICD-10-CM | POA: Diagnosis not present

## 2020-02-29 DIAGNOSIS — Z77090 Contact with and (suspected) exposure to asbestos: Secondary | ICD-10-CM | POA: Diagnosis not present

## 2020-02-29 DIAGNOSIS — I5023 Acute on chronic systolic (congestive) heart failure: Secondary | ICD-10-CM | POA: Diagnosis not present

## 2020-02-29 DIAGNOSIS — M47817 Spondylosis without myelopathy or radiculopathy, lumbosacral region: Secondary | ICD-10-CM | POA: Diagnosis not present

## 2020-02-29 DIAGNOSIS — Z6833 Body mass index (BMI) 33.0-33.9, adult: Secondary | ICD-10-CM | POA: Diagnosis not present

## 2020-02-29 DIAGNOSIS — Z6835 Body mass index (BMI) 35.0-35.9, adult: Secondary | ICD-10-CM | POA: Diagnosis not present

## 2020-02-29 DIAGNOSIS — H52223 Regular astigmatism, bilateral: Secondary | ICD-10-CM | POA: Diagnosis not present

## 2020-02-29 DIAGNOSIS — E790 Hyperuricemia without signs of inflammatory arthritis and tophaceous disease: Secondary | ICD-10-CM | POA: Diagnosis not present

## 2020-02-29 DIAGNOSIS — T8521XD Breakdown (mechanical) of intraocular lens, subsequent encounter: Secondary | ICD-10-CM | POA: Diagnosis not present

## 2020-02-29 DIAGNOSIS — E1159 Type 2 diabetes mellitus with other circulatory complications: Secondary | ICD-10-CM | POA: Diagnosis not present

## 2020-02-29 DIAGNOSIS — R6884 Jaw pain: Secondary | ICD-10-CM | POA: Diagnosis not present

## 2020-02-29 DIAGNOSIS — Z8042 Family history of malignant neoplasm of prostate: Secondary | ICD-10-CM | POA: Diagnosis not present

## 2020-02-29 DIAGNOSIS — E0843 Diabetes mellitus due to underlying condition with diabetic autonomic (poly)neuropathy: Secondary | ICD-10-CM | POA: Diagnosis not present

## 2020-02-29 DIAGNOSIS — C50912 Malignant neoplasm of unspecified site of left female breast: Secondary | ICD-10-CM | POA: Diagnosis not present

## 2020-02-29 DIAGNOSIS — F3341 Major depressive disorder, recurrent, in partial remission: Secondary | ICD-10-CM | POA: Diagnosis not present

## 2020-02-29 DIAGNOSIS — G4730 Sleep apnea, unspecified: Secondary | ICD-10-CM | POA: Diagnosis not present

## 2020-02-29 DIAGNOSIS — M79644 Pain in right finger(s): Secondary | ICD-10-CM | POA: Diagnosis not present

## 2020-02-29 DIAGNOSIS — M50223 Other cervical disc displacement at C6-C7 level: Secondary | ICD-10-CM | POA: Diagnosis not present

## 2020-02-29 DIAGNOSIS — H47093 Other disorders of optic nerve, not elsewhere classified, bilateral: Secondary | ICD-10-CM | POA: Diagnosis not present

## 2020-02-29 DIAGNOSIS — M2041 Other hammer toe(s) (acquired), right foot: Secondary | ICD-10-CM | POA: Diagnosis not present

## 2020-02-29 DIAGNOSIS — F0390 Unspecified dementia without behavioral disturbance: Secondary | ICD-10-CM | POA: Diagnosis not present

## 2020-02-29 DIAGNOSIS — I890 Lymphedema, not elsewhere classified: Secondary | ICD-10-CM | POA: Diagnosis not present

## 2020-02-29 DIAGNOSIS — Z7984 Long term (current) use of oral hypoglycemic drugs: Secondary | ICD-10-CM | POA: Diagnosis not present

## 2020-02-29 DIAGNOSIS — H35363 Drusen (degenerative) of macula, bilateral: Secondary | ICD-10-CM | POA: Diagnosis not present

## 2020-02-29 DIAGNOSIS — W19XXXA Unspecified fall, initial encounter: Secondary | ICD-10-CM | POA: Diagnosis not present

## 2020-02-29 DIAGNOSIS — N401 Enlarged prostate with lower urinary tract symptoms: Secondary | ICD-10-CM | POA: Diagnosis not present

## 2020-02-29 DIAGNOSIS — J310 Chronic rhinitis: Secondary | ICD-10-CM | POA: Diagnosis not present

## 2020-02-29 DIAGNOSIS — R946 Abnormal results of thyroid function studies: Secondary | ICD-10-CM | POA: Diagnosis not present

## 2020-02-29 DIAGNOSIS — H401122 Primary open-angle glaucoma, left eye, moderate stage: Secondary | ICD-10-CM | POA: Diagnosis not present

## 2020-02-29 DIAGNOSIS — E1143 Type 2 diabetes mellitus with diabetic autonomic (poly)neuropathy: Secondary | ICD-10-CM | POA: Diagnosis not present

## 2020-02-29 DIAGNOSIS — S46001D Unspecified injury of muscle(s) and tendon(s) of the rotator cuff of right shoulder, subsequent encounter: Secondary | ICD-10-CM | POA: Diagnosis not present

## 2020-02-29 DIAGNOSIS — Z6841 Body Mass Index (BMI) 40.0 and over, adult: Secondary | ICD-10-CM | POA: Diagnosis not present

## 2020-02-29 DIAGNOSIS — M25461 Effusion, right knee: Secondary | ICD-10-CM | POA: Diagnosis not present

## 2020-02-29 DIAGNOSIS — M7711 Lateral epicondylitis, right elbow: Secondary | ICD-10-CM | POA: Diagnosis not present

## 2020-02-29 DIAGNOSIS — L819 Disorder of pigmentation, unspecified: Secondary | ICD-10-CM | POA: Diagnosis not present

## 2020-02-29 DIAGNOSIS — M25551 Pain in right hip: Secondary | ICD-10-CM | POA: Diagnosis not present

## 2020-02-29 DIAGNOSIS — M0579 Rheumatoid arthritis with rheumatoid factor of multiple sites without organ or systems involvement: Secondary | ICD-10-CM | POA: Diagnosis not present

## 2020-02-29 DIAGNOSIS — Z9689 Presence of other specified functional implants: Secondary | ICD-10-CM | POA: Diagnosis not present

## 2020-02-29 DIAGNOSIS — H16142 Punctate keratitis, left eye: Secondary | ICD-10-CM | POA: Diagnosis not present

## 2020-02-29 DIAGNOSIS — J431 Panlobular emphysema: Secondary | ICD-10-CM | POA: Diagnosis not present

## 2020-02-29 DIAGNOSIS — M1812 Unilateral primary osteoarthritis of first carpometacarpal joint, left hand: Secondary | ICD-10-CM | POA: Diagnosis not present

## 2020-02-29 DIAGNOSIS — E113393 Type 2 diabetes mellitus with moderate nonproliferative diabetic retinopathy without macular edema, bilateral: Secondary | ICD-10-CM | POA: Diagnosis not present

## 2020-02-29 DIAGNOSIS — R296 Repeated falls: Secondary | ICD-10-CM | POA: Diagnosis not present

## 2020-02-29 DIAGNOSIS — R7301 Impaired fasting glucose: Secondary | ICD-10-CM | POA: Diagnosis not present

## 2020-02-29 DIAGNOSIS — G63 Polyneuropathy in diseases classified elsewhere: Secondary | ICD-10-CM | POA: Diagnosis not present

## 2020-02-29 DIAGNOSIS — R1011 Right upper quadrant pain: Secondary | ICD-10-CM | POA: Diagnosis not present

## 2020-02-29 DIAGNOSIS — H8112 Benign paroxysmal vertigo, left ear: Secondary | ICD-10-CM | POA: Diagnosis not present

## 2020-02-29 DIAGNOSIS — K279 Peptic ulcer, site unspecified, unspecified as acute or chronic, without hemorrhage or perforation: Secondary | ICD-10-CM | POA: Diagnosis not present

## 2020-02-29 DIAGNOSIS — R590 Localized enlarged lymph nodes: Secondary | ICD-10-CM | POA: Diagnosis not present

## 2020-02-29 DIAGNOSIS — M7731 Calcaneal spur, right foot: Secondary | ICD-10-CM | POA: Diagnosis not present

## 2020-02-29 DIAGNOSIS — E559 Vitamin D deficiency, unspecified: Secondary | ICD-10-CM | POA: Diagnosis not present

## 2020-02-29 DIAGNOSIS — Z20822 Contact with and (suspected) exposure to covid-19: Secondary | ICD-10-CM | POA: Diagnosis not present

## 2020-02-29 DIAGNOSIS — H0100A Unspecified blepharitis right eye, upper and lower eyelids: Secondary | ICD-10-CM | POA: Diagnosis not present

## 2020-02-29 DIAGNOSIS — G2581 Restless legs syndrome: Secondary | ICD-10-CM | POA: Diagnosis not present

## 2020-02-29 DIAGNOSIS — R195 Other fecal abnormalities: Secondary | ICD-10-CM | POA: Diagnosis not present

## 2020-02-29 DIAGNOSIS — Z8631 Personal history of diabetic foot ulcer: Secondary | ICD-10-CM | POA: Diagnosis not present

## 2020-02-29 DIAGNOSIS — R319 Hematuria, unspecified: Secondary | ICD-10-CM | POA: Diagnosis not present

## 2020-02-29 DIAGNOSIS — Z85820 Personal history of malignant melanoma of skin: Secondary | ICD-10-CM | POA: Diagnosis not present

## 2020-02-29 DIAGNOSIS — H5702 Anisocoria: Secondary | ICD-10-CM | POA: Diagnosis not present

## 2020-02-29 DIAGNOSIS — R350 Frequency of micturition: Secondary | ICD-10-CM | POA: Diagnosis not present

## 2020-02-29 DIAGNOSIS — H547 Unspecified visual loss: Secondary | ICD-10-CM | POA: Diagnosis not present

## 2020-02-29 DIAGNOSIS — Z9013 Acquired absence of bilateral breasts and nipples: Secondary | ICD-10-CM | POA: Diagnosis not present

## 2020-02-29 DIAGNOSIS — M4316 Spondylolisthesis, lumbar region: Secondary | ICD-10-CM | POA: Diagnosis not present

## 2020-02-29 DIAGNOSIS — D6489 Other specified anemias: Secondary | ICD-10-CM | POA: Diagnosis not present

## 2020-02-29 DIAGNOSIS — T25322A Burn of third degree of left foot, initial encounter: Secondary | ICD-10-CM | POA: Diagnosis not present

## 2020-02-29 DIAGNOSIS — M5481 Occipital neuralgia: Secondary | ICD-10-CM | POA: Diagnosis not present

## 2020-02-29 DIAGNOSIS — Z1339 Encounter for screening examination for other mental health and behavioral disorders: Secondary | ICD-10-CM | POA: Diagnosis not present

## 2020-02-29 DIAGNOSIS — I130 Hypertensive heart and chronic kidney disease with heart failure and stage 1 through stage 4 chronic kidney disease, or unspecified chronic kidney disease: Secondary | ICD-10-CM | POA: Diagnosis not present

## 2020-02-29 DIAGNOSIS — D2261 Melanocytic nevi of right upper limb, including shoulder: Secondary | ICD-10-CM | POA: Diagnosis not present

## 2020-02-29 DIAGNOSIS — L600 Ingrowing nail: Secondary | ICD-10-CM | POA: Diagnosis not present

## 2020-02-29 DIAGNOSIS — C9000 Multiple myeloma not having achieved remission: Secondary | ICD-10-CM | POA: Diagnosis not present

## 2020-02-29 DIAGNOSIS — T8029XA Infection following other infusion, transfusion and therapeutic injection, initial encounter: Secondary | ICD-10-CM | POA: Diagnosis not present

## 2020-02-29 DIAGNOSIS — H353112 Nonexudative age-related macular degeneration, right eye, intermediate dry stage: Secondary | ICD-10-CM | POA: Diagnosis not present

## 2020-02-29 DIAGNOSIS — Z9049 Acquired absence of other specified parts of digestive tract: Secondary | ICD-10-CM | POA: Diagnosis not present

## 2020-02-29 DIAGNOSIS — J9610 Chronic respiratory failure, unspecified whether with hypoxia or hypercapnia: Secondary | ICD-10-CM | POA: Diagnosis not present

## 2020-02-29 DIAGNOSIS — Z66 Do not resuscitate: Secondary | ICD-10-CM | POA: Diagnosis not present

## 2020-02-29 DIAGNOSIS — L981 Factitial dermatitis: Secondary | ICD-10-CM | POA: Diagnosis not present

## 2020-02-29 DIAGNOSIS — N186 End stage renal disease: Secondary | ICD-10-CM | POA: Diagnosis not present

## 2020-02-29 DIAGNOSIS — M19071 Primary osteoarthritis, right ankle and foot: Secondary | ICD-10-CM | POA: Diagnosis not present

## 2020-02-29 DIAGNOSIS — H25013 Cortical age-related cataract, bilateral: Secondary | ICD-10-CM | POA: Diagnosis not present

## 2020-02-29 DIAGNOSIS — L814 Other melanin hyperpigmentation: Secondary | ICD-10-CM | POA: Diagnosis not present

## 2020-02-29 DIAGNOSIS — H1132 Conjunctival hemorrhage, left eye: Secondary | ICD-10-CM | POA: Diagnosis not present

## 2020-02-29 DIAGNOSIS — I25810 Atherosclerosis of coronary artery bypass graft(s) without angina pectoris: Secondary | ICD-10-CM | POA: Diagnosis not present

## 2020-02-29 DIAGNOSIS — K7031 Alcoholic cirrhosis of liver with ascites: Secondary | ICD-10-CM | POA: Diagnosis not present

## 2020-02-29 DIAGNOSIS — S32020G Wedge compression fracture of second lumbar vertebra, subsequent encounter for fracture with delayed healing: Secondary | ICD-10-CM | POA: Diagnosis not present

## 2020-02-29 DIAGNOSIS — I358 Other nonrheumatic aortic valve disorders: Secondary | ICD-10-CM | POA: Diagnosis not present

## 2020-02-29 DIAGNOSIS — L438 Other lichen planus: Secondary | ICD-10-CM | POA: Diagnosis not present

## 2020-02-29 DIAGNOSIS — T148XXD Other injury of unspecified body region, subsequent encounter: Secondary | ICD-10-CM | POA: Diagnosis not present

## 2020-02-29 DIAGNOSIS — Z954 Presence of other heart-valve replacement: Secondary | ICD-10-CM | POA: Diagnosis not present

## 2020-02-29 DIAGNOSIS — H9209 Otalgia, unspecified ear: Secondary | ICD-10-CM | POA: Diagnosis not present

## 2020-02-29 DIAGNOSIS — M19041 Primary osteoarthritis, right hand: Secondary | ICD-10-CM | POA: Diagnosis not present

## 2020-02-29 DIAGNOSIS — G8929 Other chronic pain: Secondary | ICD-10-CM | POA: Diagnosis not present

## 2020-02-29 DIAGNOSIS — R1084 Generalized abdominal pain: Secondary | ICD-10-CM | POA: Diagnosis not present

## 2020-02-29 DIAGNOSIS — N281 Cyst of kidney, acquired: Secondary | ICD-10-CM | POA: Diagnosis not present

## 2020-02-29 DIAGNOSIS — C099 Malignant neoplasm of tonsil, unspecified: Secondary | ICD-10-CM | POA: Diagnosis not present

## 2020-02-29 DIAGNOSIS — I82492 Acute embolism and thrombosis of other specified deep vein of left lower extremity: Secondary | ICD-10-CM | POA: Diagnosis not present

## 2020-02-29 DIAGNOSIS — L0390 Cellulitis, unspecified: Secondary | ICD-10-CM | POA: Diagnosis not present

## 2020-02-29 DIAGNOSIS — L565 Disseminated superficial actinic porokeratosis (DSAP): Secondary | ICD-10-CM | POA: Diagnosis not present

## 2020-02-29 DIAGNOSIS — Z510 Encounter for antineoplastic radiation therapy: Secondary | ICD-10-CM | POA: Diagnosis not present

## 2020-02-29 DIAGNOSIS — Z923 Personal history of irradiation: Secondary | ICD-10-CM | POA: Diagnosis not present

## 2020-02-29 DIAGNOSIS — F4321 Adjustment disorder with depressed mood: Secondary | ICD-10-CM | POA: Diagnosis not present

## 2020-02-29 DIAGNOSIS — L851 Acquired keratosis [keratoderma] palmaris et plantaris: Secondary | ICD-10-CM | POA: Diagnosis not present

## 2020-02-29 DIAGNOSIS — M2569 Stiffness of other specified joint, not elsewhere classified: Secondary | ICD-10-CM | POA: Diagnosis not present

## 2020-02-29 DIAGNOSIS — M5489 Other dorsalgia: Secondary | ICD-10-CM | POA: Diagnosis not present

## 2020-02-29 DIAGNOSIS — H401123 Primary open-angle glaucoma, left eye, severe stage: Secondary | ICD-10-CM | POA: Diagnosis not present

## 2020-02-29 DIAGNOSIS — Z1283 Encounter for screening for malignant neoplasm of skin: Secondary | ICD-10-CM | POA: Diagnosis not present

## 2020-02-29 DIAGNOSIS — C679 Malignant neoplasm of bladder, unspecified: Secondary | ICD-10-CM | POA: Diagnosis not present

## 2020-02-29 DIAGNOSIS — L299 Pruritus, unspecified: Secondary | ICD-10-CM | POA: Diagnosis not present

## 2020-02-29 DIAGNOSIS — H353212 Exudative age-related macular degeneration, right eye, with inactive choroidal neovascularization: Secondary | ICD-10-CM | POA: Diagnosis not present

## 2020-02-29 DIAGNOSIS — S0990XA Unspecified injury of head, initial encounter: Secondary | ICD-10-CM | POA: Diagnosis not present

## 2020-02-29 DIAGNOSIS — R6882 Decreased libido: Secondary | ICD-10-CM | POA: Diagnosis not present

## 2020-02-29 DIAGNOSIS — H5211 Myopia, right eye: Secondary | ICD-10-CM | POA: Diagnosis not present

## 2020-02-29 DIAGNOSIS — S62326A Displaced fracture of shaft of fifth metacarpal bone, right hand, initial encounter for closed fracture: Secondary | ICD-10-CM | POA: Diagnosis not present

## 2020-02-29 DIAGNOSIS — D511 Vitamin B12 deficiency anemia due to selective vitamin B12 malabsorption with proteinuria: Secondary | ICD-10-CM | POA: Diagnosis not present

## 2020-02-29 DIAGNOSIS — I82622 Acute embolism and thrombosis of deep veins of left upper extremity: Secondary | ICD-10-CM | POA: Diagnosis not present

## 2020-02-29 DIAGNOSIS — I110 Hypertensive heart disease with heart failure: Secondary | ICD-10-CM | POA: Diagnosis not present

## 2020-02-29 DIAGNOSIS — M1991 Primary osteoarthritis, unspecified site: Secondary | ICD-10-CM | POA: Diagnosis not present

## 2020-02-29 DIAGNOSIS — I87311 Chronic venous hypertension (idiopathic) with ulcer of right lower extremity: Secondary | ICD-10-CM | POA: Diagnosis not present

## 2020-02-29 DIAGNOSIS — M940 Chondrocostal junction syndrome [Tietze]: Secondary | ICD-10-CM | POA: Diagnosis not present

## 2020-02-29 DIAGNOSIS — K640 First degree hemorrhoids: Secondary | ICD-10-CM | POA: Diagnosis not present

## 2020-02-29 DIAGNOSIS — R269 Unspecified abnormalities of gait and mobility: Secondary | ICD-10-CM | POA: Diagnosis not present

## 2020-02-29 DIAGNOSIS — S83231D Complex tear of medial meniscus, current injury, right knee, subsequent encounter: Secondary | ICD-10-CM | POA: Diagnosis not present

## 2020-02-29 DIAGNOSIS — S93401A Sprain of unspecified ligament of right ankle, initial encounter: Secondary | ICD-10-CM | POA: Diagnosis not present

## 2020-02-29 DIAGNOSIS — Z0000 Encounter for general adult medical examination without abnormal findings: Secondary | ICD-10-CM | POA: Diagnosis not present

## 2020-02-29 DIAGNOSIS — R5382 Chronic fatigue, unspecified: Secondary | ICD-10-CM | POA: Diagnosis not present

## 2020-02-29 DIAGNOSIS — L219 Seborrheic dermatitis, unspecified: Secondary | ICD-10-CM | POA: Diagnosis not present

## 2020-02-29 DIAGNOSIS — B351 Tinea unguium: Secondary | ICD-10-CM | POA: Diagnosis not present

## 2020-02-29 DIAGNOSIS — Z1211 Encounter for screening for malignant neoplasm of colon: Secondary | ICD-10-CM | POA: Diagnosis not present

## 2020-02-29 DIAGNOSIS — R1314 Dysphagia, pharyngoesophageal phase: Secondary | ICD-10-CM | POA: Diagnosis not present

## 2020-02-29 DIAGNOSIS — C50812 Malignant neoplasm of overlapping sites of left female breast: Secondary | ICD-10-CM | POA: Diagnosis not present

## 2020-02-29 DIAGNOSIS — F900 Attention-deficit hyperactivity disorder, predominantly inattentive type: Secondary | ICD-10-CM | POA: Diagnosis not present

## 2020-02-29 DIAGNOSIS — K580 Irritable bowel syndrome with diarrhea: Secondary | ICD-10-CM | POA: Diagnosis not present

## 2020-02-29 DIAGNOSIS — I208 Other forms of angina pectoris: Secondary | ICD-10-CM | POA: Diagnosis not present

## 2020-02-29 DIAGNOSIS — L03114 Cellulitis of left upper limb: Secondary | ICD-10-CM | POA: Diagnosis not present

## 2020-02-29 DIAGNOSIS — I480 Paroxysmal atrial fibrillation: Secondary | ICD-10-CM | POA: Diagnosis not present

## 2020-02-29 DIAGNOSIS — M8458XA Pathological fracture in neoplastic disease, other specified site, initial encounter for fracture: Secondary | ICD-10-CM | POA: Diagnosis not present

## 2020-02-29 DIAGNOSIS — L0202 Furuncle of face: Secondary | ICD-10-CM | POA: Diagnosis not present

## 2020-02-29 DIAGNOSIS — I69351 Hemiplegia and hemiparesis following cerebral infarction affecting right dominant side: Secondary | ICD-10-CM | POA: Diagnosis not present

## 2020-02-29 DIAGNOSIS — J4540 Moderate persistent asthma, uncomplicated: Secondary | ICD-10-CM | POA: Diagnosis not present

## 2020-02-29 DIAGNOSIS — M25532 Pain in left wrist: Secondary | ICD-10-CM | POA: Diagnosis not present

## 2020-02-29 DIAGNOSIS — M1A00X0 Idiopathic chronic gout, unspecified site, without tophus (tophi): Secondary | ICD-10-CM | POA: Diagnosis not present

## 2020-02-29 DIAGNOSIS — Z13820 Encounter for screening for osteoporosis: Secondary | ICD-10-CM | POA: Diagnosis not present

## 2020-02-29 DIAGNOSIS — H35441 Age-related reticular degeneration of retina, right eye: Secondary | ICD-10-CM | POA: Diagnosis not present

## 2020-02-29 DIAGNOSIS — I5022 Chronic systolic (congestive) heart failure: Secondary | ICD-10-CM | POA: Diagnosis not present

## 2020-02-29 DIAGNOSIS — R683 Clubbing of fingers: Secondary | ICD-10-CM | POA: Diagnosis not present

## 2020-02-29 DIAGNOSIS — H401332 Pigmentary glaucoma, bilateral, moderate stage: Secondary | ICD-10-CM | POA: Diagnosis not present

## 2020-02-29 DIAGNOSIS — K7469 Other cirrhosis of liver: Secondary | ICD-10-CM | POA: Diagnosis not present

## 2020-02-29 DIAGNOSIS — K449 Diaphragmatic hernia without obstruction or gangrene: Secondary | ICD-10-CM | POA: Diagnosis not present

## 2020-02-29 DIAGNOSIS — Z8522 Personal history of malignant neoplasm of nasal cavities, middle ear, and accessory sinuses: Secondary | ICD-10-CM | POA: Diagnosis not present

## 2020-02-29 DIAGNOSIS — M79662 Pain in left lower leg: Secondary | ICD-10-CM | POA: Diagnosis not present

## 2020-02-29 DIAGNOSIS — K5720 Diverticulitis of large intestine with perforation and abscess without bleeding: Secondary | ICD-10-CM | POA: Diagnosis not present

## 2020-02-29 DIAGNOSIS — Z9189 Other specified personal risk factors, not elsewhere classified: Secondary | ICD-10-CM | POA: Diagnosis not present

## 2020-02-29 DIAGNOSIS — S51802A Unspecified open wound of left forearm, initial encounter: Secondary | ICD-10-CM | POA: Diagnosis not present

## 2020-02-29 DIAGNOSIS — H04319 Phlegmonous dacryocystitis of unspecified lacrimal passage: Secondary | ICD-10-CM | POA: Diagnosis not present

## 2020-02-29 DIAGNOSIS — G4709 Other insomnia: Secondary | ICD-10-CM | POA: Diagnosis not present

## 2020-02-29 DIAGNOSIS — M50222 Other cervical disc displacement at C5-C6 level: Secondary | ICD-10-CM | POA: Diagnosis not present

## 2020-02-29 DIAGNOSIS — M9902 Segmental and somatic dysfunction of thoracic region: Secondary | ICD-10-CM | POA: Diagnosis not present

## 2020-02-29 DIAGNOSIS — L538 Other specified erythematous conditions: Secondary | ICD-10-CM | POA: Diagnosis not present

## 2020-02-29 DIAGNOSIS — H25813 Combined forms of age-related cataract, bilateral: Secondary | ICD-10-CM | POA: Diagnosis not present

## 2020-02-29 DIAGNOSIS — Z136 Encounter for screening for cardiovascular disorders: Secondary | ICD-10-CM | POA: Diagnosis not present

## 2020-02-29 DIAGNOSIS — H04121 Dry eye syndrome of right lacrimal gland: Secondary | ICD-10-CM | POA: Diagnosis not present

## 2020-02-29 DIAGNOSIS — S8992XA Unspecified injury of left lower leg, initial encounter: Secondary | ICD-10-CM | POA: Diagnosis not present

## 2020-02-29 DIAGNOSIS — L820 Inflamed seborrheic keratosis: Secondary | ICD-10-CM | POA: Diagnosis not present

## 2020-02-29 DIAGNOSIS — D124 Benign neoplasm of descending colon: Secondary | ICD-10-CM | POA: Diagnosis not present

## 2020-02-29 DIAGNOSIS — Z905 Acquired absence of kidney: Secondary | ICD-10-CM | POA: Diagnosis not present

## 2020-02-29 DIAGNOSIS — M25631 Stiffness of right wrist, not elsewhere classified: Secondary | ICD-10-CM | POA: Diagnosis not present

## 2020-02-29 DIAGNOSIS — F4312 Post-traumatic stress disorder, chronic: Secondary | ICD-10-CM | POA: Diagnosis not present

## 2020-02-29 DIAGNOSIS — K9041 Non-celiac gluten sensitivity: Secondary | ICD-10-CM | POA: Diagnosis not present

## 2020-02-29 DIAGNOSIS — M2042 Other hammer toe(s) (acquired), left foot: Secondary | ICD-10-CM | POA: Diagnosis not present

## 2020-02-29 DIAGNOSIS — R102 Pelvic and perineal pain: Secondary | ICD-10-CM | POA: Diagnosis not present

## 2020-02-29 DIAGNOSIS — R208 Other disturbances of skin sensation: Secondary | ICD-10-CM | POA: Diagnosis not present

## 2020-02-29 DIAGNOSIS — C541 Malignant neoplasm of endometrium: Secondary | ICD-10-CM | POA: Diagnosis not present

## 2020-02-29 DIAGNOSIS — M19011 Primary osteoarthritis, right shoulder: Secondary | ICD-10-CM | POA: Diagnosis not present

## 2020-02-29 DIAGNOSIS — F419 Anxiety disorder, unspecified: Secondary | ICD-10-CM | POA: Diagnosis not present

## 2020-02-29 DIAGNOSIS — C3431 Malignant neoplasm of lower lobe, right bronchus or lung: Secondary | ICD-10-CM | POA: Diagnosis not present

## 2020-02-29 DIAGNOSIS — D2271 Melanocytic nevi of right lower limb, including hip: Secondary | ICD-10-CM | POA: Diagnosis not present

## 2020-02-29 DIAGNOSIS — N5201 Erectile dysfunction due to arterial insufficiency: Secondary | ICD-10-CM | POA: Diagnosis not present

## 2020-02-29 DIAGNOSIS — I871 Compression of vein: Secondary | ICD-10-CM | POA: Diagnosis not present

## 2020-02-29 DIAGNOSIS — G4731 Primary central sleep apnea: Secondary | ICD-10-CM | POA: Diagnosis not present

## 2020-02-29 DIAGNOSIS — R0609 Other forms of dyspnea: Secondary | ICD-10-CM | POA: Diagnosis not present

## 2020-02-29 DIAGNOSIS — X32XXXD Exposure to sunlight, subsequent encounter: Secondary | ICD-10-CM | POA: Diagnosis not present

## 2020-02-29 DIAGNOSIS — H532 Diplopia: Secondary | ICD-10-CM | POA: Diagnosis not present

## 2020-02-29 DIAGNOSIS — Z9889 Other specified postprocedural states: Secondary | ICD-10-CM | POA: Diagnosis not present

## 2020-02-29 DIAGNOSIS — G43711 Chronic migraine without aura, intractable, with status migrainosus: Secondary | ICD-10-CM | POA: Diagnosis not present

## 2020-02-29 DIAGNOSIS — M79676 Pain in unspecified toe(s): Secondary | ICD-10-CM | POA: Diagnosis not present

## 2020-02-29 DIAGNOSIS — M62551 Muscle wasting and atrophy, not elsewhere classified, right thigh: Secondary | ICD-10-CM | POA: Diagnosis not present

## 2020-02-29 DIAGNOSIS — C61 Malignant neoplasm of prostate: Secondary | ICD-10-CM | POA: Diagnosis not present

## 2020-02-29 DIAGNOSIS — E0500 Thyrotoxicosis with diffuse goiter without thyrotoxic crisis or storm: Secondary | ICD-10-CM | POA: Diagnosis not present

## 2020-02-29 DIAGNOSIS — I8311 Varicose veins of right lower extremity with inflammation: Secondary | ICD-10-CM | POA: Diagnosis not present

## 2020-02-29 DIAGNOSIS — L900 Lichen sclerosus et atrophicus: Secondary | ICD-10-CM | POA: Diagnosis not present

## 2020-02-29 DIAGNOSIS — Z981 Arthrodesis status: Secondary | ICD-10-CM | POA: Diagnosis not present

## 2020-02-29 DIAGNOSIS — G9389 Other specified disorders of brain: Secondary | ICD-10-CM | POA: Diagnosis not present

## 2020-02-29 DIAGNOSIS — R635 Abnormal weight gain: Secondary | ICD-10-CM | POA: Diagnosis not present

## 2020-02-29 DIAGNOSIS — Z01810 Encounter for preprocedural cardiovascular examination: Secondary | ICD-10-CM | POA: Diagnosis not present

## 2020-02-29 DIAGNOSIS — M5020 Other cervical disc displacement, unspecified cervical region: Secondary | ICD-10-CM | POA: Diagnosis not present

## 2020-02-29 DIAGNOSIS — T86819 Unspecified complication of lung transplant: Secondary | ICD-10-CM | POA: Diagnosis not present

## 2020-02-29 DIAGNOSIS — M546 Pain in thoracic spine: Secondary | ICD-10-CM | POA: Diagnosis not present

## 2020-02-29 DIAGNOSIS — C569 Malignant neoplasm of unspecified ovary: Secondary | ICD-10-CM | POA: Diagnosis not present

## 2020-02-29 DIAGNOSIS — L603 Nail dystrophy: Secondary | ICD-10-CM | POA: Diagnosis not present

## 2020-02-29 DIAGNOSIS — F0631 Mood disorder due to known physiological condition with depressive features: Secondary | ICD-10-CM | POA: Diagnosis not present

## 2020-02-29 DIAGNOSIS — G5601 Carpal tunnel syndrome, right upper limb: Secondary | ICD-10-CM | POA: Diagnosis not present

## 2020-02-29 DIAGNOSIS — Z0001 Encounter for general adult medical examination with abnormal findings: Secondary | ICD-10-CM | POA: Diagnosis not present

## 2020-02-29 DIAGNOSIS — I2584 Coronary atherosclerosis due to calcified coronary lesion: Secondary | ICD-10-CM | POA: Diagnosis not present

## 2020-02-29 DIAGNOSIS — G20 Parkinson's disease: Secondary | ICD-10-CM | POA: Diagnosis not present

## 2020-02-29 DIAGNOSIS — F5001 Anorexia nervosa, restricting type: Secondary | ICD-10-CM | POA: Diagnosis not present

## 2020-02-29 DIAGNOSIS — M8500 Fibrous dysplasia (monostotic), unspecified site: Secondary | ICD-10-CM | POA: Diagnosis not present

## 2020-02-29 DIAGNOSIS — H40023 Open angle with borderline findings, high risk, bilateral: Secondary | ICD-10-CM | POA: Diagnosis not present

## 2020-02-29 DIAGNOSIS — M7632 Iliotibial band syndrome, left leg: Secondary | ICD-10-CM | POA: Diagnosis not present

## 2020-02-29 DIAGNOSIS — E0580 Other thyrotoxicosis without thyrotoxic crisis or storm: Secondary | ICD-10-CM | POA: Diagnosis not present

## 2020-02-29 DIAGNOSIS — R0789 Other chest pain: Secondary | ICD-10-CM | POA: Diagnosis not present

## 2020-02-29 DIAGNOSIS — M349 Systemic sclerosis, unspecified: Secondary | ICD-10-CM | POA: Diagnosis not present

## 2020-02-29 DIAGNOSIS — S79912A Unspecified injury of left hip, initial encounter: Secondary | ICD-10-CM | POA: Diagnosis not present

## 2020-02-29 DIAGNOSIS — M5416 Radiculopathy, lumbar region: Secondary | ICD-10-CM | POA: Diagnosis not present

## 2020-02-29 DIAGNOSIS — H43813 Vitreous degeneration, bilateral: Secondary | ICD-10-CM | POA: Diagnosis not present

## 2020-02-29 DIAGNOSIS — J189 Pneumonia, unspecified organism: Secondary | ICD-10-CM | POA: Diagnosis not present

## 2020-02-29 DIAGNOSIS — I25811 Atherosclerosis of native coronary artery of transplanted heart without angina pectoris: Secondary | ICD-10-CM | POA: Diagnosis not present

## 2020-02-29 DIAGNOSIS — R1032 Left lower quadrant pain: Secondary | ICD-10-CM | POA: Diagnosis not present

## 2020-02-29 DIAGNOSIS — M79672 Pain in left foot: Secondary | ICD-10-CM | POA: Diagnosis not present

## 2020-02-29 DIAGNOSIS — K7460 Unspecified cirrhosis of liver: Secondary | ICD-10-CM | POA: Diagnosis not present

## 2020-02-29 DIAGNOSIS — M1612 Unilateral primary osteoarthritis, left hip: Secondary | ICD-10-CM | POA: Diagnosis not present

## 2020-02-29 DIAGNOSIS — R2689 Other abnormalities of gait and mobility: Secondary | ICD-10-CM | POA: Diagnosis not present

## 2020-02-29 DIAGNOSIS — R040 Epistaxis: Secondary | ICD-10-CM | POA: Diagnosis not present

## 2020-02-29 DIAGNOSIS — M353 Polymyalgia rheumatica: Secondary | ICD-10-CM | POA: Diagnosis not present

## 2020-02-29 DIAGNOSIS — H02834 Dermatochalasis of left upper eyelid: Secondary | ICD-10-CM | POA: Diagnosis not present

## 2020-02-29 DIAGNOSIS — H401111 Primary open-angle glaucoma, right eye, mild stage: Secondary | ICD-10-CM | POA: Diagnosis not present

## 2020-02-29 DIAGNOSIS — L84 Corns and callosities: Secondary | ICD-10-CM | POA: Diagnosis not present

## 2020-02-29 DIAGNOSIS — K7581 Nonalcoholic steatohepatitis (NASH): Secondary | ICD-10-CM | POA: Diagnosis not present

## 2020-02-29 DIAGNOSIS — J810 Acute pulmonary edema: Secondary | ICD-10-CM | POA: Diagnosis not present

## 2020-02-29 DIAGNOSIS — I700 Atherosclerosis of aorta: Secondary | ICD-10-CM | POA: Diagnosis not present

## 2020-02-29 DIAGNOSIS — Z885 Allergy status to narcotic agent status: Secondary | ICD-10-CM | POA: Diagnosis not present

## 2020-02-29 DIAGNOSIS — E1151 Type 2 diabetes mellitus with diabetic peripheral angiopathy without gangrene: Secondary | ICD-10-CM | POA: Diagnosis not present

## 2020-02-29 DIAGNOSIS — H0100B Unspecified blepharitis left eye, upper and lower eyelids: Secondary | ICD-10-CM | POA: Diagnosis not present

## 2020-02-29 DIAGNOSIS — R238 Other skin changes: Secondary | ICD-10-CM | POA: Diagnosis not present

## 2020-02-29 DIAGNOSIS — I6349 Cerebral infarction due to embolism of other cerebral artery: Secondary | ICD-10-CM | POA: Diagnosis not present

## 2020-02-29 DIAGNOSIS — E7800 Pure hypercholesterolemia, unspecified: Secondary | ICD-10-CM | POA: Diagnosis not present

## 2020-02-29 DIAGNOSIS — L718 Other rosacea: Secondary | ICD-10-CM | POA: Diagnosis not present

## 2020-02-29 DIAGNOSIS — S43431A Superior glenoid labrum lesion of right shoulder, initial encounter: Secondary | ICD-10-CM | POA: Diagnosis not present

## 2020-02-29 DIAGNOSIS — R05 Cough: Secondary | ICD-10-CM | POA: Diagnosis not present

## 2020-02-29 DIAGNOSIS — R8290 Unspecified abnormal findings in urine: Secondary | ICD-10-CM | POA: Diagnosis not present

## 2020-02-29 DIAGNOSIS — M1811 Unilateral primary osteoarthritis of first carpometacarpal joint, right hand: Secondary | ICD-10-CM | POA: Diagnosis not present

## 2020-02-29 DIAGNOSIS — Z8719 Personal history of other diseases of the digestive system: Secondary | ICD-10-CM | POA: Diagnosis not present

## 2020-02-29 DIAGNOSIS — H6533 Chronic mucoid otitis media, bilateral: Secondary | ICD-10-CM | POA: Diagnosis not present

## 2020-02-29 DIAGNOSIS — M7541 Impingement syndrome of right shoulder: Secondary | ICD-10-CM | POA: Diagnosis not present

## 2020-02-29 DIAGNOSIS — H9190 Unspecified hearing loss, unspecified ear: Secondary | ICD-10-CM | POA: Diagnosis not present

## 2020-02-29 DIAGNOSIS — M8448XD Pathological fracture, other site, subsequent encounter for fracture with routine healing: Secondary | ICD-10-CM | POA: Diagnosis not present

## 2020-02-29 DIAGNOSIS — C9201 Acute myeloblastic leukemia, in remission: Secondary | ICD-10-CM | POA: Diagnosis not present

## 2020-02-29 DIAGNOSIS — R8271 Bacteriuria: Secondary | ICD-10-CM | POA: Diagnosis not present

## 2020-02-29 DIAGNOSIS — F22 Delusional disorders: Secondary | ICD-10-CM | POA: Diagnosis not present

## 2020-02-29 DIAGNOSIS — M461 Sacroiliitis, not elsewhere classified: Secondary | ICD-10-CM | POA: Diagnosis not present

## 2020-02-29 DIAGNOSIS — I872 Venous insufficiency (chronic) (peripheral): Secondary | ICD-10-CM | POA: Diagnosis not present

## 2020-02-29 DIAGNOSIS — Z130 Encounter for screening for diseases of the blood and blood-forming organs and certain disorders involving the immune mechanism: Secondary | ICD-10-CM | POA: Diagnosis not present

## 2020-02-29 DIAGNOSIS — M47816 Spondylosis without myelopathy or radiculopathy, lumbar region: Secondary | ICD-10-CM | POA: Diagnosis not present

## 2020-02-29 DIAGNOSIS — F413 Other mixed anxiety disorders: Secondary | ICD-10-CM | POA: Diagnosis not present

## 2020-02-29 DIAGNOSIS — T22299D Burn of second degree of multiple sites of unspecified shoulder and upper limb, except wrist and hand, subsequent encounter: Secondary | ICD-10-CM | POA: Diagnosis not present

## 2020-02-29 DIAGNOSIS — H11823 Conjunctivochalasis, bilateral: Secondary | ICD-10-CM | POA: Diagnosis not present

## 2020-02-29 DIAGNOSIS — Z7189 Other specified counseling: Secondary | ICD-10-CM | POA: Diagnosis not present

## 2020-02-29 DIAGNOSIS — D510 Vitamin B12 deficiency anemia due to intrinsic factor deficiency: Secondary | ICD-10-CM | POA: Diagnosis not present

## 2020-02-29 DIAGNOSIS — R400 Somnolence: Secondary | ICD-10-CM | POA: Diagnosis not present

## 2020-02-29 DIAGNOSIS — I69391 Dysphagia following cerebral infarction: Secondary | ICD-10-CM | POA: Diagnosis not present

## 2020-02-29 DIAGNOSIS — R339 Retention of urine, unspecified: Secondary | ICD-10-CM | POA: Diagnosis not present

## 2020-02-29 DIAGNOSIS — I429 Cardiomyopathy, unspecified: Secondary | ICD-10-CM | POA: Diagnosis not present

## 2020-02-29 DIAGNOSIS — H43812 Vitreous degeneration, left eye: Secondary | ICD-10-CM | POA: Diagnosis not present

## 2020-02-29 DIAGNOSIS — R011 Cardiac murmur, unspecified: Secondary | ICD-10-CM | POA: Diagnosis not present

## 2020-02-29 DIAGNOSIS — F410 Panic disorder [episodic paroxysmal anxiety] without agoraphobia: Secondary | ICD-10-CM | POA: Diagnosis not present

## 2020-02-29 DIAGNOSIS — M25662 Stiffness of left knee, not elsewhere classified: Secondary | ICD-10-CM | POA: Diagnosis not present

## 2020-02-29 DIAGNOSIS — F3161 Bipolar disorder, current episode mixed, mild: Secondary | ICD-10-CM | POA: Diagnosis not present

## 2020-02-29 DIAGNOSIS — H35033 Hypertensive retinopathy, bilateral: Secondary | ICD-10-CM | POA: Diagnosis not present

## 2020-02-29 DIAGNOSIS — I6529 Occlusion and stenosis of unspecified carotid artery: Secondary | ICD-10-CM | POA: Diagnosis not present

## 2020-02-29 DIAGNOSIS — I119 Hypertensive heart disease without heart failure: Secondary | ICD-10-CM | POA: Diagnosis not present

## 2020-02-29 DIAGNOSIS — J4520 Mild intermittent asthma, uncomplicated: Secondary | ICD-10-CM | POA: Diagnosis not present

## 2020-02-29 DIAGNOSIS — C8599 Non-Hodgkin lymphoma, unspecified, extranodal and solid organ sites: Secondary | ICD-10-CM | POA: Diagnosis not present

## 2020-02-29 DIAGNOSIS — L97812 Non-pressure chronic ulcer of other part of right lower leg with fat layer exposed: Secondary | ICD-10-CM | POA: Diagnosis not present

## 2020-02-29 DIAGNOSIS — M5388 Other specified dorsopathies, sacral and sacrococcygeal region: Secondary | ICD-10-CM | POA: Diagnosis not present

## 2020-02-29 DIAGNOSIS — H9192 Unspecified hearing loss, left ear: Secondary | ICD-10-CM | POA: Diagnosis not present

## 2020-02-29 DIAGNOSIS — A419 Sepsis, unspecified organism: Secondary | ICD-10-CM | POA: Diagnosis not present

## 2020-02-29 DIAGNOSIS — K222 Esophageal obstruction: Secondary | ICD-10-CM | POA: Diagnosis not present

## 2020-02-29 DIAGNOSIS — M50323 Other cervical disc degeneration at C6-C7 level: Secondary | ICD-10-CM | POA: Diagnosis not present

## 2020-02-29 DIAGNOSIS — L570 Actinic keratosis: Secondary | ICD-10-CM | POA: Diagnosis not present

## 2020-02-29 DIAGNOSIS — M40292 Other kyphosis, cervical region: Secondary | ICD-10-CM | POA: Diagnosis not present

## 2020-02-29 DIAGNOSIS — R519 Headache, unspecified: Secondary | ICD-10-CM | POA: Diagnosis not present

## 2020-02-29 DIAGNOSIS — R202 Paresthesia of skin: Secondary | ICD-10-CM | POA: Diagnosis not present

## 2020-02-29 DIAGNOSIS — F331 Major depressive disorder, recurrent, moderate: Secondary | ICD-10-CM | POA: Diagnosis not present

## 2020-02-29 DIAGNOSIS — S52572B Other intraarticular fracture of lower end of left radius, initial encounter for open fracture type I or II: Secondary | ICD-10-CM | POA: Diagnosis not present

## 2020-02-29 DIAGNOSIS — L281 Prurigo nodularis: Secondary | ICD-10-CM | POA: Diagnosis not present

## 2020-02-29 DIAGNOSIS — C44319 Basal cell carcinoma of skin of other parts of face: Secondary | ICD-10-CM | POA: Diagnosis not present

## 2020-02-29 DIAGNOSIS — M205X2 Other deformities of toe(s) (acquired), left foot: Secondary | ICD-10-CM | POA: Diagnosis not present

## 2020-02-29 DIAGNOSIS — S4991XD Unspecified injury of right shoulder and upper arm, subsequent encounter: Secondary | ICD-10-CM | POA: Diagnosis not present

## 2020-02-29 DIAGNOSIS — G894 Chronic pain syndrome: Secondary | ICD-10-CM | POA: Diagnosis not present

## 2020-02-29 DIAGNOSIS — E063 Autoimmune thyroiditis: Secondary | ICD-10-CM | POA: Diagnosis not present

## 2020-02-29 DIAGNOSIS — I5032 Chronic diastolic (congestive) heart failure: Secondary | ICD-10-CM | POA: Diagnosis not present

## 2020-02-29 DIAGNOSIS — S336XXA Sprain of sacroiliac joint, initial encounter: Secondary | ICD-10-CM | POA: Diagnosis not present

## 2020-02-29 DIAGNOSIS — C8334 Diffuse large B-cell lymphoma, lymph nodes of axilla and upper limb: Secondary | ICD-10-CM | POA: Diagnosis not present

## 2020-02-29 DIAGNOSIS — Z719 Counseling, unspecified: Secondary | ICD-10-CM | POA: Diagnosis not present

## 2020-02-29 DIAGNOSIS — R262 Difficulty in walking, not elsewhere classified: Secondary | ICD-10-CM | POA: Diagnosis not present

## 2020-02-29 DIAGNOSIS — R279 Unspecified lack of coordination: Secondary | ICD-10-CM | POA: Diagnosis not present

## 2020-02-29 DIAGNOSIS — R4189 Other symptoms and signs involving cognitive functions and awareness: Secondary | ICD-10-CM | POA: Diagnosis not present

## 2020-02-29 DIAGNOSIS — M25529 Pain in unspecified elbow: Secondary | ICD-10-CM | POA: Diagnosis not present

## 2020-02-29 DIAGNOSIS — H906 Mixed conductive and sensorineural hearing loss, bilateral: Secondary | ICD-10-CM | POA: Diagnosis not present

## 2020-02-29 DIAGNOSIS — M25512 Pain in left shoulder: Secondary | ICD-10-CM | POA: Diagnosis not present

## 2020-02-29 DIAGNOSIS — M2020 Hallux rigidus, unspecified foot: Secondary | ICD-10-CM | POA: Diagnosis not present

## 2020-02-29 DIAGNOSIS — H5713 Ocular pain, bilateral: Secondary | ICD-10-CM | POA: Diagnosis not present

## 2020-02-29 DIAGNOSIS — Z7902 Long term (current) use of antithrombotics/antiplatelets: Secondary | ICD-10-CM | POA: Diagnosis not present

## 2020-02-29 DIAGNOSIS — H8109 Meniere's disease, unspecified ear: Secondary | ICD-10-CM | POA: Diagnosis not present

## 2020-02-29 DIAGNOSIS — F0281 Dementia in other diseases classified elsewhere with behavioral disturbance: Secondary | ICD-10-CM | POA: Diagnosis not present

## 2020-02-29 DIAGNOSIS — M1909 Primary osteoarthritis, other specified site: Secondary | ICD-10-CM | POA: Diagnosis not present

## 2020-02-29 DIAGNOSIS — S143XXD Injury of brachial plexus, subsequent encounter: Secondary | ICD-10-CM | POA: Diagnosis not present

## 2020-02-29 DIAGNOSIS — A630 Anogenital (venereal) warts: Secondary | ICD-10-CM | POA: Diagnosis not present

## 2020-02-29 DIAGNOSIS — Z114 Encounter for screening for human immunodeficiency virus [HIV]: Secondary | ICD-10-CM | POA: Diagnosis not present

## 2020-02-29 DIAGNOSIS — Z4889 Encounter for other specified surgical aftercare: Secondary | ICD-10-CM | POA: Diagnosis not present

## 2020-02-29 DIAGNOSIS — M160 Bilateral primary osteoarthritis of hip: Secondary | ICD-10-CM | POA: Diagnosis not present

## 2020-02-29 DIAGNOSIS — Z1159 Encounter for screening for other viral diseases: Secondary | ICD-10-CM | POA: Diagnosis not present

## 2020-02-29 DIAGNOSIS — I672 Cerebral atherosclerosis: Secondary | ICD-10-CM | POA: Diagnosis not present

## 2020-02-29 DIAGNOSIS — M1990 Unspecified osteoarthritis, unspecified site: Secondary | ICD-10-CM | POA: Diagnosis not present

## 2020-02-29 DIAGNOSIS — Q743 Arthrogryposis multiplex congenita: Secondary | ICD-10-CM | POA: Diagnosis not present

## 2020-02-29 DIAGNOSIS — K529 Noninfective gastroenteritis and colitis, unspecified: Secondary | ICD-10-CM | POA: Diagnosis not present

## 2020-02-29 DIAGNOSIS — Z6823 Body mass index (BMI) 23.0-23.9, adult: Secondary | ICD-10-CM | POA: Diagnosis not present

## 2020-02-29 DIAGNOSIS — R160 Hepatomegaly, not elsewhere classified: Secondary | ICD-10-CM | POA: Diagnosis not present

## 2020-02-29 DIAGNOSIS — I25118 Atherosclerotic heart disease of native coronary artery with other forms of angina pectoris: Secondary | ICD-10-CM | POA: Diagnosis not present

## 2020-02-29 DIAGNOSIS — Z08 Encounter for follow-up examination after completed treatment for malignant neoplasm: Secondary | ICD-10-CM | POA: Diagnosis not present

## 2020-02-29 DIAGNOSIS — Z23 Encounter for immunization: Secondary | ICD-10-CM | POA: Diagnosis not present

## 2020-02-29 DIAGNOSIS — S0191XA Laceration without foreign body of unspecified part of head, initial encounter: Secondary | ICD-10-CM | POA: Diagnosis not present

## 2020-02-29 DIAGNOSIS — R293 Abnormal posture: Secondary | ICD-10-CM | POA: Diagnosis not present

## 2020-02-29 DIAGNOSIS — Z85118 Personal history of other malignant neoplasm of bronchus and lung: Secondary | ICD-10-CM | POA: Diagnosis not present

## 2020-02-29 DIAGNOSIS — K50818 Crohn's disease of both small and large intestine with other complication: Secondary | ICD-10-CM | POA: Diagnosis not present

## 2020-02-29 DIAGNOSIS — M79605 Pain in left leg: Secondary | ICD-10-CM | POA: Diagnosis not present

## 2020-02-29 DIAGNOSIS — R209 Unspecified disturbances of skin sensation: Secondary | ICD-10-CM | POA: Diagnosis not present

## 2020-02-29 DIAGNOSIS — Z4781 Encounter for orthopedic aftercare following surgical amputation: Secondary | ICD-10-CM | POA: Diagnosis not present

## 2020-02-29 DIAGNOSIS — J9690 Respiratory failure, unspecified, unspecified whether with hypoxia or hypercapnia: Secondary | ICD-10-CM | POA: Diagnosis not present

## 2020-02-29 DIAGNOSIS — I482 Chronic atrial fibrillation, unspecified: Secondary | ICD-10-CM | POA: Diagnosis not present

## 2020-02-29 DIAGNOSIS — H02142 Spastic ectropion of right lower eyelid: Secondary | ICD-10-CM | POA: Diagnosis not present

## 2020-02-29 DIAGNOSIS — H905 Unspecified sensorineural hearing loss: Secondary | ICD-10-CM | POA: Diagnosis not present

## 2020-02-29 DIAGNOSIS — R261 Paralytic gait: Secondary | ICD-10-CM | POA: Diagnosis not present

## 2020-02-29 DIAGNOSIS — K4090 Unilateral inguinal hernia, without obstruction or gangrene, not specified as recurrent: Secondary | ICD-10-CM | POA: Diagnosis not present

## 2020-02-29 DIAGNOSIS — Z9089 Acquired absence of other organs: Secondary | ICD-10-CM | POA: Diagnosis not present

## 2020-02-29 DIAGNOSIS — N4889 Other specified disorders of penis: Secondary | ICD-10-CM | POA: Diagnosis not present

## 2020-02-29 DIAGNOSIS — R935 Abnormal findings on diagnostic imaging of other abdominal regions, including retroperitoneum: Secondary | ICD-10-CM | POA: Diagnosis not present

## 2020-02-29 DIAGNOSIS — S79911A Unspecified injury of right hip, initial encounter: Secondary | ICD-10-CM | POA: Diagnosis not present

## 2020-02-29 DIAGNOSIS — W19XXXD Unspecified fall, subsequent encounter: Secondary | ICD-10-CM | POA: Diagnosis not present

## 2020-02-29 DIAGNOSIS — F5104 Psychophysiologic insomnia: Secondary | ICD-10-CM | POA: Diagnosis not present

## 2020-02-29 DIAGNOSIS — K648 Other hemorrhoids: Secondary | ICD-10-CM | POA: Diagnosis not present

## 2020-02-29 DIAGNOSIS — D539 Nutritional anemia, unspecified: Secondary | ICD-10-CM | POA: Diagnosis not present

## 2020-02-29 DIAGNOSIS — E1069 Type 1 diabetes mellitus with other specified complication: Secondary | ICD-10-CM | POA: Diagnosis not present

## 2020-02-29 DIAGNOSIS — R200 Anesthesia of skin: Secondary | ICD-10-CM | POA: Diagnosis not present

## 2020-02-29 DIAGNOSIS — D508 Other iron deficiency anemias: Secondary | ICD-10-CM | POA: Diagnosis not present

## 2020-02-29 DIAGNOSIS — S270XXA Traumatic pneumothorax, initial encounter: Secondary | ICD-10-CM | POA: Diagnosis not present

## 2020-02-29 DIAGNOSIS — E46 Unspecified protein-calorie malnutrition: Secondary | ICD-10-CM | POA: Diagnosis not present

## 2020-02-29 DIAGNOSIS — H1010 Acute atopic conjunctivitis, unspecified eye: Secondary | ICD-10-CM | POA: Diagnosis not present

## 2020-02-29 DIAGNOSIS — J3081 Allergic rhinitis due to animal (cat) (dog) hair and dander: Secondary | ICD-10-CM | POA: Diagnosis not present

## 2020-02-29 DIAGNOSIS — Z8349 Family history of other endocrine, nutritional and metabolic diseases: Secondary | ICD-10-CM | POA: Diagnosis not present

## 2020-02-29 DIAGNOSIS — M48062 Spinal stenosis, lumbar region with neurogenic claudication: Secondary | ICD-10-CM | POA: Diagnosis not present

## 2020-02-29 DIAGNOSIS — J432 Centrilobular emphysema: Secondary | ICD-10-CM | POA: Diagnosis not present

## 2020-02-29 DIAGNOSIS — M810 Age-related osteoporosis without current pathological fracture: Secondary | ICD-10-CM | POA: Diagnosis not present

## 2020-02-29 DIAGNOSIS — S52501D Unspecified fracture of the lower end of right radius, subsequent encounter for closed fracture with routine healing: Secondary | ICD-10-CM | POA: Diagnosis not present

## 2020-02-29 DIAGNOSIS — H903 Sensorineural hearing loss, bilateral: Secondary | ICD-10-CM | POA: Diagnosis not present

## 2020-02-29 DIAGNOSIS — E1065 Type 1 diabetes mellitus with hyperglycemia: Secondary | ICD-10-CM | POA: Diagnosis not present

## 2020-02-29 DIAGNOSIS — C44729 Squamous cell carcinoma of skin of left lower limb, including hip: Secondary | ICD-10-CM | POA: Diagnosis not present

## 2020-02-29 DIAGNOSIS — Z6839 Body mass index (BMI) 39.0-39.9, adult: Secondary | ICD-10-CM | POA: Diagnosis not present

## 2020-02-29 DIAGNOSIS — I6603 Occlusion and stenosis of bilateral middle cerebral arteries: Secondary | ICD-10-CM | POA: Diagnosis not present

## 2020-02-29 DIAGNOSIS — L218 Other seborrheic dermatitis: Secondary | ICD-10-CM | POA: Diagnosis not present

## 2020-02-29 DIAGNOSIS — K862 Cyst of pancreas: Secondary | ICD-10-CM | POA: Diagnosis not present

## 2020-02-29 DIAGNOSIS — J849 Interstitial pulmonary disease, unspecified: Secondary | ICD-10-CM | POA: Diagnosis not present

## 2020-02-29 DIAGNOSIS — Z8669 Personal history of other diseases of the nervous system and sense organs: Secondary | ICD-10-CM | POA: Diagnosis not present

## 2020-02-29 DIAGNOSIS — M62512 Muscle wasting and atrophy, not elsewhere classified, left shoulder: Secondary | ICD-10-CM | POA: Diagnosis not present

## 2020-02-29 DIAGNOSIS — K869 Disease of pancreas, unspecified: Secondary | ICD-10-CM | POA: Diagnosis not present

## 2020-02-29 DIAGNOSIS — E1122 Type 2 diabetes mellitus with diabetic chronic kidney disease: Secondary | ICD-10-CM | POA: Diagnosis not present

## 2020-02-29 DIAGNOSIS — C3492 Malignant neoplasm of unspecified part of left bronchus or lung: Secondary | ICD-10-CM | POA: Diagnosis not present

## 2020-02-29 DIAGNOSIS — R1030 Lower abdominal pain, unspecified: Secondary | ICD-10-CM | POA: Diagnosis not present

## 2020-02-29 DIAGNOSIS — Z792 Long term (current) use of antibiotics: Secondary | ICD-10-CM | POA: Diagnosis not present

## 2020-02-29 DIAGNOSIS — R062 Wheezing: Secondary | ICD-10-CM | POA: Diagnosis not present

## 2020-02-29 DIAGNOSIS — H35372 Puckering of macula, left eye: Secondary | ICD-10-CM | POA: Diagnosis not present

## 2020-02-29 DIAGNOSIS — G301 Alzheimer's disease with late onset: Secondary | ICD-10-CM | POA: Diagnosis not present

## 2020-02-29 DIAGNOSIS — C189 Malignant neoplasm of colon, unspecified: Secondary | ICD-10-CM | POA: Diagnosis not present

## 2020-02-29 DIAGNOSIS — F0150 Vascular dementia without behavioral disturbance: Secondary | ICD-10-CM | POA: Diagnosis not present

## 2020-02-29 DIAGNOSIS — Z171 Estrogen receptor negative status [ER-]: Secondary | ICD-10-CM | POA: Diagnosis not present

## 2020-02-29 DIAGNOSIS — J309 Allergic rhinitis, unspecified: Secondary | ICD-10-CM | POA: Diagnosis not present

## 2020-02-29 DIAGNOSIS — F1290 Cannabis use, unspecified, uncomplicated: Secondary | ICD-10-CM | POA: Diagnosis not present

## 2020-02-29 DIAGNOSIS — Z45018 Encounter for adjustment and management of other part of cardiac pacemaker: Secondary | ICD-10-CM | POA: Diagnosis not present

## 2020-02-29 DIAGNOSIS — I998 Other disorder of circulatory system: Secondary | ICD-10-CM | POA: Diagnosis not present

## 2020-02-29 DIAGNOSIS — M79661 Pain in right lower leg: Secondary | ICD-10-CM | POA: Diagnosis not present

## 2020-02-29 DIAGNOSIS — N1831 Chronic kidney disease, stage 3a: Secondary | ICD-10-CM | POA: Diagnosis not present

## 2020-02-29 DIAGNOSIS — R82998 Other abnormal findings in urine: Secondary | ICD-10-CM | POA: Diagnosis not present

## 2020-02-29 DIAGNOSIS — S42213A Unspecified displaced fracture of surgical neck of unspecified humerus, initial encounter for closed fracture: Secondary | ICD-10-CM | POA: Diagnosis not present

## 2020-02-29 DIAGNOSIS — E0822 Diabetes mellitus due to underlying condition with diabetic chronic kidney disease: Secondary | ICD-10-CM | POA: Diagnosis not present

## 2020-02-29 DIAGNOSIS — I214 Non-ST elevation (NSTEMI) myocardial infarction: Secondary | ICD-10-CM | POA: Diagnosis not present

## 2020-02-29 DIAGNOSIS — N959 Unspecified menopausal and perimenopausal disorder: Secondary | ICD-10-CM | POA: Diagnosis not present

## 2020-02-29 DIAGNOSIS — R7309 Other abnormal glucose: Secondary | ICD-10-CM | POA: Diagnosis not present

## 2020-02-29 DIAGNOSIS — I491 Atrial premature depolarization: Secondary | ICD-10-CM | POA: Diagnosis not present

## 2020-02-29 DIAGNOSIS — W57XXXD Bitten or stung by nonvenomous insect and other nonvenomous arthropods, subsequent encounter: Secondary | ICD-10-CM | POA: Diagnosis not present

## 2020-02-29 DIAGNOSIS — R52 Pain, unspecified: Secondary | ICD-10-CM | POA: Diagnosis not present

## 2020-02-29 DIAGNOSIS — Z862 Personal history of diseases of the blood and blood-forming organs and certain disorders involving the immune mechanism: Secondary | ICD-10-CM | POA: Diagnosis not present

## 2020-02-29 DIAGNOSIS — E034 Atrophy of thyroid (acquired): Secondary | ICD-10-CM | POA: Diagnosis not present

## 2020-02-29 DIAGNOSIS — L932 Other local lupus erythematosus: Secondary | ICD-10-CM | POA: Diagnosis not present

## 2020-02-29 DIAGNOSIS — N201 Calculus of ureter: Secondary | ICD-10-CM | POA: Diagnosis not present

## 2020-02-29 DIAGNOSIS — I160 Hypertensive urgency: Secondary | ICD-10-CM | POA: Diagnosis not present

## 2020-02-29 DIAGNOSIS — M25462 Effusion, left knee: Secondary | ICD-10-CM | POA: Diagnosis not present

## 2020-02-29 DIAGNOSIS — R0781 Pleurodynia: Secondary | ICD-10-CM | POA: Diagnosis not present

## 2020-02-29 DIAGNOSIS — N898 Other specified noninflammatory disorders of vagina: Secondary | ICD-10-CM | POA: Diagnosis not present

## 2020-02-29 DIAGNOSIS — M9904 Segmental and somatic dysfunction of sacral region: Secondary | ICD-10-CM | POA: Diagnosis not present

## 2020-02-29 DIAGNOSIS — D696 Thrombocytopenia, unspecified: Secondary | ICD-10-CM | POA: Diagnosis not present

## 2020-02-29 DIAGNOSIS — S42202D Unspecified fracture of upper end of left humerus, subsequent encounter for fracture with routine healing: Secondary | ICD-10-CM | POA: Diagnosis not present

## 2020-02-29 DIAGNOSIS — F339 Major depressive disorder, recurrent, unspecified: Secondary | ICD-10-CM | POA: Diagnosis not present

## 2020-02-29 DIAGNOSIS — M25562 Pain in left knee: Secondary | ICD-10-CM | POA: Diagnosis not present

## 2020-02-29 DIAGNOSIS — I071 Rheumatic tricuspid insufficiency: Secondary | ICD-10-CM | POA: Diagnosis not present

## 2020-02-29 DIAGNOSIS — N189 Chronic kidney disease, unspecified: Secondary | ICD-10-CM | POA: Diagnosis not present

## 2020-02-29 DIAGNOSIS — M1711 Unilateral primary osteoarthritis, right knee: Secondary | ICD-10-CM | POA: Diagnosis not present

## 2020-02-29 DIAGNOSIS — Z9181 History of falling: Secondary | ICD-10-CM | POA: Diagnosis not present

## 2020-02-29 DIAGNOSIS — M23222 Derangement of posterior horn of medial meniscus due to old tear or injury, left knee: Secondary | ICD-10-CM | POA: Diagnosis not present

## 2020-02-29 DIAGNOSIS — K912 Postsurgical malabsorption, not elsewhere classified: Secondary | ICD-10-CM | POA: Diagnosis not present

## 2020-02-29 DIAGNOSIS — H6982 Other specified disorders of Eustachian tube, left ear: Secondary | ICD-10-CM | POA: Diagnosis not present

## 2020-02-29 DIAGNOSIS — C44519 Basal cell carcinoma of skin of other part of trunk: Secondary | ICD-10-CM | POA: Diagnosis not present

## 2020-02-29 DIAGNOSIS — N476 Balanoposthitis: Secondary | ICD-10-CM | POA: Diagnosis not present

## 2020-02-29 DIAGNOSIS — R1310 Dysphagia, unspecified: Secondary | ICD-10-CM | POA: Diagnosis not present

## 2020-02-29 DIAGNOSIS — K909 Intestinal malabsorption, unspecified: Secondary | ICD-10-CM | POA: Diagnosis not present

## 2020-02-29 DIAGNOSIS — H2513 Age-related nuclear cataract, bilateral: Secondary | ICD-10-CM | POA: Diagnosis not present

## 2020-02-29 DIAGNOSIS — R221 Localized swelling, mass and lump, neck: Secondary | ICD-10-CM | POA: Diagnosis not present

## 2020-02-29 DIAGNOSIS — Z1389 Encounter for screening for other disorder: Secondary | ICD-10-CM | POA: Diagnosis not present

## 2020-02-29 DIAGNOSIS — M47819 Spondylosis without myelopathy or radiculopathy, site unspecified: Secondary | ICD-10-CM | POA: Diagnosis not present

## 2020-02-29 DIAGNOSIS — C678 Malignant neoplasm of overlapping sites of bladder: Secondary | ICD-10-CM | POA: Diagnosis not present

## 2020-02-29 DIAGNOSIS — K8040 Calculus of bile duct with cholecystitis, unspecified, without obstruction: Secondary | ICD-10-CM | POA: Diagnosis not present

## 2020-02-29 DIAGNOSIS — I4819 Other persistent atrial fibrillation: Secondary | ICD-10-CM | POA: Diagnosis not present

## 2020-02-29 DIAGNOSIS — I83018 Varicose veins of right lower extremity with ulcer other part of lower leg: Secondary | ICD-10-CM | POA: Diagnosis not present

## 2020-02-29 DIAGNOSIS — M7022 Olecranon bursitis, left elbow: Secondary | ICD-10-CM | POA: Diagnosis not present

## 2020-02-29 DIAGNOSIS — N9489 Other specified conditions associated with female genital organs and menstrual cycle: Secondary | ICD-10-CM | POA: Diagnosis not present

## 2020-02-29 DIAGNOSIS — M75112 Incomplete rotator cuff tear or rupture of left shoulder, not specified as traumatic: Secondary | ICD-10-CM | POA: Diagnosis not present

## 2020-02-29 DIAGNOSIS — R928 Other abnormal and inconclusive findings on diagnostic imaging of breast: Secondary | ICD-10-CM | POA: Diagnosis not present

## 2020-02-29 DIAGNOSIS — K5090 Crohn's disease, unspecified, without complications: Secondary | ICD-10-CM | POA: Diagnosis not present

## 2020-02-29 DIAGNOSIS — I83813 Varicose veins of bilateral lower extremities with pain: Secondary | ICD-10-CM | POA: Diagnosis not present

## 2020-02-29 DIAGNOSIS — H02409 Unspecified ptosis of unspecified eyelid: Secondary | ICD-10-CM | POA: Diagnosis not present

## 2020-02-29 DIAGNOSIS — M25511 Pain in right shoulder: Secondary | ICD-10-CM | POA: Diagnosis not present

## 2020-02-29 DIAGNOSIS — N3000 Acute cystitis without hematuria: Secondary | ICD-10-CM | POA: Diagnosis not present

## 2020-02-29 DIAGNOSIS — R3915 Urgency of urination: Secondary | ICD-10-CM | POA: Diagnosis not present

## 2020-02-29 DIAGNOSIS — H353232 Exudative age-related macular degeneration, bilateral, with inactive choroidal neovascularization: Secondary | ICD-10-CM | POA: Diagnosis not present

## 2020-02-29 DIAGNOSIS — K56609 Unspecified intestinal obstruction, unspecified as to partial versus complete obstruction: Secondary | ICD-10-CM | POA: Diagnosis not present

## 2020-02-29 DIAGNOSIS — D6869 Other thrombophilia: Secondary | ICD-10-CM | POA: Diagnosis not present

## 2020-02-29 DIAGNOSIS — F458 Other somatoform disorders: Secondary | ICD-10-CM | POA: Diagnosis not present

## 2020-02-29 DIAGNOSIS — R0602 Shortness of breath: Secondary | ICD-10-CM | POA: Diagnosis not present

## 2020-02-29 DIAGNOSIS — E10649 Type 1 diabetes mellitus with hypoglycemia without coma: Secondary | ICD-10-CM | POA: Diagnosis not present

## 2020-02-29 DIAGNOSIS — M25522 Pain in left elbow: Secondary | ICD-10-CM | POA: Diagnosis not present

## 2020-02-29 DIAGNOSIS — F064 Anxiety disorder due to known physiological condition: Secondary | ICD-10-CM | POA: Diagnosis not present

## 2020-02-29 DIAGNOSIS — G501 Atypical facial pain: Secondary | ICD-10-CM | POA: Diagnosis not present

## 2020-02-29 DIAGNOSIS — R002 Palpitations: Secondary | ICD-10-CM | POA: Diagnosis not present

## 2020-02-29 DIAGNOSIS — Z825 Family history of asthma and other chronic lower respiratory diseases: Secondary | ICD-10-CM | POA: Diagnosis not present

## 2020-02-29 DIAGNOSIS — H53431 Sector or arcuate defects, right eye: Secondary | ICD-10-CM | POA: Diagnosis not present

## 2020-02-29 DIAGNOSIS — S62366A Nondisplaced fracture of neck of fifth metacarpal bone, right hand, initial encounter for closed fracture: Secondary | ICD-10-CM | POA: Diagnosis not present

## 2020-02-29 DIAGNOSIS — R945 Abnormal results of liver function studies: Secondary | ICD-10-CM | POA: Diagnosis not present

## 2020-02-29 DIAGNOSIS — N10 Acute tubulo-interstitial nephritis: Secondary | ICD-10-CM | POA: Diagnosis not present

## 2020-02-29 DIAGNOSIS — R413 Other amnesia: Secondary | ICD-10-CM | POA: Diagnosis not present

## 2020-02-29 DIAGNOSIS — I420 Dilated cardiomyopathy: Secondary | ICD-10-CM | POA: Diagnosis not present

## 2020-02-29 DIAGNOSIS — L97512 Non-pressure chronic ulcer of other part of right foot with fat layer exposed: Secondary | ICD-10-CM | POA: Diagnosis not present

## 2020-02-29 DIAGNOSIS — H52202 Unspecified astigmatism, left eye: Secondary | ICD-10-CM | POA: Diagnosis not present

## 2020-02-29 DIAGNOSIS — K551 Chronic vascular disorders of intestine: Secondary | ICD-10-CM | POA: Diagnosis not present

## 2020-02-29 DIAGNOSIS — L821 Other seborrheic keratosis: Secondary | ICD-10-CM | POA: Diagnosis not present

## 2020-02-29 DIAGNOSIS — H353211 Exudative age-related macular degeneration, right eye, with active choroidal neovascularization: Secondary | ICD-10-CM | POA: Diagnosis not present

## 2020-02-29 DIAGNOSIS — N841 Polyp of cervix uteri: Secondary | ICD-10-CM | POA: Diagnosis not present

## 2020-02-29 DIAGNOSIS — E1136 Type 2 diabetes mellitus with diabetic cataract: Secondary | ICD-10-CM | POA: Diagnosis not present

## 2020-02-29 DIAGNOSIS — S93431A Sprain of tibiofibular ligament of right ankle, initial encounter: Secondary | ICD-10-CM | POA: Diagnosis not present

## 2020-02-29 DIAGNOSIS — H538 Other visual disturbances: Secondary | ICD-10-CM | POA: Diagnosis not present

## 2020-02-29 DIAGNOSIS — D49512 Neoplasm of unspecified behavior of left kidney: Secondary | ICD-10-CM | POA: Diagnosis not present

## 2020-02-29 DIAGNOSIS — L728 Other follicular cysts of the skin and subcutaneous tissue: Secondary | ICD-10-CM | POA: Diagnosis not present

## 2020-02-29 DIAGNOSIS — E669 Obesity, unspecified: Secondary | ICD-10-CM | POA: Diagnosis not present

## 2020-02-29 DIAGNOSIS — F17210 Nicotine dependence, cigarettes, uncomplicated: Secondary | ICD-10-CM | POA: Diagnosis not present

## 2020-02-29 DIAGNOSIS — H5203 Hypermetropia, bilateral: Secondary | ICD-10-CM | POA: Diagnosis not present

## 2020-02-29 DIAGNOSIS — M85861 Other specified disorders of bone density and structure, right lower leg: Secondary | ICD-10-CM | POA: Diagnosis not present

## 2020-02-29 DIAGNOSIS — I272 Pulmonary hypertension, unspecified: Secondary | ICD-10-CM | POA: Diagnosis not present

## 2020-02-29 DIAGNOSIS — M4120 Other idiopathic scoliosis, site unspecified: Secondary | ICD-10-CM | POA: Diagnosis not present

## 2020-02-29 DIAGNOSIS — M8580 Other specified disorders of bone density and structure, unspecified site: Secondary | ICD-10-CM | POA: Diagnosis not present

## 2020-02-29 DIAGNOSIS — M5431 Sciatica, right side: Secondary | ICD-10-CM | POA: Diagnosis not present

## 2020-02-29 DIAGNOSIS — N3941 Urge incontinence: Secondary | ICD-10-CM | POA: Diagnosis not present

## 2020-02-29 DIAGNOSIS — M216X2 Other acquired deformities of left foot: Secondary | ICD-10-CM | POA: Diagnosis not present

## 2020-02-29 DIAGNOSIS — M71341 Other bursal cyst, right hand: Secondary | ICD-10-CM | POA: Diagnosis not present

## 2020-02-29 DIAGNOSIS — G7000 Myasthenia gravis without (acute) exacerbation: Secondary | ICD-10-CM | POA: Diagnosis not present

## 2020-02-29 DIAGNOSIS — L578 Other skin changes due to chronic exposure to nonionizing radiation: Secondary | ICD-10-CM | POA: Diagnosis not present

## 2020-02-29 DIAGNOSIS — E118 Type 2 diabetes mellitus with unspecified complications: Secondary | ICD-10-CM | POA: Diagnosis not present

## 2020-02-29 DIAGNOSIS — Z993 Dependence on wheelchair: Secondary | ICD-10-CM | POA: Diagnosis not present

## 2020-02-29 DIAGNOSIS — N289 Disorder of kidney and ureter, unspecified: Secondary | ICD-10-CM | POA: Diagnosis not present

## 2020-02-29 DIAGNOSIS — Z6832 Body mass index (BMI) 32.0-32.9, adult: Secondary | ICD-10-CM | POA: Diagnosis not present

## 2020-02-29 DIAGNOSIS — F438 Other reactions to severe stress: Secondary | ICD-10-CM | POA: Diagnosis not present

## 2020-02-29 DIAGNOSIS — Z87440 Personal history of urinary (tract) infections: Secondary | ICD-10-CM | POA: Diagnosis not present

## 2020-02-29 DIAGNOSIS — M67441 Ganglion, right hand: Secondary | ICD-10-CM | POA: Diagnosis not present

## 2020-02-29 DIAGNOSIS — R072 Precordial pain: Secondary | ICD-10-CM | POA: Diagnosis not present

## 2020-02-29 DIAGNOSIS — G5760 Lesion of plantar nerve, unspecified lower limb: Secondary | ICD-10-CM | POA: Diagnosis not present

## 2020-02-29 DIAGNOSIS — G9520 Unspecified cord compression: Secondary | ICD-10-CM | POA: Diagnosis not present

## 2020-02-29 DIAGNOSIS — I69328 Other speech and language deficits following cerebral infarction: Secondary | ICD-10-CM | POA: Diagnosis not present

## 2020-02-29 DIAGNOSIS — H9193 Unspecified hearing loss, bilateral: Secondary | ICD-10-CM | POA: Diagnosis not present

## 2020-02-29 DIAGNOSIS — M8000XD Age-related osteoporosis with current pathological fracture, unspecified site, subsequent encounter for fracture with routine healing: Secondary | ICD-10-CM | POA: Diagnosis not present

## 2020-02-29 DIAGNOSIS — Z888 Allergy status to other drugs, medicaments and biological substances status: Secondary | ICD-10-CM | POA: Diagnosis not present

## 2020-02-29 DIAGNOSIS — K2990 Gastroduodenitis, unspecified, without bleeding: Secondary | ICD-10-CM | POA: Diagnosis not present

## 2020-02-29 DIAGNOSIS — I951 Orthostatic hypotension: Secondary | ICD-10-CM | POA: Diagnosis not present

## 2020-02-29 DIAGNOSIS — M4722 Other spondylosis with radiculopathy, cervical region: Secondary | ICD-10-CM | POA: Diagnosis not present

## 2020-02-29 DIAGNOSIS — K50119 Crohn's disease of large intestine with unspecified complications: Secondary | ICD-10-CM | POA: Diagnosis not present

## 2020-02-29 DIAGNOSIS — F0280 Dementia in other diseases classified elsewhere without behavioral disturbance: Secondary | ICD-10-CM | POA: Diagnosis not present

## 2020-02-29 DIAGNOSIS — M4712 Other spondylosis with myelopathy, cervical region: Secondary | ICD-10-CM | POA: Diagnosis not present

## 2020-02-29 DIAGNOSIS — N390 Urinary tract infection, site not specified: Secondary | ICD-10-CM | POA: Diagnosis not present

## 2020-02-29 DIAGNOSIS — J302 Other seasonal allergic rhinitis: Secondary | ICD-10-CM | POA: Diagnosis not present

## 2020-02-29 DIAGNOSIS — S42291G Other displaced fracture of upper end of right humerus, subsequent encounter for fracture with delayed healing: Secondary | ICD-10-CM | POA: Diagnosis not present

## 2020-02-29 DIAGNOSIS — H52203 Unspecified astigmatism, bilateral: Secondary | ICD-10-CM | POA: Diagnosis not present

## 2020-02-29 DIAGNOSIS — Z8551 Personal history of malignant neoplasm of bladder: Secondary | ICD-10-CM | POA: Diagnosis not present

## 2020-02-29 DIAGNOSIS — I4510 Unspecified right bundle-branch block: Secondary | ICD-10-CM | POA: Diagnosis not present

## 2020-02-29 DIAGNOSIS — M79643 Pain in unspecified hand: Secondary | ICD-10-CM | POA: Diagnosis not present

## 2020-02-29 DIAGNOSIS — H1045 Other chronic allergic conjunctivitis: Secondary | ICD-10-CM | POA: Diagnosis not present

## 2020-02-29 DIAGNOSIS — S80861A Insect bite (nonvenomous), right lower leg, initial encounter: Secondary | ICD-10-CM | POA: Diagnosis not present

## 2020-02-29 DIAGNOSIS — Z8781 Personal history of (healed) traumatic fracture: Secondary | ICD-10-CM | POA: Diagnosis not present

## 2020-02-29 DIAGNOSIS — S42201A Unspecified fracture of upper end of right humerus, initial encounter for closed fracture: Secondary | ICD-10-CM | POA: Diagnosis not present

## 2020-02-29 DIAGNOSIS — K5730 Diverticulosis of large intestine without perforation or abscess without bleeding: Secondary | ICD-10-CM | POA: Diagnosis not present

## 2020-02-29 DIAGNOSIS — S32030A Wedge compression fracture of third lumbar vertebra, initial encounter for closed fracture: Secondary | ICD-10-CM | POA: Diagnosis not present

## 2020-02-29 DIAGNOSIS — Z1231 Encounter for screening mammogram for malignant neoplasm of breast: Secondary | ICD-10-CM | POA: Diagnosis not present

## 2020-02-29 DIAGNOSIS — Z466 Encounter for fitting and adjustment of urinary device: Secondary | ICD-10-CM | POA: Diagnosis not present

## 2020-02-29 DIAGNOSIS — M76821 Posterior tibial tendinitis, right leg: Secondary | ICD-10-CM | POA: Diagnosis not present

## 2020-02-29 DIAGNOSIS — Z6830 Body mass index (BMI) 30.0-30.9, adult: Secondary | ICD-10-CM | POA: Diagnosis not present

## 2020-02-29 DIAGNOSIS — I774 Celiac artery compression syndrome: Secondary | ICD-10-CM | POA: Diagnosis not present

## 2020-02-29 DIAGNOSIS — I495 Sick sinus syndrome: Secondary | ICD-10-CM | POA: Diagnosis not present

## 2020-02-29 DIAGNOSIS — M238X1 Other internal derangements of right knee: Secondary | ICD-10-CM | POA: Diagnosis not present

## 2020-02-29 DIAGNOSIS — R1031 Right lower quadrant pain: Secondary | ICD-10-CM | POA: Diagnosis not present

## 2020-02-29 DIAGNOSIS — C787 Secondary malignant neoplasm of liver and intrahepatic bile duct: Secondary | ICD-10-CM | POA: Diagnosis not present

## 2020-02-29 DIAGNOSIS — M3210 Systemic lupus erythematosus, organ or system involvement unspecified: Secondary | ICD-10-CM | POA: Diagnosis not present

## 2020-02-29 DIAGNOSIS — S46011D Strain of muscle(s) and tendon(s) of the rotator cuff of right shoulder, subsequent encounter: Secondary | ICD-10-CM | POA: Diagnosis not present

## 2020-02-29 DIAGNOSIS — G35 Multiple sclerosis: Secondary | ICD-10-CM | POA: Diagnosis not present

## 2020-02-29 DIAGNOSIS — Z2821 Immunization not carried out because of patient refusal: Secondary | ICD-10-CM | POA: Diagnosis not present

## 2020-02-29 DIAGNOSIS — H04123 Dry eye syndrome of bilateral lacrimal glands: Secondary | ICD-10-CM | POA: Diagnosis not present

## 2020-02-29 DIAGNOSIS — M19012 Primary osteoarthritis, left shoulder: Secondary | ICD-10-CM | POA: Diagnosis not present

## 2020-02-29 DIAGNOSIS — Z85038 Personal history of other malignant neoplasm of large intestine: Secondary | ICD-10-CM | POA: Diagnosis not present

## 2020-02-29 DIAGNOSIS — H1032 Unspecified acute conjunctivitis, left eye: Secondary | ICD-10-CM | POA: Diagnosis not present

## 2020-02-29 DIAGNOSIS — M25741 Osteophyte, right hand: Secondary | ICD-10-CM | POA: Diagnosis not present

## 2020-02-29 DIAGNOSIS — R159 Full incontinence of feces: Secondary | ICD-10-CM | POA: Diagnosis not present

## 2020-02-29 DIAGNOSIS — C250 Malignant neoplasm of head of pancreas: Secondary | ICD-10-CM | POA: Diagnosis not present

## 2020-02-29 DIAGNOSIS — K760 Fatty (change of) liver, not elsewhere classified: Secondary | ICD-10-CM | POA: Diagnosis not present

## 2020-02-29 DIAGNOSIS — Z7951 Long term (current) use of inhaled steroids: Secondary | ICD-10-CM | POA: Diagnosis not present

## 2020-02-29 DIAGNOSIS — M79641 Pain in right hand: Secondary | ICD-10-CM | POA: Diagnosis not present

## 2020-02-29 DIAGNOSIS — I2609 Other pulmonary embolism with acute cor pulmonale: Secondary | ICD-10-CM | POA: Diagnosis not present

## 2020-02-29 DIAGNOSIS — M8588 Other specified disorders of bone density and structure, other site: Secondary | ICD-10-CM | POA: Diagnosis not present

## 2020-02-29 DIAGNOSIS — F329 Major depressive disorder, single episode, unspecified: Secondary | ICD-10-CM | POA: Diagnosis not present

## 2020-02-29 DIAGNOSIS — E539 Vitamin B deficiency, unspecified: Secondary | ICD-10-CM | POA: Diagnosis not present

## 2020-02-29 DIAGNOSIS — H9313 Tinnitus, bilateral: Secondary | ICD-10-CM | POA: Diagnosis not present

## 2020-02-29 DIAGNOSIS — M13841 Other specified arthritis, right hand: Secondary | ICD-10-CM | POA: Diagnosis not present

## 2020-02-29 DIAGNOSIS — Z9841 Cataract extraction status, right eye: Secondary | ICD-10-CM | POA: Diagnosis not present

## 2020-02-29 DIAGNOSIS — Z01812 Encounter for preprocedural laboratory examination: Secondary | ICD-10-CM | POA: Diagnosis not present

## 2020-02-29 DIAGNOSIS — Z8616 Personal history of COVID-19: Secondary | ICD-10-CM | POA: Diagnosis not present

## 2020-02-29 DIAGNOSIS — H6122 Impacted cerumen, left ear: Secondary | ICD-10-CM | POA: Diagnosis not present

## 2020-02-29 DIAGNOSIS — M4802 Spinal stenosis, cervical region: Secondary | ICD-10-CM | POA: Diagnosis not present

## 2020-02-29 DIAGNOSIS — G43009 Migraine without aura, not intractable, without status migrainosus: Secondary | ICD-10-CM | POA: Diagnosis not present

## 2020-02-29 DIAGNOSIS — Z942 Lung transplant status: Secondary | ICD-10-CM | POA: Diagnosis not present

## 2020-02-29 DIAGNOSIS — M5441 Lumbago with sciatica, right side: Secondary | ICD-10-CM | POA: Diagnosis not present

## 2020-02-29 DIAGNOSIS — I739 Peripheral vascular disease, unspecified: Secondary | ICD-10-CM | POA: Diagnosis not present

## 2020-02-29 DIAGNOSIS — I2510 Atherosclerotic heart disease of native coronary artery without angina pectoris: Secondary | ICD-10-CM | POA: Diagnosis not present

## 2020-02-29 DIAGNOSIS — R9389 Abnormal findings on diagnostic imaging of other specified body structures: Secondary | ICD-10-CM | POA: Diagnosis not present

## 2020-02-29 DIAGNOSIS — Z9989 Dependence on other enabling machines and devices: Secondary | ICD-10-CM | POA: Diagnosis not present

## 2020-02-29 DIAGNOSIS — H2511 Age-related nuclear cataract, right eye: Secondary | ICD-10-CM | POA: Diagnosis not present

## 2020-02-29 DIAGNOSIS — Z6827 Body mass index (BMI) 27.0-27.9, adult: Secondary | ICD-10-CM | POA: Diagnosis not present

## 2020-02-29 DIAGNOSIS — D801 Nonfamilial hypogammaglobulinemia: Secondary | ICD-10-CM | POA: Diagnosis not present

## 2020-02-29 DIAGNOSIS — N529 Male erectile dysfunction, unspecified: Secondary | ICD-10-CM | POA: Diagnosis not present

## 2020-02-29 DIAGNOSIS — Z91040 Latex allergy status: Secondary | ICD-10-CM | POA: Diagnosis not present

## 2020-02-29 DIAGNOSIS — C673 Malignant neoplasm of anterior wall of bladder: Secondary | ICD-10-CM | POA: Diagnosis not present

## 2020-02-29 DIAGNOSIS — E43 Unspecified severe protein-calorie malnutrition: Secondary | ICD-10-CM | POA: Diagnosis not present

## 2020-02-29 DIAGNOSIS — B9689 Other specified bacterial agents as the cause of diseases classified elsewhere: Secondary | ICD-10-CM | POA: Diagnosis not present

## 2020-02-29 DIAGNOSIS — H6983 Other specified disorders of Eustachian tube, bilateral: Secondary | ICD-10-CM | POA: Diagnosis not present

## 2020-02-29 DIAGNOSIS — K5150 Left sided colitis without complications: Secondary | ICD-10-CM | POA: Diagnosis not present

## 2020-02-29 DIAGNOSIS — C4491 Basal cell carcinoma of skin, unspecified: Secondary | ICD-10-CM | POA: Diagnosis not present

## 2020-02-29 DIAGNOSIS — H40001 Preglaucoma, unspecified, right eye: Secondary | ICD-10-CM | POA: Diagnosis not present

## 2020-02-29 DIAGNOSIS — Z7982 Long term (current) use of aspirin: Secondary | ICD-10-CM | POA: Diagnosis not present

## 2020-02-29 DIAGNOSIS — I4892 Unspecified atrial flutter: Secondary | ICD-10-CM | POA: Diagnosis not present

## 2020-02-29 DIAGNOSIS — N320 Bladder-neck obstruction: Secondary | ICD-10-CM | POA: Diagnosis not present

## 2020-02-29 DIAGNOSIS — Z6829 Body mass index (BMI) 29.0-29.9, adult: Secondary | ICD-10-CM | POA: Diagnosis not present

## 2020-02-29 DIAGNOSIS — M25531 Pain in right wrist: Secondary | ICD-10-CM | POA: Diagnosis not present

## 2020-02-29 DIAGNOSIS — H3562 Retinal hemorrhage, left eye: Secondary | ICD-10-CM | POA: Diagnosis not present

## 2020-02-29 DIAGNOSIS — M87180 Osteonecrosis due to drugs, jaw: Secondary | ICD-10-CM | POA: Diagnosis not present

## 2020-02-29 DIAGNOSIS — Z820 Family history of epilepsy and other diseases of the nervous system: Secondary | ICD-10-CM | POA: Diagnosis not present

## 2020-02-29 DIAGNOSIS — Z8701 Personal history of pneumonia (recurrent): Secondary | ICD-10-CM | POA: Diagnosis not present

## 2020-02-29 DIAGNOSIS — M62830 Muscle spasm of back: Secondary | ICD-10-CM | POA: Diagnosis not present

## 2020-02-29 DIAGNOSIS — D2272 Melanocytic nevi of left lower limb, including hip: Secondary | ICD-10-CM | POA: Diagnosis not present

## 2020-02-29 DIAGNOSIS — L910 Hypertrophic scar: Secondary | ICD-10-CM | POA: Diagnosis not present

## 2020-02-29 DIAGNOSIS — D500 Iron deficiency anemia secondary to blood loss (chronic): Secondary | ICD-10-CM | POA: Diagnosis not present

## 2020-02-29 DIAGNOSIS — I513 Intracardiac thrombosis, not elsewhere classified: Secondary | ICD-10-CM | POA: Diagnosis not present

## 2020-02-29 DIAGNOSIS — D72828 Other elevated white blood cell count: Secondary | ICD-10-CM | POA: Diagnosis not present

## 2020-02-29 DIAGNOSIS — Z20828 Contact with and (suspected) exposure to other viral communicable diseases: Secondary | ICD-10-CM | POA: Diagnosis not present

## 2020-02-29 DIAGNOSIS — I499 Cardiac arrhythmia, unspecified: Secondary | ICD-10-CM | POA: Diagnosis not present

## 2020-02-29 DIAGNOSIS — R110 Nausea: Secondary | ICD-10-CM | POA: Diagnosis not present

## 2020-02-29 DIAGNOSIS — F341 Dysthymic disorder: Secondary | ICD-10-CM | POA: Diagnosis not present

## 2020-02-29 DIAGNOSIS — M7122 Synovial cyst of popliteal space [Baker], left knee: Secondary | ICD-10-CM | POA: Diagnosis not present

## 2020-02-29 DIAGNOSIS — H3589 Other specified retinal disorders: Secondary | ICD-10-CM | POA: Diagnosis not present

## 2020-02-29 DIAGNOSIS — J438 Other emphysema: Secondary | ICD-10-CM | POA: Diagnosis not present

## 2020-02-29 DIAGNOSIS — M216X1 Other acquired deformities of right foot: Secondary | ICD-10-CM | POA: Diagnosis not present

## 2020-02-29 DIAGNOSIS — H35351 Cystoid macular degeneration, right eye: Secondary | ICD-10-CM | POA: Diagnosis not present

## 2020-02-29 DIAGNOSIS — Z789 Other specified health status: Secondary | ICD-10-CM | POA: Diagnosis not present

## 2020-02-29 DIAGNOSIS — I428 Other cardiomyopathies: Secondary | ICD-10-CM | POA: Diagnosis not present

## 2020-02-29 DIAGNOSIS — U071 COVID-19: Secondary | ICD-10-CM | POA: Diagnosis not present

## 2020-02-29 DIAGNOSIS — F05 Delirium due to known physiological condition: Secondary | ICD-10-CM | POA: Diagnosis not present

## 2020-02-29 DIAGNOSIS — E232 Diabetes insipidus: Secondary | ICD-10-CM | POA: Diagnosis not present

## 2020-02-29 DIAGNOSIS — I712 Thoracic aortic aneurysm, without rupture: Secondary | ICD-10-CM | POA: Diagnosis not present

## 2020-02-29 DIAGNOSIS — M7918 Myalgia, other site: Secondary | ICD-10-CM | POA: Diagnosis not present

## 2020-02-29 DIAGNOSIS — S76011D Strain of muscle, fascia and tendon of right hip, subsequent encounter: Secondary | ICD-10-CM | POA: Diagnosis not present

## 2020-02-29 DIAGNOSIS — H673 Otitis media in diseases classified elsewhere, bilateral: Secondary | ICD-10-CM | POA: Diagnosis not present

## 2020-02-29 DIAGNOSIS — A409 Streptococcal sepsis, unspecified: Secondary | ICD-10-CM | POA: Diagnosis not present

## 2020-02-29 DIAGNOSIS — H7203 Central perforation of tympanic membrane, bilateral: Secondary | ICD-10-CM | POA: Diagnosis not present

## 2020-02-29 DIAGNOSIS — R803 Bence Jones proteinuria: Secondary | ICD-10-CM | POA: Diagnosis not present

## 2020-02-29 DIAGNOSIS — I77811 Abdominal aortic ectasia: Secondary | ICD-10-CM | POA: Diagnosis not present

## 2020-02-29 DIAGNOSIS — S42209A Unspecified fracture of upper end of unspecified humerus, initial encounter for closed fracture: Secondary | ICD-10-CM | POA: Diagnosis not present

## 2020-02-29 DIAGNOSIS — D122 Benign neoplasm of ascending colon: Secondary | ICD-10-CM | POA: Diagnosis not present

## 2020-02-29 DIAGNOSIS — T451X5A Adverse effect of antineoplastic and immunosuppressive drugs, initial encounter: Secondary | ICD-10-CM | POA: Diagnosis not present

## 2020-02-29 DIAGNOSIS — H11152 Pinguecula, left eye: Secondary | ICD-10-CM | POA: Diagnosis not present

## 2020-02-29 DIAGNOSIS — Z124 Encounter for screening for malignant neoplasm of cervix: Secondary | ICD-10-CM | POA: Diagnosis not present

## 2020-02-29 DIAGNOSIS — M179 Osteoarthritis of knee, unspecified: Secondary | ICD-10-CM | POA: Diagnosis not present

## 2020-02-29 DIAGNOSIS — H90A31 Mixed conductive and sensorineural hearing loss, unilateral, right ear with restricted hearing on the contralateral side: Secondary | ICD-10-CM | POA: Diagnosis not present

## 2020-02-29 DIAGNOSIS — I82623 Acute embolism and thrombosis of deep veins of upper extremity, bilateral: Secondary | ICD-10-CM | POA: Diagnosis not present

## 2020-02-29 DIAGNOSIS — M659 Synovitis and tenosynovitis, unspecified: Secondary | ICD-10-CM | POA: Diagnosis not present

## 2020-02-29 DIAGNOSIS — C50411 Malignant neoplasm of upper-outer quadrant of right female breast: Secondary | ICD-10-CM | POA: Diagnosis not present

## 2020-02-29 DIAGNOSIS — N280 Ischemia and infarction of kidney: Secondary | ICD-10-CM | POA: Diagnosis not present

## 2020-02-29 DIAGNOSIS — J9611 Chronic respiratory failure with hypoxia: Secondary | ICD-10-CM | POA: Diagnosis not present

## 2020-02-29 DIAGNOSIS — D125 Benign neoplasm of sigmoid colon: Secondary | ICD-10-CM | POA: Diagnosis not present

## 2020-02-29 DIAGNOSIS — H25812 Combined forms of age-related cataract, left eye: Secondary | ICD-10-CM | POA: Diagnosis not present

## 2020-02-29 DIAGNOSIS — Z681 Body mass index (BMI) 19 or less, adult: Secondary | ICD-10-CM | POA: Diagnosis not present

## 2020-02-29 DIAGNOSIS — Z01818 Encounter for other preprocedural examination: Secondary | ICD-10-CM | POA: Diagnosis not present

## 2020-02-29 DIAGNOSIS — S3991XA Unspecified injury of abdomen, initial encounter: Secondary | ICD-10-CM | POA: Diagnosis not present

## 2020-02-29 DIAGNOSIS — E113311 Type 2 diabetes mellitus with moderate nonproliferative diabetic retinopathy with macular edema, right eye: Secondary | ICD-10-CM | POA: Diagnosis not present

## 2020-02-29 DIAGNOSIS — D2361 Other benign neoplasm of skin of right upper limb, including shoulder: Secondary | ICD-10-CM | POA: Diagnosis not present

## 2020-02-29 DIAGNOSIS — L03313 Cellulitis of chest wall: Secondary | ICD-10-CM | POA: Diagnosis not present

## 2020-02-29 DIAGNOSIS — C44329 Squamous cell carcinoma of skin of other parts of face: Secondary | ICD-10-CM | POA: Diagnosis not present

## 2020-02-29 DIAGNOSIS — D509 Iron deficiency anemia, unspecified: Secondary | ICD-10-CM | POA: Diagnosis not present

## 2020-02-29 DIAGNOSIS — K7689 Other specified diseases of liver: Secondary | ICD-10-CM | POA: Diagnosis not present

## 2020-02-29 DIAGNOSIS — I351 Nonrheumatic aortic (valve) insufficiency: Secondary | ICD-10-CM | POA: Diagnosis not present

## 2020-02-29 DIAGNOSIS — I1310 Hypertensive heart and chronic kidney disease without heart failure, with stage 1 through stage 4 chronic kidney disease, or unspecified chronic kidney disease: Secondary | ICD-10-CM | POA: Diagnosis not present

## 2020-02-29 DIAGNOSIS — M25622 Stiffness of left elbow, not elsewhere classified: Secondary | ICD-10-CM | POA: Diagnosis not present

## 2020-02-29 DIAGNOSIS — E1129 Type 2 diabetes mellitus with other diabetic kidney complication: Secondary | ICD-10-CM | POA: Diagnosis not present

## 2020-02-29 DIAGNOSIS — I350 Nonrheumatic aortic (valve) stenosis: Secondary | ICD-10-CM | POA: Diagnosis not present

## 2020-02-29 DIAGNOSIS — E785 Hyperlipidemia, unspecified: Secondary | ICD-10-CM | POA: Diagnosis not present

## 2020-02-29 DIAGNOSIS — I63512 Cerebral infarction due to unspecified occlusion or stenosis of left middle cerebral artery: Secondary | ICD-10-CM | POA: Diagnosis not present

## 2020-02-29 DIAGNOSIS — I69398 Other sequelae of cerebral infarction: Secondary | ICD-10-CM | POA: Diagnosis not present

## 2020-02-29 DIAGNOSIS — M47812 Spondylosis without myelopathy or radiculopathy, cervical region: Secondary | ICD-10-CM | POA: Diagnosis not present

## 2020-02-29 DIAGNOSIS — Z79890 Hormone replacement therapy (postmenopausal): Secondary | ICD-10-CM | POA: Diagnosis not present

## 2020-02-29 DIAGNOSIS — I714 Abdominal aortic aneurysm, without rupture: Secondary | ICD-10-CM | POA: Diagnosis not present

## 2020-02-29 DIAGNOSIS — S32810K Multiple fractures of pelvis with stable disruption of pelvic ring, subsequent encounter for fracture with nonunion: Secondary | ICD-10-CM | POA: Diagnosis not present

## 2020-02-29 DIAGNOSIS — G40909 Epilepsy, unspecified, not intractable, without status epilepticus: Secondary | ICD-10-CM | POA: Diagnosis not present

## 2020-02-29 DIAGNOSIS — M5116 Intervertebral disc disorders with radiculopathy, lumbar region: Secondary | ICD-10-CM | POA: Diagnosis not present

## 2020-02-29 DIAGNOSIS — Z96653 Presence of artificial knee joint, bilateral: Secondary | ICD-10-CM | POA: Diagnosis not present

## 2020-02-29 DIAGNOSIS — R5381 Other malaise: Secondary | ICD-10-CM | POA: Diagnosis not present

## 2020-02-29 DIAGNOSIS — G4710 Hypersomnia, unspecified: Secondary | ICD-10-CM | POA: Diagnosis not present

## 2020-02-29 DIAGNOSIS — C50112 Malignant neoplasm of central portion of left female breast: Secondary | ICD-10-CM | POA: Diagnosis not present

## 2020-02-29 DIAGNOSIS — R3912 Poor urinary stream: Secondary | ICD-10-CM | POA: Diagnosis not present

## 2020-02-29 DIAGNOSIS — M2550 Pain in unspecified joint: Secondary | ICD-10-CM | POA: Diagnosis not present

## 2020-02-29 DIAGNOSIS — M159 Polyosteoarthritis, unspecified: Secondary | ICD-10-CM | POA: Diagnosis not present

## 2020-02-29 DIAGNOSIS — R634 Abnormal weight loss: Secondary | ICD-10-CM | POA: Diagnosis not present

## 2020-02-29 DIAGNOSIS — Z13220 Encounter for screening for lipoid disorders: Secondary | ICD-10-CM | POA: Diagnosis not present

## 2020-02-29 DIAGNOSIS — Z9911 Dependence on respirator [ventilator] status: Secondary | ICD-10-CM | POA: Diagnosis not present

## 2020-02-29 DIAGNOSIS — I493 Ventricular premature depolarization: Secondary | ICD-10-CM | POA: Diagnosis not present

## 2020-02-29 DIAGNOSIS — J45909 Unspecified asthma, uncomplicated: Secondary | ICD-10-CM | POA: Diagnosis not present

## 2020-02-29 DIAGNOSIS — H6121 Impacted cerumen, right ear: Secondary | ICD-10-CM | POA: Diagnosis not present

## 2020-02-29 DIAGNOSIS — K7030 Alcoholic cirrhosis of liver without ascites: Secondary | ICD-10-CM | POA: Diagnosis not present

## 2020-02-29 DIAGNOSIS — H353221 Exudative age-related macular degeneration, left eye, with active choroidal neovascularization: Secondary | ICD-10-CM | POA: Diagnosis not present

## 2020-02-29 DIAGNOSIS — G5602 Carpal tunnel syndrome, left upper limb: Secondary | ICD-10-CM | POA: Diagnosis not present

## 2020-02-29 DIAGNOSIS — I6523 Occlusion and stenosis of bilateral carotid arteries: Secondary | ICD-10-CM | POA: Diagnosis not present

## 2020-02-29 DIAGNOSIS — I201 Angina pectoris with documented spasm: Secondary | ICD-10-CM | POA: Diagnosis not present

## 2020-02-29 DIAGNOSIS — G7289 Other specified myopathies: Secondary | ICD-10-CM | POA: Diagnosis not present

## 2020-02-29 DIAGNOSIS — N319 Neuromuscular dysfunction of bladder, unspecified: Secondary | ICD-10-CM | POA: Diagnosis not present

## 2020-02-29 DIAGNOSIS — I209 Angina pectoris, unspecified: Secondary | ICD-10-CM | POA: Diagnosis not present

## 2020-02-29 DIAGNOSIS — I69392 Facial weakness following cerebral infarction: Secondary | ICD-10-CM | POA: Diagnosis not present

## 2020-02-29 DIAGNOSIS — R140 Abdominal distension (gaseous): Secondary | ICD-10-CM | POA: Diagnosis not present

## 2020-02-29 DIAGNOSIS — H26492 Other secondary cataract, left eye: Secondary | ICD-10-CM | POA: Diagnosis not present

## 2020-02-29 DIAGNOSIS — E1165 Type 2 diabetes mellitus with hyperglycemia: Secondary | ICD-10-CM | POA: Diagnosis not present

## 2020-02-29 DIAGNOSIS — K8050 Calculus of bile duct without cholangitis or cholecystitis without obstruction: Secondary | ICD-10-CM | POA: Diagnosis not present

## 2020-02-29 DIAGNOSIS — E2839 Other primary ovarian failure: Secondary | ICD-10-CM | POA: Diagnosis not present

## 2020-02-29 DIAGNOSIS — J069 Acute upper respiratory infection, unspecified: Secondary | ICD-10-CM | POA: Diagnosis not present

## 2020-02-29 DIAGNOSIS — M222X2 Patellofemoral disorders, left knee: Secondary | ICD-10-CM | POA: Diagnosis not present

## 2020-02-29 DIAGNOSIS — M25761 Osteophyte, right knee: Secondary | ICD-10-CM | POA: Diagnosis not present

## 2020-02-29 DIAGNOSIS — J439 Emphysema, unspecified: Secondary | ICD-10-CM | POA: Diagnosis not present

## 2020-02-29 DIAGNOSIS — S2220XD Unspecified fracture of sternum, subsequent encounter for fracture with routine healing: Secondary | ICD-10-CM | POA: Diagnosis not present

## 2020-02-29 DIAGNOSIS — C3490 Malignant neoplasm of unspecified part of unspecified bronchus or lung: Secondary | ICD-10-CM | POA: Diagnosis not present

## 2020-02-29 DIAGNOSIS — Z791 Long term (current) use of non-steroidal anti-inflammatories (NSAID): Secondary | ICD-10-CM | POA: Diagnosis not present

## 2020-02-29 DIAGNOSIS — C50412 Malignant neoplasm of upper-outer quadrant of left female breast: Secondary | ICD-10-CM | POA: Diagnosis not present

## 2020-02-29 DIAGNOSIS — T86290 Cardiac allograft vasculopathy: Secondary | ICD-10-CM | POA: Diagnosis not present

## 2020-02-29 DIAGNOSIS — Z435 Encounter for attention to cystostomy: Secondary | ICD-10-CM | POA: Diagnosis not present

## 2020-02-29 DIAGNOSIS — S32010A Wedge compression fracture of first lumbar vertebra, initial encounter for closed fracture: Secondary | ICD-10-CM | POA: Diagnosis not present

## 2020-02-29 DIAGNOSIS — Z4821 Encounter for aftercare following heart transplant: Secondary | ICD-10-CM | POA: Diagnosis not present

## 2020-02-29 DIAGNOSIS — M3500 Sicca syndrome, unspecified: Secondary | ICD-10-CM | POA: Diagnosis not present

## 2020-02-29 DIAGNOSIS — K5909 Other constipation: Secondary | ICD-10-CM | POA: Diagnosis not present

## 2020-02-29 DIAGNOSIS — L02412 Cutaneous abscess of left axilla: Secondary | ICD-10-CM | POA: Diagnosis not present

## 2020-02-29 DIAGNOSIS — E113391 Type 2 diabetes mellitus with moderate nonproliferative diabetic retinopathy without macular edema, right eye: Secondary | ICD-10-CM | POA: Diagnosis not present

## 2020-02-29 DIAGNOSIS — R4702 Dysphasia: Secondary | ICD-10-CM | POA: Diagnosis not present

## 2020-02-29 DIAGNOSIS — F3181 Bipolar II disorder: Secondary | ICD-10-CM | POA: Diagnosis not present

## 2020-02-29 DIAGNOSIS — J9621 Acute and chronic respiratory failure with hypoxia: Secondary | ICD-10-CM | POA: Diagnosis not present

## 2020-02-29 DIAGNOSIS — M25552 Pain in left hip: Secondary | ICD-10-CM | POA: Diagnosis not present

## 2020-02-29 DIAGNOSIS — M25559 Pain in unspecified hip: Secondary | ICD-10-CM | POA: Diagnosis not present

## 2020-02-29 DIAGNOSIS — N951 Menopausal and female climacteric states: Secondary | ICD-10-CM | POA: Diagnosis not present

## 2020-02-29 DIAGNOSIS — E038 Other specified hypothyroidism: Secondary | ICD-10-CM | POA: Diagnosis not present

## 2020-02-29 DIAGNOSIS — R0902 Hypoxemia: Secondary | ICD-10-CM | POA: Diagnosis not present

## 2020-02-29 DIAGNOSIS — Z96652 Presence of left artificial knee joint: Secondary | ICD-10-CM | POA: Diagnosis not present

## 2020-02-29 DIAGNOSIS — J90 Pleural effusion, not elsewhere classified: Secondary | ICD-10-CM | POA: Diagnosis not present

## 2020-02-29 DIAGNOSIS — H02532 Eyelid retraction right lower eyelid: Secondary | ICD-10-CM | POA: Diagnosis not present

## 2020-02-29 DIAGNOSIS — M4807 Spinal stenosis, lumbosacral region: Secondary | ICD-10-CM | POA: Diagnosis not present

## 2020-02-29 DIAGNOSIS — H18232 Secondary corneal edema, left eye: Secondary | ICD-10-CM | POA: Diagnosis not present

## 2020-02-29 DIAGNOSIS — M130 Polyarthritis, unspecified: Secondary | ICD-10-CM | POA: Diagnosis not present

## 2020-02-29 DIAGNOSIS — F4320 Adjustment disorder, unspecified: Secondary | ICD-10-CM | POA: Diagnosis not present

## 2020-02-29 DIAGNOSIS — C519 Malignant neoplasm of vulva, unspecified: Secondary | ICD-10-CM | POA: Diagnosis not present

## 2020-02-29 DIAGNOSIS — M25569 Pain in unspecified knee: Secondary | ICD-10-CM | POA: Diagnosis not present

## 2020-02-29 DIAGNOSIS — E871 Hypo-osmolality and hyponatremia: Secondary | ICD-10-CM | POA: Diagnosis not present

## 2020-02-29 DIAGNOSIS — R55 Syncope and collapse: Secondary | ICD-10-CM | POA: Diagnosis not present

## 2020-02-29 DIAGNOSIS — M65341 Trigger finger, right ring finger: Secondary | ICD-10-CM | POA: Diagnosis not present

## 2020-02-29 DIAGNOSIS — M5136 Other intervertebral disc degeneration, lumbar region: Secondary | ICD-10-CM | POA: Diagnosis not present

## 2020-02-29 DIAGNOSIS — M79642 Pain in left hand: Secondary | ICD-10-CM | POA: Diagnosis not present

## 2020-02-29 DIAGNOSIS — E1021 Type 1 diabetes mellitus with diabetic nephropathy: Secondary | ICD-10-CM | POA: Diagnosis not present

## 2020-02-29 DIAGNOSIS — I6521 Occlusion and stenosis of right carotid artery: Secondary | ICD-10-CM | POA: Diagnosis not present

## 2020-02-29 DIAGNOSIS — R6520 Severe sepsis without septic shock: Secondary | ICD-10-CM | POA: Diagnosis not present

## 2020-02-29 DIAGNOSIS — J209 Acute bronchitis, unspecified: Secondary | ICD-10-CM | POA: Diagnosis not present

## 2020-02-29 DIAGNOSIS — D3132 Benign neoplasm of left choroid: Secondary | ICD-10-CM | POA: Diagnosis not present

## 2020-02-29 DIAGNOSIS — Z9842 Cataract extraction status, left eye: Secondary | ICD-10-CM | POA: Diagnosis not present

## 2020-02-29 DIAGNOSIS — G40901 Epilepsy, unspecified, not intractable, with status epilepticus: Secondary | ICD-10-CM | POA: Diagnosis not present

## 2020-02-29 DIAGNOSIS — M7712 Lateral epicondylitis, left elbow: Secondary | ICD-10-CM | POA: Diagnosis not present

## 2020-02-29 DIAGNOSIS — M5137 Other intervertebral disc degeneration, lumbosacral region: Secondary | ICD-10-CM | POA: Diagnosis not present

## 2020-02-29 DIAGNOSIS — D630 Anemia in neoplastic disease: Secondary | ICD-10-CM | POA: Diagnosis not present

## 2020-02-29 DIAGNOSIS — Z6822 Body mass index (BMI) 22.0-22.9, adult: Secondary | ICD-10-CM | POA: Diagnosis not present

## 2020-02-29 DIAGNOSIS — M8589 Other specified disorders of bone density and structure, multiple sites: Secondary | ICD-10-CM | POA: Diagnosis not present

## 2020-02-29 DIAGNOSIS — J301 Allergic rhinitis due to pollen: Secondary | ICD-10-CM | POA: Diagnosis not present

## 2020-02-29 DIAGNOSIS — Z85828 Personal history of other malignant neoplasm of skin: Secondary | ICD-10-CM | POA: Diagnosis not present

## 2020-02-29 DIAGNOSIS — L720 Epidermal cyst: Secondary | ICD-10-CM | POA: Diagnosis not present

## 2020-02-29 DIAGNOSIS — G609 Hereditary and idiopathic neuropathy, unspecified: Secondary | ICD-10-CM | POA: Diagnosis not present

## 2020-02-29 DIAGNOSIS — C9190 Lymphoid leukemia, unspecified not having achieved remission: Secondary | ICD-10-CM | POA: Diagnosis not present

## 2020-02-29 DIAGNOSIS — H40153 Residual stage of open-angle glaucoma, bilateral: Secondary | ICD-10-CM | POA: Diagnosis not present

## 2020-02-29 DIAGNOSIS — M7521 Bicipital tendinitis, right shoulder: Secondary | ICD-10-CM | POA: Diagnosis not present

## 2020-02-29 DIAGNOSIS — R351 Nocturia: Secondary | ICD-10-CM | POA: Diagnosis not present

## 2020-02-29 DIAGNOSIS — Z9221 Personal history of antineoplastic chemotherapy: Secondary | ICD-10-CM | POA: Diagnosis not present

## 2020-02-29 DIAGNOSIS — G629 Polyneuropathy, unspecified: Secondary | ICD-10-CM | POA: Diagnosis not present

## 2020-02-29 DIAGNOSIS — S4992XA Unspecified injury of left shoulder and upper arm, initial encounter: Secondary | ICD-10-CM | POA: Diagnosis not present

## 2020-02-29 DIAGNOSIS — S46012D Strain of muscle(s) and tendon(s) of the rotator cuff of left shoulder, subsequent encounter: Secondary | ICD-10-CM | POA: Diagnosis not present

## 2020-02-29 DIAGNOSIS — R030 Elevated blood-pressure reading, without diagnosis of hypertension: Secondary | ICD-10-CM | POA: Diagnosis not present

## 2020-02-29 DIAGNOSIS — Z7689 Persons encountering health services in other specified circumstances: Secondary | ICD-10-CM | POA: Diagnosis not present

## 2020-02-29 DIAGNOSIS — K5190 Ulcerative colitis, unspecified, without complications: Secondary | ICD-10-CM | POA: Diagnosis not present

## 2020-02-29 DIAGNOSIS — G4733 Obstructive sleep apnea (adult) (pediatric): Secondary | ICD-10-CM | POA: Diagnosis not present

## 2020-02-29 DIAGNOSIS — S22070A Wedge compression fracture of T9-T10 vertebra, initial encounter for closed fracture: Secondary | ICD-10-CM | POA: Diagnosis not present

## 2020-02-29 DIAGNOSIS — J029 Acute pharyngitis, unspecified: Secondary | ICD-10-CM | POA: Diagnosis not present

## 2020-02-29 DIAGNOSIS — S86002D Unspecified injury of left Achilles tendon, subsequent encounter: Secondary | ICD-10-CM | POA: Diagnosis not present

## 2020-02-29 DIAGNOSIS — F4001 Agoraphobia with panic disorder: Secondary | ICD-10-CM | POA: Diagnosis not present

## 2020-02-29 DIAGNOSIS — H04521 Eversion of right lacrimal punctum: Secondary | ICD-10-CM | POA: Diagnosis not present

## 2020-02-29 DIAGNOSIS — H6123 Impacted cerumen, bilateral: Secondary | ICD-10-CM | POA: Diagnosis not present

## 2020-02-29 DIAGNOSIS — B373 Candidiasis of vulva and vagina: Secondary | ICD-10-CM | POA: Diagnosis not present

## 2020-02-29 DIAGNOSIS — Z823 Family history of stroke: Secondary | ICD-10-CM | POA: Diagnosis not present

## 2020-02-29 DIAGNOSIS — M19072 Primary osteoarthritis, left ankle and foot: Secondary | ICD-10-CM | POA: Diagnosis not present

## 2020-02-29 DIAGNOSIS — Z4689 Encounter for fitting and adjustment of other specified devices: Secondary | ICD-10-CM | POA: Diagnosis not present

## 2020-02-29 DIAGNOSIS — J449 Chronic obstructive pulmonary disease, unspecified: Secondary | ICD-10-CM | POA: Diagnosis not present

## 2020-02-29 DIAGNOSIS — N400 Enlarged prostate without lower urinary tract symptoms: Secondary | ICD-10-CM | POA: Diagnosis not present

## 2020-02-29 DIAGNOSIS — M9973 Connective tissue and disc stenosis of intervertebral foramina of lumbar region: Secondary | ICD-10-CM | POA: Diagnosis not present

## 2020-02-29 DIAGNOSIS — M109 Gout, unspecified: Secondary | ICD-10-CM | POA: Diagnosis not present

## 2020-02-29 DIAGNOSIS — Z8639 Personal history of other endocrine, nutritional and metabolic disease: Secondary | ICD-10-CM | POA: Diagnosis not present

## 2020-02-29 DIAGNOSIS — N3010 Interstitial cystitis (chronic) without hematuria: Secondary | ICD-10-CM | POA: Diagnosis not present

## 2020-02-29 DIAGNOSIS — H401112 Primary open-angle glaucoma, right eye, moderate stage: Secondary | ICD-10-CM | POA: Diagnosis not present

## 2020-02-29 DIAGNOSIS — R768 Other specified abnormal immunological findings in serum: Secondary | ICD-10-CM | POA: Diagnosis not present

## 2020-02-29 DIAGNOSIS — Z9884 Bariatric surgery status: Secondary | ICD-10-CM | POA: Diagnosis not present

## 2020-02-29 DIAGNOSIS — I679 Cerebrovascular disease, unspecified: Secondary | ICD-10-CM | POA: Diagnosis not present

## 2020-02-29 DIAGNOSIS — E876 Hypokalemia: Secondary | ICD-10-CM | POA: Diagnosis not present

## 2020-02-29 DIAGNOSIS — S62102G Fracture of unspecified carpal bone, left wrist, subsequent encounter for fracture with delayed healing: Secondary | ICD-10-CM | POA: Diagnosis not present

## 2020-02-29 DIAGNOSIS — J9601 Acute respiratory failure with hypoxia: Secondary | ICD-10-CM | POA: Diagnosis not present

## 2020-02-29 DIAGNOSIS — S39012A Strain of muscle, fascia and tendon of lower back, initial encounter: Secondary | ICD-10-CM | POA: Diagnosis not present

## 2020-02-29 DIAGNOSIS — E8889 Other specified metabolic disorders: Secondary | ICD-10-CM | POA: Diagnosis not present

## 2020-02-29 DIAGNOSIS — H35422 Microcystoid degeneration of retina, left eye: Secondary | ICD-10-CM | POA: Diagnosis not present

## 2020-02-29 DIAGNOSIS — Z5111 Encounter for antineoplastic chemotherapy: Secondary | ICD-10-CM | POA: Diagnosis not present

## 2020-02-29 DIAGNOSIS — G3184 Mild cognitive impairment, so stated: Secondary | ICD-10-CM | POA: Diagnosis not present

## 2020-02-29 DIAGNOSIS — M5126 Other intervertebral disc displacement, lumbar region: Secondary | ICD-10-CM | POA: Diagnosis not present

## 2020-02-29 DIAGNOSIS — R7989 Other specified abnormal findings of blood chemistry: Secondary | ICD-10-CM | POA: Diagnosis not present

## 2020-02-29 DIAGNOSIS — K2100 Gastro-esophageal reflux disease with esophagitis, without bleeding: Secondary | ICD-10-CM | POA: Diagnosis not present

## 2020-02-29 DIAGNOSIS — Z79811 Long term (current) use of aromatase inhibitors: Secondary | ICD-10-CM | POA: Diagnosis not present

## 2020-02-29 DIAGNOSIS — D692 Other nonthrombocytopenic purpura: Secondary | ICD-10-CM | POA: Diagnosis not present

## 2020-02-29 DIAGNOSIS — L97211 Non-pressure chronic ulcer of right calf limited to breakdown of skin: Secondary | ICD-10-CM | POA: Diagnosis not present

## 2020-02-29 DIAGNOSIS — R42 Dizziness and giddiness: Secondary | ICD-10-CM | POA: Diagnosis not present

## 2020-02-29 DIAGNOSIS — J441 Chronic obstructive pulmonary disease with (acute) exacerbation: Secondary | ICD-10-CM | POA: Diagnosis not present

## 2020-02-29 DIAGNOSIS — M7551 Bursitis of right shoulder: Secondary | ICD-10-CM | POA: Diagnosis not present

## 2020-02-29 DIAGNOSIS — Z96649 Presence of unspecified artificial hip joint: Secondary | ICD-10-CM | POA: Diagnosis not present

## 2020-02-29 DIAGNOSIS — D473 Essential (hemorrhagic) thrombocythemia: Secondary | ICD-10-CM | POA: Diagnosis not present

## 2020-02-29 DIAGNOSIS — D72818 Other decreased white blood cell count: Secondary | ICD-10-CM | POA: Diagnosis not present

## 2020-02-29 DIAGNOSIS — D709 Neutropenia, unspecified: Secondary | ICD-10-CM | POA: Diagnosis not present

## 2020-02-29 DIAGNOSIS — Z03818 Encounter for observation for suspected exposure to other biological agents ruled out: Secondary | ICD-10-CM | POA: Diagnosis not present

## 2020-02-29 DIAGNOSIS — I4891 Unspecified atrial fibrillation: Secondary | ICD-10-CM | POA: Diagnosis not present

## 2020-02-29 DIAGNOSIS — M80052D Age-related osteoporosis with current pathological fracture, left femur, subsequent encounter for fracture with routine healing: Secondary | ICD-10-CM | POA: Diagnosis not present

## 2020-02-29 DIAGNOSIS — S72001D Fracture of unspecified part of neck of right femur, subsequent encounter for closed fracture with routine healing: Secondary | ICD-10-CM | POA: Diagnosis not present

## 2020-02-29 DIAGNOSIS — M4727 Other spondylosis with radiculopathy, lumbosacral region: Secondary | ICD-10-CM | POA: Diagnosis not present

## 2020-02-29 DIAGNOSIS — M1120 Other chondrocalcinosis, unspecified site: Secondary | ICD-10-CM | POA: Diagnosis not present

## 2020-02-29 DIAGNOSIS — Z8249 Family history of ischemic heart disease and other diseases of the circulatory system: Secondary | ICD-10-CM | POA: Diagnosis not present

## 2020-02-29 DIAGNOSIS — R197 Diarrhea, unspecified: Secondary | ICD-10-CM | POA: Diagnosis not present

## 2020-02-29 DIAGNOSIS — M25571 Pain in right ankle and joints of right foot: Secondary | ICD-10-CM | POA: Diagnosis not present

## 2020-02-29 DIAGNOSIS — R0600 Dyspnea, unspecified: Secondary | ICD-10-CM | POA: Diagnosis not present

## 2020-02-29 DIAGNOSIS — J410 Simple chronic bronchitis: Secondary | ICD-10-CM | POA: Diagnosis not present

## 2020-02-29 DIAGNOSIS — S81802D Unspecified open wound, left lower leg, subsequent encounter: Secondary | ICD-10-CM | POA: Diagnosis not present

## 2020-02-29 DIAGNOSIS — R109 Unspecified abdominal pain: Secondary | ICD-10-CM | POA: Diagnosis not present

## 2020-02-29 DIAGNOSIS — Z7901 Long term (current) use of anticoagulants: Secondary | ICD-10-CM | POA: Diagnosis not present

## 2020-02-29 DIAGNOSIS — J918 Pleural effusion in other conditions classified elsewhere: Secondary | ICD-10-CM | POA: Diagnosis not present

## 2020-02-29 DIAGNOSIS — C44619 Basal cell carcinoma of skin of left upper limb, including shoulder: Secondary | ICD-10-CM | POA: Diagnosis not present

## 2020-02-29 DIAGNOSIS — T50905D Adverse effect of unspecified drugs, medicaments and biological substances, subsequent encounter: Secondary | ICD-10-CM | POA: Diagnosis not present

## 2020-02-29 DIAGNOSIS — K5900 Constipation, unspecified: Secondary | ICD-10-CM | POA: Diagnosis not present

## 2020-02-29 DIAGNOSIS — H10413 Chronic giant papillary conjunctivitis, bilateral: Secondary | ICD-10-CM | POA: Diagnosis not present

## 2020-02-29 DIAGNOSIS — M1A9XX0 Chronic gout, unspecified, without tophus (tophi): Secondary | ICD-10-CM | POA: Diagnosis not present

## 2020-02-29 DIAGNOSIS — Z131 Encounter for screening for diabetes mellitus: Secondary | ICD-10-CM | POA: Diagnosis not present

## 2020-02-29 DIAGNOSIS — C9110 Chronic lymphocytic leukemia of B-cell type not having achieved remission: Secondary | ICD-10-CM | POA: Diagnosis not present

## 2020-02-29 DIAGNOSIS — I472 Ventricular tachycardia: Secondary | ICD-10-CM | POA: Diagnosis not present

## 2020-02-29 DIAGNOSIS — R748 Abnormal levels of other serum enzymes: Secondary | ICD-10-CM | POA: Diagnosis not present

## 2020-02-29 DIAGNOSIS — F430 Acute stress reaction: Secondary | ICD-10-CM | POA: Diagnosis not present

## 2020-02-29 DIAGNOSIS — H35043 Retinal micro-aneurysms, unspecified, bilateral: Secondary | ICD-10-CM | POA: Diagnosis not present

## 2020-02-29 DIAGNOSIS — H401131 Primary open-angle glaucoma, bilateral, mild stage: Secondary | ICD-10-CM | POA: Diagnosis not present

## 2020-02-29 DIAGNOSIS — E870 Hyperosmolality and hypernatremia: Secondary | ICD-10-CM | POA: Diagnosis not present

## 2020-02-29 DIAGNOSIS — J984 Other disorders of lung: Secondary | ICD-10-CM | POA: Diagnosis not present

## 2020-02-29 DIAGNOSIS — M48061 Spinal stenosis, lumbar region without neurogenic claudication: Secondary | ICD-10-CM | POA: Diagnosis not present

## 2020-02-29 DIAGNOSIS — M79603 Pain in arm, unspecified: Secondary | ICD-10-CM | POA: Diagnosis not present

## 2020-02-29 DIAGNOSIS — D0339 Melanoma in situ of other parts of face: Secondary | ICD-10-CM | POA: Diagnosis not present

## 2020-02-29 DIAGNOSIS — H9312 Tinnitus, left ear: Secondary | ICD-10-CM | POA: Diagnosis not present

## 2020-02-29 DIAGNOSIS — H6063 Unspecified chronic otitis externa, bilateral: Secondary | ICD-10-CM | POA: Diagnosis not present

## 2020-02-29 DIAGNOSIS — M8700 Idiopathic aseptic necrosis of unspecified bone: Secondary | ICD-10-CM | POA: Diagnosis not present

## 2020-02-29 DIAGNOSIS — M5033 Other cervical disc degeneration, cervicothoracic region: Secondary | ICD-10-CM | POA: Diagnosis not present

## 2020-02-29 DIAGNOSIS — Z950 Presence of cardiac pacemaker: Secondary | ICD-10-CM | POA: Diagnosis not present

## 2020-02-29 DIAGNOSIS — L249 Irritant contact dermatitis, unspecified cause: Secondary | ICD-10-CM | POA: Diagnosis not present

## 2020-02-29 DIAGNOSIS — Z961 Presence of intraocular lens: Secondary | ICD-10-CM | POA: Diagnosis not present

## 2020-02-29 DIAGNOSIS — I452 Bifascicular block: Secondary | ICD-10-CM | POA: Diagnosis not present

## 2020-02-29 DIAGNOSIS — H43811 Vitreous degeneration, right eye: Secondary | ICD-10-CM | POA: Diagnosis not present

## 2020-02-29 DIAGNOSIS — R4781 Slurred speech: Secondary | ICD-10-CM | POA: Diagnosis not present

## 2020-02-29 DIAGNOSIS — L97511 Non-pressure chronic ulcer of other part of right foot limited to breakdown of skin: Secondary | ICD-10-CM | POA: Diagnosis not present

## 2020-02-29 DIAGNOSIS — M436 Torticollis: Secondary | ICD-10-CM | POA: Diagnosis not present

## 2020-02-29 DIAGNOSIS — H02132 Senile ectropion of right lower eyelid: Secondary | ICD-10-CM | POA: Diagnosis not present

## 2020-02-29 DIAGNOSIS — K641 Second degree hemorrhoids: Secondary | ICD-10-CM | POA: Diagnosis not present

## 2020-02-29 DIAGNOSIS — D688 Other specified coagulation defects: Secondary | ICD-10-CM | POA: Diagnosis not present

## 2020-02-29 DIAGNOSIS — L929 Granulomatous disorder of the skin and subcutaneous tissue, unspecified: Secondary | ICD-10-CM | POA: Diagnosis not present

## 2020-02-29 DIAGNOSIS — R531 Weakness: Secondary | ICD-10-CM | POA: Diagnosis not present

## 2020-02-29 DIAGNOSIS — M6701 Short Achilles tendon (acquired), right ankle: Secondary | ICD-10-CM | POA: Diagnosis not present

## 2020-02-29 DIAGNOSIS — Y92009 Unspecified place in unspecified non-institutional (private) residence as the place of occurrence of the external cause: Secondary | ICD-10-CM | POA: Diagnosis not present

## 2020-02-29 DIAGNOSIS — L298 Other pruritus: Secondary | ICD-10-CM | POA: Diagnosis not present

## 2020-02-29 DIAGNOSIS — R0989 Other specified symptoms and signs involving the circulatory and respiratory systems: Secondary | ICD-10-CM | POA: Diagnosis not present

## 2020-02-29 DIAGNOSIS — R5383 Other fatigue: Secondary | ICD-10-CM | POA: Diagnosis not present

## 2020-02-29 DIAGNOSIS — I509 Heart failure, unspecified: Secondary | ICD-10-CM | POA: Diagnosis not present

## 2020-02-29 DIAGNOSIS — J811 Chronic pulmonary edema: Secondary | ICD-10-CM | POA: Diagnosis not present

## 2020-02-29 DIAGNOSIS — I5033 Acute on chronic diastolic (congestive) heart failure: Secondary | ICD-10-CM | POA: Diagnosis not present

## 2020-02-29 DIAGNOSIS — I25710 Atherosclerosis of autologous vein coronary artery bypass graft(s) with unstable angina pectoris: Secondary | ICD-10-CM | POA: Diagnosis not present

## 2020-02-29 DIAGNOSIS — I6782 Cerebral ischemia: Secondary | ICD-10-CM | POA: Diagnosis not present

## 2020-02-29 DIAGNOSIS — R718 Other abnormality of red blood cells: Secondary | ICD-10-CM | POA: Diagnosis not present

## 2020-02-29 DIAGNOSIS — M7989 Other specified soft tissue disorders: Secondary | ICD-10-CM | POA: Diagnosis not present

## 2020-02-29 DIAGNOSIS — K089 Disorder of teeth and supporting structures, unspecified: Secondary | ICD-10-CM | POA: Diagnosis not present

## 2020-02-29 DIAGNOSIS — H35371 Puckering of macula, right eye: Secondary | ICD-10-CM | POA: Diagnosis not present

## 2020-02-29 DIAGNOSIS — H25043 Posterior subcapsular polar age-related cataract, bilateral: Secondary | ICD-10-CM | POA: Diagnosis not present

## 2020-02-29 DIAGNOSIS — I0981 Rheumatic heart failure: Secondary | ICD-10-CM | POA: Diagnosis not present

## 2020-02-29 DIAGNOSIS — G252 Other specified forms of tremor: Secondary | ICD-10-CM | POA: Diagnosis not present

## 2020-02-29 DIAGNOSIS — M79675 Pain in left toe(s): Secondary | ICD-10-CM | POA: Diagnosis not present

## 2020-02-29 DIAGNOSIS — K589 Irritable bowel syndrome without diarrhea: Secondary | ICD-10-CM | POA: Diagnosis not present

## 2020-02-29 DIAGNOSIS — H409 Unspecified glaucoma: Secondary | ICD-10-CM | POA: Diagnosis not present

## 2020-02-29 DIAGNOSIS — M5117 Intervertebral disc disorders with radiculopathy, lumbosacral region: Secondary | ICD-10-CM | POA: Diagnosis not present

## 2020-02-29 DIAGNOSIS — D1801 Hemangioma of skin and subcutaneous tissue: Secondary | ICD-10-CM | POA: Diagnosis not present

## 2020-02-29 DIAGNOSIS — Z8739 Personal history of other diseases of the musculoskeletal system and connective tissue: Secondary | ICD-10-CM | POA: Diagnosis not present

## 2020-02-29 DIAGNOSIS — Z1331 Encounter for screening for depression: Secondary | ICD-10-CM | POA: Diagnosis not present

## 2020-02-29 DIAGNOSIS — G479 Sleep disorder, unspecified: Secondary | ICD-10-CM | POA: Diagnosis not present

## 2020-02-29 DIAGNOSIS — D61818 Other pancytopenia: Secondary | ICD-10-CM | POA: Diagnosis not present

## 2020-02-29 DIAGNOSIS — M791 Myalgia, unspecified site: Secondary | ICD-10-CM | POA: Diagnosis not present

## 2020-02-29 DIAGNOSIS — R6889 Other general symptoms and signs: Secondary | ICD-10-CM | POA: Diagnosis not present

## 2020-02-29 DIAGNOSIS — R220 Localized swelling, mass and lump, head: Secondary | ICD-10-CM | POA: Diagnosis not present

## 2020-02-29 DIAGNOSIS — E041 Nontoxic single thyroid nodule: Secondary | ICD-10-CM | POA: Diagnosis not present

## 2020-02-29 DIAGNOSIS — F5089 Other specified eating disorder: Secondary | ICD-10-CM | POA: Diagnosis not present

## 2020-02-29 DIAGNOSIS — Z902 Acquired absence of lung [part of]: Secondary | ICD-10-CM | POA: Diagnosis not present

## 2020-02-29 DIAGNOSIS — Z170 Estrogen receptor positive status [ER+]: Secondary | ICD-10-CM | POA: Diagnosis not present

## 2020-02-29 DIAGNOSIS — I70213 Atherosclerosis of native arteries of extremities with intermittent claudication, bilateral legs: Secondary | ICD-10-CM | POA: Diagnosis not present

## 2020-02-29 DIAGNOSIS — S80822A Blister (nonthermal), left lower leg, initial encounter: Secondary | ICD-10-CM | POA: Diagnosis not present

## 2020-02-29 DIAGNOSIS — S86312D Strain of muscle(s) and tendon(s) of peroneal muscle group at lower leg level, left leg, subsequent encounter: Secondary | ICD-10-CM | POA: Diagnosis not present

## 2020-02-29 DIAGNOSIS — H34831 Tributary (branch) retinal vein occlusion, right eye, with macular edema: Secondary | ICD-10-CM | POA: Diagnosis not present

## 2020-02-29 DIAGNOSIS — R260 Ataxic gait: Secondary | ICD-10-CM | POA: Diagnosis not present

## 2020-02-29 DIAGNOSIS — R2243 Localized swelling, mass and lump, lower limb, bilateral: Secondary | ICD-10-CM | POA: Diagnosis not present

## 2020-02-29 DIAGNOSIS — R29818 Other symptoms and signs involving the nervous system: Secondary | ICD-10-CM | POA: Diagnosis not present

## 2020-02-29 DIAGNOSIS — E088 Diabetes mellitus due to underlying condition with unspecified complications: Secondary | ICD-10-CM | POA: Diagnosis not present

## 2020-02-29 DIAGNOSIS — H02112 Cicatricial ectropion of right lower eyelid: Secondary | ICD-10-CM | POA: Diagnosis not present

## 2020-02-29 DIAGNOSIS — M151 Heberden's nodes (with arthropathy): Secondary | ICD-10-CM | POA: Diagnosis not present

## 2020-02-29 DIAGNOSIS — L97312 Non-pressure chronic ulcer of right ankle with fat layer exposed: Secondary | ICD-10-CM | POA: Diagnosis not present

## 2020-02-29 DIAGNOSIS — M272 Inflammatory conditions of jaws: Secondary | ICD-10-CM | POA: Diagnosis not present

## 2020-02-29 DIAGNOSIS — N6489 Other specified disorders of breast: Secondary | ICD-10-CM | POA: Diagnosis not present

## 2020-02-29 DIAGNOSIS — E2740 Unspecified adrenocortical insufficiency: Secondary | ICD-10-CM | POA: Diagnosis not present

## 2020-02-29 DIAGNOSIS — Z139 Encounter for screening, unspecified: Secondary | ICD-10-CM | POA: Diagnosis not present

## 2020-02-29 DIAGNOSIS — S81801A Unspecified open wound, right lower leg, initial encounter: Secondary | ICD-10-CM | POA: Diagnosis not present

## 2020-02-29 DIAGNOSIS — Z9981 Dependence on supplemental oxygen: Secondary | ICD-10-CM | POA: Diagnosis not present

## 2020-02-29 DIAGNOSIS — S46101D Unspecified injury of muscle, fascia and tendon of long head of biceps, right arm, subsequent encounter: Secondary | ICD-10-CM | POA: Diagnosis not present

## 2020-02-29 DIAGNOSIS — Z85841 Personal history of malignant neoplasm of brain: Secondary | ICD-10-CM | POA: Diagnosis not present

## 2020-02-29 DIAGNOSIS — I25110 Atherosclerotic heart disease of native coronary artery with unstable angina pectoris: Secondary | ICD-10-CM | POA: Diagnosis not present

## 2020-02-29 DIAGNOSIS — C259 Malignant neoplasm of pancreas, unspecified: Secondary | ICD-10-CM | POA: Diagnosis not present

## 2020-02-29 DIAGNOSIS — T188XXA Foreign body in other parts of alimentary tract, initial encounter: Secondary | ICD-10-CM | POA: Diagnosis not present

## 2020-02-29 DIAGNOSIS — J440 Chronic obstructive pulmonary disease with acute lower respiratory infection: Secondary | ICD-10-CM | POA: Diagnosis not present

## 2020-02-29 DIAGNOSIS — H25041 Posterior subcapsular polar age-related cataract, right eye: Secondary | ICD-10-CM | POA: Diagnosis not present

## 2020-02-29 DIAGNOSIS — R911 Solitary pulmonary nodule: Secondary | ICD-10-CM | POA: Diagnosis not present

## 2020-02-29 DIAGNOSIS — N200 Calculus of kidney: Secondary | ICD-10-CM | POA: Diagnosis not present

## 2020-02-29 DIAGNOSIS — E113592 Type 2 diabetes mellitus with proliferative diabetic retinopathy without macular edema, left eye: Secondary | ICD-10-CM | POA: Diagnosis not present

## 2020-02-29 DIAGNOSIS — L089 Local infection of the skin and subcutaneous tissue, unspecified: Secondary | ICD-10-CM | POA: Diagnosis not present

## 2020-02-29 DIAGNOSIS — Z96612 Presence of left artificial shoulder joint: Secondary | ICD-10-CM | POA: Diagnosis not present

## 2020-02-29 DIAGNOSIS — C884 Extranodal marginal zone B-cell lymphoma of mucosa-associated lymphoid tissue [MALT-lymphoma]: Secondary | ICD-10-CM | POA: Diagnosis not present

## 2020-02-29 DIAGNOSIS — M549 Dorsalgia, unspecified: Secondary | ICD-10-CM | POA: Diagnosis not present

## 2020-02-29 DIAGNOSIS — D689 Coagulation defect, unspecified: Secondary | ICD-10-CM | POA: Diagnosis not present

## 2020-02-29 DIAGNOSIS — J3089 Other allergic rhinitis: Secondary | ICD-10-CM | POA: Diagnosis not present

## 2020-02-29 DIAGNOSIS — N184 Chronic kidney disease, stage 4 (severe): Secondary | ICD-10-CM | POA: Diagnosis not present

## 2020-02-29 DIAGNOSIS — F4310 Post-traumatic stress disorder, unspecified: Secondary | ICD-10-CM | POA: Diagnosis not present

## 2020-02-29 DIAGNOSIS — R972 Elevated prostate specific antigen [PSA]: Secondary | ICD-10-CM | POA: Diagnosis not present

## 2020-02-29 DIAGNOSIS — R609 Edema, unspecified: Secondary | ICD-10-CM | POA: Diagnosis not present

## 2020-02-29 DIAGNOSIS — H90A22 Sensorineural hearing loss, unilateral, left ear, with restricted hearing on the contralateral side: Secondary | ICD-10-CM | POA: Diagnosis not present

## 2020-02-29 DIAGNOSIS — M4810 Ankylosing hyperostosis [Forestier], site unspecified: Secondary | ICD-10-CM | POA: Diagnosis not present

## 2020-02-29 DIAGNOSIS — R251 Tremor, unspecified: Secondary | ICD-10-CM | POA: Diagnosis not present

## 2020-02-29 DIAGNOSIS — H40053 Ocular hypertension, bilateral: Secondary | ICD-10-CM | POA: Diagnosis not present

## 2020-02-29 DIAGNOSIS — H5340 Unspecified visual field defects: Secondary | ICD-10-CM | POA: Diagnosis not present

## 2020-02-29 DIAGNOSIS — D519 Vitamin B12 deficiency anemia, unspecified: Secondary | ICD-10-CM | POA: Diagnosis not present

## 2020-02-29 DIAGNOSIS — Z880 Allergy status to penicillin: Secondary | ICD-10-CM | POA: Diagnosis not present

## 2020-02-29 DIAGNOSIS — R300 Dysuria: Secondary | ICD-10-CM | POA: Diagnosis not present

## 2020-02-29 DIAGNOSIS — F4323 Adjustment disorder with mixed anxiety and depressed mood: Secondary | ICD-10-CM | POA: Diagnosis not present

## 2020-02-29 DIAGNOSIS — E6609 Other obesity due to excess calories: Secondary | ICD-10-CM | POA: Diagnosis not present

## 2020-02-29 DIAGNOSIS — L905 Scar conditions and fibrosis of skin: Secondary | ICD-10-CM | POA: Diagnosis not present

## 2020-02-29 DIAGNOSIS — D571 Sickle-cell disease without crisis: Secondary | ICD-10-CM | POA: Diagnosis not present

## 2020-02-29 DIAGNOSIS — H43393 Other vitreous opacities, bilateral: Secondary | ICD-10-CM | POA: Diagnosis not present

## 2020-02-29 DIAGNOSIS — K582 Mixed irritable bowel syndrome: Secondary | ICD-10-CM | POA: Diagnosis not present

## 2020-02-29 DIAGNOSIS — C44219 Basal cell carcinoma of skin of left ear and external auricular canal: Secondary | ICD-10-CM | POA: Diagnosis not present

## 2020-02-29 DIAGNOSIS — R591 Generalized enlarged lymph nodes: Secondary | ICD-10-CM | POA: Diagnosis not present

## 2020-02-29 DIAGNOSIS — I255 Ischemic cardiomyopathy: Secondary | ICD-10-CM | POA: Diagnosis not present

## 2020-02-29 DIAGNOSIS — R0683 Snoring: Secondary | ICD-10-CM | POA: Diagnosis not present

## 2020-02-29 DIAGNOSIS — J329 Chronic sinusitis, unspecified: Secondary | ICD-10-CM | POA: Diagnosis not present

## 2020-02-29 DIAGNOSIS — H353131 Nonexudative age-related macular degeneration, bilateral, early dry stage: Secondary | ICD-10-CM | POA: Diagnosis not present

## 2020-02-29 DIAGNOSIS — N12 Tubulo-interstitial nephritis, not specified as acute or chronic: Secondary | ICD-10-CM | POA: Diagnosis not present

## 2020-02-29 DIAGNOSIS — M797 Fibromyalgia: Secondary | ICD-10-CM | POA: Diagnosis not present

## 2020-02-29 DIAGNOSIS — I619 Nontraumatic intracerebral hemorrhage, unspecified: Secondary | ICD-10-CM | POA: Diagnosis not present

## 2020-02-29 DIAGNOSIS — M85852 Other specified disorders of bone density and structure, left thigh: Secondary | ICD-10-CM | POA: Diagnosis not present

## 2020-02-29 DIAGNOSIS — R112 Nausea with vomiting, unspecified: Secondary | ICD-10-CM | POA: Diagnosis not present

## 2020-02-29 DIAGNOSIS — A319 Mycobacterial infection, unspecified: Secondary | ICD-10-CM | POA: Diagnosis not present

## 2020-02-29 DIAGNOSIS — Z982 Presence of cerebrospinal fluid drainage device: Secondary | ICD-10-CM | POA: Diagnosis not present

## 2020-02-29 DIAGNOSIS — K8020 Calculus of gallbladder without cholecystitis without obstruction: Secondary | ICD-10-CM | POA: Diagnosis not present

## 2020-02-29 DIAGNOSIS — J9691 Respiratory failure, unspecified with hypoxia: Secondary | ICD-10-CM | POA: Diagnosis not present

## 2020-02-29 DIAGNOSIS — F3131 Bipolar disorder, current episode depressed, mild: Secondary | ICD-10-CM | POA: Diagnosis not present

## 2020-02-29 DIAGNOSIS — M533 Sacrococcygeal disorders, not elsewhere classified: Secondary | ICD-10-CM | POA: Diagnosis not present

## 2020-02-29 DIAGNOSIS — R29898 Other symptoms and signs involving the musculoskeletal system: Secondary | ICD-10-CM | POA: Diagnosis not present

## 2020-02-29 DIAGNOSIS — K3184 Gastroparesis: Secondary | ICD-10-CM | POA: Diagnosis not present

## 2020-02-29 DIAGNOSIS — H401132 Primary open-angle glaucoma, bilateral, moderate stage: Secondary | ICD-10-CM | POA: Diagnosis not present

## 2020-02-29 DIAGNOSIS — R069 Unspecified abnormalities of breathing: Secondary | ICD-10-CM | POA: Diagnosis not present

## 2020-02-29 DIAGNOSIS — K635 Polyp of colon: Secondary | ICD-10-CM | POA: Diagnosis not present

## 2020-02-29 DIAGNOSIS — F0391 Unspecified dementia with behavioral disturbance: Secondary | ICD-10-CM | POA: Diagnosis not present

## 2020-02-29 DIAGNOSIS — Z515 Encounter for palliative care: Secondary | ICD-10-CM | POA: Diagnosis not present

## 2020-02-29 DIAGNOSIS — S81812A Laceration without foreign body, left lower leg, initial encounter: Secondary | ICD-10-CM | POA: Diagnosis not present

## 2020-02-29 DIAGNOSIS — J9622 Acute and chronic respiratory failure with hypercapnia: Secondary | ICD-10-CM | POA: Diagnosis not present

## 2020-02-29 DIAGNOSIS — H5371 Glare sensitivity: Secondary | ICD-10-CM | POA: Diagnosis not present

## 2020-02-29 DIAGNOSIS — D123 Benign neoplasm of transverse colon: Secondary | ICD-10-CM | POA: Diagnosis not present

## 2020-02-29 DIAGNOSIS — E538 Deficiency of other specified B group vitamins: Secondary | ICD-10-CM | POA: Diagnosis not present

## 2020-02-29 DIAGNOSIS — I051 Rheumatic mitral insufficiency: Secondary | ICD-10-CM | POA: Diagnosis not present

## 2020-02-29 DIAGNOSIS — I82629 Acute embolism and thrombosis of deep veins of unspecified upper extremity: Secondary | ICD-10-CM | POA: Diagnosis not present

## 2020-02-29 DIAGNOSIS — H269 Unspecified cataract: Secondary | ICD-10-CM | POA: Diagnosis not present

## 2020-02-29 DIAGNOSIS — D485 Neoplasm of uncertain behavior of skin: Secondary | ICD-10-CM | POA: Diagnosis not present

## 2020-02-29 DIAGNOSIS — H01001 Unspecified blepharitis right upper eyelid: Secondary | ICD-10-CM | POA: Diagnosis not present

## 2020-02-29 DIAGNOSIS — H47011 Ischemic optic neuropathy, right eye: Secondary | ICD-10-CM | POA: Diagnosis not present

## 2020-02-29 DIAGNOSIS — Z4682 Encounter for fitting and adjustment of non-vascular catheter: Secondary | ICD-10-CM | POA: Diagnosis not present

## 2020-02-29 DIAGNOSIS — M47813 Spondylosis without myelopathy or radiculopathy, cervicothoracic region: Secondary | ICD-10-CM | POA: Diagnosis not present

## 2020-02-29 DIAGNOSIS — J3489 Other specified disorders of nose and nasal sinuses: Secondary | ICD-10-CM | POA: Diagnosis not present

## 2020-02-29 DIAGNOSIS — J40 Bronchitis, not specified as acute or chronic: Secondary | ICD-10-CM | POA: Diagnosis not present

## 2020-02-29 DIAGNOSIS — G44229 Chronic tension-type headache, not intractable: Secondary | ICD-10-CM | POA: Diagnosis not present

## 2020-02-29 DIAGNOSIS — E875 Hyperkalemia: Secondary | ICD-10-CM | POA: Diagnosis not present

## 2020-02-29 DIAGNOSIS — R091 Pleurisy: Secondary | ICD-10-CM | POA: Diagnosis not present

## 2020-02-29 DIAGNOSIS — H5310 Unspecified subjective visual disturbances: Secondary | ICD-10-CM | POA: Diagnosis not present

## 2020-02-29 DIAGNOSIS — K5904 Chronic idiopathic constipation: Secondary | ICD-10-CM | POA: Diagnosis not present

## 2020-02-29 DIAGNOSIS — Z6825 Body mass index (BMI) 25.0-25.9, adult: Secondary | ICD-10-CM | POA: Diagnosis not present

## 2020-02-29 DIAGNOSIS — Z1239 Encounter for other screening for malignant neoplasm of breast: Secondary | ICD-10-CM | POA: Diagnosis not present

## 2020-02-29 DIAGNOSIS — D225 Melanocytic nevi of trunk: Secondary | ICD-10-CM | POA: Diagnosis not present

## 2020-02-29 DIAGNOSIS — I4821 Permanent atrial fibrillation: Secondary | ICD-10-CM | POA: Diagnosis not present

## 2020-02-29 DIAGNOSIS — D638 Anemia in other chronic diseases classified elsewhere: Secondary | ICD-10-CM | POA: Diagnosis not present

## 2020-02-29 DIAGNOSIS — R7303 Prediabetes: Secondary | ICD-10-CM | POA: Diagnosis not present

## 2020-02-29 DIAGNOSIS — R1111 Vomiting without nausea: Secondary | ICD-10-CM | POA: Diagnosis not present

## 2020-02-29 DIAGNOSIS — R0981 Nasal congestion: Secondary | ICD-10-CM | POA: Diagnosis not present

## 2020-02-29 DIAGNOSIS — E611 Iron deficiency: Secondary | ICD-10-CM | POA: Diagnosis not present

## 2020-02-29 DIAGNOSIS — J101 Influenza due to other identified influenza virus with other respiratory manifestations: Secondary | ICD-10-CM | POA: Diagnosis not present

## 2020-02-29 DIAGNOSIS — H18413 Arcus senilis, bilateral: Secondary | ICD-10-CM | POA: Diagnosis not present

## 2020-02-29 DIAGNOSIS — H02831 Dermatochalasis of right upper eyelid: Secondary | ICD-10-CM | POA: Diagnosis not present

## 2020-02-29 DIAGNOSIS — M25661 Stiffness of right knee, not elsewhere classified: Secondary | ICD-10-CM | POA: Diagnosis not present

## 2020-02-29 DIAGNOSIS — I25119 Atherosclerotic heart disease of native coronary artery with unspecified angina pectoris: Secondary | ICD-10-CM | POA: Diagnosis not present

## 2020-02-29 DIAGNOSIS — E7220 Disorder of urea cycle metabolism, unspecified: Secondary | ICD-10-CM | POA: Diagnosis not present

## 2020-02-29 DIAGNOSIS — M419 Scoliosis, unspecified: Secondary | ICD-10-CM | POA: Diagnosis not present

## 2020-02-29 DIAGNOSIS — Z853 Personal history of malignant neoplasm of breast: Secondary | ICD-10-CM | POA: Diagnosis not present

## 2020-02-29 DIAGNOSIS — Z96642 Presence of left artificial hip joint: Secondary | ICD-10-CM | POA: Diagnosis not present

## 2020-02-29 DIAGNOSIS — Z4802 Encounter for removal of sutures: Secondary | ICD-10-CM | POA: Diagnosis not present

## 2020-02-29 DIAGNOSIS — M7550 Bursitis of unspecified shoulder: Secondary | ICD-10-CM | POA: Diagnosis not present

## 2020-02-29 DIAGNOSIS — H259 Unspecified age-related cataract: Secondary | ICD-10-CM | POA: Diagnosis not present

## 2020-02-29 DIAGNOSIS — K5901 Slow transit constipation: Secondary | ICD-10-CM | POA: Diagnosis not present

## 2020-02-29 DIAGNOSIS — Z780 Asymptomatic menopausal state: Secondary | ICD-10-CM | POA: Diagnosis not present

## 2020-02-29 DIAGNOSIS — R194 Change in bowel habit: Secondary | ICD-10-CM | POA: Diagnosis not present

## 2020-02-29 DIAGNOSIS — M9903 Segmental and somatic dysfunction of lumbar region: Secondary | ICD-10-CM | POA: Diagnosis not present

## 2020-02-29 DIAGNOSIS — M25611 Stiffness of right shoulder, not elsewhere classified: Secondary | ICD-10-CM | POA: Diagnosis not present

## 2020-02-29 DIAGNOSIS — F3489 Other specified persistent mood disorders: Secondary | ICD-10-CM | POA: Diagnosis not present

## 2020-02-29 DIAGNOSIS — I10 Essential (primary) hypertension: Secondary | ICD-10-CM | POA: Diagnosis not present

## 2020-02-29 DIAGNOSIS — Z86010 Personal history of colonic polyps: Secondary | ICD-10-CM | POA: Diagnosis not present

## 2020-02-29 DIAGNOSIS — I2699 Other pulmonary embolism without acute cor pulmonale: Secondary | ICD-10-CM | POA: Diagnosis not present

## 2020-02-29 DIAGNOSIS — M25762 Osteophyte, left knee: Secondary | ICD-10-CM | POA: Diagnosis not present

## 2020-02-29 DIAGNOSIS — Z96643 Presence of artificial hip joint, bilateral: Secondary | ICD-10-CM | POA: Diagnosis not present

## 2020-02-29 DIAGNOSIS — D229 Melanocytic nevi, unspecified: Secondary | ICD-10-CM | POA: Diagnosis not present

## 2020-02-29 DIAGNOSIS — Z881 Allergy status to other antibiotic agents status: Secondary | ICD-10-CM | POA: Diagnosis not present

## 2020-02-29 DIAGNOSIS — H02135 Senile ectropion of left lower eyelid: Secondary | ICD-10-CM | POA: Diagnosis not present

## 2020-02-29 DIAGNOSIS — M9905 Segmental and somatic dysfunction of pelvic region: Secondary | ICD-10-CM | POA: Diagnosis not present

## 2020-02-29 DIAGNOSIS — Z8673 Personal history of transient ischemic attack (TIA), and cerebral infarction without residual deficits: Secondary | ICD-10-CM | POA: Diagnosis not present

## 2020-02-29 DIAGNOSIS — K2270 Barrett's esophagus without dysplasia: Secondary | ICD-10-CM | POA: Diagnosis not present

## 2020-02-29 DIAGNOSIS — D45 Polycythemia vera: Secondary | ICD-10-CM | POA: Diagnosis not present

## 2020-02-29 DIAGNOSIS — I519 Heart disease, unspecified: Secondary | ICD-10-CM | POA: Diagnosis not present

## 2020-02-29 DIAGNOSIS — G5600 Carpal tunnel syndrome, unspecified upper limb: Secondary | ICD-10-CM | POA: Diagnosis not present

## 2020-02-29 DIAGNOSIS — M5413 Radiculopathy, cervicothoracic region: Secondary | ICD-10-CM | POA: Diagnosis not present

## 2020-02-29 DIAGNOSIS — S81802A Unspecified open wound, left lower leg, initial encounter: Secondary | ICD-10-CM | POA: Diagnosis not present

## 2020-02-29 DIAGNOSIS — I82402 Acute embolism and thrombosis of unspecified deep veins of left lower extremity: Secondary | ICD-10-CM | POA: Diagnosis not present

## 2020-02-29 DIAGNOSIS — Z86718 Personal history of other venous thrombosis and embolism: Secondary | ICD-10-CM | POA: Diagnosis not present

## 2020-02-29 DIAGNOSIS — M8738 Other secondary osteonecrosis, other site: Secondary | ICD-10-CM | POA: Diagnosis not present

## 2020-02-29 DIAGNOSIS — N8189 Other female genital prolapse: Secondary | ICD-10-CM | POA: Diagnosis not present

## 2020-02-29 DIAGNOSIS — M25641 Stiffness of right hand, not elsewhere classified: Secondary | ICD-10-CM | POA: Diagnosis not present

## 2020-02-29 DIAGNOSIS — I150 Renovascular hypertension: Secondary | ICD-10-CM | POA: Diagnosis not present

## 2020-02-29 DIAGNOSIS — G5603 Carpal tunnel syndrome, bilateral upper limbs: Secondary | ICD-10-CM | POA: Diagnosis not present

## 2020-02-29 DIAGNOSIS — R569 Unspecified convulsions: Secondary | ICD-10-CM | POA: Diagnosis not present

## 2020-02-29 DIAGNOSIS — Z992 Dependence on renal dialysis: Secondary | ICD-10-CM | POA: Diagnosis not present

## 2020-02-29 DIAGNOSIS — M7502 Adhesive capsulitis of left shoulder: Secondary | ICD-10-CM | POA: Diagnosis not present

## 2020-02-29 DIAGNOSIS — I6300 Cerebral infarction due to thrombosis of unspecified precerebral artery: Secondary | ICD-10-CM | POA: Diagnosis not present

## 2020-02-29 DIAGNOSIS — H25811 Combined forms of age-related cataract, right eye: Secondary | ICD-10-CM | POA: Diagnosis not present

## 2020-02-29 DIAGNOSIS — M47892 Other spondylosis, cervical region: Secondary | ICD-10-CM | POA: Diagnosis not present

## 2020-02-29 DIAGNOSIS — M150 Primary generalized (osteo)arthritis: Secondary | ICD-10-CM | POA: Diagnosis not present

## 2020-02-29 DIAGNOSIS — E1121 Type 2 diabetes mellitus with diabetic nephropathy: Secondary | ICD-10-CM | POA: Diagnosis not present

## 2020-02-29 DIAGNOSIS — N130 Hydronephrosis with ureteropelvic junction obstruction: Secondary | ICD-10-CM | POA: Diagnosis not present

## 2020-02-29 DIAGNOSIS — Q761 Klippel-Feil syndrome: Secondary | ICD-10-CM | POA: Diagnosis not present

## 2020-02-29 DIAGNOSIS — Z4789 Encounter for other orthopedic aftercare: Secondary | ICD-10-CM | POA: Diagnosis not present

## 2020-02-29 DIAGNOSIS — D2262 Melanocytic nevi of left upper limb, including shoulder: Secondary | ICD-10-CM | POA: Diagnosis not present

## 2020-02-29 DIAGNOSIS — Z452 Encounter for adjustment and management of vascular access device: Secondary | ICD-10-CM | POA: Diagnosis not present

## 2020-02-29 DIAGNOSIS — Z8679 Personal history of other diseases of the circulatory system: Secondary | ICD-10-CM | POA: Diagnosis not present

## 2020-02-29 DIAGNOSIS — C3491 Malignant neoplasm of unspecified part of right bronchus or lung: Secondary | ICD-10-CM | POA: Diagnosis not present

## 2020-02-29 DIAGNOSIS — M47896 Other spondylosis, lumbar region: Secondary | ICD-10-CM | POA: Diagnosis not present

## 2020-02-29 DIAGNOSIS — Z5181 Encounter for therapeutic drug level monitoring: Secondary | ICD-10-CM | POA: Diagnosis not present

## 2020-02-29 DIAGNOSIS — T8521XA Breakdown (mechanical) of intraocular lens, initial encounter: Secondary | ICD-10-CM | POA: Diagnosis not present

## 2020-02-29 DIAGNOSIS — R161 Splenomegaly, not elsewhere classified: Secondary | ICD-10-CM | POA: Diagnosis not present

## 2020-02-29 DIAGNOSIS — H353132 Nonexudative age-related macular degeneration, bilateral, intermediate dry stage: Secondary | ICD-10-CM | POA: Diagnosis not present

## 2020-02-29 DIAGNOSIS — H35431 Paving stone degeneration of retina, right eye: Secondary | ICD-10-CM | POA: Diagnosis not present

## 2020-02-29 DIAGNOSIS — L308 Other specified dermatitis: Secondary | ICD-10-CM | POA: Diagnosis not present

## 2020-02-29 DIAGNOSIS — K922 Gastrointestinal hemorrhage, unspecified: Secondary | ICD-10-CM | POA: Diagnosis not present

## 2020-02-29 DIAGNOSIS — E1022 Type 1 diabetes mellitus with diabetic chronic kidney disease: Secondary | ICD-10-CM | POA: Diagnosis not present

## 2020-02-29 DIAGNOSIS — G4734 Idiopathic sleep related nonobstructive alveolar hypoventilation: Secondary | ICD-10-CM | POA: Diagnosis not present

## 2020-02-29 DIAGNOSIS — M79674 Pain in right toe(s): Secondary | ICD-10-CM | POA: Diagnosis not present

## 2020-02-29 DIAGNOSIS — S62336A Displaced fracture of neck of fifth metacarpal bone, right hand, initial encounter for closed fracture: Secondary | ICD-10-CM | POA: Diagnosis not present

## 2020-02-29 DIAGNOSIS — G4739 Other sleep apnea: Secondary | ICD-10-CM | POA: Diagnosis not present

## 2020-02-29 DIAGNOSIS — Z79891 Long term (current) use of opiate analgesic: Secondary | ICD-10-CM | POA: Diagnosis not present

## 2020-02-29 DIAGNOSIS — D72819 Decreased white blood cell count, unspecified: Secondary | ICD-10-CM | POA: Diagnosis not present

## 2020-02-29 DIAGNOSIS — R3129 Other microscopic hematuria: Secondary | ICD-10-CM | POA: Diagnosis not present

## 2020-02-29 DIAGNOSIS — H401133 Primary open-angle glaucoma, bilateral, severe stage: Secondary | ICD-10-CM | POA: Diagnosis not present

## 2020-02-29 DIAGNOSIS — I7025 Atherosclerosis of native arteries of other extremities with ulceration: Secondary | ICD-10-CM | POA: Diagnosis not present

## 2020-02-29 DIAGNOSIS — K31819 Angiodysplasia of stomach and duodenum without bleeding: Secondary | ICD-10-CM | POA: Diagnosis not present

## 2020-02-29 DIAGNOSIS — R32 Unspecified urinary incontinence: Secondary | ICD-10-CM | POA: Diagnosis not present

## 2020-02-29 DIAGNOSIS — K644 Residual hemorrhoidal skin tags: Secondary | ICD-10-CM | POA: Diagnosis not present

## 2020-02-29 DIAGNOSIS — H9042 Sensorineural hearing loss, unilateral, left ear, with unrestricted hearing on the contralateral side: Secondary | ICD-10-CM | POA: Diagnosis not present

## 2020-02-29 DIAGNOSIS — G8220 Paraplegia, unspecified: Secondary | ICD-10-CM | POA: Diagnosis not present

## 2020-02-29 DIAGNOSIS — Z96659 Presence of unspecified artificial knee joint: Secondary | ICD-10-CM | POA: Diagnosis not present

## 2020-02-29 DIAGNOSIS — S62306A Unspecified fracture of fifth metacarpal bone, right hand, initial encounter for closed fracture: Secondary | ICD-10-CM | POA: Diagnosis not present

## 2020-02-29 DIAGNOSIS — Z953 Presence of xenogenic heart valve: Secondary | ICD-10-CM | POA: Diagnosis not present

## 2020-02-29 DIAGNOSIS — T22299S Burn of second degree of multiple sites of unspecified shoulder and upper limb, except wrist and hand, sequela: Secondary | ICD-10-CM | POA: Diagnosis not present

## 2020-02-29 DIAGNOSIS — M25862 Other specified joint disorders, left knee: Secondary | ICD-10-CM | POA: Diagnosis not present

## 2020-02-29 DIAGNOSIS — H40003 Preglaucoma, unspecified, bilateral: Secondary | ICD-10-CM | POA: Diagnosis not present

## 2020-02-29 DIAGNOSIS — M7072 Other bursitis of hip, left hip: Secondary | ICD-10-CM | POA: Diagnosis not present

## 2020-02-29 DIAGNOSIS — T50Z95A Adverse effect of other vaccines and biological substances, initial encounter: Secondary | ICD-10-CM | POA: Diagnosis not present

## 2020-02-29 DIAGNOSIS — M8949 Other hypertrophic osteoarthropathy, multiple sites: Secondary | ICD-10-CM | POA: Diagnosis not present

## 2020-02-29 DIAGNOSIS — M818 Other osteoporosis without current pathological fracture: Secondary | ICD-10-CM | POA: Diagnosis not present

## 2020-02-29 DIAGNOSIS — J4521 Mild intermittent asthma with (acute) exacerbation: Secondary | ICD-10-CM | POA: Diagnosis not present

## 2020-02-29 DIAGNOSIS — Z955 Presence of coronary angioplasty implant and graft: Secondary | ICD-10-CM | POA: Diagnosis not present

## 2020-02-29 DIAGNOSIS — H5213 Myopia, bilateral: Secondary | ICD-10-CM | POA: Diagnosis not present

## 2020-02-29 DIAGNOSIS — G2401 Drug induced subacute dyskinesia: Secondary | ICD-10-CM | POA: Diagnosis not present

## 2020-02-29 DIAGNOSIS — M19042 Primary osteoarthritis, left hand: Secondary | ICD-10-CM | POA: Diagnosis not present

## 2020-02-29 DIAGNOSIS — B009 Herpesviral infection, unspecified: Secondary | ICD-10-CM | POA: Diagnosis not present

## 2020-02-29 DIAGNOSIS — G309 Alzheimer's disease, unspecified: Secondary | ICD-10-CM | POA: Diagnosis not present

## 2020-02-29 DIAGNOSIS — R739 Hyperglycemia, unspecified: Secondary | ICD-10-CM | POA: Diagnosis not present

## 2020-02-29 DIAGNOSIS — Z86018 Personal history of other benign neoplasm: Secondary | ICD-10-CM | POA: Diagnosis not present

## 2020-02-29 DIAGNOSIS — D1802 Hemangioma of intracranial structures: Secondary | ICD-10-CM | POA: Diagnosis not present

## 2020-02-29 DIAGNOSIS — M2560 Stiffness of unspecified joint, not elsewhere classified: Secondary | ICD-10-CM | POA: Diagnosis not present

## 2020-02-29 DIAGNOSIS — Q600 Renal agenesis, unilateral: Secondary | ICD-10-CM | POA: Diagnosis not present

## 2020-02-29 DIAGNOSIS — L0291 Cutaneous abscess, unspecified: Secondary | ICD-10-CM | POA: Diagnosis not present

## 2020-02-29 DIAGNOSIS — M1712 Unilateral primary osteoarthritis, left knee: Secondary | ICD-10-CM | POA: Diagnosis not present

## 2020-02-29 DIAGNOSIS — M2142 Flat foot [pes planus] (acquired), left foot: Secondary | ICD-10-CM | POA: Diagnosis not present

## 2020-02-29 DIAGNOSIS — F17200 Nicotine dependence, unspecified, uncomplicated: Secondary | ICD-10-CM | POA: Diagnosis not present

## 2020-02-29 DIAGNOSIS — S76019D Strain of muscle, fascia and tendon of unspecified hip, subsequent encounter: Secondary | ICD-10-CM | POA: Diagnosis not present

## 2020-02-29 DIAGNOSIS — I8312 Varicose veins of left lower extremity with inflammation: Secondary | ICD-10-CM | POA: Diagnosis not present

## 2020-02-29 DIAGNOSIS — I5189 Other ill-defined heart diseases: Secondary | ICD-10-CM | POA: Diagnosis not present

## 2020-02-29 DIAGNOSIS — S2242XA Multiple fractures of ribs, left side, initial encounter for closed fracture: Secondary | ICD-10-CM | POA: Diagnosis not present

## 2020-02-29 DIAGNOSIS — C9210 Chronic myeloid leukemia, BCR/ABL-positive, not having achieved remission: Secondary | ICD-10-CM | POA: Diagnosis not present

## 2020-02-29 DIAGNOSIS — M5432 Sciatica, left side: Secondary | ICD-10-CM | POA: Diagnosis not present

## 2020-02-29 DIAGNOSIS — I63411 Cerebral infarction due to embolism of right middle cerebral artery: Secondary | ICD-10-CM | POA: Diagnosis not present

## 2020-02-29 DIAGNOSIS — L650 Telogen effluvium: Secondary | ICD-10-CM | POA: Diagnosis not present

## 2020-02-29 DIAGNOSIS — H353130 Nonexudative age-related macular degeneration, bilateral, stage unspecified: Secondary | ICD-10-CM | POA: Diagnosis not present

## 2020-02-29 DIAGNOSIS — I4901 Ventricular fibrillation: Secondary | ICD-10-CM | POA: Diagnosis not present

## 2020-02-29 DIAGNOSIS — J479 Bronchiectasis, uncomplicated: Secondary | ICD-10-CM | POA: Diagnosis not present

## 2020-02-29 DIAGNOSIS — M5442 Lumbago with sciatica, left side: Secondary | ICD-10-CM | POA: Diagnosis not present

## 2020-02-29 DIAGNOSIS — H353121 Nonexudative age-related macular degeneration, left eye, early dry stage: Secondary | ICD-10-CM | POA: Diagnosis not present

## 2020-02-29 DIAGNOSIS — Z90710 Acquired absence of both cervix and uterus: Secondary | ICD-10-CM | POA: Diagnosis not present

## 2020-02-29 DIAGNOSIS — R2681 Unsteadiness on feet: Secondary | ICD-10-CM | POA: Diagnosis not present

## 2020-02-29 DIAGNOSIS — F5221 Male erectile disorder: Secondary | ICD-10-CM | POA: Diagnosis not present

## 2020-02-29 DIAGNOSIS — K810 Acute cholecystitis: Secondary | ICD-10-CM | POA: Diagnosis not present

## 2020-02-29 DIAGNOSIS — M180 Bilateral primary osteoarthritis of first carpometacarpal joints: Secondary | ICD-10-CM | POA: Diagnosis not present

## 2020-02-29 DIAGNOSIS — E113313 Type 2 diabetes mellitus with moderate nonproliferative diabetic retinopathy with macular edema, bilateral: Secondary | ICD-10-CM | POA: Diagnosis not present

## 2020-02-29 DIAGNOSIS — M2141 Flat foot [pes planus] (acquired), right foot: Secondary | ICD-10-CM | POA: Diagnosis not present

## 2020-02-29 DIAGNOSIS — Z794 Long term (current) use of insulin: Secondary | ICD-10-CM | POA: Diagnosis not present

## 2020-02-29 DIAGNOSIS — E7849 Other hyperlipidemia: Secondary | ICD-10-CM | POA: Diagnosis not present

## 2020-02-29 DIAGNOSIS — Z952 Presence of prosthetic heart valve: Secondary | ICD-10-CM | POA: Diagnosis not present

## 2020-02-29 DIAGNOSIS — S41101D Unspecified open wound of right upper arm, subsequent encounter: Secondary | ICD-10-CM | POA: Diagnosis not present

## 2020-02-29 DIAGNOSIS — R079 Chest pain, unspecified: Secondary | ICD-10-CM | POA: Diagnosis not present

## 2020-02-29 DIAGNOSIS — G119 Hereditary ataxia, unspecified: Secondary | ICD-10-CM | POA: Diagnosis not present

## 2020-02-29 DIAGNOSIS — H348111 Central retinal vein occlusion, right eye, with retinal neovascularization: Secondary | ICD-10-CM | POA: Diagnosis not present

## 2020-02-29 DIAGNOSIS — B372 Candidiasis of skin and nail: Secondary | ICD-10-CM | POA: Diagnosis not present

## 2020-02-29 DIAGNOSIS — E119 Type 2 diabetes mellitus without complications: Secondary | ICD-10-CM | POA: Diagnosis not present

## 2020-02-29 DIAGNOSIS — I771 Stricture of artery: Secondary | ICD-10-CM | POA: Diagnosis not present

## 2020-02-29 DIAGNOSIS — Z471 Aftercare following joint replacement surgery: Secondary | ICD-10-CM | POA: Diagnosis not present

## 2020-02-29 DIAGNOSIS — Z483 Aftercare following surgery for neoplasm: Secondary | ICD-10-CM | POA: Diagnosis not present

## 2020-02-29 DIAGNOSIS — M2061 Acquired deformities of toe(s), unspecified, right foot: Secondary | ICD-10-CM | POA: Diagnosis not present

## 2020-02-29 DIAGNOSIS — Z800 Family history of malignant neoplasm of digestive organs: Secondary | ICD-10-CM | POA: Diagnosis not present

## 2020-02-29 DIAGNOSIS — S93421A Sprain of deltoid ligament of right ankle, initial encounter: Secondary | ICD-10-CM | POA: Diagnosis not present

## 2020-02-29 DIAGNOSIS — R338 Other retention of urine: Secondary | ICD-10-CM | POA: Diagnosis not present

## 2020-02-29 DIAGNOSIS — D751 Secondary polycythemia: Secondary | ICD-10-CM | POA: Diagnosis not present

## 2020-02-29 DIAGNOSIS — E860 Dehydration: Secondary | ICD-10-CM | POA: Diagnosis not present

## 2020-02-29 DIAGNOSIS — N183 Chronic kidney disease, stage 3 unspecified: Secondary | ICD-10-CM | POA: Diagnosis not present

## 2020-02-29 DIAGNOSIS — M25519 Pain in unspecified shoulder: Secondary | ICD-10-CM | POA: Diagnosis not present

## 2020-02-29 DIAGNOSIS — Z833 Family history of diabetes mellitus: Secondary | ICD-10-CM | POA: Diagnosis not present

## 2020-02-29 DIAGNOSIS — H3322 Serous retinal detachment, left eye: Secondary | ICD-10-CM | POA: Diagnosis not present

## 2020-02-29 DIAGNOSIS — S2341XA Sprain of ribs, initial encounter: Secondary | ICD-10-CM | POA: Diagnosis not present

## 2020-02-29 DIAGNOSIS — C187 Malignant neoplasm of sigmoid colon: Secondary | ICD-10-CM | POA: Diagnosis not present

## 2020-02-29 DIAGNOSIS — C8510 Unspecified B-cell lymphoma, unspecified site: Secondary | ICD-10-CM | POA: Diagnosis not present

## 2020-02-29 DIAGNOSIS — L97522 Non-pressure chronic ulcer of other part of left foot with fat layer exposed: Secondary | ICD-10-CM | POA: Diagnosis not present

## 2020-02-29 DIAGNOSIS — N3946 Mixed incontinence: Secondary | ICD-10-CM | POA: Diagnosis not present

## 2020-02-29 DIAGNOSIS — Z79899 Other long term (current) drug therapy: Secondary | ICD-10-CM | POA: Diagnosis not present

## 2020-02-29 DIAGNOSIS — Q828 Other specified congenital malformations of skin: Secondary | ICD-10-CM | POA: Diagnosis not present

## 2020-02-29 DIAGNOSIS — M79671 Pain in right foot: Secondary | ICD-10-CM | POA: Diagnosis not present

## 2020-02-29 DIAGNOSIS — M5412 Radiculopathy, cervical region: Secondary | ICD-10-CM | POA: Diagnosis not present

## 2020-02-29 DIAGNOSIS — H8110 Benign paroxysmal vertigo, unspecified ear: Secondary | ICD-10-CM | POA: Diagnosis not present

## 2020-02-29 DIAGNOSIS — M25561 Pain in right knee: Secondary | ICD-10-CM | POA: Diagnosis not present

## 2020-02-29 DIAGNOSIS — M0609 Rheumatoid arthritis without rheumatoid factor, multiple sites: Secondary | ICD-10-CM | POA: Diagnosis not present

## 2020-02-29 DIAGNOSIS — M961 Postlaminectomy syndrome, not elsewhere classified: Secondary | ICD-10-CM | POA: Diagnosis not present

## 2020-02-29 DIAGNOSIS — K921 Melena: Secondary | ICD-10-CM | POA: Diagnosis not present

## 2020-02-29 DIAGNOSIS — J1282 Pneumonia due to coronavirus disease 2019: Secondary | ICD-10-CM | POA: Diagnosis not present

## 2020-02-29 DIAGNOSIS — H401113 Primary open-angle glaucoma, right eye, severe stage: Secondary | ICD-10-CM | POA: Diagnosis not present

## 2020-02-29 DIAGNOSIS — M722 Plantar fascial fibromatosis: Secondary | ICD-10-CM | POA: Diagnosis not present

## 2020-02-29 DIAGNOSIS — N182 Chronic kidney disease, stage 2 (mild): Secondary | ICD-10-CM | POA: Diagnosis not present

## 2020-02-29 DIAGNOSIS — H918X3 Other specified hearing loss, bilateral: Secondary | ICD-10-CM | POA: Diagnosis not present

## 2020-02-29 DIAGNOSIS — I82562 Chronic embolism and thrombosis of left calf muscular vein: Secondary | ICD-10-CM | POA: Diagnosis not present

## 2020-02-29 DIAGNOSIS — I6340 Cerebral infarction due to embolism of unspecified cerebral artery: Secondary | ICD-10-CM | POA: Diagnosis not present

## 2020-02-29 DIAGNOSIS — Z299 Encounter for prophylactic measures, unspecified: Secondary | ICD-10-CM | POA: Diagnosis not present

## 2020-02-29 DIAGNOSIS — Z801 Family history of malignant neoplasm of trachea, bronchus and lung: Secondary | ICD-10-CM | POA: Diagnosis not present

## 2020-02-29 DIAGNOSIS — S72042D Displaced fracture of base of neck of left femur, subsequent encounter for closed fracture with routine healing: Secondary | ICD-10-CM | POA: Diagnosis not present

## 2020-02-29 DIAGNOSIS — G4440 Drug-induced headache, not elsewhere classified, not intractable: Secondary | ICD-10-CM | POA: Diagnosis not present

## 2020-02-29 DIAGNOSIS — F411 Generalized anxiety disorder: Secondary | ICD-10-CM | POA: Diagnosis not present

## 2020-02-29 DIAGNOSIS — H1013 Acute atopic conjunctivitis, bilateral: Secondary | ICD-10-CM | POA: Diagnosis not present

## 2020-02-29 DIAGNOSIS — I5042 Chronic combined systolic (congestive) and diastolic (congestive) heart failure: Secondary | ICD-10-CM | POA: Diagnosis not present

## 2020-02-29 DIAGNOSIS — J0110 Acute frontal sinusitis, unspecified: Secondary | ICD-10-CM | POA: Diagnosis not present

## 2020-02-29 DIAGNOSIS — C7951 Secondary malignant neoplasm of bone: Secondary | ICD-10-CM | POA: Diagnosis not present

## 2020-02-29 DIAGNOSIS — N185 Chronic kidney disease, stage 5: Secondary | ICD-10-CM | POA: Diagnosis not present

## 2020-02-29 DIAGNOSIS — Z87891 Personal history of nicotine dependence: Secondary | ICD-10-CM | POA: Diagnosis not present

## 2020-02-29 DIAGNOSIS — Z125 Encounter for screening for malignant neoplasm of prostate: Secondary | ICD-10-CM | POA: Diagnosis not present

## 2020-02-29 DIAGNOSIS — S27321A Contusion of lung, unilateral, initial encounter: Secondary | ICD-10-CM | POA: Diagnosis not present

## 2020-02-29 DIAGNOSIS — H16211 Exposure keratoconjunctivitis, right eye: Secondary | ICD-10-CM | POA: Diagnosis not present

## 2020-02-29 DIAGNOSIS — E7801 Familial hypercholesterolemia: Secondary | ICD-10-CM | POA: Diagnosis not present

## 2020-02-29 DIAGNOSIS — K1121 Acute sialoadenitis: Secondary | ICD-10-CM | POA: Diagnosis not present

## 2020-02-29 DIAGNOSIS — R918 Other nonspecific abnormal finding of lung field: Secondary | ICD-10-CM | POA: Diagnosis not present

## 2020-02-29 DIAGNOSIS — R001 Bradycardia, unspecified: Secondary | ICD-10-CM | POA: Diagnosis not present

## 2020-02-29 DIAGNOSIS — F17220 Nicotine dependence, chewing tobacco, uncomplicated: Secondary | ICD-10-CM | POA: Diagnosis not present

## 2020-02-29 DIAGNOSIS — H40052 Ocular hypertension, left eye: Secondary | ICD-10-CM | POA: Diagnosis not present

## 2020-02-29 DIAGNOSIS — E1169 Type 2 diabetes mellitus with other specified complication: Secondary | ICD-10-CM | POA: Diagnosis not present

## 2020-02-29 DIAGNOSIS — F3342 Major depressive disorder, recurrent, in full remission: Secondary | ICD-10-CM | POA: Diagnosis not present

## 2020-02-29 DIAGNOSIS — I959 Hypotension, unspecified: Secondary | ICD-10-CM | POA: Diagnosis not present

## 2020-02-29 DIAGNOSIS — M069 Rheumatoid arthritis, unspecified: Secondary | ICD-10-CM | POA: Diagnosis not present

## 2020-02-29 DIAGNOSIS — H2512 Age-related nuclear cataract, left eye: Secondary | ICD-10-CM | POA: Diagnosis not present

## 2020-02-29 DIAGNOSIS — Z6834 Body mass index (BMI) 34.0-34.9, adult: Secondary | ICD-10-CM | POA: Diagnosis not present

## 2020-02-29 DIAGNOSIS — J208 Acute bronchitis due to other specified organisms: Secondary | ICD-10-CM | POA: Diagnosis not present

## 2020-02-29 DIAGNOSIS — D62 Acute posthemorrhagic anemia: Secondary | ICD-10-CM | POA: Diagnosis not present

## 2020-02-29 DIAGNOSIS — Z8589 Personal history of malignant neoplasm of other organs and systems: Secondary | ICD-10-CM | POA: Diagnosis not present

## 2020-02-29 DIAGNOSIS — I776 Arteritis, unspecified: Secondary | ICD-10-CM | POA: Diagnosis not present

## 2020-02-29 DIAGNOSIS — Z006 Encounter for examination for normal comparison and control in clinical research program: Secondary | ICD-10-CM | POA: Diagnosis not present

## 2020-02-29 DIAGNOSIS — F320 Major depressive disorder, single episode, mild: Secondary | ICD-10-CM | POA: Diagnosis not present

## 2020-02-29 DIAGNOSIS — D649 Anemia, unspecified: Secondary | ICD-10-CM | POA: Diagnosis not present

## 2020-02-29 DIAGNOSIS — Z932 Ileostomy status: Secondary | ICD-10-CM | POA: Diagnosis not present

## 2020-02-29 DIAGNOSIS — I8393 Asymptomatic varicose veins of bilateral lower extremities: Secondary | ICD-10-CM | POA: Diagnosis not present

## 2020-02-29 DIAGNOSIS — C689 Malignant neoplasm of urinary organ, unspecified: Secondary | ICD-10-CM | POA: Diagnosis not present

## 2020-02-29 DIAGNOSIS — F439 Reaction to severe stress, unspecified: Secondary | ICD-10-CM | POA: Diagnosis not present

## 2020-02-29 DIAGNOSIS — Z86711 Personal history of pulmonary embolism: Secondary | ICD-10-CM | POA: Diagnosis not present

## 2020-02-29 DIAGNOSIS — D3615 Benign neoplasm of peripheral nerves and autonomic nervous system of abdomen: Secondary | ICD-10-CM | POA: Diagnosis not present

## 2020-02-29 DIAGNOSIS — Z6837 Body mass index (BMI) 37.0-37.9, adult: Secondary | ICD-10-CM | POA: Diagnosis not present

## 2020-02-29 DIAGNOSIS — K5130 Ulcerative (chronic) rectosigmoiditis without complications: Secondary | ICD-10-CM | POA: Diagnosis not present

## 2020-02-29 DIAGNOSIS — Z951 Presence of aortocoronary bypass graft: Secondary | ICD-10-CM | POA: Diagnosis not present

## 2020-02-29 DIAGNOSIS — H5202 Hypermetropia, left eye: Secondary | ICD-10-CM | POA: Diagnosis not present

## 2020-02-29 DIAGNOSIS — C3412 Malignant neoplasm of upper lobe, left bronchus or lung: Secondary | ICD-10-CM | POA: Diagnosis not present

## 2020-02-29 DIAGNOSIS — K219 Gastro-esophageal reflux disease without esophagitis: Secondary | ICD-10-CM | POA: Diagnosis not present

## 2020-02-29 DIAGNOSIS — E872 Acidosis: Secondary | ICD-10-CM | POA: Diagnosis not present

## 2020-02-29 DIAGNOSIS — K625 Hemorrhage of anus and rectum: Secondary | ICD-10-CM | POA: Diagnosis not present

## 2020-02-29 DIAGNOSIS — J300 Vasomotor rhinitis: Secondary | ICD-10-CM | POA: Diagnosis not present

## 2020-02-29 DIAGNOSIS — M6281 Muscle weakness (generalized): Secondary | ICD-10-CM | POA: Diagnosis not present

## 2020-02-29 DIAGNOSIS — E0590 Thyrotoxicosis, unspecified without thyrotoxic crisis or storm: Secondary | ICD-10-CM | POA: Diagnosis not present

## 2020-02-29 DIAGNOSIS — I471 Supraventricular tachycardia: Secondary | ICD-10-CM | POA: Diagnosis not present

## 2020-03-01 DIAGNOSIS — K5720 Diverticulitis of large intestine with perforation and abscess without bleeding: Secondary | ICD-10-CM | POA: Diagnosis not present

## 2020-03-01 DIAGNOSIS — T8484XA Pain due to internal orthopedic prosthetic devices, implants and grafts, initial encounter: Secondary | ICD-10-CM | POA: Diagnosis not present

## 2020-03-01 DIAGNOSIS — S52514D Nondisplaced fracture of right radial styloid process, subsequent encounter for closed fracture with routine healing: Secondary | ICD-10-CM | POA: Diagnosis not present

## 2020-03-01 DIAGNOSIS — K4090 Unilateral inguinal hernia, without obstruction or gangrene, not specified as recurrent: Secondary | ICD-10-CM | POA: Diagnosis not present

## 2020-03-01 DIAGNOSIS — Z1283 Encounter for screening for malignant neoplasm of skin: Secondary | ICD-10-CM | POA: Diagnosis not present

## 2020-03-01 DIAGNOSIS — H52203 Unspecified astigmatism, bilateral: Secondary | ICD-10-CM | POA: Diagnosis not present

## 2020-03-01 DIAGNOSIS — M8580 Other specified disorders of bone density and structure, unspecified site: Secondary | ICD-10-CM | POA: Diagnosis not present

## 2020-03-01 DIAGNOSIS — M5033 Other cervical disc degeneration, cervicothoracic region: Secondary | ICD-10-CM | POA: Diagnosis not present

## 2020-03-01 DIAGNOSIS — G4719 Other hypersomnia: Secondary | ICD-10-CM | POA: Diagnosis not present

## 2020-03-01 DIAGNOSIS — G4710 Hypersomnia, unspecified: Secondary | ICD-10-CM | POA: Diagnosis not present

## 2020-03-01 DIAGNOSIS — I69318 Other symptoms and signs involving cognitive functions following cerebral infarction: Secondary | ICD-10-CM | POA: Diagnosis not present

## 2020-03-01 DIAGNOSIS — W57XXXA Bitten or stung by nonvenomous insect and other nonvenomous arthropods, initial encounter: Secondary | ICD-10-CM | POA: Diagnosis not present

## 2020-03-01 DIAGNOSIS — E8881 Metabolic syndrome: Secondary | ICD-10-CM | POA: Diagnosis not present

## 2020-03-01 DIAGNOSIS — I472 Ventricular tachycardia: Secondary | ICD-10-CM | POA: Diagnosis not present

## 2020-03-01 DIAGNOSIS — E861 Hypovolemia: Secondary | ICD-10-CM | POA: Diagnosis not present

## 2020-03-01 DIAGNOSIS — K9186 Retained cholelithiasis following cholecystectomy: Secondary | ICD-10-CM | POA: Diagnosis not present

## 2020-03-01 DIAGNOSIS — M7061 Trochanteric bursitis, right hip: Secondary | ICD-10-CM | POA: Diagnosis not present

## 2020-03-01 DIAGNOSIS — R8290 Unspecified abnormal findings in urine: Secondary | ICD-10-CM | POA: Diagnosis not present

## 2020-03-01 DIAGNOSIS — Z85528 Personal history of other malignant neoplasm of kidney: Secondary | ICD-10-CM | POA: Diagnosis not present

## 2020-03-01 DIAGNOSIS — Z9109 Other allergy status, other than to drugs and biological substances: Secondary | ICD-10-CM | POA: Diagnosis not present

## 2020-03-01 DIAGNOSIS — M86671 Other chronic osteomyelitis, right ankle and foot: Secondary | ICD-10-CM | POA: Diagnosis not present

## 2020-03-01 DIAGNOSIS — R4189 Other symptoms and signs involving cognitive functions and awareness: Secondary | ICD-10-CM | POA: Diagnosis not present

## 2020-03-01 DIAGNOSIS — M8588 Other specified disorders of bone density and structure, other site: Secondary | ICD-10-CM | POA: Diagnosis not present

## 2020-03-01 DIAGNOSIS — H02834 Dermatochalasis of left upper eyelid: Secondary | ICD-10-CM | POA: Diagnosis not present

## 2020-03-01 DIAGNOSIS — K862 Cyst of pancreas: Secondary | ICD-10-CM | POA: Diagnosis not present

## 2020-03-01 DIAGNOSIS — Z79899 Other long term (current) drug therapy: Secondary | ICD-10-CM | POA: Diagnosis not present

## 2020-03-01 DIAGNOSIS — E889 Metabolic disorder, unspecified: Secondary | ICD-10-CM | POA: Diagnosis not present

## 2020-03-01 DIAGNOSIS — M79605 Pain in left leg: Secondary | ICD-10-CM | POA: Diagnosis not present

## 2020-03-01 DIAGNOSIS — G5601 Carpal tunnel syndrome, right upper limb: Secondary | ICD-10-CM | POA: Diagnosis not present

## 2020-03-01 DIAGNOSIS — E6609 Other obesity due to excess calories: Secondary | ICD-10-CM | POA: Diagnosis not present

## 2020-03-01 DIAGNOSIS — H04123 Dry eye syndrome of bilateral lacrimal glands: Secondary | ICD-10-CM | POA: Diagnosis not present

## 2020-03-01 DIAGNOSIS — Z87440 Personal history of urinary (tract) infections: Secondary | ICD-10-CM | POA: Diagnosis not present

## 2020-03-01 DIAGNOSIS — M7542 Impingement syndrome of left shoulder: Secondary | ICD-10-CM | POA: Diagnosis not present

## 2020-03-01 DIAGNOSIS — H401112 Primary open-angle glaucoma, right eye, moderate stage: Secondary | ICD-10-CM | POA: Diagnosis not present

## 2020-03-01 DIAGNOSIS — H353112 Nonexudative age-related macular degeneration, right eye, intermediate dry stage: Secondary | ICD-10-CM | POA: Diagnosis not present

## 2020-03-01 DIAGNOSIS — M159 Polyosteoarthritis, unspecified: Secondary | ICD-10-CM | POA: Diagnosis not present

## 2020-03-01 DIAGNOSIS — M2032 Hallux varus (acquired), left foot: Secondary | ICD-10-CM | POA: Diagnosis not present

## 2020-03-01 DIAGNOSIS — F329 Major depressive disorder, single episode, unspecified: Secondary | ICD-10-CM | POA: Diagnosis not present

## 2020-03-01 DIAGNOSIS — Z9221 Personal history of antineoplastic chemotherapy: Secondary | ICD-10-CM | POA: Diagnosis not present

## 2020-03-01 DIAGNOSIS — L0291 Cutaneous abscess, unspecified: Secondary | ICD-10-CM | POA: Diagnosis not present

## 2020-03-01 DIAGNOSIS — M79672 Pain in left foot: Secondary | ICD-10-CM | POA: Diagnosis not present

## 2020-03-01 DIAGNOSIS — E271 Primary adrenocortical insufficiency: Secondary | ICD-10-CM | POA: Diagnosis not present

## 2020-03-01 DIAGNOSIS — Z6836 Body mass index (BMI) 36.0-36.9, adult: Secondary | ICD-10-CM | POA: Diagnosis not present

## 2020-03-01 DIAGNOSIS — E211 Secondary hyperparathyroidism, not elsewhere classified: Secondary | ICD-10-CM | POA: Diagnosis not present

## 2020-03-01 DIAGNOSIS — K8020 Calculus of gallbladder without cholecystitis without obstruction: Secondary | ICD-10-CM | POA: Diagnosis not present

## 2020-03-01 DIAGNOSIS — K7460 Unspecified cirrhosis of liver: Secondary | ICD-10-CM | POA: Diagnosis not present

## 2020-03-01 DIAGNOSIS — K5730 Diverticulosis of large intestine without perforation or abscess without bleeding: Secondary | ICD-10-CM | POA: Diagnosis not present

## 2020-03-01 DIAGNOSIS — I5042 Chronic combined systolic (congestive) and diastolic (congestive) heart failure: Secondary | ICD-10-CM | POA: Diagnosis not present

## 2020-03-01 DIAGNOSIS — Z9884 Bariatric surgery status: Secondary | ICD-10-CM | POA: Diagnosis not present

## 2020-03-01 DIAGNOSIS — H33322 Round hole, left eye: Secondary | ICD-10-CM | POA: Diagnosis not present

## 2020-03-01 DIAGNOSIS — M25622 Stiffness of left elbow, not elsewhere classified: Secondary | ICD-10-CM | POA: Diagnosis not present

## 2020-03-01 DIAGNOSIS — S3993XA Unspecified injury of pelvis, initial encounter: Secondary | ICD-10-CM | POA: Diagnosis not present

## 2020-03-01 DIAGNOSIS — I771 Stricture of artery: Secondary | ICD-10-CM | POA: Diagnosis not present

## 2020-03-01 DIAGNOSIS — Z85038 Personal history of other malignant neoplasm of large intestine: Secondary | ICD-10-CM | POA: Diagnosis not present

## 2020-03-01 DIAGNOSIS — M961 Postlaminectomy syndrome, not elsewhere classified: Secondary | ICD-10-CM | POA: Diagnosis not present

## 2020-03-01 DIAGNOSIS — M5136 Other intervertebral disc degeneration, lumbar region: Secondary | ICD-10-CM | POA: Diagnosis not present

## 2020-03-01 DIAGNOSIS — D6481 Anemia due to antineoplastic chemotherapy: Secondary | ICD-10-CM | POA: Diagnosis not present

## 2020-03-01 DIAGNOSIS — Z902 Acquired absence of lung [part of]: Secondary | ICD-10-CM | POA: Diagnosis not present

## 2020-03-01 DIAGNOSIS — J42 Unspecified chronic bronchitis: Secondary | ICD-10-CM | POA: Diagnosis not present

## 2020-03-01 DIAGNOSIS — J479 Bronchiectasis, uncomplicated: Secondary | ICD-10-CM | POA: Diagnosis not present

## 2020-03-01 DIAGNOSIS — Z955 Presence of coronary angioplasty implant and graft: Secondary | ICD-10-CM | POA: Diagnosis not present

## 2020-03-01 DIAGNOSIS — N398 Other specified disorders of urinary system: Secondary | ICD-10-CM | POA: Diagnosis not present

## 2020-03-01 DIAGNOSIS — T50905A Adverse effect of unspecified drugs, medicaments and biological substances, initial encounter: Secondary | ICD-10-CM | POA: Diagnosis not present

## 2020-03-01 DIAGNOSIS — K5903 Drug induced constipation: Secondary | ICD-10-CM | POA: Diagnosis not present

## 2020-03-01 DIAGNOSIS — H40023 Open angle with borderline findings, high risk, bilateral: Secondary | ICD-10-CM | POA: Diagnosis not present

## 2020-03-01 DIAGNOSIS — F4312 Post-traumatic stress disorder, chronic: Secondary | ICD-10-CM | POA: Diagnosis not present

## 2020-03-01 DIAGNOSIS — I209 Angina pectoris, unspecified: Secondary | ICD-10-CM | POA: Diagnosis not present

## 2020-03-01 DIAGNOSIS — R1031 Right lower quadrant pain: Secondary | ICD-10-CM | POA: Diagnosis not present

## 2020-03-01 DIAGNOSIS — B351 Tinea unguium: Secondary | ICD-10-CM | POA: Diagnosis not present

## 2020-03-01 DIAGNOSIS — I152 Hypertension secondary to endocrine disorders: Secondary | ICD-10-CM | POA: Diagnosis not present

## 2020-03-01 DIAGNOSIS — M79621 Pain in right upper arm: Secondary | ICD-10-CM | POA: Diagnosis not present

## 2020-03-01 DIAGNOSIS — I951 Orthostatic hypotension: Secondary | ICD-10-CM | POA: Diagnosis not present

## 2020-03-01 DIAGNOSIS — I2510 Atherosclerotic heart disease of native coronary artery without angina pectoris: Secondary | ICD-10-CM | POA: Diagnosis not present

## 2020-03-01 DIAGNOSIS — E559 Vitamin D deficiency, unspecified: Secondary | ICD-10-CM | POA: Diagnosis not present

## 2020-03-01 DIAGNOSIS — C786 Secondary malignant neoplasm of retroperitoneum and peritoneum: Secondary | ICD-10-CM | POA: Diagnosis not present

## 2020-03-01 DIAGNOSIS — I69390 Apraxia following cerebral infarction: Secondary | ICD-10-CM | POA: Diagnosis not present

## 2020-03-01 DIAGNOSIS — F458 Other somatoform disorders: Secondary | ICD-10-CM | POA: Diagnosis not present

## 2020-03-01 DIAGNOSIS — N61 Mastitis without abscess: Secondary | ICD-10-CM | POA: Diagnosis not present

## 2020-03-01 DIAGNOSIS — C652 Malignant neoplasm of left renal pelvis: Secondary | ICD-10-CM | POA: Diagnosis not present

## 2020-03-01 DIAGNOSIS — N993 Prolapse of vaginal vault after hysterectomy: Secondary | ICD-10-CM | POA: Diagnosis not present

## 2020-03-01 DIAGNOSIS — R05 Cough: Secondary | ICD-10-CM | POA: Diagnosis not present

## 2020-03-01 DIAGNOSIS — M89371 Hypertrophy of bone, right ankle and foot: Secondary | ICD-10-CM | POA: Diagnosis not present

## 2020-03-01 DIAGNOSIS — R55 Syncope and collapse: Secondary | ICD-10-CM | POA: Diagnosis not present

## 2020-03-01 DIAGNOSIS — I6521 Occlusion and stenosis of right carotid artery: Secondary | ICD-10-CM | POA: Diagnosis not present

## 2020-03-01 DIAGNOSIS — M1A00X0 Idiopathic chronic gout, unspecified site, without tophus (tophi): Secondary | ICD-10-CM | POA: Diagnosis not present

## 2020-03-01 DIAGNOSIS — G4700 Insomnia, unspecified: Secondary | ICD-10-CM | POA: Diagnosis not present

## 2020-03-01 DIAGNOSIS — L905 Scar conditions and fibrosis of skin: Secondary | ICD-10-CM | POA: Diagnosis not present

## 2020-03-01 DIAGNOSIS — I639 Cerebral infarction, unspecified: Secondary | ICD-10-CM | POA: Diagnosis not present

## 2020-03-01 DIAGNOSIS — H33301 Unspecified retinal break, right eye: Secondary | ICD-10-CM | POA: Diagnosis not present

## 2020-03-01 DIAGNOSIS — C61 Malignant neoplasm of prostate: Secondary | ICD-10-CM | POA: Diagnosis not present

## 2020-03-01 DIAGNOSIS — G4483 Primary cough headache: Secondary | ICD-10-CM | POA: Diagnosis not present

## 2020-03-01 DIAGNOSIS — S069X9S Unspecified intracranial injury with loss of consciousness of unspecified duration, sequela: Secondary | ICD-10-CM | POA: Diagnosis not present

## 2020-03-01 DIAGNOSIS — Z9011 Acquired absence of right breast and nipple: Secondary | ICD-10-CM | POA: Diagnosis not present

## 2020-03-01 DIAGNOSIS — E038 Other specified hypothyroidism: Secondary | ICD-10-CM | POA: Diagnosis not present

## 2020-03-01 DIAGNOSIS — M25559 Pain in unspecified hip: Secondary | ICD-10-CM | POA: Diagnosis not present

## 2020-03-01 DIAGNOSIS — Z510 Encounter for antineoplastic radiation therapy: Secondary | ICD-10-CM | POA: Diagnosis not present

## 2020-03-01 DIAGNOSIS — D518 Other vitamin B12 deficiency anemias: Secondary | ICD-10-CM | POA: Diagnosis not present

## 2020-03-01 DIAGNOSIS — C880 Waldenstrom macroglobulinemia: Secondary | ICD-10-CM | POA: Diagnosis not present

## 2020-03-01 DIAGNOSIS — J310 Chronic rhinitis: Secondary | ICD-10-CM | POA: Diagnosis not present

## 2020-03-01 DIAGNOSIS — I120 Hypertensive chronic kidney disease with stage 5 chronic kidney disease or end stage renal disease: Secondary | ICD-10-CM | POA: Diagnosis not present

## 2020-03-01 DIAGNOSIS — E1149 Type 2 diabetes mellitus with other diabetic neurological complication: Secondary | ICD-10-CM | POA: Diagnosis not present

## 2020-03-01 DIAGNOSIS — S6991XA Unspecified injury of right wrist, hand and finger(s), initial encounter: Secondary | ICD-10-CM | POA: Diagnosis not present

## 2020-03-01 DIAGNOSIS — L298 Other pruritus: Secondary | ICD-10-CM | POA: Diagnosis not present

## 2020-03-01 DIAGNOSIS — R1013 Epigastric pain: Secondary | ICD-10-CM | POA: Diagnosis not present

## 2020-03-01 DIAGNOSIS — Z808 Family history of malignant neoplasm of other organs or systems: Secondary | ICD-10-CM | POA: Diagnosis not present

## 2020-03-01 DIAGNOSIS — M4035 Flatback syndrome, thoracolumbar region: Secondary | ICD-10-CM | POA: Diagnosis not present

## 2020-03-01 DIAGNOSIS — K3184 Gastroparesis: Secondary | ICD-10-CM | POA: Diagnosis not present

## 2020-03-01 DIAGNOSIS — Z6820 Body mass index (BMI) 20.0-20.9, adult: Secondary | ICD-10-CM | POA: Diagnosis not present

## 2020-03-01 DIAGNOSIS — Z7952 Long term (current) use of systemic steroids: Secondary | ICD-10-CM | POA: Diagnosis not present

## 2020-03-01 DIAGNOSIS — R29818 Other symptoms and signs involving the nervous system: Secondary | ICD-10-CM | POA: Diagnosis not present

## 2020-03-01 DIAGNOSIS — N3001 Acute cystitis with hematuria: Secondary | ICD-10-CM | POA: Diagnosis not present

## 2020-03-01 DIAGNOSIS — H40013 Open angle with borderline findings, low risk, bilateral: Secondary | ICD-10-CM | POA: Diagnosis not present

## 2020-03-01 DIAGNOSIS — N3281 Overactive bladder: Secondary | ICD-10-CM | POA: Diagnosis not present

## 2020-03-01 DIAGNOSIS — K2930 Chronic superficial gastritis without bleeding: Secondary | ICD-10-CM | POA: Diagnosis not present

## 2020-03-01 DIAGNOSIS — G5602 Carpal tunnel syndrome, left upper limb: Secondary | ICD-10-CM | POA: Diagnosis not present

## 2020-03-01 DIAGNOSIS — H6120 Impacted cerumen, unspecified ear: Secondary | ICD-10-CM | POA: Diagnosis not present

## 2020-03-01 DIAGNOSIS — R339 Retention of urine, unspecified: Secondary | ICD-10-CM | POA: Diagnosis not present

## 2020-03-01 DIAGNOSIS — C538 Malignant neoplasm of overlapping sites of cervix uteri: Secondary | ICD-10-CM | POA: Diagnosis not present

## 2020-03-01 DIAGNOSIS — I130 Hypertensive heart and chronic kidney disease with heart failure and stage 1 through stage 4 chronic kidney disease, or unspecified chronic kidney disease: Secondary | ICD-10-CM | POA: Diagnosis not present

## 2020-03-01 DIAGNOSIS — Z471 Aftercare following joint replacement surgery: Secondary | ICD-10-CM | POA: Diagnosis not present

## 2020-03-01 DIAGNOSIS — M546 Pain in thoracic spine: Secondary | ICD-10-CM | POA: Diagnosis not present

## 2020-03-01 DIAGNOSIS — K219 Gastro-esophageal reflux disease without esophagitis: Secondary | ICD-10-CM | POA: Diagnosis not present

## 2020-03-01 DIAGNOSIS — Z87442 Personal history of urinary calculi: Secondary | ICD-10-CM | POA: Diagnosis not present

## 2020-03-01 DIAGNOSIS — R490 Dysphonia: Secondary | ICD-10-CM | POA: Diagnosis not present

## 2020-03-01 DIAGNOSIS — Z96653 Presence of artificial knee joint, bilateral: Secondary | ICD-10-CM | POA: Diagnosis not present

## 2020-03-01 DIAGNOSIS — I959 Hypotension, unspecified: Secondary | ICD-10-CM | POA: Diagnosis not present

## 2020-03-01 DIAGNOSIS — M79643 Pain in unspecified hand: Secondary | ICD-10-CM | POA: Diagnosis not present

## 2020-03-01 DIAGNOSIS — M79644 Pain in right finger(s): Secondary | ICD-10-CM | POA: Diagnosis not present

## 2020-03-01 DIAGNOSIS — R509 Fever, unspecified: Secondary | ICD-10-CM | POA: Diagnosis not present

## 2020-03-01 DIAGNOSIS — E113553 Type 2 diabetes mellitus with stable proliferative diabetic retinopathy, bilateral: Secondary | ICD-10-CM | POA: Diagnosis not present

## 2020-03-01 DIAGNOSIS — H5212 Myopia, left eye: Secondary | ICD-10-CM | POA: Diagnosis not present

## 2020-03-01 DIAGNOSIS — Z87891 Personal history of nicotine dependence: Secondary | ICD-10-CM | POA: Diagnosis not present

## 2020-03-01 DIAGNOSIS — I83019 Varicose veins of right lower extremity with ulcer of unspecified site: Secondary | ICD-10-CM | POA: Diagnosis not present

## 2020-03-01 DIAGNOSIS — L97412 Non-pressure chronic ulcer of right heel and midfoot with fat layer exposed: Secondary | ICD-10-CM | POA: Diagnosis not present

## 2020-03-01 DIAGNOSIS — N200 Calculus of kidney: Secondary | ICD-10-CM | POA: Diagnosis not present

## 2020-03-01 DIAGNOSIS — N1832 Chronic kidney disease, stage 3b: Secondary | ICD-10-CM | POA: Diagnosis not present

## 2020-03-01 DIAGNOSIS — D229 Melanocytic nevi, unspecified: Secondary | ICD-10-CM | POA: Diagnosis not present

## 2020-03-01 DIAGNOSIS — F520 Hypoactive sexual desire disorder: Secondary | ICD-10-CM | POA: Diagnosis not present

## 2020-03-01 DIAGNOSIS — E7801 Familial hypercholesterolemia: Secondary | ICD-10-CM | POA: Diagnosis not present

## 2020-03-01 DIAGNOSIS — M1612 Unilateral primary osteoarthritis, left hip: Secondary | ICD-10-CM | POA: Diagnosis not present

## 2020-03-01 DIAGNOSIS — H35372 Puckering of macula, left eye: Secondary | ICD-10-CM | POA: Diagnosis not present

## 2020-03-01 DIAGNOSIS — J3089 Other allergic rhinitis: Secondary | ICD-10-CM | POA: Diagnosis not present

## 2020-03-01 DIAGNOSIS — R52 Pain, unspecified: Secondary | ICD-10-CM | POA: Diagnosis not present

## 2020-03-01 DIAGNOSIS — G9009 Other idiopathic peripheral autonomic neuropathy: Secondary | ICD-10-CM | POA: Diagnosis not present

## 2020-03-01 DIAGNOSIS — R443 Hallucinations, unspecified: Secondary | ICD-10-CM | POA: Diagnosis not present

## 2020-03-01 DIAGNOSIS — L650 Telogen effluvium: Secondary | ICD-10-CM | POA: Diagnosis not present

## 2020-03-01 DIAGNOSIS — H409 Unspecified glaucoma: Secondary | ICD-10-CM | POA: Diagnosis not present

## 2020-03-01 DIAGNOSIS — L97812 Non-pressure chronic ulcer of other part of right lower leg with fat layer exposed: Secondary | ICD-10-CM | POA: Diagnosis not present

## 2020-03-01 DIAGNOSIS — M7918 Myalgia, other site: Secondary | ICD-10-CM | POA: Diagnosis not present

## 2020-03-01 DIAGNOSIS — E042 Nontoxic multinodular goiter: Secondary | ICD-10-CM | POA: Diagnosis not present

## 2020-03-01 DIAGNOSIS — H01009 Unspecified blepharitis unspecified eye, unspecified eyelid: Secondary | ICD-10-CM | POA: Diagnosis not present

## 2020-03-01 DIAGNOSIS — H401134 Primary open-angle glaucoma, bilateral, indeterminate stage: Secondary | ICD-10-CM | POA: Diagnosis not present

## 2020-03-01 DIAGNOSIS — J4540 Moderate persistent asthma, uncomplicated: Secondary | ICD-10-CM | POA: Diagnosis not present

## 2020-03-01 DIAGNOSIS — N950 Postmenopausal bleeding: Secondary | ICD-10-CM | POA: Diagnosis not present

## 2020-03-01 DIAGNOSIS — R110 Nausea: Secondary | ICD-10-CM | POA: Diagnosis not present

## 2020-03-01 DIAGNOSIS — T8544XA Capsular contracture of breast implant, initial encounter: Secondary | ICD-10-CM | POA: Diagnosis not present

## 2020-03-01 DIAGNOSIS — F0390 Unspecified dementia without behavioral disturbance: Secondary | ICD-10-CM | POA: Diagnosis not present

## 2020-03-01 DIAGNOSIS — H1131 Conjunctival hemorrhage, right eye: Secondary | ICD-10-CM | POA: Diagnosis not present

## 2020-03-01 DIAGNOSIS — Z9049 Acquired absence of other specified parts of digestive tract: Secondary | ICD-10-CM | POA: Diagnosis not present

## 2020-03-01 DIAGNOSIS — H6983 Other specified disorders of Eustachian tube, bilateral: Secondary | ICD-10-CM | POA: Diagnosis not present

## 2020-03-01 DIAGNOSIS — M21371 Foot drop, right foot: Secondary | ICD-10-CM | POA: Diagnosis not present

## 2020-03-01 DIAGNOSIS — Z888 Allergy status to other drugs, medicaments and biological substances status: Secondary | ICD-10-CM | POA: Diagnosis not present

## 2020-03-01 DIAGNOSIS — R918 Other nonspecific abnormal finding of lung field: Secondary | ICD-10-CM | POA: Diagnosis not present

## 2020-03-01 DIAGNOSIS — I495 Sick sinus syndrome: Secondary | ICD-10-CM | POA: Diagnosis not present

## 2020-03-01 DIAGNOSIS — F17210 Nicotine dependence, cigarettes, uncomplicated: Secondary | ICD-10-CM | POA: Diagnosis not present

## 2020-03-01 DIAGNOSIS — I2583 Coronary atherosclerosis due to lipid rich plaque: Secondary | ICD-10-CM | POA: Diagnosis not present

## 2020-03-01 DIAGNOSIS — S81811A Laceration without foreign body, right lower leg, initial encounter: Secondary | ICD-10-CM | POA: Diagnosis not present

## 2020-03-01 DIAGNOSIS — I440 Atrioventricular block, first degree: Secondary | ICD-10-CM | POA: Diagnosis not present

## 2020-03-01 DIAGNOSIS — K3189 Other diseases of stomach and duodenum: Secondary | ICD-10-CM | POA: Diagnosis not present

## 2020-03-01 DIAGNOSIS — N433 Hydrocele, unspecified: Secondary | ICD-10-CM | POA: Diagnosis not present

## 2020-03-01 DIAGNOSIS — S42255A Nondisplaced fracture of greater tuberosity of left humerus, initial encounter for closed fracture: Secondary | ICD-10-CM | POA: Diagnosis not present

## 2020-03-01 DIAGNOSIS — Z20822 Contact with and (suspected) exposure to covid-19: Secondary | ICD-10-CM | POA: Diagnosis not present

## 2020-03-01 DIAGNOSIS — D649 Anemia, unspecified: Secondary | ICD-10-CM | POA: Diagnosis not present

## 2020-03-01 DIAGNOSIS — I6782 Cerebral ischemia: Secondary | ICD-10-CM | POA: Diagnosis not present

## 2020-03-01 DIAGNOSIS — H2513 Age-related nuclear cataract, bilateral: Secondary | ICD-10-CM | POA: Diagnosis not present

## 2020-03-01 DIAGNOSIS — I5189 Other ill-defined heart diseases: Secondary | ICD-10-CM | POA: Diagnosis not present

## 2020-03-01 DIAGNOSIS — Z7982 Long term (current) use of aspirin: Secondary | ICD-10-CM | POA: Diagnosis not present

## 2020-03-01 DIAGNOSIS — I350 Nonrheumatic aortic (valve) stenosis: Secondary | ICD-10-CM | POA: Diagnosis not present

## 2020-03-01 DIAGNOSIS — A6920 Lyme disease, unspecified: Secondary | ICD-10-CM | POA: Diagnosis not present

## 2020-03-01 DIAGNOSIS — M130 Polyarthritis, unspecified: Secondary | ICD-10-CM | POA: Diagnosis not present

## 2020-03-01 DIAGNOSIS — M810 Age-related osteoporosis without current pathological fracture: Secondary | ICD-10-CM | POA: Diagnosis not present

## 2020-03-01 DIAGNOSIS — M5116 Intervertebral disc disorders with radiculopathy, lumbar region: Secondary | ICD-10-CM | POA: Diagnosis not present

## 2020-03-01 DIAGNOSIS — R600 Localized edema: Secondary | ICD-10-CM | POA: Diagnosis not present

## 2020-03-01 DIAGNOSIS — N3943 Post-void dribbling: Secondary | ICD-10-CM | POA: Diagnosis not present

## 2020-03-01 DIAGNOSIS — C50311 Malignant neoplasm of lower-inner quadrant of right female breast: Secondary | ICD-10-CM | POA: Diagnosis not present

## 2020-03-01 DIAGNOSIS — Z1212 Encounter for screening for malignant neoplasm of rectum: Secondary | ICD-10-CM | POA: Diagnosis not present

## 2020-03-01 DIAGNOSIS — Z7951 Long term (current) use of inhaled steroids: Secondary | ICD-10-CM | POA: Diagnosis not present

## 2020-03-01 DIAGNOSIS — H353121 Nonexudative age-related macular degeneration, left eye, early dry stage: Secondary | ICD-10-CM | POA: Diagnosis not present

## 2020-03-01 DIAGNOSIS — I8500 Esophageal varices without bleeding: Secondary | ICD-10-CM | POA: Diagnosis not present

## 2020-03-01 DIAGNOSIS — E611 Iron deficiency: Secondary | ICD-10-CM | POA: Diagnosis not present

## 2020-03-01 DIAGNOSIS — Z1329 Encounter for screening for other suspected endocrine disorder: Secondary | ICD-10-CM | POA: Diagnosis not present

## 2020-03-01 DIAGNOSIS — Z79890 Hormone replacement therapy (postmenopausal): Secondary | ICD-10-CM | POA: Diagnosis not present

## 2020-03-01 DIAGNOSIS — Z136 Encounter for screening for cardiovascular disorders: Secondary | ICD-10-CM | POA: Diagnosis not present

## 2020-03-01 DIAGNOSIS — M9901 Segmental and somatic dysfunction of cervical region: Secondary | ICD-10-CM | POA: Diagnosis not present

## 2020-03-01 DIAGNOSIS — I119 Hypertensive heart disease without heart failure: Secondary | ICD-10-CM | POA: Diagnosis not present

## 2020-03-01 DIAGNOSIS — M9902 Segmental and somatic dysfunction of thoracic region: Secondary | ICD-10-CM | POA: Diagnosis not present

## 2020-03-01 DIAGNOSIS — M25579 Pain in unspecified ankle and joints of unspecified foot: Secondary | ICD-10-CM | POA: Diagnosis not present

## 2020-03-01 DIAGNOSIS — H35413 Lattice degeneration of retina, bilateral: Secondary | ICD-10-CM | POA: Diagnosis not present

## 2020-03-01 DIAGNOSIS — M4312 Spondylolisthesis, cervical region: Secondary | ICD-10-CM | POA: Diagnosis not present

## 2020-03-01 DIAGNOSIS — J810 Acute pulmonary edema: Secondary | ICD-10-CM | POA: Diagnosis not present

## 2020-03-01 DIAGNOSIS — A419 Sepsis, unspecified organism: Secondary | ICD-10-CM | POA: Diagnosis not present

## 2020-03-01 DIAGNOSIS — B538 Other malaria, not elsewhere classified: Secondary | ICD-10-CM | POA: Diagnosis not present

## 2020-03-01 DIAGNOSIS — H0102A Squamous blepharitis right eye, upper and lower eyelids: Secondary | ICD-10-CM | POA: Diagnosis not present

## 2020-03-01 DIAGNOSIS — L661 Lichen planopilaris: Secondary | ICD-10-CM | POA: Diagnosis not present

## 2020-03-01 DIAGNOSIS — D631 Anemia in chronic kidney disease: Secondary | ICD-10-CM | POA: Diagnosis not present

## 2020-03-01 DIAGNOSIS — M25569 Pain in unspecified knee: Secondary | ICD-10-CM | POA: Diagnosis not present

## 2020-03-01 DIAGNOSIS — R3915 Urgency of urination: Secondary | ICD-10-CM | POA: Diagnosis not present

## 2020-03-01 DIAGNOSIS — S92334D Nondisplaced fracture of third metatarsal bone, right foot, subsequent encounter for fracture with routine healing: Secondary | ICD-10-CM | POA: Diagnosis not present

## 2020-03-01 DIAGNOSIS — M85852 Other specified disorders of bone density and structure, left thigh: Secondary | ICD-10-CM | POA: Diagnosis not present

## 2020-03-01 DIAGNOSIS — S68627A Partial traumatic transphalangeal amputation of left little finger, initial encounter: Secondary | ICD-10-CM | POA: Diagnosis not present

## 2020-03-01 DIAGNOSIS — J411 Mucopurulent chronic bronchitis: Secondary | ICD-10-CM | POA: Diagnosis not present

## 2020-03-01 DIAGNOSIS — N281 Cyst of kidney, acquired: Secondary | ICD-10-CM | POA: Diagnosis not present

## 2020-03-01 DIAGNOSIS — R404 Transient alteration of awareness: Secondary | ICD-10-CM | POA: Diagnosis not present

## 2020-03-01 DIAGNOSIS — D508 Other iron deficiency anemias: Secondary | ICD-10-CM | POA: Diagnosis not present

## 2020-03-01 DIAGNOSIS — H35433 Paving stone degeneration of retina, bilateral: Secondary | ICD-10-CM | POA: Diagnosis not present

## 2020-03-01 DIAGNOSIS — K635 Polyp of colon: Secondary | ICD-10-CM | POA: Diagnosis not present

## 2020-03-01 DIAGNOSIS — Z6838 Body mass index (BMI) 38.0-38.9, adult: Secondary | ICD-10-CM | POA: Diagnosis not present

## 2020-03-01 DIAGNOSIS — J301 Allergic rhinitis due to pollen: Secondary | ICD-10-CM | POA: Diagnosis not present

## 2020-03-01 DIAGNOSIS — M1A39X0 Chronic gout due to renal impairment, multiple sites, without tophus (tophi): Secondary | ICD-10-CM | POA: Diagnosis not present

## 2020-03-01 DIAGNOSIS — C9000 Multiple myeloma not having achieved remission: Secondary | ICD-10-CM | POA: Diagnosis not present

## 2020-03-01 DIAGNOSIS — D485 Neoplasm of uncertain behavior of skin: Secondary | ICD-10-CM | POA: Diagnosis not present

## 2020-03-01 DIAGNOSIS — L309 Dermatitis, unspecified: Secondary | ICD-10-CM | POA: Diagnosis not present

## 2020-03-01 DIAGNOSIS — N951 Menopausal and female climacteric states: Secondary | ICD-10-CM | POA: Diagnosis not present

## 2020-03-01 DIAGNOSIS — R7309 Other abnormal glucose: Secondary | ICD-10-CM | POA: Diagnosis not present

## 2020-03-01 DIAGNOSIS — H0100B Unspecified blepharitis left eye, upper and lower eyelids: Secondary | ICD-10-CM | POA: Diagnosis not present

## 2020-03-01 DIAGNOSIS — Z4689 Encounter for fitting and adjustment of other specified devices: Secondary | ICD-10-CM | POA: Diagnosis not present

## 2020-03-01 DIAGNOSIS — R002 Palpitations: Secondary | ICD-10-CM | POA: Diagnosis not present

## 2020-03-01 DIAGNOSIS — C159 Malignant neoplasm of esophagus, unspecified: Secondary | ICD-10-CM | POA: Diagnosis not present

## 2020-03-01 DIAGNOSIS — M79662 Pain in left lower leg: Secondary | ICD-10-CM | POA: Diagnosis not present

## 2020-03-01 DIAGNOSIS — E1165 Type 2 diabetes mellitus with hyperglycemia: Secondary | ICD-10-CM | POA: Diagnosis not present

## 2020-03-01 DIAGNOSIS — K2100 Gastro-esophageal reflux disease with esophagitis, without bleeding: Secondary | ICD-10-CM | POA: Diagnosis not present

## 2020-03-01 DIAGNOSIS — F332 Major depressive disorder, recurrent severe without psychotic features: Secondary | ICD-10-CM | POA: Diagnosis not present

## 2020-03-01 DIAGNOSIS — E860 Dehydration: Secondary | ICD-10-CM | POA: Diagnosis not present

## 2020-03-01 DIAGNOSIS — M1991 Primary osteoarthritis, unspecified site: Secondary | ICD-10-CM | POA: Diagnosis not present

## 2020-03-01 DIAGNOSIS — H18892 Other specified disorders of cornea, left eye: Secondary | ICD-10-CM | POA: Diagnosis not present

## 2020-03-01 DIAGNOSIS — H3022 Posterior cyclitis, left eye: Secondary | ICD-10-CM | POA: Diagnosis not present

## 2020-03-01 DIAGNOSIS — H518 Other specified disorders of binocular movement: Secondary | ICD-10-CM | POA: Diagnosis not present

## 2020-03-01 DIAGNOSIS — K429 Umbilical hernia without obstruction or gangrene: Secondary | ICD-10-CM | POA: Diagnosis not present

## 2020-03-01 DIAGNOSIS — Z9989 Dependence on other enabling machines and devices: Secondary | ICD-10-CM | POA: Diagnosis not present

## 2020-03-01 DIAGNOSIS — F3160 Bipolar disorder, current episode mixed, unspecified: Secondary | ICD-10-CM | POA: Diagnosis not present

## 2020-03-01 DIAGNOSIS — M85851 Other specified disorders of bone density and structure, right thigh: Secondary | ICD-10-CM | POA: Diagnosis not present

## 2020-03-01 DIAGNOSIS — R778 Other specified abnormalities of plasma proteins: Secondary | ICD-10-CM | POA: Diagnosis not present

## 2020-03-01 DIAGNOSIS — M25552 Pain in left hip: Secondary | ICD-10-CM | POA: Diagnosis not present

## 2020-03-01 DIAGNOSIS — I361 Nonrheumatic tricuspid (valve) insufficiency: Secondary | ICD-10-CM | POA: Diagnosis not present

## 2020-03-01 DIAGNOSIS — M79675 Pain in left toe(s): Secondary | ICD-10-CM | POA: Diagnosis not present

## 2020-03-01 DIAGNOSIS — Z13820 Encounter for screening for osteoporosis: Secondary | ICD-10-CM | POA: Diagnosis not present

## 2020-03-01 DIAGNOSIS — Z9842 Cataract extraction status, left eye: Secondary | ICD-10-CM | POA: Diagnosis not present

## 2020-03-01 DIAGNOSIS — Z124 Encounter for screening for malignant neoplasm of cervix: Secondary | ICD-10-CM | POA: Diagnosis not present

## 2020-03-01 DIAGNOSIS — K5010 Crohn's disease of large intestine without complications: Secondary | ICD-10-CM | POA: Diagnosis not present

## 2020-03-01 DIAGNOSIS — M25541 Pain in joints of right hand: Secondary | ICD-10-CM | POA: Diagnosis not present

## 2020-03-01 DIAGNOSIS — Z9119 Patient's noncompliance with other medical treatment and regimen: Secondary | ICD-10-CM | POA: Diagnosis not present

## 2020-03-01 DIAGNOSIS — M48062 Spinal stenosis, lumbar region with neurogenic claudication: Secondary | ICD-10-CM | POA: Diagnosis not present

## 2020-03-01 DIAGNOSIS — N185 Chronic kidney disease, stage 5: Secondary | ICD-10-CM | POA: Diagnosis not present

## 2020-03-01 DIAGNOSIS — H43813 Vitreous degeneration, bilateral: Secondary | ICD-10-CM | POA: Diagnosis not present

## 2020-03-01 DIAGNOSIS — M19012 Primary osteoarthritis, left shoulder: Secondary | ICD-10-CM | POA: Diagnosis not present

## 2020-03-01 DIAGNOSIS — H3581 Retinal edema: Secondary | ICD-10-CM | POA: Diagnosis not present

## 2020-03-01 DIAGNOSIS — K222 Esophageal obstruction: Secondary | ICD-10-CM | POA: Diagnosis not present

## 2020-03-01 DIAGNOSIS — I214 Non-ST elevation (NSTEMI) myocardial infarction: Secondary | ICD-10-CM | POA: Diagnosis not present

## 2020-03-01 DIAGNOSIS — Z5181 Encounter for therapeutic drug level monitoring: Secondary | ICD-10-CM | POA: Diagnosis not present

## 2020-03-01 DIAGNOSIS — H5201 Hypermetropia, right eye: Secondary | ICD-10-CM | POA: Diagnosis not present

## 2020-03-01 DIAGNOSIS — Z9181 History of falling: Secondary | ICD-10-CM | POA: Diagnosis not present

## 2020-03-01 DIAGNOSIS — I890 Lymphedema, not elsewhere classified: Secondary | ICD-10-CM | POA: Diagnosis not present

## 2020-03-01 DIAGNOSIS — H547 Unspecified visual loss: Secondary | ICD-10-CM | POA: Diagnosis not present

## 2020-03-01 DIAGNOSIS — F410 Panic disorder [episodic paroxysmal anxiety] without agoraphobia: Secondary | ICD-10-CM | POA: Diagnosis not present

## 2020-03-01 DIAGNOSIS — M160 Bilateral primary osteoarthritis of hip: Secondary | ICD-10-CM | POA: Diagnosis not present

## 2020-03-01 DIAGNOSIS — E113512 Type 2 diabetes mellitus with proliferative diabetic retinopathy with macular edema, left eye: Secondary | ICD-10-CM | POA: Diagnosis not present

## 2020-03-01 DIAGNOSIS — E113213 Type 2 diabetes mellitus with mild nonproliferative diabetic retinopathy with macular edema, bilateral: Secondary | ICD-10-CM | POA: Diagnosis not present

## 2020-03-01 DIAGNOSIS — Z9981 Dependence on supplemental oxygen: Secondary | ICD-10-CM | POA: Diagnosis not present

## 2020-03-01 DIAGNOSIS — I480 Paroxysmal atrial fibrillation: Secondary | ICD-10-CM | POA: Diagnosis not present

## 2020-03-01 DIAGNOSIS — I25708 Atherosclerosis of coronary artery bypass graft(s), unspecified, with other forms of angina pectoris: Secondary | ICD-10-CM | POA: Diagnosis not present

## 2020-03-01 DIAGNOSIS — I5031 Acute diastolic (congestive) heart failure: Secondary | ICD-10-CM | POA: Diagnosis not present

## 2020-03-01 DIAGNOSIS — R269 Unspecified abnormalities of gait and mobility: Secondary | ICD-10-CM | POA: Diagnosis not present

## 2020-03-01 DIAGNOSIS — E7800 Pure hypercholesterolemia, unspecified: Secondary | ICD-10-CM | POA: Diagnosis not present

## 2020-03-01 DIAGNOSIS — N3289 Other specified disorders of bladder: Secondary | ICD-10-CM | POA: Diagnosis not present

## 2020-03-01 DIAGNOSIS — M349 Systemic sclerosis, unspecified: Secondary | ICD-10-CM | POA: Diagnosis not present

## 2020-03-01 DIAGNOSIS — H43822 Vitreomacular adhesion, left eye: Secondary | ICD-10-CM | POA: Diagnosis not present

## 2020-03-01 DIAGNOSIS — Z1231 Encounter for screening mammogram for malignant neoplasm of breast: Secondary | ICD-10-CM | POA: Diagnosis not present

## 2020-03-01 DIAGNOSIS — D04112 Carcinoma in situ of skin of right lower eyelid, including canthus: Secondary | ICD-10-CM | POA: Diagnosis not present

## 2020-03-01 DIAGNOSIS — L570 Actinic keratosis: Secondary | ICD-10-CM | POA: Diagnosis not present

## 2020-03-01 DIAGNOSIS — E876 Hypokalemia: Secondary | ICD-10-CM | POA: Diagnosis not present

## 2020-03-01 DIAGNOSIS — D801 Nonfamilial hypogammaglobulinemia: Secondary | ICD-10-CM | POA: Diagnosis not present

## 2020-03-01 DIAGNOSIS — Z95818 Presence of other cardiac implants and grafts: Secondary | ICD-10-CM | POA: Diagnosis not present

## 2020-03-01 DIAGNOSIS — R1011 Right upper quadrant pain: Secondary | ICD-10-CM | POA: Diagnosis not present

## 2020-03-01 DIAGNOSIS — M25631 Stiffness of right wrist, not elsewhere classified: Secondary | ICD-10-CM | POA: Diagnosis not present

## 2020-03-01 DIAGNOSIS — H16001 Unspecified corneal ulcer, right eye: Secondary | ICD-10-CM | POA: Diagnosis not present

## 2020-03-01 DIAGNOSIS — H40153 Residual stage of open-angle glaucoma, bilateral: Secondary | ICD-10-CM | POA: Diagnosis not present

## 2020-03-01 DIAGNOSIS — F5104 Psychophysiologic insomnia: Secondary | ICD-10-CM | POA: Diagnosis not present

## 2020-03-01 DIAGNOSIS — E2839 Other primary ovarian failure: Secondary | ICD-10-CM | POA: Diagnosis not present

## 2020-03-01 DIAGNOSIS — E8809 Other disorders of plasma-protein metabolism, not elsewhere classified: Secondary | ICD-10-CM | POA: Diagnosis not present

## 2020-03-01 DIAGNOSIS — M9904 Segmental and somatic dysfunction of sacral region: Secondary | ICD-10-CM | POA: Diagnosis not present

## 2020-03-01 DIAGNOSIS — Z1151 Encounter for screening for human papillomavirus (HPV): Secondary | ICD-10-CM | POA: Diagnosis not present

## 2020-03-01 DIAGNOSIS — G5621 Lesion of ulnar nerve, right upper limb: Secondary | ICD-10-CM | POA: Diagnosis not present

## 2020-03-01 DIAGNOSIS — Z974 Presence of external hearing-aid: Secondary | ICD-10-CM | POA: Diagnosis not present

## 2020-03-01 DIAGNOSIS — R279 Unspecified lack of coordination: Secondary | ICD-10-CM | POA: Diagnosis not present

## 2020-03-01 DIAGNOSIS — M19031 Primary osteoarthritis, right wrist: Secondary | ICD-10-CM | POA: Diagnosis not present

## 2020-03-01 DIAGNOSIS — Z923 Personal history of irradiation: Secondary | ICD-10-CM | POA: Diagnosis not present

## 2020-03-01 DIAGNOSIS — H906 Mixed conductive and sensorineural hearing loss, bilateral: Secondary | ICD-10-CM | POA: Diagnosis not present

## 2020-03-01 DIAGNOSIS — L900 Lichen sclerosus et atrophicus: Secondary | ICD-10-CM | POA: Diagnosis not present

## 2020-03-01 DIAGNOSIS — W19XXXA Unspecified fall, initial encounter: Secondary | ICD-10-CM | POA: Diagnosis not present

## 2020-03-01 DIAGNOSIS — K5790 Diverticulosis of intestine, part unspecified, without perforation or abscess without bleeding: Secondary | ICD-10-CM | POA: Diagnosis not present

## 2020-03-01 DIAGNOSIS — Z48812 Encounter for surgical aftercare following surgery on the circulatory system: Secondary | ICD-10-CM | POA: Diagnosis not present

## 2020-03-01 DIAGNOSIS — S82042D Displaced comminuted fracture of left patella, subsequent encounter for closed fracture with routine healing: Secondary | ICD-10-CM | POA: Diagnosis not present

## 2020-03-01 DIAGNOSIS — H527 Unspecified disorder of refraction: Secondary | ICD-10-CM | POA: Diagnosis not present

## 2020-03-01 DIAGNOSIS — H402213 Chronic angle-closure glaucoma, right eye, severe stage: Secondary | ICD-10-CM | POA: Diagnosis not present

## 2020-03-01 DIAGNOSIS — R945 Abnormal results of liver function studies: Secondary | ICD-10-CM | POA: Diagnosis not present

## 2020-03-01 DIAGNOSIS — I70209 Unspecified atherosclerosis of native arteries of extremities, unspecified extremity: Secondary | ICD-10-CM | POA: Diagnosis not present

## 2020-03-01 DIAGNOSIS — S60221A Contusion of right hand, initial encounter: Secondary | ICD-10-CM | POA: Diagnosis not present

## 2020-03-01 DIAGNOSIS — M1A9XX1 Chronic gout, unspecified, with tophus (tophi): Secondary | ICD-10-CM | POA: Diagnosis not present

## 2020-03-01 DIAGNOSIS — Z4789 Encounter for other orthopedic aftercare: Secondary | ICD-10-CM | POA: Diagnosis not present

## 2020-03-01 DIAGNOSIS — H43811 Vitreous degeneration, right eye: Secondary | ICD-10-CM | POA: Diagnosis not present

## 2020-03-01 DIAGNOSIS — Z8262 Family history of osteoporosis: Secondary | ICD-10-CM | POA: Diagnosis not present

## 2020-03-01 DIAGNOSIS — H35371 Puckering of macula, right eye: Secondary | ICD-10-CM | POA: Diagnosis not present

## 2020-03-01 DIAGNOSIS — M4306 Spondylolysis, lumbar region: Secondary | ICD-10-CM | POA: Diagnosis not present

## 2020-03-01 DIAGNOSIS — F4321 Adjustment disorder with depressed mood: Secondary | ICD-10-CM | POA: Diagnosis not present

## 2020-03-01 DIAGNOSIS — J4531 Mild persistent asthma with (acute) exacerbation: Secondary | ICD-10-CM | POA: Diagnosis not present

## 2020-03-01 DIAGNOSIS — S68127A Partial traumatic metacarpophalangeal amputation of left little finger, initial encounter: Secondary | ICD-10-CM | POA: Diagnosis not present

## 2020-03-01 DIAGNOSIS — D122 Benign neoplasm of ascending colon: Secondary | ICD-10-CM | POA: Diagnosis not present

## 2020-03-01 DIAGNOSIS — E1169 Type 2 diabetes mellitus with other specified complication: Secondary | ICD-10-CM | POA: Diagnosis not present

## 2020-03-01 DIAGNOSIS — M79603 Pain in arm, unspecified: Secondary | ICD-10-CM | POA: Diagnosis not present

## 2020-03-01 DIAGNOSIS — I493 Ventricular premature depolarization: Secondary | ICD-10-CM | POA: Diagnosis not present

## 2020-03-01 DIAGNOSIS — N184 Chronic kidney disease, stage 4 (severe): Secondary | ICD-10-CM | POA: Diagnosis not present

## 2020-03-01 DIAGNOSIS — J209 Acute bronchitis, unspecified: Secondary | ICD-10-CM | POA: Diagnosis not present

## 2020-03-01 DIAGNOSIS — K922 Gastrointestinal hemorrhage, unspecified: Secondary | ICD-10-CM | POA: Diagnosis not present

## 2020-03-01 DIAGNOSIS — R791 Abnormal coagulation profile: Secondary | ICD-10-CM | POA: Diagnosis not present

## 2020-03-01 DIAGNOSIS — I5033 Acute on chronic diastolic (congestive) heart failure: Secondary | ICD-10-CM | POA: Diagnosis not present

## 2020-03-01 DIAGNOSIS — F333 Major depressive disorder, recurrent, severe with psychotic symptoms: Secondary | ICD-10-CM | POA: Diagnosis not present

## 2020-03-01 DIAGNOSIS — I351 Nonrheumatic aortic (valve) insufficiency: Secondary | ICD-10-CM | POA: Diagnosis not present

## 2020-03-01 DIAGNOSIS — N2889 Other specified disorders of kidney and ureter: Secondary | ICD-10-CM | POA: Diagnosis not present

## 2020-03-01 DIAGNOSIS — D492 Neoplasm of unspecified behavior of bone, soft tissue, and skin: Secondary | ICD-10-CM | POA: Diagnosis not present

## 2020-03-01 DIAGNOSIS — N8110 Cystocele, unspecified: Secondary | ICD-10-CM | POA: Diagnosis not present

## 2020-03-01 DIAGNOSIS — E875 Hyperkalemia: Secondary | ICD-10-CM | POA: Diagnosis not present

## 2020-03-01 DIAGNOSIS — H401122 Primary open-angle glaucoma, left eye, moderate stage: Secondary | ICD-10-CM | POA: Diagnosis not present

## 2020-03-01 DIAGNOSIS — J9811 Atelectasis: Secondary | ICD-10-CM | POA: Diagnosis not present

## 2020-03-01 DIAGNOSIS — M75101 Unspecified rotator cuff tear or rupture of right shoulder, not specified as traumatic: Secondary | ICD-10-CM | POA: Diagnosis not present

## 2020-03-01 DIAGNOSIS — U071 COVID-19: Secondary | ICD-10-CM | POA: Diagnosis not present

## 2020-03-01 DIAGNOSIS — Z885 Allergy status to narcotic agent status: Secondary | ICD-10-CM | POA: Diagnosis not present

## 2020-03-01 DIAGNOSIS — M6289 Other specified disorders of muscle: Secondary | ICD-10-CM | POA: Diagnosis not present

## 2020-03-01 DIAGNOSIS — H60541 Acute eczematoid otitis externa, right ear: Secondary | ICD-10-CM | POA: Diagnosis not present

## 2020-03-01 DIAGNOSIS — D259 Leiomyoma of uterus, unspecified: Secondary | ICD-10-CM | POA: Diagnosis not present

## 2020-03-01 DIAGNOSIS — M791 Myalgia, unspecified site: Secondary | ICD-10-CM | POA: Diagnosis not present

## 2020-03-01 DIAGNOSIS — J3081 Allergic rhinitis due to animal (cat) (dog) hair and dander: Secondary | ICD-10-CM | POA: Diagnosis not present

## 2020-03-01 DIAGNOSIS — Z4802 Encounter for removal of sutures: Secondary | ICD-10-CM | POA: Diagnosis not present

## 2020-03-01 DIAGNOSIS — S81802A Unspecified open wound, left lower leg, initial encounter: Secondary | ICD-10-CM | POA: Diagnosis not present

## 2020-03-01 DIAGNOSIS — I429 Cardiomyopathy, unspecified: Secondary | ICD-10-CM | POA: Diagnosis not present

## 2020-03-01 DIAGNOSIS — L538 Other specified erythematous conditions: Secondary | ICD-10-CM | POA: Diagnosis not present

## 2020-03-01 DIAGNOSIS — G4736 Sleep related hypoventilation in conditions classified elsewhere: Secondary | ICD-10-CM | POA: Diagnosis not present

## 2020-03-01 DIAGNOSIS — R3911 Hesitancy of micturition: Secondary | ICD-10-CM | POA: Diagnosis not present

## 2020-03-01 DIAGNOSIS — R062 Wheezing: Secondary | ICD-10-CM | POA: Diagnosis not present

## 2020-03-01 DIAGNOSIS — S92324D Nondisplaced fracture of second metatarsal bone, right foot, subsequent encounter for fracture with routine healing: Secondary | ICD-10-CM | POA: Diagnosis not present

## 2020-03-01 DIAGNOSIS — C801 Malignant (primary) neoplasm, unspecified: Secondary | ICD-10-CM | POA: Diagnosis not present

## 2020-03-01 DIAGNOSIS — R351 Nocturia: Secondary | ICD-10-CM | POA: Diagnosis not present

## 2020-03-01 DIAGNOSIS — R921 Mammographic calcification found on diagnostic imaging of breast: Secondary | ICD-10-CM | POA: Diagnosis not present

## 2020-03-01 DIAGNOSIS — D6852 Prothrombin gene mutation: Secondary | ICD-10-CM | POA: Diagnosis not present

## 2020-03-01 DIAGNOSIS — G8929 Other chronic pain: Secondary | ICD-10-CM | POA: Diagnosis not present

## 2020-03-01 DIAGNOSIS — M1712 Unilateral primary osteoarthritis, left knee: Secondary | ICD-10-CM | POA: Diagnosis not present

## 2020-03-01 DIAGNOSIS — X32XXXA Exposure to sunlight, initial encounter: Secondary | ICD-10-CM | POA: Diagnosis not present

## 2020-03-01 DIAGNOSIS — F17220 Nicotine dependence, chewing tobacco, uncomplicated: Secondary | ICD-10-CM | POA: Diagnosis not present

## 2020-03-01 DIAGNOSIS — H35352 Cystoid macular degeneration, left eye: Secondary | ICD-10-CM | POA: Diagnosis not present

## 2020-03-01 DIAGNOSIS — J9600 Acute respiratory failure, unspecified whether with hypoxia or hypercapnia: Secondary | ICD-10-CM | POA: Diagnosis not present

## 2020-03-01 DIAGNOSIS — M47892 Other spondylosis, cervical region: Secondary | ICD-10-CM | POA: Diagnosis not present

## 2020-03-01 DIAGNOSIS — S83242A Other tear of medial meniscus, current injury, left knee, initial encounter: Secondary | ICD-10-CM | POA: Diagnosis not present

## 2020-03-01 DIAGNOSIS — M25641 Stiffness of right hand, not elsewhere classified: Secondary | ICD-10-CM | POA: Diagnosis not present

## 2020-03-01 DIAGNOSIS — N179 Acute kidney failure, unspecified: Secondary | ICD-10-CM | POA: Diagnosis not present

## 2020-03-01 DIAGNOSIS — H02831 Dermatochalasis of right upper eyelid: Secondary | ICD-10-CM | POA: Diagnosis not present

## 2020-03-01 DIAGNOSIS — J9621 Acute and chronic respiratory failure with hypoxia: Secondary | ICD-10-CM | POA: Diagnosis not present

## 2020-03-01 DIAGNOSIS — K7581 Nonalcoholic steatohepatitis (NASH): Secondary | ICD-10-CM | POA: Diagnosis not present

## 2020-03-01 DIAGNOSIS — Z85828 Personal history of other malignant neoplasm of skin: Secondary | ICD-10-CM | POA: Diagnosis not present

## 2020-03-01 DIAGNOSIS — K4020 Bilateral inguinal hernia, without obstruction or gangrene, not specified as recurrent: Secondary | ICD-10-CM | POA: Diagnosis not present

## 2020-03-01 DIAGNOSIS — H93A9 Pulsatile tinnitus, unspecified ear: Secondary | ICD-10-CM | POA: Diagnosis not present

## 2020-03-01 DIAGNOSIS — K921 Melena: Secondary | ICD-10-CM | POA: Diagnosis not present

## 2020-03-01 DIAGNOSIS — I701 Atherosclerosis of renal artery: Secondary | ICD-10-CM | POA: Diagnosis not present

## 2020-03-01 DIAGNOSIS — H35373 Puckering of macula, bilateral: Secondary | ICD-10-CM | POA: Diagnosis not present

## 2020-03-01 DIAGNOSIS — L432 Lichenoid drug reaction: Secondary | ICD-10-CM | POA: Diagnosis not present

## 2020-03-01 DIAGNOSIS — I471 Supraventricular tachycardia: Secondary | ICD-10-CM | POA: Diagnosis not present

## 2020-03-01 DIAGNOSIS — H35363 Drusen (degenerative) of macula, bilateral: Secondary | ICD-10-CM | POA: Diagnosis not present

## 2020-03-01 DIAGNOSIS — J439 Emphysema, unspecified: Secondary | ICD-10-CM | POA: Diagnosis not present

## 2020-03-01 DIAGNOSIS — M25474 Effusion, right foot: Secondary | ICD-10-CM | POA: Diagnosis not present

## 2020-03-01 DIAGNOSIS — M25531 Pain in right wrist: Secondary | ICD-10-CM | POA: Diagnosis not present

## 2020-03-01 DIAGNOSIS — K9049 Malabsorption due to intolerance, not elsewhere classified: Secondary | ICD-10-CM | POA: Diagnosis not present

## 2020-03-01 DIAGNOSIS — R300 Dysuria: Secondary | ICD-10-CM | POA: Diagnosis not present

## 2020-03-01 DIAGNOSIS — F3170 Bipolar disorder, currently in remission, most recent episode unspecified: Secondary | ICD-10-CM | POA: Diagnosis not present

## 2020-03-01 DIAGNOSIS — I2723 Pulmonary hypertension due to lung diseases and hypoxia: Secondary | ICD-10-CM | POA: Diagnosis not present

## 2020-03-01 DIAGNOSIS — M4726 Other spondylosis with radiculopathy, lumbar region: Secondary | ICD-10-CM | POA: Diagnosis not present

## 2020-03-01 DIAGNOSIS — M24571 Contracture, right ankle: Secondary | ICD-10-CM | POA: Diagnosis not present

## 2020-03-01 DIAGNOSIS — M4727 Other spondylosis with radiculopathy, lumbosacral region: Secondary | ICD-10-CM | POA: Diagnosis not present

## 2020-03-01 DIAGNOSIS — M25542 Pain in joints of left hand: Secondary | ICD-10-CM | POA: Diagnosis not present

## 2020-03-01 DIAGNOSIS — H401131 Primary open-angle glaucoma, bilateral, mild stage: Secondary | ICD-10-CM | POA: Diagnosis not present

## 2020-03-01 DIAGNOSIS — R935 Abnormal findings on diagnostic imaging of other abdominal regions, including retroperitoneum: Secondary | ICD-10-CM | POA: Diagnosis not present

## 2020-03-01 DIAGNOSIS — M60271 Foreign body granuloma of soft tissue, not elsewhere classified, right ankle and foot: Secondary | ICD-10-CM | POA: Diagnosis not present

## 2020-03-01 DIAGNOSIS — R109 Unspecified abdominal pain: Secondary | ICD-10-CM | POA: Diagnosis not present

## 2020-03-01 DIAGNOSIS — M549 Dorsalgia, unspecified: Secondary | ICD-10-CM | POA: Diagnosis not present

## 2020-03-01 DIAGNOSIS — M109 Gout, unspecified: Secondary | ICD-10-CM | POA: Diagnosis not present

## 2020-03-01 DIAGNOSIS — Z954 Presence of other heart-valve replacement: Secondary | ICD-10-CM | POA: Diagnosis not present

## 2020-03-01 DIAGNOSIS — I313 Pericardial effusion (noninflammatory): Secondary | ICD-10-CM | POA: Diagnosis not present

## 2020-03-01 DIAGNOSIS — Z0189 Encounter for other specified special examinations: Secondary | ICD-10-CM | POA: Diagnosis not present

## 2020-03-01 DIAGNOSIS — M62838 Other muscle spasm: Secondary | ICD-10-CM | POA: Diagnosis not present

## 2020-03-01 DIAGNOSIS — J9610 Chronic respiratory failure, unspecified whether with hypoxia or hypercapnia: Secondary | ICD-10-CM | POA: Diagnosis not present

## 2020-03-01 DIAGNOSIS — M25512 Pain in left shoulder: Secondary | ICD-10-CM | POA: Diagnosis not present

## 2020-03-01 DIAGNOSIS — S22029A Unspecified fracture of second thoracic vertebra, initial encounter for closed fracture: Secondary | ICD-10-CM | POA: Diagnosis not present

## 2020-03-01 DIAGNOSIS — I10 Essential (primary) hypertension: Secondary | ICD-10-CM | POA: Diagnosis not present

## 2020-03-01 DIAGNOSIS — L509 Urticaria, unspecified: Secondary | ICD-10-CM | POA: Diagnosis not present

## 2020-03-01 DIAGNOSIS — G2581 Restless legs syndrome: Secondary | ICD-10-CM | POA: Diagnosis not present

## 2020-03-01 DIAGNOSIS — S22039A Unspecified fracture of third thoracic vertebra, initial encounter for closed fracture: Secondary | ICD-10-CM | POA: Diagnosis not present

## 2020-03-01 DIAGNOSIS — R627 Adult failure to thrive: Secondary | ICD-10-CM | POA: Diagnosis not present

## 2020-03-01 DIAGNOSIS — G6289 Other specified polyneuropathies: Secondary | ICD-10-CM | POA: Diagnosis not present

## 2020-03-01 DIAGNOSIS — M5441 Lumbago with sciatica, right side: Secondary | ICD-10-CM | POA: Diagnosis not present

## 2020-03-01 DIAGNOSIS — N651 Disproportion of reconstructed breast: Secondary | ICD-10-CM | POA: Diagnosis not present

## 2020-03-01 DIAGNOSIS — M19011 Primary osteoarthritis, right shoulder: Secondary | ICD-10-CM | POA: Diagnosis not present

## 2020-03-01 DIAGNOSIS — Z803 Family history of malignant neoplasm of breast: Secondary | ICD-10-CM | POA: Diagnosis not present

## 2020-03-01 DIAGNOSIS — R0600 Dyspnea, unspecified: Secondary | ICD-10-CM | POA: Diagnosis not present

## 2020-03-01 DIAGNOSIS — R3129 Other microscopic hematuria: Secondary | ICD-10-CM | POA: Diagnosis not present

## 2020-03-01 DIAGNOSIS — S81829A Laceration with foreign body, unspecified lower leg, initial encounter: Secondary | ICD-10-CM | POA: Diagnosis not present

## 2020-03-01 DIAGNOSIS — R2681 Unsteadiness on feet: Secondary | ICD-10-CM | POA: Diagnosis not present

## 2020-03-01 DIAGNOSIS — E46 Unspecified protein-calorie malnutrition: Secondary | ICD-10-CM | POA: Diagnosis not present

## 2020-03-01 DIAGNOSIS — G909 Disorder of the autonomic nervous system, unspecified: Secondary | ICD-10-CM | POA: Diagnosis not present

## 2020-03-01 DIAGNOSIS — N6321 Unspecified lump in the left breast, upper outer quadrant: Secondary | ICD-10-CM | POA: Diagnosis not present

## 2020-03-01 DIAGNOSIS — K5792 Diverticulitis of intestine, part unspecified, without perforation or abscess without bleeding: Secondary | ICD-10-CM | POA: Diagnosis not present

## 2020-03-01 DIAGNOSIS — R809 Proteinuria, unspecified: Secondary | ICD-10-CM | POA: Diagnosis not present

## 2020-03-01 DIAGNOSIS — I4821 Permanent atrial fibrillation: Secondary | ICD-10-CM | POA: Diagnosis not present

## 2020-03-01 DIAGNOSIS — E441 Mild protein-calorie malnutrition: Secondary | ICD-10-CM | POA: Diagnosis not present

## 2020-03-01 DIAGNOSIS — I700 Atherosclerosis of aorta: Secondary | ICD-10-CM | POA: Diagnosis not present

## 2020-03-01 DIAGNOSIS — I25709 Atherosclerosis of coronary artery bypass graft(s), unspecified, with unspecified angina pectoris: Secondary | ICD-10-CM | POA: Diagnosis not present

## 2020-03-01 DIAGNOSIS — H0288B Meibomian gland dysfunction left eye, upper and lower eyelids: Secondary | ICD-10-CM | POA: Diagnosis not present

## 2020-03-01 DIAGNOSIS — Z8249 Family history of ischemic heart disease and other diseases of the circulatory system: Secondary | ICD-10-CM | POA: Diagnosis not present

## 2020-03-01 DIAGNOSIS — L89151 Pressure ulcer of sacral region, stage 1: Secondary | ICD-10-CM | POA: Diagnosis not present

## 2020-03-01 DIAGNOSIS — D0462 Carcinoma in situ of skin of left upper limb, including shoulder: Secondary | ICD-10-CM | POA: Diagnosis not present

## 2020-03-01 DIAGNOSIS — Z1211 Encounter for screening for malignant neoplasm of colon: Secondary | ICD-10-CM | POA: Diagnosis not present

## 2020-03-01 DIAGNOSIS — R5383 Other fatigue: Secondary | ICD-10-CM | POA: Diagnosis not present

## 2020-03-01 DIAGNOSIS — I447 Left bundle-branch block, unspecified: Secondary | ICD-10-CM | POA: Diagnosis not present

## 2020-03-01 DIAGNOSIS — G44229 Chronic tension-type headache, not intractable: Secondary | ICD-10-CM | POA: Diagnosis not present

## 2020-03-01 DIAGNOSIS — L853 Xerosis cutis: Secondary | ICD-10-CM | POA: Diagnosis not present

## 2020-03-01 DIAGNOSIS — F338 Other recurrent depressive disorders: Secondary | ICD-10-CM | POA: Diagnosis not present

## 2020-03-01 DIAGNOSIS — I420 Dilated cardiomyopathy: Secondary | ICD-10-CM | POA: Diagnosis not present

## 2020-03-01 DIAGNOSIS — M2041 Other hammer toe(s) (acquired), right foot: Secondary | ICD-10-CM | POA: Diagnosis not present

## 2020-03-01 DIAGNOSIS — N318 Other neuromuscular dysfunction of bladder: Secondary | ICD-10-CM | POA: Diagnosis not present

## 2020-03-01 DIAGNOSIS — R319 Hematuria, unspecified: Secondary | ICD-10-CM | POA: Diagnosis not present

## 2020-03-01 DIAGNOSIS — Z853 Personal history of malignant neoplasm of breast: Secondary | ICD-10-CM | POA: Diagnosis not present

## 2020-03-01 DIAGNOSIS — Z8546 Personal history of malignant neoplasm of prostate: Secondary | ICD-10-CM | POA: Diagnosis not present

## 2020-03-01 DIAGNOSIS — Z6827 Body mass index (BMI) 27.0-27.9, adult: Secondary | ICD-10-CM | POA: Diagnosis not present

## 2020-03-01 DIAGNOSIS — H04129 Dry eye syndrome of unspecified lacrimal gland: Secondary | ICD-10-CM | POA: Diagnosis not present

## 2020-03-01 DIAGNOSIS — M7712 Lateral epicondylitis, left elbow: Secondary | ICD-10-CM | POA: Diagnosis not present

## 2020-03-01 DIAGNOSIS — Z6834 Body mass index (BMI) 34.0-34.9, adult: Secondary | ICD-10-CM | POA: Diagnosis not present

## 2020-03-01 DIAGNOSIS — M5432 Sciatica, left side: Secondary | ICD-10-CM | POA: Diagnosis not present

## 2020-03-01 DIAGNOSIS — E870 Hyperosmolality and hypernatremia: Secondary | ICD-10-CM | POA: Diagnosis not present

## 2020-03-01 DIAGNOSIS — K769 Liver disease, unspecified: Secondary | ICD-10-CM | POA: Diagnosis not present

## 2020-03-01 DIAGNOSIS — Z299 Encounter for prophylactic measures, unspecified: Secondary | ICD-10-CM | POA: Diagnosis not present

## 2020-03-01 DIAGNOSIS — R8279 Other abnormal findings on microbiological examination of urine: Secondary | ICD-10-CM | POA: Diagnosis not present

## 2020-03-01 DIAGNOSIS — M25662 Stiffness of left knee, not elsewhere classified: Secondary | ICD-10-CM | POA: Diagnosis not present

## 2020-03-01 DIAGNOSIS — M5430 Sciatica, unspecified side: Secondary | ICD-10-CM | POA: Diagnosis not present

## 2020-03-01 DIAGNOSIS — G8112 Spastic hemiplegia affecting left dominant side: Secondary | ICD-10-CM | POA: Diagnosis not present

## 2020-03-01 DIAGNOSIS — M5134 Other intervertebral disc degeneration, thoracic region: Secondary | ICD-10-CM | POA: Diagnosis not present

## 2020-03-01 DIAGNOSIS — M9905 Segmental and somatic dysfunction of pelvic region: Secondary | ICD-10-CM | POA: Diagnosis not present

## 2020-03-01 DIAGNOSIS — E1121 Type 2 diabetes mellitus with diabetic nephropathy: Secondary | ICD-10-CM | POA: Diagnosis not present

## 2020-03-01 DIAGNOSIS — Z89412 Acquired absence of left great toe: Secondary | ICD-10-CM | POA: Diagnosis not present

## 2020-03-01 DIAGNOSIS — R69 Illness, unspecified: Secondary | ICD-10-CM | POA: Diagnosis not present

## 2020-03-01 DIAGNOSIS — H9191 Unspecified hearing loss, right ear: Secondary | ICD-10-CM | POA: Diagnosis not present

## 2020-03-01 DIAGNOSIS — I4891 Unspecified atrial fibrillation: Secondary | ICD-10-CM | POA: Diagnosis not present

## 2020-03-01 DIAGNOSIS — R7303 Prediabetes: Secondary | ICD-10-CM | POA: Diagnosis not present

## 2020-03-01 DIAGNOSIS — K5900 Constipation, unspecified: Secondary | ICD-10-CM | POA: Diagnosis not present

## 2020-03-01 DIAGNOSIS — J069 Acute upper respiratory infection, unspecified: Secondary | ICD-10-CM | POA: Diagnosis not present

## 2020-03-01 DIAGNOSIS — R12 Heartburn: Secondary | ICD-10-CM | POA: Diagnosis not present

## 2020-03-01 DIAGNOSIS — M8589 Other specified disorders of bone density and structure, multiple sites: Secondary | ICD-10-CM | POA: Diagnosis not present

## 2020-03-01 DIAGNOSIS — C3492 Malignant neoplasm of unspecified part of left bronchus or lung: Secondary | ICD-10-CM | POA: Diagnosis not present

## 2020-03-01 DIAGNOSIS — R590 Localized enlarged lymph nodes: Secondary | ICD-10-CM | POA: Diagnosis not present

## 2020-03-01 DIAGNOSIS — K5090 Crohn's disease, unspecified, without complications: Secondary | ICD-10-CM | POA: Diagnosis not present

## 2020-03-01 DIAGNOSIS — D689 Coagulation defect, unspecified: Secondary | ICD-10-CM | POA: Diagnosis not present

## 2020-03-01 DIAGNOSIS — I5022 Chronic systolic (congestive) heart failure: Secondary | ICD-10-CM | POA: Diagnosis not present

## 2020-03-01 DIAGNOSIS — Z95810 Presence of automatic (implantable) cardiac defibrillator: Secondary | ICD-10-CM | POA: Diagnosis not present

## 2020-03-01 DIAGNOSIS — M542 Cervicalgia: Secondary | ICD-10-CM | POA: Diagnosis not present

## 2020-03-01 DIAGNOSIS — Z6821 Body mass index (BMI) 21.0-21.9, adult: Secondary | ICD-10-CM | POA: Diagnosis not present

## 2020-03-01 DIAGNOSIS — G4489 Other headache syndrome: Secondary | ICD-10-CM | POA: Diagnosis not present

## 2020-03-01 DIAGNOSIS — M2042 Other hammer toe(s) (acquired), left foot: Secondary | ICD-10-CM | POA: Diagnosis not present

## 2020-03-01 DIAGNOSIS — M1711 Unilateral primary osteoarthritis, right knee: Secondary | ICD-10-CM | POA: Diagnosis not present

## 2020-03-01 DIAGNOSIS — C50512 Malignant neoplasm of lower-outer quadrant of left female breast: Secondary | ICD-10-CM | POA: Diagnosis not present

## 2020-03-01 DIAGNOSIS — B2790 Infectious mononucleosis, unspecified without complication: Secondary | ICD-10-CM | POA: Diagnosis not present

## 2020-03-01 DIAGNOSIS — S43402A Unspecified sprain of left shoulder joint, initial encounter: Secondary | ICD-10-CM | POA: Diagnosis not present

## 2020-03-01 DIAGNOSIS — Z23 Encounter for immunization: Secondary | ICD-10-CM | POA: Diagnosis not present

## 2020-03-01 DIAGNOSIS — R1314 Dysphagia, pharyngoesophageal phase: Secondary | ICD-10-CM | POA: Diagnosis not present

## 2020-03-01 DIAGNOSIS — M316 Other giant cell arteritis: Secondary | ICD-10-CM | POA: Diagnosis not present

## 2020-03-01 DIAGNOSIS — H1045 Other chronic allergic conjunctivitis: Secondary | ICD-10-CM | POA: Diagnosis not present

## 2020-03-01 DIAGNOSIS — H401113 Primary open-angle glaucoma, right eye, severe stage: Secondary | ICD-10-CM | POA: Diagnosis not present

## 2020-03-01 DIAGNOSIS — M7541 Impingement syndrome of right shoulder: Secondary | ICD-10-CM | POA: Diagnosis not present

## 2020-03-01 DIAGNOSIS — S72141A Displaced intertrochanteric fracture of right femur, initial encounter for closed fracture: Secondary | ICD-10-CM | POA: Diagnosis not present

## 2020-03-01 DIAGNOSIS — Z6828 Body mass index (BMI) 28.0-28.9, adult: Secondary | ICD-10-CM | POA: Diagnosis not present

## 2020-03-01 DIAGNOSIS — I83029 Varicose veins of left lower extremity with ulcer of unspecified site: Secondary | ICD-10-CM | POA: Diagnosis not present

## 2020-03-01 DIAGNOSIS — Z96643 Presence of artificial hip joint, bilateral: Secondary | ICD-10-CM | POA: Diagnosis not present

## 2020-03-01 DIAGNOSIS — M4125 Other idiopathic scoliosis, thoracolumbar region: Secondary | ICD-10-CM | POA: Diagnosis not present

## 2020-03-01 DIAGNOSIS — H401132 Primary open-angle glaucoma, bilateral, moderate stage: Secondary | ICD-10-CM | POA: Diagnosis not present

## 2020-03-01 DIAGNOSIS — L930 Discoid lupus erythematosus: Secondary | ICD-10-CM | POA: Diagnosis not present

## 2020-03-01 DIAGNOSIS — R51 Headache with orthostatic component, not elsewhere classified: Secondary | ICD-10-CM | POA: Diagnosis not present

## 2020-03-01 DIAGNOSIS — R609 Edema, unspecified: Secondary | ICD-10-CM | POA: Diagnosis not present

## 2020-03-01 DIAGNOSIS — J849 Interstitial pulmonary disease, unspecified: Secondary | ICD-10-CM | POA: Diagnosis not present

## 2020-03-01 DIAGNOSIS — Z48810 Encounter for surgical aftercare following surgery on the sense organs: Secondary | ICD-10-CM | POA: Diagnosis not present

## 2020-03-01 DIAGNOSIS — Z79891 Long term (current) use of opiate analgesic: Secondary | ICD-10-CM | POA: Diagnosis not present

## 2020-03-01 DIAGNOSIS — M5417 Radiculopathy, lumbosacral region: Secondary | ICD-10-CM | POA: Diagnosis not present

## 2020-03-01 DIAGNOSIS — E1129 Type 2 diabetes mellitus with other diabetic kidney complication: Secondary | ICD-10-CM | POA: Diagnosis not present

## 2020-03-01 DIAGNOSIS — D49512 Neoplasm of unspecified behavior of left kidney: Secondary | ICD-10-CM | POA: Diagnosis not present

## 2020-03-01 DIAGNOSIS — Z96619 Presence of unspecified artificial shoulder joint: Secondary | ICD-10-CM | POA: Diagnosis not present

## 2020-03-01 DIAGNOSIS — B0223 Postherpetic polyneuropathy: Secondary | ICD-10-CM | POA: Diagnosis not present

## 2020-03-01 DIAGNOSIS — I25118 Atherosclerotic heart disease of native coronary artery with other forms of angina pectoris: Secondary | ICD-10-CM | POA: Diagnosis not present

## 2020-03-01 DIAGNOSIS — E1143 Type 2 diabetes mellitus with diabetic autonomic (poly)neuropathy: Secondary | ICD-10-CM | POA: Diagnosis not present

## 2020-03-01 DIAGNOSIS — C8590 Non-Hodgkin lymphoma, unspecified, unspecified site: Secondary | ICD-10-CM | POA: Diagnosis not present

## 2020-03-01 DIAGNOSIS — R293 Abnormal posture: Secondary | ICD-10-CM | POA: Diagnosis not present

## 2020-03-01 DIAGNOSIS — D638 Anemia in other chronic diseases classified elsewhere: Secondary | ICD-10-CM | POA: Diagnosis not present

## 2020-03-01 DIAGNOSIS — M79676 Pain in unspecified toe(s): Secondary | ICD-10-CM | POA: Diagnosis not present

## 2020-03-01 DIAGNOSIS — K449 Diaphragmatic hernia without obstruction or gangrene: Secondary | ICD-10-CM | POA: Diagnosis not present

## 2020-03-01 DIAGNOSIS — I739 Peripheral vascular disease, unspecified: Secondary | ICD-10-CM | POA: Diagnosis not present

## 2020-03-01 DIAGNOSIS — S62622D Displaced fracture of medial phalanx of right middle finger, subsequent encounter for fracture with routine healing: Secondary | ICD-10-CM | POA: Diagnosis not present

## 2020-03-01 DIAGNOSIS — H5462 Unqualified visual loss, left eye, normal vision right eye: Secondary | ICD-10-CM | POA: Diagnosis not present

## 2020-03-01 DIAGNOSIS — R634 Abnormal weight loss: Secondary | ICD-10-CM | POA: Diagnosis not present

## 2020-03-01 DIAGNOSIS — R519 Headache, unspecified: Secondary | ICD-10-CM | POA: Diagnosis not present

## 2020-03-01 DIAGNOSIS — R2689 Other abnormalities of gait and mobility: Secondary | ICD-10-CM | POA: Diagnosis not present

## 2020-03-01 DIAGNOSIS — H20022 Recurrent acute iridocyclitis, left eye: Secondary | ICD-10-CM | POA: Diagnosis not present

## 2020-03-01 DIAGNOSIS — S92414D Nondisplaced fracture of proximal phalanx of right great toe, subsequent encounter for fracture with routine healing: Secondary | ICD-10-CM | POA: Diagnosis not present

## 2020-03-01 DIAGNOSIS — K831 Obstruction of bile duct: Secondary | ICD-10-CM | POA: Diagnosis not present

## 2020-03-01 DIAGNOSIS — F4323 Adjustment disorder with mixed anxiety and depressed mood: Secondary | ICD-10-CM | POA: Diagnosis not present

## 2020-03-01 DIAGNOSIS — E1059 Type 1 diabetes mellitus with other circulatory complications: Secondary | ICD-10-CM | POA: Diagnosis not present

## 2020-03-01 DIAGNOSIS — M47816 Spondylosis without myelopathy or radiculopathy, lumbar region: Secondary | ICD-10-CM | POA: Diagnosis not present

## 2020-03-01 DIAGNOSIS — Z131 Encounter for screening for diabetes mellitus: Secondary | ICD-10-CM | POA: Diagnosis not present

## 2020-03-01 DIAGNOSIS — M5416 Radiculopathy, lumbar region: Secondary | ICD-10-CM | POA: Diagnosis not present

## 2020-03-01 DIAGNOSIS — S0101XA Laceration without foreign body of scalp, initial encounter: Secondary | ICD-10-CM | POA: Diagnosis not present

## 2020-03-01 DIAGNOSIS — M353 Polymyalgia rheumatica: Secondary | ICD-10-CM | POA: Diagnosis not present

## 2020-03-01 DIAGNOSIS — R1312 Dysphagia, oropharyngeal phase: Secondary | ICD-10-CM | POA: Diagnosis not present

## 2020-03-01 DIAGNOSIS — R258 Other abnormal involuntary movements: Secondary | ICD-10-CM | POA: Diagnosis not present

## 2020-03-01 DIAGNOSIS — R202 Paresthesia of skin: Secondary | ICD-10-CM | POA: Diagnosis not present

## 2020-03-01 DIAGNOSIS — F319 Bipolar disorder, unspecified: Secondary | ICD-10-CM | POA: Diagnosis not present

## 2020-03-01 DIAGNOSIS — C50412 Malignant neoplasm of upper-outer quadrant of left female breast: Secondary | ICD-10-CM | POA: Diagnosis not present

## 2020-03-01 DIAGNOSIS — G629 Polyneuropathy, unspecified: Secondary | ICD-10-CM | POA: Diagnosis not present

## 2020-03-01 DIAGNOSIS — Z8551 Personal history of malignant neoplasm of bladder: Secondary | ICD-10-CM | POA: Diagnosis not present

## 2020-03-01 DIAGNOSIS — I421 Obstructive hypertrophic cardiomyopathy: Secondary | ICD-10-CM | POA: Diagnosis not present

## 2020-03-01 DIAGNOSIS — K625 Hemorrhage of anus and rectum: Secondary | ICD-10-CM | POA: Diagnosis not present

## 2020-03-01 DIAGNOSIS — Z85820 Personal history of malignant melanoma of skin: Secondary | ICD-10-CM | POA: Diagnosis not present

## 2020-03-01 DIAGNOSIS — C3411 Malignant neoplasm of upper lobe, right bronchus or lung: Secondary | ICD-10-CM | POA: Diagnosis not present

## 2020-03-01 DIAGNOSIS — S41119A Laceration without foreign body of unspecified upper arm, initial encounter: Secondary | ICD-10-CM | POA: Diagnosis not present

## 2020-03-01 DIAGNOSIS — D8989 Other specified disorders involving the immune mechanism, not elsewhere classified: Secondary | ICD-10-CM | POA: Diagnosis not present

## 2020-03-01 DIAGNOSIS — R9721 Rising PSA following treatment for malignant neoplasm of prostate: Secondary | ICD-10-CM | POA: Diagnosis not present

## 2020-03-01 DIAGNOSIS — I872 Venous insufficiency (chronic) (peripheral): Secondary | ICD-10-CM | POA: Diagnosis not present

## 2020-03-01 DIAGNOSIS — F251 Schizoaffective disorder, depressive type: Secondary | ICD-10-CM | POA: Diagnosis not present

## 2020-03-01 DIAGNOSIS — H25012 Cortical age-related cataract, left eye: Secondary | ICD-10-CM | POA: Diagnosis not present

## 2020-03-01 DIAGNOSIS — H16103 Unspecified superficial keratitis, bilateral: Secondary | ICD-10-CM | POA: Diagnosis not present

## 2020-03-01 DIAGNOSIS — M545 Low back pain: Secondary | ICD-10-CM | POA: Diagnosis not present

## 2020-03-01 DIAGNOSIS — R7301 Impaired fasting glucose: Secondary | ICD-10-CM | POA: Diagnosis not present

## 2020-03-01 DIAGNOSIS — E1122 Type 2 diabetes mellitus with diabetic chronic kidney disease: Secondary | ICD-10-CM | POA: Diagnosis not present

## 2020-03-01 DIAGNOSIS — H8110 Benign paroxysmal vertigo, unspecified ear: Secondary | ICD-10-CM | POA: Diagnosis not present

## 2020-03-01 DIAGNOSIS — D500 Iron deficiency anemia secondary to blood loss (chronic): Secondary | ICD-10-CM | POA: Diagnosis not present

## 2020-03-01 DIAGNOSIS — R3914 Feeling of incomplete bladder emptying: Secondary | ICD-10-CM | POA: Diagnosis not present

## 2020-03-01 DIAGNOSIS — J0100 Acute maxillary sinusitis, unspecified: Secondary | ICD-10-CM | POA: Diagnosis not present

## 2020-03-01 DIAGNOSIS — I255 Ischemic cardiomyopathy: Secondary | ICD-10-CM | POA: Diagnosis not present

## 2020-03-01 DIAGNOSIS — S42022A Displaced fracture of shaft of left clavicle, initial encounter for closed fracture: Secondary | ICD-10-CM | POA: Diagnosis not present

## 2020-03-01 DIAGNOSIS — M1811 Unilateral primary osteoarthritis of first carpometacarpal joint, right hand: Secondary | ICD-10-CM | POA: Diagnosis not present

## 2020-03-01 DIAGNOSIS — H3562 Retinal hemorrhage, left eye: Secondary | ICD-10-CM | POA: Diagnosis not present

## 2020-03-01 DIAGNOSIS — R911 Solitary pulmonary nodule: Secondary | ICD-10-CM | POA: Diagnosis not present

## 2020-03-01 DIAGNOSIS — Z0001 Encounter for general adult medical examination with abnormal findings: Secondary | ICD-10-CM | POA: Diagnosis not present

## 2020-03-01 DIAGNOSIS — I5032 Chronic diastolic (congestive) heart failure: Secondary | ICD-10-CM | POA: Diagnosis not present

## 2020-03-01 DIAGNOSIS — I482 Chronic atrial fibrillation, unspecified: Secondary | ICD-10-CM | POA: Diagnosis not present

## 2020-03-01 DIAGNOSIS — L578 Other skin changes due to chronic exposure to nonionizing radiation: Secondary | ICD-10-CM | POA: Diagnosis not present

## 2020-03-01 DIAGNOSIS — E1159 Type 2 diabetes mellitus with other circulatory complications: Secondary | ICD-10-CM | POA: Diagnosis not present

## 2020-03-01 DIAGNOSIS — F3342 Major depressive disorder, recurrent, in full remission: Secondary | ICD-10-CM | POA: Diagnosis not present

## 2020-03-01 DIAGNOSIS — I83893 Varicose veins of bilateral lower extremities with other complications: Secondary | ICD-10-CM | POA: Diagnosis not present

## 2020-03-01 DIAGNOSIS — L719 Rosacea, unspecified: Secondary | ICD-10-CM | POA: Diagnosis not present

## 2020-03-01 DIAGNOSIS — Z9861 Coronary angioplasty status: Secondary | ICD-10-CM | POA: Diagnosis not present

## 2020-03-01 DIAGNOSIS — M10079 Idiopathic gout, unspecified ankle and foot: Secondary | ICD-10-CM | POA: Diagnosis not present

## 2020-03-01 DIAGNOSIS — R0683 Snoring: Secondary | ICD-10-CM | POA: Diagnosis not present

## 2020-03-01 DIAGNOSIS — J302 Other seasonal allergic rhinitis: Secondary | ICD-10-CM | POA: Diagnosis not present

## 2020-03-01 DIAGNOSIS — Z6825 Body mass index (BMI) 25.0-25.9, adult: Secondary | ICD-10-CM | POA: Diagnosis not present

## 2020-03-01 DIAGNOSIS — L299 Pruritus, unspecified: Secondary | ICD-10-CM | POA: Diagnosis not present

## 2020-03-01 DIAGNOSIS — I6529 Occlusion and stenosis of unspecified carotid artery: Secondary | ICD-10-CM | POA: Diagnosis not present

## 2020-03-01 DIAGNOSIS — J449 Chronic obstructive pulmonary disease, unspecified: Secondary | ICD-10-CM | POA: Diagnosis not present

## 2020-03-01 DIAGNOSIS — G893 Neoplasm related pain (acute) (chronic): Secondary | ICD-10-CM | POA: Diagnosis not present

## 2020-03-01 DIAGNOSIS — M48061 Spinal stenosis, lumbar region without neurogenic claudication: Secondary | ICD-10-CM | POA: Diagnosis not present

## 2020-03-01 DIAGNOSIS — H6123 Impacted cerumen, bilateral: Secondary | ICD-10-CM | POA: Diagnosis not present

## 2020-03-01 DIAGNOSIS — C3490 Malignant neoplasm of unspecified part of unspecified bronchus or lung: Secondary | ICD-10-CM | POA: Diagnosis not present

## 2020-03-01 DIAGNOSIS — M859 Disorder of bone density and structure, unspecified: Secondary | ICD-10-CM | POA: Diagnosis not present

## 2020-03-01 DIAGNOSIS — Z9841 Cataract extraction status, right eye: Secondary | ICD-10-CM | POA: Diagnosis not present

## 2020-03-01 DIAGNOSIS — Z48816 Encounter for surgical aftercare following surgery on the genitourinary system: Secondary | ICD-10-CM | POA: Diagnosis not present

## 2020-03-01 DIAGNOSIS — Z7409 Other reduced mobility: Secondary | ICD-10-CM | POA: Diagnosis not present

## 2020-03-01 DIAGNOSIS — S22040A Wedge compression fracture of fourth thoracic vertebra, initial encounter for closed fracture: Secondary | ICD-10-CM | POA: Diagnosis not present

## 2020-03-01 DIAGNOSIS — T8189XA Other complications of procedures, not elsewhere classified, initial encounter: Secondary | ICD-10-CM | POA: Diagnosis not present

## 2020-03-01 DIAGNOSIS — R972 Elevated prostate specific antigen [PSA]: Secondary | ICD-10-CM | POA: Diagnosis not present

## 2020-03-01 DIAGNOSIS — M79604 Pain in right leg: Secondary | ICD-10-CM | POA: Diagnosis not present

## 2020-03-01 DIAGNOSIS — M25551 Pain in right hip: Secondary | ICD-10-CM | POA: Diagnosis not present

## 2020-03-01 DIAGNOSIS — M5412 Radiculopathy, cervical region: Secondary | ICD-10-CM | POA: Diagnosis not present

## 2020-03-01 DIAGNOSIS — H353131 Nonexudative age-related macular degeneration, bilateral, early dry stage: Secondary | ICD-10-CM | POA: Diagnosis not present

## 2020-03-01 DIAGNOSIS — N6322 Unspecified lump in the left breast, upper inner quadrant: Secondary | ICD-10-CM | POA: Diagnosis not present

## 2020-03-01 DIAGNOSIS — Z8616 Personal history of COVID-19: Secondary | ICD-10-CM | POA: Diagnosis not present

## 2020-03-01 DIAGNOSIS — H3530 Unspecified macular degeneration: Secondary | ICD-10-CM | POA: Diagnosis not present

## 2020-03-01 DIAGNOSIS — L820 Inflamed seborrheic keratosis: Secondary | ICD-10-CM | POA: Diagnosis not present

## 2020-03-01 DIAGNOSIS — D1801 Hemangioma of skin and subcutaneous tissue: Secondary | ICD-10-CM | POA: Diagnosis not present

## 2020-03-01 DIAGNOSIS — K5190 Ulcerative colitis, unspecified, without complications: Secondary | ICD-10-CM | POA: Diagnosis not present

## 2020-03-01 DIAGNOSIS — J9690 Respiratory failure, unspecified, unspecified whether with hypoxia or hypercapnia: Secondary | ICD-10-CM | POA: Diagnosis not present

## 2020-03-01 DIAGNOSIS — T85848A Pain due to other internal prosthetic devices, implants and grafts, initial encounter: Secondary | ICD-10-CM | POA: Diagnosis not present

## 2020-03-01 DIAGNOSIS — K648 Other hemorrhoids: Secondary | ICD-10-CM | POA: Diagnosis not present

## 2020-03-01 DIAGNOSIS — H26493 Other secondary cataract, bilateral: Secondary | ICD-10-CM | POA: Diagnosis not present

## 2020-03-01 DIAGNOSIS — G4761 Periodic limb movement disorder: Secondary | ICD-10-CM | POA: Diagnosis not present

## 2020-03-01 DIAGNOSIS — N1831 Chronic kidney disease, stage 3a: Secondary | ICD-10-CM | POA: Diagnosis not present

## 2020-03-01 DIAGNOSIS — R251 Tremor, unspecified: Secondary | ICD-10-CM | POA: Diagnosis not present

## 2020-03-01 DIAGNOSIS — Z96651 Presence of right artificial knee joint: Secondary | ICD-10-CM | POA: Diagnosis not present

## 2020-03-01 DIAGNOSIS — R7401 Elevation of levels of liver transaminase levels: Secondary | ICD-10-CM | POA: Diagnosis not present

## 2020-03-01 DIAGNOSIS — E1140 Type 2 diabetes mellitus with diabetic neuropathy, unspecified: Secondary | ICD-10-CM | POA: Diagnosis not present

## 2020-03-01 DIAGNOSIS — I4510 Unspecified right bundle-branch block: Secondary | ICD-10-CM | POA: Diagnosis not present

## 2020-03-01 DIAGNOSIS — Z720 Tobacco use: Secondary | ICD-10-CM | POA: Diagnosis not present

## 2020-03-01 DIAGNOSIS — R42 Dizziness and giddiness: Secondary | ICD-10-CM | POA: Diagnosis not present

## 2020-03-01 DIAGNOSIS — M2550 Pain in unspecified joint: Secondary | ICD-10-CM | POA: Diagnosis not present

## 2020-03-01 DIAGNOSIS — T148XXA Other injury of unspecified body region, initial encounter: Secondary | ICD-10-CM | POA: Diagnosis not present

## 2020-03-01 DIAGNOSIS — M19071 Primary osteoarthritis, right ankle and foot: Secondary | ICD-10-CM | POA: Diagnosis not present

## 2020-03-01 DIAGNOSIS — M169 Osteoarthritis of hip, unspecified: Secondary | ICD-10-CM | POA: Diagnosis not present

## 2020-03-01 DIAGNOSIS — H18529 Epithelial (juvenile) corneal dystrophy, unspecified eye: Secondary | ICD-10-CM | POA: Diagnosis not present

## 2020-03-01 DIAGNOSIS — Z880 Allergy status to penicillin: Secondary | ICD-10-CM | POA: Diagnosis not present

## 2020-03-01 DIAGNOSIS — N202 Calculus of kidney with calculus of ureter: Secondary | ICD-10-CM | POA: Diagnosis not present

## 2020-03-01 DIAGNOSIS — E11610 Type 2 diabetes mellitus with diabetic neuropathic arthropathy: Secondary | ICD-10-CM | POA: Diagnosis not present

## 2020-03-01 DIAGNOSIS — M6281 Muscle weakness (generalized): Secondary | ICD-10-CM | POA: Diagnosis not present

## 2020-03-01 DIAGNOSIS — D225 Melanocytic nevi of trunk: Secondary | ICD-10-CM | POA: Diagnosis not present

## 2020-03-01 DIAGNOSIS — R001 Bradycardia, unspecified: Secondary | ICD-10-CM | POA: Diagnosis not present

## 2020-03-01 DIAGNOSIS — Z794 Long term (current) use of insulin: Secondary | ICD-10-CM | POA: Diagnosis not present

## 2020-03-01 DIAGNOSIS — F515 Nightmare disorder: Secondary | ICD-10-CM | POA: Diagnosis not present

## 2020-03-01 DIAGNOSIS — H43812 Vitreous degeneration, left eye: Secondary | ICD-10-CM | POA: Diagnosis not present

## 2020-03-01 DIAGNOSIS — D473 Essential (hemorrhagic) thrombocythemia: Secondary | ICD-10-CM | POA: Diagnosis not present

## 2020-03-01 DIAGNOSIS — F418 Other specified anxiety disorders: Secondary | ICD-10-CM | POA: Diagnosis not present

## 2020-03-01 DIAGNOSIS — M5137 Other intervertebral disc degeneration, lumbosacral region: Secondary | ICD-10-CM | POA: Diagnosis not present

## 2020-03-01 DIAGNOSIS — M25471 Effusion, right ankle: Secondary | ICD-10-CM | POA: Diagnosis not present

## 2020-03-01 DIAGNOSIS — K8050 Calculus of bile duct without cholangitis or cholecystitis without obstruction: Secondary | ICD-10-CM | POA: Diagnosis not present

## 2020-03-01 DIAGNOSIS — R1032 Left lower quadrant pain: Secondary | ICD-10-CM | POA: Diagnosis not present

## 2020-03-01 DIAGNOSIS — N138 Other obstructive and reflux uropathy: Secondary | ICD-10-CM | POA: Diagnosis not present

## 2020-03-01 DIAGNOSIS — R531 Weakness: Secondary | ICD-10-CM | POA: Diagnosis not present

## 2020-03-01 DIAGNOSIS — S0990XD Unspecified injury of head, subsequent encounter: Secondary | ICD-10-CM | POA: Diagnosis not present

## 2020-03-01 DIAGNOSIS — C189 Malignant neoplasm of colon, unspecified: Secondary | ICD-10-CM | POA: Diagnosis not present

## 2020-03-01 DIAGNOSIS — R6520 Severe sepsis without septic shock: Secondary | ICD-10-CM | POA: Diagnosis not present

## 2020-03-01 DIAGNOSIS — M25532 Pain in left wrist: Secondary | ICD-10-CM | POA: Diagnosis not present

## 2020-03-01 DIAGNOSIS — C210 Malignant neoplasm of anus, unspecified: Secondary | ICD-10-CM | POA: Diagnosis not present

## 2020-03-01 DIAGNOSIS — I9589 Other hypotension: Secondary | ICD-10-CM | POA: Diagnosis not present

## 2020-03-01 DIAGNOSIS — R0609 Other forms of dyspnea: Secondary | ICD-10-CM | POA: Diagnosis not present

## 2020-03-01 DIAGNOSIS — R0789 Other chest pain: Secondary | ICD-10-CM | POA: Diagnosis not present

## 2020-03-01 DIAGNOSIS — J4520 Mild intermittent asthma, uncomplicated: Secondary | ICD-10-CM | POA: Diagnosis not present

## 2020-03-01 DIAGNOSIS — L97312 Non-pressure chronic ulcer of right ankle with fat layer exposed: Secondary | ICD-10-CM | POA: Diagnosis not present

## 2020-03-01 DIAGNOSIS — R252 Cramp and spasm: Secondary | ICD-10-CM | POA: Diagnosis not present

## 2020-03-01 DIAGNOSIS — Z801 Family history of malignant neoplasm of trachea, bronchus and lung: Secondary | ICD-10-CM | POA: Diagnosis not present

## 2020-03-01 DIAGNOSIS — R450 Nervousness: Secondary | ICD-10-CM | POA: Diagnosis not present

## 2020-03-01 DIAGNOSIS — G309 Alzheimer's disease, unspecified: Secondary | ICD-10-CM | POA: Diagnosis not present

## 2020-03-01 DIAGNOSIS — M2560 Stiffness of unspecified joint, not elsewhere classified: Secondary | ICD-10-CM | POA: Diagnosis not present

## 2020-03-01 DIAGNOSIS — F324 Major depressive disorder, single episode, in partial remission: Secondary | ICD-10-CM | POA: Diagnosis not present

## 2020-03-01 DIAGNOSIS — R5382 Chronic fatigue, unspecified: Secondary | ICD-10-CM | POA: Diagnosis not present

## 2020-03-01 DIAGNOSIS — M533 Sacrococcygeal disorders, not elsewhere classified: Secondary | ICD-10-CM | POA: Diagnosis not present

## 2020-03-01 DIAGNOSIS — F5101 Primary insomnia: Secondary | ICD-10-CM | POA: Diagnosis not present

## 2020-03-01 DIAGNOSIS — G620 Drug-induced polyneuropathy: Secondary | ICD-10-CM | POA: Diagnosis not present

## 2020-03-01 DIAGNOSIS — M47817 Spondylosis without myelopathy or radiculopathy, lumbosacral region: Secondary | ICD-10-CM | POA: Diagnosis not present

## 2020-03-01 DIAGNOSIS — M0579 Rheumatoid arthritis with rheumatoid factor of multiple sites without organ or systems involvement: Secondary | ICD-10-CM | POA: Diagnosis not present

## 2020-03-01 DIAGNOSIS — M5415 Radiculopathy, thoracolumbar region: Secondary | ICD-10-CM | POA: Diagnosis not present

## 2020-03-01 DIAGNOSIS — I82409 Acute embolism and thrombosis of unspecified deep veins of unspecified lower extremity: Secondary | ICD-10-CM | POA: Diagnosis not present

## 2020-03-01 DIAGNOSIS — I25119 Atherosclerotic heart disease of native coronary artery with unspecified angina pectoris: Secondary | ICD-10-CM | POA: Diagnosis not present

## 2020-03-01 DIAGNOSIS — J9601 Acute respiratory failure with hypoxia: Secondary | ICD-10-CM | POA: Diagnosis not present

## 2020-03-01 DIAGNOSIS — Z8673 Personal history of transient ischemic attack (TIA), and cerebral infarction without residual deficits: Secondary | ICD-10-CM | POA: Diagnosis not present

## 2020-03-01 DIAGNOSIS — H903 Sensorineural hearing loss, bilateral: Secondary | ICD-10-CM | POA: Diagnosis not present

## 2020-03-01 DIAGNOSIS — R4182 Altered mental status, unspecified: Secondary | ICD-10-CM | POA: Diagnosis not present

## 2020-03-01 DIAGNOSIS — I110 Hypertensive heart disease with heart failure: Secondary | ICD-10-CM | POA: Diagnosis not present

## 2020-03-01 DIAGNOSIS — H26491 Other secondary cataract, right eye: Secondary | ICD-10-CM | POA: Diagnosis not present

## 2020-03-01 DIAGNOSIS — E7849 Other hyperlipidemia: Secondary | ICD-10-CM | POA: Diagnosis not present

## 2020-03-01 DIAGNOSIS — F0150 Vascular dementia without behavioral disturbance: Secondary | ICD-10-CM | POA: Diagnosis not present

## 2020-03-01 DIAGNOSIS — Z86010 Personal history of colonic polyps: Secondary | ICD-10-CM | POA: Diagnosis not present

## 2020-03-01 DIAGNOSIS — R350 Frequency of micturition: Secondary | ICD-10-CM | POA: Diagnosis not present

## 2020-03-01 DIAGNOSIS — J440 Chronic obstructive pulmonary disease with acute lower respiratory infection: Secondary | ICD-10-CM | POA: Diagnosis not present

## 2020-03-01 DIAGNOSIS — H34832 Tributary (branch) retinal vein occlusion, left eye, with macular edema: Secondary | ICD-10-CM | POA: Diagnosis not present

## 2020-03-01 DIAGNOSIS — M25511 Pain in right shoulder: Secondary | ICD-10-CM | POA: Diagnosis not present

## 2020-03-01 DIAGNOSIS — L89320 Pressure ulcer of left buttock, unstageable: Secondary | ICD-10-CM | POA: Diagnosis not present

## 2020-03-01 DIAGNOSIS — R278 Other lack of coordination: Secondary | ICD-10-CM | POA: Diagnosis not present

## 2020-03-01 DIAGNOSIS — Z01810 Encounter for preprocedural cardiovascular examination: Secondary | ICD-10-CM | POA: Diagnosis not present

## 2020-03-01 DIAGNOSIS — J40 Bronchitis, not specified as acute or chronic: Secondary | ICD-10-CM | POA: Diagnosis not present

## 2020-03-01 DIAGNOSIS — R262 Difficulty in walking, not elsewhere classified: Secondary | ICD-10-CM | POA: Diagnosis not present

## 2020-03-01 DIAGNOSIS — Z7901 Long term (current) use of anticoagulants: Secondary | ICD-10-CM | POA: Diagnosis not present

## 2020-03-01 DIAGNOSIS — Z6830 Body mass index (BMI) 30.0-30.9, adult: Secondary | ICD-10-CM | POA: Diagnosis not present

## 2020-03-01 DIAGNOSIS — H90A31 Mixed conductive and sensorineural hearing loss, unilateral, right ear with restricted hearing on the contralateral side: Secondary | ICD-10-CM | POA: Diagnosis not present

## 2020-03-01 DIAGNOSIS — D0472 Carcinoma in situ of skin of left lower limb, including hip: Secondary | ICD-10-CM | POA: Diagnosis not present

## 2020-03-01 DIAGNOSIS — T300 Burn of unspecified body region, unspecified degree: Secondary | ICD-10-CM | POA: Diagnosis not present

## 2020-03-01 DIAGNOSIS — K2270 Barrett's esophagus without dysplasia: Secondary | ICD-10-CM | POA: Diagnosis not present

## 2020-03-01 DIAGNOSIS — F3131 Bipolar disorder, current episode depressed, mild: Secondary | ICD-10-CM | POA: Diagnosis not present

## 2020-03-01 DIAGNOSIS — J4541 Moderate persistent asthma with (acute) exacerbation: Secondary | ICD-10-CM | POA: Diagnosis not present

## 2020-03-01 DIAGNOSIS — N420 Calculus of prostate: Secondary | ICD-10-CM | POA: Diagnosis not present

## 2020-03-01 DIAGNOSIS — S63125D Dislocation of unspecified interphalangeal joint of left thumb, subsequent encounter: Secondary | ICD-10-CM | POA: Diagnosis not present

## 2020-03-01 DIAGNOSIS — J4530 Mild persistent asthma, uncomplicated: Secondary | ICD-10-CM | POA: Diagnosis not present

## 2020-03-01 DIAGNOSIS — H02883 Meibomian gland dysfunction of right eye, unspecified eyelid: Secondary | ICD-10-CM | POA: Diagnosis not present

## 2020-03-01 DIAGNOSIS — R221 Localized swelling, mass and lump, neck: Secondary | ICD-10-CM | POA: Diagnosis not present

## 2020-03-01 DIAGNOSIS — H33302 Unspecified retinal break, left eye: Secondary | ICD-10-CM | POA: Diagnosis not present

## 2020-03-01 DIAGNOSIS — M792 Neuralgia and neuritis, unspecified: Secondary | ICD-10-CM | POA: Diagnosis not present

## 2020-03-01 DIAGNOSIS — M779 Enthesopathy, unspecified: Secondary | ICD-10-CM | POA: Diagnosis not present

## 2020-03-01 DIAGNOSIS — M25411 Effusion, right shoulder: Secondary | ICD-10-CM | POA: Diagnosis not present

## 2020-03-01 DIAGNOSIS — M5431 Sciatica, right side: Secondary | ICD-10-CM | POA: Diagnosis not present

## 2020-03-01 DIAGNOSIS — R413 Other amnesia: Secondary | ICD-10-CM | POA: Diagnosis not present

## 2020-03-01 DIAGNOSIS — M47896 Other spondylosis, lumbar region: Secondary | ICD-10-CM | POA: Diagnosis not present

## 2020-03-01 DIAGNOSIS — N2581 Secondary hyperparathyroidism of renal origin: Secondary | ICD-10-CM | POA: Diagnosis not present

## 2020-03-01 DIAGNOSIS — K52831 Collagenous colitis: Secondary | ICD-10-CM | POA: Diagnosis not present

## 2020-03-01 DIAGNOSIS — R61 Generalized hyperhidrosis: Secondary | ICD-10-CM | POA: Diagnosis not present

## 2020-03-01 DIAGNOSIS — D123 Benign neoplasm of transverse colon: Secondary | ICD-10-CM | POA: Diagnosis not present

## 2020-03-01 DIAGNOSIS — G5752 Tarsal tunnel syndrome, left lower limb: Secondary | ICD-10-CM | POA: Diagnosis not present

## 2020-03-01 DIAGNOSIS — I82462 Acute embolism and thrombosis of left calf muscular vein: Secondary | ICD-10-CM | POA: Diagnosis not present

## 2020-03-01 DIAGNOSIS — J0121 Acute recurrent ethmoidal sinusitis: Secondary | ICD-10-CM | POA: Diagnosis not present

## 2020-03-01 DIAGNOSIS — N320 Bladder-neck obstruction: Secondary | ICD-10-CM | POA: Diagnosis not present

## 2020-03-01 DIAGNOSIS — H3589 Other specified retinal disorders: Secondary | ICD-10-CM | POA: Diagnosis not present

## 2020-03-01 DIAGNOSIS — I1310 Hypertensive heart and chronic kidney disease without heart failure, with stage 1 through stage 4 chronic kidney disease, or unspecified chronic kidney disease: Secondary | ICD-10-CM | POA: Diagnosis not present

## 2020-03-01 DIAGNOSIS — Z952 Presence of prosthetic heart valve: Secondary | ICD-10-CM | POA: Diagnosis not present

## 2020-03-01 DIAGNOSIS — Z950 Presence of cardiac pacemaker: Secondary | ICD-10-CM | POA: Diagnosis not present

## 2020-03-01 DIAGNOSIS — N183 Chronic kidney disease, stage 3 unspecified: Secondary | ICD-10-CM | POA: Diagnosis not present

## 2020-03-01 DIAGNOSIS — G5603 Carpal tunnel syndrome, bilateral upper limbs: Secondary | ICD-10-CM | POA: Diagnosis not present

## 2020-03-01 DIAGNOSIS — F325 Major depressive disorder, single episode, in full remission: Secondary | ICD-10-CM | POA: Diagnosis not present

## 2020-03-01 DIAGNOSIS — E119 Type 2 diabetes mellitus without complications: Secondary | ICD-10-CM | POA: Diagnosis not present

## 2020-03-01 DIAGNOSIS — H348322 Tributary (branch) retinal vein occlusion, left eye, stable: Secondary | ICD-10-CM | POA: Diagnosis not present

## 2020-03-01 DIAGNOSIS — D2271 Melanocytic nevi of right lower limb, including hip: Secondary | ICD-10-CM | POA: Diagnosis not present

## 2020-03-01 DIAGNOSIS — E11319 Type 2 diabetes mellitus with unspecified diabetic retinopathy without macular edema: Secondary | ICD-10-CM | POA: Diagnosis not present

## 2020-03-01 DIAGNOSIS — M8000XD Age-related osteoporosis with current pathological fracture, unspecified site, subsequent encounter for fracture with routine healing: Secondary | ICD-10-CM | POA: Diagnosis not present

## 2020-03-01 DIAGNOSIS — M25572 Pain in left ankle and joints of left foot: Secondary | ICD-10-CM | POA: Diagnosis not present

## 2020-03-01 DIAGNOSIS — M4326 Fusion of spine, lumbar region: Secondary | ICD-10-CM | POA: Diagnosis not present

## 2020-03-01 DIAGNOSIS — N210 Calculus in bladder: Secondary | ICD-10-CM | POA: Diagnosis not present

## 2020-03-01 DIAGNOSIS — K760 Fatty (change of) liver, not elsewhere classified: Secondary | ICD-10-CM | POA: Diagnosis not present

## 2020-03-01 DIAGNOSIS — G500 Trigeminal neuralgia: Secondary | ICD-10-CM | POA: Diagnosis not present

## 2020-03-01 DIAGNOSIS — T1501XA Foreign body in cornea, right eye, initial encounter: Secondary | ICD-10-CM | POA: Diagnosis not present

## 2020-03-01 DIAGNOSIS — Z8501 Personal history of malignant neoplasm of esophagus: Secondary | ICD-10-CM | POA: Diagnosis not present

## 2020-03-01 DIAGNOSIS — Z789 Other specified health status: Secondary | ICD-10-CM | POA: Diagnosis not present

## 2020-03-01 DIAGNOSIS — M419 Scoliosis, unspecified: Secondary | ICD-10-CM | POA: Diagnosis not present

## 2020-03-01 DIAGNOSIS — M461 Sacroiliitis, not elsewhere classified: Secondary | ICD-10-CM | POA: Diagnosis not present

## 2020-03-01 DIAGNOSIS — I422 Other hypertrophic cardiomyopathy: Secondary | ICD-10-CM | POA: Diagnosis not present

## 2020-03-01 DIAGNOSIS — M4807 Spinal stenosis, lumbosacral region: Secondary | ICD-10-CM | POA: Diagnosis not present

## 2020-03-01 DIAGNOSIS — M40204 Unspecified kyphosis, thoracic region: Secondary | ICD-10-CM | POA: Diagnosis not present

## 2020-03-01 DIAGNOSIS — H1820 Unspecified corneal edema: Secondary | ICD-10-CM | POA: Diagnosis not present

## 2020-03-01 DIAGNOSIS — Z006 Encounter for examination for normal comparison and control in clinical research program: Secondary | ICD-10-CM | POA: Diagnosis not present

## 2020-03-01 DIAGNOSIS — R338 Other retention of urine: Secondary | ICD-10-CM | POA: Diagnosis not present

## 2020-03-01 DIAGNOSIS — G250 Essential tremor: Secondary | ICD-10-CM | POA: Diagnosis not present

## 2020-03-01 DIAGNOSIS — I129 Hypertensive chronic kidney disease with stage 1 through stage 4 chronic kidney disease, or unspecified chronic kidney disease: Secondary | ICD-10-CM | POA: Diagnosis not present

## 2020-03-01 DIAGNOSIS — B353 Tinea pedis: Secondary | ICD-10-CM | POA: Diagnosis not present

## 2020-03-01 DIAGNOSIS — F1190 Opioid use, unspecified, uncomplicated: Secondary | ICD-10-CM | POA: Diagnosis not present

## 2020-03-01 DIAGNOSIS — L97921 Non-pressure chronic ulcer of unspecified part of left lower leg limited to breakdown of skin: Secondary | ICD-10-CM | POA: Diagnosis not present

## 2020-03-01 DIAGNOSIS — H25813 Combined forms of age-related cataract, bilateral: Secondary | ICD-10-CM | POA: Diagnosis not present

## 2020-03-01 DIAGNOSIS — S199XXA Unspecified injury of neck, initial encounter: Secondary | ICD-10-CM | POA: Diagnosis not present

## 2020-03-01 DIAGNOSIS — H353111 Nonexudative age-related macular degeneration, right eye, early dry stage: Secondary | ICD-10-CM | POA: Diagnosis not present

## 2020-03-01 DIAGNOSIS — C155 Malignant neoplasm of lower third of esophagus: Secondary | ICD-10-CM | POA: Diagnosis not present

## 2020-03-01 DIAGNOSIS — M1611 Unilateral primary osteoarthritis, right hip: Secondary | ICD-10-CM | POA: Diagnosis not present

## 2020-03-01 DIAGNOSIS — F1120 Opioid dependence, uncomplicated: Secondary | ICD-10-CM | POA: Diagnosis not present

## 2020-03-01 DIAGNOSIS — M6282 Rhabdomyolysis: Secondary | ICD-10-CM | POA: Diagnosis not present

## 2020-03-01 DIAGNOSIS — Z7689 Persons encountering health services in other specified circumstances: Secondary | ICD-10-CM | POA: Diagnosis not present

## 2020-03-01 DIAGNOSIS — G20 Parkinson's disease: Secondary | ICD-10-CM | POA: Diagnosis not present

## 2020-03-01 DIAGNOSIS — I059 Rheumatic mitral valve disease, unspecified: Secondary | ICD-10-CM | POA: Diagnosis not present

## 2020-03-01 DIAGNOSIS — F4310 Post-traumatic stress disorder, unspecified: Secondary | ICD-10-CM | POA: Diagnosis not present

## 2020-03-01 DIAGNOSIS — S6992XA Unspecified injury of left wrist, hand and finger(s), initial encounter: Secondary | ICD-10-CM | POA: Diagnosis not present

## 2020-03-01 DIAGNOSIS — F39 Unspecified mood [affective] disorder: Secondary | ICD-10-CM | POA: Diagnosis not present

## 2020-03-01 DIAGNOSIS — H353132 Nonexudative age-related macular degeneration, bilateral, intermediate dry stage: Secondary | ICD-10-CM | POA: Diagnosis not present

## 2020-03-01 DIAGNOSIS — D509 Iron deficiency anemia, unspecified: Secondary | ICD-10-CM | POA: Diagnosis not present

## 2020-03-01 DIAGNOSIS — S134XXA Sprain of ligaments of cervical spine, initial encounter: Secondary | ICD-10-CM | POA: Diagnosis not present

## 2020-03-01 DIAGNOSIS — I50812 Chronic right heart failure: Secondary | ICD-10-CM | POA: Diagnosis not present

## 2020-03-01 DIAGNOSIS — M7751 Other enthesopathy of right foot: Secondary | ICD-10-CM | POA: Diagnosis not present

## 2020-03-01 DIAGNOSIS — F430 Acute stress reaction: Secondary | ICD-10-CM | POA: Diagnosis not present

## 2020-03-01 DIAGNOSIS — I2699 Other pulmonary embolism without acute cor pulmonale: Secondary | ICD-10-CM | POA: Diagnosis not present

## 2020-03-01 DIAGNOSIS — H53483 Generalized contraction of visual field, bilateral: Secondary | ICD-10-CM | POA: Diagnosis not present

## 2020-03-01 DIAGNOSIS — H348312 Tributary (branch) retinal vein occlusion, right eye, stable: Secondary | ICD-10-CM | POA: Diagnosis not present

## 2020-03-01 DIAGNOSIS — M79652 Pain in left thigh: Secondary | ICD-10-CM | POA: Diagnosis not present

## 2020-03-01 DIAGNOSIS — D6869 Other thrombophilia: Secondary | ICD-10-CM | POA: Diagnosis not present

## 2020-03-01 DIAGNOSIS — E872 Acidosis: Secondary | ICD-10-CM | POA: Diagnosis not present

## 2020-03-01 DIAGNOSIS — L603 Nail dystrophy: Secondary | ICD-10-CM | POA: Diagnosis not present

## 2020-03-01 DIAGNOSIS — N6489 Other specified disorders of breast: Secondary | ICD-10-CM | POA: Diagnosis not present

## 2020-03-01 DIAGNOSIS — H8109 Meniere's disease, unspecified ear: Secondary | ICD-10-CM | POA: Diagnosis not present

## 2020-03-01 DIAGNOSIS — R0902 Hypoxemia: Secondary | ICD-10-CM | POA: Diagnosis not present

## 2020-03-01 DIAGNOSIS — D539 Nutritional anemia, unspecified: Secondary | ICD-10-CM | POA: Diagnosis not present

## 2020-03-01 DIAGNOSIS — E871 Hypo-osmolality and hyponatremia: Secondary | ICD-10-CM | POA: Diagnosis not present

## 2020-03-01 DIAGNOSIS — F0280 Dementia in other diseases classified elsewhere without behavioral disturbance: Secondary | ICD-10-CM | POA: Diagnosis not present

## 2020-03-01 DIAGNOSIS — I519 Heart disease, unspecified: Secondary | ICD-10-CM | POA: Diagnosis not present

## 2020-03-01 DIAGNOSIS — H25011 Cortical age-related cataract, right eye: Secondary | ICD-10-CM | POA: Diagnosis not present

## 2020-03-01 DIAGNOSIS — M2241 Chondromalacia patellae, right knee: Secondary | ICD-10-CM | POA: Diagnosis not present

## 2020-03-01 DIAGNOSIS — N816 Rectocele: Secondary | ICD-10-CM | POA: Diagnosis not present

## 2020-03-01 DIAGNOSIS — L89154 Pressure ulcer of sacral region, stage 4: Secondary | ICD-10-CM | POA: Diagnosis not present

## 2020-03-01 DIAGNOSIS — J189 Pneumonia, unspecified organism: Secondary | ICD-10-CM | POA: Diagnosis not present

## 2020-03-01 DIAGNOSIS — Z780 Asymptomatic menopausal state: Secondary | ICD-10-CM | POA: Diagnosis not present

## 2020-03-01 DIAGNOSIS — M79671 Pain in right foot: Secondary | ICD-10-CM | POA: Diagnosis not present

## 2020-03-01 DIAGNOSIS — S3991XA Unspecified injury of abdomen, initial encounter: Secondary | ICD-10-CM | POA: Diagnosis not present

## 2020-03-01 DIAGNOSIS — R0682 Tachypnea, not elsewhere classified: Secondary | ICD-10-CM | POA: Diagnosis not present

## 2020-03-01 DIAGNOSIS — G43909 Migraine, unspecified, not intractable, without status migrainosus: Secondary | ICD-10-CM | POA: Diagnosis not present

## 2020-03-01 DIAGNOSIS — S90111A Contusion of right great toe without damage to nail, initial encounter: Secondary | ICD-10-CM | POA: Diagnosis not present

## 2020-03-01 DIAGNOSIS — I5030 Unspecified diastolic (congestive) heart failure: Secondary | ICD-10-CM | POA: Diagnosis not present

## 2020-03-01 DIAGNOSIS — R0989 Other specified symptoms and signs involving the circulatory and respiratory systems: Secondary | ICD-10-CM | POA: Diagnosis not present

## 2020-03-01 DIAGNOSIS — J181 Lobar pneumonia, unspecified organism: Secondary | ICD-10-CM | POA: Diagnosis not present

## 2020-03-01 DIAGNOSIS — E519 Thiamine deficiency, unspecified: Secondary | ICD-10-CM | POA: Diagnosis not present

## 2020-03-01 DIAGNOSIS — J8410 Pulmonary fibrosis, unspecified: Secondary | ICD-10-CM | POA: Diagnosis not present

## 2020-03-01 DIAGNOSIS — G894 Chronic pain syndrome: Secondary | ICD-10-CM | POA: Diagnosis not present

## 2020-03-01 DIAGNOSIS — R200 Anesthesia of skin: Secondary | ICD-10-CM | POA: Diagnosis not present

## 2020-03-01 DIAGNOSIS — M19041 Primary osteoarthritis, right hand: Secondary | ICD-10-CM | POA: Diagnosis not present

## 2020-03-01 DIAGNOSIS — R296 Repeated falls: Secondary | ICD-10-CM | POA: Diagnosis not present

## 2020-03-01 DIAGNOSIS — Z6831 Body mass index (BMI) 31.0-31.9, adult: Secondary | ICD-10-CM | POA: Diagnosis not present

## 2020-03-01 DIAGNOSIS — D4989 Neoplasm of unspecified behavior of other specified sites: Secondary | ICD-10-CM | POA: Diagnosis not present

## 2020-03-01 DIAGNOSIS — E1142 Type 2 diabetes mellitus with diabetic polyneuropathy: Secondary | ICD-10-CM | POA: Diagnosis not present

## 2020-03-01 DIAGNOSIS — C031 Malignant neoplasm of lower gum: Secondary | ICD-10-CM | POA: Diagnosis not present

## 2020-03-01 DIAGNOSIS — H35033 Hypertensive retinopathy, bilateral: Secondary | ICD-10-CM | POA: Diagnosis not present

## 2020-03-01 DIAGNOSIS — H52223 Regular astigmatism, bilateral: Secondary | ICD-10-CM | POA: Diagnosis not present

## 2020-03-01 DIAGNOSIS — Z6824 Body mass index (BMI) 24.0-24.9, adult: Secondary | ICD-10-CM | POA: Diagnosis not present

## 2020-03-01 DIAGNOSIS — R079 Chest pain, unspecified: Secondary | ICD-10-CM | POA: Diagnosis not present

## 2020-03-01 DIAGNOSIS — Z85118 Personal history of other malignant neoplasm of bronchus and lung: Secondary | ICD-10-CM | POA: Diagnosis not present

## 2020-03-01 DIAGNOSIS — M3500 Sicca syndrome, unspecified: Secondary | ICD-10-CM | POA: Diagnosis not present

## 2020-03-01 DIAGNOSIS — Z6841 Body Mass Index (BMI) 40.0 and over, adult: Secondary | ICD-10-CM | POA: Diagnosis not present

## 2020-03-01 DIAGNOSIS — K649 Unspecified hemorrhoids: Secondary | ICD-10-CM | POA: Diagnosis not present

## 2020-03-01 DIAGNOSIS — B372 Candidiasis of skin and nail: Secondary | ICD-10-CM | POA: Diagnosis not present

## 2020-03-01 DIAGNOSIS — D696 Thrombocytopenia, unspecified: Secondary | ICD-10-CM | POA: Diagnosis not present

## 2020-03-01 DIAGNOSIS — G301 Alzheimer's disease with late onset: Secondary | ICD-10-CM | POA: Diagnosis not present

## 2020-03-01 DIAGNOSIS — Z951 Presence of aortocoronary bypass graft: Secondary | ICD-10-CM | POA: Diagnosis not present

## 2020-03-01 DIAGNOSIS — M65342 Trigger finger, left ring finger: Secondary | ICD-10-CM | POA: Diagnosis not present

## 2020-03-01 DIAGNOSIS — R9431 Abnormal electrocardiogram [ECG] [EKG]: Secondary | ICD-10-CM | POA: Diagnosis not present

## 2020-03-01 DIAGNOSIS — J441 Chronic obstructive pulmonary disease with (acute) exacerbation: Secondary | ICD-10-CM | POA: Diagnosis not present

## 2020-03-01 DIAGNOSIS — I6523 Occlusion and stenosis of bilateral carotid arteries: Secondary | ICD-10-CM | POA: Diagnosis not present

## 2020-03-01 DIAGNOSIS — S4992XA Unspecified injury of left shoulder and upper arm, initial encounter: Secondary | ICD-10-CM | POA: Diagnosis not present

## 2020-03-01 DIAGNOSIS — Z01419 Encounter for gynecological examination (general) (routine) without abnormal findings: Secondary | ICD-10-CM | POA: Diagnosis not present

## 2020-03-01 DIAGNOSIS — M67441 Ganglion, right hand: Secondary | ICD-10-CM | POA: Diagnosis not present

## 2020-03-01 DIAGNOSIS — H353223 Exudative age-related macular degeneration, left eye, with inactive scar: Secondary | ICD-10-CM | POA: Diagnosis not present

## 2020-03-01 DIAGNOSIS — E538 Deficiency of other specified B group vitamins: Secondary | ICD-10-CM | POA: Diagnosis not present

## 2020-03-01 DIAGNOSIS — I248 Other forms of acute ischemic heart disease: Secondary | ICD-10-CM | POA: Diagnosis not present

## 2020-03-01 DIAGNOSIS — T451X5A Adverse effect of antineoplastic and immunosuppressive drugs, initial encounter: Secondary | ICD-10-CM | POA: Diagnosis not present

## 2020-03-01 DIAGNOSIS — Z881 Allergy status to other antibiotic agents status: Secondary | ICD-10-CM | POA: Diagnosis not present

## 2020-03-01 DIAGNOSIS — H5203 Hypermetropia, bilateral: Secondary | ICD-10-CM | POA: Diagnosis not present

## 2020-03-01 DIAGNOSIS — H34831 Tributary (branch) retinal vein occlusion, right eye, with macular edema: Secondary | ICD-10-CM | POA: Diagnosis not present

## 2020-03-01 DIAGNOSIS — H401123 Primary open-angle glaucoma, left eye, severe stage: Secondary | ICD-10-CM | POA: Diagnosis not present

## 2020-03-01 DIAGNOSIS — R198 Other specified symptoms and signs involving the digestive system and abdomen: Secondary | ICD-10-CM | POA: Diagnosis not present

## 2020-03-01 DIAGNOSIS — K5732 Diverticulitis of large intestine without perforation or abscess without bleeding: Secondary | ICD-10-CM | POA: Diagnosis not present

## 2020-03-01 DIAGNOSIS — I509 Heart failure, unspecified: Secondary | ICD-10-CM | POA: Diagnosis not present

## 2020-03-01 DIAGNOSIS — E1065 Type 1 diabetes mellitus with hyperglycemia: Secondary | ICD-10-CM | POA: Diagnosis not present

## 2020-03-01 DIAGNOSIS — M7062 Trochanteric bursitis, left hip: Secondary | ICD-10-CM | POA: Diagnosis not present

## 2020-03-01 DIAGNOSIS — I441 Atrioventricular block, second degree: Secondary | ICD-10-CM | POA: Diagnosis not present

## 2020-03-01 DIAGNOSIS — K8689 Other specified diseases of pancreas: Secondary | ICD-10-CM | POA: Diagnosis not present

## 2020-03-01 DIAGNOSIS — Z96612 Presence of left artificial shoulder joint: Secondary | ICD-10-CM | POA: Diagnosis not present

## 2020-03-01 DIAGNOSIS — M7502 Adhesive capsulitis of left shoulder: Secondary | ICD-10-CM | POA: Diagnosis not present

## 2020-03-01 DIAGNOSIS — Z6833 Body mass index (BMI) 33.0-33.9, adult: Secondary | ICD-10-CM | POA: Diagnosis not present

## 2020-03-01 DIAGNOSIS — M4316 Spondylolisthesis, lumbar region: Secondary | ICD-10-CM | POA: Diagnosis not present

## 2020-03-01 DIAGNOSIS — I8510 Secondary esophageal varices without bleeding: Secondary | ICD-10-CM | POA: Diagnosis not present

## 2020-03-01 DIAGNOSIS — N261 Atrophy of kidney (terminal): Secondary | ICD-10-CM | POA: Diagnosis not present

## 2020-03-01 DIAGNOSIS — F411 Generalized anxiety disorder: Secondary | ICD-10-CM | POA: Diagnosis not present

## 2020-03-01 DIAGNOSIS — D2262 Melanocytic nevi of left upper limb, including shoulder: Secondary | ICD-10-CM | POA: Diagnosis not present

## 2020-03-01 DIAGNOSIS — I708 Atherosclerosis of other arteries: Secondary | ICD-10-CM | POA: Diagnosis not present

## 2020-03-01 DIAGNOSIS — T3177 Burns involving 70-79% of body surface with 70-79% third degree burns: Secondary | ICD-10-CM | POA: Diagnosis not present

## 2020-03-01 DIAGNOSIS — R195 Other fecal abnormalities: Secondary | ICD-10-CM | POA: Diagnosis not present

## 2020-03-01 DIAGNOSIS — M180 Bilateral primary osteoarthritis of first carpometacarpal joints: Secondary | ICD-10-CM | POA: Diagnosis not present

## 2020-03-01 DIAGNOSIS — E531 Pyridoxine deficiency: Secondary | ICD-10-CM | POA: Diagnosis not present

## 2020-03-01 DIAGNOSIS — R29898 Other symptoms and signs involving the musculoskeletal system: Secondary | ICD-10-CM | POA: Diagnosis not present

## 2020-03-01 DIAGNOSIS — N959 Unspecified menopausal and perimenopausal disorder: Secondary | ICD-10-CM | POA: Diagnosis not present

## 2020-03-01 DIAGNOSIS — D124 Benign neoplasm of descending colon: Secondary | ICD-10-CM | POA: Diagnosis not present

## 2020-03-01 DIAGNOSIS — Z6835 Body mass index (BMI) 35.0-35.9, adult: Secondary | ICD-10-CM | POA: Diagnosis not present

## 2020-03-01 DIAGNOSIS — R000 Tachycardia, unspecified: Secondary | ICD-10-CM | POA: Diagnosis not present

## 2020-03-01 DIAGNOSIS — F339 Major depressive disorder, recurrent, unspecified: Secondary | ICD-10-CM | POA: Diagnosis not present

## 2020-03-01 DIAGNOSIS — M47894 Other spondylosis, thoracic region: Secondary | ICD-10-CM | POA: Diagnosis not present

## 2020-03-01 DIAGNOSIS — I499 Cardiac arrhythmia, unspecified: Secondary | ICD-10-CM | POA: Diagnosis not present

## 2020-03-01 DIAGNOSIS — E663 Overweight: Secondary | ICD-10-CM | POA: Diagnosis not present

## 2020-03-01 DIAGNOSIS — M4604 Spinal enthesopathy, thoracic region: Secondary | ICD-10-CM | POA: Diagnosis not present

## 2020-03-01 DIAGNOSIS — E1151 Type 2 diabetes mellitus with diabetic peripheral angiopathy without gangrene: Secondary | ICD-10-CM | POA: Diagnosis not present

## 2020-03-01 DIAGNOSIS — S81801A Unspecified open wound, right lower leg, initial encounter: Secondary | ICD-10-CM | POA: Diagnosis not present

## 2020-03-01 DIAGNOSIS — G8911 Acute pain due to trauma: Secondary | ICD-10-CM | POA: Diagnosis not present

## 2020-03-01 DIAGNOSIS — Z125 Encounter for screening for malignant neoplasm of prostate: Secondary | ICD-10-CM | POA: Diagnosis not present

## 2020-03-01 DIAGNOSIS — K5289 Other specified noninfective gastroenteritis and colitis: Secondary | ICD-10-CM | POA: Diagnosis not present

## 2020-03-01 DIAGNOSIS — E113511 Type 2 diabetes mellitus with proliferative diabetic retinopathy with macular edema, right eye: Secondary | ICD-10-CM | POA: Diagnosis not present

## 2020-03-01 DIAGNOSIS — M5386 Other specified dorsopathies, lumbar region: Secondary | ICD-10-CM | POA: Diagnosis not present

## 2020-03-01 DIAGNOSIS — Z9911 Dependence on respirator [ventilator] status: Secondary | ICD-10-CM | POA: Diagnosis not present

## 2020-03-01 DIAGNOSIS — I712 Thoracic aortic aneurysm, without rupture: Secondary | ICD-10-CM | POA: Diagnosis not present

## 2020-03-01 DIAGNOSIS — N182 Chronic kidney disease, stage 2 (mild): Secondary | ICD-10-CM | POA: Diagnosis not present

## 2020-03-01 DIAGNOSIS — Z959 Presence of cardiac and vascular implant and graft, unspecified: Secondary | ICD-10-CM | POA: Diagnosis not present

## 2020-03-01 DIAGNOSIS — Z4682 Encounter for fitting and adjustment of non-vascular catheter: Secondary | ICD-10-CM | POA: Diagnosis not present

## 2020-03-01 DIAGNOSIS — C44722 Squamous cell carcinoma of skin of right lower limb, including hip: Secondary | ICD-10-CM | POA: Diagnosis not present

## 2020-03-01 DIAGNOSIS — I714 Abdominal aortic aneurysm, without rupture: Secondary | ICD-10-CM | POA: Diagnosis not present

## 2020-03-01 DIAGNOSIS — M26601 Right temporomandibular joint disorder, unspecified: Secondary | ICD-10-CM | POA: Diagnosis not present

## 2020-03-01 DIAGNOSIS — H5030 Unspecified intermittent heterotropia: Secondary | ICD-10-CM | POA: Diagnosis not present

## 2020-03-01 DIAGNOSIS — S99921A Unspecified injury of right foot, initial encounter: Secondary | ICD-10-CM | POA: Diagnosis not present

## 2020-03-01 DIAGNOSIS — F322 Major depressive disorder, single episode, severe without psychotic features: Secondary | ICD-10-CM | POA: Diagnosis not present

## 2020-03-01 DIAGNOSIS — Z7189 Other specified counseling: Secondary | ICD-10-CM | POA: Diagnosis not present

## 2020-03-01 DIAGNOSIS — Z825 Family history of asthma and other chronic lower respiratory diseases: Secondary | ICD-10-CM | POA: Diagnosis not present

## 2020-03-01 DIAGNOSIS — E11621 Type 2 diabetes mellitus with foot ulcer: Secondary | ICD-10-CM | POA: Diagnosis not present

## 2020-03-01 DIAGNOSIS — K5901 Slow transit constipation: Secondary | ICD-10-CM | POA: Diagnosis not present

## 2020-03-01 DIAGNOSIS — L22 Diaper dermatitis: Secondary | ICD-10-CM | POA: Diagnosis not present

## 2020-03-01 DIAGNOSIS — N50812 Left testicular pain: Secondary | ICD-10-CM | POA: Diagnosis not present

## 2020-03-01 DIAGNOSIS — H353213 Exudative age-related macular degeneration, right eye, with inactive scar: Secondary | ICD-10-CM | POA: Diagnosis not present

## 2020-03-01 DIAGNOSIS — I428 Other cardiomyopathies: Secondary | ICD-10-CM | POA: Diagnosis not present

## 2020-03-01 DIAGNOSIS — D2239 Melanocytic nevi of other parts of face: Secondary | ICD-10-CM | POA: Diagnosis not present

## 2020-03-01 DIAGNOSIS — Z95828 Presence of other vascular implants and grafts: Secondary | ICD-10-CM | POA: Diagnosis not present

## 2020-03-01 DIAGNOSIS — I679 Cerebrovascular disease, unspecified: Secondary | ICD-10-CM | POA: Diagnosis not present

## 2020-03-01 DIAGNOSIS — M19019 Primary osteoarthritis, unspecified shoulder: Secondary | ICD-10-CM | POA: Diagnosis not present

## 2020-03-01 DIAGNOSIS — Q828 Other specified congenital malformations of skin: Secondary | ICD-10-CM | POA: Diagnosis not present

## 2020-03-01 DIAGNOSIS — J309 Allergic rhinitis, unspecified: Secondary | ICD-10-CM | POA: Diagnosis not present

## 2020-03-01 DIAGNOSIS — C44519 Basal cell carcinoma of skin of other part of trunk: Secondary | ICD-10-CM | POA: Diagnosis not present

## 2020-03-01 DIAGNOSIS — D0511 Intraductal carcinoma in situ of right breast: Secondary | ICD-10-CM | POA: Diagnosis not present

## 2020-03-01 DIAGNOSIS — Z7984 Long term (current) use of oral hypoglycemic drugs: Secondary | ICD-10-CM | POA: Diagnosis not present

## 2020-03-01 DIAGNOSIS — G9341 Metabolic encephalopathy: Secondary | ICD-10-CM | POA: Diagnosis not present

## 2020-03-01 DIAGNOSIS — Z5111 Encounter for antineoplastic chemotherapy: Secondary | ICD-10-CM | POA: Diagnosis not present

## 2020-03-01 DIAGNOSIS — Z01818 Encounter for other preprocedural examination: Secondary | ICD-10-CM | POA: Diagnosis not present

## 2020-03-01 DIAGNOSIS — H25013 Cortical age-related cataract, bilateral: Secondary | ICD-10-CM | POA: Diagnosis not present

## 2020-03-01 DIAGNOSIS — R635 Abnormal weight gain: Secondary | ICD-10-CM | POA: Diagnosis not present

## 2020-03-01 DIAGNOSIS — F331 Major depressive disorder, recurrent, moderate: Secondary | ICD-10-CM | POA: Diagnosis not present

## 2020-03-01 DIAGNOSIS — K529 Noninfective gastroenteritis and colitis, unspecified: Secondary | ICD-10-CM | POA: Diagnosis not present

## 2020-03-01 DIAGNOSIS — Z8042 Family history of malignant neoplasm of prostate: Secondary | ICD-10-CM | POA: Diagnosis not present

## 2020-03-01 DIAGNOSIS — R040 Epistaxis: Secondary | ICD-10-CM | POA: Diagnosis not present

## 2020-03-01 DIAGNOSIS — H4323 Crystalline deposits in vitreous body, bilateral: Secondary | ICD-10-CM | POA: Diagnosis not present

## 2020-03-01 DIAGNOSIS — N390 Urinary tract infection, site not specified: Secondary | ICD-10-CM | POA: Diagnosis not present

## 2020-03-01 DIAGNOSIS — N181 Chronic kidney disease, stage 1: Secondary | ICD-10-CM | POA: Diagnosis not present

## 2020-03-01 DIAGNOSIS — H538 Other visual disturbances: Secondary | ICD-10-CM | POA: Diagnosis not present

## 2020-03-01 DIAGNOSIS — I69398 Other sequelae of cerebral infarction: Secondary | ICD-10-CM | POA: Diagnosis not present

## 2020-03-01 DIAGNOSIS — I2584 Coronary atherosclerosis due to calcified coronary lesion: Secondary | ICD-10-CM | POA: Diagnosis not present

## 2020-03-01 DIAGNOSIS — M25519 Pain in unspecified shoulder: Secondary | ICD-10-CM | POA: Diagnosis not present

## 2020-03-01 DIAGNOSIS — J343 Hypertrophy of nasal turbinates: Secondary | ICD-10-CM | POA: Diagnosis not present

## 2020-03-01 DIAGNOSIS — I442 Atrioventricular block, complete: Secondary | ICD-10-CM | POA: Diagnosis not present

## 2020-03-01 DIAGNOSIS — M75112 Incomplete rotator cuff tear or rupture of left shoulder, not specified as traumatic: Secondary | ICD-10-CM | POA: Diagnosis not present

## 2020-03-01 DIAGNOSIS — H0288A Meibomian gland dysfunction right eye, upper and lower eyelids: Secondary | ICD-10-CM | POA: Diagnosis not present

## 2020-03-01 DIAGNOSIS — M722 Plantar fascial fibromatosis: Secondary | ICD-10-CM | POA: Diagnosis not present

## 2020-03-01 DIAGNOSIS — K588 Other irritable bowel syndrome: Secondary | ICD-10-CM | POA: Diagnosis not present

## 2020-03-01 DIAGNOSIS — Z09 Encounter for follow-up examination after completed treatment for conditions other than malignant neoplasm: Secondary | ICD-10-CM | POA: Diagnosis not present

## 2020-03-01 DIAGNOSIS — R569 Unspecified convulsions: Secondary | ICD-10-CM | POA: Diagnosis not present

## 2020-03-01 DIAGNOSIS — E1136 Type 2 diabetes mellitus with diabetic cataract: Secondary | ICD-10-CM | POA: Diagnosis not present

## 2020-03-01 DIAGNOSIS — M19049 Primary osteoarthritis, unspecified hand: Secondary | ICD-10-CM | POA: Diagnosis not present

## 2020-03-01 DIAGNOSIS — L988 Other specified disorders of the skin and subcutaneous tissue: Secondary | ICD-10-CM | POA: Diagnosis not present

## 2020-03-01 DIAGNOSIS — Z86718 Personal history of other venous thrombosis and embolism: Secondary | ICD-10-CM | POA: Diagnosis not present

## 2020-03-01 DIAGNOSIS — M1731 Unilateral post-traumatic osteoarthritis, right knee: Secondary | ICD-10-CM | POA: Diagnosis not present

## 2020-03-01 DIAGNOSIS — M1A0720 Idiopathic chronic gout, left ankle and foot, without tophus (tophi): Secondary | ICD-10-CM | POA: Diagnosis not present

## 2020-03-01 DIAGNOSIS — E782 Mixed hyperlipidemia: Secondary | ICD-10-CM | POA: Diagnosis not present

## 2020-03-01 DIAGNOSIS — M797 Fibromyalgia: Secondary | ICD-10-CM | POA: Diagnosis not present

## 2020-03-01 DIAGNOSIS — M9080 Osteopathy in diseases classified elsewhere, unspecified site: Secondary | ICD-10-CM | POA: Diagnosis not present

## 2020-03-01 DIAGNOSIS — G44209 Tension-type headache, unspecified, not intractable: Secondary | ICD-10-CM | POA: Diagnosis not present

## 2020-03-01 DIAGNOSIS — S299XXA Unspecified injury of thorax, initial encounter: Secondary | ICD-10-CM | POA: Diagnosis not present

## 2020-03-01 DIAGNOSIS — F5105 Insomnia due to other mental disorder: Secondary | ICD-10-CM | POA: Diagnosis not present

## 2020-03-01 DIAGNOSIS — H43393 Other vitreous opacities, bilateral: Secondary | ICD-10-CM | POA: Diagnosis not present

## 2020-03-01 DIAGNOSIS — E790 Hyperuricemia without signs of inflammatory arthritis and tophaceous disease: Secondary | ICD-10-CM | POA: Diagnosis not present

## 2020-03-01 DIAGNOSIS — T83721D Exposure of implanted vaginal mesh and other prosthetic materials into vagina, subsequent encounter: Secondary | ICD-10-CM | POA: Diagnosis not present

## 2020-03-01 DIAGNOSIS — N189 Chronic kidney disease, unspecified: Secondary | ICD-10-CM | POA: Diagnosis not present

## 2020-03-01 DIAGNOSIS — E039 Hypothyroidism, unspecified: Secondary | ICD-10-CM | POA: Diagnosis not present

## 2020-03-01 DIAGNOSIS — G459 Transient cerebral ischemic attack, unspecified: Secondary | ICD-10-CM | POA: Diagnosis not present

## 2020-03-01 DIAGNOSIS — D2261 Melanocytic nevi of right upper limb, including shoulder: Secondary | ICD-10-CM | POA: Diagnosis not present

## 2020-03-01 DIAGNOSIS — J101 Influenza due to other identified influenza virus with other respiratory manifestations: Secondary | ICD-10-CM | POA: Diagnosis not present

## 2020-03-01 DIAGNOSIS — N3010 Interstitial cystitis (chronic) without hematuria: Secondary | ICD-10-CM | POA: Diagnosis not present

## 2020-03-01 DIAGNOSIS — R410 Disorientation, unspecified: Secondary | ICD-10-CM | POA: Diagnosis not present

## 2020-03-01 DIAGNOSIS — K640 First degree hemorrhoids: Secondary | ICD-10-CM | POA: Diagnosis not present

## 2020-03-01 DIAGNOSIS — Z96652 Presence of left artificial knee joint: Secondary | ICD-10-CM | POA: Diagnosis not present

## 2020-03-01 DIAGNOSIS — M65331 Trigger finger, right middle finger: Secondary | ICD-10-CM | POA: Diagnosis not present

## 2020-03-01 DIAGNOSIS — N343 Urethral syndrome, unspecified: Secondary | ICD-10-CM | POA: Diagnosis not present

## 2020-03-01 DIAGNOSIS — R928 Other abnormal and inconclusive findings on diagnostic imaging of breast: Secondary | ICD-10-CM | POA: Diagnosis not present

## 2020-03-01 DIAGNOSIS — R591 Generalized enlarged lymph nodes: Secondary | ICD-10-CM | POA: Diagnosis not present

## 2020-03-01 DIAGNOSIS — E063 Autoimmune thyroiditis: Secondary | ICD-10-CM | POA: Diagnosis not present

## 2020-03-01 DIAGNOSIS — I4892 Unspecified atrial flutter: Secondary | ICD-10-CM | POA: Diagnosis not present

## 2020-03-01 DIAGNOSIS — R6889 Other general symptoms and signs: Secondary | ICD-10-CM | POA: Diagnosis not present

## 2020-03-01 DIAGNOSIS — C181 Malignant neoplasm of appendix: Secondary | ICD-10-CM | POA: Diagnosis not present

## 2020-03-01 DIAGNOSIS — C9001 Multiple myeloma in remission: Secondary | ICD-10-CM | POA: Diagnosis not present

## 2020-03-01 DIAGNOSIS — H353134 Nonexudative age-related macular degeneration, bilateral, advanced atrophic with subfoveal involvement: Secondary | ICD-10-CM | POA: Diagnosis not present

## 2020-03-01 DIAGNOSIS — H90A22 Sensorineural hearing loss, unilateral, left ear, with restricted hearing on the contralateral side: Secondary | ICD-10-CM | POA: Diagnosis not present

## 2020-03-01 DIAGNOSIS — I358 Other nonrheumatic aortic valve disorders: Secondary | ICD-10-CM | POA: Diagnosis not present

## 2020-03-01 DIAGNOSIS — M25661 Stiffness of right knee, not elsewhere classified: Secondary | ICD-10-CM | POA: Diagnosis not present

## 2020-03-01 DIAGNOSIS — M1990 Unspecified osteoarthritis, unspecified site: Secondary | ICD-10-CM | POA: Diagnosis not present

## 2020-03-01 DIAGNOSIS — H5213 Myopia, bilateral: Secondary | ICD-10-CM | POA: Diagnosis not present

## 2020-03-01 DIAGNOSIS — Z1272 Encounter for screening for malignant neoplasm of vagina: Secondary | ICD-10-CM | POA: Diagnosis not present

## 2020-03-01 DIAGNOSIS — S68629A Partial traumatic transphalangeal amputation of unspecified finger, initial encounter: Secondary | ICD-10-CM | POA: Diagnosis not present

## 2020-03-01 DIAGNOSIS — M2141 Flat foot [pes planus] (acquired), right foot: Secondary | ICD-10-CM | POA: Diagnosis not present

## 2020-03-01 DIAGNOSIS — M86172 Other acute osteomyelitis, left ankle and foot: Secondary | ICD-10-CM | POA: Diagnosis not present

## 2020-03-01 DIAGNOSIS — I2722 Pulmonary hypertension due to left heart disease: Secondary | ICD-10-CM | POA: Diagnosis not present

## 2020-03-01 DIAGNOSIS — H0100A Unspecified blepharitis right eye, upper and lower eyelids: Secondary | ICD-10-CM | POA: Diagnosis not present

## 2020-03-01 DIAGNOSIS — M47812 Spondylosis without myelopathy or radiculopathy, cervical region: Secondary | ICD-10-CM | POA: Diagnosis not present

## 2020-03-01 DIAGNOSIS — L2489 Irritant contact dermatitis due to other agents: Secondary | ICD-10-CM | POA: Diagnosis not present

## 2020-03-01 DIAGNOSIS — M79661 Pain in right lower leg: Secondary | ICD-10-CM | POA: Diagnosis not present

## 2020-03-01 DIAGNOSIS — S22000A Wedge compression fracture of unspecified thoracic vertebra, initial encounter for closed fracture: Secondary | ICD-10-CM | POA: Diagnosis not present

## 2020-03-01 DIAGNOSIS — M654 Radial styloid tenosynovitis [de Quervain]: Secondary | ICD-10-CM | POA: Diagnosis not present

## 2020-03-01 DIAGNOSIS — M25611 Stiffness of right shoulder, not elsewhere classified: Secondary | ICD-10-CM | POA: Diagnosis not present

## 2020-03-01 DIAGNOSIS — M25461 Effusion, right knee: Secondary | ICD-10-CM | POA: Diagnosis not present

## 2020-03-01 DIAGNOSIS — R1084 Generalized abdominal pain: Secondary | ICD-10-CM | POA: Diagnosis not present

## 2020-03-01 DIAGNOSIS — R030 Elevated blood-pressure reading, without diagnosis of hypertension: Secondary | ICD-10-CM | POA: Diagnosis not present

## 2020-03-01 DIAGNOSIS — G40109 Localization-related (focal) (partial) symptomatic epilepsy and epileptic syndromes with simple partial seizures, not intractable, without status epilepticus: Secondary | ICD-10-CM | POA: Diagnosis not present

## 2020-03-01 DIAGNOSIS — C44319 Basal cell carcinoma of skin of other parts of face: Secondary | ICD-10-CM | POA: Diagnosis not present

## 2020-03-01 DIAGNOSIS — I272 Pulmonary hypertension, unspecified: Secondary | ICD-10-CM | POA: Diagnosis not present

## 2020-03-01 DIAGNOSIS — R930 Abnormal findings on diagnostic imaging of skull and head, not elsewhere classified: Secondary | ICD-10-CM | POA: Diagnosis not present

## 2020-03-01 DIAGNOSIS — R58 Hemorrhage, not elsewhere classified: Secondary | ICD-10-CM | POA: Diagnosis not present

## 2020-03-01 DIAGNOSIS — G4733 Obstructive sleep apnea (adult) (pediatric): Secondary | ICD-10-CM | POA: Diagnosis not present

## 2020-03-01 DIAGNOSIS — M5384 Other specified dorsopathies, thoracic region: Secondary | ICD-10-CM | POA: Diagnosis not present

## 2020-03-01 DIAGNOSIS — Z86018 Personal history of other benign neoplasm: Secondary | ICD-10-CM | POA: Diagnosis not present

## 2020-03-01 DIAGNOSIS — R971 Elevated cancer antigen 125 [CA 125]: Secondary | ICD-10-CM | POA: Diagnosis not present

## 2020-03-01 DIAGNOSIS — M79622 Pain in left upper arm: Secondary | ICD-10-CM | POA: Diagnosis not present

## 2020-03-01 DIAGNOSIS — R102 Pelvic and perineal pain: Secondary | ICD-10-CM | POA: Diagnosis not present

## 2020-03-01 DIAGNOSIS — M0609 Rheumatoid arthritis without rheumatoid factor, multiple sites: Secondary | ICD-10-CM | POA: Diagnosis not present

## 2020-03-01 DIAGNOSIS — M79641 Pain in right hand: Secondary | ICD-10-CM | POA: Diagnosis not present

## 2020-03-01 DIAGNOSIS — Z5112 Encounter for antineoplastic immunotherapy: Secondary | ICD-10-CM | POA: Diagnosis not present

## 2020-03-01 DIAGNOSIS — Z8719 Personal history of other diseases of the digestive system: Secondary | ICD-10-CM | POA: Diagnosis not present

## 2020-03-01 DIAGNOSIS — M7989 Other specified soft tissue disorders: Secondary | ICD-10-CM | POA: Diagnosis not present

## 2020-03-01 DIAGNOSIS — F4010 Social phobia, unspecified: Secondary | ICD-10-CM | POA: Diagnosis not present

## 2020-03-01 DIAGNOSIS — R0602 Shortness of breath: Secondary | ICD-10-CM | POA: Diagnosis not present

## 2020-03-01 DIAGNOSIS — B354 Tinea corporis: Secondary | ICD-10-CM | POA: Diagnosis not present

## 2020-03-01 DIAGNOSIS — S46011D Strain of muscle(s) and tendon(s) of the rotator cuff of right shoulder, subsequent encounter: Secondary | ICD-10-CM | POA: Diagnosis not present

## 2020-03-01 DIAGNOSIS — R196 Halitosis: Secondary | ICD-10-CM | POA: Diagnosis not present

## 2020-03-01 DIAGNOSIS — M23222 Derangement of posterior horn of medial meniscus due to old tear or injury, left knee: Secondary | ICD-10-CM | POA: Diagnosis not present

## 2020-03-01 DIAGNOSIS — M79602 Pain in left arm: Secondary | ICD-10-CM | POA: Diagnosis not present

## 2020-03-01 DIAGNOSIS — S20461A Insect bite (nonvenomous) of right back wall of thorax, initial encounter: Secondary | ICD-10-CM | POA: Diagnosis not present

## 2020-03-01 DIAGNOSIS — D2272 Melanocytic nevi of left lower limb, including hip: Secondary | ICD-10-CM | POA: Diagnosis not present

## 2020-03-01 DIAGNOSIS — L439 Lichen planus, unspecified: Secondary | ICD-10-CM | POA: Diagnosis not present

## 2020-03-01 DIAGNOSIS — M720 Palmar fascial fibromatosis [Dupuytren]: Secondary | ICD-10-CM | POA: Diagnosis not present

## 2020-03-01 DIAGNOSIS — L4050 Arthropathic psoriasis, unspecified: Secondary | ICD-10-CM | POA: Diagnosis not present

## 2020-03-01 DIAGNOSIS — G40119 Localization-related (focal) (partial) symptomatic epilepsy and epileptic syndromes with simple partial seizures, intractable, without status epilepticus: Secondary | ICD-10-CM | POA: Diagnosis not present

## 2020-03-01 DIAGNOSIS — H35351 Cystoid macular degeneration, right eye: Secondary | ICD-10-CM | POA: Diagnosis not present

## 2020-03-01 DIAGNOSIS — H6981 Other specified disorders of Eustachian tube, right ear: Secondary | ICD-10-CM | POA: Diagnosis not present

## 2020-03-01 DIAGNOSIS — M87859 Other osteonecrosis, unspecified femur: Secondary | ICD-10-CM | POA: Diagnosis not present

## 2020-03-01 DIAGNOSIS — J45909 Unspecified asthma, uncomplicated: Secondary | ICD-10-CM | POA: Diagnosis not present

## 2020-03-01 DIAGNOSIS — C44729 Squamous cell carcinoma of skin of left lower limb, including hip: Secondary | ICD-10-CM | POA: Diagnosis not present

## 2020-03-01 DIAGNOSIS — H4312 Vitreous hemorrhage, left eye: Secondary | ICD-10-CM | POA: Diagnosis not present

## 2020-03-01 DIAGNOSIS — D692 Other nonthrombocytopenic purpura: Secondary | ICD-10-CM | POA: Diagnosis not present

## 2020-03-01 DIAGNOSIS — E291 Testicular hypofunction: Secondary | ICD-10-CM | POA: Diagnosis not present

## 2020-03-01 DIAGNOSIS — Z7289 Other problems related to lifestyle: Secondary | ICD-10-CM | POA: Diagnosis not present

## 2020-03-01 DIAGNOSIS — C19 Malignant neoplasm of rectosigmoid junction: Secondary | ICD-10-CM | POA: Diagnosis not present

## 2020-03-01 DIAGNOSIS — K228 Other specified diseases of esophagus: Secondary | ICD-10-CM | POA: Diagnosis not present

## 2020-03-01 DIAGNOSIS — I83013 Varicose veins of right lower extremity with ulcer of ankle: Secondary | ICD-10-CM | POA: Diagnosis not present

## 2020-03-01 DIAGNOSIS — N393 Stress incontinence (female) (male): Secondary | ICD-10-CM | POA: Diagnosis not present

## 2020-03-01 DIAGNOSIS — Z713 Dietary counseling and surveillance: Secondary | ICD-10-CM | POA: Diagnosis not present

## 2020-03-01 DIAGNOSIS — H16223 Keratoconjunctivitis sicca, not specified as Sjogren's, bilateral: Secondary | ICD-10-CM | POA: Diagnosis not present

## 2020-03-01 DIAGNOSIS — H018 Other specified inflammations of eyelid: Secondary | ICD-10-CM | POA: Diagnosis not present

## 2020-03-01 DIAGNOSIS — F330 Major depressive disorder, recurrent, mild: Secondary | ICD-10-CM | POA: Diagnosis not present

## 2020-03-01 DIAGNOSIS — Z930 Tracheostomy status: Secondary | ICD-10-CM | POA: Diagnosis not present

## 2020-03-01 DIAGNOSIS — K5902 Outlet dysfunction constipation: Secondary | ICD-10-CM | POA: Diagnosis not present

## 2020-03-01 DIAGNOSIS — C3431 Malignant neoplasm of lower lobe, right bronchus or lung: Secondary | ICD-10-CM | POA: Diagnosis not present

## 2020-03-01 DIAGNOSIS — R41840 Attention and concentration deficit: Secondary | ICD-10-CM | POA: Diagnosis not present

## 2020-03-01 DIAGNOSIS — R0781 Pleurodynia: Secondary | ICD-10-CM | POA: Diagnosis not present

## 2020-03-01 DIAGNOSIS — M06031 Rheumatoid arthritis without rheumatoid factor, right wrist: Secondary | ICD-10-CM | POA: Diagnosis not present

## 2020-03-01 DIAGNOSIS — M8949 Other hypertrophic osteoarthropathy, multiple sites: Secondary | ICD-10-CM | POA: Diagnosis not present

## 2020-03-01 DIAGNOSIS — Z1159 Encounter for screening for other viral diseases: Secondary | ICD-10-CM | POA: Diagnosis not present

## 2020-03-01 DIAGNOSIS — H4921 Sixth [abducent] nerve palsy, right eye: Secondary | ICD-10-CM | POA: Diagnosis not present

## 2020-03-01 DIAGNOSIS — Z9189 Other specified personal risk factors, not elsewhere classified: Secondary | ICD-10-CM | POA: Diagnosis not present

## 2020-03-01 DIAGNOSIS — S233XXA Sprain of ligaments of thoracic spine, initial encounter: Secondary | ICD-10-CM | POA: Diagnosis not present

## 2020-03-01 DIAGNOSIS — R768 Other specified abnormal immunological findings in serum: Secondary | ICD-10-CM | POA: Diagnosis not present

## 2020-03-01 DIAGNOSIS — C50211 Malignant neoplasm of upper-inner quadrant of right female breast: Secondary | ICD-10-CM | POA: Diagnosis not present

## 2020-03-01 DIAGNOSIS — B349 Viral infection, unspecified: Secondary | ICD-10-CM | POA: Diagnosis not present

## 2020-03-01 DIAGNOSIS — M4147 Neuromuscular scoliosis, lumbosacral region: Secondary | ICD-10-CM | POA: Diagnosis not present

## 2020-03-01 DIAGNOSIS — L97422 Non-pressure chronic ulcer of left heel and midfoot with fat layer exposed: Secondary | ICD-10-CM | POA: Diagnosis not present

## 2020-03-01 DIAGNOSIS — M25571 Pain in right ankle and joints of right foot: Secondary | ICD-10-CM | POA: Diagnosis not present

## 2020-03-01 DIAGNOSIS — Z1152 Encounter for screening for COVID-19: Secondary | ICD-10-CM | POA: Diagnosis not present

## 2020-03-01 DIAGNOSIS — M25452 Effusion, left hip: Secondary | ICD-10-CM | POA: Diagnosis not present

## 2020-03-01 DIAGNOSIS — F0391 Unspecified dementia with behavioral disturbance: Secondary | ICD-10-CM | POA: Diagnosis not present

## 2020-03-01 DIAGNOSIS — Z515 Encounter for palliative care: Secondary | ICD-10-CM | POA: Diagnosis not present

## 2020-03-01 DIAGNOSIS — N3941 Urge incontinence: Secondary | ICD-10-CM | POA: Diagnosis not present

## 2020-03-01 DIAGNOSIS — B1920 Unspecified viral hepatitis C without hepatic coma: Secondary | ICD-10-CM | POA: Diagnosis not present

## 2020-03-01 DIAGNOSIS — M205X9 Other deformities of toe(s) (acquired), unspecified foot: Secondary | ICD-10-CM | POA: Diagnosis not present

## 2020-03-01 DIAGNOSIS — H353211 Exudative age-related macular degeneration, right eye, with active choroidal neovascularization: Secondary | ICD-10-CM | POA: Diagnosis not present

## 2020-03-01 DIAGNOSIS — Z96641 Presence of right artificial hip joint: Secondary | ICD-10-CM | POA: Diagnosis not present

## 2020-03-01 DIAGNOSIS — E041 Nontoxic single thyroid nodule: Secondary | ICD-10-CM | POA: Diagnosis not present

## 2020-03-01 DIAGNOSIS — J984 Other disorders of lung: Secondary | ICD-10-CM | POA: Diagnosis not present

## 2020-03-01 DIAGNOSIS — C44719 Basal cell carcinoma of skin of left lower limb, including hip: Secondary | ICD-10-CM | POA: Diagnosis not present

## 2020-03-01 DIAGNOSIS — R260 Ataxic gait: Secondary | ICD-10-CM | POA: Diagnosis not present

## 2020-03-01 DIAGNOSIS — M25651 Stiffness of right hip, not elsewhere classified: Secondary | ICD-10-CM | POA: Diagnosis not present

## 2020-03-01 DIAGNOSIS — H2512 Age-related nuclear cataract, left eye: Secondary | ICD-10-CM | POA: Diagnosis not present

## 2020-03-01 DIAGNOSIS — M62830 Muscle spasm of back: Secondary | ICD-10-CM | POA: Diagnosis not present

## 2020-03-01 DIAGNOSIS — R922 Inconclusive mammogram: Secondary | ICD-10-CM | POA: Diagnosis not present

## 2020-03-01 DIAGNOSIS — I25810 Atherosclerosis of coronary artery bypass graft(s) without angina pectoris: Secondary | ICD-10-CM | POA: Diagnosis not present

## 2020-03-01 DIAGNOSIS — L98491 Non-pressure chronic ulcer of skin of other sites limited to breakdown of skin: Secondary | ICD-10-CM | POA: Diagnosis not present

## 2020-03-01 DIAGNOSIS — L03116 Cellulitis of left lower limb: Secondary | ICD-10-CM | POA: Diagnosis not present

## 2020-03-01 DIAGNOSIS — H59032 Cystoid macular edema following cataract surgery, left eye: Secondary | ICD-10-CM | POA: Diagnosis not present

## 2020-03-01 DIAGNOSIS — C519 Malignant neoplasm of vulva, unspecified: Secondary | ICD-10-CM | POA: Diagnosis not present

## 2020-03-01 DIAGNOSIS — M170 Bilateral primary osteoarthritis of knee: Secondary | ICD-10-CM | POA: Diagnosis not present

## 2020-03-01 DIAGNOSIS — Z681 Body mass index (BMI) 19 or less, adult: Secondary | ICD-10-CM | POA: Diagnosis not present

## 2020-03-01 DIAGNOSIS — Z961 Presence of intraocular lens: Secondary | ICD-10-CM | POA: Diagnosis not present

## 2020-03-01 DIAGNOSIS — Z8619 Personal history of other infectious and parasitic diseases: Secondary | ICD-10-CM | POA: Diagnosis not present

## 2020-03-01 DIAGNOSIS — Z800 Family history of malignant neoplasm of digestive organs: Secondary | ICD-10-CM | POA: Diagnosis not present

## 2020-03-01 DIAGNOSIS — S42214A Unspecified nondisplaced fracture of surgical neck of right humerus, initial encounter for closed fracture: Secondary | ICD-10-CM | POA: Diagnosis not present

## 2020-03-01 DIAGNOSIS — N401 Enlarged prostate with lower urinary tract symptoms: Secondary | ICD-10-CM | POA: Diagnosis not present

## 2020-03-01 DIAGNOSIS — R1110 Vomiting, unspecified: Secondary | ICD-10-CM | POA: Diagnosis not present

## 2020-03-01 DIAGNOSIS — S01112A Laceration without foreign body of left eyelid and periocular area, initial encounter: Secondary | ICD-10-CM | POA: Diagnosis not present

## 2020-03-01 DIAGNOSIS — M25561 Pain in right knee: Secondary | ICD-10-CM | POA: Diagnosis not present

## 2020-03-01 DIAGNOSIS — G609 Hereditary and idiopathic neuropathy, unspecified: Secondary | ICD-10-CM | POA: Diagnosis not present

## 2020-03-01 DIAGNOSIS — G4762 Sleep related leg cramps: Secondary | ICD-10-CM | POA: Diagnosis not present

## 2020-03-01 DIAGNOSIS — H11153 Pinguecula, bilateral: Secondary | ICD-10-CM | POA: Diagnosis not present

## 2020-03-01 DIAGNOSIS — K2980 Duodenitis without bleeding: Secondary | ICD-10-CM | POA: Diagnosis not present

## 2020-03-01 DIAGNOSIS — Z856 Personal history of leukemia: Secondary | ICD-10-CM | POA: Diagnosis not present

## 2020-03-01 DIAGNOSIS — G40909 Epilepsy, unspecified, not intractable, without status epilepticus: Secondary | ICD-10-CM | POA: Diagnosis not present

## 2020-03-01 DIAGNOSIS — B370 Candidal stomatitis: Secondary | ICD-10-CM | POA: Diagnosis not present

## 2020-03-01 DIAGNOSIS — C250 Malignant neoplasm of head of pancreas: Secondary | ICD-10-CM | POA: Diagnosis not present

## 2020-03-01 DIAGNOSIS — N529 Male erectile dysfunction, unspecified: Secondary | ICD-10-CM | POA: Diagnosis not present

## 2020-03-01 DIAGNOSIS — H402221 Chronic angle-closure glaucoma, left eye, mild stage: Secondary | ICD-10-CM | POA: Diagnosis not present

## 2020-03-01 DIAGNOSIS — E786 Lipoprotein deficiency: Secondary | ICD-10-CM | POA: Diagnosis not present

## 2020-03-01 DIAGNOSIS — R21 Rash and other nonspecific skin eruption: Secondary | ICD-10-CM | POA: Diagnosis not present

## 2020-03-01 DIAGNOSIS — E785 Hyperlipidemia, unspecified: Secondary | ICD-10-CM | POA: Diagnosis not present

## 2020-03-01 DIAGNOSIS — R451 Restlessness and agitation: Secondary | ICD-10-CM | POA: Diagnosis not present

## 2020-03-01 DIAGNOSIS — S6990XA Unspecified injury of unspecified wrist, hand and finger(s), initial encounter: Secondary | ICD-10-CM | POA: Diagnosis not present

## 2020-03-01 DIAGNOSIS — M4722 Other spondylosis with radiculopathy, cervical region: Secondary | ICD-10-CM | POA: Diagnosis not present

## 2020-03-01 DIAGNOSIS — S0081XA Abrasion of other part of head, initial encounter: Secondary | ICD-10-CM | POA: Diagnosis not present

## 2020-03-01 DIAGNOSIS — M06032 Rheumatoid arthritis without rheumatoid factor, left wrist: Secondary | ICD-10-CM | POA: Diagnosis not present

## 2020-03-01 DIAGNOSIS — E113293 Type 2 diabetes mellitus with mild nonproliferative diabetic retinopathy without macular edema, bilateral: Secondary | ICD-10-CM | POA: Diagnosis not present

## 2020-03-01 DIAGNOSIS — G2401 Drug induced subacute dyskinesia: Secondary | ICD-10-CM | POA: Diagnosis not present

## 2020-03-01 DIAGNOSIS — M25871 Other specified joint disorders, right ankle and foot: Secondary | ICD-10-CM | POA: Diagnosis not present

## 2020-03-01 DIAGNOSIS — Z6832 Body mass index (BMI) 32.0-32.9, adult: Secondary | ICD-10-CM | POA: Diagnosis not present

## 2020-03-01 DIAGNOSIS — F17200 Nicotine dependence, unspecified, uncomplicated: Secondary | ICD-10-CM | POA: Diagnosis not present

## 2020-03-01 DIAGNOSIS — B9562 Methicillin resistant Staphylococcus aureus infection as the cause of diseases classified elsewhere: Secondary | ICD-10-CM | POA: Diagnosis not present

## 2020-03-01 DIAGNOSIS — L03317 Cellulitis of buttock: Secondary | ICD-10-CM | POA: Diagnosis not present

## 2020-03-01 DIAGNOSIS — M531 Cervicobrachial syndrome: Secondary | ICD-10-CM | POA: Diagnosis not present

## 2020-03-01 DIAGNOSIS — I63132 Cerebral infarction due to embolism of left carotid artery: Secondary | ICD-10-CM | POA: Diagnosis not present

## 2020-03-01 DIAGNOSIS — R3912 Poor urinary stream: Secondary | ICD-10-CM | POA: Diagnosis not present

## 2020-03-01 DIAGNOSIS — Z1331 Encounter for screening for depression: Secondary | ICD-10-CM | POA: Diagnosis not present

## 2020-03-01 DIAGNOSIS — H524 Presbyopia: Secondary | ICD-10-CM | POA: Diagnosis not present

## 2020-03-01 DIAGNOSIS — Z8739 Personal history of other diseases of the musculoskeletal system and connective tissue: Secondary | ICD-10-CM | POA: Diagnosis not present

## 2020-03-01 DIAGNOSIS — G8921 Chronic pain due to trauma: Secondary | ICD-10-CM | POA: Diagnosis not present

## 2020-03-01 DIAGNOSIS — F320 Major depressive disorder, single episode, mild: Secondary | ICD-10-CM | POA: Diagnosis not present

## 2020-03-01 DIAGNOSIS — Z86711 Personal history of pulmonary embolism: Secondary | ICD-10-CM | POA: Diagnosis not present

## 2020-03-01 DIAGNOSIS — I252 Old myocardial infarction: Secondary | ICD-10-CM | POA: Diagnosis not present

## 2020-03-01 DIAGNOSIS — S92315D Nondisplaced fracture of first metatarsal bone, left foot, subsequent encounter for fracture with routine healing: Secondary | ICD-10-CM | POA: Diagnosis not present

## 2020-03-01 DIAGNOSIS — R748 Abnormal levels of other serum enzymes: Secondary | ICD-10-CM | POA: Diagnosis not present

## 2020-03-01 DIAGNOSIS — I340 Nonrheumatic mitral (valve) insufficiency: Secondary | ICD-10-CM | POA: Diagnosis not present

## 2020-03-01 DIAGNOSIS — Z96642 Presence of left artificial hip joint: Secondary | ICD-10-CM | POA: Diagnosis not present

## 2020-03-01 DIAGNOSIS — Z87898 Personal history of other specified conditions: Secondary | ICD-10-CM | POA: Diagnosis not present

## 2020-03-01 DIAGNOSIS — I4819 Other persistent atrial fibrillation: Secondary | ICD-10-CM | POA: Diagnosis not present

## 2020-03-01 DIAGNOSIS — N139 Obstructive and reflux uropathy, unspecified: Secondary | ICD-10-CM | POA: Diagnosis not present

## 2020-03-01 DIAGNOSIS — E2740 Unspecified adrenocortical insufficiency: Secondary | ICD-10-CM | POA: Diagnosis not present

## 2020-03-01 DIAGNOSIS — M25612 Stiffness of left shoulder, not elsewhere classified: Secondary | ICD-10-CM | POA: Diagnosis not present

## 2020-03-01 DIAGNOSIS — H353221 Exudative age-related macular degeneration, left eye, with active choroidal neovascularization: Secondary | ICD-10-CM | POA: Diagnosis not present

## 2020-03-01 DIAGNOSIS — M47814 Spondylosis without myelopathy or radiculopathy, thoracic region: Secondary | ICD-10-CM | POA: Diagnosis not present

## 2020-03-01 DIAGNOSIS — M25562 Pain in left knee: Secondary | ICD-10-CM | POA: Diagnosis not present

## 2020-03-01 DIAGNOSIS — G4730 Sleep apnea, unspecified: Secondary | ICD-10-CM | POA: Diagnosis not present

## 2020-03-01 DIAGNOSIS — C220 Liver cell carcinoma: Secondary | ICD-10-CM | POA: Diagnosis not present

## 2020-03-01 DIAGNOSIS — B182 Chronic viral hepatitis C: Secondary | ICD-10-CM | POA: Diagnosis not present

## 2020-03-01 DIAGNOSIS — C50912 Malignant neoplasm of unspecified site of left female breast: Secondary | ICD-10-CM | POA: Diagnosis not present

## 2020-03-01 DIAGNOSIS — L989 Disorder of the skin and subcutaneous tissue, unspecified: Secondary | ICD-10-CM | POA: Diagnosis not present

## 2020-03-01 DIAGNOSIS — H4922 Sixth [abducent] nerve palsy, left eye: Secondary | ICD-10-CM | POA: Diagnosis not present

## 2020-03-01 DIAGNOSIS — M79674 Pain in right toe(s): Secondary | ICD-10-CM | POA: Diagnosis not present

## 2020-03-01 DIAGNOSIS — M2011 Hallux valgus (acquired), right foot: Secondary | ICD-10-CM | POA: Diagnosis not present

## 2020-03-01 DIAGNOSIS — G9340 Encephalopathy, unspecified: Secondary | ICD-10-CM | POA: Diagnosis not present

## 2020-03-01 DIAGNOSIS — L814 Other melanin hyperpigmentation: Secondary | ICD-10-CM | POA: Diagnosis not present

## 2020-03-01 DIAGNOSIS — R011 Cardiac murmur, unspecified: Secondary | ICD-10-CM | POA: Diagnosis not present

## 2020-03-01 DIAGNOSIS — D1800 Hemangioma unspecified site: Secondary | ICD-10-CM | POA: Diagnosis not present

## 2020-03-01 DIAGNOSIS — Z0000 Encounter for general adult medical examination without abnormal findings: Secondary | ICD-10-CM | POA: Diagnosis not present

## 2020-03-01 DIAGNOSIS — J84112 Idiopathic pulmonary fibrosis: Secondary | ICD-10-CM | POA: Diagnosis not present

## 2020-03-01 DIAGNOSIS — N3000 Acute cystitis without hematuria: Secondary | ICD-10-CM | POA: Diagnosis not present

## 2020-03-01 DIAGNOSIS — E0590 Thyrotoxicosis, unspecified without thyrotoxic crisis or storm: Secondary | ICD-10-CM | POA: Diagnosis not present

## 2020-03-01 DIAGNOSIS — M9903 Segmental and somatic dysfunction of lumbar region: Secondary | ICD-10-CM | POA: Diagnosis not present

## 2020-03-01 DIAGNOSIS — D3132 Benign neoplasm of left choroid: Secondary | ICD-10-CM | POA: Diagnosis not present

## 2020-03-01 DIAGNOSIS — Z7902 Long term (current) use of antithrombotics/antiplatelets: Secondary | ICD-10-CM | POA: Diagnosis not present

## 2020-03-01 DIAGNOSIS — I779 Disorder of arteries and arterioles, unspecified: Secondary | ICD-10-CM | POA: Diagnosis not present

## 2020-03-01 DIAGNOSIS — S46912D Strain of unspecified muscle, fascia and tendon at shoulder and upper arm level, left arm, subsequent encounter: Secondary | ICD-10-CM | POA: Diagnosis not present

## 2020-03-01 DIAGNOSIS — L720 Epidermal cyst: Secondary | ICD-10-CM | POA: Diagnosis not present

## 2020-03-01 DIAGNOSIS — Z01812 Encounter for preprocedural laboratory examination: Secondary | ICD-10-CM | POA: Diagnosis not present

## 2020-03-01 DIAGNOSIS — L400 Psoriasis vulgaris: Secondary | ICD-10-CM | POA: Diagnosis not present

## 2020-03-01 DIAGNOSIS — E669 Obesity, unspecified: Secondary | ICD-10-CM | POA: Diagnosis not present

## 2020-03-01 DIAGNOSIS — M65321 Trigger finger, right index finger: Secondary | ICD-10-CM | POA: Diagnosis not present

## 2020-03-01 DIAGNOSIS — H10412 Chronic giant papillary conjunctivitis, left eye: Secondary | ICD-10-CM | POA: Diagnosis not present

## 2020-03-01 DIAGNOSIS — Z170 Estrogen receptor positive status [ER+]: Secondary | ICD-10-CM | POA: Diagnosis not present

## 2020-03-01 DIAGNOSIS — Z833 Family history of diabetes mellitus: Secondary | ICD-10-CM | POA: Diagnosis not present

## 2020-03-01 DIAGNOSIS — Z6823 Body mass index (BMI) 23.0-23.9, adult: Secondary | ICD-10-CM | POA: Diagnosis not present

## 2020-03-01 DIAGNOSIS — S52501A Unspecified fracture of the lower end of right radius, initial encounter for closed fracture: Secondary | ICD-10-CM | POA: Diagnosis not present

## 2020-03-01 DIAGNOSIS — N400 Enlarged prostate without lower urinary tract symptoms: Secondary | ICD-10-CM | POA: Diagnosis not present

## 2020-03-01 DIAGNOSIS — M05742 Rheumatoid arthritis with rheumatoid factor of left hand without organ or systems involvement: Secondary | ICD-10-CM | POA: Diagnosis not present

## 2020-03-01 DIAGNOSIS — Z6839 Body mass index (BMI) 39.0-39.9, adult: Secondary | ICD-10-CM | POA: Diagnosis not present

## 2020-03-01 DIAGNOSIS — L729 Follicular cyst of the skin and subcutaneous tissue, unspecified: Secondary | ICD-10-CM | POA: Diagnosis not present

## 2020-03-01 DIAGNOSIS — H2511 Age-related nuclear cataract, right eye: Secondary | ICD-10-CM | POA: Diagnosis not present

## 2020-03-01 DIAGNOSIS — R1030 Lower abdominal pain, unspecified: Secondary | ICD-10-CM | POA: Diagnosis not present

## 2020-03-01 DIAGNOSIS — R82998 Other abnormal findings in urine: Secondary | ICD-10-CM | POA: Diagnosis not present

## 2020-03-01 DIAGNOSIS — M150 Primary generalized (osteo)arthritis: Secondary | ICD-10-CM | POA: Diagnosis not present

## 2020-03-01 DIAGNOSIS — S46212A Strain of muscle, fascia and tendon of other parts of biceps, left arm, initial encounter: Secondary | ICD-10-CM | POA: Diagnosis not present

## 2020-03-01 DIAGNOSIS — J9611 Chronic respiratory failure with hypoxia: Secondary | ICD-10-CM | POA: Diagnosis not present

## 2020-03-01 DIAGNOSIS — S8265XD Nondisplaced fracture of lateral malleolus of left fibula, subsequent encounter for closed fracture with routine healing: Secondary | ICD-10-CM | POA: Diagnosis not present

## 2020-03-01 DIAGNOSIS — J1289 Other viral pneumonia: Secondary | ICD-10-CM | POA: Diagnosis not present

## 2020-03-01 DIAGNOSIS — M65312 Trigger thumb, left thumb: Secondary | ICD-10-CM | POA: Diagnosis not present

## 2020-03-01 DIAGNOSIS — R197 Diarrhea, unspecified: Secondary | ICD-10-CM | POA: Diagnosis not present

## 2020-03-01 DIAGNOSIS — Z466 Encounter for fitting and adjustment of urinary device: Secondary | ICD-10-CM | POA: Diagnosis not present

## 2020-03-01 DIAGNOSIS — S32010D Wedge compression fracture of first lumbar vertebra, subsequent encounter for fracture with routine healing: Secondary | ICD-10-CM | POA: Diagnosis not present

## 2020-03-01 DIAGNOSIS — L821 Other seborrheic keratosis: Secondary | ICD-10-CM | POA: Diagnosis not present

## 2020-03-01 DIAGNOSIS — R7989 Other specified abnormal findings of blood chemistry: Secondary | ICD-10-CM | POA: Diagnosis not present

## 2020-03-01 DIAGNOSIS — L97822 Non-pressure chronic ulcer of other part of left lower leg with fat layer exposed: Secondary | ICD-10-CM | POA: Diagnosis not present

## 2020-03-01 DIAGNOSIS — M5442 Lumbago with sciatica, left side: Secondary | ICD-10-CM | POA: Diagnosis not present

## 2020-03-01 DIAGNOSIS — I69320 Aphasia following cerebral infarction: Secondary | ICD-10-CM | POA: Diagnosis not present

## 2020-03-01 DIAGNOSIS — F419 Anxiety disorder, unspecified: Secondary | ICD-10-CM | POA: Diagnosis not present

## 2020-03-01 DIAGNOSIS — Z6826 Body mass index (BMI) 26.0-26.9, adult: Secondary | ICD-10-CM | POA: Diagnosis not present

## 2020-03-01 DIAGNOSIS — E118 Type 2 diabetes mellitus with unspecified complications: Secondary | ICD-10-CM | POA: Diagnosis not present

## 2020-03-01 DIAGNOSIS — I69351 Hemiplegia and hemiparesis following cerebral infarction affecting right dominant side: Secondary | ICD-10-CM | POA: Diagnosis not present

## 2020-03-01 DIAGNOSIS — H3554 Dystrophies primarily involving the retinal pigment epithelium: Secondary | ICD-10-CM | POA: Diagnosis not present

## 2020-03-01 DIAGNOSIS — B0229 Other postherpetic nervous system involvement: Secondary | ICD-10-CM | POA: Diagnosis not present

## 2020-03-01 DIAGNOSIS — E43 Unspecified severe protein-calorie malnutrition: Secondary | ICD-10-CM | POA: Diagnosis not present

## 2020-03-01 DIAGNOSIS — C44329 Squamous cell carcinoma of skin of other parts of face: Secondary | ICD-10-CM | POA: Diagnosis not present

## 2020-03-01 DIAGNOSIS — Z85818 Personal history of malignant neoplasm of other sites of lip, oral cavity, and pharynx: Secondary | ICD-10-CM | POA: Diagnosis not present

## 2020-03-01 DIAGNOSIS — K589 Irritable bowel syndrome without diarrhea: Secondary | ICD-10-CM | POA: Diagnosis not present

## 2020-03-01 DIAGNOSIS — C50812 Malignant neoplasm of overlapping sites of left female breast: Secondary | ICD-10-CM | POA: Diagnosis not present

## 2020-03-01 DIAGNOSIS — R9389 Abnormal findings on diagnostic imaging of other specified body structures: Secondary | ICD-10-CM | POA: Diagnosis not present

## 2020-03-01 DIAGNOSIS — L03032 Cellulitis of left toe: Secondary | ICD-10-CM | POA: Diagnosis not present

## 2020-03-01 DIAGNOSIS — M79606 Pain in leg, unspecified: Secondary | ICD-10-CM | POA: Diagnosis not present

## 2020-03-01 DIAGNOSIS — Z8679 Personal history of other diseases of the circulatory system: Secondary | ICD-10-CM | POA: Diagnosis not present

## 2020-03-01 DIAGNOSIS — H0102B Squamous blepharitis left eye, upper and lower eyelids: Secondary | ICD-10-CM | POA: Diagnosis not present

## 2020-03-01 DIAGNOSIS — I7300 Raynaud's syndrome without gangrene: Secondary | ICD-10-CM | POA: Diagnosis not present

## 2020-03-01 DIAGNOSIS — M069 Rheumatoid arthritis, unspecified: Secondary | ICD-10-CM | POA: Diagnosis not present

## 2020-03-01 DIAGNOSIS — N3946 Mixed incontinence: Secondary | ICD-10-CM | POA: Diagnosis not present

## 2020-03-01 DIAGNOSIS — H9190 Unspecified hearing loss, unspecified ear: Secondary | ICD-10-CM | POA: Diagnosis not present

## 2020-03-01 DIAGNOSIS — L812 Freckles: Secondary | ICD-10-CM | POA: Diagnosis not present

## 2020-03-01 DIAGNOSIS — L290 Pruritus ani: Secondary | ICD-10-CM | POA: Diagnosis not present

## 2020-03-01 DIAGNOSIS — R32 Unspecified urinary incontinence: Secondary | ICD-10-CM | POA: Diagnosis not present

## 2020-03-01 DIAGNOSIS — C50911 Malignant neoplasm of unspecified site of right female breast: Secondary | ICD-10-CM | POA: Diagnosis not present

## 2020-03-01 DIAGNOSIS — M5030 Other cervical disc degeneration, unspecified cervical region: Secondary | ICD-10-CM | POA: Diagnosis not present

## 2020-03-01 DIAGNOSIS — Z1389 Encounter for screening for other disorder: Secondary | ICD-10-CM | POA: Diagnosis not present

## 2020-03-01 DIAGNOSIS — R1012 Left upper quadrant pain: Secondary | ICD-10-CM | POA: Diagnosis not present

## 2020-03-01 DIAGNOSIS — J45901 Unspecified asthma with (acute) exacerbation: Secondary | ICD-10-CM | POA: Diagnosis not present

## 2020-03-01 DIAGNOSIS — M47818 Spondylosis without myelopathy or radiculopathy, sacral and sacrococcygeal region: Secondary | ICD-10-CM | POA: Diagnosis not present

## 2020-03-01 DIAGNOSIS — M12812 Other specific arthropathies, not elsewhere classified, left shoulder: Secondary | ICD-10-CM | POA: Diagnosis not present

## 2020-03-01 DIAGNOSIS — M4696 Unspecified inflammatory spondylopathy, lumbar region: Secondary | ICD-10-CM | POA: Diagnosis not present

## 2020-03-01 DIAGNOSIS — Z9889 Other specified postprocedural states: Secondary | ICD-10-CM | POA: Diagnosis not present

## 2020-03-01 DIAGNOSIS — F0151 Vascular dementia with behavioral disturbance: Secondary | ICD-10-CM | POA: Diagnosis not present

## 2020-03-01 DIAGNOSIS — N62 Hypertrophy of breast: Secondary | ICD-10-CM | POA: Diagnosis not present

## 2020-03-01 DIAGNOSIS — K581 Irritable bowel syndrome with constipation: Secondary | ICD-10-CM | POA: Diagnosis not present

## 2020-03-01 DIAGNOSIS — I2721 Secondary pulmonary arterial hypertension: Secondary | ICD-10-CM | POA: Diagnosis not present

## 2020-03-01 DIAGNOSIS — Z981 Arthrodesis status: Secondary | ICD-10-CM | POA: Diagnosis not present

## 2020-03-01 DIAGNOSIS — L718 Other rosacea: Secondary | ICD-10-CM | POA: Diagnosis not present

## 2020-03-01 DIAGNOSIS — M62512 Muscle wasting and atrophy, not elsewhere classified, left shoulder: Secondary | ICD-10-CM | POA: Diagnosis not present

## 2020-03-01 DIAGNOSIS — E1139 Type 2 diabetes mellitus with other diabetic ophthalmic complication: Secondary | ICD-10-CM | POA: Diagnosis not present

## 2020-03-01 DIAGNOSIS — N5201 Erectile dysfunction due to arterial insufficiency: Secondary | ICD-10-CM | POA: Diagnosis not present

## 2020-03-01 DIAGNOSIS — N411 Chronic prostatitis: Secondary | ICD-10-CM | POA: Diagnosis not present

## 2020-03-01 DIAGNOSIS — R739 Hyperglycemia, unspecified: Secondary | ICD-10-CM | POA: Diagnosis not present

## 2020-03-01 DIAGNOSIS — N402 Nodular prostate without lower urinary tract symptoms: Secondary | ICD-10-CM | POA: Diagnosis not present

## 2020-03-01 DIAGNOSIS — K838 Other specified diseases of biliary tract: Secondary | ICD-10-CM | POA: Diagnosis not present

## 2020-03-01 DIAGNOSIS — M2142 Flat foot [pes planus] (acquired), left foot: Secondary | ICD-10-CM | POA: Diagnosis not present

## 2020-03-01 DIAGNOSIS — J90 Pleural effusion, not elsewhere classified: Secondary | ICD-10-CM | POA: Diagnosis not present

## 2020-03-01 DIAGNOSIS — Z1339 Encounter for screening examination for other mental health and behavioral disorders: Secondary | ICD-10-CM | POA: Diagnosis not present

## 2020-03-01 DIAGNOSIS — K7689 Other specified diseases of liver: Secondary | ICD-10-CM | POA: Diagnosis not present

## 2020-03-01 DIAGNOSIS — S338XXA Sprain of other parts of lumbar spine and pelvis, initial encounter: Secondary | ICD-10-CM | POA: Diagnosis not present

## 2020-03-01 DIAGNOSIS — G4484 Primary exertional headache: Secondary | ICD-10-CM | POA: Diagnosis not present

## 2020-03-01 DIAGNOSIS — H353231 Exudative age-related macular degeneration, bilateral, with active choroidal neovascularization: Secondary | ICD-10-CM | POA: Diagnosis not present

## 2020-03-01 DIAGNOSIS — R1310 Dysphagia, unspecified: Secondary | ICD-10-CM | POA: Diagnosis not present

## 2020-03-02 DIAGNOSIS — B351 Tinea unguium: Secondary | ICD-10-CM | POA: Diagnosis not present

## 2020-03-02 DIAGNOSIS — K621 Rectal polyp: Secondary | ICD-10-CM | POA: Diagnosis not present

## 2020-03-02 DIAGNOSIS — Q667 Congenital pes cavus, unspecified foot: Secondary | ICD-10-CM | POA: Diagnosis not present

## 2020-03-02 DIAGNOSIS — M130 Polyarthritis, unspecified: Secondary | ICD-10-CM | POA: Diagnosis not present

## 2020-03-02 DIAGNOSIS — R1010 Upper abdominal pain, unspecified: Secondary | ICD-10-CM | POA: Diagnosis not present

## 2020-03-02 DIAGNOSIS — H40013 Open angle with borderline findings, low risk, bilateral: Secondary | ICD-10-CM | POA: Diagnosis not present

## 2020-03-02 DIAGNOSIS — R109 Unspecified abdominal pain: Secondary | ICD-10-CM | POA: Diagnosis not present

## 2020-03-02 DIAGNOSIS — C37 Malignant neoplasm of thymus: Secondary | ICD-10-CM | POA: Diagnosis not present

## 2020-03-02 DIAGNOSIS — K6389 Other specified diseases of intestine: Secondary | ICD-10-CM | POA: Diagnosis not present

## 2020-03-02 DIAGNOSIS — H5203 Hypermetropia, bilateral: Secondary | ICD-10-CM | POA: Diagnosis not present

## 2020-03-02 DIAGNOSIS — Z6832 Body mass index (BMI) 32.0-32.9, adult: Secondary | ICD-10-CM | POA: Diagnosis not present

## 2020-03-02 DIAGNOSIS — U071 COVID-19: Secondary | ICD-10-CM | POA: Diagnosis not present

## 2020-03-02 DIAGNOSIS — Z791 Long term (current) use of non-steroidal anti-inflammatories (NSAID): Secondary | ICD-10-CM | POA: Diagnosis not present

## 2020-03-02 DIAGNOSIS — D696 Thrombocytopenia, unspecified: Secondary | ICD-10-CM | POA: Diagnosis not present

## 2020-03-02 DIAGNOSIS — R194 Change in bowel habit: Secondary | ICD-10-CM | POA: Diagnosis not present

## 2020-03-02 DIAGNOSIS — D332 Benign neoplasm of brain, unspecified: Secondary | ICD-10-CM | POA: Diagnosis not present

## 2020-03-02 DIAGNOSIS — N182 Chronic kidney disease, stage 2 (mild): Secondary | ICD-10-CM | POA: Diagnosis not present

## 2020-03-02 DIAGNOSIS — Z89431 Acquired absence of right foot: Secondary | ICD-10-CM | POA: Diagnosis not present

## 2020-03-02 DIAGNOSIS — M1612 Unilateral primary osteoarthritis, left hip: Secondary | ICD-10-CM | POA: Diagnosis not present

## 2020-03-02 DIAGNOSIS — K5790 Diverticulosis of intestine, part unspecified, without perforation or abscess without bleeding: Secondary | ICD-10-CM | POA: Diagnosis not present

## 2020-03-02 DIAGNOSIS — R197 Diarrhea, unspecified: Secondary | ICD-10-CM | POA: Diagnosis not present

## 2020-03-02 DIAGNOSIS — H40053 Ocular hypertension, bilateral: Secondary | ICD-10-CM | POA: Diagnosis not present

## 2020-03-02 DIAGNOSIS — J9611 Chronic respiratory failure with hypoxia: Secondary | ICD-10-CM | POA: Diagnosis not present

## 2020-03-02 DIAGNOSIS — Z955 Presence of coronary angioplasty implant and graft: Secondary | ICD-10-CM | POA: Diagnosis not present

## 2020-03-02 DIAGNOSIS — M67864 Other specified disorders of tendon, left knee: Secondary | ICD-10-CM | POA: Diagnosis not present

## 2020-03-02 DIAGNOSIS — F331 Major depressive disorder, recurrent, moderate: Secondary | ICD-10-CM | POA: Diagnosis not present

## 2020-03-02 DIAGNOSIS — J9612 Chronic respiratory failure with hypercapnia: Secondary | ICD-10-CM | POA: Diagnosis not present

## 2020-03-02 DIAGNOSIS — E1149 Type 2 diabetes mellitus with other diabetic neurological complication: Secondary | ICD-10-CM | POA: Diagnosis not present

## 2020-03-02 DIAGNOSIS — E113291 Type 2 diabetes mellitus with mild nonproliferative diabetic retinopathy without macular edema, right eye: Secondary | ICD-10-CM | POA: Diagnosis not present

## 2020-03-02 DIAGNOSIS — J309 Allergic rhinitis, unspecified: Secondary | ICD-10-CM | POA: Diagnosis not present

## 2020-03-02 DIAGNOSIS — N5201 Erectile dysfunction due to arterial insufficiency: Secondary | ICD-10-CM | POA: Diagnosis not present

## 2020-03-02 DIAGNOSIS — L97422 Non-pressure chronic ulcer of left heel and midfoot with fat layer exposed: Secondary | ICD-10-CM | POA: Diagnosis not present

## 2020-03-02 DIAGNOSIS — K5730 Diverticulosis of large intestine without perforation or abscess without bleeding: Secondary | ICD-10-CM | POA: Diagnosis not present

## 2020-03-02 DIAGNOSIS — I493 Ventricular premature depolarization: Secondary | ICD-10-CM | POA: Diagnosis not present

## 2020-03-02 DIAGNOSIS — F5104 Psychophysiologic insomnia: Secondary | ICD-10-CM | POA: Diagnosis not present

## 2020-03-02 DIAGNOSIS — D34 Benign neoplasm of thyroid gland: Secondary | ICD-10-CM | POA: Diagnosis not present

## 2020-03-02 DIAGNOSIS — S0532XA Ocular laceration without prolapse or loss of intraocular tissue, left eye, initial encounter: Secondary | ICD-10-CM | POA: Diagnosis not present

## 2020-03-02 DIAGNOSIS — G43901 Migraine, unspecified, not intractable, with status migrainosus: Secondary | ICD-10-CM | POA: Diagnosis not present

## 2020-03-02 DIAGNOSIS — L03011 Cellulitis of right finger: Secondary | ICD-10-CM | POA: Diagnosis not present

## 2020-03-02 DIAGNOSIS — L03312 Cellulitis of back [any part except buttock]: Secondary | ICD-10-CM | POA: Diagnosis not present

## 2020-03-02 DIAGNOSIS — M65312 Trigger thumb, left thumb: Secondary | ICD-10-CM | POA: Diagnosis not present

## 2020-03-02 DIAGNOSIS — E668 Other obesity: Secondary | ICD-10-CM | POA: Diagnosis not present

## 2020-03-02 DIAGNOSIS — F17210 Nicotine dependence, cigarettes, uncomplicated: Secondary | ICD-10-CM | POA: Diagnosis not present

## 2020-03-02 DIAGNOSIS — Z043 Encounter for examination and observation following other accident: Secondary | ICD-10-CM | POA: Diagnosis not present

## 2020-03-02 DIAGNOSIS — D0339 Melanoma in situ of other parts of face: Secondary | ICD-10-CM | POA: Diagnosis not present

## 2020-03-02 DIAGNOSIS — M62521 Muscle wasting and atrophy, not elsewhere classified, right upper arm: Secondary | ICD-10-CM | POA: Diagnosis not present

## 2020-03-02 DIAGNOSIS — C3490 Malignant neoplasm of unspecified part of unspecified bronchus or lung: Secondary | ICD-10-CM | POA: Diagnosis not present

## 2020-03-02 DIAGNOSIS — M25612 Stiffness of left shoulder, not elsewhere classified: Secondary | ICD-10-CM | POA: Diagnosis not present

## 2020-03-02 DIAGNOSIS — J209 Acute bronchitis, unspecified: Secondary | ICD-10-CM | POA: Diagnosis not present

## 2020-03-02 DIAGNOSIS — R1900 Intra-abdominal and pelvic swelling, mass and lump, unspecified site: Secondary | ICD-10-CM | POA: Diagnosis not present

## 2020-03-02 DIAGNOSIS — S0512XA Contusion of eyeball and orbital tissues, left eye, initial encounter: Secondary | ICD-10-CM | POA: Diagnosis not present

## 2020-03-02 DIAGNOSIS — M778 Other enthesopathies, not elsewhere classified: Secondary | ICD-10-CM | POA: Diagnosis not present

## 2020-03-02 DIAGNOSIS — J439 Emphysema, unspecified: Secondary | ICD-10-CM | POA: Diagnosis not present

## 2020-03-02 DIAGNOSIS — Z8546 Personal history of malignant neoplasm of prostate: Secondary | ICD-10-CM | POA: Diagnosis not present

## 2020-03-02 DIAGNOSIS — Z885 Allergy status to narcotic agent status: Secondary | ICD-10-CM | POA: Diagnosis not present

## 2020-03-02 DIAGNOSIS — Z13220 Encounter for screening for lipoid disorders: Secondary | ICD-10-CM | POA: Diagnosis not present

## 2020-03-02 DIAGNOSIS — L98421 Non-pressure chronic ulcer of back limited to breakdown of skin: Secondary | ICD-10-CM | POA: Diagnosis not present

## 2020-03-02 DIAGNOSIS — N289 Disorder of kidney and ureter, unspecified: Secondary | ICD-10-CM | POA: Diagnosis not present

## 2020-03-02 DIAGNOSIS — B373 Candidiasis of vulva and vagina: Secondary | ICD-10-CM | POA: Diagnosis not present

## 2020-03-02 DIAGNOSIS — Z953 Presence of xenogenic heart valve: Secondary | ICD-10-CM | POA: Diagnosis not present

## 2020-03-02 DIAGNOSIS — N1832 Chronic kidney disease, stage 3b: Secondary | ICD-10-CM | POA: Diagnosis not present

## 2020-03-02 DIAGNOSIS — R0982 Postnasal drip: Secondary | ICD-10-CM | POA: Diagnosis not present

## 2020-03-02 DIAGNOSIS — Z4689 Encounter for fitting and adjustment of other specified devices: Secondary | ICD-10-CM | POA: Diagnosis not present

## 2020-03-02 DIAGNOSIS — H353211 Exudative age-related macular degeneration, right eye, with active choroidal neovascularization: Secondary | ICD-10-CM | POA: Diagnosis not present

## 2020-03-02 DIAGNOSIS — F3340 Major depressive disorder, recurrent, in remission, unspecified: Secondary | ICD-10-CM | POA: Diagnosis not present

## 2020-03-02 DIAGNOSIS — N390 Urinary tract infection, site not specified: Secondary | ICD-10-CM | POA: Diagnosis not present

## 2020-03-02 DIAGNOSIS — G35 Multiple sclerosis: Secondary | ICD-10-CM | POA: Diagnosis not present

## 2020-03-02 DIAGNOSIS — M79642 Pain in left hand: Secondary | ICD-10-CM | POA: Diagnosis not present

## 2020-03-02 DIAGNOSIS — L97501 Non-pressure chronic ulcer of other part of unspecified foot limited to breakdown of skin: Secondary | ICD-10-CM | POA: Diagnosis not present

## 2020-03-02 DIAGNOSIS — K521 Toxic gastroenteritis and colitis: Secondary | ICD-10-CM | POA: Diagnosis not present

## 2020-03-02 DIAGNOSIS — H8113 Benign paroxysmal vertigo, bilateral: Secondary | ICD-10-CM | POA: Diagnosis not present

## 2020-03-02 DIAGNOSIS — H353121 Nonexudative age-related macular degeneration, left eye, early dry stage: Secondary | ICD-10-CM | POA: Diagnosis not present

## 2020-03-02 DIAGNOSIS — I82412 Acute embolism and thrombosis of left femoral vein: Secondary | ICD-10-CM | POA: Diagnosis not present

## 2020-03-02 DIAGNOSIS — H35372 Puckering of macula, left eye: Secondary | ICD-10-CM | POA: Diagnosis not present

## 2020-03-02 DIAGNOSIS — G40309 Generalized idiopathic epilepsy and epileptic syndromes, not intractable, without status epilepticus: Secondary | ICD-10-CM | POA: Diagnosis not present

## 2020-03-02 DIAGNOSIS — E1122 Type 2 diabetes mellitus with diabetic chronic kidney disease: Secondary | ICD-10-CM | POA: Diagnosis not present

## 2020-03-02 DIAGNOSIS — M4180 Other forms of scoliosis, site unspecified: Secondary | ICD-10-CM | POA: Diagnosis not present

## 2020-03-02 DIAGNOSIS — S0230XA Fracture of orbital floor, unspecified side, initial encounter for closed fracture: Secondary | ICD-10-CM | POA: Diagnosis not present

## 2020-03-02 DIAGNOSIS — L309 Dermatitis, unspecified: Secondary | ICD-10-CM | POA: Diagnosis not present

## 2020-03-02 DIAGNOSIS — Z8669 Personal history of other diseases of the nervous system and sense organs: Secondary | ICD-10-CM | POA: Diagnosis not present

## 2020-03-02 DIAGNOSIS — Z89511 Acquired absence of right leg below knee: Secondary | ICD-10-CM | POA: Diagnosis not present

## 2020-03-02 DIAGNOSIS — R29898 Other symptoms and signs involving the musculoskeletal system: Secondary | ICD-10-CM | POA: Diagnosis not present

## 2020-03-02 DIAGNOSIS — R300 Dysuria: Secondary | ICD-10-CM | POA: Diagnosis not present

## 2020-03-02 DIAGNOSIS — D6869 Other thrombophilia: Secondary | ICD-10-CM | POA: Diagnosis not present

## 2020-03-02 DIAGNOSIS — I5020 Unspecified systolic (congestive) heart failure: Secondary | ICD-10-CM | POA: Diagnosis not present

## 2020-03-02 DIAGNOSIS — N950 Postmenopausal bleeding: Secondary | ICD-10-CM | POA: Diagnosis not present

## 2020-03-02 DIAGNOSIS — D519 Vitamin B12 deficiency anemia, unspecified: Secondary | ICD-10-CM | POA: Diagnosis not present

## 2020-03-02 DIAGNOSIS — M19071 Primary osteoarthritis, right ankle and foot: Secondary | ICD-10-CM | POA: Diagnosis not present

## 2020-03-02 DIAGNOSIS — R490 Dysphonia: Secondary | ICD-10-CM | POA: Diagnosis not present

## 2020-03-02 DIAGNOSIS — Z8679 Personal history of other diseases of the circulatory system: Secondary | ICD-10-CM | POA: Diagnosis not present

## 2020-03-02 DIAGNOSIS — H8111 Benign paroxysmal vertigo, right ear: Secondary | ICD-10-CM | POA: Diagnosis not present

## 2020-03-02 DIAGNOSIS — M179 Osteoarthritis of knee, unspecified: Secondary | ICD-10-CM | POA: Diagnosis not present

## 2020-03-02 DIAGNOSIS — M50123 Cervical disc disorder at C6-C7 level with radiculopathy: Secondary | ICD-10-CM | POA: Diagnosis not present

## 2020-03-02 DIAGNOSIS — M722 Plantar fascial fibromatosis: Secondary | ICD-10-CM | POA: Diagnosis not present

## 2020-03-02 DIAGNOSIS — M7732 Calcaneal spur, left foot: Secondary | ICD-10-CM | POA: Diagnosis not present

## 2020-03-02 DIAGNOSIS — Z6824 Body mass index (BMI) 24.0-24.9, adult: Secondary | ICD-10-CM | POA: Diagnosis not present

## 2020-03-02 DIAGNOSIS — M5126 Other intervertebral disc displacement, lumbar region: Secondary | ICD-10-CM | POA: Diagnosis not present

## 2020-03-02 DIAGNOSIS — H4010X0 Unspecified open-angle glaucoma, stage unspecified: Secondary | ICD-10-CM | POA: Diagnosis not present

## 2020-03-02 DIAGNOSIS — R922 Inconclusive mammogram: Secondary | ICD-10-CM | POA: Diagnosis not present

## 2020-03-02 DIAGNOSIS — L723 Sebaceous cyst: Secondary | ICD-10-CM | POA: Diagnosis not present

## 2020-03-02 DIAGNOSIS — J449 Chronic obstructive pulmonary disease, unspecified: Secondary | ICD-10-CM | POA: Diagnosis not present

## 2020-03-02 DIAGNOSIS — F429 Obsessive-compulsive disorder, unspecified: Secondary | ICD-10-CM | POA: Diagnosis not present

## 2020-03-02 DIAGNOSIS — M25569 Pain in unspecified knee: Secondary | ICD-10-CM | POA: Diagnosis not present

## 2020-03-02 DIAGNOSIS — M7502 Adhesive capsulitis of left shoulder: Secondary | ICD-10-CM | POA: Diagnosis not present

## 2020-03-02 DIAGNOSIS — H81399 Other peripheral vertigo, unspecified ear: Secondary | ICD-10-CM | POA: Diagnosis not present

## 2020-03-02 DIAGNOSIS — I495 Sick sinus syndrome: Secondary | ICD-10-CM | POA: Diagnosis not present

## 2020-03-02 DIAGNOSIS — I10 Essential (primary) hypertension: Secondary | ICD-10-CM | POA: Diagnosis not present

## 2020-03-02 DIAGNOSIS — F4323 Adjustment disorder with mixed anxiety and depressed mood: Secondary | ICD-10-CM | POA: Diagnosis not present

## 2020-03-02 DIAGNOSIS — H16223 Keratoconjunctivitis sicca, not specified as Sjogren's, bilateral: Secondary | ICD-10-CM | POA: Diagnosis not present

## 2020-03-02 DIAGNOSIS — J42 Unspecified chronic bronchitis: Secondary | ICD-10-CM | POA: Diagnosis not present

## 2020-03-02 DIAGNOSIS — K2900 Acute gastritis without bleeding: Secondary | ICD-10-CM | POA: Diagnosis not present

## 2020-03-02 DIAGNOSIS — H401132 Primary open-angle glaucoma, bilateral, moderate stage: Secondary | ICD-10-CM | POA: Diagnosis not present

## 2020-03-02 DIAGNOSIS — D2272 Melanocytic nevi of left lower limb, including hip: Secondary | ICD-10-CM | POA: Diagnosis not present

## 2020-03-02 DIAGNOSIS — Z96641 Presence of right artificial hip joint: Secondary | ICD-10-CM | POA: Diagnosis not present

## 2020-03-02 DIAGNOSIS — Z08 Encounter for follow-up examination after completed treatment for malignant neoplasm: Secondary | ICD-10-CM | POA: Diagnosis not present

## 2020-03-02 DIAGNOSIS — K567 Ileus, unspecified: Secondary | ICD-10-CM | POA: Diagnosis not present

## 2020-03-02 DIAGNOSIS — C189 Malignant neoplasm of colon, unspecified: Secondary | ICD-10-CM | POA: Diagnosis not present

## 2020-03-02 DIAGNOSIS — I25709 Atherosclerosis of coronary artery bypass graft(s), unspecified, with unspecified angina pectoris: Secondary | ICD-10-CM | POA: Diagnosis not present

## 2020-03-02 DIAGNOSIS — M8949 Other hypertrophic osteoarthropathy, multiple sites: Secondary | ICD-10-CM | POA: Diagnosis not present

## 2020-03-02 DIAGNOSIS — M5417 Radiculopathy, lumbosacral region: Secondary | ICD-10-CM | POA: Diagnosis not present

## 2020-03-02 DIAGNOSIS — E1169 Type 2 diabetes mellitus with other specified complication: Secondary | ICD-10-CM | POA: Diagnosis not present

## 2020-03-02 DIAGNOSIS — D3501 Benign neoplasm of right adrenal gland: Secondary | ICD-10-CM | POA: Diagnosis not present

## 2020-03-02 DIAGNOSIS — M7661 Achilles tendinitis, right leg: Secondary | ICD-10-CM | POA: Diagnosis not present

## 2020-03-02 DIAGNOSIS — E079 Disorder of thyroid, unspecified: Secondary | ICD-10-CM | POA: Diagnosis not present

## 2020-03-02 DIAGNOSIS — I25118 Atherosclerotic heart disease of native coronary artery with other forms of angina pectoris: Secondary | ICD-10-CM | POA: Diagnosis not present

## 2020-03-02 DIAGNOSIS — M65341 Trigger finger, right ring finger: Secondary | ICD-10-CM | POA: Diagnosis not present

## 2020-03-02 DIAGNOSIS — Z1329 Encounter for screening for other suspected endocrine disorder: Secondary | ICD-10-CM | POA: Diagnosis not present

## 2020-03-02 DIAGNOSIS — Z96612 Presence of left artificial shoulder joint: Secondary | ICD-10-CM | POA: Diagnosis not present

## 2020-03-02 DIAGNOSIS — R87610 Atypical squamous cells of undetermined significance on cytologic smear of cervix (ASC-US): Secondary | ICD-10-CM | POA: Diagnosis not present

## 2020-03-02 DIAGNOSIS — R600 Localized edema: Secondary | ICD-10-CM | POA: Diagnosis not present

## 2020-03-02 DIAGNOSIS — C20 Malignant neoplasm of rectum: Secondary | ICD-10-CM | POA: Diagnosis not present

## 2020-03-02 DIAGNOSIS — C49A3 Gastrointestinal stromal tumor of small intestine: Secondary | ICD-10-CM | POA: Diagnosis not present

## 2020-03-02 DIAGNOSIS — J410 Simple chronic bronchitis: Secondary | ICD-10-CM | POA: Diagnosis not present

## 2020-03-02 DIAGNOSIS — G4709 Other insomnia: Secondary | ICD-10-CM | POA: Diagnosis not present

## 2020-03-02 DIAGNOSIS — E663 Overweight: Secondary | ICD-10-CM | POA: Diagnosis not present

## 2020-03-02 DIAGNOSIS — C50311 Malignant neoplasm of lower-inner quadrant of right female breast: Secondary | ICD-10-CM | POA: Diagnosis not present

## 2020-03-02 DIAGNOSIS — S233XXD Sprain of ligaments of thoracic spine, subsequent encounter: Secondary | ICD-10-CM | POA: Diagnosis not present

## 2020-03-02 DIAGNOSIS — I1310 Hypertensive heart and chronic kidney disease without heart failure, with stage 1 through stage 4 chronic kidney disease, or unspecified chronic kidney disease: Secondary | ICD-10-CM | POA: Diagnosis not present

## 2020-03-02 DIAGNOSIS — R1012 Left upper quadrant pain: Secondary | ICD-10-CM | POA: Diagnosis not present

## 2020-03-02 DIAGNOSIS — G43011 Migraine without aura, intractable, with status migrainosus: Secondary | ICD-10-CM | POA: Diagnosis not present

## 2020-03-02 DIAGNOSIS — R001 Bradycardia, unspecified: Secondary | ICD-10-CM | POA: Diagnosis not present

## 2020-03-02 DIAGNOSIS — Z86711 Personal history of pulmonary embolism: Secondary | ICD-10-CM | POA: Diagnosis not present

## 2020-03-02 DIAGNOSIS — R7309 Other abnormal glucose: Secondary | ICD-10-CM | POA: Diagnosis not present

## 2020-03-02 DIAGNOSIS — Z6841 Body Mass Index (BMI) 40.0 and over, adult: Secondary | ICD-10-CM | POA: Diagnosis not present

## 2020-03-02 DIAGNOSIS — R609 Edema, unspecified: Secondary | ICD-10-CM | POA: Diagnosis not present

## 2020-03-02 DIAGNOSIS — R2241 Localized swelling, mass and lump, right lower limb: Secondary | ICD-10-CM | POA: Diagnosis not present

## 2020-03-02 DIAGNOSIS — N809 Endometriosis, unspecified: Secondary | ICD-10-CM | POA: Diagnosis not present

## 2020-03-02 DIAGNOSIS — J8489 Other specified interstitial pulmonary diseases: Secondary | ICD-10-CM | POA: Diagnosis not present

## 2020-03-02 DIAGNOSIS — H33303 Unspecified retinal break, bilateral: Secondary | ICD-10-CM | POA: Diagnosis not present

## 2020-03-02 DIAGNOSIS — N529 Male erectile dysfunction, unspecified: Secondary | ICD-10-CM | POA: Diagnosis not present

## 2020-03-02 DIAGNOSIS — Z9621 Cochlear implant status: Secondary | ICD-10-CM | POA: Diagnosis not present

## 2020-03-02 DIAGNOSIS — I779 Disorder of arteries and arterioles, unspecified: Secondary | ICD-10-CM | POA: Diagnosis not present

## 2020-03-02 DIAGNOSIS — M792 Neuralgia and neuritis, unspecified: Secondary | ICD-10-CM | POA: Diagnosis not present

## 2020-03-02 DIAGNOSIS — K5900 Constipation, unspecified: Secondary | ICD-10-CM | POA: Diagnosis not present

## 2020-03-02 DIAGNOSIS — D0461 Carcinoma in situ of skin of right upper limb, including shoulder: Secondary | ICD-10-CM | POA: Diagnosis not present

## 2020-03-02 DIAGNOSIS — M5020 Other cervical disc displacement, unspecified cervical region: Secondary | ICD-10-CM | POA: Diagnosis not present

## 2020-03-02 DIAGNOSIS — H6983 Other specified disorders of Eustachian tube, bilateral: Secondary | ICD-10-CM | POA: Diagnosis not present

## 2020-03-02 DIAGNOSIS — Z89611 Acquired absence of right leg above knee: Secondary | ICD-10-CM | POA: Diagnosis not present

## 2020-03-02 DIAGNOSIS — F0781 Postconcussional syndrome: Secondary | ICD-10-CM | POA: Diagnosis not present

## 2020-03-02 DIAGNOSIS — L720 Epidermal cyst: Secondary | ICD-10-CM | POA: Diagnosis not present

## 2020-03-02 DIAGNOSIS — G930 Cerebral cysts: Secondary | ICD-10-CM | POA: Diagnosis not present

## 2020-03-02 DIAGNOSIS — I6522 Occlusion and stenosis of left carotid artery: Secondary | ICD-10-CM | POA: Diagnosis not present

## 2020-03-02 DIAGNOSIS — D219 Benign neoplasm of connective and other soft tissue, unspecified: Secondary | ICD-10-CM | POA: Diagnosis not present

## 2020-03-02 DIAGNOSIS — D7589 Other specified diseases of blood and blood-forming organs: Secondary | ICD-10-CM | POA: Diagnosis not present

## 2020-03-02 DIAGNOSIS — I519 Heart disease, unspecified: Secondary | ICD-10-CM | POA: Diagnosis not present

## 2020-03-02 DIAGNOSIS — R4189 Other symptoms and signs involving cognitive functions and awareness: Secondary | ICD-10-CM | POA: Diagnosis not present

## 2020-03-02 DIAGNOSIS — R220 Localized swelling, mass and lump, head: Secondary | ICD-10-CM | POA: Diagnosis not present

## 2020-03-02 DIAGNOSIS — R519 Headache, unspecified: Secondary | ICD-10-CM | POA: Diagnosis not present

## 2020-03-02 DIAGNOSIS — S52501A Unspecified fracture of the lower end of right radius, initial encounter for closed fracture: Secondary | ICD-10-CM | POA: Diagnosis not present

## 2020-03-02 DIAGNOSIS — M1A9XX0 Chronic gout, unspecified, without tophus (tophi): Secondary | ICD-10-CM | POA: Diagnosis not present

## 2020-03-02 DIAGNOSIS — D124 Benign neoplasm of descending colon: Secondary | ICD-10-CM | POA: Diagnosis not present

## 2020-03-02 DIAGNOSIS — M961 Postlaminectomy syndrome, not elsewhere classified: Secondary | ICD-10-CM | POA: Diagnosis not present

## 2020-03-02 DIAGNOSIS — Z7982 Long term (current) use of aspirin: Secondary | ICD-10-CM | POA: Diagnosis not present

## 2020-03-02 DIAGNOSIS — N6312 Unspecified lump in the right breast, upper inner quadrant: Secondary | ICD-10-CM | POA: Diagnosis not present

## 2020-03-02 DIAGNOSIS — R9389 Abnormal findings on diagnostic imaging of other specified body structures: Secondary | ICD-10-CM | POA: Diagnosis not present

## 2020-03-02 DIAGNOSIS — R269 Unspecified abnormalities of gait and mobility: Secondary | ICD-10-CM | POA: Diagnosis not present

## 2020-03-02 DIAGNOSIS — Z6826 Body mass index (BMI) 26.0-26.9, adult: Secondary | ICD-10-CM | POA: Diagnosis not present

## 2020-03-02 DIAGNOSIS — R945 Abnormal results of liver function studies: Secondary | ICD-10-CM | POA: Diagnosis not present

## 2020-03-02 DIAGNOSIS — K219 Gastro-esophageal reflux disease without esophagitis: Secondary | ICD-10-CM | POA: Diagnosis not present

## 2020-03-02 DIAGNOSIS — N111 Chronic obstructive pyelonephritis: Secondary | ICD-10-CM | POA: Diagnosis not present

## 2020-03-02 DIAGNOSIS — Z2821 Immunization not carried out because of patient refusal: Secondary | ICD-10-CM | POA: Diagnosis not present

## 2020-03-02 DIAGNOSIS — H269 Unspecified cataract: Secondary | ICD-10-CM | POA: Diagnosis not present

## 2020-03-02 DIAGNOSIS — I482 Chronic atrial fibrillation, unspecified: Secondary | ICD-10-CM | POA: Diagnosis not present

## 2020-03-02 DIAGNOSIS — Z4889 Encounter for other specified surgical aftercare: Secondary | ICD-10-CM | POA: Diagnosis not present

## 2020-03-02 DIAGNOSIS — M75112 Incomplete rotator cuff tear or rupture of left shoulder, not specified as traumatic: Secondary | ICD-10-CM | POA: Diagnosis not present

## 2020-03-02 DIAGNOSIS — R21 Rash and other nonspecific skin eruption: Secondary | ICD-10-CM | POA: Diagnosis not present

## 2020-03-02 DIAGNOSIS — E049 Nontoxic goiter, unspecified: Secondary | ICD-10-CM | POA: Diagnosis not present

## 2020-03-02 DIAGNOSIS — R935 Abnormal findings on diagnostic imaging of other abdominal regions, including retroperitoneum: Secondary | ICD-10-CM | POA: Diagnosis not present

## 2020-03-02 DIAGNOSIS — Z1339 Encounter for screening examination for other mental health and behavioral disorders: Secondary | ICD-10-CM | POA: Diagnosis not present

## 2020-03-02 DIAGNOSIS — E890 Postprocedural hypothyroidism: Secondary | ICD-10-CM | POA: Diagnosis not present

## 2020-03-02 DIAGNOSIS — Z7901 Long term (current) use of anticoagulants: Secondary | ICD-10-CM | POA: Diagnosis not present

## 2020-03-02 DIAGNOSIS — R41841 Cognitive communication deficit: Secondary | ICD-10-CM | POA: Diagnosis not present

## 2020-03-02 DIAGNOSIS — Z6829 Body mass index (BMI) 29.0-29.9, adult: Secondary | ICD-10-CM | POA: Diagnosis not present

## 2020-03-02 DIAGNOSIS — Z7409 Other reduced mobility: Secondary | ICD-10-CM | POA: Diagnosis not present

## 2020-03-02 DIAGNOSIS — M531 Cervicobrachial syndrome: Secondary | ICD-10-CM | POA: Diagnosis not present

## 2020-03-02 DIAGNOSIS — I350 Nonrheumatic aortic (valve) stenosis: Secondary | ICD-10-CM | POA: Diagnosis not present

## 2020-03-02 DIAGNOSIS — K921 Melena: Secondary | ICD-10-CM | POA: Diagnosis not present

## 2020-03-02 DIAGNOSIS — J0100 Acute maxillary sinusitis, unspecified: Secondary | ICD-10-CM | POA: Diagnosis not present

## 2020-03-02 DIAGNOSIS — R972 Elevated prostate specific antigen [PSA]: Secondary | ICD-10-CM | POA: Diagnosis not present

## 2020-03-02 DIAGNOSIS — Z794 Long term (current) use of insulin: Secondary | ICD-10-CM | POA: Diagnosis not present

## 2020-03-02 DIAGNOSIS — Z8639 Personal history of other endocrine, nutritional and metabolic disease: Secondary | ICD-10-CM | POA: Diagnosis not present

## 2020-03-02 DIAGNOSIS — Z96643 Presence of artificial hip joint, bilateral: Secondary | ICD-10-CM | POA: Diagnosis not present

## 2020-03-02 DIAGNOSIS — D120 Benign neoplasm of cecum: Secondary | ICD-10-CM | POA: Diagnosis not present

## 2020-03-02 DIAGNOSIS — Q600 Renal agenesis, unilateral: Secondary | ICD-10-CM | POA: Diagnosis not present

## 2020-03-02 DIAGNOSIS — Z712 Person consulting for explanation of examination or test findings: Secondary | ICD-10-CM | POA: Diagnosis not present

## 2020-03-02 DIAGNOSIS — G5793 Unspecified mononeuropathy of bilateral lower limbs: Secondary | ICD-10-CM | POA: Diagnosis not present

## 2020-03-02 DIAGNOSIS — M48061 Spinal stenosis, lumbar region without neurogenic claudication: Secondary | ICD-10-CM | POA: Diagnosis not present

## 2020-03-02 DIAGNOSIS — R413 Other amnesia: Secondary | ICD-10-CM | POA: Diagnosis not present

## 2020-03-02 DIAGNOSIS — M160 Bilateral primary osteoarthritis of hip: Secondary | ICD-10-CM | POA: Diagnosis not present

## 2020-03-02 DIAGNOSIS — L821 Other seborrheic keratosis: Secondary | ICD-10-CM | POA: Diagnosis not present

## 2020-03-02 DIAGNOSIS — R4182 Altered mental status, unspecified: Secondary | ICD-10-CM | POA: Diagnosis not present

## 2020-03-02 DIAGNOSIS — M1991 Primary osteoarthritis, unspecified site: Secondary | ICD-10-CM | POA: Diagnosis not present

## 2020-03-02 DIAGNOSIS — H25811 Combined forms of age-related cataract, right eye: Secondary | ICD-10-CM | POA: Diagnosis not present

## 2020-03-02 DIAGNOSIS — L719 Rosacea, unspecified: Secondary | ICD-10-CM | POA: Diagnosis not present

## 2020-03-02 DIAGNOSIS — M5388 Other specified dorsopathies, sacral and sacrococcygeal region: Secondary | ICD-10-CM | POA: Diagnosis not present

## 2020-03-02 DIAGNOSIS — D2261 Melanocytic nevi of right upper limb, including shoulder: Secondary | ICD-10-CM | POA: Diagnosis not present

## 2020-03-02 DIAGNOSIS — R1110 Vomiting, unspecified: Secondary | ICD-10-CM | POA: Diagnosis not present

## 2020-03-02 DIAGNOSIS — M47892 Other spondylosis, cervical region: Secondary | ICD-10-CM | POA: Diagnosis not present

## 2020-03-02 DIAGNOSIS — Z923 Personal history of irradiation: Secondary | ICD-10-CM | POA: Diagnosis not present

## 2020-03-02 DIAGNOSIS — M47812 Spondylosis without myelopathy or radiculopathy, cervical region: Secondary | ICD-10-CM | POA: Diagnosis not present

## 2020-03-02 DIAGNOSIS — F17200 Nicotine dependence, unspecified, uncomplicated: Secondary | ICD-10-CM | POA: Diagnosis not present

## 2020-03-02 DIAGNOSIS — M79661 Pain in right lower leg: Secondary | ICD-10-CM | POA: Diagnosis not present

## 2020-03-02 DIAGNOSIS — H8103 Meniere's disease, bilateral: Secondary | ICD-10-CM | POA: Diagnosis not present

## 2020-03-02 DIAGNOSIS — I700 Atherosclerosis of aorta: Secondary | ICD-10-CM | POA: Diagnosis not present

## 2020-03-02 DIAGNOSIS — E2740 Unspecified adrenocortical insufficiency: Secondary | ICD-10-CM | POA: Diagnosis not present

## 2020-03-02 DIAGNOSIS — I4510 Unspecified right bundle-branch block: Secondary | ICD-10-CM | POA: Diagnosis not present

## 2020-03-02 DIAGNOSIS — H43812 Vitreous degeneration, left eye: Secondary | ICD-10-CM | POA: Diagnosis not present

## 2020-03-02 DIAGNOSIS — R002 Palpitations: Secondary | ICD-10-CM | POA: Diagnosis not present

## 2020-03-02 DIAGNOSIS — R262 Difficulty in walking, not elsewhere classified: Secondary | ICD-10-CM | POA: Diagnosis not present

## 2020-03-02 DIAGNOSIS — M25561 Pain in right knee: Secondary | ICD-10-CM | POA: Diagnosis not present

## 2020-03-02 DIAGNOSIS — M1990 Unspecified osteoarthritis, unspecified site: Secondary | ICD-10-CM | POA: Diagnosis not present

## 2020-03-02 DIAGNOSIS — R918 Other nonspecific abnormal finding of lung field: Secondary | ICD-10-CM | POA: Diagnosis not present

## 2020-03-02 DIAGNOSIS — M19031 Primary osteoarthritis, right wrist: Secondary | ICD-10-CM | POA: Diagnosis not present

## 2020-03-02 DIAGNOSIS — F3110 Bipolar disorder, current episode manic without psychotic features, unspecified: Secondary | ICD-10-CM | POA: Diagnosis not present

## 2020-03-02 DIAGNOSIS — M8580 Other specified disorders of bone density and structure, unspecified site: Secondary | ICD-10-CM | POA: Diagnosis not present

## 2020-03-02 DIAGNOSIS — Z86007 Personal history of in-situ neoplasm of skin: Secondary | ICD-10-CM | POA: Diagnosis not present

## 2020-03-02 DIAGNOSIS — E440 Moderate protein-calorie malnutrition: Secondary | ICD-10-CM | POA: Diagnosis not present

## 2020-03-02 DIAGNOSIS — L03116 Cellulitis of left lower limb: Secondary | ICD-10-CM | POA: Diagnosis not present

## 2020-03-02 DIAGNOSIS — M79646 Pain in unspecified finger(s): Secondary | ICD-10-CM | POA: Diagnosis not present

## 2020-03-02 DIAGNOSIS — G709 Myoneural disorder, unspecified: Secondary | ICD-10-CM | POA: Diagnosis not present

## 2020-03-02 DIAGNOSIS — Z716 Tobacco abuse counseling: Secondary | ICD-10-CM | POA: Diagnosis not present

## 2020-03-02 DIAGNOSIS — Z6835 Body mass index (BMI) 35.0-35.9, adult: Secondary | ICD-10-CM | POA: Diagnosis not present

## 2020-03-02 DIAGNOSIS — Z5321 Procedure and treatment not carried out due to patient leaving prior to being seen by health care provider: Secondary | ICD-10-CM | POA: Diagnosis not present

## 2020-03-02 DIAGNOSIS — H4311 Vitreous hemorrhage, right eye: Secondary | ICD-10-CM | POA: Diagnosis not present

## 2020-03-02 DIAGNOSIS — H1045 Other chronic allergic conjunctivitis: Secondary | ICD-10-CM | POA: Diagnosis not present

## 2020-03-02 DIAGNOSIS — I6389 Other cerebral infarction: Secondary | ICD-10-CM | POA: Diagnosis not present

## 2020-03-02 DIAGNOSIS — C4442 Squamous cell carcinoma of skin of scalp and neck: Secondary | ICD-10-CM | POA: Diagnosis not present

## 2020-03-02 DIAGNOSIS — L508 Other urticaria: Secondary | ICD-10-CM | POA: Diagnosis not present

## 2020-03-02 DIAGNOSIS — I69354 Hemiplegia and hemiparesis following cerebral infarction affecting left non-dominant side: Secondary | ICD-10-CM | POA: Diagnosis not present

## 2020-03-02 DIAGNOSIS — Z8739 Personal history of other diseases of the musculoskeletal system and connective tissue: Secondary | ICD-10-CM | POA: Diagnosis not present

## 2020-03-02 DIAGNOSIS — H35352 Cystoid macular degeneration, left eye: Secondary | ICD-10-CM | POA: Diagnosis not present

## 2020-03-02 DIAGNOSIS — I5022 Chronic systolic (congestive) heart failure: Secondary | ICD-10-CM | POA: Diagnosis not present

## 2020-03-02 DIAGNOSIS — G603 Idiopathic progressive neuropathy: Secondary | ICD-10-CM | POA: Diagnosis not present

## 2020-03-02 DIAGNOSIS — I6300 Cerebral infarction due to thrombosis of unspecified precerebral artery: Secondary | ICD-10-CM | POA: Diagnosis not present

## 2020-03-02 DIAGNOSIS — R251 Tremor, unspecified: Secondary | ICD-10-CM | POA: Diagnosis not present

## 2020-03-02 DIAGNOSIS — R011 Cardiac murmur, unspecified: Secondary | ICD-10-CM | POA: Diagnosis not present

## 2020-03-02 DIAGNOSIS — D229 Melanocytic nevi, unspecified: Secondary | ICD-10-CM | POA: Diagnosis not present

## 2020-03-02 DIAGNOSIS — J4540 Moderate persistent asthma, uncomplicated: Secondary | ICD-10-CM | POA: Diagnosis not present

## 2020-03-02 DIAGNOSIS — F338 Other recurrent depressive disorders: Secondary | ICD-10-CM | POA: Diagnosis not present

## 2020-03-02 DIAGNOSIS — Z952 Presence of prosthetic heart valve: Secondary | ICD-10-CM | POA: Diagnosis not present

## 2020-03-02 DIAGNOSIS — G5792 Unspecified mononeuropathy of left lower limb: Secondary | ICD-10-CM | POA: Diagnosis not present

## 2020-03-02 DIAGNOSIS — R260 Ataxic gait: Secondary | ICD-10-CM | POA: Diagnosis not present

## 2020-03-02 DIAGNOSIS — S46212A Strain of muscle, fascia and tendon of other parts of biceps, left arm, initial encounter: Secondary | ICD-10-CM | POA: Diagnosis not present

## 2020-03-02 DIAGNOSIS — M25462 Effusion, left knee: Secondary | ICD-10-CM | POA: Diagnosis not present

## 2020-03-02 DIAGNOSIS — L02415 Cutaneous abscess of right lower limb: Secondary | ICD-10-CM | POA: Diagnosis not present

## 2020-03-02 DIAGNOSIS — M25552 Pain in left hip: Secondary | ICD-10-CM | POA: Diagnosis not present

## 2020-03-02 DIAGNOSIS — K5090 Crohn's disease, unspecified, without complications: Secondary | ICD-10-CM | POA: Diagnosis not present

## 2020-03-02 DIAGNOSIS — E7800 Pure hypercholesterolemia, unspecified: Secondary | ICD-10-CM | POA: Diagnosis not present

## 2020-03-02 DIAGNOSIS — R931 Abnormal findings on diagnostic imaging of heart and coronary circulation: Secondary | ICD-10-CM | POA: Diagnosis not present

## 2020-03-02 DIAGNOSIS — M519 Unspecified thoracic, thoracolumbar and lumbosacral intervertebral disc disorder: Secondary | ICD-10-CM | POA: Diagnosis not present

## 2020-03-02 DIAGNOSIS — Z125 Encounter for screening for malignant neoplasm of prostate: Secondary | ICD-10-CM | POA: Diagnosis not present

## 2020-03-02 DIAGNOSIS — Z114 Encounter for screening for human immunodeficiency virus [HIV]: Secondary | ICD-10-CM | POA: Diagnosis not present

## 2020-03-02 DIAGNOSIS — M5432 Sciatica, left side: Secondary | ICD-10-CM | POA: Diagnosis not present

## 2020-03-02 DIAGNOSIS — N410 Acute prostatitis: Secondary | ICD-10-CM | POA: Diagnosis not present

## 2020-03-02 DIAGNOSIS — F3161 Bipolar disorder, current episode mixed, mild: Secondary | ICD-10-CM | POA: Diagnosis not present

## 2020-03-02 DIAGNOSIS — R432 Parageusia: Secondary | ICD-10-CM | POA: Diagnosis not present

## 2020-03-02 DIAGNOSIS — G459 Transient cerebral ischemic attack, unspecified: Secondary | ICD-10-CM | POA: Diagnosis not present

## 2020-03-02 DIAGNOSIS — M79641 Pain in right hand: Secondary | ICD-10-CM | POA: Diagnosis not present

## 2020-03-02 DIAGNOSIS — M48062 Spinal stenosis, lumbar region with neurogenic claudication: Secondary | ICD-10-CM | POA: Diagnosis not present

## 2020-03-02 DIAGNOSIS — M4716 Other spondylosis with myelopathy, lumbar region: Secondary | ICD-10-CM | POA: Diagnosis not present

## 2020-03-02 DIAGNOSIS — M79671 Pain in right foot: Secondary | ICD-10-CM | POA: Diagnosis not present

## 2020-03-02 DIAGNOSIS — R3911 Hesitancy of micturition: Secondary | ICD-10-CM | POA: Diagnosis not present

## 2020-03-02 DIAGNOSIS — S32020A Wedge compression fracture of second lumbar vertebra, initial encounter for closed fracture: Secondary | ICD-10-CM | POA: Diagnosis not present

## 2020-03-02 DIAGNOSIS — H25012 Cortical age-related cataract, left eye: Secondary | ICD-10-CM | POA: Diagnosis not present

## 2020-03-02 DIAGNOSIS — S91302D Unspecified open wound, left foot, subsequent encounter: Secondary | ICD-10-CM | POA: Diagnosis not present

## 2020-03-02 DIAGNOSIS — Z1331 Encounter for screening for depression: Secondary | ICD-10-CM | POA: Diagnosis not present

## 2020-03-02 DIAGNOSIS — Z681 Body mass index (BMI) 19 or less, adult: Secondary | ICD-10-CM | POA: Diagnosis not present

## 2020-03-02 DIAGNOSIS — E291 Testicular hypofunction: Secondary | ICD-10-CM | POA: Diagnosis not present

## 2020-03-02 DIAGNOSIS — C7951 Secondary malignant neoplasm of bone: Secondary | ICD-10-CM | POA: Diagnosis not present

## 2020-03-02 DIAGNOSIS — M1712 Unilateral primary osteoarthritis, left knee: Secondary | ICD-10-CM | POA: Diagnosis not present

## 2020-03-02 DIAGNOSIS — R2243 Localized swelling, mass and lump, lower limb, bilateral: Secondary | ICD-10-CM | POA: Diagnosis not present

## 2020-03-02 DIAGNOSIS — M86671 Other chronic osteomyelitis, right ankle and foot: Secondary | ICD-10-CM | POA: Diagnosis not present

## 2020-03-02 DIAGNOSIS — J40 Bronchitis, not specified as acute or chronic: Secondary | ICD-10-CM | POA: Diagnosis not present

## 2020-03-02 DIAGNOSIS — D494 Neoplasm of unspecified behavior of bladder: Secondary | ICD-10-CM | POA: Diagnosis not present

## 2020-03-02 DIAGNOSIS — R278 Other lack of coordination: Secondary | ICD-10-CM | POA: Diagnosis not present

## 2020-03-02 DIAGNOSIS — R0989 Other specified symptoms and signs involving the circulatory and respiratory systems: Secondary | ICD-10-CM | POA: Diagnosis not present

## 2020-03-02 DIAGNOSIS — N179 Acute kidney failure, unspecified: Secondary | ICD-10-CM | POA: Diagnosis not present

## 2020-03-02 DIAGNOSIS — Z0000 Encounter for general adult medical examination without abnormal findings: Secondary | ICD-10-CM | POA: Diagnosis not present

## 2020-03-02 DIAGNOSIS — Z9911 Dependence on respirator [ventilator] status: Secondary | ICD-10-CM | POA: Diagnosis not present

## 2020-03-02 DIAGNOSIS — J302 Other seasonal allergic rhinitis: Secondary | ICD-10-CM | POA: Diagnosis not present

## 2020-03-02 DIAGNOSIS — D61818 Other pancytopenia: Secondary | ICD-10-CM | POA: Diagnosis not present

## 2020-03-02 DIAGNOSIS — M8588 Other specified disorders of bone density and structure, other site: Secondary | ICD-10-CM | POA: Diagnosis not present

## 2020-03-02 DIAGNOSIS — H02055 Trichiasis without entropian left lower eyelid: Secondary | ICD-10-CM | POA: Diagnosis not present

## 2020-03-02 DIAGNOSIS — S46011D Strain of muscle(s) and tendon(s) of the rotator cuff of right shoulder, subsequent encounter: Secondary | ICD-10-CM | POA: Diagnosis not present

## 2020-03-02 DIAGNOSIS — G8254 Quadriplegia, C5-C7 incomplete: Secondary | ICD-10-CM | POA: Diagnosis not present

## 2020-03-02 DIAGNOSIS — N185 Chronic kidney disease, stage 5: Secondary | ICD-10-CM | POA: Diagnosis not present

## 2020-03-02 DIAGNOSIS — S336XXA Sprain of sacroiliac joint, initial encounter: Secondary | ICD-10-CM | POA: Diagnosis not present

## 2020-03-02 DIAGNOSIS — K228 Other specified diseases of esophagus: Secondary | ICD-10-CM | POA: Diagnosis not present

## 2020-03-02 DIAGNOSIS — M25762 Osteophyte, left knee: Secondary | ICD-10-CM | POA: Diagnosis not present

## 2020-03-02 DIAGNOSIS — H02834 Dermatochalasis of left upper eyelid: Secondary | ICD-10-CM | POA: Diagnosis not present

## 2020-03-02 DIAGNOSIS — C9110 Chronic lymphocytic leukemia of B-cell type not having achieved remission: Secondary | ICD-10-CM | POA: Diagnosis not present

## 2020-03-02 DIAGNOSIS — S8012XA Contusion of left lower leg, initial encounter: Secondary | ICD-10-CM | POA: Diagnosis not present

## 2020-03-02 DIAGNOSIS — C9002 Multiple myeloma in relapse: Secondary | ICD-10-CM | POA: Diagnosis not present

## 2020-03-02 DIAGNOSIS — J301 Allergic rhinitis due to pollen: Secondary | ICD-10-CM | POA: Diagnosis not present

## 2020-03-02 DIAGNOSIS — M25461 Effusion, right knee: Secondary | ICD-10-CM | POA: Diagnosis not present

## 2020-03-02 DIAGNOSIS — M19042 Primary osteoarthritis, left hand: Secondary | ICD-10-CM | POA: Diagnosis not present

## 2020-03-02 DIAGNOSIS — M5137 Other intervertebral disc degeneration, lumbosacral region: Secondary | ICD-10-CM | POA: Diagnosis not present

## 2020-03-02 DIAGNOSIS — Z720 Tobacco use: Secondary | ICD-10-CM | POA: Diagnosis not present

## 2020-03-02 DIAGNOSIS — T466X5A Adverse effect of antihyperlipidemic and antiarteriosclerotic drugs, initial encounter: Secondary | ICD-10-CM | POA: Diagnosis not present

## 2020-03-02 DIAGNOSIS — M25311 Other instability, right shoulder: Secondary | ICD-10-CM | POA: Diagnosis not present

## 2020-03-02 DIAGNOSIS — M797 Fibromyalgia: Secondary | ICD-10-CM | POA: Diagnosis not present

## 2020-03-02 DIAGNOSIS — M65331 Trigger finger, right middle finger: Secondary | ICD-10-CM | POA: Diagnosis not present

## 2020-03-02 DIAGNOSIS — R042 Hemoptysis: Secondary | ICD-10-CM | POA: Diagnosis not present

## 2020-03-02 DIAGNOSIS — Z83511 Family history of glaucoma: Secondary | ICD-10-CM | POA: Diagnosis not present

## 2020-03-02 DIAGNOSIS — M546 Pain in thoracic spine: Secondary | ICD-10-CM | POA: Diagnosis not present

## 2020-03-02 DIAGNOSIS — R911 Solitary pulmonary nodule: Secondary | ICD-10-CM | POA: Diagnosis not present

## 2020-03-02 DIAGNOSIS — Z4789 Encounter for other orthopedic aftercare: Secondary | ICD-10-CM | POA: Diagnosis not present

## 2020-03-02 DIAGNOSIS — S72492A Other fracture of lower end of left femur, initial encounter for closed fracture: Secondary | ICD-10-CM | POA: Diagnosis not present

## 2020-03-02 DIAGNOSIS — R195 Other fecal abnormalities: Secondary | ICD-10-CM | POA: Diagnosis not present

## 2020-03-02 DIAGNOSIS — N3281 Overactive bladder: Secondary | ICD-10-CM | POA: Diagnosis not present

## 2020-03-02 DIAGNOSIS — I25708 Atherosclerosis of coronary artery bypass graft(s), unspecified, with other forms of angina pectoris: Secondary | ICD-10-CM | POA: Diagnosis not present

## 2020-03-02 DIAGNOSIS — M25611 Stiffness of right shoulder, not elsewhere classified: Secondary | ICD-10-CM | POA: Diagnosis not present

## 2020-03-02 DIAGNOSIS — R928 Other abnormal and inconclusive findings on diagnostic imaging of breast: Secondary | ICD-10-CM | POA: Diagnosis not present

## 2020-03-02 DIAGNOSIS — N183 Chronic kidney disease, stage 3 unspecified: Secondary | ICD-10-CM | POA: Diagnosis not present

## 2020-03-02 DIAGNOSIS — Z7902 Long term (current) use of antithrombotics/antiplatelets: Secondary | ICD-10-CM | POA: Diagnosis not present

## 2020-03-02 DIAGNOSIS — H4052X2 Glaucoma secondary to other eye disorders, left eye, moderate stage: Secondary | ICD-10-CM | POA: Diagnosis not present

## 2020-03-02 DIAGNOSIS — S62102G Fracture of unspecified carpal bone, left wrist, subsequent encounter for fracture with delayed healing: Secondary | ICD-10-CM | POA: Diagnosis not present

## 2020-03-02 DIAGNOSIS — M2392 Unspecified internal derangement of left knee: Secondary | ICD-10-CM | POA: Diagnosis not present

## 2020-03-02 DIAGNOSIS — M659 Synovitis and tenosynovitis, unspecified: Secondary | ICD-10-CM | POA: Diagnosis not present

## 2020-03-02 DIAGNOSIS — R030 Elevated blood-pressure reading, without diagnosis of hypertension: Secondary | ICD-10-CM | POA: Diagnosis not present

## 2020-03-02 DIAGNOSIS — C50412 Malignant neoplasm of upper-outer quadrant of left female breast: Secondary | ICD-10-CM | POA: Diagnosis not present

## 2020-03-02 DIAGNOSIS — Z6836 Body mass index (BMI) 36.0-36.9, adult: Secondary | ICD-10-CM | POA: Diagnosis not present

## 2020-03-02 DIAGNOSIS — Z947 Corneal transplant status: Secondary | ICD-10-CM | POA: Diagnosis not present

## 2020-03-02 DIAGNOSIS — D649 Anemia, unspecified: Secondary | ICD-10-CM | POA: Diagnosis not present

## 2020-03-02 DIAGNOSIS — I499 Cardiac arrhythmia, unspecified: Secondary | ICD-10-CM | POA: Diagnosis not present

## 2020-03-02 DIAGNOSIS — Z85528 Personal history of other malignant neoplasm of kidney: Secondary | ICD-10-CM | POA: Diagnosis not present

## 2020-03-02 DIAGNOSIS — Z8673 Personal history of transient ischemic attack (TIA), and cerebral infarction without residual deficits: Secondary | ICD-10-CM | POA: Diagnosis not present

## 2020-03-02 DIAGNOSIS — R350 Frequency of micturition: Secondary | ICD-10-CM | POA: Diagnosis not present

## 2020-03-02 DIAGNOSIS — M359 Systemic involvement of connective tissue, unspecified: Secondary | ICD-10-CM | POA: Diagnosis not present

## 2020-03-02 DIAGNOSIS — F0390 Unspecified dementia without behavioral disturbance: Secondary | ICD-10-CM | POA: Diagnosis not present

## 2020-03-02 DIAGNOSIS — H25812 Combined forms of age-related cataract, left eye: Secondary | ICD-10-CM | POA: Diagnosis not present

## 2020-03-02 DIAGNOSIS — M353 Polymyalgia rheumatica: Secondary | ICD-10-CM | POA: Diagnosis not present

## 2020-03-02 DIAGNOSIS — Z905 Acquired absence of kidney: Secondary | ICD-10-CM | POA: Diagnosis not present

## 2020-03-02 DIAGNOSIS — S51812A Laceration without foreign body of left forearm, initial encounter: Secondary | ICD-10-CM | POA: Diagnosis not present

## 2020-03-02 DIAGNOSIS — C44622 Squamous cell carcinoma of skin of right upper limb, including shoulder: Secondary | ICD-10-CM | POA: Diagnosis not present

## 2020-03-02 DIAGNOSIS — I351 Nonrheumatic aortic (valve) insufficiency: Secondary | ICD-10-CM | POA: Diagnosis not present

## 2020-03-02 DIAGNOSIS — R7303 Prediabetes: Secondary | ICD-10-CM | POA: Diagnosis not present

## 2020-03-02 DIAGNOSIS — Z9181 History of falling: Secondary | ICD-10-CM | POA: Diagnosis not present

## 2020-03-02 DIAGNOSIS — N528 Other male erectile dysfunction: Secondary | ICD-10-CM | POA: Diagnosis not present

## 2020-03-02 DIAGNOSIS — M216X2 Other acquired deformities of left foot: Secondary | ICD-10-CM | POA: Diagnosis not present

## 2020-03-02 DIAGNOSIS — R0781 Pleurodynia: Secondary | ICD-10-CM | POA: Diagnosis not present

## 2020-03-02 DIAGNOSIS — M810 Age-related osteoporosis without current pathological fracture: Secondary | ICD-10-CM | POA: Diagnosis not present

## 2020-03-02 DIAGNOSIS — D0439 Carcinoma in situ of skin of other parts of face: Secondary | ICD-10-CM | POA: Diagnosis not present

## 2020-03-02 DIAGNOSIS — Z89512 Acquired absence of left leg below knee: Secondary | ICD-10-CM | POA: Diagnosis not present

## 2020-03-02 DIAGNOSIS — D1800 Hemangioma unspecified site: Secondary | ICD-10-CM | POA: Diagnosis not present

## 2020-03-02 DIAGNOSIS — M25675 Stiffness of left foot, not elsewhere classified: Secondary | ICD-10-CM | POA: Diagnosis not present

## 2020-03-02 DIAGNOSIS — K5669 Other partial intestinal obstruction: Secondary | ICD-10-CM | POA: Diagnosis not present

## 2020-03-02 DIAGNOSIS — M25532 Pain in left wrist: Secondary | ICD-10-CM | POA: Diagnosis not present

## 2020-03-02 DIAGNOSIS — L4050 Arthropathic psoriasis, unspecified: Secondary | ICD-10-CM | POA: Diagnosis not present

## 2020-03-02 DIAGNOSIS — Z6833 Body mass index (BMI) 33.0-33.9, adult: Secondary | ICD-10-CM | POA: Diagnosis not present

## 2020-03-02 DIAGNOSIS — M25551 Pain in right hip: Secondary | ICD-10-CM | POA: Diagnosis not present

## 2020-03-02 DIAGNOSIS — H43393 Other vitreous opacities, bilateral: Secondary | ICD-10-CM | POA: Diagnosis not present

## 2020-03-02 DIAGNOSIS — I712 Thoracic aortic aneurysm, without rupture: Secondary | ICD-10-CM | POA: Diagnosis not present

## 2020-03-02 DIAGNOSIS — E7849 Other hyperlipidemia: Secondary | ICD-10-CM | POA: Diagnosis not present

## 2020-03-02 DIAGNOSIS — I96 Gangrene, not elsewhere classified: Secondary | ICD-10-CM | POA: Diagnosis not present

## 2020-03-02 DIAGNOSIS — I679 Cerebrovascular disease, unspecified: Secondary | ICD-10-CM | POA: Diagnosis not present

## 2020-03-02 DIAGNOSIS — I6523 Occlusion and stenosis of bilateral carotid arteries: Secondary | ICD-10-CM | POA: Diagnosis not present

## 2020-03-02 DIAGNOSIS — H353221 Exudative age-related macular degeneration, left eye, with active choroidal neovascularization: Secondary | ICD-10-CM | POA: Diagnosis not present

## 2020-03-02 DIAGNOSIS — M8008XA Age-related osteoporosis with current pathological fracture, vertebra(e), initial encounter for fracture: Secondary | ICD-10-CM | POA: Diagnosis not present

## 2020-03-02 DIAGNOSIS — F0281 Dementia in other diseases classified elsewhere with behavioral disturbance: Secondary | ICD-10-CM | POA: Diagnosis not present

## 2020-03-02 DIAGNOSIS — G8929 Other chronic pain: Secondary | ICD-10-CM | POA: Diagnosis not present

## 2020-03-02 DIAGNOSIS — H35443 Age-related reticular degeneration of retina, bilateral: Secondary | ICD-10-CM | POA: Diagnosis not present

## 2020-03-02 DIAGNOSIS — I2782 Chronic pulmonary embolism: Secondary | ICD-10-CM | POA: Diagnosis not present

## 2020-03-02 DIAGNOSIS — J849 Interstitial pulmonary disease, unspecified: Secondary | ICD-10-CM | POA: Diagnosis not present

## 2020-03-02 DIAGNOSIS — R2681 Unsteadiness on feet: Secondary | ICD-10-CM | POA: Diagnosis not present

## 2020-03-02 DIAGNOSIS — E1142 Type 2 diabetes mellitus with diabetic polyneuropathy: Secondary | ICD-10-CM | POA: Diagnosis not present

## 2020-03-02 DIAGNOSIS — M62512 Muscle wasting and atrophy, not elsewhere classified, left shoulder: Secondary | ICD-10-CM | POA: Diagnosis not present

## 2020-03-02 DIAGNOSIS — R0600 Dyspnea, unspecified: Secondary | ICD-10-CM | POA: Diagnosis not present

## 2020-03-02 DIAGNOSIS — M5033 Other cervical disc degeneration, cervicothoracic region: Secondary | ICD-10-CM | POA: Diagnosis not present

## 2020-03-02 DIAGNOSIS — Z95828 Presence of other vascular implants and grafts: Secondary | ICD-10-CM | POA: Diagnosis not present

## 2020-03-02 DIAGNOSIS — Z170 Estrogen receptor positive status [ER+]: Secondary | ICD-10-CM | POA: Diagnosis not present

## 2020-03-02 DIAGNOSIS — H44113 Panuveitis, bilateral: Secondary | ICD-10-CM | POA: Diagnosis not present

## 2020-03-02 DIAGNOSIS — I421 Obstructive hypertrophic cardiomyopathy: Secondary | ICD-10-CM | POA: Diagnosis not present

## 2020-03-02 DIAGNOSIS — T22211A Burn of second degree of right forearm, initial encounter: Secondary | ICD-10-CM | POA: Diagnosis not present

## 2020-03-02 DIAGNOSIS — K862 Cyst of pancreas: Secondary | ICD-10-CM | POA: Diagnosis not present

## 2020-03-02 DIAGNOSIS — M8589 Other specified disorders of bone density and structure, multiple sites: Secondary | ICD-10-CM | POA: Diagnosis not present

## 2020-03-02 DIAGNOSIS — M4807 Spinal stenosis, lumbosacral region: Secondary | ICD-10-CM | POA: Diagnosis not present

## 2020-03-02 DIAGNOSIS — Z0100 Encounter for examination of eyes and vision without abnormal findings: Secondary | ICD-10-CM | POA: Diagnosis not present

## 2020-03-02 DIAGNOSIS — E1129 Type 2 diabetes mellitus with other diabetic kidney complication: Secondary | ICD-10-CM | POA: Diagnosis not present

## 2020-03-02 DIAGNOSIS — G250 Essential tremor: Secondary | ICD-10-CM | POA: Diagnosis not present

## 2020-03-02 DIAGNOSIS — R936 Abnormal findings on diagnostic imaging of limbs: Secondary | ICD-10-CM | POA: Diagnosis not present

## 2020-03-02 DIAGNOSIS — I4891 Unspecified atrial fibrillation: Secondary | ICD-10-CM | POA: Diagnosis not present

## 2020-03-02 DIAGNOSIS — E875 Hyperkalemia: Secondary | ICD-10-CM | POA: Diagnosis not present

## 2020-03-02 DIAGNOSIS — H2513 Age-related nuclear cataract, bilateral: Secondary | ICD-10-CM | POA: Diagnosis not present

## 2020-03-02 DIAGNOSIS — Z6827 Body mass index (BMI) 27.0-27.9, adult: Secondary | ICD-10-CM | POA: Diagnosis not present

## 2020-03-02 DIAGNOSIS — H26491 Other secondary cataract, right eye: Secondary | ICD-10-CM | POA: Diagnosis not present

## 2020-03-02 DIAGNOSIS — S72302A Unspecified fracture of shaft of left femur, initial encounter for closed fracture: Secondary | ICD-10-CM | POA: Diagnosis not present

## 2020-03-02 DIAGNOSIS — M1180 Other specified crystal arthropathies, unspecified site: Secondary | ICD-10-CM | POA: Diagnosis not present

## 2020-03-02 DIAGNOSIS — R1030 Lower abdominal pain, unspecified: Secondary | ICD-10-CM | POA: Diagnosis not present

## 2020-03-02 DIAGNOSIS — N941 Unspecified dyspareunia: Secondary | ICD-10-CM | POA: Diagnosis not present

## 2020-03-02 DIAGNOSIS — D638 Anemia in other chronic diseases classified elsewhere: Secondary | ICD-10-CM | POA: Diagnosis not present

## 2020-03-02 DIAGNOSIS — Z992 Dependence on renal dialysis: Secondary | ICD-10-CM | POA: Diagnosis not present

## 2020-03-02 DIAGNOSIS — M79645 Pain in left finger(s): Secondary | ICD-10-CM | POA: Diagnosis not present

## 2020-03-02 DIAGNOSIS — K51311 Ulcerative (chronic) rectosigmoiditis with rectal bleeding: Secondary | ICD-10-CM | POA: Diagnosis not present

## 2020-03-02 DIAGNOSIS — Z862 Personal history of diseases of the blood and blood-forming organs and certain disorders involving the immune mechanism: Secondary | ICD-10-CM | POA: Diagnosis not present

## 2020-03-02 DIAGNOSIS — M25531 Pain in right wrist: Secondary | ICD-10-CM | POA: Diagnosis not present

## 2020-03-02 DIAGNOSIS — M0609 Rheumatoid arthritis without rheumatoid factor, multiple sites: Secondary | ICD-10-CM | POA: Diagnosis not present

## 2020-03-02 DIAGNOSIS — M4847XD Fatigue fracture of vertebra, lumbosacral region, subsequent encounter for fracture with routine healing: Secondary | ICD-10-CM | POA: Diagnosis not present

## 2020-03-02 DIAGNOSIS — M19041 Primary osteoarthritis, right hand: Secondary | ICD-10-CM | POA: Diagnosis not present

## 2020-03-02 DIAGNOSIS — M65322 Trigger finger, left index finger: Secondary | ICD-10-CM | POA: Diagnosis not present

## 2020-03-02 DIAGNOSIS — G620 Drug-induced polyneuropathy: Secondary | ICD-10-CM | POA: Diagnosis not present

## 2020-03-02 DIAGNOSIS — S80262A Insect bite (nonvenomous), left knee, initial encounter: Secondary | ICD-10-CM | POA: Diagnosis not present

## 2020-03-02 DIAGNOSIS — R55 Syncope and collapse: Secondary | ICD-10-CM | POA: Diagnosis not present

## 2020-03-02 DIAGNOSIS — S66316A Strain of extensor muscle, fascia and tendon of right little finger at wrist and hand level, initial encounter: Secondary | ICD-10-CM | POA: Diagnosis not present

## 2020-03-02 DIAGNOSIS — H906 Mixed conductive and sensorineural hearing loss, bilateral: Secondary | ICD-10-CM | POA: Diagnosis not present

## 2020-03-02 DIAGNOSIS — Z6828 Body mass index (BMI) 28.0-28.9, adult: Secondary | ICD-10-CM | POA: Diagnosis not present

## 2020-03-02 DIAGNOSIS — H903 Sensorineural hearing loss, bilateral: Secondary | ICD-10-CM | POA: Diagnosis not present

## 2020-03-02 DIAGNOSIS — I25810 Atherosclerosis of coronary artery bypass graft(s) without angina pectoris: Secondary | ICD-10-CM | POA: Diagnosis not present

## 2020-03-02 DIAGNOSIS — Z6822 Body mass index (BMI) 22.0-22.9, adult: Secondary | ICD-10-CM | POA: Diagnosis not present

## 2020-03-02 DIAGNOSIS — H518 Other specified disorders of binocular movement: Secondary | ICD-10-CM | POA: Diagnosis not present

## 2020-03-02 DIAGNOSIS — D571 Sickle-cell disease without crisis: Secondary | ICD-10-CM | POA: Diagnosis not present

## 2020-03-02 DIAGNOSIS — T7840XD Allergy, unspecified, subsequent encounter: Secondary | ICD-10-CM | POA: Diagnosis not present

## 2020-03-02 DIAGNOSIS — R3121 Asymptomatic microscopic hematuria: Secondary | ICD-10-CM | POA: Diagnosis not present

## 2020-03-02 DIAGNOSIS — M9904 Segmental and somatic dysfunction of sacral region: Secondary | ICD-10-CM | POA: Diagnosis not present

## 2020-03-02 DIAGNOSIS — H47022 Hemorrhage in optic nerve sheath, left eye: Secondary | ICD-10-CM | POA: Diagnosis not present

## 2020-03-02 DIAGNOSIS — H26492 Other secondary cataract, left eye: Secondary | ICD-10-CM | POA: Diagnosis not present

## 2020-03-02 DIAGNOSIS — F4481 Dissociative identity disorder: Secondary | ICD-10-CM | POA: Diagnosis not present

## 2020-03-02 DIAGNOSIS — F39 Unspecified mood [affective] disorder: Secondary | ICD-10-CM | POA: Diagnosis not present

## 2020-03-02 DIAGNOSIS — J84112 Idiopathic pulmonary fibrosis: Secondary | ICD-10-CM | POA: Diagnosis not present

## 2020-03-02 DIAGNOSIS — I208 Other forms of angina pectoris: Secondary | ICD-10-CM | POA: Diagnosis not present

## 2020-03-02 DIAGNOSIS — I440 Atrioventricular block, first degree: Secondary | ICD-10-CM | POA: Diagnosis not present

## 2020-03-02 DIAGNOSIS — S66314A Strain of extensor muscle, fascia and tendon of right ring finger at wrist and hand level, initial encounter: Secondary | ICD-10-CM | POA: Diagnosis not present

## 2020-03-02 DIAGNOSIS — M228X1 Other disorders of patella, right knee: Secondary | ICD-10-CM | POA: Diagnosis not present

## 2020-03-02 DIAGNOSIS — M549 Dorsalgia, unspecified: Secondary | ICD-10-CM | POA: Diagnosis not present

## 2020-03-02 DIAGNOSIS — R05 Cough: Secondary | ICD-10-CM | POA: Diagnosis not present

## 2020-03-02 DIAGNOSIS — Z86718 Personal history of other venous thrombosis and embolism: Secondary | ICD-10-CM | POA: Diagnosis not present

## 2020-03-02 DIAGNOSIS — H00012 Hordeolum externum right lower eyelid: Secondary | ICD-10-CM | POA: Diagnosis not present

## 2020-03-02 DIAGNOSIS — R768 Other specified abnormal immunological findings in serum: Secondary | ICD-10-CM | POA: Diagnosis not present

## 2020-03-02 DIAGNOSIS — H02002 Unspecified entropion of right lower eyelid: Secondary | ICD-10-CM | POA: Diagnosis not present

## 2020-03-02 DIAGNOSIS — C4491 Basal cell carcinoma of skin, unspecified: Secondary | ICD-10-CM | POA: Diagnosis not present

## 2020-03-02 DIAGNOSIS — F0280 Dementia in other diseases classified elsewhere without behavioral disturbance: Secondary | ICD-10-CM | POA: Diagnosis not present

## 2020-03-02 DIAGNOSIS — C671 Malignant neoplasm of dome of bladder: Secondary | ICD-10-CM | POA: Diagnosis not present

## 2020-03-02 DIAGNOSIS — D508 Other iron deficiency anemias: Secondary | ICD-10-CM | POA: Diagnosis not present

## 2020-03-02 DIAGNOSIS — Z86010 Personal history of colonic polyps: Secondary | ICD-10-CM | POA: Diagnosis not present

## 2020-03-02 DIAGNOSIS — N181 Chronic kidney disease, stage 1: Secondary | ICD-10-CM | POA: Diagnosis not present

## 2020-03-02 DIAGNOSIS — S32010D Wedge compression fracture of first lumbar vertebra, subsequent encounter for fracture with routine healing: Secondary | ICD-10-CM | POA: Diagnosis not present

## 2020-03-02 DIAGNOSIS — E860 Dehydration: Secondary | ICD-10-CM | POA: Diagnosis not present

## 2020-03-02 DIAGNOSIS — H25013 Cortical age-related cataract, bilateral: Secondary | ICD-10-CM | POA: Diagnosis not present

## 2020-03-02 DIAGNOSIS — R0602 Shortness of breath: Secondary | ICD-10-CM | POA: Diagnosis not present

## 2020-03-02 DIAGNOSIS — F3341 Major depressive disorder, recurrent, in partial remission: Secondary | ICD-10-CM | POA: Diagnosis not present

## 2020-03-02 DIAGNOSIS — E782 Mixed hyperlipidemia: Secondary | ICD-10-CM | POA: Diagnosis not present

## 2020-03-02 DIAGNOSIS — Z0001 Encounter for general adult medical examination with abnormal findings: Secondary | ICD-10-CM | POA: Diagnosis not present

## 2020-03-02 DIAGNOSIS — S0990XA Unspecified injury of head, initial encounter: Secondary | ICD-10-CM | POA: Diagnosis not present

## 2020-03-02 DIAGNOSIS — F321 Major depressive disorder, single episode, moderate: Secondary | ICD-10-CM | POA: Diagnosis not present

## 2020-03-02 DIAGNOSIS — M4316 Spondylolisthesis, lumbar region: Secondary | ICD-10-CM | POA: Diagnosis not present

## 2020-03-02 DIAGNOSIS — I5042 Chronic combined systolic (congestive) and diastolic (congestive) heart failure: Secondary | ICD-10-CM | POA: Diagnosis not present

## 2020-03-02 DIAGNOSIS — Z9622 Myringotomy tube(s) status: Secondary | ICD-10-CM | POA: Diagnosis not present

## 2020-03-02 DIAGNOSIS — R921 Mammographic calcification found on diagnostic imaging of breast: Secondary | ICD-10-CM | POA: Diagnosis not present

## 2020-03-02 DIAGNOSIS — J189 Pneumonia, unspecified organism: Secondary | ICD-10-CM | POA: Diagnosis not present

## 2020-03-02 DIAGNOSIS — F17220 Nicotine dependence, chewing tobacco, uncomplicated: Secondary | ICD-10-CM | POA: Diagnosis not present

## 2020-03-02 DIAGNOSIS — Z01812 Encounter for preprocedural laboratory examination: Secondary | ICD-10-CM | POA: Diagnosis not present

## 2020-03-02 DIAGNOSIS — D72819 Decreased white blood cell count, unspecified: Secondary | ICD-10-CM | POA: Diagnosis not present

## 2020-03-02 DIAGNOSIS — B078 Other viral warts: Secondary | ICD-10-CM | POA: Diagnosis not present

## 2020-03-02 DIAGNOSIS — L989 Disorder of the skin and subcutaneous tissue, unspecified: Secondary | ICD-10-CM | POA: Diagnosis not present

## 2020-03-02 DIAGNOSIS — R2689 Other abnormalities of gait and mobility: Secondary | ICD-10-CM | POA: Diagnosis not present

## 2020-03-02 DIAGNOSIS — J441 Chronic obstructive pulmonary disease with (acute) exacerbation: Secondary | ICD-10-CM | POA: Diagnosis not present

## 2020-03-02 DIAGNOSIS — E109 Type 1 diabetes mellitus without complications: Secondary | ICD-10-CM | POA: Diagnosis not present

## 2020-03-02 DIAGNOSIS — N760 Acute vaginitis: Secondary | ICD-10-CM | POA: Diagnosis not present

## 2020-03-02 DIAGNOSIS — M170 Bilateral primary osteoarthritis of knee: Secondary | ICD-10-CM | POA: Diagnosis not present

## 2020-03-02 DIAGNOSIS — H548 Legal blindness, as defined in USA: Secondary | ICD-10-CM | POA: Diagnosis not present

## 2020-03-02 DIAGNOSIS — N4889 Other specified disorders of penis: Secondary | ICD-10-CM | POA: Diagnosis not present

## 2020-03-02 DIAGNOSIS — I5043 Acute on chronic combined systolic (congestive) and diastolic (congestive) heart failure: Secondary | ICD-10-CM | POA: Diagnosis not present

## 2020-03-02 DIAGNOSIS — I720 Aneurysm of carotid artery: Secondary | ICD-10-CM | POA: Diagnosis not present

## 2020-03-02 DIAGNOSIS — Z85828 Personal history of other malignant neoplasm of skin: Secondary | ICD-10-CM | POA: Diagnosis not present

## 2020-03-02 DIAGNOSIS — M25511 Pain in right shoulder: Secondary | ICD-10-CM | POA: Diagnosis not present

## 2020-03-02 DIAGNOSIS — Z8543 Personal history of malignant neoplasm of ovary: Secondary | ICD-10-CM | POA: Diagnosis not present

## 2020-03-02 DIAGNOSIS — N3000 Acute cystitis without hematuria: Secondary | ICD-10-CM | POA: Diagnosis not present

## 2020-03-02 DIAGNOSIS — R200 Anesthesia of skin: Secondary | ICD-10-CM | POA: Diagnosis not present

## 2020-03-02 DIAGNOSIS — N904 Leukoplakia of vulva: Secondary | ICD-10-CM | POA: Diagnosis not present

## 2020-03-02 DIAGNOSIS — E11319 Type 2 diabetes mellitus with unspecified diabetic retinopathy without macular edema: Secondary | ICD-10-CM | POA: Diagnosis not present

## 2020-03-02 DIAGNOSIS — E669 Obesity, unspecified: Secondary | ICD-10-CM | POA: Diagnosis not present

## 2020-03-02 DIAGNOSIS — D631 Anemia in chronic kidney disease: Secondary | ICD-10-CM | POA: Diagnosis not present

## 2020-03-02 DIAGNOSIS — H2512 Age-related nuclear cataract, left eye: Secondary | ICD-10-CM | POA: Diagnosis not present

## 2020-03-02 DIAGNOSIS — L820 Inflamed seborrheic keratosis: Secondary | ICD-10-CM | POA: Diagnosis not present

## 2020-03-02 DIAGNOSIS — I252 Old myocardial infarction: Secondary | ICD-10-CM | POA: Diagnosis not present

## 2020-03-02 DIAGNOSIS — J9811 Atelectasis: Secondary | ICD-10-CM | POA: Diagnosis not present

## 2020-03-02 DIAGNOSIS — M7631 Iliotibial band syndrome, right leg: Secondary | ICD-10-CM | POA: Diagnosis not present

## 2020-03-02 DIAGNOSIS — I82522 Chronic embolism and thrombosis of left iliac vein: Secondary | ICD-10-CM | POA: Diagnosis not present

## 2020-03-02 DIAGNOSIS — Z6823 Body mass index (BMI) 23.0-23.9, adult: Secondary | ICD-10-CM | POA: Diagnosis not present

## 2020-03-02 DIAGNOSIS — M25512 Pain in left shoulder: Secondary | ICD-10-CM | POA: Diagnosis not present

## 2020-03-02 DIAGNOSIS — M62830 Muscle spasm of back: Secondary | ICD-10-CM | POA: Diagnosis not present

## 2020-03-02 DIAGNOSIS — F250 Schizoaffective disorder, bipolar type: Secondary | ICD-10-CM | POA: Diagnosis not present

## 2020-03-02 DIAGNOSIS — R778 Other specified abnormalities of plasma proteins: Secondary | ICD-10-CM | POA: Diagnosis not present

## 2020-03-02 DIAGNOSIS — Z192 Hormone resistant malignancy status: Secondary | ICD-10-CM | POA: Diagnosis not present

## 2020-03-02 DIAGNOSIS — R310 Gross hematuria: Secondary | ICD-10-CM | POA: Diagnosis not present

## 2020-03-02 DIAGNOSIS — M2550 Pain in unspecified joint: Secondary | ICD-10-CM | POA: Diagnosis not present

## 2020-03-02 DIAGNOSIS — R000 Tachycardia, unspecified: Secondary | ICD-10-CM | POA: Diagnosis not present

## 2020-03-02 DIAGNOSIS — J060 Acute laryngopharyngitis: Secondary | ICD-10-CM | POA: Diagnosis not present

## 2020-03-02 DIAGNOSIS — D2372 Other benign neoplasm of skin of left lower limb, including hip: Secondary | ICD-10-CM | POA: Diagnosis not present

## 2020-03-02 DIAGNOSIS — C50411 Malignant neoplasm of upper-outer quadrant of right female breast: Secondary | ICD-10-CM | POA: Diagnosis not present

## 2020-03-02 DIAGNOSIS — Z96611 Presence of right artificial shoulder joint: Secondary | ICD-10-CM | POA: Diagnosis not present

## 2020-03-02 DIAGNOSIS — D125 Benign neoplasm of sigmoid colon: Secondary | ICD-10-CM | POA: Diagnosis not present

## 2020-03-02 DIAGNOSIS — S41101D Unspecified open wound of right upper arm, subsequent encounter: Secondary | ICD-10-CM | POA: Diagnosis not present

## 2020-03-02 DIAGNOSIS — I77810 Thoracic aortic ectasia: Secondary | ICD-10-CM | POA: Diagnosis not present

## 2020-03-02 DIAGNOSIS — E611 Iron deficiency: Secondary | ICD-10-CM | POA: Diagnosis not present

## 2020-03-02 DIAGNOSIS — M7541 Impingement syndrome of right shoulder: Secondary | ICD-10-CM | POA: Diagnosis not present

## 2020-03-02 DIAGNOSIS — L97424 Non-pressure chronic ulcer of left heel and midfoot with necrosis of bone: Secondary | ICD-10-CM | POA: Diagnosis not present

## 2020-03-02 DIAGNOSIS — E0590 Thyrotoxicosis, unspecified without thyrotoxic crisis or storm: Secondary | ICD-10-CM | POA: Diagnosis not present

## 2020-03-02 DIAGNOSIS — I878 Other specified disorders of veins: Secondary | ICD-10-CM | POA: Diagnosis not present

## 2020-03-02 DIAGNOSIS — R29818 Other symptoms and signs involving the nervous system: Secondary | ICD-10-CM | POA: Diagnosis not present

## 2020-03-02 DIAGNOSIS — D61810 Antineoplastic chemotherapy induced pancytopenia: Secondary | ICD-10-CM | POA: Diagnosis not present

## 2020-03-02 DIAGNOSIS — L97509 Non-pressure chronic ulcer of other part of unspecified foot with unspecified severity: Secondary | ICD-10-CM | POA: Diagnosis not present

## 2020-03-02 DIAGNOSIS — C538 Malignant neoplasm of overlapping sites of cervix uteri: Secondary | ICD-10-CM | POA: Diagnosis not present

## 2020-03-02 DIAGNOSIS — R5383 Other fatigue: Secondary | ICD-10-CM | POA: Diagnosis not present

## 2020-03-02 DIAGNOSIS — H9201 Otalgia, right ear: Secondary | ICD-10-CM | POA: Diagnosis not present

## 2020-03-02 DIAGNOSIS — G9341 Metabolic encephalopathy: Secondary | ICD-10-CM | POA: Diagnosis not present

## 2020-03-02 DIAGNOSIS — S46001A Unspecified injury of muscle(s) and tendon(s) of the rotator cuff of right shoulder, initial encounter: Secondary | ICD-10-CM | POA: Diagnosis not present

## 2020-03-02 DIAGNOSIS — M79602 Pain in left arm: Secondary | ICD-10-CM | POA: Diagnosis not present

## 2020-03-02 DIAGNOSIS — D485 Neoplasm of uncertain behavior of skin: Secondary | ICD-10-CM | POA: Diagnosis not present

## 2020-03-02 DIAGNOSIS — F3342 Major depressive disorder, recurrent, in full remission: Secondary | ICD-10-CM | POA: Diagnosis not present

## 2020-03-02 DIAGNOSIS — N312 Flaccid neuropathic bladder, not elsewhere classified: Secondary | ICD-10-CM | POA: Diagnosis not present

## 2020-03-02 DIAGNOSIS — Z8774 Personal history of (corrected) congenital malformations of heart and circulatory system: Secondary | ICD-10-CM | POA: Diagnosis not present

## 2020-03-02 DIAGNOSIS — Z299 Encounter for prophylactic measures, unspecified: Secondary | ICD-10-CM | POA: Diagnosis not present

## 2020-03-02 DIAGNOSIS — J431 Panlobular emphysema: Secondary | ICD-10-CM | POA: Diagnosis not present

## 2020-03-02 DIAGNOSIS — N2889 Other specified disorders of kidney and ureter: Secondary | ICD-10-CM | POA: Diagnosis not present

## 2020-03-02 DIAGNOSIS — M76891 Other specified enthesopathies of right lower limb, excluding foot: Secondary | ICD-10-CM | POA: Diagnosis not present

## 2020-03-02 DIAGNOSIS — K7400 Hepatic fibrosis, unspecified: Secondary | ICD-10-CM | POA: Diagnosis not present

## 2020-03-02 DIAGNOSIS — H02135 Senile ectropion of left lower eyelid: Secondary | ICD-10-CM | POA: Diagnosis not present

## 2020-03-02 DIAGNOSIS — J1520 Pneumonia due to staphylococcus, unspecified: Secondary | ICD-10-CM | POA: Diagnosis not present

## 2020-03-02 DIAGNOSIS — B3781 Candidal esophagitis: Secondary | ICD-10-CM | POA: Diagnosis not present

## 2020-03-02 DIAGNOSIS — M6281 Muscle weakness (generalized): Secondary | ICD-10-CM | POA: Diagnosis not present

## 2020-03-02 DIAGNOSIS — E876 Hypokalemia: Secondary | ICD-10-CM | POA: Diagnosis not present

## 2020-03-02 DIAGNOSIS — B372 Candidiasis of skin and nail: Secondary | ICD-10-CM | POA: Diagnosis not present

## 2020-03-02 DIAGNOSIS — I272 Pulmonary hypertension, unspecified: Secondary | ICD-10-CM | POA: Diagnosis not present

## 2020-03-02 DIAGNOSIS — I472 Ventricular tachycardia: Secondary | ICD-10-CM | POA: Diagnosis not present

## 2020-03-02 DIAGNOSIS — M85851 Other specified disorders of bone density and structure, right thigh: Secondary | ICD-10-CM | POA: Diagnosis not present

## 2020-03-02 DIAGNOSIS — K220 Achalasia of cardia: Secondary | ICD-10-CM | POA: Diagnosis not present

## 2020-03-02 DIAGNOSIS — R6520 Severe sepsis without septic shock: Secondary | ICD-10-CM | POA: Diagnosis not present

## 2020-03-02 DIAGNOSIS — E1152 Type 2 diabetes mellitus with diabetic peripheral angiopathy with gangrene: Secondary | ICD-10-CM | POA: Diagnosis not present

## 2020-03-02 DIAGNOSIS — H25043 Posterior subcapsular polar age-related cataract, bilateral: Secondary | ICD-10-CM | POA: Diagnosis not present

## 2020-03-02 DIAGNOSIS — J4530 Mild persistent asthma, uncomplicated: Secondary | ICD-10-CM | POA: Diagnosis not present

## 2020-03-02 DIAGNOSIS — E1136 Type 2 diabetes mellitus with diabetic cataract: Secondary | ICD-10-CM | POA: Diagnosis not present

## 2020-03-02 DIAGNOSIS — R040 Epistaxis: Secondary | ICD-10-CM | POA: Diagnosis not present

## 2020-03-02 DIAGNOSIS — F320 Major depressive disorder, single episode, mild: Secondary | ICD-10-CM | POA: Diagnosis not present

## 2020-03-02 DIAGNOSIS — M62551 Muscle wasting and atrophy, not elsewhere classified, right thigh: Secondary | ICD-10-CM | POA: Diagnosis not present

## 2020-03-02 DIAGNOSIS — H35071 Retinal telangiectasis, right eye: Secondary | ICD-10-CM | POA: Diagnosis not present

## 2020-03-02 DIAGNOSIS — Z950 Presence of cardiac pacemaker: Secondary | ICD-10-CM | POA: Diagnosis not present

## 2020-03-02 DIAGNOSIS — E039 Hypothyroidism, unspecified: Secondary | ICD-10-CM | POA: Diagnosis not present

## 2020-03-02 DIAGNOSIS — M9903 Segmental and somatic dysfunction of lumbar region: Secondary | ICD-10-CM | POA: Diagnosis not present

## 2020-03-02 DIAGNOSIS — M79605 Pain in left leg: Secondary | ICD-10-CM | POA: Diagnosis not present

## 2020-03-02 DIAGNOSIS — Z1231 Encounter for screening mammogram for malignant neoplasm of breast: Secondary | ICD-10-CM | POA: Diagnosis not present

## 2020-03-02 DIAGNOSIS — Z1159 Encounter for screening for other viral diseases: Secondary | ICD-10-CM | POA: Diagnosis not present

## 2020-03-02 DIAGNOSIS — I771 Stricture of artery: Secondary | ICD-10-CM | POA: Diagnosis not present

## 2020-03-02 DIAGNOSIS — E349 Endocrine disorder, unspecified: Secondary | ICD-10-CM | POA: Diagnosis not present

## 2020-03-02 DIAGNOSIS — I70293 Other atherosclerosis of native arteries of extremities, bilateral legs: Secondary | ICD-10-CM | POA: Diagnosis not present

## 2020-03-02 DIAGNOSIS — G894 Chronic pain syndrome: Secondary | ICD-10-CM | POA: Diagnosis not present

## 2020-03-02 DIAGNOSIS — D224 Melanocytic nevi of scalp and neck: Secondary | ICD-10-CM | POA: Diagnosis not present

## 2020-03-02 DIAGNOSIS — L812 Freckles: Secondary | ICD-10-CM | POA: Diagnosis not present

## 2020-03-02 DIAGNOSIS — Z01818 Encounter for other preprocedural examination: Secondary | ICD-10-CM | POA: Diagnosis not present

## 2020-03-02 DIAGNOSIS — M85859 Other specified disorders of bone density and structure, unspecified thigh: Secondary | ICD-10-CM | POA: Diagnosis not present

## 2020-03-02 DIAGNOSIS — B001 Herpesviral vesicular dermatitis: Secondary | ICD-10-CM | POA: Diagnosis not present

## 2020-03-02 DIAGNOSIS — Z96642 Presence of left artificial hip joint: Secondary | ICD-10-CM | POA: Diagnosis not present

## 2020-03-02 DIAGNOSIS — I7300 Raynaud's syndrome without gangrene: Secondary | ICD-10-CM | POA: Diagnosis not present

## 2020-03-02 DIAGNOSIS — R1011 Right upper quadrant pain: Secondary | ICD-10-CM | POA: Diagnosis not present

## 2020-03-02 DIAGNOSIS — I959 Hypotension, unspecified: Secondary | ICD-10-CM | POA: Diagnosis not present

## 2020-03-02 DIAGNOSIS — L259 Unspecified contact dermatitis, unspecified cause: Secondary | ICD-10-CM | POA: Diagnosis not present

## 2020-03-02 DIAGNOSIS — H18603 Keratoconus, unspecified, bilateral: Secondary | ICD-10-CM | POA: Diagnosis not present

## 2020-03-02 DIAGNOSIS — E1159 Type 2 diabetes mellitus with other circulatory complications: Secondary | ICD-10-CM | POA: Diagnosis not present

## 2020-03-02 DIAGNOSIS — C50511 Malignant neoplasm of lower-outer quadrant of right female breast: Secondary | ICD-10-CM | POA: Diagnosis not present

## 2020-03-02 DIAGNOSIS — X32XXXD Exposure to sunlight, subsequent encounter: Secondary | ICD-10-CM | POA: Diagnosis not present

## 2020-03-02 DIAGNOSIS — M4726 Other spondylosis with radiculopathy, lumbar region: Secondary | ICD-10-CM | POA: Diagnosis not present

## 2020-03-02 DIAGNOSIS — R634 Abnormal weight loss: Secondary | ICD-10-CM | POA: Diagnosis not present

## 2020-03-02 DIAGNOSIS — E034 Atrophy of thyroid (acquired): Secondary | ICD-10-CM | POA: Diagnosis not present

## 2020-03-02 DIAGNOSIS — H11153 Pinguecula, bilateral: Secondary | ICD-10-CM | POA: Diagnosis not present

## 2020-03-02 DIAGNOSIS — H6123 Impacted cerumen, bilateral: Secondary | ICD-10-CM | POA: Diagnosis not present

## 2020-03-02 DIAGNOSIS — C61 Malignant neoplasm of prostate: Secondary | ICD-10-CM | POA: Diagnosis not present

## 2020-03-02 DIAGNOSIS — G43109 Migraine with aura, not intractable, without status migrainosus: Secondary | ICD-10-CM | POA: Diagnosis not present

## 2020-03-02 DIAGNOSIS — G893 Neoplasm related pain (acute) (chronic): Secondary | ICD-10-CM | POA: Diagnosis not present

## 2020-03-02 DIAGNOSIS — N3010 Interstitial cystitis (chronic) without hematuria: Secondary | ICD-10-CM | POA: Diagnosis not present

## 2020-03-02 DIAGNOSIS — M79674 Pain in right toe(s): Secondary | ICD-10-CM | POA: Diagnosis not present

## 2020-03-02 DIAGNOSIS — F0391 Unspecified dementia with behavioral disturbance: Secondary | ICD-10-CM | POA: Diagnosis not present

## 2020-03-02 DIAGNOSIS — N281 Cyst of kidney, acquired: Secondary | ICD-10-CM | POA: Diagnosis not present

## 2020-03-02 DIAGNOSIS — Z713 Dietary counseling and surveillance: Secondary | ICD-10-CM | POA: Diagnosis not present

## 2020-03-02 DIAGNOSIS — Z780 Asymptomatic menopausal state: Secondary | ICD-10-CM | POA: Diagnosis not present

## 2020-03-02 DIAGNOSIS — J432 Centrilobular emphysema: Secondary | ICD-10-CM | POA: Diagnosis not present

## 2020-03-02 DIAGNOSIS — R82998 Other abnormal findings in urine: Secondary | ICD-10-CM | POA: Diagnosis not present

## 2020-03-02 DIAGNOSIS — K224 Dyskinesia of esophagus: Secondary | ICD-10-CM | POA: Diagnosis not present

## 2020-03-02 DIAGNOSIS — G5621 Lesion of ulnar nerve, right upper limb: Secondary | ICD-10-CM | POA: Diagnosis not present

## 2020-03-02 DIAGNOSIS — Z4781 Encounter for orthopedic aftercare following surgical amputation: Secondary | ICD-10-CM | POA: Diagnosis not present

## 2020-03-02 DIAGNOSIS — S32592D Other specified fracture of left pubis, subsequent encounter for fracture with routine healing: Secondary | ICD-10-CM | POA: Diagnosis not present

## 2020-03-02 DIAGNOSIS — N201 Calculus of ureter: Secondary | ICD-10-CM | POA: Diagnosis not present

## 2020-03-02 DIAGNOSIS — G959 Disease of spinal cord, unspecified: Secondary | ICD-10-CM | POA: Diagnosis not present

## 2020-03-02 DIAGNOSIS — C44722 Squamous cell carcinoma of skin of right lower limb, including hip: Secondary | ICD-10-CM | POA: Diagnosis not present

## 2020-03-02 DIAGNOSIS — N400 Enlarged prostate without lower urinary tract symptoms: Secondary | ICD-10-CM | POA: Diagnosis not present

## 2020-03-02 DIAGNOSIS — N61 Mastitis without abscess: Secondary | ICD-10-CM | POA: Diagnosis not present

## 2020-03-02 DIAGNOSIS — R112 Nausea with vomiting, unspecified: Secondary | ICD-10-CM | POA: Diagnosis not present

## 2020-03-02 DIAGNOSIS — R140 Abdominal distension (gaseous): Secondary | ICD-10-CM | POA: Diagnosis not present

## 2020-03-02 DIAGNOSIS — N644 Mastodynia: Secondary | ICD-10-CM | POA: Diagnosis not present

## 2020-03-02 DIAGNOSIS — L858 Other specified epidermal thickening: Secondary | ICD-10-CM | POA: Diagnosis not present

## 2020-03-02 DIAGNOSIS — G5601 Carpal tunnel syndrome, right upper limb: Secondary | ICD-10-CM | POA: Diagnosis not present

## 2020-03-02 DIAGNOSIS — L245 Irritant contact dermatitis due to other chemical products: Secondary | ICD-10-CM | POA: Diagnosis not present

## 2020-03-02 DIAGNOSIS — Z9889 Other specified postprocedural states: Secondary | ICD-10-CM | POA: Diagnosis not present

## 2020-03-02 DIAGNOSIS — I442 Atrioventricular block, complete: Secondary | ICD-10-CM | POA: Diagnosis not present

## 2020-03-02 DIAGNOSIS — I5030 Unspecified diastolic (congestive) heart failure: Secondary | ICD-10-CM | POA: Diagnosis not present

## 2020-03-02 DIAGNOSIS — R636 Underweight: Secondary | ICD-10-CM | POA: Diagnosis not present

## 2020-03-02 DIAGNOSIS — H6122 Impacted cerumen, left ear: Secondary | ICD-10-CM | POA: Diagnosis not present

## 2020-03-02 DIAGNOSIS — Z91030 Bee allergy status: Secondary | ICD-10-CM | POA: Diagnosis not present

## 2020-03-02 DIAGNOSIS — G90521 Complex regional pain syndrome I of right lower limb: Secondary | ICD-10-CM | POA: Diagnosis not present

## 2020-03-02 DIAGNOSIS — Z48816 Encounter for surgical aftercare following surgery on the genitourinary system: Secondary | ICD-10-CM | POA: Diagnosis not present

## 2020-03-02 DIAGNOSIS — M791 Myalgia, unspecified site: Secondary | ICD-10-CM | POA: Diagnosis not present

## 2020-03-02 DIAGNOSIS — H8101 Meniere's disease, right ear: Secondary | ICD-10-CM | POA: Diagnosis not present

## 2020-03-02 DIAGNOSIS — J8410 Pulmonary fibrosis, unspecified: Secondary | ICD-10-CM | POA: Diagnosis not present

## 2020-03-02 DIAGNOSIS — D6859 Other primary thrombophilia: Secondary | ICD-10-CM | POA: Diagnosis not present

## 2020-03-02 DIAGNOSIS — K469 Unspecified abdominal hernia without obstruction or gangrene: Secondary | ICD-10-CM | POA: Diagnosis not present

## 2020-03-02 DIAGNOSIS — K9189 Other postprocedural complications and disorders of digestive system: Secondary | ICD-10-CM | POA: Diagnosis not present

## 2020-03-02 DIAGNOSIS — M4723 Other spondylosis with radiculopathy, cervicothoracic region: Secondary | ICD-10-CM | POA: Diagnosis not present

## 2020-03-02 DIAGNOSIS — H9313 Tinnitus, bilateral: Secondary | ICD-10-CM | POA: Diagnosis not present

## 2020-03-02 DIAGNOSIS — L810 Postinflammatory hyperpigmentation: Secondary | ICD-10-CM | POA: Diagnosis not present

## 2020-03-02 DIAGNOSIS — J679 Hypersensitivity pneumonitis due to unspecified organic dust: Secondary | ICD-10-CM | POA: Diagnosis not present

## 2020-03-02 DIAGNOSIS — M539 Dorsopathy, unspecified: Secondary | ICD-10-CM | POA: Diagnosis not present

## 2020-03-02 DIAGNOSIS — N200 Calculus of kidney: Secondary | ICD-10-CM | POA: Diagnosis not present

## 2020-03-02 DIAGNOSIS — I447 Left bundle-branch block, unspecified: Secondary | ICD-10-CM | POA: Diagnosis not present

## 2020-03-02 DIAGNOSIS — E1065 Type 1 diabetes mellitus with hyperglycemia: Secondary | ICD-10-CM | POA: Diagnosis not present

## 2020-03-02 DIAGNOSIS — H353222 Exudative age-related macular degeneration, left eye, with inactive choroidal neovascularization: Secondary | ICD-10-CM | POA: Diagnosis not present

## 2020-03-02 DIAGNOSIS — E46 Unspecified protein-calorie malnutrition: Secondary | ICD-10-CM | POA: Diagnosis not present

## 2020-03-02 DIAGNOSIS — I129 Hypertensive chronic kidney disease with stage 1 through stage 4 chronic kidney disease, or unspecified chronic kidney disease: Secondary | ICD-10-CM | POA: Diagnosis not present

## 2020-03-02 DIAGNOSIS — G629 Polyneuropathy, unspecified: Secondary | ICD-10-CM | POA: Diagnosis not present

## 2020-03-02 DIAGNOSIS — G2581 Restless legs syndrome: Secondary | ICD-10-CM | POA: Diagnosis not present

## 2020-03-02 DIAGNOSIS — M79672 Pain in left foot: Secondary | ICD-10-CM | POA: Diagnosis not present

## 2020-03-02 DIAGNOSIS — Z20822 Contact with and (suspected) exposure to covid-19: Secondary | ICD-10-CM | POA: Diagnosis not present

## 2020-03-02 DIAGNOSIS — M85852 Other specified disorders of bone density and structure, left thigh: Secondary | ICD-10-CM | POA: Diagnosis not present

## 2020-03-02 DIAGNOSIS — E063 Autoimmune thyroiditis: Secondary | ICD-10-CM | POA: Diagnosis not present

## 2020-03-02 DIAGNOSIS — Z96651 Presence of right artificial knee joint: Secondary | ICD-10-CM | POA: Diagnosis not present

## 2020-03-02 DIAGNOSIS — M3500 Sicca syndrome, unspecified: Secondary | ICD-10-CM | POA: Diagnosis not present

## 2020-03-02 DIAGNOSIS — H25813 Combined forms of age-related cataract, bilateral: Secondary | ICD-10-CM | POA: Diagnosis not present

## 2020-03-02 DIAGNOSIS — C44319 Basal cell carcinoma of skin of other parts of face: Secondary | ICD-10-CM | POA: Diagnosis not present

## 2020-03-02 DIAGNOSIS — L905 Scar conditions and fibrosis of skin: Secondary | ICD-10-CM | POA: Diagnosis not present

## 2020-03-02 DIAGNOSIS — R42 Dizziness and giddiness: Secondary | ICD-10-CM | POA: Diagnosis not present

## 2020-03-02 DIAGNOSIS — R319 Hematuria, unspecified: Secondary | ICD-10-CM | POA: Diagnosis not present

## 2020-03-02 DIAGNOSIS — Z79811 Long term (current) use of aromatase inhibitors: Secondary | ICD-10-CM | POA: Diagnosis not present

## 2020-03-02 DIAGNOSIS — L258 Unspecified contact dermatitis due to other agents: Secondary | ICD-10-CM | POA: Diagnosis not present

## 2020-03-02 DIAGNOSIS — S52571A Other intraarticular fracture of lower end of right radius, initial encounter for closed fracture: Secondary | ICD-10-CM | POA: Diagnosis not present

## 2020-03-02 DIAGNOSIS — S32030B Wedge compression fracture of third lumbar vertebra, initial encounter for open fracture: Secondary | ICD-10-CM | POA: Diagnosis not present

## 2020-03-02 DIAGNOSIS — M25629 Stiffness of unspecified elbow, not elsewhere classified: Secondary | ICD-10-CM | POA: Diagnosis not present

## 2020-03-02 DIAGNOSIS — I2699 Other pulmonary embolism without acute cor pulmonale: Secondary | ICD-10-CM | POA: Diagnosis not present

## 2020-03-02 DIAGNOSIS — E8881 Metabolic syndrome: Secondary | ICD-10-CM | POA: Diagnosis not present

## 2020-03-02 DIAGNOSIS — N6012 Diffuse cystic mastopathy of left breast: Secondary | ICD-10-CM | POA: Diagnosis not present

## 2020-03-02 DIAGNOSIS — M75102 Unspecified rotator cuff tear or rupture of left shoulder, not specified as traumatic: Secondary | ICD-10-CM | POA: Diagnosis not present

## 2020-03-02 DIAGNOSIS — H26493 Other secondary cataract, bilateral: Secondary | ICD-10-CM | POA: Diagnosis not present

## 2020-03-02 DIAGNOSIS — R296 Repeated falls: Secondary | ICD-10-CM | POA: Diagnosis not present

## 2020-03-02 DIAGNOSIS — J3489 Other specified disorders of nose and nasal sinuses: Secondary | ICD-10-CM | POA: Diagnosis not present

## 2020-03-02 DIAGNOSIS — Z8709 Personal history of other diseases of the respiratory system: Secondary | ICD-10-CM | POA: Diagnosis not present

## 2020-03-02 DIAGNOSIS — N319 Neuromuscular dysfunction of bladder, unspecified: Secondary | ICD-10-CM | POA: Diagnosis not present

## 2020-03-02 DIAGNOSIS — S42202D Unspecified fracture of upper end of left humerus, subsequent encounter for fracture with routine healing: Secondary | ICD-10-CM | POA: Diagnosis not present

## 2020-03-02 DIAGNOSIS — I425 Other restrictive cardiomyopathy: Secondary | ICD-10-CM | POA: Diagnosis not present

## 2020-03-02 DIAGNOSIS — N951 Menopausal and female climacteric states: Secondary | ICD-10-CM | POA: Diagnosis not present

## 2020-03-02 DIAGNOSIS — F330 Major depressive disorder, recurrent, mild: Secondary | ICD-10-CM | POA: Diagnosis not present

## 2020-03-02 DIAGNOSIS — D252 Subserosal leiomyoma of uterus: Secondary | ICD-10-CM | POA: Diagnosis not present

## 2020-03-02 DIAGNOSIS — R531 Weakness: Secondary | ICD-10-CM | POA: Diagnosis not present

## 2020-03-02 DIAGNOSIS — I5189 Other ill-defined heart diseases: Secondary | ICD-10-CM | POA: Diagnosis not present

## 2020-03-02 DIAGNOSIS — M75122 Complete rotator cuff tear or rupture of left shoulder, not specified as traumatic: Secondary | ICD-10-CM | POA: Diagnosis not present

## 2020-03-02 DIAGNOSIS — S83231A Complex tear of medial meniscus, current injury, right knee, initial encounter: Secondary | ICD-10-CM | POA: Diagnosis not present

## 2020-03-02 DIAGNOSIS — K643 Fourth degree hemorrhoids: Secondary | ICD-10-CM | POA: Diagnosis not present

## 2020-03-02 DIAGNOSIS — W19XXXA Unspecified fall, initial encounter: Secondary | ICD-10-CM | POA: Diagnosis not present

## 2020-03-02 DIAGNOSIS — I200 Unstable angina: Secondary | ICD-10-CM | POA: Diagnosis not present

## 2020-03-02 DIAGNOSIS — M79675 Pain in left toe(s): Secondary | ICD-10-CM | POA: Diagnosis not present

## 2020-03-02 DIAGNOSIS — M19072 Primary osteoarthritis, left ankle and foot: Secondary | ICD-10-CM | POA: Diagnosis not present

## 2020-03-02 DIAGNOSIS — Z79890 Hormone replacement therapy (postmenopausal): Secondary | ICD-10-CM | POA: Diagnosis not present

## 2020-03-02 DIAGNOSIS — H5703 Miosis: Secondary | ICD-10-CM | POA: Diagnosis not present

## 2020-03-02 DIAGNOSIS — M109 Gout, unspecified: Secondary | ICD-10-CM | POA: Diagnosis not present

## 2020-03-02 DIAGNOSIS — R202 Paresthesia of skin: Secondary | ICD-10-CM | POA: Diagnosis not present

## 2020-03-02 DIAGNOSIS — F0150 Vascular dementia without behavioral disturbance: Secondary | ICD-10-CM | POA: Diagnosis not present

## 2020-03-02 DIAGNOSIS — M21612 Bunion of left foot: Secondary | ICD-10-CM | POA: Diagnosis not present

## 2020-03-02 DIAGNOSIS — F411 Generalized anxiety disorder: Secondary | ICD-10-CM | POA: Diagnosis not present

## 2020-03-02 DIAGNOSIS — M47816 Spondylosis without myelopathy or radiculopathy, lumbar region: Secondary | ICD-10-CM | POA: Diagnosis not present

## 2020-03-02 DIAGNOSIS — M545 Low back pain: Secondary | ICD-10-CM | POA: Diagnosis not present

## 2020-03-02 DIAGNOSIS — E113392 Type 2 diabetes mellitus with moderate nonproliferative diabetic retinopathy without macular edema, left eye: Secondary | ICD-10-CM | POA: Diagnosis not present

## 2020-03-02 DIAGNOSIS — I340 Nonrheumatic mitral (valve) insufficiency: Secondary | ICD-10-CM | POA: Diagnosis not present

## 2020-03-02 DIAGNOSIS — F329 Major depressive disorder, single episode, unspecified: Secondary | ICD-10-CM | POA: Diagnosis not present

## 2020-03-02 DIAGNOSIS — F322 Major depressive disorder, single episode, severe without psychotic features: Secondary | ICD-10-CM | POA: Diagnosis not present

## 2020-03-02 DIAGNOSIS — M159 Polyosteoarthritis, unspecified: Secondary | ICD-10-CM | POA: Diagnosis not present

## 2020-03-02 DIAGNOSIS — N2581 Secondary hyperparathyroidism of renal origin: Secondary | ICD-10-CM | POA: Diagnosis not present

## 2020-03-02 DIAGNOSIS — D581 Hereditary elliptocytosis: Secondary | ICD-10-CM | POA: Diagnosis not present

## 2020-03-02 DIAGNOSIS — M25622 Stiffness of left elbow, not elsewhere classified: Secondary | ICD-10-CM | POA: Diagnosis not present

## 2020-03-02 DIAGNOSIS — I480 Paroxysmal atrial fibrillation: Secondary | ICD-10-CM | POA: Diagnosis not present

## 2020-03-02 DIAGNOSIS — N401 Enlarged prostate with lower urinary tract symptoms: Secondary | ICD-10-CM | POA: Diagnosis not present

## 2020-03-02 DIAGNOSIS — L03113 Cellulitis of right upper limb: Secondary | ICD-10-CM | POA: Diagnosis not present

## 2020-03-02 DIAGNOSIS — E041 Nontoxic single thyroid nodule: Secondary | ICD-10-CM | POA: Diagnosis not present

## 2020-03-02 DIAGNOSIS — F3111 Bipolar disorder, current episode manic without psychotic features, mild: Secondary | ICD-10-CM | POA: Diagnosis not present

## 2020-03-02 DIAGNOSIS — M5412 Radiculopathy, cervical region: Secondary | ICD-10-CM | POA: Diagnosis not present

## 2020-03-02 DIAGNOSIS — M25562 Pain in left knee: Secondary | ICD-10-CM | POA: Diagnosis not present

## 2020-03-02 DIAGNOSIS — D62 Acute posthemorrhagic anemia: Secondary | ICD-10-CM | POA: Diagnosis not present

## 2020-03-02 DIAGNOSIS — E118 Type 2 diabetes mellitus with unspecified complications: Secondary | ICD-10-CM | POA: Diagnosis not present

## 2020-03-02 DIAGNOSIS — K2270 Barrett's esophagus without dysplasia: Secondary | ICD-10-CM | POA: Diagnosis not present

## 2020-03-02 DIAGNOSIS — R5381 Other malaise: Secondary | ICD-10-CM | POA: Diagnosis not present

## 2020-03-02 DIAGNOSIS — L97522 Non-pressure chronic ulcer of other part of left foot with fat layer exposed: Secondary | ICD-10-CM | POA: Diagnosis not present

## 2020-03-02 DIAGNOSIS — L218 Other seborrheic dermatitis: Secondary | ICD-10-CM | POA: Diagnosis not present

## 2020-03-02 DIAGNOSIS — H35722 Serous detachment of retinal pigment epithelium, left eye: Secondary | ICD-10-CM | POA: Diagnosis not present

## 2020-03-02 DIAGNOSIS — D510 Vitamin B12 deficiency anemia due to intrinsic factor deficiency: Secondary | ICD-10-CM | POA: Diagnosis not present

## 2020-03-02 DIAGNOSIS — B951 Streptococcus, group B, as the cause of diseases classified elsewhere: Secondary | ICD-10-CM | POA: Diagnosis not present

## 2020-03-02 DIAGNOSIS — Z6831 Body mass index (BMI) 31.0-31.9, adult: Secondary | ICD-10-CM | POA: Diagnosis not present

## 2020-03-02 DIAGNOSIS — C50919 Malignant neoplasm of unspecified site of unspecified female breast: Secondary | ICD-10-CM | POA: Diagnosis not present

## 2020-03-02 DIAGNOSIS — I63311 Cerebral infarction due to thrombosis of right middle cerebral artery: Secondary | ICD-10-CM | POA: Diagnosis not present

## 2020-03-02 DIAGNOSIS — M79651 Pain in right thigh: Secondary | ICD-10-CM | POA: Diagnosis not present

## 2020-03-02 DIAGNOSIS — M86261 Subacute osteomyelitis, right tibia and fibula: Secondary | ICD-10-CM | POA: Diagnosis not present

## 2020-03-02 DIAGNOSIS — R809 Proteinuria, unspecified: Secondary | ICD-10-CM | POA: Diagnosis not present

## 2020-03-02 DIAGNOSIS — D2271 Melanocytic nevi of right lower limb, including hip: Secondary | ICD-10-CM | POA: Diagnosis not present

## 2020-03-02 DIAGNOSIS — D2262 Melanocytic nevi of left upper limb, including shoulder: Secondary | ICD-10-CM | POA: Diagnosis not present

## 2020-03-02 DIAGNOSIS — L603 Nail dystrophy: Secondary | ICD-10-CM | POA: Diagnosis not present

## 2020-03-02 DIAGNOSIS — S42302D Unspecified fracture of shaft of humerus, left arm, subsequent encounter for fracture with routine healing: Secondary | ICD-10-CM | POA: Diagnosis not present

## 2020-03-02 DIAGNOSIS — Z833 Family history of diabetes mellitus: Secondary | ICD-10-CM | POA: Diagnosis not present

## 2020-03-02 DIAGNOSIS — M7122 Synovial cyst of popliteal space [Baker], left knee: Secondary | ICD-10-CM | POA: Diagnosis not present

## 2020-03-02 DIAGNOSIS — M7918 Myalgia, other site: Secondary | ICD-10-CM | POA: Diagnosis not present

## 2020-03-02 DIAGNOSIS — J00 Acute nasopharyngitis [common cold]: Secondary | ICD-10-CM | POA: Diagnosis not present

## 2020-03-02 DIAGNOSIS — H532 Diplopia: Secondary | ICD-10-CM | POA: Diagnosis not present

## 2020-03-02 DIAGNOSIS — Z961 Presence of intraocular lens: Secondary | ICD-10-CM | POA: Diagnosis not present

## 2020-03-02 DIAGNOSIS — M533 Sacrococcygeal disorders, not elsewhere classified: Secondary | ICD-10-CM | POA: Diagnosis not present

## 2020-03-02 DIAGNOSIS — X32XXXA Exposure to sunlight, initial encounter: Secondary | ICD-10-CM | POA: Diagnosis not present

## 2020-03-02 DIAGNOSIS — H60331 Swimmer's ear, right ear: Secondary | ICD-10-CM | POA: Diagnosis not present

## 2020-03-02 DIAGNOSIS — F418 Other specified anxiety disorders: Secondary | ICD-10-CM | POA: Diagnosis not present

## 2020-03-02 DIAGNOSIS — Z7189 Other specified counseling: Secondary | ICD-10-CM | POA: Diagnosis not present

## 2020-03-02 DIAGNOSIS — M5441 Lumbago with sciatica, right side: Secondary | ICD-10-CM | POA: Diagnosis not present

## 2020-03-02 DIAGNOSIS — L578 Other skin changes due to chronic exposure to nonionizing radiation: Secondary | ICD-10-CM | POA: Diagnosis not present

## 2020-03-02 DIAGNOSIS — G4489 Other headache syndrome: Secondary | ICD-10-CM | POA: Diagnosis not present

## 2020-03-02 DIAGNOSIS — Z951 Presence of aortocoronary bypass graft: Secondary | ICD-10-CM | POA: Diagnosis not present

## 2020-03-02 DIAGNOSIS — Z471 Aftercare following joint replacement surgery: Secondary | ICD-10-CM | POA: Diagnosis not present

## 2020-03-02 DIAGNOSIS — F419 Anxiety disorder, unspecified: Secondary | ICD-10-CM | POA: Diagnosis not present

## 2020-03-02 DIAGNOSIS — R110 Nausea: Secondary | ICD-10-CM | POA: Diagnosis not present

## 2020-03-02 DIAGNOSIS — E2839 Other primary ovarian failure: Secondary | ICD-10-CM | POA: Diagnosis not present

## 2020-03-02 DIAGNOSIS — Z1283 Encounter for screening for malignant neoplasm of skin: Secondary | ICD-10-CM | POA: Diagnosis not present

## 2020-03-02 DIAGNOSIS — H608X3 Other otitis externa, bilateral: Secondary | ICD-10-CM | POA: Diagnosis not present

## 2020-03-02 DIAGNOSIS — I130 Hypertensive heart and chronic kidney disease with heart failure and stage 1 through stage 4 chronic kidney disease, or unspecified chronic kidney disease: Secondary | ICD-10-CM | POA: Diagnosis not present

## 2020-03-02 DIAGNOSIS — I4430 Unspecified atrioventricular block: Secondary | ICD-10-CM | POA: Diagnosis not present

## 2020-03-02 DIAGNOSIS — L649 Androgenic alopecia, unspecified: Secondary | ICD-10-CM | POA: Diagnosis not present

## 2020-03-02 DIAGNOSIS — K625 Hemorrhage of anus and rectum: Secondary | ICD-10-CM | POA: Diagnosis not present

## 2020-03-02 DIAGNOSIS — C250 Malignant neoplasm of head of pancreas: Secondary | ICD-10-CM | POA: Diagnosis not present

## 2020-03-02 DIAGNOSIS — R946 Abnormal results of thyroid function studies: Secondary | ICD-10-CM | POA: Diagnosis not present

## 2020-03-02 DIAGNOSIS — Z8719 Personal history of other diseases of the digestive system: Secondary | ICD-10-CM | POA: Diagnosis not present

## 2020-03-02 DIAGNOSIS — H401421 Capsular glaucoma with pseudoexfoliation of lens, left eye, mild stage: Secondary | ICD-10-CM | POA: Diagnosis not present

## 2020-03-02 DIAGNOSIS — H5461 Unqualified visual loss, right eye, normal vision left eye: Secondary | ICD-10-CM | POA: Diagnosis not present

## 2020-03-02 DIAGNOSIS — T8789 Other complications of amputation stump: Secondary | ICD-10-CM | POA: Diagnosis not present

## 2020-03-02 DIAGNOSIS — M5413 Radiculopathy, cervicothoracic region: Secondary | ICD-10-CM | POA: Diagnosis not present

## 2020-03-02 DIAGNOSIS — R739 Hyperglycemia, unspecified: Secondary | ICD-10-CM | POA: Diagnosis not present

## 2020-03-02 DIAGNOSIS — R9431 Abnormal electrocardiogram [ECG] [EKG]: Secondary | ICD-10-CM | POA: Diagnosis not present

## 2020-03-02 DIAGNOSIS — I071 Rheumatic tricuspid insufficiency: Secondary | ICD-10-CM | POA: Diagnosis not present

## 2020-03-02 DIAGNOSIS — B3789 Other sites of candidiasis: Secondary | ICD-10-CM | POA: Diagnosis not present

## 2020-03-02 DIAGNOSIS — Q221 Congenital pulmonary valve stenosis: Secondary | ICD-10-CM | POA: Diagnosis not present

## 2020-03-02 DIAGNOSIS — G309 Alzheimer's disease, unspecified: Secondary | ICD-10-CM | POA: Diagnosis not present

## 2020-03-02 DIAGNOSIS — Z5320 Procedure and treatment not carried out because of patient's decision for unspecified reasons: Secondary | ICD-10-CM | POA: Diagnosis not present

## 2020-03-02 DIAGNOSIS — L738 Other specified follicular disorders: Secondary | ICD-10-CM | POA: Diagnosis not present

## 2020-03-02 DIAGNOSIS — Z136 Encounter for screening for cardiovascular disorders: Secondary | ICD-10-CM | POA: Diagnosis not present

## 2020-03-02 DIAGNOSIS — L570 Actinic keratosis: Secondary | ICD-10-CM | POA: Diagnosis not present

## 2020-03-02 DIAGNOSIS — Z85038 Personal history of other malignant neoplasm of large intestine: Secondary | ICD-10-CM | POA: Diagnosis not present

## 2020-03-02 DIAGNOSIS — S91109D Unspecified open wound of unspecified toe(s) without damage to nail, subsequent encounter: Secondary | ICD-10-CM | POA: Diagnosis not present

## 2020-03-02 DIAGNOSIS — Z853 Personal history of malignant neoplasm of breast: Secondary | ICD-10-CM | POA: Diagnosis not present

## 2020-03-02 DIAGNOSIS — D329 Benign neoplasm of meninges, unspecified: Secondary | ICD-10-CM | POA: Diagnosis not present

## 2020-03-02 DIAGNOSIS — E042 Nontoxic multinodular goiter: Secondary | ICD-10-CM | POA: Diagnosis not present

## 2020-03-02 DIAGNOSIS — H9190 Unspecified hearing loss, unspecified ear: Secondary | ICD-10-CM | POA: Diagnosis not present

## 2020-03-02 DIAGNOSIS — Z1389 Encounter for screening for other disorder: Secondary | ICD-10-CM | POA: Diagnosis not present

## 2020-03-02 DIAGNOSIS — R0789 Other chest pain: Secondary | ICD-10-CM | POA: Diagnosis not present

## 2020-03-02 DIAGNOSIS — R748 Abnormal levels of other serum enzymes: Secondary | ICD-10-CM | POA: Diagnosis not present

## 2020-03-02 DIAGNOSIS — M5030 Other cervical disc degeneration, unspecified cervical region: Secondary | ICD-10-CM | POA: Diagnosis not present

## 2020-03-02 DIAGNOSIS — S143XXD Injury of brachial plexus, subsequent encounter: Secondary | ICD-10-CM | POA: Diagnosis not present

## 2020-03-02 DIAGNOSIS — G5751 Tarsal tunnel syndrome, right lower limb: Secondary | ICD-10-CM | POA: Diagnosis not present

## 2020-03-02 DIAGNOSIS — I509 Heart failure, unspecified: Secondary | ICD-10-CM | POA: Diagnosis not present

## 2020-03-02 DIAGNOSIS — H401131 Primary open-angle glaucoma, bilateral, mild stage: Secondary | ICD-10-CM | POA: Diagnosis not present

## 2020-03-02 DIAGNOSIS — H04123 Dry eye syndrome of bilateral lacrimal glands: Secondary | ICD-10-CM | POA: Diagnosis not present

## 2020-03-02 DIAGNOSIS — G63 Polyneuropathy in diseases classified elsewhere: Secondary | ICD-10-CM | POA: Diagnosis not present

## 2020-03-02 DIAGNOSIS — S83282A Other tear of lateral meniscus, current injury, left knee, initial encounter: Secondary | ICD-10-CM | POA: Diagnosis not present

## 2020-03-02 DIAGNOSIS — S0181XD Laceration without foreign body of other part of head, subsequent encounter: Secondary | ICD-10-CM | POA: Diagnosis not present

## 2020-03-02 DIAGNOSIS — K529 Noninfective gastroenteritis and colitis, unspecified: Secondary | ICD-10-CM | POA: Diagnosis not present

## 2020-03-02 DIAGNOSIS — L910 Hypertrophic scar: Secondary | ICD-10-CM | POA: Diagnosis not present

## 2020-03-02 DIAGNOSIS — H32 Chorioretinal disorders in diseases classified elsewhere: Secondary | ICD-10-CM | POA: Diagnosis not present

## 2020-03-02 DIAGNOSIS — M19012 Primary osteoarthritis, left shoulder: Secondary | ICD-10-CM | POA: Diagnosis not present

## 2020-03-02 DIAGNOSIS — Z8616 Personal history of COVID-19: Secondary | ICD-10-CM | POA: Diagnosis not present

## 2020-03-02 DIAGNOSIS — D123 Benign neoplasm of transverse colon: Secondary | ICD-10-CM | POA: Diagnosis not present

## 2020-03-02 DIAGNOSIS — Z6830 Body mass index (BMI) 30.0-30.9, adult: Secondary | ICD-10-CM | POA: Diagnosis not present

## 2020-03-02 DIAGNOSIS — L853 Xerosis cutis: Secondary | ICD-10-CM | POA: Diagnosis not present

## 2020-03-02 DIAGNOSIS — K869 Disease of pancreas, unspecified: Secondary | ICD-10-CM | POA: Diagnosis not present

## 2020-03-02 DIAGNOSIS — G7100 Muscular dystrophy, unspecified: Secondary | ICD-10-CM | POA: Diagnosis not present

## 2020-03-02 DIAGNOSIS — M9901 Segmental and somatic dysfunction of cervical region: Secondary | ICD-10-CM | POA: Diagnosis not present

## 2020-03-02 DIAGNOSIS — I259 Chronic ischemic heart disease, unspecified: Secondary | ICD-10-CM | POA: Diagnosis not present

## 2020-03-02 DIAGNOSIS — K5909 Other constipation: Secondary | ICD-10-CM | POA: Diagnosis not present

## 2020-03-02 DIAGNOSIS — H18413 Arcus senilis, bilateral: Secondary | ICD-10-CM | POA: Diagnosis not present

## 2020-03-02 DIAGNOSIS — K8080 Other cholelithiasis without obstruction: Secondary | ICD-10-CM | POA: Diagnosis not present

## 2020-03-02 DIAGNOSIS — N952 Postmenopausal atrophic vaginitis: Secondary | ICD-10-CM | POA: Diagnosis not present

## 2020-03-02 DIAGNOSIS — G5602 Carpal tunnel syndrome, left upper limb: Secondary | ICD-10-CM | POA: Diagnosis not present

## 2020-03-02 DIAGNOSIS — S46001D Unspecified injury of muscle(s) and tendon(s) of the rotator cuff of right shoulder, subsequent encounter: Secondary | ICD-10-CM | POA: Diagnosis not present

## 2020-03-02 DIAGNOSIS — M4804 Spinal stenosis, thoracic region: Secondary | ICD-10-CM | POA: Diagnosis not present

## 2020-03-02 DIAGNOSIS — S91001A Unspecified open wound, right ankle, initial encounter: Secondary | ICD-10-CM | POA: Diagnosis not present

## 2020-03-02 DIAGNOSIS — D225 Melanocytic nevi of trunk: Secondary | ICD-10-CM | POA: Diagnosis not present

## 2020-03-02 DIAGNOSIS — L0201 Cutaneous abscess of face: Secondary | ICD-10-CM | POA: Diagnosis not present

## 2020-03-02 DIAGNOSIS — I4811 Longstanding persistent atrial fibrillation: Secondary | ICD-10-CM | POA: Diagnosis not present

## 2020-03-02 DIAGNOSIS — J9621 Acute and chronic respiratory failure with hypoxia: Secondary | ICD-10-CM | POA: Diagnosis not present

## 2020-03-02 DIAGNOSIS — Z993 Dependence on wheelchair: Secondary | ICD-10-CM | POA: Diagnosis not present

## 2020-03-02 DIAGNOSIS — M2041 Other hammer toe(s) (acquired), right foot: Secondary | ICD-10-CM | POA: Diagnosis not present

## 2020-03-02 DIAGNOSIS — R52 Pain, unspecified: Secondary | ICD-10-CM | POA: Diagnosis not present

## 2020-03-02 DIAGNOSIS — Z01810 Encounter for preprocedural cardiovascular examination: Secondary | ICD-10-CM | POA: Diagnosis not present

## 2020-03-02 DIAGNOSIS — H5213 Myopia, bilateral: Secondary | ICD-10-CM | POA: Diagnosis not present

## 2020-03-02 DIAGNOSIS — I110 Hypertensive heart disease with heart failure: Secondary | ICD-10-CM | POA: Diagnosis not present

## 2020-03-02 DIAGNOSIS — M13841 Other specified arthritis, right hand: Secondary | ICD-10-CM | POA: Diagnosis not present

## 2020-03-02 DIAGNOSIS — I639 Cerebral infarction, unspecified: Secondary | ICD-10-CM | POA: Diagnosis not present

## 2020-03-02 DIAGNOSIS — T63303A Toxic effect of unspecified spider venom, assault, initial encounter: Secondary | ICD-10-CM | POA: Diagnosis not present

## 2020-03-02 DIAGNOSIS — W19XXXD Unspecified fall, subsequent encounter: Secondary | ICD-10-CM | POA: Diagnosis not present

## 2020-03-02 DIAGNOSIS — I441 Atrioventricular block, second degree: Secondary | ICD-10-CM | POA: Diagnosis not present

## 2020-03-02 DIAGNOSIS — F319 Bipolar disorder, unspecified: Secondary | ICD-10-CM | POA: Diagnosis not present

## 2020-03-02 DIAGNOSIS — R509 Fever, unspecified: Secondary | ICD-10-CM | POA: Diagnosis not present

## 2020-03-02 DIAGNOSIS — J810 Acute pulmonary edema: Secondary | ICD-10-CM | POA: Diagnosis not present

## 2020-03-02 DIAGNOSIS — G252 Other specified forms of tremor: Secondary | ICD-10-CM | POA: Diagnosis not present

## 2020-03-02 DIAGNOSIS — Z510 Encounter for antineoplastic radiation therapy: Secondary | ICD-10-CM | POA: Diagnosis not present

## 2020-03-02 DIAGNOSIS — S32501D Unspecified fracture of right pubis, subsequent encounter for fracture with routine healing: Secondary | ICD-10-CM | POA: Diagnosis not present

## 2020-03-02 DIAGNOSIS — Z23 Encounter for immunization: Secondary | ICD-10-CM | POA: Diagnosis not present

## 2020-03-02 DIAGNOSIS — L97922 Non-pressure chronic ulcer of unspecified part of left lower leg with fat layer exposed: Secondary | ICD-10-CM | POA: Diagnosis not present

## 2020-03-02 DIAGNOSIS — M62561 Muscle wasting and atrophy, not elsewhere classified, right lower leg: Secondary | ICD-10-CM | POA: Diagnosis not present

## 2020-03-02 DIAGNOSIS — Z888 Allergy status to other drugs, medicaments and biological substances status: Secondary | ICD-10-CM | POA: Diagnosis not present

## 2020-03-02 DIAGNOSIS — C44629 Squamous cell carcinoma of skin of left upper limb, including shoulder: Secondary | ICD-10-CM | POA: Diagnosis not present

## 2020-03-02 DIAGNOSIS — R8290 Unspecified abnormal findings in urine: Secondary | ICD-10-CM | POA: Diagnosis not present

## 2020-03-02 DIAGNOSIS — M7731 Calcaneal spur, right foot: Secondary | ICD-10-CM | POA: Diagnosis not present

## 2020-03-02 DIAGNOSIS — Z5189 Encounter for other specified aftercare: Secondary | ICD-10-CM | POA: Diagnosis not present

## 2020-03-02 DIAGNOSIS — H02831 Dermatochalasis of right upper eyelid: Secondary | ICD-10-CM | POA: Diagnosis not present

## 2020-03-02 DIAGNOSIS — R4701 Aphasia: Secondary | ICD-10-CM | POA: Diagnosis not present

## 2020-03-02 DIAGNOSIS — Z66 Do not resuscitate: Secondary | ICD-10-CM | POA: Diagnosis not present

## 2020-03-02 DIAGNOSIS — L97512 Non-pressure chronic ulcer of other part of right foot with fat layer exposed: Secondary | ICD-10-CM | POA: Diagnosis not present

## 2020-03-02 DIAGNOSIS — E872 Acidosis: Secondary | ICD-10-CM | POA: Diagnosis not present

## 2020-03-02 DIAGNOSIS — K644 Residual hemorrhoidal skin tags: Secondary | ICD-10-CM | POA: Diagnosis not present

## 2020-03-02 DIAGNOSIS — M0579 Rheumatoid arthritis with rheumatoid factor of multiple sites without organ or systems involvement: Secondary | ICD-10-CM | POA: Diagnosis not present

## 2020-03-02 DIAGNOSIS — Z6837 Body mass index (BMI) 37.0-37.9, adult: Secondary | ICD-10-CM | POA: Diagnosis not present

## 2020-03-02 DIAGNOSIS — I8393 Asymptomatic varicose veins of bilateral lower extremities: Secondary | ICD-10-CM | POA: Diagnosis not present

## 2020-03-02 DIAGNOSIS — M542 Cervicalgia: Secondary | ICD-10-CM | POA: Diagnosis not present

## 2020-03-02 DIAGNOSIS — I50812 Chronic right heart failure: Secondary | ICD-10-CM | POA: Diagnosis not present

## 2020-03-02 DIAGNOSIS — K640 First degree hemorrhoids: Secondary | ICD-10-CM | POA: Diagnosis not present

## 2020-03-02 DIAGNOSIS — E6609 Other obesity due to excess calories: Secondary | ICD-10-CM | POA: Diagnosis not present

## 2020-03-02 DIAGNOSIS — G14 Postpolio syndrome: Secondary | ICD-10-CM | POA: Diagnosis not present

## 2020-03-02 DIAGNOSIS — N3941 Urge incontinence: Secondary | ICD-10-CM | POA: Diagnosis not present

## 2020-03-02 DIAGNOSIS — R0609 Other forms of dyspnea: Secondary | ICD-10-CM | POA: Diagnosis not present

## 2020-03-02 DIAGNOSIS — J4520 Mild intermittent asthma, uncomplicated: Secondary | ICD-10-CM | POA: Diagnosis not present

## 2020-03-02 DIAGNOSIS — R7989 Other specified abnormal findings of blood chemistry: Secondary | ICD-10-CM | POA: Diagnosis not present

## 2020-03-02 DIAGNOSIS — K449 Diaphragmatic hernia without obstruction or gangrene: Secondary | ICD-10-CM | POA: Diagnosis not present

## 2020-03-02 DIAGNOSIS — G9009 Other idiopathic peripheral autonomic neuropathy: Secondary | ICD-10-CM | POA: Diagnosis not present

## 2020-03-02 DIAGNOSIS — Z8249 Family history of ischemic heart disease and other diseases of the circulatory system: Secondary | ICD-10-CM | POA: Diagnosis not present

## 2020-03-02 DIAGNOSIS — H2511 Age-related nuclear cataract, right eye: Secondary | ICD-10-CM | POA: Diagnosis not present

## 2020-03-02 DIAGNOSIS — I213 ST elevation (STEMI) myocardial infarction of unspecified site: Secondary | ICD-10-CM | POA: Diagnosis not present

## 2020-03-02 DIAGNOSIS — M62838 Other muscle spasm: Secondary | ICD-10-CM | POA: Diagnosis not present

## 2020-03-02 DIAGNOSIS — M75121 Complete rotator cuff tear or rupture of right shoulder, not specified as traumatic: Secondary | ICD-10-CM | POA: Diagnosis not present

## 2020-03-02 DIAGNOSIS — D122 Benign neoplasm of ascending colon: Secondary | ICD-10-CM | POA: Diagnosis not present

## 2020-03-02 DIAGNOSIS — Z9989 Dependence on other enabling machines and devices: Secondary | ICD-10-CM | POA: Diagnosis not present

## 2020-03-02 DIAGNOSIS — M9905 Segmental and somatic dysfunction of pelvic region: Secondary | ICD-10-CM | POA: Diagnosis not present

## 2020-03-02 DIAGNOSIS — S8391XD Sprain of unspecified site of right knee, subsequent encounter: Secondary | ICD-10-CM | POA: Diagnosis not present

## 2020-03-02 DIAGNOSIS — H43813 Vitreous degeneration, bilateral: Secondary | ICD-10-CM | POA: Diagnosis not present

## 2020-03-02 DIAGNOSIS — S233XXA Sprain of ligaments of thoracic spine, initial encounter: Secondary | ICD-10-CM | POA: Diagnosis not present

## 2020-03-02 DIAGNOSIS — Z90710 Acquired absence of both cervix and uterus: Secondary | ICD-10-CM | POA: Diagnosis not present

## 2020-03-02 DIAGNOSIS — G4733 Obstructive sleep apnea (adult) (pediatric): Secondary | ICD-10-CM | POA: Diagnosis not present

## 2020-03-02 DIAGNOSIS — L850 Acquired ichthyosis: Secondary | ICD-10-CM | POA: Diagnosis not present

## 2020-03-02 DIAGNOSIS — Z8352 Family history of ear disorders: Secondary | ICD-10-CM | POA: Diagnosis not present

## 2020-03-02 DIAGNOSIS — S72001A Fracture of unspecified part of neck of right femur, initial encounter for closed fracture: Secondary | ICD-10-CM | POA: Diagnosis not present

## 2020-03-02 DIAGNOSIS — Z6834 Body mass index (BMI) 34.0-34.9, adult: Secondary | ICD-10-CM | POA: Diagnosis not present

## 2020-03-02 DIAGNOSIS — Z1211 Encounter for screening for malignant neoplasm of colon: Secondary | ICD-10-CM | POA: Diagnosis not present

## 2020-03-02 DIAGNOSIS — K648 Other hemorrhoids: Secondary | ICD-10-CM | POA: Diagnosis not present

## 2020-03-02 DIAGNOSIS — M7751 Other enthesopathy of right foot: Secondary | ICD-10-CM | POA: Diagnosis not present

## 2020-03-02 DIAGNOSIS — Z954 Presence of other heart-valve replacement: Secondary | ICD-10-CM | POA: Diagnosis not present

## 2020-03-02 DIAGNOSIS — D1801 Hemangioma of skin and subcutaneous tissue: Secondary | ICD-10-CM | POA: Diagnosis not present

## 2020-03-02 DIAGNOSIS — M4312 Spondylolisthesis, cervical region: Secondary | ICD-10-CM | POA: Diagnosis not present

## 2020-03-02 DIAGNOSIS — Z9119 Patient's noncompliance with other medical treatment and regimen: Secondary | ICD-10-CM | POA: Diagnosis not present

## 2020-03-02 DIAGNOSIS — I491 Atrial premature depolarization: Secondary | ICD-10-CM | POA: Diagnosis not present

## 2020-03-02 DIAGNOSIS — R3915 Urgency of urination: Secondary | ICD-10-CM | POA: Diagnosis not present

## 2020-03-02 DIAGNOSIS — L814 Other melanin hyperpigmentation: Secondary | ICD-10-CM | POA: Diagnosis not present

## 2020-03-02 DIAGNOSIS — M80032S Age-related osteoporosis with current pathological fracture, left forearm, sequela: Secondary | ICD-10-CM | POA: Diagnosis not present

## 2020-03-02 DIAGNOSIS — R0902 Hypoxemia: Secondary | ICD-10-CM | POA: Diagnosis not present

## 2020-03-02 DIAGNOSIS — S0285XA Fracture of orbit, unspecified, initial encounter for closed fracture: Secondary | ICD-10-CM | POA: Diagnosis not present

## 2020-03-02 DIAGNOSIS — R3914 Feeling of incomplete bladder emptying: Secondary | ICD-10-CM | POA: Diagnosis not present

## 2020-03-02 DIAGNOSIS — R3129 Other microscopic hematuria: Secondary | ICD-10-CM | POA: Diagnosis not present

## 2020-03-02 DIAGNOSIS — N6489 Other specified disorders of breast: Secondary | ICD-10-CM | POA: Diagnosis not present

## 2020-03-02 DIAGNOSIS — M1611 Unilateral primary osteoarthritis, right hip: Secondary | ICD-10-CM | POA: Diagnosis not present

## 2020-03-02 DIAGNOSIS — Z452 Encounter for adjustment and management of vascular access device: Secondary | ICD-10-CM | POA: Diagnosis not present

## 2020-03-02 DIAGNOSIS — E559 Vitamin D deficiency, unspecified: Secondary | ICD-10-CM | POA: Diagnosis not present

## 2020-03-02 DIAGNOSIS — D171 Benign lipomatous neoplasm of skin and subcutaneous tissue of trunk: Secondary | ICD-10-CM | POA: Diagnosis not present

## 2020-03-02 DIAGNOSIS — I6310 Cerebral infarction due to embolism of unspecified precerebral artery: Secondary | ICD-10-CM | POA: Diagnosis not present

## 2020-03-02 DIAGNOSIS — J479 Bronchiectasis, uncomplicated: Secondary | ICD-10-CM | POA: Diagnosis not present

## 2020-03-02 DIAGNOSIS — K2100 Gastro-esophageal reflux disease with esophagitis, without bleeding: Secondary | ICD-10-CM | POA: Diagnosis not present

## 2020-03-02 DIAGNOSIS — H43811 Vitreous degeneration, right eye: Secondary | ICD-10-CM | POA: Diagnosis not present

## 2020-03-02 DIAGNOSIS — M7542 Impingement syndrome of left shoulder: Secondary | ICD-10-CM | POA: Diagnosis not present

## 2020-03-02 DIAGNOSIS — D492 Neoplasm of unspecified behavior of bone, soft tissue, and skin: Secondary | ICD-10-CM | POA: Diagnosis not present

## 2020-03-02 DIAGNOSIS — I420 Dilated cardiomyopathy: Secondary | ICD-10-CM | POA: Diagnosis not present

## 2020-03-02 DIAGNOSIS — I255 Ischemic cardiomyopathy: Secondary | ICD-10-CM | POA: Diagnosis not present

## 2020-03-02 DIAGNOSIS — D0471 Carcinoma in situ of skin of right lower limb, including hip: Secondary | ICD-10-CM | POA: Diagnosis not present

## 2020-03-02 DIAGNOSIS — I359 Nonrheumatic aortic valve disorder, unspecified: Secondary | ICD-10-CM | POA: Diagnosis not present

## 2020-03-02 DIAGNOSIS — G5752 Tarsal tunnel syndrome, left lower limb: Secondary | ICD-10-CM | POA: Diagnosis not present

## 2020-03-02 DIAGNOSIS — H401134 Primary open-angle glaucoma, bilateral, indeterminate stage: Secondary | ICD-10-CM | POA: Diagnosis not present

## 2020-03-02 DIAGNOSIS — D0511 Intraductal carcinoma in situ of right breast: Secondary | ICD-10-CM | POA: Diagnosis not present

## 2020-03-02 DIAGNOSIS — M7662 Achilles tendinitis, left leg: Secondary | ICD-10-CM | POA: Diagnosis not present

## 2020-03-02 DIAGNOSIS — M66241 Spontaneous rupture of extensor tendons, right hand: Secondary | ICD-10-CM | POA: Diagnosis not present

## 2020-03-02 DIAGNOSIS — H34812 Central retinal vein occlusion, left eye, with macular edema: Secondary | ICD-10-CM | POA: Diagnosis not present

## 2020-03-02 DIAGNOSIS — D518 Other vitamin B12 deficiency anemias: Secondary | ICD-10-CM | POA: Diagnosis not present

## 2020-03-02 DIAGNOSIS — H35362 Drusen (degenerative) of macula, left eye: Secondary | ICD-10-CM | POA: Diagnosis not present

## 2020-03-02 DIAGNOSIS — Z87891 Personal history of nicotine dependence: Secondary | ICD-10-CM | POA: Diagnosis not present

## 2020-03-02 DIAGNOSIS — M238X2 Other internal derangements of left knee: Secondary | ICD-10-CM | POA: Diagnosis not present

## 2020-03-02 DIAGNOSIS — I38 Endocarditis, valve unspecified: Secondary | ICD-10-CM | POA: Diagnosis not present

## 2020-03-02 DIAGNOSIS — Z980 Intestinal bypass and anastomosis status: Secondary | ICD-10-CM | POA: Diagnosis not present

## 2020-03-02 DIAGNOSIS — R102 Pelvic and perineal pain: Secondary | ICD-10-CM | POA: Diagnosis not present

## 2020-03-02 DIAGNOSIS — H30893 Other chorioretinal inflammations, bilateral: Secondary | ICD-10-CM | POA: Diagnosis not present

## 2020-03-02 DIAGNOSIS — H02401 Unspecified ptosis of right eyelid: Secondary | ICD-10-CM | POA: Diagnosis not present

## 2020-03-02 DIAGNOSIS — C50112 Malignant neoplasm of central portion of left female breast: Secondary | ICD-10-CM | POA: Diagnosis not present

## 2020-03-02 DIAGNOSIS — E119 Type 2 diabetes mellitus without complications: Secondary | ICD-10-CM | POA: Diagnosis not present

## 2020-03-02 DIAGNOSIS — Z139 Encounter for screening, unspecified: Secondary | ICD-10-CM | POA: Diagnosis not present

## 2020-03-02 DIAGNOSIS — H35033 Hypertensive retinopathy, bilateral: Secondary | ICD-10-CM | POA: Diagnosis not present

## 2020-03-02 DIAGNOSIS — M1812 Unilateral primary osteoarthritis of first carpometacarpal joint, left hand: Secondary | ICD-10-CM | POA: Diagnosis not present

## 2020-03-02 DIAGNOSIS — K589 Irritable bowel syndrome without diarrhea: Secondary | ICD-10-CM | POA: Diagnosis not present

## 2020-03-02 DIAGNOSIS — B9689 Other specified bacterial agents as the cause of diseases classified elsewhere: Secondary | ICD-10-CM | POA: Diagnosis not present

## 2020-03-02 DIAGNOSIS — R222 Localized swelling, mass and lump, trunk: Secondary | ICD-10-CM | POA: Diagnosis not present

## 2020-03-02 DIAGNOSIS — D2239 Melanocytic nevi of other parts of face: Secondary | ICD-10-CM | POA: Diagnosis not present

## 2020-03-02 DIAGNOSIS — Z8551 Personal history of malignant neoplasm of bladder: Secondary | ICD-10-CM | POA: Diagnosis not present

## 2020-03-02 DIAGNOSIS — R188 Other ascites: Secondary | ICD-10-CM | POA: Diagnosis not present

## 2020-03-02 DIAGNOSIS — S78111D Complete traumatic amputation at level between right hip and knee, subsequent encounter: Secondary | ICD-10-CM | POA: Diagnosis not present

## 2020-03-02 DIAGNOSIS — C946 Myelodysplastic disease, not classified: Secondary | ICD-10-CM | POA: Diagnosis not present

## 2020-03-02 DIAGNOSIS — F5101 Primary insomnia: Secondary | ICD-10-CM | POA: Diagnosis not present

## 2020-03-02 DIAGNOSIS — T887XXA Unspecified adverse effect of drug or medicament, initial encounter: Secondary | ICD-10-CM | POA: Diagnosis not present

## 2020-03-02 DIAGNOSIS — K754 Autoimmune hepatitis: Secondary | ICD-10-CM | POA: Diagnosis not present

## 2020-03-02 DIAGNOSIS — N1330 Unspecified hydronephrosis: Secondary | ICD-10-CM | POA: Diagnosis not present

## 2020-03-02 DIAGNOSIS — M4724 Other spondylosis with radiculopathy, thoracic region: Secondary | ICD-10-CM | POA: Diagnosis not present

## 2020-03-02 DIAGNOSIS — I428 Other cardiomyopathies: Secondary | ICD-10-CM | POA: Diagnosis not present

## 2020-03-02 DIAGNOSIS — G4700 Insomnia, unspecified: Secondary | ICD-10-CM | POA: Diagnosis not present

## 2020-03-02 DIAGNOSIS — K4090 Unilateral inguinal hernia, without obstruction or gangrene, not specified as recurrent: Secondary | ICD-10-CM | POA: Diagnosis not present

## 2020-03-02 DIAGNOSIS — K222 Esophageal obstruction: Secondary | ICD-10-CM | POA: Diagnosis not present

## 2020-03-02 DIAGNOSIS — C801 Malignant (primary) neoplasm, unspecified: Secondary | ICD-10-CM | POA: Diagnosis not present

## 2020-03-02 DIAGNOSIS — L89154 Pressure ulcer of sacral region, stage 4: Secondary | ICD-10-CM | POA: Diagnosis not present

## 2020-03-02 DIAGNOSIS — M87059 Idiopathic aseptic necrosis of unspecified femur: Secondary | ICD-10-CM | POA: Diagnosis not present

## 2020-03-02 DIAGNOSIS — R1310 Dysphagia, unspecified: Secondary | ICD-10-CM | POA: Diagnosis not present

## 2020-03-02 DIAGNOSIS — F410 Panic disorder [episodic paroxysmal anxiety] without agoraphobia: Secondary | ICD-10-CM | POA: Diagnosis not present

## 2020-03-02 DIAGNOSIS — G301 Alzheimer's disease with late onset: Secondary | ICD-10-CM | POA: Diagnosis not present

## 2020-03-02 DIAGNOSIS — I120 Hypertensive chronic kidney disease with stage 5 chronic kidney disease or end stage renal disease: Secondary | ICD-10-CM | POA: Diagnosis not present

## 2020-03-02 DIAGNOSIS — T17900A Unspecified foreign body in respiratory tract, part unspecified causing asphyxiation, initial encounter: Secondary | ICD-10-CM | POA: Diagnosis not present

## 2020-03-02 DIAGNOSIS — Z09 Encounter for follow-up examination after completed treatment for conditions other than malignant neoplasm: Secondary | ICD-10-CM | POA: Diagnosis not present

## 2020-03-02 DIAGNOSIS — I517 Cardiomegaly: Secondary | ICD-10-CM | POA: Diagnosis not present

## 2020-03-02 DIAGNOSIS — L03031 Cellulitis of right toe: Secondary | ICD-10-CM | POA: Diagnosis not present

## 2020-03-02 DIAGNOSIS — N202 Calculus of kidney with calculus of ureter: Secondary | ICD-10-CM | POA: Diagnosis not present

## 2020-03-02 DIAGNOSIS — C670 Malignant neoplasm of trigone of bladder: Secondary | ICD-10-CM | POA: Diagnosis not present

## 2020-03-02 DIAGNOSIS — C3411 Malignant neoplasm of upper lobe, right bronchus or lung: Secondary | ICD-10-CM | POA: Diagnosis not present

## 2020-03-02 DIAGNOSIS — L918 Other hypertrophic disorders of the skin: Secondary | ICD-10-CM | POA: Diagnosis not present

## 2020-03-02 DIAGNOSIS — D869 Sarcoidosis, unspecified: Secondary | ICD-10-CM | POA: Diagnosis not present

## 2020-03-02 DIAGNOSIS — N814 Uterovaginal prolapse, unspecified: Secondary | ICD-10-CM | POA: Diagnosis not present

## 2020-03-02 DIAGNOSIS — B079 Viral wart, unspecified: Secondary | ICD-10-CM | POA: Diagnosis not present

## 2020-03-02 DIAGNOSIS — Z01419 Encounter for gynecological examination (general) (routine) without abnormal findings: Secondary | ICD-10-CM | POA: Diagnosis not present

## 2020-03-02 DIAGNOSIS — D509 Iron deficiency anemia, unspecified: Secondary | ICD-10-CM | POA: Diagnosis not present

## 2020-03-02 DIAGNOSIS — E781 Pure hyperglyceridemia: Secondary | ICD-10-CM | POA: Diagnosis not present

## 2020-03-02 DIAGNOSIS — I4892 Unspecified atrial flutter: Secondary | ICD-10-CM | POA: Diagnosis not present

## 2020-03-02 DIAGNOSIS — K8020 Calculus of gallbladder without cholecystitis without obstruction: Secondary | ICD-10-CM | POA: Diagnosis not present

## 2020-03-02 DIAGNOSIS — M5116 Intervertebral disc disorders with radiculopathy, lumbar region: Secondary | ICD-10-CM | POA: Diagnosis not present

## 2020-03-02 DIAGNOSIS — M13832 Other specified arthritis, left wrist: Secondary | ICD-10-CM | POA: Diagnosis not present

## 2020-03-02 DIAGNOSIS — F339 Major depressive disorder, recurrent, unspecified: Secondary | ICD-10-CM | POA: Diagnosis not present

## 2020-03-02 DIAGNOSIS — M25661 Stiffness of right knee, not elsewhere classified: Secondary | ICD-10-CM | POA: Diagnosis not present

## 2020-03-02 DIAGNOSIS — Z789 Other specified health status: Secondary | ICD-10-CM | POA: Diagnosis not present

## 2020-03-02 DIAGNOSIS — L409 Psoriasis, unspecified: Secondary | ICD-10-CM | POA: Diagnosis not present

## 2020-03-02 DIAGNOSIS — R293 Abnormal posture: Secondary | ICD-10-CM | POA: Diagnosis not present

## 2020-03-02 DIAGNOSIS — I471 Supraventricular tachycardia: Secondary | ICD-10-CM | POA: Diagnosis not present

## 2020-03-02 DIAGNOSIS — Z79899 Other long term (current) drug therapy: Secondary | ICD-10-CM | POA: Diagnosis not present

## 2020-03-02 DIAGNOSIS — M818 Other osteoporosis without current pathological fracture: Secondary | ICD-10-CM | POA: Diagnosis not present

## 2020-03-02 DIAGNOSIS — F309 Manic episode, unspecified: Secondary | ICD-10-CM | POA: Diagnosis not present

## 2020-03-02 DIAGNOSIS — Z7689 Persons encountering health services in other specified circumstances: Secondary | ICD-10-CM | POA: Diagnosis not present

## 2020-03-02 DIAGNOSIS — E785 Hyperlipidemia, unspecified: Secondary | ICD-10-CM | POA: Diagnosis not present

## 2020-03-02 DIAGNOSIS — J984 Other disorders of lung: Secondary | ICD-10-CM | POA: Diagnosis not present

## 2020-03-02 DIAGNOSIS — I5032 Chronic diastolic (congestive) heart failure: Secondary | ICD-10-CM | POA: Diagnosis not present

## 2020-03-02 DIAGNOSIS — K7469 Other cirrhosis of liver: Secondary | ICD-10-CM | POA: Diagnosis not present

## 2020-03-02 DIAGNOSIS — M4310 Spondylolisthesis, site unspecified: Secondary | ICD-10-CM | POA: Diagnosis not present

## 2020-03-02 DIAGNOSIS — M436 Torticollis: Secondary | ICD-10-CM | POA: Diagnosis not present

## 2020-03-02 DIAGNOSIS — E1042 Type 1 diabetes mellitus with diabetic polyneuropathy: Secondary | ICD-10-CM | POA: Diagnosis not present

## 2020-03-02 DIAGNOSIS — H40001 Preglaucoma, unspecified, right eye: Secondary | ICD-10-CM | POA: Diagnosis not present

## 2020-03-02 DIAGNOSIS — M7521 Bicipital tendinitis, right shoulder: Secondary | ICD-10-CM | POA: Diagnosis not present

## 2020-03-02 DIAGNOSIS — M7752 Other enthesopathy of left foot: Secondary | ICD-10-CM | POA: Diagnosis not present

## 2020-03-02 DIAGNOSIS — G20 Parkinson's disease: Secondary | ICD-10-CM | POA: Diagnosis not present

## 2020-03-02 DIAGNOSIS — K136 Irritative hyperplasia of oral mucosa: Secondary | ICD-10-CM | POA: Diagnosis not present

## 2020-03-02 DIAGNOSIS — F439 Reaction to severe stress, unspecified: Secondary | ICD-10-CM | POA: Diagnosis not present

## 2020-03-02 DIAGNOSIS — H539 Unspecified visual disturbance: Secondary | ICD-10-CM | POA: Diagnosis not present

## 2020-03-02 DIAGNOSIS — H10213 Acute toxic conjunctivitis, bilateral: Secondary | ICD-10-CM | POA: Diagnosis not present

## 2020-03-02 DIAGNOSIS — R6889 Other general symptoms and signs: Secondary | ICD-10-CM | POA: Diagnosis not present

## 2020-03-02 DIAGNOSIS — H353131 Nonexudative age-related macular degeneration, bilateral, early dry stage: Secondary | ICD-10-CM | POA: Diagnosis not present

## 2020-03-02 DIAGNOSIS — M4802 Spinal stenosis, cervical region: Secondary | ICD-10-CM | POA: Diagnosis not present

## 2020-03-02 DIAGNOSIS — M7522 Bicipital tendinitis, left shoulder: Secondary | ICD-10-CM | POA: Diagnosis not present

## 2020-03-02 DIAGNOSIS — Z96652 Presence of left artificial knee joint: Secondary | ICD-10-CM | POA: Diagnosis not present

## 2020-03-02 DIAGNOSIS — H401412 Capsular glaucoma with pseudoexfoliation of lens, right eye, moderate stage: Secondary | ICD-10-CM | POA: Diagnosis not present

## 2020-03-02 DIAGNOSIS — S6992XA Unspecified injury of left wrist, hand and finger(s), initial encounter: Secondary | ICD-10-CM | POA: Diagnosis not present

## 2020-03-02 DIAGNOSIS — N6310 Unspecified lump in the right breast, unspecified quadrant: Secondary | ICD-10-CM | POA: Diagnosis not present

## 2020-03-02 DIAGNOSIS — F4311 Post-traumatic stress disorder, acute: Secondary | ICD-10-CM | POA: Diagnosis not present

## 2020-03-02 DIAGNOSIS — I69828 Other speech and language deficits following other cerebrovascular disease: Secondary | ICD-10-CM | POA: Diagnosis not present

## 2020-03-02 DIAGNOSIS — E8589 Other amyloidosis: Secondary | ICD-10-CM | POA: Diagnosis not present

## 2020-03-02 DIAGNOSIS — J90 Pleural effusion, not elsewhere classified: Secondary | ICD-10-CM | POA: Diagnosis not present

## 2020-03-02 DIAGNOSIS — R1013 Epigastric pain: Secondary | ICD-10-CM | POA: Diagnosis not present

## 2020-03-02 DIAGNOSIS — Z7984 Long term (current) use of oral hypoglycemic drugs: Secondary | ICD-10-CM | POA: Diagnosis not present

## 2020-03-02 DIAGNOSIS — Z6821 Body mass index (BMI) 21.0-21.9, adult: Secondary | ICD-10-CM | POA: Diagnosis not present

## 2020-03-02 DIAGNOSIS — L209 Atopic dermatitis, unspecified: Secondary | ICD-10-CM | POA: Diagnosis not present

## 2020-03-02 DIAGNOSIS — D3709 Neoplasm of uncertain behavior of other specified sites of the oral cavity: Secondary | ICD-10-CM | POA: Diagnosis not present

## 2020-03-02 DIAGNOSIS — N184 Chronic kidney disease, stage 4 (severe): Secondary | ICD-10-CM | POA: Diagnosis not present

## 2020-03-02 DIAGNOSIS — R079 Chest pain, unspecified: Secondary | ICD-10-CM | POA: Diagnosis not present

## 2020-03-02 DIAGNOSIS — I63412 Cerebral infarction due to embolism of left middle cerebral artery: Secondary | ICD-10-CM | POA: Diagnosis not present

## 2020-03-02 DIAGNOSIS — R279 Unspecified lack of coordination: Secondary | ICD-10-CM | POA: Diagnosis not present

## 2020-03-02 DIAGNOSIS — S50312A Abrasion of left elbow, initial encounter: Secondary | ICD-10-CM | POA: Diagnosis not present

## 2020-03-02 DIAGNOSIS — H4901 Third [oculomotor] nerve palsy, right eye: Secondary | ICD-10-CM | POA: Diagnosis not present

## 2020-03-02 DIAGNOSIS — K635 Polyp of colon: Secondary | ICD-10-CM | POA: Diagnosis not present

## 2020-03-02 DIAGNOSIS — Q211 Atrial septal defect: Secondary | ICD-10-CM | POA: Diagnosis not present

## 2020-03-02 DIAGNOSIS — G9389 Other specified disorders of brain: Secondary | ICD-10-CM | POA: Diagnosis not present

## 2020-03-02 DIAGNOSIS — Z9049 Acquired absence of other specified parts of digestive tract: Secondary | ICD-10-CM | POA: Diagnosis not present

## 2020-03-02 DIAGNOSIS — I4819 Other persistent atrial fibrillation: Secondary | ICD-10-CM | POA: Diagnosis not present

## 2020-03-02 DIAGNOSIS — D473 Essential (hemorrhagic) thrombocythemia: Secondary | ICD-10-CM | POA: Diagnosis not present

## 2020-03-02 DIAGNOSIS — I739 Peripheral vascular disease, unspecified: Secondary | ICD-10-CM | POA: Diagnosis not present

## 2020-03-02 DIAGNOSIS — N3081 Other cystitis with hematuria: Secondary | ICD-10-CM | POA: Diagnosis not present

## 2020-03-02 DIAGNOSIS — S61451S Open bite of right hand, sequela: Secondary | ICD-10-CM | POA: Diagnosis not present

## 2020-03-02 DIAGNOSIS — I890 Lymphedema, not elsewhere classified: Secondary | ICD-10-CM | POA: Diagnosis not present

## 2020-03-02 DIAGNOSIS — I358 Other nonrheumatic aortic valve disorders: Secondary | ICD-10-CM | POA: Diagnosis not present

## 2020-03-02 DIAGNOSIS — B009 Herpesviral infection, unspecified: Secondary | ICD-10-CM | POA: Diagnosis not present

## 2020-03-02 DIAGNOSIS — M25572 Pain in left ankle and joints of left foot: Secondary | ICD-10-CM | POA: Diagnosis not present

## 2020-03-02 DIAGNOSIS — I2583 Coronary atherosclerosis due to lipid rich plaque: Secondary | ICD-10-CM | POA: Diagnosis not present

## 2020-03-02 DIAGNOSIS — K580 Irritable bowel syndrome with diarrhea: Secondary | ICD-10-CM | POA: Diagnosis not present

## 2020-03-02 DIAGNOSIS — D582 Other hemoglobinopathies: Secondary | ICD-10-CM | POA: Diagnosis not present

## 2020-03-02 DIAGNOSIS — S7291XA Unspecified fracture of right femur, initial encounter for closed fracture: Secondary | ICD-10-CM | POA: Diagnosis not present

## 2020-03-02 DIAGNOSIS — N130 Hydronephrosis with ureteropelvic junction obstruction: Secondary | ICD-10-CM | POA: Diagnosis not present

## 2020-03-02 DIAGNOSIS — E11621 Type 2 diabetes mellitus with foot ulcer: Secondary | ICD-10-CM | POA: Diagnosis not present

## 2020-03-02 DIAGNOSIS — H5201 Hypermetropia, right eye: Secondary | ICD-10-CM | POA: Diagnosis not present

## 2020-03-02 DIAGNOSIS — K3189 Other diseases of stomach and duodenum: Secondary | ICD-10-CM | POA: Diagnosis not present

## 2020-03-02 DIAGNOSIS — H4032X3 Glaucoma secondary to eye trauma, left eye, severe stage: Secondary | ICD-10-CM | POA: Diagnosis not present

## 2020-03-02 DIAGNOSIS — R7301 Impaired fasting glucose: Secondary | ICD-10-CM | POA: Diagnosis not present

## 2020-03-02 DIAGNOSIS — I4821 Permanent atrial fibrillation: Secondary | ICD-10-CM | POA: Diagnosis not present

## 2020-03-02 DIAGNOSIS — N132 Hydronephrosis with renal and ureteral calculous obstruction: Secondary | ICD-10-CM | POA: Diagnosis not present

## 2020-03-02 DIAGNOSIS — I6782 Cerebral ischemia: Secondary | ICD-10-CM | POA: Diagnosis not present

## 2020-03-02 DIAGNOSIS — M19011 Primary osteoarthritis, right shoulder: Secondary | ICD-10-CM | POA: Diagnosis not present

## 2020-03-02 DIAGNOSIS — S86002D Unspecified injury of left Achilles tendon, subsequent encounter: Secondary | ICD-10-CM | POA: Diagnosis not present

## 2020-03-02 DIAGNOSIS — M5431 Sciatica, right side: Secondary | ICD-10-CM | POA: Diagnosis not present

## 2020-03-02 DIAGNOSIS — Z8349 Family history of other endocrine, nutritional and metabolic diseases: Secondary | ICD-10-CM | POA: Diagnosis not present

## 2020-03-02 DIAGNOSIS — M2011 Hallux valgus (acquired), right foot: Secondary | ICD-10-CM | POA: Diagnosis not present

## 2020-03-02 DIAGNOSIS — G40909 Epilepsy, unspecified, not intractable, without status epilepticus: Secondary | ICD-10-CM | POA: Diagnosis not present

## 2020-03-02 DIAGNOSIS — J45909 Unspecified asthma, uncomplicated: Secondary | ICD-10-CM | POA: Diagnosis not present

## 2020-03-02 DIAGNOSIS — I25119 Atherosclerotic heart disease of native coronary artery with unspecified angina pectoris: Secondary | ICD-10-CM | POA: Diagnosis not present

## 2020-03-02 DIAGNOSIS — M4126 Other idiopathic scoliosis, lumbar region: Secondary | ICD-10-CM | POA: Diagnosis not present

## 2020-03-02 DIAGNOSIS — Z20828 Contact with and (suspected) exposure to other viral communicable diseases: Secondary | ICD-10-CM | POA: Diagnosis not present

## 2020-03-02 DIAGNOSIS — H353132 Nonexudative age-related macular degeneration, bilateral, intermediate dry stage: Secondary | ICD-10-CM | POA: Diagnosis not present

## 2020-03-02 DIAGNOSIS — H353113 Nonexudative age-related macular degeneration, right eye, advanced atrophic without subfoveal involvement: Secondary | ICD-10-CM | POA: Diagnosis not present

## 2020-03-02 DIAGNOSIS — J310 Chronic rhinitis: Secondary | ICD-10-CM | POA: Diagnosis not present

## 2020-03-02 DIAGNOSIS — R351 Nocturia: Secondary | ICD-10-CM | POA: Diagnosis not present

## 2020-03-02 DIAGNOSIS — D414 Neoplasm of uncertain behavior of bladder: Secondary | ICD-10-CM | POA: Diagnosis not present

## 2020-03-02 DIAGNOSIS — H35351 Cystoid macular degeneration, right eye: Secondary | ICD-10-CM | POA: Diagnosis not present

## 2020-03-02 DIAGNOSIS — F4321 Adjustment disorder with depressed mood: Secondary | ICD-10-CM | POA: Diagnosis not present

## 2020-03-02 DIAGNOSIS — I341 Nonrheumatic mitral (valve) prolapse: Secondary | ICD-10-CM | POA: Diagnosis not present

## 2020-03-02 DIAGNOSIS — G5603 Carpal tunnel syndrome, bilateral upper limbs: Secondary | ICD-10-CM | POA: Diagnosis not present

## 2020-03-02 DIAGNOSIS — H2102 Hyphema, left eye: Secondary | ICD-10-CM | POA: Diagnosis not present

## 2020-03-02 DIAGNOSIS — J4550 Severe persistent asthma, uncomplicated: Secondary | ICD-10-CM | POA: Diagnosis not present

## 2020-03-02 DIAGNOSIS — F325 Major depressive disorder, single episode, in full remission: Secondary | ICD-10-CM | POA: Diagnosis not present

## 2020-03-02 DIAGNOSIS — H52203 Unspecified astigmatism, bilateral: Secondary | ICD-10-CM | POA: Diagnosis not present

## 2020-03-02 DIAGNOSIS — D1622 Benign neoplasm of long bones of left lower limb: Secondary | ICD-10-CM | POA: Diagnosis not present

## 2020-03-02 DIAGNOSIS — K760 Fatty (change of) liver, not elsewhere classified: Secondary | ICD-10-CM | POA: Diagnosis not present

## 2020-03-02 DIAGNOSIS — C3431 Malignant neoplasm of lower lobe, right bronchus or lung: Secondary | ICD-10-CM | POA: Diagnosis not present

## 2020-03-02 DIAGNOSIS — I5082 Biventricular heart failure: Secondary | ICD-10-CM | POA: Diagnosis not present

## 2020-03-02 DIAGNOSIS — A448 Other forms of bartonellosis: Secondary | ICD-10-CM | POA: Diagnosis not present

## 2020-03-02 DIAGNOSIS — A419 Sepsis, unspecified organism: Secondary | ICD-10-CM | POA: Diagnosis not present

## 2020-03-02 DIAGNOSIS — H52223 Regular astigmatism, bilateral: Secondary | ICD-10-CM | POA: Diagnosis not present

## 2020-03-02 DIAGNOSIS — I361 Nonrheumatic tricuspid (valve) insufficiency: Secondary | ICD-10-CM | POA: Diagnosis not present

## 2020-03-02 DIAGNOSIS — C569 Malignant neoplasm of unspecified ovary: Secondary | ICD-10-CM | POA: Diagnosis not present

## 2020-03-02 DIAGNOSIS — Z803 Family history of malignant neoplasm of breast: Secondary | ICD-10-CM | POA: Diagnosis not present

## 2020-03-02 DIAGNOSIS — B070 Plantar wart: Secondary | ICD-10-CM | POA: Diagnosis not present

## 2020-03-02 DIAGNOSIS — N1831 Chronic kidney disease, stage 3a: Secondary | ICD-10-CM | POA: Diagnosis not present

## 2020-03-02 DIAGNOSIS — Q796 Ehlers-Danlos syndrome, unspecified: Secondary | ICD-10-CM | POA: Diagnosis not present

## 2020-03-02 DIAGNOSIS — M4722 Other spondylosis with radiculopathy, cervical region: Secondary | ICD-10-CM | POA: Diagnosis not present

## 2020-03-02 DIAGNOSIS — M4800 Spinal stenosis, site unspecified: Secondary | ICD-10-CM | POA: Diagnosis not present

## 2020-03-02 DIAGNOSIS — T50905A Adverse effect of unspecified drugs, medicaments and biological substances, initial encounter: Secondary | ICD-10-CM | POA: Diagnosis not present

## 2020-03-02 DIAGNOSIS — D72829 Elevated white blood cell count, unspecified: Secondary | ICD-10-CM | POA: Diagnosis not present

## 2020-03-02 DIAGNOSIS — Z03818 Encounter for observation for suspected exposure to other biological agents ruled out: Secondary | ICD-10-CM | POA: Diagnosis not present

## 2020-03-02 DIAGNOSIS — R072 Precordial pain: Secondary | ICD-10-CM | POA: Diagnosis not present

## 2020-03-02 DIAGNOSIS — N189 Chronic kidney disease, unspecified: Secondary | ICD-10-CM | POA: Diagnosis not present

## 2020-03-02 DIAGNOSIS — Z981 Arthrodesis status: Secondary | ICD-10-CM | POA: Diagnosis not present

## 2020-03-02 DIAGNOSIS — I63233 Cerebral infarction due to unspecified occlusion or stenosis of bilateral carotid arteries: Secondary | ICD-10-CM | POA: Diagnosis not present

## 2020-03-02 DIAGNOSIS — M5134 Other intervertebral disc degeneration, thoracic region: Secondary | ICD-10-CM | POA: Diagnosis not present

## 2020-03-02 DIAGNOSIS — H3530 Unspecified macular degeneration: Secondary | ICD-10-CM | POA: Diagnosis not present

## 2020-03-02 DIAGNOSIS — E038 Other specified hypothyroidism: Secondary | ICD-10-CM | POA: Diagnosis not present

## 2020-03-02 DIAGNOSIS — E1140 Type 2 diabetes mellitus with diabetic neuropathy, unspecified: Secondary | ICD-10-CM | POA: Diagnosis not present

## 2020-03-02 DIAGNOSIS — H35371 Puckering of macula, right eye: Secondary | ICD-10-CM | POA: Diagnosis not present

## 2020-03-02 DIAGNOSIS — Z5181 Encounter for therapeutic drug level monitoring: Secondary | ICD-10-CM | POA: Diagnosis not present

## 2020-03-02 DIAGNOSIS — M5136 Other intervertebral disc degeneration, lumbar region: Secondary | ICD-10-CM | POA: Diagnosis not present

## 2020-03-02 DIAGNOSIS — K769 Liver disease, unspecified: Secondary | ICD-10-CM | POA: Diagnosis not present

## 2020-03-02 DIAGNOSIS — D0512 Intraductal carcinoma in situ of left breast: Secondary | ICD-10-CM | POA: Diagnosis not present

## 2020-03-02 DIAGNOSIS — I69320 Aphasia following cerebral infarction: Secondary | ICD-10-CM | POA: Diagnosis not present

## 2020-03-02 DIAGNOSIS — G3184 Mild cognitive impairment, so stated: Secondary | ICD-10-CM | POA: Diagnosis not present

## 2020-03-02 DIAGNOSIS — I5021 Acute systolic (congestive) heart failure: Secondary | ICD-10-CM | POA: Diagnosis not present

## 2020-03-02 DIAGNOSIS — J3081 Allergic rhinitis due to animal (cat) (dog) hair and dander: Secondary | ICD-10-CM | POA: Diagnosis not present

## 2020-03-02 DIAGNOSIS — T451X5A Adverse effect of antineoplastic and immunosuppressive drugs, initial encounter: Secondary | ICD-10-CM | POA: Diagnosis not present

## 2020-03-02 DIAGNOSIS — G546 Phantom limb syndrome with pain: Secondary | ICD-10-CM | POA: Diagnosis not present

## 2020-03-02 DIAGNOSIS — C7931 Secondary malignant neoplasm of brain: Secondary | ICD-10-CM | POA: Diagnosis not present

## 2020-03-02 DIAGNOSIS — L03115 Cellulitis of right lower limb: Secondary | ICD-10-CM | POA: Diagnosis not present

## 2020-03-02 DIAGNOSIS — M50322 Other cervical disc degeneration at C5-C6 level: Secondary | ICD-10-CM | POA: Diagnosis not present

## 2020-03-02 DIAGNOSIS — S32592S Other specified fracture of left pubis, sequela: Secondary | ICD-10-CM | POA: Diagnosis not present

## 2020-03-02 DIAGNOSIS — M7989 Other specified soft tissue disorders: Secondary | ICD-10-CM | POA: Diagnosis not present

## 2020-03-02 DIAGNOSIS — Z5111 Encounter for antineoplastic chemotherapy: Secondary | ICD-10-CM | POA: Diagnosis not present

## 2020-03-02 DIAGNOSIS — S43422A Sprain of left rotator cuff capsule, initial encounter: Secondary | ICD-10-CM | POA: Diagnosis not present

## 2020-03-02 DIAGNOSIS — K432 Incisional hernia without obstruction or gangrene: Secondary | ICD-10-CM | POA: Diagnosis not present

## 2020-03-02 DIAGNOSIS — K5904 Chronic idiopathic constipation: Secondary | ICD-10-CM | POA: Diagnosis not present

## 2020-03-02 DIAGNOSIS — R338 Other retention of urine: Secondary | ICD-10-CM | POA: Diagnosis not present

## 2020-03-02 DIAGNOSIS — K7460 Unspecified cirrhosis of liver: Secondary | ICD-10-CM | POA: Diagnosis not present

## 2020-03-02 DIAGNOSIS — G4730 Sleep apnea, unspecified: Secondary | ICD-10-CM | POA: Diagnosis not present

## 2020-03-02 DIAGNOSIS — Z22322 Carrier or suspected carrier of Methicillin resistant Staphylococcus aureus: Secondary | ICD-10-CM | POA: Diagnosis not present

## 2020-03-02 DIAGNOSIS — F3289 Other specified depressive episodes: Secondary | ICD-10-CM | POA: Diagnosis not present

## 2020-03-02 DIAGNOSIS — D039 Melanoma in situ, unspecified: Secondary | ICD-10-CM | POA: Diagnosis not present

## 2020-03-02 DIAGNOSIS — M47817 Spondylosis without myelopathy or radiculopathy, lumbosacral region: Secondary | ICD-10-CM | POA: Diagnosis not present

## 2020-03-02 DIAGNOSIS — E1121 Type 2 diabetes mellitus with diabetic nephropathy: Secondary | ICD-10-CM | POA: Diagnosis not present

## 2020-03-02 DIAGNOSIS — M4317 Spondylolisthesis, lumbosacral region: Secondary | ICD-10-CM | POA: Diagnosis not present

## 2020-03-02 DIAGNOSIS — M19049 Primary osteoarthritis, unspecified hand: Secondary | ICD-10-CM | POA: Diagnosis not present

## 2020-03-02 DIAGNOSIS — R339 Retention of urine, unspecified: Secondary | ICD-10-CM | POA: Diagnosis not present

## 2020-03-02 DIAGNOSIS — L608 Other nail disorders: Secondary | ICD-10-CM | POA: Diagnosis not present

## 2020-03-02 DIAGNOSIS — H409 Unspecified glaucoma: Secondary | ICD-10-CM | POA: Diagnosis not present

## 2020-03-02 DIAGNOSIS — Z96653 Presence of artificial knee joint, bilateral: Secondary | ICD-10-CM | POA: Diagnosis not present

## 2020-03-02 DIAGNOSIS — M9902 Segmental and somatic dysfunction of thoracic region: Secondary | ICD-10-CM | POA: Diagnosis not present

## 2020-03-02 DIAGNOSIS — M79601 Pain in right arm: Secondary | ICD-10-CM | POA: Diagnosis not present

## 2020-03-02 DIAGNOSIS — M1711 Unilateral primary osteoarthritis, right knee: Secondary | ICD-10-CM | POA: Diagnosis not present

## 2020-03-02 DIAGNOSIS — G9340 Encephalopathy, unspecified: Secondary | ICD-10-CM | POA: Diagnosis not present

## 2020-03-02 DIAGNOSIS — L2389 Allergic contact dermatitis due to other agents: Secondary | ICD-10-CM | POA: Diagnosis not present

## 2020-03-02 DIAGNOSIS — S0522XA Ocular laceration and rupture with prolapse or loss of intraocular tissue, left eye, initial encounter: Secondary | ICD-10-CM | POA: Diagnosis not present

## 2020-03-02 DIAGNOSIS — R6884 Jaw pain: Secondary | ICD-10-CM | POA: Diagnosis not present

## 2020-03-02 DIAGNOSIS — M5442 Lumbago with sciatica, left side: Secondary | ICD-10-CM | POA: Diagnosis not present

## 2020-03-02 DIAGNOSIS — Z4589 Encounter for adjustment and management of other implanted devices: Secondary | ICD-10-CM | POA: Diagnosis not present

## 2020-03-02 DIAGNOSIS — R1032 Left lower quadrant pain: Secondary | ICD-10-CM | POA: Diagnosis not present

## 2020-03-02 DIAGNOSIS — F17219 Nicotine dependence, cigarettes, with unspecified nicotine-induced disorders: Secondary | ICD-10-CM | POA: Diagnosis not present

## 2020-03-02 DIAGNOSIS — J3089 Other allergic rhinitis: Secondary | ICD-10-CM | POA: Diagnosis not present

## 2020-03-02 DIAGNOSIS — Z89412 Acquired absence of left great toe: Secondary | ICD-10-CM | POA: Diagnosis not present

## 2020-03-02 DIAGNOSIS — L661 Lichen planopilaris: Secondary | ICD-10-CM | POA: Diagnosis not present

## 2020-03-02 DIAGNOSIS — E1165 Type 2 diabetes mellitus with hyperglycemia: Secondary | ICD-10-CM | POA: Diagnosis not present

## 2020-03-02 DIAGNOSIS — E538 Deficiency of other specified B group vitamins: Secondary | ICD-10-CM | POA: Diagnosis not present

## 2020-03-02 DIAGNOSIS — T148XXA Other injury of unspecified body region, initial encounter: Secondary | ICD-10-CM | POA: Diagnosis not present

## 2020-03-02 DIAGNOSIS — C44729 Squamous cell carcinoma of skin of left lower limb, including hip: Secondary | ICD-10-CM | POA: Diagnosis not present

## 2020-03-02 DIAGNOSIS — C187 Malignant neoplasm of sigmoid colon: Secondary | ICD-10-CM | POA: Diagnosis not present

## 2020-03-02 DIAGNOSIS — J9600 Acute respiratory failure, unspecified whether with hypoxia or hypercapnia: Secondary | ICD-10-CM | POA: Diagnosis not present

## 2020-03-02 DIAGNOSIS — C679 Malignant neoplasm of bladder, unspecified: Secondary | ICD-10-CM | POA: Diagnosis not present

## 2020-03-02 DIAGNOSIS — E7801 Familial hypercholesterolemia: Secondary | ICD-10-CM | POA: Diagnosis not present

## 2020-03-02 DIAGNOSIS — H4312 Vitreous hemorrhage, left eye: Secondary | ICD-10-CM | POA: Diagnosis not present

## 2020-03-02 DIAGNOSIS — M25662 Stiffness of left knee, not elsewhere classified: Secondary | ICD-10-CM | POA: Diagnosis not present

## 2020-03-02 DIAGNOSIS — E1151 Type 2 diabetes mellitus with diabetic peripheral angiopathy without gangrene: Secondary | ICD-10-CM | POA: Diagnosis not present

## 2020-03-02 DIAGNOSIS — Z9981 Dependence on supplemental oxygen: Secondary | ICD-10-CM | POA: Diagnosis not present

## 2020-03-02 DIAGNOSIS — C9000 Multiple myeloma not having achieved remission: Secondary | ICD-10-CM | POA: Diagnosis not present

## 2020-03-02 DIAGNOSIS — E871 Hypo-osmolality and hyponatremia: Secondary | ICD-10-CM | POA: Diagnosis not present

## 2020-03-02 DIAGNOSIS — H353133 Nonexudative age-related macular degeneration, bilateral, advanced atrophic without subfoveal involvement: Secondary | ICD-10-CM | POA: Diagnosis not present

## 2020-03-02 DIAGNOSIS — B360 Pityriasis versicolor: Secondary | ICD-10-CM | POA: Diagnosis not present

## 2020-03-02 DIAGNOSIS — Z808 Family history of malignant neoplasm of other organs or systems: Secondary | ICD-10-CM | POA: Diagnosis not present

## 2020-03-02 DIAGNOSIS — Z79891 Long term (current) use of opiate analgesic: Secondary | ICD-10-CM | POA: Diagnosis not present

## 2020-03-02 DIAGNOSIS — L851 Acquired keratosis [keratoderma] palmaris et plantaris: Secondary | ICD-10-CM | POA: Diagnosis not present

## 2020-03-02 DIAGNOSIS — H524 Presbyopia: Secondary | ICD-10-CM | POA: Diagnosis not present

## 2020-03-02 DIAGNOSIS — S91301A Unspecified open wound, right foot, initial encounter: Secondary | ICD-10-CM | POA: Diagnosis not present

## 2020-03-02 DIAGNOSIS — S46012D Strain of muscle(s) and tendon(s) of the rotator cuff of left shoulder, subsequent encounter: Secondary | ICD-10-CM | POA: Diagnosis not present

## 2020-03-02 DIAGNOSIS — M5416 Radiculopathy, lumbar region: Secondary | ICD-10-CM | POA: Diagnosis not present

## 2020-03-02 DIAGNOSIS — J45990 Exercise induced bronchospasm: Secondary | ICD-10-CM | POA: Diagnosis not present

## 2020-03-02 DIAGNOSIS — G90523 Complex regional pain syndrome I of lower limb, bilateral: Secondary | ICD-10-CM | POA: Diagnosis not present

## 2020-03-02 DIAGNOSIS — M79644 Pain in right finger(s): Secondary | ICD-10-CM | POA: Diagnosis not present

## 2020-03-02 DIAGNOSIS — D500 Iron deficiency anemia secondary to blood loss (chronic): Secondary | ICD-10-CM | POA: Diagnosis not present

## 2020-03-02 DIAGNOSIS — M25641 Stiffness of right hand, not elsewhere classified: Secondary | ICD-10-CM | POA: Diagnosis not present

## 2020-03-02 DIAGNOSIS — I6521 Occlusion and stenosis of right carotid artery: Secondary | ICD-10-CM | POA: Diagnosis not present

## 2020-03-02 DIAGNOSIS — M2042 Other hammer toe(s) (acquired), left foot: Secondary | ICD-10-CM | POA: Diagnosis not present

## 2020-03-02 DIAGNOSIS — S3992XA Unspecified injury of lower back, initial encounter: Secondary | ICD-10-CM | POA: Diagnosis not present

## 2020-03-02 DIAGNOSIS — F1010 Alcohol abuse, uncomplicated: Secondary | ICD-10-CM | POA: Diagnosis not present

## 2020-03-02 DIAGNOSIS — I498 Other specified cardiac arrhythmias: Secondary | ICD-10-CM | POA: Diagnosis not present

## 2020-03-02 DIAGNOSIS — M79604 Pain in right leg: Secondary | ICD-10-CM | POA: Diagnosis not present

## 2020-03-02 DIAGNOSIS — Z6825 Body mass index (BMI) 25.0-25.9, adult: Secondary | ICD-10-CM | POA: Diagnosis not present

## 2020-03-02 DIAGNOSIS — L84 Corns and callosities: Secondary | ICD-10-CM | POA: Diagnosis not present

## 2020-03-02 DIAGNOSIS — B029 Zoster without complications: Secondary | ICD-10-CM | POA: Diagnosis not present

## 2020-03-02 DIAGNOSIS — W57XXXA Bitten or stung by nonvenomous insect and other nonvenomous arthropods, initial encounter: Secondary | ICD-10-CM | POA: Diagnosis not present

## 2020-03-02 DIAGNOSIS — G5762 Lesion of plantar nerve, left lower limb: Secondary | ICD-10-CM | POA: Diagnosis not present

## 2020-03-02 DIAGNOSIS — M316 Other giant cell arteritis: Secondary | ICD-10-CM | POA: Diagnosis not present

## 2020-03-02 DIAGNOSIS — G500 Trigeminal neuralgia: Secondary | ICD-10-CM | POA: Diagnosis not present

## 2020-03-02 DIAGNOSIS — I2510 Atherosclerotic heart disease of native coronary artery without angina pectoris: Secondary | ICD-10-CM | POA: Diagnosis not present

## 2020-03-03 DIAGNOSIS — Z8543 Personal history of malignant neoplasm of ovary: Secondary | ICD-10-CM | POA: Diagnosis not present

## 2020-03-03 DIAGNOSIS — R300 Dysuria: Secondary | ICD-10-CM | POA: Diagnosis not present

## 2020-03-03 DIAGNOSIS — B3781 Candidal esophagitis: Secondary | ICD-10-CM | POA: Diagnosis not present

## 2020-03-03 DIAGNOSIS — M79644 Pain in right finger(s): Secondary | ICD-10-CM | POA: Diagnosis not present

## 2020-03-03 DIAGNOSIS — Z7902 Long term (current) use of antithrombotics/antiplatelets: Secondary | ICD-10-CM | POA: Diagnosis not present

## 2020-03-03 DIAGNOSIS — E1140 Type 2 diabetes mellitus with diabetic neuropathy, unspecified: Secondary | ICD-10-CM | POA: Diagnosis not present

## 2020-03-03 DIAGNOSIS — M1991 Primary osteoarthritis, unspecified site: Secondary | ICD-10-CM | POA: Diagnosis not present

## 2020-03-03 DIAGNOSIS — L538 Other specified erythematous conditions: Secondary | ICD-10-CM | POA: Diagnosis not present

## 2020-03-03 DIAGNOSIS — Z95810 Presence of automatic (implantable) cardiac defibrillator: Secondary | ICD-10-CM | POA: Diagnosis not present

## 2020-03-03 DIAGNOSIS — J84112 Idiopathic pulmonary fibrosis: Secondary | ICD-10-CM | POA: Diagnosis not present

## 2020-03-03 DIAGNOSIS — G8929 Other chronic pain: Secondary | ICD-10-CM | POA: Diagnosis not present

## 2020-03-03 DIAGNOSIS — L814 Other melanin hyperpigmentation: Secondary | ICD-10-CM | POA: Diagnosis not present

## 2020-03-03 DIAGNOSIS — H25041 Posterior subcapsular polar age-related cataract, right eye: Secondary | ICD-10-CM | POA: Diagnosis not present

## 2020-03-03 DIAGNOSIS — C801 Malignant (primary) neoplasm, unspecified: Secondary | ICD-10-CM | POA: Diagnosis not present

## 2020-03-03 DIAGNOSIS — I25810 Atherosclerosis of coronary artery bypass graft(s) without angina pectoris: Secondary | ICD-10-CM | POA: Diagnosis not present

## 2020-03-03 DIAGNOSIS — W19XXXA Unspecified fall, initial encounter: Secondary | ICD-10-CM | POA: Diagnosis not present

## 2020-03-03 DIAGNOSIS — H35711 Central serous chorioretinopathy, right eye: Secondary | ICD-10-CM | POA: Diagnosis not present

## 2020-03-03 DIAGNOSIS — Q87410 Marfan's syndrome with aortic dilation: Secondary | ICD-10-CM | POA: Diagnosis not present

## 2020-03-03 DIAGNOSIS — Z170 Estrogen receptor positive status [ER+]: Secondary | ICD-10-CM | POA: Diagnosis not present

## 2020-03-03 DIAGNOSIS — R7303 Prediabetes: Secondary | ICD-10-CM | POA: Diagnosis not present

## 2020-03-03 DIAGNOSIS — N201 Calculus of ureter: Secondary | ICD-10-CM | POA: Diagnosis not present

## 2020-03-03 DIAGNOSIS — M7502 Adhesive capsulitis of left shoulder: Secondary | ICD-10-CM | POA: Diagnosis not present

## 2020-03-03 DIAGNOSIS — K625 Hemorrhage of anus and rectum: Secondary | ICD-10-CM | POA: Diagnosis not present

## 2020-03-03 DIAGNOSIS — Z87440 Personal history of urinary (tract) infections: Secondary | ICD-10-CM | POA: Diagnosis not present

## 2020-03-03 DIAGNOSIS — S42201A Unspecified fracture of upper end of right humerus, initial encounter for closed fracture: Secondary | ICD-10-CM | POA: Diagnosis not present

## 2020-03-03 DIAGNOSIS — H6120 Impacted cerumen, unspecified ear: Secondary | ICD-10-CM | POA: Diagnosis not present

## 2020-03-03 DIAGNOSIS — D3132 Benign neoplasm of left choroid: Secondary | ICD-10-CM | POA: Diagnosis not present

## 2020-03-03 DIAGNOSIS — R3912 Poor urinary stream: Secondary | ICD-10-CM | POA: Diagnosis not present

## 2020-03-03 DIAGNOSIS — C4442 Squamous cell carcinoma of skin of scalp and neck: Secondary | ICD-10-CM | POA: Diagnosis not present

## 2020-03-03 DIAGNOSIS — M79605 Pain in left leg: Secondary | ICD-10-CM | POA: Diagnosis not present

## 2020-03-03 DIAGNOSIS — E1121 Type 2 diabetes mellitus with diabetic nephropathy: Secondary | ICD-10-CM | POA: Diagnosis not present

## 2020-03-03 DIAGNOSIS — R682 Dry mouth, unspecified: Secondary | ICD-10-CM | POA: Diagnosis not present

## 2020-03-03 DIAGNOSIS — C9110 Chronic lymphocytic leukemia of B-cell type not having achieved remission: Secondary | ICD-10-CM | POA: Diagnosis not present

## 2020-03-03 DIAGNOSIS — E1151 Type 2 diabetes mellitus with diabetic peripheral angiopathy without gangrene: Secondary | ICD-10-CM | POA: Diagnosis not present

## 2020-03-03 DIAGNOSIS — D500 Iron deficiency anemia secondary to blood loss (chronic): Secondary | ICD-10-CM | POA: Diagnosis not present

## 2020-03-03 DIAGNOSIS — G4730 Sleep apnea, unspecified: Secondary | ICD-10-CM | POA: Diagnosis not present

## 2020-03-03 DIAGNOSIS — M5416 Radiculopathy, lumbar region: Secondary | ICD-10-CM | POA: Diagnosis not present

## 2020-03-03 DIAGNOSIS — H26491 Other secondary cataract, right eye: Secondary | ICD-10-CM | POA: Diagnosis not present

## 2020-03-03 DIAGNOSIS — H52203 Unspecified astigmatism, bilateral: Secondary | ICD-10-CM | POA: Diagnosis not present

## 2020-03-03 DIAGNOSIS — Z7689 Persons encountering health services in other specified circumstances: Secondary | ICD-10-CM | POA: Diagnosis not present

## 2020-03-03 DIAGNOSIS — H401122 Primary open-angle glaucoma, left eye, moderate stage: Secondary | ICD-10-CM | POA: Diagnosis not present

## 2020-03-03 DIAGNOSIS — M9903 Segmental and somatic dysfunction of lumbar region: Secondary | ICD-10-CM | POA: Diagnosis not present

## 2020-03-03 DIAGNOSIS — H353211 Exudative age-related macular degeneration, right eye, with active choroidal neovascularization: Secondary | ICD-10-CM | POA: Diagnosis not present

## 2020-03-03 DIAGNOSIS — M4316 Spondylolisthesis, lumbar region: Secondary | ICD-10-CM | POA: Diagnosis not present

## 2020-03-03 DIAGNOSIS — M25611 Stiffness of right shoulder, not elsewhere classified: Secondary | ICD-10-CM | POA: Diagnosis not present

## 2020-03-03 DIAGNOSIS — Z961 Presence of intraocular lens: Secondary | ICD-10-CM | POA: Diagnosis not present

## 2020-03-03 DIAGNOSIS — Z6841 Body Mass Index (BMI) 40.0 and over, adult: Secondary | ICD-10-CM | POA: Diagnosis not present

## 2020-03-03 DIAGNOSIS — Z124 Encounter for screening for malignant neoplasm of cervix: Secondary | ICD-10-CM | POA: Diagnosis not present

## 2020-03-03 DIAGNOSIS — K2100 Gastro-esophageal reflux disease with esophagitis, without bleeding: Secondary | ICD-10-CM | POA: Diagnosis not present

## 2020-03-03 DIAGNOSIS — S6982XA Other specified injuries of left wrist, hand and finger(s), initial encounter: Secondary | ICD-10-CM | POA: Diagnosis not present

## 2020-03-03 DIAGNOSIS — M79674 Pain in right toe(s): Secondary | ICD-10-CM | POA: Diagnosis not present

## 2020-03-03 DIAGNOSIS — F429 Obsessive-compulsive disorder, unspecified: Secondary | ICD-10-CM | POA: Diagnosis not present

## 2020-03-03 DIAGNOSIS — Z87891 Personal history of nicotine dependence: Secondary | ICD-10-CM | POA: Diagnosis not present

## 2020-03-03 DIAGNOSIS — S0003XA Contusion of scalp, initial encounter: Secondary | ICD-10-CM | POA: Diagnosis not present

## 2020-03-03 DIAGNOSIS — I513 Intracardiac thrombosis, not elsewhere classified: Secondary | ICD-10-CM | POA: Diagnosis not present

## 2020-03-03 DIAGNOSIS — D485 Neoplasm of uncertain behavior of skin: Secondary | ICD-10-CM | POA: Diagnosis not present

## 2020-03-03 DIAGNOSIS — M25572 Pain in left ankle and joints of left foot: Secondary | ICD-10-CM | POA: Diagnosis not present

## 2020-03-03 DIAGNOSIS — M79631 Pain in right forearm: Secondary | ICD-10-CM | POA: Diagnosis not present

## 2020-03-03 DIAGNOSIS — K7031 Alcoholic cirrhosis of liver with ascites: Secondary | ICD-10-CM | POA: Diagnosis not present

## 2020-03-03 DIAGNOSIS — K611 Rectal abscess: Secondary | ICD-10-CM | POA: Diagnosis not present

## 2020-03-03 DIAGNOSIS — D682 Hereditary deficiency of other clotting factors: Secondary | ICD-10-CM | POA: Diagnosis not present

## 2020-03-03 DIAGNOSIS — M25511 Pain in right shoulder: Secondary | ICD-10-CM | POA: Diagnosis not present

## 2020-03-03 DIAGNOSIS — S72401G Unspecified fracture of lower end of right femur, subsequent encounter for closed fracture with delayed healing: Secondary | ICD-10-CM | POA: Diagnosis not present

## 2020-03-03 DIAGNOSIS — L84 Corns and callosities: Secondary | ICD-10-CM | POA: Diagnosis not present

## 2020-03-03 DIAGNOSIS — I5181 Takotsubo syndrome: Secondary | ICD-10-CM | POA: Diagnosis not present

## 2020-03-03 DIAGNOSIS — C44619 Basal cell carcinoma of skin of left upper limb, including shoulder: Secondary | ICD-10-CM | POA: Diagnosis not present

## 2020-03-03 DIAGNOSIS — Z980 Intestinal bypass and anastomosis status: Secondary | ICD-10-CM | POA: Diagnosis not present

## 2020-03-03 DIAGNOSIS — Z79891 Long term (current) use of opiate analgesic: Secondary | ICD-10-CM | POA: Diagnosis not present

## 2020-03-03 DIAGNOSIS — J984 Other disorders of lung: Secondary | ICD-10-CM | POA: Diagnosis not present

## 2020-03-03 DIAGNOSIS — R931 Abnormal findings on diagnostic imaging of heart and coronary circulation: Secondary | ICD-10-CM | POA: Diagnosis not present

## 2020-03-03 DIAGNOSIS — R1084 Generalized abdominal pain: Secondary | ICD-10-CM | POA: Diagnosis not present

## 2020-03-03 DIAGNOSIS — H401113 Primary open-angle glaucoma, right eye, severe stage: Secondary | ICD-10-CM | POA: Diagnosis not present

## 2020-03-03 DIAGNOSIS — Z96659 Presence of unspecified artificial knee joint: Secondary | ICD-10-CM | POA: Diagnosis not present

## 2020-03-03 DIAGNOSIS — M5413 Radiculopathy, cervicothoracic region: Secondary | ICD-10-CM | POA: Diagnosis not present

## 2020-03-03 DIAGNOSIS — F418 Other specified anxiety disorders: Secondary | ICD-10-CM | POA: Diagnosis not present

## 2020-03-03 DIAGNOSIS — R3989 Other symptoms and signs involving the genitourinary system: Secondary | ICD-10-CM | POA: Diagnosis not present

## 2020-03-03 DIAGNOSIS — J668 Airway disease due to other specific organic dusts: Secondary | ICD-10-CM | POA: Diagnosis not present

## 2020-03-03 DIAGNOSIS — G479 Sleep disorder, unspecified: Secondary | ICD-10-CM | POA: Diagnosis not present

## 2020-03-03 DIAGNOSIS — K449 Diaphragmatic hernia without obstruction or gangrene: Secondary | ICD-10-CM | POA: Diagnosis not present

## 2020-03-03 DIAGNOSIS — R002 Palpitations: Secondary | ICD-10-CM | POA: Diagnosis not present

## 2020-03-03 DIAGNOSIS — Z1389 Encounter for screening for other disorder: Secondary | ICD-10-CM | POA: Diagnosis not present

## 2020-03-03 DIAGNOSIS — R4182 Altered mental status, unspecified: Secondary | ICD-10-CM | POA: Diagnosis not present

## 2020-03-03 DIAGNOSIS — R222 Localized swelling, mass and lump, trunk: Secondary | ICD-10-CM | POA: Diagnosis not present

## 2020-03-03 DIAGNOSIS — J029 Acute pharyngitis, unspecified: Secondary | ICD-10-CM | POA: Diagnosis not present

## 2020-03-03 DIAGNOSIS — J431 Panlobular emphysema: Secondary | ICD-10-CM | POA: Diagnosis not present

## 2020-03-03 DIAGNOSIS — L03031 Cellulitis of right toe: Secondary | ICD-10-CM | POA: Diagnosis not present

## 2020-03-03 DIAGNOSIS — U071 COVID-19: Secondary | ICD-10-CM | POA: Diagnosis not present

## 2020-03-03 DIAGNOSIS — I498 Other specified cardiac arrhythmias: Secondary | ICD-10-CM | POA: Diagnosis not present

## 2020-03-03 DIAGNOSIS — T7840XD Allergy, unspecified, subsequent encounter: Secondary | ICD-10-CM | POA: Diagnosis not present

## 2020-03-03 DIAGNOSIS — M1990 Unspecified osteoarthritis, unspecified site: Secondary | ICD-10-CM | POA: Diagnosis not present

## 2020-03-03 DIAGNOSIS — K317 Polyp of stomach and duodenum: Secondary | ICD-10-CM | POA: Diagnosis not present

## 2020-03-03 DIAGNOSIS — H35371 Puckering of macula, right eye: Secondary | ICD-10-CM | POA: Diagnosis not present

## 2020-03-03 DIAGNOSIS — M25551 Pain in right hip: Secondary | ICD-10-CM | POA: Diagnosis not present

## 2020-03-03 DIAGNOSIS — D7581 Myelofibrosis: Secondary | ICD-10-CM | POA: Diagnosis not present

## 2020-03-03 DIAGNOSIS — N1832 Chronic kidney disease, stage 3b: Secondary | ICD-10-CM | POA: Diagnosis not present

## 2020-03-03 DIAGNOSIS — M7542 Impingement syndrome of left shoulder: Secondary | ICD-10-CM | POA: Diagnosis not present

## 2020-03-03 DIAGNOSIS — R194 Change in bowel habit: Secondary | ICD-10-CM | POA: Diagnosis not present

## 2020-03-03 DIAGNOSIS — J4541 Moderate persistent asthma with (acute) exacerbation: Secondary | ICD-10-CM | POA: Diagnosis not present

## 2020-03-03 DIAGNOSIS — B9689 Other specified bacterial agents as the cause of diseases classified elsewhere: Secondary | ICD-10-CM | POA: Diagnosis not present

## 2020-03-03 DIAGNOSIS — H524 Presbyopia: Secondary | ICD-10-CM | POA: Diagnosis not present

## 2020-03-03 DIAGNOSIS — M961 Postlaminectomy syndrome, not elsewhere classified: Secondary | ICD-10-CM | POA: Diagnosis not present

## 2020-03-03 DIAGNOSIS — Z90710 Acquired absence of both cervix and uterus: Secondary | ICD-10-CM | POA: Diagnosis not present

## 2020-03-03 DIAGNOSIS — I5022 Chronic systolic (congestive) heart failure: Secondary | ICD-10-CM | POA: Diagnosis not present

## 2020-03-03 DIAGNOSIS — M545 Low back pain: Secondary | ICD-10-CM | POA: Diagnosis not present

## 2020-03-03 DIAGNOSIS — K52831 Collagenous colitis: Secondary | ICD-10-CM | POA: Diagnosis not present

## 2020-03-03 DIAGNOSIS — H02834 Dermatochalasis of left upper eyelid: Secondary | ICD-10-CM | POA: Diagnosis not present

## 2020-03-03 DIAGNOSIS — C50911 Malignant neoplasm of unspecified site of right female breast: Secondary | ICD-10-CM | POA: Diagnosis not present

## 2020-03-03 DIAGNOSIS — I10 Essential (primary) hypertension: Secondary | ICD-10-CM | POA: Diagnosis not present

## 2020-03-03 DIAGNOSIS — J42 Unspecified chronic bronchitis: Secondary | ICD-10-CM | POA: Diagnosis not present

## 2020-03-03 DIAGNOSIS — S233XXA Sprain of ligaments of thoracic spine, initial encounter: Secondary | ICD-10-CM | POA: Diagnosis not present

## 2020-03-03 DIAGNOSIS — Z6826 Body mass index (BMI) 26.0-26.9, adult: Secondary | ICD-10-CM | POA: Diagnosis not present

## 2020-03-03 DIAGNOSIS — M461 Sacroiliitis, not elsewhere classified: Secondary | ICD-10-CM | POA: Diagnosis not present

## 2020-03-03 DIAGNOSIS — K6289 Other specified diseases of anus and rectum: Secondary | ICD-10-CM | POA: Diagnosis not present

## 2020-03-03 DIAGNOSIS — E669 Obesity, unspecified: Secondary | ICD-10-CM | POA: Diagnosis not present

## 2020-03-03 DIAGNOSIS — R6881 Early satiety: Secondary | ICD-10-CM | POA: Diagnosis not present

## 2020-03-03 DIAGNOSIS — R87810 Cervical high risk human papillomavirus (HPV) DNA test positive: Secondary | ICD-10-CM | POA: Diagnosis not present

## 2020-03-03 DIAGNOSIS — I83813 Varicose veins of bilateral lower extremities with pain: Secondary | ICD-10-CM | POA: Diagnosis not present

## 2020-03-03 DIAGNOSIS — G5601 Carpal tunnel syndrome, right upper limb: Secondary | ICD-10-CM | POA: Diagnosis not present

## 2020-03-03 DIAGNOSIS — R935 Abnormal findings on diagnostic imaging of other abdominal regions, including retroperitoneum: Secondary | ICD-10-CM | POA: Diagnosis not present

## 2020-03-03 DIAGNOSIS — I160 Hypertensive urgency: Secondary | ICD-10-CM | POA: Diagnosis not present

## 2020-03-03 DIAGNOSIS — S20211A Contusion of right front wall of thorax, initial encounter: Secondary | ICD-10-CM | POA: Diagnosis not present

## 2020-03-03 DIAGNOSIS — I120 Hypertensive chronic kidney disease with stage 5 chronic kidney disease or end stage renal disease: Secondary | ICD-10-CM | POA: Diagnosis not present

## 2020-03-03 DIAGNOSIS — I872 Venous insufficiency (chronic) (peripheral): Secondary | ICD-10-CM | POA: Diagnosis not present

## 2020-03-03 DIAGNOSIS — X32XXXA Exposure to sunlight, initial encounter: Secondary | ICD-10-CM | POA: Diagnosis not present

## 2020-03-03 DIAGNOSIS — Z113 Encounter for screening for infections with a predominantly sexual mode of transmission: Secondary | ICD-10-CM | POA: Diagnosis not present

## 2020-03-03 DIAGNOSIS — D175 Benign lipomatous neoplasm of intra-abdominal organs: Secondary | ICD-10-CM | POA: Diagnosis not present

## 2020-03-03 DIAGNOSIS — H21543 Posterior synechiae (iris), bilateral: Secondary | ICD-10-CM | POA: Diagnosis not present

## 2020-03-03 DIAGNOSIS — R404 Transient alteration of awareness: Secondary | ICD-10-CM | POA: Diagnosis not present

## 2020-03-03 DIAGNOSIS — K229 Disease of esophagus, unspecified: Secondary | ICD-10-CM | POA: Diagnosis not present

## 2020-03-03 DIAGNOSIS — K649 Unspecified hemorrhoids: Secondary | ICD-10-CM | POA: Diagnosis not present

## 2020-03-03 DIAGNOSIS — C9190 Lymphoid leukemia, unspecified not having achieved remission: Secondary | ICD-10-CM | POA: Diagnosis not present

## 2020-03-03 DIAGNOSIS — I208 Other forms of angina pectoris: Secondary | ICD-10-CM | POA: Diagnosis not present

## 2020-03-03 DIAGNOSIS — K862 Cyst of pancreas: Secondary | ICD-10-CM | POA: Diagnosis not present

## 2020-03-03 DIAGNOSIS — K7589 Other specified inflammatory liver diseases: Secondary | ICD-10-CM | POA: Diagnosis not present

## 2020-03-03 DIAGNOSIS — Z954 Presence of other heart-valve replacement: Secondary | ICD-10-CM | POA: Diagnosis not present

## 2020-03-03 DIAGNOSIS — F0281 Dementia in other diseases classified elsewhere with behavioral disturbance: Secondary | ICD-10-CM | POA: Diagnosis not present

## 2020-03-03 DIAGNOSIS — H353112 Nonexudative age-related macular degeneration, right eye, intermediate dry stage: Secondary | ICD-10-CM | POA: Diagnosis not present

## 2020-03-03 DIAGNOSIS — J342 Deviated nasal septum: Secondary | ICD-10-CM | POA: Diagnosis not present

## 2020-03-03 DIAGNOSIS — Z87892 Personal history of anaphylaxis: Secondary | ICD-10-CM | POA: Diagnosis not present

## 2020-03-03 DIAGNOSIS — M7581 Other shoulder lesions, right shoulder: Secondary | ICD-10-CM | POA: Diagnosis not present

## 2020-03-03 DIAGNOSIS — H43391 Other vitreous opacities, right eye: Secondary | ICD-10-CM | POA: Diagnosis not present

## 2020-03-03 DIAGNOSIS — F411 Generalized anxiety disorder: Secondary | ICD-10-CM | POA: Diagnosis not present

## 2020-03-03 DIAGNOSIS — L03115 Cellulitis of right lower limb: Secondary | ICD-10-CM | POA: Diagnosis not present

## 2020-03-03 DIAGNOSIS — Z9989 Dependence on other enabling machines and devices: Secondary | ICD-10-CM | POA: Diagnosis not present

## 2020-03-03 DIAGNOSIS — M7062 Trochanteric bursitis, left hip: Secondary | ICD-10-CM | POA: Diagnosis not present

## 2020-03-03 DIAGNOSIS — M25859 Other specified joint disorders, unspecified hip: Secondary | ICD-10-CM | POA: Diagnosis not present

## 2020-03-03 DIAGNOSIS — I779 Disorder of arteries and arterioles, unspecified: Secondary | ICD-10-CM | POA: Diagnosis not present

## 2020-03-03 DIAGNOSIS — I5020 Unspecified systolic (congestive) heart failure: Secondary | ICD-10-CM | POA: Diagnosis not present

## 2020-03-03 DIAGNOSIS — D693 Immune thrombocytopenic purpura: Secondary | ICD-10-CM | POA: Diagnosis not present

## 2020-03-03 DIAGNOSIS — H16103 Unspecified superficial keratitis, bilateral: Secondary | ICD-10-CM | POA: Diagnosis not present

## 2020-03-03 DIAGNOSIS — E1169 Type 2 diabetes mellitus with other specified complication: Secondary | ICD-10-CM | POA: Diagnosis not present

## 2020-03-03 DIAGNOSIS — F17290 Nicotine dependence, other tobacco product, uncomplicated: Secondary | ICD-10-CM | POA: Diagnosis not present

## 2020-03-03 DIAGNOSIS — K5903 Drug induced constipation: Secondary | ICD-10-CM | POA: Diagnosis not present

## 2020-03-03 DIAGNOSIS — M5441 Lumbago with sciatica, right side: Secondary | ICD-10-CM | POA: Diagnosis not present

## 2020-03-03 DIAGNOSIS — J851 Abscess of lung with pneumonia: Secondary | ICD-10-CM | POA: Diagnosis not present

## 2020-03-03 DIAGNOSIS — H538 Other visual disturbances: Secondary | ICD-10-CM | POA: Diagnosis not present

## 2020-03-03 DIAGNOSIS — H5212 Myopia, left eye: Secondary | ICD-10-CM | POA: Diagnosis not present

## 2020-03-03 DIAGNOSIS — Q600 Renal agenesis, unilateral: Secondary | ICD-10-CM | POA: Diagnosis not present

## 2020-03-03 DIAGNOSIS — I255 Ischemic cardiomyopathy: Secondary | ICD-10-CM | POA: Diagnosis not present

## 2020-03-03 DIAGNOSIS — Z8673 Personal history of transient ischemic attack (TIA), and cerebral infarction without residual deficits: Secondary | ICD-10-CM | POA: Diagnosis not present

## 2020-03-03 DIAGNOSIS — F332 Major depressive disorder, recurrent severe without psychotic features: Secondary | ICD-10-CM | POA: Diagnosis not present

## 2020-03-03 DIAGNOSIS — Z800 Family history of malignant neoplasm of digestive organs: Secondary | ICD-10-CM | POA: Diagnosis not present

## 2020-03-03 DIAGNOSIS — D689 Coagulation defect, unspecified: Secondary | ICD-10-CM | POA: Diagnosis not present

## 2020-03-03 DIAGNOSIS — Z681 Body mass index (BMI) 19 or less, adult: Secondary | ICD-10-CM | POA: Diagnosis not present

## 2020-03-03 DIAGNOSIS — G8918 Other acute postprocedural pain: Secondary | ICD-10-CM | POA: Diagnosis not present

## 2020-03-03 DIAGNOSIS — H353133 Nonexudative age-related macular degeneration, bilateral, advanced atrophic without subfoveal involvement: Secondary | ICD-10-CM | POA: Diagnosis not present

## 2020-03-03 DIAGNOSIS — F0150 Vascular dementia without behavioral disturbance: Secondary | ICD-10-CM | POA: Diagnosis not present

## 2020-03-03 DIAGNOSIS — E872 Acidosis: Secondary | ICD-10-CM | POA: Diagnosis not present

## 2020-03-03 DIAGNOSIS — L4050 Arthropathic psoriasis, unspecified: Secondary | ICD-10-CM | POA: Diagnosis not present

## 2020-03-03 DIAGNOSIS — M25411 Effusion, right shoulder: Secondary | ICD-10-CM | POA: Diagnosis not present

## 2020-03-03 DIAGNOSIS — H6123 Impacted cerumen, bilateral: Secondary | ICD-10-CM | POA: Diagnosis not present

## 2020-03-03 DIAGNOSIS — M1A0720 Idiopathic chronic gout, left ankle and foot, without tophus (tophi): Secondary | ICD-10-CM | POA: Diagnosis not present

## 2020-03-03 DIAGNOSIS — Z91041 Radiographic dye allergy status: Secondary | ICD-10-CM | POA: Diagnosis not present

## 2020-03-03 DIAGNOSIS — K31811 Angiodysplasia of stomach and duodenum with bleeding: Secondary | ICD-10-CM | POA: Diagnosis not present

## 2020-03-03 DIAGNOSIS — I429 Cardiomyopathy, unspecified: Secondary | ICD-10-CM | POA: Diagnosis not present

## 2020-03-03 DIAGNOSIS — Z48812 Encounter for surgical aftercare following surgery on the circulatory system: Secondary | ICD-10-CM | POA: Diagnosis not present

## 2020-03-03 DIAGNOSIS — S42209A Unspecified fracture of upper end of unspecified humerus, initial encounter for closed fracture: Secondary | ICD-10-CM | POA: Diagnosis not present

## 2020-03-03 DIAGNOSIS — K921 Melena: Secondary | ICD-10-CM | POA: Diagnosis not present

## 2020-03-03 DIAGNOSIS — H60332 Swimmer's ear, left ear: Secondary | ICD-10-CM | POA: Diagnosis not present

## 2020-03-03 DIAGNOSIS — Z8616 Personal history of COVID-19: Secondary | ICD-10-CM | POA: Diagnosis not present

## 2020-03-03 DIAGNOSIS — Z86010 Personal history of colonic polyps: Secondary | ICD-10-CM | POA: Diagnosis not present

## 2020-03-03 DIAGNOSIS — Z888 Allergy status to other drugs, medicaments and biological substances status: Secondary | ICD-10-CM | POA: Diagnosis not present

## 2020-03-03 DIAGNOSIS — Z6828 Body mass index (BMI) 28.0-28.9, adult: Secondary | ICD-10-CM | POA: Diagnosis not present

## 2020-03-03 DIAGNOSIS — L89152 Pressure ulcer of sacral region, stage 2: Secondary | ICD-10-CM | POA: Diagnosis not present

## 2020-03-03 DIAGNOSIS — M48062 Spinal stenosis, lumbar region with neurogenic claudication: Secondary | ICD-10-CM | POA: Diagnosis not present

## 2020-03-03 DIAGNOSIS — I8311 Varicose veins of right lower extremity with inflammation: Secondary | ICD-10-CM | POA: Diagnosis not present

## 2020-03-03 DIAGNOSIS — R110 Nausea: Secondary | ICD-10-CM | POA: Diagnosis not present

## 2020-03-03 DIAGNOSIS — H7493 Unspecified disorder of middle ear and mastoid, bilateral: Secondary | ICD-10-CM | POA: Diagnosis not present

## 2020-03-03 DIAGNOSIS — E890 Postprocedural hypothyroidism: Secondary | ICD-10-CM | POA: Diagnosis not present

## 2020-03-03 DIAGNOSIS — Z955 Presence of coronary angioplasty implant and graft: Secondary | ICD-10-CM | POA: Diagnosis not present

## 2020-03-03 DIAGNOSIS — I82402 Acute embolism and thrombosis of unspecified deep veins of left lower extremity: Secondary | ICD-10-CM | POA: Diagnosis not present

## 2020-03-03 DIAGNOSIS — F40240 Claustrophobia: Secondary | ICD-10-CM | POA: Diagnosis not present

## 2020-03-03 DIAGNOSIS — R609 Edema, unspecified: Secondary | ICD-10-CM | POA: Diagnosis not present

## 2020-03-03 DIAGNOSIS — C3492 Malignant neoplasm of unspecified part of left bronchus or lung: Secondary | ICD-10-CM | POA: Diagnosis not present

## 2020-03-03 DIAGNOSIS — H903 Sensorineural hearing loss, bilateral: Secondary | ICD-10-CM | POA: Diagnosis not present

## 2020-03-03 DIAGNOSIS — K8010 Calculus of gallbladder with chronic cholecystitis without obstruction: Secondary | ICD-10-CM | POA: Diagnosis not present

## 2020-03-03 DIAGNOSIS — K766 Portal hypertension: Secondary | ICD-10-CM | POA: Diagnosis not present

## 2020-03-03 DIAGNOSIS — Z6832 Body mass index (BMI) 32.0-32.9, adult: Secondary | ICD-10-CM | POA: Diagnosis not present

## 2020-03-03 DIAGNOSIS — F3342 Major depressive disorder, recurrent, in full remission: Secondary | ICD-10-CM | POA: Diagnosis not present

## 2020-03-03 DIAGNOSIS — Z853 Personal history of malignant neoplasm of breast: Secondary | ICD-10-CM | POA: Diagnosis not present

## 2020-03-03 DIAGNOSIS — F329 Major depressive disorder, single episode, unspecified: Secondary | ICD-10-CM | POA: Diagnosis not present

## 2020-03-03 DIAGNOSIS — H0288A Meibomian gland dysfunction right eye, upper and lower eyelids: Secondary | ICD-10-CM | POA: Diagnosis not present

## 2020-03-03 DIAGNOSIS — K592 Neurogenic bowel, not elsewhere classified: Secondary | ICD-10-CM | POA: Diagnosis not present

## 2020-03-03 DIAGNOSIS — M25532 Pain in left wrist: Secondary | ICD-10-CM | POA: Diagnosis not present

## 2020-03-03 DIAGNOSIS — G43909 Migraine, unspecified, not intractable, without status migrainosus: Secondary | ICD-10-CM | POA: Diagnosis not present

## 2020-03-03 DIAGNOSIS — I639 Cerebral infarction, unspecified: Secondary | ICD-10-CM | POA: Diagnosis not present

## 2020-03-03 DIAGNOSIS — I6930 Unspecified sequelae of cerebral infarction: Secondary | ICD-10-CM | POA: Diagnosis not present

## 2020-03-03 DIAGNOSIS — H40013 Open angle with borderline findings, low risk, bilateral: Secondary | ICD-10-CM | POA: Diagnosis not present

## 2020-03-03 DIAGNOSIS — C9001 Multiple myeloma in remission: Secondary | ICD-10-CM | POA: Diagnosis not present

## 2020-03-03 DIAGNOSIS — R0602 Shortness of breath: Secondary | ICD-10-CM | POA: Diagnosis not present

## 2020-03-03 DIAGNOSIS — H353213 Exudative age-related macular degeneration, right eye, with inactive scar: Secondary | ICD-10-CM | POA: Diagnosis not present

## 2020-03-03 DIAGNOSIS — M25652 Stiffness of left hip, not elsewhere classified: Secondary | ICD-10-CM | POA: Diagnosis not present

## 2020-03-03 DIAGNOSIS — N202 Calculus of kidney with calculus of ureter: Secondary | ICD-10-CM | POA: Diagnosis not present

## 2020-03-03 DIAGNOSIS — Z96653 Presence of artificial knee joint, bilateral: Secondary | ICD-10-CM | POA: Diagnosis not present

## 2020-03-03 DIAGNOSIS — I350 Nonrheumatic aortic (valve) stenosis: Secondary | ICD-10-CM | POA: Diagnosis not present

## 2020-03-03 DIAGNOSIS — M19029 Primary osteoarthritis, unspecified elbow: Secondary | ICD-10-CM | POA: Diagnosis not present

## 2020-03-03 DIAGNOSIS — I2693 Single subsegmental pulmonary embolism without acute cor pulmonale: Secondary | ICD-10-CM | POA: Diagnosis not present

## 2020-03-03 DIAGNOSIS — M4327 Fusion of spine, lumbosacral region: Secondary | ICD-10-CM | POA: Diagnosis not present

## 2020-03-03 DIAGNOSIS — M19019 Primary osteoarthritis, unspecified shoulder: Secondary | ICD-10-CM | POA: Diagnosis not present

## 2020-03-03 DIAGNOSIS — K222 Esophageal obstruction: Secondary | ICD-10-CM | POA: Diagnosis not present

## 2020-03-03 DIAGNOSIS — J9601 Acute respiratory failure with hypoxia: Secondary | ICD-10-CM | POA: Diagnosis not present

## 2020-03-03 DIAGNOSIS — I69354 Hemiplegia and hemiparesis following cerebral infarction affecting left non-dominant side: Secondary | ICD-10-CM | POA: Diagnosis not present

## 2020-03-03 DIAGNOSIS — I495 Sick sinus syndrome: Secondary | ICD-10-CM | POA: Diagnosis not present

## 2020-03-03 DIAGNOSIS — G894 Chronic pain syndrome: Secondary | ICD-10-CM | POA: Diagnosis not present

## 2020-03-03 DIAGNOSIS — E119 Type 2 diabetes mellitus without complications: Secondary | ICD-10-CM | POA: Diagnosis not present

## 2020-03-03 DIAGNOSIS — L03221 Cellulitis of neck: Secondary | ICD-10-CM | POA: Diagnosis not present

## 2020-03-03 DIAGNOSIS — N170 Acute kidney failure with tubular necrosis: Secondary | ICD-10-CM | POA: Diagnosis not present

## 2020-03-03 DIAGNOSIS — T7800XD Anaphylactic reaction due to unspecified food, subsequent encounter: Secondary | ICD-10-CM | POA: Diagnosis not present

## 2020-03-03 DIAGNOSIS — S61452A Open bite of left hand, initial encounter: Secondary | ICD-10-CM | POA: Diagnosis not present

## 2020-03-03 DIAGNOSIS — I482 Chronic atrial fibrillation, unspecified: Secondary | ICD-10-CM | POA: Diagnosis not present

## 2020-03-03 DIAGNOSIS — M25571 Pain in right ankle and joints of right foot: Secondary | ICD-10-CM | POA: Diagnosis not present

## 2020-03-03 DIAGNOSIS — C44329 Squamous cell carcinoma of skin of other parts of face: Secondary | ICD-10-CM | POA: Diagnosis not present

## 2020-03-03 DIAGNOSIS — E782 Mixed hyperlipidemia: Secondary | ICD-10-CM | POA: Diagnosis not present

## 2020-03-03 DIAGNOSIS — M797 Fibromyalgia: Secondary | ICD-10-CM | POA: Diagnosis not present

## 2020-03-03 DIAGNOSIS — M8588 Other specified disorders of bone density and structure, other site: Secondary | ICD-10-CM | POA: Diagnosis not present

## 2020-03-03 DIAGNOSIS — W540XXA Bitten by dog, initial encounter: Secondary | ICD-10-CM | POA: Diagnosis not present

## 2020-03-03 DIAGNOSIS — D229 Melanocytic nevi, unspecified: Secondary | ICD-10-CM | POA: Diagnosis not present

## 2020-03-03 DIAGNOSIS — M2550 Pain in unspecified joint: Secondary | ICD-10-CM | POA: Diagnosis not present

## 2020-03-03 DIAGNOSIS — Z5181 Encounter for therapeutic drug level monitoring: Secondary | ICD-10-CM | POA: Diagnosis not present

## 2020-03-03 DIAGNOSIS — C880 Waldenstrom macroglobulinemia: Secondary | ICD-10-CM | POA: Diagnosis not present

## 2020-03-03 DIAGNOSIS — K3189 Other diseases of stomach and duodenum: Secondary | ICD-10-CM | POA: Diagnosis not present

## 2020-03-03 DIAGNOSIS — D6869 Other thrombophilia: Secondary | ICD-10-CM | POA: Diagnosis not present

## 2020-03-03 DIAGNOSIS — R2681 Unsteadiness on feet: Secondary | ICD-10-CM | POA: Diagnosis not present

## 2020-03-03 DIAGNOSIS — J358 Other chronic diseases of tonsils and adenoids: Secondary | ICD-10-CM | POA: Diagnosis not present

## 2020-03-03 DIAGNOSIS — L821 Other seborrheic keratosis: Secondary | ICD-10-CM | POA: Diagnosis not present

## 2020-03-03 DIAGNOSIS — R946 Abnormal results of thyroid function studies: Secondary | ICD-10-CM | POA: Diagnosis not present

## 2020-03-03 DIAGNOSIS — Z7982 Long term (current) use of aspirin: Secondary | ICD-10-CM | POA: Diagnosis not present

## 2020-03-03 DIAGNOSIS — M79669 Pain in unspecified lower leg: Secondary | ICD-10-CM | POA: Diagnosis not present

## 2020-03-03 DIAGNOSIS — M1611 Unilateral primary osteoarthritis, right hip: Secondary | ICD-10-CM | POA: Diagnosis not present

## 2020-03-03 DIAGNOSIS — K633 Ulcer of intestine: Secondary | ICD-10-CM | POA: Diagnosis not present

## 2020-03-03 DIAGNOSIS — D519 Vitamin B12 deficiency anemia, unspecified: Secondary | ICD-10-CM | POA: Diagnosis not present

## 2020-03-03 DIAGNOSIS — T148XXA Other injury of unspecified body region, initial encounter: Secondary | ICD-10-CM | POA: Diagnosis not present

## 2020-03-03 DIAGNOSIS — K7689 Other specified diseases of liver: Secondary | ICD-10-CM | POA: Diagnosis not present

## 2020-03-03 DIAGNOSIS — I080 Rheumatic disorders of both mitral and aortic valves: Secondary | ICD-10-CM | POA: Diagnosis not present

## 2020-03-03 DIAGNOSIS — J8410 Pulmonary fibrosis, unspecified: Secondary | ICD-10-CM | POA: Diagnosis not present

## 2020-03-03 DIAGNOSIS — E1142 Type 2 diabetes mellitus with diabetic polyneuropathy: Secondary | ICD-10-CM | POA: Diagnosis not present

## 2020-03-03 DIAGNOSIS — Z1329 Encounter for screening for other suspected endocrine disorder: Secondary | ICD-10-CM | POA: Diagnosis not present

## 2020-03-03 DIAGNOSIS — L570 Actinic keratosis: Secondary | ICD-10-CM | POA: Diagnosis not present

## 2020-03-03 DIAGNOSIS — R32 Unspecified urinary incontinence: Secondary | ICD-10-CM | POA: Diagnosis not present

## 2020-03-03 DIAGNOSIS — C7951 Secondary malignant neoplasm of bone: Secondary | ICD-10-CM | POA: Diagnosis not present

## 2020-03-03 DIAGNOSIS — L03116 Cellulitis of left lower limb: Secondary | ICD-10-CM | POA: Diagnosis not present

## 2020-03-03 DIAGNOSIS — Z9884 Bariatric surgery status: Secondary | ICD-10-CM | POA: Diagnosis not present

## 2020-03-03 DIAGNOSIS — R0689 Other abnormalities of breathing: Secondary | ICD-10-CM | POA: Diagnosis not present

## 2020-03-03 DIAGNOSIS — C44622 Squamous cell carcinoma of skin of right upper limb, including shoulder: Secondary | ICD-10-CM | POA: Diagnosis not present

## 2020-03-03 DIAGNOSIS — I8393 Asymptomatic varicose veins of bilateral lower extremities: Secondary | ICD-10-CM | POA: Diagnosis not present

## 2020-03-03 DIAGNOSIS — M7120 Synovial cyst of popliteal space [Baker], unspecified knee: Secondary | ICD-10-CM | POA: Diagnosis not present

## 2020-03-03 DIAGNOSIS — Z111 Encounter for screening for respiratory tuberculosis: Secondary | ICD-10-CM | POA: Diagnosis not present

## 2020-03-03 DIAGNOSIS — R188 Other ascites: Secondary | ICD-10-CM | POA: Diagnosis not present

## 2020-03-03 DIAGNOSIS — G903 Multi-system degeneration of the autonomic nervous system: Secondary | ICD-10-CM | POA: Diagnosis not present

## 2020-03-03 DIAGNOSIS — Z9012 Acquired absence of left breast and nipple: Secondary | ICD-10-CM | POA: Diagnosis not present

## 2020-03-03 DIAGNOSIS — R809 Proteinuria, unspecified: Secondary | ICD-10-CM | POA: Diagnosis not present

## 2020-03-03 DIAGNOSIS — Z85118 Personal history of other malignant neoplasm of bronchus and lung: Secondary | ICD-10-CM | POA: Diagnosis not present

## 2020-03-03 DIAGNOSIS — G20 Parkinson's disease: Secondary | ICD-10-CM | POA: Diagnosis not present

## 2020-03-03 DIAGNOSIS — M25472 Effusion, left ankle: Secondary | ICD-10-CM | POA: Diagnosis not present

## 2020-03-03 DIAGNOSIS — E860 Dehydration: Secondary | ICD-10-CM | POA: Diagnosis not present

## 2020-03-03 DIAGNOSIS — H401123 Primary open-angle glaucoma, left eye, severe stage: Secondary | ICD-10-CM | POA: Diagnosis not present

## 2020-03-03 DIAGNOSIS — J329 Chronic sinusitis, unspecified: Secondary | ICD-10-CM | POA: Diagnosis not present

## 2020-03-03 DIAGNOSIS — Z4789 Encounter for other orthopedic aftercare: Secondary | ICD-10-CM | POA: Diagnosis not present

## 2020-03-03 DIAGNOSIS — K621 Rectal polyp: Secondary | ICD-10-CM | POA: Diagnosis not present

## 2020-03-03 DIAGNOSIS — J9801 Acute bronchospasm: Secondary | ICD-10-CM | POA: Diagnosis not present

## 2020-03-03 DIAGNOSIS — Z6838 Body mass index (BMI) 38.0-38.9, adult: Secondary | ICD-10-CM | POA: Diagnosis not present

## 2020-03-03 DIAGNOSIS — D351 Benign neoplasm of parathyroid gland: Secondary | ICD-10-CM | POA: Diagnosis not present

## 2020-03-03 DIAGNOSIS — Z1211 Encounter for screening for malignant neoplasm of colon: Secondary | ICD-10-CM | POA: Diagnosis not present

## 2020-03-03 DIAGNOSIS — R350 Frequency of micturition: Secondary | ICD-10-CM | POA: Diagnosis not present

## 2020-03-03 DIAGNOSIS — S0101XA Laceration without foreign body of scalp, initial encounter: Secondary | ICD-10-CM | POA: Diagnosis not present

## 2020-03-03 DIAGNOSIS — H6591 Unspecified nonsuppurative otitis media, right ear: Secondary | ICD-10-CM | POA: Diagnosis not present

## 2020-03-03 DIAGNOSIS — Z9189 Other specified personal risk factors, not elsewhere classified: Secondary | ICD-10-CM | POA: Diagnosis not present

## 2020-03-03 DIAGNOSIS — R0789 Other chest pain: Secondary | ICD-10-CM | POA: Diagnosis not present

## 2020-03-03 DIAGNOSIS — N179 Acute kidney failure, unspecified: Secondary | ICD-10-CM | POA: Diagnosis not present

## 2020-03-03 DIAGNOSIS — B070 Plantar wart: Secondary | ICD-10-CM | POA: Diagnosis not present

## 2020-03-03 DIAGNOSIS — S80219A Abrasion, unspecified knee, initial encounter: Secondary | ICD-10-CM | POA: Diagnosis not present

## 2020-03-03 DIAGNOSIS — B373 Candidiasis of vulva and vagina: Secondary | ICD-10-CM | POA: Diagnosis not present

## 2020-03-03 DIAGNOSIS — Z794 Long term (current) use of insulin: Secondary | ICD-10-CM | POA: Diagnosis not present

## 2020-03-03 DIAGNOSIS — Z981 Arthrodesis status: Secondary | ICD-10-CM | POA: Diagnosis not present

## 2020-03-03 DIAGNOSIS — J9611 Chronic respiratory failure with hypoxia: Secondary | ICD-10-CM | POA: Diagnosis not present

## 2020-03-03 DIAGNOSIS — R1012 Left upper quadrant pain: Secondary | ICD-10-CM | POA: Diagnosis not present

## 2020-03-03 DIAGNOSIS — R55 Syncope and collapse: Secondary | ICD-10-CM | POA: Diagnosis not present

## 2020-03-03 DIAGNOSIS — M7751 Other enthesopathy of right foot: Secondary | ICD-10-CM | POA: Diagnosis not present

## 2020-03-03 DIAGNOSIS — S22000A Wedge compression fracture of unspecified thoracic vertebra, initial encounter for closed fracture: Secondary | ICD-10-CM | POA: Diagnosis not present

## 2020-03-03 DIAGNOSIS — N6310 Unspecified lump in the right breast, unspecified quadrant: Secondary | ICD-10-CM | POA: Diagnosis not present

## 2020-03-03 DIAGNOSIS — H40003 Preglaucoma, unspecified, bilateral: Secondary | ICD-10-CM | POA: Diagnosis not present

## 2020-03-03 DIAGNOSIS — C482 Malignant neoplasm of peritoneum, unspecified: Secondary | ICD-10-CM | POA: Diagnosis not present

## 2020-03-03 DIAGNOSIS — Z6823 Body mass index (BMI) 23.0-23.9, adult: Secondary | ICD-10-CM | POA: Diagnosis not present

## 2020-03-03 DIAGNOSIS — L719 Rosacea, unspecified: Secondary | ICD-10-CM | POA: Diagnosis not present

## 2020-03-03 DIAGNOSIS — M47812 Spondylosis without myelopathy or radiculopathy, cervical region: Secondary | ICD-10-CM | POA: Diagnosis not present

## 2020-03-03 DIAGNOSIS — C672 Malignant neoplasm of lateral wall of bladder: Secondary | ICD-10-CM | POA: Diagnosis not present

## 2020-03-03 DIAGNOSIS — R21 Rash and other nonspecific skin eruption: Secondary | ICD-10-CM | POA: Diagnosis not present

## 2020-03-03 DIAGNOSIS — F17200 Nicotine dependence, unspecified, uncomplicated: Secondary | ICD-10-CM | POA: Diagnosis not present

## 2020-03-03 DIAGNOSIS — R634 Abnormal weight loss: Secondary | ICD-10-CM | POA: Diagnosis not present

## 2020-03-03 DIAGNOSIS — S32511A Fracture of superior rim of right pubis, initial encounter for closed fracture: Secondary | ICD-10-CM | POA: Diagnosis not present

## 2020-03-03 DIAGNOSIS — W1842XA Slipping, tripping and stumbling without falling due to stepping into hole or opening, initial encounter: Secondary | ICD-10-CM | POA: Diagnosis not present

## 2020-03-03 DIAGNOSIS — Z803 Family history of malignant neoplasm of breast: Secondary | ICD-10-CM | POA: Diagnosis not present

## 2020-03-03 DIAGNOSIS — I351 Nonrheumatic aortic (valve) insufficiency: Secondary | ICD-10-CM | POA: Diagnosis not present

## 2020-03-03 DIAGNOSIS — Z13220 Encounter for screening for lipoid disorders: Secondary | ICD-10-CM | POA: Diagnosis not present

## 2020-03-03 DIAGNOSIS — H35373 Puckering of macula, bilateral: Secondary | ICD-10-CM | POA: Diagnosis not present

## 2020-03-03 DIAGNOSIS — S79929A Unspecified injury of unspecified thigh, initial encounter: Secondary | ICD-10-CM | POA: Diagnosis not present

## 2020-03-03 DIAGNOSIS — K754 Autoimmune hepatitis: Secondary | ICD-10-CM | POA: Diagnosis not present

## 2020-03-03 DIAGNOSIS — F319 Bipolar disorder, unspecified: Secondary | ICD-10-CM | POA: Diagnosis not present

## 2020-03-03 DIAGNOSIS — M7582 Other shoulder lesions, left shoulder: Secondary | ICD-10-CM | POA: Diagnosis not present

## 2020-03-03 DIAGNOSIS — N2581 Secondary hyperparathyroidism of renal origin: Secondary | ICD-10-CM | POA: Diagnosis not present

## 2020-03-03 DIAGNOSIS — I059 Rheumatic mitral valve disease, unspecified: Secondary | ICD-10-CM | POA: Diagnosis not present

## 2020-03-03 DIAGNOSIS — F321 Major depressive disorder, single episode, moderate: Secondary | ICD-10-CM | POA: Diagnosis not present

## 2020-03-03 DIAGNOSIS — S79911A Unspecified injury of right hip, initial encounter: Secondary | ICD-10-CM | POA: Diagnosis not present

## 2020-03-03 DIAGNOSIS — M19041 Primary osteoarthritis, right hand: Secondary | ICD-10-CM | POA: Diagnosis not present

## 2020-03-03 DIAGNOSIS — D3A010 Benign carcinoid tumor of the duodenum: Secondary | ICD-10-CM | POA: Diagnosis not present

## 2020-03-03 DIAGNOSIS — R7989 Other specified abnormal findings of blood chemistry: Secondary | ICD-10-CM | POA: Diagnosis not present

## 2020-03-03 DIAGNOSIS — M238X1 Other internal derangements of right knee: Secondary | ICD-10-CM | POA: Diagnosis not present

## 2020-03-03 DIAGNOSIS — Z89431 Acquired absence of right foot: Secondary | ICD-10-CM | POA: Diagnosis not present

## 2020-03-03 DIAGNOSIS — R627 Adult failure to thrive: Secondary | ICD-10-CM | POA: Diagnosis not present

## 2020-03-03 DIAGNOSIS — D225 Melanocytic nevi of trunk: Secondary | ICD-10-CM | POA: Diagnosis not present

## 2020-03-03 DIAGNOSIS — M25811 Other specified joint disorders, right shoulder: Secondary | ICD-10-CM | POA: Diagnosis not present

## 2020-03-03 DIAGNOSIS — M8610 Other acute osteomyelitis, unspecified site: Secondary | ICD-10-CM | POA: Diagnosis not present

## 2020-03-03 DIAGNOSIS — D1800 Hemangioma unspecified site: Secondary | ICD-10-CM | POA: Diagnosis not present

## 2020-03-03 DIAGNOSIS — R972 Elevated prostate specific antigen [PSA]: Secondary | ICD-10-CM | POA: Diagnosis not present

## 2020-03-03 DIAGNOSIS — F5221 Male erectile disorder: Secondary | ICD-10-CM | POA: Diagnosis not present

## 2020-03-03 DIAGNOSIS — E559 Vitamin D deficiency, unspecified: Secondary | ICD-10-CM | POA: Diagnosis not present

## 2020-03-03 DIAGNOSIS — I83891 Varicose veins of right lower extremities with other complications: Secondary | ICD-10-CM | POA: Diagnosis not present

## 2020-03-03 DIAGNOSIS — F341 Dysthymic disorder: Secondary | ICD-10-CM | POA: Diagnosis not present

## 2020-03-03 DIAGNOSIS — J9610 Chronic respiratory failure, unspecified whether with hypoxia or hypercapnia: Secondary | ICD-10-CM | POA: Diagnosis not present

## 2020-03-03 DIAGNOSIS — F0390 Unspecified dementia without behavioral disturbance: Secondary | ICD-10-CM | POA: Diagnosis not present

## 2020-03-03 DIAGNOSIS — L282 Other prurigo: Secondary | ICD-10-CM | POA: Diagnosis not present

## 2020-03-03 DIAGNOSIS — R1010 Upper abdominal pain, unspecified: Secondary | ICD-10-CM | POA: Diagnosis not present

## 2020-03-03 DIAGNOSIS — M48061 Spinal stenosis, lumbar region without neurogenic claudication: Secondary | ICD-10-CM | POA: Diagnosis not present

## 2020-03-03 DIAGNOSIS — R310 Gross hematuria: Secondary | ICD-10-CM | POA: Diagnosis not present

## 2020-03-03 DIAGNOSIS — F0280 Dementia in other diseases classified elsewhere without behavioral disturbance: Secondary | ICD-10-CM | POA: Diagnosis not present

## 2020-03-03 DIAGNOSIS — R030 Elevated blood-pressure reading, without diagnosis of hypertension: Secondary | ICD-10-CM | POA: Diagnosis not present

## 2020-03-03 DIAGNOSIS — E7800 Pure hypercholesterolemia, unspecified: Secondary | ICD-10-CM | POA: Diagnosis not present

## 2020-03-03 DIAGNOSIS — Z6837 Body mass index (BMI) 37.0-37.9, adult: Secondary | ICD-10-CM | POA: Diagnosis not present

## 2020-03-03 DIAGNOSIS — H35433 Paving stone degeneration of retina, bilateral: Secondary | ICD-10-CM | POA: Diagnosis not present

## 2020-03-03 DIAGNOSIS — C189 Malignant neoplasm of colon, unspecified: Secondary | ICD-10-CM | POA: Diagnosis not present

## 2020-03-03 DIAGNOSIS — Z79899 Other long term (current) drug therapy: Secondary | ICD-10-CM | POA: Diagnosis not present

## 2020-03-03 DIAGNOSIS — N528 Other male erectile dysfunction: Secondary | ICD-10-CM | POA: Diagnosis not present

## 2020-03-03 DIAGNOSIS — G609 Hereditary and idiopathic neuropathy, unspecified: Secondary | ICD-10-CM | POA: Diagnosis not present

## 2020-03-03 DIAGNOSIS — R12 Heartburn: Secondary | ICD-10-CM | POA: Diagnosis not present

## 2020-03-03 DIAGNOSIS — Z993 Dependence on wheelchair: Secondary | ICD-10-CM | POA: Diagnosis not present

## 2020-03-03 DIAGNOSIS — D539 Nutritional anemia, unspecified: Secondary | ICD-10-CM | POA: Diagnosis not present

## 2020-03-03 DIAGNOSIS — Z9862 Peripheral vascular angioplasty status: Secondary | ICD-10-CM | POA: Diagnosis not present

## 2020-03-03 DIAGNOSIS — R0683 Snoring: Secondary | ICD-10-CM | POA: Diagnosis not present

## 2020-03-03 DIAGNOSIS — C884 Extranodal marginal zone B-cell lymphoma of mucosa-associated lymphoid tissue [MALT-lymphoma]: Secondary | ICD-10-CM | POA: Diagnosis not present

## 2020-03-03 DIAGNOSIS — R3129 Other microscopic hematuria: Secondary | ICD-10-CM | POA: Diagnosis not present

## 2020-03-03 DIAGNOSIS — D510 Vitamin B12 deficiency anemia due to intrinsic factor deficiency: Secondary | ICD-10-CM | POA: Diagnosis not present

## 2020-03-03 DIAGNOSIS — E118 Type 2 diabetes mellitus with unspecified complications: Secondary | ICD-10-CM | POA: Diagnosis not present

## 2020-03-03 DIAGNOSIS — Z6825 Body mass index (BMI) 25.0-25.9, adult: Secondary | ICD-10-CM | POA: Diagnosis not present

## 2020-03-03 DIAGNOSIS — F4320 Adjustment disorder, unspecified: Secondary | ICD-10-CM | POA: Diagnosis not present

## 2020-03-03 DIAGNOSIS — M9904 Segmental and somatic dysfunction of sacral region: Secondary | ICD-10-CM | POA: Diagnosis not present

## 2020-03-03 DIAGNOSIS — S32591A Other specified fracture of right pubis, initial encounter for closed fracture: Secondary | ICD-10-CM | POA: Diagnosis not present

## 2020-03-03 DIAGNOSIS — H40053 Ocular hypertension, bilateral: Secondary | ICD-10-CM | POA: Diagnosis not present

## 2020-03-03 DIAGNOSIS — D509 Iron deficiency anemia, unspecified: Secondary | ICD-10-CM | POA: Diagnosis not present

## 2020-03-03 DIAGNOSIS — R0609 Other forms of dyspnea: Secondary | ICD-10-CM | POA: Diagnosis not present

## 2020-03-03 DIAGNOSIS — H16292 Other keratoconjunctivitis, left eye: Secondary | ICD-10-CM | POA: Diagnosis not present

## 2020-03-03 DIAGNOSIS — I4821 Permanent atrial fibrillation: Secondary | ICD-10-CM | POA: Diagnosis not present

## 2020-03-03 DIAGNOSIS — M4186 Other forms of scoliosis, lumbar region: Secondary | ICD-10-CM | POA: Diagnosis not present

## 2020-03-03 DIAGNOSIS — M25562 Pain in left knee: Secondary | ICD-10-CM | POA: Diagnosis not present

## 2020-03-03 DIAGNOSIS — D696 Thrombocytopenia, unspecified: Secondary | ICD-10-CM | POA: Diagnosis not present

## 2020-03-03 DIAGNOSIS — Z6827 Body mass index (BMI) 27.0-27.9, adult: Secondary | ICD-10-CM | POA: Diagnosis not present

## 2020-03-03 DIAGNOSIS — E034 Atrophy of thyroid (acquired): Secondary | ICD-10-CM | POA: Diagnosis not present

## 2020-03-03 DIAGNOSIS — M129 Arthropathy, unspecified: Secondary | ICD-10-CM | POA: Diagnosis not present

## 2020-03-03 DIAGNOSIS — R10813 Right lower quadrant abdominal tenderness: Secondary | ICD-10-CM | POA: Diagnosis not present

## 2020-03-03 DIAGNOSIS — C9140 Hairy cell leukemia not having achieved remission: Secondary | ICD-10-CM | POA: Diagnosis not present

## 2020-03-03 DIAGNOSIS — I5023 Acute on chronic systolic (congestive) heart failure: Secondary | ICD-10-CM | POA: Diagnosis not present

## 2020-03-03 DIAGNOSIS — A0472 Enterocolitis due to Clostridium difficile, not specified as recurrent: Secondary | ICD-10-CM | POA: Diagnosis not present

## 2020-03-03 DIAGNOSIS — J45901 Unspecified asthma with (acute) exacerbation: Secondary | ICD-10-CM | POA: Diagnosis not present

## 2020-03-03 DIAGNOSIS — D62 Acute posthemorrhagic anemia: Secondary | ICD-10-CM | POA: Diagnosis not present

## 2020-03-03 DIAGNOSIS — H40033 Anatomical narrow angle, bilateral: Secondary | ICD-10-CM | POA: Diagnosis not present

## 2020-03-03 DIAGNOSIS — D1801 Hemangioma of skin and subcutaneous tissue: Secondary | ICD-10-CM | POA: Diagnosis not present

## 2020-03-03 DIAGNOSIS — H54415A Blindness right eye category 5, normal vision left eye: Secondary | ICD-10-CM | POA: Diagnosis not present

## 2020-03-03 DIAGNOSIS — M5126 Other intervertebral disc displacement, lumbar region: Secondary | ICD-10-CM | POA: Diagnosis not present

## 2020-03-03 DIAGNOSIS — Z86711 Personal history of pulmonary embolism: Secondary | ICD-10-CM | POA: Diagnosis not present

## 2020-03-03 DIAGNOSIS — M792 Neuralgia and neuritis, unspecified: Secondary | ICD-10-CM | POA: Diagnosis not present

## 2020-03-03 DIAGNOSIS — H6982 Other specified disorders of Eustachian tube, left ear: Secondary | ICD-10-CM | POA: Diagnosis not present

## 2020-03-03 DIAGNOSIS — D169 Benign neoplasm of bone and articular cartilage, unspecified: Secondary | ICD-10-CM | POA: Diagnosis not present

## 2020-03-03 DIAGNOSIS — Z8546 Personal history of malignant neoplasm of prostate: Secondary | ICD-10-CM | POA: Diagnosis not present

## 2020-03-03 DIAGNOSIS — E041 Nontoxic single thyroid nodule: Secondary | ICD-10-CM | POA: Diagnosis not present

## 2020-03-03 DIAGNOSIS — Z91030 Bee allergy status: Secondary | ICD-10-CM | POA: Diagnosis not present

## 2020-03-03 DIAGNOSIS — R234 Changes in skin texture: Secondary | ICD-10-CM | POA: Diagnosis not present

## 2020-03-03 DIAGNOSIS — K2270 Barrett's esophagus without dysplasia: Secondary | ICD-10-CM | POA: Diagnosis not present

## 2020-03-03 DIAGNOSIS — M3501 Sicca syndrome with keratoconjunctivitis: Secondary | ICD-10-CM | POA: Diagnosis not present

## 2020-03-03 DIAGNOSIS — M8949 Other hypertrophic osteoarthropathy, multiple sites: Secondary | ICD-10-CM | POA: Diagnosis not present

## 2020-03-03 DIAGNOSIS — E43 Unspecified severe protein-calorie malnutrition: Secondary | ICD-10-CM | POA: Diagnosis not present

## 2020-03-03 DIAGNOSIS — R29818 Other symptoms and signs involving the nervous system: Secondary | ICD-10-CM | POA: Diagnosis not present

## 2020-03-03 DIAGNOSIS — H16001 Unspecified corneal ulcer, right eye: Secondary | ICD-10-CM | POA: Diagnosis not present

## 2020-03-03 DIAGNOSIS — N904 Leukoplakia of vulva: Secondary | ICD-10-CM | POA: Diagnosis not present

## 2020-03-03 DIAGNOSIS — I700 Atherosclerosis of aorta: Secondary | ICD-10-CM | POA: Diagnosis not present

## 2020-03-03 DIAGNOSIS — H34832 Tributary (branch) retinal vein occlusion, left eye, with macular edema: Secondary | ICD-10-CM | POA: Diagnosis not present

## 2020-03-03 DIAGNOSIS — G3184 Mild cognitive impairment, so stated: Secondary | ICD-10-CM | POA: Diagnosis not present

## 2020-03-03 DIAGNOSIS — M25561 Pain in right knee: Secondary | ICD-10-CM | POA: Diagnosis not present

## 2020-03-03 DIAGNOSIS — E291 Testicular hypofunction: Secondary | ICD-10-CM | POA: Diagnosis not present

## 2020-03-03 DIAGNOSIS — Z96612 Presence of left artificial shoulder joint: Secondary | ICD-10-CM | POA: Diagnosis not present

## 2020-03-03 DIAGNOSIS — S81801D Unspecified open wound, right lower leg, subsequent encounter: Secondary | ICD-10-CM | POA: Diagnosis not present

## 2020-03-03 DIAGNOSIS — C73 Malignant neoplasm of thyroid gland: Secondary | ICD-10-CM | POA: Diagnosis not present

## 2020-03-03 DIAGNOSIS — Z974 Presence of external hearing-aid: Secondary | ICD-10-CM | POA: Diagnosis not present

## 2020-03-03 DIAGNOSIS — I7300 Raynaud's syndrome without gangrene: Secondary | ICD-10-CM | POA: Diagnosis not present

## 2020-03-03 DIAGNOSIS — Z95820 Peripheral vascular angioplasty status with implants and grafts: Secondary | ICD-10-CM | POA: Diagnosis not present

## 2020-03-03 DIAGNOSIS — H00014 Hordeolum externum left upper eyelid: Secondary | ICD-10-CM | POA: Diagnosis not present

## 2020-03-03 DIAGNOSIS — M868X8 Other osteomyelitis, other site: Secondary | ICD-10-CM | POA: Diagnosis not present

## 2020-03-03 DIAGNOSIS — M79671 Pain in right foot: Secondary | ICD-10-CM | POA: Diagnosis not present

## 2020-03-03 DIAGNOSIS — M79675 Pain in left toe(s): Secondary | ICD-10-CM | POA: Diagnosis not present

## 2020-03-03 DIAGNOSIS — R279 Unspecified lack of coordination: Secondary | ICD-10-CM | POA: Diagnosis not present

## 2020-03-03 DIAGNOSIS — R748 Abnormal levels of other serum enzymes: Secondary | ICD-10-CM | POA: Diagnosis not present

## 2020-03-03 DIAGNOSIS — S8992XA Unspecified injury of left lower leg, initial encounter: Secondary | ICD-10-CM | POA: Diagnosis not present

## 2020-03-03 DIAGNOSIS — Z5111 Encounter for antineoplastic chemotherapy: Secondary | ICD-10-CM | POA: Diagnosis not present

## 2020-03-03 DIAGNOSIS — J309 Allergic rhinitis, unspecified: Secondary | ICD-10-CM | POA: Diagnosis not present

## 2020-03-03 DIAGNOSIS — R7401 Elevation of levels of liver transaminase levels: Secondary | ICD-10-CM | POA: Diagnosis not present

## 2020-03-03 DIAGNOSIS — R42 Dizziness and giddiness: Secondary | ICD-10-CM | POA: Diagnosis not present

## 2020-03-03 DIAGNOSIS — R739 Hyperglycemia, unspecified: Secondary | ICD-10-CM | POA: Diagnosis not present

## 2020-03-03 DIAGNOSIS — M19079 Primary osteoarthritis, unspecified ankle and foot: Secondary | ICD-10-CM | POA: Diagnosis not present

## 2020-03-03 DIAGNOSIS — Z91048 Other nonmedicinal substance allergy status: Secondary | ICD-10-CM | POA: Diagnosis not present

## 2020-03-03 DIAGNOSIS — M79645 Pain in left finger(s): Secondary | ICD-10-CM | POA: Diagnosis not present

## 2020-03-03 DIAGNOSIS — M25461 Effusion, right knee: Secondary | ICD-10-CM | POA: Diagnosis not present

## 2020-03-03 DIAGNOSIS — L97511 Non-pressure chronic ulcer of other part of right foot limited to breakdown of skin: Secondary | ICD-10-CM | POA: Diagnosis not present

## 2020-03-03 DIAGNOSIS — L578 Other skin changes due to chronic exposure to nonionizing radiation: Secondary | ICD-10-CM | POA: Diagnosis not present

## 2020-03-03 DIAGNOSIS — Z510 Encounter for antineoplastic radiation therapy: Secondary | ICD-10-CM | POA: Diagnosis not present

## 2020-03-03 DIAGNOSIS — M359 Systemic involvement of connective tissue, unspecified: Secondary | ICD-10-CM | POA: Diagnosis not present

## 2020-03-03 DIAGNOSIS — I4892 Unspecified atrial flutter: Secondary | ICD-10-CM | POA: Diagnosis not present

## 2020-03-03 DIAGNOSIS — R928 Other abnormal and inconclusive findings on diagnostic imaging of breast: Secondary | ICD-10-CM | POA: Diagnosis not present

## 2020-03-03 DIAGNOSIS — J4540 Moderate persistent asthma, uncomplicated: Secondary | ICD-10-CM | POA: Diagnosis not present

## 2020-03-03 DIAGNOSIS — M5442 Lumbago with sciatica, left side: Secondary | ICD-10-CM | POA: Diagnosis not present

## 2020-03-03 DIAGNOSIS — H6990 Unspecified Eustachian tube disorder, unspecified ear: Secondary | ICD-10-CM | POA: Diagnosis not present

## 2020-03-03 DIAGNOSIS — Z96642 Presence of left artificial hip joint: Secondary | ICD-10-CM | POA: Diagnosis not present

## 2020-03-03 DIAGNOSIS — I0981 Rheumatic heart failure: Secondary | ICD-10-CM | POA: Diagnosis not present

## 2020-03-03 DIAGNOSIS — Z6835 Body mass index (BMI) 35.0-35.9, adult: Secondary | ICD-10-CM | POA: Diagnosis not present

## 2020-03-03 DIAGNOSIS — C786 Secondary malignant neoplasm of retroperitoneum and peritoneum: Secondary | ICD-10-CM | POA: Diagnosis not present

## 2020-03-03 DIAGNOSIS — Z7951 Long term (current) use of inhaled steroids: Secondary | ICD-10-CM | POA: Diagnosis not present

## 2020-03-03 DIAGNOSIS — H401131 Primary open-angle glaucoma, bilateral, mild stage: Secondary | ICD-10-CM | POA: Diagnosis not present

## 2020-03-03 DIAGNOSIS — Z66 Do not resuscitate: Secondary | ICD-10-CM | POA: Diagnosis not present

## 2020-03-03 DIAGNOSIS — S8012XA Contusion of left lower leg, initial encounter: Secondary | ICD-10-CM | POA: Diagnosis not present

## 2020-03-03 DIAGNOSIS — M109 Gout, unspecified: Secondary | ICD-10-CM | POA: Diagnosis not present

## 2020-03-03 DIAGNOSIS — F339 Major depressive disorder, recurrent, unspecified: Secondary | ICD-10-CM | POA: Diagnosis not present

## 2020-03-03 DIAGNOSIS — L97521 Non-pressure chronic ulcer of other part of left foot limited to breakdown of skin: Secondary | ICD-10-CM | POA: Diagnosis not present

## 2020-03-03 DIAGNOSIS — Z90722 Acquired absence of ovaries, bilateral: Secondary | ICD-10-CM | POA: Diagnosis not present

## 2020-03-03 DIAGNOSIS — E1129 Type 2 diabetes mellitus with other diabetic kidney complication: Secondary | ICD-10-CM | POA: Diagnosis not present

## 2020-03-03 DIAGNOSIS — H20022 Recurrent acute iridocyclitis, left eye: Secondary | ICD-10-CM | POA: Diagnosis not present

## 2020-03-03 DIAGNOSIS — Z6829 Body mass index (BMI) 29.0-29.9, adult: Secondary | ICD-10-CM | POA: Diagnosis not present

## 2020-03-03 DIAGNOSIS — M549 Dorsalgia, unspecified: Secondary | ICD-10-CM | POA: Diagnosis not present

## 2020-03-03 DIAGNOSIS — R200 Anesthesia of skin: Secondary | ICD-10-CM | POA: Diagnosis not present

## 2020-03-03 DIAGNOSIS — Z992 Dependence on renal dialysis: Secondary | ICD-10-CM | POA: Diagnosis not present

## 2020-03-03 DIAGNOSIS — R112 Nausea with vomiting, unspecified: Secondary | ICD-10-CM | POA: Diagnosis not present

## 2020-03-03 DIAGNOSIS — E210 Primary hyperparathyroidism: Secondary | ICD-10-CM | POA: Diagnosis not present

## 2020-03-03 DIAGNOSIS — C3490 Malignant neoplasm of unspecified part of unspecified bronchus or lung: Secondary | ICD-10-CM | POA: Diagnosis not present

## 2020-03-03 DIAGNOSIS — J40 Bronchitis, not specified as acute or chronic: Secondary | ICD-10-CM | POA: Diagnosis not present

## 2020-03-03 DIAGNOSIS — M6281 Muscle weakness (generalized): Secondary | ICD-10-CM | POA: Diagnosis not present

## 2020-03-03 DIAGNOSIS — R252 Cramp and spasm: Secondary | ICD-10-CM | POA: Diagnosis not present

## 2020-03-03 DIAGNOSIS — I4811 Longstanding persistent atrial fibrillation: Secondary | ICD-10-CM | POA: Diagnosis not present

## 2020-03-03 DIAGNOSIS — M654 Radial styloid tenosynovitis [de Quervain]: Secondary | ICD-10-CM | POA: Diagnosis not present

## 2020-03-03 DIAGNOSIS — Z8739 Personal history of other diseases of the musculoskeletal system and connective tissue: Secondary | ICD-10-CM | POA: Diagnosis not present

## 2020-03-03 DIAGNOSIS — Z7983 Long term (current) use of bisphosphonates: Secondary | ICD-10-CM | POA: Diagnosis not present

## 2020-03-03 DIAGNOSIS — K4130 Unilateral femoral hernia, with obstruction, without gangrene, not specified as recurrent: Secondary | ICD-10-CM | POA: Diagnosis not present

## 2020-03-03 DIAGNOSIS — N138 Other obstructive and reflux uropathy: Secondary | ICD-10-CM | POA: Diagnosis not present

## 2020-03-03 DIAGNOSIS — I959 Hypotension, unspecified: Secondary | ICD-10-CM | POA: Diagnosis not present

## 2020-03-03 DIAGNOSIS — Z1283 Encounter for screening for malignant neoplasm of skin: Secondary | ICD-10-CM | POA: Diagnosis not present

## 2020-03-03 DIAGNOSIS — I213 ST elevation (STEMI) myocardial infarction of unspecified site: Secondary | ICD-10-CM | POA: Diagnosis not present

## 2020-03-03 DIAGNOSIS — I739 Peripheral vascular disease, unspecified: Secondary | ICD-10-CM | POA: Diagnosis not present

## 2020-03-03 DIAGNOSIS — N35914 Unspecified anterior urethral stricture, male: Secondary | ICD-10-CM | POA: Diagnosis not present

## 2020-03-03 DIAGNOSIS — R519 Headache, unspecified: Secondary | ICD-10-CM | POA: Diagnosis not present

## 2020-03-03 DIAGNOSIS — R61 Generalized hyperhidrosis: Secondary | ICD-10-CM | POA: Diagnosis not present

## 2020-03-03 DIAGNOSIS — H34831 Tributary (branch) retinal vein occlusion, right eye, with macular edema: Secondary | ICD-10-CM | POA: Diagnosis not present

## 2020-03-03 DIAGNOSIS — D518 Other vitamin B12 deficiency anemias: Secondary | ICD-10-CM | POA: Diagnosis not present

## 2020-03-03 DIAGNOSIS — Z881 Allergy status to other antibiotic agents status: Secondary | ICD-10-CM | POA: Diagnosis not present

## 2020-03-03 DIAGNOSIS — R161 Splenomegaly, not elsewhere classified: Secondary | ICD-10-CM | POA: Diagnosis not present

## 2020-03-03 DIAGNOSIS — I509 Heart failure, unspecified: Secondary | ICD-10-CM | POA: Diagnosis not present

## 2020-03-03 DIAGNOSIS — Z136 Encounter for screening for cardiovascular disorders: Secondary | ICD-10-CM | POA: Diagnosis not present

## 2020-03-03 DIAGNOSIS — H833X3 Noise effects on inner ear, bilateral: Secondary | ICD-10-CM | POA: Diagnosis not present

## 2020-03-03 DIAGNOSIS — E1065 Type 1 diabetes mellitus with hyperglycemia: Secondary | ICD-10-CM | POA: Diagnosis not present

## 2020-03-03 DIAGNOSIS — R102 Pelvic and perineal pain: Secondary | ICD-10-CM | POA: Diagnosis not present

## 2020-03-03 DIAGNOSIS — F1011 Alcohol abuse, in remission: Secondary | ICD-10-CM | POA: Diagnosis not present

## 2020-03-03 DIAGNOSIS — R1013 Epigastric pain: Secondary | ICD-10-CM | POA: Diagnosis not present

## 2020-03-03 DIAGNOSIS — K228 Other specified diseases of esophagus: Secondary | ICD-10-CM | POA: Diagnosis not present

## 2020-03-03 DIAGNOSIS — H43813 Vitreous degeneration, bilateral: Secondary | ICD-10-CM | POA: Diagnosis not present

## 2020-03-03 DIAGNOSIS — H2512 Age-related nuclear cataract, left eye: Secondary | ICD-10-CM | POA: Diagnosis not present

## 2020-03-03 DIAGNOSIS — H0289 Other specified disorders of eyelid: Secondary | ICD-10-CM | POA: Diagnosis not present

## 2020-03-03 DIAGNOSIS — S62121A Displaced fracture of lunate [semilunar], right wrist, initial encounter for closed fracture: Secondary | ICD-10-CM | POA: Diagnosis not present

## 2020-03-03 DIAGNOSIS — I5033 Acute on chronic diastolic (congestive) heart failure: Secondary | ICD-10-CM | POA: Diagnosis not present

## 2020-03-03 DIAGNOSIS — R293 Abnormal posture: Secondary | ICD-10-CM | POA: Diagnosis not present

## 2020-03-03 DIAGNOSIS — D709 Neutropenia, unspecified: Secondary | ICD-10-CM | POA: Diagnosis not present

## 2020-03-03 DIAGNOSIS — R159 Full incontinence of feces: Secondary | ICD-10-CM | POA: Diagnosis not present

## 2020-03-03 DIAGNOSIS — D3610 Benign neoplasm of peripheral nerves and autonomic nervous system, unspecified: Secondary | ICD-10-CM | POA: Diagnosis not present

## 2020-03-03 DIAGNOSIS — R7301 Impaired fasting glucose: Secondary | ICD-10-CM | POA: Diagnosis not present

## 2020-03-03 DIAGNOSIS — Z79890 Hormone replacement therapy (postmenopausal): Secondary | ICD-10-CM | POA: Diagnosis not present

## 2020-03-03 DIAGNOSIS — Z01812 Encounter for preprocedural laboratory examination: Secondary | ICD-10-CM | POA: Diagnosis not present

## 2020-03-03 DIAGNOSIS — Z801 Family history of malignant neoplasm of trachea, bronchus and lung: Secondary | ICD-10-CM | POA: Diagnosis not present

## 2020-03-03 DIAGNOSIS — S199XXA Unspecified injury of neck, initial encounter: Secondary | ICD-10-CM | POA: Diagnosis not present

## 2020-03-03 DIAGNOSIS — Z23 Encounter for immunization: Secondary | ICD-10-CM | POA: Diagnosis not present

## 2020-03-03 DIAGNOSIS — S72141A Displaced intertrochanteric fracture of right femur, initial encounter for closed fracture: Secondary | ICD-10-CM | POA: Diagnosis not present

## 2020-03-03 DIAGNOSIS — Z131 Encounter for screening for diabetes mellitus: Secondary | ICD-10-CM | POA: Diagnosis not present

## 2020-03-03 DIAGNOSIS — I8310 Varicose veins of unspecified lower extremity with inflammation: Secondary | ICD-10-CM | POA: Diagnosis not present

## 2020-03-03 DIAGNOSIS — N3281 Overactive bladder: Secondary | ICD-10-CM | POA: Diagnosis not present

## 2020-03-03 DIAGNOSIS — M47896 Other spondylosis, lumbar region: Secondary | ICD-10-CM | POA: Diagnosis not present

## 2020-03-03 DIAGNOSIS — H6121 Impacted cerumen, right ear: Secondary | ICD-10-CM | POA: Diagnosis not present

## 2020-03-03 DIAGNOSIS — Z720 Tobacco use: Secondary | ICD-10-CM | POA: Diagnosis not present

## 2020-03-03 DIAGNOSIS — R29898 Other symptoms and signs involving the musculoskeletal system: Secondary | ICD-10-CM | POA: Diagnosis not present

## 2020-03-03 DIAGNOSIS — I2510 Atherosclerotic heart disease of native coronary artery without angina pectoris: Secondary | ICD-10-CM | POA: Diagnosis not present

## 2020-03-03 DIAGNOSIS — N189 Chronic kidney disease, unspecified: Secondary | ICD-10-CM | POA: Diagnosis not present

## 2020-03-03 DIAGNOSIS — M7712 Lateral epicondylitis, left elbow: Secondary | ICD-10-CM | POA: Diagnosis not present

## 2020-03-03 DIAGNOSIS — Z03818 Encounter for observation for suspected exposure to other biological agents ruled out: Secondary | ICD-10-CM | POA: Diagnosis not present

## 2020-03-03 DIAGNOSIS — M7742 Metatarsalgia, left foot: Secondary | ICD-10-CM | POA: Diagnosis not present

## 2020-03-03 DIAGNOSIS — M25612 Stiffness of left shoulder, not elsewhere classified: Secondary | ICD-10-CM | POA: Diagnosis not present

## 2020-03-03 DIAGNOSIS — Z09 Encounter for follow-up examination after completed treatment for conditions other than malignant neoplasm: Secondary | ICD-10-CM | POA: Diagnosis not present

## 2020-03-03 DIAGNOSIS — M069 Rheumatoid arthritis, unspecified: Secondary | ICD-10-CM | POA: Diagnosis not present

## 2020-03-03 DIAGNOSIS — J449 Chronic obstructive pulmonary disease, unspecified: Secondary | ICD-10-CM | POA: Diagnosis not present

## 2020-03-03 DIAGNOSIS — R1313 Dysphagia, pharyngeal phase: Secondary | ICD-10-CM | POA: Diagnosis not present

## 2020-03-03 DIAGNOSIS — I6523 Occlusion and stenosis of bilateral carotid arteries: Secondary | ICD-10-CM | POA: Diagnosis not present

## 2020-03-03 DIAGNOSIS — H906 Mixed conductive and sensorineural hearing loss, bilateral: Secondary | ICD-10-CM | POA: Diagnosis not present

## 2020-03-03 DIAGNOSIS — M7741 Metatarsalgia, right foot: Secondary | ICD-10-CM | POA: Diagnosis not present

## 2020-03-03 DIAGNOSIS — I421 Obstructive hypertrophic cardiomyopathy: Secondary | ICD-10-CM | POA: Diagnosis not present

## 2020-03-03 DIAGNOSIS — Z125 Encounter for screening for malignant neoplasm of prostate: Secondary | ICD-10-CM | POA: Diagnosis not present

## 2020-03-03 DIAGNOSIS — L02611 Cutaneous abscess of right foot: Secondary | ICD-10-CM | POA: Diagnosis not present

## 2020-03-03 DIAGNOSIS — I442 Atrioventricular block, complete: Secondary | ICD-10-CM | POA: Diagnosis not present

## 2020-03-03 DIAGNOSIS — I25110 Atherosclerotic heart disease of native coronary artery with unstable angina pectoris: Secondary | ICD-10-CM | POA: Diagnosis not present

## 2020-03-03 DIAGNOSIS — J4520 Mild intermittent asthma, uncomplicated: Secondary | ICD-10-CM | POA: Diagnosis not present

## 2020-03-03 DIAGNOSIS — Z792 Long term (current) use of antibiotics: Secondary | ICD-10-CM | POA: Diagnosis not present

## 2020-03-03 DIAGNOSIS — R52 Pain, unspecified: Secondary | ICD-10-CM | POA: Diagnosis not present

## 2020-03-03 DIAGNOSIS — M79641 Pain in right hand: Secondary | ICD-10-CM | POA: Diagnosis not present

## 2020-03-03 DIAGNOSIS — R7309 Other abnormal glucose: Secondary | ICD-10-CM | POA: Diagnosis not present

## 2020-03-03 DIAGNOSIS — R799 Abnormal finding of blood chemistry, unspecified: Secondary | ICD-10-CM | POA: Diagnosis not present

## 2020-03-03 DIAGNOSIS — N1831 Chronic kidney disease, stage 3a: Secondary | ICD-10-CM | POA: Diagnosis not present

## 2020-03-03 DIAGNOSIS — C4499 Other specified malignant neoplasm of skin, unspecified: Secondary | ICD-10-CM | POA: Diagnosis not present

## 2020-03-03 DIAGNOSIS — M25662 Stiffness of left knee, not elsewhere classified: Secondary | ICD-10-CM | POA: Diagnosis not present

## 2020-03-03 DIAGNOSIS — D123 Benign neoplasm of transverse colon: Secondary | ICD-10-CM | POA: Diagnosis not present

## 2020-03-03 DIAGNOSIS — F4321 Adjustment disorder with depressed mood: Secondary | ICD-10-CM | POA: Diagnosis not present

## 2020-03-03 DIAGNOSIS — H02831 Dermatochalasis of right upper eyelid: Secondary | ICD-10-CM | POA: Diagnosis not present

## 2020-03-03 DIAGNOSIS — N4341 Spermatocele of epididymis, single: Secondary | ICD-10-CM | POA: Diagnosis not present

## 2020-03-03 DIAGNOSIS — H353111 Nonexudative age-related macular degeneration, right eye, early dry stage: Secondary | ICD-10-CM | POA: Diagnosis not present

## 2020-03-03 DIAGNOSIS — L400 Psoriasis vulgaris: Secondary | ICD-10-CM | POA: Diagnosis not present

## 2020-03-03 DIAGNOSIS — K640 First degree hemorrhoids: Secondary | ICD-10-CM | POA: Diagnosis not present

## 2020-03-03 DIAGNOSIS — N390 Urinary tract infection, site not specified: Secondary | ICD-10-CM | POA: Diagnosis not present

## 2020-03-03 DIAGNOSIS — S83231A Complex tear of medial meniscus, current injury, right knee, initial encounter: Secondary | ICD-10-CM | POA: Diagnosis not present

## 2020-03-03 DIAGNOSIS — N760 Acute vaginitis: Secondary | ICD-10-CM | POA: Diagnosis not present

## 2020-03-03 DIAGNOSIS — J060 Acute laryngopharyngitis: Secondary | ICD-10-CM | POA: Diagnosis not present

## 2020-03-03 DIAGNOSIS — R8290 Unspecified abnormal findings in urine: Secondary | ICD-10-CM | POA: Diagnosis not present

## 2020-03-03 DIAGNOSIS — E538 Deficiency of other specified B group vitamins: Secondary | ICD-10-CM | POA: Diagnosis not present

## 2020-03-03 DIAGNOSIS — D120 Benign neoplasm of cecum: Secondary | ICD-10-CM | POA: Diagnosis not present

## 2020-03-03 DIAGNOSIS — H35363 Drusen (degenerative) of macula, bilateral: Secondary | ICD-10-CM | POA: Diagnosis not present

## 2020-03-03 DIAGNOSIS — K644 Residual hemorrhoidal skin tags: Secondary | ICD-10-CM | POA: Diagnosis not present

## 2020-03-03 DIAGNOSIS — Z20822 Contact with and (suspected) exposure to covid-19: Secondary | ICD-10-CM | POA: Diagnosis not present

## 2020-03-03 DIAGNOSIS — M859 Disorder of bone density and structure, unspecified: Secondary | ICD-10-CM | POA: Diagnosis not present

## 2020-03-03 DIAGNOSIS — K5901 Slow transit constipation: Secondary | ICD-10-CM | POA: Diagnosis not present

## 2020-03-03 DIAGNOSIS — M9905 Segmental and somatic dysfunction of pelvic region: Secondary | ICD-10-CM | POA: Diagnosis not present

## 2020-03-03 DIAGNOSIS — Z01818 Encounter for other preprocedural examination: Secondary | ICD-10-CM | POA: Diagnosis not present

## 2020-03-03 DIAGNOSIS — D044 Carcinoma in situ of skin of scalp and neck: Secondary | ICD-10-CM | POA: Diagnosis not present

## 2020-03-03 DIAGNOSIS — Q828 Other specified congenital malformations of skin: Secondary | ICD-10-CM | POA: Diagnosis not present

## 2020-03-03 DIAGNOSIS — Z0001 Encounter for general adult medical examination with abnormal findings: Secondary | ICD-10-CM | POA: Diagnosis not present

## 2020-03-03 DIAGNOSIS — C9000 Multiple myeloma not having achieved remission: Secondary | ICD-10-CM | POA: Diagnosis not present

## 2020-03-03 DIAGNOSIS — H10413 Chronic giant papillary conjunctivitis, bilateral: Secondary | ICD-10-CM | POA: Diagnosis not present

## 2020-03-03 DIAGNOSIS — Q211 Atrial septal defect: Secondary | ICD-10-CM | POA: Diagnosis not present

## 2020-03-03 DIAGNOSIS — G2581 Restless legs syndrome: Secondary | ICD-10-CM | POA: Diagnosis not present

## 2020-03-03 DIAGNOSIS — J302 Other seasonal allergic rhinitis: Secondary | ICD-10-CM | POA: Diagnosis not present

## 2020-03-03 DIAGNOSIS — S61451S Open bite of right hand, sequela: Secondary | ICD-10-CM | POA: Diagnosis not present

## 2020-03-03 DIAGNOSIS — D2261 Melanocytic nevi of right upper limb, including shoulder: Secondary | ICD-10-CM | POA: Diagnosis not present

## 2020-03-03 DIAGNOSIS — J188 Other pneumonia, unspecified organism: Secondary | ICD-10-CM | POA: Diagnosis not present

## 2020-03-03 DIAGNOSIS — A419 Sepsis, unspecified organism: Secondary | ICD-10-CM | POA: Diagnosis not present

## 2020-03-03 DIAGNOSIS — I951 Orthostatic hypotension: Secondary | ICD-10-CM | POA: Diagnosis not present

## 2020-03-03 DIAGNOSIS — A4151 Sepsis due to Escherichia coli [E. coli]: Secondary | ICD-10-CM | POA: Diagnosis not present

## 2020-03-03 DIAGNOSIS — J3489 Other specified disorders of nose and nasal sinuses: Secondary | ICD-10-CM | POA: Diagnosis not present

## 2020-03-03 DIAGNOSIS — H5203 Hypermetropia, bilateral: Secondary | ICD-10-CM | POA: Diagnosis not present

## 2020-03-03 DIAGNOSIS — M1712 Unilateral primary osteoarthritis, left knee: Secondary | ICD-10-CM | POA: Diagnosis not present

## 2020-03-03 DIAGNOSIS — Z1331 Encounter for screening for depression: Secondary | ICD-10-CM | POA: Diagnosis not present

## 2020-03-03 DIAGNOSIS — Z833 Family history of diabetes mellitus: Secondary | ICD-10-CM | POA: Diagnosis not present

## 2020-03-03 DIAGNOSIS — Z9981 Dependence on supplemental oxygen: Secondary | ICD-10-CM | POA: Diagnosis not present

## 2020-03-03 DIAGNOSIS — J328 Other chronic sinusitis: Secondary | ICD-10-CM | POA: Diagnosis not present

## 2020-03-03 DIAGNOSIS — M778 Other enthesopathies, not elsewhere classified: Secondary | ICD-10-CM | POA: Diagnosis not present

## 2020-03-03 DIAGNOSIS — B079 Viral wart, unspecified: Secondary | ICD-10-CM | POA: Diagnosis not present

## 2020-03-03 DIAGNOSIS — I6521 Occlusion and stenosis of right carotid artery: Secondary | ICD-10-CM | POA: Diagnosis not present

## 2020-03-03 DIAGNOSIS — I071 Rheumatic tricuspid insufficiency: Secondary | ICD-10-CM | POA: Diagnosis not present

## 2020-03-03 DIAGNOSIS — D23122 Other benign neoplasm of skin of left lower eyelid, including canthus: Secondary | ICD-10-CM | POA: Diagnosis not present

## 2020-03-03 DIAGNOSIS — M2060 Acquired deformities of toe(s), unspecified, unspecified foot: Secondary | ICD-10-CM | POA: Diagnosis not present

## 2020-03-03 DIAGNOSIS — S51011A Laceration without foreign body of right elbow, initial encounter: Secondary | ICD-10-CM | POA: Diagnosis not present

## 2020-03-03 DIAGNOSIS — K2950 Unspecified chronic gastritis without bleeding: Secondary | ICD-10-CM | POA: Diagnosis not present

## 2020-03-03 DIAGNOSIS — Z85828 Personal history of other malignant neoplasm of skin: Secondary | ICD-10-CM | POA: Diagnosis not present

## 2020-03-03 DIAGNOSIS — C4490 Unspecified malignant neoplasm of skin, unspecified: Secondary | ICD-10-CM | POA: Diagnosis not present

## 2020-03-03 DIAGNOSIS — K9589 Other complications of other bariatric procedure: Secondary | ICD-10-CM | POA: Diagnosis not present

## 2020-03-03 DIAGNOSIS — M50122 Cervical disc disorder at C5-C6 level with radiculopathy: Secondary | ICD-10-CM | POA: Diagnosis not present

## 2020-03-03 DIAGNOSIS — Z0100 Encounter for examination of eyes and vision without abnormal findings: Secondary | ICD-10-CM | POA: Diagnosis not present

## 2020-03-03 DIAGNOSIS — F410 Panic disorder [episodic paroxysmal anxiety] without agoraphobia: Secondary | ICD-10-CM | POA: Diagnosis not present

## 2020-03-03 DIAGNOSIS — I252 Old myocardial infarction: Secondary | ICD-10-CM | POA: Diagnosis not present

## 2020-03-03 DIAGNOSIS — R351 Nocturia: Secondary | ICD-10-CM | POA: Diagnosis not present

## 2020-03-03 DIAGNOSIS — Z902 Acquired absence of lung [part of]: Secondary | ICD-10-CM | POA: Diagnosis not present

## 2020-03-03 DIAGNOSIS — C649 Malignant neoplasm of unspecified kidney, except renal pelvis: Secondary | ICD-10-CM | POA: Diagnosis not present

## 2020-03-03 DIAGNOSIS — Z6833 Body mass index (BMI) 33.0-33.9, adult: Secondary | ICD-10-CM | POA: Diagnosis not present

## 2020-03-03 DIAGNOSIS — I480 Paroxysmal atrial fibrillation: Secondary | ICD-10-CM | POA: Diagnosis not present

## 2020-03-03 DIAGNOSIS — M2560 Stiffness of unspecified joint, not elsewhere classified: Secondary | ICD-10-CM | POA: Diagnosis not present

## 2020-03-03 DIAGNOSIS — K2210 Ulcer of esophagus without bleeding: Secondary | ICD-10-CM | POA: Diagnosis not present

## 2020-03-03 DIAGNOSIS — R269 Unspecified abnormalities of gait and mobility: Secondary | ICD-10-CM | POA: Diagnosis not present

## 2020-03-03 DIAGNOSIS — M79672 Pain in left foot: Secondary | ICD-10-CM | POA: Diagnosis not present

## 2020-03-03 DIAGNOSIS — Z7401 Bed confinement status: Secondary | ICD-10-CM | POA: Diagnosis not present

## 2020-03-03 DIAGNOSIS — F0151 Vascular dementia with behavioral disturbance: Secondary | ICD-10-CM | POA: Diagnosis not present

## 2020-03-03 DIAGNOSIS — E1165 Type 2 diabetes mellitus with hyperglycemia: Secondary | ICD-10-CM | POA: Diagnosis not present

## 2020-03-03 DIAGNOSIS — H35431 Paving stone degeneration of retina, right eye: Secondary | ICD-10-CM | POA: Diagnosis not present

## 2020-03-03 DIAGNOSIS — I5042 Chronic combined systolic (congestive) and diastolic (congestive) heart failure: Secondary | ICD-10-CM | POA: Diagnosis not present

## 2020-03-03 DIAGNOSIS — I129 Hypertensive chronic kidney disease with stage 1 through stage 4 chronic kidney disease, or unspecified chronic kidney disease: Secondary | ICD-10-CM | POA: Diagnosis not present

## 2020-03-03 DIAGNOSIS — Z872 Personal history of diseases of the skin and subcutaneous tissue: Secondary | ICD-10-CM | POA: Diagnosis not present

## 2020-03-03 DIAGNOSIS — M1612 Unilateral primary osteoarthritis, left hip: Secondary | ICD-10-CM | POA: Diagnosis not present

## 2020-03-03 DIAGNOSIS — R82998 Other abnormal findings in urine: Secondary | ICD-10-CM | POA: Diagnosis not present

## 2020-03-03 DIAGNOSIS — Z882 Allergy status to sulfonamides status: Secondary | ICD-10-CM | POA: Diagnosis not present

## 2020-03-03 DIAGNOSIS — I5030 Unspecified diastolic (congestive) heart failure: Secondary | ICD-10-CM | POA: Diagnosis not present

## 2020-03-03 DIAGNOSIS — M25512 Pain in left shoulder: Secondary | ICD-10-CM | POA: Diagnosis not present

## 2020-03-03 DIAGNOSIS — R319 Hematuria, unspecified: Secondary | ICD-10-CM | POA: Diagnosis not present

## 2020-03-03 DIAGNOSIS — N400 Enlarged prostate without lower urinary tract symptoms: Secondary | ICD-10-CM | POA: Diagnosis not present

## 2020-03-03 DIAGNOSIS — H60393 Other infective otitis externa, bilateral: Secondary | ICD-10-CM | POA: Diagnosis not present

## 2020-03-03 DIAGNOSIS — K4090 Unilateral inguinal hernia, without obstruction or gangrene, not specified as recurrent: Secondary | ICD-10-CM | POA: Diagnosis not present

## 2020-03-03 DIAGNOSIS — E1122 Type 2 diabetes mellitus with diabetic chronic kidney disease: Secondary | ICD-10-CM | POA: Diagnosis not present

## 2020-03-03 DIAGNOSIS — I25708 Atherosclerosis of coronary artery bypass graft(s), unspecified, with other forms of angina pectoris: Secondary | ICD-10-CM | POA: Diagnosis not present

## 2020-03-03 DIAGNOSIS — N5089 Other specified disorders of the male genital organs: Secondary | ICD-10-CM | POA: Diagnosis not present

## 2020-03-03 DIAGNOSIS — K51911 Ulcerative colitis, unspecified with rectal bleeding: Secondary | ICD-10-CM | POA: Diagnosis not present

## 2020-03-03 DIAGNOSIS — R635 Abnormal weight gain: Secondary | ICD-10-CM | POA: Diagnosis not present

## 2020-03-03 DIAGNOSIS — Z885 Allergy status to narcotic agent status: Secondary | ICD-10-CM | POA: Diagnosis not present

## 2020-03-03 DIAGNOSIS — C538 Malignant neoplasm of overlapping sites of cervix uteri: Secondary | ICD-10-CM | POA: Diagnosis not present

## 2020-03-03 DIAGNOSIS — Z8639 Personal history of other endocrine, nutritional and metabolic disease: Secondary | ICD-10-CM | POA: Diagnosis not present

## 2020-03-03 DIAGNOSIS — E871 Hypo-osmolality and hyponatremia: Secondary | ICD-10-CM | POA: Diagnosis not present

## 2020-03-03 DIAGNOSIS — S82112D Displaced fracture of left tibial spine, subsequent encounter for closed fracture with routine healing: Secondary | ICD-10-CM | POA: Diagnosis not present

## 2020-03-03 DIAGNOSIS — R600 Localized edema: Secondary | ICD-10-CM | POA: Diagnosis not present

## 2020-03-03 DIAGNOSIS — M9901 Segmental and somatic dysfunction of cervical region: Secondary | ICD-10-CM | POA: Diagnosis not present

## 2020-03-03 DIAGNOSIS — H9201 Otalgia, right ear: Secondary | ICD-10-CM | POA: Diagnosis not present

## 2020-03-03 DIAGNOSIS — R079 Chest pain, unspecified: Secondary | ICD-10-CM | POA: Diagnosis not present

## 2020-03-03 DIAGNOSIS — I4891 Unspecified atrial fibrillation: Secondary | ICD-10-CM | POA: Diagnosis not present

## 2020-03-03 DIAGNOSIS — Z87442 Personal history of urinary calculi: Secondary | ICD-10-CM | POA: Diagnosis not present

## 2020-03-03 DIAGNOSIS — S82831D Other fracture of upper and lower end of right fibula, subsequent encounter for closed fracture with routine healing: Secondary | ICD-10-CM | POA: Diagnosis not present

## 2020-03-03 DIAGNOSIS — L98421 Non-pressure chronic ulcer of back limited to breakdown of skin: Secondary | ICD-10-CM | POA: Diagnosis not present

## 2020-03-03 DIAGNOSIS — I714 Abdominal aortic aneurysm, without rupture: Secondary | ICD-10-CM | POA: Diagnosis not present

## 2020-03-03 DIAGNOSIS — K439 Ventral hernia without obstruction or gangrene: Secondary | ICD-10-CM | POA: Diagnosis not present

## 2020-03-03 DIAGNOSIS — N184 Chronic kidney disease, stage 4 (severe): Secondary | ICD-10-CM | POA: Diagnosis not present

## 2020-03-03 DIAGNOSIS — Z951 Presence of aortocoronary bypass graft: Secondary | ICD-10-CM | POA: Diagnosis not present

## 2020-03-03 DIAGNOSIS — H9202 Otalgia, left ear: Secondary | ICD-10-CM | POA: Diagnosis not present

## 2020-03-03 DIAGNOSIS — M1711 Unilateral primary osteoarthritis, right knee: Secondary | ICD-10-CM | POA: Diagnosis not present

## 2020-03-03 DIAGNOSIS — C561 Malignant neoplasm of right ovary: Secondary | ICD-10-CM | POA: Diagnosis not present

## 2020-03-03 DIAGNOSIS — E785 Hyperlipidemia, unspecified: Secondary | ICD-10-CM | POA: Diagnosis not present

## 2020-03-03 DIAGNOSIS — E8809 Other disorders of plasma-protein metabolism, not elsewhere classified: Secondary | ICD-10-CM | POA: Diagnosis not present

## 2020-03-03 DIAGNOSIS — J441 Chronic obstructive pulmonary disease with (acute) exacerbation: Secondary | ICD-10-CM | POA: Diagnosis not present

## 2020-03-03 DIAGNOSIS — Z7189 Other specified counseling: Secondary | ICD-10-CM | POA: Diagnosis not present

## 2020-03-03 DIAGNOSIS — I69328 Other speech and language deficits following cerebral infarction: Secondary | ICD-10-CM | POA: Diagnosis not present

## 2020-03-03 DIAGNOSIS — E162 Hypoglycemia, unspecified: Secondary | ICD-10-CM | POA: Diagnosis not present

## 2020-03-03 DIAGNOSIS — M170 Bilateral primary osteoarthritis of knee: Secondary | ICD-10-CM | POA: Diagnosis not present

## 2020-03-03 DIAGNOSIS — D72829 Elevated white blood cell count, unspecified: Secondary | ICD-10-CM | POA: Diagnosis not present

## 2020-03-03 DIAGNOSIS — Z6824 Body mass index (BMI) 24.0-24.9, adult: Secondary | ICD-10-CM | POA: Diagnosis not present

## 2020-03-03 DIAGNOSIS — R0902 Hypoxemia: Secondary | ICD-10-CM | POA: Diagnosis not present

## 2020-03-03 DIAGNOSIS — Z01810 Encounter for preprocedural cardiovascular examination: Secondary | ICD-10-CM | POA: Diagnosis not present

## 2020-03-03 DIAGNOSIS — R001 Bradycardia, unspecified: Secondary | ICD-10-CM | POA: Diagnosis not present

## 2020-03-03 DIAGNOSIS — R39198 Other difficulties with micturition: Secondary | ICD-10-CM | POA: Diagnosis not present

## 2020-03-03 DIAGNOSIS — R140 Abdominal distension (gaseous): Secondary | ICD-10-CM | POA: Diagnosis not present

## 2020-03-03 DIAGNOSIS — N898 Other specified noninflammatory disorders of vagina: Secondary | ICD-10-CM | POA: Diagnosis not present

## 2020-03-03 DIAGNOSIS — A630 Anogenital (venereal) warts: Secondary | ICD-10-CM | POA: Diagnosis not present

## 2020-03-03 DIAGNOSIS — R262 Difficulty in walking, not elsewhere classified: Secondary | ICD-10-CM | POA: Diagnosis not present

## 2020-03-03 DIAGNOSIS — Z1231 Encounter for screening mammogram for malignant neoplasm of breast: Secondary | ICD-10-CM | POA: Diagnosis not present

## 2020-03-03 DIAGNOSIS — L0291 Cutaneous abscess, unspecified: Secondary | ICD-10-CM | POA: Diagnosis not present

## 2020-03-03 DIAGNOSIS — M25552 Pain in left hip: Secondary | ICD-10-CM | POA: Diagnosis not present

## 2020-03-03 DIAGNOSIS — J208 Acute bronchitis due to other specified organisms: Secondary | ICD-10-CM | POA: Diagnosis not present

## 2020-03-03 DIAGNOSIS — N132 Hydronephrosis with renal and ureteral calculous obstruction: Secondary | ICD-10-CM | POA: Diagnosis not present

## 2020-03-03 DIAGNOSIS — Z1159 Encounter for screening for other viral diseases: Secondary | ICD-10-CM | POA: Diagnosis not present

## 2020-03-03 DIAGNOSIS — L97211 Non-pressure chronic ulcer of right calf limited to breakdown of skin: Secondary | ICD-10-CM | POA: Diagnosis not present

## 2020-03-03 DIAGNOSIS — S299XXA Unspecified injury of thorax, initial encounter: Secondary | ICD-10-CM | POA: Diagnosis not present

## 2020-03-03 DIAGNOSIS — D124 Benign neoplasm of descending colon: Secondary | ICD-10-CM | POA: Diagnosis not present

## 2020-03-03 DIAGNOSIS — K915 Postcholecystectomy syndrome: Secondary | ICD-10-CM | POA: Diagnosis not present

## 2020-03-03 DIAGNOSIS — E038 Other specified hypothyroidism: Secondary | ICD-10-CM | POA: Diagnosis not present

## 2020-03-03 DIAGNOSIS — Z96611 Presence of right artificial shoulder joint: Secondary | ICD-10-CM | POA: Diagnosis not present

## 2020-03-03 DIAGNOSIS — H25813 Combined forms of age-related cataract, bilateral: Secondary | ICD-10-CM | POA: Diagnosis not present

## 2020-03-03 DIAGNOSIS — J3081 Allergic rhinitis due to animal (cat) (dog) hair and dander: Secondary | ICD-10-CM | POA: Diagnosis not present

## 2020-03-03 DIAGNOSIS — I5032 Chronic diastolic (congestive) heart failure: Secondary | ICD-10-CM | POA: Diagnosis not present

## 2020-03-03 DIAGNOSIS — D473 Essential (hemorrhagic) thrombocythemia: Secondary | ICD-10-CM | POA: Diagnosis not present

## 2020-03-03 DIAGNOSIS — C50611 Malignant neoplasm of axillary tail of right female breast: Secondary | ICD-10-CM | POA: Diagnosis not present

## 2020-03-03 DIAGNOSIS — Z471 Aftercare following joint replacement surgery: Secondary | ICD-10-CM | POA: Diagnosis not present

## 2020-03-03 DIAGNOSIS — H02403 Unspecified ptosis of bilateral eyelids: Secondary | ICD-10-CM | POA: Diagnosis not present

## 2020-03-03 DIAGNOSIS — H353122 Nonexudative age-related macular degeneration, left eye, intermediate dry stage: Secondary | ICD-10-CM | POA: Diagnosis not present

## 2020-03-03 DIAGNOSIS — E8881 Metabolic syndrome: Secondary | ICD-10-CM | POA: Diagnosis not present

## 2020-03-03 DIAGNOSIS — F330 Major depressive disorder, recurrent, mild: Secondary | ICD-10-CM | POA: Diagnosis not present

## 2020-03-03 DIAGNOSIS — R2689 Other abnormalities of gait and mobility: Secondary | ICD-10-CM | POA: Diagnosis not present

## 2020-03-03 DIAGNOSIS — S7292XD Unspecified fracture of left femur, subsequent encounter for closed fracture with routine healing: Secondary | ICD-10-CM | POA: Diagnosis not present

## 2020-03-03 DIAGNOSIS — I519 Heart disease, unspecified: Secondary | ICD-10-CM | POA: Diagnosis not present

## 2020-03-03 DIAGNOSIS — Z9109 Other allergy status, other than to drugs and biological substances: Secondary | ICD-10-CM | POA: Diagnosis not present

## 2020-03-03 DIAGNOSIS — D2271 Melanocytic nevi of right lower limb, including hip: Secondary | ICD-10-CM | POA: Diagnosis not present

## 2020-03-03 DIAGNOSIS — R509 Fever, unspecified: Secondary | ICD-10-CM | POA: Diagnosis not present

## 2020-03-03 DIAGNOSIS — C44712 Basal cell carcinoma of skin of right lower limb, including hip: Secondary | ICD-10-CM | POA: Diagnosis not present

## 2020-03-03 DIAGNOSIS — E113593 Type 2 diabetes mellitus with proliferative diabetic retinopathy without macular edema, bilateral: Secondary | ICD-10-CM | POA: Diagnosis not present

## 2020-03-03 DIAGNOSIS — L97221 Non-pressure chronic ulcer of left calf limited to breakdown of skin: Secondary | ICD-10-CM | POA: Diagnosis not present

## 2020-03-03 DIAGNOSIS — E7849 Other hyperlipidemia: Secondary | ICD-10-CM | POA: Diagnosis not present

## 2020-03-03 DIAGNOSIS — E2740 Unspecified adrenocortical insufficiency: Secondary | ICD-10-CM | POA: Diagnosis not present

## 2020-03-03 DIAGNOSIS — C3431 Malignant neoplasm of lower lobe, right bronchus or lung: Secondary | ICD-10-CM | POA: Diagnosis not present

## 2020-03-03 DIAGNOSIS — R5383 Other fatigue: Secondary | ICD-10-CM | POA: Diagnosis not present

## 2020-03-03 DIAGNOSIS — S32000A Wedge compression fracture of unspecified lumbar vertebra, initial encounter for closed fracture: Secondary | ICD-10-CM | POA: Diagnosis not present

## 2020-03-03 DIAGNOSIS — D126 Benign neoplasm of colon, unspecified: Secondary | ICD-10-CM | POA: Diagnosis not present

## 2020-03-03 DIAGNOSIS — S52571A Other intraarticular fracture of lower end of right radius, initial encounter for closed fracture: Secondary | ICD-10-CM | POA: Diagnosis not present

## 2020-03-03 DIAGNOSIS — K6389 Other specified diseases of intestine: Secondary | ICD-10-CM | POA: Diagnosis not present

## 2020-03-03 DIAGNOSIS — M5412 Radiculopathy, cervical region: Secondary | ICD-10-CM | POA: Diagnosis not present

## 2020-03-03 DIAGNOSIS — R160 Hepatomegaly, not elsewhere classified: Secondary | ICD-10-CM | POA: Diagnosis not present

## 2020-03-03 DIAGNOSIS — H26492 Other secondary cataract, left eye: Secondary | ICD-10-CM | POA: Diagnosis not present

## 2020-03-03 DIAGNOSIS — M62551 Muscle wasting and atrophy, not elsewhere classified, right thigh: Secondary | ICD-10-CM | POA: Diagnosis not present

## 2020-03-03 DIAGNOSIS — H0288B Meibomian gland dysfunction left eye, upper and lower eyelids: Secondary | ICD-10-CM | POA: Diagnosis not present

## 2020-03-03 DIAGNOSIS — I70242 Atherosclerosis of native arteries of left leg with ulceration of calf: Secondary | ICD-10-CM | POA: Diagnosis not present

## 2020-03-03 DIAGNOSIS — K31819 Angiodysplasia of stomach and duodenum without bleeding: Secondary | ICD-10-CM | POA: Diagnosis not present

## 2020-03-03 DIAGNOSIS — Z8249 Family history of ischemic heart disease and other diseases of the circulatory system: Secondary | ICD-10-CM | POA: Diagnosis not present

## 2020-03-03 DIAGNOSIS — I444 Left anterior fascicular block: Secondary | ICD-10-CM | POA: Diagnosis not present

## 2020-03-03 DIAGNOSIS — H25811 Combined forms of age-related cataract, right eye: Secondary | ICD-10-CM | POA: Diagnosis not present

## 2020-03-03 DIAGNOSIS — T380X5A Adverse effect of glucocorticoids and synthetic analogues, initial encounter: Secondary | ICD-10-CM | POA: Diagnosis not present

## 2020-03-03 DIAGNOSIS — N320 Bladder-neck obstruction: Secondary | ICD-10-CM | POA: Diagnosis not present

## 2020-03-03 DIAGNOSIS — R399 Unspecified symptoms and signs involving the genitourinary system: Secondary | ICD-10-CM | POA: Diagnosis not present

## 2020-03-03 DIAGNOSIS — K529 Noninfective gastroenteritis and colitis, unspecified: Secondary | ICD-10-CM | POA: Diagnosis not present

## 2020-03-03 DIAGNOSIS — N200 Calculus of kidney: Secondary | ICD-10-CM | POA: Diagnosis not present

## 2020-03-03 DIAGNOSIS — M8448XD Pathological fracture, other site, subsequent encounter for fracture with routine healing: Secondary | ICD-10-CM | POA: Diagnosis not present

## 2020-03-03 DIAGNOSIS — B370 Candidal stomatitis: Secondary | ICD-10-CM | POA: Diagnosis not present

## 2020-03-03 DIAGNOSIS — I2782 Chronic pulmonary embolism: Secondary | ICD-10-CM | POA: Diagnosis not present

## 2020-03-03 DIAGNOSIS — R2990 Unspecified symptoms and signs involving the nervous system: Secondary | ICD-10-CM | POA: Diagnosis not present

## 2020-03-03 DIAGNOSIS — K5909 Other constipation: Secondary | ICD-10-CM | POA: Diagnosis not present

## 2020-03-03 DIAGNOSIS — B0089 Other herpesviral infection: Secondary | ICD-10-CM | POA: Diagnosis not present

## 2020-03-03 DIAGNOSIS — E1159 Type 2 diabetes mellitus with other circulatory complications: Secondary | ICD-10-CM | POA: Diagnosis not present

## 2020-03-03 DIAGNOSIS — L89892 Pressure ulcer of other site, stage 2: Secondary | ICD-10-CM | POA: Diagnosis not present

## 2020-03-03 DIAGNOSIS — R5381 Other malaise: Secondary | ICD-10-CM | POA: Diagnosis not present

## 2020-03-03 DIAGNOSIS — G4733 Obstructive sleep apnea (adult) (pediatric): Secondary | ICD-10-CM | POA: Diagnosis not present

## 2020-03-03 DIAGNOSIS — R011 Cardiac murmur, unspecified: Secondary | ICD-10-CM | POA: Diagnosis not present

## 2020-03-03 DIAGNOSIS — I6389 Other cerebral infarction: Secondary | ICD-10-CM | POA: Diagnosis not present

## 2020-03-03 DIAGNOSIS — Z9181 History of falling: Secondary | ICD-10-CM | POA: Diagnosis not present

## 2020-03-03 DIAGNOSIS — Z9889 Other specified postprocedural states: Secondary | ICD-10-CM | POA: Diagnosis not present

## 2020-03-03 DIAGNOSIS — I4430 Unspecified atrioventricular block: Secondary | ICD-10-CM | POA: Diagnosis not present

## 2020-03-03 DIAGNOSIS — D61818 Other pancytopenia: Secondary | ICD-10-CM | POA: Diagnosis not present

## 2020-03-03 DIAGNOSIS — Z6820 Body mass index (BMI) 20.0-20.9, adult: Secondary | ICD-10-CM | POA: Diagnosis not present

## 2020-03-03 DIAGNOSIS — E042 Nontoxic multinodular goiter: Secondary | ICD-10-CM | POA: Diagnosis not present

## 2020-03-03 DIAGNOSIS — C799 Secondary malignant neoplasm of unspecified site: Secondary | ICD-10-CM | POA: Diagnosis not present

## 2020-03-03 DIAGNOSIS — I050 Rheumatic mitral stenosis: Secondary | ICD-10-CM | POA: Diagnosis not present

## 2020-03-03 DIAGNOSIS — M5116 Intervertebral disc disorders with radiculopathy, lumbar region: Secondary | ICD-10-CM | POA: Diagnosis not present

## 2020-03-03 DIAGNOSIS — Z950 Presence of cardiac pacemaker: Secondary | ICD-10-CM | POA: Diagnosis not present

## 2020-03-03 DIAGNOSIS — Z7901 Long term (current) use of anticoagulants: Secondary | ICD-10-CM | POA: Diagnosis not present

## 2020-03-03 DIAGNOSIS — C61 Malignant neoplasm of prostate: Secondary | ICD-10-CM | POA: Diagnosis not present

## 2020-03-03 DIAGNOSIS — F419 Anxiety disorder, unspecified: Secondary | ICD-10-CM | POA: Diagnosis not present

## 2020-03-03 DIAGNOSIS — M5136 Other intervertebral disc degeneration, lumbar region: Secondary | ICD-10-CM | POA: Diagnosis not present

## 2020-03-03 DIAGNOSIS — H40052 Ocular hypertension, left eye: Secondary | ICD-10-CM | POA: Diagnosis not present

## 2020-03-03 DIAGNOSIS — H409 Unspecified glaucoma: Secondary | ICD-10-CM | POA: Diagnosis not present

## 2020-03-03 DIAGNOSIS — J440 Chronic obstructive pulmonary disease with acute lower respiratory infection: Secondary | ICD-10-CM | POA: Diagnosis not present

## 2020-03-03 DIAGNOSIS — M546 Pain in thoracic spine: Secondary | ICD-10-CM | POA: Diagnosis not present

## 2020-03-03 DIAGNOSIS — H43822 Vitreomacular adhesion, left eye: Secondary | ICD-10-CM | POA: Diagnosis not present

## 2020-03-03 DIAGNOSIS — H2511 Age-related nuclear cataract, right eye: Secondary | ICD-10-CM | POA: Diagnosis not present

## 2020-03-03 DIAGNOSIS — C50111 Malignant neoplasm of central portion of right female breast: Secondary | ICD-10-CM | POA: Diagnosis not present

## 2020-03-03 DIAGNOSIS — E875 Hyperkalemia: Secondary | ICD-10-CM | POA: Diagnosis not present

## 2020-03-03 DIAGNOSIS — K7460 Unspecified cirrhosis of liver: Secondary | ICD-10-CM | POA: Diagnosis not present

## 2020-03-03 DIAGNOSIS — H401132 Primary open-angle glaucoma, bilateral, moderate stage: Secondary | ICD-10-CM | POA: Diagnosis not present

## 2020-03-03 DIAGNOSIS — G309 Alzheimer's disease, unspecified: Secondary | ICD-10-CM | POA: Diagnosis not present

## 2020-03-03 DIAGNOSIS — K589 Irritable bowel syndrome without diarrhea: Secondary | ICD-10-CM | POA: Diagnosis not present

## 2020-03-03 DIAGNOSIS — Z139 Encounter for screening, unspecified: Secondary | ICD-10-CM | POA: Diagnosis not present

## 2020-03-03 DIAGNOSIS — R768 Other specified abnormal immunological findings in serum: Secondary | ICD-10-CM | POA: Diagnosis not present

## 2020-03-03 DIAGNOSIS — I25119 Atherosclerotic heart disease of native coronary artery with unspecified angina pectoris: Secondary | ICD-10-CM | POA: Diagnosis not present

## 2020-03-03 DIAGNOSIS — S62034A Nondisplaced fracture of proximal third of navicular [scaphoid] bone of right wrist, initial encounter for closed fracture: Secondary | ICD-10-CM | POA: Diagnosis not present

## 2020-03-03 DIAGNOSIS — R202 Paresthesia of skin: Secondary | ICD-10-CM | POA: Diagnosis not present

## 2020-03-03 DIAGNOSIS — Z7984 Long term (current) use of oral hypoglycemic drugs: Secondary | ICD-10-CM | POA: Diagnosis not present

## 2020-03-03 DIAGNOSIS — R531 Weakness: Secondary | ICD-10-CM | POA: Diagnosis not present

## 2020-03-03 DIAGNOSIS — I471 Supraventricular tachycardia: Secondary | ICD-10-CM | POA: Diagnosis not present

## 2020-03-03 DIAGNOSIS — N289 Disorder of kidney and ureter, unspecified: Secondary | ICD-10-CM | POA: Diagnosis not present

## 2020-03-03 DIAGNOSIS — Z780 Asymptomatic menopausal state: Secondary | ICD-10-CM | POA: Diagnosis not present

## 2020-03-03 DIAGNOSIS — F4310 Post-traumatic stress disorder, unspecified: Secondary | ICD-10-CM | POA: Diagnosis not present

## 2020-03-03 DIAGNOSIS — M810 Age-related osteoporosis without current pathological fracture: Secondary | ICD-10-CM | POA: Diagnosis not present

## 2020-03-03 DIAGNOSIS — C4491 Basal cell carcinoma of skin, unspecified: Secondary | ICD-10-CM | POA: Diagnosis not present

## 2020-03-03 DIAGNOSIS — H353221 Exudative age-related macular degeneration, left eye, with active choroidal neovascularization: Secondary | ICD-10-CM | POA: Diagnosis not present

## 2020-03-03 DIAGNOSIS — I6782 Cerebral ischemia: Secondary | ICD-10-CM | POA: Diagnosis not present

## 2020-03-03 DIAGNOSIS — R918 Other nonspecific abnormal finding of lung field: Secondary | ICD-10-CM | POA: Diagnosis not present

## 2020-03-03 DIAGNOSIS — M5030 Other cervical disc degeneration, unspecified cervical region: Secondary | ICD-10-CM | POA: Diagnosis not present

## 2020-03-03 DIAGNOSIS — H26493 Other secondary cataract, bilateral: Secondary | ICD-10-CM | POA: Diagnosis not present

## 2020-03-03 DIAGNOSIS — E079 Disorder of thyroid, unspecified: Secondary | ICD-10-CM | POA: Diagnosis not present

## 2020-03-03 DIAGNOSIS — M8088XA Other osteoporosis with current pathological fracture, vertebra(e), initial encounter for fracture: Secondary | ICD-10-CM | POA: Diagnosis not present

## 2020-03-03 DIAGNOSIS — Z8781 Personal history of (healed) traumatic fracture: Secondary | ICD-10-CM | POA: Diagnosis not present

## 2020-03-03 DIAGNOSIS — Z8701 Personal history of pneumonia (recurrent): Secondary | ICD-10-CM | POA: Diagnosis not present

## 2020-03-03 DIAGNOSIS — Q282 Arteriovenous malformation of cerebral vessels: Secondary | ICD-10-CM | POA: Diagnosis not present

## 2020-03-03 DIAGNOSIS — I950 Idiopathic hypotension: Secondary | ICD-10-CM | POA: Diagnosis not present

## 2020-03-03 DIAGNOSIS — H5201 Hypermetropia, right eye: Secondary | ICD-10-CM | POA: Diagnosis not present

## 2020-03-03 DIAGNOSIS — Z13820 Encounter for screening for osteoporosis: Secondary | ICD-10-CM | POA: Diagnosis not present

## 2020-03-03 DIAGNOSIS — C3411 Malignant neoplasm of upper lobe, right bronchus or lung: Secondary | ICD-10-CM | POA: Diagnosis not present

## 2020-03-03 DIAGNOSIS — H401133 Primary open-angle glaucoma, bilateral, severe stage: Secondary | ICD-10-CM | POA: Diagnosis not present

## 2020-03-03 DIAGNOSIS — Z0189 Encounter for other specified special examinations: Secondary | ICD-10-CM | POA: Diagnosis not present

## 2020-03-03 DIAGNOSIS — H43811 Vitreous degeneration, right eye: Secondary | ICD-10-CM | POA: Diagnosis not present

## 2020-03-03 DIAGNOSIS — G250 Essential tremor: Secondary | ICD-10-CM | POA: Diagnosis not present

## 2020-03-03 DIAGNOSIS — Z515 Encounter for palliative care: Secondary | ICD-10-CM | POA: Diagnosis not present

## 2020-03-03 DIAGNOSIS — R195 Other fecal abnormalities: Secondary | ICD-10-CM | POA: Diagnosis not present

## 2020-03-03 DIAGNOSIS — M816 Localized osteoporosis [Lequesne]: Secondary | ICD-10-CM | POA: Diagnosis not present

## 2020-03-03 DIAGNOSIS — G629 Polyneuropathy, unspecified: Secondary | ICD-10-CM | POA: Diagnosis not present

## 2020-03-03 DIAGNOSIS — S76011D Strain of muscle, fascia and tendon of right hip, subsequent encounter: Secondary | ICD-10-CM | POA: Diagnosis not present

## 2020-03-03 DIAGNOSIS — E039 Hypothyroidism, unspecified: Secondary | ICD-10-CM | POA: Diagnosis not present

## 2020-03-03 DIAGNOSIS — I8290 Acute embolism and thrombosis of unspecified vein: Secondary | ICD-10-CM | POA: Diagnosis not present

## 2020-03-03 DIAGNOSIS — R3121 Asymptomatic microscopic hematuria: Secondary | ICD-10-CM | POA: Diagnosis not present

## 2020-03-03 DIAGNOSIS — R0600 Dyspnea, unspecified: Secondary | ICD-10-CM | POA: Diagnosis not present

## 2020-03-03 DIAGNOSIS — H5371 Glare sensitivity: Secondary | ICD-10-CM | POA: Diagnosis not present

## 2020-03-03 DIAGNOSIS — R6889 Other general symptoms and signs: Secondary | ICD-10-CM | POA: Diagnosis not present

## 2020-03-03 DIAGNOSIS — M0600 Rheumatoid arthritis without rheumatoid factor, unspecified site: Secondary | ICD-10-CM | POA: Diagnosis not present

## 2020-03-03 DIAGNOSIS — H3533 Angioid streaks of macula: Secondary | ICD-10-CM | POA: Diagnosis not present

## 2020-03-03 DIAGNOSIS — L905 Scar conditions and fibrosis of skin: Secondary | ICD-10-CM | POA: Diagnosis not present

## 2020-03-03 DIAGNOSIS — M9902 Segmental and somatic dysfunction of thoracic region: Secondary | ICD-10-CM | POA: Diagnosis not present

## 2020-03-03 DIAGNOSIS — L909 Atrophic disorder of skin, unspecified: Secondary | ICD-10-CM | POA: Diagnosis not present

## 2020-03-03 DIAGNOSIS — F39 Unspecified mood [affective] disorder: Secondary | ICD-10-CM | POA: Diagnosis not present

## 2020-03-03 DIAGNOSIS — D751 Secondary polycythemia: Secondary | ICD-10-CM | POA: Diagnosis not present

## 2020-03-03 DIAGNOSIS — G2401 Drug induced subacute dyskinesia: Secondary | ICD-10-CM | POA: Diagnosis not present

## 2020-03-03 DIAGNOSIS — K760 Fatty (change of) liver, not elsewhere classified: Secondary | ICD-10-CM | POA: Diagnosis not present

## 2020-03-03 DIAGNOSIS — I420 Dilated cardiomyopathy: Secondary | ICD-10-CM | POA: Diagnosis not present

## 2020-03-03 DIAGNOSIS — I712 Thoracic aortic aneurysm, without rupture: Secondary | ICD-10-CM | POA: Diagnosis not present

## 2020-03-03 DIAGNOSIS — F331 Major depressive disorder, recurrent, moderate: Secondary | ICD-10-CM | POA: Diagnosis not present

## 2020-03-03 DIAGNOSIS — H353132 Nonexudative age-related macular degeneration, bilateral, intermediate dry stage: Secondary | ICD-10-CM | POA: Diagnosis not present

## 2020-03-03 DIAGNOSIS — J4530 Mild persistent asthma, uncomplicated: Secondary | ICD-10-CM | POA: Diagnosis not present

## 2020-03-03 DIAGNOSIS — H35352 Cystoid macular degeneration, left eye: Secondary | ICD-10-CM | POA: Diagnosis not present

## 2020-03-03 DIAGNOSIS — E088 Diabetes mellitus due to underlying condition with unspecified complications: Secondary | ICD-10-CM | POA: Diagnosis not present

## 2020-03-03 DIAGNOSIS — R1310 Dysphagia, unspecified: Secondary | ICD-10-CM | POA: Diagnosis not present

## 2020-03-03 DIAGNOSIS — R296 Repeated falls: Secondary | ICD-10-CM | POA: Diagnosis not present

## 2020-03-03 DIAGNOSIS — M4712 Other spondylosis with myelopathy, cervical region: Secondary | ICD-10-CM | POA: Diagnosis not present

## 2020-03-03 DIAGNOSIS — N3020 Other chronic cystitis without hematuria: Secondary | ICD-10-CM | POA: Diagnosis not present

## 2020-03-03 DIAGNOSIS — Z96641 Presence of right artificial hip joint: Secondary | ICD-10-CM | POA: Diagnosis not present

## 2020-03-03 DIAGNOSIS — J1282 Pneumonia due to coronavirus disease 2019: Secondary | ICD-10-CM | POA: Diagnosis not present

## 2020-03-03 DIAGNOSIS — D122 Benign neoplasm of ascending colon: Secondary | ICD-10-CM | POA: Diagnosis not present

## 2020-03-03 DIAGNOSIS — I671 Cerebral aneurysm, nonruptured: Secondary | ICD-10-CM | POA: Diagnosis not present

## 2020-03-03 DIAGNOSIS — N183 Chronic kidney disease, stage 3 unspecified: Secondary | ICD-10-CM | POA: Diagnosis not present

## 2020-03-03 DIAGNOSIS — I110 Hypertensive heart disease with heart failure: Secondary | ICD-10-CM | POA: Diagnosis not present

## 2020-03-03 DIAGNOSIS — R072 Precordial pain: Secondary | ICD-10-CM | POA: Diagnosis not present

## 2020-03-03 DIAGNOSIS — N959 Unspecified menopausal and perimenopausal disorder: Secondary | ICD-10-CM | POA: Diagnosis not present

## 2020-03-03 DIAGNOSIS — S63391A Traumatic rupture of other ligament of right wrist, initial encounter: Secondary | ICD-10-CM | POA: Diagnosis not present

## 2020-03-03 DIAGNOSIS — S79912A Unspecified injury of left hip, initial encounter: Secondary | ICD-10-CM | POA: Diagnosis not present

## 2020-03-03 DIAGNOSIS — J439 Emphysema, unspecified: Secondary | ICD-10-CM | POA: Diagnosis not present

## 2020-03-03 DIAGNOSIS — C50511 Malignant neoplasm of lower-outer quadrant of right female breast: Secondary | ICD-10-CM | POA: Diagnosis not present

## 2020-03-03 DIAGNOSIS — Z880 Allergy status to penicillin: Secondary | ICD-10-CM | POA: Diagnosis not present

## 2020-03-03 DIAGNOSIS — F1190 Opioid use, unspecified, uncomplicated: Secondary | ICD-10-CM | POA: Diagnosis not present

## 2020-03-03 DIAGNOSIS — Z87448 Personal history of other diseases of urinary system: Secondary | ICD-10-CM | POA: Diagnosis not present

## 2020-03-03 DIAGNOSIS — K5730 Diverticulosis of large intestine without perforation or abscess without bleeding: Secondary | ICD-10-CM | POA: Diagnosis not present

## 2020-03-03 DIAGNOSIS — I2699 Other pulmonary embolism without acute cor pulmonale: Secondary | ICD-10-CM | POA: Diagnosis not present

## 2020-03-03 DIAGNOSIS — D6851 Activated protein C resistance: Secondary | ICD-10-CM | POA: Diagnosis not present

## 2020-03-03 DIAGNOSIS — R0989 Other specified symptoms and signs involving the circulatory and respiratory systems: Secondary | ICD-10-CM | POA: Diagnosis not present

## 2020-03-03 DIAGNOSIS — R1314 Dysphagia, pharyngoesophageal phase: Secondary | ICD-10-CM | POA: Diagnosis not present

## 2020-03-03 DIAGNOSIS — K635 Polyp of colon: Secondary | ICD-10-CM | POA: Diagnosis not present

## 2020-03-03 DIAGNOSIS — J019 Acute sinusitis, unspecified: Secondary | ICD-10-CM | POA: Diagnosis not present

## 2020-03-03 DIAGNOSIS — Z6834 Body mass index (BMI) 34.0-34.9, adult: Secondary | ICD-10-CM | POA: Diagnosis not present

## 2020-03-03 DIAGNOSIS — J1520 Pneumonia due to staphylococcus, unspecified: Secondary | ICD-10-CM | POA: Diagnosis not present

## 2020-03-03 DIAGNOSIS — R58 Hemorrhage, not elsewhere classified: Secondary | ICD-10-CM | POA: Diagnosis not present

## 2020-03-03 DIAGNOSIS — R2243 Localized swelling, mass and lump, lower limb, bilateral: Secondary | ICD-10-CM | POA: Diagnosis not present

## 2020-03-03 DIAGNOSIS — G9341 Metabolic encephalopathy: Secondary | ICD-10-CM | POA: Diagnosis not present

## 2020-03-03 DIAGNOSIS — R1900 Intra-abdominal and pelvic swelling, mass and lump, unspecified site: Secondary | ICD-10-CM | POA: Diagnosis not present

## 2020-03-03 DIAGNOSIS — H33002 Unspecified retinal detachment with retinal break, left eye: Secondary | ICD-10-CM | POA: Diagnosis not present

## 2020-03-03 DIAGNOSIS — Z0000 Encounter for general adult medical examination without abnormal findings: Secondary | ICD-10-CM | POA: Diagnosis not present

## 2020-03-03 DIAGNOSIS — M059 Rheumatoid arthritis with rheumatoid factor, unspecified: Secondary | ICD-10-CM | POA: Diagnosis not present

## 2020-03-03 DIAGNOSIS — T8140XA Infection following a procedure, unspecified, initial encounter: Secondary | ICD-10-CM | POA: Diagnosis not present

## 2020-03-03 DIAGNOSIS — M75121 Complete rotator cuff tear or rupture of right shoulder, not specified as traumatic: Secondary | ICD-10-CM | POA: Diagnosis not present

## 2020-03-03 DIAGNOSIS — H9190 Unspecified hearing loss, unspecified ear: Secondary | ICD-10-CM | POA: Diagnosis not present

## 2020-03-03 DIAGNOSIS — L739 Follicular disorder, unspecified: Secondary | ICD-10-CM | POA: Diagnosis not present

## 2020-03-03 DIAGNOSIS — K7581 Nonalcoholic steatohepatitis (NASH): Secondary | ICD-10-CM | POA: Diagnosis not present

## 2020-03-03 DIAGNOSIS — M2569 Stiffness of other specified joint, not elsewhere classified: Secondary | ICD-10-CM | POA: Diagnosis not present

## 2020-03-03 DIAGNOSIS — R9082 White matter disease, unspecified: Secondary | ICD-10-CM | POA: Diagnosis not present

## 2020-03-03 DIAGNOSIS — B4481 Allergic bronchopulmonary aspergillosis: Secondary | ICD-10-CM | POA: Diagnosis not present

## 2020-03-03 DIAGNOSIS — K9189 Other postprocedural complications and disorders of digestive system: Secondary | ICD-10-CM | POA: Diagnosis not present

## 2020-03-03 DIAGNOSIS — M19042 Primary osteoarthritis, left hand: Secondary | ICD-10-CM | POA: Diagnosis not present

## 2020-03-03 DIAGNOSIS — L089 Local infection of the skin and subcutaneous tissue, unspecified: Secondary | ICD-10-CM | POA: Diagnosis not present

## 2020-03-03 DIAGNOSIS — G589 Mononeuropathy, unspecified: Secondary | ICD-10-CM | POA: Diagnosis not present

## 2020-03-03 DIAGNOSIS — C946 Myelodysplastic disease, not classified: Secondary | ICD-10-CM | POA: Diagnosis not present

## 2020-03-03 DIAGNOSIS — F1010 Alcohol abuse, uncomplicated: Secondary | ICD-10-CM | POA: Diagnosis not present

## 2020-03-03 DIAGNOSIS — Z01419 Encounter for gynecological examination (general) (routine) without abnormal findings: Secondary | ICD-10-CM | POA: Diagnosis not present

## 2020-03-03 DIAGNOSIS — M25661 Stiffness of right knee, not elsewhere classified: Secondary | ICD-10-CM | POA: Diagnosis not present

## 2020-03-03 DIAGNOSIS — D631 Anemia in chronic kidney disease: Secondary | ICD-10-CM | POA: Diagnosis not present

## 2020-03-03 DIAGNOSIS — Z7409 Other reduced mobility: Secondary | ICD-10-CM | POA: Diagnosis not present

## 2020-03-03 DIAGNOSIS — E781 Pure hyperglyceridemia: Secondary | ICD-10-CM | POA: Diagnosis not present

## 2020-03-03 DIAGNOSIS — Z96651 Presence of right artificial knee joint: Secondary | ICD-10-CM | POA: Diagnosis not present

## 2020-03-03 DIAGNOSIS — R432 Parageusia: Secondary | ICD-10-CM | POA: Diagnosis not present

## 2020-03-03 DIAGNOSIS — E6609 Other obesity due to excess calories: Secondary | ICD-10-CM | POA: Diagnosis not present

## 2020-03-03 DIAGNOSIS — J988 Other specified respiratory disorders: Secondary | ICD-10-CM | POA: Diagnosis not present

## 2020-03-03 DIAGNOSIS — H5202 Hypermetropia, left eye: Secondary | ICD-10-CM | POA: Diagnosis not present

## 2020-03-03 DIAGNOSIS — G4736 Sleep related hypoventilation in conditions classified elsewhere: Secondary | ICD-10-CM | POA: Diagnosis not present

## 2020-03-03 DIAGNOSIS — D649 Anemia, unspecified: Secondary | ICD-10-CM | POA: Diagnosis not present

## 2020-03-03 DIAGNOSIS — G4089 Other seizures: Secondary | ICD-10-CM | POA: Diagnosis not present

## 2020-03-03 DIAGNOSIS — S0181XA Laceration without foreign body of other part of head, initial encounter: Secondary | ICD-10-CM | POA: Diagnosis not present

## 2020-03-03 DIAGNOSIS — E46 Unspecified protein-calorie malnutrition: Secondary | ICD-10-CM | POA: Diagnosis not present

## 2020-03-03 DIAGNOSIS — Z8551 Personal history of malignant neoplasm of bladder: Secondary | ICD-10-CM | POA: Diagnosis not present

## 2020-03-03 DIAGNOSIS — R251 Tremor, unspecified: Secondary | ICD-10-CM | POA: Diagnosis not present

## 2020-03-03 DIAGNOSIS — H35423 Microcystoid degeneration of retina, bilateral: Secondary | ICD-10-CM | POA: Diagnosis not present

## 2020-03-03 DIAGNOSIS — N186 End stage renal disease: Secondary | ICD-10-CM | POA: Diagnosis not present

## 2020-03-03 DIAGNOSIS — R590 Localized enlarged lymph nodes: Secondary | ICD-10-CM | POA: Diagnosis not present

## 2020-03-03 DIAGNOSIS — H01011 Ulcerative blepharitis right upper eyelid: Secondary | ICD-10-CM | POA: Diagnosis not present

## 2020-03-03 DIAGNOSIS — M4804 Spinal stenosis, thoracic region: Secondary | ICD-10-CM | POA: Diagnosis not present

## 2020-03-03 DIAGNOSIS — Z1589 Genetic susceptibility to other disease: Secondary | ICD-10-CM | POA: Diagnosis not present

## 2020-03-03 DIAGNOSIS — R062 Wheezing: Secondary | ICD-10-CM | POA: Diagnosis not present

## 2020-03-03 DIAGNOSIS — M25531 Pain in right wrist: Secondary | ICD-10-CM | POA: Diagnosis not present

## 2020-03-03 DIAGNOSIS — F5101 Primary insomnia: Secondary | ICD-10-CM | POA: Diagnosis not present

## 2020-03-03 DIAGNOSIS — I4819 Other persistent atrial fibrillation: Secondary | ICD-10-CM | POA: Diagnosis not present

## 2020-03-03 DIAGNOSIS — J683 Other acute and subacute respiratory conditions due to chemicals, gases, fumes and vapors: Secondary | ICD-10-CM | POA: Diagnosis not present

## 2020-03-03 DIAGNOSIS — S21101A Unspecified open wound of right front wall of thorax without penetration into thoracic cavity, initial encounter: Secondary | ICD-10-CM | POA: Diagnosis not present

## 2020-03-03 DIAGNOSIS — M75111 Incomplete rotator cuff tear or rupture of right shoulder, not specified as traumatic: Secondary | ICD-10-CM | POA: Diagnosis not present

## 2020-03-03 DIAGNOSIS — M25559 Pain in unspecified hip: Secondary | ICD-10-CM | POA: Diagnosis not present

## 2020-03-03 DIAGNOSIS — Z1272 Encounter for screening for malignant neoplasm of vagina: Secondary | ICD-10-CM | POA: Diagnosis not present

## 2020-03-03 DIAGNOSIS — A499 Bacterial infection, unspecified: Secondary | ICD-10-CM | POA: Diagnosis not present

## 2020-03-03 DIAGNOSIS — Z791 Long term (current) use of non-steroidal anti-inflammatories (NSAID): Secondary | ICD-10-CM | POA: Diagnosis not present

## 2020-03-03 DIAGNOSIS — M1811 Unilateral primary osteoarthritis of first carpometacarpal joint, right hand: Secondary | ICD-10-CM | POA: Diagnosis not present

## 2020-03-03 DIAGNOSIS — I890 Lymphedema, not elsewhere classified: Secondary | ICD-10-CM | POA: Diagnosis not present

## 2020-03-03 DIAGNOSIS — R221 Localized swelling, mass and lump, neck: Secondary | ICD-10-CM | POA: Diagnosis not present

## 2020-03-03 DIAGNOSIS — S32501A Unspecified fracture of right pubis, initial encounter for closed fracture: Secondary | ICD-10-CM | POA: Diagnosis not present

## 2020-03-03 DIAGNOSIS — M25641 Stiffness of right hand, not elsewhere classified: Secondary | ICD-10-CM | POA: Diagnosis not present

## 2020-03-03 DIAGNOSIS — G4700 Insomnia, unspecified: Secondary | ICD-10-CM | POA: Diagnosis not present

## 2020-03-03 DIAGNOSIS — S51801A Unspecified open wound of right forearm, initial encounter: Secondary | ICD-10-CM | POA: Diagnosis not present

## 2020-03-03 DIAGNOSIS — M7061 Trochanteric bursitis, right hip: Secondary | ICD-10-CM | POA: Diagnosis not present

## 2020-03-03 DIAGNOSIS — M8589 Other specified disorders of bone density and structure, multiple sites: Secondary | ICD-10-CM | POA: Diagnosis not present

## 2020-03-03 DIAGNOSIS — I130 Hypertensive heart and chronic kidney disease with heart failure and stage 1 through stage 4 chronic kidney disease, or unspecified chronic kidney disease: Secondary | ICD-10-CM | POA: Diagnosis not present

## 2020-03-03 DIAGNOSIS — H25043 Posterior subcapsular polar age-related cataract, bilateral: Secondary | ICD-10-CM | POA: Diagnosis not present

## 2020-03-03 DIAGNOSIS — G35 Multiple sclerosis: Secondary | ICD-10-CM | POA: Diagnosis not present

## 2020-03-03 DIAGNOSIS — B029 Zoster without complications: Secondary | ICD-10-CM | POA: Diagnosis not present

## 2020-03-03 DIAGNOSIS — L820 Inflamed seborrheic keratosis: Secondary | ICD-10-CM | POA: Diagnosis not present

## 2020-03-03 DIAGNOSIS — B351 Tinea unguium: Secondary | ICD-10-CM | POA: Diagnosis not present

## 2020-03-03 DIAGNOSIS — Z9011 Acquired absence of right breast and nipple: Secondary | ICD-10-CM | POA: Diagnosis not present

## 2020-03-03 DIAGNOSIS — M542 Cervicalgia: Secondary | ICD-10-CM | POA: Diagnosis not present

## 2020-03-03 DIAGNOSIS — K5790 Diverticulosis of intestine, part unspecified, without perforation or abscess without bleeding: Secondary | ICD-10-CM | POA: Diagnosis not present

## 2020-03-03 DIAGNOSIS — M25622 Stiffness of left elbow, not elsewhere classified: Secondary | ICD-10-CM | POA: Diagnosis not present

## 2020-03-03 DIAGNOSIS — H9192 Unspecified hearing loss, left ear: Secondary | ICD-10-CM | POA: Diagnosis not present

## 2020-03-03 DIAGNOSIS — M25642 Stiffness of left hand, not elsewhere classified: Secondary | ICD-10-CM | POA: Diagnosis not present

## 2020-03-03 DIAGNOSIS — E0500 Thyrotoxicosis with diffuse goiter without thyrotoxic crisis or storm: Secondary | ICD-10-CM | POA: Diagnosis not present

## 2020-03-03 DIAGNOSIS — F17210 Nicotine dependence, cigarettes, uncomplicated: Secondary | ICD-10-CM | POA: Diagnosis not present

## 2020-03-03 DIAGNOSIS — E109 Type 1 diabetes mellitus without complications: Secondary | ICD-10-CM | POA: Diagnosis not present

## 2020-03-03 DIAGNOSIS — R197 Diarrhea, unspecified: Secondary | ICD-10-CM | POA: Diagnosis not present

## 2020-03-03 DIAGNOSIS — R400 Somnolence: Secondary | ICD-10-CM | POA: Diagnosis not present

## 2020-03-03 DIAGNOSIS — F4323 Adjustment disorder with mixed anxiety and depressed mood: Secondary | ICD-10-CM | POA: Diagnosis not present

## 2020-03-03 DIAGNOSIS — N9089 Other specified noninflammatory disorders of vulva and perineum: Secondary | ICD-10-CM | POA: Diagnosis not present

## 2020-03-03 DIAGNOSIS — R6520 Severe sepsis without septic shock: Secondary | ICD-10-CM | POA: Diagnosis not present

## 2020-03-03 DIAGNOSIS — L851 Acquired keratosis [keratoderma] palmaris et plantaris: Secondary | ICD-10-CM | POA: Diagnosis not present

## 2020-03-03 DIAGNOSIS — R339 Retention of urine, unspecified: Secondary | ICD-10-CM | POA: Diagnosis not present

## 2020-03-03 DIAGNOSIS — N4883 Acquired buried penis: Secondary | ICD-10-CM | POA: Diagnosis not present

## 2020-03-03 DIAGNOSIS — K660 Peritoneal adhesions (postprocedural) (postinfection): Secondary | ICD-10-CM | POA: Diagnosis not present

## 2020-03-03 DIAGNOSIS — J90 Pleural effusion, not elsewhere classified: Secondary | ICD-10-CM | POA: Diagnosis not present

## 2020-03-03 DIAGNOSIS — R569 Unspecified convulsions: Secondary | ICD-10-CM | POA: Diagnosis not present

## 2020-03-03 DIAGNOSIS — I341 Nonrheumatic mitral (valve) prolapse: Secondary | ICD-10-CM | POA: Diagnosis not present

## 2020-03-03 DIAGNOSIS — E1149 Type 2 diabetes mellitus with other diabetic neurological complication: Secondary | ICD-10-CM | POA: Diagnosis not present

## 2020-03-03 DIAGNOSIS — K5900 Constipation, unspecified: Secondary | ICD-10-CM | POA: Diagnosis not present

## 2020-03-03 DIAGNOSIS — S51812A Laceration without foreign body of left forearm, initial encounter: Secondary | ICD-10-CM | POA: Diagnosis not present

## 2020-03-03 DIAGNOSIS — R05 Cough: Secondary | ICD-10-CM | POA: Diagnosis not present

## 2020-03-03 DIAGNOSIS — D2262 Melanocytic nevi of left upper limb, including shoulder: Secondary | ICD-10-CM | POA: Diagnosis not present

## 2020-03-03 DIAGNOSIS — D6949 Other primary thrombocytopenia: Secondary | ICD-10-CM | POA: Diagnosis not present

## 2020-03-03 DIAGNOSIS — Z20828 Contact with and (suspected) exposure to other viral communicable diseases: Secondary | ICD-10-CM | POA: Diagnosis not present

## 2020-03-03 DIAGNOSIS — I82443 Acute embolism and thrombosis of tibial vein, bilateral: Secondary | ICD-10-CM | POA: Diagnosis not present

## 2020-03-03 DIAGNOSIS — G546 Phantom limb syndrome with pain: Secondary | ICD-10-CM | POA: Diagnosis not present

## 2020-03-03 DIAGNOSIS — H2513 Age-related nuclear cataract, bilateral: Secondary | ICD-10-CM | POA: Diagnosis not present

## 2020-03-03 DIAGNOSIS — D127 Benign neoplasm of rectosigmoid junction: Secondary | ICD-10-CM | POA: Diagnosis not present

## 2020-03-03 DIAGNOSIS — D0439 Carcinoma in situ of skin of other parts of face: Secondary | ICD-10-CM | POA: Diagnosis not present

## 2020-03-03 DIAGNOSIS — R911 Solitary pulmonary nodule: Secondary | ICD-10-CM | POA: Diagnosis not present

## 2020-03-03 DIAGNOSIS — Z8679 Personal history of other diseases of the circulatory system: Secondary | ICD-10-CM | POA: Diagnosis not present

## 2020-03-03 DIAGNOSIS — J189 Pneumonia, unspecified organism: Secondary | ICD-10-CM | POA: Diagnosis not present

## 2020-03-03 DIAGNOSIS — E0590 Thyrotoxicosis, unspecified without thyrotoxic crisis or storm: Secondary | ICD-10-CM | POA: Diagnosis not present

## 2020-03-03 DIAGNOSIS — M8580 Other specified disorders of bone density and structure, unspecified site: Secondary | ICD-10-CM | POA: Diagnosis not present

## 2020-03-03 DIAGNOSIS — J343 Hypertrophy of nasal turbinates: Secondary | ICD-10-CM | POA: Diagnosis not present

## 2020-03-03 DIAGNOSIS — Q541 Hypospadias, penile: Secondary | ICD-10-CM | POA: Diagnosis not present

## 2020-03-03 DIAGNOSIS — N181 Chronic kidney disease, stage 1: Secondary | ICD-10-CM | POA: Diagnosis not present

## 2020-03-03 DIAGNOSIS — M7989 Other specified soft tissue disorders: Secondary | ICD-10-CM | POA: Diagnosis not present

## 2020-03-03 DIAGNOSIS — Z789 Other specified health status: Secondary | ICD-10-CM | POA: Diagnosis not present

## 2020-03-03 DIAGNOSIS — Z08 Encounter for follow-up examination after completed treatment for malignant neoplasm: Secondary | ICD-10-CM | POA: Diagnosis not present

## 2020-03-03 DIAGNOSIS — H35353 Cystoid macular degeneration, bilateral: Secondary | ICD-10-CM | POA: Diagnosis not present

## 2020-03-03 DIAGNOSIS — E440 Moderate protein-calorie malnutrition: Secondary | ICD-10-CM | POA: Diagnosis not present

## 2020-03-03 DIAGNOSIS — H353231 Exudative age-related macular degeneration, bilateral, with active choroidal neovascularization: Secondary | ICD-10-CM | POA: Diagnosis not present

## 2020-03-03 DIAGNOSIS — J310 Chronic rhinitis: Secondary | ICD-10-CM | POA: Diagnosis not present

## 2020-03-03 DIAGNOSIS — F909 Attention-deficit hyperactivity disorder, unspecified type: Secondary | ICD-10-CM | POA: Diagnosis not present

## 2020-03-03 DIAGNOSIS — J324 Chronic pansinusitis: Secondary | ICD-10-CM | POA: Diagnosis not present

## 2020-03-03 DIAGNOSIS — N401 Enlarged prostate with lower urinary tract symptoms: Secondary | ICD-10-CM | POA: Diagnosis not present

## 2020-03-03 DIAGNOSIS — C4492 Squamous cell carcinoma of skin, unspecified: Secondary | ICD-10-CM | POA: Diagnosis not present

## 2020-03-03 DIAGNOSIS — H40032 Anatomical narrow angle, left eye: Secondary | ICD-10-CM | POA: Diagnosis not present

## 2020-03-03 DIAGNOSIS — K219 Gastro-esophageal reflux disease without esophagitis: Secondary | ICD-10-CM | POA: Diagnosis not present

## 2020-03-03 DIAGNOSIS — L859 Epidermal thickening, unspecified: Secondary | ICD-10-CM | POA: Diagnosis not present

## 2020-03-03 DIAGNOSIS — F3341 Major depressive disorder, recurrent, in partial remission: Secondary | ICD-10-CM | POA: Diagnosis not present

## 2020-03-03 DIAGNOSIS — N1330 Unspecified hydronephrosis: Secondary | ICD-10-CM | POA: Diagnosis not present

## 2020-03-03 DIAGNOSIS — Z538 Procedure and treatment not carried out for other reasons: Secondary | ICD-10-CM | POA: Diagnosis not present

## 2020-03-03 DIAGNOSIS — M25331 Other instability, right wrist: Secondary | ICD-10-CM | POA: Diagnosis not present

## 2020-03-03 DIAGNOSIS — S338XXA Sprain of other parts of lumbar spine and pelvis, initial encounter: Secondary | ICD-10-CM | POA: Diagnosis not present

## 2020-03-03 DIAGNOSIS — I493 Ventricular premature depolarization: Secondary | ICD-10-CM | POA: Diagnosis not present

## 2020-03-03 DIAGNOSIS — S0990XA Unspecified injury of head, initial encounter: Secondary | ICD-10-CM | POA: Diagnosis not present

## 2020-03-03 DIAGNOSIS — N3010 Interstitial cystitis (chronic) without hematuria: Secondary | ICD-10-CM | POA: Diagnosis not present

## 2020-03-03 DIAGNOSIS — M7541 Impingement syndrome of right shoulder: Secondary | ICD-10-CM | POA: Diagnosis not present

## 2020-03-03 DIAGNOSIS — M47816 Spondylosis without myelopathy or radiculopathy, lumbar region: Secondary | ICD-10-CM | POA: Diagnosis not present

## 2020-03-03 DIAGNOSIS — M05741 Rheumatoid arthritis with rheumatoid factor of right hand without organ or systems involvement: Secondary | ICD-10-CM | POA: Diagnosis not present

## 2020-03-03 DIAGNOSIS — M064 Inflammatory polyarthropathy: Secondary | ICD-10-CM | POA: Diagnosis not present

## 2020-03-03 DIAGNOSIS — Z1152 Encounter for screening for COVID-19: Secondary | ICD-10-CM | POA: Diagnosis not present

## 2020-03-03 DIAGNOSIS — Z89512 Acquired absence of left leg below knee: Secondary | ICD-10-CM | POA: Diagnosis not present

## 2020-03-03 DIAGNOSIS — M353 Polymyalgia rheumatica: Secondary | ICD-10-CM | POA: Diagnosis not present

## 2020-03-03 DIAGNOSIS — J341 Cyst and mucocele of nose and nasal sinus: Secondary | ICD-10-CM | POA: Diagnosis not present

## 2020-03-03 DIAGNOSIS — Z8719 Personal history of other diseases of the digestive system: Secondary | ICD-10-CM | POA: Diagnosis not present

## 2020-03-03 DIAGNOSIS — J3089 Other allergic rhinitis: Secondary | ICD-10-CM | POA: Diagnosis not present

## 2020-03-03 DIAGNOSIS — H52223 Regular astigmatism, bilateral: Secondary | ICD-10-CM | POA: Diagnosis not present

## 2020-03-03 DIAGNOSIS — I428 Other cardiomyopathies: Secondary | ICD-10-CM | POA: Diagnosis not present

## 2020-03-03 DIAGNOSIS — I440 Atrioventricular block, first degree: Secondary | ICD-10-CM | POA: Diagnosis not present

## 2020-03-03 DIAGNOSIS — R2242 Localized swelling, mass and lump, left lower limb: Secondary | ICD-10-CM | POA: Diagnosis not present

## 2020-03-03 DIAGNOSIS — J301 Allergic rhinitis due to pollen: Secondary | ICD-10-CM | POA: Diagnosis not present

## 2020-03-03 DIAGNOSIS — E876 Hypokalemia: Secondary | ICD-10-CM | POA: Diagnosis not present

## 2020-03-03 DIAGNOSIS — H04123 Dry eye syndrome of bilateral lacrimal glands: Secondary | ICD-10-CM | POA: Diagnosis not present

## 2020-03-03 DIAGNOSIS — Z6831 Body mass index (BMI) 31.0-31.9, adult: Secondary | ICD-10-CM | POA: Diagnosis not present

## 2020-03-03 DIAGNOSIS — M79604 Pain in right leg: Secondary | ICD-10-CM | POA: Diagnosis not present

## 2020-03-03 DIAGNOSIS — Z85820 Personal history of malignant melanoma of skin: Secondary | ICD-10-CM | POA: Diagnosis not present

## 2020-03-03 DIAGNOSIS — I340 Nonrheumatic mitral (valve) insufficiency: Secondary | ICD-10-CM | POA: Diagnosis not present

## 2020-03-03 DIAGNOSIS — N319 Neuromuscular dysfunction of bladder, unspecified: Secondary | ICD-10-CM | POA: Diagnosis not present

## 2020-03-03 DIAGNOSIS — Z923 Personal history of irradiation: Secondary | ICD-10-CM | POA: Diagnosis not present

## 2020-03-03 DIAGNOSIS — E663 Overweight: Secondary | ICD-10-CM | POA: Diagnosis not present

## 2020-03-03 DIAGNOSIS — H35033 Hypertensive retinopathy, bilateral: Secondary | ICD-10-CM | POA: Diagnosis not present

## 2020-03-03 DIAGNOSIS — I70213 Atherosclerosis of native arteries of extremities with intermittent claudication, bilateral legs: Secondary | ICD-10-CM | POA: Diagnosis not present

## 2020-03-03 DIAGNOSIS — K8020 Calculus of gallbladder without cholecystitis without obstruction: Secondary | ICD-10-CM | POA: Diagnosis not present

## 2020-03-03 DIAGNOSIS — Z9049 Acquired absence of other specified parts of digestive tract: Secondary | ICD-10-CM | POA: Diagnosis not present

## 2020-03-03 DIAGNOSIS — K641 Second degree hemorrhoids: Secondary | ICD-10-CM | POA: Diagnosis not present

## 2020-03-03 DIAGNOSIS — R410 Disorientation, unspecified: Secondary | ICD-10-CM | POA: Diagnosis not present

## 2020-03-03 DIAGNOSIS — E113293 Type 2 diabetes mellitus with mild nonproliferative diabetic retinopathy without macular edema, bilateral: Secondary | ICD-10-CM | POA: Diagnosis not present

## 2020-03-03 DIAGNOSIS — H1820 Unspecified corneal edema: Secondary | ICD-10-CM | POA: Diagnosis not present

## 2020-03-03 DIAGNOSIS — F17220 Nicotine dependence, chewing tobacco, uncomplicated: Secondary | ICD-10-CM | POA: Diagnosis not present

## 2020-03-03 DIAGNOSIS — M869 Osteomyelitis, unspecified: Secondary | ICD-10-CM | POA: Diagnosis not present

## 2020-03-03 DIAGNOSIS — I083 Combined rheumatic disorders of mitral, aortic and tricuspid valves: Secondary | ICD-10-CM | POA: Diagnosis not present

## 2020-03-03 DIAGNOSIS — I272 Pulmonary hypertension, unspecified: Secondary | ICD-10-CM | POA: Diagnosis not present

## 2020-03-03 DIAGNOSIS — N958 Other specified menopausal and perimenopausal disorders: Secondary | ICD-10-CM | POA: Diagnosis not present

## 2020-03-03 DIAGNOSIS — I721 Aneurysm of artery of upper extremity: Secondary | ICD-10-CM | POA: Diagnosis not present

## 2020-03-03 DIAGNOSIS — M50123 Cervical disc disorder at C6-C7 level with radiculopathy: Secondary | ICD-10-CM | POA: Diagnosis not present

## 2020-03-03 DIAGNOSIS — M79652 Pain in left thigh: Secondary | ICD-10-CM | POA: Diagnosis not present

## 2020-03-03 DIAGNOSIS — L900 Lichen sclerosus et atrophicus: Secondary | ICD-10-CM | POA: Diagnosis not present

## 2020-03-03 DIAGNOSIS — C651 Malignant neoplasm of right renal pelvis: Secondary | ICD-10-CM | POA: Diagnosis not present

## 2020-03-03 DIAGNOSIS — E8801 Alpha-1-antitrypsin deficiency: Secondary | ICD-10-CM | POA: Diagnosis not present

## 2020-03-03 DIAGNOSIS — Z1151 Encounter for screening for human papillomavirus (HPV): Secondary | ICD-10-CM | POA: Diagnosis not present

## 2020-03-03 DIAGNOSIS — K220 Achalasia of cardia: Secondary | ICD-10-CM | POA: Diagnosis not present

## 2020-03-03 DIAGNOSIS — M19021 Primary osteoarthritis, right elbow: Secondary | ICD-10-CM | POA: Diagnosis not present

## 2020-03-03 DIAGNOSIS — I081 Rheumatic disorders of both mitral and tricuspid valves: Secondary | ICD-10-CM | POA: Diagnosis not present

## 2020-03-03 DIAGNOSIS — I472 Ventricular tachycardia: Secondary | ICD-10-CM | POA: Diagnosis not present

## 2020-03-03 DIAGNOSIS — M8700 Idiopathic aseptic necrosis of unspecified bone: Secondary | ICD-10-CM | POA: Diagnosis not present

## 2020-03-03 DIAGNOSIS — N3946 Mixed incontinence: Secondary | ICD-10-CM | POA: Diagnosis not present

## 2020-03-03 DIAGNOSIS — H95191 Other disorders following mastoidectomy, right ear: Secondary | ICD-10-CM | POA: Diagnosis not present

## 2020-03-03 DIAGNOSIS — J45909 Unspecified asthma, uncomplicated: Secondary | ICD-10-CM | POA: Diagnosis not present

## 2020-03-03 DIAGNOSIS — R109 Unspecified abdominal pain: Secondary | ICD-10-CM | POA: Diagnosis not present

## 2020-03-03 DIAGNOSIS — Z886 Allergy status to analgesic agent status: Secondary | ICD-10-CM | POA: Diagnosis not present

## 2020-03-04 DIAGNOSIS — I252 Old myocardial infarction: Secondary | ICD-10-CM | POA: Diagnosis not present

## 2020-03-04 DIAGNOSIS — M4802 Spinal stenosis, cervical region: Secondary | ICD-10-CM | POA: Diagnosis not present

## 2020-03-04 DIAGNOSIS — S0993XA Unspecified injury of face, initial encounter: Secondary | ICD-10-CM | POA: Diagnosis not present

## 2020-03-04 DIAGNOSIS — R635 Abnormal weight gain: Secondary | ICD-10-CM | POA: Diagnosis not present

## 2020-03-04 DIAGNOSIS — Z8249 Family history of ischemic heart disease and other diseases of the circulatory system: Secondary | ICD-10-CM | POA: Diagnosis not present

## 2020-03-04 DIAGNOSIS — E440 Moderate protein-calorie malnutrition: Secondary | ICD-10-CM | POA: Diagnosis not present

## 2020-03-04 DIAGNOSIS — R079 Chest pain, unspecified: Secondary | ICD-10-CM | POA: Diagnosis not present

## 2020-03-04 DIAGNOSIS — G44209 Tension-type headache, unspecified, not intractable: Secondary | ICD-10-CM | POA: Diagnosis not present

## 2020-03-04 DIAGNOSIS — M6281 Muscle weakness (generalized): Secondary | ICD-10-CM | POA: Diagnosis not present

## 2020-03-04 DIAGNOSIS — Z720 Tobacco use: Secondary | ICD-10-CM | POA: Diagnosis not present

## 2020-03-04 DIAGNOSIS — Z01812 Encounter for preprocedural laboratory examination: Secondary | ICD-10-CM | POA: Diagnosis not present

## 2020-03-04 DIAGNOSIS — F17220 Nicotine dependence, chewing tobacco, uncomplicated: Secondary | ICD-10-CM | POA: Diagnosis not present

## 2020-03-04 DIAGNOSIS — Z89612 Acquired absence of left leg above knee: Secondary | ICD-10-CM | POA: Diagnosis not present

## 2020-03-04 DIAGNOSIS — R946 Abnormal results of thyroid function studies: Secondary | ICD-10-CM | POA: Diagnosis not present

## 2020-03-04 DIAGNOSIS — Z139 Encounter for screening, unspecified: Secondary | ICD-10-CM | POA: Diagnosis not present

## 2020-03-04 DIAGNOSIS — R350 Frequency of micturition: Secondary | ICD-10-CM | POA: Diagnosis not present

## 2020-03-04 DIAGNOSIS — Z7409 Other reduced mobility: Secondary | ICD-10-CM | POA: Diagnosis not present

## 2020-03-04 DIAGNOSIS — M542 Cervicalgia: Secondary | ICD-10-CM | POA: Diagnosis not present

## 2020-03-04 DIAGNOSIS — Z885 Allergy status to narcotic agent status: Secondary | ICD-10-CM | POA: Diagnosis not present

## 2020-03-04 DIAGNOSIS — J90 Pleural effusion, not elsewhere classified: Secondary | ICD-10-CM | POA: Diagnosis not present

## 2020-03-04 DIAGNOSIS — F5101 Primary insomnia: Secondary | ICD-10-CM | POA: Diagnosis not present

## 2020-03-04 DIAGNOSIS — M48062 Spinal stenosis, lumbar region with neurogenic claudication: Secondary | ICD-10-CM | POA: Diagnosis not present

## 2020-03-04 DIAGNOSIS — S7001XA Contusion of right hip, initial encounter: Secondary | ICD-10-CM | POA: Diagnosis not present

## 2020-03-04 DIAGNOSIS — G8191 Hemiplegia, unspecified affecting right dominant side: Secondary | ICD-10-CM | POA: Diagnosis not present

## 2020-03-04 DIAGNOSIS — I480 Paroxysmal atrial fibrillation: Secondary | ICD-10-CM | POA: Diagnosis not present

## 2020-03-04 DIAGNOSIS — L989 Disorder of the skin and subcutaneous tissue, unspecified: Secondary | ICD-10-CM | POA: Diagnosis not present

## 2020-03-04 DIAGNOSIS — U071 COVID-19: Secondary | ICD-10-CM | POA: Diagnosis not present

## 2020-03-04 DIAGNOSIS — C61 Malignant neoplasm of prostate: Secondary | ICD-10-CM | POA: Diagnosis not present

## 2020-03-04 DIAGNOSIS — R000 Tachycardia, unspecified: Secondary | ICD-10-CM | POA: Diagnosis not present

## 2020-03-04 DIAGNOSIS — E512 Wernicke's encephalopathy: Secondary | ICD-10-CM | POA: Diagnosis not present

## 2020-03-04 DIAGNOSIS — M25531 Pain in right wrist: Secondary | ICD-10-CM | POA: Diagnosis not present

## 2020-03-04 DIAGNOSIS — N400 Enlarged prostate without lower urinary tract symptoms: Secondary | ICD-10-CM | POA: Diagnosis not present

## 2020-03-04 DIAGNOSIS — E559 Vitamin D deficiency, unspecified: Secondary | ICD-10-CM | POA: Diagnosis not present

## 2020-03-04 DIAGNOSIS — W19XXXA Unspecified fall, initial encounter: Secondary | ICD-10-CM | POA: Diagnosis not present

## 2020-03-04 DIAGNOSIS — S3992XA Unspecified injury of lower back, initial encounter: Secondary | ICD-10-CM | POA: Diagnosis not present

## 2020-03-04 DIAGNOSIS — Z853 Personal history of malignant neoplasm of breast: Secondary | ICD-10-CM | POA: Diagnosis not present

## 2020-03-04 DIAGNOSIS — M549 Dorsalgia, unspecified: Secondary | ICD-10-CM | POA: Diagnosis not present

## 2020-03-04 DIAGNOSIS — Z6820 Body mass index (BMI) 20.0-20.9, adult: Secondary | ICD-10-CM | POA: Diagnosis not present

## 2020-03-04 DIAGNOSIS — R0981 Nasal congestion: Secondary | ICD-10-CM | POA: Diagnosis not present

## 2020-03-04 DIAGNOSIS — J189 Pneumonia, unspecified organism: Secondary | ICD-10-CM | POA: Diagnosis not present

## 2020-03-04 DIAGNOSIS — N189 Chronic kidney disease, unspecified: Secondary | ICD-10-CM | POA: Diagnosis not present

## 2020-03-04 DIAGNOSIS — B9689 Other specified bacterial agents as the cause of diseases classified elsewhere: Secondary | ICD-10-CM | POA: Diagnosis not present

## 2020-03-04 DIAGNOSIS — I491 Atrial premature depolarization: Secondary | ICD-10-CM | POA: Diagnosis not present

## 2020-03-04 DIAGNOSIS — R9431 Abnormal electrocardiogram [ECG] [EKG]: Secondary | ICD-10-CM | POA: Diagnosis not present

## 2020-03-04 DIAGNOSIS — J479 Bronchiectasis, uncomplicated: Secondary | ICD-10-CM | POA: Diagnosis not present

## 2020-03-04 DIAGNOSIS — J9600 Acute respiratory failure, unspecified whether with hypoxia or hypercapnia: Secondary | ICD-10-CM | POA: Diagnosis not present

## 2020-03-04 DIAGNOSIS — I272 Pulmonary hypertension, unspecified: Secondary | ICD-10-CM | POA: Diagnosis not present

## 2020-03-04 DIAGNOSIS — Z791 Long term (current) use of non-steroidal anti-inflammatories (NSAID): Secondary | ICD-10-CM | POA: Diagnosis not present

## 2020-03-04 DIAGNOSIS — R29898 Other symptoms and signs involving the musculoskeletal system: Secondary | ICD-10-CM | POA: Diagnosis not present

## 2020-03-04 DIAGNOSIS — I088 Other rheumatic multiple valve diseases: Secondary | ICD-10-CM | POA: Diagnosis not present

## 2020-03-04 DIAGNOSIS — R29818 Other symptoms and signs involving the nervous system: Secondary | ICD-10-CM | POA: Diagnosis not present

## 2020-03-04 DIAGNOSIS — S0181XD Laceration without foreign body of other part of head, subsequent encounter: Secondary | ICD-10-CM | POA: Diagnosis not present

## 2020-03-04 DIAGNOSIS — R109 Unspecified abdominal pain: Secondary | ICD-10-CM | POA: Diagnosis not present

## 2020-03-04 DIAGNOSIS — K810 Acute cholecystitis: Secondary | ICD-10-CM | POA: Diagnosis not present

## 2020-03-04 DIAGNOSIS — R52 Pain, unspecified: Secondary | ICD-10-CM | POA: Diagnosis not present

## 2020-03-04 DIAGNOSIS — M545 Low back pain: Secondary | ICD-10-CM | POA: Diagnosis not present

## 2020-03-04 DIAGNOSIS — M5126 Other intervertebral disc displacement, lumbar region: Secondary | ICD-10-CM | POA: Diagnosis not present

## 2020-03-04 DIAGNOSIS — Z7984 Long term (current) use of oral hypoglycemic drugs: Secondary | ICD-10-CM | POA: Diagnosis not present

## 2020-03-04 DIAGNOSIS — R410 Disorientation, unspecified: Secondary | ICD-10-CM | POA: Diagnosis not present

## 2020-03-04 DIAGNOSIS — I6602 Occlusion and stenosis of left middle cerebral artery: Secondary | ICD-10-CM | POA: Diagnosis not present

## 2020-03-04 DIAGNOSIS — G459 Transient cerebral ischemic attack, unspecified: Secondary | ICD-10-CM | POA: Diagnosis not present

## 2020-03-04 DIAGNOSIS — Z1159 Encounter for screening for other viral diseases: Secondary | ICD-10-CM | POA: Diagnosis not present

## 2020-03-04 DIAGNOSIS — M5136 Other intervertebral disc degeneration, lumbar region: Secondary | ICD-10-CM | POA: Diagnosis not present

## 2020-03-04 DIAGNOSIS — I69398 Other sequelae of cerebral infarction: Secondary | ICD-10-CM | POA: Diagnosis not present

## 2020-03-04 DIAGNOSIS — Z96652 Presence of left artificial knee joint: Secondary | ICD-10-CM | POA: Diagnosis not present

## 2020-03-04 DIAGNOSIS — S6992XA Unspecified injury of left wrist, hand and finger(s), initial encounter: Secondary | ICD-10-CM | POA: Diagnosis not present

## 2020-03-04 DIAGNOSIS — R55 Syncope and collapse: Secondary | ICD-10-CM | POA: Diagnosis not present

## 2020-03-04 DIAGNOSIS — I69318 Other symptoms and signs involving cognitive functions following cerebral infarction: Secondary | ICD-10-CM | POA: Diagnosis not present

## 2020-03-04 DIAGNOSIS — N3000 Acute cystitis without hematuria: Secondary | ICD-10-CM | POA: Diagnosis not present

## 2020-03-04 DIAGNOSIS — R072 Precordial pain: Secondary | ICD-10-CM | POA: Diagnosis not present

## 2020-03-04 DIAGNOSIS — Z1231 Encounter for screening mammogram for malignant neoplasm of breast: Secondary | ICD-10-CM | POA: Diagnosis not present

## 2020-03-04 DIAGNOSIS — F319 Bipolar disorder, unspecified: Secondary | ICD-10-CM | POA: Diagnosis not present

## 2020-03-04 DIAGNOSIS — I129 Hypertensive chronic kidney disease with stage 1 through stage 4 chronic kidney disease, or unspecified chronic kidney disease: Secondary | ICD-10-CM | POA: Diagnosis not present

## 2020-03-04 DIAGNOSIS — I5033 Acute on chronic diastolic (congestive) heart failure: Secondary | ICD-10-CM | POA: Diagnosis not present

## 2020-03-04 DIAGNOSIS — J441 Chronic obstructive pulmonary disease with (acute) exacerbation: Secondary | ICD-10-CM | POA: Diagnosis not present

## 2020-03-04 DIAGNOSIS — R600 Localized edema: Secondary | ICD-10-CM | POA: Diagnosis not present

## 2020-03-04 DIAGNOSIS — I132 Hypertensive heart and chronic kidney disease with heart failure and with stage 5 chronic kidney disease, or end stage renal disease: Secondary | ICD-10-CM | POA: Diagnosis not present

## 2020-03-04 DIAGNOSIS — Z954 Presence of other heart-valve replacement: Secondary | ICD-10-CM | POA: Diagnosis not present

## 2020-03-04 DIAGNOSIS — R001 Bradycardia, unspecified: Secondary | ICD-10-CM | POA: Diagnosis not present

## 2020-03-04 DIAGNOSIS — R911 Solitary pulmonary nodule: Secondary | ICD-10-CM | POA: Diagnosis not present

## 2020-03-04 DIAGNOSIS — E785 Hyperlipidemia, unspecified: Secondary | ICD-10-CM | POA: Diagnosis not present

## 2020-03-04 DIAGNOSIS — R404 Transient alteration of awareness: Secondary | ICD-10-CM | POA: Diagnosis not present

## 2020-03-04 DIAGNOSIS — J302 Other seasonal allergic rhinitis: Secondary | ICD-10-CM | POA: Diagnosis not present

## 2020-03-04 DIAGNOSIS — I5031 Acute diastolic (congestive) heart failure: Secondary | ICD-10-CM | POA: Diagnosis not present

## 2020-03-04 DIAGNOSIS — R7301 Impaired fasting glucose: Secondary | ICD-10-CM | POA: Diagnosis not present

## 2020-03-04 DIAGNOSIS — Z884 Allergy status to anesthetic agent status: Secondary | ICD-10-CM | POA: Diagnosis not present

## 2020-03-04 DIAGNOSIS — R6520 Severe sepsis without septic shock: Secondary | ICD-10-CM | POA: Diagnosis not present

## 2020-03-04 DIAGNOSIS — M47817 Spondylosis without myelopathy or radiculopathy, lumbosacral region: Secondary | ICD-10-CM | POA: Diagnosis not present

## 2020-03-04 DIAGNOSIS — Z20822 Contact with and (suspected) exposure to covid-19: Secondary | ICD-10-CM | POA: Diagnosis not present

## 2020-03-04 DIAGNOSIS — I4891 Unspecified atrial fibrillation: Secondary | ICD-10-CM | POA: Diagnosis not present

## 2020-03-04 DIAGNOSIS — R7989 Other specified abnormal findings of blood chemistry: Secondary | ICD-10-CM | POA: Diagnosis not present

## 2020-03-04 DIAGNOSIS — I110 Hypertensive heart disease with heart failure: Secondary | ICD-10-CM | POA: Diagnosis not present

## 2020-03-04 DIAGNOSIS — I951 Orthostatic hypotension: Secondary | ICD-10-CM | POA: Diagnosis not present

## 2020-03-04 DIAGNOSIS — I10 Essential (primary) hypertension: Secondary | ICD-10-CM | POA: Diagnosis not present

## 2020-03-04 DIAGNOSIS — F419 Anxiety disorder, unspecified: Secondary | ICD-10-CM | POA: Diagnosis not present

## 2020-03-04 DIAGNOSIS — I4510 Unspecified right bundle-branch block: Secondary | ICD-10-CM | POA: Diagnosis not present

## 2020-03-04 DIAGNOSIS — M5002 Cervical disc disorder with myelopathy, mid-cervical region, unspecified level: Secondary | ICD-10-CM | POA: Diagnosis not present

## 2020-03-04 DIAGNOSIS — J810 Acute pulmonary edema: Secondary | ICD-10-CM | POA: Diagnosis not present

## 2020-03-04 DIAGNOSIS — R0781 Pleurodynia: Secondary | ICD-10-CM | POA: Diagnosis not present

## 2020-03-04 DIAGNOSIS — R310 Gross hematuria: Secondary | ICD-10-CM | POA: Diagnosis not present

## 2020-03-04 DIAGNOSIS — E2740 Unspecified adrenocortical insufficiency: Secondary | ICD-10-CM | POA: Diagnosis not present

## 2020-03-04 DIAGNOSIS — M5442 Lumbago with sciatica, left side: Secondary | ICD-10-CM | POA: Diagnosis not present

## 2020-03-04 DIAGNOSIS — R0902 Hypoxemia: Secondary | ICD-10-CM | POA: Diagnosis not present

## 2020-03-04 DIAGNOSIS — Z923 Personal history of irradiation: Secondary | ICD-10-CM | POA: Diagnosis not present

## 2020-03-04 DIAGNOSIS — S0081XA Abrasion of other part of head, initial encounter: Secondary | ICD-10-CM | POA: Diagnosis not present

## 2020-03-04 DIAGNOSIS — Z96653 Presence of artificial knee joint, bilateral: Secondary | ICD-10-CM | POA: Diagnosis not present

## 2020-03-04 DIAGNOSIS — I6522 Occlusion and stenosis of left carotid artery: Secondary | ICD-10-CM | POA: Diagnosis not present

## 2020-03-04 DIAGNOSIS — R531 Weakness: Secondary | ICD-10-CM | POA: Diagnosis not present

## 2020-03-04 DIAGNOSIS — D61818 Other pancytopenia: Secondary | ICD-10-CM | POA: Diagnosis not present

## 2020-03-04 DIAGNOSIS — G4733 Obstructive sleep apnea (adult) (pediatric): Secondary | ICD-10-CM | POA: Diagnosis not present

## 2020-03-04 DIAGNOSIS — R627 Adult failure to thrive: Secondary | ICD-10-CM | POA: Diagnosis not present

## 2020-03-04 DIAGNOSIS — I82811 Embolism and thrombosis of superficial veins of right lower extremities: Secondary | ICD-10-CM | POA: Diagnosis not present

## 2020-03-04 DIAGNOSIS — I700 Atherosclerosis of aorta: Secondary | ICD-10-CM | POA: Diagnosis not present

## 2020-03-04 DIAGNOSIS — M810 Age-related osteoporosis without current pathological fracture: Secondary | ICD-10-CM | POA: Diagnosis not present

## 2020-03-04 DIAGNOSIS — Z23 Encounter for immunization: Secondary | ICD-10-CM | POA: Diagnosis not present

## 2020-03-04 DIAGNOSIS — R1084 Generalized abdominal pain: Secondary | ICD-10-CM | POA: Diagnosis not present

## 2020-03-04 DIAGNOSIS — T40415A Adverse effect of fentanyl or fentanyl analogs, initial encounter: Secondary | ICD-10-CM | POA: Diagnosis not present

## 2020-03-04 DIAGNOSIS — Z96612 Presence of left artificial shoulder joint: Secondary | ICD-10-CM | POA: Diagnosis not present

## 2020-03-04 DIAGNOSIS — I495 Sick sinus syndrome: Secondary | ICD-10-CM | POA: Diagnosis not present

## 2020-03-04 DIAGNOSIS — G894 Chronic pain syndrome: Secondary | ICD-10-CM | POA: Diagnosis not present

## 2020-03-04 DIAGNOSIS — I890 Lymphedema, not elsewhere classified: Secondary | ICD-10-CM | POA: Diagnosis not present

## 2020-03-04 DIAGNOSIS — K819 Cholecystitis, unspecified: Secondary | ICD-10-CM | POA: Diagnosis not present

## 2020-03-04 DIAGNOSIS — I69354 Hemiplegia and hemiparesis following cerebral infarction affecting left non-dominant side: Secondary | ICD-10-CM | POA: Diagnosis not present

## 2020-03-04 DIAGNOSIS — E119 Type 2 diabetes mellitus without complications: Secondary | ICD-10-CM | POA: Diagnosis not present

## 2020-03-04 DIAGNOSIS — M8580 Other specified disorders of bone density and structure, unspecified site: Secondary | ICD-10-CM | POA: Diagnosis not present

## 2020-03-04 DIAGNOSIS — J9610 Chronic respiratory failure, unspecified whether with hypoxia or hypercapnia: Secondary | ICD-10-CM | POA: Diagnosis not present

## 2020-03-04 DIAGNOSIS — G2581 Restless legs syndrome: Secondary | ICD-10-CM | POA: Diagnosis not present

## 2020-03-04 DIAGNOSIS — H2513 Age-related nuclear cataract, bilateral: Secondary | ICD-10-CM | POA: Diagnosis not present

## 2020-03-04 DIAGNOSIS — N390 Urinary tract infection, site not specified: Secondary | ICD-10-CM | POA: Diagnosis not present

## 2020-03-04 DIAGNOSIS — E871 Hypo-osmolality and hyponatremia: Secondary | ICD-10-CM | POA: Diagnosis not present

## 2020-03-04 DIAGNOSIS — J1282 Pneumonia due to coronavirus disease 2019: Secondary | ICD-10-CM | POA: Diagnosis not present

## 2020-03-04 DIAGNOSIS — M19041 Primary osteoarthritis, right hand: Secondary | ICD-10-CM | POA: Diagnosis not present

## 2020-03-04 DIAGNOSIS — I4821 Permanent atrial fibrillation: Secondary | ICD-10-CM | POA: Diagnosis not present

## 2020-03-04 DIAGNOSIS — F0390 Unspecified dementia without behavioral disturbance: Secondary | ICD-10-CM | POA: Diagnosis not present

## 2020-03-04 DIAGNOSIS — J449 Chronic obstructive pulmonary disease, unspecified: Secondary | ICD-10-CM | POA: Diagnosis not present

## 2020-03-04 DIAGNOSIS — Z0000 Encounter for general adult medical examination without abnormal findings: Secondary | ICD-10-CM | POA: Diagnosis not present

## 2020-03-04 DIAGNOSIS — E1149 Type 2 diabetes mellitus with other diabetic neurological complication: Secondary | ICD-10-CM | POA: Diagnosis not present

## 2020-03-04 DIAGNOSIS — Z87891 Personal history of nicotine dependence: Secondary | ICD-10-CM | POA: Diagnosis not present

## 2020-03-04 DIAGNOSIS — R5383 Other fatigue: Secondary | ICD-10-CM | POA: Diagnosis not present

## 2020-03-04 DIAGNOSIS — K8020 Calculus of gallbladder without cholecystitis without obstruction: Secondary | ICD-10-CM | POA: Diagnosis not present

## 2020-03-04 DIAGNOSIS — R1319 Other dysphagia: Secondary | ICD-10-CM | POA: Diagnosis not present

## 2020-03-04 DIAGNOSIS — F3342 Major depressive disorder, recurrent, in full remission: Secondary | ICD-10-CM | POA: Diagnosis not present

## 2020-03-04 DIAGNOSIS — N183 Chronic kidney disease, stage 3 unspecified: Secondary | ICD-10-CM | POA: Diagnosis not present

## 2020-03-04 DIAGNOSIS — K5900 Constipation, unspecified: Secondary | ICD-10-CM | POA: Diagnosis not present

## 2020-03-04 DIAGNOSIS — A499 Bacterial infection, unspecified: Secondary | ICD-10-CM | POA: Diagnosis not present

## 2020-03-04 DIAGNOSIS — R069 Unspecified abnormalities of breathing: Secondary | ICD-10-CM | POA: Diagnosis not present

## 2020-03-04 DIAGNOSIS — S299XXA Unspecified injury of thorax, initial encounter: Secondary | ICD-10-CM | POA: Diagnosis not present

## 2020-03-04 DIAGNOSIS — M5489 Other dorsalgia: Secondary | ICD-10-CM | POA: Diagnosis not present

## 2020-03-04 DIAGNOSIS — J84112 Idiopathic pulmonary fibrosis: Secondary | ICD-10-CM | POA: Diagnosis not present

## 2020-03-04 DIAGNOSIS — K5730 Diverticulosis of large intestine without perforation or abscess without bleeding: Secondary | ICD-10-CM | POA: Diagnosis not present

## 2020-03-04 DIAGNOSIS — Z955 Presence of coronary angioplasty implant and graft: Secondary | ICD-10-CM | POA: Diagnosis not present

## 2020-03-04 DIAGNOSIS — S62165A Nondisplaced fracture of pisiform, left wrist, initial encounter for closed fracture: Secondary | ICD-10-CM | POA: Diagnosis not present

## 2020-03-04 DIAGNOSIS — E782 Mixed hyperlipidemia: Secondary | ICD-10-CM | POA: Diagnosis not present

## 2020-03-04 DIAGNOSIS — Z7902 Long term (current) use of antithrombotics/antiplatelets: Secondary | ICD-10-CM | POA: Diagnosis not present

## 2020-03-04 DIAGNOSIS — F17210 Nicotine dependence, cigarettes, uncomplicated: Secondary | ICD-10-CM | POA: Diagnosis not present

## 2020-03-04 DIAGNOSIS — D473 Essential (hemorrhagic) thrombocythemia: Secondary | ICD-10-CM | POA: Diagnosis not present

## 2020-03-04 DIAGNOSIS — I083 Combined rheumatic disorders of mitral, aortic and tricuspid valves: Secondary | ICD-10-CM | POA: Diagnosis not present

## 2020-03-04 DIAGNOSIS — R319 Hematuria, unspecified: Secondary | ICD-10-CM | POA: Diagnosis not present

## 2020-03-04 DIAGNOSIS — J80 Acute respiratory distress syndrome: Secondary | ICD-10-CM | POA: Diagnosis not present

## 2020-03-04 DIAGNOSIS — E7800 Pure hypercholesterolemia, unspecified: Secondary | ICD-10-CM | POA: Diagnosis not present

## 2020-03-04 DIAGNOSIS — Z6830 Body mass index (BMI) 30.0-30.9, adult: Secondary | ICD-10-CM | POA: Diagnosis not present

## 2020-03-04 DIAGNOSIS — R21 Rash and other nonspecific skin eruption: Secondary | ICD-10-CM | POA: Diagnosis not present

## 2020-03-04 DIAGNOSIS — R1033 Periumbilical pain: Secondary | ICD-10-CM | POA: Diagnosis not present

## 2020-03-04 DIAGNOSIS — J432 Centrilobular emphysema: Secondary | ICD-10-CM | POA: Diagnosis not present

## 2020-03-04 DIAGNOSIS — S72491A Other fracture of lower end of right femur, initial encounter for closed fracture: Secondary | ICD-10-CM | POA: Diagnosis not present

## 2020-03-04 DIAGNOSIS — M1711 Unilateral primary osteoarthritis, right knee: Secondary | ICD-10-CM | POA: Diagnosis not present

## 2020-03-04 DIAGNOSIS — Z7951 Long term (current) use of inhaled steroids: Secondary | ICD-10-CM | POA: Diagnosis not present

## 2020-03-04 DIAGNOSIS — R197 Diarrhea, unspecified: Secondary | ICD-10-CM | POA: Diagnosis not present

## 2020-03-04 DIAGNOSIS — R1310 Dysphagia, unspecified: Secondary | ICD-10-CM | POA: Diagnosis not present

## 2020-03-04 DIAGNOSIS — F0150 Vascular dementia without behavioral disturbance: Secondary | ICD-10-CM | POA: Diagnosis not present

## 2020-03-04 DIAGNOSIS — R3129 Other microscopic hematuria: Secondary | ICD-10-CM | POA: Diagnosis not present

## 2020-03-04 DIAGNOSIS — I5032 Chronic diastolic (congestive) heart failure: Secondary | ICD-10-CM | POA: Diagnosis not present

## 2020-03-04 DIAGNOSIS — Z951 Presence of aortocoronary bypass graft: Secondary | ICD-10-CM | POA: Diagnosis not present

## 2020-03-04 DIAGNOSIS — Z8673 Personal history of transient ischemic attack (TIA), and cerebral infarction without residual deficits: Secondary | ICD-10-CM | POA: Diagnosis not present

## 2020-03-04 DIAGNOSIS — I351 Nonrheumatic aortic (valve) insufficiency: Secondary | ICD-10-CM | POA: Diagnosis not present

## 2020-03-04 DIAGNOSIS — I959 Hypotension, unspecified: Secondary | ICD-10-CM | POA: Diagnosis not present

## 2020-03-04 DIAGNOSIS — R110 Nausea: Secondary | ICD-10-CM | POA: Diagnosis not present

## 2020-03-04 DIAGNOSIS — M25552 Pain in left hip: Secondary | ICD-10-CM | POA: Diagnosis not present

## 2020-03-04 DIAGNOSIS — S0101XA Laceration without foreign body of scalp, initial encounter: Secondary | ICD-10-CM | POA: Diagnosis not present

## 2020-03-04 DIAGNOSIS — H353132 Nonexudative age-related macular degeneration, bilateral, intermediate dry stage: Secondary | ICD-10-CM | POA: Diagnosis not present

## 2020-03-04 DIAGNOSIS — M19042 Primary osteoarthritis, left hand: Secondary | ICD-10-CM | POA: Diagnosis not present

## 2020-03-04 DIAGNOSIS — G5601 Carpal tunnel syndrome, right upper limb: Secondary | ICD-10-CM | POA: Diagnosis not present

## 2020-03-04 DIAGNOSIS — D638 Anemia in other chronic diseases classified elsewhere: Secondary | ICD-10-CM | POA: Diagnosis not present

## 2020-03-04 DIAGNOSIS — M86172 Other acute osteomyelitis, left ankle and foot: Secondary | ICD-10-CM | POA: Diagnosis not present

## 2020-03-04 DIAGNOSIS — Z9981 Dependence on supplemental oxygen: Secondary | ICD-10-CM | POA: Diagnosis not present

## 2020-03-04 DIAGNOSIS — M160 Bilateral primary osteoarthritis of hip: Secondary | ICD-10-CM | POA: Diagnosis not present

## 2020-03-04 DIAGNOSIS — E43 Unspecified severe protein-calorie malnutrition: Secondary | ICD-10-CM | POA: Diagnosis not present

## 2020-03-04 DIAGNOSIS — J439 Emphysema, unspecified: Secondary | ICD-10-CM | POA: Diagnosis not present

## 2020-03-04 DIAGNOSIS — H9193 Unspecified hearing loss, bilateral: Secondary | ICD-10-CM | POA: Diagnosis not present

## 2020-03-04 DIAGNOSIS — D649 Anemia, unspecified: Secondary | ICD-10-CM | POA: Diagnosis not present

## 2020-03-04 DIAGNOSIS — A419 Sepsis, unspecified organism: Secondary | ICD-10-CM | POA: Diagnosis not present

## 2020-03-04 DIAGNOSIS — B370 Candidal stomatitis: Secondary | ICD-10-CM | POA: Diagnosis not present

## 2020-03-04 DIAGNOSIS — M7540 Impingement syndrome of unspecified shoulder: Secondary | ICD-10-CM | POA: Diagnosis not present

## 2020-03-04 DIAGNOSIS — R0989 Other specified symptoms and signs involving the circulatory and respiratory systems: Secondary | ICD-10-CM | POA: Diagnosis not present

## 2020-03-04 DIAGNOSIS — I639 Cerebral infarction, unspecified: Secondary | ICD-10-CM | POA: Diagnosis not present

## 2020-03-04 DIAGNOSIS — E349 Endocrine disorder, unspecified: Secondary | ICD-10-CM | POA: Diagnosis not present

## 2020-03-04 DIAGNOSIS — H6121 Impacted cerumen, right ear: Secondary | ICD-10-CM | POA: Diagnosis not present

## 2020-03-04 DIAGNOSIS — S0001XA Abrasion of scalp, initial encounter: Secondary | ICD-10-CM | POA: Diagnosis not present

## 2020-03-04 DIAGNOSIS — Z992 Dependence on renal dialysis: Secondary | ICD-10-CM | POA: Diagnosis not present

## 2020-03-04 DIAGNOSIS — Z7901 Long term (current) use of anticoagulants: Secondary | ICD-10-CM | POA: Diagnosis not present

## 2020-03-04 DIAGNOSIS — S300XXA Contusion of lower back and pelvis, initial encounter: Secondary | ICD-10-CM | POA: Diagnosis not present

## 2020-03-04 DIAGNOSIS — G9340 Encephalopathy, unspecified: Secondary | ICD-10-CM | POA: Diagnosis not present

## 2020-03-04 DIAGNOSIS — E1122 Type 2 diabetes mellitus with diabetic chronic kidney disease: Secondary | ICD-10-CM | POA: Diagnosis not present

## 2020-03-04 DIAGNOSIS — R0789 Other chest pain: Secondary | ICD-10-CM | POA: Diagnosis not present

## 2020-03-04 DIAGNOSIS — Z79890 Hormone replacement therapy (postmenopausal): Secondary | ICD-10-CM | POA: Diagnosis not present

## 2020-03-04 DIAGNOSIS — Z780 Asymptomatic menopausal state: Secondary | ICD-10-CM | POA: Diagnosis not present

## 2020-03-04 DIAGNOSIS — R112 Nausea with vomiting, unspecified: Secondary | ICD-10-CM | POA: Diagnosis not present

## 2020-03-04 DIAGNOSIS — J9611 Chronic respiratory failure with hypoxia: Secondary | ICD-10-CM | POA: Diagnosis not present

## 2020-03-04 DIAGNOSIS — I313 Pericardial effusion (noninflammatory): Secondary | ICD-10-CM | POA: Diagnosis not present

## 2020-03-04 DIAGNOSIS — Z9013 Acquired absence of bilateral breasts and nipples: Secondary | ICD-10-CM | POA: Diagnosis not present

## 2020-03-04 DIAGNOSIS — I69351 Hemiplegia and hemiparesis following cerebral infarction affecting right dominant side: Secondary | ICD-10-CM | POA: Diagnosis not present

## 2020-03-04 DIAGNOSIS — D509 Iron deficiency anemia, unspecified: Secondary | ICD-10-CM | POA: Diagnosis not present

## 2020-03-04 DIAGNOSIS — G8929 Other chronic pain: Secondary | ICD-10-CM | POA: Diagnosis not present

## 2020-03-04 DIAGNOSIS — D751 Secondary polycythemia: Secondary | ICD-10-CM | POA: Diagnosis not present

## 2020-03-04 DIAGNOSIS — I509 Heart failure, unspecified: Secondary | ICD-10-CM | POA: Diagnosis not present

## 2020-03-04 DIAGNOSIS — R9082 White matter disease, unspecified: Secondary | ICD-10-CM | POA: Diagnosis not present

## 2020-03-04 DIAGNOSIS — D631 Anemia in chronic kidney disease: Secondary | ICD-10-CM | POA: Diagnosis not present

## 2020-03-04 DIAGNOSIS — S62162A Displaced fracture of pisiform, left wrist, initial encounter for closed fracture: Secondary | ICD-10-CM | POA: Diagnosis not present

## 2020-03-04 DIAGNOSIS — Z03818 Encounter for observation for suspected exposure to other biological agents ruled out: Secondary | ICD-10-CM | POA: Diagnosis not present

## 2020-03-04 DIAGNOSIS — R2689 Other abnormalities of gait and mobility: Secondary | ICD-10-CM | POA: Diagnosis not present

## 2020-03-04 DIAGNOSIS — E039 Hypothyroidism, unspecified: Secondary | ICD-10-CM | POA: Diagnosis not present

## 2020-03-04 DIAGNOSIS — R062 Wheezing: Secondary | ICD-10-CM | POA: Diagnosis not present

## 2020-03-04 DIAGNOSIS — J45909 Unspecified asthma, uncomplicated: Secondary | ICD-10-CM | POA: Diagnosis not present

## 2020-03-04 DIAGNOSIS — Z9911 Dependence on respirator [ventilator] status: Secondary | ICD-10-CM | POA: Diagnosis not present

## 2020-03-04 DIAGNOSIS — Z8616 Personal history of COVID-19: Secondary | ICD-10-CM | POA: Diagnosis not present

## 2020-03-04 DIAGNOSIS — M7020 Olecranon bursitis, unspecified elbow: Secondary | ICD-10-CM | POA: Diagnosis not present

## 2020-03-04 DIAGNOSIS — I440 Atrioventricular block, first degree: Secondary | ICD-10-CM | POA: Diagnosis not present

## 2020-03-04 DIAGNOSIS — I130 Hypertensive heart and chronic kidney disease with heart failure and stage 1 through stage 4 chronic kidney disease, or unspecified chronic kidney disease: Secondary | ICD-10-CM | POA: Diagnosis not present

## 2020-03-04 DIAGNOSIS — E875 Hyperkalemia: Secondary | ICD-10-CM | POA: Diagnosis not present

## 2020-03-04 DIAGNOSIS — K660 Peritoneal adhesions (postprocedural) (postinfection): Secondary | ICD-10-CM | POA: Diagnosis not present

## 2020-03-04 DIAGNOSIS — D72829 Elevated white blood cell count, unspecified: Secondary | ICD-10-CM | POA: Diagnosis not present

## 2020-03-04 DIAGNOSIS — I452 Bifascicular block: Secondary | ICD-10-CM | POA: Diagnosis not present

## 2020-03-04 DIAGNOSIS — M80052D Age-related osteoporosis with current pathological fracture, left femur, subsequent encounter for fracture with routine healing: Secondary | ICD-10-CM | POA: Diagnosis not present

## 2020-03-04 DIAGNOSIS — J208 Acute bronchitis due to other specified organisms: Secondary | ICD-10-CM | POA: Diagnosis not present

## 2020-03-04 DIAGNOSIS — I447 Left bundle-branch block, unspecified: Secondary | ICD-10-CM | POA: Diagnosis not present

## 2020-03-04 DIAGNOSIS — Z882 Allergy status to sulfonamides status: Secondary | ICD-10-CM | POA: Diagnosis not present

## 2020-03-04 DIAGNOSIS — G43009 Migraine without aura, not intractable, without status migrainosus: Secondary | ICD-10-CM | POA: Diagnosis not present

## 2020-03-04 DIAGNOSIS — M792 Neuralgia and neuritis, unspecified: Secondary | ICD-10-CM | POA: Diagnosis not present

## 2020-03-04 DIAGNOSIS — F1010 Alcohol abuse, uncomplicated: Secondary | ICD-10-CM | POA: Diagnosis not present

## 2020-03-04 DIAGNOSIS — G35 Multiple sclerosis: Secondary | ICD-10-CM | POA: Diagnosis not present

## 2020-03-04 DIAGNOSIS — G629 Polyneuropathy, unspecified: Secondary | ICD-10-CM | POA: Diagnosis not present

## 2020-03-04 DIAGNOSIS — R339 Retention of urine, unspecified: Secondary | ICD-10-CM | POA: Diagnosis not present

## 2020-03-04 DIAGNOSIS — R81 Glycosuria: Secondary | ICD-10-CM | POA: Diagnosis not present

## 2020-03-04 DIAGNOSIS — Z433 Encounter for attention to colostomy: Secondary | ICD-10-CM | POA: Diagnosis not present

## 2020-03-04 DIAGNOSIS — M25551 Pain in right hip: Secondary | ICD-10-CM | POA: Diagnosis not present

## 2020-03-04 DIAGNOSIS — M797 Fibromyalgia: Secondary | ICD-10-CM | POA: Diagnosis not present

## 2020-03-04 DIAGNOSIS — E7849 Other hyperlipidemia: Secondary | ICD-10-CM | POA: Diagnosis not present

## 2020-03-04 DIAGNOSIS — L2489 Irritant contact dermatitis due to other agents: Secondary | ICD-10-CM | POA: Diagnosis not present

## 2020-03-04 DIAGNOSIS — S0003XA Contusion of scalp, initial encounter: Secondary | ICD-10-CM | POA: Diagnosis not present

## 2020-03-04 DIAGNOSIS — I471 Supraventricular tachycardia: Secondary | ICD-10-CM | POA: Diagnosis not present

## 2020-03-04 DIAGNOSIS — Z7982 Long term (current) use of aspirin: Secondary | ICD-10-CM | POA: Diagnosis not present

## 2020-03-04 DIAGNOSIS — J440 Chronic obstructive pulmonary disease with acute lower respiratory infection: Secondary | ICD-10-CM | POA: Diagnosis not present

## 2020-03-04 DIAGNOSIS — M7989 Other specified soft tissue disorders: Secondary | ICD-10-CM | POA: Diagnosis not present

## 2020-03-04 DIAGNOSIS — I714 Abdominal aortic aneurysm, without rupture: Secondary | ICD-10-CM | POA: Diagnosis not present

## 2020-03-04 DIAGNOSIS — N410 Acute prostatitis: Secondary | ICD-10-CM | POA: Diagnosis not present

## 2020-03-04 DIAGNOSIS — K8012 Calculus of gallbladder with acute and chronic cholecystitis without obstruction: Secondary | ICD-10-CM | POA: Diagnosis not present

## 2020-03-04 DIAGNOSIS — R5381 Other malaise: Secondary | ICD-10-CM | POA: Diagnosis not present

## 2020-03-04 DIAGNOSIS — S0990XA Unspecified injury of head, initial encounter: Secondary | ICD-10-CM | POA: Diagnosis not present

## 2020-03-04 DIAGNOSIS — K225 Diverticulum of esophagus, acquired: Secondary | ICD-10-CM | POA: Diagnosis not present

## 2020-03-04 DIAGNOSIS — K219 Gastro-esophageal reflux disease without esophagitis: Secondary | ICD-10-CM | POA: Diagnosis not present

## 2020-03-04 DIAGNOSIS — Z6841 Body Mass Index (BMI) 40.0 and over, adult: Secondary | ICD-10-CM | POA: Diagnosis not present

## 2020-03-04 DIAGNOSIS — I482 Chronic atrial fibrillation, unspecified: Secondary | ICD-10-CM | POA: Diagnosis not present

## 2020-03-04 DIAGNOSIS — K838 Other specified diseases of biliary tract: Secondary | ICD-10-CM | POA: Diagnosis not present

## 2020-03-04 DIAGNOSIS — F329 Major depressive disorder, single episode, unspecified: Secondary | ICD-10-CM | POA: Diagnosis not present

## 2020-03-04 DIAGNOSIS — S3993XA Unspecified injury of pelvis, initial encounter: Secondary | ICD-10-CM | POA: Diagnosis not present

## 2020-03-04 DIAGNOSIS — J029 Acute pharyngitis, unspecified: Secondary | ICD-10-CM | POA: Diagnosis not present

## 2020-03-04 DIAGNOSIS — J9621 Acute and chronic respiratory failure with hypoxia: Secondary | ICD-10-CM | POA: Diagnosis not present

## 2020-03-04 DIAGNOSIS — J8410 Pulmonary fibrosis, unspecified: Secondary | ICD-10-CM | POA: Diagnosis not present

## 2020-03-04 DIAGNOSIS — I4819 Other persistent atrial fibrillation: Secondary | ICD-10-CM | POA: Diagnosis not present

## 2020-03-04 DIAGNOSIS — E876 Hypokalemia: Secondary | ICD-10-CM | POA: Diagnosis not present

## 2020-03-04 DIAGNOSIS — I5042 Chronic combined systolic (congestive) and diastolic (congestive) heart failure: Secondary | ICD-10-CM | POA: Diagnosis not present

## 2020-03-04 DIAGNOSIS — R4182 Altered mental status, unspecified: Secondary | ICD-10-CM | POA: Diagnosis not present

## 2020-03-04 DIAGNOSIS — G9341 Metabolic encephalopathy: Secondary | ICD-10-CM | POA: Diagnosis not present

## 2020-03-04 DIAGNOSIS — K449 Diaphragmatic hernia without obstruction or gangrene: Secondary | ICD-10-CM | POA: Diagnosis not present

## 2020-03-04 DIAGNOSIS — S2239XA Fracture of one rib, unspecified side, initial encounter for closed fracture: Secondary | ICD-10-CM | POA: Diagnosis not present

## 2020-03-04 DIAGNOSIS — M1990 Unspecified osteoarthritis, unspecified site: Secondary | ICD-10-CM | POA: Diagnosis not present

## 2020-03-04 DIAGNOSIS — E1151 Type 2 diabetes mellitus with diabetic peripheral angiopathy without gangrene: Secondary | ICD-10-CM | POA: Diagnosis not present

## 2020-03-04 DIAGNOSIS — R519 Headache, unspecified: Secondary | ICD-10-CM | POA: Diagnosis not present

## 2020-03-04 DIAGNOSIS — S199XXA Unspecified injury of neck, initial encounter: Secondary | ICD-10-CM | POA: Diagnosis not present

## 2020-03-04 DIAGNOSIS — F1027 Alcohol dependence with alcohol-induced persisting dementia: Secondary | ICD-10-CM | POA: Diagnosis not present

## 2020-03-04 DIAGNOSIS — S40011A Contusion of right shoulder, initial encounter: Secondary | ICD-10-CM | POA: Diagnosis not present

## 2020-03-04 DIAGNOSIS — R630 Anorexia: Secondary | ICD-10-CM | POA: Diagnosis not present

## 2020-03-04 DIAGNOSIS — F5104 Psychophysiologic insomnia: Secondary | ICD-10-CM | POA: Diagnosis not present

## 2020-03-04 DIAGNOSIS — N2889 Other specified disorders of kidney and ureter: Secondary | ICD-10-CM | POA: Diagnosis not present

## 2020-03-04 DIAGNOSIS — H4089 Other specified glaucoma: Secondary | ICD-10-CM | POA: Diagnosis not present

## 2020-03-04 DIAGNOSIS — R7303 Prediabetes: Secondary | ICD-10-CM | POA: Diagnosis not present

## 2020-03-04 DIAGNOSIS — I248 Other forms of acute ischemic heart disease: Secondary | ICD-10-CM | POA: Diagnosis not present

## 2020-03-04 DIAGNOSIS — N186 End stage renal disease: Secondary | ICD-10-CM | POA: Diagnosis not present

## 2020-03-04 DIAGNOSIS — I517 Cardiomegaly: Secondary | ICD-10-CM | POA: Diagnosis not present

## 2020-03-04 DIAGNOSIS — J9601 Acute respiratory failure with hypoxia: Secondary | ICD-10-CM | POA: Diagnosis not present

## 2020-03-04 DIAGNOSIS — M5030 Other cervical disc degeneration, unspecified cervical region: Secondary | ICD-10-CM | POA: Diagnosis not present

## 2020-03-04 DIAGNOSIS — Z6839 Body mass index (BMI) 39.0-39.9, adult: Secondary | ICD-10-CM | POA: Diagnosis not present

## 2020-03-04 DIAGNOSIS — N179 Acute kidney failure, unspecified: Secondary | ICD-10-CM | POA: Diagnosis not present

## 2020-03-04 DIAGNOSIS — I350 Nonrheumatic aortic (valve) stenosis: Secondary | ICD-10-CM | POA: Diagnosis not present

## 2020-03-04 DIAGNOSIS — F909 Attention-deficit hyperactivity disorder, unspecified type: Secondary | ICD-10-CM | POA: Diagnosis not present

## 2020-03-04 DIAGNOSIS — J438 Other emphysema: Secondary | ICD-10-CM | POA: Diagnosis not present

## 2020-03-04 DIAGNOSIS — F411 Generalized anxiety disorder: Secondary | ICD-10-CM | POA: Diagnosis not present

## 2020-03-04 DIAGNOSIS — K5909 Other constipation: Secondary | ICD-10-CM | POA: Diagnosis not present

## 2020-03-04 DIAGNOSIS — R222 Localized swelling, mass and lump, trunk: Secondary | ICD-10-CM | POA: Diagnosis not present

## 2020-03-04 DIAGNOSIS — N1330 Unspecified hydronephrosis: Secondary | ICD-10-CM | POA: Diagnosis not present

## 2020-03-04 DIAGNOSIS — R413 Other amnesia: Secondary | ICD-10-CM | POA: Diagnosis not present

## 2020-03-04 DIAGNOSIS — Z79899 Other long term (current) drug therapy: Secondary | ICD-10-CM | POA: Diagnosis not present

## 2020-03-04 DIAGNOSIS — Z9181 History of falling: Secondary | ICD-10-CM | POA: Diagnosis not present

## 2020-03-04 DIAGNOSIS — I340 Nonrheumatic mitral (valve) insufficiency: Secondary | ICD-10-CM | POA: Diagnosis not present

## 2020-03-04 DIAGNOSIS — E860 Dehydration: Secondary | ICD-10-CM | POA: Diagnosis not present

## 2020-03-04 DIAGNOSIS — K900 Celiac disease: Secondary | ICD-10-CM | POA: Diagnosis not present

## 2020-03-04 DIAGNOSIS — I2510 Atherosclerotic heart disease of native coronary artery without angina pectoris: Secondary | ICD-10-CM | POA: Diagnosis not present

## 2020-03-04 DIAGNOSIS — S79912A Unspecified injury of left hip, initial encounter: Secondary | ICD-10-CM | POA: Diagnosis not present

## 2020-03-04 DIAGNOSIS — S72002A Fracture of unspecified part of neck of left femur, initial encounter for closed fracture: Secondary | ICD-10-CM | POA: Diagnosis not present

## 2020-03-04 DIAGNOSIS — G44221 Chronic tension-type headache, intractable: Secondary | ICD-10-CM | POA: Diagnosis not present

## 2020-03-04 DIAGNOSIS — S52022A Displaced fracture of olecranon process without intraarticular extension of left ulna, initial encounter for closed fracture: Secondary | ICD-10-CM | POA: Diagnosis not present

## 2020-03-04 DIAGNOSIS — L02229 Furuncle of trunk, unspecified: Secondary | ICD-10-CM | POA: Diagnosis not present

## 2020-03-04 DIAGNOSIS — M25532 Pain in left wrist: Secondary | ICD-10-CM | POA: Diagnosis not present

## 2020-03-04 DIAGNOSIS — J45901 Unspecified asthma with (acute) exacerbation: Secondary | ICD-10-CM | POA: Diagnosis not present

## 2020-03-04 DIAGNOSIS — G40909 Epilepsy, unspecified, not intractable, without status epilepticus: Secondary | ICD-10-CM | POA: Diagnosis not present

## 2020-03-04 DIAGNOSIS — I5022 Chronic systolic (congestive) heart failure: Secondary | ICD-10-CM | POA: Diagnosis not present

## 2020-03-04 DIAGNOSIS — R609 Edema, unspecified: Secondary | ICD-10-CM | POA: Diagnosis not present

## 2020-03-04 DIAGNOSIS — N529 Male erectile dysfunction, unspecified: Secondary | ICD-10-CM | POA: Diagnosis not present

## 2020-03-04 DIAGNOSIS — R0602 Shortness of breath: Secondary | ICD-10-CM | POA: Diagnosis not present

## 2020-03-04 DIAGNOSIS — M48061 Spinal stenosis, lumbar region without neurogenic claudication: Secondary | ICD-10-CM | POA: Diagnosis not present

## 2020-03-04 DIAGNOSIS — Z96642 Presence of left artificial hip joint: Secondary | ICD-10-CM | POA: Diagnosis not present

## 2020-03-04 DIAGNOSIS — E7404 McArdle disease: Secondary | ICD-10-CM | POA: Diagnosis not present

## 2020-03-05 DIAGNOSIS — J019 Acute sinusitis, unspecified: Secondary | ICD-10-CM | POA: Diagnosis not present

## 2020-03-05 DIAGNOSIS — J90 Pleural effusion, not elsewhere classified: Secondary | ICD-10-CM | POA: Diagnosis not present

## 2020-03-05 DIAGNOSIS — U071 COVID-19: Secondary | ICD-10-CM | POA: Diagnosis not present

## 2020-03-05 DIAGNOSIS — I951 Orthostatic hypotension: Secondary | ICD-10-CM | POA: Diagnosis not present

## 2020-03-05 DIAGNOSIS — Z23 Encounter for immunization: Secondary | ICD-10-CM | POA: Diagnosis not present

## 2020-03-05 DIAGNOSIS — T45515A Adverse effect of anticoagulants, initial encounter: Secondary | ICD-10-CM | POA: Diagnosis not present

## 2020-03-05 DIAGNOSIS — I10 Essential (primary) hypertension: Secondary | ICD-10-CM | POA: Diagnosis not present

## 2020-03-05 DIAGNOSIS — E785 Hyperlipidemia, unspecified: Secondary | ICD-10-CM | POA: Diagnosis not present

## 2020-03-05 DIAGNOSIS — I152 Hypertension secondary to endocrine disorders: Secondary | ICD-10-CM | POA: Diagnosis not present

## 2020-03-05 DIAGNOSIS — J45909 Unspecified asthma, uncomplicated: Secondary | ICD-10-CM | POA: Diagnosis not present

## 2020-03-05 DIAGNOSIS — R52 Pain, unspecified: Secondary | ICD-10-CM | POA: Diagnosis not present

## 2020-03-05 DIAGNOSIS — R1110 Vomiting, unspecified: Secondary | ICD-10-CM | POA: Diagnosis not present

## 2020-03-05 DIAGNOSIS — R109 Unspecified abdominal pain: Secondary | ICD-10-CM | POA: Diagnosis not present

## 2020-03-05 DIAGNOSIS — E039 Hypothyroidism, unspecified: Secondary | ICD-10-CM | POA: Diagnosis not present

## 2020-03-05 DIAGNOSIS — K591 Functional diarrhea: Secondary | ICD-10-CM | POA: Diagnosis not present

## 2020-03-05 DIAGNOSIS — I6502 Occlusion and stenosis of left vertebral artery: Secondary | ICD-10-CM | POA: Diagnosis not present

## 2020-03-05 DIAGNOSIS — I7101 Dissection of thoracic aorta: Secondary | ICD-10-CM | POA: Diagnosis not present

## 2020-03-05 DIAGNOSIS — J9811 Atelectasis: Secondary | ICD-10-CM | POA: Diagnosis not present

## 2020-03-05 DIAGNOSIS — M5136 Other intervertebral disc degeneration, lumbar region: Secondary | ICD-10-CM | POA: Diagnosis not present

## 2020-03-05 DIAGNOSIS — E1149 Type 2 diabetes mellitus with other diabetic neurological complication: Secondary | ICD-10-CM | POA: Diagnosis not present

## 2020-03-05 DIAGNOSIS — R29898 Other symptoms and signs involving the musculoskeletal system: Secondary | ICD-10-CM | POA: Diagnosis not present

## 2020-03-05 DIAGNOSIS — H1013 Acute atopic conjunctivitis, bilateral: Secondary | ICD-10-CM | POA: Diagnosis not present

## 2020-03-05 DIAGNOSIS — Z951 Presence of aortocoronary bypass graft: Secondary | ICD-10-CM | POA: Diagnosis not present

## 2020-03-05 DIAGNOSIS — Z03818 Encounter for observation for suspected exposure to other biological agents ruled out: Secondary | ICD-10-CM | POA: Diagnosis not present

## 2020-03-05 DIAGNOSIS — Z8249 Family history of ischemic heart disease and other diseases of the circulatory system: Secondary | ICD-10-CM | POA: Diagnosis not present

## 2020-03-05 DIAGNOSIS — R0600 Dyspnea, unspecified: Secondary | ICD-10-CM | POA: Diagnosis not present

## 2020-03-05 DIAGNOSIS — Z952 Presence of prosthetic heart valve: Secondary | ICD-10-CM | POA: Diagnosis not present

## 2020-03-05 DIAGNOSIS — R1084 Generalized abdominal pain: Secondary | ICD-10-CM | POA: Diagnosis not present

## 2020-03-05 DIAGNOSIS — S79912A Unspecified injury of left hip, initial encounter: Secondary | ICD-10-CM | POA: Diagnosis not present

## 2020-03-05 DIAGNOSIS — S199XXA Unspecified injury of neck, initial encounter: Secondary | ICD-10-CM | POA: Diagnosis not present

## 2020-03-05 DIAGNOSIS — E782 Mixed hyperlipidemia: Secondary | ICD-10-CM | POA: Diagnosis not present

## 2020-03-05 DIAGNOSIS — S86012A Strain of left Achilles tendon, initial encounter: Secondary | ICD-10-CM | POA: Diagnosis not present

## 2020-03-05 DIAGNOSIS — Z7984 Long term (current) use of oral hypoglycemic drugs: Secondary | ICD-10-CM | POA: Diagnosis not present

## 2020-03-05 DIAGNOSIS — J9620 Acute and chronic respiratory failure, unspecified whether with hypoxia or hypercapnia: Secondary | ICD-10-CM | POA: Diagnosis not present

## 2020-03-05 DIAGNOSIS — S0990XA Unspecified injury of head, initial encounter: Secondary | ICD-10-CM | POA: Diagnosis not present

## 2020-03-05 DIAGNOSIS — I341 Nonrheumatic mitral (valve) prolapse: Secondary | ICD-10-CM | POA: Diagnosis not present

## 2020-03-05 DIAGNOSIS — R6520 Severe sepsis without septic shock: Secondary | ICD-10-CM | POA: Diagnosis not present

## 2020-03-05 DIAGNOSIS — R791 Abnormal coagulation profile: Secondary | ICD-10-CM | POA: Diagnosis not present

## 2020-03-05 DIAGNOSIS — H353132 Nonexudative age-related macular degeneration, bilateral, intermediate dry stage: Secondary | ICD-10-CM | POA: Diagnosis not present

## 2020-03-05 DIAGNOSIS — R0602 Shortness of breath: Secondary | ICD-10-CM | POA: Diagnosis not present

## 2020-03-05 DIAGNOSIS — J449 Chronic obstructive pulmonary disease, unspecified: Secondary | ICD-10-CM | POA: Diagnosis not present

## 2020-03-05 DIAGNOSIS — Z885 Allergy status to narcotic agent status: Secondary | ICD-10-CM | POA: Diagnosis not present

## 2020-03-05 DIAGNOSIS — I878 Other specified disorders of veins: Secondary | ICD-10-CM | POA: Diagnosis not present

## 2020-03-05 DIAGNOSIS — N179 Acute kidney failure, unspecified: Secondary | ICD-10-CM | POA: Diagnosis not present

## 2020-03-05 DIAGNOSIS — G2581 Restless legs syndrome: Secondary | ICD-10-CM | POA: Diagnosis not present

## 2020-03-05 DIAGNOSIS — M4326 Fusion of spine, lumbar region: Secondary | ICD-10-CM | POA: Diagnosis not present

## 2020-03-05 DIAGNOSIS — I5022 Chronic systolic (congestive) heart failure: Secondary | ICD-10-CM | POA: Diagnosis not present

## 2020-03-05 DIAGNOSIS — I361 Nonrheumatic tricuspid (valve) insufficiency: Secondary | ICD-10-CM | POA: Diagnosis not present

## 2020-03-05 DIAGNOSIS — S299XXA Unspecified injury of thorax, initial encounter: Secondary | ICD-10-CM | POA: Diagnosis not present

## 2020-03-05 DIAGNOSIS — I5032 Chronic diastolic (congestive) heart failure: Secondary | ICD-10-CM | POA: Diagnosis not present

## 2020-03-05 DIAGNOSIS — I2510 Atherosclerotic heart disease of native coronary artery without angina pectoris: Secondary | ICD-10-CM | POA: Diagnosis not present

## 2020-03-05 DIAGNOSIS — R296 Repeated falls: Secondary | ICD-10-CM | POA: Diagnosis not present

## 2020-03-05 DIAGNOSIS — J209 Acute bronchitis, unspecified: Secondary | ICD-10-CM | POA: Diagnosis not present

## 2020-03-05 DIAGNOSIS — N400 Enlarged prostate without lower urinary tract symptoms: Secondary | ICD-10-CM | POA: Diagnosis not present

## 2020-03-05 DIAGNOSIS — Z7409 Other reduced mobility: Secondary | ICD-10-CM | POA: Diagnosis not present

## 2020-03-05 DIAGNOSIS — Z9049 Acquired absence of other specified parts of digestive tract: Secondary | ICD-10-CM | POA: Diagnosis not present

## 2020-03-05 DIAGNOSIS — R7303 Prediabetes: Secondary | ICD-10-CM | POA: Diagnosis not present

## 2020-03-05 DIAGNOSIS — Z4800 Encounter for change or removal of nonsurgical wound dressing: Secondary | ICD-10-CM | POA: Diagnosis not present

## 2020-03-05 DIAGNOSIS — R4702 Dysphasia: Secondary | ICD-10-CM | POA: Diagnosis not present

## 2020-03-05 DIAGNOSIS — S61209A Unspecified open wound of unspecified finger without damage to nail, initial encounter: Secondary | ICD-10-CM | POA: Diagnosis not present

## 2020-03-05 DIAGNOSIS — J438 Other emphysema: Secondary | ICD-10-CM | POA: Diagnosis not present

## 2020-03-05 DIAGNOSIS — R002 Palpitations: Secondary | ICD-10-CM | POA: Diagnosis not present

## 2020-03-05 DIAGNOSIS — I4819 Other persistent atrial fibrillation: Secondary | ICD-10-CM | POA: Diagnosis not present

## 2020-03-05 DIAGNOSIS — Z9889 Other specified postprocedural states: Secondary | ICD-10-CM | POA: Diagnosis not present

## 2020-03-05 DIAGNOSIS — Z01812 Encounter for preprocedural laboratory examination: Secondary | ICD-10-CM | POA: Diagnosis not present

## 2020-03-05 DIAGNOSIS — H353122 Nonexudative age-related macular degeneration, left eye, intermediate dry stage: Secondary | ICD-10-CM | POA: Diagnosis not present

## 2020-03-05 DIAGNOSIS — C250 Malignant neoplasm of head of pancreas: Secondary | ICD-10-CM | POA: Diagnosis not present

## 2020-03-05 DIAGNOSIS — D61818 Other pancytopenia: Secondary | ICD-10-CM | POA: Diagnosis not present

## 2020-03-05 DIAGNOSIS — Z7901 Long term (current) use of anticoagulants: Secondary | ICD-10-CM | POA: Diagnosis not present

## 2020-03-05 DIAGNOSIS — R197 Diarrhea, unspecified: Secondary | ICD-10-CM | POA: Diagnosis not present

## 2020-03-05 DIAGNOSIS — R55 Syncope and collapse: Secondary | ICD-10-CM | POA: Diagnosis not present

## 2020-03-05 DIAGNOSIS — B961 Klebsiella pneumoniae [K. pneumoniae] as the cause of diseases classified elsewhere: Secondary | ICD-10-CM | POA: Diagnosis not present

## 2020-03-05 DIAGNOSIS — E8809 Other disorders of plasma-protein metabolism, not elsewhere classified: Secondary | ICD-10-CM | POA: Diagnosis not present

## 2020-03-05 DIAGNOSIS — E871 Hypo-osmolality and hyponatremia: Secondary | ICD-10-CM | POA: Diagnosis not present

## 2020-03-05 DIAGNOSIS — Z20822 Contact with and (suspected) exposure to covid-19: Secondary | ICD-10-CM | POA: Diagnosis not present

## 2020-03-05 DIAGNOSIS — H579 Unspecified disorder of eye and adnexa: Secondary | ICD-10-CM | POA: Diagnosis not present

## 2020-03-05 DIAGNOSIS — H5711 Ocular pain, right eye: Secondary | ICD-10-CM | POA: Diagnosis not present

## 2020-03-05 DIAGNOSIS — M7989 Other specified soft tissue disorders: Secondary | ICD-10-CM | POA: Diagnosis not present

## 2020-03-05 DIAGNOSIS — Z9911 Dependence on respirator [ventilator] status: Secondary | ICD-10-CM | POA: Diagnosis not present

## 2020-03-05 DIAGNOSIS — J441 Chronic obstructive pulmonary disease with (acute) exacerbation: Secondary | ICD-10-CM | POA: Diagnosis not present

## 2020-03-05 DIAGNOSIS — S0003XA Contusion of scalp, initial encounter: Secondary | ICD-10-CM | POA: Diagnosis not present

## 2020-03-05 DIAGNOSIS — Z96643 Presence of artificial hip joint, bilateral: Secondary | ICD-10-CM | POA: Diagnosis not present

## 2020-03-05 DIAGNOSIS — D631 Anemia in chronic kidney disease: Secondary | ICD-10-CM | POA: Diagnosis not present

## 2020-03-05 DIAGNOSIS — J810 Acute pulmonary edema: Secondary | ICD-10-CM | POA: Diagnosis not present

## 2020-03-05 DIAGNOSIS — Z4801 Encounter for change or removal of surgical wound dressing: Secondary | ICD-10-CM | POA: Diagnosis not present

## 2020-03-05 DIAGNOSIS — J9601 Acute respiratory failure with hypoxia: Secondary | ICD-10-CM | POA: Diagnosis not present

## 2020-03-05 DIAGNOSIS — A419 Sepsis, unspecified organism: Secondary | ICD-10-CM | POA: Diagnosis not present

## 2020-03-05 DIAGNOSIS — G9341 Metabolic encephalopathy: Secondary | ICD-10-CM | POA: Diagnosis not present

## 2020-03-05 DIAGNOSIS — Z4682 Encounter for fitting and adjustment of non-vascular catheter: Secondary | ICD-10-CM | POA: Diagnosis not present

## 2020-03-05 DIAGNOSIS — F410 Panic disorder [episodic paroxysmal anxiety] without agoraphobia: Secondary | ICD-10-CM | POA: Diagnosis not present

## 2020-03-05 DIAGNOSIS — Z7189 Other specified counseling: Secondary | ICD-10-CM | POA: Diagnosis not present

## 2020-03-05 DIAGNOSIS — M545 Low back pain: Secondary | ICD-10-CM | POA: Diagnosis not present

## 2020-03-05 DIAGNOSIS — W268XXA Contact with other sharp object(s), not elsewhere classified, initial encounter: Secondary | ICD-10-CM | POA: Diagnosis not present

## 2020-03-05 DIAGNOSIS — Z833 Family history of diabetes mellitus: Secondary | ICD-10-CM | POA: Diagnosis not present

## 2020-03-05 DIAGNOSIS — Z9181 History of falling: Secondary | ICD-10-CM | POA: Diagnosis not present

## 2020-03-05 DIAGNOSIS — I130 Hypertensive heart and chronic kidney disease with heart failure and stage 1 through stage 4 chronic kidney disease, or unspecified chronic kidney disease: Secondary | ICD-10-CM | POA: Diagnosis not present

## 2020-03-05 DIAGNOSIS — R531 Weakness: Secondary | ICD-10-CM | POA: Diagnosis not present

## 2020-03-05 DIAGNOSIS — F411 Generalized anxiety disorder: Secondary | ICD-10-CM | POA: Diagnosis not present

## 2020-03-05 DIAGNOSIS — R0902 Hypoxemia: Secondary | ICD-10-CM | POA: Diagnosis not present

## 2020-03-05 DIAGNOSIS — I63212 Cerebral infarction due to unspecified occlusion or stenosis of left vertebral arteries: Secondary | ICD-10-CM | POA: Diagnosis not present

## 2020-03-05 DIAGNOSIS — S0181XA Laceration without foreign body of other part of head, initial encounter: Secondary | ICD-10-CM | POA: Diagnosis not present

## 2020-03-05 DIAGNOSIS — R4182 Altered mental status, unspecified: Secondary | ICD-10-CM | POA: Diagnosis not present

## 2020-03-05 DIAGNOSIS — N1832 Chronic kidney disease, stage 3b: Secondary | ICD-10-CM | POA: Diagnosis not present

## 2020-03-05 DIAGNOSIS — J9600 Acute respiratory failure, unspecified whether with hypoxia or hypercapnia: Secondary | ICD-10-CM | POA: Diagnosis not present

## 2020-03-05 DIAGNOSIS — E1122 Type 2 diabetes mellitus with diabetic chronic kidney disease: Secondary | ICD-10-CM | POA: Diagnosis not present

## 2020-03-05 DIAGNOSIS — Z85038 Personal history of other malignant neoplasm of large intestine: Secondary | ICD-10-CM | POA: Diagnosis not present

## 2020-03-05 DIAGNOSIS — G8190 Hemiplegia, unspecified affecting unspecified side: Secondary | ICD-10-CM | POA: Diagnosis not present

## 2020-03-05 DIAGNOSIS — S42402A Unspecified fracture of lower end of left humerus, initial encounter for closed fracture: Secondary | ICD-10-CM | POA: Diagnosis not present

## 2020-03-05 DIAGNOSIS — K219 Gastro-esophageal reflux disease without esophagitis: Secondary | ICD-10-CM | POA: Diagnosis not present

## 2020-03-05 DIAGNOSIS — E119 Type 2 diabetes mellitus without complications: Secondary | ICD-10-CM | POA: Diagnosis not present

## 2020-03-05 DIAGNOSIS — I252 Old myocardial infarction: Secondary | ICD-10-CM | POA: Diagnosis not present

## 2020-03-05 DIAGNOSIS — W010XXA Fall on same level from slipping, tripping and stumbling without subsequent striking against object, initial encounter: Secondary | ICD-10-CM | POA: Diagnosis not present

## 2020-03-05 DIAGNOSIS — K529 Noninfective gastroenteritis and colitis, unspecified: Secondary | ICD-10-CM | POA: Diagnosis not present

## 2020-03-05 DIAGNOSIS — I110 Hypertensive heart disease with heart failure: Secondary | ICD-10-CM | POA: Diagnosis not present

## 2020-03-05 DIAGNOSIS — C3490 Malignant neoplasm of unspecified part of unspecified bronchus or lung: Secondary | ICD-10-CM | POA: Diagnosis not present

## 2020-03-05 DIAGNOSIS — G35 Multiple sclerosis: Secondary | ICD-10-CM | POA: Diagnosis not present

## 2020-03-05 DIAGNOSIS — R600 Localized edema: Secondary | ICD-10-CM | POA: Diagnosis not present

## 2020-03-05 DIAGNOSIS — B9689 Other specified bacterial agents as the cause of diseases classified elsewhere: Secondary | ICD-10-CM | POA: Diagnosis not present

## 2020-03-05 DIAGNOSIS — R222 Localized swelling, mass and lump, trunk: Secondary | ICD-10-CM | POA: Diagnosis not present

## 2020-03-05 DIAGNOSIS — R4781 Slurred speech: Secondary | ICD-10-CM | POA: Diagnosis not present

## 2020-03-05 DIAGNOSIS — E876 Hypokalemia: Secondary | ICD-10-CM | POA: Diagnosis not present

## 2020-03-05 DIAGNOSIS — Z91048 Other nonmedicinal substance allergy status: Secondary | ICD-10-CM | POA: Diagnosis not present

## 2020-03-05 DIAGNOSIS — R069 Unspecified abnormalities of breathing: Secondary | ICD-10-CM | POA: Diagnosis not present

## 2020-03-05 DIAGNOSIS — S61201A Unspecified open wound of left index finger without damage to nail, initial encounter: Secondary | ICD-10-CM | POA: Diagnosis not present

## 2020-03-05 DIAGNOSIS — M79661 Pain in right lower leg: Secondary | ICD-10-CM | POA: Diagnosis not present

## 2020-03-05 DIAGNOSIS — I4891 Unspecified atrial fibrillation: Secondary | ICD-10-CM | POA: Diagnosis not present

## 2020-03-05 DIAGNOSIS — Z96611 Presence of right artificial shoulder joint: Secondary | ICD-10-CM | POA: Diagnosis not present

## 2020-03-05 DIAGNOSIS — R61 Generalized hyperhidrosis: Secondary | ICD-10-CM | POA: Diagnosis not present

## 2020-03-05 DIAGNOSIS — R778 Other specified abnormalities of plasma proteins: Secondary | ICD-10-CM | POA: Diagnosis not present

## 2020-03-05 DIAGNOSIS — M25572 Pain in left ankle and joints of left foot: Secondary | ICD-10-CM | POA: Diagnosis not present

## 2020-03-05 DIAGNOSIS — L02419 Cutaneous abscess of limb, unspecified: Secondary | ICD-10-CM | POA: Diagnosis not present

## 2020-03-05 DIAGNOSIS — S52022A Displaced fracture of olecranon process without intraarticular extension of left ulna, initial encounter for closed fracture: Secondary | ICD-10-CM | POA: Diagnosis not present

## 2020-03-05 DIAGNOSIS — M25532 Pain in left wrist: Secondary | ICD-10-CM | POA: Diagnosis not present

## 2020-03-05 DIAGNOSIS — D638 Anemia in other chronic diseases classified elsewhere: Secondary | ICD-10-CM | POA: Diagnosis not present

## 2020-03-05 DIAGNOSIS — R110 Nausea: Secondary | ICD-10-CM | POA: Diagnosis not present

## 2020-03-05 DIAGNOSIS — E2740 Unspecified adrenocortical insufficiency: Secondary | ICD-10-CM | POA: Diagnosis not present

## 2020-03-05 DIAGNOSIS — Z792 Long term (current) use of antibiotics: Secondary | ICD-10-CM | POA: Diagnosis not present

## 2020-03-05 DIAGNOSIS — G4489 Other headache syndrome: Secondary | ICD-10-CM | POA: Diagnosis not present

## 2020-03-05 DIAGNOSIS — Z954 Presence of other heart-valve replacement: Secondary | ICD-10-CM | POA: Diagnosis not present

## 2020-03-05 DIAGNOSIS — J208 Acute bronchitis due to other specified organisms: Secondary | ICD-10-CM | POA: Diagnosis not present

## 2020-03-05 DIAGNOSIS — E43 Unspecified severe protein-calorie malnutrition: Secondary | ICD-10-CM | POA: Diagnosis not present

## 2020-03-05 DIAGNOSIS — Z96612 Presence of left artificial shoulder joint: Secondary | ICD-10-CM | POA: Diagnosis not present

## 2020-03-05 DIAGNOSIS — L03119 Cellulitis of unspecified part of limb: Secondary | ICD-10-CM | POA: Diagnosis not present

## 2020-03-05 DIAGNOSIS — Z7982 Long term (current) use of aspirin: Secondary | ICD-10-CM | POA: Diagnosis not present

## 2020-03-05 DIAGNOSIS — M4807 Spinal stenosis, lumbosacral region: Secondary | ICD-10-CM | POA: Diagnosis not present

## 2020-03-05 DIAGNOSIS — R112 Nausea with vomiting, unspecified: Secondary | ICD-10-CM | POA: Diagnosis not present

## 2020-03-05 DIAGNOSIS — I700 Atherosclerosis of aorta: Secondary | ICD-10-CM | POA: Diagnosis not present

## 2020-03-05 DIAGNOSIS — E860 Dehydration: Secondary | ICD-10-CM | POA: Diagnosis not present

## 2020-03-05 DIAGNOSIS — M8580 Other specified disorders of bone density and structure, unspecified site: Secondary | ICD-10-CM | POA: Diagnosis not present

## 2020-03-05 DIAGNOSIS — M6281 Muscle weakness (generalized): Secondary | ICD-10-CM | POA: Diagnosis not present

## 2020-03-05 DIAGNOSIS — R29810 Facial weakness: Secondary | ICD-10-CM | POA: Diagnosis not present

## 2020-03-05 DIAGNOSIS — R21 Rash and other nonspecific skin eruption: Secondary | ICD-10-CM | POA: Diagnosis not present

## 2020-03-05 DIAGNOSIS — Z853 Personal history of malignant neoplasm of breast: Secondary | ICD-10-CM | POA: Diagnosis not present

## 2020-03-05 DIAGNOSIS — E559 Vitamin D deficiency, unspecified: Secondary | ICD-10-CM | POA: Diagnosis not present

## 2020-03-05 DIAGNOSIS — Z8679 Personal history of other diseases of the circulatory system: Secondary | ICD-10-CM | POA: Diagnosis not present

## 2020-03-05 DIAGNOSIS — I639 Cerebral infarction, unspecified: Secondary | ICD-10-CM | POA: Diagnosis not present

## 2020-03-05 DIAGNOSIS — I509 Heart failure, unspecified: Secondary | ICD-10-CM | POA: Diagnosis not present

## 2020-03-05 DIAGNOSIS — R4020 Unspecified coma: Secondary | ICD-10-CM | POA: Diagnosis not present

## 2020-03-05 DIAGNOSIS — N189 Chronic kidney disease, unspecified: Secondary | ICD-10-CM | POA: Diagnosis not present

## 2020-03-05 DIAGNOSIS — M5442 Lumbago with sciatica, left side: Secondary | ICD-10-CM | POA: Diagnosis not present

## 2020-03-05 DIAGNOSIS — Z794 Long term (current) use of insulin: Secondary | ICD-10-CM | POA: Diagnosis not present

## 2020-03-05 DIAGNOSIS — I5033 Acute on chronic diastolic (congestive) heart failure: Secondary | ICD-10-CM | POA: Diagnosis not present

## 2020-03-05 DIAGNOSIS — E1159 Type 2 diabetes mellitus with other circulatory complications: Secondary | ICD-10-CM | POA: Diagnosis not present

## 2020-03-05 DIAGNOSIS — G4733 Obstructive sleep apnea (adult) (pediatric): Secondary | ICD-10-CM | POA: Diagnosis not present

## 2020-03-05 DIAGNOSIS — S6992XA Unspecified injury of left wrist, hand and finger(s), initial encounter: Secondary | ICD-10-CM | POA: Diagnosis not present

## 2020-03-05 DIAGNOSIS — D72829 Elevated white blood cell count, unspecified: Secondary | ICD-10-CM | POA: Diagnosis not present

## 2020-03-05 DIAGNOSIS — T8141XA Infection following a procedure, superficial incisional surgical site, initial encounter: Secondary | ICD-10-CM | POA: Diagnosis not present

## 2020-03-05 DIAGNOSIS — M5126 Other intervertebral disc displacement, lumbar region: Secondary | ICD-10-CM | POA: Diagnosis not present

## 2020-03-05 DIAGNOSIS — I63012 Cerebral infarction due to thrombosis of left vertebral artery: Secondary | ICD-10-CM | POA: Diagnosis not present

## 2020-03-05 DIAGNOSIS — K047 Periapical abscess without sinus: Secondary | ICD-10-CM | POA: Diagnosis not present

## 2020-03-05 DIAGNOSIS — Z8719 Personal history of other diseases of the digestive system: Secondary | ICD-10-CM | POA: Diagnosis not present

## 2020-03-05 DIAGNOSIS — M542 Cervicalgia: Secondary | ICD-10-CM | POA: Diagnosis not present

## 2020-03-05 DIAGNOSIS — J0100 Acute maxillary sinusitis, unspecified: Secondary | ICD-10-CM | POA: Diagnosis not present

## 2020-03-05 DIAGNOSIS — J8410 Pulmonary fibrosis, unspecified: Secondary | ICD-10-CM | POA: Diagnosis not present

## 2020-03-05 DIAGNOSIS — R519 Headache, unspecified: Secondary | ICD-10-CM | POA: Diagnosis not present

## 2020-03-05 DIAGNOSIS — G8929 Other chronic pain: Secondary | ICD-10-CM | POA: Diagnosis not present

## 2020-03-05 DIAGNOSIS — J45901 Unspecified asthma with (acute) exacerbation: Secondary | ICD-10-CM | POA: Diagnosis not present

## 2020-03-05 DIAGNOSIS — R000 Tachycardia, unspecified: Secondary | ICD-10-CM | POA: Diagnosis not present

## 2020-03-05 DIAGNOSIS — Z79899 Other long term (current) drug therapy: Secondary | ICD-10-CM | POA: Diagnosis not present

## 2020-03-05 DIAGNOSIS — R05 Cough: Secondary | ICD-10-CM | POA: Diagnosis not present

## 2020-03-05 DIAGNOSIS — Z8639 Personal history of other endocrine, nutritional and metabolic disease: Secondary | ICD-10-CM | POA: Diagnosis not present

## 2020-03-05 DIAGNOSIS — I872 Venous insufficiency (chronic) (peripheral): Secondary | ICD-10-CM | POA: Diagnosis not present

## 2020-03-05 DIAGNOSIS — L03115 Cellulitis of right lower limb: Secondary | ICD-10-CM | POA: Diagnosis not present

## 2020-03-05 DIAGNOSIS — G9340 Encephalopathy, unspecified: Secondary | ICD-10-CM | POA: Diagnosis not present

## 2020-03-05 DIAGNOSIS — Z87891 Personal history of nicotine dependence: Secondary | ICD-10-CM | POA: Diagnosis not present

## 2020-03-05 DIAGNOSIS — K5790 Diverticulosis of intestine, part unspecified, without perforation or abscess without bleeding: Secondary | ICD-10-CM | POA: Diagnosis not present

## 2020-03-05 DIAGNOSIS — N390 Urinary tract infection, site not specified: Secondary | ICD-10-CM | POA: Diagnosis not present

## 2020-03-05 DIAGNOSIS — H9191 Unspecified hearing loss, right ear: Secondary | ICD-10-CM | POA: Diagnosis not present

## 2020-03-05 DIAGNOSIS — M109 Gout, unspecified: Secondary | ICD-10-CM | POA: Diagnosis not present

## 2020-03-05 DIAGNOSIS — E1169 Type 2 diabetes mellitus with other specified complication: Secondary | ICD-10-CM | POA: Diagnosis not present

## 2020-03-05 DIAGNOSIS — Z880 Allergy status to penicillin: Secondary | ICD-10-CM | POA: Diagnosis not present

## 2020-03-05 DIAGNOSIS — N183 Chronic kidney disease, stage 3 unspecified: Secondary | ICD-10-CM | POA: Diagnosis not present

## 2020-03-05 DIAGNOSIS — J439 Emphysema, unspecified: Secondary | ICD-10-CM | POA: Diagnosis not present

## 2020-03-05 DIAGNOSIS — S52032A Displaced fracture of olecranon process with intraarticular extension of left ulna, initial encounter for closed fracture: Secondary | ICD-10-CM | POA: Diagnosis not present

## 2020-03-05 DIAGNOSIS — M1990 Unspecified osteoarthritis, unspecified site: Secondary | ICD-10-CM | POA: Diagnosis not present

## 2020-03-06 DIAGNOSIS — Z6841 Body Mass Index (BMI) 40.0 and over, adult: Secondary | ICD-10-CM | POA: Diagnosis not present

## 2020-03-06 DIAGNOSIS — K769 Liver disease, unspecified: Secondary | ICD-10-CM | POA: Diagnosis not present

## 2020-03-06 DIAGNOSIS — F331 Major depressive disorder, recurrent, moderate: Secondary | ICD-10-CM | POA: Diagnosis not present

## 2020-03-06 DIAGNOSIS — C8408 Mycosis fungoides, lymph nodes of multiple sites: Secondary | ICD-10-CM | POA: Diagnosis not present

## 2020-03-06 DIAGNOSIS — H02886 Meibomian gland dysfunction of left eye, unspecified eyelid: Secondary | ICD-10-CM | POA: Diagnosis not present

## 2020-03-06 DIAGNOSIS — Z8679 Personal history of other diseases of the circulatory system: Secondary | ICD-10-CM | POA: Diagnosis not present

## 2020-03-06 DIAGNOSIS — M5417 Radiculopathy, lumbosacral region: Secondary | ICD-10-CM | POA: Diagnosis not present

## 2020-03-06 DIAGNOSIS — K219 Gastro-esophageal reflux disease without esophagitis: Secondary | ICD-10-CM | POA: Diagnosis not present

## 2020-03-06 DIAGNOSIS — C8318 Mantle cell lymphoma, lymph nodes of multiple sites: Secondary | ICD-10-CM | POA: Diagnosis not present

## 2020-03-06 DIAGNOSIS — R296 Repeated falls: Secondary | ICD-10-CM | POA: Diagnosis not present

## 2020-03-06 DIAGNOSIS — E669 Obesity, unspecified: Secondary | ICD-10-CM | POA: Diagnosis not present

## 2020-03-06 DIAGNOSIS — H26493 Other secondary cataract, bilateral: Secondary | ICD-10-CM | POA: Diagnosis not present

## 2020-03-06 DIAGNOSIS — Z6820 Body mass index (BMI) 20.0-20.9, adult: Secondary | ICD-10-CM | POA: Diagnosis not present

## 2020-03-06 DIAGNOSIS — R0989 Other specified symptoms and signs involving the circulatory and respiratory systems: Secondary | ICD-10-CM | POA: Diagnosis not present

## 2020-03-06 DIAGNOSIS — L089 Local infection of the skin and subcutaneous tissue, unspecified: Secondary | ICD-10-CM | POA: Diagnosis not present

## 2020-03-06 DIAGNOSIS — R279 Unspecified lack of coordination: Secondary | ICD-10-CM | POA: Diagnosis not present

## 2020-03-06 DIAGNOSIS — Z8546 Personal history of malignant neoplasm of prostate: Secondary | ICD-10-CM | POA: Diagnosis not present

## 2020-03-06 DIAGNOSIS — M25461 Effusion, right knee: Secondary | ICD-10-CM | POA: Diagnosis not present

## 2020-03-06 DIAGNOSIS — Z139 Encounter for screening, unspecified: Secondary | ICD-10-CM | POA: Diagnosis not present

## 2020-03-06 DIAGNOSIS — H40031 Anatomical narrow angle, right eye: Secondary | ICD-10-CM | POA: Diagnosis not present

## 2020-03-06 DIAGNOSIS — M7989 Other specified soft tissue disorders: Secondary | ICD-10-CM | POA: Diagnosis not present

## 2020-03-06 DIAGNOSIS — F319 Bipolar disorder, unspecified: Secondary | ICD-10-CM | POA: Diagnosis not present

## 2020-03-06 DIAGNOSIS — H35371 Puckering of macula, right eye: Secondary | ICD-10-CM | POA: Diagnosis not present

## 2020-03-06 DIAGNOSIS — E559 Vitamin D deficiency, unspecified: Secondary | ICD-10-CM | POA: Diagnosis not present

## 2020-03-06 DIAGNOSIS — M85851 Other specified disorders of bone density and structure, right thigh: Secondary | ICD-10-CM | POA: Diagnosis not present

## 2020-03-06 DIAGNOSIS — I609 Nontraumatic subarachnoid hemorrhage, unspecified: Secondary | ICD-10-CM | POA: Diagnosis not present

## 2020-03-06 DIAGNOSIS — R6520 Severe sepsis without septic shock: Secondary | ICD-10-CM | POA: Diagnosis not present

## 2020-03-06 DIAGNOSIS — F4323 Adjustment disorder with mixed anxiety and depressed mood: Secondary | ICD-10-CM | POA: Diagnosis not present

## 2020-03-06 DIAGNOSIS — R1012 Left upper quadrant pain: Secondary | ICD-10-CM | POA: Diagnosis not present

## 2020-03-06 DIAGNOSIS — G43019 Migraine without aura, intractable, without status migrainosus: Secondary | ICD-10-CM | POA: Diagnosis not present

## 2020-03-06 DIAGNOSIS — Z7901 Long term (current) use of anticoagulants: Secondary | ICD-10-CM | POA: Diagnosis not present

## 2020-03-06 DIAGNOSIS — M2550 Pain in unspecified joint: Secondary | ICD-10-CM | POA: Diagnosis not present

## 2020-03-06 DIAGNOSIS — F3289 Other specified depressive episodes: Secondary | ICD-10-CM | POA: Diagnosis not present

## 2020-03-06 DIAGNOSIS — G259 Extrapyramidal and movement disorder, unspecified: Secondary | ICD-10-CM | POA: Diagnosis not present

## 2020-03-06 DIAGNOSIS — L570 Actinic keratosis: Secondary | ICD-10-CM | POA: Diagnosis not present

## 2020-03-06 DIAGNOSIS — M8589 Other specified disorders of bone density and structure, multiple sites: Secondary | ICD-10-CM | POA: Diagnosis not present

## 2020-03-06 DIAGNOSIS — Z6830 Body mass index (BMI) 30.0-30.9, adult: Secondary | ICD-10-CM | POA: Diagnosis not present

## 2020-03-06 DIAGNOSIS — Z8042 Family history of malignant neoplasm of prostate: Secondary | ICD-10-CM | POA: Diagnosis not present

## 2020-03-06 DIAGNOSIS — A4901 Methicillin susceptible Staphylococcus aureus infection, unspecified site: Secondary | ICD-10-CM | POA: Diagnosis not present

## 2020-03-06 DIAGNOSIS — L910 Hypertrophic scar: Secondary | ICD-10-CM | POA: Diagnosis not present

## 2020-03-06 DIAGNOSIS — D510 Vitamin B12 deficiency anemia due to intrinsic factor deficiency: Secondary | ICD-10-CM | POA: Diagnosis not present

## 2020-03-06 DIAGNOSIS — E1065 Type 1 diabetes mellitus with hyperglycemia: Secondary | ICD-10-CM | POA: Diagnosis not present

## 2020-03-06 DIAGNOSIS — I2510 Atherosclerotic heart disease of native coronary artery without angina pectoris: Secondary | ICD-10-CM | POA: Diagnosis not present

## 2020-03-06 DIAGNOSIS — L237 Allergic contact dermatitis due to plants, except food: Secondary | ICD-10-CM | POA: Diagnosis not present

## 2020-03-06 DIAGNOSIS — L97812 Non-pressure chronic ulcer of other part of right lower leg with fat layer exposed: Secondary | ICD-10-CM | POA: Diagnosis not present

## 2020-03-06 DIAGNOSIS — J029 Acute pharyngitis, unspecified: Secondary | ICD-10-CM | POA: Diagnosis not present

## 2020-03-06 DIAGNOSIS — S62306A Unspecified fracture of fifth metacarpal bone, right hand, initial encounter for closed fracture: Secondary | ICD-10-CM | POA: Diagnosis not present

## 2020-03-06 DIAGNOSIS — K649 Unspecified hemorrhoids: Secondary | ICD-10-CM | POA: Diagnosis not present

## 2020-03-06 DIAGNOSIS — M9903 Segmental and somatic dysfunction of lumbar region: Secondary | ICD-10-CM | POA: Diagnosis not present

## 2020-03-06 DIAGNOSIS — N23 Unspecified renal colic: Secondary | ICD-10-CM | POA: Diagnosis not present

## 2020-03-06 DIAGNOSIS — N951 Menopausal and female climacteric states: Secondary | ICD-10-CM | POA: Diagnosis not present

## 2020-03-06 DIAGNOSIS — H401132 Primary open-angle glaucoma, bilateral, moderate stage: Secondary | ICD-10-CM | POA: Diagnosis not present

## 2020-03-06 DIAGNOSIS — D631 Anemia in chronic kidney disease: Secondary | ICD-10-CM | POA: Diagnosis not present

## 2020-03-06 DIAGNOSIS — M530 Cervicocranial syndrome: Secondary | ICD-10-CM | POA: Diagnosis not present

## 2020-03-06 DIAGNOSIS — M0579 Rheumatoid arthritis with rheumatoid factor of multiple sites without organ or systems involvement: Secondary | ICD-10-CM | POA: Diagnosis not present

## 2020-03-06 DIAGNOSIS — G47419 Narcolepsy without cataplexy: Secondary | ICD-10-CM | POA: Diagnosis not present

## 2020-03-06 DIAGNOSIS — S3992XA Unspecified injury of lower back, initial encounter: Secondary | ICD-10-CM | POA: Diagnosis not present

## 2020-03-06 DIAGNOSIS — W010XXA Fall on same level from slipping, tripping and stumbling without subsequent striking against object, initial encounter: Secondary | ICD-10-CM | POA: Diagnosis not present

## 2020-03-06 DIAGNOSIS — S62326A Displaced fracture of shaft of fifth metacarpal bone, right hand, initial encounter for closed fracture: Secondary | ICD-10-CM | POA: Diagnosis not present

## 2020-03-06 DIAGNOSIS — M4120 Other idiopathic scoliosis, site unspecified: Secondary | ICD-10-CM | POA: Diagnosis not present

## 2020-03-06 DIAGNOSIS — R1033 Periumbilical pain: Secondary | ICD-10-CM | POA: Diagnosis not present

## 2020-03-06 DIAGNOSIS — Z0001 Encounter for general adult medical examination with abnormal findings: Secondary | ICD-10-CM | POA: Diagnosis not present

## 2020-03-06 DIAGNOSIS — Z130 Encounter for screening for diseases of the blood and blood-forming organs and certain disorders involving the immune mechanism: Secondary | ICD-10-CM | POA: Diagnosis not present

## 2020-03-06 DIAGNOSIS — S336XXA Sprain of sacroiliac joint, initial encounter: Secondary | ICD-10-CM | POA: Diagnosis not present

## 2020-03-06 DIAGNOSIS — M1712 Unilateral primary osteoarthritis, left knee: Secondary | ICD-10-CM | POA: Diagnosis not present

## 2020-03-06 DIAGNOSIS — D500 Iron deficiency anemia secondary to blood loss (chronic): Secondary | ICD-10-CM | POA: Diagnosis not present

## 2020-03-06 DIAGNOSIS — M48061 Spinal stenosis, lumbar region without neurogenic claudication: Secondary | ICD-10-CM | POA: Diagnosis not present

## 2020-03-06 DIAGNOSIS — H0288B Meibomian gland dysfunction left eye, upper and lower eyelids: Secondary | ICD-10-CM | POA: Diagnosis not present

## 2020-03-06 DIAGNOSIS — M6701 Short Achilles tendon (acquired), right ankle: Secondary | ICD-10-CM | POA: Diagnosis not present

## 2020-03-06 DIAGNOSIS — I472 Ventricular tachycardia: Secondary | ICD-10-CM | POA: Diagnosis not present

## 2020-03-06 DIAGNOSIS — S61411A Laceration without foreign body of right hand, initial encounter: Secondary | ICD-10-CM | POA: Diagnosis not present

## 2020-03-06 DIAGNOSIS — S6992XD Unspecified injury of left wrist, hand and finger(s), subsequent encounter: Secondary | ICD-10-CM | POA: Diagnosis not present

## 2020-03-06 DIAGNOSIS — S60512A Abrasion of left hand, initial encounter: Secondary | ICD-10-CM | POA: Diagnosis not present

## 2020-03-06 DIAGNOSIS — H5462 Unqualified visual loss, left eye, normal vision right eye: Secondary | ICD-10-CM | POA: Diagnosis not present

## 2020-03-06 DIAGNOSIS — G720 Drug-induced myopathy: Secondary | ICD-10-CM | POA: Diagnosis not present

## 2020-03-06 DIAGNOSIS — L0390 Cellulitis, unspecified: Secondary | ICD-10-CM | POA: Diagnosis not present

## 2020-03-06 DIAGNOSIS — K635 Polyp of colon: Secondary | ICD-10-CM | POA: Diagnosis not present

## 2020-03-06 DIAGNOSIS — I25709 Atherosclerosis of coronary artery bypass graft(s), unspecified, with unspecified angina pectoris: Secondary | ICD-10-CM | POA: Diagnosis not present

## 2020-03-06 DIAGNOSIS — H81399 Other peripheral vertigo, unspecified ear: Secondary | ICD-10-CM | POA: Diagnosis not present

## 2020-03-06 DIAGNOSIS — R590 Localized enlarged lymph nodes: Secondary | ICD-10-CM | POA: Diagnosis not present

## 2020-03-06 DIAGNOSIS — H26491 Other secondary cataract, right eye: Secondary | ICD-10-CM | POA: Diagnosis not present

## 2020-03-06 DIAGNOSIS — C169 Malignant neoplasm of stomach, unspecified: Secondary | ICD-10-CM | POA: Diagnosis not present

## 2020-03-06 DIAGNOSIS — I5030 Unspecified diastolic (congestive) heart failure: Secondary | ICD-10-CM | POA: Diagnosis not present

## 2020-03-06 DIAGNOSIS — Z510 Encounter for antineoplastic radiation therapy: Secondary | ICD-10-CM | POA: Diagnosis not present

## 2020-03-06 DIAGNOSIS — B373 Candidiasis of vulva and vagina: Secondary | ICD-10-CM | POA: Diagnosis not present

## 2020-03-06 DIAGNOSIS — S199XXA Unspecified injury of neck, initial encounter: Secondary | ICD-10-CM | POA: Diagnosis not present

## 2020-03-06 DIAGNOSIS — K5750 Diverticulosis of both small and large intestine without perforation or abscess without bleeding: Secondary | ICD-10-CM | POA: Diagnosis not present

## 2020-03-06 DIAGNOSIS — F320 Major depressive disorder, single episode, mild: Secondary | ICD-10-CM | POA: Diagnosis not present

## 2020-03-06 DIAGNOSIS — E1142 Type 2 diabetes mellitus with diabetic polyneuropathy: Secondary | ICD-10-CM | POA: Diagnosis not present

## 2020-03-06 DIAGNOSIS — C439 Malignant melanoma of skin, unspecified: Secondary | ICD-10-CM | POA: Diagnosis not present

## 2020-03-06 DIAGNOSIS — M542 Cervicalgia: Secondary | ICD-10-CM | POA: Diagnosis not present

## 2020-03-06 DIAGNOSIS — H0102B Squamous blepharitis left eye, upper and lower eyelids: Secondary | ICD-10-CM | POA: Diagnosis not present

## 2020-03-06 DIAGNOSIS — E875 Hyperkalemia: Secondary | ICD-10-CM | POA: Diagnosis not present

## 2020-03-06 DIAGNOSIS — H02883 Meibomian gland dysfunction of right eye, unspecified eyelid: Secondary | ICD-10-CM | POA: Diagnosis not present

## 2020-03-06 DIAGNOSIS — Z6837 Body mass index (BMI) 37.0-37.9, adult: Secondary | ICD-10-CM | POA: Diagnosis not present

## 2020-03-06 DIAGNOSIS — S80869A Insect bite (nonvenomous), unspecified lower leg, initial encounter: Secondary | ICD-10-CM | POA: Diagnosis not present

## 2020-03-06 DIAGNOSIS — N39490 Overflow incontinence: Secondary | ICD-10-CM | POA: Diagnosis not present

## 2020-03-06 DIAGNOSIS — H40003 Preglaucoma, unspecified, bilateral: Secondary | ICD-10-CM | POA: Diagnosis not present

## 2020-03-06 DIAGNOSIS — Z4509 Encounter for adjustment and management of other cardiac device: Secondary | ICD-10-CM | POA: Diagnosis not present

## 2020-03-06 DIAGNOSIS — N189 Chronic kidney disease, unspecified: Secondary | ICD-10-CM | POA: Diagnosis not present

## 2020-03-06 DIAGNOSIS — R531 Weakness: Secondary | ICD-10-CM | POA: Diagnosis not present

## 2020-03-06 DIAGNOSIS — L309 Dermatitis, unspecified: Secondary | ICD-10-CM | POA: Diagnosis not present

## 2020-03-06 DIAGNOSIS — M5431 Sciatica, right side: Secondary | ICD-10-CM | POA: Diagnosis not present

## 2020-03-06 DIAGNOSIS — F418 Other specified anxiety disorders: Secondary | ICD-10-CM | POA: Diagnosis not present

## 2020-03-06 DIAGNOSIS — J690 Pneumonitis due to inhalation of food and vomit: Secondary | ICD-10-CM | POA: Diagnosis not present

## 2020-03-06 DIAGNOSIS — Z129 Encounter for screening for malignant neoplasm, site unspecified: Secondary | ICD-10-CM | POA: Diagnosis not present

## 2020-03-06 DIAGNOSIS — H353122 Nonexudative age-related macular degeneration, left eye, intermediate dry stage: Secondary | ICD-10-CM | POA: Diagnosis not present

## 2020-03-06 DIAGNOSIS — Z810 Family history of intellectual disabilities: Secondary | ICD-10-CM | POA: Diagnosis not present

## 2020-03-06 DIAGNOSIS — K922 Gastrointestinal hemorrhage, unspecified: Secondary | ICD-10-CM | POA: Diagnosis not present

## 2020-03-06 DIAGNOSIS — K5730 Diverticulosis of large intestine without perforation or abscess without bleeding: Secondary | ICD-10-CM | POA: Diagnosis not present

## 2020-03-06 DIAGNOSIS — Z952 Presence of prosthetic heart valve: Secondary | ICD-10-CM | POA: Diagnosis not present

## 2020-03-06 DIAGNOSIS — M7981 Nontraumatic hematoma of soft tissue: Secondary | ICD-10-CM | POA: Diagnosis not present

## 2020-03-06 DIAGNOSIS — C44529 Squamous cell carcinoma of skin of other part of trunk: Secondary | ICD-10-CM | POA: Diagnosis not present

## 2020-03-06 DIAGNOSIS — L2089 Other atopic dermatitis: Secondary | ICD-10-CM | POA: Diagnosis not present

## 2020-03-06 DIAGNOSIS — I959 Hypotension, unspecified: Secondary | ICD-10-CM | POA: Diagnosis not present

## 2020-03-06 DIAGNOSIS — J3089 Other allergic rhinitis: Secondary | ICD-10-CM | POA: Diagnosis not present

## 2020-03-06 DIAGNOSIS — L97312 Non-pressure chronic ulcer of right ankle with fat layer exposed: Secondary | ICD-10-CM | POA: Diagnosis not present

## 2020-03-06 DIAGNOSIS — K449 Diaphragmatic hernia without obstruction or gangrene: Secondary | ICD-10-CM | POA: Diagnosis not present

## 2020-03-06 DIAGNOSIS — Z23 Encounter for immunization: Secondary | ICD-10-CM | POA: Diagnosis not present

## 2020-03-06 DIAGNOSIS — Z66 Do not resuscitate: Secondary | ICD-10-CM | POA: Diagnosis not present

## 2020-03-06 DIAGNOSIS — B351 Tinea unguium: Secondary | ICD-10-CM | POA: Diagnosis not present

## 2020-03-06 DIAGNOSIS — F39 Unspecified mood [affective] disorder: Secondary | ICD-10-CM | POA: Diagnosis not present

## 2020-03-06 DIAGNOSIS — Z5320 Procedure and treatment not carried out because of patient's decision for unspecified reasons: Secondary | ICD-10-CM | POA: Diagnosis not present

## 2020-03-06 DIAGNOSIS — H35372 Puckering of macula, left eye: Secondary | ICD-10-CM | POA: Diagnosis not present

## 2020-03-06 DIAGNOSIS — X32XXXD Exposure to sunlight, subsequent encounter: Secondary | ICD-10-CM | POA: Diagnosis not present

## 2020-03-06 DIAGNOSIS — R778 Other specified abnormalities of plasma proteins: Secondary | ICD-10-CM | POA: Diagnosis not present

## 2020-03-06 DIAGNOSIS — Z131 Encounter for screening for diabetes mellitus: Secondary | ICD-10-CM | POA: Diagnosis not present

## 2020-03-06 DIAGNOSIS — M80052D Age-related osteoporosis with current pathological fracture, left femur, subsequent encounter for fracture with routine healing: Secondary | ICD-10-CM | POA: Diagnosis not present

## 2020-03-06 DIAGNOSIS — F40240 Claustrophobia: Secondary | ICD-10-CM | POA: Diagnosis not present

## 2020-03-06 DIAGNOSIS — Z049 Encounter for examination and observation for unspecified reason: Secondary | ICD-10-CM | POA: Diagnosis not present

## 2020-03-06 DIAGNOSIS — J8410 Pulmonary fibrosis, unspecified: Secondary | ICD-10-CM | POA: Diagnosis not present

## 2020-03-06 DIAGNOSIS — I351 Nonrheumatic aortic (valve) insufficiency: Secondary | ICD-10-CM | POA: Diagnosis not present

## 2020-03-06 DIAGNOSIS — M7918 Myalgia, other site: Secondary | ICD-10-CM | POA: Diagnosis not present

## 2020-03-06 DIAGNOSIS — I8310 Varicose veins of unspecified lower extremity with inflammation: Secondary | ICD-10-CM | POA: Diagnosis not present

## 2020-03-06 DIAGNOSIS — B182 Chronic viral hepatitis C: Secondary | ICD-10-CM | POA: Diagnosis not present

## 2020-03-06 DIAGNOSIS — T63421D Toxic effect of venom of ants, accidental (unintentional), subsequent encounter: Secondary | ICD-10-CM | POA: Diagnosis not present

## 2020-03-06 DIAGNOSIS — R5383 Other fatigue: Secondary | ICD-10-CM | POA: Diagnosis not present

## 2020-03-06 DIAGNOSIS — F0150 Vascular dementia without behavioral disturbance: Secondary | ICD-10-CM | POA: Diagnosis not present

## 2020-03-06 DIAGNOSIS — M4322 Fusion of spine, cervical region: Secondary | ICD-10-CM | POA: Diagnosis not present

## 2020-03-06 DIAGNOSIS — Z791 Long term (current) use of non-steroidal anti-inflammatories (NSAID): Secondary | ICD-10-CM | POA: Diagnosis not present

## 2020-03-06 DIAGNOSIS — J84112 Idiopathic pulmonary fibrosis: Secondary | ICD-10-CM | POA: Diagnosis not present

## 2020-03-06 DIAGNOSIS — R87619 Unspecified abnormal cytological findings in specimens from cervix uteri: Secondary | ICD-10-CM | POA: Diagnosis not present

## 2020-03-06 DIAGNOSIS — M5481 Occipital neuralgia: Secondary | ICD-10-CM | POA: Diagnosis not present

## 2020-03-06 DIAGNOSIS — Z780 Asymptomatic menopausal state: Secondary | ICD-10-CM | POA: Diagnosis not present

## 2020-03-06 DIAGNOSIS — S41112D Laceration without foreign body of left upper arm, subsequent encounter: Secondary | ICD-10-CM | POA: Diagnosis not present

## 2020-03-06 DIAGNOSIS — M7631 Iliotibial band syndrome, right leg: Secondary | ICD-10-CM | POA: Diagnosis not present

## 2020-03-06 DIAGNOSIS — G8929 Other chronic pain: Secondary | ICD-10-CM | POA: Diagnosis not present

## 2020-03-06 DIAGNOSIS — M5020 Other cervical disc displacement, unspecified cervical region: Secondary | ICD-10-CM | POA: Diagnosis not present

## 2020-03-06 DIAGNOSIS — Z886 Allergy status to analgesic agent status: Secondary | ICD-10-CM | POA: Diagnosis not present

## 2020-03-06 DIAGNOSIS — L308 Other specified dermatitis: Secondary | ICD-10-CM | POA: Diagnosis not present

## 2020-03-06 DIAGNOSIS — Z1231 Encounter for screening mammogram for malignant neoplasm of breast: Secondary | ICD-10-CM | POA: Diagnosis not present

## 2020-03-06 DIAGNOSIS — L932 Other local lupus erythematosus: Secondary | ICD-10-CM | POA: Diagnosis not present

## 2020-03-06 DIAGNOSIS — H353124 Nonexudative age-related macular degeneration, left eye, advanced atrophic with subfoveal involvement: Secondary | ICD-10-CM | POA: Diagnosis not present

## 2020-03-06 DIAGNOSIS — J342 Deviated nasal septum: Secondary | ICD-10-CM | POA: Diagnosis not present

## 2020-03-06 DIAGNOSIS — I9589 Other hypotension: Secondary | ICD-10-CM | POA: Diagnosis not present

## 2020-03-06 DIAGNOSIS — N952 Postmenopausal atrophic vaginitis: Secondary | ICD-10-CM | POA: Diagnosis not present

## 2020-03-06 DIAGNOSIS — M5134 Other intervertebral disc degeneration, thoracic region: Secondary | ICD-10-CM | POA: Diagnosis not present

## 2020-03-06 DIAGNOSIS — M170 Bilateral primary osteoarthritis of knee: Secondary | ICD-10-CM | POA: Diagnosis not present

## 2020-03-06 DIAGNOSIS — M545 Low back pain: Secondary | ICD-10-CM | POA: Diagnosis not present

## 2020-03-06 DIAGNOSIS — H04123 Dry eye syndrome of bilateral lacrimal glands: Secondary | ICD-10-CM | POA: Diagnosis not present

## 2020-03-06 DIAGNOSIS — R6884 Jaw pain: Secondary | ICD-10-CM | POA: Diagnosis not present

## 2020-03-06 DIAGNOSIS — N184 Chronic kidney disease, stage 4 (severe): Secondary | ICD-10-CM | POA: Diagnosis not present

## 2020-03-06 DIAGNOSIS — I43 Cardiomyopathy in diseases classified elsewhere: Secondary | ICD-10-CM | POA: Diagnosis not present

## 2020-03-06 DIAGNOSIS — Z8701 Personal history of pneumonia (recurrent): Secondary | ICD-10-CM | POA: Diagnosis not present

## 2020-03-06 DIAGNOSIS — Z981 Arthrodesis status: Secondary | ICD-10-CM | POA: Diagnosis not present

## 2020-03-06 DIAGNOSIS — Z125 Encounter for screening for malignant neoplasm of prostate: Secondary | ICD-10-CM | POA: Diagnosis not present

## 2020-03-06 DIAGNOSIS — M329 Systemic lupus erythematosus, unspecified: Secondary | ICD-10-CM | POA: Diagnosis not present

## 2020-03-06 DIAGNOSIS — H109 Unspecified conjunctivitis: Secondary | ICD-10-CM | POA: Diagnosis not present

## 2020-03-06 DIAGNOSIS — C44722 Squamous cell carcinoma of skin of right lower limb, including hip: Secondary | ICD-10-CM | POA: Diagnosis not present

## 2020-03-06 DIAGNOSIS — E0590 Thyrotoxicosis, unspecified without thyrotoxic crisis or storm: Secondary | ICD-10-CM | POA: Diagnosis not present

## 2020-03-06 DIAGNOSIS — G5601 Carpal tunnel syndrome, right upper limb: Secondary | ICD-10-CM | POA: Diagnosis not present

## 2020-03-06 DIAGNOSIS — M5030 Other cervical disc degeneration, unspecified cervical region: Secondary | ICD-10-CM | POA: Diagnosis not present

## 2020-03-06 DIAGNOSIS — T63461A Toxic effect of venom of wasps, accidental (unintentional), initial encounter: Secondary | ICD-10-CM | POA: Diagnosis not present

## 2020-03-06 DIAGNOSIS — C50912 Malignant neoplasm of unspecified site of left female breast: Secondary | ICD-10-CM | POA: Diagnosis not present

## 2020-03-06 DIAGNOSIS — G9340 Encephalopathy, unspecified: Secondary | ICD-10-CM | POA: Diagnosis not present

## 2020-03-06 DIAGNOSIS — G4733 Obstructive sleep apnea (adult) (pediatric): Secondary | ICD-10-CM | POA: Diagnosis not present

## 2020-03-06 DIAGNOSIS — F321 Major depressive disorder, single episode, moderate: Secondary | ICD-10-CM | POA: Diagnosis not present

## 2020-03-06 DIAGNOSIS — F09 Unspecified mental disorder due to known physiological condition: Secondary | ICD-10-CM | POA: Diagnosis not present

## 2020-03-06 DIAGNOSIS — C44629 Squamous cell carcinoma of skin of left upper limb, including shoulder: Secondary | ICD-10-CM | POA: Diagnosis not present

## 2020-03-06 DIAGNOSIS — Z8781 Personal history of (healed) traumatic fracture: Secondary | ICD-10-CM | POA: Diagnosis not present

## 2020-03-06 DIAGNOSIS — C9110 Chronic lymphocytic leukemia of B-cell type not having achieved remission: Secondary | ICD-10-CM | POA: Diagnosis not present

## 2020-03-06 DIAGNOSIS — R1084 Generalized abdominal pain: Secondary | ICD-10-CM | POA: Diagnosis not present

## 2020-03-06 DIAGNOSIS — N281 Cyst of kidney, acquired: Secondary | ICD-10-CM | POA: Diagnosis not present

## 2020-03-06 DIAGNOSIS — F1120 Opioid dependence, uncomplicated: Secondary | ICD-10-CM | POA: Diagnosis not present

## 2020-03-06 DIAGNOSIS — N2581 Secondary hyperparathyroidism of renal origin: Secondary | ICD-10-CM | POA: Diagnosis not present

## 2020-03-06 DIAGNOSIS — I63233 Cerebral infarction due to unspecified occlusion or stenosis of bilateral carotid arteries: Secondary | ICD-10-CM | POA: Diagnosis not present

## 2020-03-06 DIAGNOSIS — M25569 Pain in unspecified knee: Secondary | ICD-10-CM | POA: Diagnosis not present

## 2020-03-06 DIAGNOSIS — D122 Benign neoplasm of ascending colon: Secondary | ICD-10-CM | POA: Diagnosis not present

## 2020-03-06 DIAGNOSIS — R1030 Lower abdominal pain, unspecified: Secondary | ICD-10-CM | POA: Diagnosis not present

## 2020-03-06 DIAGNOSIS — I4892 Unspecified atrial flutter: Secondary | ICD-10-CM | POA: Diagnosis not present

## 2020-03-06 DIAGNOSIS — E063 Autoimmune thyroiditis: Secondary | ICD-10-CM | POA: Diagnosis not present

## 2020-03-06 DIAGNOSIS — M4807 Spinal stenosis, lumbosacral region: Secondary | ICD-10-CM | POA: Diagnosis not present

## 2020-03-06 DIAGNOSIS — R438 Other disturbances of smell and taste: Secondary | ICD-10-CM | POA: Diagnosis not present

## 2020-03-06 DIAGNOSIS — M4802 Spinal stenosis, cervical region: Secondary | ICD-10-CM | POA: Diagnosis not present

## 2020-03-06 DIAGNOSIS — L720 Epidermal cyst: Secondary | ICD-10-CM | POA: Diagnosis not present

## 2020-03-06 DIAGNOSIS — E661 Drug-induced obesity: Secondary | ICD-10-CM | POA: Diagnosis not present

## 2020-03-06 DIAGNOSIS — R599 Enlarged lymph nodes, unspecified: Secondary | ICD-10-CM | POA: Diagnosis not present

## 2020-03-06 DIAGNOSIS — N319 Neuromuscular dysfunction of bladder, unspecified: Secondary | ICD-10-CM | POA: Diagnosis not present

## 2020-03-06 DIAGNOSIS — F3189 Other bipolar disorder: Secondary | ICD-10-CM | POA: Diagnosis not present

## 2020-03-06 DIAGNOSIS — I4891 Unspecified atrial fibrillation: Secondary | ICD-10-CM | POA: Diagnosis not present

## 2020-03-06 DIAGNOSIS — M19049 Primary osteoarthritis, unspecified hand: Secondary | ICD-10-CM | POA: Diagnosis not present

## 2020-03-06 DIAGNOSIS — M25641 Stiffness of right hand, not elsewhere classified: Secondary | ICD-10-CM | POA: Diagnosis not present

## 2020-03-06 DIAGNOSIS — L900 Lichen sclerosus et atrophicus: Secondary | ICD-10-CM | POA: Diagnosis not present

## 2020-03-06 DIAGNOSIS — K7469 Other cirrhosis of liver: Secondary | ICD-10-CM | POA: Diagnosis not present

## 2020-03-06 DIAGNOSIS — Q231 Congenital insufficiency of aortic valve: Secondary | ICD-10-CM | POA: Diagnosis not present

## 2020-03-06 DIAGNOSIS — Z8542 Personal history of malignant neoplasm of other parts of uterus: Secondary | ICD-10-CM | POA: Diagnosis not present

## 2020-03-06 DIAGNOSIS — S72144D Nondisplaced intertrochanteric fracture of right femur, subsequent encounter for closed fracture with routine healing: Secondary | ICD-10-CM | POA: Diagnosis not present

## 2020-03-06 DIAGNOSIS — I482 Chronic atrial fibrillation, unspecified: Secondary | ICD-10-CM | POA: Diagnosis not present

## 2020-03-06 DIAGNOSIS — H353113 Nonexudative age-related macular degeneration, right eye, advanced atrophic without subfoveal involvement: Secondary | ICD-10-CM | POA: Diagnosis not present

## 2020-03-06 DIAGNOSIS — Z933 Colostomy status: Secondary | ICD-10-CM | POA: Diagnosis not present

## 2020-03-06 DIAGNOSIS — D120 Benign neoplasm of cecum: Secondary | ICD-10-CM | POA: Diagnosis not present

## 2020-03-06 DIAGNOSIS — H3530 Unspecified macular degeneration: Secondary | ICD-10-CM | POA: Diagnosis not present

## 2020-03-06 DIAGNOSIS — H43392 Other vitreous opacities, left eye: Secondary | ICD-10-CM | POA: Diagnosis not present

## 2020-03-06 DIAGNOSIS — S52609B Unspecified fracture of lower end of unspecified ulna, initial encounter for open fracture type I or II: Secondary | ICD-10-CM | POA: Diagnosis not present

## 2020-03-06 DIAGNOSIS — R509 Fever, unspecified: Secondary | ICD-10-CM | POA: Diagnosis not present

## 2020-03-06 DIAGNOSIS — S3993XA Unspecified injury of pelvis, initial encounter: Secondary | ICD-10-CM | POA: Diagnosis not present

## 2020-03-06 DIAGNOSIS — N8110 Cystocele, unspecified: Secondary | ICD-10-CM | POA: Diagnosis not present

## 2020-03-06 DIAGNOSIS — M06022 Rheumatoid arthritis without rheumatoid factor, left elbow: Secondary | ICD-10-CM | POA: Diagnosis not present

## 2020-03-06 DIAGNOSIS — Z01812 Encounter for preprocedural laboratory examination: Secondary | ICD-10-CM | POA: Diagnosis not present

## 2020-03-06 DIAGNOSIS — D2271 Melanocytic nevi of right lower limb, including hip: Secondary | ICD-10-CM | POA: Diagnosis not present

## 2020-03-06 DIAGNOSIS — J9611 Chronic respiratory failure with hypoxia: Secondary | ICD-10-CM | POA: Diagnosis not present

## 2020-03-06 DIAGNOSIS — Z885 Allergy status to narcotic agent status: Secondary | ICD-10-CM | POA: Diagnosis not present

## 2020-03-06 DIAGNOSIS — R2241 Localized swelling, mass and lump, right lower limb: Secondary | ICD-10-CM | POA: Diagnosis not present

## 2020-03-06 DIAGNOSIS — G4731 Primary central sleep apnea: Secondary | ICD-10-CM | POA: Diagnosis not present

## 2020-03-06 DIAGNOSIS — M859 Disorder of bone density and structure, unspecified: Secondary | ICD-10-CM | POA: Diagnosis not present

## 2020-03-06 DIAGNOSIS — R4020 Unspecified coma: Secondary | ICD-10-CM | POA: Diagnosis not present

## 2020-03-06 DIAGNOSIS — M25611 Stiffness of right shoulder, not elsewhere classified: Secondary | ICD-10-CM | POA: Diagnosis not present

## 2020-03-06 DIAGNOSIS — N201 Calculus of ureter: Secondary | ICD-10-CM | POA: Diagnosis not present

## 2020-03-06 DIAGNOSIS — M79642 Pain in left hand: Secondary | ICD-10-CM | POA: Diagnosis not present

## 2020-03-06 DIAGNOSIS — M79672 Pain in left foot: Secondary | ICD-10-CM | POA: Diagnosis not present

## 2020-03-06 DIAGNOSIS — K5909 Other constipation: Secondary | ICD-10-CM | POA: Diagnosis not present

## 2020-03-06 DIAGNOSIS — I429 Cardiomyopathy, unspecified: Secondary | ICD-10-CM | POA: Diagnosis not present

## 2020-03-06 DIAGNOSIS — K5900 Constipation, unspecified: Secondary | ICD-10-CM | POA: Diagnosis not present

## 2020-03-06 DIAGNOSIS — M8588 Other specified disorders of bone density and structure, other site: Secondary | ICD-10-CM | POA: Diagnosis not present

## 2020-03-06 DIAGNOSIS — T8141XA Infection following a procedure, superficial incisional surgical site, initial encounter: Secondary | ICD-10-CM | POA: Diagnosis not present

## 2020-03-06 DIAGNOSIS — Z6831 Body mass index (BMI) 31.0-31.9, adult: Secondary | ICD-10-CM | POA: Diagnosis not present

## 2020-03-06 DIAGNOSIS — Z89421 Acquired absence of other right toe(s): Secondary | ICD-10-CM | POA: Diagnosis not present

## 2020-03-06 DIAGNOSIS — D692 Other nonthrombocytopenic purpura: Secondary | ICD-10-CM | POA: Diagnosis not present

## 2020-03-06 DIAGNOSIS — L719 Rosacea, unspecified: Secondary | ICD-10-CM | POA: Diagnosis not present

## 2020-03-06 DIAGNOSIS — J45902 Unspecified asthma with status asthmaticus: Secondary | ICD-10-CM | POA: Diagnosis not present

## 2020-03-06 DIAGNOSIS — E1159 Type 2 diabetes mellitus with other circulatory complications: Secondary | ICD-10-CM | POA: Diagnosis not present

## 2020-03-06 DIAGNOSIS — N6315 Unspecified lump in the right breast, overlapping quadrants: Secondary | ICD-10-CM | POA: Diagnosis not present

## 2020-03-06 DIAGNOSIS — D735 Infarction of spleen: Secondary | ICD-10-CM | POA: Diagnosis not present

## 2020-03-06 DIAGNOSIS — K3184 Gastroparesis: Secondary | ICD-10-CM | POA: Diagnosis not present

## 2020-03-06 DIAGNOSIS — H6122 Impacted cerumen, left ear: Secondary | ICD-10-CM | POA: Diagnosis not present

## 2020-03-06 DIAGNOSIS — E876 Hypokalemia: Secondary | ICD-10-CM | POA: Diagnosis not present

## 2020-03-06 DIAGNOSIS — I071 Rheumatic tricuspid insufficiency: Secondary | ICD-10-CM | POA: Diagnosis not present

## 2020-03-06 DIAGNOSIS — I952 Hypotension due to drugs: Secondary | ICD-10-CM | POA: Diagnosis not present

## 2020-03-06 DIAGNOSIS — G3184 Mild cognitive impairment, so stated: Secondary | ICD-10-CM | POA: Diagnosis not present

## 2020-03-06 DIAGNOSIS — Z89411 Acquired absence of right great toe: Secondary | ICD-10-CM | POA: Diagnosis not present

## 2020-03-06 DIAGNOSIS — L97522 Non-pressure chronic ulcer of other part of left foot with fat layer exposed: Secondary | ICD-10-CM | POA: Diagnosis not present

## 2020-03-06 DIAGNOSIS — S72141D Displaced intertrochanteric fracture of right femur, subsequent encounter for closed fracture with routine healing: Secondary | ICD-10-CM | POA: Diagnosis not present

## 2020-03-06 DIAGNOSIS — Z961 Presence of intraocular lens: Secondary | ICD-10-CM | POA: Diagnosis not present

## 2020-03-06 DIAGNOSIS — K9189 Other postprocedural complications and disorders of digestive system: Secondary | ICD-10-CM | POA: Diagnosis not present

## 2020-03-06 DIAGNOSIS — G5602 Carpal tunnel syndrome, left upper limb: Secondary | ICD-10-CM | POA: Diagnosis not present

## 2020-03-06 DIAGNOSIS — M79621 Pain in right upper arm: Secondary | ICD-10-CM | POA: Diagnosis not present

## 2020-03-06 DIAGNOSIS — E038 Other specified hypothyroidism: Secondary | ICD-10-CM | POA: Diagnosis not present

## 2020-03-06 DIAGNOSIS — F068 Other specified mental disorders due to known physiological condition: Secondary | ICD-10-CM | POA: Diagnosis not present

## 2020-03-06 DIAGNOSIS — J322 Chronic ethmoidal sinusitis: Secondary | ICD-10-CM | POA: Diagnosis not present

## 2020-03-06 DIAGNOSIS — Z940 Kidney transplant status: Secondary | ICD-10-CM | POA: Diagnosis not present

## 2020-03-06 DIAGNOSIS — F3340 Major depressive disorder, recurrent, in remission, unspecified: Secondary | ICD-10-CM | POA: Diagnosis not present

## 2020-03-06 DIAGNOSIS — I359 Nonrheumatic aortic valve disorder, unspecified: Secondary | ICD-10-CM | POA: Diagnosis not present

## 2020-03-06 DIAGNOSIS — C8330 Diffuse large B-cell lymphoma, unspecified site: Secondary | ICD-10-CM | POA: Diagnosis not present

## 2020-03-06 DIAGNOSIS — Z9989 Dependence on other enabling machines and devices: Secondary | ICD-10-CM | POA: Diagnosis not present

## 2020-03-06 DIAGNOSIS — R202 Paresthesia of skin: Secondary | ICD-10-CM | POA: Diagnosis not present

## 2020-03-06 DIAGNOSIS — T83511A Infection and inflammatory reaction due to indwelling urethral catheter, initial encounter: Secondary | ICD-10-CM | POA: Diagnosis not present

## 2020-03-06 DIAGNOSIS — L820 Inflamed seborrheic keratosis: Secondary | ICD-10-CM | POA: Diagnosis not present

## 2020-03-06 DIAGNOSIS — N816 Rectocele: Secondary | ICD-10-CM | POA: Diagnosis not present

## 2020-03-06 DIAGNOSIS — I517 Cardiomegaly: Secondary | ICD-10-CM | POA: Diagnosis not present

## 2020-03-06 DIAGNOSIS — R197 Diarrhea, unspecified: Secondary | ICD-10-CM | POA: Diagnosis not present

## 2020-03-06 DIAGNOSIS — Z79899 Other long term (current) drug therapy: Secondary | ICD-10-CM | POA: Diagnosis not present

## 2020-03-06 DIAGNOSIS — M810 Age-related osteoporosis without current pathological fracture: Secondary | ICD-10-CM | POA: Diagnosis not present

## 2020-03-06 DIAGNOSIS — R5382 Chronic fatigue, unspecified: Secondary | ICD-10-CM | POA: Diagnosis not present

## 2020-03-06 DIAGNOSIS — M62838 Other muscle spasm: Secondary | ICD-10-CM | POA: Diagnosis not present

## 2020-03-06 DIAGNOSIS — G6181 Chronic inflammatory demyelinating polyneuritis: Secondary | ICD-10-CM | POA: Diagnosis not present

## 2020-03-06 DIAGNOSIS — M722 Plantar fascial fibromatosis: Secondary | ICD-10-CM | POA: Diagnosis not present

## 2020-03-06 DIAGNOSIS — E08621 Diabetes mellitus due to underlying condition with foot ulcer: Secondary | ICD-10-CM | POA: Diagnosis not present

## 2020-03-06 DIAGNOSIS — A419 Sepsis, unspecified organism: Secondary | ICD-10-CM | POA: Diagnosis not present

## 2020-03-06 DIAGNOSIS — Z471 Aftercare following joint replacement surgery: Secondary | ICD-10-CM | POA: Diagnosis not present

## 2020-03-06 DIAGNOSIS — C44519 Basal cell carcinoma of skin of other part of trunk: Secondary | ICD-10-CM | POA: Diagnosis not present

## 2020-03-06 DIAGNOSIS — G959 Disease of spinal cord, unspecified: Secondary | ICD-10-CM | POA: Diagnosis not present

## 2020-03-06 DIAGNOSIS — M25561 Pain in right knee: Secondary | ICD-10-CM | POA: Diagnosis not present

## 2020-03-06 DIAGNOSIS — R0609 Other forms of dyspnea: Secondary | ICD-10-CM | POA: Diagnosis not present

## 2020-03-06 DIAGNOSIS — W109XXD Fall (on) (from) unspecified stairs and steps, subsequent encounter: Secondary | ICD-10-CM | POA: Diagnosis not present

## 2020-03-06 DIAGNOSIS — I6300 Cerebral infarction due to thrombosis of unspecified precerebral artery: Secondary | ICD-10-CM | POA: Diagnosis not present

## 2020-03-06 DIAGNOSIS — Z883 Allergy status to other anti-infective agents status: Secondary | ICD-10-CM | POA: Diagnosis not present

## 2020-03-06 DIAGNOSIS — R5381 Other malaise: Secondary | ICD-10-CM | POA: Diagnosis not present

## 2020-03-06 DIAGNOSIS — H2512 Age-related nuclear cataract, left eye: Secondary | ICD-10-CM | POA: Diagnosis not present

## 2020-03-06 DIAGNOSIS — I110 Hypertensive heart disease with heart failure: Secondary | ICD-10-CM | POA: Diagnosis not present

## 2020-03-06 DIAGNOSIS — G5623 Lesion of ulnar nerve, bilateral upper limbs: Secondary | ICD-10-CM | POA: Diagnosis not present

## 2020-03-06 DIAGNOSIS — C541 Malignant neoplasm of endometrium: Secondary | ICD-10-CM | POA: Diagnosis not present

## 2020-03-06 DIAGNOSIS — D329 Benign neoplasm of meninges, unspecified: Secondary | ICD-10-CM | POA: Diagnosis not present

## 2020-03-06 DIAGNOSIS — L03012 Cellulitis of left finger: Secondary | ICD-10-CM | POA: Diagnosis not present

## 2020-03-06 DIAGNOSIS — H35352 Cystoid macular degeneration, left eye: Secondary | ICD-10-CM | POA: Diagnosis not present

## 2020-03-06 DIAGNOSIS — I82462 Acute embolism and thrombosis of left calf muscular vein: Secondary | ICD-10-CM | POA: Diagnosis not present

## 2020-03-06 DIAGNOSIS — Z905 Acquired absence of kidney: Secondary | ICD-10-CM | POA: Diagnosis not present

## 2020-03-06 DIAGNOSIS — M9905 Segmental and somatic dysfunction of pelvic region: Secondary | ICD-10-CM | POA: Diagnosis not present

## 2020-03-06 DIAGNOSIS — G9341 Metabolic encephalopathy: Secondary | ICD-10-CM | POA: Diagnosis not present

## 2020-03-06 DIAGNOSIS — Z136 Encounter for screening for cardiovascular disorders: Secondary | ICD-10-CM | POA: Diagnosis not present

## 2020-03-06 DIAGNOSIS — R29898 Other symptoms and signs involving the musculoskeletal system: Secondary | ICD-10-CM | POA: Diagnosis not present

## 2020-03-06 DIAGNOSIS — K279 Peptic ulcer, site unspecified, unspecified as acute or chronic, without hemorrhage or perforation: Secondary | ICD-10-CM | POA: Diagnosis not present

## 2020-03-06 DIAGNOSIS — B538 Other malaria, not elsewhere classified: Secondary | ICD-10-CM | POA: Diagnosis not present

## 2020-03-06 DIAGNOSIS — Z6838 Body mass index (BMI) 38.0-38.9, adult: Secondary | ICD-10-CM | POA: Diagnosis not present

## 2020-03-06 DIAGNOSIS — M25541 Pain in joints of right hand: Secondary | ICD-10-CM | POA: Diagnosis not present

## 2020-03-06 DIAGNOSIS — G5711 Meralgia paresthetica, right lower limb: Secondary | ICD-10-CM | POA: Diagnosis not present

## 2020-03-06 DIAGNOSIS — R443 Hallucinations, unspecified: Secondary | ICD-10-CM | POA: Diagnosis not present

## 2020-03-06 DIAGNOSIS — I499 Cardiac arrhythmia, unspecified: Secondary | ICD-10-CM | POA: Diagnosis not present

## 2020-03-06 DIAGNOSIS — R309 Painful micturition, unspecified: Secondary | ICD-10-CM | POA: Diagnosis not present

## 2020-03-06 DIAGNOSIS — R0789 Other chest pain: Secondary | ICD-10-CM | POA: Diagnosis not present

## 2020-03-06 DIAGNOSIS — R194 Change in bowel habit: Secondary | ICD-10-CM | POA: Diagnosis not present

## 2020-03-06 DIAGNOSIS — R0600 Dyspnea, unspecified: Secondary | ICD-10-CM | POA: Diagnosis not present

## 2020-03-06 DIAGNOSIS — M65811 Other synovitis and tenosynovitis, right shoulder: Secondary | ICD-10-CM | POA: Diagnosis not present

## 2020-03-06 DIAGNOSIS — K5000 Crohn's disease of small intestine without complications: Secondary | ICD-10-CM | POA: Diagnosis not present

## 2020-03-06 DIAGNOSIS — Z436 Encounter for attention to other artificial openings of urinary tract: Secondary | ICD-10-CM | POA: Diagnosis not present

## 2020-03-06 DIAGNOSIS — I63431 Cerebral infarction due to embolism of right posterior cerebral artery: Secondary | ICD-10-CM | POA: Diagnosis not present

## 2020-03-06 DIAGNOSIS — I739 Peripheral vascular disease, unspecified: Secondary | ICD-10-CM | POA: Diagnosis not present

## 2020-03-06 DIAGNOSIS — Z96642 Presence of left artificial hip joint: Secondary | ICD-10-CM | POA: Diagnosis not present

## 2020-03-06 DIAGNOSIS — L03311 Cellulitis of abdominal wall: Secondary | ICD-10-CM | POA: Diagnosis not present

## 2020-03-06 DIAGNOSIS — G43009 Migraine without aura, not intractable, without status migrainosus: Secondary | ICD-10-CM | POA: Diagnosis not present

## 2020-03-06 DIAGNOSIS — M5033 Other cervical disc degeneration, cervicothoracic region: Secondary | ICD-10-CM | POA: Diagnosis not present

## 2020-03-06 DIAGNOSIS — E1042 Type 1 diabetes mellitus with diabetic polyneuropathy: Secondary | ICD-10-CM | POA: Diagnosis not present

## 2020-03-06 DIAGNOSIS — R1031 Right lower quadrant pain: Secondary | ICD-10-CM | POA: Diagnosis not present

## 2020-03-06 DIAGNOSIS — Z1152 Encounter for screening for COVID-19: Secondary | ICD-10-CM | POA: Diagnosis not present

## 2020-03-06 DIAGNOSIS — N200 Calculus of kidney: Secondary | ICD-10-CM | POA: Diagnosis not present

## 2020-03-06 DIAGNOSIS — L03116 Cellulitis of left lower limb: Secondary | ICD-10-CM | POA: Diagnosis not present

## 2020-03-06 DIAGNOSIS — M064 Inflammatory polyarthropathy: Secondary | ICD-10-CM | POA: Diagnosis not present

## 2020-03-06 DIAGNOSIS — N611 Abscess of the breast and nipple: Secondary | ICD-10-CM | POA: Diagnosis not present

## 2020-03-06 DIAGNOSIS — R739 Hyperglycemia, unspecified: Secondary | ICD-10-CM | POA: Diagnosis not present

## 2020-03-06 DIAGNOSIS — H02831 Dermatochalasis of right upper eyelid: Secondary | ICD-10-CM | POA: Diagnosis not present

## 2020-03-06 DIAGNOSIS — J4520 Mild intermittent asthma, uncomplicated: Secondary | ICD-10-CM | POA: Diagnosis not present

## 2020-03-06 DIAGNOSIS — H40023 Open angle with borderline findings, high risk, bilateral: Secondary | ICD-10-CM | POA: Diagnosis not present

## 2020-03-06 DIAGNOSIS — J209 Acute bronchitis, unspecified: Secondary | ICD-10-CM | POA: Diagnosis not present

## 2020-03-06 DIAGNOSIS — K259 Gastric ulcer, unspecified as acute or chronic, without hemorrhage or perforation: Secondary | ICD-10-CM | POA: Diagnosis not present

## 2020-03-06 DIAGNOSIS — J9610 Chronic respiratory failure, unspecified whether with hypoxia or hypercapnia: Secondary | ICD-10-CM | POA: Diagnosis not present

## 2020-03-06 DIAGNOSIS — C20 Malignant neoplasm of rectum: Secondary | ICD-10-CM | POA: Diagnosis not present

## 2020-03-06 DIAGNOSIS — S72035D Nondisplaced midcervical fracture of left femur, subsequent encounter for closed fracture with routine healing: Secondary | ICD-10-CM | POA: Diagnosis not present

## 2020-03-06 DIAGNOSIS — Z96643 Presence of artificial hip joint, bilateral: Secondary | ICD-10-CM | POA: Diagnosis not present

## 2020-03-06 DIAGNOSIS — R072 Precordial pain: Secondary | ICD-10-CM | POA: Diagnosis not present

## 2020-03-06 DIAGNOSIS — K589 Irritable bowel syndrome without diarrhea: Secondary | ICD-10-CM | POA: Diagnosis not present

## 2020-03-06 DIAGNOSIS — Z01419 Encounter for gynecological examination (general) (routine) without abnormal findings: Secondary | ICD-10-CM | POA: Diagnosis not present

## 2020-03-06 DIAGNOSIS — H0102A Squamous blepharitis right eye, upper and lower eyelids: Secondary | ICD-10-CM | POA: Diagnosis not present

## 2020-03-06 DIAGNOSIS — G4734 Idiopathic sleep related nonobstructive alveolar hypoventilation: Secondary | ICD-10-CM | POA: Diagnosis not present

## 2020-03-06 DIAGNOSIS — L4059 Other psoriatic arthropathy: Secondary | ICD-10-CM | POA: Diagnosis not present

## 2020-03-06 DIAGNOSIS — Z953 Presence of xenogenic heart valve: Secondary | ICD-10-CM | POA: Diagnosis not present

## 2020-03-06 DIAGNOSIS — Z821 Family history of blindness and visual loss: Secondary | ICD-10-CM | POA: Diagnosis not present

## 2020-03-06 DIAGNOSIS — R000 Tachycardia, unspecified: Secondary | ICD-10-CM | POA: Diagnosis not present

## 2020-03-06 DIAGNOSIS — R230 Cyanosis: Secondary | ICD-10-CM | POA: Diagnosis not present

## 2020-03-06 DIAGNOSIS — C229 Malignant neoplasm of liver, not specified as primary or secondary: Secondary | ICD-10-CM | POA: Diagnosis not present

## 2020-03-06 DIAGNOSIS — G629 Polyneuropathy, unspecified: Secondary | ICD-10-CM | POA: Diagnosis not present

## 2020-03-06 DIAGNOSIS — N649 Disorder of breast, unspecified: Secondary | ICD-10-CM | POA: Diagnosis not present

## 2020-03-06 DIAGNOSIS — D0472 Carcinoma in situ of skin of left lower limb, including hip: Secondary | ICD-10-CM | POA: Diagnosis not present

## 2020-03-06 DIAGNOSIS — Z9049 Acquired absence of other specified parts of digestive tract: Secondary | ICD-10-CM | POA: Diagnosis not present

## 2020-03-06 DIAGNOSIS — M2041 Other hammer toe(s) (acquired), right foot: Secondary | ICD-10-CM | POA: Diagnosis not present

## 2020-03-06 DIAGNOSIS — R748 Abnormal levels of other serum enzymes: Secondary | ICD-10-CM | POA: Diagnosis not present

## 2020-03-06 DIAGNOSIS — M76821 Posterior tibial tendinitis, right leg: Secondary | ICD-10-CM | POA: Diagnosis not present

## 2020-03-06 DIAGNOSIS — I255 Ischemic cardiomyopathy: Secondary | ICD-10-CM | POA: Diagnosis not present

## 2020-03-06 DIAGNOSIS — D0471 Carcinoma in situ of skin of right lower limb, including hip: Secondary | ICD-10-CM | POA: Diagnosis not present

## 2020-03-06 DIAGNOSIS — I509 Heart failure, unspecified: Secondary | ICD-10-CM | POA: Diagnosis not present

## 2020-03-06 DIAGNOSIS — N400 Enlarged prostate without lower urinary tract symptoms: Secondary | ICD-10-CM | POA: Diagnosis not present

## 2020-03-06 DIAGNOSIS — Z87442 Personal history of urinary calculi: Secondary | ICD-10-CM | POA: Diagnosis not present

## 2020-03-06 DIAGNOSIS — N393 Stress incontinence (female) (male): Secondary | ICD-10-CM | POA: Diagnosis not present

## 2020-03-06 DIAGNOSIS — M25672 Stiffness of left ankle, not elsewhere classified: Secondary | ICD-10-CM | POA: Diagnosis not present

## 2020-03-06 DIAGNOSIS — M25551 Pain in right hip: Secondary | ICD-10-CM | POA: Diagnosis not present

## 2020-03-06 DIAGNOSIS — M47812 Spondylosis without myelopathy or radiculopathy, cervical region: Secondary | ICD-10-CM | POA: Diagnosis not present

## 2020-03-06 DIAGNOSIS — J343 Hypertrophy of nasal turbinates: Secondary | ICD-10-CM | POA: Diagnosis not present

## 2020-03-06 DIAGNOSIS — R7309 Other abnormal glucose: Secondary | ICD-10-CM | POA: Diagnosis not present

## 2020-03-06 DIAGNOSIS — S30860A Insect bite (nonvenomous) of lower back and pelvis, initial encounter: Secondary | ICD-10-CM | POA: Diagnosis not present

## 2020-03-06 DIAGNOSIS — Z6833 Body mass index (BMI) 33.0-33.9, adult: Secondary | ICD-10-CM | POA: Diagnosis not present

## 2020-03-06 DIAGNOSIS — Z48816 Encounter for surgical aftercare following surgery on the genitourinary system: Secondary | ICD-10-CM | POA: Diagnosis not present

## 2020-03-06 DIAGNOSIS — M5442 Lumbago with sciatica, left side: Secondary | ICD-10-CM | POA: Diagnosis not present

## 2020-03-06 DIAGNOSIS — J340 Abscess, furuncle and carbuncle of nose: Secondary | ICD-10-CM | POA: Diagnosis not present

## 2020-03-06 DIAGNOSIS — M19041 Primary osteoarthritis, right hand: Secondary | ICD-10-CM | POA: Diagnosis not present

## 2020-03-06 DIAGNOSIS — M5136 Other intervertebral disc degeneration, lumbar region: Secondary | ICD-10-CM | POA: Diagnosis not present

## 2020-03-06 DIAGNOSIS — G9009 Other idiopathic peripheral autonomic neuropathy: Secondary | ICD-10-CM | POA: Diagnosis not present

## 2020-03-06 DIAGNOSIS — F0280 Dementia in other diseases classified elsewhere without behavioral disturbance: Secondary | ICD-10-CM | POA: Diagnosis not present

## 2020-03-06 DIAGNOSIS — E119 Type 2 diabetes mellitus without complications: Secondary | ICD-10-CM | POA: Diagnosis not present

## 2020-03-06 DIAGNOSIS — Z91018 Allergy to other foods: Secondary | ICD-10-CM | POA: Diagnosis not present

## 2020-03-06 DIAGNOSIS — Z9981 Dependence on supplemental oxygen: Secondary | ICD-10-CM | POA: Diagnosis not present

## 2020-03-06 DIAGNOSIS — F4312 Post-traumatic stress disorder, chronic: Secondary | ICD-10-CM | POA: Diagnosis not present

## 2020-03-06 DIAGNOSIS — N958 Other specified menopausal and perimenopausal disorders: Secondary | ICD-10-CM | POA: Diagnosis not present

## 2020-03-06 DIAGNOSIS — M79674 Pain in right toe(s): Secondary | ICD-10-CM | POA: Diagnosis not present

## 2020-03-06 DIAGNOSIS — K644 Residual hemorrhoidal skin tags: Secondary | ICD-10-CM | POA: Diagnosis not present

## 2020-03-06 DIAGNOSIS — R928 Other abnormal and inconclusive findings on diagnostic imaging of breast: Secondary | ICD-10-CM | POA: Diagnosis not present

## 2020-03-06 DIAGNOSIS — Z6822 Body mass index (BMI) 22.0-22.9, adult: Secondary | ICD-10-CM | POA: Diagnosis not present

## 2020-03-06 DIAGNOSIS — D225 Melanocytic nevi of trunk: Secondary | ICD-10-CM | POA: Diagnosis not present

## 2020-03-06 DIAGNOSIS — R7303 Prediabetes: Secondary | ICD-10-CM | POA: Diagnosis not present

## 2020-03-06 DIAGNOSIS — S0083XA Contusion of other part of head, initial encounter: Secondary | ICD-10-CM | POA: Diagnosis not present

## 2020-03-06 DIAGNOSIS — E538 Deficiency of other specified B group vitamins: Secondary | ICD-10-CM | POA: Diagnosis not present

## 2020-03-06 DIAGNOSIS — M7542 Impingement syndrome of left shoulder: Secondary | ICD-10-CM | POA: Diagnosis not present

## 2020-03-06 DIAGNOSIS — T148XXA Other injury of unspecified body region, initial encounter: Secondary | ICD-10-CM | POA: Diagnosis not present

## 2020-03-06 DIAGNOSIS — R233 Spontaneous ecchymoses: Secondary | ICD-10-CM | POA: Diagnosis not present

## 2020-03-06 DIAGNOSIS — G3101 Pick's disease: Secondary | ICD-10-CM | POA: Diagnosis not present

## 2020-03-06 DIAGNOSIS — J1282 Pneumonia due to coronavirus disease 2019: Secondary | ICD-10-CM | POA: Diagnosis not present

## 2020-03-06 DIAGNOSIS — F064 Anxiety disorder due to known physiological condition: Secondary | ICD-10-CM | POA: Diagnosis not present

## 2020-03-06 DIAGNOSIS — M533 Sacrococcygeal disorders, not elsewhere classified: Secondary | ICD-10-CM | POA: Diagnosis not present

## 2020-03-06 DIAGNOSIS — M7551 Bursitis of right shoulder: Secondary | ICD-10-CM | POA: Diagnosis not present

## 2020-03-06 DIAGNOSIS — B20 Human immunodeficiency virus [HIV] disease: Secondary | ICD-10-CM | POA: Diagnosis not present

## 2020-03-06 DIAGNOSIS — Z89511 Acquired absence of right leg below knee: Secondary | ICD-10-CM | POA: Diagnosis not present

## 2020-03-06 DIAGNOSIS — S62316A Displaced fracture of base of fifth metacarpal bone, right hand, initial encounter for closed fracture: Secondary | ICD-10-CM | POA: Diagnosis not present

## 2020-03-06 DIAGNOSIS — E0842 Diabetes mellitus due to underlying condition with diabetic polyneuropathy: Secondary | ICD-10-CM | POA: Diagnosis not present

## 2020-03-06 DIAGNOSIS — J9811 Atelectasis: Secondary | ICD-10-CM | POA: Diagnosis not present

## 2020-03-06 DIAGNOSIS — R635 Abnormal weight gain: Secondary | ICD-10-CM | POA: Diagnosis not present

## 2020-03-06 DIAGNOSIS — S4992XA Unspecified injury of left shoulder and upper arm, initial encounter: Secondary | ICD-10-CM | POA: Diagnosis not present

## 2020-03-06 DIAGNOSIS — R39198 Other difficulties with micturition: Secondary | ICD-10-CM | POA: Diagnosis not present

## 2020-03-06 DIAGNOSIS — R259 Unspecified abnormal involuntary movements: Secondary | ICD-10-CM | POA: Diagnosis not present

## 2020-03-06 DIAGNOSIS — S40022A Contusion of left upper arm, initial encounter: Secondary | ICD-10-CM | POA: Diagnosis not present

## 2020-03-06 DIAGNOSIS — I442 Atrioventricular block, complete: Secondary | ICD-10-CM | POA: Diagnosis not present

## 2020-03-06 DIAGNOSIS — E291 Testicular hypofunction: Secondary | ICD-10-CM | POA: Diagnosis not present

## 2020-03-06 DIAGNOSIS — K621 Rectal polyp: Secondary | ICD-10-CM | POA: Diagnosis not present

## 2020-03-06 DIAGNOSIS — G243 Spasmodic torticollis: Secondary | ICD-10-CM | POA: Diagnosis not present

## 2020-03-06 DIAGNOSIS — M25661 Stiffness of right knee, not elsewhere classified: Secondary | ICD-10-CM | POA: Diagnosis not present

## 2020-03-06 DIAGNOSIS — K2270 Barrett's esophagus without dysplasia: Secondary | ICD-10-CM | POA: Diagnosis not present

## 2020-03-06 DIAGNOSIS — D2239 Melanocytic nevi of other parts of face: Secondary | ICD-10-CM | POA: Diagnosis not present

## 2020-03-06 DIAGNOSIS — H524 Presbyopia: Secondary | ICD-10-CM | POA: Diagnosis not present

## 2020-03-06 DIAGNOSIS — E7800 Pure hypercholesterolemia, unspecified: Secondary | ICD-10-CM | POA: Diagnosis not present

## 2020-03-06 DIAGNOSIS — D124 Benign neoplasm of descending colon: Secondary | ICD-10-CM | POA: Diagnosis not present

## 2020-03-06 DIAGNOSIS — R413 Other amnesia: Secondary | ICD-10-CM | POA: Diagnosis not present

## 2020-03-06 DIAGNOSIS — I119 Hypertensive heart disease without heart failure: Secondary | ICD-10-CM | POA: Diagnosis not present

## 2020-03-06 DIAGNOSIS — D630 Anemia in neoplastic disease: Secondary | ICD-10-CM | POA: Diagnosis not present

## 2020-03-06 DIAGNOSIS — M3500 Sicca syndrome, unspecified: Secondary | ICD-10-CM | POA: Diagnosis not present

## 2020-03-06 DIAGNOSIS — C158 Malignant neoplasm of overlapping sites of esophagus: Secondary | ICD-10-CM | POA: Diagnosis not present

## 2020-03-06 DIAGNOSIS — J42 Unspecified chronic bronchitis: Secondary | ICD-10-CM | POA: Diagnosis not present

## 2020-03-06 DIAGNOSIS — M2241 Chondromalacia patellae, right knee: Secondary | ICD-10-CM | POA: Diagnosis not present

## 2020-03-06 DIAGNOSIS — Z6829 Body mass index (BMI) 29.0-29.9, adult: Secondary | ICD-10-CM | POA: Diagnosis not present

## 2020-03-06 DIAGNOSIS — K766 Portal hypertension: Secondary | ICD-10-CM | POA: Diagnosis not present

## 2020-03-06 DIAGNOSIS — H401113 Primary open-angle glaucoma, right eye, severe stage: Secondary | ICD-10-CM | POA: Diagnosis not present

## 2020-03-06 DIAGNOSIS — I70213 Atherosclerosis of native arteries of extremities with intermittent claudication, bilateral legs: Secondary | ICD-10-CM | POA: Diagnosis not present

## 2020-03-06 DIAGNOSIS — B1920 Unspecified viral hepatitis C without hepatic coma: Secondary | ICD-10-CM | POA: Diagnosis not present

## 2020-03-06 DIAGNOSIS — S46011A Strain of muscle(s) and tendon(s) of the rotator cuff of right shoulder, initial encounter: Secondary | ICD-10-CM | POA: Diagnosis not present

## 2020-03-06 DIAGNOSIS — M069 Rheumatoid arthritis, unspecified: Secondary | ICD-10-CM | POA: Diagnosis not present

## 2020-03-06 DIAGNOSIS — M19042 Primary osteoarthritis, left hand: Secondary | ICD-10-CM | POA: Diagnosis not present

## 2020-03-06 DIAGNOSIS — N139 Obstructive and reflux uropathy, unspecified: Secondary | ICD-10-CM | POA: Diagnosis not present

## 2020-03-06 DIAGNOSIS — L97521 Non-pressure chronic ulcer of other part of left foot limited to breakdown of skin: Secondary | ICD-10-CM | POA: Diagnosis not present

## 2020-03-06 DIAGNOSIS — I421 Obstructive hypertrophic cardiomyopathy: Secondary | ICD-10-CM | POA: Diagnosis not present

## 2020-03-06 DIAGNOSIS — H61303 Acquired stenosis of external ear canal, unspecified, bilateral: Secondary | ICD-10-CM | POA: Diagnosis not present

## 2020-03-06 DIAGNOSIS — G5621 Lesion of ulnar nerve, right upper limb: Secondary | ICD-10-CM | POA: Diagnosis not present

## 2020-03-06 DIAGNOSIS — N814 Uterovaginal prolapse, unspecified: Secondary | ICD-10-CM | POA: Diagnosis not present

## 2020-03-06 DIAGNOSIS — M25629 Stiffness of unspecified elbow, not elsewhere classified: Secondary | ICD-10-CM | POA: Diagnosis not present

## 2020-03-06 DIAGNOSIS — Z515 Encounter for palliative care: Secondary | ICD-10-CM | POA: Diagnosis not present

## 2020-03-06 DIAGNOSIS — J302 Other seasonal allergic rhinitis: Secondary | ICD-10-CM | POA: Diagnosis not present

## 2020-03-06 DIAGNOSIS — J309 Allergic rhinitis, unspecified: Secondary | ICD-10-CM | POA: Diagnosis not present

## 2020-03-06 DIAGNOSIS — G4489 Other headache syndrome: Secondary | ICD-10-CM | POA: Diagnosis not present

## 2020-03-06 DIAGNOSIS — A4101 Sepsis due to Methicillin susceptible Staphylococcus aureus: Secondary | ICD-10-CM | POA: Diagnosis not present

## 2020-03-06 DIAGNOSIS — S299XXA Unspecified injury of thorax, initial encounter: Secondary | ICD-10-CM | POA: Diagnosis not present

## 2020-03-06 DIAGNOSIS — R232 Flushing: Secondary | ICD-10-CM | POA: Diagnosis not present

## 2020-03-06 DIAGNOSIS — R220 Localized swelling, mass and lump, head: Secondary | ICD-10-CM | POA: Diagnosis not present

## 2020-03-06 DIAGNOSIS — H353131 Nonexudative age-related macular degeneration, bilateral, early dry stage: Secondary | ICD-10-CM | POA: Diagnosis not present

## 2020-03-06 DIAGNOSIS — M659 Synovitis and tenosynovitis, unspecified: Secondary | ICD-10-CM | POA: Diagnosis not present

## 2020-03-06 DIAGNOSIS — I2699 Other pulmonary embolism without acute cor pulmonale: Secondary | ICD-10-CM | POA: Diagnosis not present

## 2020-03-06 DIAGNOSIS — H353130 Nonexudative age-related macular degeneration, bilateral, stage unspecified: Secondary | ICD-10-CM | POA: Diagnosis not present

## 2020-03-06 DIAGNOSIS — F3342 Major depressive disorder, recurrent, in full remission: Secondary | ICD-10-CM | POA: Diagnosis not present

## 2020-03-06 DIAGNOSIS — M62561 Muscle wasting and atrophy, not elsewhere classified, right lower leg: Secondary | ICD-10-CM | POA: Diagnosis not present

## 2020-03-06 DIAGNOSIS — H25811 Combined forms of age-related cataract, right eye: Secondary | ICD-10-CM | POA: Diagnosis not present

## 2020-03-06 DIAGNOSIS — I63012 Cerebral infarction due to thrombosis of left vertebral artery: Secondary | ICD-10-CM | POA: Diagnosis not present

## 2020-03-06 DIAGNOSIS — M25622 Stiffness of left elbow, not elsewhere classified: Secondary | ICD-10-CM | POA: Diagnosis not present

## 2020-03-06 DIAGNOSIS — Z6828 Body mass index (BMI) 28.0-28.9, adult: Secondary | ICD-10-CM | POA: Diagnosis not present

## 2020-03-06 DIAGNOSIS — G2581 Restless legs syndrome: Secondary | ICD-10-CM | POA: Diagnosis not present

## 2020-03-06 DIAGNOSIS — H35373 Puckering of macula, bilateral: Secondary | ICD-10-CM | POA: Diagnosis not present

## 2020-03-06 DIAGNOSIS — Z170 Estrogen receptor positive status [ER+]: Secondary | ICD-10-CM | POA: Diagnosis not present

## 2020-03-06 DIAGNOSIS — M7121 Synovial cyst of popliteal space [Baker], right knee: Secondary | ICD-10-CM | POA: Diagnosis not present

## 2020-03-06 DIAGNOSIS — Z1239 Encounter for other screening for malignant neoplasm of breast: Secondary | ICD-10-CM | POA: Diagnosis not present

## 2020-03-06 DIAGNOSIS — S32592G Other specified fracture of left pubis, subsequent encounter for fracture with delayed healing: Secondary | ICD-10-CM | POA: Diagnosis not present

## 2020-03-06 DIAGNOSIS — F1190 Opioid use, unspecified, uncomplicated: Secondary | ICD-10-CM | POA: Diagnosis not present

## 2020-03-06 DIAGNOSIS — S20211A Contusion of right front wall of thorax, initial encounter: Secondary | ICD-10-CM | POA: Diagnosis not present

## 2020-03-06 DIAGNOSIS — Z853 Personal history of malignant neoplasm of breast: Secondary | ICD-10-CM | POA: Diagnosis not present

## 2020-03-06 DIAGNOSIS — Z950 Presence of cardiac pacemaker: Secondary | ICD-10-CM | POA: Diagnosis not present

## 2020-03-06 DIAGNOSIS — E1129 Type 2 diabetes mellitus with other diabetic kidney complication: Secondary | ICD-10-CM | POA: Diagnosis not present

## 2020-03-06 DIAGNOSIS — H00022 Hordeolum internum right lower eyelid: Secondary | ICD-10-CM | POA: Diagnosis not present

## 2020-03-06 DIAGNOSIS — I051 Rheumatic mitral insufficiency: Secondary | ICD-10-CM | POA: Diagnosis not present

## 2020-03-06 DIAGNOSIS — Z833 Family history of diabetes mellitus: Secondary | ICD-10-CM | POA: Diagnosis not present

## 2020-03-06 DIAGNOSIS — E1151 Type 2 diabetes mellitus with diabetic peripheral angiopathy without gangrene: Secondary | ICD-10-CM | POA: Diagnosis not present

## 2020-03-06 DIAGNOSIS — Z9911 Dependence on respirator [ventilator] status: Secondary | ICD-10-CM | POA: Diagnosis not present

## 2020-03-06 DIAGNOSIS — F330 Major depressive disorder, recurrent, mild: Secondary | ICD-10-CM | POA: Diagnosis not present

## 2020-03-06 DIAGNOSIS — M21619 Bunion of unspecified foot: Secondary | ICD-10-CM | POA: Diagnosis not present

## 2020-03-06 DIAGNOSIS — Z4682 Encounter for fitting and adjustment of non-vascular catheter: Secondary | ICD-10-CM | POA: Diagnosis not present

## 2020-03-06 DIAGNOSIS — M160 Bilateral primary osteoarthritis of hip: Secondary | ICD-10-CM | POA: Diagnosis not present

## 2020-03-06 DIAGNOSIS — R9389 Abnormal findings on diagnostic imaging of other specified body structures: Secondary | ICD-10-CM | POA: Diagnosis not present

## 2020-03-06 DIAGNOSIS — S0990XA Unspecified injury of head, initial encounter: Secondary | ICD-10-CM | POA: Diagnosis not present

## 2020-03-06 DIAGNOSIS — C3412 Malignant neoplasm of upper lobe, left bronchus or lung: Secondary | ICD-10-CM | POA: Diagnosis not present

## 2020-03-06 DIAGNOSIS — D400 Neoplasm of uncertain behavior of prostate: Secondary | ICD-10-CM | POA: Diagnosis not present

## 2020-03-06 DIAGNOSIS — E113313 Type 2 diabetes mellitus with moderate nonproliferative diabetic retinopathy with macular edema, bilateral: Secondary | ICD-10-CM | POA: Diagnosis not present

## 2020-03-06 DIAGNOSIS — Z882 Allergy status to sulfonamides status: Secondary | ICD-10-CM | POA: Diagnosis not present

## 2020-03-06 DIAGNOSIS — I82811 Embolism and thrombosis of superficial veins of right lower extremities: Secondary | ICD-10-CM | POA: Diagnosis not present

## 2020-03-06 DIAGNOSIS — H409 Unspecified glaucoma: Secondary | ICD-10-CM | POA: Diagnosis not present

## 2020-03-06 DIAGNOSIS — C8510 Unspecified B-cell lymphoma, unspecified site: Secondary | ICD-10-CM | POA: Diagnosis not present

## 2020-03-06 DIAGNOSIS — M5116 Intervertebral disc disorders with radiculopathy, lumbar region: Secondary | ICD-10-CM | POA: Diagnosis not present

## 2020-03-06 DIAGNOSIS — H35361 Drusen (degenerative) of macula, right eye: Secondary | ICD-10-CM | POA: Diagnosis not present

## 2020-03-06 DIAGNOSIS — Z20828 Contact with and (suspected) exposure to other viral communicable diseases: Secondary | ICD-10-CM | POA: Diagnosis not present

## 2020-03-06 DIAGNOSIS — H353211 Exudative age-related macular degeneration, right eye, with active choroidal neovascularization: Secondary | ICD-10-CM | POA: Diagnosis not present

## 2020-03-06 DIAGNOSIS — S6992XA Unspecified injury of left wrist, hand and finger(s), initial encounter: Secondary | ICD-10-CM | POA: Diagnosis not present

## 2020-03-06 DIAGNOSIS — R262 Difficulty in walking, not elsewhere classified: Secondary | ICD-10-CM | POA: Diagnosis not present

## 2020-03-06 DIAGNOSIS — R3129 Other microscopic hematuria: Secondary | ICD-10-CM | POA: Diagnosis not present

## 2020-03-06 DIAGNOSIS — I252 Old myocardial infarction: Secondary | ICD-10-CM | POA: Diagnosis not present

## 2020-03-06 DIAGNOSIS — M50323 Other cervical disc degeneration at C6-C7 level: Secondary | ICD-10-CM | POA: Diagnosis not present

## 2020-03-06 DIAGNOSIS — M791 Myalgia, unspecified site: Secondary | ICD-10-CM | POA: Diagnosis not present

## 2020-03-06 DIAGNOSIS — L210 Seborrhea capitis: Secondary | ICD-10-CM | POA: Diagnosis not present

## 2020-03-06 DIAGNOSIS — Z4789 Encounter for other orthopedic aftercare: Secondary | ICD-10-CM | POA: Diagnosis not present

## 2020-03-06 DIAGNOSIS — Z1339 Encounter for screening examination for other mental health and behavioral disorders: Secondary | ICD-10-CM | POA: Diagnosis not present

## 2020-03-06 DIAGNOSIS — M47892 Other spondylosis, cervical region: Secondary | ICD-10-CM | POA: Diagnosis not present

## 2020-03-06 DIAGNOSIS — E1165 Type 2 diabetes mellitus with hyperglycemia: Secondary | ICD-10-CM | POA: Diagnosis not present

## 2020-03-06 DIAGNOSIS — G4730 Sleep apnea, unspecified: Secondary | ICD-10-CM | POA: Diagnosis not present

## 2020-03-06 DIAGNOSIS — D485 Neoplasm of uncertain behavior of skin: Secondary | ICD-10-CM | POA: Diagnosis not present

## 2020-03-06 DIAGNOSIS — F411 Generalized anxiety disorder: Secondary | ICD-10-CM | POA: Diagnosis not present

## 2020-03-06 DIAGNOSIS — M1612 Unilateral primary osteoarthritis, left hip: Secondary | ICD-10-CM | POA: Diagnosis not present

## 2020-03-06 DIAGNOSIS — L814 Other melanin hyperpigmentation: Secondary | ICD-10-CM | POA: Diagnosis not present

## 2020-03-06 DIAGNOSIS — H401131 Primary open-angle glaucoma, bilateral, mild stage: Secondary | ICD-10-CM | POA: Diagnosis not present

## 2020-03-06 DIAGNOSIS — K4090 Unilateral inguinal hernia, without obstruction or gangrene, not specified as recurrent: Secondary | ICD-10-CM | POA: Diagnosis not present

## 2020-03-06 DIAGNOSIS — L259 Unspecified contact dermatitis, unspecified cause: Secondary | ICD-10-CM | POA: Diagnosis not present

## 2020-03-06 DIAGNOSIS — N651 Disproportion of reconstructed breast: Secondary | ICD-10-CM | POA: Diagnosis not present

## 2020-03-06 DIAGNOSIS — N950 Postmenopausal bleeding: Secondary | ICD-10-CM | POA: Diagnosis not present

## 2020-03-06 DIAGNOSIS — C770 Secondary and unspecified malignant neoplasm of lymph nodes of head, face and neck: Secondary | ICD-10-CM | POA: Diagnosis not present

## 2020-03-06 DIAGNOSIS — M792 Neuralgia and neuritis, unspecified: Secondary | ICD-10-CM | POA: Diagnosis not present

## 2020-03-06 DIAGNOSIS — I471 Supraventricular tachycardia: Secondary | ICD-10-CM | POA: Diagnosis not present

## 2020-03-06 DIAGNOSIS — Z1389 Encounter for screening for other disorder: Secondary | ICD-10-CM | POA: Diagnosis not present

## 2020-03-06 DIAGNOSIS — R4586 Emotional lability: Secondary | ICD-10-CM | POA: Diagnosis not present

## 2020-03-06 DIAGNOSIS — C4441 Basal cell carcinoma of skin of scalp and neck: Secondary | ICD-10-CM | POA: Diagnosis not present

## 2020-03-06 DIAGNOSIS — F329 Major depressive disorder, single episode, unspecified: Secondary | ICD-10-CM | POA: Diagnosis not present

## 2020-03-06 DIAGNOSIS — R55 Syncope and collapse: Secondary | ICD-10-CM | POA: Diagnosis not present

## 2020-03-06 DIAGNOSIS — I6782 Cerebral ischemia: Secondary | ICD-10-CM | POA: Diagnosis not present

## 2020-03-06 DIAGNOSIS — M4716 Other spondylosis with myelopathy, lumbar region: Secondary | ICD-10-CM | POA: Diagnosis not present

## 2020-03-06 DIAGNOSIS — M549 Dorsalgia, unspecified: Secondary | ICD-10-CM | POA: Diagnosis not present

## 2020-03-06 DIAGNOSIS — G3183 Dementia with Lewy bodies: Secondary | ICD-10-CM | POA: Diagnosis not present

## 2020-03-06 DIAGNOSIS — L812 Freckles: Secondary | ICD-10-CM | POA: Diagnosis not present

## 2020-03-06 DIAGNOSIS — R079 Chest pain, unspecified: Secondary | ICD-10-CM | POA: Diagnosis not present

## 2020-03-06 DIAGNOSIS — H539 Unspecified visual disturbance: Secondary | ICD-10-CM | POA: Diagnosis not present

## 2020-03-06 DIAGNOSIS — Z803 Family history of malignant neoplasm of breast: Secondary | ICD-10-CM | POA: Diagnosis not present

## 2020-03-06 DIAGNOSIS — I493 Ventricular premature depolarization: Secondary | ICD-10-CM | POA: Diagnosis not present

## 2020-03-06 DIAGNOSIS — D7389 Other diseases of spleen: Secondary | ICD-10-CM | POA: Diagnosis not present

## 2020-03-06 DIAGNOSIS — K7460 Unspecified cirrhosis of liver: Secondary | ICD-10-CM | POA: Diagnosis not present

## 2020-03-06 DIAGNOSIS — J0100 Acute maxillary sinusitis, unspecified: Secondary | ICD-10-CM | POA: Diagnosis not present

## 2020-03-06 DIAGNOSIS — M316 Other giant cell arteritis: Secondary | ICD-10-CM | POA: Diagnosis not present

## 2020-03-06 DIAGNOSIS — I998 Other disorder of circulatory system: Secondary | ICD-10-CM | POA: Diagnosis not present

## 2020-03-06 DIAGNOSIS — Z86018 Personal history of other benign neoplasm: Secondary | ICD-10-CM | POA: Diagnosis not present

## 2020-03-06 DIAGNOSIS — H25013 Cortical age-related cataract, bilateral: Secondary | ICD-10-CM | POA: Diagnosis not present

## 2020-03-06 DIAGNOSIS — I5031 Acute diastolic (congestive) heart failure: Secondary | ICD-10-CM | POA: Diagnosis not present

## 2020-03-06 DIAGNOSIS — H7202 Central perforation of tympanic membrane, left ear: Secondary | ICD-10-CM | POA: Diagnosis not present

## 2020-03-06 DIAGNOSIS — I7300 Raynaud's syndrome without gangrene: Secondary | ICD-10-CM | POA: Diagnosis not present

## 2020-03-06 DIAGNOSIS — F430 Acute stress reaction: Secondary | ICD-10-CM | POA: Diagnosis not present

## 2020-03-06 DIAGNOSIS — T8484XA Pain due to internal orthopedic prosthetic devices, implants and grafts, initial encounter: Secondary | ICD-10-CM | POA: Diagnosis not present

## 2020-03-06 DIAGNOSIS — Z8719 Personal history of other diseases of the digestive system: Secondary | ICD-10-CM | POA: Diagnosis not present

## 2020-03-06 DIAGNOSIS — X58XXXA Exposure to other specified factors, initial encounter: Secondary | ICD-10-CM | POA: Diagnosis not present

## 2020-03-06 DIAGNOSIS — M80032D Age-related osteoporosis with current pathological fracture, left forearm, subsequent encounter for fracture with routine healing: Secondary | ICD-10-CM | POA: Diagnosis not present

## 2020-03-06 DIAGNOSIS — G459 Transient cerebral ischemic attack, unspecified: Secondary | ICD-10-CM | POA: Diagnosis not present

## 2020-03-06 DIAGNOSIS — D1801 Hemangioma of skin and subcutaneous tissue: Secondary | ICD-10-CM | POA: Diagnosis not present

## 2020-03-06 DIAGNOSIS — N1832 Chronic kidney disease, stage 3b: Secondary | ICD-10-CM | POA: Diagnosis not present

## 2020-03-06 DIAGNOSIS — N3592 Unspecified urethral stricture, female: Secondary | ICD-10-CM | POA: Diagnosis not present

## 2020-03-06 DIAGNOSIS — D649 Anemia, unspecified: Secondary | ICD-10-CM | POA: Diagnosis not present

## 2020-03-06 DIAGNOSIS — R0982 Postnasal drip: Secondary | ICD-10-CM | POA: Diagnosis not present

## 2020-03-06 DIAGNOSIS — Z79890 Hormone replacement therapy (postmenopausal): Secondary | ICD-10-CM | POA: Diagnosis not present

## 2020-03-06 DIAGNOSIS — E782 Mixed hyperlipidemia: Secondary | ICD-10-CM | POA: Diagnosis not present

## 2020-03-06 DIAGNOSIS — N132 Hydronephrosis with renal and ureteral calculous obstruction: Secondary | ICD-10-CM | POA: Diagnosis not present

## 2020-03-06 DIAGNOSIS — E2740 Unspecified adrenocortical insufficiency: Secondary | ICD-10-CM | POA: Diagnosis not present

## 2020-03-06 DIAGNOSIS — R278 Other lack of coordination: Secondary | ICD-10-CM | POA: Diagnosis not present

## 2020-03-06 DIAGNOSIS — E668 Other obesity: Secondary | ICD-10-CM | POA: Diagnosis not present

## 2020-03-06 DIAGNOSIS — N5201 Erectile dysfunction due to arterial insufficiency: Secondary | ICD-10-CM | POA: Diagnosis not present

## 2020-03-06 DIAGNOSIS — F419 Anxiety disorder, unspecified: Secondary | ICD-10-CM | POA: Diagnosis not present

## 2020-03-06 DIAGNOSIS — M75122 Complete rotator cuff tear or rupture of left shoulder, not specified as traumatic: Secondary | ICD-10-CM | POA: Diagnosis not present

## 2020-03-06 DIAGNOSIS — H43393 Other vitreous opacities, bilateral: Secondary | ICD-10-CM | POA: Diagnosis not present

## 2020-03-06 DIAGNOSIS — M25642 Stiffness of left hand, not elsewhere classified: Secondary | ICD-10-CM | POA: Diagnosis not present

## 2020-03-06 DIAGNOSIS — R3915 Urgency of urination: Secondary | ICD-10-CM | POA: Diagnosis not present

## 2020-03-06 DIAGNOSIS — Z789 Other specified health status: Secondary | ICD-10-CM | POA: Diagnosis not present

## 2020-03-06 DIAGNOSIS — G250 Essential tremor: Secondary | ICD-10-CM | POA: Diagnosis not present

## 2020-03-06 DIAGNOSIS — C7931 Secondary malignant neoplasm of brain: Secondary | ICD-10-CM | POA: Diagnosis not present

## 2020-03-06 DIAGNOSIS — W57XXXA Bitten or stung by nonvenomous insect and other nonvenomous arthropods, initial encounter: Secondary | ICD-10-CM | POA: Diagnosis not present

## 2020-03-06 DIAGNOSIS — H2513 Age-related nuclear cataract, bilateral: Secondary | ICD-10-CM | POA: Diagnosis not present

## 2020-03-06 DIAGNOSIS — H019 Unspecified inflammation of eyelid: Secondary | ICD-10-CM | POA: Diagnosis not present

## 2020-03-06 DIAGNOSIS — M5432 Sciatica, left side: Secondary | ICD-10-CM | POA: Diagnosis not present

## 2020-03-06 DIAGNOSIS — R946 Abnormal results of thyroid function studies: Secondary | ICD-10-CM | POA: Diagnosis not present

## 2020-03-06 DIAGNOSIS — K8020 Calculus of gallbladder without cholecystitis without obstruction: Secondary | ICD-10-CM | POA: Diagnosis not present

## 2020-03-06 DIAGNOSIS — K625 Hemorrhage of anus and rectum: Secondary | ICD-10-CM | POA: Diagnosis not present

## 2020-03-06 DIAGNOSIS — H0288A Meibomian gland dysfunction right eye, upper and lower eyelids: Secondary | ICD-10-CM | POA: Diagnosis not present

## 2020-03-06 DIAGNOSIS — G40119 Localization-related (focal) (partial) symptomatic epilepsy and epileptic syndromes with simple partial seizures, intractable, without status epilepticus: Secondary | ICD-10-CM | POA: Diagnosis not present

## 2020-03-06 DIAGNOSIS — H5319 Other subjective visual disturbances: Secondary | ICD-10-CM | POA: Diagnosis not present

## 2020-03-06 DIAGNOSIS — S92424A Nondisplaced fracture of distal phalanx of right great toe, initial encounter for closed fracture: Secondary | ICD-10-CM | POA: Diagnosis not present

## 2020-03-06 DIAGNOSIS — C539 Malignant neoplasm of cervix uteri, unspecified: Secondary | ICD-10-CM | POA: Diagnosis not present

## 2020-03-06 DIAGNOSIS — Z85841 Personal history of malignant neoplasm of brain: Secondary | ICD-10-CM | POA: Diagnosis not present

## 2020-03-06 DIAGNOSIS — Z800 Family history of malignant neoplasm of digestive organs: Secondary | ICD-10-CM | POA: Diagnosis not present

## 2020-03-06 DIAGNOSIS — R972 Elevated prostate specific antigen [PSA]: Secondary | ICD-10-CM | POA: Diagnosis not present

## 2020-03-06 DIAGNOSIS — K224 Dyskinesia of esophagus: Secondary | ICD-10-CM | POA: Diagnosis not present

## 2020-03-06 DIAGNOSIS — I951 Orthostatic hypotension: Secondary | ICD-10-CM | POA: Diagnosis not present

## 2020-03-06 DIAGNOSIS — L700 Acne vulgaris: Secondary | ICD-10-CM | POA: Diagnosis not present

## 2020-03-06 DIAGNOSIS — M11231 Other chondrocalcinosis, right wrist: Secondary | ICD-10-CM | POA: Diagnosis not present

## 2020-03-06 DIAGNOSIS — D2272 Melanocytic nevi of left lower limb, including hip: Secondary | ICD-10-CM | POA: Diagnosis not present

## 2020-03-06 DIAGNOSIS — Z813 Family history of other psychoactive substance abuse and dependence: Secondary | ICD-10-CM | POA: Diagnosis not present

## 2020-03-06 DIAGNOSIS — C779 Secondary and unspecified malignant neoplasm of lymph node, unspecified: Secondary | ICD-10-CM | POA: Diagnosis not present

## 2020-03-06 DIAGNOSIS — I25119 Atherosclerotic heart disease of native coronary artery with unspecified angina pectoris: Secondary | ICD-10-CM | POA: Diagnosis not present

## 2020-03-06 DIAGNOSIS — G894 Chronic pain syndrome: Secondary | ICD-10-CM | POA: Diagnosis not present

## 2020-03-06 DIAGNOSIS — M79622 Pain in left upper arm: Secondary | ICD-10-CM | POA: Diagnosis not present

## 2020-03-06 DIAGNOSIS — S20462A Insect bite (nonvenomous) of left back wall of thorax, initial encounter: Secondary | ICD-10-CM | POA: Diagnosis not present

## 2020-03-06 DIAGNOSIS — H04121 Dry eye syndrome of right lacrimal gland: Secondary | ICD-10-CM | POA: Diagnosis not present

## 2020-03-06 DIAGNOSIS — J019 Acute sinusitis, unspecified: Secondary | ICD-10-CM | POA: Diagnosis not present

## 2020-03-06 DIAGNOSIS — Z86010 Personal history of colonic polyps: Secondary | ICD-10-CM | POA: Diagnosis not present

## 2020-03-06 DIAGNOSIS — K5130 Ulcerative (chronic) rectosigmoiditis without complications: Secondary | ICD-10-CM | POA: Diagnosis not present

## 2020-03-06 DIAGNOSIS — S81801A Unspecified open wound, right lower leg, initial encounter: Secondary | ICD-10-CM | POA: Diagnosis not present

## 2020-03-06 DIAGNOSIS — J90 Pleural effusion, not elsewhere classified: Secondary | ICD-10-CM | POA: Diagnosis not present

## 2020-03-06 DIAGNOSIS — M40292 Other kyphosis, cervical region: Secondary | ICD-10-CM | POA: Diagnosis not present

## 2020-03-06 DIAGNOSIS — H25812 Combined forms of age-related cataract, left eye: Secondary | ICD-10-CM | POA: Diagnosis not present

## 2020-03-06 DIAGNOSIS — Z8711 Personal history of peptic ulcer disease: Secondary | ICD-10-CM | POA: Diagnosis not present

## 2020-03-06 DIAGNOSIS — Z87440 Personal history of urinary (tract) infections: Secondary | ICD-10-CM | POA: Diagnosis not present

## 2020-03-06 DIAGNOSIS — L503 Dermatographic urticaria: Secondary | ICD-10-CM | POA: Diagnosis not present

## 2020-03-06 DIAGNOSIS — F424 Excoriation (skin-picking) disorder: Secondary | ICD-10-CM | POA: Diagnosis not present

## 2020-03-06 DIAGNOSIS — M5414 Radiculopathy, thoracic region: Secondary | ICD-10-CM | POA: Diagnosis not present

## 2020-03-06 DIAGNOSIS — Z96653 Presence of artificial knee joint, bilateral: Secondary | ICD-10-CM | POA: Diagnosis not present

## 2020-03-06 DIAGNOSIS — F325 Major depressive disorder, single episode, in full remission: Secondary | ICD-10-CM | POA: Diagnosis not present

## 2020-03-06 DIAGNOSIS — M47816 Spondylosis without myelopathy or radiculopathy, lumbar region: Secondary | ICD-10-CM | POA: Diagnosis not present

## 2020-03-06 DIAGNOSIS — M5412 Radiculopathy, cervical region: Secondary | ICD-10-CM | POA: Diagnosis not present

## 2020-03-06 DIAGNOSIS — J9601 Acute respiratory failure with hypoxia: Secondary | ICD-10-CM | POA: Diagnosis not present

## 2020-03-06 DIAGNOSIS — M79671 Pain in right foot: Secondary | ICD-10-CM | POA: Diagnosis not present

## 2020-03-06 DIAGNOSIS — G252 Other specified forms of tremor: Secondary | ICD-10-CM | POA: Diagnosis not present

## 2020-03-06 DIAGNOSIS — N481 Balanitis: Secondary | ICD-10-CM | POA: Diagnosis not present

## 2020-03-06 DIAGNOSIS — Z6839 Body mass index (BMI) 39.0-39.9, adult: Secondary | ICD-10-CM | POA: Diagnosis not present

## 2020-03-06 DIAGNOSIS — R062 Wheezing: Secondary | ICD-10-CM | POA: Diagnosis not present

## 2020-03-06 DIAGNOSIS — M48062 Spinal stenosis, lumbar region with neurogenic claudication: Secondary | ICD-10-CM | POA: Diagnosis not present

## 2020-03-06 DIAGNOSIS — I10 Essential (primary) hypertension: Secondary | ICD-10-CM | POA: Diagnosis not present

## 2020-03-06 DIAGNOSIS — Z9861 Coronary angioplasty status: Secondary | ICD-10-CM | POA: Diagnosis not present

## 2020-03-06 DIAGNOSIS — G5131 Clonic hemifacial spasm, right: Secondary | ICD-10-CM | POA: Diagnosis not present

## 2020-03-06 DIAGNOSIS — M19071 Primary osteoarthritis, right ankle and foot: Secondary | ICD-10-CM | POA: Diagnosis not present

## 2020-03-06 DIAGNOSIS — G43109 Migraine with aura, not intractable, without status migrainosus: Secondary | ICD-10-CM | POA: Diagnosis not present

## 2020-03-06 DIAGNOSIS — S93491A Sprain of other ligament of right ankle, initial encounter: Secondary | ICD-10-CM | POA: Diagnosis not present

## 2020-03-06 DIAGNOSIS — F439 Reaction to severe stress, unspecified: Secondary | ICD-10-CM | POA: Diagnosis not present

## 2020-03-06 DIAGNOSIS — M3501 Sicca syndrome with keratoconjunctivitis: Secondary | ICD-10-CM | POA: Diagnosis not present

## 2020-03-06 DIAGNOSIS — D508 Other iron deficiency anemias: Secondary | ICD-10-CM | POA: Diagnosis not present

## 2020-03-06 DIAGNOSIS — M79651 Pain in right thigh: Secondary | ICD-10-CM | POA: Diagnosis not present

## 2020-03-06 DIAGNOSIS — D6869 Other thrombophilia: Secondary | ICD-10-CM | POA: Diagnosis not present

## 2020-03-06 DIAGNOSIS — S52501A Unspecified fracture of the lower end of right radius, initial encounter for closed fracture: Secondary | ICD-10-CM | POA: Diagnosis not present

## 2020-03-06 DIAGNOSIS — I4510 Unspecified right bundle-branch block: Secondary | ICD-10-CM | POA: Diagnosis not present

## 2020-03-06 DIAGNOSIS — D49511 Neoplasm of unspecified behavior of right kidney: Secondary | ICD-10-CM | POA: Diagnosis not present

## 2020-03-06 DIAGNOSIS — T451X5A Adverse effect of antineoplastic and immunosuppressive drugs, initial encounter: Secondary | ICD-10-CM | POA: Diagnosis not present

## 2020-03-06 DIAGNOSIS — C50812 Malignant neoplasm of overlapping sites of left female breast: Secondary | ICD-10-CM | POA: Diagnosis not present

## 2020-03-06 DIAGNOSIS — M50322 Other cervical disc degeneration at C5-C6 level: Secondary | ICD-10-CM | POA: Diagnosis not present

## 2020-03-06 DIAGNOSIS — C50919 Malignant neoplasm of unspecified site of unspecified female breast: Secondary | ICD-10-CM | POA: Diagnosis not present

## 2020-03-06 DIAGNOSIS — G479 Sleep disorder, unspecified: Secondary | ICD-10-CM | POA: Diagnosis not present

## 2020-03-06 DIAGNOSIS — L729 Follicular cyst of the skin and subcutaneous tissue, unspecified: Secondary | ICD-10-CM | POA: Diagnosis not present

## 2020-03-06 DIAGNOSIS — R109 Unspecified abdominal pain: Secondary | ICD-10-CM | POA: Diagnosis not present

## 2020-03-06 DIAGNOSIS — M7581 Other shoulder lesions, right shoulder: Secondary | ICD-10-CM | POA: Diagnosis not present

## 2020-03-06 DIAGNOSIS — E663 Overweight: Secondary | ICD-10-CM | POA: Diagnosis not present

## 2020-03-06 DIAGNOSIS — M2032 Hallux varus (acquired), left foot: Secondary | ICD-10-CM | POA: Diagnosis not present

## 2020-03-06 DIAGNOSIS — H4051X2 Glaucoma secondary to other eye disorders, right eye, moderate stage: Secondary | ICD-10-CM | POA: Diagnosis not present

## 2020-03-06 DIAGNOSIS — I890 Lymphedema, not elsewhere classified: Secondary | ICD-10-CM | POA: Diagnosis not present

## 2020-03-06 DIAGNOSIS — N641 Fat necrosis of breast: Secondary | ICD-10-CM | POA: Diagnosis not present

## 2020-03-06 DIAGNOSIS — L821 Other seborrheic keratosis: Secondary | ICD-10-CM | POA: Diagnosis not present

## 2020-03-06 DIAGNOSIS — R1010 Upper abdominal pain, unspecified: Secondary | ICD-10-CM | POA: Diagnosis not present

## 2020-03-06 DIAGNOSIS — R071 Chest pain on breathing: Secondary | ICD-10-CM | POA: Diagnosis not present

## 2020-03-06 DIAGNOSIS — D72829 Elevated white blood cell count, unspecified: Secondary | ICD-10-CM | POA: Diagnosis not present

## 2020-03-06 DIAGNOSIS — K913 Postprocedural intestinal obstruction, unspecified as to partial versus complete: Secondary | ICD-10-CM | POA: Diagnosis not present

## 2020-03-06 DIAGNOSIS — M2042 Other hammer toe(s) (acquired), left foot: Secondary | ICD-10-CM | POA: Diagnosis not present

## 2020-03-06 DIAGNOSIS — E781 Pure hyperglyceridemia: Secondary | ICD-10-CM | POA: Diagnosis not present

## 2020-03-06 DIAGNOSIS — H40053 Ocular hypertension, bilateral: Secondary | ICD-10-CM | POA: Diagnosis not present

## 2020-03-06 DIAGNOSIS — H903 Sensorineural hearing loss, bilateral: Secondary | ICD-10-CM | POA: Diagnosis not present

## 2020-03-06 DIAGNOSIS — N368 Other specified disorders of urethra: Secondary | ICD-10-CM | POA: Diagnosis not present

## 2020-03-06 DIAGNOSIS — H6123 Impacted cerumen, bilateral: Secondary | ICD-10-CM | POA: Diagnosis not present

## 2020-03-06 DIAGNOSIS — D3132 Benign neoplasm of left choroid: Secondary | ICD-10-CM | POA: Diagnosis not present

## 2020-03-06 DIAGNOSIS — M25572 Pain in left ankle and joints of left foot: Secondary | ICD-10-CM | POA: Diagnosis not present

## 2020-03-06 DIAGNOSIS — S42402A Unspecified fracture of lower end of left humerus, initial encounter for closed fracture: Secondary | ICD-10-CM | POA: Diagnosis not present

## 2020-03-06 DIAGNOSIS — J189 Pneumonia, unspecified organism: Secondary | ICD-10-CM | POA: Diagnosis not present

## 2020-03-06 DIAGNOSIS — H68009 Unspecified Eustachian salpingitis, unspecified ear: Secondary | ICD-10-CM | POA: Diagnosis not present

## 2020-03-06 DIAGNOSIS — E1140 Type 2 diabetes mellitus with diabetic neuropathy, unspecified: Secondary | ICD-10-CM | POA: Diagnosis not present

## 2020-03-06 DIAGNOSIS — S62102B Fracture of unspecified carpal bone, left wrist, initial encounter for open fracture: Secondary | ICD-10-CM | POA: Diagnosis not present

## 2020-03-06 DIAGNOSIS — H6981 Other specified disorders of Eustachian tube, right ear: Secondary | ICD-10-CM | POA: Diagnosis not present

## 2020-03-06 DIAGNOSIS — E6609 Other obesity due to excess calories: Secondary | ICD-10-CM | POA: Diagnosis not present

## 2020-03-06 DIAGNOSIS — Z820 Family history of epilepsy and other diseases of the nervous system: Secondary | ICD-10-CM | POA: Diagnosis not present

## 2020-03-06 DIAGNOSIS — M7061 Trochanteric bursitis, right hip: Secondary | ICD-10-CM | POA: Diagnosis not present

## 2020-03-06 DIAGNOSIS — Z8551 Personal history of malignant neoplasm of bladder: Secondary | ICD-10-CM | POA: Diagnosis not present

## 2020-03-06 DIAGNOSIS — S51802A Unspecified open wound of left forearm, initial encounter: Secondary | ICD-10-CM | POA: Diagnosis not present

## 2020-03-06 DIAGNOSIS — S0010XA Contusion of unspecified eyelid and periocular area, initial encounter: Secondary | ICD-10-CM | POA: Diagnosis not present

## 2020-03-06 DIAGNOSIS — I8312 Varicose veins of left lower extremity with inflammation: Secondary | ICD-10-CM | POA: Diagnosis not present

## 2020-03-06 DIAGNOSIS — E249 Cushing's syndrome, unspecified: Secondary | ICD-10-CM | POA: Diagnosis not present

## 2020-03-06 DIAGNOSIS — I872 Venous insufficiency (chronic) (peripheral): Secondary | ICD-10-CM | POA: Diagnosis not present

## 2020-03-06 DIAGNOSIS — L603 Nail dystrophy: Secondary | ICD-10-CM | POA: Diagnosis not present

## 2020-03-06 DIAGNOSIS — Z944 Liver transplant status: Secondary | ICD-10-CM | POA: Diagnosis not present

## 2020-03-06 DIAGNOSIS — E1149 Type 2 diabetes mellitus with other diabetic neurological complication: Secondary | ICD-10-CM | POA: Diagnosis not present

## 2020-03-06 DIAGNOSIS — H8110 Benign paroxysmal vertigo, unspecified ear: Secondary | ICD-10-CM | POA: Diagnosis not present

## 2020-03-06 DIAGNOSIS — M65331 Trigger finger, right middle finger: Secondary | ICD-10-CM | POA: Diagnosis not present

## 2020-03-06 DIAGNOSIS — N3001 Acute cystitis with hematuria: Secondary | ICD-10-CM | POA: Diagnosis not present

## 2020-03-06 DIAGNOSIS — M79604 Pain in right leg: Secondary | ICD-10-CM | POA: Diagnosis not present

## 2020-03-06 DIAGNOSIS — F449 Dissociative and conversion disorder, unspecified: Secondary | ICD-10-CM | POA: Diagnosis not present

## 2020-03-06 DIAGNOSIS — I495 Sick sinus syndrome: Secondary | ICD-10-CM | POA: Diagnosis not present

## 2020-03-06 DIAGNOSIS — Z95810 Presence of automatic (implantable) cardiac defibrillator: Secondary | ICD-10-CM | POA: Diagnosis not present

## 2020-03-06 DIAGNOSIS — E7801 Familial hypercholesterolemia: Secondary | ICD-10-CM | POA: Diagnosis not present

## 2020-03-06 DIAGNOSIS — M869 Osteomyelitis, unspecified: Secondary | ICD-10-CM | POA: Diagnosis not present

## 2020-03-06 DIAGNOSIS — G4700 Insomnia, unspecified: Secondary | ICD-10-CM | POA: Diagnosis not present

## 2020-03-06 DIAGNOSIS — I341 Nonrheumatic mitral (valve) prolapse: Secondary | ICD-10-CM | POA: Diagnosis not present

## 2020-03-06 DIAGNOSIS — G43911 Migraine, unspecified, intractable, with status migrainosus: Secondary | ICD-10-CM | POA: Diagnosis not present

## 2020-03-06 DIAGNOSIS — H9122 Sudden idiopathic hearing loss, left ear: Secondary | ICD-10-CM | POA: Diagnosis not present

## 2020-03-06 DIAGNOSIS — Z8261 Family history of arthritis: Secondary | ICD-10-CM | POA: Diagnosis not present

## 2020-03-06 DIAGNOSIS — Z7409 Other reduced mobility: Secondary | ICD-10-CM | POA: Diagnosis not present

## 2020-03-06 DIAGNOSIS — D72819 Decreased white blood cell count, unspecified: Secondary | ICD-10-CM | POA: Diagnosis not present

## 2020-03-06 DIAGNOSIS — R040 Epistaxis: Secondary | ICD-10-CM | POA: Diagnosis not present

## 2020-03-06 DIAGNOSIS — H8109 Meniere's disease, unspecified ear: Secondary | ICD-10-CM | POA: Diagnosis not present

## 2020-03-06 DIAGNOSIS — S52509B Unspecified fracture of the lower end of unspecified radius, initial encounter for open fracture type I or II: Secondary | ICD-10-CM | POA: Diagnosis not present

## 2020-03-06 DIAGNOSIS — M1A0720 Idiopathic chronic gout, left ankle and foot, without tophus (tophi): Secondary | ICD-10-CM | POA: Diagnosis not present

## 2020-03-06 DIAGNOSIS — R600 Localized edema: Secondary | ICD-10-CM | POA: Diagnosis not present

## 2020-03-06 DIAGNOSIS — R430 Anosmia: Secondary | ICD-10-CM | POA: Diagnosis not present

## 2020-03-06 DIAGNOSIS — M25552 Pain in left hip: Secondary | ICD-10-CM | POA: Diagnosis not present

## 2020-03-06 DIAGNOSIS — R195 Other fecal abnormalities: Secondary | ICD-10-CM | POA: Diagnosis not present

## 2020-03-06 DIAGNOSIS — L7211 Pilar cyst: Secondary | ICD-10-CM | POA: Diagnosis not present

## 2020-03-06 DIAGNOSIS — M1711 Unilateral primary osteoarthritis, right knee: Secondary | ICD-10-CM | POA: Diagnosis not present

## 2020-03-06 DIAGNOSIS — I6529 Occlusion and stenosis of unspecified carotid artery: Secondary | ICD-10-CM | POA: Diagnosis not present

## 2020-03-06 DIAGNOSIS — Z1159 Encounter for screening for other viral diseases: Secondary | ICD-10-CM | POA: Diagnosis not present

## 2020-03-06 DIAGNOSIS — H9192 Unspecified hearing loss, left ear: Secondary | ICD-10-CM | POA: Diagnosis not present

## 2020-03-06 DIAGNOSIS — Z7189 Other specified counseling: Secondary | ICD-10-CM | POA: Diagnosis not present

## 2020-03-06 DIAGNOSIS — R338 Other retention of urine: Secondary | ICD-10-CM | POA: Diagnosis not present

## 2020-03-06 DIAGNOSIS — M4327 Fusion of spine, lumbosacral region: Secondary | ICD-10-CM | POA: Diagnosis not present

## 2020-03-06 DIAGNOSIS — Z91041 Radiographic dye allergy status: Secondary | ICD-10-CM | POA: Diagnosis not present

## 2020-03-06 DIAGNOSIS — M5032 Other cervical disc degeneration, mid-cervical region, unspecified level: Secondary | ICD-10-CM | POA: Diagnosis not present

## 2020-03-06 DIAGNOSIS — H02885 Meibomian gland dysfunction left lower eyelid: Secondary | ICD-10-CM | POA: Diagnosis not present

## 2020-03-06 DIAGNOSIS — L905 Scar conditions and fibrosis of skin: Secondary | ICD-10-CM | POA: Diagnosis not present

## 2020-03-06 DIAGNOSIS — M720 Palmar fascial fibromatosis [Dupuytren]: Secondary | ICD-10-CM | POA: Diagnosis not present

## 2020-03-06 DIAGNOSIS — H353221 Exudative age-related macular degeneration, left eye, with active choroidal neovascularization: Secondary | ICD-10-CM | POA: Diagnosis not present

## 2020-03-06 DIAGNOSIS — M353 Polymyalgia rheumatica: Secondary | ICD-10-CM | POA: Diagnosis not present

## 2020-03-06 DIAGNOSIS — R9431 Abnormal electrocardiogram [ECG] [EKG]: Secondary | ICD-10-CM | POA: Diagnosis not present

## 2020-03-06 DIAGNOSIS — M85852 Other specified disorders of bone density and structure, left thigh: Secondary | ICD-10-CM | POA: Diagnosis not present

## 2020-03-06 DIAGNOSIS — R180 Malignant ascites: Secondary | ICD-10-CM | POA: Diagnosis not present

## 2020-03-06 DIAGNOSIS — M4312 Spondylolisthesis, cervical region: Secondary | ICD-10-CM | POA: Diagnosis not present

## 2020-03-06 DIAGNOSIS — J310 Chronic rhinitis: Secondary | ICD-10-CM | POA: Diagnosis not present

## 2020-03-06 DIAGNOSIS — E871 Hypo-osmolality and hyponatremia: Secondary | ICD-10-CM | POA: Diagnosis not present

## 2020-03-06 DIAGNOSIS — S41111A Laceration without foreign body of right upper arm, initial encounter: Secondary | ICD-10-CM | POA: Diagnosis not present

## 2020-03-06 DIAGNOSIS — Z6826 Body mass index (BMI) 26.0-26.9, adult: Secondary | ICD-10-CM | POA: Diagnosis not present

## 2020-03-06 DIAGNOSIS — K5904 Chronic idiopathic constipation: Secondary | ICD-10-CM | POA: Diagnosis not present

## 2020-03-06 DIAGNOSIS — B181 Chronic viral hepatitis B without delta-agent: Secondary | ICD-10-CM | POA: Diagnosis not present

## 2020-03-06 DIAGNOSIS — N1831 Chronic kidney disease, stage 3a: Secondary | ICD-10-CM | POA: Diagnosis not present

## 2020-03-06 DIAGNOSIS — H18521 Epithelial (juvenile) corneal dystrophy, right eye: Secondary | ICD-10-CM | POA: Diagnosis not present

## 2020-03-06 DIAGNOSIS — R2230 Localized swelling, mass and lump, unspecified upper limb: Secondary | ICD-10-CM | POA: Diagnosis not present

## 2020-03-06 DIAGNOSIS — M25511 Pain in right shoulder: Secondary | ICD-10-CM | POA: Diagnosis not present

## 2020-03-06 DIAGNOSIS — F900 Attention-deficit hyperactivity disorder, predominantly inattentive type: Secondary | ICD-10-CM | POA: Diagnosis not present

## 2020-03-06 DIAGNOSIS — H6121 Impacted cerumen, right ear: Secondary | ICD-10-CM | POA: Diagnosis not present

## 2020-03-06 DIAGNOSIS — F4310 Post-traumatic stress disorder, unspecified: Secondary | ICD-10-CM | POA: Diagnosis not present

## 2020-03-06 DIAGNOSIS — H1045 Other chronic allergic conjunctivitis: Secondary | ICD-10-CM | POA: Diagnosis not present

## 2020-03-06 DIAGNOSIS — H90A22 Sensorineural hearing loss, unilateral, left ear, with restricted hearing on the contralateral side: Secondary | ICD-10-CM | POA: Diagnosis not present

## 2020-03-06 DIAGNOSIS — Z0189 Encounter for other specified special examinations: Secondary | ICD-10-CM | POA: Diagnosis not present

## 2020-03-06 DIAGNOSIS — E113511 Type 2 diabetes mellitus with proliferative diabetic retinopathy with macular edema, right eye: Secondary | ICD-10-CM | POA: Diagnosis not present

## 2020-03-06 DIAGNOSIS — H02834 Dermatochalasis of left upper eyelid: Secondary | ICD-10-CM | POA: Diagnosis not present

## 2020-03-06 DIAGNOSIS — G130 Paraneoplastic neuromyopathy and neuropathy: Secondary | ICD-10-CM | POA: Diagnosis not present

## 2020-03-06 DIAGNOSIS — L84 Corns and callosities: Secondary | ICD-10-CM | POA: Diagnosis not present

## 2020-03-06 DIAGNOSIS — I0981 Rheumatic heart failure: Secondary | ICD-10-CM | POA: Diagnosis not present

## 2020-03-06 DIAGNOSIS — C220 Liver cell carcinoma: Secondary | ICD-10-CM | POA: Diagnosis not present

## 2020-03-06 DIAGNOSIS — R05 Cough: Secondary | ICD-10-CM | POA: Diagnosis not present

## 2020-03-06 DIAGNOSIS — M259 Joint disorder, unspecified: Secondary | ICD-10-CM | POA: Diagnosis not present

## 2020-03-06 DIAGNOSIS — R269 Unspecified abnormalities of gait and mobility: Secondary | ICD-10-CM | POA: Diagnosis not present

## 2020-03-06 DIAGNOSIS — E7849 Other hyperlipidemia: Secondary | ICD-10-CM | POA: Diagnosis not present

## 2020-03-06 DIAGNOSIS — Y999 Unspecified external cause status: Secondary | ICD-10-CM | POA: Diagnosis not present

## 2020-03-06 DIAGNOSIS — Z982 Presence of cerebrospinal fluid drainage device: Secondary | ICD-10-CM | POA: Diagnosis not present

## 2020-03-06 DIAGNOSIS — M8580 Other specified disorders of bone density and structure, unspecified site: Secondary | ICD-10-CM | POA: Diagnosis not present

## 2020-03-06 DIAGNOSIS — I25810 Atherosclerosis of coronary artery bypass graft(s) without angina pectoris: Secondary | ICD-10-CM | POA: Diagnosis not present

## 2020-03-06 DIAGNOSIS — D123 Benign neoplasm of transverse colon: Secondary | ICD-10-CM | POA: Diagnosis not present

## 2020-03-06 DIAGNOSIS — Z6824 Body mass index (BMI) 24.0-24.9, adult: Secondary | ICD-10-CM | POA: Diagnosis not present

## 2020-03-06 DIAGNOSIS — M79675 Pain in left toe(s): Secondary | ICD-10-CM | POA: Diagnosis not present

## 2020-03-06 DIAGNOSIS — I4811 Longstanding persistent atrial fibrillation: Secondary | ICD-10-CM | POA: Diagnosis not present

## 2020-03-06 DIAGNOSIS — L989 Disorder of the skin and subcutaneous tissue, unspecified: Secondary | ICD-10-CM | POA: Diagnosis not present

## 2020-03-06 DIAGNOSIS — M7502 Adhesive capsulitis of left shoulder: Secondary | ICD-10-CM | POA: Diagnosis not present

## 2020-03-06 DIAGNOSIS — M5413 Radiculopathy, cervicothoracic region: Secondary | ICD-10-CM | POA: Diagnosis not present

## 2020-03-06 DIAGNOSIS — M9902 Segmental and somatic dysfunction of thoracic region: Secondary | ICD-10-CM | POA: Diagnosis not present

## 2020-03-06 DIAGNOSIS — F988 Other specified behavioral and emotional disorders with onset usually occurring in childhood and adolescence: Secondary | ICD-10-CM | POA: Diagnosis not present

## 2020-03-06 DIAGNOSIS — Z636 Dependent relative needing care at home: Secondary | ICD-10-CM | POA: Diagnosis not present

## 2020-03-06 DIAGNOSIS — Z86718 Personal history of other venous thrombosis and embolism: Secondary | ICD-10-CM | POA: Diagnosis not present

## 2020-03-06 DIAGNOSIS — G8918 Other acute postprocedural pain: Secondary | ICD-10-CM | POA: Diagnosis not present

## 2020-03-06 DIAGNOSIS — Z7952 Long term (current) use of systemic steroids: Secondary | ICD-10-CM | POA: Diagnosis not present

## 2020-03-06 DIAGNOSIS — J301 Allergic rhinitis due to pollen: Secondary | ICD-10-CM | POA: Diagnosis not present

## 2020-03-06 DIAGNOSIS — C44729 Squamous cell carcinoma of skin of left lower limb, including hip: Secondary | ICD-10-CM | POA: Diagnosis not present

## 2020-03-06 DIAGNOSIS — J439 Emphysema, unspecified: Secondary | ICD-10-CM | POA: Diagnosis not present

## 2020-03-06 DIAGNOSIS — D638 Anemia in other chronic diseases classified elsewhere: Secondary | ICD-10-CM | POA: Diagnosis not present

## 2020-03-06 DIAGNOSIS — I712 Thoracic aortic aneurysm, without rupture: Secondary | ICD-10-CM | POA: Diagnosis not present

## 2020-03-06 DIAGNOSIS — Z0000 Encounter for general adult medical examination without abnormal findings: Secondary | ICD-10-CM | POA: Diagnosis not present

## 2020-03-06 DIAGNOSIS — S42231A 3-part fracture of surgical neck of right humerus, initial encounter for closed fracture: Secondary | ICD-10-CM | POA: Diagnosis not present

## 2020-03-06 DIAGNOSIS — Z45010 Encounter for checking and testing of cardiac pacemaker pulse generator [battery]: Secondary | ICD-10-CM | POA: Diagnosis not present

## 2020-03-06 DIAGNOSIS — M21371 Foot drop, right foot: Secondary | ICD-10-CM | POA: Diagnosis not present

## 2020-03-06 DIAGNOSIS — M25562 Pain in left knee: Secondary | ICD-10-CM | POA: Diagnosis not present

## 2020-03-06 DIAGNOSIS — Z8673 Personal history of transient ischemic attack (TIA), and cerebral infarction without residual deficits: Secondary | ICD-10-CM | POA: Diagnosis not present

## 2020-03-06 DIAGNOSIS — K2210 Ulcer of esophagus without bleeding: Secondary | ICD-10-CM | POA: Diagnosis not present

## 2020-03-06 DIAGNOSIS — I8001 Phlebitis and thrombophlebitis of superficial vessels of right lower extremity: Secondary | ICD-10-CM | POA: Diagnosis not present

## 2020-03-06 DIAGNOSIS — E041 Nontoxic single thyroid nodule: Secondary | ICD-10-CM | POA: Diagnosis not present

## 2020-03-06 DIAGNOSIS — M25532 Pain in left wrist: Secondary | ICD-10-CM | POA: Diagnosis not present

## 2020-03-06 DIAGNOSIS — J069 Acute upper respiratory infection, unspecified: Secondary | ICD-10-CM | POA: Diagnosis not present

## 2020-03-06 DIAGNOSIS — N6489 Other specified disorders of breast: Secondary | ICD-10-CM | POA: Diagnosis not present

## 2020-03-06 DIAGNOSIS — L97819 Non-pressure chronic ulcer of other part of right lower leg with unspecified severity: Secondary | ICD-10-CM | POA: Diagnosis not present

## 2020-03-06 DIAGNOSIS — D229 Melanocytic nevi, unspecified: Secondary | ICD-10-CM | POA: Diagnosis not present

## 2020-03-06 DIAGNOSIS — E440 Moderate protein-calorie malnutrition: Secondary | ICD-10-CM | POA: Diagnosis not present

## 2020-03-06 DIAGNOSIS — M205X1 Other deformities of toe(s) (acquired), right foot: Secondary | ICD-10-CM | POA: Diagnosis not present

## 2020-03-06 DIAGNOSIS — D0512 Intraductal carcinoma in situ of left breast: Secondary | ICD-10-CM | POA: Diagnosis not present

## 2020-03-06 DIAGNOSIS — R52 Pain, unspecified: Secondary | ICD-10-CM | POA: Diagnosis not present

## 2020-03-06 DIAGNOSIS — E785 Hyperlipidemia, unspecified: Secondary | ICD-10-CM | POA: Diagnosis not present

## 2020-03-06 DIAGNOSIS — E042 Nontoxic multinodular goiter: Secondary | ICD-10-CM | POA: Diagnosis not present

## 2020-03-06 DIAGNOSIS — D472 Monoclonal gammopathy: Secondary | ICD-10-CM | POA: Diagnosis not present

## 2020-03-06 DIAGNOSIS — R6882 Decreased libido: Secondary | ICD-10-CM | POA: Diagnosis not present

## 2020-03-06 DIAGNOSIS — N183 Chronic kidney disease, stage 3 unspecified: Secondary | ICD-10-CM | POA: Diagnosis not present

## 2020-03-06 DIAGNOSIS — H34832 Tributary (branch) retinal vein occlusion, left eye, with macular edema: Secondary | ICD-10-CM | POA: Diagnosis not present

## 2020-03-06 DIAGNOSIS — S51859A Open bite of unspecified forearm, initial encounter: Secondary | ICD-10-CM | POA: Diagnosis not present

## 2020-03-06 DIAGNOSIS — R809 Proteinuria, unspecified: Secondary | ICD-10-CM | POA: Diagnosis not present

## 2020-03-06 DIAGNOSIS — I8390 Asymptomatic varicose veins of unspecified lower extremity: Secondary | ICD-10-CM | POA: Diagnosis not present

## 2020-03-06 DIAGNOSIS — N858 Other specified noninflammatory disorders of uterus: Secondary | ICD-10-CM | POA: Diagnosis not present

## 2020-03-06 DIAGNOSIS — Z466 Encounter for fitting and adjustment of urinary device: Secondary | ICD-10-CM | POA: Diagnosis not present

## 2020-03-06 DIAGNOSIS — Z87891 Personal history of nicotine dependence: Secondary | ICD-10-CM | POA: Diagnosis not present

## 2020-03-06 DIAGNOSIS — D61818 Other pancytopenia: Secondary | ICD-10-CM | POA: Diagnosis not present

## 2020-03-06 DIAGNOSIS — R634 Abnormal weight loss: Secondary | ICD-10-CM | POA: Diagnosis not present

## 2020-03-06 DIAGNOSIS — R4182 Altered mental status, unspecified: Secondary | ICD-10-CM | POA: Diagnosis not present

## 2020-03-06 DIAGNOSIS — M940 Chondrocostal junction syndrome [Tietze]: Secondary | ICD-10-CM | POA: Diagnosis not present

## 2020-03-06 DIAGNOSIS — Z951 Presence of aortocoronary bypass graft: Secondary | ICD-10-CM | POA: Diagnosis not present

## 2020-03-06 DIAGNOSIS — K5732 Diverticulitis of large intestine without perforation or abscess without bleeding: Secondary | ICD-10-CM | POA: Diagnosis not present

## 2020-03-06 DIAGNOSIS — I480 Paroxysmal atrial fibrillation: Secondary | ICD-10-CM | POA: Diagnosis not present

## 2020-03-06 DIAGNOSIS — J441 Chronic obstructive pulmonary disease with (acute) exacerbation: Secondary | ICD-10-CM | POA: Diagnosis not present

## 2020-03-06 DIAGNOSIS — R8290 Unspecified abnormal findings in urine: Secondary | ICD-10-CM | POA: Diagnosis not present

## 2020-03-06 DIAGNOSIS — U071 COVID-19: Secondary | ICD-10-CM | POA: Diagnosis not present

## 2020-03-06 DIAGNOSIS — M79662 Pain in left lower leg: Secondary | ICD-10-CM | POA: Diagnosis not present

## 2020-03-06 DIAGNOSIS — C44329 Squamous cell carcinoma of skin of other parts of face: Secondary | ICD-10-CM | POA: Diagnosis not present

## 2020-03-06 DIAGNOSIS — R310 Gross hematuria: Secondary | ICD-10-CM | POA: Diagnosis not present

## 2020-03-06 DIAGNOSIS — R609 Edema, unspecified: Secondary | ICD-10-CM | POA: Diagnosis not present

## 2020-03-06 DIAGNOSIS — H6983 Other specified disorders of Eustachian tube, bilateral: Secondary | ICD-10-CM | POA: Diagnosis not present

## 2020-03-06 DIAGNOSIS — I25118 Atherosclerotic heart disease of native coronary artery with other forms of angina pectoris: Secondary | ICD-10-CM | POA: Diagnosis not present

## 2020-03-06 DIAGNOSIS — M7051 Other bursitis of knee, right knee: Secondary | ICD-10-CM | POA: Diagnosis not present

## 2020-03-06 DIAGNOSIS — D709 Neutropenia, unspecified: Secondary | ICD-10-CM | POA: Diagnosis not present

## 2020-03-06 DIAGNOSIS — Z6835 Body mass index (BMI) 35.0-35.9, adult: Secondary | ICD-10-CM | POA: Diagnosis not present

## 2020-03-06 DIAGNOSIS — H353112 Nonexudative age-related macular degeneration, right eye, intermediate dry stage: Secondary | ICD-10-CM | POA: Diagnosis not present

## 2020-03-06 DIAGNOSIS — I87311 Chronic venous hypertension (idiopathic) with ulcer of right lower extremity: Secondary | ICD-10-CM | POA: Diagnosis not present

## 2020-03-06 DIAGNOSIS — I38 Endocarditis, valve unspecified: Secondary | ICD-10-CM | POA: Diagnosis not present

## 2020-03-06 DIAGNOSIS — Z08 Encounter for follow-up examination after completed treatment for malignant neoplasm: Secondary | ICD-10-CM | POA: Diagnosis not present

## 2020-03-06 DIAGNOSIS — R339 Retention of urine, unspecified: Secondary | ICD-10-CM | POA: Diagnosis not present

## 2020-03-06 DIAGNOSIS — C3491 Malignant neoplasm of unspecified part of right bronchus or lung: Secondary | ICD-10-CM | POA: Diagnosis not present

## 2020-03-06 DIAGNOSIS — H02884 Meibomian gland dysfunction left upper eyelid: Secondary | ICD-10-CM | POA: Diagnosis not present

## 2020-03-06 DIAGNOSIS — S6991XA Unspecified injury of right wrist, hand and finger(s), initial encounter: Secondary | ICD-10-CM | POA: Diagnosis not present

## 2020-03-06 DIAGNOSIS — N1330 Unspecified hydronephrosis: Secondary | ICD-10-CM | POA: Diagnosis not present

## 2020-03-06 DIAGNOSIS — M25571 Pain in right ankle and joints of right foot: Secondary | ICD-10-CM | POA: Diagnosis not present

## 2020-03-06 DIAGNOSIS — R252 Cramp and spasm: Secondary | ICD-10-CM | POA: Diagnosis not present

## 2020-03-06 DIAGNOSIS — H33301 Unspecified retinal break, right eye: Secondary | ICD-10-CM | POA: Diagnosis not present

## 2020-03-06 DIAGNOSIS — N3000 Acute cystitis without hematuria: Secondary | ICD-10-CM | POA: Diagnosis not present

## 2020-03-06 DIAGNOSIS — M6282 Rhabdomyolysis: Secondary | ICD-10-CM | POA: Diagnosis not present

## 2020-03-06 DIAGNOSIS — S90811A Abrasion, right foot, initial encounter: Secondary | ICD-10-CM | POA: Diagnosis not present

## 2020-03-06 DIAGNOSIS — I700 Atherosclerosis of aorta: Secondary | ICD-10-CM | POA: Diagnosis not present

## 2020-03-06 DIAGNOSIS — S0993XA Unspecified injury of face, initial encounter: Secondary | ICD-10-CM | POA: Diagnosis not present

## 2020-03-06 DIAGNOSIS — I088 Other rheumatic multiple valve diseases: Secondary | ICD-10-CM | POA: Diagnosis not present

## 2020-03-06 DIAGNOSIS — F0390 Unspecified dementia without behavioral disturbance: Secondary | ICD-10-CM | POA: Diagnosis not present

## 2020-03-06 DIAGNOSIS — H579 Unspecified disorder of eye and adnexa: Secondary | ICD-10-CM | POA: Diagnosis not present

## 2020-03-06 DIAGNOSIS — J432 Centrilobular emphysema: Secondary | ICD-10-CM | POA: Diagnosis not present

## 2020-03-06 DIAGNOSIS — Z5111 Encounter for antineoplastic chemotherapy: Secondary | ICD-10-CM | POA: Diagnosis not present

## 2020-03-06 DIAGNOSIS — J129 Viral pneumonia, unspecified: Secondary | ICD-10-CM | POA: Diagnosis not present

## 2020-03-06 DIAGNOSIS — R002 Palpitations: Secondary | ICD-10-CM | POA: Diagnosis not present

## 2020-03-06 DIAGNOSIS — R21 Rash and other nonspecific skin eruption: Secondary | ICD-10-CM | POA: Diagnosis not present

## 2020-03-06 DIAGNOSIS — H02881 Meibomian gland dysfunction right upper eyelid: Secondary | ICD-10-CM | POA: Diagnosis not present

## 2020-03-06 DIAGNOSIS — S43431A Superior glenoid labrum lesion of right shoulder, initial encounter: Secondary | ICD-10-CM | POA: Diagnosis not present

## 2020-03-06 DIAGNOSIS — M25519 Pain in unspecified shoulder: Secondary | ICD-10-CM | POA: Diagnosis not present

## 2020-03-06 DIAGNOSIS — H9190 Unspecified hearing loss, unspecified ear: Secondary | ICD-10-CM | POA: Diagnosis not present

## 2020-03-06 DIAGNOSIS — E11622 Type 2 diabetes mellitus with other skin ulcer: Secondary | ICD-10-CM | POA: Diagnosis not present

## 2020-03-06 DIAGNOSIS — R110 Nausea: Secondary | ICD-10-CM | POA: Diagnosis not present

## 2020-03-06 DIAGNOSIS — K581 Irritable bowel syndrome with constipation: Secondary | ICD-10-CM | POA: Diagnosis not present

## 2020-03-06 DIAGNOSIS — Z634 Disappearance and death of family member: Secondary | ICD-10-CM | POA: Diagnosis not present

## 2020-03-06 DIAGNOSIS — L578 Other skin changes due to chronic exposure to nonionizing radiation: Secondary | ICD-10-CM | POA: Diagnosis not present

## 2020-03-06 DIAGNOSIS — W19XXXD Unspecified fall, subsequent encounter: Secondary | ICD-10-CM | POA: Diagnosis not present

## 2020-03-06 DIAGNOSIS — Z452 Encounter for adjustment and management of vascular access device: Secondary | ICD-10-CM | POA: Diagnosis not present

## 2020-03-06 DIAGNOSIS — L851 Acquired keratosis [keratoderma] palmaris et plantaris: Secondary | ICD-10-CM | POA: Diagnosis not present

## 2020-03-06 DIAGNOSIS — Z794 Long term (current) use of insulin: Secondary | ICD-10-CM | POA: Diagnosis not present

## 2020-03-06 DIAGNOSIS — R011 Cardiac murmur, unspecified: Secondary | ICD-10-CM | POA: Diagnosis not present

## 2020-03-06 DIAGNOSIS — M47817 Spondylosis without myelopathy or radiculopathy, lumbosacral region: Secondary | ICD-10-CM | POA: Diagnosis not present

## 2020-03-06 DIAGNOSIS — M62512 Muscle wasting and atrophy, not elsewhere classified, left shoulder: Secondary | ICD-10-CM | POA: Diagnosis not present

## 2020-03-06 DIAGNOSIS — M79605 Pain in left leg: Secondary | ICD-10-CM | POA: Diagnosis not present

## 2020-03-06 DIAGNOSIS — Z888 Allergy status to other drugs, medicaments and biological substances status: Secondary | ICD-10-CM | POA: Diagnosis not present

## 2020-03-06 DIAGNOSIS — I5022 Chronic systolic (congestive) heart failure: Secondary | ICD-10-CM | POA: Diagnosis not present

## 2020-03-06 DIAGNOSIS — C7889 Secondary malignant neoplasm of other digestive organs: Secondary | ICD-10-CM | POA: Diagnosis not present

## 2020-03-06 DIAGNOSIS — M25531 Pain in right wrist: Secondary | ICD-10-CM | POA: Diagnosis not present

## 2020-03-06 DIAGNOSIS — F339 Major depressive disorder, recurrent, unspecified: Secondary | ICD-10-CM | POA: Diagnosis not present

## 2020-03-06 DIAGNOSIS — M5441 Lumbago with sciatica, right side: Secondary | ICD-10-CM | POA: Diagnosis not present

## 2020-03-06 DIAGNOSIS — M50022 Cervical disc disorder at C5-C6 level with myelopathy: Secondary | ICD-10-CM | POA: Diagnosis not present

## 2020-03-06 DIAGNOSIS — E860 Dehydration: Secondary | ICD-10-CM | POA: Diagnosis not present

## 2020-03-06 DIAGNOSIS — M461 Sacroiliitis, not elsewhere classified: Secondary | ICD-10-CM | POA: Diagnosis not present

## 2020-03-06 DIAGNOSIS — Z90710 Acquired absence of both cervix and uterus: Secondary | ICD-10-CM | POA: Diagnosis not present

## 2020-03-06 DIAGNOSIS — M7712 Lateral epicondylitis, left elbow: Secondary | ICD-10-CM | POA: Diagnosis not present

## 2020-03-06 DIAGNOSIS — M797 Fibromyalgia: Secondary | ICD-10-CM | POA: Diagnosis not present

## 2020-03-06 DIAGNOSIS — F488 Other specified nonpsychotic mental disorders: Secondary | ICD-10-CM | POA: Diagnosis not present

## 2020-03-06 DIAGNOSIS — Z681 Body mass index (BMI) 19 or less, adult: Secondary | ICD-10-CM | POA: Diagnosis not present

## 2020-03-06 DIAGNOSIS — G9050 Complex regional pain syndrome I, unspecified: Secondary | ICD-10-CM | POA: Diagnosis not present

## 2020-03-06 DIAGNOSIS — I2584 Coronary atherosclerosis due to calcified coronary lesion: Secondary | ICD-10-CM | POA: Diagnosis not present

## 2020-03-06 DIAGNOSIS — D0511 Intraductal carcinoma in situ of right breast: Secondary | ICD-10-CM | POA: Diagnosis not present

## 2020-03-06 DIAGNOSIS — K2100 Gastro-esophageal reflux disease with esophagitis, without bleeding: Secondary | ICD-10-CM | POA: Diagnosis not present

## 2020-03-06 DIAGNOSIS — F410 Panic disorder [episodic paroxysmal anxiety] without agoraphobia: Secondary | ICD-10-CM | POA: Diagnosis not present

## 2020-03-06 DIAGNOSIS — M4302 Spondylolysis, cervical region: Secondary | ICD-10-CM | POA: Diagnosis not present

## 2020-03-06 DIAGNOSIS — F17210 Nicotine dependence, cigarettes, uncomplicated: Secondary | ICD-10-CM | POA: Diagnosis not present

## 2020-03-06 DIAGNOSIS — I4819 Other persistent atrial fibrillation: Secondary | ICD-10-CM | POA: Diagnosis not present

## 2020-03-06 DIAGNOSIS — I6523 Occlusion and stenosis of bilateral carotid arteries: Secondary | ICD-10-CM | POA: Diagnosis not present

## 2020-03-06 DIAGNOSIS — E1121 Type 2 diabetes mellitus with diabetic nephropathy: Secondary | ICD-10-CM | POA: Diagnosis not present

## 2020-03-06 DIAGNOSIS — N529 Male erectile dysfunction, unspecified: Secondary | ICD-10-CM | POA: Diagnosis not present

## 2020-03-06 DIAGNOSIS — C50911 Malignant neoplasm of unspecified site of right female breast: Secondary | ICD-10-CM | POA: Diagnosis not present

## 2020-03-06 DIAGNOSIS — R300 Dysuria: Secondary | ICD-10-CM | POA: Diagnosis not present

## 2020-03-06 DIAGNOSIS — E271 Primary adrenocortical insufficiency: Secondary | ICD-10-CM | POA: Diagnosis not present

## 2020-03-06 DIAGNOSIS — R7301 Impaired fasting glucose: Secondary | ICD-10-CM | POA: Diagnosis not present

## 2020-03-06 DIAGNOSIS — Z6821 Body mass index (BMI) 21.0-21.9, adult: Secondary | ICD-10-CM | POA: Diagnosis not present

## 2020-03-06 DIAGNOSIS — I63112 Cerebral infarction due to embolism of left vertebral artery: Secondary | ICD-10-CM | POA: Diagnosis not present

## 2020-03-06 DIAGNOSIS — K582 Mixed irritable bowel syndrome: Secondary | ICD-10-CM | POA: Diagnosis not present

## 2020-03-06 DIAGNOSIS — R0602 Shortness of breath: Secondary | ICD-10-CM | POA: Diagnosis not present

## 2020-03-06 DIAGNOSIS — R293 Abnormal posture: Secondary | ICD-10-CM | POA: Diagnosis not present

## 2020-03-06 DIAGNOSIS — H5203 Hypermetropia, bilateral: Secondary | ICD-10-CM | POA: Diagnosis not present

## 2020-03-06 DIAGNOSIS — I340 Nonrheumatic mitral (valve) insufficiency: Secondary | ICD-10-CM | POA: Diagnosis not present

## 2020-03-06 DIAGNOSIS — H21233 Degeneration of iris (pigmentary), bilateral: Secondary | ICD-10-CM | POA: Diagnosis not present

## 2020-03-06 DIAGNOSIS — J4540 Moderate persistent asthma, uncomplicated: Secondary | ICD-10-CM | POA: Diagnosis not present

## 2020-03-06 DIAGNOSIS — Z8669 Personal history of other diseases of the nervous system and sense organs: Secondary | ICD-10-CM | POA: Diagnosis not present

## 2020-03-06 DIAGNOSIS — S0003XA Contusion of scalp, initial encounter: Secondary | ICD-10-CM | POA: Diagnosis not present

## 2020-03-06 DIAGNOSIS — C7651 Malignant neoplasm of right lower limb: Secondary | ICD-10-CM | POA: Diagnosis not present

## 2020-03-06 DIAGNOSIS — H43811 Vitreous degeneration, right eye: Secondary | ICD-10-CM | POA: Diagnosis not present

## 2020-03-06 DIAGNOSIS — G63 Polyneuropathy in diseases classified elsewhere: Secondary | ICD-10-CM | POA: Diagnosis not present

## 2020-03-06 DIAGNOSIS — Z1212 Encounter for screening for malignant neoplasm of rectum: Secondary | ICD-10-CM | POA: Diagnosis not present

## 2020-03-06 DIAGNOSIS — H401122 Primary open-angle glaucoma, left eye, moderate stage: Secondary | ICD-10-CM | POA: Diagnosis not present

## 2020-03-06 DIAGNOSIS — I714 Abdominal aortic aneurysm, without rupture: Secondary | ICD-10-CM | POA: Diagnosis not present

## 2020-03-06 DIAGNOSIS — C4491 Basal cell carcinoma of skin, unspecified: Secondary | ICD-10-CM | POA: Diagnosis not present

## 2020-03-06 DIAGNOSIS — H04129 Dry eye syndrome of unspecified lacrimal gland: Secondary | ICD-10-CM | POA: Diagnosis not present

## 2020-03-06 DIAGNOSIS — J479 Bronchiectasis, uncomplicated: Secondary | ICD-10-CM | POA: Diagnosis not present

## 2020-03-06 DIAGNOSIS — I69954 Hemiplegia and hemiparesis following unspecified cerebrovascular disease affecting left non-dominant side: Secondary | ICD-10-CM | POA: Diagnosis not present

## 2020-03-06 DIAGNOSIS — Z880 Allergy status to penicillin: Secondary | ICD-10-CM | POA: Diagnosis not present

## 2020-03-06 DIAGNOSIS — K760 Fatty (change of) liver, not elsewhere classified: Secondary | ICD-10-CM | POA: Diagnosis not present

## 2020-03-06 DIAGNOSIS — Z6827 Body mass index (BMI) 27.0-27.9, adult: Secondary | ICD-10-CM | POA: Diagnosis not present

## 2020-03-06 DIAGNOSIS — J449 Chronic obstructive pulmonary disease, unspecified: Secondary | ICD-10-CM | POA: Diagnosis not present

## 2020-03-06 DIAGNOSIS — Z85118 Personal history of other malignant neoplasm of bronchus and lung: Secondary | ICD-10-CM | POA: Diagnosis not present

## 2020-03-06 DIAGNOSIS — R1011 Right upper quadrant pain: Secondary | ICD-10-CM | POA: Diagnosis not present

## 2020-03-06 DIAGNOSIS — Z96651 Presence of right artificial knee joint: Secondary | ICD-10-CM | POA: Diagnosis not present

## 2020-03-06 DIAGNOSIS — J9600 Acute respiratory failure, unspecified whether with hypoxia or hypercapnia: Secondary | ICD-10-CM | POA: Diagnosis not present

## 2020-03-06 DIAGNOSIS — M13841 Other specified arthritis, right hand: Secondary | ICD-10-CM | POA: Diagnosis not present

## 2020-03-06 DIAGNOSIS — Z6825 Body mass index (BMI) 25.0-25.9, adult: Secondary | ICD-10-CM | POA: Diagnosis not present

## 2020-03-06 DIAGNOSIS — Y92009 Unspecified place in unspecified non-institutional (private) residence as the place of occurrence of the external cause: Secondary | ICD-10-CM | POA: Diagnosis not present

## 2020-03-06 DIAGNOSIS — H40013 Open angle with borderline findings, low risk, bilateral: Secondary | ICD-10-CM | POA: Diagnosis not present

## 2020-03-06 DIAGNOSIS — Z8249 Family history of ischemic heart disease and other diseases of the circulatory system: Secondary | ICD-10-CM | POA: Diagnosis not present

## 2020-03-06 DIAGNOSIS — D469 Myelodysplastic syndrome, unspecified: Secondary | ICD-10-CM | POA: Diagnosis not present

## 2020-03-06 DIAGNOSIS — J0121 Acute recurrent ethmoidal sinusitis: Secondary | ICD-10-CM | POA: Diagnosis not present

## 2020-03-06 DIAGNOSIS — G35 Multiple sclerosis: Secondary | ICD-10-CM | POA: Diagnosis not present

## 2020-03-06 DIAGNOSIS — I83893 Varicose veins of bilateral lower extremities with other complications: Secondary | ICD-10-CM | POA: Diagnosis not present

## 2020-03-06 DIAGNOSIS — H1013 Acute atopic conjunctivitis, bilateral: Secondary | ICD-10-CM | POA: Diagnosis not present

## 2020-03-06 DIAGNOSIS — E222 Syndrome of inappropriate secretion of antidiuretic hormone: Secondary | ICD-10-CM | POA: Diagnosis not present

## 2020-03-06 DIAGNOSIS — D72820 Lymphocytosis (symptomatic): Secondary | ICD-10-CM | POA: Diagnosis not present

## 2020-03-06 DIAGNOSIS — Z7902 Long term (current) use of antithrombotics/antiplatelets: Secondary | ICD-10-CM | POA: Diagnosis not present

## 2020-03-06 DIAGNOSIS — H16143 Punctate keratitis, bilateral: Secondary | ICD-10-CM | POA: Diagnosis not present

## 2020-03-06 DIAGNOSIS — H43812 Vitreous degeneration, left eye: Secondary | ICD-10-CM | POA: Diagnosis not present

## 2020-03-06 DIAGNOSIS — I63212 Cerebral infarction due to unspecified occlusion or stenosis of left vertebral arteries: Secondary | ICD-10-CM | POA: Diagnosis not present

## 2020-03-06 DIAGNOSIS — M62830 Muscle spasm of back: Secondary | ICD-10-CM | POA: Diagnosis not present

## 2020-03-06 DIAGNOSIS — E11319 Type 2 diabetes mellitus with unspecified diabetic retinopathy without macular edema: Secondary | ICD-10-CM | POA: Diagnosis not present

## 2020-03-06 DIAGNOSIS — L509 Urticaria, unspecified: Secondary | ICD-10-CM | POA: Diagnosis not present

## 2020-03-06 DIAGNOSIS — G43709 Chronic migraine without aura, not intractable, without status migrainosus: Secondary | ICD-10-CM | POA: Diagnosis not present

## 2020-03-06 DIAGNOSIS — D3131 Benign neoplasm of right choroid: Secondary | ICD-10-CM | POA: Diagnosis not present

## 2020-03-06 DIAGNOSIS — S76211D Strain of adductor muscle, fascia and tendon of right thigh, subsequent encounter: Secondary | ICD-10-CM | POA: Diagnosis not present

## 2020-03-06 DIAGNOSIS — R944 Abnormal results of kidney function studies: Secondary | ICD-10-CM | POA: Diagnosis not present

## 2020-03-06 DIAGNOSIS — S46011D Strain of muscle(s) and tendon(s) of the rotator cuff of right shoulder, subsequent encounter: Secondary | ICD-10-CM | POA: Diagnosis not present

## 2020-03-06 DIAGNOSIS — I7410 Embolism and thrombosis of unspecified parts of aorta: Secondary | ICD-10-CM | POA: Diagnosis not present

## 2020-03-06 DIAGNOSIS — R4189 Other symptoms and signs involving cognitive functions and awareness: Secondary | ICD-10-CM | POA: Diagnosis not present

## 2020-03-06 DIAGNOSIS — M5416 Radiculopathy, lumbar region: Secondary | ICD-10-CM | POA: Diagnosis not present

## 2020-03-06 DIAGNOSIS — M955 Acquired deformity of pelvis: Secondary | ICD-10-CM | POA: Diagnosis not present

## 2020-03-06 DIAGNOSIS — D492 Neoplasm of unspecified behavior of bone, soft tissue, and skin: Secondary | ICD-10-CM | POA: Diagnosis not present

## 2020-03-06 DIAGNOSIS — M25662 Stiffness of left knee, not elsewhere classified: Secondary | ICD-10-CM | POA: Diagnosis not present

## 2020-03-06 DIAGNOSIS — Z7984 Long term (current) use of oral hypoglycemic drugs: Secondary | ICD-10-CM | POA: Diagnosis not present

## 2020-03-06 DIAGNOSIS — F5101 Primary insomnia: Secondary | ICD-10-CM | POA: Diagnosis not present

## 2020-03-06 DIAGNOSIS — R42 Dizziness and giddiness: Secondary | ICD-10-CM | POA: Diagnosis not present

## 2020-03-06 DIAGNOSIS — Z48298 Encounter for aftercare following other organ transplant: Secondary | ICD-10-CM | POA: Diagnosis not present

## 2020-03-06 DIAGNOSIS — N6452 Nipple discharge: Secondary | ICD-10-CM | POA: Diagnosis not present

## 2020-03-06 DIAGNOSIS — C538 Malignant neoplasm of overlapping sites of cervix uteri: Secondary | ICD-10-CM | POA: Diagnosis not present

## 2020-03-06 DIAGNOSIS — R82998 Other abnormal findings in urine: Secondary | ICD-10-CM | POA: Diagnosis not present

## 2020-03-06 DIAGNOSIS — E1169 Type 2 diabetes mellitus with other specified complication: Secondary | ICD-10-CM | POA: Diagnosis not present

## 2020-03-06 DIAGNOSIS — R569 Unspecified convulsions: Secondary | ICD-10-CM | POA: Diagnosis not present

## 2020-03-06 DIAGNOSIS — M21162 Varus deformity, not elsewhere classified, left knee: Secondary | ICD-10-CM | POA: Diagnosis not present

## 2020-03-06 DIAGNOSIS — M9904 Segmental and somatic dysfunction of sacral region: Secondary | ICD-10-CM | POA: Diagnosis not present

## 2020-03-06 DIAGNOSIS — M23303 Other meniscus derangements, unspecified medial meniscus, right knee: Secondary | ICD-10-CM | POA: Diagnosis not present

## 2020-03-06 DIAGNOSIS — Z1211 Encounter for screening for malignant neoplasm of colon: Secondary | ICD-10-CM | POA: Diagnosis not present

## 2020-03-06 DIAGNOSIS — M79644 Pain in right finger(s): Secondary | ICD-10-CM | POA: Diagnosis not present

## 2020-03-06 DIAGNOSIS — R030 Elevated blood-pressure reading, without diagnosis of hypertension: Secondary | ICD-10-CM | POA: Diagnosis not present

## 2020-03-06 DIAGNOSIS — I7774 Dissection of vertebral artery: Secondary | ICD-10-CM | POA: Diagnosis not present

## 2020-03-06 DIAGNOSIS — Q211 Atrial septal defect: Secondary | ICD-10-CM | POA: Diagnosis not present

## 2020-03-06 DIAGNOSIS — N185 Chronic kidney disease, stage 5: Secondary | ICD-10-CM | POA: Diagnosis not present

## 2020-03-06 DIAGNOSIS — H179 Unspecified corneal scar and opacity: Secondary | ICD-10-CM | POA: Diagnosis not present

## 2020-03-06 DIAGNOSIS — M62551 Muscle wasting and atrophy, not elsewhere classified, right thigh: Secondary | ICD-10-CM | POA: Diagnosis not present

## 2020-03-06 DIAGNOSIS — Z954 Presence of other heart-valve replacement: Secondary | ICD-10-CM | POA: Diagnosis not present

## 2020-03-06 DIAGNOSIS — Z79891 Long term (current) use of opiate analgesic: Secondary | ICD-10-CM | POA: Diagnosis not present

## 2020-03-06 DIAGNOSIS — M79643 Pain in unspecified hand: Secondary | ICD-10-CM | POA: Diagnosis not present

## 2020-03-06 DIAGNOSIS — I70212 Atherosclerosis of native arteries of extremities with intermittent claudication, left leg: Secondary | ICD-10-CM | POA: Diagnosis not present

## 2020-03-06 DIAGNOSIS — D518 Other vitamin B12 deficiency anemias: Secondary | ICD-10-CM | POA: Diagnosis not present

## 2020-03-06 DIAGNOSIS — I129 Hypertensive chronic kidney disease with stage 1 through stage 4 chronic kidney disease, or unspecified chronic kidney disease: Secondary | ICD-10-CM | POA: Diagnosis not present

## 2020-03-06 DIAGNOSIS — B029 Zoster without complications: Secondary | ICD-10-CM | POA: Diagnosis not present

## 2020-03-06 DIAGNOSIS — N390 Urinary tract infection, site not specified: Secondary | ICD-10-CM | POA: Diagnosis not present

## 2020-03-06 DIAGNOSIS — C3411 Malignant neoplasm of upper lobe, right bronchus or lung: Secondary | ICD-10-CM | POA: Diagnosis not present

## 2020-03-06 DIAGNOSIS — N159 Renal tubulo-interstitial disease, unspecified: Secondary | ICD-10-CM | POA: Diagnosis not present

## 2020-03-06 DIAGNOSIS — M47814 Spondylosis without myelopathy or radiculopathy, thoracic region: Secondary | ICD-10-CM | POA: Diagnosis not present

## 2020-03-06 DIAGNOSIS — Z85828 Personal history of other malignant neoplasm of skin: Secondary | ICD-10-CM | POA: Diagnosis not present

## 2020-03-06 DIAGNOSIS — D696 Thrombocytopenia, unspecified: Secondary | ICD-10-CM | POA: Diagnosis not present

## 2020-03-06 DIAGNOSIS — M25542 Pain in joints of left hand: Secondary | ICD-10-CM | POA: Diagnosis not present

## 2020-03-06 DIAGNOSIS — I447 Left bundle-branch block, unspecified: Secondary | ICD-10-CM | POA: Diagnosis not present

## 2020-03-06 DIAGNOSIS — Q600 Renal agenesis, unilateral: Secondary | ICD-10-CM | POA: Diagnosis not present

## 2020-03-06 DIAGNOSIS — E870 Hyperosmolality and hypernatremia: Secondary | ICD-10-CM | POA: Diagnosis not present

## 2020-03-06 DIAGNOSIS — K648 Other hemorrhoids: Secondary | ICD-10-CM | POA: Diagnosis not present

## 2020-03-06 DIAGNOSIS — R2689 Other abnormalities of gait and mobility: Secondary | ICD-10-CM | POA: Diagnosis not present

## 2020-03-06 DIAGNOSIS — Z03818 Encounter for observation for suspected exposure to other biological agents ruled out: Secondary | ICD-10-CM | POA: Diagnosis not present

## 2020-03-06 DIAGNOSIS — I4581 Long QT syndrome: Secondary | ICD-10-CM | POA: Diagnosis not present

## 2020-03-06 DIAGNOSIS — L03115 Cellulitis of right lower limb: Secondary | ICD-10-CM | POA: Diagnosis not present

## 2020-03-06 DIAGNOSIS — S22050D Wedge compression fracture of T5-T6 vertebra, subsequent encounter for fracture with routine healing: Secondary | ICD-10-CM | POA: Diagnosis not present

## 2020-03-06 DIAGNOSIS — Z932 Ileostomy status: Secondary | ICD-10-CM | POA: Diagnosis not present

## 2020-03-06 DIAGNOSIS — N3946 Mixed incontinence: Secondary | ICD-10-CM | POA: Diagnosis not present

## 2020-03-06 DIAGNOSIS — W19XXXS Unspecified fall, sequela: Secondary | ICD-10-CM | POA: Diagnosis not present

## 2020-03-06 DIAGNOSIS — Z86000 Personal history of in-situ neoplasm of breast: Secondary | ICD-10-CM | POA: Diagnosis not present

## 2020-03-06 DIAGNOSIS — C50412 Malignant neoplasm of upper-outer quadrant of left female breast: Secondary | ICD-10-CM | POA: Diagnosis not present

## 2020-03-06 DIAGNOSIS — C642 Malignant neoplasm of left kidney, except renal pelvis: Secondary | ICD-10-CM | POA: Diagnosis not present

## 2020-03-06 DIAGNOSIS — R351 Nocturia: Secondary | ICD-10-CM | POA: Diagnosis not present

## 2020-03-06 DIAGNOSIS — M25411 Effusion, right shoulder: Secondary | ICD-10-CM | POA: Diagnosis not present

## 2020-03-06 DIAGNOSIS — R0902 Hypoxemia: Secondary | ICD-10-CM | POA: Diagnosis not present

## 2020-03-06 DIAGNOSIS — C3490 Malignant neoplasm of unspecified part of unspecified bronchus or lung: Secondary | ICD-10-CM | POA: Diagnosis not present

## 2020-03-06 DIAGNOSIS — C44619 Basal cell carcinoma of skin of left upper limb, including shoulder: Secondary | ICD-10-CM | POA: Diagnosis not present

## 2020-03-06 DIAGNOSIS — Z5321 Procedure and treatment not carried out due to patient leaving prior to being seen by health care provider: Secondary | ICD-10-CM | POA: Diagnosis not present

## 2020-03-06 DIAGNOSIS — M19011 Primary osteoarthritis, right shoulder: Secondary | ICD-10-CM | POA: Diagnosis not present

## 2020-03-06 DIAGNOSIS — M130 Polyarthritis, unspecified: Secondary | ICD-10-CM | POA: Diagnosis not present

## 2020-03-06 DIAGNOSIS — Q828 Other specified congenital malformations of skin: Secondary | ICD-10-CM | POA: Diagnosis not present

## 2020-03-06 DIAGNOSIS — H59031 Cystoid macular edema following cataract surgery, right eye: Secondary | ICD-10-CM | POA: Diagnosis not present

## 2020-03-06 DIAGNOSIS — M1611 Unilateral primary osteoarthritis, right hip: Secondary | ICD-10-CM | POA: Diagnosis not present

## 2020-03-06 DIAGNOSIS — H40051 Ocular hypertension, right eye: Secondary | ICD-10-CM | POA: Diagnosis not present

## 2020-03-06 DIAGNOSIS — I639 Cerebral infarction, unspecified: Secondary | ICD-10-CM | POA: Diagnosis not present

## 2020-03-06 DIAGNOSIS — S46012A Strain of muscle(s) and tendon(s) of the rotator cuff of left shoulder, initial encounter: Secondary | ICD-10-CM | POA: Diagnosis not present

## 2020-03-06 DIAGNOSIS — E349 Endocrine disorder, unspecified: Secondary | ICD-10-CM | POA: Diagnosis not present

## 2020-03-06 DIAGNOSIS — I809 Phlebitis and thrombophlebitis of unspecified site: Secondary | ICD-10-CM | POA: Diagnosis not present

## 2020-03-06 DIAGNOSIS — H43823 Vitreomacular adhesion, bilateral: Secondary | ICD-10-CM | POA: Diagnosis not present

## 2020-03-06 DIAGNOSIS — R918 Other nonspecific abnormal finding of lung field: Secondary | ICD-10-CM | POA: Diagnosis not present

## 2020-03-06 DIAGNOSIS — F17290 Nicotine dependence, other tobacco product, uncomplicated: Secondary | ICD-10-CM | POA: Diagnosis not present

## 2020-03-06 DIAGNOSIS — M7062 Trochanteric bursitis, left hip: Secondary | ICD-10-CM | POA: Diagnosis not present

## 2020-03-06 DIAGNOSIS — K529 Noninfective gastroenteritis and colitis, unspecified: Secondary | ICD-10-CM | POA: Diagnosis not present

## 2020-03-06 DIAGNOSIS — C8338 Diffuse large B-cell lymphoma, lymph nodes of multiple sites: Secondary | ICD-10-CM | POA: Diagnosis not present

## 2020-03-06 DIAGNOSIS — J849 Interstitial pulmonary disease, unspecified: Secondary | ICD-10-CM | POA: Diagnosis not present

## 2020-03-06 DIAGNOSIS — H4042X3 Glaucoma secondary to eye inflammation, left eye, severe stage: Secondary | ICD-10-CM | POA: Diagnosis not present

## 2020-03-06 DIAGNOSIS — L400 Psoriasis vulgaris: Secondary | ICD-10-CM | POA: Diagnosis not present

## 2020-03-06 DIAGNOSIS — M7582 Other shoulder lesions, left shoulder: Secondary | ICD-10-CM | POA: Diagnosis not present

## 2020-03-06 DIAGNOSIS — S91011A Laceration without foreign body, right ankle, initial encounter: Secondary | ICD-10-CM | POA: Diagnosis not present

## 2020-03-06 DIAGNOSIS — J3081 Allergic rhinitis due to animal (cat) (dog) hair and dander: Secondary | ICD-10-CM | POA: Diagnosis not present

## 2020-03-06 DIAGNOSIS — Z7951 Long term (current) use of inhaled steroids: Secondary | ICD-10-CM | POA: Diagnosis not present

## 2020-03-06 DIAGNOSIS — L218 Other seborrheic dermatitis: Secondary | ICD-10-CM | POA: Diagnosis not present

## 2020-03-06 DIAGNOSIS — Z7689 Persons encountering health services in other specified circumstances: Secondary | ICD-10-CM | POA: Diagnosis not present

## 2020-03-06 DIAGNOSIS — M2062 Acquired deformities of toe(s), unspecified, left foot: Secondary | ICD-10-CM | POA: Diagnosis not present

## 2020-03-06 DIAGNOSIS — F0391 Unspecified dementia with behavioral disturbance: Secondary | ICD-10-CM | POA: Diagnosis not present

## 2020-03-06 DIAGNOSIS — I5042 Chronic combined systolic (congestive) and diastolic (congestive) heart failure: Secondary | ICD-10-CM | POA: Diagnosis not present

## 2020-03-06 DIAGNOSIS — Z01818 Encounter for other preprocedural examination: Secondary | ICD-10-CM | POA: Diagnosis not present

## 2020-03-06 DIAGNOSIS — H35721 Serous detachment of retinal pigment epithelium, right eye: Secondary | ICD-10-CM | POA: Diagnosis not present

## 2020-03-06 DIAGNOSIS — J986 Disorders of diaphragm: Secondary | ICD-10-CM | POA: Diagnosis not present

## 2020-03-06 DIAGNOSIS — Z9181 History of falling: Secondary | ICD-10-CM | POA: Diagnosis not present

## 2020-03-06 DIAGNOSIS — G5712 Meralgia paresthetica, left lower limb: Secondary | ICD-10-CM | POA: Diagnosis not present

## 2020-03-06 DIAGNOSIS — Z13820 Encounter for screening for osteoporosis: Secondary | ICD-10-CM | POA: Diagnosis not present

## 2020-03-06 DIAGNOSIS — Z720 Tobacco use: Secondary | ICD-10-CM | POA: Diagnosis not present

## 2020-03-06 DIAGNOSIS — F324 Major depressive disorder, single episode, in partial remission: Secondary | ICD-10-CM | POA: Diagnosis not present

## 2020-03-06 DIAGNOSIS — M85831 Other specified disorders of bone density and structure, right forearm: Secondary | ICD-10-CM | POA: Diagnosis not present

## 2020-03-06 DIAGNOSIS — R922 Inconclusive mammogram: Secondary | ICD-10-CM | POA: Diagnosis not present

## 2020-03-06 DIAGNOSIS — R160 Hepatomegaly, not elsewhere classified: Secondary | ICD-10-CM | POA: Diagnosis not present

## 2020-03-06 DIAGNOSIS — I723 Aneurysm of iliac artery: Secondary | ICD-10-CM | POA: Diagnosis not present

## 2020-03-06 DIAGNOSIS — M150 Primary generalized (osteo)arthritis: Secondary | ICD-10-CM | POA: Diagnosis not present

## 2020-03-06 DIAGNOSIS — M8949 Other hypertrophic osteoarthropathy, multiple sites: Secondary | ICD-10-CM | POA: Diagnosis not present

## 2020-03-06 DIAGNOSIS — M94211 Chondromalacia, right shoulder: Secondary | ICD-10-CM | POA: Diagnosis not present

## 2020-03-06 DIAGNOSIS — M75111 Incomplete rotator cuff tear or rupture of right shoulder, not specified as traumatic: Secondary | ICD-10-CM | POA: Diagnosis not present

## 2020-03-06 DIAGNOSIS — H16223 Keratoconjunctivitis sicca, not specified as Sjogren's, bilateral: Secondary | ICD-10-CM | POA: Diagnosis not present

## 2020-03-06 DIAGNOSIS — C7989 Secondary malignant neoplasm of other specified sites: Secondary | ICD-10-CM | POA: Diagnosis not present

## 2020-03-06 DIAGNOSIS — E113293 Type 2 diabetes mellitus with mild nonproliferative diabetic retinopathy without macular edema, bilateral: Secondary | ICD-10-CM | POA: Diagnosis not present

## 2020-03-06 DIAGNOSIS — R200 Anesthesia of skin: Secondary | ICD-10-CM | POA: Diagnosis not present

## 2020-03-06 DIAGNOSIS — G232 Striatonigral degeneration: Secondary | ICD-10-CM | POA: Diagnosis not present

## 2020-03-06 DIAGNOSIS — Z85820 Personal history of malignant melanoma of skin: Secondary | ICD-10-CM | POA: Diagnosis not present

## 2020-03-06 DIAGNOSIS — N3090 Cystitis, unspecified without hematuria: Secondary | ICD-10-CM | POA: Diagnosis not present

## 2020-03-06 DIAGNOSIS — Z6823 Body mass index (BMI) 23.0-23.9, adult: Secondary | ICD-10-CM | POA: Diagnosis not present

## 2020-03-06 DIAGNOSIS — W19XXXA Unspecified fall, initial encounter: Secondary | ICD-10-CM | POA: Diagnosis not present

## 2020-03-06 DIAGNOSIS — C792 Secondary malignant neoplasm of skin: Secondary | ICD-10-CM | POA: Diagnosis not present

## 2020-03-06 DIAGNOSIS — C649 Malignant neoplasm of unspecified kidney, except renal pelvis: Secondary | ICD-10-CM | POA: Diagnosis not present

## 2020-03-06 DIAGNOSIS — M25612 Stiffness of left shoulder, not elsewhere classified: Secondary | ICD-10-CM | POA: Diagnosis not present

## 2020-03-06 DIAGNOSIS — R768 Other specified abnormal immunological findings in serum: Secondary | ICD-10-CM | POA: Diagnosis not present

## 2020-03-06 DIAGNOSIS — C155 Malignant neoplasm of lower third of esophagus: Secondary | ICD-10-CM | POA: Diagnosis not present

## 2020-03-06 DIAGNOSIS — S92401B Displaced unspecified fracture of right great toe, initial encounter for open fracture: Secondary | ICD-10-CM | POA: Diagnosis not present

## 2020-03-06 DIAGNOSIS — R112 Nausea with vomiting, unspecified: Secondary | ICD-10-CM | POA: Diagnosis not present

## 2020-03-06 DIAGNOSIS — E890 Postprocedural hypothyroidism: Secondary | ICD-10-CM | POA: Diagnosis not present

## 2020-03-06 DIAGNOSIS — Z7982 Long term (current) use of aspirin: Secondary | ICD-10-CM | POA: Diagnosis not present

## 2020-03-06 DIAGNOSIS — Z8619 Personal history of other infectious and parasitic diseases: Secondary | ICD-10-CM | POA: Diagnosis not present

## 2020-03-06 DIAGNOSIS — Z1331 Encounter for screening for depression: Secondary | ICD-10-CM | POA: Diagnosis not present

## 2020-03-06 DIAGNOSIS — C679 Malignant neoplasm of bladder, unspecified: Secondary | ICD-10-CM | POA: Diagnosis not present

## 2020-03-06 DIAGNOSIS — S97111A Crushing injury of right great toe, initial encounter: Secondary | ICD-10-CM | POA: Diagnosis not present

## 2020-03-06 DIAGNOSIS — Z5181 Encounter for therapeutic drug level monitoring: Secondary | ICD-10-CM | POA: Diagnosis not present

## 2020-03-06 DIAGNOSIS — Z823 Family history of stroke: Secondary | ICD-10-CM | POA: Diagnosis not present

## 2020-03-06 DIAGNOSIS — Z96611 Presence of right artificial shoulder joint: Secondary | ICD-10-CM | POA: Diagnosis not present

## 2020-03-06 DIAGNOSIS — H905 Unspecified sensorineural hearing loss: Secondary | ICD-10-CM | POA: Diagnosis not present

## 2020-03-06 DIAGNOSIS — S72401G Unspecified fracture of lower end of right femur, subsequent encounter for closed fracture with delayed healing: Secondary | ICD-10-CM | POA: Diagnosis not present

## 2020-03-06 DIAGNOSIS — I350 Nonrheumatic aortic (valve) stenosis: Secondary | ICD-10-CM | POA: Diagnosis not present

## 2020-03-06 DIAGNOSIS — E039 Hypothyroidism, unspecified: Secondary | ICD-10-CM | POA: Diagnosis not present

## 2020-03-06 DIAGNOSIS — G932 Benign intracranial hypertension: Secondary | ICD-10-CM | POA: Diagnosis not present

## 2020-03-06 DIAGNOSIS — J45991 Cough variant asthma: Secondary | ICD-10-CM | POA: Diagnosis not present

## 2020-03-06 DIAGNOSIS — M1991 Primary osteoarthritis, unspecified site: Secondary | ICD-10-CM | POA: Diagnosis not present

## 2020-03-06 DIAGNOSIS — L508 Other urticaria: Secondary | ICD-10-CM | POA: Diagnosis not present

## 2020-03-06 DIAGNOSIS — H4041X1 Glaucoma secondary to eye inflammation, right eye, mild stage: Secondary | ICD-10-CM | POA: Diagnosis not present

## 2020-03-06 DIAGNOSIS — I130 Hypertensive heart and chronic kidney disease with heart failure and stage 1 through stage 4 chronic kidney disease, or unspecified chronic kidney disease: Secondary | ICD-10-CM | POA: Diagnosis not present

## 2020-03-06 DIAGNOSIS — H9313 Tinnitus, bilateral: Secondary | ICD-10-CM | POA: Diagnosis not present

## 2020-03-06 DIAGNOSIS — G5603 Carpal tunnel syndrome, bilateral upper limbs: Secondary | ICD-10-CM | POA: Diagnosis not present

## 2020-03-06 DIAGNOSIS — M8000XS Age-related osteoporosis with current pathological fracture, unspecified site, sequela: Secondary | ICD-10-CM | POA: Diagnosis not present

## 2020-03-06 DIAGNOSIS — H35033 Hypertensive retinopathy, bilateral: Secondary | ICD-10-CM | POA: Diagnosis not present

## 2020-03-06 DIAGNOSIS — M2560 Stiffness of unspecified joint, not elsewhere classified: Secondary | ICD-10-CM | POA: Diagnosis not present

## 2020-03-06 DIAGNOSIS — I272 Pulmonary hypertension, unspecified: Secondary | ICD-10-CM | POA: Diagnosis not present

## 2020-03-06 DIAGNOSIS — G609 Hereditary and idiopathic neuropathy, unspecified: Secondary | ICD-10-CM | POA: Diagnosis not present

## 2020-03-06 DIAGNOSIS — M50021 Cervical disc disorder at C4-C5 level with myelopathy: Secondary | ICD-10-CM | POA: Diagnosis not present

## 2020-03-06 DIAGNOSIS — H6690 Otitis media, unspecified, unspecified ear: Secondary | ICD-10-CM | POA: Diagnosis not present

## 2020-03-06 DIAGNOSIS — R2681 Unsteadiness on feet: Secondary | ICD-10-CM | POA: Diagnosis not present

## 2020-03-06 DIAGNOSIS — I2692 Saddle embolus of pulmonary artery without acute cor pulmonale: Secondary | ICD-10-CM | POA: Diagnosis not present

## 2020-03-06 DIAGNOSIS — B001 Herpesviral vesicular dermatitis: Secondary | ICD-10-CM | POA: Diagnosis not present

## 2020-03-06 DIAGNOSIS — N3281 Overactive bladder: Secondary | ICD-10-CM | POA: Diagnosis not present

## 2020-03-06 DIAGNOSIS — C8580 Other specified types of non-Hodgkin lymphoma, unspecified site: Secondary | ICD-10-CM | POA: Diagnosis not present

## 2020-03-06 DIAGNOSIS — Z9119 Patient's noncompliance with other medical treatment and regimen: Secondary | ICD-10-CM | POA: Diagnosis not present

## 2020-03-06 DIAGNOSIS — C801 Malignant (primary) neoplasm, unspecified: Secondary | ICD-10-CM | POA: Diagnosis not present

## 2020-03-06 DIAGNOSIS — R319 Hematuria, unspecified: Secondary | ICD-10-CM | POA: Diagnosis not present

## 2020-03-06 DIAGNOSIS — R188 Other ascites: Secondary | ICD-10-CM | POA: Diagnosis not present

## 2020-03-06 DIAGNOSIS — D1723 Benign lipomatous neoplasm of skin and subcutaneous tissue of right leg: Secondary | ICD-10-CM | POA: Diagnosis not present

## 2020-03-06 DIAGNOSIS — F332 Major depressive disorder, recurrent severe without psychotic features: Secondary | ICD-10-CM | POA: Diagnosis not present

## 2020-03-06 DIAGNOSIS — M961 Postlaminectomy syndrome, not elsewhere classified: Secondary | ICD-10-CM | POA: Diagnosis not present

## 2020-03-06 DIAGNOSIS — Z20822 Contact with and (suspected) exposure to covid-19: Secondary | ICD-10-CM | POA: Diagnosis not present

## 2020-03-06 DIAGNOSIS — M5090 Cervical disc disorder, unspecified, unspecified cervical region: Secondary | ICD-10-CM | POA: Diagnosis not present

## 2020-03-06 DIAGNOSIS — G40109 Localization-related (focal) (partial) symptomatic epilepsy and epileptic syndromes with simple partial seizures, not intractable, without status epilepticus: Secondary | ICD-10-CM | POA: Diagnosis not present

## 2020-03-06 DIAGNOSIS — D126 Benign neoplasm of colon, unspecified: Secondary | ICD-10-CM | POA: Diagnosis not present

## 2020-03-06 DIAGNOSIS — M159 Polyosteoarthritis, unspecified: Secondary | ICD-10-CM | POA: Diagnosis not present

## 2020-03-06 DIAGNOSIS — E8940 Asymptomatic postprocedural ovarian failure: Secondary | ICD-10-CM | POA: Diagnosis not present

## 2020-03-06 DIAGNOSIS — H1032 Unspecified acute conjunctivitis, left eye: Secondary | ICD-10-CM | POA: Diagnosis not present

## 2020-03-06 DIAGNOSIS — M546 Pain in thoracic spine: Secondary | ICD-10-CM | POA: Diagnosis not present

## 2020-03-06 DIAGNOSIS — M1811 Unilateral primary osteoarthritis of first carpometacarpal joint, right hand: Secondary | ICD-10-CM | POA: Diagnosis not present

## 2020-03-06 DIAGNOSIS — M3214 Glomerular disease in systemic lupus erythematosus: Secondary | ICD-10-CM | POA: Diagnosis not present

## 2020-03-06 DIAGNOSIS — J431 Panlobular emphysema: Secondary | ICD-10-CM | POA: Diagnosis not present

## 2020-03-06 DIAGNOSIS — L409 Psoriasis, unspecified: Secondary | ICD-10-CM | POA: Diagnosis not present

## 2020-03-06 DIAGNOSIS — G40909 Epilepsy, unspecified, not intractable, without status epilepticus: Secondary | ICD-10-CM | POA: Diagnosis not present

## 2020-03-06 DIAGNOSIS — C772 Secondary and unspecified malignant neoplasm of intra-abdominal lymph nodes: Secondary | ICD-10-CM | POA: Diagnosis not present

## 2020-03-06 DIAGNOSIS — R6889 Other general symptoms and signs: Secondary | ICD-10-CM | POA: Diagnosis not present

## 2020-03-06 DIAGNOSIS — Z809 Family history of malignant neoplasm, unspecified: Secondary | ICD-10-CM | POA: Diagnosis not present

## 2020-03-06 DIAGNOSIS — M1990 Unspecified osteoarthritis, unspecified site: Secondary | ICD-10-CM | POA: Diagnosis not present

## 2020-03-06 DIAGNOSIS — B379 Candidiasis, unspecified: Secondary | ICD-10-CM | POA: Diagnosis not present

## 2020-03-06 DIAGNOSIS — I270 Primary pulmonary hypertension: Secondary | ICD-10-CM | POA: Diagnosis not present

## 2020-03-06 DIAGNOSIS — N889 Noninflammatory disorder of cervix uteri, unspecified: Secondary | ICD-10-CM | POA: Diagnosis not present

## 2020-03-06 DIAGNOSIS — M5010 Cervical disc disorder with radiculopathy, unspecified cervical region: Secondary | ICD-10-CM | POA: Diagnosis not present

## 2020-03-06 DIAGNOSIS — Z9889 Other specified postprocedural states: Secondary | ICD-10-CM | POA: Diagnosis not present

## 2020-03-06 DIAGNOSIS — Z8639 Personal history of other endocrine, nutritional and metabolic disease: Secondary | ICD-10-CM | POA: Diagnosis not present

## 2020-03-06 DIAGNOSIS — L97322 Non-pressure chronic ulcer of left ankle with fat layer exposed: Secondary | ICD-10-CM | POA: Diagnosis not present

## 2020-03-06 DIAGNOSIS — M79661 Pain in right lower leg: Secondary | ICD-10-CM | POA: Diagnosis not present

## 2020-03-06 DIAGNOSIS — Z96652 Presence of left artificial knee joint: Secondary | ICD-10-CM | POA: Diagnosis not present

## 2020-03-06 DIAGNOSIS — N179 Acute kidney failure, unspecified: Secondary | ICD-10-CM | POA: Diagnosis not present

## 2020-03-06 DIAGNOSIS — G20 Parkinson's disease: Secondary | ICD-10-CM | POA: Diagnosis not present

## 2020-03-06 DIAGNOSIS — I5032 Chronic diastolic (congestive) heart failure: Secondary | ICD-10-CM | POA: Diagnosis not present

## 2020-03-06 DIAGNOSIS — H2511 Age-related nuclear cataract, right eye: Secondary | ICD-10-CM | POA: Diagnosis not present

## 2020-03-06 DIAGNOSIS — H9191 Unspecified hearing loss, right ear: Secondary | ICD-10-CM | POA: Diagnosis not present

## 2020-03-06 DIAGNOSIS — J0191 Acute recurrent sinusitis, unspecified: Secondary | ICD-10-CM | POA: Diagnosis not present

## 2020-03-06 DIAGNOSIS — R001 Bradycardia, unspecified: Secondary | ICD-10-CM | POA: Diagnosis not present

## 2020-03-06 DIAGNOSIS — I441 Atrioventricular block, second degree: Secondary | ICD-10-CM | POA: Diagnosis not present

## 2020-03-06 DIAGNOSIS — K5010 Crohn's disease of large intestine without complications: Secondary | ICD-10-CM | POA: Diagnosis not present

## 2020-03-06 DIAGNOSIS — R350 Frequency of micturition: Secondary | ICD-10-CM | POA: Diagnosis not present

## 2020-03-06 DIAGNOSIS — K7400 Hepatic fibrosis, unspecified: Secondary | ICD-10-CM | POA: Diagnosis not present

## 2020-03-06 DIAGNOSIS — K5790 Diverticulosis of intestine, part unspecified, without perforation or abscess without bleeding: Secondary | ICD-10-CM | POA: Diagnosis not present

## 2020-03-06 DIAGNOSIS — H3562 Retinal hemorrhage, left eye: Secondary | ICD-10-CM | POA: Diagnosis not present

## 2020-03-06 DIAGNOSIS — H9193 Unspecified hearing loss, bilateral: Secondary | ICD-10-CM | POA: Diagnosis not present

## 2020-03-06 DIAGNOSIS — M79609 Pain in unspecified limb: Secondary | ICD-10-CM | POA: Diagnosis not present

## 2020-03-06 DIAGNOSIS — M109 Gout, unspecified: Secondary | ICD-10-CM | POA: Diagnosis not present

## 2020-03-06 DIAGNOSIS — E113412 Type 2 diabetes mellitus with severe nonproliferative diabetic retinopathy with macular edema, left eye: Secondary | ICD-10-CM | POA: Diagnosis not present

## 2020-03-06 DIAGNOSIS — G92 Toxic encephalopathy: Secondary | ICD-10-CM | POA: Diagnosis not present

## 2020-03-06 DIAGNOSIS — M19012 Primary osteoarthritis, left shoulder: Secondary | ICD-10-CM | POA: Diagnosis not present

## 2020-03-06 DIAGNOSIS — Z6836 Body mass index (BMI) 36.0-36.9, adult: Secondary | ICD-10-CM | POA: Diagnosis not present

## 2020-03-06 DIAGNOSIS — C61 Malignant neoplasm of prostate: Secondary | ICD-10-CM | POA: Diagnosis not present

## 2020-03-06 DIAGNOSIS — I83813 Varicose veins of bilateral lower extremities with pain: Secondary | ICD-10-CM | POA: Diagnosis not present

## 2020-03-06 DIAGNOSIS — Z9289 Personal history of other medical treatment: Secondary | ICD-10-CM | POA: Diagnosis not present

## 2020-03-06 DIAGNOSIS — R9439 Abnormal result of other cardiovascular function study: Secondary | ICD-10-CM | POA: Diagnosis not present

## 2020-03-06 DIAGNOSIS — B078 Other viral warts: Secondary | ICD-10-CM | POA: Diagnosis not present

## 2020-03-06 DIAGNOSIS — E118 Type 2 diabetes mellitus with unspecified complications: Secondary | ICD-10-CM | POA: Diagnosis not present

## 2020-03-06 DIAGNOSIS — H25813 Combined forms of age-related cataract, bilateral: Secondary | ICD-10-CM | POA: Diagnosis not present

## 2020-03-06 DIAGNOSIS — E1122 Type 2 diabetes mellitus with diabetic chronic kidney disease: Secondary | ICD-10-CM | POA: Diagnosis not present

## 2020-03-06 DIAGNOSIS — Z124 Encounter for screening for malignant neoplasm of cervix: Secondary | ICD-10-CM | POA: Diagnosis not present

## 2020-03-06 DIAGNOSIS — I87333 Chronic venous hypertension (idiopathic) with ulcer and inflammation of bilateral lower extremity: Secondary | ICD-10-CM | POA: Diagnosis not present

## 2020-03-06 DIAGNOSIS — I4821 Permanent atrial fibrillation: Secondary | ICD-10-CM | POA: Diagnosis not present

## 2020-03-06 DIAGNOSIS — H353132 Nonexudative age-related macular degeneration, bilateral, intermediate dry stage: Secondary | ICD-10-CM | POA: Diagnosis not present

## 2020-03-06 DIAGNOSIS — R933 Abnormal findings on diagnostic imaging of other parts of digestive tract: Secondary | ICD-10-CM | POA: Diagnosis not present

## 2020-03-06 DIAGNOSIS — D72821 Monocytosis (symptomatic): Secondary | ICD-10-CM | POA: Diagnosis not present

## 2020-03-06 DIAGNOSIS — R519 Headache, unspecified: Secondary | ICD-10-CM | POA: Diagnosis not present

## 2020-03-06 DIAGNOSIS — F3341 Major depressive disorder, recurrent, in partial remission: Secondary | ICD-10-CM | POA: Diagnosis not present

## 2020-03-06 DIAGNOSIS — E1143 Type 2 diabetes mellitus with diabetic autonomic (poly)neuropathy: Secondary | ICD-10-CM | POA: Diagnosis not present

## 2020-03-06 DIAGNOSIS — D0439 Carcinoma in situ of skin of other parts of face: Secondary | ICD-10-CM | POA: Diagnosis not present

## 2020-03-06 DIAGNOSIS — B1910 Unspecified viral hepatitis B without hepatic coma: Secondary | ICD-10-CM | POA: Diagnosis not present

## 2020-03-06 DIAGNOSIS — M359 Systemic involvement of connective tissue, unspecified: Secondary | ICD-10-CM | POA: Diagnosis not present

## 2020-03-06 DIAGNOSIS — J440 Chronic obstructive pulmonary disease with acute lower respiratory infection: Secondary | ICD-10-CM | POA: Diagnosis not present

## 2020-03-06 DIAGNOSIS — Z8661 Personal history of infections of the central nervous system: Secondary | ICD-10-CM | POA: Diagnosis not present

## 2020-03-06 DIAGNOSIS — M9901 Segmental and somatic dysfunction of cervical region: Secondary | ICD-10-CM | POA: Diagnosis not present

## 2020-03-06 DIAGNOSIS — Z993 Dependence on wheelchair: Secondary | ICD-10-CM | POA: Diagnosis not present

## 2020-03-06 DIAGNOSIS — N644 Mastodynia: Secondary | ICD-10-CM | POA: Diagnosis not present

## 2020-03-06 DIAGNOSIS — D2111 Benign neoplasm of connective and other soft tissue of right upper limb, including shoulder: Secondary | ICD-10-CM | POA: Diagnosis not present

## 2020-03-06 DIAGNOSIS — C8339 Diffuse large B-cell lymphoma, extranodal and solid organ sites: Secondary | ICD-10-CM | POA: Diagnosis not present

## 2020-03-06 DIAGNOSIS — R1310 Dysphagia, unspecified: Secondary | ICD-10-CM | POA: Diagnosis not present

## 2020-03-06 DIAGNOSIS — Z13220 Encounter for screening for lipoid disorders: Secondary | ICD-10-CM | POA: Diagnosis not present

## 2020-03-06 DIAGNOSIS — N959 Unspecified menopausal and perimenopausal disorder: Secondary | ICD-10-CM | POA: Diagnosis not present

## 2020-03-06 DIAGNOSIS — Z8616 Personal history of COVID-19: Secondary | ICD-10-CM | POA: Diagnosis not present

## 2020-03-06 DIAGNOSIS — Z862 Personal history of diseases of the blood and blood-forming organs and certain disorders involving the immune mechanism: Secondary | ICD-10-CM | POA: Diagnosis not present

## 2020-03-06 DIAGNOSIS — R32 Unspecified urinary incontinence: Secondary | ICD-10-CM | POA: Diagnosis not present

## 2020-03-06 DIAGNOSIS — D46A Refractory cytopenia with multilineage dysplasia: Secondary | ICD-10-CM | POA: Diagnosis not present

## 2020-03-06 DIAGNOSIS — J0120 Acute ethmoidal sinusitis, unspecified: Secondary | ICD-10-CM | POA: Diagnosis not present

## 2020-03-06 DIAGNOSIS — D62 Acute posthemorrhagic anemia: Secondary | ICD-10-CM | POA: Diagnosis not present

## 2020-03-06 DIAGNOSIS — H65193 Other acute nonsuppurative otitis media, bilateral: Secondary | ICD-10-CM | POA: Diagnosis not present

## 2020-03-06 DIAGNOSIS — M5410 Radiculopathy, site unspecified: Secondary | ICD-10-CM | POA: Diagnosis not present

## 2020-03-06 DIAGNOSIS — M25512 Pain in left shoulder: Secondary | ICD-10-CM | POA: Diagnosis not present

## 2020-03-06 DIAGNOSIS — K2950 Unspecified chronic gastritis without bleeding: Secondary | ICD-10-CM | POA: Diagnosis not present

## 2020-03-06 DIAGNOSIS — N182 Chronic kidney disease, stage 2 (mild): Secondary | ICD-10-CM | POA: Diagnosis not present

## 2020-03-06 DIAGNOSIS — G893 Neoplasm related pain (acute) (chronic): Secondary | ICD-10-CM | POA: Diagnosis not present

## 2020-03-06 DIAGNOSIS — K5901 Slow transit constipation: Secondary | ICD-10-CM | POA: Diagnosis not present

## 2020-03-06 DIAGNOSIS — I083 Combined rheumatic disorders of mitral, aortic and tricuspid valves: Secondary | ICD-10-CM | POA: Diagnosis not present

## 2020-03-06 DIAGNOSIS — N3941 Urge incontinence: Secondary | ICD-10-CM | POA: Diagnosis not present

## 2020-03-06 DIAGNOSIS — F5104 Psychophysiologic insomnia: Secondary | ICD-10-CM | POA: Diagnosis not present

## 2020-03-06 DIAGNOSIS — M75121 Complete rotator cuff tear or rupture of right shoulder, not specified as traumatic: Secondary | ICD-10-CM | POA: Diagnosis not present

## 2020-03-06 DIAGNOSIS — I498 Other specified cardiac arrhythmias: Secondary | ICD-10-CM | POA: Diagnosis not present

## 2020-03-06 DIAGNOSIS — L03011 Cellulitis of right finger: Secondary | ICD-10-CM | POA: Diagnosis not present

## 2020-03-06 DIAGNOSIS — I059 Rheumatic mitral valve disease, unspecified: Secondary | ICD-10-CM | POA: Diagnosis not present

## 2020-03-06 DIAGNOSIS — M75112 Incomplete rotator cuff tear or rupture of left shoulder, not specified as traumatic: Secondary | ICD-10-CM | POA: Diagnosis not present

## 2020-03-06 DIAGNOSIS — M25362 Other instability, left knee: Secondary | ICD-10-CM | POA: Diagnosis not present

## 2020-03-06 DIAGNOSIS — K2980 Duodenitis without bleeding: Secondary | ICD-10-CM | POA: Diagnosis not present

## 2020-03-06 DIAGNOSIS — N202 Calculus of kidney with calculus of ureter: Secondary | ICD-10-CM | POA: Diagnosis not present

## 2020-03-06 DIAGNOSIS — H401111 Primary open-angle glaucoma, right eye, mild stage: Secondary | ICD-10-CM | POA: Diagnosis not present

## 2020-03-06 DIAGNOSIS — J45909 Unspecified asthma, uncomplicated: Secondary | ICD-10-CM | POA: Diagnosis not present

## 2020-03-06 DIAGNOSIS — R0781 Pleurodynia: Secondary | ICD-10-CM | POA: Diagnosis not present

## 2020-03-06 DIAGNOSIS — L02411 Cutaneous abscess of right axilla: Secondary | ICD-10-CM | POA: Diagnosis not present

## 2020-03-06 DIAGNOSIS — H43813 Vitreous degeneration, bilateral: Secondary | ICD-10-CM | POA: Diagnosis not present

## 2020-03-06 DIAGNOSIS — Z483 Aftercare following surgery for neoplasm: Secondary | ICD-10-CM | POA: Diagnosis not present

## 2020-03-06 DIAGNOSIS — Z792 Long term (current) use of antibiotics: Secondary | ICD-10-CM | POA: Diagnosis not present

## 2020-03-06 DIAGNOSIS — E2839 Other primary ovarian failure: Secondary | ICD-10-CM | POA: Diagnosis not present

## 2020-03-06 DIAGNOSIS — M6281 Muscle weakness (generalized): Secondary | ICD-10-CM | POA: Diagnosis not present

## 2020-03-06 DIAGNOSIS — Z48813 Encounter for surgical aftercare following surgery on the respiratory system: Secondary | ICD-10-CM | POA: Diagnosis not present

## 2020-03-06 DIAGNOSIS — F5105 Insomnia due to other mental disorder: Secondary | ICD-10-CM | POA: Diagnosis not present

## 2020-03-06 DIAGNOSIS — L600 Ingrowing nail: Secondary | ICD-10-CM | POA: Diagnosis not present

## 2020-03-06 DIAGNOSIS — I420 Dilated cardiomyopathy: Secondary | ICD-10-CM | POA: Diagnosis not present

## 2020-03-06 DIAGNOSIS — C3492 Malignant neoplasm of unspecified part of left bronchus or lung: Secondary | ICD-10-CM | POA: Diagnosis not present

## 2020-03-06 DIAGNOSIS — C4442 Squamous cell carcinoma of skin of scalp and neck: Secondary | ICD-10-CM | POA: Diagnosis not present

## 2020-03-06 DIAGNOSIS — D509 Iron deficiency anemia, unspecified: Secondary | ICD-10-CM | POA: Diagnosis not present

## 2020-03-06 DIAGNOSIS — Z91030 Bee allergy status: Secondary | ICD-10-CM | POA: Diagnosis not present

## 2020-03-06 DIAGNOSIS — R7989 Other specified abnormal findings of blood chemistry: Secondary | ICD-10-CM | POA: Diagnosis not present

## 2020-03-06 DIAGNOSIS — G43909 Migraine, unspecified, not intractable, without status migrainosus: Secondary | ICD-10-CM | POA: Diagnosis not present

## 2020-03-06 DIAGNOSIS — S41101D Unspecified open wound of right upper arm, subsequent encounter: Secondary | ICD-10-CM | POA: Diagnosis not present

## 2020-03-06 DIAGNOSIS — M4186 Other forms of scoliosis, lumbar region: Secondary | ICD-10-CM | POA: Diagnosis not present

## 2020-03-06 DIAGNOSIS — N888 Other specified noninflammatory disorders of cervix uteri: Secondary | ICD-10-CM | POA: Diagnosis not present

## 2020-03-06 DIAGNOSIS — R935 Abnormal findings on diagnostic imaging of other abdominal regions, including retroperitoneum: Secondary | ICD-10-CM | POA: Diagnosis not present

## 2020-03-06 DIAGNOSIS — N401 Enlarged prostate with lower urinary tract symptoms: Secondary | ICD-10-CM | POA: Diagnosis not present

## 2020-03-06 DIAGNOSIS — L239 Allergic contact dermatitis, unspecified cause: Secondary | ICD-10-CM | POA: Diagnosis not present

## 2020-03-06 DIAGNOSIS — C50811 Malignant neoplasm of overlapping sites of right female breast: Secondary | ICD-10-CM | POA: Diagnosis not present

## 2020-03-06 DIAGNOSIS — M5137 Other intervertebral disc degeneration, lumbosacral region: Secondary | ICD-10-CM | POA: Diagnosis not present

## 2020-03-06 DIAGNOSIS — J40 Bronchitis, not specified as acute or chronic: Secondary | ICD-10-CM | POA: Diagnosis not present

## 2020-03-07 DIAGNOSIS — R1903 Right lower quadrant abdominal swelling, mass and lump: Secondary | ICD-10-CM | POA: Diagnosis not present

## 2020-03-07 DIAGNOSIS — K635 Polyp of colon: Secondary | ICD-10-CM | POA: Diagnosis not present

## 2020-03-07 DIAGNOSIS — M549 Dorsalgia, unspecified: Secondary | ICD-10-CM | POA: Diagnosis not present

## 2020-03-07 DIAGNOSIS — I454 Nonspecific intraventricular block: Secondary | ICD-10-CM | POA: Diagnosis not present

## 2020-03-07 DIAGNOSIS — M6281 Muscle weakness (generalized): Secondary | ICD-10-CM | POA: Diagnosis not present

## 2020-03-07 DIAGNOSIS — M4317 Spondylolisthesis, lumbosacral region: Secondary | ICD-10-CM | POA: Diagnosis not present

## 2020-03-07 DIAGNOSIS — C7951 Secondary malignant neoplasm of bone: Secondary | ICD-10-CM | POA: Diagnosis not present

## 2020-03-07 DIAGNOSIS — Z9989 Dependence on other enabling machines and devices: Secondary | ICD-10-CM | POA: Diagnosis not present

## 2020-03-07 DIAGNOSIS — R197 Diarrhea, unspecified: Secondary | ICD-10-CM | POA: Diagnosis not present

## 2020-03-07 DIAGNOSIS — R0602 Shortness of breath: Secondary | ICD-10-CM | POA: Diagnosis not present

## 2020-03-07 DIAGNOSIS — R7989 Other specified abnormal findings of blood chemistry: Secondary | ICD-10-CM | POA: Diagnosis not present

## 2020-03-07 DIAGNOSIS — S42352A Displaced comminuted fracture of shaft of humerus, left arm, initial encounter for closed fracture: Secondary | ICD-10-CM | POA: Diagnosis not present

## 2020-03-07 DIAGNOSIS — I719 Aortic aneurysm of unspecified site, without rupture: Secondary | ICD-10-CM | POA: Diagnosis not present

## 2020-03-07 DIAGNOSIS — I8510 Secondary esophageal varices without bleeding: Secondary | ICD-10-CM | POA: Diagnosis not present

## 2020-03-07 DIAGNOSIS — I69320 Aphasia following cerebral infarction: Secondary | ICD-10-CM | POA: Diagnosis not present

## 2020-03-07 DIAGNOSIS — M7551 Bursitis of right shoulder: Secondary | ICD-10-CM | POA: Diagnosis not present

## 2020-03-07 DIAGNOSIS — D692 Other nonthrombocytopenic purpura: Secondary | ICD-10-CM | POA: Diagnosis not present

## 2020-03-07 DIAGNOSIS — N3592 Unspecified urethral stricture, female: Secondary | ICD-10-CM | POA: Diagnosis not present

## 2020-03-07 DIAGNOSIS — J90 Pleural effusion, not elsewhere classified: Secondary | ICD-10-CM | POA: Diagnosis not present

## 2020-03-07 DIAGNOSIS — E538 Deficiency of other specified B group vitamins: Secondary | ICD-10-CM | POA: Diagnosis not present

## 2020-03-07 DIAGNOSIS — I69354 Hemiplegia and hemiparesis following cerebral infarction affecting left non-dominant side: Secondary | ICD-10-CM | POA: Diagnosis not present

## 2020-03-07 DIAGNOSIS — H15101 Unspecified episcleritis, right eye: Secondary | ICD-10-CM | POA: Diagnosis not present

## 2020-03-07 DIAGNOSIS — R928 Other abnormal and inconclusive findings on diagnostic imaging of breast: Secondary | ICD-10-CM | POA: Diagnosis not present

## 2020-03-07 DIAGNOSIS — S62609A Fracture of unspecified phalanx of unspecified finger, initial encounter for closed fracture: Secondary | ICD-10-CM | POA: Diagnosis not present

## 2020-03-07 DIAGNOSIS — Z91018 Allergy to other foods: Secondary | ICD-10-CM | POA: Diagnosis not present

## 2020-03-07 DIAGNOSIS — E1140 Type 2 diabetes mellitus with diabetic neuropathy, unspecified: Secondary | ICD-10-CM | POA: Diagnosis not present

## 2020-03-07 DIAGNOSIS — I2583 Coronary atherosclerosis due to lipid rich plaque: Secondary | ICD-10-CM | POA: Diagnosis not present

## 2020-03-07 DIAGNOSIS — K5730 Diverticulosis of large intestine without perforation or abscess without bleeding: Secondary | ICD-10-CM | POA: Diagnosis not present

## 2020-03-07 DIAGNOSIS — M19012 Primary osteoarthritis, left shoulder: Secondary | ICD-10-CM | POA: Diagnosis not present

## 2020-03-07 DIAGNOSIS — H903 Sensorineural hearing loss, bilateral: Secondary | ICD-10-CM | POA: Diagnosis not present

## 2020-03-07 DIAGNOSIS — H409 Unspecified glaucoma: Secondary | ICD-10-CM | POA: Diagnosis not present

## 2020-03-07 DIAGNOSIS — F902 Attention-deficit hyperactivity disorder, combined type: Secondary | ICD-10-CM | POA: Diagnosis not present

## 2020-03-07 DIAGNOSIS — F4323 Adjustment disorder with mixed anxiety and depressed mood: Secondary | ICD-10-CM | POA: Diagnosis not present

## 2020-03-07 DIAGNOSIS — J4550 Severe persistent asthma, uncomplicated: Secondary | ICD-10-CM | POA: Diagnosis not present

## 2020-03-07 DIAGNOSIS — M79673 Pain in unspecified foot: Secondary | ICD-10-CM | POA: Diagnosis not present

## 2020-03-07 DIAGNOSIS — R7303 Prediabetes: Secondary | ICD-10-CM | POA: Diagnosis not present

## 2020-03-07 DIAGNOSIS — M19071 Primary osteoarthritis, right ankle and foot: Secondary | ICD-10-CM | POA: Diagnosis not present

## 2020-03-07 DIAGNOSIS — F419 Anxiety disorder, unspecified: Secondary | ICD-10-CM | POA: Diagnosis not present

## 2020-03-07 DIAGNOSIS — M2142 Flat foot [pes planus] (acquired), left foot: Secondary | ICD-10-CM | POA: Diagnosis not present

## 2020-03-07 DIAGNOSIS — C801 Malignant (primary) neoplasm, unspecified: Secondary | ICD-10-CM | POA: Diagnosis not present

## 2020-03-07 DIAGNOSIS — N838 Other noninflammatory disorders of ovary, fallopian tube and broad ligament: Secondary | ICD-10-CM | POA: Diagnosis not present

## 2020-03-07 DIAGNOSIS — F0150 Vascular dementia without behavioral disturbance: Secondary | ICD-10-CM | POA: Diagnosis not present

## 2020-03-07 DIAGNOSIS — Z961 Presence of intraocular lens: Secondary | ICD-10-CM | POA: Diagnosis not present

## 2020-03-07 DIAGNOSIS — I4811 Longstanding persistent atrial fibrillation: Secondary | ICD-10-CM | POA: Diagnosis not present

## 2020-03-07 DIAGNOSIS — Z8614 Personal history of Methicillin resistant Staphylococcus aureus infection: Secondary | ICD-10-CM | POA: Diagnosis not present

## 2020-03-07 DIAGNOSIS — R29704 NIHSS score 4: Secondary | ICD-10-CM | POA: Diagnosis not present

## 2020-03-07 DIAGNOSIS — L300 Nummular dermatitis: Secondary | ICD-10-CM | POA: Diagnosis not present

## 2020-03-07 DIAGNOSIS — S0181XA Laceration without foreign body of other part of head, initial encounter: Secondary | ICD-10-CM | POA: Diagnosis not present

## 2020-03-07 DIAGNOSIS — I70219 Atherosclerosis of native arteries of extremities with intermittent claudication, unspecified extremity: Secondary | ICD-10-CM | POA: Diagnosis not present

## 2020-03-07 DIAGNOSIS — Z20828 Contact with and (suspected) exposure to other viral communicable diseases: Secondary | ICD-10-CM | POA: Diagnosis not present

## 2020-03-07 DIAGNOSIS — R636 Underweight: Secondary | ICD-10-CM | POA: Diagnosis not present

## 2020-03-07 DIAGNOSIS — K551 Chronic vascular disorders of intestine: Secondary | ICD-10-CM | POA: Diagnosis not present

## 2020-03-07 DIAGNOSIS — J84112 Idiopathic pulmonary fibrosis: Secondary | ICD-10-CM | POA: Diagnosis not present

## 2020-03-07 DIAGNOSIS — L97922 Non-pressure chronic ulcer of unspecified part of left lower leg with fat layer exposed: Secondary | ICD-10-CM | POA: Diagnosis not present

## 2020-03-07 DIAGNOSIS — E1042 Type 1 diabetes mellitus with diabetic polyneuropathy: Secondary | ICD-10-CM | POA: Diagnosis not present

## 2020-03-07 DIAGNOSIS — E559 Vitamin D deficiency, unspecified: Secondary | ICD-10-CM | POA: Diagnosis not present

## 2020-03-07 DIAGNOSIS — M25661 Stiffness of right knee, not elsewhere classified: Secondary | ICD-10-CM | POA: Diagnosis not present

## 2020-03-07 DIAGNOSIS — E1165 Type 2 diabetes mellitus with hyperglycemia: Secondary | ICD-10-CM | POA: Diagnosis not present

## 2020-03-07 DIAGNOSIS — H2511 Age-related nuclear cataract, right eye: Secondary | ICD-10-CM | POA: Diagnosis not present

## 2020-03-07 DIAGNOSIS — Z7902 Long term (current) use of antithrombotics/antiplatelets: Secondary | ICD-10-CM | POA: Diagnosis not present

## 2020-03-07 DIAGNOSIS — M4727 Other spondylosis with radiculopathy, lumbosacral region: Secondary | ICD-10-CM | POA: Diagnosis not present

## 2020-03-07 DIAGNOSIS — H35362 Drusen (degenerative) of macula, left eye: Secondary | ICD-10-CM | POA: Diagnosis not present

## 2020-03-07 DIAGNOSIS — R509 Fever, unspecified: Secondary | ICD-10-CM | POA: Diagnosis not present

## 2020-03-07 DIAGNOSIS — M5030 Other cervical disc degeneration, unspecified cervical region: Secondary | ICD-10-CM | POA: Diagnosis not present

## 2020-03-07 DIAGNOSIS — I722 Aneurysm of renal artery: Secondary | ICD-10-CM | POA: Diagnosis not present

## 2020-03-07 DIAGNOSIS — H26491 Other secondary cataract, right eye: Secondary | ICD-10-CM | POA: Diagnosis not present

## 2020-03-07 DIAGNOSIS — R300 Dysuria: Secondary | ICD-10-CM | POA: Diagnosis not present

## 2020-03-07 DIAGNOSIS — S8264XD Nondisplaced fracture of lateral malleolus of right fibula, subsequent encounter for closed fracture with routine healing: Secondary | ICD-10-CM | POA: Diagnosis not present

## 2020-03-07 DIAGNOSIS — M2241 Chondromalacia patellae, right knee: Secondary | ICD-10-CM | POA: Diagnosis not present

## 2020-03-07 DIAGNOSIS — H25812 Combined forms of age-related cataract, left eye: Secondary | ICD-10-CM | POA: Diagnosis not present

## 2020-03-07 DIAGNOSIS — G43109 Migraine with aura, not intractable, without status migrainosus: Secondary | ICD-10-CM | POA: Diagnosis not present

## 2020-03-07 DIAGNOSIS — E1142 Type 2 diabetes mellitus with diabetic polyneuropathy: Secondary | ICD-10-CM | POA: Diagnosis not present

## 2020-03-07 DIAGNOSIS — T783XXD Angioneurotic edema, subsequent encounter: Secondary | ICD-10-CM | POA: Diagnosis not present

## 2020-03-07 DIAGNOSIS — E876 Hypokalemia: Secondary | ICD-10-CM | POA: Diagnosis not present

## 2020-03-07 DIAGNOSIS — I119 Hypertensive heart disease without heart failure: Secondary | ICD-10-CM | POA: Diagnosis not present

## 2020-03-07 DIAGNOSIS — B351 Tinea unguium: Secondary | ICD-10-CM | POA: Diagnosis not present

## 2020-03-07 DIAGNOSIS — X32XXXA Exposure to sunlight, initial encounter: Secondary | ICD-10-CM | POA: Diagnosis not present

## 2020-03-07 DIAGNOSIS — J9811 Atelectasis: Secondary | ICD-10-CM | POA: Diagnosis not present

## 2020-03-07 DIAGNOSIS — I8312 Varicose veins of left lower extremity with inflammation: Secondary | ICD-10-CM | POA: Diagnosis not present

## 2020-03-07 DIAGNOSIS — R1030 Lower abdominal pain, unspecified: Secondary | ICD-10-CM | POA: Diagnosis not present

## 2020-03-07 DIAGNOSIS — M25512 Pain in left shoulder: Secondary | ICD-10-CM | POA: Diagnosis not present

## 2020-03-07 DIAGNOSIS — L298 Other pruritus: Secondary | ICD-10-CM | POA: Diagnosis not present

## 2020-03-07 DIAGNOSIS — Z791 Long term (current) use of non-steroidal anti-inflammatories (NSAID): Secondary | ICD-10-CM | POA: Diagnosis not present

## 2020-03-07 DIAGNOSIS — C44712 Basal cell carcinoma of skin of right lower limb, including hip: Secondary | ICD-10-CM | POA: Diagnosis not present

## 2020-03-07 DIAGNOSIS — Z9049 Acquired absence of other specified parts of digestive tract: Secondary | ICD-10-CM | POA: Diagnosis not present

## 2020-03-07 DIAGNOSIS — M8588 Other specified disorders of bone density and structure, other site: Secondary | ICD-10-CM | POA: Diagnosis not present

## 2020-03-07 DIAGNOSIS — Z880 Allergy status to penicillin: Secondary | ICD-10-CM | POA: Diagnosis not present

## 2020-03-07 DIAGNOSIS — J8410 Pulmonary fibrosis, unspecified: Secondary | ICD-10-CM | POA: Diagnosis not present

## 2020-03-07 DIAGNOSIS — N400 Enlarged prostate without lower urinary tract symptoms: Secondary | ICD-10-CM | POA: Diagnosis not present

## 2020-03-07 DIAGNOSIS — G40901 Epilepsy, unspecified, not intractable, with status epilepticus: Secondary | ICD-10-CM | POA: Diagnosis not present

## 2020-03-07 DIAGNOSIS — M25551 Pain in right hip: Secondary | ICD-10-CM | POA: Diagnosis not present

## 2020-03-07 DIAGNOSIS — M4322 Fusion of spine, cervical region: Secondary | ICD-10-CM | POA: Diagnosis not present

## 2020-03-07 DIAGNOSIS — D45 Polycythemia vera: Secondary | ICD-10-CM | POA: Diagnosis not present

## 2020-03-07 DIAGNOSIS — R630 Anorexia: Secondary | ICD-10-CM | POA: Diagnosis not present

## 2020-03-07 DIAGNOSIS — F1110 Opioid abuse, uncomplicated: Secondary | ICD-10-CM | POA: Diagnosis not present

## 2020-03-07 DIAGNOSIS — R12 Heartburn: Secondary | ICD-10-CM | POA: Diagnosis not present

## 2020-03-07 DIAGNOSIS — S0993XA Unspecified injury of face, initial encounter: Secondary | ICD-10-CM | POA: Diagnosis not present

## 2020-03-07 DIAGNOSIS — H04301 Unspecified dacryocystitis of right lacrimal passage: Secondary | ICD-10-CM | POA: Diagnosis not present

## 2020-03-07 DIAGNOSIS — Z86018 Personal history of other benign neoplasm: Secondary | ICD-10-CM | POA: Diagnosis not present

## 2020-03-07 DIAGNOSIS — G500 Trigeminal neuralgia: Secondary | ICD-10-CM | POA: Diagnosis not present

## 2020-03-07 DIAGNOSIS — Z8616 Personal history of COVID-19: Secondary | ICD-10-CM | POA: Diagnosis not present

## 2020-03-07 DIAGNOSIS — I447 Left bundle-branch block, unspecified: Secondary | ICD-10-CM | POA: Diagnosis not present

## 2020-03-07 DIAGNOSIS — D1801 Hemangioma of skin and subcutaneous tissue: Secondary | ICD-10-CM | POA: Diagnosis not present

## 2020-03-07 DIAGNOSIS — L02512 Cutaneous abscess of left hand: Secondary | ICD-10-CM | POA: Diagnosis not present

## 2020-03-07 DIAGNOSIS — M25571 Pain in right ankle and joints of right foot: Secondary | ICD-10-CM | POA: Diagnosis not present

## 2020-03-07 DIAGNOSIS — M205X2 Other deformities of toe(s) (acquired), left foot: Secondary | ICD-10-CM | POA: Diagnosis not present

## 2020-03-07 DIAGNOSIS — D126 Benign neoplasm of colon, unspecified: Secondary | ICD-10-CM | POA: Diagnosis not present

## 2020-03-07 DIAGNOSIS — E875 Hyperkalemia: Secondary | ICD-10-CM | POA: Diagnosis not present

## 2020-03-07 DIAGNOSIS — E039 Hypothyroidism, unspecified: Secondary | ICD-10-CM | POA: Diagnosis not present

## 2020-03-07 DIAGNOSIS — D381 Neoplasm of uncertain behavior of trachea, bronchus and lung: Secondary | ICD-10-CM | POA: Diagnosis not present

## 2020-03-07 DIAGNOSIS — Z4789 Encounter for other orthopedic aftercare: Secondary | ICD-10-CM | POA: Diagnosis not present

## 2020-03-07 DIAGNOSIS — I441 Atrioventricular block, second degree: Secondary | ICD-10-CM | POA: Diagnosis not present

## 2020-03-07 DIAGNOSIS — M4804 Spinal stenosis, thoracic region: Secondary | ICD-10-CM | POA: Diagnosis not present

## 2020-03-07 DIAGNOSIS — M2560 Stiffness of unspecified joint, not elsewhere classified: Secondary | ICD-10-CM | POA: Diagnosis not present

## 2020-03-07 DIAGNOSIS — D225 Melanocytic nevi of trunk: Secondary | ICD-10-CM | POA: Diagnosis not present

## 2020-03-07 DIAGNOSIS — I2102 ST elevation (STEMI) myocardial infarction involving left anterior descending coronary artery: Secondary | ICD-10-CM | POA: Diagnosis not present

## 2020-03-07 DIAGNOSIS — I868 Varicose veins of other specified sites: Secondary | ICD-10-CM | POA: Diagnosis not present

## 2020-03-07 DIAGNOSIS — M79604 Pain in right leg: Secondary | ICD-10-CM | POA: Diagnosis not present

## 2020-03-07 DIAGNOSIS — C3490 Malignant neoplasm of unspecified part of unspecified bronchus or lung: Secondary | ICD-10-CM | POA: Diagnosis not present

## 2020-03-07 DIAGNOSIS — H906 Mixed conductive and sensorineural hearing loss, bilateral: Secondary | ICD-10-CM | POA: Diagnosis not present

## 2020-03-07 DIAGNOSIS — I340 Nonrheumatic mitral (valve) insufficiency: Secondary | ICD-10-CM | POA: Diagnosis not present

## 2020-03-07 DIAGNOSIS — Z9089 Acquired absence of other organs: Secondary | ICD-10-CM | POA: Diagnosis not present

## 2020-03-07 DIAGNOSIS — L03111 Cellulitis of right axilla: Secondary | ICD-10-CM | POA: Diagnosis not present

## 2020-03-07 DIAGNOSIS — N189 Chronic kidney disease, unspecified: Secondary | ICD-10-CM | POA: Diagnosis not present

## 2020-03-07 DIAGNOSIS — M25612 Stiffness of left shoulder, not elsewhere classified: Secondary | ICD-10-CM | POA: Diagnosis not present

## 2020-03-07 DIAGNOSIS — M4722 Other spondylosis with radiculopathy, cervical region: Secondary | ICD-10-CM | POA: Diagnosis not present

## 2020-03-07 DIAGNOSIS — M329 Systemic lupus erythematosus, unspecified: Secondary | ICD-10-CM | POA: Diagnosis not present

## 2020-03-07 DIAGNOSIS — L812 Freckles: Secondary | ICD-10-CM | POA: Diagnosis not present

## 2020-03-07 DIAGNOSIS — M879 Osteonecrosis, unspecified: Secondary | ICD-10-CM | POA: Diagnosis not present

## 2020-03-07 DIAGNOSIS — Z9641 Presence of insulin pump (external) (internal): Secondary | ICD-10-CM | POA: Diagnosis not present

## 2020-03-07 DIAGNOSIS — M48062 Spinal stenosis, lumbar region with neurogenic claudication: Secondary | ICD-10-CM | POA: Diagnosis not present

## 2020-03-07 DIAGNOSIS — R29702 NIHSS score 2: Secondary | ICD-10-CM | POA: Diagnosis not present

## 2020-03-07 DIAGNOSIS — I788 Other diseases of capillaries: Secondary | ICD-10-CM | POA: Diagnosis not present

## 2020-03-07 DIAGNOSIS — I714 Abdominal aortic aneurysm, without rupture: Secondary | ICD-10-CM | POA: Diagnosis not present

## 2020-03-07 DIAGNOSIS — I952 Hypotension due to drugs: Secondary | ICD-10-CM | POA: Diagnosis not present

## 2020-03-07 DIAGNOSIS — C44121 Squamous cell carcinoma of skin of unspecified eyelid, including canthus: Secondary | ICD-10-CM | POA: Diagnosis not present

## 2020-03-07 DIAGNOSIS — Z951 Presence of aortocoronary bypass graft: Secondary | ICD-10-CM | POA: Diagnosis not present

## 2020-03-07 DIAGNOSIS — K22719 Barrett's esophagus with dysplasia, unspecified: Secondary | ICD-10-CM | POA: Diagnosis not present

## 2020-03-07 DIAGNOSIS — I4892 Unspecified atrial flutter: Secondary | ICD-10-CM | POA: Diagnosis not present

## 2020-03-07 DIAGNOSIS — J069 Acute upper respiratory infection, unspecified: Secondary | ICD-10-CM | POA: Diagnosis not present

## 2020-03-07 DIAGNOSIS — D229 Melanocytic nevi, unspecified: Secondary | ICD-10-CM | POA: Diagnosis not present

## 2020-03-07 DIAGNOSIS — K824 Cholesterolosis of gallbladder: Secondary | ICD-10-CM | POA: Diagnosis not present

## 2020-03-07 DIAGNOSIS — N1832 Chronic kidney disease, stage 3b: Secondary | ICD-10-CM | POA: Diagnosis not present

## 2020-03-07 DIAGNOSIS — Q6102 Congenital multiple renal cysts: Secondary | ICD-10-CM | POA: Diagnosis not present

## 2020-03-07 DIAGNOSIS — A0472 Enterocolitis due to Clostridium difficile, not specified as recurrent: Secondary | ICD-10-CM | POA: Diagnosis not present

## 2020-03-07 DIAGNOSIS — Z905 Acquired absence of kidney: Secondary | ICD-10-CM | POA: Diagnosis not present

## 2020-03-07 DIAGNOSIS — M1A09X0 Idiopathic chronic gout, multiple sites, without tophus (tophi): Secondary | ICD-10-CM | POA: Diagnosis not present

## 2020-03-07 DIAGNOSIS — Z885 Allergy status to narcotic agent status: Secondary | ICD-10-CM | POA: Diagnosis not present

## 2020-03-07 DIAGNOSIS — H16212 Exposure keratoconjunctivitis, left eye: Secondary | ICD-10-CM | POA: Diagnosis not present

## 2020-03-07 DIAGNOSIS — H18232 Secondary corneal edema, left eye: Secondary | ICD-10-CM | POA: Diagnosis not present

## 2020-03-07 DIAGNOSIS — Z888 Allergy status to other drugs, medicaments and biological substances status: Secondary | ICD-10-CM | POA: Diagnosis not present

## 2020-03-07 DIAGNOSIS — I480 Paroxysmal atrial fibrillation: Secondary | ICD-10-CM | POA: Diagnosis not present

## 2020-03-07 DIAGNOSIS — I495 Sick sinus syndrome: Secondary | ICD-10-CM | POA: Diagnosis not present

## 2020-03-07 DIAGNOSIS — H53143 Visual discomfort, bilateral: Secondary | ICD-10-CM | POA: Diagnosis not present

## 2020-03-07 DIAGNOSIS — D539 Nutritional anemia, unspecified: Secondary | ICD-10-CM | POA: Diagnosis not present

## 2020-03-07 DIAGNOSIS — E785 Hyperlipidemia, unspecified: Secondary | ICD-10-CM | POA: Diagnosis not present

## 2020-03-07 DIAGNOSIS — R29810 Facial weakness: Secondary | ICD-10-CM | POA: Diagnosis not present

## 2020-03-07 DIAGNOSIS — M25651 Stiffness of right hip, not elsewhere classified: Secondary | ICD-10-CM | POA: Diagnosis not present

## 2020-03-07 DIAGNOSIS — E663 Overweight: Secondary | ICD-10-CM | POA: Diagnosis not present

## 2020-03-07 DIAGNOSIS — E109 Type 1 diabetes mellitus without complications: Secondary | ICD-10-CM | POA: Diagnosis not present

## 2020-03-07 DIAGNOSIS — T82898A Other specified complication of vascular prosthetic devices, implants and grafts, initial encounter: Secondary | ICD-10-CM | POA: Diagnosis not present

## 2020-03-07 DIAGNOSIS — H1013 Acute atopic conjunctivitis, bilateral: Secondary | ICD-10-CM | POA: Diagnosis not present

## 2020-03-07 DIAGNOSIS — L301 Dyshidrosis [pompholyx]: Secondary | ICD-10-CM | POA: Diagnosis not present

## 2020-03-07 DIAGNOSIS — K2970 Gastritis, unspecified, without bleeding: Secondary | ICD-10-CM | POA: Diagnosis not present

## 2020-03-07 DIAGNOSIS — Z712 Person consulting for explanation of examination or test findings: Secondary | ICD-10-CM | POA: Diagnosis not present

## 2020-03-07 DIAGNOSIS — M9902 Segmental and somatic dysfunction of thoracic region: Secondary | ICD-10-CM | POA: Diagnosis not present

## 2020-03-07 DIAGNOSIS — M25472 Effusion, left ankle: Secondary | ICD-10-CM | POA: Diagnosis not present

## 2020-03-07 DIAGNOSIS — C541 Malignant neoplasm of endometrium: Secondary | ICD-10-CM | POA: Diagnosis not present

## 2020-03-07 DIAGNOSIS — M4125 Other idiopathic scoliosis, thoracolumbar region: Secondary | ICD-10-CM | POA: Diagnosis not present

## 2020-03-07 DIAGNOSIS — I160 Hypertensive urgency: Secondary | ICD-10-CM | POA: Diagnosis not present

## 2020-03-07 DIAGNOSIS — R293 Abnormal posture: Secondary | ICD-10-CM | POA: Diagnosis not present

## 2020-03-07 DIAGNOSIS — E890 Postprocedural hypothyroidism: Secondary | ICD-10-CM | POA: Diagnosis not present

## 2020-03-07 DIAGNOSIS — G309 Alzheimer's disease, unspecified: Secondary | ICD-10-CM | POA: Diagnosis not present

## 2020-03-07 DIAGNOSIS — F319 Bipolar disorder, unspecified: Secondary | ICD-10-CM | POA: Diagnosis not present

## 2020-03-07 DIAGNOSIS — M25631 Stiffness of right wrist, not elsewhere classified: Secondary | ICD-10-CM | POA: Diagnosis not present

## 2020-03-07 DIAGNOSIS — D23111 Other benign neoplasm of skin of right upper eyelid, including canthus: Secondary | ICD-10-CM | POA: Diagnosis not present

## 2020-03-07 DIAGNOSIS — E113291 Type 2 diabetes mellitus with mild nonproliferative diabetic retinopathy without macular edema, right eye: Secondary | ICD-10-CM | POA: Diagnosis not present

## 2020-03-07 DIAGNOSIS — G9340 Encephalopathy, unspecified: Secondary | ICD-10-CM | POA: Diagnosis not present

## 2020-03-07 DIAGNOSIS — M65331 Trigger finger, right middle finger: Secondary | ICD-10-CM | POA: Diagnosis not present

## 2020-03-07 DIAGNOSIS — C679 Malignant neoplasm of bladder, unspecified: Secondary | ICD-10-CM | POA: Diagnosis not present

## 2020-03-07 DIAGNOSIS — I499 Cardiac arrhythmia, unspecified: Secondary | ICD-10-CM | POA: Diagnosis not present

## 2020-03-07 DIAGNOSIS — Z13220 Encounter for screening for lipoid disorders: Secondary | ICD-10-CM | POA: Diagnosis not present

## 2020-03-07 DIAGNOSIS — F17200 Nicotine dependence, unspecified, uncomplicated: Secondary | ICD-10-CM | POA: Diagnosis not present

## 2020-03-07 DIAGNOSIS — Z433 Encounter for attention to colostomy: Secondary | ICD-10-CM | POA: Diagnosis not present

## 2020-03-07 DIAGNOSIS — S233XXA Sprain of ligaments of thoracic spine, initial encounter: Secondary | ICD-10-CM | POA: Diagnosis not present

## 2020-03-07 DIAGNOSIS — J410 Simple chronic bronchitis: Secondary | ICD-10-CM | POA: Diagnosis not present

## 2020-03-07 DIAGNOSIS — Z89422 Acquired absence of other left toe(s): Secondary | ICD-10-CM | POA: Diagnosis not present

## 2020-03-07 DIAGNOSIS — Z91048 Other nonmedicinal substance allergy status: Secondary | ICD-10-CM | POA: Diagnosis not present

## 2020-03-07 DIAGNOSIS — E0789 Other specified disorders of thyroid: Secondary | ICD-10-CM | POA: Diagnosis not present

## 2020-03-07 DIAGNOSIS — R042 Hemoptysis: Secondary | ICD-10-CM | POA: Diagnosis not present

## 2020-03-07 DIAGNOSIS — G600 Hereditary motor and sensory neuropathy: Secondary | ICD-10-CM | POA: Diagnosis not present

## 2020-03-07 DIAGNOSIS — Z833 Family history of diabetes mellitus: Secondary | ICD-10-CM | POA: Diagnosis not present

## 2020-03-07 DIAGNOSIS — M47894 Other spondylosis, thoracic region: Secondary | ICD-10-CM | POA: Diagnosis not present

## 2020-03-07 DIAGNOSIS — D461 Refractory anemia with ring sideroblasts: Secondary | ICD-10-CM | POA: Diagnosis not present

## 2020-03-07 DIAGNOSIS — C8307 Small cell B-cell lymphoma, spleen: Secondary | ICD-10-CM | POA: Diagnosis not present

## 2020-03-07 DIAGNOSIS — M7062 Trochanteric bursitis, left hip: Secondary | ICD-10-CM | POA: Diagnosis not present

## 2020-03-07 DIAGNOSIS — E872 Acidosis: Secondary | ICD-10-CM | POA: Diagnosis not present

## 2020-03-07 DIAGNOSIS — Z930 Tracheostomy status: Secondary | ICD-10-CM | POA: Diagnosis not present

## 2020-03-07 DIAGNOSIS — D3610 Benign neoplasm of peripheral nerves and autonomic nervous system, unspecified: Secondary | ICD-10-CM | POA: Diagnosis not present

## 2020-03-07 DIAGNOSIS — Z85820 Personal history of malignant melanoma of skin: Secondary | ICD-10-CM | POA: Diagnosis not present

## 2020-03-07 DIAGNOSIS — Z832 Family history of diseases of the blood and blood-forming organs and certain disorders involving the immune mechanism: Secondary | ICD-10-CM | POA: Diagnosis not present

## 2020-03-07 DIAGNOSIS — M62561 Muscle wasting and atrophy, not elsewhere classified, right lower leg: Secondary | ICD-10-CM | POA: Diagnosis not present

## 2020-03-07 DIAGNOSIS — F39 Unspecified mood [affective] disorder: Secondary | ICD-10-CM | POA: Diagnosis not present

## 2020-03-07 DIAGNOSIS — Z8679 Personal history of other diseases of the circulatory system: Secondary | ICD-10-CM | POA: Diagnosis not present

## 2020-03-07 DIAGNOSIS — D538 Other specified nutritional anemias: Secondary | ICD-10-CM | POA: Diagnosis not present

## 2020-03-07 DIAGNOSIS — Z8673 Personal history of transient ischemic attack (TIA), and cerebral infarction without residual deficits: Secondary | ICD-10-CM | POA: Diagnosis not present

## 2020-03-07 DIAGNOSIS — M21371 Foot drop, right foot: Secondary | ICD-10-CM | POA: Diagnosis not present

## 2020-03-07 DIAGNOSIS — I25118 Atherosclerotic heart disease of native coronary artery with other forms of angina pectoris: Secondary | ICD-10-CM | POA: Diagnosis not present

## 2020-03-07 DIAGNOSIS — M47812 Spondylosis without myelopathy or radiculopathy, cervical region: Secondary | ICD-10-CM | POA: Diagnosis not present

## 2020-03-07 DIAGNOSIS — F0390 Unspecified dementia without behavioral disturbance: Secondary | ICD-10-CM | POA: Diagnosis not present

## 2020-03-07 DIAGNOSIS — Z03818 Encounter for observation for suspected exposure to other biological agents ruled out: Secondary | ICD-10-CM | POA: Diagnosis not present

## 2020-03-07 DIAGNOSIS — R7881 Bacteremia: Secondary | ICD-10-CM | POA: Diagnosis not present

## 2020-03-07 DIAGNOSIS — L720 Epidermal cyst: Secondary | ICD-10-CM | POA: Diagnosis not present

## 2020-03-07 DIAGNOSIS — J329 Chronic sinusitis, unspecified: Secondary | ICD-10-CM | POA: Diagnosis not present

## 2020-03-07 DIAGNOSIS — E786 Lipoprotein deficiency: Secondary | ICD-10-CM | POA: Diagnosis not present

## 2020-03-07 DIAGNOSIS — M159 Polyosteoarthritis, unspecified: Secondary | ICD-10-CM | POA: Diagnosis not present

## 2020-03-07 DIAGNOSIS — R0902 Hypoxemia: Secondary | ICD-10-CM | POA: Diagnosis not present

## 2020-03-07 DIAGNOSIS — M2242 Chondromalacia patellae, left knee: Secondary | ICD-10-CM | POA: Diagnosis not present

## 2020-03-07 DIAGNOSIS — M25559 Pain in unspecified hip: Secondary | ICD-10-CM | POA: Diagnosis not present

## 2020-03-07 DIAGNOSIS — D473 Essential (hemorrhagic) thrombocythemia: Secondary | ICD-10-CM | POA: Diagnosis not present

## 2020-03-07 DIAGNOSIS — D519 Vitamin B12 deficiency anemia, unspecified: Secondary | ICD-10-CM | POA: Diagnosis not present

## 2020-03-07 DIAGNOSIS — H25011 Cortical age-related cataract, right eye: Secondary | ICD-10-CM | POA: Diagnosis not present

## 2020-03-07 DIAGNOSIS — Z86711 Personal history of pulmonary embolism: Secondary | ICD-10-CM | POA: Diagnosis not present

## 2020-03-07 DIAGNOSIS — Z6834 Body mass index (BMI) 34.0-34.9, adult: Secondary | ICD-10-CM | POA: Diagnosis not present

## 2020-03-07 DIAGNOSIS — I781 Nevus, non-neoplastic: Secondary | ICD-10-CM | POA: Diagnosis not present

## 2020-03-07 DIAGNOSIS — C799 Secondary malignant neoplasm of unspecified site: Secondary | ICD-10-CM | POA: Diagnosis not present

## 2020-03-07 DIAGNOSIS — S72141D Displaced intertrochanteric fracture of right femur, subsequent encounter for closed fracture with routine healing: Secondary | ICD-10-CM | POA: Diagnosis not present

## 2020-03-07 DIAGNOSIS — G5603 Carpal tunnel syndrome, bilateral upper limbs: Secondary | ICD-10-CM | POA: Diagnosis not present

## 2020-03-07 DIAGNOSIS — Z992 Dependence on renal dialysis: Secondary | ICD-10-CM | POA: Diagnosis not present

## 2020-03-07 DIAGNOSIS — M160 Bilateral primary osteoarthritis of hip: Secondary | ICD-10-CM | POA: Diagnosis not present

## 2020-03-07 DIAGNOSIS — L97511 Non-pressure chronic ulcer of other part of right foot limited to breakdown of skin: Secondary | ICD-10-CM | POA: Diagnosis not present

## 2020-03-07 DIAGNOSIS — I25709 Atherosclerosis of coronary artery bypass graft(s), unspecified, with unspecified angina pectoris: Secondary | ICD-10-CM | POA: Diagnosis not present

## 2020-03-07 DIAGNOSIS — Z01419 Encounter for gynecological examination (general) (routine) without abnormal findings: Secondary | ICD-10-CM | POA: Diagnosis not present

## 2020-03-07 DIAGNOSIS — Z9981 Dependence on supplemental oxygen: Secondary | ICD-10-CM | POA: Diagnosis not present

## 2020-03-07 DIAGNOSIS — Z79891 Long term (current) use of opiate analgesic: Secondary | ICD-10-CM | POA: Diagnosis not present

## 2020-03-07 DIAGNOSIS — N760 Acute vaginitis: Secondary | ICD-10-CM | POA: Diagnosis not present

## 2020-03-07 DIAGNOSIS — E079 Disorder of thyroid, unspecified: Secondary | ICD-10-CM | POA: Diagnosis not present

## 2020-03-07 DIAGNOSIS — I082 Rheumatic disorders of both aortic and tricuspid valves: Secondary | ICD-10-CM | POA: Diagnosis not present

## 2020-03-07 DIAGNOSIS — L820 Inflamed seborrheic keratosis: Secondary | ICD-10-CM | POA: Diagnosis not present

## 2020-03-07 DIAGNOSIS — Z131 Encounter for screening for diabetes mellitus: Secondary | ICD-10-CM | POA: Diagnosis not present

## 2020-03-07 DIAGNOSIS — J9612 Chronic respiratory failure with hypercapnia: Secondary | ICD-10-CM | POA: Diagnosis not present

## 2020-03-07 DIAGNOSIS — R109 Unspecified abdominal pain: Secondary | ICD-10-CM | POA: Diagnosis not present

## 2020-03-07 DIAGNOSIS — T84091A Other mechanical complication of internal left hip prosthesis, initial encounter: Secondary | ICD-10-CM | POA: Diagnosis not present

## 2020-03-07 DIAGNOSIS — Z8261 Family history of arthritis: Secondary | ICD-10-CM | POA: Diagnosis not present

## 2020-03-07 DIAGNOSIS — R0981 Nasal congestion: Secondary | ICD-10-CM | POA: Diagnosis not present

## 2020-03-07 DIAGNOSIS — Z9911 Dependence on respirator [ventilator] status: Secondary | ICD-10-CM | POA: Diagnosis not present

## 2020-03-07 DIAGNOSIS — D122 Benign neoplasm of ascending colon: Secondary | ICD-10-CM | POA: Diagnosis not present

## 2020-03-07 DIAGNOSIS — H21302 Idiopathic cysts of iris, ciliary body or anterior chamber, left eye: Secondary | ICD-10-CM | POA: Diagnosis not present

## 2020-03-07 DIAGNOSIS — M79606 Pain in leg, unspecified: Secondary | ICD-10-CM | POA: Diagnosis not present

## 2020-03-07 DIAGNOSIS — H5702 Anisocoria: Secondary | ICD-10-CM | POA: Diagnosis not present

## 2020-03-07 DIAGNOSIS — Q231 Congenital insufficiency of aortic valve: Secondary | ICD-10-CM | POA: Diagnosis not present

## 2020-03-07 DIAGNOSIS — I5040 Unspecified combined systolic (congestive) and diastolic (congestive) heart failure: Secondary | ICD-10-CM | POA: Diagnosis not present

## 2020-03-07 DIAGNOSIS — L719 Rosacea, unspecified: Secondary | ICD-10-CM | POA: Diagnosis not present

## 2020-03-07 DIAGNOSIS — R634 Abnormal weight loss: Secondary | ICD-10-CM | POA: Diagnosis not present

## 2020-03-07 DIAGNOSIS — D509 Iron deficiency anemia, unspecified: Secondary | ICD-10-CM | POA: Diagnosis not present

## 2020-03-07 DIAGNOSIS — S70261A Insect bite (nonvenomous), right hip, initial encounter: Secondary | ICD-10-CM | POA: Diagnosis not present

## 2020-03-07 DIAGNOSIS — Z8546 Personal history of malignant neoplasm of prostate: Secondary | ICD-10-CM | POA: Diagnosis not present

## 2020-03-07 DIAGNOSIS — C039 Malignant neoplasm of gum, unspecified: Secondary | ICD-10-CM | POA: Diagnosis not present

## 2020-03-07 DIAGNOSIS — I25119 Atherosclerotic heart disease of native coronary artery with unspecified angina pectoris: Secondary | ICD-10-CM | POA: Diagnosis not present

## 2020-03-07 DIAGNOSIS — I6521 Occlusion and stenosis of right carotid artery: Secondary | ICD-10-CM | POA: Diagnosis not present

## 2020-03-07 DIAGNOSIS — F332 Major depressive disorder, recurrent severe without psychotic features: Secondary | ICD-10-CM | POA: Diagnosis not present

## 2020-03-07 DIAGNOSIS — G5792 Unspecified mononeuropathy of left lower limb: Secondary | ICD-10-CM | POA: Diagnosis not present

## 2020-03-07 DIAGNOSIS — Z7952 Long term (current) use of systemic steroids: Secondary | ICD-10-CM | POA: Diagnosis not present

## 2020-03-07 DIAGNOSIS — K5792 Diverticulitis of intestine, part unspecified, without perforation or abscess without bleeding: Secondary | ICD-10-CM | POA: Diagnosis not present

## 2020-03-07 DIAGNOSIS — I8311 Varicose veins of right lower extremity with inflammation: Secondary | ICD-10-CM | POA: Diagnosis not present

## 2020-03-07 DIAGNOSIS — M0579 Rheumatoid arthritis with rheumatoid factor of multiple sites without organ or systems involvement: Secondary | ICD-10-CM | POA: Diagnosis not present

## 2020-03-07 DIAGNOSIS — I9589 Other hypotension: Secondary | ICD-10-CM | POA: Diagnosis not present

## 2020-03-07 DIAGNOSIS — R55 Syncope and collapse: Secondary | ICD-10-CM | POA: Diagnosis not present

## 2020-03-07 DIAGNOSIS — R1032 Left lower quadrant pain: Secondary | ICD-10-CM | POA: Diagnosis not present

## 2020-03-07 DIAGNOSIS — N649 Disorder of breast, unspecified: Secondary | ICD-10-CM | POA: Diagnosis not present

## 2020-03-07 DIAGNOSIS — I878 Other specified disorders of veins: Secondary | ICD-10-CM | POA: Diagnosis not present

## 2020-03-07 DIAGNOSIS — E113393 Type 2 diabetes mellitus with moderate nonproliferative diabetic retinopathy without macular edema, bilateral: Secondary | ICD-10-CM | POA: Diagnosis not present

## 2020-03-07 DIAGNOSIS — N952 Postmenopausal atrophic vaginitis: Secondary | ICD-10-CM | POA: Diagnosis not present

## 2020-03-07 DIAGNOSIS — M62830 Muscle spasm of back: Secondary | ICD-10-CM | POA: Diagnosis not present

## 2020-03-07 DIAGNOSIS — Z1159 Encounter for screening for other viral diseases: Secondary | ICD-10-CM | POA: Diagnosis not present

## 2020-03-07 DIAGNOSIS — H02834 Dermatochalasis of left upper eyelid: Secondary | ICD-10-CM | POA: Diagnosis not present

## 2020-03-07 DIAGNOSIS — K115 Sialolithiasis: Secondary | ICD-10-CM | POA: Diagnosis not present

## 2020-03-07 DIAGNOSIS — M47814 Spondylosis without myelopathy or radiculopathy, thoracic region: Secondary | ICD-10-CM | POA: Diagnosis not present

## 2020-03-07 DIAGNOSIS — M19072 Primary osteoarthritis, left ankle and foot: Secondary | ICD-10-CM | POA: Diagnosis not present

## 2020-03-07 DIAGNOSIS — M5431 Sciatica, right side: Secondary | ICD-10-CM | POA: Diagnosis not present

## 2020-03-07 DIAGNOSIS — H4912 Fourth [trochlear] nerve palsy, left eye: Secondary | ICD-10-CM | POA: Diagnosis not present

## 2020-03-07 DIAGNOSIS — R279 Unspecified lack of coordination: Secondary | ICD-10-CM | POA: Diagnosis not present

## 2020-03-07 DIAGNOSIS — S42201A Unspecified fracture of upper end of right humerus, initial encounter for closed fracture: Secondary | ICD-10-CM | POA: Diagnosis not present

## 2020-03-07 DIAGNOSIS — C250 Malignant neoplasm of head of pancreas: Secondary | ICD-10-CM | POA: Diagnosis not present

## 2020-03-07 DIAGNOSIS — M4186 Other forms of scoliosis, lumbar region: Secondary | ICD-10-CM | POA: Diagnosis not present

## 2020-03-07 DIAGNOSIS — Z90710 Acquired absence of both cervix and uterus: Secondary | ICD-10-CM | POA: Diagnosis not present

## 2020-03-07 DIAGNOSIS — R8271 Bacteriuria: Secondary | ICD-10-CM | POA: Diagnosis not present

## 2020-03-07 DIAGNOSIS — L853 Xerosis cutis: Secondary | ICD-10-CM | POA: Diagnosis not present

## 2020-03-07 DIAGNOSIS — S82201K Unspecified fracture of shaft of right tibia, subsequent encounter for closed fracture with nonunion: Secondary | ICD-10-CM | POA: Diagnosis not present

## 2020-03-07 DIAGNOSIS — E088 Diabetes mellitus due to underlying condition with unspecified complications: Secondary | ICD-10-CM | POA: Diagnosis not present

## 2020-03-07 DIAGNOSIS — R7309 Other abnormal glucose: Secondary | ICD-10-CM | POA: Diagnosis not present

## 2020-03-07 DIAGNOSIS — I69322 Dysarthria following cerebral infarction: Secondary | ICD-10-CM | POA: Diagnosis not present

## 2020-03-07 DIAGNOSIS — E871 Hypo-osmolality and hyponatremia: Secondary | ICD-10-CM | POA: Diagnosis not present

## 2020-03-07 DIAGNOSIS — K760 Fatty (change of) liver, not elsewhere classified: Secondary | ICD-10-CM | POA: Diagnosis not present

## 2020-03-07 DIAGNOSIS — G20 Parkinson's disease: Secondary | ICD-10-CM | POA: Diagnosis not present

## 2020-03-07 DIAGNOSIS — R102 Pelvic and perineal pain: Secondary | ICD-10-CM | POA: Diagnosis not present

## 2020-03-07 DIAGNOSIS — Z5181 Encounter for therapeutic drug level monitoring: Secondary | ICD-10-CM | POA: Diagnosis not present

## 2020-03-07 DIAGNOSIS — Z1211 Encounter for screening for malignant neoplasm of colon: Secondary | ICD-10-CM | POA: Diagnosis not present

## 2020-03-07 DIAGNOSIS — Z92241 Personal history of systemic steroid therapy: Secondary | ICD-10-CM | POA: Diagnosis not present

## 2020-03-07 DIAGNOSIS — D3130 Benign neoplasm of unspecified choroid: Secondary | ICD-10-CM | POA: Diagnosis not present

## 2020-03-07 DIAGNOSIS — Y842 Radiological procedure and radiotherapy as the cause of abnormal reaction of the patient, or of later complication, without mention of misadventure at the time of the procedure: Secondary | ICD-10-CM | POA: Diagnosis not present

## 2020-03-07 DIAGNOSIS — M0609 Rheumatoid arthritis without rheumatoid factor, multiple sites: Secondary | ICD-10-CM | POA: Diagnosis not present

## 2020-03-07 DIAGNOSIS — M47816 Spondylosis without myelopathy or radiculopathy, lumbar region: Secondary | ICD-10-CM | POA: Diagnosis not present

## 2020-03-07 DIAGNOSIS — I951 Orthostatic hypotension: Secondary | ICD-10-CM | POA: Diagnosis not present

## 2020-03-07 DIAGNOSIS — K2950 Unspecified chronic gastritis without bleeding: Secondary | ICD-10-CM | POA: Diagnosis not present

## 2020-03-07 DIAGNOSIS — D2262 Melanocytic nevi of left upper limb, including shoulder: Secondary | ICD-10-CM | POA: Diagnosis not present

## 2020-03-07 DIAGNOSIS — I959 Hypotension, unspecified: Secondary | ICD-10-CM | POA: Diagnosis not present

## 2020-03-07 DIAGNOSIS — G43111 Migraine with aura, intractable, with status migrainosus: Secondary | ICD-10-CM | POA: Diagnosis not present

## 2020-03-07 DIAGNOSIS — M8949 Other hypertrophic osteoarthropathy, multiple sites: Secondary | ICD-10-CM | POA: Diagnosis not present

## 2020-03-07 DIAGNOSIS — B078 Other viral warts: Secondary | ICD-10-CM | POA: Diagnosis not present

## 2020-03-07 DIAGNOSIS — E789 Disorder of lipoprotein metabolism, unspecified: Secondary | ICD-10-CM | POA: Diagnosis not present

## 2020-03-07 DIAGNOSIS — M25531 Pain in right wrist: Secondary | ICD-10-CM | POA: Diagnosis not present

## 2020-03-07 DIAGNOSIS — H31009 Unspecified chorioretinal scars, unspecified eye: Secondary | ICD-10-CM | POA: Diagnosis not present

## 2020-03-07 DIAGNOSIS — T84051A Periprosthetic osteolysis of internal prosthetic left hip joint, initial encounter: Secondary | ICD-10-CM | POA: Diagnosis not present

## 2020-03-07 DIAGNOSIS — E113313 Type 2 diabetes mellitus with moderate nonproliferative diabetic retinopathy with macular edema, bilateral: Secondary | ICD-10-CM | POA: Diagnosis not present

## 2020-03-07 DIAGNOSIS — R0982 Postnasal drip: Secondary | ICD-10-CM | POA: Diagnosis not present

## 2020-03-07 DIAGNOSIS — M1712 Unilateral primary osteoarthritis, left knee: Secondary | ICD-10-CM | POA: Diagnosis not present

## 2020-03-07 DIAGNOSIS — Z9119 Patient's noncompliance with other medical treatment and regimen: Secondary | ICD-10-CM | POA: Diagnosis not present

## 2020-03-07 DIAGNOSIS — M5115 Intervertebral disc disorders with radiculopathy, thoracolumbar region: Secondary | ICD-10-CM | POA: Diagnosis not present

## 2020-03-07 DIAGNOSIS — Z6832 Body mass index (BMI) 32.0-32.9, adult: Secondary | ICD-10-CM | POA: Diagnosis not present

## 2020-03-07 DIAGNOSIS — H11821 Conjunctivochalasis, right eye: Secondary | ICD-10-CM | POA: Diagnosis not present

## 2020-03-07 DIAGNOSIS — H538 Other visual disturbances: Secondary | ICD-10-CM | POA: Diagnosis not present

## 2020-03-07 DIAGNOSIS — Z7983 Long term (current) use of bisphosphonates: Secondary | ICD-10-CM | POA: Diagnosis not present

## 2020-03-07 DIAGNOSIS — M19041 Primary osteoarthritis, right hand: Secondary | ICD-10-CM | POA: Diagnosis not present

## 2020-03-07 DIAGNOSIS — L03032 Cellulitis of left toe: Secondary | ICD-10-CM | POA: Diagnosis not present

## 2020-03-07 DIAGNOSIS — M85852 Other specified disorders of bone density and structure, left thigh: Secondary | ICD-10-CM | POA: Diagnosis not present

## 2020-03-07 DIAGNOSIS — I2699 Other pulmonary embolism without acute cor pulmonale: Secondary | ICD-10-CM | POA: Diagnosis not present

## 2020-03-07 DIAGNOSIS — H40013 Open angle with borderline findings, low risk, bilateral: Secondary | ICD-10-CM | POA: Diagnosis not present

## 2020-03-07 DIAGNOSIS — S82209A Unspecified fracture of shaft of unspecified tibia, initial encounter for closed fracture: Secondary | ICD-10-CM | POA: Diagnosis not present

## 2020-03-07 DIAGNOSIS — D485 Neoplasm of uncertain behavior of skin: Secondary | ICD-10-CM | POA: Diagnosis not present

## 2020-03-07 DIAGNOSIS — I87311 Chronic venous hypertension (idiopathic) with ulcer of right lower extremity: Secondary | ICD-10-CM | POA: Diagnosis not present

## 2020-03-07 DIAGNOSIS — H401133 Primary open-angle glaucoma, bilateral, severe stage: Secondary | ICD-10-CM | POA: Diagnosis not present

## 2020-03-07 DIAGNOSIS — D510 Vitamin B12 deficiency anemia due to intrinsic factor deficiency: Secondary | ICD-10-CM | POA: Diagnosis not present

## 2020-03-07 DIAGNOSIS — K831 Obstruction of bile duct: Secondary | ICD-10-CM | POA: Diagnosis not present

## 2020-03-07 DIAGNOSIS — D72829 Elevated white blood cell count, unspecified: Secondary | ICD-10-CM | POA: Diagnosis not present

## 2020-03-07 DIAGNOSIS — H2512 Age-related nuclear cataract, left eye: Secondary | ICD-10-CM | POA: Diagnosis not present

## 2020-03-07 DIAGNOSIS — R404 Transient alteration of awareness: Secondary | ICD-10-CM | POA: Diagnosis not present

## 2020-03-07 DIAGNOSIS — M533 Sacrococcygeal disorders, not elsewhere classified: Secondary | ICD-10-CM | POA: Diagnosis not present

## 2020-03-07 DIAGNOSIS — R41840 Attention and concentration deficit: Secondary | ICD-10-CM | POA: Diagnosis not present

## 2020-03-07 DIAGNOSIS — Z9079 Acquired absence of other genital organ(s): Secondary | ICD-10-CM | POA: Diagnosis not present

## 2020-03-07 DIAGNOSIS — M1611 Unilateral primary osteoarthritis, right hip: Secondary | ICD-10-CM | POA: Diagnosis not present

## 2020-03-07 DIAGNOSIS — Z86718 Personal history of other venous thrombosis and embolism: Secondary | ICD-10-CM | POA: Diagnosis not present

## 2020-03-07 DIAGNOSIS — E1169 Type 2 diabetes mellitus with other specified complication: Secondary | ICD-10-CM | POA: Diagnosis not present

## 2020-03-07 DIAGNOSIS — B9561 Methicillin susceptible Staphylococcus aureus infection as the cause of diseases classified elsewhere: Secondary | ICD-10-CM | POA: Diagnosis not present

## 2020-03-07 DIAGNOSIS — F3162 Bipolar disorder, current episode mixed, moderate: Secondary | ICD-10-CM | POA: Diagnosis not present

## 2020-03-07 DIAGNOSIS — L219 Seborrheic dermatitis, unspecified: Secondary | ICD-10-CM | POA: Diagnosis not present

## 2020-03-07 DIAGNOSIS — Z6830 Body mass index (BMI) 30.0-30.9, adult: Secondary | ICD-10-CM | POA: Diagnosis not present

## 2020-03-07 DIAGNOSIS — C50919 Malignant neoplasm of unspecified site of unspecified female breast: Secondary | ICD-10-CM | POA: Diagnosis not present

## 2020-03-07 DIAGNOSIS — M25572 Pain in left ankle and joints of left foot: Secondary | ICD-10-CM | POA: Diagnosis not present

## 2020-03-07 DIAGNOSIS — F5101 Primary insomnia: Secondary | ICD-10-CM | POA: Diagnosis not present

## 2020-03-07 DIAGNOSIS — M779 Enthesopathy, unspecified: Secondary | ICD-10-CM | POA: Diagnosis not present

## 2020-03-07 DIAGNOSIS — R911 Solitary pulmonary nodule: Secondary | ICD-10-CM | POA: Diagnosis not present

## 2020-03-07 DIAGNOSIS — S8991XA Unspecified injury of right lower leg, initial encounter: Secondary | ICD-10-CM | POA: Diagnosis not present

## 2020-03-07 DIAGNOSIS — G459 Transient cerebral ischemic attack, unspecified: Secondary | ICD-10-CM | POA: Diagnosis not present

## 2020-03-07 DIAGNOSIS — Z87442 Personal history of urinary calculi: Secondary | ICD-10-CM | POA: Diagnosis not present

## 2020-03-07 DIAGNOSIS — H25013 Cortical age-related cataract, bilateral: Secondary | ICD-10-CM | POA: Diagnosis not present

## 2020-03-07 DIAGNOSIS — H4423 Degenerative myopia, bilateral: Secondary | ICD-10-CM | POA: Diagnosis not present

## 2020-03-07 DIAGNOSIS — H60549 Acute eczematoid otitis externa, unspecified ear: Secondary | ICD-10-CM | POA: Diagnosis not present

## 2020-03-07 DIAGNOSIS — Z87820 Personal history of traumatic brain injury: Secondary | ICD-10-CM | POA: Diagnosis not present

## 2020-03-07 DIAGNOSIS — Z1329 Encounter for screening for other suspected endocrine disorder: Secondary | ICD-10-CM | POA: Diagnosis not present

## 2020-03-07 DIAGNOSIS — M5441 Lumbago with sciatica, right side: Secondary | ICD-10-CM | POA: Diagnosis not present

## 2020-03-07 DIAGNOSIS — L668 Other cicatricial alopecia: Secondary | ICD-10-CM | POA: Diagnosis not present

## 2020-03-07 DIAGNOSIS — F0151 Vascular dementia with behavioral disturbance: Secondary | ICD-10-CM | POA: Diagnosis not present

## 2020-03-07 DIAGNOSIS — Z882 Allergy status to sulfonamides status: Secondary | ICD-10-CM | POA: Diagnosis not present

## 2020-03-07 DIAGNOSIS — H2513 Age-related nuclear cataract, bilateral: Secondary | ICD-10-CM | POA: Diagnosis not present

## 2020-03-07 DIAGNOSIS — I5023 Acute on chronic systolic (congestive) heart failure: Secondary | ICD-10-CM | POA: Diagnosis not present

## 2020-03-07 DIAGNOSIS — R339 Retention of urine, unspecified: Secondary | ICD-10-CM | POA: Diagnosis not present

## 2020-03-07 DIAGNOSIS — H43821 Vitreomacular adhesion, right eye: Secondary | ICD-10-CM | POA: Diagnosis not present

## 2020-03-07 DIAGNOSIS — D500 Iron deficiency anemia secondary to blood loss (chronic): Secondary | ICD-10-CM | POA: Diagnosis not present

## 2020-03-07 DIAGNOSIS — Z6841 Body Mass Index (BMI) 40.0 and over, adult: Secondary | ICD-10-CM | POA: Diagnosis not present

## 2020-03-07 DIAGNOSIS — H353122 Nonexudative age-related macular degeneration, left eye, intermediate dry stage: Secondary | ICD-10-CM | POA: Diagnosis not present

## 2020-03-07 DIAGNOSIS — H52203 Unspecified astigmatism, bilateral: Secondary | ICD-10-CM | POA: Diagnosis not present

## 2020-03-07 DIAGNOSIS — E119 Type 2 diabetes mellitus without complications: Secondary | ICD-10-CM | POA: Diagnosis not present

## 2020-03-07 DIAGNOSIS — L97221 Non-pressure chronic ulcer of left calf limited to breakdown of skin: Secondary | ICD-10-CM | POA: Diagnosis not present

## 2020-03-07 DIAGNOSIS — D3501 Benign neoplasm of right adrenal gland: Secondary | ICD-10-CM | POA: Diagnosis not present

## 2020-03-07 DIAGNOSIS — K5720 Diverticulitis of large intestine with perforation and abscess without bleeding: Secondary | ICD-10-CM | POA: Diagnosis not present

## 2020-03-07 DIAGNOSIS — L409 Psoriasis, unspecified: Secondary | ICD-10-CM | POA: Diagnosis not present

## 2020-03-07 DIAGNOSIS — M9901 Segmental and somatic dysfunction of cervical region: Secondary | ICD-10-CM | POA: Diagnosis not present

## 2020-03-07 DIAGNOSIS — L905 Scar conditions and fibrosis of skin: Secondary | ICD-10-CM | POA: Diagnosis not present

## 2020-03-07 DIAGNOSIS — E1122 Type 2 diabetes mellitus with diabetic chronic kidney disease: Secondary | ICD-10-CM | POA: Diagnosis not present

## 2020-03-07 DIAGNOSIS — D271 Benign neoplasm of left ovary: Secondary | ICD-10-CM | POA: Diagnosis not present

## 2020-03-07 DIAGNOSIS — F4320 Adjustment disorder, unspecified: Secondary | ICD-10-CM | POA: Diagnosis not present

## 2020-03-07 DIAGNOSIS — R921 Mammographic calcification found on diagnostic imaging of breast: Secondary | ICD-10-CM | POA: Diagnosis not present

## 2020-03-07 DIAGNOSIS — Z9622 Myringotomy tube(s) status: Secondary | ICD-10-CM | POA: Diagnosis not present

## 2020-03-07 DIAGNOSIS — E669 Obesity, unspecified: Secondary | ICD-10-CM | POA: Diagnosis not present

## 2020-03-07 DIAGNOSIS — Z0000 Encounter for general adult medical examination without abnormal findings: Secondary | ICD-10-CM | POA: Diagnosis not present

## 2020-03-07 DIAGNOSIS — F3181 Bipolar II disorder: Secondary | ICD-10-CM | POA: Diagnosis not present

## 2020-03-07 DIAGNOSIS — D2261 Melanocytic nevi of right upper limb, including shoulder: Secondary | ICD-10-CM | POA: Diagnosis not present

## 2020-03-07 DIAGNOSIS — Z9889 Other specified postprocedural states: Secondary | ICD-10-CM | POA: Diagnosis not present

## 2020-03-07 DIAGNOSIS — J4520 Mild intermittent asthma, uncomplicated: Secondary | ICD-10-CM | POA: Diagnosis not present

## 2020-03-07 DIAGNOSIS — I728 Aneurysm of other specified arteries: Secondary | ICD-10-CM | POA: Diagnosis not present

## 2020-03-07 DIAGNOSIS — M25762 Osteophyte, left knee: Secondary | ICD-10-CM | POA: Diagnosis not present

## 2020-03-07 DIAGNOSIS — F1120 Opioid dependence, uncomplicated: Secondary | ICD-10-CM | POA: Diagnosis not present

## 2020-03-07 DIAGNOSIS — Z7689 Persons encountering health services in other specified circumstances: Secondary | ICD-10-CM | POA: Diagnosis not present

## 2020-03-07 DIAGNOSIS — K5790 Diverticulosis of intestine, part unspecified, without perforation or abscess without bleeding: Secondary | ICD-10-CM | POA: Diagnosis not present

## 2020-03-07 DIAGNOSIS — Z9861 Coronary angioplasty status: Secondary | ICD-10-CM | POA: Diagnosis not present

## 2020-03-07 DIAGNOSIS — E1149 Type 2 diabetes mellitus with other diabetic neurological complication: Secondary | ICD-10-CM | POA: Diagnosis not present

## 2020-03-07 DIAGNOSIS — S60222A Contusion of left hand, initial encounter: Secondary | ICD-10-CM | POA: Diagnosis not present

## 2020-03-07 DIAGNOSIS — M25461 Effusion, right knee: Secondary | ICD-10-CM | POA: Diagnosis not present

## 2020-03-07 DIAGNOSIS — M8580 Other specified disorders of bone density and structure, unspecified site: Secondary | ICD-10-CM | POA: Diagnosis not present

## 2020-03-07 DIAGNOSIS — G8191 Hemiplegia, unspecified affecting right dominant side: Secondary | ICD-10-CM | POA: Diagnosis not present

## 2020-03-07 DIAGNOSIS — Z780 Asymptomatic menopausal state: Secondary | ICD-10-CM | POA: Diagnosis not present

## 2020-03-07 DIAGNOSIS — H532 Diplopia: Secondary | ICD-10-CM | POA: Diagnosis not present

## 2020-03-07 DIAGNOSIS — I63212 Cerebral infarction due to unspecified occlusion or stenosis of left vertebral arteries: Secondary | ICD-10-CM | POA: Diagnosis not present

## 2020-03-07 DIAGNOSIS — H353131 Nonexudative age-related macular degeneration, bilateral, early dry stage: Secondary | ICD-10-CM | POA: Diagnosis not present

## 2020-03-07 DIAGNOSIS — Z171 Estrogen receptor negative status [ER-]: Secondary | ICD-10-CM | POA: Diagnosis not present

## 2020-03-07 DIAGNOSIS — J9611 Chronic respiratory failure with hypoxia: Secondary | ICD-10-CM | POA: Diagnosis not present

## 2020-03-07 DIAGNOSIS — J310 Chronic rhinitis: Secondary | ICD-10-CM | POA: Diagnosis not present

## 2020-03-07 DIAGNOSIS — J432 Centrilobular emphysema: Secondary | ICD-10-CM | POA: Diagnosis not present

## 2020-03-07 DIAGNOSIS — R1084 Generalized abdominal pain: Secondary | ICD-10-CM | POA: Diagnosis not present

## 2020-03-07 DIAGNOSIS — K435 Parastomal hernia without obstruction or  gangrene: Secondary | ICD-10-CM | POA: Diagnosis not present

## 2020-03-07 DIAGNOSIS — M5415 Radiculopathy, thoracolumbar region: Secondary | ICD-10-CM | POA: Diagnosis not present

## 2020-03-07 DIAGNOSIS — F308 Other manic episodes: Secondary | ICD-10-CM | POA: Diagnosis not present

## 2020-03-07 DIAGNOSIS — M79602 Pain in left arm: Secondary | ICD-10-CM | POA: Diagnosis not present

## 2020-03-07 DIAGNOSIS — M5137 Other intervertebral disc degeneration, lumbosacral region: Secondary | ICD-10-CM | POA: Diagnosis not present

## 2020-03-07 DIAGNOSIS — J45909 Unspecified asthma, uncomplicated: Secondary | ICD-10-CM | POA: Diagnosis not present

## 2020-03-07 DIAGNOSIS — M79676 Pain in unspecified toe(s): Secondary | ICD-10-CM | POA: Diagnosis not present

## 2020-03-07 DIAGNOSIS — R351 Nocturia: Secondary | ICD-10-CM | POA: Diagnosis not present

## 2020-03-07 DIAGNOSIS — I4821 Permanent atrial fibrillation: Secondary | ICD-10-CM | POA: Diagnosis not present

## 2020-03-07 DIAGNOSIS — I341 Nonrheumatic mitral (valve) prolapse: Secondary | ICD-10-CM | POA: Diagnosis not present

## 2020-03-07 DIAGNOSIS — Z7951 Long term (current) use of inhaled steroids: Secondary | ICD-10-CM | POA: Diagnosis not present

## 2020-03-07 DIAGNOSIS — M25552 Pain in left hip: Secondary | ICD-10-CM | POA: Diagnosis not present

## 2020-03-07 DIAGNOSIS — R112 Nausea with vomiting, unspecified: Secondary | ICD-10-CM | POA: Diagnosis not present

## 2020-03-07 DIAGNOSIS — I69328 Other speech and language deficits following cerebral infarction: Secondary | ICD-10-CM | POA: Diagnosis not present

## 2020-03-07 DIAGNOSIS — L729 Follicular cyst of the skin and subcutaneous tissue, unspecified: Secondary | ICD-10-CM | POA: Diagnosis not present

## 2020-03-07 DIAGNOSIS — I482 Chronic atrial fibrillation, unspecified: Secondary | ICD-10-CM | POA: Diagnosis not present

## 2020-03-07 DIAGNOSIS — S42201D Unspecified fracture of upper end of right humerus, subsequent encounter for fracture with routine healing: Secondary | ICD-10-CM | POA: Diagnosis not present

## 2020-03-07 DIAGNOSIS — D508 Other iron deficiency anemias: Secondary | ICD-10-CM | POA: Diagnosis not present

## 2020-03-07 DIAGNOSIS — I63411 Cerebral infarction due to embolism of right middle cerebral artery: Secondary | ICD-10-CM | POA: Diagnosis not present

## 2020-03-07 DIAGNOSIS — Z6835 Body mass index (BMI) 35.0-35.9, adult: Secondary | ICD-10-CM | POA: Diagnosis not present

## 2020-03-07 DIAGNOSIS — M5489 Other dorsalgia: Secondary | ICD-10-CM | POA: Diagnosis not present

## 2020-03-07 DIAGNOSIS — R5382 Chronic fatigue, unspecified: Secondary | ICD-10-CM | POA: Diagnosis not present

## 2020-03-07 DIAGNOSIS — H1131 Conjunctival hemorrhage, right eye: Secondary | ICD-10-CM | POA: Diagnosis not present

## 2020-03-07 DIAGNOSIS — S52501D Unspecified fracture of the lower end of right radius, subsequent encounter for closed fracture with routine healing: Secondary | ICD-10-CM | POA: Diagnosis not present

## 2020-03-07 DIAGNOSIS — Z6836 Body mass index (BMI) 36.0-36.9, adult: Secondary | ICD-10-CM | POA: Diagnosis not present

## 2020-03-07 DIAGNOSIS — F3170 Bipolar disorder, currently in remission, most recent episode unspecified: Secondary | ICD-10-CM | POA: Diagnosis not present

## 2020-03-07 DIAGNOSIS — K068 Other specified disorders of gingiva and edentulous alveolar ridge: Secondary | ICD-10-CM | POA: Diagnosis not present

## 2020-03-07 DIAGNOSIS — M62521 Muscle wasting and atrophy, not elsewhere classified, right upper arm: Secondary | ICD-10-CM | POA: Diagnosis not present

## 2020-03-07 DIAGNOSIS — M79645 Pain in left finger(s): Secondary | ICD-10-CM | POA: Diagnosis not present

## 2020-03-07 DIAGNOSIS — K4091 Unilateral inguinal hernia, without obstruction or gangrene, recurrent: Secondary | ICD-10-CM | POA: Diagnosis not present

## 2020-03-07 DIAGNOSIS — K869 Disease of pancreas, unspecified: Secondary | ICD-10-CM | POA: Diagnosis not present

## 2020-03-07 DIAGNOSIS — H35372 Puckering of macula, left eye: Secondary | ICD-10-CM | POA: Diagnosis not present

## 2020-03-07 DIAGNOSIS — T6701XA Heatstroke and sunstroke, initial encounter: Secondary | ICD-10-CM | POA: Diagnosis not present

## 2020-03-07 DIAGNOSIS — E0843 Diabetes mellitus due to underlying condition with diabetic autonomic (poly)neuropathy: Secondary | ICD-10-CM | POA: Diagnosis not present

## 2020-03-07 DIAGNOSIS — M2010 Hallux valgus (acquired), unspecified foot: Secondary | ICD-10-CM | POA: Diagnosis not present

## 2020-03-07 DIAGNOSIS — L110 Acquired keratosis follicularis: Secondary | ICD-10-CM | POA: Diagnosis not present

## 2020-03-07 DIAGNOSIS — L738 Other specified follicular disorders: Secondary | ICD-10-CM | POA: Diagnosis not present

## 2020-03-07 DIAGNOSIS — Z125 Encounter for screening for malignant neoplasm of prostate: Secondary | ICD-10-CM | POA: Diagnosis not present

## 2020-03-07 DIAGNOSIS — M5116 Intervertebral disc disorders with radiculopathy, lumbar region: Secondary | ICD-10-CM | POA: Diagnosis not present

## 2020-03-07 DIAGNOSIS — Z8739 Personal history of other diseases of the musculoskeletal system and connective tissue: Secondary | ICD-10-CM | POA: Diagnosis not present

## 2020-03-07 DIAGNOSIS — R011 Cardiac murmur, unspecified: Secondary | ICD-10-CM | POA: Diagnosis not present

## 2020-03-07 DIAGNOSIS — S8262XA Displaced fracture of lateral malleolus of left fibula, initial encounter for closed fracture: Secondary | ICD-10-CM | POA: Diagnosis not present

## 2020-03-07 DIAGNOSIS — I6522 Occlusion and stenosis of left carotid artery: Secondary | ICD-10-CM | POA: Diagnosis not present

## 2020-03-07 DIAGNOSIS — M4802 Spinal stenosis, cervical region: Secondary | ICD-10-CM | POA: Diagnosis not present

## 2020-03-07 DIAGNOSIS — M25611 Stiffness of right shoulder, not elsewhere classified: Secondary | ICD-10-CM | POA: Diagnosis not present

## 2020-03-07 DIAGNOSIS — R52 Pain, unspecified: Secondary | ICD-10-CM | POA: Diagnosis not present

## 2020-03-07 DIAGNOSIS — Z7982 Long term (current) use of aspirin: Secondary | ICD-10-CM | POA: Diagnosis not present

## 2020-03-07 DIAGNOSIS — I272 Pulmonary hypertension, unspecified: Secondary | ICD-10-CM | POA: Diagnosis not present

## 2020-03-07 DIAGNOSIS — K2930 Chronic superficial gastritis without bleeding: Secondary | ICD-10-CM | POA: Diagnosis not present

## 2020-03-07 DIAGNOSIS — Z800 Family history of malignant neoplasm of digestive organs: Secondary | ICD-10-CM | POA: Diagnosis not present

## 2020-03-07 DIAGNOSIS — Z1331 Encounter for screening for depression: Secondary | ICD-10-CM | POA: Diagnosis not present

## 2020-03-07 DIAGNOSIS — D696 Thrombocytopenia, unspecified: Secondary | ICD-10-CM | POA: Diagnosis not present

## 2020-03-07 DIAGNOSIS — I071 Rheumatic tricuspid insufficiency: Secondary | ICD-10-CM | POA: Diagnosis not present

## 2020-03-07 DIAGNOSIS — Z96653 Presence of artificial knee joint, bilateral: Secondary | ICD-10-CM | POA: Diagnosis not present

## 2020-03-07 DIAGNOSIS — E291 Testicular hypofunction: Secondary | ICD-10-CM | POA: Diagnosis not present

## 2020-03-07 DIAGNOSIS — Z20822 Contact with and (suspected) exposure to covid-19: Secondary | ICD-10-CM | POA: Diagnosis not present

## 2020-03-07 DIAGNOSIS — L603 Nail dystrophy: Secondary | ICD-10-CM | POA: Diagnosis not present

## 2020-03-07 DIAGNOSIS — R400 Somnolence: Secondary | ICD-10-CM | POA: Diagnosis not present

## 2020-03-07 DIAGNOSIS — M4316 Spondylolisthesis, lumbar region: Secondary | ICD-10-CM | POA: Diagnosis not present

## 2020-03-07 DIAGNOSIS — M79651 Pain in right thigh: Secondary | ICD-10-CM | POA: Diagnosis not present

## 2020-03-07 DIAGNOSIS — H11153 Pinguecula, bilateral: Secondary | ICD-10-CM | POA: Diagnosis not present

## 2020-03-07 DIAGNOSIS — D61818 Other pancytopenia: Secondary | ICD-10-CM | POA: Diagnosis not present

## 2020-03-07 DIAGNOSIS — H547 Unspecified visual loss: Secondary | ICD-10-CM | POA: Diagnosis not present

## 2020-03-07 DIAGNOSIS — E113293 Type 2 diabetes mellitus with mild nonproliferative diabetic retinopathy without macular edema, bilateral: Secondary | ICD-10-CM | POA: Diagnosis not present

## 2020-03-07 DIAGNOSIS — E43 Unspecified severe protein-calorie malnutrition: Secondary | ICD-10-CM | POA: Diagnosis not present

## 2020-03-07 DIAGNOSIS — M48061 Spinal stenosis, lumbar region without neurogenic claudication: Secondary | ICD-10-CM | POA: Diagnosis not present

## 2020-03-07 DIAGNOSIS — E034 Atrophy of thyroid (acquired): Secondary | ICD-10-CM | POA: Diagnosis not present

## 2020-03-07 DIAGNOSIS — M79675 Pain in left toe(s): Secondary | ICD-10-CM | POA: Diagnosis not present

## 2020-03-07 DIAGNOSIS — J302 Other seasonal allergic rhinitis: Secondary | ICD-10-CM | POA: Diagnosis not present

## 2020-03-07 DIAGNOSIS — Z85818 Personal history of malignant neoplasm of other sites of lip, oral cavity, and pharynx: Secondary | ICD-10-CM | POA: Diagnosis not present

## 2020-03-07 DIAGNOSIS — H10413 Chronic giant papillary conjunctivitis, bilateral: Secondary | ICD-10-CM | POA: Diagnosis not present

## 2020-03-07 DIAGNOSIS — H3581 Retinal edema: Secondary | ICD-10-CM | POA: Diagnosis not present

## 2020-03-07 DIAGNOSIS — S46112S Strain of muscle, fascia and tendon of long head of biceps, left arm, sequela: Secondary | ICD-10-CM | POA: Diagnosis not present

## 2020-03-07 DIAGNOSIS — I428 Other cardiomyopathies: Secondary | ICD-10-CM | POA: Diagnosis not present

## 2020-03-07 DIAGNOSIS — F17220 Nicotine dependence, chewing tobacco, uncomplicated: Secondary | ICD-10-CM | POA: Diagnosis not present

## 2020-03-07 DIAGNOSIS — M19042 Primary osteoarthritis, left hand: Secondary | ICD-10-CM | POA: Diagnosis not present

## 2020-03-07 DIAGNOSIS — S82401K Unspecified fracture of shaft of right fibula, subsequent encounter for closed fracture with nonunion: Secondary | ICD-10-CM | POA: Diagnosis not present

## 2020-03-07 DIAGNOSIS — R1900 Intra-abdominal and pelvic swelling, mass and lump, unspecified site: Secondary | ICD-10-CM | POA: Diagnosis not present

## 2020-03-07 DIAGNOSIS — M75102 Unspecified rotator cuff tear or rupture of left shoulder, not specified as traumatic: Secondary | ICD-10-CM | POA: Diagnosis not present

## 2020-03-07 DIAGNOSIS — S82409A Unspecified fracture of shaft of unspecified fibula, initial encounter for closed fracture: Secondary | ICD-10-CM | POA: Diagnosis not present

## 2020-03-07 DIAGNOSIS — M7581 Other shoulder lesions, right shoulder: Secondary | ICD-10-CM | POA: Diagnosis not present

## 2020-03-07 DIAGNOSIS — I471 Supraventricular tachycardia: Secondary | ICD-10-CM | POA: Diagnosis not present

## 2020-03-07 DIAGNOSIS — X32XXXD Exposure to sunlight, subsequent encounter: Secondary | ICD-10-CM | POA: Diagnosis not present

## 2020-03-07 DIAGNOSIS — R4582 Worries: Secondary | ICD-10-CM | POA: Diagnosis not present

## 2020-03-07 DIAGNOSIS — E113292 Type 2 diabetes mellitus with mild nonproliferative diabetic retinopathy without macular edema, left eye: Secondary | ICD-10-CM | POA: Diagnosis not present

## 2020-03-07 DIAGNOSIS — H539 Unspecified visual disturbance: Secondary | ICD-10-CM | POA: Diagnosis not present

## 2020-03-07 DIAGNOSIS — L700 Acne vulgaris: Secondary | ICD-10-CM | POA: Diagnosis not present

## 2020-03-07 DIAGNOSIS — Z6837 Body mass index (BMI) 37.0-37.9, adult: Secondary | ICD-10-CM | POA: Diagnosis not present

## 2020-03-07 DIAGNOSIS — M2042 Other hammer toe(s) (acquired), left foot: Secondary | ICD-10-CM | POA: Diagnosis not present

## 2020-03-07 DIAGNOSIS — N2889 Other specified disorders of kidney and ureter: Secondary | ICD-10-CM | POA: Diagnosis not present

## 2020-03-07 DIAGNOSIS — Z6825 Body mass index (BMI) 25.0-25.9, adult: Secondary | ICD-10-CM | POA: Diagnosis not present

## 2020-03-07 DIAGNOSIS — Z944 Liver transplant status: Secondary | ICD-10-CM | POA: Diagnosis not present

## 2020-03-07 DIAGNOSIS — G939 Disorder of brain, unspecified: Secondary | ICD-10-CM | POA: Diagnosis not present

## 2020-03-07 DIAGNOSIS — Z01811 Encounter for preprocedural respiratory examination: Secondary | ICD-10-CM | POA: Diagnosis not present

## 2020-03-07 DIAGNOSIS — M2550 Pain in unspecified joint: Secondary | ICD-10-CM | POA: Diagnosis not present

## 2020-03-07 DIAGNOSIS — R296 Repeated falls: Secondary | ICD-10-CM | POA: Diagnosis not present

## 2020-03-07 DIAGNOSIS — Z952 Presence of prosthetic heart valve: Secondary | ICD-10-CM | POA: Diagnosis not present

## 2020-03-07 DIAGNOSIS — H6981 Other specified disorders of Eustachian tube, right ear: Secondary | ICD-10-CM | POA: Diagnosis not present

## 2020-03-07 DIAGNOSIS — S52601D Unspecified fracture of lower end of right ulna, subsequent encounter for closed fracture with routine healing: Secondary | ICD-10-CM | POA: Diagnosis not present

## 2020-03-07 DIAGNOSIS — R278 Other lack of coordination: Secondary | ICD-10-CM | POA: Diagnosis not present

## 2020-03-07 DIAGNOSIS — R0781 Pleurodynia: Secondary | ICD-10-CM | POA: Diagnosis not present

## 2020-03-07 DIAGNOSIS — T84061A Wear of articular bearing surface of internal prosthetic left hip joint, initial encounter: Secondary | ICD-10-CM | POA: Diagnosis not present

## 2020-03-07 DIAGNOSIS — G4733 Obstructive sleep apnea (adult) (pediatric): Secondary | ICD-10-CM | POA: Diagnosis not present

## 2020-03-07 DIAGNOSIS — H53483 Generalized contraction of visual field, bilateral: Secondary | ICD-10-CM | POA: Diagnosis not present

## 2020-03-07 DIAGNOSIS — F3340 Major depressive disorder, recurrent, in remission, unspecified: Secondary | ICD-10-CM | POA: Diagnosis not present

## 2020-03-07 DIAGNOSIS — R0789 Other chest pain: Secondary | ICD-10-CM | POA: Diagnosis not present

## 2020-03-07 DIAGNOSIS — D571 Sickle-cell disease without crisis: Secondary | ICD-10-CM | POA: Diagnosis not present

## 2020-03-07 DIAGNOSIS — I6930 Unspecified sequelae of cerebral infarction: Secondary | ICD-10-CM | POA: Diagnosis not present

## 2020-03-07 DIAGNOSIS — Z1283 Encounter for screening for malignant neoplasm of skin: Secondary | ICD-10-CM | POA: Diagnosis not present

## 2020-03-07 DIAGNOSIS — M7521 Bicipital tendinitis, right shoulder: Secondary | ICD-10-CM | POA: Diagnosis not present

## 2020-03-07 DIAGNOSIS — R5381 Other malaise: Secondary | ICD-10-CM | POA: Diagnosis not present

## 2020-03-07 DIAGNOSIS — N402 Nodular prostate without lower urinary tract symptoms: Secondary | ICD-10-CM | POA: Diagnosis not present

## 2020-03-07 DIAGNOSIS — I739 Peripheral vascular disease, unspecified: Secondary | ICD-10-CM | POA: Diagnosis not present

## 2020-03-07 DIAGNOSIS — R252 Cramp and spasm: Secondary | ICD-10-CM | POA: Diagnosis not present

## 2020-03-07 DIAGNOSIS — M5416 Radiculopathy, lumbar region: Secondary | ICD-10-CM | POA: Diagnosis not present

## 2020-03-07 DIAGNOSIS — F5102 Adjustment insomnia: Secondary | ICD-10-CM | POA: Diagnosis not present

## 2020-03-07 DIAGNOSIS — E042 Nontoxic multinodular goiter: Secondary | ICD-10-CM | POA: Diagnosis not present

## 2020-03-07 DIAGNOSIS — Z85118 Personal history of other malignant neoplasm of bronchus and lung: Secondary | ICD-10-CM | POA: Diagnosis not present

## 2020-03-07 DIAGNOSIS — C50012 Malignant neoplasm of nipple and areola, left female breast: Secondary | ICD-10-CM | POA: Diagnosis not present

## 2020-03-07 DIAGNOSIS — E0590 Thyrotoxicosis, unspecified without thyrotoxic crisis or storm: Secondary | ICD-10-CM | POA: Diagnosis not present

## 2020-03-07 DIAGNOSIS — I6389 Other cerebral infarction: Secondary | ICD-10-CM | POA: Diagnosis not present

## 2020-03-07 DIAGNOSIS — H47213 Primary optic atrophy, bilateral: Secondary | ICD-10-CM | POA: Diagnosis not present

## 2020-03-07 DIAGNOSIS — H90A12 Conductive hearing loss, unilateral, left ear with restricted hearing on the contralateral side: Secondary | ICD-10-CM | POA: Diagnosis not present

## 2020-03-07 DIAGNOSIS — L858 Other specified epidermal thickening: Secondary | ICD-10-CM | POA: Diagnosis not present

## 2020-03-07 DIAGNOSIS — K631 Perforation of intestine (nontraumatic): Secondary | ICD-10-CM | POA: Diagnosis not present

## 2020-03-07 DIAGNOSIS — G9341 Metabolic encephalopathy: Secondary | ICD-10-CM | POA: Diagnosis not present

## 2020-03-07 DIAGNOSIS — F418 Other specified anxiety disorders: Secondary | ICD-10-CM | POA: Diagnosis not present

## 2020-03-07 DIAGNOSIS — X32XXXS Exposure to sunlight, sequela: Secondary | ICD-10-CM | POA: Diagnosis not present

## 2020-03-07 DIAGNOSIS — E539 Vitamin B deficiency, unspecified: Secondary | ICD-10-CM | POA: Diagnosis not present

## 2020-03-07 DIAGNOSIS — K7011 Alcoholic hepatitis with ascites: Secondary | ICD-10-CM | POA: Diagnosis not present

## 2020-03-07 DIAGNOSIS — Z85828 Personal history of other malignant neoplasm of skin: Secondary | ICD-10-CM | POA: Diagnosis not present

## 2020-03-07 DIAGNOSIS — J1282 Pneumonia due to coronavirus disease 2019: Secondary | ICD-10-CM | POA: Diagnosis not present

## 2020-03-07 DIAGNOSIS — I208 Other forms of angina pectoris: Secondary | ICD-10-CM | POA: Diagnosis not present

## 2020-03-07 DIAGNOSIS — R140 Abdominal distension (gaseous): Secondary | ICD-10-CM | POA: Diagnosis not present

## 2020-03-07 DIAGNOSIS — H353120 Nonexudative age-related macular degeneration, left eye, stage unspecified: Secondary | ICD-10-CM | POA: Diagnosis not present

## 2020-03-07 DIAGNOSIS — Z7189 Other specified counseling: Secondary | ICD-10-CM | POA: Diagnosis not present

## 2020-03-07 DIAGNOSIS — W57XXXA Bitten or stung by nonvenomous insect and other nonvenomous arthropods, initial encounter: Secondary | ICD-10-CM | POA: Diagnosis not present

## 2020-03-07 DIAGNOSIS — I2510 Atherosclerotic heart disease of native coronary artery without angina pectoris: Secondary | ICD-10-CM | POA: Diagnosis not present

## 2020-03-07 DIAGNOSIS — E118 Type 2 diabetes mellitus with unspecified complications: Secondary | ICD-10-CM | POA: Diagnosis not present

## 2020-03-07 DIAGNOSIS — M4726 Other spondylosis with radiculopathy, lumbar region: Secondary | ICD-10-CM | POA: Diagnosis not present

## 2020-03-07 DIAGNOSIS — R195 Other fecal abnormalities: Secondary | ICD-10-CM | POA: Diagnosis not present

## 2020-03-07 DIAGNOSIS — J3489 Other specified disorders of nose and nasal sinuses: Secondary | ICD-10-CM | POA: Diagnosis not present

## 2020-03-07 DIAGNOSIS — H61001 Unspecified perichondritis of right external ear: Secondary | ICD-10-CM | POA: Diagnosis not present

## 2020-03-07 DIAGNOSIS — R350 Frequency of micturition: Secondary | ICD-10-CM | POA: Diagnosis not present

## 2020-03-07 DIAGNOSIS — Z954 Presence of other heart-valve replacement: Secondary | ICD-10-CM | POA: Diagnosis not present

## 2020-03-07 DIAGNOSIS — E1129 Type 2 diabetes mellitus with other diabetic kidney complication: Secondary | ICD-10-CM | POA: Diagnosis not present

## 2020-03-07 DIAGNOSIS — S42292A Other displaced fracture of upper end of left humerus, initial encounter for closed fracture: Secondary | ICD-10-CM | POA: Diagnosis not present

## 2020-03-07 DIAGNOSIS — I350 Nonrheumatic aortic (valve) stenosis: Secondary | ICD-10-CM | POA: Diagnosis not present

## 2020-03-07 DIAGNOSIS — Z6823 Body mass index (BMI) 23.0-23.9, adult: Secondary | ICD-10-CM | POA: Diagnosis not present

## 2020-03-07 DIAGNOSIS — I6501 Occlusion and stenosis of right vertebral artery: Secondary | ICD-10-CM | POA: Diagnosis not present

## 2020-03-07 DIAGNOSIS — E1121 Type 2 diabetes mellitus with diabetic nephropathy: Secondary | ICD-10-CM | POA: Diagnosis not present

## 2020-03-07 DIAGNOSIS — R946 Abnormal results of thyroid function studies: Secondary | ICD-10-CM | POA: Diagnosis not present

## 2020-03-07 DIAGNOSIS — M4326 Fusion of spine, lumbar region: Secondary | ICD-10-CM | POA: Diagnosis not present

## 2020-03-07 DIAGNOSIS — H1045 Other chronic allergic conjunctivitis: Secondary | ICD-10-CM | POA: Diagnosis not present

## 2020-03-07 DIAGNOSIS — Z23 Encounter for immunization: Secondary | ICD-10-CM | POA: Diagnosis not present

## 2020-03-07 DIAGNOSIS — I38 Endocarditis, valve unspecified: Secondary | ICD-10-CM | POA: Diagnosis not present

## 2020-03-07 DIAGNOSIS — R3989 Other symptoms and signs involving the genitourinary system: Secondary | ICD-10-CM | POA: Diagnosis not present

## 2020-03-07 DIAGNOSIS — R933 Abnormal findings on diagnostic imaging of other parts of digestive tract: Secondary | ICD-10-CM | POA: Diagnosis not present

## 2020-03-07 DIAGNOSIS — R1010 Upper abdominal pain, unspecified: Secondary | ICD-10-CM | POA: Diagnosis not present

## 2020-03-07 DIAGNOSIS — K2270 Barrett's esophagus without dysplasia: Secondary | ICD-10-CM | POA: Diagnosis not present

## 2020-03-07 DIAGNOSIS — M25622 Stiffness of left elbow, not elsewhere classified: Secondary | ICD-10-CM | POA: Diagnosis not present

## 2020-03-07 DIAGNOSIS — M8589 Other specified disorders of bone density and structure, multiple sites: Secondary | ICD-10-CM | POA: Diagnosis not present

## 2020-03-07 DIAGNOSIS — F444 Conversion disorder with motor symptom or deficit: Secondary | ICD-10-CM | POA: Diagnosis not present

## 2020-03-07 DIAGNOSIS — D369 Benign neoplasm, unspecified site: Secondary | ICD-10-CM | POA: Diagnosis not present

## 2020-03-07 DIAGNOSIS — I6523 Occlusion and stenosis of bilateral carotid arteries: Secondary | ICD-10-CM | POA: Diagnosis not present

## 2020-03-07 DIAGNOSIS — Z794 Long term (current) use of insulin: Secondary | ICD-10-CM | POA: Diagnosis not present

## 2020-03-07 DIAGNOSIS — H0279 Other degenerative disorders of eyelid and periocular area: Secondary | ICD-10-CM | POA: Diagnosis not present

## 2020-03-07 DIAGNOSIS — C189 Malignant neoplasm of colon, unspecified: Secondary | ICD-10-CM | POA: Diagnosis not present

## 2020-03-07 DIAGNOSIS — G308 Other Alzheimer's disease: Secondary | ICD-10-CM | POA: Diagnosis not present

## 2020-03-07 DIAGNOSIS — M5410 Radiculopathy, site unspecified: Secondary | ICD-10-CM | POA: Diagnosis not present

## 2020-03-07 DIAGNOSIS — C8330 Diffuse large B-cell lymphoma, unspecified site: Secondary | ICD-10-CM | POA: Diagnosis not present

## 2020-03-07 DIAGNOSIS — K50913 Crohn's disease, unspecified, with fistula: Secondary | ICD-10-CM | POA: Diagnosis not present

## 2020-03-07 DIAGNOSIS — H26492 Other secondary cataract, left eye: Secondary | ICD-10-CM | POA: Diagnosis not present

## 2020-03-07 DIAGNOSIS — M25562 Pain in left knee: Secondary | ICD-10-CM | POA: Diagnosis not present

## 2020-03-07 DIAGNOSIS — H34831 Tributary (branch) retinal vein occlusion, right eye, with macular edema: Secondary | ICD-10-CM | POA: Diagnosis not present

## 2020-03-07 DIAGNOSIS — G4701 Insomnia due to medical condition: Secondary | ICD-10-CM | POA: Diagnosis not present

## 2020-03-07 DIAGNOSIS — N411 Chronic prostatitis: Secondary | ICD-10-CM | POA: Diagnosis not present

## 2020-03-07 DIAGNOSIS — H35352 Cystoid macular degeneration, left eye: Secondary | ICD-10-CM | POA: Diagnosis not present

## 2020-03-07 DIAGNOSIS — D235 Other benign neoplasm of skin of trunk: Secondary | ICD-10-CM | POA: Diagnosis not present

## 2020-03-07 DIAGNOSIS — Z8051 Family history of malignant neoplasm of kidney: Secondary | ICD-10-CM | POA: Diagnosis not present

## 2020-03-07 DIAGNOSIS — I4819 Other persistent atrial fibrillation: Secondary | ICD-10-CM | POA: Diagnosis not present

## 2020-03-07 DIAGNOSIS — D34 Benign neoplasm of thyroid gland: Secondary | ICD-10-CM | POA: Diagnosis not present

## 2020-03-07 DIAGNOSIS — I723 Aneurysm of iliac artery: Secondary | ICD-10-CM | POA: Diagnosis not present

## 2020-03-07 DIAGNOSIS — L97201 Non-pressure chronic ulcer of unspecified calf limited to breakdown of skin: Secondary | ICD-10-CM | POA: Diagnosis not present

## 2020-03-07 DIAGNOSIS — R443 Hallucinations, unspecified: Secondary | ICD-10-CM | POA: Diagnosis not present

## 2020-03-07 DIAGNOSIS — G250 Essential tremor: Secondary | ICD-10-CM | POA: Diagnosis not present

## 2020-03-07 DIAGNOSIS — H938X3 Other specified disorders of ear, bilateral: Secondary | ICD-10-CM | POA: Diagnosis not present

## 2020-03-07 DIAGNOSIS — D472 Monoclonal gammopathy: Secondary | ICD-10-CM | POA: Diagnosis not present

## 2020-03-07 DIAGNOSIS — E46 Unspecified protein-calorie malnutrition: Secondary | ICD-10-CM | POA: Diagnosis not present

## 2020-03-07 DIAGNOSIS — A419 Sepsis, unspecified organism: Secondary | ICD-10-CM | POA: Diagnosis not present

## 2020-03-07 DIAGNOSIS — R2681 Unsteadiness on feet: Secondary | ICD-10-CM | POA: Diagnosis not present

## 2020-03-07 DIAGNOSIS — Z974 Presence of external hearing-aid: Secondary | ICD-10-CM | POA: Diagnosis not present

## 2020-03-07 DIAGNOSIS — N8500 Endometrial hyperplasia, unspecified: Secondary | ICD-10-CM | POA: Diagnosis not present

## 2020-03-07 DIAGNOSIS — L218 Other seborrheic dermatitis: Secondary | ICD-10-CM | POA: Diagnosis not present

## 2020-03-07 DIAGNOSIS — G2581 Restless legs syndrome: Secondary | ICD-10-CM | POA: Diagnosis not present

## 2020-03-07 DIAGNOSIS — I361 Nonrheumatic tricuspid (valve) insufficiency: Secondary | ICD-10-CM | POA: Diagnosis not present

## 2020-03-07 DIAGNOSIS — Z7984 Long term (current) use of oral hypoglycemic drugs: Secondary | ICD-10-CM | POA: Diagnosis not present

## 2020-03-07 DIAGNOSIS — M7918 Myalgia, other site: Secondary | ICD-10-CM | POA: Diagnosis not present

## 2020-03-07 DIAGNOSIS — E1065 Type 1 diabetes mellitus with hyperglycemia: Secondary | ICD-10-CM | POA: Diagnosis not present

## 2020-03-07 DIAGNOSIS — Z811 Family history of alcohol abuse and dependence: Secondary | ICD-10-CM | POA: Diagnosis not present

## 2020-03-07 DIAGNOSIS — R531 Weakness: Secondary | ICD-10-CM | POA: Diagnosis not present

## 2020-03-07 DIAGNOSIS — G43909 Migraine, unspecified, not intractable, without status migrainosus: Secondary | ICD-10-CM | POA: Diagnosis not present

## 2020-03-07 DIAGNOSIS — Z853 Personal history of malignant neoplasm of breast: Secondary | ICD-10-CM | POA: Diagnosis not present

## 2020-03-07 DIAGNOSIS — E1151 Type 2 diabetes mellitus with diabetic peripheral angiopathy without gangrene: Secondary | ICD-10-CM | POA: Diagnosis not present

## 2020-03-07 DIAGNOSIS — M1000 Idiopathic gout, unspecified site: Secondary | ICD-10-CM | POA: Diagnosis not present

## 2020-03-07 DIAGNOSIS — N3001 Acute cystitis with hematuria: Secondary | ICD-10-CM | POA: Diagnosis not present

## 2020-03-07 DIAGNOSIS — L299 Pruritus, unspecified: Secondary | ICD-10-CM | POA: Diagnosis not present

## 2020-03-07 DIAGNOSIS — H31012 Macula scars of posterior pole (postinflammatory) (post-traumatic), left eye: Secondary | ICD-10-CM | POA: Diagnosis not present

## 2020-03-07 DIAGNOSIS — L638 Other alopecia areata: Secondary | ICD-10-CM | POA: Diagnosis not present

## 2020-03-07 DIAGNOSIS — Z96642 Presence of left artificial hip joint: Secondary | ICD-10-CM | POA: Diagnosis not present

## 2020-03-07 DIAGNOSIS — R1319 Other dysphagia: Secondary | ICD-10-CM | POA: Diagnosis not present

## 2020-03-07 DIAGNOSIS — I10 Essential (primary) hypertension: Secondary | ICD-10-CM | POA: Diagnosis not present

## 2020-03-07 DIAGNOSIS — E6609 Other obesity due to excess calories: Secondary | ICD-10-CM | POA: Diagnosis not present

## 2020-03-07 DIAGNOSIS — Z9484 Stem cells transplant status: Secondary | ICD-10-CM | POA: Diagnosis not present

## 2020-03-07 DIAGNOSIS — C50511 Malignant neoplasm of lower-outer quadrant of right female breast: Secondary | ICD-10-CM | POA: Diagnosis not present

## 2020-03-07 DIAGNOSIS — R200 Anesthesia of skin: Secondary | ICD-10-CM | POA: Diagnosis not present

## 2020-03-07 DIAGNOSIS — I4891 Unspecified atrial fibrillation: Secondary | ICD-10-CM | POA: Diagnosis not present

## 2020-03-07 DIAGNOSIS — I7774 Dissection of vertebral artery: Secondary | ICD-10-CM | POA: Diagnosis not present

## 2020-03-07 DIAGNOSIS — M542 Cervicalgia: Secondary | ICD-10-CM | POA: Diagnosis not present

## 2020-03-07 DIAGNOSIS — I214 Non-ST elevation (NSTEMI) myocardial infarction: Secondary | ICD-10-CM | POA: Diagnosis not present

## 2020-03-07 DIAGNOSIS — E2839 Other primary ovarian failure: Secondary | ICD-10-CM | POA: Diagnosis not present

## 2020-03-07 DIAGNOSIS — H548 Legal blindness, as defined in USA: Secondary | ICD-10-CM | POA: Diagnosis not present

## 2020-03-07 DIAGNOSIS — R1310 Dysphagia, unspecified: Secondary | ICD-10-CM | POA: Diagnosis not present

## 2020-03-07 DIAGNOSIS — Z6824 Body mass index (BMI) 24.0-24.9, adult: Secondary | ICD-10-CM | POA: Diagnosis not present

## 2020-03-07 DIAGNOSIS — M961 Postlaminectomy syndrome, not elsewhere classified: Secondary | ICD-10-CM | POA: Diagnosis not present

## 2020-03-07 DIAGNOSIS — F330 Major depressive disorder, recurrent, mild: Secondary | ICD-10-CM | POA: Diagnosis not present

## 2020-03-07 DIAGNOSIS — H01009 Unspecified blepharitis unspecified eye, unspecified eyelid: Secondary | ICD-10-CM | POA: Diagnosis not present

## 2020-03-07 DIAGNOSIS — N289 Disorder of kidney and ureter, unspecified: Secondary | ICD-10-CM | POA: Diagnosis not present

## 2020-03-07 DIAGNOSIS — M79674 Pain in right toe(s): Secondary | ICD-10-CM | POA: Diagnosis not present

## 2020-03-07 DIAGNOSIS — R05 Cough: Secondary | ICD-10-CM | POA: Diagnosis not present

## 2020-03-07 DIAGNOSIS — W228XXA Striking against or struck by other objects, initial encounter: Secondary | ICD-10-CM | POA: Diagnosis not present

## 2020-03-07 DIAGNOSIS — H353211 Exudative age-related macular degeneration, right eye, with active choroidal neovascularization: Secondary | ICD-10-CM | POA: Diagnosis not present

## 2020-03-07 DIAGNOSIS — E041 Nontoxic single thyroid nodule: Secondary | ICD-10-CM | POA: Diagnosis not present

## 2020-03-07 DIAGNOSIS — S066X9A Traumatic subarachnoid hemorrhage with loss of consciousness of unspecified duration, initial encounter: Secondary | ICD-10-CM | POA: Diagnosis not present

## 2020-03-07 DIAGNOSIS — M722 Plantar fascial fibromatosis: Secondary | ICD-10-CM | POA: Diagnosis not present

## 2020-03-07 DIAGNOSIS — Z87440 Personal history of urinary (tract) infections: Secondary | ICD-10-CM | POA: Diagnosis not present

## 2020-03-07 DIAGNOSIS — K3189 Other diseases of stomach and duodenum: Secondary | ICD-10-CM | POA: Diagnosis not present

## 2020-03-07 DIAGNOSIS — R062 Wheezing: Secondary | ICD-10-CM | POA: Diagnosis not present

## 2020-03-07 DIAGNOSIS — H019 Unspecified inflammation of eyelid: Secondary | ICD-10-CM | POA: Diagnosis not present

## 2020-03-07 DIAGNOSIS — R079 Chest pain, unspecified: Secondary | ICD-10-CM | POA: Diagnosis not present

## 2020-03-07 DIAGNOSIS — J342 Deviated nasal septum: Secondary | ICD-10-CM | POA: Diagnosis not present

## 2020-03-07 DIAGNOSIS — M25662 Stiffness of left knee, not elsewhere classified: Secondary | ICD-10-CM | POA: Diagnosis not present

## 2020-03-07 DIAGNOSIS — Z89231 Acquired absence of right shoulder: Secondary | ICD-10-CM | POA: Diagnosis not present

## 2020-03-07 DIAGNOSIS — B354 Tinea corporis: Secondary | ICD-10-CM | POA: Diagnosis not present

## 2020-03-07 DIAGNOSIS — Z5112 Encounter for antineoplastic immunotherapy: Secondary | ICD-10-CM | POA: Diagnosis not present

## 2020-03-07 DIAGNOSIS — M1812 Unilateral primary osteoarthritis of first carpometacarpal joint, left hand: Secondary | ICD-10-CM | POA: Diagnosis not present

## 2020-03-07 DIAGNOSIS — Z872 Personal history of diseases of the skin and subcutaneous tissue: Secondary | ICD-10-CM | POA: Diagnosis not present

## 2020-03-07 DIAGNOSIS — M7061 Trochanteric bursitis, right hip: Secondary | ICD-10-CM | POA: Diagnosis not present

## 2020-03-07 DIAGNOSIS — C9000 Multiple myeloma not having achieved remission: Secondary | ICD-10-CM | POA: Diagnosis not present

## 2020-03-07 DIAGNOSIS — Z720 Tobacco use: Secondary | ICD-10-CM | POA: Diagnosis not present

## 2020-03-07 DIAGNOSIS — M5033 Other cervical disc degeneration, cervicothoracic region: Secondary | ICD-10-CM | POA: Diagnosis not present

## 2020-03-07 DIAGNOSIS — R0609 Other forms of dyspnea: Secondary | ICD-10-CM | POA: Diagnosis not present

## 2020-03-07 DIAGNOSIS — Z95810 Presence of automatic (implantable) cardiac defibrillator: Secondary | ICD-10-CM | POA: Diagnosis not present

## 2020-03-07 DIAGNOSIS — M461 Sacroiliitis, not elsewhere classified: Secondary | ICD-10-CM | POA: Diagnosis not present

## 2020-03-07 DIAGNOSIS — C50011 Malignant neoplasm of nipple and areola, right female breast: Secondary | ICD-10-CM | POA: Diagnosis not present

## 2020-03-07 DIAGNOSIS — F339 Major depressive disorder, recurrent, unspecified: Secondary | ICD-10-CM | POA: Diagnosis not present

## 2020-03-07 DIAGNOSIS — R3915 Urgency of urination: Secondary | ICD-10-CM | POA: Diagnosis not present

## 2020-03-07 DIAGNOSIS — M62551 Muscle wasting and atrophy, not elsewhere classified, right thigh: Secondary | ICD-10-CM | POA: Diagnosis not present

## 2020-03-07 DIAGNOSIS — K644 Residual hemorrhoidal skin tags: Secondary | ICD-10-CM | POA: Diagnosis not present

## 2020-03-07 DIAGNOSIS — R319 Hematuria, unspecified: Secondary | ICD-10-CM | POA: Diagnosis not present

## 2020-03-07 DIAGNOSIS — M1990 Unspecified osteoarthritis, unspecified site: Secondary | ICD-10-CM | POA: Diagnosis not present

## 2020-03-07 DIAGNOSIS — C44229 Squamous cell carcinoma of skin of left ear and external auricular canal: Secondary | ICD-10-CM | POA: Diagnosis not present

## 2020-03-07 DIAGNOSIS — M19011 Primary osteoarthritis, right shoulder: Secondary | ICD-10-CM | POA: Diagnosis not present

## 2020-03-07 DIAGNOSIS — Z0001 Encounter for general adult medical examination with abnormal findings: Secondary | ICD-10-CM | POA: Diagnosis not present

## 2020-03-07 DIAGNOSIS — R1011 Right upper quadrant pain: Secondary | ICD-10-CM | POA: Diagnosis not present

## 2020-03-07 DIAGNOSIS — C73 Malignant neoplasm of thyroid gland: Secondary | ICD-10-CM | POA: Diagnosis not present

## 2020-03-07 DIAGNOSIS — H57813 Brow ptosis, bilateral: Secondary | ICD-10-CM | POA: Diagnosis not present

## 2020-03-07 DIAGNOSIS — B0229 Other postherpetic nervous system involvement: Secondary | ICD-10-CM | POA: Diagnosis not present

## 2020-03-07 DIAGNOSIS — Z9181 History of falling: Secondary | ICD-10-CM | POA: Diagnosis not present

## 2020-03-07 DIAGNOSIS — R7301 Impaired fasting glucose: Secondary | ICD-10-CM | POA: Diagnosis not present

## 2020-03-07 DIAGNOSIS — D479 Neoplasm of uncertain behavior of lymphoid, hematopoietic and related tissue, unspecified: Secondary | ICD-10-CM | POA: Diagnosis not present

## 2020-03-07 DIAGNOSIS — S70262A Insect bite (nonvenomous), left hip, initial encounter: Secondary | ICD-10-CM | POA: Diagnosis not present

## 2020-03-07 DIAGNOSIS — N3000 Acute cystitis without hematuria: Secondary | ICD-10-CM | POA: Diagnosis not present

## 2020-03-07 DIAGNOSIS — M25332 Other instability, left wrist: Secondary | ICD-10-CM | POA: Diagnosis not present

## 2020-03-07 DIAGNOSIS — H353123 Nonexudative age-related macular degeneration, left eye, advanced atrophic without subfoveal involvement: Secondary | ICD-10-CM | POA: Diagnosis not present

## 2020-03-07 DIAGNOSIS — I872 Venous insufficiency (chronic) (peripheral): Secondary | ICD-10-CM | POA: Diagnosis not present

## 2020-03-07 DIAGNOSIS — K743 Primary biliary cirrhosis: Secondary | ICD-10-CM | POA: Diagnosis not present

## 2020-03-07 DIAGNOSIS — M25511 Pain in right shoulder: Secondary | ICD-10-CM | POA: Diagnosis not present

## 2020-03-07 DIAGNOSIS — Z818 Family history of other mental and behavioral disorders: Secondary | ICD-10-CM | POA: Diagnosis not present

## 2020-03-07 DIAGNOSIS — D631 Anemia in chronic kidney disease: Secondary | ICD-10-CM | POA: Diagnosis not present

## 2020-03-07 DIAGNOSIS — J438 Other emphysema: Secondary | ICD-10-CM | POA: Diagnosis not present

## 2020-03-07 DIAGNOSIS — R310 Gross hematuria: Secondary | ICD-10-CM | POA: Diagnosis not present

## 2020-03-07 DIAGNOSIS — Z96651 Presence of right artificial knee joint: Secondary | ICD-10-CM | POA: Diagnosis not present

## 2020-03-07 DIAGNOSIS — M47817 Spondylosis without myelopathy or radiculopathy, lumbosacral region: Secondary | ICD-10-CM | POA: Diagnosis not present

## 2020-03-07 DIAGNOSIS — G478 Other sleep disorders: Secondary | ICD-10-CM | POA: Diagnosis not present

## 2020-03-07 DIAGNOSIS — Z96612 Presence of left artificial shoulder joint: Secondary | ICD-10-CM | POA: Diagnosis not present

## 2020-03-07 DIAGNOSIS — H5213 Myopia, bilateral: Secondary | ICD-10-CM | POA: Diagnosis not present

## 2020-03-07 DIAGNOSIS — R748 Abnormal levels of other serum enzymes: Secondary | ICD-10-CM | POA: Diagnosis not present

## 2020-03-07 DIAGNOSIS — Z789 Other specified health status: Secondary | ICD-10-CM | POA: Diagnosis not present

## 2020-03-07 DIAGNOSIS — R4781 Slurred speech: Secondary | ICD-10-CM | POA: Diagnosis not present

## 2020-03-07 DIAGNOSIS — L819 Disorder of pigmentation, unspecified: Secondary | ICD-10-CM | POA: Diagnosis not present

## 2020-03-07 DIAGNOSIS — I420 Dilated cardiomyopathy: Secondary | ICD-10-CM | POA: Diagnosis not present

## 2020-03-07 DIAGNOSIS — K219 Gastro-esophageal reflux disease without esophagitis: Secondary | ICD-10-CM | POA: Diagnosis not present

## 2020-03-07 DIAGNOSIS — L309 Dermatitis, unspecified: Secondary | ICD-10-CM | POA: Diagnosis not present

## 2020-03-07 DIAGNOSIS — K56609 Unspecified intestinal obstruction, unspecified as to partial versus complete obstruction: Secondary | ICD-10-CM | POA: Diagnosis not present

## 2020-03-07 DIAGNOSIS — L708 Other acne: Secondary | ICD-10-CM | POA: Diagnosis not present

## 2020-03-07 DIAGNOSIS — M170 Bilateral primary osteoarthritis of knee: Secondary | ICD-10-CM | POA: Diagnosis not present

## 2020-03-07 DIAGNOSIS — F05 Delirium due to known physiological condition: Secondary | ICD-10-CM | POA: Diagnosis not present

## 2020-03-07 DIAGNOSIS — R0600 Dyspnea, unspecified: Secondary | ICD-10-CM | POA: Diagnosis not present

## 2020-03-07 DIAGNOSIS — T8484XA Pain due to internal orthopedic prosthetic devices, implants and grafts, initial encounter: Secondary | ICD-10-CM | POA: Diagnosis not present

## 2020-03-07 DIAGNOSIS — R1031 Right lower quadrant pain: Secondary | ICD-10-CM | POA: Diagnosis not present

## 2020-03-07 DIAGNOSIS — R591 Generalized enlarged lymph nodes: Secondary | ICD-10-CM | POA: Diagnosis not present

## 2020-03-07 DIAGNOSIS — K5903 Drug induced constipation: Secondary | ICD-10-CM | POA: Diagnosis not present

## 2020-03-07 DIAGNOSIS — N183 Chronic kidney disease, stage 3 unspecified: Secondary | ICD-10-CM | POA: Diagnosis not present

## 2020-03-07 DIAGNOSIS — D0461 Carcinoma in situ of skin of right upper limb, including shoulder: Secondary | ICD-10-CM | POA: Diagnosis not present

## 2020-03-07 DIAGNOSIS — M4807 Spinal stenosis, lumbosacral region: Secondary | ICD-10-CM | POA: Diagnosis not present

## 2020-03-07 DIAGNOSIS — M5414 Radiculopathy, thoracic region: Secondary | ICD-10-CM | POA: Diagnosis not present

## 2020-03-07 DIAGNOSIS — J431 Panlobular emphysema: Secondary | ICD-10-CM | POA: Diagnosis not present

## 2020-03-07 DIAGNOSIS — M654 Radial styloid tenosynovitis [de Quervain]: Secondary | ICD-10-CM | POA: Diagnosis not present

## 2020-03-07 DIAGNOSIS — M8000XD Age-related osteoporosis with current pathological fracture, unspecified site, subsequent encounter for fracture with routine healing: Secondary | ICD-10-CM | POA: Diagnosis not present

## 2020-03-07 DIAGNOSIS — D649 Anemia, unspecified: Secondary | ICD-10-CM | POA: Diagnosis not present

## 2020-03-07 DIAGNOSIS — T82858A Stenosis of vascular prosthetic devices, implants and grafts, initial encounter: Secondary | ICD-10-CM | POA: Diagnosis not present

## 2020-03-07 DIAGNOSIS — F900 Attention-deficit hyperactivity disorder, predominantly inattentive type: Secondary | ICD-10-CM | POA: Diagnosis not present

## 2020-03-07 DIAGNOSIS — T781XXD Other adverse food reactions, not elsewhere classified, subsequent encounter: Secondary | ICD-10-CM | POA: Diagnosis not present

## 2020-03-07 DIAGNOSIS — N186 End stage renal disease: Secondary | ICD-10-CM | POA: Diagnosis not present

## 2020-03-07 DIAGNOSIS — G40909 Epilepsy, unspecified, not intractable, without status epilepticus: Secondary | ICD-10-CM | POA: Diagnosis not present

## 2020-03-07 DIAGNOSIS — N951 Menopausal and female climacteric states: Secondary | ICD-10-CM | POA: Diagnosis not present

## 2020-03-07 DIAGNOSIS — I890 Lymphedema, not elsewhere classified: Secondary | ICD-10-CM | POA: Diagnosis not present

## 2020-03-07 DIAGNOSIS — R413 Other amnesia: Secondary | ICD-10-CM | POA: Diagnosis not present

## 2020-03-07 DIAGNOSIS — M546 Pain in thoracic spine: Secondary | ICD-10-CM | POA: Diagnosis not present

## 2020-03-07 DIAGNOSIS — C44619 Basal cell carcinoma of skin of left upper limb, including shoulder: Secondary | ICD-10-CM | POA: Diagnosis not present

## 2020-03-07 DIAGNOSIS — M4847XD Fatigue fracture of vertebra, lumbosacral region, subsequent encounter for fracture with routine healing: Secondary | ICD-10-CM | POA: Diagnosis not present

## 2020-03-07 DIAGNOSIS — Z79890 Hormone replacement therapy (postmenopausal): Secondary | ICD-10-CM | POA: Diagnosis not present

## 2020-03-07 DIAGNOSIS — K5520 Angiodysplasia of colon without hemorrhage: Secondary | ICD-10-CM | POA: Diagnosis not present

## 2020-03-07 DIAGNOSIS — I5020 Unspecified systolic (congestive) heart failure: Secondary | ICD-10-CM | POA: Diagnosis not present

## 2020-03-07 DIAGNOSIS — E861 Hypovolemia: Secondary | ICD-10-CM | POA: Diagnosis not present

## 2020-03-07 DIAGNOSIS — D2271 Melanocytic nevi of right lower limb, including hip: Secondary | ICD-10-CM | POA: Diagnosis not present

## 2020-03-07 DIAGNOSIS — J4541 Moderate persistent asthma with (acute) exacerbation: Secondary | ICD-10-CM | POA: Diagnosis not present

## 2020-03-07 DIAGNOSIS — G894 Chronic pain syndrome: Secondary | ICD-10-CM | POA: Diagnosis not present

## 2020-03-07 DIAGNOSIS — S329XXD Fracture of unspecified parts of lumbosacral spine and pelvis, subsequent encounter for fracture with routine healing: Secondary | ICD-10-CM | POA: Diagnosis not present

## 2020-03-07 DIAGNOSIS — R4182 Altered mental status, unspecified: Secondary | ICD-10-CM | POA: Diagnosis not present

## 2020-03-07 DIAGNOSIS — L97211 Non-pressure chronic ulcer of right calf limited to breakdown of skin: Secondary | ICD-10-CM | POA: Diagnosis not present

## 2020-03-07 DIAGNOSIS — Z950 Presence of cardiac pacemaker: Secondary | ICD-10-CM | POA: Diagnosis not present

## 2020-03-07 DIAGNOSIS — R5383 Other fatigue: Secondary | ICD-10-CM | POA: Diagnosis not present

## 2020-03-07 DIAGNOSIS — R61 Generalized hyperhidrosis: Secondary | ICD-10-CM | POA: Diagnosis not present

## 2020-03-07 DIAGNOSIS — F17210 Nicotine dependence, cigarettes, uncomplicated: Secondary | ICD-10-CM | POA: Diagnosis not present

## 2020-03-07 DIAGNOSIS — G912 (Idiopathic) normal pressure hydrocephalus: Secondary | ICD-10-CM | POA: Diagnosis not present

## 2020-03-07 DIAGNOSIS — R0683 Snoring: Secondary | ICD-10-CM | POA: Diagnosis not present

## 2020-03-07 DIAGNOSIS — I82562 Chronic embolism and thrombosis of left calf muscular vein: Secondary | ICD-10-CM | POA: Diagnosis not present

## 2020-03-07 DIAGNOSIS — L400 Psoriasis vulgaris: Secondary | ICD-10-CM | POA: Diagnosis not present

## 2020-03-07 DIAGNOSIS — N6320 Unspecified lump in the left breast, unspecified quadrant: Secondary | ICD-10-CM | POA: Diagnosis not present

## 2020-03-07 DIAGNOSIS — U071 COVID-19: Secondary | ICD-10-CM | POA: Diagnosis not present

## 2020-03-07 DIAGNOSIS — Z803 Family history of malignant neoplasm of breast: Secondary | ICD-10-CM | POA: Diagnosis not present

## 2020-03-07 DIAGNOSIS — M869 Osteomyelitis, unspecified: Secondary | ICD-10-CM | POA: Diagnosis not present

## 2020-03-07 DIAGNOSIS — J984 Other disorders of lung: Secondary | ICD-10-CM | POA: Diagnosis not present

## 2020-03-07 DIAGNOSIS — Z1389 Encounter for screening for other disorder: Secondary | ICD-10-CM | POA: Diagnosis not present

## 2020-03-07 DIAGNOSIS — R1110 Vomiting, unspecified: Secondary | ICD-10-CM | POA: Diagnosis not present

## 2020-03-07 DIAGNOSIS — Z86010 Personal history of colonic polyps: Secondary | ICD-10-CM | POA: Diagnosis not present

## 2020-03-07 DIAGNOSIS — I63412 Cerebral infarction due to embolism of left middle cerebral artery: Secondary | ICD-10-CM | POA: Diagnosis not present

## 2020-03-07 DIAGNOSIS — H9202 Otalgia, left ear: Secondary | ICD-10-CM | POA: Diagnosis not present

## 2020-03-07 DIAGNOSIS — H6983 Other specified disorders of Eustachian tube, bilateral: Secondary | ICD-10-CM | POA: Diagnosis not present

## 2020-03-07 DIAGNOSIS — J439 Emphysema, unspecified: Secondary | ICD-10-CM | POA: Diagnosis not present

## 2020-03-07 DIAGNOSIS — E8881 Metabolic syndrome: Secondary | ICD-10-CM | POA: Diagnosis not present

## 2020-03-07 DIAGNOSIS — C8599 Non-Hodgkin lymphoma, unspecified, extranodal and solid organ sites: Secondary | ICD-10-CM | POA: Diagnosis not present

## 2020-03-07 DIAGNOSIS — R627 Adult failure to thrive: Secondary | ICD-10-CM | POA: Diagnosis not present

## 2020-03-07 DIAGNOSIS — I2721 Secondary pulmonary arterial hypertension: Secondary | ICD-10-CM | POA: Diagnosis not present

## 2020-03-07 DIAGNOSIS — I639 Cerebral infarction, unspecified: Secondary | ICD-10-CM | POA: Diagnosis not present

## 2020-03-07 DIAGNOSIS — H353112 Nonexudative age-related macular degeneration, right eye, intermediate dry stage: Secondary | ICD-10-CM | POA: Diagnosis not present

## 2020-03-07 DIAGNOSIS — K5909 Other constipation: Secondary | ICD-10-CM | POA: Diagnosis not present

## 2020-03-07 DIAGNOSIS — M109 Gout, unspecified: Secondary | ICD-10-CM | POA: Diagnosis not present

## 2020-03-07 DIAGNOSIS — R0689 Other abnormalities of breathing: Secondary | ICD-10-CM | POA: Diagnosis not present

## 2020-03-07 DIAGNOSIS — G8918 Other acute postprocedural pain: Secondary | ICD-10-CM | POA: Diagnosis not present

## 2020-03-07 DIAGNOSIS — I110 Hypertensive heart disease with heart failure: Secondary | ICD-10-CM | POA: Diagnosis not present

## 2020-03-07 DIAGNOSIS — I509 Heart failure, unspecified: Secondary | ICD-10-CM | POA: Diagnosis not present

## 2020-03-07 DIAGNOSIS — H04123 Dry eye syndrome of bilateral lacrimal glands: Secondary | ICD-10-CM | POA: Diagnosis not present

## 2020-03-07 DIAGNOSIS — S0501XA Injury of conjunctiva and corneal abrasion without foreign body, right eye, initial encounter: Secondary | ICD-10-CM | POA: Diagnosis not present

## 2020-03-07 DIAGNOSIS — H524 Presbyopia: Secondary | ICD-10-CM | POA: Diagnosis not present

## 2020-03-07 DIAGNOSIS — F410 Panic disorder [episodic paroxysmal anxiety] without agoraphobia: Secondary | ICD-10-CM | POA: Diagnosis not present

## 2020-03-07 DIAGNOSIS — K429 Umbilical hernia without obstruction or gangrene: Secondary | ICD-10-CM | POA: Diagnosis not present

## 2020-03-07 DIAGNOSIS — J9601 Acute respiratory failure with hypoxia: Secondary | ICD-10-CM | POA: Diagnosis not present

## 2020-03-07 DIAGNOSIS — L2089 Other atopic dermatitis: Secondary | ICD-10-CM | POA: Diagnosis not present

## 2020-03-07 DIAGNOSIS — Z881 Allergy status to other antibiotic agents status: Secondary | ICD-10-CM | POA: Diagnosis not present

## 2020-03-07 DIAGNOSIS — K7460 Unspecified cirrhosis of liver: Secondary | ICD-10-CM | POA: Diagnosis not present

## 2020-03-07 DIAGNOSIS — C44529 Squamous cell carcinoma of skin of other part of trunk: Secondary | ICD-10-CM | POA: Diagnosis not present

## 2020-03-07 DIAGNOSIS — H35341 Macular cyst, hole, or pseudohole, right eye: Secondary | ICD-10-CM | POA: Diagnosis not present

## 2020-03-07 DIAGNOSIS — C3411 Malignant neoplasm of upper lobe, right bronchus or lung: Secondary | ICD-10-CM | POA: Diagnosis not present

## 2020-03-07 DIAGNOSIS — I252 Old myocardial infarction: Secondary | ICD-10-CM | POA: Diagnosis not present

## 2020-03-07 DIAGNOSIS — Z6833 Body mass index (BMI) 33.0-33.9, adult: Secondary | ICD-10-CM | POA: Diagnosis not present

## 2020-03-07 DIAGNOSIS — S12112D Nondisplaced Type II dens fracture, subsequent encounter for fracture with routine healing: Secondary | ICD-10-CM | POA: Diagnosis not present

## 2020-03-07 DIAGNOSIS — N6314 Unspecified lump in the right breast, lower inner quadrant: Secondary | ICD-10-CM | POA: Diagnosis not present

## 2020-03-07 DIAGNOSIS — Z08 Encounter for follow-up examination after completed treatment for malignant neoplasm: Secondary | ICD-10-CM | POA: Diagnosis not present

## 2020-03-07 DIAGNOSIS — L600 Ingrowing nail: Secondary | ICD-10-CM | POA: Diagnosis not present

## 2020-03-07 DIAGNOSIS — G8929 Other chronic pain: Secondary | ICD-10-CM | POA: Diagnosis not present

## 2020-03-07 DIAGNOSIS — M069 Rheumatoid arthritis, unspecified: Secondary | ICD-10-CM | POA: Diagnosis not present

## 2020-03-07 DIAGNOSIS — S92322D Displaced fracture of second metatarsal bone, left foot, subsequent encounter for fracture with routine healing: Secondary | ICD-10-CM | POA: Diagnosis not present

## 2020-03-07 DIAGNOSIS — R160 Hepatomegaly, not elsewhere classified: Secondary | ICD-10-CM | POA: Diagnosis not present

## 2020-03-07 DIAGNOSIS — F0280 Dementia in other diseases classified elsewhere without behavioral disturbance: Secondary | ICD-10-CM | POA: Diagnosis not present

## 2020-03-07 DIAGNOSIS — F3341 Major depressive disorder, recurrent, in partial remission: Secondary | ICD-10-CM | POA: Diagnosis not present

## 2020-03-07 DIAGNOSIS — M5432 Sciatica, left side: Secondary | ICD-10-CM | POA: Diagnosis not present

## 2020-03-07 DIAGNOSIS — C538 Malignant neoplasm of overlapping sites of cervix uteri: Secondary | ICD-10-CM | POA: Diagnosis not present

## 2020-03-07 DIAGNOSIS — S42215A Unspecified nondisplaced fracture of surgical neck of left humerus, initial encounter for closed fracture: Secondary | ICD-10-CM | POA: Diagnosis not present

## 2020-03-07 DIAGNOSIS — K56600 Partial intestinal obstruction, unspecified as to cause: Secondary | ICD-10-CM | POA: Diagnosis not present

## 2020-03-07 DIAGNOSIS — C44311 Basal cell carcinoma of skin of nose: Secondary | ICD-10-CM | POA: Diagnosis not present

## 2020-03-07 DIAGNOSIS — I491 Atrial premature depolarization: Secondary | ICD-10-CM | POA: Diagnosis not present

## 2020-03-07 DIAGNOSIS — K862 Cyst of pancreas: Secondary | ICD-10-CM | POA: Diagnosis not present

## 2020-03-07 DIAGNOSIS — I69398 Other sequelae of cerebral infarction: Secondary | ICD-10-CM | POA: Diagnosis not present

## 2020-03-07 DIAGNOSIS — N3281 Overactive bladder: Secondary | ICD-10-CM | POA: Diagnosis not present

## 2020-03-07 DIAGNOSIS — N3941 Urge incontinence: Secondary | ICD-10-CM | POA: Diagnosis not present

## 2020-03-07 DIAGNOSIS — H02413 Mechanical ptosis of bilateral eyelids: Secondary | ICD-10-CM | POA: Diagnosis not present

## 2020-03-07 DIAGNOSIS — H43393 Other vitreous opacities, bilateral: Secondary | ICD-10-CM | POA: Diagnosis not present

## 2020-03-07 DIAGNOSIS — I351 Nonrheumatic aortic (valve) insufficiency: Secondary | ICD-10-CM | POA: Diagnosis not present

## 2020-03-07 DIAGNOSIS — J61 Pneumoconiosis due to asbestos and other mineral fibers: Secondary | ICD-10-CM | POA: Diagnosis not present

## 2020-03-07 DIAGNOSIS — F3289 Other specified depressive episodes: Secondary | ICD-10-CM | POA: Diagnosis not present

## 2020-03-07 DIAGNOSIS — K5900 Constipation, unspecified: Secondary | ICD-10-CM | POA: Diagnosis not present

## 2020-03-07 DIAGNOSIS — L89311 Pressure ulcer of right buttock, stage 1: Secondary | ICD-10-CM | POA: Diagnosis not present

## 2020-03-07 DIAGNOSIS — I700 Atherosclerosis of aorta: Secondary | ICD-10-CM | POA: Diagnosis not present

## 2020-03-07 DIAGNOSIS — N3020 Other chronic cystitis without hematuria: Secondary | ICD-10-CM | POA: Diagnosis not present

## 2020-03-07 DIAGNOSIS — I6381 Other cerebral infarction due to occlusion or stenosis of small artery: Secondary | ICD-10-CM | POA: Diagnosis not present

## 2020-03-07 DIAGNOSIS — S0003XA Contusion of scalp, initial encounter: Secondary | ICD-10-CM | POA: Diagnosis not present

## 2020-03-07 DIAGNOSIS — M150 Primary generalized (osteo)arthritis: Secondary | ICD-10-CM | POA: Diagnosis not present

## 2020-03-07 DIAGNOSIS — R739 Hyperglycemia, unspecified: Secondary | ICD-10-CM | POA: Diagnosis not present

## 2020-03-07 DIAGNOSIS — M79671 Pain in right foot: Secondary | ICD-10-CM | POA: Diagnosis not present

## 2020-03-07 DIAGNOSIS — Z85850 Personal history of malignant neoplasm of thyroid: Secondary | ICD-10-CM | POA: Diagnosis not present

## 2020-03-07 DIAGNOSIS — R21 Rash and other nonspecific skin eruption: Secondary | ICD-10-CM | POA: Diagnosis not present

## 2020-03-07 DIAGNOSIS — K648 Other hemorrhoids: Secondary | ICD-10-CM | POA: Diagnosis not present

## 2020-03-07 DIAGNOSIS — S61451S Open bite of right hand, sequela: Secondary | ICD-10-CM | POA: Diagnosis not present

## 2020-03-07 DIAGNOSIS — M5126 Other intervertebral disc displacement, lumbar region: Secondary | ICD-10-CM | POA: Diagnosis not present

## 2020-03-07 DIAGNOSIS — I83812 Varicose veins of left lower extremities with pain: Secondary | ICD-10-CM | POA: Diagnosis not present

## 2020-03-07 DIAGNOSIS — D099 Carcinoma in situ, unspecified: Secondary | ICD-10-CM | POA: Diagnosis not present

## 2020-03-07 DIAGNOSIS — C9110 Chronic lymphocytic leukemia of B-cell type not having achieved remission: Secondary | ICD-10-CM | POA: Diagnosis not present

## 2020-03-07 DIAGNOSIS — M818 Other osteoporosis without current pathological fracture: Secondary | ICD-10-CM | POA: Diagnosis not present

## 2020-03-07 DIAGNOSIS — M85872 Other specified disorders of bone density and structure, left ankle and foot: Secondary | ICD-10-CM | POA: Diagnosis not present

## 2020-03-07 DIAGNOSIS — M3500 Sicca syndrome, unspecified: Secondary | ICD-10-CM | POA: Diagnosis not present

## 2020-03-07 DIAGNOSIS — M2041 Other hammer toe(s) (acquired), right foot: Secondary | ICD-10-CM | POA: Diagnosis not present

## 2020-03-07 DIAGNOSIS — L03116 Cellulitis of left lower limb: Secondary | ICD-10-CM | POA: Diagnosis not present

## 2020-03-07 DIAGNOSIS — M545 Low back pain: Secondary | ICD-10-CM | POA: Diagnosis not present

## 2020-03-07 DIAGNOSIS — H40003 Preglaucoma, unspecified, bilateral: Secondary | ICD-10-CM | POA: Diagnosis not present

## 2020-03-07 DIAGNOSIS — L02419 Cutaneous abscess of limb, unspecified: Secondary | ICD-10-CM | POA: Diagnosis not present

## 2020-03-07 DIAGNOSIS — Z87891 Personal history of nicotine dependence: Secondary | ICD-10-CM | POA: Diagnosis not present

## 2020-03-07 DIAGNOSIS — C50912 Malignant neoplasm of unspecified site of left female breast: Secondary | ICD-10-CM | POA: Diagnosis not present

## 2020-03-07 DIAGNOSIS — G479 Sleep disorder, unspecified: Secondary | ICD-10-CM | POA: Diagnosis not present

## 2020-03-07 DIAGNOSIS — L280 Lichen simplex chronicus: Secondary | ICD-10-CM | POA: Diagnosis not present

## 2020-03-07 DIAGNOSIS — C61 Malignant neoplasm of prostate: Secondary | ICD-10-CM | POA: Diagnosis not present

## 2020-03-07 DIAGNOSIS — H33321 Round hole, right eye: Secondary | ICD-10-CM | POA: Diagnosis not present

## 2020-03-07 DIAGNOSIS — M1612 Unilateral primary osteoarthritis, left hip: Secondary | ICD-10-CM | POA: Diagnosis not present

## 2020-03-07 DIAGNOSIS — F5105 Insomnia due to other mental disorder: Secondary | ICD-10-CM | POA: Diagnosis not present

## 2020-03-07 DIAGNOSIS — F331 Major depressive disorder, recurrent, moderate: Secondary | ICD-10-CM | POA: Diagnosis not present

## 2020-03-07 DIAGNOSIS — M5134 Other intervertebral disc degeneration, thoracic region: Secondary | ICD-10-CM | POA: Diagnosis not present

## 2020-03-07 DIAGNOSIS — R338 Other retention of urine: Secondary | ICD-10-CM | POA: Diagnosis not present

## 2020-03-07 DIAGNOSIS — Z953 Presence of xenogenic heart valve: Secondary | ICD-10-CM | POA: Diagnosis not present

## 2020-03-07 DIAGNOSIS — K7030 Alcoholic cirrhosis of liver without ascites: Secondary | ICD-10-CM | POA: Diagnosis not present

## 2020-03-07 DIAGNOSIS — Z6831 Body mass index (BMI) 31.0-31.9, adult: Secondary | ICD-10-CM | POA: Diagnosis not present

## 2020-03-07 DIAGNOSIS — H353221 Exudative age-related macular degeneration, left eye, with active choroidal neovascularization: Secondary | ICD-10-CM | POA: Diagnosis not present

## 2020-03-07 DIAGNOSIS — Z5111 Encounter for antineoplastic chemotherapy: Secondary | ICD-10-CM | POA: Diagnosis not present

## 2020-03-07 DIAGNOSIS — R4020 Unspecified coma: Secondary | ICD-10-CM | POA: Diagnosis not present

## 2020-03-07 DIAGNOSIS — H5211 Myopia, right eye: Secondary | ICD-10-CM | POA: Diagnosis not present

## 2020-03-07 DIAGNOSIS — R972 Elevated prostate specific antigen [PSA]: Secondary | ICD-10-CM | POA: Diagnosis not present

## 2020-03-07 DIAGNOSIS — S066X0D Traumatic subarachnoid hemorrhage without loss of consciousness, subsequent encounter: Secondary | ICD-10-CM | POA: Diagnosis not present

## 2020-03-07 DIAGNOSIS — M79672 Pain in left foot: Secondary | ICD-10-CM | POA: Diagnosis not present

## 2020-03-07 DIAGNOSIS — I2722 Pulmonary hypertension due to left heart disease: Secondary | ICD-10-CM | POA: Diagnosis not present

## 2020-03-07 DIAGNOSIS — N281 Cyst of kidney, acquired: Secondary | ICD-10-CM | POA: Diagnosis not present

## 2020-03-07 DIAGNOSIS — I5043 Acute on chronic combined systolic (congestive) and diastolic (congestive) heart failure: Secondary | ICD-10-CM | POA: Diagnosis not present

## 2020-03-07 DIAGNOSIS — K589 Irritable bowel syndrome without diarrhea: Secondary | ICD-10-CM | POA: Diagnosis not present

## 2020-03-07 DIAGNOSIS — H3341 Traction detachment of retina, right eye: Secondary | ICD-10-CM | POA: Diagnosis not present

## 2020-03-07 DIAGNOSIS — S8012XA Contusion of left lower leg, initial encounter: Secondary | ICD-10-CM | POA: Diagnosis not present

## 2020-03-07 DIAGNOSIS — B029 Zoster without complications: Secondary | ICD-10-CM | POA: Diagnosis not present

## 2020-03-07 DIAGNOSIS — I5033 Acute on chronic diastolic (congestive) heart failure: Secondary | ICD-10-CM | POA: Diagnosis not present

## 2020-03-07 DIAGNOSIS — S46001D Unspecified injury of muscle(s) and tendon(s) of the rotator cuff of right shoulder, subsequent encounter: Secondary | ICD-10-CM | POA: Diagnosis not present

## 2020-03-07 DIAGNOSIS — S46012D Strain of muscle(s) and tendon(s) of the rotator cuff of left shoulder, subsequent encounter: Secondary | ICD-10-CM | POA: Diagnosis not present

## 2020-03-07 DIAGNOSIS — D638 Anemia in other chronic diseases classified elsewhere: Secondary | ICD-10-CM | POA: Diagnosis not present

## 2020-03-07 DIAGNOSIS — M5412 Radiculopathy, cervical region: Secondary | ICD-10-CM | POA: Diagnosis not present

## 2020-03-07 DIAGNOSIS — M8448XD Pathological fracture, other site, subsequent encounter for fracture with routine healing: Secondary | ICD-10-CM | POA: Diagnosis not present

## 2020-03-07 DIAGNOSIS — D492 Neoplasm of unspecified behavior of bone, soft tissue, and skin: Secondary | ICD-10-CM | POA: Diagnosis not present

## 2020-03-07 DIAGNOSIS — Z6826 Body mass index (BMI) 26.0-26.9, adult: Secondary | ICD-10-CM | POA: Diagnosis not present

## 2020-03-07 DIAGNOSIS — F603 Borderline personality disorder: Secondary | ICD-10-CM | POA: Diagnosis not present

## 2020-03-07 DIAGNOSIS — H52223 Regular astigmatism, bilateral: Secondary | ICD-10-CM | POA: Diagnosis not present

## 2020-03-07 DIAGNOSIS — Z981 Arthrodesis status: Secondary | ICD-10-CM | POA: Diagnosis not present

## 2020-03-07 DIAGNOSIS — K2980 Duodenitis without bleeding: Secondary | ICD-10-CM | POA: Diagnosis not present

## 2020-03-07 DIAGNOSIS — G301 Alzheimer's disease with late onset: Secondary | ICD-10-CM | POA: Diagnosis not present

## 2020-03-07 DIAGNOSIS — R208 Other disturbances of skin sensation: Secondary | ICD-10-CM | POA: Diagnosis not present

## 2020-03-07 DIAGNOSIS — R944 Abnormal results of kidney function studies: Secondary | ICD-10-CM | POA: Diagnosis not present

## 2020-03-07 DIAGNOSIS — H35363 Drusen (degenerative) of macula, bilateral: Secondary | ICD-10-CM | POA: Diagnosis not present

## 2020-03-07 DIAGNOSIS — C772 Secondary and unspecified malignant neoplasm of intra-abdominal lymph nodes: Secondary | ICD-10-CM | POA: Diagnosis not present

## 2020-03-07 DIAGNOSIS — S43422A Sprain of left rotator cuff capsule, initial encounter: Secondary | ICD-10-CM | POA: Diagnosis not present

## 2020-03-07 DIAGNOSIS — R3121 Asymptomatic microscopic hematuria: Secondary | ICD-10-CM | POA: Diagnosis not present

## 2020-03-07 DIAGNOSIS — R922 Inconclusive mammogram: Secondary | ICD-10-CM | POA: Diagnosis not present

## 2020-03-07 DIAGNOSIS — L570 Actinic keratosis: Secondary | ICD-10-CM | POA: Diagnosis not present

## 2020-03-07 DIAGNOSIS — R600 Localized edema: Secondary | ICD-10-CM | POA: Diagnosis not present

## 2020-03-07 DIAGNOSIS — S86912A Strain of unspecified muscle(s) and tendon(s) at lower leg level, left leg, initial encounter: Secondary | ICD-10-CM | POA: Diagnosis not present

## 2020-03-07 DIAGNOSIS — E113213 Type 2 diabetes mellitus with mild nonproliferative diabetic retinopathy with macular edema, bilateral: Secondary | ICD-10-CM | POA: Diagnosis not present

## 2020-03-07 DIAGNOSIS — E0842 Diabetes mellitus due to underlying condition with diabetic polyneuropathy: Secondary | ICD-10-CM | POA: Diagnosis not present

## 2020-03-07 DIAGNOSIS — I6782 Cerebral ischemia: Secondary | ICD-10-CM | POA: Diagnosis not present

## 2020-03-07 DIAGNOSIS — E782 Mixed hyperlipidemia: Secondary | ICD-10-CM | POA: Diagnosis not present

## 2020-03-07 DIAGNOSIS — F411 Generalized anxiety disorder: Secondary | ICD-10-CM | POA: Diagnosis not present

## 2020-03-07 DIAGNOSIS — F515 Nightmare disorder: Secondary | ICD-10-CM | POA: Diagnosis not present

## 2020-03-07 DIAGNOSIS — I255 Ischemic cardiomyopathy: Secondary | ICD-10-CM | POA: Diagnosis not present

## 2020-03-07 DIAGNOSIS — I609 Nontraumatic subarachnoid hemorrhage, unspecified: Secondary | ICD-10-CM | POA: Diagnosis not present

## 2020-03-07 DIAGNOSIS — H6123 Impacted cerumen, bilateral: Secondary | ICD-10-CM | POA: Diagnosis not present

## 2020-03-07 DIAGNOSIS — H5203 Hypermetropia, bilateral: Secondary | ICD-10-CM | POA: Diagnosis not present

## 2020-03-07 DIAGNOSIS — R262 Difficulty in walking, not elsewhere classified: Secondary | ICD-10-CM | POA: Diagnosis not present

## 2020-03-07 DIAGNOSIS — R6520 Severe sepsis without septic shock: Secondary | ICD-10-CM | POA: Diagnosis not present

## 2020-03-07 DIAGNOSIS — J019 Acute sinusitis, unspecified: Secondary | ICD-10-CM | POA: Diagnosis not present

## 2020-03-07 DIAGNOSIS — M359 Systemic involvement of connective tissue, unspecified: Secondary | ICD-10-CM | POA: Diagnosis not present

## 2020-03-07 DIAGNOSIS — R072 Precordial pain: Secondary | ICD-10-CM | POA: Diagnosis not present

## 2020-03-07 DIAGNOSIS — J849 Interstitial pulmonary disease, unspecified: Secondary | ICD-10-CM | POA: Diagnosis not present

## 2020-03-07 DIAGNOSIS — Z48816 Encounter for surgical aftercare following surgery on the genitourinary system: Secondary | ICD-10-CM | POA: Diagnosis not present

## 2020-03-07 DIAGNOSIS — S61215A Laceration without foreign body of left ring finger without damage to nail, initial encounter: Secondary | ICD-10-CM | POA: Diagnosis not present

## 2020-03-07 DIAGNOSIS — F438 Other reactions to severe stress: Secondary | ICD-10-CM | POA: Diagnosis not present

## 2020-03-07 DIAGNOSIS — E1159 Type 2 diabetes mellitus with other circulatory complications: Secondary | ICD-10-CM | POA: Diagnosis not present

## 2020-03-07 DIAGNOSIS — L97312 Non-pressure chronic ulcer of right ankle with fat layer exposed: Secondary | ICD-10-CM | POA: Diagnosis not present

## 2020-03-07 DIAGNOSIS — I517 Cardiomegaly: Secondary | ICD-10-CM | POA: Diagnosis not present

## 2020-03-07 DIAGNOSIS — S299XXA Unspecified injury of thorax, initial encounter: Secondary | ICD-10-CM | POA: Diagnosis not present

## 2020-03-07 DIAGNOSIS — F17201 Nicotine dependence, unspecified, in remission: Secondary | ICD-10-CM | POA: Diagnosis not present

## 2020-03-07 DIAGNOSIS — G4710 Hypersomnia, unspecified: Secondary | ICD-10-CM | POA: Diagnosis not present

## 2020-03-07 DIAGNOSIS — E2740 Unspecified adrenocortical insufficiency: Secondary | ICD-10-CM | POA: Diagnosis not present

## 2020-03-07 DIAGNOSIS — I2782 Chronic pulmonary embolism: Secondary | ICD-10-CM | POA: Diagnosis not present

## 2020-03-07 DIAGNOSIS — M9903 Segmental and somatic dysfunction of lumbar region: Secondary | ICD-10-CM | POA: Diagnosis not present

## 2020-03-07 DIAGNOSIS — K056 Periodontal disease, unspecified: Secondary | ICD-10-CM | POA: Diagnosis not present

## 2020-03-07 DIAGNOSIS — F325 Major depressive disorder, single episode, in full remission: Secondary | ICD-10-CM | POA: Diagnosis not present

## 2020-03-07 DIAGNOSIS — I442 Atrioventricular block, complete: Secondary | ICD-10-CM | POA: Diagnosis not present

## 2020-03-07 DIAGNOSIS — R110 Nausea: Secondary | ICD-10-CM | POA: Diagnosis not present

## 2020-03-07 DIAGNOSIS — K319 Disease of stomach and duodenum, unspecified: Secondary | ICD-10-CM | POA: Diagnosis not present

## 2020-03-07 DIAGNOSIS — Z7409 Other reduced mobility: Secondary | ICD-10-CM | POA: Diagnosis not present

## 2020-03-07 DIAGNOSIS — F4310 Post-traumatic stress disorder, unspecified: Secondary | ICD-10-CM | POA: Diagnosis not present

## 2020-03-07 DIAGNOSIS — K922 Gastrointestinal hemorrhage, unspecified: Secondary | ICD-10-CM | POA: Diagnosis not present

## 2020-03-07 DIAGNOSIS — M1009 Idiopathic gout, multiple sites: Secondary | ICD-10-CM | POA: Diagnosis not present

## 2020-03-07 DIAGNOSIS — F5104 Psychophysiologic insomnia: Secondary | ICD-10-CM | POA: Diagnosis not present

## 2020-03-07 DIAGNOSIS — M87051 Idiopathic aseptic necrosis of right femur: Secondary | ICD-10-CM | POA: Diagnosis not present

## 2020-03-07 DIAGNOSIS — N138 Other obstructive and reflux uropathy: Secondary | ICD-10-CM | POA: Diagnosis not present

## 2020-03-07 DIAGNOSIS — I359 Nonrheumatic aortic valve disorder, unspecified: Secondary | ICD-10-CM | POA: Diagnosis not present

## 2020-03-07 DIAGNOSIS — Z1339 Encounter for screening examination for other mental health and behavioral disorders: Secondary | ICD-10-CM | POA: Diagnosis not present

## 2020-03-07 DIAGNOSIS — M89552 Osteolysis, left thigh: Secondary | ICD-10-CM | POA: Diagnosis not present

## 2020-03-07 DIAGNOSIS — M25532 Pain in left wrist: Secondary | ICD-10-CM | POA: Diagnosis not present

## 2020-03-07 DIAGNOSIS — M7732 Calcaneal spur, left foot: Secondary | ICD-10-CM | POA: Diagnosis not present

## 2020-03-07 DIAGNOSIS — H1030 Unspecified acute conjunctivitis, unspecified eye: Secondary | ICD-10-CM | POA: Diagnosis not present

## 2020-03-07 DIAGNOSIS — M85851 Other specified disorders of bone density and structure, right thigh: Secondary | ICD-10-CM | POA: Diagnosis not present

## 2020-03-07 DIAGNOSIS — K5732 Diverticulitis of large intestine without perforation or abscess without bleeding: Secondary | ICD-10-CM | POA: Diagnosis not present

## 2020-03-07 DIAGNOSIS — K200 Eosinophilic esophagitis: Secondary | ICD-10-CM | POA: Diagnosis not present

## 2020-03-07 DIAGNOSIS — E049 Nontoxic goiter, unspecified: Secondary | ICD-10-CM | POA: Diagnosis not present

## 2020-03-07 DIAGNOSIS — H35013 Changes in retinal vascular appearance, bilateral: Secondary | ICD-10-CM | POA: Diagnosis not present

## 2020-03-07 DIAGNOSIS — F209 Schizophrenia, unspecified: Secondary | ICD-10-CM | POA: Diagnosis not present

## 2020-03-07 DIAGNOSIS — S336XXA Sprain of sacroiliac joint, initial encounter: Secondary | ICD-10-CM | POA: Diagnosis not present

## 2020-03-07 DIAGNOSIS — F1190 Opioid use, unspecified, uncomplicated: Secondary | ICD-10-CM | POA: Diagnosis not present

## 2020-03-07 DIAGNOSIS — K222 Esophageal obstruction: Secondary | ICD-10-CM | POA: Diagnosis not present

## 2020-03-07 DIAGNOSIS — R1013 Epigastric pain: Secondary | ICD-10-CM | POA: Diagnosis not present

## 2020-03-07 DIAGNOSIS — C50812 Malignant neoplasm of overlapping sites of left female breast: Secondary | ICD-10-CM | POA: Diagnosis not present

## 2020-03-07 DIAGNOSIS — R3129 Other microscopic hematuria: Secondary | ICD-10-CM | POA: Diagnosis not present

## 2020-03-07 DIAGNOSIS — N6489 Other specified disorders of breast: Secondary | ICD-10-CM | POA: Diagnosis not present

## 2020-03-07 DIAGNOSIS — E11610 Type 2 diabetes mellitus with diabetic neuropathic arthropathy: Secondary | ICD-10-CM | POA: Diagnosis not present

## 2020-03-07 DIAGNOSIS — Z5321 Procedure and treatment not carried out due to patient leaving prior to being seen by health care provider: Secondary | ICD-10-CM | POA: Diagnosis not present

## 2020-03-07 DIAGNOSIS — H9193 Unspecified hearing loss, bilateral: Secondary | ICD-10-CM | POA: Diagnosis not present

## 2020-03-07 DIAGNOSIS — F324 Major depressive disorder, single episode, in partial remission: Secondary | ICD-10-CM | POA: Diagnosis not present

## 2020-03-07 DIAGNOSIS — D6869 Other thrombophilia: Secondary | ICD-10-CM | POA: Diagnosis not present

## 2020-03-07 DIAGNOSIS — I708 Atherosclerosis of other arteries: Secondary | ICD-10-CM | POA: Diagnosis not present

## 2020-03-07 DIAGNOSIS — R569 Unspecified convulsions: Secondary | ICD-10-CM | POA: Diagnosis not present

## 2020-03-07 DIAGNOSIS — I6340 Cerebral infarction due to embolism of unspecified cerebral artery: Secondary | ICD-10-CM | POA: Diagnosis not present

## 2020-03-07 DIAGNOSIS — Z8669 Personal history of other diseases of the nervous system and sense organs: Secondary | ICD-10-CM | POA: Diagnosis not present

## 2020-03-07 DIAGNOSIS — D2372 Other benign neoplasm of skin of left lower limb, including hip: Secondary | ICD-10-CM | POA: Diagnosis not present

## 2020-03-07 DIAGNOSIS — J301 Allergic rhinitis due to pollen: Secondary | ICD-10-CM | POA: Diagnosis not present

## 2020-03-07 DIAGNOSIS — G35 Multiple sclerosis: Secondary | ICD-10-CM | POA: Diagnosis not present

## 2020-03-07 DIAGNOSIS — K5901 Slow transit constipation: Secondary | ICD-10-CM | POA: Diagnosis not present

## 2020-03-07 DIAGNOSIS — S8256XD Nondisplaced fracture of medial malleolus of unspecified tibia, subsequent encounter for closed fracture with routine healing: Secondary | ICD-10-CM | POA: Diagnosis not present

## 2020-03-07 DIAGNOSIS — D72819 Decreased white blood cell count, unspecified: Secondary | ICD-10-CM | POA: Diagnosis not present

## 2020-03-07 DIAGNOSIS — T07XXXA Unspecified multiple injuries, initial encounter: Secondary | ICD-10-CM | POA: Diagnosis not present

## 2020-03-07 DIAGNOSIS — H35371 Puckering of macula, right eye: Secondary | ICD-10-CM | POA: Diagnosis not present

## 2020-03-07 DIAGNOSIS — I69351 Hemiplegia and hemiparesis following cerebral infarction affecting right dominant side: Secondary | ICD-10-CM | POA: Diagnosis not present

## 2020-03-07 DIAGNOSIS — K4030 Unilateral inguinal hernia, with obstruction, without gangrene, not specified as recurrent: Secondary | ICD-10-CM | POA: Diagnosis not present

## 2020-03-07 DIAGNOSIS — R899 Unspecified abnormal finding in specimens from other organs, systems and tissues: Secondary | ICD-10-CM | POA: Diagnosis not present

## 2020-03-07 DIAGNOSIS — H61303 Acquired stenosis of external ear canal, unspecified, bilateral: Secondary | ICD-10-CM | POA: Diagnosis not present

## 2020-03-07 DIAGNOSIS — Z515 Encounter for palliative care: Secondary | ICD-10-CM | POA: Diagnosis not present

## 2020-03-07 DIAGNOSIS — E063 Autoimmune thyroiditis: Secondary | ICD-10-CM | POA: Diagnosis not present

## 2020-03-07 DIAGNOSIS — M659 Synovitis and tenosynovitis, unspecified: Secondary | ICD-10-CM | POA: Diagnosis not present

## 2020-03-07 DIAGNOSIS — F1911 Other psychoactive substance abuse, in remission: Secondary | ICD-10-CM | POA: Diagnosis not present

## 2020-03-07 DIAGNOSIS — M810 Age-related osteoporosis without current pathological fracture: Secondary | ICD-10-CM | POA: Diagnosis not present

## 2020-03-07 DIAGNOSIS — K432 Incisional hernia without obstruction or gangrene: Secondary | ICD-10-CM | POA: Diagnosis not present

## 2020-03-07 DIAGNOSIS — Z825 Family history of asthma and other chronic lower respiratory diseases: Secondary | ICD-10-CM | POA: Diagnosis not present

## 2020-03-07 DIAGNOSIS — Z96652 Presence of left artificial knee joint: Secondary | ICD-10-CM | POA: Diagnosis not present

## 2020-03-07 DIAGNOSIS — F3160 Bipolar disorder, current episode mixed, unspecified: Secondary | ICD-10-CM | POA: Diagnosis not present

## 2020-03-07 DIAGNOSIS — H15002 Unspecified scleritis, left eye: Secondary | ICD-10-CM | POA: Diagnosis not present

## 2020-03-07 DIAGNOSIS — F3113 Bipolar disorder, current episode manic without psychotic features, severe: Secondary | ICD-10-CM | POA: Diagnosis not present

## 2020-03-07 DIAGNOSIS — H7011 Chronic mastoiditis, right ear: Secondary | ICD-10-CM | POA: Diagnosis not present

## 2020-03-07 DIAGNOSIS — Z923 Personal history of irradiation: Secondary | ICD-10-CM | POA: Diagnosis not present

## 2020-03-07 DIAGNOSIS — M2012 Hallux valgus (acquired), left foot: Secondary | ICD-10-CM | POA: Diagnosis not present

## 2020-03-07 DIAGNOSIS — I493 Ventricular premature depolarization: Secondary | ICD-10-CM | POA: Diagnosis not present

## 2020-03-07 DIAGNOSIS — H18422 Band keratopathy, left eye: Secondary | ICD-10-CM | POA: Diagnosis not present

## 2020-03-07 DIAGNOSIS — J479 Bronchiectasis, uncomplicated: Secondary | ICD-10-CM | POA: Diagnosis not present

## 2020-03-07 DIAGNOSIS — R6889 Other general symptoms and signs: Secondary | ICD-10-CM | POA: Diagnosis not present

## 2020-03-07 DIAGNOSIS — C652 Malignant neoplasm of left renal pelvis: Secondary | ICD-10-CM | POA: Diagnosis not present

## 2020-03-07 DIAGNOSIS — Z8543 Personal history of malignant neoplasm of ovary: Secondary | ICD-10-CM | POA: Diagnosis not present

## 2020-03-07 DIAGNOSIS — H35033 Hypertensive retinopathy, bilateral: Secondary | ICD-10-CM | POA: Diagnosis not present

## 2020-03-07 DIAGNOSIS — D367 Benign neoplasm of other specified sites: Secondary | ICD-10-CM | POA: Diagnosis not present

## 2020-03-07 DIAGNOSIS — D039 Melanoma in situ, unspecified: Secondary | ICD-10-CM | POA: Diagnosis not present

## 2020-03-07 DIAGNOSIS — D0511 Intraductal carcinoma in situ of right breast: Secondary | ICD-10-CM | POA: Diagnosis not present

## 2020-03-07 DIAGNOSIS — Z89421 Acquired absence of other right toe(s): Secondary | ICD-10-CM | POA: Diagnosis not present

## 2020-03-07 DIAGNOSIS — N39498 Other specified urinary incontinence: Secondary | ICD-10-CM | POA: Diagnosis not present

## 2020-03-07 DIAGNOSIS — Z6827 Body mass index (BMI) 27.0-27.9, adult: Secondary | ICD-10-CM | POA: Diagnosis not present

## 2020-03-07 DIAGNOSIS — Z906 Acquired absence of other parts of urinary tract: Secondary | ICD-10-CM | POA: Diagnosis not present

## 2020-03-07 DIAGNOSIS — K7689 Other specified diseases of liver: Secondary | ICD-10-CM | POA: Diagnosis not present

## 2020-03-07 DIAGNOSIS — R399 Unspecified symptoms and signs involving the genitourinary system: Secondary | ICD-10-CM | POA: Diagnosis not present

## 2020-03-07 DIAGNOSIS — R4189 Other symptoms and signs involving cognitive functions and awareness: Secondary | ICD-10-CM | POA: Diagnosis not present

## 2020-03-07 DIAGNOSIS — R490 Dysphonia: Secondary | ICD-10-CM | POA: Diagnosis not present

## 2020-03-07 DIAGNOSIS — R803 Bence Jones proteinuria: Secondary | ICD-10-CM | POA: Diagnosis not present

## 2020-03-07 DIAGNOSIS — C50412 Malignant neoplasm of upper-outer quadrant of left female breast: Secondary | ICD-10-CM | POA: Diagnosis not present

## 2020-03-07 DIAGNOSIS — M7060 Trochanteric bursitis, unspecified hip: Secondary | ICD-10-CM | POA: Diagnosis not present

## 2020-03-07 DIAGNOSIS — S199XXA Unspecified injury of neck, initial encounter: Secondary | ICD-10-CM | POA: Diagnosis not present

## 2020-03-07 DIAGNOSIS — M436 Torticollis: Secondary | ICD-10-CM | POA: Diagnosis not present

## 2020-03-07 DIAGNOSIS — G609 Hereditary and idiopathic neuropathy, unspecified: Secondary | ICD-10-CM | POA: Diagnosis not present

## 2020-03-07 DIAGNOSIS — J4540 Moderate persistent asthma, uncomplicated: Secondary | ICD-10-CM | POA: Diagnosis not present

## 2020-03-07 DIAGNOSIS — Z66 Do not resuscitate: Secondary | ICD-10-CM | POA: Diagnosis not present

## 2020-03-07 DIAGNOSIS — R42 Dizziness and giddiness: Secondary | ICD-10-CM | POA: Diagnosis not present

## 2020-03-07 DIAGNOSIS — M1991 Primary osteoarthritis, unspecified site: Secondary | ICD-10-CM | POA: Diagnosis not present

## 2020-03-07 DIAGNOSIS — C50911 Malignant neoplasm of unspecified site of right female breast: Secondary | ICD-10-CM | POA: Diagnosis not present

## 2020-03-07 DIAGNOSIS — N179 Acute kidney failure, unspecified: Secondary | ICD-10-CM | POA: Diagnosis not present

## 2020-03-07 DIAGNOSIS — J40 Bronchitis, not specified as acute or chronic: Secondary | ICD-10-CM | POA: Diagnosis not present

## 2020-03-07 DIAGNOSIS — S069X9A Unspecified intracranial injury with loss of consciousness of unspecified duration, initial encounter: Secondary | ICD-10-CM | POA: Diagnosis not present

## 2020-03-07 DIAGNOSIS — G629 Polyneuropathy, unspecified: Secondary | ICD-10-CM | POA: Diagnosis not present

## 2020-03-07 DIAGNOSIS — D045 Carcinoma in situ of skin of trunk: Secondary | ICD-10-CM | POA: Diagnosis not present

## 2020-03-07 DIAGNOSIS — H353132 Nonexudative age-related macular degeneration, bilateral, intermediate dry stage: Secondary | ICD-10-CM | POA: Diagnosis not present

## 2020-03-07 DIAGNOSIS — R410 Disorientation, unspecified: Secondary | ICD-10-CM | POA: Diagnosis not present

## 2020-03-07 DIAGNOSIS — Z91038 Other insect allergy status: Secondary | ICD-10-CM | POA: Diagnosis not present

## 2020-03-07 DIAGNOSIS — N1831 Chronic kidney disease, stage 3a: Secondary | ICD-10-CM | POA: Diagnosis not present

## 2020-03-07 DIAGNOSIS — H5010 Unspecified exotropia: Secondary | ICD-10-CM | POA: Diagnosis not present

## 2020-03-07 DIAGNOSIS — J383 Other diseases of vocal cords: Secondary | ICD-10-CM | POA: Diagnosis not present

## 2020-03-07 DIAGNOSIS — H44001 Unspecified purulent endophthalmitis, right eye: Secondary | ICD-10-CM | POA: Diagnosis not present

## 2020-03-07 DIAGNOSIS — M797 Fibromyalgia: Secondary | ICD-10-CM | POA: Diagnosis not present

## 2020-03-07 DIAGNOSIS — R002 Palpitations: Secondary | ICD-10-CM | POA: Diagnosis not present

## 2020-03-07 DIAGNOSIS — E611 Iron deficiency: Secondary | ICD-10-CM | POA: Diagnosis not present

## 2020-03-07 DIAGNOSIS — R29818 Other symptoms and signs involving the nervous system: Secondary | ICD-10-CM | POA: Diagnosis not present

## 2020-03-07 DIAGNOSIS — E7800 Pure hypercholesterolemia, unspecified: Secondary | ICD-10-CM | POA: Diagnosis not present

## 2020-03-07 DIAGNOSIS — R1111 Vomiting without nausea: Secondary | ICD-10-CM | POA: Diagnosis not present

## 2020-03-07 DIAGNOSIS — H35432 Paving stone degeneration of retina, left eye: Secondary | ICD-10-CM | POA: Diagnosis not present

## 2020-03-07 DIAGNOSIS — N200 Calculus of kidney: Secondary | ICD-10-CM | POA: Diagnosis not present

## 2020-03-07 DIAGNOSIS — Z8042 Family history of malignant neoplasm of prostate: Secondary | ICD-10-CM | POA: Diagnosis not present

## 2020-03-07 DIAGNOSIS — I5032 Chronic diastolic (congestive) heart failure: Secondary | ICD-10-CM | POA: Diagnosis not present

## 2020-03-07 DIAGNOSIS — R8290 Unspecified abnormal findings in urine: Secondary | ICD-10-CM | POA: Diagnosis not present

## 2020-03-07 DIAGNOSIS — L814 Other melanin hyperpigmentation: Secondary | ICD-10-CM | POA: Diagnosis not present

## 2020-03-07 DIAGNOSIS — M999 Biomechanical lesion, unspecified: Secondary | ICD-10-CM | POA: Diagnosis not present

## 2020-03-07 DIAGNOSIS — N390 Urinary tract infection, site not specified: Secondary | ICD-10-CM | POA: Diagnosis not present

## 2020-03-07 DIAGNOSIS — I259 Chronic ischemic heart disease, unspecified: Secondary | ICD-10-CM | POA: Diagnosis not present

## 2020-03-07 DIAGNOSIS — F1010 Alcohol abuse, uncomplicated: Secondary | ICD-10-CM | POA: Diagnosis not present

## 2020-03-07 DIAGNOSIS — R2689 Other abnormalities of gait and mobility: Secondary | ICD-10-CM | POA: Diagnosis not present

## 2020-03-07 DIAGNOSIS — S39012A Strain of muscle, fascia and tendon of lower back, initial encounter: Secondary | ICD-10-CM | POA: Diagnosis not present

## 2020-03-07 DIAGNOSIS — H35373 Puckering of macula, bilateral: Secondary | ICD-10-CM | POA: Diagnosis not present

## 2020-03-07 DIAGNOSIS — M75101 Unspecified rotator cuff tear or rupture of right shoulder, not specified as traumatic: Secondary | ICD-10-CM | POA: Diagnosis not present

## 2020-03-07 DIAGNOSIS — E860 Dehydration: Secondary | ICD-10-CM | POA: Diagnosis not present

## 2020-03-07 DIAGNOSIS — K581 Irritable bowel syndrome with constipation: Secondary | ICD-10-CM | POA: Diagnosis not present

## 2020-03-07 DIAGNOSIS — D62 Acute posthemorrhagic anemia: Secondary | ICD-10-CM | POA: Diagnosis not present

## 2020-03-07 DIAGNOSIS — R945 Abnormal results of liver function studies: Secondary | ICD-10-CM | POA: Diagnosis not present

## 2020-03-07 DIAGNOSIS — H3562 Retinal hemorrhage, left eye: Secondary | ICD-10-CM | POA: Diagnosis not present

## 2020-03-07 DIAGNOSIS — M65311 Trigger thumb, right thumb: Secondary | ICD-10-CM | POA: Diagnosis not present

## 2020-03-07 DIAGNOSIS — N61 Mastitis without abscess: Secondary | ICD-10-CM | POA: Diagnosis not present

## 2020-03-07 DIAGNOSIS — I130 Hypertensive heart and chronic kidney disease with heart failure and stage 1 through stage 4 chronic kidney disease, or unspecified chronic kidney disease: Secondary | ICD-10-CM | POA: Diagnosis not present

## 2020-03-07 DIAGNOSIS — G4700 Insomnia, unspecified: Secondary | ICD-10-CM | POA: Diagnosis not present

## 2020-03-07 DIAGNOSIS — G3183 Dementia with Lewy bodies: Secondary | ICD-10-CM | POA: Diagnosis not present

## 2020-03-07 DIAGNOSIS — H02423 Myogenic ptosis of bilateral eyelids: Secondary | ICD-10-CM | POA: Diagnosis not present

## 2020-03-07 DIAGNOSIS — Z886 Allergy status to analgesic agent status: Secondary | ICD-10-CM | POA: Diagnosis not present

## 2020-03-07 DIAGNOSIS — Z7901 Long term (current) use of anticoagulants: Secondary | ICD-10-CM | POA: Diagnosis not present

## 2020-03-07 DIAGNOSIS — R1312 Dysphagia, oropharyngeal phase: Secondary | ICD-10-CM | POA: Diagnosis not present

## 2020-03-07 DIAGNOSIS — C642 Malignant neoplasm of left kidney, except renal pelvis: Secondary | ICD-10-CM | POA: Diagnosis not present

## 2020-03-07 DIAGNOSIS — R159 Full incontinence of feces: Secondary | ICD-10-CM | POA: Diagnosis not present

## 2020-03-07 DIAGNOSIS — K224 Dyskinesia of esophagus: Secondary | ICD-10-CM | POA: Diagnosis not present

## 2020-03-07 DIAGNOSIS — D3A00 Benign carcinoid tumor of unspecified site: Secondary | ICD-10-CM | POA: Diagnosis not present

## 2020-03-07 DIAGNOSIS — M353 Polymyalgia rheumatica: Secondary | ICD-10-CM | POA: Diagnosis not present

## 2020-03-07 DIAGNOSIS — D6861 Antiphospholipid syndrome: Secondary | ICD-10-CM | POA: Diagnosis not present

## 2020-03-07 DIAGNOSIS — G4730 Sleep apnea, unspecified: Secondary | ICD-10-CM | POA: Diagnosis not present

## 2020-03-07 DIAGNOSIS — H00024 Hordeolum internum left upper eyelid: Secondary | ICD-10-CM | POA: Diagnosis not present

## 2020-03-07 DIAGNOSIS — Z96641 Presence of right artificial hip joint: Secondary | ICD-10-CM | POA: Diagnosis not present

## 2020-03-07 DIAGNOSIS — T733XXA Exhaustion due to excessive exertion, initial encounter: Secondary | ICD-10-CM | POA: Diagnosis not present

## 2020-03-07 DIAGNOSIS — Z6828 Body mass index (BMI) 28.0-28.9, adult: Secondary | ICD-10-CM | POA: Diagnosis not present

## 2020-03-07 DIAGNOSIS — R4701 Aphasia: Secondary | ICD-10-CM | POA: Diagnosis not present

## 2020-03-07 DIAGNOSIS — G40911 Epilepsy, unspecified, intractable, with status epilepticus: Secondary | ICD-10-CM | POA: Diagnosis not present

## 2020-03-07 DIAGNOSIS — Z136 Encounter for screening for cardiovascular disorders: Secondary | ICD-10-CM | POA: Diagnosis not present

## 2020-03-07 DIAGNOSIS — Z933 Colostomy status: Secondary | ICD-10-CM | POA: Diagnosis not present

## 2020-03-07 DIAGNOSIS — Z681 Body mass index (BMI) 19 or less, adult: Secondary | ICD-10-CM | POA: Diagnosis not present

## 2020-03-07 DIAGNOSIS — M5440 Lumbago with sciatica, unspecified side: Secondary | ICD-10-CM | POA: Diagnosis not present

## 2020-03-07 DIAGNOSIS — R82998 Other abnormal findings in urine: Secondary | ICD-10-CM | POA: Diagnosis not present

## 2020-03-07 DIAGNOSIS — Z955 Presence of coronary angioplasty implant and graft: Secondary | ICD-10-CM | POA: Diagnosis not present

## 2020-03-07 DIAGNOSIS — L578 Other skin changes due to chronic exposure to nonionizing radiation: Secondary | ICD-10-CM | POA: Diagnosis not present

## 2020-03-07 DIAGNOSIS — S42202D Unspecified fracture of upper end of left humerus, subsequent encounter for fracture with routine healing: Secondary | ICD-10-CM | POA: Diagnosis not present

## 2020-03-07 DIAGNOSIS — Z471 Aftercare following joint replacement surgery: Secondary | ICD-10-CM | POA: Diagnosis not present

## 2020-03-07 DIAGNOSIS — L308 Other specified dermatitis: Secondary | ICD-10-CM | POA: Diagnosis not present

## 2020-03-07 DIAGNOSIS — K2100 Gastro-esophageal reflux disease with esophagitis, without bleeding: Secondary | ICD-10-CM | POA: Diagnosis not present

## 2020-03-07 DIAGNOSIS — L84 Corns and callosities: Secondary | ICD-10-CM | POA: Diagnosis not present

## 2020-03-07 DIAGNOSIS — M25761 Osteophyte, right knee: Secondary | ICD-10-CM | POA: Diagnosis not present

## 2020-03-07 DIAGNOSIS — R202 Paresthesia of skin: Secondary | ICD-10-CM | POA: Diagnosis not present

## 2020-03-07 DIAGNOSIS — I129 Hypertensive chronic kidney disease with stage 1 through stage 4 chronic kidney disease, or unspecified chronic kidney disease: Secondary | ICD-10-CM | POA: Diagnosis not present

## 2020-03-07 DIAGNOSIS — M8730 Other secondary osteonecrosis, unspecified bone: Secondary | ICD-10-CM | POA: Diagnosis not present

## 2020-03-07 DIAGNOSIS — E11649 Type 2 diabetes mellitus with hypoglycemia without coma: Secondary | ICD-10-CM | POA: Diagnosis not present

## 2020-03-07 DIAGNOSIS — M7541 Impingement syndrome of right shoulder: Secondary | ICD-10-CM | POA: Diagnosis not present

## 2020-03-07 DIAGNOSIS — M1711 Unilateral primary osteoarthritis, right knee: Secondary | ICD-10-CM | POA: Diagnosis not present

## 2020-03-07 DIAGNOSIS — R29898 Other symptoms and signs involving the musculoskeletal system: Secondary | ICD-10-CM | POA: Diagnosis not present

## 2020-03-07 DIAGNOSIS — Z8701 Personal history of pneumonia (recurrent): Secondary | ICD-10-CM | POA: Diagnosis not present

## 2020-03-07 DIAGNOSIS — L821 Other seborrheic keratosis: Secondary | ICD-10-CM | POA: Diagnosis not present

## 2020-03-07 DIAGNOSIS — E7849 Other hyperlipidemia: Secondary | ICD-10-CM | POA: Diagnosis not present

## 2020-03-07 DIAGNOSIS — C50311 Malignant neoplasm of lower-inner quadrant of right female breast: Secondary | ICD-10-CM | POA: Diagnosis not present

## 2020-03-07 DIAGNOSIS — Z947 Corneal transplant status: Secondary | ICD-10-CM | POA: Diagnosis not present

## 2020-03-07 DIAGNOSIS — M4012 Other secondary kyphosis, cervical region: Secondary | ICD-10-CM | POA: Diagnosis not present

## 2020-03-07 DIAGNOSIS — Z8719 Personal history of other diseases of the digestive system: Secondary | ICD-10-CM | POA: Diagnosis not present

## 2020-03-07 DIAGNOSIS — M21611 Bunion of right foot: Secondary | ICD-10-CM | POA: Diagnosis not present

## 2020-03-07 DIAGNOSIS — K5904 Chronic idiopathic constipation: Secondary | ICD-10-CM | POA: Diagnosis not present

## 2020-03-07 DIAGNOSIS — L918 Other hypertrophic disorders of the skin: Secondary | ICD-10-CM | POA: Diagnosis not present

## 2020-03-07 DIAGNOSIS — H25813 Combined forms of age-related cataract, bilateral: Secondary | ICD-10-CM | POA: Diagnosis not present

## 2020-03-07 DIAGNOSIS — D81818 Other biotin-dependent carboxylase deficiency: Secondary | ICD-10-CM | POA: Diagnosis not present

## 2020-03-07 DIAGNOSIS — G5602 Carpal tunnel syndrome, left upper limb: Secondary | ICD-10-CM | POA: Diagnosis not present

## 2020-03-07 DIAGNOSIS — Z4682 Encounter for fitting and adjustment of non-vascular catheter: Secondary | ICD-10-CM | POA: Diagnosis not present

## 2020-03-07 DIAGNOSIS — Z01818 Encounter for other preprocedural examination: Secondary | ICD-10-CM | POA: Diagnosis not present

## 2020-03-07 DIAGNOSIS — M50123 Cervical disc disorder at C6-C7 level with radiculopathy: Secondary | ICD-10-CM | POA: Diagnosis not present

## 2020-03-07 DIAGNOSIS — Z8572 Personal history of non-Hodgkin lymphomas: Secondary | ICD-10-CM | POA: Diagnosis not present

## 2020-03-07 DIAGNOSIS — F3342 Major depressive disorder, recurrent, in full remission: Secondary | ICD-10-CM | POA: Diagnosis not present

## 2020-03-07 DIAGNOSIS — H02055 Trichiasis without entropian left lower eyelid: Secondary | ICD-10-CM | POA: Diagnosis not present

## 2020-03-07 DIAGNOSIS — Z823 Family history of stroke: Secondary | ICD-10-CM | POA: Diagnosis not present

## 2020-03-07 DIAGNOSIS — R188 Other ascites: Secondary | ICD-10-CM | POA: Diagnosis not present

## 2020-03-07 DIAGNOSIS — Z9189 Other specified personal risk factors, not elsewhere classified: Secondary | ICD-10-CM | POA: Diagnosis not present

## 2020-03-07 DIAGNOSIS — H43812 Vitreous degeneration, left eye: Secondary | ICD-10-CM | POA: Diagnosis not present

## 2020-03-07 DIAGNOSIS — R9389 Abnormal findings on diagnostic imaging of other specified body structures: Secondary | ICD-10-CM | POA: Diagnosis not present

## 2020-03-07 DIAGNOSIS — Z1152 Encounter for screening for COVID-19: Secondary | ICD-10-CM | POA: Diagnosis not present

## 2020-03-07 DIAGNOSIS — R9431 Abnormal electrocardiogram [ECG] [EKG]: Secondary | ICD-10-CM | POA: Diagnosis not present

## 2020-03-07 DIAGNOSIS — N182 Chronic kidney disease, stage 2 (mild): Secondary | ICD-10-CM | POA: Diagnosis not present

## 2020-03-07 DIAGNOSIS — M25519 Pain in unspecified shoulder: Secondary | ICD-10-CM | POA: Diagnosis not present

## 2020-03-07 DIAGNOSIS — C770 Secondary and unspecified malignant neoplasm of lymph nodes of head, face and neck: Secondary | ICD-10-CM | POA: Diagnosis not present

## 2020-03-07 DIAGNOSIS — R441 Visual hallucinations: Secondary | ICD-10-CM | POA: Diagnosis not present

## 2020-03-07 DIAGNOSIS — H6093 Unspecified otitis externa, bilateral: Secondary | ICD-10-CM | POA: Diagnosis not present

## 2020-03-07 DIAGNOSIS — M5442 Lumbago with sciatica, left side: Secondary | ICD-10-CM | POA: Diagnosis not present

## 2020-03-07 DIAGNOSIS — H9190 Unspecified hearing loss, unspecified ear: Secondary | ICD-10-CM | POA: Diagnosis not present

## 2020-03-07 DIAGNOSIS — I712 Thoracic aortic aneurysm, without rupture: Secondary | ICD-10-CM | POA: Diagnosis not present

## 2020-03-07 DIAGNOSIS — M25561 Pain in right knee: Secondary | ICD-10-CM | POA: Diagnosis not present

## 2020-03-07 DIAGNOSIS — I69318 Other symptoms and signs involving cognitive functions following cerebral infarction: Secondary | ICD-10-CM | POA: Diagnosis not present

## 2020-03-07 DIAGNOSIS — Q230 Congenital stenosis of aortic valve: Secondary | ICD-10-CM | POA: Diagnosis not present

## 2020-03-07 DIAGNOSIS — I5042 Chronic combined systolic (congestive) and diastolic (congestive) heart failure: Secondary | ICD-10-CM | POA: Diagnosis not present

## 2020-03-07 DIAGNOSIS — I120 Hypertensive chronic kidney disease with stage 5 chronic kidney disease or end stage renal disease: Secondary | ICD-10-CM | POA: Diagnosis not present

## 2020-03-07 DIAGNOSIS — I161 Hypertensive emergency: Secondary | ICD-10-CM | POA: Diagnosis not present

## 2020-03-07 DIAGNOSIS — R519 Headache, unspecified: Secondary | ICD-10-CM | POA: Diagnosis not present

## 2020-03-07 DIAGNOSIS — H1789 Other corneal scars and opacities: Secondary | ICD-10-CM | POA: Diagnosis not present

## 2020-03-07 DIAGNOSIS — S42202A Unspecified fracture of upper end of left humerus, initial encounter for closed fracture: Secondary | ICD-10-CM | POA: Diagnosis not present

## 2020-03-07 DIAGNOSIS — M5417 Radiculopathy, lumbosacral region: Secondary | ICD-10-CM | POA: Diagnosis not present

## 2020-03-07 DIAGNOSIS — Z1231 Encounter for screening mammogram for malignant neoplasm of breast: Secondary | ICD-10-CM | POA: Diagnosis not present

## 2020-03-07 DIAGNOSIS — R39198 Other difficulties with micturition: Secondary | ICD-10-CM | POA: Diagnosis not present

## 2020-03-07 DIAGNOSIS — K449 Diaphragmatic hernia without obstruction or gangrene: Secondary | ICD-10-CM | POA: Diagnosis not present

## 2020-03-07 DIAGNOSIS — F4321 Adjustment disorder with depressed mood: Secondary | ICD-10-CM | POA: Diagnosis not present

## 2020-03-07 DIAGNOSIS — I5030 Unspecified diastolic (congestive) heart failure: Secondary | ICD-10-CM | POA: Diagnosis not present

## 2020-03-07 DIAGNOSIS — R609 Edema, unspecified: Secondary | ICD-10-CM | POA: Diagnosis not present

## 2020-03-07 DIAGNOSIS — C44329 Squamous cell carcinoma of skin of other parts of face: Secondary | ICD-10-CM | POA: Diagnosis not present

## 2020-03-07 DIAGNOSIS — H6532 Chronic mucoid otitis media, left ear: Secondary | ICD-10-CM | POA: Diagnosis not present

## 2020-03-07 DIAGNOSIS — C44319 Basal cell carcinoma of skin of other parts of face: Secondary | ICD-10-CM | POA: Diagnosis not present

## 2020-03-07 DIAGNOSIS — R000 Tachycardia, unspecified: Secondary | ICD-10-CM | POA: Diagnosis not present

## 2020-03-07 DIAGNOSIS — K900 Celiac disease: Secondary | ICD-10-CM | POA: Diagnosis not present

## 2020-03-07 DIAGNOSIS — H6122 Impacted cerumen, left ear: Secondary | ICD-10-CM | POA: Diagnosis not present

## 2020-03-07 DIAGNOSIS — Z01812 Encounter for preprocedural laboratory examination: Secondary | ICD-10-CM | POA: Diagnosis not present

## 2020-03-07 DIAGNOSIS — M9904 Segmental and somatic dysfunction of sacral region: Secondary | ICD-10-CM | POA: Diagnosis not present

## 2020-03-07 DIAGNOSIS — F329 Major depressive disorder, single episode, unspecified: Secondary | ICD-10-CM | POA: Diagnosis not present

## 2020-03-07 DIAGNOSIS — S143XXD Injury of brachial plexus, subsequent encounter: Secondary | ICD-10-CM | POA: Diagnosis not present

## 2020-03-07 DIAGNOSIS — J811 Chronic pulmonary edema: Secondary | ICD-10-CM | POA: Diagnosis not present

## 2020-03-07 DIAGNOSIS — Z8774 Personal history of (corrected) congenital malformations of heart and circulatory system: Secondary | ICD-10-CM | POA: Diagnosis not present

## 2020-03-07 DIAGNOSIS — M1A0720 Idiopathic chronic gout, left ankle and foot, without tophus (tophi): Secondary | ICD-10-CM | POA: Diagnosis not present

## 2020-03-07 DIAGNOSIS — H9201 Otalgia, right ear: Secondary | ICD-10-CM | POA: Diagnosis not present

## 2020-03-07 DIAGNOSIS — C160 Malignant neoplasm of cardia: Secondary | ICD-10-CM | POA: Diagnosis not present

## 2020-03-07 DIAGNOSIS — N401 Enlarged prostate with lower urinary tract symptoms: Secondary | ICD-10-CM | POA: Diagnosis not present

## 2020-03-07 DIAGNOSIS — I5022 Chronic systolic (congestive) heart failure: Secondary | ICD-10-CM | POA: Diagnosis not present

## 2020-03-07 DIAGNOSIS — N529 Male erectile dysfunction, unspecified: Secondary | ICD-10-CM | POA: Diagnosis not present

## 2020-03-07 DIAGNOSIS — R030 Elevated blood-pressure reading, without diagnosis of hypertension: Secondary | ICD-10-CM | POA: Diagnosis not present

## 2020-03-07 DIAGNOSIS — Z79899 Other long term (current) drug therapy: Secondary | ICD-10-CM | POA: Diagnosis not present

## 2020-03-07 DIAGNOSIS — F0281 Dementia in other diseases classified elsewhere with behavioral disturbance: Secondary | ICD-10-CM | POA: Diagnosis not present

## 2020-03-07 DIAGNOSIS — J3081 Allergic rhinitis due to animal (cat) (dog) hair and dander: Secondary | ICD-10-CM | POA: Diagnosis not present

## 2020-03-07 DIAGNOSIS — Z95828 Presence of other vascular implants and grafts: Secondary | ICD-10-CM | POA: Diagnosis not present

## 2020-03-07 DIAGNOSIS — K228 Other specified diseases of esophagus: Secondary | ICD-10-CM | POA: Diagnosis not present

## 2020-03-07 DIAGNOSIS — H1820 Unspecified corneal edema: Secondary | ICD-10-CM | POA: Diagnosis not present

## 2020-03-07 DIAGNOSIS — Z862 Personal history of diseases of the blood and blood-forming organs and certain disorders involving the immune mechanism: Secondary | ICD-10-CM | POA: Diagnosis not present

## 2020-03-07 DIAGNOSIS — N184 Chronic kidney disease, stage 4 (severe): Secondary | ICD-10-CM | POA: Diagnosis not present

## 2020-03-07 DIAGNOSIS — Z6822 Body mass index (BMI) 22.0-22.9, adult: Secondary | ICD-10-CM | POA: Diagnosis not present

## 2020-03-07 DIAGNOSIS — K5669 Other partial intestinal obstruction: Secondary | ICD-10-CM | POA: Diagnosis not present

## 2020-03-07 DIAGNOSIS — M79605 Pain in left leg: Secondary | ICD-10-CM | POA: Diagnosis not present

## 2020-03-07 DIAGNOSIS — N12 Tubulo-interstitial nephritis, not specified as acute or chronic: Secondary | ICD-10-CM | POA: Diagnosis not present

## 2020-03-07 DIAGNOSIS — R001 Bradycardia, unspecified: Secondary | ICD-10-CM | POA: Diagnosis not present

## 2020-03-07 DIAGNOSIS — M5386 Other specified dorsopathies, lumbar region: Secondary | ICD-10-CM | POA: Diagnosis not present

## 2020-03-07 DIAGNOSIS — R942 Abnormal results of pulmonary function studies: Secondary | ICD-10-CM | POA: Diagnosis not present

## 2020-03-07 DIAGNOSIS — Z6829 Body mass index (BMI) 29.0-29.9, adult: Secondary | ICD-10-CM | POA: Diagnosis not present

## 2020-03-07 DIAGNOSIS — R269 Unspecified abnormalities of gait and mobility: Secondary | ICD-10-CM | POA: Diagnosis not present

## 2020-03-07 DIAGNOSIS — L03031 Cellulitis of right toe: Secondary | ICD-10-CM | POA: Diagnosis not present

## 2020-03-07 DIAGNOSIS — S066X0A Traumatic subarachnoid hemorrhage without loss of consciousness, initial encounter: Secondary | ICD-10-CM | POA: Diagnosis not present

## 2020-03-07 DIAGNOSIS — H02831 Dermatochalasis of right upper eyelid: Secondary | ICD-10-CM | POA: Diagnosis not present

## 2020-03-07 DIAGNOSIS — J3089 Other allergic rhinitis: Secondary | ICD-10-CM | POA: Diagnosis not present

## 2020-03-07 DIAGNOSIS — F1020 Alcohol dependence, uncomplicated: Secondary | ICD-10-CM | POA: Diagnosis not present

## 2020-03-07 DIAGNOSIS — G454 Transient global amnesia: Secondary | ICD-10-CM | POA: Diagnosis not present

## 2020-03-07 DIAGNOSIS — R4689 Other symptoms and signs involving appearance and behavior: Secondary | ICD-10-CM | POA: Diagnosis not present

## 2020-03-07 DIAGNOSIS — K4090 Unilateral inguinal hernia, without obstruction or gangrene, not specified as recurrent: Secondary | ICD-10-CM | POA: Diagnosis not present

## 2020-03-07 DIAGNOSIS — H34812 Central retinal vein occlusion, left eye, with macular edema: Secondary | ICD-10-CM | POA: Diagnosis not present

## 2020-03-07 DIAGNOSIS — M5136 Other intervertebral disc degeneration, lumbar region: Secondary | ICD-10-CM | POA: Diagnosis not present

## 2020-03-07 DIAGNOSIS — M179 Osteoarthritis of knee, unspecified: Secondary | ICD-10-CM | POA: Diagnosis not present

## 2020-03-07 DIAGNOSIS — E11319 Type 2 diabetes mellitus with unspecified diabetic retinopathy without macular edema: Secondary | ICD-10-CM | POA: Diagnosis not present

## 2020-03-07 DIAGNOSIS — Z170 Estrogen receptor positive status [ER+]: Secondary | ICD-10-CM | POA: Diagnosis not present

## 2020-03-07 DIAGNOSIS — R430 Anosmia: Secondary | ICD-10-CM | POA: Diagnosis not present

## 2020-03-07 DIAGNOSIS — J9600 Acute respiratory failure, unspecified whether with hypoxia or hypercapnia: Secondary | ICD-10-CM | POA: Diagnosis not present

## 2020-03-07 DIAGNOSIS — Z96611 Presence of right artificial shoulder joint: Secondary | ICD-10-CM | POA: Diagnosis not present

## 2020-03-07 DIAGNOSIS — S46011A Strain of muscle(s) and tendon(s) of the rotator cuff of right shoulder, initial encounter: Secondary | ICD-10-CM | POA: Diagnosis not present

## 2020-03-07 DIAGNOSIS — J309 Allergic rhinitis, unspecified: Secondary | ICD-10-CM | POA: Diagnosis not present

## 2020-03-07 DIAGNOSIS — R918 Other nonspecific abnormal finding of lung field: Secondary | ICD-10-CM | POA: Diagnosis not present

## 2020-03-07 DIAGNOSIS — J449 Chronic obstructive pulmonary disease, unspecified: Secondary | ICD-10-CM | POA: Diagnosis not present

## 2020-03-07 DIAGNOSIS — J45901 Unspecified asthma with (acute) exacerbation: Secondary | ICD-10-CM | POA: Diagnosis not present

## 2020-03-07 DIAGNOSIS — I63332 Cerebral infarction due to thrombosis of left posterior cerebral artery: Secondary | ICD-10-CM | POA: Diagnosis not present

## 2020-03-07 DIAGNOSIS — M9905 Segmental and somatic dysfunction of pelvic region: Secondary | ICD-10-CM | POA: Diagnosis not present

## 2020-03-07 DIAGNOSIS — D1800 Hemangioma unspecified site: Secondary | ICD-10-CM | POA: Diagnosis not present

## 2020-03-07 DIAGNOSIS — Z4802 Encounter for removal of sutures: Secondary | ICD-10-CM | POA: Diagnosis not present

## 2020-03-07 DIAGNOSIS — I472 Ventricular tachycardia: Secondary | ICD-10-CM | POA: Diagnosis not present

## 2020-03-07 DIAGNOSIS — B359 Dermatophytosis, unspecified: Secondary | ICD-10-CM | POA: Diagnosis not present

## 2020-03-07 DIAGNOSIS — R635 Abnormal weight gain: Secondary | ICD-10-CM | POA: Diagnosis not present

## 2020-03-07 DIAGNOSIS — M79601 Pain in right arm: Secondary | ICD-10-CM | POA: Diagnosis not present

## 2020-03-07 DIAGNOSIS — I998 Other disorder of circulatory system: Secondary | ICD-10-CM | POA: Diagnosis not present

## 2020-03-07 DIAGNOSIS — Z510 Encounter for antineoplastic radiation therapy: Secondary | ICD-10-CM | POA: Diagnosis not present

## 2020-03-07 DIAGNOSIS — Z792 Long term (current) use of antibiotics: Secondary | ICD-10-CM | POA: Diagnosis not present

## 2020-03-07 DIAGNOSIS — N950 Postmenopausal bleeding: Secondary | ICD-10-CM | POA: Diagnosis not present

## 2020-03-07 DIAGNOSIS — K649 Unspecified hemorrhoids: Secondary | ICD-10-CM | POA: Diagnosis not present

## 2020-03-07 DIAGNOSIS — H25811 Combined forms of age-related cataract, right eye: Secondary | ICD-10-CM | POA: Diagnosis not present

## 2020-03-07 DIAGNOSIS — G5791 Unspecified mononeuropathy of right lower limb: Secondary | ICD-10-CM | POA: Diagnosis not present

## 2020-03-07 DIAGNOSIS — E038 Other specified hypothyroidism: Secondary | ICD-10-CM | POA: Diagnosis not present

## 2020-03-07 DIAGNOSIS — C4321 Malignant melanoma of right ear and external auricular canal: Secondary | ICD-10-CM | POA: Diagnosis not present

## 2020-03-07 DIAGNOSIS — M21612 Bunion of left foot: Secondary | ICD-10-CM | POA: Diagnosis not present

## 2020-03-07 DIAGNOSIS — D239 Other benign neoplasm of skin, unspecified: Secondary | ICD-10-CM | POA: Diagnosis not present

## 2020-03-07 DIAGNOSIS — Z48812 Encounter for surgical aftercare following surgery on the circulatory system: Secondary | ICD-10-CM | POA: Diagnosis not present

## 2020-03-07 DIAGNOSIS — I429 Cardiomyopathy, unspecified: Secondary | ICD-10-CM | POA: Diagnosis not present

## 2020-03-07 DIAGNOSIS — E113299 Type 2 diabetes mellitus with mild nonproliferative diabetic retinopathy without macular edema, unspecified eye: Secondary | ICD-10-CM | POA: Diagnosis not present

## 2020-03-07 DIAGNOSIS — H43813 Vitreous degeneration, bilateral: Secondary | ICD-10-CM | POA: Diagnosis not present

## 2020-03-07 DIAGNOSIS — H7291 Unspecified perforation of tympanic membrane, right ear: Secondary | ICD-10-CM | POA: Diagnosis not present

## 2020-03-07 DIAGNOSIS — C44321 Squamous cell carcinoma of skin of nose: Secondary | ICD-10-CM | POA: Diagnosis not present

## 2020-03-07 DIAGNOSIS — D3A029 Benign carcinoid tumor of the large intestine, unspecified portion: Secondary | ICD-10-CM | POA: Diagnosis not present

## 2020-03-07 DIAGNOSIS — H401131 Primary open-angle glaucoma, bilateral, mild stage: Secondary | ICD-10-CM | POA: Diagnosis not present

## 2020-03-07 DIAGNOSIS — L97522 Non-pressure chronic ulcer of other part of left foot with fat layer exposed: Secondary | ICD-10-CM | POA: Diagnosis not present

## 2020-03-07 DIAGNOSIS — R809 Proteinuria, unspecified: Secondary | ICD-10-CM | POA: Diagnosis not present

## 2020-03-07 DIAGNOSIS — Z1212 Encounter for screening for malignant neoplasm of rectum: Secondary | ICD-10-CM | POA: Diagnosis not present

## 2020-03-07 DIAGNOSIS — G92 Toxic encephalopathy: Secondary | ICD-10-CM | POA: Diagnosis not present

## 2020-03-07 DIAGNOSIS — Z8249 Family history of ischemic heart disease and other diseases of the circulatory system: Secondary | ICD-10-CM | POA: Diagnosis not present

## 2020-03-07 DIAGNOSIS — M4306 Spondylolysis, lumbar region: Secondary | ICD-10-CM | POA: Diagnosis not present

## 2020-03-07 DIAGNOSIS — H43822 Vitreomacular adhesion, left eye: Secondary | ICD-10-CM | POA: Diagnosis not present

## 2020-03-07 DIAGNOSIS — B9689 Other specified bacterial agents as the cause of diseases classified elsewhere: Secondary | ICD-10-CM | POA: Diagnosis not present

## 2020-03-08 DIAGNOSIS — Z91018 Allergy to other foods: Secondary | ICD-10-CM | POA: Diagnosis not present

## 2020-03-08 DIAGNOSIS — K5909 Other constipation: Secondary | ICD-10-CM | POA: Diagnosis not present

## 2020-03-08 DIAGNOSIS — K4020 Bilateral inguinal hernia, without obstruction or gangrene, not specified as recurrent: Secondary | ICD-10-CM | POA: Diagnosis not present

## 2020-03-08 DIAGNOSIS — F3341 Major depressive disorder, recurrent, in partial remission: Secondary | ICD-10-CM | POA: Diagnosis not present

## 2020-03-08 DIAGNOSIS — I700 Atherosclerosis of aorta: Secondary | ICD-10-CM | POA: Diagnosis not present

## 2020-03-08 DIAGNOSIS — K921 Melena: Secondary | ICD-10-CM | POA: Diagnosis not present

## 2020-03-08 DIAGNOSIS — M62838 Other muscle spasm: Secondary | ICD-10-CM | POA: Diagnosis not present

## 2020-03-08 DIAGNOSIS — S92325D Nondisplaced fracture of second metatarsal bone, left foot, subsequent encounter for fracture with routine healing: Secondary | ICD-10-CM | POA: Diagnosis not present

## 2020-03-08 DIAGNOSIS — L812 Freckles: Secondary | ICD-10-CM | POA: Diagnosis not present

## 2020-03-08 DIAGNOSIS — H353132 Nonexudative age-related macular degeneration, bilateral, intermediate dry stage: Secondary | ICD-10-CM | POA: Diagnosis not present

## 2020-03-08 DIAGNOSIS — H47323 Drusen of optic disc, bilateral: Secondary | ICD-10-CM | POA: Diagnosis not present

## 2020-03-08 DIAGNOSIS — K5790 Diverticulosis of intestine, part unspecified, without perforation or abscess without bleeding: Secondary | ICD-10-CM | POA: Diagnosis not present

## 2020-03-08 DIAGNOSIS — B964 Proteus (mirabilis) (morganii) as the cause of diseases classified elsewhere: Secondary | ICD-10-CM | POA: Diagnosis not present

## 2020-03-08 DIAGNOSIS — S83232A Complex tear of medial meniscus, current injury, left knee, initial encounter: Secondary | ICD-10-CM | POA: Diagnosis not present

## 2020-03-08 DIAGNOSIS — K5130 Ulcerative (chronic) rectosigmoiditis without complications: Secondary | ICD-10-CM | POA: Diagnosis not present

## 2020-03-08 DIAGNOSIS — H47233 Glaucomatous optic atrophy, bilateral: Secondary | ICD-10-CM | POA: Diagnosis not present

## 2020-03-08 DIAGNOSIS — L97512 Non-pressure chronic ulcer of other part of right foot with fat layer exposed: Secondary | ICD-10-CM | POA: Diagnosis not present

## 2020-03-08 DIAGNOSIS — I672 Cerebral atherosclerosis: Secondary | ICD-10-CM | POA: Diagnosis not present

## 2020-03-08 DIAGNOSIS — Z96641 Presence of right artificial hip joint: Secondary | ICD-10-CM | POA: Diagnosis not present

## 2020-03-08 DIAGNOSIS — M75111 Incomplete rotator cuff tear or rupture of right shoulder, not specified as traumatic: Secondary | ICD-10-CM | POA: Diagnosis not present

## 2020-03-08 DIAGNOSIS — C50311 Malignant neoplasm of lower-inner quadrant of right female breast: Secondary | ICD-10-CM | POA: Diagnosis not present

## 2020-03-08 DIAGNOSIS — H40023 Open angle with borderline findings, high risk, bilateral: Secondary | ICD-10-CM | POA: Diagnosis not present

## 2020-03-08 DIAGNOSIS — I110 Hypertensive heart disease with heart failure: Secondary | ICD-10-CM | POA: Diagnosis not present

## 2020-03-08 DIAGNOSIS — F338 Other recurrent depressive disorders: Secondary | ICD-10-CM | POA: Diagnosis not present

## 2020-03-08 DIAGNOSIS — F332 Major depressive disorder, recurrent severe without psychotic features: Secondary | ICD-10-CM | POA: Diagnosis not present

## 2020-03-08 DIAGNOSIS — M272 Inflammatory conditions of jaws: Secondary | ICD-10-CM | POA: Diagnosis not present

## 2020-03-08 DIAGNOSIS — M25652 Stiffness of left hip, not elsewhere classified: Secondary | ICD-10-CM | POA: Diagnosis not present

## 2020-03-08 DIAGNOSIS — Z923 Personal history of irradiation: Secondary | ICD-10-CM | POA: Diagnosis not present

## 2020-03-08 DIAGNOSIS — Z03818 Encounter for observation for suspected exposure to other biological agents ruled out: Secondary | ICD-10-CM | POA: Diagnosis not present

## 2020-03-08 DIAGNOSIS — H04129 Dry eye syndrome of unspecified lacrimal gland: Secondary | ICD-10-CM | POA: Diagnosis not present

## 2020-03-08 DIAGNOSIS — H353221 Exudative age-related macular degeneration, left eye, with active choroidal neovascularization: Secondary | ICD-10-CM | POA: Diagnosis not present

## 2020-03-08 DIAGNOSIS — I999 Unspecified disorder of circulatory system: Secondary | ICD-10-CM | POA: Diagnosis not present

## 2020-03-08 DIAGNOSIS — M62830 Muscle spasm of back: Secondary | ICD-10-CM | POA: Diagnosis not present

## 2020-03-08 DIAGNOSIS — E1142 Type 2 diabetes mellitus with diabetic polyneuropathy: Secondary | ICD-10-CM | POA: Diagnosis not present

## 2020-03-08 DIAGNOSIS — Z9484 Stem cells transplant status: Secondary | ICD-10-CM | POA: Diagnosis not present

## 2020-03-08 DIAGNOSIS — T7800XD Anaphylactic reaction due to unspecified food, subsequent encounter: Secondary | ICD-10-CM | POA: Diagnosis not present

## 2020-03-08 DIAGNOSIS — C4442 Squamous cell carcinoma of skin of scalp and neck: Secondary | ICD-10-CM | POA: Diagnosis not present

## 2020-03-08 DIAGNOSIS — R682 Dry mouth, unspecified: Secondary | ICD-10-CM | POA: Diagnosis not present

## 2020-03-08 DIAGNOSIS — R5381 Other malaise: Secondary | ICD-10-CM | POA: Diagnosis not present

## 2020-03-08 DIAGNOSIS — M5489 Other dorsalgia: Secondary | ICD-10-CM | POA: Diagnosis not present

## 2020-03-08 DIAGNOSIS — H353131 Nonexudative age-related macular degeneration, bilateral, early dry stage: Secondary | ICD-10-CM | POA: Diagnosis not present

## 2020-03-08 DIAGNOSIS — G478 Other sleep disorders: Secondary | ICD-10-CM | POA: Diagnosis not present

## 2020-03-08 DIAGNOSIS — R7309 Other abnormal glucose: Secondary | ICD-10-CM | POA: Diagnosis not present

## 2020-03-08 DIAGNOSIS — M4306 Spondylolysis, lumbar region: Secondary | ICD-10-CM | POA: Diagnosis not present

## 2020-03-08 DIAGNOSIS — F329 Major depressive disorder, single episode, unspecified: Secondary | ICD-10-CM | POA: Diagnosis not present

## 2020-03-08 DIAGNOSIS — S32010A Wedge compression fracture of first lumbar vertebra, initial encounter for closed fracture: Secondary | ICD-10-CM | POA: Diagnosis not present

## 2020-03-08 DIAGNOSIS — M25579 Pain in unspecified ankle and joints of unspecified foot: Secondary | ICD-10-CM | POA: Diagnosis not present

## 2020-03-08 DIAGNOSIS — H35371 Puckering of macula, right eye: Secondary | ICD-10-CM | POA: Diagnosis not present

## 2020-03-08 DIAGNOSIS — E875 Hyperkalemia: Secondary | ICD-10-CM | POA: Diagnosis not present

## 2020-03-08 DIAGNOSIS — C642 Malignant neoplasm of left kidney, except renal pelvis: Secondary | ICD-10-CM | POA: Diagnosis not present

## 2020-03-08 DIAGNOSIS — H16103 Unspecified superficial keratitis, bilateral: Secondary | ICD-10-CM | POA: Diagnosis not present

## 2020-03-08 DIAGNOSIS — E7800 Pure hypercholesterolemia, unspecified: Secondary | ICD-10-CM | POA: Diagnosis not present

## 2020-03-08 DIAGNOSIS — H527 Unspecified disorder of refraction: Secondary | ICD-10-CM | POA: Diagnosis not present

## 2020-03-08 DIAGNOSIS — M79675 Pain in left toe(s): Secondary | ICD-10-CM | POA: Diagnosis not present

## 2020-03-08 DIAGNOSIS — E8589 Other amyloidosis: Secondary | ICD-10-CM | POA: Diagnosis not present

## 2020-03-08 DIAGNOSIS — R7982 Elevated C-reactive protein (CRP): Secondary | ICD-10-CM | POA: Diagnosis not present

## 2020-03-08 DIAGNOSIS — E113411 Type 2 diabetes mellitus with severe nonproliferative diabetic retinopathy with macular edema, right eye: Secondary | ICD-10-CM | POA: Diagnosis not present

## 2020-03-08 DIAGNOSIS — Z4682 Encounter for fitting and adjustment of non-vascular catheter: Secondary | ICD-10-CM | POA: Diagnosis not present

## 2020-03-08 DIAGNOSIS — M4312 Spondylolisthesis, cervical region: Secondary | ICD-10-CM | POA: Diagnosis not present

## 2020-03-08 DIAGNOSIS — Q6689 Other  specified congenital deformities of feet: Secondary | ICD-10-CM | POA: Diagnosis not present

## 2020-03-08 DIAGNOSIS — Z85038 Personal history of other malignant neoplasm of large intestine: Secondary | ICD-10-CM | POA: Diagnosis not present

## 2020-03-08 DIAGNOSIS — R159 Full incontinence of feces: Secondary | ICD-10-CM | POA: Diagnosis not present

## 2020-03-08 DIAGNOSIS — Z5321 Procedure and treatment not carried out due to patient leaving prior to being seen by health care provider: Secondary | ICD-10-CM | POA: Diagnosis not present

## 2020-03-08 DIAGNOSIS — R933 Abnormal findings on diagnostic imaging of other parts of digestive tract: Secondary | ICD-10-CM | POA: Diagnosis not present

## 2020-03-08 DIAGNOSIS — F17220 Nicotine dependence, chewing tobacco, uncomplicated: Secondary | ICD-10-CM | POA: Diagnosis not present

## 2020-03-08 DIAGNOSIS — B351 Tinea unguium: Secondary | ICD-10-CM | POA: Diagnosis not present

## 2020-03-08 DIAGNOSIS — L570 Actinic keratosis: Secondary | ICD-10-CM | POA: Diagnosis not present

## 2020-03-08 DIAGNOSIS — Z8249 Family history of ischemic heart disease and other diseases of the circulatory system: Secondary | ICD-10-CM | POA: Diagnosis not present

## 2020-03-08 DIAGNOSIS — R5383 Other fatigue: Secondary | ICD-10-CM | POA: Diagnosis not present

## 2020-03-08 DIAGNOSIS — R0781 Pleurodynia: Secondary | ICD-10-CM | POA: Diagnosis not present

## 2020-03-08 DIAGNOSIS — J441 Chronic obstructive pulmonary disease with (acute) exacerbation: Secondary | ICD-10-CM | POA: Diagnosis not present

## 2020-03-08 DIAGNOSIS — D0511 Intraductal carcinoma in situ of right breast: Secondary | ICD-10-CM | POA: Diagnosis not present

## 2020-03-08 DIAGNOSIS — Z6827 Body mass index (BMI) 27.0-27.9, adult: Secondary | ICD-10-CM | POA: Diagnosis not present

## 2020-03-08 DIAGNOSIS — I429 Cardiomyopathy, unspecified: Secondary | ICD-10-CM | POA: Diagnosis not present

## 2020-03-08 DIAGNOSIS — L503 Dermatographic urticaria: Secondary | ICD-10-CM | POA: Diagnosis not present

## 2020-03-08 DIAGNOSIS — R4020 Unspecified coma: Secondary | ICD-10-CM | POA: Diagnosis not present

## 2020-03-08 DIAGNOSIS — G35 Multiple sclerosis: Secondary | ICD-10-CM | POA: Diagnosis not present

## 2020-03-08 DIAGNOSIS — L408 Other psoriasis: Secondary | ICD-10-CM | POA: Diagnosis not present

## 2020-03-08 DIAGNOSIS — Z122 Encounter for screening for malignant neoplasm of respiratory organs: Secondary | ICD-10-CM | POA: Diagnosis not present

## 2020-03-08 DIAGNOSIS — E785 Hyperlipidemia, unspecified: Secondary | ICD-10-CM | POA: Diagnosis not present

## 2020-03-08 DIAGNOSIS — R922 Inconclusive mammogram: Secondary | ICD-10-CM | POA: Diagnosis not present

## 2020-03-08 DIAGNOSIS — H43393 Other vitreous opacities, bilateral: Secondary | ICD-10-CM | POA: Diagnosis not present

## 2020-03-08 DIAGNOSIS — K862 Cyst of pancreas: Secondary | ICD-10-CM | POA: Diagnosis not present

## 2020-03-08 DIAGNOSIS — M2560 Stiffness of unspecified joint, not elsewhere classified: Secondary | ICD-10-CM | POA: Diagnosis not present

## 2020-03-08 DIAGNOSIS — K589 Irritable bowel syndrome without diarrhea: Secondary | ICD-10-CM | POA: Diagnosis not present

## 2020-03-08 DIAGNOSIS — M533 Sacrococcygeal disorders, not elsewhere classified: Secondary | ICD-10-CM | POA: Diagnosis not present

## 2020-03-08 DIAGNOSIS — C9000 Multiple myeloma not having achieved remission: Secondary | ICD-10-CM | POA: Diagnosis not present

## 2020-03-08 DIAGNOSIS — I214 Non-ST elevation (NSTEMI) myocardial infarction: Secondary | ICD-10-CM | POA: Diagnosis not present

## 2020-03-08 DIAGNOSIS — L739 Follicular disorder, unspecified: Secondary | ICD-10-CM | POA: Diagnosis not present

## 2020-03-08 DIAGNOSIS — H35341 Macular cyst, hole, or pseudohole, right eye: Secondary | ICD-10-CM | POA: Diagnosis not present

## 2020-03-08 DIAGNOSIS — R3912 Poor urinary stream: Secondary | ICD-10-CM | POA: Diagnosis not present

## 2020-03-08 DIAGNOSIS — K4091 Unilateral inguinal hernia, without obstruction or gangrene, recurrent: Secondary | ICD-10-CM | POA: Diagnosis not present

## 2020-03-08 DIAGNOSIS — C649 Malignant neoplasm of unspecified kidney, except renal pelvis: Secondary | ICD-10-CM | POA: Diagnosis not present

## 2020-03-08 DIAGNOSIS — M79661 Pain in right lower leg: Secondary | ICD-10-CM | POA: Diagnosis not present

## 2020-03-08 DIAGNOSIS — D539 Nutritional anemia, unspecified: Secondary | ICD-10-CM | POA: Diagnosis not present

## 2020-03-08 DIAGNOSIS — N2889 Other specified disorders of kidney and ureter: Secondary | ICD-10-CM | POA: Diagnosis not present

## 2020-03-08 DIAGNOSIS — Z792 Long term (current) use of antibiotics: Secondary | ICD-10-CM | POA: Diagnosis not present

## 2020-03-08 DIAGNOSIS — M25611 Stiffness of right shoulder, not elsewhere classified: Secondary | ICD-10-CM | POA: Diagnosis not present

## 2020-03-08 DIAGNOSIS — R9389 Abnormal findings on diagnostic imaging of other specified body structures: Secondary | ICD-10-CM | POA: Diagnosis not present

## 2020-03-08 DIAGNOSIS — M961 Postlaminectomy syndrome, not elsewhere classified: Secondary | ICD-10-CM | POA: Diagnosis not present

## 2020-03-08 DIAGNOSIS — R419 Unspecified symptoms and signs involving cognitive functions and awareness: Secondary | ICD-10-CM | POA: Diagnosis not present

## 2020-03-08 DIAGNOSIS — S82241B Displaced spiral fracture of shaft of right tibia, initial encounter for open fracture type I or II: Secondary | ICD-10-CM | POA: Diagnosis not present

## 2020-03-08 DIAGNOSIS — K5100 Ulcerative (chronic) pancolitis without complications: Secondary | ICD-10-CM | POA: Diagnosis not present

## 2020-03-08 DIAGNOSIS — W050XXA Fall from non-moving wheelchair, initial encounter: Secondary | ICD-10-CM | POA: Diagnosis not present

## 2020-03-08 DIAGNOSIS — I83029 Varicose veins of left lower extremity with ulcer of unspecified site: Secondary | ICD-10-CM | POA: Diagnosis not present

## 2020-03-08 DIAGNOSIS — M4696 Unspecified inflammatory spondylopathy, lumbar region: Secondary | ICD-10-CM | POA: Diagnosis not present

## 2020-03-08 DIAGNOSIS — C320 Malignant neoplasm of glottis: Secondary | ICD-10-CM | POA: Diagnosis not present

## 2020-03-08 DIAGNOSIS — E559 Vitamin D deficiency, unspecified: Secondary | ICD-10-CM | POA: Diagnosis not present

## 2020-03-08 DIAGNOSIS — I25118 Atherosclerotic heart disease of native coronary artery with other forms of angina pectoris: Secondary | ICD-10-CM | POA: Diagnosis not present

## 2020-03-08 DIAGNOSIS — F609 Personality disorder, unspecified: Secondary | ICD-10-CM | POA: Diagnosis not present

## 2020-03-08 DIAGNOSIS — F488 Other specified nonpsychotic mental disorders: Secondary | ICD-10-CM | POA: Diagnosis not present

## 2020-03-08 DIAGNOSIS — J449 Chronic obstructive pulmonary disease, unspecified: Secondary | ICD-10-CM | POA: Diagnosis not present

## 2020-03-08 DIAGNOSIS — I659 Occlusion and stenosis of unspecified precerebral artery: Secondary | ICD-10-CM | POA: Diagnosis not present

## 2020-03-08 DIAGNOSIS — J9611 Chronic respiratory failure with hypoxia: Secondary | ICD-10-CM | POA: Diagnosis not present

## 2020-03-08 DIAGNOSIS — A045 Campylobacter enteritis: Secondary | ICD-10-CM | POA: Diagnosis not present

## 2020-03-08 DIAGNOSIS — M25532 Pain in left wrist: Secondary | ICD-10-CM | POA: Diagnosis not present

## 2020-03-08 DIAGNOSIS — G459 Transient cerebral ischemic attack, unspecified: Secondary | ICD-10-CM | POA: Diagnosis not present

## 2020-03-08 DIAGNOSIS — C184 Malignant neoplasm of transverse colon: Secondary | ICD-10-CM | POA: Diagnosis not present

## 2020-03-08 DIAGNOSIS — G5602 Carpal tunnel syndrome, left upper limb: Secondary | ICD-10-CM | POA: Diagnosis not present

## 2020-03-08 DIAGNOSIS — D510 Vitamin B12 deficiency anemia due to intrinsic factor deficiency: Secondary | ICD-10-CM | POA: Diagnosis not present

## 2020-03-08 DIAGNOSIS — Z993 Dependence on wheelchair: Secondary | ICD-10-CM | POA: Diagnosis not present

## 2020-03-08 DIAGNOSIS — J479 Bronchiectasis, uncomplicated: Secondary | ICD-10-CM | POA: Diagnosis not present

## 2020-03-08 DIAGNOSIS — I5042 Chronic combined systolic (congestive) and diastolic (congestive) heart failure: Secondary | ICD-10-CM | POA: Diagnosis not present

## 2020-03-08 DIAGNOSIS — H34831 Tributary (branch) retinal vein occlusion, right eye, with macular edema: Secondary | ICD-10-CM | POA: Diagnosis not present

## 2020-03-08 DIAGNOSIS — R42 Dizziness and giddiness: Secondary | ICD-10-CM | POA: Diagnosis not present

## 2020-03-08 DIAGNOSIS — M4316 Spondylolisthesis, lumbar region: Secondary | ICD-10-CM | POA: Diagnosis not present

## 2020-03-08 DIAGNOSIS — I350 Nonrheumatic aortic (valve) stenosis: Secondary | ICD-10-CM | POA: Diagnosis not present

## 2020-03-08 DIAGNOSIS — M75122 Complete rotator cuff tear or rupture of left shoulder, not specified as traumatic: Secondary | ICD-10-CM | POA: Diagnosis not present

## 2020-03-08 DIAGNOSIS — K922 Gastrointestinal hemorrhage, unspecified: Secondary | ICD-10-CM | POA: Diagnosis not present

## 2020-03-08 DIAGNOSIS — B948 Sequelae of other specified infectious and parasitic diseases: Secondary | ICD-10-CM | POA: Diagnosis not present

## 2020-03-08 DIAGNOSIS — H353111 Nonexudative age-related macular degeneration, right eye, early dry stage: Secondary | ICD-10-CM | POA: Diagnosis not present

## 2020-03-08 DIAGNOSIS — D509 Iron deficiency anemia, unspecified: Secondary | ICD-10-CM | POA: Diagnosis not present

## 2020-03-08 DIAGNOSIS — R040 Epistaxis: Secondary | ICD-10-CM | POA: Diagnosis not present

## 2020-03-08 DIAGNOSIS — T82855A Stenosis of coronary artery stent, initial encounter: Secondary | ICD-10-CM | POA: Diagnosis not present

## 2020-03-08 DIAGNOSIS — M75112 Incomplete rotator cuff tear or rupture of left shoulder, not specified as traumatic: Secondary | ICD-10-CM | POA: Diagnosis not present

## 2020-03-08 DIAGNOSIS — Z1339 Encounter for screening examination for other mental health and behavioral disorders: Secondary | ICD-10-CM | POA: Diagnosis not present

## 2020-03-08 DIAGNOSIS — N138 Other obstructive and reflux uropathy: Secondary | ICD-10-CM | POA: Diagnosis not present

## 2020-03-08 DIAGNOSIS — G301 Alzheimer's disease with late onset: Secondary | ICD-10-CM | POA: Diagnosis not present

## 2020-03-08 DIAGNOSIS — F322 Major depressive disorder, single episode, severe without psychotic features: Secondary | ICD-10-CM | POA: Diagnosis not present

## 2020-03-08 DIAGNOSIS — H35443 Age-related reticular degeneration of retina, bilateral: Secondary | ICD-10-CM | POA: Diagnosis not present

## 2020-03-08 DIAGNOSIS — T82120D Displacement of cardiac electrode, subsequent encounter: Secondary | ICD-10-CM | POA: Diagnosis not present

## 2020-03-08 DIAGNOSIS — H35033 Hypertensive retinopathy, bilateral: Secondary | ICD-10-CM | POA: Diagnosis not present

## 2020-03-08 DIAGNOSIS — J3489 Other specified disorders of nose and nasal sinuses: Secondary | ICD-10-CM | POA: Diagnosis not present

## 2020-03-08 DIAGNOSIS — Z45010 Encounter for checking and testing of cardiac pacemaker pulse generator [battery]: Secondary | ICD-10-CM | POA: Diagnosis not present

## 2020-03-08 DIAGNOSIS — R58 Hemorrhage, not elsewhere classified: Secondary | ICD-10-CM | POA: Diagnosis not present

## 2020-03-08 DIAGNOSIS — R404 Transient alteration of awareness: Secondary | ICD-10-CM | POA: Diagnosis not present

## 2020-03-08 DIAGNOSIS — G5601 Carpal tunnel syndrome, right upper limb: Secondary | ICD-10-CM | POA: Diagnosis not present

## 2020-03-08 DIAGNOSIS — R946 Abnormal results of thyroid function studies: Secondary | ICD-10-CM | POA: Diagnosis not present

## 2020-03-08 DIAGNOSIS — R0902 Hypoxemia: Secondary | ICD-10-CM | POA: Diagnosis not present

## 2020-03-08 DIAGNOSIS — M7041 Prepatellar bursitis, right knee: Secondary | ICD-10-CM | POA: Diagnosis not present

## 2020-03-08 DIAGNOSIS — Z8701 Personal history of pneumonia (recurrent): Secondary | ICD-10-CM | POA: Diagnosis not present

## 2020-03-08 DIAGNOSIS — Z96651 Presence of right artificial knee joint: Secondary | ICD-10-CM | POA: Diagnosis not present

## 2020-03-08 DIAGNOSIS — Z79890 Hormone replacement therapy (postmenopausal): Secondary | ICD-10-CM | POA: Diagnosis not present

## 2020-03-08 DIAGNOSIS — H1031 Unspecified acute conjunctivitis, right eye: Secondary | ICD-10-CM | POA: Diagnosis not present

## 2020-03-08 DIAGNOSIS — J9600 Acute respiratory failure, unspecified whether with hypoxia or hypercapnia: Secondary | ICD-10-CM | POA: Diagnosis not present

## 2020-03-08 DIAGNOSIS — Q031 Atresia of foramina of Magendie and Luschka: Secondary | ICD-10-CM | POA: Diagnosis not present

## 2020-03-08 DIAGNOSIS — J3081 Allergic rhinitis due to animal (cat) (dog) hair and dander: Secondary | ICD-10-CM | POA: Diagnosis not present

## 2020-03-08 DIAGNOSIS — M79605 Pain in left leg: Secondary | ICD-10-CM | POA: Diagnosis not present

## 2020-03-08 DIAGNOSIS — L97522 Non-pressure chronic ulcer of other part of left foot with fat layer exposed: Secondary | ICD-10-CM | POA: Diagnosis not present

## 2020-03-08 DIAGNOSIS — L03116 Cellulitis of left lower limb: Secondary | ICD-10-CM | POA: Diagnosis not present

## 2020-03-08 DIAGNOSIS — N1832 Chronic kidney disease, stage 3b: Secondary | ICD-10-CM | POA: Diagnosis not present

## 2020-03-08 DIAGNOSIS — T50995D Adverse effect of other drugs, medicaments and biological substances, subsequent encounter: Secondary | ICD-10-CM | POA: Diagnosis not present

## 2020-03-08 DIAGNOSIS — R54 Age-related physical debility: Secondary | ICD-10-CM | POA: Diagnosis not present

## 2020-03-08 DIAGNOSIS — G620 Drug-induced polyneuropathy: Secondary | ICD-10-CM | POA: Diagnosis not present

## 2020-03-08 DIAGNOSIS — J9621 Acute and chronic respiratory failure with hypoxia: Secondary | ICD-10-CM | POA: Diagnosis not present

## 2020-03-08 DIAGNOSIS — K5904 Chronic idiopathic constipation: Secondary | ICD-10-CM | POA: Diagnosis not present

## 2020-03-08 DIAGNOSIS — N3001 Acute cystitis with hematuria: Secondary | ICD-10-CM | POA: Diagnosis not present

## 2020-03-08 DIAGNOSIS — H903 Sensorineural hearing loss, bilateral: Secondary | ICD-10-CM | POA: Diagnosis not present

## 2020-03-08 DIAGNOSIS — M222X9 Patellofemoral disorders, unspecified knee: Secondary | ICD-10-CM | POA: Diagnosis not present

## 2020-03-08 DIAGNOSIS — J410 Simple chronic bronchitis: Secondary | ICD-10-CM | POA: Diagnosis not present

## 2020-03-08 DIAGNOSIS — Z96642 Presence of left artificial hip joint: Secondary | ICD-10-CM | POA: Diagnosis not present

## 2020-03-08 DIAGNOSIS — C8318 Mantle cell lymphoma, lymph nodes of multiple sites: Secondary | ICD-10-CM | POA: Diagnosis not present

## 2020-03-08 DIAGNOSIS — N289 Disorder of kidney and ureter, unspecified: Secondary | ICD-10-CM | POA: Diagnosis not present

## 2020-03-08 DIAGNOSIS — C155 Malignant neoplasm of lower third of esophagus: Secondary | ICD-10-CM | POA: Diagnosis not present

## 2020-03-08 DIAGNOSIS — M75101 Unspecified rotator cuff tear or rupture of right shoulder, not specified as traumatic: Secondary | ICD-10-CM | POA: Diagnosis not present

## 2020-03-08 DIAGNOSIS — H0288B Meibomian gland dysfunction left eye, upper and lower eyelids: Secondary | ICD-10-CM | POA: Diagnosis not present

## 2020-03-08 DIAGNOSIS — J9383 Other pneumothorax: Secondary | ICD-10-CM | POA: Diagnosis not present

## 2020-03-08 DIAGNOSIS — M3501 Sicca syndrome with keratoconjunctivitis: Secondary | ICD-10-CM | POA: Diagnosis not present

## 2020-03-08 DIAGNOSIS — L820 Inflamed seborrheic keratosis: Secondary | ICD-10-CM | POA: Diagnosis not present

## 2020-03-08 DIAGNOSIS — M7918 Myalgia, other site: Secondary | ICD-10-CM | POA: Diagnosis not present

## 2020-03-08 DIAGNOSIS — R05 Cough: Secondary | ICD-10-CM | POA: Diagnosis not present

## 2020-03-08 DIAGNOSIS — I872 Venous insufficiency (chronic) (peripheral): Secondary | ICD-10-CM | POA: Diagnosis not present

## 2020-03-08 DIAGNOSIS — M810 Age-related osteoporosis without current pathological fracture: Secondary | ICD-10-CM | POA: Diagnosis not present

## 2020-03-08 DIAGNOSIS — S81801D Unspecified open wound, right lower leg, subsequent encounter: Secondary | ICD-10-CM | POA: Diagnosis not present

## 2020-03-08 DIAGNOSIS — M25572 Pain in left ankle and joints of left foot: Secondary | ICD-10-CM | POA: Diagnosis not present

## 2020-03-08 DIAGNOSIS — K7400 Hepatic fibrosis, unspecified: Secondary | ICD-10-CM | POA: Diagnosis not present

## 2020-03-08 DIAGNOSIS — I340 Nonrheumatic mitral (valve) insufficiency: Secondary | ICD-10-CM | POA: Diagnosis not present

## 2020-03-08 DIAGNOSIS — Z7984 Long term (current) use of oral hypoglycemic drugs: Secondary | ICD-10-CM | POA: Diagnosis not present

## 2020-03-08 DIAGNOSIS — H02403 Unspecified ptosis of bilateral eyelids: Secondary | ICD-10-CM | POA: Diagnosis not present

## 2020-03-08 DIAGNOSIS — S46011D Strain of muscle(s) and tendon(s) of the rotator cuff of right shoulder, subsequent encounter: Secondary | ICD-10-CM | POA: Diagnosis not present

## 2020-03-08 DIAGNOSIS — H5213 Myopia, bilateral: Secondary | ICD-10-CM | POA: Diagnosis not present

## 2020-03-08 DIAGNOSIS — I5032 Chronic diastolic (congestive) heart failure: Secondary | ICD-10-CM | POA: Diagnosis not present

## 2020-03-08 DIAGNOSIS — M1288 Other specific arthropathies, not elsewhere classified, other specified site: Secondary | ICD-10-CM | POA: Diagnosis not present

## 2020-03-08 DIAGNOSIS — M1612 Unilateral primary osteoarthritis, left hip: Secondary | ICD-10-CM | POA: Diagnosis not present

## 2020-03-08 DIAGNOSIS — I099 Rheumatic heart disease, unspecified: Secondary | ICD-10-CM | POA: Diagnosis not present

## 2020-03-08 DIAGNOSIS — F1021 Alcohol dependence, in remission: Secondary | ICD-10-CM | POA: Diagnosis not present

## 2020-03-08 DIAGNOSIS — D485 Neoplasm of uncertain behavior of skin: Secondary | ICD-10-CM | POA: Diagnosis not present

## 2020-03-08 DIAGNOSIS — H01002 Unspecified blepharitis right lower eyelid: Secondary | ICD-10-CM | POA: Diagnosis not present

## 2020-03-08 DIAGNOSIS — J45909 Unspecified asthma, uncomplicated: Secondary | ICD-10-CM | POA: Diagnosis not present

## 2020-03-08 DIAGNOSIS — G500 Trigeminal neuralgia: Secondary | ICD-10-CM | POA: Diagnosis not present

## 2020-03-08 DIAGNOSIS — F419 Anxiety disorder, unspecified: Secondary | ICD-10-CM | POA: Diagnosis not present

## 2020-03-08 DIAGNOSIS — Z681 Body mass index (BMI) 19 or less, adult: Secondary | ICD-10-CM | POA: Diagnosis not present

## 2020-03-08 DIAGNOSIS — M79676 Pain in unspecified toe(s): Secondary | ICD-10-CM | POA: Diagnosis not present

## 2020-03-08 DIAGNOSIS — C50212 Malignant neoplasm of upper-inner quadrant of left female breast: Secondary | ICD-10-CM | POA: Diagnosis not present

## 2020-03-08 DIAGNOSIS — H0102A Squamous blepharitis right eye, upper and lower eyelids: Secondary | ICD-10-CM | POA: Diagnosis not present

## 2020-03-08 DIAGNOSIS — M85832 Other specified disorders of bone density and structure, left forearm: Secondary | ICD-10-CM | POA: Diagnosis not present

## 2020-03-08 DIAGNOSIS — B957 Other staphylococcus as the cause of diseases classified elsewhere: Secondary | ICD-10-CM | POA: Diagnosis not present

## 2020-03-08 DIAGNOSIS — M79645 Pain in left finger(s): Secondary | ICD-10-CM | POA: Diagnosis not present

## 2020-03-08 DIAGNOSIS — N3946 Mixed incontinence: Secondary | ICD-10-CM | POA: Diagnosis not present

## 2020-03-08 DIAGNOSIS — R1032 Left lower quadrant pain: Secondary | ICD-10-CM | POA: Diagnosis not present

## 2020-03-08 DIAGNOSIS — I421 Obstructive hypertrophic cardiomyopathy: Secondary | ICD-10-CM | POA: Diagnosis not present

## 2020-03-08 DIAGNOSIS — M1990 Unspecified osteoarthritis, unspecified site: Secondary | ICD-10-CM | POA: Diagnosis not present

## 2020-03-08 DIAGNOSIS — K7689 Other specified diseases of liver: Secondary | ICD-10-CM | POA: Diagnosis not present

## 2020-03-08 DIAGNOSIS — Z636 Dependent relative needing care at home: Secondary | ICD-10-CM | POA: Diagnosis not present

## 2020-03-08 DIAGNOSIS — D45 Polycythemia vera: Secondary | ICD-10-CM | POA: Diagnosis not present

## 2020-03-08 DIAGNOSIS — Z6841 Body Mass Index (BMI) 40.0 and over, adult: Secondary | ICD-10-CM | POA: Diagnosis not present

## 2020-03-08 DIAGNOSIS — R911 Solitary pulmonary nodule: Secondary | ICD-10-CM | POA: Diagnosis not present

## 2020-03-08 DIAGNOSIS — M79673 Pain in unspecified foot: Secondary | ICD-10-CM | POA: Diagnosis not present

## 2020-03-08 DIAGNOSIS — F900 Attention-deficit hyperactivity disorder, predominantly inattentive type: Secondary | ICD-10-CM | POA: Diagnosis not present

## 2020-03-08 DIAGNOSIS — K660 Peritoneal adhesions (postprocedural) (postinfection): Secondary | ICD-10-CM | POA: Diagnosis not present

## 2020-03-08 DIAGNOSIS — Z9011 Acquired absence of right breast and nipple: Secondary | ICD-10-CM | POA: Diagnosis not present

## 2020-03-08 DIAGNOSIS — K22710 Barrett's esophagus with low grade dysplasia: Secondary | ICD-10-CM | POA: Diagnosis not present

## 2020-03-08 DIAGNOSIS — L089 Local infection of the skin and subcutaneous tissue, unspecified: Secondary | ICD-10-CM | POA: Diagnosis not present

## 2020-03-08 DIAGNOSIS — S22000A Wedge compression fracture of unspecified thoracic vertebra, initial encounter for closed fracture: Secondary | ICD-10-CM | POA: Diagnosis not present

## 2020-03-08 DIAGNOSIS — L602 Onychogryphosis: Secondary | ICD-10-CM | POA: Diagnosis not present

## 2020-03-08 DIAGNOSIS — L97319 Non-pressure chronic ulcer of right ankle with unspecified severity: Secondary | ICD-10-CM | POA: Diagnosis not present

## 2020-03-08 DIAGNOSIS — I469 Cardiac arrest, cause unspecified: Secondary | ICD-10-CM | POA: Diagnosis not present

## 2020-03-08 DIAGNOSIS — Z7982 Long term (current) use of aspirin: Secondary | ICD-10-CM | POA: Diagnosis not present

## 2020-03-08 DIAGNOSIS — I499 Cardiac arrhythmia, unspecified: Secondary | ICD-10-CM | POA: Diagnosis not present

## 2020-03-08 DIAGNOSIS — R590 Localized enlarged lymph nodes: Secondary | ICD-10-CM | POA: Diagnosis not present

## 2020-03-08 DIAGNOSIS — Z9049 Acquired absence of other specified parts of digestive tract: Secondary | ICD-10-CM | POA: Diagnosis not present

## 2020-03-08 DIAGNOSIS — I82409 Acute embolism and thrombosis of unspecified deep veins of unspecified lower extremity: Secondary | ICD-10-CM | POA: Diagnosis not present

## 2020-03-08 DIAGNOSIS — S42291D Other displaced fracture of upper end of right humerus, subsequent encounter for fracture with routine healing: Secondary | ICD-10-CM | POA: Diagnosis not present

## 2020-03-08 DIAGNOSIS — S42214A Unspecified nondisplaced fracture of surgical neck of right humerus, initial encounter for closed fracture: Secondary | ICD-10-CM | POA: Diagnosis not present

## 2020-03-08 DIAGNOSIS — E1122 Type 2 diabetes mellitus with diabetic chronic kidney disease: Secondary | ICD-10-CM | POA: Diagnosis not present

## 2020-03-08 DIAGNOSIS — H43391 Other vitreous opacities, right eye: Secondary | ICD-10-CM | POA: Diagnosis not present

## 2020-03-08 DIAGNOSIS — L905 Scar conditions and fibrosis of skin: Secondary | ICD-10-CM | POA: Diagnosis not present

## 2020-03-08 DIAGNOSIS — H348111 Central retinal vein occlusion, right eye, with retinal neovascularization: Secondary | ICD-10-CM | POA: Diagnosis not present

## 2020-03-08 DIAGNOSIS — E611 Iron deficiency: Secondary | ICD-10-CM | POA: Diagnosis not present

## 2020-03-08 DIAGNOSIS — K649 Unspecified hemorrhoids: Secondary | ICD-10-CM | POA: Diagnosis not present

## 2020-03-08 DIAGNOSIS — F1120 Opioid dependence, uncomplicated: Secondary | ICD-10-CM | POA: Diagnosis not present

## 2020-03-08 DIAGNOSIS — Z7901 Long term (current) use of anticoagulants: Secondary | ICD-10-CM | POA: Diagnosis not present

## 2020-03-08 DIAGNOSIS — J329 Chronic sinusitis, unspecified: Secondary | ICD-10-CM | POA: Diagnosis not present

## 2020-03-08 DIAGNOSIS — I50812 Chronic right heart failure: Secondary | ICD-10-CM | POA: Diagnosis not present

## 2020-03-08 DIAGNOSIS — Z951 Presence of aortocoronary bypass graft: Secondary | ICD-10-CM | POA: Diagnosis not present

## 2020-03-08 DIAGNOSIS — E1169 Type 2 diabetes mellitus with other specified complication: Secondary | ICD-10-CM | POA: Diagnosis not present

## 2020-03-08 DIAGNOSIS — Z86010 Personal history of colonic polyps: Secondary | ICD-10-CM | POA: Diagnosis not present

## 2020-03-08 DIAGNOSIS — C9110 Chronic lymphocytic leukemia of B-cell type not having achieved remission: Secondary | ICD-10-CM | POA: Diagnosis not present

## 2020-03-08 DIAGNOSIS — J343 Hypertrophy of nasal turbinates: Secondary | ICD-10-CM | POA: Diagnosis not present

## 2020-03-08 DIAGNOSIS — J309 Allergic rhinitis, unspecified: Secondary | ICD-10-CM | POA: Diagnosis not present

## 2020-03-08 DIAGNOSIS — D491 Neoplasm of unspecified behavior of respiratory system: Secondary | ICD-10-CM | POA: Diagnosis not present

## 2020-03-08 DIAGNOSIS — L03213 Periorbital cellulitis: Secondary | ICD-10-CM | POA: Diagnosis not present

## 2020-03-08 DIAGNOSIS — R000 Tachycardia, unspecified: Secondary | ICD-10-CM | POA: Diagnosis not present

## 2020-03-08 DIAGNOSIS — M1811 Unilateral primary osteoarthritis of first carpometacarpal joint, right hand: Secondary | ICD-10-CM | POA: Diagnosis not present

## 2020-03-08 DIAGNOSIS — T07XXXS Unspecified multiple injuries, sequela: Secondary | ICD-10-CM | POA: Diagnosis not present

## 2020-03-08 DIAGNOSIS — H401121 Primary open-angle glaucoma, left eye, mild stage: Secondary | ICD-10-CM | POA: Diagnosis not present

## 2020-03-08 DIAGNOSIS — N959 Unspecified menopausal and perimenopausal disorder: Secondary | ICD-10-CM | POA: Diagnosis not present

## 2020-03-08 DIAGNOSIS — E876 Hypokalemia: Secondary | ICD-10-CM | POA: Diagnosis not present

## 2020-03-08 DIAGNOSIS — D519 Vitamin B12 deficiency anemia, unspecified: Secondary | ICD-10-CM | POA: Diagnosis not present

## 2020-03-08 DIAGNOSIS — L720 Epidermal cyst: Secondary | ICD-10-CM | POA: Diagnosis not present

## 2020-03-08 DIAGNOSIS — M79602 Pain in left arm: Secondary | ICD-10-CM | POA: Diagnosis not present

## 2020-03-08 DIAGNOSIS — N529 Male erectile dysfunction, unspecified: Secondary | ICD-10-CM | POA: Diagnosis not present

## 2020-03-08 DIAGNOSIS — Y999 Unspecified external cause status: Secondary | ICD-10-CM | POA: Diagnosis not present

## 2020-03-08 DIAGNOSIS — M159 Polyosteoarthritis, unspecified: Secondary | ICD-10-CM | POA: Diagnosis not present

## 2020-03-08 DIAGNOSIS — C44329 Squamous cell carcinoma of skin of other parts of face: Secondary | ICD-10-CM | POA: Diagnosis not present

## 2020-03-08 DIAGNOSIS — M5136 Other intervertebral disc degeneration, lumbar region: Secondary | ICD-10-CM | POA: Diagnosis not present

## 2020-03-08 DIAGNOSIS — R4189 Other symptoms and signs involving cognitive functions and awareness: Secondary | ICD-10-CM | POA: Diagnosis not present

## 2020-03-08 DIAGNOSIS — R0789 Other chest pain: Secondary | ICD-10-CM | POA: Diagnosis not present

## 2020-03-08 DIAGNOSIS — H524 Presbyopia: Secondary | ICD-10-CM | POA: Diagnosis not present

## 2020-03-08 DIAGNOSIS — H0288A Meibomian gland dysfunction right eye, upper and lower eyelids: Secondary | ICD-10-CM | POA: Diagnosis not present

## 2020-03-08 DIAGNOSIS — M4710 Other spondylosis with myelopathy, site unspecified: Secondary | ICD-10-CM | POA: Diagnosis not present

## 2020-03-08 DIAGNOSIS — L0291 Cutaneous abscess, unspecified: Secondary | ICD-10-CM | POA: Diagnosis not present

## 2020-03-08 DIAGNOSIS — K769 Liver disease, unspecified: Secondary | ICD-10-CM | POA: Diagnosis not present

## 2020-03-08 DIAGNOSIS — Z7952 Long term (current) use of systemic steroids: Secondary | ICD-10-CM | POA: Diagnosis not present

## 2020-03-08 DIAGNOSIS — J0100 Acute maxillary sinusitis, unspecified: Secondary | ICD-10-CM | POA: Diagnosis not present

## 2020-03-08 DIAGNOSIS — K641 Second degree hemorrhoids: Secondary | ICD-10-CM | POA: Diagnosis not present

## 2020-03-08 DIAGNOSIS — L821 Other seborrheic keratosis: Secondary | ICD-10-CM | POA: Diagnosis not present

## 2020-03-08 DIAGNOSIS — S8001XA Contusion of right knee, initial encounter: Secondary | ICD-10-CM | POA: Diagnosis not present

## 2020-03-08 DIAGNOSIS — M25662 Stiffness of left knee, not elsewhere classified: Secondary | ICD-10-CM | POA: Diagnosis not present

## 2020-03-08 DIAGNOSIS — S72001D Fracture of unspecified part of neck of right femur, subsequent encounter for closed fracture with routine healing: Secondary | ICD-10-CM | POA: Diagnosis not present

## 2020-03-08 DIAGNOSIS — B1920 Unspecified viral hepatitis C without hepatic coma: Secondary | ICD-10-CM | POA: Diagnosis not present

## 2020-03-08 DIAGNOSIS — S41101D Unspecified open wound of right upper arm, subsequent encounter: Secondary | ICD-10-CM | POA: Diagnosis not present

## 2020-03-08 DIAGNOSIS — D692 Other nonthrombocytopenic purpura: Secondary | ICD-10-CM | POA: Diagnosis not present

## 2020-03-08 DIAGNOSIS — D2371 Other benign neoplasm of skin of right lower limb, including hip: Secondary | ICD-10-CM | POA: Diagnosis not present

## 2020-03-08 DIAGNOSIS — R140 Abdominal distension (gaseous): Secondary | ICD-10-CM | POA: Diagnosis not present

## 2020-03-08 DIAGNOSIS — G603 Idiopathic progressive neuropathy: Secondary | ICD-10-CM | POA: Diagnosis not present

## 2020-03-08 DIAGNOSIS — Z8631 Personal history of diabetic foot ulcer: Secondary | ICD-10-CM | POA: Diagnosis not present

## 2020-03-08 DIAGNOSIS — M5137 Other intervertebral disc degeneration, lumbosacral region: Secondary | ICD-10-CM | POA: Diagnosis not present

## 2020-03-08 DIAGNOSIS — H8110 Benign paroxysmal vertigo, unspecified ear: Secondary | ICD-10-CM | POA: Diagnosis not present

## 2020-03-08 DIAGNOSIS — M79674 Pain in right toe(s): Secondary | ICD-10-CM | POA: Diagnosis not present

## 2020-03-08 DIAGNOSIS — E291 Testicular hypofunction: Secondary | ICD-10-CM | POA: Diagnosis not present

## 2020-03-08 DIAGNOSIS — R202 Paresthesia of skin: Secondary | ICD-10-CM | POA: Diagnosis not present

## 2020-03-08 DIAGNOSIS — C189 Malignant neoplasm of colon, unspecified: Secondary | ICD-10-CM | POA: Diagnosis not present

## 2020-03-08 DIAGNOSIS — H18603 Keratoconus, unspecified, bilateral: Secondary | ICD-10-CM | POA: Diagnosis not present

## 2020-03-08 DIAGNOSIS — G894 Chronic pain syndrome: Secondary | ICD-10-CM | POA: Diagnosis not present

## 2020-03-08 DIAGNOSIS — M160 Bilateral primary osteoarthritis of hip: Secondary | ICD-10-CM | POA: Diagnosis not present

## 2020-03-08 DIAGNOSIS — R7303 Prediabetes: Secondary | ICD-10-CM | POA: Diagnosis not present

## 2020-03-08 DIAGNOSIS — I7774 Dissection of vertebral artery: Secondary | ICD-10-CM | POA: Diagnosis not present

## 2020-03-08 DIAGNOSIS — L97929 Non-pressure chronic ulcer of unspecified part of left lower leg with unspecified severity: Secondary | ICD-10-CM | POA: Diagnosis not present

## 2020-03-08 DIAGNOSIS — F449 Dissociative and conversion disorder, unspecified: Secondary | ICD-10-CM | POA: Diagnosis not present

## 2020-03-08 DIAGNOSIS — C3490 Malignant neoplasm of unspecified part of unspecified bronchus or lung: Secondary | ICD-10-CM | POA: Diagnosis not present

## 2020-03-08 DIAGNOSIS — R809 Proteinuria, unspecified: Secondary | ICD-10-CM | POA: Diagnosis not present

## 2020-03-08 DIAGNOSIS — Z171 Estrogen receptor negative status [ER-]: Secondary | ICD-10-CM | POA: Diagnosis not present

## 2020-03-08 DIAGNOSIS — G919 Hydrocephalus, unspecified: Secondary | ICD-10-CM | POA: Diagnosis not present

## 2020-03-08 DIAGNOSIS — L814 Other melanin hyperpigmentation: Secondary | ICD-10-CM | POA: Diagnosis not present

## 2020-03-08 DIAGNOSIS — N3000 Acute cystitis without hematuria: Secondary | ICD-10-CM | POA: Diagnosis not present

## 2020-03-08 DIAGNOSIS — L853 Xerosis cutis: Secondary | ICD-10-CM | POA: Diagnosis not present

## 2020-03-08 DIAGNOSIS — D259 Leiomyoma of uterus, unspecified: Secondary | ICD-10-CM | POA: Diagnosis not present

## 2020-03-08 DIAGNOSIS — Q8501 Neurofibromatosis, type 1: Secondary | ICD-10-CM | POA: Diagnosis not present

## 2020-03-08 DIAGNOSIS — E872 Acidosis: Secondary | ICD-10-CM | POA: Diagnosis not present

## 2020-03-08 DIAGNOSIS — M48062 Spinal stenosis, lumbar region with neurogenic claudication: Secondary | ICD-10-CM | POA: Diagnosis not present

## 2020-03-08 DIAGNOSIS — E11628 Type 2 diabetes mellitus with other skin complications: Secondary | ICD-10-CM | POA: Diagnosis not present

## 2020-03-08 DIAGNOSIS — J00 Acute nasopharyngitis [common cold]: Secondary | ICD-10-CM | POA: Diagnosis not present

## 2020-03-08 DIAGNOSIS — R7301 Impaired fasting glucose: Secondary | ICD-10-CM | POA: Diagnosis not present

## 2020-03-08 DIAGNOSIS — C4371 Malignant melanoma of right lower limb, including hip: Secondary | ICD-10-CM | POA: Diagnosis not present

## 2020-03-08 DIAGNOSIS — E213 Hyperparathyroidism, unspecified: Secondary | ICD-10-CM | POA: Diagnosis not present

## 2020-03-08 DIAGNOSIS — E042 Nontoxic multinodular goiter: Secondary | ICD-10-CM | POA: Diagnosis not present

## 2020-03-08 DIAGNOSIS — I428 Other cardiomyopathies: Secondary | ICD-10-CM | POA: Diagnosis not present

## 2020-03-08 DIAGNOSIS — D124 Benign neoplasm of descending colon: Secondary | ICD-10-CM | POA: Diagnosis not present

## 2020-03-08 DIAGNOSIS — Z7189 Other specified counseling: Secondary | ICD-10-CM | POA: Diagnosis not present

## 2020-03-08 DIAGNOSIS — D500 Iron deficiency anemia secondary to blood loss (chronic): Secondary | ICD-10-CM | POA: Diagnosis not present

## 2020-03-08 DIAGNOSIS — Z13820 Encounter for screening for osteoporosis: Secondary | ICD-10-CM | POA: Diagnosis not present

## 2020-03-08 DIAGNOSIS — M2030 Hallux varus (acquired), unspecified foot: Secondary | ICD-10-CM | POA: Diagnosis not present

## 2020-03-08 DIAGNOSIS — I129 Hypertensive chronic kidney disease with stage 1 through stage 4 chronic kidney disease, or unspecified chronic kidney disease: Secondary | ICD-10-CM | POA: Diagnosis not present

## 2020-03-08 DIAGNOSIS — H33301 Unspecified retinal break, right eye: Secondary | ICD-10-CM | POA: Diagnosis not present

## 2020-03-08 DIAGNOSIS — Z882 Allergy status to sulfonamides status: Secondary | ICD-10-CM | POA: Diagnosis not present

## 2020-03-08 DIAGNOSIS — Z96649 Presence of unspecified artificial hip joint: Secondary | ICD-10-CM | POA: Diagnosis not present

## 2020-03-08 DIAGNOSIS — M8580 Other specified disorders of bone density and structure, unspecified site: Secondary | ICD-10-CM | POA: Diagnosis not present

## 2020-03-08 DIAGNOSIS — Y842 Radiological procedure and radiotherapy as the cause of abnormal reaction of the patient, or of later complication, without mention of misadventure at the time of the procedure: Secondary | ICD-10-CM | POA: Diagnosis not present

## 2020-03-08 DIAGNOSIS — M14672 Charcot's joint, left ankle and foot: Secondary | ICD-10-CM | POA: Diagnosis not present

## 2020-03-08 DIAGNOSIS — J4520 Mild intermittent asthma, uncomplicated: Secondary | ICD-10-CM | POA: Diagnosis not present

## 2020-03-08 DIAGNOSIS — M6752 Plica syndrome, left knee: Secondary | ICD-10-CM | POA: Diagnosis not present

## 2020-03-08 DIAGNOSIS — Z8719 Personal history of other diseases of the digestive system: Secondary | ICD-10-CM | POA: Diagnosis not present

## 2020-03-08 DIAGNOSIS — R001 Bradycardia, unspecified: Secondary | ICD-10-CM | POA: Diagnosis not present

## 2020-03-08 DIAGNOSIS — I471 Supraventricular tachycardia: Secondary | ICD-10-CM | POA: Diagnosis not present

## 2020-03-08 DIAGNOSIS — C3491 Malignant neoplasm of unspecified part of right bronchus or lung: Secondary | ICD-10-CM | POA: Diagnosis not present

## 2020-03-08 DIAGNOSIS — E871 Hypo-osmolality and hyponatremia: Secondary | ICD-10-CM | POA: Diagnosis not present

## 2020-03-08 DIAGNOSIS — T783XXA Angioneurotic edema, initial encounter: Secondary | ICD-10-CM | POA: Diagnosis not present

## 2020-03-08 DIAGNOSIS — S0101XS Laceration without foreign body of scalp, sequela: Secondary | ICD-10-CM | POA: Diagnosis not present

## 2020-03-08 DIAGNOSIS — F439 Reaction to severe stress, unspecified: Secondary | ICD-10-CM | POA: Diagnosis not present

## 2020-03-08 DIAGNOSIS — M5124 Other intervertebral disc displacement, thoracic region: Secondary | ICD-10-CM | POA: Diagnosis not present

## 2020-03-08 DIAGNOSIS — M7582 Other shoulder lesions, left shoulder: Secondary | ICD-10-CM | POA: Diagnosis not present

## 2020-03-08 DIAGNOSIS — H401130 Primary open-angle glaucoma, bilateral, stage unspecified: Secondary | ICD-10-CM | POA: Diagnosis not present

## 2020-03-08 DIAGNOSIS — D1800 Hemangioma unspecified site: Secondary | ICD-10-CM | POA: Diagnosis not present

## 2020-03-08 DIAGNOSIS — R921 Mammographic calcification found on diagnostic imaging of breast: Secondary | ICD-10-CM | POA: Diagnosis not present

## 2020-03-08 DIAGNOSIS — T1501XD Foreign body in cornea, right eye, subsequent encounter: Secondary | ICD-10-CM | POA: Diagnosis not present

## 2020-03-08 DIAGNOSIS — E1140 Type 2 diabetes mellitus with diabetic neuropathy, unspecified: Secondary | ICD-10-CM | POA: Diagnosis not present

## 2020-03-08 DIAGNOSIS — I482 Chronic atrial fibrillation, unspecified: Secondary | ICD-10-CM | POA: Diagnosis not present

## 2020-03-08 DIAGNOSIS — R152 Fecal urgency: Secondary | ICD-10-CM | POA: Diagnosis not present

## 2020-03-08 DIAGNOSIS — E2839 Other primary ovarian failure: Secondary | ICD-10-CM | POA: Diagnosis not present

## 2020-03-08 DIAGNOSIS — M1812 Unilateral primary osteoarthritis of first carpometacarpal joint, left hand: Secondary | ICD-10-CM | POA: Diagnosis not present

## 2020-03-08 DIAGNOSIS — M47811 Spondylosis without myelopathy or radiculopathy, occipito-atlanto-axial region: Secondary | ICD-10-CM | POA: Diagnosis not present

## 2020-03-08 DIAGNOSIS — R519 Headache, unspecified: Secondary | ICD-10-CM | POA: Diagnosis not present

## 2020-03-08 DIAGNOSIS — K219 Gastro-esophageal reflux disease without esophagitis: Secondary | ICD-10-CM | POA: Diagnosis not present

## 2020-03-08 DIAGNOSIS — X58XXXA Exposure to other specified factors, initial encounter: Secondary | ICD-10-CM | POA: Diagnosis not present

## 2020-03-08 DIAGNOSIS — M5432 Sciatica, left side: Secondary | ICD-10-CM | POA: Diagnosis not present

## 2020-03-08 DIAGNOSIS — I252 Old myocardial infarction: Secondary | ICD-10-CM | POA: Diagnosis not present

## 2020-03-08 DIAGNOSIS — Z6831 Body mass index (BMI) 31.0-31.9, adult: Secondary | ICD-10-CM | POA: Diagnosis not present

## 2020-03-08 DIAGNOSIS — E669 Obesity, unspecified: Secondary | ICD-10-CM | POA: Diagnosis not present

## 2020-03-08 DIAGNOSIS — R609 Edema, unspecified: Secondary | ICD-10-CM | POA: Diagnosis not present

## 2020-03-08 DIAGNOSIS — I25810 Atherosclerosis of coronary artery bypass graft(s) without angina pectoris: Secondary | ICD-10-CM | POA: Diagnosis not present

## 2020-03-08 DIAGNOSIS — M5030 Other cervical disc degeneration, unspecified cervical region: Secondary | ICD-10-CM | POA: Diagnosis not present

## 2020-03-08 DIAGNOSIS — B029 Zoster without complications: Secondary | ICD-10-CM | POA: Diagnosis not present

## 2020-03-08 DIAGNOSIS — R87810 Cervical high risk human papillomavirus (HPV) DNA test positive: Secondary | ICD-10-CM | POA: Diagnosis not present

## 2020-03-08 DIAGNOSIS — M85851 Other specified disorders of bone density and structure, right thigh: Secondary | ICD-10-CM | POA: Diagnosis not present

## 2020-03-08 DIAGNOSIS — D72810 Lymphocytopenia: Secondary | ICD-10-CM | POA: Diagnosis not present

## 2020-03-08 DIAGNOSIS — M0609 Rheumatoid arthritis without rheumatoid factor, multiple sites: Secondary | ICD-10-CM | POA: Diagnosis not present

## 2020-03-08 DIAGNOSIS — K52839 Microscopic colitis, unspecified: Secondary | ICD-10-CM | POA: Diagnosis not present

## 2020-03-08 DIAGNOSIS — Z515 Encounter for palliative care: Secondary | ICD-10-CM | POA: Diagnosis not present

## 2020-03-08 DIAGNOSIS — F4481 Dissociative identity disorder: Secondary | ICD-10-CM | POA: Diagnosis not present

## 2020-03-08 DIAGNOSIS — I6203 Nontraumatic chronic subdural hemorrhage: Secondary | ICD-10-CM | POA: Diagnosis not present

## 2020-03-08 DIAGNOSIS — M5440 Lumbago with sciatica, unspecified side: Secondary | ICD-10-CM | POA: Diagnosis not present

## 2020-03-08 DIAGNOSIS — R41841 Cognitive communication deficit: Secondary | ICD-10-CM | POA: Diagnosis not present

## 2020-03-08 DIAGNOSIS — M2550 Pain in unspecified joint: Secondary | ICD-10-CM | POA: Diagnosis not present

## 2020-03-08 DIAGNOSIS — E890 Postprocedural hypothyroidism: Secondary | ICD-10-CM | POA: Diagnosis not present

## 2020-03-08 DIAGNOSIS — Z8371 Family history of colonic polyps: Secondary | ICD-10-CM | POA: Diagnosis not present

## 2020-03-08 DIAGNOSIS — T3177 Burns involving 70-79% of body surface with 70-79% third degree burns: Secondary | ICD-10-CM | POA: Diagnosis not present

## 2020-03-08 DIAGNOSIS — I63212 Cerebral infarction due to unspecified occlusion or stenosis of left vertebral arteries: Secondary | ICD-10-CM | POA: Diagnosis not present

## 2020-03-08 DIAGNOSIS — C719 Malignant neoplasm of brain, unspecified: Secondary | ICD-10-CM | POA: Diagnosis not present

## 2020-03-08 DIAGNOSIS — I69320 Aphasia following cerebral infarction: Secondary | ICD-10-CM | POA: Diagnosis not present

## 2020-03-08 DIAGNOSIS — M545 Low back pain: Secondary | ICD-10-CM | POA: Diagnosis not present

## 2020-03-08 DIAGNOSIS — R200 Anesthesia of skin: Secondary | ICD-10-CM | POA: Diagnosis not present

## 2020-03-08 DIAGNOSIS — H02834 Dermatochalasis of left upper eyelid: Secondary | ICD-10-CM | POA: Diagnosis not present

## 2020-03-08 DIAGNOSIS — L219 Seborrheic dermatitis, unspecified: Secondary | ICD-10-CM | POA: Diagnosis not present

## 2020-03-08 DIAGNOSIS — N312 Flaccid neuropathic bladder, not elsewhere classified: Secondary | ICD-10-CM | POA: Diagnosis not present

## 2020-03-08 DIAGNOSIS — G4710 Hypersomnia, unspecified: Secondary | ICD-10-CM | POA: Diagnosis not present

## 2020-03-08 DIAGNOSIS — L89892 Pressure ulcer of other site, stage 2: Secondary | ICD-10-CM | POA: Diagnosis not present

## 2020-03-08 DIAGNOSIS — H401433 Capsular glaucoma with pseudoexfoliation of lens, bilateral, severe stage: Secondary | ICD-10-CM | POA: Diagnosis not present

## 2020-03-08 DIAGNOSIS — R0602 Shortness of breath: Secondary | ICD-10-CM | POA: Diagnosis not present

## 2020-03-08 DIAGNOSIS — M67912 Unspecified disorder of synovium and tendon, left shoulder: Secondary | ICD-10-CM | POA: Diagnosis not present

## 2020-03-08 DIAGNOSIS — I8511 Secondary esophageal varices with bleeding: Secondary | ICD-10-CM | POA: Diagnosis not present

## 2020-03-08 DIAGNOSIS — Z8781 Personal history of (healed) traumatic fracture: Secondary | ICD-10-CM | POA: Diagnosis not present

## 2020-03-08 DIAGNOSIS — E119 Type 2 diabetes mellitus without complications: Secondary | ICD-10-CM | POA: Diagnosis not present

## 2020-03-08 DIAGNOSIS — Z9221 Personal history of antineoplastic chemotherapy: Secondary | ICD-10-CM | POA: Diagnosis not present

## 2020-03-08 DIAGNOSIS — R29810 Facial weakness: Secondary | ICD-10-CM | POA: Diagnosis not present

## 2020-03-08 DIAGNOSIS — I5033 Acute on chronic diastolic (congestive) heart failure: Secondary | ICD-10-CM | POA: Diagnosis not present

## 2020-03-08 DIAGNOSIS — D508 Other iron deficiency anemias: Secondary | ICD-10-CM | POA: Diagnosis not present

## 2020-03-08 DIAGNOSIS — R293 Abnormal posture: Secondary | ICD-10-CM | POA: Diagnosis not present

## 2020-03-08 DIAGNOSIS — M25411 Effusion, right shoulder: Secondary | ICD-10-CM | POA: Diagnosis not present

## 2020-03-08 DIAGNOSIS — K7581 Nonalcoholic steatohepatitis (NASH): Secondary | ICD-10-CM | POA: Diagnosis not present

## 2020-03-08 DIAGNOSIS — H1789 Other corneal scars and opacities: Secondary | ICD-10-CM | POA: Diagnosis not present

## 2020-03-08 DIAGNOSIS — I2729 Other secondary pulmonary hypertension: Secondary | ICD-10-CM | POA: Diagnosis not present

## 2020-03-08 DIAGNOSIS — S8012XA Contusion of left lower leg, initial encounter: Secondary | ICD-10-CM | POA: Diagnosis not present

## 2020-03-08 DIAGNOSIS — R6889 Other general symptoms and signs: Secondary | ICD-10-CM | POA: Diagnosis not present

## 2020-03-08 DIAGNOSIS — R269 Unspecified abnormalities of gait and mobility: Secondary | ICD-10-CM | POA: Diagnosis not present

## 2020-03-08 DIAGNOSIS — H16001 Unspecified corneal ulcer, right eye: Secondary | ICD-10-CM | POA: Diagnosis not present

## 2020-03-08 DIAGNOSIS — S14109S Unspecified injury at unspecified level of cervical spinal cord, sequela: Secondary | ICD-10-CM | POA: Diagnosis not present

## 2020-03-08 DIAGNOSIS — R5382 Chronic fatigue, unspecified: Secondary | ICD-10-CM | POA: Diagnosis not present

## 2020-03-08 DIAGNOSIS — M109 Gout, unspecified: Secondary | ICD-10-CM | POA: Diagnosis not present

## 2020-03-08 DIAGNOSIS — M4726 Other spondylosis with radiculopathy, lumbar region: Secondary | ICD-10-CM | POA: Diagnosis not present

## 2020-03-08 DIAGNOSIS — R600 Localized edema: Secondary | ICD-10-CM | POA: Diagnosis not present

## 2020-03-08 DIAGNOSIS — R7401 Elevation of levels of liver transaminase levels: Secondary | ICD-10-CM | POA: Diagnosis not present

## 2020-03-08 DIAGNOSIS — R29704 NIHSS score 4: Secondary | ICD-10-CM | POA: Diagnosis not present

## 2020-03-08 DIAGNOSIS — L718 Other rosacea: Secondary | ICD-10-CM | POA: Diagnosis not present

## 2020-03-08 DIAGNOSIS — M47896 Other spondylosis, lumbar region: Secondary | ICD-10-CM | POA: Diagnosis not present

## 2020-03-08 DIAGNOSIS — D123 Benign neoplasm of transverse colon: Secondary | ICD-10-CM | POA: Diagnosis not present

## 2020-03-08 DIAGNOSIS — R21 Rash and other nonspecific skin eruption: Secondary | ICD-10-CM | POA: Diagnosis not present

## 2020-03-08 DIAGNOSIS — I712 Thoracic aortic aneurysm, without rupture: Secondary | ICD-10-CM | POA: Diagnosis not present

## 2020-03-08 DIAGNOSIS — J189 Pneumonia, unspecified organism: Secondary | ICD-10-CM | POA: Diagnosis not present

## 2020-03-08 DIAGNOSIS — M79644 Pain in right finger(s): Secondary | ICD-10-CM | POA: Diagnosis not present

## 2020-03-08 DIAGNOSIS — S3992XD Unspecified injury of lower back, subsequent encounter: Secondary | ICD-10-CM | POA: Diagnosis not present

## 2020-03-08 DIAGNOSIS — S86012A Strain of left Achilles tendon, initial encounter: Secondary | ICD-10-CM | POA: Diagnosis not present

## 2020-03-08 DIAGNOSIS — H2513 Age-related nuclear cataract, bilateral: Secondary | ICD-10-CM | POA: Diagnosis not present

## 2020-03-08 DIAGNOSIS — F5101 Primary insomnia: Secondary | ICD-10-CM | POA: Diagnosis not present

## 2020-03-08 DIAGNOSIS — I160 Hypertensive urgency: Secondary | ICD-10-CM | POA: Diagnosis not present

## 2020-03-08 DIAGNOSIS — K2100 Gastro-esophageal reflux disease with esophagitis, without bleeding: Secondary | ICD-10-CM | POA: Diagnosis not present

## 2020-03-08 DIAGNOSIS — G8911 Acute pain due to trauma: Secondary | ICD-10-CM | POA: Diagnosis not present

## 2020-03-08 DIAGNOSIS — R8789 Other abnormal findings in specimens from female genital organs: Secondary | ICD-10-CM | POA: Diagnosis not present

## 2020-03-08 DIAGNOSIS — M999 Biomechanical lesion, unspecified: Secondary | ICD-10-CM | POA: Diagnosis not present

## 2020-03-08 DIAGNOSIS — F429 Obsessive-compulsive disorder, unspecified: Secondary | ICD-10-CM | POA: Diagnosis not present

## 2020-03-08 DIAGNOSIS — N202 Calculus of kidney with calculus of ureter: Secondary | ICD-10-CM | POA: Diagnosis not present

## 2020-03-08 DIAGNOSIS — F339 Major depressive disorder, recurrent, unspecified: Secondary | ICD-10-CM | POA: Diagnosis not present

## 2020-03-08 DIAGNOSIS — W19XXXA Unspecified fall, initial encounter: Secondary | ICD-10-CM | POA: Diagnosis not present

## 2020-03-08 DIAGNOSIS — J019 Acute sinusitis, unspecified: Secondary | ICD-10-CM | POA: Diagnosis not present

## 2020-03-08 DIAGNOSIS — H35372 Puckering of macula, left eye: Secondary | ICD-10-CM | POA: Diagnosis not present

## 2020-03-08 DIAGNOSIS — G20 Parkinson's disease: Secondary | ICD-10-CM | POA: Diagnosis not present

## 2020-03-08 DIAGNOSIS — F17200 Nicotine dependence, unspecified, uncomplicated: Secondary | ICD-10-CM | POA: Diagnosis not present

## 2020-03-08 DIAGNOSIS — Z20828 Contact with and (suspected) exposure to other viral communicable diseases: Secondary | ICD-10-CM | POA: Diagnosis not present

## 2020-03-08 DIAGNOSIS — H0100A Unspecified blepharitis right eye, upper and lower eyelids: Secondary | ICD-10-CM | POA: Diagnosis not present

## 2020-03-08 DIAGNOSIS — F4001 Agoraphobia with panic disorder: Secondary | ICD-10-CM | POA: Diagnosis not present

## 2020-03-08 DIAGNOSIS — S42221D 2-part displaced fracture of surgical neck of right humerus, subsequent encounter for fracture with routine healing: Secondary | ICD-10-CM | POA: Diagnosis not present

## 2020-03-08 DIAGNOSIS — Z8546 Personal history of malignant neoplasm of prostate: Secondary | ICD-10-CM | POA: Diagnosis not present

## 2020-03-08 DIAGNOSIS — F39 Unspecified mood [affective] disorder: Secondary | ICD-10-CM | POA: Diagnosis not present

## 2020-03-08 DIAGNOSIS — M150 Primary generalized (osteo)arthritis: Secondary | ICD-10-CM | POA: Diagnosis not present

## 2020-03-08 DIAGNOSIS — M25551 Pain in right hip: Secondary | ICD-10-CM | POA: Diagnosis not present

## 2020-03-08 DIAGNOSIS — Z853 Personal history of malignant neoplasm of breast: Secondary | ICD-10-CM | POA: Diagnosis not present

## 2020-03-08 DIAGNOSIS — F209 Schizophrenia, unspecified: Secondary | ICD-10-CM | POA: Diagnosis not present

## 2020-03-08 DIAGNOSIS — I4892 Unspecified atrial flutter: Secondary | ICD-10-CM | POA: Diagnosis not present

## 2020-03-08 DIAGNOSIS — R972 Elevated prostate specific antigen [PSA]: Secondary | ICD-10-CM | POA: Diagnosis not present

## 2020-03-08 DIAGNOSIS — L578 Other skin changes due to chronic exposure to nonionizing radiation: Secondary | ICD-10-CM | POA: Diagnosis not present

## 2020-03-08 DIAGNOSIS — S92002D Unspecified fracture of left calcaneus, subsequent encounter for fracture with routine healing: Secondary | ICD-10-CM | POA: Diagnosis not present

## 2020-03-08 DIAGNOSIS — B349 Viral infection, unspecified: Secondary | ICD-10-CM | POA: Diagnosis not present

## 2020-03-08 DIAGNOSIS — Z6828 Body mass index (BMI) 28.0-28.9, adult: Secondary | ICD-10-CM | POA: Diagnosis not present

## 2020-03-08 DIAGNOSIS — Z6838 Body mass index (BMI) 38.0-38.9, adult: Secondary | ICD-10-CM | POA: Diagnosis not present

## 2020-03-08 DIAGNOSIS — L650 Telogen effluvium: Secondary | ICD-10-CM | POA: Diagnosis not present

## 2020-03-08 DIAGNOSIS — C50112 Malignant neoplasm of central portion of left female breast: Secondary | ICD-10-CM | POA: Diagnosis not present

## 2020-03-08 DIAGNOSIS — J431 Panlobular emphysema: Secondary | ICD-10-CM | POA: Diagnosis not present

## 2020-03-08 DIAGNOSIS — Z950 Presence of cardiac pacemaker: Secondary | ICD-10-CM | POA: Diagnosis not present

## 2020-03-08 DIAGNOSIS — G510 Bell's palsy: Secondary | ICD-10-CM | POA: Diagnosis not present

## 2020-03-08 DIAGNOSIS — Z01419 Encounter for gynecological examination (general) (routine) without abnormal findings: Secondary | ICD-10-CM | POA: Diagnosis not present

## 2020-03-08 DIAGNOSIS — Z885 Allergy status to narcotic agent status: Secondary | ICD-10-CM | POA: Diagnosis not present

## 2020-03-08 DIAGNOSIS — E113593 Type 2 diabetes mellitus with proliferative diabetic retinopathy without macular edema, bilateral: Secondary | ICD-10-CM | POA: Diagnosis not present

## 2020-03-08 DIAGNOSIS — R69 Illness, unspecified: Secondary | ICD-10-CM | POA: Diagnosis not present

## 2020-03-08 DIAGNOSIS — R292 Abnormal reflex: Secondary | ICD-10-CM | POA: Diagnosis not present

## 2020-03-08 DIAGNOSIS — Z801 Family history of malignant neoplasm of trachea, bronchus and lung: Secondary | ICD-10-CM | POA: Diagnosis not present

## 2020-03-08 DIAGNOSIS — A084 Viral intestinal infection, unspecified: Secondary | ICD-10-CM | POA: Diagnosis not present

## 2020-03-08 DIAGNOSIS — M530 Cervicocranial syndrome: Secondary | ICD-10-CM | POA: Diagnosis not present

## 2020-03-08 DIAGNOSIS — J439 Emphysema, unspecified: Secondary | ICD-10-CM | POA: Diagnosis not present

## 2020-03-08 DIAGNOSIS — T25221A Burn of second degree of right foot, initial encounter: Secondary | ICD-10-CM | POA: Diagnosis not present

## 2020-03-08 DIAGNOSIS — Z954 Presence of other heart-valve replacement: Secondary | ICD-10-CM | POA: Diagnosis not present

## 2020-03-08 DIAGNOSIS — Z79899 Other long term (current) drug therapy: Secondary | ICD-10-CM | POA: Diagnosis not present

## 2020-03-08 DIAGNOSIS — S46112D Strain of muscle, fascia and tendon of long head of biceps, left arm, subsequent encounter: Secondary | ICD-10-CM | POA: Diagnosis not present

## 2020-03-08 DIAGNOSIS — M1712 Unilateral primary osteoarthritis, left knee: Secondary | ICD-10-CM | POA: Diagnosis not present

## 2020-03-08 DIAGNOSIS — L218 Other seborrheic dermatitis: Secondary | ICD-10-CM | POA: Diagnosis not present

## 2020-03-08 DIAGNOSIS — D1801 Hemangioma of skin and subcutaneous tissue: Secondary | ICD-10-CM | POA: Diagnosis not present

## 2020-03-08 DIAGNOSIS — M1611 Unilateral primary osteoarthritis, right hip: Secondary | ICD-10-CM | POA: Diagnosis not present

## 2020-03-08 DIAGNOSIS — Q828 Other specified congenital malformations of skin: Secondary | ICD-10-CM | POA: Diagnosis not present

## 2020-03-08 DIAGNOSIS — Z789 Other specified health status: Secondary | ICD-10-CM | POA: Diagnosis not present

## 2020-03-08 DIAGNOSIS — H35343 Macular cyst, hole, or pseudohole, bilateral: Secondary | ICD-10-CM | POA: Diagnosis not present

## 2020-03-08 DIAGNOSIS — R1013 Epigastric pain: Secondary | ICD-10-CM | POA: Diagnosis not present

## 2020-03-08 DIAGNOSIS — N201 Calculus of ureter: Secondary | ICD-10-CM | POA: Diagnosis not present

## 2020-03-08 DIAGNOSIS — K8020 Calculus of gallbladder without cholecystitis without obstruction: Secondary | ICD-10-CM | POA: Diagnosis not present

## 2020-03-08 DIAGNOSIS — D329 Benign neoplasm of meninges, unspecified: Secondary | ICD-10-CM | POA: Diagnosis not present

## 2020-03-08 DIAGNOSIS — G5600 Carpal tunnel syndrome, unspecified upper limb: Secondary | ICD-10-CM | POA: Diagnosis not present

## 2020-03-08 DIAGNOSIS — Z888 Allergy status to other drugs, medicaments and biological substances status: Secondary | ICD-10-CM | POA: Diagnosis not present

## 2020-03-08 DIAGNOSIS — Z1331 Encounter for screening for depression: Secondary | ICD-10-CM | POA: Diagnosis not present

## 2020-03-08 DIAGNOSIS — M94262 Chondromalacia, left knee: Secondary | ICD-10-CM | POA: Diagnosis not present

## 2020-03-08 DIAGNOSIS — L97421 Non-pressure chronic ulcer of left heel and midfoot limited to breakdown of skin: Secondary | ICD-10-CM | POA: Diagnosis not present

## 2020-03-08 DIAGNOSIS — I7300 Raynaud's syndrome without gangrene: Secondary | ICD-10-CM | POA: Diagnosis not present

## 2020-03-08 DIAGNOSIS — R413 Other amnesia: Secondary | ICD-10-CM | POA: Diagnosis not present

## 2020-03-08 DIAGNOSIS — A498 Other bacterial infections of unspecified site: Secondary | ICD-10-CM | POA: Diagnosis not present

## 2020-03-08 DIAGNOSIS — M79609 Pain in unspecified limb: Secondary | ICD-10-CM | POA: Diagnosis not present

## 2020-03-08 DIAGNOSIS — R1012 Left upper quadrant pain: Secondary | ICD-10-CM | POA: Diagnosis not present

## 2020-03-08 DIAGNOSIS — M6289 Other specified disorders of muscle: Secondary | ICD-10-CM | POA: Diagnosis not present

## 2020-03-08 DIAGNOSIS — I420 Dilated cardiomyopathy: Secondary | ICD-10-CM | POA: Diagnosis not present

## 2020-03-08 DIAGNOSIS — I059 Rheumatic mitral valve disease, unspecified: Secondary | ICD-10-CM | POA: Diagnosis not present

## 2020-03-08 DIAGNOSIS — M25531 Pain in right wrist: Secondary | ICD-10-CM | POA: Diagnosis not present

## 2020-03-08 DIAGNOSIS — R0989 Other specified symptoms and signs involving the circulatory and respiratory systems: Secondary | ICD-10-CM | POA: Diagnosis not present

## 2020-03-08 DIAGNOSIS — S22000D Wedge compression fracture of unspecified thoracic vertebra, subsequent encounter for fracture with routine healing: Secondary | ICD-10-CM | POA: Diagnosis not present

## 2020-03-08 DIAGNOSIS — R232 Flushing: Secondary | ICD-10-CM | POA: Diagnosis not present

## 2020-03-08 DIAGNOSIS — M419 Scoliosis, unspecified: Secondary | ICD-10-CM | POA: Diagnosis not present

## 2020-03-08 DIAGNOSIS — E11319 Type 2 diabetes mellitus with unspecified diabetic retinopathy without macular edema: Secondary | ICD-10-CM | POA: Diagnosis not present

## 2020-03-08 DIAGNOSIS — G723 Periodic paralysis: Secondary | ICD-10-CM | POA: Diagnosis not present

## 2020-03-08 DIAGNOSIS — I77811 Abdominal aortic ectasia: Secondary | ICD-10-CM | POA: Diagnosis not present

## 2020-03-08 DIAGNOSIS — M79642 Pain in left hand: Secondary | ICD-10-CM | POA: Diagnosis not present

## 2020-03-08 DIAGNOSIS — N183 Chronic kidney disease, stage 3 unspecified: Secondary | ICD-10-CM | POA: Diagnosis not present

## 2020-03-08 DIAGNOSIS — K50119 Crohn's disease of large intestine with unspecified complications: Secondary | ICD-10-CM | POA: Diagnosis not present

## 2020-03-08 DIAGNOSIS — N185 Chronic kidney disease, stage 5: Secondary | ICD-10-CM | POA: Diagnosis not present

## 2020-03-08 DIAGNOSIS — D2261 Melanocytic nevi of right upper limb, including shoulder: Secondary | ICD-10-CM | POA: Diagnosis not present

## 2020-03-08 DIAGNOSIS — E349 Endocrine disorder, unspecified: Secondary | ICD-10-CM | POA: Diagnosis not present

## 2020-03-08 DIAGNOSIS — Z5111 Encounter for antineoplastic chemotherapy: Secondary | ICD-10-CM | POA: Diagnosis not present

## 2020-03-08 DIAGNOSIS — Z9981 Dependence on supplemental oxygen: Secondary | ICD-10-CM | POA: Diagnosis not present

## 2020-03-08 DIAGNOSIS — Z992 Dependence on renal dialysis: Secondary | ICD-10-CM | POA: Diagnosis not present

## 2020-03-08 DIAGNOSIS — R279 Unspecified lack of coordination: Secondary | ICD-10-CM | POA: Diagnosis not present

## 2020-03-08 DIAGNOSIS — M7989 Other specified soft tissue disorders: Secondary | ICD-10-CM | POA: Diagnosis not present

## 2020-03-08 DIAGNOSIS — M129 Arthropathy, unspecified: Secondary | ICD-10-CM | POA: Diagnosis not present

## 2020-03-08 DIAGNOSIS — H40053 Ocular hypertension, bilateral: Secondary | ICD-10-CM | POA: Diagnosis not present

## 2020-03-08 DIAGNOSIS — M479 Spondylosis, unspecified: Secondary | ICD-10-CM | POA: Diagnosis not present

## 2020-03-08 DIAGNOSIS — I4811 Longstanding persistent atrial fibrillation: Secondary | ICD-10-CM | POA: Diagnosis not present

## 2020-03-08 DIAGNOSIS — Z713 Dietary counseling and surveillance: Secondary | ICD-10-CM | POA: Diagnosis not present

## 2020-03-08 DIAGNOSIS — G5701 Lesion of sciatic nerve, right lower limb: Secondary | ICD-10-CM | POA: Diagnosis not present

## 2020-03-08 DIAGNOSIS — S72011A Unspecified intracapsular fracture of right femur, initial encounter for closed fracture: Secondary | ICD-10-CM | POA: Diagnosis not present

## 2020-03-08 DIAGNOSIS — Z955 Presence of coronary angioplasty implant and graft: Secondary | ICD-10-CM | POA: Diagnosis not present

## 2020-03-08 DIAGNOSIS — M0589 Other rheumatoid arthritis with rheumatoid factor of multiple sites: Secondary | ICD-10-CM | POA: Diagnosis not present

## 2020-03-08 DIAGNOSIS — H02831 Dermatochalasis of right upper eyelid: Secondary | ICD-10-CM | POA: Diagnosis not present

## 2020-03-08 DIAGNOSIS — Z9884 Bariatric surgery status: Secondary | ICD-10-CM | POA: Diagnosis not present

## 2020-03-08 DIAGNOSIS — S32411S Displaced fracture of anterior wall of right acetabulum, sequela: Secondary | ICD-10-CM | POA: Diagnosis not present

## 2020-03-08 DIAGNOSIS — R1011 Right upper quadrant pain: Secondary | ICD-10-CM | POA: Diagnosis not present

## 2020-03-08 DIAGNOSIS — E034 Atrophy of thyroid (acquired): Secondary | ICD-10-CM | POA: Diagnosis not present

## 2020-03-08 DIAGNOSIS — I309 Acute pericarditis, unspecified: Secondary | ICD-10-CM | POA: Diagnosis not present

## 2020-03-08 DIAGNOSIS — H353231 Exudative age-related macular degeneration, bilateral, with active choroidal neovascularization: Secondary | ICD-10-CM | POA: Diagnosis not present

## 2020-03-08 DIAGNOSIS — G2581 Restless legs syndrome: Secondary | ICD-10-CM | POA: Diagnosis not present

## 2020-03-08 DIAGNOSIS — R918 Other nonspecific abnormal finding of lung field: Secondary | ICD-10-CM | POA: Diagnosis not present

## 2020-03-08 DIAGNOSIS — R062 Wheezing: Secondary | ICD-10-CM | POA: Diagnosis not present

## 2020-03-08 DIAGNOSIS — D4989 Neoplasm of unspecified behavior of other specified sites: Secondary | ICD-10-CM | POA: Diagnosis not present

## 2020-03-08 DIAGNOSIS — I4821 Permanent atrial fibrillation: Secondary | ICD-10-CM | POA: Diagnosis not present

## 2020-03-08 DIAGNOSIS — C20 Malignant neoplasm of rectum: Secondary | ICD-10-CM | POA: Diagnosis not present

## 2020-03-08 DIAGNOSIS — R4702 Dysphasia: Secondary | ICD-10-CM | POA: Diagnosis not present

## 2020-03-08 DIAGNOSIS — R0609 Other forms of dyspnea: Secondary | ICD-10-CM | POA: Diagnosis not present

## 2020-03-08 DIAGNOSIS — Z89511 Acquired absence of right leg below knee: Secondary | ICD-10-CM | POA: Diagnosis not present

## 2020-03-08 DIAGNOSIS — J4521 Mild intermittent asthma with (acute) exacerbation: Secondary | ICD-10-CM | POA: Diagnosis not present

## 2020-03-08 DIAGNOSIS — R591 Generalized enlarged lymph nodes: Secondary | ICD-10-CM | POA: Diagnosis not present

## 2020-03-08 DIAGNOSIS — I4891 Unspecified atrial fibrillation: Secondary | ICD-10-CM | POA: Diagnosis not present

## 2020-03-08 DIAGNOSIS — R9089 Other abnormal findings on diagnostic imaging of central nervous system: Secondary | ICD-10-CM | POA: Diagnosis not present

## 2020-03-08 DIAGNOSIS — Z510 Encounter for antineoplastic radiation therapy: Secondary | ICD-10-CM | POA: Diagnosis not present

## 2020-03-08 DIAGNOSIS — I77819 Aortic ectasia, unspecified site: Secondary | ICD-10-CM | POA: Diagnosis not present

## 2020-03-08 DIAGNOSIS — G8929 Other chronic pain: Secondary | ICD-10-CM | POA: Diagnosis not present

## 2020-03-08 DIAGNOSIS — R944 Abnormal results of kidney function studies: Secondary | ICD-10-CM | POA: Diagnosis not present

## 2020-03-08 DIAGNOSIS — Z90710 Acquired absence of both cervix and uterus: Secondary | ICD-10-CM | POA: Diagnosis not present

## 2020-03-08 DIAGNOSIS — F411 Generalized anxiety disorder: Secondary | ICD-10-CM | POA: Diagnosis not present

## 2020-03-08 DIAGNOSIS — M47816 Spondylosis without myelopathy or radiculopathy, lumbar region: Secondary | ICD-10-CM | POA: Diagnosis not present

## 2020-03-08 DIAGNOSIS — K118 Other diseases of salivary glands: Secondary | ICD-10-CM | POA: Diagnosis not present

## 2020-03-08 DIAGNOSIS — H401131 Primary open-angle glaucoma, bilateral, mild stage: Secondary | ICD-10-CM | POA: Diagnosis not present

## 2020-03-08 DIAGNOSIS — H16223 Keratoconjunctivitis sicca, not specified as Sjogren's, bilateral: Secondary | ICD-10-CM | POA: Diagnosis not present

## 2020-03-08 DIAGNOSIS — K449 Diaphragmatic hernia without obstruction or gangrene: Secondary | ICD-10-CM | POA: Diagnosis not present

## 2020-03-08 DIAGNOSIS — J90 Pleural effusion, not elsewhere classified: Secondary | ICD-10-CM | POA: Diagnosis not present

## 2020-03-08 DIAGNOSIS — Z794 Long term (current) use of insulin: Secondary | ICD-10-CM | POA: Diagnosis not present

## 2020-03-08 DIAGNOSIS — Z8673 Personal history of transient ischemic attack (TIA), and cerebral infarction without residual deficits: Secondary | ICD-10-CM | POA: Diagnosis not present

## 2020-03-08 DIAGNOSIS — N189 Chronic kidney disease, unspecified: Secondary | ICD-10-CM | POA: Diagnosis not present

## 2020-03-08 DIAGNOSIS — Q6239 Other obstructive defects of renal pelvis and ureter: Secondary | ICD-10-CM | POA: Diagnosis not present

## 2020-03-08 DIAGNOSIS — F418 Other specified anxiety disorders: Secondary | ICD-10-CM | POA: Diagnosis not present

## 2020-03-08 DIAGNOSIS — R410 Disorientation, unspecified: Secondary | ICD-10-CM | POA: Diagnosis not present

## 2020-03-08 DIAGNOSIS — R6520 Severe sepsis without septic shock: Secondary | ICD-10-CM | POA: Diagnosis not present

## 2020-03-08 DIAGNOSIS — I679 Cerebrovascular disease, unspecified: Secondary | ICD-10-CM | POA: Diagnosis not present

## 2020-03-08 DIAGNOSIS — R222 Localized swelling, mass and lump, trunk: Secondary | ICD-10-CM | POA: Diagnosis not present

## 2020-03-08 DIAGNOSIS — N052 Unspecified nephritic syndrome with diffuse membranous glomerulonephritis: Secondary | ICD-10-CM | POA: Diagnosis not present

## 2020-03-08 DIAGNOSIS — M4126 Other idiopathic scoliosis, lumbar region: Secondary | ICD-10-CM | POA: Diagnosis not present

## 2020-03-08 DIAGNOSIS — I2609 Other pulmonary embolism with acute cor pulmonale: Secondary | ICD-10-CM | POA: Diagnosis not present

## 2020-03-08 DIAGNOSIS — J310 Chronic rhinitis: Secondary | ICD-10-CM | POA: Diagnosis not present

## 2020-03-08 DIAGNOSIS — M546 Pain in thoracic spine: Secondary | ICD-10-CM | POA: Diagnosis not present

## 2020-03-08 DIAGNOSIS — M19042 Primary osteoarthritis, left hand: Secondary | ICD-10-CM | POA: Diagnosis not present

## 2020-03-08 DIAGNOSIS — E113391 Type 2 diabetes mellitus with moderate nonproliferative diabetic retinopathy without macular edema, right eye: Secondary | ICD-10-CM | POA: Diagnosis not present

## 2020-03-08 DIAGNOSIS — Z72821 Inadequate sleep hygiene: Secondary | ICD-10-CM | POA: Diagnosis not present

## 2020-03-08 DIAGNOSIS — Z6833 Body mass index (BMI) 33.0-33.9, adult: Secondary | ICD-10-CM | POA: Diagnosis not present

## 2020-03-08 DIAGNOSIS — H33321 Round hole, right eye: Secondary | ICD-10-CM | POA: Diagnosis not present

## 2020-03-08 DIAGNOSIS — Z4789 Encounter for other orthopedic aftercare: Secondary | ICD-10-CM | POA: Diagnosis not present

## 2020-03-08 DIAGNOSIS — H1045 Other chronic allergic conjunctivitis: Secondary | ICD-10-CM | POA: Diagnosis not present

## 2020-03-08 DIAGNOSIS — R82998 Other abnormal findings in urine: Secondary | ICD-10-CM | POA: Diagnosis not present

## 2020-03-08 DIAGNOSIS — Z9119 Patient's noncompliance with other medical treatment and regimen: Secondary | ICD-10-CM | POA: Diagnosis not present

## 2020-03-08 DIAGNOSIS — I25709 Atherosclerosis of coronary artery bypass graft(s), unspecified, with unspecified angina pectoris: Secondary | ICD-10-CM | POA: Diagnosis not present

## 2020-03-08 DIAGNOSIS — J3801 Paralysis of vocal cords and larynx, unilateral: Secondary | ICD-10-CM | POA: Diagnosis not present

## 2020-03-08 DIAGNOSIS — R2681 Unsteadiness on feet: Secondary | ICD-10-CM | POA: Diagnosis not present

## 2020-03-08 DIAGNOSIS — I69390 Apraxia following cerebral infarction: Secondary | ICD-10-CM | POA: Diagnosis not present

## 2020-03-08 DIAGNOSIS — I208 Other forms of angina pectoris: Secondary | ICD-10-CM | POA: Diagnosis not present

## 2020-03-08 DIAGNOSIS — S72001A Fracture of unspecified part of neck of right femur, initial encounter for closed fracture: Secondary | ICD-10-CM | POA: Diagnosis not present

## 2020-03-08 DIAGNOSIS — E11311 Type 2 diabetes mellitus with unspecified diabetic retinopathy with macular edema: Secondary | ICD-10-CM | POA: Diagnosis not present

## 2020-03-08 DIAGNOSIS — D696 Thrombocytopenia, unspecified: Secondary | ICD-10-CM | POA: Diagnosis not present

## 2020-03-08 DIAGNOSIS — Z96652 Presence of left artificial knee joint: Secondary | ICD-10-CM | POA: Diagnosis not present

## 2020-03-08 DIAGNOSIS — C44719 Basal cell carcinoma of skin of left lower limb, including hip: Secondary | ICD-10-CM | POA: Diagnosis not present

## 2020-03-08 DIAGNOSIS — E11610 Type 2 diabetes mellitus with diabetic neuropathic arthropathy: Secondary | ICD-10-CM | POA: Diagnosis not present

## 2020-03-08 DIAGNOSIS — Z0000 Encounter for general adult medical examination without abnormal findings: Secondary | ICD-10-CM | POA: Diagnosis not present

## 2020-03-08 DIAGNOSIS — R221 Localized swelling, mass and lump, neck: Secondary | ICD-10-CM | POA: Diagnosis not present

## 2020-03-08 DIAGNOSIS — H25813 Combined forms of age-related cataract, bilateral: Secondary | ICD-10-CM | POA: Diagnosis not present

## 2020-03-08 DIAGNOSIS — S90212A Contusion of left great toe with damage to nail, initial encounter: Secondary | ICD-10-CM | POA: Diagnosis not present

## 2020-03-08 DIAGNOSIS — L738 Other specified follicular disorders: Secondary | ICD-10-CM | POA: Diagnosis not present

## 2020-03-08 DIAGNOSIS — M6281 Muscle weakness (generalized): Secondary | ICD-10-CM | POA: Diagnosis not present

## 2020-03-08 DIAGNOSIS — E1065 Type 1 diabetes mellitus with hyperglycemia: Secondary | ICD-10-CM | POA: Diagnosis not present

## 2020-03-08 DIAGNOSIS — Z6834 Body mass index (BMI) 34.0-34.9, adult: Secondary | ICD-10-CM | POA: Diagnosis not present

## 2020-03-08 DIAGNOSIS — Z8619 Personal history of other infectious and parasitic diseases: Secondary | ICD-10-CM | POA: Diagnosis not present

## 2020-03-08 DIAGNOSIS — N951 Menopausal and female climacteric states: Secondary | ICD-10-CM | POA: Diagnosis not present

## 2020-03-08 DIAGNOSIS — H25041 Posterior subcapsular polar age-related cataract, right eye: Secondary | ICD-10-CM | POA: Diagnosis not present

## 2020-03-08 DIAGNOSIS — H01004 Unspecified blepharitis left upper eyelid: Secondary | ICD-10-CM | POA: Diagnosis not present

## 2020-03-08 DIAGNOSIS — Z86018 Personal history of other benign neoplasm: Secondary | ICD-10-CM | POA: Diagnosis not present

## 2020-03-08 DIAGNOSIS — M1A9XX1 Chronic gout, unspecified, with tophus (tophi): Secondary | ICD-10-CM | POA: Diagnosis not present

## 2020-03-08 DIAGNOSIS — D125 Benign neoplasm of sigmoid colon: Secondary | ICD-10-CM | POA: Diagnosis not present

## 2020-03-08 DIAGNOSIS — M25559 Pain in unspecified hip: Secondary | ICD-10-CM | POA: Diagnosis not present

## 2020-03-08 DIAGNOSIS — Z20822 Contact with and (suspected) exposure to covid-19: Secondary | ICD-10-CM | POA: Diagnosis not present

## 2020-03-08 DIAGNOSIS — N132 Hydronephrosis with renal and ureteral calculous obstruction: Secondary | ICD-10-CM | POA: Diagnosis not present

## 2020-03-08 DIAGNOSIS — H179 Unspecified corneal scar and opacity: Secondary | ICD-10-CM | POA: Diagnosis not present

## 2020-03-08 DIAGNOSIS — I120 Hypertensive chronic kidney disease with stage 5 chronic kidney disease or end stage renal disease: Secondary | ICD-10-CM | POA: Diagnosis not present

## 2020-03-08 DIAGNOSIS — L728 Other follicular cysts of the skin and subcutaneous tissue: Secondary | ICD-10-CM | POA: Diagnosis not present

## 2020-03-08 DIAGNOSIS — E519 Thiamine deficiency, unspecified: Secondary | ICD-10-CM | POA: Diagnosis not present

## 2020-03-08 DIAGNOSIS — R490 Dysphonia: Secondary | ICD-10-CM | POA: Diagnosis not present

## 2020-03-08 DIAGNOSIS — I341 Nonrheumatic mitral (valve) prolapse: Secondary | ICD-10-CM | POA: Diagnosis not present

## 2020-03-08 DIAGNOSIS — C7951 Secondary malignant neoplasm of bone: Secondary | ICD-10-CM | POA: Diagnosis not present

## 2020-03-08 DIAGNOSIS — M19041 Primary osteoarthritis, right hand: Secondary | ICD-10-CM | POA: Diagnosis not present

## 2020-03-08 DIAGNOSIS — G47419 Narcolepsy without cataplexy: Secondary | ICD-10-CM | POA: Diagnosis not present

## 2020-03-08 DIAGNOSIS — Z7409 Other reduced mobility: Secondary | ICD-10-CM | POA: Diagnosis not present

## 2020-03-08 DIAGNOSIS — R1314 Dysphagia, pharyngoesophageal phase: Secondary | ICD-10-CM | POA: Diagnosis not present

## 2020-03-08 DIAGNOSIS — M79671 Pain in right foot: Secondary | ICD-10-CM | POA: Diagnosis not present

## 2020-03-08 DIAGNOSIS — R197 Diarrhea, unspecified: Secondary | ICD-10-CM | POA: Diagnosis not present

## 2020-03-08 DIAGNOSIS — I272 Pulmonary hypertension, unspecified: Secondary | ICD-10-CM | POA: Diagnosis not present

## 2020-03-08 DIAGNOSIS — M659 Synovitis and tenosynovitis, unspecified: Secondary | ICD-10-CM | POA: Diagnosis not present

## 2020-03-08 DIAGNOSIS — W57XXXA Bitten or stung by nonvenomous insect and other nonvenomous arthropods, initial encounter: Secondary | ICD-10-CM | POA: Diagnosis not present

## 2020-03-08 DIAGNOSIS — K5903 Drug induced constipation: Secondary | ICD-10-CM | POA: Diagnosis not present

## 2020-03-08 DIAGNOSIS — M791 Myalgia, unspecified site: Secondary | ICD-10-CM | POA: Diagnosis not present

## 2020-03-08 DIAGNOSIS — M50222 Other cervical disc displacement at C5-C6 level: Secondary | ICD-10-CM | POA: Diagnosis not present

## 2020-03-08 DIAGNOSIS — C50912 Malignant neoplasm of unspecified site of left female breast: Secondary | ICD-10-CM | POA: Diagnosis not present

## 2020-03-08 DIAGNOSIS — H02729 Madarosis of unspecified eye, unspecified eyelid and periocular area: Secondary | ICD-10-CM | POA: Diagnosis not present

## 2020-03-08 DIAGNOSIS — F330 Major depressive disorder, recurrent, mild: Secondary | ICD-10-CM | POA: Diagnosis not present

## 2020-03-08 DIAGNOSIS — R2689 Other abnormalities of gait and mobility: Secondary | ICD-10-CM | POA: Diagnosis not present

## 2020-03-08 DIAGNOSIS — I493 Ventricular premature depolarization: Secondary | ICD-10-CM | POA: Diagnosis not present

## 2020-03-08 DIAGNOSIS — M720 Palmar fascial fibromatosis [Dupuytren]: Secondary | ICD-10-CM | POA: Diagnosis not present

## 2020-03-08 DIAGNOSIS — L89152 Pressure ulcer of sacral region, stage 2: Secondary | ICD-10-CM | POA: Diagnosis not present

## 2020-03-08 DIAGNOSIS — R002 Palpitations: Secondary | ICD-10-CM | POA: Diagnosis not present

## 2020-03-08 DIAGNOSIS — J1282 Pneumonia due to coronavirus disease 2019: Secondary | ICD-10-CM | POA: Diagnosis not present

## 2020-03-08 DIAGNOSIS — I441 Atrioventricular block, second degree: Secondary | ICD-10-CM | POA: Diagnosis not present

## 2020-03-08 DIAGNOSIS — B353 Tinea pedis: Secondary | ICD-10-CM | POA: Diagnosis not present

## 2020-03-08 DIAGNOSIS — Z9861 Coronary angioplasty status: Secondary | ICD-10-CM | POA: Diagnosis not present

## 2020-03-08 DIAGNOSIS — C9210 Chronic myeloid leukemia, BCR/ABL-positive, not having achieved remission: Secondary | ICD-10-CM | POA: Diagnosis not present

## 2020-03-08 DIAGNOSIS — I132 Hypertensive heart and chronic kidney disease with heart failure and with stage 5 chronic kidney disease, or end stage renal disease: Secondary | ICD-10-CM | POA: Diagnosis not present

## 2020-03-08 DIAGNOSIS — M2042 Other hammer toe(s) (acquired), left foot: Secondary | ICD-10-CM | POA: Diagnosis not present

## 2020-03-08 DIAGNOSIS — Z1389 Encounter for screening for other disorder: Secondary | ICD-10-CM | POA: Diagnosis not present

## 2020-03-08 DIAGNOSIS — E1129 Type 2 diabetes mellitus with other diabetic kidney complication: Secondary | ICD-10-CM | POA: Diagnosis not present

## 2020-03-08 DIAGNOSIS — Z86711 Personal history of pulmonary embolism: Secondary | ICD-10-CM | POA: Diagnosis not present

## 2020-03-08 DIAGNOSIS — Z01818 Encounter for other preprocedural examination: Secondary | ICD-10-CM | POA: Diagnosis not present

## 2020-03-08 DIAGNOSIS — Z634 Disappearance and death of family member: Secondary | ICD-10-CM | POA: Diagnosis not present

## 2020-03-08 DIAGNOSIS — M6208 Separation of muscle (nontraumatic), other site: Secondary | ICD-10-CM | POA: Diagnosis not present

## 2020-03-08 DIAGNOSIS — Z09 Encounter for follow-up examination after completed treatment for conditions other than malignant neoplasm: Secondary | ICD-10-CM | POA: Diagnosis not present

## 2020-03-08 DIAGNOSIS — I5189 Other ill-defined heart diseases: Secondary | ICD-10-CM | POA: Diagnosis not present

## 2020-03-08 DIAGNOSIS — F17210 Nicotine dependence, cigarettes, uncomplicated: Secondary | ICD-10-CM | POA: Diagnosis not present

## 2020-03-08 DIAGNOSIS — I25119 Atherosclerotic heart disease of native coronary artery with unspecified angina pectoris: Secondary | ICD-10-CM | POA: Diagnosis not present

## 2020-03-08 DIAGNOSIS — H6690 Otitis media, unspecified, unspecified ear: Secondary | ICD-10-CM | POA: Diagnosis not present

## 2020-03-08 DIAGNOSIS — M130 Polyarthritis, unspecified: Secondary | ICD-10-CM | POA: Diagnosis not present

## 2020-03-08 DIAGNOSIS — M79604 Pain in right leg: Secondary | ICD-10-CM | POA: Diagnosis not present

## 2020-03-08 DIAGNOSIS — R102 Pelvic and perineal pain: Secondary | ICD-10-CM | POA: Diagnosis not present

## 2020-03-08 DIAGNOSIS — I8393 Asymptomatic varicose veins of bilateral lower extremities: Secondary | ICD-10-CM | POA: Diagnosis not present

## 2020-03-08 DIAGNOSIS — Z6821 Body mass index (BMI) 21.0-21.9, adult: Secondary | ICD-10-CM | POA: Diagnosis not present

## 2020-03-08 DIAGNOSIS — I447 Left bundle-branch block, unspecified: Secondary | ICD-10-CM | POA: Diagnosis not present

## 2020-03-08 DIAGNOSIS — N3941 Urge incontinence: Secondary | ICD-10-CM | POA: Diagnosis not present

## 2020-03-08 DIAGNOSIS — E211 Secondary hyperparathyroidism, not elsewhere classified: Secondary | ICD-10-CM | POA: Diagnosis not present

## 2020-03-08 DIAGNOSIS — C3411 Malignant neoplasm of upper lobe, right bronchus or lung: Secondary | ICD-10-CM | POA: Diagnosis not present

## 2020-03-08 DIAGNOSIS — I150 Renovascular hypertension: Secondary | ICD-10-CM | POA: Diagnosis not present

## 2020-03-08 DIAGNOSIS — L84 Corns and callosities: Secondary | ICD-10-CM | POA: Diagnosis not present

## 2020-03-08 DIAGNOSIS — Z720 Tobacco use: Secondary | ICD-10-CM | POA: Diagnosis not present

## 2020-03-08 DIAGNOSIS — F4321 Adjustment disorder with depressed mood: Secondary | ICD-10-CM | POA: Diagnosis not present

## 2020-03-08 DIAGNOSIS — N410 Acute prostatitis: Secondary | ICD-10-CM | POA: Diagnosis not present

## 2020-03-08 DIAGNOSIS — R55 Syncope and collapse: Secondary | ICD-10-CM | POA: Diagnosis not present

## 2020-03-08 DIAGNOSIS — I6523 Occlusion and stenosis of bilateral carotid arteries: Secondary | ICD-10-CM | POA: Diagnosis not present

## 2020-03-08 DIAGNOSIS — N6489 Other specified disorders of breast: Secondary | ICD-10-CM | POA: Diagnosis not present

## 2020-03-08 DIAGNOSIS — M1711 Unilateral primary osteoarthritis, right knee: Secondary | ICD-10-CM | POA: Diagnosis not present

## 2020-03-08 DIAGNOSIS — M5441 Lumbago with sciatica, right side: Secondary | ICD-10-CM | POA: Diagnosis not present

## 2020-03-08 DIAGNOSIS — D631 Anemia in chronic kidney disease: Secondary | ICD-10-CM | POA: Diagnosis not present

## 2020-03-08 DIAGNOSIS — H52203 Unspecified astigmatism, bilateral: Secondary | ICD-10-CM | POA: Diagnosis not present

## 2020-03-08 DIAGNOSIS — M25552 Pain in left hip: Secondary | ICD-10-CM | POA: Diagnosis not present

## 2020-03-08 DIAGNOSIS — R7302 Impaired glucose tolerance (oral): Secondary | ICD-10-CM | POA: Diagnosis not present

## 2020-03-08 DIAGNOSIS — F410 Panic disorder [episodic paroxysmal anxiety] without agoraphobia: Secondary | ICD-10-CM | POA: Diagnosis not present

## 2020-03-08 DIAGNOSIS — M353 Polymyalgia rheumatica: Secondary | ICD-10-CM | POA: Diagnosis not present

## 2020-03-08 DIAGNOSIS — N1831 Chronic kidney disease, stage 3a: Secondary | ICD-10-CM | POA: Diagnosis not present

## 2020-03-08 DIAGNOSIS — C50812 Malignant neoplasm of overlapping sites of left female breast: Secondary | ICD-10-CM | POA: Diagnosis not present

## 2020-03-08 DIAGNOSIS — H04123 Dry eye syndrome of bilateral lacrimal glands: Secondary | ICD-10-CM | POA: Diagnosis not present

## 2020-03-08 DIAGNOSIS — H43392 Other vitreous opacities, left eye: Secondary | ICD-10-CM | POA: Diagnosis not present

## 2020-03-08 DIAGNOSIS — I739 Peripheral vascular disease, unspecified: Secondary | ICD-10-CM | POA: Diagnosis not present

## 2020-03-08 DIAGNOSIS — R319 Hematuria, unspecified: Secondary | ICD-10-CM | POA: Diagnosis not present

## 2020-03-08 DIAGNOSIS — C801 Malignant (primary) neoplasm, unspecified: Secondary | ICD-10-CM | POA: Diagnosis not present

## 2020-03-08 DIAGNOSIS — M5413 Radiculopathy, cervicothoracic region: Secondary | ICD-10-CM | POA: Diagnosis not present

## 2020-03-08 DIAGNOSIS — G40901 Epilepsy, unspecified, not intractable, with status epilepticus: Secondary | ICD-10-CM | POA: Diagnosis not present

## 2020-03-08 DIAGNOSIS — I10 Essential (primary) hypertension: Secondary | ICD-10-CM | POA: Diagnosis not present

## 2020-03-08 DIAGNOSIS — E440 Moderate protein-calorie malnutrition: Secondary | ICD-10-CM | POA: Diagnosis not present

## 2020-03-08 DIAGNOSIS — E878 Other disorders of electrolyte and fluid balance, not elsewhere classified: Secondary | ICD-10-CM | POA: Diagnosis not present

## 2020-03-08 DIAGNOSIS — H25012 Cortical age-related cataract, left eye: Secondary | ICD-10-CM | POA: Diagnosis not present

## 2020-03-08 DIAGNOSIS — N2581 Secondary hyperparathyroidism of renal origin: Secondary | ICD-10-CM | POA: Diagnosis not present

## 2020-03-08 DIAGNOSIS — E0590 Thyrotoxicosis, unspecified without thyrotoxic crisis or storm: Secondary | ICD-10-CM | POA: Diagnosis not present

## 2020-03-08 DIAGNOSIS — I788 Other diseases of capillaries: Secondary | ICD-10-CM | POA: Diagnosis not present

## 2020-03-08 DIAGNOSIS — M25512 Pain in left shoulder: Secondary | ICD-10-CM | POA: Diagnosis not present

## 2020-03-08 DIAGNOSIS — Z96612 Presence of left artificial shoulder joint: Secondary | ICD-10-CM | POA: Diagnosis not present

## 2020-03-08 DIAGNOSIS — J84112 Idiopathic pulmonary fibrosis: Secondary | ICD-10-CM | POA: Diagnosis not present

## 2020-03-08 DIAGNOSIS — T451X5A Adverse effect of antineoplastic and immunosuppressive drugs, initial encounter: Secondary | ICD-10-CM | POA: Diagnosis not present

## 2020-03-08 DIAGNOSIS — M7501 Adhesive capsulitis of right shoulder: Secondary | ICD-10-CM | POA: Diagnosis not present

## 2020-03-08 DIAGNOSIS — Z66 Do not resuscitate: Secondary | ICD-10-CM | POA: Diagnosis not present

## 2020-03-08 DIAGNOSIS — I952 Hypotension due to drugs: Secondary | ICD-10-CM | POA: Diagnosis not present

## 2020-03-08 DIAGNOSIS — D126 Benign neoplasm of colon, unspecified: Secondary | ICD-10-CM | POA: Diagnosis not present

## 2020-03-08 DIAGNOSIS — F319 Bipolar disorder, unspecified: Secondary | ICD-10-CM | POA: Diagnosis not present

## 2020-03-08 DIAGNOSIS — H43813 Vitreous degeneration, bilateral: Secondary | ICD-10-CM | POA: Diagnosis not present

## 2020-03-08 DIAGNOSIS — R0683 Snoring: Secondary | ICD-10-CM | POA: Diagnosis not present

## 2020-03-08 DIAGNOSIS — L299 Pruritus, unspecified: Secondary | ICD-10-CM | POA: Diagnosis not present

## 2020-03-08 DIAGNOSIS — F3181 Bipolar II disorder: Secondary | ICD-10-CM | POA: Diagnosis not present

## 2020-03-08 DIAGNOSIS — C44712 Basal cell carcinoma of skin of right lower limb, including hip: Secondary | ICD-10-CM | POA: Diagnosis not present

## 2020-03-08 DIAGNOSIS — J340 Abscess, furuncle and carbuncle of nose: Secondary | ICD-10-CM | POA: Diagnosis not present

## 2020-03-08 DIAGNOSIS — R3911 Hesitancy of micturition: Secondary | ICD-10-CM | POA: Diagnosis not present

## 2020-03-08 DIAGNOSIS — I781 Nevus, non-neoplastic: Secondary | ICD-10-CM | POA: Diagnosis not present

## 2020-03-08 DIAGNOSIS — X32XXXA Exposure to sunlight, initial encounter: Secondary | ICD-10-CM | POA: Diagnosis not present

## 2020-03-08 DIAGNOSIS — Z0001 Encounter for general adult medical examination with abnormal findings: Secondary | ICD-10-CM | POA: Diagnosis not present

## 2020-03-08 DIAGNOSIS — R208 Other disturbances of skin sensation: Secondary | ICD-10-CM | POA: Diagnosis not present

## 2020-03-08 DIAGNOSIS — C73 Malignant neoplasm of thyroid gland: Secondary | ICD-10-CM | POA: Diagnosis not present

## 2020-03-08 DIAGNOSIS — Z6830 Body mass index (BMI) 30.0-30.9, adult: Secondary | ICD-10-CM | POA: Diagnosis not present

## 2020-03-08 DIAGNOSIS — R928 Other abnormal and inconclusive findings on diagnostic imaging of breast: Secondary | ICD-10-CM | POA: Diagnosis not present

## 2020-03-08 DIAGNOSIS — F3340 Major depressive disorder, recurrent, in remission, unspecified: Secondary | ICD-10-CM | POA: Diagnosis not present

## 2020-03-08 DIAGNOSIS — Z7902 Long term (current) use of antithrombotics/antiplatelets: Secondary | ICD-10-CM | POA: Diagnosis not present

## 2020-03-08 DIAGNOSIS — I255 Ischemic cardiomyopathy: Secondary | ICD-10-CM | POA: Diagnosis not present

## 2020-03-08 DIAGNOSIS — Z6836 Body mass index (BMI) 36.0-36.9, adult: Secondary | ICD-10-CM | POA: Diagnosis not present

## 2020-03-08 DIAGNOSIS — Z6822 Body mass index (BMI) 22.0-22.9, adult: Secondary | ICD-10-CM | POA: Diagnosis not present

## 2020-03-08 DIAGNOSIS — Z8042 Family history of malignant neoplasm of prostate: Secondary | ICD-10-CM | POA: Diagnosis not present

## 2020-03-08 DIAGNOSIS — I6381 Other cerebral infarction due to occlusion or stenosis of small artery: Secondary | ICD-10-CM | POA: Diagnosis not present

## 2020-03-08 DIAGNOSIS — G9341 Metabolic encephalopathy: Secondary | ICD-10-CM | POA: Diagnosis not present

## 2020-03-08 DIAGNOSIS — I5022 Chronic systolic (congestive) heart failure: Secondary | ICD-10-CM | POA: Diagnosis not present

## 2020-03-08 DIAGNOSIS — N23 Unspecified renal colic: Secondary | ICD-10-CM | POA: Diagnosis not present

## 2020-03-08 DIAGNOSIS — D689 Coagulation defect, unspecified: Secondary | ICD-10-CM | POA: Diagnosis not present

## 2020-03-08 DIAGNOSIS — N3020 Other chronic cystitis without hematuria: Secondary | ICD-10-CM | POA: Diagnosis not present

## 2020-03-08 DIAGNOSIS — D5919 Other autoimmune hemolytic anemia: Secondary | ICD-10-CM | POA: Diagnosis not present

## 2020-03-08 DIAGNOSIS — R300 Dysuria: Secondary | ICD-10-CM | POA: Diagnosis not present

## 2020-03-08 DIAGNOSIS — K2970 Gastritis, unspecified, without bleeding: Secondary | ICD-10-CM | POA: Diagnosis not present

## 2020-03-08 DIAGNOSIS — I83013 Varicose veins of right lower extremity with ulcer of ankle: Secondary | ICD-10-CM | POA: Diagnosis not present

## 2020-03-08 DIAGNOSIS — M069 Rheumatoid arthritis, unspecified: Secondary | ICD-10-CM | POA: Diagnosis not present

## 2020-03-08 DIAGNOSIS — I314 Cardiac tamponade: Secondary | ICD-10-CM | POA: Diagnosis not present

## 2020-03-08 DIAGNOSIS — M5022 Other cervical disc displacement, mid-cervical region, unspecified level: Secondary | ICD-10-CM | POA: Diagnosis not present

## 2020-03-08 DIAGNOSIS — M26609 Unspecified temporomandibular joint disorder, unspecified side: Secondary | ICD-10-CM | POA: Diagnosis not present

## 2020-03-08 DIAGNOSIS — M1000 Idiopathic gout, unspecified site: Secondary | ICD-10-CM | POA: Diagnosis not present

## 2020-03-08 DIAGNOSIS — Z8739 Personal history of other diseases of the musculoskeletal system and connective tissue: Secondary | ICD-10-CM | POA: Diagnosis not present

## 2020-03-08 DIAGNOSIS — E0500 Thyrotoxicosis with diffuse goiter without thyrotoxic crisis or storm: Secondary | ICD-10-CM | POA: Diagnosis not present

## 2020-03-08 DIAGNOSIS — I2510 Atherosclerotic heart disease of native coronary artery without angina pectoris: Secondary | ICD-10-CM | POA: Diagnosis not present

## 2020-03-08 DIAGNOSIS — Z87442 Personal history of urinary calculi: Secondary | ICD-10-CM | POA: Diagnosis not present

## 2020-03-08 DIAGNOSIS — U071 COVID-19: Secondary | ICD-10-CM | POA: Diagnosis not present

## 2020-03-08 DIAGNOSIS — J432 Centrilobular emphysema: Secondary | ICD-10-CM | POA: Diagnosis not present

## 2020-03-08 DIAGNOSIS — M316 Other giant cell arteritis: Secondary | ICD-10-CM | POA: Diagnosis not present

## 2020-03-08 DIAGNOSIS — Z886 Allergy status to analgesic agent status: Secondary | ICD-10-CM | POA: Diagnosis not present

## 2020-03-08 DIAGNOSIS — I519 Heart disease, unspecified: Secondary | ICD-10-CM | POA: Diagnosis not present

## 2020-03-08 DIAGNOSIS — Z01810 Encounter for preprocedural cardiovascular examination: Secondary | ICD-10-CM | POA: Diagnosis not present

## 2020-03-08 DIAGNOSIS — Z800 Family history of malignant neoplasm of digestive organs: Secondary | ICD-10-CM | POA: Diagnosis not present

## 2020-03-08 DIAGNOSIS — Z95810 Presence of automatic (implantable) cardiac defibrillator: Secondary | ICD-10-CM | POA: Diagnosis not present

## 2020-03-08 DIAGNOSIS — G309 Alzheimer's disease, unspecified: Secondary | ICD-10-CM | POA: Diagnosis not present

## 2020-03-08 DIAGNOSIS — M62512 Muscle wasting and atrophy, not elsewhere classified, left shoulder: Secondary | ICD-10-CM | POA: Diagnosis not present

## 2020-03-08 DIAGNOSIS — C50411 Malignant neoplasm of upper-outer quadrant of right female breast: Secondary | ICD-10-CM | POA: Diagnosis not present

## 2020-03-08 DIAGNOSIS — E538 Deficiency of other specified B group vitamins: Secondary | ICD-10-CM | POA: Diagnosis not present

## 2020-03-08 DIAGNOSIS — N3281 Overactive bladder: Secondary | ICD-10-CM | POA: Diagnosis not present

## 2020-03-08 DIAGNOSIS — I2699 Other pulmonary embolism without acute cor pulmonale: Secondary | ICD-10-CM | POA: Diagnosis not present

## 2020-03-08 DIAGNOSIS — G43709 Chronic migraine without aura, not intractable, without status migrainosus: Secondary | ICD-10-CM | POA: Diagnosis not present

## 2020-03-08 DIAGNOSIS — R310 Gross hematuria: Secondary | ICD-10-CM | POA: Diagnosis not present

## 2020-03-08 DIAGNOSIS — K644 Residual hemorrhoidal skin tags: Secondary | ICD-10-CM | POA: Diagnosis not present

## 2020-03-08 DIAGNOSIS — S62356A Nondisplaced fracture of shaft of fifth metacarpal bone, right hand, initial encounter for closed fracture: Secondary | ICD-10-CM | POA: Diagnosis not present

## 2020-03-08 DIAGNOSIS — M47817 Spondylosis without myelopathy or radiculopathy, lumbosacral region: Secondary | ICD-10-CM | POA: Diagnosis not present

## 2020-03-08 DIAGNOSIS — M0500 Felty's syndrome, unspecified site: Secondary | ICD-10-CM | POA: Diagnosis not present

## 2020-03-08 DIAGNOSIS — D6481 Anemia due to antineoplastic chemotherapy: Secondary | ICD-10-CM | POA: Diagnosis not present

## 2020-03-08 DIAGNOSIS — Z1159 Encounter for screening for other viral diseases: Secondary | ICD-10-CM | POA: Diagnosis not present

## 2020-03-08 DIAGNOSIS — R3129 Other microscopic hematuria: Secondary | ICD-10-CM | POA: Diagnosis not present

## 2020-03-08 DIAGNOSIS — R195 Other fecal abnormalities: Secondary | ICD-10-CM | POA: Diagnosis not present

## 2020-03-08 DIAGNOSIS — I951 Orthostatic hypotension: Secondary | ICD-10-CM | POA: Diagnosis not present

## 2020-03-08 DIAGNOSIS — H02401 Unspecified ptosis of right eyelid: Secondary | ICD-10-CM | POA: Diagnosis not present

## 2020-03-08 DIAGNOSIS — H8113 Benign paroxysmal vertigo, bilateral: Secondary | ICD-10-CM | POA: Diagnosis not present

## 2020-03-08 DIAGNOSIS — L97322 Non-pressure chronic ulcer of left ankle with fat layer exposed: Secondary | ICD-10-CM | POA: Diagnosis not present

## 2020-03-08 DIAGNOSIS — K581 Irritable bowel syndrome with constipation: Secondary | ICD-10-CM | POA: Diagnosis not present

## 2020-03-08 DIAGNOSIS — M5442 Lumbago with sciatica, left side: Secondary | ICD-10-CM | POA: Diagnosis not present

## 2020-03-08 DIAGNOSIS — L2089 Other atopic dermatitis: Secondary | ICD-10-CM | POA: Diagnosis not present

## 2020-03-08 DIAGNOSIS — S22050A Wedge compression fracture of T5-T6 vertebra, initial encounter for closed fracture: Secondary | ICD-10-CM | POA: Diagnosis not present

## 2020-03-08 DIAGNOSIS — M25612 Stiffness of left shoulder, not elsewhere classified: Secondary | ICD-10-CM | POA: Diagnosis not present

## 2020-03-08 DIAGNOSIS — L409 Psoriasis, unspecified: Secondary | ICD-10-CM | POA: Diagnosis not present

## 2020-03-08 DIAGNOSIS — H9201 Otalgia, right ear: Secondary | ICD-10-CM | POA: Diagnosis not present

## 2020-03-08 DIAGNOSIS — G4700 Insomnia, unspecified: Secondary | ICD-10-CM | POA: Diagnosis not present

## 2020-03-08 DIAGNOSIS — S60419S Abrasion of unspecified finger, sequela: Secondary | ICD-10-CM | POA: Diagnosis not present

## 2020-03-08 DIAGNOSIS — F0150 Vascular dementia without behavioral disturbance: Secondary | ICD-10-CM | POA: Diagnosis not present

## 2020-03-08 DIAGNOSIS — M8000XD Age-related osteoporosis with current pathological fracture, unspecified site, subsequent encounter for fracture with routine healing: Secondary | ICD-10-CM | POA: Diagnosis not present

## 2020-03-08 DIAGNOSIS — C760 Malignant neoplasm of head, face and neck: Secondary | ICD-10-CM | POA: Diagnosis not present

## 2020-03-08 DIAGNOSIS — C880 Waldenstrom macroglobulinemia: Secondary | ICD-10-CM | POA: Diagnosis not present

## 2020-03-08 DIAGNOSIS — D179 Benign lipomatous neoplasm, unspecified: Secondary | ICD-10-CM | POA: Diagnosis not present

## 2020-03-08 DIAGNOSIS — R530 Neoplastic (malignant) related fatigue: Secondary | ICD-10-CM | POA: Diagnosis not present

## 2020-03-08 DIAGNOSIS — L308 Other specified dermatitis: Secondary | ICD-10-CM | POA: Diagnosis not present

## 2020-03-08 DIAGNOSIS — Z89611 Acquired absence of right leg above knee: Secondary | ICD-10-CM | POA: Diagnosis not present

## 2020-03-08 DIAGNOSIS — R4781 Slurred speech: Secondary | ICD-10-CM | POA: Diagnosis not present

## 2020-03-08 DIAGNOSIS — M0579 Rheumatoid arthritis with rheumatoid factor of multiple sites without organ or systems involvement: Secondary | ICD-10-CM | POA: Diagnosis not present

## 2020-03-08 DIAGNOSIS — R52 Pain, unspecified: Secondary | ICD-10-CM | POA: Diagnosis not present

## 2020-03-08 DIAGNOSIS — R634 Abnormal weight loss: Secondary | ICD-10-CM | POA: Diagnosis not present

## 2020-03-08 DIAGNOSIS — M25541 Pain in joints of right hand: Secondary | ICD-10-CM | POA: Diagnosis not present

## 2020-03-08 DIAGNOSIS — R627 Adult failure to thrive: Secondary | ICD-10-CM | POA: Diagnosis not present

## 2020-03-08 DIAGNOSIS — L97521 Non-pressure chronic ulcer of other part of left foot limited to breakdown of skin: Secondary | ICD-10-CM | POA: Diagnosis not present

## 2020-03-08 DIAGNOSIS — M4722 Other spondylosis with radiculopathy, cervical region: Secondary | ICD-10-CM | POA: Diagnosis not present

## 2020-03-08 DIAGNOSIS — C50919 Malignant neoplasm of unspecified site of unspecified female breast: Secondary | ICD-10-CM | POA: Diagnosis not present

## 2020-03-08 DIAGNOSIS — M25561 Pain in right knee: Secondary | ICD-10-CM | POA: Diagnosis not present

## 2020-03-08 DIAGNOSIS — H0102B Squamous blepharitis left eye, upper and lower eyelids: Secondary | ICD-10-CM | POA: Diagnosis not present

## 2020-03-08 DIAGNOSIS — M47897 Other spondylosis, lumbosacral region: Secondary | ICD-10-CM | POA: Diagnosis not present

## 2020-03-08 DIAGNOSIS — K5730 Diverticulosis of large intestine without perforation or abscess without bleeding: Secondary | ICD-10-CM | POA: Diagnosis not present

## 2020-03-08 DIAGNOSIS — C186 Malignant neoplasm of descending colon: Secondary | ICD-10-CM | POA: Diagnosis not present

## 2020-03-08 DIAGNOSIS — J069 Acute upper respiratory infection, unspecified: Secondary | ICD-10-CM | POA: Diagnosis not present

## 2020-03-08 DIAGNOSIS — H34811 Central retinal vein occlusion, right eye, with macular edema: Secondary | ICD-10-CM | POA: Diagnosis not present

## 2020-03-08 DIAGNOSIS — Z9189 Other specified personal risk factors, not elsewhere classified: Secondary | ICD-10-CM | POA: Diagnosis not present

## 2020-03-08 DIAGNOSIS — E113311 Type 2 diabetes mellitus with moderate nonproliferative diabetic retinopathy with macular edema, right eye: Secondary | ICD-10-CM | POA: Diagnosis not present

## 2020-03-08 DIAGNOSIS — M542 Cervicalgia: Secondary | ICD-10-CM | POA: Diagnosis not present

## 2020-03-08 DIAGNOSIS — E663 Overweight: Secondary | ICD-10-CM | POA: Diagnosis not present

## 2020-03-08 DIAGNOSIS — M899 Disorder of bone, unspecified: Secondary | ICD-10-CM | POA: Diagnosis not present

## 2020-03-08 DIAGNOSIS — K529 Noninfective gastroenteritis and colitis, unspecified: Secondary | ICD-10-CM | POA: Diagnosis not present

## 2020-03-08 DIAGNOSIS — R739 Hyperglycemia, unspecified: Secondary | ICD-10-CM | POA: Diagnosis not present

## 2020-03-08 DIAGNOSIS — M7130 Other bursal cyst, unspecified site: Secondary | ICD-10-CM | POA: Diagnosis not present

## 2020-03-08 DIAGNOSIS — J849 Interstitial pulmonary disease, unspecified: Secondary | ICD-10-CM | POA: Diagnosis not present

## 2020-03-08 DIAGNOSIS — Z170 Estrogen receptor positive status [ER+]: Secondary | ICD-10-CM | POA: Diagnosis not present

## 2020-03-08 DIAGNOSIS — H43811 Vitreous degeneration, right eye: Secondary | ICD-10-CM | POA: Diagnosis not present

## 2020-03-08 DIAGNOSIS — N1330 Unspecified hydronephrosis: Secondary | ICD-10-CM | POA: Diagnosis not present

## 2020-03-08 DIAGNOSIS — E6609 Other obesity due to excess calories: Secondary | ICD-10-CM | POA: Diagnosis not present

## 2020-03-08 DIAGNOSIS — E786 Lipoprotein deficiency: Secondary | ICD-10-CM | POA: Diagnosis not present

## 2020-03-08 DIAGNOSIS — I4819 Other persistent atrial fibrillation: Secondary | ICD-10-CM | POA: Diagnosis not present

## 2020-03-08 DIAGNOSIS — E041 Nontoxic single thyroid nodule: Secondary | ICD-10-CM | POA: Diagnosis not present

## 2020-03-08 DIAGNOSIS — R700 Elevated erythrocyte sedimentation rate: Secondary | ICD-10-CM | POA: Diagnosis not present

## 2020-03-08 DIAGNOSIS — I130 Hypertensive heart and chronic kidney disease with heart failure and stage 1 through stage 4 chronic kidney disease, or unspecified chronic kidney disease: Secondary | ICD-10-CM | POA: Diagnosis not present

## 2020-03-08 DIAGNOSIS — R112 Nausea with vomiting, unspecified: Secondary | ICD-10-CM | POA: Diagnosis not present

## 2020-03-08 DIAGNOSIS — N401 Enlarged prostate with lower urinary tract symptoms: Secondary | ICD-10-CM | POA: Diagnosis not present

## 2020-03-08 DIAGNOSIS — H401112 Primary open-angle glaucoma, right eye, moderate stage: Secondary | ICD-10-CM | POA: Diagnosis not present

## 2020-03-08 DIAGNOSIS — T50B95A Adverse effect of other viral vaccines, initial encounter: Secondary | ICD-10-CM | POA: Diagnosis not present

## 2020-03-08 DIAGNOSIS — L659 Nonscarring hair loss, unspecified: Secondary | ICD-10-CM | POA: Diagnosis not present

## 2020-03-08 DIAGNOSIS — S22030A Wedge compression fracture of third thoracic vertebra, initial encounter for closed fracture: Secondary | ICD-10-CM | POA: Diagnosis not present

## 2020-03-08 DIAGNOSIS — I6782 Cerebral ischemia: Secondary | ICD-10-CM | POA: Diagnosis not present

## 2020-03-08 DIAGNOSIS — J4550 Severe persistent asthma, uncomplicated: Secondary | ICD-10-CM | POA: Diagnosis not present

## 2020-03-08 DIAGNOSIS — G47411 Narcolepsy with cataplexy: Secondary | ICD-10-CM | POA: Diagnosis not present

## 2020-03-08 DIAGNOSIS — M4184 Other forms of scoliosis, thoracic region: Secondary | ICD-10-CM | POA: Diagnosis not present

## 2020-03-08 DIAGNOSIS — D492 Neoplasm of unspecified behavior of bone, soft tissue, and skin: Secondary | ICD-10-CM | POA: Diagnosis not present

## 2020-03-08 DIAGNOSIS — J302 Other seasonal allergic rhinitis: Secondary | ICD-10-CM | POA: Diagnosis not present

## 2020-03-08 DIAGNOSIS — H401134 Primary open-angle glaucoma, bilateral, indeterminate stage: Secondary | ICD-10-CM | POA: Diagnosis not present

## 2020-03-08 DIAGNOSIS — I209 Angina pectoris, unspecified: Secondary | ICD-10-CM | POA: Diagnosis not present

## 2020-03-08 DIAGNOSIS — M25569 Pain in unspecified knee: Secondary | ICD-10-CM | POA: Diagnosis not present

## 2020-03-08 DIAGNOSIS — K5190 Ulcerative colitis, unspecified, without complications: Secondary | ICD-10-CM | POA: Diagnosis not present

## 2020-03-08 DIAGNOSIS — S60459A Superficial foreign body of unspecified finger, initial encounter: Secondary | ICD-10-CM | POA: Diagnosis not present

## 2020-03-08 DIAGNOSIS — Z8709 Personal history of other diseases of the respiratory system: Secondary | ICD-10-CM | POA: Diagnosis not present

## 2020-03-08 DIAGNOSIS — M5412 Radiculopathy, cervical region: Secondary | ICD-10-CM | POA: Diagnosis not present

## 2020-03-08 DIAGNOSIS — N393 Stress incontinence (female) (male): Secondary | ICD-10-CM | POA: Diagnosis not present

## 2020-03-08 DIAGNOSIS — D6869 Other thrombophilia: Secondary | ICD-10-CM | POA: Diagnosis not present

## 2020-03-08 DIAGNOSIS — F902 Attention-deficit hyperactivity disorder, combined type: Secondary | ICD-10-CM | POA: Diagnosis not present

## 2020-03-08 DIAGNOSIS — I83812 Varicose veins of left lower extremities with pain: Secondary | ICD-10-CM | POA: Diagnosis not present

## 2020-03-08 DIAGNOSIS — I442 Atrioventricular block, complete: Secondary | ICD-10-CM | POA: Diagnosis not present

## 2020-03-08 DIAGNOSIS — G8110 Spastic hemiplegia affecting unspecified side: Secondary | ICD-10-CM | POA: Diagnosis not present

## 2020-03-08 DIAGNOSIS — C8310 Mantle cell lymphoma, unspecified site: Secondary | ICD-10-CM | POA: Diagnosis not present

## 2020-03-08 DIAGNOSIS — M7122 Synovial cyst of popliteal space [Baker], left knee: Secondary | ICD-10-CM | POA: Diagnosis not present

## 2020-03-08 DIAGNOSIS — K3189 Other diseases of stomach and duodenum: Secondary | ICD-10-CM | POA: Diagnosis not present

## 2020-03-08 DIAGNOSIS — Z136 Encounter for screening for cardiovascular disorders: Secondary | ICD-10-CM | POA: Diagnosis not present

## 2020-03-08 DIAGNOSIS — I639 Cerebral infarction, unspecified: Secondary | ICD-10-CM | POA: Diagnosis not present

## 2020-03-08 DIAGNOSIS — K625 Hemorrhage of anus and rectum: Secondary | ICD-10-CM | POA: Diagnosis not present

## 2020-03-08 DIAGNOSIS — C8590 Non-Hodgkin lymphoma, unspecified, unspecified site: Secondary | ICD-10-CM | POA: Diagnosis not present

## 2020-03-08 DIAGNOSIS — I509 Heart failure, unspecified: Secondary | ICD-10-CM | POA: Diagnosis not present

## 2020-03-08 DIAGNOSIS — H0100B Unspecified blepharitis left eye, upper and lower eyelids: Secondary | ICD-10-CM | POA: Diagnosis not present

## 2020-03-08 DIAGNOSIS — F1010 Alcohol abuse, uncomplicated: Secondary | ICD-10-CM | POA: Diagnosis not present

## 2020-03-08 DIAGNOSIS — M48061 Spinal stenosis, lumbar region without neurogenic claudication: Secondary | ICD-10-CM | POA: Diagnosis not present

## 2020-03-08 DIAGNOSIS — Z1231 Encounter for screening mammogram for malignant neoplasm of breast: Secondary | ICD-10-CM | POA: Diagnosis not present

## 2020-03-08 DIAGNOSIS — J301 Allergic rhinitis due to pollen: Secondary | ICD-10-CM | POA: Diagnosis not present

## 2020-03-08 DIAGNOSIS — H01001 Unspecified blepharitis right upper eyelid: Secondary | ICD-10-CM | POA: Diagnosis not present

## 2020-03-08 DIAGNOSIS — M797 Fibromyalgia: Secondary | ICD-10-CM | POA: Diagnosis not present

## 2020-03-08 DIAGNOSIS — T7840XA Allergy, unspecified, initial encounter: Secondary | ICD-10-CM | POA: Diagnosis not present

## 2020-03-08 DIAGNOSIS — D84821 Immunodeficiency due to drugs: Secondary | ICD-10-CM | POA: Diagnosis not present

## 2020-03-08 DIAGNOSIS — T380X5D Adverse effect of glucocorticoids and synthetic analogues, subsequent encounter: Secondary | ICD-10-CM | POA: Diagnosis not present

## 2020-03-08 DIAGNOSIS — M79641 Pain in right hand: Secondary | ICD-10-CM | POA: Diagnosis not present

## 2020-03-08 DIAGNOSIS — K5900 Constipation, unspecified: Secondary | ICD-10-CM | POA: Diagnosis not present

## 2020-03-08 DIAGNOSIS — M2041 Other hammer toe(s) (acquired), right foot: Secondary | ICD-10-CM | POA: Diagnosis not present

## 2020-03-08 DIAGNOSIS — N528 Other male erectile dysfunction: Secondary | ICD-10-CM | POA: Diagnosis not present

## 2020-03-08 DIAGNOSIS — M25641 Stiffness of right hand, not elsewhere classified: Secondary | ICD-10-CM | POA: Diagnosis not present

## 2020-03-08 DIAGNOSIS — M79672 Pain in left foot: Secondary | ICD-10-CM | POA: Diagnosis not present

## 2020-03-08 DIAGNOSIS — M519 Unspecified thoracic, thoracolumbar and lumbosacral intervertebral disc disorder: Secondary | ICD-10-CM | POA: Diagnosis not present

## 2020-03-08 DIAGNOSIS — L02612 Cutaneous abscess of left foot: Secondary | ICD-10-CM | POA: Diagnosis not present

## 2020-03-08 DIAGNOSIS — Z96653 Presence of artificial knee joint, bilateral: Secondary | ICD-10-CM | POA: Diagnosis not present

## 2020-03-08 DIAGNOSIS — R296 Repeated falls: Secondary | ICD-10-CM | POA: Diagnosis not present

## 2020-03-08 DIAGNOSIS — L723 Sebaceous cyst: Secondary | ICD-10-CM | POA: Diagnosis not present

## 2020-03-08 DIAGNOSIS — N182 Chronic kidney disease, stage 2 (mild): Secondary | ICD-10-CM | POA: Diagnosis not present

## 2020-03-08 DIAGNOSIS — M7581 Other shoulder lesions, right shoulder: Secondary | ICD-10-CM | POA: Diagnosis not present

## 2020-03-08 DIAGNOSIS — M67449 Ganglion, unspecified hand: Secondary | ICD-10-CM | POA: Diagnosis not present

## 2020-03-08 DIAGNOSIS — E113393 Type 2 diabetes mellitus with moderate nonproliferative diabetic retinopathy without macular edema, bilateral: Secondary | ICD-10-CM | POA: Diagnosis not present

## 2020-03-08 DIAGNOSIS — B0229 Other postherpetic nervous system involvement: Secondary | ICD-10-CM | POA: Diagnosis not present

## 2020-03-08 DIAGNOSIS — D369 Benign neoplasm, unspecified site: Secondary | ICD-10-CM | POA: Diagnosis not present

## 2020-03-08 DIAGNOSIS — D2239 Melanocytic nevi of other parts of face: Secondary | ICD-10-CM | POA: Diagnosis not present

## 2020-03-08 DIAGNOSIS — Z9889 Other specified postprocedural states: Secondary | ICD-10-CM | POA: Diagnosis not present

## 2020-03-08 DIAGNOSIS — Z823 Family history of stroke: Secondary | ICD-10-CM | POA: Diagnosis not present

## 2020-03-08 DIAGNOSIS — F0390 Unspecified dementia without behavioral disturbance: Secondary | ICD-10-CM | POA: Diagnosis not present

## 2020-03-08 DIAGNOSIS — Z139 Encounter for screening, unspecified: Secondary | ICD-10-CM | POA: Diagnosis not present

## 2020-03-08 DIAGNOSIS — H26492 Other secondary cataract, left eye: Secondary | ICD-10-CM | POA: Diagnosis not present

## 2020-03-08 DIAGNOSIS — C159 Malignant neoplasm of esophagus, unspecified: Secondary | ICD-10-CM | POA: Diagnosis not present

## 2020-03-08 DIAGNOSIS — G43909 Migraine, unspecified, not intractable, without status migrainosus: Secondary | ICD-10-CM | POA: Diagnosis not present

## 2020-03-08 DIAGNOSIS — R635 Abnormal weight gain: Secondary | ICD-10-CM | POA: Diagnosis not present

## 2020-03-08 DIAGNOSIS — F4541 Pain disorder exclusively related to psychological factors: Secondary | ICD-10-CM | POA: Diagnosis not present

## 2020-03-08 DIAGNOSIS — M19031 Primary osteoarthritis, right wrist: Secondary | ICD-10-CM | POA: Diagnosis not present

## 2020-03-08 DIAGNOSIS — Z947 Corneal transplant status: Secondary | ICD-10-CM | POA: Diagnosis not present

## 2020-03-08 DIAGNOSIS — M4802 Spinal stenosis, cervical region: Secondary | ICD-10-CM | POA: Diagnosis not present

## 2020-03-08 DIAGNOSIS — R030 Elevated blood-pressure reading, without diagnosis of hypertension: Secondary | ICD-10-CM | POA: Diagnosis not present

## 2020-03-08 DIAGNOSIS — Z6825 Body mass index (BMI) 25.0-25.9, adult: Secondary | ICD-10-CM | POA: Diagnosis not present

## 2020-03-08 DIAGNOSIS — R350 Frequency of micturition: Secondary | ICD-10-CM | POA: Diagnosis not present

## 2020-03-08 DIAGNOSIS — Z1211 Encounter for screening for malignant neoplasm of colon: Secondary | ICD-10-CM | POA: Diagnosis not present

## 2020-03-08 DIAGNOSIS — D62 Acute posthemorrhagic anemia: Secondary | ICD-10-CM | POA: Diagnosis not present

## 2020-03-08 DIAGNOSIS — Z872 Personal history of diseases of the skin and subcutaneous tissue: Secondary | ICD-10-CM | POA: Diagnosis not present

## 2020-03-08 DIAGNOSIS — I480 Paroxysmal atrial fibrillation: Secondary | ICD-10-CM | POA: Diagnosis not present

## 2020-03-08 DIAGNOSIS — K912 Postsurgical malabsorption, not elsewhere classified: Secondary | ICD-10-CM | POA: Diagnosis not present

## 2020-03-08 DIAGNOSIS — M25762 Osteophyte, left knee: Secondary | ICD-10-CM | POA: Diagnosis not present

## 2020-03-08 DIAGNOSIS — R4701 Aphasia: Secondary | ICD-10-CM | POA: Diagnosis not present

## 2020-03-08 DIAGNOSIS — C07 Malignant neoplasm of parotid gland: Secondary | ICD-10-CM | POA: Diagnosis not present

## 2020-03-08 DIAGNOSIS — N319 Neuromuscular dysfunction of bladder, unspecified: Secondary | ICD-10-CM | POA: Diagnosis not present

## 2020-03-08 DIAGNOSIS — I5020 Unspecified systolic (congestive) heart failure: Secondary | ICD-10-CM | POA: Diagnosis not present

## 2020-03-08 DIAGNOSIS — R3121 Asymptomatic microscopic hematuria: Secondary | ICD-10-CM | POA: Diagnosis not present

## 2020-03-08 DIAGNOSIS — B370 Candidal stomatitis: Secondary | ICD-10-CM | POA: Diagnosis not present

## 2020-03-08 DIAGNOSIS — G238 Other specified degenerative diseases of basal ganglia: Secondary | ICD-10-CM | POA: Diagnosis not present

## 2020-03-08 DIAGNOSIS — Z01812 Encounter for preprocedural laboratory examination: Secondary | ICD-10-CM | POA: Diagnosis not present

## 2020-03-08 DIAGNOSIS — Z1212 Encounter for screening for malignant neoplasm of rectum: Secondary | ICD-10-CM | POA: Diagnosis not present

## 2020-03-08 DIAGNOSIS — Z953 Presence of xenogenic heart valve: Secondary | ICD-10-CM | POA: Diagnosis not present

## 2020-03-08 DIAGNOSIS — M9905 Segmental and somatic dysfunction of pelvic region: Secondary | ICD-10-CM | POA: Diagnosis not present

## 2020-03-08 DIAGNOSIS — I4510 Unspecified right bundle-branch block: Secondary | ICD-10-CM | POA: Diagnosis not present

## 2020-03-08 DIAGNOSIS — M5146 Schmorl's nodes, lumbar region: Secondary | ICD-10-CM | POA: Diagnosis not present

## 2020-03-08 DIAGNOSIS — S78111D Complete traumatic amputation at level between right hip and knee, subsequent encounter: Secondary | ICD-10-CM | POA: Diagnosis not present

## 2020-03-08 DIAGNOSIS — I6522 Occlusion and stenosis of left carotid artery: Secondary | ICD-10-CM | POA: Diagnosis not present

## 2020-03-08 DIAGNOSIS — E11621 Type 2 diabetes mellitus with foot ulcer: Secondary | ICD-10-CM | POA: Diagnosis not present

## 2020-03-08 DIAGNOSIS — D518 Other vitamin B12 deficiency anemias: Secondary | ICD-10-CM | POA: Diagnosis not present

## 2020-03-08 DIAGNOSIS — S72041A Displaced fracture of base of neck of right femur, initial encounter for closed fracture: Secondary | ICD-10-CM | POA: Diagnosis not present

## 2020-03-08 DIAGNOSIS — G308 Other Alzheimer's disease: Secondary | ICD-10-CM | POA: Diagnosis not present

## 2020-03-08 DIAGNOSIS — L4050 Arthropathic psoriasis, unspecified: Secondary | ICD-10-CM | POA: Diagnosis not present

## 2020-03-08 DIAGNOSIS — H2512 Age-related nuclear cataract, left eye: Secondary | ICD-10-CM | POA: Diagnosis not present

## 2020-03-08 DIAGNOSIS — F1020 Alcohol dependence, uncomplicated: Secondary | ICD-10-CM | POA: Diagnosis not present

## 2020-03-08 DIAGNOSIS — F4310 Post-traumatic stress disorder, unspecified: Secondary | ICD-10-CM | POA: Diagnosis not present

## 2020-03-08 DIAGNOSIS — Z981 Arthrodesis status: Secondary | ICD-10-CM | POA: Diagnosis not present

## 2020-03-08 DIAGNOSIS — M5431 Sciatica, right side: Secondary | ICD-10-CM | POA: Diagnosis not present

## 2020-03-08 DIAGNOSIS — G8194 Hemiplegia, unspecified affecting left nondominant side: Secondary | ICD-10-CM | POA: Diagnosis not present

## 2020-03-08 DIAGNOSIS — H11153 Pinguecula, bilateral: Secondary | ICD-10-CM | POA: Diagnosis not present

## 2020-03-08 DIAGNOSIS — I714 Abdominal aortic aneurysm, without rupture: Secondary | ICD-10-CM | POA: Diagnosis not present

## 2020-03-08 DIAGNOSIS — E113512 Type 2 diabetes mellitus with proliferative diabetic retinopathy with macular edema, left eye: Secondary | ICD-10-CM | POA: Diagnosis not present

## 2020-03-08 DIAGNOSIS — J45901 Unspecified asthma with (acute) exacerbation: Secondary | ICD-10-CM | POA: Diagnosis not present

## 2020-03-08 DIAGNOSIS — K7010 Alcoholic hepatitis without ascites: Secondary | ICD-10-CM | POA: Diagnosis not present

## 2020-03-08 DIAGNOSIS — H353211 Exudative age-related macular degeneration, right eye, with active choroidal neovascularization: Secondary | ICD-10-CM | POA: Diagnosis not present

## 2020-03-08 DIAGNOSIS — N184 Chronic kidney disease, stage 4 (severe): Secondary | ICD-10-CM | POA: Diagnosis not present

## 2020-03-08 DIAGNOSIS — G40909 Epilepsy, unspecified, not intractable, without status epilepticus: Secondary | ICD-10-CM | POA: Diagnosis not present

## 2020-03-08 DIAGNOSIS — H25093 Other age-related incipient cataract, bilateral: Secondary | ICD-10-CM | POA: Diagnosis not present

## 2020-03-08 DIAGNOSIS — E7849 Other hyperlipidemia: Secondary | ICD-10-CM | POA: Diagnosis not present

## 2020-03-08 DIAGNOSIS — Z131 Encounter for screening for diabetes mellitus: Secondary | ICD-10-CM | POA: Diagnosis not present

## 2020-03-08 DIAGNOSIS — H25011 Cortical age-related cataract, right eye: Secondary | ICD-10-CM | POA: Diagnosis not present

## 2020-03-08 DIAGNOSIS — R0982 Postnasal drip: Secondary | ICD-10-CM | POA: Diagnosis not present

## 2020-03-08 DIAGNOSIS — M47812 Spondylosis without myelopathy or radiculopathy, cervical region: Secondary | ICD-10-CM | POA: Diagnosis not present

## 2020-03-08 DIAGNOSIS — Z471 Aftercare following joint replacement surgery: Secondary | ICD-10-CM | POA: Diagnosis not present

## 2020-03-08 DIAGNOSIS — S62636D Displaced fracture of distal phalanx of right little finger, subsequent encounter for fracture with routine healing: Secondary | ICD-10-CM | POA: Diagnosis not present

## 2020-03-08 DIAGNOSIS — R9431 Abnormal electrocardiogram [ECG] [EKG]: Secondary | ICD-10-CM | POA: Diagnosis not present

## 2020-03-08 DIAGNOSIS — E1121 Type 2 diabetes mellitus with diabetic nephropathy: Secondary | ICD-10-CM | POA: Diagnosis not present

## 2020-03-08 DIAGNOSIS — H6123 Impacted cerumen, bilateral: Secondary | ICD-10-CM | POA: Diagnosis not present

## 2020-03-08 DIAGNOSIS — Z13220 Encounter for screening for lipoid disorders: Secondary | ICD-10-CM | POA: Diagnosis not present

## 2020-03-08 DIAGNOSIS — Z5181 Encounter for therapeutic drug level monitoring: Secondary | ICD-10-CM | POA: Diagnosis not present

## 2020-03-08 DIAGNOSIS — R3915 Urgency of urination: Secondary | ICD-10-CM | POA: Diagnosis not present

## 2020-03-08 DIAGNOSIS — J690 Pneumonitis due to inhalation of food and vomit: Secondary | ICD-10-CM | POA: Diagnosis not present

## 2020-03-08 DIAGNOSIS — Z85828 Personal history of other malignant neoplasm of skin: Secondary | ICD-10-CM | POA: Diagnosis not present

## 2020-03-08 DIAGNOSIS — Z9689 Presence of other specified functional implants: Secondary | ICD-10-CM | POA: Diagnosis not present

## 2020-03-08 DIAGNOSIS — K580 Irritable bowel syndrome with diarrhea: Secondary | ICD-10-CM | POA: Diagnosis not present

## 2020-03-08 DIAGNOSIS — F5089 Other specified eating disorder: Secondary | ICD-10-CM | POA: Diagnosis not present

## 2020-03-08 DIAGNOSIS — E118 Type 2 diabetes mellitus with unspecified complications: Secondary | ICD-10-CM | POA: Diagnosis not present

## 2020-03-08 DIAGNOSIS — R7989 Other specified abnormal findings of blood chemistry: Secondary | ICD-10-CM | POA: Diagnosis not present

## 2020-03-08 DIAGNOSIS — Z23 Encounter for immunization: Secondary | ICD-10-CM | POA: Diagnosis not present

## 2020-03-08 DIAGNOSIS — R339 Retention of urine, unspecified: Secondary | ICD-10-CM | POA: Diagnosis not present

## 2020-03-08 DIAGNOSIS — J3089 Other allergic rhinitis: Secondary | ICD-10-CM | POA: Diagnosis not present

## 2020-03-08 DIAGNOSIS — J1289 Other viral pneumonia: Secondary | ICD-10-CM | POA: Diagnosis not present

## 2020-03-08 DIAGNOSIS — R569 Unspecified convulsions: Secondary | ICD-10-CM | POA: Diagnosis not present

## 2020-03-08 DIAGNOSIS — K7030 Alcoholic cirrhosis of liver without ascites: Secondary | ICD-10-CM | POA: Diagnosis not present

## 2020-03-08 DIAGNOSIS — M25562 Pain in left knee: Secondary | ICD-10-CM | POA: Diagnosis not present

## 2020-03-08 DIAGNOSIS — M8588 Other specified disorders of bone density and structure, other site: Secondary | ICD-10-CM | POA: Diagnosis not present

## 2020-03-08 DIAGNOSIS — K635 Polyp of colon: Secondary | ICD-10-CM | POA: Diagnosis not present

## 2020-03-08 DIAGNOSIS — E860 Dehydration: Secondary | ICD-10-CM | POA: Diagnosis not present

## 2020-03-08 DIAGNOSIS — M75121 Complete rotator cuff tear or rupture of right shoulder, not specified as traumatic: Secondary | ICD-10-CM | POA: Diagnosis not present

## 2020-03-08 DIAGNOSIS — I472 Ventricular tachycardia: Secondary | ICD-10-CM | POA: Diagnosis not present

## 2020-03-08 DIAGNOSIS — F909 Attention-deficit hyperactivity disorder, unspecified type: Secondary | ICD-10-CM | POA: Diagnosis not present

## 2020-03-08 DIAGNOSIS — N390 Urinary tract infection, site not specified: Secondary | ICD-10-CM | POA: Diagnosis not present

## 2020-03-08 DIAGNOSIS — L400 Psoriasis vulgaris: Secondary | ICD-10-CM | POA: Diagnosis not present

## 2020-03-08 DIAGNOSIS — M7551 Bursitis of right shoulder: Secondary | ICD-10-CM | POA: Diagnosis not present

## 2020-03-08 DIAGNOSIS — R262 Difficulty in walking, not elsewhere classified: Secondary | ICD-10-CM | POA: Diagnosis not present

## 2020-03-08 DIAGNOSIS — K5090 Crohn's disease, unspecified, without complications: Secondary | ICD-10-CM | POA: Diagnosis not present

## 2020-03-08 DIAGNOSIS — J9811 Atelectasis: Secondary | ICD-10-CM | POA: Diagnosis not present

## 2020-03-08 DIAGNOSIS — G629 Polyneuropathy, unspecified: Secondary | ICD-10-CM | POA: Diagnosis not present

## 2020-03-08 DIAGNOSIS — E790 Hyperuricemia without signs of inflammatory arthritis and tophaceous disease: Secondary | ICD-10-CM | POA: Diagnosis not present

## 2020-03-08 DIAGNOSIS — K224 Dyskinesia of esophagus: Secondary | ICD-10-CM | POA: Diagnosis not present

## 2020-03-08 DIAGNOSIS — R110 Nausea: Secondary | ICD-10-CM | POA: Diagnosis not present

## 2020-03-08 DIAGNOSIS — M7061 Trochanteric bursitis, right hip: Secondary | ICD-10-CM | POA: Diagnosis not present

## 2020-03-08 DIAGNOSIS — M9902 Segmental and somatic dysfunction of thoracic region: Secondary | ICD-10-CM | POA: Diagnosis not present

## 2020-03-08 DIAGNOSIS — R109 Unspecified abdominal pain: Secondary | ICD-10-CM | POA: Diagnosis not present

## 2020-03-08 DIAGNOSIS — N5201 Erectile dysfunction due to arterial insufficiency: Secondary | ICD-10-CM | POA: Diagnosis not present

## 2020-03-08 DIAGNOSIS — F5104 Psychophysiologic insomnia: Secondary | ICD-10-CM | POA: Diagnosis not present

## 2020-03-08 DIAGNOSIS — E1159 Type 2 diabetes mellitus with other circulatory complications: Secondary | ICD-10-CM | POA: Diagnosis not present

## 2020-03-08 DIAGNOSIS — J668 Airway disease due to other specific organic dusts: Secondary | ICD-10-CM | POA: Diagnosis not present

## 2020-03-08 DIAGNOSIS — Z87440 Personal history of urinary (tract) infections: Secondary | ICD-10-CM | POA: Diagnosis not present

## 2020-03-08 DIAGNOSIS — G4733 Obstructive sleep apnea (adult) (pediatric): Secondary | ICD-10-CM | POA: Diagnosis not present

## 2020-03-08 DIAGNOSIS — R079 Chest pain, unspecified: Secondary | ICD-10-CM | POA: Diagnosis not present

## 2020-03-08 DIAGNOSIS — H26493 Other secondary cataract, bilateral: Secondary | ICD-10-CM | POA: Diagnosis not present

## 2020-03-08 DIAGNOSIS — M9901 Segmental and somatic dysfunction of cervical region: Secondary | ICD-10-CM | POA: Diagnosis not present

## 2020-03-08 DIAGNOSIS — R29898 Other symptoms and signs involving the musculoskeletal system: Secondary | ICD-10-CM | POA: Diagnosis not present

## 2020-03-08 DIAGNOSIS — R251 Tremor, unspecified: Secondary | ICD-10-CM | POA: Diagnosis not present

## 2020-03-08 DIAGNOSIS — Z961 Presence of intraocular lens: Secondary | ICD-10-CM | POA: Diagnosis not present

## 2020-03-08 DIAGNOSIS — I771 Stricture of artery: Secondary | ICD-10-CM | POA: Diagnosis not present

## 2020-03-08 DIAGNOSIS — Z780 Asymptomatic menopausal state: Secondary | ICD-10-CM | POA: Diagnosis not present

## 2020-03-08 DIAGNOSIS — F0281 Dementia in other diseases classified elsewhere with behavioral disturbance: Secondary | ICD-10-CM | POA: Diagnosis not present

## 2020-03-08 DIAGNOSIS — R072 Precordial pain: Secondary | ICD-10-CM | POA: Diagnosis not present

## 2020-03-08 DIAGNOSIS — E46 Unspecified protein-calorie malnutrition: Secondary | ICD-10-CM | POA: Diagnosis not present

## 2020-03-08 DIAGNOSIS — F450 Somatization disorder: Secondary | ICD-10-CM | POA: Diagnosis not present

## 2020-03-08 DIAGNOSIS — D72829 Elevated white blood cell count, unspecified: Secondary | ICD-10-CM | POA: Diagnosis not present

## 2020-03-08 DIAGNOSIS — I513 Intracardiac thrombosis, not elsewhere classified: Secondary | ICD-10-CM | POA: Diagnosis not present

## 2020-03-08 DIAGNOSIS — M869 Osteomyelitis, unspecified: Secondary | ICD-10-CM | POA: Diagnosis not present

## 2020-03-08 DIAGNOSIS — R4182 Altered mental status, unspecified: Secondary | ICD-10-CM | POA: Diagnosis not present

## 2020-03-08 DIAGNOSIS — Z452 Encounter for adjustment and management of vascular access device: Secondary | ICD-10-CM | POA: Diagnosis not present

## 2020-03-08 DIAGNOSIS — G609 Hereditary and idiopathic neuropathy, unspecified: Secondary | ICD-10-CM | POA: Diagnosis not present

## 2020-03-08 DIAGNOSIS — H353122 Nonexudative age-related macular degeneration, left eye, intermediate dry stage: Secondary | ICD-10-CM | POA: Diagnosis not present

## 2020-03-08 DIAGNOSIS — G959 Disease of spinal cord, unspecified: Secondary | ICD-10-CM | POA: Diagnosis not present

## 2020-03-08 DIAGNOSIS — Z125 Encounter for screening for malignant neoplasm of prostate: Secondary | ICD-10-CM | POA: Diagnosis not present

## 2020-03-08 DIAGNOSIS — N958 Other specified menopausal and perimenopausal disorders: Secondary | ICD-10-CM | POA: Diagnosis not present

## 2020-03-08 DIAGNOSIS — C50412 Malignant neoplasm of upper-outer quadrant of left female breast: Secondary | ICD-10-CM | POA: Diagnosis not present

## 2020-03-08 DIAGNOSIS — C50312 Malignant neoplasm of lower-inner quadrant of left female breast: Secondary | ICD-10-CM | POA: Diagnosis not present

## 2020-03-08 DIAGNOSIS — R2231 Localized swelling, mass and lump, right upper limb: Secondary | ICD-10-CM | POA: Diagnosis not present

## 2020-03-08 DIAGNOSIS — M9903 Segmental and somatic dysfunction of lumbar region: Secondary | ICD-10-CM | POA: Diagnosis not present

## 2020-03-08 DIAGNOSIS — K50818 Crohn's disease of both small and large intestine with other complication: Secondary | ICD-10-CM | POA: Diagnosis not present

## 2020-03-08 DIAGNOSIS — N411 Chronic prostatitis: Secondary | ICD-10-CM | POA: Diagnosis not present

## 2020-03-08 DIAGNOSIS — R1310 Dysphagia, unspecified: Secondary | ICD-10-CM | POA: Diagnosis not present

## 2020-03-08 DIAGNOSIS — Z880 Allergy status to penicillin: Secondary | ICD-10-CM | POA: Diagnosis not present

## 2020-03-08 DIAGNOSIS — W19XXXD Unspecified fall, subsequent encounter: Secondary | ICD-10-CM | POA: Diagnosis not present

## 2020-03-08 DIAGNOSIS — M2170 Unequal limb length (acquired), unspecified site: Secondary | ICD-10-CM | POA: Diagnosis not present

## 2020-03-08 DIAGNOSIS — M19071 Primary osteoarthritis, right ankle and foot: Secondary | ICD-10-CM | POA: Diagnosis not present

## 2020-03-08 DIAGNOSIS — M898X6 Other specified disorders of bone, lower leg: Secondary | ICD-10-CM | POA: Diagnosis not present

## 2020-03-08 DIAGNOSIS — A059 Bacterial foodborne intoxication, unspecified: Secondary | ICD-10-CM | POA: Diagnosis not present

## 2020-03-08 DIAGNOSIS — S68119S Complete traumatic metacarpophalangeal amputation of unspecified finger, sequela: Secondary | ICD-10-CM | POA: Diagnosis not present

## 2020-03-08 DIAGNOSIS — R531 Weakness: Secondary | ICD-10-CM | POA: Diagnosis not present

## 2020-03-08 DIAGNOSIS — D61818 Other pancytopenia: Secondary | ICD-10-CM | POA: Diagnosis not present

## 2020-03-08 DIAGNOSIS — M1710 Unilateral primary osteoarthritis, unspecified knee: Secondary | ICD-10-CM | POA: Diagnosis not present

## 2020-03-08 DIAGNOSIS — Z9989 Dependence on other enabling machines and devices: Secondary | ICD-10-CM | POA: Diagnosis not present

## 2020-03-08 DIAGNOSIS — M5416 Radiculopathy, lumbar region: Secondary | ICD-10-CM | POA: Diagnosis not present

## 2020-03-08 DIAGNOSIS — A419 Sepsis, unspecified organism: Secondary | ICD-10-CM | POA: Diagnosis not present

## 2020-03-08 DIAGNOSIS — D440 Neoplasm of uncertain behavior of thyroid gland: Secondary | ICD-10-CM | POA: Diagnosis not present

## 2020-03-08 DIAGNOSIS — R768 Other specified abnormal immunological findings in serum: Secondary | ICD-10-CM | POA: Diagnosis not present

## 2020-03-08 DIAGNOSIS — D122 Benign neoplasm of ascending colon: Secondary | ICD-10-CM | POA: Diagnosis not present

## 2020-03-08 DIAGNOSIS — J4540 Moderate persistent asthma, uncomplicated: Secondary | ICD-10-CM | POA: Diagnosis not present

## 2020-03-08 DIAGNOSIS — F321 Major depressive disorder, single episode, moderate: Secondary | ICD-10-CM | POA: Diagnosis not present

## 2020-03-08 DIAGNOSIS — Z124 Encounter for screening for malignant neoplasm of cervix: Secondary | ICD-10-CM | POA: Diagnosis not present

## 2020-03-08 DIAGNOSIS — J411 Mucopurulent chronic bronchitis: Secondary | ICD-10-CM | POA: Diagnosis not present

## 2020-03-08 DIAGNOSIS — C771 Secondary and unspecified malignant neoplasm of intrathoracic lymph nodes: Secondary | ICD-10-CM | POA: Diagnosis not present

## 2020-03-08 DIAGNOSIS — I359 Nonrheumatic aortic valve disorder, unspecified: Secondary | ICD-10-CM | POA: Diagnosis not present

## 2020-03-08 DIAGNOSIS — M955 Acquired deformity of pelvis: Secondary | ICD-10-CM | POA: Diagnosis not present

## 2020-03-08 DIAGNOSIS — M79662 Pain in left lower leg: Secondary | ICD-10-CM | POA: Diagnosis not present

## 2020-03-08 DIAGNOSIS — M5126 Other intervertebral disc displacement, lumbar region: Secondary | ICD-10-CM | POA: Diagnosis not present

## 2020-03-08 DIAGNOSIS — Z7951 Long term (current) use of inhaled steroids: Secondary | ICD-10-CM | POA: Diagnosis not present

## 2020-03-08 DIAGNOSIS — S22088D Other fracture of T11-T12 vertebra, subsequent encounter for fracture with routine healing: Secondary | ICD-10-CM | POA: Diagnosis not present

## 2020-03-08 DIAGNOSIS — I081 Rheumatic disorders of both mitral and tricuspid valves: Secondary | ICD-10-CM | POA: Diagnosis not present

## 2020-03-08 DIAGNOSIS — C50511 Malignant neoplasm of lower-outer quadrant of right female breast: Secondary | ICD-10-CM | POA: Diagnosis not present

## 2020-03-08 DIAGNOSIS — E10319 Type 1 diabetes mellitus with unspecified diabetic retinopathy without macular edema: Secondary | ICD-10-CM | POA: Diagnosis not present

## 2020-03-08 DIAGNOSIS — M25661 Stiffness of right knee, not elsewhere classified: Secondary | ICD-10-CM | POA: Diagnosis not present

## 2020-03-08 DIAGNOSIS — Z95828 Presence of other vascular implants and grafts: Secondary | ICD-10-CM | POA: Diagnosis not present

## 2020-03-08 DIAGNOSIS — Z9181 History of falling: Secondary | ICD-10-CM | POA: Diagnosis not present

## 2020-03-08 DIAGNOSIS — R351 Nocturia: Secondary | ICD-10-CM | POA: Diagnosis not present

## 2020-03-08 DIAGNOSIS — S90415A Abrasion, left lesser toe(s), initial encounter: Secondary | ICD-10-CM | POA: Diagnosis not present

## 2020-03-08 DIAGNOSIS — R32 Unspecified urinary incontinence: Secondary | ICD-10-CM | POA: Diagnosis not present

## 2020-03-08 DIAGNOSIS — C3431 Malignant neoplasm of lower lobe, right bronchus or lung: Secondary | ICD-10-CM | POA: Diagnosis not present

## 2020-03-08 DIAGNOSIS — H35041 Retinal micro-aneurysms, unspecified, right eye: Secondary | ICD-10-CM | POA: Diagnosis not present

## 2020-03-08 DIAGNOSIS — R0600 Dyspnea, unspecified: Secondary | ICD-10-CM | POA: Diagnosis not present

## 2020-03-08 DIAGNOSIS — Z96619 Presence of unspecified artificial shoulder joint: Secondary | ICD-10-CM | POA: Diagnosis not present

## 2020-03-08 DIAGNOSIS — H2511 Age-related nuclear cataract, right eye: Secondary | ICD-10-CM | POA: Diagnosis not present

## 2020-03-08 DIAGNOSIS — R069 Unspecified abnormalities of breathing: Secondary | ICD-10-CM | POA: Diagnosis not present

## 2020-03-08 DIAGNOSIS — L508 Other urticaria: Secondary | ICD-10-CM | POA: Diagnosis not present

## 2020-03-08 DIAGNOSIS — Z808 Family history of malignant neoplasm of other organs or systems: Secondary | ICD-10-CM | POA: Diagnosis not present

## 2020-03-08 DIAGNOSIS — E039 Hypothyroidism, unspecified: Secondary | ICD-10-CM | POA: Diagnosis not present

## 2020-03-08 DIAGNOSIS — Z85118 Personal history of other malignant neoplasm of bronchus and lung: Secondary | ICD-10-CM | POA: Diagnosis not present

## 2020-03-08 DIAGNOSIS — I071 Rheumatic tricuspid insufficiency: Secondary | ICD-10-CM | POA: Diagnosis not present

## 2020-03-08 DIAGNOSIS — E782 Mixed hyperlipidemia: Secondary | ICD-10-CM | POA: Diagnosis not present

## 2020-03-08 DIAGNOSIS — Z8679 Personal history of other diseases of the circulatory system: Secondary | ICD-10-CM | POA: Diagnosis not present

## 2020-03-08 DIAGNOSIS — S22040A Wedge compression fracture of fourth thoracic vertebra, initial encounter for closed fracture: Secondary | ICD-10-CM | POA: Diagnosis not present

## 2020-03-08 DIAGNOSIS — C538 Malignant neoplasm of overlapping sites of cervix uteri: Secondary | ICD-10-CM | POA: Diagnosis not present

## 2020-03-08 DIAGNOSIS — N179 Acute kidney failure, unspecified: Secondary | ICD-10-CM | POA: Diagnosis not present

## 2020-03-08 DIAGNOSIS — G9009 Other idiopathic peripheral autonomic neuropathy: Secondary | ICD-10-CM | POA: Diagnosis not present

## 2020-03-08 DIAGNOSIS — K2270 Barrett's esophagus without dysplasia: Secondary | ICD-10-CM | POA: Diagnosis not present

## 2020-03-08 DIAGNOSIS — M722 Plantar fascial fibromatosis: Secondary | ICD-10-CM | POA: Diagnosis not present

## 2020-03-08 DIAGNOSIS — N898 Other specified noninflammatory disorders of vagina: Secondary | ICD-10-CM | POA: Diagnosis not present

## 2020-03-08 DIAGNOSIS — M3589 Other specified systemic involvement of connective tissue: Secondary | ICD-10-CM | POA: Diagnosis not present

## 2020-03-08 DIAGNOSIS — H5203 Hypermetropia, bilateral: Secondary | ICD-10-CM | POA: Diagnosis not present

## 2020-03-08 DIAGNOSIS — C61 Malignant neoplasm of prostate: Secondary | ICD-10-CM | POA: Diagnosis not present

## 2020-03-08 DIAGNOSIS — C4441 Basal cell carcinoma of skin of scalp and neck: Secondary | ICD-10-CM | POA: Diagnosis not present

## 2020-03-08 DIAGNOSIS — R8279 Other abnormal findings on microbiological examination of urine: Secondary | ICD-10-CM | POA: Diagnosis not present

## 2020-03-08 DIAGNOSIS — H9190 Unspecified hearing loss, unspecified ear: Secondary | ICD-10-CM | POA: Diagnosis not present

## 2020-03-08 DIAGNOSIS — L719 Rosacea, unspecified: Secondary | ICD-10-CM | POA: Diagnosis not present

## 2020-03-08 DIAGNOSIS — Z6839 Body mass index (BMI) 39.0-39.9, adult: Secondary | ICD-10-CM | POA: Diagnosis not present

## 2020-03-08 DIAGNOSIS — Z85820 Personal history of malignant melanoma of skin: Secondary | ICD-10-CM | POA: Diagnosis not present

## 2020-03-08 DIAGNOSIS — M9904 Segmental and somatic dysfunction of sacral region: Secondary | ICD-10-CM | POA: Diagnosis not present

## 2020-03-08 DIAGNOSIS — D225 Melanocytic nevi of trunk: Secondary | ICD-10-CM | POA: Diagnosis not present

## 2020-03-08 DIAGNOSIS — H43821 Vitreomacular adhesion, right eye: Secondary | ICD-10-CM | POA: Diagnosis not present

## 2020-03-08 DIAGNOSIS — E079 Disorder of thyroid, unspecified: Secondary | ICD-10-CM | POA: Diagnosis not present

## 2020-03-08 DIAGNOSIS — C4321 Malignant melanoma of right ear and external auricular canal: Secondary | ICD-10-CM | POA: Diagnosis not present

## 2020-03-08 DIAGNOSIS — H4901 Third [oculomotor] nerve palsy, right eye: Secondary | ICD-10-CM | POA: Diagnosis not present

## 2020-03-08 DIAGNOSIS — Z6829 Body mass index (BMI) 29.0-29.9, adult: Secondary | ICD-10-CM | POA: Diagnosis not present

## 2020-03-08 DIAGNOSIS — E441 Mild protein-calorie malnutrition: Secondary | ICD-10-CM | POA: Diagnosis not present

## 2020-03-08 DIAGNOSIS — F0280 Dementia in other diseases classified elsewhere without behavioral disturbance: Secondary | ICD-10-CM | POA: Diagnosis not present

## 2020-03-08 DIAGNOSIS — M25571 Pain in right ankle and joints of right foot: Secondary | ICD-10-CM | POA: Diagnosis not present

## 2020-03-08 DIAGNOSIS — C181 Malignant neoplasm of appendix: Secondary | ICD-10-CM | POA: Diagnosis not present

## 2020-03-08 DIAGNOSIS — L700 Acne vulgaris: Secondary | ICD-10-CM | POA: Diagnosis not present

## 2020-03-08 DIAGNOSIS — M5116 Intervertebral disc disorders with radiculopathy, lumbar region: Secondary | ICD-10-CM | POA: Diagnosis not present

## 2020-03-08 DIAGNOSIS — L97509 Non-pressure chronic ulcer of other part of unspecified foot with unspecified severity: Secondary | ICD-10-CM | POA: Diagnosis not present

## 2020-03-08 DIAGNOSIS — L249 Irritant contact dermatitis, unspecified cause: Secondary | ICD-10-CM | POA: Diagnosis not present

## 2020-03-08 DIAGNOSIS — F331 Major depressive disorder, recurrent, moderate: Secondary | ICD-10-CM | POA: Diagnosis not present

## 2020-03-08 DIAGNOSIS — E113392 Type 2 diabetes mellitus with moderate nonproliferative diabetic retinopathy without macular edema, left eye: Secondary | ICD-10-CM | POA: Diagnosis not present

## 2020-03-08 DIAGNOSIS — Z87891 Personal history of nicotine dependence: Secondary | ICD-10-CM | POA: Diagnosis not present

## 2020-03-08 DIAGNOSIS — G119 Hereditary ataxia, unspecified: Secondary | ICD-10-CM | POA: Diagnosis not present

## 2020-03-08 DIAGNOSIS — I495 Sick sinus syndrome: Secondary | ICD-10-CM | POA: Diagnosis not present

## 2020-03-08 DIAGNOSIS — D120 Benign neoplasm of cecum: Secondary | ICD-10-CM | POA: Diagnosis not present

## 2020-03-08 DIAGNOSIS — M064 Inflammatory polyarthropathy: Secondary | ICD-10-CM | POA: Diagnosis not present

## 2020-03-08 DIAGNOSIS — M8589 Other specified disorders of bone density and structure, multiple sites: Secondary | ICD-10-CM | POA: Diagnosis not present

## 2020-03-08 DIAGNOSIS — Z6835 Body mass index (BMI) 35.0-35.9, adult: Secondary | ICD-10-CM | POA: Diagnosis not present

## 2020-03-08 DIAGNOSIS — J9601 Acute respiratory failure with hypoxia: Secondary | ICD-10-CM | POA: Diagnosis not present

## 2020-03-08 DIAGNOSIS — J9809 Other diseases of bronchus, not elsewhere classified: Secondary | ICD-10-CM | POA: Diagnosis not present

## 2020-03-08 DIAGNOSIS — E1165 Type 2 diabetes mellitus with hyperglycemia: Secondary | ICD-10-CM | POA: Diagnosis not present

## 2020-03-08 DIAGNOSIS — M2578 Osteophyte, vertebrae: Secondary | ICD-10-CM | POA: Diagnosis not present

## 2020-03-08 DIAGNOSIS — K621 Rectal polyp: Secondary | ICD-10-CM | POA: Diagnosis not present

## 2020-03-08 DIAGNOSIS — E109 Type 1 diabetes mellitus without complications: Secondary | ICD-10-CM | POA: Diagnosis not present

## 2020-03-08 DIAGNOSIS — R45851 Suicidal ideations: Secondary | ICD-10-CM | POA: Diagnosis not present

## 2020-03-08 DIAGNOSIS — Z1151 Encounter for screening for human papillomavirus (HPV): Secondary | ICD-10-CM | POA: Diagnosis not present

## 2020-03-08 DIAGNOSIS — S39012A Strain of muscle, fascia and tendon of lower back, initial encounter: Secondary | ICD-10-CM | POA: Diagnosis not present

## 2020-03-08 DIAGNOSIS — I890 Lymphedema, not elsewhere classified: Secondary | ICD-10-CM | POA: Diagnosis not present

## 2020-03-08 DIAGNOSIS — G8921 Chronic pain due to trauma: Secondary | ICD-10-CM | POA: Diagnosis not present

## 2020-03-08 DIAGNOSIS — Z952 Presence of prosthetic heart valve: Secondary | ICD-10-CM | POA: Diagnosis not present

## 2020-03-08 DIAGNOSIS — K760 Fatty (change of) liver, not elsewhere classified: Secondary | ICD-10-CM | POA: Diagnosis not present

## 2020-03-08 DIAGNOSIS — S8412XS Injury of peroneal nerve at lower leg level, left leg, sequela: Secondary | ICD-10-CM | POA: Diagnosis not present

## 2020-03-08 DIAGNOSIS — S72051A Unspecified fracture of head of right femur, initial encounter for closed fracture: Secondary | ICD-10-CM | POA: Diagnosis not present

## 2020-03-08 DIAGNOSIS — Z79891 Long term (current) use of opiate analgesic: Secondary | ICD-10-CM | POA: Diagnosis not present

## 2020-03-08 DIAGNOSIS — E1151 Type 2 diabetes mellitus with diabetic peripheral angiopathy without gangrene: Secondary | ICD-10-CM | POA: Diagnosis not present

## 2020-03-08 DIAGNOSIS — G3184 Mild cognitive impairment, so stated: Secondary | ICD-10-CM | POA: Diagnosis not present

## 2020-03-08 DIAGNOSIS — N400 Enlarged prostate without lower urinary tract symptoms: Secondary | ICD-10-CM | POA: Diagnosis not present

## 2020-03-08 DIAGNOSIS — I088 Other rheumatic multiple valve diseases: Secondary | ICD-10-CM | POA: Diagnosis not present

## 2020-03-08 DIAGNOSIS — I051 Rheumatic mitral insufficiency: Secondary | ICD-10-CM | POA: Diagnosis not present

## 2020-03-08 DIAGNOSIS — D2271 Melanocytic nevi of right lower limb, including hip: Secondary | ICD-10-CM | POA: Diagnosis not present

## 2020-03-08 DIAGNOSIS — N186 End stage renal disease: Secondary | ICD-10-CM | POA: Diagnosis not present

## 2020-03-08 DIAGNOSIS — M21371 Foot drop, right foot: Secondary | ICD-10-CM | POA: Diagnosis not present

## 2020-03-08 DIAGNOSIS — I82501 Chronic embolism and thrombosis of unspecified deep veins of right lower extremity: Secondary | ICD-10-CM | POA: Diagnosis not present

## 2020-03-08 DIAGNOSIS — R238 Other skin changes: Secondary | ICD-10-CM | POA: Diagnosis not present

## 2020-03-08 DIAGNOSIS — M25511 Pain in right shoulder: Secondary | ICD-10-CM | POA: Diagnosis not present

## 2020-03-08 DIAGNOSIS — I950 Idiopathic hypotension: Secondary | ICD-10-CM | POA: Diagnosis not present

## 2020-03-08 DIAGNOSIS — R011 Cardiac murmur, unspecified: Secondary | ICD-10-CM | POA: Diagnosis not present

## 2020-03-08 DIAGNOSIS — R748 Abnormal levels of other serum enzymes: Secondary | ICD-10-CM | POA: Diagnosis not present

## 2020-03-08 DIAGNOSIS — I5030 Unspecified diastolic (congestive) heart failure: Secondary | ICD-10-CM | POA: Diagnosis not present

## 2020-03-08 DIAGNOSIS — D2262 Melanocytic nevi of left upper limb, including shoulder: Secondary | ICD-10-CM | POA: Diagnosis not present

## 2020-03-08 DIAGNOSIS — D2272 Melanocytic nevi of left lower limb, including hip: Secondary | ICD-10-CM | POA: Diagnosis not present

## 2020-03-08 DIAGNOSIS — S62339A Displaced fracture of neck of unspecified metacarpal bone, initial encounter for closed fracture: Secondary | ICD-10-CM | POA: Diagnosis not present

## 2020-03-08 DIAGNOSIS — Z9911 Dependence on respirator [ventilator] status: Secondary | ICD-10-CM | POA: Diagnosis not present

## 2020-03-08 DIAGNOSIS — R278 Other lack of coordination: Secondary | ICD-10-CM | POA: Diagnosis not present

## 2020-03-08 DIAGNOSIS — S61319D Laceration without foreign body of unspecified finger with damage to nail, subsequent encounter: Secondary | ICD-10-CM | POA: Diagnosis not present

## 2020-03-08 DIAGNOSIS — J9612 Chronic respiratory failure with hypercapnia: Secondary | ICD-10-CM | POA: Diagnosis not present

## 2020-03-08 DIAGNOSIS — H40003 Preglaucoma, unspecified, bilateral: Secondary | ICD-10-CM | POA: Diagnosis not present

## 2020-03-08 DIAGNOSIS — G4730 Sleep apnea, unspecified: Secondary | ICD-10-CM | POA: Diagnosis not present

## 2020-03-08 DIAGNOSIS — Z85818 Personal history of malignant neoplasm of other sites of lip, oral cavity, and pharynx: Secondary | ICD-10-CM | POA: Diagnosis not present

## 2020-03-08 DIAGNOSIS — I484 Atypical atrial flutter: Secondary | ICD-10-CM | POA: Diagnosis not present

## 2020-03-08 DIAGNOSIS — F341 Dysthymic disorder: Secondary | ICD-10-CM | POA: Diagnosis not present

## 2020-03-08 DIAGNOSIS — D649 Anemia, unspecified: Secondary | ICD-10-CM | POA: Diagnosis not present

## 2020-03-08 DIAGNOSIS — M5417 Radiculopathy, lumbosacral region: Secondary | ICD-10-CM | POA: Diagnosis not present

## 2020-03-08 DIAGNOSIS — G4489 Other headache syndrome: Secondary | ICD-10-CM | POA: Diagnosis not present

## 2020-03-08 DIAGNOSIS — I959 Hypotension, unspecified: Secondary | ICD-10-CM | POA: Diagnosis not present

## 2020-03-08 DIAGNOSIS — L309 Dermatitis, unspecified: Secondary | ICD-10-CM | POA: Diagnosis not present

## 2020-03-08 DIAGNOSIS — N200 Calculus of kidney: Secondary | ICD-10-CM | POA: Diagnosis not present

## 2020-03-08 DIAGNOSIS — M170 Bilateral primary osteoarthritis of knee: Secondary | ICD-10-CM | POA: Diagnosis not present

## 2020-03-08 DIAGNOSIS — M792 Neuralgia and neuritis, unspecified: Secondary | ICD-10-CM | POA: Diagnosis not present

## 2020-03-08 DIAGNOSIS — Z6832 Body mass index (BMI) 32.0-32.9, adult: Secondary | ICD-10-CM | POA: Diagnosis not present

## 2020-03-08 DIAGNOSIS — G8918 Other acute postprocedural pain: Secondary | ICD-10-CM | POA: Diagnosis not present

## 2020-03-08 DIAGNOSIS — G992 Myelopathy in diseases classified elsewhere: Secondary | ICD-10-CM | POA: Diagnosis not present

## 2020-03-08 DIAGNOSIS — Z87898 Personal history of other specified conditions: Secondary | ICD-10-CM | POA: Diagnosis not present

## 2020-03-09 DIAGNOSIS — N6489 Other specified disorders of breast: Secondary | ICD-10-CM | POA: Diagnosis not present

## 2020-03-09 DIAGNOSIS — R220 Localized swelling, mass and lump, head: Secondary | ICD-10-CM | POA: Diagnosis not present

## 2020-03-09 DIAGNOSIS — H35371 Puckering of macula, right eye: Secondary | ICD-10-CM | POA: Diagnosis not present

## 2020-03-09 DIAGNOSIS — R3912 Poor urinary stream: Secondary | ICD-10-CM | POA: Diagnosis not present

## 2020-03-09 DIAGNOSIS — M48062 Spinal stenosis, lumbar region with neurogenic claudication: Secondary | ICD-10-CM | POA: Diagnosis not present

## 2020-03-09 DIAGNOSIS — L720 Epidermal cyst: Secondary | ICD-10-CM | POA: Diagnosis not present

## 2020-03-09 DIAGNOSIS — M5430 Sciatica, unspecified side: Secondary | ICD-10-CM | POA: Diagnosis not present

## 2020-03-09 DIAGNOSIS — D6869 Other thrombophilia: Secondary | ICD-10-CM | POA: Diagnosis not present

## 2020-03-09 DIAGNOSIS — M5137 Other intervertebral disc degeneration, lumbosacral region: Secondary | ICD-10-CM | POA: Diagnosis not present

## 2020-03-09 DIAGNOSIS — H34231 Retinal artery branch occlusion, right eye: Secondary | ICD-10-CM | POA: Diagnosis not present

## 2020-03-09 DIAGNOSIS — Z4803 Encounter for change or removal of drains: Secondary | ICD-10-CM | POA: Diagnosis not present

## 2020-03-09 DIAGNOSIS — D2272 Melanocytic nevi of left lower limb, including hip: Secondary | ICD-10-CM | POA: Diagnosis not present

## 2020-03-09 DIAGNOSIS — Z8701 Personal history of pneumonia (recurrent): Secondary | ICD-10-CM | POA: Diagnosis not present

## 2020-03-09 DIAGNOSIS — J9601 Acute respiratory failure with hypoxia: Secondary | ICD-10-CM | POA: Diagnosis not present

## 2020-03-09 DIAGNOSIS — J301 Allergic rhinitis due to pollen: Secondary | ICD-10-CM | POA: Diagnosis not present

## 2020-03-09 DIAGNOSIS — M19012 Primary osteoarthritis, left shoulder: Secondary | ICD-10-CM | POA: Diagnosis not present

## 2020-03-09 DIAGNOSIS — H40053 Ocular hypertension, bilateral: Secondary | ICD-10-CM | POA: Diagnosis not present

## 2020-03-09 DIAGNOSIS — L819 Disorder of pigmentation, unspecified: Secondary | ICD-10-CM | POA: Diagnosis not present

## 2020-03-09 DIAGNOSIS — Z7901 Long term (current) use of anticoagulants: Secondary | ICD-10-CM | POA: Diagnosis not present

## 2020-03-09 DIAGNOSIS — D45 Polycythemia vera: Secondary | ICD-10-CM | POA: Diagnosis not present

## 2020-03-09 DIAGNOSIS — L97919 Non-pressure chronic ulcer of unspecified part of right lower leg with unspecified severity: Secondary | ICD-10-CM | POA: Diagnosis not present

## 2020-03-09 DIAGNOSIS — H6122 Impacted cerumen, left ear: Secondary | ICD-10-CM | POA: Diagnosis not present

## 2020-03-09 DIAGNOSIS — T380X5A Adverse effect of glucocorticoids and synthetic analogues, initial encounter: Secondary | ICD-10-CM | POA: Diagnosis not present

## 2020-03-09 DIAGNOSIS — R29898 Other symptoms and signs involving the musculoskeletal system: Secondary | ICD-10-CM | POA: Diagnosis not present

## 2020-03-09 DIAGNOSIS — E032 Hypothyroidism due to medicaments and other exogenous substances: Secondary | ICD-10-CM | POA: Diagnosis not present

## 2020-03-09 DIAGNOSIS — C3412 Malignant neoplasm of upper lobe, left bronchus or lung: Secondary | ICD-10-CM | POA: Diagnosis not present

## 2020-03-09 DIAGNOSIS — I479 Paroxysmal tachycardia, unspecified: Secondary | ICD-10-CM | POA: Diagnosis not present

## 2020-03-09 DIAGNOSIS — Z6832 Body mass index (BMI) 32.0-32.9, adult: Secondary | ICD-10-CM | POA: Diagnosis not present

## 2020-03-09 DIAGNOSIS — C772 Secondary and unspecified malignant neoplasm of intra-abdominal lymph nodes: Secondary | ICD-10-CM | POA: Diagnosis not present

## 2020-03-09 DIAGNOSIS — N486 Induration penis plastica: Secondary | ICD-10-CM | POA: Diagnosis not present

## 2020-03-09 DIAGNOSIS — Q393 Congenital stenosis and stricture of esophagus: Secondary | ICD-10-CM | POA: Diagnosis not present

## 2020-03-09 DIAGNOSIS — J8410 Pulmonary fibrosis, unspecified: Secondary | ICD-10-CM | POA: Diagnosis not present

## 2020-03-09 DIAGNOSIS — M75101 Unspecified rotator cuff tear or rupture of right shoulder, not specified as traumatic: Secondary | ICD-10-CM | POA: Diagnosis not present

## 2020-03-09 DIAGNOSIS — Z7951 Long term (current) use of inhaled steroids: Secondary | ICD-10-CM | POA: Diagnosis not present

## 2020-03-09 DIAGNOSIS — D509 Iron deficiency anemia, unspecified: Secondary | ICD-10-CM | POA: Diagnosis not present

## 2020-03-09 DIAGNOSIS — R310 Gross hematuria: Secondary | ICD-10-CM | POA: Diagnosis not present

## 2020-03-09 DIAGNOSIS — R279 Unspecified lack of coordination: Secondary | ICD-10-CM | POA: Diagnosis not present

## 2020-03-09 DIAGNOSIS — C3492 Malignant neoplasm of unspecified part of left bronchus or lung: Secondary | ICD-10-CM | POA: Diagnosis not present

## 2020-03-09 DIAGNOSIS — M79606 Pain in leg, unspecified: Secondary | ICD-10-CM | POA: Diagnosis not present

## 2020-03-09 DIAGNOSIS — H903 Sensorineural hearing loss, bilateral: Secondary | ICD-10-CM | POA: Diagnosis not present

## 2020-03-09 DIAGNOSIS — K625 Hemorrhage of anus and rectum: Secondary | ICD-10-CM | POA: Diagnosis not present

## 2020-03-09 DIAGNOSIS — J411 Mucopurulent chronic bronchitis: Secondary | ICD-10-CM | POA: Diagnosis not present

## 2020-03-09 DIAGNOSIS — I69332 Monoplegia of upper limb following cerebral infarction affecting left dominant side: Secondary | ICD-10-CM | POA: Diagnosis not present

## 2020-03-09 DIAGNOSIS — H4921 Sixth [abducent] nerve palsy, right eye: Secondary | ICD-10-CM | POA: Diagnosis not present

## 2020-03-09 DIAGNOSIS — R1314 Dysphagia, pharyngoesophageal phase: Secondary | ICD-10-CM | POA: Diagnosis not present

## 2020-03-09 DIAGNOSIS — T8522XD Displacement of intraocular lens, subsequent encounter: Secondary | ICD-10-CM | POA: Diagnosis not present

## 2020-03-09 DIAGNOSIS — Z792 Long term (current) use of antibiotics: Secondary | ICD-10-CM | POA: Diagnosis not present

## 2020-03-09 DIAGNOSIS — Z4822 Encounter for aftercare following kidney transplant: Secondary | ICD-10-CM | POA: Diagnosis not present

## 2020-03-09 DIAGNOSIS — Z952 Presence of prosthetic heart valve: Secondary | ICD-10-CM | POA: Diagnosis not present

## 2020-03-09 DIAGNOSIS — C50912 Malignant neoplasm of unspecified site of left female breast: Secondary | ICD-10-CM | POA: Diagnosis not present

## 2020-03-09 DIAGNOSIS — B349 Viral infection, unspecified: Secondary | ICD-10-CM | POA: Diagnosis not present

## 2020-03-09 DIAGNOSIS — R933 Abnormal findings on diagnostic imaging of other parts of digestive tract: Secondary | ICD-10-CM | POA: Diagnosis not present

## 2020-03-09 DIAGNOSIS — K52831 Collagenous colitis: Secondary | ICD-10-CM | POA: Diagnosis not present

## 2020-03-09 DIAGNOSIS — R350 Frequency of micturition: Secondary | ICD-10-CM | POA: Diagnosis not present

## 2020-03-09 DIAGNOSIS — I4892 Unspecified atrial flutter: Secondary | ICD-10-CM | POA: Diagnosis not present

## 2020-03-09 DIAGNOSIS — M4722 Other spondylosis with radiculopathy, cervical region: Secondary | ICD-10-CM | POA: Diagnosis not present

## 2020-03-09 DIAGNOSIS — J849 Interstitial pulmonary disease, unspecified: Secondary | ICD-10-CM | POA: Diagnosis not present

## 2020-03-09 DIAGNOSIS — Z79899 Other long term (current) drug therapy: Secondary | ICD-10-CM | POA: Diagnosis not present

## 2020-03-09 DIAGNOSIS — M2011 Hallux valgus (acquired), right foot: Secondary | ICD-10-CM | POA: Diagnosis not present

## 2020-03-09 DIAGNOSIS — M5033 Other cervical disc degeneration, cervicothoracic region: Secondary | ICD-10-CM | POA: Diagnosis not present

## 2020-03-09 DIAGNOSIS — M2042 Other hammer toe(s) (acquired), left foot: Secondary | ICD-10-CM | POA: Diagnosis not present

## 2020-03-09 DIAGNOSIS — Z86012 Personal history of benign carcinoid tumor: Secondary | ICD-10-CM | POA: Diagnosis not present

## 2020-03-09 DIAGNOSIS — N170 Acute kidney failure with tubular necrosis: Secondary | ICD-10-CM | POA: Diagnosis not present

## 2020-03-09 DIAGNOSIS — M4726 Other spondylosis with radiculopathy, lumbar region: Secondary | ICD-10-CM | POA: Diagnosis not present

## 2020-03-09 DIAGNOSIS — H47323 Drusen of optic disc, bilateral: Secondary | ICD-10-CM | POA: Diagnosis not present

## 2020-03-09 DIAGNOSIS — K769 Liver disease, unspecified: Secondary | ICD-10-CM | POA: Diagnosis not present

## 2020-03-09 DIAGNOSIS — I130 Hypertensive heart and chronic kidney disease with heart failure and stage 1 through stage 4 chronic kidney disease, or unspecified chronic kidney disease: Secondary | ICD-10-CM | POA: Diagnosis not present

## 2020-03-09 DIAGNOSIS — E298 Other testicular dysfunction: Secondary | ICD-10-CM | POA: Diagnosis not present

## 2020-03-09 DIAGNOSIS — Z9911 Dependence on respirator [ventilator] status: Secondary | ICD-10-CM | POA: Diagnosis not present

## 2020-03-09 DIAGNOSIS — G43009 Migraine without aura, not intractable, without status migrainosus: Secondary | ICD-10-CM | POA: Diagnosis not present

## 2020-03-09 DIAGNOSIS — L03116 Cellulitis of left lower limb: Secondary | ICD-10-CM | POA: Diagnosis not present

## 2020-03-09 DIAGNOSIS — Z4682 Encounter for fitting and adjustment of non-vascular catheter: Secondary | ICD-10-CM | POA: Diagnosis not present

## 2020-03-09 DIAGNOSIS — I361 Nonrheumatic tricuspid (valve) insufficiency: Secondary | ICD-10-CM | POA: Diagnosis not present

## 2020-03-09 DIAGNOSIS — I445 Left posterior fascicular block: Secondary | ICD-10-CM | POA: Diagnosis not present

## 2020-03-09 DIAGNOSIS — R102 Pelvic and perineal pain: Secondary | ICD-10-CM | POA: Diagnosis not present

## 2020-03-09 DIAGNOSIS — L4050 Arthropathic psoriasis, unspecified: Secondary | ICD-10-CM | POA: Diagnosis not present

## 2020-03-09 DIAGNOSIS — C8330 Diffuse large B-cell lymphoma, unspecified site: Secondary | ICD-10-CM | POA: Diagnosis not present

## 2020-03-09 DIAGNOSIS — S336XXA Sprain of sacroiliac joint, initial encounter: Secondary | ICD-10-CM | POA: Diagnosis not present

## 2020-03-09 DIAGNOSIS — J1282 Pneumonia due to coronavirus disease 2019: Secondary | ICD-10-CM | POA: Diagnosis not present

## 2020-03-09 DIAGNOSIS — R438 Other disturbances of smell and taste: Secondary | ICD-10-CM | POA: Diagnosis not present

## 2020-03-09 DIAGNOSIS — D122 Benign neoplasm of ascending colon: Secondary | ICD-10-CM | POA: Diagnosis not present

## 2020-03-09 DIAGNOSIS — R1031 Right lower quadrant pain: Secondary | ICD-10-CM | POA: Diagnosis not present

## 2020-03-09 DIAGNOSIS — R944 Abnormal results of kidney function studies: Secondary | ICD-10-CM | POA: Diagnosis not present

## 2020-03-09 DIAGNOSIS — R7303 Prediabetes: Secondary | ICD-10-CM | POA: Diagnosis not present

## 2020-03-09 DIAGNOSIS — G43011 Migraine without aura, intractable, with status migrainosus: Secondary | ICD-10-CM | POA: Diagnosis not present

## 2020-03-09 DIAGNOSIS — R490 Dysphonia: Secondary | ICD-10-CM | POA: Diagnosis not present

## 2020-03-09 DIAGNOSIS — H401411 Capsular glaucoma with pseudoexfoliation of lens, right eye, mild stage: Secondary | ICD-10-CM | POA: Diagnosis not present

## 2020-03-09 DIAGNOSIS — M19072 Primary osteoarthritis, left ankle and foot: Secondary | ICD-10-CM | POA: Diagnosis not present

## 2020-03-09 DIAGNOSIS — I429 Cardiomyopathy, unspecified: Secondary | ICD-10-CM | POA: Diagnosis not present

## 2020-03-09 DIAGNOSIS — E46 Unspecified protein-calorie malnutrition: Secondary | ICD-10-CM | POA: Diagnosis not present

## 2020-03-09 DIAGNOSIS — C187 Malignant neoplasm of sigmoid colon: Secondary | ICD-10-CM | POA: Diagnosis not present

## 2020-03-09 DIAGNOSIS — R1909 Other intra-abdominal and pelvic swelling, mass and lump: Secondary | ICD-10-CM | POA: Diagnosis not present

## 2020-03-09 DIAGNOSIS — N429 Disorder of prostate, unspecified: Secondary | ICD-10-CM | POA: Diagnosis not present

## 2020-03-09 DIAGNOSIS — J45909 Unspecified asthma, uncomplicated: Secondary | ICD-10-CM | POA: Diagnosis not present

## 2020-03-09 DIAGNOSIS — C541 Malignant neoplasm of endometrium: Secondary | ICD-10-CM | POA: Diagnosis not present

## 2020-03-09 DIAGNOSIS — M1711 Unilateral primary osteoarthritis, right knee: Secondary | ICD-10-CM | POA: Diagnosis not present

## 2020-03-09 DIAGNOSIS — Z7952 Long term (current) use of systemic steroids: Secondary | ICD-10-CM | POA: Diagnosis not present

## 2020-03-09 DIAGNOSIS — M4316 Spondylolisthesis, lumbar region: Secondary | ICD-10-CM | POA: Diagnosis not present

## 2020-03-09 DIAGNOSIS — S3992XA Unspecified injury of lower back, initial encounter: Secondary | ICD-10-CM | POA: Diagnosis not present

## 2020-03-09 DIAGNOSIS — J4540 Moderate persistent asthma, uncomplicated: Secondary | ICD-10-CM | POA: Diagnosis not present

## 2020-03-09 DIAGNOSIS — H43812 Vitreous degeneration, left eye: Secondary | ICD-10-CM | POA: Diagnosis not present

## 2020-03-09 DIAGNOSIS — M2560 Stiffness of unspecified joint, not elsewhere classified: Secondary | ICD-10-CM | POA: Diagnosis not present

## 2020-03-09 DIAGNOSIS — M25622 Stiffness of left elbow, not elsewhere classified: Secondary | ICD-10-CM | POA: Diagnosis not present

## 2020-03-09 DIAGNOSIS — M792 Neuralgia and neuritis, unspecified: Secondary | ICD-10-CM | POA: Diagnosis not present

## 2020-03-09 DIAGNOSIS — E10649 Type 1 diabetes mellitus with hypoglycemia without coma: Secondary | ICD-10-CM | POA: Diagnosis not present

## 2020-03-09 DIAGNOSIS — L408 Other psoriasis: Secondary | ICD-10-CM | POA: Diagnosis not present

## 2020-03-09 DIAGNOSIS — F331 Major depressive disorder, recurrent, moderate: Secondary | ICD-10-CM | POA: Diagnosis not present

## 2020-03-09 DIAGNOSIS — K580 Irritable bowel syndrome with diarrhea: Secondary | ICD-10-CM | POA: Diagnosis not present

## 2020-03-09 DIAGNOSIS — K5730 Diverticulosis of large intestine without perforation or abscess without bleeding: Secondary | ICD-10-CM | POA: Diagnosis not present

## 2020-03-09 DIAGNOSIS — I509 Heart failure, unspecified: Secondary | ICD-10-CM | POA: Diagnosis not present

## 2020-03-09 DIAGNOSIS — L97511 Non-pressure chronic ulcer of other part of right foot limited to breakdown of skin: Secondary | ICD-10-CM | POA: Diagnosis not present

## 2020-03-09 DIAGNOSIS — N1832 Chronic kidney disease, stage 3b: Secondary | ICD-10-CM | POA: Diagnosis not present

## 2020-03-09 DIAGNOSIS — K869 Disease of pancreas, unspecified: Secondary | ICD-10-CM | POA: Diagnosis not present

## 2020-03-09 DIAGNOSIS — M542 Cervicalgia: Secondary | ICD-10-CM | POA: Diagnosis not present

## 2020-03-09 DIAGNOSIS — Z8579 Personal history of other malignant neoplasms of lymphoid, hematopoietic and related tissues: Secondary | ICD-10-CM | POA: Diagnosis not present

## 2020-03-09 DIAGNOSIS — L603 Nail dystrophy: Secondary | ICD-10-CM | POA: Diagnosis not present

## 2020-03-09 DIAGNOSIS — F319 Bipolar disorder, unspecified: Secondary | ICD-10-CM | POA: Diagnosis not present

## 2020-03-09 DIAGNOSIS — Z5112 Encounter for antineoplastic immunotherapy: Secondary | ICD-10-CM | POA: Diagnosis not present

## 2020-03-09 DIAGNOSIS — D1801 Hemangioma of skin and subcutaneous tissue: Secondary | ICD-10-CM | POA: Diagnosis not present

## 2020-03-09 DIAGNOSIS — D72829 Elevated white blood cell count, unspecified: Secondary | ICD-10-CM | POA: Diagnosis not present

## 2020-03-09 DIAGNOSIS — C44622 Squamous cell carcinoma of skin of right upper limb, including shoulder: Secondary | ICD-10-CM | POA: Diagnosis not present

## 2020-03-09 DIAGNOSIS — G43909 Migraine, unspecified, not intractable, without status migrainosus: Secondary | ICD-10-CM | POA: Diagnosis not present

## 2020-03-09 DIAGNOSIS — N179 Acute kidney failure, unspecified: Secondary | ICD-10-CM | POA: Diagnosis not present

## 2020-03-09 DIAGNOSIS — S0990XA Unspecified injury of head, initial encounter: Secondary | ICD-10-CM | POA: Diagnosis not present

## 2020-03-09 DIAGNOSIS — Z96642 Presence of left artificial hip joint: Secondary | ICD-10-CM | POA: Diagnosis not present

## 2020-03-09 DIAGNOSIS — K862 Cyst of pancreas: Secondary | ICD-10-CM | POA: Diagnosis not present

## 2020-03-09 DIAGNOSIS — L853 Xerosis cutis: Secondary | ICD-10-CM | POA: Diagnosis not present

## 2020-03-09 DIAGNOSIS — K529 Noninfective gastroenteritis and colitis, unspecified: Secondary | ICD-10-CM | POA: Diagnosis not present

## 2020-03-09 DIAGNOSIS — Z8781 Personal history of (healed) traumatic fracture: Secondary | ICD-10-CM | POA: Diagnosis not present

## 2020-03-09 DIAGNOSIS — R61 Generalized hyperhidrosis: Secondary | ICD-10-CM | POA: Diagnosis not present

## 2020-03-09 DIAGNOSIS — M25421 Effusion, right elbow: Secondary | ICD-10-CM | POA: Diagnosis not present

## 2020-03-09 DIAGNOSIS — B0223 Postherpetic polyneuropathy: Secondary | ICD-10-CM | POA: Diagnosis not present

## 2020-03-09 DIAGNOSIS — Z7984 Long term (current) use of oral hypoglycemic drugs: Secondary | ICD-10-CM | POA: Diagnosis not present

## 2020-03-09 DIAGNOSIS — H60332 Swimmer's ear, left ear: Secondary | ICD-10-CM | POA: Diagnosis not present

## 2020-03-09 DIAGNOSIS — G4733 Obstructive sleep apnea (adult) (pediatric): Secondary | ICD-10-CM | POA: Diagnosis not present

## 2020-03-09 DIAGNOSIS — Z85841 Personal history of malignant neoplasm of brain: Secondary | ICD-10-CM | POA: Diagnosis not present

## 2020-03-09 DIAGNOSIS — R739 Hyperglycemia, unspecified: Secondary | ICD-10-CM | POA: Diagnosis not present

## 2020-03-09 DIAGNOSIS — H353221 Exudative age-related macular degeneration, left eye, with active choroidal neovascularization: Secondary | ICD-10-CM | POA: Diagnosis not present

## 2020-03-09 DIAGNOSIS — M205X2 Other deformities of toe(s) (acquired), left foot: Secondary | ICD-10-CM | POA: Diagnosis not present

## 2020-03-09 DIAGNOSIS — M7060 Trochanteric bursitis, unspecified hip: Secondary | ICD-10-CM | POA: Diagnosis not present

## 2020-03-09 DIAGNOSIS — S338XXA Sprain of other parts of lumbar spine and pelvis, initial encounter: Secondary | ICD-10-CM | POA: Diagnosis not present

## 2020-03-09 DIAGNOSIS — R1310 Dysphagia, unspecified: Secondary | ICD-10-CM | POA: Diagnosis not present

## 2020-03-09 DIAGNOSIS — C61 Malignant neoplasm of prostate: Secondary | ICD-10-CM | POA: Diagnosis not present

## 2020-03-09 DIAGNOSIS — C4491 Basal cell carcinoma of skin, unspecified: Secondary | ICD-10-CM | POA: Diagnosis not present

## 2020-03-09 DIAGNOSIS — H16229 Keratoconjunctivitis sicca, not specified as Sjogren's, unspecified eye: Secondary | ICD-10-CM | POA: Diagnosis not present

## 2020-03-09 DIAGNOSIS — H40033 Anatomical narrow angle, bilateral: Secondary | ICD-10-CM | POA: Diagnosis not present

## 2020-03-09 DIAGNOSIS — H18413 Arcus senilis, bilateral: Secondary | ICD-10-CM | POA: Diagnosis not present

## 2020-03-09 DIAGNOSIS — L255 Unspecified contact dermatitis due to plants, except food: Secondary | ICD-10-CM | POA: Diagnosis not present

## 2020-03-09 DIAGNOSIS — M19049 Primary osteoarthritis, unspecified hand: Secondary | ICD-10-CM | POA: Diagnosis not present

## 2020-03-09 DIAGNOSIS — G459 Transient cerebral ischemic attack, unspecified: Secondary | ICD-10-CM | POA: Diagnosis not present

## 2020-03-09 DIAGNOSIS — K5901 Slow transit constipation: Secondary | ICD-10-CM | POA: Diagnosis not present

## 2020-03-09 DIAGNOSIS — M2550 Pain in unspecified joint: Secondary | ICD-10-CM | POA: Diagnosis not present

## 2020-03-09 DIAGNOSIS — R0683 Snoring: Secondary | ICD-10-CM | POA: Diagnosis not present

## 2020-03-09 DIAGNOSIS — H6123 Impacted cerumen, bilateral: Secondary | ICD-10-CM | POA: Diagnosis not present

## 2020-03-09 DIAGNOSIS — M150 Primary generalized (osteo)arthritis: Secondary | ICD-10-CM | POA: Diagnosis not present

## 2020-03-09 DIAGNOSIS — Z1389 Encounter for screening for other disorder: Secondary | ICD-10-CM | POA: Diagnosis not present

## 2020-03-09 DIAGNOSIS — E113293 Type 2 diabetes mellitus with mild nonproliferative diabetic retinopathy without macular edema, bilateral: Secondary | ICD-10-CM | POA: Diagnosis not present

## 2020-03-09 DIAGNOSIS — E1159 Type 2 diabetes mellitus with other circulatory complications: Secondary | ICD-10-CM | POA: Diagnosis not present

## 2020-03-09 DIAGNOSIS — U071 COVID-19: Secondary | ICD-10-CM | POA: Diagnosis not present

## 2020-03-09 DIAGNOSIS — R634 Abnormal weight loss: Secondary | ICD-10-CM | POA: Diagnosis not present

## 2020-03-09 DIAGNOSIS — L97529 Non-pressure chronic ulcer of other part of left foot with unspecified severity: Secondary | ICD-10-CM | POA: Diagnosis not present

## 2020-03-09 DIAGNOSIS — H353122 Nonexudative age-related macular degeneration, left eye, intermediate dry stage: Secondary | ICD-10-CM | POA: Diagnosis not present

## 2020-03-09 DIAGNOSIS — Z794 Long term (current) use of insulin: Secondary | ICD-10-CM | POA: Diagnosis not present

## 2020-03-09 DIAGNOSIS — R05 Cough: Secondary | ICD-10-CM | POA: Diagnosis not present

## 2020-03-09 DIAGNOSIS — I252 Old myocardial infarction: Secondary | ICD-10-CM | POA: Diagnosis not present

## 2020-03-09 DIAGNOSIS — M533 Sacrococcygeal disorders, not elsewhere classified: Secondary | ICD-10-CM | POA: Diagnosis not present

## 2020-03-09 DIAGNOSIS — I5021 Acute systolic (congestive) heart failure: Secondary | ICD-10-CM | POA: Diagnosis not present

## 2020-03-09 DIAGNOSIS — M79651 Pain in right thigh: Secondary | ICD-10-CM | POA: Diagnosis not present

## 2020-03-09 DIAGNOSIS — H26493 Other secondary cataract, bilateral: Secondary | ICD-10-CM | POA: Diagnosis not present

## 2020-03-09 DIAGNOSIS — R519 Headache, unspecified: Secondary | ICD-10-CM | POA: Diagnosis not present

## 2020-03-09 DIAGNOSIS — J0140 Acute pansinusitis, unspecified: Secondary | ICD-10-CM | POA: Diagnosis not present

## 2020-03-09 DIAGNOSIS — Z823 Family history of stroke: Secondary | ICD-10-CM | POA: Diagnosis not present

## 2020-03-09 DIAGNOSIS — G253 Myoclonus: Secondary | ICD-10-CM | POA: Diagnosis not present

## 2020-03-09 DIAGNOSIS — M868X7 Other osteomyelitis, ankle and foot: Secondary | ICD-10-CM | POA: Diagnosis not present

## 2020-03-09 DIAGNOSIS — Z9842 Cataract extraction status, left eye: Secondary | ICD-10-CM | POA: Diagnosis not present

## 2020-03-09 DIAGNOSIS — M25522 Pain in left elbow: Secondary | ICD-10-CM | POA: Diagnosis not present

## 2020-03-09 DIAGNOSIS — H25812 Combined forms of age-related cataract, left eye: Secondary | ICD-10-CM | POA: Diagnosis not present

## 2020-03-09 DIAGNOSIS — F4310 Post-traumatic stress disorder, unspecified: Secondary | ICD-10-CM | POA: Diagnosis not present

## 2020-03-09 DIAGNOSIS — Z7401 Bed confinement status: Secondary | ICD-10-CM | POA: Diagnosis not present

## 2020-03-09 DIAGNOSIS — R55 Syncope and collapse: Secondary | ICD-10-CM | POA: Diagnosis not present

## 2020-03-09 DIAGNOSIS — F332 Major depressive disorder, recurrent severe without psychotic features: Secondary | ICD-10-CM | POA: Diagnosis not present

## 2020-03-09 DIAGNOSIS — J410 Simple chronic bronchitis: Secondary | ICD-10-CM | POA: Diagnosis not present

## 2020-03-09 DIAGNOSIS — R293 Abnormal posture: Secondary | ICD-10-CM | POA: Diagnosis not present

## 2020-03-09 DIAGNOSIS — Z85528 Personal history of other malignant neoplasm of kidney: Secondary | ICD-10-CM | POA: Diagnosis not present

## 2020-03-09 DIAGNOSIS — K56600 Partial intestinal obstruction, unspecified as to cause: Secondary | ICD-10-CM | POA: Diagnosis not present

## 2020-03-09 DIAGNOSIS — Z880 Allergy status to penicillin: Secondary | ICD-10-CM | POA: Diagnosis not present

## 2020-03-09 DIAGNOSIS — I493 Ventricular premature depolarization: Secondary | ICD-10-CM | POA: Diagnosis not present

## 2020-03-09 DIAGNOSIS — H402234 Chronic angle-closure glaucoma, bilateral, indeterminate stage: Secondary | ICD-10-CM | POA: Diagnosis not present

## 2020-03-09 DIAGNOSIS — Z981 Arthrodesis status: Secondary | ICD-10-CM | POA: Diagnosis not present

## 2020-03-09 DIAGNOSIS — M2010 Hallux valgus (acquired), unspecified foot: Secondary | ICD-10-CM | POA: Diagnosis not present

## 2020-03-09 DIAGNOSIS — N3946 Mixed incontinence: Secondary | ICD-10-CM | POA: Diagnosis not present

## 2020-03-09 DIAGNOSIS — M25541 Pain in joints of right hand: Secondary | ICD-10-CM | POA: Diagnosis not present

## 2020-03-09 DIAGNOSIS — S46812A Strain of other muscles, fascia and tendons at shoulder and upper arm level, left arm, initial encounter: Secondary | ICD-10-CM | POA: Diagnosis not present

## 2020-03-09 DIAGNOSIS — Z8639 Personal history of other endocrine, nutritional and metabolic disease: Secondary | ICD-10-CM | POA: Diagnosis not present

## 2020-03-09 DIAGNOSIS — Z86010 Personal history of colonic polyps: Secondary | ICD-10-CM | POA: Diagnosis not present

## 2020-03-09 DIAGNOSIS — F5222 Female sexual arousal disorder: Secondary | ICD-10-CM | POA: Diagnosis not present

## 2020-03-09 DIAGNOSIS — D2239 Melanocytic nevi of other parts of face: Secondary | ICD-10-CM | POA: Diagnosis not present

## 2020-03-09 DIAGNOSIS — Z48810 Encounter for surgical aftercare following surgery on the sense organs: Secondary | ICD-10-CM | POA: Diagnosis not present

## 2020-03-09 DIAGNOSIS — M9902 Segmental and somatic dysfunction of thoracic region: Secondary | ICD-10-CM | POA: Diagnosis not present

## 2020-03-09 DIAGNOSIS — M79672 Pain in left foot: Secondary | ICD-10-CM | POA: Diagnosis not present

## 2020-03-09 DIAGNOSIS — D649 Anemia, unspecified: Secondary | ICD-10-CM | POA: Diagnosis not present

## 2020-03-09 DIAGNOSIS — C44311 Basal cell carcinoma of skin of nose: Secondary | ICD-10-CM | POA: Diagnosis not present

## 2020-03-09 DIAGNOSIS — H9313 Tinnitus, bilateral: Secondary | ICD-10-CM | POA: Diagnosis not present

## 2020-03-09 DIAGNOSIS — F1290 Cannabis use, unspecified, uncomplicated: Secondary | ICD-10-CM | POA: Diagnosis not present

## 2020-03-09 DIAGNOSIS — F411 Generalized anxiety disorder: Secondary | ICD-10-CM | POA: Diagnosis not present

## 2020-03-09 DIAGNOSIS — K5900 Constipation, unspecified: Secondary | ICD-10-CM | POA: Diagnosis not present

## 2020-03-09 DIAGNOSIS — E876 Hypokalemia: Secondary | ICD-10-CM | POA: Diagnosis not present

## 2020-03-09 DIAGNOSIS — I4821 Permanent atrial fibrillation: Secondary | ICD-10-CM | POA: Diagnosis not present

## 2020-03-09 DIAGNOSIS — C44722 Squamous cell carcinoma of skin of right lower limb, including hip: Secondary | ICD-10-CM | POA: Diagnosis not present

## 2020-03-09 DIAGNOSIS — G893 Neoplasm related pain (acute) (chronic): Secondary | ICD-10-CM | POA: Diagnosis not present

## 2020-03-09 DIAGNOSIS — E7801 Familial hypercholesterolemia: Secondary | ICD-10-CM | POA: Diagnosis not present

## 2020-03-09 DIAGNOSIS — Z8673 Personal history of transient ischemic attack (TIA), and cerebral infarction without residual deficits: Secondary | ICD-10-CM | POA: Diagnosis not present

## 2020-03-09 DIAGNOSIS — M7918 Myalgia, other site: Secondary | ICD-10-CM | POA: Diagnosis not present

## 2020-03-09 DIAGNOSIS — Z6827 Body mass index (BMI) 27.0-27.9, adult: Secondary | ICD-10-CM | POA: Diagnosis not present

## 2020-03-09 DIAGNOSIS — R399 Unspecified symptoms and signs involving the genitourinary system: Secondary | ICD-10-CM | POA: Diagnosis not present

## 2020-03-09 DIAGNOSIS — M25611 Stiffness of right shoulder, not elsewhere classified: Secondary | ICD-10-CM | POA: Diagnosis not present

## 2020-03-09 DIAGNOSIS — Z6841 Body Mass Index (BMI) 40.0 and over, adult: Secondary | ICD-10-CM | POA: Diagnosis not present

## 2020-03-09 DIAGNOSIS — Z9119 Patient's noncompliance with other medical treatment and regimen: Secondary | ICD-10-CM | POA: Diagnosis not present

## 2020-03-09 DIAGNOSIS — R2681 Unsteadiness on feet: Secondary | ICD-10-CM | POA: Diagnosis not present

## 2020-03-09 DIAGNOSIS — E785 Hyperlipidemia, unspecified: Secondary | ICD-10-CM | POA: Diagnosis not present

## 2020-03-09 DIAGNOSIS — K51019 Ulcerative (chronic) pancolitis with unspecified complications: Secondary | ICD-10-CM | POA: Diagnosis not present

## 2020-03-09 DIAGNOSIS — H04221 Epiphora due to insufficient drainage, right lacrimal gland: Secondary | ICD-10-CM | POA: Diagnosis not present

## 2020-03-09 DIAGNOSIS — N400 Enlarged prostate without lower urinary tract symptoms: Secondary | ICD-10-CM | POA: Diagnosis not present

## 2020-03-09 DIAGNOSIS — F3181 Bipolar II disorder: Secondary | ICD-10-CM | POA: Diagnosis not present

## 2020-03-09 DIAGNOSIS — M86672 Other chronic osteomyelitis, left ankle and foot: Secondary | ICD-10-CM | POA: Diagnosis not present

## 2020-03-09 DIAGNOSIS — G6289 Other specified polyneuropathies: Secondary | ICD-10-CM | POA: Diagnosis not present

## 2020-03-09 DIAGNOSIS — H3589 Other specified retinal disorders: Secondary | ICD-10-CM | POA: Diagnosis not present

## 2020-03-09 DIAGNOSIS — F329 Major depressive disorder, single episode, unspecified: Secondary | ICD-10-CM | POA: Diagnosis not present

## 2020-03-09 DIAGNOSIS — F17210 Nicotine dependence, cigarettes, uncomplicated: Secondary | ICD-10-CM | POA: Diagnosis not present

## 2020-03-09 DIAGNOSIS — W64XXXA Exposure to other animate mechanical forces, initial encounter: Secondary | ICD-10-CM | POA: Diagnosis not present

## 2020-03-09 DIAGNOSIS — E2839 Other primary ovarian failure: Secondary | ICD-10-CM | POA: Diagnosis not present

## 2020-03-09 DIAGNOSIS — D2262 Melanocytic nevi of left upper limb, including shoulder: Secondary | ICD-10-CM | POA: Diagnosis not present

## 2020-03-09 DIAGNOSIS — Z48813 Encounter for surgical aftercare following surgery on the respiratory system: Secondary | ICD-10-CM | POA: Diagnosis not present

## 2020-03-09 DIAGNOSIS — S40021A Contusion of right upper arm, initial encounter: Secondary | ICD-10-CM | POA: Diagnosis not present

## 2020-03-09 DIAGNOSIS — Z01818 Encounter for other preprocedural examination: Secondary | ICD-10-CM | POA: Diagnosis not present

## 2020-03-09 DIAGNOSIS — M21372 Foot drop, left foot: Secondary | ICD-10-CM | POA: Diagnosis not present

## 2020-03-09 DIAGNOSIS — K5140 Inflammatory polyps of colon without complications: Secondary | ICD-10-CM | POA: Diagnosis not present

## 2020-03-09 DIAGNOSIS — C50412 Malignant neoplasm of upper-outer quadrant of left female breast: Secondary | ICD-10-CM | POA: Diagnosis not present

## 2020-03-09 DIAGNOSIS — S93401A Sprain of unspecified ligament of right ankle, initial encounter: Secondary | ICD-10-CM | POA: Diagnosis not present

## 2020-03-09 DIAGNOSIS — C50111 Malignant neoplasm of central portion of right female breast: Secondary | ICD-10-CM | POA: Diagnosis not present

## 2020-03-09 DIAGNOSIS — H353112 Nonexudative age-related macular degeneration, right eye, intermediate dry stage: Secondary | ICD-10-CM | POA: Diagnosis not present

## 2020-03-09 DIAGNOSIS — C44319 Basal cell carcinoma of skin of other parts of face: Secondary | ICD-10-CM | POA: Diagnosis not present

## 2020-03-09 DIAGNOSIS — D442 Neoplasm of uncertain behavior of parathyroid gland: Secondary | ICD-10-CM | POA: Diagnosis not present

## 2020-03-09 DIAGNOSIS — D519 Vitamin B12 deficiency anemia, unspecified: Secondary | ICD-10-CM | POA: Diagnosis not present

## 2020-03-09 DIAGNOSIS — J328 Other chronic sinusitis: Secondary | ICD-10-CM | POA: Diagnosis not present

## 2020-03-09 DIAGNOSIS — H3500 Unspecified background retinopathy: Secondary | ICD-10-CM | POA: Diagnosis not present

## 2020-03-09 DIAGNOSIS — H401423 Capsular glaucoma with pseudoexfoliation of lens, left eye, severe stage: Secondary | ICD-10-CM | POA: Diagnosis not present

## 2020-03-09 DIAGNOSIS — M26622 Arthralgia of left temporomandibular joint: Secondary | ICD-10-CM | POA: Diagnosis not present

## 2020-03-09 DIAGNOSIS — G500 Trigeminal neuralgia: Secondary | ICD-10-CM | POA: Diagnosis not present

## 2020-03-09 DIAGNOSIS — M0630 Rheumatoid nodule, unspecified site: Secondary | ICD-10-CM | POA: Diagnosis not present

## 2020-03-09 DIAGNOSIS — M0609 Rheumatoid arthritis without rheumatoid factor, multiple sites: Secondary | ICD-10-CM | POA: Diagnosis not present

## 2020-03-09 DIAGNOSIS — I5041 Acute combined systolic (congestive) and diastolic (congestive) heart failure: Secondary | ICD-10-CM | POA: Diagnosis not present

## 2020-03-09 DIAGNOSIS — M4716 Other spondylosis with myelopathy, lumbar region: Secondary | ICD-10-CM | POA: Diagnosis not present

## 2020-03-09 DIAGNOSIS — G4701 Insomnia due to medical condition: Secondary | ICD-10-CM | POA: Diagnosis not present

## 2020-03-09 DIAGNOSIS — N6012 Diffuse cystic mastopathy of left breast: Secondary | ICD-10-CM | POA: Diagnosis not present

## 2020-03-09 DIAGNOSIS — K760 Fatty (change of) liver, not elsewhere classified: Secondary | ICD-10-CM | POA: Diagnosis not present

## 2020-03-09 DIAGNOSIS — R931 Abnormal findings on diagnostic imaging of heart and coronary circulation: Secondary | ICD-10-CM | POA: Diagnosis not present

## 2020-03-09 DIAGNOSIS — C44320 Squamous cell carcinoma of skin of unspecified parts of face: Secondary | ICD-10-CM | POA: Diagnosis not present

## 2020-03-09 DIAGNOSIS — N959 Unspecified menopausal and perimenopausal disorder: Secondary | ICD-10-CM | POA: Diagnosis not present

## 2020-03-09 DIAGNOSIS — F339 Major depressive disorder, recurrent, unspecified: Secondary | ICD-10-CM | POA: Diagnosis not present

## 2020-03-09 DIAGNOSIS — R630 Anorexia: Secondary | ICD-10-CM | POA: Diagnosis not present

## 2020-03-09 DIAGNOSIS — H35033 Hypertensive retinopathy, bilateral: Secondary | ICD-10-CM | POA: Diagnosis not present

## 2020-03-09 DIAGNOSIS — M859 Disorder of bone density and structure, unspecified: Secondary | ICD-10-CM | POA: Diagnosis not present

## 2020-03-09 DIAGNOSIS — M1712 Unilateral primary osteoarthritis, left knee: Secondary | ICD-10-CM | POA: Diagnosis not present

## 2020-03-09 DIAGNOSIS — H2511 Age-related nuclear cataract, right eye: Secondary | ICD-10-CM | POA: Diagnosis not present

## 2020-03-09 DIAGNOSIS — Z833 Family history of diabetes mellitus: Secondary | ICD-10-CM | POA: Diagnosis not present

## 2020-03-09 DIAGNOSIS — Z9689 Presence of other specified functional implants: Secondary | ICD-10-CM | POA: Diagnosis not present

## 2020-03-09 DIAGNOSIS — S81812A Laceration without foreign body, left lower leg, initial encounter: Secondary | ICD-10-CM | POA: Diagnosis not present

## 2020-03-09 DIAGNOSIS — D2361 Other benign neoplasm of skin of right upper limb, including shoulder: Secondary | ICD-10-CM | POA: Diagnosis not present

## 2020-03-09 DIAGNOSIS — I10 Essential (primary) hypertension: Secondary | ICD-10-CM | POA: Diagnosis not present

## 2020-03-09 DIAGNOSIS — M62551 Muscle wasting and atrophy, not elsewhere classified, right thigh: Secondary | ICD-10-CM | POA: Diagnosis not present

## 2020-03-09 DIAGNOSIS — H02889 Meibomian gland dysfunction of unspecified eye, unspecified eyelid: Secondary | ICD-10-CM | POA: Diagnosis not present

## 2020-03-09 DIAGNOSIS — N182 Chronic kidney disease, stage 2 (mild): Secondary | ICD-10-CM | POA: Diagnosis not present

## 2020-03-09 DIAGNOSIS — R0600 Dyspnea, unspecified: Secondary | ICD-10-CM | POA: Diagnosis not present

## 2020-03-09 DIAGNOSIS — K644 Residual hemorrhoidal skin tags: Secondary | ICD-10-CM | POA: Diagnosis not present

## 2020-03-09 DIAGNOSIS — R600 Localized edema: Secondary | ICD-10-CM | POA: Diagnosis not present

## 2020-03-09 DIAGNOSIS — R82998 Other abnormal findings in urine: Secondary | ICD-10-CM | POA: Diagnosis not present

## 2020-03-09 DIAGNOSIS — K581 Irritable bowel syndrome with constipation: Secondary | ICD-10-CM | POA: Diagnosis not present

## 2020-03-09 DIAGNOSIS — K1379 Other lesions of oral mucosa: Secondary | ICD-10-CM | POA: Diagnosis not present

## 2020-03-09 DIAGNOSIS — Z955 Presence of coronary angioplasty implant and graft: Secondary | ICD-10-CM | POA: Diagnosis not present

## 2020-03-09 DIAGNOSIS — H401131 Primary open-angle glaucoma, bilateral, mild stage: Secondary | ICD-10-CM | POA: Diagnosis not present

## 2020-03-09 DIAGNOSIS — R296 Repeated falls: Secondary | ICD-10-CM | POA: Diagnosis not present

## 2020-03-09 DIAGNOSIS — E790 Hyperuricemia without signs of inflammatory arthritis and tophaceous disease: Secondary | ICD-10-CM | POA: Diagnosis not present

## 2020-03-09 DIAGNOSIS — Z131 Encounter for screening for diabetes mellitus: Secondary | ICD-10-CM | POA: Diagnosis not present

## 2020-03-09 DIAGNOSIS — M4854XA Collapsed vertebra, not elsewhere classified, thoracic region, initial encounter for fracture: Secondary | ICD-10-CM | POA: Diagnosis not present

## 2020-03-09 DIAGNOSIS — N6311 Unspecified lump in the right breast, upper outer quadrant: Secondary | ICD-10-CM | POA: Diagnosis not present

## 2020-03-09 DIAGNOSIS — Z951 Presence of aortocoronary bypass graft: Secondary | ICD-10-CM | POA: Diagnosis not present

## 2020-03-09 DIAGNOSIS — K229 Disease of esophagus, unspecified: Secondary | ICD-10-CM | POA: Diagnosis not present

## 2020-03-09 DIAGNOSIS — Z8616 Personal history of COVID-19: Secondary | ICD-10-CM | POA: Diagnosis not present

## 2020-03-09 DIAGNOSIS — E039 Hypothyroidism, unspecified: Secondary | ICD-10-CM | POA: Diagnosis not present

## 2020-03-09 DIAGNOSIS — H40013 Open angle with borderline findings, low risk, bilateral: Secondary | ICD-10-CM | POA: Diagnosis not present

## 2020-03-09 DIAGNOSIS — M25641 Stiffness of right hand, not elsewhere classified: Secondary | ICD-10-CM | POA: Diagnosis not present

## 2020-03-09 DIAGNOSIS — Z885 Allergy status to narcotic agent status: Secondary | ICD-10-CM | POA: Diagnosis not present

## 2020-03-09 DIAGNOSIS — E211 Secondary hyperparathyroidism, not elsewhere classified: Secondary | ICD-10-CM | POA: Diagnosis not present

## 2020-03-09 DIAGNOSIS — L820 Inflamed seborrheic keratosis: Secondary | ICD-10-CM | POA: Diagnosis not present

## 2020-03-09 DIAGNOSIS — C3411 Malignant neoplasm of upper lobe, right bronchus or lung: Secondary | ICD-10-CM | POA: Diagnosis not present

## 2020-03-09 DIAGNOSIS — I209 Angina pectoris, unspecified: Secondary | ICD-10-CM | POA: Diagnosis not present

## 2020-03-09 DIAGNOSIS — G43709 Chronic migraine without aura, not intractable, without status migrainosus: Secondary | ICD-10-CM | POA: Diagnosis not present

## 2020-03-09 DIAGNOSIS — M25612 Stiffness of left shoulder, not elsewhere classified: Secondary | ICD-10-CM | POA: Diagnosis not present

## 2020-03-09 DIAGNOSIS — C8298 Follicular lymphoma, unspecified, lymph nodes of multiple sites: Secondary | ICD-10-CM | POA: Diagnosis not present

## 2020-03-09 DIAGNOSIS — M79645 Pain in left finger(s): Secondary | ICD-10-CM | POA: Diagnosis not present

## 2020-03-09 DIAGNOSIS — K5010 Crohn's disease of large intestine without complications: Secondary | ICD-10-CM | POA: Diagnosis not present

## 2020-03-09 DIAGNOSIS — K922 Gastrointestinal hemorrhage, unspecified: Secondary | ICD-10-CM | POA: Diagnosis not present

## 2020-03-09 DIAGNOSIS — G629 Polyneuropathy, unspecified: Secondary | ICD-10-CM | POA: Diagnosis not present

## 2020-03-09 DIAGNOSIS — L814 Other melanin hyperpigmentation: Secondary | ICD-10-CM | POA: Diagnosis not present

## 2020-03-09 DIAGNOSIS — F310 Bipolar disorder, current episode hypomanic: Secondary | ICD-10-CM | POA: Diagnosis not present

## 2020-03-09 DIAGNOSIS — L0390 Cellulitis, unspecified: Secondary | ICD-10-CM | POA: Diagnosis not present

## 2020-03-09 DIAGNOSIS — H40023 Open angle with borderline findings, high risk, bilateral: Secondary | ICD-10-CM | POA: Diagnosis not present

## 2020-03-09 DIAGNOSIS — I442 Atrioventricular block, complete: Secondary | ICD-10-CM | POA: Diagnosis not present

## 2020-03-09 DIAGNOSIS — N2581 Secondary hyperparathyroidism of renal origin: Secondary | ICD-10-CM | POA: Diagnosis not present

## 2020-03-09 DIAGNOSIS — H04123 Dry eye syndrome of bilateral lacrimal glands: Secondary | ICD-10-CM | POA: Diagnosis not present

## 2020-03-09 DIAGNOSIS — H52203 Unspecified astigmatism, bilateral: Secondary | ICD-10-CM | POA: Diagnosis not present

## 2020-03-09 DIAGNOSIS — M1991 Primary osteoarthritis, unspecified site: Secondary | ICD-10-CM | POA: Diagnosis not present

## 2020-03-09 DIAGNOSIS — G4730 Sleep apnea, unspecified: Secondary | ICD-10-CM | POA: Diagnosis not present

## 2020-03-09 DIAGNOSIS — Z5189 Encounter for other specified aftercare: Secondary | ICD-10-CM | POA: Diagnosis not present

## 2020-03-09 DIAGNOSIS — M47816 Spondylosis without myelopathy or radiculopathy, lumbar region: Secondary | ICD-10-CM | POA: Diagnosis not present

## 2020-03-09 DIAGNOSIS — D638 Anemia in other chronic diseases classified elsewhere: Secondary | ICD-10-CM | POA: Diagnosis not present

## 2020-03-09 DIAGNOSIS — Z79891 Long term (current) use of opiate analgesic: Secondary | ICD-10-CM | POA: Diagnosis not present

## 2020-03-09 DIAGNOSIS — N131 Hydronephrosis with ureteral stricture, not elsewhere classified: Secondary | ICD-10-CM | POA: Diagnosis not present

## 2020-03-09 DIAGNOSIS — I8500 Esophageal varices without bleeding: Secondary | ICD-10-CM | POA: Diagnosis not present

## 2020-03-09 DIAGNOSIS — R921 Mammographic calcification found on diagnostic imaging of breast: Secondary | ICD-10-CM | POA: Diagnosis not present

## 2020-03-09 DIAGNOSIS — M549 Dorsalgia, unspecified: Secondary | ICD-10-CM | POA: Diagnosis not present

## 2020-03-09 DIAGNOSIS — H04552 Acquired stenosis of left nasolacrimal duct: Secondary | ICD-10-CM | POA: Diagnosis not present

## 2020-03-09 DIAGNOSIS — L409 Psoriasis, unspecified: Secondary | ICD-10-CM | POA: Diagnosis not present

## 2020-03-09 DIAGNOSIS — D126 Benign neoplasm of colon, unspecified: Secondary | ICD-10-CM | POA: Diagnosis not present

## 2020-03-09 DIAGNOSIS — J9809 Other diseases of bronchus, not elsewhere classified: Secondary | ICD-10-CM | POA: Diagnosis not present

## 2020-03-09 DIAGNOSIS — E0822 Diabetes mellitus due to underlying condition with diabetic chronic kidney disease: Secondary | ICD-10-CM | POA: Diagnosis not present

## 2020-03-09 DIAGNOSIS — K2100 Gastro-esophageal reflux disease with esophagitis, without bleeding: Secondary | ICD-10-CM | POA: Diagnosis not present

## 2020-03-09 DIAGNOSIS — G8220 Paraplegia, unspecified: Secondary | ICD-10-CM | POA: Diagnosis not present

## 2020-03-09 DIAGNOSIS — M4807 Spinal stenosis, lumbosacral region: Secondary | ICD-10-CM | POA: Diagnosis not present

## 2020-03-09 DIAGNOSIS — S233XXA Sprain of ligaments of thoracic spine, initial encounter: Secondary | ICD-10-CM | POA: Diagnosis not present

## 2020-03-09 DIAGNOSIS — Z139 Encounter for screening, unspecified: Secondary | ICD-10-CM | POA: Diagnosis not present

## 2020-03-09 DIAGNOSIS — M21371 Foot drop, right foot: Secondary | ICD-10-CM | POA: Diagnosis not present

## 2020-03-09 DIAGNOSIS — L03031 Cellulitis of right toe: Secondary | ICD-10-CM | POA: Diagnosis not present

## 2020-03-09 DIAGNOSIS — M50322 Other cervical disc degeneration at C5-C6 level: Secondary | ICD-10-CM | POA: Diagnosis not present

## 2020-03-09 DIAGNOSIS — M79676 Pain in unspecified toe(s): Secondary | ICD-10-CM | POA: Diagnosis not present

## 2020-03-09 DIAGNOSIS — Z9841 Cataract extraction status, right eye: Secondary | ICD-10-CM | POA: Diagnosis not present

## 2020-03-09 DIAGNOSIS — M899 Disorder of bone, unspecified: Secondary | ICD-10-CM | POA: Diagnosis not present

## 2020-03-09 DIAGNOSIS — R935 Abnormal findings on diagnostic imaging of other abdominal regions, including retroperitoneum: Secondary | ICD-10-CM | POA: Diagnosis not present

## 2020-03-09 DIAGNOSIS — I81 Portal vein thrombosis: Secondary | ICD-10-CM | POA: Diagnosis not present

## 2020-03-09 DIAGNOSIS — R270 Ataxia, unspecified: Secondary | ICD-10-CM | POA: Diagnosis not present

## 2020-03-09 DIAGNOSIS — R7401 Elevation of levels of liver transaminase levels: Secondary | ICD-10-CM | POA: Diagnosis not present

## 2020-03-09 DIAGNOSIS — N3021 Other chronic cystitis with hematuria: Secondary | ICD-10-CM | POA: Diagnosis not present

## 2020-03-09 DIAGNOSIS — E559 Vitamin D deficiency, unspecified: Secondary | ICD-10-CM | POA: Diagnosis not present

## 2020-03-09 DIAGNOSIS — I341 Nonrheumatic mitral (valve) prolapse: Secondary | ICD-10-CM | POA: Diagnosis not present

## 2020-03-09 DIAGNOSIS — Z6830 Body mass index (BMI) 30.0-30.9, adult: Secondary | ICD-10-CM | POA: Diagnosis not present

## 2020-03-09 DIAGNOSIS — R7309 Other abnormal glucose: Secondary | ICD-10-CM | POA: Diagnosis not present

## 2020-03-09 DIAGNOSIS — F900 Attention-deficit hyperactivity disorder, predominantly inattentive type: Secondary | ICD-10-CM | POA: Diagnosis not present

## 2020-03-09 DIAGNOSIS — F4522 Body dysmorphic disorder: Secondary | ICD-10-CM | POA: Diagnosis not present

## 2020-03-09 DIAGNOSIS — K7460 Unspecified cirrhosis of liver: Secondary | ICD-10-CM | POA: Diagnosis not present

## 2020-03-09 DIAGNOSIS — F5105 Insomnia due to other mental disorder: Secondary | ICD-10-CM | POA: Diagnosis not present

## 2020-03-09 DIAGNOSIS — M272 Inflammatory conditions of jaws: Secondary | ICD-10-CM | POA: Diagnosis not present

## 2020-03-09 DIAGNOSIS — I482 Chronic atrial fibrillation, unspecified: Secondary | ICD-10-CM | POA: Diagnosis not present

## 2020-03-09 DIAGNOSIS — H3563 Retinal hemorrhage, bilateral: Secondary | ICD-10-CM | POA: Diagnosis not present

## 2020-03-09 DIAGNOSIS — E860 Dehydration: Secondary | ICD-10-CM | POA: Diagnosis not present

## 2020-03-09 DIAGNOSIS — G20 Parkinson's disease: Secondary | ICD-10-CM | POA: Diagnosis not present

## 2020-03-09 DIAGNOSIS — R569 Unspecified convulsions: Secondary | ICD-10-CM | POA: Diagnosis not present

## 2020-03-09 DIAGNOSIS — F4323 Adjustment disorder with mixed anxiety and depressed mood: Secondary | ICD-10-CM | POA: Diagnosis not present

## 2020-03-09 DIAGNOSIS — Z9981 Dependence on supplemental oxygen: Secondary | ICD-10-CM | POA: Diagnosis not present

## 2020-03-09 DIAGNOSIS — K222 Esophageal obstruction: Secondary | ICD-10-CM | POA: Diagnosis not present

## 2020-03-09 DIAGNOSIS — J84112 Idiopathic pulmonary fibrosis: Secondary | ICD-10-CM | POA: Diagnosis not present

## 2020-03-09 DIAGNOSIS — E291 Testicular hypofunction: Secondary | ICD-10-CM | POA: Diagnosis not present

## 2020-03-09 DIAGNOSIS — Z85820 Personal history of malignant melanoma of skin: Secondary | ICD-10-CM | POA: Diagnosis not present

## 2020-03-09 DIAGNOSIS — M25461 Effusion, right knee: Secondary | ICD-10-CM | POA: Diagnosis not present

## 2020-03-09 DIAGNOSIS — G47419 Narcolepsy without cataplexy: Secondary | ICD-10-CM | POA: Diagnosis not present

## 2020-03-09 DIAGNOSIS — H348322 Tributary (branch) retinal vein occlusion, left eye, stable: Secondary | ICD-10-CM | POA: Diagnosis not present

## 2020-03-09 DIAGNOSIS — Z982 Presence of cerebrospinal fluid drainage device: Secondary | ICD-10-CM | POA: Diagnosis not present

## 2020-03-09 DIAGNOSIS — N6321 Unspecified lump in the left breast, upper outer quadrant: Secondary | ICD-10-CM | POA: Diagnosis not present

## 2020-03-09 DIAGNOSIS — E041 Nontoxic single thyroid nodule: Secondary | ICD-10-CM | POA: Diagnosis not present

## 2020-03-09 DIAGNOSIS — L308 Other specified dermatitis: Secondary | ICD-10-CM | POA: Diagnosis not present

## 2020-03-09 DIAGNOSIS — F4321 Adjustment disorder with depressed mood: Secondary | ICD-10-CM | POA: Diagnosis not present

## 2020-03-09 DIAGNOSIS — S42202D Unspecified fracture of upper end of left humerus, subsequent encounter for fracture with routine healing: Secondary | ICD-10-CM | POA: Diagnosis not present

## 2020-03-09 DIAGNOSIS — W57XXXA Bitten or stung by nonvenomous insect and other nonvenomous arthropods, initial encounter: Secondary | ICD-10-CM | POA: Diagnosis not present

## 2020-03-09 DIAGNOSIS — K641 Second degree hemorrhoids: Secondary | ICD-10-CM | POA: Diagnosis not present

## 2020-03-09 DIAGNOSIS — S143XXD Injury of brachial plexus, subsequent encounter: Secondary | ICD-10-CM | POA: Diagnosis not present

## 2020-03-09 DIAGNOSIS — C9110 Chronic lymphocytic leukemia of B-cell type not having achieved remission: Secondary | ICD-10-CM | POA: Diagnosis not present

## 2020-03-09 DIAGNOSIS — B0221 Postherpetic geniculate ganglionitis: Secondary | ICD-10-CM | POA: Diagnosis not present

## 2020-03-09 DIAGNOSIS — H05222 Edema of left orbit: Secondary | ICD-10-CM | POA: Diagnosis not present

## 2020-03-09 DIAGNOSIS — G4720 Circadian rhythm sleep disorder, unspecified type: Secondary | ICD-10-CM | POA: Diagnosis not present

## 2020-03-09 DIAGNOSIS — R109 Unspecified abdominal pain: Secondary | ICD-10-CM | POA: Diagnosis not present

## 2020-03-09 DIAGNOSIS — E113311 Type 2 diabetes mellitus with moderate nonproliferative diabetic retinopathy with macular edema, right eye: Secondary | ICD-10-CM | POA: Diagnosis not present

## 2020-03-09 DIAGNOSIS — M25512 Pain in left shoulder: Secondary | ICD-10-CM | POA: Diagnosis not present

## 2020-03-09 DIAGNOSIS — M722 Plantar fascial fibromatosis: Secondary | ICD-10-CM | POA: Diagnosis not present

## 2020-03-09 DIAGNOSIS — I739 Peripheral vascular disease, unspecified: Secondary | ICD-10-CM | POA: Diagnosis not present

## 2020-03-09 DIAGNOSIS — R0781 Pleurodynia: Secondary | ICD-10-CM | POA: Diagnosis not present

## 2020-03-09 DIAGNOSIS — M25572 Pain in left ankle and joints of left foot: Secondary | ICD-10-CM | POA: Diagnosis not present

## 2020-03-09 DIAGNOSIS — M7630 Iliotibial band syndrome, unspecified leg: Secondary | ICD-10-CM | POA: Diagnosis not present

## 2020-03-09 DIAGNOSIS — S81811A Laceration without foreign body, right lower leg, initial encounter: Secondary | ICD-10-CM | POA: Diagnosis not present

## 2020-03-09 DIAGNOSIS — M25521 Pain in right elbow: Secondary | ICD-10-CM | POA: Diagnosis not present

## 2020-03-09 DIAGNOSIS — N261 Atrophy of kidney (terminal): Secondary | ICD-10-CM | POA: Diagnosis not present

## 2020-03-09 DIAGNOSIS — Z940 Kidney transplant status: Secondary | ICD-10-CM | POA: Diagnosis not present

## 2020-03-09 DIAGNOSIS — J9600 Acute respiratory failure, unspecified whether with hypoxia or hypercapnia: Secondary | ICD-10-CM | POA: Diagnosis not present

## 2020-03-09 DIAGNOSIS — E7800 Pure hypercholesterolemia, unspecified: Secondary | ICD-10-CM | POA: Diagnosis not present

## 2020-03-09 DIAGNOSIS — I70219 Atherosclerosis of native arteries of extremities with intermittent claudication, unspecified extremity: Secondary | ICD-10-CM | POA: Diagnosis not present

## 2020-03-09 DIAGNOSIS — I788 Other diseases of capillaries: Secondary | ICD-10-CM | POA: Diagnosis not present

## 2020-03-09 DIAGNOSIS — R590 Localized enlarged lymph nodes: Secondary | ICD-10-CM | POA: Diagnosis not present

## 2020-03-09 DIAGNOSIS — H25012 Cortical age-related cataract, left eye: Secondary | ICD-10-CM | POA: Diagnosis not present

## 2020-03-09 DIAGNOSIS — H353114 Nonexudative age-related macular degeneration, right eye, advanced atrophic with subfoveal involvement: Secondary | ICD-10-CM | POA: Diagnosis not present

## 2020-03-09 DIAGNOSIS — C7951 Secondary malignant neoplasm of bone: Secondary | ICD-10-CM | POA: Diagnosis not present

## 2020-03-09 DIAGNOSIS — J439 Emphysema, unspecified: Secondary | ICD-10-CM | POA: Diagnosis not present

## 2020-03-09 DIAGNOSIS — I150 Renovascular hypertension: Secondary | ICD-10-CM | POA: Diagnosis not present

## 2020-03-09 DIAGNOSIS — E7849 Other hyperlipidemia: Secondary | ICD-10-CM | POA: Diagnosis not present

## 2020-03-09 DIAGNOSIS — L02611 Cutaneous abscess of right foot: Secondary | ICD-10-CM | POA: Diagnosis not present

## 2020-03-09 DIAGNOSIS — A6920 Lyme disease, unspecified: Secondary | ICD-10-CM | POA: Diagnosis not present

## 2020-03-09 DIAGNOSIS — Z993 Dependence on wheelchair: Secondary | ICD-10-CM | POA: Diagnosis not present

## 2020-03-09 DIAGNOSIS — F5104 Psychophysiologic insomnia: Secondary | ICD-10-CM | POA: Diagnosis not present

## 2020-03-09 DIAGNOSIS — M5412 Radiculopathy, cervical region: Secondary | ICD-10-CM | POA: Diagnosis not present

## 2020-03-09 DIAGNOSIS — M85851 Other specified disorders of bone density and structure, right thigh: Secondary | ICD-10-CM | POA: Diagnosis not present

## 2020-03-09 DIAGNOSIS — M1611 Unilateral primary osteoarthritis, right hip: Secondary | ICD-10-CM | POA: Diagnosis not present

## 2020-03-09 DIAGNOSIS — E113312 Type 2 diabetes mellitus with moderate nonproliferative diabetic retinopathy with macular edema, left eye: Secondary | ICD-10-CM | POA: Diagnosis not present

## 2020-03-09 DIAGNOSIS — Z888 Allergy status to other drugs, medicaments and biological substances status: Secondary | ICD-10-CM | POA: Diagnosis not present

## 2020-03-09 DIAGNOSIS — Z882 Allergy status to sulfonamides status: Secondary | ICD-10-CM | POA: Diagnosis not present

## 2020-03-09 DIAGNOSIS — K5790 Diverticulosis of intestine, part unspecified, without perforation or abscess without bleeding: Secondary | ICD-10-CM | POA: Diagnosis not present

## 2020-03-09 DIAGNOSIS — Z791 Long term (current) use of non-steroidal anti-inflammatories (NSAID): Secondary | ICD-10-CM | POA: Diagnosis not present

## 2020-03-09 DIAGNOSIS — I240 Acute coronary thrombosis not resulting in myocardial infarction: Secondary | ICD-10-CM | POA: Diagnosis not present

## 2020-03-09 DIAGNOSIS — R072 Precordial pain: Secondary | ICD-10-CM | POA: Diagnosis not present

## 2020-03-09 DIAGNOSIS — R87612 Low grade squamous intraepithelial lesion on cytologic smear of cervix (LGSIL): Secondary | ICD-10-CM | POA: Diagnosis not present

## 2020-03-09 DIAGNOSIS — J069 Acute upper respiratory infection, unspecified: Secondary | ICD-10-CM | POA: Diagnosis not present

## 2020-03-09 DIAGNOSIS — I480 Paroxysmal atrial fibrillation: Secondary | ICD-10-CM | POA: Diagnosis not present

## 2020-03-09 DIAGNOSIS — H353113 Nonexudative age-related macular degeneration, right eye, advanced atrophic without subfoveal involvement: Secondary | ICD-10-CM | POA: Diagnosis not present

## 2020-03-09 DIAGNOSIS — Z01812 Encounter for preprocedural laboratory examination: Secondary | ICD-10-CM | POA: Diagnosis not present

## 2020-03-09 DIAGNOSIS — M779 Enthesopathy, unspecified: Secondary | ICD-10-CM | POA: Diagnosis not present

## 2020-03-09 DIAGNOSIS — R300 Dysuria: Secondary | ICD-10-CM | POA: Diagnosis not present

## 2020-03-09 DIAGNOSIS — E1042 Type 1 diabetes mellitus with diabetic polyneuropathy: Secondary | ICD-10-CM | POA: Diagnosis not present

## 2020-03-09 DIAGNOSIS — G63 Polyneuropathy in diseases classified elsewhere: Secondary | ICD-10-CM | POA: Diagnosis not present

## 2020-03-09 DIAGNOSIS — K766 Portal hypertension: Secondary | ICD-10-CM | POA: Diagnosis not present

## 2020-03-09 DIAGNOSIS — R52 Pain, unspecified: Secondary | ICD-10-CM | POA: Diagnosis not present

## 2020-03-09 DIAGNOSIS — I4510 Unspecified right bundle-branch block: Secondary | ICD-10-CM | POA: Diagnosis not present

## 2020-03-09 DIAGNOSIS — C678 Malignant neoplasm of overlapping sites of bladder: Secondary | ICD-10-CM | POA: Diagnosis not present

## 2020-03-09 DIAGNOSIS — K921 Melena: Secondary | ICD-10-CM | POA: Diagnosis not present

## 2020-03-09 DIAGNOSIS — I472 Ventricular tachycardia: Secondary | ICD-10-CM | POA: Diagnosis not present

## 2020-03-09 DIAGNOSIS — Z4781 Encounter for orthopedic aftercare following surgical amputation: Secondary | ICD-10-CM | POA: Diagnosis not present

## 2020-03-09 DIAGNOSIS — M546 Pain in thoracic spine: Secondary | ICD-10-CM | POA: Diagnosis not present

## 2020-03-09 DIAGNOSIS — G620 Drug-induced polyneuropathy: Secondary | ICD-10-CM | POA: Diagnosis not present

## 2020-03-09 DIAGNOSIS — I5043 Acute on chronic combined systolic (congestive) and diastolic (congestive) heart failure: Secondary | ICD-10-CM | POA: Diagnosis not present

## 2020-03-09 DIAGNOSIS — Z9989 Dependence on other enabling machines and devices: Secondary | ICD-10-CM | POA: Diagnosis not present

## 2020-03-09 DIAGNOSIS — K7469 Other cirrhosis of liver: Secondary | ICD-10-CM | POA: Diagnosis not present

## 2020-03-09 DIAGNOSIS — E113291 Type 2 diabetes mellitus with mild nonproliferative diabetic retinopathy without macular edema, right eye: Secondary | ICD-10-CM | POA: Diagnosis not present

## 2020-03-09 DIAGNOSIS — M5442 Lumbago with sciatica, left side: Secondary | ICD-10-CM | POA: Diagnosis not present

## 2020-03-09 DIAGNOSIS — D0471 Carcinoma in situ of skin of right lower limb, including hip: Secondary | ICD-10-CM | POA: Diagnosis not present

## 2020-03-09 DIAGNOSIS — M7061 Trochanteric bursitis, right hip: Secondary | ICD-10-CM | POA: Diagnosis not present

## 2020-03-09 DIAGNOSIS — H353212 Exudative age-related macular degeneration, right eye, with inactive choroidal neovascularization: Secondary | ICD-10-CM | POA: Diagnosis not present

## 2020-03-09 DIAGNOSIS — D518 Other vitamin B12 deficiency anemias: Secondary | ICD-10-CM | POA: Diagnosis not present

## 2020-03-09 DIAGNOSIS — I498 Other specified cardiac arrhythmias: Secondary | ICD-10-CM | POA: Diagnosis not present

## 2020-03-09 DIAGNOSIS — Z8249 Family history of ischemic heart disease and other diseases of the circulatory system: Secondary | ICD-10-CM | POA: Diagnosis not present

## 2020-03-09 DIAGNOSIS — E782 Mixed hyperlipidemia: Secondary | ICD-10-CM | POA: Diagnosis not present

## 2020-03-09 DIAGNOSIS — M76821 Posterior tibial tendinitis, right leg: Secondary | ICD-10-CM | POA: Diagnosis not present

## 2020-03-09 DIAGNOSIS — K0889 Other specified disorders of teeth and supporting structures: Secondary | ICD-10-CM | POA: Diagnosis not present

## 2020-03-09 DIAGNOSIS — Z8041 Family history of malignant neoplasm of ovary: Secondary | ICD-10-CM | POA: Diagnosis not present

## 2020-03-09 DIAGNOSIS — R140 Abdominal distension (gaseous): Secondary | ICD-10-CM | POA: Diagnosis not present

## 2020-03-09 DIAGNOSIS — M9903 Segmental and somatic dysfunction of lumbar region: Secondary | ICD-10-CM | POA: Diagnosis not present

## 2020-03-09 DIAGNOSIS — H5201 Hypermetropia, right eye: Secondary | ICD-10-CM | POA: Diagnosis not present

## 2020-03-09 DIAGNOSIS — R112 Nausea with vomiting, unspecified: Secondary | ICD-10-CM | POA: Diagnosis not present

## 2020-03-09 DIAGNOSIS — I340 Nonrheumatic mitral (valve) insufficiency: Secondary | ICD-10-CM | POA: Diagnosis not present

## 2020-03-09 DIAGNOSIS — R410 Disorientation, unspecified: Secondary | ICD-10-CM | POA: Diagnosis not present

## 2020-03-09 DIAGNOSIS — R2242 Localized swelling, mass and lump, left lower limb: Secondary | ICD-10-CM | POA: Diagnosis not present

## 2020-03-09 DIAGNOSIS — J3089 Other allergic rhinitis: Secondary | ICD-10-CM | POA: Diagnosis not present

## 2020-03-09 DIAGNOSIS — Z09 Encounter for follow-up examination after completed treatment for conditions other than malignant neoplasm: Secondary | ICD-10-CM | POA: Diagnosis not present

## 2020-03-09 DIAGNOSIS — Z9889 Other specified postprocedural states: Secondary | ICD-10-CM | POA: Diagnosis not present

## 2020-03-09 DIAGNOSIS — H5202 Hypermetropia, left eye: Secondary | ICD-10-CM | POA: Diagnosis not present

## 2020-03-09 DIAGNOSIS — N185 Chronic kidney disease, stage 5: Secondary | ICD-10-CM | POA: Diagnosis not present

## 2020-03-09 DIAGNOSIS — F413 Other mixed anxiety disorders: Secondary | ICD-10-CM | POA: Diagnosis not present

## 2020-03-09 DIAGNOSIS — H40003 Preglaucoma, unspecified, bilateral: Secondary | ICD-10-CM | POA: Diagnosis not present

## 2020-03-09 DIAGNOSIS — M62838 Other muscle spasm: Secondary | ICD-10-CM | POA: Diagnosis not present

## 2020-03-09 DIAGNOSIS — T8450XD Infection and inflammatory reaction due to unspecified internal joint prosthesis, subsequent encounter: Secondary | ICD-10-CM | POA: Diagnosis not present

## 2020-03-09 DIAGNOSIS — Z720 Tobacco use: Secondary | ICD-10-CM | POA: Diagnosis not present

## 2020-03-09 DIAGNOSIS — M4727 Other spondylosis with radiculopathy, lumbosacral region: Secondary | ICD-10-CM | POA: Diagnosis not present

## 2020-03-09 DIAGNOSIS — I872 Venous insufficiency (chronic) (peripheral): Secondary | ICD-10-CM | POA: Diagnosis not present

## 2020-03-09 DIAGNOSIS — M961 Postlaminectomy syndrome, not elsewhere classified: Secondary | ICD-10-CM | POA: Diagnosis not present

## 2020-03-09 DIAGNOSIS — G9340 Encephalopathy, unspecified: Secondary | ICD-10-CM | POA: Diagnosis not present

## 2020-03-09 DIAGNOSIS — G250 Essential tremor: Secondary | ICD-10-CM | POA: Diagnosis not present

## 2020-03-09 DIAGNOSIS — Z8551 Personal history of malignant neoplasm of bladder: Secondary | ICD-10-CM | POA: Diagnosis not present

## 2020-03-09 DIAGNOSIS — H26491 Other secondary cataract, right eye: Secondary | ICD-10-CM | POA: Diagnosis not present

## 2020-03-09 DIAGNOSIS — Z0000 Encounter for general adult medical examination without abnormal findings: Secondary | ICD-10-CM | POA: Diagnosis not present

## 2020-03-09 DIAGNOSIS — L821 Other seborrheic keratosis: Secondary | ICD-10-CM | POA: Diagnosis not present

## 2020-03-09 DIAGNOSIS — C679 Malignant neoplasm of bladder, unspecified: Secondary | ICD-10-CM | POA: Diagnosis not present

## 2020-03-09 DIAGNOSIS — Z712 Person consulting for explanation of examination or test findings: Secondary | ICD-10-CM | POA: Diagnosis not present

## 2020-03-09 DIAGNOSIS — E11621 Type 2 diabetes mellitus with foot ulcer: Secondary | ICD-10-CM | POA: Diagnosis not present

## 2020-03-09 DIAGNOSIS — Z8614 Personal history of Methicillin resistant Staphylococcus aureus infection: Secondary | ICD-10-CM | POA: Diagnosis not present

## 2020-03-09 DIAGNOSIS — M2041 Other hammer toe(s) (acquired), right foot: Secondary | ICD-10-CM | POA: Diagnosis not present

## 2020-03-09 DIAGNOSIS — R42 Dizziness and giddiness: Secondary | ICD-10-CM | POA: Diagnosis not present

## 2020-03-09 DIAGNOSIS — J479 Bronchiectasis, uncomplicated: Secondary | ICD-10-CM | POA: Diagnosis not present

## 2020-03-09 DIAGNOSIS — Z86718 Personal history of other venous thrombosis and embolism: Secondary | ICD-10-CM | POA: Diagnosis not present

## 2020-03-09 DIAGNOSIS — D0472 Carcinoma in situ of skin of left lower limb, including hip: Secondary | ICD-10-CM | POA: Diagnosis not present

## 2020-03-09 DIAGNOSIS — I495 Sick sinus syndrome: Secondary | ICD-10-CM | POA: Diagnosis not present

## 2020-03-09 DIAGNOSIS — M6289 Other specified disorders of muscle: Secondary | ICD-10-CM | POA: Diagnosis not present

## 2020-03-09 DIAGNOSIS — Z87898 Personal history of other specified conditions: Secondary | ICD-10-CM | POA: Diagnosis not present

## 2020-03-09 DIAGNOSIS — H02839 Dermatochalasis of unspecified eye, unspecified eyelid: Secondary | ICD-10-CM | POA: Diagnosis not present

## 2020-03-09 DIAGNOSIS — K219 Gastro-esophageal reflux disease without esophagitis: Secondary | ICD-10-CM | POA: Diagnosis not present

## 2020-03-09 DIAGNOSIS — S70362A Insect bite (nonvenomous), left thigh, initial encounter: Secondary | ICD-10-CM | POA: Diagnosis not present

## 2020-03-09 DIAGNOSIS — I7102 Dissection of abdominal aorta: Secondary | ICD-10-CM | POA: Diagnosis not present

## 2020-03-09 DIAGNOSIS — I6529 Occlusion and stenosis of unspecified carotid artery: Secondary | ICD-10-CM | POA: Diagnosis not present

## 2020-03-09 DIAGNOSIS — R079 Chest pain, unspecified: Secondary | ICD-10-CM | POA: Diagnosis not present

## 2020-03-09 DIAGNOSIS — M5030 Other cervical disc degeneration, unspecified cervical region: Secondary | ICD-10-CM | POA: Diagnosis not present

## 2020-03-09 DIAGNOSIS — E1129 Type 2 diabetes mellitus with other diabetic kidney complication: Secondary | ICD-10-CM | POA: Diagnosis not present

## 2020-03-09 DIAGNOSIS — E1121 Type 2 diabetes mellitus with diabetic nephropathy: Secondary | ICD-10-CM | POA: Diagnosis not present

## 2020-03-09 DIAGNOSIS — Z7689 Persons encountering health services in other specified circumstances: Secondary | ICD-10-CM | POA: Diagnosis not present

## 2020-03-09 DIAGNOSIS — H401133 Primary open-angle glaucoma, bilateral, severe stage: Secondary | ICD-10-CM | POA: Diagnosis not present

## 2020-03-09 DIAGNOSIS — R5382 Chronic fatigue, unspecified: Secondary | ICD-10-CM | POA: Diagnosis not present

## 2020-03-09 DIAGNOSIS — R011 Cardiac murmur, unspecified: Secondary | ICD-10-CM | POA: Diagnosis not present

## 2020-03-09 DIAGNOSIS — L97523 Non-pressure chronic ulcer of other part of left foot with necrosis of muscle: Secondary | ICD-10-CM | POA: Diagnosis not present

## 2020-03-09 DIAGNOSIS — E113393 Type 2 diabetes mellitus with moderate nonproliferative diabetic retinopathy without macular edema, bilateral: Secondary | ICD-10-CM | POA: Diagnosis not present

## 2020-03-09 DIAGNOSIS — Z8709 Personal history of other diseases of the respiratory system: Secondary | ICD-10-CM | POA: Diagnosis not present

## 2020-03-09 DIAGNOSIS — H9190 Unspecified hearing loss, unspecified ear: Secondary | ICD-10-CM | POA: Diagnosis not present

## 2020-03-09 DIAGNOSIS — J42 Unspecified chronic bronchitis: Secondary | ICD-10-CM | POA: Diagnosis not present

## 2020-03-09 DIAGNOSIS — Z13820 Encounter for screening for osteoporosis: Secondary | ICD-10-CM | POA: Diagnosis not present

## 2020-03-09 DIAGNOSIS — H43822 Vitreomacular adhesion, left eye: Secondary | ICD-10-CM | POA: Diagnosis not present

## 2020-03-09 DIAGNOSIS — F3341 Major depressive disorder, recurrent, in partial remission: Secondary | ICD-10-CM | POA: Diagnosis not present

## 2020-03-09 DIAGNOSIS — H353132 Nonexudative age-related macular degeneration, bilateral, intermediate dry stage: Secondary | ICD-10-CM | POA: Diagnosis not present

## 2020-03-09 DIAGNOSIS — Z0001 Encounter for general adult medical examination with abnormal findings: Secondary | ICD-10-CM | POA: Diagnosis not present

## 2020-03-09 DIAGNOSIS — D171 Benign lipomatous neoplasm of skin and subcutaneous tissue of trunk: Secondary | ICD-10-CM | POA: Diagnosis not present

## 2020-03-09 DIAGNOSIS — Z08 Encounter for follow-up examination after completed treatment for malignant neoplasm: Secondary | ICD-10-CM | POA: Diagnosis not present

## 2020-03-09 DIAGNOSIS — Z125 Encounter for screening for malignant neoplasm of prostate: Secondary | ICD-10-CM | POA: Diagnosis not present

## 2020-03-09 DIAGNOSIS — B3781 Candidal esophagitis: Secondary | ICD-10-CM | POA: Diagnosis not present

## 2020-03-09 DIAGNOSIS — C20 Malignant neoplasm of rectum: Secondary | ICD-10-CM | POA: Diagnosis not present

## 2020-03-09 DIAGNOSIS — N6310 Unspecified lump in the right breast, unspecified quadrant: Secondary | ICD-10-CM | POA: Diagnosis not present

## 2020-03-09 DIAGNOSIS — R21 Rash and other nonspecific skin eruption: Secondary | ICD-10-CM | POA: Diagnosis not present

## 2020-03-09 DIAGNOSIS — L508 Other urticaria: Secondary | ICD-10-CM | POA: Diagnosis not present

## 2020-03-09 DIAGNOSIS — Z1159 Encounter for screening for other viral diseases: Secondary | ICD-10-CM | POA: Diagnosis not present

## 2020-03-09 DIAGNOSIS — R748 Abnormal levels of other serum enzymes: Secondary | ICD-10-CM | POA: Diagnosis not present

## 2020-03-09 DIAGNOSIS — I6522 Occlusion and stenosis of left carotid artery: Secondary | ICD-10-CM | POA: Diagnosis not present

## 2020-03-09 DIAGNOSIS — Z87891 Personal history of nicotine dependence: Secondary | ICD-10-CM | POA: Diagnosis not present

## 2020-03-09 DIAGNOSIS — R627 Adult failure to thrive: Secondary | ICD-10-CM | POA: Diagnosis not present

## 2020-03-09 DIAGNOSIS — F430 Acute stress reaction: Secondary | ICD-10-CM | POA: Diagnosis not present

## 2020-03-09 DIAGNOSIS — Z6836 Body mass index (BMI) 36.0-36.9, adult: Secondary | ICD-10-CM | POA: Diagnosis not present

## 2020-03-09 DIAGNOSIS — Z96653 Presence of artificial knee joint, bilateral: Secondary | ICD-10-CM | POA: Diagnosis not present

## 2020-03-09 DIAGNOSIS — Z950 Presence of cardiac pacemaker: Secondary | ICD-10-CM | POA: Diagnosis not present

## 2020-03-09 DIAGNOSIS — R252 Cramp and spasm: Secondary | ICD-10-CM | POA: Diagnosis not present

## 2020-03-09 DIAGNOSIS — Z1329 Encounter for screening for other suspected endocrine disorder: Secondary | ICD-10-CM | POA: Diagnosis not present

## 2020-03-09 DIAGNOSIS — M7062 Trochanteric bursitis, left hip: Secondary | ICD-10-CM | POA: Diagnosis not present

## 2020-03-09 DIAGNOSIS — M109 Gout, unspecified: Secondary | ICD-10-CM | POA: Diagnosis not present

## 2020-03-09 DIAGNOSIS — R000 Tachycardia, unspecified: Secondary | ICD-10-CM | POA: Diagnosis not present

## 2020-03-09 DIAGNOSIS — M79671 Pain in right foot: Secondary | ICD-10-CM | POA: Diagnosis not present

## 2020-03-09 DIAGNOSIS — R918 Other nonspecific abnormal finding of lung field: Secondary | ICD-10-CM | POA: Diagnosis not present

## 2020-03-09 DIAGNOSIS — M810 Age-related osteoporosis without current pathological fracture: Secondary | ICD-10-CM | POA: Diagnosis not present

## 2020-03-09 DIAGNOSIS — C7949 Secondary malignant neoplasm of other parts of nervous system: Secondary | ICD-10-CM | POA: Diagnosis not present

## 2020-03-09 DIAGNOSIS — N183 Chronic kidney disease, stage 3 unspecified: Secondary | ICD-10-CM | POA: Diagnosis not present

## 2020-03-09 DIAGNOSIS — H3530 Unspecified macular degeneration: Secondary | ICD-10-CM | POA: Diagnosis not present

## 2020-03-09 DIAGNOSIS — C652 Malignant neoplasm of left renal pelvis: Secondary | ICD-10-CM | POA: Diagnosis not present

## 2020-03-09 DIAGNOSIS — H02831 Dermatochalasis of right upper eyelid: Secondary | ICD-10-CM | POA: Diagnosis not present

## 2020-03-09 DIAGNOSIS — M79675 Pain in left toe(s): Secondary | ICD-10-CM | POA: Diagnosis not present

## 2020-03-09 DIAGNOSIS — Z902 Acquired absence of lung [part of]: Secondary | ICD-10-CM | POA: Diagnosis not present

## 2020-03-09 DIAGNOSIS — D225 Melanocytic nevi of trunk: Secondary | ICD-10-CM | POA: Diagnosis not present

## 2020-03-09 DIAGNOSIS — Z23 Encounter for immunization: Secondary | ICD-10-CM | POA: Diagnosis not present

## 2020-03-09 DIAGNOSIS — K8020 Calculus of gallbladder without cholecystitis without obstruction: Secondary | ICD-10-CM | POA: Diagnosis not present

## 2020-03-09 DIAGNOSIS — R58 Hemorrhage, not elsewhere classified: Secondary | ICD-10-CM | POA: Diagnosis not present

## 2020-03-09 DIAGNOSIS — Z789 Other specified health status: Secondary | ICD-10-CM | POA: Diagnosis not present

## 2020-03-09 DIAGNOSIS — H524 Presbyopia: Secondary | ICD-10-CM | POA: Diagnosis not present

## 2020-03-09 DIAGNOSIS — E1165 Type 2 diabetes mellitus with hyperglycemia: Secondary | ICD-10-CM | POA: Diagnosis not present

## 2020-03-09 DIAGNOSIS — E1142 Type 2 diabetes mellitus with diabetic polyneuropathy: Secondary | ICD-10-CM | POA: Diagnosis not present

## 2020-03-09 DIAGNOSIS — M5134 Other intervertebral disc degeneration, thoracic region: Secondary | ICD-10-CM | POA: Diagnosis not present

## 2020-03-09 DIAGNOSIS — N319 Neuromuscular dysfunction of bladder, unspecified: Secondary | ICD-10-CM | POA: Diagnosis not present

## 2020-03-09 DIAGNOSIS — I6990 Unspecified sequelae of unspecified cerebrovascular disease: Secondary | ICD-10-CM | POA: Diagnosis not present

## 2020-03-09 DIAGNOSIS — I088 Other rheumatic multiple valve diseases: Secondary | ICD-10-CM | POA: Diagnosis not present

## 2020-03-09 DIAGNOSIS — I639 Cerebral infarction, unspecified: Secondary | ICD-10-CM | POA: Diagnosis not present

## 2020-03-09 DIAGNOSIS — J387 Other diseases of larynx: Secondary | ICD-10-CM | POA: Diagnosis not present

## 2020-03-09 DIAGNOSIS — E890 Postprocedural hypothyroidism: Secondary | ICD-10-CM | POA: Diagnosis not present

## 2020-03-09 DIAGNOSIS — N393 Stress incontinence (female) (male): Secondary | ICD-10-CM | POA: Diagnosis not present

## 2020-03-09 DIAGNOSIS — S31000D Unspecified open wound of lower back and pelvis without penetration into retroperitoneum, subsequent encounter: Secondary | ICD-10-CM | POA: Diagnosis not present

## 2020-03-09 DIAGNOSIS — K5710 Diverticulosis of small intestine without perforation or abscess without bleeding: Secondary | ICD-10-CM | POA: Diagnosis not present

## 2020-03-09 DIAGNOSIS — J329 Chronic sinusitis, unspecified: Secondary | ICD-10-CM | POA: Diagnosis not present

## 2020-03-09 DIAGNOSIS — G2581 Restless legs syndrome: Secondary | ICD-10-CM | POA: Diagnosis not present

## 2020-03-09 DIAGNOSIS — Z1211 Encounter for screening for malignant neoplasm of colon: Secondary | ICD-10-CM | POA: Diagnosis not present

## 2020-03-09 DIAGNOSIS — Z45018 Encounter for adjustment and management of other part of cardiac pacemaker: Secondary | ICD-10-CM | POA: Diagnosis not present

## 2020-03-09 DIAGNOSIS — N186 End stage renal disease: Secondary | ICD-10-CM | POA: Diagnosis not present

## 2020-03-09 DIAGNOSIS — M19011 Primary osteoarthritis, right shoulder: Secondary | ICD-10-CM | POA: Diagnosis not present

## 2020-03-09 DIAGNOSIS — M353 Polymyalgia rheumatica: Secondary | ICD-10-CM | POA: Diagnosis not present

## 2020-03-09 DIAGNOSIS — Z9221 Personal history of antineoplastic chemotherapy: Secondary | ICD-10-CM | POA: Diagnosis not present

## 2020-03-09 DIAGNOSIS — M48061 Spinal stenosis, lumbar region without neurogenic claudication: Secondary | ICD-10-CM | POA: Diagnosis not present

## 2020-03-09 DIAGNOSIS — H5203 Hypermetropia, bilateral: Secondary | ICD-10-CM | POA: Diagnosis not present

## 2020-03-09 DIAGNOSIS — M545 Low back pain: Secondary | ICD-10-CM | POA: Diagnosis not present

## 2020-03-09 DIAGNOSIS — Z170 Estrogen receptor positive status [ER+]: Secondary | ICD-10-CM | POA: Diagnosis not present

## 2020-03-09 DIAGNOSIS — M62561 Muscle wasting and atrophy, not elsewhere classified, right lower leg: Secondary | ICD-10-CM | POA: Diagnosis not present

## 2020-03-09 DIAGNOSIS — I350 Nonrheumatic aortic (valve) stenosis: Secondary | ICD-10-CM | POA: Diagnosis not present

## 2020-03-09 DIAGNOSIS — J9811 Atelectasis: Secondary | ICD-10-CM | POA: Diagnosis not present

## 2020-03-09 DIAGNOSIS — I63411 Cerebral infarction due to embolism of right middle cerebral artery: Secondary | ICD-10-CM | POA: Diagnosis not present

## 2020-03-09 DIAGNOSIS — N289 Disorder of kidney and ureter, unspecified: Secondary | ICD-10-CM | POA: Diagnosis not present

## 2020-03-09 DIAGNOSIS — Z6828 Body mass index (BMI) 28.0-28.9, adult: Secondary | ICD-10-CM | POA: Diagnosis not present

## 2020-03-09 DIAGNOSIS — M25571 Pain in right ankle and joints of right foot: Secondary | ICD-10-CM | POA: Diagnosis not present

## 2020-03-09 DIAGNOSIS — R791 Abnormal coagulation profile: Secondary | ICD-10-CM | POA: Diagnosis not present

## 2020-03-09 DIAGNOSIS — M86072 Acute hematogenous osteomyelitis, left ankle and foot: Secondary | ICD-10-CM | POA: Diagnosis not present

## 2020-03-09 DIAGNOSIS — J209 Acute bronchitis, unspecified: Secondary | ICD-10-CM | POA: Diagnosis not present

## 2020-03-09 DIAGNOSIS — C211 Malignant neoplasm of anal canal: Secondary | ICD-10-CM | POA: Diagnosis not present

## 2020-03-09 DIAGNOSIS — A419 Sepsis, unspecified organism: Secondary | ICD-10-CM | POA: Diagnosis not present

## 2020-03-09 DIAGNOSIS — G40909 Epilepsy, unspecified, not intractable, without status epilepticus: Secondary | ICD-10-CM | POA: Diagnosis not present

## 2020-03-09 DIAGNOSIS — J45901 Unspecified asthma with (acute) exacerbation: Secondary | ICD-10-CM | POA: Diagnosis not present

## 2020-03-09 DIAGNOSIS — M7072 Other bursitis of hip, left hip: Secondary | ICD-10-CM | POA: Diagnosis not present

## 2020-03-09 DIAGNOSIS — H43393 Other vitreous opacities, bilateral: Secondary | ICD-10-CM | POA: Diagnosis not present

## 2020-03-09 DIAGNOSIS — K648 Other hemorrhoids: Secondary | ICD-10-CM | POA: Diagnosis not present

## 2020-03-09 DIAGNOSIS — M79605 Pain in left leg: Secondary | ICD-10-CM | POA: Diagnosis not present

## 2020-03-09 DIAGNOSIS — M25662 Stiffness of left knee, not elsewhere classified: Secondary | ICD-10-CM | POA: Diagnosis not present

## 2020-03-09 DIAGNOSIS — L089 Local infection of the skin and subcutaneous tissue, unspecified: Secondary | ICD-10-CM | POA: Diagnosis not present

## 2020-03-09 DIAGNOSIS — I051 Rheumatic mitral insufficiency: Secondary | ICD-10-CM | POA: Diagnosis not present

## 2020-03-09 DIAGNOSIS — G894 Chronic pain syndrome: Secondary | ICD-10-CM | POA: Diagnosis not present

## 2020-03-09 DIAGNOSIS — D510 Vitamin B12 deficiency anemia due to intrinsic factor deficiency: Secondary | ICD-10-CM | POA: Diagnosis not present

## 2020-03-09 DIAGNOSIS — Z853 Personal history of malignant neoplasm of breast: Secondary | ICD-10-CM | POA: Diagnosis not present

## 2020-03-09 DIAGNOSIS — F3342 Major depressive disorder, recurrent, in full remission: Secondary | ICD-10-CM | POA: Diagnosis not present

## 2020-03-09 DIAGNOSIS — R238 Other skin changes: Secondary | ICD-10-CM | POA: Diagnosis not present

## 2020-03-09 DIAGNOSIS — H5213 Myopia, bilateral: Secondary | ICD-10-CM | POA: Diagnosis not present

## 2020-03-09 DIAGNOSIS — M79661 Pain in right lower leg: Secondary | ICD-10-CM | POA: Diagnosis not present

## 2020-03-09 DIAGNOSIS — F309 Manic episode, unspecified: Secondary | ICD-10-CM | POA: Diagnosis not present

## 2020-03-09 DIAGNOSIS — K5190 Ulcerative colitis, unspecified, without complications: Secondary | ICD-10-CM | POA: Diagnosis not present

## 2020-03-09 DIAGNOSIS — Z954 Presence of other heart-valve replacement: Secondary | ICD-10-CM | POA: Diagnosis not present

## 2020-03-09 DIAGNOSIS — D72819 Decreased white blood cell count, unspecified: Secondary | ICD-10-CM | POA: Diagnosis not present

## 2020-03-09 DIAGNOSIS — D351 Benign neoplasm of parathyroid gland: Secondary | ICD-10-CM | POA: Diagnosis not present

## 2020-03-09 DIAGNOSIS — K8689 Other specified diseases of pancreas: Secondary | ICD-10-CM | POA: Diagnosis not present

## 2020-03-09 DIAGNOSIS — M25552 Pain in left hip: Secondary | ICD-10-CM | POA: Diagnosis not present

## 2020-03-09 DIAGNOSIS — H31091 Other chorioretinal scars, right eye: Secondary | ICD-10-CM | POA: Diagnosis not present

## 2020-03-09 DIAGNOSIS — Z86711 Personal history of pulmonary embolism: Secondary | ICD-10-CM | POA: Diagnosis not present

## 2020-03-09 DIAGNOSIS — Z1212 Encounter for screening for malignant neoplasm of rectum: Secondary | ICD-10-CM | POA: Diagnosis not present

## 2020-03-09 DIAGNOSIS — G801 Spastic diplegic cerebral palsy: Secondary | ICD-10-CM | POA: Diagnosis not present

## 2020-03-09 DIAGNOSIS — C441292 Squamous cell carcinoma of skin of left lower eyelid, including canthus: Secondary | ICD-10-CM | POA: Diagnosis not present

## 2020-03-09 DIAGNOSIS — I771 Stricture of artery: Secondary | ICD-10-CM | POA: Diagnosis not present

## 2020-03-09 DIAGNOSIS — D471 Chronic myeloproliferative disease: Secondary | ICD-10-CM | POA: Diagnosis not present

## 2020-03-09 DIAGNOSIS — L89892 Pressure ulcer of other site, stage 2: Secondary | ICD-10-CM | POA: Diagnosis not present

## 2020-03-09 DIAGNOSIS — C50311 Malignant neoplasm of lower-inner quadrant of right female breast: Secondary | ICD-10-CM | POA: Diagnosis not present

## 2020-03-09 DIAGNOSIS — F4312 Post-traumatic stress disorder, chronic: Secondary | ICD-10-CM | POA: Diagnosis not present

## 2020-03-09 DIAGNOSIS — D229 Melanocytic nevi, unspecified: Secondary | ICD-10-CM | POA: Diagnosis not present

## 2020-03-09 DIAGNOSIS — L718 Other rosacea: Secondary | ICD-10-CM | POA: Diagnosis not present

## 2020-03-09 DIAGNOSIS — E1169 Type 2 diabetes mellitus with other specified complication: Secondary | ICD-10-CM | POA: Diagnosis not present

## 2020-03-09 DIAGNOSIS — E1141 Type 2 diabetes mellitus with diabetic mononeuropathy: Secondary | ICD-10-CM | POA: Diagnosis not present

## 2020-03-09 DIAGNOSIS — M21961 Unspecified acquired deformity of right lower leg: Secondary | ICD-10-CM | POA: Diagnosis not present

## 2020-03-09 DIAGNOSIS — I471 Supraventricular tachycardia: Secondary | ICD-10-CM | POA: Diagnosis not present

## 2020-03-09 DIAGNOSIS — M129 Arthropathy, unspecified: Secondary | ICD-10-CM | POA: Diagnosis not present

## 2020-03-09 DIAGNOSIS — G43719 Chronic migraine without aura, intractable, without status migrainosus: Secondary | ICD-10-CM | POA: Diagnosis not present

## 2020-03-09 DIAGNOSIS — Z6829 Body mass index (BMI) 29.0-29.9, adult: Secondary | ICD-10-CM | POA: Diagnosis not present

## 2020-03-09 DIAGNOSIS — K31819 Angiodysplasia of stomach and duodenum without bleeding: Secondary | ICD-10-CM | POA: Diagnosis not present

## 2020-03-09 DIAGNOSIS — R9439 Abnormal result of other cardiovascular function study: Secondary | ICD-10-CM | POA: Diagnosis not present

## 2020-03-09 DIAGNOSIS — L02619 Cutaneous abscess of unspecified foot: Secondary | ICD-10-CM | POA: Diagnosis not present

## 2020-03-09 DIAGNOSIS — H35372 Puckering of macula, left eye: Secondary | ICD-10-CM | POA: Diagnosis not present

## 2020-03-09 DIAGNOSIS — I420 Dilated cardiomyopathy: Secondary | ICD-10-CM | POA: Diagnosis not present

## 2020-03-09 DIAGNOSIS — R2243 Localized swelling, mass and lump, lower limb, bilateral: Secondary | ICD-10-CM | POA: Diagnosis not present

## 2020-03-09 DIAGNOSIS — M62830 Muscle spasm of back: Secondary | ICD-10-CM | POA: Diagnosis not present

## 2020-03-09 DIAGNOSIS — I6521 Occlusion and stenosis of right carotid artery: Secondary | ICD-10-CM | POA: Diagnosis not present

## 2020-03-09 DIAGNOSIS — G5620 Lesion of ulnar nerve, unspecified upper limb: Secondary | ICD-10-CM | POA: Diagnosis not present

## 2020-03-09 DIAGNOSIS — K640 First degree hemorrhoids: Secondary | ICD-10-CM | POA: Diagnosis not present

## 2020-03-09 DIAGNOSIS — J342 Deviated nasal septum: Secondary | ICD-10-CM | POA: Diagnosis not present

## 2020-03-09 DIAGNOSIS — N9989 Other postprocedural complications and disorders of genitourinary system: Secondary | ICD-10-CM | POA: Diagnosis not present

## 2020-03-09 DIAGNOSIS — I779 Disorder of arteries and arterioles, unspecified: Secondary | ICD-10-CM | POA: Diagnosis not present

## 2020-03-09 DIAGNOSIS — M5386 Other specified dorsopathies, lumbar region: Secondary | ICD-10-CM | POA: Diagnosis not present

## 2020-03-09 DIAGNOSIS — M316 Other giant cell arteritis: Secondary | ICD-10-CM | POA: Diagnosis not present

## 2020-03-09 DIAGNOSIS — Z6826 Body mass index (BMI) 26.0-26.9, adult: Secondary | ICD-10-CM | POA: Diagnosis not present

## 2020-03-09 DIAGNOSIS — I63342 Cerebral infarction due to thrombosis of left cerebellar artery: Secondary | ICD-10-CM | POA: Diagnosis not present

## 2020-03-09 DIAGNOSIS — M5481 Occipital neuralgia: Secondary | ICD-10-CM | POA: Diagnosis not present

## 2020-03-09 DIAGNOSIS — M79604 Pain in right leg: Secondary | ICD-10-CM | POA: Diagnosis not present

## 2020-03-09 DIAGNOSIS — R9431 Abnormal electrocardiogram [ECG] [EKG]: Secondary | ICD-10-CM | POA: Diagnosis not present

## 2020-03-09 DIAGNOSIS — M47817 Spondylosis without myelopathy or radiculopathy, lumbosacral region: Secondary | ICD-10-CM | POA: Diagnosis not present

## 2020-03-09 DIAGNOSIS — I701 Atherosclerosis of renal artery: Secondary | ICD-10-CM | POA: Diagnosis not present

## 2020-03-09 DIAGNOSIS — M25551 Pain in right hip: Secondary | ICD-10-CM | POA: Diagnosis not present

## 2020-03-09 DIAGNOSIS — R809 Proteinuria, unspecified: Secondary | ICD-10-CM | POA: Diagnosis not present

## 2020-03-09 DIAGNOSIS — H35363 Drusen (degenerative) of macula, bilateral: Secondary | ICD-10-CM | POA: Diagnosis not present

## 2020-03-09 DIAGNOSIS — Z192 Hormone resistant malignancy status: Secondary | ICD-10-CM | POA: Diagnosis not present

## 2020-03-09 DIAGNOSIS — L298 Other pruritus: Secondary | ICD-10-CM | POA: Diagnosis not present

## 2020-03-09 DIAGNOSIS — R911 Solitary pulmonary nodule: Secondary | ICD-10-CM | POA: Diagnosis not present

## 2020-03-09 DIAGNOSIS — Z85118 Personal history of other malignant neoplasm of bronchus and lung: Secondary | ICD-10-CM | POA: Diagnosis not present

## 2020-03-09 DIAGNOSIS — S62102G Fracture of unspecified carpal bone, left wrist, subsequent encounter for fracture with delayed healing: Secondary | ICD-10-CM | POA: Diagnosis not present

## 2020-03-09 DIAGNOSIS — J189 Pneumonia, unspecified organism: Secondary | ICD-10-CM | POA: Diagnosis not present

## 2020-03-09 DIAGNOSIS — N184 Chronic kidney disease, stage 4 (severe): Secondary | ICD-10-CM | POA: Diagnosis not present

## 2020-03-09 DIAGNOSIS — M19041 Primary osteoarthritis, right hand: Secondary | ICD-10-CM | POA: Diagnosis not present

## 2020-03-09 DIAGNOSIS — G40901 Epilepsy, unspecified, not intractable, with status epilepticus: Secondary | ICD-10-CM | POA: Diagnosis not present

## 2020-03-09 DIAGNOSIS — L659 Nonscarring hair loss, unspecified: Secondary | ICD-10-CM | POA: Diagnosis not present

## 2020-03-09 DIAGNOSIS — J3081 Allergic rhinitis due to animal (cat) (dog) hair and dander: Secondary | ICD-10-CM | POA: Diagnosis not present

## 2020-03-09 DIAGNOSIS — L309 Dermatitis, unspecified: Secondary | ICD-10-CM | POA: Diagnosis not present

## 2020-03-09 DIAGNOSIS — M47812 Spondylosis without myelopathy or radiculopathy, cervical region: Secondary | ICD-10-CM | POA: Diagnosis not present

## 2020-03-09 DIAGNOSIS — F0391 Unspecified dementia with behavioral disturbance: Secondary | ICD-10-CM | POA: Diagnosis not present

## 2020-03-09 DIAGNOSIS — Z79890 Hormone replacement therapy (postmenopausal): Secondary | ICD-10-CM | POA: Diagnosis not present

## 2020-03-09 DIAGNOSIS — F17290 Nicotine dependence, other tobacco product, uncomplicated: Secondary | ICD-10-CM | POA: Diagnosis not present

## 2020-03-09 DIAGNOSIS — L905 Scar conditions and fibrosis of skin: Secondary | ICD-10-CM | POA: Diagnosis not present

## 2020-03-09 DIAGNOSIS — B009 Herpesviral infection, unspecified: Secondary | ICD-10-CM | POA: Diagnosis not present

## 2020-03-09 DIAGNOSIS — N941 Unspecified dyspareunia: Secondary | ICD-10-CM | POA: Diagnosis not present

## 2020-03-09 DIAGNOSIS — E119 Type 2 diabetes mellitus without complications: Secondary | ICD-10-CM | POA: Diagnosis not present

## 2020-03-09 DIAGNOSIS — D735 Infarction of spleen: Secondary | ICD-10-CM | POA: Diagnosis not present

## 2020-03-09 DIAGNOSIS — N390 Urinary tract infection, site not specified: Secondary | ICD-10-CM | POA: Diagnosis not present

## 2020-03-09 DIAGNOSIS — M869 Osteomyelitis, unspecified: Secondary | ICD-10-CM | POA: Diagnosis not present

## 2020-03-09 DIAGNOSIS — H43813 Vitreous degeneration, bilateral: Secondary | ICD-10-CM | POA: Diagnosis not present

## 2020-03-09 DIAGNOSIS — R2240 Localized swelling, mass and lump, unspecified lower limb: Secondary | ICD-10-CM | POA: Diagnosis not present

## 2020-03-09 DIAGNOSIS — B258 Other cytomegaloviral diseases: Secondary | ICD-10-CM | POA: Diagnosis not present

## 2020-03-09 DIAGNOSIS — C4490 Unspecified malignant neoplasm of skin, unspecified: Secondary | ICD-10-CM | POA: Diagnosis not present

## 2020-03-09 DIAGNOSIS — I700 Atherosclerosis of aorta: Secondary | ICD-10-CM | POA: Diagnosis not present

## 2020-03-09 DIAGNOSIS — Z4509 Encounter for adjustment and management of other cardiac device: Secondary | ICD-10-CM | POA: Diagnosis not present

## 2020-03-09 DIAGNOSIS — H3581 Retinal edema: Secondary | ICD-10-CM | POA: Diagnosis not present

## 2020-03-09 DIAGNOSIS — R3915 Urgency of urination: Secondary | ICD-10-CM | POA: Diagnosis not present

## 2020-03-09 DIAGNOSIS — F23 Brief psychotic disorder: Secondary | ICD-10-CM | POA: Diagnosis not present

## 2020-03-09 DIAGNOSIS — R5381 Other malaise: Secondary | ICD-10-CM | POA: Diagnosis not present

## 2020-03-09 DIAGNOSIS — C37 Malignant neoplasm of thymus: Secondary | ICD-10-CM | POA: Diagnosis not present

## 2020-03-09 DIAGNOSIS — M5431 Sciatica, right side: Secondary | ICD-10-CM | POA: Diagnosis not present

## 2020-03-09 DIAGNOSIS — I447 Left bundle-branch block, unspecified: Secondary | ICD-10-CM | POA: Diagnosis not present

## 2020-03-09 DIAGNOSIS — E1122 Type 2 diabetes mellitus with diabetic chronic kidney disease: Secondary | ICD-10-CM | POA: Diagnosis not present

## 2020-03-09 DIAGNOSIS — S199XXA Unspecified injury of neck, initial encounter: Secondary | ICD-10-CM | POA: Diagnosis not present

## 2020-03-09 DIAGNOSIS — B373 Candidiasis of vulva and vagina: Secondary | ICD-10-CM | POA: Diagnosis not present

## 2020-03-09 DIAGNOSIS — R3129 Other microscopic hematuria: Secondary | ICD-10-CM | POA: Diagnosis not present

## 2020-03-09 DIAGNOSIS — J3489 Other specified disorders of nose and nasal sinuses: Secondary | ICD-10-CM | POA: Diagnosis not present

## 2020-03-09 DIAGNOSIS — R922 Inconclusive mammogram: Secondary | ICD-10-CM | POA: Diagnosis not present

## 2020-03-09 DIAGNOSIS — Z8659 Personal history of other mental and behavioral disorders: Secondary | ICD-10-CM | POA: Diagnosis not present

## 2020-03-09 DIAGNOSIS — N3281 Overactive bladder: Secondary | ICD-10-CM | POA: Diagnosis not present

## 2020-03-09 DIAGNOSIS — R531 Weakness: Secondary | ICD-10-CM | POA: Diagnosis not present

## 2020-03-09 DIAGNOSIS — C50919 Malignant neoplasm of unspecified site of unspecified female breast: Secondary | ICD-10-CM | POA: Diagnosis not present

## 2020-03-09 DIAGNOSIS — Z122 Encounter for screening for malignant neoplasm of respiratory organs: Secondary | ICD-10-CM | POA: Diagnosis not present

## 2020-03-09 DIAGNOSIS — Z6825 Body mass index (BMI) 25.0-25.9, adult: Secondary | ICD-10-CM | POA: Diagnosis not present

## 2020-03-09 DIAGNOSIS — K449 Diaphragmatic hernia without obstruction or gangrene: Secondary | ICD-10-CM | POA: Diagnosis not present

## 2020-03-09 DIAGNOSIS — R8290 Unspecified abnormal findings in urine: Secondary | ICD-10-CM | POA: Diagnosis not present

## 2020-03-09 DIAGNOSIS — Z681 Body mass index (BMI) 19 or less, adult: Secondary | ICD-10-CM | POA: Diagnosis not present

## 2020-03-09 DIAGNOSIS — R262 Difficulty in walking, not elsewhere classified: Secondary | ICD-10-CM | POA: Diagnosis not present

## 2020-03-09 DIAGNOSIS — J309 Allergic rhinitis, unspecified: Secondary | ICD-10-CM | POA: Diagnosis not present

## 2020-03-09 DIAGNOSIS — L578 Other skin changes due to chronic exposure to nonionizing radiation: Secondary | ICD-10-CM | POA: Diagnosis not present

## 2020-03-09 DIAGNOSIS — H2512 Age-related nuclear cataract, left eye: Secondary | ICD-10-CM | POA: Diagnosis not present

## 2020-03-09 DIAGNOSIS — K7689 Other specified diseases of liver: Secondary | ICD-10-CM | POA: Diagnosis not present

## 2020-03-09 DIAGNOSIS — R3121 Asymptomatic microscopic hematuria: Secondary | ICD-10-CM | POA: Diagnosis not present

## 2020-03-09 DIAGNOSIS — I119 Hypertensive heart disease without heart failure: Secondary | ICD-10-CM | POA: Diagnosis not present

## 2020-03-09 DIAGNOSIS — L111 Transient acantholytic dermatosis [Grover]: Secondary | ICD-10-CM | POA: Diagnosis not present

## 2020-03-09 DIAGNOSIS — M65831 Other synovitis and tenosynovitis, right forearm: Secondary | ICD-10-CM | POA: Diagnosis not present

## 2020-03-09 DIAGNOSIS — N6092 Unspecified benign mammary dysplasia of left breast: Secondary | ICD-10-CM | POA: Diagnosis not present

## 2020-03-09 DIAGNOSIS — C44329 Squamous cell carcinoma of skin of other parts of face: Secondary | ICD-10-CM | POA: Diagnosis not present

## 2020-03-09 DIAGNOSIS — F321 Major depressive disorder, single episode, moderate: Secondary | ICD-10-CM | POA: Diagnosis not present

## 2020-03-09 DIAGNOSIS — R0602 Shortness of breath: Secondary | ICD-10-CM | POA: Diagnosis not present

## 2020-03-09 DIAGNOSIS — N3000 Acute cystitis without hematuria: Secondary | ICD-10-CM | POA: Diagnosis not present

## 2020-03-09 DIAGNOSIS — R0989 Other specified symptoms and signs involving the circulatory and respiratory systems: Secondary | ICD-10-CM | POA: Diagnosis not present

## 2020-03-09 DIAGNOSIS — D696 Thrombocytopenia, unspecified: Secondary | ICD-10-CM | POA: Diagnosis not present

## 2020-03-09 DIAGNOSIS — M75111 Incomplete rotator cuff tear or rupture of right shoulder, not specified as traumatic: Secondary | ICD-10-CM | POA: Diagnosis not present

## 2020-03-09 DIAGNOSIS — M4120 Other idiopathic scoliosis, site unspecified: Secondary | ICD-10-CM | POA: Diagnosis not present

## 2020-03-09 DIAGNOSIS — N471 Phimosis: Secondary | ICD-10-CM | POA: Diagnosis not present

## 2020-03-09 DIAGNOSIS — L259 Unspecified contact dermatitis, unspecified cause: Secondary | ICD-10-CM | POA: Diagnosis not present

## 2020-03-09 DIAGNOSIS — M461 Sacroiliitis, not elsewhere classified: Secondary | ICD-10-CM | POA: Diagnosis not present

## 2020-03-09 DIAGNOSIS — N898 Other specified noninflammatory disorders of vagina: Secondary | ICD-10-CM | POA: Diagnosis not present

## 2020-03-09 DIAGNOSIS — Z825 Family history of asthma and other chronic lower respiratory diseases: Secondary | ICD-10-CM | POA: Diagnosis not present

## 2020-03-09 DIAGNOSIS — F17200 Nicotine dependence, unspecified, uncomplicated: Secondary | ICD-10-CM | POA: Diagnosis not present

## 2020-03-09 DIAGNOSIS — M85859 Other specified disorders of bone density and structure, unspecified thigh: Secondary | ICD-10-CM | POA: Diagnosis not present

## 2020-03-09 DIAGNOSIS — M205X1 Other deformities of toe(s) (acquired), right foot: Secondary | ICD-10-CM | POA: Diagnosis not present

## 2020-03-09 DIAGNOSIS — D0512 Intraductal carcinoma in situ of left breast: Secondary | ICD-10-CM | POA: Diagnosis not present

## 2020-03-09 DIAGNOSIS — M8589 Other specified disorders of bone density and structure, multiple sites: Secondary | ICD-10-CM | POA: Diagnosis not present

## 2020-03-09 DIAGNOSIS — M9901 Segmental and somatic dysfunction of cervical region: Secondary | ICD-10-CM | POA: Diagnosis not present

## 2020-03-09 DIAGNOSIS — Z8711 Personal history of peptic ulcer disease: Secondary | ICD-10-CM | POA: Diagnosis not present

## 2020-03-09 DIAGNOSIS — K810 Acute cholecystitis: Secondary | ICD-10-CM | POA: Diagnosis not present

## 2020-03-09 DIAGNOSIS — M5440 Lumbago with sciatica, unspecified side: Secondary | ICD-10-CM | POA: Diagnosis not present

## 2020-03-09 DIAGNOSIS — M069 Rheumatoid arthritis, unspecified: Secondary | ICD-10-CM | POA: Diagnosis not present

## 2020-03-09 DIAGNOSIS — F988 Other specified behavioral and emotional disorders with onset usually occurring in childhood and adolescence: Secondary | ICD-10-CM | POA: Diagnosis not present

## 2020-03-09 DIAGNOSIS — Z9049 Acquired absence of other specified parts of digestive tract: Secondary | ICD-10-CM | POA: Diagnosis not present

## 2020-03-09 DIAGNOSIS — T82898A Other specified complication of vascular prosthetic devices, implants and grafts, initial encounter: Secondary | ICD-10-CM | POA: Diagnosis not present

## 2020-03-09 DIAGNOSIS — W5503XA Scratched by cat, initial encounter: Secondary | ICD-10-CM | POA: Diagnosis not present

## 2020-03-09 DIAGNOSIS — D485 Neoplasm of uncertain behavior of skin: Secondary | ICD-10-CM | POA: Diagnosis not present

## 2020-03-09 DIAGNOSIS — E669 Obesity, unspecified: Secondary | ICD-10-CM | POA: Diagnosis not present

## 2020-03-09 DIAGNOSIS — D508 Other iron deficiency anemias: Secondary | ICD-10-CM | POA: Diagnosis not present

## 2020-03-09 DIAGNOSIS — Z96612 Presence of left artificial shoulder joint: Secondary | ICD-10-CM | POA: Diagnosis not present

## 2020-03-09 DIAGNOSIS — H25013 Cortical age-related cataract, bilateral: Secondary | ICD-10-CM | POA: Diagnosis not present

## 2020-03-09 DIAGNOSIS — R93421 Abnormal radiologic findings on diagnostic imaging of right kidney: Secondary | ICD-10-CM | POA: Diagnosis not present

## 2020-03-09 DIAGNOSIS — F0280 Dementia in other diseases classified elsewhere without behavioral disturbance: Secondary | ICD-10-CM | POA: Diagnosis not present

## 2020-03-09 DIAGNOSIS — I69954 Hemiplegia and hemiparesis following unspecified cerebrovascular disease affecting left non-dominant side: Secondary | ICD-10-CM | POA: Diagnosis not present

## 2020-03-09 DIAGNOSIS — I5042 Chronic combined systolic (congestive) and diastolic (congestive) heart failure: Secondary | ICD-10-CM | POA: Diagnosis not present

## 2020-03-09 DIAGNOSIS — L239 Allergic contact dermatitis, unspecified cause: Secondary | ICD-10-CM | POA: Diagnosis not present

## 2020-03-09 DIAGNOSIS — N201 Calculus of ureter: Secondary | ICD-10-CM | POA: Diagnosis not present

## 2020-03-09 DIAGNOSIS — I25119 Atherosclerotic heart disease of native coronary artery with unspecified angina pectoris: Secondary | ICD-10-CM | POA: Diagnosis not present

## 2020-03-09 DIAGNOSIS — R633 Feeding difficulties: Secondary | ICD-10-CM | POA: Diagnosis not present

## 2020-03-09 DIAGNOSIS — C4371 Malignant melanoma of right lower limb, including hip: Secondary | ICD-10-CM | POA: Diagnosis not present

## 2020-03-09 DIAGNOSIS — S7291XD Unspecified fracture of right femur, subsequent encounter for closed fracture with routine healing: Secondary | ICD-10-CM | POA: Diagnosis not present

## 2020-03-09 DIAGNOSIS — Z96659 Presence of unspecified artificial knee joint: Secondary | ICD-10-CM | POA: Diagnosis not present

## 2020-03-09 DIAGNOSIS — S0083XA Contusion of other part of head, initial encounter: Secondary | ICD-10-CM | POA: Diagnosis not present

## 2020-03-09 DIAGNOSIS — M79662 Pain in left lower leg: Secondary | ICD-10-CM | POA: Diagnosis not present

## 2020-03-09 DIAGNOSIS — I469 Cardiac arrest, cause unspecified: Secondary | ICD-10-CM | POA: Diagnosis not present

## 2020-03-09 DIAGNOSIS — R6884 Jaw pain: Secondary | ICD-10-CM | POA: Diagnosis not present

## 2020-03-09 DIAGNOSIS — M7552 Bursitis of left shoulder: Secondary | ICD-10-CM | POA: Diagnosis not present

## 2020-03-09 DIAGNOSIS — G4489 Other headache syndrome: Secondary | ICD-10-CM | POA: Diagnosis not present

## 2020-03-09 DIAGNOSIS — L509 Urticaria, unspecified: Secondary | ICD-10-CM | POA: Diagnosis not present

## 2020-03-09 DIAGNOSIS — I959 Hypotension, unspecified: Secondary | ICD-10-CM | POA: Diagnosis not present

## 2020-03-09 DIAGNOSIS — E538 Deficiency of other specified B group vitamins: Secondary | ICD-10-CM | POA: Diagnosis not present

## 2020-03-09 DIAGNOSIS — T7800XD Anaphylactic reaction due to unspecified food, subsequent encounter: Secondary | ICD-10-CM | POA: Diagnosis not present

## 2020-03-09 DIAGNOSIS — H61303 Acquired stenosis of external ear canal, unspecified, bilateral: Secondary | ICD-10-CM | POA: Diagnosis not present

## 2020-03-09 DIAGNOSIS — E118 Type 2 diabetes mellitus with unspecified complications: Secondary | ICD-10-CM | POA: Diagnosis not present

## 2020-03-09 DIAGNOSIS — N83201 Unspecified ovarian cyst, right side: Secondary | ICD-10-CM | POA: Diagnosis not present

## 2020-03-09 DIAGNOSIS — D120 Benign neoplasm of cecum: Secondary | ICD-10-CM | POA: Diagnosis not present

## 2020-03-09 DIAGNOSIS — Z923 Personal history of irradiation: Secondary | ICD-10-CM | POA: Diagnosis not present

## 2020-03-09 DIAGNOSIS — M797 Fibromyalgia: Secondary | ICD-10-CM | POA: Diagnosis not present

## 2020-03-09 DIAGNOSIS — I77811 Abdominal aortic ectasia: Secondary | ICD-10-CM | POA: Diagnosis not present

## 2020-03-09 DIAGNOSIS — E1149 Type 2 diabetes mellitus with other diabetic neurological complication: Secondary | ICD-10-CM | POA: Diagnosis not present

## 2020-03-09 DIAGNOSIS — R4020 Unspecified coma: Secondary | ICD-10-CM | POA: Diagnosis not present

## 2020-03-09 DIAGNOSIS — D473 Essential (hemorrhagic) thrombocythemia: Secondary | ICD-10-CM | POA: Diagnosis not present

## 2020-03-09 DIAGNOSIS — M531 Cervicobrachial syndrome: Secondary | ICD-10-CM | POA: Diagnosis not present

## 2020-03-09 DIAGNOSIS — Z7902 Long term (current) use of antithrombotics/antiplatelets: Secondary | ICD-10-CM | POA: Diagnosis not present

## 2020-03-09 DIAGNOSIS — G35 Multiple sclerosis: Secondary | ICD-10-CM | POA: Diagnosis not present

## 2020-03-09 DIAGNOSIS — R6889 Other general symptoms and signs: Secondary | ICD-10-CM | POA: Diagnosis not present

## 2020-03-09 DIAGNOSIS — I272 Pulmonary hypertension, unspecified: Secondary | ICD-10-CM | POA: Diagnosis not present

## 2020-03-09 DIAGNOSIS — R194 Change in bowel habit: Secondary | ICD-10-CM | POA: Diagnosis not present

## 2020-03-09 DIAGNOSIS — Z6820 Body mass index (BMI) 20.0-20.9, adult: Secondary | ICD-10-CM | POA: Diagnosis not present

## 2020-03-09 DIAGNOSIS — M85831 Other specified disorders of bone density and structure, right forearm: Secondary | ICD-10-CM | POA: Diagnosis not present

## 2020-03-09 DIAGNOSIS — Z9181 History of falling: Secondary | ICD-10-CM | POA: Diagnosis not present

## 2020-03-09 DIAGNOSIS — E11628 Type 2 diabetes mellitus with other skin complications: Secondary | ICD-10-CM | POA: Diagnosis not present

## 2020-03-09 DIAGNOSIS — N529 Male erectile dysfunction, unspecified: Secondary | ICD-10-CM | POA: Diagnosis not present

## 2020-03-09 DIAGNOSIS — I25118 Atherosclerotic heart disease of native coronary artery with other forms of angina pectoris: Secondary | ICD-10-CM | POA: Diagnosis not present

## 2020-03-09 DIAGNOSIS — L608 Other nail disorders: Secondary | ICD-10-CM | POA: Diagnosis not present

## 2020-03-09 DIAGNOSIS — Z9079 Acquired absence of other genital organ(s): Secondary | ICD-10-CM | POA: Diagnosis not present

## 2020-03-09 DIAGNOSIS — K5909 Other constipation: Secondary | ICD-10-CM | POA: Diagnosis not present

## 2020-03-09 DIAGNOSIS — Z89421 Acquired absence of other right toe(s): Secondary | ICD-10-CM | POA: Diagnosis not present

## 2020-03-09 DIAGNOSIS — R0902 Hypoxemia: Secondary | ICD-10-CM | POA: Diagnosis not present

## 2020-03-09 DIAGNOSIS — W268XXA Contact with other sharp object(s), not elsewhere classified, initial encounter: Secondary | ICD-10-CM | POA: Diagnosis not present

## 2020-03-09 DIAGNOSIS — K649 Unspecified hemorrhoids: Secondary | ICD-10-CM | POA: Diagnosis not present

## 2020-03-09 DIAGNOSIS — I2782 Chronic pulmonary embolism: Secondary | ICD-10-CM | POA: Diagnosis not present

## 2020-03-09 DIAGNOSIS — Z7409 Other reduced mobility: Secondary | ICD-10-CM | POA: Diagnosis not present

## 2020-03-09 DIAGNOSIS — I70222 Atherosclerosis of native arteries of extremities with rest pain, left leg: Secondary | ICD-10-CM | POA: Diagnosis not present

## 2020-03-09 DIAGNOSIS — K8000 Calculus of gallbladder with acute cholecystitis without obstruction: Secondary | ICD-10-CM | POA: Diagnosis not present

## 2020-03-09 DIAGNOSIS — Z803 Family history of malignant neoplasm of breast: Secondary | ICD-10-CM | POA: Diagnosis not present

## 2020-03-09 DIAGNOSIS — S79912A Unspecified injury of left hip, initial encounter: Secondary | ICD-10-CM | POA: Diagnosis not present

## 2020-03-09 DIAGNOSIS — K3184 Gastroparesis: Secondary | ICD-10-CM | POA: Diagnosis not present

## 2020-03-09 DIAGNOSIS — T148XXA Other injury of unspecified body region, initial encounter: Secondary | ICD-10-CM | POA: Diagnosis not present

## 2020-03-09 DIAGNOSIS — K2960 Other gastritis without bleeding: Secondary | ICD-10-CM | POA: Diagnosis not present

## 2020-03-09 DIAGNOSIS — K589 Irritable bowel syndrome without diarrhea: Secondary | ICD-10-CM | POA: Diagnosis not present

## 2020-03-09 DIAGNOSIS — I129 Hypertensive chronic kidney disease with stage 1 through stage 4 chronic kidney disease, or unspecified chronic kidney disease: Secondary | ICD-10-CM | POA: Diagnosis not present

## 2020-03-09 DIAGNOSIS — Z808 Family history of malignant neoplasm of other organs or systems: Secondary | ICD-10-CM | POA: Diagnosis not present

## 2020-03-09 DIAGNOSIS — M5010 Cervical disc disorder with radiculopathy, unspecified cervical region: Secondary | ICD-10-CM | POA: Diagnosis not present

## 2020-03-09 DIAGNOSIS — I4891 Unspecified atrial fibrillation: Secondary | ICD-10-CM | POA: Diagnosis not present

## 2020-03-09 DIAGNOSIS — L03115 Cellulitis of right lower limb: Secondary | ICD-10-CM | POA: Diagnosis not present

## 2020-03-09 DIAGNOSIS — R030 Elevated blood-pressure reading, without diagnosis of hypertension: Secondary | ICD-10-CM | POA: Diagnosis not present

## 2020-03-09 DIAGNOSIS — R269 Unspecified abnormalities of gait and mobility: Secondary | ICD-10-CM | POA: Diagnosis not present

## 2020-03-09 DIAGNOSIS — M5441 Lumbago with sciatica, right side: Secondary | ICD-10-CM | POA: Diagnosis not present

## 2020-03-09 DIAGNOSIS — Z96643 Presence of artificial hip joint, bilateral: Secondary | ICD-10-CM | POA: Diagnosis not present

## 2020-03-09 DIAGNOSIS — H353133 Nonexudative age-related macular degeneration, bilateral, advanced atrophic without subfoveal involvement: Secondary | ICD-10-CM | POA: Diagnosis not present

## 2020-03-09 DIAGNOSIS — H538 Other visual disturbances: Secondary | ICD-10-CM | POA: Diagnosis not present

## 2020-03-09 DIAGNOSIS — H8111 Benign paroxysmal vertigo, right ear: Secondary | ICD-10-CM | POA: Diagnosis not present

## 2020-03-09 DIAGNOSIS — Z713 Dietary counseling and surveillance: Secondary | ICD-10-CM | POA: Diagnosis not present

## 2020-03-09 DIAGNOSIS — Z5111 Encounter for antineoplastic chemotherapy: Secondary | ICD-10-CM | POA: Diagnosis not present

## 2020-03-09 DIAGNOSIS — S8012XA Contusion of left lower leg, initial encounter: Secondary | ICD-10-CM | POA: Diagnosis not present

## 2020-03-09 DIAGNOSIS — F0150 Vascular dementia without behavioral disturbance: Secondary | ICD-10-CM | POA: Diagnosis not present

## 2020-03-09 DIAGNOSIS — S61452A Open bite of left hand, initial encounter: Secondary | ICD-10-CM | POA: Diagnosis not present

## 2020-03-09 DIAGNOSIS — Z1331 Encounter for screening for depression: Secondary | ICD-10-CM | POA: Diagnosis not present

## 2020-03-09 DIAGNOSIS — R1013 Epigastric pain: Secondary | ICD-10-CM | POA: Diagnosis not present

## 2020-03-09 DIAGNOSIS — H168 Other keratitis: Secondary | ICD-10-CM | POA: Diagnosis not present

## 2020-03-09 DIAGNOSIS — R251 Tremor, unspecified: Secondary | ICD-10-CM | POA: Diagnosis not present

## 2020-03-09 DIAGNOSIS — I2609 Other pulmonary embolism with acute cor pulmonale: Secondary | ICD-10-CM | POA: Diagnosis not present

## 2020-03-09 DIAGNOSIS — H9122 Sudden idiopathic hearing loss, left ear: Secondary | ICD-10-CM | POA: Diagnosis not present

## 2020-03-09 DIAGNOSIS — M5432 Sciatica, left side: Secondary | ICD-10-CM | POA: Diagnosis not present

## 2020-03-09 DIAGNOSIS — I809 Phlebitis and thrombophlebitis of unspecified site: Secondary | ICD-10-CM | POA: Diagnosis not present

## 2020-03-09 DIAGNOSIS — N200 Calculus of kidney: Secondary | ICD-10-CM | POA: Diagnosis not present

## 2020-03-09 DIAGNOSIS — L89301 Pressure ulcer of unspecified buttock, stage 1: Secondary | ICD-10-CM | POA: Diagnosis not present

## 2020-03-09 DIAGNOSIS — H43811 Vitreous degeneration, right eye: Secondary | ICD-10-CM | POA: Diagnosis not present

## 2020-03-09 DIAGNOSIS — S91102A Unspecified open wound of left great toe without damage to nail, initial encounter: Secondary | ICD-10-CM | POA: Diagnosis not present

## 2020-03-09 DIAGNOSIS — I890 Lymphedema, not elsewhere classified: Secondary | ICD-10-CM | POA: Diagnosis not present

## 2020-03-09 DIAGNOSIS — R7301 Impaired fasting glucose: Secondary | ICD-10-CM | POA: Diagnosis not present

## 2020-03-09 DIAGNOSIS — E1143 Type 2 diabetes mellitus with diabetic autonomic (poly)neuropathy: Secondary | ICD-10-CM | POA: Diagnosis not present

## 2020-03-09 DIAGNOSIS — H9202 Otalgia, left ear: Secondary | ICD-10-CM | POA: Diagnosis not present

## 2020-03-09 DIAGNOSIS — H9193 Unspecified hearing loss, bilateral: Secondary | ICD-10-CM | POA: Diagnosis not present

## 2020-03-09 DIAGNOSIS — H6091 Unspecified otitis externa, right ear: Secondary | ICD-10-CM | POA: Diagnosis not present

## 2020-03-09 DIAGNOSIS — C220 Liver cell carcinoma: Secondary | ICD-10-CM | POA: Diagnosis not present

## 2020-03-09 DIAGNOSIS — Z13220 Encounter for screening for lipoid disorders: Secondary | ICD-10-CM | POA: Diagnosis not present

## 2020-03-09 DIAGNOSIS — R928 Other abnormal and inconclusive findings on diagnostic imaging of breast: Secondary | ICD-10-CM | POA: Diagnosis not present

## 2020-03-09 DIAGNOSIS — R002 Palpitations: Secondary | ICD-10-CM | POA: Diagnosis not present

## 2020-03-09 DIAGNOSIS — F0390 Unspecified dementia without behavioral disturbance: Secondary | ICD-10-CM | POA: Diagnosis not present

## 2020-03-09 DIAGNOSIS — M8949 Other hypertrophic osteoarthropathy, multiple sites: Secondary | ICD-10-CM | POA: Diagnosis not present

## 2020-03-09 DIAGNOSIS — H4052X2 Glaucoma secondary to other eye disorders, left eye, moderate stage: Secondary | ICD-10-CM | POA: Diagnosis not present

## 2020-03-09 DIAGNOSIS — E43 Unspecified severe protein-calorie malnutrition: Secondary | ICD-10-CM | POA: Diagnosis not present

## 2020-03-09 DIAGNOSIS — R3914 Feeling of incomplete bladder emptying: Secondary | ICD-10-CM | POA: Diagnosis not present

## 2020-03-09 DIAGNOSIS — J986 Disorders of diaphragm: Secondary | ICD-10-CM | POA: Diagnosis not present

## 2020-03-09 DIAGNOSIS — X32XXXD Exposure to sunlight, subsequent encounter: Secondary | ICD-10-CM | POA: Diagnosis not present

## 2020-03-09 DIAGNOSIS — M8438XA Stress fracture, other site, initial encounter for fracture: Secondary | ICD-10-CM | POA: Diagnosis not present

## 2020-03-09 DIAGNOSIS — I5023 Acute on chronic systolic (congestive) heart failure: Secondary | ICD-10-CM | POA: Diagnosis not present

## 2020-03-09 DIAGNOSIS — I951 Orthostatic hypotension: Secondary | ICD-10-CM | POA: Diagnosis not present

## 2020-03-09 DIAGNOSIS — Z83511 Family history of glaucoma: Secondary | ICD-10-CM | POA: Diagnosis not present

## 2020-03-09 DIAGNOSIS — G43119 Migraine with aura, intractable, without status migrainosus: Secondary | ICD-10-CM | POA: Diagnosis not present

## 2020-03-09 DIAGNOSIS — D0511 Intraductal carcinoma in situ of right breast: Secondary | ICD-10-CM | POA: Diagnosis not present

## 2020-03-09 DIAGNOSIS — R0981 Nasal congestion: Secondary | ICD-10-CM | POA: Diagnosis not present

## 2020-03-09 DIAGNOSIS — R972 Elevated prostate specific antigen [PSA]: Secondary | ICD-10-CM | POA: Diagnosis not present

## 2020-03-09 DIAGNOSIS — I2510 Atherosclerotic heart disease of native coronary artery without angina pectoris: Secondary | ICD-10-CM | POA: Diagnosis not present

## 2020-03-09 DIAGNOSIS — J180 Bronchopneumonia, unspecified organism: Secondary | ICD-10-CM | POA: Diagnosis not present

## 2020-03-09 DIAGNOSIS — I4811 Longstanding persistent atrial fibrillation: Secondary | ICD-10-CM | POA: Diagnosis not present

## 2020-03-09 DIAGNOSIS — D849 Immunodeficiency, unspecified: Secondary | ICD-10-CM | POA: Diagnosis not present

## 2020-03-09 DIAGNOSIS — E1140 Type 2 diabetes mellitus with diabetic neuropathy, unspecified: Secondary | ICD-10-CM | POA: Diagnosis not present

## 2020-03-09 DIAGNOSIS — Z7189 Other specified counseling: Secondary | ICD-10-CM | POA: Diagnosis not present

## 2020-03-09 DIAGNOSIS — C4441 Basal cell carcinoma of skin of scalp and neck: Secondary | ICD-10-CM | POA: Diagnosis not present

## 2020-03-09 DIAGNOSIS — F324 Major depressive disorder, single episode, in partial remission: Secondary | ICD-10-CM | POA: Diagnosis not present

## 2020-03-09 DIAGNOSIS — F3160 Bipolar disorder, current episode mixed, unspecified: Secondary | ICD-10-CM | POA: Diagnosis not present

## 2020-03-09 DIAGNOSIS — N281 Cyst of kidney, acquired: Secondary | ICD-10-CM | POA: Diagnosis not present

## 2020-03-09 DIAGNOSIS — H6093 Unspecified otitis externa, bilateral: Secondary | ICD-10-CM | POA: Diagnosis not present

## 2020-03-09 DIAGNOSIS — M7989 Other specified soft tissue disorders: Secondary | ICD-10-CM | POA: Diagnosis not present

## 2020-03-09 DIAGNOSIS — M21619 Bunion of unspecified foot: Secondary | ICD-10-CM | POA: Diagnosis not present

## 2020-03-09 DIAGNOSIS — J984 Other disorders of lung: Secondary | ICD-10-CM | POA: Diagnosis not present

## 2020-03-09 DIAGNOSIS — R351 Nocturia: Secondary | ICD-10-CM | POA: Diagnosis not present

## 2020-03-09 DIAGNOSIS — M5416 Radiculopathy, lumbar region: Secondary | ICD-10-CM | POA: Diagnosis not present

## 2020-03-09 DIAGNOSIS — C7931 Secondary malignant neoplasm of brain: Secondary | ICD-10-CM | POA: Diagnosis not present

## 2020-03-09 DIAGNOSIS — Z780 Asymptomatic menopausal state: Secondary | ICD-10-CM | POA: Diagnosis not present

## 2020-03-09 DIAGNOSIS — Z85828 Personal history of other malignant neoplasm of skin: Secondary | ICD-10-CM | POA: Diagnosis not present

## 2020-03-09 DIAGNOSIS — H90A31 Mixed conductive and sensorineural hearing loss, unilateral, right ear with restricted hearing on the contralateral side: Secondary | ICD-10-CM | POA: Diagnosis not present

## 2020-03-09 DIAGNOSIS — S9032XA Contusion of left foot, initial encounter: Secondary | ICD-10-CM | POA: Diagnosis not present

## 2020-03-09 DIAGNOSIS — C8590 Non-Hodgkin lymphoma, unspecified, unspecified site: Secondary | ICD-10-CM | POA: Diagnosis not present

## 2020-03-09 DIAGNOSIS — M7712 Lateral epicondylitis, left elbow: Secondary | ICD-10-CM | POA: Diagnosis not present

## 2020-03-09 DIAGNOSIS — J432 Centrilobular emphysema: Secondary | ICD-10-CM | POA: Diagnosis not present

## 2020-03-09 DIAGNOSIS — L02415 Cutaneous abscess of right lower limb: Secondary | ICD-10-CM | POA: Diagnosis not present

## 2020-03-09 DIAGNOSIS — Z6833 Body mass index (BMI) 33.0-33.9, adult: Secondary | ICD-10-CM | POA: Diagnosis not present

## 2020-03-09 DIAGNOSIS — I7774 Dissection of vertebral artery: Secondary | ICD-10-CM | POA: Diagnosis not present

## 2020-03-09 DIAGNOSIS — N3289 Other specified disorders of bladder: Secondary | ICD-10-CM | POA: Diagnosis not present

## 2020-03-09 DIAGNOSIS — D2271 Melanocytic nevi of right lower limb, including hip: Secondary | ICD-10-CM | POA: Diagnosis not present

## 2020-03-09 DIAGNOSIS — C3490 Malignant neoplasm of unspecified part of unspecified bronchus or lung: Secondary | ICD-10-CM | POA: Diagnosis not present

## 2020-03-09 DIAGNOSIS — M85852 Other specified disorders of bone density and structure, left thigh: Secondary | ICD-10-CM | POA: Diagnosis not present

## 2020-03-09 DIAGNOSIS — M25411 Effusion, right shoulder: Secondary | ICD-10-CM | POA: Diagnosis not present

## 2020-03-09 DIAGNOSIS — Z77122 Contact with and (suspected) exposure to noise: Secondary | ICD-10-CM | POA: Diagnosis not present

## 2020-03-09 DIAGNOSIS — I359 Nonrheumatic aortic valve disorder, unspecified: Secondary | ICD-10-CM | POA: Diagnosis not present

## 2020-03-09 DIAGNOSIS — I1310 Hypertensive heart and chronic kidney disease without heart failure, with stage 1 through stage 4 chronic kidney disease, or unspecified chronic kidney disease: Secondary | ICD-10-CM | POA: Diagnosis not present

## 2020-03-09 DIAGNOSIS — L299 Pruritus, unspecified: Secondary | ICD-10-CM | POA: Diagnosis not present

## 2020-03-09 DIAGNOSIS — I6523 Occlusion and stenosis of bilateral carotid arteries: Secondary | ICD-10-CM | POA: Diagnosis not present

## 2020-03-09 DIAGNOSIS — R6882 Decreased libido: Secondary | ICD-10-CM | POA: Diagnosis not present

## 2020-03-09 DIAGNOSIS — N3941 Urge incontinence: Secondary | ICD-10-CM | POA: Diagnosis not present

## 2020-03-09 DIAGNOSIS — H10413 Chronic giant papillary conjunctivitis, bilateral: Secondary | ICD-10-CM | POA: Diagnosis not present

## 2020-03-09 DIAGNOSIS — M47896 Other spondylosis, lumbar region: Secondary | ICD-10-CM | POA: Diagnosis not present

## 2020-03-09 DIAGNOSIS — R3989 Other symptoms and signs involving the genitourinary system: Secondary | ICD-10-CM | POA: Diagnosis not present

## 2020-03-09 DIAGNOSIS — Z89431 Acquired absence of right foot: Secondary | ICD-10-CM | POA: Diagnosis not present

## 2020-03-09 DIAGNOSIS — D751 Secondary polycythemia: Secondary | ICD-10-CM | POA: Diagnosis not present

## 2020-03-09 DIAGNOSIS — J343 Hypertrophy of nasal turbinates: Secondary | ICD-10-CM | POA: Diagnosis not present

## 2020-03-09 DIAGNOSIS — N280 Ischemia and infarction of kidney: Secondary | ICD-10-CM | POA: Diagnosis not present

## 2020-03-09 DIAGNOSIS — M659 Synovitis and tenosynovitis, unspecified: Secondary | ICD-10-CM | POA: Diagnosis not present

## 2020-03-09 DIAGNOSIS — K560 Paralytic ileus: Secondary | ICD-10-CM | POA: Diagnosis not present

## 2020-03-09 DIAGNOSIS — H2513 Age-related nuclear cataract, bilateral: Secondary | ICD-10-CM | POA: Diagnosis not present

## 2020-03-09 DIAGNOSIS — M7751 Other enthesopathy of right foot: Secondary | ICD-10-CM | POA: Diagnosis not present

## 2020-03-09 DIAGNOSIS — M5136 Other intervertebral disc degeneration, lumbar region: Secondary | ICD-10-CM | POA: Diagnosis not present

## 2020-03-09 DIAGNOSIS — Z471 Aftercare following joint replacement surgery: Secondary | ICD-10-CM | POA: Diagnosis not present

## 2020-03-09 DIAGNOSIS — H5462 Unqualified visual loss, left eye, normal vision right eye: Secondary | ICD-10-CM | POA: Diagnosis not present

## 2020-03-09 DIAGNOSIS — D124 Benign neoplasm of descending colon: Secondary | ICD-10-CM | POA: Diagnosis not present

## 2020-03-09 DIAGNOSIS — I6350 Cerebral infarction due to unspecified occlusion or stenosis of unspecified cerebral artery: Secondary | ICD-10-CM | POA: Diagnosis not present

## 2020-03-09 DIAGNOSIS — Z8719 Personal history of other diseases of the digestive system: Secondary | ICD-10-CM | POA: Diagnosis not present

## 2020-03-09 DIAGNOSIS — N2889 Other specified disorders of kidney and ureter: Secondary | ICD-10-CM | POA: Diagnosis not present

## 2020-03-09 DIAGNOSIS — M179 Osteoarthritis of knee, unspecified: Secondary | ICD-10-CM | POA: Diagnosis not present

## 2020-03-09 DIAGNOSIS — B370 Candidal stomatitis: Secondary | ICD-10-CM | POA: Diagnosis not present

## 2020-03-09 DIAGNOSIS — R413 Other amnesia: Secondary | ICD-10-CM | POA: Diagnosis not present

## 2020-03-09 DIAGNOSIS — F5101 Primary insomnia: Secondary | ICD-10-CM | POA: Diagnosis not present

## 2020-03-09 DIAGNOSIS — R6251 Failure to thrive (child): Secondary | ICD-10-CM | POA: Diagnosis not present

## 2020-03-09 DIAGNOSIS — D487 Neoplasm of uncertain behavior of other specified sites: Secondary | ICD-10-CM | POA: Diagnosis not present

## 2020-03-09 DIAGNOSIS — H34812 Central retinal vein occlusion, left eye, with macular edema: Secondary | ICD-10-CM | POA: Diagnosis not present

## 2020-03-09 DIAGNOSIS — S59901A Unspecified injury of right elbow, initial encounter: Secondary | ICD-10-CM | POA: Diagnosis not present

## 2020-03-09 DIAGNOSIS — I96 Gangrene, not elsewhere classified: Secondary | ICD-10-CM | POA: Diagnosis not present

## 2020-03-09 DIAGNOSIS — M1120 Other chondrocalcinosis, unspecified site: Secondary | ICD-10-CM | POA: Diagnosis not present

## 2020-03-09 DIAGNOSIS — M1990 Unspecified osteoarthritis, unspecified site: Secondary | ICD-10-CM | POA: Diagnosis not present

## 2020-03-09 DIAGNOSIS — Z6824 Body mass index (BMI) 24.0-24.9, adult: Secondary | ICD-10-CM | POA: Diagnosis not present

## 2020-03-09 DIAGNOSIS — H0288B Meibomian gland dysfunction left eye, upper and lower eyelids: Secondary | ICD-10-CM | POA: Diagnosis not present

## 2020-03-09 DIAGNOSIS — M8588 Other specified disorders of bone density and structure, other site: Secondary | ICD-10-CM | POA: Diagnosis not present

## 2020-03-09 DIAGNOSIS — K3189 Other diseases of stomach and duodenum: Secondary | ICD-10-CM | POA: Diagnosis not present

## 2020-03-09 DIAGNOSIS — J449 Chronic obstructive pulmonary disease, unspecified: Secondary | ICD-10-CM | POA: Diagnosis not present

## 2020-03-09 DIAGNOSIS — M1A00X0 Idiopathic chronic gout, unspecified site, without tophus (tophi): Secondary | ICD-10-CM | POA: Diagnosis not present

## 2020-03-09 DIAGNOSIS — G8928 Other chronic postprocedural pain: Secondary | ICD-10-CM | POA: Diagnosis not present

## 2020-03-09 DIAGNOSIS — G4700 Insomnia, unspecified: Secondary | ICD-10-CM | POA: Diagnosis not present

## 2020-03-09 DIAGNOSIS — H16223 Keratoconjunctivitis sicca, not specified as Sjogren's, bilateral: Secondary | ICD-10-CM | POA: Diagnosis not present

## 2020-03-09 DIAGNOSIS — M1710 Unilateral primary osteoarthritis, unspecified knee: Secondary | ICD-10-CM | POA: Diagnosis not present

## 2020-03-09 DIAGNOSIS — I77819 Aortic ectasia, unspecified site: Secondary | ICD-10-CM | POA: Diagnosis not present

## 2020-03-09 DIAGNOSIS — M6749 Ganglion, multiple sites: Secondary | ICD-10-CM | POA: Diagnosis not present

## 2020-03-09 DIAGNOSIS — M25449 Effusion, unspecified hand: Secondary | ICD-10-CM | POA: Diagnosis not present

## 2020-03-09 DIAGNOSIS — M159 Polyosteoarthritis, unspecified: Secondary | ICD-10-CM | POA: Diagnosis not present

## 2020-03-09 DIAGNOSIS — H401112 Primary open-angle glaucoma, right eye, moderate stage: Secondary | ICD-10-CM | POA: Diagnosis not present

## 2020-03-09 DIAGNOSIS — R5383 Other fatigue: Secondary | ICD-10-CM | POA: Diagnosis not present

## 2020-03-09 DIAGNOSIS — M4802 Spinal stenosis, cervical region: Secondary | ICD-10-CM | POA: Diagnosis not present

## 2020-03-09 DIAGNOSIS — Z96651 Presence of right artificial knee joint: Secondary | ICD-10-CM | POA: Diagnosis not present

## 2020-03-09 DIAGNOSIS — M79674 Pain in right toe(s): Secondary | ICD-10-CM | POA: Diagnosis not present

## 2020-03-09 DIAGNOSIS — I5022 Chronic systolic (congestive) heart failure: Secondary | ICD-10-CM | POA: Diagnosis not present

## 2020-03-09 DIAGNOSIS — D259 Leiomyoma of uterus, unspecified: Secondary | ICD-10-CM | POA: Diagnosis not present

## 2020-03-09 DIAGNOSIS — R6520 Severe sepsis without septic shock: Secondary | ICD-10-CM | POA: Diagnosis not present

## 2020-03-09 DIAGNOSIS — Z6823 Body mass index (BMI) 23.0-23.9, adult: Secondary | ICD-10-CM | POA: Diagnosis not present

## 2020-03-09 DIAGNOSIS — R202 Paresthesia of skin: Secondary | ICD-10-CM | POA: Diagnosis not present

## 2020-03-09 DIAGNOSIS — M25562 Pain in left knee: Secondary | ICD-10-CM | POA: Diagnosis not present

## 2020-03-09 DIAGNOSIS — D49512 Neoplasm of unspecified behavior of left kidney: Secondary | ICD-10-CM | POA: Diagnosis not present

## 2020-03-09 DIAGNOSIS — H401132 Primary open-angle glaucoma, bilateral, moderate stage: Secondary | ICD-10-CM | POA: Diagnosis not present

## 2020-03-09 DIAGNOSIS — D1800 Hemangioma unspecified site: Secondary | ICD-10-CM | POA: Diagnosis not present

## 2020-03-09 DIAGNOSIS — I824Z9 Acute embolism and thrombosis of unspecified deep veins of unspecified distal lower extremity: Secondary | ICD-10-CM | POA: Diagnosis not present

## 2020-03-09 DIAGNOSIS — N50812 Left testicular pain: Secondary | ICD-10-CM | POA: Diagnosis not present

## 2020-03-09 DIAGNOSIS — S22080A Wedge compression fracture of T11-T12 vertebra, initial encounter for closed fracture: Secondary | ICD-10-CM | POA: Diagnosis not present

## 2020-03-09 DIAGNOSIS — R2231 Localized swelling, mass and lump, right upper limb: Secondary | ICD-10-CM | POA: Diagnosis not present

## 2020-03-09 DIAGNOSIS — M25511 Pain in right shoulder: Secondary | ICD-10-CM | POA: Diagnosis not present

## 2020-03-09 DIAGNOSIS — J4550 Severe persistent asthma, uncomplicated: Secondary | ICD-10-CM | POA: Diagnosis not present

## 2020-03-09 DIAGNOSIS — E2740 Unspecified adrenocortical insufficiency: Secondary | ICD-10-CM | POA: Diagnosis not present

## 2020-03-09 DIAGNOSIS — L97911 Non-pressure chronic ulcer of unspecified part of right lower leg limited to breakdown of skin: Secondary | ICD-10-CM | POA: Diagnosis not present

## 2020-03-09 DIAGNOSIS — T6701XA Heatstroke and sunstroke, initial encounter: Secondary | ICD-10-CM | POA: Diagnosis not present

## 2020-03-09 DIAGNOSIS — Z89412 Acquired absence of left great toe: Secondary | ICD-10-CM | POA: Diagnosis not present

## 2020-03-09 DIAGNOSIS — G301 Alzheimer's disease with late onset: Secondary | ICD-10-CM | POA: Diagnosis not present

## 2020-03-09 DIAGNOSIS — H353131 Nonexudative age-related macular degeneration, bilateral, early dry stage: Secondary | ICD-10-CM | POA: Diagnosis not present

## 2020-03-09 DIAGNOSIS — M65312 Trigger thumb, left thumb: Secondary | ICD-10-CM | POA: Diagnosis not present

## 2020-03-09 DIAGNOSIS — D62 Acute posthemorrhagic anemia: Secondary | ICD-10-CM | POA: Diagnosis not present

## 2020-03-09 DIAGNOSIS — R3911 Hesitancy of micturition: Secondary | ICD-10-CM | POA: Diagnosis not present

## 2020-03-09 DIAGNOSIS — G309 Alzheimer's disease, unspecified: Secondary | ICD-10-CM | POA: Diagnosis not present

## 2020-03-09 DIAGNOSIS — B0229 Other postherpetic nervous system involvement: Secondary | ICD-10-CM | POA: Diagnosis not present

## 2020-03-09 DIAGNOSIS — E038 Other specified hypothyroidism: Secondary | ICD-10-CM | POA: Diagnosis not present

## 2020-03-09 DIAGNOSIS — H6121 Impacted cerumen, right ear: Secondary | ICD-10-CM | POA: Diagnosis not present

## 2020-03-09 DIAGNOSIS — I255 Ischemic cardiomyopathy: Secondary | ICD-10-CM | POA: Diagnosis not present

## 2020-03-09 DIAGNOSIS — M25532 Pain in left wrist: Secondary | ICD-10-CM | POA: Diagnosis not present

## 2020-03-09 DIAGNOSIS — G5601 Carpal tunnel syndrome, right upper limb: Secondary | ICD-10-CM | POA: Diagnosis not present

## 2020-03-09 DIAGNOSIS — F5001 Anorexia nervosa, restricting type: Secondary | ICD-10-CM | POA: Diagnosis not present

## 2020-03-09 DIAGNOSIS — E6609 Other obesity due to excess calories: Secondary | ICD-10-CM | POA: Diagnosis not present

## 2020-03-09 DIAGNOSIS — Z20828 Contact with and (suspected) exposure to other viral communicable diseases: Secondary | ICD-10-CM | POA: Diagnosis not present

## 2020-03-09 DIAGNOSIS — F439 Reaction to severe stress, unspecified: Secondary | ICD-10-CM | POA: Diagnosis not present

## 2020-03-09 DIAGNOSIS — Z5321 Procedure and treatment not carried out due to patient leaving prior to being seen by health care provider: Secondary | ICD-10-CM | POA: Diagnosis not present

## 2020-03-09 DIAGNOSIS — R195 Other fecal abnormalities: Secondary | ICD-10-CM | POA: Diagnosis not present

## 2020-03-09 DIAGNOSIS — F322 Major depressive disorder, single episode, severe without psychotic features: Secondary | ICD-10-CM | POA: Diagnosis not present

## 2020-03-09 DIAGNOSIS — Z1283 Encounter for screening for malignant neoplasm of skin: Secondary | ICD-10-CM | POA: Diagnosis not present

## 2020-03-09 DIAGNOSIS — I259 Chronic ischemic heart disease, unspecified: Secondary | ICD-10-CM | POA: Diagnosis not present

## 2020-03-09 DIAGNOSIS — F314 Bipolar disorder, current episode depressed, severe, without psychotic features: Secondary | ICD-10-CM | POA: Diagnosis not present

## 2020-03-09 DIAGNOSIS — M5415 Radiculopathy, thoracolumbar region: Secondary | ICD-10-CM | POA: Diagnosis not present

## 2020-03-09 DIAGNOSIS — D61818 Other pancytopenia: Secondary | ICD-10-CM | POA: Diagnosis not present

## 2020-03-09 DIAGNOSIS — B001 Herpesviral vesicular dermatitis: Secondary | ICD-10-CM | POA: Diagnosis not present

## 2020-03-09 DIAGNOSIS — R946 Abnormal results of thyroid function studies: Secondary | ICD-10-CM | POA: Diagnosis not present

## 2020-03-09 DIAGNOSIS — Z961 Presence of intraocular lens: Secondary | ICD-10-CM | POA: Diagnosis not present

## 2020-03-09 DIAGNOSIS — G8929 Other chronic pain: Secondary | ICD-10-CM | POA: Diagnosis not present

## 2020-03-09 DIAGNOSIS — N3010 Interstitial cystitis (chronic) without hematuria: Secondary | ICD-10-CM | POA: Diagnosis not present

## 2020-03-09 DIAGNOSIS — R0609 Other forms of dyspnea: Secondary | ICD-10-CM | POA: Diagnosis not present

## 2020-03-09 DIAGNOSIS — I0981 Rheumatic heart failure: Secondary | ICD-10-CM | POA: Diagnosis not present

## 2020-03-09 DIAGNOSIS — L97519 Non-pressure chronic ulcer of other part of right foot with unspecified severity: Secondary | ICD-10-CM | POA: Diagnosis not present

## 2020-03-09 DIAGNOSIS — K253 Acute gastric ulcer without hemorrhage or perforation: Secondary | ICD-10-CM | POA: Diagnosis not present

## 2020-03-09 DIAGNOSIS — R1030 Lower abdominal pain, unspecified: Secondary | ICD-10-CM | POA: Diagnosis not present

## 2020-03-09 DIAGNOSIS — N189 Chronic kidney disease, unspecified: Secondary | ICD-10-CM | POA: Diagnosis not present

## 2020-03-09 DIAGNOSIS — M25569 Pain in unspecified knee: Secondary | ICD-10-CM | POA: Diagnosis not present

## 2020-03-09 DIAGNOSIS — Z03818 Encounter for observation for suspected exposure to other biological agents ruled out: Secondary | ICD-10-CM | POA: Diagnosis not present

## 2020-03-09 DIAGNOSIS — D2261 Melanocytic nevi of right upper limb, including shoulder: Secondary | ICD-10-CM | POA: Diagnosis not present

## 2020-03-09 DIAGNOSIS — H9201 Otalgia, right ear: Secondary | ICD-10-CM | POA: Diagnosis not present

## 2020-03-09 DIAGNOSIS — B351 Tinea unguium: Secondary | ICD-10-CM | POA: Diagnosis not present

## 2020-03-09 DIAGNOSIS — Z6831 Body mass index (BMI) 31.0-31.9, adult: Secondary | ICD-10-CM | POA: Diagnosis not present

## 2020-03-09 DIAGNOSIS — X32XXXA Exposure to sunlight, initial encounter: Secondary | ICD-10-CM | POA: Diagnosis not present

## 2020-03-09 DIAGNOSIS — Z881 Allergy status to other antibiotic agents status: Secondary | ICD-10-CM | POA: Diagnosis not present

## 2020-03-09 DIAGNOSIS — Z8541 Personal history of malignant neoplasm of cervix uteri: Secondary | ICD-10-CM | POA: Diagnosis not present

## 2020-03-09 DIAGNOSIS — F1190 Opioid use, unspecified, uncomplicated: Secondary | ICD-10-CM | POA: Diagnosis not present

## 2020-03-09 DIAGNOSIS — I712 Thoracic aortic aneurysm, without rupture: Secondary | ICD-10-CM | POA: Diagnosis not present

## 2020-03-09 DIAGNOSIS — H25041 Posterior subcapsular polar age-related cataract, right eye: Secondary | ICD-10-CM | POA: Diagnosis not present

## 2020-03-09 DIAGNOSIS — I248 Other forms of acute ischemic heart disease: Secondary | ICD-10-CM | POA: Diagnosis not present

## 2020-03-09 DIAGNOSIS — K635 Polyp of colon: Secondary | ICD-10-CM | POA: Diagnosis not present

## 2020-03-09 DIAGNOSIS — M25561 Pain in right knee: Secondary | ICD-10-CM | POA: Diagnosis not present

## 2020-03-09 DIAGNOSIS — K5090 Crohn's disease, unspecified, without complications: Secondary | ICD-10-CM | POA: Diagnosis not present

## 2020-03-09 DIAGNOSIS — H9312 Tinnitus, left ear: Secondary | ICD-10-CM | POA: Diagnosis not present

## 2020-03-09 DIAGNOSIS — M170 Bilateral primary osteoarthritis of knee: Secondary | ICD-10-CM | POA: Diagnosis not present

## 2020-03-09 DIAGNOSIS — E872 Acidosis: Secondary | ICD-10-CM | POA: Diagnosis not present

## 2020-03-09 DIAGNOSIS — K259 Gastric ulcer, unspecified as acute or chronic, without hemorrhage or perforation: Secondary | ICD-10-CM | POA: Diagnosis not present

## 2020-03-09 DIAGNOSIS — F418 Other specified anxiety disorders: Secondary | ICD-10-CM | POA: Diagnosis not present

## 2020-03-09 DIAGNOSIS — R110 Nausea: Secondary | ICD-10-CM | POA: Diagnosis not present

## 2020-03-09 DIAGNOSIS — L84 Corns and callosities: Secondary | ICD-10-CM | POA: Diagnosis not present

## 2020-03-09 DIAGNOSIS — R197 Diarrhea, unspecified: Secondary | ICD-10-CM | POA: Diagnosis not present

## 2020-03-09 DIAGNOSIS — Z20822 Contact with and (suspected) exposure to covid-19: Secondary | ICD-10-CM | POA: Diagnosis not present

## 2020-03-09 DIAGNOSIS — I63212 Cerebral infarction due to unspecified occlusion or stenosis of left vertebral arteries: Secondary | ICD-10-CM | POA: Diagnosis not present

## 2020-03-09 DIAGNOSIS — E0590 Thyrotoxicosis, unspecified without thyrotoxic crisis or storm: Secondary | ICD-10-CM | POA: Diagnosis not present

## 2020-03-09 DIAGNOSIS — L80 Vitiligo: Secondary | ICD-10-CM | POA: Diagnosis not present

## 2020-03-09 DIAGNOSIS — K627 Radiation proctitis: Secondary | ICD-10-CM | POA: Diagnosis not present

## 2020-03-09 DIAGNOSIS — Z483 Aftercare following surgery for neoplasm: Secondary | ICD-10-CM | POA: Diagnosis not present

## 2020-03-09 DIAGNOSIS — L600 Ingrowing nail: Secondary | ICD-10-CM | POA: Diagnosis not present

## 2020-03-09 DIAGNOSIS — Z8546 Personal history of malignant neoplasm of prostate: Secondary | ICD-10-CM | POA: Diagnosis not present

## 2020-03-09 DIAGNOSIS — R12 Heartburn: Secondary | ICD-10-CM | POA: Diagnosis not present

## 2020-03-09 DIAGNOSIS — J0110 Acute frontal sinusitis, unspecified: Secondary | ICD-10-CM | POA: Diagnosis not present

## 2020-03-09 DIAGNOSIS — R635 Abnormal weight gain: Secondary | ICD-10-CM | POA: Diagnosis not present

## 2020-03-09 DIAGNOSIS — C50511 Malignant neoplasm of lower-outer quadrant of right female breast: Secondary | ICD-10-CM | POA: Diagnosis not present

## 2020-03-09 DIAGNOSIS — T7840XA Allergy, unspecified, initial encounter: Secondary | ICD-10-CM | POA: Diagnosis not present

## 2020-03-09 DIAGNOSIS — W010XXA Fall on same level from slipping, tripping and stumbling without subsequent striking against object, initial encounter: Secondary | ICD-10-CM | POA: Diagnosis not present

## 2020-03-09 DIAGNOSIS — W5501XA Bitten by cat, initial encounter: Secondary | ICD-10-CM | POA: Diagnosis not present

## 2020-03-09 DIAGNOSIS — M898X6 Other specified disorders of bone, lower leg: Secondary | ICD-10-CM | POA: Diagnosis not present

## 2020-03-09 DIAGNOSIS — N62 Hypertrophy of breast: Secondary | ICD-10-CM | POA: Diagnosis not present

## 2020-03-09 DIAGNOSIS — M160 Bilateral primary osteoarthritis of hip: Secondary | ICD-10-CM | POA: Diagnosis not present

## 2020-03-09 DIAGNOSIS — D242 Benign neoplasm of left breast: Secondary | ICD-10-CM | POA: Diagnosis not present

## 2020-03-09 DIAGNOSIS — M791 Myalgia, unspecified site: Secondary | ICD-10-CM | POA: Diagnosis not present

## 2020-03-09 DIAGNOSIS — M1612 Unilateral primary osteoarthritis, left hip: Secondary | ICD-10-CM | POA: Diagnosis not present

## 2020-03-09 DIAGNOSIS — L97921 Non-pressure chronic ulcer of unspecified part of left lower leg limited to breakdown of skin: Secondary | ICD-10-CM | POA: Diagnosis not present

## 2020-03-09 DIAGNOSIS — M9904 Segmental and somatic dysfunction of sacral region: Secondary | ICD-10-CM | POA: Diagnosis not present

## 2020-03-09 DIAGNOSIS — I4819 Other persistent atrial fibrillation: Secondary | ICD-10-CM | POA: Diagnosis not present

## 2020-03-09 DIAGNOSIS — E871 Hypo-osmolality and hyponatremia: Secondary | ICD-10-CM | POA: Diagnosis not present

## 2020-03-09 DIAGNOSIS — Z992 Dependence on renal dialysis: Secondary | ICD-10-CM | POA: Diagnosis not present

## 2020-03-09 DIAGNOSIS — J441 Chronic obstructive pulmonary disease with (acute) exacerbation: Secondary | ICD-10-CM | POA: Diagnosis not present

## 2020-03-09 DIAGNOSIS — J9611 Chronic respiratory failure with hypoxia: Secondary | ICD-10-CM | POA: Diagnosis not present

## 2020-03-09 DIAGNOSIS — T8141XA Infection following a procedure, superficial incisional surgical site, initial encounter: Secondary | ICD-10-CM | POA: Diagnosis not present

## 2020-03-09 DIAGNOSIS — H25813 Combined forms of age-related cataract, bilateral: Secondary | ICD-10-CM | POA: Diagnosis not present

## 2020-03-09 DIAGNOSIS — I428 Other cardiomyopathies: Secondary | ICD-10-CM | POA: Diagnosis not present

## 2020-03-09 DIAGNOSIS — R1084 Generalized abdominal pain: Secondary | ICD-10-CM | POA: Diagnosis not present

## 2020-03-09 DIAGNOSIS — I5031 Acute diastolic (congestive) heart failure: Secondary | ICD-10-CM | POA: Diagnosis not present

## 2020-03-09 DIAGNOSIS — N19 Unspecified kidney failure: Secondary | ICD-10-CM | POA: Diagnosis not present

## 2020-03-09 DIAGNOSIS — L400 Psoriasis vulgaris: Secondary | ICD-10-CM | POA: Diagnosis not present

## 2020-03-09 DIAGNOSIS — S46012D Strain of muscle(s) and tendon(s) of the rotator cuff of left shoulder, subsequent encounter: Secondary | ICD-10-CM | POA: Diagnosis not present

## 2020-03-09 DIAGNOSIS — Z86018 Personal history of other benign neoplasm: Secondary | ICD-10-CM | POA: Diagnosis not present

## 2020-03-09 DIAGNOSIS — I6300 Cerebral infarction due to thrombosis of unspecified precerebral artery: Secondary | ICD-10-CM | POA: Diagnosis not present

## 2020-03-09 DIAGNOSIS — H02834 Dermatochalasis of left upper eyelid: Secondary | ICD-10-CM | POA: Diagnosis not present

## 2020-03-09 DIAGNOSIS — Z8542 Personal history of malignant neoplasm of other parts of uterus: Secondary | ICD-10-CM | POA: Diagnosis not present

## 2020-03-09 DIAGNOSIS — J9610 Chronic respiratory failure, unspecified whether with hypoxia or hypercapnia: Secondary | ICD-10-CM | POA: Diagnosis not present

## 2020-03-09 DIAGNOSIS — M62512 Muscle wasting and atrophy, not elsewhere classified, left shoulder: Secondary | ICD-10-CM | POA: Diagnosis not present

## 2020-03-09 DIAGNOSIS — J310 Chronic rhinitis: Secondary | ICD-10-CM | POA: Diagnosis not present

## 2020-03-09 DIAGNOSIS — Z96641 Presence of right artificial hip joint: Secondary | ICD-10-CM | POA: Diagnosis not present

## 2020-03-09 DIAGNOSIS — S7291XA Unspecified fracture of right femur, initial encounter for closed fracture: Secondary | ICD-10-CM | POA: Diagnosis not present

## 2020-03-09 DIAGNOSIS — J302 Other seasonal allergic rhinitis: Secondary | ICD-10-CM | POA: Diagnosis not present

## 2020-03-09 DIAGNOSIS — Z886 Allergy status to analgesic agent status: Secondary | ICD-10-CM | POA: Diagnosis not present

## 2020-03-09 DIAGNOSIS — H5712 Ocular pain, left eye: Secondary | ICD-10-CM | POA: Diagnosis not present

## 2020-03-09 DIAGNOSIS — F320 Major depressive disorder, single episode, mild: Secondary | ICD-10-CM | POA: Diagnosis not present

## 2020-03-09 DIAGNOSIS — R2241 Localized swelling, mass and lump, right lower limb: Secondary | ICD-10-CM | POA: Diagnosis not present

## 2020-03-09 DIAGNOSIS — D692 Other nonthrombocytopenic purpura: Secondary | ICD-10-CM | POA: Diagnosis not present

## 2020-03-09 DIAGNOSIS — E11319 Type 2 diabetes mellitus with unspecified diabetic retinopathy without macular edema: Secondary | ICD-10-CM | POA: Diagnosis not present

## 2020-03-09 DIAGNOSIS — C189 Malignant neoplasm of colon, unspecified: Secondary | ICD-10-CM | POA: Diagnosis not present

## 2020-03-09 DIAGNOSIS — R001 Bradycardia, unspecified: Secondary | ICD-10-CM | POA: Diagnosis not present

## 2020-03-09 DIAGNOSIS — Z01419 Encounter for gynecological examination (general) (routine) without abnormal findings: Secondary | ICD-10-CM | POA: Diagnosis not present

## 2020-03-09 DIAGNOSIS — Z48812 Encounter for surgical aftercare following surgery on the circulatory system: Secondary | ICD-10-CM | POA: Diagnosis not present

## 2020-03-09 DIAGNOSIS — W19XXXA Unspecified fall, initial encounter: Secondary | ICD-10-CM | POA: Diagnosis not present

## 2020-03-09 DIAGNOSIS — R509 Fever, unspecified: Secondary | ICD-10-CM | POA: Diagnosis not present

## 2020-03-09 DIAGNOSIS — R2689 Other abnormalities of gait and mobility: Secondary | ICD-10-CM | POA: Diagnosis not present

## 2020-03-09 DIAGNOSIS — F419 Anxiety disorder, unspecified: Secondary | ICD-10-CM | POA: Diagnosis not present

## 2020-03-09 DIAGNOSIS — I6782 Cerebral ischemia: Secondary | ICD-10-CM | POA: Diagnosis not present

## 2020-03-09 DIAGNOSIS — Z01810 Encounter for preprocedural cardiovascular examination: Secondary | ICD-10-CM | POA: Diagnosis not present

## 2020-03-09 DIAGNOSIS — G3184 Mild cognitive impairment, so stated: Secondary | ICD-10-CM | POA: Diagnosis not present

## 2020-03-09 DIAGNOSIS — S0993XA Unspecified injury of face, initial encounter: Secondary | ICD-10-CM | POA: Diagnosis not present

## 2020-03-09 DIAGNOSIS — Z6834 Body mass index (BMI) 34.0-34.9, adult: Secondary | ICD-10-CM | POA: Diagnosis not present

## 2020-03-09 DIAGNOSIS — Z8679 Personal history of other diseases of the circulatory system: Secondary | ICD-10-CM | POA: Diagnosis not present

## 2020-03-09 DIAGNOSIS — J431 Panlobular emphysema: Secondary | ICD-10-CM | POA: Diagnosis not present

## 2020-03-09 DIAGNOSIS — D631 Anemia in chronic kidney disease: Secondary | ICD-10-CM | POA: Diagnosis not present

## 2020-03-09 DIAGNOSIS — Q761 Klippel-Feil syndrome: Secondary | ICD-10-CM | POA: Diagnosis not present

## 2020-03-09 DIAGNOSIS — N5201 Erectile dysfunction due to arterial insufficiency: Secondary | ICD-10-CM | POA: Diagnosis not present

## 2020-03-09 DIAGNOSIS — N401 Enlarged prostate with lower urinary tract symptoms: Secondary | ICD-10-CM | POA: Diagnosis not present

## 2020-03-09 DIAGNOSIS — M5417 Radiculopathy, lumbosacral region: Secondary | ICD-10-CM | POA: Diagnosis not present

## 2020-03-09 DIAGNOSIS — M4854XD Collapsed vertebra, not elsewhere classified, thoracic region, subsequent encounter for fracture with routine healing: Secondary | ICD-10-CM | POA: Diagnosis not present

## 2020-03-09 DIAGNOSIS — Z466 Encounter for fitting and adjustment of urinary device: Secondary | ICD-10-CM | POA: Diagnosis not present

## 2020-03-09 DIAGNOSIS — Z5181 Encounter for therapeutic drug level monitoring: Secondary | ICD-10-CM | POA: Diagnosis not present

## 2020-03-09 DIAGNOSIS — R319 Hematuria, unspecified: Secondary | ICD-10-CM | POA: Diagnosis not present

## 2020-03-09 DIAGNOSIS — Z7982 Long term (current) use of aspirin: Secondary | ICD-10-CM | POA: Diagnosis not present

## 2020-03-09 DIAGNOSIS — J4520 Mild intermittent asthma, uncomplicated: Secondary | ICD-10-CM | POA: Diagnosis not present

## 2020-03-09 DIAGNOSIS — H59022 Cataract (lens) fragments in eye following cataract surgery, left eye: Secondary | ICD-10-CM | POA: Diagnosis not present

## 2020-03-09 DIAGNOSIS — E1151 Type 2 diabetes mellitus with diabetic peripheral angiopathy without gangrene: Secondary | ICD-10-CM | POA: Diagnosis not present

## 2020-03-09 DIAGNOSIS — L538 Other specified erythematous conditions: Secondary | ICD-10-CM | POA: Diagnosis not present

## 2020-03-09 DIAGNOSIS — R0789 Other chest pain: Secondary | ICD-10-CM | POA: Diagnosis not present

## 2020-03-09 DIAGNOSIS — H40032 Anatomical narrow angle, left eye: Secondary | ICD-10-CM | POA: Diagnosis not present

## 2020-03-09 DIAGNOSIS — N958 Other specified menopausal and perimenopausal disorders: Secondary | ICD-10-CM | POA: Diagnosis not present

## 2020-03-09 DIAGNOSIS — F410 Panic disorder [episodic paroxysmal anxiety] without agoraphobia: Secondary | ICD-10-CM | POA: Diagnosis not present

## 2020-03-09 DIAGNOSIS — R32 Unspecified urinary incontinence: Secondary | ICD-10-CM | POA: Diagnosis not present

## 2020-03-09 DIAGNOSIS — R609 Edema, unspecified: Secondary | ICD-10-CM | POA: Diagnosis not present

## 2020-03-09 DIAGNOSIS — N6312 Unspecified lump in the right breast, upper inner quadrant: Secondary | ICD-10-CM | POA: Diagnosis not present

## 2020-03-09 DIAGNOSIS — S7002XA Contusion of left hip, initial encounter: Secondary | ICD-10-CM | POA: Diagnosis not present

## 2020-03-09 DIAGNOSIS — L219 Seborrheic dermatitis, unspecified: Secondary | ICD-10-CM | POA: Diagnosis not present

## 2020-03-09 DIAGNOSIS — D123 Benign neoplasm of transverse colon: Secondary | ICD-10-CM | POA: Diagnosis not present

## 2020-03-09 DIAGNOSIS — L570 Actinic keratosis: Secondary | ICD-10-CM | POA: Diagnosis not present

## 2020-03-09 DIAGNOSIS — J438 Other emphysema: Secondary | ICD-10-CM | POA: Diagnosis not present

## 2020-03-09 DIAGNOSIS — M0579 Rheumatoid arthritis with rheumatoid factor of multiple sites without organ or systems involvement: Secondary | ICD-10-CM | POA: Diagnosis not present

## 2020-03-09 DIAGNOSIS — E663 Overweight: Secondary | ICD-10-CM | POA: Diagnosis not present

## 2020-03-09 DIAGNOSIS — N1330 Unspecified hydronephrosis: Secondary | ICD-10-CM | POA: Diagnosis not present

## 2020-03-09 DIAGNOSIS — S98112A Complete traumatic amputation of left great toe, initial encounter: Secondary | ICD-10-CM | POA: Diagnosis not present

## 2020-03-09 DIAGNOSIS — M9905 Segmental and somatic dysfunction of pelvic region: Secondary | ICD-10-CM | POA: Diagnosis not present

## 2020-03-09 DIAGNOSIS — M6281 Muscle weakness (generalized): Secondary | ICD-10-CM | POA: Diagnosis not present

## 2020-03-09 DIAGNOSIS — K4090 Unilateral inguinal hernia, without obstruction or gangrene, not specified as recurrent: Secondary | ICD-10-CM | POA: Diagnosis not present

## 2020-03-09 DIAGNOSIS — Z136 Encounter for screening for cardiovascular disorders: Secondary | ICD-10-CM | POA: Diagnosis not present

## 2020-03-09 DIAGNOSIS — Z905 Acquired absence of kidney: Secondary | ICD-10-CM | POA: Diagnosis not present

## 2020-03-09 DIAGNOSIS — I952 Hypotension due to drugs: Secondary | ICD-10-CM | POA: Diagnosis not present

## 2020-03-09 DIAGNOSIS — Z1231 Encounter for screening mammogram for malignant neoplasm of breast: Secondary | ICD-10-CM | POA: Diagnosis not present

## 2020-03-09 DIAGNOSIS — F1010 Alcohol abuse, uncomplicated: Secondary | ICD-10-CM | POA: Diagnosis not present

## 2020-03-09 DIAGNOSIS — Z91040 Latex allergy status: Secondary | ICD-10-CM | POA: Diagnosis not present

## 2020-03-09 DIAGNOSIS — M8580 Other specified disorders of bone density and structure, unspecified site: Secondary | ICD-10-CM | POA: Diagnosis not present

## 2020-03-09 DIAGNOSIS — M25651 Stiffness of right hip, not elsewhere classified: Secondary | ICD-10-CM | POA: Diagnosis not present

## 2020-03-09 DIAGNOSIS — N1831 Chronic kidney disease, stage 3a: Secondary | ICD-10-CM | POA: Diagnosis not present

## 2020-03-09 DIAGNOSIS — Z9189 Other specified personal risk factors, not elsewhere classified: Secondary | ICD-10-CM | POA: Diagnosis not present

## 2020-03-09 DIAGNOSIS — M21962 Unspecified acquired deformity of left lower leg: Secondary | ICD-10-CM | POA: Diagnosis not present

## 2020-03-09 DIAGNOSIS — I69398 Other sequelae of cerebral infarction: Secondary | ICD-10-CM | POA: Diagnosis not present

## 2020-03-09 DIAGNOSIS — I5032 Chronic diastolic (congestive) heart failure: Secondary | ICD-10-CM | POA: Diagnosis not present

## 2020-03-09 DIAGNOSIS — L918 Other hypertrophic disorders of the skin: Secondary | ICD-10-CM | POA: Diagnosis not present

## 2020-03-09 DIAGNOSIS — I110 Hypertensive heart disease with heart failure: Secondary | ICD-10-CM | POA: Diagnosis not present

## 2020-03-09 DIAGNOSIS — K567 Ileus, unspecified: Secondary | ICD-10-CM | POA: Diagnosis not present

## 2020-03-09 DIAGNOSIS — H0288A Meibomian gland dysfunction right eye, upper and lower eyelids: Secondary | ICD-10-CM | POA: Diagnosis not present

## 2020-03-10 DIAGNOSIS — M79674 Pain in right toe(s): Secondary | ICD-10-CM | POA: Diagnosis not present

## 2020-03-10 DIAGNOSIS — M75120 Complete rotator cuff tear or rupture of unspecified shoulder, not specified as traumatic: Secondary | ICD-10-CM | POA: Diagnosis not present

## 2020-03-10 DIAGNOSIS — S0502XA Injury of conjunctiva and corneal abrasion without foreign body, left eye, initial encounter: Secondary | ICD-10-CM | POA: Diagnosis not present

## 2020-03-10 DIAGNOSIS — G308 Other Alzheimer's disease: Secondary | ICD-10-CM | POA: Diagnosis not present

## 2020-03-10 DIAGNOSIS — D2272 Melanocytic nevi of left lower limb, including hip: Secondary | ICD-10-CM | POA: Diagnosis not present

## 2020-03-10 DIAGNOSIS — E1129 Type 2 diabetes mellitus with other diabetic kidney complication: Secondary | ICD-10-CM | POA: Diagnosis not present

## 2020-03-10 DIAGNOSIS — K869 Disease of pancreas, unspecified: Secondary | ICD-10-CM | POA: Diagnosis not present

## 2020-03-10 DIAGNOSIS — Z79891 Long term (current) use of opiate analgesic: Secondary | ICD-10-CM | POA: Diagnosis not present

## 2020-03-10 DIAGNOSIS — H353212 Exudative age-related macular degeneration, right eye, with inactive choroidal neovascularization: Secondary | ICD-10-CM | POA: Diagnosis not present

## 2020-03-10 DIAGNOSIS — R5383 Other fatigue: Secondary | ICD-10-CM | POA: Diagnosis not present

## 2020-03-10 DIAGNOSIS — I25119 Atherosclerotic heart disease of native coronary artery with unspecified angina pectoris: Secondary | ICD-10-CM | POA: Diagnosis not present

## 2020-03-10 DIAGNOSIS — E10649 Type 1 diabetes mellitus with hypoglycemia without coma: Secondary | ICD-10-CM | POA: Diagnosis not present

## 2020-03-10 DIAGNOSIS — C679 Malignant neoplasm of bladder, unspecified: Secondary | ICD-10-CM | POA: Diagnosis not present

## 2020-03-10 DIAGNOSIS — G35 Multiple sclerosis: Secondary | ICD-10-CM | POA: Diagnosis not present

## 2020-03-10 DIAGNOSIS — R5382 Chronic fatigue, unspecified: Secondary | ICD-10-CM | POA: Diagnosis not present

## 2020-03-10 DIAGNOSIS — L03032 Cellulitis of left toe: Secondary | ICD-10-CM | POA: Diagnosis not present

## 2020-03-10 DIAGNOSIS — I484 Atypical atrial flutter: Secondary | ICD-10-CM | POA: Diagnosis not present

## 2020-03-10 DIAGNOSIS — Z8042 Family history of malignant neoplasm of prostate: Secondary | ICD-10-CM | POA: Diagnosis not present

## 2020-03-10 DIAGNOSIS — J45902 Unspecified asthma with status asthmaticus: Secondary | ICD-10-CM | POA: Diagnosis not present

## 2020-03-10 DIAGNOSIS — I351 Nonrheumatic aortic (valve) insufficiency: Secondary | ICD-10-CM | POA: Diagnosis not present

## 2020-03-10 DIAGNOSIS — R0781 Pleurodynia: Secondary | ICD-10-CM | POA: Diagnosis not present

## 2020-03-10 DIAGNOSIS — Z91041 Radiographic dye allergy status: Secondary | ICD-10-CM | POA: Diagnosis not present

## 2020-03-10 DIAGNOSIS — Z6825 Body mass index (BMI) 25.0-25.9, adult: Secondary | ICD-10-CM | POA: Diagnosis not present

## 2020-03-10 DIAGNOSIS — L03119 Cellulitis of unspecified part of limb: Secondary | ICD-10-CM | POA: Diagnosis not present

## 2020-03-10 DIAGNOSIS — M7542 Impingement syndrome of left shoulder: Secondary | ICD-10-CM | POA: Diagnosis not present

## 2020-03-10 DIAGNOSIS — K7689 Other specified diseases of liver: Secondary | ICD-10-CM | POA: Diagnosis not present

## 2020-03-10 DIAGNOSIS — C44212 Basal cell carcinoma of skin of right ear and external auricular canal: Secondary | ICD-10-CM | POA: Diagnosis not present

## 2020-03-10 DIAGNOSIS — T184XXA Foreign body in colon, initial encounter: Secondary | ICD-10-CM | POA: Diagnosis not present

## 2020-03-10 DIAGNOSIS — M6281 Muscle weakness (generalized): Secondary | ICD-10-CM | POA: Diagnosis not present

## 2020-03-10 DIAGNOSIS — Z5181 Encounter for therapeutic drug level monitoring: Secondary | ICD-10-CM | POA: Diagnosis not present

## 2020-03-10 DIAGNOSIS — Z6841 Body Mass Index (BMI) 40.0 and over, adult: Secondary | ICD-10-CM | POA: Diagnosis not present

## 2020-03-10 DIAGNOSIS — F902 Attention-deficit hyperactivity disorder, combined type: Secondary | ICD-10-CM | POA: Diagnosis not present

## 2020-03-10 DIAGNOSIS — S42212A Unspecified displaced fracture of surgical neck of left humerus, initial encounter for closed fracture: Secondary | ICD-10-CM | POA: Diagnosis not present

## 2020-03-10 DIAGNOSIS — R1030 Lower abdominal pain, unspecified: Secondary | ICD-10-CM | POA: Diagnosis not present

## 2020-03-10 DIAGNOSIS — F419 Anxiety disorder, unspecified: Secondary | ICD-10-CM | POA: Diagnosis not present

## 2020-03-10 DIAGNOSIS — N3000 Acute cystitis without hematuria: Secondary | ICD-10-CM | POA: Diagnosis not present

## 2020-03-10 DIAGNOSIS — F1020 Alcohol dependence, uncomplicated: Secondary | ICD-10-CM | POA: Diagnosis not present

## 2020-03-10 DIAGNOSIS — E1136 Type 2 diabetes mellitus with diabetic cataract: Secondary | ICD-10-CM | POA: Diagnosis not present

## 2020-03-10 DIAGNOSIS — M79604 Pain in right leg: Secondary | ICD-10-CM | POA: Diagnosis not present

## 2020-03-10 DIAGNOSIS — D638 Anemia in other chronic diseases classified elsewhere: Secondary | ICD-10-CM | POA: Diagnosis not present

## 2020-03-10 DIAGNOSIS — Z87898 Personal history of other specified conditions: Secondary | ICD-10-CM | POA: Diagnosis not present

## 2020-03-10 DIAGNOSIS — C4491 Basal cell carcinoma of skin, unspecified: Secondary | ICD-10-CM | POA: Diagnosis not present

## 2020-03-10 DIAGNOSIS — I6782 Cerebral ischemia: Secondary | ICD-10-CM | POA: Diagnosis not present

## 2020-03-10 DIAGNOSIS — M7551 Bursitis of right shoulder: Secondary | ICD-10-CM | POA: Diagnosis not present

## 2020-03-10 DIAGNOSIS — C155 Malignant neoplasm of lower third of esophagus: Secondary | ICD-10-CM | POA: Diagnosis not present

## 2020-03-10 DIAGNOSIS — M8949 Other hypertrophic osteoarthropathy, multiple sites: Secondary | ICD-10-CM | POA: Diagnosis not present

## 2020-03-10 DIAGNOSIS — F332 Major depressive disorder, recurrent severe without psychotic features: Secondary | ICD-10-CM | POA: Diagnosis not present

## 2020-03-10 DIAGNOSIS — I420 Dilated cardiomyopathy: Secondary | ICD-10-CM | POA: Diagnosis not present

## 2020-03-10 DIAGNOSIS — Z820 Family history of epilepsy and other diseases of the nervous system: Secondary | ICD-10-CM | POA: Diagnosis not present

## 2020-03-10 DIAGNOSIS — N35912 Unspecified bulbous urethral stricture, male: Secondary | ICD-10-CM | POA: Diagnosis not present

## 2020-03-10 DIAGNOSIS — Z1331 Encounter for screening for depression: Secondary | ICD-10-CM | POA: Diagnosis not present

## 2020-03-10 DIAGNOSIS — D500 Iron deficiency anemia secondary to blood loss (chronic): Secondary | ICD-10-CM | POA: Diagnosis not present

## 2020-03-10 DIAGNOSIS — J301 Allergic rhinitis due to pollen: Secondary | ICD-10-CM | POA: Diagnosis not present

## 2020-03-10 DIAGNOSIS — Z954 Presence of other heart-valve replacement: Secondary | ICD-10-CM | POA: Diagnosis not present

## 2020-03-10 DIAGNOSIS — R6520 Severe sepsis without septic shock: Secondary | ICD-10-CM | POA: Diagnosis not present

## 2020-03-10 DIAGNOSIS — H353131 Nonexudative age-related macular degeneration, bilateral, early dry stage: Secondary | ICD-10-CM | POA: Diagnosis not present

## 2020-03-10 DIAGNOSIS — H5212 Myopia, left eye: Secondary | ICD-10-CM | POA: Diagnosis not present

## 2020-03-10 DIAGNOSIS — C9110 Chronic lymphocytic leukemia of B-cell type not having achieved remission: Secondary | ICD-10-CM | POA: Diagnosis not present

## 2020-03-10 DIAGNOSIS — I452 Bifascicular block: Secondary | ICD-10-CM | POA: Diagnosis not present

## 2020-03-10 DIAGNOSIS — M722 Plantar fascial fibromatosis: Secondary | ICD-10-CM | POA: Diagnosis not present

## 2020-03-10 DIAGNOSIS — Q8740 Marfan's syndrome, unspecified: Secondary | ICD-10-CM | POA: Diagnosis not present

## 2020-03-10 DIAGNOSIS — H524 Presbyopia: Secondary | ICD-10-CM | POA: Diagnosis not present

## 2020-03-10 DIAGNOSIS — H268 Other specified cataract: Secondary | ICD-10-CM | POA: Diagnosis not present

## 2020-03-10 DIAGNOSIS — H40059 Ocular hypertension, unspecified eye: Secondary | ICD-10-CM | POA: Diagnosis not present

## 2020-03-10 DIAGNOSIS — E1169 Type 2 diabetes mellitus with other specified complication: Secondary | ICD-10-CM | POA: Diagnosis not present

## 2020-03-10 DIAGNOSIS — E1122 Type 2 diabetes mellitus with diabetic chronic kidney disease: Secondary | ICD-10-CM | POA: Diagnosis not present

## 2020-03-10 DIAGNOSIS — K56600 Partial intestinal obstruction, unspecified as to cause: Secondary | ICD-10-CM | POA: Diagnosis not present

## 2020-03-10 DIAGNOSIS — R5381 Other malaise: Secondary | ICD-10-CM | POA: Diagnosis not present

## 2020-03-10 DIAGNOSIS — E7800 Pure hypercholesterolemia, unspecified: Secondary | ICD-10-CM | POA: Diagnosis not present

## 2020-03-10 DIAGNOSIS — R600 Localized edema: Secondary | ICD-10-CM | POA: Diagnosis not present

## 2020-03-10 DIAGNOSIS — C8590 Non-Hodgkin lymphoma, unspecified, unspecified site: Secondary | ICD-10-CM | POA: Diagnosis not present

## 2020-03-10 DIAGNOSIS — R188 Other ascites: Secondary | ICD-10-CM | POA: Diagnosis not present

## 2020-03-10 DIAGNOSIS — I083 Combined rheumatic disorders of mitral, aortic and tricuspid valves: Secondary | ICD-10-CM | POA: Diagnosis not present

## 2020-03-10 DIAGNOSIS — C8331 Diffuse large B-cell lymphoma, lymph nodes of head, face, and neck: Secondary | ICD-10-CM | POA: Diagnosis not present

## 2020-03-10 DIAGNOSIS — H35363 Drusen (degenerative) of macula, bilateral: Secondary | ICD-10-CM | POA: Diagnosis not present

## 2020-03-10 DIAGNOSIS — M4726 Other spondylosis with radiculopathy, lumbar region: Secondary | ICD-10-CM | POA: Diagnosis not present

## 2020-03-10 DIAGNOSIS — J310 Chronic rhinitis: Secondary | ICD-10-CM | POA: Diagnosis not present

## 2020-03-10 DIAGNOSIS — H02881 Meibomian gland dysfunction right upper eyelid: Secondary | ICD-10-CM | POA: Diagnosis not present

## 2020-03-10 DIAGNOSIS — E2839 Other primary ovarian failure: Secondary | ICD-10-CM | POA: Diagnosis not present

## 2020-03-10 DIAGNOSIS — Z7951 Long term (current) use of inhaled steroids: Secondary | ICD-10-CM | POA: Diagnosis not present

## 2020-03-10 DIAGNOSIS — Z681 Body mass index (BMI) 19 or less, adult: Secondary | ICD-10-CM | POA: Diagnosis not present

## 2020-03-10 DIAGNOSIS — M349 Systemic sclerosis, unspecified: Secondary | ICD-10-CM | POA: Diagnosis not present

## 2020-03-10 DIAGNOSIS — R0789 Other chest pain: Secondary | ICD-10-CM | POA: Diagnosis not present

## 2020-03-10 DIAGNOSIS — Z7984 Long term (current) use of oral hypoglycemic drugs: Secondary | ICD-10-CM | POA: Diagnosis not present

## 2020-03-10 DIAGNOSIS — Z124 Encounter for screening for malignant neoplasm of cervix: Secondary | ICD-10-CM | POA: Diagnosis not present

## 2020-03-10 DIAGNOSIS — R062 Wheezing: Secondary | ICD-10-CM | POA: Diagnosis not present

## 2020-03-10 DIAGNOSIS — Z8616 Personal history of COVID-19: Secondary | ICD-10-CM | POA: Diagnosis not present

## 2020-03-10 DIAGNOSIS — M25512 Pain in left shoulder: Secondary | ICD-10-CM | POA: Diagnosis not present

## 2020-03-10 DIAGNOSIS — J9811 Atelectasis: Secondary | ICD-10-CM | POA: Diagnosis not present

## 2020-03-10 DIAGNOSIS — D2271 Melanocytic nevi of right lower limb, including hip: Secondary | ICD-10-CM | POA: Diagnosis not present

## 2020-03-10 DIAGNOSIS — G629 Polyneuropathy, unspecified: Secondary | ICD-10-CM | POA: Diagnosis not present

## 2020-03-10 DIAGNOSIS — S0993XA Unspecified injury of face, initial encounter: Secondary | ICD-10-CM | POA: Diagnosis not present

## 2020-03-10 DIAGNOSIS — F324 Major depressive disorder, single episode, in partial remission: Secondary | ICD-10-CM | POA: Diagnosis not present

## 2020-03-10 DIAGNOSIS — R1033 Periumbilical pain: Secondary | ICD-10-CM | POA: Diagnosis not present

## 2020-03-10 DIAGNOSIS — L299 Pruritus, unspecified: Secondary | ICD-10-CM | POA: Diagnosis not present

## 2020-03-10 DIAGNOSIS — M8580 Other specified disorders of bone density and structure, unspecified site: Secondary | ICD-10-CM | POA: Diagnosis not present

## 2020-03-10 DIAGNOSIS — M25532 Pain in left wrist: Secondary | ICD-10-CM | POA: Diagnosis not present

## 2020-03-10 DIAGNOSIS — R748 Abnormal levels of other serum enzymes: Secondary | ICD-10-CM | POA: Diagnosis not present

## 2020-03-10 DIAGNOSIS — Z0000 Encounter for general adult medical examination without abnormal findings: Secondary | ICD-10-CM | POA: Diagnosis not present

## 2020-03-10 DIAGNOSIS — S6722XA Crushing injury of left hand, initial encounter: Secondary | ICD-10-CM | POA: Diagnosis not present

## 2020-03-10 DIAGNOSIS — K121 Other forms of stomatitis: Secondary | ICD-10-CM | POA: Diagnosis not present

## 2020-03-10 DIAGNOSIS — C499 Malignant neoplasm of connective and soft tissue, unspecified: Secondary | ICD-10-CM | POA: Diagnosis not present

## 2020-03-10 DIAGNOSIS — K5730 Diverticulosis of large intestine without perforation or abscess without bleeding: Secondary | ICD-10-CM | POA: Diagnosis not present

## 2020-03-10 DIAGNOSIS — M859 Disorder of bone density and structure, unspecified: Secondary | ICD-10-CM | POA: Diagnosis not present

## 2020-03-10 DIAGNOSIS — R0902 Hypoxemia: Secondary | ICD-10-CM | POA: Diagnosis not present

## 2020-03-10 DIAGNOSIS — N5089 Other specified disorders of the male genital organs: Secondary | ICD-10-CM | POA: Diagnosis not present

## 2020-03-10 DIAGNOSIS — H2102 Hyphema, left eye: Secondary | ICD-10-CM | POA: Diagnosis not present

## 2020-03-10 DIAGNOSIS — H1045 Other chronic allergic conjunctivitis: Secondary | ICD-10-CM | POA: Diagnosis not present

## 2020-03-10 DIAGNOSIS — Z1231 Encounter for screening mammogram for malignant neoplasm of breast: Secondary | ICD-10-CM | POA: Diagnosis not present

## 2020-03-10 DIAGNOSIS — S59901A Unspecified injury of right elbow, initial encounter: Secondary | ICD-10-CM | POA: Diagnosis not present

## 2020-03-10 DIAGNOSIS — I509 Heart failure, unspecified: Secondary | ICD-10-CM | POA: Diagnosis not present

## 2020-03-10 DIAGNOSIS — Z5111 Encounter for antineoplastic chemotherapy: Secondary | ICD-10-CM | POA: Diagnosis not present

## 2020-03-10 DIAGNOSIS — Z923 Personal history of irradiation: Secondary | ICD-10-CM | POA: Diagnosis not present

## 2020-03-10 DIAGNOSIS — F330 Major depressive disorder, recurrent, mild: Secondary | ICD-10-CM | POA: Diagnosis not present

## 2020-03-10 DIAGNOSIS — N6489 Other specified disorders of breast: Secondary | ICD-10-CM | POA: Diagnosis not present

## 2020-03-10 DIAGNOSIS — Z1339 Encounter for screening examination for other mental health and behavioral disorders: Secondary | ICD-10-CM | POA: Diagnosis not present

## 2020-03-10 DIAGNOSIS — I472 Ventricular tachycardia: Secondary | ICD-10-CM | POA: Diagnosis not present

## 2020-03-10 DIAGNOSIS — G8194 Hemiplegia, unspecified affecting left nondominant side: Secondary | ICD-10-CM | POA: Diagnosis not present

## 2020-03-10 DIAGNOSIS — J9621 Acute and chronic respiratory failure with hypoxia: Secondary | ICD-10-CM | POA: Diagnosis not present

## 2020-03-10 DIAGNOSIS — N400 Enlarged prostate without lower urinary tract symptoms: Secondary | ICD-10-CM | POA: Diagnosis not present

## 2020-03-10 DIAGNOSIS — Z880 Allergy status to penicillin: Secondary | ICD-10-CM | POA: Diagnosis not present

## 2020-03-10 DIAGNOSIS — M25622 Stiffness of left elbow, not elsewhere classified: Secondary | ICD-10-CM | POA: Diagnosis not present

## 2020-03-10 DIAGNOSIS — I788 Other diseases of capillaries: Secondary | ICD-10-CM | POA: Diagnosis not present

## 2020-03-10 DIAGNOSIS — Z96641 Presence of right artificial hip joint: Secondary | ICD-10-CM | POA: Diagnosis not present

## 2020-03-10 DIAGNOSIS — K2970 Gastritis, unspecified, without bleeding: Secondary | ICD-10-CM | POA: Diagnosis not present

## 2020-03-10 DIAGNOSIS — D62 Acute posthemorrhagic anemia: Secondary | ICD-10-CM | POA: Diagnosis not present

## 2020-03-10 DIAGNOSIS — E109 Type 1 diabetes mellitus without complications: Secondary | ICD-10-CM | POA: Diagnosis not present

## 2020-03-10 DIAGNOSIS — M216X9 Other acquired deformities of unspecified foot: Secondary | ICD-10-CM | POA: Diagnosis not present

## 2020-03-10 DIAGNOSIS — H5202 Hypermetropia, left eye: Secondary | ICD-10-CM | POA: Diagnosis not present

## 2020-03-10 DIAGNOSIS — R112 Nausea with vomiting, unspecified: Secondary | ICD-10-CM | POA: Diagnosis not present

## 2020-03-10 DIAGNOSIS — D125 Benign neoplasm of sigmoid colon: Secondary | ICD-10-CM | POA: Diagnosis not present

## 2020-03-10 DIAGNOSIS — M25661 Stiffness of right knee, not elsewhere classified: Secondary | ICD-10-CM | POA: Diagnosis not present

## 2020-03-10 DIAGNOSIS — D72829 Elevated white blood cell count, unspecified: Secondary | ICD-10-CM | POA: Diagnosis not present

## 2020-03-10 DIAGNOSIS — H40019 Open angle with borderline findings, low risk, unspecified eye: Secondary | ICD-10-CM | POA: Diagnosis not present

## 2020-03-10 DIAGNOSIS — R635 Abnormal weight gain: Secondary | ICD-10-CM | POA: Diagnosis not present

## 2020-03-10 DIAGNOSIS — B370 Candidal stomatitis: Secondary | ICD-10-CM | POA: Diagnosis not present

## 2020-03-10 DIAGNOSIS — M19079 Primary osteoarthritis, unspecified ankle and foot: Secondary | ICD-10-CM | POA: Diagnosis not present

## 2020-03-10 DIAGNOSIS — Z961 Presence of intraocular lens: Secondary | ICD-10-CM | POA: Diagnosis not present

## 2020-03-10 DIAGNOSIS — H43812 Vitreous degeneration, left eye: Secondary | ICD-10-CM | POA: Diagnosis not present

## 2020-03-10 DIAGNOSIS — Z9013 Acquired absence of bilateral breasts and nipples: Secondary | ICD-10-CM | POA: Diagnosis not present

## 2020-03-10 DIAGNOSIS — S0285XS Fracture of orbit, unspecified, sequela: Secondary | ICD-10-CM | POA: Diagnosis not present

## 2020-03-10 DIAGNOSIS — M65331 Trigger finger, right middle finger: Secondary | ICD-10-CM | POA: Diagnosis not present

## 2020-03-10 DIAGNOSIS — K5641 Fecal impaction: Secondary | ICD-10-CM | POA: Diagnosis not present

## 2020-03-10 DIAGNOSIS — E519 Thiamine deficiency, unspecified: Secondary | ICD-10-CM | POA: Diagnosis not present

## 2020-03-10 DIAGNOSIS — F0280 Dementia in other diseases classified elsewhere without behavioral disturbance: Secondary | ICD-10-CM | POA: Diagnosis not present

## 2020-03-10 DIAGNOSIS — I5020 Unspecified systolic (congestive) heart failure: Secondary | ICD-10-CM | POA: Diagnosis not present

## 2020-03-10 DIAGNOSIS — H25043 Posterior subcapsular polar age-related cataract, bilateral: Secondary | ICD-10-CM | POA: Diagnosis not present

## 2020-03-10 DIAGNOSIS — L920 Granuloma annulare: Secondary | ICD-10-CM | POA: Diagnosis not present

## 2020-03-10 DIAGNOSIS — M48061 Spinal stenosis, lumbar region without neurogenic claudication: Secondary | ICD-10-CM | POA: Diagnosis not present

## 2020-03-10 DIAGNOSIS — Z01812 Encounter for preprocedural laboratory examination: Secondary | ICD-10-CM | POA: Diagnosis not present

## 2020-03-10 DIAGNOSIS — J45909 Unspecified asthma, uncomplicated: Secondary | ICD-10-CM | POA: Diagnosis not present

## 2020-03-10 DIAGNOSIS — G8111 Spastic hemiplegia affecting right dominant side: Secondary | ICD-10-CM | POA: Diagnosis not present

## 2020-03-10 DIAGNOSIS — D123 Benign neoplasm of transverse colon: Secondary | ICD-10-CM | POA: Diagnosis not present

## 2020-03-10 DIAGNOSIS — M2540 Effusion, unspecified joint: Secondary | ICD-10-CM | POA: Diagnosis not present

## 2020-03-10 DIAGNOSIS — K449 Diaphragmatic hernia without obstruction or gangrene: Secondary | ICD-10-CM | POA: Diagnosis not present

## 2020-03-10 DIAGNOSIS — M8438XD Stress fracture, other site, subsequent encounter for fracture with routine healing: Secondary | ICD-10-CM | POA: Diagnosis not present

## 2020-03-10 DIAGNOSIS — E041 Nontoxic single thyroid nodule: Secondary | ICD-10-CM | POA: Diagnosis not present

## 2020-03-10 DIAGNOSIS — Z932 Ileostomy status: Secondary | ICD-10-CM | POA: Diagnosis not present

## 2020-03-10 DIAGNOSIS — Z09 Encounter for follow-up examination after completed treatment for conditions other than malignant neoplasm: Secondary | ICD-10-CM | POA: Diagnosis not present

## 2020-03-10 DIAGNOSIS — R1110 Vomiting, unspecified: Secondary | ICD-10-CM | POA: Diagnosis not present

## 2020-03-10 DIAGNOSIS — M170 Bilateral primary osteoarthritis of knee: Secondary | ICD-10-CM | POA: Diagnosis not present

## 2020-03-10 DIAGNOSIS — M25812 Other specified joint disorders, left shoulder: Secondary | ICD-10-CM | POA: Diagnosis not present

## 2020-03-10 DIAGNOSIS — M67969 Unspecified disorder of synovium and tendon, unspecified lower leg: Secondary | ICD-10-CM | POA: Diagnosis not present

## 2020-03-10 DIAGNOSIS — E034 Atrophy of thyroid (acquired): Secondary | ICD-10-CM | POA: Diagnosis not present

## 2020-03-10 DIAGNOSIS — C9001 Multiple myeloma in remission: Secondary | ICD-10-CM | POA: Diagnosis not present

## 2020-03-10 DIAGNOSIS — D508 Other iron deficiency anemias: Secondary | ICD-10-CM | POA: Diagnosis not present

## 2020-03-10 DIAGNOSIS — H0288B Meibomian gland dysfunction left eye, upper and lower eyelids: Secondary | ICD-10-CM | POA: Diagnosis not present

## 2020-03-10 DIAGNOSIS — M542 Cervicalgia: Secondary | ICD-10-CM | POA: Diagnosis not present

## 2020-03-10 DIAGNOSIS — Z1151 Encounter for screening for human papillomavirus (HPV): Secondary | ICD-10-CM | POA: Diagnosis not present

## 2020-03-10 DIAGNOSIS — R9439 Abnormal result of other cardiovascular function study: Secondary | ICD-10-CM | POA: Diagnosis not present

## 2020-03-10 DIAGNOSIS — L400 Psoriasis vulgaris: Secondary | ICD-10-CM | POA: Diagnosis not present

## 2020-03-10 DIAGNOSIS — N2581 Secondary hyperparathyroidism of renal origin: Secondary | ICD-10-CM | POA: Diagnosis not present

## 2020-03-10 DIAGNOSIS — I4891 Unspecified atrial fibrillation: Secondary | ICD-10-CM | POA: Diagnosis not present

## 2020-03-10 DIAGNOSIS — K8020 Calculus of gallbladder without cholecystitis without obstruction: Secondary | ICD-10-CM | POA: Diagnosis not present

## 2020-03-10 DIAGNOSIS — E875 Hyperkalemia: Secondary | ICD-10-CM | POA: Diagnosis not present

## 2020-03-10 DIAGNOSIS — I63213 Cerebral infarction due to unspecified occlusion or stenosis of bilateral vertebral arteries: Secondary | ICD-10-CM | POA: Diagnosis not present

## 2020-03-10 DIAGNOSIS — H02831 Dermatochalasis of right upper eyelid: Secondary | ICD-10-CM | POA: Diagnosis not present

## 2020-03-10 DIAGNOSIS — M4125 Other idiopathic scoliosis, thoracolumbar region: Secondary | ICD-10-CM | POA: Diagnosis not present

## 2020-03-10 DIAGNOSIS — I69398 Other sequelae of cerebral infarction: Secondary | ICD-10-CM | POA: Diagnosis not present

## 2020-03-10 DIAGNOSIS — C186 Malignant neoplasm of descending colon: Secondary | ICD-10-CM | POA: Diagnosis not present

## 2020-03-10 DIAGNOSIS — M79672 Pain in left foot: Secondary | ICD-10-CM | POA: Diagnosis not present

## 2020-03-10 DIAGNOSIS — Z8719 Personal history of other diseases of the digestive system: Secondary | ICD-10-CM | POA: Diagnosis not present

## 2020-03-10 DIAGNOSIS — C7801 Secondary malignant neoplasm of right lung: Secondary | ICD-10-CM | POA: Diagnosis not present

## 2020-03-10 DIAGNOSIS — N200 Calculus of kidney: Secondary | ICD-10-CM | POA: Diagnosis not present

## 2020-03-10 DIAGNOSIS — G936 Cerebral edema: Secondary | ICD-10-CM | POA: Diagnosis not present

## 2020-03-10 DIAGNOSIS — F319 Bipolar disorder, unspecified: Secondary | ICD-10-CM | POA: Diagnosis not present

## 2020-03-10 DIAGNOSIS — Z85038 Personal history of other malignant neoplasm of large intestine: Secondary | ICD-10-CM | POA: Diagnosis not present

## 2020-03-10 DIAGNOSIS — F17200 Nicotine dependence, unspecified, uncomplicated: Secondary | ICD-10-CM | POA: Diagnosis not present

## 2020-03-10 DIAGNOSIS — L218 Other seborrheic dermatitis: Secondary | ICD-10-CM | POA: Diagnosis not present

## 2020-03-10 DIAGNOSIS — R3129 Other microscopic hematuria: Secondary | ICD-10-CM | POA: Diagnosis not present

## 2020-03-10 DIAGNOSIS — F331 Major depressive disorder, recurrent, moderate: Secondary | ICD-10-CM | POA: Diagnosis not present

## 2020-03-10 DIAGNOSIS — Z760 Encounter for issue of repeat prescription: Secondary | ICD-10-CM | POA: Diagnosis not present

## 2020-03-10 DIAGNOSIS — M549 Dorsalgia, unspecified: Secondary | ICD-10-CM | POA: Diagnosis not present

## 2020-03-10 DIAGNOSIS — F3341 Major depressive disorder, recurrent, in partial remission: Secondary | ICD-10-CM | POA: Diagnosis not present

## 2020-03-10 DIAGNOSIS — H02135 Senile ectropion of left lower eyelid: Secondary | ICD-10-CM | POA: Diagnosis not present

## 2020-03-10 DIAGNOSIS — S42201D Unspecified fracture of upper end of right humerus, subsequent encounter for fracture with routine healing: Secondary | ICD-10-CM | POA: Diagnosis not present

## 2020-03-10 DIAGNOSIS — J410 Simple chronic bronchitis: Secondary | ICD-10-CM | POA: Diagnosis not present

## 2020-03-10 DIAGNOSIS — K3184 Gastroparesis: Secondary | ICD-10-CM | POA: Diagnosis not present

## 2020-03-10 DIAGNOSIS — R918 Other nonspecific abnormal finding of lung field: Secondary | ICD-10-CM | POA: Diagnosis not present

## 2020-03-10 DIAGNOSIS — R778 Other specified abnormalities of plasma proteins: Secondary | ICD-10-CM | POA: Diagnosis not present

## 2020-03-10 DIAGNOSIS — R05 Cough: Secondary | ICD-10-CM | POA: Diagnosis not present

## 2020-03-10 DIAGNOSIS — M4716 Other spondylosis with myelopathy, lumbar region: Secondary | ICD-10-CM | POA: Diagnosis not present

## 2020-03-10 DIAGNOSIS — R590 Localized enlarged lymph nodes: Secondary | ICD-10-CM | POA: Diagnosis not present

## 2020-03-10 DIAGNOSIS — Z794 Long term (current) use of insulin: Secondary | ICD-10-CM | POA: Diagnosis not present

## 2020-03-10 DIAGNOSIS — Z6834 Body mass index (BMI) 34.0-34.9, adult: Secondary | ICD-10-CM | POA: Diagnosis not present

## 2020-03-10 DIAGNOSIS — I5041 Acute combined systolic (congestive) and diastolic (congestive) heart failure: Secondary | ICD-10-CM | POA: Diagnosis not present

## 2020-03-10 DIAGNOSIS — R001 Bradycardia, unspecified: Secondary | ICD-10-CM | POA: Diagnosis not present

## 2020-03-10 DIAGNOSIS — N3281 Overactive bladder: Secondary | ICD-10-CM | POA: Diagnosis not present

## 2020-03-10 DIAGNOSIS — Z01419 Encounter for gynecological examination (general) (routine) without abnormal findings: Secondary | ICD-10-CM | POA: Diagnosis not present

## 2020-03-10 DIAGNOSIS — M1A9XX0 Chronic gout, unspecified, without tophus (tophi): Secondary | ICD-10-CM | POA: Diagnosis not present

## 2020-03-10 DIAGNOSIS — R58 Hemorrhage, not elsewhere classified: Secondary | ICD-10-CM | POA: Diagnosis not present

## 2020-03-10 DIAGNOSIS — R911 Solitary pulmonary nodule: Secondary | ICD-10-CM | POA: Diagnosis not present

## 2020-03-10 DIAGNOSIS — R7881 Bacteremia: Secondary | ICD-10-CM | POA: Diagnosis not present

## 2020-03-10 DIAGNOSIS — M62551 Muscle wasting and atrophy, not elsewhere classified, right thigh: Secondary | ICD-10-CM | POA: Diagnosis not present

## 2020-03-10 DIAGNOSIS — I493 Ventricular premature depolarization: Secondary | ICD-10-CM | POA: Diagnosis not present

## 2020-03-10 DIAGNOSIS — L603 Nail dystrophy: Secondary | ICD-10-CM | POA: Diagnosis not present

## 2020-03-10 DIAGNOSIS — R29898 Other symptoms and signs involving the musculoskeletal system: Secondary | ICD-10-CM | POA: Diagnosis not present

## 2020-03-10 DIAGNOSIS — M5136 Other intervertebral disc degeneration, lumbar region: Secondary | ICD-10-CM | POA: Diagnosis not present

## 2020-03-10 DIAGNOSIS — M1712 Unilateral primary osteoarthritis, left knee: Secondary | ICD-10-CM | POA: Diagnosis not present

## 2020-03-10 DIAGNOSIS — I1310 Hypertensive heart and chronic kidney disease without heart failure, with stage 1 through stage 4 chronic kidney disease, or unspecified chronic kidney disease: Secondary | ICD-10-CM | POA: Diagnosis not present

## 2020-03-10 DIAGNOSIS — G5623 Lesion of ulnar nerve, bilateral upper limbs: Secondary | ICD-10-CM | POA: Diagnosis not present

## 2020-03-10 DIAGNOSIS — I447 Left bundle-branch block, unspecified: Secondary | ICD-10-CM | POA: Diagnosis not present

## 2020-03-10 DIAGNOSIS — K900 Celiac disease: Secondary | ICD-10-CM | POA: Diagnosis not present

## 2020-03-10 DIAGNOSIS — E663 Overweight: Secondary | ICD-10-CM | POA: Diagnosis not present

## 2020-03-10 DIAGNOSIS — I051 Rheumatic mitral insufficiency: Secondary | ICD-10-CM | POA: Diagnosis not present

## 2020-03-10 DIAGNOSIS — Z6829 Body mass index (BMI) 29.0-29.9, adult: Secondary | ICD-10-CM | POA: Diagnosis not present

## 2020-03-10 DIAGNOSIS — N132 Hydronephrosis with renal and ureteral calculous obstruction: Secondary | ICD-10-CM | POA: Diagnosis not present

## 2020-03-10 DIAGNOSIS — N4889 Other specified disorders of penis: Secondary | ICD-10-CM | POA: Diagnosis not present

## 2020-03-10 DIAGNOSIS — G301 Alzheimer's disease with late onset: Secondary | ICD-10-CM | POA: Diagnosis not present

## 2020-03-10 DIAGNOSIS — M810 Age-related osteoporosis without current pathological fracture: Secondary | ICD-10-CM | POA: Diagnosis not present

## 2020-03-10 DIAGNOSIS — Z451 Encounter for adjustment and management of infusion pump: Secondary | ICD-10-CM | POA: Diagnosis not present

## 2020-03-10 DIAGNOSIS — L723 Sebaceous cyst: Secondary | ICD-10-CM | POA: Diagnosis not present

## 2020-03-10 DIAGNOSIS — J9601 Acute respiratory failure with hypoxia: Secondary | ICD-10-CM | POA: Diagnosis not present

## 2020-03-10 DIAGNOSIS — S8012XA Contusion of left lower leg, initial encounter: Secondary | ICD-10-CM | POA: Diagnosis not present

## 2020-03-10 DIAGNOSIS — Z01810 Encounter for preprocedural cardiovascular examination: Secondary | ICD-10-CM | POA: Diagnosis not present

## 2020-03-10 DIAGNOSIS — G894 Chronic pain syndrome: Secondary | ICD-10-CM | POA: Diagnosis not present

## 2020-03-10 DIAGNOSIS — Z8709 Personal history of other diseases of the respiratory system: Secondary | ICD-10-CM | POA: Diagnosis not present

## 2020-03-10 DIAGNOSIS — D225 Melanocytic nevi of trunk: Secondary | ICD-10-CM | POA: Diagnosis not present

## 2020-03-10 DIAGNOSIS — I130 Hypertensive heart and chronic kidney disease with heart failure and stage 1 through stage 4 chronic kidney disease, or unspecified chronic kidney disease: Secondary | ICD-10-CM | POA: Diagnosis not present

## 2020-03-10 DIAGNOSIS — H25042 Posterior subcapsular polar age-related cataract, left eye: Secondary | ICD-10-CM | POA: Diagnosis not present

## 2020-03-10 DIAGNOSIS — R6881 Early satiety: Secondary | ICD-10-CM | POA: Diagnosis not present

## 2020-03-10 DIAGNOSIS — Z471 Aftercare following joint replacement surgery: Secondary | ICD-10-CM | POA: Diagnosis not present

## 2020-03-10 DIAGNOSIS — D4620 Refractory anemia with excess of blasts, unspecified: Secondary | ICD-10-CM | POA: Diagnosis not present

## 2020-03-10 DIAGNOSIS — M25561 Pain in right knee: Secondary | ICD-10-CM | POA: Diagnosis not present

## 2020-03-10 DIAGNOSIS — Z113 Encounter for screening for infections with a predominantly sexual mode of transmission: Secondary | ICD-10-CM | POA: Diagnosis not present

## 2020-03-10 DIAGNOSIS — H539 Unspecified visual disturbance: Secondary | ICD-10-CM | POA: Diagnosis not present

## 2020-03-10 DIAGNOSIS — R3912 Poor urinary stream: Secondary | ICD-10-CM | POA: Diagnosis not present

## 2020-03-10 DIAGNOSIS — M2550 Pain in unspecified joint: Secondary | ICD-10-CM | POA: Diagnosis not present

## 2020-03-10 DIAGNOSIS — N138 Other obstructive and reflux uropathy: Secondary | ICD-10-CM | POA: Diagnosis not present

## 2020-03-10 DIAGNOSIS — I69354 Hemiplegia and hemiparesis following cerebral infarction affecting left non-dominant side: Secondary | ICD-10-CM | POA: Diagnosis not present

## 2020-03-10 DIAGNOSIS — F429 Obsessive-compulsive disorder, unspecified: Secondary | ICD-10-CM | POA: Diagnosis not present

## 2020-03-10 DIAGNOSIS — R220 Localized swelling, mass and lump, head: Secondary | ICD-10-CM | POA: Diagnosis not present

## 2020-03-10 DIAGNOSIS — K5909 Other constipation: Secondary | ICD-10-CM | POA: Diagnosis not present

## 2020-03-10 DIAGNOSIS — Z1329 Encounter for screening for other suspected endocrine disorder: Secondary | ICD-10-CM | POA: Diagnosis not present

## 2020-03-10 DIAGNOSIS — C541 Malignant neoplasm of endometrium: Secondary | ICD-10-CM | POA: Diagnosis not present

## 2020-03-10 DIAGNOSIS — M79675 Pain in left toe(s): Secondary | ICD-10-CM | POA: Diagnosis not present

## 2020-03-10 DIAGNOSIS — N184 Chronic kidney disease, stage 4 (severe): Secondary | ICD-10-CM | POA: Diagnosis not present

## 2020-03-10 DIAGNOSIS — G609 Hereditary and idiopathic neuropathy, unspecified: Secondary | ICD-10-CM | POA: Diagnosis not present

## 2020-03-10 DIAGNOSIS — J479 Bronchiectasis, uncomplicated: Secondary | ICD-10-CM | POA: Diagnosis not present

## 2020-03-10 DIAGNOSIS — E559 Vitamin D deficiency, unspecified: Secondary | ICD-10-CM | POA: Diagnosis not present

## 2020-03-10 DIAGNOSIS — G500 Trigeminal neuralgia: Secondary | ICD-10-CM | POA: Diagnosis not present

## 2020-03-10 DIAGNOSIS — L718 Other rosacea: Secondary | ICD-10-CM | POA: Diagnosis not present

## 2020-03-10 DIAGNOSIS — Z9221 Personal history of antineoplastic chemotherapy: Secondary | ICD-10-CM | POA: Diagnosis not present

## 2020-03-10 DIAGNOSIS — J438 Other emphysema: Secondary | ICD-10-CM | POA: Diagnosis not present

## 2020-03-10 DIAGNOSIS — F0390 Unspecified dementia without behavioral disturbance: Secondary | ICD-10-CM | POA: Diagnosis not present

## 2020-03-10 DIAGNOSIS — E042 Nontoxic multinodular goiter: Secondary | ICD-10-CM | POA: Diagnosis not present

## 2020-03-10 DIAGNOSIS — F439 Reaction to severe stress, unspecified: Secondary | ICD-10-CM | POA: Diagnosis not present

## 2020-03-10 DIAGNOSIS — M47817 Spondylosis without myelopathy or radiculopathy, lumbosacral region: Secondary | ICD-10-CM | POA: Diagnosis not present

## 2020-03-10 DIAGNOSIS — G3184 Mild cognitive impairment, so stated: Secondary | ICD-10-CM | POA: Diagnosis not present

## 2020-03-10 DIAGNOSIS — E7849 Other hyperlipidemia: Secondary | ICD-10-CM | POA: Diagnosis not present

## 2020-03-10 DIAGNOSIS — M4807 Spinal stenosis, lumbosacral region: Secondary | ICD-10-CM | POA: Diagnosis not present

## 2020-03-10 DIAGNOSIS — R202 Paresthesia of skin: Secondary | ICD-10-CM | POA: Diagnosis not present

## 2020-03-10 DIAGNOSIS — M25511 Pain in right shoulder: Secondary | ICD-10-CM | POA: Diagnosis not present

## 2020-03-10 DIAGNOSIS — D485 Neoplasm of uncertain behavior of skin: Secondary | ICD-10-CM | POA: Diagnosis not present

## 2020-03-10 DIAGNOSIS — M1000 Idiopathic gout, unspecified site: Secondary | ICD-10-CM | POA: Diagnosis not present

## 2020-03-10 DIAGNOSIS — K529 Noninfective gastroenteritis and colitis, unspecified: Secondary | ICD-10-CM | POA: Diagnosis not present

## 2020-03-10 DIAGNOSIS — M1991 Primary osteoarthritis, unspecified site: Secondary | ICD-10-CM | POA: Diagnosis not present

## 2020-03-10 DIAGNOSIS — M67911 Unspecified disorder of synovium and tendon, right shoulder: Secondary | ICD-10-CM | POA: Diagnosis not present

## 2020-03-10 DIAGNOSIS — M8589 Other specified disorders of bone density and structure, multiple sites: Secondary | ICD-10-CM | POA: Diagnosis not present

## 2020-03-10 DIAGNOSIS — E1159 Type 2 diabetes mellitus with other circulatory complications: Secondary | ICD-10-CM | POA: Diagnosis not present

## 2020-03-10 DIAGNOSIS — Z96651 Presence of right artificial knee joint: Secondary | ICD-10-CM | POA: Diagnosis not present

## 2020-03-10 DIAGNOSIS — E440 Moderate protein-calorie malnutrition: Secondary | ICD-10-CM | POA: Diagnosis not present

## 2020-03-10 DIAGNOSIS — L814 Other melanin hyperpigmentation: Secondary | ICD-10-CM | POA: Diagnosis not present

## 2020-03-10 DIAGNOSIS — A4181 Sepsis due to Enterococcus: Secondary | ICD-10-CM | POA: Diagnosis not present

## 2020-03-10 DIAGNOSIS — Z950 Presence of cardiac pacemaker: Secondary | ICD-10-CM | POA: Diagnosis not present

## 2020-03-10 DIAGNOSIS — Q398 Other congenital malformations of esophagus: Secondary | ICD-10-CM | POA: Diagnosis not present

## 2020-03-10 DIAGNOSIS — I8311 Varicose veins of right lower extremity with inflammation: Secondary | ICD-10-CM | POA: Diagnosis not present

## 2020-03-10 DIAGNOSIS — F17210 Nicotine dependence, cigarettes, uncomplicated: Secondary | ICD-10-CM | POA: Diagnosis not present

## 2020-03-10 DIAGNOSIS — Z9181 History of falling: Secondary | ICD-10-CM | POA: Diagnosis not present

## 2020-03-10 DIAGNOSIS — J9610 Chronic respiratory failure, unspecified whether with hypoxia or hypercapnia: Secondary | ICD-10-CM | POA: Diagnosis not present

## 2020-03-10 DIAGNOSIS — K641 Second degree hemorrhoids: Secondary | ICD-10-CM | POA: Diagnosis not present

## 2020-03-10 DIAGNOSIS — E1149 Type 2 diabetes mellitus with other diabetic neurological complication: Secondary | ICD-10-CM | POA: Diagnosis not present

## 2020-03-10 DIAGNOSIS — R7303 Prediabetes: Secondary | ICD-10-CM | POA: Diagnosis not present

## 2020-03-10 DIAGNOSIS — J4540 Moderate persistent asthma, uncomplicated: Secondary | ICD-10-CM | POA: Diagnosis not present

## 2020-03-10 DIAGNOSIS — M316 Other giant cell arteritis: Secondary | ICD-10-CM | POA: Diagnosis not present

## 2020-03-10 DIAGNOSIS — M0609 Rheumatoid arthritis without rheumatoid factor, multiple sites: Secondary | ICD-10-CM | POA: Diagnosis not present

## 2020-03-10 DIAGNOSIS — M25611 Stiffness of right shoulder, not elsewhere classified: Secondary | ICD-10-CM | POA: Diagnosis not present

## 2020-03-10 DIAGNOSIS — M79609 Pain in unspecified limb: Secondary | ICD-10-CM | POA: Diagnosis not present

## 2020-03-10 DIAGNOSIS — C439 Malignant melanoma of skin, unspecified: Secondary | ICD-10-CM | POA: Diagnosis not present

## 2020-03-10 DIAGNOSIS — I83813 Varicose veins of bilateral lower extremities with pain: Secondary | ICD-10-CM | POA: Diagnosis not present

## 2020-03-10 DIAGNOSIS — L905 Scar conditions and fibrosis of skin: Secondary | ICD-10-CM | POA: Diagnosis not present

## 2020-03-10 DIAGNOSIS — Z131 Encounter for screening for diabetes mellitus: Secondary | ICD-10-CM | POA: Diagnosis not present

## 2020-03-10 DIAGNOSIS — H547 Unspecified visual loss: Secondary | ICD-10-CM | POA: Diagnosis not present

## 2020-03-10 DIAGNOSIS — K760 Fatty (change of) liver, not elsewhere classified: Secondary | ICD-10-CM | POA: Diagnosis not present

## 2020-03-10 DIAGNOSIS — T189XXA Foreign body of alimentary tract, part unspecified, initial encounter: Secondary | ICD-10-CM | POA: Diagnosis not present

## 2020-03-10 DIAGNOSIS — S299XXA Unspecified injury of thorax, initial encounter: Secondary | ICD-10-CM | POA: Diagnosis not present

## 2020-03-10 DIAGNOSIS — D696 Thrombocytopenia, unspecified: Secondary | ICD-10-CM | POA: Diagnosis not present

## 2020-03-10 DIAGNOSIS — H532 Diplopia: Secondary | ICD-10-CM | POA: Diagnosis not present

## 2020-03-10 DIAGNOSIS — Z48816 Encounter for surgical aftercare following surgery on the genitourinary system: Secondary | ICD-10-CM | POA: Diagnosis not present

## 2020-03-10 DIAGNOSIS — I671 Cerebral aneurysm, nonruptured: Secondary | ICD-10-CM | POA: Diagnosis not present

## 2020-03-10 DIAGNOSIS — G9009 Other idiopathic peripheral autonomic neuropathy: Secondary | ICD-10-CM | POA: Diagnosis not present

## 2020-03-10 DIAGNOSIS — K591 Functional diarrhea: Secondary | ICD-10-CM | POA: Diagnosis not present

## 2020-03-10 DIAGNOSIS — I4892 Unspecified atrial flutter: Secondary | ICD-10-CM | POA: Diagnosis not present

## 2020-03-10 DIAGNOSIS — K089 Disorder of teeth and supporting structures, unspecified: Secondary | ICD-10-CM | POA: Diagnosis not present

## 2020-03-10 DIAGNOSIS — F0281 Dementia in other diseases classified elsewhere with behavioral disturbance: Secondary | ICD-10-CM | POA: Diagnosis not present

## 2020-03-10 DIAGNOSIS — M5431 Sciatica, right side: Secondary | ICD-10-CM | POA: Diagnosis not present

## 2020-03-10 DIAGNOSIS — M79661 Pain in right lower leg: Secondary | ICD-10-CM | POA: Diagnosis not present

## 2020-03-10 DIAGNOSIS — R0982 Postnasal drip: Secondary | ICD-10-CM | POA: Diagnosis not present

## 2020-03-10 DIAGNOSIS — J302 Other seasonal allergic rhinitis: Secondary | ICD-10-CM | POA: Diagnosis not present

## 2020-03-10 DIAGNOSIS — T8459XD Infection and inflammatory reaction due to other internal joint prosthesis, subsequent encounter: Secondary | ICD-10-CM | POA: Diagnosis not present

## 2020-03-10 DIAGNOSIS — Z85820 Personal history of malignant melanoma of skin: Secondary | ICD-10-CM | POA: Diagnosis not present

## 2020-03-10 DIAGNOSIS — I959 Hypotension, unspecified: Secondary | ICD-10-CM | POA: Diagnosis not present

## 2020-03-10 DIAGNOSIS — H6122 Impacted cerumen, left ear: Secondary | ICD-10-CM | POA: Diagnosis not present

## 2020-03-10 DIAGNOSIS — Z96642 Presence of left artificial hip joint: Secondary | ICD-10-CM | POA: Diagnosis not present

## 2020-03-10 DIAGNOSIS — H16143 Punctate keratitis, bilateral: Secondary | ICD-10-CM | POA: Diagnosis not present

## 2020-03-10 DIAGNOSIS — M79631 Pain in right forearm: Secondary | ICD-10-CM | POA: Diagnosis not present

## 2020-03-10 DIAGNOSIS — Z0184 Encounter for antibody response examination: Secondary | ICD-10-CM | POA: Diagnosis not present

## 2020-03-10 DIAGNOSIS — A6000 Herpesviral infection of urogenital system, unspecified: Secondary | ICD-10-CM | POA: Diagnosis not present

## 2020-03-10 DIAGNOSIS — Z08 Encounter for follow-up examination after completed treatment for malignant neoplasm: Secondary | ICD-10-CM | POA: Diagnosis not present

## 2020-03-10 DIAGNOSIS — J309 Allergic rhinitis, unspecified: Secondary | ICD-10-CM | POA: Diagnosis not present

## 2020-03-10 DIAGNOSIS — B349 Viral infection, unspecified: Secondary | ICD-10-CM | POA: Diagnosis not present

## 2020-03-10 DIAGNOSIS — D61818 Other pancytopenia: Secondary | ICD-10-CM | POA: Diagnosis not present

## 2020-03-10 DIAGNOSIS — M8588 Other specified disorders of bone density and structure, other site: Secondary | ICD-10-CM | POA: Diagnosis not present

## 2020-03-10 DIAGNOSIS — S70361A Insect bite (nonvenomous), right thigh, initial encounter: Secondary | ICD-10-CM | POA: Diagnosis not present

## 2020-03-10 DIAGNOSIS — Z833 Family history of diabetes mellitus: Secondary | ICD-10-CM | POA: Diagnosis not present

## 2020-03-10 DIAGNOSIS — Z8546 Personal history of malignant neoplasm of prostate: Secondary | ICD-10-CM | POA: Diagnosis not present

## 2020-03-10 DIAGNOSIS — H26492 Other secondary cataract, left eye: Secondary | ICD-10-CM | POA: Diagnosis not present

## 2020-03-10 DIAGNOSIS — I5033 Acute on chronic diastolic (congestive) heart failure: Secondary | ICD-10-CM | POA: Diagnosis not present

## 2020-03-10 DIAGNOSIS — M5441 Lumbago with sciatica, right side: Secondary | ICD-10-CM | POA: Diagnosis not present

## 2020-03-10 DIAGNOSIS — I480 Paroxysmal atrial fibrillation: Secondary | ICD-10-CM | POA: Diagnosis not present

## 2020-03-10 DIAGNOSIS — Z800 Family history of malignant neoplasm of digestive organs: Secondary | ICD-10-CM | POA: Diagnosis not present

## 2020-03-10 DIAGNOSIS — E890 Postprocedural hypothyroidism: Secondary | ICD-10-CM | POA: Diagnosis not present

## 2020-03-10 DIAGNOSIS — Z8551 Personal history of malignant neoplasm of bladder: Secondary | ICD-10-CM | POA: Diagnosis not present

## 2020-03-10 DIAGNOSIS — C8305 Small cell B-cell lymphoma, lymph nodes of inguinal region and lower limb: Secondary | ICD-10-CM | POA: Diagnosis not present

## 2020-03-10 DIAGNOSIS — H4901 Third [oculomotor] nerve palsy, right eye: Secondary | ICD-10-CM | POA: Diagnosis not present

## 2020-03-10 DIAGNOSIS — Z85118 Personal history of other malignant neoplasm of bronchus and lung: Secondary | ICD-10-CM | POA: Diagnosis not present

## 2020-03-10 DIAGNOSIS — I350 Nonrheumatic aortic (valve) stenosis: Secondary | ICD-10-CM | POA: Diagnosis not present

## 2020-03-10 DIAGNOSIS — S42209A Unspecified fracture of upper end of unspecified humerus, initial encounter for closed fracture: Secondary | ICD-10-CM | POA: Diagnosis not present

## 2020-03-10 DIAGNOSIS — R1310 Dysphagia, unspecified: Secondary | ICD-10-CM | POA: Diagnosis not present

## 2020-03-10 DIAGNOSIS — G4730 Sleep apnea, unspecified: Secondary | ICD-10-CM | POA: Diagnosis not present

## 2020-03-10 DIAGNOSIS — C7931 Secondary malignant neoplasm of brain: Secondary | ICD-10-CM | POA: Diagnosis not present

## 2020-03-10 DIAGNOSIS — G8929 Other chronic pain: Secondary | ICD-10-CM | POA: Diagnosis not present

## 2020-03-10 DIAGNOSIS — R42 Dizziness and giddiness: Secondary | ICD-10-CM | POA: Diagnosis not present

## 2020-03-10 DIAGNOSIS — F5101 Primary insomnia: Secondary | ICD-10-CM | POA: Diagnosis not present

## 2020-03-10 DIAGNOSIS — Z8589 Personal history of malignant neoplasm of other organs and systems: Secondary | ICD-10-CM | POA: Diagnosis not present

## 2020-03-10 DIAGNOSIS — S3992XA Unspecified injury of lower back, initial encounter: Secondary | ICD-10-CM | POA: Diagnosis not present

## 2020-03-10 DIAGNOSIS — R634 Abnormal weight loss: Secondary | ICD-10-CM | POA: Diagnosis not present

## 2020-03-10 DIAGNOSIS — R599 Enlarged lymph nodes, unspecified: Secondary | ICD-10-CM | POA: Diagnosis not present

## 2020-03-10 DIAGNOSIS — E039 Hypothyroidism, unspecified: Secondary | ICD-10-CM | POA: Diagnosis not present

## 2020-03-10 DIAGNOSIS — E860 Dehydration: Secondary | ICD-10-CM | POA: Diagnosis not present

## 2020-03-10 DIAGNOSIS — I70203 Unspecified atherosclerosis of native arteries of extremities, bilateral legs: Secondary | ICD-10-CM | POA: Diagnosis not present

## 2020-03-10 DIAGNOSIS — M797 Fibromyalgia: Secondary | ICD-10-CM | POA: Diagnosis not present

## 2020-03-10 DIAGNOSIS — I63511 Cerebral infarction due to unspecified occlusion or stenosis of right middle cerebral artery: Secondary | ICD-10-CM | POA: Diagnosis not present

## 2020-03-10 DIAGNOSIS — Z483 Aftercare following surgery for neoplasm: Secondary | ICD-10-CM | POA: Diagnosis not present

## 2020-03-10 DIAGNOSIS — Z4682 Encounter for fitting and adjustment of non-vascular catheter: Secondary | ICD-10-CM | POA: Diagnosis not present

## 2020-03-10 DIAGNOSIS — E569 Vitamin deficiency, unspecified: Secondary | ICD-10-CM | POA: Diagnosis not present

## 2020-03-10 DIAGNOSIS — H2512 Age-related nuclear cataract, left eye: Secondary | ICD-10-CM | POA: Diagnosis not present

## 2020-03-10 DIAGNOSIS — G5621 Lesion of ulnar nerve, right upper limb: Secondary | ICD-10-CM | POA: Diagnosis not present

## 2020-03-10 DIAGNOSIS — N3090 Cystitis, unspecified without hematuria: Secondary | ICD-10-CM | POA: Diagnosis not present

## 2020-03-10 DIAGNOSIS — Z87891 Personal history of nicotine dependence: Secondary | ICD-10-CM | POA: Diagnosis not present

## 2020-03-10 DIAGNOSIS — H35372 Puckering of macula, left eye: Secondary | ICD-10-CM | POA: Diagnosis not present

## 2020-03-10 DIAGNOSIS — Z96612 Presence of left artificial shoulder joint: Secondary | ICD-10-CM | POA: Diagnosis not present

## 2020-03-10 DIAGNOSIS — I951 Orthostatic hypotension: Secondary | ICD-10-CM | POA: Diagnosis not present

## 2020-03-10 DIAGNOSIS — M4327 Fusion of spine, lumbosacral region: Secondary | ICD-10-CM | POA: Diagnosis not present

## 2020-03-10 DIAGNOSIS — I119 Hypertensive heart disease without heart failure: Secondary | ICD-10-CM | POA: Diagnosis not present

## 2020-03-10 DIAGNOSIS — D120 Benign neoplasm of cecum: Secondary | ICD-10-CM | POA: Diagnosis not present

## 2020-03-10 DIAGNOSIS — M4802 Spinal stenosis, cervical region: Secondary | ICD-10-CM | POA: Diagnosis not present

## 2020-03-10 DIAGNOSIS — M19011 Primary osteoarthritis, right shoulder: Secondary | ICD-10-CM | POA: Diagnosis not present

## 2020-03-10 DIAGNOSIS — Z77090 Contact with and (suspected) exposure to asbestos: Secondary | ICD-10-CM | POA: Diagnosis not present

## 2020-03-10 DIAGNOSIS — H53022 Refractive amblyopia, left eye: Secondary | ICD-10-CM | POA: Diagnosis not present

## 2020-03-10 DIAGNOSIS — M545 Low back pain: Secondary | ICD-10-CM | POA: Diagnosis not present

## 2020-03-10 DIAGNOSIS — R2689 Other abnormalities of gait and mobility: Secondary | ICD-10-CM | POA: Diagnosis not present

## 2020-03-10 DIAGNOSIS — M130 Polyarthritis, unspecified: Secondary | ICD-10-CM | POA: Diagnosis not present

## 2020-03-10 DIAGNOSIS — R262 Difficulty in walking, not elsewhere classified: Secondary | ICD-10-CM | POA: Diagnosis not present

## 2020-03-10 DIAGNOSIS — L02619 Cutaneous abscess of unspecified foot: Secondary | ICD-10-CM | POA: Diagnosis not present

## 2020-03-10 DIAGNOSIS — E785 Hyperlipidemia, unspecified: Secondary | ICD-10-CM | POA: Diagnosis not present

## 2020-03-10 DIAGNOSIS — R531 Weakness: Secondary | ICD-10-CM | POA: Diagnosis not present

## 2020-03-10 DIAGNOSIS — J3089 Other allergic rhinitis: Secondary | ICD-10-CM | POA: Diagnosis not present

## 2020-03-10 DIAGNOSIS — J4520 Mild intermittent asthma, uncomplicated: Secondary | ICD-10-CM | POA: Diagnosis not present

## 2020-03-10 DIAGNOSIS — M150 Primary generalized (osteo)arthritis: Secondary | ICD-10-CM | POA: Diagnosis not present

## 2020-03-10 DIAGNOSIS — I248 Other forms of acute ischemic heart disease: Secondary | ICD-10-CM | POA: Diagnosis not present

## 2020-03-10 DIAGNOSIS — R410 Disorientation, unspecified: Secondary | ICD-10-CM | POA: Diagnosis not present

## 2020-03-10 DIAGNOSIS — M19071 Primary osteoarthritis, right ankle and foot: Secondary | ICD-10-CM | POA: Diagnosis not present

## 2020-03-10 DIAGNOSIS — M4186 Other forms of scoliosis, lumbar region: Secondary | ICD-10-CM | POA: Diagnosis not present

## 2020-03-10 DIAGNOSIS — I739 Peripheral vascular disease, unspecified: Secondary | ICD-10-CM | POA: Diagnosis not present

## 2020-03-10 DIAGNOSIS — H43811 Vitreous degeneration, right eye: Secondary | ICD-10-CM | POA: Diagnosis not present

## 2020-03-10 DIAGNOSIS — K640 First degree hemorrhoids: Secondary | ICD-10-CM | POA: Diagnosis not present

## 2020-03-10 DIAGNOSIS — Z951 Presence of aortocoronary bypass graft: Secondary | ICD-10-CM | POA: Diagnosis not present

## 2020-03-10 DIAGNOSIS — H34213 Partial retinal artery occlusion, bilateral: Secondary | ICD-10-CM | POA: Diagnosis not present

## 2020-03-10 DIAGNOSIS — K225 Diverticulum of esophagus, acquired: Secondary | ICD-10-CM | POA: Diagnosis not present

## 2020-03-10 DIAGNOSIS — H04123 Dry eye syndrome of bilateral lacrimal glands: Secondary | ICD-10-CM | POA: Diagnosis not present

## 2020-03-10 DIAGNOSIS — K5901 Slow transit constipation: Secondary | ICD-10-CM | POA: Diagnosis not present

## 2020-03-10 DIAGNOSIS — B079 Viral wart, unspecified: Secondary | ICD-10-CM | POA: Diagnosis not present

## 2020-03-10 DIAGNOSIS — H4089 Other specified glaucoma: Secondary | ICD-10-CM | POA: Diagnosis not present

## 2020-03-10 DIAGNOSIS — S0990XA Unspecified injury of head, initial encounter: Secondary | ICD-10-CM | POA: Diagnosis not present

## 2020-03-10 DIAGNOSIS — K5790 Diverticulosis of intestine, part unspecified, without perforation or abscess without bleeding: Secondary | ICD-10-CM | POA: Diagnosis not present

## 2020-03-10 DIAGNOSIS — N492 Inflammatory disorders of scrotum: Secondary | ICD-10-CM | POA: Diagnosis not present

## 2020-03-10 DIAGNOSIS — G479 Sleep disorder, unspecified: Secondary | ICD-10-CM | POA: Diagnosis not present

## 2020-03-10 DIAGNOSIS — L089 Local infection of the skin and subcutaneous tissue, unspecified: Secondary | ICD-10-CM | POA: Diagnosis not present

## 2020-03-10 DIAGNOSIS — H9190 Unspecified hearing loss, unspecified ear: Secondary | ICD-10-CM | POA: Diagnosis not present

## 2020-03-10 DIAGNOSIS — R946 Abnormal results of thyroid function studies: Secondary | ICD-10-CM | POA: Diagnosis not present

## 2020-03-10 DIAGNOSIS — I4819 Other persistent atrial fibrillation: Secondary | ICD-10-CM | POA: Diagnosis not present

## 2020-03-10 DIAGNOSIS — Z122 Encounter for screening for malignant neoplasm of respiratory organs: Secondary | ICD-10-CM | POA: Diagnosis not present

## 2020-03-10 DIAGNOSIS — M47816 Spondylosis without myelopathy or radiculopathy, lumbar region: Secondary | ICD-10-CM | POA: Diagnosis not present

## 2020-03-10 DIAGNOSIS — S0592XS Unspecified injury of left eye and orbit, sequela: Secondary | ICD-10-CM | POA: Diagnosis not present

## 2020-03-10 DIAGNOSIS — N489 Disorder of penis, unspecified: Secondary | ICD-10-CM | POA: Diagnosis not present

## 2020-03-10 DIAGNOSIS — Z9981 Dependence on supplemental oxygen: Secondary | ICD-10-CM | POA: Diagnosis not present

## 2020-03-10 DIAGNOSIS — D751 Secondary polycythemia: Secondary | ICD-10-CM | POA: Diagnosis not present

## 2020-03-10 DIAGNOSIS — R1013 Epigastric pain: Secondary | ICD-10-CM | POA: Diagnosis not present

## 2020-03-10 DIAGNOSIS — G5603 Carpal tunnel syndrome, bilateral upper limbs: Secondary | ICD-10-CM | POA: Diagnosis not present

## 2020-03-10 DIAGNOSIS — Z79811 Long term (current) use of aromatase inhibitors: Secondary | ICD-10-CM | POA: Diagnosis not present

## 2020-03-10 DIAGNOSIS — H2513 Age-related nuclear cataract, bilateral: Secondary | ICD-10-CM | POA: Diagnosis not present

## 2020-03-10 DIAGNOSIS — F322 Major depressive disorder, single episode, severe without psychotic features: Secondary | ICD-10-CM | POA: Diagnosis not present

## 2020-03-10 DIAGNOSIS — M65312 Trigger thumb, left thumb: Secondary | ICD-10-CM | POA: Diagnosis not present

## 2020-03-10 DIAGNOSIS — S39012A Strain of muscle, fascia and tendon of lower back, initial encounter: Secondary | ICD-10-CM | POA: Diagnosis not present

## 2020-03-10 DIAGNOSIS — M961 Postlaminectomy syndrome, not elsewhere classified: Secondary | ICD-10-CM | POA: Diagnosis not present

## 2020-03-10 DIAGNOSIS — H6123 Impacted cerumen, bilateral: Secondary | ICD-10-CM | POA: Diagnosis not present

## 2020-03-10 DIAGNOSIS — S79912A Unspecified injury of left hip, initial encounter: Secondary | ICD-10-CM | POA: Diagnosis not present

## 2020-03-10 DIAGNOSIS — Z886 Allergy status to analgesic agent status: Secondary | ICD-10-CM | POA: Diagnosis not present

## 2020-03-10 DIAGNOSIS — J42 Unspecified chronic bronchitis: Secondary | ICD-10-CM | POA: Diagnosis not present

## 2020-03-10 DIAGNOSIS — R64 Cachexia: Secondary | ICD-10-CM | POA: Diagnosis not present

## 2020-03-10 DIAGNOSIS — H26491 Other secondary cataract, right eye: Secondary | ICD-10-CM | POA: Diagnosis not present

## 2020-03-10 DIAGNOSIS — M9905 Segmental and somatic dysfunction of pelvic region: Secondary | ICD-10-CM | POA: Diagnosis not present

## 2020-03-10 DIAGNOSIS — M85852 Other specified disorders of bone density and structure, left thigh: Secondary | ICD-10-CM | POA: Diagnosis not present

## 2020-03-10 DIAGNOSIS — M5020 Other cervical disc displacement, unspecified cervical region: Secondary | ICD-10-CM | POA: Diagnosis not present

## 2020-03-10 DIAGNOSIS — M419 Scoliosis, unspecified: Secondary | ICD-10-CM | POA: Diagnosis not present

## 2020-03-10 DIAGNOSIS — H8110 Benign paroxysmal vertigo, unspecified ear: Secondary | ICD-10-CM | POA: Diagnosis not present

## 2020-03-10 DIAGNOSIS — J441 Chronic obstructive pulmonary disease with (acute) exacerbation: Secondary | ICD-10-CM | POA: Diagnosis not present

## 2020-03-10 DIAGNOSIS — Z823 Family history of stroke: Secondary | ICD-10-CM | POA: Diagnosis not present

## 2020-03-10 DIAGNOSIS — E876 Hypokalemia: Secondary | ICD-10-CM | POA: Diagnosis not present

## 2020-03-10 DIAGNOSIS — L309 Dermatitis, unspecified: Secondary | ICD-10-CM | POA: Diagnosis not present

## 2020-03-10 DIAGNOSIS — L02612 Cutaneous abscess of left foot: Secondary | ICD-10-CM | POA: Diagnosis not present

## 2020-03-10 DIAGNOSIS — M791 Myalgia, unspecified site: Secondary | ICD-10-CM | POA: Diagnosis not present

## 2020-03-10 DIAGNOSIS — J1282 Pneumonia due to coronavirus disease 2019: Secondary | ICD-10-CM | POA: Diagnosis not present

## 2020-03-10 DIAGNOSIS — Z8673 Personal history of transient ischemic attack (TIA), and cerebral infarction without residual deficits: Secondary | ICD-10-CM | POA: Diagnosis not present

## 2020-03-10 DIAGNOSIS — E038 Other specified hypothyroidism: Secondary | ICD-10-CM | POA: Diagnosis not present

## 2020-03-10 DIAGNOSIS — M7550 Bursitis of unspecified shoulder: Secondary | ICD-10-CM | POA: Diagnosis not present

## 2020-03-10 DIAGNOSIS — H40023 Open angle with borderline findings, high risk, bilateral: Secondary | ICD-10-CM | POA: Diagnosis not present

## 2020-03-10 DIAGNOSIS — E46 Unspecified protein-calorie malnutrition: Secondary | ICD-10-CM | POA: Diagnosis not present

## 2020-03-10 DIAGNOSIS — H25013 Cortical age-related cataract, bilateral: Secondary | ICD-10-CM | POA: Diagnosis not present

## 2020-03-10 DIAGNOSIS — S72142D Displaced intertrochanteric fracture of left femur, subsequent encounter for closed fracture with routine healing: Secondary | ICD-10-CM | POA: Diagnosis not present

## 2020-03-10 DIAGNOSIS — H353123 Nonexudative age-related macular degeneration, left eye, advanced atrophic without subfoveal involvement: Secondary | ICD-10-CM | POA: Diagnosis not present

## 2020-03-10 DIAGNOSIS — Z6833 Body mass index (BMI) 33.0-33.9, adult: Secondary | ICD-10-CM | POA: Diagnosis not present

## 2020-03-10 DIAGNOSIS — H353112 Nonexudative age-related macular degeneration, right eye, intermediate dry stage: Secondary | ICD-10-CM | POA: Diagnosis not present

## 2020-03-10 DIAGNOSIS — D2262 Melanocytic nevi of left upper limb, including shoulder: Secondary | ICD-10-CM | POA: Diagnosis not present

## 2020-03-10 DIAGNOSIS — I5022 Chronic systolic (congestive) heart failure: Secondary | ICD-10-CM | POA: Diagnosis not present

## 2020-03-10 DIAGNOSIS — H25811 Combined forms of age-related cataract, right eye: Secondary | ICD-10-CM | POA: Diagnosis not present

## 2020-03-10 DIAGNOSIS — R197 Diarrhea, unspecified: Secondary | ICD-10-CM | POA: Diagnosis not present

## 2020-03-10 DIAGNOSIS — K633 Ulcer of intestine: Secondary | ICD-10-CM | POA: Diagnosis not present

## 2020-03-10 DIAGNOSIS — M4800 Spinal stenosis, site unspecified: Secondary | ICD-10-CM | POA: Diagnosis not present

## 2020-03-10 DIAGNOSIS — M3500 Sicca syndrome, unspecified: Secondary | ICD-10-CM | POA: Diagnosis not present

## 2020-03-10 DIAGNOSIS — L209 Atopic dermatitis, unspecified: Secondary | ICD-10-CM | POA: Diagnosis not present

## 2020-03-10 DIAGNOSIS — Z9009 Acquired absence of other part of head and neck: Secondary | ICD-10-CM | POA: Diagnosis not present

## 2020-03-10 DIAGNOSIS — K582 Mixed irritable bowel syndrome: Secondary | ICD-10-CM | POA: Diagnosis not present

## 2020-03-10 DIAGNOSIS — M5416 Radiculopathy, lumbar region: Secondary | ICD-10-CM | POA: Diagnosis not present

## 2020-03-10 DIAGNOSIS — R070 Pain in throat: Secondary | ICD-10-CM | POA: Diagnosis not present

## 2020-03-10 DIAGNOSIS — G8918 Other acute postprocedural pain: Secondary | ICD-10-CM | POA: Diagnosis not present

## 2020-03-10 DIAGNOSIS — E781 Pure hyperglyceridemia: Secondary | ICD-10-CM | POA: Diagnosis not present

## 2020-03-10 DIAGNOSIS — E7489 Other specified disorders of carbohydrate metabolism: Secondary | ICD-10-CM | POA: Diagnosis not present

## 2020-03-10 DIAGNOSIS — I359 Nonrheumatic aortic valve disorder, unspecified: Secondary | ICD-10-CM | POA: Diagnosis not present

## 2020-03-10 DIAGNOSIS — C61 Malignant neoplasm of prostate: Secondary | ICD-10-CM | POA: Diagnosis not present

## 2020-03-10 DIAGNOSIS — H43813 Vitreous degeneration, bilateral: Secondary | ICD-10-CM | POA: Diagnosis not present

## 2020-03-10 DIAGNOSIS — M79602 Pain in left arm: Secondary | ICD-10-CM | POA: Diagnosis not present

## 2020-03-10 DIAGNOSIS — M7072 Other bursitis of hip, left hip: Secondary | ICD-10-CM | POA: Diagnosis not present

## 2020-03-10 DIAGNOSIS — R490 Dysphonia: Secondary | ICD-10-CM | POA: Diagnosis not present

## 2020-03-10 DIAGNOSIS — Z9989 Dependence on other enabling machines and devices: Secondary | ICD-10-CM | POA: Diagnosis not present

## 2020-03-10 DIAGNOSIS — R002 Palpitations: Secondary | ICD-10-CM | POA: Diagnosis not present

## 2020-03-10 DIAGNOSIS — R0602 Shortness of breath: Secondary | ICD-10-CM | POA: Diagnosis not present

## 2020-03-10 DIAGNOSIS — Z6835 Body mass index (BMI) 35.0-35.9, adult: Secondary | ICD-10-CM | POA: Diagnosis not present

## 2020-03-10 DIAGNOSIS — R933 Abnormal findings on diagnostic imaging of other parts of digestive tract: Secondary | ICD-10-CM | POA: Diagnosis not present

## 2020-03-10 DIAGNOSIS — J984 Other disorders of lung: Secondary | ICD-10-CM | POA: Diagnosis not present

## 2020-03-10 DIAGNOSIS — I63212 Cerebral infarction due to unspecified occlusion or stenosis of left vertebral arteries: Secondary | ICD-10-CM | POA: Diagnosis not present

## 2020-03-10 DIAGNOSIS — M19171 Post-traumatic osteoarthritis, right ankle and foot: Secondary | ICD-10-CM | POA: Diagnosis not present

## 2020-03-10 DIAGNOSIS — H01005 Unspecified blepharitis left lower eyelid: Secondary | ICD-10-CM | POA: Diagnosis not present

## 2020-03-10 DIAGNOSIS — H59031 Cystoid macular edema following cataract surgery, right eye: Secondary | ICD-10-CM | POA: Diagnosis not present

## 2020-03-10 DIAGNOSIS — Z712 Person consulting for explanation of examination or test findings: Secondary | ICD-10-CM | POA: Diagnosis not present

## 2020-03-10 DIAGNOSIS — I25118 Atherosclerotic heart disease of native coronary artery with other forms of angina pectoris: Secondary | ICD-10-CM | POA: Diagnosis not present

## 2020-03-10 DIAGNOSIS — R279 Unspecified lack of coordination: Secondary | ICD-10-CM | POA: Diagnosis not present

## 2020-03-10 DIAGNOSIS — D0511 Intraductal carcinoma in situ of right breast: Secondary | ICD-10-CM | POA: Diagnosis not present

## 2020-03-10 DIAGNOSIS — M25551 Pain in right hip: Secondary | ICD-10-CM | POA: Diagnosis not present

## 2020-03-10 DIAGNOSIS — M75101 Unspecified rotator cuff tear or rupture of right shoulder, not specified as traumatic: Secondary | ICD-10-CM | POA: Diagnosis not present

## 2020-03-10 DIAGNOSIS — E1140 Type 2 diabetes mellitus with diabetic neuropathy, unspecified: Secondary | ICD-10-CM | POA: Diagnosis not present

## 2020-03-10 DIAGNOSIS — D329 Benign neoplasm of meninges, unspecified: Secondary | ICD-10-CM | POA: Diagnosis not present

## 2020-03-10 DIAGNOSIS — D631 Anemia in chronic kidney disease: Secondary | ICD-10-CM | POA: Diagnosis not present

## 2020-03-10 DIAGNOSIS — M25562 Pain in left knee: Secondary | ICD-10-CM | POA: Diagnosis not present

## 2020-03-10 DIAGNOSIS — M1990 Unspecified osteoarthritis, unspecified site: Secondary | ICD-10-CM | POA: Diagnosis not present

## 2020-03-10 DIAGNOSIS — I5042 Chronic combined systolic (congestive) and diastolic (congestive) heart failure: Secondary | ICD-10-CM | POA: Diagnosis not present

## 2020-03-10 DIAGNOSIS — H9193 Unspecified hearing loss, bilateral: Secondary | ICD-10-CM | POA: Diagnosis not present

## 2020-03-10 DIAGNOSIS — J208 Acute bronchitis due to other specified organisms: Secondary | ICD-10-CM | POA: Diagnosis not present

## 2020-03-10 DIAGNOSIS — Z596 Low income: Secondary | ICD-10-CM | POA: Diagnosis not present

## 2020-03-10 DIAGNOSIS — M5442 Lumbago with sciatica, left side: Secondary | ICD-10-CM | POA: Diagnosis not present

## 2020-03-10 DIAGNOSIS — C50311 Malignant neoplasm of lower-inner quadrant of right female breast: Secondary | ICD-10-CM | POA: Diagnosis not present

## 2020-03-10 DIAGNOSIS — R82998 Other abnormal findings in urine: Secondary | ICD-10-CM | POA: Diagnosis not present

## 2020-03-10 DIAGNOSIS — J40 Bronchitis, not specified as acute or chronic: Secondary | ICD-10-CM | POA: Diagnosis not present

## 2020-03-10 DIAGNOSIS — I313 Pericardial effusion (noninflammatory): Secondary | ICD-10-CM | POA: Diagnosis not present

## 2020-03-10 DIAGNOSIS — S0101XD Laceration without foreign body of scalp, subsequent encounter: Secondary | ICD-10-CM | POA: Diagnosis not present

## 2020-03-10 DIAGNOSIS — Z791 Long term (current) use of non-steroidal anti-inflammatories (NSAID): Secondary | ICD-10-CM | POA: Diagnosis not present

## 2020-03-10 DIAGNOSIS — E079 Disorder of thyroid, unspecified: Secondary | ICD-10-CM | POA: Diagnosis not present

## 2020-03-10 DIAGNOSIS — Z95828 Presence of other vascular implants and grafts: Secondary | ICD-10-CM | POA: Diagnosis not present

## 2020-03-10 DIAGNOSIS — W57XXXA Bitten or stung by nonvenomous insect and other nonvenomous arthropods, initial encounter: Secondary | ICD-10-CM | POA: Diagnosis not present

## 2020-03-10 DIAGNOSIS — D2261 Melanocytic nevi of right upper limb, including shoulder: Secondary | ICD-10-CM | POA: Diagnosis not present

## 2020-03-10 DIAGNOSIS — Z136 Encounter for screening for cardiovascular disorders: Secondary | ICD-10-CM | POA: Diagnosis not present

## 2020-03-10 DIAGNOSIS — Z780 Asymptomatic menopausal state: Secondary | ICD-10-CM | POA: Diagnosis not present

## 2020-03-10 DIAGNOSIS — I716 Thoracoabdominal aortic aneurysm, without rupture: Secondary | ICD-10-CM | POA: Diagnosis not present

## 2020-03-10 DIAGNOSIS — R1903 Right lower quadrant abdominal swelling, mass and lump: Secondary | ICD-10-CM | POA: Diagnosis not present

## 2020-03-10 DIAGNOSIS — E441 Mild protein-calorie malnutrition: Secondary | ICD-10-CM | POA: Diagnosis not present

## 2020-03-10 DIAGNOSIS — S72002D Fracture of unspecified part of neck of left femur, subsequent encounter for closed fracture with routine healing: Secondary | ICD-10-CM | POA: Diagnosis not present

## 2020-03-10 DIAGNOSIS — R030 Elevated blood-pressure reading, without diagnosis of hypertension: Secondary | ICD-10-CM | POA: Diagnosis not present

## 2020-03-10 DIAGNOSIS — L84 Corns and callosities: Secondary | ICD-10-CM | POA: Diagnosis not present

## 2020-03-10 DIAGNOSIS — M79671 Pain in right foot: Secondary | ICD-10-CM | POA: Diagnosis not present

## 2020-03-10 DIAGNOSIS — N2889 Other specified disorders of kidney and ureter: Secondary | ICD-10-CM | POA: Diagnosis not present

## 2020-03-10 DIAGNOSIS — E669 Obesity, unspecified: Secondary | ICD-10-CM | POA: Diagnosis not present

## 2020-03-10 DIAGNOSIS — M9902 Segmental and somatic dysfunction of thoracic region: Secondary | ICD-10-CM | POA: Diagnosis not present

## 2020-03-10 DIAGNOSIS — Z20822 Contact with and (suspected) exposure to covid-19: Secondary | ICD-10-CM | POA: Diagnosis not present

## 2020-03-10 DIAGNOSIS — S0501XA Injury of conjunctiva and corneal abrasion without foreign body, right eye, initial encounter: Secondary | ICD-10-CM | POA: Diagnosis not present

## 2020-03-10 DIAGNOSIS — L237 Allergic contact dermatitis due to plants, except food: Secondary | ICD-10-CM | POA: Diagnosis not present

## 2020-03-10 DIAGNOSIS — Z515 Encounter for palliative care: Secondary | ICD-10-CM | POA: Diagnosis not present

## 2020-03-10 DIAGNOSIS — H02834 Dermatochalasis of left upper eyelid: Secondary | ICD-10-CM | POA: Diagnosis not present

## 2020-03-10 DIAGNOSIS — D692 Other nonthrombocytopenic purpura: Secondary | ICD-10-CM | POA: Diagnosis not present

## 2020-03-10 DIAGNOSIS — I208 Other forms of angina pectoris: Secondary | ICD-10-CM | POA: Diagnosis not present

## 2020-03-10 DIAGNOSIS — D481 Neoplasm of uncertain behavior of connective and other soft tissue: Secondary | ICD-10-CM | POA: Diagnosis not present

## 2020-03-10 DIAGNOSIS — Z86007 Personal history of in-situ neoplasm of skin: Secondary | ICD-10-CM | POA: Diagnosis not present

## 2020-03-10 DIAGNOSIS — L300 Nummular dermatitis: Secondary | ICD-10-CM | POA: Diagnosis not present

## 2020-03-10 DIAGNOSIS — H5201 Hypermetropia, right eye: Secondary | ICD-10-CM | POA: Diagnosis not present

## 2020-03-10 DIAGNOSIS — M75111 Incomplete rotator cuff tear or rupture of right shoulder, not specified as traumatic: Secondary | ICD-10-CM | POA: Diagnosis not present

## 2020-03-10 DIAGNOSIS — M79641 Pain in right hand: Secondary | ICD-10-CM | POA: Diagnosis not present

## 2020-03-10 DIAGNOSIS — G40909 Epilepsy, unspecified, not intractable, without status epilepticus: Secondary | ICD-10-CM | POA: Diagnosis not present

## 2020-03-10 DIAGNOSIS — H25012 Cortical age-related cataract, left eye: Secondary | ICD-10-CM | POA: Diagnosis not present

## 2020-03-10 DIAGNOSIS — J3081 Allergic rhinitis due to animal (cat) (dog) hair and dander: Secondary | ICD-10-CM | POA: Diagnosis not present

## 2020-03-10 DIAGNOSIS — N401 Enlarged prostate with lower urinary tract symptoms: Secondary | ICD-10-CM | POA: Diagnosis not present

## 2020-03-10 DIAGNOSIS — N201 Calculus of ureter: Secondary | ICD-10-CM | POA: Diagnosis not present

## 2020-03-10 DIAGNOSIS — X32XXXA Exposure to sunlight, initial encounter: Secondary | ICD-10-CM | POA: Diagnosis not present

## 2020-03-10 DIAGNOSIS — L821 Other seborrheic keratosis: Secondary | ICD-10-CM | POA: Diagnosis not present

## 2020-03-10 DIAGNOSIS — I469 Cardiac arrest, cause unspecified: Secondary | ICD-10-CM | POA: Diagnosis not present

## 2020-03-10 DIAGNOSIS — N1831 Chronic kidney disease, stage 3a: Secondary | ICD-10-CM | POA: Diagnosis not present

## 2020-03-10 DIAGNOSIS — A0471 Enterocolitis due to Clostridium difficile, recurrent: Secondary | ICD-10-CM | POA: Diagnosis not present

## 2020-03-10 DIAGNOSIS — L4059 Other psoriatic arthropathy: Secondary | ICD-10-CM | POA: Diagnosis not present

## 2020-03-10 DIAGNOSIS — N433 Hydrocele, unspecified: Secondary | ICD-10-CM | POA: Diagnosis not present

## 2020-03-10 DIAGNOSIS — Z85828 Personal history of other malignant neoplasm of skin: Secondary | ICD-10-CM | POA: Diagnosis not present

## 2020-03-10 DIAGNOSIS — Z8542 Personal history of malignant neoplasm of other parts of uterus: Secondary | ICD-10-CM | POA: Diagnosis not present

## 2020-03-10 DIAGNOSIS — Z87440 Personal history of urinary (tract) infections: Secondary | ICD-10-CM | POA: Diagnosis not present

## 2020-03-10 DIAGNOSIS — I714 Abdominal aortic aneurysm, without rupture: Secondary | ICD-10-CM | POA: Diagnosis not present

## 2020-03-10 DIAGNOSIS — H353132 Nonexudative age-related macular degeneration, bilateral, intermediate dry stage: Secondary | ICD-10-CM | POA: Diagnosis not present

## 2020-03-10 DIAGNOSIS — N189 Chronic kidney disease, unspecified: Secondary | ICD-10-CM | POA: Diagnosis not present

## 2020-03-10 DIAGNOSIS — J9809 Other diseases of bronchus, not elsewhere classified: Secondary | ICD-10-CM | POA: Diagnosis not present

## 2020-03-10 DIAGNOSIS — C259 Malignant neoplasm of pancreas, unspecified: Secondary | ICD-10-CM | POA: Diagnosis not present

## 2020-03-10 DIAGNOSIS — M5417 Radiculopathy, lumbosacral region: Secondary | ICD-10-CM | POA: Diagnosis not present

## 2020-03-10 DIAGNOSIS — R791 Abnormal coagulation profile: Secondary | ICD-10-CM | POA: Diagnosis not present

## 2020-03-10 DIAGNOSIS — S21109A Unspecified open wound of unspecified front wall of thorax without penetration into thoracic cavity, initial encounter: Secondary | ICD-10-CM | POA: Diagnosis not present

## 2020-03-10 DIAGNOSIS — D1722 Benign lipomatous neoplasm of skin and subcutaneous tissue of left arm: Secondary | ICD-10-CM | POA: Diagnosis not present

## 2020-03-10 DIAGNOSIS — I252 Old myocardial infarction: Secondary | ICD-10-CM | POA: Diagnosis not present

## 2020-03-10 DIAGNOSIS — S0101XA Laceration without foreign body of scalp, initial encounter: Secondary | ICD-10-CM | POA: Diagnosis not present

## 2020-03-10 DIAGNOSIS — H5371 Glare sensitivity: Secondary | ICD-10-CM | POA: Diagnosis not present

## 2020-03-10 DIAGNOSIS — Z87828 Personal history of other (healed) physical injury and trauma: Secondary | ICD-10-CM | POA: Diagnosis not present

## 2020-03-10 DIAGNOSIS — Z1212 Encounter for screening for malignant neoplasm of rectum: Secondary | ICD-10-CM | POA: Diagnosis not present

## 2020-03-10 DIAGNOSIS — K279 Peptic ulcer, site unspecified, unspecified as acute or chronic, without hemorrhage or perforation: Secondary | ICD-10-CM | POA: Diagnosis not present

## 2020-03-10 DIAGNOSIS — I38 Endocarditis, valve unspecified: Secondary | ICD-10-CM | POA: Diagnosis not present

## 2020-03-10 DIAGNOSIS — L659 Nonscarring hair loss, unspecified: Secondary | ICD-10-CM | POA: Diagnosis not present

## 2020-03-10 DIAGNOSIS — E43 Unspecified severe protein-calorie malnutrition: Secondary | ICD-10-CM | POA: Diagnosis not present

## 2020-03-10 DIAGNOSIS — N3001 Acute cystitis with hematuria: Secondary | ICD-10-CM | POA: Diagnosis not present

## 2020-03-10 DIAGNOSIS — E291 Testicular hypofunction: Secondary | ICD-10-CM | POA: Diagnosis not present

## 2020-03-10 DIAGNOSIS — M2569 Stiffness of other specified joint, not elsewhere classified: Secondary | ICD-10-CM | POA: Diagnosis not present

## 2020-03-10 DIAGNOSIS — M818 Other osteoporosis without current pathological fracture: Secondary | ICD-10-CM | POA: Diagnosis not present

## 2020-03-10 DIAGNOSIS — S80829A Blister (nonthermal), unspecified lower leg, initial encounter: Secondary | ICD-10-CM | POA: Diagnosis not present

## 2020-03-10 DIAGNOSIS — L97511 Non-pressure chronic ulcer of other part of right foot limited to breakdown of skin: Secondary | ICD-10-CM | POA: Diagnosis not present

## 2020-03-10 DIAGNOSIS — R200 Anesthesia of skin: Secondary | ICD-10-CM | POA: Diagnosis not present

## 2020-03-10 DIAGNOSIS — N63 Unspecified lump in unspecified breast: Secondary | ICD-10-CM | POA: Diagnosis not present

## 2020-03-10 DIAGNOSIS — F339 Major depressive disorder, recurrent, unspecified: Secondary | ICD-10-CM | POA: Diagnosis not present

## 2020-03-10 DIAGNOSIS — F688 Other specified disorders of adult personality and behavior: Secondary | ICD-10-CM | POA: Diagnosis not present

## 2020-03-10 DIAGNOSIS — N179 Acute kidney failure, unspecified: Secondary | ICD-10-CM | POA: Diagnosis not present

## 2020-03-10 DIAGNOSIS — N889 Noninflammatory disorder of cervix uteri, unspecified: Secondary | ICD-10-CM | POA: Diagnosis not present

## 2020-03-10 DIAGNOSIS — B029 Zoster without complications: Secondary | ICD-10-CM | POA: Diagnosis not present

## 2020-03-10 DIAGNOSIS — I341 Nonrheumatic mitral (valve) prolapse: Secondary | ICD-10-CM | POA: Diagnosis not present

## 2020-03-10 DIAGNOSIS — M5412 Radiculopathy, cervical region: Secondary | ICD-10-CM | POA: Diagnosis not present

## 2020-03-10 DIAGNOSIS — M1710 Unilateral primary osteoarthritis, unspecified knee: Secondary | ICD-10-CM | POA: Diagnosis not present

## 2020-03-10 DIAGNOSIS — L03116 Cellulitis of left lower limb: Secondary | ICD-10-CM | POA: Diagnosis not present

## 2020-03-10 DIAGNOSIS — E872 Acidosis: Secondary | ICD-10-CM | POA: Diagnosis not present

## 2020-03-10 DIAGNOSIS — C538 Malignant neoplasm of overlapping sites of cervix uteri: Secondary | ICD-10-CM | POA: Diagnosis not present

## 2020-03-10 DIAGNOSIS — H52203 Unspecified astigmatism, bilateral: Secondary | ICD-10-CM | POA: Diagnosis not present

## 2020-03-10 DIAGNOSIS — L403 Pustulosis palmaris et plantaris: Secondary | ICD-10-CM | POA: Diagnosis not present

## 2020-03-10 DIAGNOSIS — J15212 Pneumonia due to Methicillin resistant Staphylococcus aureus: Secondary | ICD-10-CM | POA: Diagnosis not present

## 2020-03-10 DIAGNOSIS — K649 Unspecified hemorrhoids: Secondary | ICD-10-CM | POA: Diagnosis not present

## 2020-03-10 DIAGNOSIS — S99921A Unspecified injury of right foot, initial encounter: Secondary | ICD-10-CM | POA: Diagnosis not present

## 2020-03-10 DIAGNOSIS — S0003XA Contusion of scalp, initial encounter: Secondary | ICD-10-CM | POA: Diagnosis not present

## 2020-03-10 DIAGNOSIS — M7061 Trochanteric bursitis, right hip: Secondary | ICD-10-CM | POA: Diagnosis not present

## 2020-03-10 DIAGNOSIS — R630 Anorexia: Secondary | ICD-10-CM | POA: Diagnosis not present

## 2020-03-10 DIAGNOSIS — F411 Generalized anxiety disorder: Secondary | ICD-10-CM | POA: Diagnosis not present

## 2020-03-10 DIAGNOSIS — D509 Iron deficiency anemia, unspecified: Secondary | ICD-10-CM | POA: Diagnosis not present

## 2020-03-10 DIAGNOSIS — M4804 Spinal stenosis, thoracic region: Secondary | ICD-10-CM | POA: Diagnosis not present

## 2020-03-10 DIAGNOSIS — F4320 Adjustment disorder, unspecified: Secondary | ICD-10-CM | POA: Diagnosis not present

## 2020-03-10 DIAGNOSIS — M25662 Stiffness of left knee, not elsewhere classified: Secondary | ICD-10-CM | POA: Diagnosis not present

## 2020-03-10 DIAGNOSIS — R9431 Abnormal electrocardiogram [ECG] [EKG]: Secondary | ICD-10-CM | POA: Diagnosis not present

## 2020-03-10 DIAGNOSIS — S233XXA Sprain of ligaments of thoracic spine, initial encounter: Secondary | ICD-10-CM | POA: Diagnosis not present

## 2020-03-10 DIAGNOSIS — Z808 Family history of malignant neoplasm of other organs or systems: Secondary | ICD-10-CM | POA: Diagnosis not present

## 2020-03-10 DIAGNOSIS — B182 Chronic viral hepatitis C: Secondary | ICD-10-CM | POA: Diagnosis not present

## 2020-03-10 DIAGNOSIS — D7589 Other specified diseases of blood and blood-forming organs: Secondary | ICD-10-CM | POA: Diagnosis not present

## 2020-03-10 DIAGNOSIS — I519 Heart disease, unspecified: Secondary | ICD-10-CM | POA: Diagnosis not present

## 2020-03-10 DIAGNOSIS — T8459XA Infection and inflammatory reaction due to other internal joint prosthesis, initial encounter: Secondary | ICD-10-CM | POA: Diagnosis not present

## 2020-03-10 DIAGNOSIS — R1900 Intra-abdominal and pelvic swelling, mass and lump, unspecified site: Secondary | ICD-10-CM | POA: Diagnosis not present

## 2020-03-10 DIAGNOSIS — R351 Nocturia: Secondary | ICD-10-CM | POA: Diagnosis not present

## 2020-03-10 DIAGNOSIS — R1032 Left lower quadrant pain: Secondary | ICD-10-CM | POA: Diagnosis not present

## 2020-03-10 DIAGNOSIS — R7401 Elevation of levels of liver transaminase levels: Secondary | ICD-10-CM | POA: Diagnosis not present

## 2020-03-10 DIAGNOSIS — H8109 Meniere's disease, unspecified ear: Secondary | ICD-10-CM | POA: Diagnosis not present

## 2020-03-10 DIAGNOSIS — F1010 Alcohol abuse, uncomplicated: Secondary | ICD-10-CM | POA: Diagnosis not present

## 2020-03-10 DIAGNOSIS — M9904 Segmental and somatic dysfunction of sacral region: Secondary | ICD-10-CM | POA: Diagnosis not present

## 2020-03-10 DIAGNOSIS — C189 Malignant neoplasm of colon, unspecified: Secondary | ICD-10-CM | POA: Diagnosis not present

## 2020-03-10 DIAGNOSIS — I890 Lymphedema, not elsewhere classified: Secondary | ICD-10-CM | POA: Diagnosis not present

## 2020-03-10 DIAGNOSIS — H409 Unspecified glaucoma: Secondary | ICD-10-CM | POA: Diagnosis not present

## 2020-03-10 DIAGNOSIS — Z79890 Hormone replacement therapy (postmenopausal): Secondary | ICD-10-CM | POA: Diagnosis not present

## 2020-03-10 DIAGNOSIS — C07 Malignant neoplasm of parotid gland: Secondary | ICD-10-CM | POA: Diagnosis not present

## 2020-03-10 DIAGNOSIS — S060X9A Concussion with loss of consciousness of unspecified duration, initial encounter: Secondary | ICD-10-CM | POA: Diagnosis not present

## 2020-03-10 DIAGNOSIS — Z8679 Personal history of other diseases of the circulatory system: Secondary | ICD-10-CM | POA: Diagnosis not present

## 2020-03-10 DIAGNOSIS — I471 Supraventricular tachycardia: Secondary | ICD-10-CM | POA: Diagnosis not present

## 2020-03-10 DIAGNOSIS — D519 Vitamin B12 deficiency anemia, unspecified: Secondary | ICD-10-CM | POA: Diagnosis not present

## 2020-03-10 DIAGNOSIS — Z79899 Other long term (current) drug therapy: Secondary | ICD-10-CM | POA: Diagnosis not present

## 2020-03-10 DIAGNOSIS — C7800 Secondary malignant neoplasm of unspecified lung: Secondary | ICD-10-CM | POA: Diagnosis not present

## 2020-03-10 DIAGNOSIS — Z0001 Encounter for general adult medical examination with abnormal findings: Secondary | ICD-10-CM | POA: Diagnosis not present

## 2020-03-10 DIAGNOSIS — M19012 Primary osteoarthritis, left shoulder: Secondary | ICD-10-CM | POA: Diagnosis not present

## 2020-03-10 DIAGNOSIS — J432 Centrilobular emphysema: Secondary | ICD-10-CM | POA: Diagnosis not present

## 2020-03-10 DIAGNOSIS — R931 Abnormal findings on diagnostic imaging of heart and coronary circulation: Secondary | ICD-10-CM | POA: Diagnosis not present

## 2020-03-10 DIAGNOSIS — R0600 Dyspnea, unspecified: Secondary | ICD-10-CM | POA: Diagnosis not present

## 2020-03-10 DIAGNOSIS — N3010 Interstitial cystitis (chronic) without hematuria: Secondary | ICD-10-CM | POA: Diagnosis not present

## 2020-03-10 DIAGNOSIS — Z789 Other specified health status: Secondary | ICD-10-CM | POA: Diagnosis not present

## 2020-03-10 DIAGNOSIS — E113591 Type 2 diabetes mellitus with proliferative diabetic retinopathy without macular edema, right eye: Secondary | ICD-10-CM | POA: Diagnosis not present

## 2020-03-10 DIAGNOSIS — Z888 Allergy status to other drugs, medicaments and biological substances status: Secondary | ICD-10-CM | POA: Diagnosis not present

## 2020-03-10 DIAGNOSIS — B001 Herpesviral vesicular dermatitis: Secondary | ICD-10-CM | POA: Diagnosis not present

## 2020-03-10 DIAGNOSIS — R338 Other retention of urine: Secondary | ICD-10-CM | POA: Diagnosis not present

## 2020-03-10 DIAGNOSIS — H02431 Paralytic ptosis of right eyelid: Secondary | ICD-10-CM | POA: Diagnosis not present

## 2020-03-10 DIAGNOSIS — M1611 Unilateral primary osteoarthritis, right hip: Secondary | ICD-10-CM | POA: Diagnosis not present

## 2020-03-10 DIAGNOSIS — R140 Abdominal distension (gaseous): Secondary | ICD-10-CM | POA: Diagnosis not present

## 2020-03-10 DIAGNOSIS — R252 Cramp and spasm: Secondary | ICD-10-CM | POA: Diagnosis not present

## 2020-03-10 DIAGNOSIS — R109 Unspecified abdominal pain: Secondary | ICD-10-CM | POA: Diagnosis not present

## 2020-03-10 DIAGNOSIS — I6522 Occlusion and stenosis of left carotid artery: Secondary | ICD-10-CM | POA: Diagnosis not present

## 2020-03-10 DIAGNOSIS — D122 Benign neoplasm of ascending colon: Secondary | ICD-10-CM | POA: Diagnosis not present

## 2020-03-10 DIAGNOSIS — M1711 Unilateral primary osteoarthritis, right knee: Secondary | ICD-10-CM | POA: Diagnosis not present

## 2020-03-10 DIAGNOSIS — Z1211 Encounter for screening for malignant neoplasm of colon: Secondary | ICD-10-CM | POA: Diagnosis not present

## 2020-03-10 DIAGNOSIS — Z01818 Encounter for other preprocedural examination: Secondary | ICD-10-CM | POA: Diagnosis not present

## 2020-03-10 DIAGNOSIS — N898 Other specified noninflammatory disorders of vagina: Secondary | ICD-10-CM | POA: Diagnosis not present

## 2020-03-10 DIAGNOSIS — H52221 Regular astigmatism, right eye: Secondary | ICD-10-CM | POA: Diagnosis not present

## 2020-03-10 DIAGNOSIS — H40013 Open angle with borderline findings, low risk, bilateral: Secondary | ICD-10-CM | POA: Diagnosis not present

## 2020-03-10 DIAGNOSIS — K769 Liver disease, unspecified: Secondary | ICD-10-CM | POA: Diagnosis not present

## 2020-03-10 DIAGNOSIS — M25652 Stiffness of left hip, not elsewhere classified: Secondary | ICD-10-CM | POA: Diagnosis not present

## 2020-03-10 DIAGNOSIS — Z7289 Other problems related to lifestyle: Secondary | ICD-10-CM | POA: Diagnosis not present

## 2020-03-10 DIAGNOSIS — J4530 Mild persistent asthma, uncomplicated: Secondary | ICD-10-CM | POA: Diagnosis not present

## 2020-03-10 DIAGNOSIS — H25813 Combined forms of age-related cataract, bilateral: Secondary | ICD-10-CM | POA: Diagnosis not present

## 2020-03-10 DIAGNOSIS — N183 Chronic kidney disease, stage 3 unspecified: Secondary | ICD-10-CM | POA: Diagnosis not present

## 2020-03-10 DIAGNOSIS — I50810 Right heart failure, unspecified: Secondary | ICD-10-CM | POA: Diagnosis not present

## 2020-03-10 DIAGNOSIS — I6200 Nontraumatic subdural hemorrhage, unspecified: Secondary | ICD-10-CM | POA: Diagnosis not present

## 2020-03-10 DIAGNOSIS — Z539 Procedure and treatment not carried out, unspecified reason: Secondary | ICD-10-CM | POA: Diagnosis not present

## 2020-03-10 DIAGNOSIS — J069 Acute upper respiratory infection, unspecified: Secondary | ICD-10-CM | POA: Diagnosis not present

## 2020-03-10 DIAGNOSIS — R7989 Other specified abnormal findings of blood chemistry: Secondary | ICD-10-CM | POA: Diagnosis not present

## 2020-03-10 DIAGNOSIS — H3581 Retinal edema: Secondary | ICD-10-CM | POA: Diagnosis not present

## 2020-03-10 DIAGNOSIS — F3181 Bipolar II disorder: Secondary | ICD-10-CM | POA: Diagnosis not present

## 2020-03-10 DIAGNOSIS — R0609 Other forms of dyspnea: Secondary | ICD-10-CM | POA: Diagnosis not present

## 2020-03-10 DIAGNOSIS — T162XXA Foreign body in left ear, initial encounter: Secondary | ICD-10-CM | POA: Diagnosis not present

## 2020-03-10 DIAGNOSIS — H269 Unspecified cataract: Secondary | ICD-10-CM | POA: Diagnosis not present

## 2020-03-10 DIAGNOSIS — L239 Allergic contact dermatitis, unspecified cause: Secondary | ICD-10-CM | POA: Diagnosis not present

## 2020-03-10 DIAGNOSIS — I7101 Dissection of thoracic aorta: Secondary | ICD-10-CM | POA: Diagnosis not present

## 2020-03-10 DIAGNOSIS — M25859 Other specified joint disorders, unspecified hip: Secondary | ICD-10-CM | POA: Diagnosis not present

## 2020-03-10 DIAGNOSIS — R739 Hyperglycemia, unspecified: Secondary | ICD-10-CM | POA: Diagnosis not present

## 2020-03-10 DIAGNOSIS — M25552 Pain in left hip: Secondary | ICD-10-CM | POA: Diagnosis not present

## 2020-03-10 DIAGNOSIS — S82142A Displaced bicondylar fracture of left tibia, initial encounter for closed fracture: Secondary | ICD-10-CM | POA: Diagnosis not present

## 2020-03-10 DIAGNOSIS — E118 Type 2 diabetes mellitus with unspecified complications: Secondary | ICD-10-CM | POA: Diagnosis not present

## 2020-03-10 DIAGNOSIS — C4441 Basal cell carcinoma of skin of scalp and neck: Secondary | ICD-10-CM | POA: Diagnosis not present

## 2020-03-10 DIAGNOSIS — C8241 Follicular lymphoma grade IIIb, lymph nodes of head, face, and neck: Secondary | ICD-10-CM | POA: Diagnosis not present

## 2020-03-10 DIAGNOSIS — I7774 Dissection of vertebral artery: Secondary | ICD-10-CM | POA: Diagnosis not present

## 2020-03-10 DIAGNOSIS — N12 Tubulo-interstitial nephritis, not specified as acute or chronic: Secondary | ICD-10-CM | POA: Diagnosis not present

## 2020-03-10 DIAGNOSIS — S134XXA Sprain of ligaments of cervical spine, initial encounter: Secondary | ICD-10-CM | POA: Diagnosis not present

## 2020-03-10 DIAGNOSIS — I429 Cardiomyopathy, unspecified: Secondary | ICD-10-CM | POA: Diagnosis not present

## 2020-03-10 DIAGNOSIS — M4312 Spondylolisthesis, cervical region: Secondary | ICD-10-CM | POA: Diagnosis not present

## 2020-03-10 DIAGNOSIS — F3342 Major depressive disorder, recurrent, in full remission: Secondary | ICD-10-CM | POA: Diagnosis not present

## 2020-03-10 DIAGNOSIS — I428 Other cardiomyopathies: Secondary | ICD-10-CM | POA: Diagnosis not present

## 2020-03-10 DIAGNOSIS — J45991 Cough variant asthma: Secondary | ICD-10-CM | POA: Diagnosis not present

## 2020-03-10 DIAGNOSIS — Z13220 Encounter for screening for lipoid disorders: Secondary | ICD-10-CM | POA: Diagnosis not present

## 2020-03-10 DIAGNOSIS — C9002 Multiple myeloma in relapse: Secondary | ICD-10-CM | POA: Diagnosis not present

## 2020-03-10 DIAGNOSIS — M069 Rheumatoid arthritis, unspecified: Secondary | ICD-10-CM | POA: Diagnosis not present

## 2020-03-10 DIAGNOSIS — I10 Essential (primary) hypertension: Secondary | ICD-10-CM | POA: Diagnosis not present

## 2020-03-10 DIAGNOSIS — M9903 Segmental and somatic dysfunction of lumbar region: Secondary | ICD-10-CM | POA: Diagnosis not present

## 2020-03-10 DIAGNOSIS — Z13228 Encounter for screening for other metabolic disorders: Secondary | ICD-10-CM | POA: Diagnosis not present

## 2020-03-10 DIAGNOSIS — D1801 Hemangioma of skin and subcutaneous tissue: Secondary | ICD-10-CM | POA: Diagnosis not present

## 2020-03-10 DIAGNOSIS — N1832 Chronic kidney disease, stage 3b: Secondary | ICD-10-CM | POA: Diagnosis not present

## 2020-03-10 DIAGNOSIS — K922 Gastrointestinal hemorrhage, unspecified: Secondary | ICD-10-CM | POA: Diagnosis not present

## 2020-03-10 DIAGNOSIS — K439 Ventral hernia without obstruction or gangrene: Secondary | ICD-10-CM | POA: Diagnosis not present

## 2020-03-10 DIAGNOSIS — M5033 Other cervical disc degeneration, cervicothoracic region: Secondary | ICD-10-CM | POA: Diagnosis not present

## 2020-03-10 DIAGNOSIS — L0291 Cutaneous abscess, unspecified: Secondary | ICD-10-CM | POA: Diagnosis not present

## 2020-03-10 DIAGNOSIS — R110 Nausea: Secondary | ICD-10-CM | POA: Diagnosis not present

## 2020-03-10 DIAGNOSIS — H401130 Primary open-angle glaucoma, bilateral, stage unspecified: Secondary | ICD-10-CM | POA: Diagnosis not present

## 2020-03-10 DIAGNOSIS — M85851 Other specified disorders of bone density and structure, right thigh: Secondary | ICD-10-CM | POA: Diagnosis not present

## 2020-03-10 DIAGNOSIS — I6523 Occlusion and stenosis of bilateral carotid arteries: Secondary | ICD-10-CM | POA: Diagnosis not present

## 2020-03-10 DIAGNOSIS — M461 Sacroiliitis, not elsewhere classified: Secondary | ICD-10-CM | POA: Diagnosis not present

## 2020-03-10 DIAGNOSIS — Z7902 Long term (current) use of antithrombotics/antiplatelets: Secondary | ICD-10-CM | POA: Diagnosis not present

## 2020-03-10 DIAGNOSIS — Z125 Encounter for screening for malignant neoplasm of prostate: Secondary | ICD-10-CM | POA: Diagnosis not present

## 2020-03-10 DIAGNOSIS — L853 Xerosis cutis: Secondary | ICD-10-CM | POA: Diagnosis not present

## 2020-03-10 DIAGNOSIS — K754 Autoimmune hepatitis: Secondary | ICD-10-CM | POA: Diagnosis not present

## 2020-03-10 DIAGNOSIS — M3501 Sicca syndrome with keratoconjunctivitis: Secondary | ICD-10-CM | POA: Diagnosis not present

## 2020-03-10 DIAGNOSIS — Z952 Presence of prosthetic heart valve: Secondary | ICD-10-CM | POA: Diagnosis not present

## 2020-03-10 DIAGNOSIS — J9611 Chronic respiratory failure with hypoxia: Secondary | ICD-10-CM | POA: Diagnosis not present

## 2020-03-10 DIAGNOSIS — M793 Panniculitis, unspecified: Secondary | ICD-10-CM | POA: Diagnosis not present

## 2020-03-10 DIAGNOSIS — Z8781 Personal history of (healed) traumatic fracture: Secondary | ICD-10-CM | POA: Diagnosis not present

## 2020-03-10 DIAGNOSIS — L600 Ingrowing nail: Secondary | ICD-10-CM | POA: Diagnosis not present

## 2020-03-10 DIAGNOSIS — M5137 Other intervertebral disc degeneration, lumbosacral region: Secondary | ICD-10-CM | POA: Diagnosis not present

## 2020-03-10 DIAGNOSIS — Z1389 Encounter for screening for other disorder: Secondary | ICD-10-CM | POA: Diagnosis not present

## 2020-03-10 DIAGNOSIS — Z4802 Encounter for removal of sutures: Secondary | ICD-10-CM | POA: Diagnosis not present

## 2020-03-10 DIAGNOSIS — R413 Other amnesia: Secondary | ICD-10-CM | POA: Diagnosis not present

## 2020-03-10 DIAGNOSIS — J431 Panlobular emphysema: Secondary | ICD-10-CM | POA: Diagnosis not present

## 2020-03-10 DIAGNOSIS — M7581 Other shoulder lesions, right shoulder: Secondary | ICD-10-CM | POA: Diagnosis not present

## 2020-03-10 DIAGNOSIS — H35441 Age-related reticular degeneration of retina, right eye: Secondary | ICD-10-CM | POA: Diagnosis not present

## 2020-03-10 DIAGNOSIS — J300 Vasomotor rhinitis: Secondary | ICD-10-CM | POA: Diagnosis not present

## 2020-03-10 DIAGNOSIS — G4700 Insomnia, unspecified: Secondary | ICD-10-CM | POA: Diagnosis not present

## 2020-03-10 DIAGNOSIS — M5030 Other cervical disc degeneration, unspecified cervical region: Secondary | ICD-10-CM | POA: Diagnosis not present

## 2020-03-10 DIAGNOSIS — M25612 Stiffness of left shoulder, not elsewhere classified: Secondary | ICD-10-CM | POA: Diagnosis not present

## 2020-03-10 DIAGNOSIS — S70362A Insect bite (nonvenomous), left thigh, initial encounter: Secondary | ICD-10-CM | POA: Diagnosis not present

## 2020-03-10 DIAGNOSIS — G4733 Obstructive sleep apnea (adult) (pediatric): Secondary | ICD-10-CM | POA: Diagnosis not present

## 2020-03-10 DIAGNOSIS — T466X5A Adverse effect of antihyperlipidemic and antiarteriosclerotic drugs, initial encounter: Secondary | ICD-10-CM | POA: Diagnosis not present

## 2020-03-10 DIAGNOSIS — B9561 Methicillin susceptible Staphylococcus aureus infection as the cause of diseases classified elsewhere: Secondary | ICD-10-CM | POA: Diagnosis not present

## 2020-03-10 DIAGNOSIS — A0472 Enterocolitis due to Clostridium difficile, not specified as recurrent: Secondary | ICD-10-CM | POA: Diagnosis not present

## 2020-03-10 DIAGNOSIS — L820 Inflamed seborrheic keratosis: Secondary | ICD-10-CM | POA: Diagnosis not present

## 2020-03-10 DIAGNOSIS — Z96619 Presence of unspecified artificial shoulder joint: Secondary | ICD-10-CM | POA: Diagnosis not present

## 2020-03-10 DIAGNOSIS — R683 Clubbing of fingers: Secondary | ICD-10-CM | POA: Diagnosis not present

## 2020-03-10 DIAGNOSIS — M5414 Radiculopathy, thoracic region: Secondary | ICD-10-CM | POA: Diagnosis not present

## 2020-03-10 DIAGNOSIS — Z853 Personal history of malignant neoplasm of breast: Secondary | ICD-10-CM | POA: Diagnosis not present

## 2020-03-10 DIAGNOSIS — R52 Pain, unspecified: Secondary | ICD-10-CM | POA: Diagnosis not present

## 2020-03-10 DIAGNOSIS — M109 Gout, unspecified: Secondary | ICD-10-CM | POA: Diagnosis not present

## 2020-03-10 DIAGNOSIS — I129 Hypertensive chronic kidney disease with stage 1 through stage 4 chronic kidney disease, or unspecified chronic kidney disease: Secondary | ICD-10-CM | POA: Diagnosis not present

## 2020-03-10 DIAGNOSIS — Z8572 Personal history of non-Hodgkin lymphomas: Secondary | ICD-10-CM | POA: Diagnosis not present

## 2020-03-10 DIAGNOSIS — M79605 Pain in left leg: Secondary | ICD-10-CM | POA: Diagnosis not present

## 2020-03-10 DIAGNOSIS — I872 Venous insufficiency (chronic) (peripheral): Secondary | ICD-10-CM | POA: Diagnosis not present

## 2020-03-10 DIAGNOSIS — H35373 Puckering of macula, bilateral: Secondary | ICD-10-CM | POA: Diagnosis not present

## 2020-03-10 DIAGNOSIS — Z801 Family history of malignant neoplasm of trachea, bronchus and lung: Secondary | ICD-10-CM | POA: Diagnosis not present

## 2020-03-10 DIAGNOSIS — E871 Hypo-osmolality and hyponatremia: Secondary | ICD-10-CM | POA: Diagnosis not present

## 2020-03-10 DIAGNOSIS — H10503 Unspecified blepharoconjunctivitis, bilateral: Secondary | ICD-10-CM | POA: Diagnosis not present

## 2020-03-10 DIAGNOSIS — H05222 Edema of left orbit: Secondary | ICD-10-CM | POA: Diagnosis not present

## 2020-03-10 DIAGNOSIS — F310 Bipolar disorder, current episode hypomanic: Secondary | ICD-10-CM | POA: Diagnosis not present

## 2020-03-10 DIAGNOSIS — R55 Syncope and collapse: Secondary | ICD-10-CM | POA: Diagnosis not present

## 2020-03-10 DIAGNOSIS — N959 Unspecified menopausal and perimenopausal disorder: Secondary | ICD-10-CM | POA: Diagnosis not present

## 2020-03-10 DIAGNOSIS — R799 Abnormal finding of blood chemistry, unspecified: Secondary | ICD-10-CM | POA: Diagnosis not present

## 2020-03-10 DIAGNOSIS — S0181XD Laceration without foreign body of other part of head, subsequent encounter: Secondary | ICD-10-CM | POA: Diagnosis not present

## 2020-03-10 DIAGNOSIS — J449 Chronic obstructive pulmonary disease, unspecified: Secondary | ICD-10-CM | POA: Diagnosis not present

## 2020-03-10 DIAGNOSIS — N390 Urinary tract infection, site not specified: Secondary | ICD-10-CM | POA: Diagnosis not present

## 2020-03-10 DIAGNOSIS — R2681 Unsteadiness on feet: Secondary | ICD-10-CM | POA: Diagnosis not present

## 2020-03-10 DIAGNOSIS — M4316 Spondylolisthesis, lumbar region: Secondary | ICD-10-CM | POA: Diagnosis not present

## 2020-03-10 DIAGNOSIS — M129 Arthropathy, unspecified: Secondary | ICD-10-CM | POA: Diagnosis not present

## 2020-03-10 DIAGNOSIS — R079 Chest pain, unspecified: Secondary | ICD-10-CM | POA: Diagnosis not present

## 2020-03-10 DIAGNOSIS — I071 Rheumatic tricuspid insufficiency: Secondary | ICD-10-CM | POA: Diagnosis not present

## 2020-03-10 DIAGNOSIS — E538 Deficiency of other specified B group vitamins: Secondary | ICD-10-CM | POA: Diagnosis not present

## 2020-03-10 DIAGNOSIS — Z6823 Body mass index (BMI) 23.0-23.9, adult: Secondary | ICD-10-CM | POA: Diagnosis not present

## 2020-03-10 DIAGNOSIS — R339 Retention of urine, unspecified: Secondary | ICD-10-CM | POA: Diagnosis not present

## 2020-03-10 DIAGNOSIS — M7501 Adhesive capsulitis of right shoulder: Secondary | ICD-10-CM | POA: Diagnosis not present

## 2020-03-10 DIAGNOSIS — B0052 Herpesviral keratitis: Secondary | ICD-10-CM | POA: Diagnosis not present

## 2020-03-10 DIAGNOSIS — K219 Gastro-esophageal reflux disease without esophagitis: Secondary | ICD-10-CM | POA: Diagnosis not present

## 2020-03-10 DIAGNOSIS — Z8349 Family history of other endocrine, nutritional and metabolic diseases: Secondary | ICD-10-CM | POA: Diagnosis not present

## 2020-03-10 DIAGNOSIS — Z885 Allergy status to narcotic agent status: Secondary | ICD-10-CM | POA: Diagnosis not present

## 2020-03-10 DIAGNOSIS — H35423 Microcystoid degeneration of retina, bilateral: Secondary | ICD-10-CM | POA: Diagnosis not present

## 2020-03-10 DIAGNOSIS — C9000 Multiple myeloma not having achieved remission: Secondary | ICD-10-CM | POA: Diagnosis not present

## 2020-03-10 DIAGNOSIS — J440 Chronic obstructive pulmonary disease with acute lower respiratory infection: Secondary | ICD-10-CM | POA: Diagnosis not present

## 2020-03-10 DIAGNOSIS — E113512 Type 2 diabetes mellitus with proliferative diabetic retinopathy with macular edema, left eye: Secondary | ICD-10-CM | POA: Diagnosis not present

## 2020-03-10 DIAGNOSIS — N952 Postmenopausal atrophic vaginitis: Secondary | ICD-10-CM | POA: Diagnosis not present

## 2020-03-10 DIAGNOSIS — H34811 Central retinal vein occlusion, right eye, with macular edema: Secondary | ICD-10-CM | POA: Diagnosis not present

## 2020-03-10 DIAGNOSIS — I441 Atrioventricular block, second degree: Secondary | ICD-10-CM | POA: Diagnosis not present

## 2020-03-10 DIAGNOSIS — I482 Chronic atrial fibrillation, unspecified: Secondary | ICD-10-CM | POA: Diagnosis not present

## 2020-03-10 DIAGNOSIS — Z8249 Family history of ischemic heart disease and other diseases of the circulatory system: Secondary | ICD-10-CM | POA: Diagnosis not present

## 2020-03-10 DIAGNOSIS — I272 Pulmonary hypertension, unspecified: Secondary | ICD-10-CM | POA: Diagnosis not present

## 2020-03-10 DIAGNOSIS — K5900 Constipation, unspecified: Secondary | ICD-10-CM | POA: Diagnosis not present

## 2020-03-10 DIAGNOSIS — Z83511 Family history of glaucoma: Secondary | ICD-10-CM | POA: Diagnosis not present

## 2020-03-10 DIAGNOSIS — H401312 Pigmentary glaucoma, right eye, moderate stage: Secondary | ICD-10-CM | POA: Diagnosis not present

## 2020-03-10 DIAGNOSIS — R319 Hematuria, unspecified: Secondary | ICD-10-CM | POA: Diagnosis not present

## 2020-03-10 DIAGNOSIS — I8312 Varicose veins of left lower extremity with inflammation: Secondary | ICD-10-CM | POA: Diagnosis not present

## 2020-03-10 DIAGNOSIS — I639 Cerebral infarction, unspecified: Secondary | ICD-10-CM | POA: Diagnosis not present

## 2020-03-10 DIAGNOSIS — Z86010 Personal history of colonic polyps: Secondary | ICD-10-CM | POA: Diagnosis not present

## 2020-03-10 DIAGNOSIS — K222 Esophageal obstruction: Secondary | ICD-10-CM | POA: Diagnosis not present

## 2020-03-10 DIAGNOSIS — L03211 Cellulitis of face: Secondary | ICD-10-CM | POA: Diagnosis not present

## 2020-03-10 DIAGNOSIS — R300 Dysuria: Secondary | ICD-10-CM | POA: Diagnosis not present

## 2020-03-10 DIAGNOSIS — K2100 Gastro-esophageal reflux disease with esophagitis, without bleeding: Secondary | ICD-10-CM | POA: Diagnosis not present

## 2020-03-10 DIAGNOSIS — R293 Abnormal posture: Secondary | ICD-10-CM | POA: Diagnosis not present

## 2020-03-10 DIAGNOSIS — M7582 Other shoulder lesions, left shoulder: Secondary | ICD-10-CM | POA: Diagnosis not present

## 2020-03-10 DIAGNOSIS — M2560 Stiffness of unspecified joint, not elsewhere classified: Secondary | ICD-10-CM | POA: Diagnosis not present

## 2020-03-10 DIAGNOSIS — E1165 Type 2 diabetes mellitus with hyperglycemia: Secondary | ICD-10-CM | POA: Diagnosis not present

## 2020-03-10 DIAGNOSIS — I700 Atherosclerosis of aorta: Secondary | ICD-10-CM | POA: Diagnosis not present

## 2020-03-10 DIAGNOSIS — H4302 Vitreous prolapse, left eye: Secondary | ICD-10-CM | POA: Diagnosis not present

## 2020-03-10 DIAGNOSIS — R269 Unspecified abnormalities of gait and mobility: Secondary | ICD-10-CM | POA: Diagnosis not present

## 2020-03-10 DIAGNOSIS — R296 Repeated falls: Secondary | ICD-10-CM | POA: Diagnosis not present

## 2020-03-10 DIAGNOSIS — Z9889 Other specified postprocedural states: Secondary | ICD-10-CM | POA: Diagnosis not present

## 2020-03-10 DIAGNOSIS — K625 Hemorrhage of anus and rectum: Secondary | ICD-10-CM | POA: Diagnosis not present

## 2020-03-10 DIAGNOSIS — I69351 Hemiplegia and hemiparesis following cerebral infarction affecting right dominant side: Secondary | ICD-10-CM | POA: Diagnosis not present

## 2020-03-10 DIAGNOSIS — F1120 Opioid dependence, uncomplicated: Secondary | ICD-10-CM | POA: Diagnosis not present

## 2020-03-10 DIAGNOSIS — I361 Nonrheumatic tricuspid (valve) insufficiency: Secondary | ICD-10-CM | POA: Diagnosis not present

## 2020-03-10 DIAGNOSIS — Z96652 Presence of left artificial knee joint: Secondary | ICD-10-CM | POA: Diagnosis not present

## 2020-03-10 DIAGNOSIS — S9031XA Contusion of right foot, initial encounter: Secondary | ICD-10-CM | POA: Diagnosis not present

## 2020-03-10 DIAGNOSIS — G546 Phantom limb syndrome with pain: Secondary | ICD-10-CM | POA: Diagnosis not present

## 2020-03-10 DIAGNOSIS — M4712 Other spondylosis with myelopathy, cervical region: Secondary | ICD-10-CM | POA: Diagnosis not present

## 2020-03-10 DIAGNOSIS — L0201 Cutaneous abscess of face: Secondary | ICD-10-CM | POA: Diagnosis not present

## 2020-03-10 DIAGNOSIS — Z23 Encounter for immunization: Secondary | ICD-10-CM | POA: Diagnosis not present

## 2020-03-10 DIAGNOSIS — I5032 Chronic diastolic (congestive) heart failure: Secondary | ICD-10-CM | POA: Diagnosis not present

## 2020-03-10 DIAGNOSIS — M65811 Other synovitis and tenosynovitis, right shoulder: Secondary | ICD-10-CM | POA: Diagnosis not present

## 2020-03-10 DIAGNOSIS — G2581 Restless legs syndrome: Secondary | ICD-10-CM | POA: Diagnosis not present

## 2020-03-10 DIAGNOSIS — R591 Generalized enlarged lymph nodes: Secondary | ICD-10-CM | POA: Diagnosis not present

## 2020-03-10 DIAGNOSIS — H3554 Dystrophies primarily involving the retinal pigment epithelium: Secondary | ICD-10-CM | POA: Diagnosis not present

## 2020-03-10 DIAGNOSIS — M25571 Pain in right ankle and joints of right foot: Secondary | ICD-10-CM | POA: Diagnosis not present

## 2020-03-10 DIAGNOSIS — K621 Rectal polyp: Secondary | ICD-10-CM | POA: Diagnosis not present

## 2020-03-10 DIAGNOSIS — D518 Other vitamin B12 deficiency anemias: Secondary | ICD-10-CM | POA: Diagnosis not present

## 2020-03-10 DIAGNOSIS — M25572 Pain in left ankle and joints of left foot: Secondary | ICD-10-CM | POA: Diagnosis not present

## 2020-03-10 DIAGNOSIS — I2510 Atherosclerotic heart disease of native coronary artery without angina pectoris: Secondary | ICD-10-CM | POA: Diagnosis not present

## 2020-03-10 DIAGNOSIS — K7460 Unspecified cirrhosis of liver: Secondary | ICD-10-CM | POA: Diagnosis not present

## 2020-03-10 DIAGNOSIS — N170 Acute kidney failure with tubular necrosis: Secondary | ICD-10-CM | POA: Diagnosis not present

## 2020-03-10 DIAGNOSIS — S81812A Laceration without foreign body, left lower leg, initial encounter: Secondary | ICD-10-CM | POA: Diagnosis not present

## 2020-03-10 DIAGNOSIS — M9901 Segmental and somatic dysfunction of cervical region: Secondary | ICD-10-CM | POA: Diagnosis not present

## 2020-03-10 DIAGNOSIS — R945 Abnormal results of liver function studies: Secondary | ICD-10-CM | POA: Diagnosis not present

## 2020-03-10 DIAGNOSIS — F4321 Adjustment disorder with depressed mood: Secondary | ICD-10-CM | POA: Diagnosis not present

## 2020-03-10 DIAGNOSIS — D539 Nutritional anemia, unspecified: Secondary | ICD-10-CM | POA: Diagnosis not present

## 2020-03-10 DIAGNOSIS — H903 Sensorineural hearing loss, bilateral: Secondary | ICD-10-CM | POA: Diagnosis not present

## 2020-03-10 DIAGNOSIS — R6889 Other general symptoms and signs: Secondary | ICD-10-CM | POA: Diagnosis not present

## 2020-03-10 DIAGNOSIS — H5203 Hypermetropia, bilateral: Secondary | ICD-10-CM | POA: Diagnosis not present

## 2020-03-10 DIAGNOSIS — N529 Male erectile dysfunction, unspecified: Secondary | ICD-10-CM | POA: Diagnosis not present

## 2020-03-10 DIAGNOSIS — C3490 Malignant neoplasm of unspecified part of unspecified bronchus or lung: Secondary | ICD-10-CM | POA: Diagnosis not present

## 2020-03-10 DIAGNOSIS — I82449 Acute embolism and thrombosis of unspecified tibial vein: Secondary | ICD-10-CM | POA: Diagnosis not present

## 2020-03-10 DIAGNOSIS — N362 Urethral caruncle: Secondary | ICD-10-CM | POA: Diagnosis not present

## 2020-03-10 DIAGNOSIS — A419 Sepsis, unspecified organism: Secondary | ICD-10-CM | POA: Diagnosis not present

## 2020-03-10 DIAGNOSIS — M7712 Lateral epicondylitis, left elbow: Secondary | ICD-10-CM | POA: Diagnosis not present

## 2020-03-10 DIAGNOSIS — R809 Proteinuria, unspecified: Secondary | ICD-10-CM | POA: Diagnosis not present

## 2020-03-10 DIAGNOSIS — C50919 Malignant neoplasm of unspecified site of unspecified female breast: Secondary | ICD-10-CM | POA: Diagnosis not present

## 2020-03-10 DIAGNOSIS — I952 Hypotension due to drugs: Secondary | ICD-10-CM | POA: Diagnosis not present

## 2020-03-10 DIAGNOSIS — D708 Other neutropenia: Secondary | ICD-10-CM | POA: Diagnosis not present

## 2020-03-10 DIAGNOSIS — C641 Malignant neoplasm of right kidney, except renal pelvis: Secondary | ICD-10-CM | POA: Diagnosis not present

## 2020-03-10 DIAGNOSIS — I209 Angina pectoris, unspecified: Secondary | ICD-10-CM | POA: Diagnosis not present

## 2020-03-10 DIAGNOSIS — Z6828 Body mass index (BMI) 28.0-28.9, adult: Secondary | ICD-10-CM | POA: Diagnosis not present

## 2020-03-10 DIAGNOSIS — Z21 Asymptomatic human immunodeficiency virus [HIV] infection status: Secondary | ICD-10-CM | POA: Diagnosis not present

## 2020-03-10 DIAGNOSIS — M7989 Other specified soft tissue disorders: Secondary | ICD-10-CM | POA: Diagnosis not present

## 2020-03-10 DIAGNOSIS — Z7982 Long term (current) use of aspirin: Secondary | ICD-10-CM | POA: Diagnosis not present

## 2020-03-10 DIAGNOSIS — M86171 Other acute osteomyelitis, right ankle and foot: Secondary | ICD-10-CM | POA: Diagnosis not present

## 2020-03-10 DIAGNOSIS — C50812 Malignant neoplasm of overlapping sites of left female breast: Secondary | ICD-10-CM | POA: Diagnosis not present

## 2020-03-10 DIAGNOSIS — N289 Disorder of kidney and ureter, unspecified: Secondary | ICD-10-CM | POA: Diagnosis not present

## 2020-03-10 DIAGNOSIS — J189 Pneumonia, unspecified organism: Secondary | ICD-10-CM | POA: Diagnosis not present

## 2020-03-10 DIAGNOSIS — N951 Menopausal and female climacteric states: Secondary | ICD-10-CM | POA: Diagnosis not present

## 2020-03-10 DIAGNOSIS — E539 Vitamin B deficiency, unspecified: Secondary | ICD-10-CM | POA: Diagnosis not present

## 2020-03-10 DIAGNOSIS — J180 Bronchopneumonia, unspecified organism: Secondary | ICD-10-CM | POA: Diagnosis not present

## 2020-03-10 DIAGNOSIS — F329 Major depressive disorder, single episode, unspecified: Secondary | ICD-10-CM | POA: Diagnosis not present

## 2020-03-10 DIAGNOSIS — M67962 Unspecified disorder of synovium and tendon, left lower leg: Secondary | ICD-10-CM | POA: Diagnosis not present

## 2020-03-10 DIAGNOSIS — S5011XA Contusion of right forearm, initial encounter: Secondary | ICD-10-CM | POA: Diagnosis not present

## 2020-03-10 DIAGNOSIS — Z95811 Presence of heart assist device: Secondary | ICD-10-CM | POA: Diagnosis not present

## 2020-03-10 DIAGNOSIS — F0391 Unspecified dementia with behavioral disturbance: Secondary | ICD-10-CM | POA: Diagnosis not present

## 2020-03-10 DIAGNOSIS — E088 Diabetes mellitus due to underlying condition with unspecified complications: Secondary | ICD-10-CM | POA: Diagnosis not present

## 2020-03-10 DIAGNOSIS — I255 Ischemic cardiomyopathy: Secondary | ICD-10-CM | POA: Diagnosis not present

## 2020-03-10 DIAGNOSIS — M62830 Muscle spasm of back: Secondary | ICD-10-CM | POA: Diagnosis not present

## 2020-03-10 DIAGNOSIS — Z6821 Body mass index (BMI) 21.0-21.9, adult: Secondary | ICD-10-CM | POA: Diagnosis not present

## 2020-03-10 DIAGNOSIS — G44309 Post-traumatic headache, unspecified, not intractable: Secondary | ICD-10-CM | POA: Diagnosis not present

## 2020-03-10 DIAGNOSIS — B351 Tinea unguium: Secondary | ICD-10-CM | POA: Diagnosis not present

## 2020-03-10 DIAGNOSIS — N888 Other specified noninflammatory disorders of cervix uteri: Secondary | ICD-10-CM | POA: Diagnosis not present

## 2020-03-10 DIAGNOSIS — L570 Actinic keratosis: Secondary | ICD-10-CM | POA: Diagnosis not present

## 2020-03-10 DIAGNOSIS — M960 Pseudarthrosis after fusion or arthrodesis: Secondary | ICD-10-CM | POA: Diagnosis not present

## 2020-03-10 DIAGNOSIS — M48062 Spinal stenosis, lumbar region with neurogenic claudication: Secondary | ICD-10-CM | POA: Diagnosis not present

## 2020-03-10 DIAGNOSIS — N3020 Other chronic cystitis without hematuria: Secondary | ICD-10-CM | POA: Diagnosis not present

## 2020-03-10 DIAGNOSIS — E1142 Type 2 diabetes mellitus with diabetic polyneuropathy: Secondary | ICD-10-CM | POA: Diagnosis not present

## 2020-03-10 DIAGNOSIS — J8410 Pulmonary fibrosis, unspecified: Secondary | ICD-10-CM | POA: Diagnosis not present

## 2020-03-10 DIAGNOSIS — J849 Interstitial pulmonary disease, unspecified: Secondary | ICD-10-CM | POA: Diagnosis not present

## 2020-03-10 DIAGNOSIS — M79673 Pain in unspecified foot: Secondary | ICD-10-CM | POA: Diagnosis not present

## 2020-03-10 DIAGNOSIS — M238X1 Other internal derangements of right knee: Secondary | ICD-10-CM | POA: Diagnosis not present

## 2020-03-10 DIAGNOSIS — L578 Other skin changes due to chronic exposure to nonionizing radiation: Secondary | ICD-10-CM | POA: Diagnosis not present

## 2020-03-10 DIAGNOSIS — B078 Other viral warts: Secondary | ICD-10-CM | POA: Diagnosis not present

## 2020-03-10 DIAGNOSIS — D6869 Other thrombophilia: Secondary | ICD-10-CM | POA: Diagnosis not present

## 2020-03-10 DIAGNOSIS — E6609 Other obesity due to excess calories: Secondary | ICD-10-CM | POA: Diagnosis not present

## 2020-03-10 DIAGNOSIS — M25531 Pain in right wrist: Secondary | ICD-10-CM | POA: Diagnosis not present

## 2020-03-10 DIAGNOSIS — F4310 Post-traumatic stress disorder, unspecified: Secondary | ICD-10-CM | POA: Diagnosis not present

## 2020-03-10 DIAGNOSIS — S0001XA Abrasion of scalp, initial encounter: Secondary | ICD-10-CM | POA: Diagnosis not present

## 2020-03-10 DIAGNOSIS — E119 Type 2 diabetes mellitus without complications: Secondary | ICD-10-CM | POA: Diagnosis not present

## 2020-03-10 DIAGNOSIS — M5126 Other intervertebral disc displacement, lumbar region: Secondary | ICD-10-CM | POA: Diagnosis not present

## 2020-03-10 DIAGNOSIS — F0630 Mood disorder due to known physiological condition, unspecified: Secondary | ICD-10-CM | POA: Diagnosis not present

## 2020-03-10 DIAGNOSIS — Z7409 Other reduced mobility: Secondary | ICD-10-CM | POA: Diagnosis not present

## 2020-03-10 DIAGNOSIS — H40053 Ocular hypertension, bilateral: Secondary | ICD-10-CM | POA: Diagnosis not present

## 2020-03-10 DIAGNOSIS — J439 Emphysema, unspecified: Secondary | ICD-10-CM | POA: Diagnosis not present

## 2020-03-10 DIAGNOSIS — K08409 Partial loss of teeth, unspecified cause, unspecified class: Secondary | ICD-10-CM | POA: Diagnosis not present

## 2020-03-10 DIAGNOSIS — Z881 Allergy status to other antibiotic agents status: Secondary | ICD-10-CM | POA: Diagnosis not present

## 2020-03-10 DIAGNOSIS — J90 Pleural effusion, not elsewhere classified: Secondary | ICD-10-CM | POA: Diagnosis not present

## 2020-03-10 DIAGNOSIS — R8290 Unspecified abnormal findings in urine: Secondary | ICD-10-CM | POA: Diagnosis not present

## 2020-03-10 DIAGNOSIS — H401131 Primary open-angle glaucoma, bilateral, mild stage: Secondary | ICD-10-CM | POA: Diagnosis not present

## 2020-03-10 DIAGNOSIS — R7301 Impaired fasting glucose: Secondary | ICD-10-CM | POA: Diagnosis not present

## 2020-03-10 DIAGNOSIS — Z955 Presence of coronary angioplasty implant and graft: Secondary | ICD-10-CM | POA: Diagnosis not present

## 2020-03-10 DIAGNOSIS — H4312 Vitreous hemorrhage, left eye: Secondary | ICD-10-CM | POA: Diagnosis not present

## 2020-03-10 DIAGNOSIS — R972 Elevated prostate specific antigen [PSA]: Secondary | ICD-10-CM | POA: Diagnosis not present

## 2020-03-10 DIAGNOSIS — H34231 Retinal artery branch occlusion, right eye: Secondary | ICD-10-CM | POA: Diagnosis not present

## 2020-03-10 DIAGNOSIS — Z1159 Encounter for screening for other viral diseases: Secondary | ICD-10-CM | POA: Diagnosis not present

## 2020-03-10 DIAGNOSIS — E782 Mixed hyperlipidemia: Secondary | ICD-10-CM | POA: Diagnosis not present

## 2020-03-10 DIAGNOSIS — Z8544 Personal history of malignant neoplasm of other female genital organs: Secondary | ICD-10-CM | POA: Diagnosis not present

## 2020-03-10 DIAGNOSIS — Z0189 Encounter for other specified special examinations: Secondary | ICD-10-CM | POA: Diagnosis not present

## 2020-03-10 DIAGNOSIS — Z452 Encounter for adjustment and management of vascular access device: Secondary | ICD-10-CM | POA: Diagnosis not present

## 2020-03-10 DIAGNOSIS — U071 COVID-19: Secondary | ICD-10-CM | POA: Diagnosis not present

## 2020-03-10 DIAGNOSIS — J029 Acute pharyngitis, unspecified: Secondary | ICD-10-CM | POA: Diagnosis not present

## 2020-03-10 DIAGNOSIS — E1121 Type 2 diabetes mellitus with diabetic nephropathy: Secondary | ICD-10-CM | POA: Diagnosis not present

## 2020-03-10 DIAGNOSIS — R399 Unspecified symptoms and signs involving the genitourinary system: Secondary | ICD-10-CM | POA: Diagnosis not present

## 2020-03-10 DIAGNOSIS — Z7901 Long term (current) use of anticoagulants: Secondary | ICD-10-CM | POA: Diagnosis not present

## 2020-03-10 DIAGNOSIS — W19XXXA Unspecified fall, initial encounter: Secondary | ICD-10-CM | POA: Diagnosis not present

## 2020-03-10 DIAGNOSIS — R509 Fever, unspecified: Secondary | ICD-10-CM | POA: Diagnosis not present

## 2020-03-10 DIAGNOSIS — S6992XD Unspecified injury of left wrist, hand and finger(s), subsequent encounter: Secondary | ICD-10-CM | POA: Diagnosis not present

## 2020-03-10 DIAGNOSIS — M792 Neuralgia and neuritis, unspecified: Secondary | ICD-10-CM | POA: Diagnosis not present

## 2020-03-10 DIAGNOSIS — K146 Glossodynia: Secondary | ICD-10-CM | POA: Diagnosis not present

## 2020-03-10 DIAGNOSIS — Z87442 Personal history of urinary calculi: Secondary | ICD-10-CM | POA: Diagnosis not present

## 2020-03-10 DIAGNOSIS — R011 Cardiac murmur, unspecified: Secondary | ICD-10-CM | POA: Diagnosis not present

## 2020-03-10 DIAGNOSIS — M546 Pain in thoracic spine: Secondary | ICD-10-CM | POA: Diagnosis not present

## 2020-03-10 DIAGNOSIS — N281 Cyst of kidney, acquired: Secondary | ICD-10-CM | POA: Diagnosis not present

## 2020-03-10 DIAGNOSIS — R350 Frequency of micturition: Secondary | ICD-10-CM | POA: Diagnosis not present

## 2020-03-10 DIAGNOSIS — R569 Unspecified convulsions: Secondary | ICD-10-CM | POA: Diagnosis not present

## 2020-03-10 DIAGNOSIS — F1099 Alcohol use, unspecified with unspecified alcohol-induced disorder: Secondary | ICD-10-CM | POA: Diagnosis not present

## 2020-03-10 DIAGNOSIS — K4090 Unilateral inguinal hernia, without obstruction or gangrene, not specified as recurrent: Secondary | ICD-10-CM | POA: Diagnosis not present

## 2020-03-10 DIAGNOSIS — Z7189 Other specified counseling: Secondary | ICD-10-CM | POA: Diagnosis not present

## 2020-03-10 DIAGNOSIS — S3993XA Unspecified injury of pelvis, initial encounter: Secondary | ICD-10-CM | POA: Diagnosis not present

## 2020-03-10 DIAGNOSIS — R1084 Generalized abdominal pain: Secondary | ICD-10-CM | POA: Diagnosis not present

## 2020-03-10 DIAGNOSIS — D649 Anemia, unspecified: Secondary | ICD-10-CM | POA: Diagnosis not present

## 2020-03-10 DIAGNOSIS — G9341 Metabolic encephalopathy: Secondary | ICD-10-CM | POA: Diagnosis not present

## 2020-03-10 DIAGNOSIS — M25521 Pain in right elbow: Secondary | ICD-10-CM | POA: Diagnosis not present

## 2020-03-11 DIAGNOSIS — G9009 Other idiopathic peripheral autonomic neuropathy: Secondary | ICD-10-CM | POA: Diagnosis not present

## 2020-03-11 DIAGNOSIS — J439 Emphysema, unspecified: Secondary | ICD-10-CM | POA: Diagnosis not present

## 2020-03-11 DIAGNOSIS — I4891 Unspecified atrial fibrillation: Secondary | ICD-10-CM | POA: Diagnosis not present

## 2020-03-11 DIAGNOSIS — M545 Low back pain: Secondary | ICD-10-CM | POA: Diagnosis not present

## 2020-03-11 DIAGNOSIS — I6200 Nontraumatic subdural hemorrhage, unspecified: Secondary | ICD-10-CM | POA: Diagnosis not present

## 2020-03-11 DIAGNOSIS — L03119 Cellulitis of unspecified part of limb: Secondary | ICD-10-CM | POA: Diagnosis not present

## 2020-03-11 DIAGNOSIS — Z20822 Contact with and (suspected) exposure to covid-19: Secondary | ICD-10-CM | POA: Diagnosis not present

## 2020-03-11 DIAGNOSIS — F17200 Nicotine dependence, unspecified, uncomplicated: Secondary | ICD-10-CM | POA: Diagnosis not present

## 2020-03-11 DIAGNOSIS — G5603 Carpal tunnel syndrome, bilateral upper limbs: Secondary | ICD-10-CM | POA: Diagnosis not present

## 2020-03-11 DIAGNOSIS — R4182 Altered mental status, unspecified: Secondary | ICD-10-CM | POA: Diagnosis not present

## 2020-03-11 DIAGNOSIS — K4020 Bilateral inguinal hernia, without obstruction or gangrene, not specified as recurrent: Secondary | ICD-10-CM | POA: Diagnosis not present

## 2020-03-11 DIAGNOSIS — M19011 Primary osteoarthritis, right shoulder: Secondary | ICD-10-CM | POA: Diagnosis not present

## 2020-03-11 DIAGNOSIS — E873 Alkalosis: Secondary | ICD-10-CM | POA: Diagnosis not present

## 2020-03-11 DIAGNOSIS — N400 Enlarged prostate without lower urinary tract symptoms: Secondary | ICD-10-CM | POA: Diagnosis not present

## 2020-03-11 DIAGNOSIS — N184 Chronic kidney disease, stage 4 (severe): Secondary | ICD-10-CM | POA: Diagnosis not present

## 2020-03-11 DIAGNOSIS — R296 Repeated falls: Secondary | ICD-10-CM | POA: Diagnosis not present

## 2020-03-11 DIAGNOSIS — G35 Multiple sclerosis: Secondary | ICD-10-CM | POA: Diagnosis not present

## 2020-03-11 DIAGNOSIS — J1282 Pneumonia due to coronavirus disease 2019: Secondary | ICD-10-CM | POA: Diagnosis not present

## 2020-03-11 DIAGNOSIS — R109 Unspecified abdominal pain: Secondary | ICD-10-CM | POA: Diagnosis not present

## 2020-03-11 DIAGNOSIS — I2609 Other pulmonary embolism with acute cor pulmonale: Secondary | ICD-10-CM | POA: Diagnosis not present

## 2020-03-11 DIAGNOSIS — A419 Sepsis, unspecified organism: Secondary | ICD-10-CM | POA: Diagnosis not present

## 2020-03-11 DIAGNOSIS — E860 Dehydration: Secondary | ICD-10-CM | POA: Diagnosis not present

## 2020-03-11 DIAGNOSIS — Z954 Presence of other heart-valve replacement: Secondary | ICD-10-CM | POA: Diagnosis not present

## 2020-03-11 DIAGNOSIS — D631 Anemia in chronic kidney disease: Secondary | ICD-10-CM | POA: Diagnosis not present

## 2020-03-11 DIAGNOSIS — K219 Gastro-esophageal reflux disease without esophagitis: Secondary | ICD-10-CM | POA: Diagnosis not present

## 2020-03-11 DIAGNOSIS — M542 Cervicalgia: Secondary | ICD-10-CM | POA: Diagnosis not present

## 2020-03-11 DIAGNOSIS — B351 Tinea unguium: Secondary | ICD-10-CM | POA: Diagnosis not present

## 2020-03-11 DIAGNOSIS — Z6829 Body mass index (BMI) 29.0-29.9, adult: Secondary | ICD-10-CM | POA: Diagnosis not present

## 2020-03-11 DIAGNOSIS — M5412 Radiculopathy, cervical region: Secondary | ICD-10-CM | POA: Diagnosis not present

## 2020-03-11 DIAGNOSIS — E875 Hyperkalemia: Secondary | ICD-10-CM | POA: Diagnosis not present

## 2020-03-11 DIAGNOSIS — R21 Rash and other nonspecific skin eruption: Secondary | ICD-10-CM | POA: Diagnosis not present

## 2020-03-11 DIAGNOSIS — R52 Pain, unspecified: Secondary | ICD-10-CM | POA: Diagnosis not present

## 2020-03-11 DIAGNOSIS — E876 Hypokalemia: Secondary | ICD-10-CM | POA: Diagnosis not present

## 2020-03-11 DIAGNOSIS — J449 Chronic obstructive pulmonary disease, unspecified: Secondary | ICD-10-CM | POA: Diagnosis not present

## 2020-03-11 DIAGNOSIS — M1712 Unilateral primary osteoarthritis, left knee: Secondary | ICD-10-CM | POA: Diagnosis not present

## 2020-03-11 DIAGNOSIS — D61818 Other pancytopenia: Secondary | ICD-10-CM | POA: Diagnosis not present

## 2020-03-11 DIAGNOSIS — J42 Unspecified chronic bronchitis: Secondary | ICD-10-CM | POA: Diagnosis not present

## 2020-03-11 DIAGNOSIS — Z01812 Encounter for preprocedural laboratory examination: Secondary | ICD-10-CM | POA: Diagnosis not present

## 2020-03-11 DIAGNOSIS — Z951 Presence of aortocoronary bypass graft: Secondary | ICD-10-CM | POA: Diagnosis not present

## 2020-03-11 DIAGNOSIS — G4733 Obstructive sleep apnea (adult) (pediatric): Secondary | ICD-10-CM | POA: Diagnosis not present

## 2020-03-11 DIAGNOSIS — Z79899 Other long term (current) drug therapy: Secondary | ICD-10-CM | POA: Diagnosis not present

## 2020-03-11 DIAGNOSIS — N183 Chronic kidney disease, stage 3 unspecified: Secondary | ICD-10-CM | POA: Diagnosis not present

## 2020-03-11 DIAGNOSIS — S90212A Contusion of left great toe with damage to nail, initial encounter: Secondary | ICD-10-CM | POA: Diagnosis not present

## 2020-03-11 DIAGNOSIS — I872 Venous insufficiency (chronic) (peripheral): Secondary | ICD-10-CM | POA: Diagnosis not present

## 2020-03-11 DIAGNOSIS — I2510 Atherosclerotic heart disease of native coronary artery without angina pectoris: Secondary | ICD-10-CM | POA: Diagnosis not present

## 2020-03-11 DIAGNOSIS — N201 Calculus of ureter: Secondary | ICD-10-CM | POA: Diagnosis not present

## 2020-03-11 DIAGNOSIS — Z4682 Encounter for fitting and adjustment of non-vascular catheter: Secondary | ICD-10-CM | POA: Diagnosis not present

## 2020-03-11 DIAGNOSIS — R079 Chest pain, unspecified: Secondary | ICD-10-CM | POA: Diagnosis not present

## 2020-03-11 DIAGNOSIS — E7800 Pure hypercholesterolemia, unspecified: Secondary | ICD-10-CM | POA: Diagnosis not present

## 2020-03-11 DIAGNOSIS — W19XXXA Unspecified fall, initial encounter: Secondary | ICD-10-CM | POA: Diagnosis not present

## 2020-03-11 DIAGNOSIS — M47812 Spondylosis without myelopathy or radiculopathy, cervical region: Secondary | ICD-10-CM | POA: Diagnosis not present

## 2020-03-11 DIAGNOSIS — E039 Hypothyroidism, unspecified: Secondary | ICD-10-CM | POA: Diagnosis not present

## 2020-03-11 DIAGNOSIS — M19012 Primary osteoarthritis, left shoulder: Secondary | ICD-10-CM | POA: Diagnosis not present

## 2020-03-11 DIAGNOSIS — R519 Headache, unspecified: Secondary | ICD-10-CM | POA: Diagnosis not present

## 2020-03-11 DIAGNOSIS — R29818 Other symptoms and signs involving the nervous system: Secondary | ICD-10-CM | POA: Diagnosis not present

## 2020-03-11 DIAGNOSIS — M4802 Spinal stenosis, cervical region: Secondary | ICD-10-CM | POA: Diagnosis not present

## 2020-03-11 DIAGNOSIS — E119 Type 2 diabetes mellitus without complications: Secondary | ICD-10-CM | POA: Diagnosis not present

## 2020-03-11 DIAGNOSIS — G8929 Other chronic pain: Secondary | ICD-10-CM | POA: Diagnosis not present

## 2020-03-11 DIAGNOSIS — J988 Other specified respiratory disorders: Secondary | ICD-10-CM | POA: Diagnosis not present

## 2020-03-11 DIAGNOSIS — I361 Nonrheumatic tricuspid (valve) insufficiency: Secondary | ICD-10-CM | POA: Diagnosis not present

## 2020-03-11 DIAGNOSIS — I639 Cerebral infarction, unspecified: Secondary | ICD-10-CM | POA: Diagnosis not present

## 2020-03-11 DIAGNOSIS — C44629 Squamous cell carcinoma of skin of left upper limb, including shoulder: Secondary | ICD-10-CM | POA: Diagnosis not present

## 2020-03-11 DIAGNOSIS — R410 Disorientation, unspecified: Secondary | ICD-10-CM | POA: Diagnosis not present

## 2020-03-11 DIAGNOSIS — Z87891 Personal history of nicotine dependence: Secondary | ICD-10-CM | POA: Diagnosis not present

## 2020-03-11 DIAGNOSIS — R42 Dizziness and giddiness: Secondary | ICD-10-CM | POA: Diagnosis not present

## 2020-03-11 DIAGNOSIS — F411 Generalized anxiety disorder: Secondary | ICD-10-CM | POA: Diagnosis not present

## 2020-03-11 DIAGNOSIS — Z96652 Presence of left artificial knee joint: Secondary | ICD-10-CM | POA: Diagnosis not present

## 2020-03-11 DIAGNOSIS — L0201 Cutaneous abscess of face: Secondary | ICD-10-CM | POA: Diagnosis not present

## 2020-03-11 DIAGNOSIS — R6520 Severe sepsis without septic shock: Secondary | ICD-10-CM | POA: Diagnosis not present

## 2020-03-11 DIAGNOSIS — I130 Hypertensive heart and chronic kidney disease with heart failure and stage 1 through stage 4 chronic kidney disease, or unspecified chronic kidney disease: Secondary | ICD-10-CM | POA: Diagnosis not present

## 2020-03-11 DIAGNOSIS — M6281 Muscle weakness (generalized): Secondary | ICD-10-CM | POA: Diagnosis not present

## 2020-03-11 DIAGNOSIS — I5042 Chronic combined systolic (congestive) and diastolic (congestive) heart failure: Secondary | ICD-10-CM | POA: Diagnosis not present

## 2020-03-11 DIAGNOSIS — I69391 Dysphagia following cerebral infarction: Secondary | ICD-10-CM | POA: Diagnosis not present

## 2020-03-11 DIAGNOSIS — D696 Thrombocytopenia, unspecified: Secondary | ICD-10-CM | POA: Diagnosis not present

## 2020-03-11 DIAGNOSIS — R140 Abdominal distension (gaseous): Secondary | ICD-10-CM | POA: Diagnosis not present

## 2020-03-11 DIAGNOSIS — U071 COVID-19: Secondary | ICD-10-CM | POA: Diagnosis not present

## 2020-03-11 DIAGNOSIS — I959 Hypotension, unspecified: Secondary | ICD-10-CM | POA: Diagnosis not present

## 2020-03-11 DIAGNOSIS — R55 Syncope and collapse: Secondary | ICD-10-CM | POA: Diagnosis not present

## 2020-03-11 DIAGNOSIS — J9611 Chronic respiratory failure with hypoxia: Secondary | ICD-10-CM | POA: Diagnosis not present

## 2020-03-11 DIAGNOSIS — R0602 Shortness of breath: Secondary | ICD-10-CM | POA: Diagnosis not present

## 2020-03-11 DIAGNOSIS — Z9189 Other specified personal risk factors, not elsewhere classified: Secondary | ICD-10-CM | POA: Diagnosis not present

## 2020-03-11 DIAGNOSIS — R0989 Other specified symptoms and signs involving the circulatory and respiratory systems: Secondary | ICD-10-CM | POA: Diagnosis not present

## 2020-03-11 DIAGNOSIS — J9601 Acute respiratory failure with hypoxia: Secondary | ICD-10-CM | POA: Diagnosis not present

## 2020-03-11 DIAGNOSIS — R911 Solitary pulmonary nodule: Secondary | ICD-10-CM | POA: Diagnosis not present

## 2020-03-11 DIAGNOSIS — E1122 Type 2 diabetes mellitus with diabetic chronic kidney disease: Secondary | ICD-10-CM | POA: Diagnosis not present

## 2020-03-11 DIAGNOSIS — F419 Anxiety disorder, unspecified: Secondary | ICD-10-CM | POA: Diagnosis not present

## 2020-03-11 DIAGNOSIS — D649 Anemia, unspecified: Secondary | ICD-10-CM | POA: Diagnosis not present

## 2020-03-11 DIAGNOSIS — E8881 Metabolic syndrome: Secondary | ICD-10-CM | POA: Diagnosis not present

## 2020-03-11 DIAGNOSIS — I621 Nontraumatic extradural hemorrhage: Secondary | ICD-10-CM | POA: Diagnosis not present

## 2020-03-11 DIAGNOSIS — N2581 Secondary hyperparathyroidism of renal origin: Secondary | ICD-10-CM | POA: Diagnosis not present

## 2020-03-11 DIAGNOSIS — S3993XA Unspecified injury of pelvis, initial encounter: Secondary | ICD-10-CM | POA: Diagnosis not present

## 2020-03-11 DIAGNOSIS — Z7984 Long term (current) use of oral hypoglycemic drugs: Secondary | ICD-10-CM | POA: Diagnosis not present

## 2020-03-11 DIAGNOSIS — Z818 Family history of other mental and behavioral disorders: Secondary | ICD-10-CM | POA: Diagnosis not present

## 2020-03-11 DIAGNOSIS — E782 Mixed hyperlipidemia: Secondary | ICD-10-CM | POA: Diagnosis not present

## 2020-03-11 DIAGNOSIS — R0981 Nasal congestion: Secondary | ICD-10-CM | POA: Diagnosis not present

## 2020-03-11 DIAGNOSIS — R05 Cough: Secondary | ICD-10-CM | POA: Diagnosis not present

## 2020-03-11 DIAGNOSIS — Z8249 Family history of ischemic heart disease and other diseases of the circulatory system: Secondary | ICD-10-CM | POA: Diagnosis not present

## 2020-03-11 DIAGNOSIS — F4001 Agoraphobia with panic disorder: Secondary | ICD-10-CM | POA: Diagnosis not present

## 2020-03-11 DIAGNOSIS — L03116 Cellulitis of left lower limb: Secondary | ICD-10-CM | POA: Diagnosis not present

## 2020-03-11 DIAGNOSIS — I10 Essential (primary) hypertension: Secondary | ICD-10-CM | POA: Diagnosis not present

## 2020-03-11 DIAGNOSIS — L03211 Cellulitis of face: Secondary | ICD-10-CM | POA: Diagnosis not present

## 2020-03-11 DIAGNOSIS — R220 Localized swelling, mass and lump, head: Secondary | ICD-10-CM | POA: Diagnosis not present

## 2020-03-11 DIAGNOSIS — N529 Male erectile dysfunction, unspecified: Secondary | ICD-10-CM | POA: Diagnosis not present

## 2020-03-11 DIAGNOSIS — Z125 Encounter for screening for malignant neoplasm of prostate: Secondary | ICD-10-CM | POA: Diagnosis not present

## 2020-03-11 DIAGNOSIS — M25561 Pain in right knee: Secondary | ICD-10-CM | POA: Diagnosis not present

## 2020-03-11 DIAGNOSIS — Z6836 Body mass index (BMI) 36.0-36.9, adult: Secondary | ICD-10-CM | POA: Diagnosis not present

## 2020-03-11 DIAGNOSIS — K922 Gastrointestinal hemorrhage, unspecified: Secondary | ICD-10-CM | POA: Diagnosis not present

## 2020-03-11 DIAGNOSIS — S8012XA Contusion of left lower leg, initial encounter: Secondary | ICD-10-CM | POA: Diagnosis not present

## 2020-03-11 DIAGNOSIS — J069 Acute upper respiratory infection, unspecified: Secondary | ICD-10-CM | POA: Diagnosis not present

## 2020-03-11 DIAGNOSIS — B9620 Unspecified Escherichia coli [E. coli] as the cause of diseases classified elsewhere: Secondary | ICD-10-CM | POA: Diagnosis not present

## 2020-03-11 DIAGNOSIS — R739 Hyperglycemia, unspecified: Secondary | ICD-10-CM | POA: Diagnosis not present

## 2020-03-11 DIAGNOSIS — R1031 Right lower quadrant pain: Secondary | ICD-10-CM | POA: Diagnosis not present

## 2020-03-11 DIAGNOSIS — E872 Acidosis: Secondary | ICD-10-CM | POA: Diagnosis not present

## 2020-03-11 DIAGNOSIS — Z8616 Personal history of COVID-19: Secondary | ICD-10-CM | POA: Diagnosis not present

## 2020-03-11 DIAGNOSIS — M25551 Pain in right hip: Secondary | ICD-10-CM | POA: Diagnosis not present

## 2020-03-11 DIAGNOSIS — R7303 Prediabetes: Secondary | ICD-10-CM | POA: Diagnosis not present

## 2020-03-11 DIAGNOSIS — K589 Irritable bowel syndrome without diarrhea: Secondary | ICD-10-CM | POA: Diagnosis not present

## 2020-03-11 DIAGNOSIS — N3000 Acute cystitis without hematuria: Secondary | ICD-10-CM | POA: Diagnosis not present

## 2020-03-11 DIAGNOSIS — M50221 Other cervical disc displacement at C4-C5 level: Secondary | ICD-10-CM | POA: Diagnosis not present

## 2020-03-11 DIAGNOSIS — N390 Urinary tract infection, site not specified: Secondary | ICD-10-CM | POA: Diagnosis not present

## 2020-03-11 DIAGNOSIS — R531 Weakness: Secondary | ICD-10-CM | POA: Diagnosis not present

## 2020-03-11 DIAGNOSIS — S0101XA Laceration without foreign body of scalp, initial encounter: Secondary | ICD-10-CM | POA: Diagnosis not present

## 2020-03-11 DIAGNOSIS — Z7901 Long term (current) use of anticoagulants: Secondary | ICD-10-CM | POA: Diagnosis not present

## 2020-03-11 DIAGNOSIS — R339 Retention of urine, unspecified: Secondary | ICD-10-CM | POA: Diagnosis not present

## 2020-03-11 DIAGNOSIS — S4992XA Unspecified injury of left shoulder and upper arm, initial encounter: Secondary | ICD-10-CM | POA: Diagnosis not present

## 2020-03-11 DIAGNOSIS — J189 Pneumonia, unspecified organism: Secondary | ICD-10-CM | POA: Diagnosis not present

## 2020-03-11 DIAGNOSIS — Z01818 Encounter for other preprocedural examination: Secondary | ICD-10-CM | POA: Diagnosis not present

## 2020-03-11 DIAGNOSIS — R251 Tremor, unspecified: Secondary | ICD-10-CM | POA: Diagnosis not present

## 2020-03-11 DIAGNOSIS — R509 Fever, unspecified: Secondary | ICD-10-CM | POA: Diagnosis not present

## 2020-03-11 DIAGNOSIS — E559 Vitamin D deficiency, unspecified: Secondary | ICD-10-CM | POA: Diagnosis not present

## 2020-03-11 DIAGNOSIS — J029 Acute pharyngitis, unspecified: Secondary | ICD-10-CM | POA: Diagnosis not present

## 2020-03-11 DIAGNOSIS — Z7409 Other reduced mobility: Secondary | ICD-10-CM | POA: Diagnosis not present

## 2020-03-11 DIAGNOSIS — F259 Schizoaffective disorder, unspecified: Secondary | ICD-10-CM | POA: Diagnosis not present

## 2020-03-11 DIAGNOSIS — R413 Other amnesia: Secondary | ICD-10-CM | POA: Diagnosis not present

## 2020-03-11 DIAGNOSIS — R300 Dysuria: Secondary | ICD-10-CM | POA: Diagnosis not present

## 2020-03-11 DIAGNOSIS — R262 Difficulty in walking, not elsewhere classified: Secondary | ICD-10-CM | POA: Diagnosis not present

## 2020-03-11 DIAGNOSIS — J984 Other disorders of lung: Secondary | ICD-10-CM | POA: Diagnosis not present

## 2020-03-11 DIAGNOSIS — E785 Hyperlipidemia, unspecified: Secondary | ICD-10-CM | POA: Diagnosis not present

## 2020-03-11 DIAGNOSIS — K5730 Diverticulosis of large intestine without perforation or abscess without bleeding: Secondary | ICD-10-CM | POA: Diagnosis not present

## 2020-03-11 DIAGNOSIS — R918 Other nonspecific abnormal finding of lung field: Secondary | ICD-10-CM | POA: Diagnosis not present

## 2020-03-11 DIAGNOSIS — N134 Hydroureter: Secondary | ICD-10-CM | POA: Diagnosis not present

## 2020-03-11 DIAGNOSIS — Z5321 Procedure and treatment not carried out due to patient leaving prior to being seen by health care provider: Secondary | ICD-10-CM | POA: Diagnosis not present

## 2020-03-11 DIAGNOSIS — G459 Transient cerebral ischemic attack, unspecified: Secondary | ICD-10-CM | POA: Diagnosis not present

## 2020-03-11 DIAGNOSIS — R319 Hematuria, unspecified: Secondary | ICD-10-CM | POA: Diagnosis not present

## 2020-03-11 DIAGNOSIS — Z1152 Encounter for screening for COVID-19: Secondary | ICD-10-CM | POA: Diagnosis not present

## 2020-03-11 DIAGNOSIS — E349 Endocrine disorder, unspecified: Secondary | ICD-10-CM | POA: Diagnosis not present

## 2020-03-11 DIAGNOSIS — R609 Edema, unspecified: Secondary | ICD-10-CM | POA: Diagnosis not present

## 2020-03-11 DIAGNOSIS — F251 Schizoaffective disorder, depressive type: Secondary | ICD-10-CM | POA: Diagnosis not present

## 2020-03-11 DIAGNOSIS — N179 Acute kidney failure, unspecified: Secondary | ICD-10-CM | POA: Diagnosis not present

## 2020-03-11 DIAGNOSIS — J90 Pleural effusion, not elsewhere classified: Secondary | ICD-10-CM | POA: Diagnosis not present

## 2020-03-11 DIAGNOSIS — E1169 Type 2 diabetes mellitus with other specified complication: Secondary | ICD-10-CM | POA: Diagnosis not present

## 2020-03-11 DIAGNOSIS — R29898 Other symptoms and signs involving the musculoskeletal system: Secondary | ICD-10-CM | POA: Diagnosis not present

## 2020-03-11 DIAGNOSIS — S0993XA Unspecified injury of face, initial encounter: Secondary | ICD-10-CM | POA: Diagnosis not present

## 2020-03-11 DIAGNOSIS — G629 Polyneuropathy, unspecified: Secondary | ICD-10-CM | POA: Diagnosis not present

## 2020-03-11 DIAGNOSIS — E669 Obesity, unspecified: Secondary | ICD-10-CM | POA: Diagnosis not present

## 2020-03-11 DIAGNOSIS — Z86718 Personal history of other venous thrombosis and embolism: Secondary | ICD-10-CM | POA: Diagnosis not present

## 2020-03-11 DIAGNOSIS — J810 Acute pulmonary edema: Secondary | ICD-10-CM | POA: Diagnosis not present

## 2020-03-11 DIAGNOSIS — S0990XA Unspecified injury of head, initial encounter: Secondary | ICD-10-CM | POA: Diagnosis not present

## 2020-03-11 DIAGNOSIS — Z6833 Body mass index (BMI) 33.0-33.9, adult: Secondary | ICD-10-CM | POA: Diagnosis not present

## 2020-03-11 DIAGNOSIS — M79603 Pain in arm, unspecified: Secondary | ICD-10-CM | POA: Diagnosis not present

## 2020-03-11 DIAGNOSIS — R112 Nausea with vomiting, unspecified: Secondary | ICD-10-CM | POA: Diagnosis not present

## 2020-03-11 DIAGNOSIS — M797 Fibromyalgia: Secondary | ICD-10-CM | POA: Diagnosis not present

## 2020-03-11 DIAGNOSIS — Z7689 Persons encountering health services in other specified circumstances: Secondary | ICD-10-CM | POA: Diagnosis not present

## 2020-03-11 DIAGNOSIS — Z6828 Body mass index (BMI) 28.0-28.9, adult: Secondary | ICD-10-CM | POA: Diagnosis not present

## 2020-03-11 DIAGNOSIS — M25512 Pain in left shoulder: Secondary | ICD-10-CM | POA: Diagnosis not present

## 2020-03-11 DIAGNOSIS — F17210 Nicotine dependence, cigarettes, uncomplicated: Secondary | ICD-10-CM | POA: Diagnosis not present

## 2020-03-11 DIAGNOSIS — S299XXA Unspecified injury of thorax, initial encounter: Secondary | ICD-10-CM | POA: Diagnosis not present

## 2020-03-11 DIAGNOSIS — Z7982 Long term (current) use of aspirin: Secondary | ICD-10-CM | POA: Diagnosis not present

## 2020-03-11 DIAGNOSIS — R1011 Right upper quadrant pain: Secondary | ICD-10-CM | POA: Diagnosis not present

## 2020-03-11 DIAGNOSIS — K449 Diaphragmatic hernia without obstruction or gangrene: Secondary | ICD-10-CM | POA: Diagnosis not present

## 2020-03-11 DIAGNOSIS — K5900 Constipation, unspecified: Secondary | ICD-10-CM | POA: Diagnosis not present

## 2020-03-11 DIAGNOSIS — K529 Noninfective gastroenteritis and colitis, unspecified: Secondary | ICD-10-CM | POA: Diagnosis not present

## 2020-03-11 DIAGNOSIS — I700 Atherosclerosis of aorta: Secondary | ICD-10-CM | POA: Diagnosis not present

## 2020-03-11 DIAGNOSIS — I63013 Cerebral infarction due to thrombosis of bilateral vertebral arteries: Secondary | ICD-10-CM | POA: Diagnosis not present

## 2020-03-12 DIAGNOSIS — S80812A Abrasion, left lower leg, initial encounter: Secondary | ICD-10-CM | POA: Diagnosis not present

## 2020-03-12 DIAGNOSIS — I214 Non-ST elevation (NSTEMI) myocardial infarction: Secondary | ICD-10-CM | POA: Diagnosis not present

## 2020-03-12 DIAGNOSIS — I639 Cerebral infarction, unspecified: Secondary | ICD-10-CM | POA: Diagnosis not present

## 2020-03-12 DIAGNOSIS — R140 Abdominal distension (gaseous): Secondary | ICD-10-CM | POA: Diagnosis not present

## 2020-03-12 DIAGNOSIS — E039 Hypothyroidism, unspecified: Secondary | ICD-10-CM | POA: Diagnosis not present

## 2020-03-12 DIAGNOSIS — I130 Hypertensive heart and chronic kidney disease with heart failure and stage 1 through stage 4 chronic kidney disease, or unspecified chronic kidney disease: Secondary | ICD-10-CM | POA: Diagnosis not present

## 2020-03-12 DIAGNOSIS — I959 Hypotension, unspecified: Secondary | ICD-10-CM | POA: Diagnosis not present

## 2020-03-12 DIAGNOSIS — S0990XA Unspecified injury of head, initial encounter: Secondary | ICD-10-CM | POA: Diagnosis not present

## 2020-03-12 DIAGNOSIS — K5909 Other constipation: Secondary | ICD-10-CM | POA: Diagnosis not present

## 2020-03-12 DIAGNOSIS — I119 Hypertensive heart disease without heart failure: Secondary | ICD-10-CM | POA: Diagnosis not present

## 2020-03-12 DIAGNOSIS — F411 Generalized anxiety disorder: Secondary | ICD-10-CM | POA: Diagnosis not present

## 2020-03-12 DIAGNOSIS — G4733 Obstructive sleep apnea (adult) (pediatric): Secondary | ICD-10-CM | POA: Diagnosis not present

## 2020-03-12 DIAGNOSIS — S199XXA Unspecified injury of neck, initial encounter: Secondary | ICD-10-CM | POA: Diagnosis not present

## 2020-03-12 DIAGNOSIS — Z7901 Long term (current) use of anticoagulants: Secondary | ICD-10-CM | POA: Diagnosis not present

## 2020-03-12 DIAGNOSIS — A419 Sepsis, unspecified organism: Secondary | ICD-10-CM | POA: Diagnosis not present

## 2020-03-12 DIAGNOSIS — R197 Diarrhea, unspecified: Secondary | ICD-10-CM | POA: Diagnosis not present

## 2020-03-12 DIAGNOSIS — I10 Essential (primary) hypertension: Secondary | ICD-10-CM | POA: Diagnosis not present

## 2020-03-12 DIAGNOSIS — N200 Calculus of kidney: Secondary | ICD-10-CM | POA: Diagnosis not present

## 2020-03-12 DIAGNOSIS — Z7982 Long term (current) use of aspirin: Secondary | ICD-10-CM | POA: Diagnosis not present

## 2020-03-12 DIAGNOSIS — K068 Other specified disorders of gingiva and edentulous alveolar ridge: Secondary | ICD-10-CM | POA: Diagnosis not present

## 2020-03-12 DIAGNOSIS — S8992XA Unspecified injury of left lower leg, initial encounter: Secondary | ICD-10-CM | POA: Diagnosis not present

## 2020-03-12 DIAGNOSIS — R1084 Generalized abdominal pain: Secondary | ICD-10-CM | POA: Diagnosis not present

## 2020-03-12 DIAGNOSIS — I6782 Cerebral ischemia: Secondary | ICD-10-CM | POA: Diagnosis not present

## 2020-03-12 DIAGNOSIS — K219 Gastro-esophageal reflux disease without esophagitis: Secondary | ICD-10-CM | POA: Diagnosis not present

## 2020-03-12 DIAGNOSIS — U071 COVID-19: Secondary | ICD-10-CM | POA: Diagnosis not present

## 2020-03-12 DIAGNOSIS — J90 Pleural effusion, not elsewhere classified: Secondary | ICD-10-CM | POA: Diagnosis not present

## 2020-03-12 DIAGNOSIS — Z933 Colostomy status: Secondary | ICD-10-CM | POA: Diagnosis not present

## 2020-03-12 DIAGNOSIS — R079 Chest pain, unspecified: Secondary | ICD-10-CM | POA: Diagnosis not present

## 2020-03-12 DIAGNOSIS — R9431 Abnormal electrocardiogram [ECG] [EKG]: Secondary | ICD-10-CM | POA: Diagnosis not present

## 2020-03-12 DIAGNOSIS — F17210 Nicotine dependence, cigarettes, uncomplicated: Secondary | ICD-10-CM | POA: Diagnosis not present

## 2020-03-12 DIAGNOSIS — Z954 Presence of other heart-valve replacement: Secondary | ICD-10-CM | POA: Diagnosis not present

## 2020-03-12 DIAGNOSIS — Z8669 Personal history of other diseases of the nervous system and sense organs: Secondary | ICD-10-CM | POA: Diagnosis not present

## 2020-03-12 DIAGNOSIS — R410 Disorientation, unspecified: Secondary | ICD-10-CM | POA: Diagnosis not present

## 2020-03-12 DIAGNOSIS — T82855A Stenosis of coronary artery stent, initial encounter: Secondary | ICD-10-CM | POA: Diagnosis not present

## 2020-03-12 DIAGNOSIS — R569 Unspecified convulsions: Secondary | ICD-10-CM | POA: Diagnosis not present

## 2020-03-12 DIAGNOSIS — D631 Anemia in chronic kidney disease: Secondary | ICD-10-CM | POA: Diagnosis not present

## 2020-03-12 DIAGNOSIS — R6520 Severe sepsis without septic shock: Secondary | ICD-10-CM | POA: Diagnosis not present

## 2020-03-12 DIAGNOSIS — N179 Acute kidney failure, unspecified: Secondary | ICD-10-CM | POA: Diagnosis not present

## 2020-03-12 DIAGNOSIS — I63532 Cerebral infarction due to unspecified occlusion or stenosis of left posterior cerebral artery: Secondary | ICD-10-CM | POA: Diagnosis not present

## 2020-03-12 DIAGNOSIS — Z955 Presence of coronary angioplasty implant and graft: Secondary | ICD-10-CM | POA: Diagnosis not present

## 2020-03-12 DIAGNOSIS — Z8546 Personal history of malignant neoplasm of prostate: Secondary | ICD-10-CM | POA: Diagnosis not present

## 2020-03-12 DIAGNOSIS — I69354 Hemiplegia and hemiparesis following cerebral infarction affecting left non-dominant side: Secondary | ICD-10-CM | POA: Diagnosis not present

## 2020-03-12 DIAGNOSIS — Z9981 Dependence on supplemental oxygen: Secondary | ICD-10-CM | POA: Diagnosis not present

## 2020-03-12 DIAGNOSIS — I129 Hypertensive chronic kidney disease with stage 1 through stage 4 chronic kidney disease, or unspecified chronic kidney disease: Secondary | ICD-10-CM | POA: Diagnosis not present

## 2020-03-12 DIAGNOSIS — Z951 Presence of aortocoronary bypass graft: Secondary | ICD-10-CM | POA: Diagnosis not present

## 2020-03-12 DIAGNOSIS — C3490 Malignant neoplasm of unspecified part of unspecified bronchus or lung: Secondary | ICD-10-CM | POA: Diagnosis not present

## 2020-03-12 DIAGNOSIS — J1282 Pneumonia due to coronavirus disease 2019: Secondary | ICD-10-CM | POA: Diagnosis not present

## 2020-03-12 DIAGNOSIS — K5900 Constipation, unspecified: Secondary | ICD-10-CM | POA: Diagnosis not present

## 2020-03-12 DIAGNOSIS — F419 Anxiety disorder, unspecified: Secondary | ICD-10-CM | POA: Diagnosis not present

## 2020-03-12 DIAGNOSIS — E782 Mixed hyperlipidemia: Secondary | ICD-10-CM | POA: Diagnosis not present

## 2020-03-12 DIAGNOSIS — R7303 Prediabetes: Secondary | ICD-10-CM | POA: Diagnosis not present

## 2020-03-12 DIAGNOSIS — R112 Nausea with vomiting, unspecified: Secondary | ICD-10-CM | POA: Diagnosis not present

## 2020-03-12 DIAGNOSIS — E441 Mild protein-calorie malnutrition: Secondary | ICD-10-CM | POA: Diagnosis not present

## 2020-03-12 DIAGNOSIS — Z79899 Other long term (current) drug therapy: Secondary | ICD-10-CM | POA: Diagnosis not present

## 2020-03-12 DIAGNOSIS — Z7984 Long term (current) use of oral hypoglycemic drugs: Secondary | ICD-10-CM | POA: Diagnosis not present

## 2020-03-12 DIAGNOSIS — K7469 Other cirrhosis of liver: Secondary | ICD-10-CM | POA: Diagnosis not present

## 2020-03-12 DIAGNOSIS — D62 Acute posthemorrhagic anemia: Secondary | ICD-10-CM | POA: Diagnosis not present

## 2020-03-12 DIAGNOSIS — I447 Left bundle-branch block, unspecified: Secondary | ICD-10-CM | POA: Diagnosis not present

## 2020-03-12 DIAGNOSIS — N2581 Secondary hyperparathyroidism of renal origin: Secondary | ICD-10-CM | POA: Diagnosis not present

## 2020-03-12 DIAGNOSIS — S41112A Laceration without foreign body of left upper arm, initial encounter: Secondary | ICD-10-CM | POA: Diagnosis not present

## 2020-03-12 DIAGNOSIS — J039 Acute tonsillitis, unspecified: Secondary | ICD-10-CM | POA: Diagnosis not present

## 2020-03-12 DIAGNOSIS — Z79891 Long term (current) use of opiate analgesic: Secondary | ICD-10-CM | POA: Diagnosis not present

## 2020-03-12 DIAGNOSIS — Z96649 Presence of unspecified artificial hip joint: Secondary | ICD-10-CM | POA: Diagnosis not present

## 2020-03-12 DIAGNOSIS — M542 Cervicalgia: Secondary | ICD-10-CM | POA: Diagnosis not present

## 2020-03-12 DIAGNOSIS — K922 Gastrointestinal hemorrhage, unspecified: Secondary | ICD-10-CM | POA: Diagnosis not present

## 2020-03-12 DIAGNOSIS — M25572 Pain in left ankle and joints of left foot: Secondary | ICD-10-CM | POA: Diagnosis not present

## 2020-03-12 DIAGNOSIS — K449 Diaphragmatic hernia without obstruction or gangrene: Secondary | ICD-10-CM | POA: Diagnosis not present

## 2020-03-12 DIAGNOSIS — N184 Chronic kidney disease, stage 4 (severe): Secondary | ICD-10-CM | POA: Diagnosis not present

## 2020-03-12 DIAGNOSIS — R05 Cough: Secondary | ICD-10-CM | POA: Diagnosis not present

## 2020-03-12 DIAGNOSIS — Z471 Aftercare following joint replacement surgery: Secondary | ICD-10-CM | POA: Diagnosis not present

## 2020-03-12 DIAGNOSIS — N400 Enlarged prostate without lower urinary tract symptoms: Secondary | ICD-10-CM | POA: Diagnosis not present

## 2020-03-12 DIAGNOSIS — I428 Other cardiomyopathies: Secondary | ICD-10-CM | POA: Diagnosis not present

## 2020-03-12 DIAGNOSIS — E119 Type 2 diabetes mellitus without complications: Secondary | ICD-10-CM | POA: Diagnosis not present

## 2020-03-12 DIAGNOSIS — S99912A Unspecified injury of left ankle, initial encounter: Secondary | ICD-10-CM | POA: Diagnosis not present

## 2020-03-12 DIAGNOSIS — R0902 Hypoxemia: Secondary | ICD-10-CM | POA: Diagnosis not present

## 2020-03-12 DIAGNOSIS — I6389 Other cerebral infarction: Secondary | ICD-10-CM | POA: Diagnosis not present

## 2020-03-12 DIAGNOSIS — D649 Anemia, unspecified: Secondary | ICD-10-CM | POA: Diagnosis not present

## 2020-03-12 DIAGNOSIS — Z23 Encounter for immunization: Secondary | ICD-10-CM | POA: Diagnosis not present

## 2020-03-12 DIAGNOSIS — R29898 Other symptoms and signs involving the musculoskeletal system: Secondary | ICD-10-CM | POA: Diagnosis not present

## 2020-03-12 DIAGNOSIS — S59912A Unspecified injury of left forearm, initial encounter: Secondary | ICD-10-CM | POA: Diagnosis not present

## 2020-03-12 DIAGNOSIS — W57XXXA Bitten or stung by nonvenomous insect and other nonvenomous arthropods, initial encounter: Secondary | ICD-10-CM | POA: Diagnosis not present

## 2020-03-12 DIAGNOSIS — F0391 Unspecified dementia with behavioral disturbance: Secondary | ICD-10-CM | POA: Diagnosis not present

## 2020-03-12 DIAGNOSIS — Z8612 Personal history of poliomyelitis: Secondary | ICD-10-CM | POA: Diagnosis not present

## 2020-03-12 DIAGNOSIS — K625 Hemorrhage of anus and rectum: Secondary | ICD-10-CM | POA: Diagnosis not present

## 2020-03-12 DIAGNOSIS — N183 Chronic kidney disease, stage 3 unspecified: Secondary | ICD-10-CM | POA: Diagnosis not present

## 2020-03-12 DIAGNOSIS — R109 Unspecified abdominal pain: Secondary | ICD-10-CM | POA: Diagnosis not present

## 2020-03-12 DIAGNOSIS — R55 Syncope and collapse: Secondary | ICD-10-CM | POA: Diagnosis not present

## 2020-03-12 DIAGNOSIS — K869 Disease of pancreas, unspecified: Secondary | ICD-10-CM | POA: Diagnosis not present

## 2020-03-12 DIAGNOSIS — R072 Precordial pain: Secondary | ICD-10-CM | POA: Diagnosis not present

## 2020-03-12 DIAGNOSIS — J9811 Atelectasis: Secondary | ICD-10-CM | POA: Diagnosis not present

## 2020-03-12 DIAGNOSIS — M79632 Pain in left forearm: Secondary | ICD-10-CM | POA: Diagnosis not present

## 2020-03-12 DIAGNOSIS — L03116 Cellulitis of left lower limb: Secondary | ICD-10-CM | POA: Diagnosis not present

## 2020-03-12 DIAGNOSIS — M25522 Pain in left elbow: Secondary | ICD-10-CM | POA: Diagnosis not present

## 2020-03-12 DIAGNOSIS — I2510 Atherosclerotic heart disease of native coronary artery without angina pectoris: Secondary | ICD-10-CM | POA: Diagnosis not present

## 2020-03-12 DIAGNOSIS — K7581 Nonalcoholic steatohepatitis (NASH): Secondary | ICD-10-CM | POA: Diagnosis not present

## 2020-03-12 DIAGNOSIS — R531 Weakness: Secondary | ICD-10-CM | POA: Diagnosis not present

## 2020-03-12 DIAGNOSIS — Z9181 History of falling: Secondary | ICD-10-CM | POA: Diagnosis not present

## 2020-03-12 DIAGNOSIS — I350 Nonrheumatic aortic (valve) stenosis: Secondary | ICD-10-CM | POA: Diagnosis not present

## 2020-03-12 DIAGNOSIS — R1013 Epigastric pain: Secondary | ICD-10-CM | POA: Diagnosis not present

## 2020-03-12 DIAGNOSIS — E872 Acidosis: Secondary | ICD-10-CM | POA: Diagnosis not present

## 2020-03-12 DIAGNOSIS — D539 Nutritional anemia, unspecified: Secondary | ICD-10-CM | POA: Diagnosis not present

## 2020-03-12 DIAGNOSIS — R0981 Nasal congestion: Secondary | ICD-10-CM | POA: Diagnosis not present

## 2020-03-12 DIAGNOSIS — Z96651 Presence of right artificial knee joint: Secondary | ICD-10-CM | POA: Diagnosis not present

## 2020-03-12 DIAGNOSIS — S0081XA Abrasion of other part of head, initial encounter: Secondary | ICD-10-CM | POA: Diagnosis not present

## 2020-03-12 DIAGNOSIS — K30 Functional dyspepsia: Secondary | ICD-10-CM | POA: Diagnosis not present

## 2020-03-12 DIAGNOSIS — E875 Hyperkalemia: Secondary | ICD-10-CM | POA: Diagnosis not present

## 2020-03-12 DIAGNOSIS — Z87891 Personal history of nicotine dependence: Secondary | ICD-10-CM | POA: Diagnosis not present

## 2020-03-12 DIAGNOSIS — R141 Gas pain: Secondary | ICD-10-CM | POA: Diagnosis not present

## 2020-03-12 DIAGNOSIS — S51011A Laceration without foreign body of right elbow, initial encounter: Secondary | ICD-10-CM | POA: Diagnosis not present

## 2020-03-12 DIAGNOSIS — E7800 Pure hypercholesterolemia, unspecified: Secondary | ICD-10-CM | POA: Diagnosis not present

## 2020-03-12 DIAGNOSIS — R61 Generalized hyperhidrosis: Secondary | ICD-10-CM | POA: Diagnosis not present

## 2020-03-12 DIAGNOSIS — R42 Dizziness and giddiness: Secondary | ICD-10-CM | POA: Diagnosis not present

## 2020-03-12 DIAGNOSIS — S8012XA Contusion of left lower leg, initial encounter: Secondary | ICD-10-CM | POA: Diagnosis not present

## 2020-03-12 DIAGNOSIS — I4892 Unspecified atrial flutter: Secondary | ICD-10-CM | POA: Diagnosis not present

## 2020-03-12 DIAGNOSIS — S4992XA Unspecified injury of left shoulder and upper arm, initial encounter: Secondary | ICD-10-CM | POA: Diagnosis not present

## 2020-03-12 DIAGNOSIS — Z4682 Encounter for fitting and adjustment of non-vascular catheter: Secondary | ICD-10-CM | POA: Diagnosis not present

## 2020-03-12 DIAGNOSIS — E785 Hyperlipidemia, unspecified: Secondary | ICD-10-CM | POA: Diagnosis not present

## 2020-03-12 DIAGNOSIS — M79605 Pain in left leg: Secondary | ICD-10-CM | POA: Diagnosis not present

## 2020-03-12 DIAGNOSIS — Z4801 Encounter for change or removal of surgical wound dressing: Secondary | ICD-10-CM | POA: Diagnosis not present

## 2020-03-12 DIAGNOSIS — H2512 Age-related nuclear cataract, left eye: Secondary | ICD-10-CM | POA: Diagnosis not present

## 2020-03-12 DIAGNOSIS — D509 Iron deficiency anemia, unspecified: Secondary | ICD-10-CM | POA: Diagnosis not present

## 2020-03-12 DIAGNOSIS — Z20822 Contact with and (suspected) exposure to covid-19: Secondary | ICD-10-CM | POA: Diagnosis not present

## 2020-03-12 DIAGNOSIS — R918 Other nonspecific abnormal finding of lung field: Secondary | ICD-10-CM | POA: Diagnosis not present

## 2020-03-12 DIAGNOSIS — N189 Chronic kidney disease, unspecified: Secondary | ICD-10-CM | POA: Diagnosis not present

## 2020-03-12 DIAGNOSIS — I429 Cardiomyopathy, unspecified: Secondary | ICD-10-CM | POA: Diagnosis not present

## 2020-03-12 DIAGNOSIS — I5022 Chronic systolic (congestive) heart failure: Secondary | ICD-10-CM | POA: Diagnosis not present

## 2020-03-12 DIAGNOSIS — S50861A Insect bite (nonvenomous) of right forearm, initial encounter: Secondary | ICD-10-CM | POA: Diagnosis not present

## 2020-03-12 DIAGNOSIS — I4891 Unspecified atrial fibrillation: Secondary | ICD-10-CM | POA: Diagnosis not present

## 2020-03-12 DIAGNOSIS — R4020 Unspecified coma: Secondary | ICD-10-CM | POA: Diagnosis not present

## 2020-03-12 DIAGNOSIS — I2699 Other pulmonary embolism without acute cor pulmonale: Secondary | ICD-10-CM | POA: Diagnosis not present

## 2020-03-12 DIAGNOSIS — R509 Fever, unspecified: Secondary | ICD-10-CM | POA: Diagnosis not present

## 2020-03-12 DIAGNOSIS — I5032 Chronic diastolic (congestive) heart failure: Secondary | ICD-10-CM | POA: Diagnosis not present

## 2020-03-12 DIAGNOSIS — R0602 Shortness of breath: Secondary | ICD-10-CM | POA: Diagnosis not present

## 2020-03-12 DIAGNOSIS — J9621 Acute and chronic respiratory failure with hypoxia: Secondary | ICD-10-CM | POA: Diagnosis not present

## 2020-03-12 DIAGNOSIS — K5730 Diverticulosis of large intestine without perforation or abscess without bleeding: Secondary | ICD-10-CM | POA: Diagnosis not present

## 2020-03-12 DIAGNOSIS — R9389 Abnormal findings on diagnostic imaging of other specified body structures: Secondary | ICD-10-CM | POA: Diagnosis not present

## 2020-03-12 DIAGNOSIS — S32592A Other specified fracture of left pubis, initial encounter for closed fracture: Secondary | ICD-10-CM | POA: Diagnosis not present

## 2020-03-12 DIAGNOSIS — Z8673 Personal history of transient ischemic attack (TIA), and cerebral infarction without residual deficits: Secondary | ICD-10-CM | POA: Diagnosis not present

## 2020-03-12 DIAGNOSIS — R519 Headache, unspecified: Secondary | ICD-10-CM | POA: Diagnosis not present

## 2020-03-12 DIAGNOSIS — K921 Melena: Secondary | ICD-10-CM | POA: Diagnosis not present

## 2020-03-12 DIAGNOSIS — E1122 Type 2 diabetes mellitus with diabetic chronic kidney disease: Secondary | ICD-10-CM | POA: Diagnosis not present

## 2020-03-12 DIAGNOSIS — R4182 Altered mental status, unspecified: Secondary | ICD-10-CM | POA: Diagnosis not present

## 2020-03-12 DIAGNOSIS — G35 Multiple sclerosis: Secondary | ICD-10-CM | POA: Diagnosis not present

## 2020-03-12 DIAGNOSIS — Z7902 Long term (current) use of antithrombotics/antiplatelets: Secondary | ICD-10-CM | POA: Diagnosis not present

## 2020-03-12 DIAGNOSIS — H353113 Nonexudative age-related macular degeneration, right eye, advanced atrophic without subfoveal involvement: Secondary | ICD-10-CM | POA: Diagnosis not present

## 2020-03-12 DIAGNOSIS — R161 Splenomegaly, not elsewhere classified: Secondary | ICD-10-CM | POA: Diagnosis not present

## 2020-03-12 DIAGNOSIS — R0789 Other chest pain: Secondary | ICD-10-CM | POA: Diagnosis not present

## 2020-03-12 DIAGNOSIS — J9611 Chronic respiratory failure with hypoxia: Secondary | ICD-10-CM | POA: Diagnosis not present

## 2020-03-12 DIAGNOSIS — Z7409 Other reduced mobility: Secondary | ICD-10-CM | POA: Diagnosis not present

## 2020-03-12 DIAGNOSIS — J449 Chronic obstructive pulmonary disease, unspecified: Secondary | ICD-10-CM | POA: Diagnosis not present

## 2020-03-12 DIAGNOSIS — S41111D Laceration without foreign body of right upper arm, subsequent encounter: Secondary | ICD-10-CM | POA: Diagnosis not present

## 2020-03-12 DIAGNOSIS — I63013 Cerebral infarction due to thrombosis of bilateral vertebral arteries: Secondary | ICD-10-CM | POA: Diagnosis not present

## 2020-03-12 DIAGNOSIS — K8020 Calculus of gallbladder without cholecystitis without obstruction: Secondary | ICD-10-CM | POA: Diagnosis not present

## 2020-03-12 DIAGNOSIS — D61818 Other pancytopenia: Secondary | ICD-10-CM | POA: Diagnosis not present

## 2020-03-12 DIAGNOSIS — Z794 Long term (current) use of insulin: Secondary | ICD-10-CM | POA: Diagnosis not present

## 2020-03-12 DIAGNOSIS — I25810 Atherosclerosis of coronary artery bypass graft(s) without angina pectoris: Secondary | ICD-10-CM | POA: Diagnosis not present

## 2020-03-12 DIAGNOSIS — S43102A Unspecified dislocation of left acromioclavicular joint, initial encounter: Secondary | ICD-10-CM | POA: Diagnosis not present

## 2020-03-12 DIAGNOSIS — Z452 Encounter for adjustment and management of vascular access device: Secondary | ICD-10-CM | POA: Diagnosis not present

## 2020-03-12 DIAGNOSIS — J189 Pneumonia, unspecified organism: Secondary | ICD-10-CM | POA: Diagnosis not present

## 2020-03-12 DIAGNOSIS — R110 Nausea: Secondary | ICD-10-CM | POA: Diagnosis not present

## 2020-03-12 DIAGNOSIS — M7989 Other specified soft tissue disorders: Secondary | ICD-10-CM | POA: Diagnosis not present

## 2020-03-12 DIAGNOSIS — G8918 Other acute postprocedural pain: Secondary | ICD-10-CM | POA: Diagnosis not present

## 2020-03-12 DIAGNOSIS — N12 Tubulo-interstitial nephritis, not specified as acute or chronic: Secondary | ICD-10-CM | POA: Diagnosis not present

## 2020-03-12 DIAGNOSIS — C8300 Small cell B-cell lymphoma, unspecified site: Secondary | ICD-10-CM | POA: Diagnosis not present

## 2020-03-12 DIAGNOSIS — I25119 Atherosclerotic heart disease of native coronary artery with unspecified angina pectoris: Secondary | ICD-10-CM | POA: Diagnosis not present

## 2020-03-12 DIAGNOSIS — N1832 Chronic kidney disease, stage 3b: Secondary | ICD-10-CM | POA: Diagnosis not present

## 2020-03-12 DIAGNOSIS — E1169 Type 2 diabetes mellitus with other specified complication: Secondary | ICD-10-CM | POA: Diagnosis not present

## 2020-03-12 DIAGNOSIS — E876 Hypokalemia: Secondary | ICD-10-CM | POA: Diagnosis not present

## 2020-03-12 DIAGNOSIS — I480 Paroxysmal atrial fibrillation: Secondary | ICD-10-CM | POA: Diagnosis not present

## 2020-03-12 DIAGNOSIS — K529 Noninfective gastroenteritis and colitis, unspecified: Secondary | ICD-10-CM | POA: Diagnosis not present

## 2020-03-12 DIAGNOSIS — R778 Other specified abnormalities of plasma proteins: Secondary | ICD-10-CM | POA: Diagnosis not present

## 2020-03-13 DIAGNOSIS — E119 Type 2 diabetes mellitus without complications: Secondary | ICD-10-CM | POA: Diagnosis not present

## 2020-03-13 DIAGNOSIS — Q828 Other specified congenital malformations of skin: Secondary | ICD-10-CM | POA: Diagnosis not present

## 2020-03-13 DIAGNOSIS — E1142 Type 2 diabetes mellitus with diabetic polyneuropathy: Secondary | ICD-10-CM | POA: Diagnosis not present

## 2020-03-13 DIAGNOSIS — H4010X0 Unspecified open-angle glaucoma, stage unspecified: Secondary | ICD-10-CM | POA: Diagnosis not present

## 2020-03-13 DIAGNOSIS — L720 Epidermal cyst: Secondary | ICD-10-CM | POA: Diagnosis not present

## 2020-03-13 DIAGNOSIS — I82462 Acute embolism and thrombosis of left calf muscular vein: Secondary | ICD-10-CM | POA: Diagnosis not present

## 2020-03-13 DIAGNOSIS — I447 Left bundle-branch block, unspecified: Secondary | ICD-10-CM | POA: Diagnosis not present

## 2020-03-13 DIAGNOSIS — M4716 Other spondylosis with myelopathy, lumbar region: Secondary | ICD-10-CM | POA: Diagnosis not present

## 2020-03-13 DIAGNOSIS — M5442 Lumbago with sciatica, left side: Secondary | ICD-10-CM | POA: Diagnosis not present

## 2020-03-13 DIAGNOSIS — M9905 Segmental and somatic dysfunction of pelvic region: Secondary | ICD-10-CM | POA: Diagnosis not present

## 2020-03-13 DIAGNOSIS — K7460 Unspecified cirrhosis of liver: Secondary | ICD-10-CM | POA: Diagnosis not present

## 2020-03-13 DIAGNOSIS — R188 Other ascites: Secondary | ICD-10-CM | POA: Diagnosis not present

## 2020-03-13 DIAGNOSIS — R590 Localized enlarged lymph nodes: Secondary | ICD-10-CM | POA: Diagnosis not present

## 2020-03-13 DIAGNOSIS — J4531 Mild persistent asthma with (acute) exacerbation: Secondary | ICD-10-CM | POA: Diagnosis not present

## 2020-03-13 DIAGNOSIS — Z853 Personal history of malignant neoplasm of breast: Secondary | ICD-10-CM | POA: Diagnosis not present

## 2020-03-13 DIAGNOSIS — J4520 Mild intermittent asthma, uncomplicated: Secondary | ICD-10-CM | POA: Diagnosis not present

## 2020-03-13 DIAGNOSIS — Z7982 Long term (current) use of aspirin: Secondary | ICD-10-CM | POA: Diagnosis not present

## 2020-03-13 DIAGNOSIS — Z86718 Personal history of other venous thrombosis and embolism: Secondary | ICD-10-CM | POA: Diagnosis not present

## 2020-03-13 DIAGNOSIS — H40003 Preglaucoma, unspecified, bilateral: Secondary | ICD-10-CM | POA: Diagnosis not present

## 2020-03-13 DIAGNOSIS — G309 Alzheimer's disease, unspecified: Secondary | ICD-10-CM | POA: Diagnosis not present

## 2020-03-13 DIAGNOSIS — G3184 Mild cognitive impairment, so stated: Secondary | ICD-10-CM | POA: Diagnosis not present

## 2020-03-13 DIAGNOSIS — R195 Other fecal abnormalities: Secondary | ICD-10-CM | POA: Diagnosis not present

## 2020-03-13 DIAGNOSIS — M62512 Muscle wasting and atrophy, not elsewhere classified, left shoulder: Secondary | ICD-10-CM | POA: Diagnosis not present

## 2020-03-13 DIAGNOSIS — L0390 Cellulitis, unspecified: Secondary | ICD-10-CM | POA: Diagnosis not present

## 2020-03-13 DIAGNOSIS — L820 Inflamed seborrheic keratosis: Secondary | ICD-10-CM | POA: Diagnosis not present

## 2020-03-13 DIAGNOSIS — R7989 Other specified abnormal findings of blood chemistry: Secondary | ICD-10-CM | POA: Diagnosis not present

## 2020-03-13 DIAGNOSIS — L03113 Cellulitis of right upper limb: Secondary | ICD-10-CM | POA: Diagnosis not present

## 2020-03-13 DIAGNOSIS — K222 Esophageal obstruction: Secondary | ICD-10-CM | POA: Diagnosis not present

## 2020-03-13 DIAGNOSIS — L244 Irritant contact dermatitis due to drugs in contact with skin: Secondary | ICD-10-CM | POA: Diagnosis not present

## 2020-03-13 DIAGNOSIS — N189 Chronic kidney disease, unspecified: Secondary | ICD-10-CM | POA: Diagnosis not present

## 2020-03-13 DIAGNOSIS — E663 Overweight: Secondary | ICD-10-CM | POA: Diagnosis not present

## 2020-03-13 DIAGNOSIS — M50322 Other cervical disc degeneration at C5-C6 level: Secondary | ICD-10-CM | POA: Diagnosis not present

## 2020-03-13 DIAGNOSIS — I83813 Varicose veins of bilateral lower extremities with pain: Secondary | ICD-10-CM | POA: Diagnosis not present

## 2020-03-13 DIAGNOSIS — B181 Chronic viral hepatitis B without delta-agent: Secondary | ICD-10-CM | POA: Diagnosis not present

## 2020-03-13 DIAGNOSIS — H6983 Other specified disorders of Eustachian tube, bilateral: Secondary | ICD-10-CM | POA: Diagnosis not present

## 2020-03-13 DIAGNOSIS — M79642 Pain in left hand: Secondary | ICD-10-CM | POA: Diagnosis not present

## 2020-03-13 DIAGNOSIS — R1314 Dysphagia, pharyngoesophageal phase: Secondary | ICD-10-CM | POA: Diagnosis not present

## 2020-03-13 DIAGNOSIS — H401132 Primary open-angle glaucoma, bilateral, moderate stage: Secondary | ICD-10-CM | POA: Diagnosis not present

## 2020-03-13 DIAGNOSIS — R6889 Other general symptoms and signs: Secondary | ICD-10-CM | POA: Diagnosis not present

## 2020-03-13 DIAGNOSIS — R109 Unspecified abdominal pain: Secondary | ICD-10-CM | POA: Diagnosis not present

## 2020-03-13 DIAGNOSIS — Z8542 Personal history of malignant neoplasm of other parts of uterus: Secondary | ICD-10-CM | POA: Diagnosis not present

## 2020-03-13 DIAGNOSIS — K5731 Diverticulosis of large intestine without perforation or abscess with bleeding: Secondary | ICD-10-CM | POA: Diagnosis not present

## 2020-03-13 DIAGNOSIS — K754 Autoimmune hepatitis: Secondary | ICD-10-CM | POA: Diagnosis not present

## 2020-03-13 DIAGNOSIS — I899 Noninfective disorder of lymphatic vessels and lymph nodes, unspecified: Secondary | ICD-10-CM | POA: Diagnosis not present

## 2020-03-13 DIAGNOSIS — T63461D Toxic effect of venom of wasps, accidental (unintentional), subsequent encounter: Secondary | ICD-10-CM | POA: Diagnosis not present

## 2020-03-13 DIAGNOSIS — R531 Weakness: Secondary | ICD-10-CM | POA: Diagnosis not present

## 2020-03-13 DIAGNOSIS — H2512 Age-related nuclear cataract, left eye: Secondary | ICD-10-CM | POA: Diagnosis not present

## 2020-03-13 DIAGNOSIS — Z9011 Acquired absence of right breast and nipple: Secondary | ICD-10-CM | POA: Diagnosis not present

## 2020-03-13 DIAGNOSIS — K920 Hematemesis: Secondary | ICD-10-CM | POA: Diagnosis not present

## 2020-03-13 DIAGNOSIS — G44209 Tension-type headache, unspecified, not intractable: Secondary | ICD-10-CM | POA: Diagnosis not present

## 2020-03-13 DIAGNOSIS — F324 Major depressive disorder, single episode, in partial remission: Secondary | ICD-10-CM | POA: Diagnosis not present

## 2020-03-13 DIAGNOSIS — C760 Malignant neoplasm of head, face and neck: Secondary | ICD-10-CM | POA: Diagnosis not present

## 2020-03-13 DIAGNOSIS — D120 Benign neoplasm of cecum: Secondary | ICD-10-CM | POA: Diagnosis not present

## 2020-03-13 DIAGNOSIS — K581 Irritable bowel syndrome with constipation: Secondary | ICD-10-CM | POA: Diagnosis not present

## 2020-03-13 DIAGNOSIS — M79675 Pain in left toe(s): Secondary | ICD-10-CM | POA: Diagnosis not present

## 2020-03-13 DIAGNOSIS — Z452 Encounter for adjustment and management of vascular access device: Secondary | ICD-10-CM | POA: Diagnosis not present

## 2020-03-13 DIAGNOSIS — R2231 Localized swelling, mass and lump, right upper limb: Secondary | ICD-10-CM | POA: Diagnosis not present

## 2020-03-13 DIAGNOSIS — H43813 Vitreous degeneration, bilateral: Secondary | ICD-10-CM | POA: Diagnosis not present

## 2020-03-13 DIAGNOSIS — G4731 Primary central sleep apnea: Secondary | ICD-10-CM | POA: Diagnosis not present

## 2020-03-13 DIAGNOSIS — D500 Iron deficiency anemia secondary to blood loss (chronic): Secondary | ICD-10-CM | POA: Diagnosis not present

## 2020-03-13 DIAGNOSIS — D225 Melanocytic nevi of trunk: Secondary | ICD-10-CM | POA: Diagnosis not present

## 2020-03-13 DIAGNOSIS — L308 Other specified dermatitis: Secondary | ICD-10-CM | POA: Diagnosis not present

## 2020-03-13 DIAGNOSIS — W19XXXA Unspecified fall, initial encounter: Secondary | ICD-10-CM | POA: Diagnosis not present

## 2020-03-13 DIAGNOSIS — E113299 Type 2 diabetes mellitus with mild nonproliferative diabetic retinopathy without macular edema, unspecified eye: Secondary | ICD-10-CM | POA: Diagnosis not present

## 2020-03-13 DIAGNOSIS — J984 Other disorders of lung: Secondary | ICD-10-CM | POA: Diagnosis not present

## 2020-03-13 DIAGNOSIS — Z8719 Personal history of other diseases of the digestive system: Secondary | ICD-10-CM | POA: Diagnosis not present

## 2020-03-13 DIAGNOSIS — M4726 Other spondylosis with radiculopathy, lumbar region: Secondary | ICD-10-CM | POA: Diagnosis not present

## 2020-03-13 DIAGNOSIS — I2721 Secondary pulmonary arterial hypertension: Secondary | ICD-10-CM | POA: Diagnosis not present

## 2020-03-13 DIAGNOSIS — G4700 Insomnia, unspecified: Secondary | ICD-10-CM | POA: Diagnosis not present

## 2020-03-13 DIAGNOSIS — Z85118 Personal history of other malignant neoplasm of bronchus and lung: Secondary | ICD-10-CM | POA: Diagnosis not present

## 2020-03-13 DIAGNOSIS — M4710 Other spondylosis with myelopathy, site unspecified: Secondary | ICD-10-CM | POA: Diagnosis not present

## 2020-03-13 DIAGNOSIS — M48061 Spinal stenosis, lumbar region without neurogenic claudication: Secondary | ICD-10-CM | POA: Diagnosis not present

## 2020-03-13 DIAGNOSIS — F09 Unspecified mental disorder due to known physiological condition: Secondary | ICD-10-CM | POA: Diagnosis not present

## 2020-03-13 DIAGNOSIS — H40013 Open angle with borderline findings, low risk, bilateral: Secondary | ICD-10-CM | POA: Diagnosis not present

## 2020-03-13 DIAGNOSIS — L309 Dermatitis, unspecified: Secondary | ICD-10-CM | POA: Diagnosis not present

## 2020-03-13 DIAGNOSIS — H18513 Endothelial corneal dystrophy, bilateral: Secondary | ICD-10-CM | POA: Diagnosis not present

## 2020-03-13 DIAGNOSIS — L851 Acquired keratosis [keratoderma] palmaris et plantaris: Secondary | ICD-10-CM | POA: Diagnosis not present

## 2020-03-13 DIAGNOSIS — M2012 Hallux valgus (acquired), left foot: Secondary | ICD-10-CM | POA: Diagnosis not present

## 2020-03-13 DIAGNOSIS — R42 Dizziness and giddiness: Secondary | ICD-10-CM | POA: Diagnosis not present

## 2020-03-13 DIAGNOSIS — F5232 Male orgasmic disorder: Secondary | ICD-10-CM | POA: Diagnosis not present

## 2020-03-13 DIAGNOSIS — H35363 Drusen (degenerative) of macula, bilateral: Secondary | ICD-10-CM | POA: Diagnosis not present

## 2020-03-13 DIAGNOSIS — Z992 Dependence on renal dialysis: Secondary | ICD-10-CM | POA: Diagnosis not present

## 2020-03-13 DIAGNOSIS — H40023 Open angle with borderline findings, high risk, bilateral: Secondary | ICD-10-CM | POA: Diagnosis not present

## 2020-03-13 DIAGNOSIS — H353231 Exudative age-related macular degeneration, bilateral, with active choroidal neovascularization: Secondary | ICD-10-CM | POA: Diagnosis not present

## 2020-03-13 DIAGNOSIS — K228 Other specified diseases of esophagus: Secondary | ICD-10-CM | POA: Diagnosis not present

## 2020-03-13 DIAGNOSIS — T387X5D Adverse effect of androgens and anabolic congeners, subsequent encounter: Secondary | ICD-10-CM | POA: Diagnosis not present

## 2020-03-13 DIAGNOSIS — H2703 Aphakia, bilateral: Secondary | ICD-10-CM | POA: Diagnosis not present

## 2020-03-13 DIAGNOSIS — H0102A Squamous blepharitis right eye, upper and lower eyelids: Secondary | ICD-10-CM | POA: Diagnosis not present

## 2020-03-13 DIAGNOSIS — D510 Vitamin B12 deficiency anemia due to intrinsic factor deficiency: Secondary | ICD-10-CM | POA: Diagnosis not present

## 2020-03-13 DIAGNOSIS — M0500 Felty's syndrome, unspecified site: Secondary | ICD-10-CM | POA: Diagnosis not present

## 2020-03-13 DIAGNOSIS — M7061 Trochanteric bursitis, right hip: Secondary | ICD-10-CM | POA: Diagnosis not present

## 2020-03-13 DIAGNOSIS — Z961 Presence of intraocular lens: Secondary | ICD-10-CM | POA: Diagnosis not present

## 2020-03-13 DIAGNOSIS — R82998 Other abnormal findings in urine: Secondary | ICD-10-CM | POA: Diagnosis not present

## 2020-03-13 DIAGNOSIS — B028 Zoster with other complications: Secondary | ICD-10-CM | POA: Diagnosis not present

## 2020-03-13 DIAGNOSIS — M1990 Unspecified osteoarthritis, unspecified site: Secondary | ICD-10-CM | POA: Diagnosis not present

## 2020-03-13 DIAGNOSIS — Z7984 Long term (current) use of oral hypoglycemic drugs: Secondary | ICD-10-CM | POA: Diagnosis not present

## 2020-03-13 DIAGNOSIS — M1611 Unilateral primary osteoarthritis, right hip: Secondary | ICD-10-CM | POA: Diagnosis not present

## 2020-03-13 DIAGNOSIS — Z7951 Long term (current) use of inhaled steroids: Secondary | ICD-10-CM | POA: Diagnosis not present

## 2020-03-13 DIAGNOSIS — E041 Nontoxic single thyroid nodule: Secondary | ICD-10-CM | POA: Diagnosis not present

## 2020-03-13 DIAGNOSIS — M4316 Spondylolisthesis, lumbar region: Secondary | ICD-10-CM | POA: Diagnosis not present

## 2020-03-13 DIAGNOSIS — H0288A Meibomian gland dysfunction right eye, upper and lower eyelids: Secondary | ICD-10-CM | POA: Diagnosis not present

## 2020-03-13 DIAGNOSIS — E7849 Other hyperlipidemia: Secondary | ICD-10-CM | POA: Diagnosis not present

## 2020-03-13 DIAGNOSIS — E1169 Type 2 diabetes mellitus with other specified complication: Secondary | ICD-10-CM | POA: Diagnosis not present

## 2020-03-13 DIAGNOSIS — E871 Hypo-osmolality and hyponatremia: Secondary | ICD-10-CM | POA: Diagnosis not present

## 2020-03-13 DIAGNOSIS — Z993 Dependence on wheelchair: Secondary | ICD-10-CM | POA: Diagnosis not present

## 2020-03-13 DIAGNOSIS — K429 Umbilical hernia without obstruction or gangrene: Secondary | ICD-10-CM | POA: Diagnosis not present

## 2020-03-13 DIAGNOSIS — T63441D Toxic effect of venom of bees, accidental (unintentional), subsequent encounter: Secondary | ICD-10-CM | POA: Diagnosis not present

## 2020-03-13 DIAGNOSIS — G9341 Metabolic encephalopathy: Secondary | ICD-10-CM | POA: Diagnosis not present

## 2020-03-13 DIAGNOSIS — R1011 Right upper quadrant pain: Secondary | ICD-10-CM | POA: Diagnosis not present

## 2020-03-13 DIAGNOSIS — N529 Male erectile dysfunction, unspecified: Secondary | ICD-10-CM | POA: Diagnosis not present

## 2020-03-13 DIAGNOSIS — D2362 Other benign neoplasm of skin of left upper limb, including shoulder: Secondary | ICD-10-CM | POA: Diagnosis not present

## 2020-03-13 DIAGNOSIS — R279 Unspecified lack of coordination: Secondary | ICD-10-CM | POA: Diagnosis not present

## 2020-03-13 DIAGNOSIS — J069 Acute upper respiratory infection, unspecified: Secondary | ICD-10-CM | POA: Diagnosis not present

## 2020-03-13 DIAGNOSIS — M3501 Sicca syndrome with keratoconjunctivitis: Secondary | ICD-10-CM | POA: Diagnosis not present

## 2020-03-13 DIAGNOSIS — M4722 Other spondylosis with radiculopathy, cervical region: Secondary | ICD-10-CM | POA: Diagnosis not present

## 2020-03-13 DIAGNOSIS — R791 Abnormal coagulation profile: Secondary | ICD-10-CM | POA: Diagnosis not present

## 2020-03-13 DIAGNOSIS — G5700 Lesion of sciatic nerve, unspecified lower limb: Secondary | ICD-10-CM | POA: Diagnosis not present

## 2020-03-13 DIAGNOSIS — R112 Nausea with vomiting, unspecified: Secondary | ICD-10-CM | POA: Diagnosis not present

## 2020-03-13 DIAGNOSIS — Z01812 Encounter for preprocedural laboratory examination: Secondary | ICD-10-CM | POA: Diagnosis not present

## 2020-03-13 DIAGNOSIS — M0589 Other rheumatoid arthritis with rheumatoid factor of multiple sites: Secondary | ICD-10-CM | POA: Diagnosis not present

## 2020-03-13 DIAGNOSIS — C7951 Secondary malignant neoplasm of bone: Secondary | ICD-10-CM | POA: Diagnosis not present

## 2020-03-13 DIAGNOSIS — M7542 Impingement syndrome of left shoulder: Secondary | ICD-10-CM | POA: Diagnosis not present

## 2020-03-13 DIAGNOSIS — G904 Autonomic dysreflexia: Secondary | ICD-10-CM | POA: Diagnosis not present

## 2020-03-13 DIAGNOSIS — J3081 Allergic rhinitis due to animal (cat) (dog) hair and dander: Secondary | ICD-10-CM | POA: Diagnosis not present

## 2020-03-13 DIAGNOSIS — H2511 Age-related nuclear cataract, right eye: Secondary | ICD-10-CM | POA: Diagnosis not present

## 2020-03-13 DIAGNOSIS — J9601 Acute respiratory failure with hypoxia: Secondary | ICD-10-CM | POA: Diagnosis not present

## 2020-03-13 DIAGNOSIS — N138 Other obstructive and reflux uropathy: Secondary | ICD-10-CM | POA: Diagnosis not present

## 2020-03-13 DIAGNOSIS — F209 Schizophrenia, unspecified: Secondary | ICD-10-CM | POA: Diagnosis not present

## 2020-03-13 DIAGNOSIS — C3490 Malignant neoplasm of unspecified part of unspecified bronchus or lung: Secondary | ICD-10-CM | POA: Diagnosis not present

## 2020-03-13 DIAGNOSIS — G950 Syringomyelia and syringobulbia: Secondary | ICD-10-CM | POA: Diagnosis not present

## 2020-03-13 DIAGNOSIS — N393 Stress incontinence (female) (male): Secondary | ICD-10-CM | POA: Diagnosis not present

## 2020-03-13 DIAGNOSIS — M5134 Other intervertebral disc degeneration, thoracic region: Secondary | ICD-10-CM | POA: Diagnosis not present

## 2020-03-13 DIAGNOSIS — M25561 Pain in right knee: Secondary | ICD-10-CM | POA: Diagnosis not present

## 2020-03-13 DIAGNOSIS — F5101 Primary insomnia: Secondary | ICD-10-CM | POA: Diagnosis not present

## 2020-03-13 DIAGNOSIS — E890 Postprocedural hypothyroidism: Secondary | ICD-10-CM | POA: Diagnosis not present

## 2020-03-13 DIAGNOSIS — M5440 Lumbago with sciatica, unspecified side: Secondary | ICD-10-CM | POA: Diagnosis not present

## 2020-03-13 DIAGNOSIS — D631 Anemia in chronic kidney disease: Secondary | ICD-10-CM | POA: Diagnosis not present

## 2020-03-13 DIAGNOSIS — R29898 Other symptoms and signs involving the musculoskeletal system: Secondary | ICD-10-CM | POA: Diagnosis not present

## 2020-03-13 DIAGNOSIS — G608 Other hereditary and idiopathic neuropathies: Secondary | ICD-10-CM | POA: Diagnosis not present

## 2020-03-13 DIAGNOSIS — I359 Nonrheumatic aortic valve disorder, unspecified: Secondary | ICD-10-CM | POA: Diagnosis not present

## 2020-03-13 DIAGNOSIS — D539 Nutritional anemia, unspecified: Secondary | ICD-10-CM | POA: Diagnosis not present

## 2020-03-13 DIAGNOSIS — L97812 Non-pressure chronic ulcer of other part of right lower leg with fat layer exposed: Secondary | ICD-10-CM | POA: Diagnosis not present

## 2020-03-13 DIAGNOSIS — H4311 Vitreous hemorrhage, right eye: Secondary | ICD-10-CM | POA: Diagnosis not present

## 2020-03-13 DIAGNOSIS — S46011D Strain of muscle(s) and tendon(s) of the rotator cuff of right shoulder, subsequent encounter: Secondary | ICD-10-CM | POA: Diagnosis not present

## 2020-03-13 DIAGNOSIS — G619 Inflammatory polyneuropathy, unspecified: Secondary | ICD-10-CM | POA: Diagnosis not present

## 2020-03-13 DIAGNOSIS — M19041 Primary osteoarthritis, right hand: Secondary | ICD-10-CM | POA: Diagnosis not present

## 2020-03-13 DIAGNOSIS — M65332 Trigger finger, left middle finger: Secondary | ICD-10-CM | POA: Diagnosis not present

## 2020-03-13 DIAGNOSIS — Z131 Encounter for screening for diabetes mellitus: Secondary | ICD-10-CM | POA: Diagnosis not present

## 2020-03-13 DIAGNOSIS — D2239 Melanocytic nevi of other parts of face: Secondary | ICD-10-CM | POA: Diagnosis not present

## 2020-03-13 DIAGNOSIS — R030 Elevated blood-pressure reading, without diagnosis of hypertension: Secondary | ICD-10-CM | POA: Diagnosis not present

## 2020-03-13 DIAGNOSIS — E860 Dehydration: Secondary | ICD-10-CM | POA: Diagnosis not present

## 2020-03-13 DIAGNOSIS — E038 Other specified hypothyroidism: Secondary | ICD-10-CM | POA: Diagnosis not present

## 2020-03-13 DIAGNOSIS — M1711 Unilateral primary osteoarthritis, right knee: Secondary | ICD-10-CM | POA: Diagnosis not present

## 2020-03-13 DIAGNOSIS — Z981 Arthrodesis status: Secondary | ICD-10-CM | POA: Diagnosis not present

## 2020-03-13 DIAGNOSIS — D3131 Benign neoplasm of right choroid: Secondary | ICD-10-CM | POA: Diagnosis not present

## 2020-03-13 DIAGNOSIS — M159 Polyosteoarthritis, unspecified: Secondary | ICD-10-CM | POA: Diagnosis not present

## 2020-03-13 DIAGNOSIS — H25011 Cortical age-related cataract, right eye: Secondary | ICD-10-CM | POA: Diagnosis not present

## 2020-03-13 DIAGNOSIS — H25013 Cortical age-related cataract, bilateral: Secondary | ICD-10-CM | POA: Diagnosis not present

## 2020-03-13 DIAGNOSIS — S81812A Laceration without foreign body, left lower leg, initial encounter: Secondary | ICD-10-CM | POA: Diagnosis not present

## 2020-03-13 DIAGNOSIS — E0821 Diabetes mellitus due to underlying condition with diabetic nephropathy: Secondary | ICD-10-CM | POA: Diagnosis not present

## 2020-03-13 DIAGNOSIS — Z136 Encounter for screening for cardiovascular disorders: Secondary | ICD-10-CM | POA: Diagnosis not present

## 2020-03-13 DIAGNOSIS — G629 Polyneuropathy, unspecified: Secondary | ICD-10-CM | POA: Diagnosis not present

## 2020-03-13 DIAGNOSIS — H6123 Impacted cerumen, bilateral: Secondary | ICD-10-CM | POA: Diagnosis not present

## 2020-03-13 DIAGNOSIS — E538 Deficiency of other specified B group vitamins: Secondary | ICD-10-CM | POA: Diagnosis not present

## 2020-03-13 DIAGNOSIS — M129 Arthropathy, unspecified: Secondary | ICD-10-CM | POA: Diagnosis not present

## 2020-03-13 DIAGNOSIS — S42201A Unspecified fracture of upper end of right humerus, initial encounter for closed fracture: Secondary | ICD-10-CM | POA: Diagnosis not present

## 2020-03-13 DIAGNOSIS — I82401 Acute embolism and thrombosis of unspecified deep veins of right lower extremity: Secondary | ICD-10-CM | POA: Diagnosis not present

## 2020-03-13 DIAGNOSIS — G250 Essential tremor: Secondary | ICD-10-CM | POA: Diagnosis not present

## 2020-03-13 DIAGNOSIS — I77811 Abdominal aortic ectasia: Secondary | ICD-10-CM | POA: Diagnosis not present

## 2020-03-13 DIAGNOSIS — H8113 Benign paroxysmal vertigo, bilateral: Secondary | ICD-10-CM | POA: Diagnosis not present

## 2020-03-13 DIAGNOSIS — E0789 Other specified disorders of thyroid: Secondary | ICD-10-CM | POA: Diagnosis not present

## 2020-03-13 DIAGNOSIS — Z6836 Body mass index (BMI) 36.0-36.9, adult: Secondary | ICD-10-CM | POA: Diagnosis not present

## 2020-03-13 DIAGNOSIS — Z7409 Other reduced mobility: Secondary | ICD-10-CM | POA: Diagnosis not present

## 2020-03-13 DIAGNOSIS — Z79890 Hormone replacement therapy (postmenopausal): Secondary | ICD-10-CM | POA: Diagnosis not present

## 2020-03-13 DIAGNOSIS — Z780 Asymptomatic menopausal state: Secondary | ICD-10-CM | POA: Diagnosis not present

## 2020-03-13 DIAGNOSIS — H3581 Retinal edema: Secondary | ICD-10-CM | POA: Diagnosis not present

## 2020-03-13 DIAGNOSIS — N952 Postmenopausal atrophic vaginitis: Secondary | ICD-10-CM | POA: Diagnosis not present

## 2020-03-13 DIAGNOSIS — I5189 Other ill-defined heart diseases: Secondary | ICD-10-CM | POA: Diagnosis not present

## 2020-03-13 DIAGNOSIS — H40009 Preglaucoma, unspecified, unspecified eye: Secondary | ICD-10-CM | POA: Diagnosis not present

## 2020-03-13 DIAGNOSIS — H35013 Changes in retinal vascular appearance, bilateral: Secondary | ICD-10-CM | POA: Diagnosis not present

## 2020-03-13 DIAGNOSIS — I472 Ventricular tachycardia: Secondary | ICD-10-CM | POA: Diagnosis not present

## 2020-03-13 DIAGNOSIS — M25652 Stiffness of left hip, not elsewhere classified: Secondary | ICD-10-CM | POA: Diagnosis not present

## 2020-03-13 DIAGNOSIS — I6523 Occlusion and stenosis of bilateral carotid arteries: Secondary | ICD-10-CM | POA: Diagnosis not present

## 2020-03-13 DIAGNOSIS — M75101 Unspecified rotator cuff tear or rupture of right shoulder, not specified as traumatic: Secondary | ICD-10-CM | POA: Diagnosis not present

## 2020-03-13 DIAGNOSIS — H90A22 Sensorineural hearing loss, unilateral, left ear, with restricted hearing on the contralateral side: Secondary | ICD-10-CM | POA: Diagnosis not present

## 2020-03-13 DIAGNOSIS — M6282 Rhabdomyolysis: Secondary | ICD-10-CM | POA: Diagnosis not present

## 2020-03-13 DIAGNOSIS — K635 Polyp of colon: Secondary | ICD-10-CM | POA: Diagnosis not present

## 2020-03-13 DIAGNOSIS — M5417 Radiculopathy, lumbosacral region: Secondary | ICD-10-CM | POA: Diagnosis not present

## 2020-03-13 DIAGNOSIS — Z87898 Personal history of other specified conditions: Secondary | ICD-10-CM | POA: Diagnosis not present

## 2020-03-13 DIAGNOSIS — D739 Disease of spleen, unspecified: Secondary | ICD-10-CM | POA: Diagnosis not present

## 2020-03-13 DIAGNOSIS — R072 Precordial pain: Secondary | ICD-10-CM | POA: Diagnosis not present

## 2020-03-13 DIAGNOSIS — E0590 Thyrotoxicosis, unspecified without thyrotoxic crisis or storm: Secondary | ICD-10-CM | POA: Diagnosis not present

## 2020-03-13 DIAGNOSIS — Z6831 Body mass index (BMI) 31.0-31.9, adult: Secondary | ICD-10-CM | POA: Diagnosis not present

## 2020-03-13 DIAGNOSIS — G8254 Quadriplegia, C5-C7 incomplete: Secondary | ICD-10-CM | POA: Diagnosis not present

## 2020-03-13 DIAGNOSIS — I252 Old myocardial infarction: Secondary | ICD-10-CM | POA: Diagnosis not present

## 2020-03-13 DIAGNOSIS — M85852 Other specified disorders of bone density and structure, left thigh: Secondary | ICD-10-CM | POA: Diagnosis not present

## 2020-03-13 DIAGNOSIS — Z6830 Body mass index (BMI) 30.0-30.9, adult: Secondary | ICD-10-CM | POA: Diagnosis not present

## 2020-03-13 DIAGNOSIS — N3020 Other chronic cystitis without hematuria: Secondary | ICD-10-CM | POA: Diagnosis not present

## 2020-03-13 DIAGNOSIS — H25043 Posterior subcapsular polar age-related cataract, bilateral: Secondary | ICD-10-CM | POA: Diagnosis not present

## 2020-03-13 DIAGNOSIS — S52501A Unspecified fracture of the lower end of right radius, initial encounter for closed fracture: Secondary | ICD-10-CM | POA: Diagnosis not present

## 2020-03-13 DIAGNOSIS — H52203 Unspecified astigmatism, bilateral: Secondary | ICD-10-CM | POA: Diagnosis not present

## 2020-03-13 DIAGNOSIS — J9600 Acute respiratory failure, unspecified whether with hypoxia or hypercapnia: Secondary | ICD-10-CM | POA: Diagnosis not present

## 2020-03-13 DIAGNOSIS — L400 Psoriasis vulgaris: Secondary | ICD-10-CM | POA: Diagnosis not present

## 2020-03-13 DIAGNOSIS — Z8371 Family history of colonic polyps: Secondary | ICD-10-CM | POA: Diagnosis not present

## 2020-03-13 DIAGNOSIS — I5032 Chronic diastolic (congestive) heart failure: Secondary | ICD-10-CM | POA: Diagnosis not present

## 2020-03-13 DIAGNOSIS — H90A31 Mixed conductive and sensorineural hearing loss, unilateral, right ear with restricted hearing on the contralateral side: Secondary | ICD-10-CM | POA: Diagnosis not present

## 2020-03-13 DIAGNOSIS — M47817 Spondylosis without myelopathy or radiculopathy, lumbosacral region: Secondary | ICD-10-CM | POA: Diagnosis not present

## 2020-03-13 DIAGNOSIS — A4151 Sepsis due to Escherichia coli [E. coli]: Secondary | ICD-10-CM | POA: Diagnosis not present

## 2020-03-13 DIAGNOSIS — I724 Aneurysm of artery of lower extremity: Secondary | ICD-10-CM | POA: Diagnosis not present

## 2020-03-13 DIAGNOSIS — G90521 Complex regional pain syndrome I of right lower limb: Secondary | ICD-10-CM | POA: Diagnosis not present

## 2020-03-13 DIAGNOSIS — M67442 Ganglion, left hand: Secondary | ICD-10-CM | POA: Diagnosis not present

## 2020-03-13 DIAGNOSIS — Z6837 Body mass index (BMI) 37.0-37.9, adult: Secondary | ICD-10-CM | POA: Diagnosis not present

## 2020-03-13 DIAGNOSIS — Z96653 Presence of artificial knee joint, bilateral: Secondary | ICD-10-CM | POA: Diagnosis not present

## 2020-03-13 DIAGNOSIS — H401122 Primary open-angle glaucoma, left eye, moderate stage: Secondary | ICD-10-CM | POA: Diagnosis not present

## 2020-03-13 DIAGNOSIS — R002 Palpitations: Secondary | ICD-10-CM | POA: Diagnosis not present

## 2020-03-13 DIAGNOSIS — R768 Other specified abnormal immunological findings in serum: Secondary | ICD-10-CM | POA: Diagnosis not present

## 2020-03-13 DIAGNOSIS — R809 Proteinuria, unspecified: Secondary | ICD-10-CM | POA: Diagnosis not present

## 2020-03-13 DIAGNOSIS — Z01419 Encounter for gynecological examination (general) (routine) without abnormal findings: Secondary | ICD-10-CM | POA: Diagnosis not present

## 2020-03-13 DIAGNOSIS — Z8546 Personal history of malignant neoplasm of prostate: Secondary | ICD-10-CM | POA: Diagnosis not present

## 2020-03-13 DIAGNOSIS — Z6838 Body mass index (BMI) 38.0-38.9, adult: Secondary | ICD-10-CM | POA: Diagnosis not present

## 2020-03-13 DIAGNOSIS — L84 Corns and callosities: Secondary | ICD-10-CM | POA: Diagnosis not present

## 2020-03-13 DIAGNOSIS — R972 Elevated prostate specific antigen [PSA]: Secondary | ICD-10-CM | POA: Diagnosis not present

## 2020-03-13 DIAGNOSIS — R6884 Jaw pain: Secondary | ICD-10-CM | POA: Diagnosis not present

## 2020-03-13 DIAGNOSIS — H527 Unspecified disorder of refraction: Secondary | ICD-10-CM | POA: Diagnosis not present

## 2020-03-13 DIAGNOSIS — J84112 Idiopathic pulmonary fibrosis: Secondary | ICD-10-CM | POA: Diagnosis not present

## 2020-03-13 DIAGNOSIS — M25559 Pain in unspecified hip: Secondary | ICD-10-CM | POA: Diagnosis not present

## 2020-03-13 DIAGNOSIS — R7303 Prediabetes: Secondary | ICD-10-CM | POA: Diagnosis not present

## 2020-03-13 DIAGNOSIS — D044 Carcinoma in situ of skin of scalp and neck: Secondary | ICD-10-CM | POA: Diagnosis not present

## 2020-03-13 DIAGNOSIS — J40 Bronchitis, not specified as acute or chronic: Secondary | ICD-10-CM | POA: Diagnosis not present

## 2020-03-13 DIAGNOSIS — D72829 Elevated white blood cell count, unspecified: Secondary | ICD-10-CM | POA: Diagnosis not present

## 2020-03-13 DIAGNOSIS — M797 Fibromyalgia: Secondary | ICD-10-CM | POA: Diagnosis not present

## 2020-03-13 DIAGNOSIS — L853 Xerosis cutis: Secondary | ICD-10-CM | POA: Diagnosis not present

## 2020-03-13 DIAGNOSIS — I5030 Unspecified diastolic (congestive) heart failure: Secondary | ICD-10-CM | POA: Diagnosis not present

## 2020-03-13 DIAGNOSIS — M4312 Spondylolisthesis, cervical region: Secondary | ICD-10-CM | POA: Diagnosis not present

## 2020-03-13 DIAGNOSIS — H401113 Primary open-angle glaucoma, right eye, severe stage: Secondary | ICD-10-CM | POA: Diagnosis not present

## 2020-03-13 DIAGNOSIS — E611 Iron deficiency: Secondary | ICD-10-CM | POA: Diagnosis not present

## 2020-03-13 DIAGNOSIS — S20361A Insect bite (nonvenomous) of right front wall of thorax, initial encounter: Secondary | ICD-10-CM | POA: Diagnosis not present

## 2020-03-13 DIAGNOSIS — H401133 Primary open-angle glaucoma, bilateral, severe stage: Secondary | ICD-10-CM | POA: Diagnosis not present

## 2020-03-13 DIAGNOSIS — R100 Acute abdomen: Secondary | ICD-10-CM | POA: Diagnosis not present

## 2020-03-13 DIAGNOSIS — E1122 Type 2 diabetes mellitus with diabetic chronic kidney disease: Secondary | ICD-10-CM | POA: Diagnosis not present

## 2020-03-13 DIAGNOSIS — L578 Other skin changes due to chronic exposure to nonionizing radiation: Secondary | ICD-10-CM | POA: Diagnosis not present

## 2020-03-13 DIAGNOSIS — K6389 Other specified diseases of intestine: Secondary | ICD-10-CM | POA: Diagnosis not present

## 2020-03-13 DIAGNOSIS — D6489 Other specified anemias: Secondary | ICD-10-CM | POA: Diagnosis not present

## 2020-03-13 DIAGNOSIS — R110 Nausea: Secondary | ICD-10-CM | POA: Diagnosis not present

## 2020-03-13 DIAGNOSIS — X32XXXS Exposure to sunlight, sequela: Secondary | ICD-10-CM | POA: Diagnosis not present

## 2020-03-13 DIAGNOSIS — M7552 Bursitis of left shoulder: Secondary | ICD-10-CM | POA: Diagnosis not present

## 2020-03-13 DIAGNOSIS — D3705 Neoplasm of uncertain behavior of pharynx: Secondary | ICD-10-CM | POA: Diagnosis not present

## 2020-03-13 DIAGNOSIS — K921 Melena: Secondary | ICD-10-CM | POA: Diagnosis not present

## 2020-03-13 DIAGNOSIS — D229 Melanocytic nevi, unspecified: Secondary | ICD-10-CM | POA: Diagnosis not present

## 2020-03-13 DIAGNOSIS — M8588 Other specified disorders of bone density and structure, other site: Secondary | ICD-10-CM | POA: Diagnosis not present

## 2020-03-13 DIAGNOSIS — G8114 Spastic hemiplegia affecting left nondominant side: Secondary | ICD-10-CM | POA: Diagnosis not present

## 2020-03-13 DIAGNOSIS — C7981 Secondary malignant neoplasm of breast: Secondary | ICD-10-CM | POA: Diagnosis not present

## 2020-03-13 DIAGNOSIS — C44311 Basal cell carcinoma of skin of nose: Secondary | ICD-10-CM | POA: Diagnosis not present

## 2020-03-13 DIAGNOSIS — N281 Cyst of kidney, acquired: Secondary | ICD-10-CM | POA: Diagnosis not present

## 2020-03-13 DIAGNOSIS — Z96611 Presence of right artificial shoulder joint: Secondary | ICD-10-CM | POA: Diagnosis not present

## 2020-03-13 DIAGNOSIS — M7121 Synovial cyst of popliteal space [Baker], right knee: Secondary | ICD-10-CM | POA: Diagnosis not present

## 2020-03-13 DIAGNOSIS — M62830 Muscle spasm of back: Secondary | ICD-10-CM | POA: Diagnosis not present

## 2020-03-13 DIAGNOSIS — F5001 Anorexia nervosa, restricting type: Secondary | ICD-10-CM | POA: Diagnosis not present

## 2020-03-13 DIAGNOSIS — M67879 Other specified disorders of synovium and tendon, unspecified ankle and foot: Secondary | ICD-10-CM | POA: Diagnosis not present

## 2020-03-13 DIAGNOSIS — D638 Anemia in other chronic diseases classified elsewhere: Secondary | ICD-10-CM | POA: Diagnosis not present

## 2020-03-13 DIAGNOSIS — S82241B Displaced spiral fracture of shaft of right tibia, initial encounter for open fracture type I or II: Secondary | ICD-10-CM | POA: Diagnosis not present

## 2020-03-13 DIAGNOSIS — M7918 Myalgia, other site: Secondary | ICD-10-CM | POA: Diagnosis not present

## 2020-03-13 DIAGNOSIS — Z886 Allergy status to analgesic agent status: Secondary | ICD-10-CM | POA: Diagnosis not present

## 2020-03-13 DIAGNOSIS — Z803 Family history of malignant neoplasm of breast: Secondary | ICD-10-CM | POA: Diagnosis not present

## 2020-03-13 DIAGNOSIS — I714 Abdominal aortic aneurysm, without rupture: Secondary | ICD-10-CM | POA: Diagnosis not present

## 2020-03-13 DIAGNOSIS — I255 Ischemic cardiomyopathy: Secondary | ICD-10-CM | POA: Diagnosis not present

## 2020-03-13 DIAGNOSIS — E11319 Type 2 diabetes mellitus with unspecified diabetic retinopathy without macular edema: Secondary | ICD-10-CM | POA: Diagnosis not present

## 2020-03-13 DIAGNOSIS — M059 Rheumatoid arthritis with rheumatoid factor, unspecified: Secondary | ICD-10-CM | POA: Diagnosis not present

## 2020-03-13 DIAGNOSIS — U071 COVID-19: Secondary | ICD-10-CM | POA: Diagnosis not present

## 2020-03-13 DIAGNOSIS — I4892 Unspecified atrial flutter: Secondary | ICD-10-CM | POA: Diagnosis not present

## 2020-03-13 DIAGNOSIS — H353230 Exudative age-related macular degeneration, bilateral, stage unspecified: Secondary | ICD-10-CM | POA: Diagnosis not present

## 2020-03-13 DIAGNOSIS — J300 Vasomotor rhinitis: Secondary | ICD-10-CM | POA: Diagnosis not present

## 2020-03-13 DIAGNOSIS — N182 Chronic kidney disease, stage 2 (mild): Secondary | ICD-10-CM | POA: Diagnosis not present

## 2020-03-13 DIAGNOSIS — G479 Sleep disorder, unspecified: Secondary | ICD-10-CM | POA: Diagnosis not present

## 2020-03-13 DIAGNOSIS — I428 Other cardiomyopathies: Secondary | ICD-10-CM | POA: Diagnosis not present

## 2020-03-13 DIAGNOSIS — M48062 Spinal stenosis, lumbar region with neurogenic claudication: Secondary | ICD-10-CM | POA: Diagnosis not present

## 2020-03-13 DIAGNOSIS — R350 Frequency of micturition: Secondary | ICD-10-CM | POA: Diagnosis not present

## 2020-03-13 DIAGNOSIS — M7581 Other shoulder lesions, right shoulder: Secondary | ICD-10-CM | POA: Diagnosis not present

## 2020-03-13 DIAGNOSIS — N1832 Chronic kidney disease, stage 3b: Secondary | ICD-10-CM | POA: Diagnosis not present

## 2020-03-13 DIAGNOSIS — H02836 Dermatochalasis of left eye, unspecified eyelid: Secondary | ICD-10-CM | POA: Diagnosis not present

## 2020-03-13 DIAGNOSIS — M5137 Other intervertebral disc degeneration, lumbosacral region: Secondary | ICD-10-CM | POA: Diagnosis not present

## 2020-03-13 DIAGNOSIS — C8305 Small cell B-cell lymphoma, lymph nodes of inguinal region and lower limb: Secondary | ICD-10-CM | POA: Diagnosis not present

## 2020-03-13 DIAGNOSIS — D2262 Melanocytic nevi of left upper limb, including shoulder: Secondary | ICD-10-CM | POA: Diagnosis not present

## 2020-03-13 DIAGNOSIS — R1010 Upper abdominal pain, unspecified: Secondary | ICD-10-CM | POA: Diagnosis not present

## 2020-03-13 DIAGNOSIS — Z6834 Body mass index (BMI) 34.0-34.9, adult: Secondary | ICD-10-CM | POA: Diagnosis not present

## 2020-03-13 DIAGNOSIS — R0781 Pleurodynia: Secondary | ICD-10-CM | POA: Diagnosis not present

## 2020-03-13 DIAGNOSIS — E039 Hypothyroidism, unspecified: Secondary | ICD-10-CM | POA: Diagnosis not present

## 2020-03-13 DIAGNOSIS — C3411 Malignant neoplasm of upper lobe, right bronchus or lung: Secondary | ICD-10-CM | POA: Diagnosis not present

## 2020-03-13 DIAGNOSIS — I951 Orthostatic hypotension: Secondary | ICD-10-CM | POA: Diagnosis not present

## 2020-03-13 DIAGNOSIS — H11152 Pinguecula, left eye: Secondary | ICD-10-CM | POA: Diagnosis not present

## 2020-03-13 DIAGNOSIS — M25461 Effusion, right knee: Secondary | ICD-10-CM | POA: Diagnosis not present

## 2020-03-13 DIAGNOSIS — M130 Polyarthritis, unspecified: Secondary | ICD-10-CM | POA: Diagnosis not present

## 2020-03-13 DIAGNOSIS — E1159 Type 2 diabetes mellitus with other circulatory complications: Secondary | ICD-10-CM | POA: Diagnosis not present

## 2020-03-13 DIAGNOSIS — K7689 Other specified diseases of liver: Secondary | ICD-10-CM | POA: Diagnosis not present

## 2020-03-13 DIAGNOSIS — M19011 Primary osteoarthritis, right shoulder: Secondary | ICD-10-CM | POA: Diagnosis not present

## 2020-03-13 DIAGNOSIS — I420 Dilated cardiomyopathy: Secondary | ICD-10-CM | POA: Diagnosis not present

## 2020-03-13 DIAGNOSIS — M19072 Primary osteoarthritis, left ankle and foot: Secondary | ICD-10-CM | POA: Diagnosis not present

## 2020-03-13 DIAGNOSIS — R1030 Lower abdominal pain, unspecified: Secondary | ICD-10-CM | POA: Diagnosis not present

## 2020-03-13 DIAGNOSIS — E11610 Type 2 diabetes mellitus with diabetic neuropathic arthropathy: Secondary | ICD-10-CM | POA: Diagnosis not present

## 2020-03-13 DIAGNOSIS — E291 Testicular hypofunction: Secondary | ICD-10-CM | POA: Diagnosis not present

## 2020-03-13 DIAGNOSIS — Z13220 Encounter for screening for lipoid disorders: Secondary | ICD-10-CM | POA: Diagnosis not present

## 2020-03-13 DIAGNOSIS — M79604 Pain in right leg: Secondary | ICD-10-CM | POA: Diagnosis not present

## 2020-03-13 DIAGNOSIS — D235 Other benign neoplasm of skin of trunk: Secondary | ICD-10-CM | POA: Diagnosis not present

## 2020-03-13 DIAGNOSIS — D2272 Melanocytic nevi of left lower limb, including hip: Secondary | ICD-10-CM | POA: Diagnosis not present

## 2020-03-13 DIAGNOSIS — J4540 Moderate persistent asthma, uncomplicated: Secondary | ICD-10-CM | POA: Diagnosis not present

## 2020-03-13 DIAGNOSIS — C44722 Squamous cell carcinoma of skin of right lower limb, including hip: Secondary | ICD-10-CM | POA: Diagnosis not present

## 2020-03-13 DIAGNOSIS — I119 Hypertensive heart disease without heart failure: Secondary | ICD-10-CM | POA: Diagnosis not present

## 2020-03-13 DIAGNOSIS — D126 Benign neoplasm of colon, unspecified: Secondary | ICD-10-CM | POA: Diagnosis not present

## 2020-03-13 DIAGNOSIS — K529 Noninfective gastroenteritis and colitis, unspecified: Secondary | ICD-10-CM | POA: Diagnosis not present

## 2020-03-13 DIAGNOSIS — J449 Chronic obstructive pulmonary disease, unspecified: Secondary | ICD-10-CM | POA: Diagnosis not present

## 2020-03-13 DIAGNOSIS — M8949 Other hypertrophic osteoarthropathy, multiple sites: Secondary | ICD-10-CM | POA: Diagnosis not present

## 2020-03-13 DIAGNOSIS — M533 Sacrococcygeal disorders, not elsewhere classified: Secondary | ICD-10-CM | POA: Diagnosis not present

## 2020-03-13 DIAGNOSIS — N1330 Unspecified hydronephrosis: Secondary | ICD-10-CM | POA: Diagnosis not present

## 2020-03-13 DIAGNOSIS — Z86010 Personal history of colonic polyps: Secondary | ICD-10-CM | POA: Diagnosis not present

## 2020-03-13 DIAGNOSIS — C44729 Squamous cell carcinoma of skin of left lower limb, including hip: Secondary | ICD-10-CM | POA: Diagnosis not present

## 2020-03-13 DIAGNOSIS — D0512 Intraductal carcinoma in situ of left breast: Secondary | ICD-10-CM | POA: Diagnosis not present

## 2020-03-13 DIAGNOSIS — I480 Paroxysmal atrial fibrillation: Secondary | ICD-10-CM | POA: Diagnosis not present

## 2020-03-13 DIAGNOSIS — M79645 Pain in left finger(s): Secondary | ICD-10-CM | POA: Diagnosis not present

## 2020-03-13 DIAGNOSIS — G6189 Other inflammatory polyneuropathies: Secondary | ICD-10-CM | POA: Diagnosis not present

## 2020-03-13 DIAGNOSIS — G893 Neoplasm related pain (acute) (chronic): Secondary | ICD-10-CM | POA: Diagnosis not present

## 2020-03-13 DIAGNOSIS — R079 Chest pain, unspecified: Secondary | ICD-10-CM | POA: Diagnosis not present

## 2020-03-13 DIAGNOSIS — L97412 Non-pressure chronic ulcer of right heel and midfoot with fat layer exposed: Secondary | ICD-10-CM | POA: Diagnosis not present

## 2020-03-13 DIAGNOSIS — R5382 Chronic fatigue, unspecified: Secondary | ICD-10-CM | POA: Diagnosis not present

## 2020-03-13 DIAGNOSIS — C250 Malignant neoplasm of head of pancreas: Secondary | ICD-10-CM | POA: Diagnosis not present

## 2020-03-13 DIAGNOSIS — Z9682 Presence of neurostimulator: Secondary | ICD-10-CM | POA: Diagnosis not present

## 2020-03-13 DIAGNOSIS — Z794 Long term (current) use of insulin: Secondary | ICD-10-CM | POA: Diagnosis not present

## 2020-03-13 DIAGNOSIS — R180 Malignant ascites: Secondary | ICD-10-CM | POA: Diagnosis not present

## 2020-03-13 DIAGNOSIS — D0461 Carcinoma in situ of skin of right upper limb, including shoulder: Secondary | ICD-10-CM | POA: Diagnosis not present

## 2020-03-13 DIAGNOSIS — H52223 Regular astigmatism, bilateral: Secondary | ICD-10-CM | POA: Diagnosis not present

## 2020-03-13 DIAGNOSIS — M961 Postlaminectomy syndrome, not elsewhere classified: Secondary | ICD-10-CM | POA: Diagnosis not present

## 2020-03-13 DIAGNOSIS — E8881 Metabolic syndrome: Secondary | ICD-10-CM | POA: Diagnosis not present

## 2020-03-13 DIAGNOSIS — I712 Thoracic aortic aneurysm, without rupture: Secondary | ICD-10-CM | POA: Diagnosis not present

## 2020-03-13 DIAGNOSIS — M150 Primary generalized (osteo)arthritis: Secondary | ICD-10-CM | POA: Diagnosis not present

## 2020-03-13 DIAGNOSIS — S72031D Displaced midcervical fracture of right femur, subsequent encounter for closed fracture with routine healing: Secondary | ICD-10-CM | POA: Diagnosis not present

## 2020-03-13 DIAGNOSIS — H65191 Other acute nonsuppurative otitis media, right ear: Secondary | ICD-10-CM | POA: Diagnosis not present

## 2020-03-13 DIAGNOSIS — H16223 Keratoconjunctivitis sicca, not specified as Sjogren's, bilateral: Secondary | ICD-10-CM | POA: Diagnosis not present

## 2020-03-13 DIAGNOSIS — F5104 Psychophysiologic insomnia: Secondary | ICD-10-CM | POA: Diagnosis not present

## 2020-03-13 DIAGNOSIS — Z820 Family history of epilepsy and other diseases of the nervous system: Secondary | ICD-10-CM | POA: Diagnosis not present

## 2020-03-13 DIAGNOSIS — R32 Unspecified urinary incontinence: Secondary | ICD-10-CM | POA: Diagnosis not present

## 2020-03-13 DIAGNOSIS — K591 Functional diarrhea: Secondary | ICD-10-CM | POA: Diagnosis not present

## 2020-03-13 DIAGNOSIS — F17210 Nicotine dependence, cigarettes, uncomplicated: Secondary | ICD-10-CM | POA: Diagnosis not present

## 2020-03-13 DIAGNOSIS — M5136 Other intervertebral disc degeneration, lumbar region: Secondary | ICD-10-CM | POA: Diagnosis not present

## 2020-03-13 DIAGNOSIS — Z902 Acquired absence of lung [part of]: Secondary | ICD-10-CM | POA: Diagnosis not present

## 2020-03-13 DIAGNOSIS — Z5181 Encounter for therapeutic drug level monitoring: Secondary | ICD-10-CM | POA: Diagnosis not present

## 2020-03-13 DIAGNOSIS — C44529 Squamous cell carcinoma of skin of other part of trunk: Secondary | ICD-10-CM | POA: Diagnosis not present

## 2020-03-13 DIAGNOSIS — M25651 Stiffness of right hip, not elsewhere classified: Secondary | ICD-10-CM | POA: Diagnosis not present

## 2020-03-13 DIAGNOSIS — C50919 Malignant neoplasm of unspecified site of unspecified female breast: Secondary | ICD-10-CM | POA: Diagnosis not present

## 2020-03-13 DIAGNOSIS — K0889 Other specified disorders of teeth and supporting structures: Secondary | ICD-10-CM | POA: Diagnosis not present

## 2020-03-13 DIAGNOSIS — M79674 Pain in right toe(s): Secondary | ICD-10-CM | POA: Diagnosis not present

## 2020-03-13 DIAGNOSIS — F0390 Unspecified dementia without behavioral disturbance: Secondary | ICD-10-CM | POA: Diagnosis not present

## 2020-03-13 DIAGNOSIS — B351 Tinea unguium: Secondary | ICD-10-CM | POA: Diagnosis not present

## 2020-03-13 DIAGNOSIS — Z471 Aftercare following joint replacement surgery: Secondary | ICD-10-CM | POA: Diagnosis not present

## 2020-03-13 DIAGNOSIS — Z9660 Presence of unspecified orthopedic joint implant: Secondary | ICD-10-CM | POA: Diagnosis not present

## 2020-03-13 DIAGNOSIS — K449 Diaphragmatic hernia without obstruction or gangrene: Secondary | ICD-10-CM | POA: Diagnosis not present

## 2020-03-13 DIAGNOSIS — M25662 Stiffness of left knee, not elsewhere classified: Secondary | ICD-10-CM | POA: Diagnosis not present

## 2020-03-13 DIAGNOSIS — E669 Obesity, unspecified: Secondary | ICD-10-CM | POA: Diagnosis not present

## 2020-03-13 DIAGNOSIS — D0472 Carcinoma in situ of skin of left lower limb, including hip: Secondary | ICD-10-CM | POA: Diagnosis not present

## 2020-03-13 DIAGNOSIS — F419 Anxiety disorder, unspecified: Secondary | ICD-10-CM | POA: Diagnosis not present

## 2020-03-13 DIAGNOSIS — E211 Secondary hyperparathyroidism, not elsewhere classified: Secondary | ICD-10-CM | POA: Diagnosis not present

## 2020-03-13 DIAGNOSIS — I5041 Acute combined systolic (congestive) and diastolic (congestive) heart failure: Secondary | ICD-10-CM | POA: Diagnosis not present

## 2020-03-13 DIAGNOSIS — I8510 Secondary esophageal varices without bleeding: Secondary | ICD-10-CM | POA: Diagnosis not present

## 2020-03-13 DIAGNOSIS — K648 Other hemorrhoids: Secondary | ICD-10-CM | POA: Diagnosis not present

## 2020-03-13 DIAGNOSIS — M3219 Other organ or system involvement in systemic lupus erythematosus: Secondary | ICD-10-CM | POA: Diagnosis not present

## 2020-03-13 DIAGNOSIS — Z6839 Body mass index (BMI) 39.0-39.9, adult: Secondary | ICD-10-CM | POA: Diagnosis not present

## 2020-03-13 DIAGNOSIS — E109 Type 1 diabetes mellitus without complications: Secondary | ICD-10-CM | POA: Diagnosis not present

## 2020-03-13 DIAGNOSIS — Z800 Family history of malignant neoplasm of digestive organs: Secondary | ICD-10-CM | POA: Diagnosis not present

## 2020-03-13 DIAGNOSIS — E441 Mild protein-calorie malnutrition: Secondary | ICD-10-CM | POA: Diagnosis not present

## 2020-03-13 DIAGNOSIS — N5201 Erectile dysfunction due to arterial insufficiency: Secondary | ICD-10-CM | POA: Diagnosis not present

## 2020-03-13 DIAGNOSIS — Z6822 Body mass index (BMI) 22.0-22.9, adult: Secondary | ICD-10-CM | POA: Diagnosis not present

## 2020-03-13 DIAGNOSIS — L570 Actinic keratosis: Secondary | ICD-10-CM | POA: Diagnosis not present

## 2020-03-13 DIAGNOSIS — M1000 Idiopathic gout, unspecified site: Secondary | ICD-10-CM | POA: Diagnosis not present

## 2020-03-13 DIAGNOSIS — Z6835 Body mass index (BMI) 35.0-35.9, adult: Secondary | ICD-10-CM | POA: Diagnosis not present

## 2020-03-13 DIAGNOSIS — H2513 Age-related nuclear cataract, bilateral: Secondary | ICD-10-CM | POA: Diagnosis not present

## 2020-03-13 DIAGNOSIS — Z87891 Personal history of nicotine dependence: Secondary | ICD-10-CM | POA: Diagnosis not present

## 2020-03-13 DIAGNOSIS — E2740 Unspecified adrenocortical insufficiency: Secondary | ICD-10-CM | POA: Diagnosis not present

## 2020-03-13 DIAGNOSIS — F0391 Unspecified dementia with behavioral disturbance: Secondary | ICD-10-CM | POA: Diagnosis not present

## 2020-03-13 DIAGNOSIS — N3281 Overactive bladder: Secondary | ICD-10-CM | POA: Diagnosis not present

## 2020-03-13 DIAGNOSIS — C186 Malignant neoplasm of descending colon: Secondary | ICD-10-CM | POA: Diagnosis not present

## 2020-03-13 DIAGNOSIS — R4182 Altered mental status, unspecified: Secondary | ICD-10-CM | POA: Diagnosis not present

## 2020-03-13 DIAGNOSIS — H401131 Primary open-angle glaucoma, bilateral, mild stage: Secondary | ICD-10-CM | POA: Diagnosis not present

## 2020-03-13 DIAGNOSIS — I25119 Atherosclerotic heart disease of native coronary artery with unspecified angina pectoris: Secondary | ICD-10-CM | POA: Diagnosis not present

## 2020-03-13 DIAGNOSIS — I110 Hypertensive heart disease with heart failure: Secondary | ICD-10-CM | POA: Diagnosis not present

## 2020-03-13 DIAGNOSIS — M205X1 Other deformities of toe(s) (acquired), right foot: Secondary | ICD-10-CM | POA: Diagnosis not present

## 2020-03-13 DIAGNOSIS — M47812 Spondylosis without myelopathy or radiculopathy, cervical region: Secondary | ICD-10-CM | POA: Diagnosis not present

## 2020-03-13 DIAGNOSIS — R918 Other nonspecific abnormal finding of lung field: Secondary | ICD-10-CM | POA: Diagnosis not present

## 2020-03-13 DIAGNOSIS — R0789 Other chest pain: Secondary | ICD-10-CM | POA: Diagnosis not present

## 2020-03-13 DIAGNOSIS — R933 Abnormal findings on diagnostic imaging of other parts of digestive tract: Secondary | ICD-10-CM | POA: Diagnosis not present

## 2020-03-13 DIAGNOSIS — I482 Chronic atrial fibrillation, unspecified: Secondary | ICD-10-CM | POA: Diagnosis not present

## 2020-03-13 DIAGNOSIS — D4620 Refractory anemia with excess of blasts, unspecified: Secondary | ICD-10-CM | POA: Diagnosis not present

## 2020-03-13 DIAGNOSIS — Z23 Encounter for immunization: Secondary | ICD-10-CM | POA: Diagnosis not present

## 2020-03-13 DIAGNOSIS — R0989 Other specified symptoms and signs involving the circulatory and respiratory systems: Secondary | ICD-10-CM | POA: Diagnosis not present

## 2020-03-13 DIAGNOSIS — Z95810 Presence of automatic (implantable) cardiac defibrillator: Secondary | ICD-10-CM | POA: Diagnosis not present

## 2020-03-13 DIAGNOSIS — M6588 Other synovitis and tenosynovitis, other site: Secondary | ICD-10-CM | POA: Diagnosis not present

## 2020-03-13 DIAGNOSIS — H9311 Tinnitus, right ear: Secondary | ICD-10-CM | POA: Diagnosis not present

## 2020-03-13 DIAGNOSIS — Z8659 Personal history of other mental and behavioral disorders: Secondary | ICD-10-CM | POA: Diagnosis not present

## 2020-03-13 DIAGNOSIS — M4186 Other forms of scoliosis, lumbar region: Secondary | ICD-10-CM | POA: Diagnosis not present

## 2020-03-13 DIAGNOSIS — I493 Ventricular premature depolarization: Secondary | ICD-10-CM | POA: Diagnosis not present

## 2020-03-13 DIAGNOSIS — J189 Pneumonia, unspecified organism: Secondary | ICD-10-CM | POA: Diagnosis not present

## 2020-03-13 DIAGNOSIS — Z191 Hormone sensitive malignancy status: Secondary | ICD-10-CM | POA: Diagnosis not present

## 2020-03-13 DIAGNOSIS — M79661 Pain in right lower leg: Secondary | ICD-10-CM | POA: Diagnosis not present

## 2020-03-13 DIAGNOSIS — K625 Hemorrhage of anus and rectum: Secondary | ICD-10-CM | POA: Diagnosis not present

## 2020-03-13 DIAGNOSIS — C9000 Multiple myeloma not having achieved remission: Secondary | ICD-10-CM | POA: Diagnosis not present

## 2020-03-13 DIAGNOSIS — M179 Osteoarthritis of knee, unspecified: Secondary | ICD-10-CM | POA: Diagnosis not present

## 2020-03-13 DIAGNOSIS — E113393 Type 2 diabetes mellitus with moderate nonproliferative diabetic retinopathy without macular edema, bilateral: Secondary | ICD-10-CM | POA: Diagnosis not present

## 2020-03-13 DIAGNOSIS — H6122 Impacted cerumen, left ear: Secondary | ICD-10-CM | POA: Diagnosis not present

## 2020-03-13 DIAGNOSIS — N184 Chronic kidney disease, stage 4 (severe): Secondary | ICD-10-CM | POA: Diagnosis not present

## 2020-03-13 DIAGNOSIS — M75111 Incomplete rotator cuff tear or rupture of right shoulder, not specified as traumatic: Secondary | ICD-10-CM | POA: Diagnosis not present

## 2020-03-13 DIAGNOSIS — E785 Hyperlipidemia, unspecified: Secondary | ICD-10-CM | POA: Diagnosis not present

## 2020-03-13 DIAGNOSIS — F17219 Nicotine dependence, cigarettes, with unspecified nicotine-induced disorders: Secondary | ICD-10-CM | POA: Diagnosis not present

## 2020-03-13 DIAGNOSIS — A401 Sepsis due to streptococcus, group B: Secondary | ICD-10-CM | POA: Diagnosis not present

## 2020-03-13 DIAGNOSIS — X32XXXD Exposure to sunlight, subsequent encounter: Secondary | ICD-10-CM | POA: Diagnosis not present

## 2020-03-13 DIAGNOSIS — H0102B Squamous blepharitis left eye, upper and lower eyelids: Secondary | ICD-10-CM | POA: Diagnosis not present

## 2020-03-13 DIAGNOSIS — I890 Lymphedema, not elsewhere classified: Secondary | ICD-10-CM | POA: Diagnosis not present

## 2020-03-13 DIAGNOSIS — G500 Trigeminal neuralgia: Secondary | ICD-10-CM | POA: Diagnosis not present

## 2020-03-13 DIAGNOSIS — Z8262 Family history of osteoporosis: Secondary | ICD-10-CM | POA: Diagnosis not present

## 2020-03-13 DIAGNOSIS — N950 Postmenopausal bleeding: Secondary | ICD-10-CM | POA: Diagnosis not present

## 2020-03-13 DIAGNOSIS — Z947 Corneal transplant status: Secondary | ICD-10-CM | POA: Diagnosis not present

## 2020-03-13 DIAGNOSIS — I4821 Permanent atrial fibrillation: Secondary | ICD-10-CM | POA: Diagnosis not present

## 2020-03-13 DIAGNOSIS — H409 Unspecified glaucoma: Secondary | ICD-10-CM | POA: Diagnosis not present

## 2020-03-13 DIAGNOSIS — C44619 Basal cell carcinoma of skin of left upper limb, including shoulder: Secondary | ICD-10-CM | POA: Diagnosis not present

## 2020-03-13 DIAGNOSIS — M2042 Other hammer toe(s) (acquired), left foot: Secondary | ICD-10-CM | POA: Diagnosis not present

## 2020-03-13 DIAGNOSIS — R21 Rash and other nonspecific skin eruption: Secondary | ICD-10-CM | POA: Diagnosis not present

## 2020-03-13 DIAGNOSIS — I70213 Atherosclerosis of native arteries of extremities with intermittent claudication, bilateral legs: Secondary | ICD-10-CM | POA: Diagnosis not present

## 2020-03-13 DIAGNOSIS — H524 Presbyopia: Secondary | ICD-10-CM | POA: Diagnosis not present

## 2020-03-13 DIAGNOSIS — C44202 Unspecified malignant neoplasm of skin of right ear and external auricular canal: Secondary | ICD-10-CM | POA: Diagnosis not present

## 2020-03-13 DIAGNOSIS — C44519 Basal cell carcinoma of skin of other part of trunk: Secondary | ICD-10-CM | POA: Diagnosis not present

## 2020-03-13 DIAGNOSIS — K50919 Crohn's disease, unspecified, with unspecified complications: Secondary | ICD-10-CM | POA: Diagnosis not present

## 2020-03-13 DIAGNOSIS — M5106 Intervertebral disc disorders with myelopathy, lumbar region: Secondary | ICD-10-CM | POA: Diagnosis not present

## 2020-03-13 DIAGNOSIS — R601 Generalized edema: Secondary | ICD-10-CM | POA: Diagnosis not present

## 2020-03-13 DIAGNOSIS — M2392 Unspecified internal derangement of left knee: Secondary | ICD-10-CM | POA: Diagnosis not present

## 2020-03-13 DIAGNOSIS — N8189 Other female genital prolapse: Secondary | ICD-10-CM | POA: Diagnosis not present

## 2020-03-13 DIAGNOSIS — M24562 Contracture, left knee: Secondary | ICD-10-CM | POA: Diagnosis not present

## 2020-03-13 DIAGNOSIS — M869 Osteomyelitis, unspecified: Secondary | ICD-10-CM | POA: Diagnosis not present

## 2020-03-13 DIAGNOSIS — L97312 Non-pressure chronic ulcer of right ankle with fat layer exposed: Secondary | ICD-10-CM | POA: Diagnosis not present

## 2020-03-13 DIAGNOSIS — F3342 Major depressive disorder, recurrent, in full remission: Secondary | ICD-10-CM | POA: Diagnosis not present

## 2020-03-13 DIAGNOSIS — D693 Immune thrombocytopenic purpura: Secondary | ICD-10-CM | POA: Diagnosis not present

## 2020-03-13 DIAGNOSIS — H00011 Hordeolum externum right upper eyelid: Secondary | ICD-10-CM | POA: Diagnosis not present

## 2020-03-13 DIAGNOSIS — R2689 Other abnormalities of gait and mobility: Secondary | ICD-10-CM | POA: Diagnosis not present

## 2020-03-13 DIAGNOSIS — Z0000 Encounter for general adult medical examination without abnormal findings: Secondary | ICD-10-CM | POA: Diagnosis not present

## 2020-03-13 DIAGNOSIS — I350 Nonrheumatic aortic (valve) stenosis: Secondary | ICD-10-CM | POA: Diagnosis not present

## 2020-03-13 DIAGNOSIS — Z7901 Long term (current) use of anticoagulants: Secondary | ICD-10-CM | POA: Diagnosis not present

## 2020-03-13 DIAGNOSIS — Z4689 Encounter for fitting and adjustment of other specified devices: Secondary | ICD-10-CM | POA: Diagnosis not present

## 2020-03-13 DIAGNOSIS — R5381 Other malaise: Secondary | ICD-10-CM | POA: Diagnosis not present

## 2020-03-13 DIAGNOSIS — Z85810 Personal history of malignant neoplasm of tongue: Secondary | ICD-10-CM | POA: Diagnosis not present

## 2020-03-13 DIAGNOSIS — M24561 Contracture, right knee: Secondary | ICD-10-CM | POA: Diagnosis not present

## 2020-03-13 DIAGNOSIS — R000 Tachycardia, unspecified: Secondary | ICD-10-CM | POA: Diagnosis not present

## 2020-03-13 DIAGNOSIS — Z8249 Family history of ischemic heart disease and other diseases of the circulatory system: Secondary | ICD-10-CM | POA: Diagnosis not present

## 2020-03-13 DIAGNOSIS — J9610 Chronic respiratory failure, unspecified whether with hypoxia or hypercapnia: Secondary | ICD-10-CM | POA: Diagnosis not present

## 2020-03-13 DIAGNOSIS — F39 Unspecified mood [affective] disorder: Secondary | ICD-10-CM | POA: Diagnosis not present

## 2020-03-13 DIAGNOSIS — E782 Mixed hyperlipidemia: Secondary | ICD-10-CM | POA: Diagnosis not present

## 2020-03-13 DIAGNOSIS — L239 Allergic contact dermatitis, unspecified cause: Secondary | ICD-10-CM | POA: Diagnosis not present

## 2020-03-13 DIAGNOSIS — T25322A Burn of third degree of left foot, initial encounter: Secondary | ICD-10-CM | POA: Diagnosis not present

## 2020-03-13 DIAGNOSIS — H353133 Nonexudative age-related macular degeneration, bilateral, advanced atrophic without subfoveal involvement: Secondary | ICD-10-CM | POA: Diagnosis not present

## 2020-03-13 DIAGNOSIS — N3090 Cystitis, unspecified without hematuria: Secondary | ICD-10-CM | POA: Diagnosis not present

## 2020-03-13 DIAGNOSIS — M109 Gout, unspecified: Secondary | ICD-10-CM | POA: Diagnosis not present

## 2020-03-13 DIAGNOSIS — C50911 Malignant neoplasm of unspecified site of right female breast: Secondary | ICD-10-CM | POA: Diagnosis not present

## 2020-03-13 DIAGNOSIS — M13861 Other specified arthritis, right knee: Secondary | ICD-10-CM | POA: Diagnosis not present

## 2020-03-13 DIAGNOSIS — R269 Unspecified abnormalities of gait and mobility: Secondary | ICD-10-CM | POA: Diagnosis not present

## 2020-03-13 DIAGNOSIS — M9904 Segmental and somatic dysfunction of sacral region: Secondary | ICD-10-CM | POA: Diagnosis not present

## 2020-03-13 DIAGNOSIS — L298 Other pruritus: Secondary | ICD-10-CM | POA: Diagnosis not present

## 2020-03-13 DIAGNOSIS — E2839 Other primary ovarian failure: Secondary | ICD-10-CM | POA: Diagnosis not present

## 2020-03-13 DIAGNOSIS — M7989 Other specified soft tissue disorders: Secondary | ICD-10-CM | POA: Diagnosis not present

## 2020-03-13 DIAGNOSIS — C679 Malignant neoplasm of bladder, unspecified: Secondary | ICD-10-CM | POA: Diagnosis not present

## 2020-03-13 DIAGNOSIS — M25551 Pain in right hip: Secondary | ICD-10-CM | POA: Diagnosis not present

## 2020-03-13 DIAGNOSIS — M6208 Separation of muscle (nontraumatic), other site: Secondary | ICD-10-CM | POA: Diagnosis not present

## 2020-03-13 DIAGNOSIS — M12811 Other specific arthropathies, not elsewhere classified, right shoulder: Secondary | ICD-10-CM | POA: Diagnosis not present

## 2020-03-13 DIAGNOSIS — J309 Allergic rhinitis, unspecified: Secondary | ICD-10-CM | POA: Diagnosis not present

## 2020-03-13 DIAGNOSIS — I872 Venous insufficiency (chronic) (peripheral): Secondary | ICD-10-CM | POA: Diagnosis not present

## 2020-03-13 DIAGNOSIS — H401111 Primary open-angle glaucoma, right eye, mild stage: Secondary | ICD-10-CM | POA: Diagnosis not present

## 2020-03-13 DIAGNOSIS — I779 Disorder of arteries and arterioles, unspecified: Secondary | ICD-10-CM | POA: Diagnosis not present

## 2020-03-13 DIAGNOSIS — M25562 Pain in left knee: Secondary | ICD-10-CM | POA: Diagnosis not present

## 2020-03-13 DIAGNOSIS — J8410 Pulmonary fibrosis, unspecified: Secondary | ICD-10-CM | POA: Diagnosis not present

## 2020-03-13 DIAGNOSIS — L97322 Non-pressure chronic ulcer of left ankle with fat layer exposed: Secondary | ICD-10-CM | POA: Diagnosis not present

## 2020-03-13 DIAGNOSIS — L817 Pigmented purpuric dermatosis: Secondary | ICD-10-CM | POA: Diagnosis not present

## 2020-03-13 DIAGNOSIS — Z9079 Acquired absence of other genital organ(s): Secondary | ICD-10-CM | POA: Diagnosis not present

## 2020-03-13 DIAGNOSIS — N6321 Unspecified lump in the left breast, upper outer quadrant: Secondary | ICD-10-CM | POA: Diagnosis not present

## 2020-03-13 DIAGNOSIS — T402X5A Adverse effect of other opioids, initial encounter: Secondary | ICD-10-CM | POA: Diagnosis not present

## 2020-03-13 DIAGNOSIS — D696 Thrombocytopenia, unspecified: Secondary | ICD-10-CM | POA: Diagnosis not present

## 2020-03-13 DIAGNOSIS — C661 Malignant neoplasm of right ureter: Secondary | ICD-10-CM | POA: Diagnosis not present

## 2020-03-13 DIAGNOSIS — G5621 Lesion of ulnar nerve, right upper limb: Secondary | ICD-10-CM | POA: Diagnosis not present

## 2020-03-13 DIAGNOSIS — L97511 Non-pressure chronic ulcer of other part of right foot limited to breakdown of skin: Secondary | ICD-10-CM | POA: Diagnosis not present

## 2020-03-13 DIAGNOSIS — N521 Erectile dysfunction due to diseases classified elsewhere: Secondary | ICD-10-CM | POA: Diagnosis not present

## 2020-03-13 DIAGNOSIS — R928 Other abnormal and inconclusive findings on diagnostic imaging of breast: Secondary | ICD-10-CM | POA: Diagnosis not present

## 2020-03-13 DIAGNOSIS — N814 Uterovaginal prolapse, unspecified: Secondary | ICD-10-CM | POA: Diagnosis not present

## 2020-03-13 DIAGNOSIS — Z9889 Other specified postprocedural states: Secondary | ICD-10-CM | POA: Diagnosis not present

## 2020-03-13 DIAGNOSIS — L728 Other follicular cysts of the skin and subcutaneous tissue: Secondary | ICD-10-CM | POA: Diagnosis not present

## 2020-03-13 DIAGNOSIS — H5000 Unspecified esotropia: Secondary | ICD-10-CM | POA: Diagnosis not present

## 2020-03-13 DIAGNOSIS — I483 Typical atrial flutter: Secondary | ICD-10-CM | POA: Diagnosis not present

## 2020-03-13 DIAGNOSIS — H81399 Other peripheral vertigo, unspecified ear: Secondary | ICD-10-CM | POA: Diagnosis not present

## 2020-03-13 DIAGNOSIS — Z170 Estrogen receptor positive status [ER+]: Secondary | ICD-10-CM | POA: Diagnosis not present

## 2020-03-13 DIAGNOSIS — K2100 Gastro-esophageal reflux disease with esophagitis, without bleeding: Secondary | ICD-10-CM | POA: Diagnosis not present

## 2020-03-13 DIAGNOSIS — M19071 Primary osteoarthritis, right ankle and foot: Secondary | ICD-10-CM | POA: Diagnosis not present

## 2020-03-13 DIAGNOSIS — M2141 Flat foot [pes planus] (acquired), right foot: Secondary | ICD-10-CM | POA: Diagnosis not present

## 2020-03-13 DIAGNOSIS — R05 Cough: Secondary | ICD-10-CM | POA: Diagnosis not present

## 2020-03-13 DIAGNOSIS — E1129 Type 2 diabetes mellitus with other diabetic kidney complication: Secondary | ICD-10-CM | POA: Diagnosis not present

## 2020-03-13 DIAGNOSIS — R930 Abnormal findings on diagnostic imaging of skull and head, not elsewhere classified: Secondary | ICD-10-CM | POA: Diagnosis not present

## 2020-03-13 DIAGNOSIS — I491 Atrial premature depolarization: Secondary | ICD-10-CM | POA: Diagnosis not present

## 2020-03-13 DIAGNOSIS — R0600 Dyspnea, unspecified: Secondary | ICD-10-CM | POA: Diagnosis not present

## 2020-03-13 DIAGNOSIS — R252 Cramp and spasm: Secondary | ICD-10-CM | POA: Diagnosis not present

## 2020-03-13 DIAGNOSIS — I129 Hypertensive chronic kidney disease with stage 1 through stage 4 chronic kidney disease, or unspecified chronic kidney disease: Secondary | ICD-10-CM | POA: Diagnosis not present

## 2020-03-13 DIAGNOSIS — I5043 Acute on chronic combined systolic (congestive) and diastolic (congestive) heart failure: Secondary | ICD-10-CM | POA: Diagnosis not present

## 2020-03-13 DIAGNOSIS — H02403 Unspecified ptosis of bilateral eyelids: Secondary | ICD-10-CM | POA: Diagnosis not present

## 2020-03-13 DIAGNOSIS — W908XXS Exposure to other nonionizing radiation, sequela: Secondary | ICD-10-CM | POA: Diagnosis not present

## 2020-03-13 DIAGNOSIS — M4807 Spinal stenosis, lumbosacral region: Secondary | ICD-10-CM | POA: Diagnosis not present

## 2020-03-13 DIAGNOSIS — H0011 Chalazion right upper eyelid: Secondary | ICD-10-CM | POA: Diagnosis not present

## 2020-03-13 DIAGNOSIS — H5231 Anisometropia: Secondary | ICD-10-CM | POA: Diagnosis not present

## 2020-03-13 DIAGNOSIS — R293 Abnormal posture: Secondary | ICD-10-CM | POA: Diagnosis not present

## 2020-03-13 DIAGNOSIS — I7300 Raynaud's syndrome without gangrene: Secondary | ICD-10-CM | POA: Diagnosis not present

## 2020-03-13 DIAGNOSIS — M5410 Radiculopathy, site unspecified: Secondary | ICD-10-CM | POA: Diagnosis not present

## 2020-03-13 DIAGNOSIS — N400 Enlarged prostate without lower urinary tract symptoms: Secondary | ICD-10-CM | POA: Diagnosis not present

## 2020-03-13 DIAGNOSIS — M7502 Adhesive capsulitis of left shoulder: Secondary | ICD-10-CM | POA: Diagnosis not present

## 2020-03-13 DIAGNOSIS — I2510 Atherosclerotic heart disease of native coronary artery without angina pectoris: Secondary | ICD-10-CM | POA: Diagnosis not present

## 2020-03-13 DIAGNOSIS — H0100A Unspecified blepharitis right eye, upper and lower eyelids: Secondary | ICD-10-CM | POA: Diagnosis not present

## 2020-03-13 DIAGNOSIS — F4310 Post-traumatic stress disorder, unspecified: Secondary | ICD-10-CM | POA: Diagnosis not present

## 2020-03-13 DIAGNOSIS — I771 Stricture of artery: Secondary | ICD-10-CM | POA: Diagnosis not present

## 2020-03-13 DIAGNOSIS — M5441 Lumbago with sciatica, right side: Secondary | ICD-10-CM | POA: Diagnosis not present

## 2020-03-13 DIAGNOSIS — T542X1A Toxic effect of corrosive acids and acid-like substances, accidental (unintentional), initial encounter: Secondary | ICD-10-CM | POA: Diagnosis not present

## 2020-03-13 DIAGNOSIS — M79651 Pain in right thigh: Secondary | ICD-10-CM | POA: Diagnosis not present

## 2020-03-13 DIAGNOSIS — N401 Enlarged prostate with lower urinary tract symptoms: Secondary | ICD-10-CM | POA: Diagnosis not present

## 2020-03-13 DIAGNOSIS — K0510 Chronic gingivitis, plaque induced: Secondary | ICD-10-CM | POA: Diagnosis not present

## 2020-03-13 DIAGNOSIS — R358 Other polyuria: Secondary | ICD-10-CM | POA: Diagnosis not present

## 2020-03-13 DIAGNOSIS — I87333 Chronic venous hypertension (idiopathic) with ulcer and inflammation of bilateral lower extremity: Secondary | ICD-10-CM | POA: Diagnosis not present

## 2020-03-13 DIAGNOSIS — E854 Organ-limited amyloidosis: Secondary | ICD-10-CM | POA: Diagnosis not present

## 2020-03-13 DIAGNOSIS — E209 Hypoparathyroidism, unspecified: Secondary | ICD-10-CM | POA: Diagnosis not present

## 2020-03-13 DIAGNOSIS — S8292XA Unspecified fracture of left lower leg, initial encounter for closed fracture: Secondary | ICD-10-CM | POA: Diagnosis not present

## 2020-03-13 DIAGNOSIS — K8020 Calculus of gallbladder without cholecystitis without obstruction: Secondary | ICD-10-CM | POA: Diagnosis not present

## 2020-03-13 DIAGNOSIS — G8929 Other chronic pain: Secondary | ICD-10-CM | POA: Diagnosis not present

## 2020-03-13 DIAGNOSIS — Z6823 Body mass index (BMI) 23.0-23.9, adult: Secondary | ICD-10-CM | POA: Diagnosis not present

## 2020-03-13 DIAGNOSIS — D2261 Melanocytic nevi of right upper limb, including shoulder: Secondary | ICD-10-CM | POA: Diagnosis not present

## 2020-03-13 DIAGNOSIS — R7309 Other abnormal glucose: Secondary | ICD-10-CM | POA: Diagnosis not present

## 2020-03-13 DIAGNOSIS — Z8616 Personal history of COVID-19: Secondary | ICD-10-CM | POA: Diagnosis not present

## 2020-03-13 DIAGNOSIS — J22 Unspecified acute lower respiratory infection: Secondary | ICD-10-CM | POA: Diagnosis not present

## 2020-03-13 DIAGNOSIS — R6520 Severe sepsis without septic shock: Secondary | ICD-10-CM | POA: Diagnosis not present

## 2020-03-13 DIAGNOSIS — I2583 Coronary atherosclerosis due to lipid rich plaque: Secondary | ICD-10-CM | POA: Diagnosis not present

## 2020-03-13 DIAGNOSIS — M5033 Other cervical disc degeneration, cervicothoracic region: Secondary | ICD-10-CM | POA: Diagnosis not present

## 2020-03-13 DIAGNOSIS — I9589 Other hypotension: Secondary | ICD-10-CM | POA: Diagnosis not present

## 2020-03-13 DIAGNOSIS — E11622 Type 2 diabetes mellitus with other skin ulcer: Secondary | ICD-10-CM | POA: Diagnosis not present

## 2020-03-13 DIAGNOSIS — F1120 Opioid dependence, uncomplicated: Secondary | ICD-10-CM | POA: Diagnosis not present

## 2020-03-13 DIAGNOSIS — L299 Pruritus, unspecified: Secondary | ICD-10-CM | POA: Diagnosis not present

## 2020-03-13 DIAGNOSIS — G301 Alzheimer's disease with late onset: Secondary | ICD-10-CM | POA: Diagnosis not present

## 2020-03-13 DIAGNOSIS — B182 Chronic viral hepatitis C: Secondary | ICD-10-CM | POA: Diagnosis not present

## 2020-03-13 DIAGNOSIS — Z955 Presence of coronary angioplasty implant and graft: Secondary | ICD-10-CM | POA: Diagnosis not present

## 2020-03-13 DIAGNOSIS — M19042 Primary osteoarthritis, left hand: Secondary | ICD-10-CM | POA: Diagnosis not present

## 2020-03-13 DIAGNOSIS — K644 Residual hemorrhoidal skin tags: Secondary | ICD-10-CM | POA: Diagnosis not present

## 2020-03-13 DIAGNOSIS — T63481D Toxic effect of venom of other arthropod, accidental (unintentional), subsequent encounter: Secondary | ICD-10-CM | POA: Diagnosis not present

## 2020-03-13 DIAGNOSIS — D509 Iron deficiency anemia, unspecified: Secondary | ICD-10-CM | POA: Diagnosis not present

## 2020-03-13 DIAGNOSIS — M79671 Pain in right foot: Secondary | ICD-10-CM | POA: Diagnosis not present

## 2020-03-13 DIAGNOSIS — H353221 Exudative age-related macular degeneration, left eye, with active choroidal neovascularization: Secondary | ICD-10-CM | POA: Diagnosis not present

## 2020-03-13 DIAGNOSIS — M79652 Pain in left thigh: Secondary | ICD-10-CM | POA: Diagnosis not present

## 2020-03-13 DIAGNOSIS — Z952 Presence of prosthetic heart valve: Secondary | ICD-10-CM | POA: Diagnosis not present

## 2020-03-13 DIAGNOSIS — Z6828 Body mass index (BMI) 28.0-28.9, adult: Secondary | ICD-10-CM | POA: Diagnosis not present

## 2020-03-13 DIAGNOSIS — S52022A Displaced fracture of olecranon process without intraarticular extension of left ulna, initial encounter for closed fracture: Secondary | ICD-10-CM | POA: Diagnosis not present

## 2020-03-13 DIAGNOSIS — M8580 Other specified disorders of bone density and structure, unspecified site: Secondary | ICD-10-CM | POA: Diagnosis not present

## 2020-03-13 DIAGNOSIS — Z20822 Contact with and (suspected) exposure to covid-19: Secondary | ICD-10-CM | POA: Diagnosis not present

## 2020-03-13 DIAGNOSIS — Z923 Personal history of irradiation: Secondary | ICD-10-CM | POA: Diagnosis not present

## 2020-03-13 DIAGNOSIS — Z86018 Personal history of other benign neoplasm: Secondary | ICD-10-CM | POA: Diagnosis not present

## 2020-03-13 DIAGNOSIS — H16143 Punctate keratitis, bilateral: Secondary | ICD-10-CM | POA: Diagnosis not present

## 2020-03-13 DIAGNOSIS — C44329 Squamous cell carcinoma of skin of other parts of face: Secondary | ICD-10-CM | POA: Diagnosis not present

## 2020-03-13 DIAGNOSIS — E1121 Type 2 diabetes mellitus with diabetic nephropathy: Secondary | ICD-10-CM | POA: Diagnosis not present

## 2020-03-13 DIAGNOSIS — Z96651 Presence of right artificial knee joint: Secondary | ICD-10-CM | POA: Diagnosis not present

## 2020-03-13 DIAGNOSIS — I130 Hypertensive heart and chronic kidney disease with heart failure and stage 1 through stage 4 chronic kidney disease, or unspecified chronic kidney disease: Secondary | ICD-10-CM | POA: Diagnosis not present

## 2020-03-13 DIAGNOSIS — M531 Cervicobrachial syndrome: Secondary | ICD-10-CM | POA: Diagnosis not present

## 2020-03-13 DIAGNOSIS — J4530 Mild persistent asthma, uncomplicated: Secondary | ICD-10-CM | POA: Diagnosis not present

## 2020-03-13 DIAGNOSIS — R627 Adult failure to thrive: Secondary | ICD-10-CM | POA: Diagnosis not present

## 2020-03-13 DIAGNOSIS — S22000A Wedge compression fracture of unspecified thoracic vertebra, initial encounter for closed fracture: Secondary | ICD-10-CM | POA: Diagnosis not present

## 2020-03-13 DIAGNOSIS — D649 Anemia, unspecified: Secondary | ICD-10-CM | POA: Diagnosis not present

## 2020-03-13 DIAGNOSIS — R140 Abdominal distension (gaseous): Secondary | ICD-10-CM | POA: Diagnosis not present

## 2020-03-13 DIAGNOSIS — Z90722 Acquired absence of ovaries, bilateral: Secondary | ICD-10-CM | POA: Diagnosis not present

## 2020-03-13 DIAGNOSIS — I4819 Other persistent atrial fibrillation: Secondary | ICD-10-CM | POA: Diagnosis not present

## 2020-03-13 DIAGNOSIS — H35372 Puckering of macula, left eye: Secondary | ICD-10-CM | POA: Diagnosis not present

## 2020-03-13 DIAGNOSIS — J45901 Unspecified asthma with (acute) exacerbation: Secondary | ICD-10-CM | POA: Diagnosis not present

## 2020-03-13 DIAGNOSIS — M4712 Other spondylosis with myelopathy, cervical region: Secondary | ICD-10-CM | POA: Diagnosis not present

## 2020-03-13 DIAGNOSIS — K2270 Barrett's esophagus without dysplasia: Secondary | ICD-10-CM | POA: Diagnosis not present

## 2020-03-13 DIAGNOSIS — C069 Malignant neoplasm of mouth, unspecified: Secondary | ICD-10-CM | POA: Diagnosis not present

## 2020-03-13 DIAGNOSIS — N186 End stage renal disease: Secondary | ICD-10-CM | POA: Diagnosis not present

## 2020-03-13 DIAGNOSIS — I5022 Chronic systolic (congestive) heart failure: Secondary | ICD-10-CM | POA: Diagnosis not present

## 2020-03-13 DIAGNOSIS — Z6841 Body Mass Index (BMI) 40.0 and over, adult: Secondary | ICD-10-CM | POA: Diagnosis not present

## 2020-03-13 DIAGNOSIS — M1712 Unilateral primary osteoarthritis, left knee: Secondary | ICD-10-CM | POA: Diagnosis not present

## 2020-03-13 DIAGNOSIS — K5790 Diverticulosis of intestine, part unspecified, without perforation or abscess without bleeding: Secondary | ICD-10-CM | POA: Diagnosis not present

## 2020-03-13 DIAGNOSIS — C155 Malignant neoplasm of lower third of esophagus: Secondary | ICD-10-CM | POA: Diagnosis not present

## 2020-03-13 DIAGNOSIS — M722 Plantar fascial fibromatosis: Secondary | ICD-10-CM | POA: Diagnosis not present

## 2020-03-13 DIAGNOSIS — R5383 Other fatigue: Secondary | ICD-10-CM | POA: Diagnosis not present

## 2020-03-13 DIAGNOSIS — M205X2 Other deformities of toe(s) (acquired), left foot: Secondary | ICD-10-CM | POA: Diagnosis not present

## 2020-03-13 DIAGNOSIS — M0630 Rheumatoid nodule, unspecified site: Secondary | ICD-10-CM | POA: Diagnosis not present

## 2020-03-13 DIAGNOSIS — E559 Vitamin D deficiency, unspecified: Secondary | ICD-10-CM | POA: Diagnosis not present

## 2020-03-13 DIAGNOSIS — F321 Major depressive disorder, single episode, moderate: Secondary | ICD-10-CM | POA: Diagnosis not present

## 2020-03-13 DIAGNOSIS — K5080 Crohn's disease of both small and large intestine without complications: Secondary | ICD-10-CM | POA: Diagnosis not present

## 2020-03-13 DIAGNOSIS — H02834 Dermatochalasis of left upper eyelid: Secondary | ICD-10-CM | POA: Diagnosis not present

## 2020-03-13 DIAGNOSIS — M5116 Intervertebral disc disorders with radiculopathy, lumbar region: Secondary | ICD-10-CM | POA: Diagnosis not present

## 2020-03-13 DIAGNOSIS — Z85828 Personal history of other malignant neoplasm of skin: Secondary | ICD-10-CM | POA: Diagnosis not present

## 2020-03-13 DIAGNOSIS — R1013 Epigastric pain: Secondary | ICD-10-CM | POA: Diagnosis not present

## 2020-03-13 DIAGNOSIS — M3312 Other dermatopolymyositis with myopathy: Secondary | ICD-10-CM | POA: Diagnosis not present

## 2020-03-13 DIAGNOSIS — C158 Malignant neoplasm of overlapping sites of esophagus: Secondary | ICD-10-CM | POA: Diagnosis not present

## 2020-03-13 DIAGNOSIS — D530 Protein deficiency anemia: Secondary | ICD-10-CM | POA: Diagnosis not present

## 2020-03-13 DIAGNOSIS — Z9882 Breast implant status: Secondary | ICD-10-CM | POA: Diagnosis not present

## 2020-03-13 DIAGNOSIS — Z8673 Personal history of transient ischemic attack (TIA), and cerebral infarction without residual deficits: Secondary | ICD-10-CM | POA: Diagnosis not present

## 2020-03-13 DIAGNOSIS — D045 Carcinoma in situ of skin of trunk: Secondary | ICD-10-CM | POA: Diagnosis not present

## 2020-03-13 DIAGNOSIS — J302 Other seasonal allergic rhinitis: Secondary | ICD-10-CM | POA: Diagnosis not present

## 2020-03-13 DIAGNOSIS — E7841 Elevated Lipoprotein(a): Secondary | ICD-10-CM | POA: Diagnosis not present

## 2020-03-13 DIAGNOSIS — J320 Chronic maxillary sinusitis: Secondary | ICD-10-CM | POA: Diagnosis not present

## 2020-03-13 DIAGNOSIS — H00022 Hordeolum internum right lower eyelid: Secondary | ICD-10-CM | POA: Diagnosis not present

## 2020-03-13 DIAGNOSIS — N3001 Acute cystitis with hematuria: Secondary | ICD-10-CM | POA: Diagnosis not present

## 2020-03-13 DIAGNOSIS — I63532 Cerebral infarction due to unspecified occlusion or stenosis of left posterior cerebral artery: Secondary | ICD-10-CM | POA: Diagnosis not present

## 2020-03-13 DIAGNOSIS — M4696 Unspecified inflammatory spondylopathy, lumbar region: Secondary | ICD-10-CM | POA: Diagnosis not present

## 2020-03-13 DIAGNOSIS — Z982 Presence of cerebrospinal fluid drainage device: Secondary | ICD-10-CM | POA: Diagnosis not present

## 2020-03-13 DIAGNOSIS — Z888 Allergy status to other drugs, medicaments and biological substances status: Secondary | ICD-10-CM | POA: Diagnosis not present

## 2020-03-13 DIAGNOSIS — R001 Bradycardia, unspecified: Secondary | ICD-10-CM | POA: Diagnosis not present

## 2020-03-13 DIAGNOSIS — M5416 Radiculopathy, lumbar region: Secondary | ICD-10-CM | POA: Diagnosis not present

## 2020-03-13 DIAGNOSIS — M79672 Pain in left foot: Secondary | ICD-10-CM | POA: Diagnosis not present

## 2020-03-13 DIAGNOSIS — M5010 Cervical disc disorder with radiculopathy, unspecified cervical region: Secondary | ICD-10-CM | POA: Diagnosis not present

## 2020-03-13 DIAGNOSIS — E1151 Type 2 diabetes mellitus with diabetic peripheral angiopathy without gangrene: Secondary | ICD-10-CM | POA: Diagnosis not present

## 2020-03-13 DIAGNOSIS — M50123 Cervical disc disorder at C6-C7 level with radiculopathy: Secondary | ICD-10-CM | POA: Diagnosis not present

## 2020-03-13 DIAGNOSIS — R609 Edema, unspecified: Secondary | ICD-10-CM | POA: Diagnosis not present

## 2020-03-13 DIAGNOSIS — E46 Unspecified protein-calorie malnutrition: Secondary | ICD-10-CM | POA: Diagnosis not present

## 2020-03-13 DIAGNOSIS — G4730 Sleep apnea, unspecified: Secondary | ICD-10-CM | POA: Diagnosis not present

## 2020-03-13 DIAGNOSIS — S8012XA Contusion of left lower leg, initial encounter: Secondary | ICD-10-CM | POA: Diagnosis not present

## 2020-03-13 DIAGNOSIS — I440 Atrioventricular block, first degree: Secondary | ICD-10-CM | POA: Diagnosis not present

## 2020-03-13 DIAGNOSIS — K589 Irritable bowel syndrome without diarrhea: Secondary | ICD-10-CM | POA: Diagnosis not present

## 2020-03-13 DIAGNOSIS — M25512 Pain in left shoulder: Secondary | ICD-10-CM | POA: Diagnosis not present

## 2020-03-13 DIAGNOSIS — M5030 Other cervical disc degeneration, unspecified cervical region: Secondary | ICD-10-CM | POA: Diagnosis not present

## 2020-03-13 DIAGNOSIS — Z9181 History of falling: Secondary | ICD-10-CM | POA: Diagnosis not present

## 2020-03-13 DIAGNOSIS — J329 Chronic sinusitis, unspecified: Secondary | ICD-10-CM | POA: Diagnosis not present

## 2020-03-13 DIAGNOSIS — M170 Bilateral primary osteoarthritis of knee: Secondary | ICD-10-CM | POA: Diagnosis not present

## 2020-03-13 DIAGNOSIS — C44320 Squamous cell carcinoma of skin of unspecified parts of face: Secondary | ICD-10-CM | POA: Diagnosis not present

## 2020-03-13 DIAGNOSIS — K743 Primary biliary cirrhosis: Secondary | ICD-10-CM | POA: Diagnosis not present

## 2020-03-13 DIAGNOSIS — C772 Secondary and unspecified malignant neoplasm of intra-abdominal lymph nodes: Secondary | ICD-10-CM | POA: Diagnosis not present

## 2020-03-13 DIAGNOSIS — Z6832 Body mass index (BMI) 32.0-32.9, adult: Secondary | ICD-10-CM | POA: Diagnosis not present

## 2020-03-13 DIAGNOSIS — H0100B Unspecified blepharitis left eye, upper and lower eyelids: Secondary | ICD-10-CM | POA: Diagnosis not present

## 2020-03-13 DIAGNOSIS — Z9989 Dependence on other enabling machines and devices: Secondary | ICD-10-CM | POA: Diagnosis not present

## 2020-03-13 DIAGNOSIS — Z1283 Encounter for screening for malignant neoplasm of skin: Secondary | ICD-10-CM | POA: Diagnosis not present

## 2020-03-13 DIAGNOSIS — B029 Zoster without complications: Secondary | ICD-10-CM | POA: Diagnosis not present

## 2020-03-13 DIAGNOSIS — R635 Abnormal weight gain: Secondary | ICD-10-CM | POA: Diagnosis not present

## 2020-03-13 DIAGNOSIS — W050XXA Fall from non-moving wheelchair, initial encounter: Secondary | ICD-10-CM | POA: Diagnosis not present

## 2020-03-13 DIAGNOSIS — W19XXXD Unspecified fall, subsequent encounter: Secondary | ICD-10-CM | POA: Diagnosis not present

## 2020-03-13 DIAGNOSIS — M21061 Valgus deformity, not elsewhere classified, right knee: Secondary | ICD-10-CM | POA: Diagnosis not present

## 2020-03-13 DIAGNOSIS — M7551 Bursitis of right shoulder: Secondary | ICD-10-CM | POA: Diagnosis not present

## 2020-03-13 DIAGNOSIS — G459 Transient cerebral ischemic attack, unspecified: Secondary | ICD-10-CM | POA: Diagnosis not present

## 2020-03-13 DIAGNOSIS — L814 Other melanin hyperpigmentation: Secondary | ICD-10-CM | POA: Diagnosis not present

## 2020-03-13 DIAGNOSIS — C44319 Basal cell carcinoma of skin of other parts of face: Secondary | ICD-10-CM | POA: Diagnosis not present

## 2020-03-13 DIAGNOSIS — F900 Attention-deficit hyperactivity disorder, predominantly inattentive type: Secondary | ICD-10-CM | POA: Diagnosis not present

## 2020-03-13 DIAGNOSIS — L281 Prurigo nodularis: Secondary | ICD-10-CM | POA: Diagnosis not present

## 2020-03-13 DIAGNOSIS — M810 Age-related osteoporosis without current pathological fracture: Secondary | ICD-10-CM | POA: Diagnosis not present

## 2020-03-13 DIAGNOSIS — Z1159 Encounter for screening for other viral diseases: Secondary | ICD-10-CM | POA: Diagnosis not present

## 2020-03-13 DIAGNOSIS — L905 Scar conditions and fibrosis of skin: Secondary | ICD-10-CM | POA: Diagnosis not present

## 2020-03-13 DIAGNOSIS — M3500 Sicca syndrome, unspecified: Secondary | ICD-10-CM | POA: Diagnosis not present

## 2020-03-13 DIAGNOSIS — F1020 Alcohol dependence, uncomplicated: Secondary | ICD-10-CM | POA: Diagnosis not present

## 2020-03-13 DIAGNOSIS — M4327 Fusion of spine, lumbosacral region: Secondary | ICD-10-CM | POA: Diagnosis not present

## 2020-03-13 DIAGNOSIS — M25572 Pain in left ankle and joints of left foot: Secondary | ICD-10-CM | POA: Diagnosis not present

## 2020-03-13 DIAGNOSIS — F331 Major depressive disorder, recurrent, moderate: Secondary | ICD-10-CM | POA: Diagnosis not present

## 2020-03-13 DIAGNOSIS — H35371 Puckering of macula, right eye: Secondary | ICD-10-CM | POA: Diagnosis not present

## 2020-03-13 DIAGNOSIS — H0288B Meibomian gland dysfunction left eye, upper and lower eyelids: Secondary | ICD-10-CM | POA: Diagnosis not present

## 2020-03-13 DIAGNOSIS — H02831 Dermatochalasis of right upper eyelid: Secondary | ICD-10-CM | POA: Diagnosis not present

## 2020-03-13 DIAGNOSIS — Z9189 Other specified personal risk factors, not elsewhere classified: Secondary | ICD-10-CM | POA: Diagnosis not present

## 2020-03-13 DIAGNOSIS — Z1329 Encounter for screening for other suspected endocrine disorder: Secondary | ICD-10-CM | POA: Diagnosis not present

## 2020-03-13 DIAGNOSIS — Z1331 Encounter for screening for depression: Secondary | ICD-10-CM | POA: Diagnosis not present

## 2020-03-13 DIAGNOSIS — M25552 Pain in left hip: Secondary | ICD-10-CM | POA: Diagnosis not present

## 2020-03-13 DIAGNOSIS — I700 Atherosclerosis of aorta: Secondary | ICD-10-CM | POA: Diagnosis not present

## 2020-03-13 DIAGNOSIS — M669 Spontaneous rupture of unspecified tendon: Secondary | ICD-10-CM | POA: Diagnosis not present

## 2020-03-13 DIAGNOSIS — H43393 Other vitreous opacities, bilateral: Secondary | ICD-10-CM | POA: Diagnosis not present

## 2020-03-13 DIAGNOSIS — Z681 Body mass index (BMI) 19 or less, adult: Secondary | ICD-10-CM | POA: Diagnosis not present

## 2020-03-13 DIAGNOSIS — Z85820 Personal history of malignant melanoma of skin: Secondary | ICD-10-CM | POA: Diagnosis not present

## 2020-03-13 DIAGNOSIS — I9789 Other postprocedural complications and disorders of the circulatory system, not elsewhere classified: Secondary | ICD-10-CM | POA: Diagnosis not present

## 2020-03-13 DIAGNOSIS — G8220 Paraplegia, unspecified: Secondary | ICD-10-CM | POA: Diagnosis not present

## 2020-03-13 DIAGNOSIS — Z951 Presence of aortocoronary bypass graft: Secondary | ICD-10-CM | POA: Diagnosis not present

## 2020-03-13 DIAGNOSIS — Z8639 Personal history of other endocrine, nutritional and metabolic disease: Secondary | ICD-10-CM | POA: Diagnosis not present

## 2020-03-13 DIAGNOSIS — I484 Atypical atrial flutter: Secondary | ICD-10-CM | POA: Diagnosis not present

## 2020-03-13 DIAGNOSIS — Z833 Family history of diabetes mellitus: Secondary | ICD-10-CM | POA: Diagnosis not present

## 2020-03-13 DIAGNOSIS — M66862 Spontaneous rupture of other tendons, left lower leg: Secondary | ICD-10-CM | POA: Diagnosis not present

## 2020-03-13 DIAGNOSIS — C672 Malignant neoplasm of lateral wall of bladder: Secondary | ICD-10-CM | POA: Diagnosis not present

## 2020-03-13 DIAGNOSIS — H6063 Unspecified chronic otitis externa, bilateral: Secondary | ICD-10-CM | POA: Diagnosis not present

## 2020-03-13 DIAGNOSIS — L600 Ingrowing nail: Secondary | ICD-10-CM | POA: Diagnosis not present

## 2020-03-13 DIAGNOSIS — H59021 Cataract (lens) fragments in eye following cataract surgery, right eye: Secondary | ICD-10-CM | POA: Diagnosis not present

## 2020-03-13 DIAGNOSIS — M50323 Other cervical disc degeneration at C6-C7 level: Secondary | ICD-10-CM | POA: Diagnosis not present

## 2020-03-13 DIAGNOSIS — C50812 Malignant neoplasm of overlapping sites of left female breast: Secondary | ICD-10-CM | POA: Diagnosis not present

## 2020-03-13 DIAGNOSIS — K209 Esophagitis, unspecified without bleeding: Secondary | ICD-10-CM | POA: Diagnosis not present

## 2020-03-13 DIAGNOSIS — J310 Chronic rhinitis: Secondary | ICD-10-CM | POA: Diagnosis not present

## 2020-03-13 DIAGNOSIS — Z20828 Contact with and (suspected) exposure to other viral communicable diseases: Secondary | ICD-10-CM | POA: Diagnosis not present

## 2020-03-13 DIAGNOSIS — I87393 Chronic venous hypertension (idiopathic) with other complications of bilateral lower extremity: Secondary | ICD-10-CM | POA: Diagnosis not present

## 2020-03-13 DIAGNOSIS — L03116 Cellulitis of left lower limb: Secondary | ICD-10-CM | POA: Diagnosis not present

## 2020-03-13 DIAGNOSIS — I5023 Acute on chronic systolic (congestive) heart failure: Secondary | ICD-10-CM | POA: Diagnosis not present

## 2020-03-13 DIAGNOSIS — G43819 Other migraine, intractable, without status migrainosus: Secondary | ICD-10-CM | POA: Diagnosis not present

## 2020-03-13 DIAGNOSIS — E7800 Pure hypercholesterolemia, unspecified: Secondary | ICD-10-CM | POA: Diagnosis not present

## 2020-03-13 DIAGNOSIS — J018 Other acute sinusitis: Secondary | ICD-10-CM | POA: Diagnosis not present

## 2020-03-13 DIAGNOSIS — S0512XD Contusion of eyeball and orbital tissues, left eye, subsequent encounter: Secondary | ICD-10-CM | POA: Diagnosis not present

## 2020-03-13 DIAGNOSIS — Q6102 Congenital multiple renal cysts: Secondary | ICD-10-CM | POA: Diagnosis not present

## 2020-03-13 DIAGNOSIS — D1801 Hemangioma of skin and subcutaneous tissue: Secondary | ICD-10-CM | POA: Diagnosis not present

## 2020-03-13 DIAGNOSIS — N200 Calculus of kidney: Secondary | ICD-10-CM | POA: Diagnosis not present

## 2020-03-13 DIAGNOSIS — Z86711 Personal history of pulmonary embolism: Secondary | ICD-10-CM | POA: Diagnosis not present

## 2020-03-13 DIAGNOSIS — M0609 Rheumatoid arthritis without rheumatoid factor, multiple sites: Secondary | ICD-10-CM | POA: Diagnosis not present

## 2020-03-13 DIAGNOSIS — M7752 Other enthesopathy of left foot: Secondary | ICD-10-CM | POA: Diagnosis not present

## 2020-03-13 DIAGNOSIS — E872 Acidosis: Secondary | ICD-10-CM | POA: Diagnosis not present

## 2020-03-13 DIAGNOSIS — H5201 Hypermetropia, right eye: Secondary | ICD-10-CM | POA: Diagnosis not present

## 2020-03-13 DIAGNOSIS — H02833 Dermatochalasis of right eye, unspecified eyelid: Secondary | ICD-10-CM | POA: Diagnosis not present

## 2020-03-13 DIAGNOSIS — I639 Cerebral infarction, unspecified: Secondary | ICD-10-CM | POA: Diagnosis not present

## 2020-03-13 DIAGNOSIS — D72825 Bandemia: Secondary | ICD-10-CM | POA: Diagnosis not present

## 2020-03-13 DIAGNOSIS — R338 Other retention of urine: Secondary | ICD-10-CM | POA: Diagnosis not present

## 2020-03-13 DIAGNOSIS — K567 Ileus, unspecified: Secondary | ICD-10-CM | POA: Diagnosis not present

## 2020-03-13 DIAGNOSIS — E876 Hypokalemia: Secondary | ICD-10-CM | POA: Diagnosis not present

## 2020-03-13 DIAGNOSIS — I959 Hypotension, unspecified: Secondary | ICD-10-CM | POA: Diagnosis not present

## 2020-03-13 DIAGNOSIS — M4810 Ankylosing hyperostosis [Forestier], site unspecified: Secondary | ICD-10-CM | POA: Diagnosis not present

## 2020-03-13 DIAGNOSIS — H16211 Exposure keratoconjunctivitis, right eye: Secondary | ICD-10-CM | POA: Diagnosis not present

## 2020-03-13 DIAGNOSIS — C61 Malignant neoplasm of prostate: Secondary | ICD-10-CM | POA: Diagnosis not present

## 2020-03-13 DIAGNOSIS — M2142 Flat foot [pes planus] (acquired), left foot: Secondary | ICD-10-CM | POA: Diagnosis not present

## 2020-03-13 DIAGNOSIS — F330 Major depressive disorder, recurrent, mild: Secondary | ICD-10-CM | POA: Diagnosis not present

## 2020-03-13 DIAGNOSIS — J343 Hypertrophy of nasal turbinates: Secondary | ICD-10-CM | POA: Diagnosis not present

## 2020-03-13 DIAGNOSIS — G63 Polyneuropathy in diseases classified elsewhere: Secondary | ICD-10-CM | POA: Diagnosis not present

## 2020-03-13 DIAGNOSIS — D708 Other neutropenia: Secondary | ICD-10-CM | POA: Diagnosis not present

## 2020-03-13 DIAGNOSIS — J3089 Other allergic rhinitis: Secondary | ICD-10-CM | POA: Diagnosis not present

## 2020-03-13 DIAGNOSIS — G2581 Restless legs syndrome: Secondary | ICD-10-CM | POA: Diagnosis not present

## 2020-03-13 DIAGNOSIS — Z119 Encounter for screening for infectious and parasitic diseases, unspecified: Secondary | ICD-10-CM | POA: Diagnosis not present

## 2020-03-13 DIAGNOSIS — H43311 Vitreous membranes and strands, right eye: Secondary | ICD-10-CM | POA: Diagnosis not present

## 2020-03-13 DIAGNOSIS — R1031 Right lower quadrant pain: Secondary | ICD-10-CM | POA: Diagnosis not present

## 2020-03-13 DIAGNOSIS — G620 Drug-induced polyneuropathy: Secondary | ICD-10-CM | POA: Diagnosis not present

## 2020-03-13 DIAGNOSIS — M546 Pain in thoracic spine: Secondary | ICD-10-CM | POA: Diagnosis not present

## 2020-03-13 DIAGNOSIS — N6022 Fibroadenosis of left breast: Secondary | ICD-10-CM | POA: Diagnosis not present

## 2020-03-13 DIAGNOSIS — N958 Other specified menopausal and perimenopausal disorders: Secondary | ICD-10-CM | POA: Diagnosis not present

## 2020-03-13 DIAGNOSIS — Z7189 Other specified counseling: Secondary | ICD-10-CM | POA: Diagnosis not present

## 2020-03-13 DIAGNOSIS — G4733 Obstructive sleep apnea (adult) (pediatric): Secondary | ICD-10-CM | POA: Diagnosis not present

## 2020-03-13 DIAGNOSIS — N289 Disorder of kidney and ureter, unspecified: Secondary | ICD-10-CM | POA: Diagnosis not present

## 2020-03-13 DIAGNOSIS — J45909 Unspecified asthma, uncomplicated: Secondary | ICD-10-CM | POA: Diagnosis not present

## 2020-03-13 DIAGNOSIS — Z8541 Personal history of malignant neoplasm of cervix uteri: Secondary | ICD-10-CM | POA: Diagnosis not present

## 2020-03-13 DIAGNOSIS — M25611 Stiffness of right shoulder, not elsewhere classified: Secondary | ICD-10-CM | POA: Diagnosis not present

## 2020-03-13 DIAGNOSIS — M25532 Pain in left wrist: Secondary | ICD-10-CM | POA: Diagnosis not present

## 2020-03-13 DIAGNOSIS — R300 Dysuria: Secondary | ICD-10-CM | POA: Diagnosis not present

## 2020-03-13 DIAGNOSIS — I6529 Occlusion and stenosis of unspecified carotid artery: Secondary | ICD-10-CM | POA: Diagnosis not present

## 2020-03-13 DIAGNOSIS — A419 Sepsis, unspecified organism: Secondary | ICD-10-CM | POA: Diagnosis not present

## 2020-03-13 DIAGNOSIS — S20469A Insect bite (nonvenomous) of unspecified back wall of thorax, initial encounter: Secondary | ICD-10-CM | POA: Diagnosis not present

## 2020-03-13 DIAGNOSIS — J301 Allergic rhinitis due to pollen: Secondary | ICD-10-CM | POA: Diagnosis not present

## 2020-03-13 DIAGNOSIS — F411 Generalized anxiety disorder: Secondary | ICD-10-CM | POA: Diagnosis not present

## 2020-03-13 DIAGNOSIS — M62561 Muscle wasting and atrophy, not elsewhere classified, right lower leg: Secondary | ICD-10-CM | POA: Diagnosis not present

## 2020-03-13 DIAGNOSIS — N453 Epididymo-orchitis: Secondary | ICD-10-CM | POA: Diagnosis not present

## 2020-03-13 DIAGNOSIS — G35 Multiple sclerosis: Secondary | ICD-10-CM | POA: Diagnosis not present

## 2020-03-13 DIAGNOSIS — M4120 Other idiopathic scoliosis, site unspecified: Secondary | ICD-10-CM | POA: Diagnosis not present

## 2020-03-13 DIAGNOSIS — M1A00X1 Idiopathic chronic gout, unspecified site, with tophus (tophi): Secondary | ICD-10-CM | POA: Diagnosis not present

## 2020-03-13 DIAGNOSIS — E1022 Type 1 diabetes mellitus with diabetic chronic kidney disease: Secondary | ICD-10-CM | POA: Diagnosis not present

## 2020-03-13 DIAGNOSIS — R3915 Urgency of urination: Secondary | ICD-10-CM | POA: Diagnosis not present

## 2020-03-13 DIAGNOSIS — J439 Emphysema, unspecified: Secondary | ICD-10-CM | POA: Diagnosis not present

## 2020-03-13 DIAGNOSIS — J441 Chronic obstructive pulmonary disease with (acute) exacerbation: Secondary | ICD-10-CM | POA: Diagnosis not present

## 2020-03-13 DIAGNOSIS — M2041 Other hammer toe(s) (acquired), right foot: Secondary | ICD-10-CM | POA: Diagnosis not present

## 2020-03-13 DIAGNOSIS — Z5112 Encounter for antineoplastic immunotherapy: Secondary | ICD-10-CM | POA: Diagnosis not present

## 2020-03-13 DIAGNOSIS — Z01818 Encounter for other preprocedural examination: Secondary | ICD-10-CM | POA: Diagnosis not present

## 2020-03-13 DIAGNOSIS — K2970 Gastritis, unspecified, without bleeding: Secondary | ICD-10-CM | POA: Diagnosis not present

## 2020-03-13 DIAGNOSIS — D171 Benign lipomatous neoplasm of skin and subcutaneous tissue of trunk: Secondary | ICD-10-CM | POA: Diagnosis not present

## 2020-03-13 DIAGNOSIS — N63 Unspecified lump in unspecified breast: Secondary | ICD-10-CM | POA: Diagnosis not present

## 2020-03-13 DIAGNOSIS — M1A0720 Idiopathic chronic gout, left ankle and foot, without tophus (tophi): Secondary | ICD-10-CM | POA: Diagnosis not present

## 2020-03-13 DIAGNOSIS — K7031 Alcoholic cirrhosis of liver with ascites: Secondary | ICD-10-CM | POA: Diagnosis not present

## 2020-03-13 DIAGNOSIS — Z1231 Encounter for screening mammogram for malignant neoplasm of breast: Secondary | ICD-10-CM | POA: Diagnosis not present

## 2020-03-13 DIAGNOSIS — M2560 Stiffness of unspecified joint, not elsewhere classified: Secondary | ICD-10-CM | POA: Diagnosis not present

## 2020-03-13 DIAGNOSIS — R2241 Localized swelling, mass and lump, right lower limb: Secondary | ICD-10-CM | POA: Diagnosis not present

## 2020-03-13 DIAGNOSIS — C9001 Multiple myeloma in remission: Secondary | ICD-10-CM | POA: Diagnosis not present

## 2020-03-13 DIAGNOSIS — R161 Splenomegaly, not elsewhere classified: Secondary | ICD-10-CM | POA: Diagnosis not present

## 2020-03-13 DIAGNOSIS — M25571 Pain in right ankle and joints of right foot: Secondary | ICD-10-CM | POA: Diagnosis not present

## 2020-03-13 DIAGNOSIS — M85851 Other specified disorders of bone density and structure, right thigh: Secondary | ICD-10-CM | POA: Diagnosis not present

## 2020-03-13 DIAGNOSIS — R062 Wheezing: Secondary | ICD-10-CM | POA: Diagnosis not present

## 2020-03-13 DIAGNOSIS — C8300 Small cell B-cell lymphoma, unspecified site: Secondary | ICD-10-CM | POA: Diagnosis not present

## 2020-03-13 DIAGNOSIS — R600 Localized edema: Secondary | ICD-10-CM | POA: Diagnosis not present

## 2020-03-13 DIAGNOSIS — N179 Acute kidney failure, unspecified: Secondary | ICD-10-CM | POA: Diagnosis not present

## 2020-03-13 DIAGNOSIS — H353211 Exudative age-related macular degeneration, right eye, with active choroidal neovascularization: Secondary | ICD-10-CM | POA: Diagnosis not present

## 2020-03-13 DIAGNOSIS — Z03818 Encounter for observation for suspected exposure to other biological agents ruled out: Secondary | ICD-10-CM | POA: Diagnosis not present

## 2020-03-13 DIAGNOSIS — Z125 Encounter for screening for malignant neoplasm of prostate: Secondary | ICD-10-CM | POA: Diagnosis not present

## 2020-03-13 DIAGNOSIS — F339 Major depressive disorder, recurrent, unspecified: Secondary | ICD-10-CM | POA: Diagnosis not present

## 2020-03-13 DIAGNOSIS — J029 Acute pharyngitis, unspecified: Secondary | ICD-10-CM | POA: Diagnosis not present

## 2020-03-13 DIAGNOSIS — Z139 Encounter for screening, unspecified: Secondary | ICD-10-CM | POA: Diagnosis not present

## 2020-03-13 DIAGNOSIS — R519 Headache, unspecified: Secondary | ICD-10-CM | POA: Diagnosis not present

## 2020-03-13 DIAGNOSIS — H5213 Myopia, bilateral: Secondary | ICD-10-CM | POA: Diagnosis not present

## 2020-03-13 DIAGNOSIS — H34812 Central retinal vein occlusion, left eye, with macular edema: Secondary | ICD-10-CM | POA: Diagnosis not present

## 2020-03-13 DIAGNOSIS — M4802 Spinal stenosis, cervical region: Secondary | ICD-10-CM | POA: Diagnosis not present

## 2020-03-13 DIAGNOSIS — N183 Chronic kidney disease, stage 3 unspecified: Secondary | ICD-10-CM | POA: Diagnosis not present

## 2020-03-13 DIAGNOSIS — M818 Other osteoporosis without current pathological fracture: Secondary | ICD-10-CM | POA: Diagnosis not present

## 2020-03-13 DIAGNOSIS — M2011 Hallux valgus (acquired), right foot: Secondary | ICD-10-CM | POA: Diagnosis not present

## 2020-03-13 DIAGNOSIS — L989 Disorder of the skin and subcutaneous tissue, unspecified: Secondary | ICD-10-CM | POA: Diagnosis not present

## 2020-03-13 DIAGNOSIS — M25612 Stiffness of left shoulder, not elsewhere classified: Secondary | ICD-10-CM | POA: Diagnosis not present

## 2020-03-13 DIAGNOSIS — M47816 Spondylosis without myelopathy or radiculopathy, lumbar region: Secondary | ICD-10-CM | POA: Diagnosis not present

## 2020-03-13 DIAGNOSIS — Z791 Long term (current) use of non-steroidal anti-inflammatories (NSAID): Secondary | ICD-10-CM | POA: Diagnosis not present

## 2020-03-13 DIAGNOSIS — R1084 Generalized abdominal pain: Secondary | ICD-10-CM | POA: Diagnosis not present

## 2020-03-13 DIAGNOSIS — L858 Other specified epidermal thickening: Secondary | ICD-10-CM | POA: Diagnosis not present

## 2020-03-13 DIAGNOSIS — M62551 Muscle wasting and atrophy, not elsewhere classified, right thigh: Secondary | ICD-10-CM | POA: Diagnosis not present

## 2020-03-13 DIAGNOSIS — H1789 Other corneal scars and opacities: Secondary | ICD-10-CM | POA: Diagnosis not present

## 2020-03-13 DIAGNOSIS — K224 Dyskinesia of esophagus: Secondary | ICD-10-CM | POA: Diagnosis not present

## 2020-03-13 DIAGNOSIS — E11621 Type 2 diabetes mellitus with foot ulcer: Secondary | ICD-10-CM | POA: Diagnosis not present

## 2020-03-13 DIAGNOSIS — L538 Other specified erythematous conditions: Secondary | ICD-10-CM | POA: Diagnosis not present

## 2020-03-13 DIAGNOSIS — R339 Retention of urine, unspecified: Secondary | ICD-10-CM | POA: Diagnosis not present

## 2020-03-13 DIAGNOSIS — R739 Hyperglycemia, unspecified: Secondary | ICD-10-CM | POA: Diagnosis not present

## 2020-03-13 DIAGNOSIS — S36892A Contusion of other intra-abdominal organs, initial encounter: Secondary | ICD-10-CM | POA: Diagnosis not present

## 2020-03-13 DIAGNOSIS — M79676 Pain in unspecified toe(s): Secondary | ICD-10-CM | POA: Diagnosis not present

## 2020-03-13 DIAGNOSIS — E875 Hyperkalemia: Secondary | ICD-10-CM | POA: Diagnosis not present

## 2020-03-13 DIAGNOSIS — M47814 Spondylosis without myelopathy or radiculopathy, thoracic region: Secondary | ICD-10-CM | POA: Diagnosis not present

## 2020-03-13 DIAGNOSIS — D2271 Melanocytic nevi of right lower limb, including hip: Secondary | ICD-10-CM | POA: Diagnosis not present

## 2020-03-13 DIAGNOSIS — F17200 Nicotine dependence, unspecified, uncomplicated: Secondary | ICD-10-CM | POA: Diagnosis not present

## 2020-03-13 DIAGNOSIS — N6320 Unspecified lump in the left breast, unspecified quadrant: Secondary | ICD-10-CM | POA: Diagnosis not present

## 2020-03-13 DIAGNOSIS — S63391A Traumatic rupture of other ligament of right wrist, initial encounter: Secondary | ICD-10-CM | POA: Diagnosis not present

## 2020-03-13 DIAGNOSIS — M0600 Rheumatoid arthritis without rheumatoid factor, unspecified site: Secondary | ICD-10-CM | POA: Diagnosis not present

## 2020-03-13 DIAGNOSIS — M62838 Other muscle spasm: Secondary | ICD-10-CM | POA: Diagnosis not present

## 2020-03-13 DIAGNOSIS — J1282 Pneumonia due to coronavirus disease 2019: Secondary | ICD-10-CM | POA: Diagnosis not present

## 2020-03-13 DIAGNOSIS — H1033 Unspecified acute conjunctivitis, bilateral: Secondary | ICD-10-CM | POA: Diagnosis not present

## 2020-03-13 DIAGNOSIS — Z1152 Encounter for screening for COVID-19: Secondary | ICD-10-CM | POA: Diagnosis not present

## 2020-03-13 DIAGNOSIS — F3289 Other specified depressive episodes: Secondary | ICD-10-CM | POA: Diagnosis not present

## 2020-03-13 DIAGNOSIS — M791 Myalgia, unspecified site: Secondary | ICD-10-CM | POA: Diagnosis not present

## 2020-03-13 DIAGNOSIS — D0511 Intraductal carcinoma in situ of right breast: Secondary | ICD-10-CM | POA: Diagnosis not present

## 2020-03-13 DIAGNOSIS — M79606 Pain in leg, unspecified: Secondary | ICD-10-CM | POA: Diagnosis not present

## 2020-03-13 DIAGNOSIS — H353132 Nonexudative age-related macular degeneration, bilateral, intermediate dry stage: Secondary | ICD-10-CM | POA: Diagnosis not present

## 2020-03-13 DIAGNOSIS — H35362 Drusen (degenerative) of macula, left eye: Secondary | ICD-10-CM | POA: Diagnosis not present

## 2020-03-13 DIAGNOSIS — M79605 Pain in left leg: Secondary | ICD-10-CM | POA: Diagnosis not present

## 2020-03-13 DIAGNOSIS — M25531 Pain in right wrist: Secondary | ICD-10-CM | POA: Diagnosis not present

## 2020-03-13 DIAGNOSIS — D0439 Carcinoma in situ of skin of other parts of face: Secondary | ICD-10-CM | POA: Diagnosis not present

## 2020-03-13 DIAGNOSIS — J410 Simple chronic bronchitis: Secondary | ICD-10-CM | POA: Diagnosis not present

## 2020-03-13 DIAGNOSIS — I70219 Atherosclerosis of native arteries of extremities with intermittent claudication, unspecified extremity: Secondary | ICD-10-CM | POA: Diagnosis not present

## 2020-03-13 DIAGNOSIS — I8500 Esophageal varices without bleeding: Secondary | ICD-10-CM | POA: Diagnosis not present

## 2020-03-13 DIAGNOSIS — Z950 Presence of cardiac pacemaker: Secondary | ICD-10-CM | POA: Diagnosis not present

## 2020-03-13 DIAGNOSIS — M5412 Radiculopathy, cervical region: Secondary | ICD-10-CM | POA: Diagnosis not present

## 2020-03-13 DIAGNOSIS — C678 Malignant neoplasm of overlapping sites of bladder: Secondary | ICD-10-CM | POA: Diagnosis not present

## 2020-03-13 DIAGNOSIS — R410 Disorientation, unspecified: Secondary | ICD-10-CM | POA: Diagnosis not present

## 2020-03-13 DIAGNOSIS — D090 Carcinoma in situ of bladder: Secondary | ICD-10-CM | POA: Diagnosis not present

## 2020-03-13 DIAGNOSIS — R634 Abnormal weight loss: Secondary | ICD-10-CM | POA: Diagnosis not present

## 2020-03-13 DIAGNOSIS — R2681 Unsteadiness on feet: Secondary | ICD-10-CM | POA: Diagnosis not present

## 2020-03-13 DIAGNOSIS — W57XXXA Bitten or stung by nonvenomous insect and other nonvenomous arthropods, initial encounter: Secondary | ICD-10-CM | POA: Diagnosis not present

## 2020-03-13 DIAGNOSIS — E1165 Type 2 diabetes mellitus with hyperglycemia: Secondary | ICD-10-CM | POA: Diagnosis not present

## 2020-03-13 DIAGNOSIS — H9042 Sensorineural hearing loss, unilateral, left ear, with unrestricted hearing on the contralateral side: Secondary | ICD-10-CM | POA: Diagnosis not present

## 2020-03-13 DIAGNOSIS — S32411S Displaced fracture of anterior wall of right acetabulum, sequela: Secondary | ICD-10-CM | POA: Diagnosis not present

## 2020-03-13 DIAGNOSIS — L812 Freckles: Secondary | ICD-10-CM | POA: Diagnosis not present

## 2020-03-13 DIAGNOSIS — Z8669 Personal history of other diseases of the nervous system and sense organs: Secondary | ICD-10-CM | POA: Diagnosis not present

## 2020-03-13 DIAGNOSIS — D2372 Other benign neoplasm of skin of left lower limb, including hip: Secondary | ICD-10-CM | POA: Diagnosis not present

## 2020-03-13 DIAGNOSIS — I8311 Varicose veins of right lower extremity with inflammation: Secondary | ICD-10-CM | POA: Diagnosis not present

## 2020-03-13 DIAGNOSIS — F0280 Dementia in other diseases classified elsewhere without behavioral disturbance: Secondary | ICD-10-CM | POA: Diagnosis not present

## 2020-03-13 DIAGNOSIS — N319 Neuromuscular dysfunction of bladder, unspecified: Secondary | ICD-10-CM | POA: Diagnosis not present

## 2020-03-13 DIAGNOSIS — Z8349 Family history of other endocrine, nutritional and metabolic diseases: Secondary | ICD-10-CM | POA: Diagnosis not present

## 2020-03-13 DIAGNOSIS — H35443 Age-related reticular degeneration of retina, bilateral: Secondary | ICD-10-CM | POA: Diagnosis not present

## 2020-03-13 DIAGNOSIS — Z9013 Acquired absence of bilateral breasts and nipples: Secondary | ICD-10-CM | POA: Diagnosis not present

## 2020-03-13 DIAGNOSIS — R208 Other disturbances of skin sensation: Secondary | ICD-10-CM | POA: Diagnosis not present

## 2020-03-13 DIAGNOSIS — J019 Acute sinusitis, unspecified: Secondary | ICD-10-CM | POA: Diagnosis not present

## 2020-03-13 DIAGNOSIS — I340 Nonrheumatic mitral (valve) insufficiency: Secondary | ICD-10-CM | POA: Diagnosis not present

## 2020-03-13 DIAGNOSIS — Z79899 Other long term (current) drug therapy: Secondary | ICD-10-CM | POA: Diagnosis not present

## 2020-03-13 DIAGNOSIS — S2241XD Multiple fractures of ribs, right side, subsequent encounter for fracture with routine healing: Secondary | ICD-10-CM | POA: Diagnosis not present

## 2020-03-13 DIAGNOSIS — D6862 Lupus anticoagulant syndrome: Secondary | ICD-10-CM | POA: Diagnosis not present

## 2020-03-13 DIAGNOSIS — M542 Cervicalgia: Secondary | ICD-10-CM | POA: Diagnosis not present

## 2020-03-13 DIAGNOSIS — M25521 Pain in right elbow: Secondary | ICD-10-CM | POA: Diagnosis not present

## 2020-03-13 DIAGNOSIS — D6869 Other thrombophilia: Secondary | ICD-10-CM | POA: Diagnosis not present

## 2020-03-13 DIAGNOSIS — R7401 Elevation of levels of liver transaminase levels: Secondary | ICD-10-CM | POA: Diagnosis not present

## 2020-03-13 DIAGNOSIS — E1140 Type 2 diabetes mellitus with diabetic neuropathy, unspecified: Secondary | ICD-10-CM | POA: Diagnosis not present

## 2020-03-13 DIAGNOSIS — H26493 Other secondary cataract, bilateral: Secondary | ICD-10-CM | POA: Diagnosis not present

## 2020-03-13 DIAGNOSIS — H33322 Round hole, left eye: Secondary | ICD-10-CM | POA: Diagnosis not present

## 2020-03-13 DIAGNOSIS — K922 Gastrointestinal hemorrhage, unspecified: Secondary | ICD-10-CM | POA: Diagnosis not present

## 2020-03-13 DIAGNOSIS — R1032 Left lower quadrant pain: Secondary | ICD-10-CM | POA: Diagnosis not present

## 2020-03-13 DIAGNOSIS — T451X5A Adverse effect of antineoplastic and immunosuppressive drugs, initial encounter: Secondary | ICD-10-CM | POA: Diagnosis not present

## 2020-03-13 DIAGNOSIS — S43085D Other dislocation of left shoulder joint, subsequent encounter: Secondary | ICD-10-CM | POA: Diagnosis not present

## 2020-03-13 DIAGNOSIS — R569 Unspecified convulsions: Secondary | ICD-10-CM | POA: Diagnosis not present

## 2020-03-13 DIAGNOSIS — I059 Rheumatic mitral valve disease, unspecified: Secondary | ICD-10-CM | POA: Diagnosis not present

## 2020-03-13 DIAGNOSIS — Z789 Other specified health status: Secondary | ICD-10-CM | POA: Diagnosis not present

## 2020-03-13 DIAGNOSIS — Z79891 Long term (current) use of opiate analgesic: Secondary | ICD-10-CM | POA: Diagnosis not present

## 2020-03-13 DIAGNOSIS — D45 Polycythemia vera: Secondary | ICD-10-CM | POA: Diagnosis not present

## 2020-03-13 DIAGNOSIS — G6181 Chronic inflammatory demyelinating polyneuritis: Secondary | ICD-10-CM | POA: Diagnosis not present

## 2020-03-13 DIAGNOSIS — D62 Acute posthemorrhagic anemia: Secondary | ICD-10-CM | POA: Diagnosis not present

## 2020-03-13 DIAGNOSIS — F4321 Adjustment disorder with depressed mood: Secondary | ICD-10-CM | POA: Diagnosis not present

## 2020-03-13 DIAGNOSIS — R946 Abnormal results of thyroid function studies: Secondary | ICD-10-CM | POA: Diagnosis not present

## 2020-03-13 DIAGNOSIS — M4180 Other forms of scoliosis, site unspecified: Secondary | ICD-10-CM | POA: Diagnosis not present

## 2020-03-13 DIAGNOSIS — I442 Atrioventricular block, complete: Secondary | ICD-10-CM | POA: Diagnosis not present

## 2020-03-13 DIAGNOSIS — H353131 Nonexudative age-related macular degeneration, bilateral, early dry stage: Secondary | ICD-10-CM | POA: Diagnosis not present

## 2020-03-13 DIAGNOSIS — M21961 Unspecified acquired deformity of right lower leg: Secondary | ICD-10-CM | POA: Diagnosis not present

## 2020-03-13 DIAGNOSIS — K5730 Diverticulosis of large intestine without perforation or abscess without bleeding: Secondary | ICD-10-CM | POA: Diagnosis not present

## 2020-03-13 DIAGNOSIS — H60313 Diffuse otitis externa, bilateral: Secondary | ICD-10-CM | POA: Diagnosis not present

## 2020-03-13 DIAGNOSIS — J9611 Chronic respiratory failure with hypoxia: Secondary | ICD-10-CM | POA: Diagnosis not present

## 2020-03-13 DIAGNOSIS — Z4789 Encounter for other orthopedic aftercare: Secondary | ICD-10-CM | POA: Diagnosis not present

## 2020-03-13 DIAGNOSIS — R0982 Postnasal drip: Secondary | ICD-10-CM | POA: Diagnosis not present

## 2020-03-13 DIAGNOSIS — H35373 Puckering of macula, bilateral: Secondary | ICD-10-CM | POA: Diagnosis not present

## 2020-03-13 DIAGNOSIS — S4292XA Fracture of left shoulder girdle, part unspecified, initial encounter for closed fracture: Secondary | ICD-10-CM | POA: Diagnosis not present

## 2020-03-13 DIAGNOSIS — R911 Solitary pulmonary nodule: Secondary | ICD-10-CM | POA: Diagnosis not present

## 2020-03-13 DIAGNOSIS — L988 Other specified disorders of the skin and subcutaneous tissue: Secondary | ICD-10-CM | POA: Diagnosis not present

## 2020-03-13 DIAGNOSIS — R202 Paresthesia of skin: Secondary | ICD-10-CM | POA: Diagnosis not present

## 2020-03-13 DIAGNOSIS — L821 Other seborrheic keratosis: Secondary | ICD-10-CM | POA: Diagnosis not present

## 2020-03-13 DIAGNOSIS — M75112 Incomplete rotator cuff tear or rupture of left shoulder, not specified as traumatic: Secondary | ICD-10-CM | POA: Diagnosis not present

## 2020-03-13 DIAGNOSIS — Z801 Family history of malignant neoplasm of trachea, bronchus and lung: Secondary | ICD-10-CM | POA: Diagnosis not present

## 2020-03-13 DIAGNOSIS — S63511D Sprain of carpal joint of right wrist, subsequent encounter: Secondary | ICD-10-CM | POA: Diagnosis not present

## 2020-03-13 DIAGNOSIS — R7301 Impaired fasting glucose: Secondary | ICD-10-CM | POA: Diagnosis not present

## 2020-03-13 DIAGNOSIS — M4326 Fusion of spine, lumbar region: Secondary | ICD-10-CM | POA: Diagnosis not present

## 2020-03-13 DIAGNOSIS — L819 Disorder of pigmentation, unspecified: Secondary | ICD-10-CM | POA: Diagnosis not present

## 2020-03-13 DIAGNOSIS — I25758 Atherosclerosis of native coronary artery of transplanted heart with other forms of angina pectoris: Secondary | ICD-10-CM | POA: Diagnosis not present

## 2020-03-13 DIAGNOSIS — H353114 Nonexudative age-related macular degeneration, right eye, advanced atrophic with subfoveal involvement: Secondary | ICD-10-CM | POA: Diagnosis not present

## 2020-03-13 DIAGNOSIS — H59032 Cystoid macular edema following cataract surgery, left eye: Secondary | ICD-10-CM | POA: Diagnosis not present

## 2020-03-13 DIAGNOSIS — R296 Repeated falls: Secondary | ICD-10-CM | POA: Diagnosis not present

## 2020-03-13 DIAGNOSIS — M79644 Pain in right finger(s): Secondary | ICD-10-CM | POA: Diagnosis not present

## 2020-03-13 DIAGNOSIS — J90 Pleural effusion, not elsewhere classified: Secondary | ICD-10-CM | POA: Diagnosis not present

## 2020-03-13 DIAGNOSIS — D731 Hypersplenism: Secondary | ICD-10-CM | POA: Diagnosis not present

## 2020-03-13 DIAGNOSIS — F322 Major depressive disorder, single episode, severe without psychotic features: Secondary | ICD-10-CM | POA: Diagnosis not present

## 2020-03-13 DIAGNOSIS — M5432 Sciatica, left side: Secondary | ICD-10-CM | POA: Diagnosis not present

## 2020-03-13 DIAGNOSIS — N644 Mastodynia: Secondary | ICD-10-CM | POA: Diagnosis not present

## 2020-03-13 DIAGNOSIS — H18413 Arcus senilis, bilateral: Secondary | ICD-10-CM | POA: Diagnosis not present

## 2020-03-13 DIAGNOSIS — N959 Unspecified menopausal and perimenopausal disorder: Secondary | ICD-10-CM | POA: Diagnosis not present

## 2020-03-13 DIAGNOSIS — R1310 Dysphagia, unspecified: Secondary | ICD-10-CM | POA: Diagnosis not present

## 2020-03-13 DIAGNOSIS — Z83511 Family history of glaucoma: Secondary | ICD-10-CM | POA: Diagnosis not present

## 2020-03-13 DIAGNOSIS — E790 Hyperuricemia without signs of inflammatory arthritis and tophaceous disease: Secondary | ICD-10-CM | POA: Diagnosis not present

## 2020-03-13 DIAGNOSIS — I2699 Other pulmonary embolism without acute cor pulmonale: Secondary | ICD-10-CM | POA: Diagnosis not present

## 2020-03-13 DIAGNOSIS — Z515 Encounter for palliative care: Secondary | ICD-10-CM | POA: Diagnosis not present

## 2020-03-13 DIAGNOSIS — C541 Malignant neoplasm of endometrium: Secondary | ICD-10-CM | POA: Diagnosis not present

## 2020-03-13 DIAGNOSIS — K5090 Crohn's disease, unspecified, without complications: Secondary | ICD-10-CM | POA: Diagnosis not present

## 2020-03-13 DIAGNOSIS — M25622 Stiffness of left elbow, not elsewhere classified: Secondary | ICD-10-CM | POA: Diagnosis not present

## 2020-03-13 DIAGNOSIS — M9901 Segmental and somatic dysfunction of cervical region: Secondary | ICD-10-CM | POA: Diagnosis not present

## 2020-03-13 DIAGNOSIS — Z8049 Family history of malignant neoplasm of other genital organs: Secondary | ICD-10-CM | POA: Diagnosis not present

## 2020-03-13 DIAGNOSIS — I6782 Cerebral ischemia: Secondary | ICD-10-CM | POA: Diagnosis not present

## 2020-03-13 DIAGNOSIS — Z6827 Body mass index (BMI) 27.0-27.9, adult: Secondary | ICD-10-CM | POA: Diagnosis not present

## 2020-03-13 DIAGNOSIS — M25661 Stiffness of right knee, not elsewhere classified: Secondary | ICD-10-CM | POA: Diagnosis not present

## 2020-03-13 DIAGNOSIS — Z435 Encounter for attention to cystostomy: Secondary | ICD-10-CM | POA: Diagnosis not present

## 2020-03-13 DIAGNOSIS — N312 Flaccid neuropathic bladder, not elsewhere classified: Secondary | ICD-10-CM | POA: Diagnosis not present

## 2020-03-13 DIAGNOSIS — H3530 Unspecified macular degeneration: Secondary | ICD-10-CM | POA: Diagnosis not present

## 2020-03-13 DIAGNOSIS — M7732 Calcaneal spur, left foot: Secondary | ICD-10-CM | POA: Diagnosis not present

## 2020-03-13 DIAGNOSIS — M9903 Segmental and somatic dysfunction of lumbar region: Secondary | ICD-10-CM | POA: Diagnosis not present

## 2020-03-13 DIAGNOSIS — N6489 Other specified disorders of breast: Secondary | ICD-10-CM | POA: Diagnosis not present

## 2020-03-13 DIAGNOSIS — M21962 Unspecified acquired deformity of left lower leg: Secondary | ICD-10-CM | POA: Diagnosis not present

## 2020-03-13 DIAGNOSIS — G20 Parkinson's disease: Secondary | ICD-10-CM | POA: Diagnosis not present

## 2020-03-13 DIAGNOSIS — Z6820 Body mass index (BMI) 20.0-20.9, adult: Secondary | ICD-10-CM | POA: Diagnosis not present

## 2020-03-13 DIAGNOSIS — J910 Malignant pleural effusion: Secondary | ICD-10-CM | POA: Diagnosis not present

## 2020-03-13 DIAGNOSIS — M25411 Effusion, right shoulder: Secondary | ICD-10-CM | POA: Diagnosis not present

## 2020-03-13 DIAGNOSIS — M50121 Cervical disc disorder at C4-C5 level with radiculopathy: Secondary | ICD-10-CM | POA: Diagnosis not present

## 2020-03-13 DIAGNOSIS — Z719 Counseling, unspecified: Secondary | ICD-10-CM | POA: Diagnosis not present

## 2020-03-13 DIAGNOSIS — D471 Chronic myeloproliferative disease: Secondary | ICD-10-CM | POA: Diagnosis not present

## 2020-03-13 DIAGNOSIS — N342 Other urethritis: Secondary | ICD-10-CM | POA: Diagnosis not present

## 2020-03-13 DIAGNOSIS — Z13228 Encounter for screening for other metabolic disorders: Secondary | ICD-10-CM | POA: Diagnosis not present

## 2020-03-13 DIAGNOSIS — M545 Low back pain: Secondary | ICD-10-CM | POA: Diagnosis not present

## 2020-03-13 DIAGNOSIS — I471 Supraventricular tachycardia: Secondary | ICD-10-CM | POA: Diagnosis not present

## 2020-03-13 DIAGNOSIS — J342 Deviated nasal septum: Secondary | ICD-10-CM | POA: Diagnosis not present

## 2020-03-13 DIAGNOSIS — L97221 Non-pressure chronic ulcer of left calf limited to breakdown of skin: Secondary | ICD-10-CM | POA: Diagnosis not present

## 2020-03-13 DIAGNOSIS — M549 Dorsalgia, unspecified: Secondary | ICD-10-CM | POA: Diagnosis not present

## 2020-03-13 DIAGNOSIS — F1410 Cocaine abuse, uncomplicated: Secondary | ICD-10-CM | POA: Diagnosis not present

## 2020-03-13 DIAGNOSIS — R937 Abnormal findings on diagnostic imaging of other parts of musculoskeletal system: Secondary | ICD-10-CM | POA: Diagnosis not present

## 2020-03-13 DIAGNOSIS — H40033 Anatomical narrow angle, bilateral: Secondary | ICD-10-CM | POA: Diagnosis not present

## 2020-03-13 DIAGNOSIS — C4441 Basal cell carcinoma of skin of scalp and neck: Secondary | ICD-10-CM | POA: Diagnosis not present

## 2020-03-13 DIAGNOSIS — I728 Aneurysm of other specified arteries: Secondary | ICD-10-CM | POA: Diagnosis not present

## 2020-03-13 DIAGNOSIS — M530 Cervicocranial syndrome: Secondary | ICD-10-CM | POA: Diagnosis not present

## 2020-03-13 DIAGNOSIS — C210 Malignant neoplasm of anus, unspecified: Secondary | ICD-10-CM | POA: Diagnosis not present

## 2020-03-13 DIAGNOSIS — Z96652 Presence of left artificial knee joint: Secondary | ICD-10-CM | POA: Diagnosis not present

## 2020-03-13 DIAGNOSIS — E6609 Other obesity due to excess calories: Secondary | ICD-10-CM | POA: Diagnosis not present

## 2020-03-13 DIAGNOSIS — Z1211 Encounter for screening for malignant neoplasm of colon: Secondary | ICD-10-CM | POA: Diagnosis not present

## 2020-03-13 DIAGNOSIS — Z1339 Encounter for screening examination for other mental health and behavioral disorders: Secondary | ICD-10-CM | POA: Diagnosis not present

## 2020-03-13 DIAGNOSIS — G609 Hereditary and idiopathic neuropathy, unspecified: Secondary | ICD-10-CM | POA: Diagnosis not present

## 2020-03-13 DIAGNOSIS — G894 Chronic pain syndrome: Secondary | ICD-10-CM | POA: Diagnosis not present

## 2020-03-13 DIAGNOSIS — M859 Disorder of bone density and structure, unspecified: Secondary | ICD-10-CM | POA: Diagnosis not present

## 2020-03-13 DIAGNOSIS — N390 Urinary tract infection, site not specified: Secondary | ICD-10-CM | POA: Diagnosis not present

## 2020-03-13 DIAGNOSIS — M7071 Other bursitis of hip, right hip: Secondary | ICD-10-CM | POA: Diagnosis not present

## 2020-03-13 DIAGNOSIS — N3946 Mixed incontinence: Secondary | ICD-10-CM | POA: Diagnosis not present

## 2020-03-13 DIAGNOSIS — Z8709 Personal history of other diseases of the respiratory system: Secondary | ICD-10-CM | POA: Diagnosis not present

## 2020-03-13 DIAGNOSIS — M79641 Pain in right hand: Secondary | ICD-10-CM | POA: Diagnosis not present

## 2020-03-13 DIAGNOSIS — F3181 Bipolar II disorder: Secondary | ICD-10-CM | POA: Diagnosis not present

## 2020-03-13 DIAGNOSIS — Z954 Presence of other heart-valve replacement: Secondary | ICD-10-CM | POA: Diagnosis not present

## 2020-03-13 DIAGNOSIS — Z881 Allergy status to other antibiotic agents status: Secondary | ICD-10-CM | POA: Diagnosis not present

## 2020-03-13 DIAGNOSIS — K602 Anal fissure, unspecified: Secondary | ICD-10-CM | POA: Diagnosis not present

## 2020-03-13 DIAGNOSIS — S0191XA Laceration without foreign body of unspecified part of head, initial encounter: Secondary | ICD-10-CM | POA: Diagnosis not present

## 2020-03-13 DIAGNOSIS — Z9484 Stem cells transplant status: Secondary | ICD-10-CM | POA: Diagnosis not present

## 2020-03-13 DIAGNOSIS — I680 Cerebral amyloid angiopathy: Secondary | ICD-10-CM | POA: Diagnosis not present

## 2020-03-13 DIAGNOSIS — Z95828 Presence of other vascular implants and grafts: Secondary | ICD-10-CM | POA: Diagnosis not present

## 2020-03-13 DIAGNOSIS — K821 Hydrops of gallbladder: Secondary | ICD-10-CM | POA: Diagnosis not present

## 2020-03-13 DIAGNOSIS — T07XXXA Unspecified multiple injuries, initial encounter: Secondary | ICD-10-CM | POA: Diagnosis not present

## 2020-03-13 DIAGNOSIS — S2243XA Multiple fractures of ribs, bilateral, initial encounter for closed fracture: Secondary | ICD-10-CM | POA: Diagnosis not present

## 2020-03-13 DIAGNOSIS — Z8551 Personal history of malignant neoplasm of bladder: Secondary | ICD-10-CM | POA: Diagnosis not present

## 2020-03-13 DIAGNOSIS — L97521 Non-pressure chronic ulcer of other part of left foot limited to breakdown of skin: Secondary | ICD-10-CM | POA: Diagnosis not present

## 2020-03-13 DIAGNOSIS — D61818 Other pancytopenia: Secondary | ICD-10-CM | POA: Diagnosis not present

## 2020-03-13 DIAGNOSIS — E781 Pure hyperglyceridemia: Secondary | ICD-10-CM | POA: Diagnosis not present

## 2020-03-13 DIAGNOSIS — H35033 Hypertensive retinopathy, bilateral: Secondary | ICD-10-CM | POA: Diagnosis not present

## 2020-03-13 DIAGNOSIS — M65342 Trigger finger, left ring finger: Secondary | ICD-10-CM | POA: Diagnosis not present

## 2020-03-13 DIAGNOSIS — S8264XD Nondisplaced fracture of lateral malleolus of right fibula, subsequent encounter for closed fracture with routine healing: Secondary | ICD-10-CM | POA: Diagnosis not present

## 2020-03-13 DIAGNOSIS — D485 Neoplasm of uncertain behavior of skin: Secondary | ICD-10-CM | POA: Diagnosis not present

## 2020-03-13 DIAGNOSIS — S32592A Other specified fracture of left pubis, initial encounter for closed fracture: Secondary | ICD-10-CM | POA: Diagnosis not present

## 2020-03-13 DIAGNOSIS — H9193 Unspecified hearing loss, bilateral: Secondary | ICD-10-CM | POA: Diagnosis not present

## 2020-03-13 DIAGNOSIS — M069 Rheumatoid arthritis, unspecified: Secondary | ICD-10-CM | POA: Diagnosis not present

## 2020-03-13 DIAGNOSIS — S0101XD Laceration without foreign body of scalp, subsequent encounter: Secondary | ICD-10-CM | POA: Diagnosis not present

## 2020-03-13 DIAGNOSIS — H903 Sensorineural hearing loss, bilateral: Secondary | ICD-10-CM | POA: Diagnosis not present

## 2020-03-13 DIAGNOSIS — R945 Abnormal results of liver function studies: Secondary | ICD-10-CM | POA: Diagnosis not present

## 2020-03-13 DIAGNOSIS — K5900 Constipation, unspecified: Secondary | ICD-10-CM | POA: Diagnosis not present

## 2020-03-13 DIAGNOSIS — R9431 Abnormal electrocardiogram [ECG] [EKG]: Secondary | ICD-10-CM | POA: Diagnosis not present

## 2020-03-13 DIAGNOSIS — Z932 Ileostomy status: Secondary | ICD-10-CM | POA: Diagnosis not present

## 2020-03-13 DIAGNOSIS — H9113 Presbycusis, bilateral: Secondary | ICD-10-CM | POA: Diagnosis not present

## 2020-03-13 DIAGNOSIS — H43811 Vitreous degeneration, right eye: Secondary | ICD-10-CM | POA: Diagnosis not present

## 2020-03-13 DIAGNOSIS — M25511 Pain in right shoulder: Secondary | ICD-10-CM | POA: Diagnosis not present

## 2020-03-13 DIAGNOSIS — J929 Pleural plaque without asbestos: Secondary | ICD-10-CM | POA: Diagnosis not present

## 2020-03-13 DIAGNOSIS — T63451D Toxic effect of venom of hornets, accidental (unintentional), subsequent encounter: Secondary | ICD-10-CM | POA: Diagnosis not present

## 2020-03-13 DIAGNOSIS — T782XXD Anaphylactic shock, unspecified, subsequent encounter: Secondary | ICD-10-CM | POA: Diagnosis not present

## 2020-03-13 DIAGNOSIS — R0602 Shortness of breath: Secondary | ICD-10-CM | POA: Diagnosis not present

## 2020-03-13 DIAGNOSIS — Z0001 Encounter for general adult medical examination with abnormal findings: Secondary | ICD-10-CM | POA: Diagnosis not present

## 2020-03-13 DIAGNOSIS — N1831 Chronic kidney disease, stage 3a: Secondary | ICD-10-CM | POA: Diagnosis not present

## 2020-03-13 DIAGNOSIS — C3412 Malignant neoplasm of upper lobe, left bronchus or lung: Secondary | ICD-10-CM | POA: Diagnosis not present

## 2020-03-13 DIAGNOSIS — I25810 Atherosclerosis of coronary artery bypass graft(s) without angina pectoris: Secondary | ICD-10-CM | POA: Diagnosis not present

## 2020-03-13 DIAGNOSIS — N813 Complete uterovaginal prolapse: Secondary | ICD-10-CM | POA: Diagnosis not present

## 2020-03-13 DIAGNOSIS — K7469 Other cirrhosis of liver: Secondary | ICD-10-CM | POA: Diagnosis not present

## 2020-03-13 DIAGNOSIS — D1721 Benign lipomatous neoplasm of skin and subcutaneous tissue of right arm: Secondary | ICD-10-CM | POA: Diagnosis not present

## 2020-03-13 DIAGNOSIS — C3491 Malignant neoplasm of unspecified part of right bronchus or lung: Secondary | ICD-10-CM | POA: Diagnosis not present

## 2020-03-13 DIAGNOSIS — H25811 Combined forms of age-related cataract, right eye: Secondary | ICD-10-CM | POA: Diagnosis not present

## 2020-03-13 DIAGNOSIS — H35412 Lattice degeneration of retina, left eye: Secondary | ICD-10-CM | POA: Diagnosis not present

## 2020-03-13 DIAGNOSIS — I739 Peripheral vascular disease, unspecified: Secondary | ICD-10-CM | POA: Diagnosis not present

## 2020-03-13 DIAGNOSIS — S46011A Strain of muscle(s) and tendon(s) of the rotator cuff of right shoulder, initial encounter: Secondary | ICD-10-CM | POA: Diagnosis not present

## 2020-03-13 DIAGNOSIS — L218 Other seborrheic dermatitis: Secondary | ICD-10-CM | POA: Diagnosis not present

## 2020-03-13 DIAGNOSIS — I5042 Chronic combined systolic (congestive) and diastolic (congestive) heart failure: Secondary | ICD-10-CM | POA: Diagnosis not present

## 2020-03-13 DIAGNOSIS — C8290 Follicular lymphoma, unspecified, unspecified site: Secondary | ICD-10-CM | POA: Diagnosis not present

## 2020-03-13 DIAGNOSIS — Z8619 Personal history of other infectious and parasitic diseases: Secondary | ICD-10-CM | POA: Diagnosis not present

## 2020-03-13 DIAGNOSIS — I8312 Varicose veins of left lower extremity with inflammation: Secondary | ICD-10-CM | POA: Diagnosis not present

## 2020-03-13 DIAGNOSIS — T8189XA Other complications of procedures, not elsewhere classified, initial encounter: Secondary | ICD-10-CM | POA: Diagnosis not present

## 2020-03-13 DIAGNOSIS — J849 Interstitial pulmonary disease, unspecified: Secondary | ICD-10-CM | POA: Diagnosis not present

## 2020-03-13 DIAGNOSIS — R159 Full incontinence of feces: Secondary | ICD-10-CM | POA: Diagnosis not present

## 2020-03-13 DIAGNOSIS — I509 Heart failure, unspecified: Secondary | ICD-10-CM | POA: Diagnosis not present

## 2020-03-13 DIAGNOSIS — I70212 Atherosclerosis of native arteries of extremities with intermittent claudication, left leg: Secondary | ICD-10-CM | POA: Diagnosis not present

## 2020-03-13 DIAGNOSIS — G5602 Carpal tunnel syndrome, left upper limb: Secondary | ICD-10-CM | POA: Diagnosis not present

## 2020-03-13 DIAGNOSIS — K5903 Drug induced constipation: Secondary | ICD-10-CM | POA: Diagnosis not present

## 2020-03-13 DIAGNOSIS — L02212 Cutaneous abscess of back [any part, except buttock]: Secondary | ICD-10-CM | POA: Diagnosis not present

## 2020-03-13 DIAGNOSIS — H6692 Otitis media, unspecified, left ear: Secondary | ICD-10-CM | POA: Diagnosis not present

## 2020-03-13 DIAGNOSIS — E349 Endocrine disorder, unspecified: Secondary | ICD-10-CM | POA: Diagnosis not present

## 2020-03-13 DIAGNOSIS — N809 Endometriosis, unspecified: Secondary | ICD-10-CM | POA: Diagnosis not present

## 2020-03-13 DIAGNOSIS — C7800 Secondary malignant neoplasm of unspecified lung: Secondary | ICD-10-CM | POA: Diagnosis not present

## 2020-03-13 DIAGNOSIS — C673 Malignant neoplasm of anterior wall of bladder: Secondary | ICD-10-CM | POA: Diagnosis not present

## 2020-03-13 DIAGNOSIS — R3989 Other symptoms and signs involving the genitourinary system: Secondary | ICD-10-CM | POA: Diagnosis not present

## 2020-03-13 DIAGNOSIS — S20212D Contusion of left front wall of thorax, subsequent encounter: Secondary | ICD-10-CM | POA: Diagnosis not present

## 2020-03-13 DIAGNOSIS — S29012A Strain of muscle and tendon of back wall of thorax, initial encounter: Secondary | ICD-10-CM | POA: Diagnosis not present

## 2020-03-13 DIAGNOSIS — R6881 Early satiety: Secondary | ICD-10-CM | POA: Diagnosis not present

## 2020-03-13 DIAGNOSIS — I679 Cerebrovascular disease, unspecified: Secondary | ICD-10-CM | POA: Diagnosis not present

## 2020-03-13 DIAGNOSIS — F320 Major depressive disorder, single episode, mild: Secondary | ICD-10-CM | POA: Diagnosis not present

## 2020-03-13 DIAGNOSIS — J432 Centrilobular emphysema: Secondary | ICD-10-CM | POA: Diagnosis not present

## 2020-03-13 DIAGNOSIS — Z96641 Presence of right artificial hip joint: Secondary | ICD-10-CM | POA: Diagnosis not present

## 2020-03-13 DIAGNOSIS — D1800 Hemangioma unspecified site: Secondary | ICD-10-CM | POA: Diagnosis not present

## 2020-03-13 DIAGNOSIS — M19012 Primary osteoarthritis, left shoulder: Secondary | ICD-10-CM | POA: Diagnosis not present

## 2020-03-13 DIAGNOSIS — R197 Diarrhea, unspecified: Secondary | ICD-10-CM | POA: Diagnosis not present

## 2020-03-13 DIAGNOSIS — M65331 Trigger finger, right middle finger: Secondary | ICD-10-CM | POA: Diagnosis not present

## 2020-03-13 DIAGNOSIS — D3132 Benign neoplasm of left choroid: Secondary | ICD-10-CM | POA: Diagnosis not present

## 2020-03-13 DIAGNOSIS — R748 Abnormal levels of other serum enzymes: Secondary | ICD-10-CM | POA: Diagnosis not present

## 2020-03-13 DIAGNOSIS — M0579 Rheumatoid arthritis with rheumatoid factor of multiple sites without organ or systems involvement: Secondary | ICD-10-CM | POA: Diagnosis not present

## 2020-03-13 DIAGNOSIS — H25813 Combined forms of age-related cataract, bilateral: Secondary | ICD-10-CM | POA: Diagnosis not present

## 2020-03-13 DIAGNOSIS — G4489 Other headache syndrome: Secondary | ICD-10-CM | POA: Diagnosis not present

## 2020-03-13 DIAGNOSIS — J438 Other emphysema: Secondary | ICD-10-CM | POA: Diagnosis not present

## 2020-03-13 DIAGNOSIS — I10 Essential (primary) hypertension: Secondary | ICD-10-CM | POA: Diagnosis not present

## 2020-03-13 DIAGNOSIS — M7712 Lateral epicondylitis, left elbow: Secondary | ICD-10-CM | POA: Diagnosis not present

## 2020-03-13 DIAGNOSIS — K219 Gastro-esophageal reflux disease without esophagitis: Secondary | ICD-10-CM | POA: Diagnosis not present

## 2020-03-13 DIAGNOSIS — D692 Other nonthrombocytopenic purpura: Secondary | ICD-10-CM | POA: Diagnosis not present

## 2020-03-13 DIAGNOSIS — F329 Major depressive disorder, single episode, unspecified: Secondary | ICD-10-CM | POA: Diagnosis not present

## 2020-03-13 DIAGNOSIS — H26491 Other secondary cataract, right eye: Secondary | ICD-10-CM | POA: Diagnosis not present

## 2020-03-13 DIAGNOSIS — I429 Cardiomyopathy, unspecified: Secondary | ICD-10-CM | POA: Diagnosis not present

## 2020-03-13 DIAGNOSIS — F418 Other specified anxiety disorders: Secondary | ICD-10-CM | POA: Diagnosis not present

## 2020-03-13 DIAGNOSIS — F325 Major depressive disorder, single episode, in full remission: Secondary | ICD-10-CM | POA: Diagnosis not present

## 2020-03-13 DIAGNOSIS — H18593 Other hereditary corneal dystrophies, bilateral: Secondary | ICD-10-CM | POA: Diagnosis not present

## 2020-03-13 DIAGNOSIS — G450 Vertebro-basilar artery syndrome: Secondary | ICD-10-CM | POA: Diagnosis not present

## 2020-03-13 DIAGNOSIS — Z9981 Dependence on supplemental oxygen: Secondary | ICD-10-CM | POA: Diagnosis not present

## 2020-03-13 DIAGNOSIS — Z91040 Latex allergy status: Secondary | ICD-10-CM | POA: Diagnosis not present

## 2020-03-13 DIAGNOSIS — Z720 Tobacco use: Secondary | ICD-10-CM | POA: Diagnosis not present

## 2020-03-13 DIAGNOSIS — G43909 Migraine, unspecified, not intractable, without status migrainosus: Secondary | ICD-10-CM | POA: Diagnosis not present

## 2020-03-13 DIAGNOSIS — R143 Flatulence: Secondary | ICD-10-CM | POA: Diagnosis not present

## 2020-03-13 DIAGNOSIS — I4891 Unspecified atrial fibrillation: Secondary | ICD-10-CM | POA: Diagnosis not present

## 2020-03-13 DIAGNOSIS — G5601 Carpal tunnel syndrome, right upper limb: Secondary | ICD-10-CM | POA: Diagnosis not present

## 2020-03-13 DIAGNOSIS — K828 Other specified diseases of gallbladder: Secondary | ICD-10-CM | POA: Diagnosis not present

## 2020-03-13 DIAGNOSIS — E103313 Type 1 diabetes mellitus with moderate nonproliferative diabetic retinopathy with macular edema, bilateral: Secondary | ICD-10-CM | POA: Diagnosis not present

## 2020-03-13 DIAGNOSIS — Z1212 Encounter for screening for malignant neoplasm of rectum: Secondary | ICD-10-CM | POA: Diagnosis not present

## 2020-03-13 DIAGNOSIS — M238X2 Other internal derangements of left knee: Secondary | ICD-10-CM | POA: Diagnosis not present

## 2020-03-13 DIAGNOSIS — K760 Fatty (change of) liver, not elsewhere classified: Secondary | ICD-10-CM | POA: Diagnosis not present

## 2020-03-13 DIAGNOSIS — K5909 Other constipation: Secondary | ICD-10-CM | POA: Diagnosis not present

## 2020-03-13 DIAGNOSIS — S82831D Other fracture of upper and lower end of right fibula, subsequent encounter for closed fracture with routine healing: Secondary | ICD-10-CM | POA: Diagnosis not present

## 2020-03-13 DIAGNOSIS — R413 Other amnesia: Secondary | ICD-10-CM | POA: Diagnosis not present

## 2020-03-13 DIAGNOSIS — Z1389 Encounter for screening for other disorder: Secondary | ICD-10-CM | POA: Diagnosis not present

## 2020-03-13 DIAGNOSIS — Z09 Encounter for follow-up examination after completed treatment for conditions other than malignant neoplasm: Secondary | ICD-10-CM | POA: Diagnosis not present

## 2020-03-13 DIAGNOSIS — M1811 Unilateral primary osteoarthritis of first carpometacarpal joint, right hand: Secondary | ICD-10-CM | POA: Diagnosis not present

## 2020-03-13 DIAGNOSIS — B353 Tinea pedis: Secondary | ICD-10-CM | POA: Diagnosis not present

## 2020-03-13 DIAGNOSIS — S8262XD Displaced fracture of lateral malleolus of left fibula, subsequent encounter for closed fracture with routine healing: Secondary | ICD-10-CM | POA: Diagnosis not present

## 2020-03-13 DIAGNOSIS — C50311 Malignant neoplasm of lower-inner quadrant of right female breast: Secondary | ICD-10-CM | POA: Diagnosis not present

## 2020-03-13 DIAGNOSIS — M2550 Pain in unspecified joint: Secondary | ICD-10-CM | POA: Diagnosis not present

## 2020-03-13 DIAGNOSIS — Z6824 Body mass index (BMI) 24.0-24.9, adult: Secondary | ICD-10-CM | POA: Diagnosis not present

## 2020-03-13 DIAGNOSIS — S73192A Other sprain of left hip, initial encounter: Secondary | ICD-10-CM | POA: Diagnosis not present

## 2020-03-13 DIAGNOSIS — Z639 Problem related to primary support group, unspecified: Secondary | ICD-10-CM | POA: Diagnosis not present

## 2020-03-13 DIAGNOSIS — C9210 Chronic myeloid leukemia, BCR/ABL-positive, not having achieved remission: Secondary | ICD-10-CM | POA: Diagnosis not present

## 2020-03-13 DIAGNOSIS — C50912 Malignant neoplasm of unspecified site of left female breast: Secondary | ICD-10-CM | POA: Diagnosis not present

## 2020-03-13 DIAGNOSIS — B356 Tinea cruris: Secondary | ICD-10-CM | POA: Diagnosis not present

## 2020-03-13 DIAGNOSIS — G919 Hydrocephalus, unspecified: Secondary | ICD-10-CM | POA: Diagnosis not present

## 2020-03-13 DIAGNOSIS — D0580 Other specified type of carcinoma in situ of unspecified breast: Secondary | ICD-10-CM | POA: Diagnosis not present

## 2020-03-13 DIAGNOSIS — R262 Difficulty in walking, not elsewhere classified: Secondary | ICD-10-CM | POA: Diagnosis not present

## 2020-03-13 DIAGNOSIS — Z6829 Body mass index (BMI) 29.0-29.9, adult: Secondary | ICD-10-CM | POA: Diagnosis not present

## 2020-03-13 DIAGNOSIS — D519 Vitamin B12 deficiency anemia, unspecified: Secondary | ICD-10-CM | POA: Diagnosis not present

## 2020-03-13 DIAGNOSIS — M25542 Pain in joints of left hand: Secondary | ICD-10-CM | POA: Diagnosis not present

## 2020-03-13 DIAGNOSIS — E118 Type 2 diabetes mellitus with unspecified complications: Secondary | ICD-10-CM | POA: Diagnosis not present

## 2020-03-13 DIAGNOSIS — M9902 Segmental and somatic dysfunction of thoracic region: Secondary | ICD-10-CM | POA: Diagnosis not present

## 2020-03-13 DIAGNOSIS — K5901 Slow transit constipation: Secondary | ICD-10-CM | POA: Diagnosis not present

## 2020-03-13 DIAGNOSIS — H04123 Dry eye syndrome of bilateral lacrimal glands: Secondary | ICD-10-CM | POA: Diagnosis not present

## 2020-03-13 DIAGNOSIS — H5203 Hypermetropia, bilateral: Secondary | ICD-10-CM | POA: Diagnosis not present

## 2020-03-13 DIAGNOSIS — Z08 Encounter for follow-up examination after completed treatment for malignant neoplasm: Secondary | ICD-10-CM | POA: Diagnosis not present

## 2020-03-13 DIAGNOSIS — Z538 Procedure and treatment not carried out for other reasons: Secondary | ICD-10-CM | POA: Diagnosis not present

## 2020-03-13 DIAGNOSIS — X32XXXA Exposure to sunlight, initial encounter: Secondary | ICD-10-CM | POA: Diagnosis not present

## 2020-03-13 DIAGNOSIS — I495 Sick sinus syndrome: Secondary | ICD-10-CM | POA: Diagnosis not present

## 2020-03-13 DIAGNOSIS — F5105 Insomnia due to other mental disorder: Secondary | ICD-10-CM | POA: Diagnosis not present

## 2020-03-13 DIAGNOSIS — N951 Menopausal and female climacteric states: Secondary | ICD-10-CM | POA: Diagnosis not present

## 2020-03-13 DIAGNOSIS — I4589 Other specified conduction disorders: Secondary | ICD-10-CM | POA: Diagnosis not present

## 2020-03-13 DIAGNOSIS — R55 Syncope and collapse: Secondary | ICD-10-CM | POA: Diagnosis not present

## 2020-03-13 DIAGNOSIS — M50122 Cervical disc disorder at C5-C6 level with radiculopathy: Secondary | ICD-10-CM | POA: Diagnosis not present

## 2020-03-13 DIAGNOSIS — F1021 Alcohol dependence, in remission: Secondary | ICD-10-CM | POA: Diagnosis not present

## 2020-03-13 DIAGNOSIS — M6281 Muscle weakness (generalized): Secondary | ICD-10-CM | POA: Diagnosis not present

## 2020-03-13 DIAGNOSIS — Z9884 Bariatric surgery status: Secondary | ICD-10-CM | POA: Diagnosis not present

## 2020-03-13 DIAGNOSIS — R200 Anesthesia of skin: Secondary | ICD-10-CM | POA: Diagnosis not present

## 2020-03-14 DIAGNOSIS — M2032 Hallux varus (acquired), left foot: Secondary | ICD-10-CM | POA: Diagnosis not present

## 2020-03-14 DIAGNOSIS — E038 Other specified hypothyroidism: Secondary | ICD-10-CM | POA: Diagnosis not present

## 2020-03-14 DIAGNOSIS — C786 Secondary malignant neoplasm of retroperitoneum and peritoneum: Secondary | ICD-10-CM | POA: Diagnosis not present

## 2020-03-14 DIAGNOSIS — R5382 Chronic fatigue, unspecified: Secondary | ICD-10-CM | POA: Diagnosis not present

## 2020-03-14 DIAGNOSIS — M75102 Unspecified rotator cuff tear or rupture of left shoulder, not specified as traumatic: Secondary | ICD-10-CM | POA: Diagnosis not present

## 2020-03-14 DIAGNOSIS — I5042 Chronic combined systolic (congestive) and diastolic (congestive) heart failure: Secondary | ICD-10-CM | POA: Diagnosis not present

## 2020-03-14 DIAGNOSIS — L97312 Non-pressure chronic ulcer of right ankle with fat layer exposed: Secondary | ICD-10-CM | POA: Diagnosis not present

## 2020-03-14 DIAGNOSIS — Z79899 Other long term (current) drug therapy: Secondary | ICD-10-CM | POA: Diagnosis not present

## 2020-03-14 DIAGNOSIS — Z8249 Family history of ischemic heart disease and other diseases of the circulatory system: Secondary | ICD-10-CM | POA: Diagnosis not present

## 2020-03-14 DIAGNOSIS — R41841 Cognitive communication deficit: Secondary | ICD-10-CM | POA: Diagnosis not present

## 2020-03-14 DIAGNOSIS — S42201D Unspecified fracture of upper end of right humerus, subsequent encounter for fracture with routine healing: Secondary | ICD-10-CM | POA: Diagnosis not present

## 2020-03-14 DIAGNOSIS — C3412 Malignant neoplasm of upper lobe, left bronchus or lung: Secondary | ICD-10-CM | POA: Diagnosis not present

## 2020-03-14 DIAGNOSIS — M4626 Osteomyelitis of vertebra, lumbar region: Secondary | ICD-10-CM | POA: Diagnosis not present

## 2020-03-14 DIAGNOSIS — R1310 Dysphagia, unspecified: Secondary | ICD-10-CM | POA: Diagnosis not present

## 2020-03-14 DIAGNOSIS — E1142 Type 2 diabetes mellitus with diabetic polyneuropathy: Secondary | ICD-10-CM | POA: Diagnosis not present

## 2020-03-14 DIAGNOSIS — G3184 Mild cognitive impairment, so stated: Secondary | ICD-10-CM | POA: Diagnosis not present

## 2020-03-14 DIAGNOSIS — D699 Hemorrhagic condition, unspecified: Secondary | ICD-10-CM | POA: Diagnosis not present

## 2020-03-14 DIAGNOSIS — M1811 Unilateral primary osteoarthritis of first carpometacarpal joint, right hand: Secondary | ICD-10-CM | POA: Diagnosis not present

## 2020-03-14 DIAGNOSIS — Z1211 Encounter for screening for malignant neoplasm of colon: Secondary | ICD-10-CM | POA: Diagnosis not present

## 2020-03-14 DIAGNOSIS — R609 Edema, unspecified: Secondary | ICD-10-CM | POA: Diagnosis not present

## 2020-03-14 DIAGNOSIS — N311 Reflex neuropathic bladder, not elsewhere classified: Secondary | ICD-10-CM | POA: Diagnosis not present

## 2020-03-14 DIAGNOSIS — J4522 Mild intermittent asthma with status asthmaticus: Secondary | ICD-10-CM | POA: Diagnosis not present

## 2020-03-14 DIAGNOSIS — E2839 Other primary ovarian failure: Secondary | ICD-10-CM | POA: Diagnosis not present

## 2020-03-14 DIAGNOSIS — H25811 Combined forms of age-related cataract, right eye: Secondary | ICD-10-CM | POA: Diagnosis not present

## 2020-03-14 DIAGNOSIS — M19079 Primary osteoarthritis, unspecified ankle and foot: Secondary | ICD-10-CM | POA: Diagnosis not present

## 2020-03-14 DIAGNOSIS — R1084 Generalized abdominal pain: Secondary | ICD-10-CM | POA: Diagnosis not present

## 2020-03-14 DIAGNOSIS — H25043 Posterior subcapsular polar age-related cataract, bilateral: Secondary | ICD-10-CM | POA: Diagnosis not present

## 2020-03-14 DIAGNOSIS — M47817 Spondylosis without myelopathy or radiculopathy, lumbosacral region: Secondary | ICD-10-CM | POA: Diagnosis not present

## 2020-03-14 DIAGNOSIS — Z87891 Personal history of nicotine dependence: Secondary | ICD-10-CM | POA: Diagnosis not present

## 2020-03-14 DIAGNOSIS — L2084 Intrinsic (allergic) eczema: Secondary | ICD-10-CM | POA: Diagnosis not present

## 2020-03-14 DIAGNOSIS — I779 Disorder of arteries and arterioles, unspecified: Secondary | ICD-10-CM | POA: Diagnosis not present

## 2020-03-14 DIAGNOSIS — H2513 Age-related nuclear cataract, bilateral: Secondary | ICD-10-CM | POA: Diagnosis not present

## 2020-03-14 DIAGNOSIS — E530 Riboflavin deficiency: Secondary | ICD-10-CM | POA: Diagnosis not present

## 2020-03-14 DIAGNOSIS — Z170 Estrogen receptor positive status [ER+]: Secondary | ICD-10-CM | POA: Diagnosis not present

## 2020-03-14 DIAGNOSIS — M9903 Segmental and somatic dysfunction of lumbar region: Secondary | ICD-10-CM | POA: Diagnosis not present

## 2020-03-14 DIAGNOSIS — D539 Nutritional anemia, unspecified: Secondary | ICD-10-CM | POA: Diagnosis not present

## 2020-03-14 DIAGNOSIS — F3177 Bipolar disorder, in partial remission, most recent episode mixed: Secondary | ICD-10-CM | POA: Diagnosis not present

## 2020-03-14 DIAGNOSIS — B20 Human immunodeficiency virus [HIV] disease: Secondary | ICD-10-CM | POA: Diagnosis not present

## 2020-03-14 DIAGNOSIS — Z1589 Genetic susceptibility to other disease: Secondary | ICD-10-CM | POA: Diagnosis not present

## 2020-03-14 DIAGNOSIS — H5213 Myopia, bilateral: Secondary | ICD-10-CM | POA: Diagnosis not present

## 2020-03-14 DIAGNOSIS — I83893 Varicose veins of bilateral lower extremities with other complications: Secondary | ICD-10-CM | POA: Diagnosis not present

## 2020-03-14 DIAGNOSIS — E1121 Type 2 diabetes mellitus with diabetic nephropathy: Secondary | ICD-10-CM | POA: Diagnosis not present

## 2020-03-14 DIAGNOSIS — I341 Nonrheumatic mitral (valve) prolapse: Secondary | ICD-10-CM | POA: Diagnosis not present

## 2020-03-14 DIAGNOSIS — H6690 Otitis media, unspecified, unspecified ear: Secondary | ICD-10-CM | POA: Diagnosis not present

## 2020-03-14 DIAGNOSIS — R600 Localized edema: Secondary | ICD-10-CM | POA: Diagnosis not present

## 2020-03-14 DIAGNOSIS — R801 Persistent proteinuria, unspecified: Secondary | ICD-10-CM | POA: Diagnosis not present

## 2020-03-14 DIAGNOSIS — K31819 Angiodysplasia of stomach and duodenum without bleeding: Secondary | ICD-10-CM | POA: Diagnosis not present

## 2020-03-14 DIAGNOSIS — W19XXXD Unspecified fall, subsequent encounter: Secondary | ICD-10-CM | POA: Diagnosis not present

## 2020-03-14 DIAGNOSIS — Z1212 Encounter for screening for malignant neoplasm of rectum: Secondary | ICD-10-CM | POA: Diagnosis not present

## 2020-03-14 DIAGNOSIS — F5101 Primary insomnia: Secondary | ICD-10-CM | POA: Diagnosis not present

## 2020-03-14 DIAGNOSIS — I469 Cardiac arrest, cause unspecified: Secondary | ICD-10-CM | POA: Diagnosis not present

## 2020-03-14 DIAGNOSIS — Z791 Long term (current) use of non-steroidal anti-inflammatories (NSAID): Secondary | ICD-10-CM | POA: Diagnosis not present

## 2020-03-14 DIAGNOSIS — M8589 Other specified disorders of bone density and structure, multiple sites: Secondary | ICD-10-CM | POA: Diagnosis not present

## 2020-03-14 DIAGNOSIS — M069 Rheumatoid arthritis, unspecified: Secondary | ICD-10-CM | POA: Diagnosis not present

## 2020-03-14 DIAGNOSIS — F1120 Opioid dependence, uncomplicated: Secondary | ICD-10-CM | POA: Diagnosis not present

## 2020-03-14 DIAGNOSIS — I8393 Asymptomatic varicose veins of bilateral lower extremities: Secondary | ICD-10-CM | POA: Diagnosis not present

## 2020-03-14 DIAGNOSIS — M436 Torticollis: Secondary | ICD-10-CM | POA: Diagnosis not present

## 2020-03-14 DIAGNOSIS — H9313 Tinnitus, bilateral: Secondary | ICD-10-CM | POA: Diagnosis not present

## 2020-03-14 DIAGNOSIS — H52203 Unspecified astigmatism, bilateral: Secondary | ICD-10-CM | POA: Diagnosis not present

## 2020-03-14 DIAGNOSIS — Z91018 Allergy to other foods: Secondary | ICD-10-CM | POA: Diagnosis not present

## 2020-03-14 DIAGNOSIS — G959 Disease of spinal cord, unspecified: Secondary | ICD-10-CM | POA: Diagnosis not present

## 2020-03-14 DIAGNOSIS — I951 Orthostatic hypotension: Secondary | ICD-10-CM | POA: Diagnosis not present

## 2020-03-14 DIAGNOSIS — R531 Weakness: Secondary | ICD-10-CM | POA: Diagnosis not present

## 2020-03-14 DIAGNOSIS — M1990 Unspecified osteoarthritis, unspecified site: Secondary | ICD-10-CM | POA: Diagnosis not present

## 2020-03-14 DIAGNOSIS — L300 Nummular dermatitis: Secondary | ICD-10-CM | POA: Diagnosis not present

## 2020-03-14 DIAGNOSIS — H5203 Hypermetropia, bilateral: Secondary | ICD-10-CM | POA: Diagnosis not present

## 2020-03-14 DIAGNOSIS — Z8673 Personal history of transient ischemic attack (TIA), and cerebral infarction without residual deficits: Secondary | ICD-10-CM | POA: Diagnosis not present

## 2020-03-14 DIAGNOSIS — R7302 Impaired glucose tolerance (oral): Secondary | ICD-10-CM | POA: Diagnosis not present

## 2020-03-14 DIAGNOSIS — I5022 Chronic systolic (congestive) heart failure: Secondary | ICD-10-CM | POA: Diagnosis not present

## 2020-03-14 DIAGNOSIS — C8599 Non-Hodgkin lymphoma, unspecified, extranodal and solid organ sites: Secondary | ICD-10-CM | POA: Diagnosis not present

## 2020-03-14 DIAGNOSIS — L281 Prurigo nodularis: Secondary | ICD-10-CM | POA: Diagnosis not present

## 2020-03-14 DIAGNOSIS — F0280 Dementia in other diseases classified elsewhere without behavioral disturbance: Secondary | ICD-10-CM | POA: Diagnosis not present

## 2020-03-14 DIAGNOSIS — L538 Other specified erythematous conditions: Secondary | ICD-10-CM | POA: Diagnosis not present

## 2020-03-14 DIAGNOSIS — Z9989 Dependence on other enabling machines and devices: Secondary | ICD-10-CM | POA: Diagnosis not present

## 2020-03-14 DIAGNOSIS — M1732 Unilateral post-traumatic osteoarthritis, left knee: Secondary | ICD-10-CM | POA: Diagnosis not present

## 2020-03-14 DIAGNOSIS — D6869 Other thrombophilia: Secondary | ICD-10-CM | POA: Diagnosis not present

## 2020-03-14 DIAGNOSIS — F324 Major depressive disorder, single episode, in partial remission: Secondary | ICD-10-CM | POA: Diagnosis not present

## 2020-03-14 DIAGNOSIS — M5126 Other intervertebral disc displacement, lumbar region: Secondary | ICD-10-CM | POA: Diagnosis not present

## 2020-03-14 DIAGNOSIS — R002 Palpitations: Secondary | ICD-10-CM | POA: Diagnosis not present

## 2020-03-14 DIAGNOSIS — G20 Parkinson's disease: Secondary | ICD-10-CM | POA: Diagnosis not present

## 2020-03-14 DIAGNOSIS — M25411 Effusion, right shoulder: Secondary | ICD-10-CM | POA: Diagnosis not present

## 2020-03-14 DIAGNOSIS — M546 Pain in thoracic spine: Secondary | ICD-10-CM | POA: Diagnosis not present

## 2020-03-14 DIAGNOSIS — M150 Primary generalized (osteo)arthritis: Secondary | ICD-10-CM | POA: Diagnosis not present

## 2020-03-14 DIAGNOSIS — I739 Peripheral vascular disease, unspecified: Secondary | ICD-10-CM | POA: Diagnosis not present

## 2020-03-14 DIAGNOSIS — Z45018 Encounter for adjustment and management of other part of cardiac pacemaker: Secondary | ICD-10-CM | POA: Diagnosis not present

## 2020-03-14 DIAGNOSIS — E1129 Type 2 diabetes mellitus with other diabetic kidney complication: Secondary | ICD-10-CM | POA: Diagnosis not present

## 2020-03-14 DIAGNOSIS — N400 Enlarged prostate without lower urinary tract symptoms: Secondary | ICD-10-CM | POA: Diagnosis not present

## 2020-03-14 DIAGNOSIS — I69398 Other sequelae of cerebral infarction: Secondary | ICD-10-CM | POA: Diagnosis not present

## 2020-03-14 DIAGNOSIS — R4182 Altered mental status, unspecified: Secondary | ICD-10-CM | POA: Diagnosis not present

## 2020-03-14 DIAGNOSIS — Z9181 History of falling: Secondary | ICD-10-CM | POA: Diagnosis not present

## 2020-03-14 DIAGNOSIS — D6489 Other specified anemias: Secondary | ICD-10-CM | POA: Diagnosis not present

## 2020-03-14 DIAGNOSIS — H35372 Puckering of macula, left eye: Secondary | ICD-10-CM | POA: Diagnosis not present

## 2020-03-14 DIAGNOSIS — N2889 Other specified disorders of kidney and ureter: Secondary | ICD-10-CM | POA: Diagnosis not present

## 2020-03-14 DIAGNOSIS — C672 Malignant neoplasm of lateral wall of bladder: Secondary | ICD-10-CM | POA: Diagnosis not present

## 2020-03-14 DIAGNOSIS — Z91010 Allergy to peanuts: Secondary | ICD-10-CM | POA: Diagnosis not present

## 2020-03-14 DIAGNOSIS — F3342 Major depressive disorder, recurrent, in full remission: Secondary | ICD-10-CM | POA: Diagnosis not present

## 2020-03-14 DIAGNOSIS — M4726 Other spondylosis with radiculopathy, lumbar region: Secondary | ICD-10-CM | POA: Diagnosis not present

## 2020-03-14 DIAGNOSIS — N183 Chronic kidney disease, stage 3 unspecified: Secondary | ICD-10-CM | POA: Diagnosis not present

## 2020-03-14 DIAGNOSIS — I5043 Acute on chronic combined systolic (congestive) and diastolic (congestive) heart failure: Secondary | ICD-10-CM | POA: Diagnosis not present

## 2020-03-14 DIAGNOSIS — E11621 Type 2 diabetes mellitus with foot ulcer: Secondary | ICD-10-CM | POA: Diagnosis not present

## 2020-03-14 DIAGNOSIS — M25572 Pain in left ankle and joints of left foot: Secondary | ICD-10-CM | POA: Diagnosis not present

## 2020-03-14 DIAGNOSIS — Z95818 Presence of other cardiac implants and grafts: Secondary | ICD-10-CM | POA: Diagnosis not present

## 2020-03-14 DIAGNOSIS — H6121 Impacted cerumen, right ear: Secondary | ICD-10-CM | POA: Diagnosis not present

## 2020-03-14 DIAGNOSIS — H8110 Benign paroxysmal vertigo, unspecified ear: Secondary | ICD-10-CM | POA: Diagnosis not present

## 2020-03-14 DIAGNOSIS — K3189 Other diseases of stomach and duodenum: Secondary | ICD-10-CM | POA: Diagnosis not present

## 2020-03-14 DIAGNOSIS — R55 Syncope and collapse: Secondary | ICD-10-CM | POA: Diagnosis not present

## 2020-03-14 DIAGNOSIS — Z6832 Body mass index (BMI) 32.0-32.9, adult: Secondary | ICD-10-CM | POA: Diagnosis not present

## 2020-03-14 DIAGNOSIS — I5020 Unspecified systolic (congestive) heart failure: Secondary | ICD-10-CM | POA: Diagnosis not present

## 2020-03-14 DIAGNOSIS — S82025K Nondisplaced longitudinal fracture of left patella, subsequent encounter for closed fracture with nonunion: Secondary | ICD-10-CM | POA: Diagnosis not present

## 2020-03-14 DIAGNOSIS — R351 Nocturia: Secondary | ICD-10-CM | POA: Diagnosis not present

## 2020-03-14 DIAGNOSIS — D751 Secondary polycythemia: Secondary | ICD-10-CM | POA: Diagnosis not present

## 2020-03-14 DIAGNOSIS — C220 Liver cell carcinoma: Secondary | ICD-10-CM | POA: Diagnosis not present

## 2020-03-14 DIAGNOSIS — H52222 Regular astigmatism, left eye: Secondary | ICD-10-CM | POA: Diagnosis not present

## 2020-03-14 DIAGNOSIS — C3490 Malignant neoplasm of unspecified part of unspecified bronchus or lung: Secondary | ICD-10-CM | POA: Diagnosis not present

## 2020-03-14 DIAGNOSIS — C8331 Diffuse large B-cell lymphoma, lymph nodes of head, face, and neck: Secondary | ICD-10-CM | POA: Diagnosis not present

## 2020-03-14 DIAGNOSIS — M2550 Pain in unspecified joint: Secondary | ICD-10-CM | POA: Diagnosis not present

## 2020-03-14 DIAGNOSIS — C9000 Multiple myeloma not having achieved remission: Secondary | ICD-10-CM | POA: Diagnosis not present

## 2020-03-14 DIAGNOSIS — R1031 Right lower quadrant pain: Secondary | ICD-10-CM | POA: Diagnosis not present

## 2020-03-14 DIAGNOSIS — Z8052 Family history of malignant neoplasm of bladder: Secondary | ICD-10-CM | POA: Diagnosis not present

## 2020-03-14 DIAGNOSIS — M216X2 Other acquired deformities of left foot: Secondary | ICD-10-CM | POA: Diagnosis not present

## 2020-03-14 DIAGNOSIS — I6340 Cerebral infarction due to embolism of unspecified cerebral artery: Secondary | ICD-10-CM | POA: Diagnosis not present

## 2020-03-14 DIAGNOSIS — L97221 Non-pressure chronic ulcer of left calf limited to breakdown of skin: Secondary | ICD-10-CM | POA: Diagnosis not present

## 2020-03-14 DIAGNOSIS — K838 Other specified diseases of biliary tract: Secondary | ICD-10-CM | POA: Diagnosis not present

## 2020-03-14 DIAGNOSIS — I471 Supraventricular tachycardia: Secondary | ICD-10-CM | POA: Diagnosis not present

## 2020-03-14 DIAGNOSIS — G9009 Other idiopathic peripheral autonomic neuropathy: Secondary | ICD-10-CM | POA: Diagnosis not present

## 2020-03-14 DIAGNOSIS — L280 Lichen simplex chronicus: Secondary | ICD-10-CM | POA: Diagnosis not present

## 2020-03-14 DIAGNOSIS — R1010 Upper abdominal pain, unspecified: Secondary | ICD-10-CM | POA: Diagnosis not present

## 2020-03-14 DIAGNOSIS — D631 Anemia in chronic kidney disease: Secondary | ICD-10-CM | POA: Diagnosis not present

## 2020-03-14 DIAGNOSIS — C44319 Basal cell carcinoma of skin of other parts of face: Secondary | ICD-10-CM | POA: Diagnosis not present

## 2020-03-14 DIAGNOSIS — C7951 Secondary malignant neoplasm of bone: Secondary | ICD-10-CM | POA: Diagnosis not present

## 2020-03-14 DIAGNOSIS — E1140 Type 2 diabetes mellitus with diabetic neuropathy, unspecified: Secondary | ICD-10-CM | POA: Diagnosis not present

## 2020-03-14 DIAGNOSIS — G500 Trigeminal neuralgia: Secondary | ICD-10-CM | POA: Diagnosis not present

## 2020-03-14 DIAGNOSIS — E1151 Type 2 diabetes mellitus with diabetic peripheral angiopathy without gangrene: Secondary | ICD-10-CM | POA: Diagnosis not present

## 2020-03-14 DIAGNOSIS — S6992XA Unspecified injury of left wrist, hand and finger(s), initial encounter: Secondary | ICD-10-CM | POA: Diagnosis not present

## 2020-03-14 DIAGNOSIS — F419 Anxiety disorder, unspecified: Secondary | ICD-10-CM | POA: Diagnosis not present

## 2020-03-14 DIAGNOSIS — C4922 Malignant neoplasm of connective and soft tissue of left lower limb, including hip: Secondary | ICD-10-CM | POA: Diagnosis not present

## 2020-03-14 DIAGNOSIS — D123 Benign neoplasm of transverse colon: Secondary | ICD-10-CM | POA: Diagnosis not present

## 2020-03-14 DIAGNOSIS — I2510 Atherosclerotic heart disease of native coronary artery without angina pectoris: Secondary | ICD-10-CM | POA: Diagnosis not present

## 2020-03-14 DIAGNOSIS — Z96612 Presence of left artificial shoulder joint: Secondary | ICD-10-CM | POA: Diagnosis not present

## 2020-03-14 DIAGNOSIS — M25461 Effusion, right knee: Secondary | ICD-10-CM | POA: Diagnosis not present

## 2020-03-14 DIAGNOSIS — B009 Herpesviral infection, unspecified: Secondary | ICD-10-CM | POA: Diagnosis not present

## 2020-03-14 DIAGNOSIS — N3941 Urge incontinence: Secondary | ICD-10-CM | POA: Diagnosis not present

## 2020-03-14 DIAGNOSIS — E663 Overweight: Secondary | ICD-10-CM | POA: Diagnosis not present

## 2020-03-14 DIAGNOSIS — R748 Abnormal levels of other serum enzymes: Secondary | ICD-10-CM | POA: Diagnosis not present

## 2020-03-14 DIAGNOSIS — Z0001 Encounter for general adult medical examination with abnormal findings: Secondary | ICD-10-CM | POA: Diagnosis not present

## 2020-03-14 DIAGNOSIS — Z01810 Encounter for preprocedural cardiovascular examination: Secondary | ICD-10-CM | POA: Diagnosis not present

## 2020-03-14 DIAGNOSIS — I77811 Abdominal aortic ectasia: Secondary | ICD-10-CM | POA: Diagnosis not present

## 2020-03-14 DIAGNOSIS — R251 Tremor, unspecified: Secondary | ICD-10-CM | POA: Diagnosis not present

## 2020-03-14 DIAGNOSIS — H2512 Age-related nuclear cataract, left eye: Secondary | ICD-10-CM | POA: Diagnosis not present

## 2020-03-14 DIAGNOSIS — R933 Abnormal findings on diagnostic imaging of other parts of digestive tract: Secondary | ICD-10-CM | POA: Diagnosis not present

## 2020-03-14 DIAGNOSIS — R0789 Other chest pain: Secondary | ICD-10-CM | POA: Diagnosis not present

## 2020-03-14 DIAGNOSIS — Z800 Family history of malignant neoplasm of digestive organs: Secondary | ICD-10-CM | POA: Diagnosis not present

## 2020-03-14 DIAGNOSIS — C50812 Malignant neoplasm of overlapping sites of left female breast: Secondary | ICD-10-CM | POA: Diagnosis not present

## 2020-03-14 DIAGNOSIS — M75122 Complete rotator cuff tear or rupture of left shoulder, not specified as traumatic: Secondary | ICD-10-CM | POA: Diagnosis not present

## 2020-03-14 DIAGNOSIS — M65341 Trigger finger, right ring finger: Secondary | ICD-10-CM | POA: Diagnosis not present

## 2020-03-14 DIAGNOSIS — H25042 Posterior subcapsular polar age-related cataract, left eye: Secondary | ICD-10-CM | POA: Diagnosis not present

## 2020-03-14 DIAGNOSIS — R410 Disorientation, unspecified: Secondary | ICD-10-CM | POA: Diagnosis not present

## 2020-03-14 DIAGNOSIS — I82402 Acute embolism and thrombosis of unspecified deep veins of left lower extremity: Secondary | ICD-10-CM | POA: Diagnosis not present

## 2020-03-14 DIAGNOSIS — R7401 Elevation of levels of liver transaminase levels: Secondary | ICD-10-CM | POA: Diagnosis not present

## 2020-03-14 DIAGNOSIS — Z8639 Personal history of other endocrine, nutritional and metabolic disease: Secondary | ICD-10-CM | POA: Diagnosis not present

## 2020-03-14 DIAGNOSIS — M25762 Osteophyte, left knee: Secondary | ICD-10-CM | POA: Diagnosis not present

## 2020-03-14 DIAGNOSIS — Z5112 Encounter for antineoplastic immunotherapy: Secondary | ICD-10-CM | POA: Diagnosis not present

## 2020-03-14 DIAGNOSIS — M1731 Unilateral post-traumatic osteoarthritis, right knee: Secondary | ICD-10-CM | POA: Diagnosis not present

## 2020-03-14 DIAGNOSIS — C8300 Small cell B-cell lymphoma, unspecified site: Secondary | ICD-10-CM | POA: Diagnosis not present

## 2020-03-14 DIAGNOSIS — H905 Unspecified sensorineural hearing loss: Secondary | ICD-10-CM | POA: Diagnosis not present

## 2020-03-14 DIAGNOSIS — D680 Von Willebrand's disease: Secondary | ICD-10-CM | POA: Diagnosis not present

## 2020-03-14 DIAGNOSIS — C9110 Chronic lymphocytic leukemia of B-cell type not having achieved remission: Secondary | ICD-10-CM | POA: Diagnosis not present

## 2020-03-14 DIAGNOSIS — Z880 Allergy status to penicillin: Secondary | ICD-10-CM | POA: Diagnosis not present

## 2020-03-14 DIAGNOSIS — L570 Actinic keratosis: Secondary | ICD-10-CM | POA: Diagnosis not present

## 2020-03-14 DIAGNOSIS — S134XXA Sprain of ligaments of cervical spine, initial encounter: Secondary | ICD-10-CM | POA: Diagnosis not present

## 2020-03-14 DIAGNOSIS — R350 Frequency of micturition: Secondary | ICD-10-CM | POA: Diagnosis not present

## 2020-03-14 DIAGNOSIS — J45909 Unspecified asthma, uncomplicated: Secondary | ICD-10-CM | POA: Diagnosis not present

## 2020-03-14 DIAGNOSIS — H401131 Primary open-angle glaucoma, bilateral, mild stage: Secondary | ICD-10-CM | POA: Diagnosis not present

## 2020-03-14 DIAGNOSIS — H26493 Other secondary cataract, bilateral: Secondary | ICD-10-CM | POA: Diagnosis not present

## 2020-03-14 DIAGNOSIS — Z1389 Encounter for screening for other disorder: Secondary | ICD-10-CM | POA: Diagnosis not present

## 2020-03-14 DIAGNOSIS — N138 Other obstructive and reflux uropathy: Secondary | ICD-10-CM | POA: Diagnosis not present

## 2020-03-14 DIAGNOSIS — K5900 Constipation, unspecified: Secondary | ICD-10-CM | POA: Diagnosis not present

## 2020-03-14 DIAGNOSIS — N2581 Secondary hyperparathyroidism of renal origin: Secondary | ICD-10-CM | POA: Diagnosis not present

## 2020-03-14 DIAGNOSIS — E113412 Type 2 diabetes mellitus with severe nonproliferative diabetic retinopathy with macular edema, left eye: Secondary | ICD-10-CM | POA: Diagnosis not present

## 2020-03-14 DIAGNOSIS — Z803 Family history of malignant neoplasm of breast: Secondary | ICD-10-CM | POA: Diagnosis not present

## 2020-03-14 DIAGNOSIS — M79675 Pain in left toe(s): Secondary | ICD-10-CM | POA: Diagnosis not present

## 2020-03-14 DIAGNOSIS — K319 Disease of stomach and duodenum, unspecified: Secondary | ICD-10-CM | POA: Diagnosis not present

## 2020-03-14 DIAGNOSIS — R454 Irritability and anger: Secondary | ICD-10-CM | POA: Diagnosis not present

## 2020-03-14 DIAGNOSIS — R5381 Other malaise: Secondary | ICD-10-CM | POA: Diagnosis not present

## 2020-03-14 DIAGNOSIS — E781 Pure hyperglyceridemia: Secondary | ICD-10-CM | POA: Diagnosis not present

## 2020-03-14 DIAGNOSIS — C673 Malignant neoplasm of anterior wall of bladder: Secondary | ICD-10-CM | POA: Diagnosis not present

## 2020-03-14 DIAGNOSIS — M79672 Pain in left foot: Secondary | ICD-10-CM | POA: Diagnosis not present

## 2020-03-14 DIAGNOSIS — R1312 Dysphagia, oropharyngeal phase: Secondary | ICD-10-CM | POA: Diagnosis not present

## 2020-03-14 DIAGNOSIS — H1789 Other corneal scars and opacities: Secondary | ICD-10-CM | POA: Diagnosis not present

## 2020-03-14 DIAGNOSIS — K631 Perforation of intestine (nontraumatic): Secondary | ICD-10-CM | POA: Diagnosis not present

## 2020-03-14 DIAGNOSIS — I130 Hypertensive heart and chronic kidney disease with heart failure and stage 1 through stage 4 chronic kidney disease, or unspecified chronic kidney disease: Secondary | ICD-10-CM | POA: Diagnosis not present

## 2020-03-14 DIAGNOSIS — F410 Panic disorder [episodic paroxysmal anxiety] without agoraphobia: Secondary | ICD-10-CM | POA: Diagnosis not present

## 2020-03-14 DIAGNOSIS — H409 Unspecified glaucoma: Secondary | ICD-10-CM | POA: Diagnosis not present

## 2020-03-14 DIAGNOSIS — R102 Pelvic and perineal pain: Secondary | ICD-10-CM | POA: Diagnosis not present

## 2020-03-14 DIAGNOSIS — K7290 Hepatic failure, unspecified without coma: Secondary | ICD-10-CM | POA: Diagnosis not present

## 2020-03-14 DIAGNOSIS — Z01419 Encounter for gynecological examination (general) (routine) without abnormal findings: Secondary | ICD-10-CM | POA: Diagnosis not present

## 2020-03-14 DIAGNOSIS — R519 Headache, unspecified: Secondary | ICD-10-CM | POA: Diagnosis not present

## 2020-03-14 DIAGNOSIS — M8448XD Pathological fracture, other site, subsequent encounter for fracture with routine healing: Secondary | ICD-10-CM | POA: Diagnosis not present

## 2020-03-14 DIAGNOSIS — E11311 Type 2 diabetes mellitus with unspecified diabetic retinopathy with macular edema: Secondary | ICD-10-CM | POA: Diagnosis not present

## 2020-03-14 DIAGNOSIS — H35722 Serous detachment of retinal pigment epithelium, left eye: Secondary | ICD-10-CM | POA: Diagnosis not present

## 2020-03-14 DIAGNOSIS — D1803 Hemangioma of intra-abdominal structures: Secondary | ICD-10-CM | POA: Diagnosis not present

## 2020-03-14 DIAGNOSIS — C50412 Malignant neoplasm of upper-outer quadrant of left female breast: Secondary | ICD-10-CM | POA: Diagnosis not present

## 2020-03-14 DIAGNOSIS — Z833 Family history of diabetes mellitus: Secondary | ICD-10-CM | POA: Diagnosis not present

## 2020-03-14 DIAGNOSIS — H00025 Hordeolum internum left lower eyelid: Secondary | ICD-10-CM | POA: Diagnosis not present

## 2020-03-14 DIAGNOSIS — Z96652 Presence of left artificial knee joint: Secondary | ICD-10-CM | POA: Diagnosis not present

## 2020-03-14 DIAGNOSIS — H34832 Tributary (branch) retinal vein occlusion, left eye, with macular edema: Secondary | ICD-10-CM | POA: Diagnosis not present

## 2020-03-14 DIAGNOSIS — M86171 Other acute osteomyelitis, right ankle and foot: Secondary | ICD-10-CM | POA: Diagnosis not present

## 2020-03-14 DIAGNOSIS — F321 Major depressive disorder, single episode, moderate: Secondary | ICD-10-CM | POA: Diagnosis not present

## 2020-03-14 DIAGNOSIS — Z9861 Coronary angioplasty status: Secondary | ICD-10-CM | POA: Diagnosis not present

## 2020-03-14 DIAGNOSIS — D692 Other nonthrombocytopenic purpura: Secondary | ICD-10-CM | POA: Diagnosis not present

## 2020-03-14 DIAGNOSIS — M79606 Pain in leg, unspecified: Secondary | ICD-10-CM | POA: Diagnosis not present

## 2020-03-14 DIAGNOSIS — H348312 Tributary (branch) retinal vein occlusion, right eye, stable: Secondary | ICD-10-CM | POA: Diagnosis not present

## 2020-03-14 DIAGNOSIS — R1030 Lower abdominal pain, unspecified: Secondary | ICD-10-CM | POA: Diagnosis not present

## 2020-03-14 DIAGNOSIS — F640 Transsexualism: Secondary | ICD-10-CM | POA: Diagnosis not present

## 2020-03-14 DIAGNOSIS — J90 Pleural effusion, not elsewhere classified: Secondary | ICD-10-CM | POA: Diagnosis not present

## 2020-03-14 DIAGNOSIS — H00011 Hordeolum externum right upper eyelid: Secondary | ICD-10-CM | POA: Diagnosis not present

## 2020-03-14 DIAGNOSIS — M5441 Lumbago with sciatica, right side: Secondary | ICD-10-CM | POA: Diagnosis not present

## 2020-03-14 DIAGNOSIS — M2011 Hallux valgus (acquired), right foot: Secondary | ICD-10-CM | POA: Diagnosis not present

## 2020-03-14 DIAGNOSIS — Z952 Presence of prosthetic heart valve: Secondary | ICD-10-CM | POA: Diagnosis not present

## 2020-03-14 DIAGNOSIS — S8000XA Contusion of unspecified knee, initial encounter: Secondary | ICD-10-CM | POA: Diagnosis not present

## 2020-03-14 DIAGNOSIS — Z124 Encounter for screening for malignant neoplasm of cervix: Secondary | ICD-10-CM | POA: Diagnosis not present

## 2020-03-14 DIAGNOSIS — R2689 Other abnormalities of gait and mobility: Secondary | ICD-10-CM | POA: Diagnosis not present

## 2020-03-14 DIAGNOSIS — Z961 Presence of intraocular lens: Secondary | ICD-10-CM | POA: Diagnosis not present

## 2020-03-14 DIAGNOSIS — D2262 Melanocytic nevi of left upper limb, including shoulder: Secondary | ICD-10-CM | POA: Diagnosis not present

## 2020-03-14 DIAGNOSIS — K449 Diaphragmatic hernia without obstruction or gangrene: Secondary | ICD-10-CM | POA: Diagnosis not present

## 2020-03-14 DIAGNOSIS — L97812 Non-pressure chronic ulcer of other part of right lower leg with fat layer exposed: Secondary | ICD-10-CM | POA: Diagnosis not present

## 2020-03-14 DIAGNOSIS — G8114 Spastic hemiplegia affecting left nondominant side: Secondary | ICD-10-CM | POA: Diagnosis not present

## 2020-03-14 DIAGNOSIS — K869 Disease of pancreas, unspecified: Secondary | ICD-10-CM | POA: Diagnosis not present

## 2020-03-14 DIAGNOSIS — Z9109 Other allergy status, other than to drugs and biological substances: Secondary | ICD-10-CM | POA: Diagnosis not present

## 2020-03-14 DIAGNOSIS — E11319 Type 2 diabetes mellitus with unspecified diabetic retinopathy without macular edema: Secondary | ICD-10-CM | POA: Diagnosis not present

## 2020-03-14 DIAGNOSIS — Z806 Family history of leukemia: Secondary | ICD-10-CM | POA: Diagnosis not present

## 2020-03-14 DIAGNOSIS — G629 Polyneuropathy, unspecified: Secondary | ICD-10-CM | POA: Diagnosis not present

## 2020-03-14 DIAGNOSIS — I350 Nonrheumatic aortic (valve) stenosis: Secondary | ICD-10-CM | POA: Diagnosis not present

## 2020-03-14 DIAGNOSIS — I712 Thoracic aortic aneurysm, without rupture: Secondary | ICD-10-CM | POA: Diagnosis not present

## 2020-03-14 DIAGNOSIS — J8410 Pulmonary fibrosis, unspecified: Secondary | ICD-10-CM | POA: Diagnosis not present

## 2020-03-14 DIAGNOSIS — E1169 Type 2 diabetes mellitus with other specified complication: Secondary | ICD-10-CM | POA: Diagnosis not present

## 2020-03-14 DIAGNOSIS — K5901 Slow transit constipation: Secondary | ICD-10-CM | POA: Diagnosis not present

## 2020-03-14 DIAGNOSIS — I83009 Varicose veins of unspecified lower extremity with ulcer of unspecified site: Secondary | ICD-10-CM | POA: Diagnosis not present

## 2020-03-14 DIAGNOSIS — H3561 Retinal hemorrhage, right eye: Secondary | ICD-10-CM | POA: Diagnosis not present

## 2020-03-14 DIAGNOSIS — T85192S Other mechanical complication of implanted electronic neurostimulator (electrode) of spinal cord, sequela: Secondary | ICD-10-CM | POA: Diagnosis not present

## 2020-03-14 DIAGNOSIS — I80209 Phlebitis and thrombophlebitis of unspecified deep vessels of unspecified lower extremity: Secondary | ICD-10-CM | POA: Diagnosis not present

## 2020-03-14 DIAGNOSIS — R0981 Nasal congestion: Secondary | ICD-10-CM | POA: Diagnosis not present

## 2020-03-14 DIAGNOSIS — M21612 Bunion of left foot: Secondary | ICD-10-CM | POA: Diagnosis not present

## 2020-03-14 DIAGNOSIS — D61818 Other pancytopenia: Secondary | ICD-10-CM | POA: Diagnosis not present

## 2020-03-14 DIAGNOSIS — D3001 Benign neoplasm of right kidney: Secondary | ICD-10-CM | POA: Diagnosis not present

## 2020-03-14 DIAGNOSIS — M064 Inflammatory polyarthropathy: Secondary | ICD-10-CM | POA: Diagnosis not present

## 2020-03-14 DIAGNOSIS — R432 Parageusia: Secondary | ICD-10-CM | POA: Diagnosis not present

## 2020-03-14 DIAGNOSIS — H1010 Acute atopic conjunctivitis, unspecified eye: Secondary | ICD-10-CM | POA: Diagnosis not present

## 2020-03-14 DIAGNOSIS — G4736 Sleep related hypoventilation in conditions classified elsewhere: Secondary | ICD-10-CM | POA: Diagnosis not present

## 2020-03-14 DIAGNOSIS — R6882 Decreased libido: Secondary | ICD-10-CM | POA: Diagnosis not present

## 2020-03-14 DIAGNOSIS — M9904 Segmental and somatic dysfunction of sacral region: Secondary | ICD-10-CM | POA: Diagnosis not present

## 2020-03-14 DIAGNOSIS — C44519 Basal cell carcinoma of skin of other part of trunk: Secondary | ICD-10-CM | POA: Diagnosis not present

## 2020-03-14 DIAGNOSIS — R310 Gross hematuria: Secondary | ICD-10-CM | POA: Diagnosis not present

## 2020-03-14 DIAGNOSIS — Z6841 Body Mass Index (BMI) 40.0 and over, adult: Secondary | ICD-10-CM | POA: Diagnosis not present

## 2020-03-14 DIAGNOSIS — L400 Psoriasis vulgaris: Secondary | ICD-10-CM | POA: Diagnosis not present

## 2020-03-14 DIAGNOSIS — Z823 Family history of stroke: Secondary | ICD-10-CM | POA: Diagnosis not present

## 2020-03-14 DIAGNOSIS — M7122 Synovial cyst of popliteal space [Baker], left knee: Secondary | ICD-10-CM | POA: Diagnosis not present

## 2020-03-14 DIAGNOSIS — Z96642 Presence of left artificial hip joint: Secondary | ICD-10-CM | POA: Diagnosis not present

## 2020-03-14 DIAGNOSIS — K769 Liver disease, unspecified: Secondary | ICD-10-CM | POA: Diagnosis not present

## 2020-03-14 DIAGNOSIS — Z86711 Personal history of pulmonary embolism: Secondary | ICD-10-CM | POA: Diagnosis not present

## 2020-03-14 DIAGNOSIS — M25641 Stiffness of right hand, not elsewhere classified: Secondary | ICD-10-CM | POA: Diagnosis not present

## 2020-03-14 DIAGNOSIS — M8000XS Age-related osteoporosis with current pathological fracture, unspecified site, sequela: Secondary | ICD-10-CM | POA: Diagnosis not present

## 2020-03-14 DIAGNOSIS — E860 Dehydration: Secondary | ICD-10-CM | POA: Diagnosis not present

## 2020-03-14 DIAGNOSIS — J189 Pneumonia, unspecified organism: Secondary | ICD-10-CM | POA: Diagnosis not present

## 2020-03-14 DIAGNOSIS — R0600 Dyspnea, unspecified: Secondary | ICD-10-CM | POA: Diagnosis not present

## 2020-03-14 DIAGNOSIS — M9902 Segmental and somatic dysfunction of thoracic region: Secondary | ICD-10-CM | POA: Diagnosis not present

## 2020-03-14 DIAGNOSIS — R3129 Other microscopic hematuria: Secondary | ICD-10-CM | POA: Diagnosis not present

## 2020-03-14 DIAGNOSIS — E7800 Pure hypercholesterolemia, unspecified: Secondary | ICD-10-CM | POA: Diagnosis not present

## 2020-03-14 DIAGNOSIS — J181 Lobar pneumonia, unspecified organism: Secondary | ICD-10-CM | POA: Diagnosis not present

## 2020-03-14 DIAGNOSIS — M545 Low back pain: Secondary | ICD-10-CM | POA: Diagnosis not present

## 2020-03-14 DIAGNOSIS — M5114 Intervertebral disc disorders with radiculopathy, thoracic region: Secondary | ICD-10-CM | POA: Diagnosis not present

## 2020-03-14 DIAGNOSIS — F0151 Vascular dementia with behavioral disturbance: Secondary | ICD-10-CM | POA: Diagnosis not present

## 2020-03-14 DIAGNOSIS — L0100 Impetigo, unspecified: Secondary | ICD-10-CM | POA: Diagnosis not present

## 2020-03-14 DIAGNOSIS — R3121 Asymptomatic microscopic hematuria: Secondary | ICD-10-CM | POA: Diagnosis not present

## 2020-03-14 DIAGNOSIS — J9620 Acute and chronic respiratory failure, unspecified whether with hypoxia or hypercapnia: Secondary | ICD-10-CM | POA: Diagnosis not present

## 2020-03-14 DIAGNOSIS — Z9689 Presence of other specified functional implants: Secondary | ICD-10-CM | POA: Diagnosis not present

## 2020-03-14 DIAGNOSIS — E1042 Type 1 diabetes mellitus with diabetic polyneuropathy: Secondary | ICD-10-CM | POA: Diagnosis not present

## 2020-03-14 DIAGNOSIS — M75121 Complete rotator cuff tear or rupture of right shoulder, not specified as traumatic: Secondary | ICD-10-CM | POA: Diagnosis not present

## 2020-03-14 DIAGNOSIS — R430 Anosmia: Secondary | ICD-10-CM | POA: Diagnosis not present

## 2020-03-14 DIAGNOSIS — M791 Myalgia, unspecified site: Secondary | ICD-10-CM | POA: Diagnosis not present

## 2020-03-14 DIAGNOSIS — Z950 Presence of cardiac pacemaker: Secondary | ICD-10-CM | POA: Diagnosis not present

## 2020-03-14 DIAGNOSIS — L989 Disorder of the skin and subcutaneous tissue, unspecified: Secondary | ICD-10-CM | POA: Diagnosis not present

## 2020-03-14 DIAGNOSIS — F39 Unspecified mood [affective] disorder: Secondary | ICD-10-CM | POA: Diagnosis not present

## 2020-03-14 DIAGNOSIS — I252 Old myocardial infarction: Secondary | ICD-10-CM | POA: Diagnosis not present

## 2020-03-14 DIAGNOSIS — I4510 Unspecified right bundle-branch block: Secondary | ICD-10-CM | POA: Diagnosis not present

## 2020-03-14 DIAGNOSIS — K529 Noninfective gastroenteritis and colitis, unspecified: Secondary | ICD-10-CM | POA: Diagnosis not present

## 2020-03-14 DIAGNOSIS — R6520 Severe sepsis without septic shock: Secondary | ICD-10-CM | POA: Diagnosis not present

## 2020-03-14 DIAGNOSIS — H268 Other specified cataract: Secondary | ICD-10-CM | POA: Diagnosis not present

## 2020-03-14 DIAGNOSIS — M4186 Other forms of scoliosis, lumbar region: Secondary | ICD-10-CM | POA: Diagnosis not present

## 2020-03-14 DIAGNOSIS — E875 Hyperkalemia: Secondary | ICD-10-CM | POA: Diagnosis not present

## 2020-03-14 DIAGNOSIS — Z6824 Body mass index (BMI) 24.0-24.9, adult: Secondary | ICD-10-CM | POA: Diagnosis not present

## 2020-03-14 DIAGNOSIS — R300 Dysuria: Secondary | ICD-10-CM | POA: Diagnosis not present

## 2020-03-14 DIAGNOSIS — I4811 Longstanding persistent atrial fibrillation: Secondary | ICD-10-CM | POA: Diagnosis not present

## 2020-03-14 DIAGNOSIS — K143 Hypertrophy of tongue papillae: Secondary | ICD-10-CM | POA: Diagnosis not present

## 2020-03-14 DIAGNOSIS — R4781 Slurred speech: Secondary | ICD-10-CM | POA: Diagnosis not present

## 2020-03-14 DIAGNOSIS — R293 Abnormal posture: Secondary | ICD-10-CM | POA: Diagnosis not present

## 2020-03-14 DIAGNOSIS — M8580 Other specified disorders of bone density and structure, unspecified site: Secondary | ICD-10-CM | POA: Diagnosis not present

## 2020-03-14 DIAGNOSIS — L578 Other skin changes due to chronic exposure to nonionizing radiation: Secondary | ICD-10-CM | POA: Diagnosis not present

## 2020-03-14 DIAGNOSIS — H6122 Impacted cerumen, left ear: Secondary | ICD-10-CM | POA: Diagnosis not present

## 2020-03-14 DIAGNOSIS — H401112 Primary open-angle glaucoma, right eye, moderate stage: Secondary | ICD-10-CM | POA: Diagnosis not present

## 2020-03-14 DIAGNOSIS — K635 Polyp of colon: Secondary | ICD-10-CM | POA: Diagnosis not present

## 2020-03-14 DIAGNOSIS — Z881 Allergy status to other antibiotic agents status: Secondary | ICD-10-CM | POA: Diagnosis not present

## 2020-03-14 DIAGNOSIS — E063 Autoimmune thyroiditis: Secondary | ICD-10-CM | POA: Diagnosis not present

## 2020-03-14 DIAGNOSIS — R8281 Pyuria: Secondary | ICD-10-CM | POA: Diagnosis not present

## 2020-03-14 DIAGNOSIS — D23111 Other benign neoplasm of skin of right upper eyelid, including canthus: Secondary | ICD-10-CM | POA: Diagnosis not present

## 2020-03-14 DIAGNOSIS — M1712 Unilateral primary osteoarthritis, left knee: Secondary | ICD-10-CM | POA: Diagnosis not present

## 2020-03-14 DIAGNOSIS — F17209 Nicotine dependence, unspecified, with unspecified nicotine-induced disorders: Secondary | ICD-10-CM | POA: Diagnosis not present

## 2020-03-14 DIAGNOSIS — L84 Corns and callosities: Secondary | ICD-10-CM | POA: Diagnosis not present

## 2020-03-14 DIAGNOSIS — C679 Malignant neoplasm of bladder, unspecified: Secondary | ICD-10-CM | POA: Diagnosis not present

## 2020-03-14 DIAGNOSIS — M3501 Sicca syndrome with keratoconjunctivitis: Secondary | ICD-10-CM | POA: Diagnosis not present

## 2020-03-14 DIAGNOSIS — G4700 Insomnia, unspecified: Secondary | ICD-10-CM | POA: Diagnosis not present

## 2020-03-14 DIAGNOSIS — L03031 Cellulitis of right toe: Secondary | ICD-10-CM | POA: Diagnosis not present

## 2020-03-14 DIAGNOSIS — Z951 Presence of aortocoronary bypass graft: Secondary | ICD-10-CM | POA: Diagnosis not present

## 2020-03-14 DIAGNOSIS — H3093 Unspecified chorioretinal inflammation, bilateral: Secondary | ICD-10-CM | POA: Diagnosis not present

## 2020-03-14 DIAGNOSIS — H5201 Hypermetropia, right eye: Secondary | ICD-10-CM | POA: Diagnosis not present

## 2020-03-14 DIAGNOSIS — C801 Malignant (primary) neoplasm, unspecified: Secondary | ICD-10-CM | POA: Diagnosis not present

## 2020-03-14 DIAGNOSIS — M25631 Stiffness of right wrist, not elsewhere classified: Secondary | ICD-10-CM | POA: Diagnosis not present

## 2020-03-14 DIAGNOSIS — G894 Chronic pain syndrome: Secondary | ICD-10-CM | POA: Diagnosis not present

## 2020-03-14 DIAGNOSIS — B029 Zoster without complications: Secondary | ICD-10-CM | POA: Diagnosis not present

## 2020-03-14 DIAGNOSIS — F309 Manic episode, unspecified: Secondary | ICD-10-CM | POA: Diagnosis not present

## 2020-03-14 DIAGNOSIS — D2261 Melanocytic nevi of right upper limb, including shoulder: Secondary | ICD-10-CM | POA: Diagnosis not present

## 2020-03-14 DIAGNOSIS — E113299 Type 2 diabetes mellitus with mild nonproliferative diabetic retinopathy without macular edema, unspecified eye: Secondary | ICD-10-CM | POA: Diagnosis not present

## 2020-03-14 DIAGNOSIS — M5136 Other intervertebral disc degeneration, lumbar region: Secondary | ICD-10-CM | POA: Diagnosis not present

## 2020-03-14 DIAGNOSIS — H353221 Exudative age-related macular degeneration, left eye, with active choroidal neovascularization: Secondary | ICD-10-CM | POA: Diagnosis not present

## 2020-03-14 DIAGNOSIS — R443 Hallucinations, unspecified: Secondary | ICD-10-CM | POA: Diagnosis not present

## 2020-03-14 DIAGNOSIS — M25431 Effusion, right wrist: Secondary | ICD-10-CM | POA: Diagnosis not present

## 2020-03-14 DIAGNOSIS — I255 Ischemic cardiomyopathy: Secondary | ICD-10-CM | POA: Diagnosis not present

## 2020-03-14 DIAGNOSIS — H5310 Unspecified subjective visual disturbances: Secondary | ICD-10-CM | POA: Diagnosis not present

## 2020-03-14 DIAGNOSIS — R197 Diarrhea, unspecified: Secondary | ICD-10-CM | POA: Diagnosis not present

## 2020-03-14 DIAGNOSIS — M179 Osteoarthritis of knee, unspecified: Secondary | ICD-10-CM | POA: Diagnosis not present

## 2020-03-14 DIAGNOSIS — M25531 Pain in right wrist: Secondary | ICD-10-CM | POA: Diagnosis not present

## 2020-03-14 DIAGNOSIS — M4310 Spondylolisthesis, site unspecified: Secondary | ICD-10-CM | POA: Diagnosis not present

## 2020-03-14 DIAGNOSIS — D2271 Melanocytic nevi of right lower limb, including hip: Secondary | ICD-10-CM | POA: Diagnosis not present

## 2020-03-14 DIAGNOSIS — Z9889 Other specified postprocedural states: Secondary | ICD-10-CM | POA: Diagnosis not present

## 2020-03-14 DIAGNOSIS — L819 Disorder of pigmentation, unspecified: Secondary | ICD-10-CM | POA: Diagnosis not present

## 2020-03-14 DIAGNOSIS — M8588 Other specified disorders of bone density and structure, other site: Secondary | ICD-10-CM | POA: Diagnosis not present

## 2020-03-14 DIAGNOSIS — F319 Bipolar disorder, unspecified: Secondary | ICD-10-CM | POA: Diagnosis not present

## 2020-03-14 DIAGNOSIS — G5602 Carpal tunnel syndrome, left upper limb: Secondary | ICD-10-CM | POA: Diagnosis not present

## 2020-03-14 DIAGNOSIS — I679 Cerebrovascular disease, unspecified: Secondary | ICD-10-CM | POA: Diagnosis not present

## 2020-03-14 DIAGNOSIS — L97522 Non-pressure chronic ulcer of other part of left foot with fat layer exposed: Secondary | ICD-10-CM | POA: Diagnosis not present

## 2020-03-14 DIAGNOSIS — N390 Urinary tract infection, site not specified: Secondary | ICD-10-CM | POA: Diagnosis not present

## 2020-03-14 DIAGNOSIS — I63013 Cerebral infarction due to thrombosis of bilateral vertebral arteries: Secondary | ICD-10-CM | POA: Diagnosis not present

## 2020-03-14 DIAGNOSIS — L659 Nonscarring hair loss, unspecified: Secondary | ICD-10-CM | POA: Diagnosis not present

## 2020-03-14 DIAGNOSIS — R000 Tachycardia, unspecified: Secondary | ICD-10-CM | POA: Diagnosis not present

## 2020-03-14 DIAGNOSIS — L4050 Arthropathic psoriasis, unspecified: Secondary | ICD-10-CM | POA: Diagnosis not present

## 2020-03-14 DIAGNOSIS — F310 Bipolar disorder, current episode hypomanic: Secondary | ICD-10-CM | POA: Diagnosis not present

## 2020-03-14 DIAGNOSIS — Z9049 Acquired absence of other specified parts of digestive tract: Secondary | ICD-10-CM | POA: Diagnosis not present

## 2020-03-14 DIAGNOSIS — N3946 Mixed incontinence: Secondary | ICD-10-CM | POA: Diagnosis not present

## 2020-03-14 DIAGNOSIS — H25813 Combined forms of age-related cataract, bilateral: Secondary | ICD-10-CM | POA: Diagnosis not present

## 2020-03-14 DIAGNOSIS — M7541 Impingement syndrome of right shoulder: Secondary | ICD-10-CM | POA: Diagnosis not present

## 2020-03-14 DIAGNOSIS — I5033 Acute on chronic diastolic (congestive) heart failure: Secondary | ICD-10-CM | POA: Diagnosis not present

## 2020-03-14 DIAGNOSIS — M25622 Stiffness of left elbow, not elsewhere classified: Secondary | ICD-10-CM | POA: Diagnosis not present

## 2020-03-14 DIAGNOSIS — X32XXXD Exposure to sunlight, subsequent encounter: Secondary | ICD-10-CM | POA: Diagnosis not present

## 2020-03-14 DIAGNOSIS — R269 Unspecified abnormalities of gait and mobility: Secondary | ICD-10-CM | POA: Diagnosis not present

## 2020-03-14 DIAGNOSIS — H353132 Nonexudative age-related macular degeneration, bilateral, intermediate dry stage: Secondary | ICD-10-CM | POA: Diagnosis not present

## 2020-03-14 DIAGNOSIS — Z9884 Bariatric surgery status: Secondary | ICD-10-CM | POA: Diagnosis not present

## 2020-03-14 DIAGNOSIS — D171 Benign lipomatous neoplasm of skin and subcutaneous tissue of trunk: Secondary | ICD-10-CM | POA: Diagnosis not present

## 2020-03-14 DIAGNOSIS — Z6838 Body mass index (BMI) 38.0-38.9, adult: Secondary | ICD-10-CM | POA: Diagnosis not present

## 2020-03-14 DIAGNOSIS — G43911 Migraine, unspecified, intractable, with status migrainosus: Secondary | ICD-10-CM | POA: Diagnosis not present

## 2020-03-14 DIAGNOSIS — J40 Bronchitis, not specified as acute or chronic: Secondary | ICD-10-CM | POA: Diagnosis not present

## 2020-03-14 DIAGNOSIS — S82409A Unspecified fracture of shaft of unspecified fibula, initial encounter for closed fracture: Secondary | ICD-10-CM | POA: Diagnosis not present

## 2020-03-14 DIAGNOSIS — H5462 Unqualified visual loss, left eye, normal vision right eye: Secondary | ICD-10-CM | POA: Diagnosis not present

## 2020-03-14 DIAGNOSIS — D72819 Decreased white blood cell count, unspecified: Secondary | ICD-10-CM | POA: Diagnosis not present

## 2020-03-14 DIAGNOSIS — S42202A Unspecified fracture of upper end of left humerus, initial encounter for closed fracture: Secondary | ICD-10-CM | POA: Diagnosis not present

## 2020-03-14 DIAGNOSIS — H2511 Age-related nuclear cataract, right eye: Secondary | ICD-10-CM | POA: Diagnosis not present

## 2020-03-14 DIAGNOSIS — H353122 Nonexudative age-related macular degeneration, left eye, intermediate dry stage: Secondary | ICD-10-CM | POA: Diagnosis not present

## 2020-03-14 DIAGNOSIS — Z1231 Encounter for screening mammogram for malignant neoplasm of breast: Secondary | ICD-10-CM | POA: Diagnosis not present

## 2020-03-14 DIAGNOSIS — K580 Irritable bowel syndrome with diarrhea: Secondary | ICD-10-CM | POA: Diagnosis not present

## 2020-03-14 DIAGNOSIS — D120 Benign neoplasm of cecum: Secondary | ICD-10-CM | POA: Diagnosis not present

## 2020-03-14 DIAGNOSIS — M359 Systemic involvement of connective tissue, unspecified: Secondary | ICD-10-CM | POA: Diagnosis not present

## 2020-03-14 DIAGNOSIS — I25119 Atherosclerotic heart disease of native coronary artery with unspecified angina pectoris: Secondary | ICD-10-CM | POA: Diagnosis not present

## 2020-03-14 DIAGNOSIS — M797 Fibromyalgia: Secondary | ICD-10-CM | POA: Diagnosis not present

## 2020-03-14 DIAGNOSIS — E119 Type 2 diabetes mellitus without complications: Secondary | ICD-10-CM | POA: Diagnosis not present

## 2020-03-14 DIAGNOSIS — C189 Malignant neoplasm of colon, unspecified: Secondary | ICD-10-CM | POA: Diagnosis not present

## 2020-03-14 DIAGNOSIS — Z8551 Personal history of malignant neoplasm of bladder: Secondary | ICD-10-CM | POA: Diagnosis not present

## 2020-03-14 DIAGNOSIS — N131 Hydronephrosis with ureteral stricture, not elsewhere classified: Secondary | ICD-10-CM | POA: Diagnosis not present

## 2020-03-14 DIAGNOSIS — F331 Major depressive disorder, recurrent, moderate: Secondary | ICD-10-CM | POA: Diagnosis not present

## 2020-03-14 DIAGNOSIS — M0609 Rheumatoid arthritis without rheumatoid factor, multiple sites: Secondary | ICD-10-CM | POA: Diagnosis not present

## 2020-03-14 DIAGNOSIS — H401232 Low-tension glaucoma, bilateral, moderate stage: Secondary | ICD-10-CM | POA: Diagnosis not present

## 2020-03-14 DIAGNOSIS — N3281 Overactive bladder: Secondary | ICD-10-CM | POA: Diagnosis not present

## 2020-03-14 DIAGNOSIS — H401331 Pigmentary glaucoma, bilateral, mild stage: Secondary | ICD-10-CM | POA: Diagnosis not present

## 2020-03-14 DIAGNOSIS — R12 Heartburn: Secondary | ICD-10-CM | POA: Diagnosis not present

## 2020-03-14 DIAGNOSIS — M79605 Pain in left leg: Secondary | ICD-10-CM | POA: Diagnosis not present

## 2020-03-14 DIAGNOSIS — M25611 Stiffness of right shoulder, not elsewhere classified: Secondary | ICD-10-CM | POA: Diagnosis not present

## 2020-03-14 DIAGNOSIS — I493 Ventricular premature depolarization: Secondary | ICD-10-CM | POA: Diagnosis not present

## 2020-03-14 DIAGNOSIS — E034 Atrophy of thyroid (acquired): Secondary | ICD-10-CM | POA: Diagnosis not present

## 2020-03-14 DIAGNOSIS — R739 Hyperglycemia, unspecified: Secondary | ICD-10-CM | POA: Diagnosis not present

## 2020-03-14 DIAGNOSIS — Z6837 Body mass index (BMI) 37.0-37.9, adult: Secondary | ICD-10-CM | POA: Diagnosis not present

## 2020-03-14 DIAGNOSIS — G3281 Cerebellar ataxia in diseases classified elsewhere: Secondary | ICD-10-CM | POA: Diagnosis not present

## 2020-03-14 DIAGNOSIS — R9389 Abnormal findings on diagnostic imaging of other specified body structures: Secondary | ICD-10-CM | POA: Diagnosis not present

## 2020-03-14 DIAGNOSIS — I129 Hypertensive chronic kidney disease with stage 1 through stage 4 chronic kidney disease, or unspecified chronic kidney disease: Secondary | ICD-10-CM | POA: Diagnosis not present

## 2020-03-14 DIAGNOSIS — N529 Male erectile dysfunction, unspecified: Secondary | ICD-10-CM | POA: Diagnosis not present

## 2020-03-14 DIAGNOSIS — N179 Acute kidney failure, unspecified: Secondary | ICD-10-CM | POA: Diagnosis not present

## 2020-03-14 DIAGNOSIS — N182 Chronic kidney disease, stage 2 (mild): Secondary | ICD-10-CM | POA: Diagnosis not present

## 2020-03-14 DIAGNOSIS — I872 Venous insufficiency (chronic) (peripheral): Secondary | ICD-10-CM | POA: Diagnosis not present

## 2020-03-14 DIAGNOSIS — N289 Disorder of kidney and ureter, unspecified: Secondary | ICD-10-CM | POA: Diagnosis not present

## 2020-03-14 DIAGNOSIS — E538 Deficiency of other specified B group vitamins: Secondary | ICD-10-CM | POA: Diagnosis not present

## 2020-03-14 DIAGNOSIS — M7542 Impingement syndrome of left shoulder: Secondary | ICD-10-CM | POA: Diagnosis not present

## 2020-03-14 DIAGNOSIS — I788 Other diseases of capillaries: Secondary | ICD-10-CM | POA: Diagnosis not present

## 2020-03-14 DIAGNOSIS — R7989 Other specified abnormal findings of blood chemistry: Secondary | ICD-10-CM | POA: Diagnosis not present

## 2020-03-14 DIAGNOSIS — M12811 Other specific arthropathies, not elsewhere classified, right shoulder: Secondary | ICD-10-CM | POA: Diagnosis not present

## 2020-03-14 DIAGNOSIS — S42202D Unspecified fracture of upper end of left humerus, subsequent encounter for fracture with routine healing: Secondary | ICD-10-CM | POA: Diagnosis not present

## 2020-03-14 DIAGNOSIS — R7303 Prediabetes: Secondary | ICD-10-CM | POA: Diagnosis not present

## 2020-03-14 DIAGNOSIS — F0150 Vascular dementia without behavioral disturbance: Secondary | ICD-10-CM | POA: Diagnosis not present

## 2020-03-14 DIAGNOSIS — E7849 Other hyperlipidemia: Secondary | ICD-10-CM | POA: Diagnosis not present

## 2020-03-14 DIAGNOSIS — M955 Acquired deformity of pelvis: Secondary | ICD-10-CM | POA: Diagnosis not present

## 2020-03-14 DIAGNOSIS — M25562 Pain in left knee: Secondary | ICD-10-CM | POA: Diagnosis not present

## 2020-03-14 DIAGNOSIS — M4316 Spondylolisthesis, lumbar region: Secondary | ICD-10-CM | POA: Diagnosis not present

## 2020-03-14 DIAGNOSIS — M9906 Segmental and somatic dysfunction of lower extremity: Secondary | ICD-10-CM | POA: Diagnosis not present

## 2020-03-14 DIAGNOSIS — B351 Tinea unguium: Secondary | ICD-10-CM | POA: Diagnosis not present

## 2020-03-14 DIAGNOSIS — Z08 Encounter for follow-up examination after completed treatment for malignant neoplasm: Secondary | ICD-10-CM | POA: Diagnosis not present

## 2020-03-14 DIAGNOSIS — Z85528 Personal history of other malignant neoplasm of kidney: Secondary | ICD-10-CM | POA: Diagnosis not present

## 2020-03-14 DIAGNOSIS — K589 Irritable bowel syndrome without diarrhea: Secondary | ICD-10-CM | POA: Diagnosis not present

## 2020-03-14 DIAGNOSIS — I824Z9 Acute embolism and thrombosis of unspecified deep veins of unspecified distal lower extremity: Secondary | ICD-10-CM | POA: Diagnosis not present

## 2020-03-14 DIAGNOSIS — M7521 Bicipital tendinitis, right shoulder: Secondary | ICD-10-CM | POA: Diagnosis not present

## 2020-03-14 DIAGNOSIS — F1029 Alcohol dependence with unspecified alcohol-induced disorder: Secondary | ICD-10-CM | POA: Diagnosis not present

## 2020-03-14 DIAGNOSIS — C73 Malignant neoplasm of thyroid gland: Secondary | ICD-10-CM | POA: Diagnosis not present

## 2020-03-14 DIAGNOSIS — I472 Ventricular tachycardia: Secondary | ICD-10-CM | POA: Diagnosis not present

## 2020-03-14 DIAGNOSIS — S52501D Unspecified fracture of the lower end of right radius, subsequent encounter for closed fracture with routine healing: Secondary | ICD-10-CM | POA: Diagnosis not present

## 2020-03-14 DIAGNOSIS — Z780 Asymptomatic menopausal state: Secondary | ICD-10-CM | POA: Diagnosis not present

## 2020-03-14 DIAGNOSIS — H4311 Vitreous hemorrhage, right eye: Secondary | ICD-10-CM | POA: Diagnosis not present

## 2020-03-14 DIAGNOSIS — L259 Unspecified contact dermatitis, unspecified cause: Secondary | ICD-10-CM | POA: Diagnosis not present

## 2020-03-14 DIAGNOSIS — M48061 Spinal stenosis, lumbar region without neurogenic claudication: Secondary | ICD-10-CM | POA: Diagnosis not present

## 2020-03-14 DIAGNOSIS — M79673 Pain in unspecified foot: Secondary | ICD-10-CM | POA: Diagnosis not present

## 2020-03-14 DIAGNOSIS — I342 Nonrheumatic mitral (valve) stenosis: Secondary | ICD-10-CM | POA: Diagnosis not present

## 2020-03-14 DIAGNOSIS — E871 Hypo-osmolality and hyponatremia: Secondary | ICD-10-CM | POA: Diagnosis not present

## 2020-03-14 DIAGNOSIS — D225 Melanocytic nevi of trunk: Secondary | ICD-10-CM | POA: Diagnosis not present

## 2020-03-14 DIAGNOSIS — N281 Cyst of kidney, acquired: Secondary | ICD-10-CM | POA: Diagnosis not present

## 2020-03-14 DIAGNOSIS — T402X5A Adverse effect of other opioids, initial encounter: Secondary | ICD-10-CM | POA: Diagnosis not present

## 2020-03-14 DIAGNOSIS — C50311 Malignant neoplasm of lower-inner quadrant of right female breast: Secondary | ICD-10-CM | POA: Diagnosis not present

## 2020-03-14 DIAGNOSIS — R296 Repeated falls: Secondary | ICD-10-CM | POA: Diagnosis not present

## 2020-03-14 DIAGNOSIS — H26492 Other secondary cataract, left eye: Secondary | ICD-10-CM | POA: Diagnosis not present

## 2020-03-14 DIAGNOSIS — H1820 Unspecified corneal edema: Secondary | ICD-10-CM | POA: Diagnosis not present

## 2020-03-14 DIAGNOSIS — H40023 Open angle with borderline findings, high risk, bilateral: Secondary | ICD-10-CM | POA: Diagnosis not present

## 2020-03-14 DIAGNOSIS — Z8546 Personal history of malignant neoplasm of prostate: Secondary | ICD-10-CM | POA: Diagnosis not present

## 2020-03-14 DIAGNOSIS — Q393 Congenital stenosis and stricture of esophagus: Secondary | ICD-10-CM | POA: Diagnosis not present

## 2020-03-14 DIAGNOSIS — F330 Major depressive disorder, recurrent, mild: Secondary | ICD-10-CM | POA: Diagnosis not present

## 2020-03-14 DIAGNOSIS — E291 Testicular hypofunction: Secondary | ICD-10-CM | POA: Diagnosis not present

## 2020-03-14 DIAGNOSIS — C44619 Basal cell carcinoma of skin of left upper limb, including shoulder: Secondary | ICD-10-CM | POA: Diagnosis not present

## 2020-03-14 DIAGNOSIS — M961 Postlaminectomy syndrome, not elsewhere classified: Secondary | ICD-10-CM | POA: Diagnosis not present

## 2020-03-14 DIAGNOSIS — M79604 Pain in right leg: Secondary | ICD-10-CM | POA: Diagnosis not present

## 2020-03-14 DIAGNOSIS — H3562 Retinal hemorrhage, left eye: Secondary | ICD-10-CM | POA: Diagnosis not present

## 2020-03-14 DIAGNOSIS — M130 Polyarthritis, unspecified: Secondary | ICD-10-CM | POA: Diagnosis not present

## 2020-03-14 DIAGNOSIS — R0609 Other forms of dyspnea: Secondary | ICD-10-CM | POA: Diagnosis not present

## 2020-03-14 DIAGNOSIS — R3915 Urgency of urination: Secondary | ICD-10-CM | POA: Diagnosis not present

## 2020-03-14 DIAGNOSIS — Z6822 Body mass index (BMI) 22.0-22.9, adult: Secondary | ICD-10-CM | POA: Diagnosis not present

## 2020-03-14 DIAGNOSIS — I4819 Other persistent atrial fibrillation: Secondary | ICD-10-CM | POA: Diagnosis not present

## 2020-03-14 DIAGNOSIS — M25552 Pain in left hip: Secondary | ICD-10-CM | POA: Diagnosis not present

## 2020-03-14 DIAGNOSIS — M8949 Other hypertrophic osteoarthropathy, multiple sites: Secondary | ICD-10-CM | POA: Diagnosis not present

## 2020-03-14 DIAGNOSIS — L03116 Cellulitis of left lower limb: Secondary | ICD-10-CM | POA: Diagnosis not present

## 2020-03-14 DIAGNOSIS — J431 Panlobular emphysema: Secondary | ICD-10-CM | POA: Diagnosis not present

## 2020-03-14 DIAGNOSIS — T84033A Mechanical loosening of internal left knee prosthetic joint, initial encounter: Secondary | ICD-10-CM | POA: Diagnosis not present

## 2020-03-14 DIAGNOSIS — H524 Presbyopia: Secondary | ICD-10-CM | POA: Diagnosis not present

## 2020-03-14 DIAGNOSIS — M85842 Other specified disorders of bone density and structure, left hand: Secondary | ICD-10-CM | POA: Diagnosis not present

## 2020-03-14 DIAGNOSIS — H26491 Other secondary cataract, right eye: Secondary | ICD-10-CM | POA: Diagnosis not present

## 2020-03-14 DIAGNOSIS — Z7189 Other specified counseling: Secondary | ICD-10-CM | POA: Diagnosis not present

## 2020-03-14 DIAGNOSIS — M1A0710 Idiopathic chronic gout, right ankle and foot, without tophus (tophi): Secondary | ICD-10-CM | POA: Diagnosis not present

## 2020-03-14 DIAGNOSIS — H31091 Other chorioretinal scars, right eye: Secondary | ICD-10-CM | POA: Diagnosis not present

## 2020-03-14 DIAGNOSIS — D2362 Other benign neoplasm of skin of left upper limb, including shoulder: Secondary | ICD-10-CM | POA: Diagnosis not present

## 2020-03-14 DIAGNOSIS — D62 Acute posthemorrhagic anemia: Secondary | ICD-10-CM | POA: Diagnosis not present

## 2020-03-14 DIAGNOSIS — K921 Melena: Secondary | ICD-10-CM | POA: Diagnosis not present

## 2020-03-14 DIAGNOSIS — I4891 Unspecified atrial fibrillation: Secondary | ICD-10-CM | POA: Diagnosis not present

## 2020-03-14 DIAGNOSIS — N189 Chronic kidney disease, unspecified: Secondary | ICD-10-CM | POA: Diagnosis not present

## 2020-03-14 DIAGNOSIS — K760 Fatty (change of) liver, not elsewhere classified: Secondary | ICD-10-CM | POA: Diagnosis not present

## 2020-03-14 DIAGNOSIS — G8929 Other chronic pain: Secondary | ICD-10-CM | POA: Diagnosis not present

## 2020-03-14 DIAGNOSIS — M13862 Other specified arthritis, left knee: Secondary | ICD-10-CM | POA: Diagnosis not present

## 2020-03-14 DIAGNOSIS — T39395A Adverse effect of other nonsteroidal anti-inflammatory drugs [NSAID], initial encounter: Secondary | ICD-10-CM | POA: Diagnosis not present

## 2020-03-14 DIAGNOSIS — Z86718 Personal history of other venous thrombosis and embolism: Secondary | ICD-10-CM | POA: Diagnosis not present

## 2020-03-14 DIAGNOSIS — L509 Urticaria, unspecified: Secondary | ICD-10-CM | POA: Diagnosis not present

## 2020-03-14 DIAGNOSIS — N135 Crossing vessel and stricture of ureter without hydronephrosis: Secondary | ICD-10-CM | POA: Diagnosis not present

## 2020-03-14 DIAGNOSIS — Z20828 Contact with and (suspected) exposure to other viral communicable diseases: Secondary | ICD-10-CM | POA: Diagnosis not present

## 2020-03-14 DIAGNOSIS — R001 Bradycardia, unspecified: Secondary | ICD-10-CM | POA: Diagnosis not present

## 2020-03-14 DIAGNOSIS — R29810 Facial weakness: Secondary | ICD-10-CM | POA: Diagnosis not present

## 2020-03-14 DIAGNOSIS — R635 Abnormal weight gain: Secondary | ICD-10-CM | POA: Diagnosis not present

## 2020-03-14 DIAGNOSIS — S336XXA Sprain of sacroiliac joint, initial encounter: Secondary | ICD-10-CM | POA: Diagnosis not present

## 2020-03-14 DIAGNOSIS — M2012 Hallux valgus (acquired), left foot: Secondary | ICD-10-CM | POA: Diagnosis not present

## 2020-03-14 DIAGNOSIS — H5212 Myopia, left eye: Secondary | ICD-10-CM | POA: Diagnosis not present

## 2020-03-14 DIAGNOSIS — Z7901 Long term (current) use of anticoagulants: Secondary | ICD-10-CM | POA: Diagnosis not present

## 2020-03-14 DIAGNOSIS — F329 Major depressive disorder, single episode, unspecified: Secondary | ICD-10-CM | POA: Diagnosis not present

## 2020-03-14 DIAGNOSIS — A419 Sepsis, unspecified organism: Secondary | ICD-10-CM | POA: Diagnosis not present

## 2020-03-14 DIAGNOSIS — I959 Hypotension, unspecified: Secondary | ICD-10-CM | POA: Diagnosis not present

## 2020-03-14 DIAGNOSIS — L608 Other nail disorders: Secondary | ICD-10-CM | POA: Diagnosis not present

## 2020-03-14 DIAGNOSIS — M7989 Other specified soft tissue disorders: Secondary | ICD-10-CM | POA: Diagnosis not present

## 2020-03-14 DIAGNOSIS — H18413 Arcus senilis, bilateral: Secondary | ICD-10-CM | POA: Diagnosis not present

## 2020-03-14 DIAGNOSIS — C50111 Malignant neoplasm of central portion of right female breast: Secondary | ICD-10-CM | POA: Diagnosis not present

## 2020-03-14 DIAGNOSIS — I110 Hypertensive heart disease with heart failure: Secondary | ICD-10-CM | POA: Diagnosis not present

## 2020-03-14 DIAGNOSIS — M19041 Primary osteoarthritis, right hand: Secondary | ICD-10-CM | POA: Diagnosis not present

## 2020-03-14 DIAGNOSIS — R451 Restlessness and agitation: Secondary | ICD-10-CM | POA: Diagnosis not present

## 2020-03-14 DIAGNOSIS — C3491 Malignant neoplasm of unspecified part of right bronchus or lung: Secondary | ICD-10-CM | POA: Diagnosis not present

## 2020-03-14 DIAGNOSIS — R040 Epistaxis: Secondary | ICD-10-CM | POA: Diagnosis not present

## 2020-03-14 DIAGNOSIS — F0281 Dementia in other diseases classified elsewhere with behavioral disturbance: Secondary | ICD-10-CM | POA: Diagnosis not present

## 2020-03-14 DIAGNOSIS — S81802A Unspecified open wound, left lower leg, initial encounter: Secondary | ICD-10-CM | POA: Diagnosis not present

## 2020-03-14 DIAGNOSIS — R232 Flushing: Secondary | ICD-10-CM | POA: Diagnosis not present

## 2020-03-14 DIAGNOSIS — G4734 Idiopathic sleep related nonobstructive alveolar hypoventilation: Secondary | ICD-10-CM | POA: Diagnosis not present

## 2020-03-14 DIAGNOSIS — I714 Abdominal aortic aneurysm, without rupture: Secondary | ICD-10-CM | POA: Diagnosis not present

## 2020-03-14 DIAGNOSIS — J320 Chronic maxillary sinusitis: Secondary | ICD-10-CM | POA: Diagnosis not present

## 2020-03-14 DIAGNOSIS — M75112 Incomplete rotator cuff tear or rupture of left shoulder, not specified as traumatic: Secondary | ICD-10-CM | POA: Diagnosis not present

## 2020-03-14 DIAGNOSIS — L03115 Cellulitis of right lower limb: Secondary | ICD-10-CM | POA: Diagnosis not present

## 2020-03-14 DIAGNOSIS — Z6835 Body mass index (BMI) 35.0-35.9, adult: Secondary | ICD-10-CM | POA: Diagnosis not present

## 2020-03-14 DIAGNOSIS — M19011 Primary osteoarthritis, right shoulder: Secondary | ICD-10-CM | POA: Diagnosis not present

## 2020-03-14 DIAGNOSIS — I2699 Other pulmonary embolism without acute cor pulmonale: Secondary | ICD-10-CM | POA: Diagnosis not present

## 2020-03-14 DIAGNOSIS — M25511 Pain in right shoulder: Secondary | ICD-10-CM | POA: Diagnosis not present

## 2020-03-14 DIAGNOSIS — Z7689 Persons encountering health services in other specified circumstances: Secondary | ICD-10-CM | POA: Diagnosis not present

## 2020-03-14 DIAGNOSIS — K2100 Gastro-esophageal reflux disease with esophagitis, without bleeding: Secondary | ICD-10-CM | POA: Diagnosis not present

## 2020-03-14 DIAGNOSIS — M4847XD Fatigue fracture of vertebra, lumbosacral region, subsequent encounter for fracture with routine healing: Secondary | ICD-10-CM | POA: Diagnosis not present

## 2020-03-14 DIAGNOSIS — M7061 Trochanteric bursitis, right hip: Secondary | ICD-10-CM | POA: Diagnosis not present

## 2020-03-14 DIAGNOSIS — G311 Senile degeneration of brain, not elsewhere classified: Secondary | ICD-10-CM | POA: Diagnosis not present

## 2020-03-14 DIAGNOSIS — L270 Generalized skin eruption due to drugs and medicaments taken internally: Secondary | ICD-10-CM | POA: Diagnosis not present

## 2020-03-14 DIAGNOSIS — S63512A Sprain of carpal joint of left wrist, initial encounter: Secondary | ICD-10-CM | POA: Diagnosis not present

## 2020-03-14 DIAGNOSIS — S63391A Traumatic rupture of other ligament of right wrist, initial encounter: Secondary | ICD-10-CM | POA: Diagnosis not present

## 2020-03-14 DIAGNOSIS — H353212 Exudative age-related macular degeneration, right eye, with inactive choroidal neovascularization: Secondary | ICD-10-CM | POA: Diagnosis not present

## 2020-03-14 DIAGNOSIS — H3581 Retinal edema: Secondary | ICD-10-CM | POA: Diagnosis not present

## 2020-03-14 DIAGNOSIS — G9340 Encephalopathy, unspecified: Secondary | ICD-10-CM | POA: Diagnosis not present

## 2020-03-14 DIAGNOSIS — L219 Seborrheic dermatitis, unspecified: Secondary | ICD-10-CM | POA: Diagnosis not present

## 2020-03-14 DIAGNOSIS — J301 Allergic rhinitis due to pollen: Secondary | ICD-10-CM | POA: Diagnosis not present

## 2020-03-14 DIAGNOSIS — H259 Unspecified age-related cataract: Secondary | ICD-10-CM | POA: Diagnosis not present

## 2020-03-14 DIAGNOSIS — S46001D Unspecified injury of muscle(s) and tendon(s) of the rotator cuff of right shoulder, subsequent encounter: Secondary | ICD-10-CM | POA: Diagnosis not present

## 2020-03-14 DIAGNOSIS — S82401K Unspecified fracture of shaft of right fibula, subsequent encounter for closed fracture with nonunion: Secondary | ICD-10-CM | POA: Diagnosis not present

## 2020-03-14 DIAGNOSIS — K5730 Diverticulosis of large intestine without perforation or abscess without bleeding: Secondary | ICD-10-CM | POA: Diagnosis not present

## 2020-03-14 DIAGNOSIS — C7931 Secondary malignant neoplasm of brain: Secondary | ICD-10-CM | POA: Diagnosis not present

## 2020-03-14 DIAGNOSIS — N130 Hydronephrosis with ureteropelvic junction obstruction: Secondary | ICD-10-CM | POA: Diagnosis not present

## 2020-03-14 DIAGNOSIS — N3000 Acute cystitis without hematuria: Secondary | ICD-10-CM | POA: Diagnosis not present

## 2020-03-14 DIAGNOSIS — L02611 Cutaneous abscess of right foot: Secondary | ICD-10-CM | POA: Diagnosis not present

## 2020-03-14 DIAGNOSIS — H34812 Central retinal vein occlusion, left eye, with macular edema: Secondary | ICD-10-CM | POA: Diagnosis not present

## 2020-03-14 DIAGNOSIS — I639 Cerebral infarction, unspecified: Secondary | ICD-10-CM | POA: Diagnosis not present

## 2020-03-14 DIAGNOSIS — M79645 Pain in left finger(s): Secondary | ICD-10-CM | POA: Diagnosis not present

## 2020-03-14 DIAGNOSIS — D485 Neoplasm of uncertain behavior of skin: Secondary | ICD-10-CM | POA: Diagnosis not present

## 2020-03-14 DIAGNOSIS — K625 Hemorrhage of anus and rectum: Secondary | ICD-10-CM | POA: Diagnosis not present

## 2020-03-14 DIAGNOSIS — M4804 Spinal stenosis, thoracic region: Secondary | ICD-10-CM | POA: Diagnosis not present

## 2020-03-14 DIAGNOSIS — L739 Follicular disorder, unspecified: Secondary | ICD-10-CM | POA: Diagnosis not present

## 2020-03-14 DIAGNOSIS — H401130 Primary open-angle glaucoma, bilateral, stage unspecified: Secondary | ICD-10-CM | POA: Diagnosis not present

## 2020-03-14 DIAGNOSIS — E079 Disorder of thyroid, unspecified: Secondary | ICD-10-CM | POA: Diagnosis not present

## 2020-03-14 DIAGNOSIS — M5442 Lumbago with sciatica, left side: Secondary | ICD-10-CM | POA: Diagnosis not present

## 2020-03-14 DIAGNOSIS — S4992XA Unspecified injury of left shoulder and upper arm, initial encounter: Secondary | ICD-10-CM | POA: Diagnosis not present

## 2020-03-14 DIAGNOSIS — N3080 Other cystitis without hematuria: Secondary | ICD-10-CM | POA: Diagnosis not present

## 2020-03-14 DIAGNOSIS — L97211 Non-pressure chronic ulcer of right calf limited to breakdown of skin: Secondary | ICD-10-CM | POA: Diagnosis not present

## 2020-03-14 DIAGNOSIS — H353112 Nonexudative age-related macular degeneration, right eye, intermediate dry stage: Secondary | ICD-10-CM | POA: Diagnosis not present

## 2020-03-14 DIAGNOSIS — M109 Gout, unspecified: Secondary | ICD-10-CM | POA: Diagnosis not present

## 2020-03-14 DIAGNOSIS — R221 Localized swelling, mass and lump, neck: Secondary | ICD-10-CM | POA: Diagnosis not present

## 2020-03-14 DIAGNOSIS — D1801 Hemangioma of skin and subcutaneous tissue: Secondary | ICD-10-CM | POA: Diagnosis not present

## 2020-03-14 DIAGNOSIS — Z933 Colostomy status: Secondary | ICD-10-CM | POA: Diagnosis not present

## 2020-03-14 DIAGNOSIS — R159 Full incontinence of feces: Secondary | ICD-10-CM | POA: Diagnosis not present

## 2020-03-14 DIAGNOSIS — M2142 Flat foot [pes planus] (acquired), left foot: Secondary | ICD-10-CM | POA: Diagnosis not present

## 2020-03-14 DIAGNOSIS — R911 Solitary pulmonary nodule: Secondary | ICD-10-CM | POA: Diagnosis not present

## 2020-03-14 DIAGNOSIS — E876 Hypokalemia: Secondary | ICD-10-CM | POA: Diagnosis not present

## 2020-03-14 DIAGNOSIS — M5416 Radiculopathy, lumbar region: Secondary | ICD-10-CM | POA: Diagnosis not present

## 2020-03-14 DIAGNOSIS — E785 Hyperlipidemia, unspecified: Secondary | ICD-10-CM | POA: Diagnosis not present

## 2020-03-14 DIAGNOSIS — C20 Malignant neoplasm of rectum: Secondary | ICD-10-CM | POA: Diagnosis not present

## 2020-03-14 DIAGNOSIS — K2930 Chronic superficial gastritis without bleeding: Secondary | ICD-10-CM | POA: Diagnosis not present

## 2020-03-14 DIAGNOSIS — Z808 Family history of malignant neoplasm of other organs or systems: Secondary | ICD-10-CM | POA: Diagnosis not present

## 2020-03-14 DIAGNOSIS — L820 Inflamed seborrheic keratosis: Secondary | ICD-10-CM | POA: Diagnosis not present

## 2020-03-14 DIAGNOSIS — H401111 Primary open-angle glaucoma, right eye, mild stage: Secondary | ICD-10-CM | POA: Diagnosis not present

## 2020-03-14 DIAGNOSIS — G96198 Other disorders of meninges, not elsewhere classified: Secondary | ICD-10-CM | POA: Diagnosis not present

## 2020-03-14 DIAGNOSIS — Z95810 Presence of automatic (implantable) cardiac defibrillator: Secondary | ICD-10-CM | POA: Diagnosis not present

## 2020-03-14 DIAGNOSIS — E559 Vitamin D deficiency, unspecified: Secondary | ICD-10-CM | POA: Diagnosis not present

## 2020-03-14 DIAGNOSIS — J449 Chronic obstructive pulmonary disease, unspecified: Secondary | ICD-10-CM | POA: Diagnosis not present

## 2020-03-14 DIAGNOSIS — M87852 Other osteonecrosis, left femur: Secondary | ICD-10-CM | POA: Diagnosis not present

## 2020-03-14 DIAGNOSIS — D689 Coagulation defect, unspecified: Secondary | ICD-10-CM | POA: Diagnosis not present

## 2020-03-14 DIAGNOSIS — H61303 Acquired stenosis of external ear canal, unspecified, bilateral: Secondary | ICD-10-CM | POA: Diagnosis not present

## 2020-03-14 DIAGNOSIS — Z471 Aftercare following joint replacement surgery: Secondary | ICD-10-CM | POA: Diagnosis not present

## 2020-03-14 DIAGNOSIS — H35711 Central serous chorioretinopathy, right eye: Secondary | ICD-10-CM | POA: Diagnosis not present

## 2020-03-14 DIAGNOSIS — L719 Rosacea, unspecified: Secondary | ICD-10-CM | POA: Diagnosis not present

## 2020-03-14 DIAGNOSIS — F418 Other specified anxiety disorders: Secondary | ICD-10-CM | POA: Diagnosis not present

## 2020-03-14 DIAGNOSIS — K2270 Barrett's esophagus without dysplasia: Secondary | ICD-10-CM | POA: Diagnosis not present

## 2020-03-14 DIAGNOSIS — N362 Urethral caruncle: Secondary | ICD-10-CM | POA: Diagnosis not present

## 2020-03-14 DIAGNOSIS — H16001 Unspecified corneal ulcer, right eye: Secondary | ICD-10-CM | POA: Diagnosis not present

## 2020-03-14 DIAGNOSIS — R2681 Unsteadiness on feet: Secondary | ICD-10-CM | POA: Diagnosis not present

## 2020-03-14 DIAGNOSIS — N898 Other specified noninflammatory disorders of vagina: Secondary | ICD-10-CM | POA: Diagnosis not present

## 2020-03-14 DIAGNOSIS — M25559 Pain in unspecified hip: Secondary | ICD-10-CM | POA: Diagnosis not present

## 2020-03-14 DIAGNOSIS — F411 Generalized anxiety disorder: Secondary | ICD-10-CM | POA: Diagnosis not present

## 2020-03-14 DIAGNOSIS — G5600 Carpal tunnel syndrome, unspecified upper limb: Secondary | ICD-10-CM | POA: Diagnosis not present

## 2020-03-14 DIAGNOSIS — R630 Anorexia: Secondary | ICD-10-CM | POA: Diagnosis not present

## 2020-03-14 DIAGNOSIS — S46012D Strain of muscle(s) and tendon(s) of the rotator cuff of left shoulder, subsequent encounter: Secondary | ICD-10-CM | POA: Diagnosis not present

## 2020-03-14 DIAGNOSIS — S8012XA Contusion of left lower leg, initial encounter: Secondary | ICD-10-CM | POA: Diagnosis not present

## 2020-03-14 DIAGNOSIS — Z981 Arthrodesis status: Secondary | ICD-10-CM | POA: Diagnosis not present

## 2020-03-14 DIAGNOSIS — F17210 Nicotine dependence, cigarettes, uncomplicated: Secondary | ICD-10-CM | POA: Diagnosis not present

## 2020-03-14 DIAGNOSIS — T371X5S Adverse effect of antimycobacterial drugs, sequela: Secondary | ICD-10-CM | POA: Diagnosis not present

## 2020-03-14 DIAGNOSIS — I6782 Cerebral ischemia: Secondary | ICD-10-CM | POA: Diagnosis not present

## 2020-03-14 DIAGNOSIS — R791 Abnormal coagulation profile: Secondary | ICD-10-CM | POA: Diagnosis not present

## 2020-03-14 DIAGNOSIS — C4372 Malignant melanoma of left lower limb, including hip: Secondary | ICD-10-CM | POA: Diagnosis not present

## 2020-03-14 DIAGNOSIS — K35891 Other acute appendicitis without perforation, with gangrene: Secondary | ICD-10-CM | POA: Diagnosis not present

## 2020-03-14 DIAGNOSIS — Q761 Klippel-Feil syndrome: Secondary | ICD-10-CM | POA: Diagnosis not present

## 2020-03-14 DIAGNOSIS — Z9119 Patient's noncompliance with other medical treatment and regimen: Secondary | ICD-10-CM | POA: Diagnosis not present

## 2020-03-14 DIAGNOSIS — M461 Sacroiliitis, not elsewhere classified: Secondary | ICD-10-CM | POA: Diagnosis not present

## 2020-03-14 DIAGNOSIS — R29898 Other symptoms and signs involving the musculoskeletal system: Secondary | ICD-10-CM | POA: Diagnosis not present

## 2020-03-14 DIAGNOSIS — R0683 Snoring: Secondary | ICD-10-CM | POA: Diagnosis not present

## 2020-03-14 DIAGNOSIS — M5440 Lumbago with sciatica, unspecified side: Secondary | ICD-10-CM | POA: Diagnosis not present

## 2020-03-14 DIAGNOSIS — L501 Idiopathic urticaria: Secondary | ICD-10-CM | POA: Diagnosis not present

## 2020-03-14 DIAGNOSIS — M1612 Unilateral primary osteoarthritis, left hip: Secondary | ICD-10-CM | POA: Diagnosis not present

## 2020-03-14 DIAGNOSIS — J029 Acute pharyngitis, unspecified: Secondary | ICD-10-CM | POA: Diagnosis not present

## 2020-03-14 DIAGNOSIS — Z139 Encounter for screening, unspecified: Secondary | ICD-10-CM | POA: Diagnosis not present

## 2020-03-14 DIAGNOSIS — M7581 Other shoulder lesions, right shoulder: Secondary | ICD-10-CM | POA: Diagnosis not present

## 2020-03-14 DIAGNOSIS — Z8619 Personal history of other infectious and parasitic diseases: Secondary | ICD-10-CM | POA: Diagnosis not present

## 2020-03-14 DIAGNOSIS — N951 Menopausal and female climacteric states: Secondary | ICD-10-CM | POA: Diagnosis not present

## 2020-03-14 DIAGNOSIS — Z820 Family history of epilepsy and other diseases of the nervous system: Secondary | ICD-10-CM | POA: Diagnosis not present

## 2020-03-14 DIAGNOSIS — K862 Cyst of pancreas: Secondary | ICD-10-CM | POA: Diagnosis not present

## 2020-03-14 DIAGNOSIS — K922 Gastrointestinal hemorrhage, unspecified: Secondary | ICD-10-CM | POA: Diagnosis not present

## 2020-03-14 DIAGNOSIS — Z79890 Hormone replacement therapy (postmenopausal): Secondary | ICD-10-CM | POA: Diagnosis not present

## 2020-03-14 DIAGNOSIS — S52502A Unspecified fracture of the lower end of left radius, initial encounter for closed fracture: Secondary | ICD-10-CM | POA: Diagnosis not present

## 2020-03-14 DIAGNOSIS — Z86010 Personal history of colonic polyps: Secondary | ICD-10-CM | POA: Diagnosis not present

## 2020-03-14 DIAGNOSIS — Z13220 Encounter for screening for lipoid disorders: Secondary | ICD-10-CM | POA: Diagnosis not present

## 2020-03-14 DIAGNOSIS — F325 Major depressive disorder, single episode, in full remission: Secondary | ICD-10-CM | POA: Diagnosis not present

## 2020-03-14 DIAGNOSIS — G43109 Migraine with aura, not intractable, without status migrainosus: Secondary | ICD-10-CM | POA: Diagnosis not present

## 2020-03-14 DIAGNOSIS — M25661 Stiffness of right knee, not elsewhere classified: Secondary | ICD-10-CM | POA: Diagnosis not present

## 2020-03-14 DIAGNOSIS — I6523 Occlusion and stenosis of bilateral carotid arteries: Secondary | ICD-10-CM | POA: Diagnosis not present

## 2020-03-14 DIAGNOSIS — F5104 Psychophysiologic insomnia: Secondary | ICD-10-CM | POA: Diagnosis not present

## 2020-03-14 DIAGNOSIS — N3020 Other chronic cystitis without hematuria: Secondary | ICD-10-CM | POA: Diagnosis not present

## 2020-03-14 DIAGNOSIS — D649 Anemia, unspecified: Secondary | ICD-10-CM | POA: Diagnosis not present

## 2020-03-14 DIAGNOSIS — Z7409 Other reduced mobility: Secondary | ICD-10-CM | POA: Diagnosis not present

## 2020-03-14 DIAGNOSIS — R011 Cardiac murmur, unspecified: Secondary | ICD-10-CM | POA: Diagnosis not present

## 2020-03-14 DIAGNOSIS — L718 Other rosacea: Secondary | ICD-10-CM | POA: Diagnosis not present

## 2020-03-14 DIAGNOSIS — Z960 Presence of urogenital implants: Secondary | ICD-10-CM | POA: Diagnosis not present

## 2020-03-14 DIAGNOSIS — K8020 Calculus of gallbladder without cholecystitis without obstruction: Secondary | ICD-10-CM | POA: Diagnosis not present

## 2020-03-14 DIAGNOSIS — L720 Epidermal cyst: Secondary | ICD-10-CM | POA: Diagnosis not present

## 2020-03-14 DIAGNOSIS — N201 Calculus of ureter: Secondary | ICD-10-CM | POA: Diagnosis not present

## 2020-03-14 DIAGNOSIS — J849 Interstitial pulmonary disease, unspecified: Secondary | ICD-10-CM | POA: Diagnosis not present

## 2020-03-14 DIAGNOSIS — E039 Hypothyroidism, unspecified: Secondary | ICD-10-CM | POA: Diagnosis not present

## 2020-03-14 DIAGNOSIS — I455 Other specified heart block: Secondary | ICD-10-CM | POA: Diagnosis not present

## 2020-03-14 DIAGNOSIS — S00411A Abrasion of right ear, initial encounter: Secondary | ICD-10-CM | POA: Diagnosis not present

## 2020-03-14 DIAGNOSIS — E789 Disorder of lipoprotein metabolism, unspecified: Secondary | ICD-10-CM | POA: Diagnosis not present

## 2020-03-14 DIAGNOSIS — M6289 Other specified disorders of muscle: Secondary | ICD-10-CM | POA: Diagnosis not present

## 2020-03-14 DIAGNOSIS — M19012 Primary osteoarthritis, left shoulder: Secondary | ICD-10-CM | POA: Diagnosis not present

## 2020-03-14 DIAGNOSIS — B0229 Other postherpetic nervous system involvement: Secondary | ICD-10-CM | POA: Diagnosis not present

## 2020-03-14 DIAGNOSIS — S62102B Fracture of unspecified carpal bone, left wrist, initial encounter for open fracture: Secondary | ICD-10-CM | POA: Diagnosis not present

## 2020-03-14 DIAGNOSIS — H57813 Brow ptosis, bilateral: Secondary | ICD-10-CM | POA: Diagnosis not present

## 2020-03-14 DIAGNOSIS — M62830 Muscle spasm of back: Secondary | ICD-10-CM | POA: Diagnosis not present

## 2020-03-14 DIAGNOSIS — K222 Esophageal obstruction: Secondary | ICD-10-CM | POA: Diagnosis not present

## 2020-03-14 DIAGNOSIS — R7301 Impaired fasting glucose: Secondary | ICD-10-CM | POA: Diagnosis not present

## 2020-03-14 DIAGNOSIS — M438X6 Other specified deforming dorsopathies, lumbar region: Secondary | ICD-10-CM | POA: Diagnosis not present

## 2020-03-14 DIAGNOSIS — G4733 Obstructive sleep apnea (adult) (pediatric): Secondary | ICD-10-CM | POA: Diagnosis not present

## 2020-03-14 DIAGNOSIS — S233XXA Sprain of ligaments of thoracic spine, initial encounter: Secondary | ICD-10-CM | POA: Diagnosis not present

## 2020-03-14 DIAGNOSIS — R0602 Shortness of breath: Secondary | ICD-10-CM | POA: Diagnosis not present

## 2020-03-14 DIAGNOSIS — K219 Gastro-esophageal reflux disease without esophagitis: Secondary | ICD-10-CM | POA: Diagnosis not present

## 2020-03-14 DIAGNOSIS — Z6823 Body mass index (BMI) 23.0-23.9, adult: Secondary | ICD-10-CM | POA: Diagnosis not present

## 2020-03-14 DIAGNOSIS — F0390 Unspecified dementia without behavioral disturbance: Secondary | ICD-10-CM | POA: Diagnosis not present

## 2020-03-14 DIAGNOSIS — M4722 Other spondylosis with radiculopathy, cervical region: Secondary | ICD-10-CM | POA: Diagnosis not present

## 2020-03-14 DIAGNOSIS — R200 Anesthesia of skin: Secondary | ICD-10-CM | POA: Diagnosis not present

## 2020-03-14 DIAGNOSIS — B373 Candidiasis of vulva and vagina: Secondary | ICD-10-CM | POA: Diagnosis not present

## 2020-03-14 DIAGNOSIS — Z87440 Personal history of urinary (tract) infections: Secondary | ICD-10-CM | POA: Diagnosis not present

## 2020-03-14 DIAGNOSIS — M79644 Pain in right finger(s): Secondary | ICD-10-CM | POA: Diagnosis not present

## 2020-03-14 DIAGNOSIS — Z7984 Long term (current) use of oral hypoglycemic drugs: Secondary | ICD-10-CM | POA: Diagnosis not present

## 2020-03-14 DIAGNOSIS — D2272 Melanocytic nevi of left lower limb, including hip: Secondary | ICD-10-CM | POA: Diagnosis not present

## 2020-03-14 DIAGNOSIS — D510 Vitamin B12 deficiency anemia due to intrinsic factor deficiency: Secondary | ICD-10-CM | POA: Diagnosis not present

## 2020-03-14 DIAGNOSIS — Z6826 Body mass index (BMI) 26.0-26.9, adult: Secondary | ICD-10-CM | POA: Diagnosis not present

## 2020-03-14 DIAGNOSIS — F1410 Cocaine abuse, uncomplicated: Secondary | ICD-10-CM | POA: Diagnosis not present

## 2020-03-14 DIAGNOSIS — I5021 Acute systolic (congestive) heart failure: Secondary | ICD-10-CM | POA: Diagnosis not present

## 2020-03-14 DIAGNOSIS — Z862 Personal history of diseases of the blood and blood-forming organs and certain disorders involving the immune mechanism: Secondary | ICD-10-CM | POA: Diagnosis not present

## 2020-03-14 DIAGNOSIS — M19042 Primary osteoarthritis, left hand: Secondary | ICD-10-CM | POA: Diagnosis not present

## 2020-03-14 DIAGNOSIS — J020 Streptococcal pharyngitis: Secondary | ICD-10-CM | POA: Diagnosis not present

## 2020-03-14 DIAGNOSIS — G4701 Insomnia due to medical condition: Secondary | ICD-10-CM | POA: Diagnosis not present

## 2020-03-14 DIAGNOSIS — M6281 Muscle weakness (generalized): Secondary | ICD-10-CM | POA: Diagnosis not present

## 2020-03-14 DIAGNOSIS — F438 Other reactions to severe stress: Secondary | ICD-10-CM | POA: Diagnosis not present

## 2020-03-14 DIAGNOSIS — H35351 Cystoid macular degeneration, right eye: Secondary | ICD-10-CM | POA: Diagnosis not present

## 2020-03-14 DIAGNOSIS — I10 Essential (primary) hypertension: Secondary | ICD-10-CM | POA: Diagnosis not present

## 2020-03-14 DIAGNOSIS — M48062 Spinal stenosis, lumbar region with neurogenic claudication: Secondary | ICD-10-CM | POA: Diagnosis not present

## 2020-03-14 DIAGNOSIS — Z794 Long term (current) use of insulin: Secondary | ICD-10-CM | POA: Diagnosis not present

## 2020-03-14 DIAGNOSIS — M19071 Primary osteoarthritis, right ankle and foot: Secondary | ICD-10-CM | POA: Diagnosis not present

## 2020-03-14 DIAGNOSIS — Z992 Dependence on renal dialysis: Secondary | ICD-10-CM | POA: Diagnosis not present

## 2020-03-14 DIAGNOSIS — R259 Unspecified abnormal involuntary movements: Secondary | ICD-10-CM | POA: Diagnosis not present

## 2020-03-14 DIAGNOSIS — L03032 Cellulitis of left toe: Secondary | ICD-10-CM | POA: Diagnosis not present

## 2020-03-14 DIAGNOSIS — R112 Nausea with vomiting, unspecified: Secondary | ICD-10-CM | POA: Diagnosis not present

## 2020-03-14 DIAGNOSIS — Z789 Other specified health status: Secondary | ICD-10-CM | POA: Diagnosis not present

## 2020-03-14 DIAGNOSIS — M7041 Prepatellar bursitis, right knee: Secondary | ICD-10-CM | POA: Diagnosis not present

## 2020-03-14 DIAGNOSIS — I495 Sick sinus syndrome: Secondary | ICD-10-CM | POA: Diagnosis not present

## 2020-03-14 DIAGNOSIS — H6091 Unspecified otitis externa, right ear: Secondary | ICD-10-CM | POA: Diagnosis not present

## 2020-03-14 DIAGNOSIS — G529 Cranial nerve disorder, unspecified: Secondary | ICD-10-CM | POA: Diagnosis not present

## 2020-03-14 DIAGNOSIS — U071 COVID-19: Secondary | ICD-10-CM | POA: Diagnosis not present

## 2020-03-14 DIAGNOSIS — M50123 Cervical disc disorder at C6-C7 level with radiculopathy: Secondary | ICD-10-CM | POA: Diagnosis not present

## 2020-03-14 DIAGNOSIS — C8514 Unspecified B-cell lymphoma, lymph nodes of axilla and upper limb: Secondary | ICD-10-CM | POA: Diagnosis not present

## 2020-03-14 DIAGNOSIS — Z96651 Presence of right artificial knee joint: Secondary | ICD-10-CM | POA: Diagnosis not present

## 2020-03-14 DIAGNOSIS — R918 Other nonspecific abnormal finding of lung field: Secondary | ICD-10-CM | POA: Diagnosis not present

## 2020-03-14 DIAGNOSIS — R079 Chest pain, unspecified: Secondary | ICD-10-CM | POA: Diagnosis not present

## 2020-03-14 DIAGNOSIS — C669 Malignant neoplasm of unspecified ureter: Secondary | ICD-10-CM | POA: Diagnosis not present

## 2020-03-14 DIAGNOSIS — G8250 Quadriplegia, unspecified: Secondary | ICD-10-CM | POA: Diagnosis not present

## 2020-03-14 DIAGNOSIS — M5412 Radiculopathy, cervical region: Secondary | ICD-10-CM | POA: Diagnosis not present

## 2020-03-14 DIAGNOSIS — M25561 Pain in right knee: Secondary | ICD-10-CM | POA: Diagnosis not present

## 2020-03-14 DIAGNOSIS — R948 Abnormal results of function studies of other organs and systems: Secondary | ICD-10-CM | POA: Diagnosis not present

## 2020-03-14 DIAGNOSIS — M810 Age-related osteoporosis without current pathological fracture: Secondary | ICD-10-CM | POA: Diagnosis not present

## 2020-03-14 DIAGNOSIS — B349 Viral infection, unspecified: Secondary | ICD-10-CM | POA: Diagnosis not present

## 2020-03-14 DIAGNOSIS — M79674 Pain in right toe(s): Secondary | ICD-10-CM | POA: Diagnosis not present

## 2020-03-14 DIAGNOSIS — I87311 Chronic venous hypertension (idiopathic) with ulcer of right lower extremity: Secondary | ICD-10-CM | POA: Diagnosis not present

## 2020-03-14 DIAGNOSIS — S63681A Other sprain of right thumb, initial encounter: Secondary | ICD-10-CM | POA: Diagnosis not present

## 2020-03-14 DIAGNOSIS — F4323 Adjustment disorder with mixed anxiety and depressed mood: Secondary | ICD-10-CM | POA: Diagnosis not present

## 2020-03-14 DIAGNOSIS — K209 Esophagitis, unspecified without bleeding: Secondary | ICD-10-CM | POA: Diagnosis not present

## 2020-03-14 DIAGNOSIS — K5903 Drug induced constipation: Secondary | ICD-10-CM | POA: Diagnosis not present

## 2020-03-14 DIAGNOSIS — E0843 Diabetes mellitus due to underlying condition with diabetic autonomic (poly)neuropathy: Secondary | ICD-10-CM | POA: Diagnosis not present

## 2020-03-14 DIAGNOSIS — Z6830 Body mass index (BMI) 30.0-30.9, adult: Secondary | ICD-10-CM | POA: Diagnosis not present

## 2020-03-14 DIAGNOSIS — K5731 Diverticulosis of large intestine without perforation or abscess with bleeding: Secondary | ICD-10-CM | POA: Diagnosis not present

## 2020-03-14 DIAGNOSIS — M9901 Segmental and somatic dysfunction of cervical region: Secondary | ICD-10-CM | POA: Diagnosis not present

## 2020-03-14 DIAGNOSIS — M722 Plantar fascial fibromatosis: Secondary | ICD-10-CM | POA: Diagnosis not present

## 2020-03-14 DIAGNOSIS — E0590 Thyrotoxicosis, unspecified without thyrotoxic crisis or storm: Secondary | ICD-10-CM | POA: Diagnosis not present

## 2020-03-14 DIAGNOSIS — R32 Unspecified urinary incontinence: Secondary | ICD-10-CM | POA: Diagnosis not present

## 2020-03-14 DIAGNOSIS — N200 Calculus of kidney: Secondary | ICD-10-CM | POA: Diagnosis not present

## 2020-03-14 DIAGNOSIS — J3089 Other allergic rhinitis: Secondary | ICD-10-CM | POA: Diagnosis not present

## 2020-03-14 DIAGNOSIS — N1330 Unspecified hydronephrosis: Secondary | ICD-10-CM | POA: Diagnosis not present

## 2020-03-14 DIAGNOSIS — M5417 Radiculopathy, lumbosacral region: Secondary | ICD-10-CM | POA: Diagnosis not present

## 2020-03-14 DIAGNOSIS — C649 Malignant neoplasm of unspecified kidney, except renal pelvis: Secondary | ICD-10-CM | POA: Diagnosis not present

## 2020-03-14 DIAGNOSIS — G459 Transient cerebral ischemic attack, unspecified: Secondary | ICD-10-CM | POA: Diagnosis not present

## 2020-03-14 DIAGNOSIS — Z6831 Body mass index (BMI) 31.0-31.9, adult: Secondary | ICD-10-CM | POA: Diagnosis not present

## 2020-03-14 DIAGNOSIS — D500 Iron deficiency anemia secondary to blood loss (chronic): Secondary | ICD-10-CM | POA: Diagnosis not present

## 2020-03-14 DIAGNOSIS — L821 Other seborrheic keratosis: Secondary | ICD-10-CM | POA: Diagnosis not present

## 2020-03-14 DIAGNOSIS — R8290 Unspecified abnormal findings in urine: Secondary | ICD-10-CM | POA: Diagnosis not present

## 2020-03-14 DIAGNOSIS — M3500 Sicca syndrome, unspecified: Secondary | ICD-10-CM | POA: Diagnosis not present

## 2020-03-14 DIAGNOSIS — M898X6 Other specified disorders of bone, lower leg: Secondary | ICD-10-CM | POA: Diagnosis not present

## 2020-03-14 DIAGNOSIS — M5030 Other cervical disc degeneration, unspecified cervical region: Secondary | ICD-10-CM | POA: Diagnosis not present

## 2020-03-14 DIAGNOSIS — J019 Acute sinusitis, unspecified: Secondary | ICD-10-CM | POA: Diagnosis not present

## 2020-03-14 DIAGNOSIS — Z888 Allergy status to other drugs, medicaments and biological substances status: Secondary | ICD-10-CM | POA: Diagnosis not present

## 2020-03-14 DIAGNOSIS — G44229 Chronic tension-type headache, not intractable: Secondary | ICD-10-CM | POA: Diagnosis not present

## 2020-03-14 DIAGNOSIS — H04123 Dry eye syndrome of bilateral lacrimal glands: Secondary | ICD-10-CM | POA: Diagnosis not present

## 2020-03-14 DIAGNOSIS — N141 Nephropathy induced by other drugs, medicaments and biological substances: Secondary | ICD-10-CM | POA: Diagnosis not present

## 2020-03-14 DIAGNOSIS — H40012 Open angle with borderline findings, low risk, left eye: Secondary | ICD-10-CM | POA: Diagnosis not present

## 2020-03-14 DIAGNOSIS — Z6828 Body mass index (BMI) 28.0-28.9, adult: Secondary | ICD-10-CM | POA: Diagnosis not present

## 2020-03-14 DIAGNOSIS — E1149 Type 2 diabetes mellitus with other diabetic neurological complication: Secondary | ICD-10-CM | POA: Diagnosis not present

## 2020-03-14 DIAGNOSIS — J432 Centrilobular emphysema: Secondary | ICD-10-CM | POA: Diagnosis not present

## 2020-03-14 DIAGNOSIS — M9905 Segmental and somatic dysfunction of pelvic region: Secondary | ICD-10-CM | POA: Diagnosis not present

## 2020-03-14 DIAGNOSIS — M533 Sacrococcygeal disorders, not elsewhere classified: Secondary | ICD-10-CM | POA: Diagnosis not present

## 2020-03-14 DIAGNOSIS — M1000 Idiopathic gout, unspecified site: Secondary | ICD-10-CM | POA: Diagnosis not present

## 2020-03-14 DIAGNOSIS — J22 Unspecified acute lower respiratory infection: Secondary | ICD-10-CM | POA: Diagnosis not present

## 2020-03-14 DIAGNOSIS — M1A0720 Idiopathic chronic gout, left ankle and foot, without tophus (tophi): Secondary | ICD-10-CM | POA: Diagnosis not present

## 2020-03-14 DIAGNOSIS — S8992XA Unspecified injury of left lower leg, initial encounter: Secondary | ICD-10-CM | POA: Diagnosis not present

## 2020-03-14 DIAGNOSIS — F320 Major depressive disorder, single episode, mild: Secondary | ICD-10-CM | POA: Diagnosis not present

## 2020-03-14 DIAGNOSIS — R110 Nausea: Secondary | ICD-10-CM | POA: Diagnosis not present

## 2020-03-14 DIAGNOSIS — M1611 Unilateral primary osteoarthritis, right hip: Secondary | ICD-10-CM | POA: Diagnosis not present

## 2020-03-14 DIAGNOSIS — F1010 Alcohol abuse, uncomplicated: Secondary | ICD-10-CM | POA: Diagnosis not present

## 2020-03-14 DIAGNOSIS — D582 Other hemoglobinopathies: Secondary | ICD-10-CM | POA: Diagnosis not present

## 2020-03-14 DIAGNOSIS — R921 Mammographic calcification found on diagnostic imaging of breast: Secondary | ICD-10-CM | POA: Diagnosis not present

## 2020-03-14 DIAGNOSIS — I63332 Cerebral infarction due to thrombosis of left posterior cerebral artery: Secondary | ICD-10-CM | POA: Diagnosis not present

## 2020-03-14 DIAGNOSIS — L97909 Non-pressure chronic ulcer of unspecified part of unspecified lower leg with unspecified severity: Secondary | ICD-10-CM | POA: Diagnosis not present

## 2020-03-14 DIAGNOSIS — C674 Malignant neoplasm of posterior wall of bladder: Secondary | ICD-10-CM | POA: Diagnosis not present

## 2020-03-14 DIAGNOSIS — Z6839 Body mass index (BMI) 39.0-39.9, adult: Secondary | ICD-10-CM | POA: Diagnosis not present

## 2020-03-14 DIAGNOSIS — R413 Other amnesia: Secondary | ICD-10-CM | POA: Diagnosis not present

## 2020-03-14 DIAGNOSIS — H35373 Puckering of macula, bilateral: Secondary | ICD-10-CM | POA: Diagnosis not present

## 2020-03-14 DIAGNOSIS — G479 Sleep disorder, unspecified: Secondary | ICD-10-CM | POA: Diagnosis not present

## 2020-03-14 DIAGNOSIS — Z8669 Personal history of other diseases of the nervous system and sense organs: Secondary | ICD-10-CM | POA: Diagnosis not present

## 2020-03-14 DIAGNOSIS — M79602 Pain in left arm: Secondary | ICD-10-CM | POA: Diagnosis not present

## 2020-03-14 DIAGNOSIS — K50818 Crohn's disease of both small and large intestine with other complication: Secondary | ICD-10-CM | POA: Diagnosis not present

## 2020-03-14 DIAGNOSIS — H25012 Cortical age-related cataract, left eye: Secondary | ICD-10-CM | POA: Diagnosis not present

## 2020-03-14 DIAGNOSIS — N186 End stage renal disease: Secondary | ICD-10-CM | POA: Diagnosis not present

## 2020-03-14 DIAGNOSIS — D49512 Neoplasm of unspecified behavior of left kidney: Secondary | ICD-10-CM | POA: Diagnosis not present

## 2020-03-14 DIAGNOSIS — L600 Ingrowing nail: Secondary | ICD-10-CM | POA: Diagnosis not present

## 2020-03-14 DIAGNOSIS — Z1331 Encounter for screening for depression: Secondary | ICD-10-CM | POA: Diagnosis not present

## 2020-03-14 DIAGNOSIS — H47391 Other disorders of optic disc, right eye: Secondary | ICD-10-CM | POA: Diagnosis not present

## 2020-03-14 DIAGNOSIS — Z0289 Encounter for other administrative examinations: Secondary | ICD-10-CM | POA: Diagnosis not present

## 2020-03-14 DIAGNOSIS — M62838 Other muscle spasm: Secondary | ICD-10-CM | POA: Diagnosis not present

## 2020-03-14 DIAGNOSIS — M2041 Other hammer toe(s) (acquired), right foot: Secondary | ICD-10-CM | POA: Diagnosis not present

## 2020-03-14 DIAGNOSIS — J302 Other seasonal allergic rhinitis: Secondary | ICD-10-CM | POA: Diagnosis not present

## 2020-03-14 DIAGNOSIS — R9431 Abnormal electrocardiogram [ECG] [EKG]: Secondary | ICD-10-CM | POA: Diagnosis not present

## 2020-03-14 DIAGNOSIS — B078 Other viral warts: Secondary | ICD-10-CM | POA: Diagnosis not present

## 2020-03-14 DIAGNOSIS — F439 Reaction to severe stress, unspecified: Secondary | ICD-10-CM | POA: Diagnosis not present

## 2020-03-14 DIAGNOSIS — D509 Iron deficiency anemia, unspecified: Secondary | ICD-10-CM | POA: Diagnosis not present

## 2020-03-14 DIAGNOSIS — H43811 Vitreous degeneration, right eye: Secondary | ICD-10-CM | POA: Diagnosis not present

## 2020-03-14 DIAGNOSIS — Z882 Allergy status to sulfonamides status: Secondary | ICD-10-CM | POA: Diagnosis not present

## 2020-03-14 DIAGNOSIS — Z1239 Encounter for other screening for malignant neoplasm of breast: Secondary | ICD-10-CM | POA: Diagnosis not present

## 2020-03-14 DIAGNOSIS — E088 Diabetes mellitus due to underlying condition with unspecified complications: Secondary | ICD-10-CM | POA: Diagnosis not present

## 2020-03-14 DIAGNOSIS — J4520 Mild intermittent asthma, uncomplicated: Secondary | ICD-10-CM | POA: Diagnosis not present

## 2020-03-14 DIAGNOSIS — M47816 Spondylosis without myelopathy or radiculopathy, lumbar region: Secondary | ICD-10-CM | POA: Diagnosis not present

## 2020-03-14 DIAGNOSIS — Z6827 Body mass index (BMI) 27.0-27.9, adult: Secondary | ICD-10-CM | POA: Diagnosis not present

## 2020-03-14 DIAGNOSIS — L910 Hypertrophic scar: Secondary | ICD-10-CM | POA: Diagnosis not present

## 2020-03-14 DIAGNOSIS — Z7982 Long term (current) use of aspirin: Secondary | ICD-10-CM | POA: Diagnosis not present

## 2020-03-14 DIAGNOSIS — S72141D Displaced intertrochanteric fracture of right femur, subsequent encounter for closed fracture with routine healing: Secondary | ICD-10-CM | POA: Diagnosis not present

## 2020-03-14 DIAGNOSIS — Z91040 Latex allergy status: Secondary | ICD-10-CM | POA: Diagnosis not present

## 2020-03-14 DIAGNOSIS — D696 Thrombocytopenia, unspecified: Secondary | ICD-10-CM | POA: Diagnosis not present

## 2020-03-14 DIAGNOSIS — M5431 Sciatica, right side: Secondary | ICD-10-CM | POA: Diagnosis not present

## 2020-03-14 DIAGNOSIS — C50411 Malignant neoplasm of upper-outer quadrant of right female breast: Secondary | ICD-10-CM | POA: Diagnosis not present

## 2020-03-14 DIAGNOSIS — E041 Nontoxic single thyroid nodule: Secondary | ICD-10-CM | POA: Diagnosis not present

## 2020-03-14 DIAGNOSIS — Z8051 Family history of malignant neoplasm of kidney: Secondary | ICD-10-CM | POA: Diagnosis not present

## 2020-03-14 DIAGNOSIS — G43809 Other migraine, not intractable, without status migrainosus: Secondary | ICD-10-CM | POA: Diagnosis not present

## 2020-03-14 DIAGNOSIS — K224 Dyskinesia of esophagus: Secondary | ICD-10-CM | POA: Diagnosis not present

## 2020-03-14 DIAGNOSIS — D519 Vitamin B12 deficiency anemia, unspecified: Secondary | ICD-10-CM | POA: Diagnosis not present

## 2020-03-14 DIAGNOSIS — L309 Dermatitis, unspecified: Secondary | ICD-10-CM | POA: Diagnosis not present

## 2020-03-14 DIAGNOSIS — G40309 Generalized idiopathic epilepsy and epileptic syndromes, not intractable, without status epilepticus: Secondary | ICD-10-CM | POA: Diagnosis not present

## 2020-03-14 DIAGNOSIS — S299XXA Unspecified injury of thorax, initial encounter: Secondary | ICD-10-CM | POA: Diagnosis not present

## 2020-03-14 DIAGNOSIS — M7062 Trochanteric bursitis, left hip: Secondary | ICD-10-CM | POA: Diagnosis not present

## 2020-03-14 DIAGNOSIS — L851 Acquired keratosis [keratoderma] palmaris et plantaris: Secondary | ICD-10-CM | POA: Diagnosis not present

## 2020-03-14 DIAGNOSIS — M159 Polyosteoarthritis, unspecified: Secondary | ICD-10-CM | POA: Diagnosis not present

## 2020-03-14 DIAGNOSIS — R202 Paresthesia of skin: Secondary | ICD-10-CM | POA: Diagnosis not present

## 2020-03-14 DIAGNOSIS — Z853 Personal history of malignant neoplasm of breast: Secondary | ICD-10-CM | POA: Diagnosis not present

## 2020-03-14 DIAGNOSIS — F4310 Post-traumatic stress disorder, unspecified: Secondary | ICD-10-CM | POA: Diagnosis not present

## 2020-03-14 DIAGNOSIS — I890 Lymphedema, not elsewhere classified: Secondary | ICD-10-CM | POA: Diagnosis not present

## 2020-03-14 DIAGNOSIS — I447 Left bundle-branch block, unspecified: Secondary | ICD-10-CM | POA: Diagnosis not present

## 2020-03-14 DIAGNOSIS — N1832 Chronic kidney disease, stage 3b: Secondary | ICD-10-CM | POA: Diagnosis not present

## 2020-03-14 DIAGNOSIS — Z85820 Personal history of malignant melanoma of skin: Secondary | ICD-10-CM | POA: Diagnosis not present

## 2020-03-14 DIAGNOSIS — C61 Malignant neoplasm of prostate: Secondary | ICD-10-CM | POA: Diagnosis not present

## 2020-03-14 DIAGNOSIS — M549 Dorsalgia, unspecified: Secondary | ICD-10-CM | POA: Diagnosis not present

## 2020-03-14 DIAGNOSIS — K50118 Crohn's disease of large intestine with other complication: Secondary | ICD-10-CM | POA: Diagnosis not present

## 2020-03-14 DIAGNOSIS — E0789 Other specified disorders of thyroid: Secondary | ICD-10-CM | POA: Diagnosis not present

## 2020-03-14 DIAGNOSIS — R809 Proteinuria, unspecified: Secondary | ICD-10-CM | POA: Diagnosis not present

## 2020-03-14 DIAGNOSIS — E1165 Type 2 diabetes mellitus with hyperglycemia: Secondary | ICD-10-CM | POA: Diagnosis not present

## 2020-03-14 DIAGNOSIS — S82209A Unspecified fracture of shaft of unspecified tibia, initial encounter for closed fracture: Secondary | ICD-10-CM | POA: Diagnosis not present

## 2020-03-14 DIAGNOSIS — T50995D Adverse effect of other drugs, medicaments and biological substances, subsequent encounter: Secondary | ICD-10-CM | POA: Diagnosis not present

## 2020-03-14 DIAGNOSIS — E113313 Type 2 diabetes mellitus with moderate nonproliferative diabetic retinopathy with macular edema, bilateral: Secondary | ICD-10-CM | POA: Diagnosis not present

## 2020-03-14 DIAGNOSIS — Z720 Tobacco use: Secondary | ICD-10-CM | POA: Diagnosis not present

## 2020-03-14 DIAGNOSIS — M19031 Primary osteoarthritis, right wrist: Secondary | ICD-10-CM | POA: Diagnosis not present

## 2020-03-14 DIAGNOSIS — G35 Multiple sclerosis: Secondary | ICD-10-CM | POA: Diagnosis not present

## 2020-03-14 DIAGNOSIS — R05 Cough: Secondary | ICD-10-CM | POA: Diagnosis not present

## 2020-03-14 DIAGNOSIS — Z630 Problems in relationship with spouse or partner: Secondary | ICD-10-CM | POA: Diagnosis not present

## 2020-03-14 DIAGNOSIS — R319 Hematuria, unspecified: Secondary | ICD-10-CM | POA: Diagnosis not present

## 2020-03-14 DIAGNOSIS — E113592 Type 2 diabetes mellitus with proliferative diabetic retinopathy without macular edema, left eye: Secondary | ICD-10-CM | POA: Diagnosis not present

## 2020-03-14 DIAGNOSIS — I120 Hypertensive chronic kidney disease with stage 5 chronic kidney disease or end stage renal disease: Secondary | ICD-10-CM | POA: Diagnosis not present

## 2020-03-14 DIAGNOSIS — N959 Unspecified menopausal and perimenopausal disorder: Secondary | ICD-10-CM | POA: Diagnosis not present

## 2020-03-14 DIAGNOSIS — M12812 Other specific arthropathies, not elsewhere classified, left shoulder: Secondary | ICD-10-CM | POA: Diagnosis not present

## 2020-03-14 DIAGNOSIS — Z6829 Body mass index (BMI) 29.0-29.9, adult: Secondary | ICD-10-CM | POA: Diagnosis not present

## 2020-03-14 DIAGNOSIS — J9801 Acute bronchospasm: Secondary | ICD-10-CM | POA: Diagnosis not present

## 2020-03-14 DIAGNOSIS — I4892 Unspecified atrial flutter: Secondary | ICD-10-CM | POA: Diagnosis not present

## 2020-03-14 DIAGNOSIS — E1159 Type 2 diabetes mellitus with other circulatory complications: Secondary | ICD-10-CM | POA: Diagnosis not present

## 2020-03-14 DIAGNOSIS — G4731 Primary central sleep apnea: Secondary | ICD-10-CM | POA: Diagnosis not present

## 2020-03-14 DIAGNOSIS — Z01411 Encounter for gynecological examination (general) (routine) with abnormal findings: Secondary | ICD-10-CM | POA: Diagnosis not present

## 2020-03-14 DIAGNOSIS — R768 Other specified abnormal immunological findings in serum: Secondary | ICD-10-CM | POA: Diagnosis not present

## 2020-03-14 DIAGNOSIS — E1122 Type 2 diabetes mellitus with diabetic chronic kidney disease: Secondary | ICD-10-CM | POA: Diagnosis not present

## 2020-03-14 DIAGNOSIS — M25551 Pain in right hip: Secondary | ICD-10-CM | POA: Diagnosis not present

## 2020-03-14 DIAGNOSIS — K7460 Unspecified cirrhosis of liver: Secondary | ICD-10-CM | POA: Diagnosis not present

## 2020-03-14 DIAGNOSIS — H1012 Acute atopic conjunctivitis, left eye: Secondary | ICD-10-CM | POA: Diagnosis not present

## 2020-03-14 DIAGNOSIS — M4126 Other idiopathic scoliosis, lumbar region: Secondary | ICD-10-CM | POA: Diagnosis not present

## 2020-03-14 DIAGNOSIS — R278 Other lack of coordination: Secondary | ICD-10-CM | POA: Diagnosis not present

## 2020-03-14 DIAGNOSIS — M25612 Stiffness of left shoulder, not elsewhere classified: Secondary | ICD-10-CM | POA: Diagnosis not present

## 2020-03-14 DIAGNOSIS — L218 Other seborrheic dermatitis: Secondary | ICD-10-CM | POA: Diagnosis not present

## 2020-03-14 DIAGNOSIS — Z0000 Encounter for general adult medical examination without abnormal findings: Secondary | ICD-10-CM | POA: Diagnosis not present

## 2020-03-14 DIAGNOSIS — H18512 Endothelial corneal dystrophy, left eye: Secondary | ICD-10-CM | POA: Diagnosis not present

## 2020-03-14 DIAGNOSIS — Z7952 Long term (current) use of systemic steroids: Secondary | ICD-10-CM | POA: Diagnosis not present

## 2020-03-14 DIAGNOSIS — Z85828 Personal history of other malignant neoplasm of skin: Secondary | ICD-10-CM | POA: Diagnosis not present

## 2020-03-14 DIAGNOSIS — S76011D Strain of muscle, fascia and tendon of right hip, subsequent encounter: Secondary | ICD-10-CM | POA: Diagnosis not present

## 2020-03-14 DIAGNOSIS — Z23 Encounter for immunization: Secondary | ICD-10-CM | POA: Diagnosis not present

## 2020-03-14 DIAGNOSIS — I69320 Aphasia following cerebral infarction: Secondary | ICD-10-CM | POA: Diagnosis not present

## 2020-03-14 DIAGNOSIS — I442 Atrioventricular block, complete: Secondary | ICD-10-CM | POA: Diagnosis not present

## 2020-03-14 DIAGNOSIS — Z09 Encounter for follow-up examination after completed treatment for conditions other than malignant neoplasm: Secondary | ICD-10-CM | POA: Diagnosis not present

## 2020-03-14 DIAGNOSIS — H40013 Open angle with borderline findings, low risk, bilateral: Secondary | ICD-10-CM | POA: Diagnosis not present

## 2020-03-14 DIAGNOSIS — R935 Abnormal findings on diagnostic imaging of other abdominal regions, including retroperitoneum: Secondary | ICD-10-CM | POA: Diagnosis not present

## 2020-03-14 DIAGNOSIS — E109 Type 1 diabetes mellitus without complications: Secondary | ICD-10-CM | POA: Diagnosis not present

## 2020-03-14 DIAGNOSIS — C7949 Secondary malignant neoplasm of other parts of nervous system: Secondary | ICD-10-CM | POA: Diagnosis not present

## 2020-03-14 DIAGNOSIS — Z825 Family history of asthma and other chronic lower respiratory diseases: Secondary | ICD-10-CM | POA: Diagnosis not present

## 2020-03-14 DIAGNOSIS — L817 Pigmented purpuric dermatosis: Secondary | ICD-10-CM | POA: Diagnosis not present

## 2020-03-14 DIAGNOSIS — G4730 Sleep apnea, unspecified: Secondary | ICD-10-CM | POA: Diagnosis not present

## 2020-03-14 DIAGNOSIS — M160 Bilateral primary osteoarthritis of hip: Secondary | ICD-10-CM | POA: Diagnosis not present

## 2020-03-14 DIAGNOSIS — H903 Sensorineural hearing loss, bilateral: Secondary | ICD-10-CM | POA: Diagnosis not present

## 2020-03-14 DIAGNOSIS — G8918 Other acute postprocedural pain: Secondary | ICD-10-CM | POA: Diagnosis not present

## 2020-03-14 DIAGNOSIS — Z6834 Body mass index (BMI) 34.0-34.9, adult: Secondary | ICD-10-CM | POA: Diagnosis not present

## 2020-03-14 DIAGNOSIS — M6208 Separation of muscle (nontraumatic), other site: Secondary | ICD-10-CM | POA: Diagnosis not present

## 2020-03-14 DIAGNOSIS — S90222A Contusion of left lesser toe(s) with damage to nail, initial encounter: Secondary | ICD-10-CM | POA: Diagnosis not present

## 2020-03-14 DIAGNOSIS — G43909 Migraine, unspecified, not intractable, without status migrainosus: Secondary | ICD-10-CM | POA: Diagnosis not present

## 2020-03-14 DIAGNOSIS — N319 Neuromuscular dysfunction of bladder, unspecified: Secondary | ICD-10-CM | POA: Diagnosis not present

## 2020-03-14 DIAGNOSIS — D508 Other iron deficiency anemias: Secondary | ICD-10-CM | POA: Diagnosis not present

## 2020-03-14 DIAGNOSIS — J418 Mixed simple and mucopurulent chronic bronchitis: Secondary | ICD-10-CM | POA: Diagnosis not present

## 2020-03-14 DIAGNOSIS — N401 Enlarged prostate with lower urinary tract symptoms: Secondary | ICD-10-CM | POA: Diagnosis not present

## 2020-03-14 DIAGNOSIS — N3010 Interstitial cystitis (chronic) without hematuria: Secondary | ICD-10-CM | POA: Diagnosis not present

## 2020-03-14 DIAGNOSIS — H60502 Unspecified acute noninfective otitis externa, left ear: Secondary | ICD-10-CM | POA: Diagnosis not present

## 2020-03-14 DIAGNOSIS — C8528 Mediastinal (thymic) large B-cell lymphoma, lymph nodes of multiple sites: Secondary | ICD-10-CM | POA: Diagnosis not present

## 2020-03-14 DIAGNOSIS — N1831 Chronic kidney disease, stage 3a: Secondary | ICD-10-CM | POA: Diagnosis not present

## 2020-03-14 DIAGNOSIS — E0821 Diabetes mellitus due to underlying condition with diabetic nephropathy: Secondary | ICD-10-CM | POA: Diagnosis not present

## 2020-03-14 DIAGNOSIS — H5123 Internuclear ophthalmoplegia, bilateral: Secondary | ICD-10-CM | POA: Diagnosis not present

## 2020-03-14 DIAGNOSIS — N5201 Erectile dysfunction due to arterial insufficiency: Secondary | ICD-10-CM | POA: Diagnosis not present

## 2020-03-14 DIAGNOSIS — G309 Alzheimer's disease, unspecified: Secondary | ICD-10-CM | POA: Diagnosis not present

## 2020-03-14 DIAGNOSIS — S0181XA Laceration without foreign body of other part of head, initial encounter: Secondary | ICD-10-CM | POA: Diagnosis not present

## 2020-03-14 DIAGNOSIS — R590 Localized enlarged lymph nodes: Secondary | ICD-10-CM | POA: Diagnosis not present

## 2020-03-14 DIAGNOSIS — R928 Other abnormal and inconclusive findings on diagnostic imaging of breast: Secondary | ICD-10-CM | POA: Diagnosis not present

## 2020-03-14 DIAGNOSIS — M79662 Pain in left lower leg: Secondary | ICD-10-CM | POA: Diagnosis not present

## 2020-03-14 DIAGNOSIS — M5134 Other intervertebral disc degeneration, thoracic region: Secondary | ICD-10-CM | POA: Diagnosis not present

## 2020-03-14 DIAGNOSIS — R21 Rash and other nonspecific skin eruption: Secondary | ICD-10-CM | POA: Diagnosis not present

## 2020-03-14 DIAGNOSIS — K259 Gastric ulcer, unspecified as acute or chronic, without hemorrhage or perforation: Secondary | ICD-10-CM | POA: Diagnosis not present

## 2020-03-14 DIAGNOSIS — F339 Major depressive disorder, recurrent, unspecified: Secondary | ICD-10-CM | POA: Diagnosis not present

## 2020-03-14 DIAGNOSIS — J300 Vasomotor rhinitis: Secondary | ICD-10-CM | POA: Diagnosis not present

## 2020-03-14 DIAGNOSIS — M13841 Other specified arthritis, right hand: Secondary | ICD-10-CM | POA: Diagnosis not present

## 2020-03-14 DIAGNOSIS — R0902 Hypoxemia: Secondary | ICD-10-CM | POA: Diagnosis not present

## 2020-03-14 DIAGNOSIS — J984 Other disorders of lung: Secondary | ICD-10-CM | POA: Diagnosis not present

## 2020-03-14 DIAGNOSIS — D702 Other drug-induced agranulocytosis: Secondary | ICD-10-CM | POA: Diagnosis not present

## 2020-03-14 DIAGNOSIS — M75101 Unspecified rotator cuff tear or rupture of right shoulder, not specified as traumatic: Secondary | ICD-10-CM | POA: Diagnosis not present

## 2020-03-14 DIAGNOSIS — Z6825 Body mass index (BMI) 25.0-25.9, adult: Secondary | ICD-10-CM | POA: Diagnosis not present

## 2020-03-14 DIAGNOSIS — I161 Hypertensive emergency: Secondary | ICD-10-CM | POA: Diagnosis not present

## 2020-03-14 DIAGNOSIS — H35013 Changes in retinal vascular appearance, bilateral: Secondary | ICD-10-CM | POA: Diagnosis not present

## 2020-03-14 DIAGNOSIS — L97509 Non-pressure chronic ulcer of other part of unspecified foot with unspecified severity: Secondary | ICD-10-CM | POA: Diagnosis not present

## 2020-03-14 DIAGNOSIS — M50321 Other cervical disc degeneration at C4-C5 level: Secondary | ICD-10-CM | POA: Diagnosis not present

## 2020-03-14 DIAGNOSIS — I480 Paroxysmal atrial fibrillation: Secondary | ICD-10-CM | POA: Diagnosis not present

## 2020-03-14 DIAGNOSIS — I5032 Chronic diastolic (congestive) heart failure: Secondary | ICD-10-CM | POA: Diagnosis not present

## 2020-03-14 DIAGNOSIS — R262 Difficulty in walking, not elsewhere classified: Secondary | ICD-10-CM | POA: Diagnosis not present

## 2020-03-14 DIAGNOSIS — I509 Heart failure, unspecified: Secondary | ICD-10-CM | POA: Diagnosis not present

## 2020-03-14 DIAGNOSIS — Z96641 Presence of right artificial hip joint: Secondary | ICD-10-CM | POA: Diagnosis not present

## 2020-03-14 DIAGNOSIS — M2042 Other hammer toe(s) (acquired), left foot: Secondary | ICD-10-CM | POA: Diagnosis not present

## 2020-03-14 DIAGNOSIS — H9201 Otalgia, right ear: Secondary | ICD-10-CM | POA: Diagnosis not present

## 2020-03-14 DIAGNOSIS — B353 Tinea pedis: Secondary | ICD-10-CM | POA: Diagnosis not present

## 2020-03-14 DIAGNOSIS — R062 Wheezing: Secondary | ICD-10-CM | POA: Diagnosis not present

## 2020-03-14 DIAGNOSIS — M47812 Spondylosis without myelopathy or radiculopathy, cervical region: Secondary | ICD-10-CM | POA: Diagnosis not present

## 2020-03-14 DIAGNOSIS — M170 Bilateral primary osteoarthritis of knee: Secondary | ICD-10-CM | POA: Diagnosis not present

## 2020-03-14 DIAGNOSIS — M25532 Pain in left wrist: Secondary | ICD-10-CM | POA: Diagnosis not present

## 2020-03-14 DIAGNOSIS — C3492 Malignant neoplasm of unspecified part of left bronchus or lung: Secondary | ICD-10-CM | POA: Diagnosis not present

## 2020-03-14 DIAGNOSIS — H00019 Hordeolum externum unspecified eye, unspecified eyelid: Secondary | ICD-10-CM | POA: Diagnosis not present

## 2020-03-14 DIAGNOSIS — I25708 Atherosclerosis of coronary artery bypass graft(s), unspecified, with other forms of angina pectoris: Secondary | ICD-10-CM | POA: Diagnosis not present

## 2020-03-14 DIAGNOSIS — R82998 Other abnormal findings in urine: Secondary | ICD-10-CM | POA: Diagnosis not present

## 2020-03-14 DIAGNOSIS — C44311 Basal cell carcinoma of skin of nose: Secondary | ICD-10-CM | POA: Diagnosis not present

## 2020-03-14 DIAGNOSIS — Z1283 Encounter for screening for malignant neoplasm of skin: Secondary | ICD-10-CM | POA: Diagnosis not present

## 2020-03-14 DIAGNOSIS — R0989 Other specified symptoms and signs involving the circulatory and respiratory systems: Secondary | ICD-10-CM | POA: Diagnosis not present

## 2020-03-14 DIAGNOSIS — L089 Local infection of the skin and subcutaneous tissue, unspecified: Secondary | ICD-10-CM | POA: Diagnosis not present

## 2020-03-14 DIAGNOSIS — E8881 Metabolic syndrome: Secondary | ICD-10-CM | POA: Diagnosis not present

## 2020-03-14 DIAGNOSIS — M60859 Other myositis, unspecified thigh: Secondary | ICD-10-CM | POA: Diagnosis not present

## 2020-03-14 DIAGNOSIS — M659 Synovitis and tenosynovitis, unspecified: Secondary | ICD-10-CM | POA: Diagnosis not present

## 2020-03-14 DIAGNOSIS — I25118 Atherosclerotic heart disease of native coronary artery with other forms of angina pectoris: Secondary | ICD-10-CM | POA: Diagnosis not present

## 2020-03-14 DIAGNOSIS — J929 Pleural plaque without asbestos: Secondary | ICD-10-CM | POA: Diagnosis not present

## 2020-03-14 DIAGNOSIS — M1A39X0 Chronic gout due to renal impairment, multiple sites, without tophus (tophi): Secondary | ICD-10-CM | POA: Diagnosis not present

## 2020-03-14 DIAGNOSIS — Z5181 Encounter for therapeutic drug level monitoring: Secondary | ICD-10-CM | POA: Diagnosis not present

## 2020-03-14 DIAGNOSIS — Z20822 Contact with and (suspected) exposure to covid-19: Secondary | ICD-10-CM | POA: Diagnosis not present

## 2020-03-14 DIAGNOSIS — R339 Retention of urine, unspecified: Secondary | ICD-10-CM | POA: Diagnosis not present

## 2020-03-14 DIAGNOSIS — F1190 Opioid use, unspecified, uncomplicated: Secondary | ICD-10-CM | POA: Diagnosis not present

## 2020-03-14 DIAGNOSIS — G44209 Tension-type headache, unspecified, not intractable: Secondary | ICD-10-CM | POA: Diagnosis not present

## 2020-03-14 DIAGNOSIS — N139 Obstructive and reflux uropathy, unspecified: Secondary | ICD-10-CM | POA: Diagnosis not present

## 2020-03-14 DIAGNOSIS — E11628 Type 2 diabetes mellitus with other skin complications: Secondary | ICD-10-CM | POA: Diagnosis not present

## 2020-03-14 DIAGNOSIS — J1282 Pneumonia due to coronavirus disease 2019: Secondary | ICD-10-CM | POA: Diagnosis not present

## 2020-03-14 DIAGNOSIS — E669 Obesity, unspecified: Secondary | ICD-10-CM | POA: Diagnosis not present

## 2020-03-14 DIAGNOSIS — F3161 Bipolar disorder, current episode mixed, mild: Secondary | ICD-10-CM | POA: Diagnosis not present

## 2020-03-14 DIAGNOSIS — M7918 Myalgia, other site: Secondary | ICD-10-CM | POA: Diagnosis not present

## 2020-03-14 DIAGNOSIS — R1032 Left lower quadrant pain: Secondary | ICD-10-CM | POA: Diagnosis not present

## 2020-03-14 DIAGNOSIS — J4540 Moderate persistent asthma, uncomplicated: Secondary | ICD-10-CM | POA: Diagnosis not present

## 2020-03-14 DIAGNOSIS — M7551 Bursitis of right shoulder: Secondary | ICD-10-CM | POA: Diagnosis not present

## 2020-03-14 DIAGNOSIS — S338XXA Sprain of other parts of lumbar spine and pelvis, initial encounter: Secondary | ICD-10-CM | POA: Diagnosis not present

## 2020-03-14 DIAGNOSIS — M19072 Primary osteoarthritis, left ankle and foot: Secondary | ICD-10-CM | POA: Diagnosis not present

## 2020-03-14 DIAGNOSIS — E6609 Other obesity due to excess calories: Secondary | ICD-10-CM | POA: Diagnosis not present

## 2020-03-14 DIAGNOSIS — H53002 Unspecified amblyopia, left eye: Secondary | ICD-10-CM | POA: Diagnosis not present

## 2020-03-14 DIAGNOSIS — Z125 Encounter for screening for malignant neoplasm of prostate: Secondary | ICD-10-CM | POA: Diagnosis not present

## 2020-03-14 DIAGNOSIS — M3131 Wegener's granulomatosis with renal involvement: Secondary | ICD-10-CM | POA: Diagnosis not present

## 2020-03-14 DIAGNOSIS — Z923 Personal history of irradiation: Secondary | ICD-10-CM | POA: Diagnosis not present

## 2020-03-14 DIAGNOSIS — I83813 Varicose veins of bilateral lower extremities with pain: Secondary | ICD-10-CM | POA: Diagnosis not present

## 2020-03-14 DIAGNOSIS — H35033 Hypertensive retinopathy, bilateral: Secondary | ICD-10-CM | POA: Diagnosis not present

## 2020-03-14 DIAGNOSIS — H353211 Exudative age-related macular degeneration, right eye, with active choroidal neovascularization: Secondary | ICD-10-CM | POA: Diagnosis not present

## 2020-03-14 DIAGNOSIS — I69354 Hemiplegia and hemiparesis following cerebral infarction affecting left non-dominant side: Secondary | ICD-10-CM | POA: Diagnosis not present

## 2020-03-14 DIAGNOSIS — R1013 Epigastric pain: Secondary | ICD-10-CM | POA: Diagnosis not present

## 2020-03-14 DIAGNOSIS — Z8679 Personal history of other diseases of the circulatory system: Secondary | ICD-10-CM | POA: Diagnosis not present

## 2020-03-14 DIAGNOSIS — D490 Neoplasm of unspecified behavior of digestive system: Secondary | ICD-10-CM | POA: Diagnosis not present

## 2020-03-14 DIAGNOSIS — M79671 Pain in right foot: Secondary | ICD-10-CM | POA: Diagnosis not present

## 2020-03-14 DIAGNOSIS — M216X1 Other acquired deformities of right foot: Secondary | ICD-10-CM | POA: Diagnosis not present

## 2020-03-14 DIAGNOSIS — E1136 Type 2 diabetes mellitus with diabetic cataract: Secondary | ICD-10-CM | POA: Diagnosis not present

## 2020-03-14 DIAGNOSIS — A084 Viral intestinal infection, unspecified: Secondary | ICD-10-CM | POA: Diagnosis not present

## 2020-03-14 DIAGNOSIS — L249 Irritant contact dermatitis, unspecified cause: Secondary | ICD-10-CM | POA: Diagnosis not present

## 2020-03-14 DIAGNOSIS — M6740 Ganglion, unspecified site: Secondary | ICD-10-CM | POA: Diagnosis not present

## 2020-03-14 DIAGNOSIS — L905 Scar conditions and fibrosis of skin: Secondary | ICD-10-CM | POA: Diagnosis not present

## 2020-03-14 DIAGNOSIS — G43711 Chronic migraine without aura, intractable, with status migrainosus: Secondary | ICD-10-CM | POA: Diagnosis not present

## 2020-03-14 DIAGNOSIS — R5383 Other fatigue: Secondary | ICD-10-CM | POA: Diagnosis not present

## 2020-03-14 DIAGNOSIS — M4807 Spinal stenosis, lumbosacral region: Secondary | ICD-10-CM | POA: Diagnosis not present

## 2020-03-14 DIAGNOSIS — M729 Fibroblastic disorder, unspecified: Secondary | ICD-10-CM | POA: Diagnosis not present

## 2020-03-14 DIAGNOSIS — M25512 Pain in left shoulder: Secondary | ICD-10-CM | POA: Diagnosis not present

## 2020-03-14 DIAGNOSIS — S82201K Unspecified fracture of shaft of right tibia, subsequent encounter for closed fracture with nonunion: Secondary | ICD-10-CM | POA: Diagnosis not present

## 2020-03-14 DIAGNOSIS — I6312 Cerebral infarction due to embolism of basilar artery: Secondary | ICD-10-CM | POA: Diagnosis not present

## 2020-03-14 DIAGNOSIS — Z96611 Presence of right artificial shoulder joint: Secondary | ICD-10-CM | POA: Diagnosis not present

## 2020-03-14 DIAGNOSIS — F322 Major depressive disorder, single episode, severe without psychotic features: Secondary | ICD-10-CM | POA: Diagnosis not present

## 2020-03-14 DIAGNOSIS — K429 Umbilical hernia without obstruction or gangrene: Secondary | ICD-10-CM | POA: Diagnosis not present

## 2020-03-14 DIAGNOSIS — M79642 Pain in left hand: Secondary | ICD-10-CM | POA: Diagnosis not present

## 2020-03-14 DIAGNOSIS — M25662 Stiffness of left knee, not elsewhere classified: Secondary | ICD-10-CM | POA: Diagnosis not present

## 2020-03-14 DIAGNOSIS — J069 Acute upper respiratory infection, unspecified: Secondary | ICD-10-CM | POA: Diagnosis not present

## 2020-03-14 DIAGNOSIS — J0140 Acute pansinusitis, unspecified: Secondary | ICD-10-CM | POA: Diagnosis not present

## 2020-03-14 DIAGNOSIS — N184 Chronic kidney disease, stage 4 (severe): Secondary | ICD-10-CM | POA: Diagnosis not present

## 2020-03-14 DIAGNOSIS — K920 Hematemesis: Secondary | ICD-10-CM | POA: Diagnosis not present

## 2020-03-14 DIAGNOSIS — G301 Alzheimer's disease with late onset: Secondary | ICD-10-CM | POA: Diagnosis not present

## 2020-03-14 DIAGNOSIS — G903 Multi-system degeneration of the autonomic nervous system: Secondary | ICD-10-CM | POA: Diagnosis not present

## 2020-03-14 DIAGNOSIS — I700 Atherosclerosis of aorta: Secondary | ICD-10-CM | POA: Diagnosis not present

## 2020-03-14 DIAGNOSIS — J9611 Chronic respiratory failure with hypoxia: Secondary | ICD-10-CM | POA: Diagnosis not present

## 2020-03-14 DIAGNOSIS — Z89412 Acquired absence of left great toe: Secondary | ICD-10-CM | POA: Diagnosis not present

## 2020-03-14 DIAGNOSIS — D229 Melanocytic nevi, unspecified: Secondary | ICD-10-CM | POA: Diagnosis not present

## 2020-03-14 DIAGNOSIS — H353131 Nonexudative age-related macular degeneration, bilateral, early dry stage: Secondary | ICD-10-CM | POA: Diagnosis not present

## 2020-03-14 DIAGNOSIS — R399 Unspecified symptoms and signs involving the genitourinary system: Secondary | ICD-10-CM | POA: Diagnosis not present

## 2020-03-14 DIAGNOSIS — M1711 Unilateral primary osteoarthritis, right knee: Secondary | ICD-10-CM | POA: Diagnosis not present

## 2020-03-14 DIAGNOSIS — H25013 Cortical age-related cataract, bilateral: Secondary | ICD-10-CM | POA: Diagnosis not present

## 2020-03-14 DIAGNOSIS — Z954 Presence of other heart-valve replacement: Secondary | ICD-10-CM | POA: Diagnosis not present

## 2020-03-14 DIAGNOSIS — Z515 Encounter for palliative care: Secondary | ICD-10-CM | POA: Diagnosis not present

## 2020-03-14 DIAGNOSIS — M6284 Sarcopenia: Secondary | ICD-10-CM | POA: Diagnosis not present

## 2020-03-14 DIAGNOSIS — H43392 Other vitreous opacities, left eye: Secondary | ICD-10-CM | POA: Diagnosis not present

## 2020-03-14 DIAGNOSIS — M4326 Fusion of spine, lumbar region: Secondary | ICD-10-CM | POA: Diagnosis not present

## 2020-03-14 DIAGNOSIS — R7309 Other abnormal glucose: Secondary | ICD-10-CM | POA: Diagnosis not present

## 2020-03-14 DIAGNOSIS — Z01812 Encounter for preprocedural laboratory examination: Secondary | ICD-10-CM | POA: Diagnosis not present

## 2020-03-14 DIAGNOSIS — H1045 Other chronic allergic conjunctivitis: Secondary | ICD-10-CM | POA: Diagnosis not present

## 2020-03-14 DIAGNOSIS — I428 Other cardiomyopathies: Secondary | ICD-10-CM | POA: Diagnosis not present

## 2020-03-14 DIAGNOSIS — I82562 Chronic embolism and thrombosis of left calf muscular vein: Secondary | ICD-10-CM | POA: Diagnosis not present

## 2020-03-14 DIAGNOSIS — X32XXXA Exposure to sunlight, initial encounter: Secondary | ICD-10-CM | POA: Diagnosis not present

## 2020-03-14 DIAGNOSIS — F0391 Unspecified dementia with behavioral disturbance: Secondary | ICD-10-CM | POA: Diagnosis not present

## 2020-03-14 DIAGNOSIS — R400 Somnolence: Secondary | ICD-10-CM | POA: Diagnosis not present

## 2020-03-14 DIAGNOSIS — H4310 Vitreous hemorrhage, unspecified eye: Secondary | ICD-10-CM | POA: Diagnosis not present

## 2020-03-14 DIAGNOSIS — R109 Unspecified abdominal pain: Secondary | ICD-10-CM | POA: Diagnosis not present

## 2020-03-14 DIAGNOSIS — E113411 Type 2 diabetes mellitus with severe nonproliferative diabetic retinopathy with macular edema, right eye: Secondary | ICD-10-CM | POA: Diagnosis not present

## 2020-03-14 DIAGNOSIS — E890 Postprocedural hypothyroidism: Secondary | ICD-10-CM | POA: Diagnosis not present

## 2020-03-14 DIAGNOSIS — Z1159 Encounter for screening for other viral diseases: Secondary | ICD-10-CM | POA: Diagnosis not present

## 2020-03-14 DIAGNOSIS — Z79891 Long term (current) use of opiate analgesic: Secondary | ICD-10-CM | POA: Diagnosis not present

## 2020-03-14 DIAGNOSIS — H43813 Vitreous degeneration, bilateral: Secondary | ICD-10-CM | POA: Diagnosis not present

## 2020-03-14 DIAGNOSIS — Z0189 Encounter for other specified special examinations: Secondary | ICD-10-CM | POA: Diagnosis not present

## 2020-03-14 DIAGNOSIS — E440 Moderate protein-calorie malnutrition: Secondary | ICD-10-CM | POA: Diagnosis not present

## 2020-03-14 DIAGNOSIS — L814 Other melanin hyperpigmentation: Secondary | ICD-10-CM | POA: Diagnosis not present

## 2020-03-14 DIAGNOSIS — M25541 Pain in joints of right hand: Secondary | ICD-10-CM | POA: Diagnosis not present

## 2020-03-14 DIAGNOSIS — S233XXD Sprain of ligaments of thoracic spine, subsequent encounter: Secondary | ICD-10-CM | POA: Diagnosis not present

## 2020-03-14 DIAGNOSIS — M5137 Other intervertebral disc degeneration, lumbosacral region: Secondary | ICD-10-CM | POA: Diagnosis not present

## 2020-03-14 DIAGNOSIS — M531 Cervicobrachial syndrome: Secondary | ICD-10-CM | POA: Diagnosis not present

## 2020-03-14 DIAGNOSIS — T63441D Toxic effect of venom of bees, accidental (unintentional), subsequent encounter: Secondary | ICD-10-CM | POA: Diagnosis not present

## 2020-03-14 DIAGNOSIS — I6310 Cerebral infarction due to embolism of unspecified precerebral artery: Secondary | ICD-10-CM | POA: Diagnosis not present

## 2020-03-14 DIAGNOSIS — H52202 Unspecified astigmatism, left eye: Secondary | ICD-10-CM | POA: Diagnosis not present

## 2020-03-14 DIAGNOSIS — M79676 Pain in unspecified toe(s): Secondary | ICD-10-CM | POA: Diagnosis not present

## 2020-03-14 DIAGNOSIS — K5520 Angiodysplasia of colon without hemorrhage: Secondary | ICD-10-CM | POA: Diagnosis not present

## 2020-03-14 DIAGNOSIS — R634 Abnormal weight loss: Secondary | ICD-10-CM | POA: Diagnosis not present

## 2020-03-14 DIAGNOSIS — I441 Atrioventricular block, second degree: Secondary | ICD-10-CM | POA: Diagnosis not present

## 2020-03-14 DIAGNOSIS — M174 Other bilateral secondary osteoarthritis of knee: Secondary | ICD-10-CM | POA: Diagnosis not present

## 2020-03-14 DIAGNOSIS — F341 Dysthymic disorder: Secondary | ICD-10-CM | POA: Diagnosis not present

## 2020-03-14 DIAGNOSIS — G5621 Lesion of ulnar nerve, right upper limb: Secondary | ICD-10-CM | POA: Diagnosis not present

## 2020-03-14 DIAGNOSIS — H6192 Disorder of left external ear, unspecified: Secondary | ICD-10-CM | POA: Diagnosis not present

## 2020-03-14 DIAGNOSIS — H9190 Unspecified hearing loss, unspecified ear: Secondary | ICD-10-CM | POA: Diagnosis not present

## 2020-03-14 DIAGNOSIS — K228 Other specified diseases of esophagus: Secondary | ICD-10-CM | POA: Diagnosis not present

## 2020-03-14 DIAGNOSIS — C3411 Malignant neoplasm of upper lobe, right bronchus or lung: Secondary | ICD-10-CM | POA: Diagnosis not present

## 2020-03-14 DIAGNOSIS — M0579 Rheumatoid arthritis with rheumatoid factor of multiple sites without organ or systems involvement: Secondary | ICD-10-CM | POA: Diagnosis not present

## 2020-03-14 DIAGNOSIS — E113511 Type 2 diabetes mellitus with proliferative diabetic retinopathy with macular edema, right eye: Secondary | ICD-10-CM | POA: Diagnosis not present

## 2020-03-14 DIAGNOSIS — D071 Carcinoma in situ of vulva: Secondary | ICD-10-CM | POA: Diagnosis not present

## 2020-03-14 DIAGNOSIS — H6093 Unspecified otitis externa, bilateral: Secondary | ICD-10-CM | POA: Diagnosis not present

## 2020-03-14 DIAGNOSIS — Z01818 Encounter for other preprocedural examination: Secondary | ICD-10-CM | POA: Diagnosis not present

## 2020-03-14 DIAGNOSIS — M542 Cervicalgia: Secondary | ICD-10-CM | POA: Diagnosis not present

## 2020-03-14 DIAGNOSIS — M2560 Stiffness of unspecified joint, not elsewhere classified: Secondary | ICD-10-CM | POA: Diagnosis not present

## 2020-03-14 DIAGNOSIS — J439 Emphysema, unspecified: Secondary | ICD-10-CM | POA: Diagnosis not present

## 2020-03-14 DIAGNOSIS — E118 Type 2 diabetes mellitus with unspecified complications: Secondary | ICD-10-CM | POA: Diagnosis not present

## 2020-03-14 DIAGNOSIS — M5146 Schmorl's nodes, lumbar region: Secondary | ICD-10-CM | POA: Diagnosis not present

## 2020-03-14 DIAGNOSIS — F3341 Major depressive disorder, recurrent, in partial remission: Secondary | ICD-10-CM | POA: Diagnosis not present

## 2020-03-14 DIAGNOSIS — C946 Myelodysplastic disease, not classified: Secondary | ICD-10-CM | POA: Diagnosis not present

## 2020-03-14 DIAGNOSIS — I482 Chronic atrial fibrillation, unspecified: Secondary | ICD-10-CM | POA: Diagnosis not present

## 2020-03-14 DIAGNOSIS — I459 Conduction disorder, unspecified: Secondary | ICD-10-CM | POA: Diagnosis not present

## 2020-03-14 DIAGNOSIS — J9601 Acute respiratory failure with hypoxia: Secondary | ICD-10-CM | POA: Diagnosis not present

## 2020-03-14 DIAGNOSIS — E782 Mixed hyperlipidemia: Secondary | ICD-10-CM | POA: Diagnosis not present

## 2020-03-14 DIAGNOSIS — G43009 Migraine without aura, not intractable, without status migrainosus: Secondary | ICD-10-CM | POA: Diagnosis not present

## 2020-03-14 DIAGNOSIS — R42 Dizziness and giddiness: Secondary | ICD-10-CM | POA: Diagnosis not present

## 2020-03-14 DIAGNOSIS — J852 Abscess of lung without pneumonia: Secondary | ICD-10-CM | POA: Diagnosis not present

## 2020-03-14 DIAGNOSIS — R972 Elevated prostate specific antigen [PSA]: Secondary | ICD-10-CM | POA: Diagnosis not present

## 2020-03-14 DIAGNOSIS — M25571 Pain in right ankle and joints of right foot: Secondary | ICD-10-CM | POA: Diagnosis not present

## 2020-03-14 DIAGNOSIS — H401123 Primary open-angle glaucoma, left eye, severe stage: Secondary | ICD-10-CM | POA: Diagnosis not present

## 2020-03-14 DIAGNOSIS — J309 Allergic rhinitis, unspecified: Secondary | ICD-10-CM | POA: Diagnosis not present

## 2020-03-14 DIAGNOSIS — F988 Other specified behavioral and emotional disorders with onset usually occurring in childhood and adolescence: Secondary | ICD-10-CM | POA: Diagnosis not present

## 2020-03-14 DIAGNOSIS — M419 Scoliosis, unspecified: Secondary | ICD-10-CM | POA: Diagnosis not present

## 2020-03-14 DIAGNOSIS — M4325 Fusion of spine, thoracolumbar region: Secondary | ICD-10-CM | POA: Diagnosis not present

## 2020-03-14 DIAGNOSIS — K648 Other hemorrhoids: Secondary | ICD-10-CM | POA: Diagnosis not present

## 2020-03-14 DIAGNOSIS — M85841 Other specified disorders of bone density and structure, right hand: Secondary | ICD-10-CM | POA: Diagnosis not present

## 2020-03-14 DIAGNOSIS — H43823 Vitreomacular adhesion, bilateral: Secondary | ICD-10-CM | POA: Diagnosis not present

## 2020-03-14 DIAGNOSIS — K5909 Other constipation: Secondary | ICD-10-CM | POA: Diagnosis not present

## 2020-03-14 DIAGNOSIS — L97201 Non-pressure chronic ulcer of unspecified calf limited to breakdown of skin: Secondary | ICD-10-CM | POA: Diagnosis not present

## 2020-03-14 DIAGNOSIS — M5432 Sciatica, left side: Secondary | ICD-10-CM | POA: Diagnosis not present

## 2020-03-15 DIAGNOSIS — I700 Atherosclerosis of aorta: Secondary | ICD-10-CM | POA: Diagnosis not present

## 2020-03-15 DIAGNOSIS — F482 Pseudobulbar affect: Secondary | ICD-10-CM | POA: Diagnosis not present

## 2020-03-15 DIAGNOSIS — Z170 Estrogen receptor positive status [ER+]: Secondary | ICD-10-CM | POA: Diagnosis not present

## 2020-03-15 DIAGNOSIS — R21 Rash and other nonspecific skin eruption: Secondary | ICD-10-CM | POA: Diagnosis not present

## 2020-03-15 DIAGNOSIS — Z5111 Encounter for antineoplastic chemotherapy: Secondary | ICD-10-CM | POA: Diagnosis not present

## 2020-03-15 DIAGNOSIS — I6340 Cerebral infarction due to embolism of unspecified cerebral artery: Secondary | ICD-10-CM | POA: Diagnosis not present

## 2020-03-15 DIAGNOSIS — S32032S Unstable burst fracture of third lumbar vertebra, sequela: Secondary | ICD-10-CM | POA: Diagnosis not present

## 2020-03-15 DIAGNOSIS — Z947 Corneal transplant status: Secondary | ICD-10-CM | POA: Diagnosis not present

## 2020-03-15 DIAGNOSIS — E1142 Type 2 diabetes mellitus with diabetic polyneuropathy: Secondary | ICD-10-CM | POA: Diagnosis not present

## 2020-03-15 DIAGNOSIS — E441 Mild protein-calorie malnutrition: Secondary | ICD-10-CM | POA: Diagnosis not present

## 2020-03-15 DIAGNOSIS — I428 Other cardiomyopathies: Secondary | ICD-10-CM | POA: Diagnosis not present

## 2020-03-15 DIAGNOSIS — N182 Chronic kidney disease, stage 2 (mild): Secondary | ICD-10-CM | POA: Diagnosis not present

## 2020-03-15 DIAGNOSIS — N390 Urinary tract infection, site not specified: Secondary | ICD-10-CM | POA: Diagnosis not present

## 2020-03-15 DIAGNOSIS — I4819 Other persistent atrial fibrillation: Secondary | ICD-10-CM | POA: Diagnosis not present

## 2020-03-15 DIAGNOSIS — R1314 Dysphagia, pharyngoesophageal phase: Secondary | ICD-10-CM | POA: Diagnosis not present

## 2020-03-15 DIAGNOSIS — U071 COVID-19: Secondary | ICD-10-CM | POA: Diagnosis not present

## 2020-03-15 DIAGNOSIS — Z885 Allergy status to narcotic agent status: Secondary | ICD-10-CM | POA: Diagnosis not present

## 2020-03-15 DIAGNOSIS — M19041 Primary osteoarthritis, right hand: Secondary | ICD-10-CM | POA: Diagnosis not present

## 2020-03-15 DIAGNOSIS — G5762 Lesion of plantar nerve, left lower limb: Secondary | ICD-10-CM | POA: Diagnosis not present

## 2020-03-15 DIAGNOSIS — C3490 Malignant neoplasm of unspecified part of unspecified bronchus or lung: Secondary | ICD-10-CM | POA: Diagnosis not present

## 2020-03-15 DIAGNOSIS — K5909 Other constipation: Secondary | ICD-10-CM | POA: Diagnosis not present

## 2020-03-15 DIAGNOSIS — J1282 Pneumonia due to coronavirus disease 2019: Secondary | ICD-10-CM | POA: Diagnosis not present

## 2020-03-15 DIAGNOSIS — M2041 Other hammer toe(s) (acquired), right foot: Secondary | ICD-10-CM | POA: Diagnosis not present

## 2020-03-15 DIAGNOSIS — I679 Cerebrovascular disease, unspecified: Secondary | ICD-10-CM | POA: Diagnosis not present

## 2020-03-15 DIAGNOSIS — J449 Chronic obstructive pulmonary disease, unspecified: Secondary | ICD-10-CM | POA: Diagnosis not present

## 2020-03-15 DIAGNOSIS — H26492 Other secondary cataract, left eye: Secondary | ICD-10-CM | POA: Diagnosis not present

## 2020-03-15 DIAGNOSIS — R0982 Postnasal drip: Secondary | ICD-10-CM | POA: Diagnosis not present

## 2020-03-15 DIAGNOSIS — F0281 Dementia in other diseases classified elsewhere with behavioral disturbance: Secondary | ICD-10-CM | POA: Diagnosis not present

## 2020-03-15 DIAGNOSIS — M5432 Sciatica, left side: Secondary | ICD-10-CM | POA: Diagnosis not present

## 2020-03-15 DIAGNOSIS — I69391 Dysphagia following cerebral infarction: Secondary | ICD-10-CM | POA: Diagnosis not present

## 2020-03-15 DIAGNOSIS — M5030 Other cervical disc degeneration, unspecified cervical region: Secondary | ICD-10-CM | POA: Diagnosis not present

## 2020-03-15 DIAGNOSIS — M7022 Olecranon bursitis, left elbow: Secondary | ICD-10-CM | POA: Diagnosis not present

## 2020-03-15 DIAGNOSIS — E876 Hypokalemia: Secondary | ICD-10-CM | POA: Diagnosis not present

## 2020-03-15 DIAGNOSIS — J310 Chronic rhinitis: Secondary | ICD-10-CM | POA: Diagnosis not present

## 2020-03-15 DIAGNOSIS — Z4889 Encounter for other specified surgical aftercare: Secondary | ICD-10-CM | POA: Diagnosis not present

## 2020-03-15 DIAGNOSIS — K2100 Gastro-esophageal reflux disease with esophagitis, without bleeding: Secondary | ICD-10-CM | POA: Diagnosis not present

## 2020-03-15 DIAGNOSIS — N401 Enlarged prostate with lower urinary tract symptoms: Secondary | ICD-10-CM | POA: Diagnosis not present

## 2020-03-15 DIAGNOSIS — M4807 Spinal stenosis, lumbosacral region: Secondary | ICD-10-CM | POA: Diagnosis not present

## 2020-03-15 DIAGNOSIS — Z79891 Long term (current) use of opiate analgesic: Secondary | ICD-10-CM | POA: Diagnosis not present

## 2020-03-15 DIAGNOSIS — Z7409 Other reduced mobility: Secondary | ICD-10-CM | POA: Diagnosis not present

## 2020-03-15 DIAGNOSIS — J849 Interstitial pulmonary disease, unspecified: Secondary | ICD-10-CM | POA: Diagnosis not present

## 2020-03-15 DIAGNOSIS — Z940 Kidney transplant status: Secondary | ICD-10-CM | POA: Diagnosis not present

## 2020-03-15 DIAGNOSIS — T6701XA Heatstroke and sunstroke, initial encounter: Secondary | ICD-10-CM | POA: Diagnosis not present

## 2020-03-15 DIAGNOSIS — C44622 Squamous cell carcinoma of skin of right upper limb, including shoulder: Secondary | ICD-10-CM | POA: Diagnosis not present

## 2020-03-15 DIAGNOSIS — B9689 Other specified bacterial agents as the cause of diseases classified elsewhere: Secondary | ICD-10-CM | POA: Diagnosis not present

## 2020-03-15 DIAGNOSIS — I5042 Chronic combined systolic (congestive) and diastolic (congestive) heart failure: Secondary | ICD-10-CM | POA: Diagnosis not present

## 2020-03-15 DIAGNOSIS — L570 Actinic keratosis: Secondary | ICD-10-CM | POA: Diagnosis not present

## 2020-03-15 DIAGNOSIS — D251 Intramural leiomyoma of uterus: Secondary | ICD-10-CM | POA: Diagnosis not present

## 2020-03-15 DIAGNOSIS — R1013 Epigastric pain: Secondary | ICD-10-CM | POA: Diagnosis not present

## 2020-03-15 DIAGNOSIS — R509 Fever, unspecified: Secondary | ICD-10-CM | POA: Diagnosis not present

## 2020-03-15 DIAGNOSIS — G501 Atypical facial pain: Secondary | ICD-10-CM | POA: Diagnosis not present

## 2020-03-15 DIAGNOSIS — Z882 Allergy status to sulfonamides status: Secondary | ICD-10-CM | POA: Diagnosis not present

## 2020-03-15 DIAGNOSIS — Z1211 Encounter for screening for malignant neoplasm of colon: Secondary | ICD-10-CM | POA: Diagnosis not present

## 2020-03-15 DIAGNOSIS — K2900 Acute gastritis without bleeding: Secondary | ICD-10-CM | POA: Diagnosis not present

## 2020-03-15 DIAGNOSIS — H40013 Open angle with borderline findings, low risk, bilateral: Secondary | ICD-10-CM | POA: Diagnosis not present

## 2020-03-15 DIAGNOSIS — H20021 Recurrent acute iridocyclitis, right eye: Secondary | ICD-10-CM | POA: Diagnosis not present

## 2020-03-15 DIAGNOSIS — M79601 Pain in right arm: Secondary | ICD-10-CM | POA: Diagnosis not present

## 2020-03-15 DIAGNOSIS — E119 Type 2 diabetes mellitus without complications: Secondary | ICD-10-CM | POA: Diagnosis not present

## 2020-03-15 DIAGNOSIS — R5383 Other fatigue: Secondary | ICD-10-CM | POA: Diagnosis not present

## 2020-03-15 DIAGNOSIS — G2581 Restless legs syndrome: Secondary | ICD-10-CM | POA: Diagnosis not present

## 2020-03-15 DIAGNOSIS — M7671 Peroneal tendinitis, right leg: Secondary | ICD-10-CM | POA: Diagnosis not present

## 2020-03-15 DIAGNOSIS — G5131 Clonic hemifacial spasm, right: Secondary | ICD-10-CM | POA: Diagnosis not present

## 2020-03-15 DIAGNOSIS — F17218 Nicotine dependence, cigarettes, with other nicotine-induced disorders: Secondary | ICD-10-CM | POA: Diagnosis not present

## 2020-03-15 DIAGNOSIS — F339 Major depressive disorder, recurrent, unspecified: Secondary | ICD-10-CM | POA: Diagnosis not present

## 2020-03-15 DIAGNOSIS — R2681 Unsteadiness on feet: Secondary | ICD-10-CM | POA: Diagnosis not present

## 2020-03-15 DIAGNOSIS — M79604 Pain in right leg: Secondary | ICD-10-CM | POA: Diagnosis not present

## 2020-03-15 DIAGNOSIS — F341 Dysthymic disorder: Secondary | ICD-10-CM | POA: Diagnosis not present

## 2020-03-15 DIAGNOSIS — G250 Essential tremor: Secondary | ICD-10-CM | POA: Diagnosis not present

## 2020-03-15 DIAGNOSIS — Z131 Encounter for screening for diabetes mellitus: Secondary | ICD-10-CM | POA: Diagnosis not present

## 2020-03-15 DIAGNOSIS — R232 Flushing: Secondary | ICD-10-CM | POA: Diagnosis not present

## 2020-03-15 DIAGNOSIS — X32XXXD Exposure to sunlight, subsequent encounter: Secondary | ICD-10-CM | POA: Diagnosis not present

## 2020-03-15 DIAGNOSIS — K7581 Nonalcoholic steatohepatitis (NASH): Secondary | ICD-10-CM | POA: Diagnosis not present

## 2020-03-15 DIAGNOSIS — H35033 Hypertensive retinopathy, bilateral: Secondary | ICD-10-CM | POA: Diagnosis not present

## 2020-03-15 DIAGNOSIS — M205X1 Other deformities of toe(s) (acquired), right foot: Secondary | ICD-10-CM | POA: Diagnosis not present

## 2020-03-15 DIAGNOSIS — M150 Primary generalized (osteo)arthritis: Secondary | ICD-10-CM | POA: Diagnosis not present

## 2020-03-15 DIAGNOSIS — K7469 Other cirrhosis of liver: Secondary | ICD-10-CM | POA: Diagnosis not present

## 2020-03-15 DIAGNOSIS — M1990 Unspecified osteoarthritis, unspecified site: Secondary | ICD-10-CM | POA: Diagnosis not present

## 2020-03-15 DIAGNOSIS — M25552 Pain in left hip: Secondary | ICD-10-CM | POA: Diagnosis not present

## 2020-03-15 DIAGNOSIS — L97511 Non-pressure chronic ulcer of other part of right foot limited to breakdown of skin: Secondary | ICD-10-CM | POA: Diagnosis not present

## 2020-03-15 DIAGNOSIS — M4154 Other secondary scoliosis, thoracic region: Secondary | ICD-10-CM | POA: Diagnosis not present

## 2020-03-15 DIAGNOSIS — R0782 Intercostal pain: Secondary | ICD-10-CM | POA: Diagnosis not present

## 2020-03-15 DIAGNOSIS — K625 Hemorrhage of anus and rectum: Secondary | ICD-10-CM | POA: Diagnosis not present

## 2020-03-15 DIAGNOSIS — R739 Hyperglycemia, unspecified: Secondary | ICD-10-CM | POA: Diagnosis not present

## 2020-03-15 DIAGNOSIS — Z136 Encounter for screening for cardiovascular disorders: Secondary | ICD-10-CM | POA: Diagnosis not present

## 2020-03-15 DIAGNOSIS — N950 Postmenopausal bleeding: Secondary | ICD-10-CM | POA: Diagnosis not present

## 2020-03-15 DIAGNOSIS — Z8781 Personal history of (healed) traumatic fracture: Secondary | ICD-10-CM | POA: Diagnosis not present

## 2020-03-15 DIAGNOSIS — D252 Subserosal leiomyoma of uterus: Secondary | ICD-10-CM | POA: Diagnosis not present

## 2020-03-15 DIAGNOSIS — E1139 Type 2 diabetes mellitus with other diabetic ophthalmic complication: Secondary | ICD-10-CM | POA: Diagnosis not present

## 2020-03-15 DIAGNOSIS — M1812 Unilateral primary osteoarthritis of first carpometacarpal joint, left hand: Secondary | ICD-10-CM | POA: Diagnosis not present

## 2020-03-15 DIAGNOSIS — G4489 Other headache syndrome: Secondary | ICD-10-CM | POA: Diagnosis not present

## 2020-03-15 DIAGNOSIS — D510 Vitamin B12 deficiency anemia due to intrinsic factor deficiency: Secondary | ICD-10-CM | POA: Diagnosis not present

## 2020-03-15 DIAGNOSIS — M6281 Muscle weakness (generalized): Secondary | ICD-10-CM | POA: Diagnosis not present

## 2020-03-15 DIAGNOSIS — I82412 Acute embolism and thrombosis of left femoral vein: Secondary | ICD-10-CM | POA: Diagnosis not present

## 2020-03-15 DIAGNOSIS — R1084 Generalized abdominal pain: Secondary | ICD-10-CM | POA: Diagnosis not present

## 2020-03-15 DIAGNOSIS — E113293 Type 2 diabetes mellitus with mild nonproliferative diabetic retinopathy without macular edema, bilateral: Secondary | ICD-10-CM | POA: Diagnosis not present

## 2020-03-15 DIAGNOSIS — M19012 Primary osteoarthritis, left shoulder: Secondary | ICD-10-CM | POA: Diagnosis not present

## 2020-03-15 DIAGNOSIS — R4189 Other symptoms and signs involving cognitive functions and awareness: Secondary | ICD-10-CM | POA: Diagnosis not present

## 2020-03-15 DIAGNOSIS — F5232 Male orgasmic disorder: Secondary | ICD-10-CM | POA: Diagnosis not present

## 2020-03-15 DIAGNOSIS — M7989 Other specified soft tissue disorders: Secondary | ICD-10-CM | POA: Diagnosis not present

## 2020-03-15 DIAGNOSIS — C19 Malignant neoplasm of rectosigmoid junction: Secondary | ICD-10-CM | POA: Diagnosis not present

## 2020-03-15 DIAGNOSIS — I8312 Varicose veins of left lower extremity with inflammation: Secondary | ICD-10-CM | POA: Diagnosis not present

## 2020-03-15 DIAGNOSIS — I2510 Atherosclerotic heart disease of native coronary artery without angina pectoris: Secondary | ICD-10-CM | POA: Diagnosis not present

## 2020-03-15 DIAGNOSIS — J4540 Moderate persistent asthma, uncomplicated: Secondary | ICD-10-CM | POA: Diagnosis not present

## 2020-03-15 DIAGNOSIS — M9903 Segmental and somatic dysfunction of lumbar region: Secondary | ICD-10-CM | POA: Diagnosis not present

## 2020-03-15 DIAGNOSIS — M5415 Radiculopathy, thoracolumbar region: Secondary | ICD-10-CM | POA: Diagnosis not present

## 2020-03-15 DIAGNOSIS — E210 Primary hyperparathyroidism: Secondary | ICD-10-CM | POA: Diagnosis not present

## 2020-03-15 DIAGNOSIS — Z48812 Encounter for surgical aftercare following surgery on the circulatory system: Secondary | ICD-10-CM | POA: Diagnosis not present

## 2020-03-15 DIAGNOSIS — M25512 Pain in left shoulder: Secondary | ICD-10-CM | POA: Diagnosis not present

## 2020-03-15 DIAGNOSIS — S70362A Insect bite (nonvenomous), left thigh, initial encounter: Secondary | ICD-10-CM | POA: Diagnosis not present

## 2020-03-15 DIAGNOSIS — Z79899 Other long term (current) drug therapy: Secondary | ICD-10-CM | POA: Diagnosis not present

## 2020-03-15 DIAGNOSIS — M7752 Other enthesopathy of left foot: Secondary | ICD-10-CM | POA: Diagnosis not present

## 2020-03-15 DIAGNOSIS — I214 Non-ST elevation (NSTEMI) myocardial infarction: Secondary | ICD-10-CM | POA: Diagnosis not present

## 2020-03-15 DIAGNOSIS — I63013 Cerebral infarction due to thrombosis of bilateral vertebral arteries: Secondary | ICD-10-CM | POA: Diagnosis not present

## 2020-03-15 DIAGNOSIS — M62830 Muscle spasm of back: Secondary | ICD-10-CM | POA: Diagnosis not present

## 2020-03-15 DIAGNOSIS — M7542 Impingement syndrome of left shoulder: Secondary | ICD-10-CM | POA: Diagnosis not present

## 2020-03-15 DIAGNOSIS — Z6830 Body mass index (BMI) 30.0-30.9, adult: Secondary | ICD-10-CM | POA: Diagnosis not present

## 2020-03-15 DIAGNOSIS — H9190 Unspecified hearing loss, unspecified ear: Secondary | ICD-10-CM | POA: Diagnosis not present

## 2020-03-15 DIAGNOSIS — M13842 Other specified arthritis, left hand: Secondary | ICD-10-CM | POA: Diagnosis not present

## 2020-03-15 DIAGNOSIS — F314 Bipolar disorder, current episode depressed, severe, without psychotic features: Secondary | ICD-10-CM | POA: Diagnosis not present

## 2020-03-15 DIAGNOSIS — L821 Other seborrheic keratosis: Secondary | ICD-10-CM | POA: Diagnosis not present

## 2020-03-15 DIAGNOSIS — M67439 Ganglion, unspecified wrist: Secondary | ICD-10-CM | POA: Diagnosis not present

## 2020-03-15 DIAGNOSIS — M545 Low back pain: Secondary | ICD-10-CM | POA: Diagnosis not present

## 2020-03-15 DIAGNOSIS — R479 Unspecified speech disturbances: Secondary | ICD-10-CM | POA: Diagnosis not present

## 2020-03-15 DIAGNOSIS — Z8249 Family history of ischemic heart disease and other diseases of the circulatory system: Secondary | ICD-10-CM | POA: Diagnosis not present

## 2020-03-15 DIAGNOSIS — Z6823 Body mass index (BMI) 23.0-23.9, adult: Secondary | ICD-10-CM | POA: Diagnosis not present

## 2020-03-15 DIAGNOSIS — Z86718 Personal history of other venous thrombosis and embolism: Secondary | ICD-10-CM | POA: Diagnosis not present

## 2020-03-15 DIAGNOSIS — I82629 Acute embolism and thrombosis of deep veins of unspecified upper extremity: Secondary | ICD-10-CM | POA: Diagnosis not present

## 2020-03-15 DIAGNOSIS — E871 Hypo-osmolality and hyponatremia: Secondary | ICD-10-CM | POA: Diagnosis not present

## 2020-03-15 DIAGNOSIS — E113313 Type 2 diabetes mellitus with moderate nonproliferative diabetic retinopathy with macular edema, bilateral: Secondary | ICD-10-CM | POA: Diagnosis not present

## 2020-03-15 DIAGNOSIS — B009 Herpesviral infection, unspecified: Secondary | ICD-10-CM | POA: Diagnosis not present

## 2020-03-15 DIAGNOSIS — H35363 Drusen (degenerative) of macula, bilateral: Secondary | ICD-10-CM | POA: Diagnosis not present

## 2020-03-15 DIAGNOSIS — N816 Rectocele: Secondary | ICD-10-CM | POA: Diagnosis not present

## 2020-03-15 DIAGNOSIS — I77810 Thoracic aortic ectasia: Secondary | ICD-10-CM | POA: Diagnosis not present

## 2020-03-15 DIAGNOSIS — I5022 Chronic systolic (congestive) heart failure: Secondary | ICD-10-CM | POA: Diagnosis not present

## 2020-03-15 DIAGNOSIS — M47816 Spondylosis without myelopathy or radiculopathy, lumbar region: Secondary | ICD-10-CM | POA: Diagnosis not present

## 2020-03-15 DIAGNOSIS — K5732 Diverticulitis of large intestine without perforation or abscess without bleeding: Secondary | ICD-10-CM | POA: Diagnosis not present

## 2020-03-15 DIAGNOSIS — Z95810 Presence of automatic (implantable) cardiac defibrillator: Secondary | ICD-10-CM | POA: Diagnosis not present

## 2020-03-15 DIAGNOSIS — R32 Unspecified urinary incontinence: Secondary | ICD-10-CM | POA: Diagnosis not present

## 2020-03-15 DIAGNOSIS — S67197A Crushing injury of left little finger, initial encounter: Secondary | ICD-10-CM | POA: Diagnosis not present

## 2020-03-15 DIAGNOSIS — M9905 Segmental and somatic dysfunction of pelvic region: Secondary | ICD-10-CM | POA: Diagnosis not present

## 2020-03-15 DIAGNOSIS — D500 Iron deficiency anemia secondary to blood loss (chronic): Secondary | ICD-10-CM | POA: Diagnosis not present

## 2020-03-15 DIAGNOSIS — Z5112 Encounter for antineoplastic immunotherapy: Secondary | ICD-10-CM | POA: Diagnosis not present

## 2020-03-15 DIAGNOSIS — R195 Other fecal abnormalities: Secondary | ICD-10-CM | POA: Diagnosis not present

## 2020-03-15 DIAGNOSIS — N5201 Erectile dysfunction due to arterial insufficiency: Secondary | ICD-10-CM | POA: Diagnosis not present

## 2020-03-15 DIAGNOSIS — R102 Pelvic and perineal pain: Secondary | ICD-10-CM | POA: Diagnosis not present

## 2020-03-15 DIAGNOSIS — M792 Neuralgia and neuritis, unspecified: Secondary | ICD-10-CM | POA: Diagnosis not present

## 2020-03-15 DIAGNOSIS — X32XXXA Exposure to sunlight, initial encounter: Secondary | ICD-10-CM | POA: Diagnosis not present

## 2020-03-15 DIAGNOSIS — I130 Hypertensive heart and chronic kidney disease with heart failure and stage 1 through stage 4 chronic kidney disease, or unspecified chronic kidney disease: Secondary | ICD-10-CM | POA: Diagnosis not present

## 2020-03-15 DIAGNOSIS — H40033 Anatomical narrow angle, bilateral: Secondary | ICD-10-CM | POA: Diagnosis not present

## 2020-03-15 DIAGNOSIS — G9341 Metabolic encephalopathy: Secondary | ICD-10-CM | POA: Diagnosis not present

## 2020-03-15 DIAGNOSIS — R7302 Impaired glucose tolerance (oral): Secondary | ICD-10-CM | POA: Diagnosis not present

## 2020-03-15 DIAGNOSIS — K760 Fatty (change of) liver, not elsewhere classified: Secondary | ICD-10-CM | POA: Diagnosis not present

## 2020-03-15 DIAGNOSIS — D0471 Carcinoma in situ of skin of right lower limb, including hip: Secondary | ICD-10-CM | POA: Diagnosis not present

## 2020-03-15 DIAGNOSIS — S8002XA Contusion of left knee, initial encounter: Secondary | ICD-10-CM | POA: Diagnosis not present

## 2020-03-15 DIAGNOSIS — Z6829 Body mass index (BMI) 29.0-29.9, adult: Secondary | ICD-10-CM | POA: Diagnosis not present

## 2020-03-15 DIAGNOSIS — F064 Anxiety disorder due to known physiological condition: Secondary | ICD-10-CM | POA: Diagnosis not present

## 2020-03-15 DIAGNOSIS — R296 Repeated falls: Secondary | ICD-10-CM | POA: Diagnosis not present

## 2020-03-15 DIAGNOSIS — R413 Other amnesia: Secondary | ICD-10-CM | POA: Diagnosis not present

## 2020-03-15 DIAGNOSIS — M069 Rheumatoid arthritis, unspecified: Secondary | ICD-10-CM | POA: Diagnosis not present

## 2020-03-15 DIAGNOSIS — H3093 Unspecified chorioretinal inflammation, bilateral: Secondary | ICD-10-CM | POA: Diagnosis not present

## 2020-03-15 DIAGNOSIS — G458 Other transient cerebral ischemic attacks and related syndromes: Secondary | ICD-10-CM | POA: Diagnosis not present

## 2020-03-15 DIAGNOSIS — M25559 Pain in unspecified hip: Secondary | ICD-10-CM | POA: Diagnosis not present

## 2020-03-15 DIAGNOSIS — R209 Unspecified disturbances of skin sensation: Secondary | ICD-10-CM | POA: Diagnosis not present

## 2020-03-15 DIAGNOSIS — R609 Edema, unspecified: Secondary | ICD-10-CM | POA: Diagnosis not present

## 2020-03-15 DIAGNOSIS — R030 Elevated blood-pressure reading, without diagnosis of hypertension: Secondary | ICD-10-CM | POA: Diagnosis not present

## 2020-03-15 DIAGNOSIS — G4730 Sleep apnea, unspecified: Secondary | ICD-10-CM | POA: Diagnosis not present

## 2020-03-15 DIAGNOSIS — J22 Unspecified acute lower respiratory infection: Secondary | ICD-10-CM | POA: Diagnosis not present

## 2020-03-15 DIAGNOSIS — R062 Wheezing: Secondary | ICD-10-CM | POA: Diagnosis not present

## 2020-03-15 DIAGNOSIS — M1711 Unilateral primary osteoarthritis, right knee: Secondary | ICD-10-CM | POA: Diagnosis not present

## 2020-03-15 DIAGNOSIS — N201 Calculus of ureter: Secondary | ICD-10-CM | POA: Diagnosis not present

## 2020-03-15 DIAGNOSIS — M79675 Pain in left toe(s): Secondary | ICD-10-CM | POA: Diagnosis not present

## 2020-03-15 DIAGNOSIS — H35371 Puckering of macula, right eye: Secondary | ICD-10-CM | POA: Diagnosis not present

## 2020-03-15 DIAGNOSIS — M7541 Impingement syndrome of right shoulder: Secondary | ICD-10-CM | POA: Diagnosis not present

## 2020-03-15 DIAGNOSIS — T8452XD Infection and inflammatory reaction due to internal left hip prosthesis, subsequent encounter: Secondary | ICD-10-CM | POA: Diagnosis not present

## 2020-03-15 DIAGNOSIS — F5001 Anorexia nervosa, restricting type: Secondary | ICD-10-CM | POA: Diagnosis not present

## 2020-03-15 DIAGNOSIS — Z9861 Coronary angioplasty status: Secondary | ICD-10-CM | POA: Diagnosis not present

## 2020-03-15 DIAGNOSIS — H442A2 Degenerative myopia with choroidal neovascularization, left eye: Secondary | ICD-10-CM | POA: Diagnosis not present

## 2020-03-15 DIAGNOSIS — R1110 Vomiting, unspecified: Secondary | ICD-10-CM | POA: Diagnosis not present

## 2020-03-15 DIAGNOSIS — F0390 Unspecified dementia without behavioral disturbance: Secondary | ICD-10-CM | POA: Diagnosis not present

## 2020-03-15 DIAGNOSIS — H35372 Puckering of macula, left eye: Secondary | ICD-10-CM | POA: Diagnosis not present

## 2020-03-15 DIAGNOSIS — M9901 Segmental and somatic dysfunction of cervical region: Secondary | ICD-10-CM | POA: Diagnosis not present

## 2020-03-15 DIAGNOSIS — R5382 Chronic fatigue, unspecified: Secondary | ICD-10-CM | POA: Diagnosis not present

## 2020-03-15 DIAGNOSIS — E781 Pure hyperglyceridemia: Secondary | ICD-10-CM | POA: Diagnosis not present

## 2020-03-15 DIAGNOSIS — I87311 Chronic venous hypertension (idiopathic) with ulcer of right lower extremity: Secondary | ICD-10-CM | POA: Diagnosis not present

## 2020-03-15 DIAGNOSIS — H518 Other specified disorders of binocular movement: Secondary | ICD-10-CM | POA: Diagnosis not present

## 2020-03-15 DIAGNOSIS — I701 Atherosclerosis of renal artery: Secondary | ICD-10-CM | POA: Diagnosis not present

## 2020-03-15 DIAGNOSIS — H353222 Exudative age-related macular degeneration, left eye, with inactive choroidal neovascularization: Secondary | ICD-10-CM | POA: Diagnosis not present

## 2020-03-15 DIAGNOSIS — E0789 Other specified disorders of thyroid: Secondary | ICD-10-CM | POA: Diagnosis not present

## 2020-03-15 DIAGNOSIS — E291 Testicular hypofunction: Secondary | ICD-10-CM | POA: Diagnosis not present

## 2020-03-15 DIAGNOSIS — D485 Neoplasm of uncertain behavior of skin: Secondary | ICD-10-CM | POA: Diagnosis not present

## 2020-03-15 DIAGNOSIS — M06022 Rheumatoid arthritis without rheumatoid factor, left elbow: Secondary | ICD-10-CM | POA: Diagnosis not present

## 2020-03-15 DIAGNOSIS — H9012 Conductive hearing loss, unilateral, left ear, with unrestricted hearing on the contralateral side: Secondary | ICD-10-CM | POA: Diagnosis not present

## 2020-03-15 DIAGNOSIS — Z9911 Dependence on respirator [ventilator] status: Secondary | ICD-10-CM | POA: Diagnosis not present

## 2020-03-15 DIAGNOSIS — R279 Unspecified lack of coordination: Secondary | ICD-10-CM | POA: Diagnosis not present

## 2020-03-15 DIAGNOSIS — C8331 Diffuse large B-cell lymphoma, lymph nodes of head, face, and neck: Secondary | ICD-10-CM | POA: Diagnosis not present

## 2020-03-15 DIAGNOSIS — F4322 Adjustment disorder with anxiety: Secondary | ICD-10-CM | POA: Diagnosis not present

## 2020-03-15 DIAGNOSIS — Z789 Other specified health status: Secondary | ICD-10-CM | POA: Diagnosis not present

## 2020-03-15 DIAGNOSIS — R269 Unspecified abnormalities of gait and mobility: Secondary | ICD-10-CM | POA: Diagnosis not present

## 2020-03-15 DIAGNOSIS — M546 Pain in thoracic spine: Secondary | ICD-10-CM | POA: Diagnosis not present

## 2020-03-15 DIAGNOSIS — K7460 Unspecified cirrhosis of liver: Secondary | ICD-10-CM | POA: Diagnosis not present

## 2020-03-15 DIAGNOSIS — R6521 Severe sepsis with septic shock: Secondary | ICD-10-CM | POA: Diagnosis not present

## 2020-03-15 DIAGNOSIS — I4811 Longstanding persistent atrial fibrillation: Secondary | ICD-10-CM | POA: Diagnosis not present

## 2020-03-15 DIAGNOSIS — E790 Hyperuricemia without signs of inflammatory arthritis and tophaceous disease: Secondary | ICD-10-CM | POA: Diagnosis not present

## 2020-03-15 DIAGNOSIS — Z48810 Encounter for surgical aftercare following surgery on the sense organs: Secondary | ICD-10-CM | POA: Diagnosis not present

## 2020-03-15 DIAGNOSIS — I493 Ventricular premature depolarization: Secondary | ICD-10-CM | POA: Diagnosis not present

## 2020-03-15 DIAGNOSIS — H3581 Retinal edema: Secondary | ICD-10-CM | POA: Diagnosis not present

## 2020-03-15 DIAGNOSIS — R809 Proteinuria, unspecified: Secondary | ICD-10-CM | POA: Diagnosis not present

## 2020-03-15 DIAGNOSIS — M4802 Spinal stenosis, cervical region: Secondary | ICD-10-CM | POA: Diagnosis not present

## 2020-03-15 DIAGNOSIS — R2689 Other abnormalities of gait and mobility: Secondary | ICD-10-CM | POA: Diagnosis not present

## 2020-03-15 DIAGNOSIS — M65879 Other synovitis and tenosynovitis, unspecified ankle and foot: Secondary | ICD-10-CM | POA: Diagnosis not present

## 2020-03-15 DIAGNOSIS — L738 Other specified follicular disorders: Secondary | ICD-10-CM | POA: Diagnosis not present

## 2020-03-15 DIAGNOSIS — I5021 Acute systolic (congestive) heart failure: Secondary | ICD-10-CM | POA: Diagnosis not present

## 2020-03-15 DIAGNOSIS — D509 Iron deficiency anemia, unspecified: Secondary | ICD-10-CM | POA: Diagnosis not present

## 2020-03-15 DIAGNOSIS — H353221 Exudative age-related macular degeneration, left eye, with active choroidal neovascularization: Secondary | ICD-10-CM | POA: Diagnosis not present

## 2020-03-15 DIAGNOSIS — S92352A Displaced fracture of fifth metatarsal bone, left foot, initial encounter for closed fracture: Secondary | ICD-10-CM | POA: Diagnosis not present

## 2020-03-15 DIAGNOSIS — I420 Dilated cardiomyopathy: Secondary | ICD-10-CM | POA: Diagnosis not present

## 2020-03-15 DIAGNOSIS — N6310 Unspecified lump in the right breast, unspecified quadrant: Secondary | ICD-10-CM | POA: Diagnosis not present

## 2020-03-15 DIAGNOSIS — F3132 Bipolar disorder, current episode depressed, moderate: Secondary | ICD-10-CM | POA: Diagnosis not present

## 2020-03-15 DIAGNOSIS — J0181 Other acute recurrent sinusitis: Secondary | ICD-10-CM | POA: Diagnosis not present

## 2020-03-15 DIAGNOSIS — Z961 Presence of intraocular lens: Secondary | ICD-10-CM | POA: Diagnosis not present

## 2020-03-15 DIAGNOSIS — F411 Generalized anxiety disorder: Secondary | ICD-10-CM | POA: Diagnosis not present

## 2020-03-15 DIAGNOSIS — M85832 Other specified disorders of bone density and structure, left forearm: Secondary | ICD-10-CM | POA: Diagnosis not present

## 2020-03-15 DIAGNOSIS — Z96651 Presence of right artificial knee joint: Secondary | ICD-10-CM | POA: Diagnosis not present

## 2020-03-15 DIAGNOSIS — G4752 REM sleep behavior disorder: Secondary | ICD-10-CM | POA: Diagnosis not present

## 2020-03-15 DIAGNOSIS — R531 Weakness: Secondary | ICD-10-CM | POA: Diagnosis not present

## 2020-03-15 DIAGNOSIS — Q666 Other congenital valgus deformities of feet: Secondary | ICD-10-CM | POA: Diagnosis not present

## 2020-03-15 DIAGNOSIS — M4316 Spondylolisthesis, lumbar region: Secondary | ICD-10-CM | POA: Diagnosis not present

## 2020-03-15 DIAGNOSIS — J8410 Pulmonary fibrosis, unspecified: Secondary | ICD-10-CM | POA: Diagnosis not present

## 2020-03-15 DIAGNOSIS — X32XXXS Exposure to sunlight, sequela: Secondary | ICD-10-CM | POA: Diagnosis not present

## 2020-03-15 DIAGNOSIS — C210 Malignant neoplasm of anus, unspecified: Secondary | ICD-10-CM | POA: Diagnosis not present

## 2020-03-15 DIAGNOSIS — L03032 Cellulitis of left toe: Secondary | ICD-10-CM | POA: Diagnosis not present

## 2020-03-15 DIAGNOSIS — N170 Acute kidney failure with tubular necrosis: Secondary | ICD-10-CM | POA: Diagnosis not present

## 2020-03-15 DIAGNOSIS — D6489 Other specified anemias: Secondary | ICD-10-CM | POA: Diagnosis not present

## 2020-03-15 DIAGNOSIS — G621 Alcoholic polyneuropathy: Secondary | ICD-10-CM | POA: Diagnosis not present

## 2020-03-15 DIAGNOSIS — D3132 Benign neoplasm of left choroid: Secondary | ICD-10-CM | POA: Diagnosis not present

## 2020-03-15 DIAGNOSIS — L308 Other specified dermatitis: Secondary | ICD-10-CM | POA: Diagnosis not present

## 2020-03-15 DIAGNOSIS — D696 Thrombocytopenia, unspecified: Secondary | ICD-10-CM | POA: Diagnosis not present

## 2020-03-15 DIAGNOSIS — D72829 Elevated white blood cell count, unspecified: Secondary | ICD-10-CM | POA: Diagnosis not present

## 2020-03-15 DIAGNOSIS — G3184 Mild cognitive impairment, so stated: Secondary | ICD-10-CM | POA: Diagnosis not present

## 2020-03-15 DIAGNOSIS — I43 Cardiomyopathy in diseases classified elsewhere: Secondary | ICD-10-CM | POA: Diagnosis not present

## 2020-03-15 DIAGNOSIS — I82409 Acute embolism and thrombosis of unspecified deep veins of unspecified lower extremity: Secondary | ICD-10-CM | POA: Diagnosis not present

## 2020-03-15 DIAGNOSIS — H25812 Combined forms of age-related cataract, left eye: Secondary | ICD-10-CM | POA: Diagnosis not present

## 2020-03-15 DIAGNOSIS — R011 Cardiac murmur, unspecified: Secondary | ICD-10-CM | POA: Diagnosis not present

## 2020-03-15 DIAGNOSIS — K640 First degree hemorrhoids: Secondary | ICD-10-CM | POA: Diagnosis not present

## 2020-03-15 DIAGNOSIS — I2699 Other pulmonary embolism without acute cor pulmonale: Secondary | ICD-10-CM | POA: Diagnosis not present

## 2020-03-15 DIAGNOSIS — J9611 Chronic respiratory failure with hypoxia: Secondary | ICD-10-CM | POA: Diagnosis not present

## 2020-03-15 DIAGNOSIS — R946 Abnormal results of thyroid function studies: Secondary | ICD-10-CM | POA: Diagnosis not present

## 2020-03-15 DIAGNOSIS — R441 Visual hallucinations: Secondary | ICD-10-CM | POA: Diagnosis not present

## 2020-03-15 DIAGNOSIS — M85852 Other specified disorders of bone density and structure, left thigh: Secondary | ICD-10-CM | POA: Diagnosis not present

## 2020-03-15 DIAGNOSIS — F1410 Cocaine abuse, uncomplicated: Secondary | ICD-10-CM | POA: Diagnosis not present

## 2020-03-15 DIAGNOSIS — D123 Benign neoplasm of transverse colon: Secondary | ICD-10-CM | POA: Diagnosis not present

## 2020-03-15 DIAGNOSIS — H0100A Unspecified blepharitis right eye, upper and lower eyelids: Secondary | ICD-10-CM | POA: Diagnosis not present

## 2020-03-15 DIAGNOSIS — M47812 Spondylosis without myelopathy or radiculopathy, cervical region: Secondary | ICD-10-CM | POA: Diagnosis not present

## 2020-03-15 DIAGNOSIS — K5901 Slow transit constipation: Secondary | ICD-10-CM | POA: Diagnosis not present

## 2020-03-15 DIAGNOSIS — G40409 Other generalized epilepsy and epileptic syndromes, not intractable, without status epilepticus: Secondary | ICD-10-CM | POA: Diagnosis not present

## 2020-03-15 DIAGNOSIS — M5412 Radiculopathy, cervical region: Secondary | ICD-10-CM | POA: Diagnosis not present

## 2020-03-15 DIAGNOSIS — F321 Major depressive disorder, single episode, moderate: Secondary | ICD-10-CM | POA: Diagnosis not present

## 2020-03-15 DIAGNOSIS — I471 Supraventricular tachycardia: Secondary | ICD-10-CM | POA: Diagnosis not present

## 2020-03-15 DIAGNOSIS — I25118 Atherosclerotic heart disease of native coronary artery with other forms of angina pectoris: Secondary | ICD-10-CM | POA: Diagnosis not present

## 2020-03-15 DIAGNOSIS — Z794 Long term (current) use of insulin: Secondary | ICD-10-CM | POA: Diagnosis not present

## 2020-03-15 DIAGNOSIS — L03031 Cellulitis of right toe: Secondary | ICD-10-CM | POA: Diagnosis not present

## 2020-03-15 DIAGNOSIS — E786 Lipoprotein deficiency: Secondary | ICD-10-CM | POA: Diagnosis not present

## 2020-03-15 DIAGNOSIS — R0600 Dyspnea, unspecified: Secondary | ICD-10-CM | POA: Diagnosis not present

## 2020-03-15 DIAGNOSIS — E118 Type 2 diabetes mellitus with unspecified complications: Secondary | ICD-10-CM | POA: Diagnosis not present

## 2020-03-15 DIAGNOSIS — R778 Other specified abnormalities of plasma proteins: Secondary | ICD-10-CM | POA: Diagnosis not present

## 2020-03-15 DIAGNOSIS — I252 Old myocardial infarction: Secondary | ICD-10-CM | POA: Diagnosis not present

## 2020-03-15 DIAGNOSIS — F609 Personality disorder, unspecified: Secondary | ICD-10-CM | POA: Diagnosis not present

## 2020-03-15 DIAGNOSIS — H6123 Impacted cerumen, bilateral: Secondary | ICD-10-CM | POA: Diagnosis not present

## 2020-03-15 DIAGNOSIS — M79672 Pain in left foot: Secondary | ICD-10-CM | POA: Diagnosis not present

## 2020-03-15 DIAGNOSIS — Z01411 Encounter for gynecological examination (general) (routine) with abnormal findings: Secondary | ICD-10-CM | POA: Diagnosis not present

## 2020-03-15 DIAGNOSIS — Z1159 Encounter for screening for other viral diseases: Secondary | ICD-10-CM | POA: Diagnosis not present

## 2020-03-15 DIAGNOSIS — M7632 Iliotibial band syndrome, left leg: Secondary | ICD-10-CM | POA: Diagnosis not present

## 2020-03-15 DIAGNOSIS — J309 Allergic rhinitis, unspecified: Secondary | ICD-10-CM | POA: Diagnosis not present

## 2020-03-15 DIAGNOSIS — R52 Pain, unspecified: Secondary | ICD-10-CM | POA: Diagnosis not present

## 2020-03-15 DIAGNOSIS — G43909 Migraine, unspecified, not intractable, without status migrainosus: Secondary | ICD-10-CM | POA: Diagnosis not present

## 2020-03-15 DIAGNOSIS — H401113 Primary open-angle glaucoma, right eye, severe stage: Secondary | ICD-10-CM | POA: Diagnosis not present

## 2020-03-15 DIAGNOSIS — S52532D Colles' fracture of left radius, subsequent encounter for closed fracture with routine healing: Secondary | ICD-10-CM | POA: Diagnosis not present

## 2020-03-15 DIAGNOSIS — I69320 Aphasia following cerebral infarction: Secondary | ICD-10-CM | POA: Diagnosis not present

## 2020-03-15 DIAGNOSIS — G894 Chronic pain syndrome: Secondary | ICD-10-CM | POA: Diagnosis not present

## 2020-03-15 DIAGNOSIS — H43813 Vitreous degeneration, bilateral: Secondary | ICD-10-CM | POA: Diagnosis not present

## 2020-03-15 DIAGNOSIS — I5030 Unspecified diastolic (congestive) heart failure: Secondary | ICD-10-CM | POA: Diagnosis not present

## 2020-03-15 DIAGNOSIS — I081 Rheumatic disorders of both mitral and tricuspid valves: Secondary | ICD-10-CM | POA: Diagnosis not present

## 2020-03-15 DIAGNOSIS — H353211 Exudative age-related macular degeneration, right eye, with active choroidal neovascularization: Secondary | ICD-10-CM | POA: Diagnosis not present

## 2020-03-15 DIAGNOSIS — D539 Nutritional anemia, unspecified: Secondary | ICD-10-CM | POA: Diagnosis not present

## 2020-03-15 DIAGNOSIS — J4531 Mild persistent asthma with (acute) exacerbation: Secondary | ICD-10-CM | POA: Diagnosis not present

## 2020-03-15 DIAGNOSIS — E1165 Type 2 diabetes mellitus with hyperglycemia: Secondary | ICD-10-CM | POA: Diagnosis not present

## 2020-03-15 DIAGNOSIS — K5903 Drug induced constipation: Secondary | ICD-10-CM | POA: Diagnosis not present

## 2020-03-15 DIAGNOSIS — K920 Hematemesis: Secondary | ICD-10-CM | POA: Diagnosis not present

## 2020-03-15 DIAGNOSIS — Z01419 Encounter for gynecological examination (general) (routine) without abnormal findings: Secondary | ICD-10-CM | POA: Diagnosis not present

## 2020-03-15 DIAGNOSIS — Z881 Allergy status to other antibiotic agents status: Secondary | ICD-10-CM | POA: Diagnosis not present

## 2020-03-15 DIAGNOSIS — J42 Unspecified chronic bronchitis: Secondary | ICD-10-CM | POA: Diagnosis not present

## 2020-03-15 DIAGNOSIS — G629 Polyneuropathy, unspecified: Secondary | ICD-10-CM | POA: Diagnosis not present

## 2020-03-15 DIAGNOSIS — Z0130 Encounter for examination of blood pressure without abnormal findings: Secondary | ICD-10-CM | POA: Diagnosis not present

## 2020-03-15 DIAGNOSIS — F1190 Opioid use, unspecified, uncomplicated: Secondary | ICD-10-CM | POA: Diagnosis not present

## 2020-03-15 DIAGNOSIS — E041 Nontoxic single thyroid nodule: Secondary | ICD-10-CM | POA: Diagnosis not present

## 2020-03-15 DIAGNOSIS — Z6833 Body mass index (BMI) 33.0-33.9, adult: Secondary | ICD-10-CM | POA: Diagnosis not present

## 2020-03-15 DIAGNOSIS — I6322 Cerebral infarction due to unspecified occlusion or stenosis of basilar arteries: Secondary | ICD-10-CM | POA: Diagnosis not present

## 2020-03-15 DIAGNOSIS — F410 Panic disorder [episodic paroxysmal anxiety] without agoraphobia: Secondary | ICD-10-CM | POA: Diagnosis not present

## 2020-03-15 DIAGNOSIS — Z905 Acquired absence of kidney: Secondary | ICD-10-CM | POA: Diagnosis not present

## 2020-03-15 DIAGNOSIS — N049 Nephrotic syndrome with unspecified morphologic changes: Secondary | ICD-10-CM | POA: Diagnosis not present

## 2020-03-15 DIAGNOSIS — E079 Disorder of thyroid, unspecified: Secondary | ICD-10-CM | POA: Diagnosis not present

## 2020-03-15 DIAGNOSIS — M955 Acquired deformity of pelvis: Secondary | ICD-10-CM | POA: Diagnosis not present

## 2020-03-15 DIAGNOSIS — I517 Cardiomegaly: Secondary | ICD-10-CM | POA: Diagnosis not present

## 2020-03-15 DIAGNOSIS — M21619 Bunion of unspecified foot: Secondary | ICD-10-CM | POA: Diagnosis not present

## 2020-03-15 DIAGNOSIS — M5416 Radiculopathy, lumbar region: Secondary | ICD-10-CM | POA: Diagnosis not present

## 2020-03-15 DIAGNOSIS — R112 Nausea with vomiting, unspecified: Secondary | ICD-10-CM | POA: Diagnosis not present

## 2020-03-15 DIAGNOSIS — H401131 Primary open-angle glaucoma, bilateral, mild stage: Secondary | ICD-10-CM | POA: Diagnosis not present

## 2020-03-15 DIAGNOSIS — E113312 Type 2 diabetes mellitus with moderate nonproliferative diabetic retinopathy with macular edema, left eye: Secondary | ICD-10-CM | POA: Diagnosis not present

## 2020-03-15 DIAGNOSIS — M79671 Pain in right foot: Secondary | ICD-10-CM | POA: Diagnosis not present

## 2020-03-15 DIAGNOSIS — D2272 Melanocytic nevi of left lower limb, including hip: Secondary | ICD-10-CM | POA: Diagnosis not present

## 2020-03-15 DIAGNOSIS — H5213 Myopia, bilateral: Secondary | ICD-10-CM | POA: Diagnosis not present

## 2020-03-15 DIAGNOSIS — K6389 Other specified diseases of intestine: Secondary | ICD-10-CM | POA: Diagnosis not present

## 2020-03-15 DIAGNOSIS — Z6831 Body mass index (BMI) 31.0-31.9, adult: Secondary | ICD-10-CM | POA: Diagnosis not present

## 2020-03-15 DIAGNOSIS — E1169 Type 2 diabetes mellitus with other specified complication: Secondary | ICD-10-CM | POA: Diagnosis not present

## 2020-03-15 DIAGNOSIS — Q211 Atrial septal defect: Secondary | ICD-10-CM | POA: Diagnosis not present

## 2020-03-15 DIAGNOSIS — E212 Other hyperparathyroidism: Secondary | ICD-10-CM | POA: Diagnosis not present

## 2020-03-15 DIAGNOSIS — Z87898 Personal history of other specified conditions: Secondary | ICD-10-CM | POA: Diagnosis not present

## 2020-03-15 DIAGNOSIS — M62838 Other muscle spasm: Secondary | ICD-10-CM | POA: Diagnosis not present

## 2020-03-15 DIAGNOSIS — M79676 Pain in unspecified toe(s): Secondary | ICD-10-CM | POA: Diagnosis not present

## 2020-03-15 DIAGNOSIS — R399 Unspecified symptoms and signs involving the genitourinary system: Secondary | ICD-10-CM | POA: Diagnosis not present

## 2020-03-15 DIAGNOSIS — K219 Gastro-esophageal reflux disease without esophagitis: Secondary | ICD-10-CM | POA: Diagnosis not present

## 2020-03-15 DIAGNOSIS — M2560 Stiffness of unspecified joint, not elsewhere classified: Secondary | ICD-10-CM | POA: Diagnosis not present

## 2020-03-15 DIAGNOSIS — Z4789 Encounter for other orthopedic aftercare: Secondary | ICD-10-CM | POA: Diagnosis not present

## 2020-03-15 DIAGNOSIS — N138 Other obstructive and reflux uropathy: Secondary | ICD-10-CM | POA: Diagnosis not present

## 2020-03-15 DIAGNOSIS — E1065 Type 1 diabetes mellitus with hyperglycemia: Secondary | ICD-10-CM | POA: Diagnosis not present

## 2020-03-15 DIAGNOSIS — H401233 Low-tension glaucoma, bilateral, severe stage: Secondary | ICD-10-CM | POA: Diagnosis not present

## 2020-03-15 DIAGNOSIS — Z791 Long term (current) use of non-steroidal anti-inflammatories (NSAID): Secondary | ICD-10-CM | POA: Diagnosis not present

## 2020-03-15 DIAGNOSIS — E113211 Type 2 diabetes mellitus with mild nonproliferative diabetic retinopathy with macular edema, right eye: Secondary | ICD-10-CM | POA: Diagnosis not present

## 2020-03-15 DIAGNOSIS — R82998 Other abnormal findings in urine: Secondary | ICD-10-CM | POA: Diagnosis not present

## 2020-03-15 DIAGNOSIS — S62367A Nondisplaced fracture of neck of fifth metacarpal bone, left hand, initial encounter for closed fracture: Secondary | ICD-10-CM | POA: Diagnosis not present

## 2020-03-15 DIAGNOSIS — E039 Hypothyroidism, unspecified: Secondary | ICD-10-CM | POA: Diagnosis not present

## 2020-03-15 DIAGNOSIS — I499 Cardiac arrhythmia, unspecified: Secondary | ICD-10-CM | POA: Diagnosis not present

## 2020-03-15 DIAGNOSIS — Z86010 Personal history of colonic polyps: Secondary | ICD-10-CM | POA: Diagnosis not present

## 2020-03-15 DIAGNOSIS — M5137 Other intervertebral disc degeneration, lumbosacral region: Secondary | ICD-10-CM | POA: Diagnosis not present

## 2020-03-15 DIAGNOSIS — G4700 Insomnia, unspecified: Secondary | ICD-10-CM | POA: Diagnosis not present

## 2020-03-15 DIAGNOSIS — D61818 Other pancytopenia: Secondary | ICD-10-CM | POA: Diagnosis not present

## 2020-03-15 DIAGNOSIS — Z6826 Body mass index (BMI) 26.0-26.9, adult: Secondary | ICD-10-CM | POA: Diagnosis not present

## 2020-03-15 DIAGNOSIS — J410 Simple chronic bronchitis: Secondary | ICD-10-CM | POA: Diagnosis not present

## 2020-03-15 DIAGNOSIS — G20 Parkinson's disease: Secondary | ICD-10-CM | POA: Diagnosis not present

## 2020-03-15 DIAGNOSIS — H524 Presbyopia: Secondary | ICD-10-CM | POA: Diagnosis not present

## 2020-03-15 DIAGNOSIS — I77811 Abdominal aortic ectasia: Secondary | ICD-10-CM | POA: Diagnosis not present

## 2020-03-15 DIAGNOSIS — H11823 Conjunctivochalasis, bilateral: Secondary | ICD-10-CM | POA: Diagnosis not present

## 2020-03-15 DIAGNOSIS — E1149 Type 2 diabetes mellitus with other diabetic neurological complication: Secondary | ICD-10-CM | POA: Diagnosis not present

## 2020-03-15 DIAGNOSIS — Z886 Allergy status to analgesic agent status: Secondary | ICD-10-CM | POA: Diagnosis not present

## 2020-03-15 DIAGNOSIS — M75111 Incomplete rotator cuff tear or rupture of right shoulder, not specified as traumatic: Secondary | ICD-10-CM | POA: Diagnosis not present

## 2020-03-15 DIAGNOSIS — J301 Allergic rhinitis due to pollen: Secondary | ICD-10-CM | POA: Diagnosis not present

## 2020-03-15 DIAGNOSIS — M79645 Pain in left finger(s): Secondary | ICD-10-CM | POA: Diagnosis not present

## 2020-03-15 DIAGNOSIS — R9389 Abnormal findings on diagnostic imaging of other specified body structures: Secondary | ICD-10-CM | POA: Diagnosis not present

## 2020-03-15 DIAGNOSIS — J8489 Other specified interstitial pulmonary diseases: Secondary | ICD-10-CM | POA: Diagnosis not present

## 2020-03-15 DIAGNOSIS — H15833 Staphyloma posticum, bilateral: Secondary | ICD-10-CM | POA: Diagnosis not present

## 2020-03-15 DIAGNOSIS — G5602 Carpal tunnel syndrome, left upper limb: Secondary | ICD-10-CM | POA: Diagnosis not present

## 2020-03-15 DIAGNOSIS — I6529 Occlusion and stenosis of unspecified carotid artery: Secondary | ICD-10-CM | POA: Diagnosis not present

## 2020-03-15 DIAGNOSIS — I5033 Acute on chronic diastolic (congestive) heart failure: Secondary | ICD-10-CM | POA: Diagnosis not present

## 2020-03-15 DIAGNOSIS — Z87891 Personal history of nicotine dependence: Secondary | ICD-10-CM | POA: Diagnosis not present

## 2020-03-15 DIAGNOSIS — M353 Polymyalgia rheumatica: Secondary | ICD-10-CM | POA: Diagnosis not present

## 2020-03-15 DIAGNOSIS — H0100B Unspecified blepharitis left eye, upper and lower eyelids: Secondary | ICD-10-CM | POA: Diagnosis not present

## 2020-03-15 DIAGNOSIS — H401122 Primary open-angle glaucoma, left eye, moderate stage: Secondary | ICD-10-CM | POA: Diagnosis not present

## 2020-03-15 DIAGNOSIS — Z803 Family history of malignant neoplasm of breast: Secondary | ICD-10-CM | POA: Diagnosis not present

## 2020-03-15 DIAGNOSIS — R05 Cough: Secondary | ICD-10-CM | POA: Diagnosis not present

## 2020-03-15 DIAGNOSIS — M47818 Spondylosis without myelopathy or radiculopathy, sacral and sacrococcygeal region: Secondary | ICD-10-CM | POA: Diagnosis not present

## 2020-03-15 DIAGNOSIS — M1712 Unilateral primary osteoarthritis, left knee: Secondary | ICD-10-CM | POA: Diagnosis not present

## 2020-03-15 DIAGNOSIS — M961 Postlaminectomy syndrome, not elsewhere classified: Secondary | ICD-10-CM | POA: Diagnosis not present

## 2020-03-15 DIAGNOSIS — D6851 Activated protein C resistance: Secondary | ICD-10-CM | POA: Diagnosis not present

## 2020-03-15 DIAGNOSIS — N183 Chronic kidney disease, stage 3 unspecified: Secondary | ICD-10-CM | POA: Diagnosis not present

## 2020-03-15 DIAGNOSIS — L718 Other rosacea: Secondary | ICD-10-CM | POA: Diagnosis not present

## 2020-03-15 DIAGNOSIS — I8311 Varicose veins of right lower extremity with inflammation: Secondary | ICD-10-CM | POA: Diagnosis not present

## 2020-03-15 DIAGNOSIS — G959 Disease of spinal cord, unspecified: Secondary | ICD-10-CM | POA: Diagnosis not present

## 2020-03-15 DIAGNOSIS — Z6834 Body mass index (BMI) 34.0-34.9, adult: Secondary | ICD-10-CM | POA: Diagnosis not present

## 2020-03-15 DIAGNOSIS — M5010 Cervical disc disorder with radiculopathy, unspecified cervical region: Secondary | ICD-10-CM | POA: Diagnosis not present

## 2020-03-15 DIAGNOSIS — I83812 Varicose veins of left lower extremities with pain: Secondary | ICD-10-CM | POA: Diagnosis not present

## 2020-03-15 DIAGNOSIS — M9904 Segmental and somatic dysfunction of sacral region: Secondary | ICD-10-CM | POA: Diagnosis not present

## 2020-03-15 DIAGNOSIS — G603 Idiopathic progressive neuropathy: Secondary | ICD-10-CM | POA: Diagnosis not present

## 2020-03-15 DIAGNOSIS — T80818A Extravasation of other vesicant agent, initial encounter: Secondary | ICD-10-CM | POA: Diagnosis not present

## 2020-03-15 DIAGNOSIS — N3020 Other chronic cystitis without hematuria: Secondary | ICD-10-CM | POA: Diagnosis not present

## 2020-03-15 DIAGNOSIS — N134 Hydroureter: Secondary | ICD-10-CM | POA: Diagnosis not present

## 2020-03-15 DIAGNOSIS — M1A00X0 Idiopathic chronic gout, unspecified site, without tophus (tophi): Secondary | ICD-10-CM | POA: Diagnosis not present

## 2020-03-15 DIAGNOSIS — M4726 Other spondylosis with radiculopathy, lumbar region: Secondary | ICD-10-CM | POA: Diagnosis not present

## 2020-03-15 DIAGNOSIS — I482 Chronic atrial fibrillation, unspecified: Secondary | ICD-10-CM | POA: Diagnosis not present

## 2020-03-15 DIAGNOSIS — R270 Ataxia, unspecified: Secondary | ICD-10-CM | POA: Diagnosis not present

## 2020-03-15 DIAGNOSIS — Z820 Family history of epilepsy and other diseases of the nervous system: Secondary | ICD-10-CM | POA: Diagnosis not present

## 2020-03-15 DIAGNOSIS — S0003XA Contusion of scalp, initial encounter: Secondary | ICD-10-CM | POA: Diagnosis not present

## 2020-03-15 DIAGNOSIS — M4710 Other spondylosis with myelopathy, site unspecified: Secondary | ICD-10-CM | POA: Diagnosis not present

## 2020-03-15 DIAGNOSIS — K7030 Alcoholic cirrhosis of liver without ascites: Secondary | ICD-10-CM | POA: Diagnosis not present

## 2020-03-15 DIAGNOSIS — S46011D Strain of muscle(s) and tendon(s) of the rotator cuff of right shoulder, subsequent encounter: Secondary | ICD-10-CM | POA: Diagnosis not present

## 2020-03-15 DIAGNOSIS — M9902 Segmental and somatic dysfunction of thoracic region: Secondary | ICD-10-CM | POA: Diagnosis not present

## 2020-03-15 DIAGNOSIS — M25661 Stiffness of right knee, not elsewhere classified: Secondary | ICD-10-CM | POA: Diagnosis not present

## 2020-03-15 DIAGNOSIS — L858 Other specified epidermal thickening: Secondary | ICD-10-CM | POA: Diagnosis not present

## 2020-03-15 DIAGNOSIS — G35 Multiple sclerosis: Secondary | ICD-10-CM | POA: Diagnosis not present

## 2020-03-15 DIAGNOSIS — D6869 Other thrombophilia: Secondary | ICD-10-CM | POA: Diagnosis not present

## 2020-03-15 DIAGNOSIS — Z08 Encounter for follow-up examination after completed treatment for malignant neoplasm: Secondary | ICD-10-CM | POA: Diagnosis not present

## 2020-03-15 DIAGNOSIS — M5441 Lumbago with sciatica, right side: Secondary | ICD-10-CM | POA: Diagnosis not present

## 2020-03-15 DIAGNOSIS — D122 Benign neoplasm of ascending colon: Secondary | ICD-10-CM | POA: Diagnosis not present

## 2020-03-15 DIAGNOSIS — I4821 Permanent atrial fibrillation: Secondary | ICD-10-CM | POA: Diagnosis not present

## 2020-03-15 DIAGNOSIS — K649 Unspecified hemorrhoids: Secondary | ICD-10-CM | POA: Diagnosis not present

## 2020-03-15 DIAGNOSIS — I719 Aortic aneurysm of unspecified site, without rupture: Secondary | ICD-10-CM | POA: Diagnosis not present

## 2020-03-15 DIAGNOSIS — N319 Neuromuscular dysfunction of bladder, unspecified: Secondary | ICD-10-CM | POA: Diagnosis not present

## 2020-03-15 DIAGNOSIS — F259 Schizoaffective disorder, unspecified: Secondary | ICD-10-CM | POA: Diagnosis not present

## 2020-03-15 DIAGNOSIS — H401121 Primary open-angle glaucoma, left eye, mild stage: Secondary | ICD-10-CM | POA: Diagnosis not present

## 2020-03-15 DIAGNOSIS — J45909 Unspecified asthma, uncomplicated: Secondary | ICD-10-CM | POA: Diagnosis not present

## 2020-03-15 DIAGNOSIS — H9193 Unspecified hearing loss, bilateral: Secondary | ICD-10-CM | POA: Diagnosis not present

## 2020-03-15 DIAGNOSIS — F330 Major depressive disorder, recurrent, mild: Secondary | ICD-10-CM | POA: Diagnosis not present

## 2020-03-15 DIAGNOSIS — C4A9 Merkel cell carcinoma, unspecified: Secondary | ICD-10-CM | POA: Diagnosis not present

## 2020-03-15 DIAGNOSIS — H04123 Dry eye syndrome of bilateral lacrimal glands: Secondary | ICD-10-CM | POA: Diagnosis not present

## 2020-03-15 DIAGNOSIS — I472 Ventricular tachycardia: Secondary | ICD-10-CM | POA: Diagnosis not present

## 2020-03-15 DIAGNOSIS — Z86711 Personal history of pulmonary embolism: Secondary | ICD-10-CM | POA: Diagnosis not present

## 2020-03-15 DIAGNOSIS — T451X5S Adverse effect of antineoplastic and immunosuppressive drugs, sequela: Secondary | ICD-10-CM | POA: Diagnosis not present

## 2020-03-15 DIAGNOSIS — I491 Atrial premature depolarization: Secondary | ICD-10-CM | POA: Diagnosis not present

## 2020-03-15 DIAGNOSIS — C3411 Malignant neoplasm of upper lobe, right bronchus or lung: Secondary | ICD-10-CM | POA: Diagnosis not present

## 2020-03-15 DIAGNOSIS — M7552 Bursitis of left shoulder: Secondary | ICD-10-CM | POA: Diagnosis not present

## 2020-03-15 DIAGNOSIS — M1000 Idiopathic gout, unspecified site: Secondary | ICD-10-CM | POA: Diagnosis not present

## 2020-03-15 DIAGNOSIS — C50812 Malignant neoplasm of overlapping sites of left female breast: Secondary | ICD-10-CM | POA: Diagnosis not present

## 2020-03-15 DIAGNOSIS — L03116 Cellulitis of left lower limb: Secondary | ICD-10-CM | POA: Diagnosis not present

## 2020-03-15 DIAGNOSIS — S329XXA Fracture of unspecified parts of lumbosacral spine and pelvis, initial encounter for closed fracture: Secondary | ICD-10-CM | POA: Diagnosis not present

## 2020-03-15 DIAGNOSIS — H906 Mixed conductive and sensorineural hearing loss, bilateral: Secondary | ICD-10-CM | POA: Diagnosis not present

## 2020-03-15 DIAGNOSIS — R200 Anesthesia of skin: Secondary | ICD-10-CM | POA: Diagnosis not present

## 2020-03-15 DIAGNOSIS — R300 Dysuria: Secondary | ICD-10-CM | POA: Diagnosis not present

## 2020-03-15 DIAGNOSIS — M1611 Unilateral primary osteoarthritis, right hip: Secondary | ICD-10-CM | POA: Diagnosis not present

## 2020-03-15 DIAGNOSIS — R911 Solitary pulmonary nodule: Secondary | ICD-10-CM | POA: Diagnosis not present

## 2020-03-15 DIAGNOSIS — M25522 Pain in left elbow: Secondary | ICD-10-CM | POA: Diagnosis not present

## 2020-03-15 DIAGNOSIS — M25519 Pain in unspecified shoulder: Secondary | ICD-10-CM | POA: Diagnosis not present

## 2020-03-15 DIAGNOSIS — S0512XA Contusion of eyeball and orbital tissues, left eye, initial encounter: Secondary | ICD-10-CM | POA: Diagnosis not present

## 2020-03-15 DIAGNOSIS — K130 Diseases of lips: Secondary | ICD-10-CM | POA: Diagnosis not present

## 2020-03-15 DIAGNOSIS — S338XXA Sprain of other parts of lumbar spine and pelvis, initial encounter: Secondary | ICD-10-CM | POA: Diagnosis not present

## 2020-03-15 DIAGNOSIS — H2511 Age-related nuclear cataract, right eye: Secondary | ICD-10-CM | POA: Diagnosis not present

## 2020-03-15 DIAGNOSIS — Z8673 Personal history of transient ischemic attack (TIA), and cerebral infarction without residual deficits: Secondary | ICD-10-CM | POA: Diagnosis not present

## 2020-03-15 DIAGNOSIS — H0288A Meibomian gland dysfunction right eye, upper and lower eyelids: Secondary | ICD-10-CM | POA: Diagnosis not present

## 2020-03-15 DIAGNOSIS — H02202 Unspecified lagophthalmos right lower eyelid: Secondary | ICD-10-CM | POA: Diagnosis not present

## 2020-03-15 DIAGNOSIS — H40003 Preglaucoma, unspecified, bilateral: Secondary | ICD-10-CM | POA: Diagnosis not present

## 2020-03-15 DIAGNOSIS — H52203 Unspecified astigmatism, bilateral: Secondary | ICD-10-CM | POA: Diagnosis not present

## 2020-03-15 DIAGNOSIS — C3492 Malignant neoplasm of unspecified part of left bronchus or lung: Secondary | ICD-10-CM | POA: Diagnosis not present

## 2020-03-15 DIAGNOSIS — D692 Other nonthrombocytopenic purpura: Secondary | ICD-10-CM | POA: Diagnosis not present

## 2020-03-15 DIAGNOSIS — D239 Other benign neoplasm of skin, unspecified: Secondary | ICD-10-CM | POA: Diagnosis not present

## 2020-03-15 DIAGNOSIS — H0288B Meibomian gland dysfunction left eye, upper and lower eyelids: Secondary | ICD-10-CM | POA: Diagnosis not present

## 2020-03-15 DIAGNOSIS — E1159 Type 2 diabetes mellitus with other circulatory complications: Secondary | ICD-10-CM | POA: Diagnosis not present

## 2020-03-15 DIAGNOSIS — B353 Tinea pedis: Secondary | ICD-10-CM | POA: Diagnosis not present

## 2020-03-15 DIAGNOSIS — R0989 Other specified symptoms and signs involving the circulatory and respiratory systems: Secondary | ICD-10-CM | POA: Diagnosis not present

## 2020-03-15 DIAGNOSIS — H6121 Impacted cerumen, right ear: Secondary | ICD-10-CM | POA: Diagnosis not present

## 2020-03-15 DIAGNOSIS — C50919 Malignant neoplasm of unspecified site of unspecified female breast: Secondary | ICD-10-CM | POA: Diagnosis not present

## 2020-03-15 DIAGNOSIS — L03211 Cellulitis of face: Secondary | ICD-10-CM | POA: Diagnosis not present

## 2020-03-15 DIAGNOSIS — R918 Other nonspecific abnormal finding of lung field: Secondary | ICD-10-CM | POA: Diagnosis not present

## 2020-03-15 DIAGNOSIS — Z20822 Contact with and (suspected) exposure to covid-19: Secondary | ICD-10-CM | POA: Diagnosis not present

## 2020-03-15 DIAGNOSIS — W19XXXA Unspecified fall, initial encounter: Secondary | ICD-10-CM | POA: Diagnosis not present

## 2020-03-15 DIAGNOSIS — J3800 Paralysis of vocal cords and larynx, unspecified: Secondary | ICD-10-CM | POA: Diagnosis not present

## 2020-03-15 DIAGNOSIS — Z8669 Personal history of other diseases of the nervous system and sense organs: Secondary | ICD-10-CM | POA: Diagnosis not present

## 2020-03-15 DIAGNOSIS — R5381 Other malaise: Secondary | ICD-10-CM | POA: Diagnosis not present

## 2020-03-15 DIAGNOSIS — E0520 Thyrotoxicosis with toxic multinodular goiter without thyrotoxic crisis or storm: Secondary | ICD-10-CM | POA: Diagnosis not present

## 2020-03-15 DIAGNOSIS — Z1331 Encounter for screening for depression: Secondary | ICD-10-CM | POA: Diagnosis not present

## 2020-03-15 DIAGNOSIS — M5134 Other intervertebral disc degeneration, thoracic region: Secondary | ICD-10-CM | POA: Diagnosis not present

## 2020-03-15 DIAGNOSIS — I7101 Dissection of thoracic aorta: Secondary | ICD-10-CM | POA: Diagnosis not present

## 2020-03-15 DIAGNOSIS — R202 Paresthesia of skin: Secondary | ICD-10-CM | POA: Diagnosis not present

## 2020-03-15 DIAGNOSIS — I208 Other forms of angina pectoris: Secondary | ICD-10-CM | POA: Diagnosis not present

## 2020-03-15 DIAGNOSIS — Z114 Encounter for screening for human immunodeficiency virus [HIV]: Secondary | ICD-10-CM | POA: Diagnosis not present

## 2020-03-15 DIAGNOSIS — H00015 Hordeolum externum left lower eyelid: Secondary | ICD-10-CM | POA: Diagnosis not present

## 2020-03-15 DIAGNOSIS — F17210 Nicotine dependence, cigarettes, uncomplicated: Secondary | ICD-10-CM | POA: Diagnosis not present

## 2020-03-15 DIAGNOSIS — H9192 Unspecified hearing loss, left ear: Secondary | ICD-10-CM | POA: Diagnosis not present

## 2020-03-15 DIAGNOSIS — I9589 Other hypotension: Secondary | ICD-10-CM | POA: Diagnosis not present

## 2020-03-15 DIAGNOSIS — I5043 Acute on chronic combined systolic (congestive) and diastolic (congestive) heart failure: Secondary | ICD-10-CM | POA: Diagnosis not present

## 2020-03-15 DIAGNOSIS — M170 Bilateral primary osteoarthritis of knee: Secondary | ICD-10-CM | POA: Diagnosis not present

## 2020-03-15 DIAGNOSIS — M79674 Pain in right toe(s): Secondary | ICD-10-CM | POA: Diagnosis not present

## 2020-03-15 DIAGNOSIS — H16223 Keratoconjunctivitis sicca, not specified as Sjogren's, bilateral: Secondary | ICD-10-CM | POA: Diagnosis not present

## 2020-03-15 DIAGNOSIS — D649 Anemia, unspecified: Secondary | ICD-10-CM | POA: Diagnosis not present

## 2020-03-15 DIAGNOSIS — N400 Enlarged prostate without lower urinary tract symptoms: Secondary | ICD-10-CM | POA: Diagnosis not present

## 2020-03-15 DIAGNOSIS — I5032 Chronic diastolic (congestive) heart failure: Secondary | ICD-10-CM | POA: Diagnosis not present

## 2020-03-15 DIAGNOSIS — R590 Localized enlarged lymph nodes: Secondary | ICD-10-CM | POA: Diagnosis not present

## 2020-03-15 DIAGNOSIS — T1512XA Foreign body in conjunctival sac, left eye, initial encounter: Secondary | ICD-10-CM | POA: Diagnosis not present

## 2020-03-15 DIAGNOSIS — R001 Bradycardia, unspecified: Secondary | ICD-10-CM | POA: Diagnosis not present

## 2020-03-15 DIAGNOSIS — Z09 Encounter for follow-up examination after completed treatment for conditions other than malignant neoplasm: Secondary | ICD-10-CM | POA: Diagnosis not present

## 2020-03-15 DIAGNOSIS — M47817 Spondylosis without myelopathy or radiculopathy, lumbosacral region: Secondary | ICD-10-CM | POA: Diagnosis not present

## 2020-03-15 DIAGNOSIS — N3281 Overactive bladder: Secondary | ICD-10-CM | POA: Diagnosis not present

## 2020-03-15 DIAGNOSIS — I480 Paroxysmal atrial fibrillation: Secondary | ICD-10-CM | POA: Diagnosis not present

## 2020-03-15 DIAGNOSIS — E109 Type 1 diabetes mellitus without complications: Secondary | ICD-10-CM | POA: Diagnosis not present

## 2020-03-15 DIAGNOSIS — R251 Tremor, unspecified: Secondary | ICD-10-CM | POA: Diagnosis not present

## 2020-03-15 DIAGNOSIS — D132 Benign neoplasm of duodenum: Secondary | ICD-10-CM | POA: Diagnosis not present

## 2020-03-15 DIAGNOSIS — C50411 Malignant neoplasm of upper-outer quadrant of right female breast: Secondary | ICD-10-CM | POA: Diagnosis not present

## 2020-03-15 DIAGNOSIS — R61 Generalized hyperhidrosis: Secondary | ICD-10-CM | POA: Diagnosis not present

## 2020-03-15 DIAGNOSIS — D497 Neoplasm of unspecified behavior of endocrine glands and other parts of nervous system: Secondary | ICD-10-CM | POA: Diagnosis not present

## 2020-03-15 DIAGNOSIS — M549 Dorsalgia, unspecified: Secondary | ICD-10-CM | POA: Diagnosis not present

## 2020-03-15 DIAGNOSIS — F430 Acute stress reaction: Secondary | ICD-10-CM | POA: Diagnosis not present

## 2020-03-15 DIAGNOSIS — M159 Polyosteoarthritis, unspecified: Secondary | ICD-10-CM | POA: Diagnosis not present

## 2020-03-15 DIAGNOSIS — F325 Major depressive disorder, single episode, in full remission: Secondary | ICD-10-CM | POA: Diagnosis not present

## 2020-03-15 DIAGNOSIS — M85851 Other specified disorders of bone density and structure, right thigh: Secondary | ICD-10-CM | POA: Diagnosis not present

## 2020-03-15 DIAGNOSIS — N951 Menopausal and female climacteric states: Secondary | ICD-10-CM | POA: Diagnosis not present

## 2020-03-15 DIAGNOSIS — G510 Bell's palsy: Secondary | ICD-10-CM | POA: Diagnosis not present

## 2020-03-15 DIAGNOSIS — G8190 Hemiplegia, unspecified affecting unspecified side: Secondary | ICD-10-CM | POA: Diagnosis not present

## 2020-03-15 DIAGNOSIS — F4310 Post-traumatic stress disorder, unspecified: Secondary | ICD-10-CM | POA: Diagnosis not present

## 2020-03-15 DIAGNOSIS — C661 Malignant neoplasm of right ureter: Secondary | ICD-10-CM | POA: Diagnosis not present

## 2020-03-15 DIAGNOSIS — Z9221 Personal history of antineoplastic chemotherapy: Secondary | ICD-10-CM | POA: Diagnosis not present

## 2020-03-15 DIAGNOSIS — K449 Diaphragmatic hernia without obstruction or gangrene: Secondary | ICD-10-CM | POA: Diagnosis not present

## 2020-03-15 DIAGNOSIS — L97822 Non-pressure chronic ulcer of other part of left lower leg with fat layer exposed: Secondary | ICD-10-CM | POA: Diagnosis not present

## 2020-03-15 DIAGNOSIS — I723 Aneurysm of iliac artery: Secondary | ICD-10-CM | POA: Diagnosis not present

## 2020-03-15 DIAGNOSIS — Z7902 Long term (current) use of antithrombotics/antiplatelets: Secondary | ICD-10-CM | POA: Diagnosis not present

## 2020-03-15 DIAGNOSIS — H353122 Nonexudative age-related macular degeneration, left eye, intermediate dry stage: Secondary | ICD-10-CM | POA: Diagnosis not present

## 2020-03-15 DIAGNOSIS — L814 Other melanin hyperpigmentation: Secondary | ICD-10-CM | POA: Diagnosis not present

## 2020-03-15 DIAGNOSIS — S8012XA Contusion of left lower leg, initial encounter: Secondary | ICD-10-CM | POA: Diagnosis not present

## 2020-03-15 DIAGNOSIS — J9811 Atelectasis: Secondary | ICD-10-CM | POA: Diagnosis not present

## 2020-03-15 DIAGNOSIS — C786 Secondary malignant neoplasm of retroperitoneum and peritoneum: Secondary | ICD-10-CM | POA: Diagnosis not present

## 2020-03-15 DIAGNOSIS — M7062 Trochanteric bursitis, left hip: Secondary | ICD-10-CM | POA: Diagnosis not present

## 2020-03-15 DIAGNOSIS — Z1283 Encounter for screening for malignant neoplasm of skin: Secondary | ICD-10-CM | POA: Diagnosis not present

## 2020-03-15 DIAGNOSIS — N529 Male erectile dysfunction, unspecified: Secondary | ICD-10-CM | POA: Diagnosis not present

## 2020-03-15 DIAGNOSIS — M25511 Pain in right shoulder: Secondary | ICD-10-CM | POA: Diagnosis not present

## 2020-03-15 DIAGNOSIS — M542 Cervicalgia: Secondary | ICD-10-CM | POA: Diagnosis not present

## 2020-03-15 DIAGNOSIS — I25110 Atherosclerotic heart disease of native coronary artery with unstable angina pectoris: Secondary | ICD-10-CM | POA: Diagnosis not present

## 2020-03-15 DIAGNOSIS — K5791 Diverticulosis of intestine, part unspecified, without perforation or abscess with bleeding: Secondary | ICD-10-CM | POA: Diagnosis not present

## 2020-03-15 DIAGNOSIS — I209 Angina pectoris, unspecified: Secondary | ICD-10-CM | POA: Diagnosis not present

## 2020-03-15 DIAGNOSIS — D638 Anemia in other chronic diseases classified elsewhere: Secondary | ICD-10-CM | POA: Diagnosis not present

## 2020-03-15 DIAGNOSIS — F439 Reaction to severe stress, unspecified: Secondary | ICD-10-CM | POA: Diagnosis not present

## 2020-03-15 DIAGNOSIS — N2581 Secondary hyperparathyroidism of renal origin: Secondary | ICD-10-CM | POA: Diagnosis not present

## 2020-03-15 DIAGNOSIS — I69390 Apraxia following cerebral infarction: Secondary | ICD-10-CM | POA: Diagnosis not present

## 2020-03-15 DIAGNOSIS — Z800 Family history of malignant neoplasm of digestive organs: Secondary | ICD-10-CM | POA: Diagnosis not present

## 2020-03-15 DIAGNOSIS — F0280 Dementia in other diseases classified elsewhere without behavioral disturbance: Secondary | ICD-10-CM | POA: Diagnosis not present

## 2020-03-15 DIAGNOSIS — F17201 Nicotine dependence, unspecified, in remission: Secondary | ICD-10-CM | POA: Diagnosis not present

## 2020-03-15 DIAGNOSIS — G301 Alzheimer's disease with late onset: Secondary | ICD-10-CM | POA: Diagnosis not present

## 2020-03-15 DIAGNOSIS — M329 Systemic lupus erythematosus, unspecified: Secondary | ICD-10-CM | POA: Diagnosis not present

## 2020-03-15 DIAGNOSIS — L97812 Non-pressure chronic ulcer of other part of right lower leg with fat layer exposed: Secondary | ICD-10-CM | POA: Diagnosis not present

## 2020-03-15 DIAGNOSIS — M4716 Other spondylosis with myelopathy, lumbar region: Secondary | ICD-10-CM | POA: Diagnosis not present

## 2020-03-15 DIAGNOSIS — H52223 Regular astigmatism, bilateral: Secondary | ICD-10-CM | POA: Diagnosis not present

## 2020-03-15 DIAGNOSIS — M5136 Other intervertebral disc degeneration, lumbar region: Secondary | ICD-10-CM | POA: Diagnosis not present

## 2020-03-15 DIAGNOSIS — M7742 Metatarsalgia, left foot: Secondary | ICD-10-CM | POA: Diagnosis not present

## 2020-03-15 DIAGNOSIS — E038 Other specified hypothyroidism: Secondary | ICD-10-CM | POA: Diagnosis not present

## 2020-03-15 DIAGNOSIS — A419 Sepsis, unspecified organism: Secondary | ICD-10-CM | POA: Diagnosis not present

## 2020-03-15 DIAGNOSIS — Z13220 Encounter for screening for lipoid disorders: Secondary | ICD-10-CM | POA: Diagnosis not present

## 2020-03-15 DIAGNOSIS — M530 Cervicocranial syndrome: Secondary | ICD-10-CM | POA: Diagnosis not present

## 2020-03-15 DIAGNOSIS — I129 Hypertensive chronic kidney disease with stage 1 through stage 4 chronic kidney disease, or unspecified chronic kidney disease: Secondary | ICD-10-CM | POA: Diagnosis not present

## 2020-03-15 DIAGNOSIS — L84 Corns and callosities: Secondary | ICD-10-CM | POA: Diagnosis not present

## 2020-03-15 DIAGNOSIS — N184 Chronic kidney disease, stage 4 (severe): Secondary | ICD-10-CM | POA: Diagnosis not present

## 2020-03-15 DIAGNOSIS — M12812 Other specific arthropathies, not elsewhere classified, left shoulder: Secondary | ICD-10-CM | POA: Diagnosis not present

## 2020-03-15 DIAGNOSIS — E7800 Pure hypercholesterolemia, unspecified: Secondary | ICD-10-CM | POA: Diagnosis not present

## 2020-03-15 DIAGNOSIS — M8589 Other specified disorders of bone density and structure, multiple sites: Secondary | ICD-10-CM | POA: Diagnosis not present

## 2020-03-15 DIAGNOSIS — C50912 Malignant neoplasm of unspecified site of left female breast: Secondary | ICD-10-CM | POA: Diagnosis not present

## 2020-03-15 DIAGNOSIS — M5126 Other intervertebral disc displacement, lumbar region: Secondary | ICD-10-CM | POA: Diagnosis not present

## 2020-03-15 DIAGNOSIS — J302 Other seasonal allergic rhinitis: Secondary | ICD-10-CM | POA: Diagnosis not present

## 2020-03-15 DIAGNOSIS — F319 Bipolar disorder, unspecified: Secondary | ICD-10-CM | POA: Diagnosis not present

## 2020-03-15 DIAGNOSIS — Z8659 Personal history of other mental and behavioral disorders: Secondary | ICD-10-CM | POA: Diagnosis not present

## 2020-03-15 DIAGNOSIS — R0902 Hypoxemia: Secondary | ICD-10-CM | POA: Diagnosis not present

## 2020-03-15 DIAGNOSIS — E11319 Type 2 diabetes mellitus with unspecified diabetic retinopathy without macular edema: Secondary | ICD-10-CM | POA: Diagnosis not present

## 2020-03-15 DIAGNOSIS — H353121 Nonexudative age-related macular degeneration, left eye, early dry stage: Secondary | ICD-10-CM | POA: Diagnosis not present

## 2020-03-15 DIAGNOSIS — F329 Major depressive disorder, single episode, unspecified: Secondary | ICD-10-CM | POA: Diagnosis not present

## 2020-03-15 DIAGNOSIS — Z96642 Presence of left artificial hip joint: Secondary | ICD-10-CM | POA: Diagnosis not present

## 2020-03-15 DIAGNOSIS — Z0000 Encounter for general adult medical examination without abnormal findings: Secondary | ICD-10-CM | POA: Diagnosis not present

## 2020-03-15 DIAGNOSIS — I469 Cardiac arrest, cause unspecified: Secondary | ICD-10-CM | POA: Diagnosis not present

## 2020-03-15 DIAGNOSIS — E349 Endocrine disorder, unspecified: Secondary | ICD-10-CM | POA: Diagnosis not present

## 2020-03-15 DIAGNOSIS — M5414 Radiculopathy, thoracic region: Secondary | ICD-10-CM | POA: Diagnosis not present

## 2020-03-15 DIAGNOSIS — L719 Rosacea, unspecified: Secondary | ICD-10-CM | POA: Diagnosis not present

## 2020-03-15 DIAGNOSIS — N1831 Chronic kidney disease, stage 3a: Secondary | ICD-10-CM | POA: Diagnosis not present

## 2020-03-15 DIAGNOSIS — M8000XD Age-related osteoporosis with current pathological fracture, unspecified site, subsequent encounter for fracture with routine healing: Secondary | ICD-10-CM | POA: Diagnosis not present

## 2020-03-15 DIAGNOSIS — R1031 Right lower quadrant pain: Secondary | ICD-10-CM | POA: Diagnosis not present

## 2020-03-15 DIAGNOSIS — F320 Major depressive disorder, single episode, mild: Secondary | ICD-10-CM | POA: Diagnosis not present

## 2020-03-15 DIAGNOSIS — M7741 Metatarsalgia, right foot: Secondary | ICD-10-CM | POA: Diagnosis not present

## 2020-03-15 DIAGNOSIS — R310 Gross hematuria: Secondary | ICD-10-CM | POA: Diagnosis not present

## 2020-03-15 DIAGNOSIS — J90 Pleural effusion, not elsewhere classified: Secondary | ICD-10-CM | POA: Diagnosis not present

## 2020-03-15 DIAGNOSIS — R292 Abnormal reflex: Secondary | ICD-10-CM | POA: Diagnosis not present

## 2020-03-15 DIAGNOSIS — L720 Epidermal cyst: Secondary | ICD-10-CM | POA: Diagnosis not present

## 2020-03-15 DIAGNOSIS — E538 Deficiency of other specified B group vitamins: Secondary | ICD-10-CM | POA: Diagnosis not present

## 2020-03-15 DIAGNOSIS — I4891 Unspecified atrial fibrillation: Secondary | ICD-10-CM | POA: Diagnosis not present

## 2020-03-15 DIAGNOSIS — M25531 Pain in right wrist: Secondary | ICD-10-CM | POA: Diagnosis not present

## 2020-03-15 DIAGNOSIS — M79602 Pain in left arm: Secondary | ICD-10-CM | POA: Diagnosis not present

## 2020-03-15 DIAGNOSIS — I4892 Unspecified atrial flutter: Secondary | ICD-10-CM | POA: Diagnosis not present

## 2020-03-15 DIAGNOSIS — Z974 Presence of external hearing-aid: Secondary | ICD-10-CM | POA: Diagnosis not present

## 2020-03-15 DIAGNOSIS — R002 Palpitations: Secondary | ICD-10-CM | POA: Diagnosis not present

## 2020-03-15 DIAGNOSIS — M5106 Intervertebral disc disorders with myelopathy, lumbar region: Secondary | ICD-10-CM | POA: Diagnosis not present

## 2020-03-15 DIAGNOSIS — F17200 Nicotine dependence, unspecified, uncomplicated: Secondary | ICD-10-CM | POA: Diagnosis not present

## 2020-03-15 DIAGNOSIS — M0579 Rheumatoid arthritis with rheumatoid factor of multiple sites without organ or systems involvement: Secondary | ICD-10-CM | POA: Diagnosis not present

## 2020-03-15 DIAGNOSIS — Z7952 Long term (current) use of systemic steroids: Secondary | ICD-10-CM | POA: Diagnosis not present

## 2020-03-15 DIAGNOSIS — F4321 Adjustment disorder with depressed mood: Secondary | ICD-10-CM | POA: Diagnosis not present

## 2020-03-15 DIAGNOSIS — I359 Nonrheumatic aortic valve disorder, unspecified: Secondary | ICD-10-CM | POA: Diagnosis not present

## 2020-03-15 DIAGNOSIS — R1310 Dysphagia, unspecified: Secondary | ICD-10-CM | POA: Diagnosis not present

## 2020-03-15 DIAGNOSIS — R072 Precordial pain: Secondary | ICD-10-CM | POA: Diagnosis not present

## 2020-03-15 DIAGNOSIS — C641 Malignant neoplasm of right kidney, except renal pelvis: Secondary | ICD-10-CM | POA: Diagnosis not present

## 2020-03-15 DIAGNOSIS — N2889 Other specified disorders of kidney and ureter: Secondary | ICD-10-CM | POA: Diagnosis not present

## 2020-03-15 DIAGNOSIS — Z8701 Personal history of pneumonia (recurrent): Secondary | ICD-10-CM | POA: Diagnosis not present

## 2020-03-15 DIAGNOSIS — H33301 Unspecified retinal break, right eye: Secondary | ICD-10-CM | POA: Diagnosis not present

## 2020-03-15 DIAGNOSIS — L700 Acne vulgaris: Secondary | ICD-10-CM | POA: Diagnosis not present

## 2020-03-15 DIAGNOSIS — M25571 Pain in right ankle and joints of right foot: Secondary | ICD-10-CM | POA: Diagnosis not present

## 2020-03-15 DIAGNOSIS — Z96611 Presence of right artificial shoulder joint: Secondary | ICD-10-CM | POA: Diagnosis not present

## 2020-03-15 DIAGNOSIS — M797 Fibromyalgia: Secondary | ICD-10-CM | POA: Diagnosis not present

## 2020-03-15 DIAGNOSIS — R4582 Worries: Secondary | ICD-10-CM | POA: Diagnosis not present

## 2020-03-15 DIAGNOSIS — H532 Diplopia: Secondary | ICD-10-CM | POA: Diagnosis not present

## 2020-03-15 DIAGNOSIS — Z952 Presence of prosthetic heart valve: Secondary | ICD-10-CM | POA: Diagnosis not present

## 2020-03-15 DIAGNOSIS — H40053 Ocular hypertension, bilateral: Secondary | ICD-10-CM | POA: Diagnosis not present

## 2020-03-15 DIAGNOSIS — M25461 Effusion, right knee: Secondary | ICD-10-CM | POA: Diagnosis not present

## 2020-03-15 DIAGNOSIS — Z1339 Encounter for screening examination for other mental health and behavioral disorders: Secondary | ICD-10-CM | POA: Diagnosis not present

## 2020-03-15 DIAGNOSIS — Z0189 Encounter for other specified special examinations: Secondary | ICD-10-CM | POA: Diagnosis not present

## 2020-03-15 DIAGNOSIS — E2839 Other primary ovarian failure: Secondary | ICD-10-CM | POA: Diagnosis not present

## 2020-03-15 DIAGNOSIS — I712 Thoracic aortic aneurysm, without rupture: Secondary | ICD-10-CM | POA: Diagnosis not present

## 2020-03-15 DIAGNOSIS — T63001A Toxic effect of unspecified snake venom, accidental (unintentional), initial encounter: Secondary | ICD-10-CM | POA: Diagnosis not present

## 2020-03-15 DIAGNOSIS — Z8679 Personal history of other diseases of the circulatory system: Secondary | ICD-10-CM | POA: Diagnosis not present

## 2020-03-15 DIAGNOSIS — K912 Postsurgical malabsorption, not elsewhere classified: Secondary | ICD-10-CM | POA: Diagnosis not present

## 2020-03-15 DIAGNOSIS — M461 Sacroiliitis, not elsewhere classified: Secondary | ICD-10-CM | POA: Diagnosis not present

## 2020-03-15 DIAGNOSIS — H402233 Chronic angle-closure glaucoma, bilateral, severe stage: Secondary | ICD-10-CM | POA: Diagnosis not present

## 2020-03-15 DIAGNOSIS — L400 Psoriasis vulgaris: Secondary | ICD-10-CM | POA: Diagnosis not present

## 2020-03-15 DIAGNOSIS — R600 Localized edema: Secondary | ICD-10-CM | POA: Diagnosis not present

## 2020-03-15 DIAGNOSIS — I10 Essential (primary) hypertension: Secondary | ICD-10-CM | POA: Diagnosis not present

## 2020-03-15 DIAGNOSIS — R972 Elevated prostate specific antigen [PSA]: Secondary | ICD-10-CM | POA: Diagnosis not present

## 2020-03-15 DIAGNOSIS — M5431 Sciatica, right side: Secondary | ICD-10-CM | POA: Diagnosis not present

## 2020-03-15 DIAGNOSIS — I519 Heart disease, unspecified: Secondary | ICD-10-CM | POA: Diagnosis not present

## 2020-03-15 DIAGNOSIS — Z8739 Personal history of other diseases of the musculoskeletal system and connective tissue: Secondary | ICD-10-CM | POA: Diagnosis not present

## 2020-03-15 DIAGNOSIS — I890 Lymphedema, not elsewhere classified: Secondary | ICD-10-CM | POA: Diagnosis not present

## 2020-03-15 DIAGNOSIS — F3341 Major depressive disorder, recurrent, in partial remission: Secondary | ICD-10-CM | POA: Diagnosis not present

## 2020-03-15 DIAGNOSIS — H401132 Primary open-angle glaucoma, bilateral, moderate stage: Secondary | ICD-10-CM | POA: Diagnosis not present

## 2020-03-15 DIAGNOSIS — K200 Eosinophilic esophagitis: Secondary | ICD-10-CM | POA: Diagnosis not present

## 2020-03-15 DIAGNOSIS — Z9889 Other specified postprocedural states: Secondary | ICD-10-CM | POA: Diagnosis not present

## 2020-03-15 DIAGNOSIS — M25569 Pain in unspecified knee: Secondary | ICD-10-CM | POA: Diagnosis not present

## 2020-03-15 DIAGNOSIS — Z5181 Encounter for therapeutic drug level monitoring: Secondary | ICD-10-CM | POA: Diagnosis not present

## 2020-03-15 DIAGNOSIS — I6523 Occlusion and stenosis of bilateral carotid arteries: Secondary | ICD-10-CM | POA: Diagnosis not present

## 2020-03-15 DIAGNOSIS — R8290 Unspecified abnormal findings in urine: Secondary | ICD-10-CM | POA: Diagnosis not present

## 2020-03-15 DIAGNOSIS — M65871 Other synovitis and tenosynovitis, right ankle and foot: Secondary | ICD-10-CM | POA: Diagnosis not present

## 2020-03-15 DIAGNOSIS — C9210 Chronic myeloid leukemia, BCR/ABL-positive, not having achieved remission: Secondary | ICD-10-CM | POA: Diagnosis not present

## 2020-03-15 DIAGNOSIS — R7303 Prediabetes: Secondary | ICD-10-CM | POA: Diagnosis not present

## 2020-03-15 DIAGNOSIS — I69354 Hemiplegia and hemiparesis following cerebral infarction affecting left non-dominant side: Secondary | ICD-10-CM | POA: Diagnosis not present

## 2020-03-15 DIAGNOSIS — D6862 Lupus anticoagulant syndrome: Secondary | ICD-10-CM | POA: Diagnosis not present

## 2020-03-15 DIAGNOSIS — N3642 Intrinsic sphincter deficiency (ISD): Secondary | ICD-10-CM | POA: Diagnosis not present

## 2020-03-15 DIAGNOSIS — G4709 Other insomnia: Secondary | ICD-10-CM | POA: Diagnosis not present

## 2020-03-15 DIAGNOSIS — S299XXA Unspecified injury of thorax, initial encounter: Secondary | ICD-10-CM | POA: Diagnosis not present

## 2020-03-15 DIAGNOSIS — M4722 Other spondylosis with radiculopathy, cervical region: Secondary | ICD-10-CM | POA: Diagnosis not present

## 2020-03-15 DIAGNOSIS — N281 Cyst of kidney, acquired: Secondary | ICD-10-CM | POA: Diagnosis not present

## 2020-03-15 DIAGNOSIS — K4090 Unilateral inguinal hernia, without obstruction or gangrene, not specified as recurrent: Secondary | ICD-10-CM | POA: Diagnosis not present

## 2020-03-15 DIAGNOSIS — I6521 Occlusion and stenosis of right carotid artery: Secondary | ICD-10-CM | POA: Diagnosis not present

## 2020-03-15 DIAGNOSIS — K2270 Barrett's esophagus without dysplasia: Secondary | ICD-10-CM | POA: Diagnosis not present

## 2020-03-15 DIAGNOSIS — M779 Enthesopathy, unspecified: Secondary | ICD-10-CM | POA: Diagnosis not present

## 2020-03-15 DIAGNOSIS — F331 Major depressive disorder, recurrent, moderate: Secondary | ICD-10-CM | POA: Diagnosis not present

## 2020-03-15 DIAGNOSIS — D2371 Other benign neoplasm of skin of right lower limb, including hip: Secondary | ICD-10-CM | POA: Diagnosis not present

## 2020-03-15 DIAGNOSIS — H401123 Primary open-angle glaucoma, left eye, severe stage: Secondary | ICD-10-CM | POA: Diagnosis not present

## 2020-03-15 DIAGNOSIS — M25561 Pain in right knee: Secondary | ICD-10-CM | POA: Diagnosis not present

## 2020-03-15 DIAGNOSIS — L97309 Non-pressure chronic ulcer of unspecified ankle with unspecified severity: Secondary | ICD-10-CM | POA: Diagnosis not present

## 2020-03-15 DIAGNOSIS — M8580 Other specified disorders of bone density and structure, unspecified site: Secondary | ICD-10-CM | POA: Diagnosis not present

## 2020-03-15 DIAGNOSIS — J9621 Acute and chronic respiratory failure with hypoxia: Secondary | ICD-10-CM | POA: Diagnosis not present

## 2020-03-15 DIAGNOSIS — H40043 Steroid responder, bilateral: Secondary | ICD-10-CM | POA: Diagnosis not present

## 2020-03-15 DIAGNOSIS — M25662 Stiffness of left knee, not elsewhere classified: Secondary | ICD-10-CM | POA: Diagnosis not present

## 2020-03-15 DIAGNOSIS — I8390 Asymptomatic varicose veins of unspecified lower extremity: Secondary | ICD-10-CM | POA: Diagnosis not present

## 2020-03-15 DIAGNOSIS — Z720 Tobacco use: Secondary | ICD-10-CM | POA: Diagnosis not present

## 2020-03-15 DIAGNOSIS — T17308S Unspecified foreign body in larynx causing other injury, sequela: Secondary | ICD-10-CM | POA: Diagnosis not present

## 2020-03-15 DIAGNOSIS — M25612 Stiffness of left shoulder, not elsewhere classified: Secondary | ICD-10-CM | POA: Diagnosis not present

## 2020-03-15 DIAGNOSIS — H43393 Other vitreous opacities, bilateral: Secondary | ICD-10-CM | POA: Diagnosis not present

## 2020-03-15 DIAGNOSIS — L03114 Cellulitis of left upper limb: Secondary | ICD-10-CM | POA: Diagnosis not present

## 2020-03-15 DIAGNOSIS — Z466 Encounter for fitting and adjustment of urinary device: Secondary | ICD-10-CM | POA: Diagnosis not present

## 2020-03-15 DIAGNOSIS — N3000 Acute cystitis without hematuria: Secondary | ICD-10-CM | POA: Diagnosis not present

## 2020-03-15 DIAGNOSIS — K648 Other hemorrhoids: Secondary | ICD-10-CM | POA: Diagnosis not present

## 2020-03-15 DIAGNOSIS — R29898 Other symptoms and signs involving the musculoskeletal system: Secondary | ICD-10-CM | POA: Diagnosis not present

## 2020-03-15 DIAGNOSIS — N189 Chronic kidney disease, unspecified: Secondary | ICD-10-CM | POA: Diagnosis not present

## 2020-03-15 DIAGNOSIS — M25551 Pain in right hip: Secondary | ICD-10-CM | POA: Diagnosis not present

## 2020-03-15 DIAGNOSIS — M79605 Pain in left leg: Secondary | ICD-10-CM | POA: Diagnosis not present

## 2020-03-15 DIAGNOSIS — M25521 Pain in right elbow: Secondary | ICD-10-CM | POA: Diagnosis not present

## 2020-03-15 DIAGNOSIS — G40909 Epilepsy, unspecified, not intractable, without status epilepticus: Secondary | ICD-10-CM | POA: Diagnosis not present

## 2020-03-15 DIAGNOSIS — K117 Disturbances of salivary secretion: Secondary | ICD-10-CM | POA: Diagnosis not present

## 2020-03-15 DIAGNOSIS — E890 Postprocedural hypothyroidism: Secondary | ICD-10-CM | POA: Diagnosis not present

## 2020-03-15 DIAGNOSIS — I639 Cerebral infarction, unspecified: Secondary | ICD-10-CM | POA: Diagnosis not present

## 2020-03-15 DIAGNOSIS — Z87442 Personal history of urinary calculi: Secondary | ICD-10-CM | POA: Diagnosis not present

## 2020-03-15 DIAGNOSIS — M7918 Myalgia, other site: Secondary | ICD-10-CM | POA: Diagnosis not present

## 2020-03-15 DIAGNOSIS — M1811 Unilateral primary osteoarthritis of first carpometacarpal joint, right hand: Secondary | ICD-10-CM | POA: Diagnosis not present

## 2020-03-15 DIAGNOSIS — J439 Emphysema, unspecified: Secondary | ICD-10-CM | POA: Diagnosis not present

## 2020-03-15 DIAGNOSIS — I2583 Coronary atherosclerosis due to lipid rich plaque: Secondary | ICD-10-CM | POA: Diagnosis not present

## 2020-03-15 DIAGNOSIS — Z23 Encounter for immunization: Secondary | ICD-10-CM | POA: Diagnosis not present

## 2020-03-15 DIAGNOSIS — C189 Malignant neoplasm of colon, unspecified: Secondary | ICD-10-CM | POA: Diagnosis not present

## 2020-03-15 DIAGNOSIS — H02883 Meibomian gland dysfunction of right eye, unspecified eyelid: Secondary | ICD-10-CM | POA: Diagnosis not present

## 2020-03-15 DIAGNOSIS — J984 Other disorders of lung: Secondary | ICD-10-CM | POA: Diagnosis not present

## 2020-03-15 DIAGNOSIS — H02205 Unspecified lagophthalmos left lower eyelid: Secondary | ICD-10-CM | POA: Diagnosis not present

## 2020-03-15 DIAGNOSIS — M778 Other enthesopathies, not elsewhere classified: Secondary | ICD-10-CM | POA: Diagnosis not present

## 2020-03-15 DIAGNOSIS — H2513 Age-related nuclear cataract, bilateral: Secondary | ICD-10-CM | POA: Diagnosis not present

## 2020-03-15 DIAGNOSIS — H35373 Puckering of macula, bilateral: Secondary | ICD-10-CM | POA: Diagnosis not present

## 2020-03-15 DIAGNOSIS — Z6841 Body Mass Index (BMI) 40.0 and over, adult: Secondary | ICD-10-CM | POA: Diagnosis not present

## 2020-03-15 DIAGNOSIS — H26493 Other secondary cataract, bilateral: Secondary | ICD-10-CM | POA: Diagnosis not present

## 2020-03-15 DIAGNOSIS — D225 Melanocytic nevi of trunk: Secondary | ICD-10-CM | POA: Diagnosis not present

## 2020-03-15 DIAGNOSIS — I83811 Varicose veins of right lower extremities with pain: Secondary | ICD-10-CM | POA: Diagnosis not present

## 2020-03-15 DIAGNOSIS — J3089 Other allergic rhinitis: Secondary | ICD-10-CM | POA: Diagnosis not present

## 2020-03-15 DIAGNOSIS — I714 Abdominal aortic aneurysm, without rupture: Secondary | ICD-10-CM | POA: Diagnosis not present

## 2020-03-15 DIAGNOSIS — R262 Difficulty in walking, not elsewhere classified: Secondary | ICD-10-CM | POA: Diagnosis not present

## 2020-03-15 DIAGNOSIS — I255 Ischemic cardiomyopathy: Secondary | ICD-10-CM | POA: Diagnosis not present

## 2020-03-15 DIAGNOSIS — Z6839 Body mass index (BMI) 39.0-39.9, adult: Secondary | ICD-10-CM | POA: Diagnosis not present

## 2020-03-15 DIAGNOSIS — N132 Hydronephrosis with renal and ureteral calculous obstruction: Secondary | ICD-10-CM | POA: Diagnosis not present

## 2020-03-15 DIAGNOSIS — R319 Hematuria, unspecified: Secondary | ICD-10-CM | POA: Diagnosis not present

## 2020-03-15 DIAGNOSIS — R634 Abnormal weight loss: Secondary | ICD-10-CM | POA: Diagnosis not present

## 2020-03-15 DIAGNOSIS — R7989 Other specified abnormal findings of blood chemistry: Secondary | ICD-10-CM | POA: Diagnosis not present

## 2020-03-15 DIAGNOSIS — H353132 Nonexudative age-related macular degeneration, bilateral, intermediate dry stage: Secondary | ICD-10-CM | POA: Diagnosis not present

## 2020-03-15 DIAGNOSIS — E875 Hyperkalemia: Secondary | ICD-10-CM | POA: Diagnosis not present

## 2020-03-15 DIAGNOSIS — D2261 Melanocytic nevi of right upper limb, including shoulder: Secondary | ICD-10-CM | POA: Diagnosis not present

## 2020-03-15 DIAGNOSIS — I70209 Unspecified atherosclerosis of native arteries of extremities, unspecified extremity: Secondary | ICD-10-CM | POA: Diagnosis not present

## 2020-03-15 DIAGNOSIS — Z8551 Personal history of malignant neoplasm of bladder: Secondary | ICD-10-CM | POA: Diagnosis not present

## 2020-03-15 DIAGNOSIS — M19011 Primary osteoarthritis, right shoulder: Secondary | ICD-10-CM | POA: Diagnosis not present

## 2020-03-15 DIAGNOSIS — R410 Disorientation, unspecified: Secondary | ICD-10-CM | POA: Diagnosis not present

## 2020-03-15 DIAGNOSIS — Z01818 Encounter for other preprocedural examination: Secondary | ICD-10-CM | POA: Diagnosis not present

## 2020-03-15 DIAGNOSIS — M810 Age-related osteoporosis without current pathological fracture: Secondary | ICD-10-CM | POA: Diagnosis not present

## 2020-03-15 DIAGNOSIS — I739 Peripheral vascular disease, unspecified: Secondary | ICD-10-CM | POA: Diagnosis not present

## 2020-03-15 DIAGNOSIS — H6523 Chronic serous otitis media, bilateral: Secondary | ICD-10-CM | POA: Diagnosis not present

## 2020-03-15 DIAGNOSIS — Z6827 Body mass index (BMI) 27.0-27.9, adult: Secondary | ICD-10-CM | POA: Diagnosis not present

## 2020-03-15 DIAGNOSIS — J441 Chronic obstructive pulmonary disease with (acute) exacerbation: Secondary | ICD-10-CM | POA: Diagnosis not present

## 2020-03-15 DIAGNOSIS — I25111 Atherosclerotic heart disease of native coronary artery with angina pectoris with documented spasm: Secondary | ICD-10-CM | POA: Diagnosis not present

## 2020-03-15 DIAGNOSIS — R6889 Other general symptoms and signs: Secondary | ICD-10-CM | POA: Diagnosis not present

## 2020-03-15 DIAGNOSIS — R519 Headache, unspecified: Secondary | ICD-10-CM | POA: Diagnosis not present

## 2020-03-15 DIAGNOSIS — R350 Frequency of micturition: Secondary | ICD-10-CM | POA: Diagnosis not present

## 2020-03-15 DIAGNOSIS — F3131 Bipolar disorder, current episode depressed, mild: Secondary | ICD-10-CM | POA: Diagnosis not present

## 2020-03-15 DIAGNOSIS — K635 Polyp of colon: Secondary | ICD-10-CM | POA: Diagnosis not present

## 2020-03-15 DIAGNOSIS — G5600 Carpal tunnel syndrome, unspecified upper limb: Secondary | ICD-10-CM | POA: Diagnosis not present

## 2020-03-15 DIAGNOSIS — G43719 Chronic migraine without aura, intractable, without status migrainosus: Secondary | ICD-10-CM | POA: Diagnosis not present

## 2020-03-15 DIAGNOSIS — I350 Nonrheumatic aortic (valve) stenosis: Secondary | ICD-10-CM | POA: Diagnosis not present

## 2020-03-15 DIAGNOSIS — H30031 Focal chorioretinal inflammation, peripheral, right eye: Secondary | ICD-10-CM | POA: Diagnosis not present

## 2020-03-15 DIAGNOSIS — N329 Bladder disorder, unspecified: Secondary | ICD-10-CM | POA: Diagnosis not present

## 2020-03-15 DIAGNOSIS — D23121 Other benign neoplasm of skin of left upper eyelid, including canthus: Secondary | ICD-10-CM | POA: Diagnosis not present

## 2020-03-15 DIAGNOSIS — M2170 Unequal limb length (acquired), unspecified site: Secondary | ICD-10-CM | POA: Diagnosis not present

## 2020-03-15 DIAGNOSIS — H90A12 Conductive hearing loss, unilateral, left ear with restricted hearing on the contralateral side: Secondary | ICD-10-CM | POA: Diagnosis not present

## 2020-03-15 DIAGNOSIS — E663 Overweight: Secondary | ICD-10-CM | POA: Diagnosis not present

## 2020-03-15 DIAGNOSIS — H401112 Primary open-angle glaucoma, right eye, moderate stage: Secondary | ICD-10-CM | POA: Diagnosis not present

## 2020-03-15 DIAGNOSIS — G540 Brachial plexus disorders: Secondary | ICD-10-CM | POA: Diagnosis not present

## 2020-03-15 DIAGNOSIS — F5101 Primary insomnia: Secondary | ICD-10-CM | POA: Diagnosis not present

## 2020-03-15 DIAGNOSIS — H35013 Changes in retinal vascular appearance, bilateral: Secondary | ICD-10-CM | POA: Diagnosis not present

## 2020-03-15 DIAGNOSIS — M25532 Pain in left wrist: Secondary | ICD-10-CM | POA: Diagnosis not present

## 2020-03-15 DIAGNOSIS — I447 Left bundle-branch block, unspecified: Secondary | ICD-10-CM | POA: Diagnosis not present

## 2020-03-15 DIAGNOSIS — Z95820 Peripheral vascular angioplasty status with implants and grafts: Secondary | ICD-10-CM | POA: Diagnosis not present

## 2020-03-15 DIAGNOSIS — D1800 Hemangioma unspecified site: Secondary | ICD-10-CM | POA: Diagnosis not present

## 2020-03-15 DIAGNOSIS — I495 Sick sinus syndrome: Secondary | ICD-10-CM | POA: Diagnosis not present

## 2020-03-15 DIAGNOSIS — I351 Nonrheumatic aortic (valve) insufficiency: Secondary | ICD-10-CM | POA: Diagnosis not present

## 2020-03-15 DIAGNOSIS — M79641 Pain in right hand: Secondary | ICD-10-CM | POA: Diagnosis not present

## 2020-03-15 DIAGNOSIS — Z951 Presence of aortocoronary bypass graft: Secondary | ICD-10-CM | POA: Diagnosis not present

## 2020-03-15 DIAGNOSIS — E11649 Type 2 diabetes mellitus with hypoglycemia without coma: Secondary | ICD-10-CM | POA: Diagnosis not present

## 2020-03-15 DIAGNOSIS — S42252A Displaced fracture of greater tuberosity of left humerus, initial encounter for closed fracture: Secondary | ICD-10-CM | POA: Diagnosis not present

## 2020-03-15 DIAGNOSIS — D229 Melanocytic nevi, unspecified: Secondary | ICD-10-CM | POA: Diagnosis not present

## 2020-03-15 DIAGNOSIS — E785 Hyperlipidemia, unspecified: Secondary | ICD-10-CM | POA: Diagnosis not present

## 2020-03-15 DIAGNOSIS — M791 Myalgia, unspecified site: Secondary | ICD-10-CM | POA: Diagnosis not present

## 2020-03-15 DIAGNOSIS — M722 Plantar fascial fibromatosis: Secondary | ICD-10-CM | POA: Diagnosis not present

## 2020-03-15 DIAGNOSIS — G893 Neoplasm related pain (acute) (chronic): Secondary | ICD-10-CM | POA: Diagnosis not present

## 2020-03-15 DIAGNOSIS — M6289 Other specified disorders of muscle: Secondary | ICD-10-CM | POA: Diagnosis not present

## 2020-03-15 DIAGNOSIS — I110 Hypertensive heart disease with heart failure: Secondary | ICD-10-CM | POA: Diagnosis not present

## 2020-03-15 DIAGNOSIS — M25611 Stiffness of right shoulder, not elsewhere classified: Secondary | ICD-10-CM | POA: Diagnosis not present

## 2020-03-15 DIAGNOSIS — J189 Pneumonia, unspecified organism: Secondary | ICD-10-CM | POA: Diagnosis not present

## 2020-03-15 DIAGNOSIS — D508 Other iron deficiency anemias: Secondary | ICD-10-CM | POA: Diagnosis not present

## 2020-03-15 DIAGNOSIS — N819 Female genital prolapse, unspecified: Secondary | ICD-10-CM | POA: Diagnosis not present

## 2020-03-15 DIAGNOSIS — R569 Unspecified convulsions: Secondary | ICD-10-CM | POA: Diagnosis not present

## 2020-03-15 DIAGNOSIS — D2271 Melanocytic nevi of right lower limb, including hip: Secondary | ICD-10-CM | POA: Diagnosis not present

## 2020-03-15 DIAGNOSIS — L918 Other hypertrophic disorders of the skin: Secondary | ICD-10-CM | POA: Diagnosis not present

## 2020-03-15 DIAGNOSIS — E11311 Type 2 diabetes mellitus with unspecified diabetic retinopathy with macular edema: Secondary | ICD-10-CM | POA: Diagnosis not present

## 2020-03-15 DIAGNOSIS — Z888 Allergy status to other drugs, medicaments and biological substances status: Secondary | ICD-10-CM | POA: Diagnosis not present

## 2020-03-15 DIAGNOSIS — I455 Other specified heart block: Secondary | ICD-10-CM | POA: Diagnosis not present

## 2020-03-15 DIAGNOSIS — I5189 Other ill-defined heart diseases: Secondary | ICD-10-CM | POA: Diagnosis not present

## 2020-03-15 DIAGNOSIS — L409 Psoriasis, unspecified: Secondary | ICD-10-CM | POA: Diagnosis not present

## 2020-03-15 DIAGNOSIS — L728 Other follicular cysts of the skin and subcutaneous tissue: Secondary | ICD-10-CM | POA: Diagnosis not present

## 2020-03-15 DIAGNOSIS — S14125S Central cord syndrome at C5 level of cervical spinal cord, sequela: Secondary | ICD-10-CM | POA: Diagnosis not present

## 2020-03-15 DIAGNOSIS — C61 Malignant neoplasm of prostate: Secondary | ICD-10-CM | POA: Diagnosis not present

## 2020-03-15 DIAGNOSIS — E2740 Unspecified adrenocortical insufficiency: Secondary | ICD-10-CM | POA: Diagnosis not present

## 2020-03-15 DIAGNOSIS — F1120 Opioid dependence, uncomplicated: Secondary | ICD-10-CM | POA: Diagnosis not present

## 2020-03-15 DIAGNOSIS — Z9621 Cochlear implant status: Secondary | ICD-10-CM | POA: Diagnosis not present

## 2020-03-15 DIAGNOSIS — R7309 Other abnormal glucose: Secondary | ICD-10-CM | POA: Diagnosis not present

## 2020-03-15 DIAGNOSIS — S1096XA Insect bite of unspecified part of neck, initial encounter: Secondary | ICD-10-CM | POA: Diagnosis not present

## 2020-03-15 DIAGNOSIS — M5442 Lumbago with sciatica, left side: Secondary | ICD-10-CM | POA: Diagnosis not present

## 2020-03-15 DIAGNOSIS — M816 Localized osteoporosis [Lequesne]: Secondary | ICD-10-CM | POA: Diagnosis not present

## 2020-03-15 DIAGNOSIS — H00011 Hordeolum externum right upper eyelid: Secondary | ICD-10-CM | POA: Diagnosis not present

## 2020-03-15 DIAGNOSIS — N958 Other specified menopausal and perimenopausal disorders: Secondary | ICD-10-CM | POA: Diagnosis not present

## 2020-03-15 DIAGNOSIS — H353112 Nonexudative age-related macular degeneration, right eye, intermediate dry stage: Secondary | ICD-10-CM | POA: Diagnosis not present

## 2020-03-15 DIAGNOSIS — J9601 Acute respiratory failure with hypoxia: Secondary | ICD-10-CM | POA: Diagnosis not present

## 2020-03-15 DIAGNOSIS — M79652 Pain in left thigh: Secondary | ICD-10-CM | POA: Diagnosis not present

## 2020-03-15 DIAGNOSIS — Z8639 Personal history of other endocrine, nutritional and metabolic disease: Secondary | ICD-10-CM | POA: Diagnosis not present

## 2020-03-15 DIAGNOSIS — D492 Neoplasm of unspecified behavior of bone, soft tissue, and skin: Secondary | ICD-10-CM | POA: Diagnosis not present

## 2020-03-15 DIAGNOSIS — M4856XA Collapsed vertebra, not elsewhere classified, lumbar region, initial encounter for fracture: Secondary | ICD-10-CM | POA: Diagnosis not present

## 2020-03-15 DIAGNOSIS — R7301 Impaired fasting glucose: Secondary | ICD-10-CM | POA: Diagnosis not present

## 2020-03-15 DIAGNOSIS — I70201 Unspecified atherosclerosis of native arteries of extremities, right leg: Secondary | ICD-10-CM | POA: Diagnosis not present

## 2020-03-15 DIAGNOSIS — R1030 Lower abdominal pain, unspecified: Secondary | ICD-10-CM | POA: Diagnosis not present

## 2020-03-15 DIAGNOSIS — C7989 Secondary malignant neoplasm of other specified sites: Secondary | ICD-10-CM | POA: Diagnosis not present

## 2020-03-15 DIAGNOSIS — G4733 Obstructive sleep apnea (adult) (pediatric): Secondary | ICD-10-CM | POA: Diagnosis not present

## 2020-03-15 DIAGNOSIS — B070 Plantar wart: Secondary | ICD-10-CM | POA: Diagnosis not present

## 2020-03-15 DIAGNOSIS — M75112 Incomplete rotator cuff tear or rupture of left shoulder, not specified as traumatic: Secondary | ICD-10-CM | POA: Diagnosis not present

## 2020-03-15 DIAGNOSIS — H353124 Nonexudative age-related macular degeneration, left eye, advanced atrophic with subfoveal involvement: Secondary | ICD-10-CM | POA: Diagnosis not present

## 2020-03-15 DIAGNOSIS — G479 Sleep disorder, unspecified: Secondary | ICD-10-CM | POA: Diagnosis not present

## 2020-03-15 DIAGNOSIS — Z125 Encounter for screening for malignant neoplasm of prostate: Secondary | ICD-10-CM | POA: Diagnosis not present

## 2020-03-15 DIAGNOSIS — E1151 Type 2 diabetes mellitus with diabetic peripheral angiopathy without gangrene: Secondary | ICD-10-CM | POA: Diagnosis not present

## 2020-03-15 DIAGNOSIS — M87059 Idiopathic aseptic necrosis of unspecified femur: Secondary | ICD-10-CM | POA: Diagnosis not present

## 2020-03-15 DIAGNOSIS — N393 Stress incontinence (female) (male): Secondary | ICD-10-CM | POA: Diagnosis not present

## 2020-03-15 DIAGNOSIS — H2512 Age-related nuclear cataract, left eye: Secondary | ICD-10-CM | POA: Diagnosis not present

## 2020-03-15 DIAGNOSIS — Z452 Encounter for adjustment and management of vascular access device: Secondary | ICD-10-CM | POA: Diagnosis not present

## 2020-03-15 DIAGNOSIS — C678 Malignant neoplasm of overlapping sites of bladder: Secondary | ICD-10-CM | POA: Diagnosis not present

## 2020-03-15 DIAGNOSIS — Z9109 Other allergy status, other than to drugs and biological substances: Secondary | ICD-10-CM | POA: Diagnosis not present

## 2020-03-15 DIAGNOSIS — J019 Acute sinusitis, unspecified: Secondary | ICD-10-CM | POA: Diagnosis not present

## 2020-03-15 DIAGNOSIS — R197 Diarrhea, unspecified: Secondary | ICD-10-CM | POA: Diagnosis not present

## 2020-03-15 DIAGNOSIS — H53453 Other localized visual field defect, bilateral: Secondary | ICD-10-CM | POA: Diagnosis not present

## 2020-03-15 DIAGNOSIS — B0229 Other postherpetic nervous system involvement: Secondary | ICD-10-CM | POA: Diagnosis not present

## 2020-03-15 DIAGNOSIS — M5440 Lumbago with sciatica, unspecified side: Secondary | ICD-10-CM | POA: Diagnosis not present

## 2020-03-15 DIAGNOSIS — Z139 Encounter for screening, unspecified: Secondary | ICD-10-CM | POA: Diagnosis not present

## 2020-03-15 DIAGNOSIS — Z853 Personal history of malignant neoplasm of breast: Secondary | ICD-10-CM | POA: Diagnosis not present

## 2020-03-15 DIAGNOSIS — H9313 Tinnitus, bilateral: Secondary | ICD-10-CM | POA: Diagnosis not present

## 2020-03-15 DIAGNOSIS — H25813 Combined forms of age-related cataract, bilateral: Secondary | ICD-10-CM | POA: Diagnosis not present

## 2020-03-15 DIAGNOSIS — Z6825 Body mass index (BMI) 25.0-25.9, adult: Secondary | ICD-10-CM | POA: Diagnosis not present

## 2020-03-15 DIAGNOSIS — E6609 Other obesity due to excess calories: Secondary | ICD-10-CM | POA: Diagnosis not present

## 2020-03-15 DIAGNOSIS — E669 Obesity, unspecified: Secondary | ICD-10-CM | POA: Diagnosis not present

## 2020-03-15 DIAGNOSIS — G5603 Carpal tunnel syndrome, bilateral upper limbs: Secondary | ICD-10-CM | POA: Diagnosis not present

## 2020-03-15 DIAGNOSIS — Z471 Aftercare following joint replacement surgery: Secondary | ICD-10-CM | POA: Diagnosis not present

## 2020-03-15 DIAGNOSIS — R0602 Shortness of breath: Secondary | ICD-10-CM | POA: Diagnosis not present

## 2020-03-15 DIAGNOSIS — Z944 Liver transplant status: Secondary | ICD-10-CM | POA: Diagnosis not present

## 2020-03-15 DIAGNOSIS — L820 Inflamed seborrheic keratosis: Secondary | ICD-10-CM | POA: Diagnosis not present

## 2020-03-15 DIAGNOSIS — R490 Dysphonia: Secondary | ICD-10-CM | POA: Diagnosis not present

## 2020-03-15 DIAGNOSIS — N178 Other acute kidney failure: Secondary | ICD-10-CM | POA: Diagnosis not present

## 2020-03-15 DIAGNOSIS — H53462 Homonymous bilateral field defects, left side: Secondary | ICD-10-CM | POA: Diagnosis not present

## 2020-03-15 DIAGNOSIS — H34831 Tributary (branch) retinal vein occlusion, right eye, with macular edema: Secondary | ICD-10-CM | POA: Diagnosis not present

## 2020-03-15 DIAGNOSIS — F1110 Opioid abuse, uncomplicated: Secondary | ICD-10-CM | POA: Diagnosis not present

## 2020-03-15 DIAGNOSIS — N939 Abnormal uterine and vaginal bleeding, unspecified: Secondary | ICD-10-CM | POA: Diagnosis not present

## 2020-03-15 DIAGNOSIS — Z7901 Long term (current) use of anticoagulants: Secondary | ICD-10-CM | POA: Diagnosis not present

## 2020-03-15 DIAGNOSIS — N888 Other specified noninflammatory disorders of cervix uteri: Secondary | ICD-10-CM | POA: Diagnosis not present

## 2020-03-15 DIAGNOSIS — H02105 Unspecified ectropion of left lower eyelid: Secondary | ICD-10-CM | POA: Diagnosis not present

## 2020-03-15 DIAGNOSIS — R109 Unspecified abdominal pain: Secondary | ICD-10-CM | POA: Diagnosis not present

## 2020-03-15 DIAGNOSIS — N3090 Cystitis, unspecified without hematuria: Secondary | ICD-10-CM | POA: Diagnosis not present

## 2020-03-15 DIAGNOSIS — L02612 Cutaneous abscess of left foot: Secondary | ICD-10-CM | POA: Diagnosis not present

## 2020-03-15 DIAGNOSIS — E0800 Diabetes mellitus due to underlying condition with hyperosmolarity without nonketotic hyperglycemic-hyperosmolar coma (NKHHC): Secondary | ICD-10-CM | POA: Diagnosis not present

## 2020-03-15 DIAGNOSIS — M48061 Spinal stenosis, lumbar region without neurogenic claudication: Secondary | ICD-10-CM | POA: Diagnosis not present

## 2020-03-15 DIAGNOSIS — Z6828 Body mass index (BMI) 28.0-28.9, adult: Secondary | ICD-10-CM | POA: Diagnosis not present

## 2020-03-15 DIAGNOSIS — E278 Other specified disorders of adrenal gland: Secondary | ICD-10-CM | POA: Diagnosis not present

## 2020-03-15 DIAGNOSIS — K089 Disorder of teeth and supporting structures, unspecified: Secondary | ICD-10-CM | POA: Diagnosis not present

## 2020-03-15 DIAGNOSIS — M109 Gout, unspecified: Secondary | ICD-10-CM | POA: Diagnosis not present

## 2020-03-15 DIAGNOSIS — M2550 Pain in unspecified joint: Secondary | ICD-10-CM | POA: Diagnosis not present

## 2020-03-15 DIAGNOSIS — E11622 Type 2 diabetes mellitus with other skin ulcer: Secondary | ICD-10-CM | POA: Diagnosis not present

## 2020-03-15 DIAGNOSIS — L723 Sebaceous cyst: Secondary | ICD-10-CM | POA: Diagnosis not present

## 2020-03-15 DIAGNOSIS — S9000XA Contusion of unspecified ankle, initial encounter: Secondary | ICD-10-CM | POA: Diagnosis not present

## 2020-03-15 DIAGNOSIS — M5116 Intervertebral disc disorders with radiculopathy, lumbar region: Secondary | ICD-10-CM | POA: Diagnosis not present

## 2020-03-15 DIAGNOSIS — K700 Alcoholic fatty liver: Secondary | ICD-10-CM | POA: Diagnosis not present

## 2020-03-15 DIAGNOSIS — W57XXXA Bitten or stung by nonvenomous insect and other nonvenomous arthropods, initial encounter: Secondary | ICD-10-CM | POA: Diagnosis not present

## 2020-03-15 DIAGNOSIS — M8588 Other specified disorders of bone density and structure, other site: Secondary | ICD-10-CM | POA: Diagnosis not present

## 2020-03-15 DIAGNOSIS — J470 Bronchiectasis with acute lower respiratory infection: Secondary | ICD-10-CM | POA: Diagnosis not present

## 2020-03-15 DIAGNOSIS — C7951 Secondary malignant neoplasm of bone: Secondary | ICD-10-CM | POA: Diagnosis not present

## 2020-03-15 DIAGNOSIS — E782 Mixed hyperlipidemia: Secondary | ICD-10-CM | POA: Diagnosis not present

## 2020-03-15 DIAGNOSIS — S161XXA Strain of muscle, fascia and tendon at neck level, initial encounter: Secondary | ICD-10-CM | POA: Diagnosis not present

## 2020-03-15 DIAGNOSIS — H35711 Central serous chorioretinopathy, right eye: Secondary | ICD-10-CM | POA: Diagnosis not present

## 2020-03-15 DIAGNOSIS — Z77090 Contact with and (suspected) exposure to asbestos: Secondary | ICD-10-CM | POA: Diagnosis not present

## 2020-03-15 DIAGNOSIS — K644 Residual hemorrhoidal skin tags: Secondary | ICD-10-CM | POA: Diagnosis not present

## 2020-03-15 DIAGNOSIS — L98499 Non-pressure chronic ulcer of skin of other sites with unspecified severity: Secondary | ICD-10-CM | POA: Diagnosis not present

## 2020-03-15 DIAGNOSIS — R351 Nocturia: Secondary | ICD-10-CM | POA: Diagnosis not present

## 2020-03-15 DIAGNOSIS — C7801 Secondary malignant neoplasm of right lung: Secondary | ICD-10-CM | POA: Diagnosis not present

## 2020-03-15 DIAGNOSIS — S32501S Unspecified fracture of right pubis, sequela: Secondary | ICD-10-CM | POA: Diagnosis not present

## 2020-03-15 DIAGNOSIS — D0461 Carcinoma in situ of skin of right upper limb, including shoulder: Secondary | ICD-10-CM | POA: Diagnosis not present

## 2020-03-15 DIAGNOSIS — E559 Vitamin D deficiency, unspecified: Secondary | ICD-10-CM | POA: Diagnosis not present

## 2020-03-15 DIAGNOSIS — H43811 Vitreous degeneration, right eye: Secondary | ICD-10-CM | POA: Diagnosis not present

## 2020-03-15 DIAGNOSIS — Z85820 Personal history of malignant melanoma of skin: Secondary | ICD-10-CM | POA: Diagnosis not present

## 2020-03-15 DIAGNOSIS — S0990XA Unspecified injury of head, initial encounter: Secondary | ICD-10-CM | POA: Diagnosis not present

## 2020-03-15 DIAGNOSIS — J180 Bronchopneumonia, unspecified organism: Secondary | ICD-10-CM | POA: Diagnosis not present

## 2020-03-15 DIAGNOSIS — E213 Hyperparathyroidism, unspecified: Secondary | ICD-10-CM | POA: Diagnosis not present

## 2020-03-15 DIAGNOSIS — B351 Tinea unguium: Secondary | ICD-10-CM | POA: Diagnosis not present

## 2020-03-15 DIAGNOSIS — Z87440 Personal history of urinary (tract) infections: Secondary | ICD-10-CM | POA: Diagnosis not present

## 2020-03-15 DIAGNOSIS — S22070D Wedge compression fracture of T9-T10 vertebra, subsequent encounter for fracture with routine healing: Secondary | ICD-10-CM | POA: Diagnosis not present

## 2020-03-15 DIAGNOSIS — I509 Heart failure, unspecified: Secondary | ICD-10-CM | POA: Diagnosis not present

## 2020-03-15 DIAGNOSIS — N179 Acute kidney failure, unspecified: Secondary | ICD-10-CM | POA: Diagnosis not present

## 2020-03-15 DIAGNOSIS — Z9989 Dependence on other enabling machines and devices: Secondary | ICD-10-CM | POA: Diagnosis not present

## 2020-03-15 DIAGNOSIS — E872 Acidosis: Secondary | ICD-10-CM | POA: Diagnosis not present

## 2020-03-15 DIAGNOSIS — M7551 Bursitis of right shoulder: Secondary | ICD-10-CM | POA: Diagnosis not present

## 2020-03-15 DIAGNOSIS — C155 Malignant neoplasm of lower third of esophagus: Secondary | ICD-10-CM | POA: Diagnosis not present

## 2020-03-15 DIAGNOSIS — J4520 Mild intermittent asthma, uncomplicated: Secondary | ICD-10-CM | POA: Diagnosis not present

## 2020-03-15 DIAGNOSIS — K228 Other specified diseases of esophagus: Secondary | ICD-10-CM | POA: Diagnosis not present

## 2020-03-15 DIAGNOSIS — Z96641 Presence of right artificial hip joint: Secondary | ICD-10-CM | POA: Diagnosis not present

## 2020-03-15 DIAGNOSIS — N1832 Chronic kidney disease, stage 3b: Secondary | ICD-10-CM | POA: Diagnosis not present

## 2020-03-15 DIAGNOSIS — M2042 Other hammer toe(s) (acquired), left foot: Secondary | ICD-10-CM | POA: Diagnosis not present

## 2020-03-15 DIAGNOSIS — Z7184 Encounter for health counseling related to travel: Secondary | ICD-10-CM | POA: Diagnosis not present

## 2020-03-15 DIAGNOSIS — C3491 Malignant neoplasm of unspecified part of right bronchus or lung: Secondary | ICD-10-CM | POA: Diagnosis not present

## 2020-03-15 DIAGNOSIS — I83003 Varicose veins of unspecified lower extremity with ulcer of ankle: Secondary | ICD-10-CM | POA: Diagnosis not present

## 2020-03-15 DIAGNOSIS — Z01810 Encounter for preprocedural cardiovascular examination: Secondary | ICD-10-CM | POA: Diagnosis not present

## 2020-03-15 DIAGNOSIS — R718 Other abnormality of red blood cells: Secondary | ICD-10-CM | POA: Diagnosis not present

## 2020-03-15 DIAGNOSIS — D469 Myelodysplastic syndrome, unspecified: Secondary | ICD-10-CM | POA: Diagnosis not present

## 2020-03-15 DIAGNOSIS — Z13820 Encounter for screening for osteoporosis: Secondary | ICD-10-CM | POA: Diagnosis not present

## 2020-03-15 DIAGNOSIS — E1122 Type 2 diabetes mellitus with diabetic chronic kidney disease: Secondary | ICD-10-CM | POA: Diagnosis not present

## 2020-03-15 DIAGNOSIS — Z85828 Personal history of other malignant neoplasm of skin: Secondary | ICD-10-CM | POA: Diagnosis not present

## 2020-03-15 DIAGNOSIS — H401133 Primary open-angle glaucoma, bilateral, severe stage: Secondary | ICD-10-CM | POA: Diagnosis not present

## 2020-03-15 DIAGNOSIS — Z9181 History of falling: Secondary | ICD-10-CM | POA: Diagnosis not present

## 2020-03-15 DIAGNOSIS — M7702 Medial epicondylitis, left elbow: Secondary | ICD-10-CM | POA: Diagnosis not present

## 2020-03-15 DIAGNOSIS — Z01812 Encounter for preprocedural laboratory examination: Secondary | ICD-10-CM | POA: Diagnosis not present

## 2020-03-15 DIAGNOSIS — M0600 Rheumatoid arthritis without rheumatoid factor, unspecified site: Secondary | ICD-10-CM | POA: Diagnosis not present

## 2020-03-15 DIAGNOSIS — F324 Major depressive disorder, single episode, in partial remission: Secondary | ICD-10-CM | POA: Diagnosis not present

## 2020-03-15 DIAGNOSIS — B379 Candidiasis, unspecified: Secondary | ICD-10-CM | POA: Diagnosis not present

## 2020-03-15 DIAGNOSIS — Z872 Personal history of diseases of the skin and subcutaneous tissue: Secondary | ICD-10-CM | POA: Diagnosis not present

## 2020-03-15 DIAGNOSIS — E7841 Elevated Lipoprotein(a): Secondary | ICD-10-CM | POA: Diagnosis not present

## 2020-03-15 DIAGNOSIS — Z780 Asymptomatic menopausal state: Secondary | ICD-10-CM | POA: Diagnosis not present

## 2020-03-15 DIAGNOSIS — C9000 Multiple myeloma not having achieved remission: Secondary | ICD-10-CM | POA: Diagnosis not present

## 2020-03-15 DIAGNOSIS — E1129 Type 2 diabetes mellitus with other diabetic kidney complication: Secondary | ICD-10-CM | POA: Diagnosis not present

## 2020-03-15 DIAGNOSIS — H04203 Unspecified epiphora, bilateral lacrimal glands: Secondary | ICD-10-CM | POA: Diagnosis not present

## 2020-03-15 DIAGNOSIS — C8590 Non-Hodgkin lymphoma, unspecified, unspecified site: Secondary | ICD-10-CM | POA: Diagnosis not present

## 2020-03-15 DIAGNOSIS — S93401A Sprain of unspecified ligament of right ankle, initial encounter: Secondary | ICD-10-CM | POA: Diagnosis not present

## 2020-03-15 DIAGNOSIS — I872 Venous insufficiency (chronic) (peripheral): Secondary | ICD-10-CM | POA: Diagnosis not present

## 2020-03-15 DIAGNOSIS — H34811 Central retinal vein occlusion, right eye, with macular edema: Secondary | ICD-10-CM | POA: Diagnosis not present

## 2020-03-15 DIAGNOSIS — C787 Secondary malignant neoplasm of liver and intrahepatic bile duct: Secondary | ICD-10-CM | POA: Diagnosis not present

## 2020-03-15 DIAGNOSIS — K5730 Diverticulosis of large intestine without perforation or abscess without bleeding: Secondary | ICD-10-CM | POA: Diagnosis not present

## 2020-03-15 DIAGNOSIS — C20 Malignant neoplasm of rectum: Secondary | ICD-10-CM | POA: Diagnosis not present

## 2020-03-15 DIAGNOSIS — M79642 Pain in left hand: Secondary | ICD-10-CM | POA: Diagnosis not present

## 2020-03-15 DIAGNOSIS — E7849 Other hyperlipidemia: Secondary | ICD-10-CM | POA: Diagnosis not present

## 2020-03-15 DIAGNOSIS — Z7189 Other specified counseling: Secondary | ICD-10-CM | POA: Diagnosis not present

## 2020-03-15 DIAGNOSIS — Z0001 Encounter for general adult medical examination with abnormal findings: Secondary | ICD-10-CM | POA: Diagnosis not present

## 2020-03-15 DIAGNOSIS — E1121 Type 2 diabetes mellitus with diabetic nephropathy: Secondary | ICD-10-CM | POA: Diagnosis not present

## 2020-03-15 DIAGNOSIS — J329 Chronic sinusitis, unspecified: Secondary | ICD-10-CM | POA: Diagnosis not present

## 2020-03-15 DIAGNOSIS — D1801 Hemangioma of skin and subcutaneous tissue: Secondary | ICD-10-CM | POA: Diagnosis not present

## 2020-03-15 DIAGNOSIS — R252 Cramp and spasm: Secondary | ICD-10-CM | POA: Diagnosis not present

## 2020-03-15 DIAGNOSIS — J432 Centrilobular emphysema: Secondary | ICD-10-CM | POA: Diagnosis not present

## 2020-03-15 DIAGNOSIS — N8111 Cystocele, midline: Secondary | ICD-10-CM | POA: Diagnosis not present

## 2020-03-15 DIAGNOSIS — J4530 Mild persistent asthma, uncomplicated: Secondary | ICD-10-CM | POA: Diagnosis not present

## 2020-03-15 DIAGNOSIS — M4326 Fusion of spine, lumbar region: Secondary | ICD-10-CM | POA: Diagnosis not present

## 2020-03-15 DIAGNOSIS — Q2730 Arteriovenous malformation, site unspecified: Secondary | ICD-10-CM | POA: Diagnosis not present

## 2020-03-15 DIAGNOSIS — N200 Calculus of kidney: Secondary | ICD-10-CM | POA: Diagnosis not present

## 2020-03-15 DIAGNOSIS — M1612 Unilateral primary osteoarthritis, left hip: Secondary | ICD-10-CM | POA: Diagnosis not present

## 2020-03-15 DIAGNOSIS — Z4801 Encounter for change or removal of surgical wound dressing: Secondary | ICD-10-CM | POA: Diagnosis not present

## 2020-03-15 DIAGNOSIS — D2262 Melanocytic nevi of left upper limb, including shoulder: Secondary | ICD-10-CM | POA: Diagnosis not present

## 2020-03-15 DIAGNOSIS — R3915 Urgency of urination: Secondary | ICD-10-CM | POA: Diagnosis not present

## 2020-03-15 DIAGNOSIS — D224 Melanocytic nevi of scalp and neck: Secondary | ICD-10-CM | POA: Diagnosis not present

## 2020-03-15 DIAGNOSIS — M5135 Other intervertebral disc degeneration, thoracolumbar region: Secondary | ICD-10-CM | POA: Diagnosis not present

## 2020-03-15 DIAGNOSIS — Z1231 Encounter for screening mammogram for malignant neoplasm of breast: Secondary | ICD-10-CM | POA: Diagnosis not present

## 2020-03-15 DIAGNOSIS — Z1389 Encounter for screening for other disorder: Secondary | ICD-10-CM | POA: Diagnosis not present

## 2020-03-15 DIAGNOSIS — F1020 Alcohol dependence, uncomplicated: Secondary | ICD-10-CM | POA: Diagnosis not present

## 2020-03-15 DIAGNOSIS — F900 Attention-deficit hyperactivity disorder, predominantly inattentive type: Secondary | ICD-10-CM | POA: Diagnosis not present

## 2020-03-15 DIAGNOSIS — R6520 Severe sepsis without septic shock: Secondary | ICD-10-CM | POA: Diagnosis not present

## 2020-03-15 DIAGNOSIS — H8110 Benign paroxysmal vertigo, unspecified ear: Secondary | ICD-10-CM | POA: Diagnosis not present

## 2020-03-15 DIAGNOSIS — F3342 Major depressive disorder, recurrent, in full remission: Secondary | ICD-10-CM | POA: Diagnosis not present

## 2020-03-15 DIAGNOSIS — H4423 Degenerative myopia, bilateral: Secondary | ICD-10-CM | POA: Diagnosis not present

## 2020-03-15 DIAGNOSIS — M859 Disorder of bone density and structure, unspecified: Secondary | ICD-10-CM | POA: Diagnosis not present

## 2020-03-15 DIAGNOSIS — G8929 Other chronic pain: Secondary | ICD-10-CM | POA: Diagnosis not present

## 2020-03-15 DIAGNOSIS — D519 Vitamin B12 deficiency anemia, unspecified: Secondary | ICD-10-CM | POA: Diagnosis not present

## 2020-03-15 DIAGNOSIS — C50911 Malignant neoplasm of unspecified site of right female breast: Secondary | ICD-10-CM | POA: Diagnosis not present

## 2020-03-15 DIAGNOSIS — H903 Sensorineural hearing loss, bilateral: Secondary | ICD-10-CM | POA: Diagnosis not present

## 2020-03-15 DIAGNOSIS — M4626 Osteomyelitis of vertebra, lumbar region: Secondary | ICD-10-CM | POA: Diagnosis not present

## 2020-03-15 DIAGNOSIS — B372 Candidiasis of skin and nail: Secondary | ICD-10-CM | POA: Diagnosis not present

## 2020-03-15 DIAGNOSIS — I50812 Chronic right heart failure: Secondary | ICD-10-CM | POA: Diagnosis not present

## 2020-03-15 DIAGNOSIS — I442 Atrioventricular block, complete: Secondary | ICD-10-CM | POA: Diagnosis not present

## 2020-03-15 DIAGNOSIS — M3500 Sicca syndrome, unspecified: Secondary | ICD-10-CM | POA: Diagnosis not present

## 2020-03-15 DIAGNOSIS — D631 Anemia in chronic kidney disease: Secondary | ICD-10-CM | POA: Diagnosis not present

## 2020-03-15 DIAGNOSIS — Z7982 Long term (current) use of aspirin: Secondary | ICD-10-CM | POA: Diagnosis not present

## 2020-03-15 DIAGNOSIS — E0821 Diabetes mellitus due to underlying condition with diabetic nephropathy: Secondary | ICD-10-CM | POA: Diagnosis not present

## 2020-03-15 DIAGNOSIS — I25810 Atherosclerosis of coronary artery bypass graft(s) without angina pectoris: Secondary | ICD-10-CM | POA: Diagnosis not present

## 2020-03-15 DIAGNOSIS — H539 Unspecified visual disturbance: Secondary | ICD-10-CM | POA: Diagnosis not present

## 2020-03-15 DIAGNOSIS — M7061 Trochanteric bursitis, right hip: Secondary | ICD-10-CM | POA: Diagnosis not present

## 2020-03-15 DIAGNOSIS — S42214A Unspecified nondisplaced fracture of surgical neck of right humerus, initial encounter for closed fracture: Secondary | ICD-10-CM | POA: Diagnosis not present

## 2020-03-15 DIAGNOSIS — E1140 Type 2 diabetes mellitus with diabetic neuropathy, unspecified: Secondary | ICD-10-CM | POA: Diagnosis not present

## 2020-03-15 DIAGNOSIS — G9340 Encephalopathy, unspecified: Secondary | ICD-10-CM | POA: Diagnosis not present

## 2020-03-15 DIAGNOSIS — L812 Freckles: Secondary | ICD-10-CM | POA: Diagnosis not present

## 2020-03-15 DIAGNOSIS — H43812 Vitreous degeneration, left eye: Secondary | ICD-10-CM | POA: Diagnosis not present

## 2020-03-15 DIAGNOSIS — Z8616 Personal history of COVID-19: Secondary | ICD-10-CM | POA: Diagnosis not present

## 2020-03-15 DIAGNOSIS — C181 Malignant neoplasm of appendix: Secondary | ICD-10-CM | POA: Diagnosis not present

## 2020-03-15 DIAGNOSIS — R55 Syncope and collapse: Secondary | ICD-10-CM | POA: Diagnosis not present

## 2020-03-15 DIAGNOSIS — R278 Other lack of coordination: Secondary | ICD-10-CM | POA: Diagnosis not present

## 2020-03-15 DIAGNOSIS — G3281 Cerebellar ataxia in diseases classified elsewhere: Secondary | ICD-10-CM | POA: Diagnosis not present

## 2020-03-15 DIAGNOSIS — F1021 Alcohol dependence, in remission: Secondary | ICD-10-CM | POA: Diagnosis not present

## 2020-03-15 DIAGNOSIS — M48062 Spinal stenosis, lumbar region with neurogenic claudication: Secondary | ICD-10-CM | POA: Diagnosis not present

## 2020-03-15 DIAGNOSIS — H5203 Hypermetropia, bilateral: Secondary | ICD-10-CM | POA: Diagnosis not present

## 2020-03-15 DIAGNOSIS — F3289 Other specified depressive episodes: Secondary | ICD-10-CM | POA: Diagnosis not present

## 2020-03-15 DIAGNOSIS — K922 Gastrointestinal hemorrhage, unspecified: Secondary | ICD-10-CM | POA: Diagnosis not present

## 2020-03-15 DIAGNOSIS — I0981 Rheumatic heart failure: Secondary | ICD-10-CM | POA: Diagnosis not present

## 2020-03-15 DIAGNOSIS — K3184 Gastroparesis: Secondary | ICD-10-CM | POA: Diagnosis not present

## 2020-03-15 DIAGNOSIS — L4050 Arthropathic psoriasis, unspecified: Secondary | ICD-10-CM | POA: Diagnosis not present

## 2020-03-15 DIAGNOSIS — N3946 Mixed incontinence: Secondary | ICD-10-CM | POA: Diagnosis not present

## 2020-03-15 DIAGNOSIS — F0150 Vascular dementia without behavioral disturbance: Secondary | ICD-10-CM | POA: Diagnosis not present

## 2020-03-15 DIAGNOSIS — R0789 Other chest pain: Secondary | ICD-10-CM | POA: Diagnosis not present

## 2020-03-15 DIAGNOSIS — D2310 Other benign neoplasm of skin of unspecified eyelid, including canthus: Secondary | ICD-10-CM | POA: Diagnosis not present

## 2020-03-15 DIAGNOSIS — Z7951 Long term (current) use of inhaled steroids: Secondary | ICD-10-CM | POA: Diagnosis not present

## 2020-03-15 DIAGNOSIS — G500 Trigeminal neuralgia: Secondary | ICD-10-CM | POA: Diagnosis not present

## 2020-03-15 DIAGNOSIS — R1012 Left upper quadrant pain: Secondary | ICD-10-CM | POA: Diagnosis not present

## 2020-03-15 DIAGNOSIS — G309 Alzheimer's disease, unspecified: Secondary | ICD-10-CM | POA: Diagnosis not present

## 2020-03-15 DIAGNOSIS — F5104 Psychophysiologic insomnia: Secondary | ICD-10-CM | POA: Diagnosis not present

## 2020-03-15 DIAGNOSIS — R079 Chest pain, unspecified: Secondary | ICD-10-CM | POA: Diagnosis not present

## 2020-03-15 DIAGNOSIS — F251 Schizoaffective disorder, depressive type: Secondary | ICD-10-CM | POA: Diagnosis not present

## 2020-03-15 DIAGNOSIS — H401134 Primary open-angle glaucoma, bilateral, indeterminate stage: Secondary | ICD-10-CM | POA: Diagnosis not present

## 2020-03-15 DIAGNOSIS — Z85118 Personal history of other malignant neoplasm of bronchus and lung: Secondary | ICD-10-CM | POA: Diagnosis not present

## 2020-03-15 DIAGNOSIS — N289 Disorder of kidney and ureter, unspecified: Secondary | ICD-10-CM | POA: Diagnosis not present

## 2020-03-15 DIAGNOSIS — Z6838 Body mass index (BMI) 38.0-38.9, adult: Secondary | ICD-10-CM | POA: Diagnosis not present

## 2020-03-15 DIAGNOSIS — Z880 Allergy status to penicillin: Secondary | ICD-10-CM | POA: Diagnosis not present

## 2020-03-15 DIAGNOSIS — F419 Anxiety disorder, unspecified: Secondary | ICD-10-CM | POA: Diagnosis not present

## 2020-03-15 DIAGNOSIS — E113412 Type 2 diabetes mellitus with severe nonproliferative diabetic retinopathy with macular edema, left eye: Secondary | ICD-10-CM | POA: Diagnosis not present

## 2020-03-15 DIAGNOSIS — M62512 Muscle wasting and atrophy, not elsewhere classified, left shoulder: Secondary | ICD-10-CM | POA: Diagnosis not present

## 2020-03-15 DIAGNOSIS — R928 Other abnormal and inconclusive findings on diagnostic imaging of breast: Secondary | ICD-10-CM | POA: Diagnosis not present

## 2020-03-15 DIAGNOSIS — Z20828 Contact with and (suspected) exposure to other viral communicable diseases: Secondary | ICD-10-CM | POA: Diagnosis not present

## 2020-03-15 DIAGNOSIS — M86172 Other acute osteomyelitis, left ankle and foot: Secondary | ICD-10-CM | POA: Diagnosis not present

## 2020-03-15 DIAGNOSIS — M25562 Pain in left knee: Secondary | ICD-10-CM | POA: Diagnosis not present

## 2020-03-15 DIAGNOSIS — Z7984 Long term (current) use of oral hypoglycemic drugs: Secondary | ICD-10-CM | POA: Diagnosis not present

## 2020-03-15 DIAGNOSIS — N952 Postmenopausal atrophic vaginitis: Secondary | ICD-10-CM | POA: Diagnosis not present

## 2020-03-15 DIAGNOSIS — H02886 Meibomian gland dysfunction of left eye, unspecified eyelid: Secondary | ICD-10-CM | POA: Diagnosis not present

## 2020-03-15 DIAGNOSIS — K5900 Constipation, unspecified: Secondary | ICD-10-CM | POA: Diagnosis not present

## 2020-03-15 DIAGNOSIS — R768 Other specified abnormal immunological findings in serum: Secondary | ICD-10-CM | POA: Diagnosis not present

## 2020-03-15 DIAGNOSIS — S0086XA Insect bite (nonvenomous) of other part of head, initial encounter: Secondary | ICD-10-CM | POA: Diagnosis not present

## 2020-03-15 DIAGNOSIS — L578 Other skin changes due to chronic exposure to nonionizing radiation: Secondary | ICD-10-CM | POA: Diagnosis not present

## 2020-03-15 DIAGNOSIS — R42 Dizziness and giddiness: Secondary | ICD-10-CM | POA: Diagnosis not present

## 2020-03-15 DIAGNOSIS — Z1152 Encounter for screening for COVID-19: Secondary | ICD-10-CM | POA: Diagnosis not present

## 2020-03-16 DIAGNOSIS — M5116 Intervertebral disc disorders with radiculopathy, lumbar region: Secondary | ICD-10-CM | POA: Diagnosis not present

## 2020-03-16 DIAGNOSIS — H2512 Age-related nuclear cataract, left eye: Secondary | ICD-10-CM | POA: Diagnosis not present

## 2020-03-16 DIAGNOSIS — R769 Abnormal immunological finding in serum, unspecified: Secondary | ICD-10-CM | POA: Diagnosis not present

## 2020-03-16 DIAGNOSIS — I77819 Aortic ectasia, unspecified site: Secondary | ICD-10-CM | POA: Diagnosis not present

## 2020-03-16 DIAGNOSIS — H5212 Myopia, left eye: Secondary | ICD-10-CM | POA: Diagnosis not present

## 2020-03-16 DIAGNOSIS — M5030 Other cervical disc degeneration, unspecified cervical region: Secondary | ICD-10-CM | POA: Diagnosis not present

## 2020-03-16 DIAGNOSIS — E7800 Pure hypercholesterolemia, unspecified: Secondary | ICD-10-CM | POA: Diagnosis not present

## 2020-03-16 DIAGNOSIS — M2241 Chondromalacia patellae, right knee: Secondary | ICD-10-CM | POA: Diagnosis not present

## 2020-03-16 DIAGNOSIS — R251 Tremor, unspecified: Secondary | ICD-10-CM | POA: Diagnosis not present

## 2020-03-16 DIAGNOSIS — M79651 Pain in right thigh: Secondary | ICD-10-CM | POA: Diagnosis not present

## 2020-03-16 DIAGNOSIS — K5903 Drug induced constipation: Secondary | ICD-10-CM | POA: Diagnosis not present

## 2020-03-16 DIAGNOSIS — H0100A Unspecified blepharitis right eye, upper and lower eyelids: Secondary | ICD-10-CM | POA: Diagnosis not present

## 2020-03-16 DIAGNOSIS — R2232 Localized swelling, mass and lump, left upper limb: Secondary | ICD-10-CM | POA: Diagnosis not present

## 2020-03-16 DIAGNOSIS — Z85528 Personal history of other malignant neoplasm of kidney: Secondary | ICD-10-CM | POA: Diagnosis not present

## 2020-03-16 DIAGNOSIS — R630 Anorexia: Secondary | ICD-10-CM | POA: Diagnosis not present

## 2020-03-16 DIAGNOSIS — L97521 Non-pressure chronic ulcer of other part of left foot limited to breakdown of skin: Secondary | ICD-10-CM | POA: Diagnosis not present

## 2020-03-16 DIAGNOSIS — C250 Malignant neoplasm of head of pancreas: Secondary | ICD-10-CM | POA: Diagnosis not present

## 2020-03-16 DIAGNOSIS — H5202 Hypermetropia, left eye: Secondary | ICD-10-CM | POA: Diagnosis not present

## 2020-03-16 DIAGNOSIS — Z86010 Personal history of colonic polyps: Secondary | ICD-10-CM | POA: Diagnosis not present

## 2020-03-16 DIAGNOSIS — H40003 Preglaucoma, unspecified, bilateral: Secondary | ICD-10-CM | POA: Diagnosis not present

## 2020-03-16 DIAGNOSIS — C8590 Non-Hodgkin lymphoma, unspecified, unspecified site: Secondary | ICD-10-CM | POA: Diagnosis not present

## 2020-03-16 DIAGNOSIS — N6452 Nipple discharge: Secondary | ICD-10-CM | POA: Diagnosis not present

## 2020-03-16 DIAGNOSIS — S42202D Unspecified fracture of upper end of left humerus, subsequent encounter for fracture with routine healing: Secondary | ICD-10-CM | POA: Diagnosis not present

## 2020-03-16 DIAGNOSIS — D582 Other hemoglobinopathies: Secondary | ICD-10-CM | POA: Diagnosis not present

## 2020-03-16 DIAGNOSIS — Z7409 Other reduced mobility: Secondary | ICD-10-CM | POA: Diagnosis not present

## 2020-03-16 DIAGNOSIS — H52222 Regular astigmatism, left eye: Secondary | ICD-10-CM | POA: Diagnosis not present

## 2020-03-16 DIAGNOSIS — E611 Iron deficiency: Secondary | ICD-10-CM | POA: Diagnosis not present

## 2020-03-16 DIAGNOSIS — S0501XA Injury of conjunctiva and corneal abrasion without foreign body, right eye, initial encounter: Secondary | ICD-10-CM | POA: Diagnosis not present

## 2020-03-16 DIAGNOSIS — N10 Acute tubulo-interstitial nephritis: Secondary | ICD-10-CM | POA: Diagnosis not present

## 2020-03-16 DIAGNOSIS — M9901 Segmental and somatic dysfunction of cervical region: Secondary | ICD-10-CM | POA: Diagnosis not present

## 2020-03-16 DIAGNOSIS — Z125 Encounter for screening for malignant neoplasm of prostate: Secondary | ICD-10-CM | POA: Diagnosis not present

## 2020-03-16 DIAGNOSIS — H401132 Primary open-angle glaucoma, bilateral, moderate stage: Secondary | ICD-10-CM | POA: Diagnosis not present

## 2020-03-16 DIAGNOSIS — K2101 Gastro-esophageal reflux disease with esophagitis, with bleeding: Secondary | ICD-10-CM | POA: Diagnosis not present

## 2020-03-16 DIAGNOSIS — M109 Gout, unspecified: Secondary | ICD-10-CM | POA: Diagnosis not present

## 2020-03-16 DIAGNOSIS — F329 Major depressive disorder, single episode, unspecified: Secondary | ICD-10-CM | POA: Diagnosis not present

## 2020-03-16 DIAGNOSIS — M25532 Pain in left wrist: Secondary | ICD-10-CM | POA: Diagnosis not present

## 2020-03-16 DIAGNOSIS — H0102A Squamous blepharitis right eye, upper and lower eyelids: Secondary | ICD-10-CM | POA: Diagnosis not present

## 2020-03-16 DIAGNOSIS — R2230 Localized swelling, mass and lump, unspecified upper limb: Secondary | ICD-10-CM | POA: Diagnosis not present

## 2020-03-16 DIAGNOSIS — H6123 Impacted cerumen, bilateral: Secondary | ICD-10-CM | POA: Diagnosis not present

## 2020-03-16 DIAGNOSIS — M7661 Achilles tendinitis, right leg: Secondary | ICD-10-CM | POA: Diagnosis not present

## 2020-03-16 DIAGNOSIS — Z86000 Personal history of in-situ neoplasm of breast: Secondary | ICD-10-CM | POA: Diagnosis not present

## 2020-03-16 DIAGNOSIS — M1712 Unilateral primary osteoarthritis, left knee: Secondary | ICD-10-CM | POA: Diagnosis not present

## 2020-03-16 DIAGNOSIS — R600 Localized edema: Secondary | ICD-10-CM | POA: Diagnosis not present

## 2020-03-16 DIAGNOSIS — E083513 Diabetes mellitus due to underlying condition with proliferative diabetic retinopathy with macular edema, bilateral: Secondary | ICD-10-CM | POA: Diagnosis not present

## 2020-03-16 DIAGNOSIS — S42201A Unspecified fracture of upper end of right humerus, initial encounter for closed fracture: Secondary | ICD-10-CM | POA: Diagnosis not present

## 2020-03-16 DIAGNOSIS — N398 Other specified disorders of urinary system: Secondary | ICD-10-CM | POA: Diagnosis not present

## 2020-03-16 DIAGNOSIS — K648 Other hemorrhoids: Secondary | ICD-10-CM | POA: Diagnosis not present

## 2020-03-16 DIAGNOSIS — E876 Hypokalemia: Secondary | ICD-10-CM | POA: Diagnosis not present

## 2020-03-16 DIAGNOSIS — H8113 Benign paroxysmal vertigo, bilateral: Secondary | ICD-10-CM | POA: Diagnosis not present

## 2020-03-16 DIAGNOSIS — E1142 Type 2 diabetes mellitus with diabetic polyneuropathy: Secondary | ICD-10-CM | POA: Diagnosis not present

## 2020-03-16 DIAGNOSIS — Z6830 Body mass index (BMI) 30.0-30.9, adult: Secondary | ICD-10-CM | POA: Diagnosis not present

## 2020-03-16 DIAGNOSIS — S322XXA Fracture of coccyx, initial encounter for closed fracture: Secondary | ICD-10-CM | POA: Diagnosis not present

## 2020-03-16 DIAGNOSIS — D2371 Other benign neoplasm of skin of right lower limb, including hip: Secondary | ICD-10-CM | POA: Diagnosis not present

## 2020-03-16 DIAGNOSIS — F0281 Dementia in other diseases classified elsewhere with behavioral disturbance: Secondary | ICD-10-CM | POA: Diagnosis not present

## 2020-03-16 DIAGNOSIS — R072 Precordial pain: Secondary | ICD-10-CM | POA: Diagnosis not present

## 2020-03-16 DIAGNOSIS — K5900 Constipation, unspecified: Secondary | ICD-10-CM | POA: Diagnosis not present

## 2020-03-16 DIAGNOSIS — R935 Abnormal findings on diagnostic imaging of other abdominal regions, including retroperitoneum: Secondary | ICD-10-CM | POA: Diagnosis not present

## 2020-03-16 DIAGNOSIS — G43009 Migraine without aura, not intractable, without status migrainosus: Secondary | ICD-10-CM | POA: Diagnosis not present

## 2020-03-16 DIAGNOSIS — E11311 Type 2 diabetes mellitus with unspecified diabetic retinopathy with macular edema: Secondary | ICD-10-CM | POA: Diagnosis not present

## 2020-03-16 DIAGNOSIS — H0102B Squamous blepharitis left eye, upper and lower eyelids: Secondary | ICD-10-CM | POA: Diagnosis not present

## 2020-03-16 DIAGNOSIS — M25531 Pain in right wrist: Secondary | ICD-10-CM | POA: Diagnosis not present

## 2020-03-16 DIAGNOSIS — M79644 Pain in right finger(s): Secondary | ICD-10-CM | POA: Diagnosis not present

## 2020-03-16 DIAGNOSIS — H34831 Tributary (branch) retinal vein occlusion, right eye, with macular edema: Secondary | ICD-10-CM | POA: Diagnosis not present

## 2020-03-16 DIAGNOSIS — H40053 Ocular hypertension, bilateral: Secondary | ICD-10-CM | POA: Diagnosis not present

## 2020-03-16 DIAGNOSIS — N8111 Cystocele, midline: Secondary | ICD-10-CM | POA: Diagnosis not present

## 2020-03-16 DIAGNOSIS — H53021 Refractive amblyopia, right eye: Secondary | ICD-10-CM | POA: Diagnosis not present

## 2020-03-16 DIAGNOSIS — M5416 Radiculopathy, lumbar region: Secondary | ICD-10-CM | POA: Diagnosis not present

## 2020-03-16 DIAGNOSIS — T1500XA Foreign body in cornea, unspecified eye, initial encounter: Secondary | ICD-10-CM | POA: Diagnosis not present

## 2020-03-16 DIAGNOSIS — Z862 Personal history of diseases of the blood and blood-forming organs and certain disorders involving the immune mechanism: Secondary | ICD-10-CM | POA: Diagnosis not present

## 2020-03-16 DIAGNOSIS — I4821 Permanent atrial fibrillation: Secondary | ICD-10-CM | POA: Diagnosis not present

## 2020-03-16 DIAGNOSIS — M65311 Trigger thumb, right thumb: Secondary | ICD-10-CM | POA: Diagnosis not present

## 2020-03-16 DIAGNOSIS — G5621 Lesion of ulnar nerve, right upper limb: Secondary | ICD-10-CM | POA: Diagnosis not present

## 2020-03-16 DIAGNOSIS — Z955 Presence of coronary angioplasty implant and graft: Secondary | ICD-10-CM | POA: Diagnosis not present

## 2020-03-16 DIAGNOSIS — R338 Other retention of urine: Secondary | ICD-10-CM | POA: Diagnosis not present

## 2020-03-16 DIAGNOSIS — R748 Abnormal levels of other serum enzymes: Secondary | ICD-10-CM | POA: Diagnosis not present

## 2020-03-16 DIAGNOSIS — I491 Atrial premature depolarization: Secondary | ICD-10-CM | POA: Diagnosis not present

## 2020-03-16 DIAGNOSIS — J9690 Respiratory failure, unspecified, unspecified whether with hypoxia or hypercapnia: Secondary | ICD-10-CM | POA: Diagnosis not present

## 2020-03-16 DIAGNOSIS — M353 Polymyalgia rheumatica: Secondary | ICD-10-CM | POA: Diagnosis not present

## 2020-03-16 DIAGNOSIS — M461 Sacroiliitis, not elsewhere classified: Secondary | ICD-10-CM | POA: Diagnosis not present

## 2020-03-16 DIAGNOSIS — I255 Ischemic cardiomyopathy: Secondary | ICD-10-CM | POA: Diagnosis not present

## 2020-03-16 DIAGNOSIS — D2239 Melanocytic nevi of other parts of face: Secondary | ICD-10-CM | POA: Diagnosis not present

## 2020-03-16 DIAGNOSIS — R351 Nocturia: Secondary | ICD-10-CM | POA: Diagnosis not present

## 2020-03-16 DIAGNOSIS — M792 Neuralgia and neuritis, unspecified: Secondary | ICD-10-CM | POA: Diagnosis not present

## 2020-03-16 DIAGNOSIS — M25512 Pain in left shoulder: Secondary | ICD-10-CM | POA: Diagnosis not present

## 2020-03-16 DIAGNOSIS — S42201D Unspecified fracture of upper end of right humerus, subsequent encounter for fracture with routine healing: Secondary | ICD-10-CM | POA: Diagnosis not present

## 2020-03-16 DIAGNOSIS — Z981 Arthrodesis status: Secondary | ICD-10-CM | POA: Diagnosis not present

## 2020-03-16 DIAGNOSIS — I119 Hypertensive heart disease without heart failure: Secondary | ICD-10-CM | POA: Diagnosis not present

## 2020-03-16 DIAGNOSIS — R1312 Dysphagia, oropharyngeal phase: Secondary | ICD-10-CM | POA: Diagnosis not present

## 2020-03-16 DIAGNOSIS — G540 Brachial plexus disorders: Secondary | ICD-10-CM | POA: Diagnosis not present

## 2020-03-16 DIAGNOSIS — M62830 Muscle spasm of back: Secondary | ICD-10-CM | POA: Diagnosis not present

## 2020-03-16 DIAGNOSIS — M19042 Primary osteoarthritis, left hand: Secondary | ICD-10-CM | POA: Diagnosis not present

## 2020-03-16 DIAGNOSIS — I447 Left bundle-branch block, unspecified: Secondary | ICD-10-CM | POA: Diagnosis not present

## 2020-03-16 DIAGNOSIS — I739 Peripheral vascular disease, unspecified: Secondary | ICD-10-CM | POA: Diagnosis not present

## 2020-03-16 DIAGNOSIS — H353134 Nonexudative age-related macular degeneration, bilateral, advanced atrophic with subfoveal involvement: Secondary | ICD-10-CM | POA: Diagnosis not present

## 2020-03-16 DIAGNOSIS — N401 Enlarged prostate with lower urinary tract symptoms: Secondary | ICD-10-CM | POA: Diagnosis not present

## 2020-03-16 DIAGNOSIS — M1610 Unilateral primary osteoarthritis, unspecified hip: Secondary | ICD-10-CM | POA: Diagnosis not present

## 2020-03-16 DIAGNOSIS — S83231A Complex tear of medial meniscus, current injury, right knee, initial encounter: Secondary | ICD-10-CM | POA: Diagnosis not present

## 2020-03-16 DIAGNOSIS — Z8719 Personal history of other diseases of the digestive system: Secondary | ICD-10-CM | POA: Diagnosis not present

## 2020-03-16 DIAGNOSIS — S46011A Strain of muscle(s) and tendon(s) of the rotator cuff of right shoulder, initial encounter: Secondary | ICD-10-CM | POA: Diagnosis not present

## 2020-03-16 DIAGNOSIS — M24661 Ankylosis, right knee: Secondary | ICD-10-CM | POA: Diagnosis not present

## 2020-03-16 DIAGNOSIS — M8949 Other hypertrophic osteoarthropathy, multiple sites: Secondary | ICD-10-CM | POA: Diagnosis not present

## 2020-03-16 DIAGNOSIS — D414 Neoplasm of uncertain behavior of bladder: Secondary | ICD-10-CM | POA: Diagnosis not present

## 2020-03-16 DIAGNOSIS — K1121 Acute sialoadenitis: Secondary | ICD-10-CM | POA: Diagnosis not present

## 2020-03-16 DIAGNOSIS — Z2821 Immunization not carried out because of patient refusal: Secondary | ICD-10-CM | POA: Diagnosis not present

## 2020-03-16 DIAGNOSIS — J040 Acute laryngitis: Secondary | ICD-10-CM | POA: Diagnosis not present

## 2020-03-16 DIAGNOSIS — R140 Abdominal distension (gaseous): Secondary | ICD-10-CM | POA: Diagnosis not present

## 2020-03-16 DIAGNOSIS — G40219 Localization-related (focal) (partial) symptomatic epilepsy and epileptic syndromes with complex partial seizures, intractable, without status epilepticus: Secondary | ICD-10-CM | POA: Diagnosis not present

## 2020-03-16 DIAGNOSIS — M9906 Segmental and somatic dysfunction of lower extremity: Secondary | ICD-10-CM | POA: Diagnosis not present

## 2020-03-16 DIAGNOSIS — L03116 Cellulitis of left lower limb: Secondary | ICD-10-CM | POA: Diagnosis not present

## 2020-03-16 DIAGNOSIS — G5603 Carpal tunnel syndrome, bilateral upper limbs: Secondary | ICD-10-CM | POA: Diagnosis not present

## 2020-03-16 DIAGNOSIS — M4120 Other idiopathic scoliosis, site unspecified: Secondary | ICD-10-CM | POA: Diagnosis not present

## 2020-03-16 DIAGNOSIS — H11123 Conjunctival concretions, bilateral: Secondary | ICD-10-CM | POA: Diagnosis not present

## 2020-03-16 DIAGNOSIS — Z6836 Body mass index (BMI) 36.0-36.9, adult: Secondary | ICD-10-CM | POA: Diagnosis not present

## 2020-03-16 DIAGNOSIS — E559 Vitamin D deficiency, unspecified: Secondary | ICD-10-CM | POA: Diagnosis not present

## 2020-03-16 DIAGNOSIS — Z23 Encounter for immunization: Secondary | ICD-10-CM | POA: Diagnosis not present

## 2020-03-16 DIAGNOSIS — N400 Enlarged prostate without lower urinary tract symptoms: Secondary | ICD-10-CM | POA: Diagnosis not present

## 2020-03-16 DIAGNOSIS — I480 Paroxysmal atrial fibrillation: Secondary | ICD-10-CM | POA: Diagnosis not present

## 2020-03-16 DIAGNOSIS — F1123 Opioid dependence with withdrawal: Secondary | ICD-10-CM | POA: Diagnosis not present

## 2020-03-16 DIAGNOSIS — I471 Supraventricular tachycardia: Secondary | ICD-10-CM | POA: Diagnosis not present

## 2020-03-16 DIAGNOSIS — J181 Lobar pneumonia, unspecified organism: Secondary | ICD-10-CM | POA: Diagnosis not present

## 2020-03-16 DIAGNOSIS — S72001D Fracture of unspecified part of neck of right femur, subsequent encounter for closed fracture with routine healing: Secondary | ICD-10-CM | POA: Diagnosis not present

## 2020-03-16 DIAGNOSIS — F1910 Other psychoactive substance abuse, uncomplicated: Secondary | ICD-10-CM | POA: Diagnosis not present

## 2020-03-16 DIAGNOSIS — L97312 Non-pressure chronic ulcer of right ankle with fat layer exposed: Secondary | ICD-10-CM | POA: Diagnosis not present

## 2020-03-16 DIAGNOSIS — Z0000 Encounter for general adult medical examination without abnormal findings: Secondary | ICD-10-CM | POA: Diagnosis not present

## 2020-03-16 DIAGNOSIS — Z803 Family history of malignant neoplasm of breast: Secondary | ICD-10-CM | POA: Diagnosis not present

## 2020-03-16 DIAGNOSIS — Z8781 Personal history of (healed) traumatic fracture: Secondary | ICD-10-CM | POA: Diagnosis not present

## 2020-03-16 DIAGNOSIS — H2513 Age-related nuclear cataract, bilateral: Secondary | ICD-10-CM | POA: Diagnosis not present

## 2020-03-16 DIAGNOSIS — L853 Xerosis cutis: Secondary | ICD-10-CM | POA: Diagnosis not present

## 2020-03-16 DIAGNOSIS — N182 Chronic kidney disease, stage 2 (mild): Secondary | ICD-10-CM | POA: Diagnosis not present

## 2020-03-16 DIAGNOSIS — K219 Gastro-esophageal reflux disease without esophagitis: Secondary | ICD-10-CM | POA: Diagnosis not present

## 2020-03-16 DIAGNOSIS — M7521 Bicipital tendinitis, right shoulder: Secondary | ICD-10-CM | POA: Diagnosis not present

## 2020-03-16 DIAGNOSIS — M9262 Juvenile osteochondrosis of tarsus, left ankle: Secondary | ICD-10-CM | POA: Diagnosis not present

## 2020-03-16 DIAGNOSIS — M11261 Other chondrocalcinosis, right knee: Secondary | ICD-10-CM | POA: Diagnosis not present

## 2020-03-16 DIAGNOSIS — B009 Herpesviral infection, unspecified: Secondary | ICD-10-CM | POA: Diagnosis not present

## 2020-03-16 DIAGNOSIS — H16212 Exposure keratoconjunctivitis, left eye: Secondary | ICD-10-CM | POA: Diagnosis not present

## 2020-03-16 DIAGNOSIS — R350 Frequency of micturition: Secondary | ICD-10-CM | POA: Diagnosis not present

## 2020-03-16 DIAGNOSIS — R410 Disorientation, unspecified: Secondary | ICD-10-CM | POA: Diagnosis not present

## 2020-03-16 DIAGNOSIS — R001 Bradycardia, unspecified: Secondary | ICD-10-CM | POA: Diagnosis not present

## 2020-03-16 DIAGNOSIS — I110 Hypertensive heart disease with heart failure: Secondary | ICD-10-CM | POA: Diagnosis not present

## 2020-03-16 DIAGNOSIS — K601 Chronic anal fissure: Secondary | ICD-10-CM | POA: Diagnosis not present

## 2020-03-16 DIAGNOSIS — R1310 Dysphagia, unspecified: Secondary | ICD-10-CM | POA: Diagnosis not present

## 2020-03-16 DIAGNOSIS — I455 Other specified heart block: Secondary | ICD-10-CM | POA: Diagnosis not present

## 2020-03-16 DIAGNOSIS — Z6841 Body Mass Index (BMI) 40.0 and over, adult: Secondary | ICD-10-CM | POA: Diagnosis not present

## 2020-03-16 DIAGNOSIS — R197 Diarrhea, unspecified: Secondary | ICD-10-CM | POA: Diagnosis not present

## 2020-03-16 DIAGNOSIS — N94819 Vulvodynia, unspecified: Secondary | ICD-10-CM | POA: Diagnosis not present

## 2020-03-16 DIAGNOSIS — E113293 Type 2 diabetes mellitus with mild nonproliferative diabetic retinopathy without macular edema, bilateral: Secondary | ICD-10-CM | POA: Diagnosis not present

## 2020-03-16 DIAGNOSIS — C7951 Secondary malignant neoplasm of bone: Secondary | ICD-10-CM | POA: Diagnosis not present

## 2020-03-16 DIAGNOSIS — L812 Freckles: Secondary | ICD-10-CM | POA: Diagnosis not present

## 2020-03-16 DIAGNOSIS — Z86718 Personal history of other venous thrombosis and embolism: Secondary | ICD-10-CM | POA: Diagnosis not present

## 2020-03-16 DIAGNOSIS — M2041 Other hammer toe(s) (acquired), right foot: Secondary | ICD-10-CM | POA: Diagnosis not present

## 2020-03-16 DIAGNOSIS — L304 Erythema intertrigo: Secondary | ICD-10-CM | POA: Diagnosis not present

## 2020-03-16 DIAGNOSIS — C4371 Malignant melanoma of right lower limb, including hip: Secondary | ICD-10-CM | POA: Diagnosis not present

## 2020-03-16 DIAGNOSIS — G459 Transient cerebral ischemic attack, unspecified: Secondary | ICD-10-CM | POA: Diagnosis not present

## 2020-03-16 DIAGNOSIS — L280 Lichen simplex chronicus: Secondary | ICD-10-CM | POA: Diagnosis not present

## 2020-03-16 DIAGNOSIS — H401134 Primary open-angle glaucoma, bilateral, indeterminate stage: Secondary | ICD-10-CM | POA: Diagnosis not present

## 2020-03-16 DIAGNOSIS — F331 Major depressive disorder, recurrent, moderate: Secondary | ICD-10-CM | POA: Diagnosis not present

## 2020-03-16 DIAGNOSIS — K228 Other specified diseases of esophagus: Secondary | ICD-10-CM | POA: Diagnosis not present

## 2020-03-16 DIAGNOSIS — R911 Solitary pulmonary nodule: Secondary | ICD-10-CM | POA: Diagnosis not present

## 2020-03-16 DIAGNOSIS — R801 Persistent proteinuria, unspecified: Secondary | ICD-10-CM | POA: Diagnosis not present

## 2020-03-16 DIAGNOSIS — S32501D Unspecified fracture of right pubis, subsequent encounter for fracture with routine healing: Secondary | ICD-10-CM | POA: Diagnosis not present

## 2020-03-16 DIAGNOSIS — Z01810 Encounter for preprocedural cardiovascular examination: Secondary | ICD-10-CM | POA: Diagnosis not present

## 2020-03-16 DIAGNOSIS — I4819 Other persistent atrial fibrillation: Secondary | ICD-10-CM | POA: Diagnosis not present

## 2020-03-16 DIAGNOSIS — I10 Essential (primary) hypertension: Secondary | ICD-10-CM | POA: Diagnosis not present

## 2020-03-16 DIAGNOSIS — R311 Benign essential microscopic hematuria: Secondary | ICD-10-CM | POA: Diagnosis not present

## 2020-03-16 DIAGNOSIS — N888 Other specified noninflammatory disorders of cervix uteri: Secondary | ICD-10-CM | POA: Diagnosis not present

## 2020-03-16 DIAGNOSIS — I7789 Other specified disorders of arteries and arterioles: Secondary | ICD-10-CM | POA: Diagnosis not present

## 2020-03-16 DIAGNOSIS — Z6829 Body mass index (BMI) 29.0-29.9, adult: Secondary | ICD-10-CM | POA: Diagnosis not present

## 2020-03-16 DIAGNOSIS — R1032 Left lower quadrant pain: Secondary | ICD-10-CM | POA: Diagnosis not present

## 2020-03-16 DIAGNOSIS — N189 Chronic kidney disease, unspecified: Secondary | ICD-10-CM | POA: Diagnosis not present

## 2020-03-16 DIAGNOSIS — H35371 Puckering of macula, right eye: Secondary | ICD-10-CM | POA: Diagnosis not present

## 2020-03-16 DIAGNOSIS — J4530 Mild persistent asthma, uncomplicated: Secondary | ICD-10-CM | POA: Diagnosis not present

## 2020-03-16 DIAGNOSIS — D45 Polycythemia vera: Secondary | ICD-10-CM | POA: Diagnosis not present

## 2020-03-16 DIAGNOSIS — L299 Pruritus, unspecified: Secondary | ICD-10-CM | POA: Diagnosis not present

## 2020-03-16 DIAGNOSIS — I130 Hypertensive heart and chronic kidney disease with heart failure and stage 1 through stage 4 chronic kidney disease, or unspecified chronic kidney disease: Secondary | ICD-10-CM | POA: Diagnosis not present

## 2020-03-16 DIAGNOSIS — I482 Chronic atrial fibrillation, unspecified: Secondary | ICD-10-CM | POA: Diagnosis not present

## 2020-03-16 DIAGNOSIS — K5730 Diverticulosis of large intestine without perforation or abscess without bleeding: Secondary | ICD-10-CM | POA: Diagnosis not present

## 2020-03-16 DIAGNOSIS — J438 Other emphysema: Secondary | ICD-10-CM | POA: Diagnosis not present

## 2020-03-16 DIAGNOSIS — C799 Secondary malignant neoplasm of unspecified site: Secondary | ICD-10-CM | POA: Diagnosis not present

## 2020-03-16 DIAGNOSIS — H02831 Dermatochalasis of right upper eyelid: Secondary | ICD-10-CM | POA: Diagnosis not present

## 2020-03-16 DIAGNOSIS — R739 Hyperglycemia, unspecified: Secondary | ICD-10-CM | POA: Diagnosis not present

## 2020-03-16 DIAGNOSIS — M533 Sacrococcygeal disorders, not elsewhere classified: Secondary | ICD-10-CM | POA: Diagnosis not present

## 2020-03-16 DIAGNOSIS — S32009D Unspecified fracture of unspecified lumbar vertebra, subsequent encounter for fracture with routine healing: Secondary | ICD-10-CM | POA: Diagnosis not present

## 2020-03-16 DIAGNOSIS — M542 Cervicalgia: Secondary | ICD-10-CM | POA: Diagnosis not present

## 2020-03-16 DIAGNOSIS — Z87891 Personal history of nicotine dependence: Secondary | ICD-10-CM | POA: Diagnosis not present

## 2020-03-16 DIAGNOSIS — S29012A Strain of muscle and tendon of back wall of thorax, initial encounter: Secondary | ICD-10-CM | POA: Diagnosis not present

## 2020-03-16 DIAGNOSIS — M48062 Spinal stenosis, lumbar region with neurogenic claudication: Secondary | ICD-10-CM | POA: Diagnosis not present

## 2020-03-16 DIAGNOSIS — Z79811 Long term (current) use of aromatase inhibitors: Secondary | ICD-10-CM | POA: Diagnosis not present

## 2020-03-16 DIAGNOSIS — L810 Postinflammatory hyperpigmentation: Secondary | ICD-10-CM | POA: Diagnosis not present

## 2020-03-16 DIAGNOSIS — S46001D Unspecified injury of muscle(s) and tendon(s) of the rotator cuff of right shoulder, subsequent encounter: Secondary | ICD-10-CM | POA: Diagnosis not present

## 2020-03-16 DIAGNOSIS — Z8673 Personal history of transient ischemic attack (TIA), and cerebral infarction without residual deficits: Secondary | ICD-10-CM | POA: Diagnosis not present

## 2020-03-16 DIAGNOSIS — H3554 Dystrophies primarily involving the retinal pigment epithelium: Secondary | ICD-10-CM | POA: Diagnosis not present

## 2020-03-16 DIAGNOSIS — D860 Sarcoidosis of lung: Secondary | ICD-10-CM | POA: Diagnosis not present

## 2020-03-16 DIAGNOSIS — M5431 Sciatica, right side: Secondary | ICD-10-CM | POA: Diagnosis not present

## 2020-03-16 DIAGNOSIS — M7061 Trochanteric bursitis, right hip: Secondary | ICD-10-CM | POA: Diagnosis not present

## 2020-03-16 DIAGNOSIS — M436 Torticollis: Secondary | ICD-10-CM | POA: Diagnosis not present

## 2020-03-16 DIAGNOSIS — I872 Venous insufficiency (chronic) (peripheral): Secondary | ICD-10-CM | POA: Diagnosis not present

## 2020-03-16 DIAGNOSIS — M5441 Lumbago with sciatica, right side: Secondary | ICD-10-CM | POA: Diagnosis not present

## 2020-03-16 DIAGNOSIS — H353231 Exudative age-related macular degeneration, bilateral, with active choroidal neovascularization: Secondary | ICD-10-CM | POA: Diagnosis not present

## 2020-03-16 DIAGNOSIS — K8050 Calculus of bile duct without cholangitis or cholecystitis without obstruction: Secondary | ICD-10-CM | POA: Diagnosis not present

## 2020-03-16 DIAGNOSIS — H53032 Strabismic amblyopia, left eye: Secondary | ICD-10-CM | POA: Diagnosis not present

## 2020-03-16 DIAGNOSIS — C7931 Secondary malignant neoplasm of brain: Secondary | ICD-10-CM | POA: Diagnosis not present

## 2020-03-16 DIAGNOSIS — Z720 Tobacco use: Secondary | ICD-10-CM | POA: Diagnosis not present

## 2020-03-16 DIAGNOSIS — M4316 Spondylolisthesis, lumbar region: Secondary | ICD-10-CM | POA: Diagnosis not present

## 2020-03-16 DIAGNOSIS — H353132 Nonexudative age-related macular degeneration, bilateral, intermediate dry stage: Secondary | ICD-10-CM | POA: Diagnosis not present

## 2020-03-16 DIAGNOSIS — I82402 Acute embolism and thrombosis of unspecified deep veins of left lower extremity: Secondary | ICD-10-CM | POA: Diagnosis not present

## 2020-03-16 DIAGNOSIS — N289 Disorder of kidney and ureter, unspecified: Secondary | ICD-10-CM | POA: Diagnosis not present

## 2020-03-16 DIAGNOSIS — H4312 Vitreous hemorrhage, left eye: Secondary | ICD-10-CM | POA: Diagnosis not present

## 2020-03-16 DIAGNOSIS — M7501 Adhesive capsulitis of right shoulder: Secondary | ICD-10-CM | POA: Diagnosis not present

## 2020-03-16 DIAGNOSIS — E119 Type 2 diabetes mellitus without complications: Secondary | ICD-10-CM | POA: Diagnosis not present

## 2020-03-16 DIAGNOSIS — G250 Essential tremor: Secondary | ICD-10-CM | POA: Diagnosis not present

## 2020-03-16 DIAGNOSIS — D49511 Neoplasm of unspecified behavior of right kidney: Secondary | ICD-10-CM | POA: Diagnosis not present

## 2020-03-16 DIAGNOSIS — L578 Other skin changes due to chronic exposure to nonionizing radiation: Secondary | ICD-10-CM | POA: Diagnosis not present

## 2020-03-16 DIAGNOSIS — R200 Anesthesia of skin: Secondary | ICD-10-CM | POA: Diagnosis not present

## 2020-03-16 DIAGNOSIS — F4310 Post-traumatic stress disorder, unspecified: Secondary | ICD-10-CM | POA: Diagnosis not present

## 2020-03-16 DIAGNOSIS — L603 Nail dystrophy: Secondary | ICD-10-CM | POA: Diagnosis not present

## 2020-03-16 DIAGNOSIS — R928 Other abnormal and inconclusive findings on diagnostic imaging of breast: Secondary | ICD-10-CM | POA: Diagnosis not present

## 2020-03-16 DIAGNOSIS — I5021 Acute systolic (congestive) heart failure: Secondary | ICD-10-CM | POA: Diagnosis not present

## 2020-03-16 DIAGNOSIS — G3184 Mild cognitive impairment, so stated: Secondary | ICD-10-CM | POA: Diagnosis not present

## 2020-03-16 DIAGNOSIS — D631 Anemia in chronic kidney disease: Secondary | ICD-10-CM | POA: Diagnosis not present

## 2020-03-16 DIAGNOSIS — Z681 Body mass index (BMI) 19 or less, adult: Secondary | ICD-10-CM | POA: Diagnosis not present

## 2020-03-16 DIAGNOSIS — R309 Painful micturition, unspecified: Secondary | ICD-10-CM | POA: Diagnosis not present

## 2020-03-16 DIAGNOSIS — I2699 Other pulmonary embolism without acute cor pulmonale: Secondary | ICD-10-CM | POA: Diagnosis not present

## 2020-03-16 DIAGNOSIS — H43311 Vitreous membranes and strands, right eye: Secondary | ICD-10-CM | POA: Diagnosis not present

## 2020-03-16 DIAGNOSIS — H35033 Hypertensive retinopathy, bilateral: Secondary | ICD-10-CM | POA: Diagnosis not present

## 2020-03-16 DIAGNOSIS — J441 Chronic obstructive pulmonary disease with (acute) exacerbation: Secondary | ICD-10-CM | POA: Diagnosis not present

## 2020-03-16 DIAGNOSIS — H35372 Puckering of macula, left eye: Secondary | ICD-10-CM | POA: Diagnosis not present

## 2020-03-16 DIAGNOSIS — E079 Disorder of thyroid, unspecified: Secondary | ICD-10-CM | POA: Diagnosis not present

## 2020-03-16 DIAGNOSIS — K4090 Unilateral inguinal hernia, without obstruction or gangrene, not specified as recurrent: Secondary | ICD-10-CM | POA: Diagnosis not present

## 2020-03-16 DIAGNOSIS — C50211 Malignant neoplasm of upper-inner quadrant of right female breast: Secondary | ICD-10-CM | POA: Diagnosis not present

## 2020-03-16 DIAGNOSIS — J019 Acute sinusitis, unspecified: Secondary | ICD-10-CM | POA: Diagnosis not present

## 2020-03-16 DIAGNOSIS — Z8639 Personal history of other endocrine, nutritional and metabolic disease: Secondary | ICD-10-CM | POA: Diagnosis not present

## 2020-03-16 DIAGNOSIS — L409 Psoriasis, unspecified: Secondary | ICD-10-CM | POA: Diagnosis not present

## 2020-03-16 DIAGNOSIS — L03011 Cellulitis of right finger: Secondary | ICD-10-CM | POA: Diagnosis not present

## 2020-03-16 DIAGNOSIS — R519 Headache, unspecified: Secondary | ICD-10-CM | POA: Diagnosis not present

## 2020-03-16 DIAGNOSIS — J90 Pleural effusion, not elsewhere classified: Secondary | ICD-10-CM | POA: Diagnosis not present

## 2020-03-16 DIAGNOSIS — D1801 Hemangioma of skin and subcutaneous tissue: Secondary | ICD-10-CM | POA: Diagnosis not present

## 2020-03-16 DIAGNOSIS — M25561 Pain in right knee: Secondary | ICD-10-CM | POA: Diagnosis not present

## 2020-03-16 DIAGNOSIS — I6389 Other cerebral infarction: Secondary | ICD-10-CM | POA: Diagnosis not present

## 2020-03-16 DIAGNOSIS — I493 Ventricular premature depolarization: Secondary | ICD-10-CM | POA: Diagnosis not present

## 2020-03-16 DIAGNOSIS — F4322 Adjustment disorder with anxiety: Secondary | ICD-10-CM | POA: Diagnosis not present

## 2020-03-16 DIAGNOSIS — H34211 Partial retinal artery occlusion, right eye: Secondary | ICD-10-CM | POA: Diagnosis not present

## 2020-03-16 DIAGNOSIS — Z8631 Personal history of diabetic foot ulcer: Secondary | ICD-10-CM | POA: Diagnosis not present

## 2020-03-16 DIAGNOSIS — N5201 Erectile dysfunction due to arterial insufficiency: Secondary | ICD-10-CM | POA: Diagnosis not present

## 2020-03-16 DIAGNOSIS — E1159 Type 2 diabetes mellitus with other circulatory complications: Secondary | ICD-10-CM | POA: Diagnosis not present

## 2020-03-16 DIAGNOSIS — E663 Overweight: Secondary | ICD-10-CM | POA: Diagnosis not present

## 2020-03-16 DIAGNOSIS — A419 Sepsis, unspecified organism: Secondary | ICD-10-CM | POA: Diagnosis not present

## 2020-03-16 DIAGNOSIS — H52221 Regular astigmatism, right eye: Secondary | ICD-10-CM | POA: Diagnosis not present

## 2020-03-16 DIAGNOSIS — F0391 Unspecified dementia with behavioral disturbance: Secondary | ICD-10-CM | POA: Diagnosis not present

## 2020-03-16 DIAGNOSIS — R922 Inconclusive mammogram: Secondary | ICD-10-CM | POA: Diagnosis not present

## 2020-03-16 DIAGNOSIS — G8929 Other chronic pain: Secondary | ICD-10-CM | POA: Diagnosis not present

## 2020-03-16 DIAGNOSIS — Z131 Encounter for screening for diabetes mellitus: Secondary | ICD-10-CM | POA: Diagnosis not present

## 2020-03-16 DIAGNOSIS — M0609 Rheumatoid arthritis without rheumatoid factor, multiple sites: Secondary | ICD-10-CM | POA: Diagnosis not present

## 2020-03-16 DIAGNOSIS — M4802 Spinal stenosis, cervical region: Secondary | ICD-10-CM | POA: Diagnosis not present

## 2020-03-16 DIAGNOSIS — F909 Attention-deficit hyperactivity disorder, unspecified type: Secondary | ICD-10-CM | POA: Diagnosis not present

## 2020-03-16 DIAGNOSIS — Z8679 Personal history of other diseases of the circulatory system: Secondary | ICD-10-CM | POA: Diagnosis not present

## 2020-03-16 DIAGNOSIS — R791 Abnormal coagulation profile: Secondary | ICD-10-CM | POA: Diagnosis not present

## 2020-03-16 DIAGNOSIS — Z45018 Encounter for adjustment and management of other part of cardiac pacemaker: Secondary | ICD-10-CM | POA: Diagnosis not present

## 2020-03-16 DIAGNOSIS — I6522 Occlusion and stenosis of left carotid artery: Secondary | ICD-10-CM | POA: Diagnosis not present

## 2020-03-16 DIAGNOSIS — E875 Hyperkalemia: Secondary | ICD-10-CM | POA: Diagnosis not present

## 2020-03-16 DIAGNOSIS — G35 Multiple sclerosis: Secondary | ICD-10-CM | POA: Diagnosis not present

## 2020-03-16 DIAGNOSIS — E113593 Type 2 diabetes mellitus with proliferative diabetic retinopathy without macular edema, bilateral: Secondary | ICD-10-CM | POA: Diagnosis not present

## 2020-03-16 DIAGNOSIS — J849 Interstitial pulmonary disease, unspecified: Secondary | ICD-10-CM | POA: Diagnosis not present

## 2020-03-16 DIAGNOSIS — M6281 Muscle weakness (generalized): Secondary | ICD-10-CM | POA: Diagnosis not present

## 2020-03-16 DIAGNOSIS — R062 Wheezing: Secondary | ICD-10-CM | POA: Diagnosis not present

## 2020-03-16 DIAGNOSIS — N138 Other obstructive and reflux uropathy: Secondary | ICD-10-CM | POA: Diagnosis not present

## 2020-03-16 DIAGNOSIS — Q828 Other specified congenital malformations of skin: Secondary | ICD-10-CM | POA: Diagnosis not present

## 2020-03-16 DIAGNOSIS — F3341 Major depressive disorder, recurrent, in partial remission: Secondary | ICD-10-CM | POA: Diagnosis not present

## 2020-03-16 DIAGNOSIS — L259 Unspecified contact dermatitis, unspecified cause: Secondary | ICD-10-CM | POA: Diagnosis not present

## 2020-03-16 DIAGNOSIS — Z09 Encounter for follow-up examination after completed treatment for conditions other than malignant neoplasm: Secondary | ICD-10-CM | POA: Diagnosis not present

## 2020-03-16 DIAGNOSIS — I517 Cardiomegaly: Secondary | ICD-10-CM | POA: Diagnosis not present

## 2020-03-16 DIAGNOSIS — J84112 Idiopathic pulmonary fibrosis: Secondary | ICD-10-CM | POA: Diagnosis not present

## 2020-03-16 DIAGNOSIS — Z8546 Personal history of malignant neoplasm of prostate: Secondary | ICD-10-CM | POA: Diagnosis not present

## 2020-03-16 DIAGNOSIS — L570 Actinic keratosis: Secondary | ICD-10-CM | POA: Diagnosis not present

## 2020-03-16 DIAGNOSIS — L03115 Cellulitis of right lower limb: Secondary | ICD-10-CM | POA: Diagnosis not present

## 2020-03-16 DIAGNOSIS — G451 Carotid artery syndrome (hemispheric): Secondary | ICD-10-CM | POA: Diagnosis not present

## 2020-03-16 DIAGNOSIS — J189 Pneumonia, unspecified organism: Secondary | ICD-10-CM | POA: Diagnosis not present

## 2020-03-16 DIAGNOSIS — M25579 Pain in unspecified ankle and joints of unspecified foot: Secondary | ICD-10-CM | POA: Diagnosis not present

## 2020-03-16 DIAGNOSIS — Z0001 Encounter for general adult medical examination with abnormal findings: Secondary | ICD-10-CM | POA: Diagnosis not present

## 2020-03-16 DIAGNOSIS — S83281A Other tear of lateral meniscus, current injury, right knee, initial encounter: Secondary | ICD-10-CM | POA: Diagnosis not present

## 2020-03-16 DIAGNOSIS — D473 Essential (hemorrhagic) thrombocythemia: Secondary | ICD-10-CM | POA: Diagnosis not present

## 2020-03-16 DIAGNOSIS — H2511 Age-related nuclear cataract, right eye: Secondary | ICD-10-CM | POA: Diagnosis not present

## 2020-03-16 DIAGNOSIS — H11823 Conjunctivochalasis, bilateral: Secondary | ICD-10-CM | POA: Diagnosis not present

## 2020-03-16 DIAGNOSIS — M25612 Stiffness of left shoulder, not elsewhere classified: Secondary | ICD-10-CM | POA: Diagnosis not present

## 2020-03-16 DIAGNOSIS — R0602 Shortness of breath: Secondary | ICD-10-CM | POA: Diagnosis not present

## 2020-03-16 DIAGNOSIS — M7730 Calcaneal spur, unspecified foot: Secondary | ICD-10-CM | POA: Diagnosis not present

## 2020-03-16 DIAGNOSIS — H26493 Other secondary cataract, bilateral: Secondary | ICD-10-CM | POA: Diagnosis not present

## 2020-03-16 DIAGNOSIS — Z6831 Body mass index (BMI) 31.0-31.9, adult: Secondary | ICD-10-CM | POA: Diagnosis not present

## 2020-03-16 DIAGNOSIS — H04123 Dry eye syndrome of bilateral lacrimal glands: Secondary | ICD-10-CM | POA: Diagnosis not present

## 2020-03-16 DIAGNOSIS — C9110 Chronic lymphocytic leukemia of B-cell type not having achieved remission: Secondary | ICD-10-CM | POA: Diagnosis not present

## 2020-03-16 DIAGNOSIS — R1013 Epigastric pain: Secondary | ICD-10-CM | POA: Diagnosis not present

## 2020-03-16 DIAGNOSIS — R42 Dizziness and giddiness: Secondary | ICD-10-CM | POA: Diagnosis not present

## 2020-03-16 DIAGNOSIS — Z1331 Encounter for screening for depression: Secondary | ICD-10-CM | POA: Diagnosis not present

## 2020-03-16 DIAGNOSIS — M7752 Other enthesopathy of left foot: Secondary | ICD-10-CM | POA: Diagnosis not present

## 2020-03-16 DIAGNOSIS — R5381 Other malaise: Secondary | ICD-10-CM | POA: Diagnosis not present

## 2020-03-16 DIAGNOSIS — R809 Proteinuria, unspecified: Secondary | ICD-10-CM | POA: Diagnosis not present

## 2020-03-16 DIAGNOSIS — M3210 Systemic lupus erythematosus, organ or system involvement unspecified: Secondary | ICD-10-CM | POA: Diagnosis not present

## 2020-03-16 DIAGNOSIS — M94261 Chondromalacia, right knee: Secondary | ICD-10-CM | POA: Diagnosis not present

## 2020-03-16 DIAGNOSIS — Z471 Aftercare following joint replacement surgery: Secondary | ICD-10-CM | POA: Diagnosis not present

## 2020-03-16 DIAGNOSIS — M79641 Pain in right hand: Secondary | ICD-10-CM | POA: Diagnosis not present

## 2020-03-16 DIAGNOSIS — L720 Epidermal cyst: Secondary | ICD-10-CM | POA: Diagnosis not present

## 2020-03-16 DIAGNOSIS — I340 Nonrheumatic mitral (valve) insufficiency: Secondary | ICD-10-CM | POA: Diagnosis not present

## 2020-03-16 DIAGNOSIS — E08621 Diabetes mellitus due to underlying condition with foot ulcer: Secondary | ICD-10-CM | POA: Diagnosis not present

## 2020-03-16 DIAGNOSIS — N3281 Overactive bladder: Secondary | ICD-10-CM | POA: Diagnosis not present

## 2020-03-16 DIAGNOSIS — M79675 Pain in left toe(s): Secondary | ICD-10-CM | POA: Diagnosis not present

## 2020-03-16 DIAGNOSIS — N179 Acute kidney failure, unspecified: Secondary | ICD-10-CM | POA: Diagnosis not present

## 2020-03-16 DIAGNOSIS — S99921A Unspecified injury of right foot, initial encounter: Secondary | ICD-10-CM | POA: Diagnosis not present

## 2020-03-16 DIAGNOSIS — Z6825 Body mass index (BMI) 25.0-25.9, adult: Secondary | ICD-10-CM | POA: Diagnosis not present

## 2020-03-16 DIAGNOSIS — Z951 Presence of aortocoronary bypass graft: Secondary | ICD-10-CM | POA: Diagnosis not present

## 2020-03-16 DIAGNOSIS — E291 Testicular hypofunction: Secondary | ICD-10-CM | POA: Diagnosis not present

## 2020-03-16 DIAGNOSIS — H43813 Vitreous degeneration, bilateral: Secondary | ICD-10-CM | POA: Diagnosis not present

## 2020-03-16 DIAGNOSIS — Z6834 Body mass index (BMI) 34.0-34.9, adult: Secondary | ICD-10-CM | POA: Diagnosis not present

## 2020-03-16 DIAGNOSIS — R413 Other amnesia: Secondary | ICD-10-CM | POA: Diagnosis not present

## 2020-03-16 DIAGNOSIS — M779 Enthesopathy, unspecified: Secondary | ICD-10-CM | POA: Diagnosis not present

## 2020-03-16 DIAGNOSIS — G893 Neoplasm related pain (acute) (chronic): Secondary | ICD-10-CM | POA: Diagnosis not present

## 2020-03-16 DIAGNOSIS — J340 Abscess, furuncle and carbuncle of nose: Secondary | ICD-10-CM | POA: Diagnosis not present

## 2020-03-16 DIAGNOSIS — I440 Atrioventricular block, first degree: Secondary | ICD-10-CM | POA: Diagnosis not present

## 2020-03-16 DIAGNOSIS — Z1212 Encounter for screening for malignant neoplasm of rectum: Secondary | ICD-10-CM | POA: Diagnosis not present

## 2020-03-16 DIAGNOSIS — L821 Other seborrheic keratosis: Secondary | ICD-10-CM | POA: Diagnosis not present

## 2020-03-16 DIAGNOSIS — F4321 Adjustment disorder with depressed mood: Secondary | ICD-10-CM | POA: Diagnosis not present

## 2020-03-16 DIAGNOSIS — F1120 Opioid dependence, uncomplicated: Secondary | ICD-10-CM | POA: Diagnosis not present

## 2020-03-16 DIAGNOSIS — I8312 Varicose veins of left lower extremity with inflammation: Secondary | ICD-10-CM | POA: Diagnosis not present

## 2020-03-16 DIAGNOSIS — E782 Mixed hyperlipidemia: Secondary | ICD-10-CM | POA: Diagnosis not present

## 2020-03-16 DIAGNOSIS — I350 Nonrheumatic aortic (valve) stenosis: Secondary | ICD-10-CM | POA: Diagnosis not present

## 2020-03-16 DIAGNOSIS — M25562 Pain in left knee: Secondary | ICD-10-CM | POA: Diagnosis not present

## 2020-03-16 DIAGNOSIS — Z6827 Body mass index (BMI) 27.0-27.9, adult: Secondary | ICD-10-CM | POA: Diagnosis not present

## 2020-03-16 DIAGNOSIS — J301 Allergic rhinitis due to pollen: Secondary | ICD-10-CM | POA: Diagnosis not present

## 2020-03-16 DIAGNOSIS — C801 Malignant (primary) neoplasm, unspecified: Secondary | ICD-10-CM | POA: Diagnosis not present

## 2020-03-16 DIAGNOSIS — I2510 Atherosclerotic heart disease of native coronary artery without angina pectoris: Secondary | ICD-10-CM | POA: Diagnosis not present

## 2020-03-16 DIAGNOSIS — Z79899 Other long term (current) drug therapy: Secondary | ICD-10-CM | POA: Diagnosis not present

## 2020-03-16 DIAGNOSIS — E8770 Fluid overload, unspecified: Secondary | ICD-10-CM | POA: Diagnosis not present

## 2020-03-16 DIAGNOSIS — H6522 Chronic serous otitis media, left ear: Secondary | ICD-10-CM | POA: Diagnosis not present

## 2020-03-16 DIAGNOSIS — S98132A Complete traumatic amputation of one left lesser toe, initial encounter: Secondary | ICD-10-CM | POA: Diagnosis not present

## 2020-03-16 DIAGNOSIS — S46001A Unspecified injury of muscle(s) and tendon(s) of the rotator cuff of right shoulder, initial encounter: Secondary | ICD-10-CM | POA: Diagnosis not present

## 2020-03-16 DIAGNOSIS — C672 Malignant neoplasm of lateral wall of bladder: Secondary | ICD-10-CM | POA: Diagnosis not present

## 2020-03-16 DIAGNOSIS — M1612 Unilateral primary osteoarthritis, left hip: Secondary | ICD-10-CM | POA: Diagnosis not present

## 2020-03-16 DIAGNOSIS — K429 Umbilical hernia without obstruction or gangrene: Secondary | ICD-10-CM | POA: Diagnosis not present

## 2020-03-16 DIAGNOSIS — S80829A Blister (nonthermal), unspecified lower leg, initial encounter: Secondary | ICD-10-CM | POA: Diagnosis not present

## 2020-03-16 DIAGNOSIS — I82531 Chronic embolism and thrombosis of right popliteal vein: Secondary | ICD-10-CM | POA: Diagnosis not present

## 2020-03-16 DIAGNOSIS — Z5189 Encounter for other specified aftercare: Secondary | ICD-10-CM | POA: Diagnosis not present

## 2020-03-16 DIAGNOSIS — M5136 Other intervertebral disc degeneration, lumbar region: Secondary | ICD-10-CM | POA: Diagnosis not present

## 2020-03-16 DIAGNOSIS — E1129 Type 2 diabetes mellitus with other diabetic kidney complication: Secondary | ICD-10-CM | POA: Diagnosis not present

## 2020-03-16 DIAGNOSIS — M7732 Calcaneal spur, left foot: Secondary | ICD-10-CM | POA: Diagnosis not present

## 2020-03-16 DIAGNOSIS — L659 Nonscarring hair loss, unspecified: Secondary | ICD-10-CM | POA: Diagnosis not present

## 2020-03-16 DIAGNOSIS — M25661 Stiffness of right knee, not elsewhere classified: Secondary | ICD-10-CM | POA: Diagnosis not present

## 2020-03-16 DIAGNOSIS — Z950 Presence of cardiac pacemaker: Secondary | ICD-10-CM | POA: Diagnosis not present

## 2020-03-16 DIAGNOSIS — R293 Abnormal posture: Secondary | ICD-10-CM | POA: Diagnosis not present

## 2020-03-16 DIAGNOSIS — I4901 Ventricular fibrillation: Secondary | ICD-10-CM | POA: Diagnosis not present

## 2020-03-16 DIAGNOSIS — F17200 Nicotine dependence, unspecified, uncomplicated: Secondary | ICD-10-CM | POA: Diagnosis not present

## 2020-03-16 DIAGNOSIS — R7302 Impaired glucose tolerance (oral): Secondary | ICD-10-CM | POA: Diagnosis not present

## 2020-03-16 DIAGNOSIS — M25551 Pain in right hip: Secondary | ICD-10-CM | POA: Diagnosis not present

## 2020-03-16 DIAGNOSIS — M25662 Stiffness of left knee, not elsewhere classified: Secondary | ICD-10-CM | POA: Diagnosis not present

## 2020-03-16 DIAGNOSIS — G309 Alzheimer's disease, unspecified: Secondary | ICD-10-CM | POA: Diagnosis not present

## 2020-03-16 DIAGNOSIS — R1031 Right lower quadrant pain: Secondary | ICD-10-CM | POA: Diagnosis not present

## 2020-03-16 DIAGNOSIS — N952 Postmenopausal atrophic vaginitis: Secondary | ICD-10-CM | POA: Diagnosis not present

## 2020-03-16 DIAGNOSIS — R030 Elevated blood-pressure reading, without diagnosis of hypertension: Secondary | ICD-10-CM | POA: Diagnosis not present

## 2020-03-16 DIAGNOSIS — Y999 Unspecified external cause status: Secondary | ICD-10-CM | POA: Diagnosis not present

## 2020-03-16 DIAGNOSIS — H547 Unspecified visual loss: Secondary | ICD-10-CM | POA: Diagnosis not present

## 2020-03-16 DIAGNOSIS — L89152 Pressure ulcer of sacral region, stage 2: Secondary | ICD-10-CM | POA: Diagnosis not present

## 2020-03-16 DIAGNOSIS — I743 Embolism and thrombosis of arteries of the lower extremities: Secondary | ICD-10-CM | POA: Diagnosis not present

## 2020-03-16 DIAGNOSIS — Z83511 Family history of glaucoma: Secondary | ICD-10-CM | POA: Diagnosis not present

## 2020-03-16 DIAGNOSIS — D3A010 Benign carcinoid tumor of the duodenum: Secondary | ICD-10-CM | POA: Diagnosis not present

## 2020-03-16 DIAGNOSIS — E871 Hypo-osmolality and hyponatremia: Secondary | ICD-10-CM | POA: Diagnosis not present

## 2020-03-16 DIAGNOSIS — E1121 Type 2 diabetes mellitus with diabetic nephropathy: Secondary | ICD-10-CM | POA: Diagnosis not present

## 2020-03-16 DIAGNOSIS — T7840XD Allergy, unspecified, subsequent encounter: Secondary | ICD-10-CM | POA: Diagnosis not present

## 2020-03-16 DIAGNOSIS — S62011A Displaced fracture of distal pole of navicular [scaphoid] bone of right wrist, initial encounter for closed fracture: Secondary | ICD-10-CM | POA: Diagnosis not present

## 2020-03-16 DIAGNOSIS — Z20828 Contact with and (suspected) exposure to other viral communicable diseases: Secondary | ICD-10-CM | POA: Diagnosis not present

## 2020-03-16 DIAGNOSIS — R262 Difficulty in walking, not elsewhere classified: Secondary | ICD-10-CM | POA: Diagnosis not present

## 2020-03-16 DIAGNOSIS — Z952 Presence of prosthetic heart valve: Secondary | ICD-10-CM | POA: Diagnosis not present

## 2020-03-16 DIAGNOSIS — F0150 Vascular dementia without behavioral disturbance: Secondary | ICD-10-CM | POA: Diagnosis not present

## 2020-03-16 DIAGNOSIS — M25511 Pain in right shoulder: Secondary | ICD-10-CM | POA: Diagnosis not present

## 2020-03-16 DIAGNOSIS — M06032 Rheumatoid arthritis without rheumatoid factor, left wrist: Secondary | ICD-10-CM | POA: Diagnosis not present

## 2020-03-16 DIAGNOSIS — J45909 Unspecified asthma, uncomplicated: Secondary | ICD-10-CM | POA: Diagnosis not present

## 2020-03-16 DIAGNOSIS — F330 Major depressive disorder, recurrent, mild: Secondary | ICD-10-CM | POA: Diagnosis not present

## 2020-03-16 DIAGNOSIS — N1831 Chronic kidney disease, stage 3a: Secondary | ICD-10-CM | POA: Diagnosis not present

## 2020-03-16 DIAGNOSIS — C678 Malignant neoplasm of overlapping sites of bladder: Secondary | ICD-10-CM | POA: Diagnosis not present

## 2020-03-16 DIAGNOSIS — Z9359 Other cystostomy status: Secondary | ICD-10-CM | POA: Diagnosis not present

## 2020-03-16 DIAGNOSIS — N430 Encysted hydrocele: Secondary | ICD-10-CM | POA: Diagnosis not present

## 2020-03-16 DIAGNOSIS — I152 Hypertension secondary to endocrine disorders: Secondary | ICD-10-CM | POA: Diagnosis not present

## 2020-03-16 DIAGNOSIS — R1030 Lower abdominal pain, unspecified: Secondary | ICD-10-CM | POA: Diagnosis not present

## 2020-03-16 DIAGNOSIS — Z789 Other specified health status: Secondary | ICD-10-CM | POA: Diagnosis not present

## 2020-03-16 DIAGNOSIS — M79674 Pain in right toe(s): Secondary | ICD-10-CM | POA: Diagnosis not present

## 2020-03-16 DIAGNOSIS — R931 Abnormal findings on diagnostic imaging of heart and coronary circulation: Secondary | ICD-10-CM | POA: Diagnosis not present

## 2020-03-16 DIAGNOSIS — M79605 Pain in left leg: Secondary | ICD-10-CM | POA: Diagnosis not present

## 2020-03-16 DIAGNOSIS — H1045 Other chronic allergic conjunctivitis: Secondary | ICD-10-CM | POA: Diagnosis not present

## 2020-03-16 DIAGNOSIS — H44113 Panuveitis, bilateral: Secondary | ICD-10-CM | POA: Diagnosis not present

## 2020-03-16 DIAGNOSIS — I951 Orthostatic hypotension: Secondary | ICD-10-CM | POA: Diagnosis not present

## 2020-03-16 DIAGNOSIS — R609 Edema, unspecified: Secondary | ICD-10-CM | POA: Diagnosis not present

## 2020-03-16 DIAGNOSIS — J9602 Acute respiratory failure with hypercapnia: Secondary | ICD-10-CM | POA: Diagnosis not present

## 2020-03-16 DIAGNOSIS — H4322 Crystalline deposits in vitreous body, left eye: Secondary | ICD-10-CM | POA: Diagnosis not present

## 2020-03-16 DIAGNOSIS — R55 Syncope and collapse: Secondary | ICD-10-CM | POA: Diagnosis not present

## 2020-03-16 DIAGNOSIS — I723 Aneurysm of iliac artery: Secondary | ICD-10-CM | POA: Diagnosis not present

## 2020-03-16 DIAGNOSIS — H903 Sensorineural hearing loss, bilateral: Secondary | ICD-10-CM | POA: Diagnosis not present

## 2020-03-16 DIAGNOSIS — M797 Fibromyalgia: Secondary | ICD-10-CM | POA: Diagnosis not present

## 2020-03-16 DIAGNOSIS — K7689 Other specified diseases of liver: Secondary | ICD-10-CM | POA: Diagnosis not present

## 2020-03-16 DIAGNOSIS — H209 Unspecified iridocyclitis: Secondary | ICD-10-CM | POA: Diagnosis not present

## 2020-03-16 DIAGNOSIS — Z130 Encounter for screening for diseases of the blood and blood-forming organs and certain disorders involving the immune mechanism: Secondary | ICD-10-CM | POA: Diagnosis not present

## 2020-03-16 DIAGNOSIS — N130 Hydronephrosis with ureteropelvic junction obstruction: Secondary | ICD-10-CM | POA: Diagnosis not present

## 2020-03-16 DIAGNOSIS — R972 Elevated prostate specific antigen [PSA]: Secondary | ICD-10-CM | POA: Diagnosis not present

## 2020-03-16 DIAGNOSIS — J4550 Severe persistent asthma, uncomplicated: Secondary | ICD-10-CM | POA: Diagnosis not present

## 2020-03-16 DIAGNOSIS — M170 Bilateral primary osteoarthritis of knee: Secondary | ICD-10-CM | POA: Diagnosis not present

## 2020-03-16 DIAGNOSIS — D649 Anemia, unspecified: Secondary | ICD-10-CM | POA: Diagnosis not present

## 2020-03-16 DIAGNOSIS — G5622 Lesion of ulnar nerve, left upper limb: Secondary | ICD-10-CM | POA: Diagnosis not present

## 2020-03-16 DIAGNOSIS — L814 Other melanin hyperpigmentation: Secondary | ICD-10-CM | POA: Diagnosis not present

## 2020-03-16 DIAGNOSIS — E790 Hyperuricemia without signs of inflammatory arthritis and tophaceous disease: Secondary | ICD-10-CM | POA: Diagnosis not present

## 2020-03-16 DIAGNOSIS — M1990 Unspecified osteoarthritis, unspecified site: Secondary | ICD-10-CM | POA: Diagnosis not present

## 2020-03-16 DIAGNOSIS — D692 Other nonthrombocytopenic purpura: Secondary | ICD-10-CM | POA: Diagnosis not present

## 2020-03-16 DIAGNOSIS — K6389 Other specified diseases of intestine: Secondary | ICD-10-CM | POA: Diagnosis not present

## 2020-03-16 DIAGNOSIS — S62614S Displaced fracture of proximal phalanx of right ring finger, sequela: Secondary | ICD-10-CM | POA: Diagnosis not present

## 2020-03-16 DIAGNOSIS — R29898 Other symptoms and signs involving the musculoskeletal system: Secondary | ICD-10-CM | POA: Diagnosis not present

## 2020-03-16 DIAGNOSIS — Z1231 Encounter for screening mammogram for malignant neoplasm of breast: Secondary | ICD-10-CM | POA: Diagnosis not present

## 2020-03-16 DIAGNOSIS — M7542 Impingement syndrome of left shoulder: Secondary | ICD-10-CM | POA: Diagnosis not present

## 2020-03-16 DIAGNOSIS — S72002D Fracture of unspecified part of neck of left femur, subsequent encounter for closed fracture with routine healing: Secondary | ICD-10-CM | POA: Diagnosis not present

## 2020-03-16 DIAGNOSIS — Z86711 Personal history of pulmonary embolism: Secondary | ICD-10-CM | POA: Diagnosis not present

## 2020-03-16 DIAGNOSIS — R7301 Impaired fasting glucose: Secondary | ICD-10-CM | POA: Diagnosis not present

## 2020-03-16 DIAGNOSIS — U071 COVID-19: Secondary | ICD-10-CM | POA: Diagnosis not present

## 2020-03-16 DIAGNOSIS — G20 Parkinson's disease: Secondary | ICD-10-CM | POA: Diagnosis not present

## 2020-03-16 DIAGNOSIS — M19072 Primary osteoarthritis, left ankle and foot: Secondary | ICD-10-CM | POA: Diagnosis not present

## 2020-03-16 DIAGNOSIS — G629 Polyneuropathy, unspecified: Secondary | ICD-10-CM | POA: Diagnosis not present

## 2020-03-16 DIAGNOSIS — Q762 Congenital spondylolisthesis: Secondary | ICD-10-CM | POA: Diagnosis not present

## 2020-03-16 DIAGNOSIS — T63441D Toxic effect of venom of bees, accidental (unintentional), subsequent encounter: Secondary | ICD-10-CM | POA: Diagnosis not present

## 2020-03-16 DIAGNOSIS — M5442 Lumbago with sciatica, left side: Secondary | ICD-10-CM | POA: Diagnosis not present

## 2020-03-16 DIAGNOSIS — R933 Abnormal findings on diagnostic imaging of other parts of digestive tract: Secondary | ICD-10-CM | POA: Diagnosis not present

## 2020-03-16 DIAGNOSIS — J9621 Acute and chronic respiratory failure with hypoxia: Secondary | ICD-10-CM | POA: Diagnosis not present

## 2020-03-16 DIAGNOSIS — H10023 Other mucopurulent conjunctivitis, bilateral: Secondary | ICD-10-CM | POA: Diagnosis not present

## 2020-03-16 DIAGNOSIS — J3089 Other allergic rhinitis: Secondary | ICD-10-CM | POA: Diagnosis not present

## 2020-03-16 DIAGNOSIS — M47894 Other spondylosis, thoracic region: Secondary | ICD-10-CM | POA: Diagnosis not present

## 2020-03-16 DIAGNOSIS — I442 Atrioventricular block, complete: Secondary | ICD-10-CM | POA: Diagnosis not present

## 2020-03-16 DIAGNOSIS — M778 Other enthesopathies, not elsewhere classified: Secondary | ICD-10-CM | POA: Diagnosis not present

## 2020-03-16 DIAGNOSIS — M79604 Pain in right leg: Secondary | ICD-10-CM | POA: Diagnosis not present

## 2020-03-16 DIAGNOSIS — J309 Allergic rhinitis, unspecified: Secondary | ICD-10-CM | POA: Diagnosis not present

## 2020-03-16 DIAGNOSIS — I671 Cerebral aneurysm, nonruptured: Secondary | ICD-10-CM | POA: Diagnosis not present

## 2020-03-16 DIAGNOSIS — R21 Rash and other nonspecific skin eruption: Secondary | ICD-10-CM | POA: Diagnosis not present

## 2020-03-16 DIAGNOSIS — H25813 Combined forms of age-related cataract, bilateral: Secondary | ICD-10-CM | POA: Diagnosis not present

## 2020-03-16 DIAGNOSIS — G9340 Encephalopathy, unspecified: Secondary | ICD-10-CM | POA: Diagnosis not present

## 2020-03-16 DIAGNOSIS — M25571 Pain in right ankle and joints of right foot: Secondary | ICD-10-CM | POA: Diagnosis not present

## 2020-03-16 DIAGNOSIS — H04411 Chronic dacryocystitis of right lacrimal passage: Secondary | ICD-10-CM | POA: Diagnosis not present

## 2020-03-16 DIAGNOSIS — R05 Cough: Secondary | ICD-10-CM | POA: Diagnosis not present

## 2020-03-16 DIAGNOSIS — H25042 Posterior subcapsular polar age-related cataract, left eye: Secondary | ICD-10-CM | POA: Diagnosis not present

## 2020-03-16 DIAGNOSIS — J439 Emphysema, unspecified: Secondary | ICD-10-CM | POA: Diagnosis not present

## 2020-03-16 DIAGNOSIS — D72829 Elevated white blood cell count, unspecified: Secondary | ICD-10-CM | POA: Diagnosis not present

## 2020-03-16 DIAGNOSIS — M859 Disorder of bone density and structure, unspecified: Secondary | ICD-10-CM | POA: Diagnosis not present

## 2020-03-16 DIAGNOSIS — H353211 Exudative age-related macular degeneration, right eye, with active choroidal neovascularization: Secondary | ICD-10-CM | POA: Diagnosis not present

## 2020-03-16 DIAGNOSIS — R202 Paresthesia of skin: Secondary | ICD-10-CM | POA: Diagnosis not present

## 2020-03-16 DIAGNOSIS — G4700 Insomnia, unspecified: Secondary | ICD-10-CM | POA: Diagnosis not present

## 2020-03-16 DIAGNOSIS — G501 Atypical facial pain: Secondary | ICD-10-CM | POA: Diagnosis not present

## 2020-03-16 DIAGNOSIS — Z87898 Personal history of other specified conditions: Secondary | ICD-10-CM | POA: Diagnosis not present

## 2020-03-16 DIAGNOSIS — C3411 Malignant neoplasm of upper lobe, right bronchus or lung: Secondary | ICD-10-CM | POA: Diagnosis not present

## 2020-03-16 DIAGNOSIS — F3342 Major depressive disorder, recurrent, in full remission: Secondary | ICD-10-CM | POA: Diagnosis not present

## 2020-03-16 DIAGNOSIS — C3492 Malignant neoplasm of unspecified part of left bronchus or lung: Secondary | ICD-10-CM | POA: Diagnosis not present

## 2020-03-16 DIAGNOSIS — E113313 Type 2 diabetes mellitus with moderate nonproliferative diabetic retinopathy with macular edema, bilateral: Secondary | ICD-10-CM | POA: Diagnosis not present

## 2020-03-16 DIAGNOSIS — M5126 Other intervertebral disc displacement, lumbar region: Secondary | ICD-10-CM | POA: Diagnosis not present

## 2020-03-16 DIAGNOSIS — G500 Trigeminal neuralgia: Secondary | ICD-10-CM | POA: Diagnosis not present

## 2020-03-16 DIAGNOSIS — F5101 Primary insomnia: Secondary | ICD-10-CM | POA: Diagnosis not present

## 2020-03-16 DIAGNOSIS — E11610 Type 2 diabetes mellitus with diabetic neuropathic arthropathy: Secondary | ICD-10-CM | POA: Diagnosis not present

## 2020-03-16 DIAGNOSIS — M79652 Pain in left thigh: Secondary | ICD-10-CM | POA: Diagnosis not present

## 2020-03-16 DIAGNOSIS — F0280 Dementia in other diseases classified elsewhere without behavioral disturbance: Secondary | ICD-10-CM | POA: Diagnosis not present

## 2020-03-16 DIAGNOSIS — S0001XA Abrasion of scalp, initial encounter: Secondary | ICD-10-CM | POA: Diagnosis not present

## 2020-03-16 DIAGNOSIS — N390 Urinary tract infection, site not specified: Secondary | ICD-10-CM | POA: Diagnosis not present

## 2020-03-16 DIAGNOSIS — R2689 Other abnormalities of gait and mobility: Secondary | ICD-10-CM | POA: Diagnosis not present

## 2020-03-16 DIAGNOSIS — G478 Other sleep disorders: Secondary | ICD-10-CM | POA: Diagnosis not present

## 2020-03-16 DIAGNOSIS — J690 Pneumonitis due to inhalation of food and vomit: Secondary | ICD-10-CM | POA: Diagnosis not present

## 2020-03-16 DIAGNOSIS — L0390 Cellulitis, unspecified: Secondary | ICD-10-CM | POA: Diagnosis not present

## 2020-03-16 DIAGNOSIS — I712 Thoracic aortic aneurysm, without rupture: Secondary | ICD-10-CM | POA: Diagnosis not present

## 2020-03-16 DIAGNOSIS — M84375A Stress fracture, left foot, initial encounter for fracture: Secondary | ICD-10-CM | POA: Diagnosis not present

## 2020-03-16 DIAGNOSIS — E860 Dehydration: Secondary | ICD-10-CM | POA: Diagnosis not present

## 2020-03-16 DIAGNOSIS — F3289 Other specified depressive episodes: Secondary | ICD-10-CM | POA: Diagnosis not present

## 2020-03-16 DIAGNOSIS — K760 Fatty (change of) liver, not elsewhere classified: Secondary | ICD-10-CM | POA: Diagnosis not present

## 2020-03-16 DIAGNOSIS — M9905 Segmental and somatic dysfunction of pelvic region: Secondary | ICD-10-CM | POA: Diagnosis not present

## 2020-03-16 DIAGNOSIS — H02834 Dermatochalasis of left upper eyelid: Secondary | ICD-10-CM | POA: Diagnosis not present

## 2020-03-16 DIAGNOSIS — R945 Abnormal results of liver function studies: Secondary | ICD-10-CM | POA: Diagnosis not present

## 2020-03-16 DIAGNOSIS — H35373 Puckering of macula, bilateral: Secondary | ICD-10-CM | POA: Diagnosis not present

## 2020-03-16 DIAGNOSIS — E041 Nontoxic single thyroid nodule: Secondary | ICD-10-CM | POA: Diagnosis not present

## 2020-03-16 DIAGNOSIS — S32020A Wedge compression fracture of second lumbar vertebra, initial encounter for closed fracture: Secondary | ICD-10-CM | POA: Diagnosis not present

## 2020-03-16 DIAGNOSIS — R221 Localized swelling, mass and lump, neck: Secondary | ICD-10-CM | POA: Diagnosis not present

## 2020-03-16 DIAGNOSIS — H353133 Nonexudative age-related macular degeneration, bilateral, advanced atrophic without subfoveal involvement: Secondary | ICD-10-CM | POA: Diagnosis not present

## 2020-03-16 DIAGNOSIS — D3132 Benign neoplasm of left choroid: Secondary | ICD-10-CM | POA: Diagnosis not present

## 2020-03-16 DIAGNOSIS — M1711 Unilateral primary osteoarthritis, right knee: Secondary | ICD-10-CM | POA: Diagnosis not present

## 2020-03-16 DIAGNOSIS — E211 Secondary hyperparathyroidism, not elsewhere classified: Secondary | ICD-10-CM | POA: Diagnosis not present

## 2020-03-16 DIAGNOSIS — Z96642 Presence of left artificial hip joint: Secondary | ICD-10-CM | POA: Diagnosis not present

## 2020-03-16 DIAGNOSIS — M4015 Other secondary kyphosis, thoracolumbar region: Secondary | ICD-10-CM | POA: Diagnosis not present

## 2020-03-16 DIAGNOSIS — H5211 Myopia, right eye: Secondary | ICD-10-CM | POA: Diagnosis not present

## 2020-03-16 DIAGNOSIS — Z5112 Encounter for antineoplastic immunotherapy: Secondary | ICD-10-CM | POA: Diagnosis not present

## 2020-03-16 DIAGNOSIS — H9113 Presbycusis, bilateral: Secondary | ICD-10-CM | POA: Diagnosis not present

## 2020-03-16 DIAGNOSIS — K224 Dyskinesia of esophagus: Secondary | ICD-10-CM | POA: Diagnosis not present

## 2020-03-16 DIAGNOSIS — M546 Pain in thoracic spine: Secondary | ICD-10-CM | POA: Diagnosis not present

## 2020-03-16 DIAGNOSIS — M3501 Sicca syndrome with keratoconjunctivitis: Secondary | ICD-10-CM | POA: Diagnosis not present

## 2020-03-16 DIAGNOSIS — D72810 Lymphocytopenia: Secondary | ICD-10-CM | POA: Diagnosis not present

## 2020-03-16 DIAGNOSIS — M2550 Pain in unspecified joint: Secondary | ICD-10-CM | POA: Diagnosis not present

## 2020-03-16 DIAGNOSIS — E538 Deficiency of other specified B group vitamins: Secondary | ICD-10-CM | POA: Diagnosis not present

## 2020-03-16 DIAGNOSIS — I509 Heart failure, unspecified: Secondary | ICD-10-CM | POA: Diagnosis not present

## 2020-03-16 DIAGNOSIS — H26491 Other secondary cataract, right eye: Secondary | ICD-10-CM | POA: Diagnosis not present

## 2020-03-16 DIAGNOSIS — K922 Gastrointestinal hemorrhage, unspecified: Secondary | ICD-10-CM | POA: Diagnosis not present

## 2020-03-16 DIAGNOSIS — M419 Scoliosis, unspecified: Secondary | ICD-10-CM | POA: Diagnosis not present

## 2020-03-16 DIAGNOSIS — L400 Psoriasis vulgaris: Secondary | ICD-10-CM | POA: Diagnosis not present

## 2020-03-16 DIAGNOSIS — S93601A Unspecified sprain of right foot, initial encounter: Secondary | ICD-10-CM | POA: Diagnosis not present

## 2020-03-16 DIAGNOSIS — Z13220 Encounter for screening for lipoid disorders: Secondary | ICD-10-CM | POA: Diagnosis not present

## 2020-03-16 DIAGNOSIS — Z79891 Long term (current) use of opiate analgesic: Secondary | ICD-10-CM | POA: Diagnosis not present

## 2020-03-16 DIAGNOSIS — N323 Diverticulum of bladder: Secondary | ICD-10-CM | POA: Diagnosis not present

## 2020-03-16 DIAGNOSIS — E042 Nontoxic multinodular goiter: Secondary | ICD-10-CM | POA: Diagnosis not present

## 2020-03-16 DIAGNOSIS — Z1389 Encounter for screening for other disorder: Secondary | ICD-10-CM | POA: Diagnosis not present

## 2020-03-16 DIAGNOSIS — R269 Unspecified abnormalities of gait and mobility: Secondary | ICD-10-CM | POA: Diagnosis not present

## 2020-03-16 DIAGNOSIS — Z85118 Personal history of other malignant neoplasm of bronchus and lung: Secondary | ICD-10-CM | POA: Diagnosis not present

## 2020-03-16 DIAGNOSIS — D691 Qualitative platelet defects: Secondary | ICD-10-CM | POA: Diagnosis not present

## 2020-03-16 DIAGNOSIS — Z832 Family history of diseases of the blood and blood-forming organs and certain disorders involving the immune mechanism: Secondary | ICD-10-CM | POA: Diagnosis not present

## 2020-03-16 DIAGNOSIS — I779 Disorder of arteries and arterioles, unspecified: Secondary | ICD-10-CM | POA: Diagnosis not present

## 2020-03-16 DIAGNOSIS — G44319 Acute post-traumatic headache, not intractable: Secondary | ICD-10-CM | POA: Diagnosis not present

## 2020-03-16 DIAGNOSIS — E2740 Unspecified adrenocortical insufficiency: Secondary | ICD-10-CM | POA: Diagnosis not present

## 2020-03-16 DIAGNOSIS — R3121 Asymptomatic microscopic hematuria: Secondary | ICD-10-CM | POA: Diagnosis not present

## 2020-03-16 DIAGNOSIS — H26492 Other secondary cataract, left eye: Secondary | ICD-10-CM | POA: Diagnosis not present

## 2020-03-16 DIAGNOSIS — S99191K Other physeal fracture of right metatarsal, subsequent encounter for fracture with nonunion: Secondary | ICD-10-CM | POA: Diagnosis not present

## 2020-03-16 DIAGNOSIS — F321 Major depressive disorder, single episode, moderate: Secondary | ICD-10-CM | POA: Diagnosis not present

## 2020-03-16 DIAGNOSIS — Z7982 Long term (current) use of aspirin: Secondary | ICD-10-CM | POA: Diagnosis not present

## 2020-03-16 DIAGNOSIS — Z9889 Other specified postprocedural states: Secondary | ICD-10-CM | POA: Diagnosis not present

## 2020-03-16 DIAGNOSIS — I25119 Atherosclerotic heart disease of native coronary artery with unspecified angina pectoris: Secondary | ICD-10-CM | POA: Diagnosis not present

## 2020-03-16 DIAGNOSIS — N393 Stress incontinence (female) (male): Secondary | ICD-10-CM | POA: Diagnosis not present

## 2020-03-16 DIAGNOSIS — M19071 Primary osteoarthritis, right ankle and foot: Secondary | ICD-10-CM | POA: Diagnosis not present

## 2020-03-16 DIAGNOSIS — Z85038 Personal history of other malignant neoplasm of large intestine: Secondary | ICD-10-CM | POA: Diagnosis not present

## 2020-03-16 DIAGNOSIS — Z5181 Encounter for therapeutic drug level monitoring: Secondary | ICD-10-CM | POA: Diagnosis not present

## 2020-03-16 DIAGNOSIS — J4520 Mild intermittent asthma, uncomplicated: Secondary | ICD-10-CM | POA: Diagnosis not present

## 2020-03-16 DIAGNOSIS — L438 Other lichen planus: Secondary | ICD-10-CM | POA: Diagnosis not present

## 2020-03-16 DIAGNOSIS — M65332 Trigger finger, left middle finger: Secondary | ICD-10-CM | POA: Diagnosis not present

## 2020-03-16 DIAGNOSIS — R159 Full incontinence of feces: Secondary | ICD-10-CM | POA: Diagnosis not present

## 2020-03-16 DIAGNOSIS — L249 Irritant contact dermatitis, unspecified cause: Secondary | ICD-10-CM | POA: Diagnosis not present

## 2020-03-16 DIAGNOSIS — R0902 Hypoxemia: Secondary | ICD-10-CM | POA: Diagnosis not present

## 2020-03-16 DIAGNOSIS — M47896 Other spondylosis, lumbar region: Secondary | ICD-10-CM | POA: Diagnosis not present

## 2020-03-16 DIAGNOSIS — D509 Iron deficiency anemia, unspecified: Secondary | ICD-10-CM | POA: Diagnosis not present

## 2020-03-16 DIAGNOSIS — M4310 Spondylolisthesis, site unspecified: Secondary | ICD-10-CM | POA: Diagnosis not present

## 2020-03-16 DIAGNOSIS — H30033 Focal chorioretinal inflammation, peripheral, bilateral: Secondary | ICD-10-CM | POA: Diagnosis not present

## 2020-03-16 DIAGNOSIS — F5104 Psychophysiologic insomnia: Secondary | ICD-10-CM | POA: Diagnosis not present

## 2020-03-16 DIAGNOSIS — K920 Hematemesis: Secondary | ICD-10-CM | POA: Diagnosis not present

## 2020-03-16 DIAGNOSIS — H349 Unspecified retinal vascular occlusion: Secondary | ICD-10-CM | POA: Diagnosis not present

## 2020-03-16 DIAGNOSIS — E7849 Other hyperlipidemia: Secondary | ICD-10-CM | POA: Diagnosis not present

## 2020-03-16 DIAGNOSIS — Z96651 Presence of right artificial knee joint: Secondary | ICD-10-CM | POA: Diagnosis not present

## 2020-03-16 DIAGNOSIS — R633 Feeding difficulties: Secondary | ICD-10-CM | POA: Diagnosis not present

## 2020-03-16 DIAGNOSIS — H5203 Hypermetropia, bilateral: Secondary | ICD-10-CM | POA: Diagnosis not present

## 2020-03-16 DIAGNOSIS — M65331 Trigger finger, right middle finger: Secondary | ICD-10-CM | POA: Diagnosis not present

## 2020-03-16 DIAGNOSIS — Z7689 Persons encountering health services in other specified circumstances: Secondary | ICD-10-CM | POA: Diagnosis not present

## 2020-03-16 DIAGNOSIS — N3010 Interstitial cystitis (chronic) without hematuria: Secondary | ICD-10-CM | POA: Diagnosis not present

## 2020-03-16 DIAGNOSIS — F4323 Adjustment disorder with mixed anxiety and depressed mood: Secondary | ICD-10-CM | POA: Diagnosis not present

## 2020-03-16 DIAGNOSIS — M79661 Pain in right lower leg: Secondary | ICD-10-CM | POA: Diagnosis not present

## 2020-03-16 DIAGNOSIS — Z124 Encounter for screening for malignant neoplasm of cervix: Secondary | ICD-10-CM | POA: Diagnosis not present

## 2020-03-16 DIAGNOSIS — R3914 Feeling of incomplete bladder emptying: Secondary | ICD-10-CM | POA: Diagnosis not present

## 2020-03-16 DIAGNOSIS — M238X2 Other internal derangements of left knee: Secondary | ICD-10-CM | POA: Diagnosis not present

## 2020-03-16 DIAGNOSIS — N39498 Other specified urinary incontinence: Secondary | ICD-10-CM | POA: Diagnosis not present

## 2020-03-16 DIAGNOSIS — Z03818 Encounter for observation for suspected exposure to other biological agents ruled out: Secondary | ICD-10-CM | POA: Diagnosis not present

## 2020-03-16 DIAGNOSIS — H401131 Primary open-angle glaucoma, bilateral, mild stage: Secondary | ICD-10-CM | POA: Diagnosis not present

## 2020-03-16 DIAGNOSIS — D510 Vitamin B12 deficiency anemia due to intrinsic factor deficiency: Secondary | ICD-10-CM | POA: Diagnosis not present

## 2020-03-16 DIAGNOSIS — F339 Major depressive disorder, recurrent, unspecified: Secondary | ICD-10-CM | POA: Diagnosis not present

## 2020-03-16 DIAGNOSIS — R109 Unspecified abdominal pain: Secondary | ICD-10-CM | POA: Diagnosis not present

## 2020-03-16 DIAGNOSIS — H6121 Impacted cerumen, right ear: Secondary | ICD-10-CM | POA: Diagnosis not present

## 2020-03-16 DIAGNOSIS — H1820 Unspecified corneal edema: Secondary | ICD-10-CM | POA: Diagnosis not present

## 2020-03-16 DIAGNOSIS — H401121 Primary open-angle glaucoma, left eye, mild stage: Secondary | ICD-10-CM | POA: Diagnosis not present

## 2020-03-16 DIAGNOSIS — N3946 Mixed incontinence: Secondary | ICD-10-CM | POA: Diagnosis not present

## 2020-03-16 DIAGNOSIS — I5022 Chronic systolic (congestive) heart failure: Secondary | ICD-10-CM | POA: Diagnosis not present

## 2020-03-16 DIAGNOSIS — Z6826 Body mass index (BMI) 26.0-26.9, adult: Secondary | ICD-10-CM | POA: Diagnosis not present

## 2020-03-16 DIAGNOSIS — D61818 Other pancytopenia: Secondary | ICD-10-CM | POA: Diagnosis not present

## 2020-03-16 DIAGNOSIS — H9319 Tinnitus, unspecified ear: Secondary | ICD-10-CM | POA: Diagnosis not present

## 2020-03-16 DIAGNOSIS — M5137 Other intervertebral disc degeneration, lumbosacral region: Secondary | ICD-10-CM | POA: Diagnosis not present

## 2020-03-16 DIAGNOSIS — D225 Melanocytic nevi of trunk: Secondary | ICD-10-CM | POA: Diagnosis not present

## 2020-03-16 DIAGNOSIS — R419 Unspecified symptoms and signs involving cognitive functions and awareness: Secondary | ICD-10-CM | POA: Diagnosis not present

## 2020-03-16 DIAGNOSIS — N939 Abnormal uterine and vaginal bleeding, unspecified: Secondary | ICD-10-CM | POA: Diagnosis not present

## 2020-03-16 DIAGNOSIS — G43711 Chronic migraine without aura, intractable, with status migrainosus: Secondary | ICD-10-CM | POA: Diagnosis not present

## 2020-03-16 DIAGNOSIS — M25411 Effusion, right shoulder: Secondary | ICD-10-CM | POA: Diagnosis not present

## 2020-03-16 DIAGNOSIS — Z72821 Inadequate sleep hygiene: Secondary | ICD-10-CM | POA: Diagnosis not present

## 2020-03-16 DIAGNOSIS — R531 Weakness: Secondary | ICD-10-CM | POA: Diagnosis not present

## 2020-03-16 DIAGNOSIS — Z1159 Encounter for screening for other viral diseases: Secondary | ICD-10-CM | POA: Diagnosis not present

## 2020-03-16 DIAGNOSIS — H5201 Hypermetropia, right eye: Secondary | ICD-10-CM | POA: Diagnosis not present

## 2020-03-16 DIAGNOSIS — F4001 Agoraphobia with panic disorder: Secondary | ICD-10-CM | POA: Diagnosis not present

## 2020-03-16 DIAGNOSIS — K2100 Gastro-esophageal reflux disease with esophagitis, without bleeding: Secondary | ICD-10-CM | POA: Diagnosis not present

## 2020-03-16 DIAGNOSIS — M19012 Primary osteoarthritis, left shoulder: Secondary | ICD-10-CM | POA: Diagnosis not present

## 2020-03-16 DIAGNOSIS — Z978 Presence of other specified devices: Secondary | ICD-10-CM | POA: Diagnosis not present

## 2020-03-16 DIAGNOSIS — Z20822 Contact with and (suspected) exposure to covid-19: Secondary | ICD-10-CM | POA: Diagnosis not present

## 2020-03-16 DIAGNOSIS — M549 Dorsalgia, unspecified: Secondary | ICD-10-CM | POA: Diagnosis not present

## 2020-03-16 DIAGNOSIS — M7541 Impingement syndrome of right shoulder: Secondary | ICD-10-CM | POA: Diagnosis not present

## 2020-03-16 DIAGNOSIS — Z9181 History of falling: Secondary | ICD-10-CM | POA: Diagnosis not present

## 2020-03-16 DIAGNOSIS — J15212 Pneumonia due to Methicillin resistant Staphylococcus aureus: Secondary | ICD-10-CM | POA: Diagnosis not present

## 2020-03-16 DIAGNOSIS — M4307 Spondylolysis, lumbosacral region: Secondary | ICD-10-CM | POA: Diagnosis not present

## 2020-03-16 DIAGNOSIS — R7303 Prediabetes: Secondary | ICD-10-CM | POA: Diagnosis not present

## 2020-03-16 DIAGNOSIS — F411 Generalized anxiety disorder: Secondary | ICD-10-CM | POA: Diagnosis not present

## 2020-03-16 DIAGNOSIS — H8309 Labyrinthitis, unspecified ear: Secondary | ICD-10-CM | POA: Diagnosis not present

## 2020-03-16 DIAGNOSIS — X58XXXA Exposure to other specified factors, initial encounter: Secondary | ICD-10-CM | POA: Diagnosis not present

## 2020-03-16 DIAGNOSIS — C9210 Chronic myeloid leukemia, BCR/ABL-positive, not having achieved remission: Secondary | ICD-10-CM | POA: Diagnosis not present

## 2020-03-16 DIAGNOSIS — S8002XA Contusion of left knee, initial encounter: Secondary | ICD-10-CM | POA: Diagnosis not present

## 2020-03-16 DIAGNOSIS — L723 Sebaceous cyst: Secondary | ICD-10-CM | POA: Diagnosis not present

## 2020-03-16 DIAGNOSIS — I4891 Unspecified atrial fibrillation: Secondary | ICD-10-CM | POA: Diagnosis not present

## 2020-03-16 DIAGNOSIS — Z6822 Body mass index (BMI) 22.0-22.9, adult: Secondary | ICD-10-CM | POA: Diagnosis not present

## 2020-03-16 DIAGNOSIS — L609 Nail disorder, unspecified: Secondary | ICD-10-CM | POA: Diagnosis not present

## 2020-03-16 DIAGNOSIS — K52832 Lymphocytic colitis: Secondary | ICD-10-CM | POA: Diagnosis not present

## 2020-03-16 DIAGNOSIS — J9611 Chronic respiratory failure with hypoxia: Secondary | ICD-10-CM | POA: Diagnosis not present

## 2020-03-16 DIAGNOSIS — E1122 Type 2 diabetes mellitus with diabetic chronic kidney disease: Secondary | ICD-10-CM | POA: Diagnosis not present

## 2020-03-16 DIAGNOSIS — L219 Seborrheic dermatitis, unspecified: Secondary | ICD-10-CM | POA: Diagnosis not present

## 2020-03-16 DIAGNOSIS — C7A8 Other malignant neuroendocrine tumors: Secondary | ICD-10-CM | POA: Diagnosis not present

## 2020-03-16 DIAGNOSIS — D751 Secondary polycythemia: Secondary | ICD-10-CM | POA: Diagnosis not present

## 2020-03-16 DIAGNOSIS — Z7189 Other specified counseling: Secondary | ICD-10-CM | POA: Diagnosis not present

## 2020-03-16 DIAGNOSIS — M65321 Trigger finger, right index finger: Secondary | ICD-10-CM | POA: Diagnosis not present

## 2020-03-16 DIAGNOSIS — E063 Autoimmune thyroiditis: Secondary | ICD-10-CM | POA: Diagnosis not present

## 2020-03-16 DIAGNOSIS — M4156 Other secondary scoliosis, lumbar region: Secondary | ICD-10-CM | POA: Diagnosis not present

## 2020-03-16 DIAGNOSIS — N949 Unspecified condition associated with female genital organs and menstrual cycle: Secondary | ICD-10-CM | POA: Diagnosis not present

## 2020-03-16 DIAGNOSIS — N1832 Chronic kidney disease, stage 3b: Secondary | ICD-10-CM | POA: Diagnosis not present

## 2020-03-16 DIAGNOSIS — Z95810 Presence of automatic (implantable) cardiac defibrillator: Secondary | ICD-10-CM | POA: Diagnosis not present

## 2020-03-16 DIAGNOSIS — L718 Other rosacea: Secondary | ICD-10-CM | POA: Diagnosis not present

## 2020-03-16 DIAGNOSIS — Z6838 Body mass index (BMI) 38.0-38.9, adult: Secondary | ICD-10-CM | POA: Diagnosis not present

## 2020-03-16 DIAGNOSIS — L308 Other specified dermatitis: Secondary | ICD-10-CM | POA: Diagnosis not present

## 2020-03-16 DIAGNOSIS — E1169 Type 2 diabetes mellitus with other specified complication: Secondary | ICD-10-CM | POA: Diagnosis not present

## 2020-03-16 DIAGNOSIS — M79672 Pain in left foot: Secondary | ICD-10-CM | POA: Diagnosis not present

## 2020-03-16 DIAGNOSIS — B354 Tinea corporis: Secondary | ICD-10-CM | POA: Diagnosis not present

## 2020-03-16 DIAGNOSIS — F419 Anxiety disorder, unspecified: Secondary | ICD-10-CM | POA: Diagnosis not present

## 2020-03-16 DIAGNOSIS — R6521 Severe sepsis with septic shock: Secondary | ICD-10-CM | POA: Diagnosis not present

## 2020-03-16 DIAGNOSIS — E890 Postprocedural hypothyroidism: Secondary | ICD-10-CM | POA: Diagnosis not present

## 2020-03-16 DIAGNOSIS — H353131 Nonexudative age-related macular degeneration, bilateral, early dry stage: Secondary | ICD-10-CM | POA: Diagnosis not present

## 2020-03-16 DIAGNOSIS — M8088XD Other osteoporosis with current pathological fracture, vertebra(e), subsequent encounter for fracture with routine healing: Secondary | ICD-10-CM | POA: Diagnosis not present

## 2020-03-16 DIAGNOSIS — I6340 Cerebral infarction due to embolism of unspecified cerebral artery: Secondary | ICD-10-CM | POA: Diagnosis not present

## 2020-03-16 DIAGNOSIS — R8290 Unspecified abnormal findings in urine: Secondary | ICD-10-CM | POA: Diagnosis not present

## 2020-03-16 DIAGNOSIS — I428 Other cardiomyopathies: Secondary | ICD-10-CM | POA: Diagnosis not present

## 2020-03-16 DIAGNOSIS — Z139 Encounter for screening, unspecified: Secondary | ICD-10-CM | POA: Diagnosis not present

## 2020-03-16 DIAGNOSIS — M2042 Other hammer toe(s) (acquired), left foot: Secondary | ICD-10-CM | POA: Diagnosis not present

## 2020-03-16 DIAGNOSIS — Z9861 Coronary angioplasty status: Secondary | ICD-10-CM | POA: Diagnosis not present

## 2020-03-16 DIAGNOSIS — D485 Neoplasm of uncertain behavior of skin: Secondary | ICD-10-CM | POA: Diagnosis not present

## 2020-03-16 DIAGNOSIS — M8580 Other specified disorders of bone density and structure, unspecified site: Secondary | ICD-10-CM | POA: Diagnosis not present

## 2020-03-16 DIAGNOSIS — Z923 Personal history of irradiation: Secondary | ICD-10-CM | POA: Diagnosis not present

## 2020-03-16 DIAGNOSIS — Z8616 Personal history of COVID-19: Secondary | ICD-10-CM | POA: Diagnosis not present

## 2020-03-16 DIAGNOSIS — M5033 Other cervical disc degeneration, cervicothoracic region: Secondary | ICD-10-CM | POA: Diagnosis not present

## 2020-03-16 DIAGNOSIS — T402X5A Adverse effect of other opioids, initial encounter: Secondary | ICD-10-CM | POA: Diagnosis not present

## 2020-03-16 DIAGNOSIS — Z8709 Personal history of other diseases of the respiratory system: Secondary | ICD-10-CM | POA: Diagnosis not present

## 2020-03-16 DIAGNOSIS — E669 Obesity, unspecified: Secondary | ICD-10-CM | POA: Diagnosis not present

## 2020-03-16 DIAGNOSIS — B0229 Other postherpetic nervous system involvement: Secondary | ICD-10-CM | POA: Diagnosis not present

## 2020-03-16 DIAGNOSIS — E038 Other specified hypothyroidism: Secondary | ICD-10-CM | POA: Diagnosis not present

## 2020-03-16 DIAGNOSIS — Z96611 Presence of right artificial shoulder joint: Secondary | ICD-10-CM | POA: Diagnosis not present

## 2020-03-16 DIAGNOSIS — Z888 Allergy status to other drugs, medicaments and biological substances status: Secondary | ICD-10-CM | POA: Diagnosis not present

## 2020-03-16 DIAGNOSIS — Z7901 Long term (current) use of anticoagulants: Secondary | ICD-10-CM | POA: Diagnosis not present

## 2020-03-16 DIAGNOSIS — J9600 Acute respiratory failure, unspecified whether with hypoxia or hypercapnia: Secondary | ICD-10-CM | POA: Diagnosis not present

## 2020-03-16 DIAGNOSIS — R918 Other nonspecific abnormal finding of lung field: Secondary | ICD-10-CM | POA: Diagnosis not present

## 2020-03-16 DIAGNOSIS — R32 Unspecified urinary incontinence: Secondary | ICD-10-CM | POA: Diagnosis not present

## 2020-03-16 DIAGNOSIS — L97401 Non-pressure chronic ulcer of unspecified heel and midfoot limited to breakdown of skin: Secondary | ICD-10-CM | POA: Diagnosis not present

## 2020-03-16 DIAGNOSIS — E1151 Type 2 diabetes mellitus with diabetic peripheral angiopathy without gangrene: Secondary | ICD-10-CM | POA: Diagnosis not present

## 2020-03-16 DIAGNOSIS — N529 Male erectile dysfunction, unspecified: Secondary | ICD-10-CM | POA: Diagnosis not present

## 2020-03-16 DIAGNOSIS — S62316A Displaced fracture of base of fifth metacarpal bone, right hand, initial encounter for closed fracture: Secondary | ICD-10-CM | POA: Diagnosis not present

## 2020-03-16 DIAGNOSIS — J431 Panlobular emphysema: Secondary | ICD-10-CM | POA: Diagnosis not present

## 2020-03-16 DIAGNOSIS — I429 Cardiomyopathy, unspecified: Secondary | ICD-10-CM | POA: Diagnosis not present

## 2020-03-16 DIAGNOSIS — L4050 Arthropathic psoriasis, unspecified: Secondary | ICD-10-CM | POA: Diagnosis not present

## 2020-03-16 DIAGNOSIS — M62838 Other muscle spasm: Secondary | ICD-10-CM | POA: Diagnosis not present

## 2020-03-16 DIAGNOSIS — I8311 Varicose veins of right lower extremity with inflammation: Secondary | ICD-10-CM | POA: Diagnosis not present

## 2020-03-16 DIAGNOSIS — F0390 Unspecified dementia without behavioral disturbance: Secondary | ICD-10-CM | POA: Diagnosis not present

## 2020-03-16 DIAGNOSIS — I129 Hypertensive chronic kidney disease with stage 1 through stage 4 chronic kidney disease, or unspecified chronic kidney disease: Secondary | ICD-10-CM | POA: Diagnosis not present

## 2020-03-16 DIAGNOSIS — Z85828 Personal history of other malignant neoplasm of skin: Secondary | ICD-10-CM | POA: Diagnosis not present

## 2020-03-16 DIAGNOSIS — N184 Chronic kidney disease, stage 4 (severe): Secondary | ICD-10-CM | POA: Diagnosis not present

## 2020-03-16 DIAGNOSIS — G2581 Restless legs syndrome: Secondary | ICD-10-CM | POA: Diagnosis not present

## 2020-03-16 DIAGNOSIS — N8110 Cystocele, unspecified: Secondary | ICD-10-CM | POA: Diagnosis not present

## 2020-03-16 DIAGNOSIS — M810 Age-related osteoporosis without current pathological fracture: Secondary | ICD-10-CM | POA: Diagnosis not present

## 2020-03-16 DIAGNOSIS — I200 Unstable angina: Secondary | ICD-10-CM | POA: Diagnosis not present

## 2020-03-16 DIAGNOSIS — S46912A Strain of unspecified muscle, fascia and tendon at shoulder and upper arm level, left arm, initial encounter: Secondary | ICD-10-CM | POA: Diagnosis not present

## 2020-03-16 DIAGNOSIS — M25452 Effusion, left hip: Secondary | ICD-10-CM | POA: Diagnosis not present

## 2020-03-16 DIAGNOSIS — R4189 Other symptoms and signs involving cognitive functions and awareness: Secondary | ICD-10-CM | POA: Diagnosis not present

## 2020-03-16 DIAGNOSIS — I700 Atherosclerosis of aorta: Secondary | ICD-10-CM | POA: Diagnosis not present

## 2020-03-16 DIAGNOSIS — S32000S Wedge compression fracture of unspecified lumbar vertebra, sequela: Secondary | ICD-10-CM | POA: Diagnosis not present

## 2020-03-16 DIAGNOSIS — R404 Transient alteration of awareness: Secondary | ICD-10-CM | POA: Diagnosis not present

## 2020-03-16 DIAGNOSIS — H527 Unspecified disorder of refraction: Secondary | ICD-10-CM | POA: Diagnosis not present

## 2020-03-16 DIAGNOSIS — Z780 Asymptomatic menopausal state: Secondary | ICD-10-CM | POA: Diagnosis not present

## 2020-03-16 DIAGNOSIS — M7062 Trochanteric bursitis, left hip: Secondary | ICD-10-CM | POA: Diagnosis not present

## 2020-03-16 DIAGNOSIS — F1190 Opioid use, unspecified, uncomplicated: Secondary | ICD-10-CM | POA: Diagnosis not present

## 2020-03-16 DIAGNOSIS — L84 Corns and callosities: Secondary | ICD-10-CM | POA: Diagnosis not present

## 2020-03-16 DIAGNOSIS — S7291XD Unspecified fracture of right femur, subsequent encounter for closed fracture with routine healing: Secondary | ICD-10-CM | POA: Diagnosis not present

## 2020-03-16 DIAGNOSIS — Z6824 Body mass index (BMI) 24.0-24.9, adult: Secondary | ICD-10-CM | POA: Diagnosis not present

## 2020-03-16 DIAGNOSIS — M47816 Spondylosis without myelopathy or radiculopathy, lumbar region: Secondary | ICD-10-CM | POA: Diagnosis not present

## 2020-03-16 DIAGNOSIS — Z96641 Presence of right artificial hip joint: Secondary | ICD-10-CM | POA: Diagnosis not present

## 2020-03-16 DIAGNOSIS — H43392 Other vitreous opacities, left eye: Secondary | ICD-10-CM | POA: Diagnosis not present

## 2020-03-16 DIAGNOSIS — H52203 Unspecified astigmatism, bilateral: Secondary | ICD-10-CM | POA: Diagnosis not present

## 2020-03-16 DIAGNOSIS — M4302 Spondylolysis, cervical region: Secondary | ICD-10-CM | POA: Diagnosis not present

## 2020-03-16 DIAGNOSIS — M47812 Spondylosis without myelopathy or radiculopathy, cervical region: Secondary | ICD-10-CM | POA: Diagnosis not present

## 2020-03-16 DIAGNOSIS — T184XXA Foreign body in colon, initial encounter: Secondary | ICD-10-CM | POA: Diagnosis not present

## 2020-03-16 DIAGNOSIS — Z01419 Encounter for gynecological examination (general) (routine) without abnormal findings: Secondary | ICD-10-CM | POA: Diagnosis not present

## 2020-03-16 DIAGNOSIS — R229 Localized swelling, mass and lump, unspecified: Secondary | ICD-10-CM | POA: Diagnosis not present

## 2020-03-16 DIAGNOSIS — N3941 Urge incontinence: Secondary | ICD-10-CM | POA: Diagnosis not present

## 2020-03-16 DIAGNOSIS — S32009K Unspecified fracture of unspecified lumbar vertebra, subsequent encounter for fracture with nonunion: Secondary | ICD-10-CM | POA: Diagnosis not present

## 2020-03-16 DIAGNOSIS — R5383 Other fatigue: Secondary | ICD-10-CM | POA: Diagnosis not present

## 2020-03-16 DIAGNOSIS — E872 Acidosis: Secondary | ICD-10-CM | POA: Diagnosis not present

## 2020-03-16 DIAGNOSIS — K635 Polyp of colon: Secondary | ICD-10-CM | POA: Diagnosis not present

## 2020-03-16 DIAGNOSIS — E1140 Type 2 diabetes mellitus with diabetic neuropathy, unspecified: Secondary | ICD-10-CM | POA: Diagnosis not present

## 2020-03-16 DIAGNOSIS — K5520 Angiodysplasia of colon without hemorrhage: Secondary | ICD-10-CM | POA: Diagnosis not present

## 2020-03-16 DIAGNOSIS — M25572 Pain in left ankle and joints of left foot: Secondary | ICD-10-CM | POA: Diagnosis not present

## 2020-03-16 DIAGNOSIS — Z96652 Presence of left artificial knee joint: Secondary | ICD-10-CM | POA: Diagnosis not present

## 2020-03-16 DIAGNOSIS — J479 Bronchiectasis, uncomplicated: Secondary | ICD-10-CM | POA: Diagnosis not present

## 2020-03-16 DIAGNOSIS — N185 Chronic kidney disease, stage 5: Secondary | ICD-10-CM | POA: Diagnosis not present

## 2020-03-16 DIAGNOSIS — R61 Generalized hyperhidrosis: Secondary | ICD-10-CM | POA: Diagnosis not present

## 2020-03-16 DIAGNOSIS — M19041 Primary osteoarthritis, right hand: Secondary | ICD-10-CM | POA: Diagnosis not present

## 2020-03-16 DIAGNOSIS — C187 Malignant neoplasm of sigmoid colon: Secondary | ICD-10-CM | POA: Diagnosis not present

## 2020-03-16 DIAGNOSIS — K551 Chronic vascular disorders of intestine: Secondary | ICD-10-CM | POA: Diagnosis not present

## 2020-03-16 DIAGNOSIS — F17210 Nicotine dependence, cigarettes, uncomplicated: Secondary | ICD-10-CM | POA: Diagnosis not present

## 2020-03-16 DIAGNOSIS — C3491 Malignant neoplasm of unspecified part of right bronchus or lung: Secondary | ICD-10-CM | POA: Diagnosis not present

## 2020-03-16 DIAGNOSIS — Z1211 Encounter for screening for malignant neoplasm of colon: Secondary | ICD-10-CM | POA: Diagnosis not present

## 2020-03-16 DIAGNOSIS — R079 Chest pain, unspecified: Secondary | ICD-10-CM | POA: Diagnosis not present

## 2020-03-16 DIAGNOSIS — R9389 Abnormal findings on diagnostic imaging of other specified body structures: Secondary | ICD-10-CM | POA: Diagnosis not present

## 2020-03-16 DIAGNOSIS — K5904 Chronic idiopathic constipation: Secondary | ICD-10-CM | POA: Diagnosis not present

## 2020-03-16 DIAGNOSIS — D708 Other neutropenia: Secondary | ICD-10-CM | POA: Diagnosis not present

## 2020-03-16 DIAGNOSIS — H5340 Unspecified visual field defects: Secondary | ICD-10-CM | POA: Diagnosis not present

## 2020-03-16 DIAGNOSIS — R7989 Other specified abnormal findings of blood chemistry: Secondary | ICD-10-CM | POA: Diagnosis not present

## 2020-03-16 DIAGNOSIS — R6520 Severe sepsis without septic shock: Secondary | ICD-10-CM | POA: Diagnosis not present

## 2020-03-16 DIAGNOSIS — G7109 Other specified muscular dystrophies: Secondary | ICD-10-CM | POA: Diagnosis not present

## 2020-03-16 DIAGNOSIS — N210 Calculus in bladder: Secondary | ICD-10-CM | POA: Diagnosis not present

## 2020-03-16 DIAGNOSIS — S143XXD Injury of brachial plexus, subsequent encounter: Secondary | ICD-10-CM | POA: Diagnosis not present

## 2020-03-16 DIAGNOSIS — C61 Malignant neoplasm of prostate: Secondary | ICD-10-CM | POA: Diagnosis not present

## 2020-03-16 DIAGNOSIS — Z8669 Personal history of other diseases of the nervous system and sense organs: Secondary | ICD-10-CM | POA: Diagnosis not present

## 2020-03-16 DIAGNOSIS — R002 Palpitations: Secondary | ICD-10-CM | POA: Diagnosis not present

## 2020-03-16 DIAGNOSIS — M8589 Other specified disorders of bone density and structure, multiple sites: Secondary | ICD-10-CM | POA: Diagnosis not present

## 2020-03-16 DIAGNOSIS — R319 Hematuria, unspecified: Secondary | ICD-10-CM | POA: Diagnosis not present

## 2020-03-16 DIAGNOSIS — I469 Cardiac arrest, cause unspecified: Secondary | ICD-10-CM | POA: Diagnosis not present

## 2020-03-16 DIAGNOSIS — R102 Pelvic and perineal pain: Secondary | ICD-10-CM | POA: Diagnosis not present

## 2020-03-16 DIAGNOSIS — I5032 Chronic diastolic (congestive) heart failure: Secondary | ICD-10-CM | POA: Diagnosis not present

## 2020-03-16 DIAGNOSIS — C642 Malignant neoplasm of left kidney, except renal pelvis: Secondary | ICD-10-CM | POA: Diagnosis not present

## 2020-03-16 DIAGNOSIS — B351 Tinea unguium: Secondary | ICD-10-CM | POA: Diagnosis not present

## 2020-03-16 DIAGNOSIS — M06031 Rheumatoid arthritis without rheumatoid factor, right wrist: Secondary | ICD-10-CM | POA: Diagnosis not present

## 2020-03-16 DIAGNOSIS — I421 Obstructive hypertrophic cardiomyopathy: Secondary | ICD-10-CM | POA: Diagnosis not present

## 2020-03-16 DIAGNOSIS — F1020 Alcohol dependence, uncomplicated: Secondary | ICD-10-CM | POA: Diagnosis not present

## 2020-03-16 DIAGNOSIS — J42 Unspecified chronic bronchitis: Secondary | ICD-10-CM | POA: Diagnosis not present

## 2020-03-16 DIAGNOSIS — K7581 Nonalcoholic steatohepatitis (NASH): Secondary | ICD-10-CM | POA: Diagnosis not present

## 2020-03-16 DIAGNOSIS — T8482XA Fibrosis due to internal orthopedic prosthetic devices, implants and grafts, initial encounter: Secondary | ICD-10-CM | POA: Diagnosis not present

## 2020-03-16 DIAGNOSIS — Z7952 Long term (current) use of systemic steroids: Secondary | ICD-10-CM | POA: Diagnosis not present

## 2020-03-16 DIAGNOSIS — E039 Hypothyroidism, unspecified: Secondary | ICD-10-CM | POA: Diagnosis not present

## 2020-03-16 DIAGNOSIS — I495 Sick sinus syndrome: Secondary | ICD-10-CM | POA: Diagnosis not present

## 2020-03-16 DIAGNOSIS — M8000XA Age-related osteoporosis with current pathological fracture, unspecified site, initial encounter for fracture: Secondary | ICD-10-CM | POA: Diagnosis not present

## 2020-03-16 DIAGNOSIS — M9904 Segmental and somatic dysfunction of sacral region: Secondary | ICD-10-CM | POA: Diagnosis not present

## 2020-03-16 DIAGNOSIS — M79602 Pain in left arm: Secondary | ICD-10-CM | POA: Diagnosis not present

## 2020-03-16 DIAGNOSIS — M7989 Other specified soft tissue disorders: Secondary | ICD-10-CM | POA: Diagnosis not present

## 2020-03-16 DIAGNOSIS — I38 Endocarditis, valve unspecified: Secondary | ICD-10-CM | POA: Diagnosis not present

## 2020-03-16 DIAGNOSIS — G894 Chronic pain syndrome: Secondary | ICD-10-CM | POA: Diagnosis not present

## 2020-03-16 DIAGNOSIS — Z91030 Bee allergy status: Secondary | ICD-10-CM | POA: Diagnosis not present

## 2020-03-16 DIAGNOSIS — E1165 Type 2 diabetes mellitus with hyperglycemia: Secondary | ICD-10-CM | POA: Diagnosis not present

## 2020-03-16 DIAGNOSIS — G4733 Obstructive sleep apnea (adult) (pediatric): Secondary | ICD-10-CM | POA: Diagnosis not present

## 2020-03-16 DIAGNOSIS — I63532 Cerebral infarction due to unspecified occlusion or stenosis of left posterior cerebral artery: Secondary | ICD-10-CM | POA: Diagnosis not present

## 2020-03-16 DIAGNOSIS — S30861A Insect bite (nonvenomous) of abdominal wall, initial encounter: Secondary | ICD-10-CM | POA: Diagnosis not present

## 2020-03-16 DIAGNOSIS — Z5111 Encounter for antineoplastic chemotherapy: Secondary | ICD-10-CM | POA: Diagnosis not present

## 2020-03-16 DIAGNOSIS — Z6833 Body mass index (BMI) 33.0-33.9, adult: Secondary | ICD-10-CM | POA: Diagnosis not present

## 2020-03-16 DIAGNOSIS — K449 Diaphragmatic hernia without obstruction or gangrene: Secondary | ICD-10-CM | POA: Diagnosis not present

## 2020-03-16 DIAGNOSIS — N183 Chronic kidney disease, stage 3 unspecified: Secondary | ICD-10-CM | POA: Diagnosis not present

## 2020-03-16 DIAGNOSIS — H524 Presbyopia: Secondary | ICD-10-CM | POA: Diagnosis not present

## 2020-03-16 DIAGNOSIS — J8410 Pulmonary fibrosis, unspecified: Secondary | ICD-10-CM | POA: Diagnosis not present

## 2020-03-16 DIAGNOSIS — L0291 Cutaneous abscess, unspecified: Secondary | ICD-10-CM | POA: Diagnosis not present

## 2020-03-16 DIAGNOSIS — G609 Hereditary and idiopathic neuropathy, unspecified: Secondary | ICD-10-CM | POA: Diagnosis not present

## 2020-03-16 DIAGNOSIS — K7460 Unspecified cirrhosis of liver: Secondary | ICD-10-CM | POA: Diagnosis not present

## 2020-03-16 DIAGNOSIS — Z96619 Presence of unspecified artificial shoulder joint: Secondary | ICD-10-CM | POA: Diagnosis not present

## 2020-03-16 DIAGNOSIS — H52201 Unspecified astigmatism, right eye: Secondary | ICD-10-CM | POA: Diagnosis not present

## 2020-03-16 DIAGNOSIS — J029 Acute pharyngitis, unspecified: Secondary | ICD-10-CM | POA: Diagnosis not present

## 2020-03-16 DIAGNOSIS — I25118 Atherosclerotic heart disease of native coronary artery with other forms of angina pectoris: Secondary | ICD-10-CM | POA: Diagnosis not present

## 2020-03-16 DIAGNOSIS — C50411 Malignant neoplasm of upper-outer quadrant of right female breast: Secondary | ICD-10-CM | POA: Diagnosis not present

## 2020-03-16 DIAGNOSIS — G903 Multi-system degeneration of the autonomic nervous system: Secondary | ICD-10-CM | POA: Diagnosis not present

## 2020-03-16 DIAGNOSIS — J4540 Moderate persistent asthma, uncomplicated: Secondary | ICD-10-CM | POA: Diagnosis not present

## 2020-03-16 DIAGNOSIS — L820 Inflamed seborrheic keratosis: Secondary | ICD-10-CM | POA: Diagnosis not present

## 2020-03-16 DIAGNOSIS — Z794 Long term (current) use of insulin: Secondary | ICD-10-CM | POA: Diagnosis not present

## 2020-03-16 DIAGNOSIS — F1994 Other psychoactive substance use, unspecified with psychoactive substance-induced mood disorder: Secondary | ICD-10-CM | POA: Diagnosis not present

## 2020-03-16 DIAGNOSIS — I83813 Varicose veins of bilateral lower extremities with pain: Secondary | ICD-10-CM | POA: Diagnosis not present

## 2020-03-16 DIAGNOSIS — S8012XA Contusion of left lower leg, initial encounter: Secondary | ICD-10-CM | POA: Diagnosis not present

## 2020-03-16 DIAGNOSIS — K222 Esophageal obstruction: Secondary | ICD-10-CM | POA: Diagnosis not present

## 2020-03-16 DIAGNOSIS — R0600 Dyspnea, unspecified: Secondary | ICD-10-CM | POA: Diagnosis not present

## 2020-03-16 DIAGNOSIS — S36039A Unspecified laceration of spleen, initial encounter: Secondary | ICD-10-CM | POA: Diagnosis not present

## 2020-03-16 DIAGNOSIS — G5702 Lesion of sciatic nerve, left lower limb: Secondary | ICD-10-CM | POA: Diagnosis not present

## 2020-03-16 DIAGNOSIS — M47817 Spondylosis without myelopathy or radiculopathy, lumbosacral region: Secondary | ICD-10-CM | POA: Diagnosis not present

## 2020-03-16 DIAGNOSIS — G8194 Hemiplegia, unspecified affecting left nondominant side: Secondary | ICD-10-CM | POA: Diagnosis not present

## 2020-03-16 DIAGNOSIS — R2681 Unsteadiness on feet: Secondary | ICD-10-CM | POA: Diagnosis not present

## 2020-03-16 DIAGNOSIS — I472 Ventricular tachycardia: Secondary | ICD-10-CM | POA: Diagnosis not present

## 2020-03-16 DIAGNOSIS — M25552 Pain in left hip: Secondary | ICD-10-CM | POA: Diagnosis not present

## 2020-03-16 DIAGNOSIS — S46012D Strain of muscle(s) and tendon(s) of the rotator cuff of left shoulder, subsequent encounter: Secondary | ICD-10-CM | POA: Diagnosis not present

## 2020-03-16 DIAGNOSIS — M25611 Stiffness of right shoulder, not elsewhere classified: Secondary | ICD-10-CM | POA: Diagnosis not present

## 2020-03-16 DIAGNOSIS — M21372 Foot drop, left foot: Secondary | ICD-10-CM | POA: Diagnosis not present

## 2020-03-16 DIAGNOSIS — Z89611 Acquired absence of right leg above knee: Secondary | ICD-10-CM | POA: Diagnosis not present

## 2020-03-16 DIAGNOSIS — G5712 Meralgia paresthetica, left lower limb: Secondary | ICD-10-CM | POA: Diagnosis not present

## 2020-03-16 DIAGNOSIS — H5213 Myopia, bilateral: Secondary | ICD-10-CM | POA: Diagnosis not present

## 2020-03-16 DIAGNOSIS — M9902 Segmental and somatic dysfunction of thoracic region: Secondary | ICD-10-CM | POA: Diagnosis not present

## 2020-03-16 DIAGNOSIS — J3489 Other specified disorders of nose and nasal sinuses: Secondary | ICD-10-CM | POA: Diagnosis not present

## 2020-03-16 DIAGNOSIS — I359 Nonrheumatic aortic valve disorder, unspecified: Secondary | ICD-10-CM | POA: Diagnosis not present

## 2020-03-16 DIAGNOSIS — R768 Other specified abnormal immunological findings in serum: Secondary | ICD-10-CM | POA: Diagnosis not present

## 2020-03-16 DIAGNOSIS — M898X7 Other specified disorders of bone, ankle and foot: Secondary | ICD-10-CM | POA: Diagnosis not present

## 2020-03-16 DIAGNOSIS — M9903 Segmental and somatic dysfunction of lumbar region: Secondary | ICD-10-CM | POA: Diagnosis not present

## 2020-03-16 DIAGNOSIS — I83899 Varicose veins of unspecified lower extremities with other complications: Secondary | ICD-10-CM | POA: Diagnosis not present

## 2020-03-16 DIAGNOSIS — L245 Irritant contact dermatitis due to other chemical products: Secondary | ICD-10-CM | POA: Diagnosis not present

## 2020-03-16 DIAGNOSIS — M25622 Stiffness of left elbow, not elsewhere classified: Secondary | ICD-10-CM | POA: Diagnosis not present

## 2020-03-16 DIAGNOSIS — L600 Ingrowing nail: Secondary | ICD-10-CM | POA: Diagnosis not present

## 2020-03-16 DIAGNOSIS — B349 Viral infection, unspecified: Secondary | ICD-10-CM | POA: Diagnosis not present

## 2020-03-16 DIAGNOSIS — K2270 Barrett's esophagus without dysplasia: Secondary | ICD-10-CM | POA: Diagnosis not present

## 2020-03-16 DIAGNOSIS — G9341 Metabolic encephalopathy: Secondary | ICD-10-CM | POA: Diagnosis not present

## 2020-03-16 DIAGNOSIS — Z13820 Encounter for screening for osteoporosis: Secondary | ICD-10-CM | POA: Diagnosis not present

## 2020-03-16 DIAGNOSIS — M2392 Unspecified internal derangement of left knee: Secondary | ICD-10-CM | POA: Diagnosis not present

## 2020-03-16 DIAGNOSIS — M75121 Complete rotator cuff tear or rupture of right shoulder, not specified as traumatic: Secondary | ICD-10-CM | POA: Diagnosis not present

## 2020-03-16 DIAGNOSIS — N898 Other specified noninflammatory disorders of vagina: Secondary | ICD-10-CM | POA: Diagnosis not present

## 2020-03-16 DIAGNOSIS — W57XXXA Bitten or stung by nonvenomous insect and other nonvenomous arthropods, initial encounter: Secondary | ICD-10-CM | POA: Diagnosis not present

## 2020-03-16 DIAGNOSIS — D519 Vitamin B12 deficiency anemia, unspecified: Secondary | ICD-10-CM | POA: Diagnosis not present

## 2020-03-16 DIAGNOSIS — E781 Pure hyperglyceridemia: Secondary | ICD-10-CM | POA: Diagnosis not present

## 2020-03-16 DIAGNOSIS — H43823 Vitreomacular adhesion, bilateral: Secondary | ICD-10-CM | POA: Diagnosis not present

## 2020-03-16 DIAGNOSIS — D508 Other iron deficiency anemias: Secondary | ICD-10-CM | POA: Diagnosis not present

## 2020-03-16 DIAGNOSIS — H40023 Open angle with borderline findings, high risk, bilateral: Secondary | ICD-10-CM | POA: Diagnosis not present

## 2020-03-16 DIAGNOSIS — M129 Arthropathy, unspecified: Secondary | ICD-10-CM | POA: Diagnosis not present

## 2020-03-16 DIAGNOSIS — Z961 Presence of intraocular lens: Secondary | ICD-10-CM | POA: Diagnosis not present

## 2020-03-16 DIAGNOSIS — D6851 Activated protein C resistance: Secondary | ICD-10-CM | POA: Diagnosis not present

## 2020-03-16 DIAGNOSIS — R310 Gross hematuria: Secondary | ICD-10-CM | POA: Diagnosis not present

## 2020-03-16 DIAGNOSIS — I6523 Occlusion and stenosis of bilateral carotid arteries: Secondary | ICD-10-CM | POA: Diagnosis not present

## 2020-03-16 DIAGNOSIS — J9601 Acute respiratory failure with hypoxia: Secondary | ICD-10-CM | POA: Diagnosis not present

## 2020-03-16 DIAGNOSIS — E785 Hyperlipidemia, unspecified: Secondary | ICD-10-CM | POA: Diagnosis not present

## 2020-03-16 DIAGNOSIS — G5601 Carpal tunnel syndrome, right upper limb: Secondary | ICD-10-CM | POA: Diagnosis not present

## 2020-03-16 DIAGNOSIS — I63013 Cerebral infarction due to thrombosis of bilateral vertebral arteries: Secondary | ICD-10-CM | POA: Diagnosis not present

## 2020-03-16 DIAGNOSIS — L630 Alopecia (capitis) totalis: Secondary | ICD-10-CM | POA: Diagnosis not present

## 2020-03-16 DIAGNOSIS — L03213 Periorbital cellulitis: Secondary | ICD-10-CM | POA: Diagnosis not present

## 2020-03-16 DIAGNOSIS — C19 Malignant neoplasm of rectosigmoid junction: Secondary | ICD-10-CM | POA: Diagnosis not present

## 2020-03-16 DIAGNOSIS — M150 Primary generalized (osteo)arthritis: Secondary | ICD-10-CM | POA: Diagnosis not present

## 2020-03-16 DIAGNOSIS — Z01818 Encounter for other preprocedural examination: Secondary | ICD-10-CM | POA: Diagnosis not present

## 2020-03-16 DIAGNOSIS — M50123 Cervical disc disorder at C6-C7 level with radiculopathy: Secondary | ICD-10-CM | POA: Diagnosis not present

## 2020-03-16 DIAGNOSIS — H40013 Open angle with borderline findings, low risk, bilateral: Secondary | ICD-10-CM | POA: Diagnosis not present

## 2020-03-16 DIAGNOSIS — I639 Cerebral infarction, unspecified: Secondary | ICD-10-CM | POA: Diagnosis not present

## 2020-03-16 DIAGNOSIS — M5412 Radiculopathy, cervical region: Secondary | ICD-10-CM | POA: Diagnosis not present

## 2020-03-16 DIAGNOSIS — Z170 Estrogen receptor positive status [ER+]: Secondary | ICD-10-CM | POA: Diagnosis not present

## 2020-03-16 DIAGNOSIS — H1012 Acute atopic conjunctivitis, left eye: Secondary | ICD-10-CM | POA: Diagnosis not present

## 2020-03-16 DIAGNOSIS — F418 Other specified anxiety disorders: Secondary | ICD-10-CM | POA: Diagnosis not present

## 2020-03-16 DIAGNOSIS — N6489 Other specified disorders of breast: Secondary | ICD-10-CM | POA: Diagnosis not present

## 2020-03-16 DIAGNOSIS — H25013 Cortical age-related cataract, bilateral: Secondary | ICD-10-CM | POA: Diagnosis not present

## 2020-03-16 DIAGNOSIS — F5089 Other specified eating disorder: Secondary | ICD-10-CM | POA: Diagnosis not present

## 2020-03-16 DIAGNOSIS — J302 Other seasonal allergic rhinitis: Secondary | ICD-10-CM | POA: Diagnosis not present

## 2020-03-16 DIAGNOSIS — D494 Neoplasm of unspecified behavior of bladder: Secondary | ICD-10-CM | POA: Diagnosis not present

## 2020-03-16 DIAGNOSIS — I499 Cardiac arrhythmia, unspecified: Secondary | ICD-10-CM | POA: Diagnosis not present

## 2020-03-16 DIAGNOSIS — I9789 Other postprocedural complications and disorders of the circulatory system, not elsewhere classified: Secondary | ICD-10-CM | POA: Diagnosis not present

## 2020-03-16 DIAGNOSIS — K529 Noninfective gastroenteritis and colitis, unspecified: Secondary | ICD-10-CM | POA: Diagnosis not present

## 2020-03-16 DIAGNOSIS — F332 Major depressive disorder, recurrent severe without psychotic features: Secondary | ICD-10-CM | POA: Diagnosis not present

## 2020-03-16 DIAGNOSIS — S52571A Other intraarticular fracture of lower end of right radius, initial encounter for closed fracture: Secondary | ICD-10-CM | POA: Diagnosis not present

## 2020-03-16 DIAGNOSIS — J432 Centrilobular emphysema: Secondary | ICD-10-CM | POA: Diagnosis not present

## 2020-03-16 DIAGNOSIS — M1611 Unilateral primary osteoarthritis, right hip: Secondary | ICD-10-CM | POA: Diagnosis not present

## 2020-03-16 DIAGNOSIS — M545 Low back pain: Secondary | ICD-10-CM | POA: Diagnosis not present

## 2020-03-16 DIAGNOSIS — N2581 Secondary hyperparathyroidism of renal origin: Secondary | ICD-10-CM | POA: Diagnosis not present

## 2020-03-16 DIAGNOSIS — J449 Chronic obstructive pulmonary disease, unspecified: Secondary | ICD-10-CM | POA: Diagnosis not present

## 2020-03-16 DIAGNOSIS — C4490 Unspecified malignant neoplasm of skin, unspecified: Secondary | ICD-10-CM | POA: Diagnosis not present

## 2020-03-16 DIAGNOSIS — M25461 Effusion, right knee: Secondary | ICD-10-CM | POA: Diagnosis not present

## 2020-03-16 DIAGNOSIS — M14671 Charcot's joint, right ankle and foot: Secondary | ICD-10-CM | POA: Diagnosis not present

## 2020-03-16 DIAGNOSIS — M25539 Pain in unspecified wrist: Secondary | ICD-10-CM | POA: Diagnosis not present

## 2020-03-17 DIAGNOSIS — I440 Atrioventricular block, first degree: Secondary | ICD-10-CM | POA: Diagnosis not present

## 2020-03-17 DIAGNOSIS — R200 Anesthesia of skin: Secondary | ICD-10-CM | POA: Diagnosis not present

## 2020-03-17 DIAGNOSIS — Z2821 Immunization not carried out because of patient refusal: Secondary | ICD-10-CM | POA: Diagnosis not present

## 2020-03-17 DIAGNOSIS — F17210 Nicotine dependence, cigarettes, uncomplicated: Secondary | ICD-10-CM | POA: Diagnosis not present

## 2020-03-17 DIAGNOSIS — M5416 Radiculopathy, lumbar region: Secondary | ICD-10-CM | POA: Diagnosis not present

## 2020-03-17 DIAGNOSIS — I350 Nonrheumatic aortic (valve) stenosis: Secondary | ICD-10-CM | POA: Diagnosis not present

## 2020-03-17 DIAGNOSIS — I10 Essential (primary) hypertension: Secondary | ICD-10-CM | POA: Diagnosis not present

## 2020-03-17 DIAGNOSIS — I4821 Permanent atrial fibrillation: Secondary | ICD-10-CM | POA: Diagnosis not present

## 2020-03-17 DIAGNOSIS — C50912 Malignant neoplasm of unspecified site of left female breast: Secondary | ICD-10-CM | POA: Diagnosis not present

## 2020-03-17 DIAGNOSIS — M65342 Trigger finger, left ring finger: Secondary | ICD-10-CM | POA: Diagnosis not present

## 2020-03-17 DIAGNOSIS — I63013 Cerebral infarction due to thrombosis of bilateral vertebral arteries: Secondary | ICD-10-CM | POA: Diagnosis not present

## 2020-03-17 DIAGNOSIS — H16142 Punctate keratitis, left eye: Secondary | ICD-10-CM | POA: Diagnosis not present

## 2020-03-17 DIAGNOSIS — F515 Nightmare disorder: Secondary | ICD-10-CM | POA: Diagnosis not present

## 2020-03-17 DIAGNOSIS — R011 Cardiac murmur, unspecified: Secondary | ICD-10-CM | POA: Diagnosis not present

## 2020-03-17 DIAGNOSIS — K047 Periapical abscess without sinus: Secondary | ICD-10-CM | POA: Diagnosis not present

## 2020-03-17 DIAGNOSIS — M25561 Pain in right knee: Secondary | ICD-10-CM | POA: Diagnosis not present

## 2020-03-17 DIAGNOSIS — Z23 Encounter for immunization: Secondary | ICD-10-CM | POA: Diagnosis not present

## 2020-03-17 DIAGNOSIS — M25511 Pain in right shoulder: Secondary | ICD-10-CM | POA: Diagnosis not present

## 2020-03-17 DIAGNOSIS — D2372 Other benign neoplasm of skin of left lower limb, including hip: Secondary | ICD-10-CM | POA: Diagnosis not present

## 2020-03-17 DIAGNOSIS — J4540 Moderate persistent asthma, uncomplicated: Secondary | ICD-10-CM | POA: Diagnosis not present

## 2020-03-17 DIAGNOSIS — F5105 Insomnia due to other mental disorder: Secondary | ICD-10-CM | POA: Diagnosis not present

## 2020-03-17 DIAGNOSIS — Z01419 Encounter for gynecological examination (general) (routine) without abnormal findings: Secondary | ICD-10-CM | POA: Diagnosis not present

## 2020-03-17 DIAGNOSIS — M8949 Other hypertrophic osteoarthropathy, multiple sites: Secondary | ICD-10-CM | POA: Diagnosis not present

## 2020-03-17 DIAGNOSIS — J301 Allergic rhinitis due to pollen: Secondary | ICD-10-CM | POA: Diagnosis not present

## 2020-03-17 DIAGNOSIS — E291 Testicular hypofunction: Secondary | ICD-10-CM | POA: Diagnosis not present

## 2020-03-17 DIAGNOSIS — S0592XS Unspecified injury of left eye and orbit, sequela: Secondary | ICD-10-CM | POA: Diagnosis not present

## 2020-03-17 DIAGNOSIS — S30861A Insect bite (nonvenomous) of abdominal wall, initial encounter: Secondary | ICD-10-CM | POA: Diagnosis not present

## 2020-03-17 DIAGNOSIS — M4726 Other spondylosis with radiculopathy, lumbar region: Secondary | ICD-10-CM | POA: Diagnosis not present

## 2020-03-17 DIAGNOSIS — Z6841 Body Mass Index (BMI) 40.0 and over, adult: Secondary | ICD-10-CM | POA: Diagnosis not present

## 2020-03-17 DIAGNOSIS — E611 Iron deficiency: Secondary | ICD-10-CM | POA: Diagnosis not present

## 2020-03-17 DIAGNOSIS — G2581 Restless legs syndrome: Secondary | ICD-10-CM | POA: Diagnosis not present

## 2020-03-17 DIAGNOSIS — S22000A Wedge compression fracture of unspecified thoracic vertebra, initial encounter for closed fracture: Secondary | ICD-10-CM | POA: Diagnosis not present

## 2020-03-17 DIAGNOSIS — C3411 Malignant neoplasm of upper lobe, right bronchus or lung: Secondary | ICD-10-CM | POA: Diagnosis not present

## 2020-03-17 DIAGNOSIS — M545 Low back pain: Secondary | ICD-10-CM | POA: Diagnosis not present

## 2020-03-17 DIAGNOSIS — M778 Other enthesopathies, not elsewhere classified: Secondary | ICD-10-CM | POA: Diagnosis not present

## 2020-03-17 DIAGNOSIS — K2100 Gastro-esophageal reflux disease with esophagitis, without bleeding: Secondary | ICD-10-CM | POA: Diagnosis not present

## 2020-03-17 DIAGNOSIS — L89152 Pressure ulcer of sacral region, stage 2: Secondary | ICD-10-CM | POA: Diagnosis not present

## 2020-03-17 DIAGNOSIS — Z85118 Personal history of other malignant neoplasm of bronchus and lung: Secondary | ICD-10-CM | POA: Diagnosis not present

## 2020-03-17 DIAGNOSIS — E139 Other specified diabetes mellitus without complications: Secondary | ICD-10-CM | POA: Diagnosis not present

## 2020-03-17 DIAGNOSIS — Z83518 Family history of other specified eye disorder: Secondary | ICD-10-CM | POA: Diagnosis not present

## 2020-03-17 DIAGNOSIS — G8929 Other chronic pain: Secondary | ICD-10-CM | POA: Diagnosis not present

## 2020-03-17 DIAGNOSIS — J9621 Acute and chronic respiratory failure with hypoxia: Secondary | ICD-10-CM | POA: Diagnosis not present

## 2020-03-17 DIAGNOSIS — C641 Malignant neoplasm of right kidney, except renal pelvis: Secondary | ICD-10-CM | POA: Diagnosis not present

## 2020-03-17 DIAGNOSIS — Z89411 Acquired absence of right great toe: Secondary | ICD-10-CM | POA: Diagnosis not present

## 2020-03-17 DIAGNOSIS — Z4789 Encounter for other orthopedic aftercare: Secondary | ICD-10-CM | POA: Diagnosis not present

## 2020-03-17 DIAGNOSIS — B009 Herpesviral infection, unspecified: Secondary | ICD-10-CM | POA: Diagnosis not present

## 2020-03-17 DIAGNOSIS — R1031 Right lower quadrant pain: Secondary | ICD-10-CM | POA: Diagnosis not present

## 2020-03-17 DIAGNOSIS — M6281 Muscle weakness (generalized): Secondary | ICD-10-CM | POA: Diagnosis not present

## 2020-03-17 DIAGNOSIS — D508 Other iron deficiency anemias: Secondary | ICD-10-CM | POA: Diagnosis not present

## 2020-03-17 DIAGNOSIS — R195 Other fecal abnormalities: Secondary | ICD-10-CM | POA: Diagnosis not present

## 2020-03-17 DIAGNOSIS — C50312 Malignant neoplasm of lower-inner quadrant of left female breast: Secondary | ICD-10-CM | POA: Diagnosis not present

## 2020-03-17 DIAGNOSIS — H6993 Unspecified Eustachian tube disorder, bilateral: Secondary | ICD-10-CM | POA: Diagnosis not present

## 2020-03-17 DIAGNOSIS — H25812 Combined forms of age-related cataract, left eye: Secondary | ICD-10-CM | POA: Diagnosis not present

## 2020-03-17 DIAGNOSIS — Z7409 Other reduced mobility: Secondary | ICD-10-CM | POA: Diagnosis not present

## 2020-03-17 DIAGNOSIS — I77810 Thoracic aortic ectasia: Secondary | ICD-10-CM | POA: Diagnosis not present

## 2020-03-17 DIAGNOSIS — Z471 Aftercare following joint replacement surgery: Secondary | ICD-10-CM | POA: Diagnosis not present

## 2020-03-17 DIAGNOSIS — I5020 Unspecified systolic (congestive) heart failure: Secondary | ICD-10-CM | POA: Diagnosis not present

## 2020-03-17 DIAGNOSIS — D638 Anemia in other chronic diseases classified elsewhere: Secondary | ICD-10-CM | POA: Diagnosis not present

## 2020-03-17 DIAGNOSIS — G8194 Hemiplegia, unspecified affecting left nondominant side: Secondary | ICD-10-CM | POA: Diagnosis not present

## 2020-03-17 DIAGNOSIS — E109 Type 1 diabetes mellitus without complications: Secondary | ICD-10-CM | POA: Diagnosis not present

## 2020-03-17 DIAGNOSIS — S0990XA Unspecified injury of head, initial encounter: Secondary | ICD-10-CM | POA: Diagnosis not present

## 2020-03-17 DIAGNOSIS — H25013 Cortical age-related cataract, bilateral: Secondary | ICD-10-CM | POA: Diagnosis not present

## 2020-03-17 DIAGNOSIS — I259 Chronic ischemic heart disease, unspecified: Secondary | ICD-10-CM | POA: Diagnosis not present

## 2020-03-17 DIAGNOSIS — J441 Chronic obstructive pulmonary disease with (acute) exacerbation: Secondary | ICD-10-CM | POA: Diagnosis not present

## 2020-03-17 DIAGNOSIS — M25552 Pain in left hip: Secondary | ICD-10-CM | POA: Diagnosis not present

## 2020-03-17 DIAGNOSIS — R635 Abnormal weight gain: Secondary | ICD-10-CM | POA: Diagnosis not present

## 2020-03-17 DIAGNOSIS — R300 Dysuria: Secondary | ICD-10-CM | POA: Diagnosis not present

## 2020-03-17 DIAGNOSIS — M47814 Spondylosis without myelopathy or radiculopathy, thoracic region: Secondary | ICD-10-CM | POA: Diagnosis not present

## 2020-03-17 DIAGNOSIS — E876 Hypokalemia: Secondary | ICD-10-CM | POA: Diagnosis not present

## 2020-03-17 DIAGNOSIS — S066X5S Traumatic subarachnoid hemorrhage with loss of consciousness greater than 24 hours with return to pre-existing conscious level, sequela: Secondary | ICD-10-CM | POA: Diagnosis not present

## 2020-03-17 DIAGNOSIS — Z8673 Personal history of transient ischemic attack (TIA), and cerebral infarction without residual deficits: Secondary | ICD-10-CM | POA: Diagnosis not present

## 2020-03-17 DIAGNOSIS — H25811 Combined forms of age-related cataract, right eye: Secondary | ICD-10-CM | POA: Diagnosis not present

## 2020-03-17 DIAGNOSIS — S065X5S Traumatic subdural hemorrhage with loss of consciousness greater than 24 hours with return to pre-existing conscious level, sequela: Secondary | ICD-10-CM | POA: Diagnosis not present

## 2020-03-17 DIAGNOSIS — Z90710 Acquired absence of both cervix and uterus: Secondary | ICD-10-CM | POA: Diagnosis not present

## 2020-03-17 DIAGNOSIS — I700 Atherosclerosis of aorta: Secondary | ICD-10-CM | POA: Diagnosis not present

## 2020-03-17 DIAGNOSIS — M79604 Pain in right leg: Secondary | ICD-10-CM | POA: Diagnosis not present

## 2020-03-17 DIAGNOSIS — M1711 Unilateral primary osteoarthritis, right knee: Secondary | ICD-10-CM | POA: Diagnosis not present

## 2020-03-17 DIAGNOSIS — I6322 Cerebral infarction due to unspecified occlusion or stenosis of basilar arteries: Secondary | ICD-10-CM | POA: Diagnosis not present

## 2020-03-17 DIAGNOSIS — I82502 Chronic embolism and thrombosis of unspecified deep veins of left lower extremity: Secondary | ICD-10-CM | POA: Diagnosis not present

## 2020-03-17 DIAGNOSIS — M85852 Other specified disorders of bone density and structure, left thigh: Secondary | ICD-10-CM | POA: Diagnosis not present

## 2020-03-17 DIAGNOSIS — R194 Change in bowel habit: Secondary | ICD-10-CM | POA: Diagnosis not present

## 2020-03-17 DIAGNOSIS — L501 Idiopathic urticaria: Secondary | ICD-10-CM | POA: Diagnosis not present

## 2020-03-17 DIAGNOSIS — R6521 Severe sepsis with septic shock: Secondary | ICD-10-CM | POA: Diagnosis not present

## 2020-03-17 DIAGNOSIS — I5022 Chronic systolic (congestive) heart failure: Secondary | ICD-10-CM | POA: Diagnosis not present

## 2020-03-17 DIAGNOSIS — L710 Perioral dermatitis: Secondary | ICD-10-CM | POA: Diagnosis not present

## 2020-03-17 DIAGNOSIS — F411 Generalized anxiety disorder: Secondary | ICD-10-CM | POA: Diagnosis not present

## 2020-03-17 DIAGNOSIS — G540 Brachial plexus disorders: Secondary | ICD-10-CM | POA: Diagnosis not present

## 2020-03-17 DIAGNOSIS — C50412 Malignant neoplasm of upper-outer quadrant of left female breast: Secondary | ICD-10-CM | POA: Diagnosis not present

## 2020-03-17 DIAGNOSIS — D496 Neoplasm of unspecified behavior of brain: Secondary | ICD-10-CM | POA: Diagnosis not present

## 2020-03-17 DIAGNOSIS — E781 Pure hyperglyceridemia: Secondary | ICD-10-CM | POA: Diagnosis not present

## 2020-03-17 DIAGNOSIS — G629 Polyneuropathy, unspecified: Secondary | ICD-10-CM | POA: Diagnosis not present

## 2020-03-17 DIAGNOSIS — H903 Sensorineural hearing loss, bilateral: Secondary | ICD-10-CM | POA: Diagnosis not present

## 2020-03-17 DIAGNOSIS — I25810 Atherosclerosis of coronary artery bypass graft(s) without angina pectoris: Secondary | ICD-10-CM | POA: Diagnosis not present

## 2020-03-17 DIAGNOSIS — Z7901 Long term (current) use of anticoagulants: Secondary | ICD-10-CM | POA: Diagnosis not present

## 2020-03-17 DIAGNOSIS — N200 Calculus of kidney: Secondary | ICD-10-CM | POA: Diagnosis not present

## 2020-03-17 DIAGNOSIS — F1421 Cocaine dependence, in remission: Secondary | ICD-10-CM | POA: Diagnosis not present

## 2020-03-17 DIAGNOSIS — I120 Hypertensive chronic kidney disease with stage 5 chronic kidney disease or end stage renal disease: Secondary | ICD-10-CM | POA: Diagnosis not present

## 2020-03-17 DIAGNOSIS — K5080 Crohn's disease of both small and large intestine without complications: Secondary | ICD-10-CM | POA: Diagnosis not present

## 2020-03-17 DIAGNOSIS — R109 Unspecified abdominal pain: Secondary | ICD-10-CM | POA: Diagnosis not present

## 2020-03-17 DIAGNOSIS — N3000 Acute cystitis without hematuria: Secondary | ICD-10-CM | POA: Diagnosis not present

## 2020-03-17 DIAGNOSIS — M5116 Intervertebral disc disorders with radiculopathy, lumbar region: Secondary | ICD-10-CM | POA: Diagnosis not present

## 2020-03-17 DIAGNOSIS — M86671 Other chronic osteomyelitis, right ankle and foot: Secondary | ICD-10-CM | POA: Diagnosis not present

## 2020-03-17 DIAGNOSIS — D0422 Carcinoma in situ of skin of left ear and external auricular canal: Secondary | ICD-10-CM | POA: Diagnosis not present

## 2020-03-17 DIAGNOSIS — R309 Painful micturition, unspecified: Secondary | ICD-10-CM | POA: Diagnosis not present

## 2020-03-17 DIAGNOSIS — R030 Elevated blood-pressure reading, without diagnosis of hypertension: Secondary | ICD-10-CM | POA: Diagnosis not present

## 2020-03-17 DIAGNOSIS — J9690 Respiratory failure, unspecified, unspecified whether with hypoxia or hypercapnia: Secondary | ICD-10-CM | POA: Diagnosis not present

## 2020-03-17 DIAGNOSIS — C9210 Chronic myeloid leukemia, BCR/ABL-positive, not having achieved remission: Secondary | ICD-10-CM | POA: Diagnosis not present

## 2020-03-17 DIAGNOSIS — C9001 Multiple myeloma in remission: Secondary | ICD-10-CM | POA: Diagnosis not present

## 2020-03-17 DIAGNOSIS — H6123 Impacted cerumen, bilateral: Secondary | ICD-10-CM | POA: Diagnosis not present

## 2020-03-17 DIAGNOSIS — M150 Primary generalized (osteo)arthritis: Secondary | ICD-10-CM | POA: Diagnosis not present

## 2020-03-17 DIAGNOSIS — L820 Inflamed seborrheic keratosis: Secondary | ICD-10-CM | POA: Diagnosis not present

## 2020-03-17 DIAGNOSIS — H1045 Other chronic allergic conjunctivitis: Secondary | ICD-10-CM | POA: Diagnosis not present

## 2020-03-17 DIAGNOSIS — N1832 Chronic kidney disease, stage 3b: Secondary | ICD-10-CM | POA: Diagnosis not present

## 2020-03-17 DIAGNOSIS — N184 Chronic kidney disease, stage 4 (severe): Secondary | ICD-10-CM | POA: Diagnosis not present

## 2020-03-17 DIAGNOSIS — R2681 Unsteadiness on feet: Secondary | ICD-10-CM | POA: Diagnosis not present

## 2020-03-17 DIAGNOSIS — N83201 Unspecified ovarian cyst, right side: Secondary | ICD-10-CM | POA: Diagnosis not present

## 2020-03-17 DIAGNOSIS — F17200 Nicotine dependence, unspecified, uncomplicated: Secondary | ICD-10-CM | POA: Diagnosis not present

## 2020-03-17 DIAGNOSIS — N3941 Urge incontinence: Secondary | ICD-10-CM | POA: Diagnosis not present

## 2020-03-17 DIAGNOSIS — Z981 Arthrodesis status: Secondary | ICD-10-CM | POA: Diagnosis not present

## 2020-03-17 DIAGNOSIS — D61818 Other pancytopenia: Secondary | ICD-10-CM | POA: Diagnosis not present

## 2020-03-17 DIAGNOSIS — Z8719 Personal history of other diseases of the digestive system: Secondary | ICD-10-CM | POA: Diagnosis not present

## 2020-03-17 DIAGNOSIS — I679 Cerebrovascular disease, unspecified: Secondary | ICD-10-CM | POA: Diagnosis not present

## 2020-03-17 DIAGNOSIS — Z03818 Encounter for observation for suspected exposure to other biological agents ruled out: Secondary | ICD-10-CM | POA: Diagnosis not present

## 2020-03-17 DIAGNOSIS — Z170 Estrogen receptor positive status [ER+]: Secondary | ICD-10-CM | POA: Diagnosis not present

## 2020-03-17 DIAGNOSIS — R519 Headache, unspecified: Secondary | ICD-10-CM | POA: Diagnosis not present

## 2020-03-17 DIAGNOSIS — L02214 Cutaneous abscess of groin: Secondary | ICD-10-CM | POA: Diagnosis not present

## 2020-03-17 DIAGNOSIS — W57XXXA Bitten or stung by nonvenomous insect and other nonvenomous arthropods, initial encounter: Secondary | ICD-10-CM | POA: Diagnosis not present

## 2020-03-17 DIAGNOSIS — N62 Hypertrophy of breast: Secondary | ICD-10-CM | POA: Diagnosis not present

## 2020-03-17 DIAGNOSIS — R29898 Other symptoms and signs involving the musculoskeletal system: Secondary | ICD-10-CM | POA: Diagnosis not present

## 2020-03-17 DIAGNOSIS — M25551 Pain in right hip: Secondary | ICD-10-CM | POA: Diagnosis not present

## 2020-03-17 DIAGNOSIS — N35919 Unspecified urethral stricture, male, unspecified site: Secondary | ICD-10-CM | POA: Diagnosis not present

## 2020-03-17 DIAGNOSIS — A048 Other specified bacterial intestinal infections: Secondary | ICD-10-CM | POA: Diagnosis not present

## 2020-03-17 DIAGNOSIS — Z21 Asymptomatic human immunodeficiency virus [HIV] infection status: Secondary | ICD-10-CM | POA: Diagnosis not present

## 2020-03-17 DIAGNOSIS — M7671 Peroneal tendinitis, right leg: Secondary | ICD-10-CM | POA: Diagnosis not present

## 2020-03-17 DIAGNOSIS — Z6832 Body mass index (BMI) 32.0-32.9, adult: Secondary | ICD-10-CM | POA: Diagnosis not present

## 2020-03-17 DIAGNOSIS — H35411 Lattice degeneration of retina, right eye: Secondary | ICD-10-CM | POA: Diagnosis not present

## 2020-03-17 DIAGNOSIS — E7800 Pure hypercholesterolemia, unspecified: Secondary | ICD-10-CM | POA: Diagnosis not present

## 2020-03-17 DIAGNOSIS — Z8781 Personal history of (healed) traumatic fracture: Secondary | ICD-10-CM | POA: Diagnosis not present

## 2020-03-17 DIAGNOSIS — M654 Radial styloid tenosynovitis [de Quervain]: Secondary | ICD-10-CM | POA: Diagnosis not present

## 2020-03-17 DIAGNOSIS — M47817 Spondylosis without myelopathy or radiculopathy, lumbosacral region: Secondary | ICD-10-CM | POA: Diagnosis not present

## 2020-03-17 DIAGNOSIS — J392 Other diseases of pharynx: Secondary | ICD-10-CM | POA: Diagnosis not present

## 2020-03-17 DIAGNOSIS — R001 Bradycardia, unspecified: Secondary | ICD-10-CM | POA: Diagnosis not present

## 2020-03-17 DIAGNOSIS — J0101 Acute recurrent maxillary sinusitis: Secondary | ICD-10-CM | POA: Diagnosis not present

## 2020-03-17 DIAGNOSIS — S0285XS Fracture of orbit, unspecified, sequela: Secondary | ICD-10-CM | POA: Diagnosis not present

## 2020-03-17 DIAGNOSIS — F902 Attention-deficit hyperactivity disorder, combined type: Secondary | ICD-10-CM | POA: Diagnosis not present

## 2020-03-17 DIAGNOSIS — Z66 Do not resuscitate: Secondary | ICD-10-CM | POA: Diagnosis not present

## 2020-03-17 DIAGNOSIS — J324 Chronic pansinusitis: Secondary | ICD-10-CM | POA: Diagnosis not present

## 2020-03-17 DIAGNOSIS — I129 Hypertensive chronic kidney disease with stage 1 through stage 4 chronic kidney disease, or unspecified chronic kidney disease: Secondary | ICD-10-CM | POA: Diagnosis not present

## 2020-03-17 DIAGNOSIS — J45909 Unspecified asthma, uncomplicated: Secondary | ICD-10-CM | POA: Diagnosis not present

## 2020-03-17 DIAGNOSIS — H40052 Ocular hypertension, left eye: Secondary | ICD-10-CM | POA: Diagnosis not present

## 2020-03-17 DIAGNOSIS — E782 Mixed hyperlipidemia: Secondary | ICD-10-CM | POA: Diagnosis not present

## 2020-03-17 DIAGNOSIS — F3341 Major depressive disorder, recurrent, in partial remission: Secondary | ICD-10-CM | POA: Diagnosis not present

## 2020-03-17 DIAGNOSIS — Z466 Encounter for fitting and adjustment of urinary device: Secondary | ICD-10-CM | POA: Diagnosis not present

## 2020-03-17 DIAGNOSIS — G4733 Obstructive sleep apnea (adult) (pediatric): Secondary | ICD-10-CM | POA: Diagnosis not present

## 2020-03-17 DIAGNOSIS — D0421 Carcinoma in situ of skin of right ear and external auricular canal: Secondary | ICD-10-CM | POA: Diagnosis not present

## 2020-03-17 DIAGNOSIS — F419 Anxiety disorder, unspecified: Secondary | ICD-10-CM | POA: Diagnosis not present

## 2020-03-17 DIAGNOSIS — E11319 Type 2 diabetes mellitus with unspecified diabetic retinopathy without macular edema: Secondary | ICD-10-CM | POA: Diagnosis not present

## 2020-03-17 DIAGNOSIS — E1151 Type 2 diabetes mellitus with diabetic peripheral angiopathy without gangrene: Secondary | ICD-10-CM | POA: Diagnosis not present

## 2020-03-17 DIAGNOSIS — E7801 Familial hypercholesterolemia: Secondary | ICD-10-CM | POA: Diagnosis not present

## 2020-03-17 DIAGNOSIS — M9903 Segmental and somatic dysfunction of lumbar region: Secondary | ICD-10-CM | POA: Diagnosis not present

## 2020-03-17 DIAGNOSIS — N2581 Secondary hyperparathyroidism of renal origin: Secondary | ICD-10-CM | POA: Diagnosis not present

## 2020-03-17 DIAGNOSIS — Z9181 History of falling: Secondary | ICD-10-CM | POA: Diagnosis not present

## 2020-03-17 DIAGNOSIS — E1122 Type 2 diabetes mellitus with diabetic chronic kidney disease: Secondary | ICD-10-CM | POA: Diagnosis not present

## 2020-03-17 DIAGNOSIS — G3281 Cerebellar ataxia in diseases classified elsewhere: Secondary | ICD-10-CM | POA: Diagnosis not present

## 2020-03-17 DIAGNOSIS — E538 Deficiency of other specified B group vitamins: Secondary | ICD-10-CM | POA: Diagnosis not present

## 2020-03-17 DIAGNOSIS — Z903 Acquired absence of stomach [part of]: Secondary | ICD-10-CM | POA: Diagnosis not present

## 2020-03-17 DIAGNOSIS — Z96641 Presence of right artificial hip joint: Secondary | ICD-10-CM | POA: Diagnosis not present

## 2020-03-17 DIAGNOSIS — S98112A Complete traumatic amputation of left great toe, initial encounter: Secondary | ICD-10-CM | POA: Diagnosis not present

## 2020-03-17 DIAGNOSIS — E559 Vitamin D deficiency, unspecified: Secondary | ICD-10-CM | POA: Diagnosis not present

## 2020-03-17 DIAGNOSIS — H35033 Hypertensive retinopathy, bilateral: Secondary | ICD-10-CM | POA: Diagnosis not present

## 2020-03-17 DIAGNOSIS — F3342 Major depressive disorder, recurrent, in full remission: Secondary | ICD-10-CM | POA: Diagnosis not present

## 2020-03-17 DIAGNOSIS — R310 Gross hematuria: Secondary | ICD-10-CM | POA: Diagnosis not present

## 2020-03-17 DIAGNOSIS — I479 Paroxysmal tachycardia, unspecified: Secondary | ICD-10-CM | POA: Diagnosis not present

## 2020-03-17 DIAGNOSIS — M1710 Unilateral primary osteoarthritis, unspecified knee: Secondary | ICD-10-CM | POA: Diagnosis not present

## 2020-03-17 DIAGNOSIS — R918 Other nonspecific abnormal finding of lung field: Secondary | ICD-10-CM | POA: Diagnosis not present

## 2020-03-17 DIAGNOSIS — D225 Melanocytic nevi of trunk: Secondary | ICD-10-CM | POA: Diagnosis not present

## 2020-03-17 DIAGNOSIS — E860 Dehydration: Secondary | ICD-10-CM | POA: Diagnosis not present

## 2020-03-17 DIAGNOSIS — R0602 Shortness of breath: Secondary | ICD-10-CM | POA: Diagnosis not present

## 2020-03-17 DIAGNOSIS — M79601 Pain in right arm: Secondary | ICD-10-CM | POA: Diagnosis not present

## 2020-03-17 DIAGNOSIS — I427 Cardiomyopathy due to drug and external agent: Secondary | ICD-10-CM | POA: Diagnosis not present

## 2020-03-17 DIAGNOSIS — K529 Noninfective gastroenteritis and colitis, unspecified: Secondary | ICD-10-CM | POA: Diagnosis not present

## 2020-03-17 DIAGNOSIS — E038 Other specified hypothyroidism: Secondary | ICD-10-CM | POA: Diagnosis not present

## 2020-03-17 DIAGNOSIS — S2232XA Fracture of one rib, left side, initial encounter for closed fracture: Secondary | ICD-10-CM | POA: Diagnosis not present

## 2020-03-17 DIAGNOSIS — Z01812 Encounter for preprocedural laboratory examination: Secondary | ICD-10-CM | POA: Diagnosis not present

## 2020-03-17 DIAGNOSIS — B351 Tinea unguium: Secondary | ICD-10-CM | POA: Diagnosis not present

## 2020-03-17 DIAGNOSIS — L821 Other seborrheic keratosis: Secondary | ICD-10-CM | POA: Diagnosis not present

## 2020-03-17 DIAGNOSIS — H5203 Hypermetropia, bilateral: Secondary | ICD-10-CM | POA: Diagnosis not present

## 2020-03-17 DIAGNOSIS — K311 Adult hypertrophic pyloric stenosis: Secondary | ICD-10-CM | POA: Diagnosis not present

## 2020-03-17 DIAGNOSIS — L931 Subacute cutaneous lupus erythematosus: Secondary | ICD-10-CM | POA: Diagnosis not present

## 2020-03-17 DIAGNOSIS — R928 Other abnormal and inconclusive findings on diagnostic imaging of breast: Secondary | ICD-10-CM | POA: Diagnosis not present

## 2020-03-17 DIAGNOSIS — R7301 Impaired fasting glucose: Secondary | ICD-10-CM | POA: Diagnosis not present

## 2020-03-17 DIAGNOSIS — F319 Bipolar disorder, unspecified: Secondary | ICD-10-CM | POA: Diagnosis not present

## 2020-03-17 DIAGNOSIS — N138 Other obstructive and reflux uropathy: Secondary | ICD-10-CM | POA: Diagnosis not present

## 2020-03-17 DIAGNOSIS — F0391 Unspecified dementia with behavioral disturbance: Secondary | ICD-10-CM | POA: Diagnosis not present

## 2020-03-17 DIAGNOSIS — E7849 Other hyperlipidemia: Secondary | ICD-10-CM | POA: Diagnosis not present

## 2020-03-17 DIAGNOSIS — I4891 Unspecified atrial fibrillation: Secondary | ICD-10-CM | POA: Diagnosis not present

## 2020-03-17 DIAGNOSIS — R062 Wheezing: Secondary | ICD-10-CM | POA: Diagnosis not present

## 2020-03-17 DIAGNOSIS — M25652 Stiffness of left hip, not elsewhere classified: Secondary | ICD-10-CM | POA: Diagnosis not present

## 2020-03-17 DIAGNOSIS — E042 Nontoxic multinodular goiter: Secondary | ICD-10-CM | POA: Diagnosis not present

## 2020-03-17 DIAGNOSIS — L603 Nail dystrophy: Secondary | ICD-10-CM | POA: Diagnosis not present

## 2020-03-17 DIAGNOSIS — M7989 Other specified soft tissue disorders: Secondary | ICD-10-CM | POA: Diagnosis not present

## 2020-03-17 DIAGNOSIS — Z955 Presence of coronary angioplasty implant and graft: Secondary | ICD-10-CM | POA: Diagnosis not present

## 2020-03-17 DIAGNOSIS — N289 Disorder of kidney and ureter, unspecified: Secondary | ICD-10-CM | POA: Diagnosis not present

## 2020-03-17 DIAGNOSIS — M5442 Lumbago with sciatica, left side: Secondary | ICD-10-CM | POA: Diagnosis not present

## 2020-03-17 DIAGNOSIS — H4312 Vitreous hemorrhage, left eye: Secondary | ICD-10-CM | POA: Diagnosis not present

## 2020-03-17 DIAGNOSIS — J189 Pneumonia, unspecified organism: Secondary | ICD-10-CM | POA: Diagnosis not present

## 2020-03-17 DIAGNOSIS — M25661 Stiffness of right knee, not elsewhere classified: Secondary | ICD-10-CM | POA: Diagnosis not present

## 2020-03-17 DIAGNOSIS — K7689 Other specified diseases of liver: Secondary | ICD-10-CM | POA: Diagnosis not present

## 2020-03-17 DIAGNOSIS — M75101 Unspecified rotator cuff tear or rupture of right shoulder, not specified as traumatic: Secondary | ICD-10-CM | POA: Diagnosis not present

## 2020-03-17 DIAGNOSIS — Z950 Presence of cardiac pacemaker: Secondary | ICD-10-CM | POA: Diagnosis not present

## 2020-03-17 DIAGNOSIS — Z853 Personal history of malignant neoplasm of breast: Secondary | ICD-10-CM | POA: Diagnosis not present

## 2020-03-17 DIAGNOSIS — E1022 Type 1 diabetes mellitus with diabetic chronic kidney disease: Secondary | ICD-10-CM | POA: Diagnosis not present

## 2020-03-17 DIAGNOSIS — H53462 Homonymous bilateral field defects, left side: Secondary | ICD-10-CM | POA: Diagnosis not present

## 2020-03-17 DIAGNOSIS — F418 Other specified anxiety disorders: Secondary | ICD-10-CM | POA: Diagnosis not present

## 2020-03-17 DIAGNOSIS — H2012 Chronic iridocyclitis, left eye: Secondary | ICD-10-CM | POA: Diagnosis not present

## 2020-03-17 DIAGNOSIS — K635 Polyp of colon: Secondary | ICD-10-CM | POA: Diagnosis not present

## 2020-03-17 DIAGNOSIS — H9201 Otalgia, right ear: Secondary | ICD-10-CM | POA: Diagnosis not present

## 2020-03-17 DIAGNOSIS — G4700 Insomnia, unspecified: Secondary | ICD-10-CM | POA: Diagnosis not present

## 2020-03-17 DIAGNOSIS — I5043 Acute on chronic combined systolic (congestive) and diastolic (congestive) heart failure: Secondary | ICD-10-CM | POA: Diagnosis not present

## 2020-03-17 DIAGNOSIS — D689 Coagulation defect, unspecified: Secondary | ICD-10-CM | POA: Diagnosis not present

## 2020-03-17 DIAGNOSIS — Z8546 Personal history of malignant neoplasm of prostate: Secondary | ICD-10-CM | POA: Diagnosis not present

## 2020-03-17 DIAGNOSIS — M792 Neuralgia and neuritis, unspecified: Secondary | ICD-10-CM | POA: Diagnosis not present

## 2020-03-17 DIAGNOSIS — D473 Essential (hemorrhagic) thrombocythemia: Secondary | ICD-10-CM | POA: Diagnosis not present

## 2020-03-17 DIAGNOSIS — M629 Disorder of muscle, unspecified: Secondary | ICD-10-CM | POA: Diagnosis not present

## 2020-03-17 DIAGNOSIS — N185 Chronic kidney disease, stage 5: Secondary | ICD-10-CM | POA: Diagnosis not present

## 2020-03-17 DIAGNOSIS — L814 Other melanin hyperpigmentation: Secondary | ICD-10-CM | POA: Diagnosis not present

## 2020-03-17 DIAGNOSIS — M21372 Foot drop, left foot: Secondary | ICD-10-CM | POA: Diagnosis not present

## 2020-03-17 DIAGNOSIS — H401223 Low-tension glaucoma, left eye, severe stage: Secondary | ICD-10-CM | POA: Diagnosis not present

## 2020-03-17 DIAGNOSIS — J3489 Other specified disorders of nose and nasal sinuses: Secondary | ICD-10-CM | POA: Diagnosis not present

## 2020-03-17 DIAGNOSIS — L309 Dermatitis, unspecified: Secondary | ICD-10-CM | POA: Diagnosis not present

## 2020-03-17 DIAGNOSIS — D631 Anemia in chronic kidney disease: Secondary | ICD-10-CM | POA: Diagnosis not present

## 2020-03-17 DIAGNOSIS — I714 Abdominal aortic aneurysm, without rupture: Secondary | ICD-10-CM | POA: Diagnosis not present

## 2020-03-17 DIAGNOSIS — D649 Anemia, unspecified: Secondary | ICD-10-CM | POA: Diagnosis not present

## 2020-03-17 DIAGNOSIS — Z6821 Body mass index (BMI) 21.0-21.9, adult: Secondary | ICD-10-CM | POA: Diagnosis not present

## 2020-03-17 DIAGNOSIS — G309 Alzheimer's disease, unspecified: Secondary | ICD-10-CM | POA: Diagnosis not present

## 2020-03-17 DIAGNOSIS — H35073 Retinal telangiectasis, bilateral: Secondary | ICD-10-CM | POA: Diagnosis not present

## 2020-03-17 DIAGNOSIS — H401133 Primary open-angle glaucoma, bilateral, severe stage: Secondary | ICD-10-CM | POA: Diagnosis not present

## 2020-03-17 DIAGNOSIS — R188 Other ascites: Secondary | ICD-10-CM | POA: Diagnosis not present

## 2020-03-17 DIAGNOSIS — Z01818 Encounter for other preprocedural examination: Secondary | ICD-10-CM | POA: Diagnosis not present

## 2020-03-17 DIAGNOSIS — N939 Abnormal uterine and vaginal bleeding, unspecified: Secondary | ICD-10-CM | POA: Diagnosis not present

## 2020-03-17 DIAGNOSIS — R52 Pain, unspecified: Secondary | ICD-10-CM | POA: Diagnosis not present

## 2020-03-17 DIAGNOSIS — C9000 Multiple myeloma not having achieved remission: Secondary | ICD-10-CM | POA: Diagnosis not present

## 2020-03-17 DIAGNOSIS — D1801 Hemangioma of skin and subcutaneous tissue: Secondary | ICD-10-CM | POA: Diagnosis not present

## 2020-03-17 DIAGNOSIS — N141 Nephropathy induced by other drugs, medicaments and biological substances: Secondary | ICD-10-CM | POA: Diagnosis not present

## 2020-03-17 DIAGNOSIS — M48062 Spinal stenosis, lumbar region with neurogenic claudication: Secondary | ICD-10-CM | POA: Diagnosis not present

## 2020-03-17 DIAGNOSIS — E2740 Unspecified adrenocortical insufficiency: Secondary | ICD-10-CM | POA: Diagnosis not present

## 2020-03-17 DIAGNOSIS — M2041 Other hammer toe(s) (acquired), right foot: Secondary | ICD-10-CM | POA: Diagnosis not present

## 2020-03-17 DIAGNOSIS — Z952 Presence of prosthetic heart valve: Secondary | ICD-10-CM | POA: Diagnosis not present

## 2020-03-17 DIAGNOSIS — L03032 Cellulitis of left toe: Secondary | ICD-10-CM | POA: Diagnosis not present

## 2020-03-17 DIAGNOSIS — D075 Carcinoma in situ of prostate: Secondary | ICD-10-CM | POA: Diagnosis not present

## 2020-03-17 DIAGNOSIS — I453 Trifascicular block: Secondary | ICD-10-CM | POA: Diagnosis not present

## 2020-03-17 DIAGNOSIS — K589 Irritable bowel syndrome without diarrhea: Secondary | ICD-10-CM | POA: Diagnosis not present

## 2020-03-17 DIAGNOSIS — H9311 Tinnitus, right ear: Secondary | ICD-10-CM | POA: Diagnosis not present

## 2020-03-17 DIAGNOSIS — M7121 Synovial cyst of popliteal space [Baker], right knee: Secondary | ICD-10-CM | POA: Diagnosis not present

## 2020-03-17 DIAGNOSIS — M79675 Pain in left toe(s): Secondary | ICD-10-CM | POA: Diagnosis not present

## 2020-03-17 DIAGNOSIS — K2930 Chronic superficial gastritis without bleeding: Secondary | ICD-10-CM | POA: Diagnosis not present

## 2020-03-17 DIAGNOSIS — N529 Male erectile dysfunction, unspecified: Secondary | ICD-10-CM | POA: Diagnosis not present

## 2020-03-17 DIAGNOSIS — F0150 Vascular dementia without behavioral disturbance: Secondary | ICD-10-CM | POA: Diagnosis not present

## 2020-03-17 DIAGNOSIS — E1121 Type 2 diabetes mellitus with diabetic nephropathy: Secondary | ICD-10-CM | POA: Diagnosis not present

## 2020-03-17 DIAGNOSIS — I255 Ischemic cardiomyopathy: Secondary | ICD-10-CM | POA: Diagnosis not present

## 2020-03-17 DIAGNOSIS — B002 Herpesviral gingivostomatitis and pharyngotonsillitis: Secondary | ICD-10-CM | POA: Diagnosis not present

## 2020-03-17 DIAGNOSIS — Z139 Encounter for screening, unspecified: Secondary | ICD-10-CM | POA: Diagnosis not present

## 2020-03-17 DIAGNOSIS — G4709 Other insomnia: Secondary | ICD-10-CM | POA: Diagnosis not present

## 2020-03-17 DIAGNOSIS — N6002 Solitary cyst of left breast: Secondary | ICD-10-CM | POA: Diagnosis not present

## 2020-03-17 DIAGNOSIS — W5911XD Bitten by nonvenomous snake, subsequent encounter: Secondary | ICD-10-CM | POA: Diagnosis not present

## 2020-03-17 DIAGNOSIS — M519 Unspecified thoracic, thoracolumbar and lumbosacral intervertebral disc disorder: Secondary | ICD-10-CM | POA: Diagnosis not present

## 2020-03-17 DIAGNOSIS — E1169 Type 2 diabetes mellitus with other specified complication: Secondary | ICD-10-CM | POA: Diagnosis not present

## 2020-03-17 DIAGNOSIS — M7062 Trochanteric bursitis, left hip: Secondary | ICD-10-CM | POA: Diagnosis not present

## 2020-03-17 DIAGNOSIS — E1140 Type 2 diabetes mellitus with diabetic neuropathy, unspecified: Secondary | ICD-10-CM | POA: Diagnosis not present

## 2020-03-17 DIAGNOSIS — H5371 Glare sensitivity: Secondary | ICD-10-CM | POA: Diagnosis not present

## 2020-03-17 DIAGNOSIS — M4317 Spondylolisthesis, lumbosacral region: Secondary | ICD-10-CM | POA: Diagnosis not present

## 2020-03-17 DIAGNOSIS — S0096XA Insect bite (nonvenomous) of unspecified part of head, initial encounter: Secondary | ICD-10-CM | POA: Diagnosis not present

## 2020-03-17 DIAGNOSIS — J15212 Pneumonia due to Methicillin resistant Staphylococcus aureus: Secondary | ICD-10-CM | POA: Diagnosis not present

## 2020-03-17 DIAGNOSIS — M205X1 Other deformities of toe(s) (acquired), right foot: Secondary | ICD-10-CM | POA: Diagnosis not present

## 2020-03-17 DIAGNOSIS — S42212A Unspecified displaced fracture of surgical neck of left humerus, initial encounter for closed fracture: Secondary | ICD-10-CM | POA: Diagnosis not present

## 2020-03-17 DIAGNOSIS — R946 Abnormal results of thyroid function studies: Secondary | ICD-10-CM | POA: Diagnosis not present

## 2020-03-17 DIAGNOSIS — R948 Abnormal results of function studies of other organs and systems: Secondary | ICD-10-CM | POA: Diagnosis not present

## 2020-03-17 DIAGNOSIS — X32XXXA Exposure to sunlight, initial encounter: Secondary | ICD-10-CM | POA: Diagnosis not present

## 2020-03-17 DIAGNOSIS — M069 Rheumatoid arthritis, unspecified: Secondary | ICD-10-CM | POA: Diagnosis not present

## 2020-03-17 DIAGNOSIS — H43393 Other vitreous opacities, bilateral: Secondary | ICD-10-CM | POA: Diagnosis not present

## 2020-03-17 DIAGNOSIS — J439 Emphysema, unspecified: Secondary | ICD-10-CM | POA: Diagnosis not present

## 2020-03-17 DIAGNOSIS — M216X9 Other acquired deformities of unspecified foot: Secondary | ICD-10-CM | POA: Diagnosis not present

## 2020-03-17 DIAGNOSIS — R0600 Dyspnea, unspecified: Secondary | ICD-10-CM | POA: Diagnosis not present

## 2020-03-17 DIAGNOSIS — Z6825 Body mass index (BMI) 25.0-25.9, adult: Secondary | ICD-10-CM | POA: Diagnosis not present

## 2020-03-17 DIAGNOSIS — Z79899 Other long term (current) drug therapy: Secondary | ICD-10-CM | POA: Diagnosis not present

## 2020-03-17 DIAGNOSIS — H60393 Other infective otitis externa, bilateral: Secondary | ICD-10-CM | POA: Diagnosis not present

## 2020-03-17 DIAGNOSIS — G479 Sleep disorder, unspecified: Secondary | ICD-10-CM | POA: Diagnosis not present

## 2020-03-17 DIAGNOSIS — I517 Cardiomegaly: Secondary | ICD-10-CM | POA: Diagnosis not present

## 2020-03-17 DIAGNOSIS — T148XXA Other injury of unspecified body region, initial encounter: Secondary | ICD-10-CM | POA: Diagnosis not present

## 2020-03-17 DIAGNOSIS — N201 Calculus of ureter: Secondary | ICD-10-CM | POA: Diagnosis not present

## 2020-03-17 DIAGNOSIS — Z96651 Presence of right artificial knee joint: Secondary | ICD-10-CM | POA: Diagnosis not present

## 2020-03-17 DIAGNOSIS — D485 Neoplasm of uncertain behavior of skin: Secondary | ICD-10-CM | POA: Diagnosis not present

## 2020-03-17 DIAGNOSIS — T466X5A Adverse effect of antihyperlipidemic and antiarteriosclerotic drugs, initial encounter: Secondary | ICD-10-CM | POA: Diagnosis not present

## 2020-03-17 DIAGNOSIS — N401 Enlarged prostate with lower urinary tract symptoms: Secondary | ICD-10-CM | POA: Diagnosis not present

## 2020-03-17 DIAGNOSIS — H26491 Other secondary cataract, right eye: Secondary | ICD-10-CM | POA: Diagnosis not present

## 2020-03-17 DIAGNOSIS — H0288A Meibomian gland dysfunction right eye, upper and lower eyelids: Secondary | ICD-10-CM | POA: Diagnosis not present

## 2020-03-17 DIAGNOSIS — R55 Syncope and collapse: Secondary | ICD-10-CM | POA: Diagnosis not present

## 2020-03-17 DIAGNOSIS — Z636 Dependent relative needing care at home: Secondary | ICD-10-CM | POA: Diagnosis not present

## 2020-03-17 DIAGNOSIS — G8110 Spastic hemiplegia affecting unspecified side: Secondary | ICD-10-CM | POA: Diagnosis not present

## 2020-03-17 DIAGNOSIS — M2062 Acquired deformities of toe(s), unspecified, left foot: Secondary | ICD-10-CM | POA: Diagnosis not present

## 2020-03-17 DIAGNOSIS — L02619 Cutaneous abscess of unspecified foot: Secondary | ICD-10-CM | POA: Diagnosis not present

## 2020-03-17 DIAGNOSIS — I361 Nonrheumatic tricuspid (valve) insufficiency: Secondary | ICD-10-CM | POA: Diagnosis not present

## 2020-03-17 DIAGNOSIS — G894 Chronic pain syndrome: Secondary | ICD-10-CM | POA: Diagnosis not present

## 2020-03-17 DIAGNOSIS — M1612 Unilateral primary osteoarthritis, left hip: Secondary | ICD-10-CM | POA: Diagnosis not present

## 2020-03-17 DIAGNOSIS — T691XXD Chilblains, subsequent encounter: Secondary | ICD-10-CM | POA: Diagnosis not present

## 2020-03-17 DIAGNOSIS — M109 Gout, unspecified: Secondary | ICD-10-CM | POA: Diagnosis not present

## 2020-03-17 DIAGNOSIS — Z6826 Body mass index (BMI) 26.0-26.9, adult: Secondary | ICD-10-CM | POA: Diagnosis not present

## 2020-03-17 DIAGNOSIS — C880 Waldenstrom macroglobulinemia: Secondary | ICD-10-CM | POA: Diagnosis not present

## 2020-03-17 DIAGNOSIS — I119 Hypertensive heart disease without heart failure: Secondary | ICD-10-CM | POA: Diagnosis not present

## 2020-03-17 DIAGNOSIS — R278 Other lack of coordination: Secondary | ICD-10-CM | POA: Diagnosis not present

## 2020-03-17 DIAGNOSIS — M79602 Pain in left arm: Secondary | ICD-10-CM | POA: Diagnosis not present

## 2020-03-17 DIAGNOSIS — Z6830 Body mass index (BMI) 30.0-30.9, adult: Secondary | ICD-10-CM | POA: Diagnosis not present

## 2020-03-17 DIAGNOSIS — M7918 Myalgia, other site: Secondary | ICD-10-CM | POA: Diagnosis not present

## 2020-03-17 DIAGNOSIS — M353 Polymyalgia rheumatica: Secondary | ICD-10-CM | POA: Diagnosis not present

## 2020-03-17 DIAGNOSIS — F99 Mental disorder, not otherwise specified: Secondary | ICD-10-CM | POA: Diagnosis not present

## 2020-03-17 DIAGNOSIS — R5381 Other malaise: Secondary | ICD-10-CM | POA: Diagnosis not present

## 2020-03-17 DIAGNOSIS — R413 Other amnesia: Secondary | ICD-10-CM | POA: Diagnosis not present

## 2020-03-17 DIAGNOSIS — K319 Disease of stomach and duodenum, unspecified: Secondary | ICD-10-CM | POA: Diagnosis not present

## 2020-03-17 DIAGNOSIS — M7742 Metatarsalgia, left foot: Secondary | ICD-10-CM | POA: Diagnosis not present

## 2020-03-17 DIAGNOSIS — H401111 Primary open-angle glaucoma, right eye, mild stage: Secondary | ICD-10-CM | POA: Diagnosis not present

## 2020-03-17 DIAGNOSIS — R102 Pelvic and perineal pain: Secondary | ICD-10-CM | POA: Diagnosis not present

## 2020-03-17 DIAGNOSIS — S52532A Colles' fracture of left radius, initial encounter for closed fracture: Secondary | ICD-10-CM | POA: Diagnosis not present

## 2020-03-17 DIAGNOSIS — G43109 Migraine with aura, not intractable, without status migrainosus: Secondary | ICD-10-CM | POA: Diagnosis not present

## 2020-03-17 DIAGNOSIS — I152 Hypertension secondary to endocrine disorders: Secondary | ICD-10-CM | POA: Diagnosis not present

## 2020-03-17 DIAGNOSIS — M7061 Trochanteric bursitis, right hip: Secondary | ICD-10-CM | POA: Diagnosis not present

## 2020-03-17 DIAGNOSIS — H3581 Retinal edema: Secondary | ICD-10-CM | POA: Diagnosis not present

## 2020-03-17 DIAGNOSIS — J069 Acute upper respiratory infection, unspecified: Secondary | ICD-10-CM | POA: Diagnosis not present

## 2020-03-17 DIAGNOSIS — M79672 Pain in left foot: Secondary | ICD-10-CM | POA: Diagnosis not present

## 2020-03-17 DIAGNOSIS — M7551 Bursitis of right shoulder: Secondary | ICD-10-CM | POA: Diagnosis not present

## 2020-03-17 DIAGNOSIS — A419 Sepsis, unspecified organism: Secondary | ICD-10-CM | POA: Diagnosis not present

## 2020-03-17 DIAGNOSIS — R5383 Other fatigue: Secondary | ICD-10-CM | POA: Diagnosis not present

## 2020-03-17 DIAGNOSIS — F5104 Psychophysiologic insomnia: Secondary | ICD-10-CM | POA: Diagnosis not present

## 2020-03-17 DIAGNOSIS — D7589 Other specified diseases of blood and blood-forming organs: Secondary | ICD-10-CM | POA: Diagnosis not present

## 2020-03-17 DIAGNOSIS — M810 Age-related osteoporosis without current pathological fracture: Secondary | ICD-10-CM | POA: Diagnosis not present

## 2020-03-17 DIAGNOSIS — E039 Hypothyroidism, unspecified: Secondary | ICD-10-CM | POA: Diagnosis not present

## 2020-03-17 DIAGNOSIS — H548 Legal blindness, as defined in USA: Secondary | ICD-10-CM | POA: Diagnosis not present

## 2020-03-17 DIAGNOSIS — R270 Ataxia, unspecified: Secondary | ICD-10-CM | POA: Diagnosis not present

## 2020-03-17 DIAGNOSIS — K649 Unspecified hemorrhoids: Secondary | ICD-10-CM | POA: Diagnosis not present

## 2020-03-17 DIAGNOSIS — Z6831 Body mass index (BMI) 31.0-31.9, adult: Secondary | ICD-10-CM | POA: Diagnosis not present

## 2020-03-17 DIAGNOSIS — C7800 Secondary malignant neoplasm of unspecified lung: Secondary | ICD-10-CM | POA: Diagnosis not present

## 2020-03-17 DIAGNOSIS — M5441 Lumbago with sciatica, right side: Secondary | ICD-10-CM | POA: Diagnosis not present

## 2020-03-17 DIAGNOSIS — R269 Unspecified abnormalities of gait and mobility: Secondary | ICD-10-CM | POA: Diagnosis not present

## 2020-03-17 DIAGNOSIS — I6523 Occlusion and stenosis of bilateral carotid arteries: Secondary | ICD-10-CM | POA: Diagnosis not present

## 2020-03-17 DIAGNOSIS — I77819 Aortic ectasia, unspecified site: Secondary | ICD-10-CM | POA: Diagnosis not present

## 2020-03-17 DIAGNOSIS — Z7984 Long term (current) use of oral hypoglycemic drugs: Secondary | ICD-10-CM | POA: Diagnosis not present

## 2020-03-17 DIAGNOSIS — I739 Peripheral vascular disease, unspecified: Secondary | ICD-10-CM | POA: Diagnosis not present

## 2020-03-17 DIAGNOSIS — S86911A Strain of unspecified muscle(s) and tendon(s) at lower leg level, right leg, initial encounter: Secondary | ICD-10-CM | POA: Diagnosis not present

## 2020-03-17 DIAGNOSIS — L97522 Non-pressure chronic ulcer of other part of left foot with fat layer exposed: Secondary | ICD-10-CM | POA: Diagnosis not present

## 2020-03-17 DIAGNOSIS — N179 Acute kidney failure, unspecified: Secondary | ICD-10-CM | POA: Diagnosis not present

## 2020-03-17 DIAGNOSIS — M549 Dorsalgia, unspecified: Secondary | ICD-10-CM | POA: Diagnosis not present

## 2020-03-17 DIAGNOSIS — R911 Solitary pulmonary nodule: Secondary | ICD-10-CM | POA: Diagnosis not present

## 2020-03-17 DIAGNOSIS — I5030 Unspecified diastolic (congestive) heart failure: Secondary | ICD-10-CM | POA: Diagnosis not present

## 2020-03-17 DIAGNOSIS — E789 Disorder of lipoprotein metabolism, unspecified: Secondary | ICD-10-CM | POA: Diagnosis not present

## 2020-03-17 DIAGNOSIS — M25531 Pain in right wrist: Secondary | ICD-10-CM | POA: Diagnosis not present

## 2020-03-17 DIAGNOSIS — S79922A Unspecified injury of left thigh, initial encounter: Secondary | ICD-10-CM | POA: Diagnosis not present

## 2020-03-17 DIAGNOSIS — I420 Dilated cardiomyopathy: Secondary | ICD-10-CM | POA: Diagnosis not present

## 2020-03-17 DIAGNOSIS — E063 Autoimmune thyroiditis: Secondary | ICD-10-CM | POA: Diagnosis not present

## 2020-03-17 DIAGNOSIS — H4302 Vitreous prolapse, left eye: Secondary | ICD-10-CM | POA: Diagnosis not present

## 2020-03-17 DIAGNOSIS — G5603 Carpal tunnel syndrome, bilateral upper limbs: Secondary | ICD-10-CM | POA: Diagnosis not present

## 2020-03-17 DIAGNOSIS — L03116 Cellulitis of left lower limb: Secondary | ICD-10-CM | POA: Diagnosis not present

## 2020-03-17 DIAGNOSIS — H9190 Unspecified hearing loss, unspecified ear: Secondary | ICD-10-CM | POA: Diagnosis not present

## 2020-03-17 DIAGNOSIS — Z8639 Personal history of other endocrine, nutritional and metabolic disease: Secondary | ICD-10-CM | POA: Diagnosis not present

## 2020-03-17 DIAGNOSIS — R6520 Severe sepsis without septic shock: Secondary | ICD-10-CM | POA: Diagnosis not present

## 2020-03-17 DIAGNOSIS — N888 Other specified noninflammatory disorders of cervix uteri: Secondary | ICD-10-CM | POA: Diagnosis not present

## 2020-03-17 DIAGNOSIS — Z45018 Encounter for adjustment and management of other part of cardiac pacemaker: Secondary | ICD-10-CM | POA: Diagnosis not present

## 2020-03-17 DIAGNOSIS — E539 Vitamin B deficiency, unspecified: Secondary | ICD-10-CM | POA: Diagnosis not present

## 2020-03-17 DIAGNOSIS — Z125 Encounter for screening for malignant neoplasm of prostate: Secondary | ICD-10-CM | POA: Diagnosis not present

## 2020-03-17 DIAGNOSIS — J9601 Acute respiratory failure with hypoxia: Secondary | ICD-10-CM | POA: Diagnosis not present

## 2020-03-17 DIAGNOSIS — J431 Panlobular emphysema: Secondary | ICD-10-CM | POA: Diagnosis not present

## 2020-03-17 DIAGNOSIS — R296 Repeated falls: Secondary | ICD-10-CM | POA: Diagnosis not present

## 2020-03-17 DIAGNOSIS — K5900 Constipation, unspecified: Secondary | ICD-10-CM | POA: Diagnosis not present

## 2020-03-17 DIAGNOSIS — F329 Major depressive disorder, single episode, unspecified: Secondary | ICD-10-CM | POA: Diagnosis not present

## 2020-03-17 DIAGNOSIS — H04123 Dry eye syndrome of bilateral lacrimal glands: Secondary | ICD-10-CM | POA: Diagnosis not present

## 2020-03-17 DIAGNOSIS — I208 Other forms of angina pectoris: Secondary | ICD-10-CM | POA: Diagnosis not present

## 2020-03-17 DIAGNOSIS — N170 Acute kidney failure with tubular necrosis: Secondary | ICD-10-CM | POA: Diagnosis not present

## 2020-03-17 DIAGNOSIS — Z961 Presence of intraocular lens: Secondary | ICD-10-CM | POA: Diagnosis not present

## 2020-03-17 DIAGNOSIS — N183 Chronic kidney disease, stage 3 unspecified: Secondary | ICD-10-CM | POA: Diagnosis not present

## 2020-03-17 DIAGNOSIS — Z1331 Encounter for screening for depression: Secondary | ICD-10-CM | POA: Diagnosis not present

## 2020-03-17 DIAGNOSIS — C787 Secondary malignant neoplasm of liver and intrahepatic bile duct: Secondary | ICD-10-CM | POA: Diagnosis not present

## 2020-03-17 DIAGNOSIS — J8410 Pulmonary fibrosis, unspecified: Secondary | ICD-10-CM | POA: Diagnosis not present

## 2020-03-17 DIAGNOSIS — R7989 Other specified abnormal findings of blood chemistry: Secondary | ICD-10-CM | POA: Diagnosis not present

## 2020-03-17 DIAGNOSIS — T451X5A Adverse effect of antineoplastic and immunosuppressive drugs, initial encounter: Secondary | ICD-10-CM | POA: Diagnosis not present

## 2020-03-17 DIAGNOSIS — E875 Hyperkalemia: Secondary | ICD-10-CM | POA: Diagnosis not present

## 2020-03-17 DIAGNOSIS — I5042 Chronic combined systolic (congestive) and diastolic (congestive) heart failure: Secondary | ICD-10-CM | POA: Diagnosis not present

## 2020-03-17 DIAGNOSIS — J4520 Mild intermittent asthma, uncomplicated: Secondary | ICD-10-CM | POA: Diagnosis not present

## 2020-03-17 DIAGNOSIS — R1310 Dysphagia, unspecified: Secondary | ICD-10-CM | POA: Diagnosis not present

## 2020-03-17 DIAGNOSIS — M21611 Bunion of right foot: Secondary | ICD-10-CM | POA: Diagnosis not present

## 2020-03-17 DIAGNOSIS — E034 Atrophy of thyroid (acquired): Secondary | ICD-10-CM | POA: Diagnosis not present

## 2020-03-17 DIAGNOSIS — E11622 Type 2 diabetes mellitus with other skin ulcer: Secondary | ICD-10-CM | POA: Diagnosis not present

## 2020-03-17 DIAGNOSIS — Z1152 Encounter for screening for COVID-19: Secondary | ICD-10-CM | POA: Diagnosis not present

## 2020-03-17 DIAGNOSIS — J9611 Chronic respiratory failure with hypoxia: Secondary | ICD-10-CM | POA: Diagnosis not present

## 2020-03-17 DIAGNOSIS — E1165 Type 2 diabetes mellitus with hyperglycemia: Secondary | ICD-10-CM | POA: Diagnosis not present

## 2020-03-17 DIAGNOSIS — M71341 Other bursal cyst, right hand: Secondary | ICD-10-CM | POA: Diagnosis not present

## 2020-03-17 DIAGNOSIS — R933 Abnormal findings on diagnostic imaging of other parts of digestive tract: Secondary | ICD-10-CM | POA: Diagnosis not present

## 2020-03-17 DIAGNOSIS — Z8249 Family history of ischemic heart disease and other diseases of the circulatory system: Secondary | ICD-10-CM | POA: Diagnosis not present

## 2020-03-17 DIAGNOSIS — J9610 Chronic respiratory failure, unspecified whether with hypoxia or hypercapnia: Secondary | ICD-10-CM | POA: Diagnosis not present

## 2020-03-17 DIAGNOSIS — H25813 Combined forms of age-related cataract, bilateral: Secondary | ICD-10-CM | POA: Diagnosis not present

## 2020-03-17 DIAGNOSIS — H20021 Recurrent acute iridocyclitis, right eye: Secondary | ICD-10-CM | POA: Diagnosis not present

## 2020-03-17 DIAGNOSIS — R82998 Other abnormal findings in urine: Secondary | ICD-10-CM | POA: Diagnosis not present

## 2020-03-17 DIAGNOSIS — F0390 Unspecified dementia without behavioral disturbance: Secondary | ICD-10-CM | POA: Diagnosis not present

## 2020-03-17 DIAGNOSIS — Z9889 Other specified postprocedural states: Secondary | ICD-10-CM | POA: Diagnosis not present

## 2020-03-17 DIAGNOSIS — L989 Disorder of the skin and subcutaneous tissue, unspecified: Secondary | ICD-10-CM | POA: Diagnosis not present

## 2020-03-17 DIAGNOSIS — M79674 Pain in right toe(s): Secondary | ICD-10-CM | POA: Diagnosis not present

## 2020-03-17 DIAGNOSIS — G301 Alzheimer's disease with late onset: Secondary | ICD-10-CM | POA: Diagnosis not present

## 2020-03-17 DIAGNOSIS — F4321 Adjustment disorder with depressed mood: Secondary | ICD-10-CM | POA: Diagnosis not present

## 2020-03-17 DIAGNOSIS — N1831 Chronic kidney disease, stage 3a: Secondary | ICD-10-CM | POA: Diagnosis not present

## 2020-03-17 DIAGNOSIS — C250 Malignant neoplasm of head of pancreas: Secondary | ICD-10-CM | POA: Diagnosis not present

## 2020-03-17 DIAGNOSIS — Z7189 Other specified counseling: Secondary | ICD-10-CM | POA: Diagnosis not present

## 2020-03-17 DIAGNOSIS — L0390 Cellulitis, unspecified: Secondary | ICD-10-CM | POA: Diagnosis not present

## 2020-03-17 DIAGNOSIS — R262 Difficulty in walking, not elsewhere classified: Secondary | ICD-10-CM | POA: Diagnosis not present

## 2020-03-17 DIAGNOSIS — K828 Other specified diseases of gallbladder: Secondary | ICD-10-CM | POA: Diagnosis not present

## 2020-03-17 DIAGNOSIS — D2371 Other benign neoplasm of skin of right lower limb, including hip: Secondary | ICD-10-CM | POA: Diagnosis not present

## 2020-03-17 DIAGNOSIS — N182 Chronic kidney disease, stage 2 (mild): Secondary | ICD-10-CM | POA: Diagnosis not present

## 2020-03-17 DIAGNOSIS — J309 Allergic rhinitis, unspecified: Secondary | ICD-10-CM | POA: Diagnosis not present

## 2020-03-17 DIAGNOSIS — H401123 Primary open-angle glaucoma, left eye, severe stage: Secondary | ICD-10-CM | POA: Diagnosis not present

## 2020-03-17 DIAGNOSIS — M779 Enthesopathy, unspecified: Secondary | ICD-10-CM | POA: Diagnosis not present

## 2020-03-17 DIAGNOSIS — N83202 Unspecified ovarian cyst, left side: Secondary | ICD-10-CM | POA: Diagnosis not present

## 2020-03-17 DIAGNOSIS — G4710 Hypersomnia, unspecified: Secondary | ICD-10-CM | POA: Diagnosis not present

## 2020-03-17 DIAGNOSIS — L218 Other seborrheic dermatitis: Secondary | ICD-10-CM | POA: Diagnosis not present

## 2020-03-17 DIAGNOSIS — G9340 Encephalopathy, unspecified: Secondary | ICD-10-CM | POA: Diagnosis not present

## 2020-03-17 DIAGNOSIS — I472 Ventricular tachycardia: Secondary | ICD-10-CM | POA: Diagnosis not present

## 2020-03-17 DIAGNOSIS — Z1231 Encounter for screening mammogram for malignant neoplasm of breast: Secondary | ICD-10-CM | POA: Diagnosis not present

## 2020-03-17 DIAGNOSIS — S76011D Strain of muscle, fascia and tendon of right hip, subsequent encounter: Secondary | ICD-10-CM | POA: Diagnosis not present

## 2020-03-17 DIAGNOSIS — R42 Dizziness and giddiness: Secondary | ICD-10-CM | POA: Diagnosis not present

## 2020-03-17 DIAGNOSIS — Z712 Person consulting for explanation of examination or test findings: Secondary | ICD-10-CM | POA: Diagnosis not present

## 2020-03-17 DIAGNOSIS — N951 Menopausal and female climacteric states: Secondary | ICD-10-CM | POA: Diagnosis not present

## 2020-03-17 DIAGNOSIS — D519 Vitamin B12 deficiency anemia, unspecified: Secondary | ICD-10-CM | POA: Diagnosis not present

## 2020-03-17 DIAGNOSIS — M791 Myalgia, unspecified site: Secondary | ICD-10-CM | POA: Diagnosis not present

## 2020-03-17 DIAGNOSIS — H40003 Preglaucoma, unspecified, bilateral: Secondary | ICD-10-CM | POA: Diagnosis not present

## 2020-03-17 DIAGNOSIS — Z87891 Personal history of nicotine dependence: Secondary | ICD-10-CM | POA: Diagnosis not present

## 2020-03-17 DIAGNOSIS — H2102 Hyphema, left eye: Secondary | ICD-10-CM | POA: Diagnosis not present

## 2020-03-17 DIAGNOSIS — E785 Hyperlipidemia, unspecified: Secondary | ICD-10-CM | POA: Diagnosis not present

## 2020-03-17 DIAGNOSIS — N400 Enlarged prostate without lower urinary tract symptoms: Secondary | ICD-10-CM | POA: Diagnosis not present

## 2020-03-17 DIAGNOSIS — H40013 Open angle with borderline findings, low risk, bilateral: Secondary | ICD-10-CM | POA: Diagnosis not present

## 2020-03-17 DIAGNOSIS — I495 Sick sinus syndrome: Secondary | ICD-10-CM | POA: Diagnosis not present

## 2020-03-17 DIAGNOSIS — E2839 Other primary ovarian failure: Secondary | ICD-10-CM | POA: Diagnosis not present

## 2020-03-17 DIAGNOSIS — F4323 Adjustment disorder with mixed anxiety and depressed mood: Secondary | ICD-10-CM | POA: Diagnosis not present

## 2020-03-17 DIAGNOSIS — I674 Hypertensive encephalopathy: Secondary | ICD-10-CM | POA: Diagnosis not present

## 2020-03-17 DIAGNOSIS — M9904 Segmental and somatic dysfunction of sacral region: Secondary | ICD-10-CM | POA: Diagnosis not present

## 2020-03-17 DIAGNOSIS — J449 Chronic obstructive pulmonary disease, unspecified: Secondary | ICD-10-CM | POA: Diagnosis not present

## 2020-03-17 DIAGNOSIS — M329 Systemic lupus erythematosus, unspecified: Secondary | ICD-10-CM | POA: Diagnosis not present

## 2020-03-17 DIAGNOSIS — R0989 Other specified symptoms and signs involving the circulatory and respiratory systems: Secondary | ICD-10-CM | POA: Diagnosis not present

## 2020-03-17 DIAGNOSIS — E1159 Type 2 diabetes mellitus with other circulatory complications: Secondary | ICD-10-CM | POA: Diagnosis not present

## 2020-03-17 DIAGNOSIS — I63139 Cerebral infarction due to embolism of unspecified carotid artery: Secondary | ICD-10-CM | POA: Diagnosis not present

## 2020-03-17 DIAGNOSIS — E1129 Type 2 diabetes mellitus with other diabetic kidney complication: Secondary | ICD-10-CM | POA: Diagnosis not present

## 2020-03-17 DIAGNOSIS — Z79891 Long term (current) use of opiate analgesic: Secondary | ICD-10-CM | POA: Diagnosis not present

## 2020-03-17 DIAGNOSIS — S8012XA Contusion of left lower leg, initial encounter: Secondary | ICD-10-CM | POA: Diagnosis not present

## 2020-03-17 DIAGNOSIS — Z86718 Personal history of other venous thrombosis and embolism: Secondary | ICD-10-CM | POA: Diagnosis not present

## 2020-03-17 DIAGNOSIS — G9341 Metabolic encephalopathy: Secondary | ICD-10-CM | POA: Diagnosis not present

## 2020-03-17 DIAGNOSIS — J9600 Acute respiratory failure, unspecified whether with hypoxia or hypercapnia: Secondary | ICD-10-CM | POA: Diagnosis not present

## 2020-03-17 DIAGNOSIS — I25119 Atherosclerotic heart disease of native coronary artery with unspecified angina pectoris: Secondary | ICD-10-CM | POA: Diagnosis not present

## 2020-03-17 DIAGNOSIS — R791 Abnormal coagulation profile: Secondary | ICD-10-CM | POA: Diagnosis not present

## 2020-03-17 DIAGNOSIS — F633 Trichotillomania: Secondary | ICD-10-CM | POA: Diagnosis not present

## 2020-03-17 DIAGNOSIS — F4310 Post-traumatic stress disorder, unspecified: Secondary | ICD-10-CM | POA: Diagnosis not present

## 2020-03-17 DIAGNOSIS — L03119 Cellulitis of unspecified part of limb: Secondary | ICD-10-CM | POA: Diagnosis not present

## 2020-03-17 DIAGNOSIS — K912 Postsurgical malabsorption, not elsewhere classified: Secondary | ICD-10-CM | POA: Diagnosis not present

## 2020-03-17 DIAGNOSIS — D692 Other nonthrombocytopenic purpura: Secondary | ICD-10-CM | POA: Diagnosis not present

## 2020-03-17 DIAGNOSIS — F5101 Primary insomnia: Secondary | ICD-10-CM | POA: Diagnosis not present

## 2020-03-17 DIAGNOSIS — S3993XA Unspecified injury of pelvis, initial encounter: Secondary | ICD-10-CM | POA: Diagnosis not present

## 2020-03-17 DIAGNOSIS — E211 Secondary hyperparathyroidism, not elsewhere classified: Secondary | ICD-10-CM | POA: Diagnosis not present

## 2020-03-17 DIAGNOSIS — M5440 Lumbago with sciatica, unspecified side: Secondary | ICD-10-CM | POA: Diagnosis not present

## 2020-03-17 DIAGNOSIS — L718 Other rosacea: Secondary | ICD-10-CM | POA: Diagnosis not present

## 2020-03-17 DIAGNOSIS — H25041 Posterior subcapsular polar age-related cataract, right eye: Secondary | ICD-10-CM | POA: Diagnosis not present

## 2020-03-17 DIAGNOSIS — H2513 Age-related nuclear cataract, bilateral: Secondary | ICD-10-CM | POA: Diagnosis not present

## 2020-03-17 DIAGNOSIS — B079 Viral wart, unspecified: Secondary | ICD-10-CM | POA: Diagnosis not present

## 2020-03-17 DIAGNOSIS — M1712 Unilateral primary osteoarthritis, left knee: Secondary | ICD-10-CM | POA: Diagnosis not present

## 2020-03-17 DIAGNOSIS — R1032 Left lower quadrant pain: Secondary | ICD-10-CM | POA: Diagnosis not present

## 2020-03-17 DIAGNOSIS — N4883 Acquired buried penis: Secondary | ICD-10-CM | POA: Diagnosis not present

## 2020-03-17 DIAGNOSIS — J9691 Respiratory failure, unspecified with hypoxia: Secondary | ICD-10-CM | POA: Diagnosis not present

## 2020-03-17 DIAGNOSIS — E0590 Thyrotoxicosis, unspecified without thyrotoxic crisis or storm: Secondary | ICD-10-CM | POA: Diagnosis not present

## 2020-03-17 DIAGNOSIS — E872 Acidosis: Secondary | ICD-10-CM | POA: Diagnosis not present

## 2020-03-17 DIAGNOSIS — G4762 Sleep related leg cramps: Secondary | ICD-10-CM | POA: Diagnosis not present

## 2020-03-17 DIAGNOSIS — I272 Pulmonary hypertension, unspecified: Secondary | ICD-10-CM | POA: Diagnosis not present

## 2020-03-17 DIAGNOSIS — K219 Gastro-esophageal reflux disease without esophagitis: Secondary | ICD-10-CM | POA: Diagnosis not present

## 2020-03-17 DIAGNOSIS — R339 Retention of urine, unspecified: Secondary | ICD-10-CM | POA: Diagnosis not present

## 2020-03-17 DIAGNOSIS — Z89431 Acquired absence of right foot: Secondary | ICD-10-CM | POA: Diagnosis not present

## 2020-03-17 DIAGNOSIS — I25118 Atherosclerotic heart disease of native coronary artery with other forms of angina pectoris: Secondary | ICD-10-CM | POA: Diagnosis not present

## 2020-03-17 DIAGNOSIS — F321 Major depressive disorder, single episode, moderate: Secondary | ICD-10-CM | POA: Diagnosis not present

## 2020-03-17 DIAGNOSIS — I493 Ventricular premature depolarization: Secondary | ICD-10-CM | POA: Diagnosis not present

## 2020-03-17 DIAGNOSIS — M7741 Metatarsalgia, right foot: Secondary | ICD-10-CM | POA: Diagnosis not present

## 2020-03-17 DIAGNOSIS — N362 Urethral caruncle: Secondary | ICD-10-CM | POA: Diagnosis not present

## 2020-03-17 DIAGNOSIS — F3289 Other specified depressive episodes: Secondary | ICD-10-CM | POA: Diagnosis not present

## 2020-03-17 DIAGNOSIS — S63501A Unspecified sprain of right wrist, initial encounter: Secondary | ICD-10-CM | POA: Diagnosis not present

## 2020-03-17 DIAGNOSIS — S0501XA Injury of conjunctiva and corneal abrasion without foreign body, right eye, initial encounter: Secondary | ICD-10-CM | POA: Diagnosis not present

## 2020-03-17 DIAGNOSIS — Z515 Encounter for palliative care: Secondary | ICD-10-CM | POA: Diagnosis not present

## 2020-03-17 DIAGNOSIS — J181 Lobar pneumonia, unspecified organism: Secondary | ICD-10-CM | POA: Diagnosis not present

## 2020-03-17 DIAGNOSIS — N186 End stage renal disease: Secondary | ICD-10-CM | POA: Diagnosis not present

## 2020-03-17 DIAGNOSIS — M6740 Ganglion, unspecified site: Secondary | ICD-10-CM | POA: Diagnosis not present

## 2020-03-17 DIAGNOSIS — R37 Sexual dysfunction, unspecified: Secondary | ICD-10-CM | POA: Diagnosis not present

## 2020-03-17 DIAGNOSIS — R739 Hyperglycemia, unspecified: Secondary | ICD-10-CM | POA: Diagnosis not present

## 2020-03-17 DIAGNOSIS — Z681 Body mass index (BMI) 19 or less, adult: Secondary | ICD-10-CM | POA: Diagnosis not present

## 2020-03-17 DIAGNOSIS — F332 Major depressive disorder, recurrent severe without psychotic features: Secondary | ICD-10-CM | POA: Diagnosis not present

## 2020-03-17 DIAGNOSIS — M66241 Spontaneous rupture of extensor tendons, right hand: Secondary | ICD-10-CM | POA: Diagnosis not present

## 2020-03-17 DIAGNOSIS — I701 Atherosclerosis of renal artery: Secondary | ICD-10-CM | POA: Diagnosis not present

## 2020-03-17 DIAGNOSIS — F324 Major depressive disorder, single episode, in partial remission: Secondary | ICD-10-CM | POA: Diagnosis not present

## 2020-03-17 DIAGNOSIS — N3010 Interstitial cystitis (chronic) without hematuria: Secondary | ICD-10-CM | POA: Diagnosis not present

## 2020-03-17 DIAGNOSIS — T1511XA Foreign body in conjunctival sac, right eye, initial encounter: Secondary | ICD-10-CM | POA: Diagnosis not present

## 2020-03-17 DIAGNOSIS — F339 Major depressive disorder, recurrent, unspecified: Secondary | ICD-10-CM | POA: Diagnosis not present

## 2020-03-17 DIAGNOSIS — M4802 Spinal stenosis, cervical region: Secondary | ICD-10-CM | POA: Diagnosis not present

## 2020-03-17 DIAGNOSIS — G5601 Carpal tunnel syndrome, right upper limb: Secondary | ICD-10-CM | POA: Diagnosis not present

## 2020-03-17 DIAGNOSIS — G35 Multiple sclerosis: Secondary | ICD-10-CM | POA: Diagnosis not present

## 2020-03-17 DIAGNOSIS — M170 Bilateral primary osteoarthritis of knee: Secondary | ICD-10-CM | POA: Diagnosis not present

## 2020-03-17 DIAGNOSIS — M25532 Pain in left wrist: Secondary | ICD-10-CM | POA: Diagnosis not present

## 2020-03-17 DIAGNOSIS — I2583 Coronary atherosclerosis due to lipid rich plaque: Secondary | ICD-10-CM | POA: Diagnosis not present

## 2020-03-17 DIAGNOSIS — E1142 Type 2 diabetes mellitus with diabetic polyneuropathy: Secondary | ICD-10-CM | POA: Diagnosis not present

## 2020-03-17 DIAGNOSIS — Z951 Presence of aortocoronary bypass graft: Secondary | ICD-10-CM | POA: Diagnosis not present

## 2020-03-17 DIAGNOSIS — M25859 Other specified joint disorders, unspecified hip: Secondary | ICD-10-CM | POA: Diagnosis not present

## 2020-03-17 DIAGNOSIS — E118 Type 2 diabetes mellitus with unspecified complications: Secondary | ICD-10-CM | POA: Diagnosis not present

## 2020-03-17 DIAGNOSIS — H52203 Unspecified astigmatism, bilateral: Secondary | ICD-10-CM | POA: Diagnosis not present

## 2020-03-17 DIAGNOSIS — Z9884 Bariatric surgery status: Secondary | ICD-10-CM | POA: Diagnosis not present

## 2020-03-17 DIAGNOSIS — R32 Unspecified urinary incontinence: Secondary | ICD-10-CM | POA: Diagnosis not present

## 2020-03-17 DIAGNOSIS — W010XXA Fall on same level from slipping, tripping and stumbling without subsequent striking against object, initial encounter: Secondary | ICD-10-CM | POA: Diagnosis not present

## 2020-03-17 DIAGNOSIS — I471 Supraventricular tachycardia: Secondary | ICD-10-CM | POA: Diagnosis not present

## 2020-03-17 DIAGNOSIS — E119 Type 2 diabetes mellitus without complications: Secondary | ICD-10-CM | POA: Diagnosis not present

## 2020-03-17 DIAGNOSIS — E8881 Metabolic syndrome: Secondary | ICD-10-CM | POA: Diagnosis not present

## 2020-03-17 DIAGNOSIS — R3981 Functional urinary incontinence: Secondary | ICD-10-CM | POA: Diagnosis not present

## 2020-03-17 DIAGNOSIS — R252 Cramp and spasm: Secondary | ICD-10-CM | POA: Diagnosis not present

## 2020-03-17 DIAGNOSIS — H401130 Primary open-angle glaucoma, bilateral, stage unspecified: Secondary | ICD-10-CM | POA: Diagnosis not present

## 2020-03-17 DIAGNOSIS — I509 Heart failure, unspecified: Secondary | ICD-10-CM | POA: Diagnosis not present

## 2020-03-17 DIAGNOSIS — M19072 Primary osteoarthritis, left ankle and foot: Secondary | ICD-10-CM | POA: Diagnosis not present

## 2020-03-17 DIAGNOSIS — K449 Diaphragmatic hernia without obstruction or gangrene: Secondary | ICD-10-CM | POA: Diagnosis not present

## 2020-03-17 DIAGNOSIS — Z6822 Body mass index (BMI) 22.0-22.9, adult: Secondary | ICD-10-CM | POA: Diagnosis not present

## 2020-03-17 DIAGNOSIS — G500 Trigeminal neuralgia: Secondary | ICD-10-CM | POA: Diagnosis not present

## 2020-03-17 DIAGNOSIS — I110 Hypertensive heart disease with heart failure: Secondary | ICD-10-CM | POA: Diagnosis not present

## 2020-03-17 DIAGNOSIS — N319 Neuromuscular dysfunction of bladder, unspecified: Secondary | ICD-10-CM | POA: Diagnosis not present

## 2020-03-17 DIAGNOSIS — D126 Benign neoplasm of colon, unspecified: Secondary | ICD-10-CM | POA: Diagnosis not present

## 2020-03-17 DIAGNOSIS — F0281 Dementia in other diseases classified elsewhere with behavioral disturbance: Secondary | ICD-10-CM | POA: Diagnosis not present

## 2020-03-17 DIAGNOSIS — M47896 Other spondylosis, lumbar region: Secondary | ICD-10-CM | POA: Diagnosis not present

## 2020-03-17 DIAGNOSIS — F0280 Dementia in other diseases classified elsewhere without behavioral disturbance: Secondary | ICD-10-CM | POA: Diagnosis not present

## 2020-03-17 DIAGNOSIS — J3089 Other allergic rhinitis: Secondary | ICD-10-CM | POA: Diagnosis not present

## 2020-03-17 DIAGNOSIS — F331 Major depressive disorder, recurrent, moderate: Secondary | ICD-10-CM | POA: Diagnosis not present

## 2020-03-17 DIAGNOSIS — Z0000 Encounter for general adult medical examination without abnormal findings: Secondary | ICD-10-CM | POA: Diagnosis not present

## 2020-03-17 DIAGNOSIS — S32010A Wedge compression fracture of first lumbar vertebra, initial encounter for closed fracture: Secondary | ICD-10-CM | POA: Diagnosis not present

## 2020-03-17 DIAGNOSIS — M7752 Other enthesopathy of left foot: Secondary | ICD-10-CM | POA: Diagnosis not present

## 2020-03-17 DIAGNOSIS — R1013 Epigastric pain: Secondary | ICD-10-CM | POA: Diagnosis not present

## 2020-03-17 DIAGNOSIS — Z08 Encounter for follow-up examination after completed treatment for malignant neoplasm: Secondary | ICD-10-CM | POA: Diagnosis not present

## 2020-03-17 DIAGNOSIS — R972 Elevated prostate specific antigen [PSA]: Secondary | ICD-10-CM | POA: Diagnosis not present

## 2020-03-17 DIAGNOSIS — M1611 Unilateral primary osteoarthritis, right hip: Secondary | ICD-10-CM | POA: Diagnosis not present

## 2020-03-17 DIAGNOSIS — H5211 Myopia, right eye: Secondary | ICD-10-CM | POA: Diagnosis not present

## 2020-03-17 DIAGNOSIS — Z20822 Contact with and (suspected) exposure to covid-19: Secondary | ICD-10-CM | POA: Diagnosis not present

## 2020-03-17 DIAGNOSIS — Z789 Other specified health status: Secondary | ICD-10-CM | POA: Diagnosis not present

## 2020-03-17 DIAGNOSIS — Z4889 Encounter for other specified surgical aftercare: Secondary | ICD-10-CM | POA: Diagnosis not present

## 2020-03-17 DIAGNOSIS — C541 Malignant neoplasm of endometrium: Secondary | ICD-10-CM | POA: Diagnosis not present

## 2020-03-17 DIAGNOSIS — M25512 Pain in left shoulder: Secondary | ICD-10-CM | POA: Diagnosis not present

## 2020-03-17 DIAGNOSIS — E6609 Other obesity due to excess calories: Secondary | ICD-10-CM | POA: Diagnosis not present

## 2020-03-17 DIAGNOSIS — H401312 Pigmentary glaucoma, right eye, moderate stage: Secondary | ICD-10-CM | POA: Diagnosis not present

## 2020-03-17 DIAGNOSIS — Z1389 Encounter for screening for other disorder: Secondary | ICD-10-CM | POA: Diagnosis not present

## 2020-03-17 DIAGNOSIS — M9902 Segmental and somatic dysfunction of thoracic region: Secondary | ICD-10-CM | POA: Diagnosis not present

## 2020-03-17 DIAGNOSIS — M47816 Spondylosis without myelopathy or radiculopathy, lumbar region: Secondary | ICD-10-CM | POA: Diagnosis not present

## 2020-03-17 DIAGNOSIS — I5032 Chronic diastolic (congestive) heart failure: Secondary | ICD-10-CM | POA: Diagnosis not present

## 2020-03-17 DIAGNOSIS — E441 Mild protein-calorie malnutrition: Secondary | ICD-10-CM | POA: Diagnosis not present

## 2020-03-17 DIAGNOSIS — Z89412 Acquired absence of left great toe: Secondary | ICD-10-CM | POA: Diagnosis not present

## 2020-03-17 DIAGNOSIS — M19032 Primary osteoarthritis, left wrist: Secondary | ICD-10-CM | POA: Diagnosis not present

## 2020-03-17 DIAGNOSIS — C3412 Malignant neoplasm of upper lobe, left bronchus or lung: Secondary | ICD-10-CM | POA: Diagnosis not present

## 2020-03-17 DIAGNOSIS — H1013 Acute atopic conjunctivitis, bilateral: Secondary | ICD-10-CM | POA: Diagnosis not present

## 2020-03-17 DIAGNOSIS — M79605 Pain in left leg: Secondary | ICD-10-CM | POA: Diagnosis not present

## 2020-03-17 DIAGNOSIS — N281 Cyst of kidney, acquired: Secondary | ICD-10-CM | POA: Diagnosis not present

## 2020-03-17 DIAGNOSIS — F09 Unspecified mental disorder due to known physiological condition: Secondary | ICD-10-CM | POA: Diagnosis not present

## 2020-03-17 DIAGNOSIS — H6121 Impacted cerumen, right ear: Secondary | ICD-10-CM | POA: Diagnosis not present

## 2020-03-17 DIAGNOSIS — Z5181 Encounter for therapeutic drug level monitoring: Secondary | ICD-10-CM | POA: Diagnosis not present

## 2020-03-17 DIAGNOSIS — M797 Fibromyalgia: Secondary | ICD-10-CM | POA: Diagnosis not present

## 2020-03-17 DIAGNOSIS — G459 Transient cerebral ischemic attack, unspecified: Secondary | ICD-10-CM | POA: Diagnosis not present

## 2020-03-17 DIAGNOSIS — M419 Scoliosis, unspecified: Secondary | ICD-10-CM | POA: Diagnosis not present

## 2020-03-17 DIAGNOSIS — Z13220 Encounter for screening for lipoid disorders: Secondary | ICD-10-CM | POA: Diagnosis not present

## 2020-03-17 DIAGNOSIS — M7552 Bursitis of left shoulder: Secondary | ICD-10-CM | POA: Diagnosis not present

## 2020-03-17 DIAGNOSIS — I619 Nontraumatic intracerebral hemorrhage, unspecified: Secondary | ICD-10-CM | POA: Diagnosis not present

## 2020-03-17 DIAGNOSIS — E663 Overweight: Secondary | ICD-10-CM | POA: Diagnosis not present

## 2020-03-17 DIAGNOSIS — L97909 Non-pressure chronic ulcer of unspecified part of unspecified lower leg with unspecified severity: Secondary | ICD-10-CM | POA: Diagnosis not present

## 2020-03-17 DIAGNOSIS — M13842 Other specified arthritis, left hand: Secondary | ICD-10-CM | POA: Diagnosis not present

## 2020-03-17 DIAGNOSIS — Z6837 Body mass index (BMI) 37.0-37.9, adult: Secondary | ICD-10-CM | POA: Diagnosis not present

## 2020-03-17 DIAGNOSIS — M542 Cervicalgia: Secondary | ICD-10-CM | POA: Diagnosis not present

## 2020-03-17 DIAGNOSIS — M5030 Other cervical disc degeneration, unspecified cervical region: Secondary | ICD-10-CM | POA: Diagnosis not present

## 2020-03-17 DIAGNOSIS — F4001 Agoraphobia with panic disorder: Secondary | ICD-10-CM | POA: Diagnosis not present

## 2020-03-17 DIAGNOSIS — R293 Abnormal posture: Secondary | ICD-10-CM | POA: Diagnosis not present

## 2020-03-17 DIAGNOSIS — D500 Iron deficiency anemia secondary to blood loss (chronic): Secondary | ICD-10-CM | POA: Diagnosis not present

## 2020-03-17 DIAGNOSIS — E669 Obesity, unspecified: Secondary | ICD-10-CM | POA: Diagnosis not present

## 2020-03-17 DIAGNOSIS — H401112 Primary open-angle glaucoma, right eye, moderate stage: Secondary | ICD-10-CM | POA: Diagnosis not present

## 2020-03-17 DIAGNOSIS — K581 Irritable bowel syndrome with constipation: Secondary | ICD-10-CM | POA: Diagnosis not present

## 2020-03-17 DIAGNOSIS — H10023 Other mucopurulent conjunctivitis, bilateral: Secondary | ICD-10-CM | POA: Diagnosis not present

## 2020-03-17 DIAGNOSIS — H353131 Nonexudative age-related macular degeneration, bilateral, early dry stage: Secondary | ICD-10-CM | POA: Diagnosis not present

## 2020-03-17 DIAGNOSIS — H52221 Regular astigmatism, right eye: Secondary | ICD-10-CM | POA: Diagnosis not present

## 2020-03-17 DIAGNOSIS — M25562 Pain in left knee: Secondary | ICD-10-CM | POA: Diagnosis not present

## 2020-03-17 DIAGNOSIS — M5137 Other intervertebral disc degeneration, lumbosacral region: Secondary | ICD-10-CM | POA: Diagnosis not present

## 2020-03-17 DIAGNOSIS — R809 Proteinuria, unspecified: Secondary | ICD-10-CM | POA: Diagnosis not present

## 2020-03-17 DIAGNOSIS — F3162 Bipolar disorder, current episode mixed, moderate: Secondary | ICD-10-CM | POA: Diagnosis not present

## 2020-03-17 DIAGNOSIS — G373 Acute transverse myelitis in demyelinating disease of central nervous system: Secondary | ICD-10-CM | POA: Diagnosis not present

## 2020-03-17 DIAGNOSIS — J4530 Mild persistent asthma, uncomplicated: Secondary | ICD-10-CM | POA: Diagnosis not present

## 2020-03-17 DIAGNOSIS — Z0001 Encounter for general adult medical examination with abnormal findings: Secondary | ICD-10-CM | POA: Diagnosis not present

## 2020-03-17 DIAGNOSIS — M2042 Other hammer toe(s) (acquired), left foot: Secondary | ICD-10-CM | POA: Diagnosis not present

## 2020-03-17 DIAGNOSIS — F1010 Alcohol abuse, uncomplicated: Secondary | ICD-10-CM | POA: Diagnosis not present

## 2020-03-17 DIAGNOSIS — M461 Sacroiliitis, not elsewhere classified: Secondary | ICD-10-CM | POA: Diagnosis not present

## 2020-03-17 DIAGNOSIS — M9901 Segmental and somatic dysfunction of cervical region: Secondary | ICD-10-CM | POA: Diagnosis not present

## 2020-03-17 DIAGNOSIS — R9431 Abnormal electrocardiogram [ECG] [EKG]: Secondary | ICD-10-CM | POA: Diagnosis not present

## 2020-03-17 DIAGNOSIS — F330 Major depressive disorder, recurrent, mild: Secondary | ICD-10-CM | POA: Diagnosis not present

## 2020-03-17 DIAGNOSIS — C801 Malignant (primary) neoplasm, unspecified: Secondary | ICD-10-CM | POA: Diagnosis not present

## 2020-03-17 DIAGNOSIS — F3160 Bipolar disorder, current episode mixed, unspecified: Secondary | ICD-10-CM | POA: Diagnosis not present

## 2020-03-17 DIAGNOSIS — R7303 Prediabetes: Secondary | ICD-10-CM | POA: Diagnosis not present

## 2020-03-17 DIAGNOSIS — Z992 Dependence on renal dialysis: Secondary | ICD-10-CM | POA: Diagnosis not present

## 2020-03-17 DIAGNOSIS — I2510 Atherosclerotic heart disease of native coronary artery without angina pectoris: Secondary | ICD-10-CM | POA: Diagnosis not present

## 2020-03-17 DIAGNOSIS — R350 Frequency of micturition: Secondary | ICD-10-CM | POA: Diagnosis not present

## 2020-03-17 DIAGNOSIS — I480 Paroxysmal atrial fibrillation: Secondary | ICD-10-CM | POA: Diagnosis not present

## 2020-03-17 DIAGNOSIS — E1065 Type 1 diabetes mellitus with hyperglycemia: Secondary | ICD-10-CM | POA: Diagnosis not present

## 2020-03-17 DIAGNOSIS — N189 Chronic kidney disease, unspecified: Secondary | ICD-10-CM | POA: Diagnosis not present

## 2020-03-17 DIAGNOSIS — G609 Hereditary and idiopathic neuropathy, unspecified: Secondary | ICD-10-CM | POA: Diagnosis not present

## 2020-03-17 DIAGNOSIS — M5481 Occipital neuralgia: Secondary | ICD-10-CM | POA: Diagnosis not present

## 2020-03-17 DIAGNOSIS — I6990 Unspecified sequelae of unspecified cerebrovascular disease: Secondary | ICD-10-CM | POA: Diagnosis not present

## 2020-03-17 DIAGNOSIS — L84 Corns and callosities: Secondary | ICD-10-CM | POA: Diagnosis not present

## 2020-03-17 DIAGNOSIS — Z6836 Body mass index (BMI) 36.0-36.9, adult: Secondary | ICD-10-CM | POA: Diagnosis not present

## 2020-03-17 DIAGNOSIS — H02831 Dermatochalasis of right upper eyelid: Secondary | ICD-10-CM | POA: Diagnosis not present

## 2020-03-17 DIAGNOSIS — I779 Disorder of arteries and arterioles, unspecified: Secondary | ICD-10-CM | POA: Diagnosis not present

## 2020-03-17 DIAGNOSIS — M5136 Other intervertebral disc degeneration, lumbar region: Secondary | ICD-10-CM | POA: Diagnosis not present

## 2020-03-17 DIAGNOSIS — J302 Other seasonal allergic rhinitis: Secondary | ICD-10-CM | POA: Diagnosis not present

## 2020-03-17 DIAGNOSIS — R05 Cough: Secondary | ICD-10-CM | POA: Diagnosis not present

## 2020-03-17 DIAGNOSIS — M546 Pain in thoracic spine: Secondary | ICD-10-CM | POA: Diagnosis not present

## 2020-03-17 DIAGNOSIS — J329 Chronic sinusitis, unspecified: Secondary | ICD-10-CM | POA: Diagnosis not present

## 2020-03-17 DIAGNOSIS — K8020 Calculus of gallbladder without cholecystitis without obstruction: Secondary | ICD-10-CM | POA: Diagnosis not present

## 2020-03-17 DIAGNOSIS — I158 Other secondary hypertension: Secondary | ICD-10-CM | POA: Diagnosis not present

## 2020-03-17 DIAGNOSIS — S93602A Unspecified sprain of left foot, initial encounter: Secondary | ICD-10-CM | POA: Diagnosis not present

## 2020-03-17 DIAGNOSIS — L570 Actinic keratosis: Secondary | ICD-10-CM | POA: Diagnosis not present

## 2020-03-17 DIAGNOSIS — I639 Cerebral infarction, unspecified: Secondary | ICD-10-CM | POA: Diagnosis not present

## 2020-03-17 DIAGNOSIS — J432 Centrilobular emphysema: Secondary | ICD-10-CM | POA: Diagnosis not present

## 2020-03-17 DIAGNOSIS — M7541 Impingement syndrome of right shoulder: Secondary | ICD-10-CM | POA: Diagnosis not present

## 2020-03-17 DIAGNOSIS — D2261 Melanocytic nevi of right upper limb, including shoulder: Secondary | ICD-10-CM | POA: Diagnosis not present

## 2020-03-17 DIAGNOSIS — H35371 Puckering of macula, right eye: Secondary | ICD-10-CM | POA: Diagnosis not present

## 2020-03-17 DIAGNOSIS — M21612 Bunion of left foot: Secondary | ICD-10-CM | POA: Diagnosis not present

## 2020-03-17 DIAGNOSIS — Z1159 Encounter for screening for other viral diseases: Secondary | ICD-10-CM | POA: Diagnosis not present

## 2020-03-17 DIAGNOSIS — C50811 Malignant neoplasm of overlapping sites of right female breast: Secondary | ICD-10-CM | POA: Diagnosis not present

## 2020-03-17 DIAGNOSIS — M25662 Stiffness of left knee, not elsewhere classified: Secondary | ICD-10-CM | POA: Diagnosis not present

## 2020-03-17 DIAGNOSIS — R600 Localized edema: Secondary | ICD-10-CM | POA: Diagnosis not present

## 2020-03-17 DIAGNOSIS — Z794 Long term (current) use of insulin: Secondary | ICD-10-CM | POA: Diagnosis not present

## 2020-03-17 DIAGNOSIS — R2689 Other abnormalities of gait and mobility: Secondary | ICD-10-CM | POA: Diagnosis not present

## 2020-03-17 DIAGNOSIS — N492 Inflammatory disorders of scrotum: Secondary | ICD-10-CM | POA: Diagnosis not present

## 2020-03-17 DIAGNOSIS — G20 Parkinson's disease: Secondary | ICD-10-CM | POA: Diagnosis not present

## 2020-03-17 DIAGNOSIS — S21209A Unspecified open wound of unspecified back wall of thorax without penetration into thoracic cavity, initial encounter: Secondary | ICD-10-CM | POA: Diagnosis not present

## 2020-03-17 DIAGNOSIS — N390 Urinary tract infection, site not specified: Secondary | ICD-10-CM | POA: Diagnosis not present

## 2020-03-17 DIAGNOSIS — I130 Hypertensive heart and chronic kidney disease with heart failure and stage 1 through stage 4 chronic kidney disease, or unspecified chronic kidney disease: Secondary | ICD-10-CM | POA: Diagnosis not present

## 2020-03-17 DIAGNOSIS — Z79890 Hormone replacement therapy (postmenopausal): Secondary | ICD-10-CM | POA: Diagnosis not present

## 2020-03-17 DIAGNOSIS — R002 Palpitations: Secondary | ICD-10-CM | POA: Diagnosis not present

## 2020-03-17 DIAGNOSIS — L400 Psoriasis vulgaris: Secondary | ICD-10-CM | POA: Diagnosis not present

## 2020-03-17 DIAGNOSIS — J690 Pneumonitis due to inhalation of food and vomit: Secondary | ICD-10-CM | POA: Diagnosis not present

## 2020-03-17 DIAGNOSIS — M3501 Sicca syndrome with keratoconjunctivitis: Secondary | ICD-10-CM | POA: Diagnosis not present

## 2020-03-17 DIAGNOSIS — G43719 Chronic migraine without aura, intractable, without status migrainosus: Secondary | ICD-10-CM | POA: Diagnosis not present

## 2020-03-17 DIAGNOSIS — M159 Polyosteoarthritis, unspecified: Secondary | ICD-10-CM | POA: Diagnosis not present

## 2020-03-17 DIAGNOSIS — L97514 Non-pressure chronic ulcer of other part of right foot with necrosis of bone: Secondary | ICD-10-CM | POA: Diagnosis not present

## 2020-03-17 DIAGNOSIS — R079 Chest pain, unspecified: Secondary | ICD-10-CM | POA: Diagnosis not present

## 2020-03-17 DIAGNOSIS — I252 Old myocardial infarction: Secondary | ICD-10-CM | POA: Diagnosis not present

## 2020-03-17 DIAGNOSIS — Z85828 Personal history of other malignant neoplasm of skin: Secondary | ICD-10-CM | POA: Diagnosis not present

## 2020-03-17 DIAGNOSIS — R197 Diarrhea, unspecified: Secondary | ICD-10-CM | POA: Diagnosis not present

## 2020-03-17 DIAGNOSIS — R410 Disorientation, unspecified: Secondary | ICD-10-CM | POA: Diagnosis not present

## 2020-03-17 DIAGNOSIS — R630 Anorexia: Secondary | ICD-10-CM | POA: Diagnosis not present

## 2020-03-17 DIAGNOSIS — Z9989 Dependence on other enabling machines and devices: Secondary | ICD-10-CM | POA: Diagnosis not present

## 2020-03-18 DIAGNOSIS — G459 Transient cerebral ischemic attack, unspecified: Secondary | ICD-10-CM | POA: Diagnosis not present

## 2020-03-18 DIAGNOSIS — M1712 Unilateral primary osteoarthritis, left knee: Secondary | ICD-10-CM | POA: Diagnosis not present

## 2020-03-18 DIAGNOSIS — S7290XA Unspecified fracture of unspecified femur, initial encounter for closed fracture: Secondary | ICD-10-CM | POA: Diagnosis not present

## 2020-03-18 DIAGNOSIS — D61818 Other pancytopenia: Secondary | ICD-10-CM | POA: Diagnosis not present

## 2020-03-18 DIAGNOSIS — F3342 Major depressive disorder, recurrent, in full remission: Secondary | ICD-10-CM | POA: Diagnosis not present

## 2020-03-18 DIAGNOSIS — J690 Pneumonitis due to inhalation of food and vomit: Secondary | ICD-10-CM | POA: Diagnosis not present

## 2020-03-18 DIAGNOSIS — R29898 Other symptoms and signs involving the musculoskeletal system: Secondary | ICD-10-CM | POA: Diagnosis not present

## 2020-03-18 DIAGNOSIS — G479 Sleep disorder, unspecified: Secondary | ICD-10-CM | POA: Diagnosis not present

## 2020-03-18 DIAGNOSIS — I2510 Atherosclerotic heart disease of native coronary artery without angina pectoris: Secondary | ICD-10-CM | POA: Diagnosis not present

## 2020-03-18 DIAGNOSIS — Z66 Do not resuscitate: Secondary | ICD-10-CM | POA: Diagnosis not present

## 2020-03-18 DIAGNOSIS — I208 Other forms of angina pectoris: Secondary | ICD-10-CM | POA: Diagnosis not present

## 2020-03-18 DIAGNOSIS — M5416 Radiculopathy, lumbar region: Secondary | ICD-10-CM | POA: Diagnosis not present

## 2020-03-18 DIAGNOSIS — M545 Low back pain: Secondary | ICD-10-CM | POA: Diagnosis not present

## 2020-03-18 DIAGNOSIS — Z7189 Other specified counseling: Secondary | ICD-10-CM | POA: Diagnosis not present

## 2020-03-18 DIAGNOSIS — L02415 Cutaneous abscess of right lower limb: Secondary | ICD-10-CM | POA: Diagnosis not present

## 2020-03-18 DIAGNOSIS — H6123 Impacted cerumen, bilateral: Secondary | ICD-10-CM | POA: Diagnosis not present

## 2020-03-18 DIAGNOSIS — H04123 Dry eye syndrome of bilateral lacrimal glands: Secondary | ICD-10-CM | POA: Diagnosis not present

## 2020-03-18 DIAGNOSIS — R410 Disorientation, unspecified: Secondary | ICD-10-CM | POA: Diagnosis not present

## 2020-03-18 DIAGNOSIS — I480 Paroxysmal atrial fibrillation: Secondary | ICD-10-CM | POA: Diagnosis not present

## 2020-03-18 DIAGNOSIS — I63139 Cerebral infarction due to embolism of unspecified carotid artery: Secondary | ICD-10-CM | POA: Diagnosis not present

## 2020-03-18 DIAGNOSIS — M159 Polyosteoarthritis, unspecified: Secondary | ICD-10-CM | POA: Diagnosis not present

## 2020-03-18 DIAGNOSIS — I890 Lymphedema, not elsewhere classified: Secondary | ICD-10-CM | POA: Diagnosis not present

## 2020-03-18 DIAGNOSIS — J9601 Acute respiratory failure with hypoxia: Secondary | ICD-10-CM | POA: Diagnosis not present

## 2020-03-18 DIAGNOSIS — G7111 Myotonic muscular dystrophy: Secondary | ICD-10-CM | POA: Diagnosis not present

## 2020-03-18 DIAGNOSIS — J432 Centrilobular emphysema: Secondary | ICD-10-CM | POA: Diagnosis not present

## 2020-03-18 DIAGNOSIS — I6310 Cerebral infarction due to embolism of unspecified precerebral artery: Secondary | ICD-10-CM | POA: Diagnosis not present

## 2020-03-18 DIAGNOSIS — F419 Anxiety disorder, unspecified: Secondary | ICD-10-CM | POA: Diagnosis not present

## 2020-03-18 DIAGNOSIS — E876 Hypokalemia: Secondary | ICD-10-CM | POA: Diagnosis not present

## 2020-03-18 DIAGNOSIS — E8809 Other disorders of plasma-protein metabolism, not elsewhere classified: Secondary | ICD-10-CM | POA: Diagnosis not present

## 2020-03-18 DIAGNOSIS — Z0131 Encounter for examination of blood pressure with abnormal findings: Secondary | ICD-10-CM | POA: Diagnosis not present

## 2020-03-18 DIAGNOSIS — F0390 Unspecified dementia without behavioral disturbance: Secondary | ICD-10-CM | POA: Diagnosis not present

## 2020-03-18 DIAGNOSIS — 419620001 Death: Secondary | SNOMED CT | POA: Diagnosis not present

## 2020-03-18 DIAGNOSIS — R079 Chest pain, unspecified: Secondary | ICD-10-CM | POA: Diagnosis not present

## 2020-03-18 DIAGNOSIS — E349 Endocrine disorder, unspecified: Secondary | ICD-10-CM | POA: Diagnosis not present

## 2020-03-18 DIAGNOSIS — F5101 Primary insomnia: Secondary | ICD-10-CM | POA: Diagnosis not present

## 2020-03-18 DIAGNOSIS — D62 Acute posthemorrhagic anemia: Secondary | ICD-10-CM | POA: Diagnosis not present

## 2020-03-18 DIAGNOSIS — I10 Essential (primary) hypertension: Secondary | ICD-10-CM | POA: Diagnosis not present

## 2020-03-18 DIAGNOSIS — I5043 Acute on chronic combined systolic (congestive) and diastolic (congestive) heart failure: Secondary | ICD-10-CM | POA: Diagnosis not present

## 2020-03-18 DIAGNOSIS — R339 Retention of urine, unspecified: Secondary | ICD-10-CM | POA: Diagnosis not present

## 2020-03-18 DIAGNOSIS — Z993 Dependence on wheelchair: Secondary | ICD-10-CM | POA: Diagnosis not present

## 2020-03-18 DIAGNOSIS — U071 COVID-19: Secondary | ICD-10-CM | POA: Diagnosis not present

## 2020-03-18 DIAGNOSIS — I517 Cardiomegaly: Secondary | ICD-10-CM | POA: Diagnosis not present

## 2020-03-18 DIAGNOSIS — I63532 Cerebral infarction due to unspecified occlusion or stenosis of left posterior cerebral artery: Secondary | ICD-10-CM | POA: Diagnosis not present

## 2020-03-18 DIAGNOSIS — E7800 Pure hypercholesterolemia, unspecified: Secondary | ICD-10-CM | POA: Diagnosis not present

## 2020-03-18 DIAGNOSIS — L03116 Cellulitis of left lower limb: Secondary | ICD-10-CM | POA: Diagnosis not present

## 2020-03-18 DIAGNOSIS — I639 Cerebral infarction, unspecified: Secondary | ICD-10-CM | POA: Diagnosis not present

## 2020-03-18 DIAGNOSIS — I6340 Cerebral infarction due to embolism of unspecified cerebral artery: Secondary | ICD-10-CM | POA: Diagnosis not present

## 2020-03-18 DIAGNOSIS — K219 Gastro-esophageal reflux disease without esophagitis: Secondary | ICD-10-CM | POA: Diagnosis not present

## 2020-03-18 DIAGNOSIS — G4700 Insomnia, unspecified: Secondary | ICD-10-CM | POA: Diagnosis not present

## 2020-03-18 DIAGNOSIS — S8012XA Contusion of left lower leg, initial encounter: Secondary | ICD-10-CM | POA: Diagnosis not present

## 2020-03-18 DIAGNOSIS — N529 Male erectile dysfunction, unspecified: Secondary | ICD-10-CM | POA: Diagnosis not present

## 2020-03-18 DIAGNOSIS — Z20822 Contact with and (suspected) exposure to covid-19: Secondary | ICD-10-CM | POA: Diagnosis not present

## 2020-03-18 DIAGNOSIS — G35 Multiple sclerosis: Secondary | ICD-10-CM | POA: Diagnosis not present

## 2020-03-18 DIAGNOSIS — J849 Interstitial pulmonary disease, unspecified: Secondary | ICD-10-CM | POA: Diagnosis not present

## 2020-03-18 DIAGNOSIS — G441 Vascular headache, not elsewhere classified: Secondary | ICD-10-CM | POA: Diagnosis not present

## 2020-03-18 DIAGNOSIS — G9340 Encephalopathy, unspecified: Secondary | ICD-10-CM | POA: Diagnosis not present

## 2020-03-18 DIAGNOSIS — Z515 Encounter for palliative care: Secondary | ICD-10-CM | POA: Diagnosis not present

## 2020-03-18 DIAGNOSIS — R300 Dysuria: Secondary | ICD-10-CM | POA: Diagnosis not present

## 2020-03-18 DIAGNOSIS — M4126 Other idiopathic scoliosis, lumbar region: Secondary | ICD-10-CM | POA: Diagnosis not present

## 2020-03-18 DIAGNOSIS — J449 Chronic obstructive pulmonary disease, unspecified: Secondary | ICD-10-CM | POA: Diagnosis not present

## 2020-03-18 DIAGNOSIS — I63013 Cerebral infarction due to thrombosis of bilateral vertebral arteries: Secondary | ICD-10-CM | POA: Diagnosis not present

## 2020-03-18 DIAGNOSIS — J9621 Acute and chronic respiratory failure with hypoxia: Secondary | ICD-10-CM | POA: Diagnosis not present

## 2020-03-18 DIAGNOSIS — J431 Panlobular emphysema: Secondary | ICD-10-CM | POA: Diagnosis not present

## 2020-03-18 DIAGNOSIS — M25551 Pain in right hip: Secondary | ICD-10-CM | POA: Diagnosis not present

## 2020-03-18 DIAGNOSIS — N312 Flaccid neuropathic bladder, not elsewhere classified: Secondary | ICD-10-CM | POA: Diagnosis not present

## 2020-03-18 DIAGNOSIS — N3001 Acute cystitis with hematuria: Secondary | ICD-10-CM | POA: Diagnosis not present

## 2020-03-18 DIAGNOSIS — J479 Bronchiectasis, uncomplicated: Secondary | ICD-10-CM | POA: Diagnosis not present

## 2020-03-18 DIAGNOSIS — E46 Unspecified protein-calorie malnutrition: Secondary | ICD-10-CM | POA: Diagnosis not present

## 2020-03-18 DIAGNOSIS — R3129 Other microscopic hematuria: Secondary | ICD-10-CM | POA: Diagnosis not present

## 2020-03-18 DIAGNOSIS — J189 Pneumonia, unspecified organism: Secondary | ICD-10-CM | POA: Diagnosis not present

## 2020-03-18 DIAGNOSIS — E1122 Type 2 diabetes mellitus with diabetic chronic kidney disease: Secondary | ICD-10-CM | POA: Diagnosis not present

## 2020-03-18 DIAGNOSIS — G894 Chronic pain syndrome: Secondary | ICD-10-CM | POA: Diagnosis not present

## 2020-03-18 DIAGNOSIS — N139 Obstructive and reflux uropathy, unspecified: Secondary | ICD-10-CM | POA: Diagnosis not present

## 2020-03-18 DIAGNOSIS — N400 Enlarged prostate without lower urinary tract symptoms: Secondary | ICD-10-CM | POA: Diagnosis not present

## 2020-03-18 DIAGNOSIS — G4733 Obstructive sleep apnea (adult) (pediatric): Secondary | ICD-10-CM | POA: Diagnosis not present

## 2020-03-18 DIAGNOSIS — H40033 Anatomical narrow angle, bilateral: Secondary | ICD-10-CM | POA: Diagnosis not present

## 2020-03-18 DIAGNOSIS — R42 Dizziness and giddiness: Secondary | ICD-10-CM | POA: Diagnosis not present

## 2020-03-18 DIAGNOSIS — E118 Type 2 diabetes mellitus with unspecified complications: Secondary | ICD-10-CM | POA: Diagnosis not present

## 2020-03-18 DIAGNOSIS — I340 Nonrheumatic mitral (valve) insufficiency: Secondary | ICD-10-CM | POA: Diagnosis not present

## 2020-03-18 DIAGNOSIS — F061 Catatonic disorder due to known physiological condition: Secondary | ICD-10-CM | POA: Diagnosis not present

## 2020-03-18 DIAGNOSIS — F332 Major depressive disorder, recurrent severe without psychotic features: Secondary | ICD-10-CM | POA: Diagnosis not present

## 2020-03-18 DIAGNOSIS — R3914 Feeling of incomplete bladder emptying: Secondary | ICD-10-CM | POA: Diagnosis not present

## 2020-03-18 DIAGNOSIS — N184 Chronic kidney disease, stage 4 (severe): Secondary | ICD-10-CM | POA: Diagnosis not present

## 2020-03-18 DIAGNOSIS — I351 Nonrheumatic aortic (valve) insufficiency: Secondary | ICD-10-CM | POA: Diagnosis not present

## 2020-03-18 DIAGNOSIS — L89152 Pressure ulcer of sacral region, stage 2: Secondary | ICD-10-CM | POA: Diagnosis not present

## 2020-03-18 DIAGNOSIS — E041 Nontoxic single thyroid nodule: Secondary | ICD-10-CM | POA: Diagnosis not present

## 2020-03-18 DIAGNOSIS — J302 Other seasonal allergic rhinitis: Secondary | ICD-10-CM | POA: Diagnosis not present

## 2020-03-18 DEATH — deceased

## 2020-03-19 DIAGNOSIS — J449 Chronic obstructive pulmonary disease, unspecified: Secondary | ICD-10-CM | POA: Diagnosis not present

## 2020-03-19 DIAGNOSIS — I5043 Acute on chronic combined systolic (congestive) and diastolic (congestive) heart failure: Secondary | ICD-10-CM | POA: Diagnosis not present

## 2020-03-19 DIAGNOSIS — I5031 Acute diastolic (congestive) heart failure: Secondary | ICD-10-CM | POA: Diagnosis not present

## 2020-03-19 DIAGNOSIS — M545 Low back pain: Secondary | ICD-10-CM | POA: Diagnosis not present

## 2020-03-19 DIAGNOSIS — R5383 Other fatigue: Secondary | ICD-10-CM | POA: Diagnosis not present

## 2020-03-19 DIAGNOSIS — N400 Enlarged prostate without lower urinary tract symptoms: Secondary | ICD-10-CM | POA: Diagnosis not present

## 2020-03-19 DIAGNOSIS — U071 COVID-19: Secondary | ICD-10-CM | POA: Diagnosis not present

## 2020-03-19 DIAGNOSIS — J8410 Pulmonary fibrosis, unspecified: Secondary | ICD-10-CM | POA: Diagnosis not present

## 2020-03-19 DIAGNOSIS — I471 Supraventricular tachycardia: Secondary | ICD-10-CM | POA: Diagnosis not present

## 2020-03-19 DIAGNOSIS — G894 Chronic pain syndrome: Secondary | ICD-10-CM | POA: Diagnosis not present

## 2020-03-19 DIAGNOSIS — R600 Localized edema: Secondary | ICD-10-CM | POA: Diagnosis not present

## 2020-03-19 DIAGNOSIS — R05 Cough: Secondary | ICD-10-CM | POA: Diagnosis not present

## 2020-03-19 DIAGNOSIS — I10 Essential (primary) hypertension: Secondary | ICD-10-CM | POA: Diagnosis not present

## 2020-03-19 DIAGNOSIS — G441 Vascular headache, not elsewhere classified: Secondary | ICD-10-CM | POA: Diagnosis not present

## 2020-03-19 DIAGNOSIS — D62 Acute posthemorrhagic anemia: Secondary | ICD-10-CM | POA: Diagnosis not present

## 2020-03-19 DIAGNOSIS — I6340 Cerebral infarction due to embolism of unspecified cerebral artery: Secondary | ICD-10-CM | POA: Diagnosis not present

## 2020-03-19 DIAGNOSIS — R0902 Hypoxemia: Secondary | ICD-10-CM | POA: Diagnosis not present

## 2020-03-19 DIAGNOSIS — J209 Acute bronchitis, unspecified: Secondary | ICD-10-CM | POA: Diagnosis not present

## 2020-03-19 DIAGNOSIS — J9611 Chronic respiratory failure with hypoxia: Secondary | ICD-10-CM | POA: Diagnosis not present

## 2020-03-19 DIAGNOSIS — M1711 Unilateral primary osteoarthritis, right knee: Secondary | ICD-10-CM | POA: Diagnosis not present

## 2020-03-19 DIAGNOSIS — J302 Other seasonal allergic rhinitis: Secondary | ICD-10-CM | POA: Diagnosis not present

## 2020-03-19 DIAGNOSIS — E46 Unspecified protein-calorie malnutrition: Secondary | ICD-10-CM | POA: Diagnosis not present

## 2020-03-19 DIAGNOSIS — J309 Allergic rhinitis, unspecified: Secondary | ICD-10-CM | POA: Diagnosis not present

## 2020-03-19 DIAGNOSIS — G4733 Obstructive sleep apnea (adult) (pediatric): Secondary | ICD-10-CM | POA: Diagnosis not present

## 2020-03-19 DIAGNOSIS — E8809 Other disorders of plasma-protein metabolism, not elsewhere classified: Secondary | ICD-10-CM | POA: Diagnosis not present

## 2020-03-19 DIAGNOSIS — E119 Type 2 diabetes mellitus without complications: Secondary | ICD-10-CM | POA: Diagnosis not present

## 2020-03-19 DIAGNOSIS — I63139 Cerebral infarction due to embolism of unspecified carotid artery: Secondary | ICD-10-CM | POA: Diagnosis not present

## 2020-03-19 DIAGNOSIS — I6310 Cerebral infarction due to embolism of unspecified precerebral artery: Secondary | ICD-10-CM | POA: Diagnosis not present

## 2020-03-19 DIAGNOSIS — G479 Sleep disorder, unspecified: Secondary | ICD-10-CM | POA: Diagnosis not present

## 2020-03-19 DIAGNOSIS — R29898 Other symptoms and signs involving the musculoskeletal system: Secondary | ICD-10-CM | POA: Diagnosis not present

## 2020-03-19 DIAGNOSIS — J45909 Unspecified asthma, uncomplicated: Secondary | ICD-10-CM | POA: Diagnosis not present

## 2020-03-20 DIAGNOSIS — J849 Interstitial pulmonary disease, unspecified: Secondary | ICD-10-CM | POA: Diagnosis not present

## 2020-03-20 DIAGNOSIS — J9611 Chronic respiratory failure with hypoxia: Secondary | ICD-10-CM | POA: Diagnosis not present

## 2020-03-20 DIAGNOSIS — Z6839 Body mass index (BMI) 39.0-39.9, adult: Secondary | ICD-10-CM | POA: Diagnosis not present

## 2020-03-20 DIAGNOSIS — D2271 Melanocytic nevi of right lower limb, including hip: Secondary | ICD-10-CM | POA: Diagnosis not present

## 2020-03-20 DIAGNOSIS — R202 Paresthesia of skin: Secondary | ICD-10-CM | POA: Diagnosis not present

## 2020-03-20 DIAGNOSIS — R5381 Other malaise: Secondary | ICD-10-CM | POA: Diagnosis not present

## 2020-03-20 DIAGNOSIS — F17290 Nicotine dependence, other tobacco product, uncomplicated: Secondary | ICD-10-CM | POA: Diagnosis not present

## 2020-03-20 DIAGNOSIS — R768 Other specified abnormal immunological findings in serum: Secondary | ICD-10-CM | POA: Diagnosis not present

## 2020-03-20 DIAGNOSIS — H4312 Vitreous hemorrhage, left eye: Secondary | ICD-10-CM | POA: Diagnosis not present

## 2020-03-20 DIAGNOSIS — I5020 Unspecified systolic (congestive) heart failure: Secondary | ICD-10-CM | POA: Diagnosis not present

## 2020-03-20 DIAGNOSIS — G8929 Other chronic pain: Secondary | ICD-10-CM | POA: Diagnosis not present

## 2020-03-20 DIAGNOSIS — J449 Chronic obstructive pulmonary disease, unspecified: Secondary | ICD-10-CM | POA: Diagnosis not present

## 2020-03-20 DIAGNOSIS — Z8719 Personal history of other diseases of the digestive system: Secondary | ICD-10-CM | POA: Diagnosis not present

## 2020-03-20 DIAGNOSIS — M25461 Effusion, right knee: Secondary | ICD-10-CM | POA: Diagnosis not present

## 2020-03-20 DIAGNOSIS — M2550 Pain in unspecified joint: Secondary | ICD-10-CM | POA: Diagnosis not present

## 2020-03-20 DIAGNOSIS — M170 Bilateral primary osteoarthritis of knee: Secondary | ICD-10-CM | POA: Diagnosis not present

## 2020-03-20 DIAGNOSIS — G5701 Lesion of sciatic nerve, right lower limb: Secondary | ICD-10-CM | POA: Diagnosis not present

## 2020-03-20 DIAGNOSIS — M545 Low back pain: Secondary | ICD-10-CM | POA: Diagnosis not present

## 2020-03-20 DIAGNOSIS — A084 Viral intestinal infection, unspecified: Secondary | ICD-10-CM | POA: Diagnosis not present

## 2020-03-20 DIAGNOSIS — N951 Menopausal and female climacteric states: Secondary | ICD-10-CM | POA: Diagnosis not present

## 2020-03-20 DIAGNOSIS — I472 Ventricular tachycardia: Secondary | ICD-10-CM | POA: Diagnosis not present

## 2020-03-20 DIAGNOSIS — M0579 Rheumatoid arthritis with rheumatoid factor of multiple sites without organ or systems involvement: Secondary | ICD-10-CM | POA: Diagnosis not present

## 2020-03-20 DIAGNOSIS — N049 Nephrotic syndrome with unspecified morphologic changes: Secondary | ICD-10-CM | POA: Diagnosis not present

## 2020-03-20 DIAGNOSIS — F3341 Major depressive disorder, recurrent, in partial remission: Secondary | ICD-10-CM | POA: Diagnosis not present

## 2020-03-20 DIAGNOSIS — I6523 Occlusion and stenosis of bilateral carotid arteries: Secondary | ICD-10-CM | POA: Diagnosis not present

## 2020-03-20 DIAGNOSIS — M7061 Trochanteric bursitis, right hip: Secondary | ICD-10-CM | POA: Diagnosis not present

## 2020-03-20 DIAGNOSIS — K5900 Constipation, unspecified: Secondary | ICD-10-CM | POA: Diagnosis not present

## 2020-03-20 DIAGNOSIS — R9431 Abnormal electrocardiogram [ECG] [EKG]: Secondary | ICD-10-CM | POA: Diagnosis not present

## 2020-03-20 DIAGNOSIS — K5792 Diverticulitis of intestine, part unspecified, without perforation or abscess without bleeding: Secondary | ICD-10-CM | POA: Diagnosis not present

## 2020-03-20 DIAGNOSIS — M65342 Trigger finger, left ring finger: Secondary | ICD-10-CM | POA: Diagnosis not present

## 2020-03-20 DIAGNOSIS — H26493 Other secondary cataract, bilateral: Secondary | ICD-10-CM | POA: Diagnosis not present

## 2020-03-20 DIAGNOSIS — E611 Iron deficiency: Secondary | ICD-10-CM | POA: Diagnosis not present

## 2020-03-20 DIAGNOSIS — S29012A Strain of muscle and tendon of back wall of thorax, initial encounter: Secondary | ICD-10-CM | POA: Diagnosis not present

## 2020-03-20 DIAGNOSIS — Z6835 Body mass index (BMI) 35.0-35.9, adult: Secondary | ICD-10-CM | POA: Diagnosis not present

## 2020-03-20 DIAGNOSIS — R079 Chest pain, unspecified: Secondary | ICD-10-CM | POA: Diagnosis not present

## 2020-03-20 DIAGNOSIS — S62331A Displaced fracture of neck of second metacarpal bone, left hand, initial encounter for closed fracture: Secondary | ICD-10-CM | POA: Diagnosis not present

## 2020-03-20 DIAGNOSIS — Z0000 Encounter for general adult medical examination without abnormal findings: Secondary | ICD-10-CM | POA: Diagnosis not present

## 2020-03-20 DIAGNOSIS — F0280 Dementia in other diseases classified elsewhere without behavioral disturbance: Secondary | ICD-10-CM | POA: Diagnosis not present

## 2020-03-20 DIAGNOSIS — H52223 Regular astigmatism, bilateral: Secondary | ICD-10-CM | POA: Diagnosis not present

## 2020-03-20 DIAGNOSIS — M5136 Other intervertebral disc degeneration, lumbar region: Secondary | ICD-10-CM | POA: Diagnosis not present

## 2020-03-20 DIAGNOSIS — T148XXA Other injury of unspecified body region, initial encounter: Secondary | ICD-10-CM | POA: Diagnosis not present

## 2020-03-20 DIAGNOSIS — L821 Other seborrheic keratosis: Secondary | ICD-10-CM | POA: Diagnosis not present

## 2020-03-20 DIAGNOSIS — H25813 Combined forms of age-related cataract, bilateral: Secondary | ICD-10-CM | POA: Diagnosis not present

## 2020-03-20 DIAGNOSIS — R0989 Other specified symptoms and signs involving the circulatory and respiratory systems: Secondary | ICD-10-CM | POA: Diagnosis not present

## 2020-03-20 DIAGNOSIS — L89893 Pressure ulcer of other site, stage 3: Secondary | ICD-10-CM | POA: Diagnosis not present

## 2020-03-20 DIAGNOSIS — H0100A Unspecified blepharitis right eye, upper and lower eyelids: Secondary | ICD-10-CM | POA: Diagnosis not present

## 2020-03-20 DIAGNOSIS — Z6841 Body Mass Index (BMI) 40.0 and over, adult: Secondary | ICD-10-CM | POA: Diagnosis not present

## 2020-03-20 DIAGNOSIS — R030 Elevated blood-pressure reading, without diagnosis of hypertension: Secondary | ICD-10-CM | POA: Diagnosis not present

## 2020-03-20 DIAGNOSIS — I471 Supraventricular tachycardia: Secondary | ICD-10-CM | POA: Diagnosis not present

## 2020-03-20 DIAGNOSIS — Z7901 Long term (current) use of anticoagulants: Secondary | ICD-10-CM | POA: Diagnosis not present

## 2020-03-20 DIAGNOSIS — M109 Gout, unspecified: Secondary | ICD-10-CM | POA: Diagnosis not present

## 2020-03-20 DIAGNOSIS — G250 Essential tremor: Secondary | ICD-10-CM | POA: Diagnosis not present

## 2020-03-20 DIAGNOSIS — N401 Enlarged prostate with lower urinary tract symptoms: Secondary | ICD-10-CM | POA: Diagnosis not present

## 2020-03-20 DIAGNOSIS — R351 Nocturia: Secondary | ICD-10-CM | POA: Diagnosis not present

## 2020-03-20 DIAGNOSIS — M25661 Stiffness of right knee, not elsewhere classified: Secondary | ICD-10-CM | POA: Diagnosis not present

## 2020-03-20 DIAGNOSIS — R309 Painful micturition, unspecified: Secondary | ICD-10-CM | POA: Diagnosis not present

## 2020-03-20 DIAGNOSIS — D23111 Other benign neoplasm of skin of right upper eyelid, including canthus: Secondary | ICD-10-CM | POA: Diagnosis not present

## 2020-03-20 DIAGNOSIS — F419 Anxiety disorder, unspecified: Secondary | ICD-10-CM | POA: Diagnosis not present

## 2020-03-20 DIAGNOSIS — B0229 Other postherpetic nervous system involvement: Secondary | ICD-10-CM | POA: Diagnosis not present

## 2020-03-20 DIAGNOSIS — Z1331 Encounter for screening for depression: Secondary | ICD-10-CM | POA: Diagnosis not present

## 2020-03-20 DIAGNOSIS — M9901 Segmental and somatic dysfunction of cervical region: Secondary | ICD-10-CM | POA: Diagnosis not present

## 2020-03-20 DIAGNOSIS — I350 Nonrheumatic aortic (valve) stenosis: Secondary | ICD-10-CM | POA: Diagnosis not present

## 2020-03-20 DIAGNOSIS — F3132 Bipolar disorder, current episode depressed, moderate: Secondary | ICD-10-CM | POA: Diagnosis not present

## 2020-03-20 DIAGNOSIS — K224 Dyskinesia of esophagus: Secondary | ICD-10-CM | POA: Diagnosis not present

## 2020-03-20 DIAGNOSIS — Z7189 Other specified counseling: Secondary | ICD-10-CM | POA: Diagnosis not present

## 2020-03-20 DIAGNOSIS — E6609 Other obesity due to excess calories: Secondary | ICD-10-CM | POA: Diagnosis not present

## 2020-03-20 DIAGNOSIS — G20 Parkinson's disease: Secondary | ICD-10-CM | POA: Diagnosis not present

## 2020-03-20 DIAGNOSIS — R809 Proteinuria, unspecified: Secondary | ICD-10-CM | POA: Diagnosis not present

## 2020-03-20 DIAGNOSIS — M130 Polyarthritis, unspecified: Secondary | ICD-10-CM | POA: Diagnosis not present

## 2020-03-20 DIAGNOSIS — I313 Pericardial effusion (noninflammatory): Secondary | ICD-10-CM | POA: Diagnosis not present

## 2020-03-20 DIAGNOSIS — M546 Pain in thoracic spine: Secondary | ICD-10-CM | POA: Diagnosis not present

## 2020-03-20 DIAGNOSIS — R142 Eructation: Secondary | ICD-10-CM | POA: Diagnosis not present

## 2020-03-20 DIAGNOSIS — D510 Vitamin B12 deficiency anemia due to intrinsic factor deficiency: Secondary | ICD-10-CM | POA: Diagnosis not present

## 2020-03-20 DIAGNOSIS — H353132 Nonexudative age-related macular degeneration, bilateral, intermediate dry stage: Secondary | ICD-10-CM | POA: Diagnosis not present

## 2020-03-20 DIAGNOSIS — M5417 Radiculopathy, lumbosacral region: Secondary | ICD-10-CM | POA: Diagnosis not present

## 2020-03-20 DIAGNOSIS — I493 Ventricular premature depolarization: Secondary | ICD-10-CM | POA: Diagnosis not present

## 2020-03-20 DIAGNOSIS — E7525 Metachromatic leukodystrophy: Secondary | ICD-10-CM | POA: Diagnosis not present

## 2020-03-20 DIAGNOSIS — H401222 Low-tension glaucoma, left eye, moderate stage: Secondary | ICD-10-CM | POA: Diagnosis not present

## 2020-03-20 DIAGNOSIS — I5032 Chronic diastolic (congestive) heart failure: Secondary | ICD-10-CM | POA: Diagnosis not present

## 2020-03-20 DIAGNOSIS — G3109 Other frontotemporal dementia: Secondary | ICD-10-CM | POA: Diagnosis not present

## 2020-03-20 DIAGNOSIS — G35 Multiple sclerosis: Secondary | ICD-10-CM | POA: Diagnosis not present

## 2020-03-20 DIAGNOSIS — D519 Vitamin B12 deficiency anemia, unspecified: Secondary | ICD-10-CM | POA: Diagnosis not present

## 2020-03-20 DIAGNOSIS — M5030 Other cervical disc degeneration, unspecified cervical region: Secondary | ICD-10-CM | POA: Diagnosis not present

## 2020-03-20 DIAGNOSIS — L570 Actinic keratosis: Secondary | ICD-10-CM | POA: Diagnosis not present

## 2020-03-20 DIAGNOSIS — I10 Essential (primary) hypertension: Secondary | ICD-10-CM | POA: Diagnosis not present

## 2020-03-20 DIAGNOSIS — S86819A Strain of other muscle(s) and tendon(s) at lower leg level, unspecified leg, initial encounter: Secondary | ICD-10-CM | POA: Diagnosis not present

## 2020-03-20 DIAGNOSIS — M4150 Other secondary scoliosis, site unspecified: Secondary | ICD-10-CM | POA: Diagnosis not present

## 2020-03-20 DIAGNOSIS — G903 Multi-system degeneration of the autonomic nervous system: Secondary | ICD-10-CM | POA: Diagnosis not present

## 2020-03-20 DIAGNOSIS — E11610 Type 2 diabetes mellitus with diabetic neuropathic arthropathy: Secondary | ICD-10-CM | POA: Diagnosis not present

## 2020-03-20 DIAGNOSIS — Z79891 Long term (current) use of opiate analgesic: Secondary | ICD-10-CM | POA: Diagnosis not present

## 2020-03-20 DIAGNOSIS — J069 Acute upper respiratory infection, unspecified: Secondary | ICD-10-CM | POA: Diagnosis not present

## 2020-03-20 DIAGNOSIS — C672 Malignant neoplasm of lateral wall of bladder: Secondary | ICD-10-CM | POA: Diagnosis not present

## 2020-03-20 DIAGNOSIS — R0981 Nasal congestion: Secondary | ICD-10-CM | POA: Diagnosis not present

## 2020-03-20 DIAGNOSIS — L409 Psoriasis, unspecified: Secondary | ICD-10-CM | POA: Diagnosis not present

## 2020-03-20 DIAGNOSIS — Z79899 Other long term (current) drug therapy: Secondary | ICD-10-CM | POA: Diagnosis not present

## 2020-03-20 DIAGNOSIS — I2583 Coronary atherosclerosis due to lipid rich plaque: Secondary | ICD-10-CM | POA: Diagnosis not present

## 2020-03-20 DIAGNOSIS — I420 Dilated cardiomyopathy: Secondary | ICD-10-CM | POA: Diagnosis not present

## 2020-03-20 DIAGNOSIS — C4492 Squamous cell carcinoma of skin, unspecified: Secondary | ICD-10-CM | POA: Diagnosis not present

## 2020-03-20 DIAGNOSIS — L0390 Cellulitis, unspecified: Secondary | ICD-10-CM | POA: Diagnosis not present

## 2020-03-20 DIAGNOSIS — M5137 Other intervertebral disc degeneration, lumbosacral region: Secondary | ICD-10-CM | POA: Diagnosis not present

## 2020-03-20 DIAGNOSIS — L299 Pruritus, unspecified: Secondary | ICD-10-CM | POA: Diagnosis not present

## 2020-03-20 DIAGNOSIS — E1169 Type 2 diabetes mellitus with other specified complication: Secondary | ICD-10-CM | POA: Diagnosis not present

## 2020-03-20 DIAGNOSIS — M25572 Pain in left ankle and joints of left foot: Secondary | ICD-10-CM | POA: Diagnosis not present

## 2020-03-20 DIAGNOSIS — E7849 Other hyperlipidemia: Secondary | ICD-10-CM | POA: Diagnosis not present

## 2020-03-20 DIAGNOSIS — R600 Localized edema: Secondary | ICD-10-CM | POA: Diagnosis not present

## 2020-03-20 DIAGNOSIS — R0601 Orthopnea: Secondary | ICD-10-CM | POA: Diagnosis not present

## 2020-03-20 DIAGNOSIS — H26491 Other secondary cataract, right eye: Secondary | ICD-10-CM | POA: Diagnosis not present

## 2020-03-20 DIAGNOSIS — D1801 Hemangioma of skin and subcutaneous tissue: Secondary | ICD-10-CM | POA: Diagnosis not present

## 2020-03-20 DIAGNOSIS — Z9889 Other specified postprocedural states: Secondary | ICD-10-CM | POA: Diagnosis not present

## 2020-03-20 DIAGNOSIS — X32XXXA Exposure to sunlight, initial encounter: Secondary | ICD-10-CM | POA: Diagnosis not present

## 2020-03-20 DIAGNOSIS — E119 Type 2 diabetes mellitus without complications: Secondary | ICD-10-CM | POA: Diagnosis not present

## 2020-03-20 DIAGNOSIS — L812 Freckles: Secondary | ICD-10-CM | POA: Diagnosis not present

## 2020-03-20 DIAGNOSIS — D649 Anemia, unspecified: Secondary | ICD-10-CM | POA: Diagnosis not present

## 2020-03-20 DIAGNOSIS — F1120 Opioid dependence, uncomplicated: Secondary | ICD-10-CM | POA: Diagnosis not present

## 2020-03-20 DIAGNOSIS — M1990 Unspecified osteoarthritis, unspecified site: Secondary | ICD-10-CM | POA: Diagnosis not present

## 2020-03-20 DIAGNOSIS — R1110 Vomiting, unspecified: Secondary | ICD-10-CM | POA: Diagnosis not present

## 2020-03-20 DIAGNOSIS — E1122 Type 2 diabetes mellitus with diabetic chronic kidney disease: Secondary | ICD-10-CM | POA: Diagnosis not present

## 2020-03-20 DIAGNOSIS — I5022 Chronic systolic (congestive) heart failure: Secondary | ICD-10-CM | POA: Diagnosis not present

## 2020-03-20 DIAGNOSIS — R5382 Chronic fatigue, unspecified: Secondary | ICD-10-CM | POA: Diagnosis not present

## 2020-03-20 DIAGNOSIS — N3020 Other chronic cystitis without hematuria: Secondary | ICD-10-CM | POA: Diagnosis not present

## 2020-03-20 DIAGNOSIS — E113511 Type 2 diabetes mellitus with proliferative diabetic retinopathy with macular edema, right eye: Secondary | ICD-10-CM | POA: Diagnosis not present

## 2020-03-20 DIAGNOSIS — H43813 Vitreous degeneration, bilateral: Secondary | ICD-10-CM | POA: Diagnosis not present

## 2020-03-20 DIAGNOSIS — J0100 Acute maxillary sinusitis, unspecified: Secondary | ICD-10-CM | POA: Diagnosis not present

## 2020-03-20 DIAGNOSIS — R293 Abnormal posture: Secondary | ICD-10-CM | POA: Diagnosis not present

## 2020-03-20 DIAGNOSIS — D2272 Melanocytic nevi of left lower limb, including hip: Secondary | ICD-10-CM | POA: Diagnosis not present

## 2020-03-20 DIAGNOSIS — I69351 Hemiplegia and hemiparesis following cerebral infarction affecting right dominant side: Secondary | ICD-10-CM | POA: Diagnosis not present

## 2020-03-20 DIAGNOSIS — I63139 Cerebral infarction due to embolism of unspecified carotid artery: Secondary | ICD-10-CM | POA: Diagnosis not present

## 2020-03-20 DIAGNOSIS — I428 Other cardiomyopathies: Secondary | ICD-10-CM | POA: Diagnosis not present

## 2020-03-20 DIAGNOSIS — R001 Bradycardia, unspecified: Secondary | ICD-10-CM | POA: Diagnosis not present

## 2020-03-20 DIAGNOSIS — E291 Testicular hypofunction: Secondary | ICD-10-CM | POA: Diagnosis not present

## 2020-03-20 DIAGNOSIS — Z9989 Dependence on other enabling machines and devices: Secondary | ICD-10-CM | POA: Diagnosis not present

## 2020-03-20 DIAGNOSIS — I4819 Other persistent atrial fibrillation: Secondary | ICD-10-CM | POA: Diagnosis not present

## 2020-03-20 DIAGNOSIS — R21 Rash and other nonspecific skin eruption: Secondary | ICD-10-CM | POA: Diagnosis not present

## 2020-03-20 DIAGNOSIS — N3001 Acute cystitis with hematuria: Secondary | ICD-10-CM | POA: Diagnosis not present

## 2020-03-20 DIAGNOSIS — F79 Unspecified intellectual disabilities: Secondary | ICD-10-CM | POA: Diagnosis not present

## 2020-03-20 DIAGNOSIS — R140 Abdominal distension (gaseous): Secondary | ICD-10-CM | POA: Diagnosis not present

## 2020-03-20 DIAGNOSIS — I5033 Acute on chronic diastolic (congestive) heart failure: Secondary | ICD-10-CM | POA: Diagnosis not present

## 2020-03-20 DIAGNOSIS — J9600 Acute respiratory failure, unspecified whether with hypoxia or hypercapnia: Secondary | ICD-10-CM | POA: Diagnosis not present

## 2020-03-20 DIAGNOSIS — R269 Unspecified abnormalities of gait and mobility: Secondary | ICD-10-CM | POA: Diagnosis not present

## 2020-03-20 DIAGNOSIS — E876 Hypokalemia: Secondary | ICD-10-CM | POA: Diagnosis not present

## 2020-03-20 DIAGNOSIS — H2513 Age-related nuclear cataract, bilateral: Secondary | ICD-10-CM | POA: Diagnosis not present

## 2020-03-20 DIAGNOSIS — F4312 Post-traumatic stress disorder, chronic: Secondary | ICD-10-CM | POA: Diagnosis not present

## 2020-03-20 DIAGNOSIS — J301 Allergic rhinitis due to pollen: Secondary | ICD-10-CM | POA: Diagnosis not present

## 2020-03-20 DIAGNOSIS — R739 Hyperglycemia, unspecified: Secondary | ICD-10-CM | POA: Diagnosis not present

## 2020-03-20 DIAGNOSIS — H401211 Low-tension glaucoma, right eye, mild stage: Secondary | ICD-10-CM | POA: Diagnosis not present

## 2020-03-20 DIAGNOSIS — L309 Dermatitis, unspecified: Secondary | ICD-10-CM | POA: Diagnosis not present

## 2020-03-20 DIAGNOSIS — R1031 Right lower quadrant pain: Secondary | ICD-10-CM | POA: Diagnosis not present

## 2020-03-20 DIAGNOSIS — D2261 Melanocytic nevi of right upper limb, including shoulder: Secondary | ICD-10-CM | POA: Diagnosis not present

## 2020-03-20 DIAGNOSIS — H35033 Hypertensive retinopathy, bilateral: Secondary | ICD-10-CM | POA: Diagnosis not present

## 2020-03-20 DIAGNOSIS — R519 Headache, unspecified: Secondary | ICD-10-CM | POA: Diagnosis not present

## 2020-03-20 DIAGNOSIS — H43812 Vitreous degeneration, left eye: Secondary | ICD-10-CM | POA: Diagnosis not present

## 2020-03-20 DIAGNOSIS — M19012 Primary osteoarthritis, left shoulder: Secondary | ICD-10-CM | POA: Diagnosis not present

## 2020-03-20 DIAGNOSIS — I25119 Atherosclerotic heart disease of native coronary artery with unspecified angina pectoris: Secondary | ICD-10-CM | POA: Diagnosis not present

## 2020-03-20 DIAGNOSIS — J019 Acute sinusitis, unspecified: Secondary | ICD-10-CM | POA: Diagnosis not present

## 2020-03-20 DIAGNOSIS — S62622D Displaced fracture of medial phalanx of right middle finger, subsequent encounter for fracture with routine healing: Secondary | ICD-10-CM | POA: Diagnosis not present

## 2020-03-20 DIAGNOSIS — M25551 Pain in right hip: Secondary | ICD-10-CM | POA: Diagnosis not present

## 2020-03-20 DIAGNOSIS — Z95810 Presence of automatic (implantable) cardiac defibrillator: Secondary | ICD-10-CM | POA: Diagnosis not present

## 2020-03-20 DIAGNOSIS — T3177 Burns involving 70-79% of body surface with 70-79% third degree burns: Secondary | ICD-10-CM | POA: Diagnosis not present

## 2020-03-20 DIAGNOSIS — S39012A Strain of muscle, fascia and tendon of lower back, initial encounter: Secondary | ICD-10-CM | POA: Diagnosis not present

## 2020-03-20 DIAGNOSIS — N041 Nephrotic syndrome with focal and segmental glomerular lesions: Secondary | ICD-10-CM | POA: Diagnosis not present

## 2020-03-20 DIAGNOSIS — R7301 Impaired fasting glucose: Secondary | ICD-10-CM | POA: Diagnosis not present

## 2020-03-20 DIAGNOSIS — G4700 Insomnia, unspecified: Secondary | ICD-10-CM | POA: Diagnosis not present

## 2020-03-20 DIAGNOSIS — M461 Sacroiliitis, not elsewhere classified: Secondary | ICD-10-CM | POA: Diagnosis not present

## 2020-03-20 DIAGNOSIS — Q231 Congenital insufficiency of aortic valve: Secondary | ICD-10-CM | POA: Diagnosis not present

## 2020-03-20 DIAGNOSIS — Z6832 Body mass index (BMI) 32.0-32.9, adult: Secondary | ICD-10-CM | POA: Diagnosis not present

## 2020-03-20 DIAGNOSIS — M154 Erosive (osteo)arthritis: Secondary | ICD-10-CM | POA: Diagnosis not present

## 2020-03-20 DIAGNOSIS — Z23 Encounter for immunization: Secondary | ICD-10-CM | POA: Diagnosis not present

## 2020-03-20 DIAGNOSIS — H6123 Impacted cerumen, bilateral: Secondary | ICD-10-CM | POA: Diagnosis not present

## 2020-03-20 DIAGNOSIS — H5203 Hypermetropia, bilateral: Secondary | ICD-10-CM | POA: Diagnosis not present

## 2020-03-20 DIAGNOSIS — C61 Malignant neoplasm of prostate: Secondary | ICD-10-CM | POA: Diagnosis not present

## 2020-03-20 DIAGNOSIS — H26492 Other secondary cataract, left eye: Secondary | ICD-10-CM | POA: Diagnosis not present

## 2020-03-20 DIAGNOSIS — H25093 Other age-related incipient cataract, bilateral: Secondary | ICD-10-CM | POA: Diagnosis not present

## 2020-03-20 DIAGNOSIS — Z1152 Encounter for screening for COVID-19: Secondary | ICD-10-CM | POA: Diagnosis not present

## 2020-03-20 DIAGNOSIS — Z87891 Personal history of nicotine dependence: Secondary | ICD-10-CM | POA: Diagnosis not present

## 2020-03-20 DIAGNOSIS — Z20828 Contact with and (suspected) exposure to other viral communicable diseases: Secondary | ICD-10-CM | POA: Diagnosis not present

## 2020-03-20 DIAGNOSIS — Z6825 Body mass index (BMI) 25.0-25.9, adult: Secondary | ICD-10-CM | POA: Diagnosis not present

## 2020-03-20 DIAGNOSIS — R609 Edema, unspecified: Secondary | ICD-10-CM | POA: Diagnosis not present

## 2020-03-20 DIAGNOSIS — M778 Other enthesopathies, not elsewhere classified: Secondary | ICD-10-CM | POA: Diagnosis not present

## 2020-03-20 DIAGNOSIS — B356 Tinea cruris: Secondary | ICD-10-CM | POA: Diagnosis not present

## 2020-03-20 DIAGNOSIS — Z6834 Body mass index (BMI) 34.0-34.9, adult: Secondary | ICD-10-CM | POA: Diagnosis not present

## 2020-03-20 DIAGNOSIS — R7989 Other specified abnormal findings of blood chemistry: Secondary | ICD-10-CM | POA: Diagnosis not present

## 2020-03-20 DIAGNOSIS — M961 Postlaminectomy syndrome, not elsewhere classified: Secondary | ICD-10-CM | POA: Diagnosis not present

## 2020-03-20 DIAGNOSIS — J9691 Respiratory failure, unspecified with hypoxia: Secondary | ICD-10-CM | POA: Diagnosis not present

## 2020-03-20 DIAGNOSIS — E871 Hypo-osmolality and hyponatremia: Secondary | ICD-10-CM | POA: Diagnosis not present

## 2020-03-20 DIAGNOSIS — I441 Atrioventricular block, second degree: Secondary | ICD-10-CM | POA: Diagnosis not present

## 2020-03-20 DIAGNOSIS — M47816 Spondylosis without myelopathy or radiculopathy, lumbar region: Secondary | ICD-10-CM | POA: Diagnosis not present

## 2020-03-20 DIAGNOSIS — Z1231 Encounter for screening mammogram for malignant neoplasm of breast: Secondary | ICD-10-CM | POA: Diagnosis not present

## 2020-03-20 DIAGNOSIS — M5441 Lumbago with sciatica, right side: Secondary | ICD-10-CM | POA: Diagnosis not present

## 2020-03-20 DIAGNOSIS — D225 Melanocytic nevi of trunk: Secondary | ICD-10-CM | POA: Diagnosis not present

## 2020-03-20 DIAGNOSIS — K317 Polyp of stomach and duodenum: Secondary | ICD-10-CM | POA: Diagnosis not present

## 2020-03-20 DIAGNOSIS — M7581 Other shoulder lesions, right shoulder: Secondary | ICD-10-CM | POA: Diagnosis not present

## 2020-03-20 DIAGNOSIS — I209 Angina pectoris, unspecified: Secondary | ICD-10-CM | POA: Diagnosis not present

## 2020-03-20 DIAGNOSIS — Z6830 Body mass index (BMI) 30.0-30.9, adult: Secondary | ICD-10-CM | POA: Diagnosis not present

## 2020-03-20 DIAGNOSIS — Z1211 Encounter for screening for malignant neoplasm of colon: Secondary | ICD-10-CM | POA: Diagnosis not present

## 2020-03-20 DIAGNOSIS — F309 Manic episode, unspecified: Secondary | ICD-10-CM | POA: Diagnosis not present

## 2020-03-20 DIAGNOSIS — E1149 Type 2 diabetes mellitus with other diabetic neurological complication: Secondary | ICD-10-CM | POA: Diagnosis not present

## 2020-03-20 DIAGNOSIS — Z6820 Body mass index (BMI) 20.0-20.9, adult: Secondary | ICD-10-CM | POA: Diagnosis not present

## 2020-03-20 DIAGNOSIS — I4892 Unspecified atrial flutter: Secondary | ICD-10-CM | POA: Diagnosis not present

## 2020-03-20 DIAGNOSIS — I4891 Unspecified atrial fibrillation: Secondary | ICD-10-CM | POA: Diagnosis not present

## 2020-03-20 DIAGNOSIS — R55 Syncope and collapse: Secondary | ICD-10-CM | POA: Diagnosis not present

## 2020-03-20 DIAGNOSIS — I63511 Cerebral infarction due to unspecified occlusion or stenosis of right middle cerebral artery: Secondary | ICD-10-CM | POA: Diagnosis not present

## 2020-03-20 DIAGNOSIS — M47812 Spondylosis without myelopathy or radiculopathy, cervical region: Secondary | ICD-10-CM | POA: Diagnosis not present

## 2020-03-20 DIAGNOSIS — Z139 Encounter for screening, unspecified: Secondary | ICD-10-CM | POA: Diagnosis not present

## 2020-03-20 DIAGNOSIS — M5440 Lumbago with sciatica, unspecified side: Secondary | ICD-10-CM | POA: Diagnosis not present

## 2020-03-20 DIAGNOSIS — I255 Ischemic cardiomyopathy: Secondary | ICD-10-CM | POA: Diagnosis not present

## 2020-03-20 DIAGNOSIS — I482 Chronic atrial fibrillation, unspecified: Secondary | ICD-10-CM | POA: Diagnosis not present

## 2020-03-20 DIAGNOSIS — N2581 Secondary hyperparathyroidism of renal origin: Secondary | ICD-10-CM | POA: Diagnosis not present

## 2020-03-20 DIAGNOSIS — N184 Chronic kidney disease, stage 4 (severe): Secondary | ICD-10-CM | POA: Diagnosis not present

## 2020-03-20 DIAGNOSIS — F17210 Nicotine dependence, cigarettes, uncomplicated: Secondary | ICD-10-CM | POA: Diagnosis not present

## 2020-03-20 DIAGNOSIS — R2689 Other abnormalities of gait and mobility: Secondary | ICD-10-CM | POA: Diagnosis not present

## 2020-03-20 DIAGNOSIS — F4322 Adjustment disorder with anxiety: Secondary | ICD-10-CM | POA: Diagnosis not present

## 2020-03-20 DIAGNOSIS — M5442 Lumbago with sciatica, left side: Secondary | ICD-10-CM | POA: Diagnosis not present

## 2020-03-20 DIAGNOSIS — U071 COVID-19: Secondary | ICD-10-CM | POA: Diagnosis not present

## 2020-03-20 DIAGNOSIS — J986 Disorders of diaphragm: Secondary | ICD-10-CM | POA: Diagnosis not present

## 2020-03-20 DIAGNOSIS — G43109 Migraine with aura, not intractable, without status migrainosus: Secondary | ICD-10-CM | POA: Diagnosis not present

## 2020-03-20 DIAGNOSIS — D2262 Melanocytic nevi of left upper limb, including shoulder: Secondary | ICD-10-CM | POA: Diagnosis not present

## 2020-03-20 DIAGNOSIS — J1282 Pneumonia due to coronavirus disease 2019: Secondary | ICD-10-CM | POA: Diagnosis not present

## 2020-03-20 DIAGNOSIS — Z01818 Encounter for other preprocedural examination: Secondary | ICD-10-CM | POA: Diagnosis not present

## 2020-03-20 DIAGNOSIS — R062 Wheezing: Secondary | ICD-10-CM | POA: Diagnosis not present

## 2020-03-20 DIAGNOSIS — R350 Frequency of micturition: Secondary | ICD-10-CM | POA: Diagnosis not present

## 2020-03-20 DIAGNOSIS — F6381 Intermittent explosive disorder: Secondary | ICD-10-CM | POA: Diagnosis not present

## 2020-03-20 DIAGNOSIS — G2581 Restless legs syndrome: Secondary | ICD-10-CM | POA: Diagnosis not present

## 2020-03-20 DIAGNOSIS — M79674 Pain in right toe(s): Secondary | ICD-10-CM | POA: Diagnosis not present

## 2020-03-20 DIAGNOSIS — F331 Major depressive disorder, recurrent, moderate: Secondary | ICD-10-CM | POA: Diagnosis not present

## 2020-03-20 DIAGNOSIS — N400 Enlarged prostate without lower urinary tract symptoms: Secondary | ICD-10-CM | POA: Diagnosis not present

## 2020-03-20 DIAGNOSIS — E559 Vitamin D deficiency, unspecified: Secondary | ICD-10-CM | POA: Diagnosis not present

## 2020-03-20 DIAGNOSIS — Z4789 Encounter for other orthopedic aftercare: Secondary | ICD-10-CM | POA: Diagnosis not present

## 2020-03-20 DIAGNOSIS — F909 Attention-deficit hyperactivity disorder, unspecified type: Secondary | ICD-10-CM | POA: Diagnosis not present

## 2020-03-20 DIAGNOSIS — I6521 Occlusion and stenosis of right carotid artery: Secondary | ICD-10-CM | POA: Diagnosis not present

## 2020-03-20 DIAGNOSIS — I25708 Atherosclerosis of coronary artery bypass graft(s), unspecified, with other forms of angina pectoris: Secondary | ICD-10-CM | POA: Diagnosis not present

## 2020-03-20 DIAGNOSIS — M4126 Other idiopathic scoliosis, lumbar region: Secondary | ICD-10-CM | POA: Diagnosis not present

## 2020-03-20 DIAGNOSIS — M1711 Unilateral primary osteoarthritis, right knee: Secondary | ICD-10-CM | POA: Diagnosis not present

## 2020-03-20 DIAGNOSIS — F3181 Bipolar II disorder: Secondary | ICD-10-CM | POA: Diagnosis not present

## 2020-03-20 DIAGNOSIS — K580 Irritable bowel syndrome with diarrhea: Secondary | ICD-10-CM | POA: Diagnosis not present

## 2020-03-20 DIAGNOSIS — I484 Atypical atrial flutter: Secondary | ICD-10-CM | POA: Diagnosis not present

## 2020-03-20 DIAGNOSIS — I447 Left bundle-branch block, unspecified: Secondary | ICD-10-CM | POA: Diagnosis not present

## 2020-03-20 DIAGNOSIS — M1A9XX0 Chronic gout, unspecified, without tophus (tophi): Secondary | ICD-10-CM | POA: Diagnosis not present

## 2020-03-20 DIAGNOSIS — F329 Major depressive disorder, single episode, unspecified: Secondary | ICD-10-CM | POA: Diagnosis not present

## 2020-03-20 DIAGNOSIS — I739 Peripheral vascular disease, unspecified: Secondary | ICD-10-CM | POA: Diagnosis not present

## 2020-03-20 DIAGNOSIS — Z8669 Personal history of other diseases of the nervous system and sense organs: Secondary | ICD-10-CM | POA: Diagnosis not present

## 2020-03-20 DIAGNOSIS — I519 Heart disease, unspecified: Secondary | ICD-10-CM | POA: Diagnosis not present

## 2020-03-20 DIAGNOSIS — I429 Cardiomyopathy, unspecified: Secondary | ICD-10-CM | POA: Diagnosis not present

## 2020-03-20 DIAGNOSIS — L218 Other seborrheic dermatitis: Secondary | ICD-10-CM | POA: Diagnosis not present

## 2020-03-20 DIAGNOSIS — M25561 Pain in right knee: Secondary | ICD-10-CM | POA: Diagnosis not present

## 2020-03-20 DIAGNOSIS — Z96651 Presence of right artificial knee joint: Secondary | ICD-10-CM | POA: Diagnosis not present

## 2020-03-20 DIAGNOSIS — M25579 Pain in unspecified ankle and joints of unspecified foot: Secondary | ICD-10-CM | POA: Diagnosis not present

## 2020-03-20 DIAGNOSIS — R0600 Dyspnea, unspecified: Secondary | ICD-10-CM | POA: Diagnosis not present

## 2020-03-20 DIAGNOSIS — H401131 Primary open-angle glaucoma, bilateral, mild stage: Secondary | ICD-10-CM | POA: Diagnosis not present

## 2020-03-20 DIAGNOSIS — H40013 Open angle with borderline findings, low risk, bilateral: Secondary | ICD-10-CM | POA: Diagnosis not present

## 2020-03-20 DIAGNOSIS — M0510 Rheumatoid lung disease with rheumatoid arthritis of unspecified site: Secondary | ICD-10-CM | POA: Diagnosis not present

## 2020-03-20 DIAGNOSIS — M25511 Pain in right shoulder: Secondary | ICD-10-CM | POA: Diagnosis not present

## 2020-03-20 DIAGNOSIS — H40053 Ocular hypertension, bilateral: Secondary | ICD-10-CM | POA: Diagnosis not present

## 2020-03-20 DIAGNOSIS — S82142A Displaced bicondylar fracture of left tibia, initial encounter for closed fracture: Secondary | ICD-10-CM | POA: Diagnosis not present

## 2020-03-20 DIAGNOSIS — Z85828 Personal history of other malignant neoplasm of skin: Secondary | ICD-10-CM | POA: Diagnosis not present

## 2020-03-20 DIAGNOSIS — R5383 Other fatigue: Secondary | ICD-10-CM | POA: Diagnosis not present

## 2020-03-20 DIAGNOSIS — M459 Ankylosing spondylitis of unspecified sites in spine: Secondary | ICD-10-CM | POA: Diagnosis not present

## 2020-03-20 DIAGNOSIS — Z961 Presence of intraocular lens: Secondary | ICD-10-CM | POA: Diagnosis not present

## 2020-03-20 DIAGNOSIS — I2510 Atherosclerotic heart disease of native coronary artery without angina pectoris: Secondary | ICD-10-CM | POA: Diagnosis not present

## 2020-03-20 DIAGNOSIS — N529 Male erectile dysfunction, unspecified: Secondary | ICD-10-CM | POA: Diagnosis not present

## 2020-03-20 DIAGNOSIS — M7989 Other specified soft tissue disorders: Secondary | ICD-10-CM | POA: Diagnosis not present

## 2020-03-20 DIAGNOSIS — F411 Generalized anxiety disorder: Secondary | ICD-10-CM | POA: Diagnosis not present

## 2020-03-20 DIAGNOSIS — R110 Nausea: Secondary | ICD-10-CM | POA: Diagnosis not present

## 2020-03-20 DIAGNOSIS — B351 Tinea unguium: Secondary | ICD-10-CM | POA: Diagnosis not present

## 2020-03-20 DIAGNOSIS — J84178 Other interstitial pulmonary diseases with fibrosis in diseases classified elsewhere: Secondary | ICD-10-CM | POA: Diagnosis not present

## 2020-03-20 DIAGNOSIS — Z85820 Personal history of malignant melanoma of skin: Secondary | ICD-10-CM | POA: Diagnosis not present

## 2020-03-20 DIAGNOSIS — M5432 Sciatica, left side: Secondary | ICD-10-CM | POA: Diagnosis not present

## 2020-03-20 DIAGNOSIS — N1831 Chronic kidney disease, stage 3a: Secondary | ICD-10-CM | POA: Diagnosis not present

## 2020-03-20 DIAGNOSIS — I2584 Coronary atherosclerosis due to calcified coronary lesion: Secondary | ICD-10-CM | POA: Diagnosis not present

## 2020-03-20 DIAGNOSIS — E042 Nontoxic multinodular goiter: Secondary | ICD-10-CM | POA: Diagnosis not present

## 2020-03-20 DIAGNOSIS — M353 Polymyalgia rheumatica: Secondary | ICD-10-CM | POA: Diagnosis not present

## 2020-03-20 DIAGNOSIS — F5101 Primary insomnia: Secondary | ICD-10-CM | POA: Diagnosis not present

## 2020-03-20 DIAGNOSIS — E782 Mixed hyperlipidemia: Secondary | ICD-10-CM | POA: Diagnosis not present

## 2020-03-20 DIAGNOSIS — N183 Chronic kidney disease, stage 3 unspecified: Secondary | ICD-10-CM | POA: Diagnosis not present

## 2020-03-20 DIAGNOSIS — M9902 Segmental and somatic dysfunction of thoracic region: Secondary | ICD-10-CM | POA: Diagnosis not present

## 2020-03-20 DIAGNOSIS — Z125 Encounter for screening for malignant neoplasm of prostate: Secondary | ICD-10-CM | POA: Diagnosis not present

## 2020-03-20 DIAGNOSIS — I129 Hypertensive chronic kidney disease with stage 1 through stage 4 chronic kidney disease, or unspecified chronic kidney disease: Secondary | ICD-10-CM | POA: Diagnosis not present

## 2020-03-20 DIAGNOSIS — M9904 Segmental and somatic dysfunction of sacral region: Secondary | ICD-10-CM | POA: Diagnosis not present

## 2020-03-20 DIAGNOSIS — L0291 Cutaneous abscess, unspecified: Secondary | ICD-10-CM | POA: Diagnosis not present

## 2020-03-20 DIAGNOSIS — S72001D Fracture of unspecified part of neck of right femur, subsequent encounter for closed fracture with routine healing: Secondary | ICD-10-CM | POA: Diagnosis not present

## 2020-03-20 DIAGNOSIS — E785 Hyperlipidemia, unspecified: Secondary | ICD-10-CM | POA: Diagnosis not present

## 2020-03-20 DIAGNOSIS — M0589 Other rheumatoid arthritis with rheumatoid factor of multiple sites: Secondary | ICD-10-CM | POA: Diagnosis not present

## 2020-03-20 DIAGNOSIS — Z6824 Body mass index (BMI) 24.0-24.9, adult: Secondary | ICD-10-CM | POA: Diagnosis not present

## 2020-03-20 DIAGNOSIS — D485 Neoplasm of uncertain behavior of skin: Secondary | ICD-10-CM | POA: Diagnosis not present

## 2020-03-20 DIAGNOSIS — E109 Type 1 diabetes mellitus without complications: Secondary | ICD-10-CM | POA: Diagnosis not present

## 2020-03-20 DIAGNOSIS — H18529 Epithelial (juvenile) corneal dystrophy, unspecified eye: Secondary | ICD-10-CM | POA: Diagnosis not present

## 2020-03-20 DIAGNOSIS — R300 Dysuria: Secondary | ICD-10-CM | POA: Diagnosis not present

## 2020-03-20 DIAGNOSIS — E7800 Pure hypercholesterolemia, unspecified: Secondary | ICD-10-CM | POA: Diagnosis not present

## 2020-03-20 DIAGNOSIS — H2511 Age-related nuclear cataract, right eye: Secondary | ICD-10-CM | POA: Diagnosis not present

## 2020-03-20 DIAGNOSIS — Z7952 Long term (current) use of systemic steroids: Secondary | ICD-10-CM | POA: Diagnosis not present

## 2020-03-20 DIAGNOSIS — Z1589 Genetic susceptibility to other disease: Secondary | ICD-10-CM | POA: Diagnosis not present

## 2020-03-20 DIAGNOSIS — M79671 Pain in right foot: Secondary | ICD-10-CM | POA: Diagnosis not present

## 2020-03-20 DIAGNOSIS — M19011 Primary osteoarthritis, right shoulder: Secondary | ICD-10-CM | POA: Diagnosis not present

## 2020-03-20 DIAGNOSIS — H31012 Macula scars of posterior pole (postinflammatory) (post-traumatic), left eye: Secondary | ICD-10-CM | POA: Diagnosis not present

## 2020-03-20 DIAGNOSIS — I4821 Permanent atrial fibrillation: Secondary | ICD-10-CM | POA: Diagnosis not present

## 2020-03-20 DIAGNOSIS — E790 Hyperuricemia without signs of inflammatory arthritis and tophaceous disease: Secondary | ICD-10-CM | POA: Diagnosis not present

## 2020-03-20 DIAGNOSIS — Z9622 Myringotomy tube(s) status: Secondary | ICD-10-CM | POA: Diagnosis not present

## 2020-03-20 DIAGNOSIS — J309 Allergic rhinitis, unspecified: Secondary | ICD-10-CM | POA: Diagnosis not present

## 2020-03-20 DIAGNOSIS — K641 Second degree hemorrhoids: Secondary | ICD-10-CM | POA: Diagnosis not present

## 2020-03-20 DIAGNOSIS — N200 Calculus of kidney: Secondary | ICD-10-CM | POA: Diagnosis not present

## 2020-03-20 DIAGNOSIS — G4733 Obstructive sleep apnea (adult) (pediatric): Secondary | ICD-10-CM | POA: Diagnosis not present

## 2020-03-20 DIAGNOSIS — J84112 Idiopathic pulmonary fibrosis: Secondary | ICD-10-CM | POA: Diagnosis not present

## 2020-03-20 DIAGNOSIS — M9903 Segmental and somatic dysfunction of lumbar region: Secondary | ICD-10-CM | POA: Diagnosis not present

## 2020-03-20 DIAGNOSIS — Z136 Encounter for screening for cardiovascular disorders: Secondary | ICD-10-CM | POA: Diagnosis not present

## 2020-03-20 DIAGNOSIS — I340 Nonrheumatic mitral (valve) insufficiency: Secondary | ICD-10-CM | POA: Diagnosis not present

## 2020-03-20 DIAGNOSIS — Z86711 Personal history of pulmonary embolism: Secondary | ICD-10-CM | POA: Diagnosis not present

## 2020-03-20 DIAGNOSIS — R319 Hematuria, unspecified: Secondary | ICD-10-CM | POA: Diagnosis not present

## 2020-03-20 DIAGNOSIS — Z6826 Body mass index (BMI) 26.0-26.9, adult: Secondary | ICD-10-CM | POA: Diagnosis not present

## 2020-03-20 DIAGNOSIS — G9009 Other idiopathic peripheral autonomic neuropathy: Secondary | ICD-10-CM | POA: Diagnosis not present

## 2020-03-20 DIAGNOSIS — N1832 Chronic kidney disease, stage 3b: Secondary | ICD-10-CM | POA: Diagnosis not present

## 2020-03-20 DIAGNOSIS — D369 Benign neoplasm, unspecified site: Secondary | ICD-10-CM | POA: Diagnosis not present

## 2020-03-20 DIAGNOSIS — I700 Atherosclerosis of aorta: Secondary | ICD-10-CM | POA: Diagnosis not present

## 2020-03-20 DIAGNOSIS — M8000XD Age-related osteoporosis with current pathological fracture, unspecified site, subsequent encounter for fracture with routine healing: Secondary | ICD-10-CM | POA: Diagnosis not present

## 2020-03-20 DIAGNOSIS — J209 Acute bronchitis, unspecified: Secondary | ICD-10-CM | POA: Diagnosis not present

## 2020-03-20 DIAGNOSIS — Z9861 Coronary angioplasty status: Secondary | ICD-10-CM | POA: Diagnosis not present

## 2020-03-20 DIAGNOSIS — E113512 Type 2 diabetes mellitus with proliferative diabetic retinopathy with macular edema, left eye: Secondary | ICD-10-CM | POA: Diagnosis not present

## 2020-03-20 DIAGNOSIS — R0602 Shortness of breath: Secondary | ICD-10-CM | POA: Diagnosis not present

## 2020-03-20 DIAGNOSIS — S82141A Displaced bicondylar fracture of right tibia, initial encounter for closed fracture: Secondary | ICD-10-CM | POA: Diagnosis not present

## 2020-03-20 DIAGNOSIS — M79606 Pain in leg, unspecified: Secondary | ICD-10-CM | POA: Diagnosis not present

## 2020-03-20 DIAGNOSIS — Z905 Acquired absence of kidney: Secondary | ICD-10-CM | POA: Diagnosis not present

## 2020-03-20 DIAGNOSIS — Z9181 History of falling: Secondary | ICD-10-CM | POA: Diagnosis not present

## 2020-03-20 DIAGNOSIS — Z87898 Personal history of other specified conditions: Secondary | ICD-10-CM | POA: Diagnosis not present

## 2020-03-20 DIAGNOSIS — E1142 Type 2 diabetes mellitus with diabetic polyneuropathy: Secondary | ICD-10-CM | POA: Diagnosis not present

## 2020-03-20 DIAGNOSIS — W19XXXA Unspecified fall, initial encounter: Secondary | ICD-10-CM | POA: Diagnosis not present

## 2020-03-20 DIAGNOSIS — M79672 Pain in left foot: Secondary | ICD-10-CM | POA: Diagnosis not present

## 2020-03-20 DIAGNOSIS — M6281 Muscle weakness (generalized): Secondary | ICD-10-CM | POA: Diagnosis not present

## 2020-03-20 DIAGNOSIS — M1712 Unilateral primary osteoarthritis, left knee: Secondary | ICD-10-CM | POA: Diagnosis not present

## 2020-03-20 DIAGNOSIS — K219 Gastro-esophageal reflux disease without esophagitis: Secondary | ICD-10-CM | POA: Diagnosis not present

## 2020-03-20 DIAGNOSIS — R002 Palpitations: Secondary | ICD-10-CM | POA: Diagnosis not present

## 2020-03-20 DIAGNOSIS — F330 Major depressive disorder, recurrent, mild: Secondary | ICD-10-CM | POA: Diagnosis not present

## 2020-03-20 DIAGNOSIS — Z6822 Body mass index (BMI) 22.0-22.9, adult: Secondary | ICD-10-CM | POA: Diagnosis not present

## 2020-03-20 DIAGNOSIS — Z720 Tobacco use: Secondary | ICD-10-CM | POA: Diagnosis not present

## 2020-03-20 DIAGNOSIS — E2740 Unspecified adrenocortical insufficiency: Secondary | ICD-10-CM | POA: Diagnosis not present

## 2020-03-20 DIAGNOSIS — H524 Presbyopia: Secondary | ICD-10-CM | POA: Diagnosis not present

## 2020-03-20 DIAGNOSIS — I480 Paroxysmal atrial fibrillation: Secondary | ICD-10-CM | POA: Diagnosis not present

## 2020-03-20 DIAGNOSIS — I25118 Atherosclerotic heart disease of native coronary artery with other forms of angina pectoris: Secondary | ICD-10-CM | POA: Diagnosis not present

## 2020-03-20 DIAGNOSIS — R2681 Unsteadiness on feet: Secondary | ICD-10-CM | POA: Diagnosis not present

## 2020-03-20 DIAGNOSIS — J302 Other seasonal allergic rhinitis: Secondary | ICD-10-CM | POA: Diagnosis not present

## 2020-03-20 DIAGNOSIS — E118 Type 2 diabetes mellitus with unspecified complications: Secondary | ICD-10-CM | POA: Diagnosis not present

## 2020-03-20 DIAGNOSIS — H9190 Unspecified hearing loss, unspecified ear: Secondary | ICD-10-CM | POA: Diagnosis not present

## 2020-03-20 DIAGNOSIS — E663 Overweight: Secondary | ICD-10-CM | POA: Diagnosis not present

## 2020-03-20 DIAGNOSIS — R6882 Decreased libido: Secondary | ICD-10-CM | POA: Diagnosis not present

## 2020-03-20 DIAGNOSIS — R42 Dizziness and giddiness: Secondary | ICD-10-CM | POA: Diagnosis not present

## 2020-03-20 DIAGNOSIS — N3090 Cystitis, unspecified without hematuria: Secondary | ICD-10-CM | POA: Diagnosis not present

## 2020-03-20 DIAGNOSIS — D090 Carcinoma in situ of bladder: Secondary | ICD-10-CM | POA: Diagnosis not present

## 2020-03-20 DIAGNOSIS — E538 Deficiency of other specified B group vitamins: Secondary | ICD-10-CM | POA: Diagnosis not present

## 2020-03-20 DIAGNOSIS — M810 Age-related osteoporosis without current pathological fracture: Secondary | ICD-10-CM | POA: Diagnosis not present

## 2020-03-20 DIAGNOSIS — E1140 Type 2 diabetes mellitus with diabetic neuropathy, unspecified: Secondary | ICD-10-CM | POA: Diagnosis not present

## 2020-03-20 DIAGNOSIS — A6920 Lyme disease, unspecified: Secondary | ICD-10-CM | POA: Diagnosis not present

## 2020-03-20 DIAGNOSIS — E039 Hypothyroidism, unspecified: Secondary | ICD-10-CM | POA: Diagnosis not present

## 2020-03-20 DIAGNOSIS — R791 Abnormal coagulation profile: Secondary | ICD-10-CM | POA: Diagnosis not present

## 2020-03-20 DIAGNOSIS — E038 Other specified hypothyroidism: Secondary | ICD-10-CM | POA: Diagnosis not present

## 2020-03-20 DIAGNOSIS — K2270 Barrett's esophagus without dysplasia: Secondary | ICD-10-CM | POA: Diagnosis not present

## 2020-03-20 DIAGNOSIS — M150 Primary generalized (osteo)arthritis: Secondary | ICD-10-CM | POA: Diagnosis not present

## 2020-03-20 DIAGNOSIS — F0281 Dementia in other diseases classified elsewhere with behavioral disturbance: Secondary | ICD-10-CM | POA: Diagnosis not present

## 2020-03-20 DIAGNOSIS — G894 Chronic pain syndrome: Secondary | ICD-10-CM | POA: Diagnosis not present

## 2020-03-20 DIAGNOSIS — M797 Fibromyalgia: Secondary | ICD-10-CM | POA: Diagnosis not present

## 2020-03-21 DIAGNOSIS — H35373 Puckering of macula, bilateral: Secondary | ICD-10-CM | POA: Diagnosis not present

## 2020-03-21 DIAGNOSIS — I10 Essential (primary) hypertension: Secondary | ICD-10-CM | POA: Diagnosis not present

## 2020-03-21 DIAGNOSIS — M810 Age-related osteoporosis without current pathological fracture: Secondary | ICD-10-CM | POA: Diagnosis not present

## 2020-03-21 DIAGNOSIS — E109 Type 1 diabetes mellitus without complications: Secondary | ICD-10-CM | POA: Diagnosis not present

## 2020-03-21 DIAGNOSIS — R05 Cough: Secondary | ICD-10-CM | POA: Diagnosis not present

## 2020-03-21 DIAGNOSIS — M7731 Calcaneal spur, right foot: Secondary | ICD-10-CM | POA: Diagnosis not present

## 2020-03-21 DIAGNOSIS — R262 Difficulty in walking, not elsewhere classified: Secondary | ICD-10-CM | POA: Diagnosis not present

## 2020-03-21 DIAGNOSIS — G5750 Tarsal tunnel syndrome, unspecified lower limb: Secondary | ICD-10-CM | POA: Diagnosis not present

## 2020-03-21 DIAGNOSIS — D2261 Melanocytic nevi of right upper limb, including shoulder: Secondary | ICD-10-CM | POA: Diagnosis not present

## 2020-03-21 DIAGNOSIS — Z6841 Body Mass Index (BMI) 40.0 and over, adult: Secondary | ICD-10-CM | POA: Diagnosis not present

## 2020-03-21 DIAGNOSIS — M545 Low back pain: Secondary | ICD-10-CM | POA: Diagnosis not present

## 2020-03-21 DIAGNOSIS — H5202 Hypermetropia, left eye: Secondary | ICD-10-CM | POA: Diagnosis not present

## 2020-03-21 DIAGNOSIS — M25579 Pain in unspecified ankle and joints of unspecified foot: Secondary | ICD-10-CM | POA: Diagnosis not present

## 2020-03-21 DIAGNOSIS — E1122 Type 2 diabetes mellitus with diabetic chronic kidney disease: Secondary | ICD-10-CM | POA: Diagnosis not present

## 2020-03-21 DIAGNOSIS — M48062 Spinal stenosis, lumbar region with neurogenic claudication: Secondary | ICD-10-CM | POA: Diagnosis not present

## 2020-03-21 DIAGNOSIS — M79672 Pain in left foot: Secondary | ICD-10-CM | POA: Diagnosis not present

## 2020-03-21 DIAGNOSIS — D2262 Melanocytic nevi of left upper limb, including shoulder: Secondary | ICD-10-CM | POA: Diagnosis not present

## 2020-03-21 DIAGNOSIS — M7732 Calcaneal spur, left foot: Secondary | ICD-10-CM | POA: Diagnosis not present

## 2020-03-21 DIAGNOSIS — C44221 Squamous cell carcinoma of skin of unspecified ear and external auricular canal: Secondary | ICD-10-CM | POA: Diagnosis not present

## 2020-03-21 DIAGNOSIS — D485 Neoplasm of uncertain behavior of skin: Secondary | ICD-10-CM | POA: Diagnosis not present

## 2020-03-21 DIAGNOSIS — Z6826 Body mass index (BMI) 26.0-26.9, adult: Secondary | ICD-10-CM | POA: Diagnosis not present

## 2020-03-21 DIAGNOSIS — D225 Melanocytic nevi of trunk: Secondary | ICD-10-CM | POA: Diagnosis not present

## 2020-03-21 DIAGNOSIS — H4720 Unspecified optic atrophy: Secondary | ICD-10-CM | POA: Diagnosis not present

## 2020-03-21 DIAGNOSIS — D3614 Benign neoplasm of peripheral nerves and autonomic nervous system of thorax: Secondary | ICD-10-CM | POA: Diagnosis not present

## 2020-03-21 DIAGNOSIS — E1139 Type 2 diabetes mellitus with other diabetic ophthalmic complication: Secondary | ICD-10-CM | POA: Diagnosis not present

## 2020-03-21 DIAGNOSIS — M65332 Trigger finger, left middle finger: Secondary | ICD-10-CM | POA: Diagnosis not present

## 2020-03-21 DIAGNOSIS — L538 Other specified erythematous conditions: Secondary | ICD-10-CM | POA: Diagnosis not present

## 2020-03-21 DIAGNOSIS — H353131 Nonexudative age-related macular degeneration, bilateral, early dry stage: Secondary | ICD-10-CM | POA: Diagnosis not present

## 2020-03-21 DIAGNOSIS — Z0001 Encounter for general adult medical examination with abnormal findings: Secondary | ICD-10-CM | POA: Diagnosis not present

## 2020-03-21 DIAGNOSIS — F419 Anxiety disorder, unspecified: Secondary | ICD-10-CM | POA: Diagnosis not present

## 2020-03-21 DIAGNOSIS — H40013 Open angle with borderline findings, low risk, bilateral: Secondary | ICD-10-CM | POA: Diagnosis not present

## 2020-03-21 DIAGNOSIS — H11159 Pinguecula, unspecified eye: Secondary | ICD-10-CM | POA: Diagnosis not present

## 2020-03-21 DIAGNOSIS — H524 Presbyopia: Secondary | ICD-10-CM | POA: Diagnosis not present

## 2020-03-21 DIAGNOSIS — N184 Chronic kidney disease, stage 4 (severe): Secondary | ICD-10-CM | POA: Diagnosis not present

## 2020-03-21 DIAGNOSIS — M542 Cervicalgia: Secondary | ICD-10-CM | POA: Diagnosis not present

## 2020-03-21 DIAGNOSIS — L570 Actinic keratosis: Secondary | ICD-10-CM | POA: Diagnosis not present

## 2020-03-21 DIAGNOSIS — H5000 Unspecified esotropia: Secondary | ICD-10-CM | POA: Diagnosis not present

## 2020-03-21 DIAGNOSIS — M0579 Rheumatoid arthritis with rheumatoid factor of multiple sites without organ or systems involvement: Secondary | ICD-10-CM | POA: Diagnosis not present

## 2020-03-21 DIAGNOSIS — L821 Other seborrheic keratosis: Secondary | ICD-10-CM | POA: Diagnosis not present

## 2020-03-21 DIAGNOSIS — M722 Plantar fascial fibromatosis: Secondary | ICD-10-CM | POA: Diagnosis not present

## 2020-03-21 DIAGNOSIS — M79671 Pain in right foot: Secondary | ICD-10-CM | POA: Diagnosis not present

## 2020-03-21 DIAGNOSIS — D2271 Melanocytic nevi of right lower limb, including hip: Secondary | ICD-10-CM | POA: Diagnosis not present

## 2020-03-21 DIAGNOSIS — M0600 Rheumatoid arthritis without rheumatoid factor, unspecified site: Secondary | ICD-10-CM | POA: Diagnosis not present

## 2020-03-21 DIAGNOSIS — L820 Inflamed seborrheic keratosis: Secondary | ICD-10-CM | POA: Diagnosis not present

## 2020-03-21 DIAGNOSIS — Z6833 Body mass index (BMI) 33.0-33.9, adult: Secondary | ICD-10-CM | POA: Diagnosis not present

## 2020-03-21 DIAGNOSIS — H25013 Cortical age-related cataract, bilateral: Secondary | ICD-10-CM | POA: Diagnosis not present

## 2020-03-21 DIAGNOSIS — E663 Overweight: Secondary | ICD-10-CM | POA: Diagnosis not present

## 2020-03-21 DIAGNOSIS — D2272 Melanocytic nevi of left lower limb, including hip: Secondary | ICD-10-CM | POA: Diagnosis not present

## 2020-03-21 DIAGNOSIS — X32XXXA Exposure to sunlight, initial encounter: Secondary | ICD-10-CM | POA: Diagnosis not present

## 2020-03-21 DIAGNOSIS — Z961 Presence of intraocular lens: Secondary | ICD-10-CM | POA: Diagnosis not present

## 2020-03-21 DIAGNOSIS — E785 Hyperlipidemia, unspecified: Secondary | ICD-10-CM | POA: Diagnosis not present

## 2020-03-22 DIAGNOSIS — F1120 Opioid dependence, uncomplicated: Secondary | ICD-10-CM | POA: Diagnosis not present

## 2020-03-28 DIAGNOSIS — F331 Major depressive disorder, recurrent, moderate: Secondary | ICD-10-CM | POA: Diagnosis not present

## 2020-03-29 DIAGNOSIS — H2510 Age-related nuclear cataract, unspecified eye: Secondary | ICD-10-CM | POA: Diagnosis not present

## 2020-03-29 DIAGNOSIS — F3342 Major depressive disorder, recurrent, in full remission: Secondary | ICD-10-CM | POA: Diagnosis not present

## 2020-06-14 ENCOUNTER — Telehealth (INDEPENDENT_AMBULATORY_CARE_PROVIDER_SITE_OTHER): Payer: No Payment, Other | Admitting: Psychiatric/Mental Health

## 2020-06-14 ENCOUNTER — Other Ambulatory Visit: Payer: Self-pay

## 2020-06-14 ENCOUNTER — Encounter (HOSPITAL_COMMUNITY): Payer: Self-pay | Admitting: Psychiatric/Mental Health

## 2020-06-14 DIAGNOSIS — F319 Bipolar disorder, unspecified: Secondary | ICD-10-CM | POA: Diagnosis not present

## 2020-06-14 DIAGNOSIS — F411 Generalized anxiety disorder: Secondary | ICD-10-CM

## 2020-06-14 MED ORDER — BUPROPION HCL ER (XL) 150 MG PO TB24: 150.0000 mg | ORAL_TABLET | ORAL | 3 refills | Status: DC

## 2020-06-14 MED ORDER — TRAZODONE HCL 50 MG PO TABS: 50.0000 mg | ORAL_TABLET | Freq: Every day | ORAL | 3 refills | Status: DC

## 2020-06-14 MED ORDER — LAMOTRIGINE 100 MG PO TABS: 100.0000 mg | ORAL_TABLET | Freq: Every day | ORAL | 3 refills | Status: DC

## 2020-06-14 MED ORDER — CARIPRAZINE HCL 1.5 MG PO CAPS: 1.5000 mg | ORAL_CAPSULE | Freq: Every day | ORAL | 3 refills | Status: DC

## 2020-06-14 MED ORDER — GABAPENTIN 400 MG PO CAPS: 400.0000 mg | ORAL_CAPSULE | Freq: Three times a day (TID) | ORAL | 3 refills | Status: DC

## 2020-06-14 NOTE — Progress Notes (Signed)
Psychiatric Initial Adult Assessment  Virtual Visit via Video Note  I connected with  Justino on 06/14/20  by a video enabled telemedicine application and verified that I am speaking with the correct person using two identifiers.  Location: Patient: Home Provider: Home office  I discussed the limitations of evaluation and management by telemedicine and the availability of in person appointments. The patient expressed understanding and agreed to proceed.  I provided 45 minutes of non-face-to-face time during this encounter.  Patient Identification: CHAWN SPRAGGINS MRN:  [id number] Date of Evaluation:  06/14/2020 Referral Source: Vesta Mixer Chief Complaint:  "I'm Feeling great" Visit Diagnosis:    ICD-10-CM   1. Bipolar I disorder (HCC)  F31.9 cariprazine (VRAYLAR) capsule    traZODone (DESYREL) 50 MG tablet    buPROPion (WELLBUTRIN XL) 150 MG 24 hr tablet    lamoTRIgine (LAMICTAL) 100 MG tablet  2. Anxiety state  F41.1 gabapentin (NEURONTIN) 400 MG capsule    buPROPion (WELLBUTRIN XL) 150 MG 24 hr tablet  George Brady is a 54 year old male seen today for initial psych evaluation.  Patient was referred to outpatient psychiatry by [facility] for medication management. On assessment Bowden states that everything is going well and that medications are continuing to work well for him. He denies SI, Hi and AVH. No futher concerns at this time.  Associated Signs/Symptoms: Depression Symptoms:  na (Hypo) Manic Symptoms:  na Anxiety Symptoms:  na Psychotic Symptoms:  na PTSD Symptoms: NA  Past Psychiatric History:   Previous Psychotropic Medications: No   Substance Abuse History in the last 12 months:  No.  Consequences of Substance Abuse: NA  Past Medical History:  Past Medical History:  Diagnosis Date  . Hepatitis C   . Manic depressive disorder [facility])     Past Surgical History:  Procedure Laterality Date  . ABDOMINAL SURGERY      Family Psychiatric History:   Family History:  History reviewed. No pertinent family history.  Social History:   Social History   Socioeconomic History  . Marital status: Married    Spouse name: Not on file  . Number of children: Not on file  . Years of education: Not on file  . Highest education level: Not on file  Occupational History  . Not on file  Tobacco Use  . Smoking status: Current Every Day Smoker    Packs/day: 0.50  . Smokeless tobacco: Never Used  Substance and Sexual Activity  . Alcohol use: Yes    Comment: occasional  . Drug use: Never  . Sexual activity: Not on file  Other Topics Concern  . Not on file  Social History Narrative  . Not on file   Social Determinants of Health   Financial Resource Strain:   . Difficulty of Paying Living Expenses:   Food Insecurity:   . Worried About Programme researcher, broadcasting/film/video in the Last Year:   . Barista in the Last Year:   Transportation Needs:   . Freight forwarder (Medical):   Marland Kitchen Lack of Transportation (Non-Medical):   Physical Activity:   . Days of Exercise per Week:   . Minutes of Exercise per Session:   Stress:   . Feeling of Stress :   Social Connections:   . Frequency of Communication with Friends and Family:   . Frequency of Social Gatherings with Friends and Family:   . Attends Religious Services:   . Active Member of Clubs or Organizations:   . Attends Club or  Organization Meetings:   Marland Kitchen Marital Status:     Additional Social History:   Allergies:  No Known Allergies  Metabolic Disorder Labs: No results found for: HGBA1C, MPG No results found for: PROLACTIN No results found for: CHOL, TRIG, HDL, CHOLHDL, VLDL, LDLCALC No results found for: TSH  Therapeutic Level Labs: No results found for: LITHIUM No results found for: CBMZ No results found for: VALPROATE  Current Medications: Current Outpatient Medications  Medication Sig Dispense Refill  . buPROPion (WELLBUTRIN XL) 150 MG 24 hr tablet Take 1 tablet (150 mg total) by mouth every  morning. 30 tablet 3  . cariprazine (VRAYLAR) capsule Take 1 capsule (1.5 mg total) by mouth daily. 30 capsule 3  . cyclobenzaprine (FLEXERIL) 10 MG tablet Take 1 tablet (10 mg total) by mouth 2 (two) times daily as needed for muscle spasms. 20 tablet 0  . gabapentin (NEURONTIN) 400 MG capsule Take 1 capsule (400 mg total) by mouth 3 (three) times daily. 90 capsule 3  . HYDROcodone-acetaminophen (NORCO/VICODIN) 5-325 MG tablet Take 1-2 tablets by mouth every 6 (six) hours as needed. 6 tablet 0  . lamoTRIgine (LAMICTAL) 100 MG tablet Take 1 tablet (100 mg total) by mouth daily. 30 tablet 3  . nitrofurantoin, macrocrystal-monohydrate, (MACROBID) 100 MG capsule Take 1 capsule (100 mg total) by mouth 2 (two) times daily. 14 capsule 0  . traZODone (DESYREL) 50 MG tablet Take 1 tablet (50 mg total) by mouth at bedtime. 30 tablet 3   No current facility-administered medications for this visit.    Musculoskeletal: Strength & Muscle Tone: within normal limits Gait & Station: normal Patient leans: N/A  Psychiatric Specialty Exam: Review of Systems  There were no vitals taken for this visit.There is no height or weight on file to calculate BMI.  General Appearance: NA  Eye Contact:  Good  Speech:  Clear and Coherent  Volume:  Normal  Mood:  Euthymic  Affect:  Congruent  Thought Process:  Coherent and Descriptions of Associations: Intact  Orientation:  Full (Time, Place, and Person)  Thought Content:  WDL  Suicidal Thoughts:  No  Homicidal Thoughts:  No  Memory:  Immediate;   Fair  Judgement:  Good  Insight:  Good  Psychomotor Activity:  Normal  Concentration:  Concentration: Good  Recall:  Good  Fund of Knowledge:Good  Language: Good  Akathisia:  NA  Handed:  Right  AIMS (if indicated):  not done  Assets:  Communication Skills Desire for Improvement Financial Resources/Insurance Intimacy Leisure Time Social Support  ADL's:  Intact  Cognition: WNL  Sleep:  Good    Screenings:   Assessment and Plan: continue with medication regimen and follow -up I 12 weeks.      ICD-10-CM   1. Bipolar I disorder (HCC)  F31.9 cariprazine (VRAYLAR) capsule    traZODone (DESYREL) 50 MG tablet    buPROPion (WELLBUTRIN XL) 150 MG 24 hr tablet    lamoTRIgine (LAMICTAL) 100 MG tablet  2. Anxiety state  F41.1 gabapentin (NEURONTIN) 400 MG capsule    buPROPion (WELLBUTRIN XL) 150 MG 24 hr tablet    Jearld Lesch, NP 7/28/20218:40 AM

## 2020-08-17 ENCOUNTER — Telehealth (HOSPITAL_COMMUNITY): Payer: Self-pay | Admitting: *Deleted

## 2020-08-17 NOTE — Telephone Encounter (Signed)
Call from patient needing his medicines. Reviewed his chart, all of his meds are available at [place], they all have refills. Reminded him of his appt on 10/12 with provider. He is not able to due my chart, states none of his passwords work, and his car is broke down and cant come in. Front desk staff will call him back and help him set up my chart because he is seeing Molli Knock NP and not sure how she works to know if she can send him a link.

## 2020-08-29 ENCOUNTER — Telehealth (HOSPITAL_COMMUNITY): Payer: No Payment, Other

## 2020-08-29 ENCOUNTER — Other Ambulatory Visit: Payer: Self-pay

## 2020-09-07 ENCOUNTER — Other Ambulatory Visit (HOSPITAL_COMMUNITY): Payer: Self-pay | Admitting: Physician Assistant

## 2020-09-07 ENCOUNTER — Encounter (HOSPITAL_COMMUNITY): Payer: Self-pay | Admitting: Physician Assistant

## 2020-09-07 ENCOUNTER — Other Ambulatory Visit: Payer: Self-pay

## 2020-09-07 ENCOUNTER — Ambulatory Visit (INDEPENDENT_AMBULATORY_CARE_PROVIDER_SITE_OTHER): Payer: No Payment, Other | Admitting: Physician Assistant

## 2020-09-07 VITALS — BP 107/68 | HR 70 | Ht 68.0 in | Wt 148.0 lb

## 2020-09-07 DIAGNOSIS — F319 Bipolar disorder, unspecified: Secondary | ICD-10-CM

## 2020-09-07 DIAGNOSIS — F411 Generalized anxiety disorder: Secondary | ICD-10-CM | POA: Diagnosis not present

## 2020-09-07 DIAGNOSIS — G4700 Insomnia, unspecified: Secondary | ICD-10-CM | POA: Diagnosis not present

## 2020-09-07 MED ORDER — LAMOTRIGINE 100 MG PO TABS: 100.0000 mg | ORAL_TABLET | Freq: Every day | ORAL | 1 refills | Status: DC

## 2020-09-07 MED ORDER — CARIPRAZINE HCL 1.5 MG PO CAPS: 1.5000 mg | ORAL_CAPSULE | Freq: Every day | ORAL | 1 refills | Status: DC

## 2020-09-07 MED ORDER — BUPROPION HCL ER (XL) 150 MG PO TB24: 150.0000 mg | ORAL_TABLET | ORAL | 1 refills | Status: DC

## 2020-09-07 MED ORDER — TRAZODONE HCL 50 MG PO TABS: 50.0000 mg | ORAL_TABLET | Freq: Every day | ORAL | 1 refills | Status: DC

## 2020-09-07 MED ORDER — GABAPENTIN 400 MG PO CAPS: 400.0000 mg | ORAL_CAPSULE | Freq: Three times a day (TID) | ORAL | 1 refills | Status: DC

## 2020-09-07 MED FILL — lamoTRIgine 100 MG TABS: 100 | 30 days supply | Qty: 30 | Fill #0

## 2020-09-07 MED FILL — VRAYLAR 1.5 MG CAPSULE: 1.5 | 30 days supply | Qty: 30 | Fill #0

## 2020-09-07 MED FILL — GABAPENTIN 400 MG CAPSULE: 400 | 30 days supply | Qty: 90 | Fill #0

## 2020-09-08 MED FILL — ?TRAZODONE HCL 50 TABS: 50 | 30 days supply | Qty: 30 | Fill #0

## 2020-09-08 MED FILL — ?BUPROPION HCL XL 150 MG TA: 150 | 30 days supply | Qty: 30 | Fill #0

## 2020-09-11 ENCOUNTER — Encounter (HOSPITAL_COMMUNITY): Payer: Self-pay | Admitting: Physician Assistant

## 2020-09-11 NOTE — Progress Notes (Signed)
Psychiatric Initial Adult Assessment   Patient Identification: George Brady MRN:  [id number] Date of Evaluation:  09/11/2020 Referral Source: N/A Chief Complaint: "Finally, for once I'm getting in touch with you. I'm here to continue my medications." Chief Complaint    Medication Management     Visit Diagnosis:    ICD-10-CM   1. Anxiety state  F41.1 buPROPion (WELLBUTRIN XL) 150 MG 24 hr tablet    gabapentin (NEURONTIN) 400 MG capsule  2. Bipolar I disorder (HCC)  F31.9 buPROPion (WELLBUTRIN XL) 150 MG 24 hr tablet    cariprazine (VRAYLAR) capsule    lamoTRIgine (LAMICTAL) 100 MG tablet    traZODone (DESYREL) 50 MG tablet  3. Insomnia, unspecified type  G47.00     History of Present Illness:   George Brady is a 54 year-old, male with past psychiatric history significant for anxiety, bipolar I disorder, and insomnia who presents to [facility] for "Finally, for once I'm getting in touch with you. I'm here to continue my medications." Patient is currently taking the following medications:  Gabapentin 400 mg 3 times daily Wellbutrin 150 mg 1 time daily Vraylar 1.5 mg 1 time daily Lamictal 100 mg 1 time daily Trazodone 50 mg 1 time daily  Patient states that he has been steady and stable while on this regimen of medications. Patient reports having mostly good days while on these medications. Patient reports having a great mood. Patient states that he is very relieved that he was able to find out what was wrong with him psychiatrically but said it was also a rude awakening for him because he had to internally deal with all the bad relationships from his past that may have been a direct result of his psychiatric illnesses. Patient is in a pleasant mood as he talks about hobbies and his history with drug abuse. 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  Patient denies suicide and homicide ideation. Patient further denies auditory and visual hallucinations. Patient reports good sleep and gets approximately 6 - 7 hours. Patient states he is well rested upon waking. Patient endorses poor weight and reports that he has been losing weight. Patient thinks it is due to pancreatic insufficiency because he can't seem to gain weight and has been experiencing oil diarrhea. Patient states that food has also lost it's taste and texture since recovering from covid-19. Patient endorses drinking beer occasionally. He hasn't been drunk in 10 - 15 years. Patient smokes a pack a day. 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  Associated Signs/Symptoms: Depression Symptoms:  difficulty concentrating, anxiety, loss of energy/fatigue, decreased appetite, (Hypo) Manic Symptoms:  Flight of Ideas, Grandiosity, Anxiety Symptoms:  Excessive Worry, Patient recalls having a real bad anxiety attack when his boss told him to build a wall without any context or further instruction on how to proceed Psychotic Symptoms:  N/A PTSD Symptoms: Re-experiencing:  Flashbacks  Past Psychiatric History: Anxiety State Bipolar Disorder I Insomnia  Previous Psychotropic Medications: Yes   Substance Abuse History in the last 12 months:  Yes.    Consequences of Substance Abuse: NA  Past Medical History:  Past Medical History:  Diagnosis Date  . Hepatitis C   . Manic depressive disorder [facility])     Past Surgical History:  Procedure Laterality Date  . ABDOMINAL SURGERY  Family Psychiatric History: Patient does not know if there are psychiatric illnesses on his mom's side  Family History: No family history on file.  Social History:   Social History   Socioeconomic History  . Marital status: Married    Spouse name: Not on file  . Number of children: Not on file  . Years of education: Not on file  . Highest education  level: Not on file  Occupational History  . Not on file  Tobacco Use  . Smoking status: Current Every Day Smoker    Packs/day: 0.50  . Smokeless tobacco: Never Used  Substance and Sexual Activity  . Alcohol use: Yes    Comment: occasional  . Drug use: Never  . Sexual activity: Not on file  Other Topics Concern  . Not on file  Social History Narrative  . Not on file   Social Determinants of Health   Financial Resource Strain:   . Difficulty of Paying Living Expenses: Not on file  Food Insecurity:   . Worried About Programme researcher, broadcasting/film/video in the Last Year: Not on file  . Ran Out of Food in the Last Year: Not on file  Transportation Needs:   . Lack of Transportation (Medical): Not on file  . Lack of Transportation (Non-Medical): Not on file  Physical Activity:   . Days of Exercise per Week: Not on file  . Minutes of Exercise per Session: Not on file  Stress:   . Feeling of Stress : Not on file  Social Connections:   . Frequency of Communication with Friends and Family: Not on file  . Frequency of Social Gatherings with Friends and Family: Not on file  . Attends Religious Services: Not on file  . Active Member of Clubs or Organizations: Not on file  . Attends Banker Meetings: Not on file  . Marital Status: Not on file    Additional Social History: Patient is in a band Patient works in [employer]  Allergies:  No Known Allergies  Metabolic Disorder Labs: No results found for: HGBA1C, MPG No results found for: PROLACTIN No results found for: CHOL, TRIG, HDL, CHOLHDL, VLDL, LDLCALC No results found for: TSH  Therapeutic Level Labs: No results found for: LITHIUM No results found for: CBMZ No results found for: VALPROATE  Current Medications: Current Outpatient Medications  Medication Sig Dispense Refill  . buPROPion (WELLBUTRIN XL) 150 MG 24 hr tablet Take 1 tablet (150 mg total) by mouth every morning. 30 tablet 1  . cariprazine (VRAYLAR) capsule Take  1 capsule (1.5 mg total) by mouth daily. 30 capsule 1  . gabapentin (NEURONTIN) 400 MG capsule Take 1 capsule (400 mg total) by mouth 3 (three) times daily. 90 capsule 1  . lamoTRIgine (LAMICTAL) 100 MG tablet Take 1 tablet (100 mg total) by mouth daily. 30 tablet 1  . traZODone (DESYREL) 50 MG tablet Take 1 tablet (50 mg total) by mouth at bedtime. 30 tablet 1   No current facility-administered medications for this visit.    Musculoskeletal: Strength & Muscle Tone: within normal limits Gait & Station: normal Patient leans: N/A  Psychiatric Specialty Exam: Review of Systems  Constitutional: Positive for activity change, appetite change and fatigue.  HENT: Negative.   Eyes: Negative.   Respiratory: Negative.   Cardiovascular: Negative.   Gastrointestinal: Positive for diarrhea (Patient endorses oil stools).  Endocrine: Negative.   Musculoskeletal: Negative.   Skin: Negative.   Neurological: Negative.   Psychiatric/Behavioral: Positive for decreased concentration. Negative  for hallucinations, sleep disturbance and suicidal ideas. The patient is nervous/anxious. The patient is not hyperactive.     Blood pressure 107/68, pulse 70, height 5\' 8"  (1.727 m), weight 148 lb (67.1 kg), SpO2 95 %.Body mass index is 22.5 kg/m.  General Appearance: Well Groomed  Eye Contact:  Good  Speech:  Clear and Coherent and Normal Rate  Volume:  Normal  Mood:  Patient was quite pleasant  Affect:  Appropriate  Thought Process:  Coherent and Goal Directed  Orientation:  Full (Time, Place, and Person)  Thought Content:  Logical  Suicidal Thoughts:  No  Homicidal Thoughts:  No  Memory:  Immediate;   Good Recent;   Good Remote;   Good  Judgement:  Good  Insight:  Good  Psychomotor Activity:  Normal  Concentration:  Concentration: Good and Attention Span: Good  Recall:  Good  Fund of Knowledge:Good  Language: Good  Akathisia:  Negative  Handed:  Right  AIMS (if indicated):  not done  Assets:   Communication Skills Desire for Improvement Vocational/Educational  ADL's:  Intact  Cognition: WNL  Sleep:  Good   Screenings:   Assessment and Plan:  George Brady is a 54 year-old, male with past psychiatric history significant for anxiety, bipolar I disorder, and insomnia who presents to [facility] for "Finally, for once I'm getting in touch with you. I'm here to continue my medications." Patient is currently taking the following medications:  Gabapentin 400 mg 3 times daily Wellbutrin 150 mg 1 time daily Vraylar 1.5 mg 1 time daily Lamictal 100 mg 1 time daily Trazodone 50 mg 1 time daily  Patient reports no issue with the medication and doesn't need any dosage adjustments. Patient request refills on his medications. Refills will be provided upon discharge.  1. Anxiety state  - buPROPion (WELLBUTRIN XL) 150 MG 24 hr tablet; Take 1 tablet (150 mg total) by mouth every morning.  Dispense: 30 tablet; Refill: 1 - gabapentin (NEURONTIN) 400 MG capsule; Take 1 capsule (400 mg total) by mouth 3 (three) times daily.  Dispense: 90 capsule; Refill: 1  2. Bipolar I disorder (HCC)  - buPROPion (WELLBUTRIN XL) 150 MG 24 hr tablet; Take 1 tablet (150 mg total) by mouth every morning.  Dispense: 30 tablet; Refill: 1  - cariprazine (VRAYLAR) capsule; Take 1 capsule (1.5 mg total) by mouth daily.  Dispense: 30 capsule; Refill: 1  - lamoTRIgine (LAMICTAL) 100 MG tablet; Take 1 tablet (100 mg total) by mouth daily.  Dispense: 30 tablet; Refill: 1   3. Insomnia, unspecified type  - traZODone (DESYREL) 50 MG tablet; Take 1 tablet (50 mg total) by mouth at bedtime.  Dispense: 30 tablet; Refill: 1   [facility], [facility] 10/25/202112:53 AM

## 2020-09-13 ENCOUNTER — Telehealth (HOSPITAL_COMMUNITY): Payer: No Payment, Other

## 2020-10-19 ENCOUNTER — Encounter (HOSPITAL_COMMUNITY): Payer: Self-pay | Admitting: Physician Assistant

## 2020-10-19 ENCOUNTER — Ambulatory Visit (INDEPENDENT_AMBULATORY_CARE_PROVIDER_SITE_OTHER): Payer: No Payment, Other | Admitting: Physician Assistant

## 2020-10-19 ENCOUNTER — Other Ambulatory Visit (HOSPITAL_COMMUNITY): Payer: Self-pay | Admitting: Physician Assistant

## 2020-10-19 ENCOUNTER — Other Ambulatory Visit: Payer: Self-pay

## 2020-10-19 VITALS — BP 111/90 | HR 98 | Temp 98.5°F | Ht 68.0 in

## 2020-10-19 DIAGNOSIS — F319 Bipolar disorder, unspecified: Secondary | ICD-10-CM

## 2020-10-19 DIAGNOSIS — G4700 Insomnia, unspecified: Secondary | ICD-10-CM | POA: Diagnosis not present

## 2020-10-19 DIAGNOSIS — F411 Generalized anxiety disorder: Secondary | ICD-10-CM

## 2020-10-19 MED ORDER — GABAPENTIN 400 MG PO CAPS: 400.0000 mg | ORAL_CAPSULE | Freq: Three times a day (TID) | ORAL | 2 refills | Status: DC

## 2020-10-19 MED ORDER — LAMOTRIGINE 100 MG PO TABS: 100.0000 mg | ORAL_TABLET | Freq: Every day | ORAL | 2 refills | Status: DC

## 2020-10-19 MED ORDER — BUPROPION HCL ER (XL) 150 MG PO TB24: 150.0000 mg | ORAL_TABLET | ORAL | 2 refills | Status: DC

## 2020-10-19 MED ORDER — TRAZODONE HCL 50 MG PO TABS: 50.0000 mg | ORAL_TABLET | Freq: Every day | ORAL | 2 refills | Status: DC

## 2020-10-19 MED ORDER — CARIPRAZINE HCL 1.5 MG PO CAPS: 1.5000 mg | ORAL_CAPSULE | Freq: Every day | ORAL | 2 refills | Status: DC

## 2020-10-19 NOTE — Progress Notes (Signed)
BH MD/PA/NP OP Progress Note  10/19/2020 1:21 PM George Brady  MRN:  [id number]  Chief Complaint: Follow-up and medication management  HPI:   George Brady is a 54 year-old, male with past psychiatric history significant for anxiety, bipolar I disorder, and insomnia who presents to [facility] for follow-up and medication management.  Patient is currently being managed on the following medications:  Gabapentin 400 mg 3 times daily Wellbutrin 150 mg 1 time daily Vraylar 1.5 mg 1 time daily Lamictal 100 mg 1 time daily Trazodone 50 mg 1 time daily  Patient reports that his current medication regimen is still going well and the he feels stable.  Patient denies the need for dosage adjustments at this time.  Patient is requesting refills on all his medications following conclusion of the encounter.  Patient reports that his job in [employer] is going well and states there is always work for him needed to be done.  Patient is a pleasant, calm, cooperative, and fully engaged in conversation during the encounter.  Patient denies suicidal homicidal ideations.  He further denies auditory and visual hallucinations.  He endorses good sleep and receives on average 6 to 7 hours of sleep each night.  Patient states that he wishes his appetite was better.  Patient reports that ever since he got COVID, he cannot smell or taste his food.  Patient denies alcohol consumption and illicit drug use.  Patient endorses tobacco use and smokes a pack per day.  Visit Diagnosis:    ICD-10-CM   1. Anxiety state  F41.1 buPROPion (WELLBUTRIN XL) 150 MG 24 hr tablet    gabapentin (NEURONTIN) 400 MG capsule  2. Bipolar I disorder (HCC)  F31.9 buPROPion (WELLBUTRIN XL) 150 MG 24 hr tablet    cariprazine (VRAYLAR) capsule    lamoTRIgine (LAMICTAL) 100 MG tablet    traZODone (DESYREL) 50 MG tablet  3. Insomnia, unspecified type  G47.00     Past Psychiatric History:   Anxiety State Bipolar Disorder I Insomnia  Past Medical History:  Past Medical History:  Diagnosis Date  . Hepatitis C   . Manic depressive disorder [facility])     Past Surgical History:  Procedure Laterality Date  . ABDOMINAL SURGERY      Family Psychiatric History:  Patient does not know if there are psychiatric illnesses on his mom's side  Family History: No family history on file.  Social History:  Social History   Socioeconomic History  . Marital status: Married    Spouse name: Not on file  . Number of children: Not on file  . Years of education: Not on file  . Highest education level: Not on file  Occupational History  . Not on file  Tobacco Use  . Smoking status: Current Every Day Smoker    Packs/day: 0.50  . Smokeless tobacco: Never Used  Substance and Sexual Activity  . Alcohol use: Yes    Comment: occasional  . Drug use: Never  . Sexual activity: Not on file  Other Topics Concern  . Not on file  Social History Narrative  . Not on file   Social Determinants of Health   Financial Resource Strain:   . Difficulty of Paying Living Expenses: Not on file  Food Insecurity:   . Worried About Programme researcher, broadcasting/film/video in the Last Year: Not on file  . Ran Out of Food in the Last Year: Not on file  Transportation Needs:   . Lack of  Transportation (Medical): Not on file  . Lack of Transportation (Non-Medical): Not on file  Physical Activity:   . Days of Exercise per Week: Not on file  . Minutes of Exercise per Session: Not on file  Stress:   . Feeling of Stress : Not on file  Social Connections:   . Frequency of Communication with Friends and Family: Not on file  . Frequency of Social Gatherings with Friends and Family: Not on file  . Attends Religious Services: Not on file  . Active Member of Clubs or Organizations: Not on file  . Attends Banker Meetings: Not on file  . Marital Status: Not on file    Allergies: No Known Allergies  Metabolic  Disorder Labs: No results found for: HGBA1C, MPG No results found for: PROLACTIN No results found for: CHOL, TRIG, HDL, CHOLHDL, VLDL, LDLCALC No results found for: TSH  Therapeutic Level Labs: No results found for: LITHIUM No results found for: VALPROATE No components found for:  CBMZ  Current Medications: Current Outpatient Medications  Medication Sig Dispense Refill  . buPROPion (WELLBUTRIN XL) 150 MG 24 hr tablet Take 1 tablet (150 mg total) by mouth every morning. 30 tablet 2  . cariprazine (VRAYLAR) capsule Take 1 capsule (1.5 mg total) by mouth daily. 30 capsule 2  . gabapentin (NEURONTIN) 400 MG capsule Take 1 capsule (400 mg total) by mouth 3 (three) times daily. 90 capsule 2  . lamoTRIgine (LAMICTAL) 100 MG tablet Take 1 tablet (100 mg total) by mouth daily. 30 tablet 2  . traZODone (DESYREL) 50 MG tablet Take 1 tablet (50 mg total) by mouth at bedtime. 30 tablet 2   No current facility-administered medications for this visit.     Musculoskeletal: Strength & Muscle Tone: within normal limits Gait & Station: normal Patient leans: N/A  Psychiatric Specialty Exam: Review of Systems  Psychiatric/Behavioral: Negative for decreased concentration, dysphoric mood, hallucinations, sleep disturbance and suicidal ideas. The patient is not nervous/anxious.     There were no vitals taken for this visit.There is no height or weight on file to calculate BMI.  General Appearance: Fairly Groomed and Neat  Eye Contact:  Good  Speech:  Clear and Coherent and Normal Rate  Volume:  Normal  Mood:  Euthymic  Affect:  Appropriate  Thought Process:  Coherent, Goal Directed and Descriptions of Associations: Intact  Orientation:  Full (Time, Place, and Person)  Thought Content: WDL   Suicidal Thoughts:  No  Homicidal Thoughts:  No  Memory:  Immediate;   Good Recent;   Good Remote;   Good  Judgement:  Good  Insight:  Good  Psychomotor Activity:  Normal  Concentration:  Concentration:  Good and Attention Span: Good  Recall:  Good  Fund of Knowledge: Good  Language: Good  Akathisia:  Negative  Handed:  Right  AIMS (if indicated): not done  Assets:  Communication Skills Desire for Improvement Housing Vocational/Educational  ADL's:  Intact  Cognition: WNL  Sleep:  Good   Screenings:   Assessment and Plan:  Inaki Vantine is a 54 year-old, male with past psychiatric history significant for anxiety, bipolar I disorder, and insomnia who presents to [facility] for follow-up and medication management. Patient reports that his current medication regimen is still going well and the he feels stable.  Patient denies the need for dosage adjustments at this time.  Patient is requesting refills on all his medications following conclusion of the encounter.  1.  Anxiety state  - buPROPion (WELLBUTRIN XL) 150 MG 24 hr tablet; Take 1 tablet (150 mg total) by mouth every morning.  Dispense: 30 tablet; Refill: 2 - gabapentin (NEURONTIN) 400 MG capsule; Take 1 capsule (400 mg total) by mouth 3 (three) times daily.  Dispense: 90 capsule; Refill: 2  2. Bipolar I disorder (HCC)  - buPROPion (WELLBUTRIN XL) 150 MG 24 hr tablet; Take 1 tablet (150 mg total) by mouth every morning.  Dispense: 30 tablet; Refill: 2 - cariprazine (VRAYLAR) capsule; Take 1 capsule (1.5 mg total) by mouth daily.  Dispense: 30 capsule; Refill: 2 - lamoTRIgine (LAMICTAL) 100 MG tablet; Take 1 tablet (100 mg total) by mouth daily.  Dispense: 30 tablet; Refill: 2   3. Insomnia, unspecified type  - traZODone (DESYREL) 50 MG tablet; Take 1 tablet (50 mg total) by mouth at bedtime.  Dispense: 30 tablet; Refill: 2  Patient to follow up in 2 months  Meta Hatchet, PA 10/19/2020, 1:21 PM

## 2020-11-06 MED FILL — VRAYLAR 1.5 MG CAPSULE: 1.5 | 30 days supply | Qty: 30 | Fill #1

## 2020-11-28 ENCOUNTER — Other Ambulatory Visit: Payer: Self-pay

## 2020-11-28 ENCOUNTER — Emergency Department (HOSPITAL_BASED_OUTPATIENT_CLINIC_OR_DEPARTMENT_OTHER): Payer: Self-pay

## 2020-11-28 ENCOUNTER — Encounter (HOSPITAL_BASED_OUTPATIENT_CLINIC_OR_DEPARTMENT_OTHER): Payer: Self-pay | Admitting: *Deleted

## 2020-11-28 ENCOUNTER — Emergency Department (HOSPITAL_BASED_OUTPATIENT_CLINIC_OR_DEPARTMENT_OTHER)
Admission: EM | Admit: 2020-11-28 | Discharge: 2020-11-28 | Disposition: A | Discharge status: Home / Self Care | Payer: Self-pay | Attending: Emergency Medicine | Admitting: Emergency Medicine

## 2020-11-28 DIAGNOSIS — F17200 Nicotine dependence, unspecified, uncomplicated: Secondary | ICD-10-CM | POA: Insufficient documentation

## 2020-11-28 DIAGNOSIS — S92352A Displaced fracture of fifth metatarsal bone, left foot, initial encounter for closed fracture: Secondary | ICD-10-CM | POA: Insufficient documentation

## 2020-11-28 DIAGNOSIS — X501XXA Overexertion from prolonged static or awkward postures, initial encounter: Secondary | ICD-10-CM | POA: Insufficient documentation

## 2020-11-28 NOTE — Discharge Instructions (Signed)
Please read instructions below. Wear the boot when ambulatory.  Use the crutches and avoid bearing any weight to your left foot. Elevate your foot as much as possible. Apply ice for 20 minutes at a time to help with swelling. You can continue taking over-the-counter medication as needed for pain. Call the orthopedic specialist office today or the next business day to schedule an appointment for repeat xray and follow up on your injury. Return to the ED for concerning symptoms.

## 2020-11-28 NOTE — ED Provider Notes (Signed)
[facility] EMERGENCY DEPARTMENT Provider Note   CSN: [id number] Arrival date & time: 11/28/20  1239     History Chief Complaint  Patient presents with  . Foot Injury    George Brady is a 55 y.o. male presenting to the emergency department with 5 days of left foot pain.  He states he rolled his foot causing his injury.  He did not have any other injuries other than to his foot.  He has developed swelling.  Pain is significantly worse with any weightbearing.  He has some tingling in his toes though no numbness or wounds.  On anticoagulation.  Has treated his symptoms with ibuprofen.  The history is provided by the patient.       Past Medical History:  Diagnosis Date  . Hepatitis C   . Manic depressive disorder [facility])     Patient Active Problem List   Diagnosis Date Noted  . Bipolar I disorder (HCC) 06/14/2020  . HEPATITIS C 07/01/2007  . Anxiety state 07/01/2007  . DEPRESSION 07/01/2007    Past Surgical History:  Procedure Laterality Date  . ABDOMINAL SURGERY         No family history on file.  Social History   Tobacco Use  . Smoking status: Current Every Day Smoker    Packs/day: 0.50  . Smokeless tobacco: Never Used  Substance Use Topics  . Alcohol use: Yes    Comment: occasional  . Drug use: Never    Home Medications Prior to Admission medications   Medication Sig Start Date End Date Taking? Authorizing Provider  buPROPion (WELLBUTRIN XL) 150 MG 24 hr tablet Take 1 tablet (150 mg total) by mouth every morning. 10/19/20 02/16/21 Yes Nwoko, Tommas Olp, PA  cariprazine (VRAYLAR) capsule Take 1 capsule (1.5 mg total) by mouth daily. 10/19/20 02/16/21 Yes Nwoko, Tommas Olp, PA  gabapentin (NEURONTIN) 400 MG capsule Take 1 capsule (400 mg total) by mouth 3 (three) times daily. 10/19/20 02/16/21 Yes Nwoko, Tommas Olp, PA  lamoTRIgine (LAMICTAL) 100 MG tablet Take 1 tablet (100 mg total) by mouth daily. 10/19/20 02/16/21 Yes Nwoko, Tommas Olp, PA  traZODone  (DESYREL) 50 MG tablet Take 1 tablet (50 mg total) by mouth at bedtime. 10/19/20 02/16/21 Yes Nwoko, Tommas Olp, PA    Allergies    Patient has no known allergies.  Review of Systems   Review of Systems  Musculoskeletal: Positive for arthralgias.  Skin: Positive for color change. Negative for wound.  Hematological: Does not bruise/bleed easily.    Physical Exam Updated Vital Signs BP 111/79 (BP Location: Left Arm)   Pulse 74   Temp 98.1 F (36.7 C) (Oral)   Resp 18   Ht 5\' 8"  (1.727 m)   Wt 67.1 kg   SpO2 97%   BMI 22.49 kg/m   Physical Exam Vitals and nursing note reviewed.  Constitutional:      Appearance: He is well-developed.  HENT:     Head: Normocephalic and atraumatic.  Eyes:     Conjunctiva/sclera: Conjunctivae normal.  Cardiovascular:     Rate and Rhythm: Normal rate.  Pulmonary:     Effort: Pulmonary effort is normal.  Musculoskeletal:     Comments: Left foot with bruising noted distally, some redness to lateral distal/dorsal aspect. Some swelling is noted as well. No gross deformity. No wounds. Normal distal sensation. Ankle is nontender and atraumatic with nl ROM. Intact DP pulse  Neurological:     Mental Status: He is alert.  Psychiatric:  Mood and Affect: Mood normal.        Behavior: Behavior normal.     ED Results / Procedures / Treatments   Labs (all labs ordered are listed, but only abnormal results are displayed) Labs Reviewed - No data to display  EKG None  Radiology DG Foot Complete Left  Result Date: 11/28/2020 CLINICAL DATA:  Pain following twisting injury EXAM: LEFT FOOT - COMPLETE 3+ VIEW COMPARISON:  None. FINDINGS: Frontal, oblique, and lateral views were obtained. There is an obliquely oriented mildly comminuted fracture involving the mid to distal aspects of the fifth metatarsal. There is medial displacement of the distal major fracture fragment with respect to the major proximal fragment. No other fractures are evident. No  dislocation. Joint spaces appear unremarkable. No erosive change. IMPRESSION: Fracture mid to distal aspect of the fifth metatarsal with displacement of major fracture fragments. No other fracture. No dislocation. Joint spaces appear unremarkable. Electronically Signed   By: Bretta Bang III M.D.   On: 11/28/2020 13:13    Procedures Procedures (including critical care time)  Medications Ordered in ED Medications - No data to display  ED Course  I have reviewed the triage vital signs and the nursing notes.  Pertinent labs & imaging results that were available during my care of the patient were reviewed by me and considered in my medical decision making (see chart for details).    MDM Rules/Calculators/A&P                          Patient with closed displaced acute fracture to the mid to distal aspect of left fifth metatarsal that occurred 5 days ago.  Neurovascular intact.  Pain is worse with weightbearing and is managed well at rest with ibuprofen.  Will place in cam walker boot for immobilization and crutches for nonweightbearing.  He is provided with orthopedic referral for follow-up.  Discussed possibility of need for surgical intervention.  Patient discharged in no distress.  Discussed results, findings, treatment and follow up. Patient advised of return precautions. Patient verbalized understanding and agreed with plan.  Final Clinical Impression(s) / ED Diagnoses Final diagnoses:  Displaced fracture of fifth metatarsal bone, left foot, initial encounter for closed fracture    Rx / DC Orders ED Discharge Orders    None       Whitlee Sluder, [place] N, PA-C 11/28/20 1432    Pollyann Savoy, MD 11/28/20 1432

## 2020-11-28 NOTE — ED Triage Notes (Signed)
Left foot injury. 5 days ago he tripped and twisted his foot.

## 2020-11-29 ENCOUNTER — Other Ambulatory Visit: Payer: Self-pay | Admitting: Surgical

## 2020-11-29 MED FILL — traMADol HCL 50 MG TABS: 50 | 5 days supply | Qty: 20 | Fill #0

## 2020-12-05 MED FILL — VRAYLAR 1.5 MG CAPSULE: 1.5 | 30 days supply | Qty: 30 | Fill #0

## 2020-12-15 ENCOUNTER — Encounter (HOSPITAL_COMMUNITY): Payer: Self-pay | Admitting: Physician Assistant

## 2020-12-20 ENCOUNTER — Telehealth (HOSPITAL_COMMUNITY): Payer: No Payment, Other | Admitting: Physician Assistant

## 2021-01-03 MED FILL — VRAYLAR 1.5 MG CAPSULE: 1.5 | 30 days supply | Qty: 30 | Fill #1

## 2021-01-25 DIAGNOSIS — I213 ST elevation (STEMI) myocardial infarction of unspecified site: Secondary | ICD-10-CM | POA: Diagnosis present

## 2021-01-25 DIAGNOSIS — I2102 ST elevation (STEMI) myocardial infarction involving left anterior descending coronary artery: Secondary | ICD-10-CM | POA: Diagnosis present

## 2021-01-25 HISTORY — DX: Hypothyroidism, unspecified: E03.9

## 2021-01-25 HISTORY — DX: Malignant neoplasm of thyroid gland: C73

## 2021-01-25 HISTORY — DX: Postprocedural hypothyroidism: E89.0

## 2021-01-26 IMAGING — CT CT RENAL STONE PROTOCOL
2 of 4 series · 16 of 46 positions shown, 18 images · non-contrast
Comparison: None.

CLINICAL DATA: Left flank pain with gross hematuria. Refractory UTI
symptoms.

EXAM:
CT ABDOMEN AND PELVIS WITHOUT CONTRAST
TECHNIQUE: Multidetector CT imaging of the abdomen and pelvis was performed
following the standard protocol without IV contrast.

[Series 2: axial st · axial · 0.71mm/px · z∈[-494,-104]mm · 13 of 86 slices shown, 15 images]
[im 4/86  soft-tissue]
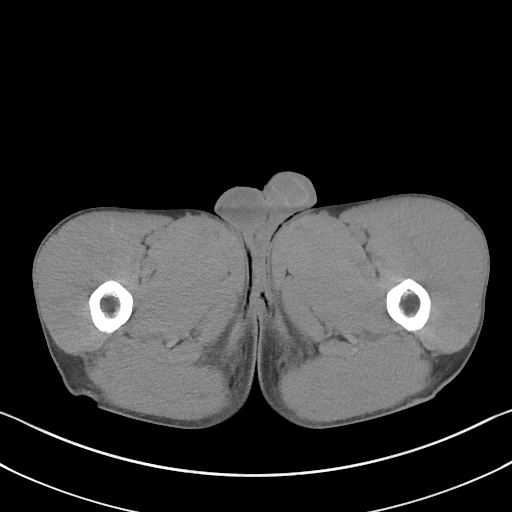
[im 4/86  bone]
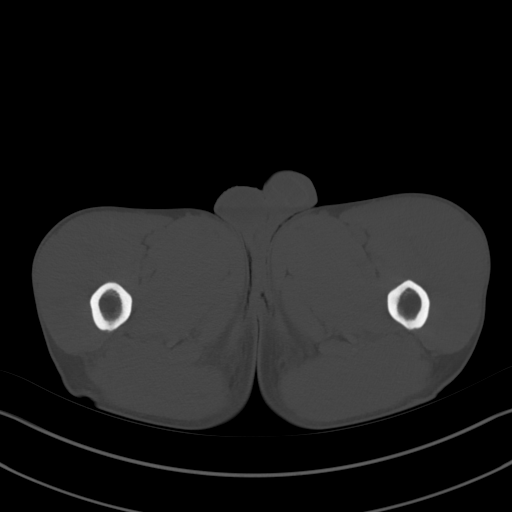
[im 11/86  soft-tissue]
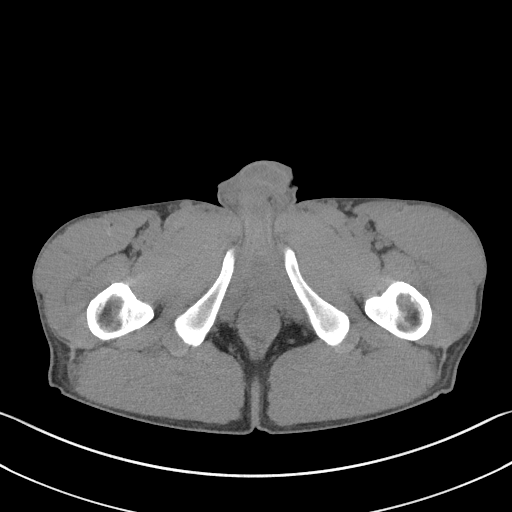
[im 18/86  soft-tissue]
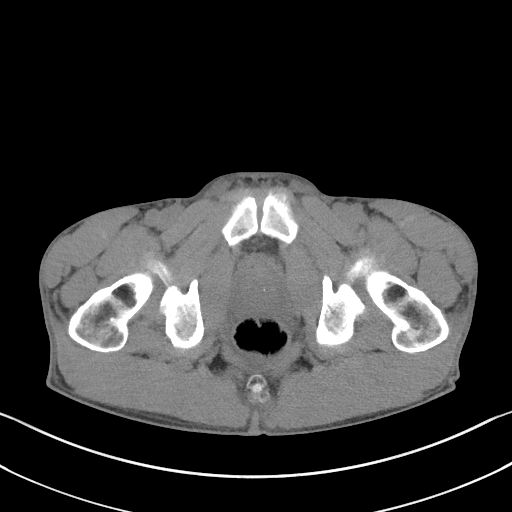
[im 24/86  soft-tissue]
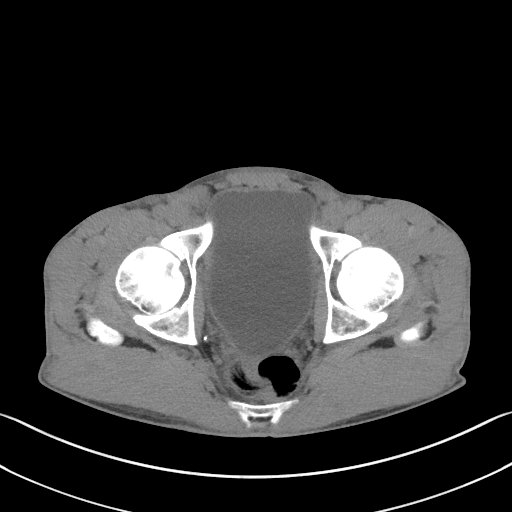
[im 31/86  soft-tissue]
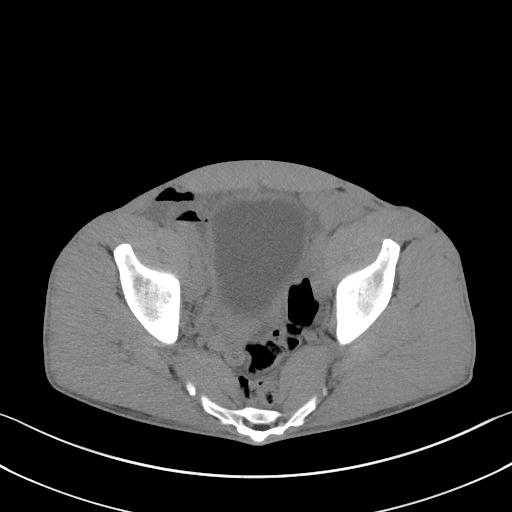
[im 38/86  soft-tissue]
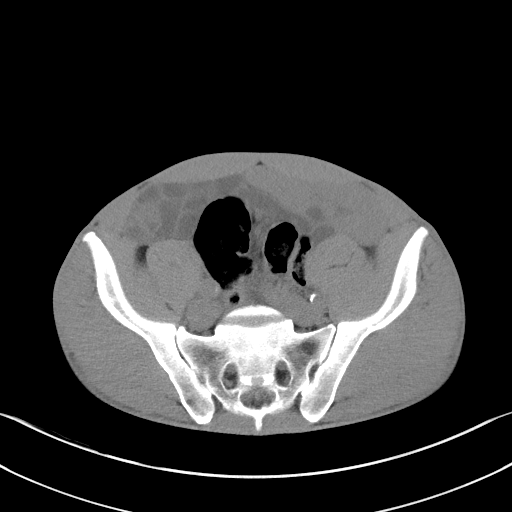
[im 45/86  soft-tissue]
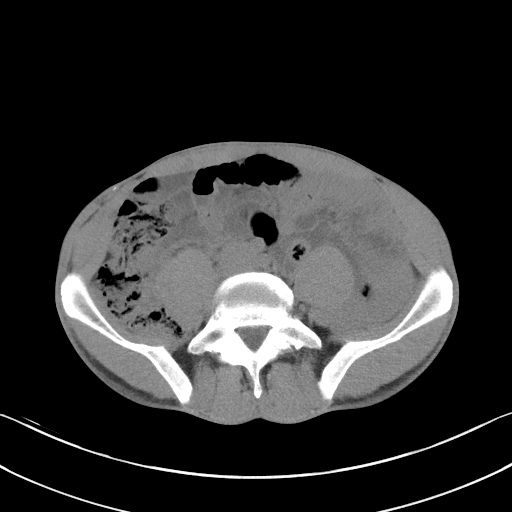
[im 48/86  soft-tissue]
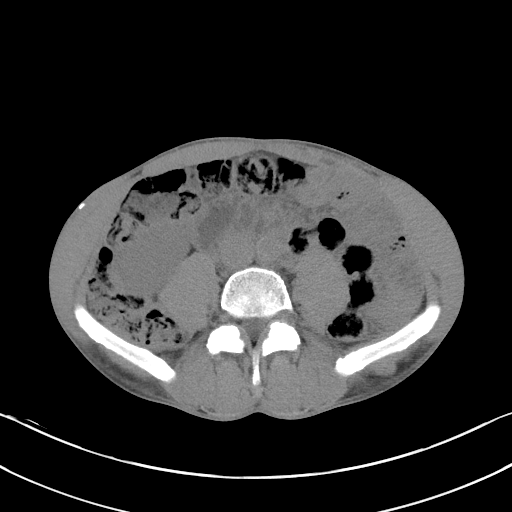
[im 55/86  soft-tissue]
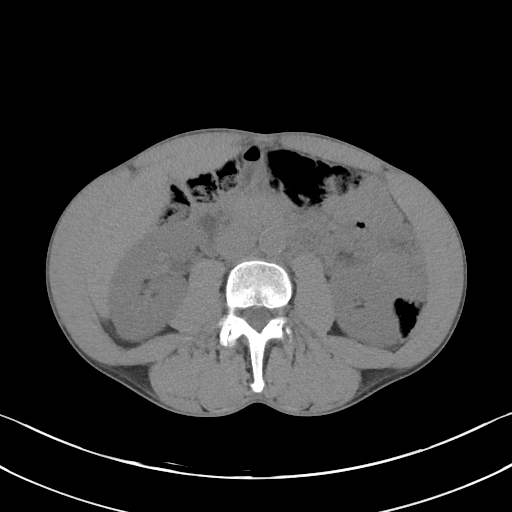
[im 55/86  bone]
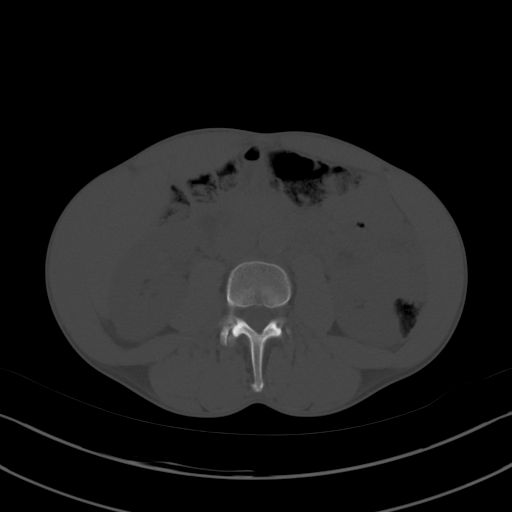
[im 62/86  soft-tissue]
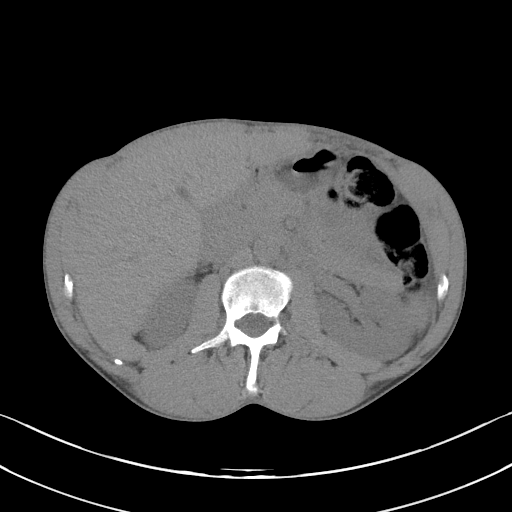
[im 69/86  soft-tissue]
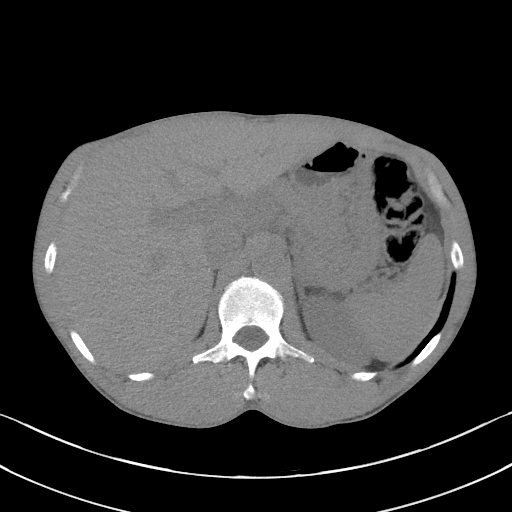
[im 75/86  soft-tissue]
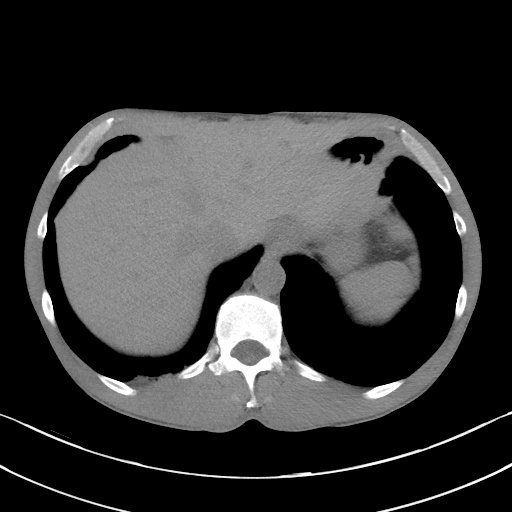
[im 82/86  soft-tissue]
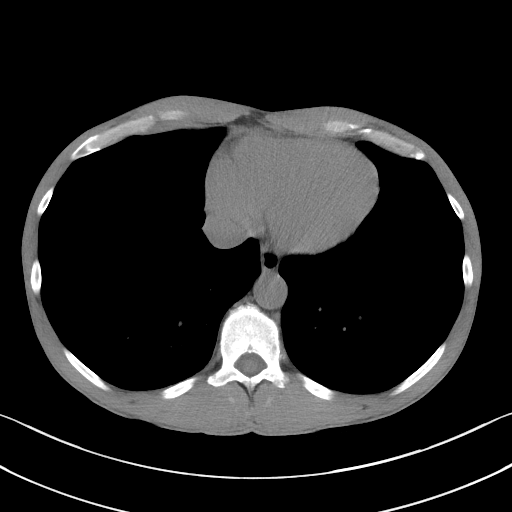

[Series 5: coronal st · coronal · 0.76mm/px · 3 of 78 slices shown]
[im 26/78  soft-tissue]
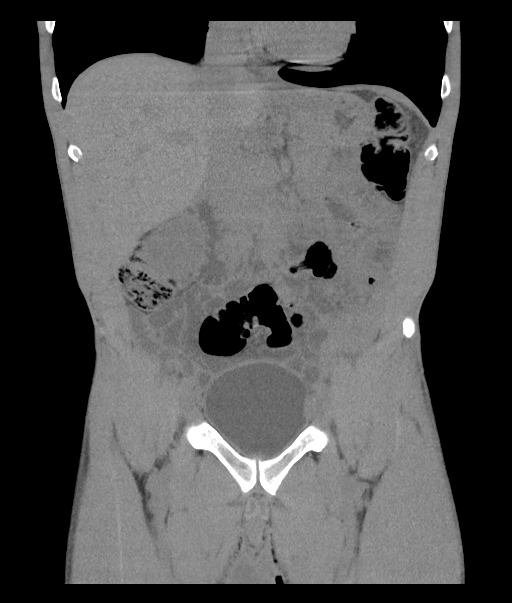
[im 35/78  soft-tissue]
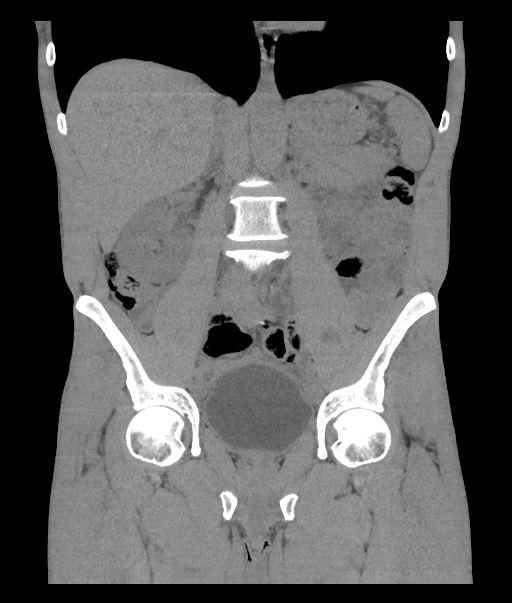
[im 43/78  soft-tissue]
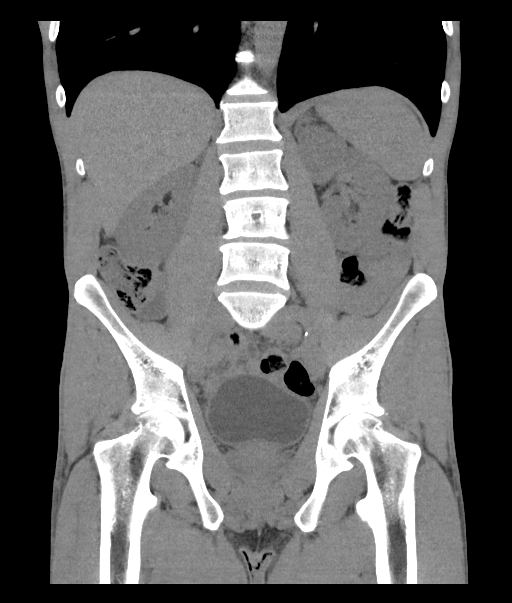

[16 of 46 positions shown; findings below may reference images not displayed]

FINDINGS: Lower chest: Lung bases are clear.

Hepatobiliary: Normal appearance of the liver. There appears to be a
small decompressed gallbladder. No significant biliary dilatation.

Pancreas: Limited evaluation without gross abnormality.

Spleen: Normal in size without focal abnormality.

Adrenals/Urinary Tract: Normal adrenal glands. Normal appearance of
both kidneys without stones or hydronephrosis. Moderate distention
of the urinary bladder.

Stomach/Bowel: The lack of oral contrast and minimal intra-abdominal
fat limits evaluation of the bowel structures. There is no evidence
to suggest bowel obstruction or inflammation. An appendix is not
visualized.

Vascular/Lymphatic: Atherosclerotic calcifications in the aorta and
iliac arteries without aneurysm. No abdominopelvic lymphadenopathy.

Reproductive: Prostate is unremarkable.

Other: No ascites.  Negative for free air.

Musculoskeletal: Old fracture of the right twelfth rib. Small foci
of lucency in the posterior right ilium is probably an incidental
finding.
IMPRESSION: 1. Negative for kidney stones or hydronephrosis.
2. No acute abnormality in the abdomen or pelvis.
3. Moderate distention of the urinary bladder.

## 2021-01-31 ENCOUNTER — Ambulatory Visit (INDEPENDENT_AMBULATORY_CARE_PROVIDER_SITE_OTHER): Payer: No Payment, Other | Admitting: Physician Assistant

## 2021-01-31 ENCOUNTER — Other Ambulatory Visit: Payer: Self-pay

## 2021-01-31 ENCOUNTER — Other Ambulatory Visit (HOSPITAL_COMMUNITY): Payer: Self-pay | Admitting: Physician Assistant

## 2021-01-31 DIAGNOSIS — F411 Generalized anxiety disorder: Secondary | ICD-10-CM | POA: Diagnosis not present

## 2021-01-31 DIAGNOSIS — F319 Bipolar disorder, unspecified: Secondary | ICD-10-CM

## 2021-01-31 MED ORDER — TRAZODONE HCL 50 MG PO TABS
50.0000 mg | ORAL_TABLET | Freq: Every day | ORAL | 2 refills | Status: DC
Start: 2021-01-31 — End: 2021-04-03

## 2021-01-31 MED ORDER — BUPROPION HCL ER (XL) 150 MG PO TB24: 150.0000 mg | ORAL_TABLET | ORAL | 2 refills | Status: DC

## 2021-01-31 MED ORDER — CARIPRAZINE HCL 1.5 MG PO CAPS: 1.5000 mg | ORAL_CAPSULE | Freq: Every day | ORAL | 2 refills | Status: DC

## 2021-01-31 MED ORDER — GABAPENTIN 400 MG PO CAPS: 400.0000 mg | ORAL_CAPSULE | Freq: Three times a day (TID) | ORAL | 2 refills | Status: DC

## 2021-01-31 MED ORDER — LAMOTRIGINE 100 MG PO TABS: 100.0000 mg | ORAL_TABLET | Freq: Every day | ORAL | 2 refills | Status: DC

## 2021-02-01 ENCOUNTER — Telehealth (HOSPITAL_COMMUNITY): Payer: Self-pay | Admitting: *Deleted

## 2021-02-01 ENCOUNTER — Encounter (HOSPITAL_COMMUNITY): Payer: Self-pay | Admitting: Physician Assistant

## 2021-02-01 NOTE — Progress Notes (Signed)
BH MD/PA/NP OP Progress Note  02/01/2021 2:11 AM George Brady  MRN:  [id number]  Chief Complaint:  Chief Complaint    med eval     HPI:   George Brady is a 55 year old male with a past psychiatric history significant for bipolar disorder and anxiety who presents to [facility] for follow-up and medication management.  Patient is currently taking the following medications:  Gabapentin 400 mg 3 times daily Wellbutrin XL 150 mg 24-hour tablet Vraylar 1.5 mg daily Lamictal 100 mg daily Trazodone 50 mg at bedtime  Patient reports no issues or concerns with his current medication regimen.  Patient denies the need for dosage adjustments at this time and is requesting refills on all his medications.  Patient's main stressor involves dealing with his future mother-in-law who appears to have dementia.  Patient reports that he has experienced a lot of pressure with taking care of his mother-in-law.  Patient states that the most stressful aspect of taking care of his mother-in-law is getting her to calm down during sun down.  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  Patient reports that his overall anxiety has been good.  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  Patient is pleasant, calm, cooperative, and fully engaged in conversation during the encounter.  Patient reports that his mood is great.  Patient denies suicidal or homicidal ideations.  He further denies auditory or visual hallucinations and does not appear to be acting on internal/external stimuli.  Patient endorses good sleep and receives on average 8 to 9 hours of sleep each night.  Patient endorses poor appetite and states that he is still having trouble smelling and tasting his food.  Patient reports that most of  the food he eats has a strange taste to it and he often only eats smaller portions of his food.  Patient endorses alcohol consumption and states that he has two 16 ounces each day.  Patient endorses tobacco use and smokes roughly a pack per day.  Patient denies illicit drug use.  Visit Diagnosis:    ICD-10-CM   1. Bipolar I disorder (HCC)  F31.9 cariprazine (VRAYLAR) capsule    lamoTRIgine (LAMICTAL) 100 MG tablet    buPROPion (WELLBUTRIN XL) 150 MG 24 hr tablet    traZODone (DESYREL) 50 MG tablet  2. Anxiety state  F41.1 buPROPion (WELLBUTRIN XL) 150 MG 24 hr tablet    gabapentin (NEURONTIN) 400 MG capsule    Past Psychiatric History: Anxiety State Bipolar Disorder I Insomnia  Past Medical History:  Past Medical History:  Diagnosis Date  . Hepatitis C   . Manic depressive disorder [facility])     Past Surgical History:  Procedure Laterality Date  . ABDOMINAL SURGERY      Family Psychiatric History:  Patient does not know if there are psychiatric illnesses on his mom's side  Family History: History reviewed. No pertinent family history.  Social History:  Social History   Socioeconomic History  . Marital status: Married    Spouse name: Not on file  . Number of children: Not on file  . Years of education: Not on file  . Highest education level: Not on file  Occupational History  . Not on file  Tobacco Use  . Smoking status: Current  Every Day Smoker    Packs/day: 0.50  . Smokeless tobacco: Never Used  Substance and Sexual Activity  . Alcohol use: Yes    Comment: occasional  . Drug use: Never  . Sexual activity: Not on file  Other Topics Concern  . Not on file  Social History Narrative  . Not on file   Social Determinants of Health   Financial Resource Strain: Not on file  Food Insecurity: Not on file  Transportation Needs: Not on file  Physical Activity: Not on file  Stress: Not on file  Social Connections: Not on file    Allergies: No Known  Allergies  Metabolic Disorder Labs: No results found for: HGBA1C, MPG No results found for: PROLACTIN No results found for: CHOL, TRIG, HDL, CHOLHDL, VLDL, LDLCALC No results found for: TSH  Therapeutic Level Labs: No results found for: LITHIUM No results found for: VALPROATE No components found for:  CBMZ  Current Medications: Current Outpatient Medications  Medication Sig Dispense Refill  . buPROPion (WELLBUTRIN XL) 150 MG 24 hr tablet Take 1 tablet (150 mg total) by mouth every morning. 30 tablet 2  . cariprazine (VRAYLAR) capsule Take 1 capsule (1.5 mg total) by mouth daily. 30 capsule 2  . gabapentin (NEURONTIN) 400 MG capsule Take 1 capsule (400 mg total) by mouth 3 (three) times daily. 90 capsule 2  . lamoTRIgine (LAMICTAL) 100 MG tablet Take 1 tablet (100 mg total) by mouth daily. 30 tablet 2  . traZODone (DESYREL) 50 MG tablet Take 1 tablet (50 mg total) by mouth at bedtime. 30 tablet 2   No current facility-administered medications for this visit.     Musculoskeletal: Strength & Muscle Tone: within normal limits Gait & Station: normal Patient leans: N/A  Psychiatric Specialty Exam: Review of Systems  Psychiatric/Behavioral: Negative for agitation, decreased concentration, dysphoric mood, hallucinations, self-injury, sleep disturbance and suicidal ideas. The patient is not nervous/anxious and is not hyperactive.     Blood pressure 115/67, pulse 80, height 5\' 8"  (1.727 m), weight 148 lb (67.1 kg), SpO2 96 %.Body mass index is 22.5 kg/m.  General Appearance: Fairly Groomed  Eye Contact:  Good  Speech:  Clear and Coherent and Normal Rate  Volume:  Normal  Mood:  Euthymic  Affect:  Appropriate  Thought Process:  Coherent, Goal Directed and Descriptions of Associations: Intact  Orientation:  Full (Time, Place, and Person)  Thought Content: WDL and Logical   Suicidal Thoughts:  No  Homicidal Thoughts:  No  Memory:  Immediate;   Good Recent;   Good Remote;   Good   Judgement:  Good  Insight:  Good  Psychomotor Activity:  Normal  Concentration:  Concentration: Good and Attention Span: Good  Recall:  Good  Fund of Knowledge: Good  Language: Good  Akathisia:  Negative  Handed:  Right  AIMS (if indicated): not done  Assets:  Communication Skills Desire for Improvement Housing Vocational/Educational  ADL's:  Intact  Cognition: WNL  Sleep:  Good   Screenings: GAD-7   Flowsheet Row Clinical Support from 01/31/2021 in [facility]  Total GAD-7 Score 3    PHQ2-9   Flowsheet Row Clinical Support from 01/31/2021 in [facility]  PHQ-2 Total Score 0    Flowsheet Row Clinical Support from 01/31/2021 in [facility]  C-SSRS RISK CATEGORY Low Risk       Assessment and Plan:   George Brady is a 55 year old male with a past  psychiatric history significant for bipolar disorder and anxiety who presents to [facility] for follow-up and medication management. Patient reports no issues or concerns with his current medication regimen.  Patient denies the need for dosage adjustments at this time and is requesting refills on all his medications.  Patient denies significant stressors except for taking care of his mother-in-law who may have dementia.  Patient's medications will be e-prescribed to pharmacy of choice.  1. Bipolar I disorder (HCC)  - cariprazine (VRAYLAR) capsule; Take 1 capsule (1.5 mg total) by mouth daily.  Dispense: 30 capsule; Refill: 2 - lamoTRIgine (LAMICTAL) 100 MG tablet; Take 1 tablet (100 mg total) by mouth daily.  Dispense: 30 tablet; Refill: 2 - buPROPion (WELLBUTRIN XL) 150 MG 24 hr tablet; Take 1 tablet (150 mg total) by mouth every morning.  Dispense: 30 tablet; Refill: 2 - traZODone (DESYREL) 50 MG tablet; Take 1 tablet (50 mg total) by mouth at bedtime.  Dispense: 30 tablet; Refill: 2  2. Anxiety  state  - buPROPion (WELLBUTRIN XL) 150 MG 24 hr tablet; Take 1 tablet (150 mg total) by mouth every morning.  Dispense: 30 tablet; Refill: 2 - gabapentin (NEURONTIN) 400 MG capsule; Take 1 capsule (400 mg total) by mouth 3 (three) times daily.  Dispense: 90 capsule; Refill: 2  Patient to follow up in 2 months  Meta Hatchet, PA 02/01/2021, 2:11 AM

## 2021-02-01 NOTE — Telephone Encounter (Signed)
Patient assistance for Vraylar obtained.  Dates are:  02/01/21 through 01/30/2022.

## 2021-02-17 ENCOUNTER — Other Ambulatory Visit: Payer: Self-pay

## 2021-04-03 ENCOUNTER — Ambulatory Visit (INDEPENDENT_AMBULATORY_CARE_PROVIDER_SITE_OTHER): Payer: No Payment, Other | Admitting: Physician Assistant

## 2021-04-03 ENCOUNTER — Other Ambulatory Visit: Payer: Self-pay

## 2021-04-03 ENCOUNTER — Encounter (HOSPITAL_COMMUNITY): Payer: Self-pay | Admitting: Physician Assistant

## 2021-04-03 DIAGNOSIS — F411 Generalized anxiety disorder: Secondary | ICD-10-CM

## 2021-04-03 DIAGNOSIS — F319 Bipolar disorder, unspecified: Secondary | ICD-10-CM

## 2021-04-03 MED ORDER — BUPROPION HCL ER (XL) 150 MG PO TB24: 150.0000 mg | ORAL_TABLET | ORAL | 2 refills | Status: DC

## 2021-04-03 MED ORDER — LAMOTRIGINE 100 MG PO TABS: 100.0000 mg | ORAL_TABLET | Freq: Every day | ORAL | 2 refills | Status: DC

## 2021-04-03 MED ORDER — TRAZODONE HCL 50 MG PO TABS
50.0000 mg | ORAL_TABLET | Freq: Every day | ORAL | 2 refills | Status: DC
Start: 2021-04-03 — End: 2021-07-05

## 2021-04-03 MED ORDER — CARIPRAZINE HCL 1.5 MG PO CAPS: ORAL_CAPSULE | ORAL | 2 refills | Status: DC

## 2021-04-03 MED ORDER — GABAPENTIN 400 MG PO CAPS
400.0000 mg | ORAL_CAPSULE | Freq: Three times a day (TID) | ORAL | 2 refills | Status: DC
Start: 2021-04-03 — End: 2021-07-05

## 2021-04-03 NOTE — Progress Notes (Signed)
BH MD/PA/NP OP Progress Note  04/03/2021 4:14 PM ANANT AGARD  MRN:  [id number]  Chief Complaint:  Chief Complaint    Medication Management     HPI:   George Brady is a 55 year old male with a past psychiatric history significant for bipolar disorder, anxiety, and insomnia who presents to [facility] behavioral health outpatient clinic for follow-up and medication management.  Patient is currently taking the following medications:  Gabapentin 400 mg 3 times daily Wellbutrin XL 150 mg 24-hour tablet Vraylar 1.5 mg daily Lamictal 100 mg daily Trazodone 50 mg at bedtime  Patient reports no issues or concerns regarding his current medication regimen.  Patient denies the need for dosage adjustments at this time and is requesting refills on all his medications.  Patient denies any issues or concerns regarding his mental health.  Patient does report that he is under a lot of stress due to experiencing multiple deaths within the family.  Patient reports that his girlfriend's mother and stepfather both recently passed and now she has inherited the family home as well as over $40,000 of debt tied to the house.  In addition to the debt associated with the house, patient states that some areas of the house are in a state of disrepair.  Although patient and his girlfriend both have jobs, he states that it is still going to be a difficult process paying off the house.  Despite the patient's concerns, patient states that he is doing relatively well and that his medications are working perfectly still.   Patient is pleasant, calm, cooperative, and fully engaged in conversation during the encounter.  Patient states that he feels good about himself and is in a good place mentally.  Patient denies suicidal or homicidal ideations.  He further denies auditory or visual hallucinations and does not appear to be responding to internal/external stimuli.  Patient endorses good sleep and receives on  average 6 hours of sleep each night.  Patient still endorses decreased appetite due to lack of taste and smell which is attributed to lasting effects from his past COVID infection.  Patient endorses alcohol consumption sparingly.  Patient endorses tobacco use and smokes roughly 3/4 a pack a day.  Patient denies illicit drug use.  Visit Diagnosis:    ICD-10-CM   1. Bipolar I disorder (HCC)  F31.9 cariprazine (VRAYLAR) 1.5 MG capsule    lamoTRIgine (LAMICTAL) 100 MG tablet    buPROPion (WELLBUTRIN XL) 150 MG 24 hr tablet    traZODone (DESYREL) 50 MG tablet  2. Anxiety state  F41.1 buPROPion (WELLBUTRIN XL) 150 MG 24 hr tablet    gabapentin (NEURONTIN) 400 MG capsule    Past Psychiatric History:  Anxiety State Bipolar Disorder I Insomnia  Past Medical History:  Past Medical History:  Diagnosis Date  . Hepatitis C   . Manic depressive disorder [facility])     Past Surgical History:  Procedure Laterality Date  . ABDOMINAL SURGERY      Family Psychiatric History:  Patient does not know if there are psychiatric illnesses on his mom's side  Family History: History reviewed. No pertinent family history.  Social History:  Social History   Socioeconomic History  . Marital status: Married    Spouse name: Not on file  . Number of children: Not on file  . Years of education: Not on file  . Highest education level: Not on file  Occupational History  . Not on file  Tobacco Use  . Smoking status: Current Every  Day Smoker    Packs/day: 0.50  . Smokeless tobacco: Never Used  Substance and Sexual Activity  . Alcohol use: Yes    Comment: occasional  . Drug use: Never  . Sexual activity: Not on file  Other Topics Concern  . Not on file  Social History Narrative  . Not on file   Social Determinants of Health   Financial Resource Strain: Not on file  Food Insecurity: Not on file  Transportation Needs: Not on file  Physical Activity: Not on file  Stress: Not on file  Social  Connections: Not on file    Allergies: Not on File  Metabolic Disorder Labs: No results found for: HGBA1C, MPG No results found for: PROLACTIN No results found for: CHOL, TRIG, HDL, CHOLHDL, VLDL, LDLCALC No results found for: TSH  Therapeutic Level Labs: No results found for: LITHIUM No results found for: VALPROATE No components found for:  CBMZ  Current Medications: Current Outpatient Medications  Medication Sig Dispense Refill  . buPROPion (WELLBUTRIN XL) 150 MG 24 hr tablet Take 1 tablet (150 mg total) by mouth every morning. 30 tablet 2  . cariprazine (VRAYLAR) 1.5 MG capsule TAKE 1 CAPSULE (1.5 MG TOTAL) BY MOUTH DAILY. 30 capsule 2  . gabapentin (NEURONTIN) 400 MG capsule Take 1 capsule (400 mg total) by mouth 3 (three) times daily. 90 capsule 2  . lamoTRIgine (LAMICTAL) 100 MG tablet Take 1 tablet (100 mg total) by mouth daily. 30 tablet 2  . traMADol (ULTRAM) 50 MG tablet TAKE 1 TABLET BY MOUTH EVERY SIX TO EIGHT HOURS AS NEEDED FOR PAIN 20 tablet 0  . traZODone (DESYREL) 50 MG tablet Take 1 tablet (50 mg total) by mouth at bedtime. 30 tablet 2   No current facility-administered medications for this visit.     Musculoskeletal: Strength & Muscle Tone: within normal limits Gait & Station: normal Patient leans: N/A  Psychiatric Specialty Exam: Review of Systems  Psychiatric/Behavioral: Negative for agitation, decreased concentration, dysphoric mood, hallucinations, self-injury, sleep disturbance and suicidal ideas. The patient is nervous/anxious. The patient is not hyperactive.     Blood pressure 115/85, pulse 96, height 5\' 8"  (1.727 m), weight 143 lb (64.9 kg), SpO2 96 %.Body mass index is 21.74 kg/m.  General Appearance: Well Groomed  Eye Contact:  Good  Speech:  Clear and Coherent and Normal Rate  Volume:  Normal  Mood:  Anxious and Euthymic  Affect:  Appropriate and Congruent  Thought Process:  Coherent and Descriptions of Associations: Intact  Orientation:   Full (Time, Place, and Person)  Thought Content: WDL   Suicidal Thoughts:  No  Homicidal Thoughts:  No  Memory:  Immediate;   Good Recent;   Good Remote;   Good  Judgement:  Good  Insight:  Good  Psychomotor Activity:  Normal  Concentration:  Concentration: Good and Attention Span: Good  Recall:  Good  Fund of Knowledge: Good  Language: Good  Akathisia:  Negative  Handed:  Right  AIMS (if indicated): not done  Assets:  Communication Skills Desire for Improvement Housing Vocational/Educational  ADL's:  Intact  Cognition: WNL  Sleep:  Good   Screenings: GAD-7   Flowsheet Row Office Visit from 04/03/2021 in [facility] Office Visit from 01/31/2021 in [facility]  Total GAD-7 Score 2 3    PHQ2-9   Flowsheet Row Office Visit from 04/03/2021 in [facility] Office Visit from 01/31/2021 in [facility]  PHQ-2 Total Score 1 0    Flowsheet Row Office Visit from 04/03/2021 in [facility] Office Visit from 01/31/2021 in [facility]  C-SSRS RISK CATEGORY Low Risk Low Risk      Assessment and Plan:   Arpan Eskelson is a 55 year old male with a past psychiatric history significant for bipolar disorder, anxiety, and insomnia who presents to [facility] for follow-up and medication management.  Patient reports being under some stress due to recent events surrounding the loss of family members and the recent inheritance of his girlfriend's family home.  Although patient feels that he is in unfamiliar territory when dealing with these new issues, he reports that his mental health remains untouched.  Patient reports no issues or concerns regarding his current medication regimen.  Patient denies a need for dosage adjustments at this time and is requesting refills on his medications.   Patient's medications to be e-prescribed to pharmacy of choice.  1. Bipolar I disorder (HCC)  - cariprazine (VRAYLAR) 1.5 MG capsule; TAKE 1 CAPSULE (1.5 MG TOTAL) BY MOUTH DAILY.  Dispense: 30 capsule; Refill: 2 - lamoTRIgine (LAMICTAL) 100 MG tablet; Take 1 tablet (100 mg total) by mouth daily.  Dispense: 30 tablet; Refill: 2 - buPROPion (WELLBUTRIN XL) 150 MG 24 hr tablet; Take 1 tablet (150 mg total) by mouth every morning.  Dispense: 30 tablet; Refill: 2 - traZODone (DESYREL) 50 MG tablet; Take 1 tablet (50 mg total) by mouth at bedtime.  Dispense: 30 tablet; Refill: 2  2. Anxiety state  - buPROPion (WELLBUTRIN XL) 150 MG 24 hr tablet; Take 1 tablet (150 mg total) by mouth every morning.  Dispense: 30 tablet; Refill: 2 - gabapentin (NEURONTIN) 400 MG capsule; Take 1 capsule (400 mg total) by mouth 3 (three) times daily.  Dispense: 90 capsule; Refill: 2  Patient to follow up in 2 months  Meta Hatchet, PA 04/03/2021, 4:14 PM

## 2021-04-27 ENCOUNTER — Other Ambulatory Visit (HOSPITAL_COMMUNITY): Payer: Self-pay | Admitting: Physician Assistant

## 2021-04-27 ENCOUNTER — Other Ambulatory Visit: Payer: Self-pay

## 2021-04-27 DIAGNOSIS — F319 Bipolar disorder, unspecified: Secondary | ICD-10-CM

## 2021-04-27 MED ORDER — CARIPRAZINE HCL 1.5 MG PO CAPS: ORAL_CAPSULE | ORAL | 2 refills | Status: DC

## 2021-04-27 NOTE — Progress Notes (Signed)
Copy of patient's prescription being printed off to be faxed out

## 2021-05-09 ENCOUNTER — Other Ambulatory Visit: Payer: Self-pay

## 2021-05-11 ENCOUNTER — Other Ambulatory Visit: Payer: Self-pay

## 2021-05-16 ENCOUNTER — Other Ambulatory Visit: Payer: Self-pay

## 2021-07-03 ENCOUNTER — Telehealth (HOSPITAL_COMMUNITY): Payer: Self-pay | Admitting: Physician Assistant

## 2021-07-03 ENCOUNTER — Telehealth (HOSPITAL_COMMUNITY): Payer: No Payment, Other | Admitting: Physician Assistant

## 2021-07-03 ENCOUNTER — Encounter (HOSPITAL_COMMUNITY): Payer: Self-pay | Admitting: Physician Assistant

## 2021-07-03 ENCOUNTER — Telehealth (INDEPENDENT_AMBULATORY_CARE_PROVIDER_SITE_OTHER): Payer: No Payment, Other | Admitting: Physician Assistant

## 2021-07-03 DIAGNOSIS — F319 Bipolar disorder, unspecified: Secondary | ICD-10-CM | POA: Diagnosis not present

## 2021-07-03 DIAGNOSIS — F411 Generalized anxiety disorder: Secondary | ICD-10-CM

## 2021-07-03 NOTE — Telephone Encounter (Deleted)
Patient called for REFILL:   Pharmacy :   Patient phone number: [phone] Last seen:   COMMENTS: R/S today's appt to 10/

## 2021-07-03 NOTE — Progress Notes (Signed)
BH MD/PA/NP OP Progress Note  Virtual Visit via Telephone Note  I connected with George Brady on 07/03/21 at  3:30 PM EDT by telephone and verified that I am speaking with the correct person using two identifiers.  Location: Patient: Work Provider: Clinic   I discussed the limitations, risks, security and privacy concerns of performing an evaluation and management service by telephone and the availability of in person appointments. I also discussed with the patient that there may be a patient responsible charge related to this service. The patient expressed understanding and agreed to proceed.  Follow Up Instructions:   I discussed the assessment and treatment plan with the patient. The patient was provided an opportunity to ask questions and all were answered. The patient agreed with the plan and demonstrated an understanding of the instructions.   The patient was advised to call back or seek an in-person evaluation if the symptoms worsen or if the condition fails to improve as anticipated.  I provided 18 minutes of non-face-to-face time during this encounter.  Meta Hatchet, PA    07/03/2021 11:51 PM George Brady  MRN:  [id number]  Chief Complaint: Follow up and medication management  HPI:     Visit Diagnosis:    ICD-10-CM   1. Bipolar I disorder (HCC)  F31.9     2. Anxiety state  F41.1       Past Psychiatric History:  Anxiety State Bipolar Disorder I Insomnia  Past Medical History:  Past Medical History:  Diagnosis Date   Hepatitis C    Manic depressive disorder (HCC)     Past Surgical History:  Procedure Laterality Date   ABDOMINAL SURGERY      Family Psychiatric History:  Patient does not know if there are psychiatric illnesses on his mom's side  Family History: History reviewed. No pertinent family history.  Social History:  Social History   Socioeconomic History   Marital status: Married    Spouse name: Not on file   Number of  children: Not on file   Years of education: Not on file   Highest education level: Not on file  Occupational History   Not on file  Tobacco Use   Smoking status: Every Day    Packs/day: 0.50    Types: Cigarettes   Smokeless tobacco: Never  Substance and Sexual Activity   Alcohol use: Yes    Comment: occasional   Drug use: Never   Sexual activity: Not on file  Other Topics Concern   Not on file  Social History Narrative   Not on file   Social Determinants of Health   Financial Resource Strain: Not on file  Food Insecurity: Not on file  Transportation Needs: Not on file  Physical Activity: Not on file  Stress: Not on file  Social Connections: Not on file    Allergies: Not on File  Metabolic Disorder Labs: No results found for: HGBA1C, MPG No results found for: PROLACTIN No results found for: CHOL, TRIG, HDL, CHOLHDL, VLDL, LDLCALC No results found for: TSH  Therapeutic Level Labs: No results found for: LITHIUM No results found for: VALPROATE No components found for:  CBMZ  Current Medications: Current Outpatient Medications  Medication Sig Dispense Refill   buPROPion (WELLBUTRIN XL) 150 MG 24 hr tablet Take 1 tablet (150 mg total) by mouth every morning. 30 tablet 2   cariprazine (VRAYLAR) 1.5 MG capsule TAKE 1 CAPSULE (1.5 MG TOTAL) BY MOUTH DAILY. 30 capsule 2   gabapentin (  NEURONTIN) 400 MG capsule Take 1 capsule (400 mg total) by mouth 3 (three) times daily. 90 capsule 2   lamoTRIgine (LAMICTAL) 100 MG tablet Take 1 tablet (100 mg total) by mouth daily. 30 tablet 2   traZODone (DESYREL) 50 MG tablet Take 1 tablet (50 mg total) by mouth at bedtime. 30 tablet 2   No current facility-administered medications for this visit.     Musculoskeletal: Strength & Muscle Tone: Unable to assess due to telemedicine visit Gait & Station: Unable to assess due to telemedicine visit Patient leans: Unable to assess due to telemedicine visit  Psychiatric Specialty  Exam: Review of Systems  Psychiatric/Behavioral:  Negative for decreased concentration, dysphoric mood, hallucinations, self-injury, sleep disturbance and suicidal ideas. The patient is not nervous/anxious and is not hyperactive.    There were no vitals taken for this visit.There is no height or weight on file to calculate BMI.  General Appearance: Unable to assess due to telemedicine visit  Eye Contact:  Unable to assess due to telemedicine visit  Speech:  Clear and Coherent and Normal Rate  Volume:  Normal  Mood:  Euthymic  Affect:  Appropriate  Thought Process:  Coherent and Descriptions of Associations: Intact  Orientation:  Full (Time, Place, and Person)  Thought Content: WDL   Suicidal Thoughts:  No  Homicidal Thoughts:  No  Memory:  Immediate;   Good Recent;   Good Remote;   Good  Judgement:  Good  Insight:  Good  Psychomotor Activity:  Normal  Concentration:  Concentration: Good and Attention Span: Good  Recall:  Good  Fund of Knowledge: Good  Language: Good  Akathisia:  NA  Handed:  Right  AIMS (if indicated): not done  Assets:  Communication Skills Desire for Improvement Housing Social Support Vocational/Educational  ADL's:  Intact  Cognition: WNL  Sleep:  Good   Screenings: GAD-7    Flowsheet Row Office Visit from 04/03/2021 in [facility] Office Visit from 01/31/2021 in [facility]  Total GAD-7 Score 2 3      PHQ2-9    Flowsheet Row Video Visit from 07/03/2021 in [facility] Office Visit from 04/03/2021 in [facility] Office Visit from 01/31/2021 in [facility]  PHQ-2 Total Score 0 1 0      Flowsheet Row Video Visit from 07/03/2021 in [facility] Office Visit from 04/03/2021 in [facility] Office Visit from 01/31/2021 in [facility]   C-SSRS RISK CATEGORY Low Risk Low Risk Low Risk        Assessment and Plan:     1. Bipolar I disorder (HCC) Patient is currently taking cariprazine 1.5 mg daily for the management of his bipolar disorder Patient is taking lamotrigine 100 mg daily for the management of his bipolar disorder Patient is currently taking bupropion (Wellbutrin XL) 150 mg 24-hour tablet daily for the management of his bipolar 1 disorder Patient is currently taking trazodone 50 mg at bedtime for the management of his bipolar 1 disorder  2. Anxiety state Patient to continue taking bupropion (Wellbutrin) 150 mg 24-hour tablet daily for the management of his anxiety Patient to continue taking gabapentin 400 mg 3 times daily management of his anxiety   Patient to follow up in 3 months Provider spent a total of 18 minutes with the patient/reviewing patient's chart  Meta Hatchet, PA 07/03/2021, 11:51 PM

## 2021-07-03 NOTE — Telephone Encounter (Signed)
Patient called for REFILL:  ALL Medications Pharmacy :  [facility] Patient phone number: [phone] Last seen:  04/03/21 COMMENTS: R/S today's appt to 08/21/21

## 2021-07-05 ENCOUNTER — Other Ambulatory Visit (HOSPITAL_COMMUNITY): Payer: Self-pay | Admitting: Physician Assistant

## 2021-07-05 DIAGNOSIS — F319 Bipolar disorder, unspecified: Secondary | ICD-10-CM

## 2021-07-05 DIAGNOSIS — F411 Generalized anxiety disorder: Secondary | ICD-10-CM

## 2021-07-05 MED ORDER — LAMOTRIGINE 100 MG PO TABS: 100.0000 mg | ORAL_TABLET | Freq: Every day | ORAL | 2 refills | Status: DC

## 2021-07-05 MED ORDER — GABAPENTIN 400 MG PO CAPS: 400.0000 mg | ORAL_CAPSULE | Freq: Three times a day (TID) | ORAL | 2 refills | Status: DC

## 2021-07-05 MED ORDER — BUPROPION HCL ER (XL) 150 MG PO TB24
150.0000 mg | ORAL_TABLET | ORAL | 2 refills | Status: DC
Start: 2021-07-05 — End: 2021-10-02

## 2021-07-05 MED ORDER — CARIPRAZINE HCL 1.5 MG PO CAPS: ORAL_CAPSULE | ORAL | 2 refills | Status: DC

## 2021-07-05 MED ORDER — TRAZODONE HCL 50 MG PO TABS: 50.0000 mg | ORAL_TABLET | Freq: Every day | ORAL | 2 refills | Status: DC

## 2021-07-05 NOTE — Progress Notes (Signed)
Provider was contacted by Carla Bracken regarding patient's refill requests. Patient's medication to be e-prescribed to pharmacy of choice.

## 2021-07-05 NOTE — Telephone Encounter (Signed)
Provider was contacted by Lauralee Evener regarding patient's refill requests. Patient's medication to be e-prescribed to pharmacy of choice.

## 2021-07-20 ENCOUNTER — Other Ambulatory Visit: Payer: Self-pay

## 2021-07-26 ENCOUNTER — Other Ambulatory Visit: Payer: Self-pay

## 2021-07-31 ENCOUNTER — Telehealth (HOSPITAL_COMMUNITY): Payer: Self-pay | Admitting: Physician Assistant

## 2021-07-31 NOTE — Telephone Encounter (Addendum)
VRAYLAR patient assistance has not received the application, information, or prescription. Forwarded request to RX patient assistance rep, Tresa Endo, for follow up.

## 2021-08-21 ENCOUNTER — Telehealth (HOSPITAL_COMMUNITY): Payer: No Payment, Other | Admitting: Physician Assistant

## 2021-09-17 ENCOUNTER — Other Ambulatory Visit: Payer: Self-pay

## 2021-09-18 ENCOUNTER — Other Ambulatory Visit: Payer: Self-pay

## 2021-09-18 MED ORDER — CARIPRAZINE HCL 1.5 MG PO CAPS
ORAL_CAPSULE | ORAL | 0 refills | Status: DC
  Filled 2021-09-18: qty 30, 30d supply, fill #0

## 2021-10-02 ENCOUNTER — Encounter (HOSPITAL_COMMUNITY): Payer: Self-pay | Admitting: Family

## 2021-10-02 ENCOUNTER — Telehealth (INDEPENDENT_AMBULATORY_CARE_PROVIDER_SITE_OTHER): Payer: No Payment, Other | Admitting: Family

## 2021-10-02 DIAGNOSIS — F319 Bipolar disorder, unspecified: Secondary | ICD-10-CM | POA: Diagnosis not present

## 2021-10-02 DIAGNOSIS — F411 Generalized anxiety disorder: Secondary | ICD-10-CM

## 2021-10-02 MED ORDER — LAMOTRIGINE 100 MG PO TABS: 100.0000 mg | ORAL_TABLET | Freq: Every day | ORAL | 2 refills | Status: DC

## 2021-10-02 MED ORDER — GABAPENTIN 400 MG PO CAPS: 400.0000 mg | ORAL_CAPSULE | Freq: Three times a day (TID) | ORAL | 2 refills | Status: DC

## 2021-10-02 MED ORDER — BUPROPION HCL ER (XL) 150 MG PO TB24: 150.0000 mg | ORAL_TABLET | ORAL | 2 refills | Status: DC

## 2021-10-02 MED ORDER — TRAZODONE HCL 50 MG PO TABS: 50.0000 mg | ORAL_TABLET | Freq: Every day | ORAL | 2 refills | Status: DC

## 2021-10-02 MED ORDER — CARIPRAZINE HCL 1.5 MG PO CAPS
ORAL_CAPSULE | ORAL | 0 refills | Status: DC
Start: 2021-10-02 — End: 2021-11-21

## 2021-10-02 NOTE — Progress Notes (Addendum)
Virtual Visit via Telephone Note  I connected with George Brady on 10/02/21 at  2:00 PM EST by telephone and verified that I am speaking with the correct person using two identifiers.  Location: Patient: home Provider: office   I discussed the limitations, risks, security and privacy concerns of performing an evaluation and management service by telephone and the availability of in person appointments. I also discussed with the patient that there may be a patient responsible charge related to this service. The patient expressed understanding and agreed to proceed.     I discussed the assessment and treatment plan with the patient. The patient was provided an opportunity to ask questions and all were answered. The patient agreed with the plan and demonstrated an understanding of the instructions.   The patient was advised to call back or seek an in-person evaluation if the symptoms worsen or if the condition fails to improve as anticipated.  I provided 15 minutes of non-face-to-face time during this encounter.   Oneta Rack, NP   [facility] MD/PA/NP OP Progress Note  10/02/2021 2:24 PM MEIR ELWOOD  MRN:  [id number]  Chief Complaint: George Brady reported " my medication are working for me, the vraylar helps with the mind chatter."  HPI: George Brady was evaluated telephonically for medication management.  Charted history of generalized anxiety disorder, bipolar, insomnia and major depressive disorder. he reports overall his mood feels stable.  States he has been taking medications as directed.  States "I definitely can feel a difference if I do not take my medications as directed."  Reported a delay in delivery with his medications, stating he was off his Vraylar for about 1 week.  States "I had the worst panic/anxiety attack " I definitely noticed a difference when I am not on my medication.   George Brady reports persistent depression for the past 30 years and has been stabilized ever since  his medications was adjusted.  He denies suicidal or homicidal ideations.  Denies auditory or visual hallucinations.  Rates his depression and anxiety 2-3 out of 10 with 10 being the worst.  Reports a good appetite.  States he is resting well throughout the night.  Does indicate mood irritability but states his mood swings are "few and far between."  Patient to follow-up with 6 weeks.  Medications was refilled.  Support, encouragement and  reassurance was provided.    Visit Diagnosis:    ICD-10-CM   1. Bipolar I disorder (HCC)  F31.9 buPROPion (WELLBUTRIN XL) 150 MG 24 hr tablet    traZODone (DESYREL) 50 MG tablet    lamoTRIgine (LAMICTAL) 100 MG tablet    2. Anxiety state  F41.1 buPROPion (WELLBUTRIN XL) 150 MG 24 hr tablet    gabapentin (NEURONTIN) 400 MG capsule      Past Psychiatric History:   Past Medical History:  Past Medical History:  Diagnosis Date   Hepatitis C    Manic depressive disorder (HCC)     Past Surgical History:  Procedure Laterality Date   ABDOMINAL SURGERY      Family Psychiatric History:   Family History: No family history on file.  Social History:  Social History   Socioeconomic History   Marital status: Married    Spouse name: Not on file   Number of children: Not on file   Years of education: Not on file   Highest education level: Not on file  Occupational History   Not on file  Tobacco Use   Smoking status: Every Day  Packs/day: 0.50    Types: Cigarettes   Smokeless tobacco: Never  Substance and Sexual Activity   Alcohol use: Yes    Comment: occasional   Drug use: Never   Sexual activity: Not on file  Other Topics Concern   Not on file  Social History Narrative   Not on file   Social Determinants of Health   Financial Resource Strain: Not on file  Food Insecurity: Not on file  Transportation Needs: Not on file  Physical Activity: Not on file  Stress: Not on file  Social Connections: Not on file    Allergies: Not on  File  Metabolic Disorder Labs: No results found for: HGBA1C, MPG No results found for: PROLACTIN No results found for: CHOL, TRIG, HDL, CHOLHDL, VLDL, LDLCALC No results found for: TSH  Therapeutic Level Labs: No results found for: LITHIUM No results found for: VALPROATE No components found for:  CBMZ  Current Medications: Current Outpatient Medications  Medication Sig Dispense Refill   buPROPion (WELLBUTRIN XL) 150 MG 24 hr tablet Take 1 tablet (150 mg total) by mouth every morning. 30 tablet 2   cariprazine (VRAYLAR) 1.5 MG capsule Take 1 capsule by mouth daily. 90 capsule 0   gabapentin (NEURONTIN) 400 MG capsule Take 1 capsule (400 mg total) by mouth 3 (three) times daily. 90 capsule 2   lamoTRIgine (LAMICTAL) 100 MG tablet Take 1 tablet (100 mg total) by mouth daily. 30 tablet 2   traZODone (DESYREL) 50 MG tablet Take 1 tablet (50 mg total) by mouth at bedtime. 30 tablet 2   No current facility-administered medications for this visit.     Musculoskeletal:  Psychiatric Specialty Exam: Review of Systems  There were no vitals taken for this visit.There is no height or weight on file to calculate BMI.  General Appearance: NA  Eye Contact:  NA  Speech:  Clear and Coherent  Volume:  Normal  Mood:  Anxious and Depressed  Affect:  Congruent  Thought Process:  Coherent  Orientation:  Full (Time, Place, and Person)  Thought Content: Logical   Suicidal Thoughts:  No  Homicidal Thoughts:  No  Memory:  Immediate;   Good Recent;   Good  Judgement:  Good  Insight:  Good  Psychomotor Activity:  Normal  Concentration:  Concentration: Good  Recall:  Good  Fund of Knowledge: Good  Language: Good  Akathisia:  No  Handed:  Right  AIMS (if indicated): done  Assets:  Communication Skills Desire for Improvement Social Support  ADL's:  Intact  Cognition: WNL  Sleep:  Fair   Screenings: GAD-7    Flowsheet Row Office Visit from 04/03/2021 in [facility] Office Visit from 01/31/2021 in [facility]  Total GAD-7 Score 2 3      PHQ2-9    Flowsheet Row Video Visit from 07/03/2021 in [facility] Office Visit from 04/03/2021 in [facility] Office Visit from 01/31/2021 in [facility]  PHQ-2 Total Score 0 1 0      Flowsheet Row Video Visit from 07/03/2021 in [facility] Office Visit from 04/03/2021 in [facility] Office Visit from 01/31/2021 in [facility]  C-SSRS RISK CATEGORY Low Risk Low Risk Low Risk        Assessment and Plan: George Brady 55 year old male has a charted history with bipolar disorder ,generalized anxiety disorder and  major depressive  disorder.  Reports taking and tolerating medications well.  States his mood has been stable while taking medications as directed.  He is denying any medication side effects.  I.e. headaches nausea vomiting or diarrhea.  Reports slight irritability however due to weather and inability to work today because and decreased concentration but overall his mood has been stabilized.  Patient to keep follow-up appointment 6 to 8 weeks.  Support encouragement reassurance was provided.  Major depressive disorder: Generalized anxiety disorder: Bipolar disorder:  Continue Wellbutrin 150 mg daily Continue Vraylar 1.5 mg daily Continue gabapentin 400 mg p.o. 3 times daily Continue Lamictal 100 mg daily Continue trazodone 50 mg daily p.o. nightly as needed  Patient to follow-up 6 to 8 weeks for medication management    Oneta Rack, NP 10/02/2021, 2:24 PM

## 2021-10-25 ENCOUNTER — Other Ambulatory Visit: Payer: Self-pay

## 2021-10-29 ENCOUNTER — Other Ambulatory Visit: Payer: Self-pay

## 2021-11-21 ENCOUNTER — Telehealth (INDEPENDENT_AMBULATORY_CARE_PROVIDER_SITE_OTHER): Payer: No Payment, Other | Admitting: Physician Assistant

## 2021-11-21 DIAGNOSIS — F319 Bipolar disorder, unspecified: Secondary | ICD-10-CM | POA: Diagnosis not present

## 2021-11-21 DIAGNOSIS — F411 Generalized anxiety disorder: Secondary | ICD-10-CM | POA: Diagnosis not present

## 2021-11-23 ENCOUNTER — Encounter (HOSPITAL_COMMUNITY): Payer: Self-pay | Admitting: Physician Assistant

## 2021-11-23 MED ORDER — BUPROPION HCL ER (XL) 150 MG PO TB24: 150.0000 mg | ORAL_TABLET | ORAL | 2 refills | Status: DC

## 2021-11-23 MED ORDER — LAMOTRIGINE 100 MG PO TABS: 100.0000 mg | ORAL_TABLET | Freq: Every day | ORAL | 2 refills | Status: DC

## 2021-11-23 MED ORDER — GABAPENTIN 400 MG PO CAPS: 400.0000 mg | ORAL_CAPSULE | Freq: Three times a day (TID) | ORAL | 2 refills | Status: DC

## 2021-11-23 MED ORDER — CARIPRAZINE HCL 1.5 MG PO CAPS: ORAL_CAPSULE | ORAL | 0 refills | Status: DC

## 2021-11-23 MED ORDER — TRAZODONE HCL 50 MG PO TABS: 50.0000 mg | ORAL_TABLET | Freq: Every day | ORAL | 2 refills | Status: DC

## 2021-11-23 NOTE — Progress Notes (Cosign Needed Addendum)
BH MD/PA/NP OP Progress Note  Virtual Visit via Telephone Note  I connected with George Brady on 11/21/21 at  3:30 PM EST by telephone and verified that I am speaking with the correct person using two identifiers.  Location: Patient: Home Provider: Clinic   I discussed the limitations, risks, security and privacy concerns of performing an evaluation and management service by telephone and the availability of in person appointments. I also discussed with the patient that there may be a patient responsible charge related to this service. The patient expressed understanding and agreed to proceed.  Follow Up Instructions:  I discussed the assessment and treatment plan with the patient. The patient was provided an opportunity to ask questions and all were answered. The patient agreed with the plan and demonstrated an understanding of the instructions.   The patient was advised to call back or seek an in-person evaluation if the symptoms worsen or if the condition fails to improve as anticipated.  I provided 11 minutes of non-face-to-face time during this encounter.  Malachy Mood, PA   11/21/2021 6:56 PM TYTON ROTTIER  MRN:  [id number]  Chief Complaint: Follow up and medication management  HPI:   George Brady is a 56 year old male with a past psychiatric history significant for bipolar disorder and anxiety presents to [facility] via virtual telephone visit for follow-up and medication management.  Patient is currently being managed on the following medications:  Bupropion (Wellbutrin XL) 150 mg 24-hour tablet daily Trazodone 50 mg at bedtime Lamotrigine 100 mg daily Gabapentin 400 mg 3 times daily Cariprazine 1.5 mg daily  Patient reports no issues or concerns regarding his current medication regimen.  Patient denies the need for dosage adjustments at this time and is requesting refills on all his medications following the  conclusion of the encounter.  Patient expresses that his medications have been effective in the management of his symptoms and denies experiencing any adverse side effects.  Patient denies any depressive symptoms but does note that he recently lost his uncle during the holiday.  He reports that his anxiety has been minimal and denies any new stressors at this time.  A PHQ-9 screen was performed with the patient scoring a 10.  A GAD-7 screen was also performed with the patient scoring a 2.  Patient is alert and oriented x4, pleasant, calm, cooperative, and fully engaged in conversation during the encounter.  Patient endorses happy mood.  Patient denies suicidal or homicidal ideations.  He further denies auditory or visual hallucinations and does not appear to be responding to internal/external stimuli.  Patient endorses fair sleep and receives on average 6 to 7 hours of sleep each night.  Patient endorses limited appetite stating that he is often not hungry.  He does report eating a good breakfast and lunch today.  Patient endorses alcohol consumption occasionally.  Patient endorses tobacco use and smokes on average a pack per day.  Patient denies illicit drug use.  Visit Diagnosis:    ICD-10-CM   1. Bipolar I disorder ([place])  F31.9     2. Anxiety state  F41.1       Past Psychiatric History:  Anxiety State Bipolar Disorder I Insomnia  Past Medical History:  Past Medical History:  Diagnosis Date   Hepatitis C    Manic depressive disorder ([place])     Past Surgical History:  Procedure Laterality Date   ABDOMINAL SURGERY      Family Psychiatric History:  Patient does not know if there are psychiatric illnesses on his mom's side  Family History: No family history on file.  Social History:  Social History   Socioeconomic History   Marital status: Married    Spouse name: Not on file   Number of children: Not on file   Years of education: Not on file   Highest education level: Not on  file  Occupational History   Not on file  Tobacco Use   Smoking status: Every Day    Packs/day: 0.50    Types: Cigarettes   Smokeless tobacco: Never  Substance and Sexual Activity   Alcohol use: Yes    Comment: occasional   Drug use: Never   Sexual activity: Not on file  Other Topics Concern   Not on file  Social History Narrative   Not on file   Social Determinants of Health   Financial Resource Strain: Not on file  Food Insecurity: Not on file  Transportation Needs: Not on file  Physical Activity: Not on file  Stress: Not on file  Social Connections: Not on file    Allergies: Not on File  Metabolic Disorder Labs: No results found for: HGBA1C, MPG No results found for: PROLACTIN No results found for: CHOL, TRIG, HDL, CHOLHDL, VLDL, LDLCALC No results found for: TSH  Therapeutic Level Labs: No results found for: LITHIUM No results found for: VALPROATE No components found for:  CBMZ  Current Medications: Current Outpatient Medications  Medication Sig Dispense Refill   buPROPion (WELLBUTRIN XL) 150 MG 24 hr tablet Take 1 tablet (150 mg total) by mouth every morning. 30 tablet 2   cariprazine (VRAYLAR) 1.5 MG capsule Take 1 capsule by mouth daily. 90 capsule 0   gabapentin (NEURONTIN) 400 MG capsule Take 1 capsule (400 mg total) by mouth 3 (three) times daily. 90 capsule 2   lamoTRIgine (LAMICTAL) 100 MG tablet Take 1 tablet (100 mg total) by mouth daily. 30 tablet 2   traZODone (DESYREL) 50 MG tablet Take 1 tablet (50 mg total) by mouth at bedtime. 30 tablet 2   No current facility-administered medications for this visit.     Musculoskeletal: Strength & Muscle Tone: Unable to assess due to telemedicine visit [place]: Unable to assess due to telemedicine visit Patient leans: Unable to assess due to telemedicine visit  Psychiatric Specialty Exam: Review of Systems  Psychiatric/Behavioral:  Negative for decreased concentration, dysphoric mood,  hallucinations, self-injury, sleep disturbance and suicidal ideas. The patient is not nervous/anxious and is not hyperactive.    There were no vitals taken for this visit.There is no height or weight on file to calculate BMI.  General Appearance: Unable to assess due to telemedicine visit  Eye Contact: Unable to assess due to telemedicine visit   Speech:  Clear and Coherent and Normal Rate  Volume:  Normal  Mood:  Euthymic  Affect:  Appropriate  Thought Process:  Coherent and Descriptions of Associations: Intact  Orientation:  Full (Time, Place, and Person)  Thought Content: WDL   Suicidal Thoughts:  No  Homicidal Thoughts:  No  Memory:  Immediate;   Good Recent;   Good Remote;   Good  Judgement:  Good  Insight:  Good  Psychomotor Activity:  Normal  Concentration:  Concentration: Good and Attention Span: Good  Recall:  Good  Fund of Knowledge: Good  Language: Good  Akathisia:  NA  Handed:  Right  AIMS (if indicated): not done  Assets:  Communication Skills Desire for  Improvement Housing Social Support Vocational/Educational  ADL's:  Intact  Cognition: WNL  Sleep:  Good   Screenings: GAD-7    Flowsheet Row Video Visit from 11/21/2021 in [facility] Office Visit from 04/03/2021 in [facility] Office Visit from 01/31/2021 in [facility]  Total GAD-7 Score 2 2 3      $ PHQ2-9    Flowsheet Row Video Visit from 11/21/2021 in [facility] Video Visit from 07/03/2021 in [facility] Office Visit from 04/03/2021 in [facility] Office Visit from 01/31/2021 in [facility]  PHQ-2 Total Score 3 0 1 0  PHQ-9 Total Score 10 -- -- --      Flowsheet Row Video Visit from 11/21/2021 in [facility] Video Visit from 07/03/2021 in [facility]  Office Visit from 04/03/2021 in [place] CATEGORY Low Risk Low Risk Low Risk        Assessment and Plan:   Tannor Pretorius is a 56 year old male with a past psychiatric history significant for bipolar disorder and anxiety presents to [facility] via virtual telephone visit for follow-up and medication management.  Patient reports no issues or concerns regarding his current medication regimen.  Patient denies the need for dosage adjustments at this time and is requesting refills on all his medications following the conclusion of the encounter.  Patient's medications to be e- prescribed to pharmacy of choice.  1. Bipolar I disorder (HCC)  - cariprazine (VRAYLAR) 1.5 MG capsule; Take 1 capsule by mouth daily.  Dispense: 90 capsule; Refill: 0 - lamoTRIgine (LAMICTAL) 100 MG tablet; Take 1 tablet (100 mg total) by mouth daily.  Dispense: 30 tablet; Refill: 2 - buPROPion (WELLBUTRIN XL) 150 MG 24 hr tablet; Take 1 tablet (150 mg total) by mouth every morning.  Dispense: 30 tablet; Refill: 2 - traZODone (DESYREL) 50 MG tablet; Take 1 tablet (50 mg total) by mouth at bedtime.  Dispense: 30 tablet; Refill: 2  2. Anxiety state  - buPROPion (WELLBUTRIN XL) 150 MG 24 hr tablet; Take 1 tablet (150 mg total) by mouth every morning.  Dispense: 30 tablet; Refill: 2 - gabapentin (NEURONTIN) 400 MG capsule; Take 1 capsule (400 mg total) by mouth 3 (three) times daily.  Dispense: 90 capsule; Refill: 2  Patient to follow up in 3 months Provider spent a total of 11 minutes with the patient/reviewing patient's chart  Malachy Mood, PA 11/21/2021, 6:56 PM

## 2022-02-20 ENCOUNTER — Telehealth (INDEPENDENT_AMBULATORY_CARE_PROVIDER_SITE_OTHER): Payer: No Payment, Other | Admitting: Physician Assistant

## 2022-02-20 ENCOUNTER — Encounter (HOSPITAL_COMMUNITY): Payer: Self-pay | Admitting: Physician Assistant

## 2022-02-20 DIAGNOSIS — F319 Bipolar disorder, unspecified: Secondary | ICD-10-CM

## 2022-02-20 DIAGNOSIS — F411 Generalized anxiety disorder: Secondary | ICD-10-CM | POA: Diagnosis not present

## 2022-02-20 MED ORDER — GABAPENTIN 400 MG PO CAPS: 400.0000 mg | ORAL_CAPSULE | Freq: Three times a day (TID) | ORAL | 2 refills | Status: DC

## 2022-02-20 MED ORDER — CARIPRAZINE HCL 1.5 MG PO CAPS: ORAL_CAPSULE | ORAL | 0 refills | Status: DC

## 2022-02-20 MED ORDER — LAMOTRIGINE 100 MG PO TABS: 100.0000 mg | ORAL_TABLET | Freq: Every day | ORAL | 2 refills | Status: DC

## 2022-02-20 MED ORDER — TRAZODONE HCL 50 MG PO TABS: 50.0000 mg | ORAL_TABLET | Freq: Every day | ORAL | 2 refills | Status: DC

## 2022-02-20 MED ORDER — BUPROPION HCL ER (XL) 150 MG PO TB24: 150.0000 mg | ORAL_TABLET | ORAL | 2 refills | Status: DC

## 2022-02-20 NOTE — Progress Notes (Addendum)
BH MD/PA/NP OP Progress Note ? ?Virtual Visit via Telephone Note ? ?I connected with George Brady on 02/20/22 at  3:30 PM EDT by telephone and verified that I am speaking with the correct person using two identifiers. ? ?Location: ?Patient: Home ?Provider: Clinic ?  ?I discussed the limitations, risks, security and privacy concerns of performing an evaluation and management service by telephone and the availability of in person appointments. I also discussed with the patient that there may be a patient responsible charge related to this service. The patient expressed understanding and agreed to proceed. ? ?Follow Up Instructions: ? ?I discussed the assessment and treatment plan with the patient. The patient was provided an opportunity to ask questions and all were answered. The patient agreed with the plan and demonstrated an understanding of the instructions. ?  ?The patient was advised to call back or seek an in-person evaluation if the symptoms worsen or if the condition fails to improve as anticipated. ? ?I provided 13 minutes of non-face-to-face time during this encounter. ? ?Meta Hatchet, PA ? ? ?02/20/2022 10:21 PM ?Rita Prom Gentle  ?MRN:  [id number] ? ?Chief Complaint:  ?Chief Complaint  ?Patient presents with  ? Medication Refill  ? Follow-up  ? ?HPI:  ? ?George Brady is a 56 year old male with a past psychiatric history significant for bipolar 1 disorder and anxiety who presents to [facility] via virtual telephone visit for follow-up and medication management.  Patient is currently being managed on the following medications: ? ?Bupropion (Wellbutrin XL) 150 mg 24-hour tablet daily ?Trazodone 50 mg at bedtime ?Lamotrigine 100 mg daily ?Gabapentin 400 mg 3 times daily ?Cariprazine 1.5 mg daily ? ?Patient reports no issues or concerns regarding his current medication regimen.  Patient denies the need for dosage adjustments at this time and is requesting  refills on all of his medications following the conclusion of the encounter.  Patient endorses some depressive symptoms related to stressors in his life.  Patient's depressive episodes are characterized by lack of interest and things.  Patient endorses mild anxiety and rates his anxiety at 2 out of 10.  Patient denies any other issues regarding his mental health.  A PHQ-9 screen was performed with the patient scoring an 11.  A GAD-7 screen was also performed with the patient scoring a 6. ? ?Patient is alert and oriented x4, calm, cooperative, and fully engaged in conversation during the encounter.  Patient endorses great mood.  Patient denies suicidal or homicidal ideations.  He further denies auditory or visual hallucinations and does not appear to be responding to internal/external stimuli.  Patient endorses good sleep and receives on average 6 to 7 hours of sleep each night.  Patient endorses decreased appetite and eats on average 2 meals per day.  Patient denies alcohol consumption and illicit drug use.  Patient endorses tobacco use and smokes on average a pack a day. ? ?Visit Diagnosis:  ?  ICD-10-CM   ?1. Bipolar I disorder (HCC)  F31.9 traZODone (DESYREL) 50 MG tablet  ?  buPROPion (WELLBUTRIN XL) 150 MG 24 hr tablet  ?  lamoTRIgine (LAMICTAL) 100 MG tablet  ?  cariprazine (VRAYLAR) 1.5 MG capsule  ?  ?2. Anxiety state  F41.1 gabapentin (NEURONTIN) 400 MG capsule  ?  buPROPion (WELLBUTRIN XL) 150 MG 24 hr tablet  ?  ? ? ?Past Psychiatric History:  ?Anxiety State ?Bipolar Disorder I ?Insomnia ? ?Past Medical History:  ?Past Medical History:  ?Diagnosis  Date  ? Hepatitis C   ? Manic depressive disorder (HCC)   ?  ?Past Surgical History:  ?Procedure Laterality Date  ? ABDOMINAL SURGERY    ? ? ?Family Psychiatric History:  ?Patient does not know if there are psychiatric illnesses on his mom's side ? ?Family History: History reviewed. No pertinent family history. ? ?Social History:  ?Social History   ? ?Socioeconomic History  ? Marital status: Married  ?  Spouse name: Not on file  ? Number of children: Not on file  ? Years of education: Not on file  ? Highest education level: Not on file  ?Occupational History  ? Not on file  ?Tobacco Use  ? Smoking status: Every Day  ?  Packs/day: 0.50  ?  Types: Cigarettes  ? Smokeless tobacco: Never  ?Substance and Sexual Activity  ? Alcohol use: Yes  ?  Comment: occasional  ? Drug use: Never  ? Sexual activity: Not on file  ?Other Topics Concern  ? Not on file  ?Social History Narrative  ? Not on file  ? ?Social Determinants of Health  ? ?Financial Resource Strain: Not on file  ?Food Insecurity: Not on file  ?Transportation Needs: Not on file  ?Physical Activity: Not on file  ?Stress: Not on file  ?Social Connections: Not on file  ? ? ?Allergies: Not on File ? ?Metabolic Disorder Labs: ?No results found for: HGBA1C, MPG ?No results found for: PROLACTIN ?No results found for: CHOL, TRIG, HDL, CHOLHDL, VLDL, LDLCALC ?No results found for: TSH ? ?Therapeutic Level Labs: ?No results found for: LITHIUM ?No results found for: VALPROATE ?No components found for:  CBMZ ? ?Current Medications: ?Current Outpatient Medications  ?Medication Sig Dispense Refill  ? buPROPion (WELLBUTRIN XL) 150 MG 24 hr tablet Take 1 tablet (150 mg total) by mouth every morning. 30 tablet 2  ? cariprazine (VRAYLAR) 1.5 MG capsule Take 1 capsule by mouth daily. 90 capsule 0  ? gabapentin (NEURONTIN) 400 MG capsule Take 1 capsule (400 mg total) by mouth 3 (three) times daily. 90 capsule 2  ? lamoTRIgine (LAMICTAL) 100 MG tablet Take 1 tablet (100 mg total) by mouth daily. 30 tablet 2  ? traZODone (DESYREL) 50 MG tablet Take 1 tablet (50 mg total) by mouth at bedtime. 30 tablet 2  ? ?No current facility-administered medications for this visit.  ? ? ? ?Musculoskeletal: ?Strength & Muscle Tone: Unable to assess due to telemedicine visit ?Gait & Station: Unable to assess due to telemedicine visit ?Patient  leans: Unable to assess due to telemedicine visit ? ?Psychiatric Specialty Exam: ?Review of Systems  ?Psychiatric/Behavioral:  Negative for decreased concentration, dysphoric mood, hallucinations, self-injury, sleep disturbance and suicidal ideas. The patient is not nervous/anxious and is not hyperactive.    ?There were no vitals taken for this visit.There is no height or weight on file to calculate BMI.  ?General Appearance: Unable to assess due to telemedicine visit  ?Eye Contact:  Unable to assess due to telemedicine visit  ?Speech:  Clear and Coherent and Normal Rate  ?Volume:  Normal  ?Mood:  Euthymic  ?Affect:  Appropriate  ?Thought Process:  Coherent and Descriptions of Associations: Intact  ?Orientation:  Full (Time, Place, and Person)  ?Thought Content: WDL   ?Suicidal Thoughts:  No  ?Homicidal Thoughts:  No  ?Memory:  Immediate;   Good ?Recent;   Good ?Remote;   Good  ?Judgement:  Good  ?Insight:  Good  ?Psychomotor Activity:  Normal  ?Concentration:  Concentration: Good  and Attention Span: Good  ?Recall:  Good  ?Fund of Knowledge: Good  ?Language: Good  ?Akathisia:  No  ?Handed:  Right  ?AIMS (if indicated): not done  ?Assets:  Communication Skills ?Desire for Improvement ?Housing ?Social Support ?Vocational/Educational  ?ADL's:  Intact  ?Cognition: WNL  ?Sleep:  Good  ? ?Screenings: ?GAD-7   ? ?Flow[facility] Row [facility]  ?Total GAD-7 Score 6 2 2 3   ? ?  ? ?PHQ2-9   ? ?Flowsheet Row [facility]  ?PHQ-2 Total Score 3 3 0 1 0  ?PHQ-9 Total Score 11 10 -- -- --  ? ?  ? ?Flowsheet Row [place]

## 2022-05-22 ENCOUNTER — Telehealth (HOSPITAL_COMMUNITY): Payer: No Payment, Other | Admitting: Physician Assistant

## 2022-05-22 ENCOUNTER — Ambulatory Visit (INDEPENDENT_AMBULATORY_CARE_PROVIDER_SITE_OTHER): Payer: No Payment, Other | Admitting: Psychiatry

## 2022-05-22 DIAGNOSIS — F319 Bipolar disorder, unspecified: Secondary | ICD-10-CM

## 2022-05-22 DIAGNOSIS — F411 Generalized anxiety disorder: Secondary | ICD-10-CM

## 2022-05-22 MED ORDER — CARIPRAZINE HCL 1.5 MG PO CAPS: 1.5000 mg | ORAL_CAPSULE | Freq: Every day | ORAL | 2 refills | Status: DC

## 2022-05-22 MED ORDER — GABAPENTIN 400 MG PO CAPS: 400.0000 mg | ORAL_CAPSULE | Freq: Three times a day (TID) | ORAL | 2 refills | Status: DC

## 2022-05-22 MED ORDER — LAMOTRIGINE 100 MG PO TABS: 100.0000 mg | ORAL_TABLET | Freq: Every day | ORAL | 2 refills | Status: DC

## 2022-05-22 MED ORDER — BUPROPION HCL 75 MG PO TABS: 75.0000 mg | ORAL_TABLET | Freq: Every day | ORAL | 0 refills | Status: DC

## 2022-05-22 MED ORDER — TRAZODONE HCL 50 MG PO TABS: 50.0000 mg | ORAL_TABLET | Freq: Every day | ORAL | 2 refills | Status: DC

## 2022-05-22 NOTE — Progress Notes (Signed)
BH MD/PA/NP OP Progress Note  Virtual Visit via Telephone Note  I connected with George Brady on 05/22/22 at  9:00 AM EDT by telephone and verified that I am speaking with the correct person using two identifiers.  Location: Patient: Office Provider: Clinic   I discussed the limitations, risks, security and privacy concerns of performing an evaluation and management service by telephone and the availability of in person appointments. I also discussed with the patient that there may be a patient responsible charge related to this service. The patient expressed understanding and agreed to proceed.  Follow Up Instructions:  I discussed the assessment and treatment plan with the patient. The patient was provided an opportunity to ask questions and all were answered. The patient agreed with the plan and demonstrated an understanding of the instructions.   The patient was advised to call back or seek an in-person evaluation if the symptoms worsen or if the condition fails to improve as anticipated.  I provided 20 minutes of non-face-to-face time during this encounter.  George Ro, MD   05/22/2022 10:03 AM George Brady  MRN:  [id number]  Chief Complaint:  Medication management  HPI:   George Brady is a 56 year old male with a past psychiatric history significant for bipolar 1 disorder and anxiety who presents to [facility] via virtual telephone visit for follow-up and medication management.  Patient is currently being managed on the following medications:  Bupropion (Wellbutrin XL) 150 mg 24-hour tablet daily Trazodone 50 mg at bedtime Lamotrigine 100 mg daily Gabapentin 400 mg 3 times daily Cariprazine 1.5 mg daily  He reports that his mood is positive and he does not feel depressed but has some anxiety with some panic attacks due to feeling overwhelmed.  He reports that he is able to manage his anxiety at this time.  He  reports that he is excited and getting married in 2 weeks.  Patient endorses decreased appetite and lost about 20 pounds of weight. He has not been taking his Vraylar due to insurance issues, states that he will be able to get it through [employer] soon. Patient is alert and oriented x4, calm, cooperative, and fully engaged in conversation during the encounter. Patient endorses great mood.  Patient denies suicidal or homicidal ideations.  He denies auditory or visual hallucinations. Patient endorses good sleep. Patient denies alcohol consumption and illicit drug use. Discussed that his weight loss and decreased appetite is likely due to Wellbutrin.  Discussed tapering Wellbutrin to 75 mg and starting a different antidepressant at future visits.  He agrees with the plan. Visit Diagnosis:    ICD-10-CM   1. Bipolar I disorder (HCC)  F31.9 buPROPion (WELLBUTRIN) 75 MG tablet    2. Anxiety state  F41.1       Past Psychiatric History:  Anxiety State Bipolar Disorder I Insomnia  Past Medical History:  Past Medical History:  Diagnosis Date   Hepatitis C    Manic depressive disorder (HCC)     Past Surgical History:  Procedure Laterality Date   ABDOMINAL SURGERY      Family Psychiatric History:  Patient does not know if there are psychiatric illnesses on his mom's side  Family History: No family history on file.  Social History:  Social History   Socioeconomic History   Marital status: Married    Spouse name: Not on file   Number of children: Not on file   Years of education: Not on file  Highest education level: Not on file  Occupational History   Not on file  Tobacco Use   Smoking status: Every Day    Packs/day: 0.50    Types: Cigarettes   Smokeless tobacco: Never  Substance and Sexual Activity   Alcohol use: Yes    Comment: occasional   Drug use: Never   Sexual activity: Not on file  Other Topics Concern   Not on file  Social History Narrative   Not on file   Social  Determinants of Health   Financial Resource Strain: Not on file  Food Insecurity: Not on file  Transportation Needs: Not on file  Physical Activity: Not on file  Stress: Not on file  Social Connections: Not on file    Allergies: Not on File  Metabolic Disorder Labs: No results found for: "HGBA1C", "MPG" No results found for: "PROLACTIN" No results found for: "CHOL", "TRIG", "HDL", "CHOLHDL", "VLDL", "LDLCALC" No results found for: "TSH"  Therapeutic Level Labs: No results found for: "LITHIUM" No results found for: "VALPROATE" No results found for: "CBMZ"  Current Medications: Current Outpatient Medications  Medication Sig Dispense Refill   buPROPion (WELLBUTRIN) 75 MG tablet Take 1 tablet (75 mg total) by mouth daily. 30 tablet 0   cariprazine (VRAYLAR) 1.5 MG capsule Take 1 capsule by mouth daily. 90 capsule 0   gabapentin (NEURONTIN) 400 MG capsule Take 1 capsule (400 mg total) by mouth 3 (three) times daily. 90 capsule 2   lamoTRIgine (LAMICTAL) 100 MG tablet Take 1 tablet (100 mg total) by mouth daily. 30 tablet 2   traZODone (DESYREL) 50 MG tablet Take 1 tablet (50 mg total) by mouth at bedtime. 30 tablet 2   No current facility-administered medications for this visit.     Musculoskeletal: Strength & Muscle Tone: Unable to assess due to telemedicine visit Gait & Station: Unable to assess due to telemedicine visit Patient leans: Unable to assess due to telemedicine visit  Psychiatric Specialty Exam: Review of Systems  Psychiatric/Behavioral:  Negative for decreased concentration, dysphoric mood, hallucinations, self-injury, sleep disturbance and suicidal ideas. The patient is not nervous/anxious and is not hyperactive.     There were no vitals taken for this visit.There is no height or weight on file to calculate BMI.  General Appearance: Unable to assess due to telemedicine visit  Eye Contact:  Unable to assess due to telemedicine visit  Speech:  Clear and  Coherent and Normal Rate  Volume:  Normal  Mood:  Euthymic  Affect:  Appropriate  Thought Process:  Coherent and Descriptions of Associations: Intact  Orientation:  Full (Time, Place, and Person)  Thought Content: WDL   Suicidal Thoughts:  No  Homicidal Thoughts:  No  Memory:  Immediate;   Good Recent;   Good Remote;   Good  Judgement:  Good  Insight:  Good  Psychomotor Activity:  Unable to assess due to telemedicine visit  Concentration:  Concentration: Good and Attention Span: Good  Recall:  Good  Fund of Knowledge: Good  Language: Good  Akathisia: Unable to assess due to telemedicine visit  Handed:  Right  AIMS (if indicated): Unable to do due to telemedicine visit  Assets:  Communication Skills Desire for Improvement Housing Social Support Vocational/Educational  ADL's:  Intact  Cognition: WNL  Sleep:  Good   Screenings: GAD-7    Flowsheet Row [facility]20[facility]20[facility] Video Visit from 11/21/2021 in [facility] Office Visit from 04/03/2021 in  [facility] Office Visit from 01/31/2021 in [facility]  Total GAD-7 Score 6 2 2 3       PHQ2-9    Flowsheet Row Video Visit from 02/20/2022 in [facility] Video Visit from 11/21/2021 in [facility] Video Visit from 07/03/2021 in [facility] Office Visit from 04/03/2021 in [facility] Office Visit from 01/31/2021 in [facility]  PHQ-2 Total Score 3 3 0 1 0  PHQ-9 Total Score 11 10 -- -- --      Flowsheet Row Video Visit from 02/20/2022 in [facility] Video Visit from 11/21/2021 in [facility] Video Visit from 07/03/2021 in [facility]  C-SSRS RISK CATEGORY Low Risk Low Risk Low Risk         Assessment and Plan:   George Brady is a 56 year old male with a past psychiatric history significant for bipolar 1 disorder and anxiety who presents to [facility] via virtual telephone visit for follow-up and medication management.  Patient reports poor appetite and weight loss but otherwise reports no issues or concerns regarding his current medication regimen.  He endorses some anxiety related to stressors in his life which is manageable at this time.  Likely cause for poor appetite is Wellbutrin.  Discussed tapering and decreasing the dose for Wellbutrin to 75 mg to help with poor appetite.  Patient agrees with the plan patient's medications to be e-prescribed to pharmacy of choice.  Collaboration of Care: Collaboration of Care: Medication Management AEB provider managing patient's psychiatric medications and Psychiatrist AEB patient being followed by a mental health provider  Patient/Guardian was advised Release of Information must be obtained prior to any record release in order to collaborate their care with an outside provider. Patient/Guardian was advised if they have not already done so to contact the registration department to sign all necessary forms in order for [facility] to release information regarding their care.   Consent: Patient/Guardian gives verbal consent for treatment and assignment of benefits for services provided during this visit. Patient/Guardian expressed understanding and agreed to proceed.   1. Bipolar I disorder (HCC)  - traZODone (DESYREL) 50 MG tablet; Take 1 tablet (50 mg total) by mouth at bedtime.  Dispense: 30 tablet; Refill: 2 - Tapering buPROPion (WELLBUTRIN) to 75 MG Take 1 tablet (75 mg total) by mouth every morning.  Dispense: 30 tablet; Refill: 0 - lamoTRIgine (LAMICTAL) 100 MG tablet; Take 1 tablet (100 mg total) by mouth daily.  Dispense: 30 tablet; Refill: 2 - cariprazine (VRAYLAR) 1.5 MG capsule; Take 1  capsule by mouth daily.  Dispense: 30 capsule; Refill: 2  2. Anxiety state  - gabapentin (NEURONTIN) 400 MG capsule; Take 1 capsule (400 mg total) by mouth 3 (three) times daily.  Dispense: 90 capsule; Refill: 2 - buPROPion (WELLBUTRIN) 75 MG tablet, Take 1 tablet (150 mg total) by mouth every morning. Dispense: 30 tablet; Refill: 0  Patient to follow up in 3-4 weeks Provider spent a total of 20 minutes with the patient/reviewing patient's chart  [place], MD PGY3 05/22/2022, 10:03 AM

## 2022-05-22 NOTE — Patient Instructions (Signed)
3-4 weeks

## 2022-05-28 ENCOUNTER — Encounter (HOSPITAL_COMMUNITY): Payer: Self-pay | Admitting: Psychiatry

## 2022-08-22 ENCOUNTER — Other Ambulatory Visit (HOSPITAL_COMMUNITY): Payer: Self-pay | Admitting: Physician Assistant

## 2022-08-22 DIAGNOSIS — F411 Generalized anxiety disorder: Secondary | ICD-10-CM

## 2022-08-22 DIAGNOSIS — F319 Bipolar disorder, unspecified: Secondary | ICD-10-CM

## 2022-09-06 ENCOUNTER — Telehealth (HOSPITAL_COMMUNITY): Payer: Self-pay | Admitting: *Deleted

## 2022-09-06 ENCOUNTER — Other Ambulatory Visit (HOSPITAL_COMMUNITY): Payer: Self-pay | Admitting: Physician Assistant

## 2022-09-06 DIAGNOSIS — F319 Bipolar disorder, unspecified: Secondary | ICD-10-CM

## 2022-09-06 MED ORDER — BUPROPION HCL ER (XL) 150 MG PO TB24: 150.0000 mg | ORAL_TABLET | ORAL | 2 refills | Status: DC

## 2022-09-06 NOTE — Telephone Encounter (Signed)
VM left that sounded angry not understanding why he couldn't get his medicine. I called him back and woman answered saying he was in my building right now. I have been made unaware of this but he sees Dr Rosita Kea who is here this pm.

## 2022-09-06 NOTE — Progress Notes (Signed)
Provider was contacted by George Brady regarding patient's request for Wellbutrin prescription to be readjusted back to original dose. Patient's medication to be e-prescribed to pharmacy of choice.

## 2022-09-13 ENCOUNTER — Ambulatory Visit (INDEPENDENT_AMBULATORY_CARE_PROVIDER_SITE_OTHER): Payer: No Payment, Other | Admitting: Psychiatry

## 2022-09-13 DIAGNOSIS — F411 Generalized anxiety disorder: Secondary | ICD-10-CM

## 2022-09-13 DIAGNOSIS — F3175 Bipolar disorder, in partial remission, most recent episode depressed: Secondary | ICD-10-CM | POA: Diagnosis not present

## 2022-09-13 MED ORDER — GABAPENTIN 400 MG PO CAPS: 400.0000 mg | ORAL_CAPSULE | Freq: Three times a day (TID) | ORAL | 2 refills | Status: DC

## 2022-09-13 MED ORDER — LAMOTRIGINE 100 MG PO TABS
100.0000 mg | ORAL_TABLET | Freq: Every day | ORAL | 2 refills | Status: DC
Start: 2022-09-13 — End: 2023-01-09

## 2022-09-13 MED ORDER — MIRTAZAPINE 7.5 MG PO TABS: 7.5000 mg | ORAL_TABLET | Freq: Every day | ORAL | 1 refills | Status: DC

## 2022-09-13 MED ORDER — BUPROPION HCL 75 MG PO TABS: 75.0000 mg | ORAL_TABLET | Freq: Every morning | ORAL | 0 refills | Status: DC

## 2022-09-13 NOTE — Progress Notes (Signed)
[place] MD/PA/NP OP Progress Note  Virtual Visit via Telephone Note  I connected with George Brady on 09/13/22 at  2:30 PM EDT by telephone and verified that I am speaking with the correct person using two identifiers.  Location: Patient: Office Provider: Clinic   I discussed the limitations, risks, security and privacy concerns of performing an evaluation and management service by telephone and the availability of in person appointments. I also discussed with the patient that there may be a patient responsible charge related to this service. The patient expressed understanding and agreed to proceed.  Follow Up Instructions:  I discussed the assessment and treatment plan with the patient. The patient was provided an opportunity to ask questions and all were answered. The patient agreed with the plan and demonstrated an understanding of the instructions.   The patient was advised to call back or seek an in-person evaluation if the symptoms worsen or if the condition fails to improve as anticipated.  I provided 20 minutes of non-face-to-face time during this encounter.  [name], MD   09/13/2022 2:57 PM George Brady  MRN:  [id number]  Chief Complaint:  Medication management  HPI:   George Brady is a 56 year old male with a past psychiatric history significant for bipolar disorder and anxiety who presents to [facility] via virtual telephone visit for follow-up and medication management.  Patient is currently being managed on the following medications:  Bupropion (Wellbutrin XL) 150 mg 24-hour tablet daily (he was recommended to decrease dose to 75 mg but patient has not decreased it) Trazodone 50 mg at bedtime Lamotrigine 100 mg daily Gabapentin 400 mg 3 times daily Cariprazine 1.5 mg daily (has not been taking it due to insurance issues)  He reports that he has been doing better.  He reports that he does not feel happy or  sad but his mood has been stable.  He denies feeling angry or irritable.  He reports that his wife is in the hospital with C. difficile infection but she is doing better now.  He reports difficulty in sleep and poor appetite.  He reports some panic attacks.  He reports that he is able to manage his anxiety at this time. Patient endorses weight loss of about 20 pounds since starting Wellbutrin. He has not been taking his Vraylar due to insurance issues.  He reports that he has been taking Wellbutrin for about 16 years and his mood has been stable but also caused loss of weight and poor appetite.  He reports that when he was not on medications he was out of control, lashing out on people, irritable, not sleeping for days, and was speaking faster.  He reports that he was also smoking marijuana and drinking alcohol at that time. He reports that the last time he felt like this was 16 years ago and he has not had any manic symptoms or episodes in last 16 years.  He denies using any illicit drugs or getting alcohol at this time.  He reports that he does not want to start Zoloft, or Paxil type of drugs as a lot of his friends are on these medications and do not like it.  He tried Seroquel, Abilify which did not help.  He wants to gain weight and need something for sleep, depression and appetite. At last telephone visit, patient was given instruction to decrease dose of Wellbutrin to 75 mg and stop after 1 month.  He has been taking  150 mg and has not decreased dose.  He reports that he wants to feel happy again.  Discussed tapering Wellbutrin to 75 mg for 10 days and stopping it and starting Remeron 7.5 mg nightly after stopping Wellbutrin.  He agrees with the plan.    Patient is alert and oriented x4, calm, cooperative, and fully engaged in conversation during the encounter.  Patient denies suicidal or homicidal ideations.  He denies auditory or visual hallucinations.  Visit Diagnosis:    ICD-10-CM   1. Bipolar  disorder, in partial remission, most recent episode depressed (HCC)  F31.75 mirtazapine (REMERON) 7.5 MG tablet    buPROPion (WELLBUTRIN) 75 MG tablet    lamoTRIgine (LAMICTAL) 100 MG tablet    2. Anxiety state  F41.1 gabapentin (NEURONTIN) 400 MG capsule      Past Psychiatric History:  Anxiety State Bipolar Disorder I Insomnia  Past Medical History:  Past Medical History:  Diagnosis Date   Hepatitis C    Manic depressive disorder ([place])     Past Surgical History:  Procedure Laterality Date   ABDOMINAL SURGERY      Family Psychiatric History:  Patient does not know if there are psychiatric illnesses on his mom's side  Family History: No family history on file.  Social History:  Social History   Socioeconomic History   Marital status: Married    Spouse name: Not on file   Number of children: Not on file   Years of education: Not on file   Highest education level: Not on file  Occupational History   Not on file  Tobacco Use   Smoking status: Every Day    Packs/day: 0.50    Types: Cigarettes   Smokeless tobacco: Never  Substance and Sexual Activity   Alcohol use: Yes    Comment: occasional   Drug use: Never   Sexual activity: Not on file  Other Topics Concern   Not on file  Social History Narrative   Not on file   Social Determinants of Health   Financial Resource Strain: Not on file  Food Insecurity: Not on file  Transportation Needs: Not on file  Physical Activity: Not on file  Stress: Not on file  Social Connections: Not on file    Allergies: Not on File  Metabolic Disorder Labs: No results found for: "HGBA1C", "MPG" No results found for: "PROLACTIN" No results found for: "CHOL", "TRIG", "HDL", "CHOLHDL", "VLDL", "LDLCALC" No results found for: "TSH"  Therapeutic Level Labs: No results found for: "LITHIUM" No results found for: "VALPROATE" No results found for: "CBMZ"  Current Medications: Current Outpatient Medications  Medication Sig  Dispense Refill   buPROPion (WELLBUTRIN) 75 MG tablet Take 1 tablet (75 mg total) by mouth in the morning. 10 tablet 0   mirtazapine (REMERON) 7.5 MG tablet Take 1 tablet (7.5 mg total) by mouth at bedtime. 30 tablet 1   cariprazine (VRAYLAR) 1.5 MG capsule Take 1 capsule (1.5 mg total) by mouth daily. 30 capsule 2   gabapentin (NEURONTIN) 400 MG capsule Take 1 capsule (400 mg total) by mouth 3 (three) times daily. 90 capsule 2   lamoTRIgine (LAMICTAL) 100 MG tablet Take 1 tablet (100 mg total) by mouth daily. 30 tablet 2   traZODone (DESYREL) 50 MG tablet Take 1 tablet (50 mg total) by mouth at bedtime. 30 tablet 2   No current facility-administered medications for this visit.     Musculoskeletal: Strength & Muscle Tone: Unable to assess due to telemedicine visit Gait &  Station: Unable to assess due to telemedicine visit Patient leans: Unable to assess due to telemedicine visit  Psychiatric Specialty Exam: Review of Systems  Psychiatric/Behavioral:  Negative for decreased concentration, dysphoric mood, hallucinations, self-injury, sleep disturbance and suicidal ideas. The patient is not nervous/anxious and is not hyperactive.     There were no vitals taken for this visit.There is no height or weight on file to calculate BMI.  General Appearance: Unable to assess due to telemedicine visit  Eye Contact:  Unable to assess due to telemedicine visit  Speech:  Clear and Coherent and Normal Rate  Volume:  Normal  Mood:  Euthymic  Affect:  Appropriate  Thought Process:  Coherent and Descriptions of Associations: Intact  Orientation:  Full (Time, Place, and Person)  Thought Content: WDL   Suicidal Thoughts:  No  Homicidal Thoughts:  No  Memory:  Immediate;   Good Recent;   Good Remote;   Good  Judgement:  Good  Insight:  Good  Psychomotor Activity:  Unable to assess due to telemedicine visit  Concentration:  Concentration: Good and Attention Span: Good  Recall:  Good  Fund of  Knowledge: Good  Language: Good  Akathisia: Unable to assess due to telemedicine visit  Handed:  Right  AIMS (if indicated): Unable to do due to telemedicine visit  Assets:  Communication Skills Desire for Improvement Housing Social Support Vocational/Educational  ADL's:  Intact  Cognition: WNL  Sleep:  Poor   Screenings: GAD-7    Flowsheet Row Video Visit from 02/20/2022 in [facility] Video Visit from 11/21/2021 in [facility] Office Visit from 04/03/2021 in [facility] Office Visit from 01/31/2021 in [facility]  Total GAD-7 Score 6 2 2 3       PHQ2-9    Flowsheet Row Video Visit from 02/20/2022 in [facility] Video Visit from 11/21/2021 in [facility] Video Visit from 07/03/2021 in [facility] Office Visit from 04/03/2021 in [facility] Office Visit from 01/31/2021 in [facility]  PHQ-2 Total Score 3 3 0 1 0  PHQ-9 Total Score 11 10 -- -- --      Flowsheet Row Video Visit from 02/20/2022 in [facility] Video Visit from 11/21/2021 in [facility] Video Visit from 07/03/2021 in [facility] CATEGORY Low Risk Low Risk Low Risk        Assessment and Plan:   George Brady is a 56 year old male with a past psychiatric history significant for bipolar 1 disorder and anxiety who presents to [facility] via virtual telephone visit for follow-up and medication management.  Patient reports poor appetite, poor sleep ,weight loss and stable mood.  He denies any manic symptoms or episodes at this time.  At last telephone visit, patient was given instruction to decrease dose of Wellbutrin to 75 mg and stop after  1 month.  He has been taking 150 mg and has not decreased dose.  Will taper Wellbutrin to 75 mg for 10 days and then stop and starting Remeron 7.5 mg nightly after stopping Wellbutrin.    Patient agrees with the plan patient's medications to be e-prescribed to pharmacy of choice.  Collaboration of Care: Collaboration of Care: Dr. Sande Rives Patient/Guardian was advised Release of Information must be obtained prior to any record  release in order to collaborate their care with an outside provider. Patient/Guardian was advised if they have not already done so to contact the registration department to sign all necessary forms in order for [place] to release information regarding their care.   Consent: Patient/Guardian gives verbal consent for treatment and assignment of benefits for services provided during this visit. Patient/Guardian expressed understanding and agreed to proceed.   1. Bipolar disorder, in partial remission, most recent episode depressed (HCC)  - traZODone (DESYREL) 50 MG tablet; Take 1 tablet (50 mg total) by mouth at bedtime.  Dispense: 30 tablet; Refill: 2 - Tapering buPROPion (WELLBUTRIN) to 75 MG Take 1 tablet (75 mg total) by mouth every morning for 10 days and then stop.  Dispense: 10 tablet; Refill: 0 - lamoTRIgine (LAMICTAL) 100 MG tablet; Take 1 tablet (100 mg total) by mouth daily.  Dispense: 30 tablet; Refill: 2 - cariprazine (VRAYLAR) 1.5 MG capsule daily(he has not been taking regular due to insurance issues) -Start Mirtazapine 7.5 mg nightly after stopping Wellbutrin for depression, poor sleep and appetite .  30-day prescription with 1 refill sent to patient's pharmacy.  2. Anxiety state  - gabapentin (NEURONTIN) 400 MG capsule; Take 1 capsule (400 mg total) by mouth 3 (three) times daily.  Dispense: 90 capsule; Refill: 2 Patient to follow up in 4-6 weeks Provider spent a total of 20 minutes with the patient/reviewing patient's chart  [name], MD PGY3 09/13/2022,  2:57 PM

## 2022-09-24 ENCOUNTER — Encounter (HOSPITAL_COMMUNITY): Payer: Self-pay | Admitting: Psychiatry

## 2022-09-30 ENCOUNTER — Telehealth (HOSPITAL_COMMUNITY): Payer: Self-pay | Admitting: *Deleted

## 2022-09-30 NOTE — Telephone Encounter (Signed)
Patient called Stated he need Refills  & then says "he's not sure but he was put on a new medication"? Please return call to patient

## 2022-10-04 ENCOUNTER — Other Ambulatory Visit: Payer: Self-pay

## 2022-10-04 ENCOUNTER — Telehealth (HOSPITAL_COMMUNITY): Payer: Self-pay | Admitting: *Deleted

## 2022-10-04 NOTE — Telephone Encounter (Signed)
PATIENT CALLED VERY ANXIOUS THAT HIS NEW MEDICATION SHOULD GO TO [facility]   *I CALLED [facility] THEY AREN'T AWARE  OF WHAT MEDICATION HE'S TALKING ABOUT  *CALLED [facility] & THEY AREN'T AWARE OF ANY NEW MEDICATION    *CALLED PATIENT BACK TO VERIFY MEDICATION NAME & SAYS HE DOESN'T KNOW THE DOCTOR ORDERED IT

## 2022-10-04 NOTE — Telephone Encounter (Signed)
PATIENT CALLED VERY ANXIOUS THAT HIS NEW MEDICATION SHOULD GO TO [facility]   *I CALLED [facility] THEY AREN'T AWARE  OF WHAT MEDICATION HE'S TALKING ABOUT  *CALLED [facility] & THEY AREN'T AWARE OF ANY NEW MEDICATION    *CALLED PATIENT BACK TO VERIFY MEDICATION NAME & SAYS HE DOESN'T KNOW THE DOCTOR ORDERED IT 

## 2022-10-07 IMAGING — CR DG FOOT COMPLETE 3+V*L*
3 series · 3 of 3 positions shown · non-contrast
Comparison: None.

CLINICAL DATA: Pain following twisting injury

EXAM:
LEFT FOOT - COMPLETE 3+ VIEW

[t foot ap left]
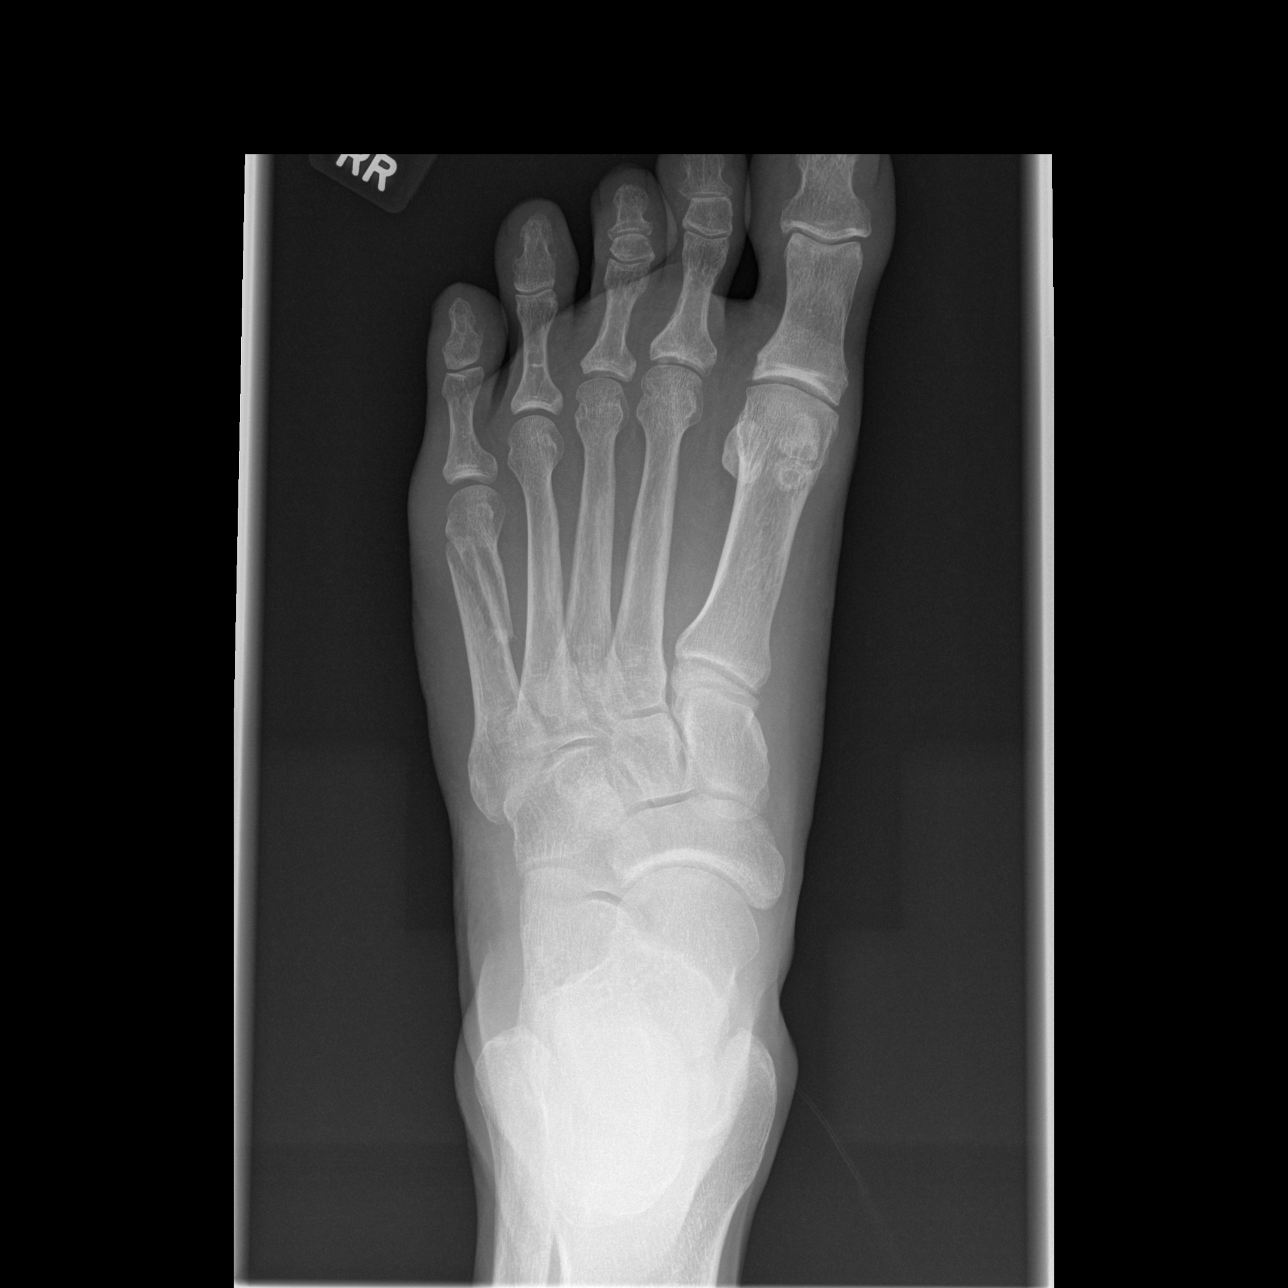

[t foot oblique left]
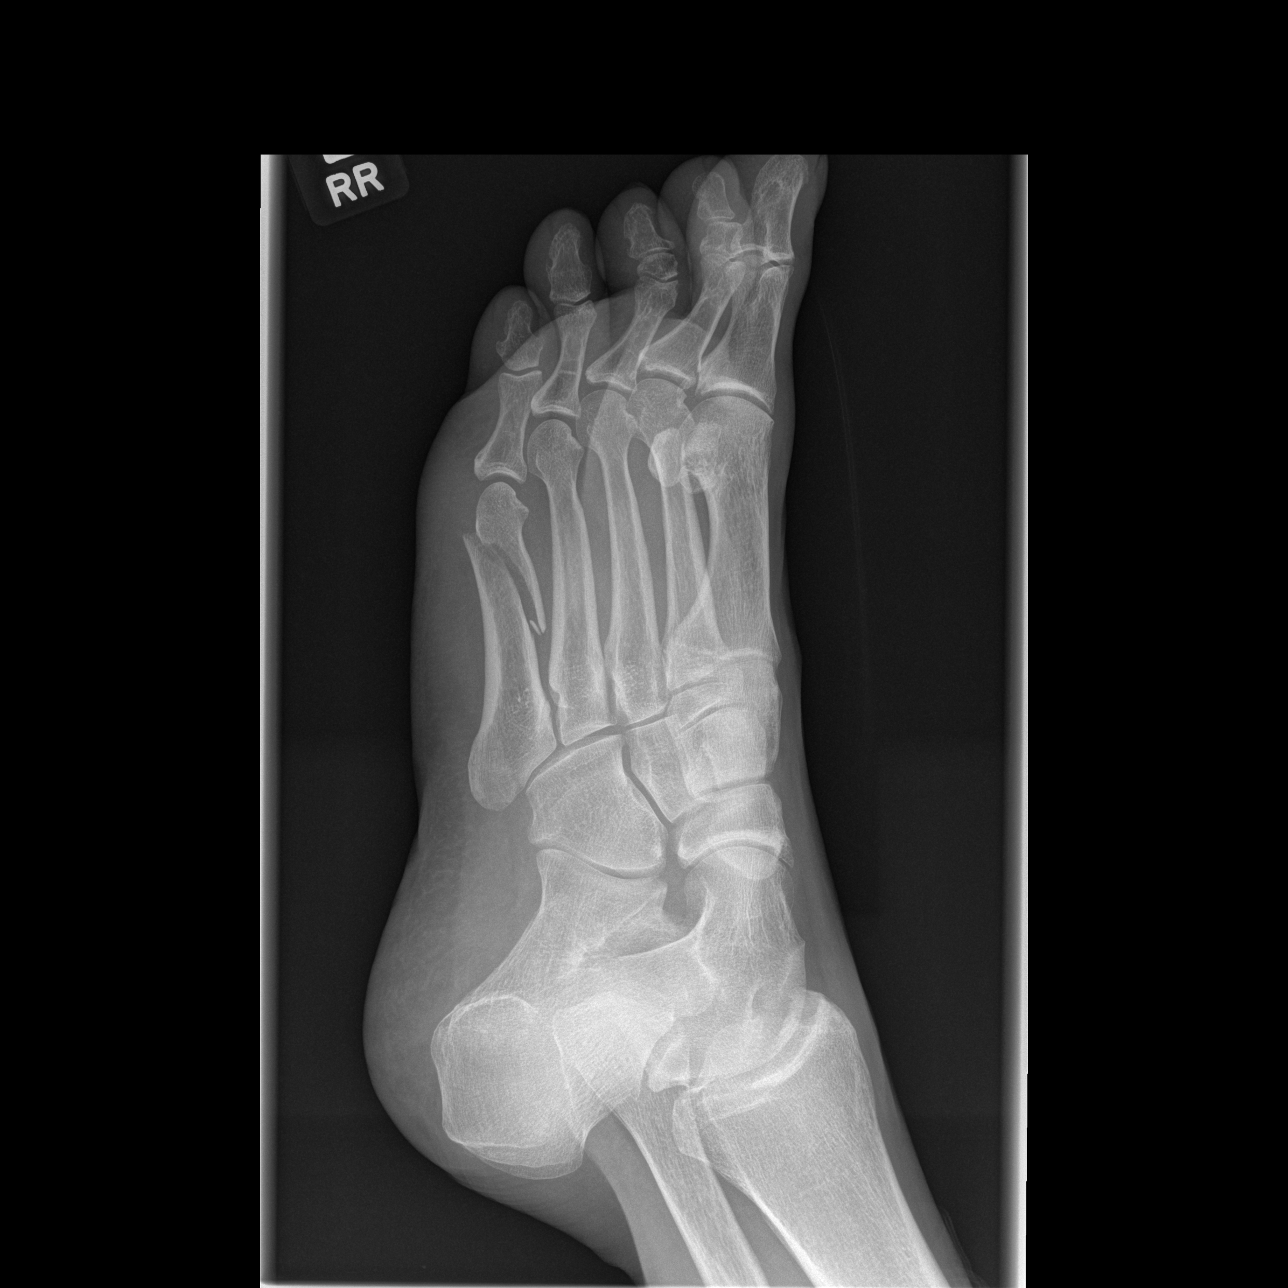

[t foot lat left]
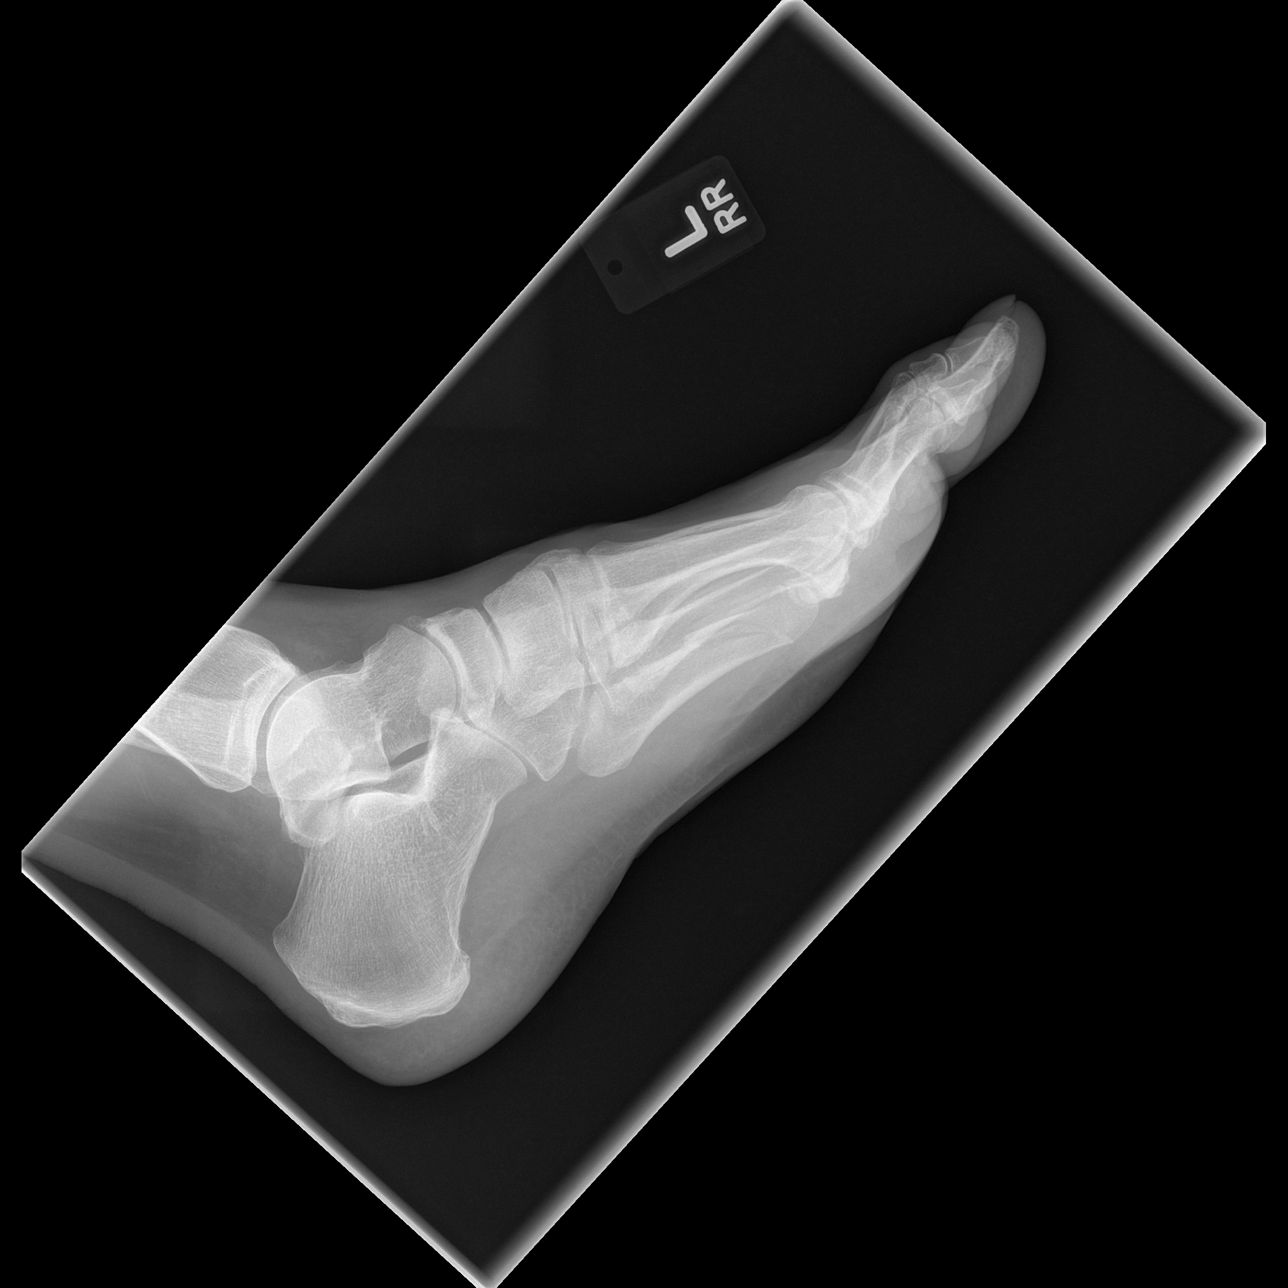

[3 of 3 positions shown; findings below may reference images not displayed]

FINDINGS: Frontal, oblique, and lateral views were obtained. There is an
obliquely oriented mildly comminuted fracture involving the mid to
distal aspects of the fifth metatarsal. There is medial displacement
of the distal major fracture fragment with respect to the major
proximal fragment. No other fractures are evident. No dislocation.
Joint spaces appear unremarkable. No erosive change.
IMPRESSION: Fracture mid to distal aspect of the fifth metatarsal with
displacement of major fracture fragments. No other fracture. No
dislocation. Joint spaces appear unremarkable.

## 2022-10-21 ENCOUNTER — Telehealth (HOSPITAL_COMMUNITY): Payer: Self-pay

## 2022-10-21 NOTE — Telephone Encounter (Signed)
Medication management - Telephone call with pt, after he left a message regarding later refills for his currently prescribed medications. Pt reported having all medications at this time and agreed he would need to schedule a new appt for later this month with Dr. Leone Haven as she wanted to see him back in 4-6 weeks after last visit 09/13/22.  Request given to patient access team to call to schedule.

## 2022-11-08 ENCOUNTER — Telehealth (HOSPITAL_COMMUNITY): Payer: No Payment, Other | Admitting: Psychiatry

## 2022-11-28 ENCOUNTER — Other Ambulatory Visit (HOSPITAL_COMMUNITY): Payer: Self-pay | Admitting: Physician Assistant

## 2022-11-28 DIAGNOSIS — F319 Bipolar disorder, unspecified: Secondary | ICD-10-CM

## 2022-12-13 ENCOUNTER — Telehealth (HOSPITAL_COMMUNITY): Payer: No Payment, Other | Admitting: Psychiatry

## 2022-12-13 ENCOUNTER — Telehealth (HOSPITAL_COMMUNITY): Payer: Self-pay | Admitting: *Deleted

## 2022-12-13 DIAGNOSIS — F3175 Bipolar disorder, in partial remission, most recent episode depressed: Secondary | ICD-10-CM

## 2022-12-13 MED ORDER — MIRTAZAPINE 7.5 MG PO TABS: 7.5000 mg | ORAL_TABLET | Freq: Every day | ORAL | 0 refills | Status: DC

## 2022-12-13 NOTE — Telephone Encounter (Signed)
Verifying patient 4:30 pm

## 2022-12-13 NOTE — Progress Notes (Signed)
Logged in to caregility app for video appointment but patient did not show up.  Called patient at the phone number provided and he states that he does not know how to login for video appointments.  Discussed that we are not doing phone appointments anymore and he would have to login for virtually by video or come in person. Patient is requesting callback to make follow-up in person appointment. Patient is also requesting refill for Remeron.  -Informed front desk to call patient to make appointment. -30-day refill for Remeron 7.5 mg nightly sent to patient's pharmacy.   [name], MD PGY3 Psychiatry Resident  [facility]

## 2022-12-17 ENCOUNTER — Encounter (HOSPITAL_COMMUNITY): Payer: Self-pay | Admitting: Psychiatry

## 2023-01-02 ENCOUNTER — Telehealth (HOSPITAL_COMMUNITY): Payer: No Payment, Other | Admitting: Psychiatry

## 2023-01-02 ENCOUNTER — Encounter (HOSPITAL_COMMUNITY): Payer: Self-pay

## 2023-01-02 ENCOUNTER — Telehealth (HOSPITAL_COMMUNITY): Payer: Self-pay | Admitting: *Deleted

## 2023-01-02 MED ORDER — MIRTAZAPINE 7.5 MG PO TABS: 7.5000 mg | ORAL_TABLET | Freq: Every day | ORAL | 0 refills | Status: DC

## 2023-01-02 NOTE — Telephone Encounter (Signed)
Patient called left a voicemail stating that he needs to cancel his appointment today and reschedule but would like a refill for a 3 month supply of Remeron.

## 2023-01-09 ENCOUNTER — Telehealth (INDEPENDENT_AMBULATORY_CARE_PROVIDER_SITE_OTHER): Payer: No Payment, Other | Admitting: Psychiatry

## 2023-01-09 DIAGNOSIS — F411 Generalized anxiety disorder: Secondary | ICD-10-CM

## 2023-01-09 DIAGNOSIS — F3175 Bipolar disorder, in partial remission, most recent episode depressed: Secondary | ICD-10-CM | POA: Diagnosis not present

## 2023-01-09 DIAGNOSIS — F319 Bipolar disorder, unspecified: Secondary | ICD-10-CM

## 2023-01-09 MED ORDER — LAMOTRIGINE 100 MG PO TABS: 100.0000 mg | ORAL_TABLET | Freq: Every day | ORAL | 2 refills | Status: DC

## 2023-01-09 MED ORDER — TRAZODONE HCL 50 MG PO TABS: 50.0000 mg | ORAL_TABLET | Freq: Every evening | ORAL | 2 refills | Status: DC | PRN

## 2023-01-09 MED ORDER — GABAPENTIN 400 MG PO CAPS: 400.0000 mg | ORAL_CAPSULE | Freq: Three times a day (TID) | ORAL | 2 refills | Status: DC

## 2023-01-09 MED ORDER — MIRTAZAPINE 7.5 MG PO TABS: 7.5000 mg | ORAL_TABLET | Freq: Every day | ORAL | 2 refills | Status: DC

## 2023-01-09 NOTE — Progress Notes (Addendum)
BH MD/PA/NP OP Progress Note  Virtual Visit via Video Note  I connected with George Brady on 01/09/23 at  3:30 PM EST by a video enabled telemedicine application and verified that I am speaking with the correct person using two identifiers.  Location: Patient: [name] Provider: At Clinic   I discussed the limitations of evaluation and management by telemedicine and the availability of in person appointments. The patient expressed understanding and agreed to proceed.   I discussed the assessment and treatment plan with the patient. The patient was provided an opportunity to ask questions and all were answered. The patient agreed with the plan and demonstrated an understanding of the instructions.   The patient was advised to call back or seek an in-person evaluation if the symptoms worsen or if the condition fails to improve as anticipated.  I provided 20 minutes of non-face-to-face time during this encounter.   [name], MD    01/09/2023 3:50 PM CEDRICK AUBRY  MRN:  [id number]  Chief Complaint:  Medication management  HPI:   George Brady is a 57 year old male with a past psychiatric history significant for bipolar disorder and anxiety who presents to [facility] via virtual video visit for follow-up and medication management.  Patient is currently being managed on the following medications:  Trazodone 50 mg at bedtime Lamotrigine 100 mg daily Gabapentin 400 mg 3 times daily Remeron 7.5 mg nightly. Cariprazine 1.5 mg daily (has not been taking it due to insurance issues)  He reports that he has been doing much better after starting Remeron.  He reports that his mood has been stable and reports improvement in his mood and anxiety.  He reports that he has been sleeping and eating much better and gained about 5-10 pounds of weight.  He is reporting improvement in his sex drive.  He is happy that we switched medication to  Remeron.  He has not been taking his Vraylar as he was not able to afford it.  He states that he does not need Vryalar.  He got married in July and denies any relationship issues with his wife.  He has been taking all his medications regularly without any side effects.  He denies the need of changing medication or medication dosages at this time.  He denies using any illicit drugs and occasionally drinks beer.  He works in [employer] and lives with his wife.  He is asking for refills for medications.    Patient is alert and oriented x4, calm, cooperative, and fully engaged in conversation during the encounter.  Patient denies suicidal or homicidal ideations.  He denies auditory or visual hallucinations.  Visit Diagnosis:    ICD-10-CM   1. Bipolar disorder, in partial remission, most recent episode depressed (HCC)  F31.75 mirtazapine (REMERON) 7.5 MG tablet    lamoTRIgine (LAMICTAL) 100 MG tablet    2. Anxiety state  F41.1 gabapentin (NEURONTIN) 400 MG capsule    3. Bipolar I disorder ([place])  F31.9 traZODone (DESYREL) 50 MG tablet      Past Psychiatric History:  Anxiety State Bipolar Disorder I Insomnia  Past Medical History:  Past Medical History:  Diagnosis Date   Hepatitis C    Manic depressive disorder ([place])     Past Surgical History:  Procedure Laterality Date   ABDOMINAL SURGERY      Family Psychiatric History:  Patient does not know if there are psychiatric illnesses on his mom's side  Family  History: No family history on file.  Social History:  Social History   Socioeconomic History   Marital status: Married    Spouse name: Not on file   Number of children: Not on file   Years of education: Not on file   Highest education level: Not on file  Occupational History   Not on file  Tobacco Use   Smoking status: Every Day    Packs/day: 0.50    Types: Cigarettes   Smokeless tobacco: Never  Substance and Sexual Activity   Alcohol use: Yes    Comment: occasional    Drug use: Never   Sexual activity: Not on file  Other Topics Concern   Not on file  Social History Narrative   Not on file   Social Determinants of Health   Financial Resource Strain: Not on file  Food Insecurity: Not on file  Transportation Needs: Not on file  Physical Activity: Not on file  Stress: Not on file  Social Connections: Not on file    Allergies: Not on File  Metabolic Disorder Labs: No results found for: "HGBA1C", "MPG" No results found for: "PROLACTIN" No results found for: "CHOL", "TRIG", "HDL", "CHOLHDL", "VLDL", "LDLCALC" No results found for: "TSH"  Therapeutic Level Labs: No results found for: "LITHIUM" No results found for: "VALPROATE" No results found for: "CBMZ"  Current Medications: Current Outpatient Medications  Medication Sig Dispense Refill   cariprazine (VRAYLAR) 1.5 MG capsule Take 1 capsule (1.5 mg total) by mouth daily. 30 capsule 2   gabapentin (NEURONTIN) 400 MG capsule Take 1 capsule (400 mg total) by mouth 3 (three) times daily. 90 capsule 2   lamoTRIgine (LAMICTAL) 100 MG tablet Take 1 tablet (100 mg total) by mouth daily. 30 tablet 2   mirtazapine (REMERON) 7.5 MG tablet Take 1 tablet (7.5 mg total) by mouth at bedtime. 30 tablet 2   traZODone (DESYREL) 50 MG tablet Take 1 tablet (50 mg total) by mouth at bedtime as needed for sleep. 30 tablet 2   No current facility-administered medications for this visit.     Musculoskeletal: Strength & Muscle Tone: Unable to assess due to telemedicine visit [place]: Unable to assess due to telemedicine visit Patient leans: Unable to assess due to telemedicine visit  Psychiatric Specialty Exam: Review of Systems  Psychiatric/Behavioral:  Negative for decreased concentration, dysphoric mood, hallucinations, self-injury, sleep disturbance and suicidal ideas. The patient is not nervous/anxious and is not hyperactive.     There were no vitals taken for this visit.There is no height or  weight on file to calculate BMI.  General Appearance: Fairly groomed  Eye Contact: Fair  Speech:  Clear and Coherent and Normal Rate  Volume:  Normal  Mood:  Euthymic  Affect:  Appropriate and Full Range  Thought Process:  Coherent and Descriptions of Associations: Intact  Orientation:  Full (Time, Place, and Person)  Thought Content: WDL   Suicidal Thoughts:  No  Homicidal Thoughts:  No  Memory:  Immediate;   Good Recent;   Good Remote;   Good  Judgement:  Good  Insight:  Good  Psychomotor Activity: Normal  Concentration:  Concentration: Good and Attention Span: Good  Recall:  Good  Fund of Knowledge: Good  Language: Good  Akathisia: None  Handed:  Right  AIMS (if indicated): Not done  Assets:  Communication Skills Desire for Improvement Housing Social Support Vocational/Educational  ADL's:  Intact  Cognition: WNL  Sleep:  Good   Screenings: GAD-7  Flowsheet Row Video Visit from 02/20/2022 in [facility] Video Visit from 11/21/2021 in [facility] Office Visit from 04/03/2021 in [facility] Office Visit from 01/31/2021 in [facility]  Total GAD-7 Score '6 2 2 3      '$ PHQ2-9    Flowsheet Row Video Visit from 02/20/2022 in [facility] Video Visit from 11/21/2021 in [facility] Video Visit from 07/03/2021 in [facility] Office Visit from 04/03/2021 in [facility] Office Visit from 01/31/2021 in [facility]  PHQ-2 Total Score 3 3 0 1 0  PHQ-9 Total Score 11 10 -- -- --      Flowsheet Row Video Visit from 02/20/2022 in [facility] Video Visit from 11/21/2021 in [facility] Video Visit from 07/03/2021 in [place] Low Risk  Low Risk Low Risk        Assessment and Plan: Enoch Chevalier is a 57 year old male with a past psychiatric history significant for bipolar disorder and anxiety who presents to [facility] via virtual video visit for follow-up and medication management.    Patient reports improvement in his mood, anxiety, sleep, appetite and sex drive after starting Remeron.  Denies any side effects.  He has not been taking Vraylar as he was not able to afford it but thinks that he does not need it.  Reports stable mood denies SI, HI, AVH.  Patient agrees with the plan patient's medications to be e-prescribed to pharmacy of choice.  Collaboration of Care: Collaboration of Care: Dr. Nehemiah Settle Patient/Guardian was advised Release of Information must be obtained prior to any record release in order to collaborate their care with an outside provider. Patient/Guardian was advised if they have not already done so to contact the registration department to sign all necessary forms in order for [place] to release information regarding their care.   Consent: Patient/Guardian gives verbal consent for treatment and assignment of benefits for services provided during this visit. Patient/Guardian expressed understanding and agreed to proceed.   1. Bipolar disorder, in partial remission, most recent episode depressed (HCC)  - traZODone (DESYREL) 50 MG tablet; Take 1 tablet (50 mg total) by mouth at bedtime.  Dispense: 30 tablet; Refill: 2 - lamoTRIgine (LAMICTAL) 100 MG tablet; Take 1 tablet (100 mg total) by mouth daily.  Dispense: 30 tablet; Refill: 2 .-Continue mirtazapine 7.5 mg nightly .  30-day prescription with 1 refill sent to patient's pharmacy. -Stop cariprazine (VRAYLAR) due to noncompliance.  2. Anxiety state  - gabapentin (NEURONTIN) 400 MG capsule; Take 1 capsule (400 mg total) by mouth 3 (three) times daily.  Dispense: 90 capsule; Refill: 2 Patient to follow up in 8  weeks Provider spent a total of 20 minutes with the patient/reviewing patient's chart  [name], MD PGY3 01/09/2023, 3:50 PM

## 2023-01-14 ENCOUNTER — Encounter (HOSPITAL_COMMUNITY): Payer: Self-pay | Admitting: Psychiatry

## 2023-05-01 ENCOUNTER — Encounter (HOSPITAL_COMMUNITY): Payer: Self-pay | Admitting: Psychiatry

## 2023-05-01 ENCOUNTER — Telehealth (INDEPENDENT_AMBULATORY_CARE_PROVIDER_SITE_OTHER): Payer: No Payment, Other | Admitting: Psychiatry

## 2023-05-01 DIAGNOSIS — F3175 Bipolar disorder, in partial remission, most recent episode depressed: Secondary | ICD-10-CM | POA: Diagnosis not present

## 2023-05-01 DIAGNOSIS — F411 Generalized anxiety disorder: Secondary | ICD-10-CM | POA: Diagnosis not present

## 2023-05-01 DIAGNOSIS — F319 Bipolar disorder, unspecified: Secondary | ICD-10-CM | POA: Diagnosis not present

## 2023-05-01 MED ORDER — GABAPENTIN 400 MG PO CAPS: 400.0000 mg | ORAL_CAPSULE | Freq: Three times a day (TID) | ORAL | 1 refills | Status: DC

## 2023-05-01 MED ORDER — MIRTAZAPINE 15 MG PO TABS: 15.0000 mg | ORAL_TABLET | Freq: Every day | ORAL | 1 refills | Status: DC

## 2023-05-01 MED ORDER — LAMOTRIGINE 100 MG PO TABS: 100.0000 mg | ORAL_TABLET | Freq: Every day | ORAL | 1 refills | Status: DC

## 2023-05-01 MED ORDER — TRAZODONE HCL 50 MG PO TABS: 50.0000 mg | ORAL_TABLET | Freq: Every evening | ORAL | 1 refills | Status: DC | PRN

## 2023-05-01 NOTE — Progress Notes (Signed)
BH MD/PA/NP OP Progress Note  Virtual Visit via Video Note  I connected with George Brady on 05/01/23 at  3:30 PM EDT by a video enabled telemedicine application and verified that I am speaking with the correct person using two identifiers.  Location: Patient: Car in [place] Provider: At Clinic   I discussed the limitations of evaluation and management by telemedicine and the availability of in person appointments. The patient expressed understanding and agreed to proceed.   I discussed the assessment and treatment plan with the patient. The patient was provided an opportunity to ask questions and all were answered. The patient agreed with the plan and demonstrated an understanding of the instructions.   The patient was advised to call back or seek an in-person evaluation if the symptoms worsen or if the condition fails to improve as anticipated.  I provided 20 minutes of non-face-to-face time during this encounter.   Karsten Ro, MD    05/01/2023 3:52 PM George Brady  MRN:  [id number]  Chief Complaint:  Medication management  HPI:   George Brady is a 57 y.o.  male with a past psychiatric history significant for bipolar disorder and anxiety who presents to [facility] via virtual video visit for follow-up and medication management.  Patient is currently being managed on the following medications:  Trazodone 50 mg at bedtime Lamotrigine 100 mg daily Gabapentin 400 mg 3 times daily Remeron 7.5 mg nightly.  He reports that he has been doing well on current medications.  He reports that his mood has been stable and reports improvement in his mood and anxiety.  He reports that he has been sleeping well but his appetite is still the same and he eats only 1-2 times in a day.  He denies any relationship issues with his wife.  He has been taking all his medications regularly without any side effects.  Denies any rash.  He is in some  distress as due to inflation he is always worried about paying his bills.  His ex girlfriend who lives in [place] suffered stroke last year and lost 40% of her vision.  He goes there sometimes to help her.   He is amenable to increasing Remeron dose to help with appetite.  He denies using any illicit drugs and occasionally drinks beer.  He works in [employer] and lives with his wife.  He is asking for refills for medications.    Patient is alert and oriented x4, calm, cooperative, and fully engaged in conversation during the encounter.  Patient denies suicidal or homicidal ideations.  He denies auditory or visual hallucinations.  Visit Diagnosis:    ICD-10-CM   1. Bipolar disorder, in partial remission, most recent episode depressed (HCC)  F31.75 mirtazapine (REMERON) 15 MG tablet    lamoTRIgine (LAMICTAL) 100 MG tablet    2. Bipolar I disorder (HCC)  F31.9 traZODone (DESYREL) 50 MG tablet    3. Anxiety state  F41.1 gabapentin (NEURONTIN) 400 MG capsule      Past Psychiatric History:  Anxiety State Bipolar Disorder I Insomnia  Past Medical History:  Past Medical History:  Diagnosis Date   Hepatitis C    Manic depressive disorder (HCC)     Past Surgical History:  Procedure Laterality Date   ABDOMINAL SURGERY      Family Psychiatric History:  Patient does not know if there are psychiatric illnesses on his mom's side  Family History: No family history on file.  Social  History:  Social History   Socioeconomic History   Marital status: Married    Spouse name: Not on file   Number of children: Not on file   Years of education: Not on file   Highest education level: Not on file  Occupational History   Not on file  Tobacco Use   Smoking status: Every Day    Packs/day: .5    Types: Cigarettes   Smokeless tobacco: Never  Substance and Sexual Activity   Alcohol use: Yes    Comment: occasional   Drug use: Never   Sexual activity: Not on file  Other Topics Concern    Not on file  Social History Narrative   Not on file   Social Determinants of Health   Financial Resource Strain: Not on file  Food Insecurity: Not on file  Transportation Needs: Not on file  Physical Activity: Not on file  Stress: Not on file  Social Connections: Not on file    Allergies: Not on File  Metabolic Disorder Labs: No results found for: "HGBA1C", "MPG" No results found for: "PROLACTIN" No results found for: "CHOL", "TRIG", "HDL", "CHOLHDL", "VLDL", "LDLCALC" No results found for: "TSH"  Therapeutic Level Labs: No results found for: "LITHIUM" No results found for: "VALPROATE" No results found for: "CBMZ"  Current Medications: Current Outpatient Medications  Medication Sig Dispense Refill   gabapentin (NEURONTIN) 400 MG capsule Take 1 capsule (400 mg total) by mouth 3 (three) times daily. 90 capsule 1   lamoTRIgine (LAMICTAL) 100 MG tablet Take 1 tablet (100 mg total) by mouth daily. 30 tablet 1   mirtazapine (REMERON) 15 MG tablet Take 1 tablet (15 mg total) by mouth at bedtime. 30 tablet 1   traZODone (DESYREL) 50 MG tablet Take 1 tablet (50 mg total) by mouth at bedtime as needed for sleep. 30 tablet 1   No current facility-administered medications for this visit.     Musculoskeletal: Strength & Muscle Tone: Unable to assess due to telemedicine visit Gait & Station: Unable to assess due to telemedicine visit Patient leans: Unable to assess due to telemedicine visit  Psychiatric Specialty Exam: Review of Systems  Psychiatric/Behavioral:  Negative for decreased concentration, dysphoric mood, hallucinations, self-injury, sleep disturbance and suicidal ideas. The patient is not nervous/anxious and is not hyperactive.     There were no vitals taken for this visit.There is no height or weight on file to calculate BMI.  General Appearance: Fairly groomed  Eye Contact: Fair  Speech:  Clear and Coherent and Normal Rate  Volume:  Normal  Mood:  Euthymic   Affect:  Appropriate and Full Range  Thought Process:  Coherent and Descriptions of Associations: Intact  Orientation:  Full (Time, Place, and Person)  Thought Content: WDL   Suicidal Thoughts:  No  Homicidal Thoughts:  No  Memory:  Immediate;   Good Recent;   Good Remote;   Good  Judgement:  Good  Insight:  Good  Psychomotor Activity: Normal  Concentration:  Concentration: Good and Attention Span: Good  Recall:  Good  Fund of Knowledge: Good  Language: Good  Akathisia: None  Handed:  Right  AIMS (if indicated): Not done  Assets:  Communication Skills Desire for Improvement Housing Social Support Vocational/Educational  ADL's:  Intact  Cognition: WNL  Sleep:  Good   Screenings: GAD-7    Flowsheet Row [facility] Video Visit from 11/21/2021 in [facility] Office Visit from 04/03/2021  in [place] Office Visit from 01/31/2021 in [facility]  Total GAD-7 Score 6 2 2 3       PHQ2-9    Flowsheet Row Video Visit from 02/20/2022 in [facility] Video Visit from 11/21/2021 in [facility] Video Visit from 07/03/2021 in [facility] Office Visit from 04/03/2021 in [facility] Office Visit from 01/31/2021 in [facility]  PHQ-2 Total Score 3 3 0 1 0  PHQ-9 Total Score 11 10 -- -- --      Flowsheet Row Video Visit from 02/20/2022 in [facility] Video Visit from 11/21/2021 in [facility] Video Visit from 07/03/2021 in [facility]  C-SSRS RISK CATEGORY Low Risk Low Risk Low Risk        Assessment and Plan: Gavinn Collard is a 57 y.o. male with a past psychiatric history significant for bipolar disorder and anxiety who  presents to [facility] via virtual video visit for follow-up and medication management.    Patient reports stable mood and good sleep with Remeron but still reporting poor appetite.  Denies any side effects.  He is amenable to increasing Remeron dose to help with his appetite.  He is not reporting any SI, HI, AVH.  Collaboration of Care: Collaboration of Care: Dr. Adrian Blackwater Patient/Guardian was advised Release of Information must be obtained prior to any record release in order to collaborate their care with an outside provider. Patient/Guardian was advised if they have not already done so to contact the registration department to sign all necessary forms in order for [place] to release information regarding their care.   Consent: Patient/Guardian gives verbal consent for treatment and assignment of benefits for services provided during this visit. Patient/Guardian expressed understanding and agreed to proceed.   1. Bipolar disorder, in partial remission, most recent episode depressed (HCC)  -Continue traZODone (DESYREL) 50 MG tablet; Take 1 tablet (50 mg total) by mouth at bedtime.  Dispense: 30 tablet; Refill: 1 -Continue lamoTRIgine (LAMICTAL) 100 MG tablet; Take 1 tablet (100 mg total) by mouth daily.  Dispense: 30 tablet; Refill: 1 .-Increase mirtazapine to 15 mg nightly. 30-day prescription with 1 refill sent to patient's pharmacy.   2. Anxiety state  -Continue gabapentin (NEURONTIN) 400 MG capsule; Take 1 capsule (400 mg total) by mouth 3 (three) times daily.  Dispense: 90 capsule; Refill: 1 Patient to follow up in 8 weeks To Provider spent a total of 20 minutes with the patient/reviewing patient's chart  Karsten Ro, MD PGY3 05/01/2023, 3:52 PM

## 2023-06-04 ENCOUNTER — Encounter (HOSPITAL_COMMUNITY): Payer: Self-pay

## 2023-06-04 DIAGNOSIS — R294 Clicking hip: Secondary | ICD-10-CM

## 2023-06-13 DIAGNOSIS — R294 Clicking hip: Secondary | ICD-10-CM | POA: Insufficient documentation

## 2023-06-18 DIAGNOSIS — Z719 Counseling, unspecified: Secondary | ICD-10-CM

## 2023-06-18 DIAGNOSIS — Z23 Encounter for immunization: Secondary | ICD-10-CM

## 2023-06-18 NOTE — Progress Notes (Signed)
In nurse clinic brought by parents.  Requesting vaccines needed for school entrance.  Vaccine record copied form [facility] in [place].  See immunization flowsheet.   VISs given.  Administered DTaP, polio, MMR, Varicella, and Hepatitis A; tolerated well.  [facility] updated and 2 copies given.  Cherlynn Polo, RN

## 2023-06-19 DIAGNOSIS — S63256A Unspecified dislocation of right little finger, initial encounter: Secondary | ICD-10-CM | POA: Insufficient documentation

## 2023-06-19 DIAGNOSIS — W010XXA Fall on same level from slipping, tripping and stumbling without subsequent striking against object, initial encounter: Secondary | ICD-10-CM | POA: Insufficient documentation

## 2023-06-19 NOTE — ED Triage Notes (Signed)
Patient ambulatory to triage with steady gait, without difficulty, slurred speech noted; pt reports that he "got tangled up" and fell injuring rt hand; deformity noted to 5th digit

## 2023-06-20 DIAGNOSIS — S63259A Unspecified dislocation of unspecified finger, initial encounter: Secondary | ICD-10-CM

## 2023-06-20 NOTE — ED Provider Notes (Addendum)
[facility] Provider Note    Event Date/Time   First MD Initiated Contact with Patient 06/20/23 [phone]     (approximate)   History   Hand Injury   HPI  Scott Pennington is a 57 y.o. male with no significant past medical history reports stumbling and falling onto a grill outside.  His hand got tangled up in the handle of the grill, causing it to fall over.  Afterward, he reports his right small finger was at a 90 degree angle to the rest of the hand.  He manipulated it and was able to improve it but was not able to restore normal function in the finger.  He complains of severe pain in the small finger.  No other injuries.  He has been drinking alcohol tonight     Physical Exam   Triage Vital Signs: ED Triage Vitals  Encounter Vitals Group     BP 06/20/23 0027 129/69     Systolic BP Percentile --      Diastolic BP Percentile --      Pulse Rate 06/20/23 0027 78     Resp 06/20/23 0027 17     Temp 06/20/23 0027 98.3 F (36.8 C)     Temp Source 06/20/23 0027 Oral     SpO2 06/20/23 0027 95 %     Weight 06/19/23 2355 165 lb (74.8 kg)     Height 06/19/23 2355 5\' 10"  (1.778 m)     Head Circumference --      Peak Flow --      Pain Score 06/19/23 2355 8     Pain Loc --      Pain Education --      Exclude from Growth Chart --     Most recent vital signs: Vitals:   06/20/23 0027  BP: 129/69  Pulse: 78  Resp: 17  Temp: 98.3 F (36.8 C)  SpO2: 95%    General: Awake, no distress.  Alcohol on breath CV:  Good peripheral perfusion.  Normal distal pulses Resp:  Normal effort.  Abd:  No distention.  Other:  Right fifth finger with palpable dislocation of the PIP joint.  No bony point tenderness.  No other tenderness, no lacerations.  Head atraumatic.   ED Results / Procedures / Treatments   Labs (all labs ordered are listed, but only abnormal results are displayed) Labs Reviewed - No data to display   RADIOLOGY X-ray right hand interpreted by  me, shows dorsal dislocation of the right small finger PIP joint.  No acute fracture.  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  Radiology report reviewed   PROCEDURES:  .Ortho Injury Treatment  Date/Time: 06/20/2023 3:14 AM  Performed by: Sharman Cheek, MD Authorized by: Sharman Cheek, MD   Consent:    Consent obtained:  Verbal   Consent given by:  Patient   Risks discussed:  Fracture, irreducible dislocation, stiffness and restricted joint movement   Alternatives discussed:  No treatmentInjury location: finger Location details: right little finger Injury type: dislocation Dislocation type: PIP Pre-procedure neurovascular assessment: neurovascularly intact Pre-procedure distal perfusion: normal Pre-procedure neurological function: normal Pre-procedure range of motion: reduced  Anesthesia: Local anesthesia used: no  Patient sedated: NoManipulation performed: yes Reduction successful: yes Immobilization: splint Splint type: static finger Splint Applied by: ED Nurse Supplies used: aluminum splint Post-procedure neurovascular assessment: post-procedure neurovascularly intact Post-procedure distal perfusion: normal Post-procedure neurological function: normal Post-procedure range of motion: improved  MEDICATIONS ORDERED IN ED: Medications - No data to display   IMPRESSION / MDM / ASSESSMENT AND PLAN / ED COURSE  I reviewed the triage vital signs and the nursing notes.                              Differential diagnosis includes, but is not limited to, finger dislocation, hand fracture.  Doubt infection  Patient presents with deformity of the right small finger after blunt trauma.  X-ray shows dislocation.  This was manipulated and successfully reduced.  Afterward he has intact flexion and extension, doubt tendon rupture.  Will splint and refer to orthopedic follow-up.  Stable for discharge.  He has family member here with him to drive him  home.     FINAL CLINICAL IMPRESSION(S) / ED DIAGNOSES   Final diagnoses:  Closed dislocation of finger of right hand, initial encounter     Rx / DC Orders   ED Discharge Orders     None        Note:  This document was prepared using Dragon voice recognition software and may include unintentional dictation errors.   Sharman Cheek, MD 06/20/23 Robynn Pane    Sharman Cheek, MD 06/20/23 [phone]

## 2023-07-04 ENCOUNTER — Encounter (HOSPITAL_COMMUNITY): Payer: No Payment, Other | Admitting: Student

## 2023-07-09 ENCOUNTER — Ambulatory Visit (INDEPENDENT_AMBULATORY_CARE_PROVIDER_SITE_OTHER): Payer: No Payment, Other | Admitting: Student

## 2023-07-09 DIAGNOSIS — F3175 Bipolar disorder, in partial remission, most recent episode depressed: Secondary | ICD-10-CM

## 2023-07-09 DIAGNOSIS — F411 Generalized anxiety disorder: Secondary | ICD-10-CM

## 2023-07-09 DIAGNOSIS — F4322 Adjustment disorder with anxiety: Secondary | ICD-10-CM | POA: Diagnosis not present

## 2023-07-09 DIAGNOSIS — F319 Bipolar disorder, unspecified: Secondary | ICD-10-CM

## 2023-07-09 MED ORDER — LAMOTRIGINE 100 MG PO TABS: 100.0000 mg | ORAL_TABLET | Freq: Every day | ORAL | 2 refills | Status: DC

## 2023-07-09 MED ORDER — MIRTAZAPINE 15 MG PO TABS: 15.0000 mg | ORAL_TABLET | Freq: Every day | ORAL | 2 refills | Status: DC

## 2023-07-09 MED ORDER — GABAPENTIN 400 MG PO CAPS
400.0000 mg | ORAL_CAPSULE | Freq: Three times a day (TID) | ORAL | 2 refills | Status: DC
Start: 2023-07-09 — End: 2023-09-11

## 2023-07-09 MED ORDER — TRAZODONE HCL 50 MG PO TABS: 50.0000 mg | ORAL_TABLET | Freq: Every evening | ORAL | 2 refills | Status: DC | PRN

## 2023-07-11 ENCOUNTER — Telehealth (HOSPITAL_COMMUNITY): Payer: Self-pay | Admitting: Student

## 2023-07-11 NOTE — Telephone Encounter (Signed)
I reviewed the case with the attending psychiatrist, and we discussed that overlap between trazodone and Remeron is potentially deleterious to the patient's mental health going forward.  I called the patient to discuss this and to recommend that he discontinue the trazodone.  The patient expressed his understanding and said that he would make this change.  Carlyn Reichert, MD PGY-3
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
## 2023-07-11 NOTE — Addendum Note (Signed)
Addended by: Tia Masker on: 07/11/2023 11:07 AM   Modules accepted: Orders

## 2023-09-10 NOTE — Progress Notes (Signed)
BH MD Outpatient Progress Note  09/14/2023 4:04 PM George Brady  MRN:  [id number]   Televisit via video: I connected with George Brady on 09/11/2023 at 10:00 AM EDT by a video enabled telemedicine application and verified that I am speaking with the correct person using two identifiers.  Location: Patient: Hospital (visiting his wife) Provider: Office   I discussed the limitations of evaluation and management by telemedicine and the availability of in person appointments. The patient expressed understanding and agreed to proceed.  I discussed the assessment and treatment plan with the patient. The patient was provided an opportunity to ask questions and all were answered. The patient agreed with the plan and demonstrated an understanding of the instructions.   The patient was advised to call back or seek an in-person evaluation if the symptoms worsen or if the condition fails to improve as anticipated.  Assessment:  George Brady presents for follow-up evaluation.  At the last visit, the patient was doing fairly well, no medication changes were made.  Today the patient reports significant frustrations regarding social circumstances: He has unpaid bills and may lose his house soon, and his wife is sick, currently in the hospital with a bowel perforation.  The patient is understandably upset, and much of the visit was dedicated to empathic listening.  It does not appear there is any exacerbation of his underlying bipolar disorder.  No medication changes were made at the visit.  I encouraged the patient to come in to receive therapy; the patient declined.  We scheduled a relatively quick follow-up visit so that we can continue to help the patient.   Identifying Information: George Brady is a 57 y.o. y.o. male with a history of bipolar affective disorder who is an established patient with [facility] for management of depression.  He has been established  with the clinic for several years.  Plan:  # Bipolar affective disorder in partial remission, most recent episode depressed Interventions: - Continue lamotrigine 100 mg daily for maintenance treatment of bipolar disorder - Continue Remeron 15 mg nightly for appetite and mood - Continue gabapentin 400 mg twice daily for anxiety (the patient was prescribed this medication 3 times daily, but is only taking it twice daily)  Future treatment plans could include an increase in lamotrigine or addition of a second-generation antipsychotic, should symptoms of the patient's bipolar disorder reemerge.  Patient was given contact information for behavioral health clinic and was instructed to call 911 for emergencies.   Subjective:  Chief Complaint:  Chief Complaint  Patient presents with   Follow-up    Interval History:  The patient states that he is having a difficult time with his wife in the hospital.  He states that she alternatively tells him to go back to work so that he can pay the bills but then demands that he be at her side constantly.  He reports that he has unpaid legal bills that are threatening his financial situation and making it difficult for him to keep up with his house payments.  He feels that he may lose his home.  He states that he is going to take a job at [employer], a change of events that he finds humiliating because he worked for so long doing higher paying jobs in [employer].   The patient reports social support from his pastor, and he clearly finds this person helpful.  He states that he has been praying and that this has  been keeping him hopeful.  Encouragement was given.  The patient denies experiencing hopelessness or suicidal thoughts.  We discussed the behavioral health urgent care and that he can always come in in the event of a crisis.  He expressed his understanding.  I told him that he should reach out should he need further support.  Follow-up appointment  planned for November 12.  Visit Diagnosis:    ICD-10-CM   1. Adjustment disorder with anxious mood  F43.22 gabapentin (NEURONTIN) 400 MG capsule    2. Bipolar disorder, in partial remission, most recent episode depressed (HCC)  F31.75 mirtazapine (REMERON) 15 MG tablet    lamoTRIgine (LAMICTAL) 100 MG tablet    gabapentin (NEURONTIN) 400 MG capsule    3. Psychophysiological insomnia  F51.04       Past Psychiatric History: No history of inpatient psychiatric admissions or self-harm behavior.  The patient does report remote history of severe substance use problems, which included IVDU.  Past Medical History:  Past Medical History:  Diagnosis Date   Hepatitis C    Manic depressive disorder (HCC)     Past Surgical History:  Procedure Laterality Date   ABDOMINAL SURGERY      Family Psychiatric History: None pertinent  Family History: No family history on file.  Social History:  Social History   Socioeconomic History   Marital status: Married    Spouse name: Not on file   Number of children: Not on file   Years of education: Not on file   Highest education level: Not on file  Occupational History   Not on file  Tobacco Use   Smoking status: Every Day    Current packs/day: 0.50    Types: Cigarettes   Smokeless tobacco: Never  Substance and Sexual Activity   Alcohol use: Yes    Comment: occasional   Drug use: Never   Sexual activity: Not on file  Other Topics Concern   Not on file  Social History Narrative   Not on file   Social Determinants of Health   Financial Resource Strain: Not on file  Food Insecurity: Not on file  Transportation Needs: Not on file  Physical Activity: Not on file  Stress: Not on file  Social Connections: Not on file    Allergies: Not on File  Current Medications: Current Outpatient Medications  Medication Sig Dispense Refill   gabapentin (NEURONTIN) 400 MG capsule Take 1 capsule (400 mg total) by mouth 3 (three) times daily. 90  capsule 2   lamoTRIgine (LAMICTAL) 100 MG tablet Take 1 tablet (100 mg total) by mouth daily. 30 tablet 2   mirtazapine (REMERON) 15 MG tablet Take 1 tablet (15 mg total) by mouth at bedtime. 30 tablet 2   No current facility-administered medications for this visit.     Objective:  Psychiatric Specialty Exam: Physical Exam Constitutional:      Appearance: the patient is not toxic-appearing.  Pulmonary:     Effort: Pulmonary effort is normal.  Neurological:     General: No focal deficit present.     Mental Status: the patient is alert and oriented to person, place, and time.   Review of Systems  Respiratory:  Negative for shortness of breath.   Cardiovascular:  Negative for chest pain.  Gastrointestinal:  Negative for abdominal pain, constipation, diarrhea, nausea and vomiting.  Neurological:  Negative for headaches.      There were no vitals taken for this visit.  General Appearance: Fairly Groomed  Eye Contact:  Good  Speech:  Clear and Coherent  Volume:  Normal  Mood: "Angry"  Affect:  Congruent, irritable  Thought Process:  Coherent  Orientation:  Full (Time, Place, and Person)  Thought Content: Logical   Suicidal Thoughts:  No  Homicidal Thoughts:  No  Memory:  Immediate;   Good  Judgement:  fair  Insight:  fair  Psychomotor Activity:  Normal  Concentration:  Concentration: Good  Recall:  Good  Fund of Knowledge: Good  Language: Good  Akathisia:  No  Handed:    AIMS (if indicated): not done  Assets:  Communication Skills Desire for Improvement Financial Resources/Insurance Housing Leisure Time Physical Health  ADL's:  Intact  Cognition: WNL  Sleep:  Fair     Metabolic Disorder Labs: No results found for: "HGBA1C", "MPG" No results found for: "PROLACTIN" No results found for: "CHOL", "TRIG", "HDL", "CHOLHDL", "VLDL", "LDLCALC" No results found for: "TSH"  Therapeutic Level Labs: No results found for: "LITHIUM" No results found for:  "VALPROATE" No results found for: "CBMZ"  Screenings: GAD-7    Flowsheet Row Video Visit from 02/20/2022 in [facility] Video Visit from 11/21/2021 in [facility] Office Visit from 04/03/2021 in [facility] Office Visit from 01/31/2021 in [facility]  Total GAD-7 Score 6 2 2 3       PHQ2-9    Flowsheet Row Video Visit from 02/20/2022 in [facility] Video Visit from 11/21/2021 in [facility] Video Visit from 07/03/2021 in [facility] Office Visit from 04/03/2021 in [facility] Office Visit from 01/31/2021 in [facility]  PHQ-2 Total Score 3 3 0 1 0  PHQ-9 Total Score 11 10 -- -- --      Flowsheet Row Video Visit from 02/20/2022 in [facility] Video Visit from 11/21/2021 in [facility] Video Visit from 07/03/2021 in [facility]  C-SSRS RISK CATEGORY Low Risk Low Risk Low Risk       Collaboration of Care: none  A total of 30 minutes was spent involved in face to face clinical care, chart review, documentation.   Carlyn Reichert, MD 09/14/2023, 4:04 PM

## 2023-09-11 ENCOUNTER — Telehealth (INDEPENDENT_AMBULATORY_CARE_PROVIDER_SITE_OTHER): Payer: No Payment, Other | Admitting: Student

## 2023-09-11 ENCOUNTER — Encounter (HOSPITAL_COMMUNITY): Payer: Self-pay

## 2023-09-11 DIAGNOSIS — F5104 Psychophysiologic insomnia: Secondary | ICD-10-CM

## 2023-09-11 DIAGNOSIS — F3175 Bipolar disorder, in partial remission, most recent episode depressed: Secondary | ICD-10-CM

## 2023-09-11 DIAGNOSIS — F4322 Adjustment disorder with anxiety: Secondary | ICD-10-CM | POA: Diagnosis not present

## 2023-09-11 MED ORDER — GABAPENTIN 400 MG PO CAPS
400.0000 mg | ORAL_CAPSULE | Freq: Three times a day (TID) | ORAL | 2 refills | Status: DC
Start: 2023-09-11 — End: 2023-12-01

## 2023-09-11 MED ORDER — MIRTAZAPINE 15 MG PO TABS
15.0000 mg | ORAL_TABLET | Freq: Every day | ORAL | 2 refills | Status: DC
Start: 2023-09-11 — End: 2023-12-01

## 2023-09-11 MED ORDER — LAMOTRIGINE 100 MG PO TABS
100.0000 mg | ORAL_TABLET | Freq: Every day | ORAL | 2 refills | Status: DC
Start: 2023-09-11 — End: 2023-12-01

## 2023-09-14 DIAGNOSIS — F3175 Bipolar disorder, in partial remission, most recent episode depressed: Secondary | ICD-10-CM | POA: Insufficient documentation

## 2023-09-14 DIAGNOSIS — F5104 Psychophysiologic insomnia: Secondary | ICD-10-CM | POA: Insufficient documentation

## 2023-09-15 NOTE — Addendum Note (Signed)
Addended by: Everlena Cooper on: 09/15/2023 09:07 AM   Modules accepted: Level of Service

## 2023-09-30 ENCOUNTER — Encounter (HOSPITAL_COMMUNITY): Payer: No Payment, Other | Admitting: Student

## 2023-09-30 NOTE — Progress Notes (Deleted)
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

## 2023-12-01 ENCOUNTER — Telehealth (HOSPITAL_COMMUNITY): Payer: Self-pay | Admitting: Student

## 2023-12-01 ENCOUNTER — Other Ambulatory Visit (HOSPITAL_COMMUNITY): Payer: Self-pay | Admitting: Student

## 2023-12-01 DIAGNOSIS — F3175 Bipolar disorder, in partial remission, most recent episode depressed: Secondary | ICD-10-CM

## 2023-12-01 DIAGNOSIS — F4322 Adjustment disorder with anxiety: Secondary | ICD-10-CM

## 2023-12-01 MED ORDER — MIRTAZAPINE 15 MG PO TABS
15.0000 mg | ORAL_TABLET | Freq: Every day | ORAL | 2 refills | Status: DC
Start: 2023-12-01 — End: 2024-01-20

## 2023-12-01 MED ORDER — LAMOTRIGINE 100 MG PO TABS
100.0000 mg | ORAL_TABLET | Freq: Every day | ORAL | 2 refills | Status: DC
Start: 2023-12-01 — End: 2024-01-20

## 2023-12-01 MED ORDER — GABAPENTIN 400 MG PO CAPS
400.0000 mg | ORAL_CAPSULE | Freq: Three times a day (TID) | ORAL | 2 refills | Status: DC
Start: 2023-12-01 — End: 2024-01-20

## 2023-12-11 MED ORDER — ESCITALOPRAM OXALATE 5 MG PO TABS
5.0000 mg | ORAL_TABLET | Freq: Every day | ORAL | 2 refills | Status: AC
Start: 2023-12-11 — End: ?

## 2024-01-20 ENCOUNTER — Ambulatory Visit (INDEPENDENT_AMBULATORY_CARE_PROVIDER_SITE_OTHER): Payer: No Payment, Other | Admitting: Student

## 2024-01-20 ENCOUNTER — Encounter (HOSPITAL_COMMUNITY): Payer: Self-pay | Admitting: Student

## 2024-01-20 ENCOUNTER — Telehealth (HOSPITAL_COMMUNITY): Payer: Self-pay | Admitting: Student

## 2024-01-20 DIAGNOSIS — F5104 Psychophysiologic insomnia: Secondary | ICD-10-CM

## 2024-01-20 DIAGNOSIS — F3175 Bipolar disorder, in partial remission, most recent episode depressed: Secondary | ICD-10-CM | POA: Diagnosis not present

## 2024-01-20 MED ORDER — LURASIDONE HCL 20 MG PO TABS
20.0000 mg | ORAL_TABLET | Freq: Every day | ORAL | 2 refills | Status: DC
Start: 2024-01-20 — End: 2024-03-09

## 2024-01-20 MED ORDER — MIRTAZAPINE 15 MG PO TABS
15.0000 mg | ORAL_TABLET | Freq: Every day | ORAL | 2 refills | Status: DC
Start: 2024-01-20 — End: 2024-03-09

## 2024-01-20 MED ORDER — LAMOTRIGINE 25 MG PO TABS
ORAL_TABLET | ORAL | 0 refills | Status: DC
Start: 2024-01-20 — End: 2024-01-20

## 2024-01-20 MED ORDER — LAMOTRIGINE 25 MG PO TABS: ORAL_TABLET | ORAL | 1 refills | Status: DC

## 2024-01-22 NOTE — Progress Notes (Signed)
 BH MD Outpatient Progress Note  01/22/2024 1:43 PM George Brady  MRN:  [id number]   Assessment:  George Brady presents for follow-up evaluation.  Today the patient reports continued psychosocial stressors, much of which is related to him having difficulty finding employment.  He has not been seen at the clinic for approximately 5 months as a result of a no-show in November.  The patient admits that he has been off of his lamotrigine for the past week.  He is interested in restarting the medication.  We discussed the need for daily adherence and he expressed his understanding.  Plan for retitration to 100 mg daily.  The patient feels positively about his Remeron.  Plan to add Latuda at this visit to help with depressive symptoms.  I will note that the patient has had significant side effects with other second-generation antipsychotics.   Identifying Information: George Brady is a 58 y.o. y.o. male with a history of bipolar affective disorder who is an established patient with [facility] for management of depression.  He has been established with the clinic for several years.  Plan:  # Bipolar affective disorder in partial remission, most recent episode depressed Previous med trials: Abilify ineffective, Zyprexa significant weight gain, Seroquel cognitive slowing Interventions: - Retitrate lamotrigine to 100 mg daily: 25 mg daily for 2 weeks, 50 mg daily for 2 weeks, then 100 mg daily thereafter - Continue Remeron 15 mg nightly for appetite and mood - Discontinue gabapentin, based on perceived side effects - Start Latuda 20 mg daily with food  Future treatment plans could include an increase in lamotrigine or addition of a second-generation antipsychotic, should symptoms of the patient's bipolar disorder reemerge.  Patient was given contact information for behavioral health clinic and was instructed to call 911 for emergencies.   Subjective:  Chief  Complaint:  Chief Complaint  Patient presents with   Follow-up    Interval History:  In addition to the information above, the patient reports poor appetite, which he feels is due to stress.  We discussed the need for him to follow-up with a PCP and he agreed.  Information given.  He reports getting approximately 5 hours of sleep per night.  He reports occasional panic attacks.  He denies experiencing any manic symptoms.  He denies experiencing any suicidal thoughts or homicidal thoughts.  Visit Diagnosis:    ICD-10-CM   1. Bipolar disorder, in partial remission, most recent episode depressed (HCC)  F31.75 mirtazapine (REMERON) 15 MG tablet    lurasidone (LATUDA) 20 MG TABS tablet    lamoTRIgine (LAMICTAL) 25 MG tablet    DISCONTINUED: lamoTRIgine (LAMICTAL) 25 MG tablet      Past Psychiatric History: No history of inpatient psychiatric admissions or self-harm behavior.  The patient does report remote history of severe substance use problems, which included IVDU.  Past Medical History:  Past Medical History:  Diagnosis Date   Hepatitis C    Manic depressive disorder (HCC)     Past Surgical History:  Procedure Laterality Date   ABDOMINAL SURGERY      Family Psychiatric History: None pertinent  Family History: No family history on file.  Social History:  Social History   Socioeconomic History   Marital status: Married    Spouse name: Not on file   Number of children: Not on file   Years of education: Not on file   Highest education level: Not on file  Occupational History   Not on  file  Tobacco Use   Smoking status: Every Day    Current packs/day: 0.50    Types: Cigarettes   Smokeless tobacco: Never  Substance and Sexual Activity   Alcohol use: Yes    Comment: occasional   Drug use: Never   Sexual activity: Not on file  Other Topics Concern   Not on file  Social History Narrative   Not on file   Social Drivers of Health   Financial Resource Strain: Not on  file  Food Insecurity: Not on file  Transportation Needs: Not on file  Physical Activity: Not on file  Stress: Not on file  Social Connections: Not on file    Allergies: Not on File  Current Medications: Current Outpatient Medications  Medication Sig Dispense Refill   lurasidone (LATUDA) 20 MG TABS tablet Take 1 tablet (20 mg total) by mouth daily. 30 tablet 2   lamoTRIgine (LAMICTAL) 25 MG tablet Take 1 tablet (25 mg total) by mouth daily for 14 days, THEN 2 tablets (50 mg total) daily for 14 days, THEN 4 tablets (100 mg total) daily. 120 tablet 1   mirtazapine (REMERON) 15 MG tablet Take 1 tablet (15 mg total) by mouth at bedtime. 30 tablet 2   No current facility-administered medications for this visit.     Objective:  Psychiatric Specialty Exam: Physical Exam Constitutional:      Appearance: the patient is not toxic-appearing.  Pulmonary:     Effort: Pulmonary effort is normal.  Neurological:     General: No focal deficit present.     Mental Status: the patient is alert and oriented to person, place, and time.   Review of Systems  Respiratory:  Negative for shortness of breath.   Cardiovascular:  Negative for chest pain.  Gastrointestinal:  Negative for abdominal pain, constipation, diarrhea, nausea and vomiting.  Neurological:  Negative for headaches.      There were no vitals taken for this visit.  General Appearance: Fairly Groomed  Eye Contact:  Good  Speech:  Clear and Coherent  Volume:  Normal  Mood: "Okay"  Affect:  Congruent, irritable at times  Thought Process:  Coherent  Orientation:  Full (Time, Place, and Person)  Thought Content: Logical   Suicidal Thoughts:  No  Homicidal Thoughts:  No  Memory:  Immediate;   Good  Judgement:  fair  Insight:  fair  Psychomotor Activity:  Normal  Concentration:  Concentration: Good  Recall:  Good  Fund of Knowledge: Good  Language: Good  Akathisia:  No  Handed:    AIMS (if indicated): not done  Assets:   Communication Skills Desire for Improvement Financial Resources/Insurance Housing Leisure Time Physical Health  ADL's:  Intact  Cognition: WNL  Sleep:  Fair     Metabolic Disorder Labs: No results found for: "HGBA1C", "MPG" No results found for: "PROLACTIN" No results found for: "CHOL", "TRIG", "HDL", "CHOLHDL", "VLDL", "LDLCALC" No results found for: "TSH"  Therapeutic Level Labs: No results found for: "LITHIUM" No results found for: "VALPROATE" No results found for: "CBMZ"  Screenings: GAD-7    Flowsheet Row Video Visit from 02/20/2022 in [facility] Video Visit from 11/21/2021 in [facility] Office Visit from 04/03/2021 in [facility] Office Visit from 01/31/2021 in [facility]  Total GAD-7 Score 6 2 2 3       PHQ2-9    Flowsheet Row Video Visit from 02/20/2022 in [facility]  Video Visit from 11/21/2021 in [facility] Video Visit from 07/03/2021 in [facility] Office Visit from 04/03/2021 in [facility] Office Visit from 01/31/2021 in [facility]  PHQ-2 Total Score 3 3 0 1 0  PHQ-9 Total Score 11 10 -- -- --      Flowsheet Row Video Visit from 02/20/2022 in [facility] Video Visit from 11/21/2021 in [facility] Video Visit from 07/03/2021 in [facility]  C-SSRS RISK CATEGORY Low Risk Low Risk Low Risk       Collaboration of Care: none  A total of 30 minutes was spent involved in face to face clinical care, chart review, documentation.   Carlyn Reichert, MD 01/22/2024, 1:43 PM

## 2024-03-09 ENCOUNTER — Ambulatory Visit (INDEPENDENT_AMBULATORY_CARE_PROVIDER_SITE_OTHER): Admitting: Student

## 2024-03-09 VITALS — Wt 145.0 lb

## 2024-03-09 DIAGNOSIS — F1991 Other psychoactive substance use, unspecified, in remission: Secondary | ICD-10-CM | POA: Diagnosis not present

## 2024-03-09 DIAGNOSIS — F3175 Bipolar disorder, in partial remission, most recent episode depressed: Secondary | ICD-10-CM | POA: Diagnosis not present

## 2024-03-09 DIAGNOSIS — Z79899 Other long term (current) drug therapy: Secondary | ICD-10-CM | POA: Diagnosis not present

## 2024-03-09 MED ORDER — MIRTAZAPINE 15 MG PO TABS: 15.0000 mg | ORAL_TABLET | Freq: Every day | ORAL | 2 refills | Status: DC

## 2024-03-09 MED ORDER — LURASIDONE HCL 20 MG PO TABS: 20.0000 mg | ORAL_TABLET | Freq: Every day | ORAL | 2 refills | Status: DC

## 2024-03-09 MED ORDER — LAMOTRIGINE 100 MG PO TABS: 100.0000 mg | ORAL_TABLET | Freq: Every day | ORAL | 2 refills | Status: DC

## 2024-03-09 NOTE — Progress Notes (Addendum)
 BH MD Outpatient Progress Note  03/09/2024 12:50 PM George Brady  MRN:  [id number]   Assessment:  George Brady presents for follow-up evaluation.  The patient reports a significant improvement in his symptoms of depression and anxiety with starting Latuda .  No medication changes planned for today.   Identifying Information: George Brady is a 58 y.o. y.o. male with a history of bipolar affective disorder who is an established patient with [facility] for management of depression.  He has been established with the clinic for several years.  Plan:  # Bipolar affective disorder in partial remission, most recent episode depressed Previous med trials: Abilify ineffective, Zyprexa significant weight gain, Seroquel cognitive slowing Interventions: - Continue lamotrigine  100 mg daily - Continue Remeron  15 mg nightly for appetite and mood - Continue Latuda  20 mg p.o. daily with food - Discussed risk of tardive dyskinesia with the patient 03/09/2024  #Long term use of antipsychotic medication -- Patient plans to get established with the internal medicine center - Need EKG and lab work - AIMS 0 on 03/09/2024  # History of illicit drug use Interventions: - Patient reports sobriety for the past several decades  Patient was given contact information for behavioral health clinic and was instructed to call 911 for emergencies.   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  He is happy about making money and being productive.  Unfortunately, he does report that their financial difficulties have already led to severe consequences.  He reports regular and restful sleep.  He reports improved appetite, eating more.  He denies experiencing any depression or suicidal thoughts.   He denies any symptoms of mania.  Visit Diagnosis:    ICD-10-CM   1. Bipolar disorder, in partial remission, most recent episode depressed (HCC)  F31.75 lamoTRIgine  (LAMICTAL ) 100 MG tablet    lurasidone  (LATUDA ) 20 MG TABS tablet    mirtazapine  (REMERON ) 15 MG tablet    2. History of illicit drug use  F19.91        Past Psychiatric History: No history of inpatient psychiatric admissions or self-harm behavior.  The patient does report remote history of severe substance use problems, which included IVDU.  Past Medical History:  Past Medical History:  Diagnosis Date   Hepatitis C    Manic depressive disorder (HCC)     Past Surgical History:  Procedure Laterality Date   ABDOMINAL SURGERY      Family Psychiatric History: None pertinent  Family History: No family history on file.  Social History:  Social History   Socioeconomic History   Marital status: Married    Spouse name: Not on file   Number of children: Not on file   Years of education: Not on file   Highest education level: Not on file  Occupational History   Not on file  Tobacco Use   Smoking status: Every Day    Current packs/day: 0.50    Types: Cigarettes   Smokeless tobacco: Never  Substance and Sexual Activity   Alcohol use: Yes    Comment: occasional   Drug use: Never   Sexual activity: Not on file  Other Topics Concern   Not on file  Social History Narrative   Not on file   Social Drivers of Health   Financial Resource Strain:  Not on file  Food Insecurity: Not on file  Transportation Needs: Not on file  Physical Activity: Not on file  Stress: Not on file  Social Connections: Not on file    Allergies: Not on File  Current Medications: Current Outpatient Medications  Medication Sig Dispense Refill   lamoTRIgine  (LAMICTAL ) 100 MG tablet Take 1 tablet (100 mg total) by mouth daily. 30 tablet 2   lurasidone  (LATUDA ) 20 MG TABS tablet Take 1 tablet (20 mg total) by mouth daily. 30 tablet 2    mirtazapine  (REMERON ) 15 MG tablet Take 1 tablet (15 mg total) by mouth at bedtime. 30 tablet 2   No current facility-administered medications for this visit.     Objective:  Psychiatric Specialty Exam: Physical Exam Constitutional:      Appearance: the patient is not toxic-appearing.  Pulmonary:     Effort: Pulmonary effort is normal.  Neurological:     General: No focal deficit present.     Mental Status: the patient is alert and oriented to person, place, and time.   Review of Systems  Respiratory:  Negative for shortness of breath.   Cardiovascular:  Negative for chest pain.  Gastrointestinal:  Negative for abdominal pain, constipation, diarrhea, nausea and vomiting.  Neurological:  Negative for headaches.      Wt 145 lb (65.8 kg)   BMI 22.05 kg/m   General Appearance: Fairly Groomed  Eye Contact:  Good  Speech:  Clear and Coherent  Volume:  Normal  Mood: "Okay"  Affect:  Congruent, irritable at times  Thought Process:  Coherent  Orientation:  Full (Time, Place, and Person)  Thought Content: Logical   Suicidal Thoughts:  No  Homicidal Thoughts:  No  Memory:  Immediate;   Good  Judgement:  fair  Insight:  fair  Psychomotor Activity:  Normal  Concentration:  Concentration: Good  Recall:  Good  Fund of Knowledge: Good  Language: Good  Akathisia:  No  Handed:    AIMS (if indicated): not done  Assets:  Communication Skills Desire for Improvement Financial Resources/Insurance Housing Leisure Time Physical Health  ADL's:  Intact  Cognition: WNL  Sleep:  Fair     Metabolic Disorder Labs: No results found for: "HGBA1C", "MPG" No results found for: "PROLACTIN" No results found for: "CHOL", "TRIG", "HDL", "CHOLHDL", "VLDL", "LDLCALC" No results found for: "TSH"  Therapeutic Level Labs: No results found for: "LITHIUM" No results found for: "VALPROATE" No results found for: "CBMZ"  Screenings: GAD-7    Flowsheet Row Video Visit from 02/20/2022 in  [facility] Video Visit from 11/21/2021 in [facility] Office Visit from 04/03/2021 in [facility] Office Visit from 01/31/2021 in [facility]  Total GAD-7 Score 6 2 2 3       PHQ2-9    Flowsheet Row Video Visit from 02/20/2022 in [facility] Video Visit from 11/21/2021 in [facility] Video Visit from 07/03/2021 in [facility] Office Visit from 04/03/2021 in [facility] Office Visit from 01/31/2021 in [facility]  PHQ-2 Total Score 3 3 0 1 0  PHQ-9 Total Score 11 10 -- -- --      Flowsheet Row Video Visit from 02/20/2022 in [facility] Video Visit from 11/21/2021 in [facility] Video Visit from 07/03/2021 in [facility]  C-SSRS RISK CATEGORY  Low Risk Low Risk Low Risk       Collaboration of Care: none  A total of 30 minutes was spent involved in face to face clinical care, chart review, documentation.   Marilou Showman, MD 03/09/2024, 12:50 PM

## 2024-05-11 ENCOUNTER — Telehealth (HOSPITAL_COMMUNITY): Admitting: Student

## 2024-05-11 ENCOUNTER — Telehealth (HOSPITAL_COMMUNITY): Payer: Self-pay

## 2024-05-11 DIAGNOSIS — F3175 Bipolar disorder, in partial remission, most recent episode depressed: Secondary | ICD-10-CM | POA: Diagnosis not present

## 2024-05-11 DIAGNOSIS — Z79899 Other long term (current) drug therapy: Secondary | ICD-10-CM

## 2024-05-11 MED ORDER — LAMOTRIGINE 100 MG PO TABS: 100.0000 mg | ORAL_TABLET | Freq: Every day | ORAL | 2 refills | Status: DC

## 2024-05-11 MED ORDER — LURASIDONE HCL 20 MG PO TABS: 20.0000 mg | ORAL_TABLET | Freq: Every day | ORAL | 2 refills | Status: DC

## 2024-05-11 MED ORDER — MIRTAZAPINE 15 MG PO TABS: 15.0000 mg | ORAL_TABLET | Freq: Every day | ORAL | 2 refills | Status: DC

## 2024-05-11 NOTE — Progress Notes (Unsigned)
 Today: Foreclosure, wife is negative, lost job due to mistake Talks at length  Depr mood No TSH No mania Meds are doing all they can - it's all this stuff   Plan: Meds same Says he will call PCP Asked front desk for call to him for labs and [facility] in early Aug - 7/2 and 8/12 Cannot do therapy with Juliene d/t transportation

## 2024-05-11 NOTE — Telephone Encounter (Signed)
 Error encounter.

## 2024-05-13 NOTE — Addendum Note (Signed)
 Addended by: MERCY DOMINO A on: 05/13/2024 03:45 PM   Modules accepted: Level of Service

## 2024-05-19 ENCOUNTER — Other Ambulatory Visit (HOSPITAL_COMMUNITY): Payer: Self-pay | Admitting: Student

## 2024-05-19 ENCOUNTER — Other Ambulatory Visit (INDEPENDENT_AMBULATORY_CARE_PROVIDER_SITE_OTHER)

## 2024-05-19 DIAGNOSIS — Z79899 Other long term (current) drug therapy: Secondary | ICD-10-CM

## 2024-05-19 DIAGNOSIS — F3175 Bipolar disorder, in partial remission, most recent episode depressed: Secondary | ICD-10-CM

## 2024-05-19 DIAGNOSIS — F319 Bipolar disorder, unspecified: Secondary | ICD-10-CM | POA: Diagnosis not present

## 2024-05-19 DIAGNOSIS — F1991 Other psychoactive substance use, unspecified, in remission: Secondary | ICD-10-CM

## 2024-05-19 NOTE — Progress Notes (Signed)
 Pt tolerated venipuncture well in right arm with no complaints.    JNL, CMA

## 2024-05-19 NOTE — Addendum Note (Signed)
 Addended by: EZZARD WOODFIN SAILOR on: 05/19/2024 11:03 AM   Modules accepted: Orders

## 2024-05-20 LAB — HEPATITIS C ANTIBODY: Hep C Virus Ab: REACTIVE — AB

## 2024-05-23 LAB — CBC WITH DIFFERENTIAL/PLATELET
Basophils Absolute: 0.1 x10E3/uL (ref 0.0–0.2)
Basos: 1 %
EOS (ABSOLUTE): 0.1 x10E3/uL (ref 0.0–0.4)
Eos: 2 %
Hematocrit: 44.6 % (ref 37.5–51.0)
Hemoglobin: 15 g/dL (ref 13.0–17.7)
Immature Grans (Abs): 0 x10E3/uL (ref 0.0–0.1)
Immature Granulocytes: 0 %
Lymphocytes Absolute: 2.6 x10E3/uL (ref 0.7–3.1)
Lymphs: 45 %
MCH: 30.5 pg (ref 26.6–33.0)
MCHC: 33.6 g/dL (ref 31.5–35.7)
MCV: 91 fL (ref 79–97)
Monocytes Absolute: 0.5 x10E3/uL (ref 0.1–0.9)
Monocytes: 9 %
Neutrophils Absolute: 2.5 x10E3/uL (ref 1.4–7.0)
Neutrophils: 43 %
Platelets: 190 x10E3/uL (ref 150–450)
RBC: 4.91 x10E6/uL (ref 4.14–5.80)
RDW: 13.4 % (ref 11.6–15.4)
WBC: 5.8 x10E3/uL (ref 3.4–10.8)

## 2024-05-23 LAB — LIPID PANEL

## 2024-05-23 LAB — COMPREHENSIVE METABOLIC PANEL WITH GFR

## 2024-05-23 LAB — HEMOGLOBIN A1C
Est. average glucose Bld gHb Est-mCnc: 123 mg/dL
Hgb A1c MFr Bld: 5.9 % — ABNORMAL HIGH (ref 4.8–5.6)

## 2024-05-23 LAB — TSH

## 2024-05-26 ENCOUNTER — Telehealth (HOSPITAL_COMMUNITY): Payer: Self-pay | Admitting: Student

## 2024-05-26 NOTE — Telephone Encounter (Signed)
 Called patient to discuss positive hepatitis C antibody.  Answered his questions.  He says that he has not gotten a call from internal medicine center clinic.  Referral was placed about 2 weeks ago.  I gave him the phone number to follow-up with them.  Discussed follow-up appointment with new resident psychiatrist in August.

## 2024-06-07 ENCOUNTER — Telehealth (HOSPITAL_COMMUNITY): Payer: Self-pay | Admitting: Student

## 2024-06-07 NOTE — Telephone Encounter (Signed)
 06/07/2024 10:06 am KB  Spoke with Rollene at [facility]. Dr. Marry had initiated a referral to them and put Internal Medicine. Margaret called to let us  know that the referral had been put in incorrectly and saw it only because the patient called to check on the status of the referral. Patient had called earlier (around 9:55 am) to [facility] to find out about the referral and I directed him to [facility] and gave him the number.  Rollene said the referral needs to be corrected to indicate that [facility] is not internal medicine. He has Hep C so she can get him scheduled, but if we really want him to go to internal medicine, the referral needs to go somewhere else.  Margaret's number is [phone].

## 2024-06-07 NOTE — Telephone Encounter (Signed)
 06/07/2024 10:24 am - Patient called and said that an additional referral needs to go to [facility] in order to treat the Hep C. He is under the impression that the referral to internal medicine is to check for his medication levels, mentioning Latuda  specifically.  Thank you.

## 2024-06-08 ENCOUNTER — Other Ambulatory Visit (HOSPITAL_COMMUNITY): Payer: Self-pay | Admitting: Student

## 2024-06-08 DIAGNOSIS — F1991 Other psychoactive substance use, unspecified, in remission: Secondary | ICD-10-CM

## 2024-06-08 DIAGNOSIS — R768 Other specified abnormal immunological findings in serum: Secondary | ICD-10-CM

## 2024-06-08 NOTE — Progress Notes (Signed)
 Referral to [facility] for Hep C Ab+

## 2024-06-21 NOTE — Progress Notes (Deleted)
 BH MD Outpatient Progress Note  06/21/2024 11:13 AM COTTRELL GENTLES  MRN:  [id number]  Assessment:  George Brady presents for follow-up evaluation.   The patient reports some depressive symptoms as a result of significant psychosocial stressors.  Given situational component of symptoms, no medication changes planned for today.  Previously the patient has declined psychotherapy, but today he is more agreeable.  I would like for him to start therapy with Adam.  Unfortunately, the patient has significant transportation difficulties and the initial appointment has to be in person.  As the patient is already scheduled to come to clinic in person for lab draw and to establish with next resident provider, will plan to further discuss ability to engage in therapy at next appointment. Referral placed for internal medicine center.   Identifying Information: George Brady is a 58 y.o. y.o. male with a history of bipolar affective disorder who is an established patient with [facility] for management of depression.  He has been established with the clinic for several years.  Plan:  # Bipolar affective disorder in partial remission, most recent episode depressed Previous med trials: Abilify ineffective, Zyprexa significant weight gain, Seroquel cognitive slowing Interventions: - Continue Latuda  20 mg p.o. daily with food  -- Done on 05/2024- A1c: 5.9 Lipid panel: WNL -- AIMS 0 on 03/09/2024 - Continue lamotrigine  100 mg daily - Continue Remeron  15 mg nightly for appetite and mood  # History of illicit drug use - Patient reports sobriety for the past several decades  Patient was given contact information for behavioral health clinic and was instructed to call 911 for emergencies.   Subjective:  Chief Complaint:  No chief complaint on file.   Interval History:  Patient seen ***.  Patient reports feeling *** today. Since the previous visit, ***. Regarding  medications, patient notes ***. Patient reports the following adverse effects: ***.   Patient reports *** sleep, ***. Patient reports *** appetite, ***.   Patient rates anxiety a ***/10, depression a ***/10, and anger a ***/10.   Patient denies current SI, HI, and AVH. ***  Stressors include ***.   Substance use: ***   *** Forclosure on house? 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    Past Psychiatric History: No history of inpatient psychiatric admissions or self-harm behavior.  The patient does report remote history of severe substance use problems, which included IVDU.  Past Medical History:  Past Medical History:  Diagnosis Date   Hepatitis C    Manic depressive disorder (HCC)     Past Surgical History:  Procedure Laterality Date   ABDOMINAL SURGERY      Family Psychiatric History: None pertinent  Family History: No family history on file.  Social History:  Social History   Socioeconomic History   Marital status: Married    Spouse name: Not on file   Number of children: Not on file   Years of education: Not on file   Highest education level: Not on file  Occupational History   Not on file  Tobacco Use   Smoking status: Every Day    Current packs/day: 0.50    Types: Cigarettes   Smokeless tobacco: Never  Substance and Sexual Activity   Alcohol use: Yes    Comment: occasional   Drug use: Never   Sexual activity: Not on file  Other Topics Concern   Not on file  Social History Narrative   Not  on file   Social Drivers of Health   Financial Resource Strain: Not on file  Food Insecurity: Not on file  Transportation Needs: Not on file  Physical Activity: Not on file  Stress: Not on file  Social Connections: Not on file    Allergies: Not on File  Current Medications: Current Outpatient Medications  Medication Sig Dispense Refill   benztropine (COGENTIN) 0.5 MG tablet Take 1 tablet by mouth  daily.     buPROPion  (WELLBUTRIN  SR) 150 MG 12 hr tablet Take 150 mg by mouth daily.     cariprazine  (VRAYLAR ) 1.5 MG capsule Take 1.5 mg by mouth daily.     gabapentin  (NEURONTIN ) 400 MG capsule Take 1 capsule by mouth 3 (three) times daily.     lamoTRIgine  (LAMICTAL ) 100 MG tablet Take 1 tablet (100 mg total) by mouth daily. 30 tablet 2   lurasidone  (LATUDA ) 20 MG TABS tablet Take 1 tablet (20 mg total) by mouth daily. 30 tablet 2   mirtazapine  (REMERON ) 15 MG tablet Take 1 tablet (15 mg total) by mouth at bedtime. 30 tablet 2   traZODone  (DESYREL ) 50 MG tablet Take 50 mg by mouth.     No current facility-administered medications for this visit.     Objective: Psychiatric Specialty Exam: General Appearance: appears at stated age, casually dressed and groomed ***  Behavior: pleasant and cooperative ***  Psychomotor Activity: no psychomotor agitation or retardation noted ***  Eye Contact: fair *** Speech: normal amount, volume and fluency ***   Mood: euthymic *** Affect: congruent, pleasant and interactive ***  Thought Process: linear, goal directed, no circumstantial or tangential thought process noted, no racing thoughts or flight of ideas *** Descriptions of Associations: intact ***  Thought Content Hallucinations: denies AH, VH , does not appear responding to stimuli *** Delusions: no paranoia, delusions of control, grandeur, ideas of reference, thought broadcasting, and magical thinking *** Suicidal Thoughts: denies SI, intention, plan *** Homicidal Thoughts: denies HI, intention, plan ***  Alertness/Orientation: alert and fully oriented ***  Insight: fair*** Judgment: fair***  Memory: intact ***  Executive Functions  Concentration: intact *** Attention Span: fair *** Recall: intact *** Fund of Knowledge: fair ***  Physical Exam *** General: Pleasant, well-appearing ***. No acute distress. Pulmonary: Normal effort. No wheezing or rales. Skin: No obvious rash  or lesions. Neuro: A&Ox3.No focal deficit.  Review of Systems *** No reported symptoms   Metabolic Disorder Labs: Lab Results  Component Value Date   HGBA1C 5.9 (H) 05/19/2024   No results found for: PROLACTIN Lab Results  Component Value Date   CHOL CANCELED 05/19/2024   TRIG CANCELED 05/19/2024   HDL CANCELED 05/19/2024   CHOLHDL CANCELED 05/19/2024   LDLCALC CANCELED 05/19/2024   Lab Results  Component Value Date   TSH CANCELED 05/19/2024    Therapeutic Level Labs: No results found for: LITHIUM No results found for: VALPROATE No results found for: CBMZ  Screenings: GAD-7    Flowsheet Row Video Visit [facility] 02/20/2022 in [facility] Video Visit from 11/21/2021 in [facility] Office Visit from 04/03/2021 in [facility] Office Visit from 01/31/2021 in [facility]  Total GAD-7 Score 6 2 2 3    PHQ2-9    Flowsheet Row Video Visit from 02/20/2022 in [facility] Video Visit from 11/21/2021 in [facility] Video Visit from 07/03/2021 in [facility] Office Visit from 04/03/2021  in [facility] Office Visit from 01/31/2021 in [facility]  PHQ-2 Total Score 3 3 0 1 0  PHQ-9 Total Score 11 10 -- -- --   Flowsheet Row Video Visit from 02/20/2022 in [facility] Video Visit from 11/21/2021 in [facility] Video Visit from 07/03/2021 in [facility]  C-SSRS RISK CATEGORY Low Risk Low Risk Low Risk    Ismael Franco, MD PGY-3 Psychiatry Resident

## 2024-06-22 ENCOUNTER — Other Ambulatory Visit (HOSPITAL_COMMUNITY): Payer: Self-pay

## 2024-06-22 ENCOUNTER — Telehealth: Payer: Self-pay

## 2024-06-22 NOTE — Telephone Encounter (Signed)
 [facility] Patient Advocate Encounter ? ?Insurance verification completed.   ? ?The patient is uninsured and will need patient assistance for medication. ? ?We can complete the application and will need to meet with the patient for signatures and income documentation. ? ?Clearance Coots, CPhT ?Specialty Pharmacy Patient Advocate ?[facility] ?Phone: [phone] ?Fax:  [phone]  ?

## 2024-06-23 ENCOUNTER — Ambulatory Visit: Payer: Self-pay | Admitting: Student in an Organized Health Care Education/Training Program

## 2024-06-23 ENCOUNTER — Encounter: Payer: Self-pay | Admitting: Student in an Organized Health Care Education/Training Program

## 2024-06-23 VITALS — BP 133/89 | HR 83 | Wt 142.0 lb

## 2024-06-23 DIAGNOSIS — F1990 Other psychoactive substance use, unspecified, uncomplicated: Secondary | ICD-10-CM | POA: Insufficient documentation

## 2024-06-23 DIAGNOSIS — R5383 Other fatigue: Secondary | ICD-10-CM

## 2024-06-23 DIAGNOSIS — F3132 Bipolar disorder, current episode depressed, moderate: Secondary | ICD-10-CM

## 2024-06-23 DIAGNOSIS — R7303 Prediabetes: Secondary | ICD-10-CM | POA: Insufficient documentation

## 2024-06-23 DIAGNOSIS — N486 Induration penis plastica: Secondary | ICD-10-CM

## 2024-06-23 DIAGNOSIS — R634 Abnormal weight loss: Secondary | ICD-10-CM

## 2024-06-23 DIAGNOSIS — B182 Chronic viral hepatitis C: Secondary | ICD-10-CM

## 2024-06-23 LAB — CBC
HCT: 46.5 % (ref 39.0–52.0)
Hemoglobin: 15.8 g/dL (ref 13.0–17.0)
MCHC: 34 g/dL (ref 30.0–36.0)
MCV: 90.7 fl (ref 78.0–100.0)
Platelets: 192 K/uL (ref 150.0–400.0)
RBC: 5.13 Mil/uL (ref 4.22–5.81)
RDW: 14.3 % (ref 11.5–15.5)
WBC: 5.6 K/uL (ref 4.0–10.5)

## 2024-06-23 LAB — LIPID PANEL
Cholesterol: 155 mg/dL (ref 0–200)
HDL: 44.9 mg/dL (ref >39.00)
LDL Cholesterol: 95 mg/dL (ref 0–99)
NonHDL: 110.48
Total CHOL/HDL Ratio: 3
Triglycerides: 75 mg/dL (ref 0.0–149.0)
VLDL: 15 mg/dL (ref 0.0–40.0)

## 2024-06-23 LAB — TSH: TSH: 1.83 u[IU]/mL (ref 0.35–5.50)

## 2024-06-23 LAB — IBC + FERRITIN
Ferritin: 128.9 ng/mL (ref 22.0–322.0)
Iron: 180 ug/dL — ABNORMAL HIGH (ref 42–165)
Saturation Ratios: 49.6 % (ref 20.0–50.0)
TIBC: 362.6 ug/dL (ref 250.0–450.0)
Transferrin: 259 mg/dL (ref 212.0–360.0)

## 2024-06-23 LAB — VITAMIN B12: Vitamin B-12: 393 pg/mL (ref 211–911)

## 2024-06-23 NOTE — Assessment & Plan Note (Signed)
 Inactive status, awaiting evaluation at [facility] clinic. He is concerned about treatment cost and liver damage and is seeking Medicaid assistance. Refer to Child psychotherapist for assistance with Medicaid application and financial resources for hepatitis C treatment.

## 2024-06-23 NOTE — Assessment & Plan Note (Signed)
 Managed by behavioral health with recent medication changes. He reports stress and insomnia and desires non-pharmacological interventions. Financial and social stressors are present. Continue mirtazapine  and Latuda . Refer to Child psychotherapist for additional support and resources.

## 2024-06-23 NOTE — Progress Notes (Signed)
 New Patient Office Visit  Subjective    Patient ID: George Iodice., male    DOB: 05/17/1966  Age: 58 y.o. MRN: [id number]  CC:   Chief Complaint  Patient presents with   Establish Care    Fatigue, physical weakness and no energy.     HPI   Discussed the use of AI scribe software for clinical note transcription with the patient, who gave verbal consent to proceed.  History of Present Illness George Brady is a 58 year old male with bipolar disorder and hepatitis C who presents with overwhelming stress and fatigue.  He is experiencing overwhelming stress due to personal and financial difficulties, including impending foreclosure of his home and job instability. He feels unable to console his wife and is concerned about his future. Significant fatigue is present.  He has a history of bipolar disorder, currently managed by behavioral health services. Approximately six months ago, his medication regimen changed from Wellbutrin  to mirtazapine  and Latuda , which he feels has been beneficial. However, he is frustrated with the focus on medication rather than addressing the root causes of his mental health issues.  He has hepatitis C, diagnosed as inactive, with an upcoming appointment at the [facility] clinic for further evaluation. He is concerned about the potential progression of the disease, especially given his friend's experience with cirrhosis.  Significant fatigue has been present over the past few years, worsening in the last three years. He reports a lack of appetite, eating only once a day for the past six months, and significant weight loss, currently weighing 143 pounds down from 165 pounds.  He experiences insomnia, which he manages with mirtazapine , and notes a significant difference in his ability to cope with stress when unable to take his medication.  He reports a recent development of Peyronie's disease, noting a bend in his penis during erections, which he finds  disturbing despite it not causing pain.  He has a history of knee injury from childhood, which continues to cause pain and instability, particularly when lifting his leg.   Outpatient Encounter Medications as of 06/23/2024  Medication Sig   [DISCONTINUED] benztropine (COGENTIN) 0.5 MG tablet Take 1 tablet by mouth daily.   [DISCONTINUED] buPROPion  (WELLBUTRIN  SR) 150 MG 12 hr tablet Take 150 mg by mouth daily.   [DISCONTINUED] cariprazine  (VRAYLAR ) 1.5 MG capsule Take 1.5 mg by mouth daily.   [DISCONTINUED] gabapentin  (NEURONTIN ) 400 MG capsule Take 1 capsule by mouth 3 (three) times daily.   [DISCONTINUED] traZODone  (DESYREL ) 50 MG tablet Take 50 mg by mouth.   lamoTRIgine  (LAMICTAL ) 100 MG tablet Take 1 tablet (100 mg total) by mouth daily.   lurasidone  (LATUDA ) 20 MG TABS tablet Take 1 tablet (20 mg total) by mouth daily.   mirtazapine  (REMERON ) 15 MG tablet Take 1 tablet (15 mg total) by mouth at bedtime.   No facility-administered encounter medications on file as of 06/23/2024.    Past Medical History:  Diagnosis Date   Hepatitis C    Manic depressive disorder (HCC)     Past Surgical History:  Procedure Laterality Date   ABDOMINAL SURGERY      Family History  Problem Relation Age of Onset   Other Father    Stroke Father    Parkinson's disease Maternal Grandmother    Dementia Paternal Grandmother    Dementia Paternal Grandfather        Objective    BP 133/89   Pulse 83   Wt 142 lb (64.4  kg)   BMI 21.59 kg/m   Physical Exam  Gen: Chronically ill-appearing man Mouth: Upper and lower dentures in place Neck: Normal thyroid, no nodules or adenopathy Heart: Regular, no murmur Lungs: unlabored, clear throughout Abd: Soft, thin, no organomegaly Genitals: Normal-appearing circumcised penis, no skin lesions around the penis, no fibrosis, very mild curvature to the left Ext: Warm, no edema Psych: Anxious appearing, pressured speech is moderate, speech is moderately  disorganized, moderate flight of ideas, answers direct questions, follows instructions and can be reoriented, not responding to internal stimuli      Assessment & Plan:    Problem List Items Addressed This Visit       High   Hepatitis C virus infection (Chronic)   Inactive status, awaiting evaluation at [facility] clinic. He is concerned about treatment cost and liver damage and is seeking Medicaid assistance. Refer to Child psychotherapist for assistance with Medicaid application and financial resources for hepatitis C treatment.      Relevant Orders   CBC   Bipolar disorder, current episode depressed, moderate (HCC) - Primary (Chronic)   Managed by behavioral health with recent medication changes. He reports stress and insomnia and desires non-pharmacological interventions. Financial and social stressors are present. Continue mirtazapine  and Latuda . Refer to Child psychotherapist for additional support and resources.      Relevant Orders   AMB Referral [facility] Care Management     Medium    Substance use disorder (Chronic)     Low   Fatigue (Chronic)   Chronic fatigue with weight loss and poor appetite, likely due to nutritional deficiencies. Suspected low iron and B12 levels. Order blood tests to evaluate thyroid function, iron levels, and B12 levels.  Very likely to be multifactorial issues like bipolar disease worsened by numerous negative social determinants of health.      Relevant Orders   TSH   Vitamin B12   IBC + Ferritin   Unintentional weight loss (Chronic)   Relevant Orders   Lipid panel   TSH   Peyronie's disease (Chronic)   Penile curvature noted, no pain but concerned about quality of life impact.       Return in about 3 months (around 09/23/2024).   Cleatus Debby Specking, MD

## 2024-06-23 NOTE — Patient Instructions (Signed)
  VISIT SUMMARY: During today's visit, we discussed your overwhelming stress and fatigue, as well as your ongoing management of bipolar disorder and hepatitis C. We also addressed your recent weight loss, poor appetite, and concerns about Peyronie's disease and chronic knee pain.  YOUR PLAN: -BIPOLAR DISORDER: Bipolar disorder is a mental health condition characterized by extreme mood swings. You will continue with your current medications, mirtazapine  and Latuda , and we will refer you to a social worker for additional support and resources to help manage your stress and insomnia.  -CHRONIC HEPATITIS C, INACTIVE: Hepatitis C is a liver infection caused by the hepatitis C virus. Your condition is currently inactive, and you have an upcoming evaluation at the [facility]. We will refer you to a social worker to assist with your Medicaid application and to find financial resources for your treatment.  -FATIGUE WITH UNINTENTIONAL WEIGHT LOSS AND POOR APPETITE: Your fatigue, weight loss, and poor appetite may be due to nutritional deficiencies. We will order blood tests to check your thyroid function, iron levels, and B12 levels to identify any deficiencies.  -PEYRONIE'S DISEASE: Peyronie's disease is a condition where scar tissue causes the penis to bend during erections. Although it is not causing pain, we understand your concern about its impact on your quality of life.  -CHRONIC RIGHT KNEE PAIN: Your chronic knee pain is likely due to past trauma and degenerative changes. This pain and instability are affecting your daily activities.  INSTRUCTIONS: Please follow up with the social worker for additional support and resources for managing your stress, Medicaid application, and financial resources for hepatitis C treatment. Additionally, complete the blood tests to evaluate your thyroid function, iron levels, and B12 levels as soon as possible.

## 2024-06-23 NOTE — Assessment & Plan Note (Signed)
 Penile curvature noted, no pain but concerned about quality of life impact.

## 2024-06-23 NOTE — Assessment & Plan Note (Signed)
 Chronic fatigue with weight loss and poor appetite, likely due to nutritional deficiencies. Suspected low iron and B12 levels. Order blood tests to evaluate thyroid function, iron levels, and B12 levels.  Very likely to be multifactorial issues like bipolar disease worsened by numerous negative social determinants of health.

## 2024-06-24 ENCOUNTER — Ambulatory Visit: Payer: Self-pay | Admitting: Student in an Organized Health Care Education/Training Program

## 2024-06-24 ENCOUNTER — Telehealth: Payer: Self-pay | Admitting: *Deleted

## 2024-06-24 NOTE — Progress Notes (Signed)
 Complex Care Management Note Care Guide Note  06/24/2024 Name: George Brady [facility]. MRN: [id number] DOB: 03/07/1966   Complex Care Management Outreach Attempts: An unsuccessful telephone outreach was attempted today to offer the patient information about available complex care management services.  Follow Up Plan:  Additional outreach attempts will be made to offer the patient complex care management information and services.   Encounter Outcome:  No Answer  Thedford Franks, CMA [place]  [facility], [facility] Guide Direct Dial: [phone]  Fax: [phone] Website: [email or web address]

## 2024-06-25 NOTE — Progress Notes (Signed)
 Complex Care Management Note Care Guide Note  06/25/2024 Name: George Brady [facility]. MRN: [id number] DOB: September 22, 1966   Complex Care Management Outreach Attempts: A second unsuccessful outreach was attempted today to offer the patient with information about available complex care management services.  Follow Up Plan:  Additional outreach attempts will be made to offer the patient complex care management information and services.   Encounter Outcome:  No Answer  Thedford Franks, CMA [place]  [facility], [facility] Guide Direct Dial: [phone]  Fax: [phone] Website: [email or web address]

## 2024-06-28 NOTE — Progress Notes (Signed)
 Complex Care Management Note Care Guide Note  06/28/2024 Name: George Brady [facility]. MRN: [id number] DOB: February 01, 1966   Complex Care Management Outreach Attempts: A third unsuccessful outreach was attempted today to offer the patient with information about available complex care management services.  Follow Up Plan:  No further outreach attempts will be made at this time. We have been unable to contact the patient to offer or enroll patient in complex care management services.  Encounter Outcome:  No Answer  Harlene Satterfield  [facility]  [facility], [facility] Guide  Direct Dial: [phone]  Fax [phone]

## 2024-06-29 ENCOUNTER — Encounter (HOSPITAL_COMMUNITY): Admitting: Psychiatry

## 2024-06-29 ENCOUNTER — Encounter (HOSPITAL_COMMUNITY): Payer: Self-pay

## 2024-06-30 ENCOUNTER — Other Ambulatory Visit: Payer: Self-pay

## 2024-06-30 ENCOUNTER — Encounter: Payer: Self-pay | Admitting: Family

## 2024-06-30 ENCOUNTER — Ambulatory Visit: Payer: Self-pay | Admitting: Family

## 2024-06-30 VITALS — BP 131/74 | HR 79 | Temp 97.9°F | Ht 68.0 in | Wt 140.0 lb

## 2024-06-30 DIAGNOSIS — B182 Chronic viral hepatitis C: Secondary | ICD-10-CM

## 2024-06-30 NOTE — Assessment & Plan Note (Signed)
 George Brady has a positive Hepatitis C antibody test with concern for Hepatitis C infection with risk factors of history of drug use, tattoos, and age. Treatment naive and experiencing fatigue as the only symptom. Discussed the basics of Hepatitis C including transmission, risk if left untreated, lab work, treatment options including side effects, available financial assistance, and plan of care.  Currently uninsured and will need financial assistance. Will check Hepatitis C RNA level to determine presence of infection. If negative no additional treatment needed. If positive will need additional lab work which he is agreeable to with plan discussed. Follow up pending lab work results.

## 2024-06-30 NOTE — Progress Notes (Signed)
 Subjective:   Patient ID: George Brady., male    DOB: 04/26/1966, 58 y.o.   MRN: [id number]  Chief Complaint  Patient presents with   New Patient (Initial Visit)    (+) HCV Ab    HPI:  George Brady. is a 58 y.o. male with previous medical history depression, bipolar disorder, and injection drug use presenting today for initial evaluation of positive Hepatitis C testing.  George Brady was recently seen in [facility] and found to have a positive Hepatitis C antibody on 05/19/24. Initially diagnosed with Hepatitis C in around 2008 with risk factors that include history of injection drug use, tattoos and age (born between [birth year] and 1967). Has not received treatment in the past. Currently experiencing fatigue and denies abdominal pain, nausea, vomiting, fever, scleral icterus or jaundice.  No personal or family history of liver disease. 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 Socially stressed secondary to currently being unemployed and is in the process of being evicted from his home.    No Known Allergies    Outpatient Medications Prior to Visit  Medication Sig Dispense Refill   lamoTRIgine  (LAMICTAL ) 100 MG tablet Take 1 tablet (100 mg total) by mouth daily. 30 tablet 2   lurasidone  (LATUDA ) 20 MG TABS tablet Take 1 tablet (20 mg total) by mouth daily. 30 tablet 2   mirtazapine  (REMERON ) 15 MG tablet Take 1 tablet (15 mg total) by mouth at bedtime. 30 tablet 2   No facility-administered medications prior to visit.     Past Medical History:  Diagnosis Date   Hepatitis C    Manic depressive disorder (HCC)    Substance abuse (HCC)      Past Surgical History:  Procedure Laterality Date   ABDOMINAL SURGERY         Review of Systems  Constitutional:  Positive for fatigue. Negative for chills, diaphoresis and fever.  Respiratory:  Negative for cough, chest tightness, shortness of breath and wheezing.   Cardiovascular:  Negative for  chest pain.  Gastrointestinal:  Negative for abdominal distention, abdominal pain, constipation, diarrhea, nausea and vomiting.  Neurological:  Negative for weakness and headaches.  Hematological:  Does not bruise/bleed easily.    Objective:   BP 131/74   Pulse 79   Temp 97.9 F (36.6 C) (Temporal)   Ht 5' 8 (1.727 m)   Wt 140 lb (63.5 kg)   SpO2 94%   BMI 21.29 kg/m  Nursing note and vital signs reviewed.  Physical Exam Constitutional:      General: He is not in acute distress.    Appearance: He is well-developed.  Cardiovascular:     Rate and Rhythm: Normal rate and regular rhythm.     Heart sounds: Normal heart sounds. No murmur heard.    No friction rub. No gallop.  Pulmonary:     Effort: Pulmonary effort is normal. No respiratory distress.     Breath sounds: Normal breath sounds. No wheezing or rales.  Chest:     Chest wall: No tenderness.  Abdominal:     General: Bowel sounds are normal. There is no distension.     Palpations: Abdomen is soft. There is no mass.     Tenderness: There is no abdominal tenderness. There is no guarding or rebound.  Skin:    General: Skin is warm and dry.  Neurological:     Mental Status: He is alert and oriented to person, place, and time.  Psychiatric:  Speech: Speech is rapid and pressured.         02/20/2022    3:42 PM 11/21/2021    3:39 PM 07/03/2021    4:05 PM 04/03/2021    3:51 PM 01/31/2021    3:34 PM  Depression screen PHQ 2/9  Decreased Interest       Down, Depressed, Hopeless       PHQ - 2 Score       Altered sleeping       Tired, decreased energy       Change in appetite       Feeling bad or failure about yourself        Trouble concentrating       Moving slowly or fidgety/restless       Suicidal thoughts       PHQ-9 Score       Difficult doing work/chores          Information is confidential and restricted. Go to Review Flowsheets to unlock data.     Assessment & Plan:    Patient Active Problem  List   Diagnosis Date Noted   Substance use disorder 06/23/2024   Fatigue 06/23/2024   Unintentional weight loss 06/23/2024   Peyronie's disease 06/23/2024   Prediabetes 06/23/2024   Bipolar disorder, current episode depressed, moderate (HCC) 08/20/2019   Hepatitis C virus infection 07/01/2007     Problem List Items Addressed This Visit       Digestive   Hepatitis C virus infection - Primary (Chronic)   George Brady has a positive Hepatitis C antibody test with concern for Hepatitis C infection with risk factors of history of drug use, tattoos, and age. Treatment naive and experiencing fatigue as the only symptom. Discussed the basics of Hepatitis C including transmission, risk if left untreated, lab work, treatment options including side effects, available financial assistance, and plan of care.  Currently uninsured and will need financial assistance. Will check Hepatitis C RNA level to determine presence of infection. If negative no additional treatment needed. If positive will need additional lab work which he is agreeable to with plan discussed. Follow up pending lab work results.       Relevant Orders   Hepatitis C RNA quantitative     I am having George Brune. Mullan Jr. maintain his mirtazapine , lurasidone , and lamoTRIgine .   Follow-up: Pending lab work if needed or sooner if needed.   Cathlyn July, MSN, FNP-C Nurse Practitioner [facility] Main number: [phone]

## 2024-06-30 NOTE — Patient Instructions (Addendum)
 Nice to see you.  We will check your lab work today.  If your test is positive we will arrange additional lab work and if negative no additional treatment is needed.   Plan for follow up pending lab work results.   Have a great day and stay safe!  Limit acetaminophen  (Tylenol ) usage to no more than 2 grams (2,000 mg) per day.  Avoid alcohol.  Do not share toothbrushes or razors.  Practice safe sex to protect against transmission as well as sexually transmitted disease.    Hepatitis C Hepatitis C is a viral infection of the liver. It can lead to scarring of the liver (cirrhosis), liver failure, or liver cancer. Hepatitis C may go undetected for months or years because people with the infection may not have symptoms, or they may have only mild symptoms. What are the causes? This condition is caused by the hepatitis C virus (HCV). The virus can spread from person to person (is contagious) through: Blood. Childbirth. A woman who has hepatitis C can pass it to her baby during birth. Bodily fluids, such as breast milk, tears, semen, vaginal fluids, and saliva. Blood transfusions or organ transplants done in the [place]  before 1992.  What increases the risk? The following factors may make you more likely to develop this condition: Having contact with unclean (contaminated) needles or syringes. This may result from: Acupuncture. Tattoing. Body piercing. Injecting drugs. Having unprotected sex with someone who is infected. Needing treatment to filter your blood (kidney dialysis). Having HIV (human immunodeficiency virus) or AIDS (acquired immunodeficiency syndrome). Working in a job that involves contact with blood or bodily fluids, such as health care.  What are the signs or symptoms? Symptoms of this condition include: Fatigue. Loss of appetite. Nausea. Vomiting. Abdominal pain. Dark yellow urine. Yellowish skin and eyes (jaundice). Itchy skin. Clay-colored bowel  movements. Joint pain. Bleeding and bruising easily. Fluid building up in your stomach (ascites).  In some cases, you may not have any symptoms. How is this diagnosed? This condition is diagnosed with: Blood tests. Other tests to check how well your liver is functioning. They may include: Magnetic resonance elastography (MRE). This imaging test uses MRIs and sound waves to measure liver stiffness. Transient elastography. This imaging test uses ultrasounds to measure liver stiffness. Liver biopsy. This test requires taking a small tissue sample from your liver to examine it under a microscope.  How is this treated? Your health care provider may perform noninvasive tests or a liver biopsy to help decide the best course of treatment. Treatment may include: Antiviral medicines and other medicines. Follow-up treatments every 6-12 months for infections or other liver conditions. Receiving a donated liver (liver transplant).  Follow these instructions at home: Medicines Take over-the-counter and prescription medicines only as told by your health care provider. Take your antiviral medicine as told by your health care provider. Do not stop taking the antiviral even if you start to feel better. Do not take any medicines unless approved by your health care provider, including over-the-counter medicines and birth control pills. Activity Rest as needed. Do not have sex unless approved by your health care provider. Ask your health care provider when you may return to school or work. Eating and drinking Eat a balanced diet with plenty of fruits and vegetables, whole grains, and lowfat (lean) meats or non-meat proteins (such as beans or tofu). Drink enough fluids to keep your urine clear or pale yellow. Do not drink alcohol. General instructions Do not share  toothbrushes, nail clippers, or razors. Wash your hands frequently with soap and water. If soap and water are not available, use hand  sanitizer. Cover any cuts or open sores on your skin to prevent spreading the virus. Keep all follow-up visits as told by your health care provider. This is important. You may need follow-up visits every 6-12 months. How is this prevented? There is no vaccine for hepatitis C. The only way to prevent the disease is to reduce the risk of exposure to the virus. Make sure you: Wash your hands frequently with soap and water. If soap and water are not available, use hand sanitizer. Do not share needles or syringes. Practice safe sex and use condoms. Avoid handling blood or bodily fluids without gloves or other protection. Avoid getting tattoos or piercings in shops or other locations that are not clean.  Contact a health care provider if: You have a fever. You develop abdominal pain. You pass dark urine. You pass clay-colored stools. You develop joint pain. Get help right away if: You have increasing fatigue or weakness. You lose your appetite. You cannot eat or drink without vomiting. You develop jaundice or your jaundice gets worse. You bruise or bleed easily. Summary Hepatitis C is a viral infection of the liver. It can lead to scarring of the liver (cirrhosis), liver failure, or liver cancer. The hepatitis C virus (HCV) causes this condition. The virus can pass from person to person (is contagious). You should not take any medicines unless approved by your health care provider. This includes over-the-counter medicines and birth control pills. This information is not intended to replace advice given to you by your health care provider. Make sure you discuss any questions you have with your health care provider. Document Released: 11/01/2000 Document Revised: 12/10/2016 Document Reviewed: 12/10/2016 Elsevier Interactive Patient Education  [employer].

## 2024-07-02 LAB — HEPATITIS C RNA QUANTITATIVE
HCV Quantitative Log: 6.93 {Log_IU}/mL — ABNORMAL HIGH
HCV RNA, PCR, QN: 8510000 [IU]/mL — ABNORMAL HIGH

## 2024-07-12 ENCOUNTER — Ambulatory Visit: Payer: Self-pay | Admitting: Family

## 2024-07-12 DIAGNOSIS — B182 Chronic viral hepatitis C: Secondary | ICD-10-CM

## 2024-09-23 ENCOUNTER — Ambulatory Visit: Payer: Self-pay | Admitting: Student in an Organized Health Care Education/Training Program

## 2024-10-21 ENCOUNTER — Ambulatory Visit (HOSPITAL_COMMUNITY): Admitting: Physician Assistant

## 2024-10-21 ENCOUNTER — Encounter (HOSPITAL_COMMUNITY): Payer: Self-pay | Admitting: Physician Assistant

## 2024-10-21 VITALS — BP 119/82 | HR 49 | Ht 68.0 in | Wt 144.6 lb

## 2024-10-21 DIAGNOSIS — R7689 Other specified abnormal immunological findings in serum: Secondary | ICD-10-CM

## 2024-10-21 DIAGNOSIS — F3175 Bipolar disorder, in partial remission, most recent episode depressed: Secondary | ICD-10-CM | POA: Diagnosis not present

## 2024-10-21 MED ORDER — LURASIDONE HCL 20 MG PO TABS: 20.0000 mg | ORAL_TABLET | Freq: Every day | ORAL | 1 refills | Status: DC

## 2024-10-21 MED ORDER — LAMOTRIGINE 100 MG PO TABS: 100.0000 mg | ORAL_TABLET | Freq: Every day | ORAL | 1 refills | Status: DC

## 2024-10-21 MED ORDER — MIRTAZAPINE 15 MG PO TABS: 15.0000 mg | ORAL_TABLET | Freq: Every day | ORAL | 1 refills | Status: DC

## 2024-10-21 NOTE — Progress Notes (Unsigned)
 BH MD/PA/NP OP Progress Note  10/21/2024 11:00 AM [employer].  MRN:  [id number]  Chief Complaint:  Chief Complaint  Patient presents with   Follow-up   Medication Refill   HPI:   George Brady. is a 58 year old male with a past psychiatric history significant for bipolar disorder (in partial remission, most recent episode depressed) who presents to [facility] for follow-up and medication management.  Patient was last seen by Dr. Marry on 05/11/2024.  During his last encounter, patient was being managed on the following psychiatric medications:  Lamotrigine  100 mg daily Remeron  15 mg at bedtime Latuda  20 mg daily with food  Patient presents to the encounter stating that he has been taking his medications regularly and denies experiencing any adverse side effects.  Patient reports that his depression has been mostly manageable.  He does endorse feeling down over having to recently move from his previous place of residence.  Patient denies recent episodes of mania nor does he endorse anxiety. Patient's main stressor revolves around recently losing his home due to foreclosure and having car troubles.  A PHQ 2 screen was performed with the patient scoring a 1.  A GAD-7 screen was also performed with the patient scoring a 2.  Patient is alert and oriented x 4, pleasant, calm, cooperative, and fully engaged in conversation during the encounter.  Patient endorses good mood.  Patient exhibits euthymic mood with appropriate affect.  Patient denies suicidal or homicidal ideations.  He further denies auditory or visual hallucinations and does not appear to be responding to internal/external stimuli.  Patient endorses fair sleep and receives on average 7 hours of sleep per night.  Patient reports that his sleep has been off recently.  Patient endorses decreased appetite but states that he has been trying to eat more.  Patient denies alcohol  consumption or illicit drug use.  Patient denies tobacco use stating that he quit cigarettes 3 years ago.  He reports that he does smoke a cigar every once in a while.  Visit Diagnosis:    ICD-10-CM   1. Hepatitis C antibody positive  R76.89 Ambulatory referral to Infectious Disease    2. Bipolar disorder, in partial remission, most recent episode depressed (HCC)  F31.75 lamoTRIgine  (LAMICTAL ) 100 MG tablet    mirtazapine  (REMERON ) 15 MG tablet    lurasidone  (LATUDA ) 20 MG TABS tablet      Past Psychiatric History:  No history of inpatient psychiatric admissions or self-harm behavior. The patient does report remote history of severe substance use problems, which included IVDU.   Past Medical History:  Past Medical History:  Diagnosis Date   Hepatitis C    Manic depressive disorder (HCC)    Substance abuse (HCC)     Past Surgical History:  Procedure Laterality Date   ABDOMINAL SURGERY      Family Psychiatric History:  None pertinent   Family History:  Family History  Problem Relation Age of Onset   Other Father    Stroke Father    Parkinson's disease Maternal Grandmother    Dementia Paternal Grandmother    Dementia Paternal Grandfather     Social History:  Social History   Socioeconomic History   Marital status: Married    Spouse name: Not on file   Number of children: Not on file   Years of education: Not on file   Highest education level: Not on file  Occupational History   Not on file  Tobacco Use   Smoking status: Every Day    Current packs/day: 0.50    Types: Cigars, Cigarettes   Smokeless tobacco: Never   Tobacco comments:    Smokes cigars occasionally  Substance and Sexual Activity   Alcohol use: Yes    Comment: about 3 beers daily   Drug use: Not Currently    Types: Heroin   Sexual activity: Yes    Partners: Female  Other Topics Concern   Not on file  Social History Narrative   Not on file   Social Drivers of Health   Financial Resource  Strain: Not on file  Food Insecurity: Not on file  Transportation Needs: Not on file  Physical Activity: Not on file  Stress: Not on file  Social Connections: Not on file    Allergies: No Known Allergies  Metabolic Disorder Labs: Lab Results  Component Value Date   HGBA1C 5.9 (H) 05/19/2024   No results found for: PROLACTIN Lab Results  Component Value Date   CHOL 155 06/23/2024   TRIG 75.0 06/23/2024   HDL 44.90 06/23/2024   CHOLHDL 3 06/23/2024   VLDL 15.0 06/23/2024   LDLCALC 95 06/23/2024   LDLCALC CANCELED 05/19/2024   Lab Results  Component Value Date   TSH 1.83 06/23/2024   TSH CANCELED 05/19/2024    Therapeutic Level Labs: No results found for: LITHIUM No results found for: VALPROATE No results found for: CBMZ  Current Medications: Current Outpatient Medications  Medication Sig Dispense Refill   lamoTRIgine  (LAMICTAL ) 100 MG tablet Take 1 tablet (100 mg total) by mouth daily. 90 tablet 1   lurasidone  (LATUDA ) 20 MG TABS tablet Take 1 tablet (20 mg total) by mouth daily. 90 tablet 1   mirtazapine  (REMERON ) 15 MG tablet Take 1 tablet (15 mg total) by mouth at bedtime. 90 tablet 1   No current facility-administered medications for this visit.     Musculoskeletal: Strength & Muscle Tone: within normal limits Gait & Station: normal Patient leans: N/A  Psychiatric Specialty Exam: Review of Systems  Psychiatric/Behavioral:  Positive for sleep disturbance. Negative for decreased concentration, dysphoric mood, hallucinations, self-injury and suicidal ideas. The patient is not nervous/anxious and is not hyperactive.     Blood pressure 119/82, pulse (!) 49, height 5' 8 (1.727 m), weight 144 lb 9.6 oz (65.6 kg), SpO2 100%.Body mass index is 21.99 kg/m.  General Appearance: Casual  Eye Contact:  Good  Speech:  Clear and Coherent and Normal Rate  Volume:  Normal  Mood:  Euthymic  Affect:  Appropriate  Thought Process:  Coherent, Goal Directed, and  Descriptions of Associations: Intact  Orientation:  Full (Time, Place, and Person)  Thought Content: WDL   Suicidal Thoughts:  No  Homicidal Thoughts:  No  Memory:  Immediate;   Good Recent;   Good Remote;   Good  Judgement:  Good  Insight:  Good  Psychomotor Activity:  Normal  Concentration:  Concentration: Good and Attention Span: Good  Recall:  Good  Fund of Knowledge: Good  Language: Good  Akathisia:  No  Handed:  Not assessed  AIMS (if indicated): done; 0  Assets:  Communication Skills Desire for Improvement Financial Resources/Insurance Housing Leisure Time Physical Health Social Support Transportation Vocational/Educational  ADL's:  Intact  Cognition: WNL  Sleep:  Fair   Screenings: AIMS    Flowsheet Row Clinical Support from 10/21/2024 in [facility]  AIMS Total Score 0   GAD-7    Flowsheet Row Clinical  Support from 10/21/2024 in [facility] Video Visit from 02/20/2022 in [facility] Video Visit from 11/21/2021 in [facility] Office Visit from 04/03/2021 in [facility] Office Visit from 01/31/2021 in [facility]  Total GAD-7 Score 2 6 2 2 3    PHQ2-9    Flowsheet Row Clinical Support from 10/21/2024 in [facility] Video Visit from 02/20/2022 in [facility] Video Visit from 11/21/2021 in [facility] Video Visit from 07/03/2021 in [facility] Office Visit from 04/03/2021 in [facility]  PHQ-2 Total Score 1 3 3  0 1  PHQ-9 Total Score -- 11 10 -- --   Flowsheet Row Clinical Support from 10/21/2024 in [facility] Video Visit from 02/20/2022 in [facility] Video Visit from 11/21/2021 in [facility]  C-SSRS RISK CATEGORY Moderate Risk Low Risk Low Risk     Assessment and Plan:   Nhat Hearne. is a 58 year old male with a past psychiatric history significant for bipolar disorder (in partial remission, most recent episode depressed) who presents to [facility] for follow-up and medication management.  Patient was last seen by Dr. Marry on 05/11/2024.  During his last encounter, patient was being managed on the following psychiatric medications:  Lamotrigine  100 mg daily Remeron  15 mg at bedtime Latuda  20 mg daily with food  Patient presents to the encounter stating that he continues to take his medications regularly and denies experiencing any adverse side effects.  An aims assessment was performed with the patient scoring a 0.  Patient denies experiencing stiffness, rigidity, or involuntary movements from his use of Latuda .  Patient denies overt depressive symptoms but states that he has been down lately due to his home recently being foreclosed on.  He denies anxiety at this time.  A PHQ 2 screen was performed with the patient scoring a 1. A GAD-7 screen was also performed with the patient scoring a 2.  Patient endorses stability and would like to continue taking his medications as prescribed.  Patient's medications to be e-prescribed to pharmacy of choice.  Patient's labs are up-to-date.  Patient's lipid panel (06/23/2024) was within normal limits.  Patient CBC (06/23/2024) was within normal limits.  Patient's hemoglobin A1c (05/19/2024) was elevated at 5.9%.  Patient to be set up with an EKG appointment in the near future.  Patient to be referred to infectious diseases for the management of his hepatitis C.  A [place] Suicide Severity Rating Scale was performed with the patient being considered moderate risk.  Patient denies suicidal ideations and is able to contract for safety at this time.  Safety planning was discussed  with the patient prior to the conclusion of the encounter.  - Patient was instructed to contact 911 in the event of a mental health crisis. - Patient was instructed to contact [facility] [employer] [facility] in the event of a mental health crisis. - Patient was instructed to present to [facility] in the event of a mental health crisis.  Collaboration of Care: Collaboration of Care: Medication Management AEB provider managing patient's psychiatric medications, Psychiatrist AEB patient being followed by mental health provider at this facility, and Other provider involved in patient's care AEB provider to refer patient to infectious diseases  Patient/Guardian was  advised Release of Information must be obtained prior to any record release in order to collaborate their care with an outside provider. Patient/Guardian was advised if they have not already done so to contact the registration department to sign all necessary forms in order for us  to release information regarding their care.   Consent: Patient/Guardian gives verbal consent for treatment and assignment of benefits for services provided during this visit. Patient/Guardian expressed understanding and agreed to proceed.   1. Bipolar disorder, in partial remission, most recent episode depressed (HCC)  - lamoTRIgine  (LAMICTAL ) 100 MG tablet; Take 1 tablet (100 mg total) by mouth daily.  Dispense: 90 tablet; Refill: 1 - mirtazapine  (REMERON ) 15 MG tablet; Take 1 tablet (15 mg total) by mouth at bedtime.  Dispense: 90 tablet; Refill: 1 - lurasidone  (LATUDA ) 20 MG TABS tablet; Take 1 tablet (20 mg total) by mouth daily.  Dispense: 90 tablet; Refill: 1  2. Hepatitis C antibody positive (Primary)  - Ambulatory referral to Infectious Disease  Patient to follow-up in 6 weeks with Daniela B. Izella, MD Provider spent a total of 45 minutes with the patient/reviewing patient's chart  Reginia FORBES Bolster, PA 10/21/2024,  11:00 AM

## 2024-11-03 ENCOUNTER — Encounter: Payer: Self-pay | Admitting: Emergency Medicine

## 2024-11-03 ENCOUNTER — Ambulatory Visit
Admission: EM | Admit: 2024-11-03 | Discharge: 2024-11-03 | Disposition: A | Discharge status: Home / Self Care | Source: Home / Self Care

## 2024-11-03 DIAGNOSIS — I10 Essential (primary) hypertension: Secondary | ICD-10-CM

## 2024-11-03 DIAGNOSIS — K047 Periapical abscess without sinus: Secondary | ICD-10-CM

## 2024-11-03 MED ORDER — AMOXICILLIN-POT CLAVULANATE 875-125 MG PO TABS: 1.0000 | ORAL_TABLET | Freq: Two times a day (BID) | ORAL | 0 refills | Status: AC

## 2024-11-03 NOTE — Discharge Instructions (Addendum)
 Take the Augmentin  twice daily with food for 10 days for treatment of your dental infection.  Use over-the-counter Tylenol and ibuprofen for swelling and mild to moderate pain.  Rinse with warm salt water, or Listerine, after each meal to remove food particles and wash away any pus that is collecting.  If you develop any increasing or swelling, fever, pain, or difficulty swallowing you to go to the emergency department at [facility] with a have an oral surgeon and also a dentist on-call.   With regards to your blood pressure.  Start taking the lisinopril  10 mg once daily for management your blood pressure.  I have a suggest that you stop smoking as well as this will help decrease your blood pressure.  Purchase a blood pressure cuff and measure your blood pressure at home periodically and record the values.  When you take your blood pressure make sure that you have been sitting in a relaxed state for at least 30 minutes, you are sitting in a chair with your feet flat on the floor, your bladder is empty, and the arm you are taking a blood pressure reading and is at heart level for at least 5 minutes.  Take the reading and record it.  Take this list of blood pressure readings to your appointment to establish care with a PCP that we have helped you to set up prior to discharge.  If you develop any headache, changes in vision, dizziness, chest pain, shortness of breath I recommend you call 911 and go to the ER.

## 2024-11-03 NOTE — ED Triage Notes (Signed)
 Pt presents with left side dental pain and facial swelling since yesterday. Pt took some old abx and OTC pain medication.

## 2024-11-03 NOTE — ED Provider Notes (Signed)
 [facility]    CSN: [id number] Arrival date & time: 11/03/24  1018      History   Chief Complaint Chief Complaint  Patient presents with   Dental Pain    HPI Scott Pennington is a 58 y.o. male.   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  She took some leftover Bactrim which causes itching and throat tightness.  She denies any drainage from around the tooth, fever, chest pain, shortness breath, headache, or dizziness.  History reviewed. No pertinent past medical history.  There are no active problems to display for this patient.   History reviewed. No pertinent surgical history.  OB History   No obstetric history on file.      Home Medications    Prior to Admission medications  Medication Sig Start Date End Date Taking? Authorizing Provider  amoxicillin -clavulanate (AUGMENTIN ) 875-125 MG tablet Take 1 tablet by mouth every 12 (twelve) hours for 10 days. 11/03/24 11/13/24 Yes Bernardino Ditch, NP  aspirin EC 81 MG tablet Take 81 mg by mouth daily. Swallow whole.   Yes [provider]  lisinopril  (ZESTRIL ) 10 MG tablet Take 1 tablet (10 mg total) by mouth daily. 11/03/24  Yes Bernardino Ditch, NP    Family History History reviewed. No pertinent family history.  Social History Social History[1]   Allergies   Patient has no known allergies.   Review of Systems Review of Systems  Constitutional:  Negative for fever.  HENT:  Positive for dental problem and facial swelling.   Respiratory:  Negative for shortness of breath.   Cardiovascular:  Negative for chest pain.  Neurological:  Negative for dizziness and headaches.     Physical Exam Triage Vital Signs ED Triage Vitals  Encounter Vitals Group     BP      Girls Systolic BP Percentile      Girls Diastolic BP Percentile      Boys Systolic BP Percentile       Boys Diastolic BP Percentile      Pulse      Resp      Temp      Temp src      SpO2      Weight      Height      Head Circumference      Peak Flow      Pain Score      Pain Loc      Pain Education      Exclude from Growth Chart    No data found.  Updated Vital Signs BP (!) 184/118 (BP Location: Right Arm)   Pulse 81   Temp 98.3 F (36.8 C) (Oral)   Resp 17   Wt 216 lb (98 kg)   LMP 10/27/2024 (Approximate)   SpO2 97%   Visual Acuity Right Eye Distance:   Left Eye Distance:   Bilateral Distance:    Right Eye Near:   Left Eye Near:    Bilateral Near:     Physical Exam Vitals and nursing note reviewed.  Constitutional:      Appearance: Normal appearance. She is not ill-appearing.  HENT:     Head: Normocephalic and atraumatic.     Mouth/Throat:     Mouth: Mucous membranes are moist.     Pharynx: Oropharynx is clear. Posterior oropharyngeal erythema present. No oropharyngeal exudate.  Comments: Poor dentition globally with erythema and edema of the gum tissue on the left. Cardiovascular:     Rate and Rhythm: Normal rate and regular rhythm.     Pulses: Normal pulses.     Heart sounds: Normal heart sounds. No murmur heard.    No friction rub. No gallop.  Pulmonary:     Effort: Pulmonary effort is normal.     Breath sounds: Normal breath sounds. No wheezing, rhonchi or rales.  Skin:    General: Skin is warm and dry.     Capillary Refill: Capillary refill takes less than 2 seconds.     Findings: No rash.  Neurological:     General: No focal deficit present.     Mental Status: She is alert and oriented to person, place, and time.      UC Treatments / Results  Labs (all labs ordered are listed, but only abnormal results are displayed) Labs Reviewed - No data to display  EKG Normal sinus rhythm with a ventricular rate of 77 bpm PR interval 146 ms QRS duration 92 ms QT/QTc 388/439 ms No ST or T wave abnormalities noted.  Radiology No results  found.  Procedures Procedures (including critical care time)  Medications Ordered in UC Medications - No data to display  Initial Impression / Assessment and Plan / UC Course  I have reviewed the triage vital signs and the nursing notes.  Pertinent labs & imaging results that were available during my care of the patient were reviewed by me and considered in my medical decision making (see chart for details).   Patient is a pleasant, nontoxic-appearing 58 year old male presenting for evaluation of left upper dental pain and left maxillary facial swelling that began yesterday.  At triage her blood pressure was noted to be 184/118.  She reports that she has had elevated blood pressure readings without definite diagnosis of hypertension and she has never been on any medication.  She did have an MI 5 years ago and was seen at [facility].  I am unable to view those records.  She reports that she did not follow-up following her MRI and did not continue taking her medications as she had a difficult social situation at that time.  She is not endorsing any headache, dizziness, chest pain, or shortness of breath at present.  Her cardiopulmonary exam reveals S1-S2 heart sounds with regular rate and rhythm lung sounds that are close additional fields.  Due to the patient's elevated blood pressure I will obtain an EKG to evaluate for any evidence of heart strain.  With regards to the patient's dental pain, you can see that the patient's canine and first molar are broken off at the gumline and there is marked decay of the lateral incisors bilaterally.  I do not appreciate any exudate.  There is mild swelling to the patient's maxillary region.  I will start her on Augmentin  875 mg twice daily with food for 10 days.  I have suggested that she follow-up with either [facility] or [facility] here in [place].  I will also give her a list of dental resources.  I will have staff assist her in setting up a  primary care provider.  If her EKG is unremarkable I will discharge her home on lisinopril  for management of her blood pressure until she can get in and be seen by PCP.  Patient is EKG shows normal sinus rhythm without any ST or T wave abnormalities suggesting acute cardiac event.  I will therefore discharge her home on lisinopril  10 mg daily for blood pressure management.  I will also suggest that she stop smoking as this will also help decrease her blood pressure.  Staff will set her up with a PCP.  As mentioned above, Augmentin  for dental infection with a list of dental resources provided.  Work note provided.   Final Clinical Impressions(s) / UC Diagnoses   Final diagnoses:  Dental infection  Essential hypertension     Discharge Instructions      Take the Augmentin  twice daily with food for 10 days for treatment of your dental infection.  Use over-the-counter Tylenol and ibuprofen for swelling and mild to moderate pain.  Rinse with warm salt water, or Listerine, after each meal to remove food particles and wash away any pus that is collecting.  If you develop any increasing or swelling, fever, pain, or difficulty swallowing you to go to the emergency department at [facility] with a have an oral surgeon and also a dentist on-call.   With regards to your blood pressure.  Start taking the lisinopril  10 mg once daily for management your blood pressure.  I have a suggest that you stop smoking as well as this will help decrease your blood pressure.  Purchase a blood pressure cuff and measure your blood pressure at home periodically and record the values.  When you take your blood pressure make sure that you have been sitting in a relaxed state for at least 30 minutes, you are sitting in a chair with your feet flat on the floor, your bladder is empty, and the arm you are taking a blood pressure reading and is at heart level for at least 5 minutes.  Take the reading and record  it.  Take this list of blood pressure readings to your appointment to establish care with a PCP that we have helped you to set up prior to discharge.  If you develop any headache, changes in vision, dizziness, chest pain, shortness of breath I recommend you call 911 and go to the ER.     ED Prescriptions     Medication Sig Dispense Auth. Provider   amoxicillin -clavulanate (AUGMENTIN ) 875-125 MG tablet Take 1 tablet by mouth every 12 (twelve) hours for 10 days. 20 tablet Bernardino Ditch, NP   lisinopril  (ZESTRIL ) 10 MG tablet Take 1 tablet (10 mg total) by mouth daily. 30 tablet Bernardino Ditch, NP      I have reviewed the PDMP during this encounter.     [1]  Social History Tobacco Use   Smoking status: Never   Smokeless tobacco: Never  Vaping Use   Vaping status: Some Days  Substance Use Topics   Alcohol use: Yes    Comment: social   Drug use: Not Currently     Bernardino Ditch, NP 11/03/24 1141

## 2024-11-22 NOTE — Progress Notes (Signed)
 BH MD Outpatient Progress Note  12/03/2024 8:38 AM George Lin Bluelinx.  MRN:  [id number]  Assessment:  George Brady [facility] presents for follow-up evaluation, previously seen by Dr. Marry and Lytle Bolster PA.   Since the last visit, depressive symptoms remain stable, while anxiety persists in the context of significant psychosocial stressors including homelessness, unstable employment, limited family support, and financial strain. He endorses prominent hopelessness related to these circumstances but reports that current medications continue to help with depression and irritability. He denies relapse to illicit substance use and appears to be in a maintenance stage of change. No acute safety concerns identified today.  Overall presentation is most consistent with depressive and anxiety symptoms driven largely by ongoing psychosocial adversity rather than medication inefficacy. Current medications are appropriate to continue at current doses given reported benefit and stability. Patient declines psychotherapy at this time due to feeling overwhelmed by active stressors, which was explored and respected.  Will continue to monitor mood, anxiety, and safety closely, and will revisit therapy and social support interventions when psychosocial burden becomes more manageable.  Identifying Information: George Brady. is a 59 y.o. y.o. male with a history of bipolar affective disorder who is an established patient with [facility] for management of depression.  He has been established with the clinic for several years.  Plan:  # Bipolar affective disorder in partial remission, most recent episode depressed Previous med trials: Abilify ineffective, Zyprexa significant weight gain, Seroquel cognitive slowing Interventions: - Continue Lamictal  100 mg daily - Continue Remeron  15 mg nightly for appetite and mood - Continue Latuda  20 mg p.o. daily with food   -  AIMS 0 on 03/09/2024  #Long term use of antipsychotic medication -- Established with the internal medicine center - Antipsychotic monitoring labs: lipid panel (06/23/2024) was within normal limits. CBC (06/23/2024) was within normal limits. Patient's hemoglobin A1c (05/19/2024) was elevated at 5.9%, TSH (06/2024) WNL   # History of illicit drug use Interventions: - Patient reports sobriety for the past several decades  Patient was given contact information for behavioral health clinic and was instructed to call 911 for emergencies.   Subjective:  Chief Complaint: med mgmt  Interval History:  Patient seen alone.  Patient reports feeling good today.  Regarding psychiatric symptoms, he does feel hopelessness because of his stressors. Patient reports the medications are helpful specifically his depression, irritability and anxiety but he does feel discouraged because of his financial situation. He wants to go back to school to try to find a more stable job like in civil engineer, contracting. He states his wife is an international aid/development worker at [employer].   Stressors include finances. He states that he has not been working until recently. 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 He lost his house to for closure and him and his wife are staying wherever they could and currently living in his car.  Patient reports good sleep. Patient reports good appetite.   Patient denies current SI, HI, and AVH.   Substance use: Tobacco: denies Alcohol: denies Illicit substances: denies  Past Psychiatric History:  No history of inpatient psychiatric admissions or self-harm behavior.  The patient does report remote history of severe substance use problems, which included IVDU.  Past Medical History:  Past Medical History:  Diagnosis Date   Hepatitis C    Manic depressive disorder (HCC)    Substance abuse (HCC)     Past Surgical History:  Procedure Laterality Date   ABDOMINAL SURGERY       Family Psychiatric History: None pertinent  Family History:  Family History  Problem Relation Age of Onset   Other Father    Stroke Father    Parkinson's disease Maternal Grandmother    Dementia Paternal Grandmother    Dementia Paternal Grandfather     Social History:  Social History   Socioeconomic History   Marital status: Married    Spouse name: Not on file   Number of children: Not on file   Years of education: Not on file   Highest education level: Not on file  Occupational History   Not on file  Tobacco Use   Smoking status: Every Day    Current packs/day: 0.50    Types: Cigars, Cigarettes   Smokeless tobacco: Never   Tobacco comments:    Smokes cigars occasionally  Substance and Sexual Activity   Alcohol use: Yes    Comment: about 3 beers daily   Drug use: Not Currently    Types: Heroin   Sexual activity: Yes    Partners: Female  Other Topics Concern   Not on file  Social History Narrative   Not on file   Social Drivers of Health   Tobacco Use: High Risk (10/21/2024)   Patient History    Smoking Tobacco Use: Every Day    Smokeless Tobacco Use: Never    Passive Exposure: Not on file  Financial Resource Strain: Not on file  Food Insecurity: Not on file  Transportation Needs: Not on file  Physical Activity: Not on file  Stress: Not on file  Social Connections: Not on file  Depression (PHQ2-9): Low Risk (10/21/2024)   Depression (PHQ2-9)    PHQ-2 Score: 1  Alcohol Screen: Not on file  Housing: Not on file  Utilities: Not on file  Health Literacy: Not on file    Allergies: No Known Allergies  Current Medications: Current Outpatient Medications  Medication Sig Dispense Refill   lamoTRIgine  (LAMICTAL ) 100 MG tablet Take 1 tablet (100 mg total) by mouth daily. 90 tablet 1   lurasidone  (LATUDA ) 20 MG TABS tablet Take 1 tablet (20 mg total) by mouth daily. 90 tablet 1   mirtazapine  (REMERON ) 15 MG tablet Take 1 tablet (15 mg total) by mouth at  bedtime. 90 tablet 1   No current facility-administered medications for this visit.     Objective:  Psychiatric Specialty Exam: General Appearance: appears at stated age, casually dressed and groomed   Behavior: pleasant and cooperative   Psychomotor Activity: no psychomotor agitation or retardation noted   Eye Contact: fair  Speech: normal amount, volume and fluency    Mood: good Affect: incongruent, appears depressed  Thought Process: linear, goal directed, no circumstantial or tangential thought process noted, no racing thoughts or flight of ideas  Descriptions of Associations: intact   Thought Content Hallucinations: denies AH, VH , does not appear responding to stimuli  Delusions: no paranoia, delusions of control, grandeur, ideas of reference, thought broadcasting, and magical thinking  Suicidal Thoughts: denies SI, intention, plan  Homicidal Thoughts: denies HI, intention, plan   Alertness/Orientation: alert and fully oriented   Insight: fair Judgment: fair  Memory: intact   Executive Functions  Concentration: intact  Attention Span: fair  Recall: intact  Fund of Knowledge: fair   Physical Exam  General: Pleasant, well-appearing. No acute distress. Pulmonary: Normal effort. No wheezing or rales. Skin: No obvious rash or lesions. Neuro: A&Ox3.No focal deficit.  Review of Systems  No reported symptoms   Metabolic Disorder Labs: Lab Results  Component Value Date   HGBA1C 5.9 (H) 05/19/2024   No results found for: PROLACTIN Lab Results  Component Value Date   CHOL 155 06/23/2024   TRIG 75.0 06/23/2024   HDL 44.90 06/23/2024   CHOLHDL 3 06/23/2024   VLDL 15.0 06/23/2024   LDLCALC 95 06/23/2024   LDLCALC CANCELED 05/19/2024   Lab Results  Component Value Date   TSH 1.83 06/23/2024   TSH CANCELED 05/19/2024    Therapeutic Level Labs: No results found for: LITHIUM No results found for: VALPROATE No results found for:  CBMZ  Screenings: AIMS    Flowsheet Row Clinical Support from 10/21/2024 in [facility]  AIMS Total Score 0   GAD-7    Flowsheet Row Clinical Support from 10/21/2024 in [facility] Video Visit from 02/20/2022 in [facility] Video Visit from 11/21/2021 in [facility] Office Visit from 04/03/2021 in [facility] Office Visit from 01/31/2021 in [facility]  Total GAD-7 Score 2 6 2 2 3    PHQ2-9    Flowsheet Row Clinical Support from 10/21/2024 in [facility] Video Visit from 02/20/2022 in [facility] Video Visit from 11/21/2021 in [facility] Video Visit from 07/03/2021 in [facility] Office Visit from 04/03/2021 in [facility]  PHQ-2 Total Score 1 3 3  0 1  PHQ-9 Total Score -- 11 10 -- --   Flowsheet Row Clinical Support from 10/21/2024 in [facility] Video Visit from 02/20/2022 in [facility] Video Visit from 11/21/2021 in [facility]  C-SSRS RISK CATEGORY Moderate Risk Low Risk Low Risk    George Franco, MD PGY-3 Psychiatry Resident

## 2024-11-25 ENCOUNTER — Encounter (HOSPITAL_COMMUNITY): Admitting: Psychiatry

## 2024-12-03 ENCOUNTER — Encounter: Payer: Self-pay | Admitting: Infectious Diseases

## 2024-12-03 ENCOUNTER — Other Ambulatory Visit: Payer: Self-pay

## 2024-12-03 ENCOUNTER — Ambulatory Visit (INDEPENDENT_AMBULATORY_CARE_PROVIDER_SITE_OTHER): Admitting: Psychiatry

## 2024-12-03 ENCOUNTER — Other Ambulatory Visit (HOSPITAL_COMMUNITY): Payer: Self-pay

## 2024-12-03 DIAGNOSIS — F3175 Bipolar disorder, in partial remission, most recent episode depressed: Secondary | ICD-10-CM | POA: Diagnosis not present

## 2024-12-03 DIAGNOSIS — F3132 Bipolar disorder, current episode depressed, moderate: Secondary | ICD-10-CM | POA: Diagnosis not present

## 2024-12-03 MED ORDER — MIRTAZAPINE 15 MG PO TABS: 15.0000 mg | ORAL_TABLET | Freq: Every day | ORAL | 0 refills | Status: AC

## 2024-12-03 MED ORDER — LAMOTRIGINE 100 MG PO TABS: 100.0000 mg | ORAL_TABLET | Freq: Every day | ORAL | 0 refills | Status: AC

## 2024-12-03 MED ORDER — LURASIDONE HCL 20 MG PO TABS: 20.0000 mg | ORAL_TABLET | Freq: Every day | ORAL | 0 refills | Status: AC

## 2024-12-13 ENCOUNTER — Telehealth: Payer: Self-pay

## 2024-12-13 ENCOUNTER — Other Ambulatory Visit (HOSPITAL_COMMUNITY): Payer: Self-pay

## 2024-12-13 NOTE — Telephone Encounter (Signed)
 [facility] Patient Advocate Encounter ? ?Insurance verification completed.   ? ?The patient is uninsured and will need patient assistance for medication. ? ?We can complete the application and will need to meet with the patient for signatures and income documentation. ? ?Arland Hutchinson, CPhT ?Specialty Pharmacy Patient Advocate ?[facility] ?Phone: [phone] ?Fax:  [phone]  ?

## 2024-12-16 ENCOUNTER — Encounter: Payer: Self-pay | Admitting: Internal Medicine

## 2025-01-05 ENCOUNTER — Encounter: Payer: Self-pay | Admitting: Internal Medicine

## 2025-02-25 ENCOUNTER — Encounter (HOSPITAL_COMMUNITY): Admitting: Psychiatry
